# Patient Record
Sex: Male | Born: 1940 | Race: White | Hispanic: No | Smoking: Former smoker
Health system: Southern US, Community
[De-identification: ages and names within clinical notes are randomized; demographics above are authoritative.]

## PROBLEM LIST (undated history)

## (undated) DIAGNOSIS — I255 Ischemic cardiomyopathy: Secondary | ICD-10-CM

## (undated) DIAGNOSIS — I10 Essential (primary) hypertension: Secondary | ICD-10-CM

## (undated) DIAGNOSIS — E039 Hypothyroidism, unspecified: Secondary | ICD-10-CM

## (undated) DIAGNOSIS — Z95810 Presence of automatic (implantable) cardiac defibrillator: Secondary | ICD-10-CM

## (undated) DIAGNOSIS — M1990 Unspecified osteoarthritis, unspecified site: Secondary | ICD-10-CM

## (undated) DIAGNOSIS — Z8719 Personal history of other diseases of the digestive system: Secondary | ICD-10-CM

## (undated) DIAGNOSIS — N189 Chronic kidney disease, unspecified: Secondary | ICD-10-CM

## (undated) DIAGNOSIS — T82198A Other mechanical complication of other cardiac electronic device, initial encounter: Secondary | ICD-10-CM

## (undated) DIAGNOSIS — I679 Cerebrovascular disease, unspecified: Secondary | ICD-10-CM

## (undated) DIAGNOSIS — I480 Paroxysmal atrial fibrillation: Secondary | ICD-10-CM

## (undated) DIAGNOSIS — I5022 Chronic systolic (congestive) heart failure: Secondary | ICD-10-CM

## (undated) DIAGNOSIS — I499 Cardiac arrhythmia, unspecified: Secondary | ICD-10-CM

## (undated) DIAGNOSIS — C4320 Malignant melanoma of unspecified ear and external auricular canal: Secondary | ICD-10-CM

## (undated) DIAGNOSIS — I639 Cerebral infarction, unspecified: Secondary | ICD-10-CM

## (undated) DIAGNOSIS — R001 Bradycardia, unspecified: Secondary | ICD-10-CM

## (undated) DIAGNOSIS — I2510 Atherosclerotic heart disease of native coronary artery without angina pectoris: Secondary | ICD-10-CM

## (undated) DIAGNOSIS — IMO0001 Reserved for inherently not codable concepts without codable children: Secondary | ICD-10-CM

## (undated) DIAGNOSIS — I219 Acute myocardial infarction, unspecified: Secondary | ICD-10-CM

## (undated) DIAGNOSIS — E785 Hyperlipidemia, unspecified: Secondary | ICD-10-CM

## (undated) DIAGNOSIS — I739 Peripheral vascular disease, unspecified: Secondary | ICD-10-CM

## (undated) DIAGNOSIS — D759 Disease of blood and blood-forming organs, unspecified: Secondary | ICD-10-CM

## (undated) DIAGNOSIS — I472 Ventricular tachycardia, unspecified: Secondary | ICD-10-CM

## (undated) DIAGNOSIS — G4730 Sleep apnea, unspecified: Secondary | ICD-10-CM

## (undated) HISTORY — PX: CAROTID ENDARTERECTOMY: SUR193

## (undated) HISTORY — DX: Other mechanical complication of other cardiac electronic device, initial encounter: T82.198A

## (undated) HISTORY — DX: Disease of blood and blood-forming organs, unspecified: D75.9

## (undated) HISTORY — DX: Bradycardia, unspecified: R00.1

## (undated) HISTORY — PX: BACK SURGERY: SHX140

## (undated) HISTORY — DX: Ischemic cardiomyopathy: I25.5

## (undated) HISTORY — DX: Essential (primary) hypertension: I10

## (undated) HISTORY — PX: LUMBAR DISC SURGERY: SHX700

## (undated) HISTORY — DX: Unspecified osteoarthritis, unspecified site: M19.90

## (undated) HISTORY — PX: REVASCULARIZATION / IN-SITU GRAFT LEG: SUR1270

## (undated) HISTORY — PX: CARDIAC CATHETERIZATION: SHX172

## (undated) HISTORY — PX: CARDIAC DEFIBRILLATOR PLACEMENT: SHX171

## (undated) HISTORY — DX: Hyperlipidemia, unspecified: E78.5

## (undated) HISTORY — DX: Chronic systolic (congestive) heart failure: I50.22

## (undated) HISTORY — DX: Hypothyroidism, unspecified: E03.9

## (undated) HISTORY — PX: CORONARY ANGIOPLASTY WITH STENT PLACEMENT: SHX49

## (undated) HISTORY — PX: EYE SURGERY: SHX253

## (undated) HISTORY — DX: Ventricular tachycardia, unspecified: I47.20

## (undated) HISTORY — DX: Peripheral vascular disease, unspecified: I73.9

## (undated) HISTORY — DX: Ventricular tachycardia: I47.2

## (undated) HISTORY — DX: Cerebrovascular disease, unspecified: I67.9

## (undated) HISTORY — DX: Atherosclerotic heart disease of native coronary artery without angina pectoris: I25.10

## (undated) HISTORY — DX: Cerebral infarction, unspecified: I63.9

## (undated) HISTORY — DX: Acute myocardial infarction, unspecified: I21.9

---
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
## 1959-07-07 HISTORY — PX: TUMOR EXCISION: SHX421

## 1989-07-06 HISTORY — PX: ELBOW ARTHROSCOPY: SHX614

## 1989-07-06 HISTORY — PX: KNEE ARTHROSCOPY: SHX127

## 1989-07-06 HISTORY — PX: HEEL SPUR SURGERY: SHX665

## 1990-11-05 HISTORY — PX: CORONARY ANGIOPLASTY: SHX604

## 1998-11-05 HISTORY — PX: CATARACT EXTRACTION W/ INTRAOCULAR LENS  IMPLANT, BILATERAL: SHX1307

## 2001-11-05 HISTORY — PX: RENAL ARTERY BYPASS: SHX2318

## 2002-03-25 ENCOUNTER — Encounter: Payer: Self-pay | Admitting: Vascular Surgery

## 2002-03-26 ENCOUNTER — Encounter: Payer: Self-pay | Admitting: Nephrology

## 2002-03-26 ENCOUNTER — Ambulatory Visit (HOSPITAL_COMMUNITY): Admission: RE | Admit: 2002-03-26 | Discharge: 2002-03-27 | Payer: Self-pay | Admitting: Vascular Surgery

## 2002-04-06 ENCOUNTER — Encounter: Payer: Self-pay | Admitting: Vascular Surgery

## 2002-04-06 ENCOUNTER — Encounter: Admission: RE | Admit: 2002-04-06 | Discharge: 2002-04-06 | Payer: Self-pay | Admitting: Vascular Surgery

## 2002-04-22 ENCOUNTER — Inpatient Hospital Stay (HOSPITAL_COMMUNITY): Admission: AD | Admit: 2002-04-22 | Discharge: 2002-04-24 | Payer: Self-pay | Admitting: Cardiology

## 2002-05-03 ENCOUNTER — Encounter (INDEPENDENT_AMBULATORY_CARE_PROVIDER_SITE_OTHER): Payer: Self-pay | Admitting: *Deleted

## 2002-05-03 ENCOUNTER — Encounter: Payer: Self-pay | Admitting: Vascular Surgery

## 2002-05-03 ENCOUNTER — Inpatient Hospital Stay (HOSPITAL_COMMUNITY): Admission: RE | Admit: 2002-05-03 | Discharge: 2002-05-24 | Payer: Self-pay | Admitting: Vascular Surgery

## 2002-05-04 ENCOUNTER — Encounter: Payer: Self-pay | Admitting: Vascular Surgery

## 2002-05-05 ENCOUNTER — Encounter: Payer: Self-pay | Admitting: Vascular Surgery

## 2002-05-05 HISTORY — PX: FEMORAL-POPLITEAL BYPASS GRAFT: SHX937

## 2002-05-06 ENCOUNTER — Encounter: Payer: Self-pay | Admitting: Vascular Surgery

## 2002-05-19 ENCOUNTER — Encounter: Payer: Self-pay | Admitting: Vascular Surgery

## 2004-02-17 ENCOUNTER — Inpatient Hospital Stay (HOSPITAL_COMMUNITY): Admission: AD | Admit: 2004-02-17 | Discharge: 2004-02-17 | Payer: Self-pay | Admitting: Internal Medicine

## 2005-08-21 ENCOUNTER — Ambulatory Visit: Payer: Self-pay | Admitting: Internal Medicine

## 2005-11-23 ENCOUNTER — Ambulatory Visit: Payer: Self-pay | Admitting: Internal Medicine

## 2006-02-19 ENCOUNTER — Ambulatory Visit: Payer: Self-pay | Admitting: Internal Medicine

## 2006-04-08 ENCOUNTER — Ambulatory Visit: Payer: Self-pay

## 2006-07-10 ENCOUNTER — Ambulatory Visit: Payer: Self-pay | Admitting: Internal Medicine

## 2006-12-17 ENCOUNTER — Ambulatory Visit: Payer: Self-pay | Admitting: Internal Medicine

## 2007-03-11 ENCOUNTER — Ambulatory Visit: Payer: Self-pay | Admitting: Internal Medicine

## 2007-05-27 ENCOUNTER — Ambulatory Visit: Payer: Self-pay | Admitting: Vascular Surgery

## 2007-05-30 ENCOUNTER — Ambulatory Visit: Payer: Self-pay | Admitting: Vascular Surgery

## 2007-05-30 ENCOUNTER — Encounter: Payer: Self-pay | Admitting: Vascular Surgery

## 2007-05-30 ENCOUNTER — Inpatient Hospital Stay (HOSPITAL_COMMUNITY): Admission: RE | Admit: 2007-05-30 | Discharge: 2007-06-01 | Payer: Self-pay | Admitting: Vascular Surgery

## 2007-06-17 ENCOUNTER — Ambulatory Visit: Payer: Self-pay | Admitting: Vascular Surgery

## 2007-06-26 ENCOUNTER — Ambulatory Visit: Payer: Self-pay | Admitting: Internal Medicine

## 2007-09-09 ENCOUNTER — Ambulatory Visit: Payer: Self-pay | Admitting: Internal Medicine

## 2007-11-24 ENCOUNTER — Ambulatory Visit: Payer: Self-pay | Admitting: Vascular Surgery

## 2007-12-12 ENCOUNTER — Ambulatory Visit: Payer: Self-pay | Admitting: Internal Medicine

## 2008-03-09 ENCOUNTER — Ambulatory Visit: Payer: Self-pay | Admitting: Internal Medicine

## 2008-05-24 ENCOUNTER — Ambulatory Visit: Payer: Self-pay | Admitting: Vascular Surgery

## 2008-06-08 ENCOUNTER — Ambulatory Visit: Payer: Self-pay | Admitting: Internal Medicine

## 2008-09-07 ENCOUNTER — Ambulatory Visit: Payer: Self-pay | Admitting: Internal Medicine

## 2008-11-23 ENCOUNTER — Ambulatory Visit: Payer: Self-pay | Admitting: Vascular Surgery

## 2008-12-10 ENCOUNTER — Encounter (INDEPENDENT_AMBULATORY_CARE_PROVIDER_SITE_OTHER): Payer: Self-pay | Admitting: *Deleted

## 2009-03-11 ENCOUNTER — Encounter: Payer: Self-pay | Admitting: Internal Medicine

## 2009-03-14 ENCOUNTER — Ambulatory Visit: Payer: Self-pay | Admitting: Internal Medicine

## 2009-03-18 ENCOUNTER — Encounter: Payer: Self-pay | Admitting: Internal Medicine

## 2009-05-05 ENCOUNTER — Encounter: Payer: Self-pay | Admitting: Internal Medicine

## 2009-05-23 ENCOUNTER — Ambulatory Visit: Payer: Self-pay | Admitting: Vascular Surgery

## 2009-06-14 ENCOUNTER — Ambulatory Visit: Payer: Self-pay | Admitting: Internal Medicine

## 2009-06-20 ENCOUNTER — Encounter: Payer: Self-pay | Admitting: Internal Medicine

## 2009-09-01 ENCOUNTER — Ambulatory Visit: Payer: Self-pay | Admitting: Internal Medicine

## 2009-09-07 ENCOUNTER — Ambulatory Visit: Payer: Self-pay | Admitting: Internal Medicine

## 2009-09-07 DIAGNOSIS — N259 Disorder resulting from impaired renal tubular function, unspecified: Secondary | ICD-10-CM | POA: Insufficient documentation

## 2009-09-07 DIAGNOSIS — I472 Ventricular tachycardia: Secondary | ICD-10-CM | POA: Insufficient documentation

## 2009-09-15 ENCOUNTER — Encounter: Admission: RE | Admit: 2009-09-15 | Discharge: 2009-09-15 | Payer: Self-pay | Admitting: Cardiology

## 2009-09-16 ENCOUNTER — Ambulatory Visit: Payer: Self-pay | Admitting: Internal Medicine

## 2009-09-16 ENCOUNTER — Ambulatory Visit (HOSPITAL_COMMUNITY): Admission: RE | Admit: 2009-09-16 | Discharge: 2009-09-16 | Payer: Self-pay | Admitting: Cardiology

## 2009-10-03 ENCOUNTER — Telehealth: Payer: Self-pay | Admitting: Internal Medicine

## 2009-10-19 HISTORY — PX: CHOLECYSTECTOMY: SHX55

## 2009-11-05 HISTORY — PX: DOPPLER ECHOCARDIOGRAPHY: SHX263

## 2009-11-05 HISTORY — PX: FEMORAL-POPLITEAL BYPASS GRAFT: SHX937

## 2009-11-07 ENCOUNTER — Telehealth: Payer: Self-pay | Admitting: Internal Medicine

## 2009-11-07 ENCOUNTER — Encounter: Payer: Self-pay | Admitting: Internal Medicine

## 2009-11-29 ENCOUNTER — Ambulatory Visit: Payer: Self-pay | Admitting: Vascular Surgery

## 2009-12-13 ENCOUNTER — Ambulatory Visit: Payer: Self-pay | Admitting: Internal Medicine

## 2010-01-10 ENCOUNTER — Ambulatory Visit: Payer: Self-pay | Admitting: Vascular Surgery

## 2010-01-11 ENCOUNTER — Ambulatory Visit (HOSPITAL_COMMUNITY): Admission: RE | Admit: 2010-01-11 | Discharge: 2010-01-11 | Payer: Self-pay | Admitting: Vascular Surgery

## 2010-01-11 ENCOUNTER — Ambulatory Visit: Payer: Self-pay | Admitting: Vascular Surgery

## 2010-01-13 ENCOUNTER — Inpatient Hospital Stay (HOSPITAL_COMMUNITY): Admission: RE | Admit: 2010-01-13 | Discharge: 2010-01-17 | Payer: Self-pay | Admitting: Vascular Surgery

## 2010-01-16 ENCOUNTER — Encounter: Payer: Self-pay | Admitting: Vascular Surgery

## 2010-02-14 ENCOUNTER — Ambulatory Visit: Payer: Self-pay | Admitting: Vascular Surgery

## 2010-03-14 ENCOUNTER — Ambulatory Visit: Payer: Self-pay | Admitting: Internal Medicine

## 2010-03-21 ENCOUNTER — Encounter: Payer: Self-pay | Admitting: Internal Medicine

## 2010-06-27 ENCOUNTER — Ambulatory Visit: Payer: Self-pay | Admitting: Internal Medicine

## 2010-06-27 ENCOUNTER — Ambulatory Visit: Payer: Self-pay | Admitting: Vascular Surgery

## 2010-08-29 ENCOUNTER — Ambulatory Visit: Payer: Self-pay | Admitting: Vascular Surgery

## 2010-09-21 ENCOUNTER — Ambulatory Visit: Payer: Self-pay | Admitting: Cardiology

## 2010-09-21 ENCOUNTER — Encounter: Payer: Self-pay | Admitting: Internal Medicine

## 2010-09-28 ENCOUNTER — Ambulatory Visit: Payer: Self-pay | Admitting: Internal Medicine

## 2010-10-12 ENCOUNTER — Ambulatory Visit: Payer: Self-pay | Admitting: Vascular Surgery

## 2010-10-19 ENCOUNTER — Encounter (INDEPENDENT_AMBULATORY_CARE_PROVIDER_SITE_OTHER): Payer: Self-pay | Admitting: *Deleted

## 2010-10-24 ENCOUNTER — Ambulatory Visit: Payer: Self-pay | Admitting: Internal Medicine

## 2010-10-26 ENCOUNTER — Encounter: Payer: Self-pay | Admitting: Internal Medicine

## 2010-10-26 ENCOUNTER — Telehealth (INDEPENDENT_AMBULATORY_CARE_PROVIDER_SITE_OTHER): Payer: Self-pay | Admitting: *Deleted

## 2010-10-26 ENCOUNTER — Ambulatory Visit: Payer: Self-pay

## 2010-10-26 ENCOUNTER — Inpatient Hospital Stay (HOSPITAL_COMMUNITY)
Admission: AD | Admit: 2010-10-26 | Discharge: 2010-10-30 | Payer: Self-pay | Attending: Internal Medicine | Admitting: Internal Medicine

## 2010-10-26 LAB — CONVERTED CEMR LAB
CO2: 26 meq/L (ref 19–32)
Glucose, Bld: 94 mg/dL (ref 70–99)
Magnesium: 2.3 mg/dL (ref 1.5–2.5)
Troponin I: 0.04 ng/mL (ref <0.06)

## 2010-10-29 ENCOUNTER — Encounter: Payer: Self-pay | Admitting: Cardiology

## 2010-11-01 ENCOUNTER — Ambulatory Visit: Payer: Self-pay | Admitting: Internal Medicine

## 2010-11-01 ENCOUNTER — Telehealth: Payer: Self-pay | Admitting: Internal Medicine

## 2010-11-05 HISTORY — PX: CARDIAC ELECTROPHYSIOLOGY STUDY AND ABLATION: SHX1294

## 2010-11-13 ENCOUNTER — Ambulatory Visit: Payer: Self-pay | Admitting: Cardiology

## 2010-11-14 ENCOUNTER — Ambulatory Visit (HOSPITAL_COMMUNITY)
Admission: RE | Admit: 2010-11-14 | Discharge: 2010-11-14 | Payer: Self-pay | Source: Home / Self Care | Attending: Cardiology | Admitting: Cardiology

## 2010-11-14 ENCOUNTER — Encounter: Payer: Self-pay | Admitting: Cardiology

## 2010-11-14 LAB — PULMONARY FUNCTION TEST

## 2010-11-21 ENCOUNTER — Ambulatory Visit
Admission: RE | Admit: 2010-11-21 | Discharge: 2010-11-21 | Payer: Self-pay | Source: Home / Self Care | Attending: Internal Medicine | Admitting: Internal Medicine

## 2010-11-21 ENCOUNTER — Encounter: Payer: Self-pay | Admitting: Internal Medicine

## 2010-12-05 NOTE — Assessment & Plan Note (Signed)
Summary: pc2      Allergies Added:   Referring Provider:  [place]  CC:  pc2.  History of Present Illness: Ms. 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 He is status post ICD implantation and received a number of discharges in the fall.   He underwent catheterization in November 2010 demonstrated 1. Single-vessel occlusive atherosclerotic coronary artery disease       involving the right coronary artery.   2. Severe left ventricular dysfunction.  augmented beta blocker therapy was complicated by bradycardia. We ended up using sotalol and he has done quite well. He has no limitations in terms of chest pain or shortness of breath. He underwent femoropopliteal in the last 6 months which helped him this via the angulated without claudication    Current Medications (verified): 1)  Sotalol Hcl 120 Mg Tabs (Sotalol Hcl) .[name].. 1 Tablet Two Times A Day 2)  Plavix 75 Mg Tabs (Clopidogrel Bisulfate) .... Take One Tablet By Mouth Daily 3)  Lipitor 80 Mg Tabs (Atorvastatin Calcium) .... Take One Tablet By Mouth Daily. 4)  Amlodipine Besylate 5 Mg Tabs (Amlodipine Besylate) .... Take One Tablet By Mouth Daily 5)  Furosemide 20 Mg Tabs (Furosemide) .... Take One Tablet By Mouth Daily. 6)  Aspirin Ec 325 Mg Tbec (Aspirin) .... Take One Tablet By Mouth Daily 7)  Niaspan 1000 Mg Cr-Tabs (Niacin (Antihyperlipidemic)) .... Take 1 and 1/2 Tablet Daily 8)  Multivitamins   Tabs (Multiple Vitamin) .... Take 1 Tablet By Mouth Once A Day 9)  Nitrostat 0.4 Mg Subl (Nitroglycerin) .[name].. 1 Tablet Under Tongue At Onset of Chest Pain; You May Repeat Every 5 Minutes For Up To 3 Doses. 10)  Ramipril 5 Mg Caps (Ramipril) .... Take One Capsule By Mouth Daily 11)  Metoprolol Succinate 25 Mg Xr24h-Tab (Metoprolol Succinate) .... 1/2 By Mouth Daily 12)  Synthroid 25 Mcg Tabs (Levothyroxine Sodium) .... Take 1 Tablet By Mouth Once A Day  Allergies  (verified): 1)  ! Demerol  Past History:  Past Medical History: Last updated: 06/26/2010 coronary artery diseasewith RCA disease and circumflex disease Bradycardia Ventricular tachycardia previously treated with sotalol Peripheral vascular disease history of claudication Hypertension Hyperlipidemia Febrile cytopenia-mild Treated hypothyroidism  Renal insufficiency Severe claudication, right leg secondary to superficial femoral  artery occlusion status post right common femoral to popliteal   above-knee bypass-2011  Past Surgical History: Last updated: 06/26/2010 lower extremity revascularizationwith aortobifem and renal bypass Prior ICD Right common femoral to popliteal-2011  Family History: Last updated: 09/07/2009 Family History of Coronary Artery Disease:   Social History: Last updated: 09/07/2009 Retired  Tobacco Use - No.  Regular Exercise - yes  Vital Signs:  Patient profile:   70 year old male Height:      72 inches Weight:      216 pounds BMI:     29.40 Pulse rate:   51 / minute Pulse rhythm:   regular BP sitting:   117 / 71  (left arm) Cuff size:   regular  Vitals Entered By: Judithe Modest CMA (June 27, 2010 9:58 AM)  Physical Exam  General:  The patient was alert and oriented in no acute distress. HEENT Normal.  Neck veins were flat, carotids were brisk.  Lungs were clear.  Heart sounds were regular without murmurs or gallops.  Abdomen was soft with active bowel sounds. There is no clubbing cyanosis; trace L>r edema Skin Warm and dry     ICD  Specifications Following MD:  Sherryl Manges, MD     Referring MD:  [place] ICD Vendor:  Medtronic     ICD Model Number:  7232     ICD Serial Number:  [id number] ICD DOI:  02/17/2004     ICD Implanting MD:  Sherryl Manges, MD  Lead 1:    Location: RV     DOI: 05/18/2002     Model #: [id number]     Serial #: [id number]     Status: active  Indications::  ICM  Explantation Comments: [facility]  ICD Follow  Up Battery Voltage:  3.02 V     Charge Time:  7.79 seconds     Underlying rhythm:  SR ICD Dependent:  No       ICD Device Measurements Right Ventricle:  Amplitude: 6.4 mV, Impedance: 1048 ohms, Threshold: 1.0 V at 0.3 msec Shock Impedance: 46/63 ohms   Episodes MS Episodes:  0     Shock:  0     ATP:  1     Nonsustained:  15     Atrial Therapies:  0 Ventricular Pacing:  14.4%  Brady Parameters Mode VVI     Lower Rate Limit:  50      Tachy Zones VF:  188     VT:  214 FVT VIA VF     VT1:  150     Next Remote Date:  09/28/2010     Next Cardiology Appt Due:  06/06/2011 Tech Comments:  15 NST EPISODES AND 1 VT EPISODE ON 03-16-10 W/SUCCESSFUL ATP THERAPY.  NORMAL DEVICE FUNCTION.  NO CHANGES MADE.  [facility] CHECK 09-28-10. 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 1 tablet two times a day    Plavix 75 Mg Tabs (Clopidogrel bisulfate) .[name]... Take one tablet by mouth daily    Amlodipine Besylate 5 Mg Tabs (Amlodipine besylate) .[name]... Take one tablet by mouth daily    Furosemide 20 Mg Tabs (Furosemide) .[name]... Take one tablet by mouth daily.    Aspirin Ec 325 Mg Tbec (Aspirin) .[name]... Take one tablet by mouth daily    Nitrostat 0.4 Mg Subl (Nitroglycerin) .[name]... 1 tablet under tongue at onset of chest pain; you may repeat every 5 minutes for up to 3 doses.    Ramipril 5 Mg Caps (Ramipril) .[name]... Take one capsule by mouth daily    Metoprolol Succinate 25 Mg Xr24h-tab (Metoprolol succinate) .[name]... 1/2 by mouth daily  His updated medication list for this problem includes:    Sotalol Hcl 120 Mg Tabs (Sotalol hcl) .[name]... 1 tablet two times a day     Plavix 75 Mg Tabs (Clopidogrel bisulfate) .[name]... Take one tablet by mouth daily    Amlodipine Besylate 5 Mg Tabs (Amlodipine besylate) .[name]... Take one tablet by mouth daily    Furosemide 20 Mg Tabs (Furosemide) .[name]... Take one tablet by mouth daily.    Aspirin Ec 325 Mg Tbec (Aspirin) .[name]... Take one tablet by mouth daily    Nitrostat 0.4 Mg Subl (Nitroglycerin) .[name]... 1 tablet under tongue at onset of chest pain; you may repeat every 5 minutes for up to 3 doses.  Ramipril 5 Mg Caps (Ramipril) .[name]... Take one capsule by mouth daily    Metoprolol Succinate 25 Mg Xr24h-tab (Metoprolol succinate) .[name]... 1/2 by mouth daily  Problem # 3:  VENTRICULAR TACHYCARDIA (ICD-427.1) Epivir one intercurrent episode of ventricular tachycardia that was terminated successfully;  he will need another cardiogram next visit for his sotalol therapy as well as metabolic profile His updated medication list for this problem includes:    Sotalol Hcl 120 Mg Tabs (Sotalol hcl) .[name]... 1 tablet two times a day    Plavix 75 Mg Tabs (Clopidogrel bisulfate) .[name]... Take one tablet by mouth daily    Amlodipine Besylate 5 Mg Tabs (Amlodipine besylate) .[name]... Take one tablet by mouth daily    Aspirin Ec 325 Mg Tbec (Aspirin) .[name]... Take one tablet by mouth daily    Nitrostat 0.4 Mg Subl (Nitroglycerin) .[name]... 1 tablet under tongue at onset of chest pain; you may repeat every 5 minutes for up to 3 doses.    Ramipril 5 Mg Caps (Ramipril) .[name]... Take one capsule by mouth daily    Metoprolol Succinate 25 Mg Xr24h-tab (Metoprolol succinate) .[name]... 1/2 by mouth daily  His updated medication list for this problem includes:    Sotalol Hcl 120 Mg Tabs (Sotalol hcl) .[name]... 1 tablet two times a day    Plavix 75 Mg Tabs (Clopidogrel bisulfate) .[name]... Take one tablet by mouth daily    Amlodipine Besylate 5 Mg Tabs (Amlodipine besylate) .[name]... Take one tablet by mouth daily    Aspirin Ec 325 Mg Tbec (Aspirin) .[name]... Take one tablet by mouth daily     Nitrostat 0.4 Mg Subl (Nitroglycerin) .[name]... 1 tablet under tongue at onset of chest pain; you may repeat every 5 minutes for up to 3 doses.    Ramipril 5 Mg Caps (Ramipril) .[name]... Take one capsule by mouth daily    Metoprolol Succinate 25 Mg Xr24h-tab (Metoprolol succinate) .[name]... 1/2 by mouth daily  Patient Instructions: 1)  Your physician recommends that you schedule a follow-up appointment in ONE YEAR.  2)  Your physician recommends that you continue on your current medications as directed. Please refer to the Current Medication list given to you today.

## 2010-12-05 NOTE — Cardiovascular Report (Signed)
Summary: Office Visit   Office Visit   Imported By: Roderic Ovens 12/23/2009 13:14:14  _____________________________________________________________________  External Attachment:    Type:   Image     Comment:   External Document

## 2010-12-05 NOTE — Cardiovascular Report (Signed)
Summary: Office Visit Remote   Office Visit Remote   Imported By: Roderic Ovens 03/22/2010 10:56:10  _____________________________________________________________________  External Attachment:    Type:   Image     Comment:   External Document

## 2010-12-05 NOTE — Progress Notes (Signed)
Summary: CALLING ABOUT PACER PHONE  CHECK   Phone Note Call from Patient Call back at Home Phone [phone]   Caller: Patient Summary of Call: PT CALLING REGARDING PACER PHONE CHECK Initial call taken by: Judie Grieve,  November 07, 2009 11:30 AM  Follow-up for Phone Call        Pt stated Dr Graciela Husbands had wanted him to send a [facility] to check on VT burden, he will send transmission today, will follow up with pt this afternoon. Gypsy Balsam RN BSN  November 07, 2009 12:00 PM   Additional Follow-up for Phone Call Additional follow up Details #1::        Reviewed [facility] transmission, 3 episodes of VT, all terminated with ATP, on 11-17, 11-24, and 12-13.  Plan per SK.  Will call pt after Dr Graciela Husbands reviews transmission. Gypsy Balsam RN BSN  November 07, 2009 2:49 PM  Per Dr Graciela Husbands, pt needs appt in about 6 weeks, appt made for 12-13-09 with Dr Graciela Husbands.  wife aware and agrees with plan. Gypsy Balsam RN BSN  November 10, 2009 8:39 AM

## 2010-12-05 NOTE — Cardiovascular Report (Signed)
Summary: Office Visit Remote   Office Visit Remote   Imported By: Roderic Ovens 11/16/2009 15:02:21  _____________________________________________________________________  External Attachment:    Type:   Image     Comment:   External Document

## 2010-12-05 NOTE — Assessment & Plan Note (Signed)
Summary: 1 year rov  Medications Added SOTALOL HCL 120 MG TABS (SOTALOL HCL) 1 tablet two times a day METOPROLOL SUCCINATE 25 MG XR24H-TAB (METOPROLOL SUCCINATE) 1/2 by mouth daily      Allergies Added: ! DEMEROL  Referring Provider:  [place]   History of Present Illness: Ms. 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 He is status post ICD implantation and received a number of discharges in the fall. He underwent catheterization in November demonstrated 1. Single-vessel occlusive atherosclerotic coronary artery disease       involving the right coronary artery.   2. Severe left ventricular dysfunction.   Augmentin beta blockers were attempted. This turned out to result in bradycardia and we don't titrate his metoprolol reinitiated his sotalol and he has done pretty well since then. He denies intercurrent tachypalpitations. His exercise tolerance is stable.  I should note that he has scheduled intervention on his right leg for peripheral vascular disease which is associated with claudication.  Current Medications (verified): 1)  Sotalol Hcl 120 Mg Tabs (Sotalol Hcl) .[name].. 1 and 1/2 By Mouth Two Times A Day 2)  Plavix 75 Mg Tabs (Clopidogrel Bisulfate) .... Take One Tablet By Mouth Daily 3)  Lipitor 80 Mg Tabs (Atorvastatin Calcium) .... Take One Tablet By Mouth Daily. 4)  Amlodipine Besylate 5 Mg Tabs (Amlodipine Besylate) .... Take One Tablet By Mouth Daily 5)  Furosemide 20 Mg Tabs (Furosemide) .... Take One Tablet By Mouth Daily. 6)  Aspirin Ec 325 Mg Tbec (Aspirin) .... Take One Tablet By Mouth Daily 7)  Niaspan 1000 Mg Cr-Tabs (Niacin (Antihyperlipidemic)) .... Take 1 and 1/2 Tablet Daily 8)  Multivitamins   Tabs (Multiple Vitamin) .... Take 1 Tablet By Mouth Once A Day 9)  Nitrostat 0.4 Mg Subl (Nitroglycerin) .[name].. 1 Tablet Under Tongue At Onset of Chest Pain; You May Repeat Every 5 Minutes For Up To 3 Doses. 10)   Ramipril 5 Mg Caps (Ramipril) .... Take One Capsule By Mouth Daily 11)  Metoprolol Succinate 25 Mg Xr24h-Tab (Metoprolol Succinate) .... 1/2 By Mouth Daily 12)  Synthroid 25 Mcg Tabs (Levothyroxine Sodium) .... Take 1 Tablet By Mouth Once A Day  Allergies (verified): 1)  ! Demerol  Past History:  Past Medical History: Last updated: 09/07/2009 coronary artery diseasewith RCA disease and circumflex disease Bradycardia Ventricular tachycardia previously treated with sotalol Peripheral vascular disease history of claudication Hypertension Hyperlipidemia Febrile cytopenia-mild Treated hypothyroidism  Renal insufficiency  Vital Signs:  Patient profile:   70 year old male Height:      72 inches Weight:      216 pounds BMI:     29.40 Pulse rate:   80 / minute Resp:     16 per minute BP sitting:   110 / 70  (right arm)  Vitals Entered By: Marrion Coy, CNA (December 13, 2009 2:53 PM)  Physical Exam  General:  The patient was alert and oriented in no acute distress. HEENT Normal.  Neck veins were flat, carotids were brisk. A. scar is noted over his left carotid area Lungs were clear.  Heart sounds were regular without murmurs or gallops.  Abdomen was soft with active bowel sounds. There is no clubbing cyanosis or edema. Skin Warm and dry      ICD Specifications Following MD:  Sherryl Manges, MD     Referring MD:  [place] ICD Vendor:  [employer]     ICD Model Number:  [id number]  ICD Serial Number:  [id number] ICD DOI:  02/17/2004     ICD Implanting MD:  Sherryl Manges, MD  Lead 1:    Location: RV     DOI: 05/18/2002     Model #: [id number]     Serial #: [id number]     Status: active  Indications::  ICM  Explantation Comments: [facility]  ICD Follow Up Remote Check?  No Battery Voltage:  3.04 V     Charge Time:  7.62 seconds     Underlying rhythm:  SB WITH FREQUENT VENTRICULAR ECTOPY ICD Dependent:  No       ICD Device Measurements Right Ventricle:  Amplitude: 14.1 mV, Impedance:  760 ohms, Threshold: 1.0 V at 0.4 msec Shock Impedance: 39/48 ohms   Episodes Shock:  0     ATP:  4     Nonsustained:  13     Ventricular Pacing:  10.4%  Brady Parameters Mode VVI     Lower Rate Limit:  50      Tachy Zones VF:  188     VT:  214 FVT VIA VF     VT1:  150     Next Remote Date:  03/14/2010     Next Cardiology Appt Due:  12/06/2010 Tech Comments:  Normal device function.  1 more episode of ATP since spoke with patient last.  1 episode of NSVT was classified as SVT (43 beat run), sudden onset, morphology similar to sinus.  Therapy withheld because of wavelet.  Wavelet turned to monitor today.  NID's extended.  Pt does [facility] transmissions.  ROV 12 months SK. Gypsy Balsam RN BSN  December 13, 2009 3:07 PM   Impression & Recommendations:  Problem # 1:  IMPLANTABLE  DEFIBRILLATOR  DDD MDT (ICD-V45.02) Device parameters and data were reviewed . we changed the detection intervals to try to reduce the likelihood of inappropriate therapy as well as reprogrammed wavelet to try and reduce failure detect.    interrogation demonstrated ventricular tachycardia decreasing frequency. There was one episode where I think weight blood with health therapy was probably a ventricular tachycardia that was nonsustained.  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    Patient Instructions: 1)  Your physician wants you to follow-up in: 6months with Dr Graciela Husbands.  You will receive a reminder letter in the mail two months in advance. If you don't receive a letter, please call our office to schedule the follow-up appointment. Prescriptions: METOPROLOL SUCCINATE 25  MG XR24H-TAB (METOPROLOL SUCCINATE) 1/2 by mouth daily  #15 x 11   Entered by:   Optometrist BSN   Authorized by:   Nathen May, MD, [facility]   Signed by:   Gypsy Balsam RN BSN on 12/13/2009   Method used:   Electronically to        [facility] (retail)       [address]       [place], [place]  [address]       Ph: [phone]       Fax: [phone]   RxID:   [id number] SOTALOL HCL 120 MG TABS (SOTALOL HCL)  1 tablet two times a day  #60 x 11   Entered by:   Optometrist BSN   Authorized by:   Nathen May, MD, [facility]   Signed by:   Gypsy Balsam RN BSN on 12/13/2009   Method used:   Electronically to        [facility] (retail)       [address]       [place], [place]  [address]       Ph: [phone]       Fax: [phone]   RxID:   [id number]   Appended Document:  Cardiology      Allergies: 1)  ! Demerol   EKG  Procedure date:  12/13/2009  Findings:      sinus rhythm with frequent atrial and probably ventricular ectopics occasional ventricular pacing is seen   ICD Specifications Following MD:  Sherryl Manges, MD     Referring MD:  [place] ICD Vendor:  [employer]     ICD Model Number:  7232     ICD Serial Number:  [id number] ICD DOI:  02/17/2004     ICD Implanting MD:  Sherryl Manges, MD  Lead 1:    Location: RV     DOI: 05/18/2002     Model #: [id number]     Serial #: [id number]     Status: active  Indications::  ICM  Explantation Comments: [facility]  ICD Follow Up ICD Dependent:  No      Brady Parameters Mode VVI     Lower Rate Limit:  50      Tachy Zones VF:  188     VT:  214 FVT VIA VF     VT1:  150

## 2010-12-05 NOTE — Letter (Signed)
Summary: Remote Device Check  [facility], Main Office  [address]   [place], [place] [address]   Phone: [phone]  Fax: [phone]     Mar 21, 2010 MRN: [id number]   [name] [address] [place], [place]  [address]   Dear [name],   Your remote transmission was recieved and reviewed by your physician.  All diagnostics were within normal limits for you.   __X____Your next office visit is scheduled for: August 2011. Please call our office to schedule an appointment.    Sincerely,  Vella Kohler

## 2010-12-07 NOTE — Cardiovascular Report (Signed)
Summary: Office Visit Remote   Office Visit Remote   Imported By: Roderic Ovens 10/20/2010 09:50:16  _____________________________________________________________________  External Attachment:    Type:   Image     Comment:   External Document

## 2010-12-07 NOTE — Progress Notes (Signed)
Summary: device went off/2nd call   Phone Note Call from Patient Call back at Home Phone [phone] Call back at [phone]   Caller: Spouse/marie Reason for Call: Talk to Nurse Summary of Call: pt wife states pt device went off and would like to talk to someone. Initial call taken by: Roe Coombs,  October 26, 2010 9:04 AM  Follow-up for Phone Call        per [place] who took the message, ICD fired once and pt on way to office. Donnald Garre.  Follow-up by: Claris Gladden RN,  October 26, 2010 10:24 AM  Additional Follow-up for Phone Call Additional follow up Details #1::        patient seen in office today

## 2010-12-07 NOTE — Letter (Signed)
Summary: Remote Device Check  [facility], Main Office  [address]   [place], [place] [address]   Phone: [phone]  Fax: [phone]     October 19, 2010 MRN: [id number]   SHERIFF RODENBERG [address] [place], [place]  [address]   Dear Mr. Marion,   Your remote transmission was recieved and reviewed by your physician.  All diagnostics were within normal limits for you.  __X___Your next transmission is scheduled for: 12/28/2010.    Please transmit at any time this day.  If you have a wireless device your transmission will be sent automatically.  ______Your next office visit is scheduled for:                              . Please call our office to schedule an appointment.    Sincerely,  Altha Harm, LPN

## 2010-12-07 NOTE — Assessment & Plan Note (Signed)
Summary: EPH. APPT IS 2:45. GD  Medications Added SYNTHROID 50 MCG TABS (LEVOTHYROXINE SODIUM) 1 by mouth daily AMIODARONE HCL 200 MG TABS (AMIODARONE HCL) 2 pills daily      Allergies Added:   Referring Provider:  [place]   History of Present Illness: Ms. 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 He is status post ICD implantation and received a number of discharges in the fall of 2010   He underwent catheterization in November 2010 demonstrated 1. Single-vessel occlusive atherosclerotic coronary artery disease       involving the right coronary artery.   2. Severe left ventricular dysfunction.  augmented beta blocker therapy was complicated by bradycardia. We ended up using sotalol and he has done quite well. He has no limitations in terms of chest pain or shortness of breath.    in early December he had recurrent fibula or discharges and was admitted to hospital. He underwent catheterization and subsequent LAD stenting. His blockers and sotalol were discontinued and amiodarone was initiated.  he is a relatively well since then. Is tolerating the amiodarone GI discomfort or neurological side effects he is looking forward to playing golf   Current Medications (verified): 1)  Plavix 75 Mg Tabs (Clopidogrel Bisulfate) .... Take One Tablet By Mouth Daily 2)  Lipitor 80 Mg Tabs (Atorvastatin Calcium) .... Take One Tablet By Mouth Daily. 3)  Amlodipine Besylate 5 Mg Tabs (Amlodipine Besylate) .... Take One Tablet By Mouth Daily 4)  Furosemide 20 Mg Tabs (Furosemide) .... Take One Tablet By Mouth Daily. 5)  Aspirin Ec 325 Mg Tbec (Aspirin) .... Take One Tablet By Mouth Daily 6)  Niaspan 1000 Mg Cr-Tabs (Niacin (Antihyperlipidemic)) .... Take 1 and 1/2 Tablet Daily 7)  Multivitamins   Tabs (Multiple Vitamin) .... Take 1 Tablet By Mouth Once A Day 8)  Nitrostat 0.4 Mg Subl (Nitroglycerin) .[name].. 1 Tablet Under Tongue At Onset of  Chest Pain; You May Repeat Every 5 Minutes For Up To 3 Doses. 9)  Ramipril 5 Mg Caps (Ramipril) .... Take One Capsule By Mouth Daily 10)  Synthroid 50 Mcg Tabs (Levothyroxine Sodium) .[name].. 1 By Mouth Daily 11)  Calcium-Magnesium-Zinc 500-250-12.5 Mg Tabs (Calcium-Magnesium-Zinc) .... 2 Tablet Once Daily 12)  Amiodarone Hcl 200 Mg Tabs (Amiodarone Hcl) .... 2 By Mouth Two Times A Day  Allergies (verified): 1)  ! Demerol  Past History:  Past Medical History: Last updated: 06/26/2010 coronary artery diseasewith RCA disease and circumflex disease Bradycardia Ventricular tachycardia previously treated with sotalol Peripheral vascular disease history of claudication Hypertension Hyperlipidemia Febrile cytopenia-mild Treated hypothyroidism  Renal insufficiency Severe claudication, right leg secondary to superficial femoral  artery occlusion status post right common femoral to popliteal   above-knee bypass-2011  Past Surgical History: Last updated: 06/26/2010 lower extremity revascularizationwith aortobifem and renal bypass Prior ICD Right common femoral to popliteal-2011  Family History: Last updated: 09/07/2009 Family History of Coronary Artery Disease:   Social History: Last updated: 09/07/2009 Retired  Tobacco Use - No.  Regular Exercise - yes  Vital Signs:  Patient profile:   70 year old male Height:      72 inches Weight:      214 pounds BMI:     29.13 Pulse rate:   71 / minute Resp:     16 per minute BP sitting:   112 / 76  (right arm)  Vitals Entered By: Marrion Coy, CNA (November 21, 2010 2:44 PM)  Physical Exam  General:  The patient was alert and oriented in no acute distress. HEENT Normal.  Neck veins were flat, carotids were brisk.  Lungs were clear.  Heart sounds were regular without murmurs or gallops.  Abdomen was soft with active bowel sounds. There is no clubbing cyanosis or edema. Skin Warm and dry     ICD Specifications Following MD:   Sherryl Manges, MD     Referring MD:  [place] ICD Vendor:  Medtronic     ICD Model Number:  7232     ICD Serial Number:  [id number] ICD DOI:  02/17/2004     ICD Implanting MD:  Sherryl Manges, MD  Lead 1:    Location: RV     DOI: 05/18/2002     Model #: [id number]     Serial #: [id number]     Status: active  Indications::  ICM  Explantation Comments: [facility]link  ICD Follow Up Remote Check?  No Battery Voltage:  2.95 V     Charge Time:  7.85 seconds     Underlying rhythm:  sr ICD Dependent:  No       ICD Device Measurements Right Ventricle:  Amplitude: 6.9 mV, Impedance: 872 ohms, Threshold: 1.0 V at 0.4 msec Shock Impedance: 40/50 ohms   Episodes Shock:  1     ATP:  1     Nonsustained:  7     Ventricular Pacing:  1.8%  Brady Parameters Mode VVI     Lower Rate Limit:  50      Tachy Zones VF:  188     VT:  214 FVT VIA VF     VT1:  150     Next Remote Date:  05/24/2011     Next Cardiology Appt Due:  11/06/2011 Tech Comments:  No parameter changes.  Device function normal.  7NSVT episodes no EGM's available.  1 VT successfully treated with ATP x1.  Carelink transmissions every 3 months.  ROV 1 year with Dr. Graciela Husbands. Altha Harm, LPN  November 21, 2010 3:10 PM   Impression & Recommendations:  Problem # 1:  VENTRICULAR TACHYCARDIA (ICD-427.1)  tpatient had wanted her current episode of ventricular tachycardia on January 1 treated with ATP. We'll continue him on his amiodarone. Will decrease the dose from 402 times a day Demadex 40 mg once a day.  Orders: EKG w/ Interpretation (93000)  The following medications were removed from the medication list:    Sotalol Hcl 120 Mg Tabs (Sotalol hcl) .[name]... 1 tablet two times a day    Metoprolol Succinate 25 Mg Xr24h-tab (Metoprolol succinate) .[name]... 1/2 by mouth daily His updated medication list for this problem includes:    Plavix 75 Mg Tabs (Clopidogrel bisulfate) .[name]... Take one tablet by mouth daily    Amlodipine Besylate 5 Mg Tabs (Amlodipine besylate)  .[name]... Take one tablet by mouth daily    Aspirin Ec 325 Mg Tbec (Aspirin) .[name]... Take one tablet by mouth daily    Nitrostat 0.4 Mg Subl (Nitroglycerin) .[name]... 1 tablet under tongue at onset of chest pain; you may repeat every 5 minutes for up to 3 doses.    Ramipril 5 Mg Caps (Ramipril) .[name]... Take one capsule by mouth daily    Amiodarone Hcl 200 Mg Tabs (Amiodarone hcl) .[name]... 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 1 tablet two times a day    Metoprolol Succinate 25 Mg Xr24h-tab (Metoprolol succinate) .[name]... 1/2 by mouth daily His updated medication list for this problem includes:    Plavix 75 Mg Tabs (Clopidogrel bisulfate) .[name]... Take one tablet by mouth daily    Amlodipine Besylate 5 Mg Tabs (Amlodipine besylate) .[name]... Take one tablet by mouth daily    Furosemide 20 Mg Tabs (Furosemide) .[name]... Take one tablet by mouth daily.    Aspirin Ec 325 Mg Tbec (Aspirin) .[name]... Take one tablet by mouth daily    Nitrostat 0.4 Mg Subl (Nitroglycerin) .[name]... 1 tablet under tongue at onset of chest pain; you may repeat every 5 minutes for up to 3 doses.    Ramipril 5 Mg Caps (Ramipril) .[name]... Take one capsule by mouth daily    Amiodarone Hcl 200 Mg Tabs (Amiodarone hcl) .[name]... 2 pills daily  Orders: EKG w/ Interpretation (93000)  The following medications were removed from the medication list:    Sotalol Hcl 120 Mg Tabs (Sotalol hcl) .[name]... 1 tablet two times a day    Metoprolol Succinate 25 Mg Xr24h-tab (Metoprolol succinate) .[name]... 1/2 by mouth daily His updated medication list for this problem includes:    Plavix 75 Mg Tabs (Clopidogrel bisulfate) .[name]... Take one tablet by mouth daily    Amlodipine Besylate 5 Mg Tabs (Amlodipine besylate) .[name]... Take one tablet by mouth daily    Furosemide 20 Mg  Tabs (Furosemide) .[name]... Take one tablet by mouth daily.    Aspirin Ec 325 Mg Tbec (Aspirin) .[name]... Take one tablet by mouth daily    Nitrostat 0.4 Mg Subl (Nitroglycerin) .[name]... 1 tablet under tongue at onset of chest pain; you may repeat every 5 minutes for up to 3 doses.    Ramipril 5 Mg Caps (Ramipril) .[name]... Take one capsule by mouth daily    Amiodarone Hcl 200 Mg Tabs (Amiodarone hcl) .[name]... 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  2)  Your physician wants you to follow-up in:2 months   You will receive a reminder letter in the mail two months in advance. If you don't receive a letter, please call our office to schedule the follow-up appointment.

## 2010-12-07 NOTE — Assessment & Plan Note (Signed)
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 He is status post ICD implantation and received a number of discharges in the fall of 2010   He underwent catheterization in November 2010 demonstrated 1. Single-vessel occlusive atherosclerotic coronary artery disease       involving the right coronary artery.   2. Severe left ventricular dysfunction.  augmented beta blocker therapy was complicated by bradycardia. We ended up using sotalol and he has done quite well. He has no limitations in terms of chest pain or shortness of breath.    Two days ago, while sitting, his defibrillator went off associated with presyncope. Last night while a vaginal game it happened again. This morning he had 2 other episodes of presyncope.  Problems Prior to Update: 1)  Implantable Defibrillator Ddd Mdt  (ICD-V45.02) 2)  Renal Insufficiency  (ICD-588.9) 3)  Cardiomyopathy, Ischemic Ef 35"% 8/09  (ICD-414.8) 4)  Sinus Bradycardia  (ICD-427.81) 5)  Ventricular Tachycardia  (ICD-427.1)  Current Problems (verified): 1)  Implantable Defibrillator Ddd Mdt  (ICD-V45.02) 2)  Renal Insufficiency  (ICD-588.9) 3)  Cardiomyopathy, Ischemic Ef 35"% 8/09  (ICD-414.8) 4)  Sinus Bradycardia  (ICD-427.81) 5)  Ventricular Tachycardia  (ICD-427.1)  Medications Prior to Update: 1)  Sotalol Hcl 120 Mg Tabs (Sotalol Hcl) .[name].. 1 Tablet Two Times A Day 2)  Plavix 75 Mg Tabs (Clopidogrel Bisulfate) .... Take One Tablet By Mouth Daily 3)  Lipitor 80 Mg Tabs (Atorvastatin Calcium) .... Take One Tablet By Mouth Daily. 4)  Amlodipine Besylate 5 Mg Tabs (Amlodipine Besylate) .... Take One Tablet By Mouth Daily 5)  Furosemide 20 Mg Tabs (Furosemide) .... Take  One Tablet By Mouth Daily. 6)  Aspirin Ec 325 Mg Tbec (Aspirin) .... Take One Tablet By Mouth Daily 7)  Niaspan 1000 Mg Cr-Tabs (Niacin (Antihyperlipidemic)) .... Take 1 and 1/2 Tablet Daily 8)  Multivitamins   Tabs (Multiple Vitamin) .... Take 1 Tablet By Mouth Once A Day 9)  Nitrostat 0.4 Mg Subl (Nitroglycerin) .[name].. 1 Tablet Under Tongue At Onset of Chest Pain; You May Repeat Every 5 Minutes For Up To 3 Doses. 10)  Ramipril 5 Mg Caps (Ramipril) .... Take One Capsule By Mouth Daily 11)  Metoprolol Succinate 25 Mg Xr24h-Tab (Metoprolol Succinate) .... 1/2 By Mouth Daily 12)  Synthroid 25 Mcg Tabs (Levothyroxine Sodium) .... Take 1 Tablet By Mouth Once A Day  Current Medications (verified): 1)  Sotalol Hcl 120 Mg Tabs (Sotalol Hcl) .[name].. 1 Tablet Two Times A Day 2)  Plavix 75 Mg Tabs (Clopidogrel Bisulfate) .... Take One Tablet By Mouth Daily 3)  Lipitor 80 Mg Tabs (Atorvastatin Calcium) .... Take One Tablet By Mouth Daily. 4)  Amlodipine Besylate 5 Mg Tabs (Amlodipine Besylate) .... Take One Tablet By Mouth Daily 5)  Furosemide 20 Mg Tabs (Furosemide) .... Take One Tablet By Mouth Daily. 6)  Aspirin Ec 325 Mg Tbec (Aspirin) .... Take One Tablet By Mouth Daily 7)  Niaspan 1000 Mg Cr-Tabs (Niacin (Antihyperlipidemic)) .... Take 1 and 1/2 Tablet Daily 8)  Multivitamins   Tabs (Multiple Vitamin) .... Take 1 Tablet By Mouth Once A Day 9)  Nitrostat 0.4 Mg Subl (Nitroglycerin) .[name].. 1 Tablet Under Tongue At Onset of Chest Pain; You May Repeat Every 5 Minutes For Up  To 3 Doses. 10)  Ramipril 5 Mg Caps (Ramipril) .... Take One Capsule By Mouth Daily 11)  Metoprolol Succinate 25 Mg Xr24h-Tab (Metoprolol Succinate) .... 1/2 By Mouth Daily 12)  Synthroid 25 Mcg Tabs (Levothyroxine Sodium) .... Take 1 Tablet By Mouth Once A Day 13)  Calcium-Magnesium-Zinc 500-250-12.5 Mg Tabs (Calcium-Magnesium-Zinc) .... 2 Tablet Once Daily  Allergies (verified): 1)  ! Demerol  Past History:  Past Medical  History: Last updated: 06/26/2010 coronary artery diseasewith RCA disease and circumflex disease Bradycardia Ventricular tachycardia previously treated with sotalol Peripheral vascular disease history of claudication Hypertension Hyperlipidemia Febrile cytopenia-mild Treated hypothyroidism  Renal insufficiency Severe claudication, right leg secondary to superficial femoral  artery occlusion status post right common femoral to popliteal   above-knee bypass-2011  Past Surgical History: Last updated: 06/26/2010 lower extremity revascularizationwith aortobifem and renal bypass Prior ICD Right common femoral to popliteal-2011  Family History: Last updated: 09/07/2009 Family History of Coronary Artery Disease:   Social History: Last updated: 09/07/2009 Retired  Tobacco Use - No.  Regular Exercise - yes  Risk Factors: Exercise: yes (09/07/2009)  Risk Factors: Smoking Status: never (09/07/2009)  Review of Systems       full review of systems was negative apart from a history of present illness and past medical history.   Vital Signs:  Patient profile:   70 year old male Height:      72 inches Weight:      212.25 pounds BMI:     28.89 BP sitting:   113 / 66  (left arm)  Physical Exam  General:  The patient was alert and oriented in no acute distress. HEENT Normal.  Neck veins were flat, carotids were brisk.  Lungs were clear.  Heart sounds were regular without murmurs or gallops.  Abdomen was soft with active bowel sounds. There is no clubbing cyanosis or edema. Skin Warm and dry    EKG  Procedure date:  10/26/2010  Findings:      sinus rhythm at 50 with intermittent ventricular pacing  Inferolateral Q waves   ICD Specifications Following MD:  Sherryl Manges, MD     Referring MD:  [place] ICD Vendor:  [employer]onic     ICD Model Number:  7232     ICD Serial Number:  [id number] ICD DOI:  02/17/2004     ICD Implanting MD:  Sherryl Manges, MD  Lead 1:     Location: RV     DOI: 05/18/2002     Model #: 4098     Serial #: [id number]     Status: active  Indications::  ICM  Explantation Comments: [facility]link  ICD Follow Up ICD Dependent:  No      Brady Parameters Mode VVI     Lower Rate Limit:  50      Tachy Zones VF:  188     VT:  214 FVT VIA VF     VT1:  150     Impression & Recommendations:  Problem # 1:  VENTRICULAR TACHYCARDIA (ICD-427.1) the patient has had recurrent monomorphic as well as polymorphic ventricular tachycardia both prompting ATP which failed and suffered shock therapy which restored sinus rhythm. In addition he has had 2 presyncopal episodes today which should have been detected by his device if they exceeded his rate of 150. However, given the clustering of these events over the last 48 hours, I recommended that we hospitalize and will check electrolytes troponin and anticipate catheterization    Sotalol Hcl 120 Mg Tabs (  Sotalol hcl) .[name]... 1 tablet two times a day    Plavix 75 Mg Tabs (Clopidogrel bisulfate) .[name]... Take one tablet by mouth daily    Amlodipine Besylate 5 Mg Tabs (Amlodipine besylate) .[name]... Take one tablet by mouth daily    Aspirin Ec 325 Mg Tbec (Aspirin) .[name]... Take one tablet by mouth daily    Nitrostat 0.4 Mg Subl (Nitroglycerin) .[name]... 1 tablet under tongue at onset of chest pain; you may repeat every 5 minutes for up to 3 doses.    Ramipril 5 Mg Caps (Ramipril) .[name]... Take one capsule by mouth daily    Metoprolol Succinate 25 Mg Xr24h-tab (Metoprolol succinate) .[name]... 1/2 by mouth daily  Orders: T-Troponin I (84132-44010) TLB-BMP (Basic Metabolic Panel-BMET) (80048-METABOL) TLB-Magnesium (Mg) (83735-MG)  Problem # 2:  SINUS BRADYCARDIA (ICD-427.81) he is ventricularly paced at a rate of 50. It may well be that we will have to adjust his antiarrhythmics. One opportunity would be to stop his sotalol and used dofetilide. 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 We will plan to recheck and he will dehydration prior to his catheterization

## 2010-12-07 NOTE — Procedures (Signed)
Summary: icd remote  425.4      Allergies Added:   Current Medications (verified): 1)  Sotalol Hcl 120 Mg Tabs (Sotalol Hcl) .[name].. 1 Tablet Two Times A Day 2)  Plavix 75 Mg Tabs (Clopidogrel Bisulfate) .... Take One Tablet By Mouth Daily 3)  Lipitor 80 Mg Tabs (Atorvastatin Calcium) .... Take One Tablet By Mouth Daily. 4)  Amlodipine Besylate 5 Mg Tabs (Amlodipine Besylate) .... Take One Tablet By Mouth Daily 5)  Furosemide 20 Mg Tabs (Furosemide) .... Take One Tablet By Mouth Daily. 6)  Aspirin Ec 325 Mg Tbec (Aspirin) .... Take One Tablet By Mouth Daily 7)  Niaspan 1000 Mg Cr-Tabs (Niacin (Antihyperlipidemic)) .... Take 1 and 1/2 Tablet Daily 8)  Multivitamins   Tabs (Multiple Vitamin) .... Take 1 Tablet By Mouth Once A Day 9)  Nitrostat 0.4 Mg Subl (Nitroglycerin) .[name].. 1 Tablet Under Tongue At Onset of Chest Pain; You May Repeat Every 5 Minutes For Up To 3 Doses. 10)  Ramipril 5 Mg Caps (Ramipril) .... Take One Capsule By Mouth Daily 11)  Metoprolol Succinate 25 Mg Xr24h-Tab (Metoprolol Succinate) .... 1/2 By Mouth Daily 12)  Synthroid 25 Mcg Tabs (Levothyroxine Sodium) .... Take 1 Tablet By Mouth Once A Day  Allergies (verified): 1)  ! Demerol   ICD Specifications Following MD:  Sherryl Manges, MD     Referring MD:  [place] ICD Vendor:  [employer]     ICD Model Number:  7232     ICD Serial Number:  [id number] ICD DOI:  02/17/2004     ICD Implanting MD:  Sherryl Manges, MD  Lead 1:    Location: RV     DOI: 05/18/2002     Model #: 9147     Serial #: [id number]     Status: active  Indications::  ICM  Explantation Comments: Carelink  ICD Follow Up Battery Voltage:  2.93 V     Charge Time:  7.85 seconds     Underlying rhythm:  SR ICD Dependent:  No       ICD Device Measurements Right Ventricle:  Amplitude: 6.8 mV, Impedance: 856 ohms,  Shock Impedance: 40/49 ohms   Episodes MS Episodes:  0     Shock:  1     ATP:  4     Nonsustained:  12     Ventricular Pacing:  15.4%  Brady  Parameters Mode VVI     Lower Rate Limit:  50      Tachy Zones VF:  188     VT:  214 FVT VIA VF     VT1:  150     Next Remote Date:  01/25/2011     Next Cardiology Appt Due:  10/08/2011 Tech Comments:  PT SITTING AT WORK AND BECAME LIGHTHEADED AND RECEIVED 1 SHOCK.  PT FELT FINE AFTER SHOCK AND EMS WAS CALLED.  EKG PERFORMED THAT SHOWED SR.  PT CALLED [facility] AND WANTED TO BE SEEN.  NORMAL DEVICE FUNCTION.  TRUE VT EPISODE W/NOT SUCCESSFUL ATP BUT SUCCESSFUL SHOCK.  3 OTHER VT EPISODES SINCE LAST CHECK ON 06-27-10 THAT WERE SUCCESSFULLY PACE TERMINATED.  STRIPS WERE SHOWED TO [facility].  NO CHANGES MADE AT THIS TIME.  STRIPS TO BE SHOWED TO DR Graciela Husbands.  CARELINK 01-25-11 AND ROV W/SK IN AUGUST 2012. Vella Kohler  October 24, 2010 4:52 PM

## 2010-12-07 NOTE — Progress Notes (Signed)
Summary: pt request call   Phone Note Call from Patient Call back at Home Phone [phone]   Caller: Patient Reason for Call: Talk to Nurse Initial call taken by: Judie Grieve,  November 01, 2010 11:19 AM  Follow-up for Phone Call        pt wondering if he can go back to work before seeing Dr. Graciela Husbands on 1/17. 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 adv pt would get back to him.  Follow-up by: Claris Gladden RN,  November 01, 2010 11:35 AM  Additional Follow-up for Phone Call Additional follow up Details #1::        adv pt per dod he needs to wait to be cleared by md on 1/17 before going back to work.  Additional Follow-up by: Claris Gladden RN,  November 01, 2010 5:56 PM

## 2010-12-07 NOTE — Letter (Signed)
Summary: Out of Work  [facility], Main Office  [address]   [place], [place] [address]   Phone: [phone]  Fax: [phone]    November 21, 2010   Employee:  [name]    To Whom It May Concern:   For Medical reasons, please excuse the above named employee from work for the following dates:  Start:   November 07, 2010  End:   November 27, 2010  If you need additional information, please feel free to contact our office.         Sincerely,    Claris Gladden RN

## 2010-12-13 NOTE — Letter (Signed)
Summary: [facility]  [facility]   Imported By: Marylou Mccoy 12/07/2010 09:34:12  _____________________________________________________________________  External Attachment:    Type:   Image     Comment:   External Document

## 2010-12-15 ENCOUNTER — Other Ambulatory Visit (INDEPENDENT_AMBULATORY_CARE_PROVIDER_SITE_OTHER): Payer: BC Managed Care – PPO

## 2010-12-15 DIAGNOSIS — E039 Hypothyroidism, unspecified: Secondary | ICD-10-CM

## 2011-01-09 ENCOUNTER — Other Ambulatory Visit (INDEPENDENT_AMBULATORY_CARE_PROVIDER_SITE_OTHER): Payer: BC Managed Care – PPO

## 2011-01-09 ENCOUNTER — Encounter (INDEPENDENT_AMBULATORY_CARE_PROVIDER_SITE_OTHER): Payer: BC Managed Care – PPO

## 2011-01-09 DIAGNOSIS — I739 Peripheral vascular disease, unspecified: Secondary | ICD-10-CM

## 2011-01-09 DIAGNOSIS — Z48812 Encounter for surgical aftercare following surgery on the circulatory system: Secondary | ICD-10-CM

## 2011-01-15 LAB — CARDIAC PANEL(CRET KIN+CKTOT+MB+TROPI)
CK, MB: 3 ng/mL (ref 0.3–4.0)
CK, MB: 3 ng/mL (ref 0.3–4.0)
Relative Index: INVALID (ref 0.0–2.5)
Total CK: 67 U/L (ref 7–232)
Total CK: 82 U/L (ref 7–232)

## 2011-01-15 LAB — BASIC METABOLIC PANEL
BUN: 21 mg/dL (ref 6–23)
CO2: 22 mEq/L (ref 19–32)
CO2: 27 mEq/L (ref 19–32)
Calcium: 8.8 mg/dL (ref 8.4–10.5)
Chloride: 105 mEq/L (ref 96–112)
Chloride: 108 mEq/L (ref 96–112)
Creatinine, Ser: 1.51 mg/dL — ABNORMAL HIGH (ref 0.4–1.5)
Creatinine, Ser: 1.56 mg/dL — ABNORMAL HIGH (ref 0.4–1.5)
GFR calc Af Amer: 53 mL/min — ABNORMAL LOW (ref >60)
GFR calc Af Amer: >60 mL/min (ref >60)
GFR calc non Af Amer: 46 mL/min — ABNORMAL LOW (ref >60)
Glucose, Bld: 102 mg/dL — ABNORMAL HIGH (ref 70–99)
Glucose, Bld: 122 mg/dL — ABNORMAL HIGH (ref 70–99)
Potassium: 3.6 mEq/L (ref 3.5–5.1)
Potassium: 3.7 mEq/L (ref 3.5–5.1)
Sodium: 136 mEq/L (ref 135–145)
Sodium: 137 mEq/L (ref 135–145)

## 2011-01-15 LAB — CBC
HCT: 37.3 % — ABNORMAL LOW (ref 39.0–52.0)
Hemoglobin: 12.5 g/dL — ABNORMAL LOW (ref 13.0–17.0)
MCHC: 33.2 g/dL (ref 30.0–36.0)
MCV: 96.4 fL (ref 78.0–100.0)
MCV: 96.7 fL (ref 78.0–100.0)
RBC: 3.87 MIL/uL — ABNORMAL LOW (ref 4.22–5.81)
RBC: 3.92 MIL/uL — ABNORMAL LOW (ref 4.22–5.81)
RDW: 14.3 % (ref 11.5–15.5)
WBC: 6.2 10*3/uL (ref 4.0–10.5)

## 2011-01-15 LAB — BRAIN NATRIURETIC PEPTIDE: Pro B Natriuretic peptide (BNP): 293 pg/mL — ABNORMAL HIGH (ref 0.0–100.0)

## 2011-01-15 LAB — PROTIME-INR
INR: 1.21 (ref 0.00–1.49)
Prothrombin Time: 15.5 seconds — ABNORMAL HIGH (ref 11.6–15.2)

## 2011-01-15 LAB — MRSA PCR SCREENING: MRSA by PCR: NEGATIVE

## 2011-01-15 LAB — TSH: TSH: 2.956 u[IU]/mL (ref 0.350–4.500)

## 2011-01-15 LAB — HEPATIC FUNCTION PANEL: Indirect Bilirubin: 0.4 mg/dL (ref 0.3–0.9)

## 2011-01-16 ENCOUNTER — Ambulatory Visit (INDEPENDENT_AMBULATORY_CARE_PROVIDER_SITE_OTHER): Payer: BC Managed Care – PPO | Admitting: Cardiology

## 2011-01-16 DIAGNOSIS — I2589 Other forms of chronic ischemic heart disease: Secondary | ICD-10-CM

## 2011-01-16 DIAGNOSIS — I472 Ventricular tachycardia, unspecified: Secondary | ICD-10-CM

## 2011-01-16 DIAGNOSIS — I2510 Atherosclerotic heart disease of native coronary artery without angina pectoris: Secondary | ICD-10-CM

## 2011-01-16 DIAGNOSIS — I10 Essential (primary) hypertension: Secondary | ICD-10-CM

## 2011-01-17 NOTE — Procedures (Unsigned)
CAROTID DUPLEX EXAM  INDICATION:  Follow up left carotid endarterectomy.  HISTORY: Diabetes:  No. Cardiac:  MI in 1991. Hypertension:  Yes. Smoking:  Previous, quit in 2003. Previous Surgery:  Left carotid endarterectomy with Dacron patch angioplasty, 05/30/2007. CV History:  Currently asymptomatic. Amaurosis Fugax No, Paresthesias No, Hemiparesis No. Other:  Hyperlipidemia.                                      RIGHT             LEFT Brachial systolic pressure:         140               138 Brachial Doppler waveforms:         Normal            Normal Vertebral direction of flow:        Antegrade         Antegrade DUPLEX VELOCITIES (cm/sec) CCA peak systolic                   63                66 ECA peak systolic                   101               68 ICA peak systolic                   102               75 ICA end diastolic                   35                25 PLAQUE MORPHOLOGY:                  Mixed             Heterogenous PLAQUE AMOUNT:                      Minimal           Minimal PLAQUE LOCATION:                    Bifurcation, ICA  CCA  IMPRESSION: 1. Right internal carotid artery velocities suggest 1% to 39%     stenosis. 2. Patent left carotid endarterectomy site with no evidence of     restenosis of the internal carotid artery. 3. Antegrade vertebral arteries bilaterally.  ___________________________________________ [name], M.D.  EM/MEDQ  D:  01/09/2011  T:  01/09/2011  Job:  [id number]

## 2011-01-17 NOTE — Procedures (Unsigned)
BYPASS GRAFT EVALUATION  INDICATION:  Followup bilateral lower extremity bypass grafts.  HISTORY: Diabetes:  No. Cardiac:  MI 66. Hypertension:  Yes. Smoking:  Quit 2003. Previous Surgery:  Right femoral to popliteal graft 01/13/2010, left femoral to popliteal graft 05/2002, left carotid endarterectomy 2008.  SINGLE LEVEL ARTERIAL EXAM                              RIGHT              LEFT Brachial:                    140                138 Anterior tibial:             133                146 Posterior tibial:            152                144 Peroneal: Ankle/brachial index:        1.09               1.04  PREVIOUS ABI:  Date:  10/12/2010  RIGHT:  0.92  LEFT:  1.05  LOWER EXTREMITY BYPASS GRAFT DUPLEX EXAM:  DUPLEX:  Patent right femoral to popliteal bypass graft with elevated velocities and plaque observed at the distal above knee anastomosis with a velocity of 373 cm/s. Patent left femoral to popliteal bypass graft.  IMPRESSION: 1. Widely patent right femoral to popliteal bypass graft with elevated     velocities at the distal anastomosis. 2. Widely patent left femoral to popliteal bypass graft. 3. Ankle brachial indices are within normal limits.  ___________________________________________ [name], M.D.  EM/MEDQ  D:  01/09/2011  T:  01/09/2011  Job:  [id number]

## 2011-01-22 ENCOUNTER — Encounter: Payer: Self-pay | Admitting: Internal Medicine

## 2011-01-22 ENCOUNTER — Encounter (INDEPENDENT_AMBULATORY_CARE_PROVIDER_SITE_OTHER): Payer: Medicare Other | Admitting: Internal Medicine

## 2011-01-22 DIAGNOSIS — Z95810 Presence of automatic (implantable) cardiac defibrillator: Secondary | ICD-10-CM

## 2011-01-22 DIAGNOSIS — I472 Ventricular tachycardia: Secondary | ICD-10-CM

## 2011-01-22 DIAGNOSIS — R609 Edema, unspecified: Secondary | ICD-10-CM

## 2011-01-22 DIAGNOSIS — I2589 Other forms of chronic ischemic heart disease: Secondary | ICD-10-CM

## 2011-01-28 LAB — CBC
HCT: 24.3 % — ABNORMAL LOW (ref 39.0–52.0)
HCT: 27 % — ABNORMAL LOW (ref 39.0–52.0)
HCT: 30.2 % — ABNORMAL LOW (ref 39.0–52.0)
Hemoglobin: 13.6 g/dL (ref 13.0–17.0)
Hemoglobin: 8.2 g/dL — ABNORMAL LOW (ref 13.0–17.0)
MCHC: 33.8 g/dL (ref 30.0–36.0)
MCHC: 34.2 g/dL (ref 30.0–36.0)
MCHC: 34.2 g/dL (ref 30.0–36.0)
MCHC: 34.3 g/dL (ref 30.0–36.0)
MCV: 99.5 fL (ref 78.0–100.0)
MCV: 99.6 fL (ref 78.0–100.0)
MCV: 99.8 fL (ref 78.0–100.0)
Platelets: 109 10*3/uL — ABNORMAL LOW (ref 150–400)
Platelets: 116 10*3/uL — ABNORMAL LOW (ref 150–400)
Platelets: 116 10*3/uL — ABNORMAL LOW (ref 150–400)
Platelets: 128 10*3/uL — ABNORMAL LOW (ref 150–400)
RBC: 2.43 MIL/uL — ABNORMAL LOW (ref 4.22–5.81)
RDW: 14 % (ref 11.5–15.5)
RDW: 14.3 % (ref 11.5–15.5)
RDW: 14.4 % (ref 11.5–15.5)
WBC: 6.5 10*3/uL (ref 4.0–10.5)
WBC: 7.6 10*3/uL (ref 4.0–10.5)

## 2011-01-28 LAB — GLUCOSE, CAPILLARY
Glucose-Capillary: 115 mg/dL — ABNORMAL HIGH (ref 70–99)
Glucose-Capillary: 115 mg/dL — ABNORMAL HIGH (ref 70–99)
Glucose-Capillary: 117 mg/dL — ABNORMAL HIGH (ref 70–99)
Glucose-Capillary: 122 mg/dL — ABNORMAL HIGH (ref 70–99)
Glucose-Capillary: 124 mg/dL — ABNORMAL HIGH (ref 70–99)
Glucose-Capillary: 128 mg/dL — ABNORMAL HIGH (ref 70–99)
Glucose-Capillary: 141 mg/dL — ABNORMAL HIGH (ref 70–99)
Glucose-Capillary: 147 mg/dL — ABNORMAL HIGH (ref 70–99)
Glucose-Capillary: 156 mg/dL — ABNORMAL HIGH (ref 70–99)
Glucose-Capillary: 161 mg/dL — ABNORMAL HIGH (ref 70–99)
Glucose-Capillary: 220 mg/dL — ABNORMAL HIGH (ref 70–99)

## 2011-01-28 LAB — APTT: aPTT: 31 seconds (ref 24–37)

## 2011-01-28 LAB — CROSSMATCH: ABO/RH(D): O POS

## 2011-01-28 LAB — BASIC METABOLIC PANEL
BUN: 13 mg/dL (ref 6–23)
BUN: 18 mg/dL (ref 6–23)
CO2: 26 mEq/L (ref 19–32)
CO2: 28 mEq/L (ref 19–32)
CO2: 28 mEq/L (ref 19–32)
Calcium: 7.8 mg/dL — ABNORMAL LOW (ref 8.4–10.5)
Calcium: 8.1 mg/dL — ABNORMAL LOW (ref 8.4–10.5)
Chloride: 102 mEq/L (ref 96–112)
Chloride: 103 mEq/L (ref 96–112)
Chloride: 105 mEq/L (ref 96–112)
Creatinine, Ser: 1.41 mg/dL (ref 0.4–1.5)
Creatinine, Ser: 1.53 mg/dL — ABNORMAL HIGH (ref 0.4–1.5)
Creatinine, Ser: 1.54 mg/dL — ABNORMAL HIGH (ref 0.4–1.5)
GFR calc Af Amer: >60 mL/min (ref >60)
GFR calc non Af Amer: 50 mL/min — ABNORMAL LOW (ref >60)
Glucose, Bld: 124 mg/dL — ABNORMAL HIGH (ref 70–99)
Glucose, Bld: 131 mg/dL — ABNORMAL HIGH (ref 70–99)
Potassium: 3.9 mEq/L (ref 3.5–5.1)
Potassium: 4 mEq/L (ref 3.5–5.1)
Potassium: 4.1 mEq/L (ref 3.5–5.1)
Sodium: 135 mEq/L (ref 135–145)
Sodium: 137 mEq/L (ref 135–145)

## 2011-01-28 LAB — COMPREHENSIVE METABOLIC PANEL
ALT: 22 U/L (ref 0–53)
Alkaline Phosphatase: 77 U/L (ref 39–117)
Calcium: 8.6 mg/dL (ref 8.4–10.5)
Chloride: 103 mEq/L (ref 96–112)
GFR calc Af Amer: 54 mL/min — ABNORMAL LOW (ref >60)
GFR calc non Af Amer: 45 mL/min — ABNORMAL LOW (ref >60)
Potassium: 3.9 mEq/L (ref 3.5–5.1)
Sodium: 137 mEq/L (ref 135–145)
Total Bilirubin: 0.5 mg/dL (ref 0.3–1.2)

## 2011-01-28 LAB — URINALYSIS, ROUTINE W REFLEX MICROSCOPIC
Bilirubin Urine: NEGATIVE
Glucose, UA: 250 mg/dL — AB
Hgb urine dipstick: NEGATIVE
Ketones, ur: NEGATIVE mg/dL
Nitrite: NEGATIVE
Protein, ur: NEGATIVE mg/dL
Specific Gravity, Urine: >1.046 — ABNORMAL HIGH (ref 1.005–1.030)
Urobilinogen, UA: 0.2 mg/dL (ref 0.0–1.0)
pH: 6 (ref 5.0–8.0)

## 2011-01-28 LAB — MRSA PCR SCREENING: MRSA by PCR: NEGATIVE

## 2011-01-28 LAB — POCT I-STAT, CHEM 8
BUN: 24 mg/dL — ABNORMAL HIGH (ref 6–23)
Calcium, Ion: 1.15 mmol/L (ref 1.12–1.32)
Glucose, Bld: 110 mg/dL — ABNORMAL HIGH (ref 70–99)
TCO2: 27 mmol/L (ref 0–100)

## 2011-01-28 LAB — HEMOGLOBIN A1C: Hgb A1c MFr Bld: 6.1 % (ref 4.6–6.1)

## 2011-01-28 LAB — PROTIME-INR: Prothrombin Time: 14.9 seconds (ref 11.6–15.2)

## 2011-02-01 NOTE — Assessment & Plan Note (Signed)
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 He is status post ICD implantation and received a number of discharges in the fall of 2010   He underwent catheterization in November 2010 demonstrated 1. Single-vessel occlusive atherosclerotic coronary artery disease       involving the right coronary artery.   2. Severe left ventricular dysfunction.  augmented beta blocker therapy was complicated by bradycardia. We ended up using sotalol and he has done quite well. He has no limitations in terms of chest pain or shortness of breath.    in early December he had recurrent fibula or discharges and was admitted to hospital. He underwent catheterization and subsequent LAD stenting. His blockers and sotalol were discontinued and amiodarone was initiated.  he was seen last in January; since then he has continued to be ventricular tachycardia free (hooray)   he is a relatively well since then. Is tolerating the amiodarone with out GI discomfort or neurological side effects he is looking forward to playing golf and resyuming worki   Current Medications (verified): 1)  Plavix 75 Mg Tabs (Clopidogrel Bisulfate) .... Take One Tablet By Mouth Daily 2)  Lipitor 80 Mg Tabs (Atorvastatin Calcium) .... Take One Tablet By Mouth Daily. 3)  Amlodipine Besylate 5 Mg Tabs (Amlodipine Besylate) .... Take One Tablet By Mouth Daily 4)  Furosemide 20 Mg Tabs (Furosemide) .... Take One Tablet By Mouth Daily. 5)  Aspirin Ec 325 Mg Tbec (Aspirin) .... Take One Tablet By Mouth Daily 6)  Niaspan 1000 Mg Cr-Tabs (Niacin (Antihyperlipidemic)) .... Take 1 and 1/2 Tablet Daily 7)  Multivitamins   Tabs  (Multiple Vitamin) .... Take 1 Tablet By Mouth Once A Day 8)  Nitrostat 0.4 Mg Subl (Nitroglycerin) .[name].. 1 Tablet Under Tongue At Onset of Chest Pain; You May Repeat Every 5 Minutes For Up To 3 Doses. 9)  Ramipril 5 Mg Caps (Ramipril) .... Take One Capsule By Mouth Daily 10)  Synthroid 75 Mcg Tabs (Levothyroxine Sodium) .[name].. 1 Once Daily 11)  Calcium-Magnesium-Zinc 500-250-12.5 Mg Tabs (Calcium-Magnesium-Zinc) .... 2 Tablet Once Daily 12)  Amiodarone Hcl 400 Mg Tabs (Amiodarone Hcl) .... Take One Tablet Once Daily  Allergies (verified): 1)  ! Demerol  Past History:  Past Medical History: Last updated: 06/26/2010 coronary artery diseasewith RCA disease and circumflex disease Bradycardia Ventricular tachycardia previously treated with sotalol Peripheral vascular disease history of claudication Hypertension Hyperlipidemia Febrile cytopenia-mild Treated hypothyroidism  Renal insufficiency Severe claudication, right leg secondary to superficial femoral  artery occlusion status post right common femoral to popliteal   above-knee bypass-2011  Past Surgical History: Last updated: 06/26/2010 lower extremity revascularizationwith aortobifem and renal bypass Prior ICD Right common femoral to popliteal-2011  Family History: Last updated: 09/07/2009 Family History of Coronary Artery Disease:   Social History: Last updated: 09/07/2009 Retired  Tobacco Use - No.  Regular Exercise - yes  Vital Signs:  Patient profile:   70 year old male Height:      72 inches Weight:      214 pounds BMI:     29.13 Pulse rate:   76 / minute Pulse rhythm:   regular BP sitting:   102 / 66  (left arm)  Cuff size:   regular  Vitals Entered By: [name] CMA (January 22, 2011 2:49 PM)  Physical Exam  General:  The patient was alert and oriented in no acute distress. HEENT Normal.  Neck veins were flat, carotids were brisk.  Lungs were clear.  Heart sounds were regular without murmurs or  gallops.  Abdomen was soft with active bowel sounds. There is no clubbing cyanosis or edema. Skin Warm and dry     ICD Specifications Following MD:  Sherryl Manges, MD     Referring MD:  [place] ICD Vendor:  Medtronic     ICD Model Number:  7232     ICD Serial Number:  [id number] ICD DOI:  02/17/2004     ICD Implanting MD:  Sherryl Manges, MD  Lead 1:    Location: RV     DOI: 05/18/2002     Model #: [id number]     Serial #: [id number]     Status: active  Indications::  ICM  Explantation Comments: [facility]  ICD Follow Up ICD Dependent:  No      Brady Parameters Mode VVI     Lower Rate Limit:  50      Tachy Zones VF:  188     VT:  214 FVT VIA VF     VT1:  150     Impression & Recommendations:  Problem # 1:  VENTRICULAR TACHYCARDIA (ICD-427.1) NO recurrent ventrcilar VT  continue amio  will decrease dose from 400  7 dy/wk to 5 days/week  which is equivalent to 300 mg daily.  Amiodarone surveillance laboratories will be drawn again with his PCP in 3 weeks. TSH was adjusted last month by Dr. [place] His updated medication list for this problem includes:    Plavix 75 Mg Tabs (Clopidogrel bisulfate) .[name]... Take one tablet by mouth daily    Amlodipine Besylate 5 Mg Tabs (Amlodipine besylate) .[name]... Take one tablet by mouth daily    Aspirin Ec 325 Mg Tbec (Aspirin) .[name]... Take one tablet by mouth daily    Nitrostat 0.4 Mg Subl (Nitroglycerin) .[name]... 1 tablet under tongue at onset of chest pain; you may repeat every 5 minutes for up to 3 doses.    Ramipril 5 Mg Caps (Ramipril) .[name]... Take one capsule by mouth daily    Amiodarone Hcl 400 Mg Tabs (Amiodarone hcl) .[name]... Take one tablet once daily  Problem # 2:  CARDIOMYOPATHY, ISCHEMIC EF 35"% 8/09 (ICD-414.8) Stable on currentbmedication His updated medication list for this problem includes:    Plavix 75 Mg Tabs (Clopidogrel bisulfate) .[name]... Take one tablet by mouth daily    Amlodipine Besylate 5 Mg Tabs (Amlodipine besylate) .[name]... Take one tablet by  mouth daily    Furosemide 20 Mg Tabs (Furosemide) .[name]... Take one tablet by mouth daily.    Aspirin Ec 325 Mg Tbec (Aspirin) .[name]... Take one tablet by mouth daily    Nitrostat 0.4 Mg Subl (Nitroglycerin) .[name]... 1 tablet under tongue at onset of chest pain; you may repeat every 5 minutes for up to 3 doses.    Ramipril 5 Mg Caps (Ramipril) .[name]... Take one capsule by mouth daily    Amiodarone Hcl 400 Mg Tabs (Amiodarone hcl) .[name]... Take one tablet once daily  Problem # 3:  EDEMA (ICD-782.3) This may be related to his Norvasc. It is worse on his operated leg. We will plan to DC his amlodipine at this time  Problem # 4:  IMPLANTABLE  DEFIBRILLATOR  DDD MDT (ICD-V45.02) Device  parameters and data were reviewed and no changes were made  Patient Instructions: 1)  Your physician recommends that you schedule a follow-up appointment in: 3 MONTHS WITH DR Graciela Husbands 2)  Your physician recommends that you return for lab work in: ON 02/21/11 PMD TSH LIVER  3)  Your physician has recommended you make the following change in your medication: STOP AMLODIPINE 4)  DECREASE AMIODARONE 400 MG TO 5 X A WEEK

## 2011-02-01 NOTE — Cardiovascular Report (Signed)
Summary: Office Visit   Office Visit   Imported By: Roderic Ovens 01/26/2011 14:48:57  _____________________________________________________________________  External Attachment:    Type:   Image     Comment:   External Document

## 2011-02-11 ENCOUNTER — Inpatient Hospital Stay (HOSPITAL_COMMUNITY)
Admission: EM | Admit: 2011-02-11 | Discharge: 2011-02-12 | DRG: 281 | Disposition: A | Discharge status: Home / Self Care | Payer: Medicare Other | Attending: Cardiology | Admitting: Cardiology

## 2011-02-11 ENCOUNTER — Emergency Department (HOSPITAL_COMMUNITY): Payer: Medicare Other

## 2011-02-11 ENCOUNTER — Inpatient Hospital Stay (HOSPITAL_COMMUNITY): Payer: Medicare Other

## 2011-02-11 DIAGNOSIS — I2589 Other forms of chronic ischemic heart disease: Secondary | ICD-10-CM | POA: Diagnosis present

## 2011-02-11 DIAGNOSIS — N189 Chronic kidney disease, unspecified: Secondary | ICD-10-CM | POA: Diagnosis present

## 2011-02-11 DIAGNOSIS — I252 Old myocardial infarction: Secondary | ICD-10-CM

## 2011-02-11 DIAGNOSIS — I472 Ventricular tachycardia, unspecified: Secondary | ICD-10-CM | POA: Diagnosis present

## 2011-02-11 DIAGNOSIS — I2510 Atherosclerotic heart disease of native coronary artery without angina pectoris: Secondary | ICD-10-CM | POA: Diagnosis present

## 2011-02-11 DIAGNOSIS — Z95810 Presence of automatic (implantable) cardiac defibrillator: Secondary | ICD-10-CM

## 2011-02-11 DIAGNOSIS — I739 Peripheral vascular disease, unspecified: Secondary | ICD-10-CM | POA: Diagnosis present

## 2011-02-11 DIAGNOSIS — I214 Non-ST elevation (NSTEMI) myocardial infarction: Principal | ICD-10-CM | POA: Diagnosis present

## 2011-02-11 DIAGNOSIS — I359 Nonrheumatic aortic valve disorder, unspecified: Secondary | ICD-10-CM | POA: Diagnosis present

## 2011-02-11 DIAGNOSIS — N179 Acute kidney failure, unspecified: Secondary | ICD-10-CM | POA: Diagnosis present

## 2011-02-11 DIAGNOSIS — Z8673 Personal history of transient ischemic attack (TIA), and cerebral infarction without residual deficits: Secondary | ICD-10-CM

## 2011-02-11 DIAGNOSIS — I059 Rheumatic mitral valve disease, unspecified: Secondary | ICD-10-CM | POA: Diagnosis present

## 2011-02-11 DIAGNOSIS — E785 Hyperlipidemia, unspecified: Secondary | ICD-10-CM | POA: Diagnosis present

## 2011-02-11 DIAGNOSIS — I129 Hypertensive chronic kidney disease with stage 1 through stage 4 chronic kidney disease, or unspecified chronic kidney disease: Secondary | ICD-10-CM | POA: Diagnosis present

## 2011-02-11 DIAGNOSIS — I4729 Other ventricular tachycardia: Secondary | ICD-10-CM | POA: Diagnosis present

## 2011-02-11 LAB — POCT I-STAT, CHEM 8
BUN: 28 mg/dL — ABNORMAL HIGH (ref 6–23)
Chloride: 109 mEq/L (ref 96–112)
Creatinine, Ser: 2.2 mg/dL — ABNORMAL HIGH (ref 0.4–1.5)
Potassium: 4.2 mEq/L (ref 3.5–5.1)
Sodium: 139 mEq/L (ref 135–145)

## 2011-02-11 LAB — POCT CARDIAC MARKERS
CKMB, poc: 3.4 ng/mL (ref 1.0–8.0)
Myoglobin, poc: 261 ng/mL (ref 12–200)

## 2011-02-11 LAB — BASIC METABOLIC PANEL
Calcium: 9 mg/dL (ref 8.4–10.5)
Creatinine, Ser: 1.99 mg/dL — ABNORMAL HIGH (ref 0.4–1.5)
GFR calc Af Amer: 40 mL/min — ABNORMAL LOW (ref >60)
GFR calc non Af Amer: 33 mL/min — ABNORMAL LOW (ref >60)
Glucose, Bld: 141 mg/dL — ABNORMAL HIGH (ref 70–99)
Sodium: 135 mEq/L (ref 135–145)

## 2011-02-11 LAB — CBC
Hemoglobin: 13.9 g/dL (ref 13.0–17.0)
MCHC: 33.2 g/dL (ref 30.0–36.0)
Platelets: 145 10*3/uL — ABNORMAL LOW (ref 150–400)
RBC: 4.31 MIL/uL (ref 4.22–5.81)

## 2011-02-11 LAB — PROTIME-INR
INR: 1.13 (ref 0.00–1.49)
Prothrombin Time: 14.7 seconds (ref 11.6–15.2)

## 2011-02-11 LAB — DIFFERENTIAL
Basophils Absolute: 0.1 10*3/uL (ref 0.0–0.1)
Basophils Relative: 1 % (ref 0–1)
Neutro Abs: 5.8 10*3/uL (ref 1.7–7.7)
Neutrophils Relative %: 66 % (ref 43–77)

## 2011-02-11 LAB — CK TOTAL AND CKMB (NOT AT ARMC): Relative Index: 4.1 — ABNORMAL HIGH (ref 0.0–2.5)

## 2011-02-11 LAB — GLUCOSE, CAPILLARY

## 2011-02-12 ENCOUNTER — Telehealth: Payer: Self-pay

## 2011-02-12 ENCOUNTER — Telehealth: Payer: Self-pay | Admitting: Internal Medicine

## 2011-02-12 LAB — COMPREHENSIVE METABOLIC PANEL
ALT: 36 U/L (ref 0–53)
AST: 35 U/L (ref 0–37)
Albumin: 3.2 g/dL — ABNORMAL LOW (ref 3.5–5.2)
Alkaline Phosphatase: 85 U/L (ref 39–117)
BUN: 18 mg/dL (ref 6–23)
Chloride: 110 mEq/L (ref 96–112)
GFR calc Af Amer: 53 mL/min — ABNORMAL LOW (ref >60)
Potassium: 4 mEq/L (ref 3.5–5.1)
Sodium: 138 mEq/L (ref 135–145)
Total Bilirubin: 0.7 mg/dL (ref 0.3–1.2)
Total Protein: 5.6 g/dL — ABNORMAL LOW (ref 6.0–8.3)

## 2011-02-12 LAB — MAGNESIUM: Magnesium: 2 mg/dL (ref 1.5–2.5)

## 2011-02-12 NOTE — Telephone Encounter (Signed)
Pt is security guard at [employer], discussed w/Dr Graciela Husbands he states no work and no driving for 1 week, pt is aware

## 2011-02-13 NOTE — Discharge Summary (Addendum)
NAMEBRAYDIN, [name] NO.:  [id number]  MEDICAL RECORD NO.:  [id number]           PATIENT TYPE:  I  LOCATION:  2029                         FACILITY:  [facility]  PHYSICIAN:  Duke Salvia, MD, FACCDATE OF BIRTH:  [birth year]  DATE OF ADMISSION:  02/11/2011 DATE OF DISCHARGE:  02/12/2011                              DISCHARGE SUMMARY   DISCHARGE DIAGNOSES: 1. Recurrent ventricular tachycardia, requiring external cardioversion     on February 11, 2011.     a.     Amiodarone increased to 400 mg p.o. daily. 2. History of ventricular tachycardia, status post implantable     cardioverter-defibrillator implantation with last change out on     April 2005 with Medtronic Maximo device. 3. Ischemic cardiomyopathy with ejection fraction of 35-40% by     echocardiogram on December 2011. 4. Coronary artery disease.     a.     Status post myocardial infarction in 1982.     b.     Status post percutaneous coronary intervention and drug-      eluting stent placement to the left circumflex in December 2011. 5. 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 6. Chronic renal insufficiency with the creatinine reported 1.5 to     1.6.     a.     Acute renal insufficiency this admission with discharge      creatinine of 1.57. 7. Non-ST segment elevation myocardial infarction felt secondary to     recurrent ventricular tachycardia this admission. 8. Peripheral artery disease.     a.     Status post right common femoral and popliteal in March      2011.     b.     Status post left carotid endarterectomy in 2008.     c.     Status post left common femoral to bypass graft in July      2003.     d.     Resection and grafting of the inferior abdominal aortic      aneurysm with insertion of the aortobifemoral and common bypass      with a Dacron graft with proximal aortic endarterectomy in June      2003.     e.      Aorta to right renal artery bypass in June 2003 as well as      left renal artery bypass. 9. Hyperlipidemia. 10.Hypertension. 11.Previous transient ischemic attack.  SURGICAL HISTORY:  As noted above including lumbosacral spine fusion, excision of mass with multiple orthopedic procedures, first left knee, right elbow right knee and cholecystectomy.  HOSPITAL COURSE:  [name] is a 70 year old gentleman with longstanding history of ischemic heart disease with an EF of 35-40% as well as ventricular tachycardia, status post ICD implantation, along with coronary artery disease, peripheral artery disease, and chronic renal insufficiency.  He presented to [facility] with complaining of recurrent ventricular tachycardia.  He apparently required cardioversion in the ER externally.  He was initially treated with IV lidocaine and amiodarone prior to this with  no response.  There was no ICD discharge.  His device was interrogated.  His blood work showed an initially mildly elevated troponin at 0.09 and then elevated creatinine of 2.2, with excursion from his baseline.  The patient was loaded with IV amiodarone and did well overnight.  His creatinine came down to 1.52.  His device interrogation showed 15 nonsustained VT episodes.  Programing changes per Dr. Graciela Husbands were that the VT was changed to 120 beats per minute, with two extra ATP sequences.  He did rule in with a troponin of 1.21, but Dr. Graciela Husbands felt this was secondary to his recurrent VT.  Dr. Graciela Husbands has written a transition him to 40 mg p.o. amiodarone with daily, with a 24-hour Holter monitor as well as follow up in several weeks.  He has seen and examined the patient today and feels he is stable for discharge.  DISCHARGE LABS:  WBC 8.8, hemoglobin 14.6, hematocrit 43, platelet count 145.  Sodium 138, potassium 4, chloride 110, CO2 23, glucose 112, BUN 18, creatinine 1.57.  LFTs were normal at the exception of  decreased total protein 5.6 with albumin 3.2.  STUDIES:  Chest x-ray on February 11, 2011, showed no active disease.  DISCHARGE MEDICATIONS: 1. Amiodarone 400 mg daily.  Originally, he was taking it only 5 days     a week. 2. Altace 5 mg daily. 3. Aspirin 325 mg nightly. 4. Calcium/magnesium/zinc OTC three tablets nightly. 5. Loteprednol ophthalmic one drop both eyes daily. 6. Lasix 20 mg daily. 7. Lipitor 80 mg nightly. 8. Multivitamin one tablet daily at bedtime. 9. Niaspan 500 mg three tablets nightly. 10.Nitroglycerin sublingual 0.4 mg every 5 minutes as needed up to     three doses for chest pain. 11.Plavix 75 mg daily. 12.Synthroid 75 mcg daily.  DISPOSITION:  [name] will be discharged in stable condition to home.  He is instructed to increase activity slowly and follow a low- salt heart-healthy diet.  Dr. Odessa Fleming office will call him to arrange for his 24-hour Holter monitor.  He will see Dr. Graciela Husbands and follow up on April 09, 2011 at 9:15 a.m.  In the interim, if he has any recurrent problems, he is instructed to call or return.  DURATION OF DISCHARGE ENCOUNTER:  Greater than 30 minutes including physician and PA time.     Ronie Spies, P.A.C.   ______________________________ Duke Salvia, MD, [facility]    DD/MEDQ  D:  02/12/2011  T:  02/13/2011  Job:  [id number]  cc:   Peter M. [place], M.D. [facility]  Electronically Signed by Ronie Spies  on 02/13/2011 12:22:11 PM Electronically Signed by Sherryl Manges MD [facility] on 02/28/2011 08:48:41 AM

## 2011-02-13 NOTE — Telephone Encounter (Signed)
Patient call back for appt. For Monitor,will be done on Friday 13

## 2011-02-16 ENCOUNTER — Encounter (INDEPENDENT_AMBULATORY_CARE_PROVIDER_SITE_OTHER): Payer: Medicare Other

## 2011-02-16 DIAGNOSIS — I472 Ventricular tachycardia: Secondary | ICD-10-CM

## 2011-02-22 ENCOUNTER — Encounter: Payer: Self-pay | Admitting: *Deleted

## 2011-03-10 NOTE — H&P (Signed)
[name], [name] NO.:  [id number]  MEDICAL RECORD NO.:  [id number]           PATIENT TYPE:  I  LOCATION:  2029                         FACILITY:  [facility]  PHYSICIAN:  Gerrit Friends. Dietrich Pates, MD, FACCDATE OF BIRTH:  August 21, 1941  DATE OF ADMISSION:  02/11/2011 DATE OF DISCHARGE:                             HISTORY & PHYSICAL   REFERRING PHYSICIAN:  Nelva Nay, MD  PRIMARY CARDIOLOGIST:  Peter M. [place], MD  PRIMARY ELECTROPHYSIOLOGIST:  Duke Salvia, MD, [facility]  PRIMARY CARE:  [facility].  HISTORY OF PRESENT ILLNESS:  This is a 70 year old gentleman with longstanding ischemic heart disease who presents with sustained ventricular tachycardia requiring external cardioversion.  Mr. Brusca's cardiac history dates to 17 when he suffered an acute myocardial infarction.  A catheterization 3 months ago showed scattered nonobstructive disease plus a 60% third diagonal stenosis, a 70% mid circumflex lesion, and a subtotal occlusion of the mid RCA with left to right collaterals.  He was admitted in December with multiple AICD discharges due to ventricular tachycardia.  He had previously taken sotalol to control his arrhythmia, but this lost efficacy.  He has been on amiodarone for the past few months with apparent control until this event.  PAST MEDICAL HISTORY:  Otherwise notable for chronic renal insufficiency with baseline creatinine of approximately 1.5-1.6, peripheral artery disease with prior lower extremity and renal artery bypass graft surgery.  He has hyperlipidemia and hypertension and has suffered a previous TIA.  SURGICAL PROCEDURES:  Lumbosacral spine fusion, excision of a mass from his head, multiple orthopedic procedures for his left knee, right elbow, and right knees and cholecystectomy.  MOST RECENT MEDICATIONS:  As listed in his medical reconciliation.  ALLERGIES:  He has no true drug allergies, but has suffered an  adverse reaction to Demerol.  SOCIAL HISTORY:  Retired from employment at [employer] in 2004.  Cigarette smoking was discontinued 20 years ago.  No excessive use of alcohol.  Married, with 2 adult children.  FAMILY HISTORY:  Positive for myocardial infarction in his father and congestive heart failure in his mother, both at fairly advanced ages.  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  All other systems reviewed and are negative.  PHYSICAL EXAMINATION:  GENERAL:  A very pleasant gentleman with proportionate weight and height, in no acute distress. VITAL SIGNS:  Blood pressure is 100/55, heart rate 72 and regular, respirations 16, and temperature 98.1. HEENT:  EOMs full; pupils are equal, round, and react to light; normal lids and conjunctivae; normal oral mucosa. NECK:  No jugular venous distention; no carotid bruits; surgical scar over the left carotid where a vascular procedure was apparently performed. LUNGS:  Minimal bibasilar rales; slightly prolonged expiratory phase. ENDOCRINE:  No thyromegaly. HEMATOPOIETIC:  No adenopathy. CARDIAC:  Normal first and second heart sounds; modest systolic murmur; no gallop nor left ventricular lift appreciated. ABDOMEN:  Soft and nontender;  normal bowel sounds; no masses; no organomegaly. EXTREMITIES:  Distal pulses intact; no edema. NEUROLOGIC:  Symmetric strength and tone; normal cranial nerves. PSYCHIATRIC:  Alert and oriented; normal affect. SKIN:  No significant lesions.  EKG is pending.  Rhythm strip shows ventricular tachycardia with heart rate around 150, with subsequent shock and conversion immediately to sinus rhythm.  Chest x-ray:  No acute  abnormalities.  LABORATORY DATA:  Unremarkable except for a creatinine of 2.2.  IMPRESSION:  Ms. Gracey presents with recurrent ventricular tachycardia, now having failed both sotalol and moderate-dose amiodarone.  We could attempt to resume treatment at 400 mg per day, which appeared to be effective in the past.  Other alternatives would be addition of second drug or radiofrequency ablation.  We will plan to interrogate his device and to have him reassessed by the Electrophysiology Service in the morning.  He has an ischemic cardiomyopathy, but has no active congestive heart failure.  He did experience what sounds like ischemic chest pain during ventricular tachycardia.  Myocardial infarction will be ruled out.  He appears to have acute on chronic kidney disease, perhaps due to hypoperfusion related to his ventricular tachycardia.  Renal function will be followed.     Gerrit Friends. Dietrich Pates, MD, [facility]     RMR/MEDQ  D:  02/11/2011  T:  02/12/2011  Job:  [id number]  Electronically Signed by Alder Bing MD [facility] on 03/10/2011 10:19:40 PM

## 2011-03-20 NOTE — Procedures (Signed)
CAROTID DUPLEX EXAM   INDICATION:  Follow up left carotid endarterectomy.   HISTORY:  Diabetes:  No  Cardiac:  MI in 1992  Hypertension:  Yes  Smoking:  Previous  Previous Surgery:  Left carotid endarterectomy  CV History:  Asymptomatic                                       RIGHT             LEFT  Brachial systolic pressure:         119               120  Brachial Doppler waveforms:         triphasic         triphasic  Vertebral direction of flow:        antegrade         antegrade  DUPLEX VELOCITIES (cm/sec)  CCA peak systolic                   68                81  ECA peak systolic                   121               78  ICA peak systolic                   83                103  ICA end diastolic                   26                38  PLAQUE MORPHOLOGY:                  calcified         calcified  PLAQUE AMOUNT:                      mild              mild  PLAQUE LOCATION:                    bifurcation       CCA   IMPRESSION:     1. Mild plaque noted at the right bifurcation and the left common        carotid artery.     2. Left ICA is tortuous.   ___________________________________________  [name], M.D.   CJ/MEDQ  D:  11/29/2009  T:  11/29/2009  Job:  [id number]

## 2011-03-20 NOTE — Procedures (Signed)
BYPASS GRAFT EVALUATION   INDICATION:  Follow up left lower extremity bypass graft.   HISTORY:  Diabetes:  No.  Cardiac:  No.  Hypertension:  Yes.  Smoking:  Previous.  Previous Surgery:  Left fem-pop bypass graft in July, 2003, right fem-  pop bypass graft on 01/13/2010.   SINGLE LEVEL ARTERIAL EXAM                               RIGHT              LEFT  Brachial:                    124                130  Anterior tibial:             124                121  Posterior tibial:            105                133  Peroneal:  Ankle/brachial index:        0.95               1.02   PREVIOUS ABI:  Date: 02/14/2010  RIGHT:  0.99  LEFT:  1.05   LOWER EXTREMITY BYPASS GRAFT DUPLEX EXAM:   DUPLEX:  Biphasic Doppler waveforms noted throughout the right lower  extremity bypass graft in its native vessels with no significant  increase in velocities noted.   IMPRESSION:  1. Patent right femoropopliteal bypass graft with no evidence of      stenosis.  2. Stable bilateral ankle brachial indices.   ___________________________________________  [name], M.D.   CH/MEDQ  D:  06/28/2010  T:  06/28/2010  Job:  [id number]

## 2011-03-20 NOTE — Assessment & Plan Note (Signed)
OFFICE VISIT   [name], [name]  DOB:  03-Jul-1941                                       11/29/2009  [id number]#:[id number]   The patient is a patient well-known to me, a 70 year old male status  post multiple vascular procedures including aortobifemoral bypass graft,  bilateral renal artery bypass, left femoral popliteal bypass and left  carotid endarterectomy in July of 2008.  He has had stable right leg  claudication symptoms.  He returns today with some slight worsening of  his symptoms but no history of rest pain.  He does have calf discomfort  after walking about 500 feet but he is able to continue after resting a  short bit and he can walk up to a few miles total.  He has no history of  rest pain, nonhealing ulcers, infection, cellulitis or other problems.  He denies any claudication symptoms in the right leg.  He also denies  any neurologic symptoms such as hemiparesis, aphasia, amaurosis fugax,  diplopia, blurred vision or syncope.  He has been doing well from a  cardiac standpoint with no chest pain, dyspnea on exertion, PND or  orthopnea.   CHRONIC MEDICAL PROBLEMS:  1. Include coronary artery disease.  2. Hypertension.  3. Hyperlipidemia.  4. Renovascular hypertension now resolved.  5. History of pacemaker and defibrillator insertion in 2003.  6. TIA in 2001 which has not recurred.  7. He recently had a heart catheterization by Dr. [place] which      apparently revealed no change over the last several years.   FAMILY HISTORY:  Positive for coronary artery disease and stroke in his  father, coronary artery disease in his mother, negative for diabetes.   SOCIAL HISTORY:  He smoked a pack of cigarettes a day for 40+ years but  stopped in 2003.   REVIEW OF SYSTEMS:  Denies any chest pain, dyspnea on exertion, PND,  orthopnea.  No neurologic symptoms or abdominal (GI) symptoms at this  time.   PHYSICAL EXAM:  Vital signs:  Blood pressure 128/75,  heart rate 56,  respiration 14, temperature 97.4.  General:  He is a well-developed,  well-nourished male who is in no apparent distress.  HEENT:  Exam is  unremarkable.  EOMs intact.  Conjunctivae normal.  Lungs:  Clear to  auscultation.  Cardiovascular:  Regular rhythm.  No murmurs.  Abdomen:  Soft, nontender with no masses.  He has a well-healed midline incision.  Musculoskeletal:  Exam reveals no major deformities.  Neurological:  Normal.  Skin:  Free of rashes.  Lower extremity:  Exam reveals left has  3+ femoral, 2+ popliteal and 2+ posterior tibial pulse.  Right leg has a  3+ femoral, absent popliteal and distal pulses.   I ordered lower extremity arterial Dopplers and a duplex scan of his  right leg today.  I reviewed and interpreted these.  He has a widely  patent left femoral-popliteal graft.  Right leg has an area of total  occlusion of his right superficial femoral artery in the proximal third  of the leg.  The remainder of the vessel has some diffuse mild disease  but is not occluded.  ABI on the right is 0.39 down from 0.59 a few  years ago and left is 0.98.   We discussed this and he does not think the symptoms are limiting  him  enough to further evaluate at this time.  If his right leg symptoms  worsen he will be in touch with [place].  Otherwise we will continue to  follow him on a regular basis for his lower extremity graft.  I also  ordered a carotid duplex exam today because he has not had that checked  in a year and he has no evidence of any significant internal carotid  flow reduction at this time.     [name] [place], M.D.  Electronically Signed   JDL/MEDQ  D:  11/29/2009  T:  11/30/2009  Job:  [id number]

## 2011-03-20 NOTE — Procedures (Signed)
BYPASS GRAFT EVALUATION   INDICATION:  Right stable claudication, left lower extremity bypass  graft.   HISTORY:  Diabetes:  No.  Cardiac:  MI.  Hypertension:  Yes.  Smoking:  Previous.  Previous Surgery:  Left fem-pop bypass graft 05/06/2002.   SINGLE LEVEL ARTERIAL EXAM                               RIGHT              LEFT  Brachial:                    102                105  Anterior tibial:             62                 107  Posterior tibial:            61                 113  Peroneal:  Ankle/brachial index:        0.59               1.08   PREVIOUS ABI:  Date: 11/23/2008  RIGHT:  0.63  LEFT:  0.98   LOWER EXTREMITY BYPASS GRAFT DUPLEX EXAM:   DUPLEX:  Doppler arterial waveforms appear biphasic proximal to, within  and distal to bypass graft.   IMPRESSION:  1. Stable ABIs bilaterally.  2. Patent left femoral-popliteal artery bypass graft.  3. No significant changes from previous study.   ___________________________________________  [name], M.D.   AS/MEDQ  D:  05/23/2009  T:  05/24/2009  Job:  [id number]

## 2011-03-20 NOTE — H&P (Signed)
HISTORY AND PHYSICAL EXAMINATION   January 10, 2010   Re:  [name], [name]              DOB:  31-Dec-1940   DATE OF ADMISSION:  March 11.   CHIEF COMPLAINT:  Right leg claudication secondary to right superficial  femoral occlusion.   HISTORY OF PRESENT ILLNESS:  A 70 year old male patient has multiple  vascular procedures in the past, including aortobifemoral bypass graft,  bilateral renal artery bypass graft, left fem-pop bypass graft with  saphenous vein, left carotid endarterectomy.  He now has severe right  calf claudication, limiting him to walking about 100 yards, at which  point he must reset 5 minutes before proceeding.  He has had no rest  pain or history of nonhealing ulcers, now ready to proceed with right  femoral-popliteal bypass grafting.  Scheduled for angiogram on March 9.   CHRONIC MEDICAL PROBLEMS:  1. Coronary artery disease, cardiac catheterization 3 months ago with      no severe disease.  2. Hypertension.  3. Hyperlipidemia.  4. Renovascular hypertension, now resolved.  5. History of pacemaker and defibrillator.  Insertion in 2003 by Dr.      Graciela Husbands.  6. TIA in 2001, which has not recurred.   FAMILY HISTORY:  Positive for coronary artery disease and a stroke in  his father.  Coronary artery disease in his mother.  Negative for  diabetes.   SOCIAL HISTORY:  Smoked a pack of cigarettes a day for 40 plus years.  Stopped in 2003.   REVIEW OF SYSTEMS:  Denies chest pain, dyspnea on exertion, PND,  orthopnea.  No neurologic symptoms.  Denies any GI or GU symptoms.  States his renal function has been normal.  All other systems are  negative in review of systems.   PHYSICAL EXAMINATION:  Blood pressure is 115/72, heart rate 56,  respirations 14, temperature 98.  Generally, he is a well-developed,  well-nourished male in no apparent distress, alert and oriented x3.  HEENT: Unremarkable.  EOMs intact.  Conjunctivae normal.  Chest: Clear  to  auscultation.  Cardiovascular: Regular rhythm.  No murmurs.  Carotid  pulses 3+.  No audible bruits.  Abdomen: Soft, nontender, with no  masses.  He has a well-healed midline incision.  Musculoskeletal exam  reveals no major deformities.  Neurologic:  Normal.  Skin is free of  rashes.  Lower extremity exam reveals 3+ right femoral pulse.  No  popliteal or distal pulse.  Left leg has a 3+ femoral, 2+ popliteal, and  2+ posterior tibial pulse.   ABI's most recently were 0.39 on the right and 1.0 on the left with no  flow reduction on carotid duplex exam in January 2011.   IMPRESSION:  Right superficial femoral occlusion.   PLAN:  Admit the patient on March 11 for an elective right carotid  endarterectomy with saphenous vein if adequate.  Risks and benefits have  been thoroughly discussed.  The patient would like to proceed.     [name], M.D.  Electronically Signed   JDL/MEDQ  D:  01/10/2010  T:  01/10/2010  Job:  [id number]

## 2011-03-20 NOTE — Assessment & Plan Note (Signed)
OFFICE VISIT   KRISS, ISHLER  DOB:  01-09-1941                                       02/14/2010  EAVWU#:[id number]   The patient returns for followup regarding his right femoral-popliteal  saphenous vein graft which I performed March 11 for severe claudication  of the right leg.  The saphenous vein was marginal in size but was used  at the above knee popliteal level.  His ABIs improved from 0.39 to 0.99  of the posterior tibial artery and his claudication symptoms have been  relieved.  He has had two areas in the vein harvesting incision, one  below the knee and one in the distal thigh that had some mild drainage.  Cultures were obtained from his family doctor but he has been on no  antibiotics.  They state that this has improved significantly over the  last few days and he has had no chills and fever.   PHYSICAL EXAMINATION:  On exam today blood pressure is 112/69, heart  rate 54, temperature 97.8.  He has a suture abscess in the distal thigh  wound and vein harvesting wound in the mid calf.  Vicryl suture was  excised from both of these areas.  There was no active purulent drainage  and this should heal within the next week or so.  There is no  surrounding cellulitis.  He does have a well-perfused foot with 1+ edema  on the right side with improved ABIs noted.  He was encouraged to  elevate the leg as much as possible and continue to increase his  ambulation and he will continue to be followed by the fem-pop protocol  with next visit in June.  Otherwise he will see me on a p.r.n. basis.     [name], M.D.  Electronically Signed   JDL/MEDQ  D:  02/14/2010  T:  02/15/2010  Job:  [id number]

## 2011-03-20 NOTE — Assessment & Plan Note (Signed)
OFFICE VISIT   [name], [name]  DOB:  Mar 18, 1941                                       06/17/2007  [id number]#:[id number]   The patient is status post left carotid endarterectomy on July 25 for  severe but asymptomatic left internal carotid stenosis plus resection of  a redundant left internal carotid artery with primary reanastomosis.  He  has done well postoperatively with no neurologic complications or  specific complaints.  He is swallowing well, and denies any hemispheric  or non-hemispheric TIAs.  He has a mild left marginal mandibular nerve  paresis which should resolve with time.  Carotid pulses are 3+ with no  bruits audible.  Neurologic exam is otherwise normal.  He was reassured  regarding these findings.  Will continue to take 1 aspirin per day and  notify us if he has any neurologic symptoms, otherwise return in 6  months for followup carotid duplex exam.   Quita Skye. Hart [place], M.D.  Electronically Signed   JDL/MEDQ  D:  06/17/2007  T:  06/19/2007  Job:  [id number]

## 2011-03-20 NOTE — Procedures (Signed)
BYPASS GRAFT EVALUATION   INDICATION:  Followup left fem-pop bypass graft.   HISTORY:  Diabetes:  No.  Cardiac:  History of MI.  Hypertension:  Yes.  Smoking:  Previous.  Previous Surgery:  Left fem-pop bypass graft on 05/06/2002.   SINGLE LEVEL ARTERIAL EXAM                               RIGHT              LEFT  Brachial:                    123                113  Anterior tibial:             71                 130  Posterior tibial:            90                 144  Peroneal:  Ankle/brachial index:        0.73               1.17   PREVIOUS ABI:  Date: 11/24/2007  RIGHT:  0.66  LEFT:  0.98   LOWER EXTREMITY BYPASS GRAFT DUPLEX EXAM:   DUPLEX:  Biphasic waveforms noted throughout the left lower extremity  bypass graft in its native vessels with no increase in velocities noted.   IMPRESSION:  1. Patent left fem-pop bypass graft with no evidence of stenosis      noted.  2. Stable bilateral ankle-brachial indices noted.   ___________________________________________  [name], M.D.   CH/MEDQ  D:  05/24/2008  T:  05/24/2008  Job:  [id number]

## 2011-03-20 NOTE — Procedures (Signed)
CAROTID DUPLEX EXAM   INDICATION:  Follow-up carotid artery disease.   HISTORY:  Diabetes:  No  Cardiac:  MI in 1992  Hypertension:  Yes  Smoking:  Quit  Previous Surgery:  Left carotid endarterectomy  CV History:  No  Amaurosis Fugax No, Paresthesias No, Hemiparesis No                                       RIGHT             LEFT  Brachial systolic pressure:         126               129  Brachial Doppler waveforms:         Within normal limits                Within normal limits  Vertebral direction of flow:        Antegrade decreased velocity        Antegrade decreased  velocity  DUPLEX VELOCITIES (cm/sec)  CCA peak systolic                   64                63  ECA peak systolic                   94                125  ICA peak systolic                   71                89  ICA end diastolic                   22                29  PLAQUE MORPHOLOGY:                  Heterogeneous     None  PLAQUE AMOUNT:                      Mild              None  PLAQUE LOCATION:                    ICA               None   IMPRESSION:  1. A 20 to 39% stenosis of the right internal carotid artery.  2. Patent left internal carotid artery with no evidence of restenosis.      ___________________________________________  [name], M.D.   AC/MEDQ  D:  11/23/2008  T:  11/23/2008  Job:  [id number]

## 2011-03-20 NOTE — Procedures (Signed)
BYPASS GRAFT EVALUATION   INDICATION:   HISTORY:  Diabetes:  No.  Cardiac:  MI in 1992.  Hypertension:  Yes.  Smoking:  Quit.  Previous Surgery:  Left femoropopliteal bypass graft on 05/06/2002.   INDICATIONS:  Flow of lower extremity bypass graft.   SINGLE LEVEL ARTERIAL EXAM                               RIGHT              LEFT  Brachial:                    126.               129.  Anterior tibial:             81.                119.  Posterior tibial:            80.                126.  Peroneal:  Ankle/brachial index:        0.63.              0.98.   PREVIOUS ABI:  Date: 05/24/2008  RIGHT:  0.73  LEFT:  1.17   LOWER EXTREMITY BYPASS GRAFT DUPLEX EXAM:   DUPLEX:  Biphasic duplex waveform noted from the inflow artery within  the graft and outflow artery with no evidence of stenosis.   IMPRESSION:  1. Right ankle-brachial index suggests moderate arterial disease with      monophasic Doppler waveforms.  2. Normal left ankle-brachial index with triphasic Doppler waveform.   ___________________________________________  [name], M.D.   AC/MEDQ  D:  11/23/2008  T:  11/23/2008  Job:  [id number]

## 2011-03-20 NOTE — Op Note (Signed)
[name], [name] NO.:  [id number]   MEDICAL RECORD NO.:  [id number]          PATIENT TYPE:  INP   LOCATION:  3304                         FACILITY:  [facility]   PHYSICIAN:  Quita Skye. Hart [place], M.D.  DATE OF BIRTH:  [birth year]   DATE OF PROCEDURE:  05/30/2007  DATE OF DISCHARGE:                               OPERATIVE REPORT   PREOPERATIVE DIAGNOSIS:  Severe left internal carotid stenosis -  asymptomatic.   POSTOPERATIVE DIAGNOSIS:  Severe left internal carotid stenosis -  asymptomatic with kink in left internal carotid artery.   PROCEDURE:  1. Left carotid endarterectomy with Dacron patch angioplasty.  2. Resection of redundant left internal carotid artery with primary      reanastomosis.   SURGEON:  Quita Skye. Hart [place], M.D.   FIRST ASSISTANT:  Gae Bon, PA Student   ANESTHESIA:  General endotracheal.   BRIEF HISTORY:  This is a patient with a history of diffuse vascular  disease and multiple revascularization procedures as well as coronary  artery disease was evaluated in follow-up of his lower extremity and  found to have a left carotid bruit.  Duplex scan revealed a 90% left  internal carotid stenosis with no flow reduction on the right side and  he is scheduled for left carotid endarterectomy.   PROCEDURE IN DETAIL:  The patient was taken to the operating room,  placed in the supine position, at which time satisfactory general  endotracheal anesthesia was administered.  The left neck was prepped  with Betadine scrub solution and draped in a routine sterile manner.  An  incision was made along the anterior border of the sternocleidomastoid  muscle, carried down through subcutaneous tissue and platysma using the  Bovie.  The common facial vein and external jugular vein were ligated  with 3-0 silk ties and divided exposing the common, internal, and  external carotid arteries.  Care was taken not to injure the vagus or  hypoglossal nerves, both which  were exposed.  There was a calcified  atherosclerotic plaque at the distal common carotid artery extending up  the internal carotid artery about 4 cm.  After mobilizing the internal  carotid distally which had no palpable pulse, there was quite a bit of  redundancy in the vessel which clearly would require resection to avoid  a kink.  A #10 shunt was prepared and the patient was heparinized.  The  carotid vessels were occluded with vascular clamps, a longitudinal  opening made in the common carotid with a 15 blade, extended up the  internal carotid with Potts scissors to a point distal to the disease.  A #10 shunt was inserted without difficulty re-establishing flow in  about two minutes.  A standard endarterectomy was then performed using  the elevator and the Potts scissors with eversion endarterectomy of the  external carotid.  The plaque feathered off the distal internal carotid  artery nicely not requiring any tacking sutures.  The lumen was  thoroughly irrigated with heparin saline.  All loose debris was  carefully removed and using 6-0 Prolene tacking sutures, a 1.5 cm  segment of  internal carotid artery was resected with a primary  reanastomosis with 6-0 Prolene to shorten the vessel.  A Dacron patch  was then sewn into place.  Following antegrade and retrograde flushing,  the shunt was removed, closure completed, re-establishing flow initially  up the external and then the internal branch.  There was excellent  Doppler flow in both vessels.  Adequate hemostasis was achieved  following  administration of protamine.  A Jackson-Pratt drain was brought out  through an inferiorly based stab wound and secured with a nylon suture.  The wound was closed with Vicryl in a subcuticular fashion.  A sterile  dressing was applied.  The patient was taken to the recovery room in  satisfactory condition.      [name], M.D.  Electronically Signed     JDL/MEDQ  D:  05/30/2007  T:   05/30/2007  Job:  [id number]

## 2011-03-20 NOTE — Assessment & Plan Note (Signed)
OFFICE VISIT   TARRON, KROLAK  DOB:  1940-11-15                                       08/29/2010  [id number]#:[id number]   Mr. Eggenberger returns today for evaluation of swelling in the right lower  extremity.  He underwent a right femoral-popliteal bypass graft with  saphenous vein by me in March of this year for severe claudication and  his claudication symptoms have been completely relieved.  He has noted  swelling from the foot to the knee over the past 6 months which has  improved slightly but continues to be bothersome as the day progresses.  He has no history of deep venous thrombosis or thrombophlebitis.  Dr.  Phyllis Ginger, his medical doctor ordered a venous duplex exam last week in  [facility] which revealed no evidence of deep venous thrombosis.   Chronic medical problems:  1. Hypertension.  2. Hyperlipidemia.  3. Coronary artery disease, previous MI in 1992.  4. History of a pacemaker with defibrillator inserted in 2003 by Dr.      Graciela Husbands   SOCIAL HISTORY:  The patient is single, has 2 children and is retired.  He works as a Engineer, materials.  He has not smoked since 2003.  Does not  use alcohol.   FAMILY HISTORY:  Strongly positive for coronary artery disease in both  parents and stroke in his father.   REVIEW OF SYSTEMS:  Positive for a TIA in the past 10 years ago which  has not returned, arthritis, joint pain, history of atrial fibrillation.  All other systems are negative review of systems in review of systems.   PHYSICAL EXAMINATION:  Blood pressure 110/60, heart rate 64,  respirations 18.  GENERAL:  He is a well-developed, well-nourished male in no apparent  distress, alert and oriented x3.  HEENT:  Exam normal for age.  EOMs intact.  LUNGS:  Clear to auscultation.  No rhonchi or wheezing.  CARDIOVASCULAR:  Regular rhythm.  No murmurs.  Carotid pulses 3+, no  bruits.  ABDOMEN:  Soft, nontender, no masses.  MUSCULOSKELETAL:  Exam is  free of major deformities.  Lower extremity exam reveals 3+ femoral and popliteal and 2+ dorsalis  pedis pulse bilaterally.  Both feet are well-perfused.  1+ edema on the  right.  No edema on the left.   I discussed with Mr. Harmon the fact that this swelling is probably  still related to his surgical incisions and removal of the great  saphenous vein.  We did fit him for short leg elastic compression  stockings today and asked him to elevate his leg at night and place the  stocking on early in the morning.  He will return to see [place] on a regular  basis for surveillance of his bilateral femoral popliteal grafts.     [name], M.D.  Electronically Signed   JDL/MEDQ  D:  08/29/2010  T:  08/30/2010  Job:  [id number]

## 2011-03-20 NOTE — Procedures (Signed)
CAROTID DUPLEX EXAM   INDICATION:  Followup known carotid artery disease.   HISTORY:  Diabetes:  No.  Cardiac:  MI in 1992.  Hypertension:  Yes.  Smoking:  Quit 27 years ago.  Previous Surgery:  Left carotid endarterectomy.  CV History:  Amaurosis Fugax No, Paresthesias No, Hemiparesis No                                       RIGHT             LEFT  Brachial systolic pressure:         132               129  Brachial Doppler waveforms:         Biphasic          Biphasic  Vertebral direction of flow:        Antegrade         Antegrade  DUPLEX VELOCITIES (cm/sec)  CCA peak systolic                   54                60  ECA peak systolic                   95                75  ICA peak systolic                   67                86  ICA end diastolic                   16                25  PLAQUE MORPHOLOGY:                  Heterogenous      None  PLAQUE AMOUNT:                      Mild              None  PLAQUE LOCATION:                    ICA               None   IMPRESSION:  1. 1-39% stenosis noted in the right internal carotid artery.  2. Normal carotid duplex noted in the left internal carotid artery,      status post left carotid endarterectomy .  3. Antegrade bilateral vertebral arteries.   ___________________________________________  [name], M.D.   MG/MEDQ  D:  11/24/2007  T:  11/25/2007  Job:  [id number]

## 2011-03-20 NOTE — Procedures (Signed)
BYPASS GRAFT EVALUATION   INDICATION:  Follow-up left lower extremity bypass graft.  No new  complaints.  History of bilateral renal artery PTA and stenting in the  distant past.   HISTORY:  Diabetes:  No.  Cardiac:  MI in 1992 with a history of a pacemaker placement.  Hypertension:  Yes.  Smoking:  Quit in 1982.  Previous Surgery:  Left SPBG with nonreversed saphenous vein by Dr.  Hart Rochester from May 06, 2002.   REFERRING PHYSICIAN:   SINGLE LEVEL ARTERIAL EXAM                               RIGHT              LEFT  Brachial:                    108                110  Anterior tibial:             66                 100  Posterior tibial:            80 (0.73)          108 (0.98)  Peroneal:  Ankle/brachial index:        0.73               0.08   PREVIOUS ABI:  Date: January 2007  RIGHT:  0.78  LEFT:  1   LOWER EXTREMITY BYPASS GRAFT DUPLEX EXAM:   DUPLEX:  Patent left lower extremity bypass graft without evidence of  stenosis with biphasic flow throughout.  The left common femoral artery  demonstrates a triphasic wave form indicating no significant inflow  disease.   IMPRESSION:  1. Patent left lower extremity bypass graft without evidence of      stenosis.  2. Essentially unchanged bilateral ankle brachial indices.   ___________________________________________  [name], M.D.   AR/MEDQ  D:  05/27/2007  T:  05/28/2007  Job:  [id number]

## 2011-03-20 NOTE — Letter (Signed)
December 12, 2007    Peter M. Swaziland, M.D.  [address], [address]  [place], [place] [address]   RE:  [name], [name]  MRN:  [id number]  /  DOB:  10/24/1941   Dear Theron Arista,   Mr. Palladino comes in today for ICD follow-up and tells me that he at the  time of his carotid endarterectomy was told that he had an intercurrent  ICD discharge.  This was interrogated and in fact he did, he had fast  ventricular tachycardia requiring shock therapy for termination.   He also has complained of some peripheral edema.   MEDICATIONS:  1. Sotalol 80 b.i.d. (dosed for his renal insufficiency)  2. Norvasc 5.  3. Hydrochlorothiazide 12.5.  4. Altace 5.  5. Lipitor 80.  6. Niaspan.  7. Aspirin as well as Lopressor and Plavix.   PHYSICAL EXAMINATION:  His blood pressure is 108/61 with a pulse of 5.  NECK:  Neck veins were flat.  Carotids were brisk and full.  LUNGS:  Clear.  HEART:  Sounds were regular.  EXTREMITIES:  1+ edema.   Interrogation of his Medtronic device demonstrates no intercurrent  episodes since about April when his R-wave was 570, impedance was 736  and threshold was 1 volt a 0.2. He did have an intercurrent episode of  ventricular tachycardia as noted previously, which we were able to  define to have occurred on February 24th occurring abruptly with a cycle  length of 300 milliseconds. It was almost polymorphic in nature and was  terminated by shock therapy.   IMPRESSION:  1. Ventricular tachycardia - intercurrently.  2. Status post ICD initially for primary prevention now with      appropriate therapy.  3. Ischemic heart disease.  4. Peripheral edema.   As we talked Theron Arista, I have gone ahead and put him on Lasix 20. He is to  call your office in 2 weeks for electrolyte assessment. We have  discontinued his hydrochlorothiazide.   We will plan to see him again in one year's time and he will be followed  remotely in the interim.    Sincerely,      Duke Salvia,  MD, [facility]  Electronically Signed    SCK/MedQ  DD: 12/12/2007  DT: 12/13/2007  Job #: [id number]

## 2011-03-20 NOTE — H&P (Signed)
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   HISTORY AND PRESENT ILLNESS:  This 70 year old male patient has a long  history of vascular occlusive disease, having previously undergone  aortobifemoral bypass grafting and bilateral renal artery bypass, left  femoral popliteal bypass grafting.  He was being followed for his lower  extremity occlusive disease and his left femoral popliteal graft, which  is functioning nicely, and a left carotid bruit was heard and carotid  duplex exam revealed a high-grade left internal carotid stenosis,  approximating 90% with no flow reduction on the right side.  He denies  any hemispheric or nonhemispheric TIA's, amaurosis fugax, diplopia,  blurred vision or syncope, and is scheduled for left carotid  endarterectomy.   PAST MEDICAL HISTORY:  1. Hypertension.  2. Hyperlipidemia.  3. Coronary artery disease, status post myocardial infarction, 1992.      He also has a pacemaker with a defibrillator, which was inserted by      Dr. Berton Mount in 2003 and replaced at a later date.  He has been      followed by Dr. Peter Swaziland, who will be evaluating his cardiac      status on July 23.  He has no history of diabetes, congestive heart      failure, COPD or stroke.   FAMILY HISTORY:  Positive for coronary artery disease in his mother and  father, positive for stroke in his father, negative for diabetes.   SOCIAL HISTORY:  He works part-time as a Engineer, materials at [employer].  He quit smoking in 1992, drinks an occasional beer.   ALLERGIES:  To DEMEROL.   REVIEW OF SYSTEMS:  Denies chest pain, dyspnea on exertion, PND,  orthopnea, palpitations, also denies any weight-loss, weight-gain,  anorexia, productive cough, bronchitis, asthma, wheezing or any GI or GU  symptoms.  Does not  have severe claudication symptoms at present, being  able to walk four blocks, but does have some mild right, greater than  left, hip discomfort at that point.  He is able to walk three miles per  day.   MEDICATIONS:  1. Sotalol 80 mg one pill b.i.d.  2. Metoprolol 50 mg one pill b.i.d.  3. Plavix 75 mg one pill daily.  4. Lipitor 40 mg one pill daily.  5. Norvasc 5 mg one pill daily.  6. Hydrochlorothiazide 12.5 mg one pill daily.  7. Bufferin 325 mg one pill daily.  8. Niaspan 1500 mg daily.  9. Multivitamins one tablet daily.  10.Nitro-Quick 0.04 mg as needed.  11.Altace 5 mg one daily.   PHYSICAL EXAM:  Blood pressure 105/66, heart rate 57, respirations are  18.  Generally:  He is a healthy-appearing male in no apparent distress,  alert and oriented times three.  His neck is supple, 3+ carotid pulses.  There is a harsh bruit over the left carotid bifurcation.  Chest is  clear to auscultation.  His neurologic exam is normal.  Cardiovascular  exam reveals a regular rhythm with no murmurs.  Upper extremity pulses  are 3+ bilaterally.  His abdomen is soft, nontender, with no masses.  No  evidence of ventral hernia with well-healed midline incision.  He has 3+  femoral and 2+ dorsalis pedis pulses bilaterally with well-perfused  lower  extremities.   IMPRESSION:  1. Severe asymptomatic left internal carotid stenosis.  2. Coronary artery disease, currently asymptomatic.  History of remote      myocardial infarction.  3. Hypertension.  4. Hyperlipidemia.   PLAN:  To admit the patient on July 25 for an elective left carotid  endarterectomy.  Risks and benefits have been thoroughly discussed with  the patient, who would like to proceed.   [name], M.D.  Electronically Signed   JDL/MEDQ  D:  05/27/2007  T:  05/28/2007  Job:  [id number]   cc:   [name], Dr.

## 2011-03-20 NOTE — Procedures (Signed)
BYPASS GRAFT EVALUATION   INDICATION:  Left femoral to popliteal bypass graft.   HISTORY:  Diabetes:  No  Cardiac:  MI  Hypertension:  Yes  Smoking:  Previous  Previous Surgery:  Left fem-pop 05/2002   SINGLE LEVEL ARTERIAL EXAM                               RIGHT              LEFT  Brachial:                    119                120  Anterior tibial:             47                 97  Posterior tibial:            29                 118  Peroneal:  Ankle/brachial index:        0.39               0.98   PREVIOUS ABI:  Date: 05/23/2009  RIGHT:  0.59  LEFT:  1.08   LOWER EXTREMITY BYPASS GRAFT DUPLEX EXAM:   DUPLEX:  Left bypass graft has triphasic Doppler waveforms throughout  Bypass graft and native arteries.  Right lower extremity has monophasic  waveforms in the CFA and SFA distal popliteal and below the knee,  waveforms are absent in the SFA proximal to mid section.  Plaque is  noted throughout.   IMPRESSION:  1. Patent bypass graft with no evidence of stenosis.  2. Left ankle-brachial index is within normal limits.  3. Right ankle-brachial index suggests severe disease.        ___________________________________________  Quita Skye. Hart Rochester, M.D.   CJ/MEDQ  D:  11/29/2009  T:  11/29/2009  Job:  [id number]

## 2011-03-20 NOTE — Procedures (Signed)
BYPASS GRAFT EVALUATION   INDICATION:  Followup right lower extremity graft   HISTORY:  Diabetes:  no  Cardiac:  yes  Hypertension:  yes  Smoking:  Quit in 2003  Previous Surgery:  Right fem pop graft 01/13/2010, left fem pop graft  05/2002, bilateral renal bypass, left CEA.   SINGLE LEVEL ARTERIAL EXAM                               RIGHT              LEFT  Brachial:                    119                104  Anterior tibial:             102                121  Posterior tibial:            109                125  Peroneal:  Ankle/brachial index:        0.92               1.05   PREVIOUS ABI:  Date:  06/27/2010  RIGHT:  0.95  LEFT:  1.02   LOWER EXTREMITY BYPASS GRAFT DUPLEX EXAM:  Patent right limb of  aortobifemoral graft as inflow to right fem pop graft.  Patent right fem pop graft with elevated velocities and plaque observed  at the distal above knee anastomosis.  Triphasic waveforms observed throughout.   DUPLEX:   IMPRESSION:  1. Widely patent right fem pop graft with elevated velocities at      distal anastomosis.  2. Slight decrease of right ankle brachial indices from previous      report.   ___________________________________________  Quita Skye. Hart Rochester, M.D.   LT/MEDQ  D:  10/12/2010  T:  10/12/2010  Job:  [id number]

## 2011-03-20 NOTE — Procedures (Signed)
BYPASS GRAFT EVALUATION   INDICATION:  Followup left fem-pop bypass graft.   HISTORY:  Diabetes:  No.  Cardiac:  MI in 1992.  Hypertension:  Yes.  Smoking:  Quit in 1982.  Previous Surgery:  Please see above.   SINGLE LEVEL ARTERIAL EXAM                               RIGHT              LEFT  Brachial:                    132                129  Anterior tibial:             62                 134  Posterior tibial:            87                 131  Peroneal:  Ankle/brachial index:        0.66               1.02   PREVIOUS ABI:  Date: 05/27/07  RIGHT:  0.73  LEFT:  0.98   LOWER EXTREMITY BYPASS GRAFT DUPLEX EXAM:   DUPLEX:  Patent left fem-pop bypass graft with no evidence of focal  stenosis.   IMPRESSION:  1. Patent left femoropopliteal bypass graft with no evidence of focal      stenosis.  2. Moderately abnormal ankle brachial index with monophasic Doppler      waveforms noted in the right leg.  3. Normal ankle brachial index with biphasic Doppler waveforms noted      in the left leg, status post left femoropopliteal bypass graft.   ___________________________________________  Quita Skye. Hart Rochester, M.D.   MG/MEDQ  D:  11/24/2007  T:  11/25/2007  Job:  [id number]

## 2011-03-20 NOTE — Procedures (Signed)
CAROTID DUPLEX EXAM   INDICATION:  Left carotid bruit.   HISTORY:  Diabetes:  No.  Cardiac:  MI in 1992, pacemaker in the past.  Hypertension:  Yes.  Smoking:  Quit in 1982.  Previous Surgery:  Left femoral to popliteal artery bypass graft with  nonreversed saphenous vein on May 06, 2002, by Dr. Hart Rochester.  CV History:  Amaurosis Fugax No.  Paresthesias No.  Hemiparesis No.                                       RIGHT             LEFT  Brachial systolic pressure:         108               110  Brachial Doppler waveforms:         Triphasic         Triphasic  Vertebral direction of flow:        Antegrade         Antegrade  DUPLEX VELOCITIES (cm/sec)  CCA peak systolic                   72                65  ECA peak systolic                   75                65  ICA peak systolic                   69                312  ICA end diastolic                   22                129  PLAQUE MORPHOLOGY:                  Mixed             Calcified  PLAQUE AMOUNT:                      Mild              Large  PLAQUE LOCATION:                    ICA               ICA   IMPRESSION:  1. 20-39% right internal carotid artery stenosis.  2. 80-99% left internal carotid artery stenosis.   ___________________________________________  [name], M.D.   DP/MEDQ  D:  05/27/2007  T:  05/28/2007  Job:  [id number]

## 2011-03-23 NOTE — Consult Note (Signed)
[place]  Patient:    [name], [name] Visit Number: [id number] MRN: [id number]          Service Type: DSU Location: [phone] Attending Physician:  Colvin Caroli Dictated by:   Wilber Bihari. Caryn Section, M.D. Proc. Date: 03/26/02 Admit Date:  03/26/2002 Discharge Date: 03/27/2002   CC:         [name], M.D.  Arvilla Market, Cardiologist, [place], [place].   Consultation Report  HISTORY OF PRESENT ILLNESS: Mr. Laskaris is a very nice 70 year old white man seen by Dr. Tenny Craw because of claudication. He was admitted today for arteriography. Serum creatinine of 2.4 was noted prior to admission. Mr. Bussey was given IV fluids and Mucomyst prior to the procedure. Renal consultation was requested. Mr. Arquette notes no knowledge of prior renal disease.  FINDINGS AT ARTERIOGRAPHY: 1. Severe bilateral renal artery stenosis (90% on the right and 95% on the    left according to Dr. Hart Rochester). 2. Aneurysmal dilatation of the aorta with irregular common iliac arteries. 3. Severe (80%) narrowing of the right SFA with three vessel run off. 4. Multiple lesions left SFA (60-90%) with patent below the knee popliteal    artery and three vessel runoff.  PAST MEDICAL HISTORY: Myocardial infarction in 1992 with PTCA and stents (Dr. Arvilla Market - Cardiologist in [place], [place].).  PAST SURGICAL HISTORY: Elbow, knee, and back surgery.  REVIEW OF SYSTEMS: Positive for claudication. No decreased force of urinary stream. No renal colic. No edema. No hematuria. No angina. No CHF.  ADMISSION MEDICATIONS: 1. Benicar 40/D (angiotensin - 2 blocker). 2. Lipitor 20/D. 3. Atenolol 50/D. 4. Norvasc 5/D. 5. Plavix 75/D. 6. HCTZ 25/D. 7. Niacin 100/D. 8. ASA 325/D. 9. Nitroglycerin 0.4 mg p.r.n.  PRIMARY CARE PHYSICIAN: [name], M.D., [address], [address], [place], [place]. [address]. Told me values for serum CR are as follows:         DATE:                   CR 1. September 2001             1.6 2. August 2002                1.7 3. January 2003               2.2 4. March 03, 2002             2.4  SOCIAL HISTORY: Mr. Caterino was born in [place]. He moved her in 70. He graduated from [employer]. Attended two years of college at [employer]. He is retired from [employer]. and is a Electrical engineer at [employer]. He has a 50 pack year history of cigarette and still smokes "a little". He is divorced. He lives with his friend Trina Ao).  FAMILY HISTORY: Father died of ASCVD and lung cancer at age 59. Mother died of CHF at age 70. He has a younger brother in good health. One son, age 27 and one daughter age 48. Both are in good health.  PHYSICAL EXAM: (Just out of the angio suite).  VITAL SIGNS: Temperature 97.3. Pulse 54, respirations 20, blood pressure 160/84.  NECK: No bruits.  CHEST: Clear.  HEART: Bradycardic. No rub or murmur.  ABDOMEN: Nontender. No organs or masses are felt. No emboli are seen. Bowel sounds are present.  GU: Circumcised penis.  RECTAL: Not done.  EXTREMITIES: No rash, edema, or atheroemboli.  LABORATORY DATA:  Hematocrit 42, WBC 750, PLTS 178, BUN 44, CR2 0.4, albumin 4.2.  IMPRESSION: 1. Extensive ASCVD with a prior MI/PTCA in 1992. 2. PVD with (bilateral renal artery stenosis). 3. Aneurysmal dilatation of the aorta with irregular common iliac arteries. 4. Right SFA narrowing. 5. Multiple lesions left SFA. 6. High BP. 7. Renal insufficiency with gradual decline in renal function over the last    three years (see above). Questionable etiology. Could be renal artery    stenosis or other causes.  RECOMMENDATIONS: 1. Per Dr. Hart Rochester, will need Cardiology clearance if surgery is planned. 2. Hold Benicar now (after contrast) and check creatinine in the morning. 3. Renal ultrasound, SBEP, UPEP, 24 hour urine for protein and creatinine.    Save left arm for vascular access. Renal  artery surgery may be needed for    preservation of renal function, more than BP control. Easier BP control may    be possible.  Will follow. Dictated by:   Wilber Bihari. Caryn Section, M.D. Attending Physician:  Colvin Caroli DD:  03/26/02 TD:  03/29/02 Job: [id number]

## 2011-03-23 NOTE — Letter (Signed)
December 10, 2008    Frank M. Swaziland, MD  [id number]  [place], [place] [address]   RE:  WORTHY, BOSCHERT  MRN:  [id number]  /  DOB:  January 24, 1941   Dear Theron Arista,   Mr. Maimone comes in today for his followup of his ICD implanted for  primary prevention in the setting of ischemic heart disease.  He has  had, as you recall, intercurrent therapy for VT that was somewhat  polymorphic.  He had an episode as you also know in August where he was  admitted to [facility] with chest pain, apparently ruled out for  myocardial infarction, underwent stress testing that was unrevealing and  that issue has now been quiescent.  He is unaware of any ventricular  tachycardic episodes.  His exercise tolerance he did quite well without  chest pain or shortness of breath.   MEDICATIONS:  1. Sotalol 80 b.i.d. dating back to many years.  2. He is also on metoprolol 50 b.i.d.  3. Norvasc 5.  4. Lipitor 80.  5. Plavix 75.  6. Lasix 20.  7. Niaspan.  8. Altace.  9. Synthroid.   PHYSICAL EXAMINATION:  VITAL SIGNS:  His blood pressure is 120/66.  His  weight is 218.  His pulse is 51.  NECK:  Veins are flat.  LUNGS:  Clear.  HEART:  Sounds are regular.  EXTREMITIES:  Without edema.   Electrocardiogram demonstrated sinus rhythm at 50 with intermittent  isorhythmic ventricular pacing.   Interrogation of Medtronic device demonstrated a R-wave of 5.7,  impedance of 800, and threshold was 1 volt at 0.2.  Battery voltage  3.07.  There were three episodes noted of ventricular tachycardia  intercurrently, two of them responded to one episode of ATP, the other  required two bursts of ATP.  There are 2 distinct morphologies suggested  by the far-field electrogram.   IMPRESSION:  1. Ischemic heart disease with depressed left ventricular function.  2. Status post implantable cardioverter-defibrillator implanted for      primary prevention now with intercurrent therapy.      a.      Polymorphic ventricular tachycardia.      b.     Monomorphic ventricular? x2 morphologies.  3. Bradycardia with isorhythmic ventricular pacing.  4. Sotalol therapy  for ventricular tachycardia dosed for renal      insufficiency.  5. Renal insufficiency.   Mr. Ackerley is doing really pretty well.  He is having more ventricular  tachycardia.  These spells have been asymptomatic.  He is bradycardic  and I am not quite sure whether the right thing to do is to back down  his ventricular pacing rate or to decrease his medications.   I will plan to touch base with you and then follow up with Mr. Tomkins  about that.   Thank you very much Theron Arista.    Sincerely,      Duke Salvia, MD, [facility]  Electronically Signed    SCK/MedQ  DD: 12/10/2008  DT: 12/11/2008  Job #: [id number]

## 2011-03-23 NOTE — Discharge Summary (Signed)
NAME:  LADARIOUS, [name] NO.:  [id number]   MEDICAL RECORD NO.:  [id number]                   PATIENT TYPE:   LOCATION:                                       FACILITY:   PHYSICIAN:  Quita Skye. Hart Rochester, M.D.               DATE OF BIRTH:  June 28, 1941   DATE OF ADMISSION:  05/03/2002  DATE OF DISCHARGE:  05/24/2002                                 DISCHARGE SUMMARY   ADDENDUM:  The patient was admitted on May 03, 2002, and underwent  resection and grafting of abdominal aortic aneurysm via aortic  endarterectomy after femoral bypass grafting with a 16 x 8 mm Dacron  Hemashield graft.  On May 06, 2002, the patient underwent left femoral below-  knee popliteal bypass graft with nonreversed saphenous vein.  The patient  tolerated these procedures and did well.  He was actually set up to go home  on May 14, 2002.  That a.m., the patient developed some ventricular  tachycardia followed by fever.  An EP consult was obtained.  Eventually it  was recommended that the patient undergo ICD.  The patient nausea and  vomiting on May 17, 2002.  He underwent defibrillator implantation on May 18, 2002, by Duke Salvia, M.D.  The patient did well with this.  The  following a.m., he got up and had an abdominal dehiscence which was repaired  by Di Kindle. Edilia Bo, M.D., that day.  The patient was returned to the  floor and over the next several days made slow steady progress until he was  ultimately ready for discharge home on May 24, 2002.   DISCHARGE ACTIVITY:  Light to moderate.  No lifting over 10 pounds.  No  driving.  No strenuous activity.   WOUND CARE:  The patient is to clean his incision with clean soap and water.   LABORATORY DATA:  Discharge Pathology:  There does not appear to be any on  the chart that I can find.   DISPOSITION:  The patient did well after his abdominal dehiscence and made a  normal slow recovery.  By May 24, 2002, he was doing  well.  The blood  pressure was slightly elevated at 150/94.  It was ultimately decided to  discharge the patient home on May 24, 2002.   DISCHARGE MEDICATIONS:  There is no discharge list of medications, but the  discharge instruction sheet shows that the patient went home on a renal  vitamin one q.d., Lipitor 20 mg q.d., aspirin 325 mg q.d., Norvasc 5 mg  q.d., Lopressor 50 mg b.i.d., sotalol 80 mg b.i.d., multivitamins one q.d.,  and Percocet one to two p.o. q.4h. p.r.n. for pain.  The patient was to  resume hydrochlorothiazide 25 mg q.d.    FOLLOW-UP:  Follow-up with Quita Skye. Hart Rochester, M.D., was to be arranged in two  weeks.  Follow-up with Duke Salvia, M.D., was  set for August 03, 2002.   CONDITION ON DISCHARGE:  Stable and improving.     Eber Hong, P.A.                 [name], M.D.    WDJ/MEDQ  D:  10/08/2002  T:  10/09/2002  Job:  [id number]   cc:   Peter M. Swaziland, M.D.  [address], [address]  [place], [place] [address]  Fax: [phone]   Raynald Kemp, M.D.  [address]  [place]  [place] [address]  Fax: [phone]

## 2011-03-23 NOTE — Letter (Signed)
December 17, 2006    Peter M. Swaziland, M.D.  [address]  [place], [place] [address]   RE:  [name], [name]  MRN:  [id number]  /  DOB:  05-25-1941   Dear Theron Arista:   [name] comes in for ICD follow up.  He has had no intercurrent  discharges.  Energy level is terrific.  He is working 5 days a week and  playing golf.   MEDICATIONS:  1. Altace.  2. Lopressor.  3. Aspirin.  4. Sotalol 80 b.i.d.   EXAMINATION:  His blood pressure is 116/67, his pulse was 57.  LUNGS:  Clear.  HEART SOUNDS:  Regular.   Interrogation of his Medtronic [phone] ICD demonstrates a battery voltage  of 3.14 with an R wave of 6.6, an impedance of 752 with high voltage  impedance of 42 with a proximal coil impedance of 54.  The pacing  threshold was 1 volt at 0.2 milliseconds.  There were multiple  nonsustained episodes but no sustained and no therapies delivered.   IMPRESSION:  1. Ventricular tachycardia.  2. Ischemic heart disease.  3. Status post implantable cardioverter-defibrillator.  4. Sotalol for #1.  5. Renal insufficiency.   [name] is doing quite well.  His last creatinine that I have from a  year ago was 1.8, so his sotalol dose is okay.  Electrocardiogram that  he brought with him from his primary physician demonstrated a QT  interval of 460 milliseconds, 480 milliseconds with a QTc of about 460  milliseconds, which is adequate.   Will follow him remotely via [facility] and see him again in 1 year.    Sincerely,      Duke Salvia, MD, [facility]  Electronically Signed    SCK/MedQ  DD: 12/17/2006  DT: 12/17/2006  Job #: [id number]

## 2011-03-23 NOTE — Op Note (Signed)
[place]  Patient:    [name], [name] Visit Number: [id number] MRN: [id number]          Service Type: SUR Location: 2000 2013 01 Attending Physician:  Colvin Caroli Dictated by:   [name], M.D. Proc. Date: 05/06/02 Admit Date:  05/03/2002                             Operative Report  PREOPERATIVE DIAGNOSIS:  Ischemic left foot, secondary to severe superficial femoral, popliteal, and tibial occlusive disease.  POSTOPERATIVE DIAGNOSIS:  Ischemic left foot, secondary to severe superficial femoral, popliteal, and tibial occlusive disease.  OPERATION PERFORMED:  Left common femoral to popliteal (below-the-knee), bypass using a nonreverse translocated saphenous vein graft from the left leg with an intraoperative arteriogram.  SURGEON:  Quita Skye. Hart Rochester, M.D.  FIRST ASSISTANT:  Caralee Ates, M.D.  SECOND ASSISTANT:  Sherrie Devyn, P.A.  ANESTHESIA:  General endotracheal.  DESCRIPTION OF PROCEDURE:  The patient was taken to the operating room and placed in the supine position, at which time satisfactory general endotracheal anesthesia was administered.  The left leg was prepped with Betadine scrub and solution, and was draped in the routine sterile manner.  Skin staples were removed from the left inguinal incision, the patient having undergone an aortobifemoral bypass grafting and bilateral renal artery bypass grafting 48 hours earlier.  The distal end of the left limb of the aortobifemoral graft was encircled for proximal control, and the common femoral, superficial femoral, and profunda femoris arteries were also encircled.  The saphenous vein was exposed at the saphenofemoral junction and then removed down to the midcalf through multiple small incisions along the medial aspect of the left leg.  Its branches were ligated with #4-0 and #5-0 silk ties and divided.  It was removed and gently dilated with heparinized saline  and marked for orientation purposes.  It was a very satisfactory vein, being about 3.5 mm throughout.  The popliteal artery was exposed in the below-the-knee position, where it was a soft vessel with some mild disease and was pulseless.  A subfascial  anatomic tunnel was then created.  The patient was then heparinized.  The femoral vessels were occluded with vascular clamps.  A longitudinal opening was made in the distal end of the background anastomosis over the common femoral artery, and enlarged with the Pott scissors, and the proximal end of the vein was spatulated and anastomosed end-to-side using continuous #6-0 Prolene.  Clamps were then released, and there was a good pulse down to the first set of competent valves.  Using a retrograde valvulotome the valves were rendered incompetent, with a result in excellent flow out of the distal end of the vein graft.  The vein was carefully delivered through the tunnel, and then anastomosed end-to-side to the below-the-knee popliteal artery with #6-0 Prolene.  A small Fogarty catheter #3 was passed down the distal popliteal artery, down to the ankle level, and no thrombus or debris was removed, and there was very sluggish inflow from above.  The Fogarty had also been passed down the superficial femoral artery through the proximal arteriotomy, to see if any debris had embolized during the first procedure, and there was no thrombus in the superficial femoral artery, with total occlusion of about 25.0 cm distally.  After anastomosing to the popliteal artery, the clamps were released, and there was an excellent pulse in the vein graft, and biphasic  Doppler flow in the posterior tibial and anterior tibial arteries in the ankle.  A very limited arteriogram was performed, with only 15 cc of contrast through the distal anastomosis.  This revealed a widely patent anastomosis to the below-the-knee popliteal artery with three-vessel runoff.  The anterior  tibial artery was in severe spasm, but did fill all the way across the ankle joint.  It was quite small throughout. The posterior tibial artery filled through the proximal two-thirds of the leg and was the best vessel.  The peroneal artery was small and diseased. Protamine was then given to reverse the heparin.  Following adequate hemostasis, the wounds were irrigated with saline and closed in layers with Vicryl and clips.  A sterile dressing was applied.  The patient was taken to the recovery room in satisfactory condition. Dictated by:   [name], M.D. Attending Physician:  Colvin Caroli DD:  05/06/02 TD:  05/08/02 Job: [id number]

## 2011-03-23 NOTE — Discharge Summary (Signed)
[place]  Patient:    [name], [name] Visit Number: [id number] MRN: [id number]          Service Type: MED Location: [id number] Attending Physician:  [place], Peter Manning Dictated by:   Peter M. [place], M.D. Admit Date:  04/22/2002 Discharge Date: 04/24/2002   CC:         Angelena Sole., M.D. [place], [place]  Quita Skye. Hart Rochester, M.D.   Discharge Summary  HISTORY OF PRESENT ILLNESS:  The patient is a 70 year old male with a prior myocardial infarction in 1992 with subsequent angioplasty. He recently presented with increasing claudication symptoms, increased blood pressure and was found to have severe peripheral vascular disease and renal artery stenosis. Preoperative evaluation with an Adenosine Cardiolite study, he was noted to have a large area of scar inferiorly with severely depressed left ventricular dysfunction with an ejection fraction of 24%. A cardiac catheterization was recommended for further risk stratification.  The patient does have a history of chronic renal insufficiency with baseline creatinine of 2.4. Repeat creatinine prior to admission was 2.7. It was recommended he be admitted for IV hydration prior to this procedure. For details of his past medical history, social history, family history and physical examination, please see the admission history and physical.  LABORATORY DATA:  His most recent chest x-ray on Mar 25, 2002, showed no acute disease. His ECG showed normal sinus rhythm with LVH and evidence of old inferior infarction.  Sodium 140, potassium 4.8, chloride 109, CO2 26, BUN 46, creatinine 2.5, glucose 159. Coags were normal. White count 6400, hemoglobin 12.6, platelet count 154,000.  HOSPITAL COURSE:  The patient was admitted to telemetry. His Benicar and hydrochlorothiazide were held. He was started on Mucomyst 600 mg b.i.d. He was hydrated with D5 1/2 normal saline at 75 cc per hour. The following day  his BUN had dropped to 38 with a creatinine of 2.2.  He subsequently underwent cardiac catheterization via the right femoral access. This demonstrated scattered 30% lesions in the left anterior descending coronary artery. The second diagonal vessel had a 70% stenosis in the mid vessel. The left circumflex coronary artery also demonstrated a 70% stenosis in the mid vessel. The right coronary artery was severely diffusely diseased. There was approximately 60 mm of vessel that was essentially just a thread with severe diffuse 90% to 95% stenosis. Left ventricular angiography showed extensive inferior wall akinesia with moderate global hypokinesia with overall ejection fraction of approximately 25%.  Following the procedure, the patient was continued on hydration and Mucomyst. His subsequent BUN declined to 27 and creatinine to 1.9. He had good urine output. He had no complications or left groin hematoma. Based on his cardiac catheterization findings, it was felt that he would be best managed medically. The right coronary artery was not amenable to catheter based intervention and supplied an area of extensive infarction. While it did have moderate disease in the mid circumflex in the second diagonal branch, there was no evidence of ischemia noted on his Cardiolite study in these areas.  His most significant prognostic indicator is his poor left ventricular function, and it was recommended that we continue on medical therapy. It was felt that he would benefit from revascularization of his lower extremities and kidneys to try and preserve his kidney function, and also revascularization of his lower extremities would allow him to be more active. The patient was discharged to home on April 24, 2002, in stable condition.  DISCHARGE DIAGNOSIS 1.  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 2. Ischemic left ventricular dysfunction. 3. Chronic renal insufficiency. 4.  Peripheral vascular disease. 5. Renal artery stenosis. 6. Hypertension. 7. Hypercholesterolemia.  DISCHARGE MEDICATIONS 1. Lipitor 20 mg q.d. 2. Benicar 40 mg q.d. 3. Hydrochlorothiazide 25 mg q.d. His Benicar and hydrochlorothiazide will be    resumed tomorrow, April 25, 2002. 4. Atenolol was reduced to 25 mg q.d. due to marked bradycardia. 5. Norvasc 5 mg q.d. 6. Plavix 75 mg q.d. 7. Slow niacin 500 mg 2 tablets b.i.d. 8. Aspirin 325 mg q.d.  FOLLOWUP:  The patient is to follow up with Dr. Hart Rochester in one week to consider surgery for his renal artery stenosis and vascular disease.  DISCHARGE STATUS: Stable. Dictated by:   Peter M. [place], M.D. Attending Physician:  [place], Peter Manning DD:  04/24/01 TD:  04/25/02 Job: [id number]

## 2011-03-23 NOTE — H&P (Signed)
[place]  Patient:    [name], [name] Visit Number: [id number] MRN: [id number]          Service Type: MED Location: [id number] Attending Physician:  [place], Peter Manning Dictated by:   Peter M. [place], M.D. Admit Date:  04/22/2002   CC:         Quita Skye. Hart Rochester, M.D.  Eula Listen. Karin Lieu., M.D., [place], [place]   History and Physical  DATE OF BIRTH:  Sep 19, 2041  HISTORY OF PRESENT ILLNESS:  The patient is a 70 year old white male with a known history of atherosclerotic coronary artery disease.  He reports that he suffered a myocardial infarction in 1992.  He was treated at [facility] and reports that he had angioplasty at that time of three vessels.  Since then, he denies any recurrent anginal symptoms or shortness of breath.  He does note that he has been having difficulty recently with claudication symptoms in both legs.  He has also had more difficult to control blood pressure.  He underwent recent evaluation by Quita Skye. Hart Rochester, M.D., who found that he had bilateral renal artery stenosis and severe peripheral vascular disease involving both lower extremities.  In addition, the patient has a history of TIA two years ago, resulting in double vision.  He reports that his carotid ultrasound at that time was okay and he was started on Plavix.  The patient has a history of chronic renal insufficiency and reports that his baseline creatinine is 2.4.  He had no difficulty with his prior angiogram. He was treated with Mucomyst prior to that procedure.  For evaluation of his cardiac status, the patient was referred for adenosine Cardiolite study, which was performed on April 07, 2002.  This study demonstrated a large fixed defect involving the inferior wall and inferior apex with minimal peri-infarct ischemia.  There was marked left ventricular dysfunction with an ejection fraction of 24%.  Because of his high-risk Cardiolite study, it is  recommended that he undergo cardiac catheterization.  Initially this was scheduled as an outpatient, but his laboratory work demonstrated an elevated BUN of 151 and a creatinine of 2.7.  For that reason, the patient will be admitted the day prior to his procedure for IV hydration and institution of Mucomyst therapy.  PAST MEDICAL HISTORY: 1. Remote myocardial infarction in 1992. 2. Status post PTCA x 3. 3. History of TIA in 2001. 4. Renal artery stenosis. 5. Severe peripheral vascular disease involving his lower extremities with    claudication. 6. Hypertension. 7. Hypercholesterolemia.  There is no history of diabetes.  PAST SURGICAL HISTORY: 1. Surgery for his back, knees, and elbows. 2. 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 3. He has had previous tonsillectomy.  ALLERGIES:  He is allergic to DEMEROL.  CURRENT MEDICATIONS: 1. Lipitor 20 mg per day. 2. Benicar 40 mg per day. 3. Atenolol 50 mg per day. 4. Norvasc 5 mg per day. 5. Plavix 75 mg per day. 6. HCTZ 25 mg daily. 7. Slo-Niacin 500 mg two tablets b.i.d. 8. Aspirin 325 mg per day.  SOCIAL HISTORY:  The patient is retired.  He does work part-time in a Medical laboratory scientific officer at [employer].  He quit smoking 11 years ago.  He walks regularly.  He drinks occasional alcohol.  He is married and has two healthy children.  FAMILY HISTORY:  His father died at age 4 with a myocardial infarction and stroke.  His mother died at age  86 with congestive heart failure.  One brother is alive and well.  REVIEW OF SYSTEMS:  The patient complains of bilateral lower extremity claudication.  He has had no recent TIA or CVA symptoms.  He denies orthopnea or PND.  He has had no abdominal pain or back pain.  All other review of systems are negative.  PHYSICAL EXAMINATION:  The patient is a pleasant white male in no apparent distress.  WEIGHT:  215 pounds.  VITAL SIGNS:  The blood pressure is 166/90 and the pulse is 48  and regular.  HEENT:  Pupils are equal, round, and reactive to light and accommodation. Extraocular movements are full.  The oropharynx is clear.  NECK:  Supple without JVD, adenopathy, thyromegaly, or bruits.  LUNGS:  Clear.  CARDIAC:  Regular rate and rhythm.  Normal S1 and S2 without murmurs, rubs, or clicks.  ABDOMEN:  Soft and nontender without masses.  There is a very faint mid abdominal bruit.  EXTREMITIES:  There are bilateral femoral bruits.  His femoral pulses are 1+ and symmetric.  The posterior tibial pulses are 1+ and symmetric.  DP pulses are trace.  LABORATORY DATA:  His rest ECG showed normal sinus rhythm with LVH.  There are Q waves inferiorly in leads 2, 3, and aVF consistent with old inferior infarction.  The sodium is 141, potassium 5.2, chloride 110, CO2 21, glucose 108, BUN 51, creatinine 2.7, and calcium 9.5.  Other labs are pending.  IMPRESSION: 1. Atherosclerotic coronary artery disease, status post remote myocardial    infarction and status post angioplasty x 3. 2. Severe left ventricular dysfunction. 3. Hypertension. 4. Renal artery stenosis. 5. Peripheral vascular disease with claudication. 6. Chronic renal insufficiency. 7. Hyperlipidemia.  PLAN:  The patient will be admitted for IV hydration.  Will hold his Benicar and HCTZ.  He will be treated with Mucomyst 600 mg b.i.d.  If his renal function improves, will plan on proceeding with cardiac catheterization the following day with further therapy pending these results. Dictated by:   Peter M. [place], M.D. Attending Physician:  [place], Peter Manning DD:  04/21/02 TD:  04/22/02 Job: [id number]

## 2011-03-23 NOTE — Discharge Summary (Signed)
[place]. [name]  Patient:    [name], [name] Visit Number: [id number] MRN: [id number]          Service Type: SUR Location: 2000 2013 01 Attending Physician:  Colvin Caroli Dictated by:   Loura Pardon, P.A. Admit Date:  05/03/2002 Disc. Date: 05/13/02   CC:         [name], M.D., [place]  Dr. [place], Cardiologist   Discharge Summary  DATE OF BIRTH: 01-21-1941  DISCHARGE DIAGNOSES: 1. Severe iliac occlusive disease, small abdominal aortic aneurysm,    bilateral renal artery stenosis, bilateral superficial femoral artery    occlusive disease, and claudication symptoms. 2. Acute and chronic renal insufficiency postoperative (stage III NKF).    Baseline creatinine on May 03, 2002. Creatinine was 1.5 on postop    day seven, May 11, 2002. 3. Acute blood loss anemia postoperative requiring transfusion with two    units on the day of surgery, two units of postoperative day three, and    one unit postoperative day four. 4. Nonsustained ventricular tachycardia, treated with Lidocaine drip for    six days after surgery, resolving with beta blocker treatment.  SECONDARY DIAGNOSES: 1. History of atherosclerotic coronary artery disease, status post MI    in 1992 with subsequent PTCA. (He had had a total of three PTCAs to    date). 2. Severe left ventricular dysfunction with an ejection fraction estimated    at 25%. 3. Hypertension. 4. Chronic renal insufficiency. 5. Recent left heart cath on April 23, 2002. The study shows that the left    anterior descending had diffuse disease up to 30%. The second diagonal    had a 70% stenosis, the left circumflex had a 70% mid point stenosis and    the right coronary artery had a severe plaque involving the entire    segment. 6. History of tobacco habituation, having quit eleven years ago. 7. History of transient ischemic attack in 2001. 8. Strong family history of atherosclerotic coronary  artery disease. 9. Status post tonsillectomy. 10.Status post bilateral arthroplasties to the knees, elbow surgery, and    back surgery. 11.Hyperlipidemia.  PROCEDURE: 1. May 04, 2002 - Resection and grafting of abdominal aortic aneurysm, aortic    endarterectomy, aortobifemoral bypass placement of 16 x 8 mm Dacron heme-    shield graft with bilateral renal artery bypasses (end-to-side with 6 mm    Dacron conduit). 2. May 06, 2002 - Left femoral to below the knee popliteal bypass using non-    reverse greater saphenous vein as conduit. Dr. Hart Rochester performed both    surgeries. 3. Ankle brachial indexes on May 05, 2002 after the first surgery. Ankle    brachial index on the right was 0.64 and on the left was not obtainable.    Ankle brachial index on May 07, 2002 after the second surgery on the right    was 0.77 and on the left was 0.87. A very dramatic increase in flow.  DISCHARGE DISPOSITION: [name] is ready for discharge on May 13, 2002. This would be postoperative day nine after undergoing resection and grafting of his abdominal aortic aneurysm and placement of an aortobifemoral bypass and bilateral renal artery bypasses. His creatinine has improved dramatically from baseline of 3.1 on the day before this surgery to a creatinine of 1.5 on postoperative day seven. He was seen in the postoperative period by renal service, who investigated the level of parathyroid hormone, also the ferritin and  total iron binding constant, which were all negative. [name] was maintained on renal dose Dopamine in the postoperative period until postoperative day three when serum creatinine showed slight improvement. His creatinine on postoperative day eight is 1.7 which is well below his baseline creatinine on admission. [name] also experienced episodes of nonsustained ventricular tachycardia which were asymptomatic and was maintained on a Lidocaine drip for the first six days after  surgery. As blood pressure tolerated increased beta blocker, the Lidocaine drip was weaned and he has maintained sinus rhythm with very few premature ventricular complexes on increased dose of beta blockers throughout postoperative days seven and eight. It is not sure whether his ventricular dysrhythmias are long term or secondary to postoperative stress. It is recommended by Cardiology that he undergo Holter testing for a 24 hour period in three to four weeks after discharge, after circulating catecholamines reach baseline. If he still has nonsustained ventricular tachycardia, he would be a candidate for electrophysiological studies and possible AICD placement. [name] also exhibited postoperative acute blood loss anemia. He received transfusions as dictated above, five units of packed red blood cells in all. He was also started on Epogen on postoperative day two, subcutaneous every Monday, Wednesday, and Friday with improvement in both his hemoglobin and platelet counts. His hemoglobin was 9.2 on postoperative day eight. Secondary to abdominal surgery, [name] was maintained on N.P.O. status until postoperative day four when he began clear liquids. He was advanced to full liquids on postoperative day five and was able to tolerate a soft diet beginning postoperative day six. He is having full GI tract function. He does have a decreased appetite secondary to difficulty appreciating the taste of food. [name] pain is well controlled with postoperative oral analgesia. He is taking Percocet tabs presently for this. He does complain of pain of the left calf and this is most probably secondary to post ischemic pain. He did have severe ischemia to the left lower extremity as evidence by absent ankle brachial index, obtained on the left on postoperative day one, May 05, 2002. He subsequently underwent femoral to below the knee popliteal bypass with great improvement in ankle brachial  index to 0.87 on the left on postoperative  day there after undergoing bypass to the left lower extremity. There is some possibility that he is experiencing cellulitis in the left calf and he may well receive oral antibiotics for this. In addition, he has had some fevers postoperative day eight and urinalysis and culture of the central line catheter chip are pending. Preliminary studies on the cath show no evidence of WBCs and no organism seen but these tests will be followed up before discharge. [name] is ambulating independently although pain in the left calf hampers his walking ability to some extent.  DISCHARGE MEDICATIONS: 1. Percocet 5/325 one to two tabs p.o. q.4-6h. p.r.n. pain. 2. Lopressor 50 mg p.o. b.i.d. 3. Lipitor 20 mg daily, preferably at bedtime. 4. Enteric coated aspirin 325 mg daily. 5. Plavix 75 mg daily. 6. Slo-Niacin 500 mg two tabs b.i.d. 7. Nephro-Vite daily. 8. Folic acid 400 mcg two tabs daily.  ACTIVITY: To walk daily to build up his strength. He is asked not to drive until he sees Dr. Hart Rochester.  DISCHARGE DIET: Low sodium, low cholesterol diet.  WOUND CARE: He may shower daily, keeping his left leg elevated when in the bed or in the chair. A dry gauze is to be placed to the left groin each morning after showering.  FOLLOW-UP: He will see Dr. Hart Rochester in follow-up on Tuesday, May 26, 2002 and will get a BMET at the [facility] one hour before the visit. He will also see the nurses at the [facility] on Tuesday, May 19, 2002 to have staples removed.  HISTORY OF PRESENT ILLNESS: [name] is a 70 year old male referred by Dr. Ronne Binning for evaluation of severe lower extremity claudication. He was seen by Dr. Hart Rochester in May of 2003 with claudication after walking short distances. He has had no rest pain and no ulcerations to the lower extremities. Ankle brachial index were done which suggested femoral to popliteal  occlusive disease.  Ankle brachial indexes were 0.87 bilaterally. Dr. Hart Rochester performed angiography after IV hydration for chronic renal insufficiency. The angiography showed a small abdominal aortic aneurysm as well as bilateral renal artery stenosis and aortoiliac occlusive disease. Dr. Hart Rochester recommended proceeding with aortobifemoral bypass grafting with resection of the small abdominal aortic aneurysm, as well as bilateral renal artery bypass grafts. [name] was cleared from a cardiac standpoint. He underwent left heart cath on April 23, 2002. The study showed severe three vessel coronary artery disease with depressed left ventricular function and EF of 24%. He however, was cleared by Cardiology for surgery and reports for elective surgery on May 04, 2002.  HOSPITAL COURSE: Is as described above in the discharge disposition. [name] has recuperated at [facility] for eight days after undergoing resection and grafting of abdominal aortic aneurysm with placement of aortobifemoral bypass and bilateral renal artery bypasses as well as a subsequent left femoral to below the knee popliteal bypass on May 06, 2002. He has been followed by the renal service and by the cardiology service. He has been appropriately treated for acute blood loss anemia. He has been maintained on renal dose Dopamine for chronic renal insufficiency. He has been maintained on IV Lidocaine for frequent nonsustained ventricular tachycardia which is resolving. His acute blood loss anemia has also resolved. His creatinine is well below baseline. He is ambulating well. Mental status has been clear. He did have some fever on postoperative day seven and eight. Cultures of the urine and cath tip are pending at this time. Fevers tend to be intermittent and may not hinder discharge planned for May 13, 2002. Dictated by:   Loura Pardon, P.A. Attending Physician:  Colvin Caroli DD:  05/12/02 TD:   05/15/02 Job: [phone]

## 2011-03-23 NOTE — Op Note (Signed)
NAME:  OREL, COOLER NO.:  [id number]   MEDICAL RECORD NO.:  [id number]                   PATIENT TYPE:  OIB   LOCATION:  2899                                 FACILITY:  [facility]   PHYSICIAN:  Duke Salvia, M.D.               DATE OF BIRTH:  14-May-1941   DATE OF PROCEDURE:  02/17/2004  DATE OF DISCHARGE:                                 OPERATIVE REPORT   PREOPERATIVE DIAGNOSIS:  Previously implanted defibrillator for primary  prevention with subsequent appropriate therapies associated with a Medtronic  device now on advisory recall.   POSTOPERATIVE DIAGNOSIS:  Previously implanted defibrillator for primary  prevention with subsequent appropriate therapies associated with a Medtronic  device now on advisory recall.   PROCEDURE:  Explantation of a previously implanted device and implantation  of a new device with intraoperative defibrillation threshold testing.   DESCRIPTION OF PROCEDURE:  Following obtaining informed consent, the patient  was brought to the electrophysiology laboratory and placed on the  fluoroscopic table in the supine position.  After routine prep and drape,  lidocaine was infiltrated in the line along the previous incision and  carried down to the layer of the device pocket using sharp dissection.  The  pocket was opened and the device was explanted.   Interrogation of the previously implanted 6944 65 cm dual coil active  fixation defibrillator lead serial number [id number] demonstrated an R wave  of 11 millivolts with a pacing impedance of 927 and a pacing threshold of  0.4 volts at 0.5 milliseconds and a current threshold of 0.4 MA.   With these acceptable parameters recorded, the lead was then attached to a  Medtronic Maxima 7232CX defibrillator serial number [id number].  Through the  device, the bipolar R wave was 6.1 millivolts with a pacing impedance of 832  ohms and a pacing threshold of 1 volt at 0.2 milliseconds.  The  distal coil  impedance was 47, the proximal coil impedance was 59 ohms.   With these acceptable parameters recorded, the defibrillation threshold  testing was undertaken.  Ventricular fibrillation was induced via the T wave  shock.  After a total duration of 6 seconds, a 10 joule shock was delivered  through a measured resistance of 45 ohms failing to terminate ventricular  fibrillation.  After a total duration of 12 seconds, a 20 joule shock was  delivered through a measured resistance of 43 ohms, now successfully  terminating ventricular fibrillation and restoring sinus rhythm.  At this  point, the device was implanted and hemostasis was assured.  The pocket was  copiously irrigated with antibiotic-containing saline solution and the wound  was then closed in three layers in the normal fashion.  The wound was  washed, dried, and Benzoin Steri-Strips dressing was applied.  Needle,  sponge, and instrument counts correct at the end of the procedure according  to the staff.  The patient tolerated the procedure  without apparent  complications.   It was notable that the patient's initial DFT was 10 joules so the failure  of 10 joules and success of 20 joules was a little bit surprising.  The  patient, however, tolerated this without apparent complications.                                               Duke Salvia, M.D.    SCK/MEDQ  D:  02/17/2004  T:  02/18/2004  Job:  [id number]   cc:   Peter M. [place], M.D.  [address], [address]  [place], [place] [address]  Fax: [phone]   [facility]

## 2011-03-23 NOTE — Procedures (Signed)
[place]. [facility]  Patient:    [name], [name] Visit Number: [id number] MRN: [id number]          Service Type: SUR Location: 2000 2013 01 Attending Physician:  Colvin Caroli Dictated by:   Nathen May, M.D., [facility] Proc. Date: 05/18/02 Admit Date:  05/03/2002   CC:         Peter M. [place], M.D.  Electrophysiology Laboratory  [facility]   Procedure Report  PREOPERATIVE DIAGNOSIS:  Ischemic heart disease.  Prolonged ventricular tachycardia.  Depressed left ventricular function.  Broad QRS.  POSTOPERATIVE DIAGNOSIS:  Ischemic heart disease.  Prolonged ventricular tachycardia.  Depressed left ventricular function.  Broad QRS.  PROCEDURE PERFORMED:  Defibrillator implantation with intraoperative defibrillation threshold testing.  PROCEDURE:  Following obtaining an informed consent, the patient was brought to the electrophysiology laboratory table and placed on the fluoroscopic table in the supine position.  After routine prep and drape of the left upper chest, intravenous contrast was injected via left antecubital vein to identify the course and the patency of the thoracic left subclavian vein.  This having been accomplished, lidocaine was infiltrated in the prepectoral subclavicular region.  An incision was made and carried down to the layer of the prepectoral fascia using sharp dissection and electrocautery.  A pocket was formed similarly. Hemostasis was obtained.  Attention was turned to gain access to the extrathoracic left subclavian vein which was accomplished without difficulty without the aspiration of air, puncturing the artery.  A single venipuncture was accomplished.  A guide wire was placed and retained, and a 0 silk suture was placed in figure-of-eight fashion and allowed to hang loosely.  Subsequently, a 9-French tear-away introducer sheath was placed through which was then passed a Medtronic 6944 65  cm Qattro passive fixation dual-coiled defibrillator lead, serial #[id number].  Under fluoroscopic guidance, it was manipulated to the right ventricular apex with a bipolar R-wave of 6 mV and an impedance of 1,400 ohms with a patient threshold of 0.5 msec or 0.6 volts. Currently, the threshold was 0.7 ma, and there was no diaphragmatic pacing at 10 volts.  With these acceptable parameters recorded, the lead was secured to the prepectoral fascia.  The hemostasis suture was secured, and the lead was then attached to a Medtronic Marquis [id number] ICD, serial #[id number].  Through this device, a bipolar R wave of 6 mv with a pace impedance of 1312 ohms and a patient threshold of 0.5 msec, 0.5 volts. The distal coil impedance was 38 ohms, and the proximal coil impedance was 47 ohms.  With these acceptable parameters recorded, defibrillation threshold testing was undertaken.  Ventricular fibrillation was induced using T wave shock.  After a total duration of 5 seconds, a ______ shock was delivered through a measured resistance of 36 ohms.  Terminated ventricular fibrillation was sinus rhythm.  After a wait of 6-7 minutes, ventricular fibrillation was reintroduced via the T wave shock.  After a total duration of 5 seconds, a ______ shock was delivered through a 34-joule impedance.  Terminated ventricular fibrillation was sinus rhythm.  With these acceptable parameters recorded, the system was implanted.  The pocket was copiously irrigated with antibiotic contained saline solution. Lead and the pulse generator were pocket and were secured to the prepectoral fascia.  The wound was closed in three layers in a normal fashion.  The wound was washed and dried, and a Steri-Strip and Benzoin dressing was applied. Needle counts, sponge counts, and instrument counts were correct  according to the staff.  COMPLICATIONS:  None.  The patients device was programmed to the pain free zone  device. Dictated by:   Nathen May, M.D., [facility] Attending Physician:  Colvin Caroli DD:  05/18/02 TD:  05/19/02 Job: [id number]

## 2011-03-23 NOTE — Op Note (Signed)
[place]. [facility]  Patient:    [name], [name] Visit Number: [id number] MRN: [id number]          Service Type: SUR Location: 2000 2013 01 Attending Physician:  Colvin Caroli Dictated by:   Di Kindle. Edilia Bo, M.D. Proc. Date: 05/19/02 Admit Date:  05/03/2002 Discharge Date: 05/24/2002                             Operative Report  PREOPERATIVE DIAGNOSIS: Abdominal wound dehiscence.  POSTOPERATIVE DIAGNOSIS:  Abdominal wound dehiscence.  PROCEDURE: Repair of abdominal wound dehiscence.  SURGEON: Di Kindle. Edilia Bo, M.D.  ASSISTANT:  Lissa Merlin, PA.  ANESTHESIA:  General anesthesia.  INDICATIONS:  The patient is a 70 year old gentleman who had undergone aortofemoral bypass grafting and bilateral renal artery bypass grafts.  He was doing well and was actually scheduled for discharge today. He apparently had an episode of nausea and some coughing and dehisced the central part of his incision.  He was taken to the operating room for closure of the abdominal wound dehiscence.  TECHNIQUE:  The patient was taken to the operating room and received a general anesthetic.  The abdomen was prepped and draped in the usual sterile fashion. The suture was identified at both ends of the dehiscence and clamped. I then placed a #1 Prolene suture at each end, tied it and then tied the old suture to the suture at each end to secure both ends.  I freed the omentum from the posterior fascia and then placed interrupted #1 Prolene sutures the length of the dehiscence.  In addition, I placed four retention sutures.  The interrupted sutures were tied and the retention sutures were tied.  The wound was irrigated with antibiotic solution.  The skin was closed with staples. A sterile dressing was applied.  The patient tolerated the procedure well and was transferred to the recovery room in satisfactory condition. All needle, instrument and sponge counts  were correct. Dictated by:   Di Kindle Edilia Bo, M.D. Attending Physician:  Colvin Caroli DD:  05/19/02 TD:  05/22/02 Job: [id number]

## 2011-03-23 NOTE — H&P (Signed)
. [facility]  Patient:    [name], [name] Visit Number: [id number] MRN: [id number]          Service Type: CAT Location: *N Attending Physician:  [place], Peter Manning Dictated by:   Lissa Merlin, P.A. Adm. Date:  05/03/02                           History and Physical  DATE OF BIRTH: [birth year]  PRIMARY CARE PHYSICIAN: Dr. Ronne Binning.  CARDIOLOGIST: Dr. [place].  CHIEF COMPLAINT: Bilateral lower extremity claudication and aortoiliac occlusive disease, renal insufficiency.  HISTORY OF PRESENT ILLNESS: This is a 70 year old white male, referred by Dr. Ronne Binning for evaluation of severe lower extremity claudication.  He was seen by Dr. Hart Rochester back in May 2003 with claudication after walking short distances. He has had no rest pain and no ulcerations.  ABIs were done which suggested fem-pop occlusive disease.  ABIs were 0.87 bilaterally.  Dr. 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  Dr. Hart Rochester then recommended proceeding with aortobifemoral bypass grafting with resection of this small aneurysm and bilateral renal artery bypass grafts.  Before this [name] needed to be cleared from a cardiac standpoint.  He was sent to Dr. [place], who performed a cardiac catheterization on April 23, 2002.  This showed severe three-vessel coronary artery disease with a depressed left ventricular function and EF of 24%.  He was cleared for surgery by cardiology.  Today [name] reports no new symptoms and says he has been feeling very well.  PAST MEDICAL HISTORY:  1. Severe peripheral vascular disease.  2. Chronic mild renal insufficiency, with a baseline creatinine of 2.4-2.5.  3. Previous history of TIAs.  4. Coronary artery disease with myocardial infarction in 1992 and previous     percutaneous  intervention x3.  5. Hypertension.  6. Hypercholesterolemia.  He denies a history of diabetes.  PAST SURGICAL HISTORY:  1. He has had back, knee, and elbow surgery.  2. He has had a fatty tumor removed from his head.  3. He has had tonsillectomy.  MEDICATIONS:  1. Plavix 75 mg p.o. q.d.  2. Norvasc 5 mg p.o. q.d.  3. Atenolol 25 mg p.o. q.d.  4. Benicar 40 mg p.o. q.d.  5. Lipitor 20 mg p.o. q.d.  6. Hydrochlorothiazide 25 mg p.o. q.d.  7. Slo-Niacin 500 mg two tablets b.i.d.  8. Enteric-coated aspirin 325 mg one p.o. q.d.  ALLERGIES: DEMEROL.  LABORATORY DATA: ABI today shows right ABI 0.76, left ABI 0.72.  Carotid Doppler showed no hemodynamically significant ICA stenosis.  FAMILY HISTORY: Mother deceased at age 52 from CHF.  Father deceased at age 81 from MI and CVA.  One brother alive and well.  SOCIAL HISTORY: He is divorced.  He has a girlfriend.  He has two healthy children.  He quit smoking 11 years ago after 1-1/2 packs per day for 26 years.  He is retired and works part-time as a Electrical engineer at [employer].  He has occasional alcohol use.  REVIEW OF SYSTEMS: GENERAL: He reports no fever, chills, or weakness.  He has been feeling his usual overall good state of health.  CARDIOVASCULAR: No chest pain or palpitations.  No TIA symptoms.  He does have continued claudication in the lower extremities after walking short distances or  up stairs. PULMONARY: He denies shortness of breath or cough.  GI: He denies problems with his bowel movements.  GU: He denies urinary difficulty.  HEENT: He denies visual disturbances, hearing problems, or problems in his mouth.  PHYSICAL EXAMINATION:  VITAL SIGNS: BP 158/84, pulse 62 and regular, respirations 18.  GENERAL: Well-nourished, well-developed 70 year old white male, who appears robust and fit, cheerful.  HEENT: Normocephalic, atraumatic.  PERRL.  Visual acuity intact OU.  EOMI OU.  NECK: Supple with no JVD,  no bruits.  No lymphadenopathy.  CHEST: Symmetrical.  Breath sounds are clear and equal anterior and posterior.  CARDIAC: Regular rate and rhythm, S1 and S2, with no murmurs, rubs, or gallops.  ABDOMEN: Soft, nontender, nondistended.  Bowel sounds present in all four quadrants.  No masses felt.  No bruits heard.  EXTREMITIES: Warm and well perfused.  No ulceration or edema.  VASCULAR: Carotid pulses are 3+ bilaterally with no bruits.  Femoral pulses 2+ on the left with a soft bruit, 3+ on the right with a soft bruit.  There are 1+ posterior tibial pulses.  Dorsalis pedis pulses not felt.  NEUROLOGIC: Grossly nonfocal.  Cranial nerves II-XII intact.  Gait is steady. Muscular strength is +5/5 throughout.  Deep tendon reflexes are 2+.  ASSESSMENT/PLAN: This is a 70 year old white male, who appears in overall good health but has progressive life-style limiting lower extremity claudication, high blood pressure, and mild renal insufficiency.  Dr. Hart Rochester recommends aortobifemoral bypass graft with resection of small aneurysm and bilateral aortorenal artery bypass grafting.  He will be brought into the hospital the day before for intravenous hydration.  Risks, benefits, details, and alternatives have been discussed and it is agreed to proceed.  He has had cardiac clearance prior to the procedure.  He feels well and is stable for surgery. Dictated by:   Lissa Merlin, P.A. Attending Physician:  [place], Peter Manning DD:  04/30/02 TD:  05/02/02 Job: [id number]

## 2011-03-23 NOTE — Op Note (Signed)
[place]. [facility]  Patient:    [name], [name] Visit Number: [id number] MRN: [id number]          Service Type: SUR Location: 2300 2312 01 Attending Physician:  Colvin Caroli Dictated by:   [name], M.D. Proc. Date: 05/04/02 Admit Date:  05/03/2002   CC:         [name], M.D., [place], [place]  Wilber Bihari. Caryn Section, M.D.   Operative Report  PREOPERATIVE DIAGNOSES:  1. Infrarenal abdominal aortic aneurysm.  2. Iliac occlusive disease.  3. 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  POSTOPERATIVE DIAGNOSES:  1. Infrarenal abdominal aortic aneurysm.  2. Iliac occlusive disease.  3. 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  OPERATION/PROCEDURE:  1. Resection and grafting of infrarenal abdominal aortic aneurysm with     insertion of an aortobifemoral common bypass using a 16 x 8 mm Hemashield     Dacron graft with proximal aortic endarterectomy.  2. Aorta to right renal artery bypass (end-to-side) using a 6 mm Hemashield     graft.  3. Aorta to left renal artery bypass (end-to-side) using a 6 mm Hemashield     Dacron graft.  SURGEON: Quita Skye. Hart Rochester, M.D.  FIRST ASSISTANT: Di Kindle. Edilia Bo, M.D.  SECOND ASSISTANT: Lissa Merlin, P.A.  ANESTHESIA: General endotracheal.  PROCEDURE: The patient was taken to the operating room and placed in the supine position, at which time satisfactory general endotracheal anesthesia was administered.  A Swan-Ganz catheter and radial arterial line were inserted by anesthesia.  The abdomen and groins were prepped with Betadine scrub and solution and draped in routine sterile manner.  Longitudinal incisions were made in both inguinal regions and carried down through subcutaneous tissue and the common superficial and profunda femoris arteries dissected free and encircled with vessel loops.  There were posterior plaques in both  common femoral arteries with diminished pulses.  A midline incision was made in the abdomen and carried down through the linea albumin from the xiphoid to just below the umbilicus.  The peritoneal cavity was entered and thoroughly explored.  The stomach, duodenum, small bowel and colon were unremarkable. The liver was smooth with no palpable masses.  The gallbladder appeared normal and no stones were palpable.  The transverse colon was elevated and the intestines reflected to the right side.  The retroperitoneum was incised, exposing an aneurysmal aorta from the renal arteries to the bifurcation.  The aorta was fusiformly dilated about 3.5 mm in diameter throughout. Circumferential control was obtained distal to the renal arteries.  The left renal vein was mobilized after ligating and dividing the adrenal branch.  The left renal artery was exposed out to its primary branches and the right renal artery also exposed out to its primary branches.  Both had very diminished pulses.  Both common iliac arteries were encircled with umbilical tapes.  The patient was then given 25 g Mannitol and a tunnel was created posterior to the ureters.  The patient was then heparinized.  The aorta was occluded distal to the renal arteries with a vascular clamp, the left renal artery being lower than the right.  Both common iliac arteries were ligated with umbilical tapes. At this point it was noted that the urine output had dropped to 0, having been adequate up to this point.  There was Doppler flow in the distal left renal artery but no Doppler flow was audible in the distal right renal artery. Therefore, it  was decided to proceed with the aortorenal bypass grafting after doing the aortic anastomosis.  The aorta had to be endarterectomized.  It was clamped proximally below the renals and opened longitudinally.  A large amount of laminated thrombus was removed and the lumbar was oversewn with 2-0  silk figure-of-eight sutures.  The aneurysm was transected about 3 cm distal to the renal arteries and endarterectomized up to the clamp, and a 16 x 8 mm Hemashield Dacron graft anastomosed end-to-end to the aortic stump using continuous 3-0 Prolene, buttressing this with a strip of felt.  This was checked for leaks and none were present.  A 20 mm cuff was placed over the endarterectomized segment of aorta.  The right renal artery was then occluded proximally and distally with vascular clamps, opened longitudinally with a 15 blade, extended with the Potts scissors, and a 6 mm Hemashield graft spatulated and anastomosed end-to-side using continuous 6-0 Prolene.  The clamps were then released after 20 minutes of ischemia time.  The aortic graft was re-occluded and opened with a 15 blade and a large 5 mm punch on the right anterolateral aspect and the 6 mm graft was spatulated and anastomosed end-to-side using continuous 5-0 Prolene.  Following this the left renal artery was occluded proximally and distally with vascular clamps, opened with a 15 blade, extended with the Potts scissors, and a 6 mm Hemashield graft spatulated and anastomosed end-to-side using continuous 6-0 Prolene.  The clamps were then opened, with 20 minutes of ischemia time on the left as well. The graft was tunneled behind the renal vein and anastomosed end-to-side to the anterolateral aspect of the aortic graft using a partial occlusion clamp so that the right kidney continued to be perfused.  At this point in time the patient began diuresing and diuresed throughout the remainder of the case. After completing the left renal artery anastomosis a clamp was placed on both iliac limbs.  The limbs were delivered through the tunnels and end-to-side anastomoses were done to the common femoral arteries bilaterally, the right first and then the left, with no significant hypotension when opening. Protamine was then given to reverse  the heparin.  Following adequate  hemostasis the groins were closed in layers with Vicryl in subcuticular fashion with clips.  Adequate hemostasis was achieved in the abdomen.  The aneurysm was closed over the graft with 3-0 Vicryl.  The retroperitoneum was approximated with 3-0 Vicryl.  The fascia was closed with #1 Prolene and the skin with clips.  A sterile dressing was applied.  The patient was taken to the recovery room in satisfactory condition.  He received 4 units of Cell Saver blood and 2 units of blood from the Blood Bank.  He made 600 cc of urine after the renal artery clamps were removed, and was stable hemodynamically. Dictated by:   [name], M.D. Attending Physician:  Colvin Caroli DD:  05/04/02 TD:  05/05/02 Job: [id number]

## 2011-03-23 NOTE — Consult Note (Signed)
[place]. [facility]  Patient:    [name], [name] Visit Number: [id number] MRN: [id number]          Service Type: SUR Location: 2000 2013 01 Attending Physician:  Colvin Caroli Dictated by:   Nathen May, M.D., [facility] Proc. Date: 05/16/02 Admit Date:  05/03/2002   CC:         Peter M. [place]il[place], M.D.  [name], M.D.  Dr. Ronne Binning  Electrophysiology Laboratory  [facility]   Consultation Report  Thank you very much for asking me to see this patient in electrophysiological consultation for prolonged episodes of ventricular tachycardia in the setting of ischemic heart disease.  HISTORY OF PRESENT ILLNESS:  This patient is a 70 year old gentleman with ischemic disease, prior myocardial infarction about 10 years ago, an ejection fraction of 24% and a recent negative Cardiolyte who has had no antecedent history of tachycardia, palpitations, or syncope.  He was admitted to the hospital approximately two weeks ago because of progressive claudication and renal insufficiency and underwent an aortobifemoral to bilateral renal revascularization procedure and subsequently a lower extremity revascularization procedure which was successful and was actually associated with a normalization of his renal function.  However, in the wake of his surgery, he has had repeated episodes of nonsustained ventricular tachycardia despite escalating doses of beta blockers and most recently episodes of greater than 20-25 beats associated with some degree of light-headedness.  The patient also has an antecedent history of a TIA.  At baseline prior to his claudication, his exercise tolerance was good. He was able to walk three miles a day without nocturnal dyspnea or peripheral edema.  REVIEW OF SYSTEMS:  Noncontributory.  PAST SURGICAL HISTORY:  Notable for tonsillectomy, back surgery, knee surgery, elbow surgery, lipoma resection, and his  vascular surgery.  CURRENT MEDICATIONS:  Medications at present include Lopressor 100 b.i.d., Nephro-Vite, Epogen, Zocor 40 h.s., Pepcid, Keflex, aspirin, and Norvasc.  ALLERGIES:  He is allergic to Demerol.  PHYSICAL EXAMINATION:  GENERAL:  This patient is an older Caucasian male in no acute distress.  VITAL SIGNS:  His blood pressure is 134/84, and his pulse is 72.  His respirations were 16 and unlabored.  HEENT: Demonstrated no ______ xanthomata.  NECK: His neck veins were 6-7 cm, and his carotids were brisk bilaterally without bruits.  BACK:  Without kyphosis or scoliosis.  LUNGS:  Clear.  HEART:  Heart sounds were regular without murmurs or gallops.  ABDOMEN:  Had active bowel sounds and no hepatomegaly.  There was a stapled incision in his abdomen along his groins bilaterally and down his left leg. There was a lot of ecchymosis here.  EXTREMITIES:  Without edema.  NEUROLOGICAL:  Grossly normal.  SKIN:  The skin apart from the ecchymosis was warm and dry.  NEUROLOGICAL:  Grossly normal.  LABORATORY DATA:  Electrocardiogram dated June 29 demonstrated sinus rhythm at 50 with intervals of 0.17/0.13/0.47 with evidence of an inferior wall myocardial infarction, left ventricular hypertrophy with QRS widening, and some repolarization abnormalities.  Telemetry demonstrates nonsustained ventricular tachycardia--repeated--with a cycle length of up to 330 msec and episodes as long as 26 beats.  IMPRESSION: 1. Ventricular tachycardia--NS--26 beats--cycle length 330 msec. 2. Light-headedness, associated with #1. 3. Ischemic cardiopathy.    A. Status post myocardial infarction.    B. Ejection fraction of 24%.    C. Negative recent Cardiolite. 4. QRS duration greater than 120. 5. Renal insufficiency, now improved, following #6. 6. Status post aortobifemoral  with bilateral renal bypass grafting. 7. Transient ischemic attack, question cause. 8. Hypertension.  The patient  is at high risk for sudden cardiac death.  It is class I, prior to his recent claudication and as such, his highest risk for a cardiac mortality is arrhythmic.  With his prolonged ventricular tachycardia, his broad QRS, and his depressed left ventricular function defibrillator implantation, it is appropriate to reduce his risk of sudden cardiac death.  His episodes of ventricular tachycardia have been quite prolonged and associated with some symptoms.  Suppression of these will be important.  Maybe the higher dose of beta blocker will suffice; if not, I would be inclined to try Sotalol post amiodarone, given the patients age and his postmyocardial infarction status in the absence of heart failure.  I have discussed the potential benefits, as well as the potential risks of defibrillator implantation including but not limited to a risk of death, pneumothorax, cardiac perforation, infection, lead dislodgement.  He understands these risks and is willing to proceed.  RECOMMENDATIONS:  Based on the above, therefore:  1. Proceed with ICD implanting either late on Monday or early on Tuesday. 2. Continue beta blockers. 3. Consider Sotalol if more prolonged episodes of ventricular tachycardia    occur.  Thank you for this consultation. Dictated by:   Nathen May, M.D., [facility] Attending Physician:  Colvin Caroli DD:  05/16/02 TD:  05/19/02 Job: [id number]

## 2011-03-23 NOTE — Cardiovascular Report (Signed)
[place]. [facility]  Patient:    CORGAN, MORMILE Visit Number: [id number] MRN: [id number]          Service Type: MED Location: 3700 3714 01 Attending Physician:  [place], Peter Manning Dictated by:   Peter M. [place], M.D. Proc. Date: 04/23/02 Admit Date:  04/22/2002 Discharge Date: 04/24/2002   CC:         Fayrene Fearing D. Hart Rochester, M.D.  Dr. Rosalia Hammers, [place]   Cardiac Catheterization  INDICATIONS FOR PROCEDURE: The patient is a 70 year old white male, who has severe peripheral vascular disease and bilateral renal artery stenosis. He is status post myocardial infarction in 1992 and underwent angioplasty at that time.  Recent Cardiolite study shows large inferior wall infarct with severely depressed LV function and ejection fraction of 24%. Cardiac catheterization was recommended for further risk stratification.  ACCESS: Via the right femoral artery using the standard Seldinger technique.  EQUIPMENT: The 6 French 4 cm right and left Judkins catheter, 6 French pigtail catheter, 6 French arterial sheath.  MEDICATIONS: Local anesthesia with 1% Xylocaine.  CONTRAST: Omnipaque 120 cc.  HEMODYNAMIC DATA: Aortic pressure was 156/77, left ventricular pressure was 156 with an EDP of 15 mmHg.  ANGIOGRAPHIC DATA: Left coronary artery: The left coronary artery arises and distributes normally.  Left main: The left main coronary artery has a 20% narrowing distally.  Left anterior descending: The left anterior descending artery wraps around the apex. It has scattered irregularities throughout its length up to 30%. The second diagonal branch has a 70% stenosis in the mid vessel.  Left circumflex: The left circumflex coronary artery has a 70% stenosis in the mid vessel prior to the takeoff of a large marginal branch.  Right coronary artery: The right coronary artery is severely and diffusely diseased. From the proximal to distal segment, there is severe disease  up to 90-95% with severe plaque involving this entire segment.  LEFT VENTRICULAR ANGIOGRAPHY: The left ventricular angiography is performed in the RAO view. This demonstrates moderate to marked left ventricular enlargement. There is akinesia of the entire inferior wall with otherwise moderate hypokinesia. Overall, left ventricular systolic function is severely depressed with ejection fraction estimated at 25%. There is only minimal mitral insufficiency.  FINAL INTERPRETATION: 1. Three-vessel obstructive atherosclerotic coronary artery disease. There    is moderate disease involving the second diagonal branch in the mid left    circumflex coronary artery. The right coronary is severely and diffusely    diseased. 2. Severe left ventricular dysfunction. Dictated by:   Peter M. [place]azil[place], M.D. Attending Physician:  [place], Peter Manning DD:  04/23/02 TD:  04/25/02 Job: [id number]

## 2011-03-23 NOTE — Discharge Summary (Signed)
[name], [name] NO.:  [id number]   MEDICAL RECORD NO.:  [id number]          PATIENT TYPE:  INP   LOCATION:  3304                         FACILITY:  [facility]   PHYSICIAN:  Quita Skye. Hart Rochester, M.D.  DATE OF BIRTH:  [birth year]   DATE OF ADMISSION:  05/30/2007  DATE OF DISCHARGE:  06/01/2007                               DISCHARGE SUMMARY   DISCHARGE DIAGNOSES AND FINAL DIAGNOSES:  1. 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  2. Hypertension.  3. Hyperlipidemia.  4. Coronary artery disease status post myocardial infarction 1992.  5. Status post implantation of pacemaker with defibrillator followed      by Dr. Berton Mount.   PROCEDURES PERFORMED:  On May 30, 2007, left carotid endarterectomy  with Dacron patch angioplasty plus resection of redundant segment of  left internal carotid artery with primary anastomosis.  Surgeon Hart Rochester.  First assistant Gae Bon, P.A. student.   HISTORY OF PRESENT ILLNESS:  This 70 year old male patient has a long  history of vascular occlusive disease, undergoing previous  aortobifemoral bypass grafting and bilateral renal artery bypass and  left femoral-popliteal bypass grafting by Dr. Hart Rochester.  He was followed  for his lower extremity disease and was found to have a left carotid  bruit.  Duplex scanning revealed a high-grade 90% left internal carotid  stenosis with no flow reduction on the right side.  He had no neurologic  symptoms, either previous stroke, hemispheric or nonhemispheric TIAs or  vague symptoms.  He was scheduled for left carotid endarterectomy.   His past medical history was significant for hypertension,  hyperlipidemia, coronary artery disease with a previous myocardial  infarction 1992 and an implantable defibrillator and pacemaker.   HOSPITAL COURSE:  The patient was operated on on the day of admission  July 25, had an uneventful postoperative course.  He did have a Al Pimple  drain left in the neck which was removed the first postoperative  day.  He awoke postoperatively in the recovery room with a normal  neurologic exam and had no neurologic complications.  Blood pressure was  110/60 on postoperative day #1 with a heart rate of 60.  He had been  maintained on a small dose of dopamine at 5 mcg overnight, and that was  discontinued.  Hematocrit was 24% the first postoperative day with  potassium of 2.4 which was replaced.  He had some nausea and vomiting,  and this was treated and it resolved.  On the second postoperative day,  his left neck incision looked good.  His neurologic exam was intact with  the exception of a mild left marginal mandibular nerve paresis.  Potassium had increased to 4.2, hematocrit 36.6 following transfusion of  2 units of packed red blood cells.  He had no neurologic or cardiac  symptoms.  He was discharged with instruction to return to see Dr.  Hart Rochester in 2 weeks.  He was not to drive an automobile and was to cleanse  the incision with soap and water and to resume activities as tolerated.   Condition at the time of discharge is  improved.  Final diagnoses are as  stated above.      [name], M.D.  Electronically Signed     JDL/MEDQ  D:  07/21/2007  T:  07/22/2007  Job:  [id number]

## 2011-03-23 NOTE — Procedures (Signed)
[place]  Patient:    [name], [name] Visit Number: [id number] MRN: [id number]          Service Type: DSU Location: [phone] Attending Physician:  Colvin Caroli Dictated by:   [name], M.D. Proc. Date: 03/26/02 Admit Date:  03/26/2002 Discharge Date: 03/27/2002   CC:         [name], M.D., [address], [place], [place] [address]   Procedure Report  INDICATIONS FOR PROCEDURE: Renal insufficiency and severe left lower extremity claudication.  PROCEDURE: Abdominal aortogram with bilateral lower extremity runoff via right common femoral approach and second order selective catheterization of left common iliac artery.  SURGEON: Quita Skye. Hart Rochester, M.D.  ANESTHESIA: Local, Xylocaine, and Versed 2 mg intravenously.  COMPLICATIONS: None.  CONTRAST: One hundred and 10 cc of Visipaque.  DESCRIPTION OF PROCEDURE: The patient was taken to the [facility] peripheral endovascular laboratory and placed in the supine position, at which time both groins were prepped with Betadine solution and draped in routine sterile manner.  After infiltration with 1% Xylocaine the right common femoral artery was entered percutaneously, a guide wire passed into the suprarenal aorta under fluoroscopic guidance.  A 5 French sheath and dilator were passed over the guide wire, the dilator removed, and a standard pigtail catheter positioned in the suprarenal aorta.  Flush abdominal aortogram was performed, injecting 15 cc of contrast at 20 cc per second.  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  The left renal artery had a long severe tubular stenosis beginning at its ostium and extending out about 2 cm, causing about a 95% stenosis with a good distal vessel.  The right renal artery had an ostial stenosis approximating  90% with a large distal vessel. The renal arteries were better visualized using both the RAO and LAO projection to visualize these severe stenotic lesions.  The iliac arteries were irregular, particularly in the common iliac arteries, with some mild ulceration, but appeared to be widely patent.  There was some mild stenosis at the origin of the left external iliac and both internal iliac arteries were patent.  The pigtail catheter was removed over the guide wire and an IMA catheter was then used to cannulate the left common iliac artery and a guide wire passed down into the left external iliac artery and the IMA catheter was exchanged for an end-hole catheter positioned in the common iliac artery.  A left lower extremity angiogram was then performed, injecting 35 cc of contrast at 30 cc per second.  This revealed the left external iliac artery to be widely patent, the common femoral and profunda femoris arteries on the left were also widely patent.  There was severe diffuse disease of the superficial femoral artery on the left, with three areas of significant stenosis, one in the proximal third, one in the mid third, and at the knee joint an area of 95% stenosis.  There was plaque formation throughout the entire left superficial femoral artery.  The below-the-knee popliteal artery on the left was smooth and widely patent, and there was three-vessel runoff.  The end-hole catheter was removed and the right lower extremity angiogram performed through the sheath, injecting 30 cc of contrast.  This revealed the right external iliac artery to be widely patent, as was the right common femoral and profunda femoris arteries.  The superficial femoral  artery on the right was large but had a stenosis about 6 or 8 cm distal to its origin, approximating 80%.  There was mild irregularity throughout the entire SFA and popliteal artery to the knee joint, but it was widely patent and the below-the-knee  popliteal artery was smooth and there was three-vessel runoff on the right.  Having tolerated the procedure well the sheath was removed, adequate compression applied, and no complications ensued.  FINDINGS:  1. Severe bilateral renal artery stenosis, 95% on the left and 90% on the     right.  2. Irregular aorta and proximal common iliac arteries with aneurysmal     dilatation of the infrarenal aorta.  3. Severe left superficial femoral occlusive disease with multiple moderate     and severe stenotic lesions and a below-the-knee popliteal artery which     is widely patent with three-vessel runoff.  4. Focal 80% proximal right superficial femoral stenosis with mild     irregularity of the right superficial femoral artery and three-vessel     runoff on the right.Dictated by:   [name], M.D. Attending Physician:  Colvin Caroli DD:  03/26/02 TD:  03/27/02 Job: [id number]

## 2011-03-29 ENCOUNTER — Encounter: Payer: Self-pay | Admitting: Internal Medicine

## 2011-04-09 ENCOUNTER — Encounter: Payer: Self-pay | Admitting: Internal Medicine

## 2011-04-09 ENCOUNTER — Ambulatory Visit (INDEPENDENT_AMBULATORY_CARE_PROVIDER_SITE_OTHER): Payer: Medicare Other | Admitting: Internal Medicine

## 2011-04-09 DIAGNOSIS — I472 Ventricular tachycardia: Secondary | ICD-10-CM

## 2011-04-09 DIAGNOSIS — Z95810 Presence of automatic (implantable) cardiac defibrillator: Secondary | ICD-10-CM

## 2011-04-09 DIAGNOSIS — I428 Other cardiomyopathies: Secondary | ICD-10-CM

## 2011-04-09 NOTE — Progress Notes (Signed)
See dictated note.

## 2011-04-09 NOTE — Patient Instructions (Addendum)
Your physician wants you to follow-up in: 4 months. You will receive a reminder letter in the mail two months in advance. If you don't receive a letter, please call our office to schedule the follow-up appointment.  Your physician has recommended you make the following change in your medication:  1) Take amiodarone 400mg  one whole tablet every other day alternating with 1/2 tablet every other day.

## 2011-04-17 ENCOUNTER — Encounter: Payer: Medicare Other | Admitting: Internal Medicine

## 2011-05-23 ENCOUNTER — Other Ambulatory Visit: Payer: Self-pay | Admitting: Internal Medicine

## 2011-05-25 ENCOUNTER — Encounter: Payer: Self-pay | Admitting: Cardiology

## 2011-05-25 ENCOUNTER — Ambulatory Visit (INDEPENDENT_AMBULATORY_CARE_PROVIDER_SITE_OTHER): Payer: Medicare Other | Admitting: Cardiology

## 2011-05-25 DIAGNOSIS — I2589 Other forms of chronic ischemic heart disease: Secondary | ICD-10-CM

## 2011-05-25 DIAGNOSIS — I255 Ischemic cardiomyopathy: Secondary | ICD-10-CM

## 2011-05-25 DIAGNOSIS — I10 Essential (primary) hypertension: Secondary | ICD-10-CM

## 2011-05-25 DIAGNOSIS — E785 Hyperlipidemia, unspecified: Secondary | ICD-10-CM

## 2011-05-25 DIAGNOSIS — I739 Peripheral vascular disease, unspecified: Secondary | ICD-10-CM

## 2011-05-25 DIAGNOSIS — I2510 Atherosclerotic heart disease of native coronary artery without angina pectoris: Secondary | ICD-10-CM

## 2011-05-25 DIAGNOSIS — I472 Ventricular tachycardia, unspecified: Secondary | ICD-10-CM

## 2011-05-25 DIAGNOSIS — I509 Heart failure, unspecified: Secondary | ICD-10-CM

## 2011-05-25 MED ORDER — LEVOTHYROXINE SODIUM 112 MCG PO TABS: 112.0000 ug | ORAL_TABLET | Freq: Every day | ORAL | Status: DC

## 2011-05-25 NOTE — Assessment & Plan Note (Signed)
He has no significant anginal symptoms. He will continue dual antiplatelet therapy for at least one year.

## 2011-05-25 NOTE — Assessment & Plan Note (Signed)
Per Dr. Graciela Husbands his amiodarone dose has been mildly decreased. He will continue with his ICD followup.

## 2011-05-25 NOTE — Progress Notes (Signed)
Remigio Eisenmenger Date of Birth: [birth year]   History of Present Illness: Mr. Purdum is seen for followup today. He reports that he has been doing very well. He denies any dyspnea, chest pain, edema, or palpitations. 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 He remains active.  Current Outpatient Prescriptions on File Prior to Visit  Medication Sig Dispense Refill  . aspirin 325 MG tablet Take 325 mg by mouth daily.        [name] atorvastatin (LIPITOR) 80 MG tablet Take 1 tablet by mouth daily.      . Calcium-Magnesium-Zinc 500-250-12.5 MG TABS Take 2 tablets by mouth daily.        . clopidogrel (PLAVIX) 75 MG tablet Take 75 mg by mouth daily.        . furosemide (LASIX) 20 MG tablet Take 20 mg by mouth daily.        [name] loteprednol (LOTEMAX) 0.5 % ophthalmic suspension Place 1 drop into both eyes as directed.        . Multiple Vitamin (MULTIVITAMIN) tablet Take 1 tablet by mouth daily.       . Multiple Vitamins-Minerals (EYE VITAMINS PO) Take by mouth 2 (two) times daily.        [name] NIASPAN 500 MG CR tablet Take 3 tablets by mouth daily.      . nitroGLYCERIN (NITROSTAT) 0.4 MG SL tablet Place 0.4 mg under the tongue every 5 (five) minutes as needed.        . ramipril (ALTACE) 5 MG capsule Take 5 mg by mouth daily.        [name] DISCONTD: amiodarone (PACERONE) 400 MG tablet take 1 tablet by mouth twice a day  60 tablet  3    Allergies  Allergen Reactions  . Meperidine Hcl     Past Medical History  Diagnosis Date  . CAD (coronary artery disease)     w/ RCA disease & circumflex disease  . Bradycardia   . Ventricular tachycardia   . PVD (peripheral vascular disease)     h/o claudication  . HTN (hypertension)   . HLD (hyperlipidemia)   . Cytopenia   . Hypothyroidism   . Renal insufficiency   . Claudication   . 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 CHF (congestive heart failure)   . Ischemic cardiomyopathy   . TIA (transient ischemic attack)     Past  Surgical History  Procedure Date  . Femoral-popliteal bypass graft 2011  . Cardiac defibrillator placement     Medtronic  . Revascularization / in-situ graft leg   . Renal artery bypass   . Back surgery   . Lumbar laminectomy   . Knee surgery     bilateral  . Elbow surgery     right  . Cholecystectomy   . Lipoma excision     scalp  . Bone spur     heel  . Cataract extraction     bilateral  . Carotid stent     left    History  Smoking status  . Never Smoker   Smokeless tobacco  . Not on file    History  Alcohol Use: Not on file    Family History  Problem Relation Age of Onset  . Coronary artery disease    . Heart attack Mother     Review of Systems: The review of systems is positive for recent increase in his thyroid dose. Dr. Graciela Husbands  has slowly reduced his amiodarone dose. All other systems were reviewed and are negative.  Physical Exam: BP 104/68  Pulse 62  Ht 6' (1.829 m)  Wt 209 lb 3.2 oz (94.892 kg)  BMI 28.37 kg/m2 He is a pleasant white male in no acute distress. Normocephalic, atraumatic. Pupils are equal round and reactive to light and accommodation. Extraocular movements are full. Oropharynx is clear. Neck is without JVD, adenopathy, thyromegaly, or bruits. Lungs are clear. Cardiac exam reveals an irregular rhythm without gallop or murmur. Abdomen is soft and nontender. He has bilateral femoral bruits. He has chronic 1+ right lower extremity edema. Pedal pulses are good. He is alert and oriented x3. Cranial nerves II through XII are intact. LABORATORY DATA:   Assessment / Plan:

## 2011-05-25 NOTE — Assessment & Plan Note (Signed)
He has stable claudication symptoms. He will remain active with his walking and continue his risk factor modification.

## 2011-05-25 NOTE — Patient Instructions (Signed)
Continue your current medication.  I will see you back in 4 months.

## 2011-05-25 NOTE — Assessment & Plan Note (Signed)
He appears to be well compensated today. He is not on beta blocker therapy because of bradycardia. We will continue with his ACE inhibitor.

## 2011-05-29 ENCOUNTER — Inpatient Hospital Stay (HOSPITAL_COMMUNITY)
Admission: EM | Admit: 2011-05-29 | Discharge: 2011-05-30 | DRG: 287 | Disposition: A | Discharge status: Home / Self Care | Payer: Medicare Other | Attending: Cardiology | Admitting: Cardiology

## 2011-05-29 ENCOUNTER — Emergency Department (HOSPITAL_COMMUNITY): Payer: Medicare Other

## 2011-05-29 ENCOUNTER — Telehealth: Payer: Self-pay | Admitting: Physician Assistant

## 2011-05-29 DIAGNOSIS — I472 Ventricular tachycardia, unspecified: Secondary | ICD-10-CM | POA: Diagnosis present

## 2011-05-29 DIAGNOSIS — I739 Peripheral vascular disease, unspecified: Secondary | ICD-10-CM | POA: Diagnosis present

## 2011-05-29 DIAGNOSIS — I2582 Chronic total occlusion of coronary artery: Secondary | ICD-10-CM | POA: Diagnosis present

## 2011-05-29 DIAGNOSIS — I2510 Atherosclerotic heart disease of native coronary artery without angina pectoris: Secondary | ICD-10-CM | POA: Diagnosis present

## 2011-05-29 DIAGNOSIS — I059 Rheumatic mitral valve disease, unspecified: Secondary | ICD-10-CM

## 2011-05-29 DIAGNOSIS — Z8673 Personal history of transient ischemic attack (TIA), and cerebral infarction without residual deficits: Secondary | ICD-10-CM

## 2011-05-29 DIAGNOSIS — R079 Chest pain, unspecified: Secondary | ICD-10-CM

## 2011-05-29 DIAGNOSIS — I080 Rheumatic disorders of both mitral and aortic valves: Secondary | ICD-10-CM | POA: Diagnosis present

## 2011-05-29 DIAGNOSIS — I2589 Other forms of chronic ischemic heart disease: Secondary | ICD-10-CM | POA: Diagnosis present

## 2011-05-29 DIAGNOSIS — Z7902 Long term (current) use of antithrombotics/antiplatelets: Secondary | ICD-10-CM

## 2011-05-29 DIAGNOSIS — D696 Thrombocytopenia, unspecified: Secondary | ICD-10-CM | POA: Diagnosis present

## 2011-05-29 DIAGNOSIS — I5022 Chronic systolic (congestive) heart failure: Secondary | ICD-10-CM | POA: Diagnosis present

## 2011-05-29 DIAGNOSIS — N183 Chronic kidney disease, stage 3 unspecified: Secondary | ICD-10-CM | POA: Diagnosis present

## 2011-05-29 DIAGNOSIS — Z87891 Personal history of nicotine dependence: Secondary | ICD-10-CM

## 2011-05-29 DIAGNOSIS — I4729 Other ventricular tachycardia: Secondary | ICD-10-CM | POA: Diagnosis present

## 2011-05-29 DIAGNOSIS — I252 Old myocardial infarction: Secondary | ICD-10-CM

## 2011-05-29 DIAGNOSIS — E785 Hyperlipidemia, unspecified: Secondary | ICD-10-CM | POA: Diagnosis present

## 2011-05-29 DIAGNOSIS — I509 Heart failure, unspecified: Secondary | ICD-10-CM | POA: Diagnosis present

## 2011-05-29 DIAGNOSIS — Z95810 Presence of automatic (implantable) cardiac defibrillator: Secondary | ICD-10-CM

## 2011-05-29 DIAGNOSIS — Z9861 Coronary angioplasty status: Secondary | ICD-10-CM

## 2011-05-29 DIAGNOSIS — Z79899 Other long term (current) drug therapy: Secondary | ICD-10-CM

## 2011-05-29 DIAGNOSIS — R0789 Other chest pain: Principal | ICD-10-CM | POA: Diagnosis present

## 2011-05-29 DIAGNOSIS — I129 Hypertensive chronic kidney disease with stage 1 through stage 4 chronic kidney disease, or unspecified chronic kidney disease: Secondary | ICD-10-CM | POA: Diagnosis present

## 2011-05-29 LAB — BASIC METABOLIC PANEL
CO2: 23 mEq/L (ref 19–32)
Calcium: 8.9 mg/dL (ref 8.4–10.5)
Creatinine, Ser: 1.61 mg/dL — ABNORMAL HIGH (ref 0.50–1.35)
GFR calc Af Amer: 51 mL/min — ABNORMAL LOW (ref >60)
GFR calc non Af Amer: 43 mL/min — ABNORMAL LOW (ref >60)
Sodium: 141 mEq/L (ref 135–145)

## 2011-05-29 LAB — HEPARIN LEVEL (UNFRACTIONATED): Heparin Unfractionated: 0.19 IU/mL — ABNORMAL LOW (ref 0.30–0.70)

## 2011-05-29 LAB — CBC
HCT: 37.6 % — ABNORMAL LOW (ref 39.0–52.0)
MCH: 32.6 pg (ref 26.0–34.0)
MCV: 97.4 fL (ref 78.0–100.0)
Platelets: 124 10*3/uL — ABNORMAL LOW (ref 150–400)
RBC: 3.86 MIL/uL — ABNORMAL LOW (ref 4.22–5.81)

## 2011-05-29 LAB — LIPID PANEL
Cholesterol: 118 mg/dL (ref 0–200)
HDL: 55 mg/dL (ref >39)
Total CHOL/HDL Ratio: 2.1 RATIO
Triglycerides: 38 mg/dL (ref <150)
VLDL: 8 mg/dL (ref 0–40)

## 2011-05-29 LAB — CK TOTAL AND CKMB (NOT AT ARMC)
Relative Index: 2.2 (ref 0.0–2.5)
Total CK: 260 U/L — ABNORMAL HIGH (ref 7–232)

## 2011-05-29 LAB — TROPONIN I
Troponin I: <0.3 ng/mL (ref <0.30)
Troponin I: <0.3 ng/mL (ref <0.30)

## 2011-05-29 LAB — TSH: TSH: 2.518 u[IU]/mL (ref 0.350–4.500)

## 2011-05-29 LAB — CARDIAC PANEL(CRET KIN+CKTOT+MB+TROPI)
CK, MB: 5.3 ng/mL — ABNORMAL HIGH (ref 0.3–4.0)
Total CK: 205 U/L (ref 7–232)
Troponin I: <0.3 ng/mL (ref <0.30)

## 2011-05-29 LAB — HEPATIC FUNCTION PANEL
Albumin: 3.4 g/dL — ABNORMAL LOW (ref 3.5–5.2)
Alkaline Phosphatase: 111 U/L (ref 39–117)
Total Protein: 6.2 g/dL (ref 6.0–8.3)

## 2011-05-29 LAB — DIFFERENTIAL
Basophils Absolute: 0.1 10*3/uL (ref 0.0–0.1)
Basophils Relative: 1 % (ref 0–1)
Eosinophils Absolute: 0.4 10*3/uL (ref 0.0–0.7)
Eosinophils Relative: 9 % — ABNORMAL HIGH (ref 0–5)
Lymphocytes Relative: 16 % (ref 12–46)

## 2011-05-29 NOTE — Telephone Encounter (Signed)
Pt defib went off yest pm while at rest. Pt this am feels unwell. He is coming by car to the ER because EMS will not bring him to [facility] from [place]. Family understands to stop and call 911 for Sx.

## 2011-05-30 DIAGNOSIS — I472 Ventricular tachycardia: Secondary | ICD-10-CM

## 2011-05-30 LAB — BASIC METABOLIC PANEL
CO2: 26 mEq/L (ref 19–32)
Calcium: 8.5 mg/dL (ref 8.4–10.5)
Chloride: 104 mEq/L (ref 96–112)
Creatinine, Ser: 1.52 mg/dL — ABNORMAL HIGH (ref 0.50–1.35)
Glucose, Bld: 101 mg/dL — ABNORMAL HIGH (ref 70–99)
Sodium: 137 mEq/L (ref 135–145)

## 2011-05-30 LAB — CBC
MCH: 32.9 pg (ref 26.0–34.0)
MCHC: 34 g/dL (ref 30.0–36.0)
MCV: 96.7 fL (ref 78.0–100.0)
Platelets: 114 10*3/uL — ABNORMAL LOW (ref 150–400)
RBC: 3.62 MIL/uL — ABNORMAL LOW (ref 4.22–5.81)

## 2011-05-30 LAB — CARDIAC PANEL(CRET KIN+CKTOT+MB+TROPI)
Relative Index: 2.7 — ABNORMAL HIGH (ref 0.0–2.5)
Total CK: 166 U/L (ref 7–232)

## 2011-05-30 NOTE — Cardiovascular Report (Signed)
NAMEMarland Kitchen  Frank, MIRABAL NO.:  [id number]  MEDICAL RECORD NO.:  [id number]  LOCATION:  6529                         FACILITY:  [facility]  PHYSICIAN:  Verne Carrow, MDDATE OF BIRTH:  [birth year]  DATE OF PROCEDURE:  05/29/2011 DATE OF DISCHARGE:                           CARDIAC CATHETERIZATION   PRIMARY CARDIOLOGIST:  Peter M. Swaziland, MD  PRIMARY ELECTROPHYSIOLOGIST:  Duke Salvia, MD, [facility]  PROCEDURES PERFORMED: 1. Left heart catheterization. 2. Selective coronary angiography.  OPERATOR:  Verne Carrow, MD  ARTERIAL ACCESS SITE:  Right radial artery.  INDICATIONS:  This is 70 year old Caucasian male with a history of coronary artery disease with prior stenting of the mid circumflex artery with a drug-eluting stent in December of 2011, who has an ischemic cardiomyopathy and a defibrillator in place.  The patient had shortness of breath and chest discomfort over the last 24 hours and his defibrillator fired earlier today.  The patient was admitted by Dr. Peter Swaziland and a diagnostic catheterization was planned to exclude any progression of his coronary artery disease.  PROCEDURE IN DETAIL:  The patient was brought to the main cardiac catheterization laboratory after signing informed consent for the procedure.  An Freida Busman test was performed on the right wrist and it was positive.  The right wrist was prepped and draped in sterile fashion.  A 1% lidocaine was used for local anesthesia.  A 5-French sheath was inserted into the right radial artery without difficulty.  3 mg of verapamil was given through the arterial sheath.  At this point, standard diagnostic catheters were used to perform selective coronary angiography.  All catheter exchanges were performed over the wire.  A pigtail catheter was used to cross the aortic valve into the left ventricle.  No left ventricular angiogram was performed.  There were no immediate complications.  The  sheath was removed and a Terumo hemostasis band was applied over the arteriotomy site.  The patient was taken to the recovery area in stable condition.  HEMODYNAMIC FINDINGS:  Central aortic pressure 142/62, left ventricular pressure 140/16/21.  ANGIOGRAPHIC FINDINGS: 1. The left main coronary artery had mild plaque disease. 2. The left anterior descending was a large vessel that coursed to the     apex.  There was mild 30-40% plaque throughout the mid LAD, but no     focally obstructive lesions.  There were several moderate-sized     diagonal branches that had diffuse 40% stenosis, but no focally     obstructive lesions. 3. The circumflex artery gave off an early obtuse marginal branch that     was patent with mild plaque.  The mid circumflex artery had a stent     that was patent with no restenosis. 4. The right coronary artery is a large dominant vessel with 100%     total occlusion in the proximal to midportion of the vessel.  The     distal vessel fills both from right-to-right collaterals and left-     to-right collaterals.  This vessel was unchanged in appearance     since prior catheterization. 5. No left ventricular angiogram was performed.  IMPRESSION: 1. Double-vessel coronary artery disease. 2. Patent stent in the  mid circumflex artery. 3. 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  RECOMMENDATIONS:  I recommend continued medical management at this time.     Verne Carrow, MD     CM/MEDQ  D:  05/29/2011  T:  05/30/2011  Job:  [id number]  Electronically Signed by Verne Carrow MD on 05/30/2011 01:49:07 PM

## 2011-05-31 ENCOUNTER — Telehealth: Payer: Self-pay | Admitting: Internal Medicine

## 2011-05-31 NOTE — Telephone Encounter (Signed)
Pt aware - per D/C summary pt is to take 400 mg a day.  He states understanding.

## 2011-05-31 NOTE — Telephone Encounter (Signed)
Per pt call he was taking a different dose of amiodarone prior to his hospital visit and when he was discharged it was changed and he has questions.

## 2011-06-01 NOTE — Discharge Summary (Addendum)
NAMECLENT, DAMORE NO.:  [id number]  MEDICAL RECORD NO.:  [id number]  LOCATION:  6529                         FACILITY:  [facility]  PHYSICIAN:  Peter M. Swaziland, M.D.  DATE OF BIRTH:  1940-12-23  DATE OF ADMISSION:  05/29/2011 DATE OF DISCHARGE:                              DISCHARGE SUMMARY   DISCHARGE DIAGNOSES: 1. Recurrent ventricular tachycardia, status post implantable     cardioverter-defibrillator discharge prior to admission. 2. Chest pain with cardiac catheterization this admission on May 29, 2011, demonstrating double-vessel coronary artery disease with     patent stents in the mid circumflex artery and chronic total     occlusion of the mid right coronary artery, unchanged from prior,     with recommendations for continued medical management. 3. History of ventricular tachycardia.     a.     Status post implantable cardioverter-defibrillator      implantation with last change-out in April 2005 with Medtronic      Maximo device.     b.     Recurrent ventricular tachycardia requiring external      cardioversion in April 2012. 4. Ischemic cardiomyopathy with ejection fraction of 25-30% by     echocardiogram on May 29, 2011. 5. Coronary artery disease.     a.     Status post myocardial infarction in 1982.     b.     Status post percutaneous coronary intervention and drug-      eluting stent placement to the left circumflex system in 2011.     c.     Non-ST-segment elevation myocardial infarction in April      2012, felt secondary to ventricular tachycardia. 6. Valvular heart disease with mild aortic regurgitation and moderate     mitral regurgitation by echocardiogram on May 29, 2011. 7. Chronic renal insufficiency with base creatinine of 1.5-1.6, 1.52     at discharge. 8. Peripheral arterial disease.     a.     Status post right common femoral to popliteal in March 2011.     b.     Status post left carotid endarterectomy in 2008.     c.      Status post left common femoral bypass graft in July 2003.     d.     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     e.     Aorta to right renal artery bypass in June 2003 as well as      left renal artery bypass. 9. Hyperlipidemia. 10.Hypertension. 11.Previous transient ischemic attack. 12.Thrombocytopenia with discharge platelet count of 114.  HOSPITAL COURSE:  [name] is a 70 year old gentleman with a complex past medical history of coronary artery disease and ischemic cardiomyopathy as well as VT.  He was at rest having no symptoms yesterday when his defibrillator fired x1.  He went to his PCP and was evaluated, but did not have any lab work done.  He went home and was in his usual state of health up until very early in the morning of admission, May 20, 2011, a.m. when he had onset of left-sided upper chest pain with tingling hands.  At its worst, it reached to 7/10.  He presents to the ER for further evaluation.  He had associated shortness of breath, no nausea, but no vomiting or diaphoresis.  He received 81 mg of aspirin x4 as well as sublingual with pain down to a 3/10 by the time of evaluation with Cardiology.  EKG demonstrated sinus rhythm, rate 66 with no acute ST-T changes.  Upon interrogation of the ICD, it was noted that he had ICD discharge as well as 4 pace terminated episodes of VT. Given his chest pain, it was felt that the patient will need a repeat catheterization to rule out ischemia cause of ongoing chest pain and recurrent VT.  Timing of his VT correlated with a decreased amiodarone dose.  His magnesium and potassium were within normal limits.  He underwent cardiac catheterization on May 29, 2011, demonstrating patent stents in the mid circumflex artery as well as chronic total occlusion of  the mid RCA that is unchanged from prior.  He also has mild nonobstructive disease in the mid LAD.  Two-D echocardiogram was obtained with below findings.  The patient tolerated the procedure well. On the day of discharge, he is feeling better.  Dr. Swaziland has seen and examined him today and feels he is stable for discharge.  Prior to discharge, the patient's amiodarone dose was discussed with Dr. Johney Frame who feels that he should be discharged on 400 mg daily as well as beta- blocker.  If time allows, we will ask Dr. Graciela Husbands to come over and evaluate the patient for possible changes to this recommendation.  DISCHARGE LABORATORY DATA:  WBC 5.2, hemoglobin 11.9, hematocrit 35, and platelet count 114.  Sodium 137, potassium 4.1, chloride 104, CO2 of 26, glucose 101, BUN 18, and creatinine 1.52.  LFTs were normal on May 29, 2011, with the exception of decreased albumin 3.4, magnesium of 2.1. Cardiac enzymes were cycled which showed elevated CK peak of 260 with MB peak of 5.7, but negative troponins x3.  Total cholesterol 118, triglycerides 38, HDL 55, and LDL 65.  TSH 2.518.  STUDIES: 1. Cardiac catheterization on May 29, 2011, please see full report     for details. 2. Chest x-ray on May 29, 2011, showed cardiomegaly without acute     cardiopulmonary disease.  DISCHARGE MEDICATIONS: 1. Coreg 3.125 mg b.i.d. 2. Amiodarone 400 mg daily. 3. Aspirin 81 mg daily. 4. Altace 5 mg daily. 5. Calcium, magnesium, zinc OTC 3 tablets daily. 6. ICaps 1 tablet b.i.d. 7. Lotemax 0.5% 1 drop both eyes every morning. 8. Lasix 20 mg daily. 9. Lipitor 80 mg daily. 10.Multivitamin 1 tablet daily at bedtime. 11.Niaspan 500 mg 3 tablets daily nightly. 12.Nitroglycerin sublingual 0.4 mg every 5 minutes as needed up to 3     doses for chest pain. 13.Plavix 75 mg daily. 14.Synthroid 112 mcg daily.  DISPOSITION:  [name] will be discharged in stable condition to home.  Dr. Swaziland feels he may return  to work.  He is not lift anything over 5 pounds for 1 week.  He is not to drive for 6 weeks.  No participation in sexual activity for 1 week.  He is to follow a low- sodium, heart-healthy diet and to call or return if he notices any pain, swelling, bleeding,  or pus at the cath site.  He will follow up with Dr. Graciela Husbands as an outpatient and our office will him with this appointment.  We will also see if Dr. Graciela Husbands is able to see the patient prior to discharge.  If not, he is waiting a ride that will come at 4 o'clock.  DURATION OF DISCHARGE ENCOUNTER:  Greater than 30 minutes including physician and PA time.     Ronie Spies, P.A.C.   ______________________________ Peter M. Swaziland, M.D.    DD/MEDQ  D:  05/30/2011  T:  05/30/2011  Job:  [id number]  cc:   Duke Salvia, MD, [facility]  Electronically Signed by PETER Swaziland M.D. on 06/01/2011 08:51:02 AM Electronically Signed by Ronie Spies  on 06/01/2011 06:07:42 PM

## 2011-06-15 NOTE — H&P (Signed)
NAME:  [name], [name] NO.:  [id number]  MEDICAL RECORD NO.:  [id number]  LOCATION:  [facility]                         FACILITY:  [facility]  PHYSICIAN:  Peter M. Swaziland, M.D.  DATE OF BIRTH:  1941/07/10  DATE OF ADMISSION:  05/29/2011 DATE OF DISCHARGE:                             HISTORY & PHYSICAL   PRIMARY CARE PHYSICIAN:  Raynald Kemp, MD, Family Medicine.  PRIMARY CARDIOLOGIST:  Peter M. Swaziland, MD  ELECTROPHYSIOLOGIST:  Duke Salvia, MD, [facility]  CHIEF COMPLAINT:  Chest pain.  HISTORY OF PRESENT ILLNESS:  [name] is a 70 year old male with a history of coronary artery disease and VT.  He was at rest and having no symptoms yesterday afternoon when his defibrillator fired x1.  He went to his primary care physician and was evaluated, but did not have any lab work done yesterday.  He had thyroid studies done on May 23, 2011. He had last seen Dr. Swaziland on May 25, 2011.  He went home after that and was in his usual state of health last night.  At 6:15 a.m., he was up and already had breakfast when he had onset of left-sided upper chest pain.  Both hands were tingling.  The chest pain radiated from the left over to the right.  At its worst, it reached at 7/10.  His significant other brought him to the emergency room and en route, he took a sublingual nitroglycerin x1.  He had some improvement in his pain. Associated symptoms included shortness of breath and nausea, but no vomiting or diaphoresis.  In the emergency room, he received aspirin 81 mg x4 as well as sublingual nitroglycerin x1.  His chest pain is now down to a 3/10.  His symptoms began at rest and this is the first episode he has had since April 2012 when he was here with chest pain as well as VT and ruled in for non-ST-segment elevation MI, although no cath was performed because this was felt secondary to the VT.  Although he is still having some chest pain, he did not appear to be in  acute distress.  PAST MEDICAL HISTORY: 1. Recurrent VT requiring external cardioversion in April 2012, status     post ICD. 2. Ischemic cardiomyopathy with an EF of 35-40% by echo, December     2011. 3. Status post MI in 1982. 4. Status post percutaneous intervention with a drug-eluting stent to     the left circumflex in December 2011, medical therapy for subtotal     RCA recommended. 5. Mild MR and mild AR by echo, December 2011. 6. Chronic kidney disease, stage III. 7. History of non-STEMI in December 2011 and April 2012 felt secondary     to VT. 8. History of aortobifemoral bypass in June 2003 with bilateral renal     artery bypass. 9. Status post left fem-pop in July 2003. 10.Status post right fem-pop in 2011. 11.Left CEA in 2008. 12.Hypertension. 13.Hyperlipidemia. 14.History of TIA. 15.PAD.  ALLERGIES:  The patient had VT while on sotalol.  CURRENT MEDICATIONS: 1. Plavix 75 mg a day. 2. Lipitor 80 mg a day. 3. Lasix 20 mg a day. 4. Bufferin 325 mg  a day. 5. Niaspan 1500 mg a day. 6. Multivitamin daily.7. Sublingual nitroglycerin p.r.n. 8. Altace 5 mg a day. 9. Synthroid 112 mcg daily. 10.Amiodarone 400 mg alternating one pill and one half tablet. 11.ICaps b.i.d. 12.Lotemax eye drops q.a.m. 13.Calcium, magnesium, zinc t.i.d.  SOCIAL HISTORY:  He lives in [place] with a significant other.  He works part-time as a Electrical engineer at [employer].  He has greater than 50-pack-year history of tobacco use, but quit in 2003.  He does not abuse alcohol or drugs.  FAMILY HISTORY:  His mother died at 67 with a history of heart failure and his father died at 21 with a history of coronary artery disease, but no siblings have heart disease.  REVIEW OF SYSTEMS:  He has chronic arthralgias and multiple joint pains. Occasionally, he has reflux symptoms that are vomiting, but he does not get all of heartburn and does not take daily medication for this.  He had nausea  with the chest pain.  He walks a great deal of the time at work and does not have problems with exertional chest pain or dyspnea on exertion.  He has not had any problems with weight gain or edema.  He never gets palpitations.  Full 14-point review of systems is otherwise negative except as stated in the HPI.  PHYSICAL EXAMINATION:  VITAL SIGNS:  Temperature is 97.8, blood pressure 142/71, heart rate 65, respiratory rate 24, O2 saturation 97% on room air. GENERAL:  He is a well-developed, well-nourished white male in no acute distress. HEENT:  Normal. NECK:  There is no lymphadenopathy, thyromegaly, bruit, or JVD noted. CV:  His heart is regular in rate and rhythm with an S1 and S2.  No significant murmur, rub, or gallop is noted.  Distal pulses are intact in all four extremities. LUNGS:  Essentially clear to auscultation bilaterally. SKIN:  No rashes or lesions are noted. ABDOMEN:  Soft and nontender with active bowel sounds. EXTREMITIES:  There is no cyanosis or clubbing and trace edema only on the right. MUSCULOSKELETAL:  There is no joint deformity or effusions and no spine or CVA tenderness. NEUROLOGIC:  He is alert and oriented.  Cranial nerves II through XII grossly intact.  Chest x-ray shows cardiomegaly, but no acute disease.  EKG is sinus rhythm, rate 66 with no acute ischemic changes.  LABORATORY VALUES:  Hemoglobin 12.6, hematocrit 37.6, WBCs 5.1, platelets 124.  INR 1.2.  Sodium 141, potassium 4.1, chloride 109, CO2 23, BUN 23, creatinine 1.61, glucose 143.  Magnesium and TSH are pending.  Total bilirubin 0.2, albumin 3.4.  Other LFTs within normal limits.  CK-MB 260/5.7 with an index of 2.2 and a troponin of less than 0.3.  Total cholesterol 118, triglycerides 38, HDL 55, LDL 55, magnesium 2.1.  IMPRESSION:  [name] was seen today by Dr. Swaziland, the patient evaluated and the data reviewed.  He is a 70 year old male well known to Dr. Swaziland.  He has a history  of ventricular tachycardia, status post implantable cardioverter-defibrillator, on chronic amiodarone.  His amiodarone dose was decreased about 6 weeks ago from 400 mg daily to alternating 400 mg and 200 mg.  He had an implantable cardioverter- defibrillator discontinued yesterday for ventricular tachycardia.  On interrogation, he also had four paced - terminated episodes.  This a.m., he had chest pain that was not exertional.  It peaked at a 7/10 and is now 3/10.  He has known coronary artery disease with a chronic subtotaled right coronary artery  which has collaterals.  He had stent to the mid-circumflex in December 2011.  No ventricular tachycardia was noted this a.m.  We think [name] needs a repeat catheterization to rule out an ischemic cause of his ongoing chest pain and recurrent ventricular tachycardia.  The timing of the ventricular tachycardia correlates with decreased amiodarone dose.  We will check with Dr. Graciela Husbands about dosing of his amiodarone.  We will continue to follow his labs closely.     Theodore Demark, PA-C   ______________________________ Peter M. Swaziland, M.D.    RB/MEDQ  D:  05/29/2011  T:  05/29/2011  Job:  [id number]  Electronically Signed by Theodore Demark PA-C on 06/14/2011 02:05:07 PM Electronically Signed by PETER Swaziland M.D. on 06/15/2011 08:41:48 AM

## 2011-06-18 ENCOUNTER — Ambulatory Visit (INDEPENDENT_AMBULATORY_CARE_PROVIDER_SITE_OTHER): Payer: Medicare Other | Admitting: Physician Assistant

## 2011-06-18 ENCOUNTER — Ambulatory Visit (INDEPENDENT_AMBULATORY_CARE_PROVIDER_SITE_OTHER): Payer: Medicare Other | Admitting: *Deleted

## 2011-06-18 ENCOUNTER — Encounter: Payer: Self-pay | Admitting: Physician Assistant

## 2011-06-18 VITALS — BP 102/70 | HR 56 | Ht 73.0 in | Wt 209.0 lb

## 2011-06-18 DIAGNOSIS — I472 Ventricular tachycardia: Secondary | ICD-10-CM

## 2011-06-18 DIAGNOSIS — I255 Ischemic cardiomyopathy: Secondary | ICD-10-CM

## 2011-06-18 DIAGNOSIS — I2589 Other forms of chronic ischemic heart disease: Secondary | ICD-10-CM

## 2011-06-18 DIAGNOSIS — I2510 Atherosclerotic heart disease of native coronary artery without angina pectoris: Secondary | ICD-10-CM

## 2011-06-18 DIAGNOSIS — I10 Essential (primary) hypertension: Secondary | ICD-10-CM

## 2011-06-18 NOTE — Progress Notes (Signed)
ICD interrogation only 

## 2011-06-18 NOTE — Progress Notes (Signed)
History of Present Illness: Primary Cardiologist:  Dr. Peter Swaziland  Primary Electrophysiologist:  Dr. Sherryl Manges  [name] is a 70 y.o. male who presents for post hospital follow up.  He has a h/o CAD, s/p prior PCI, ICM with EF 25-30%, VTach, s/p Medtronic Maximo AICD, PAD, HTN, hyperlipidemia, CKD, and prior TIA.  He had an MI in 1982 and DES to CFX in 2011 and NSTEMI in 4/12 due to VTach.    He was admx 7/24-7/25.  He received one shock from his ICD.  He developed chest pain the next day and decided to come to the ED.  He was admitted and ruled out for MI.  His device was interrogated and he was shocked for VTach and had ATP for 4 other episodes.  His episode was c/w recent decrease in his amiodarone dose.  He had his amiodarone dose increased to 400 mg QD and he was started on beta blocker therapy.  Echo 7/24: EF 25-30%, mild AI, mod MR, mod LAE.  He underwent cardiac cath 7/25:  LM ok, LAD mid 30-40%, Dx's with 40%, mCFX stent patent, prox to mid RCA occluded with R to R and L to R collats.  Medical Tx was continued.    Labs, etc 7/12:  CXR non acute.  Hgb 11.9, Plt 114, K 4.1, Creat 1.52 (highest 1.61), LFTs ok, CE's neg x 3, TC 118, TG 38, HDL 55, LDL 55, TSH 2.518.  The patient denies chest pain, shortness of breath, syncope, orthopnea, PND or significant pedal edema.  Right wrist is ok.  Past Medical History  Diagnosis Date  . CAD (coronary artery disease)     s/p MI 1982;  s/p DES to CFX 2011;  s/p NSTEMI 4/12 in setting of VTach;  cath 7/12:  LM ok, LAD mid 30-40%, Dx's with 40%, mCFX stent patent, prox to mid RCA occluded with R to R and L to R collats.  Medical Tx was continued  . Bradycardia   . Ventricular tachycardia     s/p AICD;   h/o ICD shock;  Amiodarone Rx  . 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 HTN (hypertension)   . HLD  (hyperlipidemia)   . Cytopenia   . Hypothyroidism   . Renal insufficiency   . Claudication   . 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 Chronic systolic heart failure   . Ischemic cardiomyopathy     echo 7/12:  EF 25-30%, mild AI, mod MR, mod LAE  . TIA (transient ischemic attack)   . CVD (cerebrovascular disease)     left CEA 2008    Current Outpatient Prescriptions  Medication Sig Dispense Refill  . amiodarone (PACERONE) 400 MG tablet Take 400 mg by mouth daily.       [name] aspirin 325 MG tablet Take 81 mg by mouth daily.       [name] atorvastatin (LIPITOR) 80 MG tablet Take 1 tablet by mouth daily.      . Calcium-Magnesium-Zinc 500-250-12.5 MG TABS Take 2 tablets by mouth daily.        . carvedilol (COREG) 3.125 MG tablet Take 3.125 mg by mouth 2 (two) times daily with a meal.        . clopidogrel (PLAVIX) 75 MG tablet Take 75  mg by mouth daily.        . furosemide (LASIX) 20 MG tablet Take 20 mg by mouth daily.        [name] levothyroxine (SYNTHROID) 112 MCG tablet Take 1 tablet (112 mcg total) by mouth daily.      [name] loteprednol (LOTEMAX) 0.5 % ophthalmic suspension Place 1 drop into both eyes as directed.        . Multiple Vitamin (MULTIVITAMIN) tablet Take 1 tablet by mouth daily.       . Multiple Vitamins-Minerals (EYE VITAMINS PO) Take by mouth 2 (two) times daily.        [name] NIASPAN 500 MG CR tablet Take 3 tablets by mouth daily.      . nitroGLYCERIN (NITROSTAT) 0.4 MG SL tablet Place 0.4 mg under the tongue every 5 (five) minutes as needed.        . ramipril (ALTACE) 5 MG capsule Take 5 mg by mouth daily.          Allergies: Allergies  Allergen Reactions  . Meperidine Hcl     Vital Signs: BP 102/70  Pulse 56  Ht 6\' 1"  (1.854 m)  Wt 209 lb (94.802 kg)  BMI 27.57 kg/m2  PHYSICAL EXAM: Well nourished, well developed, in no acute distress HEENT: normal Neck: no JVD Cardiac:  normal S1, S2; RRR; no murmur Lungs:  clear to auscultation bilaterally, no wheezing, rhonchi or  rales Abd: soft, nontender, no hepatomegaly Ext: no edema; right radial site without hematoma or bruit Skin: warm and dry Neuro:  CNs 2-12 intact, no focal abnormalities noted  EKG:  Sinus brady, HR 56, normal axis, inf Qs, NSSTTW changes  ASSESSMENT AND PLAN:

## 2011-06-18 NOTE — Assessment & Plan Note (Signed)
Good medical regimen.  No signs of volume overload.

## 2011-06-18 NOTE — Assessment & Plan Note (Signed)
Stable anatomy at cath.  Continue ASA, Plavix, Lipitor.  Follow up with Dr. Swaziland as scheduled.

## 2011-06-18 NOTE — Assessment & Plan Note (Signed)
We interrogated his device today.  He has had 2 runs of NSVT of 5-6 beats since d/c from the hospital.  No sustained runs or ATP.  His BP will not tolerate increasing his coreg any.  Continue current meds.  Follow up with Dr. Graciela Husbands in 2-3 months.

## 2011-06-18 NOTE — Patient Instructions (Signed)
Your physician recommends that you schedule a follow-up appointment in: 3 months with Dr. Graciela Husbands as per Tereso Newcomer, PA-C

## 2011-06-18 NOTE — Assessment & Plan Note (Signed)
Controlled.  Continue current therapy.  

## 2011-06-25 ENCOUNTER — Encounter: Payer: Self-pay | Admitting: Vascular Surgery

## 2011-07-12 ENCOUNTER — Other Ambulatory Visit: Payer: Self-pay | Admitting: Internal Medicine

## 2011-07-12 ENCOUNTER — Encounter: Payer: Self-pay | Admitting: Internal Medicine

## 2011-07-12 ENCOUNTER — Ambulatory Visit (INDEPENDENT_AMBULATORY_CARE_PROVIDER_SITE_OTHER): Payer: Medicare Other | Admitting: *Deleted

## 2011-07-12 DIAGNOSIS — I2589 Other forms of chronic ischemic heart disease: Secondary | ICD-10-CM

## 2011-07-12 DIAGNOSIS — I255 Ischemic cardiomyopathy: Secondary | ICD-10-CM

## 2011-07-12 DIAGNOSIS — I472 Ventricular tachycardia, unspecified: Secondary | ICD-10-CM

## 2011-07-12 DIAGNOSIS — Z95810 Presence of automatic (implantable) cardiac defibrillator: Secondary | ICD-10-CM

## 2011-07-18 NOTE — Progress Notes (Signed)
Addended by: Judithe Modest D on: 07/18/2011 04:02 PM   Modules accepted: Orders

## 2011-07-23 ENCOUNTER — Encounter: Payer: Self-pay | Admitting: Vascular Surgery

## 2011-07-23 NOTE — Progress Notes (Signed)
icd remote check  

## 2011-07-24 ENCOUNTER — Ambulatory Visit (INDEPENDENT_AMBULATORY_CARE_PROVIDER_SITE_OTHER): Payer: Medicare Other | Admitting: Vascular Surgery

## 2011-07-24 ENCOUNTER — Encounter (INDEPENDENT_AMBULATORY_CARE_PROVIDER_SITE_OTHER): Payer: Medicare Other | Admitting: *Deleted

## 2011-07-24 ENCOUNTER — Encounter: Payer: Self-pay | Admitting: *Deleted

## 2011-07-24 DIAGNOSIS — Z48812 Encounter for surgical aftercare following surgery on the circulatory system: Secondary | ICD-10-CM

## 2011-07-24 DIAGNOSIS — I739 Peripheral vascular disease, unspecified: Secondary | ICD-10-CM

## 2011-07-24 NOTE — Assessment & Plan Note (Signed)
OFFICE VISIT  KENDRA, WOOLFORD DOB:  1941-05-02                                       07/24/2011 [id number]#:[id number]  The patient returns today for continued followup regarding his multiple vascular procedures.  He most recently had right femoral-popliteal bypass graft performed in March of 2011 with saphenous vein graft.  He has no claudication symptoms at the present time, able to ambulate around 3 miles.  He denies any chest pain, dyspnea on exertion but has had problems with his defibrillator/pacemaker.  He also denies any neurologic symptoms suggestive of hemispheric or nonhemispheric TIAs.  CHRONIC MEDICAL PROBLEMS: 1. Hypertension. 2. Hyperlipidemia. 3. Coronary artery disease. 4. History of pacemaker and defibrillator inserted by Dr. Graciela Husbands in     2003. 5. Peripheral vascular disease.  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  FAMILY HISTORY:  Strongly positive for coronary artery disease in both parents and stroke in his father.  REVIEW OF SYSTEMS:  Positive for TIA 10 years ago.  Has not recurred. Does have arthritis and joint pain and history of atrial fibrillation. All other systems negative.  PHYSICAL EXAM:  Vital signs:  Blood pressure 160/80, heart rate 80, respirations 14.  General:  He is a well-developed, well-nourished male in no apparent distress, alert and oriented x3.  HEENT:  Exam normal for age.  EOMs intact.  Lungs:  Clear to auscultation.  No rhonchi or wheezing.  Cardiovascular:  Regular rhythm.  No murmurs.  Abdomen: Soft, nontender with no masses.  He has 3+ femoral and popliteal pulses bilaterally palpable.  Both feet are well-perfused.  Neurological: Normal.  Musculoskeletal:  Free of major deformities.  Today I ordered lower extremity duplex scan of his right femoral- popliteal graft which continues to have an elevated velocity  at the distal anastomosis which is unchanged over the past 12 months or so, 340 cm/s.  His ABIs continue to be greater than 1 bilaterally.  He is asymptomatic.  He will return in 6 months for repeat duplex scan of the right femoral- popliteal graft to decide whether angiography should be performed to rule out a distal anastomotic vein graft stenosis.    [name], M.D. Electronically Signed  JDL/MEDQ  D:  07/24/2011  T:  07/24/2011  Job:  [id number]

## 2011-07-31 NOTE — Progress Notes (Signed)
Subjective:     Patient ID: [name], male   DOB: [birth year], 70 y.o.   MRN: [id number]  HPI Due to power outage, note was scanned into Epic System   Review of Systems     Objective:   Physical Exam     Assessment:         Plan:

## 2011-08-01 NOTE — Procedures (Unsigned)
BYPASS GRAFT EVALUATION  INDICATION:  Follow up right lower extremity graft.  HISTORY: Diabetes:  No. Cardiac:  Yes. Hypertension:  Yes. Smoking:  Previous. Previous Surgery:  Aortobifem graft; bilateral aorto-to-renal graft; left fem-pop graft performed 05/2002; left CEA performed in 2008; right fem-pop graft performed 01/13/2010.  SINGLE LEVEL ARTERIAL EXAM                              RIGHT              LEFT Brachial: Anterior tibial: Posterior tibial: Peroneal: Ankle/brachial index:  PREVIOUS ABI:  Date: 01/09/11  RIGHT:  1.09  LEFT:  1.04  LOWER EXTREMITY BYPASS GRAFT DUPLEX EXAM:  DUPLEX: 1. Patent right limb of aortobifem graft without evidence of stenosis     at the distal anastomosis. 2. Patent right fem-pop graft with mildly elevated velocities of the     proximal anastomosis and significantly elevated velocities of the     distal anastomosis, which appear stable.  Disease is observed at     both anastomosis sites of the fem-pop graft. 3. See diagram for velocities.  IMPRESSION:  Patent right femoropopliteal graft with elevated velocities of the distal anastomosis.  ___________________________________________ [name], M.D.  LT/MEDQ  D:  07/24/2011  T:  07/24/2011  Job:  [id number]

## 2011-08-20 LAB — CBC
HCT: 36.6 — ABNORMAL LOW
HCT: 44.1
Hemoglobin: 12.4 — ABNORMAL LOW
MCHC: 34.3
MCV: 94.4
MCV: 95.4
Platelets: 99 — ABNORMAL LOW
RBC: 2.6 — ABNORMAL LOW
RBC: 3.84 — ABNORMAL LOW
RBC: 4.67
WBC: 6
WBC: 7.6
WBC: 8.1

## 2011-08-20 LAB — COMPREHENSIVE METABOLIC PANEL
BUN: 21
CO2: 29
Calcium: 9.7
Chloride: 101
Creatinine, Ser: 1.6 — ABNORMAL HIGH
GFR calc non Af Amer: 43 — ABNORMAL LOW
Total Bilirubin: 0.8

## 2011-08-20 LAB — BASIC METABOLIC PANEL
BUN: 12
CO2: 25
Chloride: 107
Creatinine, Ser: 0.9
GFR calc Af Amer: 59 — ABNORMAL LOW
GFR calc Af Amer: >60
GFR calc non Af Amer: >60
Potassium: 2.4 — CL
Potassium: 4.3

## 2011-08-20 LAB — URINALYSIS, ROUTINE W REFLEX MICROSCOPIC
Glucose, UA: NEGATIVE
Hgb urine dipstick: NEGATIVE
Protein, ur: NEGATIVE
Specific Gravity, Urine: 1.017
Urobilinogen, UA: 0.2

## 2011-08-20 LAB — CROSSMATCH

## 2011-08-20 LAB — APTT: aPTT: 29

## 2011-08-20 LAB — ABO/RH: ABO/RH(D): O POS

## 2011-08-21 ENCOUNTER — Encounter: Payer: Self-pay | Admitting: Internal Medicine

## 2011-08-21 ENCOUNTER — Ambulatory Visit (INDEPENDENT_AMBULATORY_CARE_PROVIDER_SITE_OTHER): Payer: Medicare Other | Admitting: Internal Medicine

## 2011-08-21 DIAGNOSIS — Z95810 Presence of automatic (implantable) cardiac defibrillator: Secondary | ICD-10-CM

## 2011-08-21 DIAGNOSIS — I255 Ischemic cardiomyopathy: Secondary | ICD-10-CM

## 2011-08-21 DIAGNOSIS — I2589 Other forms of chronic ischemic heart disease: Secondary | ICD-10-CM

## 2011-08-21 DIAGNOSIS — I472 Ventricular tachycardia: Secondary | ICD-10-CM

## 2011-08-21 NOTE — Assessment & Plan Note (Signed)
The patient has had recurrent ventricular tachycardia. They're 2 separate morphologies identified by his ICD. They were both about 450 ms. They have been difficult to terminate with antitachycardia pacing. On one occasion he was shocked because of failure of ATP x6.  His currently on amiodarone. I think it is time to consider catheter ablation. I will review this with colleagues.

## 2011-08-21 NOTE — Patient Instructions (Signed)
Dr. Graciela Husbands recommends that you see his partner, Dr. Lewayne Bunting to discuss possible Ventricular Tachycardia Ablation.  Your physician recommends that you continue on your current medications as directed. Please refer to the Current Medication list given to you today.

## 2011-08-21 NOTE — Assessment & Plan Note (Signed)
The patient's device was interrogated and the information was fully reviewed.  The device was reprogrammed to have more aggressive ATP.

## 2011-08-21 NOTE — Progress Notes (Signed)
HPI  [name] is a 70 y.o. male Seen in followup for ventricular tachycardia this is ischemic heart disease with prior revascularization.prior MI  This occurred concurrent with a decrease in his amiodarone dose.  He was admitted to hospital in July 2012 with recurrent VT resulting in ICD discharge. Catheterization at that time demonstratedDouble-vessel coronary artery disease with Patent stent in the mid circumflex artery and Chronic total occlusion of the mid right coronary artery that is  Unchanged. Ejection fraction of 25-30% with moderate MR Scan--large inferior wall infarct with severely depressed LV function and ejection fraction of 24%  His amiodarone was increased and he was started on a beta blocker at that time  He has had recurrent shock while playing golf. There were no antecedent symptoms.; his functional status has remained stable   Labs, etc 7/12: CXR non acute. Hgb 11.9, Plt 114, K 4.1, Creat 1.52 (highest 1.61), LFTs ok, CE's neg x 3, TC 118, TG 38, HDL 55, LDL 55, TSH 2.518.     Past Medical History  Diagnosis Date  . CAD (coronary artery disease)     s/p MI 1982;  s/p DES to CFX 2011;  s/p NSTEMI 4/12 in setting of VTach;  cath 7/12:  LM ok, LAD mid 30-40%, Dx's with 40%, mCFX stent patent, prox to mid RCA occluded with R to R and L to R collats.  Medical Tx was continued  . Bradycardia   . Ventricular tachycardia     s/p AICD;   h/o ICD shock;  Amiodarone Rx  . 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 HTN (hypertension)   . HLD (hyperlipidemia)   . Cytopenia   . Hypothyroidism   . Renal insufficiency   . Claudication   . 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 Chronic systolic heart failure   . Ischemic cardiomyopathy     echo 7/12:  EF 25-30%, mild AI, mod MR, mod LAE  . TIA (transient ischemic attack)   . CVD  (cerebrovascular disease)     left CEA 2008  . Arthritis   . Stroke   . Anemia     Past Surgical History  Procedure Date  . Femoral-popliteal bypass graft 2011  . Cardiac defibrillator placement     Medtronic  . Revascularization / in-situ graft leg   . Renal artery bypass   . Back surgery   . Lumbar laminectomy   . Knee surgery     bilateral  . Elbow surgery     right  . Cholecystectomy   . Lipoma excision     scalp  . Bone spur     heel  . Cataract extraction     bilateral  . Carotid stent     left  . Carotid endarterectomy     Current Outpatient Prescriptions  Medication Sig Dispense Refill  . amiodarone (PACERONE) 400 MG tablet Take 400 mg by mouth daily.       [name] aspirin 81 MG tablet Take 81 mg by mouth daily.        [name] atorvastatin (LIPITOR) 80 MG tablet Take 1 tablet by mouth daily.      . Calcium-Magnesium-Zinc 500-250-12.5 MG TABS Take 2 tablets by mouth daily.        . carvedilol (COREG)  3.125 MG tablet Take 3.125 mg by mouth 2 (two) times daily with a meal.        . clopidogrel (PLAVIX) 75 MG tablet Take 75 mg by mouth daily.        . furosemide (LASIX) 20 MG tablet Take 20 mg by mouth daily.        [name] levothyroxine (SYNTHROID) 112 MCG tablet Take 1 tablet (112 mcg total) by mouth daily.      [name] loteprednol (LOTEMAX) 0.5 % ophthalmic suspension Place 1 drop into both eyes as directed.        . Multiple Vitamin (MULTIVITAMIN) tablet Take 1 tablet by mouth daily.       . Multiple Vitamins-Minerals (EYE VITAMINS PO) Take by mouth 2 (two) times daily.        [name] NIASPAN 500 MG CR tablet Take 3 tablets by mouth daily.      . nitroGLYCERIN (NITROSTAT) 0.4 MG SL tablet Place 0.4 mg under the tongue every 5 (five) minutes as needed.        . ramipril (ALTACE) 5 MG capsule Take 5 mg by mouth daily.          Allergies  Allergen Reactions  . Meperidine Hcl     Review of Systems negative except from HPI and PMH  Physical Exam Well developed and well nourished in no  acute distress HENT normal E scleral and icterus clear Neck Supple JVP flat; carotids brisk and full Clear to ausculation Regular rate and rhythm, no murmurs gallops or rub Soft with active bowel sounds No clubbing cyanosis and edema Alert and oriented, grossly normal motor and sensory function Skin Warm and Dry   Assessment and  Plan

## 2011-08-21 NOTE — Assessment & Plan Note (Signed)
Please stable. He underwent catheterization 2 months ago. There is no progression of his coronary disease.

## 2011-08-29 ENCOUNTER — Ambulatory Visit: Payer: Medicare Other | Admitting: Internal Medicine

## 2011-09-06 ENCOUNTER — Encounter: Payer: Self-pay | Admitting: Internal Medicine

## 2011-09-06 ENCOUNTER — Ambulatory Visit (INDEPENDENT_AMBULATORY_CARE_PROVIDER_SITE_OTHER): Payer: Medicare Other | Admitting: Internal Medicine

## 2011-09-06 ENCOUNTER — Encounter: Payer: Self-pay | Admitting: *Deleted

## 2011-09-06 VITALS — BP 114/62 | HR 76 | Ht 72.0 in | Wt 204.0 lb

## 2011-09-06 DIAGNOSIS — I472 Ventricular tachycardia, unspecified: Secondary | ICD-10-CM

## 2011-09-06 LAB — CBC WITH DIFFERENTIAL/PLATELET
Basophils Absolute: 0 10*3/uL (ref 0.0–0.1)
Eosinophils Absolute: 0.3 10*3/uL (ref 0.0–0.7)
Hemoglobin: 14.3 g/dL (ref 13.0–17.0)
Lymphocytes Relative: 17.5 % (ref 12.0–46.0)
MCHC: 33.8 g/dL (ref 30.0–36.0)
Neutro Abs: 5.8 10*3/uL (ref 1.4–7.7)
Platelets: 197 10*3/uL (ref 150.0–400.0)
RDW: 15.7 % — ABNORMAL HIGH (ref 11.5–14.6)

## 2011-09-06 LAB — BASIC METABOLIC PANEL
BUN: 29 mg/dL — ABNORMAL HIGH (ref 6–23)
CO2: 26 mEq/L (ref 19–32)
Calcium: 8.8 mg/dL (ref 8.4–10.5)
Creatinine, Ser: 2.1 mg/dL — ABNORMAL HIGH (ref 0.4–1.5)
Glucose, Bld: 113 mg/dL — ABNORMAL HIGH (ref 70–99)

## 2011-09-06 NOTE — Patient Instructions (Signed)
Your physician has recommended that you have an ablation. Catheter ablation is a medical procedure used to treat some cardiac arrhythmias (irregular heartbeats). During catheter ablation, a long, thin, flexible tube is put into a blood vessel in your groin (upper thigh), or neck. This tube is called an ablation catheter. It is then guided to your heart through the blood vessel. Radio frequency waves destroy small areas of heart tissue where abnormal heartbeats may cause an arrhythmia to start. Please see the instruction sheet given to you today.   

## 2011-09-06 NOTE — Progress Notes (Signed)
HPI [name] is referred today by Dr.Klein consideration of catheter ablation of ventricular tachycardia. The patient is a very pleasant 69-year-old man with recurrent medical refractory ventricular tachycardia. He is been on amiodarone for many years. In trying to reduce his dose, the patient has had recurrent VT. His EKG during tachycardia demonstrates a right bundle branch block ventricular tachycardia at 145 beats per minute. This is an inferior axis VT with positive QRS morphologies in lead aVR and aVL. Lead one appears to be isoelectric. The transition from positive to negative occurs in V5. The patient has not had syncope with this. He in fact has not had a lot of symptoms in VT however when he gets shocked he will often feel like he is about to pass out just before he receives a shock. Allergies  Allergen Reactions  . Meperidine Hcl      Current Outpatient Prescriptions  Medication Sig Dispense Refill  . amiodarone (PACERONE) 400 MG tablet Take 400 mg by mouth daily.       . aspirin 81 MG tablet Take 81 mg by mouth daily.        . atorvastatin (LIPITOR) 80 MG tablet Take 1 tablet by mouth daily.      . Calcium-Magnesium-Zinc 500-250-12.5 MG TABS Take 2 tablets by mouth daily.        . carvedilol (COREG) 3.125 MG tablet Take 3.125 mg by mouth 2 (two) times daily with a meal.        . clopidogrel (PLAVIX) 75 MG tablet Take 75 mg by mouth daily.        . furosemide (LASIX) 20 MG tablet Take 20 mg by mouth daily.        . levothyroxine (SYNTHROID) 112 MCG tablet Take 1 tablet (112 mcg total) by mouth daily.      . loteprednol (LOTEMAX) 0.5 % ophthalmic suspension Place 1 drop into both eyes as directed.        . meclizine (ANTIVERT) 25 MG tablet       . Multiple Vitamin (MULTIVITAMIN) tablet Take 1 tablet by mouth daily.       . Multiple Vitamins-Minerals (EYE VITAMINS PO) Take by mouth 2 (two) times daily.        . NIASPAN 500 MG CR tablet Take 3 tablets by mouth daily.      .  nitroGLYCERIN (NITROSTAT) 0.4 MG SL tablet Place 0.4 mg under the tongue every 5 (five) minutes as needed.        . ramipril (ALTACE) 5 MG capsule Take 5 mg by mouth daily.           Past Medical History  Diagnosis Date  . CAD (coronary artery disease)     s/p MI 1982;  s/p DES to CFX 2011;  s/p NSTEMI 4/12 in setting of VTach;  cath 7/12:  LM ok, LAD mid 30-40%, Dx's with 40%, mCFX stent patent, prox to mid RCA occluded with R to R and L to R collats.  Medical Tx was continued  . Bradycardia   . Ventricular tachycardia     s/p AICD;   h/o ICD shock;  Amiodarone Rx  . 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 HTN (hypertension)   . HLD (hyperlipidemia)   . Cytopenia   . Hypothyroidism   .   Renal insufficiency   . Claudication   . 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 Chronic systolic heart failure   . Ischemic cardiomyopathy     echo 7/12:  EF 25-30%, mild AI, mod MR, mod LAE  . TIA (transient ischemic attack)   . CVD (cerebrovascular disease)     left CEA 2008  . Arthritis   . Stroke   . Anemia     ROS:   All systems reviewed and negative except as noted in the HPI.   Past Surgical History  Procedure Date  . Femoral-popliteal bypass graft 2011  . Cardiac defibrillator placement     Medtronic  . Revascularization / in-situ graft leg   . Renal artery bypass   . Back surgery   . Lumbar laminectomy   . Knee surgery     bilateral  . Elbow surgery     right  . Cholecystectomy   . Lipoma excision     scalp  . Bone spur     heel  . Cataract extraction     bilateral  . Carotid stent     left  . Carotid endarterectomy   . Doppler echocardiography 2011     Family History  Problem Relation Age of Onset  . Coronary artery disease    . Heart attack Mother   . Coronary artery disease Mother   . Coronary artery disease Father   .  Stroke Father      History   Social History  . Marital Status: Divorced    Spouse Name: N/A    Number of Children: 2  . Years of Education: N/A   Occupational History  . retired   . RETIRED    Social History Main Topics  . Smoking status: Former Smoker    Types: Cigarettes    Quit date: 11/05/2001  . Smokeless tobacco: Not on file  . Alcohol Use: Not on file  . Drug Use: Not on file  . Sexually Active: Not on file   Other Topics Concern  . Not on file   Social History Narrative  . No narrative on file     BP 114/62  Pulse 76  Ht 6' (1.829 m)  Wt 204 lb (92.534 kg)  BMI 27.67 kg/m2  Physical Exam:  Well appearing NAD HEENT: Unremarkable Neck:  No JVD, no thyromegally Lymphatics:  No adenopathy Back:  No CVA tenderness Lungs:  Clear no wheezes, rales, rhonchi. Well-healed ICD incision. HEART:  Regular rate rhythm, no murmurs, no rubs, no clicks Abd:  soft, positive bowel sounds, no organomegally, no rebound, no guarding Ext:  2 plus pulses, no edema, no cyanosis, no clubbing Skin:  No rashes no nodules Neuro:  CN II through XII intact, motor grossly intact  EKG Right bundle-branch block wide QRS tachycardia at 147 beats per minute  Assess/Plan:   

## 2011-09-06 NOTE — Assessment & Plan Note (Signed)
I discussed treatment options with the patient. The risks, goals, benefits, and expectations of catheter ablation of ventricular tachycardia have been discussed with the patient and his wife and they wish to proceed. The risks include bleeding, blood clot, bleeding around the heart, and his stroke but are certainly not limited to this.

## 2011-09-10 ENCOUNTER — Ambulatory Visit (HOSPITAL_COMMUNITY)
Admission: RE | Admit: 2011-09-10 | Discharge: 2011-09-10 | Disposition: A | Discharge status: Home / Self Care | Payer: Medicare Other | Source: Ambulatory Visit | Attending: Internal Medicine | Admitting: Internal Medicine

## 2011-09-10 ENCOUNTER — Encounter (HOSPITAL_COMMUNITY): Payer: Self-pay | Admitting: Pharmacy Technician

## 2011-09-10 MED ORDER — SODIUM CHLORIDE 0.45 % IV SOLN
INTRAVENOUS | Status: DC
  Administered 2011-09-10: 07:00:00 via INTRAVENOUS

## 2011-09-12 ENCOUNTER — Encounter (HOSPITAL_COMMUNITY)
Admission: RE | Disposition: A | Discharge status: Home / Self Care | Payer: Self-pay | Source: Ambulatory Visit | Attending: Internal Medicine

## 2011-09-12 ENCOUNTER — Observation Stay (HOSPITAL_COMMUNITY)
Admission: RE | Admit: 2011-09-12 | Discharge: 2011-09-13 | DRG: 251 | Disposition: A | Discharge status: Home / Self Care | Payer: Medicare Other | Source: Ambulatory Visit | Attending: Internal Medicine | Admitting: Internal Medicine

## 2011-09-12 ENCOUNTER — Encounter (HOSPITAL_COMMUNITY): Payer: Self-pay

## 2011-09-12 ENCOUNTER — Encounter (HOSPITAL_COMMUNITY): Payer: Self-pay | Admitting: *Deleted

## 2011-09-12 DIAGNOSIS — I252 Old myocardial infarction: Secondary | ICD-10-CM | POA: Insufficient documentation

## 2011-09-12 DIAGNOSIS — I472 Ventricular tachycardia, unspecified: Principal | ICD-10-CM | POA: Insufficient documentation

## 2011-09-12 DIAGNOSIS — Z95810 Presence of automatic (implantable) cardiac defibrillator: Secondary | ICD-10-CM

## 2011-09-12 DIAGNOSIS — I509 Heart failure, unspecified: Secondary | ICD-10-CM

## 2011-09-12 DIAGNOSIS — I2589 Other forms of chronic ischemic heart disease: Secondary | ICD-10-CM | POA: Insufficient documentation

## 2011-09-12 DIAGNOSIS — I10 Essential (primary) hypertension: Secondary | ICD-10-CM

## 2011-09-12 DIAGNOSIS — I2510 Atherosclerotic heart disease of native coronary artery without angina pectoris: Secondary | ICD-10-CM | POA: Insufficient documentation

## 2011-09-12 DIAGNOSIS — I255 Ischemic cardiomyopathy: Secondary | ICD-10-CM

## 2011-09-12 DIAGNOSIS — N259 Disorder resulting from impaired renal tubular function, unspecified: Secondary | ICD-10-CM

## 2011-09-12 DIAGNOSIS — E039 Hypothyroidism, unspecified: Secondary | ICD-10-CM | POA: Insufficient documentation

## 2011-09-12 DIAGNOSIS — Z8673 Personal history of transient ischemic attack (TIA), and cerebral infarction without residual deficits: Secondary | ICD-10-CM | POA: Insufficient documentation

## 2011-09-12 DIAGNOSIS — I739 Peripheral vascular disease, unspecified: Secondary | ICD-10-CM

## 2011-09-12 DIAGNOSIS — I4729 Other ventricular tachycardia: Principal | ICD-10-CM | POA: Insufficient documentation

## 2011-09-12 DIAGNOSIS — E785 Hyperlipidemia, unspecified: Secondary | ICD-10-CM

## 2011-09-12 DIAGNOSIS — I5022 Chronic systolic (congestive) heart failure: Secondary | ICD-10-CM | POA: Insufficient documentation

## 2011-09-12 HISTORY — PX: V-TACH ABLATION: SHX5498

## 2011-09-12 LAB — SURGICAL PCR SCREEN: Staphylococcus aureus: NEGATIVE

## 2011-09-12 LAB — POCT ACTIVATED CLOTTING TIME
Activated Clotting Time: 193 seconds
Activated Clotting Time: 171 seconds

## 2011-09-12 SURGERY — V-TACH ABLATION
Anesthesia: LOCAL

## 2011-09-12 MED ORDER — SODIUM CHLORIDE 0.9 % IV SOLN: INTRAVENOUS | Status: DC

## 2011-09-12 MED ORDER — ASPIRIN 81 MG PO TABS: 81.0000 mg | ORAL_TABLET | Freq: Every day | ORAL | Status: DC

## 2011-09-12 MED ORDER — AMIODARONE HCL 200 MG PO TABS
400.0000 mg | ORAL_TABLET | Freq: Every day | ORAL | Status: DC
  Administered 2011-09-12 – 2011-09-13 (×2): 400 mg via ORAL
  Filled 2011-09-12 (×2): qty 2

## 2011-09-12 MED ORDER — CLOPIDOGREL BISULFATE 75 MG PO TABS
75.0000 mg | ORAL_TABLET | Freq: Every day | ORAL | Status: DC
  Administered 2011-09-12 – 2011-09-13 (×2): 75 mg via ORAL
  Filled 2011-09-12 (×2): qty 1

## 2011-09-12 MED ORDER — FUROSEMIDE 20 MG PO TABS
20.0000 mg | ORAL_TABLET | Freq: Every day | ORAL | Status: DC
  Administered 2011-09-12 – 2011-09-13 (×2): 20 mg via ORAL
  Filled 2011-09-12 (×2): qty 1

## 2011-09-12 MED ORDER — LOTEPREDNOL ETABONATE 0.5 % OP SUSP: 1.0000 [drp] | OPHTHALMIC | Status: DC

## 2011-09-12 MED ORDER — SODIUM CHLORIDE 0.9 % IV SOLN
250.0000 mL | INTRAVENOUS | Status: DC
  Administered 2011-09-12: 250 mL via INTRAVENOUS

## 2011-09-12 MED ORDER — AMIODARONE HCL 200 MG PO TABS: 400.0000 mg | ORAL_TABLET | Freq: Every day | ORAL | Status: DC

## 2011-09-12 MED ORDER — ACETAMINOPHEN 325 MG PO TABS
650.0000 mg | ORAL_TABLET | ORAL | Status: DC | PRN
  Filled 2011-09-12: qty 2

## 2011-09-12 MED ORDER — LEVOTHYROXINE SODIUM 112 MCG PO TABS
112.0000 ug | ORAL_TABLET | Freq: Every day | ORAL | Status: DC
  Administered 2011-09-12 – 2011-09-13 (×2): 112 ug via ORAL
  Filled 2011-09-12 (×2): qty 1

## 2011-09-12 MED ORDER — SODIUM CHLORIDE 0.45 % IV SOLN
INTRAVENOUS | Status: DC
  Administered 2011-09-12: 07:00:00 via INTRAVENOUS

## 2011-09-12 MED ORDER — MECLIZINE HCL 25 MG PO TABS: 25.0000 mg | ORAL_TABLET | Freq: Three times a day (TID) | ORAL | Status: DC | PRN

## 2011-09-12 MED ORDER — CARVEDILOL 3.125 MG PO TABS
3.1250 mg | ORAL_TABLET | Freq: Two times a day (BID) | ORAL | Status: DC
  Administered 2011-09-12 – 2011-09-13 (×2): 3.125 mg via ORAL
  Filled 2011-09-12 (×4): qty 1

## 2011-09-12 MED ORDER — SODIUM CHLORIDE 0.9 % IJ SOLN: 3.0000 mL | Freq: Two times a day (BID) | INTRAMUSCULAR | Status: DC

## 2011-09-12 MED ORDER — SODIUM CHLORIDE 0.9 % IJ SOLN: 3.0000 mL | INTRAMUSCULAR | Status: DC | PRN

## 2011-09-12 MED ORDER — ASPIRIN 81 MG PO CHEW
81.0000 mg | CHEWABLE_TABLET | Freq: Every day | ORAL | Status: DC
  Administered 2011-09-12 – 2011-09-13 (×2): 81 mg via ORAL
  Filled 2011-09-12 (×2): qty 1

## 2011-09-12 MED ORDER — RAMIPRIL 5 MG PO CAPS
5.0000 mg | ORAL_CAPSULE | Freq: Every day | ORAL | Status: DC
  Administered 2011-09-12 – 2011-09-13 (×2): 5 mg via ORAL
  Filled 2011-09-12 (×2): qty 1

## 2011-09-12 MED ORDER — NIACIN ER (ANTIHYPERLIPIDEMIC) 500 MG PO TBCR
1500.0000 mg | EXTENDED_RELEASE_TABLET | Freq: Every day | ORAL | Status: DC
  Administered 2011-09-12 – 2011-09-13 (×2): 1500 mg via ORAL
  Filled 2011-09-12 (×2): qty 3

## 2011-09-12 MED ORDER — ONDANSETRON HCL 4 MG/2ML IJ SOLN: 4.0000 mg | Freq: Four times a day (QID) | INTRAMUSCULAR | Status: DC | PRN

## 2011-09-12 NOTE — H&P (View-Only) (Signed)
HPI [name] is referred today by Dr.Klein consideration of catheter ablation of ventricular tachycardia. The patient is a very pleasant 70 year old man with recurrent medical refractory ventricular tachycardia. He is been on amiodarone for many years. In trying to reduce his dose, the patient has had recurrent VT. His EKG during tachycardia demonstrates a right bundle branch block ventricular tachycardia at 145 beats per minute. This is an inferior axis VT with positive QRS morphologies in lead aVR and aVL. Lead one appears to be isoelectric. The transition from positive to negative occurs in V5. The patient has not had syncope with this. He in fact has not had a lot of symptoms in VT however when he gets shocked he will often feel like he is about to pass out just before he receives a shock. Allergies  Allergen Reactions  . Meperidine Hcl      Current Outpatient Prescriptions  Medication Sig Dispense Refill  . amiodarone (PACERONE) 400 MG tablet Take 400 mg by mouth daily.       [name] aspirin 81 MG tablet Take 81 mg by mouth daily.        [name] atorvastatin (LIPITOR) 80 MG tablet Take 1 tablet by mouth daily.      . Calcium-Magnesium-Zinc 500-250-12.5 MG TABS Take 2 tablets by mouth daily.        . carvedilol (COREG) 3.125 MG tablet Take 3.125 mg by mouth 2 (two) times daily with a meal.        . clopidogrel (PLAVIX) 75 MG tablet Take 75 mg by mouth daily.        . furosemide (LASIX) 20 MG tablet Take 20 mg by mouth daily.        [name] levothyroxine (SYNTHROID) 112 MCG tablet Take 1 tablet (112 mcg total) by mouth daily.      [name] loteprednol (LOTEMAX) 0.5 % ophthalmic suspension Place 1 drop into both eyes as directed.        . meclizine (ANTIVERT) 25 MG tablet       . Multiple Vitamin (MULTIVITAMIN) tablet Take 1 tablet by mouth daily.       . Multiple Vitamins-Minerals (EYE VITAMINS PO) Take by mouth 2 (two) times daily.        [name] NIASPAN 500 MG CR tablet Take 3 tablets by mouth daily.      .  nitroGLYCERIN (NITROSTAT) 0.4 MG SL tablet Place 0.4 mg under the tongue every 5 (five) minutes as needed.        . ramipril (ALTACE) 5 MG capsule Take 5 mg by mouth daily.           Past Medical History  Diagnosis Date  . CAD (coronary artery disease)     s/p MI 1982;  s/p DES to CFX 2011;  s/p NSTEMI 4/12 in setting of VTach;  cath 7/12:  LM ok, LAD mid 30-40%, Dx's with 40%, mCFX stent patent, prox to mid RCA occluded with R to R and L to R collats.  Medical Tx was continued  . Bradycardia   . Ventricular tachycardia     s/p AICD;   h/o ICD shock;  Amiodarone Rx  . 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 HTN (hypertension)   . HLD (hyperlipidemia)   . Cytopenia   . Hypothyroidism   .  Renal insufficiency   . Claudication   . 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 Chronic systolic heart failure   . Ischemic cardiomyopathy     echo 7/12:  EF 25-30%, mild AI, mod MR, mod LAE  . TIA (transient ischemic attack)   . CVD (cerebrovascular disease)     left CEA 2008  . Arthritis   . Stroke   . Anemia     ROS:   All systems reviewed and negative except as noted in the HPI.   Past Surgical History  Procedure Date  . Femoral-popliteal bypass graft 2011  . Cardiac defibrillator placement     Medtronic  . Revascularization / in-situ graft leg   . Renal artery bypass   . Back surgery   . Lumbar laminectomy   . Knee surgery     bilateral  . Elbow surgery     right  . Cholecystectomy   . Lipoma excision     scalp  . Bone spur     heel  . Cataract extraction     bilateral  . Carotid stent     left  . Carotid endarterectomy   . Doppler echocardiography 2011     Family History  Problem Relation Age of Onset  . Coronary artery disease    . Heart attack Mother   . Coronary artery disease Mother   . Coronary artery disease Father   .  Stroke Father      History   Social History  . Marital Status: Divorced    Spouse Name: N/A    Number of Children: 2  . Years of Education: N/A   Occupational History  . retired   . RETIRED    Social History Main Topics  . Smoking status: Former Smoker    Types: Cigarettes    Quit date: 11/05/2001  . Smokeless tobacco: Not on file  . Alcohol Use: Not on file  . Drug Use: Not on file  . Sexually Active: Not on file   Other Topics Concern  . Not on file   Social History Narrative  . No narrative on file     BP 114/62  Pulse 76  Ht 6' (1.829 m)  Wt 204 lb (92.534 kg)  BMI 27.67 kg/m2  Physical Exam:  Well appearing NAD HEENT: Unremarkable Neck:  No JVD, no thyromegally Lymphatics:  No adenopathy Back:  No CVA tenderness Lungs:  Clear no wheezes, rales, rhonchi. Well-healed ICD incision. HEART:  Regular rate rhythm, no murmurs, no rubs, no clicks Abd:  soft, positive bowel sounds, no organomegally, no rebound, no guarding Ext:  2 plus pulses, no edema, no cyanosis, no clubbing Skin:  No rashes no nodules Neuro:  CN II through XII intact, motor grossly intact  EKG Right bundle-branch block wide QRS tachycardia at 147 beats per minute  Assess/Plan:

## 2011-09-12 NOTE — Interval H&P Note (Signed)
History and Physical Interval Note:   09/12/2011   8:40 AM   [name]  has presented today for surgery, with the diagnosis of vtach  The various methods of treatment have been discussed with the patient and family. After consideration of risks, benefits and other options for treatment, the patient has consented to  Procedure(s): V-TACH ABLATION as a surgical intervention .  The patients' history has been reviewed, patient examined, no change in status, stable for surgery.  I have reviewed the patients' chart and labs.  Questions were answered to the patient's satisfaction.     Lewayne Bunting  MD   Date of Initial H&P:Since prior clinic visit 09/06/2011,  History reviewed, patient examined, no change in status, stable for surgery. Lewayne Bunting 09/12/2011

## 2011-09-12 NOTE — Op Note (Signed)
See dictated note. Dictation # [id number]. Lewayne Bunting 09/12/2011

## 2011-09-12 NOTE — Op Note (Signed)
NAMEPRINCE, [name] NO.:  [id number]  MEDICAL RECORD NO.:  [id number]  LOCATION:  [facility]                         FACILITY:  [facility]  PHYSICIAN:  Doylene Canning. Ladona Ridgel, MD    DATE OF BIRTH:  Mar 27, 1941  DATE OF PROCEDURE:  09/12/2011 DATE OF DISCHARGE:                              OPERATIVE REPORT   PROCEDURE PERFORMED:  Electrophysiologic study and RF catheter ablation of ventricular tachycardia.  INTRODUCTION:  The patient is a 70 year old male with an ischemic cardiomyopathy and recurrent ventricular tachycardia with multiple ICD shocks despite therapy with amiodarone.  The patient is now referred for VT ablation.  PROCEDURE:  After informed consent was obtained, the patient was taken to diagnostic EP lab in a fasting state.  After usual preparation and draping, intravenous fentanyl and midazolam was given for sedation.  A 6- [place] hexapolar catheter was inserted percutaneously into the right jugular vein and advanced to coronary sinus.  A 6-French quadripolar catheter was inserted percutaneously in the right femoral vein and advanced to the right ventricle.  A 6-French quadripolar catheter was inserted percutaneously in the right femoral vein and advanced to the His bundle region.  After measurement of the basic intervals, programmed ventricular stimulation was carried out from the right ventricle at a base drive cycle length of 161 msec.  S1, S2 and S1, S2, and S3 stimuli were delivered, and with an S1, S2 and S3 stimuli at 700/340/320 there was inducible VT.  This VT was a right bundle branch block VT with a superior axis.  Lead aVL and aVR were basically both positive.  This suggested that the VT was on the diaphragmatic surface of the left ventricle.  The VT was hemodynamically tolerated and ventricular pacing at 380 msec would terminate the VT.  At this point, a 7-French quadripolar electro-anatomic mapping catheter was inserted percutaneously through the  right femoral artery and advanced into the left ventricle retrograde across the aortic valve.  Prior to this, heparin was infused initially with a 7000-unit bolus and every 20 minutes ACTs were checked and heparin dose was re-bolused to keep the ACT above 200 seconds.  The 3-D electro-anatomic voltage mapping was carried out and an electro-anatomic shell of the patient's endocardial surface of the left ventricle was obtained.  This demonstrated a fairly prominent scar on the diaphragmatic wall about half distance from base to apex.  After extensive electro-anatomic mapping was carried out, programmed ventricular stimulation was again carried out and VT again induced.  This was the same clinical VT.  At this point, activation mapping was carried out.  Finally, pace mapping was carried out, and the area of interest was localized to the scar border on the inferior wall of the left ventricle about halfway from base to apex.  Extensive RF energy application was delivered at this site.  Following this, programmed ventricular stimulation was carried out, demonstrating no inducible VT.  It should be noted that with catheter manipulation, a second ventricular tachycardia was demonstrated.  This was a right bundle branch block VT with an inferior axis.  Attempts to map this VT were carried out and it was found to be higher on the lateral wall approximately  3 o'clock in the LAO projection.  Again, it was mapped earliest to the border of the scar.  Unfortunately, because it could not be reproducibly induced, no ablation therapy was directed at this second VT.  The concern was whether this was a clinical VT as it was never inducible before ablation and never inducible after ablation.  At this point, it was deemed most appropriate to terminate the procedure, and the catheters were removed, and the patient was returned to the recovery area for removal of the catheter sheath.  COMPLICATIONS:  There were  no immediate procedure complications.  RESULTS:  A.  Baseline ECG.  Baseline ECG demonstrates normal sinus rhythm with normal axis and intervals.  The HV interval was elevated at 78 msec, the QRS duration was 143 msec, the AH interval was 77 msec. B.  Rapid ventricular pacing.  Rapid ventricular pacing was carried out demonstrating VA dissociation 700 msec. C.  Programmed ventricular stimulation.  Programmed ventricular stimulation was carried out from the right ventricle at a base drive cycle length of 161 msec.  S1, S2 and S1, S2, S3 stimuli were delivered with the S1-S2 and S2-S3 stim interval stepwise decreased down to ventricular refractoriness.  During programmed ventricular stimulation, there was initially inducible VT as noted in the previous narrative and following catheter ablation, there was no inducible VT.  It should be noted that the patient had 2 non-clinical VTs that were induced with catheter manipulation but could not be induced with programmed ventricular stimulation, and therefore could not be mapped or ablation attempts delivered on these 2 non clinical VTs. D.  Rapid atrial pacing.  Rapid atrial pacing was carried out demonstrating an AV Wenckebach cycle length of 700 msec. E.  Programmed atrial stimulation.  Programmed atrial stimulation was carried out at a base drive cycle length of 096 msec and the S1-S2 interval was stepwise decreased down to 520 msec demonstrating AV node ERP.  During programmed atrial stimulation, there were no inducible SVT. F.  Arrhythmias observed. 1. Ventricular tachycardia initiation was with programmed ventricular     stimulation and with catheter manipulation.  Duration was     sustained.  Termination was with rapid ventricular pacing.     a.     Mapping.  Mapping of the patient's clinical VT demonstrated      it to be originating from the floor of the left ventricle at a      location of approximately 6 o'clock in the LAO position  about      halfway from base to apex.     b.     RF energy application.  Total of 5 prolonged RF energy      applications were delivered to the patient's clinical VT by      rendering it noninducible.  CONCLUSIONS:  This study demonstrates successful electrophysiologic study and RF catheter ablation of inducible ventricular tachycardia rendering the tachycardia not inducible.  During catheter manipulation, there was spontaneous occurring 2 additional VTs which could not be induced with programmed ventricular stimulation, and could not be readily induced with catheter manipulation and were not targeted for catheter ablation secondary to the limitations described above.     Doylene Canning. Ladona Ridgel, MD     GWT/MEDQ  D:  09/12/2011  T:  09/12/2011  Job:  [id number]  cc:   Duke Salvia, MD, [facility]

## 2011-09-13 NOTE — Progress Notes (Signed)
DISCHARGED HOME ACCAMPANIED BY FRIEND, STABLE, BELONGINGS WITH PT.

## 2011-09-13 NOTE — Discharge Summary (Signed)
Physician Discharge Summary  Patient ID: [name],  MRN: [id number], DOB/AGE: May 13, 1941 70 y.o.  Admit date: 09/12/2011 Discharge date: 09/13/2011  Primary Discharge Diagnosis:  1.  Ventricular tachycardia status post ablation this admission  Secondary Discharge Diagnosis:  1. CAD- s/p MI 1982; s/p DES to CFX 2011; s/p NSTEMI 4/12 in setting of VTach; cath 7/12: LM ok, LAD mid 30-40%, Dx's with 40%, mCFX stent patent, prox to mid RCA occluded with R to R and L to R collats. Medical Tx was continued  2. Bradycardia 3. PVD- s/p R fem to pop BPG 3/11; s/p L CFA BPG 7/03; s/p repair of inf AAA 6/03; s/p aorta to bilat renal BPG 6/03  4. Hypertensin 5. Hyperlipidemia 6. ICM- s/p Medtronic ICD implant 7. Cytopenia 8. Hypothyroidism 9. Renal insufficiency 10. TIA 11. PVD- s/p LCEA 2008   PROCEDURES THIS ADMISSION:  1. Ventricular tachycardia ablation on 09-12-11 by Dr. Ladona Ridgel.  This demonstrated successful electrophysiologic  study and RF catheter ablation of inducible ventricular tachycardia  rendering the tachycardia not inducible. During catheter manipulation,  there was spontaneous occurring 2 additional VTs which could not be  induced with programmed ventricular stimulation, and could not be  readily induced with catheter manipulation and were not targeted for  catheter ablation secondary to the limitations described above.   Brief HPI:  [name] is a 70 year old male with a history of coronary artery disease and ischemic cardiomyopathy status post ICD implantation.  He has had multiple ICD shocks that are associated with pre-syncope despite treatment with Amiodarone. He was referred to Dr. Ladona Ridgel in the outpatient setting for treatment options.  Risks, benefits, and alternatives to ablation were discussed with the patient and he wished to proceed.   Hospital Course: [name] was admitted on 09-12-11 for planned ablation of refractory ventricular tachycardia.  This was  carried out by Dr Ladona Ridgel with details as outlined above.  He was monitored on telemetry overnight which demonstrated sinus rhythm.  His groin incision was without hematoma or bruit.  The patient had no complaints of chest pain or shortness of breath.  Dr. Ladona Ridgel examined the patient and considered him stable for discharge to home.  Discharge Vitals: Blood pressure 134/63, pulse 52, temperature 97.5 F (36.4 C), temperature source Oral, resp. rate 12, height 6' (1.829 m), weight 201 lb 15.1 oz (91.6 kg), SpO2 98.00%.     Discharge Medications:  Current Discharge Medication List    CONTINUE these medications which have NOT CHANGED   Details  amiodarone (PACERONE) 400 MG tablet Take 400 mg by mouth daily.     aspirin 81 MG tablet Take 81 mg by mouth daily.     atorvastatin (LIPITOR) 80 MG tablet Take 80 mg by mouth daily.     Calcium-Magnesium-Zinc 500-250-12.5 MG TABS Take 2 tablets by mouth daily.     carvedilol (COREG) 3.125 MG tablet Take 3.125 mg by mouth 2 (two) times daily with a meal.     furosemide (LASIX) 20 MG tablet Take 20 mg by mouth daily.     levothyroxine (SYNTHROID) 112 MCG tablet Take 1 tablet (112 mcg total) by mouth daily.    loteprednol (LOTEMAX) 0.5 % ophthalmic suspension Place 1 drop into both eyes as directed.      meclizine (ANTIVERT) 25 MG tablet Take 25 mg by mouth. For vertigo    Multiple Vitamin (MULTIVITAMIN) tablet Take 1 tablet by mouth daily.     Multiple Vitamins-Minerals (EYE VITAMINS PO) Take by  mouth 2 (two) times daily.      NIASPAN 500 MG CR tablet Take 1,500 mg by mouth daily.     ramipril (ALTACE) 5 MG capsule Take 5 mg by mouth daily.      clopidogrel (PLAVIX) 75 MG tablet Take 75 mg by mouth daily.     nitroGLYCERIN (NITROSTAT) 0.4 MG SL tablet Place 0.4 mg under the tongue every 5 (five) minutes as needed. For chest pain        Disposition: Dr. Ladona Ridgel examined the patient on 09-13-11 and considered him stable for discharge to  home.  Discharge Orders    Future Appointments: Provider: Department: Dept Phone: Center:   09/25/2011 3:15 PM Peter [place], MD [facility] [phone] None   10/16/2011 10:15 AM Lewayne Bunting, MD [facility] [phone] [facility]   02/19/2012 9:00 AM [facility] [phone] [facility]   02/19/2012 10:00 AM [facility] [phone] [facility]   02/19/2012 10:30 AM [facility] [phone] [facility]   02/19/2012 11:30 AM Pryor Ochoa, MD [facility] [phone] [facility]     Future Orders Please Complete By Expires   Diet - low sodium heart healthy      Increase activity slowly      Discharge wound care:      Comments:   Wound Care: You may wash cath site gently with soap and water. Keep cath site clean and dry. If you notice pain, swelling, bleeding or pus at your cath site, please call/return!    Driving Restrictions      Comments:   No driving for 2 days.       Duration of Discharge Encounter: Greater than 30 minutes including physician time.  Signed, Gypsy Balsam, RN, BSN 09/13/2011, 10:42 AM

## 2011-09-13 NOTE — Progress Notes (Signed)
Subjective:  No chest pain or sob or palpitations.  Objective:  Vital Signs in the last 24 hours: Temp:  [97.4 F (36.3 C)-98.4 F (36.9 C)] 98.4 F (36.9 C) (11/08 0400) Pulse Rate:  [51-73] 52  (11/08 0600) Resp:  [11-20] 12  (11/08 0600) BP: (86-133)/(36-81) 98/42 mmHg (11/08 0600) SpO2:  [95 %-100 %] 98 % (11/08 0600) Weight:  [201 lb 15.1 oz (91.6 kg)] 201 lb 15.1 oz (91.6 kg) (11/07 1550)  Intake/Output from previous day: 11/07 0701 - 11/08 0700 In: 130 [I.V.:130] Out: 1075 [Urine:1075] Intake/Output from this shift:    Physical Exam: Well appearing NAD HEENT: Unremarkable Neck:  No JVD, no thyromegally Back:  No CVA tenderness Lungs:  Clear with no wheezes, rales, or rhonchi HEART:  Regular rate rhythm, no murmurs, no rubs, no clicks Abd:  Flat, positive bowel sounds, no organomegally, no rebound, no guarding Ext:  2 plus pulses, no edema, no cyanosis, no clubbing. LLeg is warm and no bruit or hematoma. Skin:  No rashes no nodules Neuro:  CN II through XII intact, motor grossly intact  Lab Results: No results found for this basename: WBC:2,HGB:2,PLT:2 in the last 72 hours No results found for this basename: NA:2,K:2,CL:2,CO2:2,GLUCOSE:2,BUN:2,CREATININE:2 in the last 72 hours No results found for this basename: TROPONINI:2,CK,MB:2 in the last 72 hours Hepatic Function Panel No results found for this basename: PROT,ALBUMIN,AST,ALT,ALKPHOS,BILITOT,BILIDIR,IBILI in the last 72 hours No results found for this basename: CHOL in the last 72 hours No results found for this basename: PROTIME in the last 72 hours   Cardiac Studies: Tele - NSR. Rare PVC's.  Assessment/Plan:  VT - He is s/p EPS/RFA of VT. He has done well overnight. Will plan to discharge home later this morning. Followup with me in several weeks (3-4). Continue current dose of meds. Will plan to wean amiodarone after several weeks.    LOS: 1 day    Lewayne Bunting 09/13/2011, 7:23 AM

## 2011-09-20 NOTE — Op Note (Signed)
PHYSICIAN: Doylene Canning. Ladona Ridgel, MD DATE OF BIRTH: 31-Oct-1941  DATE OF PROCEDURE: 09/12/2011  DATE OF DISCHARGE:  OPERATIVE REPORT  PROCEDURE PERFORMED: Electrophysiologic study and RF catheter ablation  of ventricular tachycardia.  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 The patient is now referred for  VT ablation.  PROCEDURE: After informed consent was obtained, the patient was taken  to diagnostic EP lab in a fasting state. After usual preparation and  draping, intravenous fentanyl and midazolam was given for sedation. A 6-  [place] hexapolar catheter was inserted percutaneously into the right  jugular vein and advanced to coronary sinus. A 6-French quadripolar  catheter was inserted percutaneously in the right femoral vein and  advanced to the right ventricle. A 6-French quadripolar catheter was  inserted percutaneously in the right femoral vein and advanced to the  His bundle region. After measurement of the basic intervals, programmed  ventricular stimulation was carried out from the right ventricle at a  base drive cycle length of 119 msec. S1, S2 and S1, S2, and S3 stimuli  were delivered, and with an S1, S2 and S3 stimuli at 700/340/320 there  was inducible VT. This VT was a right bundle branch block VT with a  superior axis. Lead aVL and aVR were basically both positive. This  suggested that the VT was on the diaphragmatic surface of the left  ventricle. The VT was hemodynamically tolerated and ventricular pacing  at 380 msec would terminate the VT. At this point, a 7-French  quadripolar electro-anatomic mapping catheter was inserted  percutaneously through the right femoral artery and advanced into the  left ventricle retrograde across the aortic valve. Prior to this,  heparin was infused initially with a 7000-unit bolus and every 20  minutes  ACTs were checked and heparin dose was re-bolused to keep the  ACT above 200 seconds. The 3-D electro-anatomic voltage mapping was  carried out and an electro-anatomic shell of the patient's endocardial  surface of the left ventricle was obtained. This demonstrated a fairly  prominent scar on the diaphragmatic wall about half distance from base  to apex. After extensive electro-anatomic mapping was carried out,  programmed ventricular stimulation was again carried out and VT again  induced. This was the same clinical VT. At this point, activation  mapping was carried out. Finally, pace mapping was carried out, and the  area of interest was localized to the scar border on the inferior wall  of the left ventricle about halfway from base to apex. Extensive RF  energy application was delivered at this site. Following this,  programmed ventricular stimulation was carried out, demonstrating no  inducible VT. It should be noted that with catheter manipulation, a  second ventricular tachycardia was demonstrated. This was a right  bundle branch block VT with an inferior axis. Attempts to map this VT  were carried out and it was found to be higher on the lateral wall  approximately 3 o'clock in the LAO projection. Again, it was mapped  earliest to the border of the scar. Unfortunately, because it could not  be reproducibly induced, no ablation therapy was directed at this second  VT. The concern was whether this was a clinical VT as it was never  inducible before ablation and never inducible after ablation. At this  point, it was deemed most appropriate to terminate the procedure, and  the catheters were removed,  and the patient was returned to the recovery  area for removal of the catheter sheath.  COMPLICATIONS: There were no immediate procedure complications.  RESULTS: A. Baseline ECG. Baseline ECG demonstrates normal sinus  rhythm with normal axis and intervals. The HV interval was elevated at    78 msec, the QRS duration was 143 msec, the AH interval was 77 msec.  B. Rapid ventricular pacing. Rapid ventricular pacing was carried out  demonstrating VA dissociation 700 msec.  C. Programmed ventricular stimulation. Programmed ventricular  stimulation was carried out from the right ventricle at a base drive  cycle length of 161 msec. S1, S2 and S1, S2, S3 stimuli were delivered  with the S1-S2 and S2-S3 stim interval stepwise decreased down to  ventricular refractoriness. During programmed ventricular stimulation,  there was initially inducible VT as noted in the previous narrative and  following catheter ablation, there was no inducible VT. It should be  noted that the patient had 2 non-clinical VTs that were induced with  catheter manipulation but could not be induced with programmed  ventricular stimulation, and therefore could not be mapped or ablation  attempts delivered on these 2 non clinical VTs.  D. Rapid atrial pacing. Rapid atrial pacing was carried out  demonstrating an AV Wenckebach cycle length of 700 msec.  E. Programmed atrial stimulation. Programmed atrial stimulation was  carried out at a base drive cycle length of 096 msec and the S1-S2  interval was stepwise decreased down to 520 msec demonstrating AV node  ERP. During programmed atrial stimulation, there were no inducible SVT.  F. Arrhythmias observed.  1. Ventricular tachycardia initiation was with programmed ventricular  stimulation and with catheter manipulation. Duration was  sustained. Termination was with rapid ventricular pacing.  a. Mapping. Mapping of the patient's clinical VT demonstrated  it to be originating from the floor of the left ventricle at a  location of approximately 6 o'clock in the LAO position about  halfway from base to apex.  b. RF energy application. Total of 5 prolonged RF energy  applications were delivered to the patient's clinical VT by  rendering it noninducible.  CONCLUSIONS:  This study demonstrates successful electrophysiologic  study and RF catheter ablation of inducible ventricular tachycardia  rendering the tachycardia not inducible. During catheter manipulation,  there was spontaneous occurring 2 additional VTs which could not be  induced with programmed ventricular stimulation, and could not be  readily induced with catheter manipulation and were not targeted for  catheter ablation secondary to the limitations described above.  Doylene Canning. Ladona Ridgel, MD  GWT/MEDQ D: 09/12/2011 T: 09/12/2011 Job: [id number]  cc

## 2011-09-21 ENCOUNTER — Encounter: Payer: Self-pay | Admitting: *Deleted

## 2011-09-25 ENCOUNTER — Ambulatory Visit (INDEPENDENT_AMBULATORY_CARE_PROVIDER_SITE_OTHER): Payer: Medicare Other | Admitting: Cardiology

## 2011-09-25 ENCOUNTER — Encounter: Payer: Self-pay | Admitting: Cardiology

## 2011-09-25 VITALS — BP 101/63 | HR 64 | Ht 72.0 in | Wt 201.0 lb

## 2011-09-25 DIAGNOSIS — I472 Ventricular tachycardia: Secondary | ICD-10-CM

## 2011-09-25 DIAGNOSIS — I739 Peripheral vascular disease, unspecified: Secondary | ICD-10-CM

## 2011-09-25 DIAGNOSIS — I2510 Atherosclerotic heart disease of native coronary artery without angina pectoris: Secondary | ICD-10-CM

## 2011-09-25 DIAGNOSIS — I509 Heart failure, unspecified: Secondary | ICD-10-CM

## 2011-09-25 NOTE — Assessment & Plan Note (Signed)
Recurrent on amiodarone therapy. Status post ventricular tachycardia ablation on 09/12/2011. Clinically doing very well. He will keep his followup with Dr. Graciela Husbands. If his arrhythmia remains controlled hopefully we will be able to reduce his amiodarone dose in the future.

## 2011-09-25 NOTE — Assessment & Plan Note (Signed)
Well compensated on ACE inhibitor, beta blocker, and Lasix. Continue current therapy and continue to restrict his sodium intake. I will follow up again in 4 months.

## 2011-09-25 NOTE — Patient Instructions (Signed)
Continue your current medications.  I will see you again in 4 months.   

## 2011-09-25 NOTE — Progress Notes (Signed)
[name] Date of Birth: 21-Oct-1941   History of Present Illness: [name] is seen for followup today. He reports that he has been doing very well. He denies any dyspnea, chest pain, edema, or palpitations. He recently was noted to the hospital with recurrent ventricular tachycardia associated with defibrillator shocks. He underwent a ventricular tachycardia ablation by Dr. Ladona Ridgel. This procedure went very well. Since that time he has had no recurrent tachycardia or defibrillator discharges. He is back working as a Electrical engineer.  Current Outpatient Prescriptions on File Prior to Visit  Medication Sig Dispense Refill  . amiodarone (PACERONE) 400 MG tablet Take 400 mg by mouth daily.       [name] aspirin 81 MG tablet Take 81 mg by mouth daily.       [name] atorvastatin (LIPITOR) 80 MG tablet Take 80 mg by mouth daily.       . Calcium-Magnesium-Zinc 500-250-12.5 MG TABS Take 2 tablets by mouth daily.       . carvedilol (COREG) 3.125 MG tablet Take 3.125 mg by mouth 2 (two) times daily with a meal.       . clopidogrel (PLAVIX) 75 MG tablet Take 75 mg by mouth daily.       . furosemide (LASIX) 20 MG tablet Take 20 mg by mouth daily.       [name] levothyroxine (SYNTHROID) 112 MCG tablet Take 1 tablet (112 mcg total) by mouth daily.      [name] loteprednol (LOTEMAX) 0.5 % ophthalmic suspension Place 1 drop into both eyes as directed.        . Multiple Vitamin (MULTIVITAMIN) tablet Take 1 tablet by mouth daily.       . Multiple Vitamins-Minerals (EYE VITAMINS PO) Take by mouth 2 (two) times daily.        [name] NIASPAN 500 MG CR tablet Take 1,500 mg by mouth daily.       . nitroGLYCERIN (NITROSTAT) 0.4 MG SL tablet Place 0.4 mg under the tongue every 5 (five) minutes as needed. For chest pain      . ramipril (ALTACE) 5 MG capsule Take 5 mg by mouth daily.          Allergies  Allergen Reactions  . Meperidine Hcl Other (See Comments)    "CAN'T MOVE" CATATONIC PER PT.    Past Medical History  Diagnosis  Date  . CAD (coronary artery disease)     s/p MI 1982;  s/p DES to CFX 2011;  s/p NSTEMI 4/12 in setting of VTach;  cath 7/12:  LM ok, LAD mid 30-40%, Dx's with 40%, mCFX stent patent, prox to mid RCA occluded with R to R and L to R collats.  Medical Tx was continued  . Bradycardia   . Ventricular tachycardia     s/p AICD;   h/o ICD shock;  Amiodarone Rx  . 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 HTN (hypertension)   . HLD (hyperlipidemia)   . Cytopenia   . Hypothyroidism   . Renal insufficiency   . Claudication   . 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 Chronic systolic heart failure   . Ischemic cardiomyopathy     echo 7/12:  EF 25-30%, mild AI, mod MR, mod LAE  . TIA (transient ischemic attack)   .  CVD (cerebrovascular disease)     left CEA 2008  . Arthritis   . Stroke   . Anemia     Past Surgical History  Procedure Date  . Femoral-popliteal bypass graft 2011  . Cardiac defibrillator placement     [employer]tronic  . Revascularization / in-situ graft leg   . Renal artery bypass   . Back surgery   . Lumbar laminectomy   . Knee surgery     bilateral  . Elbow surgery     right  . Cholecystectomy   . Lipoma excision     scalp  . Bone spur     heel  . Cataract extraction     bilateral  . Carotid stent     left  . Carotid endarterectomy   . Doppler echocardiography 2011    History  Smoking status  . Former Smoker  . Types: Cigarettes  . Quit date: 11/05/2001  Smokeless tobacco  . Not on file    History  Alcohol Use: Not on file    Family History  Problem Relation Age of Onset  . Coronary artery disease    . Heart attack Mother   . Coronary artery disease Mother   . Coronary artery disease Father   . Stroke Father     Review of Systems: As noted in history of present illness. All other systems were reviewed and are  negative.  Physical Exam: BP 101/63  Pulse 64  Ht 6' (1.829 m)  Wt 201 lb (91.173 kg)  BMI 27.26 kg/m2 He is a pleasant white male in no acute distress. Normocephalic, atraumatic. Pupils are equal round and reactive to light and accommodation. Extraocular movements are full. Oropharynx is clear. Neck is without JVD, adenopathy, thyromegaly, or bruits. Lungs are clear. Cardiac exam reveals an irregular rhythm without gallop or murmur. Abdomen is soft and nontender. He has bilateral femoral bruits. He has chronic trace to 1+ right lower extremity edema. Pedal pulses are good. He is alert and oriented x3. Cranial nerves II through XII are intact. LABORATORY DATA:   Assessment / Plan:

## 2011-09-25 NOTE — Assessment & Plan Note (Signed)
He has no anginal symptoms at this time. We will continue with his anti-anginal therapy.

## 2011-10-08 ENCOUNTER — Telehealth: Payer: Self-pay | Admitting: Cardiology

## 2011-10-08 NOTE — Telephone Encounter (Signed)
Pt is having colonoscopy and he needs to be off his plavix from 5-7days and he wants to get this confirmed prior to scheduling his procedure

## 2011-10-08 NOTE — Telephone Encounter (Signed)
Called stating he needs to have colonoscopy done and wants to know if can stop Plavix 5-7 days prior. Per Dr. [place] can stop Plavix. States when it has been scheduled the office will probably call us to verify. Advised that would be fine.

## 2011-10-16 ENCOUNTER — Encounter: Payer: Medicare Other | Admitting: Internal Medicine

## 2011-10-18 ENCOUNTER — Encounter: Payer: Self-pay | Admitting: Internal Medicine

## 2011-10-18 ENCOUNTER — Ambulatory Visit (INDEPENDENT_AMBULATORY_CARE_PROVIDER_SITE_OTHER): Payer: Medicare Other | Admitting: Internal Medicine

## 2011-10-18 DIAGNOSIS — Z95810 Presence of automatic (implantable) cardiac defibrillator: Secondary | ICD-10-CM

## 2011-10-18 DIAGNOSIS — I255 Ischemic cardiomyopathy: Secondary | ICD-10-CM

## 2011-10-18 DIAGNOSIS — I472 Ventricular tachycardia, unspecified: Secondary | ICD-10-CM

## 2011-10-18 DIAGNOSIS — R634 Abnormal weight loss: Secondary | ICD-10-CM

## 2011-10-18 DIAGNOSIS — I2589 Other forms of chronic ischemic heart disease: Secondary | ICD-10-CM

## 2011-10-18 DIAGNOSIS — I509 Heart failure, unspecified: Secondary | ICD-10-CM

## 2011-10-18 LAB — ICD DEVICE OBSERVATION
BATTERY VOLTAGE: 2.75 V
FVT: 0
RV LEAD THRESHOLD: 1 V
TZAT-0001SLOWVT: 1
TZAT-0001SLOWVT: 2
TZAT-0011SLOWVT: 10 ms
TZAT-0011SLOWVT: 10 ms
TZAT-0012FASTVT: 200 ms
TZAT-0013FASTVT: 1
TZAT-0013SLOWVT: 6
TZAT-0013SLOWVT: 6
TZAT-0018SLOWVT: NEGATIVE
TZAT-0018SLOWVT: NEGATIVE
TZAT-0019FASTVT: 8 V
TZAT-0019SLOWVT: 8 V
TZAT-0019SLOWVT: 8 V
TZAT-0020FASTVT: 1.6 ms
TZON-0003FASTVT: 280 ms
TZON-0005SLOWVT: 16
TZON-0008FASTVT: 0 ms
TZON-0008SLOWVT: 0 ms
TZST-0001FASTVT: 4
TZST-0001FASTVT: 6
TZST-0001SLOWVT: 4
TZST-0003FASTVT: 30 J
TZST-0003FASTVT: 35 J
TZST-0003FASTVT: 35 J
TZST-0003SLOWVT: 30 J
TZST-0003SLOWVT: 35 J
TZST-0003SLOWVT: 35 J
VF: 0

## 2011-10-18 NOTE — Assessment & Plan Note (Signed)
Currently well compensated

## 2011-10-18 NOTE — Progress Notes (Signed)
HPI  [name] is a 70 y.o. male Seen in followup for ventricular tachycardia occurning in the setting of  ischemic heart disease with prior revascularization and prior MI He had clinical recurrent VT in the summer 2012 associated with a decrease in his amiodarone dose Which resulted an ICD discharge Catheterization at that time demonstratedDouble-vessel coronary artery disease with Patent stent in the mid circumflex artery and Chronic total occlusion of the mid right coronary artery that is  Unchanged. Ejection fraction of 25-30% with moderate MR  Scan--large inferior wall infarct with severely depressed LV function and ejection fraction of 24%  Because of recurrent VT followed up titration of amiodarone, he was seen by Dr. Ladona Ridgel and underwent catheter ablation November 2012 . The VT was localized to the scar border on the inferior wall  of the left ventricle about halfway from base to apex.Following ablation, it was no longer inducible. He did have 2 other nonclinical VTs.   Past Medical History  Diagnosis Date  . CAD (coronary artery disease)     s/p MI 1982;  s/p DES to CFX 2011;  s/p NSTEMI 4/12 in setting of VTach;  cath 7/12:  LM ok, LAD mid 30-40%, Dx's with 40%, mCFX stent patent, prox to mid RCA occluded with R to R and L to R collats.  Medical Tx was continued  . Bradycardia   . Ventricular tachycardia     s/p AICD;   h/o ICD shock;  Amiodarone Rx  . 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 HTN (hypertension)   . HLD (hyperlipidemia)   . Cytopenia   . Hypothyroidism   . Renal insufficiency   . Claudication   . 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 Chronic systolic heart failure   . Ischemic cardiomyopathy     echo 7/12:  EF 25-30%, mild AI, mod MR, mod LAE  . TIA (transient ischemic attack)   . CVD (cerebrovascular disease)    left CEA 2008  . Arthritis   . Stroke   . Anemia     Past Surgical History  Procedure Date  . Femoral-popliteal bypass graft 2011  . Cardiac defibrillator placement     [employer]tr[employer]  . Revascularization / in-situ graft leg   . Renal artery bypass   . Back surgery   . Lumbar laminectomy   . Knee surgery     bilateral  . Elbow surgery     right  . Cholecystectomy   . Lipoma excision     scalp  . Bone spur     heel  . Cataract extraction     bilateral  . Carotid stent     left  . Carotid endarterectomy   . Doppler echocardiography 2011    Current Outpatient Prescriptions  Medication Sig Dispense Refill  . amiodarone (PACERONE) 400 MG tablet Take 400 mg by mouth daily.       [name] aspirin 81 MG tablet Take 81 mg by mouth daily.       [name] atorvastatin (LIPITOR) 80 MG tablet Take 80 mg by mouth daily.       . Calcium-Magnesium-Zinc 500-250-12.5 MG TABS Take 2 tablets by mouth daily.       . carvedilol (COREG) 3.125 MG tablet Take 3.125 mg by mouth 2 (  two) times daily with a meal.       . clopidogrel (PLAVIX) 75 MG tablet Take 75 mg by mouth daily.       . furosemide (LASIX) 20 MG tablet Take 20 mg by mouth daily.       [name] levothyroxine (SYNTHROID) 112 MCG tablet Take 1 tablet (112 mcg total) by mouth daily.      [name] loteprednol (LOTEMAX) 0.5 % ophthalmic suspension Place 1 drop into both eyes as directed.        . Multiple Vitamin (MULTIVITAMIN) tablet Take 1 tablet by mouth daily.       . Multiple Vitamins-Minerals (EYE VITAMINS PO) Take by mouth 2 (two) times daily.        [name] NIASPAN 500 MG CR tablet Take 1,500 mg by mouth daily.       . nitroGLYCERIN (NITROSTAT) 0.4 MG SL tablet Place 0.4 mg under the tongue every 5 (five) minutes as needed. For chest pain      . ramipril (ALTACE) 5 MG capsule Take 5 mg by mouth daily.          Allergies  Allergen Reactions  . Meperidine Hcl Other (See Comments)    "CAN'T MOVE" CATATONIC PER PT.    Review of Systems negative except from HPI  and PMH  Physical Exam Well developed and well nourished in no acute distress HENT normal E scleral and icterus clear Neck Supple JVP flat; carotids brisk and full Clear to ausculation Regular rate and rhythm, no murmurs gallops or rub Soft with active bowel sounds No clubbing cyanosis none Edema Alert and oriented, grossly normal motor and sensory function Skin Warm and Dry   Assessment and  Plan

## 2011-10-18 NOTE — Assessment & Plan Note (Signed)
Currently stable we'll continue current medications; at his next visit we will consider the addition of Aldactone

## 2011-10-18 NOTE — Patient Instructions (Signed)
Your physician recommends that you schedule a follow-up appointment in: FEB 2013 WITH DR Graciela Husbands Your physician recommends that you continue on your current medications as directed. Please refer to the Current Medication list given to you today.

## 2011-10-18 NOTE — Assessment & Plan Note (Signed)
The patient's device was interrogated.  The information was reviewed. No changes were made in the programming.    

## 2011-10-18 NOTE — Assessment & Plan Note (Signed)
Patient has had recurrent ventricular tachycardia since his ablation but none in the last 4 weeks. We will continue him on his current dose of amiodarone; however, in the event that he remains  VT free over the next 6-8 weeks we will begin to down titrate

## 2011-10-18 NOTE — Assessment & Plan Note (Signed)
The patient is concerned about weight loss. We reviewed records over the last year and a half to which time there has been a 15 pound unintentional weight loss. He Continues to eat  Well   I've asked that he followup with his primary care physician about this we'll also keep track of his weight. Last TSH was in July and was normal

## 2011-11-06 HISTORY — PX: LIPOMA EXCISION: SHX5283

## 2012-01-01 ENCOUNTER — Encounter: Payer: Self-pay | Admitting: Internal Medicine

## 2012-01-01 ENCOUNTER — Ambulatory Visit (INDEPENDENT_AMBULATORY_CARE_PROVIDER_SITE_OTHER): Payer: Medicare Other | Admitting: Internal Medicine

## 2012-01-01 DIAGNOSIS — I509 Heart failure, unspecified: Secondary | ICD-10-CM

## 2012-01-01 DIAGNOSIS — I2589 Other forms of chronic ischemic heart disease: Secondary | ICD-10-CM

## 2012-01-01 DIAGNOSIS — I472 Ventricular tachycardia, unspecified: Secondary | ICD-10-CM

## 2012-01-01 DIAGNOSIS — Z95810 Presence of automatic (implantable) cardiac defibrillator: Secondary | ICD-10-CM

## 2012-01-01 DIAGNOSIS — I255 Ischemic cardiomyopathy: Secondary | ICD-10-CM

## 2012-01-01 LAB — ICD DEVICE OBSERVATION
DEV-0020ICD: NEGATIVE
FVT: 0
PACEART VT: 0
RV LEAD AMPLITUDE: 7.2 mv
RV LEAD THRESHOLD: 1 V
TZAT-0004FASTVT: 8
TZAT-0005SLOWVT: 75 pct
TZAT-0005SLOWVT: 84 pct
TZAT-0011FASTVT: 10 ms
TZAT-0011SLOWVT: 10 ms
TZAT-0011SLOWVT: 10 ms
TZAT-0012FASTVT: 200 ms
TZAT-0012SLOWVT: 200 ms
TZAT-0012SLOWVT: 200 ms
TZAT-0013FASTVT: 1
TZAT-0013SLOWVT: 6
TZAT-0013SLOWVT: 6
TZAT-0018SLOWVT: NEGATIVE
TZAT-0018SLOWVT: NEGATIVE
TZAT-0019FASTVT: 8 V
TZAT-0019SLOWVT: 8 V
TZAT-0020FASTVT: 1.6 ms
TZON-0003FASTVT: 280 ms
TZON-0003SLOWVT: 500 ms
TZON-0004SLOWVT: 48
TZON-0005SLOWVT: 16
TZON-0008FASTVT: 0 ms
TZON-0008SLOWVT: 0 ms
TZST-0001FASTVT: 3
TZST-0001FASTVT: 4
TZST-0001SLOWVT: 4
TZST-0001SLOWVT: 6
TZST-0003FASTVT: 30 J
TZST-0003FASTVT: 35 J
TZST-0003FASTVT: 35 J
TZST-0003SLOWVT: 35 J
TZST-0003SLOWVT: 35 J
VENTRICULAR PACING ICD: 4 pct

## 2012-01-01 LAB — TSH: TSH: 1.36 u[IU]/mL (ref 0.35–5.50)

## 2012-01-01 LAB — HEPATIC FUNCTION PANEL: Albumin: 3.7 g/dL (ref 3.5–5.2)

## 2012-01-01 MED ORDER — CARVEDILOL 6.25 MG PO TABS: 6.2500 mg | ORAL_TABLET | Freq: Two times a day (BID) | ORAL | Status: DC

## 2012-01-01 NOTE — Progress Notes (Signed)
HPI  [name] is a 71 y.o. male Seen in followup for ventricular tachycardia occuring in the setting of ischemic heart disease with prior revascularization and prior MI He had clinical recurrent VT in the summer 2012 associated with a decrease in his amiodarone dose Which resulted an ICD discharge  Catheterization at that time demonstrated Double-vessel coronary artery disease with Patent stent in the mid circumflex artery and Chronic total occlusion of the mid right coronary artery that is u nchanged. Ejection fraction of 25-30% with moderate MR  Scan--large inferior wall infarct with severely depressed LV function and ejection fraction of 24%  Because of recurrent VT followed up titration of amiodarone, he was seen by Dr. Ladona Ridgel and underwent catheter ablation November 2012 . The VT was localized to the scar border on the inferior wall of the left ventricle about halfway from base to apex.Following ablation, it was no longer inducible. He did have 2 other nonclinical VTs.  Since then he has done pretty well clinically He played golf a all week last week.  He has no recollection of any untoward events on January 30.  His breathing and chest pain are at baseline there is been no edema Past Medical History  Diagnosis Date  . CAD (coronary artery disease)     s/p MI 1982;  s/p DES to CFX 2011;  s/p NSTEMI 4/12 in setting of VTach;  cath 7/12:  LM ok, LAD mid 30-40%, Dx's with 40%, mCFX stent patent, prox to mid RCA occluded with R to R and L to R collats.  Medical Tx was continued  . Bradycardia   . Ventricular tachycardia     s/p AICD;   h/o ICD shock;  Amiodarone Rx  . 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 HTN (hypertension)   . HLD (hyperlipidemia)   . Cytopenia   . Hypothyroidism   . Renal insufficiency   . Claudication   . Myocardial  infarction     old inferior  . Chronic systolic heart failure   . Ischemic cardiomyopathy     echo 7/12:  EF 25-30%, mild AI, mod MR, mod LAE  . TIA (transient ischemic attack)   . CVD (cerebrovascular disease)     left CEA 2008  . Arthritis   . Stroke   . Anemia     Past Surgical History  Procedure Date  . Femoral-popliteal bypass graft 2011  . Cardiac defibrillator placement     [employer]tr[employer]  . Revascularization / in-situ graft leg   . Renal artery bypass   . Back surgery   . Lumbar laminectomy   . Knee surgery     bilateral  . Elbow surgery     right  . Cholecystectomy   . Lipoma excision     scalp  . Bone spur     heel  . Cataract extraction     bilateral  . Carotid stent     left  . Carotid endarterectomy   . Doppler echocardiography 2011    Current Outpatient Prescriptions  Medication Sig Dispense Refill  . amiodarone (PACERONE) 400 MG tablet Take 400 mg by mouth daily.       [name] aspirin 81 MG tablet Take 81 mg by mouth daily.       [name] atorvastatin (LIPITOR) 80  MG tablet Take 80 mg by mouth daily.       . Calcium-Magnesium-Zinc 500-250-12.5 MG TABS Take 2 tablets by mouth daily.       . carvedilol (COREG) 3.125 MG tablet Take 3.125 mg by mouth 2 (two) times daily with a meal.       . clopidogrel (PLAVIX) 75 MG tablet Take 75 mg by mouth daily.       . furosemide (LASIX) 20 MG tablet Take 20 mg by mouth daily.       [name] levothyroxine (SYNTHROID) 112 MCG tablet Take 1 tablet (112 mcg total) by mouth daily.      [name] loteprednol (LOTEMAX) 0.5 % ophthalmic suspension Place 1 drop into both eyes as directed.        . Multiple Vitamin (MULTIVITAMIN) tablet Take 1 tablet by mouth daily.       . Multiple Vitamins-Minerals (EYE VITAMINS PO) Take by mouth 2 (two) times daily.        [name] NIASPAN 500 MG CR tablet Take 1,500 mg by mouth daily.       . nitroGLYCERIN (NITROSTAT) 0.4 MG SL tablet Place 0.4 mg under the tongue every 5 (five) minutes as needed. For chest pain      .  ramipril (ALTACE) 5 MG capsule Take 5 mg by mouth daily.          Allergies  Allergen Reactions  . Meperidine Hcl Other (See Comments)    "CAN'T MOVE" CATATONIC PER PT.    Review of Systems negative except from HPI and PMH  Physical Exam BP 110/64  Pulse 56  Ht 6' (1.829 m)  Wt 201 lb 12.8 oz (91.536 kg)  BMI 27.37 kg/m2 Well developed and well nourished in no acute distress HENT normal E scleral and icterus clear Neck Supple JVP flat; carotids brisk and full Clear to ausculation Regular rate and rhythm, 2/6 murmur murmurs gallops or rub Soft with active bowel sounds No clubbing cyanosis none Edema Alert and oriented, grossly normal motor and sensory function Skin Warm and Dry   Assessment and  Plan

## 2012-01-01 NOTE — Assessment & Plan Note (Signed)
Stable on current meds 

## 2012-01-01 NOTE — Assessment & Plan Note (Signed)
A flurry of VT NS on Jan 30  Without symptoms Continue amio at current dose and check surveillance labs today

## 2012-01-01 NOTE — Assessment & Plan Note (Signed)
Well compensated; we will increase his carvedilol from 3-6.25 twice a day

## 2012-01-01 NOTE — Patient Instructions (Addendum)
LABS today:  TSH, LFT.  Your physician recommends that you schedule a follow-up appointment in: 3 months with Dr. Graciela Husbands.  Increase Coreg to 6.25mg  twice daily.

## 2012-01-11 ENCOUNTER — Other Ambulatory Visit: Payer: Self-pay | Admitting: Internal Medicine

## 2012-01-21 ENCOUNTER — Encounter: Payer: Self-pay | Admitting: Internal Medicine

## 2012-01-25 ENCOUNTER — Ambulatory Visit (INDEPENDENT_AMBULATORY_CARE_PROVIDER_SITE_OTHER): Payer: Medicare Other | Admitting: Cardiology

## 2012-01-25 ENCOUNTER — Encounter: Payer: Self-pay | Admitting: Cardiology

## 2012-01-25 VITALS — BP 94/60 | HR 64 | Ht 72.0 in | Wt 203.0 lb

## 2012-01-25 DIAGNOSIS — I509 Heart failure, unspecified: Secondary | ICD-10-CM

## 2012-01-25 DIAGNOSIS — I5022 Chronic systolic (congestive) heart failure: Secondary | ICD-10-CM

## 2012-01-25 DIAGNOSIS — I472 Ventricular tachycardia: Secondary | ICD-10-CM

## 2012-01-25 DIAGNOSIS — I2510 Atherosclerotic heart disease of native coronary artery without angina pectoris: Secondary | ICD-10-CM

## 2012-01-25 NOTE — Progress Notes (Signed)
[name] Date of Birth: Nov 01, 1941   History of Present Illness: [name] is seen for followup today. He reports he is doing very well. He has had no increase in dyspnea, chest pain, or edema. Since his ventricular tachycardia ablation by Dr. Ladona Ridgel in November he has had no further defibrillator shocks. Recent ICD evaluation by Dr. Graciela Husbands revealed a flurry of nonsustained ventricular tachycardia at the end of January but otherwise his rhythm has been well-controlled on amiodarone 400 mg daily. His carvedilol dose was increased to 6.25 mg twice a day. Although his blood pressure is low he denies any significant symptoms of lightheadedness or fatigue.  Current Outpatient Prescriptions on File Prior to Visit  Medication Sig Dispense Refill  . amiodarone (PACERONE) 400 MG tablet Take 400 mg by mouth daily.       [name] amiodarone (PACERONE) 400 MG tablet take 1 tablet by mouth twice a day  60 tablet  6  . aspirin 81 MG tablet Take 81 mg by mouth daily.       [name] atorvastatin (LIPITOR) 80 MG tablet Take 80 mg by mouth daily.       . Calcium-Magnesium-Zinc 500-250-12.5 MG TABS Take 2 tablets by mouth daily.       . carvedilol (COREG) 6.25 MG tablet Take 1 tablet (6.25 mg total) by mouth 2 (two) times daily with a meal.  60 tablet  11  . clopidogrel (PLAVIX) 75 MG tablet Take 75 mg by mouth daily.       . furosemide (LASIX) 20 MG tablet Take 20 mg by mouth daily.       [name] levothyroxine (SYNTHROID) 112 MCG tablet Take 1 tablet (112 mcg total) by mouth daily.      [name] loteprednol (LOTEMAX) 0.5 % ophthalmic suspension Place 1 drop into both eyes as directed.        . Multiple Vitamin (MULTIVITAMIN) tablet Take 1 tablet by mouth daily.       . Multiple Vitamins-Minerals (EYE VITAMINS PO) Take by mouth 2 (two) times daily.        [name] NIASPAN 500 MG CR tablet Take 1,500 mg by mouth daily.       . nitroGLYCERIN (NITROSTAT) 0.4 MG SL tablet Place 0.4 mg under the tongue every 5 (five) minutes as needed.  For chest pain      . ramipril (ALTACE) 5 MG capsule Take 5 mg by mouth daily.          Allergies  Allergen Reactions  . Meperidine Hcl Other (See Comments)    "CAN'T MOVE" CATATONIC PER PT.    Past Medical History  Diagnosis Date  . CAD (coronary artery disease)     s/p MI 1982;  s/p DES to CFX 2011;  s/p NSTEMI 4/12 in setting of VTach;  cath 7/12:  LM ok, LAD mid 30-40%, Dx's with 40%, mCFX stent patent, prox to mid RCA occluded with R to R and L to R collats.  Medical Tx was continued  . Bradycardia   . Ventricular tachycardia     s/p AICD;   h/o ICD shock;  Amiodarone Rx  . 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 HTN (hypertension)   . HLD (hyperlipidemia)   . Cytopenia   . Hypothyroidism   . Renal insufficiency   .  Claudication   . 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 Chronic systolic heart failure   . Ischemic cardiomyopathy     echo 7/12:  EF 25-30%, mild AI, mod MR, mod LAE  . TIA (transient ischemic attack)   . CVD (cerebrovascular disease)     left CEA 2008  . Arthritis   . Stroke   . Anemia     Past Surgical History  Procedure Date  . Femoral-popliteal bypass graft 2011  . Cardiac defibrillator placement     [employer]tr[employer]  . Revascularization / in-situ graft leg   . Renal artery bypass   . Back surgery   . Lumbar laminectomy   . Knee surgery     bilateral  . Elbow surgery     right  . Cholecystectomy   . Lipoma excision     scalp  . Bone spur     heel  . Cataract extraction     bilateral  . Carotid stent     left  . Carotid endarterectomy   . Doppler echocardiography 2011    History  Smoking status  . Former Smoker  . Types: Cigarettes  . Quit date: 11/05/2001  Smokeless tobacco  . Not on file    History  Alcohol Use: Not on file    Family History  Problem Relation Age of Onset  . Coronary artery  disease    . Heart attack Mother   . Coronary artery disease Mother   . Coronary artery disease Father   . Stroke Father     Review of Systems: As noted in history of present illness. All other systems were reviewed and are negative.  Physical Exam: BP 94/60  Pulse 64  Ht 6' (1.829 m)  Wt 203 lb (92.08 kg)  BMI 27.53 kg/m2 He is a pleasant white male in no acute distress. Normocephalic, atraumatic. Pupils are equal round and reactive to light and accommodation. Extraocular movements are full. Oropharynx is clear. Neck is without JVD, adenopathy, thyromegaly, or bruits. Lungs are clear. Cardiac exam reveals an irregular rhythm without gallop or murmur. Abdomen is soft and nontender. He has bilateral femoral bruits. He has chronic trace to 1+ right lower extremity edema. Pedal pulses are good. He is alert and oriented x3. Cranial nerves II through XII are intact. LABORATORY DATA:   Assessment / Plan:

## 2012-01-25 NOTE — Assessment & Plan Note (Signed)
Status post VT ablation in November of 2012. Continue long-term amiodarone at 400 mg daily.

## 2012-01-25 NOTE — Patient Instructions (Signed)
Continue your current medication  I will see you again in 3 months with lab work

## 2012-01-25 NOTE — Assessment & Plan Note (Signed)
He appears to be well compensated on exam. His blood pressure is relatively low and I don't think we can titrate his medications further. We will continue on his current medications and follow up again in 4 months.

## 2012-02-12 ENCOUNTER — Other Ambulatory Visit: Payer: Medicare Other

## 2012-02-12 ENCOUNTER — Ambulatory Visit: Payer: Medicare Other | Admitting: Vascular Surgery

## 2012-02-19 ENCOUNTER — Ambulatory Visit: Payer: Medicare Other | Admitting: Vascular Surgery

## 2012-02-19 ENCOUNTER — Other Ambulatory Visit: Payer: Medicare Other

## 2012-03-18 ENCOUNTER — Other Ambulatory Visit: Payer: Medicare Other

## 2012-03-24 ENCOUNTER — Encounter: Payer: Self-pay | Admitting: Vascular Surgery

## 2012-03-25 ENCOUNTER — Encounter (INDEPENDENT_AMBULATORY_CARE_PROVIDER_SITE_OTHER): Payer: Medicare Other | Admitting: *Deleted

## 2012-03-25 ENCOUNTER — Ambulatory Visit (INDEPENDENT_AMBULATORY_CARE_PROVIDER_SITE_OTHER): Payer: Medicare Other | Admitting: *Deleted

## 2012-03-25 ENCOUNTER — Ambulatory Visit (INDEPENDENT_AMBULATORY_CARE_PROVIDER_SITE_OTHER): Payer: Medicare Other | Admitting: Vascular Surgery

## 2012-03-25 ENCOUNTER — Other Ambulatory Visit (INDEPENDENT_AMBULATORY_CARE_PROVIDER_SITE_OTHER): Payer: Medicare Other | Admitting: *Deleted

## 2012-03-25 ENCOUNTER — Encounter: Payer: Self-pay | Admitting: Vascular Surgery

## 2012-03-25 VITALS — BP 116/51 | HR 51 | Resp 20 | Ht 72.0 in | Wt 194.0 lb

## 2012-03-25 DIAGNOSIS — Z48812 Encounter for surgical aftercare following surgery on the circulatory system: Secondary | ICD-10-CM

## 2012-03-25 DIAGNOSIS — I739 Peripheral vascular disease, unspecified: Secondary | ICD-10-CM

## 2012-03-25 DIAGNOSIS — I6529 Occlusion and stenosis of unspecified carotid artery: Secondary | ICD-10-CM

## 2012-03-25 NOTE — Progress Notes (Signed)
Subjective:     Patient ID: [name], male   DOB: 1941/10/31, 71 y.o.   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 He had a scan performed 6 months ago which are had a high velocity at the distal anastomosis of his right femoral popliteal bypass graft. This was 340 cm/s. He has had no symptoms of claudication in either leg. He also has had a left femoral-popliteal bypass graft. He is able to walk 3 miles without symptomatology. He denies any neurologic symptoms such as lateralizing weakness, aphasia, amaurosis fugax, diplopia, blurred vision, or syncope.  Past Medical History  Diagnosis Date  . CAD (coronary artery disease)     s/p MI 1982;  s/p DES to CFX 2011;  s/p NSTEMI 4/12 in setting of VTach;  cath 7/12:  LM ok, LAD mid 30-40%, Dx's with 40%, mCFX stent patent, prox to mid RCA occluded with R to R and L to R collats.  Medical Tx was continued  . Bradycardia   . Ventricular tachycardia     s/p AICD;   h/o ICD shock;  Amiodarone Rx  . 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 HTN (hypertension)   . HLD (hyperlipidemia)   . Cytopenia   . Hypothyroidism   . Renal insufficiency   . Claudication   . 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 Chronic systolic heart failure   . Ischemic cardiomyopathy     echo 7/12:  EF 25-30%, mild AI, mod MR, mod LAE  . TIA (transient ischemic attack)   . CVD (cerebrovascular disease)     left CEA 2008  . Arthritis   . Stroke   . Anemia     History  Substance Use Topics  . Smoking status: Former Smoker -- 30 years    Types: Cigarettes    Quit date: 11/05/2001  . Smokeless tobacco: Never Used  . Alcohol Use: Not on file    Family History  Problem Relation Age of Onset  . Coronary artery disease      . Heart attack Mother   . Coronary artery disease Mother   . Coronary artery disease Father   . Stroke Father     Allergies  Allergen Reactions  . Meperidine Hcl Other (See Comments)    "CAN'T MOVE" CATATONIC PER PT.    Current outpatient prescriptions:amiodarone (PACERONE) 400 MG tablet, Take 400 mg by mouth daily. , Disp: , Rfl: ;  amiodarone (PACERONE) 400 MG tablet, take 1 tablet by mouth twice a day, Disp: 60 tablet, Rfl: 6;  aspirin 81 MG tablet, Take 81 mg by mouth daily. , Disp: , Rfl: ;  atorvastatin (LIPITOR) 80 MG tablet, Take 80 mg by mouth daily. , Disp: , Rfl:  Calcium-Magnesium-Zinc 500-250-12.5 MG TABS, Take 2 tablets by mouth daily. , Disp: , Rfl: ;  carvedilol (COREG) 6.25 MG tablet, Take 1 tablet (6.25 mg total) by mouth 2 (two) times daily with a meal., Disp: 60 tablet, Rfl: 11;  clopidogrel (PLAVIX) 75 MG tablet, Take 75 mg by mouth daily. , Disp: , Rfl: ;  furosemide (LASIX) 20 MG tablet, Take 20 mg by mouth daily. , Disp: , Rfl:  levothyroxine (SYNTHROID) 112 MCG tablet,  Take 1 tablet (112 mcg total) by mouth daily., Disp: , Rfl: ;  loteprednol (LOTEMAX) 0.5 % ophthalmic suspension, Place 1 drop into both eyes as directed.  , Disp: , Rfl: ;  Multiple Vitamin (MULTIVITAMIN) tablet, Take 1 tablet by mouth daily. , Disp: , Rfl: ;  Multiple Vitamins-Minerals (EYE VITAMINS PO), Take by mouth 2 (two) times daily.  , Disp: , Rfl:  NIASPAN 500 MG CR tablet, Take 1,500 mg by mouth daily. , Disp: , Rfl: ;  nitroGLYCERIN (NITROSTAT) 0.4 MG SL tablet, Place 0.4 mg under the tongue every 5 (five) minutes as needed. For chest pain, Disp: , Rfl: ;  ramipril (ALTACE) 5 MG capsule, Take 5 mg by mouth daily.  , Disp: , Rfl:   BP 116/51  Pulse 51  Resp 20  Ht 6' (1.829 m)  Wt 194 lb (87.998 kg)  BMI 26.31 kg/m2  Body mass index is 26.31 kg/(m^2).          Review of Systems denies chest pain, dyspnea on exertion, PND, orthopnea, hemoptysis.     Objective:   Physical Exam  blood pressure 116/51 heart rate 51 respirations 20 Gen.-alert and oriented x3 in no apparent distress HEENT normal for age Lungs no rhonchi or wheezing Cardiovascular regular rhythm no murmurs carotid pulses 3+ palpable no bruits audible Abdomen soft nontender no palpable masses Musculoskeletal free of  major deformities Skin clear -no rashes Neurologic normal Lower extremities 3+ femoral and dorsalis pedis pulse on the left and 3+ femoral popliteal and posterior tibial pulse on the right. Both feet well perfused.  Today I ordered lower extremity arterial Dopplers with scans of both femoral-popliteal grafts. ABIs bilaterally are approximately 1.0. There is biphasic flow. Velocity of the distal end of the right femoral popliteal graft is now only 282 cm/s and is normal on the left side. Carotid duplex exam reveals no flow reduction     Assessment:     Doing well post bilateral femoral-popliteal bypass grafts and remote left carotid endarterectomy-08    Plan:     Return in one year with repeat scan of bilateral femoral popliteal graft, ABIs, and carotid duplex exam

## 2012-03-26 NOTE — Progress Notes (Signed)
Addended by: Sharee Pimple on: 03/26/2012 09:24 AM   Modules accepted: Orders

## 2012-04-01 ENCOUNTER — Ambulatory Visit (INDEPENDENT_AMBULATORY_CARE_PROVIDER_SITE_OTHER): Payer: Medicare Other | Admitting: Internal Medicine

## 2012-04-01 ENCOUNTER — Encounter: Payer: Self-pay | Admitting: Internal Medicine

## 2012-04-01 VITALS — BP 108/62 | HR 58 | Ht 72.0 in | Wt 196.8 lb

## 2012-04-01 DIAGNOSIS — I255 Ischemic cardiomyopathy: Secondary | ICD-10-CM

## 2012-04-01 DIAGNOSIS — I5022 Chronic systolic (congestive) heart failure: Secondary | ICD-10-CM

## 2012-04-01 DIAGNOSIS — I2589 Other forms of chronic ischemic heart disease: Secondary | ICD-10-CM

## 2012-04-01 DIAGNOSIS — Z95810 Presence of automatic (implantable) cardiac defibrillator: Secondary | ICD-10-CM

## 2012-04-01 DIAGNOSIS — I472 Ventricular tachycardia: Secondary | ICD-10-CM

## 2012-04-01 LAB — ICD DEVICE OBSERVATION
BATTERY VOLTAGE: 2.66 V
RV LEAD AMPLITUDE: 3.9 mv
RV LEAD IMPEDENCE ICD: 752 Ohm
RV LEAD THRESHOLD: 1 V
TZAT-0004SLOWVT: 8
TZAT-0004SLOWVT: 8
TZAT-0005FASTVT: 88 pct
TZAT-0005SLOWVT: 75 pct
TZAT-0005SLOWVT: 84 pct
TZAT-0011FASTVT: 10 ms
TZAT-0011SLOWVT: 10 ms
TZAT-0012FASTVT: 200 ms
TZAT-0012SLOWVT: 200 ms
TZAT-0012SLOWVT: 200 ms
TZAT-0013FASTVT: 1
TZAT-0013SLOWVT: 6
TZAT-0013SLOWVT: 6
TZAT-0018FASTVT: NEGATIVE
TZAT-0018SLOWVT: NEGATIVE
TZAT-0019FASTVT: 8 V
TZON-0003FASTVT: 280 ms
TZON-0003SLOWVT: 500 ms
TZON-0004SLOWVT: 48
TZON-0005SLOWVT: 16
TZON-0008FASTVT: 0 ms
TZON-0011AFLUTTER: 70
TZST-0001FASTVT: 3
TZST-0001FASTVT: 5
TZST-0001SLOWVT: 4
TZST-0001SLOWVT: 6
TZST-0003FASTVT: 35 J
TZST-0003FASTVT: 35 J
TZST-0003SLOWVT: 30 J
TZST-0003SLOWVT: 35 J
VENTRICULAR PACING ICD: 18.4 pct

## 2012-04-01 NOTE — Assessment & Plan Note (Signed)
Stable continue current medications

## 2012-04-01 NOTE — Patient Instructions (Addendum)
Remote monitoring is used to monitor your Pacemaker of ICD from home. This monitoring reduces the number of office visits required to check your device to one time per year. It allows [place] to keep an eye on the functioning of your device to ensure it is working properly. You are scheduled for a device check from home on July 10, 2012. You may send your transmission at any time that day. If you have a wireless device, the transmission will be sent automatically. After your physician reviews your transmission, you will receive a postcard with your next transmission date.  Your physician wants you to follow-up in: 6 months with Dr Graciela Husbands.  You will receive a reminder letter in the mail two months in advance. If you don't receive a letter, please call our office to schedule the follow-up appointment.  Your physician has recommended you make the following change in your medication:  1) Decrease amiodarone to 400 mg one tablet 5 days a week and 1/2 tablet 2 days a weeks.

## 2012-04-01 NOTE — Assessment & Plan Note (Signed)
No recurrent Ventricular tachycardia-sustained. There have been nonsustained episodes. We will decrease his amiodarone from 400 mg daily to 400 mg times 5D/200 mg times daily per week  Surveillance laboratories were measured in February we will check him again in November

## 2012-04-01 NOTE — Assessment & Plan Note (Signed)
Continue current meds  Consider aldactone

## 2012-04-01 NOTE — Assessment & Plan Note (Signed)
The patient's device was interrogated.  The information was reviewed. No changes were made in the programming.    

## 2012-04-01 NOTE — Progress Notes (Signed)
HPI  [name] is a 71 y.o. male Seen in followup for ventricular tachycardia occurning in the setting of ischemic heart disease with prior revascularization and prior MI He had clinical recurrent VT in the summer 2012 associated with a decrease in his amiodarone dose Which resulted an ICD discharge  Catheterization at that time demonstratedDouble-vessel coronary artery disease with Patent stent in the mid circumflex artery and Chronic total occlusion of the mid right coronary artery that is  Unchanged. Ejection fraction of 25-30% with moderate MR  Scan--large inferior wall infarct with severely depressed LV function and ejection fraction of 24%  Because of recurrent VT followed up titration of amiodarone, he was seen by Dr. Ladona Ridgel and underwent catheter ablation November 2012 . The VT was localized to the scar border on the inferior wall  of the left ventricle about halfway from base to apex.Following ablation, it was no longer inducible. He did have 2 other nonclinical VTs.  The patient denies chest pain, shortness of breath, nocturnal dyspnea, orthopnea or peripheral edema.  There have been no palpitations, lightheadedness or syncope.   Patient denies symptoms of GI intolerance, sun sensitivity, neurological symptoms attributable to amiodarone.  Surveillance laboratories were in normal limits when checked15months ago     Past Medical History  Diagnosis Date  . CAD (coronary artery disease)     s/p MI 1982;  s/p DES to CFX 2011;  s/p NSTEMI 4/12 in setting of VTach;  cath 7/12:  LM ok, LAD mid 30-40%, Dx's with 40%, mCFX stent patent, prox to mid RCA occluded with R to R and L to R collats.  Medical Tx was continued  . Bradycardia   . Ventricular tachycardia     s/p AICD;   h/o ICD shock;  Amiodarone Rx  . 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 HTN  (hypertension)   . HLD (hyperlipidemia)   . Cytopenia   . Hypothyroidism   . Renal insufficiency   . Claudication   . 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 Chronic systolic heart failure   . Ischemic cardiomyopathy     echo 7/12:  EF 25-30%, mild AI, mod MR, mod LAE  . TIA (transient ischemic attack)   . CVD (cerebrovascular disease)     left CEA 2008  . Arthritis   . Stroke   . Anemia     Past Surgical History  Procedure Date  . Femoral-popliteal bypass graft 2011  . Cardiac defibrillator placement     [employer]  . Revascularization / in-situ graft leg   . Renal artery bypass   . Back surgery   . Lumbar laminectomy   . Knee surgery     bilateral  . Elbow surgery     right  . Cholecystectomy   . Lipoma excision     scalp  . Bone spur     heel  . Cataract extraction     bilateral  . Carotid stent     left  . Carotid endarterectomy   . Doppler echocardiography 2011    Current Outpatient Prescriptions  Medication Sig Dispense Refill  . amiodarone (PACERONE) 400 MG tablet Take 400 mg by mouth daily.       [name] aspirin 81 MG tablet Take 81 mg by mouth daily.       [name]  atorvastatin (LIPITOR) 80 MG tablet Take 80 mg by mouth daily.       . Calcium-Magnesium-Zinc 500-250-12.5 MG TABS Take 2 tablets by mouth daily.       . carvedilol (COREG) 6.25 MG tablet Take 1 tablet (6.25 mg total) by mouth 2 (two) times daily with a meal.  60 tablet  11  . clopidogrel (PLAVIX) 75 MG tablet Take 75 mg by mouth daily.       . furosemide (LASIX) 20 MG tablet Take 20 mg by mouth daily.       [name] levothyroxine (SYNTHROID) 112 MCG tablet Take 1 tablet (112 mcg total) by mouth daily.      [name] loteprednol (LOTEMAX) 0.5 % ophthalmic suspension Place 1 drop into both eyes as directed.        . Multiple Vitamin (MULTIVITAMIN) tablet Take 1 tablet by mouth daily.       . Multiple Vitamins-Minerals (EYE VITAMINS PO) Take by mouth 2 (two) times daily.        [name] NIASPAN 500 MG CR tablet Take 1,500 mg  by mouth daily.       . nitroGLYCERIN (NITROSTAT) 0.4 MG SL tablet Place 0.4 mg under the tongue every 5 (five) minutes as needed. For chest pain      . ramipril (ALTACE) 5 MG capsule Take 5 mg by mouth daily.        [name] DISCONTD: amiodarone (PACERONE) 400 MG tablet take 1 tablet by mouth twice a day  60 tablet  6    Allergies  Allergen Reactions  . Meperidine Hcl Other (See Comments)    "CAN'T MOVE" CATATONIC PER PT.    Review of Systems negative except from HPI and PMH  Physical Exam BP 108/62  Pulse 58  Ht 6' (1.829 m)  Wt 196 lb 12.8 oz (89.268 kg)  BMI 26.69 kg/m2 Well developed and well nourished in no acute distress HENT normal E scleral and icterus clear Neck Supple JVP flat; carotids brisk and full Clear to ausculation Regular rate and rhythm, no murmurs gallops or rub Soft with active bowel sounds No clubbing cyanosis none Edema Alert and oriented, grossly normal motor and sensory function Skin Warm and Dry    Assessment and  Plan

## 2012-04-07 NOTE — Procedures (Unsigned)
CAROTID DUPLEX EXAM  INDICATION:  Left carotid endarterectomy  HISTORY: Diabetes:  No Cardiac:  Yes Hypertension:  Yes Smoking:  Previous Previous Surgery:  Left carotid endarterectomy in 2008 CV History:  Currently asymptomatic Amaurosis Fugax No, Paresthesias No, Hemiparesis No                                      RIGHT             LEFT Brachial systolic pressure:         103               96 Brachial Doppler waveforms:         Normal            Normal Vertebral direction of flow:        Antegrade         Antegrade DUPLEX VELOCITIES (cm/sec) CCA peak systolic                   50                52 ECA peak systolic                   90                49 ICA peak systolic                   95                63 ICA end diastolic                   25                15 PLAQUE MORPHOLOGY:                  Mixed             Heterogeneous PLAQUE AMOUNT:                      Mild              Mild PLAQUE LOCATION:                    ICA / ECA         CCA  IMPRESSION: 1. Patent left carotid endarterectomy site with no hemodynamically     significant stenoses at the bilateral internal carotid arteries.     Plaque formations as described above. 2. No significant change in the Doppler velocities noted when compared     to the previous exam on 01/09/2011.  ___________________________________________ Quita Skye. Hart [place], M.D.  [facility]/MEDQ  D:  03/28/2012  T:  03/28/2012  Job:  [id number]

## 2012-04-07 NOTE — Procedures (Unsigned)
BYPASS GRAFT EVALUATION  INDICATION:  Peripheral artery disease  HISTORY: Diabetes:  No Cardiac:  Yes Hypertension:  Yes Smoking:  Previous Previous Surgery:  Aortobifemoral, bilateral aorto to renal and left fem- pop bypass grafts in 2003; right fem-pop bypass graft on 01/13/2010  SINGLE LEVEL ARTERIAL EXAM                              RIGHT              LEFT Brachial: Anterior tibial: Posterior tibial: Peroneal: Ankle/brachial index:  PREVIOUS ABI:  Date:  RIGHT  LEFT:  LOWER EXTREMITY BYPASS GRAFT DUPLEX EXAM:  DUPLEX:  Patent distal limbs of the aortobifemoral bypass graft. Biphasic Doppler waveforms noted throughout the bilateral lower extremity bypass grafts with a velocity of 282 cm/s noted near the distal anastomosis of the right fem-pop bypass graft.  IMPRESSION: 1. Patent bilateral femoral-popliteal bypass grafts noted with     elevated velocity as described above. 2. Bilateral ankle brachial indices are noted on a separate report.  ___________________________________________ Quita Skye. Hart Rochester, M.D.  CH/MEDQ  D:  03/28/2012  T:  03/28/2012  Job:  [id number]

## 2012-04-30 ENCOUNTER — Encounter: Payer: Self-pay | Admitting: Cardiology

## 2012-04-30 ENCOUNTER — Ambulatory Visit (INDEPENDENT_AMBULATORY_CARE_PROVIDER_SITE_OTHER): Payer: Medicare Other | Admitting: Cardiology

## 2012-04-30 ENCOUNTER — Other Ambulatory Visit (INDEPENDENT_AMBULATORY_CARE_PROVIDER_SITE_OTHER): Payer: Medicare Other

## 2012-04-30 ENCOUNTER — Other Ambulatory Visit: Payer: Self-pay

## 2012-04-30 VITALS — BP 112/67 | HR 52 | Ht 72.0 in | Wt 199.0 lb

## 2012-04-30 DIAGNOSIS — I2510 Atherosclerotic heart disease of native coronary artery without angina pectoris: Secondary | ICD-10-CM

## 2012-04-30 DIAGNOSIS — I472 Ventricular tachycardia: Secondary | ICD-10-CM

## 2012-04-30 DIAGNOSIS — I509 Heart failure, unspecified: Secondary | ICD-10-CM

## 2012-04-30 DIAGNOSIS — I5022 Chronic systolic (congestive) heart failure: Secondary | ICD-10-CM

## 2012-04-30 DIAGNOSIS — E785 Hyperlipidemia, unspecified: Secondary | ICD-10-CM

## 2012-04-30 DIAGNOSIS — I739 Peripheral vascular disease, unspecified: Secondary | ICD-10-CM

## 2012-04-30 DIAGNOSIS — I6529 Occlusion and stenosis of unspecified carotid artery: Secondary | ICD-10-CM

## 2012-04-30 LAB — HEPATIC FUNCTION PANEL
ALT: 43 U/L (ref 0–53)
Bilirubin, Direct: 0.1 mg/dL (ref 0.0–0.3)
Total Bilirubin: 0.9 mg/dL (ref 0.3–1.2)

## 2012-04-30 LAB — BASIC METABOLIC PANEL
CO2: 27 mEq/L (ref 19–32)
Chloride: 106 mEq/L (ref 96–112)
Creatinine, Ser: 1.7 mg/dL — ABNORMAL HIGH (ref 0.4–1.5)

## 2012-04-30 NOTE — Assessment & Plan Note (Signed)
He will continue with Lipitor and niacin therapy. We'll check fasting lab work and 4 months.

## 2012-04-30 NOTE — Progress Notes (Signed)
[name] Date of Birth: 1941/09/18   History of Present Illness: [name] is seen for followup today. He reports he is doing very well. He has had no recurrent ventricular tachycardia. Dr. Graciela Husbands has decreased his amiodarone dose. He has lost 4 pounds since his last visit. He states he is walking more at work. He has no significant chest pain or shortness of breath. He's had no claudication symptoms. He denies any defibrillator discharges. He has had no edema or increased orthopnea.  Current Outpatient Prescriptions on File Prior to Visit  Medication Sig Dispense Refill  . amiodarone (PACERONE) 400 MG tablet Take one tablet 5 days a week and 1/2 tablet 2 days a week      . aspirin 81 MG tablet Take 81 mg by mouth daily.       [name] atorvastatin (LIPITOR) 80 MG tablet Take 80 mg by mouth daily.       . Calcium-Magnesium-Zinc 500-250-12.5 MG TABS Take 2 tablets by mouth daily.       . carvedilol (COREG) 6.25 MG tablet Take 1 tablet (6.25 mg total) by mouth 2 (two) times daily with a meal.  60 tablet  11  . clopidogrel (PLAVIX) 75 MG tablet Take 75 mg by mouth daily.       . furosemide (LASIX) 20 MG tablet Take 20 mg by mouth daily.       [name] levothyroxine (SYNTHROID) 112 MCG tablet Take 1 tablet (112 mcg total) by mouth daily.      [name] loteprednol (LOTEMAX) 0.5 % ophthalmic suspension Place 1 drop into both eyes as directed.        . Multiple Vitamin (MULTIVITAMIN) tablet Take 1 tablet by mouth daily.       . Multiple Vitamins-Minerals (EYE VITAMINS PO) Take by mouth 2 (two) times daily.        [name] NIASPAN 500 MG CR tablet Take 1,500 mg by mouth daily.       . nitroGLYCERIN (NITROSTAT) 0.4 MG SL tablet Place 0.4 mg under the tongue every 5 (five) minutes as needed. For chest pain      . ramipril (ALTACE) 5 MG capsule Take 5 mg by mouth daily.          Allergies  Allergen Reactions  . Meperidine Hcl Other (See Comments)    "CAN'T MOVE" CATATONIC PER PT.    Past Medical History    Diagnosis Date  . CAD (coronary artery disease)     s/p MI 1982;  s/p DES to CFX 2011;  s/p NSTEMI 4/12 in setting of VTach;  cath 7/12:  LM ok, LAD mid 30-40%, Dx's with 40%, mCFX stent patent, prox to mid RCA occluded with R to R and L to R collats.  Medical Tx was continued  . Bradycardia   . Ventricular tachycardia     s/p AICD;   h/o ICD shock;  Amiodarone Rx  . 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 HTN (hypertension)   . HLD (hyperlipidemia)   . Cytopenia   . Hypothyroidism   . Renal insufficiency   . Claudication   . 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 Chronic systolic heart failure   . Ischemic cardiomyopathy     echo 7/12:  EF 25-30%, mild AI,  mod MR, mod LAE  . TIA (transient ischemic attack)   . CVD (cerebrovascular disease)     left CEA 2008  . Arthritis   . Stroke   . Anemia     Past Surgical History  Procedure Date  . Femoral-popliteal bypass graft 2011  . Cardiac defibrillator placement     [employer]  . Revascularization / in-situ graft leg   . Renal artery bypass   . Back surgery   . Lumbar laminectomy   . Knee surgery     bilateral  . Elbow surgery     right  . Cholecystectomy   . Lipoma excision     scalp  . Bone spur     heel  . Cataract extraction     bilateral  . Carotid stent     left  . Carotid endarterectomy   . Doppler echocardiography 2011    History  Smoking status  . Former Smoker -- 30 years  . Types: Cigarettes  . Quit date: 11/05/2001  Smokeless tobacco  . Never Used    History  Alcohol Use: Not on file    Family History  Problem Relation Age of Onset  . Coronary artery disease    . Heart attack Mother   . Coronary artery disease Mother   . Coronary artery disease Father   . Stroke Father     Review of Systems: As noted in history of present illness. All other systems  were reviewed and are negative.  Physical Exam: BP 112/67  Pulse 52  Ht 6' (1.829 m)  Wt 199 lb (90.266 kg)  BMI 26.99 kg/m2 He is a pleasant white male in no acute distress. Normocephalic, atraumatic. Pupils are equal round and reactive to light and accommodation. Extraocular movements are full. Oropharynx is clear. Neck is without JVD, adenopathy, thyromegaly, or bruits. Lungs are clear. Cardiac exam reveals an irregular rhythm without gallop or murmur. Abdomen is soft and nontender. He has bilateral femoral bruits. He has chronic trace to trace right lower extremity edema. Pedal pulses are good. He is alert and oriented x3. Cranial nerves II through XII are intact. LABORATORY DATA: Complete chemistry panel and TSH are pending today.  Assessment / Plan:

## 2012-04-30 NOTE — Assessment & Plan Note (Signed)
He is status post ablation of ventricular tachycardia and November. He has had no recurrence. Amiodarone dose is being reduced. We will follow up liver function studies and TSH today.

## 2012-04-30 NOTE — Assessment & Plan Note (Signed)
He is euvolemic on current therapy. His carvedilol dose is optimal based on his heart rate. We discussed potential increase in his Altace therapy or the addition of Aldactone. However, he has chronic stage III renal insufficiency with last creatinine of 2.1 and on concerned about adding these additional medications. We will followup on his lab work today. I will see him in 4 months for followup.

## 2012-04-30 NOTE — Patient Instructions (Signed)
Continue your current therapy  We will call with the results of your lab work  I will see you again in 4 months.

## 2012-04-30 NOTE — Assessment & Plan Note (Signed)
He has no significant claudication symptoms at this time. He is followed by Dr. Hart Rochester.

## 2012-04-30 NOTE — Assessment & Plan Note (Signed)
He is status post remote myocardial infarction in 1982. He had stenting of the left circumflex coronary in 2011 with a DES. Cardiac catheterization in July of 2012 showed that the stent was patent. 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 He is asymptomatic at this point. Continue his current antianginal therapy.

## 2012-05-05 LAB — ICD DEVICE OBSERVATION

## 2012-05-30 ENCOUNTER — Telehealth: Payer: Self-pay | Admitting: Internal Medicine

## 2012-05-30 NOTE — Telephone Encounter (Signed)
Form filled out by device nurse and faxed back.

## 2012-05-30 NOTE — Telephone Encounter (Signed)
New problem;  Per Cassy  need information regarding pacer/ defib. Surgery on 7/30.

## 2012-05-30 NOTE — Telephone Encounter (Signed)
Pt is scheduled for surgery on Tuesday and they need information regarding interrogation of pt's device during surgery.  Information sheet being faxed which needs to be filled out and returned today.

## 2012-06-09 ENCOUNTER — Telehealth: Payer: Self-pay | Admitting: *Deleted

## 2012-06-09 NOTE — Telephone Encounter (Signed)
I spoke with the patient. I made him aware that Dr. Graciela Husbands had received records stating the patient was hospitalized in early July for an episode of syncope at work. ER diagnosis was dehydration. Per the message Dr. Graciela Husbands received on faxed records, the patient's PCP, Dr. Ronne Binning did not completely concur with the diagnosis and wanted Dr. Odessa Fleming opinion based on the records sent. Per Dr. Graciela Husbands, no arrhythmias noted. I called the patient and advised him of this. He states he's been doing "fantastic" since his episode. I explained we did not need to see him unless he had recurrent syncope. He is due for a remote check from home on his device on 07/10/12. I explained we will review the report at the time. He is agreeable.

## 2012-06-23 ENCOUNTER — Encounter: Payer: Self-pay | Admitting: Internal Medicine

## 2012-07-10 ENCOUNTER — Ambulatory Visit (INDEPENDENT_AMBULATORY_CARE_PROVIDER_SITE_OTHER): Payer: Medicare Other | Admitting: *Deleted

## 2012-07-10 ENCOUNTER — Encounter: Payer: Self-pay | Admitting: Internal Medicine

## 2012-07-10 DIAGNOSIS — I255 Ischemic cardiomyopathy: Secondary | ICD-10-CM

## 2012-07-10 DIAGNOSIS — I2589 Other forms of chronic ischemic heart disease: Secondary | ICD-10-CM

## 2012-07-15 LAB — REMOTE ICD DEVICE
BRDY-0002RV: 50 {beats}/min
RV LEAD AMPLITUDE: 4.9 mv
RV LEAD IMPEDENCE ICD: 704 Ohm
TZAT-0001FASTVT: 1
TZAT-0011FASTVT: 10 ms
TZAT-0011SLOWVT: 10 ms
TZAT-0011SLOWVT: 10 ms
TZAT-0012SLOWVT: 200 ms
TZAT-0012SLOWVT: 200 ms
TZAT-0013SLOWVT: 6
TZAT-0018SLOWVT: NEGATIVE
TZAT-0019FASTVT: 8 V
TZAT-0019SLOWVT: 8 V
TZAT-0019SLOWVT: 8 V
TZAT-0020FASTVT: 1.6 ms
TZON-0003SLOWVT: 500 ms
TZON-0004SLOWVT: 48
TZON-0005SLOWVT: 16
TZON-0008FASTVT: 0 ms
TZON-0008SLOWVT: 0 ms
TZON-0011AFLUTTER: 70
TZST-0001FASTVT: 2
TZST-0001FASTVT: 5
TZST-0001SLOWVT: 4
TZST-0003FASTVT: 35 J
TZST-0003FASTVT: 35 J
TZST-0003SLOWVT: 20 J
TZST-0003SLOWVT: 35 J

## 2012-08-13 ENCOUNTER — Telehealth: Payer: Self-pay | Admitting: Cardiology

## 2012-08-13 NOTE — Telephone Encounter (Signed)
Spoke to Phyliss was told will check with Dr.Jordan 08/15/12 and call her back.

## 2012-08-13 NOTE — Telephone Encounter (Signed)
New problem;  Need to stop plavix 5 day  Prior. Upcoming colonoscopy on 11/14.

## 2012-08-15 NOTE — Telephone Encounter (Signed)
Left message on Phyllis's voice mail and patient's voice mail ok with Dr.Jordan to hold plavix 5 days before colonoscopy.

## 2012-08-19 ENCOUNTER — Encounter: Payer: Self-pay | Admitting: *Deleted

## 2012-08-29 ENCOUNTER — Encounter: Payer: Self-pay | Admitting: Cardiology

## 2012-08-29 ENCOUNTER — Ambulatory Visit (INDEPENDENT_AMBULATORY_CARE_PROVIDER_SITE_OTHER): Payer: Medicare Other | Admitting: Cardiology

## 2012-08-29 VITALS — BP 118/60 | HR 51 | Ht 72.0 in | Wt 198.8 lb

## 2012-08-29 DIAGNOSIS — I472 Ventricular tachycardia: Secondary | ICD-10-CM

## 2012-08-29 DIAGNOSIS — I10 Essential (primary) hypertension: Secondary | ICD-10-CM

## 2012-08-29 DIAGNOSIS — I255 Ischemic cardiomyopathy: Secondary | ICD-10-CM

## 2012-08-29 DIAGNOSIS — I2510 Atherosclerotic heart disease of native coronary artery without angina pectoris: Secondary | ICD-10-CM

## 2012-08-29 DIAGNOSIS — I5022 Chronic systolic (congestive) heart failure: Secondary | ICD-10-CM

## 2012-08-29 DIAGNOSIS — I2589 Other forms of chronic ischemic heart disease: Secondary | ICD-10-CM

## 2012-08-29 NOTE — Patient Instructions (Signed)
Continue your current medication.  I will see you again in 6 months.   

## 2012-08-30 NOTE — Progress Notes (Signed)
Remigio Eisenmenger Date of Birth: 1941-07-17   History of Present Illness: [name] is seen for followup today. He reports he is doing very well. He has had no recurrent ventricular tachycardia. He continues to work. He denies any chest pain or shortness of breath. He has no increase in edema and his weight has been stable. He reports he is scheduled for colonoscopy on November 14.  Current Outpatient Prescriptions on File Prior to Visit  Medication Sig Dispense Refill  . amiodarone (PACERONE) 400 MG tablet Take one tablet 5 days a week and 1/2 tablet 2 days a week      . aspirin 81 MG tablet Take 81 mg by mouth daily.       [name] atorvastatin (LIPITOR) 80 MG tablet Take 80 mg by mouth daily.       . carvedilol (COREG) 6.25 MG tablet Take 1 tablet (6.25 mg total) by mouth 2 (two) times daily with a meal.  60 tablet  11  . clopidogrel (PLAVIX) 75 MG tablet Take 75 mg by mouth daily.       . furosemide (LASIX) 20 MG tablet Take 20 mg by mouth daily.       [name] levothyroxine (SYNTHROID) 112 MCG tablet Take 1 tablet (112 mcg total) by mouth daily.      . Multiple Vitamin (MULTIVITAMIN) tablet Take 1 tablet by mouth daily.       . Multiple Vitamins-Minerals (EYE VITAMINS PO) Take by mouth 2 (two) times daily.        [name] NIASPAN 500 MG CR tablet Take 1,500 mg by mouth daily.       . nitroGLYCERIN (NITROSTAT) 0.4 MG SL tablet Place 0.4 mg under the tongue every 5 (five) minutes as needed. For chest pain      . ramipril (ALTACE) 5 MG capsule Take 5 mg by mouth daily.          Allergies  Allergen Reactions  . Meperidine Hcl Other (See Comments)    "CAN'T MOVE" CATATONIC PER PT.    Past Medical History  Diagnosis Date  . CAD (coronary artery disease)     s/p MI 1982;  s/p DES to CFX 2011;  s/p NSTEMI 4/12 in setting of VTach;  cath 7/12:  LM ok, LAD mid 30-40%, Dx's with 40%, mCFX stent patent, prox to mid RCA occluded with R to R and L to R collats.  Medical Tx was continued  . Bradycardia   .  Ventricular tachycardia     s/p AICD;   h/o ICD shock;  Amiodarone Rx  . 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 HTN (hypertension)   . HLD (hyperlipidemia)   . Cytopenia   . Hypothyroidism   . Renal insufficiency   . Claudication   . 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 Chronic systolic heart failure   . Ischemic cardiomyopathy     echo 7/12:  EF 25-30%, mild AI, mod MR, mod LAE  . TIA (transient ischemic attack)   . CVD (cerebrovascular disease)     left CEA 2008  . Arthritis   . Stroke   . Anemia     Past Surgical History  Procedure Date  . Femoral-popliteal bypass graft 2011  . Cardiac defibrillator placement  [employer]  . Revascularization / in-situ graft leg   . Renal artery bypass   . Back surgery   . Lumbar laminectomy   . Knee surgery     bilateral  . Elbow surgery     right  . Cholecystectomy   . Lipoma excision     scalp  . Bone spur     heel  . Cataract extraction     bilateral  . Carotid stent     left  . Carotid endarterectomy   . Doppler echocardiography 2011    History  Smoking status  . Former Smoker -- 30 years  . Types: Cigarettes  . Quit date: 11/05/2001  Smokeless tobacco  . Never Used    History  Alcohol Use: Not on file    Family History  Problem Relation Age of Onset  . Coronary artery disease    . Heart attack Mother   . Coronary artery disease Mother   . Coronary artery disease Father   . Stroke Father     Review of Systems: As noted in history of present illness. All other systems were reviewed and are negative.  Physical Exam: BP 118/60  Pulse 51  Ht 6' (1.829 m)  Wt 198 lb 12.8 oz (90.175 kg)  BMI 26.96 kg/m2  SpO2 98% He is a pleasant white male in no acute distress. Normocephalic, atraumatic. Pupils are equal round and reactive to light and accommodation. Extraocular  movements are full. Oropharynx is clear. Neck is without JVD, adenopathy, thyromegaly, or bruits. Lungs are clear. Cardiac exam reveals an irregular rhythm without gallop or murmur. Abdomen is soft and nontender. He has bilateral femoral bruits. He has chronic trace right lower extremity edema. Pedal pulses are good. He is alert and oriented x3. Cranial nerves II through XII are intact. LABORATORY DATA: Laboratory data from his primary care demonstrates a platelet count of 114,000. BUN was 22 with a creatinine of 1.52. Total cholesterol 129, triglycerides 56, HDL 66, LDL 52. All other findings on CBC, chemistries, and TSH were all normal.  Assessment / Plan: 1. Chronic systolic CHF. Well compensated on exam. Continue current doses of Altace and carvedilol. Up titration limited by hypotension in the past.  2. Ventricular tachycardia status post ICD implant. He is on chronic amiodarone therapy. Thyroid and liver function studies have been normal.  3. PAD was stable claudication.  4. CAD with remote myocardial infarction 1982. Status post stenting of the left circumflex coronary in 2011 with DES. Cardiac catheterization 2012 showed patent stent. He has a chronic occlusion of the right coronary with collaterals. He is asymptomatic on medical therapy.

## 2012-10-20 ENCOUNTER — Encounter: Payer: Self-pay | Admitting: Internal Medicine

## 2012-10-20 ENCOUNTER — Ambulatory Visit (INDEPENDENT_AMBULATORY_CARE_PROVIDER_SITE_OTHER): Payer: Medicare Other | Admitting: *Deleted

## 2012-10-20 DIAGNOSIS — Z95810 Presence of automatic (implantable) cardiac defibrillator: Secondary | ICD-10-CM

## 2012-10-20 DIAGNOSIS — I255 Ischemic cardiomyopathy: Secondary | ICD-10-CM

## 2012-10-20 DIAGNOSIS — I2589 Other forms of chronic ischemic heart disease: Secondary | ICD-10-CM

## 2012-10-25 ENCOUNTER — Encounter: Payer: Self-pay | Admitting: Internal Medicine

## 2012-11-02 LAB — REMOTE ICD DEVICE
BATTERY VOLTAGE: 2.64 V
CHARGE TIME: 10.19 s
PACEART VT: 1
RV LEAD AMPLITUDE: 3.8 mv
TZAT-0001SLOWVT: 2
TZAT-0004FASTVT: 8
TZAT-0004SLOWVT: 8
TZAT-0004SLOWVT: 8
TZAT-0005FASTVT: 88 pct
TZAT-0005SLOWVT: 75 pct
TZAT-0005SLOWVT: 84 pct
TZAT-0011FASTVT: 10 ms
TZAT-0011SLOWVT: 10 ms
TZAT-0011SLOWVT: 10 ms
TZAT-0012FASTVT: 200 ms
TZAT-0013SLOWVT: 6
TZAT-0018SLOWVT: NEGATIVE
TZAT-0018SLOWVT: NEGATIVE
TZAT-0019FASTVT: 8 V
TZON-0003FASTVT: 280 ms
TZON-0004SLOWVT: 48
TZON-0005SLOWVT: 16
TZON-0008FASTVT: 0 ms
TZON-0011AFLUTTER: 70
TZST-0001FASTVT: 3
TZST-0001FASTVT: 5
TZST-0001SLOWVT: 3
TZST-0001SLOWVT: 4
TZST-0001SLOWVT: 6
TZST-0003FASTVT: 30 J
TZST-0003FASTVT: 35 J
TZST-0003FASTVT: 35 J
TZST-0003SLOWVT: 35 J

## 2012-11-11 ENCOUNTER — Encounter: Payer: Self-pay | Admitting: *Deleted

## 2012-11-23 ENCOUNTER — Telehealth: Payer: Self-pay | Admitting: Physician Assistant

## 2012-11-23 ENCOUNTER — Emergency Department (HOSPITAL_COMMUNITY): Payer: Medicare Other

## 2012-11-23 ENCOUNTER — Encounter (HOSPITAL_COMMUNITY): Payer: Self-pay | Admitting: Family Medicine

## 2012-11-23 ENCOUNTER — Inpatient Hospital Stay (HOSPITAL_COMMUNITY)
Admission: EM | Admit: 2012-11-23 | Discharge: 2012-11-25 | DRG: 287 | Disposition: A | Discharge status: Home / Self Care | Payer: Medicare Other | Attending: Cardiovascular Disease | Admitting: Cardiovascular Disease

## 2012-11-23 DIAGNOSIS — E039 Hypothyroidism, unspecified: Secondary | ICD-10-CM | POA: Diagnosis present

## 2012-11-23 DIAGNOSIS — I5022 Chronic systolic (congestive) heart failure: Secondary | ICD-10-CM | POA: Diagnosis present

## 2012-11-23 DIAGNOSIS — Z9889 Other specified postprocedural states: Secondary | ICD-10-CM

## 2012-11-23 DIAGNOSIS — E785 Hyperlipidemia, unspecified: Secondary | ICD-10-CM | POA: Diagnosis present

## 2012-11-23 DIAGNOSIS — I472 Ventricular tachycardia, unspecified: Secondary | ICD-10-CM | POA: Diagnosis present

## 2012-11-23 DIAGNOSIS — I509 Heart failure, unspecified: Secondary | ICD-10-CM | POA: Diagnosis present

## 2012-11-23 DIAGNOSIS — Z87891 Personal history of nicotine dependence: Secondary | ICD-10-CM

## 2012-11-23 DIAGNOSIS — Z8249 Family history of ischemic heart disease and other diseases of the circulatory system: Secondary | ICD-10-CM

## 2012-11-23 DIAGNOSIS — R079 Chest pain, unspecified: Secondary | ICD-10-CM

## 2012-11-23 DIAGNOSIS — I6529 Occlusion and stenosis of unspecified carotid artery: Secondary | ICD-10-CM | POA: Diagnosis present

## 2012-11-23 DIAGNOSIS — Z95810 Presence of automatic (implantable) cardiac defibrillator: Secondary | ICD-10-CM | POA: Diagnosis present

## 2012-11-23 DIAGNOSIS — Z7982 Long term (current) use of aspirin: Secondary | ICD-10-CM

## 2012-11-23 DIAGNOSIS — I200 Unstable angina: Secondary | ICD-10-CM | POA: Diagnosis present

## 2012-11-23 DIAGNOSIS — I4729 Other ventricular tachycardia: Secondary | ICD-10-CM | POA: Diagnosis present

## 2012-11-23 DIAGNOSIS — I255 Ischemic cardiomyopathy: Secondary | ICD-10-CM

## 2012-11-23 DIAGNOSIS — I10 Essential (primary) hypertension: Secondary | ICD-10-CM | POA: Diagnosis present

## 2012-11-23 DIAGNOSIS — N259 Disorder resulting from impaired renal tubular function, unspecified: Secondary | ICD-10-CM | POA: Diagnosis present

## 2012-11-23 DIAGNOSIS — Z79899 Other long term (current) drug therapy: Secondary | ICD-10-CM

## 2012-11-23 DIAGNOSIS — I739 Peripheral vascular disease, unspecified: Secondary | ICD-10-CM | POA: Diagnosis present

## 2012-11-23 DIAGNOSIS — I252 Old myocardial infarction: Secondary | ICD-10-CM

## 2012-11-23 DIAGNOSIS — I2589 Other forms of chronic ischemic heart disease: Secondary | ICD-10-CM | POA: Diagnosis present

## 2012-11-23 DIAGNOSIS — I2510 Atherosclerotic heart disease of native coronary artery without angina pectoris: Principal | ICD-10-CM | POA: Diagnosis present

## 2012-11-23 DIAGNOSIS — Z8673 Personal history of transient ischemic attack (TIA), and cerebral infarction without residual deficits: Secondary | ICD-10-CM

## 2012-11-23 LAB — CBC WITH DIFFERENTIAL/PLATELET
Basophils Absolute: 0.1 10*3/uL (ref 0.0–0.1)
Basophils Relative: 1 % (ref 0–1)
HCT: 40.2 % (ref 39.0–52.0)
Hemoglobin: 13.6 g/dL (ref 13.0–17.0)
Lymphocytes Relative: 11 % — ABNORMAL LOW (ref 12–46)
Monocytes Absolute: 0.8 10*3/uL (ref 0.1–1.0)
Monocytes Relative: 11 % (ref 3–12)
Neutro Abs: 5.5 10*3/uL (ref 1.7–7.7)
Neutrophils Relative %: 73 % (ref 43–77)
RBC: 4.01 MIL/uL — ABNORMAL LOW (ref 4.22–5.81)
WBC: 7.6 10*3/uL (ref 4.0–10.5)

## 2012-11-23 LAB — HEPARIN LEVEL (UNFRACTIONATED): Heparin Unfractionated: 0.34 IU/mL (ref 0.30–0.70)

## 2012-11-23 LAB — COMPREHENSIVE METABOLIC PANEL
AST: 47 U/L — ABNORMAL HIGH (ref 0–37)
Albumin: 3.7 g/dL (ref 3.5–5.2)
Alkaline Phosphatase: 139 U/L — ABNORMAL HIGH (ref 39–117)
BUN: 24 mg/dL — ABNORMAL HIGH (ref 6–23)
CO2: 23 mEq/L (ref 19–32)
Chloride: 102 mEq/L (ref 96–112)
Creatinine, Ser: 1.46 mg/dL — ABNORMAL HIGH (ref 0.50–1.35)
GFR calc non Af Amer: 46 mL/min — ABNORMAL LOW (ref >90)
Potassium: 4.4 mEq/L (ref 3.5–5.1)
Total Bilirubin: 0.6 mg/dL (ref 0.3–1.2)

## 2012-11-23 LAB — PROTIME-INR: Prothrombin Time: 15 seconds (ref 11.6–15.2)

## 2012-11-23 LAB — TROPONIN I: Troponin I: <0.3 ng/mL (ref <0.30)

## 2012-11-23 LAB — POCT I-STAT TROPONIN I: Troponin i, poc: 0.03 ng/mL (ref 0.00–0.08)

## 2012-11-23 LAB — MRSA PCR SCREENING: MRSA by PCR: NEGATIVE

## 2012-11-23 MED ORDER — ZOLPIDEM TARTRATE 5 MG PO TABS: 5.0000 mg | ORAL_TABLET | Freq: Every evening | ORAL | Status: DC | PRN

## 2012-11-23 MED ORDER — ASPIRIN 81 MG PO CHEW
162.0000 mg | CHEWABLE_TABLET | Freq: Once | ORAL | Status: AC
  Administered 2012-11-23: 162 mg via ORAL
  Filled 2012-11-23: qty 2

## 2012-11-23 MED ORDER — ASPIRIN 81 MG PO CHEW
324.0000 mg | CHEWABLE_TABLET | ORAL | Status: AC
  Administered 2012-11-24: 324 mg via ORAL
  Filled 2012-11-23: qty 4

## 2012-11-23 MED ORDER — NITROGLYCERIN 0.4 MG SL SUBL: 0.4000 mg | SUBLINGUAL_TABLET | SUBLINGUAL | Status: DC | PRN

## 2012-11-23 MED ORDER — SODIUM CHLORIDE 0.9 % IV SOLN: 250.0000 mL | INTRAVENOUS | Status: DC | PRN

## 2012-11-23 MED ORDER — SODIUM CHLORIDE 0.9 % IJ SOLN: 3.0000 mL | INTRAMUSCULAR | Status: DC | PRN

## 2012-11-23 MED ORDER — DIAZEPAM 5 MG PO TABS
5.0000 mg | ORAL_TABLET | ORAL | Status: AC
  Administered 2012-11-24: 5 mg via ORAL
  Filled 2012-11-23: qty 1

## 2012-11-23 MED ORDER — SODIUM CHLORIDE 0.9 % IV SOLN
INTRAVENOUS | Status: DC
  Administered 2012-11-24: 50 mL/h via INTRAVENOUS

## 2012-11-23 MED ORDER — HEPARIN BOLUS VIA INFUSION
4000.0000 [IU] | Freq: Once | INTRAVENOUS | Status: AC
  Administered 2012-11-23: 4000 [IU] via INTRAVENOUS

## 2012-11-23 MED ORDER — ALPRAZOLAM 0.25 MG PO TABS: 0.2500 mg | ORAL_TABLET | Freq: Two times a day (BID) | ORAL | Status: DC | PRN

## 2012-11-23 MED ORDER — ACETAMINOPHEN 325 MG PO TABS: 650.0000 mg | ORAL_TABLET | ORAL | Status: DC | PRN

## 2012-11-23 MED ORDER — SODIUM CHLORIDE 0.9 % IJ SOLN
3.0000 mL | Freq: Two times a day (BID) | INTRAMUSCULAR | Status: DC
  Administered 2012-11-24: 3 mL via INTRAVENOUS

## 2012-11-23 MED ORDER — HEPARIN (PORCINE) IN NACL 100-0.45 UNIT/ML-% IJ SOLN
1100.0000 [IU]/h | INTRAMUSCULAR | Status: DC
  Administered 2012-11-23 – 2012-11-24 (×2): 1100 [IU]/h via INTRAVENOUS
  Filled 2012-11-23 (×3): qty 250

## 2012-11-23 MED ORDER — ONDANSETRON HCL 4 MG/2ML IJ SOLN: 4.0000 mg | Freq: Four times a day (QID) | INTRAMUSCULAR | Status: DC | PRN

## 2012-11-23 MED ORDER — NITROGLYCERIN 0.4 MG SL SUBL
SUBLINGUAL_TABLET | SUBLINGUAL | Status: AC
  Administered 2012-11-23: 0.4 mg
  Filled 2012-11-23: qty 25

## 2012-11-23 MED ORDER — NITROGLYCERIN IN D5W 200-5 MCG/ML-% IV SOLN
2.0000 ug/min | INTRAVENOUS | Status: DC
  Administered 2012-11-23: 5 ug/min via INTRAVENOUS
  Filled 2012-11-23: qty 250

## 2012-11-23 NOTE — ED Notes (Signed)
[place] cardio at bedside

## 2012-11-23 NOTE — Consult Note (Signed)
ANTICOAGULATION CONSULT NOTE - Follow Up Consult  Pharmacy Consult for Heparin Indication: chest pain/ACS  Allergies  Allergen Reactions  . Meperidine Hcl Other (See Comments)    "CAN'T MOVE" CATATONIC PER PT.    Patient Measurements: Height: 6' (182.9 cm) Weight: 200 lb (90.719 kg) IBW/kg (Calculated) : 77.6  Heparin Dosing Weight: 90.7kg  Vital Signs: Temp: 98.4 F (36.9 C) (01/19 1059) Temp src: Oral (01/19 1059) BP: 139/62 mmHg (01/19 1059) Pulse Rate: 56  (01/19 0838)  Labs:  Basename 11/23/12 0900  HGB 13.6  HCT 40.2  PLT 118*  APTT --  LABPROT 15.0  INR 1.20  HEPARINUNFRC --  CREATININE 1.46*  CKTOTAL --  CKMB --  TROPONINI --    Estimated Creatinine Clearance: 49.5 ml/min (by C-G formula based on Cr of 1.46).  Medications:  No anticoagulants pta  Assessment: 71yom with hx CAD s/p stent in 2011 and NSTEMI in 2012 presents to the ED with CP. He will begin heparin. Baseline CBC wnl. Renal insufficiency noted.  Goal of Therapy:  Heparin level 0.3-0.7 units/ml Monitor platelets by anticoagulation protocol: Yes   Plan:  1) Heparin bolus 4000 units x 1 2) Heparin drip at 1100 units/hr 3) 8 hour heparin level 4) Daily heparin level and CBC  Fredrik Rigger 11/23/2012,12:55 PM

## 2012-11-23 NOTE — Telephone Encounter (Signed)
Pt had weakness, chest pain and had taken NTG. She is bringing him in.  Advised her to get ER staff to eval and call us PRN.

## 2012-11-23 NOTE — ED Notes (Signed)
Per pt was sitting in the chair this am and started having dizziness, chest pain and bilateral arm numbness. sts just doesn't feel well.

## 2012-11-23 NOTE — H&P (Signed)
History and Physical   Patient ID: [name] MRN: [id number], DOB/AGE: 10-Jun-1941 72 y.o. Date of Encounter: 11/23/2012  Primary Physician: [name], MD Primary Cardiologist: PJ  Chief Complaint:  Chest pain  HPI: [name] is a 72 year old male with a history of CAD. He was in his usual state of health recently except for a cough. 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   Today, he got up as usual and drank 2 cups of coffee. He then had onset of dizziness, weakness and tingling down both arms. He got lower left chest pain at a 6/10. No radiation. No change with deep inspiration or movement. No SOB, N&V or diaphoresis. He came to the ER and took a SL NTG en route. It helped the pain but the pain returned. In the ER, the pain is still a 6/10. It is not like his MI in 1992, but he cannot remember every having pain like this before. He cannot remember the symptoms that preceded his cath in 2012.   Past Medical History  Diagnosis Date  . CAD (coronary artery disease)     s/p MI 1982;  s/p DES to CFX 2011;  s/p NSTEMI 4/12 in setting of VTach;  cath 7/12:  LM ok, LAD mid 30-40%, Dx's with 40%, mCFX stent patent, prox to mid RCA occluded with R to R and L to R collats.  Medical Tx was continued  . Bradycardia   . Ventricular tachycardia     s/p AICD;   h/o ICD shock;  Amiodarone Rx. S/P ablation in 2012  . 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 HTN (hypertension)   . HLD (hyperlipidemia)   . Cytopenia   . Hypothyroidism   . Renal insufficiency   . Claudication   . 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 Chronic systolic heart failure   . Ischemic cardiomyopathy     echo 7/12:  EF 25-30%, mild AI, mod MR, mod LAE  . TIA (transient ischemic attack)   . CVD (cerebrovascular disease)     left CEA  2008  . Arthritis   . Stroke   . Anemia      Surgical History:  Past Surgical History  Procedure Date  . Femoral-popliteal bypass graft 2011  . Cardiac defibrillator placement     Medtronic  . Revascularization / in-situ graft leg   . Renal artery bypass   . Back surgery   . Lumbar laminectomy   . Knee surgery     bilateral  . Elbow surgery     right  . Cholecystectomy   . Lipoma excision     scalp  . Bone spur     heel  . Cataract extraction     bilateral  . Carotid stent     left  . Carotid endarterectomy   . Doppler echocardiography 2011     I have reviewed the patient's current medications. Medication Sig  amiodarone (PACERONE) 400 MG tablet Take 200-400 mg by mouth See admin instructions. Take 400 mg 5 days a week and 200 mg 2 days a week on Weds and Sat.  aspirin 81 MG tablet Take 81 mg by mouth daily.   atorvastatin (LIPITOR) 80  MG tablet Take 80 mg by mouth daily.   CALCIUM-MAGNESIUM-ZINC PO Take 3 tablets by mouth at bedtime. 1000 mg calcium, 400 mg magnesium, 25 mg zinc  carvedilol (COREG) 6.25 MG tablet Take 1 tablet (6.25 mg total) by mouth 2 (two) times daily with a meal.  Chlorpheniramine-Acetaminophen (CORICIDIN HBP COLD/FLU PO) Take 2 tablets by mouth every 4 (four) hours as needed. Cold symptoms  clopidogrel (PLAVIX) 75 MG tablet Take 75 mg by mouth daily.   furosemide (LASIX) 20 MG tablet Take 20 mg by mouth daily.   levothyroxine (SYNTHROID) 112 MCG tablet Take 1 tablet (112 mcg total) by mouth daily.  Multiple Vitamin (MULTIVITAMIN) tablet Take 1 tablet by mouth daily.   Multiple Vitamins-Minerals (EYE VITAMINS PO) Take by mouth 2 (two) times daily.    NIASPAN 500 MG CR tablet Take 1,500 mg by mouth daily.   nitroGLYCERIN (NITROSTAT) 0.4 MG SL tablet Place 0.4 mg under the tongue every 5 (five) minutes as needed. For chest pain  ramipril (ALTACE) 5 MG capsule Take 5 mg by mouth daily.     Allergies:  Allergies  Allergen Reactions  . Meperidine  Hcl Other (See Comments)    "CAN'T MOVE" CATATONIC PER PT.    History   Social History  . Marital Status: Divorced    Spouse Name: N/A    Number of Children: 2  . Years of Education: N/A   Occupational History  . retired     [employer]  . Retired     Security [employer]  . Engineer, structural at [employer]   Social History Main Topics  . Smoking status: Former Smoker -- 30 years    Types: Cigarettes    Quit date: 11/05/2001  . Smokeless tobacco: Never Used  . Alcohol Use: No  . Drug Use: Not on file  . Sexually Active: Not on file   Other Topics Concern  . Not on file   Social History Narrative   Lives with sig other.    Family History  Problem Relation Age of Onset  . Coronary artery disease    . Heart attack Mother   . Coronary artery disease Mother   . Coronary artery disease Father   . Stroke Father    Family Status  Relation Status Death Age  . Mother Deceased 51    cva  . Father Deceased   . Brother Alive     No CAD    Review of Systems:   Full 14-point review of systems otherwise negative except as noted above.   Physical Exam: Blood pressure 139/62, pulse 56, temperature 98.4 F (36.9 C), temperature source Oral, resp. rate 14, height 6' (1.829 m), weight 200 lb (90.719 kg), SpO2 99.00%. General: Well developed, well nourished, male in no acute distress. Head: Normocephalic, atraumatic, sclera non-icteric, no xanthomas, nares are without discharge. Dentition: poor Neck: Faint carotid bruits. JVD not elevated. No thyromegally,  Left  CEA scar Lungs: Good expansion bilaterally. without wheezes or rhonchi.  Heart: Regular rate and rhythm with S1 S2.  No S3 or S4.  No murmur, no rubs, or gallops.  No chest wall tenderness Abdomen: Soft, non-tender, non-distended with normoactive bowel sounds. No hepatomegaly. No rebound/guarding. No obvious abdominal masses. Msk:  Strength and tone appear normal for age. No joint deformities or effusions,  no spine or costo-vertebral angle tenderness. Extremities: No clubbing or cyanosis. No edema.  Distal pedal pulses are 2+ in 4 extrem Neuro: Alert and  oriented X 3. Moves all extremities spontaneously. No focal deficits noted. Psych:  Responds to questions appropriately with a normal affect. Skin: No rashes or lesions noted  Labs:   Lab Results  Component Value Date   WBC 7.6 11/23/2012   HGB 13.6 11/23/2012   HCT 40.2 11/23/2012   MCV 100.2* 11/23/2012   PLT 118* 11/23/2012    Basename 11/23/12 0900  INR 1.20     Lab 11/23/12 0900  NA 136  K 4.4  CL 102  CO2 23  BUN 24*  CREATININE 1.46*  CALCIUM 9.0  PROT 6.7  BILITOT 0.6  ALKPHOS 139*  ALT 41  AST 47*  GLUCOSE 108*   No results found for this basename: CKTOTAL:4,CKMB:4,TROPONINI:4 in the last 72 hours  Basename 11/23/12 1136 11/23/12 0918  TROPIPOC 0.03 0.01   Lab Results  Component Value Date   CHOL 118 05/29/2011   HDL 55 05/29/2011   LDLCALC 55 05/29/2011   TRIG 38 05/29/2011   Pro B Natriuretic peptide (BNP)  Date/Time Value Range Status  11/23/2012  9:01 AM 1891.0* 0 - 125 pg/mL Final  10/29/2010  3:40 AM 293.0* 0.0 - 100.0 pg/mL Final   No results found for this basename: DDIMER    Radiology/Studies:  Dg Chest 2 View 11/23/2012  *RADIOLOGY REPORT*  Clinical Data: Chest pain shortness of breath.  History of pacemaker.  CHEST - 2 VIEW  Comparison: 05/29/2011  Findings: Pacer / AICD device at the right ventricle.  Midline trachea.  Mild cardiomegaly with atherosclerosis in the transverse aorta. No pleural effusion or pneumothorax.  No congestive failure.  Clear lungs.  IMPRESSION: Cardiomegaly without congestive failure.   Original Report Authenticated By: Jeronimo Greaves, M.D.      Cardiac Cath: 05/29/2011 1. The left main coronary artery had mild plaque disease.  2. The left anterior descending was a large vessel that coursed to the  apex. There was mild 30-40% plaque throughout the mid LAD, but no  focally  obstructive lesions. There were several moderate-sized  diagonal branches that had diffuse 40% stenosis, but no focally  obstructive lesions.  3. The circumflex artery gave off an early obtuse marginal branch that  was patent with mild plaque. The mid circumflex artery had a stent  that was patent with no restenosis.  4. The right coronary artery is a large dominant vessel with 100%  total occlusion in the proximal to midportion of the vessel. The  distal vessel fills both from right-to-right collaterals and left-  to-right collaterals. This vessel was unchanged in appearance  since prior catheterization.  5. No left ventricular angiogram was performed.  IMPRESSION:  1. Double-vessel coronary artery disease.  2. Patent stent in the mid circumflex artery.  3. Chronic total occlusion of the mid right coronary artery that is  unchanged.  RECOMMENDATIONS: I recommend continued medical management at this time.   Echo: 10/29/2010 Conclusions - Left ventricle: The cavity size was moderately dilated. Wall thickness was normal. Systolic function was moderately reduced. The estimated ejection fraction was in the range of 35% to 40%. There is akinesis of the inferoposterior myocardium. - Aortic valve: Mild regurgitation. - Mitral valve: Mild regurgitation. - Left atrium: The atrium was moderately dilated.   ECG:  23-Nov-2012 08:38:07 [facility]/ED ROUTINE RECORD Sinus bradycardia Non-specific intra-ventricular conduction block Inferior infarct , age undetermined T wave abnormality, consider lateral ischemia Abnormal ECG 71mm/s 54mm/mV 100Hz  8.0.1 12SL 241 CID: 1 Referred by: Unconfirmed Vent. rate 56 BPM PR  interval 200 ms QRS duration 134 ms QT/QTc 494/476 ms P-R-T axes 75 40 150  ASSESSMENT AND PLAN:   1.  Intermediate coronary syndrome Patient presents with persistence of chest pain since 7 AM. He has had some relief with sublingual and then IV nitroglycerin.  We will also be starting heparin on him.  His enzymes as of now are negative. He continues to have chest pain and if his enzymes become positive then we will proceed with cardiac catheterization today. Otherwise  We will plan on doing a cath tomorrow which will give [place]  a chance to hydrate him since he has moderate chronic renal insufficiency.  2.   RENAL INSUFFICIENCY -will hydrate gently in preparation for possible cardiac catheterization tomorrow.   3.  HTN (hypertension) Blood pressure is okay.  4.  Chronic systolic heart failure   Signed,  Elyn Aquas.  11/23/2012, 1:04 PM  '

## 2012-11-23 NOTE — ED Notes (Signed)
Pt eating dinner

## 2012-11-23 NOTE — ED Provider Notes (Signed)
History     CSN: [id number]  Arrival date & time 11/23/12  4098   First MD Initiated Contact with Patient 11/23/12 0914      Chief Complaint  Patient presents with  . Chest Pain    HPI Per pt was sitting in the chair this am and started having dizziness, chest pain and bilateral arm numbness. sts just doesn't feel well.  Patient has history of coronary disease with stents and pacemaker/defibrillator.  Patient denies diaphoresis or nausea vomiting.  Past Medical History  Diagnosis Date  . CAD (coronary artery disease)     s/p MI 1982;  s/p DES to CFX 2011;  s/p NSTEMI 4/12 in setting of VTach;  cath 7/12:  LM ok, LAD mid 30-40%, Dx's with 40%, mCFX stent patent, prox to mid RCA occluded with R to R and L to R collats.  Medical Tx was continued  . Bradycardia   . Ventricular tachycardia     s/p AICD;   h/o ICD shock;  Amiodarone Rx. S/P ablation in 2012  . 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 HTN (hypertension)   . HLD (hyperlipidemia)   . Cytopenia   . Hypothyroidism   . Renal insufficiency   . Claudication   . 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 Chronic systolic heart failure   . Ischemic cardiomyopathy     echo 7/12:  EF 25-30%, mild AI, mod MR, mod LAE  . TIA (transient ischemic attack)   . CVD (cerebrovascular disease)     left CEA 2008  . Arthritis   . Stroke   . Anemia     Past Surgical History  Procedure Date  . Femoral-popliteal bypass graft 2011  . Cardiac defibrillator placement     [employer]tronic  . Revascularization / in-situ graft leg   . Renal artery bypass   . Back surgery   . Lumbar laminectomy   . Knee surgery     bilateral  . Elbow surgery     right  . Cholecystectomy   . Lipoma excision     scalp  . Bone spur     heel  . Cataract extraction     bilateral  . Carotid stent     left  . Carotid  endarterectomy   . Doppler echocardiography 2011    Family History  Problem Relation Age of Onset  . Coronary artery disease    . Heart attack Mother   . Coronary artery disease Mother   . Coronary artery disease Father   . Stroke Father     History  Substance Use Topics  . Smoking status: Former Smoker -- 30 years    Types: Cigarettes    Quit date: 11/05/2001  . Smokeless tobacco: Never Used  . Alcohol Use: No      Review of Systems All other systems reviewed and are negative Allergies  Meperidine hcl  Home Medications   No current outpatient prescriptions on file.  BP 114/45  Pulse 59  Temp 98.4 F (36.9 C) (Oral)  Resp 16  Ht 6' (1.829 m)  Wt 200 lb (90.719 kg)  BMI 27.12 kg/m2  SpO2 92%  Physical Exam  Nursing note and vitals reviewed. Constitutional: He is oriented to person, place, and time. He appears well-developed  and well-nourished. No distress.  HENT:  Head: Normocephalic and atraumatic.  Eyes: Pupils are equal, round, and reactive to light.  Neck: Normal range of motion.  Cardiovascular: Normal rate and intact distal pulses.  Exam reveals no friction rub.   No murmur heard. Pulmonary/Chest: No respiratory distress. He has no wheezes.  Abdominal: Normal appearance. He exhibits no distension.  Musculoskeletal: Normal range of motion.  Neurological: He is alert and oriented to person, place, and time. No cranial nerve deficit.  Skin: Skin is warm and dry. No rash noted.  Psychiatric: He has a normal mood and affect. His behavior is normal.    ED Course  Procedures (including critical care time)  Labs Reviewed  CBC WITH DIFFERENTIAL - Abnormal; Notable for the following:    RBC 4.01 (*)     MCV 100.2 (*)     Platelets 118 (*)  PLATELET COUNT CONFIRMED BY SMEAR   Lymphocytes Relative 11 (*)     All other components within normal limits  COMPREHENSIVE METABOLIC PANEL - Abnormal; Notable for the following:    Glucose, Bld 108 (*)     BUN 24  (*)     Creatinine, Ser 1.46 (*)     AST 47 (*)     Alkaline Phosphatase 139 (*)     GFR calc non Af [name] 46 (*)     GFR calc Af [name] 53 (*)     All other components within normal limits  PRO B NATRIURETIC PEPTIDE - Abnormal; Notable for the following:    Pro B Natriuretic peptide (BNP) 1891.0 (*)     All other components within normal limits  PROTIME-INR  POCT I-STAT TROPONIN I  POCT I-STAT TROPONIN I  TROPONIN I  MRSA PCR SCREENING  HEPARIN LEVEL (UNFRACTIONATED)  TROPONIN I  TROPONIN I  HEPARIN LEVEL (UNFRACTIONATED)  CBC  COMPREHENSIVE METABOLIC PANEL   Dg Chest 2 View  11/23/2012  *RADIOLOGY REPORT*  Clinical Data: Chest pain shortness of breath.  History of pacemaker.  CHEST - 2 VIEW  Comparison: 05/29/2011  Findings: Pacer / AICD device at the right ventricle.  Midline trachea.  Mild cardiomegaly with atherosclerosis in the transverse aorta. No pleural effusion or pneumothorax.  No congestive failure.  Clear lungs.  IMPRESSION: Cardiomegaly without congestive failure.   Original Report Authenticated By: Jeronimo Greaves, M.D.      1. Chest pain   2. CAD (coronary artery disease)       MDM          Nelia Shi, MD 11/23/12 2125

## 2012-11-23 NOTE — Consult Note (Signed)
ANTICOAGULATION CONSULT NOTE - Follow Up Consult  Pharmacy Consult for Heparin Indication: chest pain/ACS  Allergies  Allergen Reactions  . Meperidine Hcl Other (See Comments)    "CAN'T MOVE" CATATONIC PER PT.    Patient Measurements: Height: 6' (182.9 cm) Weight: 200 lb (90.719 kg) IBW/kg (Calculated) : 77.6  Heparin Dosing Weight: 90.7kg  Vital Signs: Temp: 98.4 F (36.9 C) (01/19 1059) Temp src: Oral (01/19 1059) BP: 114/45 mmHg (01/19 2100) Pulse Rate: 59  (01/19 1900)  Labs:  Basename 11/23/12 2008 11/23/12 1516 11/23/12 0900  HGB -- -- 13.6  HCT -- -- 40.2  PLT -- -- 118*  APTT -- -- --  LABPROT -- -- 15.0  INR -- -- 1.20  HEPARINUNFRC 0.34 -- --  CREATININE -- -- 1.46*  CKTOTAL -- -- --  CKMB -- -- --  TROPONINI -- <0.30 --    Estimated Creatinine Clearance: 49.5 ml/min (by C-G formula based on Cr of 1.46).  Medications:  No anticoagulants pta  Assessment: 71yom with hx CAD s/p stent in 2011 and NSTEMI in 2012 presents to the ED with CP. He will begin heparin. Baseline CBC wnl. Renal insufficiency noted. First heparin level 0.34 in goal.  Goal of Therapy:  Heparin level 0.3-0.7 units/ml Monitor platelets by anticoagulation protocol: Yes   Plan:  Continue heparin at 1100 units/hr and recheck level in am.  Misty Stanley Stillinger 11/23/2012,9:54 PM

## 2012-11-24 ENCOUNTER — Encounter (HOSPITAL_COMMUNITY)
Admission: EM | Disposition: A | Discharge status: Home / Self Care | Payer: Self-pay | Source: Home / Self Care | Attending: Cardiovascular Disease

## 2012-11-24 DIAGNOSIS — I2510 Atherosclerotic heart disease of native coronary artery without angina pectoris: Secondary | ICD-10-CM

## 2012-11-24 HISTORY — PX: LEFT HEART CATHETERIZATION WITH CORONARY ANGIOGRAM: SHX5451

## 2012-11-24 LAB — COMPREHENSIVE METABOLIC PANEL
ALT: 34 U/L (ref 0–53)
Albumin: 3.1 g/dL — ABNORMAL LOW (ref 3.5–5.2)
Alkaline Phosphatase: 123 U/L — ABNORMAL HIGH (ref 39–117)
Chloride: 100 mEq/L (ref 96–112)
GFR calc Af Amer: 50 mL/min — ABNORMAL LOW (ref >90)
Glucose, Bld: 109 mg/dL — ABNORMAL HIGH (ref 70–99)
Potassium: 4.4 mEq/L (ref 3.5–5.1)
Sodium: 133 mEq/L — ABNORMAL LOW (ref 135–145)
Total Bilirubin: 0.5 mg/dL (ref 0.3–1.2)
Total Protein: 6.1 g/dL (ref 6.0–8.3)

## 2012-11-24 LAB — CBC
Hemoglobin: 12.3 g/dL — ABNORMAL LOW (ref 13.0–17.0)
MCHC: 33.7 g/dL (ref 30.0–36.0)
RBC: 3.67 MIL/uL — ABNORMAL LOW (ref 4.22–5.81)
WBC: 6.5 10*3/uL (ref 4.0–10.5)

## 2012-11-24 LAB — BASIC METABOLIC PANEL
CO2: 24 mEq/L (ref 19–32)
GFR calc non Af Amer: 51 mL/min — ABNORMAL LOW (ref >90)
Glucose, Bld: 114 mg/dL — ABNORMAL HIGH (ref 70–99)
Potassium: 4.3 mEq/L (ref 3.5–5.1)
Sodium: 137 mEq/L (ref 135–145)

## 2012-11-24 LAB — TROPONIN I: Troponin I: <0.3 ng/mL (ref <0.30)

## 2012-11-24 LAB — HEPARIN LEVEL (UNFRACTIONATED): Heparin Unfractionated: 0.59 IU/mL (ref 0.30–0.70)

## 2012-11-24 SURGERY — LEFT HEART CATHETERIZATION WITH CORONARY ANGIOGRAM
Anesthesia: LOCAL

## 2012-11-24 MED ORDER — HEPARIN (PORCINE) IN NACL 2-0.9 UNIT/ML-% IJ SOLN
INTRAMUSCULAR | Status: AC
  Filled 2012-11-24: qty 1000

## 2012-11-24 MED ORDER — FENTANYL CITRATE 0.05 MG/ML IJ SOLN
INTRAMUSCULAR | Status: AC
  Filled 2012-11-24: qty 2

## 2012-11-24 MED ORDER — AMIODARONE HCL 200 MG PO TABS: 200.0000 mg | ORAL_TABLET | ORAL | Status: DC

## 2012-11-24 MED ORDER — VERAPAMIL HCL 2.5 MG/ML IV SOLN
INTRAVENOUS | Status: AC
  Filled 2012-11-24: qty 2

## 2012-11-24 MED ORDER — NIACIN ER (ANTIHYPERLIPIDEMIC) 500 MG PO TBCR
1500.0000 mg | EXTENDED_RELEASE_TABLET | Freq: Every day | ORAL | Status: DC
  Administered 2012-11-24 – 2012-11-25 (×2): 1500 mg via ORAL
  Filled 2012-11-24 (×2): qty 3

## 2012-11-24 MED ORDER — AMIODARONE HCL 200 MG PO TABS
400.0000 mg | ORAL_TABLET | ORAL | Status: DC
  Administered 2012-11-24 – 2012-11-25 (×2): 400 mg via ORAL
  Filled 2012-11-24 (×2): qty 2

## 2012-11-24 MED ORDER — SODIUM CHLORIDE 0.9 % IV SOLN
1.0000 mL/kg/h | INTRAVENOUS | Status: AC
  Administered 2012-11-24: 1 mL/kg/h via INTRAVENOUS

## 2012-11-24 MED ORDER — HEPARIN SODIUM (PORCINE) 1000 UNIT/ML IJ SOLN
INTRAMUSCULAR | Status: AC
  Filled 2012-11-24: qty 1

## 2012-11-24 MED ORDER — FUROSEMIDE 20 MG PO TABS
20.0000 mg | ORAL_TABLET | Freq: Every day | ORAL | Status: DC
  Administered 2012-11-24 – 2012-11-25 (×2): 20 mg via ORAL
  Filled 2012-11-24 (×2): qty 1

## 2012-11-24 MED ORDER — LEVOTHYROXINE SODIUM 112 MCG PO TABS
112.0000 ug | ORAL_TABLET | Freq: Every day | ORAL | Status: DC
  Administered 2012-11-25: 07:00:00 112 ug via ORAL
  Filled 2012-11-24 (×2): qty 1

## 2012-11-24 MED ORDER — ATORVASTATIN CALCIUM 80 MG PO TABS
80.0000 mg | ORAL_TABLET | Freq: Every day | ORAL | Status: DC
  Administered 2012-11-24 – 2012-11-25 (×2): 80 mg via ORAL
  Filled 2012-11-24 (×2): qty 1

## 2012-11-24 MED ORDER — CLOPIDOGREL BISULFATE 75 MG PO TABS
75.0000 mg | ORAL_TABLET | Freq: Every day | ORAL | Status: DC
  Administered 2012-11-24 – 2012-11-25 (×2): 75 mg via ORAL
  Filled 2012-11-24 (×2): qty 1

## 2012-11-24 MED ORDER — RAMIPRIL 5 MG PO CAPS
5.0000 mg | ORAL_CAPSULE | Freq: Every day | ORAL | Status: DC
  Administered 2012-11-24 – 2012-11-25 (×2): 5 mg via ORAL
  Filled 2012-11-24 (×2): qty 1

## 2012-11-24 MED ORDER — AMIODARONE IV BOLUS ONLY 150 MG/100ML
150.0000 mg | Freq: Once | INTRAVENOUS | Status: AC
  Administered 2012-11-24: 150 mg via INTRAVENOUS
  Filled 2012-11-24: qty 100

## 2012-11-24 MED ORDER — CARVEDILOL 6.25 MG PO TABS
6.2500 mg | ORAL_TABLET | Freq: Two times a day (BID) | ORAL | Status: DC
Start: 2012-11-24 — End: 2012-11-25
  Administered 2012-11-24 – 2012-11-25 (×2): 6.25 mg via ORAL
  Filled 2012-11-24 (×5): qty 1

## 2012-11-24 MED ORDER — MIDAZOLAM HCL 2 MG/2ML IJ SOLN
INTRAMUSCULAR | Status: AC
  Filled 2012-11-24: qty 2

## 2012-11-24 MED ORDER — ASPIRIN EC 81 MG PO TBEC
81.0000 mg | DELAYED_RELEASE_TABLET | Freq: Every day | ORAL | Status: DC
  Administered 2012-11-25: 10:00:00 81 mg via ORAL
  Filled 2012-11-24 (×2): qty 1

## 2012-11-24 MED ORDER — NITROGLYCERIN 0.2 MG/ML ON CALL CATH LAB
INTRAVENOUS | Status: AC
  Filled 2012-11-24: qty 1

## 2012-11-24 MED ORDER — AMIODARONE HCL 200 MG PO TABS
200.0000 mg | ORAL_TABLET | Freq: Every day | ORAL | Status: DC
  Filled 2012-11-24: qty 1

## 2012-11-24 MED ORDER — LIDOCAINE HCL (PF) 1 % IJ SOLN
INTRAMUSCULAR | Status: AC
  Filled 2012-11-24: qty 30

## 2012-11-24 MED ORDER — CARVEDILOL 6.25 MG PO TABS
6.2500 mg | ORAL_TABLET | Freq: Two times a day (BID) | ORAL | Status: DC
  Filled 2012-11-24: qty 1

## 2012-11-24 MED ORDER — AMIODARONE HCL 200 MG PO TABS: 400.0000 mg | ORAL_TABLET | ORAL | Status: DC

## 2012-11-24 MED ORDER — ADULT MULTIVITAMIN W/MINERALS CH
1.0000 | ORAL_TABLET | Freq: Every day | ORAL | Status: DC
  Administered 2012-11-24 – 2012-11-25 (×2): 1 via ORAL
  Filled 2012-11-24 (×2): qty 1

## 2012-11-24 NOTE — Consult Note (Signed)
ANTICOAGULATION CONSULT NOTE - Follow Up Consult  Pharmacy Consult for Heparin Indication: chest pain/ACS  Allergies  Allergen Reactions  . Meperidine Hcl Other (See Comments)    "CAN'T MOVE" CATATONIC PER PT.    Patient Measurements: Height: 6' (182.9 cm) Weight: 195 lb 5.2 oz (88.6 kg) IBW/kg (Calculated) : 77.6  Heparin Dosing Weight: 90.7kg  Vital Signs: Temp: 98.4 F (36.9 C) (01/20 0745) Temp src: Oral (01/20 0745) BP: 146/74 mmHg (01/20 0745) Pulse Rate: 62  (01/20 0745)  Labs:  Basename 11/24/12 0145 11/24/12 0144 11/23/12 2008 11/23/12 1516 11/23/12 0900  HGB 12.3* -- -- -- 13.6  HCT 36.5* -- -- -- 40.2  PLT 121* -- -- -- 118*  APTT -- -- -- -- --  LABPROT -- -- -- -- 15.0  INR -- -- -- -- 1.20  HEPARINUNFRC 0.59 -- 0.34 -- --  CREATININE 1.55* -- -- -- 1.46*  CKTOTAL -- -- -- -- --  CKMB -- -- -- -- --  TROPONINI -- <0.30 <0.30 <0.30 --    Estimated Creatinine Clearance: 46.6 ml/min (by C-G formula based on Cr of 1.55).  Medications:  No anticoagulants pta  Assessment: 71yom with hx CAD s/p stent in 2011 and NSTEMI in 2012 presents to the ED with CP. Patient is on heparin and at goal (HL= 0.59) with H/H= 12.3/36.5 and plt=121.  Goal of Therapy:  Heparin level 0.3-0.7 units/ml Monitor platelets by anticoagulation protocol: Yes   Plan:  -No heparin changes needed -Will follow post cath today  [name], Pharm D 11/24/2012 9:50 AM

## 2012-11-24 NOTE — Progress Notes (Signed)
Pt's SCD has been ordered again, and still waiting for it. AM rn has been made aware to follow up on it.---Frank Appelt, rn

## 2012-11-24 NOTE — Interval H&P Note (Signed)
History and Physical Interval Note:  11/24/2012 3:15 PM  [name]  has presented today for surgery, with the diagnosis of cp  The various methods of treatment have been discussed with the patient and family. After consideration of risks, benefits and other options for treatment, the patient has consented to  Procedure(s) (LRB) with comments: LEFT HEART CATHETERIZATION WITH CORONARY ANGIOGRAM (N/A) as a surgical intervention .  The patient's history has been reviewed, patient examined, no change in status, stable for surgery.  I have reviewed the patient's chart and labs.  Questions were answered to the patient's satisfaction.     Theron Arista [facility] 11/24/2012 3:15 PM

## 2012-11-24 NOTE — Care Management Note (Signed)
    Page 1 of 1   11/24/2012     10:09:02 AM   CARE MANAGEMENT NOTE 11/24/2012  Patient:  [name], [name]   Account Number:  [id number]  Date Initiated:  11/24/2012  Documentation initiated by:  Junius Creamer  Subjective/Objective Assessment:   adm w ch pain     Action/Plan:   lives w fam, pcp dr [name]   Anticipated DC Date:     Anticipated DC Plan:        DC Planning Services  CM consult      Choice offered to / List presented to:             Status of service:   Medicare Important Message given?   (If response is "NO", the following Medicare IM given date fields will be blank) Date Medicare IM given:   Date Additional Medicare IM given:    Discharge Disposition:    Per UR Regulation:  Reviewed for med. necessity/level of care/duration of stay  If discussed at Long Length of Stay Meetings, dates discussed:    Comments:  1/20 1008 debbie Angeleen Horney rn,bsn

## 2012-11-24 NOTE — CV Procedure (Signed)
   Cardiac Catheterization Procedure Note  Name: [name] MRN: [id number] DOB: 1941-06-22  Procedure: Left Heart Cath, Selective Coronary Angiography  Indication: 72 year old white male with history of coronary disease and remote inferior myocardial infarction. He is status post stenting of the left circumflex. He presents now with symptoms of prolonged chest pain.   Procedural Details: The right wrist was prepped, draped, and anesthetized with 1% lidocaine. Using the modified Seldinger technique, a 5 French sheath was introduced into the right radial artery. 3 mg of verapamil was administered through the sheath, weight-based unfractionated heparin was administered intravenously. Standard Judkins catheters were used for selective coronary angiography and left ventriculography. Catheter exchanges were performed over an exchange length guidewire. There were no immediate procedural complications. A TR band was used for radial hemostasis at the completion of the procedure.  The patient was transferred to the post catheterization recovery area for further monitoring.  Procedural Findings: Hemodynamics: AO 155/73 with a mean of 105 mmHg LV 156/20 mmHg  Coronary angiography: Coronary dominance: right  Left mainstem:  Left main coronary has the centric heavy calcification on the superior portion of the vessel. There is mild ostial disease up to 40%.  Left anterior descending (LAD): The left anterior descending artery is a large vessel which is moderately calcified in the proximal vessel. There is 40% proximal disease. The mid vessel has scattered irregularities less than 20%.  Left circumflex (LCx): The left circumflex coronary gives rise to 2 marginal branches. There is a stent in the mid circumflex. It is widely patent. There is mild disease throughout up to 20-30%.  Right coronary artery (RCA): The right coronary arises and distributes normally. It is heavily calcified at the ostium. The  proximal vessel there is a long segment of disease up to 95%. The distal vessel is small. It also fills by left to right collaterals.  Left ventriculography: Not performed  Final Conclusions:   1. Single vessel obstructive coronary disease with chronically occluded right coronary which has recanalized. The stent in the left circumflex is still widely patent.  Recommendations: Continue medical management. Given his chronic renal insufficiency we will hydrate him overnight and plan on discharge in the morning.  Theron Arista [facility] 11/24/2012, 4:01 PM

## 2012-11-24 NOTE — Progress Notes (Signed)
Pt is on a nitro. Drip 4.5cc/hr, pt is chest pain free, at 0100, bp=104/44, pt has been brady, HR=53, Dr. Donnie Aho made aware, he said since pt is asleep it is ok to still continue the nitro drip. Will continue to monitor pt.----Liara Holm, rn

## 2012-11-24 NOTE — Progress Notes (Signed)
Pharmacist Heart Failure Core Measure Documentation  Assessment: [name] has an EF documented as 25-30% on 7/20112 by Echo (per H&P).  Rationale: Heart failure patients with left ventricular systolic dysfunction (LVSD) and an EF < 40% should be prescribed an angiotensin converting enzyme inhibitor (ACEI) or angiotensin receptor blocker (ARB) at discharge unless a contraindication is documented in the medical record.  This patient is not currently on an ACEI or ARB for HF.  This note is being placed in the record in order to provide documentation that a contraindication to the use of these agents is present for this encounter.  ACE Inhibitor or Angiotensin Receptor Blocker is contraindicated (specify all that apply)  []   ACEI allergy AND ARB allergy []   Angioedema []   Moderate or severe aortic stenosis []   Hyperkalemia []   Hypotension []   Renal artery stenosis [x]   Worsening renal function, preexisting renal disease or dysfunction  [name], Pharm D 11/24/2012 2:11 PM

## 2012-11-25 ENCOUNTER — Other Ambulatory Visit: Payer: Self-pay | Admitting: Physician Assistant

## 2012-11-25 DIAGNOSIS — Z9889 Other specified postprocedural states: Secondary | ICD-10-CM

## 2012-11-25 DIAGNOSIS — N189 Chronic kidney disease, unspecified: Secondary | ICD-10-CM

## 2012-11-25 LAB — BASIC METABOLIC PANEL
CO2: 25 mEq/L (ref 19–32)
Chloride: 101 mEq/L (ref 96–112)
Sodium: 136 mEq/L (ref 135–145)

## 2012-11-25 LAB — CBC
Platelets: 122 10*3/uL — ABNORMAL LOW (ref 150–400)
RBC: 3.83 MIL/uL — ABNORMAL LOW (ref 4.22–5.81)
WBC: 6.5 10*3/uL (ref 4.0–10.5)

## 2012-11-25 NOTE — Discharge Summary (Signed)
Discharge Summary   Patient ID: [name],  MRN: [id number], DOB/AGE: 02/25/1941 72 y.o.  Admit date: 11/23/2012 Discharge date: 11/25/2012  Primary Physician: Frank Postin, MD Primary Cardiologist: Swaziland, Peter, MD  Discharge Diagnoses Principal Problem:  *Intermediate coronary syndrome  - S/p cardiac cath 11/24/12: 40% ostial LM, 40% prox LAD, 20% mid LAD, widely patent LCx stent, 20-30% mild LCx disease throughout, 95% ostial RCA, chronic, filled by left to right collaterals; LV-gram deferred  - Continue medical management recommended Active Problems:  VENTRICULAR TACHYCARDIA  - One episode of VT post-cath requiring IV Amiodarone bolus with resolution  - To resume outpatient amiodarone regimen  RENAL INSUFFICIENCY  - Pre-cath Cr 1.34=> post-cath Cr 1.44  - Repeat BMET ordered for 11/28/12  Implantable cardiac defibrillator  MEDTRONIC  PVD (peripheral vascular disease)  HTN (hypertension)  HLD (hyperlipidemia)  Ischemic cardiomyopathy  Occlusion and stenosis of carotid artery without mention of cerebral infarction  CAD (coronary artery disease)  History of AAA (abdominal aortic aneurysm) repair   Allergies Allergies  Allergen Reactions  . Meperidine Hcl Other (See Comments)    "CAN'T MOVE" CATATONIC PER PT.    Diagnostic Studies/Procedures  PA/LATERAL CHEST X-RAY - 11/23/12  IMPRESSION:  Cardiomegaly without congestive failure.   CARDIAC CATHETERIZATION - 11/24/12  Hemodynamics:  AO 155/73 with a mean of 105 mmHg  LV 156/20 mmHg  Coronary angiography:  Coronary dominance: right  Left mainstem: Left main coronary has the centric heavy calcification on the superior portion of the vessel. There is mild ostial disease up to 40%.  Left anterior descending (LAD): The left anterior descending artery is a large vessel which is moderately calcified in the proximal vessel. There is 40% proximal disease. The mid vessel has scattered irregularities less than 20%.    Left circumflex (LCx): The left circumflex coronary gives rise to 2 marginal branches. There is a stent in the mid circumflex. It is widely patent. There is mild disease throughout up to 20-30%.  Right coronary artery (RCA): The right coronary arises and distributes normally. It is heavily calcified at the ostium. The proximal vessel there is a long segment of disease up to 95%. The distal vessel is small. It also fills by left to right collaterals.  Left ventriculography: Not performed  Final Conclusions:  1. Single vessel obstructive coronary disease with chronically occluded right coronary which has recanalized. The stent in the left circumflex is still widely patent.  Recommendations: Continue medical management. Given his chronic renal insufficiency we will hydrate him overnight and plan on discharge in the morning.  History of Present Illness/Hospital Course  [name] is a 72yo male who was admitted to [facility] hospital on 11/23/12 with the above problem list. He has a multiple vasculopathic issues including CAD (s/p prior PCI-LCx, prior inf MI), PVD (s/p bypass), AAA (s/p repair), CVD, CAS (s/p L CEA). He has an ischemic cardiomyopathy (EF 25-30%) and h/o VT s/p ICD and amiodarone therapy. He was in his USOH, except for a cough, until 11/23/12 when he experienced sudden onset dizziness, weakness and bilateral arm tingling. He then reported lower left chest pain and 6/10 without radiation. No aggravation with inspiration. He denied antecedent chest pain or shortness of breath. He thus presented to the ED. His initial cardiac enzymes returned WNL. Given his cardiac history, and symptoms concerning for ACS, he was heparinized and admitted for plans to pursue diagnostic cardiac catheterization. He effectively ruled out overnight (TnI negative x 3). Renal function was noted to be  reduced, but stable. He was hydrated overnight with Cr 1.55=>1.34. He remained stable the following day. He was informed,  consented and prepped for cardiac catheterization which is detailed in full above. This revealed single vessel obstructive coronary disease with chronically occluded right coronary artery which had recanalized, and patent LCx stent. He tolerated the procedure well without complications. Continued medical management with post-cath hydration was recommended. That evening, the patient did develop intermittent VT. Amiodarone 150 mg IV was administered with organization to V-pacing. He remained stable overnight. He was evaluated by Dr. Elease [name] the following morning. BMET revealed a creatinine of 1.44. VT remained quiescent. He discussed amiodarone recommendations with Dr. Graciela [name], who suggesting resuming his prior outpatient dosing. He was deemed stable for discharge. He will continue all outpatient medications. He will follow-up in 2-4 weeks with Dr. Swaziland as noted below. He will have a BMET drawn on 11/28/12 to assess renal function post-cath. This information, including post-cath instructions, has been clearly outlined in the discharge AVS.  Discharge Vitals:  Blood pressure 108/59, pulse 56, temperature 98 F (36.7 C), temperature source Oral, resp. rate 17, height 6' (1.829 m), weight 89.3 kg (196 lb 13.9 oz), SpO2 94.00%.   Weight change: -1.419 kg (-3 lb 2.1 oz)  Labs: Recent Labs  Basename 11/25/12 0430 11/24/12 0145   WBC 6.5 6.5   HGB 13.0 12.3*   HCT 38.3* 36.5*   MCV 100.0 99.5   PLT 122* 121*    Lab 11/25/12 0430 11/24/12 2127 11/24/12 0145  NA 136 137 133*  K 4.2 4.3 4.4  CL 101 102 100  CO2 25 24 27   BUN 16 16 20   CREATININE 1.44* 1.34 1.55*  CALCIUM 9.0 9.0 8.8  PROT -- -- 6.1  BILITOT -- -- 0.5  ALKPHOS -- -- 123*  ALT -- -- 34  AST -- -- 35  AMYLASE -- -- --  LIPASE -- -- --  GLUCOSE 105* 114* 109*   Recent Labs  Basename 11/24/12 0144 11/23/12 2008 11/23/12 1516   CKTOTAL -- -- --   CKMB -- -- --   CKMBINDEX -- -- --   TROPONINI <0.30 <0.30 <0.30   Disposition:    Discharge Orders    Future Appointments: Provider: Department: Dept Phone: Center:   11/28/2012 10:45 AM [facility] Main Office [place]) [phone] [facility]   12/16/2012 11:00 AM [name] M Swazil[place], MD [facility] Main Office [place]) [phone] [facility]   01/26/2013 11:25 AM [facility] Main Office [place]) [phone] [facility]   03/24/2013 1:30 PM [facility] [phone] [facility]   03/24/2013 2:30 PM [facility] [phone] [facility]   03/24/2013 3:00 PM [facility] [phone] [facility]   03/24/2013 4:00 PM Pryor Ochoa, MD [facility] [phone] [facility]     Future Orders Please Complete By Expires   Diet - low sodium heart healthy      Increase activity slowly            Follow-up Information    Follow up with [name] Sw[place], MD. On 12/16/2012. (At 11:00 AM for follow-up. )    Contact information:   [address] [place] [address] [phone]       Follow up with [facility]. On 11/28/2012. (At 10:45 AM for lab work. )    Solicitor information:   [address] [place]  [phone]         Discharge Medications:    Medication List     As of 11/25/2012 12:06 PM    CONTINUE taking these medications         amiodarone 400 MG tablet   Commonly known as: PACERONE      aspirin 81 MG tablet      atorvastatin 80 MG tablet   Commonly known as: LIPITOR      CALCIUM-MAGNESIUM-ZINC PO      carvedilol 6.25 MG tablet   Commonly known as: COREG   Take 1 tablet (6.25 mg total) by mouth 2 (two) times daily with a meal.      clopidogrel 75 MG tablet   Commonly known as: PLAVIX      CORICIDIN HBP COLD/FLU PO      EYE VITAMINS PO      furosemide 20 MG tablet   Commonly known as: LASIX      levothyroxine 112 MCG tablet    Commonly known as: SYNTHROID, LEVOTHROID   Take 1 tablet (112 mcg total) by mouth daily.      multivitamin tablet      NIASPAN 500 MG CR tablet   Generic drug: niacin      nitroGLYCERIN 0.4 MG SL tablet   Commonly known as: NITROSTAT      ramipril 5 MG capsule   Commonly known as: ALTACE         Outstanding Labs/Studies: BMET on 11/28/12  Duration of Discharge Encounter: Greater than 30 minutes including physician time.  Signed, R. Hurman Horn, PA-C 11/25/2012, 12:06 PM  Attending Note:  See my note from earlier today.  Kristeen Miss, MD

## 2012-11-25 NOTE — Progress Notes (Addendum)
About 2035 pm patient developed changes on monitor EKG done B/P stable and asymptomatic. Notified Dr. Terressa Koyanagi orders received to give 150 mg Amiodarone bolus then resume home dose he has not yet had for the day. He later converted to 1st degree HB with V-pacing.  Ekg's done per protocol available in Epic and the chart. I will contnue to monitor.

## 2012-11-25 NOTE — Progress Notes (Signed)
History and Physical   Patient ID: HEROLD SALGUERO MRN: [id number], DOB/AGE: December 30, 1940 72 y.o. Date of Encounter: 11/25/2012  Primary Physician: [name], MD Primary Cardiologist: Garth Bigness Primary EP: Graciela Husbands  Chief Complaint:  Chest pain  HPI: Mr Kings is a 72 year old male with a history of CAD. He was in his usual state of health recently except for a cough. 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   Sunday, he got up as usual and drank 2 cups of coffee. He then had onset of dizziness, weakness and tingling down both arms. He got lower left chest pain at a 6/10. No radiation. No change with deep inspiration or movement. No SOB, N&V or diaphoresis. He came to the ER and took a SL NTG en route. It helped the pain but the pain returned. In the ER, the pain is still a 6/10. It is not like his MI in 1992, but he cannot remember every having pain like this before. He cannot remember the symptoms that preceded his cath in 2012.   Cath on 1/20 did not show any significant worsening CAD.   He had 2 hours of VT last night that responded to IV amiodarone bolus.  He was asymptomatic during the episode.   This am he feels great. Ready to go home.    Past Medical History  Diagnosis Date  . CAD (coronary artery disease)     s/p MI 1982;  s/p DES to CFX 2011;  s/p NSTEMI 4/12 in setting of VTach;  cath 7/12:  LM ok, LAD mid 30-40%, Dx's with 40%, mCFX stent patent, prox to mid RCA occluded with R to R and L to R collats.  Medical Tx was continued  . Bradycardia   . Ventricular tachycardia     s/p AICD;   h/o ICD shock;  Amiodarone Rx. S/P ablation in 2012  . 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 HTN (hypertension)   . HLD (hyperlipidemia)   . Cytopenia   . Hypothyroidism   . Renal insufficiency   . Claudication   .  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 Chronic systolic heart failure   . Ischemic cardiomyopathy     echo 7/12:  EF 25-30%, mild AI, mod MR, mod LAE  . TIA (transient ischemic attack)   . CVD (cerebrovascular disease)     left CEA 2008  . Arthritis   . Stroke   . Anemia      Surgical History:  Past Surgical History  Procedure Date  . Femoral-popliteal bypass graft 2011  . Cardiac defibrillator placement     Medtronic  . Revascularization / in-situ graft leg   . Renal artery bypass   . Back surgery   . Lumbar laminectomy   . Knee surgery     bilateral  . Elbow surgery     right  . Cholecystectomy   . Lipoma excision     scalp  . Bone spur     heel  . Cataract extraction     bilateral  . Carotid stent     left  . Carotid endarterectomy   . Doppler echocardiography 2011     I have reviewed the patient's current medications.  Medication Sig  amiodarone (PACERONE) 400 MG tablet Take 200-400 mg by mouth See admin instructions. Take 400 mg 5 days a week and 200 mg 2 days a week on Weds and Sat.  aspirin 81 MG tablet Take 81 mg by mouth daily.   atorvastatin (LIPITOR) 80 MG tablet Take 80 mg by mouth daily.   CALCIUM-MAGNESIUM-ZINC PO Take 3 tablets by mouth at bedtime. 1000 mg calcium, 400 mg magnesium, 25 mg zinc  carvedilol (COREG) 6.25 MG tablet Take 1 tablet (6.25 mg total) by mouth 2 (two) times daily with a meal.  Chlorpheniramine-Acetaminophen (CORICIDIN HBP COLD/FLU PO) Take 2 tablets by mouth every 4 (four) hours as needed. Cold symptoms  clopidogrel (PLAVIX) 75 MG tablet Take 75 mg by mouth daily.   furosemide (LASIX) 20 MG tablet Take 20 mg by mouth daily.   levothyroxine (SYNTHROID) 112 MCG tablet Take 1 tablet (112 mcg total) by mouth daily.  Multiple Vitamin (MULTIVITAMIN) tablet Take 1 tablet by mouth daily.   Multiple Vitamins-Minerals (EYE VITAMINS PO) Take by mouth 2 (two) times daily.    NIASPAN 500 MG CR tablet Take 1,500 mg by mouth daily.     nitroGLYCERIN (NITROSTAT) 0.4 MG SL tablet Place 0.4 mg under the tongue every 5 (five) minutes as needed. For chest pain  ramipril (ALTACE) 5 MG capsule Take 5 mg by mouth daily.     Allergies:  Allergies  Allergen Reactions  . Meperidine Hcl Other (See Comments)    "CAN'T MOVE" CATATONIC PER PT.    History   Social History  . Marital Status: Divorced    Spouse Name: N/A    Number of Children: 2  . Years of Education: N/A   Occupational History  . retired     [employer]  . Retired     [employer]  . Engineer, structural at [employer]   Social History Main Topics  . Smoking status: Former Smoker -- 30 years    Types: Cigarettes    Quit date: 11/05/2001  . Smokeless tobacco: Never Used  . Alcohol Use: No  . Drug Use: Not on file  . Sexually Active: Not on file   Other Topics Concern  . Not on file   Social History Narrative   Lives with sig other.    Family History  Problem Relation Age of Onset  . Coronary artery disease    . Heart attack Mother   . Coronary artery disease Mother   . Coronary artery disease Father   . Stroke Father    Family Status  Relation Status Death Age  . Mother Deceased 30    cva  . Father Deceased   . Brother Alive     No CAD    Review of Systems:   Full 14-point review of systems otherwise negative except as noted above.   Physical Exam: Blood pressure 108/59, pulse 56, temperature 98 F (36.7 C), temperature source Oral, resp. rate 17, height 6' (1.829 m), weight 196 lb 13.9 oz (89.3 kg), SpO2 94.00%. General: Well developed, well nourished, male in no acute distress. Head: Normocephalic, atraumatic, sclera non-icteric, no xanthomas, nares are without discharge. Dentition: poor Neck: Faint carotid bruits. JVD not elevated. No thyromegally,  Left  CEA scar Lungs: Good expansion bilaterally. without wheezes or rhonchi.  Heart: Regular rate and rhythm with S1 S2.  No S3 or S4.  No murmur, no rubs, or  gallops.  No chest wall tenderness Abdomen: Soft,  non-tender, non-distended with normoactive bowel sounds. No hepatomegaly. No rebound/guarding. No obvious abdominal masses. Msk:  Strength and tone appear normal for age. No joint deformities or effusions, no spine or costo-vertebral angle tenderness. Extremities: No clubbing or cyanosis. No edema.  Distal pedal pulses are 2+ in 4 extrem Neuro: Alert and oriented X 3. Moves all extremities spontaneously. No focal deficits noted. Psych:  Responds to questions appropriately with a normal affect. Skin: No rashes or lesions noted  Labs:   Lab Results  Component Value Date   WBC 6.5 11/25/2012   HGB 13.0 11/25/2012   HCT 38.3* 11/25/2012   MCV 100.0 11/25/2012   PLT 122* 11/25/2012    Basename 11/23/12 0900  INR 1.20     Lab 11/25/12 0430 11/24/12 0145  NA 136 --  K 4.2 --  CL 101 --  CO2 25 --  BUN 16 --  CREATININE 1.44* --  CALCIUM 9.0 --  PROT -- 6.1  BILITOT -- 0.5  ALKPHOS -- 123*  ALT -- 34  AST -- 35  GLUCOSE 105* --    Basename 11/24/12 0144 11/23/12 2008 11/23/12 1516  CKTOTAL -- -- --  CKMB -- -- --  TROPONINI <0.30 <0.30 <0.30    Basename 11/23/12 1136 11/23/12 0918  TROPIPOC 0.03 0.01   Lab Results  Component Value Date   CHOL 118 05/29/2011   HDL 55 05/29/2011   LDLCALC 55 05/29/2011   TRIG 38 05/29/2011   Pro B Natriuretic peptide (BNP)  Date/Time Value Range Status  11/23/2012  9:01 AM 1891.0* 0 - 125 pg/mL Final  10/29/2010  3:40 AM 293.0* 0.0 - 100.0 pg/mL Final   No results found for this basename: DDIMER    Radiology/Studies:  Dg Chest 2 View 11/23/2012  *RADIOLOGY REPORT*  Clinical Data: Chest pain shortness of breath.  History of pacemaker.  CHEST - 2 VIEW  Comparison: 05/29/2011  Findings: Pacer / AICD device at the right ventricle.  Midline trachea.  Mild cardiomegaly with atherosclerosis in the transverse aorta. No pleural effusion or pneumothorax.  No congestive failure.  Clear lungs.   IMPRESSION: Cardiomegaly without congestive failure.   Original Report Authenticated By: Jeronimo Greaves, M.D.       ASSESSMENT AND PLAN:   1.  Intermediate coronary syndrome Cath revealed stable CAD. Continue medical management.  2. Ventricular tachycardia:  Responded to IV amiodarone.  He may need a higher dose.  Will have Dr. Graciela Husbands weigh in on that.  2.   RENAL INSUFFICIENCY - his creatinine is about baseline.  I would suggest that we re-check it on Friday if he goes home today.   3.  HTN (hypertension) Blood pressure is okay.   4.  Chronic systolic heart failure    Elyn Aquas.  11/25/2012, 10:06 AM  '

## 2012-11-27 ENCOUNTER — Other Ambulatory Visit: Payer: Self-pay | Admitting: *Deleted

## 2012-11-27 DIAGNOSIS — N189 Chronic kidney disease, unspecified: Secondary | ICD-10-CM

## 2012-11-27 DIAGNOSIS — N183 Chronic kidney disease, stage 3 unspecified: Secondary | ICD-10-CM

## 2012-11-28 ENCOUNTER — Other Ambulatory Visit (INDEPENDENT_AMBULATORY_CARE_PROVIDER_SITE_OTHER): Payer: Medicare Other

## 2012-11-28 DIAGNOSIS — I2510 Atherosclerotic heart disease of native coronary artery without angina pectoris: Secondary | ICD-10-CM

## 2012-11-28 DIAGNOSIS — N183 Chronic kidney disease, stage 3 unspecified: Secondary | ICD-10-CM

## 2012-11-28 LAB — LIPID PANEL
Cholesterol: 118 mg/dL (ref 0–200)
LDL Cholesterol: 58 mg/dL (ref 0–99)
Triglycerides: 48 mg/dL (ref 0.0–149.0)
VLDL: 9.6 mg/dL (ref 0.0–40.0)

## 2012-11-28 LAB — HEPATIC FUNCTION PANEL
ALT: 36 U/L (ref 0–53)
Albumin: 3.7 g/dL (ref 3.5–5.2)
Alkaline Phosphatase: 117 U/L (ref 39–117)
Total Protein: 6.8 g/dL (ref 6.0–8.3)

## 2012-11-28 LAB — BASIC METABOLIC PANEL
BUN: 25 mg/dL — ABNORMAL HIGH (ref 6–23)
CO2: 27 mEq/L (ref 19–32)
Calcium: 8.8 mg/dL (ref 8.4–10.5)
Chloride: 103 mEq/L (ref 96–112)
Creatinine, Ser: 1.7 mg/dL — ABNORMAL HIGH (ref 0.4–1.5)
Glucose, Bld: 132 mg/dL — ABNORMAL HIGH (ref 70–99)

## 2012-12-04 ENCOUNTER — Encounter: Payer: Self-pay | Admitting: Cardiology

## 2012-12-16 ENCOUNTER — Encounter: Payer: Medicare Other | Admitting: Cardiology

## 2012-12-20 ENCOUNTER — Other Ambulatory Visit: Payer: Self-pay

## 2013-01-19 ENCOUNTER — Encounter (INDEPENDENT_AMBULATORY_CARE_PROVIDER_SITE_OTHER): Payer: Medicare Other | Admitting: Cardiology

## 2013-01-19 ENCOUNTER — Telehealth: Payer: Self-pay

## 2013-01-19 DIAGNOSIS — I255 Ischemic cardiomyopathy: Secondary | ICD-10-CM

## 2013-01-19 DIAGNOSIS — Z95810 Presence of automatic (implantable) cardiac defibrillator: Secondary | ICD-10-CM

## 2013-01-19 MED ORDER — AMIODARONE HCL 400 MG PO TABS: 200.0000 mg | ORAL_TABLET | ORAL | Status: DC

## 2013-01-19 NOTE — Telephone Encounter (Signed)
Spoke to patient's wife amiodarone refill sent to pharmacy.

## 2013-01-26 ENCOUNTER — Other Ambulatory Visit: Payer: Self-pay

## 2013-01-26 ENCOUNTER — Ambulatory Visit (INDEPENDENT_AMBULATORY_CARE_PROVIDER_SITE_OTHER): Payer: Medicare Other | Admitting: *Deleted

## 2013-01-26 ENCOUNTER — Encounter: Payer: Self-pay | Admitting: Internal Medicine

## 2013-01-26 DIAGNOSIS — I2589 Other forms of chronic ischemic heart disease: Secondary | ICD-10-CM

## 2013-01-26 DIAGNOSIS — Z95810 Presence of automatic (implantable) cardiac defibrillator: Secondary | ICD-10-CM

## 2013-01-26 DIAGNOSIS — I255 Ischemic cardiomyopathy: Secondary | ICD-10-CM

## 2013-01-30 ENCOUNTER — Encounter: Payer: Self-pay | Admitting: Internal Medicine

## 2013-02-04 ENCOUNTER — Ambulatory Visit (INDEPENDENT_AMBULATORY_CARE_PROVIDER_SITE_OTHER): Payer: Medicare Other | Admitting: Cardiology

## 2013-02-04 ENCOUNTER — Encounter: Payer: Self-pay | Admitting: Cardiology

## 2013-02-04 VITALS — BP 120/66 | HR 52 | Ht 72.0 in | Wt 204.8 lb

## 2013-02-04 DIAGNOSIS — I472 Ventricular tachycardia: Secondary | ICD-10-CM

## 2013-02-04 DIAGNOSIS — I10 Essential (primary) hypertension: Secondary | ICD-10-CM

## 2013-02-04 DIAGNOSIS — I739 Peripheral vascular disease, unspecified: Secondary | ICD-10-CM

## 2013-02-04 DIAGNOSIS — I255 Ischemic cardiomyopathy: Secondary | ICD-10-CM

## 2013-02-04 DIAGNOSIS — E785 Hyperlipidemia, unspecified: Secondary | ICD-10-CM

## 2013-02-04 DIAGNOSIS — I200 Unstable angina: Secondary | ICD-10-CM

## 2013-02-04 DIAGNOSIS — I2589 Other forms of chronic ischemic heart disease: Secondary | ICD-10-CM

## 2013-02-04 DIAGNOSIS — I2510 Atherosclerotic heart disease of native coronary artery without angina pectoris: Secondary | ICD-10-CM

## 2013-02-04 LAB — REMOTE ICD DEVICE
BRDY-0002RV: 50 {beats}/min
BRDY-0002RV: 50 {beats}/min
CHARGE TIME: 10.19 s
CHARGE TIME: 10.19 s
DEV-0020ICD: NEGATIVE
RV LEAD IMPEDENCE ICD: 680 Ohm
RV LEAD IMPEDENCE ICD: 680 Ohm
TOT-0006: 20140121000000
TZAT-0001FASTVT: 1
TZAT-0001FASTVT: 1
TZAT-0001SLOWVT: 1
TZAT-0001SLOWVT: 1
TZAT-0001SLOWVT: 2
TZAT-0001SLOWVT: 2
TZAT-0004FASTVT: 8
TZAT-0004SLOWVT: 8
TZAT-0004SLOWVT: 8
TZAT-0005FASTVT: 88 pct
TZAT-0011SLOWVT: 10 ms
TZAT-0011SLOWVT: 10 ms
TZAT-0013SLOWVT: 6
TZAT-0018FASTVT: NEGATIVE
TZAT-0018SLOWVT: NEGATIVE
TZAT-0018SLOWVT: NEGATIVE
TZAT-0019FASTVT: 8 V
TZAT-0019FASTVT: 8 V
TZAT-0019SLOWVT: 8 V
TZAT-0019SLOWVT: 8 V
TZAT-0020FASTVT: 1.6 ms
TZAT-0020SLOWVT: 1.6 ms
TZAT-0020SLOWVT: 1.6 ms
TZAT-0020SLOWVT: 1.6 ms
TZAT-0020SLOWVT: 1.6 ms
TZON-0005SLOWVT: 16
TZON-0008FASTVT: 0 ms
TZON-0008SLOWVT: 0 ms
TZON-0011AFLUTTER: 70
TZST-0001FASTVT: 2
TZST-0001FASTVT: 2
TZST-0001FASTVT: 4
TZST-0001FASTVT: 6
TZST-0001FASTVT: 6
TZST-0001SLOWVT: 3
TZST-0001SLOWVT: 3
TZST-0001SLOWVT: 4
TZST-0001SLOWVT: 5
TZST-0001SLOWVT: 5
TZST-0003FASTVT: 30 J
TZST-0003FASTVT: 30 J
TZST-0003FASTVT: 35 J
TZST-0003FASTVT: 35 J
TZST-0003FASTVT: 35 J
TZST-0003FASTVT: 35 J
TZST-0003FASTVT: 35 J
TZST-0003SLOWVT: 20 J
TZST-0003SLOWVT: 20 J
TZST-0003SLOWVT: 30 J
TZST-0003SLOWVT: 35 J

## 2013-02-04 NOTE — Progress Notes (Signed)
[name] Date of Birth: 01/11/1941   History of Present Illness: [name] is seen for followup today. He reports he is doing very well. He denies any symptoms of dizziness or tachycardia. He's had no chest pain or shortness of breath. His weight has been stable at home and he has had no edema. He is scheduled for complete lab work with his primary care at the end of this week. 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  Current Outpatient Prescriptions on File Prior to Visit  Medication Sig Dispense Refill  . amiodarone (PACERONE) 400 MG tablet Take 0.5-1 tablets (200-400 mg total) by mouth See admin instructions. Take 400 mg 5 days a week and 200 mg 2 days a week on Weds and Sat.  60 tablet  6  . aspirin 81 MG tablet Take 81 mg by mouth daily.       [name] atorvastatin (LIPITOR) 80 MG tablet Take 80 mg by mouth daily.       [name] CALCIUM-MAGNESIUM-ZINC PO Take 3 tablets by mouth at bedtime. 1000 mg calcium, 400 mg magnesium, 25 mg zinc      . carvedilol (COREG) 6.25 MG tablet Take 1 tablet (6.25 mg total) by mouth 2 (two) times daily with a meal.  60 tablet  11  . Chlorpheniramine-Acetaminophen (CORICIDIN HBP COLD/FLU PO) Take 2 tablets by mouth every 4 (four) hours as needed. Cold symptoms      . clopidogrel (PLAVIX) 75 MG tablet Take 75 mg by mouth daily.       . furosemide (LASIX) 20 MG tablet Take 20 mg by mouth daily.       [name] levothyroxine (SYNTHROID) 112 MCG tablet Take 1 tablet (112 mcg total) by mouth daily.      . Multiple Vitamin (MULTIVITAMIN) tablet Take 1 tablet by mouth daily.       . Multiple Vitamins-Minerals (EYE VITAMINS PO) Take by mouth 2 (two) times daily.        [name] NIASPAN 500 MG CR tablet Take 1,500 mg by mouth daily.       . nitroGLYCERIN (NITROSTAT) 0.4 MG SL tablet Place 0.4 mg under the tongue every 5 (five) minutes as needed. For chest pain      . ramipril (ALTACE) 5 MG capsule Take 5 mg by mouth daily.         No current facility-administered medications on  file prior to visit.    Allergies  Allergen Reactions  . Meperidine Hcl Other (See Comments)    "CAN'T MOVE" CATATONIC PER PT.    Past Medical History  Diagnosis Date  . CAD (coronary artery disease)     s/p MI 1982;  s/p DES to CFX 2011;  s/p NSTEMI 4/12 in setting of VTach;  cath 7/12:  LM ok, LAD mid 30-40%, Dx's with 40%, mCFX stent patent, prox to mid RCA occluded with R to R and L to R collats.  Medical Tx was continued  . Bradycardia   . Ventricular tachycardia     s/p AICD;   h/o ICD shock;  Amiodarone Rx. S/P ablation in 2012  . 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 HTN (hypertension)   . HLD (hyperlipidemia)   . Cytopenia   . Hypothyroidism   . Renal insufficiency   .  Claudication   . 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 Chronic systolic heart failure   . Ischemic cardiomyopathy     echo 7/12:  EF 25-30%, mild AI, mod MR, mod LAE  . TIA (transient ischemic attack)   . CVD (cerebrovascular disease)     left CEA 2008  . Arthritis   . Stroke   . Anemia     Past Surgical History  Procedure Laterality Date  . Femoral-popliteal bypass graft  2011  . Cardiac defibrillator placement      Medtronic  . Revascularization / in-situ graft leg    . Renal artery bypass    . Back surgery    . Lumbar laminectomy    . Knee surgery      bilateral  . Elbow surgery      right  . Cholecystectomy    . Lipoma excision      scalp  . Bone spur      heel  . Cataract extraction      bilateral  . Carotid stent      left  . Carotid endarterectomy    . Doppler echocardiography  2011    History  Smoking status  . Former Smoker -- 30 years  . Types: Cigarettes  . Quit date: 11/05/2001  Smokeless tobacco  . Never Used    History  Alcohol Use No    Family History  Problem Relation Age of Onset  . Coronary artery disease    .  Heart attack Mother   . Coronary artery disease Mother   . Coronary artery disease Father   . Stroke Father     Review of Systems: As noted in history of present illness. All other systems were reviewed and are negative.  Physical Exam: BP 120/66  Pulse 52  Ht 6' (1.829 m)  Wt 204 lb 12.8 oz (92.897 kg)  BMI 27.77 kg/m2  SpO2 98% He is a pleasant white male in no acute distress. HEENT exam is unremarkable. Neck is without JVD, adenopathy, thyromegaly, or bruits. Lungs are clear. Cardiac exam reveals an irregular rhythm without gallop or murmur. Abdomen is soft and nontender. He has bilateral femoral bruits. He has chronic trace right lower extremity edema. Pedal pulses are good. He is alert and oriented x3. Cranial nerves II through XII are intact.  LABORATORY DATA:   Assessment / Plan: 1. Chronic systolic CHF. EF 25-30% Well compensated on exam. Continue current doses of Altace and carvedilol. Up titration limited by hypotension in the past.  2. Ventricular tachycardia status post ICD implant. He is on chronic amiodarone therapy. Recent device interrogation demonstrated 5 episodes of ventricular tachycardia that were pace terminated. He will followup with Dr. Graciela Husbands in the device clinic in May. Followup lab work with LFTs and TSH later this week with his primary care.  3. PAD was stable claudication.  4. CAD with remote myocardial infarction 1982. Status post stenting of the left circumflex coronary in 2011 with DES. Cardiac catheterization 2012 showed patent stent. He has a chronic occlusion of the right coronary with collaterals. He is asymptomatic on medical therapy.

## 2013-02-04 NOTE — Patient Instructions (Signed)
Continue your current therapy  I will see you in 6 months.   

## 2013-02-19 ENCOUNTER — Encounter: Payer: Self-pay | Admitting: *Deleted

## 2013-03-23 ENCOUNTER — Encounter: Payer: Self-pay | Admitting: Vascular Surgery

## 2013-03-24 ENCOUNTER — Other Ambulatory Visit (INDEPENDENT_AMBULATORY_CARE_PROVIDER_SITE_OTHER): Payer: Medicare Other | Admitting: *Deleted

## 2013-03-24 ENCOUNTER — Encounter (INDEPENDENT_AMBULATORY_CARE_PROVIDER_SITE_OTHER): Payer: Medicare Other | Admitting: *Deleted

## 2013-03-24 ENCOUNTER — Encounter: Payer: Self-pay | Admitting: Vascular Surgery

## 2013-03-24 ENCOUNTER — Ambulatory Visit (INDEPENDENT_AMBULATORY_CARE_PROVIDER_SITE_OTHER): Payer: Medicare Other | Admitting: Vascular Surgery

## 2013-03-24 DIAGNOSIS — Z48812 Encounter for surgical aftercare following surgery on the circulatory system: Secondary | ICD-10-CM

## 2013-03-24 DIAGNOSIS — I739 Peripheral vascular disease, unspecified: Secondary | ICD-10-CM

## 2013-03-24 DIAGNOSIS — I6529 Occlusion and stenosis of unspecified carotid artery: Secondary | ICD-10-CM

## 2013-03-24 NOTE — Progress Notes (Signed)
Subjective:     Patient ID: [name], male   DOB: 1941-05-19, 72 y.o.   MRN: [id number]  HPI this 72 year old male well-known to me has had previous aortobifemoral bypass graft, bilateral femoral-popliteal bypass grafts, left carotid endarterectomy, and an aortorenal bypass. He has done very well. He denies any claudication symptoms being able to ambulate 2-3 miles at day with his work as a Engineer, materials here he denies any chest or abdominal pain. He has had no episodes of lateralizing weakness, aphasia, amaurosis fugax, diplopia, blurred vision, or syncope.  Past Medical History  Diagnosis Date  . CAD (coronary artery disease)     s/p MI 1982;  s/p DES to CFX 2011;  s/p NSTEMI 4/12 in setting of VTach;  cath 7/12:  LM ok, LAD mid 30-40%, Dx's with 40%, mCFX stent patent, prox to mid RCA occluded with R to R and L to R collats.  Medical Tx was continued  . Bradycardia   . Ventricular tachycardia     s/p AICD;   h/o ICD shock;  Amiodarone Rx. S/P ablation in 2012  . 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 HTN (hypertension)   . HLD (hyperlipidemia)   . Cytopenia   . Hypothyroidism   . Renal insufficiency   . Claudication   . 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 Chronic systolic heart failure   . Ischemic cardiomyopathy     echo 7/12:  EF 25-30%, mild AI, mod MR, mod LAE  . TIA (transient ischemic attack)   . CVD (cerebrovascular disease)     left CEA 2008  . Arthritis   . Stroke   . Anemia     History  Substance Use Topics  . Smoking status: Former Smoker -- 30 years    Types: Cigarettes    Quit date: 11/05/2001  . Smokeless tobacco: Never Used  . Alcohol Use: 0.0 oz/week     Comment: occasional    Family History  Problem Relation Age of Onset  . Coronary artery disease    . Heart attack Mother   . Coronary artery disease  Mother   . Coronary artery disease Father   . Stroke Father     Allergies  Allergen Reactions  . Meperidine Hcl Other (See Comments)    "CAN'T MOVE" CATATONIC PER PT.    Current outpatient prescriptions:amiodarone (PACERONE) 400 MG tablet, Take 0.5-1 tablets (200-400 mg total) by mouth See admin instructions. Take 400 mg 5 days a week and 200 mg 2 days a week on Weds and Sat., Disp: 60 tablet, Rfl: 6;  aspirin 81 MG tablet, Take 81 mg by mouth daily. , Disp: , Rfl: ;  atorvastatin (LIPITOR) 80 MG tablet, Take 80 mg by mouth daily. , Disp: , Rfl:  CALCIUM-MAGNESIUM-ZINC PO, Take 3 tablets by mouth at bedtime. 1000 mg calcium, 400 mg magnesium, 25 mg zinc, Disp: , Rfl: ;  carvedilol (COREG) 6.25 MG tablet, Take 1 tablet (6.25 mg total) by mouth 2 (two) times daily with a meal., Disp: 60 tablet, Rfl: 11;  clopidogrel (PLAVIX) 75 MG tablet, Take 75 mg by mouth daily. , Disp: , Rfl: ;  furosemide (LASIX) 20 MG tablet, Take 20 mg by mouth daily. , Disp: , Rfl:  levothyroxine (SYNTHROID) 112 MCG tablet,  Take 1 tablet (112 mcg total) by mouth daily., Disp: , Rfl: ;  loteprednol (LOTEMAX) 0.2 % SUSP, Take 1 drop by mouth daily., Disp: , Rfl: ;  Multiple Vitamin (MULTIVITAMIN) tablet, Take 1 tablet by mouth daily. , Disp: , Rfl: ;  Multiple Vitamins-Minerals (EYE VITAMINS PO), Take by mouth 2 (two) times daily.  , Disp: , Rfl: ;  NIASPAN 500 MG CR tablet, Take 1,500 mg by mouth daily. , Disp: , Rfl:  nitroGLYCERIN (NITROSTAT) 0.4 MG SL tablet, Place 0.4 mg under the tongue every 5 (five) minutes as needed. For chest pain, Disp: , Rfl: ;  Omega-3 Fatty Acids (FISH OIL) 1200 MG CAPS, Take 1 capsule by mouth daily., Disp: , Rfl: ;  ramipril (ALTACE) 5 MG capsule, Take 5 mg by mouth daily.  , Disp: , Rfl:  Chlorpheniramine-Acetaminophen (CORICIDIN HBP COLD/FLU PO), Take 2 tablets by mouth every 4 (four) hours as needed. Cold symptoms, Disp: , Rfl:   BP 138/66  Pulse 54  Ht 6' (1.829 m)  Wt 205 lb (92.987 kg)   BMI 27.8 kg/m2  SpO2 100%  Body mass index is 27.8 kg/(m^2).         Review of Systems denies chest pain, dyspnea on exertion, PND, orthopnea, chronic bronchitis, hemoptysis, claudication-all systems negative complete review of     Objective:   Physical Exam blood pressure 138/66 heart rate 54 respirations 18 Gen.-alert and oriented x3 in no apparent distress HEENT normal for age Lungs no rhonchi or wheezing Cardiovascular regular rhythm no murmurs carotid pulses 3+ palpable no bruits audible Abdomen soft nontender no palpable masses Musculoskeletal free of  major deformities Skin clear -no rashes Neurologic normal Lower extremities 3+ femoral and dorsalis pedis pulses palpable bilaterally with no edema  Data ordered or chair E. duplex scan of both femoral-popliteal grafts and ABIs all of which was normal. Also ordered carotid duplex exam which I reviewed and interpreted. There is no evidence of flow reduction in the right carotid artery or the left carotid endarterectomy site       Assessment:     Doing well post bilateral lower extremity revascularization and carotid endarterectomy on the left as well as a renal artery bypass many years ago currently asymptomatic    Plan:     Return in one year for carotid duplex exam, duplex scan bilateral femoral-popliteal grafts, bilateral ABIs.

## 2013-03-25 NOTE — Addendum Note (Signed)
Addended by: Sharee Pimple on: 03/25/2013 09:09 AM   Modules accepted: Orders

## 2013-04-02 ENCOUNTER — Telehealth: Payer: Self-pay | Admitting: Internal Medicine

## 2013-04-02 ENCOUNTER — Ambulatory Visit (INDEPENDENT_AMBULATORY_CARE_PROVIDER_SITE_OTHER): Payer: Medicare Other | Admitting: Internal Medicine

## 2013-04-02 ENCOUNTER — Encounter: Payer: Self-pay | Admitting: Internal Medicine

## 2013-04-02 VITALS — BP 140/76 | HR 58 | Ht 72.0 in | Wt 203.8 lb

## 2013-04-02 DIAGNOSIS — T82198A Other mechanical complication of other cardiac electronic device, initial encounter: Secondary | ICD-10-CM

## 2013-04-02 DIAGNOSIS — I472 Ventricular tachycardia: Secondary | ICD-10-CM

## 2013-04-02 DIAGNOSIS — I2510 Atherosclerotic heart disease of native coronary artery without angina pectoris: Secondary | ICD-10-CM

## 2013-04-02 LAB — ICD DEVICE OBSERVATION
BATTERY VOLTAGE: 2.64 V
BRDY-0002RV: 50 {beats}/min
CHARGE TIME: 11.25 s
DEV-0020ICD: NEGATIVE
RV LEAD AMPLITUDE: 3.6 mv
RV LEAD THRESHOLD: 1 V
TZAT-0001FASTVT: 1
TZAT-0011SLOWVT: 10 ms
TZAT-0011SLOWVT: 10 ms
TZAT-0012FASTVT: 200 ms
TZAT-0012SLOWVT: 200 ms
TZAT-0012SLOWVT: 200 ms
TZAT-0013FASTVT: 1
TZAT-0013SLOWVT: 6
TZAT-0013SLOWVT: 6
TZAT-0018FASTVT: NEGATIVE
TZAT-0018SLOWVT: NEGATIVE
TZAT-0018SLOWVT: NEGATIVE
TZAT-0019SLOWVT: 8 V
TZAT-0019SLOWVT: 8 V
TZAT-0020FASTVT: 1.6 ms
TZAT-0020SLOWVT: 1.6 ms
TZAT-0020SLOWVT: 1.6 ms
TZON-0003SLOWVT: 500 ms
TZON-0005SLOWVT: 16
TZON-0008SLOWVT: 0 ms
TZON-0011AFLUTTER: 70
TZST-0001FASTVT: 2
TZST-0001FASTVT: 4
TZST-0001FASTVT: 5
TZST-0001SLOWVT: 4
TZST-0001SLOWVT: 5
TZST-0003FASTVT: 35 J
TZST-0003FASTVT: 35 J
TZST-0003FASTVT: 35 J
TZST-0003SLOWVT: 20 J
TZST-0003SLOWVT: 30 J
TZST-0003SLOWVT: 35 J
VENTRICULAR PACING ICD: 13 pct

## 2013-04-02 NOTE — Progress Notes (Signed)
Patient Care Team: [name] as PCP - General (Orthopedic Surgery)   HPI  [name] is a 72 y.o. male Seen in followup for ventricular tachycardia occurning in the setting of ischemic heart disease with prior revascularization and prior MI  He had clinical recurrent VT in the summer 2012 associated with a decrease in his amiodarone dose. This  resulted in ICD discharge  Catheterization at that time demonstrated Double-vessel coronary artery disease with Patent stent in the mid circumflex artery and Chronic total occlusion of the mid right coronary artery    Ejection fraction of 25-30% with moderate MR  Scan--large inferior wall infarct with severely depressed LV function and ejection fraction of 24%   Because of recurrent VT underwent catheter ablation November 2012 . The VT was localized to the scar border on the inferior wall  of the left ventricle about halfway from base to apex.Following ablation, it was no longer inducible. He did have 2 other nonclinical VTs  He been doing quite well. He was seen at the [facility] emergency room on Tuesday because of an episode of cloudy vision associated with a slow heart rate which prompted both EMS and there emergency room to give him atropine. The first was given without monitoring apparently in the second was given with monitoring. I should note that the lower rate of his pacemaker is set at 50 beats per minute; he does have PVCs.  He currently has a cold       Past Medical History  Diagnosis Date  . CAD (coronary artery disease)     s/p MI 1982;  s/p DES to CFX 2011;  s/p NSTEMI 4/12 in setting of VTach;  cath 7/12:  LM ok, LAD mid 30-40%, Dx's with 40%, mCFX stent patent, prox to mid RCA occluded with R to R and L to R collats.  Medical Tx was continued  . Bradycardia   . Ventricular tachycardia     s/p AICD;   h/o ICD shock;  Amiodarone Rx. S/P ablation in 2012  . PVD (peripheral vascular disease)     h/o claudication;  s/p R fem to  pop BPG 3/11;  s/p L CFA BPG 7/03;  s/p repair of inf AAA 6/03;  s/p aorta to bilat renal BPG 6/03  . HTN (hypertension)   . HLD (hyperlipidemia)   . Cytopenia   . Hypothyroidism   . Renal insufficiency   . Claudication   . 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 Chronic systolic heart failure   . Ischemic cardiomyopathy     echo 7/12:  EF 25-30%, mild AI, mod MR, mod LAE  . TIA (transient ischemic attack)   . CVD (cerebrovascular disease)     left CEA 2008  . Arthritis   . Stroke   . Anemia     Past Surgical History  Procedure Laterality Date  . Femoral-popliteal bypass graft  2011  . Cardiac defibrillator placement      Medtronic  . Revascularization / in-situ graft leg    . Renal artery bypass    . Back surgery    . Lumbar laminectomy    . Knee surgery      bilateral  . Elbow surgery      right  . Cholecystectomy    . Lipoma excision      scalp  . Bone spur      heel  . Cataract extraction      bilateral  . Carotid stent  left  . Carotid endarterectomy    . Doppler echocardiography  2011    Current Outpatient Prescriptions  Medication Sig Dispense Refill  . amiodarone (PACERONE) 400 MG tablet Take 0.5-1 tablets (200-400 mg total) by mouth See admin instructions. Take 400 mg 5 days a week and 200 mg 2 days a week on Weds and Sat.  60 tablet  6  . aspirin 81 MG tablet Take 81 mg by mouth daily.       [name] atorvastatin (LIPITOR) 80 MG tablet Take 80 mg by mouth daily.       [name] CALCIUM-MAGNESIUM-ZINC PO Take 3 tablets by mouth at bedtime. 1000 mg calcium, 400 mg magnesium, 25 mg zinc      . carvedilol (COREG) 6.25 MG tablet Take 1 tablet (6.25 mg total) by mouth 2 (two) times daily with a meal.  60 tablet  11  . Chlorpheniramine-Acetaminophen (CORICIDIN HBP COLD/FLU PO) Take 2 tablets by mouth every 4 (four) hours as needed. Cold symptoms      . clopidogrel (PLAVIX) 75 MG tablet Take 75 mg by mouth daily.       . furosemide (LASIX) 20 MG tablet Take 20 mg by  mouth daily.       [name] levothyroxine (SYNTHROID) 112 MCG tablet Take 1 tablet (112 mcg total) by mouth daily.      [name] loteprednol (LOTEMAX) 0.2 % SUSP Take 1 drop by mouth daily.      . Multiple Vitamin (MULTIVITAMIN) tablet Take 1 tablet by mouth daily.       . Multiple Vitamins-Minerals (EYE VITAMINS PO) Take by mouth 2 (two) times daily.        [name] NIASPAN 500 MG CR tablet Take 1,500 mg by mouth daily.       . nitroGLYCERIN (NITROSTAT) 0.4 MG SL tablet Place 0.4 mg under the tongue every 5 (five) minutes as needed. For chest pain      . Omega-3 Fatty Acids (FISH OIL) 1200 MG CAPS Take 1 capsule by mouth daily.      . ramipril (ALTACE) 5 MG capsule Take 5 mg by mouth daily.         No current facility-administered medications for this visit.    Allergies  Allergen Reactions  . Meperidine Hcl Other (See Comments)    "CAN'T MOVE" CATATONIC PER PT.    Review of Systems negative except from HPI and PMH  Physical Exam There were no vitals taken for this visit. Well developed and well nourished in no acute distress HENT normal E scleral and icterus clear Neck Supple JVP flat; carotids brisk and full Clear to ausculation  this is a so as to what is a and in a in aRegular rate and rhythm, no murmurs gallops or rub Soft with active bowel sounds No clubbing cyanosis none Edema Alert and oriented, grossly normal motor and sensory function Skin Warm and Dry    Assessment and  Plan   VT  athe patient has had no recurrent ventricular tachycardia.  Device at this time demonstrated inappropriate therapy for sinus rhythm. Please let appropriately identified rhythm is SVT however, this is a monitoring motor and therapy was delivered. The device has been reprogrammed to activity this SVT discriminator  ICD  As above The patient's device was interrogated and the information was fully reviewed.  The device was reprogrammed As noted.  CHF-chronic systolic -euvolemic. Will continue current  medications  ICM without symptoms. Continue current medications.  bradyrhythmic events not clear what happens.  Bradycardia can be postulated in the setting of a pacemaker with a lower rate of 50 with PVCs; however, if the patient were on monitor that shoulkd have been identified. We will obtain records and review them.

## 2013-04-02 NOTE — Patient Instructions (Signed)

## 2013-04-02 NOTE — Telephone Encounter (Signed)
ROI Faxed to [facility] @ [phone] 04/02/13/km

## 2013-04-27 ENCOUNTER — Ambulatory Visit (INDEPENDENT_AMBULATORY_CARE_PROVIDER_SITE_OTHER): Payer: Medicare Other | Admitting: *Deleted

## 2013-04-27 ENCOUNTER — Telehealth: Payer: Self-pay | Admitting: Internal Medicine

## 2013-04-27 DIAGNOSIS — I2589 Other forms of chronic ischemic heart disease: Secondary | ICD-10-CM

## 2013-04-27 DIAGNOSIS — Z95810 Presence of automatic (implantable) cardiac defibrillator: Secondary | ICD-10-CM

## 2013-04-27 DIAGNOSIS — I255 Ischemic cardiomyopathy: Secondary | ICD-10-CM

## 2013-04-27 NOTE — Telephone Encounter (Signed)
Spoke w/pt and pt to send [facility] transmission this pm. Advised would return call as soon as received/kwm

## 2013-04-27 NOTE — Telephone Encounter (Signed)
Transmission received. Device reached ERI. Pt scheduled with [facility] for 05-11-13 @ 900.

## 2013-04-27 NOTE — Telephone Encounter (Signed)
New Problem  Pt states his low battery indication went off at 7 am. He wants to know what to do

## 2013-04-30 LAB — REMOTE ICD DEVICE
BATTERY VOLTAGE: 2.64 V
DEV-0020ICD: NEGATIVE
RV LEAD AMPLITUDE: 4.4 mv
TOT-0006: 20140529000000
TZAT-0001FASTVT: 1
TZAT-0001SLOWVT: 1
TZAT-0001SLOWVT: 2
TZAT-0004SLOWVT: 8
TZAT-0004SLOWVT: 8
TZAT-0005FASTVT: 88 pct
TZAT-0005SLOWVT: 75 pct
TZAT-0005SLOWVT: 84 pct
TZAT-0011FASTVT: 10 ms
TZAT-0013SLOWVT: 6
TZAT-0013SLOWVT: 6
TZAT-0020SLOWVT: 1.6 ms
TZAT-0020SLOWVT: 1.6 ms
TZON-0003FASTVT: 280 ms
TZON-0003SLOWVT: 500 ms
TZON-0004SLOWVT: 48
TZON-0008FASTVT: 0 ms
TZON-0011AFLUTTER: 70
TZST-0001FASTVT: 2
TZST-0001FASTVT: 3
TZST-0001FASTVT: 5
TZST-0001SLOWVT: 3
TZST-0001SLOWVT: 5
TZST-0001SLOWVT: 6
TZST-0003FASTVT: 30 J
TZST-0003FASTVT: 35 J
TZST-0003FASTVT: 35 J
TZST-0003SLOWVT: 20 J
TZST-0003SLOWVT: 30 J
TZST-0003SLOWVT: 35 J

## 2013-05-05 ENCOUNTER — Telehealth: Payer: Self-pay | Admitting: Internal Medicine

## 2013-05-05 NOTE — Telephone Encounter (Signed)
Records Rec From [facility] gave to [facility] 05/05/13/KM

## 2013-05-07 ENCOUNTER — Encounter: Payer: Self-pay | Admitting: *Deleted

## 2013-05-11 ENCOUNTER — Ambulatory Visit (INDEPENDENT_AMBULATORY_CARE_PROVIDER_SITE_OTHER): Payer: Medicare Other | Admitting: Internal Medicine

## 2013-05-11 ENCOUNTER — Encounter: Payer: Self-pay | Admitting: Internal Medicine

## 2013-05-11 VITALS — BP 97/60 | HR 50 | Ht 72.0 in | Wt 200.0 lb

## 2013-05-11 DIAGNOSIS — I472 Ventricular tachycardia, unspecified: Secondary | ICD-10-CM

## 2013-05-11 DIAGNOSIS — I255 Ischemic cardiomyopathy: Secondary | ICD-10-CM

## 2013-05-11 DIAGNOSIS — R001 Bradycardia, unspecified: Secondary | ICD-10-CM | POA: Insufficient documentation

## 2013-05-11 DIAGNOSIS — I2589 Other forms of chronic ischemic heart disease: Secondary | ICD-10-CM

## 2013-05-11 DIAGNOSIS — Z4502 Encounter for adjustment and management of automatic implantable cardiac defibrillator: Secondary | ICD-10-CM | POA: Insufficient documentation

## 2013-05-11 DIAGNOSIS — I498 Other specified cardiac arrhythmias: Secondary | ICD-10-CM

## 2013-05-11 LAB — ICD DEVICE OBSERVATION
DEV-0020ICD: NEGATIVE
FVT: 0
RV LEAD AMPLITUDE: 3.9 mv
TZAT-0001SLOWVT: 1
TZAT-0001SLOWVT: 2
TZAT-0005SLOWVT: 75 pct
TZAT-0005SLOWVT: 84 pct
TZAT-0011FASTVT: 10 ms
TZAT-0011SLOWVT: 10 ms
TZAT-0012FASTVT: 200 ms
TZAT-0013FASTVT: 1
TZAT-0013SLOWVT: 6
TZAT-0013SLOWVT: 6
TZAT-0018FASTVT: NEGATIVE
TZAT-0018SLOWVT: NEGATIVE
TZAT-0018SLOWVT: NEGATIVE
TZAT-0019FASTVT: 8 V
TZON-0003FASTVT: 280 ms
TZON-0004SLOWVT: 48
TZON-0005SLOWVT: 16
TZON-0008FASTVT: 0 ms
TZST-0001FASTVT: 3
TZST-0001FASTVT: 6
TZST-0001SLOWVT: 3
TZST-0001SLOWVT: 4
TZST-0001SLOWVT: 6
TZST-0003FASTVT: 35 J
TZST-0003FASTVT: 35 J
TZST-0003SLOWVT: 30 J
TZST-0003SLOWVT: 35 J
VENTRICULAR PACING ICD: 27 pct
VF: 0

## 2013-05-11 LAB — CBC WITH DIFFERENTIAL/PLATELET
Eosinophils Relative: 5.1 % — ABNORMAL HIGH (ref 0.0–5.0)
Lymphocytes Relative: 16.3 % (ref 12.0–46.0)
MCV: 103 fl — ABNORMAL HIGH (ref 78.0–100.0)
Monocytes Absolute: 0.8 10*3/uL (ref 0.1–1.0)
Neutrophils Relative %: 66 % (ref 43.0–77.0)
Platelets: 121 10*3/uL — ABNORMAL LOW (ref 150.0–400.0)
RBC: 3.85 Mil/uL — ABNORMAL LOW (ref 4.22–5.81)
WBC: 6.4 10*3/uL (ref 4.5–10.5)

## 2013-05-11 LAB — BASIC METABOLIC PANEL
BUN: 32 mg/dL — ABNORMAL HIGH (ref 6–23)
Calcium: 9.1 mg/dL (ref 8.4–10.5)
Chloride: 106 mEq/L (ref 96–112)
Creatinine, Ser: 2 mg/dL — ABNORMAL HIGH (ref 0.4–1.5)
GFR: 35.34 mL/min — ABNORMAL LOW (ref >60.00)

## 2013-05-11 LAB — PROTIME-INR
INR: 1.2 ratio — ABNORMAL HIGH (ref 0.8–1.0)
Prothrombin Time: 12.6 s — ABNORMAL HIGH (ref 10.2–12.4)

## 2013-05-11 NOTE — Assessment & Plan Note (Signed)
The patient has sinus bradycardia. It is not clear how problematic it is. We'll undertake stress testing to assess for chronotropic competence.

## 2013-05-11 NOTE — Assessment & Plan Note (Signed)
The patient's device has reached ERI We have reviewed the benefits and risks of generator replacement.  These include but are not limited to lead fracture and infection.  The patient understands, agrees and is willing to proceed. The other issue is whether his sinus bradycardia is sufficient to justify device upgrade. He has no clearly attributable symptoms. We'll undertake stress Myoview scanning to assess for chronotropic competence

## 2013-05-11 NOTE — Patient Instructions (Signed)
**Note De-Identified Damiean Lukes Obfuscation** Your physician has requested that you have en exercise stress myoview. For further information please visit [email or web address] Please follow instruction sheet, as given.  Your physician recommends that you have procedure to replace generator in your ICD devise  Your physician recommends that you return for lab work in: today  Your physician recommends that you schedule a follow-up appointment in: after Generator change out

## 2013-05-11 NOTE — Assessment & Plan Note (Signed)
Continue current medications; we'll undertake Myoview scanning prior to device generator replacement as it has been more than 5 years

## 2013-05-11 NOTE — Assessment & Plan Note (Addendum)
No intercurrent Ventricular tachycardia Will furtehr decrease amiodarone  200 5X week

## 2013-05-11 NOTE — Progress Notes (Signed)
Patient Care Team: [name] as PCP - General (Orthopedic Surgery)   HPI  Frank MUSCATELLO is a 72 y.o. male  Seen in followup for ventricular tachycardia occurning in the setting of ischemic heart disease with prior revascularization and prior MI. He had clinical recurrent VT in the summer 2012 associated with a decrease in his amiodarone dose. This resulted in ICD discharge  Catheterization at that time demonstrated Double-vessel coronary artery disease with Patent stent in the mid circumflex artery and Chronic total occlusion of the mid right coronary artery  Ejection fraction of 25-30% with moderate MR  Scan--large inferior wall infarct with severely depressed LV function and ejection fraction of 24%  Because of recurrent VT underwent catheter ablation November 2012 . The VT was localized to the scar border on the inferior wall of the left ventricle about halfway from base to apex. Following ablation, it was no longer inducible. He did have 2 other nonclinical VTs  He been doing quite well.  He has had inappropriate therapy for sinus tach and SVT discriminators were activated  He also has had symptomatic bradycardia below lower pacing rate prob 2/2 PVCs  The outside records from [facility] were reviewed; they were unilluminating  His device has reached ERI. He has had bradycardia in the past. Currently he has no functional complaints. There is no chest pain shortness of breath. He does have some chronic mild asymmetric edema.     Past Medical History  Diagnosis Date  . CAD (coronary artery disease)     s/p MI 1982;  s/p DES to CFX 2011;  s/p NSTEMI 4/12 in setting of VTach;  cath 7/12:  LM ok, LAD mid 30-40%, Dx's with 40%, mCFX stent patent, prox to mid RCA occluded with R to R and L to R collats.  Medical Tx was continued  . Bradycardia   . Ventricular tachycardia     s/p AICD;   h/o ICD shock;  Amiodarone Rx. S/P ablation in 2012  . PVD (peripheral vascular disease)    h/o claudication;  s/p R fem to pop BPG 3/11;  s/p L CFA BPG 7/03;  s/p repair of inf AAA 6/03;  s/p aorta to bilat renal BPG 6/03  . HTN (hypertension)   . HLD (hyperlipidemia)   . Cytopenia   . Hypothyroidism   . Renal insufficiency   . Claudication   . 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 Chronic systolic heart failure   . Ischemic cardiomyopathy     echo 7/12:  EF 25-30%, mild AI, mod MR, mod LAE  . TIA (transient ischemic attack)   . CVD (cerebrovascular disease)     left CEA 2008  . Arthritis   . Stroke   . Anemia   . Inappropriate therapy from implantable cardioverter-defibrillator 04/02/2013    ATP for sinus tach  SVT wavelet identified SVT but was no  passive     Past Surgical History  Procedure Laterality Date  . Femoral-popliteal bypass graft  2011  . Cardiac defibrillator placement      [employer]tronic  . Revascularization / in-situ graft leg    . Renal artery bypass    . Back surgery    . Lumbar laminectomy    . Knee surgery      bilateral  . Elbow surgery      right  . Cholecystectomy    . Lipoma excision      scalp  . Bone spur  heel  . Cataract extraction      bilateral  . Carotid stent      left  . Carotid endarterectomy    . Doppler echocardiography  2011    Current Outpatient Prescriptions  Medication Sig Dispense Refill  . amiodarone (PACERONE) 400 MG tablet Take 0.5-1 tablets (200-400 mg total) by mouth See admin instructions. Take 400 mg 5 days a week and 200 mg 2 days a week on Weds and Sat.  60 tablet  6  . aspirin 81 MG tablet Take 81 mg by mouth daily.       [name] atorvastatin (LIPITOR) 80 MG tablet Take 80 mg by mouth daily.       [name] CALCIUM-MAGNESIUM-ZINC PO Take 3 tablets by mouth at bedtime. 1000 mg calcium, 400 mg magnesium, 25 mg zinc      . carvedilol (COREG) 6.25 MG tablet Take 1 tablet (6.25 mg total) by mouth 2 (two) times daily with a meal.  60 tablet  11  . Chlorpheniramine-Acetaminophen (CORICIDIN HBP COLD/FLU PO) Take 2  tablets by mouth every 4 (four) hours as needed. Cold symptoms      . clopidogrel (PLAVIX) 75 MG tablet Take 75 mg by mouth daily.       . fexofenadine (ALLEGRA) 180 MG tablet Take 180 mg by mouth daily. 1 tab daily      . furosemide (LASIX) 20 MG tablet Take 20 mg by mouth daily.       [name] levothyroxine (SYNTHROID) 112 MCG tablet Take 1 tablet (112 mcg total) by mouth daily.      [name] loteprednol (LOTEMAX) 0.2 % SUSP Take 1 drop by mouth daily.      . Multiple Vitamin (MULTIVITAMIN) tablet Take 1 tablet by mouth daily.       . Multiple Vitamins-Minerals (EYE VITAMINS PO) Take by mouth 2 (two) times daily.        [name] NIASPAN 500 MG CR tablet Take 1,500 mg by mouth daily.       . nitroGLYCERIN (NITROSTAT) 0.4 MG SL tablet Place 0.4 mg under the tongue every 5 (five) minutes as needed. For chest pain      . Omega-3 Fatty Acids (FISH OIL) 1200 MG CAPS Take 1 capsule by mouth daily.      . ramipril (ALTACE) 5 MG capsule Take 5 mg by mouth daily.         No current facility-administered medications for this visit.    Allergies  Allergen Reactions  . Meperidine Hcl Other (See Comments)    "CAN'T MOVE" CATATONIC PER PT.    Review of Systems negative except from HPI and PMH  Physical Exam BP 97/60  Pulse 50  Ht 6' (1.829 m)  Wt 200 lb (90.719 kg)  BMI 27.12 kg/m2 Well developed and well nourished in no acute distress HENT normal E scleral and icterus clear Neck Supple JVP flat; carotids brisk and full Clear to ausculation  Regular rate and rhythm, no murmurs gallops or rub Soft with active bowel sounds No clubbing cyanosis right>left  Edema Alert and oriented, grossly normal motor and sensory function Skin Warm and Dry  ECG demonstrates sinus rhythm at 50 Intervals 21/14/52 Axis assessment 7 Prior inferior wall MI  Assessment and  Plan

## 2013-05-12 ENCOUNTER — Other Ambulatory Visit: Payer: Self-pay

## 2013-05-13 ENCOUNTER — Encounter (HOSPITAL_COMMUNITY): Payer: Self-pay | Admitting: Pharmacy Technician

## 2013-05-14 ENCOUNTER — Ambulatory Visit (HOSPITAL_COMMUNITY): Payer: Medicare Other | Attending: Cardiovascular Disease | Admitting: Radiology

## 2013-05-14 VITALS — BP 120/64 | Ht 70.0 in | Wt 201.0 lb

## 2013-05-14 DIAGNOSIS — I6529 Occlusion and stenosis of unspecified carotid artery: Secondary | ICD-10-CM | POA: Insufficient documentation

## 2013-05-14 DIAGNOSIS — R001 Bradycardia, unspecified: Secondary | ICD-10-CM

## 2013-05-14 DIAGNOSIS — Z95810 Presence of automatic (implantable) cardiac defibrillator: Secondary | ICD-10-CM | POA: Insufficient documentation

## 2013-05-14 DIAGNOSIS — I2510 Atherosclerotic heart disease of native coronary artery without angina pectoris: Secondary | ICD-10-CM

## 2013-05-14 DIAGNOSIS — R55 Syncope and collapse: Secondary | ICD-10-CM | POA: Insufficient documentation

## 2013-05-14 DIAGNOSIS — Z4502 Encounter for adjustment and management of automatic implantable cardiac defibrillator: Secondary | ICD-10-CM

## 2013-05-14 DIAGNOSIS — R0602 Shortness of breath: Secondary | ICD-10-CM

## 2013-05-14 DIAGNOSIS — I739 Peripheral vascular disease, unspecified: Secondary | ICD-10-CM | POA: Insufficient documentation

## 2013-05-14 DIAGNOSIS — I472 Ventricular tachycardia: Secondary | ICD-10-CM

## 2013-05-14 DIAGNOSIS — I252 Old myocardial infarction: Secondary | ICD-10-CM | POA: Insufficient documentation

## 2013-05-14 DIAGNOSIS — Z8673 Personal history of transient ischemic attack (TIA), and cerebral infarction without residual deficits: Secondary | ICD-10-CM | POA: Insufficient documentation

## 2013-05-14 DIAGNOSIS — I10 Essential (primary) hypertension: Secondary | ICD-10-CM | POA: Insufficient documentation

## 2013-05-14 DIAGNOSIS — Z9861 Coronary angioplasty status: Secondary | ICD-10-CM | POA: Insufficient documentation

## 2013-05-14 DIAGNOSIS — I255 Ischemic cardiomyopathy: Secondary | ICD-10-CM

## 2013-05-14 DIAGNOSIS — I428 Other cardiomyopathies: Secondary | ICD-10-CM | POA: Insufficient documentation

## 2013-05-14 MED ORDER — TECHNETIUM TC 99M SESTAMIBI GENERIC - CARDIOLITE
11.0000 | Freq: Once | INTRAVENOUS | Status: AC | PRN
  Administered 2013-05-14: 11 via INTRAVENOUS

## 2013-05-14 MED ORDER — REGADENOSON 0.4 MG/5ML IV SOLN
0.4000 mg | Freq: Once | INTRAVENOUS | Status: AC
  Administered 2013-05-14: 0.4 mg via INTRAVENOUS

## 2013-05-14 MED ORDER — TECHNETIUM TC 99M SESTAMIBI GENERIC - CARDIOLITE
33.0000 | Freq: Once | INTRAVENOUS | Status: AC | PRN
  Administered 2013-05-14: 33 via INTRAVENOUS

## 2013-05-14 NOTE — Progress Notes (Signed)
[facility] NUCLEAR MED [address] [place], [place] [address] [phone]    Cardiology Nuclear Med Study  [name] is a 72 y.o. male     MRN : [id number]     DOB: 01-08-1941  Procedure Date: 05/14/2013  Nuclear Med Background Indication for Stress Test:  Evaluation for Ischemia, Pending Surgical Clearance: Assess chronotropic competence prior to device generator replacement 05/25/13, and Stent Patency History:  1/12'Ablation;Cardiomyopathy/ICM;Defibrillator;1/14 Heart Catheterization, patent stent,chronic occ,rca with coll-cont med tx;Myocardial Infarction;08' Myocardial Perfusion Study,lg inferior-apical defect. No ischemia. EF=30%;Stents Lcfx;VT Cardiac Risk Factors: Carotid Disease, CVA, Hypertension, Lipids and PVD  Symptoms:  Syncope   Nuclear Pre-Procedure Caffeine/Decaff Intake:  None NPO After: 10:00pm   Lungs:  clear O2 Sat: 98% on room air. IV 0.9% NS with Angio Cath:  20g  IV Site: R Antecubital  IV Started by:  Stanton Kidney, EMT-P  Chest Size (in):  42 Cup Size: n/a  Height: 5\' 10"  (1.778 m)  Weight:  201 lb (91.173 kg)  BMI:  Body mass index is 28.84 kg/(m^2). Tech Comments:  Coreg Held >24 hrs, per patient. Pt intialy began walking stress, within  the three minute mark pt unable to complete due to SOB, pt switched to low-level lexiscan. Pt completed test.Pt went into a LBBB for app 2 min, converted to original rhythm.     Nuclear Med Study 1 or 2 day study: 1 day  Stress Test Type:  Treadmill/Lexiscan  Reading MD: Kristeen Miss, MD  Order Authorizing Provider:  Ferman Hamming  Resting Radionuclide: Technetium 38m Sestamibi  Resting Radionuclide Dose: 11.0 mCi   Stress Radionuclide:  Technetium 46m Sestamibi  Stress Radionuclide Dose: 33.0 mCi           Stress Protocol Rest HR: 50 Stress HR: 108  Rest BP: 120/64 Stress BP: 152/108  Exercise Time (min): n/a METS: n/a   Predicted Max HR: 19 bpm % Max HR: 568.42 bpm Rate Pressure  Product: 21308   Dose of Adenosine (mg):  n/a Dose of Lexiscan: 0.4 mg  Dose of Atropine (mg): n/a Dose of Dobutamine: n/a mcg/kg/min (at max HR)  Stress Test Technologist: Frederick Peers, EMT-P  Nuclear Technologist:  Leonia Corona, RT-N     Rest Procedure:  Myocardial perfusion imaging was performed at rest 45 minutes following the intravenous administration of Technetium 53m Sestamibi. Rest ECG: NSR with NS IVCD.    Stress Procedure:  The patient received IV Lexiscan 0.4 mg over 15-seconds with concurrent low level exercise and then Technetium 51m Sestamibi was injected at 30-seconds while the patient continued walking one more minute.  Quantitative spect images were obtained after a 45-minute delay. Stress ECG: No significant change from baseline ECG and The patient developed a LBBB with exercise.  the patient was given Lexiscan with low level exercise  for the stress injection.   QPS Raw Data Images:  Normal; no motion artifact; normal heart/lung ratio. Stress Images:  There is a large severe defect in the entire inferior wall with normal uptake in the anterior wall and lateral wall.      Rest Images:  There is a large severe defect in the entire inferior wall with normal uptake in the anterior wall and lateral wall.  Subtraction (SDS):  No evidence of ischemia. Transient Ischemic Dilatation (Normal <1.22):  n/a Lung/Heart Ratio (Normal <0.45):  0.49  Quantitative Gated Spect Images QGS EDV:  232 ml QGS ESV:  151 ml  Impression Exercise Capacity:  Lexiscan with  low level exercise. BP Response:  Normal blood pressure response. Clinical Symptoms:  No significant symptoms noted. ECG Impression:  No significant ST segment change suggestive of ischemia.  The patient developed LBBB with exercise.  Comparison with Prior Nuclear Study: No images to compare  Overall Impression:  Low risk stress nuclear study   The large Inferior MI has been seen on previous studies.  .  There is no evidence  of ischemia.   LV Ejection Fraction: 35%.  LV Wall Motion:  There is marked hypokinesis of the inferior wall with global moderate hypokinesis in the anterior and lateral walls.    Vesta Mixer, [name]., MD, [facility] 05/14/2013, 4:40 PM Office - [phone] Pager [phone]

## 2013-05-25 ENCOUNTER — Ambulatory Visit (HOSPITAL_COMMUNITY)
Admission: RE | Admit: 2013-05-25 | Discharge: 2013-05-26 | Disposition: A | Discharge status: Home / Self Care | Payer: Medicare Other | Source: Ambulatory Visit | Attending: Internal Medicine | Admitting: Internal Medicine

## 2013-05-25 ENCOUNTER — Encounter (HOSPITAL_COMMUNITY): Payer: Self-pay | Admitting: General Practice

## 2013-05-25 ENCOUNTER — Encounter (HOSPITAL_COMMUNITY)
Admission: RE | Disposition: A | Discharge status: Home / Self Care | Payer: Self-pay | Source: Ambulatory Visit | Attending: Internal Medicine

## 2013-05-25 DIAGNOSIS — R001 Bradycardia, unspecified: Secondary | ICD-10-CM

## 2013-05-25 DIAGNOSIS — Z9889 Other specified postprocedural states: Secondary | ICD-10-CM

## 2013-05-25 DIAGNOSIS — I252 Old myocardial infarction: Secondary | ICD-10-CM | POA: Insufficient documentation

## 2013-05-25 DIAGNOSIS — I5022 Chronic systolic (congestive) heart failure: Secondary | ICD-10-CM | POA: Insufficient documentation

## 2013-05-25 DIAGNOSIS — I259 Chronic ischemic heart disease, unspecified: Secondary | ICD-10-CM | POA: Insufficient documentation

## 2013-05-25 DIAGNOSIS — Z9861 Coronary angioplasty status: Secondary | ICD-10-CM | POA: Insufficient documentation

## 2013-05-25 DIAGNOSIS — Z79899 Other long term (current) drug therapy: Secondary | ICD-10-CM | POA: Insufficient documentation

## 2013-05-25 DIAGNOSIS — I2510 Atherosclerotic heart disease of native coronary artery without angina pectoris: Secondary | ICD-10-CM | POA: Insufficient documentation

## 2013-05-25 DIAGNOSIS — Z95810 Presence of automatic (implantable) cardiac defibrillator: Secondary | ICD-10-CM

## 2013-05-25 DIAGNOSIS — I472 Ventricular tachycardia, unspecified: Secondary | ICD-10-CM | POA: Insufficient documentation

## 2013-05-25 DIAGNOSIS — I2582 Chronic total occlusion of coronary artery: Secondary | ICD-10-CM | POA: Insufficient documentation

## 2013-05-25 DIAGNOSIS — Z4502 Encounter for adjustment and management of automatic implantable cardiac defibrillator: Secondary | ICD-10-CM

## 2013-05-25 DIAGNOSIS — I739 Peripheral vascular disease, unspecified: Secondary | ICD-10-CM

## 2013-05-25 DIAGNOSIS — I4729 Other ventricular tachycardia: Secondary | ICD-10-CM | POA: Insufficient documentation

## 2013-05-25 DIAGNOSIS — I10 Essential (primary) hypertension: Secondary | ICD-10-CM

## 2013-05-25 DIAGNOSIS — I255 Ischemic cardiomyopathy: Secondary | ICD-10-CM

## 2013-05-25 DIAGNOSIS — E785 Hyperlipidemia, unspecified: Secondary | ICD-10-CM

## 2013-05-25 DIAGNOSIS — Z7982 Long term (current) use of aspirin: Secondary | ICD-10-CM | POA: Insufficient documentation

## 2013-05-25 DIAGNOSIS — N259 Disorder resulting from impaired renal tubular function, unspecified: Secondary | ICD-10-CM

## 2013-05-25 HISTORY — DX: Malignant melanoma of unspecified ear and external auricular canal: C43.20

## 2013-05-25 HISTORY — DX: Sleep apnea, unspecified: G47.30

## 2013-05-25 HISTORY — DX: Presence of automatic (implantable) cardiac defibrillator: Z95.810

## 2013-05-25 HISTORY — PX: IMPLANTABLE CARDIOVERTER DEFIBRILLATOR GENERATOR CHANGE: SHX5474

## 2013-05-25 LAB — BASIC METABOLIC PANEL
BUN: 18 mg/dL (ref 6–23)
Calcium: 8.7 mg/dL (ref 8.4–10.5)
GFR calc Af Amer: 54 mL/min — ABNORMAL LOW (ref >90)
GFR calc non Af Amer: 46 mL/min — ABNORMAL LOW (ref >90)
Glucose, Bld: 112 mg/dL — ABNORMAL HIGH (ref 70–99)
Potassium: 4.4 mEq/L (ref 3.5–5.1)
Sodium: 134 mEq/L — ABNORMAL LOW (ref 135–145)

## 2013-05-25 LAB — POCT I-STAT, CHEM 8
Calcium, Ion: 1.2 mmol/L (ref 1.13–1.30)
Chloride: 105 mEq/L (ref 96–112)
HCT: 36 % — ABNORMAL LOW (ref 39.0–52.0)
Hemoglobin: 12.2 g/dL — ABNORMAL LOW (ref 13.0–17.0)
TCO2: 23 mmol/L (ref 0–100)

## 2013-05-25 LAB — SURGICAL PCR SCREEN: MRSA, PCR: NEGATIVE

## 2013-05-25 SURGERY — IMPLANTABLE CARDIOVERTER DEFIBRILLATOR GENERATOR CHANGE
Anesthesia: LOCAL

## 2013-05-25 MED ORDER — LIDOCAINE HCL (PF) 1 % IJ SOLN
INTRAMUSCULAR | Status: AC
  Filled 2013-05-25: qty 60

## 2013-05-25 MED ORDER — CHLORHEXIDINE GLUCONATE 4 % EX LIQD
60.0000 mL | Freq: Once | CUTANEOUS | Status: DC
  Filled 2013-05-25: qty 60

## 2013-05-25 MED ORDER — CLOPIDOGREL BISULFATE 75 MG PO TABS
75.0000 mg | ORAL_TABLET | Freq: Every day | ORAL | Status: DC
  Administered 2013-05-26: 75 mg via ORAL
  Filled 2013-05-25: qty 1

## 2013-05-25 MED ORDER — CEFAZOLIN SODIUM 1-5 GM-% IV SOLN
1.0000 g | Freq: Four times a day (QID) | INTRAVENOUS | Status: AC
  Administered 2013-05-25 – 2013-05-26 (×3): 1 g via INTRAVENOUS
  Filled 2013-05-25 (×3): qty 50

## 2013-05-25 MED ORDER — CEFAZOLIN SODIUM-DEXTROSE 2-3 GM-% IV SOLR
2.0000 g | INTRAVENOUS | Status: DC
  Filled 2013-05-25: qty 50

## 2013-05-25 MED ORDER — HEPARIN (PORCINE) IN NACL 2-0.9 UNIT/ML-% IJ SOLN
INTRAMUSCULAR | Status: AC
  Filled 2013-05-25: qty 500

## 2013-05-25 MED ORDER — MUPIROCIN 2 % EX OINT
TOPICAL_OINTMENT | CUTANEOUS | Status: AC
  Filled 2013-05-25: qty 22

## 2013-05-25 MED ORDER — CARVEDILOL 6.25 MG PO TABS
6.2500 mg | ORAL_TABLET | Freq: Two times a day (BID) | ORAL | Status: DC
  Administered 2013-05-25 – 2013-05-26 (×2): 6.25 mg via ORAL
  Filled 2013-05-25 (×4): qty 1

## 2013-05-25 MED ORDER — ACETAMINOPHEN 325 MG PO TABS
325.0000 mg | ORAL_TABLET | ORAL | Status: DC | PRN
  Administered 2013-05-25 – 2013-05-26 (×2): 650 mg via ORAL
  Filled 2013-05-25 (×2): qty 2

## 2013-05-25 MED ORDER — ASPIRIN EC 81 MG PO TBEC
81.0000 mg | DELAYED_RELEASE_TABLET | Freq: Every day | ORAL | Status: DC
  Administered 2013-05-26: 81 mg via ORAL
  Filled 2013-05-25: qty 1

## 2013-05-25 MED ORDER — AMIODARONE HCL 200 MG PO TABS: 400.0000 mg | ORAL_TABLET | Freq: Every day | ORAL | Status: DC

## 2013-05-25 MED ORDER — MIDAZOLAM HCL 5 MG/5ML IJ SOLN
INTRAMUSCULAR | Status: AC
  Filled 2013-05-25: qty 5

## 2013-05-25 MED ORDER — AMIODARONE HCL 200 MG PO TABS
400.0000 mg | ORAL_TABLET | Freq: Every day | ORAL | Status: DC
  Administered 2013-05-26: 400 mg via ORAL
  Filled 2013-05-25: qty 2

## 2013-05-25 MED ORDER — ATORVASTATIN CALCIUM 80 MG PO TABS
80.0000 mg | ORAL_TABLET | Freq: Every day | ORAL | Status: DC
  Administered 2013-05-25 – 2013-05-26 (×2): 80 mg via ORAL
  Filled 2013-05-25 (×2): qty 1

## 2013-05-25 MED ORDER — FUROSEMIDE 20 MG PO TABS
20.0000 mg | ORAL_TABLET | Freq: Every day | ORAL | Status: DC
  Administered 2013-05-26: 20 mg via ORAL
  Filled 2013-05-25 (×2): qty 1

## 2013-05-25 MED ORDER — RAMIPRIL 5 MG PO CAPS
5.0000 mg | ORAL_CAPSULE | Freq: Every day | ORAL | Status: DC
  Administered 2013-05-26: 5 mg via ORAL
  Filled 2013-05-25: qty 1

## 2013-05-25 MED ORDER — NITROGLYCERIN 0.4 MG SL SUBL: 0.4000 mg | SUBLINGUAL_TABLET | SUBLINGUAL | Status: DC | PRN

## 2013-05-25 MED ORDER — ONDANSETRON HCL 4 MG/2ML IJ SOLN
4.0000 mg | Freq: Four times a day (QID) | INTRAMUSCULAR | Status: DC | PRN
  Filled 2013-05-25: qty 2

## 2013-05-25 MED ORDER — SODIUM CHLORIDE 0.9 % IR SOLN
80.0000 mg | Status: DC
  Filled 2013-05-25: qty 2

## 2013-05-25 MED ORDER — LEVOTHYROXINE SODIUM 112 MCG PO TABS
112.0000 ug | ORAL_TABLET | Freq: Every day | ORAL | Status: DC
  Administered 2013-05-26: 112 ug via ORAL
  Filled 2013-05-25 (×2): qty 1

## 2013-05-25 MED ORDER — SODIUM CHLORIDE 0.9 % IV SOLN: INTRAVENOUS | Status: AC

## 2013-05-25 MED ORDER — ASPIRIN 81 MG PO TABS: 81.0000 mg | ORAL_TABLET | Freq: Every day | ORAL | Status: DC

## 2013-05-25 MED ORDER — SODIUM CHLORIDE 0.9 % IV SOLN
INTRAVENOUS | Status: DC
  Administered 2013-05-25: 07:00:00 via INTRAVENOUS

## 2013-05-25 MED ORDER — FENTANYL CITRATE 0.05 MG/ML IJ SOLN
INTRAMUSCULAR | Status: AC
  Filled 2013-05-25: qty 2

## 2013-05-25 MED ORDER — MUPIROCIN 2 % EX OINT
TOPICAL_OINTMENT | Freq: Two times a day (BID) | CUTANEOUS | Status: DC
  Administered 2013-05-25 (×2): 1 via NASAL
  Filled 2013-05-25: qty 22

## 2013-05-25 NOTE — Progress Notes (Signed)
UR Completed Giang Hemme Graves-Bigelow, RN,BSN [phone]  

## 2013-05-25 NOTE — CV Procedure (Signed)
[name] [id number]  [id number]  Preop Dx: symptomatic bradycardia 2/2 drug therapy-irreversible, VT, ICM and prev ICD Postop Dx same/   Procedure:atrial lead insertion ICD generator replaceemnt  Cx: None   Dictation number [id number]  Sherryl Manges, MD 05/25/2013 9:05 AM

## 2013-05-25 NOTE — Interval H&P Note (Signed)
History and Physical Interval Note:  05/25/2013 7:23 AM  [name]  has presented today for surgery, with the diagnosis of ERI  The various methods of treatment have been discussed with the patient and family. After consideration of risks, benefits and other options for treatment, the patient has consented to  Procedure(s): IMPLANTABLE CARDIOVERTER DEFIBRILLATOR GENERATOR CHANGE (N/A) as a surgical intervention .  The patient's history has been reviewed, patient examined, no change in status, stable for surgery.  I have reviewed the patient's chart and labs.  Questions were answered to the patient's satisfaction.   We have also discussed lead revision with atrial lead insertion because of symptomatic chronotropic incompetence  Sherryl Manges ICD Criteria  Current LVEF (within 6 months):35%  NYHA Functional Classification: Class III  Heart Failure History:  Yes, Duration of heart failure since onset is > 9 months  Non-Ischemic Dilated Cardiomyopathy History:  No.  Atrial Fibrillation/Atrial Flutter:  No.  Ventricular Tachycardia History:  Yes, Hemodynamic instability present, VT Type:  SVT - Monomorphic.  Cardiac Arrest History:  No  History of Syndromes with Risk of Sudden Death:  No.  Previous ICD:  Yes, ICD Type:  Single, Reason for ICD:  Secondary, reason for secondary prevention:  Ventricular Fibrillation/tachycardia  Electrophysiology Study: No.  Anticoagulation Therapy:  Patient is NOT on anticoagulation therapy.   Beta Blocker Therapy:  Yes.   Ace Inhibitor/ARB Therapy:  Yes.

## 2013-05-25 NOTE — H&P (View-Only) (Signed)
Patient Care Team: Walter Wray as PCP - General (Orthopedic Surgery)   HPI  [name] is a 71 y.o. male  Seen in followup for ventricular tachycardia occurning in the setting of ischemic heart disease with prior revascularization and prior MI. He had clinical recurrent VT in the summer 2012 associated with a decrease in his amiodarone dose. This resulted in ICD discharge  Catheterization at that time demonstrated Double-vessel coronary artery disease with Patent stent in the mid circumflex artery and Chronic total occlusion of the mid right coronary artery  Ejection fraction of 25-30% with moderate MR  Scan--large inferior wall infarct with severely depressed LV function and ejection fraction of 24%  Because of recurrent VT underwent catheter ablation November 2012 . The VT was localized to the scar border on the inferior wall of the left ventricle about halfway from base to apex. Following ablation, it was no longer inducible. He did have 2 other nonclinical VTs  He been doing quite well.  He has had inappropriate therapy for sinus tach and SVT discriminators were activated  He also has had symptomatic bradycardia below lower pacing rate prob 2/2 PVCs  The outside records from [facility] were reviewed; they were unilluminating  His device has reached ERI. He has had bradycardia in the past. Currently he has no functional complaints. There is no chest pain shortness of breath. He does have some chronic mild asymmetric edema.     Past Medical History  Diagnosis Date  . CAD (coronary artery disease)     s/p MI 1982;  s/p DES to CFX 2011;  s/p NSTEMI 4/12 in setting of VTach;  cath 7/12:  LM ok, LAD mid 30-40%, Dx's with 40%, mCFX stent patent, prox to mid RCA occluded with R to R and L to R collats.  Medical Tx was continued  . Bradycardia   . Ventricular tachycardia     s/p AICD;   h/o ICD shock;  Amiodarone Rx. S/P ablation in 2012  . PVD (peripheral vascular disease)    h/o claudication;  s/p R fem to pop BPG 3/11;  s/p L CFA BPG 7/03;  s/p repair of inf AAA 6/03;  s/p aorta to bilat renal BPG 6/03  . HTN (hypertension)   . HLD (hyperlipidemia)   . Cytopenia   . Hypothyroidism   . Renal insufficiency   . Claudication   . 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 Chronic systolic heart failure   . Ischemic cardiomyopathy     echo 7/12:  EF 25-30%, mild AI, mod MR, mod LAE  . TIA (transient ischemic attack)   . CVD (cerebrovascular disease)     left CEA 2008  . Arthritis   . Stroke   . Anemia   . Inappropriate therapy from implantable cardioverter-defibrillator 04/02/2013    ATP for sinus tach  SVT wavelet identified SVT but was no  passive     Past Surgical History  Procedure Laterality Date  . Femoral-popliteal bypass graft  2011  . Cardiac defibrillator placement      Medtronic  . Revascularization / in-situ graft leg    . Renal artery bypass    . Back surgery    . Lumbar laminectomy    . Knee surgery      bilateral  . Elbow surgery      right  . Cholecystectomy    . Lipoma excision      scalp  . Bone spur        heel  . Cataract extraction      bilateral  . Carotid stent      left  . Carotid endarterectomy    . Doppler echocardiography  2011    Current Outpatient Prescriptions  Medication Sig Dispense Refill  . amiodarone (PACERONE) 400 MG tablet Take 0.5-1 tablets (200-400 mg total) by mouth See admin instructions. Take 400 mg 5 days a week and 200 mg 2 days a week on Weds and Sat.  60 tablet  6  . aspirin 81 MG tablet Take 81 mg by mouth daily.       . atorvastatin (LIPITOR) 80 MG tablet Take 80 mg by mouth daily.       . CALCIUM-MAGNESIUM-ZINC PO Take 3 tablets by mouth at bedtime. 1000 mg calcium, 400 mg magnesium, 25 mg zinc      . carvedilol (COREG) 6.25 MG tablet Take 1 tablet (6.25 mg total) by mouth 2 (two) times daily with a meal.  60 tablet  11  . Chlorpheniramine-Acetaminophen (CORICIDIN HBP COLD/FLU PO) Take 2  tablets by mouth every 4 (four) hours as needed. Cold symptoms      . clopidogrel (PLAVIX) 75 MG tablet Take 75 mg by mouth daily.       . fexofenadine (ALLEGRA) 180 MG tablet Take 180 mg by mouth daily. 1 tab daily      . furosemide (LASIX) 20 MG tablet Take 20 mg by mouth daily.       . levothyroxine (SYNTHROID) 112 MCG tablet Take 1 tablet (112 mcg total) by mouth daily.      . loteprednol (LOTEMAX) 0.2 % SUSP Take 1 drop by mouth daily.      . Multiple Vitamin (MULTIVITAMIN) tablet Take 1 tablet by mouth daily.       . Multiple Vitamins-Minerals (EYE VITAMINS PO) Take by mouth 2 (two) times daily.        . NIASPAN 500 MG CR tablet Take 1,500 mg by mouth daily.       . nitroGLYCERIN (NITROSTAT) 0.4 MG SL tablet Place 0.4 mg under the tongue every 5 (five) minutes as needed. For chest pain      . Omega-3 Fatty Acids (FISH OIL) 1200 MG CAPS Take 1 capsule by mouth daily.      . ramipril (ALTACE) 5 MG capsule Take 5 mg by mouth daily.         No current facility-administered medications for this visit.    Allergies  Allergen Reactions  . Meperidine Hcl Other (See Comments)    "CAN'T MOVE" CATATONIC PER PT.    Review of Systems negative except from HPI and PMH  Physical Exam BP 97/60  Pulse 50  Ht 6' (1.829 m)  Wt 200 lb (90.719 kg)  BMI 27.12 kg/m2 Well developed and well nourished in no acute distress HENT normal E scleral and icterus clear Neck Supple JVP flat; carotids brisk and full Clear to ausculation  Regular rate and rhythm, no murmurs gallops or rub Soft with active bowel sounds No clubbing cyanosis right>left  Edema Alert and oriented, grossly normal motor and sensory function Skin Warm and Dry  ECG demonstrates sinus rhythm at 50 Intervals 21/14/52 Axis assessment 7 Prior inferior wall MI  Assessment and  Plan  

## 2013-05-25 NOTE — Discharge Summary (Addendum)
ELECTROPHYSIOLOGY PROCEDURE DISCHARGE SUMMARY    Patient ID: [name],  MRN: [id number], DOB/AGE: 05-09-1941 72 y.o.  Admit date: 05/25/2013 Discharge date: 05/26/2013  Primary Care Physician: Frank Postin, MD Primary Cardiologist: Frank [place], MD  Primary Discharge Diagnosis:  Ischemic cardiomyopathy status post ICD implantation with device at elective replacement indicator and symptomatic bradycardia s/p ICD generator change and insertion of right atrial lead this admission  Secondary Discharge Diagnosis:  1. Coronary artery disease- s/p MI 1982; s/p DES to CFX 2011; s/p NSTEMI 4/12 in setting of VTach; cath 7/12: LM ok, LAD mid 30-40%, Dx's with 40%, mCFX stent patent, prox to mid RCA occluded with R to R and L to R collats. Medical Tx was continued  2.  Ventricular tachycardia- s/p appropriate ICD therapy, RFCA in 2012, on Amiodarone 3.  PVD- h/o claudication; s/p R fem to pop BPG 3/11; s/p L CFA BPG 7/03; s/p repair of inf AAA 6/03; s/p aorta to bilat renal BPG 6/03  4.  Hypertension 5.  Hyperlipidemia 6.  Hypothyroidism  Procedures This Admission:  1.  Defibrillator generator change and implantation of a right atrial lead on 05-25-2013 by Dr [name].  The patient received a MDT Evera ICD with model number 5076 right atrial lead.  The previously implanted model number 6944 right ventricular lead was used.  DFT's were deferred.  There were no early apparent complications. 2.  CXR on 05-26-2013 demonstrated leads in acceptable position with no pneumothorax  Brief HPI: [name] is a 72 year old male with a history of ischemic cardiomyopathy and ventricular tachycardia who is s/p single chamber ICD implant.  His device has reached elective replacement indicator.  He also has symptomatic bradycardia and chronotropic incompetence.  Risks, benefits, and alternatives to ICD gen change and insertion of a right atrial lead were discussed with the patient who wished to  proceed.  Hospital Course:  The patient was admitted and underwent upgrade of his previously implanted single chamber ICD to a dual chamber ICD with details as outlined above.   He was monitored on telemetry overnight which demonstrated atrial pacing with intrinsic ventricular conduction.  Left chest was without hematoma or ecchymosis.  The device was interrogated and found to be functioning normally.  CXR was obtained and demonstrated no pneumothorax status post device implantation.  Wound care, arm mobility, and restrictions were reviewed with the patient.  Dr [name] examined the patient and considered them stable for discharge to home.    Discharge Vitals: Blood pressure 118/74, pulse 98, temperature 98.3 F (36.8 C), temperature source Oral, resp. rate 18, height 6\' 1"  (1.854 m), weight 201 lb (91.173 kg), SpO2 95.00%.   Well developed and nourished in no acute distress HENT normal Neck supple with JVP-flat Clear Wound without hematoma Regular rate and rhythm, no murmurs or gallops Abd-soft with active BS No Clubbing cyanosis edema Skin-warm and dry A & Oriented  Grossly normal sensory and motor function   Labs:   Lab Results  Component Value Date   WBC 6.4 05/11/2013   HGB 12.2* 05/25/2013   HCT 36.0* 05/25/2013   MCV 103.0* 05/11/2013   PLT 121.0* 05/11/2013     Recent Labs Lab 05/25/13 0955  NA 134*  K 4.4  CL 102  CO2 23  BUN 18  CREATININE 1.45*  CALCIUM 8.7  GLUCOSE 112*     Discharge Medications:    Medication List    ASK your doctor about these medications  amiodarone 400 MG tablet  Commonly known as:  PACERONE  Take 200-400 mg by mouth See admin instructions. Take 400 mg 5 days a week and 200 mg 2 days a week on Weds and Sat.     aspirin 81 MG tablet  Take 81 mg by mouth daily.     atorvastatin 80 MG tablet  Commonly known as:  LIPITOR  Take 80 mg by mouth daily.     CALCIUM-MAGNESIUM-ZINC PO  Take 3 tablets by mouth at bedtime. 1000 mg calcium,  400 mg magnesium, 25 mg zinc     carvedilol 6.25 MG tablet  Commonly known as:  COREG  Take 1 tablet (6.25 mg total) by mouth 2 (two) times daily with a meal.     clopidogrel 75 MG tablet  Commonly known as:  PLAVIX  Take 75 mg by mouth daily.     EYE VITAMINS PO  Take 1 capsule by mouth 2 (two) times daily.     fexofenadine 180 MG tablet  Commonly known as:  ALLEGRA  Take 180 mg by mouth daily. 1 tab daily     fexofenadine 180 MG tablet  Commonly known as:  ALLEGRA  Take 180 mg by mouth as needed.     furosemide 20 MG tablet  Commonly known as:  LASIX  Take 20 mg by mouth daily. ON HOLD starting 05/12/13--Take one tablet by mouth daily     levothyroxine 112 MCG tablet  Commonly known as:  SYNTHROID  Take 1 tablet (112 mcg total) by mouth daily.     multivitamin tablet  Take 1 tablet by mouth daily.     niacin 750 MG CR tablet  Commonly known as:  NIASPAN  Take 1,500 mg by mouth daily.     nitroGLYCERIN 0.4 MG SL tablet  Commonly known as:  NITROSTAT  Place 0.4 mg under the tongue every 5 (five) minutes as needed for chest pain. For chest pain     ramipril 5 MG capsule  Commonly known as:  ALTACE  Take 5 mg by mouth daily.        Disposition:   Future Appointments Provider Department Dept Phone   06/04/2013 11:00 AM [facility] [employer] Main [facility] [place]) [phone]   03/30/2014 1:00 PM [facility] [phone]   03/30/2014 2:00 PM [facility] [phone]   03/30/2014 2:30 PM Pryor Ochoa, MD [facility] [phone]       Duration of Discharge Encounter: Greater than 30 minutes including physician time.  Signed,

## 2013-05-26 ENCOUNTER — Ambulatory Visit (HOSPITAL_COMMUNITY): Payer: Medicare Other

## 2013-05-26 DIAGNOSIS — I472 Ventricular tachycardia: Secondary | ICD-10-CM

## 2013-05-26 NOTE — Progress Notes (Signed)
Orthopedic Tech Progress Note Patient Details:  [name] 1941/10/09 [id number]  Ortho Devices Type of Ortho Device: Sling immobilizer   Cammer, Mickie Bail 05/26/2013, 10:00 AM

## 2013-05-26 NOTE — Op Note (Signed)
NAMEKIVON, APREA NO.:  [id number]  MEDICAL RECORD NO.:  [id number]  LOCATION:  [id number]                        FACILITY:  [facility]  PHYSICIAN:  Duke Salvia, MD, FACCDATE OF BIRTH:  28-Nov-1940  DATE OF PROCEDURE:  05/25/2013 DATE OF DISCHARGE:                              OPERATIVE REPORT   PREOPERATIVE DIAGNOSIS:  Symptomatic bradycardia and ventricular tachycardia, previously implanted ICD with ischemic cardiomyopathy.  POSTOPERATIVE DIAGNOSIS:  Symptomatic bradycardia and ventricular tachycardia, previously implanted ICD with ischemic cardiomyopathy.  PROCEDURES:  Atrial lead insertion, defibrillator generator replacement.  DESCRIPTION OF PROCEDURE:  Following obtaining informed consent, the patient was brought to the electrophysiology laboratory and placed on the fluoroscopic table in supine position.  After routine prep and drape of the left upper chest, lidocaine was infiltrated along the line of the previous incision and carried down to layer of device pocket using sharp dissection electrocautery.  The pocket was not opened.  Initially, we turned our attention to gain access to the extrathoracic left subclavian vein which was done without difficulty.  A guidewire was placed.  A 7- French sheath was positioned and through this was passed a Medtronic [id number] cm active fixation atrial lead, serial number [id number].  It was manipulated on the right atrial appendage where it was deployed on a couple of occasions with decent P-waves but no pacing below 3 V.  We then placed it on the lateral wall where the bipolar P-wave was 3 with a pace impedance of 836, a threshold 0.5 V at 0.5 milliseconds.  Current of injury was brisk.  There was no diaphragmatic pacing at 10 V.  No pleuritic pain was noted.  This lead was secured to the prepectoral fashion and the defibrillator generator was opened up within the pocket and was explanted.  Interrogation of the previously  implanted 6944 lead demonstrated stable R-wave at 4.5 with a pace impedance of 751, a threshold 0.8 at 0.5.  With these acceptable parameters recorded, the lead was attached into a Medtronic Evera device serial number [id number].  Through the device, bipolar P-wave was 5 with a pace impedance of 475, a threshold 0.75 at 0.4.  The R-wave was 5.4, the pace impedance of 551, threshold 1 V at 0.6.  High proximal coil impedance was 45.  Distal coil impedance was 37.  With these acceptable parameters recorded, the leads and the pulse generator were placed in the pocket and secured to the prepectoral fascia.  The wound was copiously irrigated with antibiotic containing saline solution.  Hemostasis was assured.  Leads and pulse generator were placed in the pocket and secured to the prepectoral fascia.  The wound was closed in 3 layers in normal fashion.  The wound was washed, dried, and a benzoin Steri-Strip dressing was applied.  Needle counts, sponge counts, and instrument counts were correct at the end of procedure according to staff.  The patient tolerated the procedure without apparent complication.     Duke Salvia, MD, [facility]     SCK/MEDQ  D:  05/25/2013  T:  05/26/2013  Job:  [id number]

## 2013-06-04 ENCOUNTER — Inpatient Hospital Stay (HOSPITAL_COMMUNITY)
Admission: AD | Admit: 2013-06-04 | Discharge: 2013-06-09 | DRG: 251 | Disposition: A | Discharge status: Home / Self Care | Payer: Medicare Other | Source: Ambulatory Visit | Attending: Internal Medicine | Admitting: Internal Medicine

## 2013-06-04 ENCOUNTER — Encounter: Payer: Self-pay | Admitting: Internal Medicine

## 2013-06-04 ENCOUNTER — Inpatient Hospital Stay (HOSPITAL_COMMUNITY): Payer: Medicare Other

## 2013-06-04 ENCOUNTER — Ambulatory Visit (INDEPENDENT_AMBULATORY_CARE_PROVIDER_SITE_OTHER): Payer: Medicare Other | Admitting: *Deleted

## 2013-06-04 ENCOUNTER — Encounter (HOSPITAL_COMMUNITY): Payer: Self-pay

## 2013-06-04 DIAGNOSIS — Z9861 Coronary angioplasty status: Secondary | ICD-10-CM

## 2013-06-04 DIAGNOSIS — Z4502 Encounter for adjustment and management of automatic implantable cardiac defibrillator: Secondary | ICD-10-CM

## 2013-06-04 DIAGNOSIS — I472 Ventricular tachycardia, unspecified: Secondary | ICD-10-CM

## 2013-06-04 DIAGNOSIS — I739 Peripheral vascular disease, unspecified: Secondary | ICD-10-CM

## 2013-06-04 DIAGNOSIS — T82198A Other mechanical complication of other cardiac electronic device, initial encounter: Principal | ICD-10-CM | POA: Diagnosis present

## 2013-06-04 DIAGNOSIS — N259 Disorder resulting from impaired renal tubular function, unspecified: Secondary | ICD-10-CM

## 2013-06-04 DIAGNOSIS — Z95810 Presence of automatic (implantable) cardiac defibrillator: Secondary | ICD-10-CM

## 2013-06-04 DIAGNOSIS — I255 Ischemic cardiomyopathy: Secondary | ICD-10-CM

## 2013-06-04 DIAGNOSIS — R001 Bradycardia, unspecified: Secondary | ICD-10-CM

## 2013-06-04 DIAGNOSIS — Z9889 Other specified postprocedural states: Secondary | ICD-10-CM

## 2013-06-04 DIAGNOSIS — I2589 Other forms of chronic ischemic heart disease: Secondary | ICD-10-CM

## 2013-06-04 DIAGNOSIS — Z87891 Personal history of nicotine dependence: Secondary | ICD-10-CM

## 2013-06-04 DIAGNOSIS — I4729 Other ventricular tachycardia: Secondary | ICD-10-CM | POA: Diagnosis present

## 2013-06-04 DIAGNOSIS — E785 Hyperlipidemia, unspecified: Secondary | ICD-10-CM

## 2013-06-04 DIAGNOSIS — N289 Disorder of kidney and ureter, unspecified: Secondary | ICD-10-CM | POA: Diagnosis present

## 2013-06-04 DIAGNOSIS — I10 Essential (primary) hypertension: Secondary | ICD-10-CM

## 2013-06-04 DIAGNOSIS — T82120A Displacement of cardiac electrode, initial encounter: Secondary | ICD-10-CM

## 2013-06-04 DIAGNOSIS — I517 Cardiomegaly: Secondary | ICD-10-CM

## 2013-06-04 DIAGNOSIS — E669 Obesity, unspecified: Secondary | ICD-10-CM | POA: Diagnosis present

## 2013-06-04 DIAGNOSIS — I2510 Atherosclerotic heart disease of native coronary artery without angina pectoris: Secondary | ICD-10-CM

## 2013-06-04 DIAGNOSIS — E039 Hypothyroidism, unspecified: Secondary | ICD-10-CM | POA: Diagnosis present

## 2013-06-04 DIAGNOSIS — I498 Other specified cardiac arrhythmias: Secondary | ICD-10-CM | POA: Diagnosis present

## 2013-06-04 DIAGNOSIS — I252 Old myocardial infarction: Secondary | ICD-10-CM

## 2013-06-04 DIAGNOSIS — Y831 Surgical operation with implant of artificial internal device as the cause of abnormal reaction of the patient, or of later complication, without mention of misadventure at the time of the procedure: Secondary | ICD-10-CM | POA: Diagnosis present

## 2013-06-04 LAB — CBC
HCT: 35.9 % — ABNORMAL LOW (ref 39.0–52.0)
Hemoglobin: 12.3 g/dL — ABNORMAL LOW (ref 13.0–17.0)
MCH: 34.3 pg — ABNORMAL HIGH (ref 26.0–34.0)
MCV: 100 fL (ref 78.0–100.0)
Platelets: 118 10*3/uL — ABNORMAL LOW (ref 150–400)
RBC: 3.59 MIL/uL — ABNORMAL LOW (ref 4.22–5.81)
WBC: 4.9 10*3/uL (ref 4.0–10.5)

## 2013-06-04 LAB — COMPREHENSIVE METABOLIC PANEL
ALT: 35 U/L (ref 0–53)
AST: 42 U/L — ABNORMAL HIGH (ref 0–37)
Albumin: 3.5 g/dL (ref 3.5–5.2)
Calcium: 8.9 mg/dL (ref 8.4–10.5)
Creatinine, Ser: 1.66 mg/dL — ABNORMAL HIGH (ref 0.50–1.35)
GFR calc non Af Amer: 39 mL/min — ABNORMAL LOW (ref >90)
Sodium: 135 mEq/L (ref 135–145)
Total Protein: 6.4 g/dL (ref 6.0–8.3)

## 2013-06-04 LAB — ICD DEVICE OBSERVATION
DEV-0020ICD: NEGATIVE
RV LEAD AMPLITUDE: 11.9 mv
RV LEAD IMPEDENCE ICD: 589 Ohm
RV LEAD THRESHOLD: 0.75 V

## 2013-06-04 LAB — APTT: aPTT: 31 seconds (ref 24–37)

## 2013-06-04 LAB — PROTIME-INR: Prothrombin Time: 16 seconds — ABNORMAL HIGH (ref 11.6–15.2)

## 2013-06-04 LAB — TSH: TSH: 2.961 u[IU]/mL (ref 0.350–4.500)

## 2013-06-04 MED ORDER — SODIUM CHLORIDE 0.9 % IJ SOLN: 10.0000 mL | INTRAMUSCULAR | Status: DC | PRN

## 2013-06-04 MED ORDER — ALPRAZOLAM 0.25 MG PO TABS
0.2500 mg | ORAL_TABLET | Freq: Two times a day (BID) | ORAL | Status: DC | PRN
  Administered 2013-06-04 – 2013-06-06 (×3): 0.25 mg via ORAL
  Filled 2013-06-04 (×3): qty 1

## 2013-06-04 MED ORDER — SODIUM CHLORIDE 0.9 % IV SOLN
INTRAVENOUS | Status: DC
  Administered 2013-06-05: 06:00:00 via INTRAVENOUS

## 2013-06-04 MED ORDER — CHLORHEXIDINE GLUCONATE 4 % EX LIQD
60.0000 mL | Freq: Once | CUTANEOUS | Status: AC
  Administered 2013-06-05: 4 via TOPICAL
  Filled 2013-06-04: qty 15

## 2013-06-04 MED ORDER — SODIUM CHLORIDE 0.9 % IJ SOLN: 3.0000 mL | INTRAMUSCULAR | Status: DC | PRN

## 2013-06-04 MED ORDER — AMIODARONE HCL 200 MG PO TABS: 400.0000 mg | ORAL_TABLET | ORAL | Status: DC

## 2013-06-04 MED ORDER — HYDROCODONE-ACETAMINOPHEN 5-325 MG PO TABS
1.0000 | ORAL_TABLET | ORAL | Status: DC | PRN
  Administered 2013-06-05 – 2013-06-06 (×3): 1 via ORAL
  Administered 2013-06-07 (×2): 2 via ORAL
  Filled 2013-06-04: qty 1
  Filled 2013-06-04 (×2): qty 2
  Filled 2013-06-04 (×2): qty 1

## 2013-06-04 MED ORDER — AMIODARONE LOAD VIA INFUSION
150.0000 mg | Freq: Once | INTRAVENOUS | Status: AC
  Administered 2013-06-04: 150 mg via INTRAVENOUS
  Filled 2013-06-04: qty 83.34

## 2013-06-04 MED ORDER — CLOPIDOGREL BISULFATE 75 MG PO TABS
75.0000 mg | ORAL_TABLET | Freq: Every day | ORAL | Status: DC
  Administered 2013-06-04 – 2013-06-09 (×5): 75 mg via ORAL
  Filled 2013-06-04 (×6): qty 1

## 2013-06-04 MED ORDER — FUROSEMIDE 20 MG PO TABS
20.0000 mg | ORAL_TABLET | Freq: Every day | ORAL | Status: DC
  Administered 2013-06-04 – 2013-06-09 (×6): 20 mg via ORAL
  Filled 2013-06-04 (×6): qty 1

## 2013-06-04 MED ORDER — NITROGLYCERIN 0.4 MG SL SUBL: 0.4000 mg | SUBLINGUAL_TABLET | SUBLINGUAL | Status: DC | PRN

## 2013-06-04 MED ORDER — CEFAZOLIN SODIUM-DEXTROSE 2-3 GM-% IV SOLR
2.0000 g | INTRAVENOUS | Status: AC
Start: 2013-06-04 — End: 2013-06-05
  Administered 2013-06-05: 2 g via INTRAVENOUS
  Filled 2013-06-04: qty 50

## 2013-06-04 MED ORDER — ONE-DAILY MULTI VITAMINS PO TABS: 1.0000 | ORAL_TABLET | Freq: Every day | ORAL | Status: DC

## 2013-06-04 MED ORDER — SODIUM CHLORIDE 0.9 % IV SOLN: 250.0000 mL | INTRAVENOUS | Status: DC | PRN

## 2013-06-04 MED ORDER — LEVOTHYROXINE SODIUM 112 MCG PO TABS
112.0000 ug | ORAL_TABLET | Freq: Every day | ORAL | Status: DC
  Administered 2013-06-06 – 2013-06-09 (×4): 112 ug via ORAL
  Filled 2013-06-04 (×7): qty 1

## 2013-06-04 MED ORDER — ACETAMINOPHEN 325 MG PO TABS: 650.0000 mg | ORAL_TABLET | ORAL | Status: DC | PRN

## 2013-06-04 MED ORDER — ASPIRIN 81 MG PO TABS: 81.0000 mg | ORAL_TABLET | Freq: Every day | ORAL | Status: DC

## 2013-06-04 MED ORDER — HEPARIN SODIUM (PORCINE) 5000 UNIT/ML IJ SOLN
5000.0000 [IU] | Freq: Three times a day (TID) | INTRAMUSCULAR | Status: DC
  Administered 2013-06-04 (×2): 5000 [IU] via SUBCUTANEOUS
  Filled 2013-06-04 (×6): qty 1

## 2013-06-04 MED ORDER — AMIODARONE HCL IN DEXTROSE 360-4.14 MG/200ML-% IV SOLN
30.0000 mg/h | INTRAVENOUS | Status: DC
  Administered 2013-06-04: 60 mg/h via INTRAVENOUS
  Administered 2013-06-05 – 2013-06-08 (×6): 30 mg/h via INTRAVENOUS
  Filled 2013-06-04 (×16): qty 200

## 2013-06-04 MED ORDER — CHLORHEXIDINE GLUCONATE 4 % EX LIQD
60.0000 mL | Freq: Once | CUTANEOUS | Status: AC
  Administered 2013-06-04: 4 via TOPICAL
  Filled 2013-06-04: qty 60

## 2013-06-04 MED ORDER — LORATADINE 10 MG PO TABS
10.0000 mg | ORAL_TABLET | Freq: Every day | ORAL | Status: DC
  Administered 2013-06-04 – 2013-06-08 (×3): 10 mg via ORAL
  Filled 2013-06-04 (×5): qty 1

## 2013-06-04 MED ORDER — ADULT MULTIVITAMIN W/MINERALS CH
1.0000 | ORAL_TABLET | Freq: Every day | ORAL | Status: DC
  Administered 2013-06-04 – 2013-06-08 (×5): 1 via ORAL
  Filled 2013-06-04 (×5): qty 1

## 2013-06-04 MED ORDER — SODIUM CHLORIDE 0.9 % IR SOLN
80.0000 mg | Status: DC
  Filled 2013-06-04: qty 2

## 2013-06-04 MED ORDER — NIACIN ER (ANTIHYPERLIPIDEMIC) 500 MG PO TBCR
1500.0000 mg | EXTENDED_RELEASE_TABLET | Freq: Every day | ORAL | Status: DC
  Administered 2013-06-05 – 2013-06-08 (×4): 1500 mg via ORAL
  Filled 2013-06-04 (×5): qty 3

## 2013-06-04 MED ORDER — SODIUM CHLORIDE 0.9 % IJ SOLN
3.0000 mL | Freq: Two times a day (BID) | INTRAMUSCULAR | Status: DC
  Administered 2013-06-04: 3 mL via INTRAVENOUS

## 2013-06-04 MED ORDER — SODIUM CHLORIDE 0.9 % IJ SOLN
10.0000 mL | Freq: Two times a day (BID) | INTRAMUSCULAR | Status: DC
  Administered 2013-06-04: 10 mL

## 2013-06-04 MED ORDER — ATORVASTATIN CALCIUM 80 MG PO TABS
80.0000 mg | ORAL_TABLET | Freq: Every day | ORAL | Status: DC
  Administered 2013-06-04 – 2013-06-09 (×6): 80 mg via ORAL
  Filled 2013-06-04 (×6): qty 1

## 2013-06-04 MED ORDER — ONDANSETRON HCL 4 MG/2ML IJ SOLN
4.0000 mg | Freq: Four times a day (QID) | INTRAMUSCULAR | Status: DC | PRN
  Administered 2013-06-05 – 2013-06-08 (×9): 4 mg via INTRAVENOUS
  Filled 2013-06-04 (×9): qty 2

## 2013-06-04 MED ORDER — ASPIRIN 81 MG PO CHEW
81.0000 mg | CHEWABLE_TABLET | Freq: Every day | ORAL | Status: DC
  Administered 2013-06-04 – 2013-06-09 (×6): 81 mg via ORAL
  Filled 2013-06-04 (×6): qty 1

## 2013-06-04 MED ORDER — AMIODARONE HCL IN DEXTROSE 360-4.14 MG/200ML-% IV SOLN
60.0000 mg/h | INTRAVENOUS | Status: AC
  Administered 2013-06-04: 60 mg/h via INTRAVENOUS
  Filled 2013-06-04 (×2): qty 200

## 2013-06-04 NOTE — Progress Notes (Signed)
Wound check-ICD in office. 

## 2013-06-04 NOTE — Progress Notes (Signed)
   ELECTROPHYSIOLOGY ROUNDING NOTE    Patient Name: [name] Date of Encounter: 06/04/2013    SUBJECTIVE:  Patient feels well.  No chest pain or shortness of breath.  Currently in sinus rhythm.  Received X-ray report- atrial lead appears to be outside the pericardium.  Echo demonstrates no pericardial effusion.    Filed Vitals:   06/04/13 1234 06/04/13 1300 06/04/13 1400 06/04/13 1500  BP: 105/79 103/77 100/56   Pulse: 103 101 46 48  Temp: 97.6 F (36.4 C)     TempSrc: Oral     Resp:  12 13 13   SpO2: 100% 98% 98% 100%    Radiology/Studies:   Dg Chest Port 1 View 06/04/2013   *RADIOLOGY REPORT*  Clinical Data: Seen ventricular tachycardia ablation.  Evaluate cardiac leads.  No current chest complaints.  PORTABLE CHEST - 1 VIEW  Comparison: 05/26/2013  Findings: A dual lead pacer is in place with the ventricular lead tip stable in the region of the right ventricle. The atrial lead tip projects outside of the cardiac silhouette, worrisome for extracardiac positioning and interim perforation through the atrial wall. No pleural fluid is seen to suggest active leakage.  Heart and mediastinal contours remain within normal limits.  The lung fields demonstrate interval clearing of the interstitial prominence on the prior exam and resolved small right pleural effusion compatible with resolved interstitial edema.  The left cardiophrenic angle is excluded from the film for assessment of residual left pleural effusion.  No pneumothorax is seen.  No focal infiltrates or overt congestive failure is noted.  Bony structures appear intact.  Calcification in the aortic arch is stable.  IMPRESSION: Positioning of the atrial lead appears positioned outside the cardiac silhouette worrisome for extracardiac positioning and interim atrial perforation. These results were discussed in person with Dr. Ward Givens on 06/04/2013 at 3 pm. TThe  Cleared interstitial edema with no worrisome pulmonary findings  identified.   Original Report Authenticated By: Rhodia Albright, M.D.    Discussed with Dr Johney Frame- plan for right atrial lead revision in the morning. Will lead perforation, will hold off on VT ablation for now and start IV Amiodarone to help with VT suppression.  Signed, Gypsy Balsam RN, BSN  Pt with lead perforation.  Will cancel VT ablation and proceed with lead revision. He is stable presently.  Will use IV amiodarone to treat VT for now.  Hillis Range, MD

## 2013-06-04 NOTE — H&P (Signed)
ELECTROPHYSIOLOGY HISTORY AND PHYSICAL    Patient ID: [name] MRN: [id number], DOB/AGE: 04/30/1941 72 y.o.  Admit date: 06/04/2013  Primary Physician: [name], MD Primary Cardiologist: Sherryl Manges, MD  Reason for Admission: VT  HPI:  [name] is a 72 year old male with a past medical history significant for coronary artery disease (prior MI, prior DES stent to circ in 2011), bradycardia, hypertension, hyperlipidemia, hypothyroidism, ischemic cardiomyopathy status post ICD implant in 2003 (upgrade to dual chamber device last week) and ventricular tachycardia status post ablation in 2012 and currently on Amiodarone.    He was seen today in the office for a wound check appt.  His atrial lead was found to have no capture.  CXR prior to discharge demonstrated dislodgement of the atrial lead, but he did have capture on next day check.  He was also found to be in ventricular tachycardia which could be successfully pace terminated but had early recurrence.    Because of incessant ventricular tachycardia, he is being admitted with plans for ablation.   The patient denies chest pain, shortness of breath, palpitations, syncope, or pre-syncope.    ROS is negative except as outlined above.   Past Medical History  Diagnosis Date  . CAD (coronary artery disease)     s/p MI 1982;  s/p DES to CFX 2011;  s/p NSTEMI 4/12 in setting of VTach;  cath 7/12:  LM ok, LAD mid 30-40%, Dx's with 40%, mCFX stent patent, prox to mid RCA occluded with R to R and L to R collats.  Medical Tx was continued  . Bradycardia   . Ventricular tachycardia     s/p AICD;   h/o ICD shock;  Amiodarone Rx. S/P ablation in 2012  . 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 HTN (hypertension)   . HLD (hyperlipidemia)   . Cytopenia   . Hypothyroidism   . Renal insufficiency     . Claudication   . Chronic systolic heart failure   . Ischemic cardiomyopathy     echo 7/12:  EF 25-30%, mild AI, mod MR, mod LAE  . TIA (transient ischemic attack) 2001  . CVD (cerebrovascular disease)     left CEA 2008  . Stroke   . Anemia   . Inappropriate therapy from implantable cardioverter-defibrillator 04/02/2013    ATP for sinus tach  SVT wavelet identified SVT but was no  passive   . Myocardial infarction 1992  . Anginal pain   . Automatic implantable cardioverter-defibrillator in situ   . Sleep apnea     denies wearing mask (05/25/2013)  . Arthritis     "a little in my knees" (05/25/2013)  . Melanoma of ear 2013    "near left ear" (05/25/2013)     Surgical History:  Past Surgical History  Procedure Laterality Date  . Femoral-popliteal bypass graft Right 2011  . Cardiac defibrillator placement  2003; 2005; 05/25/2013    Medtronic "recalled in 2005 and replaced, replaced w/one that paces too"  . Revascularization / in-situ graft leg    . Renal artery bypass Bilateral 2003  . Back surgery    . Lumbar disc surgery  1974; 2000's  . Knee arthroscopy Bilateral 1990's  . Elbow arthroscopy Right 1990's  . Cholecystectomy  12/15/?2010  .  Lipoma excision Left 2013    "near ear" (05/25/2013)  . Heel spur surgery Right 1990's  . Cataract extraction w/ intraocular lens  implant, bilateral Bilateral 2000  . Carotid endarterectomy Left ~ 2007  . Doppler echocardiography  2011  . Tonsillectomy  1946  . Femoral-popliteal bypass graft Left 05/2002    Hattie Perch 05/06/2002 (05/25/2013)  . Tumor excision Left 1960's    "fatty tumor" (05/25/2013)  . Cardiac electrophysiology study and ablation  2012  . Coronary angioplasty  1992  . Cardiac catheterization      "several" (05/25/2013)  . Coronary angioplasty with stent placement      "last one was 11/2012  (05/25/2013)     Prescriptions prior to admission  Medication Sig Dispense Refill  . amiodarone (PACERONE) 400 MG tablet Take 200-400  mg by mouth See admin instructions. Take 400 mg 5 days a week and 200 mg 2 days a week on Weds and Sat.      [name] aspirin 81 MG tablet Take 81 mg by mouth daily.       [name] atorvastatin (LIPITOR) 80 MG tablet Take 80 mg by mouth daily.       [name] CALCIUM-MAGNESIUM-ZINC PO Take 3 tablets by mouth at bedtime. 1000 mg calcium, 400 mg magnesium, 25 mg zinc      . carvedilol (COREG) 6.25 MG tablet Take 1 tablet (6.25 mg total) by mouth 2 (two) times daily with a meal.  60 tablet  11  . clopidogrel (PLAVIX) 75 MG tablet Take 75 mg by mouth daily.       . fexofenadine (ALLEGRA) 180 MG tablet Take 180 mg by mouth daily. 1 tab daily      . furosemide (LASIX) 20 MG tablet Take 20 mg by mouth daily.   1 tablet    . levothyroxine (SYNTHROID) 112 MCG tablet Take 1 tablet (112 mcg total) by mouth daily.      . Multiple Vitamin (MULTIVITAMIN) tablet Take 1 tablet by mouth daily.       . Multiple Vitamins-Minerals (EYE VITAMINS PO) Take 1 capsule by mouth 2 (two) times daily.       . niacin (NIASPAN) 750 MG CR tablet Take 1,500 mg by mouth daily.      . nitroGLYCERIN (NITROSTAT) 0.4 MG SL tablet Place 0.4 mg under the tongue every 5 (five) minutes as needed for chest pain. For chest pain      . ramipril (ALTACE) 5 MG capsule Take 5 mg by mouth daily.         Inpatient Medications:   Allergies:  Allergies  Allergen Reactions  . Meperidine Hcl Other (See Comments)    "CAN'T MOVE" CATATONIC PER PT.    History   Social History  . Marital Status: Divorced    Spouse Name: N/A    Number of Children: 2  . Years of Education: N/A   Occupational History  . retired     [employer]  . Retired     [employer]  . Engineer, structural at [employer]   Social History Main Topics  . Smoking status: Former Smoker -- 0.50 packs/day for 37 years    Types: Cigarettes    Quit date: 11/05/2001  . Smokeless tobacco: Never Used  . Alcohol Use: 0.0 oz/week     Comment: 05/25/2013 "mixed drink couple  times/month"  . Drug Use: No  . Sexually Active: No   Other Topics Concern  . Not on file  Social History Narrative   Lives with sig other.     Family History  Problem Relation Age of Onset  . Coronary artery disease    . Heart attack Mother   . Coronary artery disease Mother   . Coronary artery disease Father   . Stroke Father     Physical Exam: Filed Vitals:   06/04/13 1234  BP: 105/79  Pulse: 103  Temp: 97.6 F (36.4 C)  TempSrc: Oral  SpO2: 100%    GEN- The patient is well appearing, alert and oriented x 3 today.   Head- normocephalic, atraumatic Eyes-  Sclera clear, conjunctiva pink Ears- hearing intact Oropharynx- clear Neck- supple, no JVP Lymph- no cervical lymphadenopathy Lungs- Clear to ausculation bilaterally, normal work of breathing Heart- Regular rate and rhythm, no murmurs, rubs or gallops, PMI not laterally displaced GI- soft, NT, ND, + BS Extremities- no clubbing, cyanosis, or edema MS- no significant deformity or atrophy Skin- no rash or lesion Psych- euthymic mood, full affect Neuro- strength and sensation are intact   Labs:   Pending   Radiology/Studies: Dg Chest 2 View 05/26/2013   *RADIOLOGY REPORT*  Clinical Data:  Post pacemaker placement  CHEST - 2 VIEW  Comparison: 11/23/2012  Findings: Left subclavian AICD leads project at right atrium and right ventricle. Mild enlargement of cardiac silhouette. Atherosclerotic calcification aorta. Increased bronchitic changes and accentuation of perihilar markings versus previous study question minimal pulmonary edema. Small right and tiny left pleural effusions. No pneumothorax or acute osseous findings.  IMPRESSION: No pneumothorax following pacemaker / AICD revision. New small right/tiny left pleural effusions and question minimal perihilar edema.   Original Report Authenticated By: Ulyses Southward, M.D.   EKG: ventricular tachycardia, rate 100  A/P  1. VT The patient has slow VT in the office today.   He is therefore admitted for further management. He has been on amiodarone a long time.   His VT rate is 110 bpm and therefore is difficult to treat with device therapy. I think that ablation is the best option. Therapeutic strategies for ventricular tachycardia including medicine and ablation were discussed in detail with the patient today. Risk, benefits, and alternatives to EP study and radiofrequency ablation were also discussed in detail today. These risks include but are not limited to stroke, bleeding, vascular damage, tamponade, perforation, damage to the heart and other structures, AV block requiring pacemaker, worsening renal function, and death. The patient understands these risk and wishes to proceed.  We will therefore proceed with catheter ablation at the next available time.  He will likely remain in VT overnight.  As he has been very stable in VT, I would prefer to avoid aggressive medical control over the next 24 hours as I would like his VT to be inducible at time of his EP study.  No IV amiodarone!  2. Atrial lead dislodgement Once his VT ablation has been performed, we will address his dislodged lead.  I dont think that we should revise the system before his ablation as recurrent lead dislodgement could occur during ablation.  3. CAD Stable No change required today  4. HTN Stable No change required today

## 2013-06-04 NOTE — Progress Notes (Signed)
Echo Lab  2D Echocardiogram completed.  Tekelia Kareem L Mattea Seger, RDCS 06/04/2013 4:29 PM

## 2013-06-04 NOTE — Progress Notes (Signed)
Peripherally Inserted Central Catheter/Midline Placement  The IV Nurse has discussed with the patient and/or persons authorized to consent for the patient, the purpose of this procedure and the potential benefits and risks involved with this procedure.  The benefits include less needle sticks, lab draws from the catheter and patient may be discharged home with the catheter.  Risks include, but not limited to, infection, bleeding, blood clot (thrombus formation), and puncture of an artery; nerve damage and irregular heat beat.  Alternatives to this procedure were also discussed.  PICC/Midline Placement Documentation  PICC / Midline Double Lumen 06/04/13 PICC Right Basilic (Active)       [name] 06/04/2013, 8:51 PM

## 2013-06-04 NOTE — Progress Notes (Signed)
*  PRELIMINARY RESULTS* Echocardiogram 2D Echocardiogram has been performed.  [name] 06/04/2013, 4:28 PM

## 2013-06-05 ENCOUNTER — Encounter (HOSPITAL_COMMUNITY)
Admission: AD | Disposition: A | Discharge status: Home / Self Care | Payer: Self-pay | Source: Ambulatory Visit | Attending: Internal Medicine

## 2013-06-05 ENCOUNTER — Inpatient Hospital Stay (HOSPITAL_COMMUNITY): Payer: Medicare Other | Admitting: Anesthesiology

## 2013-06-05 ENCOUNTER — Encounter (HOSPITAL_COMMUNITY): Payer: Self-pay | Admitting: Anesthesiology

## 2013-06-05 DIAGNOSIS — T82198A Other mechanical complication of other cardiac electronic device, initial encounter: Secondary | ICD-10-CM

## 2013-06-05 DIAGNOSIS — I319 Disease of pericardium, unspecified: Secondary | ICD-10-CM

## 2013-06-05 HISTORY — PX: LEAD REVISION: SHX5945

## 2013-06-05 LAB — BASIC METABOLIC PANEL
BUN: 23 mg/dL (ref 6–23)
CO2: 25 mEq/L (ref 19–32)
Chloride: 101 mEq/L (ref 96–112)
Creatinine, Ser: 1.85 mg/dL — ABNORMAL HIGH (ref 0.50–1.35)

## 2013-06-05 SURGERY — LEAD REVISION
Anesthesia: Monitor Anesthesia Care

## 2013-06-05 MED ORDER — SODIUM CHLORIDE 0.9 % IJ SOLN: 3.0000 mL | INTRAMUSCULAR | Status: DC | PRN

## 2013-06-05 MED ORDER — SODIUM CHLORIDE 0.9 % IJ SOLN
3.0000 mL | Freq: Two times a day (BID) | INTRAMUSCULAR | Status: DC
  Administered 2013-06-05 – 2013-06-07 (×6): 3 mL via INTRAVENOUS
  Administered 2013-06-08: 20 mL via INTRAVENOUS

## 2013-06-05 MED ORDER — SODIUM CHLORIDE 0.9 % IV SOLN
INTRAVENOUS | Status: DC | PRN
  Administered 2013-06-05: 08:00:00 via INTRAVENOUS

## 2013-06-05 MED ORDER — SODIUM CHLORIDE 0.9 % IV SOLN: 250.0000 mL | INTRAVENOUS | Status: DC | PRN

## 2013-06-05 MED ORDER — LIDOCAINE HCL (PF) 1 % IJ SOLN
INTRAMUSCULAR | Status: AC
  Filled 2013-06-05: qty 60

## 2013-06-05 MED ORDER — ONDANSETRON HCL 4 MG/2ML IJ SOLN: 4.0000 mg | Freq: Four times a day (QID) | INTRAMUSCULAR | Status: DC | PRN

## 2013-06-05 MED ORDER — MEXILETINE HCL 200 MG PO CAPS
200.0000 mg | ORAL_CAPSULE | Freq: Three times a day (TID) | ORAL | Status: DC
  Administered 2013-06-05 – 2013-06-08 (×9): 200 mg via ORAL
  Filled 2013-06-05 (×12): qty 1

## 2013-06-05 MED ORDER — CEFAZOLIN SODIUM 1-5 GM-% IV SOLN
1.0000 g | Freq: Four times a day (QID) | INTRAVENOUS | Status: AC
  Administered 2013-06-05 – 2013-06-06 (×3): 1 g via INTRAVENOUS
  Filled 2013-06-05 (×4): qty 50

## 2013-06-05 MED ORDER — ACETAMINOPHEN 325 MG PO TABS: 325.0000 mg | ORAL_TABLET | ORAL | Status: DC | PRN

## 2013-06-05 NOTE — Transfer of Care (Signed)
Immediate Anesthesia Transfer of Care Note  Patient: [name]  Procedure(s) Performed: Procedure(s): LEAD REVISION (N/A)  Patient Location: PACU  Anesthesia Type:MAC  Level of Consciousness: awake, alert , oriented and patient cooperative  Airway & Oxygen Therapy: Patient Spontanous Breathing  Post-op Assessment: Report given to PACU RN, Post -op Vital signs reviewed and stable and Patient moving all extremities X 4  Post vital signs: Reviewed and stable  Complications: No apparent anesthesia complications

## 2013-06-05 NOTE — Interval H&P Note (Signed)
History and Physical Interval Note:  06/05/2013 7:36 AM  [name]  has presented today for surgery, with the diagnosis of vt  The various methods of treatment have been discussed with the patient and family. After consideration of risks, benefits and other options for treatment, the patient has consented to  Procedure(s): LEAD REVISION (N/A) as a surgical intervention .  The patient's history has been reviewed, patient examined, no change in status, stable for surgery.  I have reviewed the patient's chart and labs.  Questions were answered to the patient's satisfaction.     Hillis Range

## 2013-06-05 NOTE — Progress Notes (Signed)
Utilization review completed. Chrissa Meetze, RN, BSN. 

## 2013-06-05 NOTE — Progress Notes (Signed)
Echocardiogram 2D Echocardiogram limited has been performed.  [name] 06/05/2013, 2:33 PM

## 2013-06-05 NOTE — Progress Notes (Signed)
SUBJECTIVE: The patient is doing well today.  At this time, he denies chest pain, shortness of breath, or any new concerns.  Echo yesterday demonstrated EF 20-25% with diffuse hypokinesis, no pericardial effusion  For right atrial lead revision today.  Patient with more VT last night, now in SR.   [name] aspirin  81 mg Oral Daily  . atorvastatin  80 mg Oral Daily  .  ceFAZolin (ANCEF) IV  2 g Intravenous On Call  . clopidogrel  75 mg Oral Daily  . furosemide  20 mg Oral Daily  . gentamicin irrigation  80 mg Irrigation On Call  . levothyroxine  112 mcg Oral QAC breakfast  . loratadine  10 mg Oral Daily  . multivitamin with minerals  1 tablet Oral Daily  . niacin  1,500 mg Oral Daily  . sodium chloride  10-40 mL Intracatheter Q12H  . sodium chloride  3 mL Intravenous Q12H   . sodium chloride    . amiodarone (NEXTERONE PREMIX) 360 mg/200 mL dextrose 30 mg/hr (06/05/13 0030)    OBJECTIVE: Physical Exam: Filed Vitals:   06/05/13 0200 06/05/13 0300 06/05/13 0400 06/05/13 0500  BP: 106/50 117/64 93/37 125/58  Pulse: 46 51 44 44  Temp:   98 F (36.7 C)   TempSrc:   Oral   Resp: 10 11 11 11   Height:      Weight:      SpO2: 99% 100% 97% 100%    Intake/Output Summary (Last 24 hours) at 06/05/13 0559 Last data filed at 06/05/13 0500  Gross per 24 hour  Intake 1096.35 ml  Output    800 ml  Net 296.35 ml    Telemetry reveals sinus rhythm currently, VT most of the night  GEN- The patient is well appearing, alert and oriented x 3 today.   Head- normocephalic, atraumatic Eyes-  Sclera clear, conjunctiva pink Ears- hearing intact Oropharynx- clear Neck- supple, no JVP Lymph- no cervical lymphadenopathy Lungs- Clear to ausculation bilaterally, normal work of breathing Heart- Regular rate and rhythm, no murmurs, rubs or gallops, PMI not laterally displaced GI- soft, NT, ND, + BS Extremities- no clubbing, cyanosis, or edema Skin- ICD pocket is without a hematoma Psych- euthymic  mood, full affect Neuro- strength and sensation are intact  LABS: Basic Metabolic Panel:  Recent Labs  40/98/11 1447 06/05/13 0130  NA 135 135  K 4.3 4.1  CL 102 101  CO2 23 25  GLUCOSE 102* 109*  BUN 23 23  CREATININE 1.66* 1.85*  CALCIUM 8.9 8.6  MG 2.2  --    Liver Function Tests:  Recent Labs  06/04/13 1447  AST 42*  ALT 35  ALKPHOS 130*  BILITOT 0.5  PROT 6.4  ALBUMIN 3.5   CBC:  Recent Labs  06/04/13 1447  WBC 4.9  HGB 12.3*  HCT 35.9*  MCV 100.0  PLT 118*   Cardiac Enzymes:  Recent Labs  06/04/13 1447 06/04/13 1957 06/05/13 0130  TROPONINI <0.30 <0.30 <0.30   Thyroid Function Tests:  Recent Labs  06/04/13 1447  TSH 2.961    RADIOLOGY: Dg Chest 2 View 05/26/2013   *RADIOLOGY REPORT*  Clinical Data:  Post pacemaker placement  CHEST - 2 VIEW  Comparison: 11/23/2012  Findings: Left subclavian AICD leads project at right atrium and right ventricle. Mild enlargement of cardiac silhouette. Atherosclerotic calcification aorta. Increased bronchitic changes and accentuation of perihilar markings versus previous study question minimal pulmonary edema. Small right and tiny left pleural effusions. No pneumothorax  or acute osseous findings.  IMPRESSION: No pneumothorax following pacemaker / AICD revision. New small right/tiny left pleural effusions and question minimal perihilar edema.   Original Report Authenticated By: Ulyses Southward, M.D.   Dg Chest Port 1 View 06/04/2013   *RADIOLOGY REPORT*  Clinical Data: PICC line placement.  PORTABLE CHEST - 1 VIEW  Comparison: Film earlier today.  Findings: Right upper extremity PICC line is in place.  The tip is at the level of the mid SVC.  The lungs are clear.  Stable cardiomegaly and appearance of a pacing / defibrillating device.  IMPRESSION: PICC line tip is at the level of the mid SVC.   Original Report Authenticated By: Irish Lack, M.D.   Dg Chest Port 1 View 06/04/2013   *RADIOLOGY REPORT*  Clinical Data: Seen  ventricular tachycardia ablation.  Evaluate cardiac leads.  No current chest complaints.  PORTABLE CHEST - 1 VIEW  Comparison: 05/26/2013  Findings: A dual lead pacer is in place with the ventricular lead tip stable in the region of the right ventricle. The atrial lead tip projects outside of the cardiac silhouette, worrisome for extracardiac positioning and interim perforation through the atrial wall. No pleural fluid is seen to suggest active leakage.  Heart and mediastinal contours remain within normal limits.  The lung fields demonstrate interval clearing of the interstitial prominence on the prior exam and resolved small right pleural effusion compatible with resolved interstitial edema.  The left cardiophrenic angle is excluded from the film for assessment of residual left pleural effusion.  No pneumothorax is seen.  No focal infiltrates or overt congestive failure is noted.  Bony structures appear intact.  Calcification in the aortic arch is stable.  IMPRESSION: Positioning of the atrial lead appears positioned outside the cardiac silhouette worrisome for extracardiac positioning and interim atrial perforation. These results were discussed in person with Dr. Ward Givens on 06/04/2013 at 3 pm. TThe  Cleared interstitial edema with no worrisome pulmonary findings identified.   Original Report Authenticated By: Rhodia Albright, M.D.    ASSESSMENT AND PLAN:   1. Atrial lead perforation The patient will require lead revision.  Risks, benefits, alternatives to ICD system revision were discussed in detail with the patient today. The patient understands that the risks include but are not limited to bleeding, infection, pneumothorax, perforation, tamponade, vascular damage, renal failure, MI, stroke, death,  and lead dislodgement and wishes to proceed. We will therefore proceed at this time.  2. VT Improved with amiodarone Continue IV amiodarone for now  3. CAD Stable No change required today

## 2013-06-05 NOTE — Preoperative (Signed)
Beta Blockers   Reason not to administer Beta Blockers:Not Applicable 

## 2013-06-05 NOTE — Op Note (Signed)
NAMETEREK, BEE NO.:  [id number]  MEDICAL RECORD NO.:  [id number]  LOCATION:  2922                         FACILITY:  [facility]  PHYSICIAN:  Hillis Range, MD       DATE OF BIRTH:  03-29-1941  DATE OF PROCEDURE: DATE OF DISCHARGE:                              OPERATIVE REPORT   PREPROCEDURE DIAGNOSES: 1. Defibrillator lead dislodgement. 2. Sustained ventricular tachycardia. 3. Ischemic cardiomyopathy.  POSTPROCEDURE DIAGNOSES: 1. Defibrillator lead dislodgement. 2. Sustained ventricular tachycardia. 3. Ischemic cardiomyopathy.  PROCEDURES: 1. Defibrillator lead evaluation with fluoroscopy. 2. Implantable cardioverter-defibrillator lead removal. 3. Implantable cardioverter-defibrillator lead placement. 4. Limited echocardiography.  INTRODUCTION:  [name] is a very pleasant 72 year old gentleman with a known history of ventricular tachycardia status post prior VT ablation in 2012.  He has an ischemic cardiomyopathy with an ejection fraction of 20%.  He recently presented to [facility] for elective ICD pulse generator replacement.  Due to chronic and symptomatic bradycardia, he underwent atrial lead placement at the same time.  This occurred on May 25, 2013.  The procedure was uneventful and the patient was discharged. Upon followup in the Device Clinic on June 04, 2013, the patient was noted to have dislodged right atrial lead.  He therefore presents today for system revision.  DESCRIPTION OF PROCEDURE:  Informed written consent was obtained and the patient was brought to the Electrophysiology Lab in the fasting state. He required no sedation for the procedure today.  However, anesthesia was present in the room on hand for assistance.  His defibrillator was interrogated and atrial lead values were consistent with dislodgement. A limited echocardiogram was performed, which revealed no pericardial effusion.  The lead was evaluated fluoroscopically  and this revealed that the right atrial lead had perforated the right atrium and appeared to be in the pericardial space.  The patient was, however, noted to be hemodynamically stable.  He did present to the Electrophysiology Lab and sustained right bundle-branch ventricular tachycardia at 105 beats per minute.  VT was successfully terminated with rapid ventricular pacing. He remained in sinus rhythm thereafter.  The patient was prepped and draped in the usual sterile fashion by the EP Lab staff.  The skin overlying the existing defibrillator was infiltrated with lidocaine for local analgesia.  A 4-cm incision was made over the device.  A moderate amount of heme was noted within the pocket.  There was no foreign matter or debris observed.  The device was removed from the pocket and disconnected from the atrial lead.  The left axillary vein was cannulated with fluoroscopic visualization.  No contrast was required for this endeavor.  Through the left axillary vein, a Medtronic model [id number] (serial [id number]).  Right atrial lead was advanced with fluoroscopic visualization into the right atrial appendage.  In this location, P-waves measured 3.8 mV with impedance of 505 ohms and a threshold of 0.4 V at 0.5 milliseconds.  The lead was secured to the pectoralis fascia using #2 silk over the suture sleeve.  The previously placed Medtronic atrial lead was a Medtronic, model [id number] lead, implanted on May 25, 2013.  My attention was then turned to this lead. Two separate silk sutures were  identified and removed over the suture sleeve.  A stylet was placed in the lead and the active helix was withdrawn in a standard fashion.  The lead was initially pulled back into the right atrium and then easily removed from the body with gentle manual traction.  The patient remained hemodynamically stable throughout.  A single hemostatic stitch was placed over the site with the lead was previously placed.   Pocket was then irrigated with copious gentamicin solution.  The newly placed Medtronic model 5592 lead was connected to the previously implanted Medtronic Evera  XT DR, serial #[id number] device.  The previously implanted right ventricular lead was confirmed to be a Medtronic, model [id number] lead (serial [id number]).  This lead was examined thoroughly and its integrity remained intact.  This lead was not disconnected from the can today.  The pocket was irrigated with copious gentamicin solution.  The device was then placed back in the pocket.  The pocket was then closed in two layers with 2-0 Vicryl suture for the subcutaneous and subcuticular layers.  Steri-Strips and a sterile dressing were then applied.  I did not perform DFT testing today.  A limited bedside transthoracic echocardiogram was performed by me which revealed.  No pericardial effusion.  The patient remained hemodynamically stable at the time of transfer back to the recovery area.  The procedure was therefore considered completed.  CONCLUSIONS: 1. Right atrial lead perforation successfully removed. 2. A new Medtronic passive right atrial lead was placed. 3. No early apparent complications.     Hillis Range, MD     JA/MEDQ  D:  06/05/2013  T:  06/05/2013  Job:  [id number]  cc:   Duke Salvia, MD, [facility] Peter M. [place], M.D.

## 2013-06-05 NOTE — H&P (View-Only) (Signed)
 SUBJECTIVE: The patient is doing well today.  At this time, he denies chest pain, shortness of breath, or any new concerns.  Echo yesterday demonstrated EF 20-25% with diffuse hypokinesis, no pericardial effusion  For right atrial lead revision today.  Patient with more VT last night, now in SR.   . aspirin  81 mg Oral Daily  . atorvastatin  80 mg Oral Daily  .  ceFAZolin (ANCEF) IV  2 g Intravenous On Call  . clopidogrel  75 mg Oral Daily  . furosemide  20 mg Oral Daily  . gentamicin irrigation  80 mg Irrigation On Call  . levothyroxine  112 mcg Oral QAC breakfast  . loratadine  10 mg Oral Daily  . multivitamin with minerals  1 tablet Oral Daily  . niacin  1,500 mg Oral Daily  . sodium chloride  10-40 mL Intracatheter Q12H  . sodium chloride  3 mL Intravenous Q12H   . sodium chloride    . amiodarone (NEXTERONE PREMIX) 360 mg/200 mL dextrose 30 mg/hr (06/05/13 0030)    OBJECTIVE: Physical Exam: Filed Vitals:   06/05/13 0200 06/05/13 0300 06/05/13 0400 06/05/13 0500  BP: 106/50 117/64 93/37 125/58  Pulse: 46 51 44 44  Temp:   98 F (36.7 C)   TempSrc:   Oral   Resp: 10 11 11 11  Height:      Weight:      SpO2: 99% 100% 97% 100%    Intake/Output Summary (Last 24 hours) at 06/05/13 0559 Last data filed at 06/05/13 0500  Gross per 24 hour  Intake 1096.35 ml  Output    800 ml  Net 296.35 ml    Telemetry reveals sinus rhythm currently, VT most of the night  GEN- The patient is well appearing, alert and oriented x 3 today.   Head- normocephalic, atraumatic Eyes-  Sclera clear, conjunctiva pink Ears- hearing intact Oropharynx- clear Neck- supple, no JVP Lymph- no cervical lymphadenopathy Lungs- Clear to ausculation bilaterally, normal work of breathing Heart- Regular rate and rhythm, no murmurs, rubs or gallops, PMI not laterally displaced GI- soft, NT, ND, + BS Extremities- no clubbing, cyanosis, or edema Skin- ICD pocket is without a hematoma Psych- euthymic  mood, full affect Neuro- strength and sensation are intact  LABS: Basic Metabolic Panel:  Recent Labs  06/04/13 1447 06/05/13 0130  NA 135 135  K 4.3 4.1  CL 102 101  CO2 23 25  GLUCOSE 102* 109*  BUN 23 23  CREATININE 1.66* 1.85*  CALCIUM 8.9 8.6  MG 2.2  --    Liver Function Tests:  Recent Labs  06/04/13 1447  AST 42*  ALT 35  ALKPHOS 130*  BILITOT 0.5  PROT 6.4  ALBUMIN 3.5   CBC:  Recent Labs  06/04/13 1447  WBC 4.9  HGB 12.3*  HCT 35.9*  MCV 100.0  PLT 118*   Cardiac Enzymes:  Recent Labs  06/04/13 1447 06/04/13 1957 06/05/13 0130  TROPONINI <0.30 <0.30 <0.30   Thyroid Function Tests:  Recent Labs  06/04/13 1447  TSH 2.961    RADIOLOGY: Dg Chest 2 View 05/26/2013   *RADIOLOGY REPORT*  Clinical Data:  Post pacemaker placement  CHEST - 2 VIEW  Comparison: 11/23/2012  Findings: Left subclavian AICD leads project at right atrium and right ventricle. Mild enlargement of cardiac silhouette. Atherosclerotic calcification aorta. Increased bronchitic changes and accentuation of perihilar markings versus previous study question minimal pulmonary edema. Small right and tiny left pleural effusions. No pneumothorax   or acute osseous findings.  IMPRESSION: No pneumothorax following pacemaker / AICD revision. New small right/tiny left pleural effusions and question minimal perihilar edema.   Original Report Authenticated By: Mark Boles, M.D.   Dg Chest Port 1 View 06/04/2013   *RADIOLOGY REPORT*  Clinical Data: PICC line placement.  PORTABLE CHEST - 1 VIEW  Comparison: Film earlier today.  Findings: Right upper extremity PICC line is in place.  The tip is at the level of the mid SVC.  The lungs are clear.  Stable cardiomegaly and appearance of a pacing / defibrillating device.  IMPRESSION: PICC line tip is at the level of the mid SVC.   Original Report Authenticated By: Glenn Yamagata, M.D.   Dg Chest Port 1 View 06/04/2013   *RADIOLOGY REPORT*  Clinical Data: Seen  ventricular tachycardia ablation.  Evaluate cardiac leads.  No current chest complaints.  PORTABLE CHEST - 1 VIEW  Comparison: 05/26/2013  Findings: A dual lead pacer is in place with the ventricular lead tip stable in the region of the right ventricle. The atrial lead tip projects outside of the cardiac silhouette, worrisome for extracardiac positioning and interim perforation through the atrial wall. No pleural fluid is seen to suggest active leakage.  Heart and mediastinal contours remain within normal limits.  The lung fields demonstrate interval clearing of the interstitial prominence on the prior exam and resolved small right pleural effusion compatible with resolved interstitial edema.  The left cardiophrenic angle is excluded from the film for assessment of residual left pleural effusion.  No pneumothorax is seen.  No focal infiltrates or overt congestive failure is noted.  Bony structures appear intact.  Calcification in the aortic arch is stable.  IMPRESSION: Positioning of the atrial lead appears positioned outside the cardiac silhouette worrisome for extracardiac positioning and interim atrial perforation. These results were discussed in person with Dr. Chris Berge on 06/04/2013 at 3 pm. TThe  Cleared interstitial edema with no worrisome pulmonary findings identified.   Original Report Authenticated By: Rebecca Kennedy, M.D.    ASSESSMENT AND PLAN:   1. Atrial lead perforation The patient will require lead revision.  Risks, benefits, alternatives to ICD system revision were discussed in detail with the patient today. The patient understands that the risks include but are not limited to bleeding, infection, pneumothorax, perforation, tamponade, vascular damage, renal failure, MI, stroke, death,  and lead dislodgement and wishes to proceed. We will therefore proceed at this time.  2. VT Improved with amiodarone Continue IV amiodarone for now  3. CAD Stable No change required today   

## 2013-06-05 NOTE — Anesthesia Preprocedure Evaluation (Addendum)
Anesthesia Evaluation  Patient identified by MRN, date of birth, ID band Patient awake    Reviewed: Allergy & Precautions, H&P , NPO status , Patient's Chart, lab work & pertinent test results, reviewed documented beta blocker date and time   Airway Mallampati: II TM Distance: >3 FB Neck ROM: full    Dental  (+) Edentulous Upper and Edentulous Lower   Pulmonary sleep apnea ,  breath sounds clear to auscultation        Cardiovascular hypertension, On Medications, On Home Beta Blockers and Pt. on home beta blockers + angina + CAD, + Past MI, + Cardiac Stents and + Peripheral Vascular Disease + dysrhythmias Ventricular Tachycardia + Cardiac Defibrillator Rhythm:regular     Neuro/Psych TIACVA negative psych ROS   GI/Hepatic negative GI ROS, Neg liver ROS,   Endo/Other  Hypothyroidism   Renal/GU Renal InsufficiencyRenal disease  negative genitourinary   Musculoskeletal   Abdominal   Peds  Hematology negative hematology ROS (+) anemia ,   Anesthesia Other Findings See surgeon's H&P   Reproductive/Obstetrics negative OB ROS                          Anesthesia Physical Anesthesia Plan  ASA: IV  Anesthesia Plan: MAC   Post-op Pain Management:    Induction: Intravenous  Airway Management Planned: Simple Face Mask  Additional Equipment:   Intra-op Plan:   Post-operative Plan:   Informed Consent: I have reviewed the patients History and Physical, chart, labs and discussed the procedure including the risks, benefits and alternatives for the proposed anesthesia with the patient or authorized representative who has indicated his/her understanding and acceptance.   Dental Advisory Given  Plan Discussed with: CRNA, Surgeon and Anesthesiologist  Anesthesia Plan Comments:       Anesthesia Quick Evaluation

## 2013-06-05 NOTE — Brief Op Note (Signed)
Right atrial lead dislodgment with perforation identified and the lead was removed A new right atrial lead (MDT 5592) was placed.  The patient remained stable throuhgout with no effusion evident and the conclusion of the procedure.  See dictation # [id number] for full report.

## 2013-06-05 NOTE — Anesthesia Postprocedure Evaluation (Signed)
  Anesthesia Post-op Note  Patient: [name]  Procedure(s) Performed: Procedure(s): LEAD REVISION (N/A)  Patient Location: PACU  Anesthesia Type:MAC  Level of Consciousness: awake, alert , oriented and patient cooperative  Airway and Oxygen Therapy: Patient Spontanous Breathing  Post-op Pain: mild  Post-op Assessment: Post-op Vital signs reviewed, Patient's Cardiovascular Status Stable, Respiratory Function Stable, Patent Airway, No signs of Nausea or vomiting and Pain level controlled  Post-op Vital Signs: Reviewed and stable  Complications: No apparent anesthesia complications

## 2013-06-06 ENCOUNTER — Inpatient Hospital Stay (HOSPITAL_COMMUNITY): Payer: Medicare Other

## 2013-06-06 LAB — BASIC METABOLIC PANEL
Calcium: 8.7 mg/dL (ref 8.4–10.5)
Creatinine, Ser: 1.58 mg/dL — ABNORMAL HIGH (ref 0.50–1.35)
GFR calc non Af Amer: 42 mL/min — ABNORMAL LOW (ref >90)
Glucose, Bld: 104 mg/dL — ABNORMAL HIGH (ref 70–99)
Sodium: 134 mEq/L — ABNORMAL LOW (ref 135–145)

## 2013-06-06 MED ORDER — SODIUM CHLORIDE 0.9 % IV SOLN
INTRAVENOUS | Status: DC
  Administered 2013-06-06: 15 mL via INTRAVENOUS

## 2013-06-06 NOTE — Progress Notes (Signed)
Patient ID: [name], male   DOB: 06-26-1941, 72 y.o.   MRN: [id number] Subjective:  No chest pain or sob  Objective:  Vital Signs in the last 24 hours: Temp:  [98 F (36.7 C)-99 F (37.2 C)] 98 F (36.7 C) (08/02 0846) Pulse Rate:  [60-98] 98 (08/02 0846) Resp:  [0-18] 18 (08/02 0846) BP: (98-120)/(32-66) 115/59 mmHg (08/02 0846) SpO2:  [92 %-98 %] 96 % (08/02 0846) Weight:  [202 lb (91.627 kg)] 202 lb (91.627 kg) (08/02 0500)  Intake/Output from previous day: 08/01 0701 - 08/02 0700 In: 827.1 [P.O.:240; I.V.:487.1; IV Piggyback:100] Out: 2000 [Urine:2000] Intake/Output from this shift: Total I/O In: 120 [P.O.:120] Out: -   Physical Exam: Well appearing NAD HEENT: Unremarkable Neck:  No JVD, no thyromegally Lungs:  Clear with no wheezes, pm pocket without hematoma. HEART:  Regular rate rhythm, no murmurs, no rubs, no clicks Abd:  soft, positive bowel sounds, no organomegally, no rebound, no guarding Ext:  2 plus pulses, no edema, no cyanosis, no clubbing Skin:  No rashes no nodules Neuro:  CN II through XII intact, motor grossly intact  Lab Results:  Recent Labs  06/04/13 1447  WBC 4.9  HGB 12.3*  PLT 118*    Recent Labs  06/05/13 0130 06/06/13 0400  NA 135 134*  K 4.1 4.6  CL 101 102  CO2 25 26  GLUCOSE 109* 104*  BUN 23 19  CREATININE 1.85* 1.58*    Recent Labs  06/04/13 1957 06/05/13 0130  TROPONINI <0.30 <0.30   Hepatic Function Panel  Recent Labs  06/04/13 1447  PROT 6.4  ALBUMIN 3.5  AST 42*  ALT 35  ALKPHOS 130*  BILITOT 0.5   No results found for this basename: CHOL,  in the last 72 hours No results found for this basename: PROTIME,  in the last 72 hours  Imaging: Dg Chest Port 1 View  06/06/2013   *RADIOLOGY REPORT*  Clinical Data: ICD revision  PORTABLE CHEST - 1 VIEW  Comparison: Prior chest x-ray 06/04/2013  Findings: Left subclavian approach cardiac rhythm maintenance device with leads projecting over the right  atrium and right ventricle.  The configuration of the right atrial lead has changed compared to the prior chest x-ray and is now redirected cephalad and projects within the silhouette of the heart.  The right upper extremity PICC remains in unchanged position with the tip in the mid SVC.  Cardiac and mediastinal contours are unchanged.  No enlargement of the cardiopericardial silhouette.  Atherosclerotic calcifications in the transverse aorta.  Trace bibasilar atelectasis.  The lungs are otherwise clear.  No edema or pneumothorax.  IMPRESSION:  Interval revision of the right atrial lead.  The lead now curves cephalad and the tip projects within the cardiopericardial silhouette.  Trace right basilar atelectasis.  No significant enlargement the cardiopericardial silhouette.   Original Report Authenticated By: Malachy Moan, M.D.   Dg Chest Port 1 View  06/04/2013   *RADIOLOGY REPORT*  Clinical Data: PICC line placement.  PORTABLE CHEST - 1 VIEW  Comparison: Film earlier today.  Findings: Right upper extremity PICC line is in place.  The tip is at the level of the mid SVC.  The lungs are clear.  Stable cardiomegaly and appearance of a pacing / defibrillating device.  IMPRESSION: PICC line tip is at the level of the mid SVC.   Original Report Authenticated By: Irish Lack, M.D.   Dg Chest Port 1 View  06/04/2013   *RADIOLOGY REPORT*  Clinical Data: Seen ventricular tachycardia ablation.  Evaluate cardiac leads.  No current chest complaints.  PORTABLE CHEST - 1 VIEW  Comparison: 05/26/2013  Findings: A dual lead pacer is in place with the ventricular lead tip stable in the region of the right ventricle. The atrial lead tip projects outside of the cardiac silhouette, worrisome for extracardiac positioning and interim perforation through the atrial wall. No pleural fluid is seen to suggest active leakage.  Heart and mediastinal contours remain within normal limits.  The lung fields demonstrate interval clearing  of the interstitial prominence on the prior exam and resolved small right pleural effusion compatible with resolved interstitial edema.  The left cardiophrenic angle is excluded from the film for assessment of residual left pleural effusion.  No pneumothorax is seen.  No focal infiltrates or overt congestive failure is noted.  Bony structures appear intact.  Calcification in the aortic arch is stable.  IMPRESSION: Positioning of the atrial lead appears positioned outside the cardiac silhouette worrisome for extracardiac positioning and interim atrial perforation. These results were discussed in person with Dr. Ward Givens on 06/04/2013 at 3 pm. TThe  Cleared interstitial edema with no worrisome pulmonary findings identified.   Original Report Authenticated By: Rhodia Albright, M.D.    Cardiac Studies: Tele - nsr and slow VT, alternating Assessment/Plan:  1. Atrial lead perforation 2. S/p revision 3. Persistent slow VT at 95-100/min. Rec: Continue IV amio via the picc line. New atrial lead is working normally. Will observe on tele. If VT settles down, will discharge on Monday. If not, will consider VT ablation on Monday p.m.  LOS: 2 days    Lewayne Bunting 06/06/2013, 10:45 AM

## 2013-06-07 LAB — BASIC METABOLIC PANEL
BUN: 19 mg/dL (ref 6–23)
Calcium: 8.6 mg/dL (ref 8.4–10.5)
GFR calc Af Amer: 48 mL/min — ABNORMAL LOW (ref >90)
GFR calc non Af Amer: 42 mL/min — ABNORMAL LOW (ref >90)
Glucose, Bld: 105 mg/dL — ABNORMAL HIGH (ref 70–99)
Potassium: 4.2 mEq/L (ref 3.5–5.1)
Sodium: 136 mEq/L (ref 135–145)

## 2013-06-07 NOTE — Progress Notes (Signed)
Patient ID: [name], male   DOB: 01/28/1941, 71 y.o.   MRN: [id number] Subjective:  No chest pain or sob, feels minimal palpitations.  Objective:  Vital Signs in the last 24 hours: Temp:  [98 F (36.7 C)-99.2 F (37.3 C)] 98.6 F (37 C) (08/03 0331) Pulse Rate:  [60-98] 94 (08/03 0420) Resp:  [0-21] 12 (08/03 0420) BP: (92-135)/(47-79) 92/56 mmHg (08/03 0420) SpO2:  [88 %-99 %] 97 % (08/03 0420) Weight:  [201 lb 11.5 oz (91.5 kg)] 201 lb 11.5 oz (91.5 kg) (08/03 0500)  Intake/Output from previous day: 08/02 0701 - 08/03 0700 In: 1653.5 [P.O.:960; I.V.:693.5] Out: 1451 [Urine:1450; Stool:1] Intake/Output from this shift:    Physical Exam: Well appearing NAD HEENT: Unremarkable Neck:  No JVD, no thyromegally Lungs:  Clear with no wheezes, pm pocket without hematoma. HEART:  Regular rate rhythm, no murmurs, no rubs, no clicks Abd:  soft, positive bowel sounds, no organomegally, no rebound, no guarding Ext:  2 plus pulses, no edema, no cyanosis, no clubbing Skin:  No rashes no nodules Neuro:  CN II through XII intact, motor grossly intact  Lab Results:  Recent Labs  06/04/13 1447  WBC 4.9  HGB 12.3*  PLT 118*    Recent Labs  06/06/13 0400 06/07/13 0400  NA 134* 136  K 4.6 4.2  CL 102 102  CO2 26 27  GLUCOSE 104* 105*  BUN 19 19  CREATININE 1.58* 1.58*    Recent Labs  06/04/13 1957 06/05/13 0130  TROPONINI <0.30 <0.30   Hepatic Function Panel  Recent Labs  06/04/13 1447  PROT 6.4  ALBUMIN 3.5  AST 42*  ALT 35  ALKPHOS 130*  BILITOT 0.5   No results found for this basename: CHOL,  in the last 72 hours No results found for this basename: PROTIME,  in the last 72 hours  Imaging: Dg Chest Port 1 View  06/06/2013   *RADIOLOGY REPORT*  Clinical Data: ICD revision  PORTABLE CHEST - 1 VIEW  Comparison: Prior chest x-ray 06/04/2013  Findings: Left subclavian approach cardiac rhythm maintenance device with leads projecting over the right atrium  and right ventricle.  The configuration of the right atrial lead has changed compared to the prior chest x-ray and is now redirected cephalad and projects within the silhouette of the heart.  The right upper extremity PICC remains in unchanged position with the tip in the mid SVC.  Cardiac and mediastinal contours are unchanged.  No enlargement of the cardiopericardial silhouette.  Atherosclerotic calcifications in the transverse aorta.  Trace bibasilar atelectasis.  The lungs are otherwise clear.  No edema or pneumothorax.  IMPRESSION:  Interval revision of the right atrial lead.  The lead now curves cephalad and the tip projects within the cardiopericardial silhouette.  Trace right basilar atelectasis.  No significant enlargement the cardiopericardial silhouette.   Original Report Authenticated By: Heath McCullough, M.D.    Cardiac Studies: Tele - nsr and slow VT, alternating, with approximately 80% VT at 95-100/min. Assessment/Plan:  1. Atrial lead perforation 2. S/p revision 3. Persistent slow VT at 95-100/min. Rec: Continue IV amio via the picc line. New atrial lead is working normally. Will plan VT ablation tomorrow, pending CARTO availability.  LOS: 3 days    Durrel Mcnee,M.D. 06/07/2013, 7:45 AM    

## 2013-06-07 NOTE — Progress Notes (Signed)
Patient had a 43beat run of nonsustained vtach on telemetry monitor while in awake in bed. Ppatient denied any complaints of chest pain/discomfort or dyspnea. BP 13560   SaO2 98% on 2L/[place]. No obvious signs of acute distress noted. Strips posted on chart for further MD review. Will continue to monitor and advise attending as needed.

## 2013-06-07 NOTE — Progress Notes (Signed)
Report from Night RN. Chart reviewed together. Handoff complete.Introductions complete. Will continue to monitor and advise attending as needed.   

## 2013-06-07 NOTE — Progress Notes (Addendum)
Patient had a 51 beat run of nonsustained vtach on telemetry monitor while asleep. Upon awakening, patient denied any complaints of chest pain/discomfort or dyspnea. BP 92/54 P 94 SaO2 98% on 2L/[place]. No obvious signs of acute distress noted. Strips posted on chart for further MD review. Dr Shirlee Latch notified, new orders obtained.

## 2013-06-08 ENCOUNTER — Encounter (HOSPITAL_COMMUNITY)
Admission: AD | Disposition: A | Discharge status: Home / Self Care | Payer: Self-pay | Source: Ambulatory Visit | Attending: Internal Medicine

## 2013-06-08 ENCOUNTER — Inpatient Hospital Stay (HOSPITAL_COMMUNITY): Payer: Medicare Other

## 2013-06-08 DIAGNOSIS — I472 Ventricular tachycardia, unspecified: Secondary | ICD-10-CM

## 2013-06-08 HISTORY — PX: V-TACH ABLATION: SHX5498

## 2013-06-08 LAB — CBC
MCH: 34.4 pg — ABNORMAL HIGH (ref 26.0–34.0)
MCV: 101.5 fL — ABNORMAL HIGH (ref 78.0–100.0)
Platelets: 113 10*3/uL — ABNORMAL LOW (ref 150–400)
RDW: 15.3 % (ref 11.5–15.5)
WBC: 8.4 10*3/uL (ref 4.0–10.5)

## 2013-06-08 LAB — POCT ACTIVATED CLOTTING TIME: Activated Clotting Time: 155 seconds

## 2013-06-08 SURGERY — V-TACH ABLATION
Anesthesia: LOCAL

## 2013-06-08 MED ORDER — FENTANYL CITRATE 0.05 MG/ML IJ SOLN
INTRAMUSCULAR | Status: AC
  Administered 2013-06-08: 25 ug via INTRAVENOUS
  Filled 2013-06-08: qty 2

## 2013-06-08 MED ORDER — SODIUM CHLORIDE 0.9 % IJ SOLN: 3.0000 mL | Freq: Two times a day (BID) | INTRAMUSCULAR | Status: DC

## 2013-06-08 MED ORDER — HYDROCODONE-ACETAMINOPHEN 5-325 MG PO TABS
1.0000 | ORAL_TABLET | Freq: Four times a day (QID) | ORAL | Status: DC | PRN
  Administered 2013-06-08: 19:00:00 2 via ORAL
  Filled 2013-06-08: qty 2

## 2013-06-08 MED ORDER — HEPARIN (PORCINE) IN NACL 2-0.9 UNIT/ML-% IJ SOLN
INTRAMUSCULAR | Status: AC
  Filled 2013-06-08: qty 500

## 2013-06-08 MED ORDER — ONDANSETRON HCL 4 MG/2ML IJ SOLN: 4.0000 mg | Freq: Four times a day (QID) | INTRAMUSCULAR | Status: DC | PRN

## 2013-06-08 MED ORDER — MIDAZOLAM HCL 5 MG/5ML IJ SOLN
INTRAMUSCULAR | Status: AC
  Filled 2013-06-08: qty 5

## 2013-06-08 MED ORDER — SODIUM CHLORIDE 0.9 % IJ SOLN: 3.0000 mL | INTRAMUSCULAR | Status: DC | PRN

## 2013-06-08 MED ORDER — FENTANYL CITRATE 0.05 MG/ML IJ SOLN: 25.0000 ug | Freq: Once | INTRAMUSCULAR | Status: AC

## 2013-06-08 MED ORDER — FENTANYL CITRATE 0.05 MG/ML IJ SOLN
INTRAMUSCULAR | Status: AC
  Filled 2013-06-08: qty 2

## 2013-06-08 MED ORDER — SODIUM CHLORIDE 0.9 % IV SOLN: 250.0000 mL | INTRAVENOUS | Status: DC | PRN

## 2013-06-08 MED ORDER — ACETAMINOPHEN 325 MG PO TABS: 325.0000 mg | ORAL_TABLET | ORAL | Status: DC | PRN

## 2013-06-08 MED ORDER — BUPIVACAINE HCL (PF) 0.25 % IJ SOLN
INTRAMUSCULAR | Status: AC
  Filled 2013-06-08: qty 60

## 2013-06-08 MED ORDER — FENTANYL CITRATE 0.05 MG/ML IJ SOLN: 25.0000 ug | INTRAMUSCULAR | Status: DC | PRN

## 2013-06-08 MED ORDER — YOU HAVE A PACEMAKER BOOK
Freq: Once | Status: AC
  Administered 2013-06-08: 23:00:00
  Filled 2013-06-08: qty 1

## 2013-06-08 MED ORDER — ACETAMINOPHEN 325 MG PO TABS: 650.0000 mg | ORAL_TABLET | ORAL | Status: DC | PRN

## 2013-06-08 NOTE — Progress Notes (Signed)
When pt ambulated to use restroom, found unpackaged white pill in bed under patient. Dose of 75mg  plavix found in bed, apparently pt dropped it yesterday with other medications. Made note in [facility] that pt did not receive dose of plavix on 8/3 as previously charted. Pt was witnessed taking all medications on 8/4. Pt aware of mistake, no questions at this time.    Delynn Flavin, RN

## 2013-06-08 NOTE — Progress Notes (Signed)
Site area: right groin  Site Prior to Removal:  Level 0  Pressure Applied For 20  MINUTES    Minutes Beginning at 20:25  Manual:   yes  Patient Status During Pull:  WNL  Post Pull Groin Site:  Level 0  Post Pull Instructions Given:  yes  Post Pull Pulses Present:  yes  Dressing Applied:  yes  Comments:  No hematoma; V/S stable; No bleeding

## 2013-06-08 NOTE — Progress Notes (Signed)
Orthopedic Tech Progress Note Patient Details:  [name] 04-02-1941 [id number]  Ortho Devices Type of Ortho Device: Arm sling Ortho Device/Splint Location: LUE Ortho Device/Splint Interventions: Ordered;Application   Jennye Moccasin 06/08/2013, 7:13 PM

## 2013-06-08 NOTE — Interval H&P Note (Signed)
History and Physical Interval Note: VT persists despite IV amio and oral mexitil which is causing nausea. Will plan to proceed with EPS/RFA of VT later today.   06/08/2013 7:13 AM  [name]  has presented today for surgery, with the diagnosis of Vtach  The various methods of treatment have been discussed with the patient and family. After consideration of risks, benefits and other options for treatment, the patient has consented to  Procedure(s): V-TACH ABLATION (N/A) as a surgical intervention .  The patient's history has been reviewed, patient examined, no change in status, stable for surgery.  I have reviewed the patient's chart and labs.  Questions were answered to the patient's satisfaction.     Leonia Reeves.D.

## 2013-06-08 NOTE — H&P (View-Only) (Signed)
Patient ID: [name], male   DOB: Oct 09, 1941, 72 y.o.   MRN: [id number] Subjective:  No chest pain or sob, feels minimal palpitations.  Objective:  Vital Signs in the last 24 hours: Temp:  [98 F (36.7 C)-99.2 F (37.3 C)] 98.6 F (37 C) (08/03 0331) Pulse Rate:  [60-98] 94 (08/03 0420) Resp:  [0-21] 12 (08/03 0420) BP: (92-135)/(47-79) 92/56 mmHg (08/03 0420) SpO2:  [88 %-99 %] 97 % (08/03 0420) Weight:  [201 lb 11.5 oz (91.5 kg)] 201 lb 11.5 oz (91.5 kg) (08/03 0500)  Intake/Output from previous day: 08/02 0701 - 08/03 0700 In: 1653.5 [P.O.:960; I.V.:693.5] Out: 1451 [Urine:1450; Stool:1] Intake/Output from this shift:    Physical Exam: Well appearing NAD HEENT: Unremarkable Neck:  No JVD, no thyromegally Lungs:  Clear with no wheezes, pm pocket without hematoma. HEART:  Regular rate rhythm, no murmurs, no rubs, no clicks Abd:  soft, positive bowel sounds, no organomegally, no rebound, no guarding Ext:  2 plus pulses, no edema, no cyanosis, no clubbing Skin:  No rashes no nodules Neuro:  CN II through XII intact, motor grossly intact  Lab Results:  Recent Labs  06/04/13 1447  WBC 4.9  HGB 12.3*  PLT 118*    Recent Labs  06/06/13 0400 06/07/13 0400  NA 134* 136  K 4.6 4.2  CL 102 102  CO2 26 27  GLUCOSE 104* 105*  BUN 19 19  CREATININE 1.58* 1.58*    Recent Labs  06/04/13 1957 06/05/13 0130  TROPONINI <0.30 <0.30   Hepatic Function Panel  Recent Labs  06/04/13 1447  PROT 6.4  ALBUMIN 3.5  AST 42*  ALT 35  ALKPHOS 130*  BILITOT 0.5   No results found for this basename: CHOL,  in the last 72 hours No results found for this basename: PROTIME,  in the last 72 hours  Imaging: Dg Chest Port 1 View  06/06/2013   *RADIOLOGY REPORT*  Clinical Data: ICD revision  PORTABLE CHEST - 1 VIEW  Comparison: Prior chest x-ray 06/04/2013  Findings: Left subclavian approach cardiac rhythm maintenance device with leads projecting over the right atrium  and right ventricle.  The configuration of the right atrial lead has changed compared to the prior chest x-ray and is now redirected cephalad and projects within the silhouette of the heart.  The right upper extremity PICC remains in unchanged position with the tip in the mid SVC.  Cardiac and mediastinal contours are unchanged.  No enlargement of the cardiopericardial silhouette.  Atherosclerotic calcifications in the transverse aorta.  Trace bibasilar atelectasis.  The lungs are otherwise clear.  No edema or pneumothorax.  IMPRESSION:  Interval revision of the right atrial lead.  The lead now curves cephalad and the tip projects within the cardiopericardial silhouette.  Trace right basilar atelectasis.  No significant enlargement the cardiopericardial silhouette.   Original Report Authenticated By: Malachy Moan, M.D.    Cardiac Studies: Tele - nsr and slow VT, alternating, with approximately 80% VT at 95-100/min. Assessment/Plan:  1. Atrial lead perforation 2. S/p revision 3. Persistent slow VT at 95-100/min. Rec: Continue IV amio via the picc line. New atrial lead is working normally. Will plan VT ablation tomorrow, pending CARTO availability.  LOS: 3 days    Gregg Taylor,M.D. 06/07/2013, 7:45 AM

## 2013-06-08 NOTE — CV Procedure (Signed)
EPS/RFA of VT originating from the postero-basilar-lateral LV with 10 RF's resulting in termination of VT and no inducibility. [id number].

## 2013-06-08 NOTE — Progress Notes (Signed)
Site area: left groin  Site Prior to Removal:  Level 0  Pressure Applied For 20  MINUTES    Minutes Beginning at 20:20  Manual:   yes  Patient Status During Pull:  WNL  Post Pull Groin Site:  Level 0  Post Pull Instructions Given:  yes  Post Pull Pulses Present:  yes  Dressing Applied:  yes  Comments:  No hematoma; V/S stable; No bleeding

## 2013-06-08 NOTE — Progress Notes (Signed)
Pt not returning to unit 2900. Report called to Tim, RN on unit 6500. All belongings packed away and labeled, delivered to pt while in cath lab holding prior to transfer to room 6507. Girlfriend Byrd Hesselbach called and notified of pt's new room and that belongings are with him in cath holding.   Delynn Flavin, RN

## 2013-06-09 ENCOUNTER — Encounter (HOSPITAL_COMMUNITY): Payer: Self-pay

## 2013-06-09 ENCOUNTER — Telehealth: Payer: Self-pay | Admitting: Internal Medicine

## 2013-06-09 MED ORDER — AMIODARONE HCL 200 MG PO TABS: 200.0000 mg | ORAL_TABLET | Freq: Every day | ORAL | Status: DC

## 2013-06-09 MED ORDER — RAMIPRIL 2.5 MG PO CAPS: 2.5000 mg | ORAL_CAPSULE | Freq: Every day | ORAL | Status: DC

## 2013-06-09 MED ORDER — CARVEDILOL 6.25 MG PO TABS: 3.1250 mg | ORAL_TABLET | Freq: Two times a day (BID) | ORAL | Status: DC

## 2013-06-09 NOTE — Telephone Encounter (Signed)
Patient discharged today after ablation of slow VT. Patient lying down tonight and got shocked by ICD. No associated symptoms. Feels ok now.  I spoke with Dr. Ladona Ridgel who asked him to f/u in clinic tomorrow for device interrogation. If he is feeling bad or ICD fires again he is to come to ER.   Truman Hayward 9:32 PM

## 2013-06-09 NOTE — Discharge Summary (Signed)
ELECTROPHYSIOLOGY DISCHARGE SUMMARY    Patient ID: [name],  MRN: [id number], DOB/AGE: 1941/10/07 72 y.o.  Admit date: 06/04/2013 Discharge date: 06/09/2013  Primary Care Physician:Wray, Eula Listen, MD Primary Cardiologist: [place]  Primary Discharge Diagnosis:  Ventricular Tachycardia  Secondary Discharge Diagnoses:  Atrial lead dislodgement  Procedures This Admission: V-TACH ABLATION V-TACH ABLATION   History and Hospital Course: the patient was admitted to the hospital with ventricular tachycardia and evidence of perforation of his atrial pacing lead. He underwent atrial lead revision. He had incessant, slow, ventricular tachycardia, at 100 beats per minute. Despite intravenous amiodarone and mexiletine, his ventricular tachycardia persisted. He underwent successful catheter ablation of a left ventricular tachycardia. Post ablation, he had no additional ventricular arrhythmias. He is discharged home. He is in stable condition.  Discharge Vitals: Blood pressure 143/57, pulse 60, temperature 97.7 F (36.5 C), temperature source Oral, resp. rate 18, height 6\' 1"  (1.854 m), weight 206 lb 5.6 oz (93.6 kg), SpO2 97.00%.   Labs: Lab Results  Component Value Date   WBC 8.4 06/08/2013   HGB 11.6* 06/08/2013   HCT 34.2* 06/08/2013   MCV 101.5* 06/08/2013   PLT 113* 06/08/2013    Recent Labs Lab 06/04/13 1447  06/07/13 0400  NA 135  < > 136  K 4.3  < > 4.2  CL 102  < > 102  CO2 23  < > 27  BUN 23  < > 19  CREATININE 1.66*  < > 1.58*  CALCIUM 8.9  < > 8.6  PROT 6.4  --   --   BILITOT 0.5  --   --   ALKPHOS 130*  --   --   ALT 35  --   --   AST 42*  --   --   GLUCOSE 102*  < > 105*  < > = values in this interval not displayed. Lab Results  Component Value Date   CKTOTAL 166 05/30/2011   CKMB 4.5* 05/30/2011   TROPONINI <0.30 06/05/2013    Lab Results  Component Value Date   CHOL 118 11/28/2012   CHOL 118 05/29/2011   Lab Results  Component Value Date   HDL 50.80  11/28/2012   HDL 55 05/29/2011   Lab Results  Component Value Date   LDLCALC 58 11/28/2012   LDLCALC 55 05/29/2011   Lab Results  Component Value Date   TRIG 48.0 11/28/2012   TRIG 38 05/29/2011   Lab Results  Component Value Date   CHOLHDL 2 11/28/2012   CHOLHDL 2.1 05/29/2011   No results found for this basename: LDLDIRECT   No results found for this basename: DDIMER   No results found for this basename: INR,  in the last 72 hours  Disposition:  The patient is being discharged in stable condition.  Follow-up: Follow-up Information   Follow up with Rick Duff, PA-C On 07/07/2013. (At 8:30 AM)    Contact information:   [facility] - Dr. Lubertha Basque PA [address] [place] [address] [phone]      Discharge Medications:    Medication List         amiodarone 200 MG tablet  Commonly known as:  PACERONE  Take 1 tablet (200 mg total) by mouth daily.     aspirin 81 MG tablet  Take 81 mg by mouth daily.     atorvastatin 80 MG tablet  Commonly known as:  LIPITOR  Take 80 mg by mouth daily.  CALCIUM-MAGNESIUM-ZINC PO  Take 3 tablets by mouth at bedtime. 1000 mg calcium, 400 mg magnesium, 25 mg zinc     carvedilol 6.25 MG tablet  Commonly known as:  COREG  Take 0.5 tablets (3.125 mg total) by mouth 2 (two) times daily with a meal.     clopidogrel 75 MG tablet  Commonly known as:  PLAVIX  Take 75 mg by mouth daily.     EYE VITAMINS PO  Take 1 capsule by mouth 2 (two) times daily.     fexofenadine 180 MG tablet  Commonly known as:  ALLEGRA  Take 180 mg by mouth daily as needed (for allergies).     Fish Oil 1200 MG Caps  Take 1 capsule by mouth daily.     furosemide 20 MG tablet  Commonly known as:  LASIX  Take 20 mg by mouth daily.     levothyroxine 112 MCG tablet  Commonly known as:  SYNTHROID  Take 1 tablet (112 mcg total) by mouth daily.     multivitamin tablet  Take 1 tablet by mouth daily.     niacin 750 MG CR tablet   Commonly known as:  NIASPAN  Take 1,500 mg by mouth at bedtime.     nitroGLYCERIN 0.4 MG SL tablet  Commonly known as:  NITROSTAT  Place 0.4 mg under the tongue every 5 (five) minutes as needed for chest pain. For chest pain     ramipril 2.5 MG capsule  Commonly known as:  ALTACE  Take 1 capsule (2.5 mg total) by mouth daily.        Duration of Discharge Encounter: Greater than 30 minutes including physician time.  Signed, Rick Duff, PA-C 06/09/2013, 8:32 AM  EP Attending  Patient seen and examined. Agree with above.  Leonia Reeves.D.

## 2013-06-09 NOTE — Op Note (Signed)
NAMEOZIE, LUPE NO.:  [id number]  MEDICAL RECORD NO.:  [id number]  LOCATION:  6C07C                        FACILITY:  [facility]  PHYSICIAN:  Doylene Canning. Ladona Ridgel, MD    DATE OF BIRTH:  [birth year]  DATE OF PROCEDURE: DATE OF DISCHARGE:                              OPERATIVE REPORT   PROCEDURE PERFORMED:  Electrophysiologic study and RF catheter ablation of sustained incessant monomorphic ventricular tachycardia utilizing 3D electro anatomic mapping.  INDICATIONS:  Sustained monomorphic ventricular tachycardia.  INTRODUCTION:  The patient is a 72 year old male with ischemic cardiomyopathy, status post ICD insertion with multiple episodes of ventricular tachycardia.  He was actually admitted to the hospital several days ago with a perforated right atrial pacing lead.  He was subsequently revised and he was hemodynamically stable, except the patient has had incessant ventricular tachycardia at a rate of 100 beats per minute.  He has been ventricular tachycardia approximately 20-24 hours per day despite intravenous amiodarone as well as oral mexiletine therapy.  He is now referred for catheter ablation.  PROCEDURE:  After informed was obtained, the patient was taken to the diagnostic EP lab in a fasting state.  After usual preparation and draping, intravenous fentanyl and midazolam were given for sedation.  A 6-French quadripolar catheter was inserted percutaneously in the right femoral vein and advanced to the right ventricle.  A 6-French quadripolar catheter was inserted percutaneously in the right femoral vein and advanced to the His bundle region.  A 6-French quadripolar catheter was inserted percutaneously in the right femoral vein and advanced to coronary sinus.  During catheter insertion, the patient was in sustained ventricular tachycardia.  Initial attempts to place the ablation catheter by way of the right femoral artery were unsuccessful. On multiple  occasions, we could easily puncture the artery.  The guidewire would not advance through.  Pressure was held and the left femoral artery was punctured.  A 7-French 8-mm quadripolar ablation catheter was advanced percutaneously by way of the right femoral artery, across the aortic valve into the left ventricle.  A 3D electro anatomic mapping was then carried out of the left ventricle.  This demonstrated extensive scar in the posterolateral base.  Earliest ventricular activation during the tachycardia occurred at approximately 2 o'clock in the LAO and between 2 and 3 o'clock in the LAO projection and almost back to the mitral valve annulus.  In this location, there are multiple fractionated electrograms.  Also this location was area of a large old scar.  It appeared that the ventricular tachycardia was breaking out from the dense scar and RF energy was subsequently applied.  The total 10 RF energy applications were delivered creating a line near the mitral valve annulus all the way down to the scar itself.  During RF energy application, the incessant ventricular tachycardia terminated and sinus rhythm was restored.  With cessation of ventricular tachycardia, it could not be re-induced.  Several Bonus RF energy applications were then delivered and programmed ventricular stimulation, rapid ventricular pacing, rapid atrial pacing, and programed atrial stimulation was carried out.  S1-S2 and S1, S2, S3 intervals were stepwise carried out at a base drive cycle length of 098 milliseconds, and all  the way down to ventricular refractoriness.  There was no inducible VT following catheter ablation.  Because of ventricular tachycardia had been easily induced with manipulation of the pacing catheter and spontaneously, manipulation of the catheter was then carried out, and despite extensive catheter manipulation in the left ventricle, no ventricular tachycardia was demonstrated.  The patient was observed  for approximately 30 minutes, and catheter manipulation and programmed ventricular stimulation was carried out, but there were no additional ventricular arrhythmias noted.  At this point, the catheters were removed, and the patient was returned to the recovery area for removal of the sheaths pending normalization of the ACT.  It should be noted that once the ablation catheter was placed in the left femoral artery, 6000 units of heparin were delivered and during the procedure.  ACTs were evaluated to keep the ACT above 200-225 seconds.  COMPLICATIONS:  There were no immediate procedure complications.  RESULTS:  A.  Baseline ECG.  Baseline ECG demonstrates ventricular tachycardia at 100 beats per minute. B.  Baseline intervals.  During sinus rhythm, the sinus node cycle length was 690 milliseconds, the HV interval was 82 milliseconds.  The QRS duration was 190 milliseconds and the AH interval was 80 milliseconds. C.  Rapid ventricular pacing.  Rapid ventricular pacing following catheter ablation demonstrated VA dissociation at baseline.  Paced cycle length of 600 milliseconds. D.  Programmed ventricular stimulation.  Programmed ventricular stimulation was carried out at a base drive cycle length of 161 milliseconds demonstrating VA dissociation. E.  Programmed atrial stimulation.  Following catheter ablation, programmed atrial stimulation was carried out from the atrium at a paced cycle length of 600 milliseconds and the S1-S2 interval was stepwise decreased down to 300 milliseconds where the AV node ERP was observed during programmed atrial stimulation.  There are no AH jumps, no echo beats, and no inducible SVT. F.  Rapid atrial pacing.  Following ablation, rapid atrial pacing was carried out from the atrium at pacing cycle length of 600 milliseconds and stepwise decreased down to 450 milliseconds where AV Wenckebach was observed.  During rapid atrial pacing, the PR interval was  equal to the RR interval but there was no inducible SVT. G.  Arrhythmias observed #1 ventricular tachycardia.  Initiation was spontaneous with catheter manipulation.  The duration was sustained. Cycle length was approximately 600 milliseconds, and the method of termination was spontaneous as well as with catheter ablation. H.  Mapping.  Mapping of the ventricular tachycardia, demonstrated earliest activation of the ventricle at approximately 2-3 o'clock in the LAO projection, very close to the mitral valve annulus in the peripheral basal lateral section of the left ventricle. 1. RF energy application.  A total of 10 RF energy applications were     delivered to the area from the mitral valve annulus, lumbar     posterolateral base of the left ventricle down to the dense scar.     During RF energy application, the VT was terminated and could not     be reinduced.  CONCLUSION:  Study demonstrates successful electrophysiologic study and RF catheter ablation of sustained and incessant, ventricular tachycardia with 10 RF energy applications delivered to the posterior basal lateral wall of the left ventricle.     Doylene Canning. Ladona Ridgel, MD     GWT/MEDQ  D:  06/08/2013  T:  06/09/2013  Job:  [id number]

## 2013-06-09 NOTE — Progress Notes (Signed)
   ELECTROPHYSIOLOGY ROUNDING NOTE    Patient Name: [name] Date of Encounter: 06/09/2013    SUBJECTIVE:Patient feels well this morning.  No chest pain or shortness of breath.  S/p VT ablation 06-08-2013-- no further VT since ablation.   Pt currently off Amiodarone and Mexilitine-- was on Amiodarone 400mg  5X/week and 200mg  2X/week prior to admission.   TELEMETRY: Reviewed telemetry pt atrial pacing with intrinsic ventricular conduction-- no VT since ablation yesterday Filed Vitals:   06/08/13 2300 06/09/13 0021 06/09/13 0200 06/09/13 0400  BP: 99/46 96/51 104/41 110/44  Pulse: 60 60 60 61  Temp:  98.1 F (36.7 C)  97.5 F (36.4 C)  TempSrc:  Oral  Oral  Resp: 18 16 18 18   Height:      Weight:  206 lb 5.6 oz (93.6 kg)    SpO2: 93% 95% 97% 97%    Intake/Output Summary (Last 24 hours) at 06/09/13 0646 Last data filed at 06/08/13 2215  Gross per 24 hour  Intake 535.48 ml  Output    425 ml  Net 110.48 ml    CURRENT MEDICATIONS: . aspirin  81 mg Oral Daily  . atorvastatin  80 mg Oral Daily  . clopidogrel  75 mg Oral Daily  . furosemide  20 mg Oral Daily  . levothyroxine  112 mcg Oral QAC breakfast    LABS: Basic Metabolic Panel:  Recent Labs  16/10/96 0400  NA 136  K 4.2  CL 102  CO2 27  GLUCOSE 105*  BUN 19  CREATININE 1.58*  CALCIUM 8.6  MG 2.0   CBC:  Recent Labs  06/08/13 1410  WBC 8.4  HGB 11.6*  HCT 34.2*  MCV 101.5*  PLT 113*    Radiology/Studies:  Dg Chest Portable 1 View 06/08/2013   *RADIOLOGY REPORT*  Clinical Data: Post implantation question pneumothorax  PORTABLE CHEST - 1 VIEW  Comparison: Portable exam 8-53 hours compared to 06/06/2013  Findings: Right arm PICC line tip projects over SVC. Left subclavian AICD leads project over right atrium and right ventricle. Enlargement of cardiac silhouette with pulmonary vascular congestion. Atherosclerotic calcification aorta. Lungs grossly clear. No pleural effusion or pneumothorax. Bones  demineralized.  IMPRESSION: Enlargement of cardiac silhouette post AICD. No acute abnormalities.   Original Report Authenticated By: Ulyses Southward, M.D.   PHYSICAL EXAM Well appearing 72 yo man, NAD HEENT: Unremarkable Neck:  6 cm JVD, no thyromegally Back:  No CVA tenderness Lungs:  Clear with no wheezes HEART:  Regular rate rhythm, no murmurs, no rubs, no clicks Abd:  Flat, positive bowel sounds, no organomegally, no rebound, no guarding Ext:  2 plus pulses, no edema, no cyanosis, no clubbing, groin with no hematoma Skin:  No rashes no nodules Neuro:  CN II through XII intact, motor grossly intact    A/P  Active Problems:   VENTRICULAR TACHYCARDIA    implantable cardiac defibrillator  [employer]   Ischemic cardiomyopathy   Sinus bradycardia   Rec: he is doing well s/p catheter ablation. Plan to discharge home later today. He will stop Mexitil and continue amio 200 mg daily.  Leonia Reeves.D.

## 2013-06-10 ENCOUNTER — Other Ambulatory Visit: Payer: Self-pay

## 2013-06-10 ENCOUNTER — Encounter: Payer: Self-pay | Admitting: Internal Medicine

## 2013-06-10 ENCOUNTER — Ambulatory Visit (INDEPENDENT_AMBULATORY_CARE_PROVIDER_SITE_OTHER): Payer: Medicare Other | Admitting: *Deleted

## 2013-06-10 DIAGNOSIS — I255 Ischemic cardiomyopathy: Secondary | ICD-10-CM

## 2013-06-10 DIAGNOSIS — I472 Ventricular tachycardia: Secondary | ICD-10-CM

## 2013-06-10 DIAGNOSIS — I2589 Other forms of chronic ischemic heart disease: Secondary | ICD-10-CM

## 2013-06-10 LAB — ICD DEVICE OBSERVATION
DEV-0020ICD: NEGATIVE
RV LEAD THRESHOLD: 0.75 V
TZON-0003SLOWVT: 500 ms

## 2013-06-10 LAB — POCT ACTIVATED CLOTTING TIME
Activated Clotting Time: 211 seconds
Activated Clotting Time: 252 seconds

## 2013-06-10 NOTE — Progress Notes (Signed)
ICD check in office. 

## 2013-06-12 ENCOUNTER — Ambulatory Visit: Payer: Medicare Other

## 2013-06-18 ENCOUNTER — Ambulatory Visit (INDEPENDENT_AMBULATORY_CARE_PROVIDER_SITE_OTHER): Payer: Medicare Other | Admitting: Internal Medicine

## 2013-06-18 ENCOUNTER — Encounter: Payer: Self-pay | Admitting: Internal Medicine

## 2013-06-18 VITALS — BP 92/58 | HR 61 | Ht 72.0 in | Wt 199.8 lb

## 2013-06-18 DIAGNOSIS — I2589 Other forms of chronic ischemic heart disease: Secondary | ICD-10-CM

## 2013-06-18 DIAGNOSIS — Z95810 Presence of automatic (implantable) cardiac defibrillator: Secondary | ICD-10-CM

## 2013-06-18 DIAGNOSIS — I2510 Atherosclerotic heart disease of native coronary artery without angina pectoris: Secondary | ICD-10-CM

## 2013-06-18 DIAGNOSIS — I255 Ischemic cardiomyopathy: Secondary | ICD-10-CM

## 2013-06-18 DIAGNOSIS — I472 Ventricular tachycardia: Secondary | ICD-10-CM

## 2013-06-18 DIAGNOSIS — R001 Bradycardia, unspecified: Secondary | ICD-10-CM

## 2013-06-18 DIAGNOSIS — I498 Other specified cardiac arrhythmias: Secondary | ICD-10-CM

## 2013-06-18 LAB — ICD DEVICE OBSERVATION
AL IMPEDENCE ICD: 418 Ohm
ATRIAL PACING ICD: 90.7 pct
RV LEAD IMPEDENCE ICD: 608 Ohm
VENTRICULAR PACING ICD: 0.3 pct

## 2013-06-18 NOTE — Assessment & Plan Note (Signed)
Improved following atrial lead placement and chronotropic response

## 2013-06-18 NOTE — Patient Instructions (Signed)
Your physician recommends that you schedule a follow-up appointment in: 2 to 3 months with Dr. Graciela Husbands  Your physician recommends that you continue on your current medications as directed. Please refer to the Current Medication list given to you today.

## 2013-06-18 NOTE — Assessment & Plan Note (Signed)
Continue current medications and maintain him on carvedilol. Next visit he will need a metabolic profile

## 2013-06-18 NOTE — Assessment & Plan Note (Signed)
The patient's device was interrogated and the information was fully reviewed.  The device was reprogrammed As above  

## 2013-06-18 NOTE — Addendum Note (Signed)
Addended by: Glenda Chroman on: 06/18/2013 09:57 AM   Modules accepted: Level of Service

## 2013-06-18 NOTE — Progress Notes (Signed)
Patient Care Team: [name] as PCP - General (Family Medicine)   HPI  [name] is a 72 y.o. male Seen in followup for ventricular tachycardia occurning in the setting of ischemic heart disease with prior revascularization and prior MI. He had clinical recurrent VT in the summer 2012 associated with a decrease in his amiodarone dose. This resulted in ICD discharge  Catheterization at that time demonstrated Double-vessel coronary artery disease with Patent stent in the mid circumflex artery and Chronic total occlusion of the mid right coronary artery  Ejection fraction of 25-30% with moderate MR  Scan--large inferior wall infarct with severely depressed LV function and ejection fraction of 24%  Because of recurrent VT underwent catheter ablation November 2012 . The VT was localized to the scar border on the inferior wall of the left ventricle about halfway from base to apex. Following ablation, it was no longer inducible. He did have 2 other nonclinical VTs     He underwent device generator replacement at [facility] upgrade to a dual-chamber system because of symptomatic bradycardia.  This was complicated by atrial lead dislodgment versus perforated which prompted repositioning. He was also found to be in incessant VT he underwent VT ablation again been rendered noninducible. 48 hours later he had ventricular tachycardia unresponsive to ATP was shocked. The device was reprogrammed with shocks eliminated from the VT 1 zone and carvedilol reinitiated    Past Medical History  Diagnosis Date  . CAD (coronary artery disease)     s/p MI 1982;  s/p DES to CFX 2011;  s/p NSTEMI 4/12 in setting of VTach;  cath 7/12:  LM ok, LAD mid 30-40%, Dx's with 40%, mCFX stent patent, prox to mid RCA occluded with R to R and L to R collats.  Medical Tx was continued  . Bradycardia   . Ventricular tachycardia     s/p AICD;   h/o ICD shock;  Amiodarone Rx. S/P ablation in 2012  . PVD (peripheral vascular  disease)     h/o claudication;  s/p R fem to pop BPG 3/11;  s/p L CFA BPG 7/03;  s/p repair of inf AAA 6/03;  s/p aorta to bilat renal BPG 6/03  . HTN (hypertension)   . HLD (hyperlipidemia)   . Cytopenia   . Hypothyroidism   . Renal insufficiency   . Claudication   . Chronic systolic heart failure   . Ischemic cardiomyopathy     echo 7/12:  EF 25-30%, mild AI, mod MR, mod LAE  . TIA (transient ischemic attack) 2001  . CVD (cerebrovascular disease)     left CEA 2008  . Stroke   . Anemia   . Inappropriate therapy from implantable cardioverter-defibrillator 04/02/2013    ATP for sinus tach  SVT wavelet identified SVT but was no  passive   . Myocardial infarction 1992  . Anginal pain   . Automatic implantable cardioverter-defibrillator in situ   . Sleep apnea     denies wearing mask (05/25/2013)  . Arthritis     "a little in my knees" (05/25/2013)  . Melanoma of ear 2013    "near left ear" (05/25/2013)    Past Surgical History  Procedure Laterality Date  . Femoral-popliteal bypass graft Right 2011  . Cardiac defibrillator placement  2003; 2005; 05/25/2013    [employer] "recalled in 2005 and replaced, replaced w/one that paces too"  . Revascularization / in-situ graft leg    . Renal artery bypass Bilateral 2003  . Back  surgery    . Lumbar disc surgery  1974; 2000's  . Knee arthroscopy Bilateral 1990's  . Elbow arthroscopy Right 1990's  . Cholecystectomy  12/15/?2010  . Lipoma excision Left 2013    "near ear" (05/25/2013)  . Heel spur surgery Right 1990's  . Cataract extraction w/ intraocular lens  implant, bilateral Bilateral 2000  . Carotid endarterectomy Left ~ 2007  . Doppler echocardiography  2011  . Tonsillectomy  1946  . Femoral-popliteal bypass graft Left 05/2002    Hattie Perch 05/06/2002 (05/25/2013)  . Tumor excision Left 1960's    "fatty tumor" (05/25/2013)  . Cardiac electrophysiology study and ablation  2012  . Coronary angioplasty  1992  . Cardiac catheterization       "several" (05/25/2013)  . Coronary angioplasty with stent placement      "last one was 11/2012  (05/25/2013)    Current Outpatient Prescriptions  Medication Sig Dispense Refill  . amiodarone (PACERONE) 200 MG tablet Take 1 tablet (200 mg total) by mouth daily.  30 tablet  4  . aspirin 81 MG tablet Take 81 mg by mouth daily.       [name] atorvastatin (LIPITOR) 80 MG tablet Take 80 mg by mouth daily.       [name] CALCIUM-MAGNESIUM-ZINC PO Take 3 tablets by mouth at bedtime. 1000 mg calcium, 400 mg magnesium, 25 mg zinc      . carvedilol (COREG) 6.25 MG tablet Take 6.25 mg by mouth 2 (two) times daily with a meal.      . clopidogrel (PLAVIX) 75 MG tablet Take 75 mg by mouth daily.       . fexofenadine (ALLEGRA) 180 MG tablet Take 180 mg by mouth daily as needed (for allergies).      . furosemide (LASIX) 20 MG tablet Take 20 mg by mouth daily.   1 tablet    . levothyroxine (SYNTHROID) 112 MCG tablet Take 1 tablet (112 mcg total) by mouth daily.      . Multiple Vitamin (MULTIVITAMIN) tablet Take 1 tablet by mouth daily.       . Multiple Vitamins-Minerals (EYE VITAMINS PO) Take 1 capsule by mouth 2 (two) times daily.       . niacin (NIASPAN) 750 MG CR tablet Take 1,500 mg by mouth at bedtime.      . nitroGLYCERIN (NITROSTAT) 0.4 MG SL tablet Place 0.4 mg under the tongue every 5 (five) minutes as needed for chest pain. For chest pain      . Omega-3 Fatty Acids (FISH OIL) 1200 MG CAPS Take 1 capsule by mouth daily.      . ramipril (ALTACE) 2.5 MG capsule Take 1 capsule (2.5 mg total) by mouth daily.  30 capsule  4   No current facility-administered medications for this visit.    Allergies  Allergen Reactions  . Meperidine Hcl Other (See Comments)    "CAN'T MOVE" CATATONIC PER PT.  [name] Opium Other (See Comments)    "CAN'T MOVE" CATATONIC PER PT.    Review of Systems negative except from HPI and PMH  Physical Exam BP 92/58  Pulse 61  Ht 6' (1.829 m)  Wt 199 lb 12.8 oz (90.629 kg)  BMI 27.09  kg/m2 Well developed and well nourished in no acute distress HENT normal E scleral and icterus clear Neck Supple JVP flat; carotids brisk and full Clear to ausculation Device pocket well healed; mild ecchymosis  There is no tethering   Soft with active bowel sounds No clubbing cyanosis none Edema  Alert and oriented, grossly normal motor and sensory function Skin Warm and Dry    Assessment and  Plan

## 2013-06-18 NOTE — Assessment & Plan Note (Signed)
Recurrent ventricular tachycardia treated with failed ATP and ICD discharge. I have made the ATP regime more aggressive than the hopes we can terminate very slow ventricular tachycardia

## 2013-06-19 ENCOUNTER — Ambulatory Visit: Payer: Medicare Other

## 2013-06-22 ENCOUNTER — Encounter: Payer: Self-pay | Admitting: Internal Medicine

## 2013-06-23 ENCOUNTER — Encounter: Payer: Medicare Other | Admitting: Internal Medicine

## 2013-07-07 ENCOUNTER — Encounter: Payer: Medicare Other | Admitting: Cardiology

## 2013-07-13 ENCOUNTER — Telehealth: Payer: Self-pay | Admitting: *Deleted

## 2013-07-13 ENCOUNTER — Encounter (HOSPITAL_COMMUNITY): Payer: Self-pay | Admitting: Emergency Medicine

## 2013-07-13 ENCOUNTER — Emergency Department (HOSPITAL_COMMUNITY): Payer: Medicare Other

## 2013-07-13 ENCOUNTER — Emergency Department (HOSPITAL_COMMUNITY)
Admission: EM | Admit: 2013-07-13 | Discharge: 2013-07-13 | Disposition: A | Discharge status: Home / Self Care | Payer: Medicare Other | Attending: Emergency Medicine | Admitting: Emergency Medicine

## 2013-07-13 DIAGNOSIS — Z7902 Long term (current) use of antithrombotics/antiplatelets: Secondary | ICD-10-CM | POA: Insufficient documentation

## 2013-07-13 DIAGNOSIS — Z888 Allergy status to other drugs, medicaments and biological substances status: Secondary | ICD-10-CM | POA: Insufficient documentation

## 2013-07-13 DIAGNOSIS — Z7982 Long term (current) use of aspirin: Secondary | ICD-10-CM | POA: Insufficient documentation

## 2013-07-13 DIAGNOSIS — Y939 Activity, unspecified: Secondary | ICD-10-CM | POA: Insufficient documentation

## 2013-07-13 DIAGNOSIS — Y929 Unspecified place or not applicable: Secondary | ICD-10-CM | POA: Insufficient documentation

## 2013-07-13 DIAGNOSIS — I5022 Chronic systolic (congestive) heart failure: Secondary | ICD-10-CM | POA: Insufficient documentation

## 2013-07-13 DIAGNOSIS — R42 Dizziness and giddiness: Secondary | ICD-10-CM | POA: Insufficient documentation

## 2013-07-13 DIAGNOSIS — I10 Essential (primary) hypertension: Secondary | ICD-10-CM | POA: Insufficient documentation

## 2013-07-13 DIAGNOSIS — Z79899 Other long term (current) drug therapy: Secondary | ICD-10-CM | POA: Insufficient documentation

## 2013-07-13 DIAGNOSIS — D649 Anemia, unspecified: Secondary | ICD-10-CM | POA: Insufficient documentation

## 2013-07-13 DIAGNOSIS — I959 Hypotension, unspecified: Secondary | ICD-10-CM | POA: Insufficient documentation

## 2013-07-13 DIAGNOSIS — Z95810 Presence of automatic (implantable) cardiac defibrillator: Secondary | ICD-10-CM | POA: Insufficient documentation

## 2013-07-13 DIAGNOSIS — R296 Repeated falls: Secondary | ICD-10-CM | POA: Insufficient documentation

## 2013-07-13 DIAGNOSIS — Z885 Allergy status to narcotic agent status: Secondary | ICD-10-CM | POA: Insufficient documentation

## 2013-07-13 DIAGNOSIS — G4730 Sleep apnea, unspecified: Secondary | ICD-10-CM | POA: Insufficient documentation

## 2013-07-13 DIAGNOSIS — E039 Hypothyroidism, unspecified: Secondary | ICD-10-CM | POA: Insufficient documentation

## 2013-07-13 DIAGNOSIS — I4729 Other ventricular tachycardia: Secondary | ICD-10-CM | POA: Insufficient documentation

## 2013-07-13 DIAGNOSIS — M129 Arthropathy, unspecified: Secondary | ICD-10-CM | POA: Insufficient documentation

## 2013-07-13 DIAGNOSIS — Z85820 Personal history of malignant melanoma of skin: Secondary | ICD-10-CM | POA: Insufficient documentation

## 2013-07-13 DIAGNOSIS — E785 Hyperlipidemia, unspecified: Secondary | ICD-10-CM | POA: Insufficient documentation

## 2013-07-13 DIAGNOSIS — R55 Syncope and collapse: Secondary | ICD-10-CM | POA: Insufficient documentation

## 2013-07-13 DIAGNOSIS — I2510 Atherosclerotic heart disease of native coronary artery without angina pectoris: Secondary | ICD-10-CM | POA: Insufficient documentation

## 2013-07-13 DIAGNOSIS — I739 Peripheral vascular disease, unspecified: Secondary | ICD-10-CM | POA: Insufficient documentation

## 2013-07-13 DIAGNOSIS — Z8673 Personal history of transient ischemic attack (TIA), and cerebral infarction without residual deficits: Secondary | ICD-10-CM | POA: Insufficient documentation

## 2013-07-13 DIAGNOSIS — I472 Ventricular tachycardia, unspecified: Secondary | ICD-10-CM | POA: Insufficient documentation

## 2013-07-13 DIAGNOSIS — T82198A Other mechanical complication of other cardiac electronic device, initial encounter: Secondary | ICD-10-CM | POA: Insufficient documentation

## 2013-07-13 DIAGNOSIS — Z87891 Personal history of nicotine dependence: Secondary | ICD-10-CM | POA: Insufficient documentation

## 2013-07-13 DIAGNOSIS — I2589 Other forms of chronic ischemic heart disease: Secondary | ICD-10-CM | POA: Insufficient documentation

## 2013-07-13 DIAGNOSIS — N289 Disorder of kidney and ureter, unspecified: Secondary | ICD-10-CM | POA: Insufficient documentation

## 2013-07-13 DIAGNOSIS — I252 Old myocardial infarction: Secondary | ICD-10-CM | POA: Insufficient documentation

## 2013-07-13 LAB — BASIC METABOLIC PANEL
BUN: 22 mg/dL (ref 6–23)
Chloride: 100 mEq/L (ref 96–112)
Creatinine, Ser: 1.89 mg/dL — ABNORMAL HIGH (ref 0.50–1.35)
GFR calc Af Amer: 39 mL/min — ABNORMAL LOW (ref >90)
GFR calc non Af Amer: 34 mL/min — ABNORMAL LOW (ref >90)
Glucose, Bld: 118 mg/dL — ABNORMAL HIGH (ref 70–99)

## 2013-07-13 LAB — POCT I-STAT TROPONIN I

## 2013-07-13 LAB — CBC
HCT: 38 % — ABNORMAL LOW (ref 39.0–52.0)
Hemoglobin: 12.5 g/dL — ABNORMAL LOW (ref 13.0–17.0)
MCHC: 32.9 g/dL (ref 30.0–36.0)
MCV: 101.3 fL — ABNORMAL HIGH (ref 78.0–100.0)
RDW: 14.6 % (ref 11.5–15.5)

## 2013-07-13 MED ORDER — AMIODARONE HCL 200 MG PO TABS: 400.0000 mg | ORAL_TABLET | Freq: Every day | ORAL | Status: DC

## 2013-07-13 NOTE — ED Notes (Addendum)
Pt with syncope today with fall and injury to right elbow that is bandaged; pt sts dizziness prior to event; pt had recent pacemaker placed; pt on blood thinners

## 2013-07-13 NOTE — H&P (Addendum)
Patient ID: [name] MRN: [id number], DOB/AGE: 1941/01/20     Primary Physician: [name], MD Primary Cardiologist: Peter Swaziland, M.D.  Problem List: Past Medical History  Diagnosis Date  . CAD (coronary artery disease)     s/p MI 1982;  s/p DES to CFX 2011;  s/p NSTEMI 4/12 in setting of VTach;  cath 7/12:  LM ok, LAD mid 30-40%, Dx's with 40%, mCFX stent patent, prox to mid RCA occluded with R to R and L to R collats.  Medical Tx was continued  . Bradycardia   . Ventricular tachycardia     s/p AICD;   h/o ICD shock;  Amiodarone Rx. S/P ablation in 2012  . 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 HTN (hypertension)   . HLD (hyperlipidemia)   . Cytopenia   . Hypothyroidism   . Renal insufficiency   . Claudication   . Chronic systolic heart failure   . Ischemic cardiomyopathy     echo 7/12:  EF 25-30%, mild AI, mod MR, mod LAE  . TIA (transient ischemic attack) 2001  . CVD (cerebrovascular disease)     left CEA 2008  . Stroke   . Anemia   . Inappropriate therapy from implantable cardioverter-defibrillator 04/02/2013    ATP for sinus tach  SVT wavelet identified SVT but was no  passive   . Myocardial infarction 1992  . Anginal pain   . Automatic implantable cardioverter-defibrillator in situ   . Sleep apnea     denies wearing mask (05/25/2013)  . Arthritis     "a little in my knees" (05/25/2013)  . Melanoma of ear 2013    "near left ear" (05/25/2013)    Past Surgical History  Procedure Laterality Date  . Femoral-popliteal bypass graft Right 2011  . Cardiac defibrillator placement  2003; 2005; 05/25/2013    Medtronic "recalled in 2005 and replaced, replaced w/one that paces too"  . Revascularization / in-situ graft leg    . Renal artery bypass Bilateral 2003  . Back surgery    . Lumbar disc surgery  1974; 2000's  . Knee  arthroscopy Bilateral 1990's  . Elbow arthroscopy Right 1990's  . Cholecystectomy  12/15/?2010  . Lipoma excision Left 2013    "near ear" (05/25/2013)  . Heel spur surgery Right 1990's  . Cataract extraction w/ intraocular lens  implant, bilateral Bilateral 2000  . Carotid endarterectomy Left ~ 2007  . Doppler echocardiography  2011  . Tonsillectomy  1946  . Femoral-popliteal bypass graft Left 05/2002    Hattie Perch 05/06/2002 (05/25/2013)  . Tumor excision Left 1960's    "fatty tumor" (05/25/2013)  . Cardiac electrophysiology study and ablation  2012  . Coronary angioplasty  1992  . Cardiac catheterization      "several" (05/25/2013)  . Coronary angioplasty with stent placement      "last one was 11/2012  (05/25/2013)     Allergies:  Allergies  Allergen Reactions  . Meperidine Hcl Other (See Comments)    "CAN'T MOVE" CATATONIC PER PT.  [name] Opium Other (See Comments)    "CAN'T MOVE" CATATONIC PER PT.    HPI: Patient is a 72 yo with a history of ischemic heart disease Last cath in 2012 LM OK; LAD mid 30-40%, Dx's with 40%, mCFX stent patent, prox  to mid RCA occluded with R to R and L to R collats)  He also has a history of VT  S/p ICD  S/p ablation 2012  The patient undewent generator change due to ERI  Upgraded to dual chamber system due to bradycardia.  Procedure complicated by atrial lead dislodgment vs perforation.  Had incessent VT.  Underwent repeat ablation.  48 hours later had recurent VT  Device reprogrammed.  Carevedilol reinitiated.  He was seen by Odessa Fleming on 06/18/13  He has been doing good.  Denies CP  No SOB No nausea. Today was at work at [employer].  Getting into golf cart.  Felt dizzy  Then passed out.  No palpitations.  NO SOB  No CP.    Inpatient Medications:   Family History  Problem Relation Age of Onset  . Coronary artery disease    . Heart attack Mother   . Coronary artery disease Mother   . Coronary artery disease Father   . Stroke Father      History   Social  History  . Marital Status: Divorced    Spouse Name: N/A    Number of Children: 2  . Years of Education: N/A   Occupational History  . retired     [employer]  . Retired     [employer]  . Engineer, structural at [employer]   Social History Main Topics  . Smoking status: Former Smoker -- 0.50 packs/day for 37 years    Types: Cigarettes    Quit date: 11/05/2001  . Smokeless tobacco: Never Used  . Alcohol Use: 0.0 oz/week     Comment: 05/25/2013 "mixed drink couple times/month"  . Drug Use: No  . Sexual Activity: No   Other Topics Concern  . Not on file   Social History Narrative   Lives with sig other.     Review of Systems: All other systems reviewed and are otherwise negative except as noted above.  Physical Exam: Filed Vitals:   07/13/13 1126  BP:   Pulse: 59  Temp:   Resp: 11   No intake or output data in the 24 hours ending 07/13/13 1255  General: Well developed, well nourished, in no acute distress. Head: Normocephalic, atraumatic, sclera non-icteric Neck: Negative for carotid bruits. JVP not elevated. Lungs: Clear bilaterally to auscultation without wheezes, rales, or rhonchi. Breathing is unlabored. Heart: RRR with S1 S2. No murmurs, rubs, or gallops appreciated. Abdomen: Soft, non-tender, non-distended with normoactive bowel sounds. No hepatomegaly. No rebound/guarding. No obvious abdominal masses. Msk:  Strength and tone appears normal for age. Extremities: No clubbing, cyanosis or edema.  Distal pedal pulses are 2+ and equal bilaterally. Neuro: Alert and oriented X 3. Moves all extremities spontaneously. Psych:  Responds to questions appropriately with a normal affect.  Labs: Results for orders placed during the hospital encounter of 07/13/13 (from the past 24 hour(s))  CBC     Status: Abnormal   Collection Time    07/13/13 10:40 AM      Result Value Range   WBC 5.6  4.0 - 10.5 K/uL   RBC 3.75 (*) 4.22 - 5.81 MIL/uL   Hemoglobin  12.5 (*) 13.0 - 17.0 g/dL   HCT 09.8 (*) 11.9 - 14.7 %   MCV 101.3 (*) 78.0 - 100.0 fL   MCH 33.3  26.0 - 34.0 pg   MCHC 32.9  30.0 - 36.0 g/dL   RDW 82.9  56.2 - 13.0 %  Platelets 115 (*) 150 - 400 K/uL  BASIC METABOLIC PANEL     Status: Abnormal   Collection Time    07/13/13 10:40 AM      Result Value Range   Sodium 134 (*) 135 - 145 mEq/L   Potassium 4.6  3.5 - 5.1 mEq/L   Chloride 100  96 - 112 mEq/L   CO2 27  19 - 32 mEq/L   Glucose, Bld 118 (*) 70 - 99 mg/dL   BUN 22  6 - 23 mg/dL   Creatinine, Ser 3.24 (*) 0.50 - 1.35 mg/dL   Calcium 9.2  8.4 - 40.1 mg/dL   GFR calc non Af [name] 34 (*) >90 mL/min   GFR calc Af [name] 39 (*) >90 mL/min  PROTIME-INR     Status: None   Collection Time    07/13/13 10:40 AM      Result Value Range   Prothrombin Time 15.2  11.6 - 15.2 seconds   INR 1.23  0.00 - 1.49  POCT I-STAT TROPONIN I     Status: None   Collection Time    07/13/13 10:43 AM      Result Value Range   Troponin i, poc 0.03  0.00 - 0.08 ng/mL   Comment 3             Radiology/Studies: Dg Chest 2 View  07/13/2013   CLINICAL DATA:  Syncope.  Defibrillator placement 1 month ago.  EXAM: CHEST  2 VIEW  COMPARISON:  06/08/2013  FINDINGS: Left AICD remains in place, unchanged. No pneumothorax. Heart and mediastinal contours are within normal limits. No focal opacities or effusions. No acute bony abnormality.  IMPRESSION: No active cardiopulmonary disease.   Electronically Signed   By: Charlett Nose M.D.   On: 07/13/2013 11:05  ICD interrogation - interrogation of the ICD is quite revealing. The patient had a 1-1 atrial tachycardia. The rate was 160 beats per minute. Anti-tachycardic pacing therapy was delivered, resulting in a VAAV conduction sequence. A second round of pacing resulted in acceleration of the ventricular tachycardia into the ventricular fibrillation zone, resulting in a single successful ICD shock. Review of her histograms demonstrates that the patient has had atrial  tachycardia at rates of 160-170 beats per minute for the last several days.  EKG: Normal sinus rhythm with P. synchronous ventricular pacing  ASSESSMENT AND PLAN: 1. atrial tachycardia with one-to-one AV conduction 2. ventricular pacing induced to ventricular tachycardia with successful ICD shock 3. Ischemic cardiomyopathy 4. Sinus bradycardia 5. ICD implantation, status post interrogation, with reprogramming.  Discussion: Today to prevent further therapy for atrial tachycardia, returned on the one-to-one algorithm which will prevent anti-tachycardic pacing. In addition, we lowered the ventricular fibrillation zone to 176 beats per minute. Finally, I've asked the patient to increase his amiodarone dose to 400 mg daily. We'll reschedule followup with Dr. Graciela Husbands in several weeks to see how he is doing. The patient will not require inpatient evaluation.  Lewayne Bunting, M.D.

## 2013-07-13 NOTE — Telephone Encounter (Signed)
Patient's wife called to report that he passed out 2 times today but defib did not fire. She was in the car with him. I advised her to take him to to the [facility] ER for treatment. [facility]master notified.

## 2013-07-13 NOTE — ED Provider Notes (Signed)
CSN: [id number]     Arrival date & time 07/13/13  1018 History   First MD Initiated Contact with Patient 07/13/13 1049     Chief Complaint  Patient presents with  . Loss of Consciousness   (Consider location/radiation/quality/duration/timing/severity/associated sxs/prior Treatment) Patient is a 72 y.o. male presenting with syncope. The history is provided by the patient.  Loss of Consciousness Associated symptoms: no chest pain, no headaches, no nausea, no shortness of breath, no vomiting and no weakness    patient presents with a syncopal episode. He states that he was at work and began to feel lightheaded and passed out. He fell from sitting to laying down. No chest pain. He has an AICD for ventricular tachycardia. He was replaced around a month ago. His been doing well the last couple days.  Past Medical History  Diagnosis Date  . CAD (coronary artery disease)     s/p MI 1982;  s/p DES to CFX 2011;  s/p NSTEMI 4/12 in setting of VTach;  cath 7/12:  LM ok, LAD mid 30-40%, Dx's with 40%, mCFX stent patent, prox to mid RCA occluded with R to R and L to R collats.  Medical Tx was continued  . Bradycardia   . Ventricular tachycardia     s/p AICD;   h/o ICD shock;  Amiodarone Rx. S/P ablation in 2012  . 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 HTN (hypertension)   . HLD (hyperlipidemia)   . Cytopenia   . Hypothyroidism   . Renal insufficiency   . Claudication   . Chronic systolic heart failure   . Ischemic cardiomyopathy     echo 7/12:  EF 25-30%, mild AI, mod MR, mod LAE  . TIA (transient ischemic attack) 2001  . CVD (cerebrovascular disease)     left CEA 2008  . Stroke   . Anemia   . Inappropriate therapy from implantable cardioverter-defibrillator 04/02/2013    ATP for sinus tach  SVT wavelet identified SVT but was no  passive   . Myocardial infarction  1992  . Anginal pain   . Automatic implantable cardioverter-defibrillator in situ   . Sleep apnea     denies wearing mask (05/25/2013)  . Arthritis     "a little in my knees" (05/25/2013)  . Melanoma of ear 2013    "near left ear" (05/25/2013)   Past Surgical History  Procedure Laterality Date  . Femoral-popliteal bypass graft Right 2011  . Cardiac defibrillator placement  2003; 2005; 05/25/2013    Medtronic "recalled in 2005 and replaced, replaced w/one that paces too"  . Revascularization / in-situ graft leg    . Renal artery bypass Bilateral 2003  . Back surgery    . Lumbar disc surgery  1974; 2000's  . Knee arthroscopy Bilateral 1990's  . Elbow arthroscopy Right 1990's  . Cholecystectomy  12/15/?2010  . Lipoma excision Left 2013    "near ear" (05/25/2013)  . Heel spur surgery Right 1990's  . Cataract extraction w/ intraocular lens  implant, bilateral Bilateral 2000  . Carotid endarterectomy Left ~ 2007  . Doppler echocardiography  2011  . Tonsillectomy  1946  . Femoral-popliteal bypass graft Left 05/2002    Hattie Perch 05/06/2002 (05/25/2013)  . Tumor excision Left 1960's    "fatty tumor" (05/25/2013)  . Cardiac  electrophysiology study and ablation  2012  . Coronary angioplasty  1992  . Cardiac catheterization      "several" (05/25/2013)  . Coronary angioplasty with stent placement      "last one was 11/2012  (05/25/2013)   Family History  Problem Relation Age of Onset  . Coronary artery disease    . Heart attack Mother   . Coronary artery disease Mother   . Coronary artery disease Father   . Stroke Father    History  Substance Use Topics  . Smoking status: Former Smoker -- 0.50 packs/day for 37 years    Types: Cigarettes    Quit date: 11/05/2001  . Smokeless tobacco: Never Used  . Alcohol Use: 0.0 oz/week     Comment: 05/25/2013 "mixed drink couple times/month"    Review of Systems  Constitutional: Negative for activity change and appetite change.  HENT: Negative for  neck stiffness.   Eyes: Negative for pain.  Respiratory: Negative for chest tightness and shortness of breath.   Cardiovascular: Positive for syncope. Negative for chest pain and leg swelling.  Gastrointestinal: Negative for nausea, vomiting, abdominal pain and diarrhea.  Genitourinary: Negative for flank pain.  Musculoskeletal: Negative for back pain.  Skin: Negative for rash.  Neurological: Positive for syncope and light-headedness. Negative for weakness, numbness and headaches.  Psychiatric/Behavioral: Negative for behavioral problems.    Allergies  Meperidine hcl and Opium  Home Medications   Current Outpatient Rx  Name  Route  Sig  Dispense  Refill  . aspirin 81 MG tablet   Oral   Take 81 mg by mouth daily.          [name] atorvastatin (LIPITOR) 80 MG tablet   Oral   Take 80 mg by mouth daily.          [name] CALCIUM-MAGNESIUM-ZINC PO   Oral   Take 3 tablets by mouth at bedtime. 1000 mg calcium, 400 mg magnesium, 25 mg zinc         . carvedilol (COREG) 6.25 MG tablet   Oral   Take 6.25 mg by mouth 2 (two) times daily with a meal.         . clopidogrel (PLAVIX) 75 MG tablet   Oral   Take 75 mg by mouth daily.          . fexofenadine (ALLEGRA) 180 MG tablet   Oral   Take 180 mg by mouth daily as needed (for allergies).         . furosemide (LASIX) 20 MG tablet   Oral   Take 20 mg by mouth daily.    1 tablet      . levothyroxine (SYNTHROID) 112 MCG tablet   Oral   Take 1 tablet (112 mcg total) by mouth daily.         . Multiple Vitamin (MULTIVITAMIN) tablet   Oral   Take 1 tablet by mouth daily.          . Multiple Vitamins-Minerals (EYE VITAMINS PO)   Oral   Take 1 capsule by mouth 2 (two) times daily.          . niacin (NIASPAN) 750 MG CR tablet   Oral   Take 1,500 mg by mouth at bedtime.         . nitroGLYCERIN (NITROSTAT) 0.4 MG SL tablet   Sublingual   Place 0.4 mg under the tongue every 5 (five) minutes as needed for chest pain. For  chest pain         .  Omega-3 Fatty Acids (FISH OIL) 1200 MG CAPS   Oral   Take 1 capsule by mouth daily.         . ramipril (ALTACE) 2.5 MG capsule   Oral   Take 1 capsule (2.5 mg total) by mouth daily.   30 capsule   4   . amiodarone (PACERONE) 200 MG tablet   Oral   Take 2 tablets (400 mg total) by mouth daily.   30 tablet   4    BP 99/60  Pulse 59  Temp(Src) 98.3 F (36.8 C) (Oral)  Resp 11  SpO2 96% Physical Exam  Nursing note and vitals reviewed. Constitutional: He is oriented to person, place, and time. He appears well-developed and well-nourished.  Mild hypotension.  HENT:  Head: Normocephalic and atraumatic.  Eyes: EOM are normal. Pupils are equal, round, and reactive to light.  Neck: Normal range of motion. Neck supple.  Cardiovascular: Normal rate, regular rhythm and normal heart sounds.   No murmur heard. Pulmonary/Chest: Effort normal and breath sounds normal.  AICD left upper chest wall.  Abdominal: Soft. Bowel sounds are normal. He exhibits no distension and no mass. There is no tenderness. There is no rebound and no guarding.  Musculoskeletal: Normal range of motion. He exhibits no edema.  Neurological: He is alert and oriented to person, place, and time. No cranial nerve deficit.  Skin: Skin is warm and dry.  Psychiatric: He has a normal mood and affect.    ED Course  Procedures (including critical care time) Labs Review Labs Reviewed  CBC - Abnormal; Notable for the following:    RBC 3.75 (*)    Hemoglobin 12.5 (*)    HCT 38.0 (*)    MCV 101.3 (*)    Platelets 115 (*)    All other components within normal limits  BASIC METABOLIC PANEL - Abnormal; Notable for the following:    Sodium 134 (*)    Glucose, Bld 118 (*)    Creatinine, Ser 1.89 (*)    GFR calc non Af [name] 34 (*)    GFR calc Af [name] 39 (*)    All other components within normal limits  PROTIME-INR  POCT I-STAT TROPONIN I   Imaging Review Dg Chest 2 View  07/13/2013    CLINICAL DATA:  Syncope.  Defibrillator placement 1 month ago.  EXAM: CHEST  2 VIEW  COMPARISON:  06/08/2013  FINDINGS: Left AICD remains in place, unchanged. No pneumothorax. Heart and mediastinal contours are within normal limits. No focal opacities or effusions. No acute bony abnormality.  IMPRESSION: No active cardiopulmonary disease.   Electronically Signed   By: Charlett Nose M.D.   On: 07/13/2013 11:05    Date: 07/13/2013  Rate: 63  Rhythm: paced  QRS Axis: normal  Intervals: paced  ST/T Wave abnormalities: paced  Conduction Disutrbances:paced  Narrative Interpretation:   Old EKG Reviewed: unchanged   MDM   1. Syncope   2. Inappropriate shocks from implantable cardioverter-defibrillator, initial encounter    Patient syncope. His AICD. Seen by electrophysiology. Patient had a CT that was then converted to V. tach by his AICD. It then cardioverted him back out of both the atrial and ventricular arrhythmia. Amiodarone will be increased and patient was discharged home    Juliet Rude. Rubin Payor, MD 07/13/13 [phone]

## 2013-07-27 NOTE — Progress Notes (Signed)
ELECTROPHYSIOLOGY OFFICE NOTE  Patient ID: [name] MRN: [id number], DOB/AGE: [birth year]   Date of Visit: 07/28/2013  Primary Physician: Frank Albino, MD Primary Cardiologist / EP: [place], MD / Frank Husbands, MD Reason for Visit: Hospital follow-up  History of Present Illness  [name] is a 72 y.o. male with an ischemic CM s/p ICD implant, chronic systolic HF, paroxysmal VT, CAD and HTN who presents today for electrophysiology followup after being evaluated in the ED.   On 07/13/2013, [name] experienced syncope prompting him to present to [name]. He was evaluated by Dr. Ladona [name]. His device was interrogated which revealed an atrial tachycardia conducting 1:1. The rate was 160 beats per minute. Anti-tachycardic pacing therapy was delivered, resulting in a VAAV conduction sequence. A second round of pacing resulted in acceleration of the ventricular tachycardia into the ventricular fibrillation zone, which was successfully terminated with a single ICD shock. Review of his histograms demonstrated that he had atrial tachycardia at rates of 160-170 beats per minute for the last several days. To prevent further therapy for atrial tachycardia, Dr. Ladona [name] turned on the one-to-one algorithm which will prevent anti-tachycardic pacing. In addition, he lowered the ventricular fibrillation zone to 176 beats per minute. He also increased his amiodarone dose to 400 mg daily.   Since then, he reports he is doing well and has no complaints. He denies chest pain or shortness of breath. He denies palpitations, dizziness, near syncope or syncope. He denies LE swelling, orthopnea, PND or recent weight gain. He is compliant and tolerating medications without difficulty.  Past Medical History Past Medical History  Diagnosis Date  . CAD (coronary artery disease)     s/p MI 1982;  s/p DES to CFX 2011;  s/p NSTEMI 4/12 in setting of VTach;  cath 7/12:  LM ok, LAD mid 30-40%, Dx's with 40%, mCFX stent  patent, prox to mid RCA occluded with R to R and L to R collats.  Medical Tx was continued  . Bradycardia   . Ventricular tachycardia     s/p AICD;   h/o ICD shock;  Amiodarone Rx. S/P ablation in 2012  . 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 HTN (hypertension)   . HLD (hyperlipidemia)   . Cytopenia   . Hypothyroidism   . Renal insufficiency   . Claudication   . Chronic systolic heart failure   . Ischemic cardiomyopathy     echo 7/12:  EF 25-30%, mild AI, mod MR, mod LAE  . TIA (transient ischemic attack) 2001  . CVD (cerebrovascular disease)     left CEA 2008  . Stroke   . Anemia   . Inappropriate therapy from implantable cardioverter-defibrillator 04/02/2013    ATP for sinus tach  SVT wavelet identified SVT but was no  passive   . Myocardial infarction 1992  . Anginal pain   . Automatic implantable cardioverter-defibrillator in situ   . Sleep apnea     denies wearing mask (05/25/2013)  . Arthritis     "a little in my knees" (05/25/2013)  . Melanoma of ear 2013    "near left ear" (05/25/2013)    Past Surgical History Past Surgical History  Procedure Laterality Date  . Femoral-popliteal bypass graft Right 2011  . Cardiac defibrillator placement  2003; 2005; 05/25/2013  Medtronic "recalled in 2005 and replaced, replaced w/one that paces too"  . Revascularization / in-situ graft leg    . Renal artery bypass Bilateral 2003  . Back surgery    . Lumbar disc surgery  1974; 2000's  . Knee arthroscopy Bilateral 1990's  . Elbow arthroscopy Right 1990's  . Cholecystectomy  12/15/?2010  . Lipoma excision Left 2013    "near ear" (05/25/2013)  . Heel spur surgery Right 1990's  . Cataract extraction w/ intraocular lens  implant, bilateral Bilateral 2000  . Carotid endarterectomy Left ~ 2007  . Doppler echocardiography  2011  . Tonsillectomy  1946  .  Femoral-popliteal bypass graft Left 05/2002    [name] 05/06/2002 (05/25/2013)  . Tumor excision Left 1960's    "fatty tumor" (05/25/2013)  . Cardiac electrophysiology study and ablation  2012  . Coronary angioplasty  1992  . Cardiac catheterization      "several" (05/25/2013)  . Coronary angioplasty with stent placement      "last one was 11/2012  (05/25/2013)    Allergies/Intolerances Allergies  Allergen Reactions  . Meperidine Hcl Other (See Comments)    "CAN'T MOVE" CATATONIC PER PT.  [name] Opium Other (See Comments)    "CAN'T MOVE" CATATONIC PER PT.    Current Home Medications Current Outpatient Prescriptions  Medication Sig Dispense Refill  . amiodarone (PACERONE) 200 MG tablet Take 2 tablets (400 mg total) by mouth daily.  30 tablet  4  . aspirin 81 MG tablet Take 81 mg by mouth daily.       [name] atorvastatin (LIPITOR) 80 MG tablet Take 80 mg by mouth daily.       [name] CALCIUM-MAGNESIUM-ZINC PO Take 3 tablets by mouth at bedtime. 1000 mg calcium, 400 mg magnesium, 25 mg zinc      . carvedilol (COREG) 6.25 MG tablet Take 6.25 mg by mouth 2 (two) times daily with a meal.      . clopidogrel (PLAVIX) 75 MG tablet Take 75 mg by mouth daily.       . fexofenadine (ALLEGRA) 180 MG tablet Take 180 mg by mouth daily as needed (for allergies).      . furosemide (LASIX) 20 MG tablet Take 20 mg by mouth daily.   1 tablet    . levothyroxine (SYNTHROID) 112 MCG tablet Take 1 tablet (112 mcg total) by mouth daily.      . Multiple Vitamin (MULTIVITAMIN) tablet Take 1 tablet by mouth daily.       . Multiple Vitamins-Minerals (EYE VITAMINS PO) Take 1 capsule by mouth 2 (two) times daily.       . niacin (NIASPAN) 750 MG CR tablet Take 1,500 mg by mouth at bedtime.      . nitroGLYCERIN (NITROSTAT) 0.4 MG SL tablet Place 0.4 mg under the tongue every 5 (five) minutes as needed for chest pain. For chest pain      . Omega-3 Fatty Acids (FISH OIL) 1200 MG CAPS Take 1 capsule by mouth daily.      . ramipril (ALTACE)  2.5 MG capsule Take 1 capsule (2.5 mg total) by mouth daily.  30 capsule  4   No current facility-administered medications for this visit.    Social History Social History  . Marital Status: Divorced   Occupational History  . retired     [employer]  . Retired     [employer]  . Engineer, structural at [employer]   Social History Main Topics  .  Smoking status: Former Smoker -- 0.50 packs/day for 37 years    Types: Cigarettes    Quit date: 11/05/2001  . Smokeless tobacco: Never Used  . Alcohol Use: 0.0 oz/week     Comment: 05/25/2013 "mixed drink couple times/month"  . Drug Use: No   Review of Systems General: No chills, fever, night sweats or weight changes Cardiovascular: No chest pain, dyspnea on exertion, edema, orthopnea, palpitations, paroxysmal nocturnal dyspnea Dermatological: No rash, lesions or masses Respiratory: No cough, dyspnea Urologic: No hematuria, dysuria Abdominal: No nausea, vomiting, diarrhea, bright red blood per rectum, melena, or hematemesis Neurologic: No visual changes, weakness, changes in mental status All other systems reviewed and are otherwise negative except as noted above.  Physical Exam Vitals: Blood pressure 120/78, pulse 80, height 6' (1.829 m), weight 196 lb 1.9 oz (88.959 kg), SpO2 98.00%.  General: Well developed, well appearing 72 y.o. male in no acute distress. HEENT: Normocephalic, atraumatic. EOMs intact. Sclera nonicteric. Oropharynx clear.  Neck: Supple. No JVD. Lungs: Respirations regular and unlabored, CTA bilaterally. No wheezes, rales or rhonchi. Heart: RRR. S1, S2 present. No murmurs, rub, S3 or S4. Abdomen: Soft, non-distended.  Extremities: No clubbing, cyanosis or edema. PT/Radials 2+ and equal bilaterally. Psych: Normal affect. Neuro: Alert and oriented X 3. Moves all extremities spontaneously.   Diagnostics Device interrogation - quick look today - for rhythm follow-up after recent interrogation and  programming changes in ED. 2 VT episodes, ATP attempted. EGMs reveal 1:1 atrial tachycardia. Device delivered therapy inappropriately due to wavelet overriding 1:1 SVT algorithm. Reviewed with SK who was in to evaluate the patient and review EGMs. Turned wavelet to monitor. Extended VF detection interval to 200 bpm.   Assessment and Plan 1. Atrial tachycardia with 1:1 AV conduction  2. Ventricular pacing induced ventricular tachycardia with successful ICD shock  3. Ischemic cardiomyopathy  4. Sinus bradycardia   See device interrogation review above. Reviewed with SK who was in to evaluate [name] and review EGMs. Turned wavelet to monitor. Extended VF detection interval to 200 bpm. Continue amiodarone 400 mg once daily. Continue BB. ROV with Dr. Graciela [name] in 4-6 weeks.  Signed, Rick Duff, PA-C 07/28/2013, 10:01 AM

## 2013-07-28 ENCOUNTER — Ambulatory Visit (INDEPENDENT_AMBULATORY_CARE_PROVIDER_SITE_OTHER): Payer: Medicare Other | Admitting: Cardiology

## 2013-07-28 ENCOUNTER — Encounter: Payer: Self-pay | Admitting: Cardiology

## 2013-07-28 ENCOUNTER — Encounter: Payer: Self-pay | Admitting: Internal Medicine

## 2013-07-28 VITALS — BP 120/78 | HR 80 | Ht 72.0 in | Wt 196.1 lb

## 2013-07-28 DIAGNOSIS — I472 Ventricular tachycardia: Secondary | ICD-10-CM

## 2013-07-28 DIAGNOSIS — I498 Other specified cardiac arrhythmias: Secondary | ICD-10-CM

## 2013-07-28 DIAGNOSIS — I255 Ischemic cardiomyopathy: Secondary | ICD-10-CM

## 2013-07-28 DIAGNOSIS — I471 Supraventricular tachycardia: Secondary | ICD-10-CM

## 2013-07-28 DIAGNOSIS — I2589 Other forms of chronic ischemic heart disease: Secondary | ICD-10-CM

## 2013-07-28 DIAGNOSIS — Z95810 Presence of automatic (implantable) cardiac defibrillator: Secondary | ICD-10-CM

## 2013-07-28 LAB — ICD DEVICE OBSERVATION
AL IMPEDENCE ICD: 456 Ohm
ATRIAL PACING ICD: 97.9 pct
BAMS-0001: 171 {beats}/min
DEV-0020ICD: NEGATIVE
FVT: 0
VENTRICULAR PACING ICD: 1.6 pct
VF: 0

## 2013-07-28 NOTE — Patient Instructions (Addendum)
Your physician recommends that you continue on your current medications as directed. Please refer to the Current Medication list given to you today.  KEEP APPOINTMENT WITH DR. Graciela Husbands IN November

## 2013-08-10 ENCOUNTER — Encounter: Payer: Self-pay | Admitting: Internal Medicine

## 2013-08-14 ENCOUNTER — Encounter: Payer: Self-pay | Admitting: Internal Medicine

## 2013-08-14 ENCOUNTER — Telehealth: Payer: Self-pay | Admitting: *Deleted

## 2013-08-14 NOTE — Telephone Encounter (Signed)
Patient sent e-mail that he is having shortness of breath over the past few weeks. He states that yesterday he was woken up by pain in the middle of his chest, he went to his PCP who sent him to [facility] - they stated that it was not his heart after several tests. Patient states that it seems to have gotten worse over the past few weeks. He has an annual physical on Tuesday that he is going to go to and then call me back with any results of that visit - then I will address this with Dr. Graciela Husbands afterwards. Patient agreeable to plan.

## 2013-08-18 NOTE — Telephone Encounter (Signed)
Follow up      FYI,   Patient family practice will be sending over recent office notes.  Hospital stay.

## 2013-08-21 NOTE — Telephone Encounter (Signed)
Spoke with patient's wife, patient not at home. She states that he is still having SOB but it has improved slightly. I will have Dr.Klein review hospital records next week and will be in touch with patient with recommendations. Patient's wife agreeable to plan.

## 2013-08-23 ENCOUNTER — Encounter: Payer: Self-pay | Admitting: Internal Medicine

## 2013-08-24 NOTE — Telephone Encounter (Signed)
Patient sent email this morning (see below). I returned call and spoke with Trina Ao who tells me patient SOB not getting any better. She states that patient was sitting in chair last night when his device went off. She also informs me that patient cannot walk 91ft without feeling SOB and that they normally play golf every Thursday and he could only play one hole last week. Paitient was recently in hospital with chest pain, determined non-cardiac, but patient's wife states that they never did address the SOB complaint. Patient next OV isn't until 11/20 - I will review this with Dr. Graciela Husbands tomorrow morning too see about earlier office visit needed. Patient's wife verbalized understanding and agreeable to plan.    From: [name]    Sent: 08/23/2013 8:26 PM        today is Sunday 08/23/2013...Marland KitchenMarland KitchenI continue to have difficulty breathing .[name]... around 6:30pm tonight, my device went off .... I am not in pain and continue to rest at home. After you review the recently faxed papers from [facility] I hope to hear about an earlier appointment at you office. The best phone for contact is [phone]....you will be contacting Trina Ao at that number.

## 2013-08-25 NOTE — Telephone Encounter (Signed)
Discussed with Dr. Graciela Husbands, opening appointment for Thursday at 1:15pm for patient to be seen. Patient's wife agreeable to plan.

## 2013-08-27 ENCOUNTER — Ambulatory Visit (INDEPENDENT_AMBULATORY_CARE_PROVIDER_SITE_OTHER): Payer: Medicare Other | Admitting: Internal Medicine

## 2013-08-27 ENCOUNTER — Encounter: Payer: Self-pay | Admitting: Internal Medicine

## 2013-08-27 VITALS — BP 110/70 | HR 78 | Ht 72.0 in | Wt 195.4 lb

## 2013-08-27 DIAGNOSIS — I498 Other specified cardiac arrhythmias: Secondary | ICD-10-CM

## 2013-08-27 DIAGNOSIS — I2589 Other forms of chronic ischemic heart disease: Secondary | ICD-10-CM

## 2013-08-27 DIAGNOSIS — R0602 Shortness of breath: Secondary | ICD-10-CM

## 2013-08-27 DIAGNOSIS — I5022 Chronic systolic (congestive) heart failure: Secondary | ICD-10-CM

## 2013-08-27 DIAGNOSIS — I255 Ischemic cardiomyopathy: Secondary | ICD-10-CM

## 2013-08-27 DIAGNOSIS — I471 Supraventricular tachycardia: Secondary | ICD-10-CM

## 2013-08-27 DIAGNOSIS — Z95810 Presence of automatic (implantable) cardiac defibrillator: Secondary | ICD-10-CM

## 2013-08-27 DIAGNOSIS — I472 Ventricular tachycardia: Secondary | ICD-10-CM

## 2013-08-27 DIAGNOSIS — I2510 Atherosclerotic heart disease of native coronary artery without angina pectoris: Secondary | ICD-10-CM

## 2013-08-27 MED ORDER — RAMIPRIL 2.5 MG PO CAPS: 5.0000 mg | ORAL_CAPSULE | Freq: Every day | ORAL | Status: DC

## 2013-08-27 NOTE — Assessment & Plan Note (Signed)
Recurrent treatment for atrial therapies but in this occasion there may well have been tachycardia-induced tachycardia

## 2013-08-27 NOTE — Assessment & Plan Note (Signed)
The patient has significant exercise intolerance  . We will try to augment afterload  \ As above

## 2013-08-27 NOTE — Assessment & Plan Note (Signed)
As above.

## 2013-08-27 NOTE — Progress Notes (Signed)
Patient Care Team: [name] as PCP - General (Family Medicine)   HPI  [name] is a 72 y.o. male Seen in followup for ventricular tachycardia occurring in the context of ischemic heart disease prior revascularization MI. He has had significant problems with ICD discharge and underwent catheter ablation November 2012. He underwent device revision with insertion of an atrial lead and generator replacement 7/14  He underwent repeat ablation Frank 2014  He was rendered noninducible at this procedure also. He has had recurrent ventricular tachycardia.  Catheterization was recently demonstrated two-vessel coronary disease with patent stents in the chronically totally occluded RCA ejection fraction of 25-30% Myoview scan demonstrated a large inferolateral infarct.  Echocardiogram 8/14 demonstrated EF of 15-20%  He was seen recently at [facility]. He has been having problems with shortness of breath which has been very limiting over the last 2-3 weeks unassociated with edema or nocturnal dyspnea/orthopnea. There has been no chest pain. He did suffer an ICD shock last  Sunday evening.  Past Medical History  Diagnosis Date  . CAD (coronary artery disease)     s/p MI 1982;  s/p DES to CFX 2011;  s/p NSTEMI 4/12 in setting of VTach;  cath 7/12:  LM ok, LAD mid 30-40%, Dx's with 40%, mCFX stent patent, prox to mid RCA occluded with R to R and L to R collats.  Medical Tx was continued  . Bradycardia   . Ventricular tachycardia     s/p AICD;   h/o ICD shock;  Amiodarone Rx. S/P ablation in 2012  . 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 HTN (hypertension)   . HLD (hyperlipidemia)   . Cytopenia   . Hypothyroidism   . Renal insufficiency   . Claudication   . Chronic systolic heart failure   . Ischemic cardiomyopathy     echo 7/12:  EF 25-30%, mild  AI, mod MR, mod LAE  . TIA (transient ischemic attack) 2001  . CVD (cerebrovascular disease)     left CEA 2008  . Stroke   . Anemia   . Inappropriate therapy from implantable cardioverter-defibrillator 04/02/2013    ATP for sinus tach  SVT wavelet identified SVT but was no  passive   . Myocardial infarction 1992  . Anginal pain   . Automatic implantable cardioverter-defibrillator in situ   . Sleep apnea     denies wearing mask (05/25/2013)  . Arthritis     "a little in my knees" (05/25/2013)  . Melanoma of ear 2013    "near left ear" (05/25/2013)    Past Surgical History  Procedure Laterality Date  . Femoral-popliteal bypass graft Right 2011  . Cardiac defibrillator placement  2003; 2005; 05/25/2013    Medtronic "recalled in 2005 and replaced, replaced w/one that paces too"  . Revascularization / in-situ graft leg    . Renal artery bypass Bilateral 2003  . Back surgery    . Lumbar disc surgery  1974; 2000's  . Knee arthroscopy Bilateral 1990's  . Elbow arthroscopy Right 1990's  . Cholecystectomy  12/15/?2010  . Lipoma excision Left 2013    "near ear" (05/25/2013)  . Heel spur surgery Right 1990's  . Cataract extraction w/ intraocular lens  implant, bilateral Bilateral 2000  . Carotid endarterectomy Left ~  2007  . Doppler echocardiography  2011  . Tonsillectomy  1946  . Femoral-popliteal bypass graft Left 05/2002    Hattie Perch 05/06/2002 (05/25/2013)  . Tumor excision Left 1960's    "fatty tumor" (05/25/2013)  . Cardiac electrophysiology study and ablation  2012  . Coronary angioplasty  1992  . Cardiac catheterization      "several" (05/25/2013)  . Coronary angioplasty with stent placement      "last one was 11/2012  (05/25/2013)    Current Outpatient Prescriptions  Medication Sig Dispense Refill  . amiodarone (PACERONE) 200 MG tablet Take 2 tablets (400 mg total) by mouth daily.  30 tablet  4  . aspirin 81 MG tablet Take 81 mg by mouth daily.       [name] atorvastatin (LIPITOR) 80 MG  tablet Take 80 mg by mouth daily.       [name] CALCIUM-MAGNESIUM-ZINC PO Take 3 tablets by mouth at bedtime. 1000 mg calcium, 400 mg magnesium, 25 mg zinc      . carvedilol (COREG) 6.25 MG tablet Take 6.25 mg by mouth 2 (two) times daily with a meal.      . clopidogrel (PLAVIX) 75 MG tablet Take 75 mg by mouth daily.       . fexofenadine (ALLEGRA) 180 MG tablet Take 180 mg by mouth daily as needed (for allergies).      . furosemide (LASIX) 20 MG tablet Take 20 mg by mouth daily.   1 tablet    . levothyroxine (SYNTHROID) 112 MCG tablet Take 1 tablet (112 mcg total) by mouth daily.      . Multiple Vitamin (MULTIVITAMIN) tablet Take 1 tablet by mouth daily.       . Multiple Vitamins-Minerals (EYE VITAMINS PO) Take 1 capsule by mouth 2 (two) times daily.       . niacin (NIASPAN) 750 MG CR tablet Take 1,500 mg by mouth at bedtime.      . nitroGLYCERIN (NITROSTAT) 0.4 MG SL tablet Place 0.4 mg under the tongue every 5 (five) minutes as needed for chest pain. For chest pain      . Omega-3 Fatty Acids (FISH OIL) 1200 MG CAPS Take 1 capsule by mouth daily.      . ramipril (ALTACE) 2.5 MG capsule Take 1 capsule (2.5 mg total) by mouth daily.  30 capsule  4   No current facility-administered medications for this visit.    Allergies  Allergen Reactions  . Meperidine Hcl Other (See Comments)    "CAN'T MOVE" CATATONIC PER PT.  [name] Opium Other (See Comments)    "CAN'T MOVE" CATATONIC PER PT.    Review of Systems negative except from HPI and PMH  Physical Exam BP 110/70  Pulse 78  Ht 6' (1.829 m)  Wt 195 lb 6.4 oz (88.633 kg)  BMI 26.5 kg/m2  SpO2 98% Well developed and well nourished in no acute distress HENT normal E scleral and icterus clear Neck Supple JVP flat; carotids brisk and full Clear to ausculation  Regular rate and rhythm, no murmurs gallops or rub Soft with active bowel sounds No clubbing cyanosis none Edema Alert and oriented, grossly normal motor and sensory function Skin Warm  and Dry capillary refill about 2-1/2 seconds    Assessment and  Plan

## 2013-08-27 NOTE — Assessment & Plan Note (Signed)
The patient has atrial tachycardia on 2 occasions since his new lead was implanted. The most recent one is concurrent with the acute onset of his exercise intolerance/dyspnea. After 2-3 weeks there was a tachycardia associated tachycardia which I cannot exclude ventricular tachycardia which was an accelerated to very rapid ventricular tachycardia via  ATP and for this he was shocked which restored sinus rhythm. He has not felt brachial better.  Treatment options atrial tachycardia include catheter ablation. In addition augmented medical therapy with the addition of a 1 a, probably quinidine 2 mexiletine to amiodarone would be an alternative. I will review the above with Dr. Ladona Ridgel.  In addition, we will repeat the echo as there had been a significant interval deterioration. I would like to go ahead and increase his enalapril to be from 2.5--5 mg. We'll also refer him to the heart failure clinic for consideration of treatment options and advanced therapies

## 2013-08-27 NOTE — Assessment & Plan Note (Signed)
We will stop his Niaspan based on recent data

## 2013-08-27 NOTE — Patient Instructions (Signed)
Please call next week  Your physician has requested that you have an echocardiogram. Echocardiography is a painless test that uses sound waves to create images of your heart. It provides your doctor with information about the size and shape of your heart and how well your heart's chambers and valves are working. This procedure takes approximately one hour. There are no restrictions for this procedure.   You have been referred to Dr. Gala Romney at heart failure clinic  Your physician recommends that you schedule a follow-up appointment in: 8 weeks with Dr. Graciela Husbands.  Your physician recommends that you continue on your current medications as directed. Please refer to the Current Medication list given to you today.

## 2013-08-30 ENCOUNTER — Encounter: Payer: Self-pay | Admitting: Internal Medicine

## 2013-08-31 ENCOUNTER — Other Ambulatory Visit: Payer: Self-pay

## 2013-08-31 MED ORDER — AMIODARONE HCL 200 MG PO TABS: 400.0000 mg | ORAL_TABLET | Freq: Every day | ORAL | Status: DC

## 2013-09-06 ENCOUNTER — Emergency Department (HOSPITAL_COMMUNITY): Payer: Medicare Other

## 2013-09-06 ENCOUNTER — Encounter (HOSPITAL_COMMUNITY): Payer: Self-pay | Admitting: Emergency Medicine

## 2013-09-06 ENCOUNTER — Encounter: Payer: Self-pay | Admitting: Cardiology

## 2013-09-06 ENCOUNTER — Inpatient Hospital Stay (HOSPITAL_COMMUNITY)
Admission: EM | Admit: 2013-09-06 | Discharge: 2013-09-09 | DRG: 309 | Disposition: A | Discharge status: Home / Self Care | Payer: Medicare Other | Attending: Internal Medicine | Admitting: Internal Medicine

## 2013-09-06 DIAGNOSIS — I252 Old myocardial infarction: Secondary | ICD-10-CM

## 2013-09-06 DIAGNOSIS — I509 Heart failure, unspecified: Secondary | ICD-10-CM

## 2013-09-06 DIAGNOSIS — Z9861 Coronary angioplasty status: Secondary | ICD-10-CM

## 2013-09-06 DIAGNOSIS — Z823 Family history of stroke: Secondary | ICD-10-CM

## 2013-09-06 DIAGNOSIS — I2510 Atherosclerotic heart disease of native coronary artery without angina pectoris: Secondary | ICD-10-CM

## 2013-09-06 DIAGNOSIS — E785 Hyperlipidemia, unspecified: Secondary | ICD-10-CM | POA: Diagnosis present

## 2013-09-06 DIAGNOSIS — I10 Essential (primary) hypertension: Secondary | ICD-10-CM

## 2013-09-06 DIAGNOSIS — Z8673 Personal history of transient ischemic attack (TIA), and cerebral infarction without residual deficits: Secondary | ICD-10-CM

## 2013-09-06 DIAGNOSIS — Z95810 Presence of automatic (implantable) cardiac defibrillator: Secondary | ICD-10-CM

## 2013-09-06 DIAGNOSIS — I4729 Other ventricular tachycardia: Principal | ICD-10-CM | POA: Diagnosis present

## 2013-09-06 DIAGNOSIS — Z7982 Long term (current) use of aspirin: Secondary | ICD-10-CM

## 2013-09-06 DIAGNOSIS — Z9889 Other specified postprocedural states: Secondary | ICD-10-CM

## 2013-09-06 DIAGNOSIS — I129 Hypertensive chronic kidney disease with stage 1 through stage 4 chronic kidney disease, or unspecified chronic kidney disease: Secondary | ICD-10-CM | POA: Diagnosis present

## 2013-09-06 DIAGNOSIS — I472 Ventricular tachycardia, unspecified: Principal | ICD-10-CM | POA: Diagnosis present

## 2013-09-06 DIAGNOSIS — N183 Chronic kidney disease, stage 3 unspecified: Secondary | ICD-10-CM | POA: Diagnosis present

## 2013-09-06 DIAGNOSIS — M129 Arthropathy, unspecified: Secondary | ICD-10-CM | POA: Diagnosis present

## 2013-09-06 DIAGNOSIS — I471 Supraventricular tachycardia: Secondary | ICD-10-CM

## 2013-09-06 DIAGNOSIS — Z87891 Personal history of nicotine dependence: Secondary | ICD-10-CM

## 2013-09-06 DIAGNOSIS — G4733 Obstructive sleep apnea (adult) (pediatric): Secondary | ICD-10-CM | POA: Diagnosis present

## 2013-09-06 DIAGNOSIS — I739 Peripheral vascular disease, unspecified: Secondary | ICD-10-CM

## 2013-09-06 DIAGNOSIS — N259 Disorder resulting from impaired renal tubular function, unspecified: Secondary | ICD-10-CM

## 2013-09-06 DIAGNOSIS — E039 Hypothyroidism, unspecified: Secondary | ICD-10-CM | POA: Diagnosis present

## 2013-09-06 DIAGNOSIS — I2589 Other forms of chronic ischemic heart disease: Secondary | ICD-10-CM | POA: Diagnosis present

## 2013-09-06 DIAGNOSIS — Z8249 Family history of ischemic heart disease and other diseases of the circulatory system: Secondary | ICD-10-CM

## 2013-09-06 DIAGNOSIS — I5022 Chronic systolic (congestive) heart failure: Secondary | ICD-10-CM

## 2013-09-06 DIAGNOSIS — R001 Bradycardia, unspecified: Secondary | ICD-10-CM

## 2013-09-06 DIAGNOSIS — I255 Ischemic cardiomyopathy: Secondary | ICD-10-CM

## 2013-09-06 DIAGNOSIS — R55 Syncope and collapse: Secondary | ICD-10-CM

## 2013-09-06 LAB — CBC WITH DIFFERENTIAL/PLATELET
Basophils Relative: 0 % (ref 0–1)
Basophils Relative: 1 % (ref 0–1)
Eosinophils Absolute: 0.1 10*3/uL (ref 0.0–0.7)
Eosinophils Absolute: 0.5 10*3/uL (ref 0.0–0.7)
Eosinophils Relative: 1 % (ref 0–5)
Eosinophils Relative: 8 % — ABNORMAL HIGH (ref 0–5)
Hemoglobin: 11.3 g/dL — ABNORMAL LOW (ref 13.0–17.0)
Lymphocytes Relative: 20 % (ref 12–46)
Lymphs Abs: 1 10*3/uL (ref 0.7–4.0)
Lymphs Abs: 1.2 10*3/uL (ref 0.7–4.0)
MCH: 28.9 pg (ref 26.0–34.0)
MCH: 34 pg (ref 26.0–34.0)
MCHC: 34.5 g/dL (ref 30.0–36.0)
MCV: 88 fL (ref 78.0–100.0)
MCV: 98.4 fL (ref 78.0–100.0)
Monocytes Absolute: 0.8 10*3/uL (ref 0.1–1.0)
Monocytes Relative: 12 % (ref 3–12)
Neutro Abs: 7.4 10*3/uL (ref 1.7–7.7)
Neutrophils Relative %: 77 % (ref 43–77)
Neutrophils Relative %: 58 % (ref 43–77)
Platelets: 291 10*3/uL (ref 150–400)
Platelets: 137 10*3/uL — ABNORMAL LOW (ref 150–400)
RBC: 3.91 MIL/uL — ABNORMAL LOW (ref 4.22–5.81)
WBC: 9.5 10*3/uL (ref 4.0–10.5)

## 2013-09-06 LAB — BASIC METABOLIC PANEL
CO2: 27 mEq/L (ref 19–32)
Calcium: 9.3 mg/dL (ref 8.4–10.5)
Chloride: 104 mEq/L (ref 96–112)
Creatinine, Ser: 1.61 mg/dL — ABNORMAL HIGH (ref 0.50–1.35)
GFR calc Af Amer: 47 mL/min — ABNORMAL LOW (ref >90)
Sodium: 139 mEq/L (ref 135–145)

## 2013-09-06 LAB — PROTIME-INR
INR: 1.19 (ref 0.00–1.49)
Prothrombin Time: 14.8 seconds (ref 11.6–15.2)

## 2013-09-06 LAB — MAGNESIUM: Magnesium: 2.4 mg/dL (ref 1.5–2.5)

## 2013-09-06 MED ORDER — AMIODARONE HCL 200 MG PO TABS
400.0000 mg | ORAL_TABLET | Freq: Every day | ORAL | Status: DC
  Administered 2013-09-07 – 2013-09-09 (×3): 400 mg via ORAL
  Filled 2013-09-06 (×3): qty 2

## 2013-09-06 MED ORDER — ASPIRIN 81 MG PO CHEW
81.0000 mg | CHEWABLE_TABLET | Freq: Every day | ORAL | Status: DC
  Administered 2013-09-07 – 2013-09-09 (×3): 81 mg via ORAL
  Filled 2013-09-06 (×3): qty 1

## 2013-09-06 MED ORDER — SODIUM CHLORIDE 0.9 % IV SOLN: 250.0000 mL | INTRAVENOUS | Status: DC | PRN

## 2013-09-06 MED ORDER — QUINIDINE GLUCONATE ER 324 MG PO TBCR
324.0000 mg | EXTENDED_RELEASE_TABLET | Freq: Two times a day (BID) | ORAL | Status: DC
  Administered 2013-09-06 – 2013-09-09 (×6): 324 mg via ORAL
  Filled 2013-09-06 (×8): qty 1

## 2013-09-06 MED ORDER — ATORVASTATIN CALCIUM 80 MG PO TABS
80.0000 mg | ORAL_TABLET | Freq: Every day | ORAL | Status: DC
  Administered 2013-09-06 – 2013-09-08 (×3): 80 mg via ORAL
  Filled 2013-09-06 (×4): qty 1

## 2013-09-06 MED ORDER — ONDANSETRON HCL 4 MG/2ML IJ SOLN: 4.0000 mg | Freq: Four times a day (QID) | INTRAMUSCULAR | Status: DC | PRN

## 2013-09-06 MED ORDER — FUROSEMIDE 20 MG PO TABS
20.0000 mg | ORAL_TABLET | Freq: Every day | ORAL | Status: DC
  Administered 2013-09-06 – 2013-09-09 (×4): 20 mg via ORAL
  Filled 2013-09-06 (×4): qty 1

## 2013-09-06 MED ORDER — SODIUM CHLORIDE 0.9 % IJ SOLN
3.0000 mL | Freq: Two times a day (BID) | INTRAMUSCULAR | Status: DC
  Administered 2013-09-06 – 2013-09-09 (×6): 3 mL via INTRAVENOUS

## 2013-09-06 MED ORDER — RAMIPRIL 5 MG PO CAPS
5.0000 mg | ORAL_CAPSULE | Freq: Every day | ORAL | Status: DC
  Administered 2013-09-07 – 2013-09-08 (×2): 5 mg via ORAL
  Filled 2013-09-06 (×3): qty 1

## 2013-09-06 MED ORDER — SODIUM CHLORIDE 0.9 % IJ SOLN: 3.0000 mL | INTRAMUSCULAR | Status: DC | PRN

## 2013-09-06 MED ORDER — CLOPIDOGREL BISULFATE 75 MG PO TABS
75.0000 mg | ORAL_TABLET | Freq: Every day | ORAL | Status: DC
  Administered 2013-09-07 – 2013-09-09 (×3): 75 mg via ORAL
  Filled 2013-09-06 (×4): qty 1

## 2013-09-06 MED ORDER — ACETAMINOPHEN 325 MG PO TABS: 650.0000 mg | ORAL_TABLET | ORAL | Status: DC | PRN

## 2013-09-06 MED ORDER — ASPIRIN 81 MG PO TABS: 81.0000 mg | ORAL_TABLET | Freq: Every day | ORAL | Status: DC

## 2013-09-06 MED ORDER — LEVOTHYROXINE SODIUM 112 MCG PO TABS
112.0000 ug | ORAL_TABLET | Freq: Every day | ORAL | Status: DC
  Administered 2013-09-07 – 2013-09-09 (×3): 112 ug via ORAL
  Filled 2013-09-06 (×3): qty 1

## 2013-09-06 MED ORDER — CARVEDILOL 6.25 MG PO TABS
6.2500 mg | ORAL_TABLET | Freq: Two times a day (BID) | ORAL | Status: DC
  Administered 2013-09-06 – 2013-09-08 (×5): 6.25 mg via ORAL
  Filled 2013-09-06 (×8): qty 1

## 2013-09-06 NOTE — H&P (Signed)
CARDIOLOGY Admission NOTE  Patient ID: [name]  MRN: [id number]  DOB: [birth year]  Admit date: 09/06/2013 Date of Consult: 09/06/2013  Primary Physician: [name], MD Primary Electrophysiologist:  Dr. Graciela Husbands  Reason for Consultation: Syncope with VT  History of Present Illness: [name] is a 72 y.o. male with an ischemic CM (EF 15-20%)s/p ICD implant, chronic systolic HF, paroxysmal VT, CAD and HTN who presents today after syncopal episode.  He was in his kitchen preparing lunch when he had the sudden onset of lightheadedness, then syncope.  No chest pain or palpitations.  Did not hit his head.  He lost consciousness only briefly.  He presented to the ED, and interrogation of his Medronic ICD showed a 9 second run of NSVT, rate of 180.  No therapy attempted. His device is set with VT zone of 120bpm for 48 beats with 2 ATP attempts, and VF zone of 200bpm with shock as initial therapy.    He has been in his usual state of health over past several weeks, no change in DOE, SOB, no CP, Fever, N/V, Appetite.  He is on coreg and amiodarone.  He had a similar episode of syncope with device interogation felt to be 1:1 atrial tachycardia on 07/13/13 at a rate of 160 bpm.    Seen by Dr. Graciela Husbands in clinic 10/23 where ICD shock noted from 2 weeks prior.   Past Medical History  Diagnosis Date  . CAD (coronary artery disease)     s/p MI 1982;  s/p DES to CFX 2011;  s/p NSTEMI 4/12 in setting of VTach;  cath 7/12:  LM ok, LAD mid 30-40%, Dx's with 40%, mCFX stent patent, prox to mid RCA occluded with R to R and L to R collats.  Medical Tx was continued  . Bradycardia   . Ventricular tachycardia     s/p AICD;   h/o ICD shock;  Amiodarone Rx. S/P ablation in 2012  . 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 HTN (hypertension)   . HLD (hyperlipidemia)   .  Cytopenia   . Hypothyroidism   . Renal insufficiency   . Claudication   . Chronic systolic heart failure   . Ischemic cardiomyopathy     echo 7/12:  EF 25-30%, mild AI, mod MR, mod LAE  . TIA (transient ischemic attack) 2001  . CVD (cerebrovascular disease)     left CEA 2008  . Stroke   . Anemia   . Inappropriate therapy from implantable cardioverter-defibrillator 04/02/2013    ATP for sinus tach  SVT wavelet identified SVT but was no  passive   . Myocardial infarction 1992  . Anginal pain   . Automatic implantable cardioverter-defibrillator in situ   . Sleep apnea     denies wearing mask (05/25/2013)  . Arthritis     "a little in my knees" (05/25/2013)  . Melanoma of ear 2013    "near left ear" (05/25/2013)    Past Surgical History  Procedure Laterality Date  . Femoral-popliteal bypass graft Right 2011  . Cardiac defibrillator placement  2003; 2005; 05/25/2013    Medtronic "recalled in 2005 and replaced, replaced w/one that paces too"  . Revascularization / in-situ graft leg    . Renal artery bypass Bilateral 2003  . Back surgery    .  Lumbar disc surgery  1974; 2000's  . Knee arthroscopy Bilateral 1990's  . Elbow arthroscopy Right 1990's  . Cholecystectomy  12/15/?2010  . Lipoma excision Left 2013    "near ear" (05/25/2013)  . Heel spur surgery Right 1990's  . Cataract extraction w/ intraocular lens  implant, bilateral Bilateral 2000  . Carotid endarterectomy Left ~ 2007  . Doppler echocardiography  2011  . Tonsillectomy  1946  . Femoral-popliteal bypass graft Left 05/2002    Hattie Perch 05/06/2002 (05/25/2013)  . Tumor excision Left 1960's    "fatty tumor" (05/25/2013)  . Cardiac electrophysiology study and ablation  2012  . Coronary angioplasty  1992  . Cardiac catheterization      "several" (05/25/2013)  . Coronary angioplasty with stent placement      "last one was 11/2012  (05/25/2013)     Home Meds: Prior to Admission medications   Medication Sig Start Date End Date  Taking? Authorizing Provider  amiodarone (PACERONE) 200 MG tablet Take 2 tablets (400 mg total) by mouth daily. 08/31/13  Yes Duke Salvia, MD  aspirin 81 MG tablet Take 81 mg by mouth daily.    Yes Historical Provider, MD  atorvastatin (LIPITOR) 80 MG tablet Take 80 mg by mouth daily.  02/05/11  Yes Historical Provider, MD  CALCIUM-MAGNESIUM-ZINC PO Take 3 tablets by mouth at bedtime. 1000 mg calcium, 400 mg magnesium, 25 mg zinc   Yes Historical Provider, MD  carvedilol (COREG) 6.25 MG tablet Take 6.25 mg by mouth 2 (two) times daily with a meal. 06/09/13  Yes Brooke O Edmisten, PA-C  clopidogrel (PLAVIX) 75 MG tablet Take 75 mg by mouth daily.    Yes Historical Provider, MD  fexofenadine (ALLEGRA) 180 MG tablet Take 180 mg by mouth daily as needed (for allergies).   Yes Historical Provider, MD  furosemide (LASIX) 20 MG tablet Take 20 mg by mouth daily.  05/12/13  Yes Duke Salvia, MD  levothyroxine (SYNTHROID) 112 MCG tablet Take 1 tablet (112 mcg total) by mouth daily. 05/25/11  Yes Peter M [place], MD  Multiple Vitamin (MULTIVITAMIN) tablet Take 1 tablet by mouth daily.    Yes Historical Provider, MD  Multiple Vitamins-Minerals (EYE VITAMINS PO) Take 1 capsule by mouth 2 (two) times daily.    Yes Historical Provider, MD  Omega-3 Fatty Acids (FISH OIL) 1200 MG CAPS Take 1 capsule by mouth daily.   Yes Historical Provider, MD  ramipril (ALTACE) 2.5 MG capsule Take 2 capsules (5 mg total) by mouth daily. 08/27/13  Yes Duke Salvia, MD  nitroGLYCERIN (NITROSTAT) 0.4 MG SL tablet Place 0.4 mg under the tongue every 5 (five) minutes as needed for chest pain. For chest pain    Historical Provider, MD    Current Medications:    Allergies:    Allergies  Allergen Reactions  . Meperidine Hcl Other (See Comments)    "CAN'T MOVE" CATATONIC PER PT.  [name] Opium Other (See Comments)    "CAN'T MOVE" CATATONIC PER PT.    Social History:   The patient  reports that he quit smoking about 11 years ago. His  smoking use included Cigarettes. He has a 18.5 pack-year smoking history. He has never used smokeless tobacco. He reports that he drinks alcohol. He reports that he does not use illicit drugs.    Family History:   The patient's family history includes Coronary artery disease in his father, mother, and another family member; Heart attack in his mother; Stroke in his father.  ROS:   Review of Systems  Constitutional: Negative for fever, chills and weight loss.  HENT: Negative for hearing loss and tinnitus.   Eyes: Negative for blurred vision and double vision.  Respiratory: Negative for cough and hemoptysis.   Cardiovascular: Negative for chest pain and palpitations.  Gastrointestinal: Negative for heartburn, nausea, vomiting and abdominal pain.  Genitourinary: Negative for dysuria, urgency and frequency.  Musculoskeletal: Negative for back pain, myalgias and neck pain.  Skin: Negative for itching and rash.  Neurological: Positive for dizziness and loss of consciousness. Negative for headaches.  Endo/Heme/Allergies: Negative for environmental allergies. Does not bruise/bleed easily.  Psychiatric/Behavioral: Negative for depression, suicidal ideas, hallucinations and substance abuse.     Vital Signs: Blood pressure 109/71, pulse 70, temperature 98.2 F (36.8 C), temperature source Oral, resp. rate 14, SpO2 99.00%.   PHYSICAL EXAM: General:  Well nourished, well developed, in no acute distress HEENT: normal Lymph: no adenopathy Neck: no JVD Endocrine:  No thryomegaly Vascular: No carotid bruits; FA pulses 2+ bilaterally without bruits Cardiac:  normal S1, S2; RRR; no murmur Lungs:  clear to auscultation bilaterally, no wheezing, rhonchi or rales Abd: soft, nontender, no hepatomegaly Ext: no edema Musculoskeletal:  No deformities, BUE and BLE strength normal and equal Skin: warm and dry Neuro:  CNs 2-12 intact, no focal abnormalities noted Psych:  Normal affect   EKG:  AV paced    Device Interrogation:  NSVT, duration 9 seconds, rate 180bpm.  Self terminated.  Slow atrial rate so clearly VT.  His device is set with VT zone of 120bpm for 48 beats with 2 ATP attempts, and VF zone of 200bpm with shock as initial therapy.     Labs: No results found for this basename: CKTOTAL, CKMB, TROPONINI,  in the last 72 hours Lab Results  Component Value Date   WBC 9.5 09/06/2013   HGB 11.3* 09/06/2013   HCT 34.4* 09/06/2013   MCV 88.0 09/06/2013   PLT 291 09/06/2013    Recent Labs Lab 09/06/13 1453  NA 139  K 4.8  CL 104  CO2 27  BUN 29*  CREATININE 1.61*  CALCIUM 9.3  GLUCOSE 84   Lab Results  Component Value Date   CHOL 118 11/28/2012   HDL 50.80 11/28/2012   LDLCALC 58 11/28/2012   TRIG 48.0 11/28/2012   No results found for this basename: DDIMER    Radiology/Studies:  Dg Chest Port 1 View  09/06/2013   CLINICAL DATA:  Syncope. Shortness of breath. Emphysema.  EXAM: PORTABLE CHEST - 1 VIEW  COMPARISON:  07/13/2013  FINDINGS: Heart size remains at the upper limits of normal. Dual lead transvenous pacemaker remains in appropriate position. Both lungs are clear. No evidence of pleural effusion. No mass or lymphadenopathy identified.  IMPRESSION: Stable borderline cardiomegaly. No active lung disease.   Electronically Signed   By: Myles Rosenthal M.D.   On: 09/06/2013 14:53    ASSESSMENT AND PLAN:  1. Syncope with 9 seconds of self terminating VT (180bpm) on ICD - Discussed case with Dr. Graciela Husbands.  Plan will be to admit patient and start Quinidine 324 BID in addition to amiodarone, first dose tonight.  Continue coreg. - Echo in the morning. - Daily ECG and CBC w Diff.  2.  CHF, EF 15% - Euvolemic on exam - Echo ordered for the morning - T/C Advanced Heart Failure consult while inpatient. - Continue low dose ramipril, coreg.  T/C spironolactone. - Could consider CRT-D upgrade if v-pacing burden is high  3. Hypothyroidism - continue synthroid.  Check TSH.  4. CAD -  continue ASA/Plavix  5. Stage III CKD - Stable   Signed, SIVAK, JOSEPH 09/06/2013 5:53 PM

## 2013-09-06 NOTE — ED Notes (Signed)
Dr. James at bedside  

## 2013-09-06 NOTE — ED Notes (Signed)
Pt states he has been passing out for the last two months. Pt states he has passed out twice in the past two weeks and his pacemaker/defib fires. Pt has been to his cardiologist and is scheduled for a echo on Wednesday. Pt states he has been getting dizzy, weak, and lightheaded, he had some tingling in his arms and hands along with some sob.

## 2013-09-06 NOTE — ED Provider Notes (Signed)
CSN: [id number]     Arrival date & time 09/06/13  1325 History   First MD Initiated Contact with Patient 09/06/13 1334     Chief Complaint  Patient presents with  . Pacemaker Problem   t) HPI  Patient presents today after an episode of syncope. Has a long cardiac history. Follows with [facility].  Dr. Graciela Husbands is his EP.  Original MI was in 1990 treated with PTCA at [facility].. 2003 he apparently  had another event. Also had surgery the same year for renal artery stenosis and refractory hypertension and developed ischemic myopathy. Underwent AICD placement. Most recent revision of generator and leads was July of this year. He has had 2 cardiac ablation for atrial and ventricular tachycardias. This is third episode of AICD rescue in the last 3 months. He has had his normal level of exertional capacity over the last few days. No episodes of pain or dyspnea that is unusual for him. No feeling of palpitations. Has been eating and hydrating well. Normal bowel and bladder function. Was simply standing in his kitchen counter today. He recalls feeling lightheaded. He lives with an adult male who was not with him today. He was home alone  He awakened on the kitchen floor. He was not aware of an AICD shock. He has been aware of them in the past. . He does not know his length of his loss of consciousness today. He walked to the couch and layed down.  He does not have a headache. He does not have any pain anywhere. No apparent injury with the episode. No change in medications continues on Pacerone 400mg /day.  In review of his chart his episodes have been attributed to atrial tachycardia with one-to-one conduction. At one point when he was paced he continued with higher rate 1-1 conduction. His pacer rescue component of his AICD was apparently turned off in favor of cardioversion because of the increased conduction with pacing.  Past Medical History  Diagnosis Date  . CAD (coronary artery disease)      s/p MI 1982;  s/p DES to CFX 2011;  s/p NSTEMI 4/12 in setting of VTach;  cath 7/12:  LM ok, LAD mid 30-40%, Dx's with 40%, mCFX stent patent, prox to mid RCA occluded with R to R and L to R collats.  Medical Tx was continued  . Bradycardia   . Ventricular tachycardia     s/p AICD;   h/o ICD shock;  Amiodarone Rx. S/P ablation in 2012  . 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 HTN (hypertension)   . HLD (hyperlipidemia)   . Cytopenia   . Hypothyroidism   . Renal insufficiency   . Claudication   . Chronic systolic heart failure   . Ischemic cardiomyopathy     echo 7/12:  EF 25-30%, mild AI, mod MR, mod LAE  . TIA (transient ischemic attack) 2001  . CVD (cerebrovascular disease)     left CEA 2008  . Stroke   . Anemia   . Inappropriate therapy from implantable cardioverter-defibrillator 04/02/2013    ATP for sinus tach  SVT wavelet identified SVT but was no  passive   . Myocardial infarction 1992  . Anginal pain   . Automatic implantable cardioverter-defibrillator in situ   . Sleep apnea  denies wearing mask (05/25/2013)  . Arthritis     "a little in my knees" (05/25/2013)  . Melanoma of ear 2013    "near left ear" (05/25/2013)   Past Surgical History  Procedure Laterality Date  . Femoral-popliteal bypass graft Right 2011  . Cardiac defibrillator placement  2003; 2005; 05/25/2013    Medtronic "recalled in 2005 and replaced, replaced w/one that paces too"  . Revascularization / in-situ graft leg    . Renal artery bypass Bilateral 2003  . Back surgery    . Lumbar disc surgery  1974; 2000's  . Knee arthroscopy Bilateral 1990's  . Elbow arthroscopy Right 1990's  . Cholecystectomy  12/15/?2010  . Lipoma excision Left 2013    "near ear" (05/25/2013)  . Heel spur surgery Right 1990's  . Cataract extraction w/ intraocular lens  implant, bilateral  Bilateral 2000  . Carotid endarterectomy Left ~ 2007  . Doppler echocardiography  2011  . Tonsillectomy  1946  . Femoral-popliteal bypass graft Left 05/2002    Hattie Perch 05/06/2002 (05/25/2013)  . Tumor excision Left 1960's    "fatty tumor" (05/25/2013)  . Cardiac electrophysiology study and ablation  2012  . Coronary angioplasty  1992  . Cardiac catheterization      "several" (05/25/2013)  . Coronary angioplasty with stent placement      "last one was 11/2012  (05/25/2013)   Family History  Problem Relation Age of Onset  . Coronary artery disease    . Heart attack Mother   . Coronary artery disease Mother   . Coronary artery disease Father   . Stroke Father    History  Substance Use Topics  . Smoking status: Former Smoker -- 0.50 packs/day for 37 years    Types: Cigarettes    Quit date: 11/05/2001  . Smokeless tobacco: Never Used  . Alcohol Use: 0.0 oz/week     Comment: 05/25/2013 "mixed drink couple times/month"    Review of Systems  Constitutional: Negative for fever, chills, diaphoresis, appetite change and fatigue.  HENT: Negative for mouth sores, sore throat and trouble swallowing.   Eyes: Negative for visual disturbance.  Respiratory: Negative for cough, chest tightness, shortness of breath and wheezing.   Cardiovascular: Negative for chest pain.  Gastrointestinal: Negative for nausea, vomiting, abdominal pain, diarrhea and abdominal distention.  Endocrine: Negative for polydipsia, polyphagia and polyuria.  Genitourinary: Negative for dysuria, frequency and hematuria.  Musculoskeletal: Negative for gait problem.  Skin: Negative for color change, pallor and rash.  Neurological: Positive for dizziness and syncope. Negative for light-headedness and headaches.  Hematological: Does not bruise/bleed easily.  Psychiatric/Behavioral: Negative for behavioral problems and confusion.    Allergies  Meperidine hcl and Opium  Home Medications   Current Outpatient Rx  Name  Route   Sig  Dispense  Refill  . amiodarone (PACERONE) 200 MG tablet   Oral   Take 2 tablets (400 mg total) by mouth daily.   30 tablet   4   . aspirin 81 MG tablet   Oral   Take 81 mg by mouth daily.          [name] atorvastatin (LIPITOR) 80 MG tablet   Oral   Take 80 mg by mouth daily.          [name] CALCIUM-MAGNESIUM-ZINC PO   Oral   Take 3 tablets by mouth at bedtime. 1000 mg calcium, 400 mg magnesium, 25 mg zinc         . carvedilol (COREG) 6.25 MG tablet  Oral   Take 6.25 mg by mouth 2 (two) times daily with a meal.         . clopidogrel (PLAVIX) 75 MG tablet   Oral   Take 75 mg by mouth daily.          . fexofenadine (ALLEGRA) 180 MG tablet   Oral   Take 180 mg by mouth daily as needed (for allergies).         . furosemide (LASIX) 20 MG tablet   Oral   Take 20 mg by mouth daily.    1 tablet      . levothyroxine (SYNTHROID) 112 MCG tablet   Oral   Take 1 tablet (112 mcg total) by mouth daily.         . Multiple Vitamin (MULTIVITAMIN) tablet   Oral   Take 1 tablet by mouth daily.          . Multiple Vitamins-Minerals (EYE VITAMINS PO)   Oral   Take 1 capsule by mouth 2 (two) times daily.          . Omega-3 Fatty Acids (FISH OIL) 1200 MG CAPS   Oral   Take 1 capsule by mouth daily.         . ramipril (ALTACE) 2.5 MG capsule   Oral   Take 2 capsules (5 mg total) by mouth daily.   30 capsule   3   . nitroGLYCERIN (NITROSTAT) 0.4 MG SL tablet   Sublingual   Place 0.4 mg under the tongue every 5 (five) minutes as needed for chest pain. For chest pain          BP 114/69  Pulse 69  Temp(Src) 98.2 F (36.8 C) (Oral)  Resp 15  SpO2 97% Physical Exam  Constitutional: He is oriented to person, place, and time. He appears well-developed and well-nourished. No distress.  HENT:  Head: Normocephalic.  Eyes: Conjunctivae are normal. Pupils are equal, round, and reactive to light. No scleral icterus.  Neck: Normal range of motion. Neck supple. No  thyromegaly present.  Cardiovascular: Normal rate and regular rhythm.  Exam reveals no gallop and no friction rub.   No murmur heard. Regular rhythm on the monitor with a rate of 70 and visible P waves. On his EKG was obtained he is in a paced rhythm with a ventricular rate of 80.  Pulmonary/Chest: Effort normal and breath sounds normal. No respiratory distress. He has no wheezes. He has no rales.  No herbal edema or crackles or signs of CHF clinically  Abdominal: Soft. Bowel sounds are normal. He exhibits no distension. There is no tenderness. There is no rebound.  Musculoskeletal: Normal range of motion.  Neurological: He is alert and oriented to person, place, and time.  Skin: Skin is warm and dry. No rash noted.  Psychiatric: He has a normal mood and affect. His behavior is normal.    ED Course  Procedures (including critical care time) Labs Review Labs Reviewed  CBC WITH DIFFERENTIAL - Abnormal; Notable for the following:    RBC 3.91 (*)    Hemoglobin 11.3 (*)    HCT 34.4 (*)    Lymphocytes Relative 10 (*)    Monocytes Absolute 1.1 (*)    All other components within normal limits  BASIC METABOLIC PANEL - Abnormal; Notable for the following:    BUN 29 (*)    Creatinine, Ser 1.61 (*)    GFR calc non Af [name] 41 (*)    GFR calc Af  [name] 4 (*)    All other components within normal limits  PROTIME-INR  MAGNESIUM   Imaging Review Dg Chest Port 1 View  09/06/2013   CLINICAL DATA:  Syncope. Shortness of breath. Emphysema.  EXAM: PORTABLE CHEST - 1 VIEW  COMPARISON:  07/13/2013  FINDINGS: Heart size remains at the upper limits of normal. Dual lead transvenous pacemaker remains in appropriate position. Both lungs are clear. No evidence of pleural effusion. No mass or lymphadenopathy identified.  IMPRESSION: Stable borderline cardiomegaly. No active lung disease.   Electronically Signed   By: Myles Rosenthal M.D.   On: 09/06/2013 14:53    EKG Interpretation     Ventricular Rate:  80 PR  Interval:  188 QRS Duration: 186 QT Interval:  496 QTC Calculation: 572 R Axis:   -52 Text Interpretation:  AV dual-paced rhythm Abnormal ECG            MDM   1. Ventricular tachycardia       Unprovoked syncope with AICD. Plan will interrogate his pacemaker to see what his rhythm was inin to determine the appropriateness of his shock (if indeed he was shocked).  I'll speak with his cardiologist following the above.  I did receive a call from May data with report of his pacer interrogation. He's had no episodes from 1023 until today of delivered therapies. Today he had an episode of nonsustained VT. It occurred at 1:14 PM his atrial rate was 65. His ventricular rate was 179 and lasted for 9 seconds. His VT "zone" his weight 120 beats per minute and will not deliver a shock unless it sustained for 48 beats.  Cardiologist for our cardiology is here evaluating the patient.     Roney Marion, MD 09/06/13 [phone]

## 2013-09-06 NOTE — ED Notes (Signed)
Passed out in kitchen today and it was defib firing states this is 2nd time in a week

## 2013-09-06 NOTE — ED Notes (Signed)
Cardiology at bedside.

## 2013-09-07 DIAGNOSIS — I472 Ventricular tachycardia: Principal | ICD-10-CM

## 2013-09-07 DIAGNOSIS — I359 Nonrheumatic aortic valve disorder, unspecified: Secondary | ICD-10-CM

## 2013-09-07 LAB — BASIC METABOLIC PANEL
BUN: 25 mg/dL — ABNORMAL HIGH (ref 6–23)
Calcium: 9.1 mg/dL (ref 8.4–10.5)
Creatinine, Ser: 1.61 mg/dL — ABNORMAL HIGH (ref 0.50–1.35)
GFR calc non Af Amer: 41 mL/min — ABNORMAL LOW (ref >90)
Glucose, Bld: 108 mg/dL — ABNORMAL HIGH (ref 70–99)

## 2013-09-07 LAB — TSH: TSH: 6.323 u[IU]/mL — ABNORMAL HIGH (ref 0.350–4.500)

## 2013-09-07 NOTE — Progress Notes (Signed)
Patient Name: [name]      SUBJECTIVE  Feeling better  With base line shortnes of breath  Past Medical History  Diagnosis Date  . CAD (coronary artery disease)     s/p MI 1982;  s/p DES to CFX 2011;  s/p NSTEMI 4/12 in setting of VTach;  cath 7/12:  LM ok, LAD mid 30-40%, Dx's with 40%, mCFX stent patent, prox to mid RCA occluded with R to R and L to R collats.  Medical Tx was continued  . Bradycardia   . Ventricular tachycardia     s/p AICD;   h/o ICD shock;  Amiodarone Rx. S/P ablation in 2012  . 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 HTN (hypertension)   . HLD (hyperlipidemia)   . Cytopenia   . Hypothyroidism   . Renal insufficiency   . Claudication   . Chronic systolic heart failure   . Ischemic cardiomyopathy     echo 7/12:  EF 25-30%, mild AI, mod MR, mod LAE  . TIA (transient ischemic attack) 2001  . CVD (cerebrovascular disease)     left CEA 2008  . Stroke   . Anemia   . Inappropriate therapy from implantable cardioverter-defibrillator 04/02/2013    ATP for sinus tach  SVT wavelet identified SVT but was no  passive   . Myocardial infarction 1992  . Anginal pain   . Automatic implantable cardioverter-defibrillator in situ   . Sleep apnea     denies wearing mask (05/25/2013)  . Arthritis     "a little in my knees" (05/25/2013)  . Melanoma of ear 2013    "near left ear" (05/25/2013)    Scheduled Meds:  Scheduled Meds: . amiodarone  400 mg Oral Daily  . aspirin  81 mg Oral Daily  . atorvastatin  80 mg Oral q1800  . carvedilol  6.25 mg Oral BID WC  . clopidogrel  75 mg Oral Daily  . furosemide  20 mg Oral Daily  . levothyroxine  112 mcg Oral Daily  . quiniDINE gluconate  324 mg Oral Q12H  . ramipril  5 mg Oral Daily  . sodium chloride  3 mL Intravenous Q12H   Continuous Infusions:   PHYSICAL EXAM Filed  Vitals:   09/06/13 1930 09/06/13 2027 09/07/13 0133 09/07/13 0623  BP: 106/61 125/74 98/56 103/68  Pulse: 72 71 84 74  Temp:  97.9 F (36.6 C) 97.9 F (36.6 C) 97.7 F (36.5 C)  TempSrc:  Oral Oral Oral  Resp: 15 16 15 16   Height:  6' (1.829 m)    Weight:  191 lb 9.3 oz (86.9 kg)  189 lb 6 oz (85.9 kg)  SpO2: 96% 98% 98% 100%    Well developed and nourished in no acute distress HENT normal Neck supple with 10 Clear Regular rate and rhythm, no murmurs or gallops Abd-soft with active BS No Clubbing cyanosis edema Skin-warm and dry A & Oriented  Grossly normal sensory and motor function  TELEMETRY: Reviewed telemetry pt in AV pacing   Intake/Output Summary (Last 24 hours) at 09/07/13 0757 Last data filed at 09/07/13 0624  Gross per 24 hour  Intake    200 ml  Output   1150 ml  Net   -950 ml  LABS: Basic Metabolic Panel:  Recent Labs Lab 09/06/13 1453 09/07/13 0610  NA 139 140  K 4.8 4.3  CL 104 105  CO2 27 28  GLUCOSE 84 108*  BUN 29* 25*  CREATININE 1.61* 1.61*  CALCIUM 9.3 9.1  MG 2.4  --    Cardiac Enzymes: No results found for this basename: CKTOTAL, CKMB, CKMBINDEX, TROPONINI,  in the last 72 hours CBC:  Recent Labs Lab 09/06/13 1453 09/06/13 1907  WBC 9.5 5.8  NEUTROABS 7.4 3.4  HGB 11.3* 12.5*  HCT 34.4* 36.2*  MCV 88.0 98.4  PLT 291 137*   PROTIME:  Recent Labs  09/06/13 1453  LABPROT 14.8  INR 1.19   Liver Function Tests: No results found for this basename: AST, ALT, ALKPHOS, BILITOT, PROT, ALBUMIN,  in the last 72 hours No results found for this basename: LIPASE, AMYLASE,  in the last 72 hours BNP: BNP (last 3 results)  Recent Labs  11/23/12 0901  PROBNP 1891.0*   D-Dimer: No results found for this basename: DDIMER,  in the last 72 hours Hemoglobin A1C: No results found for this basename: HGBA1C,  in the last 72 hours Fasting Lipid Panel: No results found for this basename: CHOL, HDL, LDLCALC, TRIG, CHOLHDL,  LDLDIRECT,  in the last 72 hours Thyroid Function Tests:  Recent Labs  09/06/13 1907  TSH 6.323*   Anemia Panel: No results found for this basename: VITAMINB12, FOLATE, FERRITIN, TIBC, IRON, RETICCTPCT,  in the last 72 hours   Device Interrogation:* recurrent VT NS (9Sec) resulted in syncope    ASSESSMENT AND PLAN:  Active Problems:   VENTRICULAR TACHYCARDIA    implantable cardiac defibrillator  MEDTRONIC   Ischemic cardiomyopathy   Chronic systolic heart failure   We have been worried about proarrhythmia from ATP so shorteniing of NID is treacherous  I would favor one of two approaches, in concert.  Add quinidine which is the safest adjuntive drug probably for LV dysfunction and CHF consultation for possible LVAD support    Signed, Sherryl Manges MD  09/07/2013

## 2013-09-07 NOTE — Progress Notes (Signed)
  Echocardiogram 2D Echocardiogram has been performed.  Cathie Beams 09/07/2013, 9:58 AM

## 2013-09-07 NOTE — Progress Notes (Signed)
Utilization Review Completed.[name] T11/01/2013  

## 2013-09-08 LAB — BASIC METABOLIC PANEL
BUN: 25 mg/dL — ABNORMAL HIGH (ref 6–23)
CO2: 28 mEq/L (ref 19–32)
Calcium: 9.3 mg/dL (ref 8.4–10.5)
Creatinine, Ser: 1.69 mg/dL — ABNORMAL HIGH (ref 0.50–1.35)
GFR calc non Af Amer: 38 mL/min — ABNORMAL LOW (ref >90)
Glucose, Bld: 99 mg/dL (ref 70–99)
Sodium: 139 mEq/L (ref 135–145)

## 2013-09-08 NOTE — Progress Notes (Signed)
Patient Name: [name]      SUBJECTIVE  Feeling better  With base line shortness of breath  No palpitations Past Medical History  Diagnosis Date  . CAD (coronary artery disease)     s/p MI 1982;  s/p DES to CFX 2011;  s/p NSTEMI 4/12 in setting of VTach;  cath 7/12:  LM ok, LAD mid 30-40%, Dx's with 40%, mCFX stent patent, prox to mid RCA occluded with R to R and L to R collats.  Medical Tx was continued  . Bradycardia   . Ventricular tachycardia     s/p AICD;   h/o ICD shock;  Amiodarone Rx. S/P ablation in 2012  . 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 HTN (hypertension)   . HLD (hyperlipidemia)   . Cytopenia   . Hypothyroidism   . Renal insufficiency   . Claudication   . Chronic systolic heart failure   . Ischemic cardiomyopathy     echo 7/12:  EF 25-30%, mild AI, mod MR, mod LAE  . TIA (transient ischemic attack) 2001  . CVD (cerebrovascular disease)     left CEA 2008  . Stroke   . Anemia   . Inappropriate therapy from implantable cardioverter-defibrillator 04/02/2013    ATP for sinus tach  SVT wavelet identified SVT but was no  passive   . Myocardial infarction 1992  . Anginal pain   . Automatic implantable cardioverter-defibrillator in situ   . Sleep apnea     denies wearing mask (05/25/2013)  . Arthritis     "a little in my knees" (05/25/2013)  . Melanoma of ear 2013    "near left ear" (05/25/2013)    Scheduled Meds:  Scheduled Meds: . amiodarone  400 mg Oral Daily  . aspirin  81 mg Oral Daily  . atorvastatin  80 mg Oral q1800  . carvedilol  6.25 mg Oral BID WC  . clopidogrel  75 mg Oral Daily  . furosemide  20 mg Oral Daily  . levothyroxine  112 mcg Oral Daily  . quiniDINE gluconate  324 mg Oral Q12H  . ramipril  5 mg Oral Daily  . sodium chloride  3 mL Intravenous Q12H   Continuous Infusions:   PHYSICAL  EXAM Filed Vitals:   09/07/13 0816 09/07/13 1307 09/07/13 2033 09/08/13 0549  BP: 124/69 99/56 111/59 90/52  Pulse: 70 72 70 70  Temp:  97.9 F (36.6 C) 98.4 F (36.9 C) 98.2 F (36.8 C)  TempSrc:  Oral Oral Oral  Resp: 16 16 16 17   Height:      Weight:    188 lb 8 oz (85.503 kg)  SpO2: 100% 99% 96% 100%    Well developed and nourished in no acute distress HENT normal Neck supple with 10 Clear Regular rate and rhythm, no murmurs or gallops Abd-soft with active BS No Clubbing cyanosis edema Skin-warm and dry A & Oriented  Grossly normal sensory and motor function  TELEMETRY: Reviewed telemetry pt in AV pacing   Intake/Output Summary (Last 24 hours) at 09/08/13 0749 Last data filed at 09/08/13 0724  Gross per 24 hour  Intake   1040 ml  Output   2060 ml  Net  -1020 ml    LABS: Basic Metabolic Panel:  Recent  Labs Lab 09/06/13 1453 09/07/13 0610 09/08/13 0500  NA 139 140 139  K 4.8 4.3 5.0  CL 104 105 104  CO2 27 28 28   GLUCOSE 84 108* 99  BUN 29* 25* 25*  CREATININE 1.61* 1.61* 1.69*  CALCIUM 9.3 9.1 9.3  MG 2.4  --   --    Cardiac Enzymes: No results found for this basename: CKTOTAL, CKMB, CKMBINDEX, TROPONINI,  in the last 72 hours CBC:  Recent Labs Lab 09/06/13 1453 09/06/13 1907  WBC 9.5 5.8  NEUTROABS 7.4 3.4  HGB 11.3* 12.5*  HCT 34.4* 36.2*  MCV 88.0 98.4  PLT 291 137*   PROTIME:  Recent Labs  09/06/13 1453  LABPROT 14.8  INR 1.19   Liver Function Tests: No results found for this basename: AST, ALT, ALKPHOS, BILITOT, PROT, ALBUMIN,  in the last 72 hours No results found for this basename: LIPASE, AMYLASE,  in the last 72 hours BNP: BNP (last 3 results)  Recent Labs  11/23/12 0901  PROBNP 1891.0*   D-Dimer: No results found for this basename: DDIMER,  in the last 72 hours Hemoglobin A1C: No results found for this basename: HGBA1C,  in the last 72 hours Fasting Lipid Panel: No results found for this basename: CHOL, HDL,  LDLCALC, TRIG, CHOLHDL, LDLDIRECT,  in the last 72 hours Thyroid Function Tests:  Recent Labs  09/06/13 1907  TSH 6.323*   Anemia Panel: No results found for this basename: VITAMINB12, FOLATE, FERRITIN, TIBC, IRON, RETICCTPCT,  in the last 72 hours   Device Interrogation:* recurrent VT NS (9Sec) resulted in syncope    ASSESSMENT AND PLAN:  Active Problems:   VENTRICULAR TACHYCARDIA    implantable cardiac defibrillator  MEDTRONIC   Ischemic cardiomyopathy   Chronic systolic heart failure   We have been worried about proarrhythmia from ATP so shorteniing of NID is treacherous  I would favor one of two approaches, in concert.  Add quinidine which is the safest adjuntive drug probably for LV dysfunction and CHF consultation for possible LVAD support  Discussed wtjh DB  Will do CPX on 11/18 and needs CHF clinic consult which will need to be scheduled Anticipating discharge inam    Signed, Sherryl Manges MD  09/08/2013

## 2013-09-09 ENCOUNTER — Ambulatory Visit (HOSPITAL_COMMUNITY): Payer: Medicare Other

## 2013-09-09 ENCOUNTER — Encounter (HOSPITAL_COMMUNITY): Payer: Medicare Other

## 2013-09-09 LAB — BASIC METABOLIC PANEL
BUN: 27 mg/dL — ABNORMAL HIGH (ref 6–23)
CO2: 27 mEq/L (ref 19–32)
Calcium: 9.1 mg/dL (ref 8.4–10.5)
Chloride: 103 mEq/L (ref 96–112)
GFR calc Af Amer: 43 mL/min — ABNORMAL LOW (ref >90)
GFR calc non Af Amer: 37 mL/min — ABNORMAL LOW (ref >90)
Glucose, Bld: 99 mg/dL (ref 70–99)
Potassium: 4.7 mEq/L (ref 3.5–5.1)
Sodium: 139 mEq/L (ref 135–145)

## 2013-09-09 MED ORDER — CARVEDILOL 3.125 MG PO TABS
3.1250 mg | ORAL_TABLET | Freq: Two times a day (BID) | ORAL | Status: DC
  Filled 2013-09-09 (×2): qty 1

## 2013-09-09 MED ORDER — RAMIPRIL 2.5 MG PO CAPS: 2.5000 mg | ORAL_CAPSULE | Freq: Every day | ORAL | Status: DC

## 2013-09-09 MED ORDER — CARVEDILOL 6.25 MG PO TABS: 3.1250 mg | ORAL_TABLET | Freq: Two times a day (BID) | ORAL | Status: DC

## 2013-09-09 MED ORDER — QUINIDINE GLUCONATE ER 324 MG PO TBCR: 324.0000 mg | EXTENDED_RELEASE_TABLET | Freq: Two times a day (BID) | ORAL | Status: DC

## 2013-09-09 NOTE — Discharge Summary (Signed)
ELECTROPHYSIOLOGY DISCHARGE SUMMARY    Patient ID: IRVEN INGALSBE,  MRN: [id number], DOB/AGE: Jan 07, 1941 72 y.o.  Admit date: 09/06/2013 Discharge date: 09/09/2013  Primary Care Physician: [name], MD Primary Cardiologist: Berton Mount, MD  Primary Discharge Diagnosis:  1. Recurrent VT s/p ICD shock  Secondary Discharge Diagnoses:  1. Ischemic CM, EF 20-25% 2. Chronic systolic HF 3. CAD 4. CKD 5. HTN 6. PVD 7. Dyslipidemia 8. Hypothyroidism 9. Prior CVA 10. OSA  Procedures This Admission:  1. 2D echocardiogram Study Conclusions - Left ventricle: The cavity size was severely dilated. Wall thickness was normal. Systolic function was severely reduced. The estimated ejection fraction was in the range of 20% to 25%. Diffuse hypokinesis. There is akinesis of the inferior, posterior and lateral myocardium. Features are consistent with a pseudonormal left ventricular filling pattern, with concomitant abnormal relaxation and increased filling pressure (grade 2 diastolic dysfunction). Doppler parameters are consistent with high ventricular filling pressure. - Aortic valve: Mild regurgitation. - Mitral valve: Mild regurgitation. - Left atrium: The atrium was moderately dilated. - Right ventricle: Systolic function was mildly reduced. - Pulmonary arteries: PA peak pressure: 31mm Hg (S).  History and Hospital Course:  Alessio Bogan is a 72 yo man with an ischemic CM s/p ICD implant, chronic systolic HF, paroxysmal VT, CAD and HTN who was admitted on 09/06/2013 after a syncopal episode. He was in his kitchen preparing lunch when he had the sudden onset of lightheadedness then syncope. No chest pain or palpitations. He did not hit his head. He lost consciousness only briefly. In the ED his device was interrogated showed a 9 second run of NSVT, rate of 180 bpm. No therapy attempted. This correlated to his syncopal episode. He has been in his usual state of health otherwise  without CP, SOB, palpitations, LE swelling, orthopnea or PND. He was admitted and continued on Coreg and amiodarone. Quinidine was added. Dr. Graciela Husbands discussed case with Dr. Gala Romney who recommended CPX and follow-up in HF clinic. Mr. Krichbaum has not had any recurrent ventricular arrhythmia since admission and remains hemodynamically stable. He has been seen, examined and deemed stable for discharge home today by Dr. Berton Mount.   Discharge Vitals: Blood pressure 88/56, pulse 72, temperature 98.4 F (36.9 C), temperature source Oral, resp. rate 20, height 6' (1.829 m), weight 187 lb 14.4 oz (85.231 kg), SpO2 99.00%.   Labs: Lab Results  Component Value Date   WBC 5.8 09/06/2013   HGB 12.5* 09/06/2013   HCT 36.2* 09/06/2013   MCV 98.4 09/06/2013   PLT 137* 09/06/2013    Recent Labs Lab 09/09/13 0501  NA 139  K 4.7  CL 103  CO2 27  BUN 27*  CREATININE 1.74*  CALCIUM 9.1  GLUCOSE 99   Recent Labs  09/06/13 1453  INR 1.19    Disposition:  The patient is being discharged in stable condition.  Follow-up: Follow-up Information   Follow up with [facility] On 09/22/2013. (At 11:30 AM for CPX testing)    Specialty:  Cardiology   Contact information:   [address] [id number] [place] [address] [phone]      Follow up with [facility] On 09/24/2013. (At 11:40 AM to establish in HF clinic)    Specialty:  Cardiology   Contact information:   [address] [id number] [place] [address] [phone]      Follow up with Sherryl Manges, MD On  10/22/2013. (At 4:00 PM)    Specialty:  Cardiology   Contact information:   [address] [place] [address] [phone]      Discharge Medications:    Medication List         amiodarone 200 MG tablet  Commonly known as:  PACERONE  Take 2 tablets (400 mg total) by mouth daily.     aspirin 81 MG  tablet  Take 81 mg by mouth daily.     atorvastatin 80 MG tablet  Commonly known as:  LIPITOR  Take 80 mg by mouth daily.     CALCIUM-MAGNESIUM-ZINC PO  Take 3 tablets by mouth at bedtime. 1000 mg calcium, 400 mg magnesium, 25 mg zinc     carvedilol 6.25 MG tablet  Commonly known as:  COREG  Take 0.5 tablets (3.125 mg total) by mouth 2 (two) times daily with a meal.     clopidogrel 75 MG tablet  Commonly known as:  PLAVIX  Take 75 mg by mouth daily.     EYE VITAMINS PO  Take 1 capsule by mouth 2 (two) times daily.     fexofenadine 180 MG tablet  Commonly known as:  ALLEGRA  Take 180 mg by mouth daily as needed (for allergies).     Fish Oil 1200 MG Caps  Take 1 capsule by mouth daily.     furosemide 20 MG tablet  Commonly known as:  LASIX  Take 20 mg by mouth daily.     levothyroxine 112 MCG tablet  Commonly known as:  SYNTHROID  Take 1 tablet (112 mcg total) by mouth daily.     multivitamin tablet  Take 1 tablet by mouth daily.     nitroGLYCERIN 0.4 MG SL tablet  Commonly known as:  NITROSTAT  Place 0.4 mg under the tongue every 5 (five) minutes as needed for chest pain. For chest pain     quiniDINE gluconate 324 MG CR tablet  Take 1 tablet (324 mg total) by mouth every 12 (twelve) hours.     ramipril 2.5 MG capsule  Commonly known as:  ALTACE  Take 1 capsule (2.5 mg total) by mouth daily.       Duration of Discharge Encounter: Greater than 30 minutes including physician time.  Signed, Rick Duff, PA-C 09/09/2013, 11:30 AM

## 2013-09-09 NOTE — Progress Notes (Signed)
Patient: [name] Date of Encounter: 09/09/2013, 9:34 AM Admit date: 09/06/2013     Subjective  [name] has no complaints this AM. He is eager to go home.   Objective  Physical Exam: Vitals: BP 88/56  Pulse 72  Temp(Src) 98.4 F (36.9 C) (Oral)  Resp 20  Ht 6' (1.829 m)  Wt 187 lb 14.4 oz (85.231 kg)  BMI 25.48 kg/m2  SpO2 99% General: Well developed, well appearing 72 year old male in no acute distress. Neck: Supple. JVD not elevated. Lungs: Clear bilaterally to auscultation without wheezes, rales, or rhonchi. Breathing is unlabored. Heart: Regular S1 S2 without murmurs, rubs, or gallops.  Abdomen: Soft, non-distended. Extremities: No clubbing or cyanosis. No edema.  Distal pedal pulses are 2+ and equal bilaterally. Neuro: Alert and oriented X 3. Moves all extremities spontaneously. No focal deficits.  Intake/Output:  Intake/Output Summary (Last 24 hours) at 09/09/13 0934 Last data filed at 09/09/13 0735  Gross per 24 hour  Intake    840 ml  Output   1421 ml  Net   -581 ml    Inpatient Medications:  . amiodarone  400 mg Oral Daily  . aspirin  81 mg Oral Daily  . atorvastatin  80 mg Oral q1800  . carvedilol  6.25 mg Oral BID WC  . clopidogrel  75 mg Oral Daily  . furosemide  20 mg Oral Daily  . levothyroxine  112 mcg Oral Daily  . quiniDINE gluconate  324 mg Oral Q12H  . ramipril  5 mg Oral Daily  . sodium chloride  3 mL Intravenous Q12H    Labs:  Recent Labs  09/06/13 1453  09/08/13 0500 09/09/13 0501  NA 139  < > 139 139  K 4.8  < > 5.0 4.7  CL 104  < > 104 103  CO2 27  < > 28 27  GLUCOSE 84  < > 99 99  BUN 29*  < > 25* 27*  CREATININE 1.61*  < > 1.69* 1.74*  CALCIUM 9.3  < > 9.3 9.1  MG 2.4  --   --   --   < > = values in this interval not displayed.   Recent Labs  09/06/13 1453 09/06/13 1907  WBC 9.5 5.8  NEUTROABS 7.4 3.4  HGB 11.3* 12.5*  HCT 34.4* 36.2*  MCV 88.0 98.4  PLT 291 137*    Recent Labs  09/06/13 1907    TSH 6.323*    Recent Labs  09/06/13 1453  INR 1.19    Radiology/Studies: Dg Chest Port 1 View 09/06/2013   IMPRESSION: Stable borderline cardiomegaly. No active lung disease.  Electronically Signed   By: Myles Rosenthal M.D.   On: 09/06/2013 14:53   Echocardiogram: Study Conclusions - Left ventricle: The cavity size was severely dilated. Wall thickness was normal. Systolic function was severely reduced. The estimated ejection fraction was in the range of 20% to 25%. Diffuse hypokinesis. There is akinesis of the inferior, posterior and lateral myocardium. Features are consistent with a pseudonormal left ventricular filling pattern, with concomitant abnormal relaxation and increased filling pressure (grade 2 diastolic dysfunction). Doppler parameters are consistent with high ventricular filling pressure. - Aortic valve: Mild regurgitation. - Mitral valve: Mild regurgitation. - Left atrium: The atrium was moderately dilated. - Right ventricle: Systolic function was mildly reduced. - Pulmonary arteries: PA peak pressure: 31mm Hg (S).  Telemetry: no further VT   Assessment and Plan  1. Recurrent VT 2. Ischemic  CM, EF 20-25% 3. Chronic systolic HF 4. CAD 5. Elevated TSH, on levothyroxine 6. CKD  Plan as outlined by Dr. Graciela Husbands. Probable DC home today with quinidine and amiodarone. For CPX on 11/18 then CHF visit.   Signed, EDMISTEN, BROOKE PA-C  With low bp will decrease coreg and altace

## 2013-09-10 ENCOUNTER — Encounter (HOSPITAL_COMMUNITY): Payer: Medicare Other

## 2013-09-10 ENCOUNTER — Other Ambulatory Visit (HOSPITAL_COMMUNITY): Payer: Medicare Other

## 2013-09-10 ENCOUNTER — Other Ambulatory Visit: Payer: Self-pay

## 2013-09-15 ENCOUNTER — Emergency Department (HOSPITAL_COMMUNITY): Payer: Medicare Other

## 2013-09-15 ENCOUNTER — Encounter (HOSPITAL_COMMUNITY): Payer: Self-pay | Admitting: Emergency Medicine

## 2013-09-15 ENCOUNTER — Inpatient Hospital Stay (HOSPITAL_COMMUNITY)
Admission: EM | Admit: 2013-09-15 | Discharge: 2013-09-29 | DRG: 286 | Disposition: A | Discharge status: Home Health | Payer: Medicare Other | Attending: Internal Medicine | Admitting: Internal Medicine

## 2013-09-15 DIAGNOSIS — E039 Hypothyroidism, unspecified: Secondary | ICD-10-CM | POA: Diagnosis present

## 2013-09-15 DIAGNOSIS — A419 Sepsis, unspecified organism: Secondary | ICD-10-CM

## 2013-09-15 DIAGNOSIS — I472 Ventricular tachycardia, unspecified: Principal | ICD-10-CM

## 2013-09-15 DIAGNOSIS — Z515 Encounter for palliative care: Secondary | ICD-10-CM

## 2013-09-15 DIAGNOSIS — T50905A Adverse effect of unspecified drugs, medicaments and biological substances, initial encounter: Secondary | ICD-10-CM

## 2013-09-15 DIAGNOSIS — Z79899 Other long term (current) drug therapy: Secondary | ICD-10-CM

## 2013-09-15 DIAGNOSIS — I252 Old myocardial infarction: Secondary | ICD-10-CM | POA: Diagnosis present

## 2013-09-15 DIAGNOSIS — I5023 Acute on chronic systolic (congestive) heart failure: Secondary | ICD-10-CM

## 2013-09-15 DIAGNOSIS — Z85820 Personal history of malignant melanoma of skin: Secondary | ICD-10-CM | POA: Diagnosis present

## 2013-09-15 DIAGNOSIS — M129 Arthropathy, unspecified: Secondary | ICD-10-CM | POA: Diagnosis present

## 2013-09-15 DIAGNOSIS — I4729 Other ventricular tachycardia: Principal | ICD-10-CM | POA: Diagnosis present

## 2013-09-15 DIAGNOSIS — I739 Peripheral vascular disease, unspecified: Secondary | ICD-10-CM | POA: Diagnosis present

## 2013-09-15 DIAGNOSIS — Z87891 Personal history of nicotine dependence: Secondary | ICD-10-CM | POA: Diagnosis present

## 2013-09-15 DIAGNOSIS — R531 Weakness: Secondary | ICD-10-CM

## 2013-09-15 DIAGNOSIS — Q619 Cystic kidney disease, unspecified: Secondary | ICD-10-CM | POA: Diagnosis present

## 2013-09-15 DIAGNOSIS — Z8249 Family history of ischemic heart disease and other diseases of the circulatory system: Secondary | ICD-10-CM | POA: Diagnosis present

## 2013-09-15 DIAGNOSIS — I255 Ischemic cardiomyopathy: Secondary | ICD-10-CM

## 2013-09-15 DIAGNOSIS — J690 Pneumonitis due to inhalation of food and vomit: Secondary | ICD-10-CM

## 2013-09-15 DIAGNOSIS — I447 Left bundle-branch block, unspecified: Secondary | ICD-10-CM | POA: Diagnosis present

## 2013-09-15 DIAGNOSIS — I2589 Other forms of chronic ischemic heart disease: Secondary | ICD-10-CM

## 2013-09-15 DIAGNOSIS — I2510 Atherosclerotic heart disease of native coronary artery without angina pectoris: Secondary | ICD-10-CM

## 2013-09-15 DIAGNOSIS — I10 Essential (primary) hypertension: Secondary | ICD-10-CM | POA: Diagnosis present

## 2013-09-15 DIAGNOSIS — R509 Fever, unspecified: Secondary | ICD-10-CM

## 2013-09-15 DIAGNOSIS — Z9861 Coronary angioplasty status: Secondary | ICD-10-CM

## 2013-09-15 DIAGNOSIS — Z8673 Personal history of transient ischemic attack (TIA), and cerebral infarction without residual deficits: Secondary | ICD-10-CM | POA: Diagnosis present

## 2013-09-15 DIAGNOSIS — E871 Hypo-osmolality and hyponatremia: Secondary | ICD-10-CM | POA: Diagnosis not present

## 2013-09-15 DIAGNOSIS — E785 Hyperlipidemia, unspecified: Secondary | ICD-10-CM | POA: Diagnosis present

## 2013-09-15 DIAGNOSIS — L932 Other local lupus erythematosus: Secondary | ICD-10-CM

## 2013-09-15 DIAGNOSIS — Z95810 Presence of automatic (implantable) cardiac defibrillator: Secondary | ICD-10-CM

## 2013-09-15 DIAGNOSIS — Z7982 Long term (current) use of aspirin: Secondary | ICD-10-CM

## 2013-09-15 DIAGNOSIS — I5022 Chronic systolic (congestive) heart failure: Secondary | ICD-10-CM

## 2013-09-15 DIAGNOSIS — I4719 Other supraventricular tachycardia: Secondary | ICD-10-CM

## 2013-09-15 DIAGNOSIS — R079 Chest pain, unspecified: Secondary | ICD-10-CM

## 2013-09-15 DIAGNOSIS — R5381 Other malaise: Secondary | ICD-10-CM

## 2013-09-15 DIAGNOSIS — Z9889 Other specified postprocedural states: Secondary | ICD-10-CM

## 2013-09-15 DIAGNOSIS — I471 Supraventricular tachycardia: Secondary | ICD-10-CM

## 2013-09-15 LAB — MRSA PCR SCREENING: MRSA by PCR: NEGATIVE

## 2013-09-15 LAB — CBC
Hemoglobin: 12.2 g/dL — ABNORMAL LOW (ref 13.0–17.0)
MCH: 32.8 pg (ref 26.0–34.0)
MCHC: 32.6 g/dL (ref 30.0–36.0)
Platelets: 124 10*3/uL — ABNORMAL LOW (ref 150–400)
WBC: 5.8 10*3/uL (ref 4.0–10.5)

## 2013-09-15 LAB — BASIC METABOLIC PANEL
Calcium: 9.4 mg/dL (ref 8.4–10.5)
GFR calc Af Amer: 36 mL/min — ABNORMAL LOW (ref >90)
GFR calc non Af Amer: 31 mL/min — ABNORMAL LOW (ref >90)
Glucose, Bld: 116 mg/dL — ABNORMAL HIGH (ref 70–99)
Potassium: 5 mEq/L (ref 3.5–5.1)
Sodium: 141 mEq/L (ref 135–145)

## 2013-09-15 LAB — PRO B NATRIURETIC PEPTIDE: Pro B Natriuretic peptide (BNP): 2704 pg/mL — ABNORMAL HIGH (ref 0–125)

## 2013-09-15 LAB — TROPONIN I: Troponin I: <0.3 ng/mL (ref <0.30)

## 2013-09-15 LAB — POCT I-STAT TROPONIN I: Troponin i, poc: 0.03 ng/mL (ref 0.00–0.08)

## 2013-09-15 MED ORDER — CARVEDILOL 3.125 MG PO TABS
3.1250 mg | ORAL_TABLET | Freq: Two times a day (BID) | ORAL | Status: DC
  Administered 2013-09-15 – 2013-09-18 (×7): 3.125 mg via ORAL
  Filled 2013-09-15 (×9): qty 1

## 2013-09-15 MED ORDER — KETAMINE HCL 10 MG/ML IJ SOLN
INTRAMUSCULAR | Status: AC | PRN
  Administered 2013-09-15: 50 mg via INTRAVENOUS

## 2013-09-15 MED ORDER — FUROSEMIDE 20 MG PO TABS
20.0000 mg | ORAL_TABLET | Freq: Every day | ORAL | Status: DC
  Administered 2013-09-15 – 2013-09-20 (×6): 20 mg via ORAL
  Filled 2013-09-15 (×7): qty 1

## 2013-09-15 MED ORDER — PROPOFOL 10 MG/ML IV BOLUS
50.0000 mg | Freq: Once | INTRAVENOUS | Status: AC
  Filled 2013-09-15: qty 20

## 2013-09-15 MED ORDER — QUINIDINE GLUCONATE ER 324 MG PO TBCR
324.0000 mg | EXTENDED_RELEASE_TABLET | Freq: Two times a day (BID) | ORAL | Status: DC
  Administered 2013-09-15 – 2013-09-21 (×14): 324 mg via ORAL
  Filled 2013-09-15 (×16): qty 1

## 2013-09-15 MED ORDER — ASPIRIN 81 MG PO CHEW
324.0000 mg | CHEWABLE_TABLET | ORAL | Status: AC
  Administered 2013-09-15: 324 mg via ORAL
  Filled 2013-09-15: qty 4

## 2013-09-15 MED ORDER — NITROGLYCERIN 0.4 MG SL SUBL
0.4000 mg | SUBLINGUAL_TABLET | Freq: Once | SUBLINGUAL | Status: AC
  Administered 2013-09-15: 0.4 mg via SUBLINGUAL
  Filled 2013-09-15: qty 25

## 2013-09-15 MED ORDER — LEVOTHYROXINE SODIUM 112 MCG PO TABS
112.0000 ug | ORAL_TABLET | Freq: Every day | ORAL | Status: DC
  Administered 2013-09-15 – 2013-09-29 (×14): 112 ug via ORAL
  Filled 2013-09-15 (×16): qty 1

## 2013-09-15 MED ORDER — ASPIRIN 81 MG PO CHEW: 81.0000 mg | CHEWABLE_TABLET | Freq: Every day | ORAL | Status: DC

## 2013-09-15 MED ORDER — CLOPIDOGREL BISULFATE 75 MG PO TABS
75.0000 mg | ORAL_TABLET | Freq: Every day | ORAL | Status: DC
  Administered 2013-09-15 – 2013-09-20 (×6): 75 mg via ORAL
  Filled 2013-09-15 (×8): qty 1

## 2013-09-15 MED ORDER — HEPARIN SODIUM (PORCINE) 5000 UNIT/ML IJ SOLN
5000.0000 [IU] | Freq: Three times a day (TID) | INTRAMUSCULAR | Status: DC
  Administered 2013-09-15 – 2013-09-17 (×7): 5000 [IU] via SUBCUTANEOUS
  Filled 2013-09-15 (×12): qty 1

## 2013-09-15 MED ORDER — ASPIRIN 81 MG PO CHEW
324.0000 mg | CHEWABLE_TABLET | Freq: Once | ORAL | Status: AC
  Administered 2013-09-15: 324 mg via ORAL
  Filled 2013-09-15: qty 4

## 2013-09-15 MED ORDER — PROPOFOL 10 MG/ML IV BOLUS
INTRAVENOUS | Status: AC | PRN
  Administered 2013-09-15: 50 mg via INTRAVENOUS

## 2013-09-15 MED ORDER — ASPIRIN 300 MG RE SUPP
300.0000 mg | RECTAL | Status: AC
  Filled 2013-09-15: qty 1

## 2013-09-15 MED ORDER — NITROGLYCERIN 0.4 MG SL SUBL
0.4000 mg | SUBLINGUAL_TABLET | SUBLINGUAL | Status: DC | PRN
  Administered 2013-09-18 – 2013-09-24 (×3): 0.4 mg via SUBLINGUAL
  Filled 2013-09-15 (×4): qty 25

## 2013-09-15 MED ORDER — ATORVASTATIN CALCIUM 80 MG PO TABS
80.0000 mg | ORAL_TABLET | Freq: Every day | ORAL | Status: DC
  Administered 2013-09-15 – 2013-09-23 (×9): 80 mg via ORAL
  Filled 2013-09-15 (×10): qty 1

## 2013-09-15 MED ORDER — SODIUM CHLORIDE 0.9 % IV SOLN
INTRAVENOUS | Status: AC | PRN
  Administered 2013-09-15: 125 mL/h via INTRAVENOUS

## 2013-09-15 MED ORDER — ASPIRIN EC 81 MG PO TBEC
81.0000 mg | DELAYED_RELEASE_TABLET | Freq: Every day | ORAL | Status: DC
  Administered 2013-09-16 – 2013-09-29 (×14): 81 mg via ORAL
  Filled 2013-09-15 (×14): qty 1

## 2013-09-15 MED ORDER — KETAMINE HCL 10 MG/ML IJ SOLN
50.0000 mg | Freq: Once | INTRAMUSCULAR | Status: AC
  Filled 2013-09-15: qty 5

## 2013-09-15 MED ORDER — ACETAMINOPHEN 325 MG PO TABS
650.0000 mg | ORAL_TABLET | ORAL | Status: DC | PRN
  Administered 2013-09-18 – 2013-09-21 (×8): 650 mg via ORAL
  Filled 2013-09-15 (×11): qty 2

## 2013-09-15 MED ORDER — AMIODARONE HCL 200 MG PO TABS
400.0000 mg | ORAL_TABLET | Freq: Every day | ORAL | Status: DC
  Administered 2013-09-15 – 2013-09-29 (×15): 400 mg via ORAL
  Filled 2013-09-15 (×15): qty 2

## 2013-09-15 MED ORDER — RAMIPRIL 2.5 MG PO CAPS
2.5000 mg | ORAL_CAPSULE | Freq: Every day | ORAL | Status: DC
  Administered 2013-09-15 – 2013-09-19 (×5): 2.5 mg via ORAL
  Filled 2013-09-15 (×5): qty 1

## 2013-09-15 MED ORDER — ONDANSETRON HCL 4 MG/2ML IJ SOLN
4.0000 mg | Freq: Four times a day (QID) | INTRAMUSCULAR | Status: DC | PRN
  Administered 2013-09-19 – 2013-09-24 (×4): 4 mg via INTRAVENOUS
  Filled 2013-09-15 (×4): qty 2

## 2013-09-15 NOTE — ED Notes (Signed)
Ketamine returned to pharmacy by [facility].

## 2013-09-15 NOTE — ED Provider Notes (Addendum)
CSN: [id number]     Arrival date & time 09/15/13  0349 History   First MD Initiated Contact with Patient 09/15/13 [phone]     Chief Complaint  Patient presents with  . Chest Pain   (Consider location/radiation/quality/duration/timing/severity/associated sxs/prior Treatment) HPI Patient with extensive cardiac history including multiple myocardial infarctions and now with ischemic cardiomyopathy and EF of 20-25% was admitted earlier this month for syncope do to ventricular tachycardia. Quinine was added to his medical regimen. He presents with chest pain that started at 12:30 AM which seemed to improve when he sat on the couch but has worsened in the last couple of hours. Is associated with lightheadedness, generalized weakness, shortness of breath and tingling in his bilateral arms. Chest pain as dull and aching it does not radiate. Past Medical History  Diagnosis Date  . CAD (coronary artery disease)     s/p MI 1982;  s/p DES to CFX 2011;  s/p NSTEMI 4/12 in setting of VTach;  cath 7/12:  LM ok, LAD mid 30-40%, Dx's with 40%, mCFX stent patent, prox to mid RCA occluded with R to R and L to R collats.  Medical Tx was continued  . Bradycardia   . Ventricular tachycardia     s/p AICD;   h/o ICD shock;  Amiodarone Rx. S/P ablation in 2012  . 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 HTN (hypertension)   . HLD (hyperlipidemia)   . Cytopenia   . Hypothyroidism   . Renal insufficiency   . Claudication   . Chronic systolic heart failure   . Ischemic cardiomyopathy     echo 7/12:  EF 25-30%, mild AI, mod MR, mod LAE  . TIA (transient ischemic attack) 2001  . CVD (cerebrovascular disease)     left CEA 2008  . Stroke   . Anemia   . Inappropriate therapy from implantable cardioverter-defibrillator 04/02/2013    ATP for sinus tach  SVT wavelet identified SVT but was no   passive   . Myocardial infarction 1992  . Anginal pain   . Automatic implantable cardioverter-defibrillator in situ   . Sleep apnea     denies wearing mask (05/25/2013)  . Arthritis     "a little in my knees" (05/25/2013)  . Melanoma of ear 2013    "near left ear" (05/25/2013)   Past Surgical History  Procedure Laterality Date  . Femoral-popliteal bypass graft Right 2011  . Cardiac defibrillator placement  2003; 2005; 05/25/2013    [employer] "recalled in 2005 and replaced, replaced w/one that paces too"  . Revascularization / in-situ graft leg    . Renal artery bypass Bilateral 2003  . Back surgery    . Lumbar disc surgery  1974; 2000's  . Knee arthroscopy Bilateral 1990's  . Elbow arthroscopy Right 1990's  . Cholecystectomy  12/15/?2010  . Lipoma excision Left 2013    "near ear" (05/25/2013)  . Heel spur surgery Right 1990's  . Cataract extraction w/ intraocular lens  implant, bilateral Bilateral 2000  . Carotid endarterectomy Left ~ 2007  . Doppler echocardiography  2011  . Tonsillectomy  1946  . Femoral-popliteal bypass graft Left 05/2002    Hattie Perch 05/06/2002 (05/25/2013)  . Tumor excision Left 1960's    "fatty tumor" (05/25/2013)  . Cardiac electrophysiology study and ablation  2012  . Coronary angioplasty  1992  . Cardiac catheterization      "several" (05/25/2013)  . Coronary angioplasty with stent placement      "last one was 11/2012  (05/25/2013)   Family History  Problem Relation Age of Onset  . Coronary artery disease    . Heart attack Mother   . Coronary artery disease Mother   . Coronary artery disease Father   . Stroke Father    History  Substance Use Topics  . Smoking status: Former Smoker -- 0.50 packs/day for 37 years    Types: Cigarettes    Quit date: 11/05/2001  . Smokeless tobacco: Never Used  . Alcohol Use: 0.0 oz/week     Comment: 05/25/2013 "mixed drink couple times/month"    Review of Systems  Constitutional: Negative for fever and chills.   Respiratory: Positive for shortness of breath. Negative for cough.   Cardiovascular: Positive for chest pain. Negative for palpitations and leg swelling.  Gastrointestinal: Negative for nausea, vomiting, abdominal pain and diarrhea.  Musculoskeletal: Negative for back pain, neck pain and neck stiffness.  Skin: Negative for rash and wound.  Neurological: Positive for dizziness, weakness (generalized) and light-headedness. Negative for syncope, numbness and headaches.  All other systems reviewed and are negative.    Allergies  Meperidine hcl and Opium  Home Medications   Current Outpatient Rx  Name  Route  Sig  Dispense  Refill  . amiodarone (PACERONE) 200 MG tablet   Oral   Take 2 tablets (400 mg total) by mouth daily.   30 tablet   4   . aspirin 81 MG tablet   Oral   Take 81 mg by mouth daily.          [name] atorvastatin (LIPITOR) 80 MG tablet   Oral   Take 80 mg by mouth daily.          [name] CALCIUM-MAGNESIUM-ZINC PO   Oral   Take 3 tablets by mouth at bedtime. 1000 mg calcium, 400 mg magnesium, 25 mg zinc         . carvedilol (COREG) 6.25 MG tablet   Oral   Take 0.5 tablets (3.125 mg total) by mouth 2 (two) times daily with a meal.   60 tablet   4   . clopidogrel (PLAVIX) 75 MG tablet   Oral   Take 75 mg by mouth daily.          . fexofenadine (ALLEGRA) 180 MG tablet   Oral   Take 180 mg by mouth daily as needed (for allergies).         . furosemide (LASIX) 20 MG tablet   Oral   Take 20 mg by mouth daily.    1 tablet      . levothyroxine (SYNTHROID) 112 MCG tablet   Oral   Take 1 tablet (112 mcg total) by mouth daily.         . Multiple Vitamin (MULTIVITAMIN) tablet   Oral   Take 1 tablet by mouth daily.          . Multiple Vitamins-Minerals (EYE VITAMINS PO)   Oral   Take 1 capsule by mouth 2 (two) times daily.          . nitroGLYCERIN (NITROSTAT) 0.4 MG SL tablet   Sublingual   Place 0.4 mg under the tongue every 5 (five) minutes as  needed for chest pain. For chest pain         . Omega-3 Fatty Acids (  FISH OIL) 1200 MG CAPS   Oral   Take 1 capsule by mouth daily.         [name] quiniDINE gluconate 324 MG CR tablet   Oral   Take 1 tablet (324 mg total) by mouth every 12 (twelve) hours.   60 tablet   4   . ramipril (ALTACE) 2.5 MG capsule   Oral   Take 1 capsule (2.5 mg total) by mouth daily.   30 capsule   3    BP 98/73  Pulse 113  Temp(Src) 98 F (36.7 C) (Oral)  Resp 16  Ht 6' (1.829 m)  Wt 190 lb (86.183 kg)  BMI 25.76 kg/m2  SpO2 100% Physical Exam  Nursing note and vitals reviewed. Constitutional: He is oriented to person, place, and time. He appears well-developed and well-nourished. No distress.  HENT:  Head: Normocephalic and atraumatic.  Mouth/Throat: Oropharynx is clear and moist.  Eyes: EOM are normal. Pupils are equal, round, and reactive to light.  Neck: Normal range of motion. Neck supple.  Cardiovascular: Regular rhythm.   Tachycardia  Pulmonary/Chest: Effort normal and breath sounds normal. No respiratory distress. He has no wheezes. He has no rales. He exhibits no tenderness.  Pacer defibrillator in the skin in the left upper chest. Mild discoloration of the overlying skin.  Abdominal: Soft. Bowel sounds are normal. He exhibits no distension and no mass. There is no tenderness. There is no rebound and no guarding.  Musculoskeletal: Normal range of motion. He exhibits no edema and no tenderness.  No pedal edema. No calf pain or swelling.  Neurological: He is alert and oriented to person, place, and time.  Patients waited awake and alert. Moves all extremities without deficit. Sensation grossly intact.  Skin: Skin is warm and dry. No rash noted. No erythema.  Psychiatric: He has a normal mood and affect. His behavior is normal.    ED Course  Procedural sedation Date/Time: 09/15/2013 6:14 AM Performed by: Loren Racer Authorized by: Ranae Palms, Myleah Cavendish Consent: written consent  obtained. Risks and benefits: risks, benefits and alternatives were discussed Time out: Immediately prior to procedure a "time out" was called to verify the correct patient, procedure, equipment, support staff and site/side marked as required. Patient sedated: yes Sedatives: ketamine and propofol Vitals: Vital signs were monitored during sedation. Comments: Ketamine (10 mg/ml) 3.5 ml given. Propofol (10mg /ml) 3.5 ml given. Patient sedated but maintained respiratory drive and airway reflexes. Blood pressure remained stable throughout. Oxygen saturations stable throughout. Patient tolerated well   (including critical care time) Labs Review Labs Reviewed  CBC  BASIC METABOLIC PANEL  PRO B NATRIURETIC PEPTIDE   Imaging Review No results found.  EKG Interpretation     Ventricular Rate:  113 PR Interval:  62 QRS Duration: 226 QT Interval:  477 QTC Calculation: 654 R Axis:   -56 Text Interpretation:  Ventricular tachycardia Baseline wander in lead(s) I aVL V1 V2 V3 V4 V5 V6 Significant changes have occurred           CRITICAL CARE Performed by: Ranae Palms, Selig Wampole Total critical care time: 45 min Critical care time was exclusive of separately billable procedures and treating other patients. Critical care was necessary to treat or prevent imminent or life-threatening deterioration. Critical care was time spent personally by me on the following activities: development of treatment plan with patient and/or surrogate as well as nursing, discussions with consultants, evaluation of patient's response to treatment, examination of patient, obtaining history from patient or surrogate, ordering and  performing treatments and interventions, ordering and review of laboratory studies, ordering and review of radiographic studies, pulse oximetry and re-evaluation of patient's condition.   MDM  [phone] discussed EKG with Dr. Excell Seltzer, cardiac interventional is on call. There is concerning changes from  his previous EKG but is difficult to interpret due to to the fact it is paced. Dr. Excell Seltzer reviewed EKG and advise.  0430 Dr. Excell Seltzer reviewed EKG. Thinks patient is likely in slow ventricular tachycardia. Stated he will call the fellow to see the patient in the emergency department. He'll likely need cardioversion.  0600 Electrical cardioversion performed by the fellow for which I provided procedural sedation. Patient tolerated well. Was given a synchronized shock at 100 J of energy with conversion to a paced sinus rhythm. Cardiology to admit to CCU.  Loren Racer, MD 09/15/13 [id number]  Loren Racer, MD 09/15/13 [id number]  Loren Racer, MD 09/15/13 [phone]

## 2013-09-15 NOTE — ED Notes (Signed)
Per charge nurse interrogations completed and result pending

## 2013-09-15 NOTE — ED Notes (Signed)
Pt states he awoke this morning with central chest pain and has started having numbness in his arms and some SOB in the past hour.

## 2013-09-15 NOTE — Consult Note (Signed)
Advanced Heart Failure Team Consult Note  Referring Physician: Dr. Graciela Husbands Primary Physician: Dr. [name] ([place], [place])  Reason for Consultation: Heart failure with recurrent VT  HPI:    [name] is a 72 yo man with an ischemic CM s/p ICD implant, chronic systolic HF (EF 20-25%), paroxysmal VT s/p ablation (09/2011) and repeat (06/2013), CAD, PVD (s/p R fem pop BPG 01/2010; s/p L CFA BPG 05/2002), AAA (s/p repair 04/2002), CAS (s/p L CEA 2008), HTN, arthritis, and TIA.  ECHO 09/2013: EF 20-25%, mild AI, mild MR, LA mildly dilated and RV systolic fx mildly reduced.   Patient is a part time security guard at [employer] and reports that up until about 2-3 months ago he was able to walk about 3 1/2 miles a day. Now able to walk only about 500 ft before getting SOB. Denies orthopnea or edema. Reports taking all of his medications as prescribed and following a low salt diet. Weight has been stable at home 190-195 lbs.  Over the past 2 months has experienced multiple episodes of Vtach with ICD shocks.He was last admitted to the hosptial 09/06/13-09/09/13 for recurrent VT s/p ICD shock and Qunidine was started. He was supposed to have a CPX test on the OP side and be seen in the HF clinic how presented back to the ED 09/15/13 with CP and was found to be in Vtach rate of 113, which he was cardioverted successfully.   SH: Still works PT as Engineer, materials. Married.   Review of Systems: [y] = yes, [ ]  = no   General: Weight gain [ ] ; Weight loss [ ] ; Anorexia [ ] ; Fatigue [ Y]; Fever [ ] ; Chills [ ] ; Weakness [ ]   Cardiac: Chest pain/pressure [ ] ; Resting SOB [ N]; Exertional SOB [ Y]; Myer Peer ]; Pedal Edema [N ]; Palpitations [ ] ; Syncope [ ] ; Presyncope [ ] ; Paroxysmal nocturnal dyspnea[ ]   Pulmonary: Cough [ ] ; Wheezing[ ] ; Hemoptysis[ ] ; Sputum [ ] ; Snoring [ ]   GI: Vomiting[ ] ; Dysphagia[ ] ; Melena[ ] ; Hematochezia [ ] ; Heartburn[ ] ; Abdominal pain [ ] ; Constipation [ ] ; Diarrhea [ ] ;  BRBPR [ ]   GU: Hematuria[ ] ; Dysuria [ ] ; Nocturia[ ]   Vascular: Pain in legs with walking [ ] ; Pain in feet with lying flat [ ] ; Non-healing sores [ ] ; Stroke [ ] ; TIA [ ] ; Slurred speech [ ] ;  Neuro: Headaches[ ] ; Vertigo[ ] ; Seizures[ ] ; Paresthesias[ ] ;Blurred vision [ ] ; Diplopia [ ] ; Vision changes [ ]   Ortho/Skin: Arthritis [Y ]; Joint pain [ ] ; Muscle pain [ ] ; Joint swelling [ ] ; Back Pain [ ] ; Rash [ ]   Psych: Depression[ ] ; Anxiety[ ]   Heme: Bleeding problems [ ] ; Clotting disorders [ ] ; Anemia [ ]   Endocrine: Diabetes [ ] ; Thyroid dysfunction[ ]   Home Medications Prior to Admission medications   Medication Sig Start Date End Date Taking? Authorizing Provider  amiodarone (PACERONE) 200 MG tablet Take 2 tablets (400 mg total) by mouth daily. 08/31/13  Yes Duke Salvia, MD  aspirin 81 MG tablet Take 81 mg by mouth daily.    Yes Historical Provider, MD  atorvastatin (LIPITOR) 80 MG tablet Take 80 mg by mouth daily.  02/05/11  Yes Historical Provider, MD  CALCIUM-MAGNESIUM-ZINC PO Take 3 tablets by mouth at bedtime. 1000 mg calcium, 400 mg magnesium, 25 mg zinc   Yes Historical Provider, MD  carvedilol (COREG) 6.25 MG tablet Take 0.5 tablets (3.125 mg total) by mouth 2 (two)  times daily with a meal. 09/09/13  Yes Brooke O Edmisten, PA-C  clopidogrel (PLAVIX) 75 MG tablet Take 75 mg by mouth daily.    Yes Historical Provider, MD  fexofenadine (ALLEGRA) 180 MG tablet Take 180 mg by mouth daily as needed (for allergies).   Yes Historical Provider, MD  furosemide (LASIX) 20 MG tablet Take 20 mg by mouth daily.  05/12/13  Yes Duke Salvia, MD  levothyroxine (SYNTHROID) 112 MCG tablet Take 1 tablet (112 mcg total) by mouth daily. 05/25/11  Yes Peter M [place], MD  Multiple Vitamin (MULTIVITAMIN) tablet Take 1 tablet by mouth daily.    Yes Historical Provider, MD  Multiple Vitamins-Minerals (EYE VITAMINS PO) Take 1 capsule by mouth 2 (two) times daily.    Yes Historical Provider, MD   nitroGLYCERIN (NITROSTAT) 0.4 MG SL tablet Place 0.4 mg under the tongue every 5 (five) minutes as needed for chest pain. For chest pain   Yes Historical Provider, MD  Omega-3 Fatty Acids (FISH OIL) 1200 MG CAPS Take 1 capsule by mouth daily.   Yes Historical Provider, MD  quiniDINE gluconate 324 MG CR tablet Take 1 tablet (324 mg total) by mouth every 12 (twelve) hours. 09/09/13  Yes Brooke O Edmisten, PA-C  ramipril (ALTACE) 2.5 MG capsule Take 1 capsule (2.5 mg total) by mouth daily. 09/09/13  Yes Minda Meo, PA-C    Past Medical History: Past Medical History  Diagnosis Date  . CAD (coronary artery disease)     s/p MI 1982;  s/p DES to CFX 2011;  s/p NSTEMI 4/12 in setting of VTach;  cath 7/12:  LM ok, LAD mid 30-40%, Dx's with 40%, mCFX stent patent, prox to mid RCA occluded with R to R and L to R collats.  Medical Tx was continued  . Bradycardia   . Ventricular tachycardia     s/p AICD;   h/o ICD shock;  Amiodarone Rx. S/P ablation in 2012  . 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 HTN (hypertension)   . HLD (hyperlipidemia)   . Cytopenia   . Hypothyroidism   . Renal insufficiency   . Claudication   . Chronic systolic heart failure   . Ischemic cardiomyopathy     echo 7/12:  EF 25-30%, mild AI, mod MR, mod LAE  . TIA (transient ischemic attack) 2001  . CVD (cerebrovascular disease)     left CEA 2008  . Stroke   . Anemia   . Inappropriate therapy from implantable cardioverter-defibrillator 04/02/2013    ATP for sinus tach  SVT wavelet identified SVT but was no  passive   . Myocardial infarction 1992  . Anginal pain   . Automatic implantable cardioverter-defibrillator in situ     Medtronic Evera device serial number [id number]    . Sleep apnea     denies wearing mask (05/25/2013)  . Arthritis     "a little in my knees" (05/25/2013)  .  Melanoma of ear 2013    "near left ear" (05/25/2013)    Past Surgical History: Past Surgical History  Procedure Laterality Date  . Femoral-popliteal bypass graft Right 2011  . Cardiac defibrillator placement  2003; 2005; 05/25/2013    2014: Medtronic Evera device serial number [id number]  . Revascularization / in-situ graft leg    .  Renal artery bypass Bilateral 2003  . Back surgery    . Lumbar disc surgery  1974; 2000's  . Knee arthroscopy Bilateral 1990's  . Elbow arthroscopy Right 1990's  . Cholecystectomy  12/15/?2010  . Lipoma excision Left 2013    "near ear" (05/25/2013)  . Heel spur surgery Right 1990's  . Cataract extraction w/ intraocular lens  implant, bilateral Bilateral 2000  . Carotid endarterectomy Left ~ 2007  . Doppler echocardiography  2011  . Tonsillectomy  1946  . Femoral-popliteal bypass graft Left 05/2002    Hattie Perch 05/06/2002 (05/25/2013)  . Tumor excision Left 1960's    "fatty tumor" (05/25/2013)  . Cardiac electrophysiology study and ablation  2012  . Coronary angioplasty  1992  . Cardiac catheterization      "several" (05/25/2013)  . Coronary angioplasty with stent placement      "last one was 11/2012  (05/25/2013)    Family History: Family History  Problem Relation Age of Onset  . Coronary artery disease    . Heart attack Mother   . Coronary artery disease Mother   . Coronary artery disease Father   . Stroke Father     Social History: History   Social History  . Marital Status: Divorced    Spouse Name: N/A    Number of Children: 2  . Years of Education: N/A   Occupational History  . retired     [employer]  . Retired     Security [employer]  . Engineer, structural at [employer]   Social History Main Topics  . Smoking status: Former Smoker -- 0.50 packs/day for 37 years    Types: Cigarettes    Quit date: 11/05/2001  . Smokeless tobacco: Never Used  . Alcohol Use: 0.0 oz/week     Comment: 05/25/2013 "mixed drink couple  times/month"  . Drug Use: No  . Sexual Activity: No   Other Topics Concern  . None   Social History Narrative   Lives with sig other.    Allergies:  Allergies  Allergen Reactions  . Meperidine Hcl Other (See Comments)    "CAN'T MOVE" CATATONIC PER PT.  [name] Opium Other (See Comments)    "CAN'T MOVE" CATATONIC PER PT.    Objective:    Vital Signs:   Temp:  [97.7 F (36.5 C)-98.2 F (36.8 C)] 98 F (36.7 C) (11/11 1218) Pulse Rate:  [69-114] 69 (11/11 1200) Resp:  [11-23] 16 (11/11 1200) BP: (94-135)/(60-81) 96/64 mmHg (11/11 1200) SpO2:  [95 %-100 %] 95 % (11/11 1200) Weight:  [190 lb (86.183 kg)] 190 lb (86.183 kg) (11/11 0403) Last BM Date: 09/14/13  Weight change: Filed Weights   09/15/13 0403  Weight: 190 lb (86.183 kg)    Intake/Output:   Intake/Output Summary (Last 24 hours) at 09/15/13 1332 Last data filed at 09/15/13 1000  Gross per 24 hour  Intake    240 ml  Output      0 ml  Net    240 ml     Physical Exam: General:  Well appearing. No resp difficulty; lying in bed HEENT: normal; L CEA scar Neck: supple. JVP 6-7 . Carotids 2+ bilat; no bruits. No lymphadenopathy or thryomegaly appreciated. Cor: PMI nondisplaced. Regular rate & rhythm. No rubs, gallops or murmurs. Lungs: clear Abdomen: soft, nontender, nondistended. No hepatosplenomegaly. No bruits or masses. Good bowel sounds. Extremities: no cyanosis, clubbing, rash, edema Neuro: alert & orientedx3, cranial nerves grossly intact. moves all 4 extremities  w/o difficulty. Affect pleasant  Telemetry: A Paced 70s  Labs: Basic Metabolic Panel:  Recent Labs Lab 09/09/13 0501 09/15/13 0425  NA 139 141  K 4.7 5.0  CL 103 105  CO2 27 25  GLUCOSE 99 116*  BUN 27* 27*  CREATININE 1.74* 2.01*  CALCIUM 9.1 9.4    Liver Function Tests: No results found for this basename: AST, ALT, ALKPHOS, BILITOT, PROT, ALBUMIN,  in the last 168 hours No results found for this basename: LIPASE, AMYLASE,  in  the last 168 hours No results found for this basename: AMMONIA,  in the last 168 hours  CBC:  Recent Labs Lab 09/15/13 0425  WBC 5.8  HGB 12.2*  HCT 37.4*  MCV 100.5*  PLT 124*    Cardiac Enzymes:  Recent Labs Lab 09/15/13 0750  TROPONINI <0.30    BNP: BNP (last 3 results)  Recent Labs  11/23/12 0901 09/15/13 0424 09/15/13 0750  PROBNP 1891.0* 2704.0* 3038.0*    CBG: No results found for this basename: GLUCAP,  in the last 168 hours  Coagulation Studies: No results found for this basename: LABPROT, INR,  in the last 72 hours  Other results: EKG: A paced 72 bpm with LBBB  Imaging: Dg Chest Port 1 View  09/15/2013   CLINICAL DATA:  Chest pain.  EXAM: PORTABLE CHEST - 1 VIEW  COMPARISON:  Chest radiograph performed 09/06/2013  FINDINGS: The lungs are well-aerated. Pulmonary vascularity is at the upper limits of normal. There is no evidence of focal opacification, pleural effusion or pneumothorax.  The cardiomediastinal silhouette is borderline normal in size. A pacemaker / AICD is noted overlying the left chest wall, with leads ending overlying the right atrium and right ventricle. No acute osseous abnormalities are seen.  IMPRESSION: No acute cardiopulmonary process seen.   Electronically Signed   By: Roanna Raider M.D.   On: 09/15/2013 04:24      Medications:     Current Medications: . amiodarone  400 mg Oral Daily  . [START ON 09/16/2013] aspirin  81 mg Oral Daily  . [START ON 09/16/2013] aspirin EC  81 mg Oral Daily  . atorvastatin  80 mg Oral Daily  . carvedilol  3.125 mg Oral BID WC  . clopidogrel  75 mg Oral Daily  . furosemide  20 mg Oral Daily  . heparin  5,000 Units Subcutaneous Q8H  . levothyroxine  112 mcg Oral QAC breakfast  . quiniDINE gluconate  324 mg Oral Q12H  . ramipril  2.5 mg Oral Daily     Infusions:      Assessment:   1) Recurrent VTach, s/p ICD shock     --ablation 09/2011 and 06/2013 2) Chronic systolic HF, EF 20-25%  (09/2013) 3) Mixed NICM/ICM 4) CAD     --s/p previous inferolateral MI.    --Chronically occluded RCA with L-> R collaterals per cath 1/14 5) PAD, severe 6) HTN 7) LBBB   Plan/Discussion:    Mr. Wortmann is a very pleasant gentleman who presented to the hospital with recurrent ICD shock. He has suffered from multiple ICD shocks in the setting of VT in the past month and was recently discharged from the hospital 09/09/13 after starting quinidine.  He also suffers from chronic systolic HF and has noticed a marked decline in his functional status. Over 3 months ago he was able to walk 3 1/2 miles with minimal SOB and now in past few months is only able to walk about 500 ft before he  becomes dyspneic. On exam he does not appear to be volume overloaded, will continue current lasix dose.  Lengthy discussion with patient about his condition and that unfortunately he is suffering from electrical and pumping issues with his heart. The best option for treatment of his condition is a heart transplant, however d/t his age and extensive PVD he would not be a candidate. He could possibly benefit from a LVAD, however it does not fix the electrical issue. With an LVAD he may not have as many symptoms if in V-tach, but could still suffer from ICD shocks.  The main issue now is to determine how bad his HF is and we will order a CPX tomorrow. If his peak VO2 is markedly decreased then will proceed with RHC to assess his hemodynamics. If his hemodynamics are decreased then the question arises whether he could benefit from LVAD. With a LVAD comes possible complications such as stroke, bleeding, and infection, along with a major surgery. With his class III-IIIb symptoms currently the risks of LVAD maybe higher than having possible frequent ICD shocks. Have discussed all of this in depth with the patient and family and will continue to discuss the plan as we move forward and have more data.   Appreciate the  consult.  Length of Stay: 0  Aundria Rud NP-C 09/15/2013, 1:32 PM Advanced Heart Failure Team Pager [phone] (M-F; 7a - 4p)  Please contact [facility] for night-coverage after hours (4p -7a ) and weekends on [email or web address]  Patient seen and examined with Ulla Potash, NP. We discussed all aspects of the encounter. I agree with the assessment and plan as stated above.   [name] has had progressive HF symptoms and recurrent VT despite 2 ablations and several AAs.  He is currently NHYA IIIb. He has been referred for consideration of advanced HF therapies. Given age and severe vascular disease he is not a transplant candidate. We discussed the role of VAD therapy in the setting of advanced HF and the fact that it may not decrease his VT burden but would likely render it more hemodynamically stable. He is interested in proceeding with further work-up. We will start with inpatient CPX testing tomorrow and if numbers are bad will proceed with RHC to further evaluate. Given LBBB, CRT upgrade also a consideration but not sure it will help his VT.  All questions answered.   Truman Hayward 5:37 PM

## 2013-09-15 NOTE — ED Notes (Signed)
1610, pt medications were mixed by dr Ranae Palms in a 50/50 solution of propofol and ketamine to equal 10 ml's, pt was given 7 of the 10 ml's by dr Ranae Palms

## 2013-09-15 NOTE — ED Notes (Signed)
Pt cardioverted with 100 joules synchronized with dr Ranae Palms and dr Ronney Asters at bedside

## 2013-09-15 NOTE — ED Notes (Signed)
Per pt stated central chest pain woke him up from sleep at 0010 and he got up and seat up on chair and went away and then around  0230 started again. Described pain dull aching with tingling to both arm and fingers. Stated SOB, gen weakness and  Lightheadedness.  Denies any nausea.

## 2013-09-15 NOTE — Consult Note (Signed)
ELECTROPHYSIOLOGY CONSULT NOTE    Patient ID: [name] MRN: [id number], DOB/AGE: 04-12-1941 72 y.o.  Admit date: 09/15/2013 Date of Consult: 09-15-2013  Primary Physician: [name], MD Primary Cardiologist: Sherryl Manges, MD  Reason for Consultation: Ventricular tachcyardia  HPI:  [name] is a 72 year old male with a longstanding history of ischemic cardiomyopathy and ventricular tachycardia.  He has undergone 2 ventricular tachycardia ablations (first in 2012 and then repeat in 06/2013).  There were outpatient plans for CPX and then evaluation for advanced heart failure therapies including VAD.  Last night, he developed chest pain that did not subside and presented to the ER for evaluation.  He was found to be in ventricular tachycardia at a rate of 113bpm that was successfully cardioverted.  He is currently on Amiodarone as well as Quinidine for rhythm control.   CHF has consulted this admission and has recommended evaluation with CPX testing tomorrow with possible RHC and then evaluation for VAD.   Device interrogation today demonstrates no therapies for VT since 08-27-2013.  He has had 3 NSVT episodes since that time.  His lower VT zone for therapy is programmed at 120bpm.   EP has been asked to evaluate for treatment options.   Past Medical History  Diagnosis Date  . CAD (coronary artery disease)     s/p MI 1982;  s/p DES to CFX 2011;  s/p NSTEMI 4/12 in setting of VTach;  cath 7/12:  LM ok, LAD mid 30-40%, Dx's with 40%, mCFX stent patent, prox to mid RCA occluded with R to R and L to R collats.  Medical Tx was continued  . Bradycardia   . Ventricular tachycardia     s/p AICD;   h/o ICD shock;  Amiodarone Rx. S/P ablation in 2012  . 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 HTN (hypertension)   . HLD (hyperlipidemia)   .  Cytopenia   . Hypothyroidism   . Renal insufficiency   . Claudication   . Chronic systolic heart failure   . Ischemic cardiomyopathy     echo 7/12:  EF 25-30%, mild AI, mod MR, mod LAE  . TIA (transient ischemic attack) 2001  . CVD (cerebrovascular disease)     left CEA 2008  . Stroke   . Anemia   . Inappropriate therapy from implantable cardioverter-defibrillator 04/02/2013    ATP for sinus tach  SVT wavelet identified SVT but was no  passive   . Myocardial infarction 1992  . Anginal pain   . Automatic implantable cardioverter-defibrillator in situ     Medtronic Evera device serial number [id number]    . Sleep apnea     denies wearing mask (05/25/2013)  . Arthritis     "a little in my knees" (05/25/2013)  . Melanoma of ear 2013    "near left ear" (05/25/2013)     Surgical History:  Past Surgical History  Procedure Laterality Date  . Femoral-popliteal bypass graft Right 2011  . Cardiac defibrillator placement  2003; 2005; 05/25/2013    2014: Medtronic Evera device serial number [id number]  . Revascularization / in-situ graft leg    . Renal artery bypass Bilateral 2003  . Back surgery    . Lumbar disc surgery  1974; 2000's  . Knee arthroscopy Bilateral 1990's  .  Elbow arthroscopy Right 1990's  . Cholecystectomy  12/15/?2010  . Lipoma excision Left 2013    "near ear" (05/25/2013)  . Heel spur surgery Right 1990's  . Cataract extraction w/ intraocular lens  implant, bilateral Bilateral 2000  . Carotid endarterectomy Left ~ 2007  . Doppler echocardiography  2011  . Tonsillectomy  1946  . Femoral-popliteal bypass graft Left 05/2002    Hattie Perch 05/06/2002 (05/25/2013)  . Tumor excision Left 1960's    "fatty tumor" (05/25/2013)  . Cardiac electrophysiology study and ablation  2012  . Coronary angioplasty  1992  . Cardiac catheterization      "several" (05/25/2013)  . Coronary angioplasty with stent placement      "last one was 11/2012  (05/25/2013)     Prescriptions prior to  admission  Medication Sig Dispense Refill  . amiodarone (PACERONE) 200 MG tablet Take 2 tablets (400 mg total) by mouth daily.  30 tablet  4  . aspirin 81 MG tablet Take 81 mg by mouth daily.       [name] atorvastatin (LIPITOR) 80 MG tablet Take 80 mg by mouth daily.       [name] CALCIUM-MAGNESIUM-ZINC PO Take 3 tablets by mouth at bedtime. 1000 mg calcium, 400 mg magnesium, 25 mg zinc      . carvedilol (COREG) 6.25 MG tablet Take 0.5 tablets (3.125 mg total) by mouth 2 (two) times daily with a meal.  60 tablet  4  . clopidogrel (PLAVIX) 75 MG tablet Take 75 mg by mouth daily.       . fexofenadine (ALLEGRA) 180 MG tablet Take 180 mg by mouth daily as needed (for allergies).      . furosemide (LASIX) 20 MG tablet Take 20 mg by mouth daily.   1 tablet    . levothyroxine (SYNTHROID) 112 MCG tablet Take 1 tablet (112 mcg total) by mouth daily.      . Multiple Vitamin (MULTIVITAMIN) tablet Take 1 tablet by mouth daily.       . Multiple Vitamins-Minerals (EYE VITAMINS PO) Take 1 capsule by mouth 2 (two) times daily.       . nitroGLYCERIN (NITROSTAT) 0.4 MG SL tablet Place 0.4 mg under the tongue every 5 (five) minutes as needed for chest pain. For chest pain      . Omega-3 Fatty Acids (FISH OIL) 1200 MG CAPS Take 1 capsule by mouth daily.      [name] quiniDINE gluconate 324 MG CR tablet Take 1 tablet (324 mg total) by mouth every 12 (twelve) hours.  60 tablet  4  . ramipril (ALTACE) 2.5 MG capsule Take 1 capsule (2.5 mg total) by mouth daily.  30 capsule  3    Inpatient Medications:  . amiodarone  400 mg Oral Daily  . [START ON 09/16/2013] aspirin  81 mg Oral Daily  . [START ON 09/16/2013] aspirin EC  81 mg Oral Daily  . atorvastatin  80 mg Oral Daily  . carvedilol  3.125 mg Oral BID WC  . clopidogrel  75 mg Oral Daily  . furosemide  20 mg Oral Daily  . heparin  5,000 Units Subcutaneous Q8H  . levothyroxine  112 mcg Oral QAC breakfast  . quiniDINE gluconate  324 mg Oral Q12H  . ramipril  2.5 mg Oral Daily      Allergies:  Allergies  Allergen Reactions  . Meperidine Hcl Other (See Comments)    "CAN'T MOVE" CATATONIC PER PT.  [name] Opium Other (See Comments)    "CAN'T MOVE"  CATATONIC PER PT.    History   Social History  . Marital Status: Divorced    Spouse Name: N/A    Number of Children: 2  . Years of Education: N/A   Occupational History  . retired     [employer]  . Retired     [employer]  . Engineer, structural at [employer]   Social History Main Topics  . Smoking status: Former Smoker -- 0.50 packs/day for 37 years    Types: Cigarettes    Quit date: 11/05/2001  . Smokeless tobacco: Never Used  . Alcohol Use: 0.0 oz/week     Comment: 05/25/2013 "mixed drink couple times/month"  . Drug Use: No  . Sexual Activity: No   Other Topics Concern  . Not on file   Social History Narrative   Lives with sig other.     Family History  Problem Relation Age of Onset  . Coronary artery disease    . Heart attack Mother   . Coronary artery disease Mother   . Coronary artery disease Father   . Stroke Father     Filed Vitals:   09/16/13 0000  BP: 116/62  Pulse: 70  Temp:   Resp: 12  Physical Exam:  General: chronically ill, NAD HEENT: OP clear  Neck: supple. JVP 6-7 . Carotids 2+ bilat; no bruits. No lymphadenopathy or thryomegaly appreciated.  CV:  Regular rate & rhythm. No rubs, gallops or murmurs.  Lungs: clear  Abdomen: soft, nontender, nondistended. No hepatosplenomegaly. No bruits or masses. Good bowel sounds.  Extremities: no cyanosis, clubbing, rash, edema  Neuro: strength/sensation are intact    Labs:   Lab Results  Component Value Date   WBC 5.8 09/15/2013   HGB 12.2* 09/15/2013   HCT 37.4* 09/15/2013   MCV 100.5* 09/15/2013   PLT 124* 09/15/2013    Recent Labs Lab 09/15/13 0425  NA 141  K 5.0  CL 105  CO2 25  BUN 27*  CREATININE 2.01*  CALCIUM 9.4  GLUCOSE 116*   Lab Results  Component Value Date   CKTOTAL 166 05/30/2011    CKMB 4.5* 05/30/2011   TROPONINI <0.30 09/15/2013   Lab Results  Component Value Date   CHOL 118 11/28/2012   CHOL 118 05/29/2011   Lab Results  Component Value Date   HDL 50.80 11/28/2012   HDL 55 05/29/2011   Lab Results  Component Value Date   LDLCALC 58 11/28/2012   LDLCALC 55 05/29/2011   Lab Results  Component Value Date   TRIG 48.0 11/28/2012   TRIG 38 05/29/2011   Lab Results  Component Value Date   CHOLHDL 2 11/28/2012   CHOLHDL 2.1 05/29/2011   No results found for this basename: LDLDIRECT    No results found for this basename: DDIMER     Radiology/Studies: Dg Chest Port 1 View  09/15/2013   CLINICAL DATA:  Chest pain.  EXAM: PORTABLE CHEST - 1 VIEW  COMPARISON:  Chest radiograph performed 09/06/2013  FINDINGS: The lungs are well-aerated. Pulmonary vascularity is at the upper limits of normal. There is no evidence of focal opacification, pleural effusion or pneumothorax.  The cardiomediastinal silhouette is borderline normal in size. A pacemaker / AICD is noted overlying the left chest wall, with leads ending overlying the right atrium and right ventricle. No acute osseous abnormalities are seen.  IMPRESSION: No acute cardiopulmonary process seen.   Electronically Signed   By: Beryle Beams.D.  On: 09/15/2013 04:24   Dg Chest Port 1 View  09/06/2013   CLINICAL DATA:  Syncope. Shortness of breath. Emphysema.  EXAM: PORTABLE CHEST - 1 VIEW  COMPARISON:  07/13/2013  FINDINGS: Heart size remains at the upper limits of normal. Dual lead transvenous pacemaker remains in appropriate position. Both lungs are clear. No evidence of pleural effusion. No mass or lymphadenopathy identified.  IMPRESSION: Stable borderline cardiomegaly. No active lung disease.   Electronically Signed   By: Myles Rosenthal M.D.   On: 09/06/2013 14:53    EKG: atrial pacing, LBBB (QRS 146 msec) Admit ekg reveals VT with LBB superior axis morphology   TELEMETRY: atrial pacing with intrinsic ventricular  conduction  DEVICE HISTORY: Medtronic ICD implanted 2003 with RV lead revision in 2005 and then gen change and upgrade to dual chamber ICD in 2014.   A/P  1. VT The patient has had refractory ventricular tachycardia for which he has undergone 2 prior VT ablations.  He has been treated with multiple AADs also.  ICD interrogation reveals adequate programming.  I am reluctant to make any changes to his device or medicines presently.  If he could tolerate more beta blocker therapy from a standpoint of his CHF then I think that coreg titration would be beneficial.  I will make no changes for now as he has a cpx planned for tomorrow and is being considered for VAD.  We will defer any further EP procedures/ medicine changes until after this workup is complete.  2. Ischemic CM/ acute on chronic systolic dysfunction He is being evaluated by the advanced CHF clinic for possible VAD. As above, when able, I think that VT management would improve with increased coreg. Though he has a LBBB, given QRS< 150 msec, I am not extremely optimistic that this would improve his class IIIB symptoms.  EP will follow along.

## 2013-09-15 NOTE — ED Notes (Signed)
Charge nurse was notified of pacemaker interrogation as requested by MD.

## 2013-09-15 NOTE — H&P (Signed)
Cardiology H&P  Primary Care Povider: [name], MD Primary Cardiologist: Dr. Graciela Husbands   HPI: Mr. Mortimer is a 72 y.o.male with hx below relevant for hx of ischemic cardiomyopathy (s/p ICD), hx of ventricular tachycardia who presented to ED with acute onset of chest pain. Episode began while patient was asleep early this AM and persisted for several hours. He presented to ED and was noted to be tachycardic. ECG and device interrogation showed ventricular tachycardia at a rate of 113.    Past Medical History  Diagnosis Date  . CAD (coronary artery disease)     s/p MI 1982;  s/p DES to CFX 2011;  s/p NSTEMI 4/12 in setting of VTach;  cath 7/12:  LM ok, LAD mid 30-40%, Dx's with 40%, mCFX stent patent, prox to mid RCA occluded with R to R and L to R collats.  Medical Tx was continued  . Bradycardia   . Ventricular tachycardia     s/p AICD;   h/o ICD shock;  Amiodarone Rx. S/P ablation in 2012  . 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 HTN (hypertension)   . HLD (hyperlipidemia)   . Cytopenia   . Hypothyroidism   . Renal insufficiency   . Claudication   . Chronic systolic heart failure   . Ischemic cardiomyopathy     echo 7/12:  EF 25-30%, mild AI, mod MR, mod LAE  . TIA (transient ischemic attack) 2001  . CVD (cerebrovascular disease)     left CEA 2008  . Stroke   . Anemia   . Inappropriate therapy from implantable cardioverter-defibrillator 04/02/2013    ATP for sinus tach  SVT wavelet identified SVT but was no  passive   . Myocardial infarction 1992  . Anginal pain   . Automatic implantable cardioverter-defibrillator in situ   . Sleep apnea     denies wearing mask (05/25/2013)  . Arthritis     "a little in my knees" (05/25/2013)  . Melanoma of ear 2013    "near left ear" (05/25/2013)    Past Surgical History  Procedure Laterality Date  .  Femoral-popliteal bypass graft Right 2011  . Cardiac defibrillator placement  2003; 2005; 05/25/2013    Medtronic "recalled in 2005 and replaced, replaced w/one that paces too"  . Revascularization / in-situ graft leg    . Renal artery bypass Bilateral 2003  . Back surgery    . Lumbar disc surgery  1974; 2000's  . Knee arthroscopy Bilateral 1990's  . Elbow arthroscopy Right 1990's  . Cholecystectomy  12/15/?2010  . Lipoma excision Left 2013    "near ear" (05/25/2013)  . Heel spur surgery Right 1990's  . Cataract extraction w/ intraocular lens  implant, bilateral Bilateral 2000  . Carotid endarterectomy Left ~ 2007  . Doppler echocardiography  2011  . Tonsillectomy  1946  . Femoral-popliteal bypass graft Left 05/2002    Hattie Perch 05/06/2002 (05/25/2013)  . Tumor excision Left 1960's    "fatty tumor" (05/25/2013)  . Cardiac electrophysiology study and ablation  2012  . Coronary angioplasty  1992  . Cardiac catheterization      "several" (05/25/2013)  . Coronary angioplasty with stent placement      "last one was 11/2012  (05/25/2013)    Family History  Problem Relation Age of Onset  .  Coronary artery disease    . Heart attack Mother   . Coronary artery disease Mother   . Coronary artery disease Father   . Stroke Father     Social History:  reports that he quit smoking about 11 years ago. His smoking use included Cigarettes. He has a 18.5 pack-year smoking history. He has never used smokeless tobacco. He reports that he drinks alcohol. He reports that he does not use illicit drugs.  Allergies:  Allergies  Allergen Reactions  . Meperidine Hcl Other (See Comments)    "CAN'T MOVE" CATATONIC PER PT.  [name] Opium Other (See Comments)    "CAN'T MOVE" CATATONIC PER PT.    No current facility-administered medications for this encounter.   Current Outpatient Prescriptions  Medication Sig Dispense Refill  . amiodarone (PACERONE) 200 MG tablet Take 2 tablets (400 mg total) by mouth daily.  30  tablet  4  . aspirin 81 MG tablet Take 81 mg by mouth daily.       [name] atorvastatin (LIPITOR) 80 MG tablet Take 80 mg by mouth daily.       [name] CALCIUM-MAGNESIUM-ZINC PO Take 3 tablets by mouth at bedtime. 1000 mg calcium, 400 mg magnesium, 25 mg zinc      . carvedilol (COREG) 6.25 MG tablet Take 0.5 tablets (3.125 mg total) by mouth 2 (two) times daily with a meal.  60 tablet  4  . clopidogrel (PLAVIX) 75 MG tablet Take 75 mg by mouth daily.       . fexofenadine (ALLEGRA) 180 MG tablet Take 180 mg by mouth daily as needed (for allergies).      . furosemide (LASIX) 20 MG tablet Take 20 mg by mouth daily.   1 tablet    . levothyroxine (SYNTHROID) 112 MCG tablet Take 1 tablet (112 mcg total) by mouth daily.      . Multiple Vitamin (MULTIVITAMIN) tablet Take 1 tablet by mouth daily.       . Multiple Vitamins-Minerals (EYE VITAMINS PO) Take 1 capsule by mouth 2 (two) times daily.       . nitroGLYCERIN (NITROSTAT) 0.4 MG SL tablet Place 0.4 mg under the tongue every 5 (five) minutes as needed for chest pain. For chest pain      . Omega-3 Fatty Acids (FISH OIL) 1200 MG CAPS Take 1 capsule by mouth daily.      [name] quiniDINE gluconate 324 MG CR tablet Take 1 tablet (324 mg total) by mouth every 12 (twelve) hours.  60 tablet  4  . ramipril (ALTACE) 2.5 MG capsule Take 1 capsule (2.5 mg total) by mouth daily.  30 capsule  3    ROS: A full review of systems is obtained and is negative except as noted in the HPI.  Physical Exam: Blood pressure 94/72, pulse 110, temperature 98 F (36.7 C), temperature source Oral, resp. rate 19, height 6' (1.829 m), weight 86.183 kg (190 lb), SpO2 100.00%.  GENERAL: no acute distress.  EYES: Extra ocular movements are intact. There is no lid lag. Sclera is anicteric.  ENT: Oropharynx is clear. Dentition is within normal limits.  NECK: Supple. The thyroid is not enlarged.  LYMPH: There are no masses or lymphadenopathy present.  HEART: Tachycardic, regular rate and rhythm. No  m/g/r.  Lady Gary a waves noted.  LUNGS: Clear to auscultation There are no rales, rhonchi, or wheezes.  ABDOMEN: Soft, non-tender, and non-distended. There is no hepatosplenomegaly.  EXTREMITIES: No clubbing, cyanosis, or edema.  PULSES: Carotids were +2  and equal bilaterally with no bruits. SKIN: Warm, dry, and intact.  NEUROLOGIC: The patient was oriented to person, place, and time. No overt neurologic deficits were detected.  PSYCH: Normal judgment and insight, mood is appropriate.   Results: Results for orders placed during the hospital encounter of 09/15/13 (from the past 24 hour(s))  POCT I-STAT TROPONIN I     Status: None   Collection Time    09/15/13  4:34 AM      Result Value Range   Troponin i, poc 0.03  0.00 - 0.08 ng/mL   Comment 3             EKG: Wide complex rhythm, rate 113. AV dissociation noted. Consistent with ventricular tachycardia.  CXR: reviewed  Assessment/Plan: 1. Slow ventricular tachycardia 2. Hx of chronic ischemic cardiomyopathy 3. S/p ICD   Plan: 1. Will plan to sedate (per ED physician) and plan for cardioversion.  2. Following DCCV, will admit to CCU.  3. C/w home cardiac program.  4. Remainder of plan pending results of mgmt strategy above.    Rithvik Orcutt 09/15/2013, 4:49 AM

## 2013-09-15 NOTE — ED Notes (Signed)
Family updated as to patient's status.

## 2013-09-16 ENCOUNTER — Inpatient Hospital Stay (HOSPITAL_COMMUNITY): Payer: Medicare Other

## 2013-09-16 ENCOUNTER — Telehealth: Payer: Self-pay | Admitting: *Deleted

## 2013-09-16 LAB — CBC
HCT: 35.5 % — ABNORMAL LOW (ref 39.0–52.0)
Hemoglobin: 11.9 g/dL — ABNORMAL LOW (ref 13.0–17.0)
WBC: 5.7 10*3/uL (ref 4.0–10.5)

## 2013-09-16 LAB — BASIC METABOLIC PANEL
BUN: 24 mg/dL — ABNORMAL HIGH (ref 6–23)
Calcium: 8.6 mg/dL (ref 8.4–10.5)
Chloride: 104 mEq/L (ref 96–112)
GFR calc Af Amer: 45 mL/min — ABNORMAL LOW (ref >90)
GFR calc non Af Amer: 39 mL/min — ABNORMAL LOW (ref >90)
Glucose, Bld: 99 mg/dL (ref 70–99)
Potassium: 4.2 mEq/L (ref 3.5–5.1)
Sodium: 138 mEq/L (ref 135–145)

## 2013-09-16 MED ORDER — SODIUM CHLORIDE 0.9 % IJ SOLN
3.0000 mL | INTRAMUSCULAR | Status: DC | PRN
  Administered 2013-09-16: 3 mL via INTRAVENOUS

## 2013-09-16 MED ORDER — SODIUM CHLORIDE 0.9 % IJ SOLN
3.0000 mL | Freq: Two times a day (BID) | INTRAMUSCULAR | Status: DC
  Administered 2013-09-16 – 2013-09-21 (×11): 3 mL via INTRAVENOUS
  Administered 2013-09-22: 10:00:00 via INTRAVENOUS
  Administered 2013-09-22 – 2013-09-29 (×14): 3 mL via INTRAVENOUS

## 2013-09-16 NOTE — Telephone Encounter (Signed)
lmtcb to discuss upcoming CPX instructions for next Tuesday 11/18 at 11:30 am, patient to be here at 11 am.

## 2013-09-16 NOTE — Progress Notes (Signed)
Advanced Heart Failure Rounding Note   Subjective:    [name] is a 72 yo man with an ischemic CM s/p ICD implant, chronic systolic HF (EF 20-25%), paroxysmal VT s/p ablation (09/2011) and repeat (06/2013), CAD, PVD (s/p R fem pop BPG 01/2010; s/p L CFA BPG 05/2002), AAA (s/p repair 04/2002), CAS (s/p L CEA 2008), HTN, arthritis, and TIA.   ECHO 09/2013: EF 20-25%, mild AI, mild MR, LA mildly dilated and RV systolic fx mildly reduced.   Over the past 2 months has experienced multiple episodes of Vtach with ICD shocks.He was last admitted to the hosptial 09/06/13-09/09/13 for recurrent VT s/p ICD shock and Qunidine was started. He was supposed to have a CPX test on the OP side and be seen in the HF clinic how presented back to the ED 09/15/13 with CP and was found to be in Vtach rate of 113, which he was cardioverted successfully.   Denies SOB, orthopnea or CP this am. Micah Flesher for CPX test this am and  Peak VO2 10.9 VE/VCO2 48  Objective:   Weight Range:  Vital Signs:   Temp:  [97.6 F (36.4 C)-98.3 F (36.8 C)] 98.3 F (36.8 C) (11/12 0421) Pulse Rate:  [69-74] 70 (11/12 0700) Resp:  [0-18] 14 (11/12 0700) BP: (96-128)/(59-75) 123/65 mmHg (11/12 0700) SpO2:  [92 %-99 %] 97 % (11/12 0700) Last BM Date: 09/14/13  Weight change: Filed Weights   09/15/13 0403  Weight: 190 lb (86.183 kg)    Intake/Output:   Intake/Output Summary (Last 24 hours) at 09/16/13 0756 Last data filed at 09/16/13 0400  Gross per 24 hour  Intake    720 ml  Output   1900 ml  Net  -1180 ml     Physical Exam: General: Well appearing. No resp difficulty; lying in bed  HEENT: normal; L CEA scar; R IJ swan and sheeth Neck: supple. JVP flat . Carotids 2+ bilat; no bruits. No lymphadenopathy or thryomegaly appreciated.  Cor: PMI nondisplaced. Regular rate & rhythm. No rubs, gallops or murmurs.  Lungs: clear  Abdomen: soft, nontender, nondistended. No hepatosplenomegaly. No bruits or masses. Good bowel  sounds.  Extremities: no cyanosis, clubbing, rash, edema  Neuro: alert & orientedx3, cranial nerves grossly intact. moves all 4 extremities w/o difficulty. Affect pleasant  Telemetry: A paced 80s  Labs: Basic Metabolic Panel:  Recent Labs Lab 09/15/13 0425 09/16/13 0415  NA 141 138  K 5.0 4.2  CL 105 104  CO2 25 25  GLUCOSE 116* 99  BUN 27* 24*  CREATININE 2.01* 1.67*  CALCIUM 9.4 8.6    Liver Function Tests: No results found for this basename: AST, ALT, ALKPHOS, BILITOT, PROT, ALBUMIN,  in the last 168 hours No results found for this basename: LIPASE, AMYLASE,  in the last 168 hours No results found for this basename: AMMONIA,  in the last 168 hours  CBC:  Recent Labs Lab 09/15/13 0425 09/16/13 0415  WBC 5.8 5.7  HGB 12.2* 11.9*  HCT 37.4* 35.5*  MCV 100.5* 98.6  PLT 124* 124*    Cardiac Enzymes:  Recent Labs Lab 09/15/13 0750 09/15/13 1908  TROPONINI <0.30 <0.30    BNP: BNP (last 3 results)  Recent Labs  11/23/12 0901 09/15/13 0424 09/15/13 0750  PROBNP 1891.0* 2704.0* 3038.0*    Imaging: Dg Chest Port 1 View  09/15/2013   CLINICAL DATA:  Chest pain.  EXAM: PORTABLE CHEST - 1 VIEW  COMPARISON:  Chest radiograph performed 09/06/2013  FINDINGS: The lungs  are well-aerated. Pulmonary vascularity is at the upper limits of normal. There is no evidence of focal opacification, pleural effusion or pneumothorax.  The cardiomediastinal silhouette is borderline normal in size. A pacemaker / AICD is noted overlying the left chest wall, with leads ending overlying the right atrium and right ventricle. No acute osseous abnormalities are seen.  IMPRESSION: No acute cardiopulmonary process seen.   Electronically Signed   By: Roanna Raider M.D.   On: 09/15/2013 04:24      Medications:     Scheduled Medications: . amiodarone  400 mg Oral Daily  . aspirin  81 mg Oral Daily  . aspirin EC  81 mg Oral Daily  . atorvastatin  80 mg Oral Daily  . carvedilol   3.125 mg Oral BID WC  . clopidogrel  75 mg Oral Daily  . furosemide  20 mg Oral Daily  . heparin  5,000 Units Subcutaneous Q8H  . levothyroxine  112 mcg Oral QAC breakfast  . quiniDINE gluconate  324 mg Oral Q12H  . ramipril  2.5 mg Oral Daily     Infusions:     PRN Medications:  acetaminophen, nitroGLYCERIN, ondansetron (ZOFRAN) IV   Assessment:   1) Recurrent VTach, s/p ICD shock  --ablation 09/2011 and 06/2013  2) Chronic systolic HF, EF 20-25% (09/2013)  3) Mixed NICM/ICM  4) CAD  --s/p previous inferolateral MI.  --Chronically occluded RCA with L-> R collaterals per cath 1/14  5) PAD, severe  6) HTN  7) LBBB   Plan/Discussion:    Stable overnight. Remains Apaced in the 70s. Went for CPX test this am and VO2 10.9 with VE/VCO2 slope of 48. As we have discussed already with patient he is not a tx candidate d/t age and severe PVD. Will set up for RHC tomorrow to assess hemodynamics. He will likely need a LVAD and will start some of the preliminary work-up while he is here.  Cr decreasing 1.67, will need to keep a close eye on because if it rises too far will not be a LVAD candidate.   Length of Stay: 1   Aundria Rud NP-C 09/16/2013, 7:56 AM  Advanced Heart Failure Team Pager [phone] (M-F; 7a - 4p)  Please contact [facility] for night-coverage after hours (4p -7a ) and weekends on [email or web address]  Patient seen and examined with Ulla Potash, NP. We discussed all aspects of the encounter. I agree with the assessment and plan as stated above.   No further VT. Hemodynamically stable. CPX testing shows severe circulatory limitation. Will begin VAD work-up. Plan RHC tomorrow. Results discussed with him and he is willing to proceed.   Teighlor Korson,MD 6:08 PM

## 2013-09-17 ENCOUNTER — Encounter (HOSPITAL_COMMUNITY)
Admission: EM | Disposition: A | Discharge status: Home Health | Payer: Self-pay | Source: Home / Self Care | Attending: Internal Medicine

## 2013-09-17 DIAGNOSIS — I509 Heart failure, unspecified: Secondary | ICD-10-CM

## 2013-09-17 HISTORY — PX: RIGHT HEART CATHETERIZATION: SHX5447

## 2013-09-17 LAB — URINALYSIS, ROUTINE W REFLEX MICROSCOPIC
Leukocytes, UA: NEGATIVE
Nitrite: NEGATIVE
Specific Gravity, Urine: 1.013 (ref 1.005–1.030)
pH: 6 (ref 5.0–8.0)

## 2013-09-17 LAB — POCT I-STAT 3, VENOUS BLOOD GAS (G3P V)
Acid-base deficit: 3 mmol/L — ABNORMAL HIGH (ref 0.0–2.0)
Acid-base deficit: 3 mmol/L — ABNORMAL HIGH (ref 0.0–2.0)
Bicarbonate: 21.2 mEq/L (ref 20.0–24.0)
Bicarbonate: 21.8 mEq/L (ref 20.0–24.0)
O2 Saturation: 60 %
O2 Saturation: 60 %
O2 Saturation: 61 %
TCO2: 23 mmol/L (ref 0–100)
TCO2: 26 mmol/L (ref 0–100)
pCO2, Ven: 37.5 mmHg — ABNORMAL LOW (ref 45.0–50.0)
pCO2, Ven: 39.8 mmHg — ABNORMAL LOW (ref 45.0–50.0)
pO2, Ven: 32 mmHg (ref 30.0–45.0)

## 2013-09-17 LAB — CREATININE, SERUM
GFR calc Af Amer: 51 mL/min — ABNORMAL LOW (ref >90)
GFR calc non Af Amer: 44 mL/min — ABNORMAL LOW (ref >90)

## 2013-09-17 LAB — CBC
HCT: 36.2 % — ABNORMAL LOW (ref 39.0–52.0)
Hemoglobin: 12.1 g/dL — ABNORMAL LOW (ref 13.0–17.0)
MCV: 98.6 fL (ref 78.0–100.0)
Platelets: 137 10*3/uL — ABNORMAL LOW (ref 150–400)
RBC: 3.67 MIL/uL — ABNORMAL LOW (ref 4.22–5.81)
WBC: 6.1 10*3/uL (ref 4.0–10.5)

## 2013-09-17 LAB — PROTIME-INR: Prothrombin Time: 16.3 seconds — ABNORMAL HIGH (ref 11.6–15.2)

## 2013-09-17 SURGERY — RIGHT HEART CATH
Anesthesia: LOCAL

## 2013-09-17 MED ORDER — SODIUM CHLORIDE 0.9 % IJ SOLN
3.0000 mL | INTRAMUSCULAR | Status: DC | PRN
  Administered 2013-09-17: 3 mL via INTRAVENOUS

## 2013-09-17 MED ORDER — SODIUM CHLORIDE 0.9 % IJ SOLN: 3.0000 mL | Freq: Two times a day (BID) | INTRAMUSCULAR | Status: DC

## 2013-09-17 MED ORDER — HEPARIN (PORCINE) IN NACL 2-0.9 UNIT/ML-% IJ SOLN
INTRAMUSCULAR | Status: AC
  Filled 2013-09-17: qty 500

## 2013-09-17 MED ORDER — SODIUM CHLORIDE 0.9 % IV BOLUS (SEPSIS): 250.0000 mL | Freq: Once | INTRAVENOUS | Status: AC

## 2013-09-17 MED ORDER — SODIUM CHLORIDE 0.9 % IV SOLN
250.0000 mL | INTRAVENOUS | Status: DC | PRN
  Administered 2013-09-17: 250 mL via INTRAVENOUS

## 2013-09-17 MED ORDER — ACETAMINOPHEN 325 MG PO TABS
650.0000 mg | ORAL_TABLET | ORAL | Status: DC | PRN
  Administered 2013-09-21 – 2013-09-24 (×4): 650 mg via ORAL
  Filled 2013-09-17: qty 2

## 2013-09-17 MED ORDER — SODIUM CHLORIDE 0.9 % IJ SOLN
3.0000 mL | Freq: Two times a day (BID) | INTRAMUSCULAR | Status: DC
  Administered 2013-09-17 – 2013-09-19 (×5): 3 mL via INTRAVENOUS

## 2013-09-17 MED ORDER — MIDAZOLAM HCL 2 MG/2ML IJ SOLN
INTRAMUSCULAR | Status: AC
  Filled 2013-09-17: qty 2

## 2013-09-17 MED ORDER — FENTANYL CITRATE 0.05 MG/ML IJ SOLN
INTRAMUSCULAR | Status: AC
  Filled 2013-09-17: qty 2

## 2013-09-17 MED ORDER — HEPARIN SODIUM (PORCINE) 5000 UNIT/ML IJ SOLN
5000.0000 [IU] | Freq: Three times a day (TID) | INTRAMUSCULAR | Status: DC
  Administered 2013-09-17 – 2013-09-29 (×35): 5000 [IU] via SUBCUTANEOUS
  Filled 2013-09-17 (×39): qty 1

## 2013-09-17 MED ORDER — LIDOCAINE HCL (PF) 1 % IJ SOLN
INTRAMUSCULAR | Status: AC
  Filled 2013-09-17: qty 30

## 2013-09-17 MED ORDER — SODIUM CHLORIDE 0.9 % IV SOLN: 250.0000 mL | INTRAVENOUS | Status: DC | PRN

## 2013-09-17 MED ORDER — SODIUM CHLORIDE 0.9 % IJ SOLN: 3.0000 mL | INTRAMUSCULAR | Status: DC | PRN

## 2013-09-17 MED ORDER — ONDANSETRON HCL 4 MG/2ML IJ SOLN: 4.0000 mg | Freq: Four times a day (QID) | INTRAMUSCULAR | Status: DC | PRN

## 2013-09-17 MED ORDER — HEPARIN SODIUM (PORCINE) 5000 UNIT/ML IJ SOLN: 5000.0000 [IU] | Freq: Three times a day (TID) | INTRAMUSCULAR | Status: DC

## 2013-09-17 NOTE — Progress Notes (Signed)
Advanced Heart Failure Rounding Note   Subjective:    Hau Ashline is a 72 yo man with an ischemic CM s/p ICD implant, chronic systolic HF (EF 20-25%), paroxysmal VT s/p ablation (09/2011) and repeat (06/2013), CAD, PVD (s/p R fem pop BPG 01/2010; s/p L CFA BPG 05/2002), AAA (s/p repair 04/2002), CAS (s/p L CEA 2008), HTN, arthritis, and TIA.   ECHO 09/2013: EF 20-25%, mild AI, mild MR, LA mildly dilated and RV systolic fx mildly reduced.   Over the past 2 months has experienced multiple episodes of Vtach with ICD shocks.He was last admitted to the hosptial 09/06/13-09/09/13 for recurrent VT s/p ICD shock and Qunidine was started. He was supposed to have a CPX test on the OP side and be seen in the HF clinic how presented back to the ED 09/15/13 with CP and was found to be in Vtach rate of 113, which he was cardioverted successfully.   CPX 09/16/13 Peak VO2 10.9 VE/VCO2 48 RER 1.11 Ve/MVV 43%  No VT overnight. Feels OK. Just fatigued.    Objective:   Weight Range:  Vital Signs:   Temp:  [97.3 F (36.3 C)-98.2 F (36.8 C)] 98.2 F (36.8 C) (11/13 0501) Pulse Rate:  [67-79] 69 (11/13 0500) Resp:  [11-26] 15 (11/13 0500) BP: (95-131)/(53-84) 129/71 mmHg (11/13 0400) SpO2:  [93 %-99 %] 96 % (11/13 0500) Weight:  [87.1 kg (192 lb 0.3 oz)] 87.1 kg (192 lb 0.3 oz) (11/12 0800) Last BM Date: 09/14/13  Weight change: Filed Weights   09/15/13 0403 09/16/13 0800  Weight: 86.183 kg (190 lb) 87.1 kg (192 lb 0.3 oz)    Intake/Output:   Intake/Output Summary (Last 24 hours) at 09/17/13 0630 Last data filed at 09/17/13 0500  Gross per 24 hour  Intake    966 ml  Output   1050 ml  Net    -84 ml     Physical Exam: General: Well appearing. No resp difficulty; lying in bed  HEENT: normal; L CEA scar Neck: supple. JVP flat . Carotids 2+ bilat; no bruits. No lymphadenopathy or thryomegaly appreciated.  Cor: PMI nondisplaced. Regular rate & rhythm. No rubs, gallops or murmurs.  Lungs:  clear  Abdomen: soft, nontender, nondistended. No hepatosplenomegaly. No bruits or masses. Good bowel sounds.  Extremities: no cyanosis, clubbing, rash, edema  Neuro: alert & orientedx3, cranial nerves grossly intact. moves all 4 extremities w/o difficulty. Affect pleasant  Telemetry: A paced 80s  Labs: Basic Metabolic Panel:  Recent Labs Lab 09/15/13 0425 09/16/13 0415  NA 141 138  K 5.0 4.2  CL 105 104  CO2 25 25  GLUCOSE 116* 99  BUN 27* 24*  CREATININE 2.01* 1.67*  CALCIUM 9.4 8.6    Liver Function Tests: No results found for this basename: AST, ALT, ALKPHOS, BILITOT, PROT, ALBUMIN,  in the last 168 hours No results found for this basename: LIPASE, AMYLASE,  in the last 168 hours No results found for this basename: AMMONIA,  in the last 168 hours  CBC:  Recent Labs Lab 09/15/13 0425 09/16/13 0415  WBC 5.8 5.7  HGB 12.2* 11.9*  HCT 37.4* 35.5*  MCV 100.5* 98.6  PLT 124* 124*    Cardiac Enzymes:  Recent Labs Lab 09/15/13 0750 09/15/13 1908  TROPONINI <0.30 <0.30    BNP: BNP (last 3 results)  Recent Labs  11/23/12 0901 09/15/13 0424 09/15/13 0750  PROBNP 1891.0* 2704.0* 3038.0*    Imaging: No results found.   Medications:       Scheduled Medications: . amiodarone  400 mg Oral Daily  . aspirin  81 mg Oral Daily  . aspirin EC  81 mg Oral Daily  . atorvastatin  80 mg Oral Daily  . carvedilol  3.125 mg Oral BID WC  . clopidogrel  75 mg Oral Daily  . furosemide  20 mg Oral Daily  . heparin  5,000 Units Subcutaneous Q8H  . levothyroxine  112 mcg Oral QAC breakfast  . quiniDINE gluconate  324 mg Oral Q12H  . ramipril  2.5 mg Oral Daily  . sodium chloride  3 mL Intravenous Q12H    Infusions:    PRN Medications: acetaminophen, nitroGLYCERIN, ondansetron (ZOFRAN) IV, sodium chloride   Assessment:   1) Recurrent VTach, s/p ICD shock  --ablation 09/2011 and 06/2013  2) Chronic systolic HF, EF 20-25% (09/2013)  3) Mixed NICM/ICM  4)  CAD  --s/p previous inferolateral MI.  --Chronically occluded RCA with L-> R collaterals per cath 1/14  5) PAD, severe  6) HTN  7) LBBB   Plan/Discussion:    No further VT. Hemodynamically stable. CPX testing shows severe circulatory limitation. Will begin VAD work-up. Plan RHC today. Volume status stable.   Jehu Mccauslin,MD 6:30 AM     

## 2013-09-17 NOTE — Progress Notes (Signed)
Patient c/o feeling faint and light=headed.  BP 74/50.  Patient pasty looking.  States that he feels like he did the other night when he passed out.  Rt. Groin dressing dry and intact and soft.  No c/o of back pain.  Dr. Gala Romney notified of drop in BP.  Placed back in bed.  Order for NS bolus to be given.

## 2013-09-17 NOTE — CV Procedure (Signed)
Cardiac Cath Procedure Note:  Indication:  HF  Procedures performed:  1) Right heart catheterization  Description of procedure:   The risks and indication of the procedure were explained. Consent was signed and placed on the chart. An appropriate timeout was taken prior to the procedure. The right groin was prepped and draped in the routine sterile fashion and anesthetized with 1% local lidocaine.   A 7 FR venous sheath was placed in the right femoral vein using a modified Seldinger technique. A standard Swan-Ganz catheter was used for the procedure.   Complications: None apparent.  Findings:  RA = 4  RV = 42/4/5 PA = 44/22 (31) PCW = 19 v = 24 Fick cardiac output/index = 5.45/2.6 Thermo CO/CI = 3.8/1.8 PVR = 2.2 WU (Fick) FA sat = 92% PA sat = 60%, 60% SVC = 61%  Assessment: 1. Well compensated filling pressures 2. Normal cardiac output by Fick but depressed cardiac output by thermodilution 3. No evidence of intracardiac shunt  Plan/Discussion:  Suspect Fick may be overestimated due to O2 consumption estimation. Proceed with VAD work-up.  Daniel Bensimhon 9:30 AM

## 2013-09-17 NOTE — Interval H&P Note (Signed)
History and Physical Interval Note:  09/17/2013 9:29 AM  [name]  has presented today for surgery, with the diagnosis of hf  The various methods of treatment have been discussed with the patient and family. After consideration of risks, benefits and other options for treatment, the patient has consented to  Procedure(s): RIGHT HEART CATH (N/A) as a surgical intervention .  The patient's history has been reviewed, patient examined, no change in status, stable for surgery.  I have reviewed the patient's chart and labs.  Questions were answered to the patient's satisfaction.     Daniel Bensimhon

## 2013-09-17 NOTE — H&P (View-Only) (Signed)
Advanced Heart Failure Rounding Note   Subjective:    [name] is a 72 yo man with an ischemic CM s/p ICD implant, chronic systolic HF (EF 20-25%), paroxysmal VT s/p ablation (09/2011) and repeat (06/2013), CAD, PVD (s/p R fem pop BPG 01/2010; s/p L CFA BPG 05/2002), AAA (s/p repair 04/2002), CAS (s/p L CEA 2008), HTN, arthritis, and TIA.   ECHO 09/2013: EF 20-25%, mild AI, mild MR, LA mildly dilated and RV systolic fx mildly reduced.   Over the past 2 months has experienced multiple episodes of Vtach with ICD shocks.He was last admitted to the hosptial 09/06/13-09/09/13 for recurrent VT s/p ICD shock and Qunidine was started. He was supposed to have a CPX test on the OP side and be seen in the HF clinic how presented back to the ED 09/15/13 with CP and was found to be in Vtach rate of 113, which he was cardioverted successfully.   CPX 09/16/13 Peak VO2 10.9 VE/VCO2 48 RER 1.11 Ve/MVV 43%  No VT overnight. Feels OK. Just fatigued.    Objective:   Weight Range:  Vital Signs:   Temp:  [97.3 F (36.3 C)-98.2 F (36.8 C)] 98.2 F (36.8 C) (11/13 0501) Pulse Rate:  [67-79] 69 (11/13 0500) Resp:  [11-26] 15 (11/13 0500) BP: (95-131)/(53-84) 129/71 mmHg (11/13 0400) SpO2:  [93 %-99 %] 96 % (11/13 0500) Weight:  [87.1 kg (192 lb 0.3 oz)] 87.1 kg (192 lb 0.3 oz) (11/12 0800) Last BM Date: 09/14/13  Weight change: Filed Weights   09/15/13 0403 09/16/13 0800  Weight: 86.183 kg (190 lb) 87.1 kg (192 lb 0.3 oz)    Intake/Output:   Intake/Output Summary (Last 24 hours) at 09/17/13 0630 Last data filed at 09/17/13 0500  Gross per 24 hour  Intake    966 ml  Output   1050 ml  Net    -84 ml     Physical Exam: General: Well appearing. No resp difficulty; lying in bed  HEENT: normal; L CEA scar Neck: supple. JVP flat . Carotids 2+ bilat; no bruits. No lymphadenopathy or thryomegaly appreciated.  Cor: PMI nondisplaced. Regular rate & rhythm. No rubs, gallops or murmurs.  Lungs:  clear  Abdomen: soft, nontender, nondistended. No hepatosplenomegaly. No bruits or masses. Good bowel sounds.  Extremities: no cyanosis, clubbing, rash, edema  Neuro: alert & orientedx3, cranial nerves grossly intact. moves all 4 extremities w/o difficulty. Affect pleasant  Telemetry: A paced 80s  Labs: Basic Metabolic Panel:  Recent Labs Lab 09/15/13 0425 09/16/13 0415  NA 141 138  K 5.0 4.2  CL 105 104  CO2 25 25  GLUCOSE 116* 99  BUN 27* 24*  CREATININE 2.01* 1.67*  CALCIUM 9.4 8.6    Liver Function Tests: No results found for this basename: AST, ALT, ALKPHOS, BILITOT, PROT, ALBUMIN,  in the last 168 hours No results found for this basename: LIPASE, AMYLASE,  in the last 168 hours No results found for this basename: AMMONIA,  in the last 168 hours  CBC:  Recent Labs Lab 09/15/13 0425 09/16/13 0415  WBC 5.8 5.7  HGB 12.2* 11.9*  HCT 37.4* 35.5*  MCV 100.5* 98.6  PLT 124* 124*    Cardiac Enzymes:  Recent Labs Lab 09/15/13 0750 09/15/13 1908  TROPONINI <0.30 <0.30    BNP: BNP (last 3 results)  Recent Labs  11/23/12 0901 09/15/13 0424 09/15/13 0750  PROBNP 1891.0* 2704.0* 3038.0*    Imaging: No results found.   Medications:  Scheduled Medications: . amiodarone  400 mg Oral Daily  . aspirin  81 mg Oral Daily  . aspirin EC  81 mg Oral Daily  . atorvastatin  80 mg Oral Daily  . carvedilol  3.125 mg Oral BID WC  . clopidogrel  75 mg Oral Daily  . furosemide  20 mg Oral Daily  . heparin  5,000 Units Subcutaneous Q8H  . levothyroxine  112 mcg Oral QAC breakfast  . quiniDINE gluconate  324 mg Oral Q12H  . ramipril  2.5 mg Oral Daily  . sodium chloride  3 mL Intravenous Q12H    Infusions:    PRN Medications: acetaminophen, nitroGLYCERIN, ondansetron (ZOFRAN) IV, sodium chloride   Assessment:   1) Recurrent VTach, s/p ICD shock  --ablation 09/2011 and 06/2013  2) Chronic systolic HF, EF 20-25% (09/2013)  3) Mixed NICM/ICM  4)  CAD  --s/p previous inferolateral MI.  --Chronically occluded RCA with L-> R collaterals per cath 1/14  5) PAD, severe  6) HTN  7) LBBB   Plan/Discussion:    No further VT. Hemodynamically stable. CPX testing shows severe circulatory limitation. Will begin VAD work-up. Plan RHC today. Volume status stable.   [name] Printy,MD 6:30 AM

## 2013-09-18 ENCOUNTER — Other Ambulatory Visit: Payer: Self-pay

## 2013-09-18 ENCOUNTER — Inpatient Hospital Stay (HOSPITAL_COMMUNITY): Payer: Medicare Other

## 2013-09-18 ENCOUNTER — Encounter (HOSPITAL_COMMUNITY): Payer: Self-pay | Admitting: Radiology

## 2013-09-18 LAB — PULMONARY FUNCTION TEST
DL/VA % pred: 43 %
DL/VA: 2.04 ml/min/mmHg/L
DLCO cor % pred: 36 %
DLCO cor: 12.63 ml/min/mmHg
FEF 25-75 Pre: 1.78 L/sec
FEF2575-%Pred-Pre: 72 %
FEV1-Pre: 2.92 L
FEV6-%Pred-Pre: 97 %
FEV6-Pre: 4.25 L
FEV6FVC-%Pred-Pre: 104 %
FVC-%Pred-Pre: 93 %
FVC-Pre: 4.33 L
Pre FEV6/FVC Ratio: 98 %
RV % pred: 103 %
RV: 2.73 L
TLC % pred: 101 %

## 2013-09-18 LAB — HEPATITIS B SURFACE ANTIGEN: Hepatitis B Surface Ag: NEGATIVE

## 2013-09-18 LAB — POCT I-STAT, CHEM 8
Calcium, Ion: 1.13 mmol/L (ref 1.13–1.30)
Chloride: 106 mEq/L (ref 96–112)
HCT: 37 % — ABNORMAL LOW (ref 39.0–52.0)
Potassium: 4.1 mEq/L (ref 3.5–5.1)

## 2013-09-18 LAB — HEMOGLOBIN A1C
Hgb A1c MFr Bld: 6 % — ABNORMAL HIGH (ref <5.7)
Mean Plasma Glucose: 126 mg/dL — ABNORMAL HIGH (ref <117)

## 2013-09-18 LAB — HIV ANTIBODY (ROUTINE TESTING W REFLEX): HIV: NONREACTIVE

## 2013-09-18 LAB — POCT I-STAT 3, ART BLOOD GAS (G3+)
Acid-Base Excess: 1 mmol/L (ref 0.0–2.0)
Bicarbonate: 23.6 mEq/L (ref 20.0–24.0)
Patient temperature: 98.6
pH, Arterial: 7.522 — ABNORMAL HIGH (ref 7.350–7.450)
pO2, Arterial: 70 mmHg — ABNORMAL LOW (ref 80.0–100.0)

## 2013-09-18 LAB — PSA: PSA: 0.31 ng/mL (ref <=4.00)

## 2013-09-18 LAB — ANTITHROMBIN III: AntiThromb III Func: 72 % — ABNORMAL LOW (ref 75–120)

## 2013-09-18 LAB — HEPATITIS B SURFACE ANTIBODY,QUALITATIVE: Hep B S Ab: NEGATIVE

## 2013-09-18 LAB — HEPATITIS B CORE ANTIBODY, IGM: Hep B C IgM: NONREACTIVE

## 2013-09-18 MED ORDER — CARVEDILOL 6.25 MG PO TABS
6.2500 mg | ORAL_TABLET | Freq: Two times a day (BID) | ORAL | Status: DC
  Administered 2013-09-18 – 2013-09-19 (×2): 6.25 mg via ORAL
  Filled 2013-09-18 (×4): qty 1

## 2013-09-18 NOTE — Progress Notes (Signed)
Advanced Heart Failure Rounding Note   Subjective:    [name] is a 72 yo man with an ischemic CM s/p ICD implant, chronic systolic HF (EF 20-25%), paroxysmal VT s/p ablation (09/2011) and repeat (06/2013), CAD, PVD (s/p R fem pop BPG 01/2010; s/p L CFA BPG 05/2002), AAA (s/p repair 04/2002), CAS (s/p L CEA 2008), HTN, arthritis, and TIA.   ECHO 09/2013: EF 20-25%, mild AI, mild MR, LA mildly dilated and RV systolic fx mildly reduced.   Over the past 2 months has experienced multiple episodes of Vtach with ICD shocks.He was last admitted to the hosptial 09/06/13-09/09/13 for recurrent VT s/p ICD shock and Qunidine was started. He was supposed to have a CPX test on the OP side and be seen in the HF clinic how presented back to the ED 09/15/13 with CP and was found to be in Vtach rate of 113, which he was cardioverted successfully.   CPX 09/16/13 Peak VO2 10.9 VE/VCO2 48 RER 1.11 Ve/MVV 43%  RHC results reviewed. Feels ok. Just fatigued. No dyspnea or orthopnea. EP following VT.    Objective:   Weight Range:  Vital Signs:   Temp:  [97.9 F (36.6 C)-98.9 F (37.2 C)] 98.3 F (36.8 C) (11/14 1146) Pulse Rate:  [67-87] 69 (11/14 1100) Resp:  [11-19] 17 (11/14 1100) BP: (68-136)/(41-73) 101/62 mmHg (11/14 1100) SpO2:  [89 %-99 %] 96 % (11/14 1100) Last BM Date: 09/14/13  Weight change: Filed Weights   09/15/13 0403 09/16/13 0800 09/17/13 0600  Weight: 86.183 kg (190 lb) 87.1 kg (192 lb 0.3 oz) 87.3 kg (192 lb 7.4 oz)    Intake/Output:   Intake/Output Summary (Last 24 hours) at 09/18/13 1309 Last data filed at 09/18/13 1000  Gross per 24 hour  Intake    300 ml  Output   1600 ml  Net  -1300 ml     Physical Exam: General: Well appearing. No resp difficulty; lying in bed  HEENT: normal; L CEA scar Neck: supple. JVP flat . Carotids 2+ bilat; no bruits. No lymphadenopathy or thryomegaly appreciated.  Cor: PMI nondisplaced. Regular rate & rhythm. No rubs, gallops or murmurs.   Lungs: clear  Abdomen: soft, nontender, nondistended. No hepatosplenomegaly. No bruits or masses. Good bowel sounds.  Extremities: no cyanosis, clubbing, rash, edema  Neuro: alert & orientedx3, cranial nerves grossly intact. moves all 4 extremities w/o difficulty. Affect pleasant  Telemetry: A paced 80s  Labs: Basic Metabolic Panel:  Recent Labs Lab 09/15/13 0425 09/16/13 0415 09/17/13 1335  NA 141 138  --   K 5.0 4.2  --   CL 105 104  --   CO2 25 25  --   GLUCOSE 116* 99  --   BUN 27* 24*  --   CREATININE 2.01* 1.67* 1.52*  CALCIUM 9.4 8.6  --     Liver Function Tests: No results found for this basename: AST, ALT, ALKPHOS, BILITOT, PROT, ALBUMIN,  in the last 168 hours No results found for this basename: LIPASE, AMYLASE,  in the last 168 hours No results found for this basename: AMMONIA,  in the last 168 hours  CBC:  Recent Labs Lab 09/15/13 0425 09/16/13 0415 09/17/13 1335  WBC 5.8 5.7 6.1  HGB 12.2* 11.9* 12.1*  HCT 37.4* 35.5* 36.2*  MCV 100.5* 98.6 98.6  PLT 124* 124* 137*    Cardiac Enzymes:  Recent Labs Lab 09/15/13 0750 09/15/13 1908  TROPONINI <0.30 <0.30    BNP: BNP (last 3 results)  Recent Labs  11/23/12 0901 09/15/13 0424 09/15/13 0750  PROBNP 1891.0* 2704.0* 3038.0*    Imaging: No results found.   Medications:     Scheduled Medications: . amiodarone  400 mg Oral Daily  . aspirin  81 mg Oral Daily  . aspirin EC  81 mg Oral Daily  . atorvastatin  80 mg Oral Daily  . carvedilol  6.25 mg Oral BID WC  . clopidogrel  75 mg Oral Daily  . furosemide  20 mg Oral Daily  . heparin  5,000 Units Subcutaneous Q8H  . levothyroxine  112 mcg Oral QAC breakfast  . quiniDINE gluconate  324 mg Oral Q12H  . ramipril  2.5 mg Oral Daily  . sodium chloride  3 mL Intravenous Q12H  . sodium chloride  3 mL Intravenous Q12H    Infusions:    PRN Medications: sodium chloride, acetaminophen, acetaminophen, nitroGLYCERIN, ondansetron (ZOFRAN)  IV, sodium chloride, sodium chloride   Assessment:   1) Recurrent VTach, s/p ICD shock  --ablation 09/2011 and 06/2013  2) Chronic systolic HF, EF 20-25% (09/2013)  3) Mixed NICM/ICM  4) CAD  --s/p previous inferolateral MI.  --Chronically occluded RCA with L-> R collaterals per cath 1/14  5) PAD, severe  6) HTN  7) LBBB   Plan/Discussion:    Long talk about results of RHC and CPX. He is interested in proceeding with VAD as destination therapy. I think he is a good candidate. Will await final recs from EP and then plan to send home tomorrow with possible VAD placement in 2 weeks.   HF otherwise stable. Continue low dose lasix  Daniel Bensimhon,MD 1:09 PM

## 2013-09-18 NOTE — Telephone Encounter (Signed)
CPX cancelled - patient scheduled for LVAD implant

## 2013-09-18 NOTE — Code Documentation (Cosign Needed)
CODE BLUE NOTE  Patient Name: [name]   MRN: [id number]   Date of Birth/ Sex: [birth year] , male      Admission Date: 09/15/2013  Attending Provider: Duke Salvia, MD  Primary Diagnosis: <principal problem not specified>    Indication: Pt was in his usual state of health until about 14:30 when he was noted to be cyanotic and not breathing while having PFTs performed, for a period of 3 minutes by report. Code blue was subsequently called. At the time of arrival on scene, patient was seated and having active emesis of stomach contents. SBP in 60s, HR 70s with palpable pulse, paced rhythm on monitor. Oxygen by non-rebreather was administered, pt was maintaining airway, with improvement in cyanosis. He is admitted with diagnosis of syncope, and has a history of paroxysmal VTach with ICD. He reports not having felt a shock at this time, but has felt it in the past. He was responsive, able to assist with transfer to bed, and taken back to the SICU.    Technical Description:  - CPR performance duration:  Was not performed  - Was defibrillation or cardioversion used? No   - Was external pacer placed? No  - Was patient intubated pre/post CPR? No    Medications Administered: Y = Yes; Blank = No Amiodarone    Atropine    Calcium    Epinephrine    Lidocaine    Magnesium    Norepinephrine    Phenylephrine    Sodium bicarbonate    Vasopressin      Post CPR evaluation:  - Final Status - Was patient successfully resuscitated ? Yes - What is current rhythm? Paced around 74 bpm - What is current hemodynamic status? Stable   Miscellaneous Information:  - Labs sent, including: iStat Chem 8, Lactate, ECG 12-lead, Troponin, ABG  - Primary team notified?  Yes, Dr. Gala Romney  - Family Notified? No  - Additional notes/ transfer status: Transferred back to SICU, hemodynamically stable.         Hazeline Junker, MD  09/18/2013, 3:21 PM

## 2013-09-18 NOTE — Progress Notes (Signed)
Pt returned to SICU at 1500, report called by MD Internal medicine Desma Maxim. Pt had episode of dizziness, light-headed,  SBP-70s, Blurred vision, HR 70s, one episode of vomiting, no loss of consciousness reported. MD Bensimhon notified. Pt is now on ra- sats 100%, BP 110/59, EKG completed. Pt alert and oriented. Will cont to monitor pt. Tapanga Ottaway L

## 2013-09-18 NOTE — Progress Notes (Signed)
MEDICATION RELATED CONSULT NOTE    Pharmacy Consult for medication review for LVAD work-up   Allergies  Allergen Reactions  . Meperidine Hcl Other (See Comments)    "CAN'T MOVE" CATATONIC PER PT.  [name] Opium Other (See Comments)    "CAN'T MOVE" CATATONIC PER PT.    Patient Measurements: Height: 6' (182.9 cm) Weight: 192 lb 7.4 oz (87.3 kg) IBW/kg (Calculated) : 77.6  Vital Signs: Temp: 98.3 F (36.8 C) (11/14 1146) Temp src: Oral (11/14 1146) BP: 101/62 mmHg (11/14 1100) Pulse Rate: 69 (11/14 1100) Intake/Output from previous day: 11/13 0701 - 11/14 0700 In: 243 [P.O.:240; I.V.:3] Out: 1450 [Urine:1450] Intake/Output from this shift: Total I/O In: 300 [P.O.:300] Out: 450 [Urine:450]  Labs:  Recent Labs  09/16/13 0415 09/17/13 1335  WBC 5.7 6.1  HGB 11.9* 12.1*  HCT 35.5* 36.2*  PLT 124* 137*  CREATININE 1.67* 1.52*   Estimated Creatinine Clearance: 47.5 ml/min (by C-G formula based on Cr of 1.52).   Microbiology: Recent Results (from the past 720 hour(s))  MRSA PCR SCREENING     Status: None   Collection Time    09/15/13  7:12 AM      Result Value Range Status   MRSA by PCR NEGATIVE  NEGATIVE Final   Comment:            The GeneXpert MRSA Assay (FDA     approved for NASAL specimens     only), is one component of a     comprehensive MRSA colonization     surveillance program. It is not     intended to diagnose MRSA     infection nor to guide or     monitor treatment for     MRSA infections.    Medical History: Past Medical History  Diagnosis Date  . CAD (coronary artery disease)     s/p MI 1982;  s/p DES to CFX 2011;  s/p NSTEMI 4/12 in setting of VTach;  cath 7/12:  LM ok, LAD mid 30-40%, Dx's with 40%, mCFX stent patent, prox to mid RCA occluded with R to R and L to R collats.  Medical Tx was continued  . Bradycardia   . Ventricular tachycardia     s/p AICD;   h/o ICD shock;  Amiodarone Rx. S/P ablation in 2012  . PVD (peripheral vascular  disease)     h/o claudication;  s/p R fem to pop BPG 3/11;  s/p L CFA BPG 7/03;  s/p repair of inf AAA 6/03;  s/p aorta to bilat renal BPG 6/03  . HTN (hypertension)   . HLD (hyperlipidemia)   . Cytopenia   . Hypothyroidism   . Renal insufficiency   . Claudication   . Chronic systolic heart failure   . Ischemic cardiomyopathy     echo 7/12:  EF 25-30%, mild AI, mod MR, mod LAE  . TIA (transient ischemic attack) 2001  . CVD (cerebrovascular disease)     left CEA 2008  . Stroke   . Anemia   . Inappropriate therapy from implantable cardioverter-defibrillator 04/02/2013    ATP for sinus tach  SVT wavelet identified SVT but was no  passive   . Myocardial infarction 1992  . Anginal pain   . Automatic implantable cardioverter-defibrillator in situ     [employer] Evera device serial number [id number]    . Sleep apnea     denies wearing mask (05/25/2013)  . Arthritis     "a little in my knees" (05/25/2013)  .  Melanoma of ear 2013    "near left ear" (05/25/2013)    Medications:  Prescriptions prior to admission  Medication Sig Dispense Refill  . amiodarone (PACERONE) 200 MG tablet Take 2 tablets (400 mg total) by mouth daily.  30 tablet  4  . aspirin 81 MG tablet Take 81 mg by mouth daily.       [name] atorvastatin (LIPITOR) 80 MG tablet Take 80 mg by mouth daily.       [name] CALCIUM-MAGNESIUM-ZINC PO Take 3 tablets by mouth at bedtime. 1000 mg calcium, 400 mg magnesium, 25 mg zinc      . carvedilol (COREG) 6.25 MG tablet Take 0.5 tablets (3.125 mg total) by mouth 2 (two) times daily with a meal.  60 tablet  4  . clopidogrel (PLAVIX) 75 MG tablet Take 75 mg by mouth daily.       . fexofenadine (ALLEGRA) 180 MG tablet Take 180 mg by mouth daily as needed (for allergies).      . furosemide (LASIX) 20 MG tablet Take 20 mg by mouth daily.   1 tablet    . levothyroxine (SYNTHROID) 112 MCG tablet Take 1 tablet (112 mcg total) by mouth daily.      . Multiple Vitamin (MULTIVITAMIN) tablet Take 1 tablet by  mouth daily.       . Multiple Vitamins-Minerals (EYE VITAMINS PO) Take 1 capsule by mouth 2 (two) times daily.       . nitroGLYCERIN (NITROSTAT) 0.4 MG SL tablet Place 0.4 mg under the tongue every 5 (five) minutes as needed for chest pain. For chest pain      . Omega-3 Fatty Acids (FISH OIL) 1200 MG CAPS Take 1 capsule by mouth daily.      [name] quiniDINE gluconate 324 MG CR tablet Take 1 tablet (324 mg total) by mouth every 12 (twelve) hours.  60 tablet  4  . ramipril (ALTACE) 2.5 MG capsule Take 1 capsule (2.5 mg total) by mouth daily.  30 capsule  3    Assessment: 72 year old man with an ischemic CM s/p ICD implant, chronic systolic HF (EF 20-25%), paroxysmal VT s/p ablation (09/2011) and repeat (06/2013), CAD, and extensive PVD. Patient seen with heart failure team for possible LVAD and asked for medication review.   His home medications have been reordered except for nonessential herbals.   Of note patient is on amiodarone and quinidine for his recurrent VT. QTc has remained stable and he is followed closely as outpatient by EP cardiology. No significant drug interactions are noted with the amiodarone. He is not currently on any chronic anticoagulation but would be placed on warfarin post - LVAD, would follow INR closely with coadministration with amiodarone.  Patient is on dual antiplatelet therapy for CAD/PVD/CVD, this will need to be reconsidered post-surgery when warfarin is initiated.  He does NOT have documented afib, only ventricular arr, but calculated CHA2DS2-VASc = 6 indicated a very high pump thrombosis risk.   He also has a calculated HAS-BLED score of 4. Giving a bleeding risk of 8.9% in one validation study and 8.70 bleeds per 100 patient-years in another validation study. Indicating he is also a very high bleeding risk.  Plan:  No immediate recommendations. Patient will need close anticoagulation monitoring as he is high risk for thrombosis as well as bleeding. He will likely  need his plavix stopped and continue on asa/warfarin.  Sheppard Coil PharmD., BCPS Clinical Pharmacist Pager [phone] 09/18/2013 2:04 PM

## 2013-09-18 NOTE — Progress Notes (Signed)
INITIAL NUTRITION ASSESSMENT  DOCUMENTATION CODES Per approved criteria  -Not Applicable   INTERVENTION: No nutrition intervention warranted at this time  RD to follow for nutrition care plan  NUTRITION DIAGNOSIS: No nutrition diagnosis at this time  Goal: Pt to meet >/= 90% of their estimated nutrition needs   Monitor:  PO & supplemental intake, weight, labs, I/O's  Reason for Assessment: Consult  72 y.o. male  Admitting Dx: chest pain  ASSESSMENT: Patient with PMH of CAD, HTN, chronic systolic HF and stroke; presented with acute onset of chest pain; episode began while patient was asleep early AM of 11/11 and persisted for several hours; in ED was tachycardic.   Patient s/p procedure 11/13: RIGHT HEART CATHETERIZATION  RD consult for pre-surgical nutrition evaluation.  Patient scheduled for LVAD implantation at beginning of December 2014. Patient is well nourished; reports a very good appetite with no recent unintentional weight loss.  PO intake 100% with meals here per flowsheet records.  Height: Ht Readings from Last 1 Encounters:  09/15/13 6' (1.829 m)    Weight: Wt Readings from Last 1 Encounters:  09/17/13 192 lb 7.4 oz (87.3 kg)    Ideal Body Weight: 178 lb  % Ideal Body Weight: 108%  Wt Readings from Last 10 Encounters:  09/17/13 192 lb 7.4 oz (87.3 kg)  09/17/13 192 lb 7.4 oz (87.3 kg)  09/09/13 187 lb 14.4 oz (85.231 kg)  08/27/13 195 lb 6.4 oz (88.633 kg)  07/28/13 196 lb 1.9 oz (88.959 kg)  06/18/13 199 lb 12.8 oz (90.629 kg)  06/09/13 206 lb 5.6 oz (93.6 kg)  06/09/13 206 lb 5.6 oz (93.6 kg)  06/09/13 206 lb 5.6 oz (93.6 kg)  05/25/13 201 lb (91.173 kg)    Usual Body Weight: 195 lb  % Usual Body Weight: 98%  BMI:  Body mass index is 26.1 kg/(m^2).  Estimated Nutritional Needs: Kcal: 2100-2300 Protein: 110-120 gm Fluid: 2.1-2.3 L  Skin: Intact  Diet Order: Cardiac  EDUCATION NEEDS: -No education needs identified at this  time   Intake/Output Summary (Last 24 hours) at 09/18/13 1142 Last data filed at 09/18/13 1000  Gross per 24 hour  Intake    543 ml  Output   1900 ml  Net  -1357 ml     Labs:   Recent Labs Lab 09/15/13 0425 09/16/13 0415 09/17/13 1335  NA 141 138  --   K 5.0 4.2  --   CL 105 104  --   CO2 25 25  --   BUN 27* 24*  --   CREATININE 2.01* 1.67* 1.52*  CALCIUM 9.4 8.6  --   GLUCOSE 116* 99  --     Scheduled Meds: . amiodarone  400 mg Oral Daily  . aspirin  81 mg Oral Daily  . aspirin EC  81 mg Oral Daily  . atorvastatin  80 mg Oral Daily  . carvedilol  6.25 mg Oral BID WC  . clopidogrel  75 mg Oral Daily  . furosemide  20 mg Oral Daily  . heparin  5,000 Units Subcutaneous Q8H  . levothyroxine  112 mcg Oral QAC breakfast  . quiniDINE gluconate  324 mg Oral Q12H  . ramipril  2.5 mg Oral Daily  . sodium chloride  3 mL Intravenous Q12H  . sodium chloride  3 mL Intravenous Q12H    Continuous Infusions:   Past Medical History  Diagnosis Date  . CAD (coronary artery disease)     s/p MI 1982;  s/p DES to CFX 2011;  s/p NSTEMI 4/12 in setting of VTach;  cath 7/12:  LM ok, LAD mid 30-40%, Dx's with 40%, mCFX stent patent, prox to mid RCA occluded with R to R and L to R collats.  Medical Tx was continued  . Bradycardia   . Ventricular tachycardia     s/p AICD;   h/o ICD shock;  Amiodarone Rx. S/P ablation in 2012  . 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 HTN (hypertension)   . HLD (hyperlipidemia)   . Cytopenia   . Hypothyroidism   . Renal insufficiency   . Claudication   . Chronic systolic heart failure   . Ischemic cardiomyopathy     echo 7/12:  EF 25-30%, mild AI, mod MR, mod LAE  . TIA (transient ischemic attack) 2001  . CVD (cerebrovascular disease)     left CEA 2008  . Stroke   . Anemia   . Inappropriate therapy from  implantable cardioverter-defibrillator 04/02/2013    ATP for sinus tach  SVT wavelet identified SVT but was no  passive   . Myocardial infarction 1992  . Anginal pain   . Automatic implantable cardioverter-defibrillator in situ     Medtronic Evera device serial number [id number]    . Sleep apnea     denies wearing mask (05/25/2013)  . Arthritis     "a little in my knees" (05/25/2013)  . Melanoma of ear 2013    "near left ear" (05/25/2013)    Past Surgical History  Procedure Laterality Date  . Femoral-popliteal bypass graft Right 2011  . Cardiac defibrillator placement  2003; 2005; 05/25/2013    2014: Medtronic Evera device serial number [id number]  . Revascularization / in-situ graft leg    . Renal artery bypass Bilateral 2003  . Back surgery    . Lumbar disc surgery  1974; 2000's  . Knee arthroscopy Bilateral 1990's  . Elbow arthroscopy Right 1990's  . Cholecystectomy  12/15/?2010  . Lipoma excision Left 2013    "near ear" (05/25/2013)  . Heel spur surgery Right 1990's  . Cataract extraction w/ intraocular lens  implant, bilateral Bilateral 2000  . Carotid endarterectomy Left ~ 2007  . Doppler echocardiography  2011  . Tonsillectomy  1946  . Femoral-popliteal bypass graft Left 05/2002    Hattie Perch 05/06/2002 (05/25/2013)  . Tumor excision Left 1960's    "fatty tumor" (05/25/2013)  . Cardiac electrophysiology study and ablation  2012  . Coronary angioplasty  1992  . Cardiac catheterization      "several" (05/25/2013)  . Coronary angioplasty with stent placement      "last one was 11/2012  (05/25/2013)    Maureen Chatters, RD, LDN Pager #: [phone] After-Hours Pager #: [phone]

## 2013-09-18 NOTE — Progress Notes (Signed)
Called by Respiratory staff to evaluate patient in PFT lab.  Patient was unresponsive, and cyanotic.  Patient on monitor, HR 70's, thready, bp 63/40.  Code Blue called,  Patient vomiting, started to moan.  NRB mask applied.  Patient started to talk to [place], denied CP or SOB, color returning. Patient assisted back to bed. Patient brought back to 2308 via bed, monitor and oxygen.  RT called and updated RN.

## 2013-09-18 NOTE — Progress Notes (Signed)
Pt called RN to bedside complaining of chest/epi gastric pain and short of breath.  Pt states it is the same type of pain he felt that brought him to the hospital this visit.  Pt stated that SL nitro was given prior and that has helped reduce pain.    2l [place] O2 placed and SL Nitro x1 given at 0455 BP 111/61 pain 4/10  O2 sat 96%.  0500 BP 89/58 with pain 2/10  0505 BP 91/53  With pain 2/10  Pt states he still feels lightheaded and dizzy.  EKG at bedside.  Consistent with previous EKG.  0520 BP 108/60 with pain 0/10.  Pt states he feels better.  Will continue to monitor.

## 2013-09-18 NOTE — Progress Notes (Signed)
  VAD evaluation consent reviewed and signed by pt. Initial VAD teaching completed with pt and significant other. VAD teaching packet left at bedside for their review. Patient and caregiver asked questions and had good interaction with VAD coordinator; all questions answered regarding VAD implant, hospital stay, and what to expect when discharged home. Pt identified his significant other as primary caregiver. Explained need for 24/7 care when pt discharged home;  both pt and caregiver verbalized understanding of above.   Explained that LVAD can be implanted for two indications:  1. Bridge to transplant - used for patients who cannot safely wait for heart transplant.  2. Destination therapy - used for patients until end of life or recovery of heart function.  Patient and caregiver acknowledge understanding that if patient were to proceed to VAD implant, he would be getting as destination therapy (until end of life) because of his advanced age.  Both agreed to meet with current VAD patient for additional education and questions. VAD patient has agreed to visit with Mr. Bartoli Saturday 09/19/13 morning.

## 2013-09-18 NOTE — Progress Notes (Signed)
SUBJECTIVE: The patient is doing well today.  At this time, he denies chest pain, shortness of breath, or any new concerns.  He has had some recurrent shortness of breath and epigastric pain, but no further VT.  He is very weak with ambulation. Plan for LVAD in 2 weeks.  EP has been asked to make recommendations for AAD therapy.   CURRENT MEDICATIONS: . amiodarone  400 mg Oral Daily  . aspirin  81 mg Oral Daily  . aspirin EC  81 mg Oral Daily  . atorvastatin  80 mg Oral Daily  . carvedilol  3.125 mg Oral BID WC  . clopidogrel  75 mg Oral Daily  . furosemide  20 mg Oral Daily  . heparin  5,000 Units Subcutaneous Q8H  . levothyroxine  112 mcg Oral QAC breakfast  . quiniDINE gluconate  324 mg Oral Q12H  . ramipril  2.5 mg Oral Daily  . sodium chloride  3 mL Intravenous Q12H  . sodium chloride  3 mL Intravenous Q12H      OBJECTIVE: Physical Exam: Filed Vitals:   09/18/13 0520 09/18/13 0600 09/18/13 0700 09/18/13 0800  BP: 108/60 119/65 116/65 106/63  Pulse: 70 70 70 70  Temp:      TempSrc:      Resp: 11 12 11 14   Height:      Weight:      SpO2: 95% 96% 95% 93%    Intake/Output Summary (Last 24 hours) at 09/18/13 0838 Last data filed at 09/18/13 0800  Gross per 24 hour  Intake    303 ml  Output   1450 ml  Net  -1147 ml    Telemetry reveals atrial pacing with intrinsic ventricular conduction, no further VT  GEN- The patient is well appearing, alert and oriented x 3 today.   Head- normocephalic, atraumatic Eyes-  Sclera clear, conjunctiva pink Ears- hearing intact Oropharynx- clear Neck- supple,  Lungs- decreased BS throughout, normal work of breathing Heart- Regular rate and rhythm  GI- soft, NT, ND, + BS Extremities- no clubbing, cyanosis, or edema Skin- no rash or lesion Psych- euthymic mood, full affect Neuro- strength and sensation are intact  LABS: Basic Metabolic Panel:  Recent Labs  29/52/84 0415 09/17/13 1335  NA 138  --   K 4.2  --   CL 104   --   CO2 25  --   GLUCOSE 99  --   BUN 24*  --   CREATININE 1.67* 1.52*  CALCIUM 8.6  --    CBC:  Recent Labs  09/16/13 0415 09/17/13 1335  WBC 5.7 6.1  HGB 11.9* 12.1*  HCT 35.5* 36.2*  MCV 98.6 98.6  PLT 124* 137*   Cardiac Enzymes:  Recent Labs  09/15/13 1908  TROPONINI <0.30    RADIOLOGY: Dg Chest Port 1 View  09/15/2013   CLINICAL DATA:  Chest pain.  EXAM: PORTABLE CHEST - 1 VIEW  COMPARISON:  Chest radiograph performed 09/06/2013  FINDINGS: The lungs are well-aerated. Pulmonary vascularity is at the upper limits of normal. There is no evidence of focal opacification, pleural effusion or pneumothorax.  The cardiomediastinal silhouette is borderline normal in size. A pacemaker / AICD is noted overlying the left chest wall, with leads ending overlying the right atrium and right ventricle. No acute osseous abnormalities are seen.  IMPRESSION: No acute cardiopulmonary process seen.   Electronically Signed   By: Roanna Raider M.D.   On: 09/15/2013 04:24   Dg Chest [facility] [address]  09/06/2013   CLINICAL DATA:  Syncope. Shortness of breath. Emphysema.  EXAM: PORTABLE CHEST - 1 VIEW  COMPARISON:  07/13/2013  FINDINGS: Heart size remains at the upper limits of normal. Dual lead transvenous pacemaker remains in appropriate position. Both lungs are clear. No evidence of pleural effusion. No mass or lymphadenopathy identified.  IMPRESSION: Stable borderline cardiomegaly. No active lung disease.   Electronically Signed   By: Myles Rosenthal M.D.   On: 09/06/2013 14:53    ASSESSMENT AND PLAN:   1. VT He is doing well with current therapy.  I have consulted with Dr Gala Romney.  We will try to increase coreg to 6.25mg  BID. Continue amiodarone 400mg  daily and quinidine 324mg  BID. I have lowered VT zone to 111 bpm with ATP only up to 200 BPM.  His max sensing heart rate is limited to 100 bpm because of this.  2. Ischemic CM/ acute on chronic systolic dysfunction Plan for LVAD in 2weeks is  noted.

## 2013-09-19 MED ORDER — VANCOMYCIN HCL IN DEXTROSE 750-5 MG/150ML-% IV SOLN
750.0000 mg | Freq: Two times a day (BID) | INTRAVENOUS | Status: DC
  Administered 2013-09-19 – 2013-09-22 (×6): 750 mg via INTRAVENOUS
  Filled 2013-09-19 (×8): qty 150

## 2013-09-19 MED ORDER — CARVEDILOL 3.125 MG PO TABS
3.1250 mg | ORAL_TABLET | Freq: Two times a day (BID) | ORAL | Status: DC
  Administered 2013-09-19 – 2013-09-20 (×3): 3.125 mg via ORAL
  Filled 2013-09-19 (×6): qty 1

## 2013-09-19 MED ORDER — DEXTROSE 5 % IV SOLN
1.0000 g | INTRAVENOUS | Status: DC
  Administered 2013-09-19 – 2013-09-20 (×2): 1 g via INTRAVENOUS
  Filled 2013-09-19 (×3): qty 10

## 2013-09-19 MED ORDER — SODIUM CHLORIDE 0.9 % IV SOLN
Freq: Once | INTRAVENOUS | Status: AC
  Administered 2013-09-19: 14:00:00 via INTRAVENOUS

## 2013-09-19 NOTE — Progress Notes (Signed)
Pt c/o dizziness, SOB, blurred vision, and hypotensive SBP 60-70s. Chair laid flat, O2 applied, PA [facility] at bedside. NS bolus ordered, given. Will cont to monitor pt. [name]

## 2013-09-19 NOTE — Progress Notes (Signed)
Advanced Heart Failure Rounding Note   Subjective:    [name] is a 72 yo man with an ischemic CM s/p ICD implant, chronic systolic HF (EF 20-25%), paroxysmal VT s/p ablation (09/2011) and repeat (06/2013), CAD, PVD (s/p R fem pop BPG 01/2010; s/p L CFA BPG 05/2002), AAA (s/p repair 04/2002), CAS (s/p L CEA 2008), HTN, arthritis, and TIA.   ECHO 09/2013: EF 20-25%, mild AI, mild MR, LA mildly dilated and RV systolic fx mildly reduced.   Over the past 2 months has experienced multiple episodes of Vtach with ICD shocks.He was last admitted to the hosptial 09/06/13-09/09/13 for recurrent VT s/p ICD shock and Qunidine was started. He was supposed to have a CPX test on the OP side and be seen in the HF clinic how presented back to the ED 09/15/13 with CP and was found to be in Vtach rate of 113, which he was cardioverted successfully.   CPX 09/16/13 Peak VO2 10.9 VE/VCO2 48 RER 1.11 Ve/MVV 43%  Developed severe hypotension during PFTs yesterday. Code Blue activated. Now better. (suspect vagal). Denies dyspnea or CP.    Objective:   Weight Range:  Vital Signs:   Temp:  [98.3 F (36.8 C)-99.2 F (37.3 C)] 99.2 F (37.3 C) (11/15 0740) Pulse Rate:  [69-76] 71 (11/15 1005) Resp:  [12-20] 20 (11/15 1005) BP: (104-127)/(45-78) 107/56 mmHg (11/15 1005) SpO2:  [90 %-100 %] 93 % (11/15 1005) Last BM Date: 09/18/13  Weight change: Filed Weights   09/15/13 0403 09/16/13 0800 09/17/13 0600  Weight: 86.183 kg (190 lb) 87.1 kg (192 lb 0.3 oz) 87.3 kg (192 lb 7.4 oz)    Intake/Output:   Intake/Output Summary (Last 24 hours) at 09/19/13 1108 Last data filed at 09/19/13 0730  Gross per 24 hour  Intake    486 ml  Output   1775 ml  Net  -1289 ml     Physical Exam: General: Well appearing. No resp difficulty; lying in bed  HEENT: normal; L CEA scar Neck: supple. JVP flat . Carotids 2+ bilat; no bruits. No lymphadenopathy or thryomegaly appreciated.  Cor: PMI nondisplaced. Regular rate &  rhythm. No rubs, gallops or murmurs.  Lungs: clear  Abdomen: soft, nontender, nondistended. No hepatosplenomegaly. No bruits or masses. Good bowel sounds.  Extremities: no cyanosis, clubbing, rash, edema  Neuro: alert & orientedx3, cranial nerves grossly intact. moves all 4 extremities w/o difficulty. Affect pleasant  Telemetry: A paced 80s  Labs: Basic Metabolic Panel:  Recent Labs Lab 09/15/13 0425 09/16/13 0415 09/17/13 1335 09/18/13 1525  NA 141 138  --  134*  K 5.0 4.2  --  4.1  CL 105 104  --  106  CO2 25 25  --   --   GLUCOSE 116* 99  --  111*  BUN 27* 24*  --  19  CREATININE 2.01* 1.67* 1.52* 1.70*  CALCIUM 9.4 8.6  --   --     Liver Function Tests: No results found for this basename: AST, ALT, ALKPHOS, BILITOT, PROT, ALBUMIN,  in the last 168 hours No results found for this basename: LIPASE, AMYLASE,  in the last 168 hours No results found for this basename: AMMONIA,  in the last 168 hours  CBC:  Recent Labs Lab 09/15/13 0425 09/16/13 0415 09/17/13 1335 09/18/13 1525  WBC 5.8 5.7 6.1  --   HGB 12.2* 11.9* 12.1* 12.6*  HCT 37.4* 35.5* 36.2* 37.0*  MCV 100.5* 98.6 98.6  --   PLT 124* 124*  137*  --     Cardiac Enzymes:  Recent Labs Lab 09/15/13 0750 09/15/13 1908  TROPONINI <0.30 <0.30    BNP: BNP (last 3 results)  Recent Labs  11/23/12 0901 09/15/13 0424 09/15/13 0750  PROBNP 1891.0* 2704.0* 3038.0*    Imaging: Ct Abdomen Pelvis Wo Contrast  09/18/2013   CLINICAL DATA:  VAD evaluation  EXAM: CT CHEST, ABDOMEN, AND PELVIS WITHOUT CONTRAST  TECHNIQUE: Multidetector CT imaging of the chest, abdomen and pelvis was performed following without administration of intravenous contrast.  FINDINGS: CT CHEST FINDINGS  No pleural effusion identified. Moderate changes of centrilobular emphysema identified. No airspace consolidation. No interstitial edema. Calcified granuloma is identified within the right upper lobe. Next  Mild cardiac enlargement.  Calcified atherosclerotic disease affects the thoracic aorta, left main, RCA, LAD and left circumflex coronary arteries. Left chest wall AICD is noted with leads in the right atrial appendage and right ventricle. No mediastinal or hilar adenopathy identified. The esophagus appears normal. No enlarged axillary or supraclavicular adenopathy.  Review of the visualized osseous structures is negative for aggressive lytic or sclerotic bone lesion.  CT ABDOMEN AND PELVIS FINDINGS  Diffuse increased attenuation throughout the liver parenchyma is identified. This is compatible with amiodarone therapy. No suspicious liver abnormality identified. Prior cholecystectomy. No biliary dilatation. The pancreas is normal. Normal appearance of the spleen.  The adrenal glands are both normal. There is large cyst arising from the inferior right kidney measuring 5.7 cm. This is incompletely characterized without IV contrast. Within the mid pole of the left kidney is a complicated cyst measuring 3.5 cm. This contains areas of mural calcification, image 79. Stone is identified within the right kidney measuring 1.2 cm, image 75/ series 2. No obstructive uropathy. Urinary bladder appears normal. Prostate gland and seminal vesicles are unremarkable.  The patient is status post graft repair of the abdominal aorta. No upper abdominal adenopathy identified. There is no pelvic or inguinal adenopathy identified. The stomach appears normal. The small bowel loops have a normal course and caliber. No obstruction. The appendix is visualized measuring up to 8 mm in diameter, image 93/series 2. There is appendicolith of identified, image 90/series 2. Normal appearance of the colon. Stress set normal appearance of the proximal colon. Scattered distal colonic diverticula identified.  There is a large soft tissue defect within the right groin area overlying the right common femoral artery and vein. This may be related to catheterization.  Review of the  visualized osseous structures is significant for osteopenia. Mild spondylosis identified within the thoracic spine.  IMPRESSION: 1. No acute cardiopulmonary abnormalities. 2. Atherosclerotic disease including 3 vessel coronary artery calcifications. 3. Emphysema. 4. Prior granulomatous disease. 5. Previous graft repair of abdominal aortic aneurysm. 6. Nonobstructing left renal calculus. 7. Bilateral renal cysts of varying complexity. The left renal cyst is complicated by areas of mural calcification. These are incompletely characterized without IV contrast material. 8. Mild thickening of the appendix which contains an appendicolith.  : COMPARISON:  None.   Electronically Signed   By: Signa Kell M.D.   On: 09/18/2013 15:40   Ct Chest Without Contrast  09/18/2013   CLINICAL DATA:  VAD evaluation  EXAM: CT CHEST, ABDOMEN, AND PELVIS WITHOUT CONTRAST  TECHNIQUE: Multidetector CT imaging of the chest, abdomen and pelvis was performed following without administration of intravenous contrast.  FINDINGS: CT CHEST FINDINGS  No pleural effusion identified. Moderate changes of centrilobular emphysema identified. No airspace consolidation. No interstitial edema. Calcified granuloma is identified within the  right upper lobe. Next  Mild cardiac enlargement. Calcified atherosclerotic disease affects the thoracic aorta, left main, RCA, LAD and left circumflex coronary arteries. Left chest wall AICD is noted with leads in the right atrial appendage and right ventricle. No mediastinal or hilar adenopathy identified. The esophagus appears normal. No enlarged axillary or supraclavicular adenopathy.  Review of the visualized osseous structures is negative for aggressive lytic or sclerotic bone lesion.  CT ABDOMEN AND PELVIS FINDINGS  Diffuse increased attenuation throughout the liver parenchyma is identified. This is compatible with amiodarone therapy. No suspicious liver abnormality identified. Prior cholecystectomy. No biliary  dilatation. The pancreas is normal. Normal appearance of the spleen.  The adrenal glands are both normal. There is large cyst arising from the inferior right kidney measuring 5.7 cm. This is incompletely characterized without IV contrast. Within the mid pole of the left kidney is a complicated cyst measuring 3.5 cm. This contains areas of mural calcification, image 79. Stone is identified within the right kidney measuring 1.2 cm, image 75/ series 2. No obstructive uropathy. Urinary bladder appears normal. Prostate gland and seminal vesicles are unremarkable.  The patient is status post graft repair of the abdominal aorta. No upper abdominal adenopathy identified. There is no pelvic or inguinal adenopathy identified. The stomach appears normal. The small bowel loops have a normal course and caliber. No obstruction. The appendix is visualized measuring up to 8 mm in diameter, image 93/series 2. There is appendicolith of identified, image 90/series 2. Normal appearance of the colon. Stress set normal appearance of the proximal colon. Scattered distal colonic diverticula identified.  There is a large soft tissue defect within the right groin area overlying the right common femoral artery and vein. This may be related to catheterization.  Review of the visualized osseous structures is significant for osteopenia. Mild spondylosis identified within the thoracic spine.  IMPRESSION: 1. No acute cardiopulmonary abnormalities. 2. Atherosclerotic disease including 3 vessel coronary artery calcifications. 3. Emphysema. 4. Prior granulomatous disease. 5. Previous graft repair of abdominal aortic aneurysm. 6. Nonobstructing left renal calculus. 7. Bilateral renal cysts of varying complexity. The left renal cyst is complicated by areas of mural calcification. These are incompletely characterized without IV contrast material. 8. Mild thickening of the appendix which contains an appendicolith.  : COMPARISON:  None.   Electronically  Signed   By: Signa Kell M.D.   On: 09/18/2013 15:40     Medications:     Scheduled Medications: . amiodarone  400 mg Oral Daily  . aspirin EC  81 mg Oral Daily  . atorvastatin  80 mg Oral Daily  . carvedilol  6.25 mg Oral BID WC  . clopidogrel  75 mg Oral Daily  . furosemide  20 mg Oral Daily  . heparin  5,000 Units Subcutaneous Q8H  . levothyroxine  112 mcg Oral QAC breakfast  . quiniDINE gluconate  324 mg Oral Q12H  . ramipril  2.5 mg Oral Daily  . sodium chloride  3 mL Intravenous Q12H  . sodium chloride  3 mL Intravenous Q12H    Infusions:    PRN Medications: sodium chloride, acetaminophen, acetaminophen, nitroGLYCERIN, ondansetron (ZOFRAN) IV, sodium chloride, sodium chloride   Assessment:   1) Recurrent VTach, s/p ICD shock  --ablation 09/2011 and 06/2013  2) Chronic systolic HF, EF 20-25% (09/2013)  3) Mixed NICM/ICM  4) CAD  --s/p previous inferolateral MI.  --Chronically occluded RCA with L-> R collaterals per cath 1/14  5) PAD, severe  6) HTN  7)  LBBB   Plan/Discussion:    Doing well. Can go home today on current meds. Will see back in HF Clinic this week with plan for LVAD placement after Thanksgiving. Will stop Plavix.    Daniel Bensimhon,MD 11:08 AM

## 2013-09-19 NOTE — Progress Notes (Signed)
ANTIBIOTIC CONSULT NOTE - INITIAL  Pharmacy Consult for vancomycin + ceftriaxone Indication: Empiric (unknown source)  Allergies  Allergen Reactions  . Meperidine Hcl Other (See Comments)    "CAN'T MOVE" CATATONIC PER PT.  [name] Opium Other (See Comments)    "CAN'T MOVE" CATATONIC PER PT.    Patient Measurements: Height: 6' (182.9 cm) Weight: 192 lb 7.4 oz (87.3 kg) IBW/kg (Calculated) : 77.6 Adjusted Body Weight:   Vital Signs: Temp: 99.6 F (37.6 C) (11/15 1542) Temp src: Oral (11/15 1542) BP: 111/53 mmHg (11/15 2000) Pulse Rate: 74 (11/15 2000) Intake/Output from previous day: 11/14 0701 - 11/15 0700 In: 786 [P.O.:780; I.V.:6] Out: 1800 [Urine:1800] Intake/Output from this shift:    Labs:  Recent Labs  09/17/13 1335 09/18/13 1525  WBC 6.1  --   HGB 12.1* 12.6*  PLT 137*  --   CREATININE 1.52* 1.70*   Estimated Creatinine Clearance: 42.5 ml/min (by C-G formula based on Cr of 1.7). No results found for this basename: VANCOTROUGH, Leodis Binet, VANCORANDOM, GENTTROUGH, GENTPEAK, GENTRANDOM, TOBRATROUGH, TOBRAPEAK, TOBRARND, AMIKACINPEAK, AMIKACINTROU, AMIKACIN,  in the last 72 hours   Microbiology: Recent Results (from the past 720 hour(s))  MRSA PCR SCREENING     Status: None   Collection Time    09/15/13  7:12 AM      Result Value Range Status   MRSA by PCR NEGATIVE  NEGATIVE Final   Comment:            The GeneXpert MRSA Assay (FDA     approved for NASAL specimens     only), is one component of a     comprehensive MRSA colonization     surveillance program. It is not     intended to diagnose MRSA     infection nor to guide or     monitor treatment for     MRSA infections.    Medical History: Past Medical History  Diagnosis Date  . CAD (coronary artery disease)     s/p MI 1982;  s/p DES to CFX 2011;  s/p NSTEMI 4/12 in setting of VTach;  cath 7/12:  LM ok, LAD mid 30-40%, Dx's with 40%, mCFX stent patent, prox to mid RCA occluded with R to R and L to R  collats.  Medical Tx was continued  . Bradycardia   . Ventricular tachycardia     s/p AICD;   h/o ICD shock;  Amiodarone Rx. S/P ablation in 2012  . 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 HTN (hypertension)   . HLD (hyperlipidemia)   . Cytopenia   . Hypothyroidism   . Renal insufficiency   . Claudication   . Chronic systolic heart failure   . Ischemic cardiomyopathy     echo 7/12:  EF 25-30%, mild AI, mod MR, mod LAE  . TIA (transient ischemic attack) 2001  . CVD (cerebrovascular disease)     left CEA 2008  . Stroke   . Anemia   . Inappropriate therapy from implantable cardioverter-defibrillator 04/02/2013    ATP for sinus tach  SVT wavelet identified SVT but was no  passive   . Myocardial infarction 1992  . Anginal pain   . Automatic implantable cardioverter-defibrillator in situ     [employer] Evera device serial number [id number]    . Sleep  apnea     denies wearing mask (05/25/2013)  . Arthritis     "a little in my knees" (05/25/2013)  . Melanoma of ear 2013    "near left ear" (05/25/2013)    Medications:  Anti-infectives   Start     Dose/Rate Route Frequency Ordered Stop   09/19/13 2100  cefTRIAXone (ROCEPHIN) 1 g in dextrose 5 % 50 mL IVPB     1 g 100 mL/hr over 30 Minutes Intravenous Every 24 hours 09/19/13 2029     09/19/13 2100  vancomycin (VANCOCIN) IVPB 750 mg/150 ml premix     750 mg 150 mL/hr over 60 Minutes Intravenous Every 12 hours 09/19/13 2029       Assessment: 71 yom admitted for VAD work-up to start empiric antibiotics for unknown source of infection. Pt is afebrile and WBC is WNL. Scr is elevated at 1.7.  Goal of Therapy:  Vancomycin trough level 15-20 mcg/ml  Plan:  1. Ceftriaxone 1gm IV Q24H 2. Vanc 750mg  IV Q12H 3. F/u renal fxn, C&S, clinical status and trough at [facility], Drake Leach 09/19/2013,8:30  PM

## 2013-09-19 NOTE — Progress Notes (Signed)
Patient noted to by hypotensive with SBP 60s and symptoms of near syncope ("I'm going out in a few seconds!") I was here to discharge him. Discussed with Dr. Arvilla Meres. Will hold on D/c. Give IVFs x 250 mL. Hold Altace. Decrease coreg to 3.125 bid. Continue to monitor. Signed, Tereso Newcomer, PA-C   09/19/2013 1:34 PM

## 2013-09-20 DIAGNOSIS — R509 Fever, unspecified: Secondary | ICD-10-CM

## 2013-09-20 DIAGNOSIS — A419 Sepsis, unspecified organism: Secondary | ICD-10-CM

## 2013-09-20 LAB — RESPIRATORY VIRUS PANEL
Adenovirus: NOT DETECTED
Influenza A H1: NOT DETECTED
Influenza A: NOT DETECTED
Influenza B: NOT DETECTED
Metapneumovirus: NOT DETECTED
Parainfluenza 1: NOT DETECTED
Parainfluenza 2: NOT DETECTED
Parainfluenza 3: NOT DETECTED
Respiratory Syncytial Virus B: NOT DETECTED

## 2013-09-20 LAB — CBC WITH DIFFERENTIAL/PLATELET
Basophils Relative: 1 % (ref 0–1)
Eosinophils Absolute: 0.1 10*3/uL (ref 0.0–0.7)
Eosinophils Relative: 2 % (ref 0–5)
HCT: 36.9 % — ABNORMAL LOW (ref 39.0–52.0)
Hemoglobin: 12.2 g/dL — ABNORMAL LOW (ref 13.0–17.0)
Lymphocytes Relative: 10 % — ABNORMAL LOW (ref 12–46)
Lymphs Abs: 0.5 10*3/uL — ABNORMAL LOW (ref 0.7–4.0)
MCH: 32.7 pg (ref 26.0–34.0)
MCHC: 33.1 g/dL (ref 30.0–36.0)
MCV: 98.9 fL (ref 78.0–100.0)
Monocytes Absolute: 0.5 10*3/uL (ref 0.1–1.0)
Monocytes Relative: 11 % (ref 3–12)
Platelets: 112 10*3/uL — ABNORMAL LOW (ref 150–400)
WBC: 4.4 10*3/uL (ref 4.0–10.5)

## 2013-09-20 LAB — COMPREHENSIVE METABOLIC PANEL
AST: 100 U/L — ABNORMAL HIGH (ref 0–37)
Albumin: 3.2 g/dL — ABNORMAL LOW (ref 3.5–5.2)
BUN: 20 mg/dL (ref 6–23)
CO2: 24 mEq/L (ref 19–32)
Chloride: 99 mEq/L (ref 96–112)
Creatinine, Ser: 1.74 mg/dL — ABNORMAL HIGH (ref 0.50–1.35)
GFR calc Af Amer: 43 mL/min — ABNORMAL LOW (ref >90)
Glucose, Bld: 84 mg/dL (ref 70–99)
Total Bilirubin: 0.6 mg/dL (ref 0.3–1.2)
Total Protein: 6.6 g/dL (ref 6.0–8.3)

## 2013-09-20 LAB — URINE CULTURE: Culture: NO GROWTH

## 2013-09-20 NOTE — Progress Notes (Signed)
Pt Temp now 102.9 , pt c/o of chills and nausea, vomiting small amout yellow emesis. Pt given Zofran IV for nausea, tylenol for Temp.  Placed on Droplet precautions and Influenza swab sent.

## 2013-09-20 NOTE — Progress Notes (Signed)
Advanced Heart Failure Rounding Note   Subjective:    [name] is a 72 yo man with an ischemic CM s/p ICD implant, chronic systolic HF (EF 20-25%), paroxysmal VT s/p ablation (09/2011) and repeat (06/2013), CAD, PVD (s/p R fem pop BPG 01/2010; s/p L CFA BPG 05/2002), AAA (s/p repair 04/2002), CAS (s/p L CEA 2008), HTN, arthritis, and TIA.   ECHO 09/2013: EF 20-25%, mild AI, mild MR, LA mildly dilated and RV systolic fx mildly reduced.   Over the past 2 months has experienced multiple episodes of Vtach with ICD shocks.He was last admitted to the hosptial 09/06/13-09/09/13 for recurrent VT s/p ICD shock and Qunidine was started. He was supposed to have a CPX test on the OP side and be seen in the HF clinic how presented back to the ED 09/15/13 with CP and was found to be in Vtach rate of 113, which he was cardioverted successfully.   CPX 09/16/13 Peak VO2 10.9 VE/VCO2 48 RER 1.11 Ve/MVV 43%  Developed severe hypotension with fevers and chills concerning for sepsis. Cultures drawn. Started vanc and zosyn.  Remains febrile with Tm 102.9  . Being checked for flu. No bloodwork today yet. UA negative. BCx pending. Receiving tylenol. Feels better.    Objective:   Weight Range:  Vital Signs:   Temp:  [97.9 F (36.6 C)-102.9 F (39.4 C)] 97.9 F (36.6 C) (11/16 0754) Pulse Rate:  [66-87] 73 (11/16 1000) Resp:  [12-25] 25 (11/16 1000) BP: (73-134)/(39-69) 98/55 mmHg (11/16 1000) SpO2:  [89 %-100 %] 99 % (11/16 1000) Last BM Date: 09/19/13  Weight change: Filed Weights   09/15/13 0403 09/16/13 0800 09/17/13 0600  Weight: 86.183 kg (190 lb) 87.1 kg (192 lb 0.3 oz) 87.3 kg (192 lb 7.4 oz)    Intake/Output:   Intake/Output Summary (Last 24 hours) at 09/20/13 1030 Last data filed at 09/20/13 0849  Gross per 24 hour  Intake   1260 ml  Output   1075 ml  Net    185 ml     Physical Exam: General: Fatigued appearing. No resp difficulty; lying in bed  HEENT: normal; L CEA scar Neck:  supple. JVP flat . Carotids 2+ bilat; no bruits. No lymphadenopathy or thryomegaly appreciated.  Cor: PMI nondisplaced. Regular rate & rhythm. No rubs, gallops or murmurs.  Lungs: clear  Abdomen: soft, nontender, nondistended. No hepatosplenomegaly. No bruits or masses. Good bowel sounds.  Extremities: no cyanosis, clubbing, rash, edema  Neuro: alert & orientedx3, cranial nerves grossly intact. moves all 4 extremities w/o difficulty. Affect pleasant  Telemetry: A paced 80s  Labs: Basic Metabolic Panel:  Recent Labs Lab 09/15/13 0425 09/16/13 0415 09/17/13 1335 09/18/13 1525  NA 141 138  --  134*  K 5.0 4.2  --  4.1  CL 105 104  --  106  CO2 25 25  --   --   GLUCOSE 116* 99  --  111*  BUN 27* 24*  --  19  CREATININE 2.01* 1.67* 1.52* 1.70*  CALCIUM 9.4 8.6  --   --     Liver Function Tests: No results found for this basename: AST, ALT, ALKPHOS, BILITOT, PROT, ALBUMIN,  in the last 168 hours No results found for this basename: LIPASE, AMYLASE,  in the last 168 hours No results found for this basename: AMMONIA,  in the last 168 hours  CBC:  Recent Labs Lab 09/15/13 0425 09/16/13 0415 09/17/13 1335 09/18/13 1525  WBC 5.8 5.7 6.1  --  HGB 12.2* 11.9* 12.1* 12.6*  HCT 37.4* 35.5* 36.2* 37.0*  MCV 100.5* 98.6 98.6  --   PLT 124* 124* 137*  --     Cardiac Enzymes:  Recent Labs Lab 09/15/13 0750 09/15/13 1908  TROPONINI <0.30 <0.30    BNP: BNP (last 3 results)  Recent Labs  11/23/12 0901 09/15/13 0424 09/15/13 0750  PROBNP 1891.0* 2704.0* 3038.0*    Imaging: Ct Abdomen Pelvis Wo Contrast  09/18/2013   CLINICAL DATA:  VAD evaluation  EXAM: CT CHEST, ABDOMEN, AND PELVIS WITHOUT CONTRAST  TECHNIQUE: Multidetector CT imaging of the chest, abdomen and pelvis was performed following without administration of intravenous contrast.  FINDINGS: CT CHEST FINDINGS  No pleural effusion identified. Moderate changes of centrilobular emphysema identified. No airspace  consolidation. No interstitial edema. Calcified granuloma is identified within the right upper lobe. Next  Mild cardiac enlargement. Calcified atherosclerotic disease affects the thoracic aorta, left main, RCA, LAD and left circumflex coronary arteries. Left chest wall AICD is noted with leads in the right atrial appendage and right ventricle. No mediastinal or hilar adenopathy identified. The esophagus appears normal. No enlarged axillary or supraclavicular adenopathy.  Review of the visualized osseous structures is negative for aggressive lytic or sclerotic bone lesion.  CT ABDOMEN AND PELVIS FINDINGS  Diffuse increased attenuation throughout the liver parenchyma is identified. This is compatible with amiodarone therapy. No suspicious liver abnormality identified. Prior cholecystectomy. No biliary dilatation. The pancreas is normal. Normal appearance of the spleen.  The adrenal glands are both normal. There is large cyst arising from the inferior right kidney measuring 5.7 cm. This is incompletely characterized without IV contrast. Within the mid pole of the left kidney is a complicated cyst measuring 3.5 cm. This contains areas of mural calcification, image 79. Stone is identified within the right kidney measuring 1.2 cm, image 75/ series 2. No obstructive uropathy. Urinary bladder appears normal. Prostate gland and seminal vesicles are unremarkable.  The patient is status post graft repair of the abdominal aorta. No upper abdominal adenopathy identified. There is no pelvic or inguinal adenopathy identified. The stomach appears normal. The small bowel loops have a normal course and caliber. No obstruction. The appendix is visualized measuring up to 8 mm in diameter, image 93/series 2. There is appendicolith of identified, image 90/series 2. Normal appearance of the colon. Stress set normal appearance of the proximal colon. Scattered distal colonic diverticula identified.  There is a large soft tissue defect  within the right groin area overlying the right common femoral artery and vein. This may be related to catheterization.  Review of the visualized osseous structures is significant for osteopenia. Mild spondylosis identified within the thoracic spine.  IMPRESSION: 1. No acute cardiopulmonary abnormalities. 2. Atherosclerotic disease including 3 vessel coronary artery calcifications. 3. Emphysema. 4. Prior granulomatous disease. 5. Previous graft repair of abdominal aortic aneurysm. 6. Nonobstructing left renal calculus. 7. Bilateral renal cysts of varying complexity. The left renal cyst is complicated by areas of mural calcification. These are incompletely characterized without IV contrast material. 8. Mild thickening of the appendix which contains an appendicolith.  : COMPARISON:  None.   Electronically Signed   By: Signa Kell M.D.   On: 09/18/2013 15:40   Ct Chest Without Contrast  09/18/2013   CLINICAL DATA:  VAD evaluation  EXAM: CT CHEST, ABDOMEN, AND PELVIS WITHOUT CONTRAST  TECHNIQUE: Multidetector CT imaging of the chest, abdomen and pelvis was performed following without administration of intravenous contrast.  FINDINGS: CT CHEST FINDINGS  No pleural effusion identified. Moderate changes of centrilobular emphysema identified. No airspace consolidation. No interstitial edema. Calcified granuloma is identified within the right upper lobe. Next  Mild cardiac enlargement. Calcified atherosclerotic disease affects the thoracic aorta, left main, RCA, LAD and left circumflex coronary arteries. Left chest wall AICD is noted with leads in the right atrial appendage and right ventricle. No mediastinal or hilar adenopathy identified. The esophagus appears normal. No enlarged axillary or supraclavicular adenopathy.  Review of the visualized osseous structures is negative for aggressive lytic or sclerotic bone lesion.  CT ABDOMEN AND PELVIS FINDINGS  Diffuse increased attenuation throughout the liver parenchyma  is identified. This is compatible with amiodarone therapy. No suspicious liver abnormality identified. Prior cholecystectomy. No biliary dilatation. The pancreas is normal. Normal appearance of the spleen.  The adrenal glands are both normal. There is large cyst arising from the inferior right kidney measuring 5.7 cm. This is incompletely characterized without IV contrast. Within the mid pole of the left kidney is a complicated cyst measuring 3.5 cm. This contains areas of mural calcification, image 79. Stone is identified within the right kidney measuring 1.2 cm, image 75/ series 2. No obstructive uropathy. Urinary bladder appears normal. Prostate gland and seminal vesicles are unremarkable.  The patient is status post graft repair of the abdominal aorta. No upper abdominal adenopathy identified. There is no pelvic or inguinal adenopathy identified. The stomach appears normal. The small bowel loops have a normal course and caliber. No obstruction. The appendix is visualized measuring up to 8 mm in diameter, image 93/series 2. There is appendicolith of identified, image 90/series 2. Normal appearance of the colon. Stress set normal appearance of the proximal colon. Scattered distal colonic diverticula identified.  There is a large soft tissue defect within the right groin area overlying the right common femoral artery and vein. This may be related to catheterization.  Review of the visualized osseous structures is significant for osteopenia. Mild spondylosis identified within the thoracic spine.  IMPRESSION: 1. No acute cardiopulmonary abnormalities. 2. Atherosclerotic disease including 3 vessel coronary artery calcifications. 3. Emphysema. 4. Prior granulomatous disease. 5. Previous graft repair of abdominal aortic aneurysm. 6. Nonobstructing left renal calculus. 7. Bilateral renal cysts of varying complexity. The left renal cyst is complicated by areas of mural calcification. These are incompletely characterized  without IV contrast material. 8. Mild thickening of the appendix which contains an appendicolith.  : COMPARISON:  None.   Electronically Signed   By: Signa Kell M.D.   On: 09/18/2013 15:40     Medications:     Scheduled Medications: . amiodarone  400 mg Oral Daily  . aspirin EC  81 mg Oral Daily  . atorvastatin  80 mg Oral Daily  . carvedilol  3.125 mg Oral BID WC  . cefTRIAXone (ROCEPHIN)  IV  1 g Intravenous Q24H  . clopidogrel  75 mg Oral Daily  . furosemide  20 mg Oral Daily  . heparin  5,000 Units Subcutaneous Q8H  . levothyroxine  112 mcg Oral QAC breakfast  . quiniDINE gluconate  324 mg Oral Q12H  . sodium chloride  3 mL Intravenous Q12H  . vancomycin  750 mg Intravenous Q12H    Infusions:    PRN Medications: acetaminophen, acetaminophen, nitroGLYCERIN, ondansetron (ZOFRAN) IV, sodium chloride   Assessment:   1) Recurrent VTach, s/p ICD shock  --ablation 09/2011 and 06/2013  2) Chronic systolic HF, EF 20-25% (09/2013)  3) Mixed NICM/ICM  4) CAD  --s/p previous inferolateral MI.  --  Chronically occluded RCA with L-> R collaterals per cath 1/14  5) PAD, severe  6) HTN  7) LBBB 8) Sepsis  Plan/Discussion:    High fevers and hypotension concerning for sepsis/blood stream infection. Await cultures. Treat empircally with vanc/zosyn. If staph or enterococcus will need TEE to look at ICD leads.   Plan will be for VAD placement after Thanksgiving.   No VT on tele.   Sadi Arave,MD 10:30 AM

## 2013-09-20 NOTE — Consult Note (Signed)
[address]       [place] [address]             [phone]      Cardiothoracic Surgery Consult  Reason for Consult: Chronic systolic heart failure secondary to ischemic cardiomyopathy. Referring Physician: Dr. Arvilla Meres  [name] is an 72 y.o. male.  HPI:   The patient is a 72 year old vasculopath with ischemic cardiomyopathy and chronic systolic heart failure with an EF of 20-25%, paroxysmal VT s/p ablations in 11/12 and 08/14, s/p multiple vascular surgeries as outlined below who up until 2-3 months ago was doing quite well. He was walking 3 1/2 miles per day and working part-time as a Electrical engineer. He says he went to play golf one day and had to quit after a couple holes due to exhaustion and hasn't been the same since with shortness of breath occuring at about 500 ft. He has also been experiencing multiple episodes of VT with ICD shocks over the past two months and was admitted 11/2-11/5 for recurrent VT with ICD shock and was started on Quinidine. He was scheduled for an outpt CPX but was readmitted 11/11 with chest pain and was found to be in VT with a rate of 113 and was cardioverted. He underwent CPX on 11/12 and this showed a Peak VO2 of 10.9 and a VE/VCO2 slope of 48. A right heart cath on 11/13 showed well-compensated filling pressures and CI of 1.8 by thermodilution and 2.6 by Fick. His last cardiac cath in Jan 2014 showed insignificant disease in the LAD and LCX with a patent stent in the mid LCX. The RCA has a long proximal 95% stenosis within a calcified segment with a relatively small distal vessel receiving collaterals from the left. His most recent echo in 09/2013 showed and LVEF of 20-25% with mild AI, mild MR and mildly reduced RV systolic function.    Past Medical History  Diagnosis Date  . CAD (coronary artery disease)     s/p MI 1982;  s/p DES to CFX 2011;  s/p NSTEMI 4/12 in setting of VTach;  cath 7/12:  LM ok, LAD mid  30-40%, Dx's with 40%, mCFX stent patent, prox to mid RCA occluded with R to R and L to R collats.  Medical Tx was continued  . Bradycardia   . Ventricular tachycardia     s/p AICD;   h/o ICD shock;  Amiodarone Rx. S/P ablation in 2012  . 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 HTN (hypertension)   . HLD (hyperlipidemia)   . Cytopenia   . Hypothyroidism   . Renal insufficiency   . Claudication   . Chronic systolic heart failure   . Ischemic cardiomyopathy     echo 7/12:  EF 25-30%, mild AI, mod MR, mod LAE  . TIA (transient ischemic attack) 2001  . CVD (cerebrovascular disease)     left CEA 2008  . Stroke   . Anemia   . Inappropriate therapy from implantable cardioverter-defibrillator 04/02/2013    ATP for sinus tach  SVT wavelet identified SVT but was no  passive   . Myocardial infarction 1992  . Anginal pain   . Automatic implantable cardioverter-defibrillator in situ     Medtronic Evera  device serial number [id number]    . Sleep apnea     denies wearing mask (05/25/2013)  . Arthritis     "a little in my knees" (05/25/2013)  . Melanoma of ear 2013    "near left ear" (05/25/2013)    Past Surgical History  Procedure Laterality Date  . Femoral-popliteal bypass graft Right 2011  . Cardiac defibrillator placement  2003; 2005; 05/25/2013    2014: Medtronic Evera device serial number [id number]  . Revascularization / in-situ graft leg    . Renal artery bypass Bilateral 2003  . Back surgery    . Lumbar disc surgery  1974; 2000's  . Knee arthroscopy Bilateral 1990's  . Elbow arthroscopy Right 1990's  . Cholecystectomy  12/15/?2010  . Lipoma excision Left 2013    "near ear" (05/25/2013)  . Heel spur surgery Right 1990's  . Cataract extraction w/ intraocular lens  implant, bilateral Bilateral 2000  . Carotid endarterectomy Left ~ 2007  . Doppler  echocardiography  2011  . Tonsillectomy  1946  . Femoral-popliteal bypass graft Left 05/2002    Hattie Perch 05/06/2002 (05/25/2013)  . Tumor excision Left 1960's    "fatty tumor" (05/25/2013)  . Cardiac electrophysiology study and ablation  2012  . Coronary angioplasty  1992  . Cardiac catheterization      "several" (05/25/2013)  . Coronary angioplasty with stent placement      "last one was 11/2012  (05/25/2013)    Family History  Problem Relation Age of Onset  . Coronary artery disease    . Heart attack Mother   . Coronary artery disease Mother   . Coronary artery disease Father   . Stroke Father     Social History:  reports that he quit smoking about 11 years ago. His smoking use included Cigarettes. He has a 18.5 pack-year smoking history. He has never used smokeless tobacco. He reports that he drinks alcohol. He reports that he does not use illicit drugs.  Allergies:  Allergies  Allergen Reactions  . Meperidine Hcl Other (See Comments)    "CAN'T MOVE" CATATONIC PER PT.  [name] Opium Other (See Comments)    "CAN'T MOVE" CATATONIC PER PT.    Medications:  I have reviewed the patient's current medications. Prior to Admission:  Prescriptions prior to admission  Medication Sig Dispense Refill  . amiodarone (PACERONE) 200 MG tablet Take 2 tablets (400 mg total) by mouth daily.  30 tablet  4  . aspirin 81 MG tablet Take 81 mg by mouth daily.       [name] atorvastatin (LIPITOR) 80 MG tablet Take 80 mg by mouth daily.       [name] CALCIUM-MAGNESIUM-ZINC PO Take 3 tablets by mouth at bedtime. 1000 mg calcium, 400 mg magnesium, 25 mg zinc      . carvedilol (COREG) 6.25 MG tablet Take 0.5 tablets (3.125 mg total) by mouth 2 (two) times daily with a meal.  60 tablet  4  . clopidogrel (PLAVIX) 75 MG tablet Take 75 mg by mouth daily.       . fexofenadine (ALLEGRA) 180 MG tablet Take 180 mg by mouth daily as needed (for allergies).      . furosemide (LASIX) 20 MG tablet Take 20 mg by mouth daily.   1 tablet     . levothyroxine (SYNTHROID) 112 MCG tablet Take 1 tablet (112 mcg total) by mouth daily.      . Multiple Vitamin (MULTIVITAMIN) tablet Take 1 tablet by mouth daily.       [name]  Multiple Vitamins-Minerals (EYE VITAMINS PO) Take 1 capsule by mouth 2 (two) times daily.       . nitroGLYCERIN (NITROSTAT) 0.4 MG SL tablet Place 0.4 mg under the tongue every 5 (five) minutes as needed for chest pain. For chest pain      . Omega-3 Fatty Acids (FISH OIL) 1200 MG CAPS Take 1 capsule by mouth daily.      [name] quiniDINE gluconate 324 MG CR tablet Take 1 tablet (324 mg total) by mouth every 12 (twelve) hours.  60 tablet  4  . ramipril (ALTACE) 2.5 MG capsule Take 1 capsule (2.5 mg total) by mouth daily.  30 capsule  3   Scheduled: . amiodarone  400 mg Oral Daily  . aspirin EC  81 mg Oral Daily  . atorvastatin  80 mg Oral Daily  . carvedilol  3.125 mg Oral BID WC  . cefTRIAXone (ROCEPHIN)  IV  1 g Intravenous Q24H  . clopidogrel  75 mg Oral Daily  . furosemide  20 mg Oral Daily  . heparin  5,000 Units Subcutaneous Q8H  . levothyroxine  112 mcg Oral QAC breakfast  . quiniDINE gluconate  324 mg Oral Q12H  . sodium chloride  3 mL Intravenous Q12H  . vancomycin  750 mg Intravenous Q12H   Continuous:  ZOX:[id number], acetaminophen, nitroGLYCERIN, ondansetron (ZOFRAN) IV, sodium chloride Anti-infectives   Start     Dose/Rate Route Frequency Ordered Stop   09/19/13 2100  cefTRIAXone (ROCEPHIN) 1 g in dextrose 5 % 50 mL IVPB     1 g 100 mL/hr over 30 Minutes Intravenous Every 24 hours 09/19/13 2029     09/19/13 2100  vancomycin (VANCOCIN) IVPB 750 mg/150 ml premix     750 mg 150 mL/hr over 60 Minutes Intravenous Every 12 hours 09/19/13 2029        Results for orders placed during the hospital encounter of 09/15/13 (from the past 48 hour(s))  URINE CULTURE     Status: None   Collection Time    09/19/13 10:13 PM      Result Value Range   Specimen Description URINE, CLEAN CATCH     Special Requests  Normal     Culture  Setup Time       Value: 09/20/2013 00:22     Performed at [facility]   Culture       Value: NO GROWTH     Performed at [facility]   Report Status 09/20/2013 FINAL    CBC WITH DIFFERENTIAL     Status: Abnormal   Collection Time    09/20/13 12:00 PM      Result Value Range   WBC 4.4  4.0 - 10.5 K/uL   RBC 3.73 (*) 4.22 - 5.81 MIL/uL   Hemoglobin 12.2 (*) 13.0 - 17.0 g/dL   HCT 14.7 (*) 82.9 - 56.2 %   MCV 98.9  78.0 - 100.0 fL   MCH 32.7  26.0 - 34.0 pg   MCHC 33.1  30.0 - 36.0 g/dL   RDW 13.0  86.5 - 78.4 %   Platelets 112 (*) 150 - 400 K/uL   Comment: PLATELET COUNT CONFIRMED BY SMEAR   Neutrophils Relative % 77  43 - 77 %   Neutro Abs 3.4  1.7 - 7.7 K/uL   Lymphocytes Relative 10 (*) 12 - 46 %   Lymphs Abs 0.5 (*) 0.7 - 4.0 K/uL   Monocytes Relative 11  3 - 12 %   Monocytes  Absolute 0.5  0.1 - 1.0 K/uL   Eosinophils Relative 2  0 - 5 %   Eosinophils Absolute 0.1  0.0 - 0.7 K/uL   Basophils Relative 1  0 - 1 %   Basophils Absolute 0.0  0.0 - 0.1 K/uL  COMPREHENSIVE METABOLIC PANEL     Status: Abnormal   Collection Time    09/20/13 12:00 PM      Result Value Range   Sodium 134 (*) 135 - 145 mEq/L   Potassium 4.4  3.5 - 5.1 mEq/L   Chloride 99  96 - 112 mEq/L   CO2 24  19 - 32 mEq/L   Glucose, Bld 84  70 - 99 mg/dL   BUN 20  6 - 23 mg/dL   Creatinine, Ser 6.30 (*) 0.50 - 1.35 mg/dL   Calcium 8.8  8.4 - 16.0 mg/dL   Total Protein 6.6  6.0 - 8.3 g/dL   Albumin 3.2 (*) 3.5 - 5.2 g/dL   AST 109 (*) 0 - 37 U/L   ALT 82 (*) 0 - 53 U/L   Alkaline Phosphatase 131 (*) 39 - 117 U/L   Total Bilirubin 0.6  0.3 - 1.2 mg/dL   GFR calc non Af [name] 37 (*) >90 mL/min   GFR calc Af [name] 43 (*) >90 mL/min   Comment: (NOTE)     The eGFR has been calculated using the CKD EPI equation.     This calculation has not been validated in all clinical situations.     eGFR's persistently <90 mL/min signify possible Chronic Kidney     Disease.    No  results found.  Review of Systems  Constitutional: Positive for malaise/fatigue. Negative for fever, chills, weight loss and diaphoresis.  HENT: Negative.        Wears dentures  Eyes: Negative.   Respiratory: Positive for shortness of breath. Negative for cough, hemoptysis and sputum production.   Cardiovascular: Positive for palpitations. Negative for chest pain, orthopnea, leg swelling and PND.       Pain in hips with ambulation that sounds like claudication and resolves with rest.  Gastrointestinal: Negative.   Genitourinary: Negative.   Musculoskeletal: Negative.   Skin: Negative.   Neurological: Negative for dizziness, focal weakness, seizures and weakness.       Prior TIA several years ago resolved.   Endo/Heme/Allergies: Negative.   Psychiatric/Behavioral: Negative.    Blood pressure 105/54, pulse 69, temperature 98.6 F (37 C), temperature source Oral, resp. rate 12, height 6' (1.829 m), weight 87.3 kg (192 lb 7.4 oz), SpO2 100.00%. Physical Exam  Constitutional: He is oriented to person, place, and time.  Thin white male in no distress  HENT:  Head: Normocephalic and atraumatic.  Mouth/Throat: Oropharynx is clear and moist.  Eyes: EOM are normal. Pupils are equal, round, and reactive to light.  Neck: Normal range of motion. Neck supple. No JVD present. No tracheal deviation present. No thyromegaly present.  Cardiovascular: Normal rate, regular rhythm and normal heart sounds.  Exam reveals no gallop and no friction rub.   No murmur heard. Pedal pulses not palpable  Respiratory: Effort normal and breath sounds normal.  GI: Soft. Bowel sounds are normal. He exhibits no distension and no mass. There is no tenderness.  Old midline laparotomy scar  Musculoskeletal: Normal range of motion. He exhibits no edema and no tenderness.  Lymphadenopathy:    He has no cervical adenopathy.  Neurological: He is alert and oriented to person, place, and time.  He has normal strength. No  cranial nerve deficit or sensory deficit.  Skin: Skin is warm and dry.  Scars in both groins and legs from prior vascular surgery.  Psychiatric: He has a normal mood and affect.    CPX  09/16/2013  Referred for: Heart failure  Procedure: This patient underwent staged symptom-limited exercise treadmill testing using a modified Naughton protocol with expired gas analysis metabolic evaluation during exercise.  Demographics  Age: 2 Ht. (in.) 70 Wt. (lb) 195 BMI: 26.4 Predicted Peak VO2: 25.1 ml/kg/min  Gender: Male Ht (cm) 182.9 Wt. (kg) 88.5    Results  Pre-Exercise PFTs   FVC 4.34 (93%)   FEV1 2.69 (79%)   FEV1/FVC 62%   MVV 120 (92%)   Exercise Time: 4:45 Speed (mph): 2.0 Grade (%): 3.5    RPE: 15  Reason stopped: Patient stopped due to dyspnea (7/10)  Additional symptoms: None reported  Resting HR: 72 Peak HR: 104 (71% age predicted max HR)  BP rest: 118/78 BP peak: 110/74  Peak VO2: 10.9 (43.4% predicted peak VO2)  VE/VCO2 slope: 48.3  OUES: 1.14  Peak RER: 1.11  Ventilatory Threshold: 7.1 (28.3% predicted peak VO2)  Peak RR 28  Peak Ventilation: 50.7  VE/MVV: 42.5%  PETCO2 at peak: 28  O2pulse: 10 (62% predicted O2pulse)   Interpretation  Notes: Patient gave a very good effort. The pulse-oximetry remained at 96% or greater throughout the exercise.  ECG: Resting ECG in sinus rhythm with LBBB and occasional PVC. Considering the presence of beta-blockade, there was a borderline adequate HR response to the exercise with occasional, isolated, PVCs and no sustained arrhythmias. The BP increased initially with exercise reaching 124/80, but dropped at peak exercise to 110/74.   PFT: Resting spirometry demonstrates a mild obstructive pattern. The MVV was normal.  CPX: Exercise testing with gas exchange demonstrates a severely reduced peak VO2 of 10.9 ml/kg/min (43.4% of the age/gender/weight matched sedentary norms). The RER of  1.11 indicates a maximal effort. The VE/VCO2 slope is grossly elevated indicating excessive dead space ventilation and very poor ventilatory efficiency. The oxygen uptake efficiency slope (OUES) is severely reduced and reflects the measured exercise capacity. The VO2 at the ventilatory threshold was well below normal at 28% of the predicted peak VO2. At peak exercise, the ventilation reached 42% of the measured MVV indicating ventilatory reserve remained (respiratory rate was below the expected range, the Vt/IC [51%] was below the expected range, PETCO2 was well below normal). The O2pulse (a surrogate for stroke volume) increased throughout the exercise reaching 10 ml/beat (62% of predicted).   Conclusion: Exercise testing with gas exchange demonstrates a severe functional limitation when compared to matched sedentary norms. This severe limitation is related to the patient's advanced heart failure. ^^^Preliminary CPX Results, Finalized results will be forwarded when completed by interpreting physician.^^^  Report Prepared by: Almedia Balls, MEd, ACSM-RCEP Sr. Exercise Physiologist 09/16/2013 4:57 PM   *[facility]* *[facility]* [address] [place], [place] [address] [phone]  ------------------------------------------------------------ Transthoracic Echocardiography  Patient: [name], [name] MR #: [id number] Study Date: 09/07/2013 Gender: M Age: 15 Height: 182.9cm Weight: 85.9kg BSA: 2.73m^2 Pt. Status: Room: E45C  PERFORMING [place], [facility] SONOGRAPHER Cathie Beams ATTENDING Sherryl Manges ADMITTING [place], Wayna Chalet cc:  ------------------------------------------------------------ LV EF: 20% - 25%  ------------------------------------------------------------ Indications: Previous study 06/05/2013. CHF - 428.0. Ventricular tachycardia  427.1",.  ------------------------------------------------------------ History: PMH: Defibrillator. Ischemic cardiomyopathy. Renal insufficiency. Peripheral vascular disease. Abdominal aortic aneurysm. Coronary artery disease. Risk  factors: Hypertension. Dyslipidemia.  ------------------------------------------------------------ Study Conclusions  - Left ventricle: The cavity size was severely dilated. Wall thickness was normal. Systolic function was severely reduced. The estimated ejection fraction was in the range of 20% to 25%. Diffuse hypokinesis. There is akinesis of the inferior, posterior and lateral myocardium. Features are consistent with a pseudonormal left ventricular filling pattern, with concomitant abnormal relaxation and increased filling pressure (grade 2 diastolic dysfunction). Doppler parameters are consistent with high ventricular filling pressure. - Aortic valve: Mild regurgitation. - Mitral valve: Mild regurgitation. - Left atrium: The atrium was moderately dilated. - Right ventricle: Systolic function was mildly reduced. - Pulmonary arteries: PA peak pressure: 31mm Hg (S). Transthoracic echocardiography. M-mode, complete 2D, spectral Doppler, and color Doppler. Height: Height: 182.9cm. Height: 72in. Weight: Weight: 85.9kg. Weight: 189lb. Body mass index: BMI: 25.7kg/m^2. Body surface area: BSA: 2.14m^2. Blood pressure: 124/69. Patient status: Inpatient. Location: Echo laboratory.  ------------------------------------------------------------  ------------------------------------------------------------ Left ventricle: The cavity size was severely dilated. Wall thickness was normal. Systolic function was severely reduced. The estimated ejection fraction was in the range of 20% to 25%. Diffuse hypokinesis. Regional wall motion abnormalities: There is akinesis of the inferior, posterior and lateral myocardium. Features are consistent with a pseudonormal left  ventricular filling pattern, with concomitant abnormal relaxation and increased filling pressure (grade 2 diastolic dysfunction). Doppler parameters are consistent with high ventricular filling pressure.  ------------------------------------------------------------ Aortic valve: Trileaflet; mildly calcified leaflets. Mobility was not restricted. Doppler: Transvalvular velocity was within the normal range. There was no stenosis. Mild regurgitation.  ------------------------------------------------------------ Aorta: Aortic root: The aortic root was mildly dilated.  ------------------------------------------------------------ Mitral valve: Structurally normal valve. Mobility was not restricted. Doppler: Transvalvular velocity was within the normal range. There was no evidence for stenosis. Mild regurgitation.  ------------------------------------------------------------ Left atrium: The atrium was moderately dilated.  ------------------------------------------------------------ Right ventricle: The cavity size was normal. Pacer wire or catheter noted in right ventricle. Systolic function was mildly reduced.  ------------------------------------------------------------ Pulmonic valve: Doppler: Transvalvular velocity was within the normal range. There was no evidence for stenosis.  ------------------------------------------------------------ Tricuspid valve: Structurally normal valve. Doppler: Transvalvular velocity was within the normal range. Trivial regurgitation.  ------------------------------------------------------------ Pulmonary artery: Systolic pressure was within the normal range.  ------------------------------------------------------------ Right atrium: The atrium was normal in size. Pacer wire or catheter noted in right atrium.  ------------------------------------------------------------ Pericardium: There was no pericardial  effusion.  ------------------------------------------------------------ Systemic veins: Inferior vena cava: The vessel was normal in size.  ------------------------------------------------------------  2D measurements Normal Doppler Normal Left ventricle measurements LVID ED, 69.5 mm 43-52 Main pulmonary chord, artery PLAX Pressure, S 31 mm =30 LVID ES, 67.8 mm 23-38 Hg chord, Left ventricle PLAX Ea, lat 2.85 cm/ ------- FS, chord, 2 % >29 ann, tiss s PLAX DP LVPW, ED 10.1 mm ------ E/Ea, lat 23.89 ------- IVS/LVPW 0.93 <1.3 ann, tiss ratio, ED DP Ventricular septum Ea, med 3.58 cm/ ------- IVS, ED 9.38 mm ------ ann, tiss s Aorta DP Root diam, 39 mm ------ E/Ea, med 19.02 ------- ED ann, tiss Left atrium DP AP dim 53 mm ------ Aortic valve AP dim 2.53 cm/m^2 <2.2 Regurg peak 296 cm/ ------- index vel s Regurg PHT 519 ms ------- Regurg peak 35 mm ------- gradient Hg Mitral valve Peak E vel 68.1 cm/ ------- s Peak A vel 44.4 cm/ ------- s Deceleratio 236 ms 150-230 n time Peak E/A 1.5 ------- ratio Tricuspid valve Regurg peak 230 cm/ ------- vel s Peak RV-RA 21 mm ------- gradient, S Hg Systemic veins Estimated 10 mm ------- CVP  Hg Right ventricle Pressure, S 31 mm <30 Hg Sa vel, lat 5.87 cm/ ------- ann, tiss s DP  ------------------------------------------------------------ Prepared and Electronically Authenticated by  Olga Millers 2014-11-03T13:31:41.100   Cardiac Cath Procedure Note:  Indication: HF  Procedures performed:  1) Right heart catheterization  Description of procedure:   The risks and indication of the procedure were explained. Consent was signed and placed on the chart. An appropriate timeout was taken prior to the procedure. The right groin was prepped and draped in the routine sterile fashion and anesthetized with 1% local lidocaine.  A 7 FR venous sheath was placed in the right femoral vein using a modified Seldinger  technique. A standard Swan-Ganz catheter was used for the procedure.  Complications: None apparent.  Findings:  RA = 4  RV = 42/4/5  PA = 44/22 (31)  PCW = 19 v = 24  Fick cardiac output/index = 5.45/2.6  Thermo CO/CI = 3.8/1.8  PVR = 2.2 WU (Fick)  FA sat = 92%  PA sat = 60%, 60%  SVC = 61%  Assessment:  1. Well compensated filling pressures  2. Normal cardiac output by Fick but depressed cardiac output by thermodilution  3. No evidence of intracardiac shunt  Plan/Discussion:  Suspect Fick may be overestimated due to O2 consumption estimation. Proceed with VAD work-up.  Daniel Bensimhon  9:30 AM     Assessment/Plan:  This gentleman has chronic systolic heart failure with NYHA class IIIB symptoms and an EF of 20-25% due to mixed NICM/ICM. His CPX test shows marked functional limitation due to advanced heart failure. His right heart cath shows well-compensated pressures and fairly normal CVP and 2D echo shows mild RV dysfunction with no other valvular abnormalities. An LVAD is really the only option for improving his functional state and he should be considered for LVAD as destination therapy. Will begin workup and discuss further with the team.  Alleen Borne  09/17/2013

## 2013-09-20 NOTE — Progress Notes (Signed)
Pt Temp 102.1 oral, Dr Jones Broom notified orders received for blood cultures, urine culture and Rocephin and Vancomycin per pharmacy.  Tylenol given.

## 2013-09-21 DIAGNOSIS — I359 Nonrheumatic aortic valve disorder, unspecified: Secondary | ICD-10-CM

## 2013-09-21 DIAGNOSIS — I5023 Acute on chronic systolic (congestive) heart failure: Secondary | ICD-10-CM

## 2013-09-21 DIAGNOSIS — I509 Heart failure, unspecified: Secondary | ICD-10-CM

## 2013-09-21 LAB — CBC WITH DIFFERENTIAL/PLATELET
Basophils Absolute: 0 10*3/uL (ref 0.0–0.1)
Eosinophils Relative: 3 % (ref 0–5)
HCT: 34 % — ABNORMAL LOW (ref 39.0–52.0)
Lymphocytes Relative: 14 % (ref 12–46)
Lymphs Abs: 0.5 10*3/uL — ABNORMAL LOW (ref 0.7–4.0)
MCV: 97.7 fL (ref 78.0–100.0)
Monocytes Absolute: 0.4 10*3/uL (ref 0.1–1.0)
Monocytes Relative: 12 % (ref 3–12)
Neutro Abs: 2.5 10*3/uL (ref 1.7–7.7)
Platelets: 94 10*3/uL — ABNORMAL LOW (ref 150–400)
RBC: 3.48 MIL/uL — ABNORMAL LOW (ref 4.22–5.81)
WBC: 3.5 10*3/uL — ABNORMAL LOW (ref 4.0–10.5)

## 2013-09-21 LAB — LUPUS ANTICOAGULANT PANEL
DRVVT: 35.4 secs (ref <42.9)
Lupus Anticoagulant: NOT DETECTED
PTT Lupus Anticoagulant: 39.3 secs (ref 28.0–43.0)

## 2013-09-21 LAB — COMPREHENSIVE METABOLIC PANEL
ALT: 134 U/L — ABNORMAL HIGH (ref 0–53)
AST: 163 U/L — ABNORMAL HIGH (ref 0–37)
Alkaline Phosphatase: 138 U/L — ABNORMAL HIGH (ref 39–117)
BUN: 21 mg/dL (ref 6–23)
CO2: 25 mEq/L (ref 19–32)
Calcium: 8.7 mg/dL (ref 8.4–10.5)
Chloride: 97 mEq/L (ref 96–112)
GFR calc Af Amer: 42 mL/min — ABNORMAL LOW (ref >90)
GFR calc non Af Amer: 37 mL/min — ABNORMAL LOW (ref >90)
Glucose, Bld: 96 mg/dL (ref 70–99)
Sodium: 133 mEq/L — ABNORMAL LOW (ref 135–145)
Total Bilirubin: 0.6 mg/dL (ref 0.3–1.2)

## 2013-09-21 LAB — FACTOR 5 LEIDEN

## 2013-09-21 MED ORDER — BOOST / RESOURCE BREEZE PO LIQD
1.0000 | Freq: Two times a day (BID) | ORAL | Status: DC
  Administered 2013-09-22 – 2013-09-29 (×14): 1 via ORAL

## 2013-09-21 MED ORDER — PIPERACILLIN-TAZOBACTAM 3.375 G IVPB
3.3750 g | Freq: Three times a day (TID) | INTRAVENOUS | Status: DC
  Administered 2013-09-21 – 2013-09-22 (×2): 3.375 g via INTRAVENOUS
  Filled 2013-09-21 (×5): qty 50

## 2013-09-21 MED ORDER — PIPERACILLIN-TAZOBACTAM 3.375 G IVPB 30 MIN
3.3750 g | Freq: Once | INTRAVENOUS | Status: AC
  Administered 2013-09-21: 3.375 g via INTRAVENOUS
  Filled 2013-09-21: qty 50

## 2013-09-21 NOTE — Progress Notes (Signed)
ANTIBIOTIC CONSULT NOTE - FOLLOW UP  Pharmacy Consult for zosyn Indication: empiric coverage  Allergies  Allergen Reactions  . Meperidine Hcl Other (See Comments)    "CAN'T MOVE" CATATONIC PER PT.  [name] Opium Other (See Comments)    "CAN'T MOVE" CATATONIC PER PT.    Patient Measurements: Height: 6' (182.9 cm) Weight: 192 lb 7.4 oz (87.3 kg) IBW/kg (Calculated) : 77.6  Vital Signs: Temp: 98 F (36.7 C) (11/17 0753) Temp src: Oral (11/17 0753) BP: 90/54 mmHg (11/17 0700) Pulse Rate: 70 (11/17 0700) Intake/Output from previous day: 11/16 0701 - 11/17 0700 In: 1070 [P.O.:720; IV Piggyback:350] Out: 1650 [Urine:1650] Intake/Output from this shift:    Labs:  Recent Labs  09/18/13 1525 09/20/13 1200 09/21/13 0430  WBC  --  4.4 3.5*  HGB 12.6* 12.2* 11.5*  PLT  --  112* 94*  CREATININE 1.70* 1.74* 1.76*   Estimated Creatinine Clearance: 41 ml/min (by C-G formula based on Cr of 1.76). No results found for this basename: VANCOTROUGH, VANCOPEAK, VANCORANDOM, GENTTROUGH, GENTPEAK, GENTRANDOM, TOBRATROUGH, TOBRAPEAK, TOBRARND, AMIKACINPEAK, AMIKACINTROU, AMIKACIN,  in the last 72 hours    Assessment: 71 yo with concern for sepsis on antibiotics and patient noted with continued chills/sweats to broaden coverage to zosyn.  Zosyn 11/17>> Vancomycin 11/15> Rocephin 11/15> 11/17  BC x 2 11/15> Urine 11/15> neg   Goal of Therapy:  Vancomycin trough level 15-20 mcg/ml  Plan:  -Zosyn 3.375gm IV q8h -Will follow renal function, cultures and clinical progress  [name], Pharm D 09/21/2013 8:29 AM

## 2013-09-21 NOTE — Progress Notes (Signed)
Transferred to 2H22 via wheelchair. Portable monitor on. No changes.

## 2013-09-21 NOTE — Progress Notes (Signed)
NUTRITION CONSULT/FOLLOW UP  Intervention:    Resource Breeze twice daily (250 kcals, 9 gm protein per 8 fl oz bottle) RD to follow for nutrition care plan  New Nutrition Dx:   Increased nutrient needs related to concern for sepsis as evidenced by estimated nutrition needs, ongoing  Goal:   Pt to meet >/= 90% of their estimated nutrition needs, progressing  Monitor:   PO & supplemental intake, weight, labs, I/O's  Assessment:   Patient with PMH of CAD, HTN, chronic systolic HF and stroke; presented with acute onset of chest pain; episode began while patient was asleep early AM of 11/11 and persisted for several hours; in ED was tachycardic.   Patient s/p procedure 11/13:  RIGHT HEART CATHETERIZATION  Patient schedule for LVAD procedure in December 2014.  RD re-consulted for "possible wound healing issues due to low prealbumin".  Noted patient with chills, nausea and vomiting 11/16 AM.  PO intake now variable at 50-100% per flowsheet records.  Would benefit from addition of nutrition supplement -- RD to order.  Height: Ht Readings from Last 1 Encounters:  09/15/13 6' (1.829 m)    Weight Status:   Wt Readings from Last 1 Encounters:  09/17/13 192 lb 7.4 oz (87.3 kg)    Body mass index is 26.1 kg/(m^2).  Re-estimated needs:  Kcal: 2100-2300 Protein: 110-120 gm Fluid: 2.1-2.3 L  Skin: Intact  Diet Order: Cardiac   Intake/Output Summary (Last 24 hours) at 09/21/13 1200 Last data filed at 09/21/13 0826  Gross per 24 hour  Intake   1190 ml  Output   1150 ml  Net     40 ml    Labs:   Recent Labs Lab 09/16/13 0415  09/18/13 1525 09/20/13 1200 09/21/13 0430  NA 138  --  134* 134* 133*  K 4.2  --  4.1 4.4 4.6  CL 104  --  106 99 97  CO2 25  --   --  24 25  BUN 24*  --  19 20 21   CREATININE 1.67*  < > 1.70* 1.74* 1.76*  CALCIUM 8.6  --   --  8.8 8.7  GLUCOSE 99  --  111* 84 96  < > = values in this interval not displayed.   Scheduled Meds: . amiodarone   400 mg Oral Daily  . aspirin EC  81 mg Oral Daily  . atorvastatin  80 mg Oral Daily  . heparin  5,000 Units Subcutaneous Q8H  . levothyroxine  112 mcg Oral QAC breakfast  . piperacillin-tazobactam (ZOSYN)  IV  3.375 g Intravenous Q8H  . quiniDINE gluconate  324 mg Oral Q12H  . sodium chloride  3 mL Intravenous Q12H  . vancomycin  750 mg Intravenous Q12H    Continuous Infusions:   Maureen Chatters, RD, LDN Pager #: [phone] After-Hours Pager #: [phone]

## 2013-09-21 NOTE — Progress Notes (Signed)
Transferred -in from 2300 by wheelchair awake and alert.

## 2013-09-21 NOTE — Progress Notes (Addendum)
Advanced Heart Failure Rounding Note   Subjective:    [name] is a 72 yo man with an ischemic CM s/p ICD implant, chronic systolic HF (EF 20-25%), paroxysmal VT s/p ablation (09/2011) and repeat (06/2013), CAD, PVD (s/p R fem pop BPG 01/2010; s/p L CFA BPG 05/2002), AAA (s/p repair 04/2002), CAS (s/p L CEA 2008), HTN, arthritis, and TIA.   ECHO 09/2013: EF 20-25%, mild AI, mild MR, LA mildly dilated and RV systolic fx mildly reduced.   Over the past 2 months has experienced multiple episodes of Vtach with ICD shocks.He was last admitted to the hosptial 09/06/13-09/09/13 for recurrent VT s/p ICD shock and Qunidine was started. He was supposed to have a CPX test on the OP side and be seen in the HF clinic how presented back to the ED 09/15/13 with CP and was found to be in Vtach rate of 113, which he was cardioverted successfully.   CPX 09/16/13 Peak VO2 10.9 VE/VCO2 48 RER 1.11 Ve/MVV 43%  RHC 11/13 RA = 4  RV = 42/4/5  PA = 44/22 (31)  PCW = 19 v = 24  Fick cardiac output/index = 5.45/2.6  Thermo CO/CI = 3.8/1.8  PVR = 2.2 WU (Fick)  FA sat = 92%  PA sat = 60%, 60%  SVC = 61%  Developed severe hypotension with fevers and chills on 11/15 concerning for sepsis. Cultures drawn. Started vanc and ceftriaxone. Flu titers negative. Tmax = 99.8 BCX - NGTD. BP still soft. Continues with severe sweats and occasional chills.    Objective:   Weight Range:  Vital Signs:   Temp:  [97.9 F (36.6 C)-99.8 F (37.7 C)] 99.6 F (37.6 C) (11/17 0400) Pulse Rate:  [69-75] 70 (11/17 0700) Resp:  [11-25] 14 (11/17 0700) BP: (85-137)/(47-71) 90/54 mmHg (11/17 0700) SpO2:  [92 %-100 %] 99 % (11/17 0700) Last BM Date: 09/20/13  Weight change: Filed Weights   09/15/13 0403 09/16/13 0800 09/17/13 0600  Weight: 86.183 kg (190 lb) 87.1 kg (192 lb 0.3 oz) 87.3 kg (192 lb 7.4 oz)    Intake/Output:   Intake/Output Summary (Last 24 hours) at 09/21/13 0751 Last data filed at 09/21/13 0600  Gross per 24 hour  Intake   1070 ml  Output   1650 ml  Net   -580 ml     Physical Exam: General: Fatigued appearing. Damp. No resp difficulty; lying in bed  HEENT: normal; L CEA scar Neck: supple. JVP flat . Carotids 2+ bilat; no bruits. No lymphadenopathy or thryomegaly appreciated.  Cor: PMI nondisplaced. Regular rate & rhythm. No rubs, gallops or murmurs.  Lungs: clear  Abdomen: soft, nontender, nondistended. No hepatosplenomegaly. No bruits or masses. Good bowel sounds.  Extremities: no cyanosis, clubbing, rash, edema  Neuro: alert & orientedx3, cranial nerves grossly intact. moves all 4 extremities w/o difficulty. Affect pleasant  Telemetry: A paced 80s  Labs: Basic Metabolic Panel:  Recent Labs Lab 09/15/13 0425 09/16/13 0415 09/17/13 1335 09/18/13 1525 09/20/13 1200 09/21/13 0430  NA 141 138  --  134* 134* 133*  K 5.0 4.2  --  4.1 4.4 4.6  CL 105 104  --  106 99 97  CO2 25 25  --   --  24 25  GLUCOSE 116* 99  --  111* 84 96  BUN 27* 24*  --  19 20 21   CREATININE 2.01* 1.67* 1.52* 1.70* 1.74* 1.76*  CALCIUM 9.4 8.6  --   --  8.8 8.7  Liver Function Tests:  Recent Labs Lab 09/20/13 1200 09/21/13 0430  AST 100* 163*  ALT 82* 134*  ALKPHOS 131* 138*  BILITOT 0.6 0.6  PROT 6.6 6.4  ALBUMIN 3.2* 3.1*   No results found for this basename: LIPASE, AMYLASE,  in the last 168 hours No results found for this basename: AMMONIA,  in the last 168 hours  CBC:  Recent Labs Lab 09/15/13 0425 09/16/13 0415 09/17/13 1335 09/18/13 1525 09/20/13 1200 09/21/13 0430  WBC 5.8 5.7 6.1  --  4.4 3.5*  NEUTROABS  --   --   --   --  3.4 2.5  HGB 12.2* 11.9* 12.1* 12.6* 12.2* 11.5*  HCT 37.4* 35.5* 36.2* 37.0* 36.9* 34.0*  MCV 100.5* 98.6 98.6  --  98.9 97.7  PLT 124* 124* 137*  --  112* 94*    Cardiac Enzymes:  Recent Labs Lab 09/15/13 0750 09/15/13 1908  TROPONINI <0.30 <0.30    BNP: BNP (last 3 results)  Recent Labs  11/23/12 0901 09/15/13 0424  09/15/13 0750  PROBNP 1891.0* 2704.0* 3038.0*    Imaging: No results found.   Medications:     Scheduled Medications: . amiodarone  400 mg Oral Daily  . aspirin EC  81 mg Oral Daily  . atorvastatin  80 mg Oral Daily  . carvedilol  3.125 mg Oral BID WC  . cefTRIAXone (ROCEPHIN)  IV  1 g Intravenous Q24H  . clopidogrel  75 mg Oral Daily  . furosemide  20 mg Oral Daily  . heparin  5,000 Units Subcutaneous Q8H  . levothyroxine  112 mcg Oral QAC breakfast  . quiniDINE gluconate  324 mg Oral Q12H  . sodium chloride  3 mL Intravenous Q12H  . vancomycin  750 mg Intravenous Q12H    Infusions:    PRN Medications: acetaminophen, acetaminophen, nitroGLYCERIN, ondansetron (ZOFRAN) IV, sodium chloride   Assessment:   1) Recurrent VTach, s/p ICD shock  --ablation 09/2011 and 06/2013  2) Chronic systolic HF, EF 20-25% (09/2013)  3) Mixed NICM/ICM  4) CAD  --s/p previous inferolateral MI.  --Chronically occluded RCA with L-> R collaterals per cath 1/14  5) PAD, severe  6) HTN  7) LBBB 8) Sepsis 9) Hyponatremia  Plan/Discussion:    Continues with severe infectious symptoms though fever is coming down. No leukocytosis. Cultures pending. Will expand to vanc/zosyn. Check echo. May need TEE to look at ICD leads especially if grows staph or enterococcus. Symptoms actually correspond with starting quinidine but doubt it is cause.   Plan will be for VAD placement after Thanksgiving.   No VT on tele. Hold lasix.   Can go to SDU  Truman Hayward 7:51 AM

## 2013-09-21 NOTE — Progress Notes (Signed)
CARDIAC REHAB PHASE I   PRE:  Rate/Rhythm: 74 SR  BP:  Supine: 123/62  Sitting:   Standing:    SaO2: 99 2L 97 RA  MODE:  Ambulation: 100 ft   POST:  Rate/Rhythm: 86 SR  BP:  Supine: 105/59  Sitting:   Standing:    SaO2: 96 RA  1400-1435 On arrival pt in bed. States that he had walked on 2 south several times without difficulty. O2 d/c before walk sat 97% RA. Assisted X 1 and used walker to ambulate. Pt gait steady with walker. He walked 100 feet and started leaning to the right and forward. I called for a chair and sat pt down. He was responsive and denied any dizziness. I ask jim what happened and he stated that he could feel himself going to the right but could not stop it. Rolled pt back to room in w/c. HR was in the 80's SR RA sat 96% in hall. Placed pt back in bed with call light in reach and placed O2 on 2L.Pt without c/o. Reported to pt's nurse.  Melina Copa RN 09/21/2013 2:35 PM

## 2013-09-21 NOTE — Progress Notes (Signed)
Thank you for consulting the Palliative Medicine Team at [facility] to meet your patient's and family's needs.   The reason that you asked [place] to see your patient is  For Preparedness Plan discussion related to anticipated LVAD placement  We have scheduled your patient for a meeting:  Tuesday 09-22-13 @ 1700  The Surrogate decision make is: Trina Ao Contact information:C# [phone]  Other family members that need to be present: Patient does not want to include daughter    Your patient is able to participate: yes  Lorinda Creed NP  Palliative Medicine Team Team Phone # [phone] Pager [phone]

## 2013-09-21 NOTE — Progress Notes (Signed)
  Echocardiogram 2D Echocardiogram has been performed.  Georgian Co 09/21/2013, 11:53 AM

## 2013-09-22 ENCOUNTER — Telehealth: Payer: Self-pay | Admitting: Licensed Clinical Social Worker

## 2013-09-22 ENCOUNTER — Encounter (HOSPITAL_COMMUNITY): Payer: Medicare Other

## 2013-09-22 DIAGNOSIS — Z515 Encounter for palliative care: Secondary | ICD-10-CM

## 2013-09-22 DIAGNOSIS — I359 Nonrheumatic aortic valve disorder, unspecified: Secondary | ICD-10-CM

## 2013-09-22 DIAGNOSIS — R5381 Other malaise: Secondary | ICD-10-CM

## 2013-09-22 DIAGNOSIS — Z01810 Encounter for preprocedural cardiovascular examination: Secondary | ICD-10-CM

## 2013-09-22 LAB — CBC WITH DIFFERENTIAL/PLATELET
Basophils Absolute: 0 10*3/uL (ref 0.0–0.1)
Basophils Relative: 1 % (ref 0–1)
Eosinophils Absolute: 0.2 10*3/uL (ref 0.0–0.7)
Eosinophils Relative: 4 % (ref 0–5)
HCT: 35.6 % — ABNORMAL LOW (ref 39.0–52.0)
Hemoglobin: 12 g/dL — ABNORMAL LOW (ref 13.0–17.0)
Lymphocytes Relative: 14 % (ref 12–46)
MCH: 33 pg (ref 26.0–34.0)
MCHC: 33.7 g/dL (ref 30.0–36.0)
MCV: 97.8 fL (ref 78.0–100.0)
Monocytes Absolute: 0.6 10*3/uL (ref 0.1–1.0)
Monocytes Relative: 16 % — ABNORMAL HIGH (ref 3–12)
Neutro Abs: 2.6 10*3/uL (ref 1.7–7.7)
RDW: 14.8 % (ref 11.5–15.5)
WBC: 4 10*3/uL (ref 4.0–10.5)

## 2013-09-22 LAB — VANCOMYCIN, TROUGH: Vancomycin Tr: 12.3 ug/mL (ref 10.0–20.0)

## 2013-09-22 LAB — COMPREHENSIVE METABOLIC PANEL
AST: 221 U/L — ABNORMAL HIGH (ref 0–37)
Albumin: 3 g/dL — ABNORMAL LOW (ref 3.5–5.2)
BUN: 18 mg/dL (ref 6–23)
CO2: 28 mEq/L (ref 19–32)
Calcium: 8.9 mg/dL (ref 8.4–10.5)
Chloride: 100 mEq/L (ref 96–112)
Creatinine, Ser: 1.56 mg/dL — ABNORMAL HIGH (ref 0.50–1.35)
GFR calc non Af Amer: 42 mL/min — ABNORMAL LOW (ref >90)
Glucose, Bld: 100 mg/dL — ABNORMAL HIGH (ref 70–99)
Total Protein: 6.3 g/dL (ref 6.0–8.3)

## 2013-09-22 LAB — SEDIMENTATION RATE: Sed Rate: 15 mm/hr (ref 0–16)

## 2013-09-22 MED ORDER — SODIUM CHLORIDE 0.9 % IV BOLUS (SEPSIS)
500.0000 mL | Freq: Once | INTRAVENOUS | Status: AC
  Administered 2013-09-22: 500 mL via INTRAVENOUS

## 2013-09-22 MED ORDER — SODIUM CHLORIDE 0.9 % IV SOLN
INTRAVENOUS | Status: DC
  Administered 2013-09-24: 10:00:00 via INTRAVENOUS

## 2013-09-22 MED ORDER — FENTANYL CITRATE 0.05 MG/ML IJ SOLN: 25.0000 ug | Freq: Once | INTRAMUSCULAR | Status: DC

## 2013-09-22 MED ORDER — SODIUM CHLORIDE 0.9 % IV SOLN
500.0000 mg | Freq: Three times a day (TID) | INTRAVENOUS | Status: DC
  Administered 2013-09-22 – 2013-09-23 (×3): 500 mg via INTRAVENOUS
  Filled 2013-09-22 (×5): qty 500

## 2013-09-22 MED ORDER — MIDAZOLAM HCL 2 MG/2ML IJ SOLN
INTRAMUSCULAR | Status: AC
  Administered 2013-09-22: 2 mg
  Filled 2013-09-22: qty 6

## 2013-09-22 MED ORDER — VANCOMYCIN HCL IN DEXTROSE 1-5 GM/200ML-% IV SOLN
1000.0000 mg | Freq: Two times a day (BID) | INTRAVENOUS | Status: DC
  Administered 2013-09-22: 1000 mg via INTRAVENOUS
  Filled 2013-09-22 (×3): qty 200

## 2013-09-22 MED ORDER — MIDAZOLAM HCL 2 MG/2ML IJ SOLN
1.0000 mg | Freq: Once | INTRAMUSCULAR | Status: AC
  Administered 2013-09-22: 1 mg via INTRAVENOUS

## 2013-09-22 MED ORDER — MIDAZOLAM HCL 2 MG/2ML IJ SOLN: 2.0000 mg | Freq: Once | INTRAMUSCULAR | Status: DC

## 2013-09-22 MED ORDER — FENTANYL CITRATE 0.05 MG/ML IJ SOLN
INTRAMUSCULAR | Status: AC
  Administered 2013-09-22: 25 ug
  Filled 2013-09-22: qty 2

## 2013-09-22 NOTE — Progress Notes (Signed)
ANTIBIOTIC CONSULT NOTE   Pharmacy Consult for vancomycin/primaxin Indication: empiric coverage  Allergies  Allergen Reactions  . Meperidine Hcl Other (See Comments)    "CAN'T MOVE" CATATONIC PER PT.  [name] Opium Other (See Comments)    "CAN'T MOVE" CATATONIC PER PT.    Patient Measurements: Height: 6' (182.9 cm) Weight: 188 lb 12.8 oz (85.639 kg) IBW/kg (Calculated) : 77.6  Vital Signs: Temp: 98.5 F (36.9 C) (11/18 0544) Temp src: Oral (11/18 0544) BP: 115/70 mmHg (11/18 0544) Pulse Rate: 70 (11/18 0337) Intake/Output from previous day: 11/17 0701 - 11/18 0700 In: 990 [P.O.:590; IV Piggyback:400] Out: 1400 [Urine:1400] Intake/Output from this shift:    Labs:  Recent Labs  09/20/13 1200 09/21/13 0430 09/22/13 0817  WBC 4.4 3.5* 4.0  HGB 12.2* 11.5* 12.0*  PLT 112* 94* 105*  CREATININE 1.74* 1.76*  --    Estimated Creatinine Clearance: 41 ml/min (by C-G formula based on Cr of 1.76). No results found for this basename: VANCOTROUGH, VANCOPEAK, VANCORANDOM, GENTTROUGH, GENTPEAK, GENTRANDOM, TOBRATROUGH, TOBRAPEAK, TOBRARND, AMIKACINPEAK, AMIKACINTROU, AMIKACIN,  in the last 72 hours    Assessment: 72 yo with concern for sepsis/bacteremia on antibiotics and patient noted with fever overnight continues to complain of chills/sweats, wbc normal will broaden antibiotics to primaxin this morning. Vancomycin level drawn this morning was slightly below goal for bacteremia.   TEE negative for vegetations/endocarditis.  Zosyn 11/17>>11/18 Primaxin 11/18>> Vancomycin 11/15> Rocephin 11/15> 11/17  BC x 2 11/15>ngtd Urine 11/15> neg 11/18 bld x2 -  11/16 Resp virus panel - none detected   Goal of Therapy:  Vancomycin trough level 15-20 mcg/ml  Plan:  Increase vancomycin to 1000mg  q12 hours - start tonight Primaxin 500mg  IV q8 hours Follow culture data, fever curve and renal function  Sheppard Coil PharmD., BCPS Clinical Pharmacist Pager [phone] 09/22/2013 1:12  PM

## 2013-09-22 NOTE — Progress Notes (Addendum)
Advanced Heart Failure Rounding Note   Subjective:    [name] is a 72 yo man with an ischemic CM s/p ICD implant, chronic systolic HF (EF 20-25%), paroxysmal VT s/p ablation (09/2011) and repeat (06/2013), CAD, PVD (s/p R fem pop BPG 01/2010; s/p L CFA BPG 05/2002), AAA (s/p repair 04/2002), CAS (s/p L CEA 2008), HTN, arthritis, and TIA.   ECHO 09/2013: EF 20-25%, mild AI, mild MR, LA mildly dilated and RV systolic fx mildly reduced.   Over the past 2 months has experienced multiple episodes of Vtach with ICD shocks.He was last admitted to the hosptial 09/06/13-09/09/13 for recurrent VT s/p ICD shock and Qunidine was started. He was supposed to have a CPX test on the OP side and be seen in the HF clinic how presented back to the ED 09/15/13 with CP and was found to be in Vtach rate of 113, which he was cardioverted successfully.   CPX 09/16/13 Peak VO2 10.9 VE/VCO2 48 RER 1.11 Ve/MVV 43%  RHC 11/13 RA = 4  RV = 42/4/5  PA = 44/22 (31)  PCW = 19 v = 24  Fick cardiac output/index = 5.45/2.6  Thermo CO/CI = 3.8/1.8  PVR = 2.2 WU (Fick)  FA sat = 92%  PA sat = 60%, 60%  SVC = 61%  Developed severe hypotension with fevers and chills on 11/15 concerning for sepsis. Cultures drawn.  BCX remain NGTD. Early this am had another fever > T max 102.4 . Had chills an sweating. Also had one episode of vomiting    ECHO EF 20-25% RV mildly dilated  Peak PA pressure 61  Urine Culture- No growth Nasal Swab Negative for influenza.  Objective:   Weight Range:  Vital Signs:   Temp:  [98 F (36.7 C)-102.4 F (39.1 C)] 98.5 F (36.9 C) (11/18 0544) Pulse Rate:  [69-75] 70 (11/18 0337) Resp:  [10-23] 13 (11/18 0600) BP: (90-132)/(50-71) 115/70 mmHg (11/18 0544) SpO2:  [93 %-100 %] 98 % (11/18 0544) Weight:  [188 lb 12.8 oz (85.639 kg)] 188 lb 12.8 oz (85.639 kg) (11/18 0545) Last BM Date: 09/20/13  Weight change: Filed Weights   09/16/13 0800 09/17/13 0600 09/22/13 0545  Weight: 192 lb  0.3 oz (87.1 kg) 192 lb 7.4 oz (87.3 kg) 188 lb 12.8 oz (85.639 kg)    Intake/Output:   Intake/Output Summary (Last 24 hours) at 09/22/13 0644 Last data filed at 09/22/13 0400  Gross per 24 hour  Intake    990 ml  Output   1400 ml  Net   -410 ml     Physical Exam: General: Fatigued appearing. Damp. No resp difficulty; lying in bed  HEENT: normal; L CEA scar Neck: supple. JVP 7. Carotids 2+ bilat; no bruits. No lymphadenopathy or thryomegaly appreciated.  Cor: PMI nondisplaced. Regular rate & rhythm. No rubs, gallops or murmurs.  Lungs: clear  Abdomen: soft, nontender, nondistended. No hepatosplenomegaly. No bruits or masses. Good bowel sounds.  Extremities: no cyanosis, clubbing, rash, edema  Neuro: alert & orientedx3, cranial nerves grossly intact. moves all 4 extremities w/o difficulty. Affect pleasant  Telemetry: A paced 70s  Labs: Basic Metabolic Panel:  Recent Labs Lab 09/16/13 0415 09/17/13 1335 09/18/13 1525 09/20/13 1200 09/21/13 0430  NA 138  --  134* 134* 133*  K 4.2  --  4.1 4.4 4.6  CL 104  --  106 99 97  CO2 25  --   --  24 25  GLUCOSE 99  --  111* 84 96  BUN 24*  --  19 20 21   CREATININE 1.67* 1.52* 1.70* 1.74* 1.76*  CALCIUM 8.6  --   --  8.8 8.7    Liver Function Tests:  Recent Labs Lab 09/20/13 1200 09/21/13 0430  AST 100* 163*  ALT 82* 134*  ALKPHOS 131* 138*  BILITOT 0.6 0.6  PROT 6.6 6.4  ALBUMIN 3.2* 3.1*   No results found for this basename: LIPASE, AMYLASE,  in the last 168 hours No results found for this basename: AMMONIA,  in the last 168 hours  CBC:  Recent Labs Lab 09/16/13 0415 09/17/13 1335 09/18/13 1525 09/20/13 1200 09/21/13 0430  WBC 5.7 6.1  --  4.4 3.5*  NEUTROABS  --   --   --  3.4 2.5  HGB 11.9* 12.1* 12.6* 12.2* 11.5*  HCT 35.5* 36.2* 37.0* 36.9* 34.0*  MCV 98.6 98.6  --  98.9 97.7  PLT 124* 137*  --  112* 94*    Cardiac Enzymes:  Recent Labs Lab 09/15/13 0750 09/15/13 1908  TROPONINI <0.30 <0.30     BNP: BNP (last 3 results)  Recent Labs  11/23/12 0901 09/15/13 0424 09/15/13 0750  PROBNP 1891.0* 2704.0* 3038.0*    Imaging: No results found.   Medications:     Scheduled Medications: . amiodarone  400 mg Oral Daily  . aspirin EC  81 mg Oral Daily  . atorvastatin  80 mg Oral Daily  . feeding supplement (RESOURCE BREEZE)  1 Container Oral BID BM  . heparin  5,000 Units Subcutaneous Q8H  . levothyroxine  112 mcg Oral QAC breakfast  . piperacillin-tazobactam (ZOSYN)  IV  3.375 g Intravenous Q8H  . quiniDINE gluconate  324 mg Oral Q12H  . sodium chloride  3 mL Intravenous Q12H  . vancomycin  750 mg Intravenous Q12H    Infusions:    PRN Medications: acetaminophen, acetaminophen, nitroGLYCERIN, ondansetron (ZOFRAN) IV, sodium chloride   Assessment:   1) Recurrent VTach, s/p ICD shock  --ablation 09/2011 and 06/2013  2) Chronic systolic HF, EF 20-25% (09/2013)  3) Mixed NICM/ICM  4) CAD  --s/p previous inferolateral MI.  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 T Max 102.4. Urine cultures - No growth.  Nasal Swab negative for influenza. Blood cultures pending. Continue vanc/zosyn. ECHO EF 20-25% RV mildly dilated  Peak PA pressure 61.  May need TEE to look at ICD leads especially if grows staph or enterococcus.   Volume status stable. Weight down another 4 pounds off diuretics. Continue to hold diuretics.   Plan will be for VAD placement after Thanksgiving.   No VT on tele.  Continue amiodarone 400 mg daily and quinide gluconate 324 mg every 12 hours.   CLEGG,AMY NP-C 6:44 AM  Patient seen and examined with Tonye Becket, NP. We discussed all aspects of the encounter. I agree with the assessment and plan as stated above.   He has recurrent fevers concerning for bloodstream infection. All cultures negative. I am  becoming increasingly concerned for blood stream infection. Blood cultures remain negative. No focal source on exam or by history. Will repeat cultures. Plan for TEE this am. Will expand abx to vanc/imipenem. Check HIV and hepatitis serologies. May need fungal coverage. I have d/w Dr. Daiva Eves in ID who will see him later today.  Fevers  coincided with initiation of quinidine and I wonder whether he may have drug-induced lupus like reaction. Check ANA and anti-histone Abs.   Recent CT C/A/P reviewed. No concerning areas of infection.   HF and VT remain stable for now.    Nathalee Smarr,MD 8:24 AM

## 2013-09-22 NOTE — Progress Notes (Signed)
VASCULAR LAB PRELIMINARY  PRELIMINARY  PRELIMINARY  PRELIMINARY  Bilateral lower extremity venous duplex  completed.    Preliminary report:  Bilateral:  No evidence of DVT, superficial thrombosis, or Baker's Cyst.    Lashante Fryberger, RVT 09/22/2013, 3:27 PM

## 2013-09-22 NOTE — Progress Notes (Signed)
CARDIAC REHAB PHASE I   PRE:  Rate/Rhythm: 74 Pacing  BP:  Supine: 101/54  Sitting: 94/52  Standing: 58/39   SaO2: 96 RA  MODE:  Ambulation:  Stood pt at bedside  POST:  Rate/Rhythm: 84 Pacing  BP:  Supine:   Sitting: 78/57 102/61    SaO2:  1445-1515  On arrival pt in bed without c/o see BP above. Stood pt BP 58/39. Pt was abl e to stand without c/o. When BP checked, sat pt down and ask if he felt dizzy. He state that everything started going around. Rechecked BP sitting 78/57, pt states that he is feeling better sitting. Had him drink some fluid. BP 102/61. Vascular in to do testing, so placed pt back in bed. We will follow pt tomorrow.  Melina Copa RN 09/22/2013 3:11 PM

## 2013-09-22 NOTE — Telephone Encounter (Signed)
HF Clinic CSW contacted significant other Trina Ao to discuss need for VAD assessment and scheduled for 9:30am on Thursday September 24, 2013. Lasandra Beech, LCSW [phone]

## 2013-09-22 NOTE — Consult Note (Signed)
Patient [name]      DOB: 05/16/1941      YNW:[id number]     Consult Note from the Palliative Medicine Team at [facility]    Consult Requested by: Dr Gala Romney     PCP: [name], MD Reason for Consultation:  Preparedness Plan for anticipated LVAD     Phone Number:[phone]  Assessment of patients Current state: [name] is a 72 y.o. male with an ischemic CM (EF 15-20%)s/p ICD implant, chronic systolic HF, paroxysmal VT, CAD and HTN.  Admitted through the ED after a syncopal episode and brief loss of consciencess.  An  interrogation of his Medronic ICD showed a 9 second run of NSVT, rate of 180.  He has been in his usual state of health over past several weeks, no change in DOE, SOB, no CP, Fever, N/V, Appetite.  Admitted for medical stabilization and consideration for LVAD implantation     This NP [name] reviewed medical records, received report from team, assessed the patient and then meet at the bedside with his significant other of 12 years [name] # [name] [phone] to discuss advanced directives and a preparedness plan in light of consideration of LVAD implantation in the near future.  This is considered destination therapy.    A detailed discussion was had today regarding the concept of a preparedness plan as it relates to LVAD therpay with intention of destination therapy.    Both [name] and [name]  were comfortable talking about the "what ifs and the importance of today's conversation so everyone can have all the information to be full participants and to understand the patient's basic beliefs and wishes as it relates  to healthcare.  Concepts specific to future possibilities of -long term ventilation -artificial feeding and hydration -long term antibiotic use -dialysis -psychological adjustments -need to terminate the pump- Other chronic or terminal disease unrelated to cardiac LVAD  Patient was able to verbalize to [name] the  importance of quality of life.  He is hopeful  That LVAD procedure will increase his qulaity and quantity of life.  He understands the risks and benefits of the procedure as it relates to his future.  At this time patient is open to all available medical interventions to prolong life and the success of the LVAD therapy. He shared and verbalized an understanding that he and his family would know when the burdens of treatment would outweigh the benefits and trust in his family's support at that time.   Patient and family were encouraged to continue conversation into the future as it is vital for the patient centered care.  Chaplain services offered and refused at this time.     Brief HPI:  [name] is a 72 y.o. male with an ischemic CM (EF 15-20%)s/p ICD implant, chronic systolic HF, paroxysmal VT, CAD and HTN.  Admitted through the ED after a syncopal episode and brief loss of consciencess.  An  interrogation of his Medronic ICD showed a 9 second run of NSVT, rate of 180.  He has been in his usual state of health over past several weeks, no change in DOE, SOB, no CP, Fever, N/V, Appetite.  Admitted for medical stabilization and consideration for LVAD implantation    NUU:[id number], fatigue on minimal exertion    PMH:  Past Medical History  Diagnosis Date  . CAD (coronary artery disease)     s/p MI 1982;  s/p DES to CFX 2011;  s/p NSTEMI  4/12 in setting of VTach;  cath 7/12:  LM ok, LAD mid 30-40%, Dx's with 40%, mCFX stent patent, prox to mid RCA occluded with R to R and L to R collats.  Medical Tx was continued  . Bradycardia   . Ventricular tachycardia     s/p AICD;   h/o ICD shock;  Amiodarone Rx. S/P ablation in 2012  . 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 HTN (hypertension)   . HLD (hyperlipidemia)   . Cytopenia   . Hypothyroidism   .  Renal insufficiency   . Claudication   . Chronic systolic heart failure   . Ischemic cardiomyopathy     echo 7/12:  EF 25-30%, mild AI, mod Frank, mod LAE  . TIA (transient ischemic attack) 2001  . CVD (cerebrovascular disease)     left CEA 2008  . Stroke   . Anemia   . Inappropriate therapy from implantable cardioverter-defibrillator 04/02/2013    ATP for sinus tach  SVT wavelet identified SVT but was no  passive   . Myocardial infarction 1992  . Anginal pain   . Automatic implantable cardioverter-defibrillator in situ     Medtronic Evera device serial number [id number]    . Sleep apnea     denies wearing mask (05/25/2013)  . Arthritis     "a little in my knees" (05/25/2013)  . Melanoma of ear 2013    "near left ear" (05/25/2013)     PSH: Past Surgical History  Procedure Laterality Date  . Femoral-popliteal bypass graft Right 2011  . Cardiac defibrillator placement  2003; 2005; 05/25/2013    2014: Medtronic Evera device serial number [id number]  . Revascularization / in-situ graft leg    . Renal artery bypass Bilateral 2003  . Back surgery    . Lumbar disc surgery  1974; 2000's  . Knee arthroscopy Bilateral 1990's  . Elbow arthroscopy Right 1990's  . Cholecystectomy  12/15/?2010  . Lipoma excision Left 2013    "near ear" (05/25/2013)  . Heel spur surgery Right 1990's  . Cataract extraction w/ intraocular lens  implant, bilateral Bilateral 2000  . Carotid endarterectomy Left ~ 2007  . Doppler echocardiography  2011  . Tonsillectomy  1946  . Femoral-popliteal bypass graft Left 05/2002    Hattie Perch 05/06/2002 (05/25/2013)  . Tumor excision Left 1960's    "fatty tumor" (05/25/2013)  . Cardiac electrophysiology study and ablation  2012  . Coronary angioplasty  1992  . Cardiac catheterization      "several" (05/25/2013)  . Coronary angioplasty with stent placement      "last one was 11/2012  (05/25/2013)   I have reviewed the FH and SH and  If appropriate update it with new  information. Allergies  Allergen Reactions  . Meperidine Hcl Other (See Comments)    "CAN'T MOVE" CATATONIC PER PT.  [name] Opium Other (See Comments)    "CAN'T MOVE" CATATONIC PER PT.   Scheduled Meds: . amiodarone  400 mg Oral Daily  . aspirin EC  81 mg Oral Daily  . atorvastatin  80 mg Oral Daily  . feeding supplement (RESOURCE BREEZE)  1 Container Oral BID BM  . fentaNYL  25 mcg Intravenous Once  . heparin  5,000 Units Subcutaneous Q8H  . imipenem-cilastatin  500 mg Intravenous Q8H  .  levothyroxine  112 mcg Oral QAC breakfast  . midazolam  2 mg Intravenous Once  . sodium chloride  3 mL Intravenous Q12H  . vancomycin  1,000 mg Intravenous Q12H   Continuous Infusions: . sodium chloride 20 mL/hr at 09/22/13 0909   PRN Meds:.acetaminophen, acetaminophen, nitroGLYCERIN, ondansetron (ZOFRAN) IV, sodium chloride    BP 108/66  Pulse 74  Temp(Src) 98.8 F (37.1 C) (Oral)  Resp 15  Ht 6' (1.829 m)  Wt 85.639 kg (188 lb 12.8 oz)  BMI 25.60 kg/m2  SpO2 97%   PPS:30 %   Intake/Output Summary (Last 24 hours) at 09/22/13 1700 Last data filed at 09/22/13 1600  Gross per 24 hour  Intake    940 ml  Output   1050 ml  Net   -110 ml    Physical Exam:  General: chronically ill appearing, NAD, in good spirits HEENT:  Mm, no exudate, No JVD Chest:   CTA CVS: RRR Abdomen:soft NT +BS Ext: without edema Neuro: alert and oriented X3 Psych: Good insight to situation and reports positive mood  Labs: CBC    Component Value Date/Time   WBC 4.0 09/22/2013 0817   RBC 3.64* 09/22/2013 0817   HGB 12.0* 09/22/2013 0817   HCT 35.6* 09/22/2013 0817   PLT 105* 09/22/2013 0817   MCV 97.8 09/22/2013 0817   MCH 33.0 09/22/2013 0817   MCHC 33.7 09/22/2013 0817   RDW 14.8 09/22/2013 0817   LYMPHSABS 0.6* 09/22/2013 0817   MONOABS 0.6 09/22/2013 0817   EOSABS 0.2 09/22/2013 0817   BASOSABS 0.0 09/22/2013 0817    BMET    Component Value Date/Time   NA 136 09/22/2013 0817   K 4.6  09/22/2013 0817   CL 100 09/22/2013 0817   CO2 28 09/22/2013 0817   GLUCOSE 100* 09/22/2013 0817   BUN 18 09/22/2013 0817   CREATININE 1.56* 09/22/2013 0817   CALCIUM 8.9 09/22/2013 0817   GFRNONAA 42* 09/22/2013 0817   GFRAA 49* 09/22/2013 0817    CMP     Component Value Date/Time   NA 136 09/22/2013 0817   K 4.6 09/22/2013 0817   CL 100 09/22/2013 0817   CO2 28 09/22/2013 0817   GLUCOSE 100* 09/22/2013 0817   BUN 18 09/22/2013 0817   CREATININE 1.56* 09/22/2013 0817   CALCIUM 8.9 09/22/2013 0817   PROT 6.3 09/22/2013 0817   ALBUMIN 3.0* 09/22/2013 0817   AST 221* 09/22/2013 0817   ALT 206* 09/22/2013 0817   ALKPHOS 157* 09/22/2013 0817   BILITOT 0.7 09/22/2013 0817   GFRNONAA 42* 09/22/2013 0817   GFRAA 49* 09/22/2013 0817     Time In Time Out Total Time Spent with Patient Total Overall Time  1700 1800 60 min 60 min    Greater than 50%  of this time was spent counseling and coordinating care related to the above assessment and plan. Frank Creed NP  Palliative Medicine Team Team Phone # [phone] Pager [phone]

## 2013-09-22 NOTE — Consult Note (Signed)
[facility]    Date of Admission:  09/15/2013  Date of Consult:  09/22/2013  Reason for Consult: FUO Referring Physician: Dr. Gala Romney   HPI: [name] is an 72 y.o. male ischemic CM s/p ICD implant, chronic systolic HF (EF 20-25%), paroxysmal VT s/p ablation (09/2011) and repeat (06/2013), CAD, PVD (s/p R fem pop BPG 01/2010; s/p L CFA BPG 05/2002), AAA (s/p repair 04/2002), CAS (s/p L CEA 2008), HTN, arthritis, and TIA.   Over the past 2 months has experienced multiple episodes of Vtach with ICD shocks.He was last admitted to the hosptial 09/06/13-09/09/13 for recurrent VT s/p ICD shock and Qunidine was started. He now presented back to the ED 09/15/13 with CP and was found to be in Vtach rate of 113, which he was cardioverted successfully.   As an inpatient he developed a fever of 103 on November 15. Urine cultures blood cultures were obtained. Urine cultures are sterile and blood cultures are negative to date. The day prior to his fevers he had CT of the chest abdomen and pelvis done in preparation for his possible LVAD placement.  CT the chest abdomen and pelvis showed only Granulomatous calcification in upper lobe,  graft repair of abdominal aortic aneurysm  Nonobstructing left renal calculus.   Bilateral renal cysts of varying complexity. incompletely characterized without IV contrast material.   Mild thickening of the appendix which contains an appendicolith.  He was started on vancomycin and ceftriaxone on the 15th and continued on these antibiotics through the 17th which point he became febrile again to nearly 103. In addition to the measured temperatures he had subjective fevers chills sweats and rigors. Otherwise he had no specific symptoms. On the 17th which he was changed over to vancomycin and Zosyn and today to vancomycin and imipenem. Repeat blood cultures were taken on the 17th.  Today he underwent transesophageal echocardiogram and this  failed to show any evidence of vegetations on his heart valves or his pacemaker leads.  We are consult to assist in the workup and management this patient a fever of unknown origin. Dr. Gala Romney has raised ? Could this be related to quinidine induced fever (since quinidine can cause a drug induced SLE with fever and arthralgias. The quinidine has been stopped.    Past Medical History  Diagnosis Date  . CAD (coronary artery disease)     s/p MI 1982;  s/p DES to CFX 2011;  s/p NSTEMI 4/12 in setting of VTach;  cath 7/12:  LM ok, LAD mid 30-40%, Dx's with 40%, mCFX stent patent, prox to mid RCA occluded with R to R and L to R collats.  Medical Tx was continued  . Bradycardia   . Ventricular tachycardia     s/p AICD;   h/o ICD shock;  Amiodarone Rx. S/P ablation in 2012  . 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 HTN (hypertension)   . HLD (hyperlipidemia)   . Cytopenia   . Hypothyroidism   . Renal insufficiency   . Claudication   . Chronic systolic heart failure   . Ischemic cardiomyopathy     echo 7/12:  EF 25-30%, mild AI, mod MR, mod LAE  . TIA (transient ischemic attack) 2001  . CVD (cerebrovascular disease)  left CEA 2008  . Stroke   . Anemia   . Inappropriate therapy from implantable cardioverter-defibrillator 04/02/2013    ATP for sinus tach  SVT wavelet identified SVT but was no  passive   . Myocardial infarction 1992  . Anginal pain   . Automatic implantable cardioverter-defibrillator in situ     Medtronic Evera device serial number [id number]    . Sleep apnea     denies wearing mask (05/25/2013)  . Arthritis     "a little in my knees" (05/25/2013)  . Melanoma of ear 2013    "near left ear" (05/25/2013)    Past Surgical History  Procedure Laterality Date  . Femoral-popliteal bypass graft Right 2011  . Cardiac defibrillator placement   2003; 2005; 05/25/2013    2014: Medtronic Evera device serial number [id number]  . Revascularization / in-situ graft leg    . Renal artery bypass Bilateral 2003  . Back surgery    . Lumbar disc surgery  1974; 2000's  . Knee arthroscopy Bilateral 1990's  . Elbow arthroscopy Right 1990's  . Cholecystectomy  12/15/?2010  . Lipoma excision Left 2013    "near ear" (05/25/2013)  . Heel spur surgery Right 1990's  . Cataract extraction w/ intraocular lens  implant, bilateral Bilateral 2000  . Carotid endarterectomy Left ~ 2007  . Doppler echocardiography  2011  . Tonsillectomy  1946  . Femoral-popliteal bypass graft Left 05/2002    Hattie Perch 05/06/2002 (05/25/2013)  . Tumor excision Left 1960's    "fatty tumor" (05/25/2013)  . Cardiac electrophysiology study and ablation  2012  . Coronary angioplasty  1992  . Cardiac catheterization      "several" (05/25/2013)  . Coronary angioplasty with stent placement      "last one was 11/2012  (05/25/2013)  ergies:   Allergies  Allergen Reactions  . Meperidine Hcl Other (See Comments)    "CAN'T MOVE" CATATONIC PER PT.  [name] Opium Other (See Comments)    "CAN'T MOVE" CATATONIC PER PT.     Medications: I have reviewed patients current medications as documented in Epic Anti-infectives   Start     Dose/Rate Route Frequency Ordered Stop   09/22/13 2000  vancomycin (VANCOCIN) IVPB 1000 mg/200 mL premix     1,000 mg 200 mL/hr over 60 Minutes Intravenous Every 12 hours 09/22/13 1310     09/22/13 1000  imipenem-cilastatin (PRIMAXIN) 500 mg in sodium chloride 0.9 % 100 mL IVPB     500 mg 200 mL/hr over 30 Minutes Intravenous 3 times per day 09/22/13 0917     09/21/13 1700  piperacillin-tazobactam (ZOSYN) IVPB 3.375 g  Status:  Discontinued     3.375 g 12.5 mL/hr over 240 Minutes Intravenous Every 8 hours 09/21/13 0824 09/22/13 0831   09/21/13 0930  piperacillin-tazobactam (ZOSYN) IVPB 3.375 g     3.375 g 100 mL/hr over 30 Minutes Intravenous  Once 09/21/13 0824  09/21/13 0954   09/19/13 2100  cefTRIAXone (ROCEPHIN) 1 g in dextrose 5 % 50 mL IVPB  Status:  Discontinued     1 g 100 mL/hr over 30 Minutes Intravenous Every 24 hours 09/19/13 2029 09/21/13 0806   09/19/13 2100  vancomycin (VANCOCIN) IVPB 750 mg/150 ml premix  Status:  Discontinued     750 mg 150 mL/hr over 60 Minutes Intravenous Every 12 hours 09/19/13 2029 09/22/13 1310      Social History:  reports that he quit smoking about 11 years ago. His smoking use included Cigarettes. He has a  18.5 pack-year smoking history. He has never used smokeless tobacco. He reports that he drinks alcohol. He reports that he does not use illicit drugs. he [place] up in [place] and also I spent time of her sees in [place] and the he was in [place]. He does not recall ever having suffered from tuberculosis he has certainly lived in hist[place]osis country. He does not hunt or fish with his main hobby being golfing.  Family History  Problem Relation Age of Onset  . Coronary artery disease    . Heart attack Mother   . Coronary artery disease Mother   . Coronary artery disease Father   . Stroke Father     As in HPI and primary teams notes otherwise 12 point review of systems is negative  Blood pressure 102/61, pulse 74, temperature 97 F (36.1 C), temperature source Oral, resp. rate 17, height 6' (1.829 m), weight 188 lb 12.8 oz (85.639 kg), SpO2 96.00%. General: Alert and awake, oriented x3, not in any acute distress. HEENT: anicteric sclera, pupils reactive to light and accommodation, EOMI, oropharynx clear and without exudate CVS regular rate, normal r,  no murmur rubs or gallops Chest: clear to auscultation bilaterally, no wheezing, rales or rhonchi Abdomen: soft nontender, nondistended, normal bowel sounds, Extremities: no  clubbing or edema noted bilaterally Skin: no rashes, ICD site is clean Neuro: nonfocal, strength and sensation intact   Results for orders placed during the hospital  encounter of 09/15/13 (from the past 48 hour(s))  CBC WITH DIFFERENTIAL     Status: Abnormal   Collection Time    09/21/13  4:30 AM      Result Value Range   WBC 3.5 (*) 4.0 - 10.5 K/uL   RBC 3.48 (*) 4.22 - 5.81 MIL/uL   Hemoglobin 11.5 (*) 13.0 - 17.0 g/dL   HCT 13.0 (*) 86.5 - 78.4 %   MCV 97.7  78.0 - 100.0 fL   MCH 33.0  26.0 - 34.0 pg   MCHC 33.8  30.0 - 36.0 g/dL   RDW 69.6  29.5 - 28.4 %   Platelets 94 (*) 150 - 400 K/uL   Comment: CONSISTENT WITH PREVIOUS RESULT   Neutrophils Relative % 70  43 - 77 %   Neutro Abs 2.5  1.7 - 7.7 K/uL   Lymphocytes Relative 14  12 - 46 %   Lymphs Abs 0.5 (*) 0.7 - 4.0 K/uL   Monocytes Relative 12  3 - 12 %   Monocytes Absolute 0.4  0.1 - 1.0 K/uL   Eosinophils Relative 3  0 - 5 %   Eosinophils Absolute 0.1  0.0 - 0.7 K/uL   Basophils Relative 1  0 - 1 %   Basophils Absolute 0.0  0.0 - 0.1 K/uL  COMPREHENSIVE METABOLIC PANEL     Status: Abnormal   Collection Time    09/21/13  4:30 AM      Result Value Range   Sodium 133 (*) 135 - 145 mEq/L   Potassium 4.6  3.5 - 5.1 mEq/L   Chloride 97  96 - 112 mEq/L   CO2 25  19 - 32 mEq/L   Glucose, Bld 96  70 - 99 mg/dL   BUN 21  6 - 23 mg/dL   Creatinine, Ser 1.32 (*) 0.50 - 1.35 mg/dL   Calcium 8.7  8.4 - 44.0 mg/dL   Total Protein 6.4  6.0 - 8.3 g/dL   Albumin 3.1 (*) 3.5 - 5.2 g/dL  AST 163 (*) 0 - 37 U/L   ALT 134 (*) 0 - 53 U/L   Alkaline Phosphatase 138 (*) 39 - 117 U/L   Total Bilirubin 0.6  0.3 - 1.2 mg/dL   GFR calc non Af [name] 37 (*) >90 mL/min   GFR calc Af [name] 42 (*) >90 mL/min   Comment: (NOTE)     The eGFR has been calculated using the CKD EPI equation.     This calculation has not been validated in all clinical situations.     eGFR's persistently <90 mL/min signify possible Chronic Kidney     Disease.  CBC WITH DIFFERENTIAL     Status: Abnormal   Collection Time    09/22/13  8:17 AM      Result Value Range   WBC 4.0  4.0 - 10.5 K/uL   RBC 3.64 (*) 4.22 - 5.81 MIL/uL     Hemoglobin 12.0 (*) 13.0 - 17.0 g/dL   HCT 69.6 (*) 29.5 - 28.4 %   MCV 97.8  78.0 - 100.0 fL   MCH 33.0  26.0 - 34.0 pg   MCHC 33.7  30.0 - 36.0 g/dL   RDW 13.2  44.0 - 10.2 %   Platelets 105 (*) 150 - 400 K/uL   Comment: REPEATED TO VERIFY     CONSISTENT WITH PREVIOUS RESULT   Neutrophils Relative % 65  43 - 77 %   Neutro Abs 2.6  1.7 - 7.7 K/uL   Lymphocytes Relative 14  12 - 46 %   Lymphs Abs 0.6 (*) 0.7 - 4.0 K/uL   Monocytes Relative 16 (*) 3 - 12 %   Monocytes Absolute 0.6  0.1 - 1.0 K/uL   Eosinophils Relative 4  0 - 5 %   Eosinophils Absolute 0.2  0.0 - 0.7 K/uL   Basophils Relative 1  0 - 1 %   Basophils Absolute 0.0  0.0 - 0.1 K/uL  COMPREHENSIVE METABOLIC PANEL     Status: Abnormal   Collection Time    09/22/13  8:17 AM      Result Value Range   Sodium 136  135 - 145 mEq/L   Potassium 4.6  3.5 - 5.1 mEq/L   Chloride 100  96 - 112 mEq/L   CO2 28  19 - 32 mEq/L   Glucose, Bld 100 (*) 70 - 99 mg/dL   BUN 18  6 - 23 mg/dL   Creatinine, Ser 7.25 (*) 0.50 - 1.35 mg/dL   Calcium 8.9  8.4 - 36.6 mg/dL   Total Protein 6.3  6.0 - 8.3 g/dL   Albumin 3.0 (*) 3.5 - 5.2 g/dL   AST 440 (*) 0 - 37 U/L   ALT 206 (*) 0 - 53 U/L   Alkaline Phosphatase 157 (*) 39 - 117 U/L   Total Bilirubin 0.7  0.3 - 1.2 mg/dL   GFR calc non Af [name] 42 (*) >90 mL/min   GFR calc Af [name] 49 (*) >90 mL/min   Comment: (NOTE)     The eGFR has been calculated using the CKD EPI equation.     This calculation has not been validated in all clinical situations.     eGFR's persistently <90 mL/min signify possible Chronic Kidney     Disease.  SEDIMENTATION RATE     Status: None   Collection Time    09/22/13  8:17 AM      Result Value Range   Sed Rate 15  0 - 16  mm/hr  VANCOMYCIN, TROUGH     Status: None   Collection Time    09/22/13  8:17 AM      Result Value Range   Vancomycin Tr 12.3  10.0 - 20.0 ug/mL      Component Value Date/Time   SDES URINE, CLEAN CATCH 09/19/2013 2213   SPECREQUEST  Normal 09/19/2013 2213   CULT  Value: NO GROWTH Performed at [facility] 09/19/2013 2213   REPTSTATUS 09/20/2013 FINAL 09/19/2013 2213   No results found.   Recent Results (from the past 720 hour(s))  MRSA PCR SCREENING     Status: None   Collection Time    09/15/13  7:12 AM      Result Value Range Status   MRSA by PCR NEGATIVE  NEGATIVE Final   Comment:            The GeneXpert MRSA Assay (FDA     approved for NASAL specimens     only), is one component of a     comprehensive MRSA colonization     surveillance program. It is not     intended to diagnose MRSA     infection nor to guide or     monitor treatment for     MRSA infections.  CULTURE, BLOOD (ROUTINE X 2)     Status: None   Collection Time    09/19/13  9:25 PM      Result Value Range Status   Specimen Description BLOOD RIGHT HAND   Final   Special Requests BOTTLES DRAWN AEROBIC ONLY 1CC   Final   Culture  Setup Time     Final   Value: 09/20/2013 00:19     Performed at [facility]   Culture     Final   Value:        BLOOD CULTURE RECEIVED NO GROWTH TO DATE CULTURE WILL BE HELD FOR 5 DAYS BEFORE ISSUING A FINAL NEGATIVE REPORT     Performed at [facility]   Report Status PENDING   Incomplete  CULTURE, BLOOD (ROUTINE X 2)     Status: None   Collection Time    09/19/13  9:30 PM      Result Value Range Status   Specimen Description BLOOD LEFT HAND   Final   Special Requests BOTTLES DRAWN AEROBIC ONLY 1CC   Final   Culture  Setup Time     Final   Value: 09/20/2013 00:19     Performed at [facility]   Culture     Final   Value:        BLOOD CULTURE RECEIVED NO GROWTH TO DATE CULTURE WILL BE HELD FOR 5 DAYS BEFORE ISSUING A FINAL NEGATIVE REPORT     Performed at [facility]   Report Status PENDING   Incomplete  URINE CULTURE     Status: None   Collection Time    09/19/13 10:13 PM      Result Value Range Status   Specimen Description URINE, CLEAN CATCH   Final    Special Requests Normal   Final   Culture  Setup Time     Final   Value: 09/20/2013 00:22     Performed at [facility]   Culture     Final   Value: NO GROWTH     Performed at [facility]   Report Status 09/20/2013 FINAL   Final  RESPIRATORY VIRUS PANEL     Status: None  Collection Time    09/20/13  5:49 AM      Result Value Range Status   Source - RVPAN NASAL SWAB   Corrected   Comment: CORRECTED ON 11/16 AT 2254: PREVIOUSLY REPORTED AS NASAL SWAB   Respiratory Syncytial Virus A NOT DETECTED   Final   Respiratory Syncytial Virus B NOT DETECTED   Final   Influenza A NOT DETECTED   Final   Influenza B NOT DETECTED   Final   Parainfluenza 1 NOT DETECTED   Final   Parainfluenza 2 NOT DETECTED   Final   Parainfluenza 3 NOT DETECTED   Final   Metapneumovirus NOT DETECTED   Final   Rhinovirus NOT DETECTED   Final   Adenovirus NOT DETECTED   Final   Influenza A H1 NOT DETECTED   Final   Influenza A H3 NOT DETECTED   Final   Comment: (NOTE)           Normal Reference Range for each Analyte: NOT DETECTED     Testing performed using the Luminex xTAG Respiratory Viral Panel test     kit.     This test was developed and its performance characteristics determined     by [facility]. It has not been cleared or approved by the [place]     Food and Drug Administration. This test is used for clinical purposes.     It should not be regarded as investigational or for research. This     laboratory is certified under the Clinical Laboratory Improvement     Amendments of 1988 (CLIA) as qualified to perform high complexity     clinical laboratory testing.     Performed at [facility]     Impression/Recommendation  72 yo with severe ischemic cardiomyopathy, CHF being evaluated for LVAD with admission for Vtach adn ICD firing again now with fevers of unkown origin   #1 FUO: He has been fairly extensively worked up for nosocomial causes of fevers and is on very  broad spectrum antibiotics in the form of vancomycin and imipenem. I agree with stopping the clonidine in case it is causing a drug induced either an arthralgia.  --I. would recommend checking an ANA, RF and a sedimentation rate  --I will set send serologies for HIV and hepatitis panels as well as CMV and Epstein-Barr virus, a lactate dehydrogenase CK, QF gold and histoplasma ag in urine  --if his blood cultues remain negative I would STOP his IV antibiotics as there is no pyogenic source of fever that we are treating here  --Obviously if his blood cultures come back with bacteria in 2/2 then he will need ICD extracted  --I will also check fungal blood culture  Thank you so much for this interesting consult  [facility] [phone] (pager) [phone] (office) 09/22/2013, 4:21 PM  Paulette Blanch Dam 09/22/2013, 4:21 PM

## 2013-09-22 NOTE — CV Procedure (Signed)
    TRANSESOPHAGEAL ECHOCARDIOGRAM   NAME:  CHIBUIKEM THANG   MRN: [id number] DOB:  10/09/1941   ADMIT DATE: 09/15/2013  INDICATIONS: Fever. Evaluate for endocarditis   PROCEDURE:   Informed consent was obtained prior to the procedure. The risks, benefits and alternatives for the procedure were discussed and the patient comprehended these risks.  Risks include, but are not limited to, cough, sore throat, vomiting, nausea, somnolence, esophageal and stomach trauma or perforation, bleeding, low blood pressure, aspiration, pneumonia, infection, trauma to the teeth and death.    After a procedural time-out, the patient was given 3 mg versed and 25 mcg fentanyl for moderate sedation.  The oropharynx was anesthetized with cetacaine spray. The transesophageal probe was inserted in the esophagus and stomach without difficulty and multiple views were obtained.    COMPLICATIONS:    There were no immediate complications.  FINDINGS:  LEFT VENTRICLE: Dilated. Severe global HK. EF = 20%.   RIGHT VENTRICLE: Normal size. Mildly HK.   LEFT ATRIUM: Dilated  LEFT ATRIAL APPENDAGE: No thrombus.   RIGHT ATRIUM: Normal. Pacer wires calcified. No vegetation  AORTIC VALVE:  Trileaflet. Mildly thick. Mild AI. No vegetation.   MITRAL VALVE:    Normal. Mild MR. No vegetation.   TRICUSPID VALVE: Normal. Trivial TR. No vegetation.   PULMONIC VALVE: Normal. No PI. No vegetation  INTERATRIAL SEPTUM: No PFO or ASD.  PERICARDIUM: No effusion  DESCENDING AORTA: Mild plaque   CONCLUSION:  No TEE evidence of endocarditis.   Daniel Bensimhon,MD 9:30 AM

## 2013-09-22 NOTE — Progress Notes (Signed)
  Echocardiogram Echocardiogram Transesophageal has been performed.  Georgian Co 09/22/2013, 9:54 AM

## 2013-09-22 NOTE — Progress Notes (Signed)
  TEE this am with no vegetation. Qunidine stopped.  Seen by ID. (thanks)  Feeling a bit better. No fevers today. Became very orthostatic upon standing with cardiac rehab.   BCX remain negative. On vanc/imi now.    Will give 500 cc NS.  Continue to follow closely. Looks better.   Truman Hayward 6:31 PM

## 2013-09-23 ENCOUNTER — Encounter (HOSPITAL_COMMUNITY): Payer: Medicare Other

## 2013-09-23 DIAGNOSIS — R5381 Other malaise: Secondary | ICD-10-CM

## 2013-09-23 DIAGNOSIS — Z515 Encounter for palliative care: Secondary | ICD-10-CM

## 2013-09-23 DIAGNOSIS — R531 Weakness: Secondary | ICD-10-CM

## 2013-09-23 LAB — LACTATE DEHYDROGENASE: LDH: 309 U/L — ABNORMAL HIGH (ref 94–250)

## 2013-09-23 LAB — COMPREHENSIVE METABOLIC PANEL
ALT: 193 U/L — ABNORMAL HIGH (ref 0–53)
AST: 171 U/L — ABNORMAL HIGH (ref 0–37)
Albumin: 2.7 g/dL — ABNORMAL LOW (ref 3.5–5.2)
BUN: 17 mg/dL (ref 6–23)
Calcium: 8.6 mg/dL (ref 8.4–10.5)
Chloride: 101 mEq/L (ref 96–112)
Creatinine, Ser: 1.42 mg/dL — ABNORMAL HIGH (ref 0.50–1.35)
Sodium: 134 mEq/L — ABNORMAL LOW (ref 135–145)
Total Bilirubin: 0.6 mg/dL (ref 0.3–1.2)

## 2013-09-23 LAB — CBC WITH DIFFERENTIAL/PLATELET
Basophils Absolute: 0 10*3/uL (ref 0.0–0.1)
Basophils Relative: 1 % (ref 0–1)
Eosinophils Absolute: 0.2 10*3/uL (ref 0.0–0.7)
Eosinophils Relative: 4 % (ref 0–5)
HCT: 32.7 % — ABNORMAL LOW (ref 39.0–52.0)
Lymphocytes Relative: 15 % (ref 12–46)
MCH: 33.2 pg (ref 26.0–34.0)
MCHC: 33.9 g/dL (ref 30.0–36.0)
Monocytes Absolute: 0.7 10*3/uL (ref 0.1–1.0)
Neutro Abs: 2.1 10*3/uL (ref 1.7–7.7)
Platelets: 101 10*3/uL — ABNORMAL LOW (ref 150–400)
RDW: 14.8 % (ref 11.5–15.5)
WBC: 3.6 10*3/uL — ABNORMAL LOW (ref 4.0–10.5)

## 2013-09-23 LAB — ANA: Anti Nuclear Antibody(ANA): NEGATIVE

## 2013-09-23 LAB — HIV ANTIBODY (ROUTINE TESTING W REFLEX): HIV: NONREACTIVE

## 2013-09-23 LAB — SEDIMENTATION RATE: Sed Rate: 21 mm/hr — ABNORMAL HIGH (ref 0–16)

## 2013-09-23 LAB — HEPATITIS PANEL, ACUTE
HCV Ab: NEGATIVE
Hep A IgM: NONREACTIVE
Hep B C IgM: NONREACTIVE
Hepatitis B Surface Ag: NEGATIVE

## 2013-09-23 LAB — FERRITIN: Ferritin: 483 ng/mL — ABNORMAL HIGH (ref 22–322)

## 2013-09-23 LAB — CK: Total CK: 43 U/L (ref 7–232)

## 2013-09-23 LAB — C-REACTIVE PROTEIN: CRP: 6.1 mg/dL — ABNORMAL HIGH (ref <0.60)

## 2013-09-23 LAB — RHEUMATOID FACTOR: Rhuematoid fact SerPl-aCnc: <10 IU/mL (ref <=14)

## 2013-09-23 MED ORDER — CARVEDILOL 3.125 MG PO TABS
3.1250 mg | ORAL_TABLET | Freq: Two times a day (BID) | ORAL | Status: DC
  Administered 2013-09-23 (×2): 3.125 mg via ORAL
  Filled 2013-09-23 (×5): qty 1

## 2013-09-23 NOTE — Progress Notes (Signed)
CARDIAC REHAB PHASE I   PRE:  Rate/Rhythm: pacing 75  BP:  Supine:   Sitting: 98/52 feet up     84/46 feet down few minutes  Standing: 70/42 after standing 30 seconds   SaO2: 98%2L  MODE:  Ambulation: 0 ft  [id number] Pt had been sitting in chair since 0800 or 0830 prior to [place] trying to walk. Orthostatics as documented above. Pt coud only stand 30 seconds before feeling dizzy and SOB. BP 70/42. Sat pt back down and re enforced not getting up by himself. Pt disappointed because he wanted to walk but he knows not safe to do so with low BP and the extreme dizziness.   Luetta Nutting, RN BSN  09/23/2013 10:47 AM

## 2013-09-23 NOTE — Progress Notes (Signed)
[facility]  Day # 5 vancomycin Day # 2 imipenem  Subjective: C/o frontal HA   Antibiotics:  Anti-infectives   Start     Dose/Rate Route Frequency Ordered Stop   09/22/13 2000  vancomycin (VANCOCIN) IVPB 1000 mg/200 mL premix     1,000 mg 200 mL/hr over 60 Minutes Intravenous Every 12 hours 09/22/13 1310     09/22/13 1000  imipenem-cilastatin (PRIMAXIN) 500 mg in sodium chloride 0.9 % 100 mL IVPB     500 mg 200 mL/hr over 30 Minutes Intravenous 3 times per day 09/22/13 0917     09/21/13 1700  piperacillin-tazobactam (ZOSYN) IVPB 3.375 g  Status:  Discontinued     3.375 g 12.5 mL/hr over 240 Minutes Intravenous Every 8 hours 09/21/13 0824 09/22/13 0831   09/21/13 0930  piperacillin-tazobactam (ZOSYN) IVPB 3.375 g     3.375 g 100 mL/hr over 30 Minutes Intravenous  Once 09/21/13 0824 09/21/13 0954   09/19/13 2100  cefTRIAXone (ROCEPHIN) 1 g in dextrose 5 % 50 mL IVPB  Status:  Discontinued     1 g 100 mL/hr over 30 Minutes Intravenous Every 24 hours 09/19/13 2029 09/21/13 0806   09/19/13 2100  vancomycin (VANCOCIN) IVPB 750 mg/150 ml premix  Status:  Discontinued     750 mg 150 mL/hr over 60 Minutes Intravenous Every 12 hours 09/19/13 2029 09/22/13 1310      Medications: Scheduled Meds: . amiodarone  400 mg Oral Daily  . aspirin EC  81 mg Oral Daily  . atorvastatin  80 mg Oral Daily  . carvedilol  3.125 mg Oral BID WC  . feeding supplement (RESOURCE BREEZE)  1 Container Oral BID BM  . fentaNYL  25 mcg Intravenous Once  . heparin  5,000 Units Subcutaneous Q8H  . imipenem-cilastatin  500 mg Intravenous Q8H  . levothyroxine  112 mcg Oral QAC breakfast  . midazolam  2 mg Intravenous Once  . sodium chloride  3 mL Intravenous Q12H  . vancomycin  1,000 mg Intravenous Q12H   Continuous Infusions: . sodium chloride 20 mL/hr at 09/22/13 0909   PRN Meds:.acetaminophen, acetaminophen, nitroGLYCERIN, ondansetron (ZOFRAN) IV, sodium  chloride   Objective: Weight change: -1 lb 3 oz (-0.539 kg)  Intake/Output Summary (Last 24 hours) at 09/23/13 0916 Last data filed at 09/23/13 0900  Gross per 24 hour  Intake   1820 ml  Output    450 ml  Net   1370 ml   Blood pressure 104/59, pulse 73, temperature 98.5 F (36.9 C), temperature source Oral, resp. rate 17, height 6' (1.829 m), weight 187 lb 9.8 oz (85.1 kg), SpO2 99.00%. Temp:  [98.5 F (36.9 C)-99.9 F (37.7 C)] 98.5 F (36.9 C) (11/19 0823) Pulse Rate:  [68-76] 73 (11/19 0500) Resp:  [11-20] 17 (11/19 0823) BP: (58-137)/(33-83) 104/59 mmHg (11/19 0823) SpO2:  [96 %-100 %] 99 % (11/19 0823) Weight:  [187 lb 9.8 oz (85.1 kg)] 187 lb 9.8 oz (85.1 kg) (11/19 8119)  Physical Exam: General: Alert and awake, oriented x3, not in any acute distress.  HEENT: anicteric sclera,  meningismum , EOMI, oropharynx clear and without exudate  CVS regular rate, normal r, no murmur rubs or gallops  Chest: clear to auscultation bilaterally, no wheezing, rales or rhonchi  Abdomen: soft nontender, nondistended, normal bowel sounds,  Extremities: no clubbing or edema noted bilaterally  Skin: no rashes, ICD site is clean  Neuro: nonfocal, strength and sensation intact   Lab Results:  Recent Labs  09/22/13 0817 09/23/13 0548  WBC 4.0 3.6*  HGB 12.0* 11.1*  HCT 35.6* 32.7*  PLT 105* 101*    BMET  Recent Labs  09/22/13 0817 09/23/13 0548  NA 136 134*  K 4.6 4.1  CL 100 101  CO2 28 27  GLUCOSE 100* 101*  BUN 18 17  CREATININE 1.56* 1.42*  CALCIUM 8.9 8.6    Micro Results: Recent Results (from the past 240 hour(s))  MRSA PCR SCREENING     Status: None   Collection Time    09/15/13  7:12 AM      Result Value Range Status   MRSA by PCR NEGATIVE  NEGATIVE Final   Comment:            The GeneXpert MRSA Assay (FDA     approved for NASAL specimens     only), is one component of a     comprehensive MRSA colonization     surveillance program. It is not      intended to diagnose MRSA     infection nor to guide or     monitor treatment for     MRSA infections.  CULTURE, BLOOD (ROUTINE X 2)     Status: None   Collection Time    09/19/13  9:25 PM      Result Value Range Status   Specimen Description BLOOD RIGHT HAND   Final   Special Requests BOTTLES DRAWN AEROBIC ONLY 1CC   Final   Culture  Setup Time     Final   Value: 09/20/2013 00:19     Performed at [facility]   Culture     Final   Value:        BLOOD CULTURE RECEIVED NO GROWTH TO DATE CULTURE WILL BE HELD FOR 5 DAYS BEFORE ISSUING A FINAL NEGATIVE REPORT     Performed at [facility]   Report Status PENDING   Incomplete  CULTURE, BLOOD (ROUTINE X 2)     Status: None   Collection Time    09/19/13  9:30 PM      Result Value Range Status   Specimen Description BLOOD LEFT HAND   Final   Special Requests BOTTLES DRAWN AEROBIC ONLY 1CC   Final   Culture  Setup Time     Final   Value: 09/20/2013 00:19     Performed at [facility]   Culture     Final   Value:        BLOOD CULTURE RECEIVED NO GROWTH TO DATE CULTURE WILL BE HELD FOR 5 DAYS BEFORE ISSUING A FINAL NEGATIVE REPORT     Performed at [facility]   Report Status PENDING   Incomplete  URINE CULTURE     Status: None   Collection Time    09/19/13 10:13 PM      Result Value Range Status   Specimen Description URINE, CLEAN CATCH   Final   Special Requests Normal   Final   Culture  Setup Time     Final   Value: 09/20/2013 00:22     Performed at [facility]   Culture     Final   Value: NO GROWTH     Performed at [facility]   Report Status 09/20/2013 FINAL   Final  RESPIRATORY VIRUS PANEL     Status: None   Collection Time    09/20/13  5:49 AM      Result Value  Range Status   Source - RVPAN NASAL SWAB   Corrected   Comment: CORRECTED ON 11/16 AT 2254: PREVIOUSLY REPORTED AS NASAL SWAB   Respiratory Syncytial Virus A NOT DETECTED   Final   Respiratory Syncytial Virus B NOT  DETECTED   Final   Influenza A NOT DETECTED   Final   Influenza B NOT DETECTED   Final   Parainfluenza 1 NOT DETECTED   Final   Parainfluenza 2 NOT DETECTED   Final   Parainfluenza 3 NOT DETECTED   Final   Metapneumovirus NOT DETECTED   Final   Rhinovirus NOT DETECTED   Final   Adenovirus NOT DETECTED   Final   Influenza A H1 NOT DETECTED   Final   Influenza A H3 NOT DETECTED   Final   Comment: (NOTE)           Normal Reference Range for each Analyte: NOT DETECTED     Testing performed using the Luminex xTAG Respiratory Viral Panel test     kit.     This test was developed and its performance characteristics determined     by [facility]. It has not been cleared or approved by the [place]     Food and Drug Administration. This test is used for clinical purposes.     It should not be regarded as investigational or for research. This     laboratory is certified under the Clinical Laboratory Improvement     Amendments of 1988 (CLIA) as qualified to perform high complexity     clinical laboratory testing.     Performed at [facility]    Studies/Results: No results found.    Assessment/Plan: ABIR CRAINE is a 72 y.o. male with  72 yo with severe ischemic cardiomyopathy, CHF being evaluated for LVAD with admission for Vtach adn ICD firing again now with fevers of unkown origin   #1 FUO: He has been fairly extensively worked up for nosocomial causes of fevers and is on very broad spectrum antibiotics in the form of vancomycin and imipenem. I agree with stopping the clonidine in case it is causing a drug induced either an arthralgia. Workup so far is negative. LDH is up slightly. ESR normal   --Given that we are NOT treating any clear cut pyogenic source of infection I am going to DC his antibiotics today   --If he fevers more would recollect blood cultures and would consider further ID workup such as LP but I am NOT at all convinced for bacterial meningitis here  --I  will also check fungal blood culture  --followup ANA, RF and a sedimentation rate  serologies for HIV and hepatitis panels as well as CMV and Epstein-Barr virus, a QF gold and histoplasma ag in urine   --Obviously if his blood cultures come back with bacteria in 2/2 then he will need ICD extracted   Dr. Ninetta Lights is covering me tomorrow.    LOS: 8 days   Acey Lav 09/23/2013, 9:16 AM

## 2013-09-23 NOTE — Progress Notes (Signed)
Advanced Heart Failure Rounding Note   Subjective:    [name] is a 72 yo man with an ischemic CM s/p ICD implant, chronic systolic HF (EF 20-25%), paroxysmal VT s/p ablation (09/2011) and repeat (06/2013), CAD, PVD (s/p R fem pop BPG 01/2010; s/p L CFA BPG 05/2002), AAA (s/p repair 04/2002), CAS (s/p L CEA 2008), HTN, arthritis, and TIA.   ECHO 09/2013: EF 20-25%, mild AI, mild MR, LA mildly dilated and RV systolic fx mildly reduced.   Over the past 2 months has experienced multiple episodes of Vtach with ICD shocks.He was last admitted to the hosptial 09/06/13-09/09/13 for recurrent VT s/p ICD shock and Qunidine was started. He was supposed to have a CPX test on the OP side and be seen in the HF clinic how presented back to the ED 09/15/13 with CP and was found to be in Vtach rate of 113, which he was cardioverted successfully.   CPX 09/16/13 Peak VO2 10.9 VE/VCO2 48 RER 1.11 Ve/MVV 43%  RHC 11/13 RA = 4  RV = 42/4/5  PA = 44/22 (31)  PCW = 19 v = 24  Fick cardiac output/index = 5.45/2.6  Thermo CO/CI = 3.8/1.8  PVR = 2.2 WU (Fick)  FA sat = 92%  PA sat = 60%, 60%  SVC = 61%  ECHO EF 20-25% RV mildly dilated  Peak PA pressure 61  09/22/13 TEE no vegetation  Urine Culture- No growth Nasal Swab Negative for influenza.  Yesterday quinidine stopped. ID Developed consulted. Antibiotics stopped.  Tmax 99.9. Overall says he feels better. Denies SOB   ECHO EF 20-25% RV mildly dilated  Peak PA pressure 61 HIV, ANA, Sed Rate, Epstein Barr , C reactive ptn - pending Fungal Urine Culture pending  Urine Culture- No growth Blood Cultuers- NGTD Nasal Swab Negative for influenza.  Objective:   Weight Range:  Vital Signs:   Temp:  [97 F (36.1 C)-99.9 F (37.7 C)] 99.8 F (37.7 C) (11/18 2356) Pulse Rate:  [68-76] 73 (11/19 0500) Resp:  [0-20] 13 (11/19 0500) BP: (58-137)/(33-83) 111/60 mmHg (11/19 0500) SpO2:  [96 %-100 %] 99 % (11/19 0500) Last BM Date: 09/20/13  Weight  change: Filed Weights   09/16/13 0800 09/17/13 0600 09/22/13 0545  Weight: 192 lb 0.3 oz (87.1 kg) 192 lb 7.4 oz (87.3 kg) 188 lb 12.8 oz (85.639 kg)    Intake/Output:   Intake/Output Summary (Last 24 hours) at 09/23/13 0635 Last data filed at 09/23/13 0500  Gross per 24 hour  Intake   1340 ml  Output    800 ml  Net    540 ml     Physical Exam: General: Fatigued appearing.  No resp difficulty; lying in bed  HEENT: normal; L CEA scar Neck: supple. JVP 8. Carotids 2+ bilat; no bruits. No lymphadenopathy or thryomegaly appreciated.  Cor: PMI nondisplaced. Regular rate & rhythm. No rubs, gallops or murmurs.  Lungs: clear  Abdomen: soft, nontender, nondistended. No hepatosplenomegaly. No bruits or masses. Good bowel sounds.  Extremities: no cyanosis, clubbing, rash, edema  Neuro: alert & orientedx3, cranial nerves grossly intact. moves all 4 extremities w/o difficulty. Affect pleasant  Telemetry: A paced 80s  Labs: Basic Metabolic Panel:  Recent Labs Lab 09/17/13 1335 09/18/13 1525 09/20/13 1200 09/21/13 0430 09/22/13 0817  NA  --  134* 134* 133* 136  K  --  4.1 4.4 4.6 4.6  CL  --  106 99 97 100  CO2  --   --  24 25 28   GLUCOSE  --  111* 84 96 100*  BUN  --  19 20 21 18   CREATININE 1.52* 1.70* 1.74* 1.76* 1.56*  CALCIUM  --   --  8.8 8.7 8.9    Liver Function Tests:  Recent Labs Lab 09/20/13 1200 09/21/13 0430 09/22/13 0817  AST 100* 163* 221*  ALT 82* 134* 206*  ALKPHOS 131* 138* 157*  BILITOT 0.6 0.6 0.7  PROT 6.6 6.4 6.3  ALBUMIN 3.2* 3.1* 3.0*   No results found for this basename: LIPASE, AMYLASE,  in the last 168 hours No results found for this basename: AMMONIA,  in the last 168 hours  CBC:  Recent Labs Lab 09/17/13 1335 09/18/13 1525 09/20/13 1200 09/21/13 0430 09/22/13 0817 09/23/13 0548  WBC 6.1  --  4.4 3.5* 4.0 3.6*  NEUTROABS  --   --  3.4 2.5 2.6 2.1  HGB 12.1* 12.6* 12.2* 11.5* 12.0* 11.1*  HCT 36.2* 37.0* 36.9* 34.0* 35.6* 32.7*   MCV 98.6  --  98.9 97.7 97.8 97.9  PLT 137*  --  112* 94* 105* 101*    Cardiac Enzymes: No results found for this basename: CKTOTAL, CKMB, CKMBINDEX, TROPONINI,  in the last 168 hours  BNP: BNP (last 3 results)  Recent Labs  11/23/12 0901 09/15/13 0424 09/15/13 0750  PROBNP 1891.0* 2704.0* 3038.0*    Imaging: No results found.   Medications:     Scheduled Medications: . amiodarone  400 mg Oral Daily  . aspirin EC  81 mg Oral Daily  . atorvastatin  80 mg Oral Daily  . feeding supplement (RESOURCE BREEZE)  1 Container Oral BID BM  . fentaNYL  25 mcg Intravenous Once  . heparin  5,000 Units Subcutaneous Q8H  . imipenem-cilastatin  500 mg Intravenous Q8H  . levothyroxine  112 mcg Oral QAC breakfast  . midazolam  2 mg Intravenous Once  . sodium chloride  3 mL Intravenous Q12H  . vancomycin  1,000 mg Intravenous Q12H    Infusions: . sodium chloride 20 mL/hr at 09/22/13 0909    PRN Medications: acetaminophen, acetaminophen, nitroGLYCERIN, ondansetron (ZOFRAN) IV, sodium chloride   Assessment:   1) Recurrent VTach, s/p ICD shock  --ablation 09/2011 and 06/2013  2) Chronic systolic HF, EF 20-25% (09/2013)  3) Mixed NICM/ICM  4) CAD  --s/p previous inferolateral MI.  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 Urine cultures - No growth.  Nasal Swab negative for influenza. Blood cultures pending. Off antibiotics. TEE no vegetation.   ID following. HIV, ANA, Sed Rate, Epstein Barr , C reactive ptn - pending. Fungal Urine Culture pending   Volume status stable. Weight down another 1 pounds.  Continue to hold diuretics. Add 3.125 mg carvedilol twice a day. Hold off on Ace. Renal function trending down.   Possible VAD placement after Thanksgiving.   No VT on tele.  Continue amiodarone 400 mg daily. Quinidine has been stopped.     CLEGG,AMY NP-C 6:35 AM  Patient seen with NP, agree with the above note.  Tm 99.9, off abx.  Feels ok this morning.  He got very orthostatic yesterday when he got up to walk.  Can start back low dose Coreg but continue to hold ACEI and Lasix.  Needs to sit in the chair for a while then stand to  walk.  Continue to follow fever curve.  Hopefully home later in the week.   Marca Ancona 09/23/2013

## 2013-09-24 ENCOUNTER — Inpatient Hospital Stay (HOSPITAL_COMMUNITY): Payer: Medicare Other

## 2013-09-24 ENCOUNTER — Encounter (HOSPITAL_COMMUNITY): Payer: Medicare Other

## 2013-09-24 ENCOUNTER — Encounter: Payer: Medicare Other | Admitting: Internal Medicine

## 2013-09-24 ENCOUNTER — Encounter (HOSPITAL_COMMUNITY): Payer: Self-pay | Admitting: Radiology

## 2013-09-24 DIAGNOSIS — R509 Fever, unspecified: Secondary | ICD-10-CM

## 2013-09-24 DIAGNOSIS — I739 Peripheral vascular disease, unspecified: Secondary | ICD-10-CM

## 2013-09-24 DIAGNOSIS — M129 Arthropathy, unspecified: Secondary | ICD-10-CM

## 2013-09-24 DIAGNOSIS — R079 Chest pain, unspecified: Secondary | ICD-10-CM

## 2013-09-24 DIAGNOSIS — I2510 Atherosclerotic heart disease of native coronary artery without angina pectoris: Secondary | ICD-10-CM

## 2013-09-24 LAB — CMV ANTIBODY, IGG (EIA): CMV Ab - IgG: >10 U/mL — ABNORMAL HIGH (ref <0.60)

## 2013-09-24 LAB — CBC WITH DIFFERENTIAL/PLATELET
Basophils Absolute: 0.1 10*3/uL (ref 0.0–0.1)
Basophils Relative: 1 % (ref 0–1)
Eosinophils Relative: 2 % (ref 0–5)
Lymphs Abs: 1 10*3/uL (ref 0.7–4.0)
MCHC: 34.5 g/dL (ref 30.0–36.0)
Neutro Abs: 3.2 10*3/uL (ref 1.7–7.7)
Neutrophils Relative %: 59 % (ref 43–77)
RDW: 14.9 % (ref 11.5–15.5)

## 2013-09-24 LAB — HIV-1 RNA QUANT-NO REFLEX-BLD
HIV 1 RNA Quant: <20 copies/mL (ref <20)
HIV-1 RNA Quant, Log: <1.3 {Log} (ref <1.30)

## 2013-09-24 LAB — QUANTIFERON TB GOLD ASSAY (BLOOD): TB Antigen Minus Nil Value: 0.01 IU/mL

## 2013-09-24 LAB — TROPONIN I: Troponin I: <0.3 ng/mL (ref <0.30)

## 2013-09-24 LAB — ANA: Anti Nuclear Antibody(ANA): NEGATIVE

## 2013-09-24 LAB — EPSTEIN-BARR VIRUS VCA ANTIBODY PANEL: EBV VCA IgG: >750 U/mL — ABNORMAL HIGH (ref <18.0)

## 2013-09-24 LAB — BASIC METABOLIC PANEL
CO2: 24 mEq/L (ref 19–32)
Chloride: 101 mEq/L (ref 96–112)
GFR calc Af Amer: 56 mL/min — ABNORMAL LOW (ref >90)
GFR calc non Af Amer: 49 mL/min — ABNORMAL LOW (ref >90)
Potassium: 4.4 mEq/L (ref 3.5–5.1)
Sodium: 134 mEq/L — ABNORMAL LOW (ref 135–145)

## 2013-09-24 MED ORDER — SODIUM CHLORIDE 0.9 % IV SOLN
1.0000 g | Freq: Three times a day (TID) | INTRAVENOUS | Status: DC
  Administered 2013-09-24 – 2013-09-29 (×15): 1 g via INTRAVENOUS
  Filled 2013-09-24 (×20): qty 1

## 2013-09-24 MED ORDER — VANCOMYCIN HCL IN DEXTROSE 1-5 GM/200ML-% IV SOLN
1000.0000 mg | Freq: Two times a day (BID) | INTRAVENOUS | Status: DC
  Administered 2013-09-24 – 2013-09-26 (×4): 1000 mg via INTRAVENOUS
  Filled 2013-09-24 (×5): qty 200

## 2013-09-24 MED ORDER — BIOTENE DRY MOUTH MT LIQD
15.0000 mL | Freq: Two times a day (BID) | OROMUCOSAL | Status: DC
  Administered 2013-09-24 – 2013-09-29 (×8): 15 mL via OROMUCOSAL

## 2013-09-24 MED ORDER — SODIUM CHLORIDE 0.9 % IV BOLUS (SEPSIS)
250.0000 mL | Freq: Once | INTRAVENOUS | Status: AC
  Administered 2013-09-24: 250 mL via INTRAVENOUS

## 2013-09-24 MED ORDER — IOHEXOL 300 MG/ML  SOLN
80.0000 mL | Freq: Once | INTRAMUSCULAR | Status: AC | PRN
  Administered 2013-09-24: 80 mL via INTRAVENOUS

## 2013-09-24 MED ORDER — FENTANYL CITRATE 0.05 MG/ML IJ SOLN
25.0000 ug | INTRAMUSCULAR | Status: DC | PRN
Start: 2013-09-24 — End: 2013-09-29
  Administered 2013-09-24 – 2013-09-25 (×5): 25 ug via INTRAVENOUS
  Filled 2013-09-24 (×7): qty 2

## 2013-09-24 MED ORDER — IOHEXOL 300 MG/ML  SOLN
25.0000 mL | INTRAMUSCULAR | Status: AC
  Administered 2013-09-24 (×2): 25 mL via ORAL

## 2013-09-24 NOTE — Progress Notes (Signed)
INFECTIOUS DISEASE PROGRESS NOTE  ID: [name] is a 72 y.o. male with  Active Problems:   Fever   Sepsis(995.91)   Weakness generalized   Other malaise and fatigue   Palliative care encounter   Subjective: 72 yo M with CAD, previous ICD, adm on 11-11 with CP, underwent cardioversion. He developed fever in hospital on 11-15. He underwent CT chest/abd/pelvis- ? Appendix thickening. He was started on anbx on 11-15 and his quinidine was stopped. TEE (-). He became afebrile and his BCx were (-). Anbx were stopped on 11-19 (after 5 days of vanco, 2 days of imipenem).  Last PM he developed fever to 102.2.    this AM he is without specific complaint- no change in BM, no joint pain, no dysuria. Has cough which has been chronic and non-productive.   Abtx:  Anti-infectives   Start     Dose/Rate Route Frequency Ordered Stop   09/22/13 2000  vancomycin (VANCOCIN) IVPB 1000 mg/200 mL premix  Status:  Discontinued     1,000 mg 200 mL/hr over 60 Minutes Intravenous Every 12 hours 09/22/13 1310 09/23/13 0921   09/22/13 1000  imipenem-cilastatin (PRIMAXIN) 500 mg in sodium chloride 0.9 % 100 mL IVPB  Status:  Discontinued     500 mg 200 mL/hr over 30 Minutes Intravenous 3 times per day 09/22/13 0917 09/23/13 0921   09/21/13 1700  piperacillin-tazobactam (ZOSYN) IVPB 3.375 g  Status:  Discontinued     3.375 g 12.5 mL/hr over 240 Minutes Intravenous Every 8 hours 09/21/13 0824 09/22/13 0831   09/21/13 0930  piperacillin-tazobactam (ZOSYN) IVPB 3.375 g     3.375 g 100 mL/hr over 30 Minutes Intravenous  Once 09/21/13 0824 09/21/13 0954   09/19/13 2100  cefTRIAXone (ROCEPHIN) 1 g in dextrose 5 % 50 mL IVPB  Status:  Discontinued     1 g 100 mL/hr over 30 Minutes Intravenous Every 24 hours 09/19/13 2029 09/21/13 0806   09/19/13 2100  vancomycin (VANCOCIN) IVPB 750 mg/150 ml premix  Status:  Discontinued     750 mg 150 mL/hr over 60 Minutes Intravenous Every 12 hours 09/19/13 2029 09/22/13  1310      Medications:  Scheduled: . amiodarone  400 mg Oral Daily  . aspirin EC  81 mg Oral Daily  . atorvastatin  80 mg Oral Daily  . feeding supplement (RESOURCE BREEZE)  1 Container Oral BID BM  . fentaNYL  25 mcg Intravenous Once  . heparin  5,000 Units Subcutaneous Q8H  . levothyroxine  112 mcg Oral QAC breakfast  . midazolam  2 mg Intravenous Once  . sodium chloride  3 mL Intravenous Q12H    Objective: Vital signs in last 24 hours: Temp:  [97.9 F (36.6 C)-102.2 F (39 C)] 98.8 F (37.1 C) (11/20 0753) Pulse Rate:  [69-72] 69 (11/20 0908) Resp:  [12-20] 16 (11/20 0908) BP: (89-121)/(46-69) 102/46 mmHg (11/20 0908) SpO2:  [95 %-99 %] 95 % (11/20 0908) Weight:  [86.773 kg (191 lb 4.8 oz)] 86.773 kg (191 lb 4.8 oz) (11/20 0500)   General appearance: alert, cooperative and no distress Neck: no adenopathy and supple, symmetrical, trachea midline Resp: clear to auscultation bilaterally and mild sternal tenderness. ICD is non-tender, non-fluctuant.  Cardio: regular rate and rhythm GI: normal findings: bowel sounds normal and soft, non-tender Extremities: edema none and no cordis in UE or LE. non-tender.  Lymph nodes: Cervical adenopathy: none and Axillary adenopathy: none  Lab Results  Recent Labs  09/22/13  1610 09/23/13 0548 09/24/13 0500  WBC 4.0 3.6* 5.4  HGB 12.0* 11.1* 11.3*  HCT 35.6* 32.7* 32.8*  NA 136 134*  --   K 4.6 4.1  --   CL 100 101  --   CO2 28 27  --   BUN 18 17  --   CREATININE 1.56* 1.42*  --    Liver Panel  Recent Labs  09/22/13 0817 09/23/13 0548  PROT 6.3 5.8*  ALBUMIN 3.0* 2.7*  AST 221* 171*  ALT 206* 193*  ALKPHOS 157* 165*  BILITOT 0.7 0.6   Sedimentation Rate  Recent Labs  09/23/13 0548  ESRSEDRATE 21*   C-Reactive Protein  Recent Labs  09/23/13 0548  CRP 6.1*    Microbiology: Recent Results (from the past 240 hour(s))  MRSA PCR SCREENING     Status: None   Collection Time    09/15/13  7:12 AM       Result Value Range Status   MRSA by PCR NEGATIVE  NEGATIVE Final   Comment:            The GeneXpert MRSA Assay (FDA     approved for NASAL specimens     only), is one component of a     comprehensive MRSA colonization     surveillance program. It is not     intended to diagnose MRSA     infection nor to guide or     monitor treatment for     MRSA infections.  CULTURE, BLOOD (ROUTINE X 2)     Status: None   Collection Time    09/19/13  9:25 PM      Result Value Range Status   Specimen Description BLOOD RIGHT HAND   Final   Special Requests BOTTLES DRAWN AEROBIC ONLY 1CC   Final   Culture  Setup Time     Final   Value: 09/20/2013 00:19     Performed at [facility]   Culture     Final   Value:        BLOOD CULTURE RECEIVED NO GROWTH TO DATE CULTURE WILL BE HELD FOR 5 DAYS BEFORE ISSUING A FINAL NEGATIVE REPORT     Performed at [facility]   Report Status PENDING   Incomplete  CULTURE, BLOOD (ROUTINE X 2)     Status: None   Collection Time    09/19/13  9:30 PM      Result Value Range Status   Specimen Description BLOOD LEFT HAND   Final   Special Requests BOTTLES DRAWN AEROBIC ONLY 1CC   Final   Culture  Setup Time     Final   Value: 09/20/2013 00:19     Performed at [facility]   Culture     Final   Value:        BLOOD CULTURE RECEIVED NO GROWTH TO DATE CULTURE WILL BE HELD FOR 5 DAYS BEFORE ISSUING A FINAL NEGATIVE REPORT     Performed at [facility]   Report Status PENDING   Incomplete  URINE CULTURE     Status: None   Collection Time    09/19/13 10:13 PM      Result Value Range Status   Specimen Description URINE, CLEAN CATCH   Final   Special Requests Normal   Final   Culture  Setup Time     Final   Value: 09/20/2013 00:22     Performed at [facility]  Final   Value: NO GROWTH     Performed at [facility]   Report Status 09/20/2013 FINAL   Final  RESPIRATORY VIRUS PANEL     Status: None    Collection Time    09/20/13  5:49 AM      Result Value Range Status   Source - RVPAN NASAL SWAB   Corrected   Comment: CORRECTED ON 11/16 AT 2254: PREVIOUSLY REPORTED AS NASAL SWAB   Respiratory Syncytial Virus A NOT DETECTED   Final   Respiratory Syncytial Virus B NOT DETECTED   Final   Influenza A NOT DETECTED   Final   Influenza B NOT DETECTED   Final   Parainfluenza 1 NOT DETECTED   Final   Parainfluenza 2 NOT DETECTED   Final   Parainfluenza 3 NOT DETECTED   Final   Metapneumovirus NOT DETECTED   Final   Rhinovirus NOT DETECTED   Final   Adenovirus NOT DETECTED   Final   Influenza A H1 NOT DETECTED   Final   Influenza A H3 NOT DETECTED   Final   Comment: (NOTE)           Normal Reference Range for each Analyte: NOT DETECTED     Testing performed using the Luminex xTAG Respiratory Viral Panel test     kit.     This test was developed and its performance characteristics determined     by [facility]. It has not been cleared or approved by the [place]     Food and Drug Administration. This test is used for clinical purposes.     It should not be regarded as investigational or for research. This     laboratory is certified under the Clinical Laboratory Improvement     Amendments of 1988 (CLIA) as qualified to perform high complexity     clinical laboratory testing.     Performed at [facility]  CULTURE, BLOOD (ROUTINE X 2)     Status: None   Collection Time    09/22/13  8:35 AM      Result Value Range Status   Specimen Description BLOOD LEFT ARM   Final   Special Requests BOTTLES DRAWN AEROBIC AND ANAEROBIC 10CC   Final   Culture  Setup Time     Final   Value: 09/22/2013 14:06     Performed at [facility]   Culture     Final   Value:        BLOOD CULTURE RECEIVED NO GROWTH TO DATE CULTURE WILL BE HELD FOR 5 DAYS BEFORE ISSUING A FINAL NEGATIVE REPORT     Performed at [facility]   Report Status PENDING   Incomplete  CULTURE, BLOOD (ROUTINE X  2)     Status: None   Collection Time    09/22/13  8:50 AM      Result Value Range Status   Specimen Description BLOOD LEFT HAND   Final   Special Requests BOTTLES DRAWN AEROBIC ONLY 8CC   Final   Culture  Setup Time     Final   Value: 09/22/2013 14:06     Performed at [facility]   Culture     Final   Value:        BLOOD CULTURE RECEIVED NO GROWTH TO DATE CULTURE WILL BE HELD FOR 5 DAYS BEFORE ISSUING A FINAL NEGATIVE REPORT     Performed at [facility]   Report Status PENDING  Incomplete    Studies/Results: No results found.   Assessment/Plan: FUO CAD  Ischemic CM with ICD PVD Arthritis  Will restart his anbx Would stop his statin Consider LE and UE dopplers Consider u/s of his appendix         Johny Sax Infectious Diseases (pager) [phone] [email or web address] 09/24/2013, 10:01 AM  LOS: 9 days

## 2013-09-24 NOTE — Progress Notes (Signed)
Notified Frank Becket, NP of labs, troponin negative, BMET WNL. Pt remains same with pain 8-9/10, but rest with fentanyl. Informed CTs to be done after abd U/S completed this afternoon.

## 2013-09-24 NOTE — Progress Notes (Signed)
*  PRELIMINARY RESULTS* Vascular Ultrasound Lower extremity venous duplex has been completed.  Preliminary findings: Bilateral:  No evidence of DVT, superficial thrombosis, or Baker's Cyst.  Consistent with  Negative venous duplex from 09/22/13  Farrel Demark, RDMS, RVT  09/24/2013, 11:25 AM

## 2013-09-24 NOTE — Progress Notes (Addendum)
Pt now states his pain is in mid chest, points to sternal area and states "it goes from my left shoulder across my left chest to mid chest (sternum).  States pain 9/10, sharp. Pt given SL NTG tab @ 0902, BP 116/54.  No change in pain, @ 0907 BP at 102/46, 2nd SL NTG given.  @ 0912 no change in pain.  @ 0915 BP 82/44, Dr Gala Romney and Tonye Becket, NP here with pt.  Updated on pain location per pt and NTG given with no relief.

## 2013-09-24 NOTE — Progress Notes (Signed)
Pt pain level 8/10, given fentanyl IV per orders. See pain documentation flowsheet

## 2013-09-24 NOTE — Progress Notes (Signed)
error 

## 2013-09-24 NOTE — Progress Notes (Signed)
Nausea subsided, oral CT contrast started, pt instructed to take small sips and call if nausea or vomitting recur.

## 2013-09-24 NOTE — Progress Notes (Addendum)
Advanced Heart Failure Rounding Note   Subjective:    [name] is a 72 yo man with an ischemic CM s/p ICD implant, chronic systolic HF (EF 20-25%), paroxysmal VT s/p ablation (09/2011) and repeat (06/2013), CAD, PVD (s/p R fem pop BPG 01/2010; s/p L CFA BPG 05/2002), AAA (s/p repair 04/2002), CAS (s/p L CEA 2008), HTN, arthritis, and TIA.   ECHO 09/2013: EF 20-25%, mild AI, mild MR, LA mildly dilated and RV systolic fx mildly reduced.   Over the past 2 months has experienced multiple episodes of Vtach with ICD shocks.He was last admitted to the hosptial 09/06/13-09/09/13 for recurrent VT s/p ICD shock and Qunidine was started. He was supposed to have a CPX test on the OP side and be seen in the HF clinic how presented back to the ED 09/15/13 with CP and was found to be in Vtach rate of 113, which he was cardioverted successfully.   CPX 09/16/13 Peak VO2 10.9 VE/VCO2 48 RER 1.11 Ve/MVV 43%  RHC 11/13 RA = 4  RV = 42/4/5  PA = 44/22 (31)  PCW = 19 v = 24  Fick cardiac output/index = 5.45/2.6  Thermo CO/CI = 3.8/1.8  PVR = 2.2 WU (Fick)  FA sat = 92%  PA sat = 60%, 60%  SVC = 61%  ECHO EF 20-25% RV mildly dilated  Peak PA pressure 61  Quinidine stopped 11/18. Yesterday carvedilol 3.125 mg started bid. Unable to ambulate due to orthostatic hypotension SBP 70s standing for 30 sec.  Tmax 102. Complaining of fatigue. Also c/o left shoulder and chest pain. 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 09/22/13 TEE no vegetation  Objective:   Weight Range:  Vital Signs:   Temp:  [97.9 F (36.6 C)-102.2 F (39 C)] 98.8 F (37.1 C) (11/20 0753) Pulse Rate:  [69-72] 69 (11/20 0908) Resp:  [12-20] 16 (11/20 0908) BP: (89-121)/(46-69) 102/46 mmHg  (11/20 0908) SpO2:  [95 %-99 %] 95 % (11/20 0908) Weight:  [86.773 kg (191 lb 4.8 oz)] 86.773 kg (191 lb 4.8 oz) (11/20 0500) Last BM Date: 09/20/13  Weight change: Filed Weights   09/22/13 0545 09/23/13 0635 09/24/13 0500  Weight: 85.639 kg (188 lb 12.8 oz) 85.1 kg (187 lb 9.8 oz) 86.773 kg (191 lb 4.8 oz)    Intake/Output:   Intake/Output Summary (Last 24 hours) at 09/24/13 0936 Last data filed at 09/24/13 0750  Gross per 24 hour  Intake    720 ml  Output    905 ml  Net   -185 ml     Physical Exam: General: Pale, dyspneic and weak; lying in bed  HEENT: normal; L CEA scar Neck: supple. JVP 7. Carotids 2+ bilat; no bruits. No lymphadenopathy or thryomegaly appreciated.  Cor: PMI nondisplaced. Regular rate & rhythm. No rubs, gallops or murmurs.  Lungs: clear  Abdomen: soft, nontender, nondistended. No hepatosplenomegaly. No bruits or masses. Good bowel sounds.  Extremities: no cyanosis, clubbing, rash, edema  Neuro: alert & orientedx3, cranial nerves grossly intact. moves all 4 extremities w/o difficulty. Affect pleasant  Telemetry: A paced 80s  Labs: Basic Metabolic Panel:  Recent Labs Lab 09/18/13 1525  09/20/13 1200 09/21/13 0430 09/22/13 0817 09/23/13 0548  NA 134*  --  134* 133* 136 134*  K 4.1  --  4.4 4.6 4.6 4.1  CL 106  --  99 97 100 101  CO2  --   --  24 25 28 27   GLUCOSE 111*  --  84 96 100* 101*  BUN 19  --  20 21 18 17   CREATININE 1.70*  --  1.74* 1.76* 1.56* 1.42*  CALCIUM  --   < > 8.8 8.7 8.9 8.6  < > = values in this interval not displayed.  Liver Function Tests:  Recent Labs Lab 09/20/13 1200 09/21/13 0430 09/22/13 0817 09/23/13 0548  AST 100* 163* 221* 171*  ALT 82* 134* 206* 193*  ALKPHOS 131* 138* 157* 165*  BILITOT 0.6 0.6 0.7 0.6  PROT 6.6 6.4 6.3 5.8*  ALBUMIN 3.2* 3.1* 3.0* 2.7*   No results found for this basename: LIPASE, AMYLASE,  in the last 168 hours No results found for this basename: AMMONIA,  in the last 168  hours  CBC:  Recent Labs Lab 09/20/13 1200 09/21/13 0430 09/22/13 0817 09/23/13 0548 09/24/13 0500  WBC 4.4 3.5* 4.0 3.6* 5.4  NEUTROABS 3.4 2.5 2.6 2.1 3.2  HGB 12.2* 11.5* 12.0* 11.1* 11.3*  HCT 36.9* 34.0* 35.6* 32.7* 32.8*  MCV 98.9 97.7 97.8 97.9 96.5  PLT 112* 94* 105* 101* 128*    Cardiac Enzymes:  Recent Labs Lab 09/23/13 0548  CKTOTAL 43    BNP: BNP (last 3 results)  Recent Labs  11/23/12 0901 09/15/13 0424 09/15/13 0750  PROBNP 1891.0* 2704.0* 3038.0*    Imaging: No results found.   Medications:     Scheduled Medications: . amiodarone  400 mg Oral Daily  . aspirin EC  81 mg Oral Daily  . atorvastatin  80 mg Oral Daily  . feeding supplement (RESOURCE BREEZE)  1 Container Oral BID BM  . fentaNYL  25 mcg Intravenous Once  . heparin  5,000 Units Subcutaneous Q8H  . levothyroxine  112 mcg Oral QAC breakfast  . midazolam  2 mg Intravenous Once  . sodium chloride  250 mL Intravenous Once  . sodium chloride  3 mL Intravenous Q12H    Infusions: . sodium chloride 20 mL/hr at 09/22/13 0909    PRN Medications: acetaminophen, acetaminophen, fentaNYL, nitroGLYCERIN, ondansetron (ZOFRAN) IV, sodium chloride   Assessment:   1) Recurrent VTach, s/p ICD shock  --ablation 09/2011 and 06/2013  2) Chronic systolic HF, EF 20-25% (09/2013)  3) Mixed NICM/ICM  4) CAD  --s/p previous inferolateral MI.  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 Antibiotics stopped yesterday. Quinidine also stopped. Now with recurrent fevers with T Max 102. No localizing features. Blood and Urine cultures - No growth.  Nasal Swab negative for influenza. TEE no vegetation.   Will resume vanc/imipenem and add fungal coverage. ID to see.   Also c/o left shoulder and upper chest pain. Transamianses persistently  elevated in 100-200 range. Bili ok. Gallbladder not sore on exam. Will repeat CT chest and abdomen to exclude PE and GB pathology. (discussed risk of contrast nephropathy) Cycle cardiac markers.   No VT on tele.  Continue amiodarone 400 mg daily. Quinidine stopped 11/18.     We continue to work toward VAD but need to resolve acute issues first obviously.   The patient is critically ill with  multiple organ systems failure and requires high complexity decision making for assessment and support, frequent evaluation and titration of therapies, application of advanced monitoring technologies and extensive interpretation of multiple databases.   Critical Care Time devoted to patient care services described in this note is 35 Minutes.  Alicianna Litchford,MD 9:47 AM

## 2013-09-24 NOTE — Progress Notes (Signed)
abd U/S d/c'd per Dr Ninetta Lights since pt having CT chest and abd.  Informed CT department will start oral contrast after nausea subsides and pt can tolerate contrast, and will call CT once contrast started.

## 2013-09-24 NOTE — Progress Notes (Signed)
ANTIBIOTIC CONSULT NOTE - FOLLOW UP  Pharmacy Consult for Vancomycin and Meropenem Indication: FUO  Allergies  Allergen Reactions  . Meperidine Hcl Other (See Comments)    "CAN'T MOVE" CATATONIC PER PT.  [name] Opium Other (See Comments)    "CAN'T MOVE" CATATONIC PER PT.    Patient Measurements: Height: 6' (182.9 cm) Weight: 191 lb 4.8 oz (86.773 kg) IBW/kg (Calculated) : 77.6  Vital Signs: Temp: 98.8 F (37.1 C) (11/20 0753) Temp src: Oral (11/20 0753) BP: 102/46 mmHg (11/20 0908) Pulse Rate: 69 (11/20 0908) Intake/Output from previous day: 11/19 0701 - 11/20 0700 In: 1080 [P.O.:1080] Out: 1005 [Urine:1005] Intake/Output from this shift: Total I/O In: 250 [IV Piggyback:250] Out: 375 [Urine:375]  Labs:  Recent Labs  09/22/13 0817 09/23/13 0548 09/24/13 0500 09/24/13 0945  WBC 4.0 3.6* 5.4  --   HGB 12.0* 11.1* 11.3*  --   PLT 105* 101* 128*  --   CREATININE 1.56* 1.42*  --  1.39*   Estimated Creatinine Clearance: 52 ml/min (by C-G formula based on Cr of 1.39).  Recent Labs  09/22/13 0817  VANCOTROUGH 12.3    Assessment: 71yom with ICM s/p ICD implant admitted 11/11 with Vtach/CP developed a fever in hospital on 11/15. He was started on antibiotics with concern for sepsis/bacteremia/endocarditis. TEE was negative for vegetations/endocarditis and all cultures have been negative thus far. ID stopped his antibiotics yesterday, however, he continues to spike fevers - Tmax 102.2 today so vancomycin and meropenem to resume. CT abdomen/chest pending.  Zosyn 11/17>11/17 Primaxin 11/17>11/19 Vancomycin 11/15>11/19> resume 11/20> Rocephin 11/15>11/17  Meropenem 11/20>  11/5 blood x2 - ngtd 11/15 urine - negative 11/18 blood x2 - ngtd  Previously subtherapeutic (VT = 12.3) on vancomycin 750mg  q12. Dose was increased to 1g q12 but follow up trough never obtained. Renal function improving with sCr down to 1.39 and CrCl 52 ml/min.  Goal of Therapy:  Vancomycin trough  level 15-20 mcg/ml  Plan:  1) Vancomycin 1g IV q12 2) Meropenem 1g IV q8 3) Follow up fever curve, cultures, renal function, trough at ss  Fredrik Rigger 09/24/2013,11:33 AM

## 2013-09-24 NOTE — Progress Notes (Signed)
Pt not appropriate for ambulation today. Will hold. Ethelda Chick CES, ACSM 12:10 PM 09/24/2013

## 2013-09-24 NOTE — Progress Notes (Signed)
NS IV bolus 250cc started per order.

## 2013-09-24 NOTE — Progress Notes (Signed)
Pt has 2/3 of cup CT contrast down, CT department notified at this time that contrast started at 2pm and pt able to tolerate so far, plan start 2nd cup at 1500, CT plans to get pt around 4pm for scans.

## 2013-09-24 NOTE — Progress Notes (Addendum)
Pt c/o aching  pain to anterior side of  L shoulder and L subclavian area, pt points to and rubs these two areas.  Wanted to take his tylenol, had last at 0507, not time, Tonye Becket, NP paged to request other pain medication. NP ordered  EKG and troponin STAT, both departments called at this time for STAT orders.   NP and MD will be up to see pt, no further pain meds ordered at this time.

## 2013-09-24 NOTE — Progress Notes (Signed)
Had fever again today. T max 102  Received call from staff nurse CP 9/10 BP 72/44.  Received 2 sublingual nitro with no relief.  Cycle enzymes . EKG obtained and unchanged. Given 250 cc NS bolus.   Get CT chest and abdomen. Dr Bensimhion at bedside and agrees with plan.   CLEGG,AMY NP-C 9:29 AM  Agree  Truman Hayward 9:53 AM

## 2013-09-24 NOTE — Progress Notes (Signed)
NUTRITION FOLLOW UP  Intervention:   1.  Supplements; Continue Resource Breeze po TID, each supplement provides 250 kcal and 9 grams of protein one diet resumed. 2.  RD to follow for care plan and will make recommendations as needed.  Pt has overall maintained decent intake until recent episodes of nausea and vomiting.    New Nutrition Dx:   Increased nutrient needs related to concern for sepsis as evidenced by estimated nutrition needs, ongoing  Goal:   Pt to meet >/= 90% of their estimated nutrition needs, Not met.   Monitor:   PO & supplemental intake, weight, labs, I/O's  Assessment:   Patient with PMH of CAD, HTN, chronic systolic HF and stroke; presented with acute onset of chest pain; episode began while patient was asleep early AM of 11/11 and persisted for several hours; in ED was tachycardic.   Patient s/p procedure 11/13:  RIGHT HEART CATHETERIZATION  Patient schedule for LVAD procedure in December 2014.  Pt with ongoing fevers, source remains unknown. Pt previously eating well at 80-100% of meals consistently. Now NPO with nausea/vomiting. Awaiting CT this afternoon.     Height: Ht Readings from Last 1 Encounters:  09/15/13 6' (1.829 m)    Weight Status:   Wt Readings from Last 1 Encounters:  09/24/13 191 lb 4.8 oz (86.773 kg)    Body mass index is 25.94 kg/(m^2).  Re-estimated needs:  Kcal: 2100-2300 Protein: 110-120 gm Fluid: 2.1-2.3 L  Skin: Intact  Diet Order: NPO   Intake/Output Summary (Last 24 hours) at 09/24/13 1426 Last data filed at 09/24/13 1224  Gross per 24 hour  Intake    610 ml  Output    975 ml  Net   -365 ml    Labs:   Recent Labs Lab 09/22/13 0817 09/23/13 0548 09/24/13 0945  NA 136 134* 134*  K 4.6 4.1 4.4  CL 100 101 101  CO2 28 27 24   BUN 18 17 19   CREATININE 1.56* 1.42* 1.39*  CALCIUM 8.9 8.6 8.5  GLUCOSE 100* 101* 133*     Scheduled Meds: . amiodarone  400 mg Oral Daily  . aspirin EC  81 mg Oral Daily  .  feeding supplement (RESOURCE BREEZE)  1 Container Oral BID BM  . fentaNYL  25 mcg Intravenous Once  . heparin  5,000 Units Subcutaneous Q8H  . levothyroxine  112 mcg Oral QAC breakfast  . meropenem (MERREM) IV  1 g Intravenous Q8H  . midazolam  2 mg Intravenous Once  . sodium chloride  3 mL Intravenous Q12H  . vancomycin  1,000 mg Intravenous Q12H    Continuous Infusions: . sodium chloride 20 mL/hr at 09/22/13 0909    Loyce Dys, MS RD LDN Clinical Inpatient Dietitian Pager: [phone] Weekend/After hours pager: [phone]

## 2013-09-24 NOTE — Progress Notes (Signed)
Pt vomitted at this time, clear emesis at first, then yellow emsis, no food particles, approx 20cc. Pt given zofran IV. Pain remains same 9/10, fentanyl also given after zofran.

## 2013-09-25 ENCOUNTER — Other Ambulatory Visit (HOSPITAL_COMMUNITY): Payer: Medicare Other

## 2013-09-25 LAB — CBC WITH DIFFERENTIAL/PLATELET
Basophils Absolute: 0 10*3/uL (ref 0.0–0.1)
Eosinophils Absolute: 0.1 10*3/uL (ref 0.0–0.7)
Eosinophils Relative: 1 % (ref 0–5)
HCT: 31.3 % — ABNORMAL LOW (ref 39.0–52.0)
Lymphocytes Relative: 12 % (ref 12–46)
MCH: 32.8 pg (ref 26.0–34.0)
MCV: 97.8 fL (ref 78.0–100.0)
Monocytes Absolute: 1.2 10*3/uL — ABNORMAL HIGH (ref 0.1–1.0)
Neutrophils Relative %: 65 % (ref 43–77)
Platelets: 124 10*3/uL — ABNORMAL LOW (ref 150–400)
RBC: 3.2 MIL/uL — ABNORMAL LOW (ref 4.22–5.81)
RDW: 15.3 % (ref 11.5–15.5)
WBC: 5.7 10*3/uL (ref 4.0–10.5)

## 2013-09-25 LAB — COMPREHENSIVE METABOLIC PANEL
ALT: 125 U/L — ABNORMAL HIGH (ref 0–53)
Albumin: 2.6 g/dL — ABNORMAL LOW (ref 3.5–5.2)
CO2: 27 mEq/L (ref 19–32)
Calcium: 8.3 mg/dL — ABNORMAL LOW (ref 8.4–10.5)
Chloride: 102 mEq/L (ref 96–112)
Creatinine, Ser: 1.37 mg/dL — ABNORMAL HIGH (ref 0.50–1.35)
GFR calc Af Amer: 57 mL/min — ABNORMAL LOW (ref >90)
GFR calc non Af Amer: 50 mL/min — ABNORMAL LOW (ref >90)
Glucose, Bld: 104 mg/dL — ABNORMAL HIGH (ref 70–99)
Potassium: 4.8 mEq/L (ref 3.5–5.1)
Sodium: 135 mEq/L (ref 135–145)
Total Bilirubin: 0.6 mg/dL (ref 0.3–1.2)
Total Protein: 5.6 g/dL — ABNORMAL LOW (ref 6.0–8.3)

## 2013-09-25 LAB — HISTONE ANTIBODIES, IGG, BLOOD: DNA-Histone: 61 Units/mL (ref <100)

## 2013-09-25 NOTE — Progress Notes (Signed)
CARDIAC REHAB PHASE I   PRE:  Rate/Rhythm: 70 Paced  BP:  Supine:   Sitting: 105/44  Standing: 83/52   SaO2: 97 2L 96 RA  MODE:  Ambulation: 270 ft   POST:  Rate/Rhythm: 92 Paced  BP:  Supine:   Sitting: 129/52  Standing:    SaO2: 95 RA 1200-1220 On arrival pt in reclner without c/o. Stood pt BP 83/52, he denies any dizziness. Assisted X 2 used walker and gait belt to ambulate. Gait steady with walker. BP after walk 129/52. Pt back to recliner after walk with call light in reach. O2 left off after walk RA sat 95%. Pt excited that he was able to walk in hall.  Melina Copa RN 09/25/2013 12:20 PM

## 2013-09-25 NOTE — Progress Notes (Addendum)
Advanced Heart Failure Rounding Note   Subjective:    [name] is a 72 yo man with an ischemic CM s/p ICD implant, chronic systolic HF (EF 20-25%), paroxysmal VT s/p ablation (09/2011) and repeat (06/2013), CAD, PVD (s/p R fem pop BPG 01/2010; s/p L CFA BPG 05/2002), AAA (s/p repair 04/2002), CAS (s/p L CEA 2008), HTN, arthritis, and TIA.   Over the past 2 months has experienced multiple episodes of Vtach with ICD shocks.He was last admitted to the hosptial 09/06/13-09/09/13 for recurrent VT s/p ICD shock and Qunidine was started. He was supposed to have a CPX test on the OP side and be seen in the HF clinic how presented back to the ED 09/15/13 with CP and was found to be in Vtach rate of 113, which he was cardioverted successfully. VT felt to belated to severe HF so VAD work-up initiated while in hospital. W/u as follows.   CPX 09/16/13 Peak VO2 10.9 VE/VCO2 48 RER 1.11 Ve/MVV 43%   ECHO 09/2013: EF 20-25%, mild AI, mild MR, LA mildly dilated and RV systolic fx mildly reduced.   RHC 11/13 RA = 4  RV = 42/4/5  PA = 44/22 (31)  PCW = 19 v = 24  Fick cardiac output/index = 5.45/2.6  Thermo CO/CI = 3.8/1.8  PVR = 2.2 WU (Fick)  FA sat = 92%  PA sat = 60%, 60%  SVC = 61%  Was started on quinidine for VT and pendin discharge last weekend when he began spiking fevers to 102   Quinidine stopped 11/18. Cultures have remained negative. CT C/A/P yesterday with ? Aspiration PNA.  Tmax 100.2. ID seeing (thanks)    Has been off HF meds due to hypotension. Had CP/l eft shoulder pain yesterday. CEs negative. Starting to feel a bit better. No CP or dyspnea. + weak. Swallow study negative.   Infectious w/u: CT of chest -no PE  Bibasilar consolidation ? 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 09/22/13 TEE no vegetation  Objective:   Weight Range:  Vital Signs:   Temp:  [98.6 F (37 C)-100.2 F (37.9 C)] 99.4 F (37.4 C) (11/21 0400) Pulse Rate:  [69-79] 69 (11/21 0500) Resp:  [12-26] 15 (11/21 0500) BP: (82-140)/(34-74) 88/34 mmHg (11/21 0500) SpO2:  [89 %-100 %] 93 % (11/21 0500) Last BM Date: 09/23/13  Weight change: Filed Weights   09/22/13 0545 09/23/13 0635 09/24/13 0500  Weight: 188 lb 12.8 oz (85.639 kg) 187 lb 9.8 oz (85.1 kg) 191 lb 4.8 oz (86.773 kg)    Intake/Output:   Intake/Output Summary (Last 24 hours) at 09/25/13 0710 Last data filed at 09/25/13 0338  Gross per 24 hour  Intake   2340 ml  Output    920 ml  Net   1420 ml     Physical Exam: General: Weak NAD sitting in chair  HEENT: normal; L CEA scar Neck: supple. JVP 7. Carotids 2+ bilat; no bruits. No lymphadenopathy or thryomegaly appreciated.  Cor: PMI laterally displaced. Regular rate & rhythm. No rubs, gallops or murmurs.  Lungs: clear  Abdomen: soft, nontender, nondistended. No hepatosplenomegaly. No bruits or masses. Good bowel sounds.  Extremities: no cyanosis, clubbing, rash,  edema  Neuro: alert & orientedx3, cranial nerves grossly intact. moves all 4 extremities w/o difficulty. Affect pleasant  Telemetry: A paced 80s  Labs: Basic Metabolic Panel:  Recent Labs Lab 09/21/13 0430 09/22/13 0817 09/23/13 0548 09/24/13 0945 09/25/13 0441  NA 133* 136 134* 134* 135  K 4.6 4.6 4.1 4.4 4.8  CL 97 100 101 101 102  CO2 25 28 27 24 27   GLUCOSE 96 100* 101* 133* 104*  BUN 21 18 17 19 17   CREATININE 1.76* 1.56* 1.42* 1.39* 1.37*  CALCIUM 8.7 8.9 8.6 8.5 8.3*    Liver Function Tests:  Recent Labs Lab 09/20/13 1200 09/21/13 0430 09/22/13 0817 09/23/13 0548 09/25/13 0441  AST 100* 163* 221* 171* 73*  ALT 82* 134* 206* 193* 125*  ALKPHOS 131* 138* 157* 165* 164*  BILITOT  0.6 0.6 0.7 0.6 0.6  PROT 6.6 6.4 6.3 5.8* 5.6*  ALBUMIN 3.2* 3.1* 3.0* 2.7* 2.6*   No results found for this basename: LIPASE, AMYLASE,  in the last 168 hours No results found for this basename: AMMONIA,  in the last 168 hours  CBC:  Recent Labs Lab 09/21/13 0430 09/22/13 0817 09/23/13 0548 09/24/13 0500 09/25/13 0441  WBC 3.5* 4.0 3.6* 5.4 5.7  NEUTROABS 2.5 2.6 2.1 3.2 3.7  HGB 11.5* 12.0* 11.1* 11.3* 10.5*  HCT 34.0* 35.6* 32.7* 32.8* 31.3*  MCV 97.7 97.8 97.9 96.5 97.8  PLT 94* 105* 101* 128* 124*    Cardiac Enzymes:  Recent Labs Lab 09/23/13 0548 09/24/13 0940  CKTOTAL 43  --   TROPONINI  --  <0.30    BNP: BNP (last 3 results)  Recent Labs  11/23/12 0901 09/15/13 0424 09/15/13 0750  PROBNP 1891.0* 2704.0* 3038.0*    Imaging: Ct Chest W Contrast  09/24/2013   CLINICAL DATA:  Chest pain, nausea and vomiting  EXAM: CT CHEST, ABDOMEN, AND PELVIS WITH CONTRAST  TECHNIQUE: Multidetector CT imaging of the chest, abdomen and pelvis was performed following the standard protocol during bolus administration of intravenous contrast.  CONTRAST:  80mL OMNIPAQUE IOHEXOL 300 MG/ML  SOLN  COMPARISON:  Noncontrast CT 09/18/2013  FINDINGS: CT CHEST FINDINGS  There is centrilobular emphysema again demonstrated in the lungs. There are new bilateral small effusions. There is a small moderate focus of consolidation versus atelectasis at the right lung base. Similar finding at the left lung base but smaller.  No axillary supraclavicular lymphadenopathy. No mediastinal hilar lymphadenopathy. There is a lymph node along the right bronchus intermedius measuring 13 mm (image 31). No evidence of central pulmonary embolism. No evidence of aortic dissection. No pericardial fluid.  CT ABDOMEN AND PELVIS FINDINGS  No focal hepatic lesion. Patient status post cholecystectomy. The pancreas, spleen, and adrenal glands are normal. The kidneys demonstrate mild hypoperfusion however there is excretion  of the IV contrast in the collecting systems on the delayed imaging. No evidence of renal obstruction. There are bilateral nonenhancing renal cysts. There is a large calculus in the mid left kidney measuring 12 mm.  The stomach, small bowel, appendix, and cecum normal. No evidence of appendicitis. The colon and rectosigmoid colon are normal. There are several diverticula of is sigmoid colon without acute inflammation. The sigmoid colon is probably collapsed.  Abdominal aorta is normal caliber. There has been a open repair of the aorta these graft is patent. All  There is no free fluid the pelvis. Prostate gland and bladder normal. No pelvic lymphadenopathy. Review of bone windows demonstrates no aggressive osseous lesions.  IMPRESSION:  1. New mild bibasilar consolidations concerning for aspiration pneumonitis, pneumonia or atelectasis. 2. No evidence of central pulmonary embolism. 3. Post cholecystectomy. 4. Appendix is normal. 5. Mild hypoperfusion of the kidneys but normal excretion. 6. Abdominal aortic graft repair without complication.   Electronically Signed   By: Genevive Bi M.D.   On: 09/24/2013 19:51   Ct Abdomen Pelvis W Contrast  09/24/2013   CLINICAL DATA:  Chest pain, nausea and vomiting  EXAM: CT CHEST, ABDOMEN, AND PELVIS WITH CONTRAST  TECHNIQUE: Multidetector CT imaging of the chest, abdomen and pelvis was performed following the standard protocol during bolus administration of intravenous contrast.  CONTRAST:  80mL OMNIPAQUE IOHEXOL 300 MG/ML  SOLN  COMPARISON:  Noncontrast CT 09/18/2013  FINDINGS: CT CHEST FINDINGS  There is centrilobular emphysema again demonstrated in the lungs. There are new bilateral small effusions. There is a small moderate focus of consolidation versus atelectasis at the right lung base. Similar finding at the left lung base but smaller.  No axillary supraclavicular lymphadenopathy. No mediastinal hilar lymphadenopathy. There is a lymph node along the right bronchus  intermedius measuring 13 mm (image 31). No evidence of central pulmonary embolism. No evidence of aortic dissection. No pericardial fluid.  CT ABDOMEN AND PELVIS FINDINGS  No focal hepatic lesion. Patient status post cholecystectomy. The pancreas, spleen, and adrenal glands are normal. The kidneys demonstrate mild hypoperfusion however there is excretion of the IV contrast in the collecting systems on the delayed imaging. No evidence of renal obstruction. There are bilateral nonenhancing renal cysts. There is a large calculus in the mid left kidney measuring 12 mm.  The stomach, small bowel, appendix, and cecum normal. No evidence of appendicitis. The colon and rectosigmoid colon are normal. There are several diverticula of is sigmoid colon without acute inflammation. The sigmoid colon is probably collapsed.  Abdominal aorta is normal caliber. There has been a open repair of the aorta these graft is patent. All  There is no free fluid the pelvis. Prostate gland and bladder normal. No pelvic lymphadenopathy. Review of bone windows demonstrates no aggressive osseous lesions.  IMPRESSION: 1. New mild bibasilar consolidations concerning for aspiration pneumonitis, pneumonia or atelectasis. 2. No evidence of central pulmonary embolism. 3. Post cholecystectomy. 4. Appendix is normal. 5. Mild hypoperfusion of the kidneys but normal excretion. 6. Abdominal aortic graft repair without complication.   Electronically Signed   By: Genevive Bi M.D.   On: 09/24/2013 19:51     Medications:     Scheduled Medications: . amiodarone  400 mg Oral Daily  . antiseptic oral rinse  15 mL Mouth Rinse BID  . aspirin EC  81 mg Oral Daily  . feeding supplement (RESOURCE BREEZE)  1 Container Oral BID BM  . fentaNYL  25 mcg Intravenous Once  . heparin  5,000 Units Subcutaneous Q8H  . levothyroxine  112 mcg Oral QAC breakfast  . meropenem (MERREM) IV  1 g Intravenous Q8H  . midazolam  2 mg Intravenous Once  . sodium chloride   3 mL Intravenous Q12H  . vancomycin  1,000 mg Intravenous Q12H    Infusions: . sodium chloride Stopped (09/24/13 1800)    PRN Medications: acetaminophen, acetaminophen, fentaNYL, nitroGLYCERIN, ondansetron (ZOFRAN) IV, sodium chloride   Assessment:   1) Recurrent VTach, s/p ICD shock  --ablation 09/2011 and 06/2013  2) Chronic systolic HF, EF 20-25% (09/2013)  3) Mixed NICM/ICM  4) CAD  --s/p previous inferolateral MI.  --Chronically occluded RCA with L-> R collaterals per cath  1/14  5) PAD, severe  6) HTN  7) LBBB 8) Sepsis 9) Hyponatremia 10) Renal cyst (?RCC) 11) Aspiration PNA  Plan/Discussion:    Vancomycin and Meropenem started per ID. Tmax 100.2.   Blood and Urine cultures - No growth.  Nasal Swab negative for influenza. TEE no vegetation. CT  Of abdomen. ? Aspiration will get speech evaluation. Renal function ok.   Volume status ok. Weight stable.  Continue to hold diuretics. No beta blocker due to hypotension.   No VT on tele.  Continue amiodarone 400 mg daily. Quinidine stopped 11/18.     We continue to work toward VAD but need to resolve acute issues first obviously.   CLEGG,AMY NP-C 7:22 AM  Patient seen and examined with Tonye Becket, NP. We discussed all aspects of the encounter. I agree with the assessment and plan as stated above.   He is mildly improved but still with fevers. CT suggests aspiration. Bedside swallow eval negative. Will continue abx per ID (thanks)  Off all HF meds at this time due to hypotension. Can resume slowly.  Continue amio for VT.  Over weekend would keep in SDU. Continue abx per ID. Ambulate and work with PT.  Will continue to consider VAD as he recovers.   Truman Hayward 2:43 PM

## 2013-09-25 NOTE — Progress Notes (Signed)
[facility]  Day # 2 vancomycin (2nd course)  Day # 2 imipenem (2nd course)  Prior to that he got 4 days of vancomycin  2 days of ceftriaxone 1 day of zosyn  And 2 days of imipenem  Other than one day without antibiotics he has been on broad spectrum antibiotics for 5 days   Subjective: C/o fevers last night   Antibiotics:  Anti-infectives   Start     Dose/Rate Route Frequency Ordered Stop   09/24/13 1200  vancomycin (VANCOCIN) IVPB 1000 mg/200 mL premix     1,000 mg 200 mL/hr over 60 Minutes Intravenous Every 12 hours 09/24/13 1112     09/24/13 1200  meropenem (MERREM) 1 g in sodium chloride 0.9 % 100 mL IVPB     1 g 200 mL/hr over 30 Minutes Intravenous 3 times per day 09/24/13 1112     09/22/13 2000  vancomycin (VANCOCIN) IVPB 1000 mg/200 mL premix  Status:  Discontinued     1,000 mg 200 mL/hr over 60 Minutes Intravenous Every 12 hours 09/22/13 1310 09/23/13 0921   09/22/13 1000  imipenem-cilastatin (PRIMAXIN) 500 mg in sodium chloride 0.9 % 100 mL IVPB  Status:  Discontinued     500 mg 200 mL/hr over 30 Minutes Intravenous 3 times per day 09/22/13 0917 09/23/13 0921   09/21/13 1700  piperacillin-tazobactam (ZOSYN) IVPB 3.375 g  Status:  Discontinued     3.375 g 12.5 mL/hr over 240 Minutes Intravenous Every 8 hours 09/21/13 0824 09/22/13 0831   09/21/13 0930  piperacillin-tazobactam (ZOSYN) IVPB 3.375 g     3.375 g 100 mL/hr over 30 Minutes Intravenous  Once 09/21/13 0824 09/21/13 0954   09/19/13 2100  cefTRIAXone (ROCEPHIN) 1 g in dextrose 5 % 50 mL IVPB  Status:  Discontinued     1 g 100 mL/hr over 30 Minutes Intravenous Every 24 hours 09/19/13 2029 09/21/13 0806   09/19/13 2100  vancomycin (VANCOCIN) IVPB 750 mg/150 ml premix  Status:  Discontinued     750 mg 150 mL/hr over 60 Minutes Intravenous Every 12 hours 09/19/13 2029 09/22/13 1310      Medications: Scheduled Meds: . amiodarone  400 mg Oral Daily  . antiseptic oral rinse   15 mL Mouth Rinse BID  . aspirin EC  81 mg Oral Daily  . feeding supplement (RESOURCE BREEZE)  1 Container Oral BID BM  . fentaNYL  25 mcg Intravenous Once  . heparin  5,000 Units Subcutaneous Q8H  . levothyroxine  112 mcg Oral QAC breakfast  . meropenem (MERREM) IV  1 g Intravenous Q8H  . midazolam  2 mg Intravenous Once  . sodium chloride  3 mL Intravenous Q12H  . vancomycin  1,000 mg Intravenous Q12H   Continuous Infusions: . sodium chloride Stopped (09/24/13 1800)   PRN Meds:.acetaminophen, fentaNYL, nitroGLYCERIN, ondansetron (ZOFRAN) IV, sodium chloride   Objective: Weight change: -6.1 oz (-0.173 kg)  Intake/Output Summary (Last 24 hours) at 09/25/13 1243 Last data filed at 09/25/13 1149  Gross per 24 hour  Intake   1930 ml  Output    725 ml  Net   1205 ml   Blood pressure 102/49, pulse 74, temperature 98.4 F (36.9 C), temperature source Oral, resp. rate 15, height 6' (1.829 m), weight 190 lb 14.7 oz (86.6 kg), SpO2 97.00%. Temp:  [98.4 F (36.9 C)-100.2 F (37.9 C)] 98.4 F (36.9 C) (11/21 0800) Pulse Rate:  [69-78] 74 (11/21 1100) Resp:  [13-23] 15 (  11/21 1100) BP: (88-119)/(33-70) 102/49 mmHg (11/21 1100) SpO2:  [89 %-99 %] 97 % (11/21 1100) Weight:  [190 lb 14.7 oz (86.6 kg)] 190 lb 14.7 oz (86.6 kg) (11/21 0700)  Physical Exam: General: Alert and awake, oriented x3, not in any acute distress.  HEENT: anicteric sclera,  meningismum , EOMI, oropharynx clear and without exudate  CVS regular rate, normal r, no murmur rubs or gallops  Chest:dec breath sounds at the bases Abdomen: soft nontender, nondistended, normal bowel sounds,  Extremities: no clubbing or edema noted bilaterally  Skin: no rashes, ICD site is clean  Neuro: nonfocal, strength and sensation intact   Lab Results:  Recent Labs  09/24/13 0500 09/25/13 0441  WBC 5.4 5.7  HGB 11.3* 10.5*  HCT 32.8* 31.3*  PLT 128* 124*    BMET  Recent Labs  09/24/13 0945 09/25/13 0441  NA 134* 135   K 4.4 4.8  CL 101 102  CO2 24 27  GLUCOSE 133* 104*  BUN 19 17  CREATININE 1.39* 1.37*  CALCIUM 8.5 8.3*    Micro Results: Recent Results (from the past 240 hour(s))  CULTURE, BLOOD (ROUTINE X 2)     Status: None   Collection Time    09/19/13  9:25 PM      Result Value Range Status   Specimen Description BLOOD RIGHT HAND   Final   Special Requests BOTTLES DRAWN AEROBIC ONLY 1CC   Final   Culture  Setup Time     Final   Value: 09/20/2013 00:19     Performed at [facility]   Culture     Final   Value:        BLOOD CULTURE RECEIVED NO GROWTH TO DATE CULTURE WILL BE HELD FOR 5 DAYS BEFORE ISSUING A FINAL NEGATIVE REPORT     Performed at [facility]   Report Status PENDING   Incomplete  CULTURE, BLOOD (ROUTINE X 2)     Status: None   Collection Time    09/19/13  9:30 PM      Result Value Range Status   Specimen Description BLOOD LEFT HAND   Final   Special Requests BOTTLES DRAWN AEROBIC ONLY 1CC   Final   Culture  Setup Time     Final   Value: 09/20/2013 00:19     Performed at [facility]   Culture     Final   Value:        BLOOD CULTURE RECEIVED NO GROWTH TO DATE CULTURE WILL BE HELD FOR 5 DAYS BEFORE ISSUING A FINAL NEGATIVE REPORT     Performed at [facility]   Report Status PENDING   Incomplete  URINE CULTURE     Status: None   Collection Time    09/19/13 10:13 PM      Result Value Range Status   Specimen Description URINE, CLEAN CATCH   Final   Special Requests Normal   Final   Culture  Setup Time     Final   Value: 09/20/2013 00:22     Performed at [facility]   Culture     Final   Value: NO GROWTH     Performed at [facility]   Report Status 09/20/2013 FINAL   Final  RESPIRATORY VIRUS PANEL     Status: None   Collection Time    09/20/13  5:49 AM      Result Value Range Status   Source - [facility]AN NASAL SWAB  Corrected   Comment: CORRECTED ON 11/16 AT 2254: PREVIOUSLY REPORTED AS NASAL SWAB   Respiratory  Syncytial Virus A NOT DETECTED   Final   Respiratory Syncytial Virus B NOT DETECTED   Final   Influenza A NOT DETECTED   Final   Influenza B NOT DETECTED   Final   Parainfluenza 1 NOT DETECTED   Final   Parainfluenza 2 NOT DETECTED   Final   Parainfluenza 3 NOT DETECTED   Final   Metapneumovirus NOT DETECTED   Final   Rhinovirus NOT DETECTED   Final   Adenovirus NOT DETECTED   Final   Influenza A H1 NOT DETECTED   Final   Influenza A H3 NOT DETECTED   Final   Comment: (NOTE)           Normal Reference Range for each Analyte: NOT DETECTED     Testing performed using the Luminex xTAG Respiratory Viral Panel test     kit.     This test was developed and its performance characteristics determined     by [facility]. It has not been cleared or approved by the [place]     Food and Drug Administration. This test is used for clinical purposes.     It should not be regarded as investigational or for research. This     laboratory is certified under the Clinical Laboratory Improvement     Amendments of 1988 (CLIA) as qualified to perform high complexity     clinical laboratory testing.     Performed at [facility]  CULTURE, BLOOD (ROUTINE X 2)     Status: None   Collection Time    09/22/13  8:35 AM      Result Value Range Status   Specimen Description BLOOD LEFT ARM   Final   Special Requests BOTTLES DRAWN AEROBIC AND ANAEROBIC 10CC   Final   Culture  Setup Time     Final   Value: 09/22/2013 14:06     Performed at [facility]   Culture     Final   Value:        BLOOD CULTURE RECEIVED NO GROWTH TO DATE CULTURE WILL BE HELD FOR 5 DAYS BEFORE ISSUING A FINAL NEGATIVE REPORT     Performed at [facility]   Report Status PENDING   Incomplete  CULTURE, BLOOD (ROUTINE X 2)     Status: None   Collection Time    09/22/13  8:50 AM      Result Value Range Status   Specimen Description BLOOD LEFT HAND   Final   Special Requests BOTTLES DRAWN AEROBIC ONLY 8CC   Final    Culture  Setup Time     Final   Value: 09/22/2013 14:06     Performed at [facility]   Culture     Final   Value:        BLOOD CULTURE RECEIVED NO GROWTH TO DATE CULTURE WILL BE HELD FOR 5 DAYS BEFORE ISSUING A FINAL NEGATIVE REPORT     Performed at [facility]   Report Status PENDING   Incomplete    Studies/Results: Ct Chest W Contrast  09/24/2013   CLINICAL DATA:  Chest pain, nausea and vomiting  EXAM: CT CHEST, ABDOMEN, AND PELVIS WITH CONTRAST  TECHNIQUE: Multidetector CT imaging of the chest, abdomen and pelvis was performed following the standard protocol during bolus administration of intravenous contrast.  CONTRAST:  80mL OMNIPAQUE IOHEXOL 300 MG/ML  SOLN  COMPARISON:  Noncontrast CT 09/18/2013  FINDINGS: CT CHEST FINDINGS  There is centrilobular emphysema again demonstrated in the lungs. There are new bilateral small effusions. There is a small moderate focus of consolidation versus atelectasis at the right lung base. Similar finding at the left lung base but smaller.  No axillary supraclavicular lymphadenopathy. No mediastinal hilar lymphadenopathy. There is a lymph node along the right bronchus intermedius measuring 13 mm (image 31). No evidence of central pulmonary embolism. No evidence of aortic dissection. No pericardial fluid.  CT ABDOMEN AND PELVIS FINDINGS  No focal hepatic lesion. Patient status post cholecystectomy. The pancreas, spleen, and adrenal glands are normal. The kidneys demonstrate mild hypoperfusion however there is excretion of the IV contrast in the collecting systems on the delayed imaging. No evidence of renal obstruction. There are bilateral nonenhancing renal cysts. There is a large calculus in the mid left kidney measuring 12 mm.  The stomach, small bowel, appendix, and cecum normal. No evidence of appendicitis. The colon and rectosigmoid colon are normal. There are several diverticula of is sigmoid colon without acute inflammation. The sigmoid  colon is probably collapsed.  Abdominal aorta is normal caliber. There has been a open repair of the aorta these graft is patent. All  There is no free fluid the pelvis. Prostate gland and bladder normal. No pelvic lymphadenopathy. Review of bone windows demonstrates no aggressive osseous lesions.  IMPRESSION: 1. New mild bibasilar consolidations concerning for aspiration pneumonitis, pneumonia or atelectasis. 2. No evidence of central pulmonary embolism. 3. Post cholecystectomy. 4. Appendix is normal. 5. Mild hypoperfusion of the kidneys but normal excretion. 6. Abdominal aortic graft repair without complication.   Electronically Signed   By: Genevive Bi M.D.   On: 09/24/2013 19:51   Ct Abdomen Pelvis W Contrast  09/24/2013   CLINICAL DATA:  Chest pain, nausea and vomiting  EXAM: CT CHEST, ABDOMEN, AND PELVIS WITH CONTRAST  TECHNIQUE: Multidetector CT imaging of the chest, abdomen and pelvis was performed following the standard protocol during bolus administration of intravenous contrast.  CONTRAST:  80mL OMNIPAQUE IOHEXOL 300 MG/ML  SOLN  COMPARISON:  Noncontrast CT 09/18/2013  FINDINGS: CT CHEST FINDINGS  There is centrilobular emphysema again demonstrated in the lungs. There are new bilateral small effusions. There is a small moderate focus of consolidation versus atelectasis at the right lung base. Similar finding at the left lung base but smaller.  No axillary supraclavicular lymphadenopathy. No mediastinal hilar lymphadenopathy. There is a lymph node along the right bronchus intermedius measuring 13 mm (image 31). No evidence of central pulmonary embolism. No evidence of aortic dissection. No pericardial fluid.  CT ABDOMEN AND PELVIS FINDINGS  No focal hepatic lesion. Patient status post cholecystectomy. The pancreas, spleen, and adrenal glands are normal. The kidneys demonstrate mild hypoperfusion however there is excretion of the IV contrast in the collecting systems on the delayed imaging. No  evidence of renal obstruction. There are bilateral nonenhancing renal cysts. There is a large calculus in the mid left kidney measuring 12 mm.  The stomach, small bowel, appendix, and cecum normal. No evidence of appendicitis. The colon and rectosigmoid colon are normal. There are several diverticula of is sigmoid colon without acute inflammation. The sigmoid colon is probably collapsed.  Abdominal aorta is normal caliber. There has been a open repair of the aorta these graft is patent. All  There is no free fluid the pelvis. Prostate gland and bladder normal. No pelvic lymphadenopathy. Review of bone windows demonstrates no aggressive  osseous lesions.  IMPRESSION: 1. New mild bibasilar consolidations concerning for aspiration pneumonitis, pneumonia or atelectasis. 2. No evidence of central pulmonary embolism. 3. Post cholecystectomy. 4. Appendix is normal. 5. Mild hypoperfusion of the kidneys but normal excretion. 6. Abdominal aortic graft repair without complication.   Electronically Signed   By: Genevive Bi M.D.   On: 09/24/2013 19:51      Assessment/Plan: [name] is a 72 y.o. male with  72 yo with severe ischemic cardiomyopathy, CHF being evaluated for LVAD with admission for Vtach adn ICD firing again now with fevers of unkown origin   #1 FUO: His last CT read as showing possible aspiration pneumonia. IF this is truly the case this should be amply covered by his imipenem and vancomycin  --I would dc the vancomycin in the am since MRSA not a typical cause of aspiration pna --I would then IF he does well eventually exchange his imipenem and have him finish a total of 10 days uninterupted coverage between imipenem plus augmentin. I suspect he needed course of anerobic coverage that zosyn, imipenem were covering  --I will send off a few other FUO labs such as SPEP, Cryoglobulins with am labs and ANCA     LOS: 10 days   Acey Lav 09/25/2013, 12:43 PM

## 2013-09-25 NOTE — Evaluation (Signed)
Clinical/Bedside Swallow Evaluation Patient Details  Name: [name] MRN: [id number] Date of Birth: 01-16-1941  Today's Date: 09/25/2013 Time: 1000-1012 SLP Time Calculation (min): 12 min  Past Medical History:  Past Medical History  Diagnosis Date  . CAD (coronary artery disease)     s/p MI 1982;  s/p DES to CFX 2011;  s/p NSTEMI 4/12 in setting of VTach;  cath 7/12:  LM ok, LAD mid 30-40%, Dx's with 40%, mCFX stent patent, prox to mid RCA occluded with R to R and L to R collats.  Medical Tx was continued  . Bradycardia   . Ventricular tachycardia     s/p AICD;   h/o ICD shock;  Amiodarone Rx. S/P ablation in 2012  . 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 HTN (hypertension)   . HLD (hyperlipidemia)   . Cytopenia   . Hypothyroidism   . Renal insufficiency   . Claudication   . Chronic systolic heart failure   . Ischemic cardiomyopathy     echo 7/12:  EF 25-30%, mild AI, mod MR, mod LAE  . TIA (transient ischemic attack) 2001  . CVD (cerebrovascular disease)     left CEA 2008  . Stroke   . Anemia   . Inappropriate therapy from implantable cardioverter-defibrillator 04/02/2013    ATP for sinus tach  SVT wavelet identified SVT but was no  passive   . Myocardial infarction 1992  . Anginal pain   . Automatic implantable cardioverter-defibrillator in situ     Medtronic Evera device serial number [id number]    . Sleep apnea     denies wearing mask (05/25/2013)  . Arthritis     "a little in my knees" (05/25/2013)  . Melanoma of ear 2013    "near left ear" (05/25/2013)   Past Surgical History:  Past Surgical History  Procedure Laterality Date  . Femoral-popliteal bypass graft Right 2011  . Cardiac defibrillator placement  2003; 2005; 05/25/2013    2014: Medtronic Evera device serial number [id number]  . Revascularization / in-situ graft leg     . Renal artery bypass Bilateral 2003  . Back surgery    . Lumbar disc surgery  1974; 2000's  . Knee arthroscopy Bilateral 1990's  . Elbow arthroscopy Right 1990's  . Cholecystectomy  12/15/?2010  . Lipoma excision Left 2013    "near ear" (05/25/2013)  . Heel spur surgery Right 1990's  . Cataract extraction w/ intraocular lens  implant, bilateral Bilateral 2000  . Carotid endarterectomy Left ~ 2007  . Doppler echocardiography  2011  . Tonsillectomy  1946  . Femoral-popliteal bypass graft Left 05/2002    Hattie Perch 05/06/2002 (05/25/2013)  . Tumor excision Left 1960's    "fatty tumor" (05/25/2013)  . Cardiac electrophysiology study and ablation  2012  . Coronary angioplasty  1992  . Cardiac catheterization      "several" (05/25/2013)  . Coronary angioplasty with stent placement      "last one was 11/2012  (05/25/2013)   HPI:  72 yo M with CAD, previous ICD, adm on 11-11 with CP, underwent cardioversion. He developed fever in hospital on 11-15. He underwent CT chest/abd/pelvis- ? Appendix thickening. He was started on anbx on 11-15 and his quinidine was stopped. TEE (-). He became afebrile and his BCx were (-). Anbx  were stopped on 11-19 (after 5 days of vanco, 2 days of imipenem).  Recent episodes of N/V; now NPO.  demonstrated in the lungs. There are new bilateral small effusions. There is a small moderate focus of consolidation versus atelectasis at the right lung base. Similar finding at the left lung base but smaller.   Assessment / Plan / Recommendation Clinical Impression  Pt presents with normal oropharyngeal swallow function marked by active mastication, swift swallow trigger, and no s/s of aspiration.  Pt is alert, communicative; RR is within range of adequate coordination with swallow response.  There are no co-morbidities that raise concerns for swallowing deficits.  Doubt prandial aspiration as contributor to lung status.  Recommend resumption of prior diet/ no SLP f/u warranted.            Diet Recommendation Regular;Thin liquid   Liquid Administration via: Cup;Straw Medication Administration: Whole meds with liquid Supervision: Patient able to self feed    Other  Recommendations Oral Care Recommendations: Oral care BID   Follow Up Recommendations  None           SLP Swallow Goals  n/a   Swallow Study Prior Functional Status       General Date of Onset: 09/15/13 HPI: 72 yo M with CAD, previous ICD, adm on 11-11 with CP, underwent cardioversion. He developed fever in hospital on 11-15. He underwent CT chest/abd/pelvis- ? Appendix thickening. He was started on anbx on 11-15 and his quinidine was stopped. TEE (-). He became afebrile and his BCx were (-). Anbx were stopped on 11-19 (after 5 days of vanco, 2 days of imipenem).  Recent episodes of N/V; now NPO.  demonstrated in the lungs. There are new bilateral small effusions. There is a small moderate focus of consolidation versus atelectasis at the right lung base. Similar finding at the left lung base but smaller. Type of Study: Bedside swallow evaluation Previous Swallow Assessment: none per records Diet Prior to this Study: Regular;Thin liquids Temperature Spikes Noted: Yes Respiratory Status: Nasal cannula History of Recent Intubation: No Behavior/Cognition: Alert;Cooperative Oral Cavity - Dentition: Dentures, top;Dentures, bottom Self-Feeding Abilities: Able to feed self Patient Positioning: Upright in bed Baseline Vocal Quality: Clear Volitional Cough: Strong Volitional Swallow: Able to elicit    Oral/Motor/Sensory Function Overall Oral Motor/Sensory Function: Appears within functional limits for tasks assessed   Ice Chips Ice chips: Within functional limits Presentation: Spoon   Thin Liquid Thin Liquid: Within functional limits Presentation: Cup;Straw    Nectar Thick Nectar Thick Liquid: Not tested   Honey Thick Honey Thick Liquid: Not tested   Puree Puree: Within functional limits Presentation:  Self Fed   Solid  Caeleb Batalla L. [place], [place] CCC/SLP Pager [phone]     Solid: Within functional limits       Blenda Mounts [place]ice 09/25/2013,10:19 AM

## 2013-09-26 LAB — COMPREHENSIVE METABOLIC PANEL
Alkaline Phosphatase: 143 U/L — ABNORMAL HIGH (ref 39–117)
BUN: 18 mg/dL (ref 6–23)
Calcium: 8.4 mg/dL (ref 8.4–10.5)
GFR calc Af Amer: 59 mL/min — ABNORMAL LOW (ref >90)
GFR calc non Af Amer: 50 mL/min — ABNORMAL LOW (ref >90)
Glucose, Bld: 98 mg/dL (ref 70–99)
Total Protein: 5.5 g/dL — ABNORMAL LOW (ref 6.0–8.3)

## 2013-09-26 LAB — CBC WITH DIFFERENTIAL/PLATELET
HCT: 30.7 % — ABNORMAL LOW (ref 39.0–52.0)
Hemoglobin: 10.3 g/dL — ABNORMAL LOW (ref 13.0–17.0)
Lymphocytes Relative: 14 % (ref 12–46)
Lymphs Abs: 0.6 10*3/uL — ABNORMAL LOW (ref 0.7–4.0)
MCV: 97.2 fL (ref 78.0–100.0)
Monocytes Absolute: 0.8 10*3/uL (ref 0.1–1.0)
Monocytes Relative: 17 % — ABNORMAL HIGH (ref 3–12)
Neutro Abs: 3 10*3/uL (ref 1.7–7.7)
Neutrophils Relative %: 64 % (ref 43–77)
RBC: 3.16 MIL/uL — ABNORMAL LOW (ref 4.22–5.81)
RDW: 15.2 % (ref 11.5–15.5)

## 2013-09-26 NOTE — Consult Note (Signed)
I have reviewed and discussed the care of this patient in detail with the nurse practitioner including pertinent patient records, physical exam findings and data. I agree with details of this encounter.  

## 2013-09-26 NOTE — Progress Notes (Signed)
CARDIAC REHAB PHASE I   PRE:  Rate/Rhythm: 74 pacing    BP: sitting 106/82, standing 102/64    SaO2:   MODE:  Ambulation: 350 ft   POST:  Rate/Rhythm: 84 pacing    BP: sitting 103/60     SaO2:   Pt tolerated well. No c/o, no dizziness. BP stable. Joking today, steady with RW, assist x2 for safety. Will f/u as x1. [phone]   Elissa Lovett [address] CES, ACSM 09/26/2013 2:24 PM

## 2013-09-26 NOTE — Progress Notes (Signed)
SUBJECTIVE:  He denies any pain or SOB.   PHYSICAL EXAM Filed Vitals:   09/26/13 0007 09/26/13 0400 09/26/13 0428 09/26/13 0748  BP:  95/56    Pulse:      Temp: 98.4 F (36.9 C)  98.2 F (36.8 C)   TempSrc: Oral  Oral   Resp:  13    Height:      Weight:    192 lb 7.4 oz (87.3 kg)  SpO2: 94% 95%     General:  No distress Lungs:  Clear Heart:  RRR Abdomen:  Positive bowel sounds, no rebound no guarding Extremities:  No edema  LABS: Lab Results  Component Value Date   TROPONINI <0.30 09/24/2013   Results for orders placed during the hospital encounter of 09/15/13 (from the past 24 hour(s))  CBC WITH DIFFERENTIAL     Status: Abnormal   Collection Time    09/26/13  6:05 AM      Result Value Range   WBC 4.7  4.0 - 10.5 K/uL   RBC 3.16 (*) 4.22 - 5.81 MIL/uL   Hemoglobin 10.3 (*) 13.0 - 17.0 g/dL   HCT 11.9 (*) 14.7 - 82.9 %   MCV 97.2  78.0 - 100.0 fL   MCH 32.6  26.0 - 34.0 pg   MCHC 33.6  30.0 - 36.0 g/dL   RDW 56.2  13.0 - 86.5 %   Platelets 152  150 - 400 K/uL   Neutrophils Relative % 64  43 - 77 %   Neutro Abs 3.0  1.7 - 7.7 K/uL   Lymphocytes Relative 14  12 - 46 %   Lymphs Abs 0.6 (*) 0.7 - 4.0 K/uL   Monocytes Relative 17 (*) 3 - 12 %   Monocytes Absolute 0.8  0.1 - 1.0 K/uL   Eosinophils Relative 5  0 - 5 %   Eosinophils Absolute 0.2  0.0 - 0.7 K/uL   Basophils Relative 1  0 - 1 %   Basophils Absolute 0.0  0.0 - 0.1 K/uL  COMPREHENSIVE METABOLIC PANEL     Status: Abnormal   Collection Time    09/26/13  6:05 AM      Result Value Range   Sodium 134 (*) 135 - 145 mEq/L   Potassium 4.3  3.5 - 5.1 mEq/L   Chloride 101  96 - 112 mEq/L   CO2 24  19 - 32 mEq/L   Glucose, Bld 98  70 - 99 mg/dL   BUN 18  6 - 23 mg/dL   Creatinine, Ser 7.84  0.50 - 1.35 mg/dL   Calcium 8.4  8.4 - 69.6 mg/dL   Total Protein 5.5 (*) 6.0 - 8.3 g/dL   Albumin 2.3 (*) 3.5 - 5.2 g/dL   AST 55 (*) 0 - 37 U/L   ALT 100 (*) 0 - 53 U/L   Alkaline Phosphatase 143 (*) 39 - 117 U/L     Total Bilirubin 0.5  0.3 - 1.2 mg/dL   GFR calc non Af [name] 50 (*) >90 mL/min   GFR calc Af [name] 59 (*) >90 mL/min    Intake/Output Summary (Last 24 hours) at 09/26/13 0811 Last data filed at 09/26/13 0500  Gross per 24 hour  Intake   1273 ml  Output   1450 ml  Net   -177 ml    ASSESSMENT AND PLAN:  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  Continue amiodarone  CHRONIC SYSTOLIC HEART FAILURE:  Eventual consideration of LVAD.  Ambulated yesterday.  Unable to titrate meds further with hypotension.   CAD: No active ischemia. Continue current therapy.  FEVER:   Day six of antibiotics.  ID suggested that we discontinue the Vanc this AM.  Other suggestions per ID.  Question aspiration pneumonia.  Note swallow evaluation is complete.     Fayrene Fearing [facility] 09/26/2013 8:11 AM

## 2013-09-27 LAB — COMPREHENSIVE METABOLIC PANEL
ALT: 140 U/L — ABNORMAL HIGH (ref 0–53)
AST: 102 U/L — ABNORMAL HIGH (ref 0–37)
Albumin: 2.5 g/dL — ABNORMAL LOW (ref 3.5–5.2)
Alkaline Phosphatase: 162 U/L — ABNORMAL HIGH (ref 39–117)
CO2: 27 mEq/L (ref 19–32)
Chloride: 103 mEq/L (ref 96–112)
Creatinine, Ser: 1.43 mg/dL — ABNORMAL HIGH (ref 0.50–1.35)
GFR calc Af Amer: 55 mL/min — ABNORMAL LOW (ref >90)
GFR calc non Af Amer: 47 mL/min — ABNORMAL LOW (ref >90)
Glucose, Bld: 98 mg/dL (ref 70–99)
Potassium: 4.5 mEq/L (ref 3.5–5.1)
Sodium: 136 mEq/L (ref 135–145)
Total Bilirubin: 0.4 mg/dL (ref 0.3–1.2)

## 2013-09-27 LAB — CBC WITH DIFFERENTIAL/PLATELET
Basophils Absolute: 0 10*3/uL (ref 0.0–0.1)
Basophils Relative: 1 % (ref 0–1)
HCT: 32.2 % — ABNORMAL LOW (ref 39.0–52.0)
Lymphocytes Relative: 15 % (ref 12–46)
Lymphs Abs: 0.7 10*3/uL (ref 0.7–4.0)
Monocytes Absolute: 0.7 10*3/uL (ref 0.1–1.0)
Neutro Abs: 3.1 10*3/uL (ref 1.7–7.7)
Neutrophils Relative %: 65 % (ref 43–77)
RDW: 15.4 % (ref 11.5–15.5)
WBC: 4.7 10*3/uL (ref 4.0–10.5)

## 2013-09-27 NOTE — Progress Notes (Signed)
SUBJECTIVE:  He denies any pain or SOB.  Complained of dizziness.  Afebrile.   PHYSICAL EXAM Filed Vitals:   09/27/13 0016 09/27/13 0020 09/27/13 0425 09/27/13 0445  BP:  97/41 104/78   Pulse:      Temp: 98.7 F (37.1 C)   98.4 F (36.9 C)  TempSrc: Oral   Oral  Resp:  12 18   Height:      Weight:      SpO2:       General:  No distress Lungs:  Clear Heart:  RRR Abdomen:  Positive bowel sounds, no rebound no guarding Extremities:  No edema  LABS: Lab Results  Component Value Date   TROPONINI <0.30 09/24/2013   Results for orders placed during the hospital encounter of 09/15/13 (from the past 24 hour(s))  CBC WITH DIFFERENTIAL     Status: Abnormal   Collection Time    09/27/13  5:24 AM      Result Value Range   WBC 4.7  4.0 - 10.5 K/uL   RBC 3.33 (*) 4.22 - 5.81 MIL/uL   Hemoglobin 10.9 (*) 13.0 - 17.0 g/dL   HCT 16.1 (*) 09.6 - 04.5 %   MCV 96.7  78.0 - 100.0 fL   MCH 32.7  26.0 - 34.0 pg   MCHC 33.9  30.0 - 36.0 g/dL   RDW 40.9  81.1 - 91.4 %   Platelets 219  150 - 400 K/uL   Neutrophils Relative % 65  43 - 77 %   Neutro Abs 3.1  1.7 - 7.7 K/uL   Lymphocytes Relative 15  12 - 46 %   Lymphs Abs 0.7  0.7 - 4.0 K/uL   Monocytes Relative 14 (*) 3 - 12 %   Monocytes Absolute 0.7  0.1 - 1.0 K/uL   Eosinophils Relative 5  0 - 5 %   Eosinophils Absolute 0.2  0.0 - 0.7 K/uL   Basophils Relative 1  0 - 1 %   Basophils Absolute 0.0  0.0 - 0.1 K/uL  COMPREHENSIVE METABOLIC PANEL     Status: Abnormal   Collection Time    09/27/13  5:24 AM      Result Value Range   Sodium 136  135 - 145 mEq/L   Potassium 4.5  3.5 - 5.1 mEq/L   Chloride 103  96 - 112 mEq/L   CO2 27  19 - 32 mEq/L   Glucose, Bld 98  70 - 99 mg/dL   BUN 21  6 - 23 mg/dL   Creatinine, Ser 7.82 (*) 0.50 - 1.35 mg/dL   Calcium 8.7  8.4 - 95.6 mg/dL   Total Protein 5.9 (*) 6.0 - 8.3 g/dL   Albumin 2.5 (*) 3.5 - 5.2 g/dL   AST 213 (*) 0 - 37 U/L   ALT 140 (*) 0 - 53 U/L   Alkaline Phosphatase 162  (*) 39 - 117 U/L   Total Bilirubin 0.4  0.3 - 1.2 mg/dL   GFR calc non Af [name] 47 (*) >90 mL/min   GFR calc Af [name] 55 (*) >90 mL/min    Intake/Output Summary (Last 24 hours) at 09/27/13 0755 Last data filed at 09/27/13 0500  Gross per 24 hour  Intake    680 ml  Output   1475 ml  Net   -795 ml    ASSESSMENT AND PLAN:  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  Continue amiodarone  CHRONIC SYSTOLIC HEART FAILURE:  Eventual consideration of LVAD.  Ambulated 350 feet yesterday.  Unable to titrate meds further with hypotension.   Note AST and ALT are up slightly.   CAD: No active ischemia. Continue current therapy.  FEVER:   Day seven of antibiotics. Followed by ID.  Question aspiration pneumonia.  Note swallow evaluation is complete.     Rollene Rotunda 09/27/2013 7:55 AM

## 2013-09-27 NOTE — Progress Notes (Signed)
[facility]    Day # 4  imipenem (2nd course)  Got 2 days of vancomycin on 2nd course   Prior to that he got 4 days of vancomycin  2 days of ceftriaxone 1 day of zosyn  And 2 days of imipenem  Other than one day without antibiotics he has been on broad spectrum antibiotics for 5 days   Subjective: " I feel great"   Antibiotics:  Anti-infectives   Start     Dose/Rate Route Frequency Ordered Stop   09/24/13 1200  vancomycin (VANCOCIN) IVPB 1000 mg/200 mL premix  Status:  Discontinued     1,000 mg 200 mL/hr over 60 Minutes Intravenous Every 12 hours 09/24/13 1112 09/26/13 0808   09/24/13 1200  meropenem (MERREM) 1 g in sodium chloride 0.9 % 100 mL IVPB     1 g 200 mL/hr over 30 Minutes Intravenous 3 times per day 09/24/13 1112     09/22/13 2000  vancomycin (VANCOCIN) IVPB 1000 mg/200 mL premix  Status:  Discontinued     1,000 mg 200 mL/hr over 60 Minutes Intravenous Every 12 hours 09/22/13 1310 09/23/13 0921   09/22/13 1000  imipenem-cilastatin (PRIMAXIN) 500 mg in sodium chloride 0.9 % 100 mL IVPB  Status:  Discontinued     500 mg 200 mL/hr over 30 Minutes Intravenous 3 times per day 09/22/13 0917 09/23/13 0921   09/21/13 1700  piperacillin-tazobactam (ZOSYN) IVPB 3.375 g  Status:  Discontinued     3.375 g 12.5 mL/hr over 240 Minutes Intravenous Every 8 hours 09/21/13 0824 09/22/13 0831   09/21/13 0930  piperacillin-tazobactam (ZOSYN) IVPB 3.375 g     3.375 g 100 mL/hr over 30 Minutes Intravenous  Once 09/21/13 0824 09/21/13 0954   09/19/13 2100  cefTRIAXone (ROCEPHIN) 1 g in dextrose 5 % 50 mL IVPB  Status:  Discontinued     1 g 100 mL/hr over 30 Minutes Intravenous Every 24 hours 09/19/13 2029 09/21/13 0806   09/19/13 2100  vancomycin (VANCOCIN) IVPB 750 mg/150 ml premix  Status:  Discontinued     750 mg 150 mL/hr over 60 Minutes Intravenous Every 12 hours 09/19/13 2029 09/22/13 1310      Medications: Scheduled Meds: . amiodarone   400 mg Oral Daily  . antiseptic oral rinse  15 mL Mouth Rinse BID  . aspirin EC  81 mg Oral Daily  . feeding supplement (RESOURCE BREEZE)  1 Container Oral BID BM  . fentaNYL  25 mcg Intravenous Once  . heparin  5,000 Units Subcutaneous Q8H  . levothyroxine  112 mcg Oral QAC breakfast  . meropenem (MERREM) IV  1 g Intravenous Q8H  . midazolam  2 mg Intravenous Once  . sodium chloride  3 mL Intravenous Q12H   Continuous Infusions:   PRN Meds:.acetaminophen, fentaNYL, nitroGLYCERIN, ondansetron (ZOFRAN) IV, sodium chloride   Objective: Weight change:   Intake/Output Summary (Last 24 hours) at 09/27/13 1759 Last data filed at 09/27/13 1100  Gross per 24 hour  Intake    620 ml  Output   1625 ml  Net  -1005 ml   Blood pressure 106/56, pulse 69, temperature 97.2 F (36.2 C), temperature source Oral, resp. rate 18, height 6' (1.829 m), weight 191 lb (86.637 kg), SpO2 100.00%. Temp:  [97.2 F (36.2 C)-98.7 F (37.1 C)] 97.2 F (36.2 C) (11/23 1257) Pulse Rate:  [69-78] 69 (11/23 1257) Resp:  [12-18] 18 (11/23 1257) BP: (97-129)/(41-78) 106/56 mmHg (11/23 1257) SpO2:  [  98 %-100 %] 100 % (11/23 1257) Weight:  [191 lb (86.637 kg)] 191 lb (86.637 kg) (11/23 1105)  Physical Exam: General: Alert and awake, oriented x3, not in any acute distress.  HEENT: anicteric sclera,  meningismum , EOMI, oropharynx clear and without exudate  CVS regular rate, normal r, no murmur rubs or gallops  Chest:dec breath sounds at the bases Abdomen: soft nontender, nondistended, normal bowel sounds,  Extremities: no clubbing or edema noted bilaterally  Skin: no rashes, ICD site is clean  Neuro: nonfocal, strength and sensation intact   Lab Results:  Recent Labs  09/26/13 0605 09/27/13 0524  WBC 4.7 4.7  HGB 10.3* 10.9*  HCT 30.7* 32.2*  PLT 152 219    BMET  Recent Labs  09/26/13 0605 09/27/13 0524  NA 134* 136  K 4.3 4.5  CL 101 103  CO2 24 27  GLUCOSE 98 98  BUN 18 21    CREATININE 1.35 1.43*  CALCIUM 8.4 8.7    Micro Results: Recent Results (from the past 240 hour(s))  CULTURE, BLOOD (ROUTINE X 2)     Status: None   Collection Time    09/19/13  9:25 PM      Result Value Range Status   Specimen Description BLOOD RIGHT HAND   Final   Special Requests BOTTLES DRAWN AEROBIC ONLY 1CC   Final   Culture  Setup Time     Final   Value: 09/20/2013 00:19     Performed at [facility]   Culture     Final   Value:        BLOOD CULTURE RECEIVED NO GROWTH TO DATE CULTURE WILL BE HELD FOR 5 DAYS BEFORE ISSUING A FINAL NEGATIVE REPORT     Performed at [facility]   Report Status PENDING   Incomplete  CULTURE, BLOOD (ROUTINE X 2)     Status: None   Collection Time    09/19/13  9:30 PM      Result Value Range Status   Specimen Description BLOOD LEFT HAND   Final   Special Requests BOTTLES DRAWN AEROBIC ONLY 1CC   Final   Culture  Setup Time     Final   Value: 09/20/2013 00:19     Performed at [facility]   Culture     Final   Value:        BLOOD CULTURE RECEIVED NO GROWTH TO DATE CULTURE WILL BE HELD FOR 5 DAYS BEFORE ISSUING A FINAL NEGATIVE REPORT     Performed at [facility]   Report Status PENDING   Incomplete  URINE CULTURE     Status: None   Collection Time    09/19/13 10:13 PM      Result Value Range Status   Specimen Description URINE, CLEAN CATCH   Final   Special Requests Normal   Final   Culture  Setup Time     Final   Value: 09/20/2013 00:22     Performed at [facility]   Culture     Final   Value: NO GROWTH     Performed at [facility]   Report Status 09/20/2013 FINAL   Final  RESPIRATORY VIRUS PANEL     Status: None   Collection Time    09/20/13  5:49 AM      Result Value Range Status   Source - [facility]AN NASAL SWAB   Corrected   Comment: CORRECTED ON 11/16 AT 2254: PREVIOUSLY REPORTED  AS NASAL SWAB   Respiratory Syncytial Virus A NOT DETECTED   Final   Respiratory Syncytial Virus B  NOT DETECTED   Final   Influenza A NOT DETECTED   Final   Influenza B NOT DETECTED   Final   Parainfluenza 1 NOT DETECTED   Final   Parainfluenza 2 NOT DETECTED   Final   Parainfluenza 3 NOT DETECTED   Final   Metapneumovirus NOT DETECTED   Final   Rhinovirus NOT DETECTED   Final   Adenovirus NOT DETECTED   Final   Influenza A H1 NOT DETECTED   Final   Influenza A H3 NOT DETECTED   Final   Comment: (NOTE)           Normal Reference Range for each Analyte: NOT DETECTED     Testing performed using the Luminex xTAG Respiratory Viral Panel test     kit.     This test was developed and its performance characteristics determined     by [facility]. It has not been cleared or approved by the [place]     Food and Drug Administration. This test is used for clinical purposes.     It should not be regarded as investigational or for research. This     laboratory is certified under the Clinical Laboratory Improvement     Amendments of 1988 (CLIA) as qualified to perform high complexity     clinical laboratory testing.     Performed at [facility]  CULTURE, BLOOD (ROUTINE X 2)     Status: None   Collection Time    09/22/13  8:35 AM      Result Value Range Status   Specimen Description BLOOD LEFT ARM   Final   Special Requests BOTTLES DRAWN AEROBIC AND ANAEROBIC 10CC   Final   Culture  Setup Time     Final   Value: 09/22/2013 14:06     Performed at [facility]   Culture     Final   Value:        BLOOD CULTURE RECEIVED NO GROWTH TO DATE CULTURE WILL BE HELD FOR 5 DAYS BEFORE ISSUING A FINAL NEGATIVE REPORT     Performed at [facility]   Report Status PENDING   Incomplete  CULTURE, BLOOD (ROUTINE X 2)     Status: None   Collection Time    09/22/13  8:50 AM      Result Value Range Status   Specimen Description BLOOD LEFT HAND   Final   Special Requests BOTTLES DRAWN AEROBIC ONLY 8CC   Final   Culture  Setup Time     Final   Value: 09/22/2013 14:06      Performed at [facility]   Culture     Final   Value:        BLOOD CULTURE RECEIVED NO GROWTH TO DATE CULTURE WILL BE HELD FOR 5 DAYS BEFORE ISSUING A FINAL NEGATIVE REPORT     Performed at [facility]   Report Status PENDING   Incomplete    Studies/Results: No results found.    Assessment/Plan: [name] is a 72 y.o. male with  72 yo with severe ischemic cardiomyopathy, CHF being evaluated for LVAD with admission for Vtach adn ICD firing again admitted with with fevers of unkown origin finally felt to have pneumonia possibly due to aspiration  #1 FUO which we have ascribed to possible aspiration PNA  --He has done  fine narrowed off of vanco ot merrem alone --While he is in house would continue the Merrem and as he nears DC would change to augmentin to complete 10 day course of  uninterupted coverage between merrem --> augmentin. I suspect he needed course of anerobic coverage that zosyn, imipenem were covering  --I will follow up with  other FUO labs such as SPEP, Cryoglobulins with am labs and ANCA  I will sign off for now but would be happy to see him in our clinic for followup as an outpatient in December     LOS: 12 days   Acey Lav 09/27/2013, 5:59 PM

## 2013-09-27 NOTE — Progress Notes (Signed)
Pt transferred to 4E from 2900, pt a/o, no c/o pain, pt stable, will continue to monitor

## 2013-09-27 NOTE — Progress Notes (Signed)
ANTIBIOTIC CONSULT NOTE - FOLLOW UP  Pharmacy Consult for Vancomycin(dc'd) and Meropenem Indication: FUO  Allergies  Allergen Reactions  . Meperidine Hcl Other (See Comments)    "CAN'T MOVE" CATATONIC PER PT.  [name] Opium Other (See Comments)    "CAN'T MOVE" CATATONIC PER PT.   Patient Measurements: Height: 6' (182.9 cm) Weight: 192 lb 7.4 oz (87.3 kg) IBW/kg (Calculated) : 77.6  Vital Signs: Temp: 97.8 F (36.6 C) (11/23 0815) Temp src: Oral (11/23 0815) BP: 129/77 mmHg (11/23 0815) Intake/Output from previous day: 11/22 0701 - 11/23 0700 In: 680 [P.O.:380; IV Piggyback:300] Out: 1475 [Urine:1475] Intake/Output from this shift: Total I/O In: 180 [P.O.:180] Out: 750 [Urine:750]  Labs:  Recent Labs  09/25/13 0441 09/26/13 0605 09/27/13 0524  WBC 5.7 4.7 4.7  HGB 10.5* 10.3* 10.9*  PLT 124* 152 219  CREATININE 1.37* 1.35 1.43*   Estimated Creatinine Clearance: 50.5 ml/min (by C-G formula based on Cr of 1.43).  Assessment: 71yom with ICM s/p ICD implant admitted 11/11 with Vtach/CP developed a fever in hospital on 11/15. He was started on antibiotics with concern for possible aspiration pneumonia, sepsis/bacteremia/endocarditis. TEE was negative for vegetations/endocarditis and all cultures have been negative thus far. ID stopped his Vancomycin but continued meropenem to complete 10 day course of therapy.  Zosyn 11/17>11/17 Primaxin 11/17>11/19 Vancomycin 11/15>11/19> resume 11/20>11/22 Rocephin 11/15>11/17  Meropenem 11/20>  11/5 blood x2 - ngtd 11/15 urine - negative 11/18 blood x2 - ngtd  Goal of Therapy:  Therapeutic response to IV Meropenem  Plan:  1) Continue Meropenem 1g IV q8 2) Follow up fever curve, cultures, renal function  Nadara Mustard, PharmD., MS Clinical Pharmacist Pager:  [phone] Thank you for allowing pharmacy to be part of this patients care team. 09/27/2013,10:31 AM

## 2013-09-28 DIAGNOSIS — J690 Pneumonitis due to inhalation of food and vomit: Secondary | ICD-10-CM

## 2013-09-28 LAB — COMPREHENSIVE METABOLIC PANEL
ALT: 137 U/L — ABNORMAL HIGH (ref 0–53)
Albumin: 2.8 g/dL — ABNORMAL LOW (ref 3.5–5.2)
Alkaline Phosphatase: 160 U/L — ABNORMAL HIGH (ref 39–117)
BUN: 20 mg/dL (ref 6–23)
CO2: 23 mEq/L (ref 19–32)
Calcium: 8.6 mg/dL (ref 8.4–10.5)
GFR calc Af Amer: 65 mL/min — ABNORMAL LOW (ref >90)
Glucose, Bld: 88 mg/dL (ref 70–99)
Potassium: 4.5 mEq/L (ref 3.5–5.1)
Sodium: 133 mEq/L — ABNORMAL LOW (ref 135–145)
Total Protein: 6 g/dL (ref 6.0–8.3)

## 2013-09-28 LAB — CBC WITH DIFFERENTIAL/PLATELET
Basophils Relative: 1 % (ref 0–1)
Eosinophils Absolute: 0.2 10*3/uL (ref 0.0–0.7)
Eosinophils Relative: 4 % (ref 0–5)
Lymphocytes Relative: 16 % (ref 12–46)
MCH: 32.7 pg (ref 26.0–34.0)
MCHC: 34 g/dL (ref 30.0–36.0)
MCV: 96 fL (ref 78.0–100.0)
Neutrophils Relative %: 63 % (ref 43–77)
Platelets: 263 10*3/uL (ref 150–400)
RBC: 3.49 MIL/uL — ABNORMAL LOW (ref 4.22–5.81)

## 2013-09-28 LAB — CULTURE, BLOOD (ROUTINE X 2): Culture: NO GROWTH

## 2013-09-28 MED ORDER — CARVEDILOL 3.125 MG PO TABS
3.1250 mg | ORAL_TABLET | Freq: Two times a day (BID) | ORAL | Status: DC
  Administered 2013-09-28 – 2013-09-29 (×3): 3.125 mg via ORAL
  Filled 2013-09-28 (×5): qty 1

## 2013-09-28 NOTE — Progress Notes (Signed)
Physical Therapy Evaluation and D/C Patient Details Name: SYLIS KETCHUM MRN: [id number] DOB: [birth year] Today's Date: 09/28/2013 Time: 3086-5784 PT Time Calculation (min): 14 min  PT Assessment / Plan / Recommendation History of Present Illness  Pt admit for heart failure.  Plan is to have work up for LVAD.    Clinical Impression  Pt admitted with above. Pt currently without functional limitations.  Does not need skilled PT at this time.  Will sign off.      PT Assessment  Patent does not need any further PT services    Follow Up Recommendations  No PT follow up                Equipment Recommendations  None recommended by PT               Precautions / Restrictions Precautions Precautions: None Restrictions Weight Bearing Restrictions: No   Pertinent Vitals/Pain VSS, no pain      Mobility  Bed Mobility Bed Mobility: Not assessed Transfers Transfers: Sit to Stand;Stand to Sit Sit to Stand: 5: Supervision Stand to Sit: 5: Supervision Ambulation/Gait Ambulation/Gait Assistance: 5: Supervision Ambulation Distance (Feet): 450 Feet Assistive device: Rolling walker Ambulation/Gait Assistance Details: No LOB with min challenges.  Gait Pattern: Within Functional Limits Gait velocity: community ambulator Stairs: No Wheelchair Mobility Wheelchair Mobility: No      PT Goals(Current goals can be found in the care plan section) Acute Rehab PT Goals Patient Stated Goal: to go home  Visit Information  Last PT Received On: 09/28/13 Assistance Needed: +1 History of Present Illness: Pt admit for heart failure.  Plan is to have work up for LVAD.         Prior Functioning  Home Living Family/patient expects to be discharged to:: Private residence Living Arrangements: Spouse/significant other Available Help at Discharge: Family;Friend(s);Available 24 hours/day Type of Home:  (Condo) Home Access: Level entry Home Layout: Two level Alternate Level  Stairs-Number of Steps: doesnt use second level Home Equipment: Cane - single point;Walker - 2 wheels;Shower seat;Hand held shower head (walk in shower) Prior Function Level of Independence: Independent Communication Communication: No difficulties    Cognition  Cognition Arousal/Alertness: Awake/alert Behavior During Therapy: WFL for tasks assessed/performed Overall Cognitive Status: Within Functional Limits for tasks assessed    Extremity/Trunk Assessment Upper Extremity Assessment Upper Extremity Assessment: Defer to OT evaluation Lower Extremity Assessment Lower Extremity Assessment: Generalized weakness Cervical / Trunk Assessment Cervical / Trunk Assessment: Normal   Balance Dynamic Standing Balance Dynamic Standing - Balance Support: Bilateral upper extremity supported;During functional activity Dynamic Standing - Level of Assistance: 7: Independent Dynamic Standing - Balance Activities: Forward lean/weight shifting;Lateral lean/weight shifting;Reaching across midline  End of Session PT - End of Session Equipment Utilized During Treatment: Gait belt Activity Tolerance: Patient tolerated treatment well Patient left: in chair;with call bell/phone within reach Nurse Communication: Mobility status       [name],Brevyn Ring 09/28/2013, 4:34 PM  [facility] [phone] (pager)

## 2013-09-28 NOTE — Care Management Note (Addendum)
  Page 1 of 1   09/28/2013     11:02:28 AM   CARE MANAGEMENT NOTE 09/28/2013  Patient:  [name], [name]   Account Number:  [id number]  Date Initiated:  09/21/2013  Documentation initiated by:  Junius Creamer  Subjective/Objective Assessment:   adm w heart failure     Action/Plan:   lives w fam, pcp dr [name]   Anticipated DC Date:  09/30/2013   Anticipated DC Plan:  HOME W HOME HEALTH SERVICES      DC Planning Services  CM consult      Choice offered to / List presented to:          [facility] arranged  HH-1 RN  HH-10 DISEASE MANAGEMENT      HH agency  [facility]   Status of service:  Completed, signed off Medicare Important Message given?   (If response is "NO", the following Medicare IM given date fields will be blank) Date Medicare IM given:   Date Additional Medicare IM given:    Discharge Disposition:    Per UR Regulation:  Reviewed for med. necessity/level of care/duration of stay  If discussed at Long Length of Stay Meetings, dates discussed:   09/22/2013  09/24/2013    Comments:  09/28/13 1015 Oletta Cohn, RN, BSN, [employer] [phone] Spoke with pt. regarding discharge planning. CM offered pt. list of home health agencies. Pt. chose [facility] to render services. Corrie Dandy of [place] notified. No DME needs identified at this time.

## 2013-09-28 NOTE — Progress Notes (Signed)
CARDIAC REHAB PHASE I   PRE:  Rate/Rhythm: 71 paced    BP: sitting 106/60    SaO2: 94 RA  MODE:  Ambulation: 460 ft   POST:  Rate/Rhythm: 85     BP: sitting 110/60     SaO2: 98 RA  Pt alert, motivated today. Feels good. Steady with RW. Denied dizziness. Good endurance, BP stable. Return to EOB. Will f/u. 1191-4782   [name] CES, ACSM 09/28/2013 8:42 AM

## 2013-09-28 NOTE — Progress Notes (Signed)
Patient was scheduled for SW LVAD assessment today but this had to be cancelled due to patient's medical condition worsening.  CSW will reschedule when stable per MD.  Lupita Leash T. [place]  [phone]

## 2013-09-28 NOTE — Progress Notes (Signed)
Patient was transferred to 4E- room 6. CSW met with patient- have scheduled CSW LVAD  Assessment tomorrow morning at 9:00. Delories Heinz, LCSW-  [facility] will provide lead for this assessment; patient's significant other will be unable to attend the assessment nor will his daughter.  Patient continues to maintain that he plans to "hire" private caregivers to be with him while his girlfriend and daughter works as they will not be able to take time off from work.  CSW's will need to speak directly with girlfriend and daughter to determine actual intentions.  Lorri Frederick. [place]  [phone]

## 2013-09-28 NOTE — Discharge Summary (Signed)
Advanced Heart Failure Team  Discharge Summary   Patient ID: [name] MRN: [id number], DOB/AGE: Mar 15, 1941 72 y.o. Admit date: 09/15/2013 D/C date:     09/29/2013   Primary Discharge Diagnoses:  1) Recurrent V-tach, s/p ICD shock --ablation 09/2011 and 06/2013 --quinidine started but alter stopped 2) Chronic systolic HF  --EF 20-25% 09/2013 3) Fever likely due to aspiration PNA  --swallow study normal  Secondary Discharge Diagnoses:   1) Mixed NICM/ICM 2) CAD -- chronically occluded RCA with L>R collaterals per cath 11/2012 3) PAD, severe 4) HTN 5) LBBB 6) Sepsis 7) Hyponatremia 8) Renal cyst    Hospital Course: Mr. Espinola is a pleasant 72 yo male who has been followed closely by Dr. Graciela Husbands for recurrent ICD shocks. His history is listed above. He has been admitted to the hospital multiple times for ICD shock and presented to the ED 09/15/13 with CP and was found to be in [place]. He was sedated and cardioverted, which was successful and then admitted to the ICU.  With his NYHA IV symptoms and frequent Vtach he began the work-up process for advacned therapies. Unfortunatley d/t his age and severe PAD he is not a transplant candiate and a lengthy discussion was held with patient and family about LVAD therapy. The role of VAD therapy in the setting of advanced HF and the fact that it may not decrease his VT burden but would likely render it more hemodynamically stable was discussed and the patient decided he wanted to proceed with the work-up. His CPX test showed severe circulatory limitation with a VO2 of 10.9 and VE/VCO2 slope of 48.   RHC 09/17/13 RA = 4  RV = 42/4/5  PA = 44/22 (31)  PCW = 19 v = 24  Fick cardiac output/index = 5.45/2.6  Thermo CO/CI = 3.8/1.8  PVR = 2.2 WU (Fick)  FA sat = 92%  PA sat = 60%, 60%  SVC = 61%   The patient proceeded with the pre LVAD testing and education. While his stay he started peaking fevers to 103 and had pan cultures and  prophylactic antibiotics were started (vacncomycin and ceftriaxone). All cultures were negative, however he still had fevers, chills and rigors. ID was consulted for workup and management of fever of unknown origin and at that time he was on vancomycin and imipenem. He had a chest CT abdomen/pelvis which was unremarkable and TEE showed no evidence of vegetation on heart valves. The thought was that he could have quinidine induced fever and ID recommended discontinuing quinidine and stopping antibiotics on 09/23/13. However on 11/20 his vancomycin and meropenem were restarted for repeated fevers. Repeat CT showed possible aspiration pneumonia and Vancomycin was stopped on 09/26/13 and meropenem continued, will change to Augmentin at d/c for total of 10 days of of uninterrupted therapy 10/02/13.  He completed the complete LVAD workup during his stay including palliative care consult and social work consult. On discharge he was doing well and had been afebrile for 5 days. WBC today 4.8. He worked with PT and was walking 450 ft with no SOB or dizziness. A low dose BB coreg 3.125 mg BID was restarted, which he tolerated. He is not on an ACE-I or spiro d/t hypotension. He will be followed closely in the HF clinic and will be discharged home today with [facility] HHRN/PT. He will need to be presented formally at the [facility] meeting next week and then [facility] for consideration of LVAD. He will also need to remain afebrile  for 2 weeks after stopping antibiotics.   Discharge Weight Range: 189-192 lbs. Discharge Vitals: Blood pressure 105/62, pulse 70, temperature 98.1 F (36.7 C), temperature source Oral, resp. rate 17, height 6' (1.829 m), weight 189 lb 6 oz (85.9 kg), SpO2 99.00%.  Labs: Lab Results  Component Value Date   WBC 4.8 09/29/2013   HGB 10.9* 09/29/2013   HCT 33.2* 09/29/2013   MCV 98.8 09/29/2013   PLT 269 09/29/2013     Recent Labs Lab 09/29/13 0555  NA 138  K 5.0  CL 102  CO2 27  BUN 19   CREATININE 1.46*  CALCIUM 8.6  PROT 5.8*  BILITOT 0.4  ALKPHOS 145*  ALT 105*  AST 51*  GLUCOSE 84   Lab Results  Component Value Date   CHOL 118 11/28/2012   HDL 50.80 11/28/2012   LDLCALC 58 11/28/2012   TRIG 48.0 11/28/2012   BNP (last 3 results)  Recent Labs  11/23/12 0901 09/15/13 0424 09/15/13 0750  PROBNP 1891.0* 2704.0* 3038.0*    Diagnostic Studies/Procedures   No results found.  Discharge Medications     Medication List    STOP taking these medications       clopidogrel 75 MG tablet  Commonly known as:  PLAVIX     quiniDINE gluconate 324 MG CR tablet     ramipril 2.5 MG capsule  Commonly known as:  ALTACE      TAKE these medications       amiodarone 200 MG tablet  Commonly known as:  PACERONE  Take 2 tablets (400 mg total) by mouth daily.     amoxicillin-clavulanate 875-125 MG per tablet  Commonly known as:  AUGMENTIN  Take 1 tablet by mouth every 12 (twelve) hours.     aspirin 81 MG tablet  Take 81 mg by mouth daily.     atorvastatin 80 MG tablet  Commonly known as:  LIPITOR  Take 80 mg by mouth daily.     CALCIUM-MAGNESIUM-ZINC PO  Take 3 tablets by mouth at bedtime. 1000 mg calcium, 400 mg magnesium, 25 mg zinc     carvedilol 6.25 MG tablet  Commonly known as:  COREG  Take 0.5 tablets (3.125 mg total) by mouth 2 (two) times daily with a meal.     EYE VITAMINS PO  Take 1 capsule by mouth 2 (two) times daily.     fexofenadine 180 MG tablet  Commonly known as:  ALLEGRA  Take 180 mg by mouth daily as needed (for allergies).     Fish Oil 1200 MG Caps  Take 1 capsule by mouth daily.     furosemide 20 MG tablet  Commonly known as:  LASIX  Take 20 mg by mouth daily.     levothyroxine 112 MCG tablet  Commonly known as:  SYNTHROID  Take 1 tablet (112 mcg total) by mouth daily.     multivitamin tablet  Take 1 tablet by mouth daily.     nitroGLYCERIN 0.4 MG SL tablet  Commonly known as:  NITROSTAT  Place 0.4 mg under the  tongue every 5 (five) minutes as needed for chest pain. For chest pain        Disposition   The patient will be discharged in stable condition to home. Discharge Orders   Future Appointments Provider Department Dept Phone   10/08/2013 2:10 PM [facility] [phone]   10/15/2013 2:00 PM Randall Hiss, MD [facility] [phone]   10/22/2013 4:00 PM Duke Salvia, MD [facility] Office [phone]   03/30/2014 1:00 PM [facility] [address] [phone]   03/30/2014 2:00 PM [facility] [address] [phone]   03/30/2014 2:30 PM Pryor Ochoa, MD [facility] [phone]   Future Orders Complete By Expires   Beta Blocker already ordered  As directed    Contraindication to ACEI at discharge  As directed    Scheduling Instructions:     hypotension   Diet - low sodium heart healthy  As directed    Face-to-face encounter (required for Medicare/Medicaid patients)  As directed    Comments:     I Ulla Potash B certify that this patient is under my care and that I, or a nurse practitioner or physician's assistant working with me, had a face-to-face encounter that meets the physician face-to-face encounter requirements with this patient on 09/29/2013. The encounter with the patient was in whole, or in part for the following medical condition(s) which is the primary reason for home health care (List medical condition): chronic systolic heart failure   Questions:     The encounter with the patient was in whole, or in part, for the following medical condition, which is the primary reason for home health care:  chronic systolic heart failure   I certify that, based on my findings, the following services are medically necessary home health services:  Nursing   Physical therapy   My clinical  findings support the need for the above services:  Shortness of breath with activity   Further, I certify that my clinical findings support that this patient is homebound due to:  Unable to leave home safely without assistance   Reason for Medically Necessary Home Health Services:  Skilled Nursing- Change/Decline in Patient Status   Heart failure home health orders  As directed    Comments:     Heart Failure Follow-up Care:  Verify follow-up appointments per Patient Discharge Instructions. Confirm transportation arranged. Reconcile home medications with discharge medication list. Remove discontinued medications from use. Assist patient/caregiver to manage medications using pill box. Reinforce low sodium food selection Assessments: Vital signs and oxygen saturation at each visit. Assess home environment for safety concerns, caregiver support and availability of low-sodium foods. Consult Child psychotherapist, PT/OT, Dietitian, and CNA based on assessments. Perform comprehensive cardiopulmonary assessment. Notify MD for any change in condition or weight gain of 3 pounds in one day or 5 pounds in one week with symptoms. Daily Weights and Symptom Monitoring: Ensure patient has access to scales. Teach patient/caregiver to weigh daily before breakfast and after voiding using same scale and record.    Teach patient/caregiver to track weight and symptoms and when to notify Provider. Activity: Develop individualized activity plan with patient/caregiver.   Questions:     Skilled Nurse to notify MD of weight trends weekly for first 2 weeks. May fax or call:     Heart Failure Follow-up Care:  Advanced Heart Failure ([facility]F) Clinic at [phone]   Home Health Visits:     Obtain the following labs:  Other see comments   Lab frequency:  Other see comments   Fax lab results to:  AHF Clinic at [phone]   Diet:  Low Sodium Heart Healthy   Fluid restrictions:  2000 mL Fluid   Consult:     Initiate Heart Failure Clinic  Diuretic Protocol to be used by  [facility] only ( to be ordered by Heart Failure Team Providers Only):     Heart Failure patients record your daily weight using the same scale at the same time of day  As directed    Increase activity slowly  As directed    STOP any activity that causes chest pain, shortness of breath, dizziness, sweating, or exessive weakness  As directed      Follow-up Information   Follow up with Arvilla Meres, MD On 10/08/2013. (@ 2:10 pm; gate code 0003)    Specialty:  Cardiology   Contact information:   [address] [place] [address] [phone]         Duration of Discharge Encounter: Greater than 35 minutes   Signed, Aundria Rud  09/29/2013, 2:45 PM  Patient seen and examined with Ulla Potash, NP. We discussed all aspects of the encounter. I agree with the assessment and plan as stated above. I have edited the not to reflect my changes.   We will follow closely in HF Clinic. VAD workup ongoing.  Ruari Duggan,MD 2:56 PM

## 2013-09-28 NOTE — Progress Notes (Signed)
SUBJECTIVE:  Afebrile overnight. 24 hr I/O -1.4 liters. Weight down 1 lb. Denies SOB, orthopnea, or CP.   PHYSICAL EXAM Filed Vitals:   09/27/13 1700 09/27/13 2017 09/28/13 0218 09/28/13 0502  BP: 105/72 121/78 101/59 100/61  Pulse: 70 72 68 69  Temp:  98 F (36.7 C) 98.1 F (36.7 C) 97.9 F (36.6 C)  TempSrc:  Oral Oral Oral  Resp:  20 20 20   Height:      Weight:    190 lb 0.6 oz (86.2 kg)  SpO2:  95% 95% 97%   General:  No distress Lungs:  Clear Heart:  RRR Abdomen:  Positive bowel sounds, no rebound no guarding Extremities:  No edema  LABS: Lab Results  Component Value Date   TROPONINI <0.30 09/24/2013   Results for orders placed during the hospital encounter of 09/15/13 (from the past 24 hour(s))  CBC WITH DIFFERENTIAL     Status: Abnormal   Collection Time    09/28/13  6:00 AM      Result Value Range   WBC 5.2  4.0 - 10.5 K/uL   RBC 3.49 (*) 4.22 - 5.81 MIL/uL   Hemoglobin 11.4 (*) 13.0 - 17.0 g/dL   HCT 41.3 (*) 24.4 - 01.0 %   MCV 96.0  78.0 - 100.0 fL   MCH 32.7  26.0 - 34.0 pg   MCHC 34.0  30.0 - 36.0 g/dL   RDW 27.2  53.6 - 64.4 %   Platelets 263  150 - 400 K/uL   Neutrophils Relative % 63  43 - 77 %   Neutro Abs 3.3  1.7 - 7.7 K/uL   Lymphocytes Relative 16  12 - 46 %   Lymphs Abs 0.9  0.7 - 4.0 K/uL   Monocytes Relative 16 (*) 3 - 12 %   Monocytes Absolute 0.8  0.1 - 1.0 K/uL   Eosinophils Relative 4  0 - 5 %   Eosinophils Absolute 0.2  0.0 - 0.7 K/uL   Basophils Relative 1  0 - 1 %   Basophils Absolute 0.1  0.0 - 0.1 K/uL  COMPREHENSIVE METABOLIC PANEL     Status: Abnormal   Collection Time    09/28/13  6:00 AM      Result Value Range   Sodium 133 (*) 135 - 145 mEq/L   Potassium 4.5  3.5 - 5.1 mEq/L   Chloride 100  96 - 112 mEq/L   CO2 23  19 - 32 mEq/L   Glucose, Bld 88  70 - 99 mg/dL   BUN 20  6 - 23 mg/dL   Creatinine, Ser 0.34  0.50 - 1.35 mg/dL   Calcium 8.6  8.4 - 74.2 mg/dL   Total Protein 6.0  6.0 - 8.3 g/dL   Albumin 2.8  (*) 3.5 - 5.2 g/dL   AST 84 (*) 0 - 37 U/L   ALT 137 (*) 0 - 53 U/L   Alkaline Phosphatase 160 (*) 39 - 117 U/L   Total Bilirubin 0.6  0.3 - 1.2 mg/dL   GFR calc non Af [name] 56 (*) >90 mL/min   GFR calc Af [name] 65 (*) >90 mL/min    Intake/Output Summary (Last 24 hours) at 09/28/13 0852 Last data filed at 09/28/13 0835  Gross per 24 hour  Intake    603 ml  Output   1800 ml  Net  -1197 ml    ASSESSMENT AND PLAN:  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  Continue  amiodarone.   CHRONIC SYSTOLIC HEART FAILURE:  Eventual consideration of LVAD. Stable overnight. Abulated 460 ft with CR this am. Not currently on any HF meds, will try to restart low dose coreg 3.125 mg BID. Will place orders for [facility] RN/PT. Would like to send home and if can remain afebrile for 2 weeks then come back to hospital for optimization for LVAD. Social work evaluation today. He has had preliminary work-up of LVAD.   CAD: No active ischemia. Continue current therapy.  FEVER:   Day eight of antibiotics. Followed by ID they recommend 10 days. Hope to go home tomorrow. Question aspiration pneumonia.      Aundria Rud 09/28/2013 8:52 AM  Patient seen and examined with Ulla Potash, NP. We discussed all aspects of the encounter. I agree with the assessment and plan as stated above.   He looks much better today. Remains afebrile. Will keep in house one more day and plan to discharge him tomorrow. He will complete 10 days abx as per ID recs.  We will follow him for 2 weeks off abx. If remains afebrile we will consider VAD placement at that point. Hopefully VT will remain quiescent.   Continue rehab.  Adalid Beckmann,MD 9:40 AM

## 2013-09-28 NOTE — Progress Notes (Signed)
Report given to receiving RN. Patient is stable with no verbal complaints. No apparent signs or symptoms of distress or discomfort. 

## 2013-09-28 NOTE — Progress Notes (Signed)
BP is 92/56 and HR is 69. Patient is asymptomatic with no verbal complaints and no signs or symptoms of distress or discomfort. PA notified. Ok to hold scheduled coreg. Will continue to monitor patient for further changes in condition.

## 2013-09-29 LAB — CBC WITH DIFFERENTIAL/PLATELET
Basophils Absolute: 0.1 10*3/uL (ref 0.0–0.1)
Eosinophils Relative: 4 % (ref 0–5)
HCT: 33.2 % — ABNORMAL LOW (ref 39.0–52.0)
Hemoglobin: 10.9 g/dL — ABNORMAL LOW (ref 13.0–17.0)
Lymphocytes Relative: 16 % (ref 12–46)
MCV: 98.8 fL (ref 78.0–100.0)
Monocytes Absolute: 1 10*3/uL (ref 0.1–1.0)
Monocytes Relative: 21 % — ABNORMAL HIGH (ref 3–12)
Neutro Abs: 2.7 10*3/uL (ref 1.7–7.7)
Platelets: 269 10*3/uL (ref 150–400)
RDW: 15.6 % — ABNORMAL HIGH (ref 11.5–15.5)
WBC: 4.8 10*3/uL (ref 4.0–10.5)

## 2013-09-29 LAB — COMPREHENSIVE METABOLIC PANEL
AST: 51 U/L — ABNORMAL HIGH (ref 0–37)
BUN: 19 mg/dL (ref 6–23)
CO2: 27 mEq/L (ref 19–32)
Calcium: 8.6 mg/dL (ref 8.4–10.5)
Chloride: 102 mEq/L (ref 96–112)
Creatinine, Ser: 1.46 mg/dL — ABNORMAL HIGH (ref 0.50–1.35)
GFR calc Af Amer: 53 mL/min — ABNORMAL LOW (ref >90)
GFR calc non Af Amer: 46 mL/min — ABNORMAL LOW (ref >90)
Glucose, Bld: 84 mg/dL (ref 70–99)
Potassium: 5 mEq/L (ref 3.5–5.1)
Total Bilirubin: 0.4 mg/dL (ref 0.3–1.2)

## 2013-09-29 LAB — PROTEIN ELECTROPHORESIS, SERUM
Alpha-1-Globulin: 8.5 % — ABNORMAL HIGH (ref 2.9–4.9)
Alpha-2-Globulin: 15.2 % — ABNORMAL HIGH (ref 7.1–11.8)
M-Spike, %: NOT DETECTED g/dL
Total Protein ELP: 5.3 g/dL — ABNORMAL LOW (ref 6.0–8.3)

## 2013-09-29 MED ORDER — AMOXICILLIN-POT CLAVULANATE 875-125 MG PO TABS: 1.0000 | ORAL_TABLET | Freq: Two times a day (BID) | ORAL | Status: DC

## 2013-09-29 MED ORDER — AMOXICILLIN-POT CLAVULANATE 875-125 MG PO TABS
1.0000 | ORAL_TABLET | Freq: Two times a day (BID) | ORAL | Status: DC
  Administered 2013-09-29 (×2): 1 via ORAL
  Filled 2013-09-29 (×2): qty 1

## 2013-09-29 NOTE — Progress Notes (Signed)
CARDIAC REHAB PHASE I   PRE:  Rate/Rhythm: 70 pacing    BP: sitting 100/70    SaO2: 100 RA  MODE:  Ambulation: 760 ft   POST:  Rate/Rhythm: 90 pacing    BP: sitting 122/82     SaO2: 99 RA  Tolerated very well with RW. No c/o. Sts he began to fatigue toward end but increased distance today. Sts he has RW and cane at home and a good place to walk. Eager to "get back to his norm". Gave ex gl and discussed HF (wts and low sodium). Voiced understanding.  [phone]   Elissa Lovett [place] CES, ACSM 09/29/2013 1:14 PM

## 2013-09-29 NOTE — Progress Notes (Signed)
LVAD assessment completed with patient today by Lasandra Beech, LCSW and this write.  SW LVAD assessment to follow.  Patient presents in good spirits and is able to verbalize a strong understanding of the VAD placement process as strong coping skills.  He has an excellent relationship with his significant other- Hilda Lias- who plans to take several weeks off work to be with patient after surgery.  It is the recommendation by Social Work to proceed with consideration of LVAD implantation by the Heart Failure team.  Lorri Frederick. [place]  [phone]

## 2013-09-29 NOTE — Progress Notes (Signed)
SUBJECTIVE:  Doing well. Denies SOB, orthopnea or CP. Walked 450 ft with PT yesterday. Afebrile overnight. Weight stable. Having social work evaluation for possible LVAD this am.   PHYSICAL EXAM Filed Vitals:   09/28/13 1703 09/28/13 2036 09/29/13 0506 09/29/13 0634  BP: 110/68 110/58 99/60 105/62  Pulse: 70 69 71 70  Temp:  98.4 F (36.9 C) 98.1 F (36.7 C)   TempSrc:  Oral Oral   Resp:  18 17   Height:      Weight:   189 lb 6 oz (85.9 kg)   SpO2:  99% 99%    General:  No distress, sitting in chair.  Neck: no JVD Lungs:  Clear Heart:  RRR, no rubs, gallops or murmurs Abdomen:  Positive bowel sounds, no rebound no guarding Extremities:  No edema, no clubbing or cyanosis Neuro: A&Ox3, moving all 4 extremities.   LABS: Lab Results  Component Value Date   TROPONINI <0.30 09/24/2013   Results for orders placed during the hospital encounter of 09/15/13 (from the past 24 hour(s))  CBC WITH DIFFERENTIAL     Status: Abnormal   Collection Time    09/29/13  5:55 AM      Result Value Range   WBC 4.8  4.0 - 10.5 K/uL   RBC 3.36 (*) 4.22 - 5.81 MIL/uL   Hemoglobin 10.9 (*) 13.0 - 17.0 g/dL   HCT 45.4 (*) 09.8 - 11.9 %   MCV 98.8  78.0 - 100.0 fL   MCH 32.4  26.0 - 34.0 pg   MCHC 32.8  30.0 - 36.0 g/dL   RDW 14.7 (*) 82.9 - 56.2 %   Platelets 269  150 - 400 K/uL   Neutrophils Relative % 56  43 - 77 %   Neutro Abs 2.7  1.7 - 7.7 K/uL   Lymphocytes Relative 16  12 - 46 %   Lymphs Abs 0.8  0.7 - 4.0 K/uL   Monocytes Relative 21 (*) 3 - 12 %   Monocytes Absolute 1.0  0.1 - 1.0 K/uL   Eosinophils Relative 4  0 - 5 %   Eosinophils Absolute 0.2  0.0 - 0.7 K/uL   Basophils Relative 2 (*) 0 - 1 %   Basophils Absolute 0.1  0.0 - 0.1 K/uL    Intake/Output Summary (Last 24 hours) at 09/29/13 0940 Last data filed at 09/29/13 0920  Gross per 24 hour  Intake   1043 ml  Output   1750 ml  Net   -707 ml    ASSESSMENT AND PLAN:  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  Continue  amiodarone.   CHRONIC SYSTOLIC HEART FAILURE: Stable overnight. Fluid status stable, not on any diuretics. Started low dose coreg yesterday, which he only received one dose. Denies dizziness. SBP 90-100s will put a hold parameter for SBP < 85. Going home today with [facility] RN and PT Novella Olive). He still needs to be presented formally to [facility] meeting to discuss if he remains afebrile for 2 weeks if he would be a LVAD candidate. Completing social work evaluation today.   CAD: No active ischemia. Continue current therapy.  FEVER:   A-febrile. Followed by ID they recommend 10 days. Will go home today with continued augmenting until he has completed course.        Ulla Potash B NP-C 09/29/2013 9:40 AM  Patient seen and examined with Ulla Potash, NP. We discussed all aspects of the encounter. I agree with the assessment and plan as  stated above.   Doing great. No further fevers. HF and VT stable. Ok for d/c. Needs to complete 10-day course of antibiotics. Will watch for 2 weeks and if remains afebrile will resume VAD work-up.   Daniel Bensimhon,MD 2:52 PM

## 2013-09-29 NOTE — Progress Notes (Signed)
DC IV, DC Tele, DC Home. Discharge instructions and home medications discussed with patient and patient's family member. Patient and family denied any questions or concerns at this time. Patient leaving unit via wheelchair and appears in no acute distress. 

## 2013-09-29 NOTE — Progress Notes (Signed)
PA called and orders were given to administer patient's PO antibiotic prior to discharge.

## 2013-10-02 LAB — HISTOPLASMA ANTIGEN, URINE: Histoplasma Antigen, urine: <0.5 ng/mL

## 2013-10-03 LAB — CRYOGLOBULIN

## 2013-10-07 NOTE — Progress Notes (Signed)
[place] CLINICAL SOCIAL WORK DOCUMENTATION LVAD (Left Ventricular Assist Device) Psychosocial Screening Please remember that all information is confidential within the members of the VAD team and [facility]  10/07/2013 11:58:03 AM  Patient:  [name]  MRN:  [id number]  Account:  [id number]  Clinical Social Worker:  Merryl Hacker, LCSW Date/Time Initiated:   09/29/2013 10:55 AM Referral Source:    Hessie Diener, VAD Coordinator  Referral Reason:   LVAD Placement  Source of Information:   Patient, [name] (Significant other)   PATIENT DEMOGRAPHICS NAME:   [name]     DOB:  07/07/1941  SS#:  [id number] Address:   [address]  [place] [address] Home Phone:  [phone]  Cell Phone:   [phone] Marital Status:   single- lives with Significant other for the past 14 years     Primary Language:  ENGLISH  Faith:  NONE Adherence with Medical Regimen:   Good follow through with medical regimen and very supportive Significant other to assist when needed.  Medication Adherence:   Patient prefills own medication boxes and denies any concerns  Physician appointment attendance:   compliant   Do you have a Living Will or Medical POA?  Y Would you like to complete a Living Will and Medical POA prior to surgery?  N Are you currently a DNR?  N Do you have a MOST form?  N Would you like to review one?  Y Do you have goals of care?  Y   Have you had a consult with the palliative care team at [facility]?  Y Comment:  [name] states that he met with Frank Creed, NP and discussed goals of care.  Psychological Health Appearance:   Patient was sitting up in chair and dressed in hospital gown. He was well groomed and good hygiene.  Mental status:   Alert and Oriented  Eye Contact:   Excellent  Thought Content:   Patient verbalized thoughts clearly and answered appropriately.  Speech:   Strong and clear  Mood:    Patient was in good spirits and very animated during interview.  Affect:   Positive affect  Insight:   Very realistic and intuitive  Judgment:   Good sound judgement  Interaction Style:   Very realistic and intuitive   Family/Social Information Who lives in your home? Name9  [name]   Age9  72   Relationship to you9  significant other for 14 years    Do you have a plan for child care if relevant?   n/a  List family members outside the home (parents, friends, pastor, etc..) Name2  [name]   Age2  72yo   Relationship to [name]  daughter    Please list people who give you emotional support (family, parents, friends, pastor, etc..)     Who is your primary and backup support pre and post-surgery? Explain the relationships i.e. strengths/weakness, etc.:   Primary-[name]-SO- He stated " she will do what she needs to do" Patient feels very supported by SO.  Secondary Caregiver identified:   Daughter- [name] (cell-[phone])   Legal Do you currently have any legal issues/problems?   None  Durable POA or Legal Next of Kin:   [name]- daughter (next of kin)  [phone]   Living situation Travel distance to [facility]:  45 minutes  Patient reported that in an emergency he would go to [place] or [place] until stable and request transfer  to [facility].  Second Hand Smoke Exposure:   none  Self- Care level:   Independent with ADL's  Ambulation:   Independent prior to recent hospitalization. Currently using a walker and reports has a cane at home if needed.  Transportation:   2 working cars in household- [name] (SO) will drive until patient cleared.  [address] [employer] and [address] both with airbags.  Limitations:   none noted  Barriers impacting ability to participate in care:   none noted   Community Are you active with community agencies/resources?   none prior to hospitalization although will have [place] [facility] post d/c   Are you active in a church, Conservation officer, nature, mosque, or other faith based community?   n/a  What other sources do you have for spiritual support?   denied need  Are you active in any clubs or social organizations?   Weekly golf league  What do you do for fun? hobbies, interests?  Reading, watching sports and Golfing   Education/Work information What is the last grade of school you completed?  High School (12)  Preferred method of learning? (Written, verbal, hands on):   "I like doing" hands on. He also loves to read and likes to have written material as well.  Do you have any problems with reading or writing?  none  Are you currently employed? If no, when were you last employed?   Yes  Name of employer:   [employer]  Please describe what kind of work you do/did?   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   9 years at [employer] and previously worked at [employer] for 31 years and retired at age 38. 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  If you are not currently working, do you plan to return to work after VAD surgery?   n/a  If yes, what type of employment do you hope to find?   n/a  Are you interested in job training or learning new skills?   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 If so, what branch?   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    SSA [phone]) Pension ($533.50) and earned income from Security Position  Do you have difficulty meeting your monthly expenses?   none  If yes, which ones?   How do you usually cope with this?    denied any financial issues  Primary Health Insurance:    Medicare  Secondary Insurance:    BC/BS  Have you applied for Medicaid?    n/a  Have you applied for Social Security Disability?    n/a  Do you have prescription coverage?    Yes  What are your prescription co-pays?    varies  depending on "whether or not I am in the doughnut hole"  Are you required to use a certain pharmacy?    no - currently [facility] [place]  Do you have a mail order option for your prescriptions?    no  If yes, what pharmacy do you use for mail order?    n/a  Have you ever refused medication due to cost?    no  Discuss monthly cost for dressing supplies post procedure $150-300    discussed and reviewed and patient denies any concerns with costs  Can you budget for  this monthly expense?    yes   Medical Information Briefly describe your medical history, surgeries and why you are here for evaluation.    Patient stated that he had a MI/angioplasty in 1991. In 2003- double renal bypass and 1st ICD. In 2005 replaced ICD due to recall. In August 2014, had a new ICD placed and complications brought him to this point. He was admitted this hospitalization for VT's and chest pain and required cardioversion. He understands that is will be destination therapy with LVAD.  Are you able to complete your ADL's?    Patient was independent prior and anticipates with help from his significant other post surgery will return to independence with ADL's.  Do you have any history of emotional, medical, physical or verbal trauma?    Patient denies any history of trauma.  Do you have any family history of heart problems?    He reported that his father had a MI at age 45. He reported that both parents has CHF.  Do you smoke? If so, what is the amount and frequency?    Patient reports that he quit smoking in 2003.  Do you drink alcohol? If so, how many drinks a day/week?    He reports social drink 2x monthly  Have you ever used illegal drugs or misused medications?    denies  If yes, what drugs do you use and how often?   Have you ever been treated for substance abuse?    denies  If yes, where and when were you treated?   Are you currently using illegal substances?    denies   Mental Health History Have you  ever had problems with depression, anxiety or other mental health issues?   n/a  If yes, have you seen a counselor, psychiatrist or therapist?   If you are currently experiencing problems are you interested in talking with a professional?    n/a  Would you be interested in participating in an LVAD support group?  Y LVAD support group for: Patient Caregivers patient Have you or are you taking any medications for anxiety/depression or any mental health concerns?    n/a  If yes, Please list the medications?   How have you been feeling in the past year?    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  How do you handle stressful situations?   "Don't let it get to me"  What are your coping strategies? Please List:    "Think about something else" He also stated that often he will focus on "solving the problem" and "play with my dog".  Have you had any past or current thoughts of suicide?    denies  How many hours do you sleep at night?    8-9 hours  How is your appetite?   Good  PHQ2- Depression Screen:    0  PHQ9 Depression screen (only complete if the PHQ2 is positive):    n/a   Plan for VAD Implementation Do you know and understand what happens during VAD surgery?  Please describe your thoughts:   Patient's understanding of the surgery is realistic and accurate. He stated "it will keep me alive". He states that he has seen a model of the VAD and spoke with a current VAD patient.  What do you know about the risks of any major surgery or use of general anesthesia?    Patient  appeared to have clear understanding of the risks of death and infection with the procedure.  What do you know about the risks and side effects associated with VAD surgery?    Patient verbalized understanding of risks.  Explain what will happen right after surgery?    Patient verbalized understanding  of intubation and ICU stay and then transfer to unit until discharge.  Information obtained from:    Patient  What is your plan for transportation for the first 8 weeks post-surgery? (Patients are not recommended to drive post-surgery for 8 weeks)    [name] will provide all transportation needs  Driver: [name]- significant other  Valid license:    yes  Working vehicle/last inspection:    2 household cars both with current inspections  Airbags:  yes Do you plan to disarm the airbags- there is a risk of discharging the device if the airbag were to deploy.   Yes, Patient plans to disarm airbags in both cars and wil sit in the back seat if unable to disarm. Frank (SO) verbalized as well.  What do you know about your diet post-surgery?    Heart Healthy diet  How do you plan to monitor your medications, current and future?    Patient reports that he will continue to pre fill his boxes.  How do you plan to complete ADL's post-surgery (Shower, dress, etc.)?    Patient reports that [name] (SO) will assist with ADL's as needed.  Will it be difficult to ask for help for your caregivers? If so, explain:    Patient denies any concerns with asking for help.  Please explain what you hope will be improved about your life as a result of receiving the VAD    Patient reports that the VAD will "help me continue to do what I was doing like getting back to golfing and working" "I am looking forward to life"  Please tell me your biggest concern or fear about receiving the VAD?    denies any concerns or fears  How do you cope with your concerns/fears?    Patient reports that he is positive and will focus "getting back my life".  Please explain your understanding of how your body will change? Are you worried about these changes?    Patient verbalized understanding of the physical changes that will occur to his body and denies any concerns at this time.  Do you see any barriers to your surgery or follow up?  If yes, please explain:   Patient denies any barriers and appears realistic. He states "I just want back what I had".     Understanding of LVAD Surgical procedures and risks:    discussed and reviewed  Electrical need for LVAD:    Patient verbalizes understanding and confirms 3 prong outlets. Discussed and reviewed options and back up plans. [name] stated that they lived within a 2 minute walk to the [employer] and can access emergency generators if loss of power.  Safety precautions with LVAD (Water, etc.):   discussed and reviewed  Potential side effects with LVAD:    discussed and reviewed  Types of Advanced heart failure therapies available:    discussed and reviewed  LVAD daily self-care (dressing changes, computer check, extra supplies):    Discussed dressing changes, need for supplies and ADL support. Patient verbalized understanding.  Outpatient follow-up (follow-up in LVAD clinic; monitoring blood thinners)    discussed and reviewed  Need for emergency planning:  discussed and reviewed  Expectations for LVAD:    Accurate and realistic  Current level of motivation to prepare for LVAD:    Patient appears highly motivated to "get back by life" and has positive attitude regarding living with LVAD.  Patient's perception of need for LVAD:    accurate and realistic  Present level of consent for LVAD:    Highly motivated  Reasons for seeking LVAD:    Patient is determined to return to his previous leveling of functioning and activities.   Psychosocial Protective Factors  [name] appears to have a positive outlook with good coping skills and a supportive significant other to assist with the process. He has no history of depression, anxiety, substance abuse or psychiatric concerns. He is highly motivated to "get back his life". He appears to have good understanding of surgery and recovery process. He is compliant with medical follow up, medications and denies any financial  concerns related to healthcare needs. He has reliable transportation and family support to assist with recovery needs. He verbalized understanding of advanced emergency needs and electrical concerns. 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 Psychosocial Risk Factors  [name] denies any risk factors and welcomes the opportunity to regain his life with the LVAD.  Clinical Intervention: CSW will provide support and evaluate needs during recovery process.   Educated patient/family on the following Caregiver(s) role responsibilities:   Discussed caregiver role with [name] who verbalizied understanding of care needs and anticipated support needs of patient. [name] stated "I don't panic". She appeared confident in caregiving needs and stated that she won't hesitate to ask questions or for help if she needs it. She plans on taking leave from work to stay with patient during the first few weeks of recovery.  Financial planning for LVAD:    Discussed potential financial needs and will continue to follow and review throughout recovery process.  Role of Clinical Social Worker:    CSW discussed role and support throughout recovery process.  Signs of depression and anxiety:    Discussed signs and follow up should any need arise. No signs or smyptoms at present.  Support planning for LVAD:    Discussed and reviewed  LVAD process:    Discussed and reviewed- most information provided by LVAD Coordinator  Caregiver contract/agreement:   Discussed and reviewed- information and contract provided by LVAD Coordinator  Discussed Referral(s) to:    Discussed LVAD Support Group although patient has minimal interest at this time. CSW will continue to monitor and assess need for Support Group.  Community Resources:    None at this time- CSW will continue to assess throughout length of recovery.  Clinical Impression Recommendations:    [name] is an  excellent psychosocial candidate for LVAD implantation. He verbalized understanding of his complex medical needs and compliance with required follow up. He has a very supportive and knowledgeable caregiver who will take leave from work to assist with care needs during recovery. He has no history of mental health concerns or substance abuse and appears to have a positive outlook with good coping skills for VAD success. [name] verbalized his ability to adjust to life changes and focusing on "getting my life back" as his motivation. He denies any financial concerns related to recovery and does not appear to have any psychosocial risk factors at present. CSW will continue to follow for support and any other needs that might arise during  the recovery process. Lasandra Beech, LCSW [phone]

## 2013-10-08 ENCOUNTER — Encounter: Payer: Self-pay | Admitting: Internal Medicine

## 2013-10-08 ENCOUNTER — Encounter: Payer: Self-pay | Admitting: Cardiology

## 2013-10-08 ENCOUNTER — Telehealth: Payer: Self-pay | Admitting: Internal Medicine

## 2013-10-08 ENCOUNTER — Ambulatory Visit (HOSPITAL_COMMUNITY)
Admit: 2013-10-08 | Discharge: 2013-10-08 | Disposition: A | Discharge status: Home / Self Care | Payer: Medicare Other | Source: Ambulatory Visit | Attending: Cardiology | Admitting: Cardiology

## 2013-10-08 ENCOUNTER — Encounter (HOSPITAL_COMMUNITY): Payer: Self-pay

## 2013-10-08 VITALS — BP 110/60 | HR 86 | Ht 72.0 in | Wt 191.8 lb

## 2013-10-08 DIAGNOSIS — I471 Supraventricular tachycardia: Secondary | ICD-10-CM

## 2013-10-08 DIAGNOSIS — I5022 Chronic systolic (congestive) heart failure: Secondary | ICD-10-CM

## 2013-10-08 DIAGNOSIS — I498 Other specified cardiac arrhythmias: Secondary | ICD-10-CM | POA: Insufficient documentation

## 2013-10-08 DIAGNOSIS — I2589 Other forms of chronic ischemic heart disease: Secondary | ICD-10-CM

## 2013-10-08 DIAGNOSIS — I255 Ischemic cardiomyopathy: Secondary | ICD-10-CM

## 2013-10-08 DIAGNOSIS — I2510 Atherosclerotic heart disease of native coronary artery without angina pectoris: Secondary | ICD-10-CM | POA: Insufficient documentation

## 2013-10-08 DIAGNOSIS — I472 Ventricular tachycardia, unspecified: Secondary | ICD-10-CM | POA: Insufficient documentation

## 2013-10-08 DIAGNOSIS — I4729 Other ventricular tachycardia: Secondary | ICD-10-CM | POA: Insufficient documentation

## 2013-10-08 DIAGNOSIS — N289 Disorder of kidney and ureter, unspecified: Secondary | ICD-10-CM | POA: Insufficient documentation

## 2013-10-08 DIAGNOSIS — N259 Disorder resulting from impaired renal tubular function, unspecified: Secondary | ICD-10-CM

## 2013-10-08 LAB — COMPREHENSIVE METABOLIC PANEL
ALT: 32 U/L (ref 0–53)
AST: 30 U/L (ref 0–37)
Albumin: 3.5 g/dL (ref 3.5–5.2)
Alkaline Phosphatase: 140 U/L — ABNORMAL HIGH (ref 39–117)
BUN: 26 mg/dL — ABNORMAL HIGH (ref 6–23)
Chloride: 102 mEq/L (ref 96–112)
GFR calc Af Amer: 50 mL/min — ABNORMAL LOW (ref >90)
Potassium: 5.1 mEq/L (ref 3.5–5.1)
Sodium: 138 mEq/L (ref 135–145)
Total Protein: 7.2 g/dL (ref 6.0–8.3)

## 2013-10-08 MED ORDER — CARVEDILOL 6.25 MG PO TABS: 6.2500 mg | ORAL_TABLET | Freq: Two times a day (BID) | ORAL | Status: DC

## 2013-10-08 NOTE — Progress Notes (Signed)
Patient ID: [name], male   DOB: 1941-05-10, 72 y.o.   MRN: [id number]  EP: Dr. Graciela Husbands PCP: Dr. [name] [facility])  HPI: [name] is a 72 yo man with an ischemic CM s/p ICD implant, chronic systolic HF (EF 20-25%), paroxysmal VT s/p ablation (09/2011) and repeat (06/2013), CAD, PVD (s/p R fem pop BPG 01/2010; s/p L CFA BPG 05/2002), AAA (s/p repair 04/2002), CAS (s/p L CEA 2008), HTN, arthritis, and TIA.   He was been admitted to the hospital mulitple times in last 6 months d/t Vtach and ICD shocks.  ECHO 09/2013: EF 20-25%, mild AI, mild MR, LA mildly dilated and RV systolic fx mildly reduced.   CPX 09/16/13  Peak VO2 10.9  VE/VCO2 48  RER 1.11  Ve/MVV 43%   Post hospital follow up: Discharged 09/29/13 after being admitted for VT and appropriate ICD shock. He had possible PNA and was treated with antibiotics.  At home and doing ok. Can walk about 100 yds before SOB. + fatigue. Denies fever, orthopnea, CP or shocks. Taking medications as prescribed. SBP 110-120s. Weight stable 186-189 lbs. No further fevers.   ICD was interrogated today: he is in a rate-controlled atrial tachycardia with 2:1 block.   ECG: atrial tachycardia with 2:1 block.   ROS: All systems negative except as listed in HPI, PMH and Problem List.  Past Medical History  Diagnosis Date  . CAD (coronary artery disease)     s/p MI 1982;  s/p DES to CFX 2011;  s/p NSTEMI 4/12 in setting of VTach;  cath 7/12:  LM ok, LAD mid 30-40%, Dx's with 40%, mCFX stent patent, prox to mid RCA occluded with R to R and L to R collats.  Medical Tx was continued  . Bradycardia   . Ventricular tachycardia     s/p AICD;   h/o ICD shock;  Amiodarone Rx. S/P ablation in 2012  . 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 HTN (hypertension)   . HLD (hyperlipidemia)   . Cytopenia   .  Hypothyroidism   . Renal insufficiency   . Claudication   . Chronic systolic heart failure   . Ischemic cardiomyopathy     echo 7/12:  EF 25-30%, mild AI, mod MR, mod LAE  . TIA (transient ischemic attack) 2001  . CVD (cerebrovascular disease)     left CEA 2008  . Stroke   . Anemia   . Inappropriate therapy from implantable cardioverter-defibrillator 04/02/2013    ATP for sinus tach  SVT wavelet identified SVT but was no  passive   . Myocardial infarction 1992  . Anginal pain   . Automatic implantable cardioverter-defibrillator in situ     Medtronic Evera device serial number [id number]    . Sleep apnea     denies wearing mask (05/25/2013)  . Arthritis     "a little in my knees" (05/25/2013)  . Melanoma of ear 2013    "near left ear" (05/25/2013)    Current Outpatient Prescriptions  Medication Sig Dispense Refill  . amiodarone (PACERONE) 200 MG tablet Take 2 tablets (400 mg total) by mouth daily.  30 tablet  4  . aspirin 81 MG tablet Take 81 mg by mouth daily.       [name] atorvastatin (LIPITOR) 80 MG  tablet Take 80 mg by mouth daily.       [name] CALCIUM-MAGNESIUM-ZINC PO Take 3 tablets by mouth at bedtime. 1000 mg calcium, 400 mg magnesium, 25 mg zinc      . carvedilol (COREG) 6.25 MG tablet Take 0.5 tablets (3.125 mg total) by mouth 2 (two) times daily with a meal.  60 tablet  4  . furosemide (LASIX) 20 MG tablet Take 20 mg by mouth daily.   1 tablet    . levothyroxine (SYNTHROID) 112 MCG tablet Take 1 tablet (112 mcg total) by mouth daily.      . Multiple Vitamin (MULTIVITAMIN) tablet Take 1 tablet by mouth daily.       . Multiple Vitamins-Minerals (EYE VITAMINS PO) Take 1 capsule by mouth 2 (two) times daily.       . nitroGLYCERIN (NITROSTAT) 0.4 MG SL tablet Place 0.4 mg under the tongue every 5 (five) minutes as needed for chest pain. For chest pain      . Omega-3 Fatty Acids (FISH OIL) 1200 MG CAPS Take 1 capsule by mouth daily.       No current facility-administered medications for  this encounter.    Filed Vitals:   10/08/13 1401  BP: 110/60  Pulse: 86  Height: 6' (1.829 m)  Weight: 191 lb 12.8 oz (87 kg)  SpO2: 99%   PHYSICAL EXAM: General:  Well appearing. No resp difficulty; GF present HEENT: normal Neck: supple. JVP flat. Carotids 2+ bilaterally; no bruits. No lymphadenopathy or thryomegaly appreciated. Cor: PMI normal. Regular rate & rhythm. No rubs, gallops or murmurs. Lungs: clear Abdomen: soft, nontender, nondistended. No hepatosplenomegaly. No bruits or masses. Good bowel sounds. Extremities: no cyanosis, clubbing, rash, edema Neuro: alert & orientedx3, cranial nerves grossly intact. Moves all 4 extremities w/o difficulty. Affect pleasant.  ASSESSMENT & PLAN: 1. CAD: Stable without chest pain. Continue ASA 81, statin, Coreg.   2. Ischemic cardiomyopathy: EF 20-25%, poor functional capacity on CPX.  NYHA class IIIb symptoms.  He looks euvolemic on exam today.  Weight is stable.  - Can increase Coreg to 6.25 mg bid.   - Continue current Lasix dose.  - Hold off on ACEI/spironolactone at this time with elevated creatinine and high normal K.  Will repeat BMET today.   - He has had LVAD evaluation.  I think he is a good candidate and will be discussed at Monday meeting to establish timing.  Stop fish oil in preparation for possible surgery.   3. VT: Recently quiescent.  Continue amiodarone.  Will need to check LFTs and TSH today on amiodarone.  He has a Medtronic ICD.  4. Rhythm: Atrial tachycardia on ECG and ICD interrogation today.  2:1 block with good rate control.  This rhythm does not require anticoagulation. It has been seen before.  Can consider cardioversion or ablation, but currently tolerating without problems.  5. CKD: BMET today.   Marca Ancona 10/09/2013

## 2013-10-08 NOTE — Telephone Encounter (Signed)
Per Herbert Seta, pt went to heart failure clinic. Industry has been notified.

## 2013-10-08 NOTE — Telephone Encounter (Signed)
New Message  [name]// new pacemaker-- defib gave a low battery tone at 7 am this am// will be at heart failure clinic at 2 pm// Please call back to address the issue.[name]

## 2013-10-08 NOTE — Patient Instructions (Addendum)
Increase coreg to 6.25 mg (1 tablet) twice a day.  Stop taking fish oil.  Will call with lab results and after Monday's meeting.  Do the following things EVERYDAY: 1) Weigh yourself in the morning before breakfast. Write it down and keep it in a log. 2) Take your medicines as prescribed 3) Eat low salt foods-Limit salt (sodium) to 2000 mg per day.  4) Stay as active as you can everyday 5) Limit all fluids for the day to less than 2 liters 6)

## 2013-10-12 ENCOUNTER — Telehealth: Payer: Self-pay | Admitting: Physician Assistant

## 2013-10-12 ENCOUNTER — Encounter (HOSPITAL_COMMUNITY): Payer: Medicare Other

## 2013-10-12 ENCOUNTER — Inpatient Hospital Stay (HOSPITAL_COMMUNITY)
Admission: EM | Admit: 2013-10-12 | Discharge: 2013-10-15 | DRG: 287 | Disposition: A | Discharge status: Home / Self Care | Payer: Medicare Other | Attending: Internal Medicine | Admitting: Internal Medicine

## 2013-10-12 ENCOUNTER — Emergency Department (HOSPITAL_COMMUNITY): Payer: Medicare Other

## 2013-10-12 ENCOUNTER — Encounter (HOSPITAL_COMMUNITY): Payer: Self-pay | Admitting: Emergency Medicine

## 2013-10-12 DIAGNOSIS — Z95810 Presence of automatic (implantable) cardiac defibrillator: Secondary | ICD-10-CM

## 2013-10-12 DIAGNOSIS — N189 Chronic kidney disease, unspecified: Secondary | ICD-10-CM | POA: Diagnosis present

## 2013-10-12 DIAGNOSIS — I4892 Unspecified atrial flutter: Secondary | ICD-10-CM | POA: Diagnosis not present

## 2013-10-12 DIAGNOSIS — D649 Anemia, unspecified: Secondary | ICD-10-CM | POA: Diagnosis present

## 2013-10-12 DIAGNOSIS — I739 Peripheral vascular disease, unspecified: Secondary | ICD-10-CM | POA: Diagnosis present

## 2013-10-12 DIAGNOSIS — I472 Ventricular tachycardia, unspecified: Secondary | ICD-10-CM | POA: Diagnosis present

## 2013-10-12 DIAGNOSIS — M129 Arthropathy, unspecified: Secondary | ICD-10-CM | POA: Diagnosis present

## 2013-10-12 DIAGNOSIS — Z9861 Coronary angioplasty status: Secondary | ICD-10-CM

## 2013-10-12 DIAGNOSIS — I252 Old myocardial infarction: Secondary | ICD-10-CM

## 2013-10-12 DIAGNOSIS — I4729 Other ventricular tachycardia: Secondary | ICD-10-CM | POA: Diagnosis present

## 2013-10-12 DIAGNOSIS — E785 Hyperlipidemia, unspecified: Secondary | ICD-10-CM | POA: Diagnosis present

## 2013-10-12 DIAGNOSIS — I2510 Atherosclerotic heart disease of native coronary artery without angina pectoris: Secondary | ICD-10-CM | POA: Diagnosis present

## 2013-10-12 DIAGNOSIS — Z85820 Personal history of malignant melanoma of skin: Secondary | ICD-10-CM

## 2013-10-12 DIAGNOSIS — I498 Other specified cardiac arrhythmias: Principal | ICD-10-CM | POA: Diagnosis present

## 2013-10-12 DIAGNOSIS — I471 Supraventricular tachycardia: Secondary | ICD-10-CM

## 2013-10-12 DIAGNOSIS — R55 Syncope and collapse: Secondary | ICD-10-CM | POA: Diagnosis present

## 2013-10-12 DIAGNOSIS — G4730 Sleep apnea, unspecified: Secondary | ICD-10-CM | POA: Diagnosis present

## 2013-10-12 DIAGNOSIS — I129 Hypertensive chronic kidney disease with stage 1 through stage 4 chronic kidney disease, or unspecified chronic kidney disease: Secondary | ICD-10-CM | POA: Diagnosis present

## 2013-10-12 DIAGNOSIS — I5022 Chronic systolic (congestive) heart failure: Secondary | ICD-10-CM

## 2013-10-12 DIAGNOSIS — Z7982 Long term (current) use of aspirin: Secondary | ICD-10-CM

## 2013-10-12 DIAGNOSIS — E039 Hypothyroidism, unspecified: Secondary | ICD-10-CM | POA: Diagnosis present

## 2013-10-12 DIAGNOSIS — Z8673 Personal history of transient ischemic attack (TIA), and cerebral infarction without residual deficits: Secondary | ICD-10-CM

## 2013-10-12 DIAGNOSIS — Z87891 Personal history of nicotine dependence: Secondary | ICD-10-CM

## 2013-10-12 DIAGNOSIS — Z8249 Family history of ischemic heart disease and other diseases of the circulatory system: Secondary | ICD-10-CM

## 2013-10-12 DIAGNOSIS — Z823 Family history of stroke: Secondary | ICD-10-CM

## 2013-10-12 DIAGNOSIS — I2589 Other forms of chronic ischemic heart disease: Secondary | ICD-10-CM | POA: Diagnosis present

## 2013-10-12 LAB — URINALYSIS, ROUTINE W REFLEX MICROSCOPIC
Bilirubin Urine: NEGATIVE
Glucose, UA: NEGATIVE mg/dL
Ketones, ur: NEGATIVE mg/dL
Protein, ur: NEGATIVE mg/dL
pH: 5.5 (ref 5.0–8.0)

## 2013-10-12 LAB — COMPREHENSIVE METABOLIC PANEL
ALT: 31 U/L (ref 0–53)
AST: 34 U/L (ref 0–37)
Albumin: 3 g/dL — ABNORMAL LOW (ref 3.5–5.2)
BUN: 30 mg/dL — ABNORMAL HIGH (ref 6–23)
Calcium: 8.8 mg/dL (ref 8.4–10.5)
Creatinine, Ser: 1.8 mg/dL — ABNORMAL HIGH (ref 0.50–1.35)
Potassium: 4.8 mEq/L (ref 3.5–5.1)
Total Bilirubin: 0.3 mg/dL (ref 0.3–1.2)
Total Protein: 6.3 g/dL (ref 6.0–8.3)

## 2013-10-12 LAB — URINE MICROSCOPIC-ADD ON

## 2013-10-12 LAB — TROPONIN I: Troponin I: <0.3 ng/mL (ref <0.30)

## 2013-10-12 LAB — CBC WITH DIFFERENTIAL/PLATELET
Basophils Absolute: 0 10*3/uL (ref 0.0–0.1)
Eosinophils Absolute: 0.1 10*3/uL (ref 0.0–0.7)
Eosinophils Relative: 2 % (ref 0–5)
HCT: 34.9 % — ABNORMAL LOW (ref 39.0–52.0)
Lymphocytes Relative: 12 % (ref 12–46)
Lymphs Abs: 0.7 10*3/uL (ref 0.7–4.0)
MCH: 32.5 pg (ref 26.0–34.0)
MCV: 100.3 fL — ABNORMAL HIGH (ref 78.0–100.0)
Monocytes Absolute: 0.7 10*3/uL (ref 0.1–1.0)
Monocytes Relative: 11 % (ref 3–12)
RBC: 3.48 MIL/uL — ABNORMAL LOW (ref 4.22–5.81)
RDW: 15.6 % — ABNORMAL HIGH (ref 11.5–15.5)
WBC: 5.8 10*3/uL (ref 4.0–10.5)

## 2013-10-12 LAB — PROTIME-INR
INR: 1.19 (ref 0.00–1.49)
Prothrombin Time: 14.8 seconds (ref 11.6–15.2)

## 2013-10-12 MED ORDER — CARVEDILOL 6.25 MG PO TABS
6.2500 mg | ORAL_TABLET | Freq: Two times a day (BID) | ORAL | Status: DC
  Administered 2013-10-13 – 2013-10-15 (×4): 6.25 mg via ORAL
  Filled 2013-10-12 (×6): qty 1

## 2013-10-12 MED ORDER — SODIUM CHLORIDE 0.9 % IV SOLN: 250.0000 mL | INTRAVENOUS | Status: DC | PRN

## 2013-10-12 MED ORDER — ATORVASTATIN CALCIUM 80 MG PO TABS
80.0000 mg | ORAL_TABLET | Freq: Every day | ORAL | Status: DC
  Administered 2013-10-13 – 2013-10-14 (×2): 80 mg via ORAL
  Filled 2013-10-12 (×3): qty 1

## 2013-10-12 MED ORDER — SODIUM CHLORIDE 0.9 % IJ SOLN
3.0000 mL | Freq: Two times a day (BID) | INTRAMUSCULAR | Status: DC
  Administered 2013-10-12 – 2013-10-15 (×6): 3 mL via INTRAVENOUS

## 2013-10-12 MED ORDER — ADULT MULTIVITAMIN W/MINERALS CH: 1.0000 | ORAL_TABLET | Freq: Every day | ORAL | Status: DC

## 2013-10-12 MED ORDER — ACETAMINOPHEN 325 MG PO TABS: 650.0000 mg | ORAL_TABLET | ORAL | Status: DC | PRN

## 2013-10-12 MED ORDER — ENOXAPARIN SODIUM 40 MG/0.4ML ~~LOC~~ SOLN
40.0000 mg | SUBCUTANEOUS | Status: DC
  Administered 2013-10-13: 40 mg via SUBCUTANEOUS
  Filled 2013-10-12 (×3): qty 0.4

## 2013-10-12 MED ORDER — SODIUM CHLORIDE 0.9 % IJ SOLN: 3.0000 mL | INTRAMUSCULAR | Status: DC | PRN

## 2013-10-12 MED ORDER — AMIODARONE HCL 200 MG PO TABS
400.0000 mg | ORAL_TABLET | Freq: Every day | ORAL | Status: DC
  Administered 2013-10-13 – 2013-10-15 (×3): 400 mg via ORAL
  Filled 2013-10-12 (×4): qty 2

## 2013-10-12 MED ORDER — LEVOTHYROXINE SODIUM 112 MCG PO TABS
112.0000 ug | ORAL_TABLET | Freq: Every day | ORAL | Status: DC
  Administered 2013-10-13 – 2013-10-15 (×3): 112 ug via ORAL
  Filled 2013-10-12 (×4): qty 1

## 2013-10-12 MED ORDER — ONDANSETRON HCL 4 MG/2ML IJ SOLN
4.0000 mg | Freq: Four times a day (QID) | INTRAMUSCULAR | Status: DC | PRN
  Administered 2013-10-13: 4 mg via INTRAVENOUS
  Filled 2013-10-12: qty 2

## 2013-10-12 MED ORDER — OCUVITE-LUTEIN PO CAPS
1.0000 | ORAL_CAPSULE | Freq: Every day | ORAL | Status: DC
  Administered 2013-10-13 – 2013-10-15 (×3): 1 via ORAL
  Filled 2013-10-12 (×4): qty 1

## 2013-10-12 MED ORDER — ASPIRIN EC 81 MG PO TBEC
81.0000 mg | DELAYED_RELEASE_TABLET | Freq: Every day | ORAL | Status: DC
  Administered 2013-10-13 – 2013-10-15 (×3): 81 mg via ORAL
  Filled 2013-10-12 (×3): qty 1

## 2013-10-12 NOTE — H&P (Signed)
Advanced Heart Failure Team History and Physical Note    Primary EP: Dr. Graciela Husbands  Primary Physician: Dr. [name] ([place], [place])   Reason for Consultation: Heart failure with recurrent VT (syncope)   HPI:    Oluwaseun Cremer is a 72 yo man with an ischemic CM s/p ICD implant, chronic systolic HF (EF 20-25%), paroxysmal VT s/p ablation (09/2011) and repeat (06/2013), CAD, PVD (s/p R fem pop BPG 01/2010; s/p L CFA BPG 05/2002), AAA (s/p repair 04/2002), CAS (s/p L CEA 2008), HTN, arthritis, and TIA.  Over the past couple of months has had multiple episodes of Vtach with ICD shocks. He was recently discharged from the hospital 09/29/13 for recurrent ICD shock. He was on quindine which was stopped because of possible quindine induced fever, however then repeat CT showed possible aspiration PNA and he was treated with full course of antibiotics. He completed work-up for LVAD and was seen last week in clinic, which at that time he reported no fevers and feeling well.  This am patient reports that he had a syncopal episode and was very diaphoretic. Denies SOB, orthopnea or CP. Vitals were stable per his girlfriend.    Review of Systems: [y] = yes, [ ]  = no   General: Weight gain [ ] ; Weight loss [ ] ; Anorexia [ ] ; Fatigue [Y ]; Fever Klaus.Mock ]; Chills [ ] ; Weakness [ ]   Cardiac: Chest pain/pressure [ ] ; Resting SOB [ ] ; Exertional SOB [Y ]; Orthopnea [ ] ; Pedal Edema [ ] ; Palpitations [ ] ; Syncope [Y ]; Presyncope [ ] ; Paroxysmal nocturnal dyspnea[ ]   Pulmonary: Cough [ ] ; Wheezing[ ] ; Hemoptysis[ ] ; Sputum [ ] ; Snoring [ ]   GI: Vomiting[ ] ; Dysphagia[ ] ; Melena[ ] ; Hematochezia [ ] ; Heartburn[ ] ; Abdominal pain [ ] ; Constipation [ ] ; Diarrhea [ ] ; BRBPR [ ]   GU: Hematuria[ ] ; Dysuria [ ] ; Nocturia[ ]   Vascular: Pain in legs with walking [ ] ; Pain in feet with lying flat [ ] ; Non-healing sores [ ] ; Stroke [ ] ; TIA [ ] ; Slurred speech [ ] ;  Neuro: Headaches[ ] ; Vertigo[ ] ; Seizures[ ] ; Paresthesias[  ];Blurred vision [ ] ; Diplopia [ ] ; Vision changes [ ]   Ortho/Skin: Arthritis [ ] ; Joint pain [ ] ; Muscle pain [ ] ; Joint swelling [ ] ; Back Pain [ ] ; Rash [ ]   Psych: Depression[ ] ; Anxiety[ ]   Heme: Bleeding problems [ ] ; Clotting disorders [ ] ; Anemia [ ]   Endocrine: Diabetes [ ] ; Thyroid dysfunction[ ]   Home Medications Prior to Admission medications   Medication Sig Start Date End Date Taking? Authorizing Provider  amiodarone (PACERONE) 200 MG tablet Take 2 tablets (400 mg total) by mouth daily. 08/31/13  Yes Duke Salvia, MD  aspirin 81 MG tablet Take 81 mg by mouth daily.    Yes Historical Provider, MD  atorvastatin (LIPITOR) 80 MG tablet Take 80 mg by mouth daily.  02/05/11  Yes Historical Provider, MD  CALCIUM-MAGNESIUM-ZINC PO Take 3 tablets by mouth at bedtime. 1000 mg calcium, 400 mg magnesium, 25 mg zinc   Yes Historical Provider, MD  carvedilol (COREG) 6.25 MG tablet Take 1 tablet (6.25 mg total) by mouth 2 (two) times daily with a meal. 10/08/13  Yes Aundria Rud, NP  furosemide (LASIX) 20 MG tablet Take 20 mg by mouth daily.  05/12/13  Yes Duke Salvia, MD  levothyroxine (SYNTHROID) 112 MCG tablet Take 1 tablet (112 mcg total) by mouth daily. 05/25/11  Yes Peter M Swaziland, MD  Multiple  Vitamin (MULTIVITAMIN) tablet Take 1 tablet by mouth daily.    Yes Historical Provider, MD  Multiple Vitamins-Minerals (EYE VITAMINS PO) Take 1 capsule by mouth 2 (two) times daily.    Yes Historical Provider, MD  nitroGLYCERIN (NITROSTAT) 0.4 MG SL tablet Place 0.4 mg under the tongue every 5 (five) minutes as needed for chest pain. For chest pain   Yes Historical Provider, MD    Past Medical History: Past Medical History  Diagnosis Date  . CAD (coronary artery disease)     s/p MI 1982;  s/p DES to CFX 2011;  s/p NSTEMI 4/12 in setting of VTach;  cath 7/12:  LM ok, LAD mid 30-40%, Dx's with 40%, mCFX stent patent, prox to mid RCA occluded with R to R and L to R collats.  Medical Tx was  continued  . Bradycardia   . Ventricular tachycardia     s/p AICD;   h/o ICD shock;  Amiodarone Rx. S/P ablation in 2012  . 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 HTN (hypertension)   . HLD (hyperlipidemia)   . Cytopenia   . Hypothyroidism   . Renal insufficiency   . Claudication   . Chronic systolic heart failure   . Ischemic cardiomyopathy     echo 7/12:  EF 25-30%, mild AI, mod MR, mod LAE  . TIA (transient ischemic attack) 2001  . CVD (cerebrovascular disease)     left CEA 2008  . Stroke   . Anemia   . Inappropriate therapy from implantable cardioverter-defibrillator 04/02/2013    ATP for sinus tach  SVT wavelet identified SVT but was no  passive   . Myocardial infarction 1992  . Anginal pain   . Automatic implantable cardioverter-defibrillator in situ     Medtronic Evera device serial number [id number]    . Sleep apnea     denies wearing mask (05/25/2013)  . Arthritis     "a little in my knees" (05/25/2013)  . Melanoma of ear 2013    "near left ear" (05/25/2013)    Past Surgical History: Past Surgical History  Procedure Laterality Date  . Femoral-popliteal bypass graft Right 2011  . Cardiac defibrillator placement  2003; 2005; 05/25/2013    2014: Medtronic Evera device serial number [id number]  . Revascularization / in-situ graft leg    . Renal artery bypass Bilateral 2003  . Back surgery    . Lumbar disc surgery  1974; 2000's  . Knee arthroscopy Bilateral 1990's  . Elbow arthroscopy Right 1990's  . Cholecystectomy  12/15/?2010  . Lipoma excision Left 2013    "near ear" (05/25/2013)  . Heel spur surgery Right 1990's  . Cataract extraction w/ intraocular lens  implant, bilateral Bilateral 2000  . Carotid endarterectomy Left ~ 2007  . Doppler echocardiography  2011  . Tonsillectomy  1946  . Femoral-popliteal bypass graft Left 05/2002     Hattie Perch 05/06/2002 (05/25/2013)  . Tumor excision Left 1960's    "fatty tumor" (05/25/2013)  . Cardiac electrophysiology study and ablation  2012  . Coronary angioplasty  1992  . Cardiac catheterization      "several" (05/25/2013)  . Coronary angioplasty with stent placement      "last one was 11/2012  (05/25/2013)    Family History: Family History  Problem Relation Age of  Onset  . Coronary artery disease    . Heart attack Mother   . Coronary artery disease Mother   . Coronary artery disease Father   . Stroke Father     Social History: History   Social History  . Marital Status: Divorced    Spouse Name: N/A    Number of Children: 2  . Years of Education: N/A   Occupational History  . retired     [employer]  . Retired     [employer]  . Engineer, structural at [employer]   Social History Main Topics  . Smoking status: Former Smoker -- 0.50 packs/day for 37 years    Types: Cigarettes    Quit date: 11/05/2001  . Smokeless tobacco: Never Used  . Alcohol Use: 0.0 oz/week     Comment: 05/25/2013 "mixed drink couple times/month"  . Drug Use: No  . Sexual Activity: No   Other Topics Concern  . None   Social History Narrative   Lives with sig other.    Allergies:  Allergies  Allergen Reactions  . Demerol [Meperidine]     Paralysis. Could only move eyes.  . Meperidine Hcl Other (See Comments)    "CAN'T MOVE" CATATONIC PER PT.  [name] Opium Other (See Comments)    "CAN'T MOVE" CATATONIC PER PT.    Objective:    Vital Signs:   Temp:  [98.4 F (36.9 C)] 98.4 F (36.9 C) (12/08 0917) Pulse Rate:  [85-98] 95 (12/08 1115) Resp:  [13-22] 19 (12/08 1115) BP: (90-116)/(60-76) 90/60 mmHg (12/08 1115) SpO2:  [98 %-100 %] 98 % (12/08 1115)   There were no vitals filed for this visit.  Physical Exam: General:  Well appearing. No resp difficulty; girlfriend present HEENT: normal Neck: supple. JVP 7-8. Carotids 2+ bilat; no bruits. No lymphadenopathy or  thryomegaly appreciated. Cor: PMI nondisplaced. Regular rate & irregular rhythm. No rubs, gallops or murmurs. Lungs: clear Abdomen: soft, nontender, nondistended. No hepatosplenomegaly. No bruits or masses. Good bowel sounds. Extremities: no cyanosis, clubbing, rash, edema Neuro: alert & orientedx3, cranial nerves grossly intact. moves all 4 extremities w/o difficulty. Affect pleasant  Telemetry: Atrial tach 90s  Labs: Basic Metabolic Panel:  Recent Labs Lab 10/08/13 1503 10/12/13 0951  NA 138 138  K 5.1 4.8  CL 102 104  CO2 28 26  GLUCOSE 98 109*  BUN 26* 30*  CREATININE 1.54* 1.80*  CALCIUM 9.3 8.8    Liver Function Tests:  Recent Labs Lab 10/08/13 1503 10/12/13 0951  AST 30 34  ALT 32 31  ALKPHOS 140* 140*  BILITOT 0.5 0.3  PROT 7.2 6.3  ALBUMIN 3.5 3.0*   No results found for this basename: LIPASE, AMYLASE,  in the last 168 hours No results found for this basename: AMMONIA,  in the last 168 hours  CBC:  Recent Labs Lab 10/12/13 0951  WBC 5.8  NEUTROABS 4.3  HGB 11.3*  HCT 34.9*  MCV 100.3*  PLT 172    Cardiac Enzymes:  Recent Labs Lab 10/12/13 0951  TROPONINI <0.30    BNP: BNP (last 3 results)  Recent Labs  09/15/13 0424 09/15/13 0750 10/12/13 0951  PROBNP 2704.0* 3038.0* 3404.0*    CBG: No results found for this basename: GLUCAP,  in the last 168 hours  Coagulation Studies:  Recent Labs  10/12/13 0951  LABPROT 14.8  INR 1.19     Imaging: Dg Chest 2 View  10/12/2013   CLINICAL DATA:  Syncope  EXAM: CHEST  2 VIEW  COMPARISON:  09/24/2013  FINDINGS: Cardiomediastinal silhouette is unremarkable. Dual lead cardiac pacemaker with leads in right atrium and right ventricle. No acute infiltrate or pulmonary edema.  IMPRESSION: No active cardiopulmonary disease.   Electronically Signed   By: Natasha Mead M.D.   On: 10/12/2013 11:08         Assessment:   1) Syncope 2) Recurrent Vtach - ablation 09/2011 and 06/2013 3) Chronic  systolic HF, EF 20-25% (09/2013) 4) Mixed NICM/ICM 5) CAD 6) PAD   Plan/Discussion:    Mr. Kashuba is very well known to the HF team and was seen last week in clinic at which point he was stable from a HF standpoint. He has completed the LVAD evaluation and was going to be discussed at the [facility] meeting this afternoon to decide on whether he was a suitable candidate for LVAD implantation.  This am reports of syncope and diaphoresis. ICD interrogated and showed [name] which was treated with two bursts of ATP which were successful. He currently is in an atrial tach in the 90s. Will admit to stepdown and discuss with Dr. Graciela Husbands about options for recurrent Vtach such is increasing amiodarone or adding quindine back since it was not probably what caused his fever.  Cr trending up. Will need to follow closely for LVAD implant. Will hold lasix at this time.  Admit to 2H stepdown.  Length of Stay: 0 Aundria Rud 10/12/2013, 12:19 PM  Advanced Heart Failure Team Pager [phone] (M-F; 7a - 4p)  Please contact [facility] for night-coverage after hours (4p -7a ) and weekends on [email or web address]  Patient seen and examined with Ulla Potash, NP. We discussed all aspects of the encounter. I agree with the assessment and plan as stated above.  ICD interrogated personally.   Mr. Lybrand has recurrent VT in the setting of end-stage systolic HF. Will admit to stepdown. Have EP see. He has not had any more fevers and HF is advanced but stable. Based on recent RHC and CPX numbers I feel it is time to proceed with VAD therapy.   Sargon Scouten,MD 2:01 PM

## 2013-10-12 NOTE — Consult Note (Signed)
Pt with atrial tach 2:1>>1:1 conduction with subsequent inappropriate ATP which accelerated SVT>>VT with failed ATP subsequent syncope and then spontaneous termination  His functional status has remained poor notwithstanding the resolution of his fever w 3B-4 CHF symtpoms, related to low output  His Atrial tach is not serving him well so would undertake DCCV in am if unable to overdrive pace atach  Would continue betablockers and Amiodarone for now

## 2013-10-12 NOTE — ED Notes (Signed)
Pharmacy notified of missing medications  

## 2013-10-12 NOTE — ED Notes (Signed)
Consent obtained at bedside.

## 2013-10-12 NOTE — ED Notes (Signed)
EKG was already done in Triage @ 9.05 am

## 2013-10-12 NOTE — ED Provider Notes (Signed)
CSN: [id number]     Arrival date & time 10/12/13  0902 History   First MD Initiated Contact with Patient 10/12/13 [phone]     Chief Complaint  Patient presents with  . Loss of Consciousness   (Consider location/radiation/quality/duration/timing/severity/associated sxs/prior Treatment) HPI Comments: Patient is a 72 year old male with a past medical history of CAD, ischemic cardiomyopathy, hypertension, chronic CHF, and PVD who presents after a syncopal episode that occurred prior to arrival. Patient is presents with his wife at the bedside. Patient reports sitting on a chair while on his computer and reports sudden onset of dizziness and lightheadedness and then lost consciousness "for a couple seconds." Patient reports being diaphoretic when he woke up. This has happened previously and patient reports it is due to his extensive heart history. Patient sees multiple doctors at [facility] and has an ICD in place. He is unsure if the device fired today. Patient is currently asymptomatic. He notified Dr. Gala Romney that he was coming to the ED. Patient denies any other associated symptoms. Patient reports he is waiting to have an LVAD placed.   Patient is a 72 y.o. male presenting with syncope.  Loss of Consciousness Associated symptoms: diaphoresis and dizziness   Associated symptoms: no chest pain, no fever, no nausea, no palpitations, no shortness of breath, no vomiting and no weakness     Past Medical History  Diagnosis Date  . CAD (coronary artery disease)     s/p MI 1982;  s/p DES to CFX 2011;  s/p NSTEMI 4/12 in setting of VTach;  cath 7/12:  LM ok, LAD mid 30-40%, Dx's with 40%, mCFX stent patent, prox to mid RCA occluded with R to R and L to R collats.  Medical Tx was continued  . Bradycardia   . Ventricular tachycardia     s/p AICD;   h/o ICD shock;  Amiodarone Rx. S/P ablation in 2012  . PVD (peripheral vascular disease)     h/o claudication;  s/p R fem to pop BPG 3/11;  s/p L CFA BPG 7/03;   s/p repair of inf AAA 6/03;  s/p aorta to bilat renal BPG 6/03  . HTN (hypertension)   . HLD (hyperlipidemia)   . Cytopenia   . Hypothyroidism   . Renal insufficiency   . Claudication   . Chronic systolic heart failure   . Ischemic cardiomyopathy     echo 7/12:  EF 25-30%, mild AI, mod MR, mod LAE  . TIA (transient ischemic attack) 2001  . CVD (cerebrovascular disease)     left CEA 2008  . Stroke   . Anemia   . Inappropriate therapy from implantable cardioverter-defibrillator 04/02/2013    ATP for sinus tach  SVT wavelet identified SVT but was no  passive   . Myocardial infarction 1992  . Anginal pain   . Automatic implantable cardioverter-defibrillator in situ     Medtronic Evera device serial number [id number]    . Sleep apnea     denies wearing mask (05/25/2013)  . Arthritis     "a little in my knees" (05/25/2013)  . Melanoma of ear 2013    "near left ear" (05/25/2013)   Past Surgical History  Procedure Laterality Date  . Femoral-popliteal bypass graft Right 2011  . Cardiac defibrillator placement  2003; 2005; 05/25/2013    2014: Medtronic Evera device serial number [id number]  . Revascularization / in-situ graft leg    . Renal artery bypass Bilateral 2003  . Back surgery    .  Lumbar disc surgery  1974; 2000's  . Knee arthroscopy Bilateral 1990's  . Elbow arthroscopy Right 1990's  . Cholecystectomy  12/15/?2010  . Lipoma excision Left 2013    "near ear" (05/25/2013)  . Heel spur surgery Right 1990's  . Cataract extraction w/ intraocular lens  implant, bilateral Bilateral 2000  . Carotid endarterectomy Left ~ 2007  . Doppler echocardiography  2011  . Tonsillectomy  1946  . Femoral-popliteal bypass graft Left 05/2002    Hattie Perch 05/06/2002 (05/25/2013)  . Tumor excision Left 1960's    "fatty tumor" (05/25/2013)  . Cardiac electrophysiology study and ablation  2012  . Coronary angioplasty  1992  . Cardiac catheterization      "several" (05/25/2013)  . Coronary angioplasty  with stent placement      "last one was 11/2012  (05/25/2013)   Family History  Problem Relation Age of Onset  . Coronary artery disease    . Heart attack Mother   . Coronary artery disease Mother   . Coronary artery disease Father   . Stroke Father    History  Substance Use Topics  . Smoking status: Former Smoker -- 0.50 packs/day for 37 years    Types: Cigarettes    Quit date: 11/05/2001  . Smokeless tobacco: Never Used  . Alcohol Use: 0.0 oz/week     Comment: 05/25/2013 "mixed drink couple times/month"    Review of Systems  Constitutional: Positive for diaphoresis. Negative for fever, chills and fatigue.  HENT: Negative for trouble swallowing.   Eyes: Negative for visual disturbance.  Respiratory: Negative for shortness of breath.   Cardiovascular: Positive for syncope. Negative for chest pain and palpitations.  Gastrointestinal: Negative for nausea, vomiting, abdominal pain and diarrhea.  Genitourinary: Negative for dysuria and difficulty urinating.  Musculoskeletal: Negative for arthralgias and neck pain.  Skin: Negative for color change.  Neurological: Positive for dizziness and syncope. Negative for weakness.  Psychiatric/Behavioral: Negative for dysphoric mood.    Allergies  Demerol; Meperidine hcl; and Opium  Home Medications   Current Outpatient Rx  Name  Route  Sig  Dispense  Refill  . amiodarone (PACERONE) 200 MG tablet   Oral   Take 2 tablets (400 mg total) by mouth daily.   30 tablet   4   . aspirin 81 MG tablet   Oral   Take 81 mg by mouth daily.          [name] atorvastatin (LIPITOR) 80 MG tablet   Oral   Take 80 mg by mouth daily.          [name] CALCIUM-MAGNESIUM-ZINC PO   Oral   Take 3 tablets by mouth at bedtime. 1000 mg calcium, 400 mg magnesium, 25 mg zinc         . carvedilol (COREG) 6.25 MG tablet   Oral   Take 1 tablet (6.25 mg total) by mouth 2 (two) times daily with a meal.   60 tablet   4     Please file dose change; patient has  medication   . furosemide (LASIX) 20 MG tablet   Oral   Take 20 mg by mouth daily.    1 tablet      . levothyroxine (SYNTHROID) 112 MCG tablet   Oral   Take 1 tablet (112 mcg total) by mouth daily.         . Multiple Vitamin (MULTIVITAMIN) tablet   Oral   Take 1 tablet by mouth daily.          [name]  Multiple Vitamins-Minerals (EYE VITAMINS PO)   Oral   Take 1 capsule by mouth 2 (two) times daily.          . nitroGLYCERIN (NITROSTAT) 0.4 MG SL tablet   Sublingual   Place 0.4 mg under the tongue every 5 (five) minutes as needed for chest pain. For chest pain          BP 107/67  Pulse 98  Temp(Src) 98.4 F (36.9 C) (Oral)  Resp 22  SpO2 99% Physical Exam  Nursing note and vitals reviewed. Constitutional: He is oriented to person, place, and time. He appears well-developed and well-nourished. No distress.  HENT:  Head: Normocephalic and atraumatic.  Eyes: Conjunctivae and EOM are normal.  Neck: Normal range of motion.  Cardiovascular: Normal rate and regular rhythm.  Exam reveals no gallop and no friction rub.   No murmur heard. Pulmonary/Chest: Effort normal and breath sounds normal. He has no wheezes. He has no rales. He exhibits no tenderness.  Abdominal: Soft. He exhibits no distension. There is no tenderness. There is no rebound and no guarding.  Musculoskeletal: Normal range of motion.  Neurological: He is alert and oriented to person, place, and time. Coordination normal.  Speech is goal-oriented. Moves limbs without ataxia.   Skin: Skin is warm and dry.  Psychiatric: He has a normal mood and affect. His behavior is normal.    ED Course  Procedures (including critical care time) Labs Review Labs Reviewed  CBC WITH DIFFERENTIAL - Abnormal; Notable for the following:    RBC 3.48 (*)    Hemoglobin 11.3 (*)    HCT 34.9 (*)    MCV 100.3 (*)    RDW 15.6 (*)    All other components within normal limits  URINALYSIS, ROUTINE W REFLEX MICROSCOPIC - Abnormal;  Notable for the following:    Hgb urine dipstick TRACE (*)    All other components within normal limits  PRO B NATRIURETIC PEPTIDE - Abnormal; Notable for the following:    Pro B Natriuretic peptide (BNP) 3404.0 (*)    All other components within normal limits  COMPREHENSIVE METABOLIC PANEL - Abnormal; Notable for the following:    Glucose, Bld 109 (*)    BUN 30 (*)    Creatinine, Ser 1.80 (*)    Albumin 3.0 (*)    Alkaline Phosphatase 140 (*)    GFR calc non Af [name] 36 (*)    GFR calc Af [name] 41 (*)    All other components within normal limits  URINE CULTURE  PROTIME-INR  TROPONIN I  URINE MICROSCOPIC-ADD ON   Imaging Review Dg Chest 2 View  10/12/2013   CLINICAL DATA:  Syncope  EXAM: CHEST  2 VIEW  COMPARISON:  09/24/2013  FINDINGS: Cardiomediastinal silhouette is unremarkable. Dual lead cardiac pacemaker with leads in right atrium and right ventricle. No acute infiltrate or pulmonary edema.  IMPRESSION: No active cardiopulmonary disease.   Electronically Signed   By: Natasha Mead M.D.   On: 10/12/2013 11:08    EKG Interpretation   None       MDM   1. Chronic systolic heart failure   2. Paroxysmal ventricular tachycardia     10:00 AM Labs, chest xray, troponin, and BNP pending. Vitals stable and patient afebrile. Patient is asymptomatic at this time.   Dr. Prescott Gum NP is with the patient and will admit him.    Emilia Beck, PA-C 10/12/13 [address] PA-C 10/12/13 1405

## 2013-10-12 NOTE — Telephone Encounter (Signed)
Rec'd a call from Ms. [name]. [name] called her home from work because he was feeling very bad and is pale, diaphoretic. No other specific complaints and VS are OK. She wants to know what to do?  Advised her he needs to be seen very soon. Advised EMS but she stated they would only transport him to [place] and she does not wish him to go there.  Advised her that if she feels he would benefit from being seen at [facility], to get him here how she can. Not clear if it's his heart failure or heart rhythm causing the problem, so get him to the ER and he can be evaluated then.

## 2013-10-12 NOTE — Consult Note (Signed)
Patient ID: TILMAN MCCLAREN MRN: [id number], DOB/AGE: 74-19-1942   Admit date: 10/12/2013 Date of Consult: 10/12/2013  Primary Physician: [name], MD Primary Cardiologist / Primary EP: Gala Romney, MD / Graciela Husbands, MD Reason for Consultation: Ventricular tachycardia  History of Present Illness [name] is a 72 y.o. male with a long standing ischemic CM s/p ICD implant, chronic systolic HF (EF 20-25%), paroxysmal VT s/p ablation (09/2011 and 06/2013), CAD, PVD (s/p R fem pop bypass 01/2010, s/p L CEA 2008, s/p L CFA bypass 05/2002), AAA (s/p repair 04/2002), HTN and TIA.  He has struggled recently with refractory VT. He presented last month with slow VT and his device tachy detection parameters were adjusted. His VT zone was reprogrammed to 111 bpm with ATP only up to 200 bpm. He was continued on amiodarone and quinidine. He subsequently developed fever and there was concern it may be drug-induced and quinidine was stopped. In the interim, he underwent further HF evaluation. His CPX test showed marked functional limitation due to advanced heart failure. His right heart cath shows well-compensated pressures and fairly normal CVP and 2D echo shows mild RV dysfunction with no other valvular abnormalities. The plan was to consider LVAD implant as destination therapy after the Thanksgiving holiday.   He now presents with syncope, found to have recurrent VT and ICD shock. Dr. Graciela Husbands has reviewed his device interrogation and it appears he initially had SVT in the slow VT zone. SVT discriminator, wavelet, was set to monitor; therefore, ATP was delivered which accelerated his rhythm to VT for which he received a shock.    Past Medical History Past Medical History  Diagnosis Date  . CAD (coronary artery disease)     s/p MI 1982;  s/p DES to CFX 2011;  s/p NSTEMI 4/12 in setting of VTach;  cath 7/12:  LM ok, LAD mid 30-40%, Dx's with 40%, mCFX stent patent, prox to mid RCA occluded with R to R and  L to R collats.  Medical Tx was continued  . Bradycardia   . Ventricular tachycardia     s/p AICD;   h/o ICD shock;  Amiodarone Rx. S/P ablation in 2012  . 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 HTN (hypertension)   . HLD (hyperlipidemia)   . Cytopenia   . Hypothyroidism   . Renal insufficiency   . Claudication   . Chronic systolic heart failure   . Ischemic cardiomyopathy     echo 7/12:  EF 25-30%, mild AI, mod MR, mod LAE  . TIA (transient ischemic attack) 2001  . CVD (cerebrovascular disease)     left CEA 2008  . Stroke   . Anemia   . Inappropriate therapy from implantable cardioverter-defibrillator 04/02/2013    ATP for sinus tach  SVT wavelet identified SVT but was no  passive   . Myocardial infarction 1992  . Anginal pain   . Automatic implantable cardioverter-defibrillator in situ     Medtronic Evera device serial number [id number]    . Sleep apnea     denies wearing mask (05/25/2013)  . Arthritis     "a little in my knees" (05/25/2013)  . Melanoma of ear 2013    "near left ear" (05/25/2013)    Past Surgical History Past Surgical History  Procedure Laterality  Date  . Femoral-popliteal bypass graft Right 2011  . Cardiac defibrillator placement  2003; 2005; 05/25/2013    2014: Medtronic Evera device serial number [id number]  . Revascularization / in-situ graft leg    . Renal artery bypass Bilateral 2003  . Back surgery    . Lumbar disc surgery  1974; 2000's  . Knee arthroscopy Bilateral 1990's  . Elbow arthroscopy Right 1990's  . Cholecystectomy  12/15/?2010  . Lipoma excision Left 2013    "near ear" (05/25/2013)  . Heel spur surgery Right 1990's  . Cataract extraction w/ intraocular lens  implant, bilateral Bilateral 2000  . Carotid endarterectomy Left ~ 2007  . Doppler echocardiography  2011  . Tonsillectomy  1946  .  Femoral-popliteal bypass graft Left 05/2002    Hattie Perch 05/06/2002 (05/25/2013)  . Tumor excision Left 1960's    "fatty tumor" (05/25/2013)  . Cardiac electrophysiology study and ablation  2012  . Coronary angioplasty  1992  . Cardiac catheterization      "several" (05/25/2013)  . Coronary angioplasty with stent placement      "last one was 11/2012  (05/25/2013)    Allergies/Intolerances Allergies  Allergen Reactions  . Demerol [Meperidine]     Paralysis. Could only move eyes.  . Meperidine Hcl Other (See Comments)    "CAN'T MOVE" CATATONIC PER PT.  [name] Opium Other (See Comments)    "CAN'T MOVE" CATATONIC PER PT.    Current Home Medications      amiodarone 200 MG tablet  Commonly known as:  PACERONE  Take 2 tablets (400 mg total) by mouth daily.     aspirin 81 MG tablet  Take 81 mg by mouth daily.     atorvastatin 80 MG tablet  Commonly known as:  LIPITOR  Take 80 mg by mouth daily.     CALCIUM-MAGNESIUM-ZINC PO  Take 3 tablets by mouth at bedtime. 1000 mg calcium, 400 mg magnesium, 25 mg zinc     carvedilol 6.25 MG tablet  Commonly known as:  COREG  Take 1 tablet (6.25 mg total) by mouth 2 (two) times daily with a meal.     EYE VITAMINS PO  Take 1 capsule by mouth 2 (two) times daily.     furosemide 20 MG tablet  Commonly known as:  LASIX  Take 20 mg by mouth daily.     levothyroxine 112 MCG tablet  Commonly known as:  SYNTHROID  Take 1 tablet (112 mcg total) by mouth daily.     multivitamin tablet  Take 1 tablet by mouth daily.     nitroGLYCERIN 0.4 MG SL tablet  Commonly known as:  NITROSTAT  Place 0.4 mg under the tongue every 5 (five) minutes as needed for chest pain. For chest pain     Family History Family History  Problem Relation Age of Onset  . Coronary artery disease    . Heart attack Mother   . Coronary artery disease Mother   . Coronary artery disease Father   . Stroke Father      Social History Social History  . Marital Status: Divorced    Social History Main Topics  . Smoking status: Former Smoker -- 0.50 packs/day for 37 years    Types: Cigarettes    Quit date: 11/05/2001  . Smokeless tobacco: Never Used  . Alcohol Use: 0.0 oz/week     Comment: 05/25/2013 "mixed drink couple times/month"  . Drug Use: No   Review of Systems General: No chills, fever, night sweats or  weight changes  Cardiovascular:  No chest pain, edema, orthopnea, palpitations, paroxysmal nocturnal dyspnea Dermatological: No rash, lesions or masses Respiratory: No cough, dyspnea Urologic: No hematuria, dysuria Abdominal: No nausea, vomiting, diarrhea, bright red blood per rectum, melena, or hematemesis Neurologic: No visual changes, weakness, changes in mental status All other systems reviewed and are otherwise negative except as noted above.  Physical Exam Vitals: Blood pressure 101/68, pulse 94, temperature 98.4 F (36.9 C), temperature source Oral, resp. rate 13, SpO2 97.00%.  General: Well developed 72 y.o. male in no acute distress. HEENT: Normocephalic, atraumatic. EOMs intact. Sclera nonicteric. Oropharynx clear.  Neck: Supple without bruits. +JVD. Lungs: Respirations regular and unlabored, CTA bilaterally. No wheezes, rales or rhonchi. Heart: Regular. S1, S2 present. No murmurs, rub, S3 or S4. Abdomen: Soft, non-tender, non-distended. BS present x 4 quadrants. No hepatosplenomegaly.  Extremities: No clubbing, cyanosis or edema. DP/PT/Radials 2+ and equal bilaterally. Psych: Normal affect. Neuro: Alert and oriented X 3. Moves all extremities spontaneously. Musculoskeletal: No kyphosis. Skin: Intact. Warm and dry. No rashes or petechiae in exposed areas.   Labs  Recent Labs  10/12/13 0951  TROPONINI <0.30   Lab Results  Component Value Date   WBC 5.8 10/12/2013   HGB 11.3* 10/12/2013   HCT 34.9* 10/12/2013   MCV 100.3* 10/12/2013   PLT 172 10/12/2013     Recent Labs Lab 10/12/13 0951  NA 138  K 4.8  CL 104  CO2 26  BUN 30*   CREATININE 1.80*  CALCIUM 8.8  PROT 6.3  BILITOT 0.3  ALKPHOS 140*  ALT 31  AST 34  GLUCOSE 109*   No components found with this basename: MAGNESIUM,  Lab Results  Component Value Date   CHOL 118 11/28/2012   HDL 50.80 11/28/2012   LDLCALC 58 11/28/2012   TRIG 48.0 11/28/2012    Recent Labs  10/12/13 0951  INR 1.19    Radiology/Studies Dg Chest 2 View 10/12/2013    IMPRESSION: No active cardiopulmonary disease.    Electronically Signed   By: Natasha Mead M.D.   On: 10/12/2013 11:08   Echocardiogram November 2014 Study Conclusions - Left ventricle: The cavity size was severely dilated. Systolic function was severely reduced. The estimated ejection fraction was in the range of 20% to 25%. Diffuse hypokinesis. Features are consistent with a pseudonormal left ventricular filling pattern, with concomitant abnormal relaxation and increased filling pressure (grade 2 diastolic dysfunction). Doppler parameters are consistent with elevated ventricular end-diastolic filling pressure. - Aortic valve: Mild regurgitation. - Mitral valve: Mild regurgitation. - Left atrium: The atrium was moderately dilated. - Right ventricle: The cavity size was mildly dilated. Wall thickness was normal. Systolic function was mildly reduced. - Right atrium: The atrium was mildly dilated. - Atrial septum: No defect or patent foramen ovale was identified. - Pulmonary arteries: Systolic pressure was moderately to severely increased, estimated to be 60mm Hg. PA peak pressure: 61mm Hg (S). - Impressions: When compared to the prior study from 09/07/2013 RVSP is now elevated and consistent with severe pulmonary hypertension. The left ventricle is still severely dilated with severe systolic dysfunction, LVEF 20-25%. Impression: - When compared to the prior study from 09/07/2013 RVSP is now elevated and consistent with severe pulmonary hypertension. The left ventricle is still severely dilated with  severe systolic dysfunction, LVEF 20-25%.  12-lead ECG shows an atrial tachycardia at 98 bpm Device interrogation performed by industry at bedside; please see printout for full details; this has been reviewed in full by Dr.  Klein   Assessment and Plan 1. Atrial tachycardia 2. Ventricular tachycardia s/p ICD shock 3. Chronic systolic HF 4. Severe ischemic CM, EF 20-25% 5. PVD 6. HTN 7. History of TIA  Dr. Graciela Husbands has reviewed Mr. Grayer's device interrogation in full.  Briefly, he had SVT (an atrial tachycardia conducting 1:1) and his discriminator / wavelet was not enabled so ATP therapy was delivered (as programmed in VT zone) which was pro-arrhythmic, accelerating to VT, symptomatic with syncope and subsequent ICD shock. His SVT discriminator, wavelet, has been turned on and updated. There were no other programming changes made. We recommend he continue current AAD therapy with amiodarone.   Dr. Graciela Husbands to see. Please see recommendations below. Signed, Rick Duff, PA-C 10/12/2013, 3:33 PM

## 2013-10-12 NOTE — ED Notes (Signed)
Cards dr called and asked that pacer be interrogated which i did and transmitted

## 2013-10-12 NOTE — ED Notes (Signed)
Pt states that he was sitting at computer and got dizzy and thinks he passed out.  Pt reports diaphoretic and hands cold.  Pt is to have LVAD placed at some time.  Pt has history of VT and has bivent Pacer.  Pt denies chest pain

## 2013-10-12 NOTE — ED Notes (Signed)
Per Dr. Myrtis Ser, NPO status can be changed to midnight. Pt informed he is allowed to eat.

## 2013-10-12 NOTE — ED Notes (Signed)
Pt states  That he was at  Computer working on bank records and he got dizzy and thinks he passed out for a few mins called his girlfriend and they came to er pt sttaes he lives in [place] and he wanted to come to [facility] to his cards dr ( is to have LVAD placed soon ) pt has pacer/ defib ( [employer]tronics) which is why he did he did not want to go to [facility]

## 2013-10-12 NOTE — ED Notes (Signed)
Dr  Gala Romney here to see and admit pt. Friend in rm

## 2013-10-13 ENCOUNTER — Inpatient Hospital Stay: Payer: Medicare Other | Admitting: Internal Medicine

## 2013-10-13 DIAGNOSIS — I2589 Other forms of chronic ischemic heart disease: Secondary | ICD-10-CM

## 2013-10-13 DIAGNOSIS — I498 Other specified cardiac arrhythmias: Principal | ICD-10-CM

## 2013-10-13 DIAGNOSIS — I5022 Chronic systolic (congestive) heart failure: Secondary | ICD-10-CM

## 2013-10-13 LAB — BASIC METABOLIC PANEL
BUN: 25 mg/dL — ABNORMAL HIGH (ref 6–23)
CO2: 27 mEq/L (ref 19–32)
Chloride: 106 mEq/L (ref 96–112)
GFR calc non Af Amer: 40 mL/min — ABNORMAL LOW (ref >90)
Glucose, Bld: 93 mg/dL (ref 70–99)
Potassium: 4.5 mEq/L (ref 3.5–5.1)
Sodium: 141 mEq/L (ref 135–145)

## 2013-10-13 MED ORDER — SODIUM CHLORIDE 0.9 % IJ SOLN: 3.0000 mL | INTRAMUSCULAR | Status: DC | PRN

## 2013-10-13 MED ORDER — SODIUM CHLORIDE 0.9 % IV SOLN: 250.0000 mL | INTRAVENOUS | Status: DC | PRN

## 2013-10-13 MED ORDER — FUROSEMIDE 10 MG/ML IJ SOLN
40.0000 mg | Freq: Once | INTRAMUSCULAR | Status: AC
  Administered 2013-10-13: 40 mg via INTRAVENOUS

## 2013-10-13 MED ORDER — SODIUM CHLORIDE 0.9 % IJ SOLN
3.0000 mL | Freq: Two times a day (BID) | INTRAMUSCULAR | Status: DC
  Administered 2013-10-13: 3 mL via INTRAVENOUS

## 2013-10-13 NOTE — Progress Notes (Signed)
CARDIAC REHAB PHASE I   PRE:  Rate/Rhythm: 109 afib    BP: sitting 92/60, standing with machine 65/45    Sitting manual 92/60, standing manual 80/60    SaO2:   MODE:  Ambulation: 350 ft   POST:  Rate/Rhythm: 130-144 afib with ?PVCs    BP: sitting 86/50     SaO2: 98 RA  Pt feeling good and sts he walked 350 ft earlier with NT. Took orthostatics, had to use manual cuff to ensure accuracy. Denied dizziness throughout walk. Sts he felt fine. Tired by end of walk. HR 120-130s with 3 burst of increased rate to 144. ? Ectopy or atrial. Pt denied sx. Will f/u tomorrow for 6 min walk test. Pt jovial. Understands LVAD and OHS procedures. [id number]  Elissa Lovett [place] CES, ACSM 10/13/2013 3:09 PM

## 2013-10-13 NOTE — Progress Notes (Signed)
   ELECTROPHYSIOLOGY NOTE    Patient Name: [name] Date of Encounter: 10/13/2013    Asked to attempt to pace terminate atrial flutter through device.  Patient has been in atrial flutter at cycle length of since 10-04-13.  Discussed with Drs Graciela Husbands and Bensimhon.  Pace termination was not carried out today.  Pt remains in 2:1 atrial flutter.  Signed, Marena Chancy, BSN

## 2013-10-13 NOTE — Progress Notes (Signed)
Advanced Heart Failure Rounding Note  Primary EP: Dr. Graciela Husbands  Primary Physician: Dr. [name] ([place], [place])  Reason for Consultation: Heart failure with recurrent VT (syncope)   Subjective:    [name] is a 72 yo man with an ischemic CM s/p ICD implant, chronic systolic HF (EF 20-25%), paroxysmal VT s/p ablation (09/2011) and repeat (06/2013), CAD, PVD (s/p R fem pop BPG 01/2010; s/p L CFA BPG 05/2002), AAA (s/p repair 04/2002), CAS (s/p L CEA 2008), HTN, arthritis, and TIA.   Over the past couple of months has had multiple episodes of Vtach with ICD shocks. He was recently discharged from the hospital 09/29/13 for recurrent ICD shock. He was on quindine which was stopped because of possible quindine induced fever, however then repeat CT showed possible aspiration PNA and he was treated with full course of antibiotics. He completed work-up for LVAD and was seen last week in clinic, which at that time he reported no fevers and feeling well.   Stable overnight. Denies SOB, orthopnea, palpitations or CP. Cr trending down 1.63. Prealbumin pending.   Objective:   Weight Range:  Vital Signs:   Temp:  [97.5 F (36.4 C)-98.6 F (37 C)] 97.6 F (36.4 C) (12/09 0400) Pulse Rate:  [68-100] 100 (12/09 0600) Resp:  [9-22] 16 (12/09 0600) BP: (90-129)/(52-100) 105/59 mmHg (12/09 0600) SpO2:  [94 %-100 %] 94 % (12/09 0600) Weight:  [190 lb 4.1 oz (86.3 kg)] 190 lb 4.1 oz (86.3 kg) (12/09 0500)    Weight change: Filed Weights   10/13/13 0500  Weight: 190 lb 4.1 oz (86.3 kg)    Intake/Output:   Intake/Output Summary (Last 24 hours) at 10/13/13 0748 Last data filed at 10/13/13 0410  Gross per 24 hour  Intake      0 ml  Output    300 ml  Net   -300 ml     Physical Exam: General:  Well appearing. No resp difficulty HEENT: normal Neck: supple. JVP 7 . Carotids 2+ bilat; no bruits. No lymphadenopathy or thryomegaly appreciated. Cor: PMI nondisplaced. Regular rate & irregular rhythm.  No rubs, gallops or murmurs. Lungs: clear Abdomen: soft, nontender, nondistended. No hepatosplenomegaly. No bruits or masses. Good bowel sounds. Extremities: no cyanosis, clubbing, rash, edema Neuro: alert & orientedx3, cranial nerves grossly intact. moves all 4 extremities w/o difficulty. Affect pleasant  Telemetry: Atrial tach 90-100s  Labs: Basic Metabolic Panel:  Recent Labs Lab 10/08/13 1503 10/12/13 0951 10/13/13 0554  NA 138 138 141  K 5.1 4.8 4.5  CL 102 104 106  CO2 28 26 27   GLUCOSE 98 109* 93  BUN 26* 30* 25*  CREATININE 1.54* 1.80* 1.63*  CALCIUM 9.3 8.8 8.7    Liver Function Tests:  Recent Labs Lab 10/08/13 1503 10/12/13 0951  AST 30 34  ALT 32 31  ALKPHOS 140* 140*  BILITOT 0.5 0.3  PROT 7.2 6.3  ALBUMIN 3.5 3.0*   No results found for this basename: LIPASE, AMYLASE,  in the last 168 hours No results found for this basename: AMMONIA,  in the last 168 hours  CBC:  Recent Labs Lab 10/12/13 0951  WBC 5.8  NEUTROABS 4.3  HGB 11.3*  HCT 34.9*  MCV 100.3*  PLT 172    Cardiac Enzymes:  Recent Labs Lab 10/12/13 0951  TROPONINI <0.30    BNP: BNP (last 3 results)  Recent Labs  09/15/13 0424 09/15/13 0750 10/12/13 0951  PROBNP 2704.0* 3038.0* 3404.0*    Imaging: Dg Chest 2  View  10/12/2013   CLINICAL DATA:  Syncope  EXAM: CHEST  2 VIEW  COMPARISON:  09/24/2013  FINDINGS: Cardiomediastinal silhouette is unremarkable. Dual lead cardiac pacemaker with leads in right atrium and right ventricle. No acute infiltrate or pulmonary edema.  IMPRESSION: No active cardiopulmonary disease.   Electronically Signed   By: Natasha Mead M.D.   On: 10/12/2013 11:08      Medications:     Scheduled Medications: . amiodarone  400 mg Oral Daily  . aspirin EC  81 mg Oral Daily  . atorvastatin  80 mg Oral q1800  . carvedilol  6.25 mg Oral BID WC  . enoxaparin (LOVENOX) injection  40 mg Subcutaneous Q24H  . levothyroxine  112 mcg Oral QAC breakfast  .  multivitamin-lutein  1 capsule Oral Daily  . sodium chloride  3 mL Intravenous Q12H     Infusions:     PRN Medications:  sodium chloride, acetaminophen, ondansetron (ZOFRAN) IV, sodium chloride   Assessment:   1) Syncope  2) Recurrent Vtach  - ablation 09/2011 and 06/2013  3) Chronic systolic HF, EF 20-25% (09/2013)  4) Mixed NICM/ICM  5) CAD  6) PAD  Plan/Discussion:    Admitted yesterday d/t syncope and dizziness. ICD interrogated and he had atrial tach 2:1 conduction with subsequent inappropriate ATP which accelerated SVT>>VT with failed ATP and then spontaneous termination. Dr. Graciela Husbands will try to pace out of atrial tach this am, if not will proceed with DC-CV.  Patient stable overnight. CVP 8-9. Will give one dose of 40 mg IV lasix today. Will need RHC before discharge for LVAD optimization. Would like to get him home before LVAD implant on Monday, will contact Anesthesia in order for their team to go ahead and consent patient.  Continue current medications.   Length of Stay: 1   Aundria Rud 10/13/2013, 7:48 AM  Advanced Heart Failure Team Pager [phone] (M-F; 7a - 4p)  Please contact [facility] for night-coverage after hours (4p -7a ) and weekends on [email or web address]  Patient seen and examined with Ulla Potash, NP. We discussed all aspects of the encounter. I agree with the assessment and plan as stated above.   Feeling ok. Volume status up a bit. Will give IV lasix. Watch renal function. Will discuss plan for atrial tach/flutter with EP. Need to decide on anticoagulation and cardioversion.   Plan RHC tomorrow. If numbers ok can go home for readmission on Sunday for VAD Monday 12/15.  Truman Hayward 5:16 PM

## 2013-10-13 NOTE — Clinical Documentation Improvement (Signed)
THIS DOCUMENT IS NOT A PERMANENT PART OF THE MEDICAL RECORD  Please update your documentation with the medical record to reflect your response to this query. If you need help knowing how to do this please call [phone].  10/13/13   Ulla Potash, NP,  In a better effort to capture your patient's severity of illness, reflect appropriate length of stay and utilization of resources, a review of the patient medical record has revealed the following indicators:   - "Chronic Renal insufficiency" documented in current medical record   - GFR  (white male) 30's to 50's since 11/2012.          Based on your clinical judgment, please document in the progress notes and discharge summary of a condition below provides greater specificity regarding the patient's renal function:   - CKD Stage I -  GFR > OR = 90  - CKD Stage II - GFR 60-89  - CKD Stage III - GFR 30-59  - CKD Stage IV - GFR 15-29  - CKD Stage V - GFR < 15  - ESRD (End Stage Renal Disease)  - Other Condition  - Unable to Clinically Determine    Reviewed: 10/16/13 - s3ckd documented in dc summary by Ulla Potash, np.  Mathis Dad   In responding to this query please exercise your independent judgment.    The fact that a query is asked, does not imply that any particular answer is desired or expected.     Thank You,  [name]  RN BSN CCDS Certified Clinical Documentation Specialist: [phone] Health Information Management [place]

## 2013-10-13 NOTE — Progress Notes (Signed)
Cardiothoracic Surgery Progress Note:  The patient is a 72 year old gentleman with ischemic cardiomyopathy and chronic systolic heart failure with an EF of 20-25%, S/P ICD implant and paroxysmal VT ablation x 2 who was admitted last month with recurrent VT and ICD shocks. He has a several month history of progressive exertional dyspnea and fatigue. He was evaluated by me at that time for an LVAD but then spiked a fever and positive BC and was felt to have pneumonia seen on CT of the chest. He has been treated with a full course of antibiotics and followed-up in the [facility] Failure [facility]. He was seen last week and was doing well but now was admitted after a syncopal episode at home while sitting at his desk. When he awoke he was very diaphoretic and called his girlfriend who brought him to the hospital. His last 2D echo on 09/21/2013 showed mild RV dilatation and mild hypokinesis. His last right heart cath on 09/15/2013 showed:  RA = 4  RV = 42/4/5  PA = 44/22 (31)  PCW = 19 v = 24  Fick cardiac output/index = 5.45/2.6  Thermo CO/CI = 3.8/1.8  PVR = 2.2 WU (Fick)  FA sat = 92%  PA sat = 60%, 60%  SVC = 61%  Assessment:  1. Well compensated filling pressures  2. Normal cardiac output by Fick but depressed cardiac output by thermodilution  3. No evidence of intracardiac shunt    His last lower extremity dopplers in May 2014 showed patient bilateral fem-pop bypasses.  PFT's on 09/18/2013 showed a mild reduction in FEV1 of 2.92 and a marked reduction in diffusion capacity to 36% of predicted.   His last cardiac cath on 11/23/2012 shows 40% distal LM and mild LAD/LCX disease with a patent stent in the LCX. There is 95% long segment proximal RCA stenosis with left to right collaterals filling the distal vessel.   Subjective:  He feels fine today at rest.  Objective: Vital signs in last 24 hours: Temp:  [97.5 F (36.4 C)-98.6 F (37 C)] 97.6 F (36.4 C) (12/09 1201) Pulse Rate:   [68-102] 86 (12/09 1201) Cardiac Rhythm:  [-] Sinus tachycardia (12/09 0941) Resp:  [10-23] 11 (12/09 1201) BP: (100-134)/(59-101) 107/78 mmHg (12/09 1201) SpO2:  [94 %-100 %] 98 % (12/09 1201) Weight:  [86.3 kg (190 lb 4.1 oz)] 86.3 kg (190 lb 4.1 oz) (12/09 0500)  Hemodynamic parameters for last 24 hours:    Intake/Output from previous day: 12/08 0701 - 12/09 0700 In: -  Out: 300 [Urine:300] Intake/Output this shift:    General appearance: alert and cooperative Neurologic: intact Heart: regular rate and rhythm, S1, S2 normal, no murmur, click, rub or gallop Lungs: clear to auscultation bilaterally Abdomen: soft, non-tender; bowel sounds normal; no masses,  no organomegaly Extremities: extremities normal, atraumatic, no cyanosis or edema  Lab Results:  Recent Labs  10/12/13 0951  WBC 5.8  HGB 11.3*  HCT 34.9*  PLT 172   BMET:  Recent Labs  10/12/13 0951 10/13/13 0554  NA 138 141  K 4.8 4.5  CL 104 106  CO2 26 27  GLUCOSE 109* 93  BUN 30* 25*  CREATININE 1.80* 1.63*  CALCIUM 8.8 8.7    PT/INR:  Recent Labs  10/12/13 0951  LABPROT 14.8  INR 1.19   ABG    Component Value Date/Time   PHART 7.522* 09/18/2013 1551   HCO3 23.6 09/18/2013 1551   TCO2 24 09/18/2013 1551   ACIDBASEDEF  3.0* 09/17/2013 0929   O2SAT 96.0 09/18/2013 1551   CBG (last 3)  No results found for this basename: GLUCAP,  in the last 72 hours  Assessment/Plan:  He has chronic systolic heart failure with class 3B to 4 symptoms with acute decompensations that seem to be due to arrhythmias. He is a vasculopath but has remained very active until the past few months when he has been limited by exertional dyspnea and fatigue. Insertion of a HM II LVAD for DT is really the only option to try to improve his functional state and prognosis. He was discussed at the [facility] meeting and felt to be an acceptable candidate with a great attitude and support system. His operative risk is certainly  increased due to his diffuse vascular disease. I discussed the operative procedure with the patient  including alternatives, benefits and risks; including but not limited to bleeding, blood transfusion, infection, stroke, myocardial infarction, pump thrombosis or failure,  organ dysfunction, and death.  [name] understands and agrees to proceed. He will have a right heart cath tomorrow and hopefully go home to return for surgery Monday.  LOS: 1 day    [name] K 10/13/2013

## 2013-10-13 NOTE — Progress Notes (Signed)
Pharmacist Heart Failure Core Measure Documentation  Assessment: [name] has an EF documented as 20-25% on 09/21/13 by ECHO.  Rationale: Heart failure patients with left ventricular systolic dysfunction (LVSD) and an EF < 40% should be prescribed an angiotensin converting enzyme inhibitor (ACEI) or angiotensin receptor blocker (ARB) at discharge unless a contraindication is documented in the medical record.  This patient is not currently on an ACEI or ARB for HF.  This note is being placed in the record in order to provide documentation that a contraindication to the use of these agents is present for this encounter.  ACE Inhibitor or Angiotensin Receptor Blocker is contraindicated (specify all that apply)  []   ACEI allergy AND ARB allergy []   Angioedema []   Moderate or severe aortic stenosis []   Hyperkalemia []   Hypotension []   Renal artery stenosis [x]   Worsening renal function, preexisting renal disease or dysfunction   Plans: Will f/u for initiation of ACE in the future as appropriate.  Tad Moore, BCPS  Clinical Pharmacist Pager [phone]  10/13/2013 2:30 PM

## 2013-10-13 NOTE — Care Management Note (Addendum)
    Page 1 of 1   10/15/2013     3:55:44 PM   CARE MANAGEMENT NOTE 10/15/2013  Patient:  [name], [name]   Account Number:  [id number]  Date Initiated:  10/13/2013  Documentation initiated by:  Junius Creamer  Subjective/Objective Assessment:   adm [name] syncope     Action/Plan:   lives [name] fam, pcp dr [name]   Anticipated DC Date:  10/15/2013   Anticipated DC Plan:  HOME [name]      DC Planning Services  CM consult      [facility] Choice  Resumption Of Svcs/PTA Provider   Choice offered to / List presented to:          [facility] arranged  HH-1 RN  HH-10 DISEASE MANAGEMENT      HH agency  [facility]   Status of service:   Medicare Important Message given?   (If response is "NO", the following Medicare IM given date fields will be blank) Date Medicare IM given:   Date Additional Medicare IM given:    Discharge Disposition:  HOME [name]  Per UR Regulation:  Reviewed for med. necessity/level of care/duration of stay  If discussed at Long Length of Stay Meetings, dates discussed:    Comments:  12/11 1512 Frank Jeet Shough rn,bsn pt for disch home. have alerted [name] gentiva of disch. pt to go home [name] lovneox. nse teaching pt and fam how to adm. pt for adm on sund 12-14 for lvad surgery on 12-15. spoke [name] blue medicare, generic lovenox is tier 2 drug and does not need prior auth. pt's nse to alert pt of this.

## 2013-10-14 ENCOUNTER — Encounter (HOSPITAL_COMMUNITY)
Admission: EM | Disposition: A | Discharge status: Home / Self Care | Payer: Self-pay | Source: Home / Self Care | Attending: Internal Medicine

## 2013-10-14 DIAGNOSIS — I509 Heart failure, unspecified: Secondary | ICD-10-CM

## 2013-10-14 HISTORY — PX: RIGHT HEART CATHETERIZATION: SHX5447

## 2013-10-14 LAB — POCT I-STAT 3, VENOUS BLOOD GAS (G3P V)
Acid-base deficit: 1 mmol/L (ref 0.0–2.0)
Bicarbonate: 25 mEq/L — ABNORMAL HIGH (ref 20.0–24.0)
Bicarbonate: 25.9 mEq/L — ABNORMAL HIGH (ref 20.0–24.0)
O2 Saturation: 55 %
O2 Saturation: 54 %
TCO2: 26 mmol/L (ref 0–100)
TCO2: 27 mmol/L (ref 0–100)
pCO2, Ven: 45.3 mmHg (ref 45.0–50.0)
pCO2, Ven: 48.2 mmHg (ref 45.0–50.0)
pH, Ven: 7.349 — ABNORMAL HIGH (ref 7.250–7.300)
pO2, Ven: 31 mmHg (ref 30.0–45.0)
pO2, Ven: 31 mmHg (ref 30.0–45.0)

## 2013-10-14 LAB — BASIC METABOLIC PANEL
BUN: 25 mg/dL — ABNORMAL HIGH (ref 6–23)
Chloride: 103 mEq/L (ref 96–112)
GFR calc Af Amer: 47 mL/min — ABNORMAL LOW (ref >90)
GFR calc non Af Amer: 40 mL/min — ABNORMAL LOW (ref >90)
Potassium: 4.2 mEq/L (ref 3.5–5.1)
Sodium: 138 mEq/L (ref 135–145)

## 2013-10-14 LAB — BLOOD GAS, ARTERIAL
Acid-Base Excess: 2.9 mmol/L — ABNORMAL HIGH (ref 0.0–2.0)
Bicarbonate: 26.7 mEq/L — ABNORMAL HIGH (ref 20.0–24.0)
Drawn by: 331761
FIO2: 0.21 %
O2 Saturation: 94 %
Patient temperature: 98.6
TCO2: 27.9 mmol/L (ref 0–100)
pCO2 arterial: 39.3 mmHg (ref 35.0–45.0)
pH, Arterial: 7.447 (ref 7.350–7.450)
pO2, Arterial: 69.2 mmHg — ABNORMAL LOW (ref 80.0–100.0)

## 2013-10-14 LAB — T4, FREE: Free T4: 1.28 ng/dL (ref 0.80–1.80)

## 2013-10-14 LAB — CBC
Hemoglobin: 11.8 g/dL — ABNORMAL LOW (ref 13.0–17.0)
MCH: 32 pg (ref 26.0–34.0)
MCHC: 32.2 g/dL (ref 30.0–36.0)
MCV: 99.5 fL (ref 78.0–100.0)
RDW: 15.7 % — ABNORMAL HIGH (ref 11.5–15.5)

## 2013-10-14 LAB — URINE CULTURE: Special Requests: NORMAL

## 2013-10-14 LAB — PROTIME-INR
INR: 1.17 (ref 0.00–1.49)
Prothrombin Time: 14.7 seconds (ref 11.6–15.2)

## 2013-10-14 LAB — PREALBUMIN: Prealbumin: 17.9 mg/dL — ABNORMAL LOW (ref 17.0–34.0)

## 2013-10-14 LAB — TSH: TSH: 6.86 u[IU]/mL — ABNORMAL HIGH (ref 0.350–4.500)

## 2013-10-14 SURGERY — RIGHT HEART CATH
Anesthesia: LOCAL

## 2013-10-14 MED ORDER — HEPARIN (PORCINE) IN NACL 100-0.45 UNIT/ML-% IJ SOLN
1200.0000 [IU]/h | INTRAMUSCULAR | Status: AC
  Administered 2013-10-14: 1200 [IU]/h via INTRAVENOUS
  Filled 2013-10-14 (×3): qty 250

## 2013-10-14 MED ORDER — FENTANYL CITRATE 0.05 MG/ML IJ SOLN
INTRAMUSCULAR | Status: AC
  Filled 2013-10-14: qty 2

## 2013-10-14 MED ORDER — LIDOCAINE HCL (PF) 1 % IJ SOLN
INTRAMUSCULAR | Status: AC
  Filled 2013-10-14: qty 30

## 2013-10-14 MED ORDER — ACETAMINOPHEN 325 MG PO TABS: 650.0000 mg | ORAL_TABLET | ORAL | Status: DC | PRN

## 2013-10-14 MED ORDER — SODIUM CHLORIDE 0.9 % IV SOLN
250.0000 mL | INTRAVENOUS | Status: DC
  Administered 2013-10-14: 250 mL via INTRAVENOUS

## 2013-10-14 MED ORDER — ONDANSETRON HCL 4 MG/2ML IJ SOLN: 4.0000 mg | Freq: Four times a day (QID) | INTRAMUSCULAR | Status: DC | PRN

## 2013-10-14 MED ORDER — HEPARIN (PORCINE) IN NACL 2-0.9 UNIT/ML-% IJ SOLN
INTRAMUSCULAR | Status: AC
  Filled 2013-10-14: qty 1000

## 2013-10-14 MED ORDER — SODIUM CHLORIDE 0.9 % IJ SOLN: 3.0000 mL | Freq: Two times a day (BID) | INTRAMUSCULAR | Status: DC

## 2013-10-14 MED ORDER — SODIUM CHLORIDE 0.9 % IJ SOLN: 3.0000 mL | INTRAMUSCULAR | Status: DC | PRN

## 2013-10-14 MED ORDER — MIDAZOLAM HCL 2 MG/2ML IJ SOLN
INTRAMUSCULAR | Status: AC
  Filled 2013-10-14: qty 2

## 2013-10-14 NOTE — Consult Note (Signed)
ANTICOAGULATION CONSULT NOTE - Initial Consult  Pharmacy Consult for Heparin Indication: Aflutter  Allergies  Allergen Reactions  . Demerol [Meperidine]     Paralysis. Could only move eyes.  . Meperidine Hcl Other (See Comments)    "CAN'T MOVE" CATATONIC PER PT.  [name] Opium Other (See Comments)    "CAN'T MOVE" CATATONIC PER PT.    Patient Measurements: Height: 6' (182.9 cm) Weight: 185 lb 13.6 oz (84.3 kg) IBW/kg (Calculated) : 77.6 Heparin Dosing Weight: 84kg  Vital Signs: Temp: 97.9 F (36.6 C) (12/10 0400) Temp src: Oral (12/10 0400) BP: 105/52 mmHg (12/10 0800) Pulse Rate: 97 (12/10 0800)  Labs:  Recent Labs  10/12/13 0951 10/13/13 0554 10/14/13 0341  HGB 11.3*  --  11.8*  HCT 34.9*  --  36.7*  PLT 172  --  168  LABPROT 14.8  --  14.7  INR 1.19  --  1.17  CREATININE 1.80* 1.63* 1.62*  TROPONINI <0.30  --   --     Estimated Creatinine Clearance: 44.6 ml/min (by C-G formula based on Cr of 1.62).   Medical History: Past Medical History  Diagnosis Date  . CAD (coronary artery disease)     s/p MI 1982;  s/p DES to CFX 2011;  s/p NSTEMI 4/12 in setting of VTach;  cath 7/12:  LM ok, LAD mid 30-40%, Dx's with 40%, mCFX stent patent, prox to mid RCA occluded with R to R and L to R collats.  Medical Tx was continued  . Bradycardia   . Ventricular tachycardia     s/p AICD;   h/o ICD shock;  Amiodarone Rx. S/P ablation in 2012  . 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 HTN (hypertension)   . HLD (hyperlipidemia)   . Cytopenia   . Hypothyroidism   . Renal insufficiency   . Claudication   . Chronic systolic heart failure   . Ischemic cardiomyopathy     echo 7/12:  EF 25-30%, mild AI, mod MR, mod LAE  . TIA (transient ischemic attack) 2001  . CVD (cerebrovascular disease)     left CEA 2008  . Stroke   . Anemia   . Inappropriate  therapy from implantable cardioverter-defibrillator 04/02/2013    ATP for sinus tach  SVT wavelet identified SVT but was no  passive   . Myocardial infarction 1992  . Anginal pain   . Automatic implantable cardioverter-defibrillator in situ     [employer]onic Evera device serial number [id number]    . Sleep apnea     denies wearing mask (05/25/2013)  . Arthritis     "a little in my knees" (05/25/2013)  . Melanoma of ear 2013    "near left ear" (05/25/2013)   Assessment: 71yom undergoing LVAD workup was admitted 12/8 with syncope/diaphoresis. His ICD was interrogated and showed Vtach. He is currently in atrial tach/flutter with rates in the 200s. He will begin IV heparin with plans for TEE/DCCV tomorrow and then LVAD on Monday.  He is s/p RHC and heparin to begin 6 hours post sheath removal. Sheath removed at 0900. He also has some renal insufficiency with sCr 1.6 and CrCl ~ 7ml/min.  Goal of Therapy:  Heparin level 0.3-0.7 units/ml Monitor platelets by anticoagulation protocol: Yes   Plan:  1) At 1500 today, begin heparin at  1200 units/hr with NO bolus 2) 8 hour heparin level 3) Daily heparin level and CBC  Fredrik Rigger 10/14/2013,9:26 AM

## 2013-10-14 NOTE — Progress Notes (Signed)
ANTICOAGULATION CONSULT NOTE - Follow Up Consult  Pharmacy Consult for heparin Indication: Aflutter  Labs:  Recent Labs  10/12/13 0951 10/13/13 0554 10/14/13 0341 10/14/13 2302  HGB 11.3*  --  11.8*  --   HCT 34.9*  --  36.7*  --   PLT 172  --  168  --   LABPROT 14.8  --  14.7  --   INR 1.19  --  1.17  --   HEPARINUNFRC  --   --   --  0.36  CREATININE 1.80* 1.63* 1.62*  --   TROPONINI <0.30  --   --   --     Assessment/Plan:  71yo male therapeutic on heparin with initial dosing for Aflutter, awaiting TEE/DCCV and LVAD.  Will continue gtt at current rate and confirm stable with am labs.  Vernard Gambles, PharmD, BCPS  10/14/2013,11:38 PM

## 2013-10-14 NOTE — Clinical Documentation Improvement (Signed)
THIS DOCUMENT IS NOT A PERMANENT PART OF THE MEDICAL RECORD  Please update your documentation with the medical record to reflect your response to this query. If you need help knowing how to do this please call [phone].  10/14/13  Ulla Potash,  In a better effort to capture your patient's severity of illness, reflect appropriate length of stay and utilization of resources, a review of the patient medical record has revealed the following information:   - Chronic Systolic Heart Failure documented in the current medical record   - Lasix IV given x 1 on 10/13/13 for "volume status up a bit"    Based on your clinical judgment, please document in the progress notes and discharge summary if a condition below provides greater specificity regarding the patient's heart failure:   - Acute on Chronic Systolic Heart Failure   - Other Condition   - Unable to Clinically Determine   In responding to this query please exercise your independent judgment.    The fact that a query is asked, does not imply that any particular answer is desired or expected.   Reviewed: 10/15/13 - disagreed with a on c shf.  Mathis Dad RN  Thank You,  [name]  RN BSN CCDS Certified Clinical Documentation Specialist: [phone] Health Information Management [place]

## 2013-10-14 NOTE — Progress Notes (Signed)
CARDIAC REHAB PHASE I   PRE:  Rate/Rhythm: 32 Sr with PAC/PVC    BP: sitting 100/62, standing 80/60, 90/60    SaO2: 97 RA  MODE:  Ambulation: 600 ft (6 min walk test)  POST:  Rate/Rhythm: 150's afib    BP: sitting 100/60     SaO2: 98 RA  Upon arrival to room pt in NSR with burst of wider complex, 3-5 beats at a time. Getting EKG. Pt able to walk 600 ft for 6 min walk test. No rests needed, tired toward end. Pt HR increased upon standing and reverted back to what appears to be Afib. Rate increased gradually throughout walk until he was hitting 150s by the end. Pt C/o some SOB and fatigue. Return to recliner. HR decreased and eventually resumed SR with PACs/PVCs. RN aware.  [id number]  Elissa Lovett [facility] CES, ACSM 10/14/2013 2:21 PM

## 2013-10-14 NOTE — H&P (View-Only) (Signed)
Advanced Heart Failure Rounding Note  Primary EP: Dr. Graciela Husbands  Primary Physician: Dr. [name] ([place], [place])  Reason for Consultation: Heart failure with recurrent VT (syncope)   Subjective:    [name] is a 72 yo man with an ischemic CM s/p ICD implant, chronic systolic HF (EF 20-25%), paroxysmal VT s/p ablation (09/2011) and repeat (06/2013), CAD, PVD (s/p R fem pop BPG 01/2010; s/p L CFA BPG 05/2002), AAA (s/p repair 04/2002), CAS (s/p L CEA 2008), HTN, arthritis, and TIA.   Over the past couple of months has had multiple episodes of Vtach with ICD shocks. He was recently discharged from the hospital 09/29/13 for recurrent ICD shock. He was on quindine which was stopped because of possible quindine induced fever, however then repeat CT showed possible aspiration PNA and he was treated with full course of antibiotics. He completed work-up for LVAD and was seen last week in clinic, which at that time he reported no fevers and feeling well.   Stable overnight. Decision to not pace patient out of Atrial tach yesterday. Denies SOB, orthopnea, palpitations or CP. Cr stable. Prealbumin 19.1. UA clean, however urine cx coag negative staph.    Objective:   Weight Range:  Vital Signs:   Temp:  [97.6 F (36.4 C)-98.5 F (36.9 C)] 97.9 F (36.6 C) (12/10 0400) Pulse Rate:  [65-102] 65 (12/09 1624) Resp:  [11-18] 17 (12/09 1624) BP: (86-134)/(60-101) 86/63 mmHg (12/10 0400) SpO2:  [95 %-99 %] 97 % (12/10 0400) Weight:  [185 lb 13.6 oz (84.3 kg)] 185 lb 13.6 oz (84.3 kg) (12/10 0500) Last BM Date: 10/12/13  Weight change: Filed Weights   10/13/13 0500 10/14/13 0500  Weight: 190 lb 4.1 oz (86.3 kg) 185 lb 13.6 oz (84.3 kg)    Intake/Output:   Intake/Output Summary (Last 24 hours) at 10/14/13 0748 Last data filed at 10/13/13 2200  Gross per 24 hour  Intake    240 ml  Output    550 ml  Net   -310 ml     Physical Exam: General:  Well appearing. No resp difficulty HEENT:  normal Neck: supple. JVP 7 . Carotids 2+ bilat; no bruits. No lymphadenopathy or thryomegaly appreciated. Cor: PMI nondisplaced. Regular rate & irregular rhythm. No rubs, gallops or murmurs. Lungs: clear Abdomen: soft, nontender, nondistended. No hepatosplenomegaly. No bruits or masses. Good bowel sounds. Extremities: no cyanosis, clubbing, rash, edema Neuro: alert & orientedx3, cranial nerves grossly intact. moves all 4 extremities w/o difficulty. Affect pleasant  Telemetry: Atrial tach 90-100s  Labs: Basic Metabolic Panel:  Recent Labs Lab 10/08/13 1503 10/12/13 0951 10/13/13 0554 10/14/13 0341  NA 138 138 141 138  K 5.1 4.8 4.5 4.2  CL 102 104 106 103  CO2 28 26 27 27   GLUCOSE 98 109* 93 100*  BUN 26* 30* 25* 25*  CREATININE 1.54* 1.80* 1.63* 1.62*  CALCIUM 9.3 8.8 8.7 8.6    Liver Function Tests:  Recent Labs Lab 10/08/13 1503 10/12/13 0951  AST 30 34  ALT 32 31  ALKPHOS 140* 140*  BILITOT 0.5 0.3  PROT 7.2 6.3  ALBUMIN 3.5 3.0*   No results found for this basename: LIPASE, AMYLASE,  in the last 168 hours No results found for this basename: AMMONIA,  in the last 168 hours  CBC:  Recent Labs Lab 10/12/13 0951 10/14/13 0341  WBC 5.8 5.3  NEUTROABS 4.3  --   HGB 11.3* 11.8*  HCT 34.9* 36.7*  MCV 100.3* 99.5  PLT 172  168    Cardiac Enzymes:  Recent Labs Lab 10/12/13 0951  TROPONINI <0.30    BNP: BNP (last 3 results)  Recent Labs  09/15/13 0424 09/15/13 0750 10/12/13 0951  PROBNP 2704.0* 3038.0* 3404.0*    Imaging: Dg Chest 2 View  10/12/2013   CLINICAL DATA:  Syncope  EXAM: CHEST  2 VIEW  COMPARISON:  09/24/2013  FINDINGS: Cardiomediastinal silhouette is unremarkable. Dual lead cardiac pacemaker with leads in right atrium and right ventricle. No acute infiltrate or pulmonary edema.  IMPRESSION: No active cardiopulmonary disease.   Electronically Signed   By: Natasha Mead M.D.   On: 10/12/2013 11:08     Medications:     Scheduled  Medications: . amiodarone  400 mg Oral Daily  . aspirin EC  81 mg Oral Daily  . atorvastatin  80 mg Oral q1800  . carvedilol  6.25 mg Oral BID WC  . enoxaparin (LOVENOX) injection  40 mg Subcutaneous Q24H  . levothyroxine  112 mcg Oral QAC breakfast  . multivitamin-lutein  1 capsule Oral Daily  . sodium chloride  3 mL Intravenous Q12H  . sodium chloride  3 mL Intravenous Q12H    Infusions:    PRN Medications: sodium chloride, sodium chloride, acetaminophen, ondansetron (ZOFRAN) IV, sodium chloride, sodium chloride   Assessment:   1) Syncope  2) Recurrent Vtach  - ablation 09/2011 and 06/2013  3) Chronic systolic HF, EF 20-25% (09/2013)  4) Mixed NICM/ICM  5) CAD  6) PAD  Plan/Discussion:    Stable overnight. He will go for RHC today and if he is hemodynamically optimized will hopefully go home tomorrow with LVAD placement on Monday. We will admit Sunday for prep. Anesthesia to see today.  UA clean. Urine cx +coag - staph, likely a contaminate per discussion with ID pharmacist. Will not treat at this time.   Continue current medications.   Length of Stay: 2   Aundria Rud NP-C 10/14/2013, 7:48 AM  Advanced Heart Failure Team Pager [phone] (M-F; 7a - 4p)  Please contact [facility] for night-coverage after hours (4p -7a ) and weekends on [email or web address]   Patient seen and examined with Ulla Potash, NP. We discussed all aspects of the encounter. I agree with the assessment and plan as stated above. Doing well. Remains in A tach/FL. Renal function stable. For RHC this am.  Agree UCx is contaminant with CNS. For VAD Monday. May be able to go home prior to VAD if RHC numbers acceptable.   Shamicka Inga,MD 8:19 AM

## 2013-10-14 NOTE — CV Procedure (Signed)
Cardiac Cath Procedure Note:  Indication:  HF  Procedures performed:  1) Right heart catheterization  Description of procedure:   The risks and indication of the procedure were explained. Consent was signed and placed on the chart. An appropriate timeout was taken prior to the procedure. The right neck was prepped and draped in the routine sterile fashion and anesthetized with 1% local lidocaine.   A 7 FR venous sheath was placed in the right internal jugular vein using a modified Seldinger technique. A standard Swan-Ganz catheter was used for the procedure.   Complications: None apparent.  Findings:  RA = 3 RV = 29/2/5 PA = 31/16 (22) PCW = 13 Fick cardiac output/index = 4.4/2.1 PVR = 2.0 WU FA sat = 94% PA sat = 54%, 55%  Assessment:  Well optimized hemodynamics. Proceed with VAD next week.    Suhaila Troiano 9:04 AM

## 2013-10-14 NOTE — Interval H&P Note (Signed)
History and Physical Interval Note:  10/14/2013 9:02 AM  [name]  has presented today for surgery, with the diagnosis of hf  The various methods of treatment have been discussed with the patient and family. After consideration of risks, benefits and other options for treatment, the patient has consented to  Procedure(s): RIGHT HEART CATH (N/A) as a surgical intervention .  The patient's history has been reviewed, patient examined, no change in status, stable for surgery.  I have reviewed the patient's chart and labs.  Questions were answered to the patient's satisfaction.     Daniel Bensimhon

## 2013-10-14 NOTE — ED Provider Notes (Signed)
Medical screening examination/treatment/procedure(s) were performed by non-physician practitioner and as supervising physician I was immediately available for consultation/collaboration.  EKG Interpretation    Date/Time:  Monday October 12 2013 09:05:21 EST Ventricular Rate:  98 PR Interval:    QRS Duration: 118 QT Interval:  414 QTC Calculation: 528 R Axis:   47 Text Interpretation:  Accelerated Junctional rhythm Inferior infarct , age undetermined ST \\T \ T wave abnormality, consider lateral ischemia Prolonged QT Abnormal ECG Confirmed by [facility]  MD, Manveer Gomes ([id number]) on 10/14/2013 6:35:18 AM              Frank Gales, MD 10/14/13 [phone]

## 2013-10-14 NOTE — Progress Notes (Signed)
Advanced Heart Failure Rounding Note  Primary EP: Dr. Klein  Primary Physician: Dr. Walter Wray ([place], [place])  Reason for Consultation: Heart failure with recurrent VT (syncope)   Subjective:    [name] is a 71 yo man with an ischemic CM s/p ICD implant, chronic systolic HF (EF 20-25%), paroxysmal VT s/p ablation (09/2011) and repeat (06/2013), CAD, PVD (s/p R fem pop BPG 01/2010; s/p L CFA BPG 05/2002), AAA (s/p repair 04/2002), CAS (s/p L CEA 2008), HTN, arthritis, and TIA.   Over the past couple of months has had multiple episodes of Vtach with ICD shocks. He was recently discharged from the hospital 09/29/13 for recurrent ICD shock. He was on quindine which was stopped because of possible quindine induced fever, however then repeat CT showed possible aspiration PNA and he was treated with full course of antibiotics. He completed work-up for LVAD and was seen last week in clinic, which at that time he reported no fevers and feeling well.   Stable overnight. Decision to not pace patient out of Atrial tach yesterday. Denies SOB, orthopnea, palpitations or CP. Cr stable. Prealbumin 19.1. UA clean, however urine cx coag negative staph.    Objective:   Weight Range:  Vital Signs:   Temp:  [97.6 F (36.4 C)-98.5 F (36.9 C)] 97.9 F (36.6 C) (12/10 0400) Pulse Rate:  [65-102] 65 (12/09 1624) Resp:  [11-18] 17 (12/09 1624) BP: (86-134)/(60-101) 86/63 mmHg (12/10 0400) SpO2:  [95 %-99 %] 97 % (12/10 0400) Weight:  [185 lb 13.6 oz (84.3 kg)] 185 lb 13.6 oz (84.3 kg) (12/10 0500) Last BM Date: 10/12/13  Weight change: Filed Weights   10/13/13 0500 10/14/13 0500  Weight: 190 lb 4.1 oz (86.3 kg) 185 lb 13.6 oz (84.3 kg)    Intake/Output:   Intake/Output Summary (Last 24 hours) at 10/14/13 0748 Last data filed at 10/13/13 2200  Gross per 24 hour  Intake    240 ml  Output    550 ml  Net   -310 ml     Physical Exam: General:  Well appearing. No resp difficulty HEENT:  normal Neck: supple. JVP 7 . Carotids 2+ bilat; no bruits. No lymphadenopathy or thryomegaly appreciated. Cor: PMI nondisplaced. Regular rate & irregular rhythm. No rubs, gallops or murmurs. Lungs: clear Abdomen: soft, nontender, nondistended. No hepatosplenomegaly. No bruits or masses. Good bowel sounds. Extremities: no cyanosis, clubbing, rash, edema Neuro: alert & orientedx3, cranial nerves grossly intact. moves all 4 extremities w/o difficulty. Affect pleasant  Telemetry: Atrial tach 90-100s  Labs: Basic Metabolic Panel:  Recent Labs Lab 10/08/13 1503 10/12/13 0951 10/13/13 0554 10/14/13 0341  NA 138 138 141 138  K 5.1 4.8 4.5 4.2  CL 102 104 106 103  CO2 28 26 27 27  GLUCOSE 98 109* 93 100*  BUN 26* 30* 25* 25*  CREATININE 1.54* 1.80* 1.63* 1.62*  CALCIUM 9.3 8.8 8.7 8.6    Liver Function Tests:  Recent Labs Lab 10/08/13 1503 10/12/13 0951  AST 30 34  ALT 32 31  ALKPHOS 140* 140*  BILITOT 0.5 0.3  PROT 7.2 6.3  ALBUMIN 3.5 3.0*   No results found for this basename: LIPASE, AMYLASE,  in the last 168 hours No results found for this basename: AMMONIA,  in the last 168 hours  CBC:  Recent Labs Lab 10/12/13 0951 10/14/13 0341  WBC 5.8 5.3  NEUTROABS 4.3  --   HGB 11.3* 11.8*  HCT 34.9* 36.7*  MCV 100.3* 99.5  PLT 172   168    Cardiac Enzymes:  Recent Labs Lab 10/12/13 0951  TROPONINI <0.30    BNP: BNP (last 3 results)  Recent Labs  09/15/13 0424 09/15/13 0750 10/12/13 0951  PROBNP 2704.0* 3038.0* 3404.0*    Imaging: Dg Chest 2 View  10/12/2013   CLINICAL DATA:  Syncope  EXAM: CHEST  2 VIEW  COMPARISON:  09/24/2013  FINDINGS: Cardiomediastinal silhouette is unremarkable. Dual lead cardiac pacemaker with leads in right atrium and right ventricle. No acute infiltrate or pulmonary edema.  IMPRESSION: No active cardiopulmonary disease.   Electronically Signed   By: Liviu  Pop M.D.   On: 10/12/2013 11:08     Medications:     Scheduled  Medications: . amiodarone  400 mg Oral Daily  . aspirin EC  81 mg Oral Daily  . atorvastatin  80 mg Oral q1800  . carvedilol  6.25 mg Oral BID WC  . enoxaparin (LOVENOX) injection  40 mg Subcutaneous Q24H  . levothyroxine  112 mcg Oral QAC breakfast  . multivitamin-lutein  1 capsule Oral Daily  . sodium chloride  3 mL Intravenous Q12H  . sodium chloride  3 mL Intravenous Q12H    Infusions:    PRN Medications: sodium chloride, sodium chloride, acetaminophen, ondansetron (ZOFRAN) IV, sodium chloride, sodium chloride   Assessment:   1) Syncope  2) Recurrent Vtach  - ablation 09/2011 and 06/2013  3) Chronic systolic HF, EF 20-25% (09/2013)  4) Mixed NICM/ICM  5) CAD  6) PAD  Plan/Discussion:    Stable overnight. He will go for RHC today and if he is hemodynamically optimized will hopefully go home tomorrow with LVAD placement on Monday. We will admit Sunday for prep. Anesthesia to see today.  UA clean. Urine cx +coag - staph, likely a contaminate per discussion with ID pharmacist. Will not treat at this time.   Continue current medications.   Length of Stay: 2   Cosgrove, Ali B NP-C 10/14/2013, 7:48 AM  Advanced Heart Failure Team Pager [phone] (M-F; 7a - 4p)  Please contact [facility] for night-coverage after hours (4p -7a ) and weekends on [email or web address]   Patient seen and examined with Ali Cosgrove, NP. We discussed all aspects of the encounter. I agree with the assessment and plan as stated above. Doing well. Remains in A tach/FL. Renal function stable. For RHC this am.  Agree UCx is contaminant with CNS. For VAD Monday. May be able to go home prior to VAD if RHC numbers acceptable.   Meiko Ives,MD 8:19 AM   

## 2013-10-15 ENCOUNTER — Inpatient Hospital Stay: Payer: Medicare Other | Admitting: Infectious Disease

## 2013-10-15 ENCOUNTER — Inpatient Hospital Stay (HOSPITAL_COMMUNITY): Payer: Medicare Other

## 2013-10-15 ENCOUNTER — Inpatient Hospital Stay (HOSPITAL_COMMUNITY): Payer: Medicare Other | Admitting: Certified Registered Nurse Anesthetist

## 2013-10-15 ENCOUNTER — Encounter (HOSPITAL_COMMUNITY): Payer: Medicare Other | Admitting: Certified Registered Nurse Anesthetist

## 2013-10-15 ENCOUNTER — Encounter (HOSPITAL_COMMUNITY)
Admission: EM | Disposition: A | Discharge status: Home / Self Care | Payer: Self-pay | Source: Home / Self Care | Attending: Internal Medicine

## 2013-10-15 DIAGNOSIS — I4892 Unspecified atrial flutter: Secondary | ICD-10-CM

## 2013-10-15 HISTORY — PX: CARDIOVERSION: SHX1299

## 2013-10-15 LAB — CBC
Hemoglobin: 11.2 g/dL — ABNORMAL LOW (ref 13.0–17.0)
MCH: 32.3 pg (ref 26.0–34.0)
MCHC: 32.5 g/dL (ref 30.0–36.0)
MCV: 99.4 fL (ref 78.0–100.0)
Platelets: 144 10*3/uL — ABNORMAL LOW (ref 150–400)
RBC: 3.47 MIL/uL — ABNORMAL LOW (ref 4.22–5.81)
RDW: 15.6 % — ABNORMAL HIGH (ref 11.5–15.5)

## 2013-10-15 LAB — BASIC METABOLIC PANEL
BUN: 24 mg/dL — ABNORMAL HIGH (ref 6–23)
Calcium: 8.5 mg/dL (ref 8.4–10.5)
Chloride: 104 mEq/L (ref 96–112)
Creatinine, Ser: 1.46 mg/dL — ABNORMAL HIGH (ref 0.50–1.35)
GFR calc Af Amer: 53 mL/min — ABNORMAL LOW (ref >90)
GFR calc non Af Amer: 46 mL/min — ABNORMAL LOW (ref >90)

## 2013-10-15 SURGERY — CARDIOVERSION
Anesthesia: General

## 2013-10-15 MED ORDER — SODIUM CHLORIDE 0.9 % IV BOLUS (SEPSIS)
250.0000 mL | Freq: Once | INTRAVENOUS | Status: AC
  Administered 2013-10-15: 250 mL via INTRAVENOUS

## 2013-10-15 MED ORDER — FENTANYL CITRATE 0.05 MG/ML IJ SOLN
INTRAMUSCULAR | Status: AC
Start: 2013-10-15 — End: 2013-10-15
  Filled 2013-10-15: qty 2

## 2013-10-15 MED ORDER — ENOXAPARIN SODIUM 100 MG/ML ~~LOC~~ SOLN
1.0000 mg/kg | Freq: Two times a day (BID) | SUBCUTANEOUS | Status: DC
  Administered 2013-10-15 (×2): 85 mg via SUBCUTANEOUS
  Filled 2013-10-15 (×3): qty 1

## 2013-10-15 MED ORDER — ENOXAPARIN SODIUM 100 MG/ML ~~LOC~~ SOLN: 1.0000 mg/kg | Freq: Two times a day (BID) | SUBCUTANEOUS | Status: DC

## 2013-10-15 MED ORDER — MIDAZOLAM HCL 2 MG/2ML IJ SOLN
1.0000 mg | Freq: Once | INTRAMUSCULAR | Status: AC
  Administered 2013-10-15: 1 mg via INTRAVENOUS

## 2013-10-15 MED ORDER — MIDAZOLAM HCL 2 MG/2ML IJ SOLN
3.0000 mg | Freq: Once | INTRAMUSCULAR | Status: AC
  Administered 2013-10-15: 3 mg via INTRAVENOUS

## 2013-10-15 MED ORDER — PROPOFOL 10 MG/ML IV BOLUS
INTRAVENOUS | Status: DC | PRN
  Administered 2013-10-15: 30 mg via INTRAVENOUS

## 2013-10-15 MED ORDER — FENTANYL CITRATE 0.05 MG/ML IJ SOLN
50.0000 ug | Freq: Once | INTRAMUSCULAR | Status: AC
  Administered 2013-10-15: 50 ug via INTRAVENOUS

## 2013-10-15 MED ORDER — MIDAZOLAM HCL 2 MG/2ML IJ SOLN
INTRAMUSCULAR | Status: AC
  Filled 2013-10-15: qty 6

## 2013-10-15 MED ORDER — SODIUM CHLORIDE 0.9 % IV SOLN
INTRAVENOUS | Status: DC | PRN
  Administered 2013-10-15: 11:00:00 via INTRAVENOUS

## 2013-10-15 NOTE — Preoperative (Signed)
Beta Blockers   Reason not to administer Beta Blockers:Not Applicable 

## 2013-10-15 NOTE — Progress Notes (Signed)
CARDIAC REHAB PHASE I   PRE:  Rate/Rhythm: 70 pacing    BP: sitting 99/63    SaO2: 96 RA  MODE:  Ambulation: 700 ft   POST:  Rate/Rhythm: 85 pacing    BP: sitting 120/83     SaO2: 99 RA  Tolerated well. Feeling good. HR and BP controlled. Encouraged IS over weekend. Pt ready for surgery.  [id number]   Elissa Lovett [place] CES, ACSM 10/15/2013 3:37 PM

## 2013-10-15 NOTE — Progress Notes (Signed)
[name] has been discussed with the VAD Medical Review board on 10/12/13. The team feels as if the patient is a good candidate for DT LVAD therapy. The patient meets criteria for a LVAD implant as listed below:  1)  Have failed to respond to optimal medical management (including beta-blockers, and            ACE inhibitors if tolerated) for at least 45 of the last 60 days, or have been balloon            dependent for 7 days, or IV inotrope dependent for 14 days.   2)  Have a left ventricular ejection fraction (LVEF) < 25%; and, have demonstrated       functional limitation with a peak oxygen consumption of <14 ml/kg/min unless balloon             pump or inotrope dependent or physically unable to perform the test.         EF 20 - 25 %  By echo 09/21/13            CPX 09/16/13:   pVO2 10.9   RER 1.11    VE/VCo2 slope 48  3)  Social work and palliative care evaluations demonstrate appropriate support system               in place for discharge to home with a VAD and that end of life discussions have        taken place.         Social work consult 10/07/13: Lasandra Beech, LCSW        Palliative Care Consult 09/23/13:  Lorinda Creed, NP  4)  Primary caretaker identified that can be taught along with the patient how to manage        the VAD equipment.       Name:   [name]   5)  Deemed appropriate by our financial coordinator.        Prior approval:  pending    6)  VAD Coordinator has met with patient and caregiver and shown them the actual VAD       equipment. Along with showing them the equipment the VAD Coordinator has                 discussed with the patient and caregiver about lifestyle changes and the consent                    form.        Met with patient and significant other on 09/18/13 and 10/15/13.  7)  Patient has been discussed with [facility] (Dr.Patel) and felt to be an appropriate candidate for DT VAD.  8)  Intermacs profile: 3  9)  NYHA Class:  IV

## 2013-10-15 NOTE — Progress Notes (Signed)
Day of Surgery Procedure(s) (LRB): CARDIOVERSION (N/A) Subjective: Ischemic cardiomyopathy EF .20 Acute on chronic systolic  HF Atrial flutter cardioverted to NSR today TEE shows mod AI Patient has been reviewed and evaluated by adv HF- MCS team and found to be appropriate candidate for LVAD with combined aortic valve  closure His medical condition has been optimized for scheduled LVAD implant Dec 15  I presented the patient at the [facility] Cardiac Transplant   Listing Conference today. The patient was deemed to be an acceptable candidate for DT VAD implantation for advanced HF- and was felt not to be a heart transplant candidate due to advanced age and severe peripheral vascular disease by Dr Jose Persia  ( cardiology attending) and Dr Willia Craze ( CT surgery attending)  I returned to [facility] and discussed the recommendations and approval by [facility] for DT VAD therapy to the patient and his partner.The patient understands that he will receive VAD therapy as Destination Therapy and the patient understands why he is not a heart transplant candidate.   Objective: Vital signs in last 24 hours: Temp:  [97.7 F (36.5 C)-98.7 F (37.1 C)] 97.7 F (36.5 C) (12/11 1150) Pulse Rate:  [41-80] 70 (12/11 1150) Cardiac Rhythm:  [-] Atrial paced (12/11 1150) Resp:  [11-19] 14 (12/11 1150) BP: (75-117)/(39-79) 78/44 mmHg (12/11 1150) SpO2:  [88 %-100 %] 96 % (12/11 1150) Weight:  [188 lb 0.8 oz (85.3 kg)] 188 lb 0.8 oz (85.3 kg) (12/11 0400)  Hemodynamic parameters for last 24 hours:    Intake/Output from previous day: 12/10 0701 - 12/11 0700 In: 688 [P.O.:360; I.V.:328] Out: 300 [Urine:300] Intake/Output this shift: Total I/O In: 72 [I.V.:72] Out: -   No edema Lungs clear Neuro intact  Lab Results:  Recent Labs  10/14/13 0341 10/15/13 0444  WBC 5.3 5.0  HGB 11.8* 11.2*  HCT 36.7* 34.5*  PLT 168 144*   BMET:  Recent Labs  10/14/13 0341 10/15/13 0444  NA 138 138   K 4.2 4.2  CL 103 104  CO2 27 26  GLUCOSE 100* 97  BUN 25* 24*  CREATININE 1.62* 1.46*  CALCIUM 8.6 8.5    PT/INR:  Recent Labs  10/14/13 0341  LABPROT 14.7  INR 1.17   ABG    Component Value Date/Time   PHART 7.447 10/14/2013 0452   HCO3 25.0* 10/14/2013 0856   HCO3 25.9* 10/14/2013 0856   TCO2 26 10/14/2013 0856   TCO2 27 10/14/2013 0856   ACIDBASEDEF 1.0 10/14/2013 0856   O2SAT 55.0 10/14/2013 0856   O2SAT 54.0 10/14/2013 0856   CBG (last 3)  No results found for this basename: GLUCAP,  in the last 72 hours  Assessment/Plan: S/P Procedure(s) (LRB): CARDIOVERSION (N/A) Plan discharge later today on Lovenox for atrial arrythmia with scheduled admission on Dec 14  for VAD implantation am Dec 15 at [facility]   LOS: 3 days    VAN TRIGT III,Saxton Chain 10/15/2013

## 2013-10-15 NOTE — CV Procedure (Signed)
    TRANSESOPHAGEAL ECHOCARDIOGRAM GUIDED DIRECT CURRENT CARDIOVERSION  NAME:  [name]   MRN: [id number] DOB:  [birth year]   ADMIT DATE: 10/12/2013  INDICATIONS: Atrial flutter   PROCEDURE:   Informed consent was obtained prior to the procedure. The risks, benefits and alternatives for the procedure were discussed and the patient comprehended these risks.  Risks include, but are not limited to, cough, sore throat, vomiting, nausea, somnolence, esophageal and stomach trauma or perforation, bleeding, low blood pressure, aspiration, pneumonia, infection, trauma to the teeth and death.    After a procedural time-out, the patient was given 4 mg versed and 50 mcg fentanyl for moderate sedation.  The oropharynx was anesthetized with aerosolized cetacaine.  The transesophageal probe was inserted in the esophagus and stomach without difficulty and multiple views were obtained.   FINDINGS:  LEFT VENTRICLE: Dilated. Severe global HK. EF = 20%  RIGHT VENTRICLE: Moderate hypokinesis. + ICD wire  LEFT ATRIUM: Moderately dilated  LEFT ATRIAL APPENDAGE: No thrombus. Emptying velocity 40 cm/s  RIGHT ATRIUM: Normal. + ICD wire  AORTIC VALVE:  Trileaflet. Mildly thickened. Moderate AI  MITRAL VALVE:   Dilated annulus. Mild to moderate central MR. Pulmonic veins - no reversal of flow.   TRICUSPID VALVE: Normal. Trivial TR  PULMONIC VALVE: Grossly normal. Trivial PR  INTERATRIAL SEPTUM: Normal. No PFO or ASD.   PERICARDIUM: No effusion  DESCENDING AORTA: Diffuse severe atheromatous disease   CARDIOVERSION:     Indications:  Atrial Flutter  Procedure Details:  Once the TEE was complete, the patient had the defibrillator pads placed in the anterior and posterior position. The patient then underwent further sedation by the anesthesia service for cardioversion. Once an appropriate level of sedation was achieved, the patient received a single biphasic, synchronized 150J shock with prompt  conversion to sinus rhythm. No apparent complications.  COMPLICATIONS:    There were no immediate complications.   CONCLUSION:   1.  Successful TEE guided cardioversion of AFL.   Daniel Bensimhon,MD 11:20 AM

## 2013-10-15 NOTE — Transfer of Care (Signed)
Immediate Anesthesia Transfer of Care Note  Patient: [name]  Procedure(s) Performed: Procedure(s): CARDIOVERSION (N/A)  Patient Location: Nursing Unit  Anesthesia Type:General  Level of Consciousness: sedated  Airway & Oxygen Therapy: Patient Spontanous Breathing and Patient connected to nasal cannula oxygen  Post-op Assessment: Report given to PACU RN, Post -op Vital signs reviewed and stable and Patient moving all extremities  Post vital signs: Reviewed and stable  Complications: No apparent anesthesia complications

## 2013-10-15 NOTE — Interval H&P Note (Signed)
History and Physical Interval Note:  10/15/2013 11:16 AM  [name]  has presented today for surgery, with the diagnosis of a flutter  The various methods of treatment have been discussed with the patient and family. After consideration of risks, benefits and other options for treatment, the patient has consented to  Procedure(s): CARDIOVERSION (N/A) as a surgical intervention .  The patient's history has been reviewed, patient examined, no change in status, stable for surgery.  I have reviewed the patient's chart and labs.  Questions were answered to the patient's satisfaction.     Daniel Bensimhon

## 2013-10-15 NOTE — Progress Notes (Signed)
Advanced Heart Failure Rounding Note  Primary EP: Dr. Klein  Primary Physician: Dr. Walter Wray ([place], [place])  Reason for Consultation: Heart failure with recurrent VT (syncope)   Subjective:    [name] is a 72 yo man with an ischemic CM s/p ICD implant, chronic systolic HF (EF 20-25%), paroxysmal VT s/p ablation (09/2011) and repeat (06/2013), CAD, PVD (s/p R fem pop BPG 01/2010; s/p L CFA BPG 05/2002), AAA (s/p repair 04/2002), CAS (s/p L CEA 2008), HTN, arthritis, and TIA.   Over the past couple of months has had multiple episodes of Vtach with ICD shocks. He was recently discharged from the hospital 09/29/13 for recurrent ICD shock. He was on quindine which was stopped because of possible quindine induced fever, however then repeat CT showed possible aspiration PNA and he was treated with full course of antibiotics. He completed work-up for LVAD and was seen last week in clinic, which at that time he reported no fevers and feeling well.   RHC yesterday RA = 3  RV = 29/2/5  PA = 31/16 (22)  PCW = 13  Fick cardiac output/index = 4.4/2.1  PVR = 2.0 WU  FA sat = 94%  PA sat = 54%, 55%  Denies SOB, orthopnea, palpitations or CP. NPO overnight TEE-DC-CV today. Cr stable.   Objective:   Weight Range:  Vital Signs:   Temp:  [97.8 F (36.6 C)-98.7 F (37.1 C)] 98 F (36.7 C) (12/11 0335) Pulse Rate:  [41-97] 68 (12/11 0335) Resp:  [11-25] 16 (12/11 0335) BP: (81-105)/(52-71) 102/55 mmHg (12/11 0335) SpO2:  [95 %-100 %] 100 % (12/11 0335) Weight:  [188 lb 0.8 oz (85.3 kg)] 188 lb 0.8 oz (85.3 kg) (12/11 0400) Last BM Date: 10/12/13  Weight change: Filed Weights   10/13/13 0500 10/14/13 0500 10/15/13 0400  Weight: 190 lb 4.1 oz (86.3 kg) 185 lb 13.6 oz (84.3 kg) 188 lb 0.8 oz (85.3 kg)    Intake/Output:   Intake/Output Summary (Last 24 hours) at 10/15/13 0713 Last data filed at 10/15/13 0700  Gross per 24 hour  Intake    666 ml  Output    300 ml  Net    366 ml      Physical Exam: General:  Well appearing. No resp difficulty; lying in bed HEENT: normal Neck: supple. JVP 7 . Carotids 2+ bilat; no bruits. No lymphadenopathy or thryomegaly appreciated. Cor: PMI nondisplaced. Regular rate & irregular rhythm. No rubs, gallops or murmurs. Lungs: clear Abdomen: soft, nontender, nondistended. No hepatosplenomegaly. No bruits or masses. Good bowel sounds. Extremities: no cyanosis, clubbing, rash, edema Neuro: alert & orientedx3, cranial nerves grossly intact. moves all 4 extremities w/o difficulty. Affect pleasant  Telemetry: Atrial paced 70s  Labs: Basic Metabolic Panel:  Recent Labs Lab 10/08/13 1503 10/12/13 0951 10/13/13 0554 10/14/13 0341 10/15/13 0444  NA 138 138 141 138 138  K 5.1 4.8 4.5 4.2 4.2  CL 102 104 106 103 104  CO2 28 26 27 27 26  GLUCOSE 98 109* 93 100* 97  BUN 26* 30* 25* 25* 24*  CREATININE 1.54* 1.80* 1.63* 1.62* 1.46*  CALCIUM 9.3 8.8 8.7 8.6 8.5    Liver Function Tests:  Recent Labs Lab 10/08/13 1503 10/12/13 0951  AST 30 34  ALT 32 31  ALKPHOS 140* 140*  BILITOT 0.5 0.3  PROT 7.2 6.3  ALBUMIN 3.5 3.0*   No results found for this basename: LIPASE, AMYLASE,  in the last 168 hours No results found for   this basename: AMMONIA,  in the last 168 hours  CBC:  Recent Labs Lab 10/12/13 0951 10/14/13 0341 10/15/13 0444  WBC 5.8 5.3 5.0  NEUTROABS 4.3  --   --   HGB 11.3* 11.8* 11.2*  HCT 34.9* 36.7* 34.5*  MCV 100.3* 99.5 99.4  PLT 172 168 144*    Cardiac Enzymes:  Recent Labs Lab 10/12/13 0951  TROPONINI <0.30    BNP: BNP (last 3 results)  Recent Labs  09/15/13 0424 09/15/13 0750 10/12/13 0951  PROBNP 2704.0* 3038.0* 3404.0*    Imaging: No results found.   Medications:     Scheduled Medications: . amiodarone  400 mg Oral Daily  . aspirin EC  81 mg Oral Daily  . atorvastatin  80 mg Oral q1800  . carvedilol  6.25 mg Oral BID WC  . levothyroxine  112 mcg Oral QAC breakfast  .  multivitamin-lutein  1 capsule Oral Daily  . sodium chloride  3 mL Intravenous Q12H  . sodium chloride  3 mL Intravenous Q12H    Infusions: . sodium chloride 250 mL (10/14/13 1500)  . heparin 1,200 Units/hr (10/14/13 1500)    PRN Medications: sodium chloride, acetaminophen, ondansetron (ZOFRAN) IV, sodium chloride, sodium chloride   Assessment:   1) Syncope  2) Recurrent Vtach  - ablation 09/2011 and 06/2013  3) Chronic systolic HF, EF 20-25% (09/2013)  4) Mixed NICM/ICM  5) CAD  6) PAD  Plan/Discussion:    RHC yesterday and hemodynamics optimized. Concern with atrial flutter and cycle length 280 msec may have clot, will TEE/DC-CV today. Started on heparin yesterday.   6 min walk test completed. He is being presented to [facility] at [facility] today. Continue to work with CR.  Length of Stay: 3   Cosgrove, Ali B NP-C 10/15/2013, 7:13 AM  Advanced Heart Failure Team Pager [phone] (M-F; 7a - 4p)  Please contact [facility] for night-coverage after hours (4p -7a ) and weekends on [email or web address]  Patient seen and examined with Ali Cosgrove, NP. We discussed all aspects of the encounter. I agree with the assessment and plan as stated above.   Device interrogated at bedside. Remains in AFL. Will plan TEE/DC-CV today. Hopefully we can send home on lovenox.  RHC reviewed - hemodynamics optimized.  Patient presented today at [facility] conference and approved for Heartmate II implantation under DT criteria.  Daniel Bensimhon,MD 9:54 AM     

## 2013-10-15 NOTE — Progress Notes (Addendum)
Lovenox injection education given to patient and s/o Trina Ao. Hilda Lias will be performing injections. Questions regarding education and administration answered, Hilda Lias feels comfortable giving injections safely, demonstrated injection correctly. Patient comfortably with education. Instructed on built in safety device. Pt. Does not have sharps box at home, will bring empty lovenox syringes back up readmission for procedure Monday.   Pt. Given education and incentive spirometer for home use prior to surgery per VAD coordinator.

## 2013-10-15 NOTE — Progress Notes (Signed)
Have attempted to ambulate pt x2 today. Now on the way to CT. Will f/u if able. Ethelda Chick CES, ACSM 1:52 PM 10/15/2013

## 2013-10-15 NOTE — H&P (View-Only) (Signed)
Advanced Heart Failure Rounding Note  Primary EP: Dr. Graciela Husbands  Primary Physician: Dr. [name] ([place], [place])  Reason for Consultation: Heart failure with recurrent VT (syncope)   Subjective:    [name] is a 72 yo man with an ischemic CM s/p ICD implant, chronic systolic HF (EF 20-25%), paroxysmal VT s/p ablation (09/2011) and repeat (06/2013), CAD, PVD (s/p R fem pop BPG 01/2010; s/p L CFA BPG 05/2002), AAA (s/p repair 04/2002), CAS (s/p L CEA 2008), HTN, arthritis, and TIA.   Over the past couple of months has had multiple episodes of Vtach with ICD shocks. He was recently discharged from the hospital 09/29/13 for recurrent ICD shock. He was on quindine which was stopped because of possible quindine induced fever, however then repeat CT showed possible aspiration PNA and he was treated with full course of antibiotics. He completed work-up for LVAD and was seen last week in clinic, which at that time he reported no fevers and feeling well.   RHC yesterday RA = 3  RV = 29/2/5  PA = 31/16 (22)  PCW = 13  Fick cardiac output/index = 4.4/2.1  PVR = 2.0 WU  FA sat = 94%  PA sat = 54%, 55%  Denies SOB, orthopnea, palpitations or CP. NPO overnight TEE-DC-CV today. Cr stable.   Objective:   Weight Range:  Vital Signs:   Temp:  [97.8 F (36.6 C)-98.7 F (37.1 C)] 98 F (36.7 C) (12/11 0335) Pulse Rate:  [41-97] 68 (12/11 0335) Resp:  [11-25] 16 (12/11 0335) BP: (81-105)/(52-71) 102/55 mmHg (12/11 0335) SpO2:  [95 %-100 %] 100 % (12/11 0335) Weight:  [188 lb 0.8 oz (85.3 kg)] 188 lb 0.8 oz (85.3 kg) (12/11 0400) Last BM Date: 10/12/13  Weight change: Filed Weights   10/13/13 0500 10/14/13 0500 10/15/13 0400  Weight: 190 lb 4.1 oz (86.3 kg) 185 lb 13.6 oz (84.3 kg) 188 lb 0.8 oz (85.3 kg)    Intake/Output:   Intake/Output Summary (Last 24 hours) at 10/15/13 0713 Last data filed at 10/15/13 0700  Gross per 24 hour  Intake    666 ml  Output    300 ml  Net    366 ml      Physical Exam: General:  Well appearing. No resp difficulty; lying in bed HEENT: normal Neck: supple. JVP 7 . Carotids 2+ bilat; no bruits. No lymphadenopathy or thryomegaly appreciated. Cor: PMI nondisplaced. Regular rate & irregular rhythm. No rubs, gallops or murmurs. Lungs: clear Abdomen: soft, nontender, nondistended. No hepatosplenomegaly. No bruits or masses. Good bowel sounds. Extremities: no cyanosis, clubbing, rash, edema Neuro: alert & orientedx3, cranial nerves grossly intact. moves all 4 extremities w/o difficulty. Affect pleasant  Telemetry: Atrial paced 70s  Labs: Basic Metabolic Panel:  Recent Labs Lab 10/08/13 1503 10/12/13 0951 10/13/13 0554 10/14/13 0341 10/15/13 0444  NA 138 138 141 138 138  K 5.1 4.8 4.5 4.2 4.2  CL 102 104 106 103 104  CO2 28 26 27 27 26   GLUCOSE 98 109* 93 100* 97  BUN 26* 30* 25* 25* 24*  CREATININE 1.54* 1.80* 1.63* 1.62* 1.46*  CALCIUM 9.3 8.8 8.7 8.6 8.5    Liver Function Tests:  Recent Labs Lab 10/08/13 1503 10/12/13 0951  AST 30 34  ALT 32 31  ALKPHOS 140* 140*  BILITOT 0.5 0.3  PROT 7.2 6.3  ALBUMIN 3.5 3.0*   No results found for this basename: LIPASE, AMYLASE,  in the last 168 hours No results found for  this basename: AMMONIA,  in the last 168 hours  CBC:  Recent Labs Lab 10/12/13 0951 10/14/13 0341 10/15/13 0444  WBC 5.8 5.3 5.0  NEUTROABS 4.3  --   --   HGB 11.3* 11.8* 11.2*  HCT 34.9* 36.7* 34.5*  MCV 100.3* 99.5 99.4  PLT 172 168 144*    Cardiac Enzymes:  Recent Labs Lab 10/12/13 0951  TROPONINI <0.30    BNP: BNP (last 3 results)  Recent Labs  09/15/13 0424 09/15/13 0750 10/12/13 0951  PROBNP 2704.0* 3038.0* 3404.0*    Imaging: No results found.   Medications:     Scheduled Medications: . amiodarone  400 mg Oral Daily  . aspirin EC  81 mg Oral Daily  . atorvastatin  80 mg Oral q1800  . carvedilol  6.25 mg Oral BID WC  . levothyroxine  112 mcg Oral QAC breakfast  .  multivitamin-lutein  1 capsule Oral Daily  . sodium chloride  3 mL Intravenous Q12H  . sodium chloride  3 mL Intravenous Q12H    Infusions: . sodium chloride 250 mL (10/14/13 1500)  . heparin 1,200 Units/hr (10/14/13 1500)    PRN Medications: sodium chloride, acetaminophen, ondansetron (ZOFRAN) IV, sodium chloride, sodium chloride   Assessment:   1) Syncope  2) Recurrent Vtach  - ablation 09/2011 and 06/2013  3) Chronic systolic HF, EF 20-25% (09/2013)  4) Mixed NICM/ICM  5) CAD  6) PAD  Plan/Discussion:    RHC yesterday and hemodynamics optimized. Concern with atrial flutter and cycle length 280 msec may have clot, will TEE/DC-CV today. Started on heparin yesterday.   6 min walk test completed. He is being presented to [facility] today. Continue to work with CR.  Length of Stay: 3   Aundria Rud NP-C 10/15/2013, 7:13 AM  Advanced Heart Failure Team Pager [phone] (M-F; 7a - 4p)  Please contact [facility] for night-coverage after hours (4p -7a ) and weekends on [email or web address]  Patient seen and examined with Ulla Potash, NP. We discussed all aspects of the encounter. I agree with the assessment and plan as stated above.   Device interrogated at bedside. Remains in AFL. Will plan TEE/DC-CV today. Hopefully we can send home on lovenox.  RHC reviewed - hemodynamics optimized.  Patient presented today at [facility] Transplant conference and approved for Heartmate II implantation under DT criteria.  Rondia Higginbotham,MD 9:54 AM

## 2013-10-15 NOTE — Discharge Summary (Signed)
Advanced Heart Failure Team  Discharge Summary   Patient ID: [name] MRN: [id number], DOB/AGE: 05-21-1941 72 y.o. Admit date: 10/12/2013 D/C date:     10/15/2013   Primary Discharge Diagnoses:  1) Syncope due to VT  Secondary Discharge Diagnoses:  1) Recurrent Vtach - s/p ablation 09/2011 and 06/2013 2) Chronic systolic HF, EF 20-25% (09/2013) 3) Mixed NICM/ICM 4) CAD 5) PAD 6) Chronic kidney failure, stage III 7) Atrial tach/Atrial flutter  Hospital Course:  [name] is a 72 yo man with an ischemic CM s/p ICD implant, chronic systolic HF (EF 20-25%), paroxysmal VT s/p ablation (09/2011) and repeat (06/2013), CAD, PVD (s/p R fem pop BPG 01/2010; s/p L CFA BPG 05/2002), AAA (s/p repair 04/2002), CAS (s/p L CEA 2008), HTN, arthritis, and TIA.   Patient has been in the process of LVAD work-up and presented to the ED after syncopal episode on 10/13/13. His ICD was interrogated and he had atrial tach 2:1 conduction with subsequent inappropriate ATP which accelerated SVT>>VT with failed ATP and then spontaneous termination. He was admitted to the step-down unit. His volume status was slightly elevated upon admission and received one dose of 40 mg IV lasix with good response.   He was presented to the LVAD MRB meeting for evaluation of LVAD and the team decided he was a good candidate and was preliminarily scheduled for LVAD insertion on 10/19/13 after he had a RHC to assess hemodynamics and was presented at the listing conference at [facility]. He currently has NYHA IV symptoms and is considered class INTERMACS III.  RHC 10/14/13: RA = 3  RV = 29/2/5  PA = 31/16 (22)  PCW = 13  Fick cardiac output/index = 4.4/2.1  PVR = 2.0 WU  FA sat = 94%  PA sat = 54%, 55%  After his RHC there was discussion that his rhythm was atrial flutter with cycle length of 280 msec. The concern was that he could have an atrial appendage with the rate of his rhythm so he was scheduled for TEE-DC-CV.    TEE-DC-CV 10/15/13  LEFT VENTRICLE: Dilated. Severe global HK. EF = 20%  RIGHT VENTRICLE: Moderate hypokinesis. + ICD wire  LEFT ATRIUM: Moderately dilated  LEFT ATRIAL APPENDAGE: No thrombus. Emptying velocity 40 cm/s  RIGHT ATRIUM: Normal. + ICD wire  AORTIC VALVE: Trileaflet. Mildly thickened. Moderate AI  MITRAL VALVE: Dilated annulus. Mild to moderate central MR. Pulmonic veins - no reversal of flow.  TRICUSPID VALVE: Normal. Trivial TR  PULMONIC VALVE: Grossly normal. Trivial PR  INTERATRIAL SEPTUM: Normal. No PFO or ASD.  PERICARDIUM: No effusion  DESCENDING AORTA: Diffuse severe atheromatous disease   He was cardioverted successfully with conversion to NSR. SBP was soft after sedation and received 500 cc bolus with improvement. He will be sent home today with lovenox SQ BID until Sunday am when he will be admitted for planned LVAD on 10/19/13. His vitals were stable and weight on discharge was 188 lbs.   Discharge Weight Range: 185-190 Discharge Vitals: Blood pressure 81/53, pulse 80, temperature 97.9 F (36.6 C), temperature source Oral, resp. rate 11, height 6' (1.829 m), weight 188 lb 0.8 oz (85.3 kg), SpO2 95.00%.  Labs: Lab Results  Component Value Date   WBC 5.0 10/15/2013   HGB 11.2* 10/15/2013   HCT 34.5* 10/15/2013   MCV 99.4 10/15/2013   PLT 144* 10/15/2013    Recent Labs Lab 10/12/13 0951  10/15/13 0444  NA 138  < > 138  K  4.8  < > 4.2  CL 104  < > 104  CO2 26  < > 26  BUN 30*  < > 24*  CREATININE 1.80*  < > 1.46*  CALCIUM 8.8  < > 8.5  PROT 6.3  --   --   BILITOT 0.3  --   --   ALKPHOS 140*  --   --   ALT 31  --   --   AST 34  --   --   GLUCOSE 109*  < > 97  < > = values in this interval not displayed. Lab Results  Component Value Date   CHOL 118 11/28/2012   HDL 50.80 11/28/2012   LDLCALC 58 11/28/2012   TRIG 48.0 11/28/2012   BNP (last 3 results)  Recent Labs  09/15/13 0424 09/15/13 0750 10/12/13 0951  PROBNP 2704.0* 3038.0* 3404.0*     Diagnostic Studies/Procedures   No results found.  Discharge Medications     Medication List    ASK your doctor about these medications       amiodarone 200 MG tablet  Commonly known as:  PACERONE  Take 2 tablets (400 mg total) by mouth daily.     aspirin 81 MG tablet  Take 81 mg by mouth daily.     atorvastatin 80 MG tablet  Commonly known as:  LIPITOR  Take 80 mg by mouth daily.     CALCIUM-MAGNESIUM-ZINC PO  Take 3 tablets by mouth at bedtime. 1000 mg calcium, 400 mg magnesium, 25 mg zinc     carvedilol 6.25 MG tablet  Commonly known as:  COREG  Take 1 tablet (6.25 mg total) by mouth 2 (two) times daily with a meal.     EYE VITAMINS PO  Take 1 capsule by mouth 2 (two) times daily.     furosemide 20 MG tablet  Commonly known as:  LASIX  Take 20 mg by mouth daily.     levothyroxine 112 MCG tablet  Commonly known as:  SYNTHROID  Take 1 tablet (112 mcg total) by mouth daily.     multivitamin tablet  Take 1 tablet by mouth daily.     nitroGLYCERIN 0.4 MG SL tablet  Commonly known as:  NITROSTAT  Place 0.4 mg under the tongue every 5 (five) minutes as needed for chest pain. For chest pain        Disposition   The patient will be discharged in stable condition to home.      Future Appointments Provider Department Dept Phone   10/22/2013 4:00 PM Duke Salvia, MD [facility] [place] Office [phone]   03/30/2014 1:00 PM [facility] [place] [facility] [address] [phone]   03/30/2014 2:00 PM [facility] [place] [facility] [address] [phone]   03/30/2014 2:30 PM Pryor Ochoa, MD [facility] [phone]         Duration of Discharge Encounter: Greater than 35 minutes   Signed, Aundria Rud NP-C 10/15/2013, 11:16 AM  Patient seen and examined with Ulla Potash, NP. We discussed all aspects of the encounter. I agree with the assessment and plan as stated above.   He is much  improved. Now s/p DC-CV. Ready for discharge today with planned readmit on Sunday for VAD.  Will go home on lovenox.   Truman Hayward 2:38 PM

## 2013-10-15 NOTE — Progress Notes (Addendum)
TEE and Cardioversion completed. Time out completed prior to procedure. Dr. Gala Romney present; pt given total 4mg  versed, fentanyl. Placed on 3 liters oxygen per nasal cannula. Anesthesia present for CV. No complications, tolerated well. Frequent serial vital signs taken. Will continue to monitor BP and patient.   1120 Post procedure: Now on 2 liters per nasal cannula, SpO2 93. HR 70 A Paced. BP 77/39 (52). Family at bedside. Pt. Sleeping. No distress noted.  Verbal order from Dr. Gala Romney: Give NS bolus now. If sys BP not consistently 80s give second NS bolus; if sys pressure drops into 60s give few mcg levofed.

## 2013-10-15 NOTE — Progress Notes (Signed)
DC instructions given to patient and s/o. Questions answered. IV DC, hemostasis achieved. [name] gave lovenox injection, tolerated well. VSS. eICU and CCMT notified of DC home. Transported to private vehicle by RN via wheelchair. [name] to drive home, will pick up enoxaparin at [facility] pharmacy tonight for AM dosing. Educated to hold [employer], resume in AM, resume lasix in AM.

## 2013-10-15 NOTE — Progress Notes (Signed)
Day of Surgery Procedure(s) (LRB): CARDIOVERSION (N/A) Subjective:  He feels well following TEE and cardioversion today.  This showed moderate central AI, no clot in LAA  Objective: Vital signs in last 24 hours: Temp:  [97.7 F (36.5 C)-98.7 F (37.1 C)] 97.7 F (36.5 C) (12/11 1150) Pulse Rate:  [68-80] 70 (12/11 1300) Cardiac Rhythm:  [-] Atrial paced (12/11 1215) Resp:  [11-19] 18 (12/11 1300) BP: (75-117)/(39-79) 87/48 mmHg (12/11 1300) SpO2:  [88 %-100 %] 96 % (12/11 1300) Weight:  [188 lb 0.8 oz (85.3 kg)] 188 lb 0.8 oz (85.3 kg) (12/11 0400)  Hemodynamic parameters for last 24 hours:    Intake/Output from previous day: 12/10 0701 - 12/11 0700 In: 688 [P.O.:360; I.V.:328] Out: 300 [Urine:300] Intake/Output this shift: Total I/O In: 592 [I.V.:592] Out: -   General appearance: alert and cooperative Heart: regular rate and rhythm, S1, S2 normal, no murmur, click, rub or gallop Lungs: clear to auscultation bilaterally  Lab Results:  Recent Labs  10/14/13 0341 10/15/13 0444  WBC 5.3 5.0  HGB 11.8* 11.2*  HCT 36.7* 34.5*  PLT 168 144*   BMET:  Recent Labs  10/14/13 0341 10/15/13 0444  NA 138 138  [name] 4.2 4.2  CL 103 104  CO2 27 26  GLUCOSE 100* 97  BUN 25* 24*  CREATININE 1.62* 1.46*  CALCIUM 8.6 8.5    PT/INR:  Recent Labs  10/14/13 0341  LABPROT 14.7  INR 1.17   ABG    Component Value Date/Time   PHART 7.447 10/14/2013 0452   HCO3 25.0* 10/14/2013 0856   HCO3 25.9* 10/14/2013 0856   TCO2 26 10/14/2013 0856   TCO2 27 10/14/2013 0856   ACIDBASEDEF 1.0 10/14/2013 0856   O2SAT 55.0 10/14/2013 0856   O2SAT 54.0 10/14/2013 0856   CBG (last 3)  No results found for this basename: GLUCAP,  in the last 72 hours  Assessment/Plan:  The patient was presented at [facility] transplant conference and was felt to be an acceptable LVAD candidate as destination therapy. He was not felt to be a transplant candidate due to his age and diffuse vascular  disease. Dr. Maren Beach and I both discussed this with the patient and he understands and accepts this. He will require aortic valve closure at the time of LVAD implant. I discussed the operative procedure with the patient including alternatives, benefits and risks; including but not limited to bleeding, blood transfusion, infection, stroke, myocardial infarction, thrombosis or failure of the pump requiring replacement, organ dysfunction, and death.  [name] understands and agrees to proceed. He is going to go home this afternoon on Lovenox and will be admitted Sunday for surgery Monday morning.  LOS: 3 days    [name] 10/15/2013

## 2013-10-15 NOTE — Anesthesia Preprocedure Evaluation (Addendum)
Anesthesia Evaluation  Patient identified by MRN, date of birth, ID band Patient awake    Reviewed: Allergy & Precautions, H&P , NPO status , Patient's Chart, lab work & pertinent test results, reviewed documented beta blocker date and time   Airway Mallampati: II TM Distance: >3 FB Neck ROM: full    Dental  (+) Edentulous Upper and Edentulous Lower   Pulmonary sleep apnea , former smoker,  breath sounds clear to auscultation        Cardiovascular hypertension, On Medications, On Home Beta Blockers and Pt. on home beta blockers + angina + CAD, + Past MI, + Cardiac Stents, + Peripheral Vascular Disease and +CHF + dysrhythmias Ventricular Tachycardia + Cardiac Defibrillator Rhythm:regular     Neuro/Psych TIACVA negative psych ROS   GI/Hepatic negative GI ROS, Neg liver ROS,   Endo/Other  Hypothyroidism   Renal/GU Renal InsufficiencyRenal disease  negative genitourinary   Musculoskeletal   Abdominal   Peds  Hematology negative hematology ROS (+) anemia ,   Anesthesia Other Findings See surgeon's H&P   Reproductive/Obstetrics negative OB ROS                          Anesthesia Physical Anesthesia Plan  ASA: III  Anesthesia Plan: General   Post-op Pain Management:    Induction: Intravenous  Airway Management Planned: Mask  Additional Equipment:   Intra-op Plan:   Post-operative Plan:   Informed Consent: I have reviewed the patients History and Physical, chart, labs and discussed the procedure including the risks, benefits and alternatives for the proposed anesthesia with the patient or authorized representative who has indicated his/her understanding and acceptance.     Plan Discussed with: CRNA and Anesthesiologist  Anesthesia Plan Comments:         Anesthesia Quick Evaluation

## 2013-10-15 NOTE — Consult Note (Signed)
ANTICOAGULATION CONSULT NOTE - Follow up Consult  Pharmacy Consult for Heparin Indication: Aflutter  Allergies  Allergen Reactions  . Demerol [Meperidine]     Paralysis. Could only move eyes.  . Meperidine Hcl Other (See Comments)    "CAN'T MOVE" CATATONIC PER PT.  [name] Opium Other (See Comments)    "CAN'T MOVE" CATATONIC PER PT.    Patient Measurements: Height: 6' (182.9 cm) Weight: 188 lb 0.8 oz (85.3 kg) IBW/kg (Calculated) : 77.6 Heparin Dosing Weight: 84kg  Vital Signs: Temp: 97.9 F (36.6 C) (12/11 0739) Temp src: Oral (12/11 0739) BP: 100/65 mmHg (12/11 0739) Pulse Rate: 72 (12/11 0739)  Labs:  Recent Labs  10/13/13 0554 10/14/13 0341 10/14/13 2302 10/15/13 0444  HGB  --  11.8*  --  11.2*  HCT  --  36.7*  --  34.5*  PLT  --  168  --  144*  LABPROT  --  14.7  --   --   INR  --  1.17  --   --   HEPARINUNFRC  --   --  0.36 0.49  CREATININE 1.63* 1.62*  --  1.46*    Estimated Creatinine Clearance: 49.5 ml/min (by C-G formula based on Cr of 1.46).   Medical History: Past Medical History  Diagnosis Date  . CAD (coronary artery disease)     s/p MI 1982;  s/p DES to CFX 2011;  s/p NSTEMI 4/12 in setting of VTach;  cath 7/12:  LM ok, LAD mid 30-40%, Dx's with 40%, mCFX stent patent, prox to mid RCA occluded with R to R and L to R collats.  Medical Tx was continued  . Bradycardia   . Ventricular tachycardia     s/p AICD;   h/o ICD shock;  Amiodarone Rx. S/P ablation in 2012  . 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 HTN (hypertension)   . HLD (hyperlipidemia)   . Cytopenia   . Hypothyroidism   . Renal insufficiency   . Claudication   . Chronic systolic heart failure   . Ischemic cardiomyopathy     echo 7/12:  EF 25-30%, mild AI, mod MR, mod LAE  . TIA (transient ischemic attack) 2001  . CVD (cerebrovascular disease)     left  CEA 2008  . Stroke   . Anemia   . Inappropriate therapy from implantable cardioverter-defibrillator 04/02/2013    ATP for sinus tach  SVT wavelet identified SVT but was no  passive   . Myocardial infarction 1992  . Anginal pain   . Automatic implantable cardioverter-defibrillator in situ     [employer] Evera device serial number [id number]    . Sleep apnea     denies wearing mask (05/25/2013)  . Arthritis     "a little in my knees" (05/25/2013)  . Melanoma of ear 2013    "near left ear" (05/25/2013)   Assessment: 71yom undergoing LVAD workup was admitted 12/8 with syncope/diaphoresis. His ICD was interrogated and showed Vtach. He is currently in atrial tach/flutter with rates in the 200s. He began IV heparin with plans for TEE/DCCV today and then LVAD on Monday. Heparin drip 1200 uts/hr HL 0.49.  Will change from heparin to Lovenox 1mg /kg/q12  so that he can go home later today.    He also has some renal insufficiency  with sCr 1.5 and CrCl ~ 36ml/min.  Goal of Therapy:  Heparin level 0.6-1.2 4hr after Lovenox dose Monitor platelets by anticoagulation protocol: Yes   Plan:  1.  Lovenox 85mg  sq q12hr  2. turn heparin off after Loveonx dose give  Leota Sauers Pharm.D. CPP, BCPS Clinical Pharmacist [phone] 10/15/2013 10:10 AM

## 2013-10-15 NOTE — Progress Notes (Signed)
VAD educational packet including "HM II Patient Handbook", "HM II Left Ventricular Assist System" packet, and "[place] HM II Patient Education" has been reviewed with pt and [name] (primary caregiver).  Equipment demonstration with HM II training loop included the following:   Equipment reviewed:  a) power module  b) system controller  c) universal Magazine features editor  d) battery clips  e) Batteries  f)  Perc lock  g) Percutaneous lead   Demonstrated:  a) changing power source on system controller from tethered (power module) to untethered (battery) mode  b) changing power source on system controller from untethered (battery) to tethered (power module) mode  c) how to monitor battery life both on the system controller and on each individual battery  d) changing batteries   Stressed importance of never disconnecting power from both controller power leads at the same time, and never disconnecting both batteries at the same time, or the pump will stop.   Stressed importance of performing home inspection and assuring that only three pronged grounded power outlets can be used for VAD equipment. Patient confirmed home has electrical outlets that will support VAD equipment when patient discharged home.   Identified the following changes in activities of daily living with pump:  1. No driving for at least three months and then only if doctor gives permission to do so.  2. No tub baths while pump implanted, and shower only if doctor gives permission.  3. No swimming or submersion in water while implanted with pump.  4. No contact sports or engaging in jumping activities.  5. Always have a backup controller, charged spare batteries, and battery clips nearby at all times in case of     emergency.  6. Call the doctor or hospital contact person if any change in how the pump sounds, feels, or works.  7. Plan to sleep only when connected to the power module.  8. Do not sleep on your stomach.  9.  Keep a backup system controller, charged batteries, battery clips, and flashlight near you during sleep in case of     electrical power outage.  10. Exit site care including dressing changes, monitoring for infection, and importance of keeping percutaneous lead       stabilized at all times.   Reviewed pictures of VAD drive line, site care, dressing changes, and drive line stabilization including securement attachment device. Discussed with pt and family that they will be required to purchase dressing supplies as long as patient has the VAD in place.   Also discussed need for 24 hour/7 day week caregivers; pt designated Hilda Lias and two daughters as his caregivers.  Pt will need weekly clinic visits at discharge, frequent labs; also she will be on sternal precautions with no lifting, pushing, pulling and she will need assistance with adapting to new life style with VAD equipment and care. Stressed importance of designating 2 - 3 people to perform dressing changes to keep consistency with wound care.   Explained bridge to transplant versus destination therapy LVAD therapy; pt and Starling Manns expressed understanding of both and voiced agreement to proceed with VAD implant as destination therapy LVAD. Both asked appropriate questions, had good interaction with VAD coordinator, and verbalized understanding of above.   Intermacs QOL, KCCQ, and neurocognitive testing completed.

## 2013-10-15 NOTE — Progress Notes (Signed)
Echocardiogram Echocardiogram Transesophageal has been performed.  [name] 10/15/2013, 11:21 AM

## 2013-10-15 NOTE — Anesthesia Postprocedure Evaluation (Signed)
  Anesthesia Post-op Note  Patient: [name]  Procedure(s) Performed: Procedure(s): CARDIOVERSION (N/A)  Patient Location: Nursing Unit  Anesthesia Type:General  Level of Consciousness: awake, alert  and oriented  Airway and Oxygen Therapy: Patient Spontanous Breathing and Patient connected to nasal cannula oxygen  Post-op Pain: none  Post-op Assessment: Post-op Vital signs reviewed, Patient's Cardiovascular Status Stable, Respiratory Function Stable, Patent Airway and No signs of Nausea or vomiting  Post-op Vital Signs: Reviewed and stable  Complications: No apparent anesthesia complications

## 2013-10-16 ENCOUNTER — Encounter (HOSPITAL_COMMUNITY): Payer: Self-pay | Admitting: Internal Medicine

## 2013-10-18 ENCOUNTER — Inpatient Hospital Stay (HOSPITAL_COMMUNITY)
Admission: AD | Admit: 2013-10-18 | Discharge: 2013-11-18 | DRG: 002 | Disposition: A | Discharge status: Home Health | Payer: Medicare Other | Source: Ambulatory Visit | Attending: Surgery | Admitting: Surgery

## 2013-10-18 ENCOUNTER — Inpatient Hospital Stay (HOSPITAL_COMMUNITY): Payer: Medicare Other

## 2013-10-18 ENCOUNTER — Inpatient Hospital Stay: Admission: AD | Admit: 2013-10-18 | Payer: Self-pay | Source: Ambulatory Visit | Admitting: Internal Medicine

## 2013-10-18 DIAGNOSIS — I472 Ventricular tachycardia, unspecified: Secondary | ICD-10-CM

## 2013-10-18 DIAGNOSIS — Z79899 Other long term (current) drug therapy: Secondary | ICD-10-CM

## 2013-10-18 DIAGNOSIS — I5023 Acute on chronic systolic (congestive) heart failure: Secondary | ICD-10-CM | POA: Diagnosis present

## 2013-10-18 DIAGNOSIS — M1710 Unilateral primary osteoarthritis, unspecified knee: Secondary | ICD-10-CM | POA: Diagnosis present

## 2013-10-18 DIAGNOSIS — I509 Heart failure, unspecified: Secondary | ICD-10-CM | POA: Diagnosis present

## 2013-10-18 DIAGNOSIS — R5381 Other malaise: Secondary | ICD-10-CM | POA: Diagnosis present

## 2013-10-18 DIAGNOSIS — E039 Hypothyroidism, unspecified: Secondary | ICD-10-CM | POA: Diagnosis present

## 2013-10-18 DIAGNOSIS — I951 Orthostatic hypotension: Secondary | ICD-10-CM | POA: Diagnosis not present

## 2013-10-18 DIAGNOSIS — Z9861 Coronary angioplasty status: Secondary | ICD-10-CM

## 2013-10-18 DIAGNOSIS — I4891 Unspecified atrial fibrillation: Secondary | ICD-10-CM | POA: Diagnosis not present

## 2013-10-18 DIAGNOSIS — Z7982 Long term (current) use of aspirin: Secondary | ICD-10-CM

## 2013-10-18 DIAGNOSIS — I4729 Other ventricular tachycardia: Secondary | ICD-10-CM | POA: Diagnosis not present

## 2013-10-18 DIAGNOSIS — Z8673 Personal history of transient ischemic attack (TIA), and cerebral infarction without residual deficits: Secondary | ICD-10-CM

## 2013-10-18 DIAGNOSIS — I2589 Other forms of chronic ischemic heart disease: Secondary | ICD-10-CM | POA: Diagnosis present

## 2013-10-18 DIAGNOSIS — I2510 Atherosclerotic heart disease of native coronary artery without angina pectoris: Secondary | ICD-10-CM | POA: Diagnosis present

## 2013-10-18 DIAGNOSIS — D62 Acute posthemorrhagic anemia: Secondary | ICD-10-CM | POA: Diagnosis not present

## 2013-10-18 DIAGNOSIS — Z95810 Presence of automatic (implantable) cardiac defibrillator: Secondary | ICD-10-CM

## 2013-10-18 DIAGNOSIS — N289 Disorder of kidney and ureter, unspecified: Secondary | ICD-10-CM | POA: Diagnosis present

## 2013-10-18 DIAGNOSIS — Z95811 Presence of heart assist device: Secondary | ICD-10-CM

## 2013-10-18 DIAGNOSIS — I5022 Chronic systolic (congestive) heart failure: Principal | ICD-10-CM | POA: Diagnosis present

## 2013-10-18 DIAGNOSIS — J90 Pleural effusion, not elsewhere classified: Secondary | ICD-10-CM | POA: Diagnosis not present

## 2013-10-18 DIAGNOSIS — G4730 Sleep apnea, unspecified: Secondary | ICD-10-CM | POA: Diagnosis present

## 2013-10-18 DIAGNOSIS — K560 Paralytic ileus: Secondary | ICD-10-CM | POA: Diagnosis not present

## 2013-10-18 DIAGNOSIS — I739 Peripheral vascular disease, unspecified: Secondary | ICD-10-CM | POA: Diagnosis present

## 2013-10-18 DIAGNOSIS — I10 Essential (primary) hypertension: Secondary | ICD-10-CM | POA: Diagnosis present

## 2013-10-18 DIAGNOSIS — Z85820 Personal history of malignant melanoma of skin: Secondary | ICD-10-CM

## 2013-10-18 DIAGNOSIS — E785 Hyperlipidemia, unspecified: Secondary | ICD-10-CM | POA: Diagnosis present

## 2013-10-18 DIAGNOSIS — I252 Old myocardial infarction: Secondary | ICD-10-CM

## 2013-10-18 DIAGNOSIS — I50810 Right heart failure, unspecified: Secondary | ICD-10-CM

## 2013-10-18 DIAGNOSIS — I4892 Unspecified atrial flutter: Secondary | ICD-10-CM

## 2013-10-18 DIAGNOSIS — I428 Other cardiomyopathies: Secondary | ICD-10-CM | POA: Diagnosis present

## 2013-10-18 DIAGNOSIS — J9819 Other pulmonary collapse: Secondary | ICD-10-CM | POA: Diagnosis not present

## 2013-10-18 DIAGNOSIS — Z87891 Personal history of nicotine dependence: Secondary | ICD-10-CM

## 2013-10-18 LAB — BASIC METABOLIC PANEL
CO2: 25 mEq/L (ref 19–32)
GFR calc Af Amer: 49 mL/min — ABNORMAL LOW (ref >90)
Glucose, Bld: 91 mg/dL (ref 70–99)
Potassium: 5.1 mEq/L (ref 3.5–5.1)
Sodium: 139 mEq/L (ref 135–145)

## 2013-10-18 LAB — CBC
Hemoglobin: 11.9 g/dL — ABNORMAL LOW (ref 13.0–17.0)
MCHC: 33 g/dL (ref 30.0–36.0)
MCV: 100.3 fL — ABNORMAL HIGH (ref 78.0–100.0)
Platelets: 151 10*3/uL (ref 150–400)
RBC: 3.6 MIL/uL — ABNORMAL LOW (ref 4.22–5.81)

## 2013-10-18 LAB — PREPARE RBC (CROSSMATCH)

## 2013-10-18 LAB — MRSA PCR SCREENING: MRSA by PCR: NEGATIVE

## 2013-10-18 MED ORDER — CHLORHEXIDINE GLUCONATE 4 % EX LIQD
60.0000 mL | Freq: Once | CUTANEOUS | Status: AC
  Administered 2013-10-19: 4 via TOPICAL
  Filled 2013-10-18: qty 60

## 2013-10-18 MED ORDER — CHLORHEXIDINE GLUCONATE 4 % EX LIQD
60.0000 mL | Freq: Once | CUTANEOUS | Status: AC
  Administered 2013-10-18: 4 via TOPICAL
  Filled 2013-10-18: qty 60

## 2013-10-18 MED ORDER — DEXTROSE 5 % IV SOLN
750.0000 mg | INTRAVENOUS | Status: DC
  Filled 2013-10-18: qty 750

## 2013-10-18 MED ORDER — SODIUM CHLORIDE 0.9 % IV SOLN
INTRAVENOUS | Status: AC
  Administered 2013-10-19: 1 [IU]/h via INTRAVENOUS
  Filled 2013-10-18: qty 1

## 2013-10-18 MED ORDER — EPINEPHRINE HCL 1 MG/ML IJ SOLN
0.5000 ug/min | INTRAVENOUS | Status: DC
  Filled 2013-10-18: qty 4

## 2013-10-18 MED ORDER — CHLORHEXIDINE GLUCONATE 4 % EX LIQD
60.0000 mL | Freq: Once | CUTANEOUS | Status: AC
  Administered 2013-10-18: 4 via TOPICAL
  Filled 2013-10-18 (×2): qty 60

## 2013-10-18 MED ORDER — RIFAMPIN 300 MG PO CAPS
600.0000 mg | ORAL_CAPSULE | Freq: Once | ORAL | Status: AC
  Administered 2013-10-19: 600 mg via ORAL
  Filled 2013-10-18: qty 2

## 2013-10-18 MED ORDER — DOPAMINE-DEXTROSE 3.2-5 MG/ML-% IV SOLN
2.0000 ug/kg/min | INTRAVENOUS | Status: DC
  Filled 2013-10-18: qty 250

## 2013-10-18 MED ORDER — MUPIROCIN 2 % EX OINT
1.0000 "application " | TOPICAL_OINTMENT | Freq: Two times a day (BID) | CUTANEOUS | Status: AC
  Administered 2013-10-18 (×2): 1 via NASAL
  Filled 2013-10-18: qty 22

## 2013-10-18 MED ORDER — DIAZEPAM 5 MG PO TABS: 5.0000 mg | ORAL_TABLET | ORAL | Status: DC | PRN

## 2013-10-18 MED ORDER — NITROGLYCERIN IN D5W 200-5 MCG/ML-% IV SOLN
2.0000 ug/min | INTRAVENOUS | Status: AC
  Administered 2013-10-19: 15 ug/min via INTRAVENOUS
  Filled 2013-10-18: qty 250

## 2013-10-18 MED ORDER — TEMAZEPAM 7.5 MG PO CAPS: 15.0000 mg | ORAL_CAPSULE | Freq: Once | ORAL | Status: AC | PRN

## 2013-10-18 MED ORDER — DEXTROSE 5 % IV SOLN
1.5000 g | INTRAVENOUS | Status: AC
  Administered 2013-10-19: .75 g via INTRAVENOUS
  Administered 2013-10-19: 1.5 g via INTRAVENOUS
  Filled 2013-10-18: qty 1.5

## 2013-10-18 MED ORDER — CHLORHEXIDINE GLUCONATE CLOTH 2 % EX PADS
6.0000 | MEDICATED_PAD | Freq: Once | CUTANEOUS | Status: AC
  Administered 2013-10-19: 6 via TOPICAL

## 2013-10-18 MED ORDER — MAGNESIUM SULFATE 50 % IJ SOLN
40.0000 meq | INTRAMUSCULAR | Status: DC
  Filled 2013-10-18: qty 10

## 2013-10-18 MED ORDER — VANCOMYCIN HCL 10 G IV SOLR
1250.0000 mg | INTRAVENOUS | Status: AC
  Administered 2013-10-19: 1250 mg via INTRAVENOUS
  Filled 2013-10-18 (×2): qty 1250

## 2013-10-18 MED ORDER — CHLORHEXIDINE GLUCONATE 4 % EX LIQD
60.0000 mL | Freq: Once | CUTANEOUS | Status: AC
  Administered 2013-10-19: 4 via TOPICAL
  Filled 2013-10-18 (×2): qty 60

## 2013-10-18 MED ORDER — POTASSIUM CHLORIDE 2 MEQ/ML IV SOLN
80.0000 meq | INTRAVENOUS | Status: DC
  Filled 2013-10-18: qty 40

## 2013-10-18 MED ORDER — PHENYLEPHRINE HCL 10 MG/ML IJ SOLN
30.0000 ug/min | INTRAVENOUS | Status: AC
  Administered 2013-10-19: 20 ug/min via INTRAVENOUS
  Filled 2013-10-18: qty 2

## 2013-10-18 MED ORDER — SODIUM CHLORIDE 0.9 % IV SOLN
INTRAVENOUS | Status: DC
  Filled 2013-10-18: qty 30

## 2013-10-18 MED ORDER — SODIUM CHLORIDE 0.9 % IV SOLN
INTRAVENOUS | Status: DC
  Filled 2013-10-18: qty 40

## 2013-10-18 MED ORDER — BISACODYL 5 MG PO TBEC
5.0000 mg | DELAYED_RELEASE_TABLET | Freq: Once | ORAL | Status: AC
  Administered 2013-10-18: 5 mg via ORAL
  Filled 2013-10-18: qty 1

## 2013-10-18 MED ORDER — CHLORHEXIDINE GLUCONATE CLOTH 2 % EX PADS
6.0000 | MEDICATED_PAD | Freq: Once | CUTANEOUS | Status: AC
  Administered 2013-10-18: 6 via TOPICAL

## 2013-10-18 MED ORDER — PLASMA-LYTE 148 IV SOLN
INTRAVENOUS | Status: AC
  Administered 2013-10-19: 09:00:00
  Filled 2013-10-18: qty 2.5

## 2013-10-18 MED ORDER — DEXMEDETOMIDINE HCL IN NACL 400 MCG/100ML IV SOLN
0.1000 ug/kg/h | INTRAVENOUS | Status: AC
  Administered 2013-10-19: 0.3 ug/kg/h via INTRAVENOUS
  Administered 2013-10-19: 16:00:00 via INTRAVENOUS
  Filled 2013-10-18: qty 100

## 2013-10-18 NOTE — Progress Notes (Signed)
72 year old male with ischemic cardiomyopathy and recurrent paroxysmal VT despite ablations X 2 (09/25/11 and 06/24/13). 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 1.56 PVD S/P L.CFA BPG, R. Fem-Pop BPG  AAA  Repair 04/2002 L. CEA 2008 Htn  Anesthetic plan discussed, questions answered. I explained that he can expect to remain intubated on the ventilator at least overnight following surgery.  Kipp Brood, MD

## 2013-10-18 NOTE — H&P (Signed)
Advanced Heart Failure Team History and Physical Note   Primary EP: Dr. Graciela Husbands  Primary Physician: Dr. [name] ([place], [place])  Reason for Consultation: Heart failure with recurrent VT (syncope   HPI:    [name] is a 72 yo man with an ischemic CM s/p ICD implant, chronic systolic HF (EF 20-25%), paroxysmal VT s/p ablation (09/2011) and repeat (06/2013), CAD, PVD (s/p R fem pop BPG 01/2010; s/p L CFA BPG 05/2002), AAA (s/p repair 04/2002), CAS (s/p L CEA 2008), HTN, arthritis, and TIA.   Over the past couple of months has had multiple episodes of Vtach with ICD shocks. He was recently discharged from the hospital 09/29/13 for recurrent ICD shock. He was on quindine which was stopped because of possible quindine induced fever, however then repeat CT showed possible aspiration PNA and he was treated with full course of antibiotics. Worked up for LVAD.   Recently admitted 12/8-12/11/14 for syncopal episode from inappropriate ATP which accelerated SVT>>VT with failed ATP and then spontaneous termination of VT. During hospital stay had repeat RHC, hemodynamics were stable and had TEE-DC-CV of AFL 10/15/13 which was successful. He was presented to our [facility] team and felt to be good LVAD candidate and then presented at [facility] listing conference and they felt not good candidate for transplant, but good candidate for LVAD under DT criteria.   Planned admission today for LVAD implant for destination therapy tomorrow.   Review of Systems: [y] = yes, [ ]  = no   General: Weight gain [ ] ; Weight loss N ]; Anorexia [ ] ; Fatigue [Y ]; Fever [ ] ; Chills [ ] ; Weakness [ ]   Cardiac: Chest pain/pressure [ N]; Resting SOB [name] ]; Exertional SOB [Y ]; Orthopnea [ ] ; Pedal Edema [ ] ; Palpitations [name] ]; Syncope [ ] ; Presyncope [ ] ; Paroxysmal nocturnal dyspnea[ ]   Pulmonary: Cough [ ] ; Wheezing[ ] ; Hemoptysis[ ] ; Sputum [ ] ; Snoring [ ]   GI: Vomiting[ ] ; Dysphagia[ ] ; Melena[ ] ; Hematochezia [ ] ; Heartburn[ ] ; Abdominal  pain [ ] ; Constipation [ ] ; Diarrhea [ ] ; BRBPR [ ]   GU: Hematuria[ ] ; Dysuria [ ] ; Nocturia[ ]   Vascular: Pain in legs with walking [N ]; Pain in feet with lying flat [ ] ; Non-healing sores [ ] ; Stroke [ ] ; TIA [ ] ; Slurred speech [ ] ;  Neuro: Headaches[ ] ; Vertigo[ ] ; Seizures[ ] ; Paresthesias[ ] ;Blurred vision [ ] ; Diplopia [ ] ; Vision changes [ ]   Ortho/Skin: Arthritis [ ] ; Joint pain [ ] ; Muscle pain [ ] ; Joint swelling [ ] ; Back Pain [ ] ; Rash [ ]   Psych: Depression[ ] ; Anxiety[ ]   Heme: Bleeding problems [ ] ; Clotting disorders [ ] ; Anemia [ ]   Endocrine: Diabetes [ ] ; Thyroid dysfunction[ ]   Home Medications Prior to Admission medications   Medication Sig Start Date End Date Taking? Authorizing Provider  amiodarone (PACERONE) 200 MG tablet Take 2 tablets (400 mg total) by mouth daily. 08/31/13   Duke Salvia, MD  aspirin 81 MG tablet Take 81 mg by mouth daily.     Historical Provider, MD  atorvastatin (LIPITOR) 80 MG tablet Take 80 mg by mouth daily.  02/05/11   Historical Provider, MD  CALCIUM-MAGNESIUM-ZINC PO Take 3 tablets by mouth at bedtime. 1000 mg calcium, 400 mg magnesium, 25 mg zinc    Historical Provider, MD  carvedilol (COREG) 6.25 MG tablet Take 1 tablet (6.25 mg total) by mouth 2 (two) times daily with a meal. 10/08/13   Aundria Rud, NP  enoxaparin (LOVENOX) 100 MG/ML injection Inject 0.85 mLs (85 mg total) into the skin every 12 (twelve) hours. 10/15/13   Aundria Rud, NP  furosemide (LASIX) 20 MG tablet Take 20 mg by mouth daily.  05/12/13   Duke Salvia, MD  levothyroxine (SYNTHROID) 112 MCG tablet Take 1 tablet (112 mcg total) by mouth daily. 05/25/11   Peter M [place], MD  Multiple Vitamin (MULTIVITAMIN) tablet Take 1 tablet by mouth daily.     Historical Provider, MD  Multiple Vitamins-Minerals (EYE VITAMINS PO) Take 1 capsule by mouth 2 (two) times daily.     Historical Provider, MD  nitroGLYCERIN (NITROSTAT) 0.4 MG SL tablet Place 0.4 mg under the tongue every 5  (five) minutes as needed for chest pain. For chest pain    Historical Provider, MD    Past Medical History: Past Medical History  Diagnosis Date  . CAD (coronary artery disease)     s/p MI 1982;  s/p DES to CFX 2011;  s/p NSTEMI 4/12 in setting of VTach;  cath 7/12:  LM ok, LAD mid 30-40%, Dx's with 40%, mCFX stent patent, prox to mid RCA occluded with R to R and L to R collats.  Medical Tx was continued  . Bradycardia   . Ventricular tachycardia     s/p AICD;   h/o ICD shock;  Amiodarone Rx. S/P ablation in 2012  . 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 HTN (hypertension)   . HLD (hyperlipidemia)   . Cytopenia   . Hypothyroidism   . Renal insufficiency   . Claudication   . Chronic systolic heart failure   . Ischemic cardiomyopathy     echo 7/12:  EF 25-30%, mild AI, mod MR, mod LAE  . TIA (transient ischemic attack) 2001  . CVD (cerebrovascular disease)     left CEA 2008  . Stroke   . Anemia   . Inappropriate therapy from implantable cardioverter-defibrillator 04/02/2013    ATP for sinus tach  SVT wavelet identified SVT but was no  passive   . Myocardial infarction 1992  . Anginal pain   . Automatic implantable cardioverter-defibrillator in situ     Medtronic Evera device serial number [id number]    . Sleep apnea     denies wearing mask (05/25/2013)  . Arthritis     "a little in my knees" (05/25/2013)  . Melanoma of ear 2013    "near left ear" (05/25/2013)    Past Surgical History: Past Surgical History  Procedure Laterality Date  . Femoral-popliteal bypass graft Right 2011  . Cardiac defibrillator placement  2003; 2005; 05/25/2013    2014: Medtronic Evera device serial number [id number]  . Revascularization / in-situ graft leg    . Renal artery bypass Bilateral 2003  . Back surgery    . Lumbar disc surgery  1974; 2000's  . Knee  arthroscopy Bilateral 1990's  . Elbow arthroscopy Right 1990's  . Cholecystectomy  12/15/?2010  . Lipoma excision Left 2013    "near ear" (05/25/2013)  . Heel spur surgery Right 1990's  . Cataract extraction w/ intraocular lens  implant, bilateral Bilateral 2000  . Carotid endarterectomy Left ~ 2007  . Doppler echocardiography  2011  . Tonsillectomy  1946  . Femoral-popliteal bypass graft Left 05/2002    Hattie Perch 05/06/2002 (  05/25/2013)  . Tumor excision Left 1960's    "fatty tumor" (05/25/2013)  . Cardiac electrophysiology study and ablation  2012  . Coronary angioplasty  1992  . Cardiac catheterization      "several" (05/25/2013)  . Coronary angioplasty with stent placement      "last one was 11/2012  (05/25/2013)  . Cardioversion N/A 10/15/2013    Procedure: CARDIOVERSION;  Surgeon: Dolores Patty, MD;  Location: [facility] OR;  Service: Cardiovascular;  Laterality: N/A;    Family History: Family History  Problem Relation Age of Onset  . Coronary artery disease    . Heart attack Mother   . Coronary artery disease Mother   . Coronary artery disease Father   . Stroke Father     Social History: History   Social History  . Marital Status: Divorced    Spouse Name: N/A    Number of Children: 2  . Years of Education: N/A   Occupational History  . retired     [employer]  . Retired     [employer]  . Engineer, structural at [employer]   Social History Main Topics  . Smoking status: Former Smoker -- 0.50 packs/day for 37 years    Types: Cigarettes    Quit date: 11/05/2001  . Smokeless tobacco: Never Used  . Alcohol Use: 0.0 oz/week     Comment: 05/25/2013 "mixed drink couple times/month"  . Drug Use: No  . Sexual Activity: No   Other Topics Concern  . Not on file   Social History Narrative   Lives with sig other.    Allergies:  Allergies  Allergen Reactions  . Demerol [Meperidine]     Paralysis. Could only move eyes.  . Meperidine Hcl Other (See  Comments)    "CAN'T MOVE" CATATONIC PER PT.  [name] Opium Other (See Comments)    "CAN'T MOVE" CATATONIC PER PT.    Objective:    Vital Signs:   Temp:  [97.5 F (36.4 C)-97.9 F (36.6 C)] 97.5 F (36.4 C) (12/14 1618) Pulse Rate:  [69-70] 69 (12/14 1300) Resp:  [15-18] 15 (12/14 1300) BP: (100-137)/(59-75) 137/67 mmHg (12/14 1300) SpO2:  [98 %-100 %] 100 % (12/14 1300) Weight:  [85.3 kg (188 lb 0.8 oz)] 85.3 kg (188 lb 0.8 oz) (12/14 1100) Last BM Date: 10/17/13 Filed Weights   10/18/13 1100  Weight: 85.3 kg (188 lb 0.8 oz)    Physical Exam: General: Well appearing. No resp difficulty; lying in bed  HEENT: normal  Neck: supple. JVP 7 . Carotids 2+ bilat; no bruits. No lymphadenopathy or thryomegaly appreciated.  Cor: PMI nondisplaced. Regular rate & irregular rhythm. No rubs, gallops or murmurs.  Lungs: clear  Abdomen: soft, nontender, nondistended. No hepatosplenomegaly. No bruits or masses. Good bowel sounds.  Extremities: no cyanosis, clubbing, rash, edema  Neuro: alert & orientedx3, cranial nerves grossly intact. moves all 4 extremities w/o difficulty. Affect pleasant  Telemetry: AV paced 70s  Labs: Basic Metabolic Panel:  Recent Labs Lab 10/12/13 0951 10/13/13 0554 10/14/13 0341 10/15/13 0444 10/18/13 1255  NA 138 141 138 138 139  K 4.8 4.5 4.2 4.2 5.1  CL 104 106 103 104 103  CO2 26 27 27 26 25   GLUCOSE 109* 93 100* 97 91  BUN 30* 25* 25* 24* 27*  CREATININE 1.80* 1.63* 1.62* 1.46* 1.56*  CALCIUM 8.8 8.7 8.6 8.5 9.0    Liver Function Tests:  Recent Labs Lab 10/12/13 0951  AST  34  ALT 31  ALKPHOS 140*  BILITOT 0.3  PROT 6.3  ALBUMIN 3.0*   No results found for this basename: LIPASE, AMYLASE,  in the last 168 hours No results found for this basename: AMMONIA,  in the last 168 hours  CBC:  Recent Labs Lab 10/12/13 0951 10/14/13 0341 10/15/13 0444 10/18/13 1255  WBC 5.8 5.3 5.0 5.8  NEUTROABS 4.3  --   --   --   HGB 11.3* 11.8* 11.2* 11.9*   HCT 34.9* 36.7* 34.5* 36.1*  MCV 100.3* 99.5 99.4 100.3*  PLT 172 168 144* 151    Cardiac Enzymes:  Recent Labs Lab 10/12/13 0951  TROPONINI <0.30    BNP: BNP (last 3 results)  Recent Labs  09/15/13 0424 09/15/13 0750 10/12/13 0951  PROBNP 2704.0* 3038.0* 3404.0*    CBG: No results found for this basename: GLUCAP,  in the last 168 hours  Coagulation Studies:  Recent Labs  10/18/13 1255  LABPROT 15.3*  INR 1.24      Imaging: Dg Chest 2 View  10/18/2013   CLINICAL DATA:  Preop evaluation  EXAM: CHEST  2 VIEW  COMPARISON:  10/12/2013  FINDINGS: Cardiac silhouette is enlarged. A left-sided chest wall AICD unit is appreciated with lead tips projecting region of the right atrium and right ventricle. There is no evidence of focal infiltrates, effusions, nor edema. The osseous structures unremarkable.  IMPRESSION: Cardiomegaly without evidence of acute cardiopulmonary disease.   Electronically Signed   By: Salome Holmes M.D.   On: 10/18/2013 13:38        Assessment:   1) Chronic systolic HF, Class IV - EF 20-25% 2) NICM/ICM 3) Recurrent Vtach - ablation 09/2011 and 06/2013 4) Atrial flutter - s/p DC-CV 10/15/2013 5) CAD 6) PAD  Plan/Discussion:    Mr. Blansett is being admitted today for planned LVAD implant tomorrow for destination therapy. He currently has NYHA IV symptoms and his EF is less than 25%. He has been on OMM for at least the last 60 of 90 days and his HF continues to get worse. He was presented at our [facility] meeting 10/12/13 and the team felt was a good candidate for LVAD implant. He was also presented at the [facility] this past Thursday and they felt he was not a candidate for transplant, but could possibly benefit from LVAD.  The potential risks and benefits have been discussed with the patient and wife multiple times and they have had all their questions answered. He is aware that the LVAD may not improve his [name], however will  likely stabilize his symptoms. He is also aware from TEE last week that his RV is moderately decompensated and he may need a temporarily right ventricular assist device to support his RV - however low CVP on recent RHC is reassuring.  Plan to draw labs, prep for surgery and have lab prepare blood products for surgery tomorrow  Length of Stay: 0   Arvilla Meres MD 10/18/2013, 4:20 PM  Advanced Heart Failure Team Pager [phone] (M-F; 7a - 4p)  Please contact [facility] for night-coverage after hours (4p -7a ) and weekends on [email or web address]

## 2013-10-19 ENCOUNTER — Encounter (HOSPITAL_COMMUNITY): Payer: Medicare Other | Admitting: Anesthesiology

## 2013-10-19 ENCOUNTER — Inpatient Hospital Stay (HOSPITAL_COMMUNITY): Payer: Medicare Other | Admitting: Anesthesiology

## 2013-10-19 ENCOUNTER — Encounter (HOSPITAL_COMMUNITY)
Admission: AD | Disposition: A | Discharge status: Home Health | Payer: Medicare Other | Source: Ambulatory Visit | Attending: Surgery

## 2013-10-19 ENCOUNTER — Encounter (HOSPITAL_COMMUNITY): Payer: Self-pay

## 2013-10-19 ENCOUNTER — Inpatient Hospital Stay (HOSPITAL_COMMUNITY): Payer: Medicare Other

## 2013-10-19 DIAGNOSIS — I2589 Other forms of chronic ischemic heart disease: Secondary | ICD-10-CM

## 2013-10-19 DIAGNOSIS — I5023 Acute on chronic systolic (congestive) heart failure: Secondary | ICD-10-CM

## 2013-10-19 HISTORY — PX: INTRAOPERATIVE TRANSESOPHAGEAL ECHOCARDIOGRAM: SHX5062

## 2013-10-19 HISTORY — PX: INSERTION OF IMPLANTABLE LEFT VENTRICULAR ASSIST DEVICE: SHX5866

## 2013-10-19 LAB — POCT I-STAT 7, (LYTES, BLD GAS, ICA,H+H)
Calcium, Ion: 1.01 mmol/L — ABNORMAL LOW (ref 1.13–1.30)
HCT: 25 % — ABNORMAL LOW (ref 39.0–52.0)
Hemoglobin: 8.5 g/dL — ABNORMAL LOW (ref 13.0–17.0)
Patient temperature: 37
Potassium: 3.7 mEq/L (ref 3.5–5.1)
TCO2: 27 mmol/L (ref 0–100)
pCO2 arterial: 45.5 mmHg — ABNORMAL HIGH (ref 35.0–45.0)
pH, Arterial: 7.363 (ref 7.350–7.450)

## 2013-10-19 LAB — POCT I-STAT 3, ART BLOOD GAS (G3+)
Acid-Base Excess: 2 mmol/L (ref 0.0–2.0)
Acid-base deficit: 1 mmol/L (ref 0.0–2.0)
Acid-base deficit: 3 mmol/L — ABNORMAL HIGH (ref 0.0–2.0)
Bicarbonate: 27.8 mEq/L — ABNORMAL HIGH (ref 20.0–24.0)
O2 Saturation: 92 %
O2 Saturation: 96 %
O2 Saturation: 91 %
TCO2: 25 mmol/L (ref 0–100)
TCO2: 26 mmol/L (ref 0–100)
TCO2: 26 mmol/L (ref 0–100)
pCO2 arterial: 43.1 mmHg (ref 35.0–45.0)
pCO2 arterial: 47 mmHg — ABNORMAL HIGH (ref 35.0–45.0)
pCO2 arterial: 41.3 mmHg (ref 35.0–45.0)
pH, Arterial: 7.396 (ref 7.350–7.450)
pH, Arterial: 7.335 — ABNORMAL LOW (ref 7.350–7.450)
pH, Arterial: 7.334 — ABNORMAL LOW (ref 7.350–7.450)
pH, Arterial: 7.385 (ref 7.350–7.450)
pO2, Arterial: 293 mmHg — ABNORMAL HIGH (ref 80.0–100.0)
pO2, Arterial: 68 mmHg — ABNORMAL LOW (ref 80.0–100.0)
pO2, Arterial: 89 mmHg (ref 80.0–100.0)
pO2, Arterial: 73 mmHg — ABNORMAL LOW (ref 80.0–100.0)
pO2, Arterial: 64 mmHg — ABNORMAL LOW (ref 80.0–100.0)

## 2013-10-19 LAB — POCT I-STAT 3, VENOUS BLOOD GAS (G3P V)
O2 Saturation: 67 %
Patient temperature: 37.1
TCO2: 28 mmol/L (ref 0–100)
pCO2, Ven: 51.1 mmHg — ABNORMAL HIGH (ref 45.0–50.0)

## 2013-10-19 LAB — BASIC METABOLIC PANEL
BUN: 24 mg/dL — ABNORMAL HIGH (ref 6–23)
CO2: 26 mEq/L (ref 19–32)
Calcium: 8.8 mg/dL (ref 8.4–10.5)
Calcium: 8.5 mg/dL (ref 8.4–10.5)
Chloride: 104 mEq/L (ref 96–112)
Creatinine, Ser: 1.57 mg/dL — ABNORMAL HIGH (ref 0.50–1.35)
Creatinine, Ser: 1.24 mg/dL (ref 0.50–1.35)
GFR calc Af Amer: 48 mL/min — ABNORMAL LOW (ref >90)
GFR calc Af Amer: 64 mL/min — ABNORMAL LOW (ref >90)
GFR calc non Af Amer: 42 mL/min — ABNORMAL LOW (ref >90)
GFR calc non Af Amer: 56 mL/min — ABNORMAL LOW (ref >90)
Glucose, Bld: 99 mg/dL (ref 70–99)
Glucose, Bld: 39 mg/dL — CL (ref 70–99)
Potassium: 4.2 mEq/L (ref 3.5–5.1)
Sodium: 138 mEq/L (ref 135–145)
Sodium: 144 mEq/L (ref 135–145)

## 2013-10-19 LAB — POCT I-STAT 4, (NA,K, GLUC, HGB,HCT)
Glucose, Bld: 107 mg/dL — ABNORMAL HIGH (ref 70–99)
Glucose, Bld: 95 mg/dL (ref 70–99)
Glucose, Bld: 168 mg/dL — ABNORMAL HIGH (ref 70–99)
HCT: 34 % — ABNORMAL LOW (ref 39.0–52.0)
HCT: 25 % — ABNORMAL LOW (ref 39.0–52.0)
HCT: 31 % — ABNORMAL LOW (ref 39.0–52.0)
HCT: 27 % — ABNORMAL LOW (ref 39.0–52.0)
HCT: 22 % — ABNORMAL LOW (ref 39.0–52.0)
HCT: 26 % — ABNORMAL LOW (ref 39.0–52.0)
Hemoglobin: 11.6 g/dL — ABNORMAL LOW (ref 13.0–17.0)
Hemoglobin: 9.2 g/dL — ABNORMAL LOW (ref 13.0–17.0)
Hemoglobin: 5.4 g/dL — CL (ref 13.0–17.0)
Hemoglobin: 8.8 g/dL — ABNORMAL LOW (ref 13.0–17.0)
Potassium: 4.2 mEq/L (ref 3.5–5.1)
Potassium: 5.1 mEq/L (ref 3.5–5.1)
Potassium: 6.3 mEq/L (ref 3.5–5.1)
Potassium: 5.7 mEq/L — ABNORMAL HIGH (ref 3.5–5.1)
Potassium: 4.4 mEq/L (ref 3.5–5.1)
Sodium: 139 mEq/L (ref 135–145)
Sodium: 136 mEq/L (ref 135–145)
Sodium: 140 mEq/L (ref 135–145)
Sodium: 140 mEq/L (ref 135–145)

## 2013-10-19 LAB — CBC
HCT: 33.6 % — ABNORMAL LOW (ref 39.0–52.0)
HCT: 23.7 % — ABNORMAL LOW (ref 39.0–52.0)
Hemoglobin: 11.1 g/dL — ABNORMAL LOW (ref 13.0–17.0)
Hemoglobin: 8.9 g/dL — ABNORMAL LOW (ref 13.0–17.0)
Hemoglobin: 9.1 g/dL — ABNORMAL LOW (ref 13.0–17.0)
MCH: 32.8 pg (ref 26.0–34.0)
MCH: 31.4 pg (ref 26.0–34.0)
MCHC: 33 g/dL (ref 30.0–36.0)
MCHC: 33.8 g/dL (ref 30.0–36.0)
MCHC: 34.6 g/dL (ref 30.0–36.0)
MCV: 99.4 fL (ref 78.0–100.0)
MCV: 91.7 fL (ref 78.0–100.0)
MCV: 90.8 fL (ref 78.0–100.0)
Platelets: 138 10*3/uL — ABNORMAL LOW (ref 150–400)
Platelets: 132 10*3/uL — ABNORMAL LOW (ref 150–400)
Platelets: 141 10*3/uL — ABNORMAL LOW (ref 150–400)
RBC: 3.38 MIL/uL — ABNORMAL LOW (ref 4.22–5.81)
RBC: 2.85 MIL/uL — ABNORMAL LOW (ref 4.22–5.81)
RBC: 2.61 MIL/uL — ABNORMAL LOW (ref 4.22–5.81)
RDW: 15.6 % — ABNORMAL HIGH (ref 11.5–15.5)
RDW: 16.7 % — ABNORMAL HIGH (ref 11.5–15.5)
RDW: 17.3 % — ABNORMAL HIGH (ref 11.5–15.5)
RDW: 18.1 % — ABNORMAL HIGH (ref 11.5–15.5)
WBC: 5.2 10*3/uL (ref 4.0–10.5)
WBC: 10.3 10*3/uL (ref 4.0–10.5)
WBC: 12.5 10*3/uL — ABNORMAL HIGH (ref 4.0–10.5)

## 2013-10-19 LAB — BLOOD GAS, ARTERIAL
Bicarbonate: 24.8 mEq/L — ABNORMAL HIGH (ref 20.0–24.0)
O2 Saturation: 94.8 %
Patient temperature: 98.5
TCO2: 25.9 mmol/L (ref 0–100)
pH, Arterial: 7.453 — ABNORMAL HIGH (ref 7.350–7.450)
pO2, Arterial: 72.7 mmHg — ABNORMAL LOW (ref 80.0–100.0)

## 2013-10-19 LAB — DIC (DISSEMINATED INTRAVASCULAR COAGULATION) PANEL
Fibrinogen: 181 mg/dL — ABNORMAL LOW (ref 204–475)
INR: 1.15 (ref 0.00–1.49)
Platelets: 110 10*3/uL — ABNORMAL LOW (ref 150–400)

## 2013-10-19 LAB — POCT I-STAT, CHEM 8
BUN: 18 mg/dL (ref 6–23)
BUN: 18 mg/dL (ref 6–23)
Calcium, Ion: 1.37 mmol/L — ABNORMAL HIGH (ref 1.13–1.30)
Calcium, Ion: 1.28 mmol/L (ref 1.13–1.30)
Chloride: 105 mEq/L (ref 96–112)
Chloride: 105 mEq/L (ref 96–112)
Creatinine, Ser: 1.3 mg/dL (ref 0.50–1.35)
Glucose, Bld: 152 mg/dL — ABNORMAL HIGH (ref 70–99)
HCT: 27 % — ABNORMAL LOW (ref 39.0–52.0)
HCT: 29 % — ABNORMAL LOW (ref 39.0–52.0)
Potassium: 3.5 mEq/L (ref 3.5–5.1)
Potassium: 4.5 mEq/L (ref 3.5–5.1)
Sodium: 145 mEq/L (ref 135–145)
TCO2: 27 mmol/L (ref 0–100)

## 2013-10-19 LAB — CARBOXYHEMOGLOBIN
Carboxyhemoglobin: 1.8 % — ABNORMAL HIGH (ref 0.5–1.5)
Methemoglobin: 1.5 % (ref 0.0–1.5)

## 2013-10-19 LAB — CALCIUM, IONIZED: Calcium, Ion: 1.29 mmol/L (ref 1.13–1.30)

## 2013-10-19 LAB — CREATININE, SERUM: Creatinine, Ser: 1.31 mg/dL (ref 0.50–1.35)

## 2013-10-19 LAB — MAGNESIUM
Magnesium: 2 mg/dL (ref 1.5–2.5)
Magnesium: 2.8 mg/dL — ABNORMAL HIGH (ref 1.5–2.5)

## 2013-10-19 LAB — DIC (DISSEMINATED INTRAVASCULAR COAGULATION)PANEL
D-Dimer, Quant: 6.46 ug/mL-FEU — ABNORMAL HIGH (ref 0.00–0.48)
Fibrinogen: 232 mg/dL (ref 204–475)
INR: 1.74 — ABNORMAL HIGH (ref 0.00–1.49)
Platelets: 136 10*3/uL — ABNORMAL LOW (ref 150–400)
Prothrombin Time: 19.8 seconds — ABNORMAL HIGH (ref 11.6–15.2)

## 2013-10-19 LAB — PLATELET COUNT: Platelets: 95 10*3/uL — ABNORMAL LOW (ref 150–400)

## 2013-10-19 LAB — HEMOGLOBIN A1C
Hgb A1c MFr Bld: 5.5 % (ref <5.7)
Mean Plasma Glucose: 111 mg/dL (ref <117)

## 2013-10-19 SURGERY — INSERTION OF IMPLANTABLE LEFT VENTRICULAR ASSIST DEVICE
Anesthesia: General | Site: Chest

## 2013-10-19 MED ORDER — DOCUSATE SODIUM 100 MG PO CAPS
200.0000 mg | ORAL_CAPSULE | Freq: Every day | ORAL | Status: DC
  Administered 2013-10-21 – 2013-11-18 (×23): 200 mg via ORAL
  Filled 2013-10-19 (×28): qty 2

## 2013-10-19 MED ORDER — MILRINONE IN DEXTROSE 20 MG/100ML IV SOLN
0.3000 ug/kg/min | INTRAVENOUS | Status: DC
  Filled 2013-10-19: qty 100

## 2013-10-19 MED ORDER — SODIUM CHLORIDE 0.9 % IV SOLN
20.0000 ug | INTRAVENOUS | Status: DC | PRN
  Administered 2013-10-19: 20 ug via INTRAVENOUS

## 2013-10-19 MED ORDER — ASPIRIN 81 MG PO CHEW: 324.0000 mg | CHEWABLE_TABLET | Freq: Every day | ORAL | Status: DC

## 2013-10-19 MED ORDER — COAGULATION FACTOR VIIA RECOMB 1 MG IV SOLR
90.0000 ug/kg | Freq: Once | INTRAVENOUS | Status: DC
  Filled 2013-10-19: qty 8

## 2013-10-19 MED ORDER — SODIUM CHLORIDE 0.9 % IV SOLN
INTRAVENOUS | Status: DC
  Administered 2013-10-22: 14:00:00 via INTRAVENOUS
  Administered 2013-10-23: 20 mL via INTRAVENOUS
  Administered 2013-10-27: 12:00:00 via INTRAVENOUS
  Administered 2013-11-05: 10 mL/h via INTRAVENOUS
  Administered 2013-11-15: 12:00:00 via INTRAVENOUS

## 2013-10-19 MED ORDER — SODIUM CHLORIDE 0.9 % IJ SOLN
OROMUCOSAL | Status: DC | PRN
  Administered 2013-10-19: 16:00:00 via TOPICAL

## 2013-10-19 MED ORDER — SODIUM CHLORIDE 0.9 % IJ SOLN: 3.0000 mL | INTRAMUSCULAR | Status: DC | PRN

## 2013-10-19 MED ORDER — MILRINONE IN DEXTROSE 20 MG/100ML IV SOLN
INTRAVENOUS | Status: DC | PRN
  Administered 2013-10-19: .3 ug/kg/min via INTRAVENOUS

## 2013-10-19 MED ORDER — SODIUM CHLORIDE 0.9 % IV SOLN
250.0000 mL | INTRAVENOUS | Status: DC
  Administered 2013-10-20: 250 mL via INTRAVENOUS

## 2013-10-19 MED ORDER — HEPARIN SODIUM (PORCINE) 1000 UNIT/ML IJ SOLN
INTRAMUSCULAR | Status: DC | PRN
  Administered 2013-10-19: 32000 [IU] via INTRAVENOUS

## 2013-10-19 MED ORDER — CHLORHEXIDINE GLUCONATE CLOTH 2 % EX PADS
6.0000 | MEDICATED_PAD | Freq: Every day | CUTANEOUS | Status: AC
  Administered 2013-10-19 – 2013-10-21 (×3): 6 via TOPICAL

## 2013-10-19 MED ORDER — NOREPINEPHRINE BITARTRATE 1 MG/ML IJ SOLN
3.0000 ug/min | INTRAVENOUS | Status: DC
  Administered 2013-10-20: 6 ug/min via INTRAVENOUS
  Filled 2013-10-19 (×2): qty 4

## 2013-10-19 MED ORDER — VANCOMYCIN HCL 1000 MG IV SOLR
INTRAVENOUS | Status: DC | PRN
  Administered 2013-10-19: 1000 mg

## 2013-10-19 MED ORDER — ACETAMINOPHEN 160 MG/5ML PO SOLN: 650.0000 mg | Freq: Once | ORAL | Status: AC

## 2013-10-19 MED ORDER — SODIUM CHLORIDE 0.45 % IV SOLN
INTRAVENOUS | Status: DC
  Administered 2013-10-20 – 2013-10-22 (×2): via INTRAVENOUS

## 2013-10-19 MED ORDER — HEMOSTATIC AGENTS (NO CHARGE) OPTIME
TOPICAL | Status: DC | PRN
  Administered 2013-10-19: 1 via TOPICAL

## 2013-10-19 MED ORDER — BISACODYL 5 MG PO TBEC
10.0000 mg | DELAYED_RELEASE_TABLET | Freq: Every day | ORAL | Status: DC
  Administered 2013-10-21 – 2013-11-18 (×14): 10 mg via ORAL
  Filled 2013-10-19 (×20): qty 2

## 2013-10-19 MED ORDER — ETOMIDATE 2 MG/ML IV SOLN
INTRAVENOUS | Status: DC | PRN
  Administered 2013-10-19: 18 mg via INTRAVENOUS

## 2013-10-19 MED ORDER — VANCOMYCIN HCL 1000 MG IV SOLR
2000.0000 mg | INTRAVENOUS | Status: DC
  Filled 2013-10-19: qty 2000

## 2013-10-19 MED ORDER — ARTIFICIAL TEARS OP OINT
TOPICAL_OINTMENT | OPHTHALMIC | Status: DC | PRN
  Administered 2013-10-19: 1 via OPHTHALMIC

## 2013-10-19 MED ORDER — SODIUM CHLORIDE 0.9 % IV SOLN
INTRAVENOUS | Status: DC | PRN
  Administered 2013-10-19 (×2): via INTRAVENOUS

## 2013-10-19 MED ORDER — FAMOTIDINE IN NACL 20-0.9 MG/50ML-% IV SOLN
20.0000 mg | Freq: Two times a day (BID) | INTRAVENOUS | Status: AC
  Administered 2013-10-19 – 2013-10-20 (×2): 20 mg via INTRAVENOUS
  Filled 2013-10-19 (×2): qty 50

## 2013-10-19 MED ORDER — NOREPINEPHRINE BITARTRATE 1 MG/ML IJ SOLN
0.0000 ug/min | INTRAVENOUS | Status: AC
  Administered 2013-10-19: 3 ug/min via INTRAVENOUS
  Filled 2013-10-19: qty 8

## 2013-10-19 MED ORDER — SODIUM BICARBONATE 8.4 % IV SOLN
INTRAVENOUS | Status: DC | PRN
  Administered 2013-10-19: 50 meq via INTRAVENOUS

## 2013-10-19 MED ORDER — DEXMEDETOMIDINE HCL IN NACL 200 MCG/50ML IV SOLN
0.1000 ug/kg/h | INTRAVENOUS | Status: DC
  Administered 2013-10-19 – 2013-10-20 (×2): 0.7 ug/kg/h via INTRAVENOUS

## 2013-10-19 MED ORDER — FLUCONAZOLE IN SODIUM CHLORIDE 400-0.9 MG/200ML-% IV SOLN
400.0000 mg | INTRAVENOUS | Status: DC
  Filled 2013-10-19: qty 200

## 2013-10-19 MED ORDER — LACTATED RINGERS IV SOLN: INTRAVENOUS | Status: DC

## 2013-10-19 MED ORDER — ASPIRIN 300 MG RE SUPP
300.0000 mg | Freq: Every day | RECTAL | Status: DC
  Filled 2013-10-19 (×9): qty 1

## 2013-10-19 MED ORDER — CALCIUM CHLORIDE 10 % IV SOLN
INTRAVENOUS | Status: DC | PRN
  Administered 2013-10-19: 1000 mg via INTRAVENOUS
  Administered 2013-10-19: 800 mg via INTRAVENOUS
  Administered 2013-10-19: 200 mg via INTRAVENOUS

## 2013-10-19 MED ORDER — ACETAMINOPHEN 160 MG/5ML PO SOLN
1000.0000 mg | Freq: Four times a day (QID) | ORAL | Status: AC
  Administered 2013-10-20 (×2): 1000 mg
  Filled 2013-10-19 (×2): qty 40.6

## 2013-10-19 MED ORDER — VASOPRESSIN 20 UNIT/ML IJ SOLN
0.0400 [IU]/min | INTRAVENOUS | Status: DC
  Filled 2013-10-19: qty 2.5

## 2013-10-19 MED ORDER — MIDAZOLAM HCL 2 MG/2ML IJ SOLN: 2.0000 mg | INTRAMUSCULAR | Status: DC | PRN

## 2013-10-19 MED ORDER — FLUCONAZOLE IN SODIUM CHLORIDE 400-0.9 MG/200ML-% IV SOLN
400.0000 mg | Freq: Once | INTRAVENOUS | Status: AC
  Administered 2013-10-20: 400 mg via INTRAVENOUS
  Filled 2013-10-19: qty 200

## 2013-10-19 MED ORDER — THROMBIN 20000 UNITS EX SOLR
OROMUCOSAL | Status: DC | PRN
  Administered 2013-10-19 (×2): via TOPICAL

## 2013-10-19 MED ORDER — MORPHINE SULFATE 2 MG/ML IJ SOLN
2.0000 mg | INTRAMUSCULAR | Status: DC | PRN
  Administered 2013-10-20: 2 mg via INTRAVENOUS
  Administered 2013-10-20: 4 mg via INTRAVENOUS
  Administered 2013-10-20: 2 mg via INTRAVENOUS
  Administered 2013-10-20: 4 mg via INTRAVENOUS
  Administered 2013-10-20 (×4): 2 mg via INTRAVENOUS
  Administered 2013-10-20: 4 mg via INTRAVENOUS
  Administered 2013-10-21 (×2): 2 mg via INTRAVENOUS
  Administered 2013-10-21: 4 mg via INTRAVENOUS
  Administered 2013-10-21: 2 mg via INTRAVENOUS
  Administered 2013-10-21: 4 mg via INTRAVENOUS
  Administered 2013-10-21 – 2013-10-25 (×19): 2 mg via INTRAVENOUS
  Filled 2013-10-19 (×2): qty 1
  Filled 2013-10-19: qty 2
  Filled 2013-10-19 (×12): qty 1
  Filled 2013-10-19: qty 2
  Filled 2013-10-19 (×5): qty 1
  Filled 2013-10-19 (×2): qty 2
  Filled 2013-10-19: qty 1
  Filled 2013-10-19 (×2): qty 2
  Filled 2013-10-19 (×9): qty 1

## 2013-10-19 MED ORDER — VANCOMYCIN HCL IN DEXTROSE 1-5 GM/200ML-% IV SOLN
1000.0000 mg | Freq: Two times a day (BID) | INTRAVENOUS | Status: DC
Start: 2013-10-19 — End: 2013-10-19
  Filled 2013-10-19 (×2): qty 200

## 2013-10-19 MED ORDER — LACTATED RINGERS IV SOLN
INTRAVENOUS | Status: DC | PRN
  Administered 2013-10-19: 07:00:00 via INTRAVENOUS

## 2013-10-19 MED ORDER — LACTATED RINGERS IV SOLN: 500.0000 mL | Freq: Once | INTRAVENOUS | Status: AC | PRN

## 2013-10-19 MED ORDER — VANCOMYCIN HCL 1000 MG IV SOLR
INTRAVENOUS | Status: DC | PRN
  Administered 2013-10-19: 1000 mg via TOPICAL

## 2013-10-19 MED ORDER — AMIODARONE HCL IN DEXTROSE 360-4.14 MG/200ML-% IV SOLN
INTRAVENOUS | Status: AC
  Administered 2013-10-19: 59.94 mg/h via INTRAVENOUS
  Filled 2013-10-19: qty 200

## 2013-10-19 MED ORDER — SODIUM CHLORIDE 0.9 % IV SOLN: 10.0000 g | INTRAVENOUS | Status: DC | PRN

## 2013-10-19 MED ORDER — FENTANYL CITRATE 0.05 MG/ML IJ SOLN
INTRAMUSCULAR | Status: DC | PRN
  Administered 2013-10-19: 150 ug via INTRAVENOUS
  Administered 2013-10-19: 50 ug via INTRAVENOUS
  Administered 2013-10-19 (×2): 100 ug via INTRAVENOUS
  Administered 2013-10-19 (×3): 50 ug via INTRAVENOUS
  Administered 2013-10-19 (×2): 250 ug via INTRAVENOUS

## 2013-10-19 MED ORDER — ALBUMIN HUMAN 5 % IV SOLN
INTRAVENOUS | Status: DC | PRN
  Administered 2013-10-19 (×5): via INTRAVENOUS

## 2013-10-19 MED ORDER — BISACODYL 10 MG RE SUPP: 10.0000 mg | Freq: Every day | RECTAL | Status: DC

## 2013-10-19 MED ORDER — DOPAMINE-DEXTROSE 3.2-5 MG/ML-% IV SOLN
INTRAVENOUS | Status: DC | PRN
  Administered 2013-10-19: 2 ug/kg/min via INTRAVENOUS

## 2013-10-19 MED ORDER — VECURONIUM BROMIDE 10 MG IV SOLR
INTRAVENOUS | Status: DC | PRN
  Administered 2013-10-19: 10 mg via INTRAVENOUS

## 2013-10-19 MED ORDER — SODIUM CHLORIDE 0.9 % IV SOLN
INTRAVENOUS | Status: DC
  Filled 2013-10-19: qty 1

## 2013-10-19 MED ORDER — THROMBIN 20000 UNITS EX SOLR
CUTANEOUS | Status: AC
  Filled 2013-10-19: qty 20000

## 2013-10-19 MED ORDER — LIDOCAINE HCL (CARDIAC) 20 MG/ML IV SOLN
INTRAVENOUS | Status: DC | PRN
  Administered 2013-10-19: 60 mg via INTRAVENOUS

## 2013-10-19 MED ORDER — INSULIN ASPART 100 UNIT/ML ~~LOC~~ SOLN
0.0000 [IU] | SUBCUTANEOUS | Status: DC
  Administered 2013-10-20 – 2013-10-26 (×13): 2 [IU] via SUBCUTANEOUS

## 2013-10-19 MED ORDER — 0.9 % SODIUM CHLORIDE (POUR BTL) OPTIME
TOPICAL | Status: DC | PRN
  Administered 2013-10-19: 5000 mL

## 2013-10-19 MED ORDER — ROCURONIUM BROMIDE 100 MG/10ML IV SOLN
INTRAVENOUS | Status: DC | PRN
  Administered 2013-10-19: 10 mg via INTRAVENOUS
  Administered 2013-10-19: 40 mg via INTRAVENOUS
  Administered 2013-10-19: 60 mg via INTRAVENOUS
  Administered 2013-10-19: 10 mg via INTRAVENOUS
  Administered 2013-10-19: 20 mg via INTRAVENOUS
  Administered 2013-10-19: 50 mg via INTRAVENOUS
  Administered 2013-10-19: 10 mg via INTRAVENOUS

## 2013-10-19 MED ORDER — SODIUM CHLORIDE 0.9 % IJ SOLN
3.0000 mL | Freq: Two times a day (BID) | INTRAMUSCULAR | Status: DC
  Administered 2013-10-20 – 2013-10-21 (×3): 3 mL via INTRAVENOUS

## 2013-10-19 MED ORDER — MIDAZOLAM HCL 5 MG/5ML IJ SOLN
INTRAMUSCULAR | Status: DC | PRN
  Administered 2013-10-19: 4 mg via INTRAVENOUS
  Administered 2013-10-19 (×2): 2 mg via INTRAVENOUS
  Administered 2013-10-19: 1 mg via INTRAVENOUS
  Administered 2013-10-19: 2 mg via INTRAVENOUS
  Administered 2013-10-19: 3 mg via INTRAVENOUS
  Administered 2013-10-19: 1 mg via INTRAVENOUS
  Administered 2013-10-19 (×2): 2.5 mg via INTRAVENOUS

## 2013-10-19 MED ORDER — VANCOMYCIN HCL IN DEXTROSE 1-5 GM/200ML-% IV SOLN
1000.0000 mg | Freq: Two times a day (BID) | INTRAVENOUS | Status: AC
  Administered 2013-10-19 – 2013-10-20 (×3): 1000 mg via INTRAVENOUS
  Filled 2013-10-19 (×3): qty 200

## 2013-10-19 MED ORDER — POTASSIUM CHLORIDE 10 MEQ/50ML IV SOLN
10.0000 meq | INTRAVENOUS | Status: AC
  Administered 2013-10-19 (×3): 10 meq via INTRAVENOUS

## 2013-10-19 MED ORDER — MORPHINE SULFATE 2 MG/ML IJ SOLN
1.0000 mg | INTRAMUSCULAR | Status: AC | PRN
  Administered 2013-10-20: 4 mg via INTRAVENOUS
  Filled 2013-10-19: qty 1
  Filled 2013-10-19: qty 2

## 2013-10-19 MED ORDER — DOPAMINE-DEXTROSE 3.2-5 MG/ML-% IV SOLN
2.0000 ug/kg/min | INTRAVENOUS | Status: DC
  Administered 2013-10-20: 5 ug/kg/min via INTRAVENOUS
  Administered 2013-10-22: 2 ug/kg/min via INTRAVENOUS
  Filled 2013-10-19 (×2): qty 250

## 2013-10-19 MED ORDER — MUPIROCIN 2 % EX OINT
1.0000 "application " | TOPICAL_OINTMENT | Freq: Two times a day (BID) | CUTANEOUS | Status: AC
  Administered 2013-10-19 – 2013-10-23 (×8): 1 via NASAL

## 2013-10-19 MED ORDER — DEXMEDETOMIDINE HCL IN NACL 400 MCG/100ML IV SOLN
0.4000 ug/kg/h | INTRAVENOUS | Status: DC
  Filled 2013-10-19 (×3): qty 100

## 2013-10-19 MED ORDER — ACETAMINOPHEN 500 MG PO TABS
1000.0000 mg | ORAL_TABLET | Freq: Four times a day (QID) | ORAL | Status: AC
  Administered 2013-10-20 – 2013-10-24 (×15): 1000 mg via ORAL
  Filled 2013-10-19 (×19): qty 2

## 2013-10-19 MED ORDER — RIFAMPIN 300 MG PO CAPS
600.0000 mg | ORAL_CAPSULE | Freq: Once | ORAL | Status: DC
  Filled 2013-10-19: qty 2

## 2013-10-19 MED ORDER — ALBUMIN HUMAN 5 % IV SOLN
250.0000 mL | INTRAVENOUS | Status: AC | PRN
  Administered 2013-10-19 – 2013-10-20 (×2): 250 mL via INTRAVENOUS
  Filled 2013-10-19: qty 250

## 2013-10-19 MED ORDER — DEXTROSE 50 % IV SOLN
INTRAVENOUS | Status: AC
  Administered 2013-10-19: 24 mL
  Filled 2013-10-19: qty 50

## 2013-10-19 MED ORDER — ACETAMINOPHEN 650 MG RE SUPP
650.0000 mg | Freq: Once | RECTAL | Status: AC
  Administered 2013-10-19: 650 mg via RECTAL

## 2013-10-19 MED ORDER — PHENYLEPHRINE HCL 10 MG/ML IJ SOLN
0.0000 ug/min | INTRAVENOUS | Status: DC
  Filled 2013-10-19: qty 2

## 2013-10-19 MED ORDER — INSULIN REGULAR BOLUS VIA INFUSION
0.0000 [IU] | Freq: Three times a day (TID) | INTRAVENOUS | Status: DC
  Filled 2013-10-19: qty 10

## 2013-10-19 MED ORDER — THROMBIN 20000 UNITS EX KIT
PACK | CUTANEOUS | Status: AC
  Filled 2013-10-19: qty 1

## 2013-10-19 MED ORDER — PHENYLEPHRINE HCL 10 MG/ML IJ SOLN
INTRAMUSCULAR | Status: DC | PRN
  Administered 2013-10-19: 40 ug via INTRAVENOUS
  Administered 2013-10-19 (×9): 80 ug via INTRAVENOUS
  Administered 2013-10-19: 40 ug via INTRAVENOUS

## 2013-10-19 MED ORDER — COAGULATION FACTOR VIIA RECOMB 1 MG IV SOLR
INTRAVENOUS | Status: DC | PRN
  Administered 2013-10-19: 1 mg via INTRAVENOUS

## 2013-10-19 MED ORDER — LIDOCAINE HCL (CARDIAC) 20 MG/ML IV SOLN
INTRAVENOUS | Status: DC | PRN
  Administered 2013-10-19: 100 mg via INTRAVENOUS

## 2013-10-19 MED ORDER — PANTOPRAZOLE SODIUM 40 MG PO TBEC
40.0000 mg | DELAYED_RELEASE_TABLET | Freq: Every day | ORAL | Status: DC
  Administered 2013-10-21 – 2013-11-18 (×28): 40 mg via ORAL
  Filled 2013-10-19 (×29): qty 1

## 2013-10-19 MED ORDER — ASPIRIN EC 325 MG PO TBEC
325.0000 mg | DELAYED_RELEASE_TABLET | Freq: Every day | ORAL | Status: DC
  Administered 2013-10-21 – 2013-11-17 (×28): 325 mg via ORAL
  Filled 2013-10-19 (×30): qty 1

## 2013-10-19 MED ORDER — SODIUM CHLORIDE 0.9 % IV SOLN
10.0000 g | INTRAVENOUS | Status: DC | PRN
  Administered 2013-10-19: 5 g/h via INTRAVENOUS

## 2013-10-19 MED ORDER — AMIODARONE HCL IN DEXTROSE 360-4.14 MG/200ML-% IV SOLN
60.0000 mg/h | INTRAVENOUS | Status: AC
  Administered 2013-10-19: 59.94 mg/h via INTRAVENOUS
  Administered 2013-10-19: 60 mg/h via INTRAVENOUS
  Filled 2013-10-19: qty 200

## 2013-10-19 MED ORDER — VANCOMYCIN HCL 1000 MG IV SOLR
1000.0000 mg | INTRAVENOUS | Status: DC
  Filled 2013-10-19: qty 1000

## 2013-10-19 MED ORDER — SODIUM CHLORIDE 0.9 % IV SOLN
20.0000 ug | INTRAVENOUS | Status: DC
  Filled 2013-10-19: qty 5

## 2013-10-19 MED ORDER — SODIUM CHLORIDE 0.9 % IJ SOLN
INTRAMUSCULAR | Status: DC | PRN
  Administered 2013-10-19: 3000 mL via INTRAVENOUS

## 2013-10-19 MED ORDER — AMIODARONE HCL IN DEXTROSE 360-4.14 MG/200ML-% IV SOLN
30.0000 mg/h | INTRAVENOUS | Status: DC
  Administered 2013-10-19 – 2013-10-24 (×11): 30 mg/h via INTRAVENOUS
  Filled 2013-10-19 (×29): qty 200

## 2013-10-19 MED ORDER — MAGNESIUM SULFATE 40 MG/ML IJ SOLN
4.0000 g | Freq: Once | INTRAMUSCULAR | Status: AC
  Administered 2013-10-19: 4 g via INTRAVENOUS
  Filled 2013-10-19: qty 100

## 2013-10-19 MED ORDER — FLUCONAZOLE IN SODIUM CHLORIDE 400-0.9 MG/200ML-% IV SOLN
400.0000 mg | INTRAVENOUS | Status: AC
  Administered 2013-10-19: 400 mg via INTRAVENOUS
  Filled 2013-10-19: qty 200

## 2013-10-19 MED ORDER — NITROGLYCERIN IN D5W 200-5 MCG/ML-% IV SOLN: 0.0000 ug/min | INTRAVENOUS | Status: DC

## 2013-10-19 MED ORDER — OXYCODONE HCL 5 MG PO TABS
5.0000 mg | ORAL_TABLET | ORAL | Status: DC | PRN
  Administered 2013-10-20 – 2013-10-21 (×5): 10 mg via ORAL
  Administered 2013-10-22: 5 mg via ORAL
  Administered 2013-10-24: 10 mg via ORAL
  Administered 2013-10-25: 5 mg via ORAL
  Administered 2013-10-25 – 2013-10-26 (×3): 10 mg via ORAL
  Administered 2013-10-26: 5 mg via ORAL
  Administered 2013-10-26 – 2013-10-27 (×3): 10 mg via ORAL
  Administered 2013-10-27 – 2013-10-28 (×2): 5 mg via ORAL
  Administered 2013-10-29 – 2013-10-30 (×3): 10 mg via ORAL
  Filled 2013-10-19: qty 1
  Filled 2013-10-19 (×5): qty 2
  Filled 2013-10-19 (×2): qty 1
  Filled 2013-10-19 (×2): qty 2
  Filled 2013-10-19: qty 1
  Filled 2013-10-19 (×2): qty 2
  Filled 2013-10-19: qty 1
  Filled 2013-10-19 (×6): qty 2

## 2013-10-19 MED ORDER — ONDANSETRON HCL 4 MG/2ML IJ SOLN
4.0000 mg | Freq: Four times a day (QID) | INTRAMUSCULAR | Status: DC | PRN
  Administered 2013-10-21 – 2013-11-14 (×9): 4 mg via INTRAVENOUS
  Filled 2013-10-19 (×9): qty 2

## 2013-10-19 MED ORDER — PROTAMINE SULFATE 10 MG/ML IV SOLN
INTRAVENOUS | Status: DC | PRN
  Administered 2013-10-19 (×11): 20 mg via INTRAVENOUS
  Administered 2013-10-19: 50 mg via INTRAVENOUS
  Administered 2013-10-19: 10 mg via INTRAVENOUS
  Administered 2013-10-19: 20 mg via INTRAVENOUS

## 2013-10-19 MED ORDER — DEXTROSE 5 % IV SOLN
1.5000 g | Freq: Two times a day (BID) | INTRAVENOUS | Status: AC
  Administered 2013-10-19 – 2013-10-21 (×4): 1.5 g via INTRAVENOUS
  Filled 2013-10-19 (×4): qty 1.5

## 2013-10-19 MED ORDER — VANCOMYCIN HCL IN DEXTROSE 1-5 GM/200ML-% IV SOLN
1000.0000 mg | Freq: Two times a day (BID) | INTRAVENOUS | Status: DC
  Filled 2013-10-19: qty 200

## 2013-10-19 SURGICAL SUPPLY — 127 items
ADAPTER CARDIO PERF ANTE/RETRO (ADAPTER) ×6 IMPLANT
ADAPTER DLP PERFUSION .25INX2I (MISCELLANEOUS) ×3 IMPLANT
ADPR CRDPLG .25X.64 STRL (MISCELLANEOUS) ×1
ADPR PRFSN 84XANTGRD RTRGD (ADAPTER) ×4
ANTEGRADE CPLG (MISCELLANEOUS) ×3 IMPLANT
APPLICATOR CHLORAPREP 3ML ORNG (MISCELLANEOUS) ×3 IMPLANT
ATTRACTOMAT 16X20 MAGNETIC DRP (DRAPES) ×3 IMPLANT
BAG DECANTER FOR FLEXI CONT (MISCELLANEOUS) ×9 IMPLANT
BLADE STERNUM SYSTEM 6 (BLADE) ×3 IMPLANT
BLADE SURG 10 STRL SS (BLADE) ×6 IMPLANT
BLADE SURG 12 STRL SS (BLADE) ×3 IMPLANT
BLADE SURG 15 STRL LF DISP TIS (BLADE) ×2 IMPLANT
BLADE SURG 15 STRL SS (BLADE) ×3
CANISTER SUCTION 2500CC (MISCELLANEOUS) ×3 IMPLANT
CANNULA ARTERIAL NVNT 3/8 20FR (MISCELLANEOUS) ×3 IMPLANT
CANNULA GUNDRY RCSP 15FR (MISCELLANEOUS) ×3 IMPLANT
CANNULA VENOUS LOW PROF 34X46 (CANNULA) ×3 IMPLANT
CATH CPB KIT VANTRIGT (MISCELLANEOUS) IMPLANT
CATH FOLEY 2WAY SLVR  5CC 14FR (CATHETERS) ×1
CATH FOLEY 2WAY SLVR 5CC 14FR (CATHETERS) ×2 IMPLANT
CATH HEART VENT LEFT (CATHETERS) ×4 IMPLANT
CATH HYDRAGLIDE XL THORACIC (CATHETERS) ×6 IMPLANT
CATH ROBINSON RED A/P 18FR (CATHETERS) ×9 IMPLANT
CATH THORACIC 28FR (CATHETERS) ×6 IMPLANT
CATH THORACIC 36FR (CATHETERS) ×3 IMPLANT
CATH THORACIC 36FR RT ANG (CATHETERS) ×3 IMPLANT
CHLORAPREP W/TINT 26ML (MISCELLANEOUS) ×3 IMPLANT
CLIP TI MEDIUM 24 (CLIP) ×3 IMPLANT
CONN 3/8X3/8 GISH STERILE (MISCELLANEOUS) ×3 IMPLANT
CONN ST 1/4X3/8  BEN (MISCELLANEOUS) ×4
CONN ST 1/4X3/8 BEN (MISCELLANEOUS) ×8 IMPLANT
CONN Y 3/8X3/8X3/8  BEN (MISCELLANEOUS) ×1
CONN Y 3/8X3/8X3/8 BEN (MISCELLANEOUS) ×2 IMPLANT
COVER MAYO STAND STRL (DRAPES) ×3 IMPLANT
COVER SURGICAL LIGHT HANDLE (MISCELLANEOUS) ×6 IMPLANT
CRADLE DONUT ADULT HEAD (MISCELLANEOUS) ×3 IMPLANT
DRAIN CHANNEL 28F RND 3/8 FF (WOUND CARE) IMPLANT
DRAIN CHANNEL 32F RND 10.7 FF (WOUND CARE) IMPLANT
DRAPE BILATERAL SPLIT (DRAPES) ×3 IMPLANT
DRAPE CARDIOVASCULAR INCISE (DRAPES) ×3
DRAPE INCISE IOBAN 66X45 STRL (DRAPES) ×3 IMPLANT
DRAPE SLUSH/WARMER DISC (DRAPES) ×3 IMPLANT
DRAPE SRG 135X102X78XABS (DRAPES) ×2 IMPLANT
DRSG AQUACEL AG ADV 3.5X14 (GAUZE/BANDAGES/DRESSINGS) ×3 IMPLANT
DRSG COVADERM 4X14 (GAUZE/BANDAGES/DRESSINGS) ×3 IMPLANT
ELECT BLADE 4.0 EZ CLEAN MEGAD (MISCELLANEOUS) ×6
ELECT BLADE 6.5 EXT (BLADE) ×3 IMPLANT
ELECT CAUTERY BLADE 6.4 (BLADE) ×3 IMPLANT
ELECT PAD GROUND ADT 9 (MISCELLANEOUS) ×6 IMPLANT
ELECT REM PT RETURN 9FT ADLT (ELECTROSURGICAL) ×3
ELECTRODE BLDE 4.0 EZ CLN MEGD (MISCELLANEOUS) ×4 IMPLANT
ELECTRODE REM PT RTRN 9FT ADLT (ELECTROSURGICAL) ×2 IMPLANT
FELT TEFLON 6X6 (MISCELLANEOUS) ×6 IMPLANT
GLOVE BIO SURGEON STRL SZ 6 (GLOVE) ×9 IMPLANT
GLOVE BIO SURGEON STRL SZ 6.5 (GLOVE) ×15 IMPLANT
GLOVE BIOGEL M 6.5 STRL (GLOVE) ×3 IMPLANT
GLOVE BIOGEL PI IND STRL 7.0 (GLOVE) ×8 IMPLANT
GLOVE BIOGEL PI INDICATOR 7.0 (GLOVE) ×4
GLOVE EUDERMIC 7 POWDERFREE (GLOVE) ×15 IMPLANT
GOWN PREVENTION PLUS XLARGE (GOWN DISPOSABLE) ×9 IMPLANT
GOWN STRL NON-REIN LRG LVL3 (GOWN DISPOSABLE) ×24 IMPLANT
HEMOSTAT POWDER SURGIFOAM 1G (HEMOSTASIS) ×12 IMPLANT
HEMOSTAT SURGICEL 2X14 (HEMOSTASIS) ×3 IMPLANT
HM II SYSTEM CONTROLLER ×3 IMPLANT
INSERT FOGARTY 61MM (MISCELLANEOUS) ×3 IMPLANT
INSERT FOGARTY XLG (MISCELLANEOUS) ×3 IMPLANT
KIT BASIN OR (CUSTOM PROCEDURE TRAY) ×3 IMPLANT
KIT HM II VENT ASSIST DEV BP (Prosthesis & Implant Heart) ×3 IMPLANT
KIT ROOM TURNOVER OR (KITS) ×3 IMPLANT
KIT SUCTION CATH 14FR (SUCTIONS) ×3 IMPLANT
LEAD PACING MYOCARDI (MISCELLANEOUS) ×12 IMPLANT
LINE VENT (MISCELLANEOUS) ×3 IMPLANT
NS IRRIG 1000ML POUR BTL (IV SOLUTION) ×15 IMPLANT
PACK OPEN HEART (CUSTOM PROCEDURE TRAY) ×3 IMPLANT
PAD ARMBOARD 7.5X6 YLW CONV (MISCELLANEOUS) ×6 IMPLANT
PAD DEFIB R2 (MISCELLANEOUS) ×3 IMPLANT
PUNCH AORTIC ROTATE 4.5MM 8IN (MISCELLANEOUS) ×3 IMPLANT
SEALANT SURG COSEAL 8ML (VASCULAR PRODUCTS) ×6 IMPLANT
SET CARDIOPLEGIA MPS 5001102 (MISCELLANEOUS) ×3 IMPLANT
SHEATH AVANTI 11CM 5FR (MISCELLANEOUS) IMPLANT
SPONGE GAUZE 4X4 12PLY (GAUZE/BANDAGES/DRESSINGS) ×3 IMPLANT
SPONGE LAP 18X18 X RAY DECT (DISPOSABLE) ×18 IMPLANT
SPONGE LAP 4X18 X RAY DECT (DISPOSABLE) ×3 IMPLANT
STOPCOCK 4 WAY LG BORE MALE ST (IV SETS) ×3 IMPLANT
SUCKER INTRACARDIAC WEIGHTED (SUCKER) ×6 IMPLANT
SUT ETHIBOND 2 0 SH (SUTURE) ×18
SUT ETHIBOND 2 0 SH 36X2 (SUTURE) ×12 IMPLANT
SUT ETHIBOND 5 LR DA (SUTURE) ×9 IMPLANT
SUT ETHIBOND NAB MH 2-0 36IN (SUTURE) ×63 IMPLANT
SUT ETHILON 3 0 FSL (SUTURE) ×3 IMPLANT
SUT PDS AB 1 CTX 36 (SUTURE) ×18 IMPLANT
SUT PDS AB 3-0 SH 27 (SUTURE) ×9 IMPLANT
SUT PROLENE 2 0 MH 48 (SUTURE) ×24 IMPLANT
SUT PROLENE 3 0 SH DA (SUTURE) ×6 IMPLANT
SUT PROLENE 4 0 RB 1 (SUTURE) ×12
SUT PROLENE 4-0 RB1 .5 CRCL 36 (SUTURE) ×12 IMPLANT
SUT PROLENE 5 0 C 1 36 (SUTURE) ×33 IMPLANT
SUT SILK  1 MH (SUTURE) ×6
SUT SILK 1 MH (SUTURE) ×12 IMPLANT
SUT SILK 1 TIES 10X30 (SUTURE) ×6 IMPLANT
SUT SILK 2 0 SH CR/8 (SUTURE) ×6 IMPLANT
SUT SILK 3 0 SH CR/8 (SUTURE) ×3 IMPLANT
SUT STEEL 6MS V (SUTURE) ×3 IMPLANT
SUT STEEL SZ 6 DBL 3X14 BALL (SUTURE) ×3 IMPLANT
SUT TEM PAC WIRE 2 0 SH (SUTURE) ×12 IMPLANT
SUT VIC AB 1 CTX 18 (SUTURE) ×6 IMPLANT
SUT VIC AB 1 CTX 36 (SUTURE) ×15
SUT VIC AB 1 CTX36XBRD ANBCTR (SUTURE) ×10 IMPLANT
SUT VIC AB 2-0 CT1 27 (SUTURE) ×3
SUT VIC AB 2-0 CT1 TAPERPNT 27 (SUTURE) ×2 IMPLANT
SUT VIC AB 2-0 CTX 27 (SUTURE) ×6 IMPLANT
SUT VIC AB 3-0 SH 8-18 (SUTURE) ×6 IMPLANT
SUT VIC AB 3-0 X1 27 (SUTURE) ×9 IMPLANT
SUTURE E-PAK OPEN HEART (SUTURE) IMPLANT
SYR 50ML LL SCALE MARK (SYRINGE) ×3 IMPLANT
SYSTEM SAHARA CHEST DRAIN ATS (WOUND CARE) ×6 IMPLANT
TAPE CLOTH SURG 4X10 WHT LF (GAUZE/BANDAGES/DRESSINGS) ×3 IMPLANT
TAPE PAPER 3X10 WHT MICROPORE (GAUZE/BANDAGES/DRESSINGS) ×3 IMPLANT
TOWEL OR 17X26 10 PK STRL BLUE (TOWEL DISPOSABLE) ×9 IMPLANT
TRAY CATH LUMEN 1 20CM STRL (SET/KITS/TRAYS/PACK) ×3 IMPLANT
TRAY FOLEY IC TEMP SENS 14FR (CATHETERS) ×3 IMPLANT
TUBE CONNECTING 12X1/4 (SUCTIONS) ×3 IMPLANT
TUBE SUCT INTRACARD DLP 20F (MISCELLANEOUS) ×3 IMPLANT
UNDERPAD 30X30 INCONTINENT (UNDERPADS AND DIAPERS) ×3 IMPLANT
VENT LEFT HEART 12002 (CATHETERS) ×6
WATER STERILE IRR 1000ML POUR (IV SOLUTION) ×6 IMPLANT
YANKAUER SUCT BULB TIP NO VENT (SUCTIONS) ×3 IMPLANT

## 2013-10-19 NOTE — Progress Notes (Signed)
Echocardiogram Echocardiogram Transesophageal has been performed.  [name] 10/19/2013, 9:58 AM

## 2013-10-19 NOTE — Progress Notes (Signed)
Increased set rate to 16 per ABG results, and per Dr. Laneta Simmers.

## 2013-10-19 NOTE — OR Nursing (Signed)
First call to SICU made at 1354.  1500 SICU notified that there was some bleeding issues so it would be a little while before coming over to the unit.  1535 SICU called and notified that it would be about 45 minutes before patient would be in the unit.  1612 SICU called with 20 min call.

## 2013-10-19 NOTE — Progress Notes (Signed)
Hypoglycemic Event  CBG: 33   Treatment: D50 IV 25 mL  Symptoms: None  Follow-up CBG: Time:1728 CBG Result:77  Possible Reasons for Event: Medication regimen: OR  Comments/MD notified:Dr. Laneta Simmers notified    [name], Frank Santilli A  Remember to initiate Hypoglycemia Order Set & complete

## 2013-10-19 NOTE — Progress Notes (Signed)
CSW met with family in waiting area to offer support. Patient's family in good spirits and denied any questions at this time. Family verbalize understanding of ICU admission post surgery and state they have been getting updates from team regarding surgery progress. CSW will continue to provide support and any other needs as they arise. Lasandra Beech, LCSW [phone]

## 2013-10-19 NOTE — Progress Notes (Signed)
   I saw patient in OR to help manage hemodynamics and LVAD settings after VAD implant.  Post implant patient with continued bleeding/oozing. Received multiple blood products. PI low (< 2.0) so speed turned down to 8400. CVP 9.   On TEE, inflow cannula looked well positioned. LV was small with septum pulling to left. RV looked good. Patient given volume to keep CVP ~12 with improvement in hemodynamics. Given 1 mg factor VII to help with bleeding.   LVAD speed turned up to 8600 and tolerated well.   All care discussed and coordinated with surgical team.   Total CCT was 50 minutes.  Daniel Bensimhon,MD 5:29 PM

## 2013-10-19 NOTE — Anesthesia Preprocedure Evaluation (Signed)
Anesthesia Evaluation  Patient identified by MRN, date of birth, ID band Patient awake    Reviewed: Allergy & Precautions, H&P , NPO status , Patient's Chart, lab work & pertinent test results  Airway Mallampati: II      Dental   Pulmonary sleep apnea , former smoker,          Cardiovascular hypertension, + angina + CAD, + Past MI and + Peripheral Vascular Disease + dysrhythmias + Cardiac Defibrillator Rhythm:Regular Rate:Normal     Neuro/Psych    GI/Hepatic   Endo/Other  Hypothyroidism   Renal/GU Renal InsufficiencyRenal disease     Musculoskeletal   Abdominal   Peds  Hematology   Anesthesia Other Findings   Reproductive/Obstetrics                           Anesthesia Physical Anesthesia Plan  ASA: IV  Anesthesia Plan: General   Post-op Pain Management:    Induction: Intravenous  Airway Management Planned: Oral ETT  Additional Equipment: Arterial line, PA Cath and TEE  Intra-op Plan:   Post-operative Plan: Post-operative intubation/ventilation  Informed Consent: I have reviewed the patients History and Physical, chart, labs and discussed the procedure including the risks, benefits and alternatives for the proposed anesthesia with the patient or authorized representative who has indicated his/her understanding and acceptance.   Dental advisory given  Plan Discussed with: CRNA and Surgeon  Anesthesia Plan Comments:         Anesthesia Quick Evaluation

## 2013-10-19 NOTE — Progress Notes (Signed)
Hypoglycemic Event  CBG:61  Treatment: D50 IV 25 mL  Symptoms: None  Follow-up CBG: Time:1815 CBG Result:112  Possible Reasons for Event: Medication regimen: OR  Comments/MD notified:Dr. Laneta Simmers notified    [name], [name]  Remember to initiate Hypoglycemia Order Set & complete

## 2013-10-19 NOTE — Op Note (Signed)
CARDIOVASCULAR SURGERY OPERATIVE NOTE  [name] [id number] 10/19/2013   Surgeon:  Alleen Borne, MD  First Assistant: Lovett Sox, MD  Preoperative Diagnosis: Class IV heart failure with LVEF< 20%   Postoperative Diagnosis:  Same   Procedure:   1. Median Sternotomy 2. Extracorporeal circulation 3. Closure of aortic valve. 4.   Implantation of HeartMate II Left Ventricular Assist Device.   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 Recently admitted 12/8-12/11/14 for syncopal episode from inappropriate ATP which accelerated SVT>>VT with failed ATP and then spontaneous termination of VT. During hospital stay had repeat RHC, hemodynamics were stable and had TEE-DC-CV of AFL 10/15/13 which was successful. He was presented to our [facility] team and felt to be good LVAD candidate and then presented at [facility] transplant listing conference and they felt he was not a candidate for transplant, but good candidate for LVAD under DT criteria. He was discharged last Thursday and readmitted yesterday for surgery today. He had a good weekend. He will require aortic valve closure at the time of LVAD implant. I discussed the operative procedure with the patient including alternatives, benefits and risks; including but not limited to bleeding, blood transfusion, infection, stroke, myocardial infarction, thrombosis or failure of the pump requiring replacement, organ dysfunction, and death. [name] understands and agrees to proceed.      Preparation:  The patient was seen in the preoperative holding area and the correct patient, correct operation were confirmed with the  patient after reviewing the medical record and catheterization. The consent was signed by me. Preoperative antibiotics were given. A pulmonary arterial line and radial arterial line were placed by the anesthesia team. The patient was taken back to the operating room and positioned supine on the operating room table. After being placed under general endotracheal anesthesia by the anesthesia team a foley catheter was placed. The neck, chest, abdomen, and both legs were prepped with betadine soap and solution and draped in the usual sterile manner. A surgical time-out was taken and the correct patient and operative procedure were confirmed with the nursing and anesthesia staff.   Pre-bypass and Post-bypass TEE:   Complete TEE assessment was performed by Dr. Aurther Loft Massage. This showed moderate central AI, trivial MR, severe LV dysfunction and mild RV hypokinesis, trivial TR. Post-bypass TEE showed no flow across the aortic valve, trivial MR, good RV function and good inflow cannula position.   Creation of pump pocket:   A median sternotomy was performed. The pericardium was opened in the midline. 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 The LV did not appear dilated or scarred but the muscle was akinetic. Right ventricular function appeared good with PAP was 36/15 with CVP 9. The ascending aorta was of normal size and had some palpable calcified plaque in the aortic root and aortic arch. There were no contraindications to aortic cannulation or cross-clamping. The left side of the sternum with retracted with the Ruh[place]. The left diaphragm was taken down from the anterior chest wall using electrocautery. This was continued laterally as far as possible. A pre-peritoneal plane was developed inferiorly and extended across the mid-line toward the  right. All blood vessels of any size were clipped and divided. The prototype was placed into the pocket to confirm the right  size. Then a 4 cm transverse incision was made to the right of the umbilicus and continued down to the rectus fascia using electrocautery. A skin punch was used to create the drive line exit site about 3 cm below the right costal margin in the anterior axillary line. The straight tunneling device was passed in a subcutaneous plane from the transverse abdominal incision to a point midway to the pericardium and then through the rectus fascia into the pre-peritoneal space. The curved tunneling device was passed from the abdominal incision the the skin exit site.  Cardiopulmonary Bypass:   The patient was fully systemically heparinized and the ACT was maintained > 400 sec. The proximal aortic arch was cannulated with a 20 F aortic cannula for arterial inflow. Venous cannulation was performed via the right atrial appendage using a two-staged venous cannula. An antegrade cardioplegia and vent cannula was placed in the ascending aorta. A left ventricular vent was placed via the right superior pulmonary vein. A retrograde cardioplegia cannula was placed via the right atrium and advanced into the coronary sinus without difficulty. Carbon dioxide was insufflated into the pericardium at 6L/min throughout the procedure to minimize intracardiac air. The systemic temperature was allowed to drift downward slightly during the procedure and the patient was actively rewarmed.   Closure of aortic valve:  The patient was placed on cardiopulmonary bypass. The aorta was cross-clamped and 1500 cc of cold blood antegrade cardioplegia was given with quick arrest of the heart. This was followed by 300 cc of cold blood retrograde cardioplegia. Additional doses of cold blood retrograde cardioplegia were given throughout the period of cardiac arrest at 20 minute intervals. A temperature probe was placed in the interventricular septum.   The aorta was opened obliquely just above the area of aortic root calcification. The aortic valve  leaflets were normal appearing but did not coapt centrally. A 5-0 prolene suture was used to approximate the apex of each leaflet. Then the leaflets were approximated using 2 pledgetted 5-0 prolene sutures for each line of coaptation. The aorta was closed using continuous 4-0 prolene suture in two layers using felt strips to reinforce the closure.   Aortic outflow graft anastomosis:  The outflow graft was cut to the appropriate length. It was placed inside the bend relief.  A vertical midline aortotomy was created and the ends enlarged with a 4.5 mm punch. The outflow graft was anastomosed to the aorta using 4-0 prolene pledgetted sutures at the toe and heal that were run down each side. Coseal was used to seal the needle holes in the graft. The cross clamp was removed and the graft flushed and clamped with a peripheral Debakey clamp.   Insertion of apical outflow cannula:    Laparotomy pads were placed behind the heart to aid exposure of the apex. A site was chosen on the anterior wall lateral to the LAD and superior to the true apex. A stab incision was made and a foley catheter placed through the coring device and into the left ventricle. The balloon was inflated and with gentle traction on the catheter the coring device was carefully advanced to create the ventriculotomy. The balloon was deflated and removed. A sump was placed into the LV and the ventricular core excised. Trabeculations that might cause obstruction were excised. Then a series of 2-0 Ethibond horizontal mattress sutures were placed around  the ventriculotomy. This took 16 sutures. The sutures were placed through the sewing ring on the outflow sleeve. The sutures were tied and the sleeve appeared to be well-sealed to the apex. Coseal was applied to seal needle holes. Then volume was left in the heart and the lungs inflated. The HeartMate II was brought onto the operative field. The inflow cannula was inserted into the apical sleeve and the  sleeve suture tied. The outflow end of the pump was connected to vent suction. A tie band was placed around the apical sleeve followed by two #5 Ethibond ties. The drive line was then tunneled using the previously placed tunneling devices. A loop was placed in the preperitoneal space. The outflow graft was then attached to the pump. A 20 gauge needle was placed into the outflow graft for deairing. The pump was placed into the pocket. There was some bleeding from the anterior surface of the LV where 2 of the pledgetted sutures around the inflow cannula partially tore through the LV. This muscle was friable and not scar at all. This was repaired with additional pledgetted horizontal mattress sutures of 2-0 Ethibond and Prolene.    Weaning from bypass:  The patient was started on Milrinone 0.3 mcg/kg/min, levophed, and nitric oxide at 30 ppm. We ran dopamine at 2 mcg/kg/min throughout the pump run. The HeartMate II pump was started at 6000 rpm. TEE confirmed that the intracardiac air was removed.  Cardiopulmonary bypass flow was decreased to half flow and the clamp removed from the outflow graft. The speed was increased to 7000 rpm. Flow was decreased to 1/4 and the speed increased to 8000. Cardiopulmonary bypass was discontinued and the speed increased to 8400. Pulmonary artery pressures were normal at 30/16 with a CVP of 8. CO was 5 L/min. Cross-clamp time was 57 min. CPB time was 195 minutes.   Completion:  Two temporary epicardial pacing wires were placed on the right atrium. Heparin was fully reversed with protamine and the aortic and venous cannulas removed. The patient had severe coagulopathy and required 6 units PRBC's during the case as well as 4 units of FFP, 3 units of platelets and 1 unit of cryoprecipitate. He still appeared coagulopathic after this and we decided to give him 1 mg of NovoSeven.  Hemostasis was achieved. Mediastinal drainage tubes and bilateral pleural chest tubes were placed with a  drain in the pocket.  The sternum was closed with #6 stainless steel wires. The fascia was closed with continuous # 1 vicryl suture with interrupted # 1 vicryl sutures in the lower incisional fascia. The subcutaneous tissue was closed with 2-0 vicryl continuous suture. The skin was closed with 3-0 vicryl subcuticular suture. The abdominal incision was closed with continuous 3-0 vicryl subcutaneous suture and 3-0 vicryl subcuticular skin suture. The drive line exit site was re-approximated with 3-0 vicryl subcutaneous sutures and 3-0 nylon skin sutures. The drive line fastener was applied. All sponge, needle, and instrument counts were reported correct at the end of the case. Dry sterile dressings were placed over the incisions and around the chest tubes which were connected to pleurevac suction. The patient was then transported to the surgical intensive care unit in critical but stable condition.

## 2013-10-19 NOTE — Progress Notes (Signed)
  Patient doing well.   No further bleeding. Making good urine.   MAP 70s. CVP 11. PA pressures in 30s. CI 2.5. Co-ox 65%   VAD parameters look good on 8600 RPMs.   Hgb 10.1  PLTs 141k Cr 1.24  Continue post-op care.   Truman Hayward 7:36 PM

## 2013-10-19 NOTE — Transfer of Care (Signed)
Immediate Anesthesia Transfer of Care Note  Patient: [name]  Procedure(s) Performed: Procedure(s) with comments: INSERTION OF IMPLANTABLE LEFT VENTRICULAR ASSIST DEVICE (N/A) - CIRC ARREST  NITRIC OXIDE  MEDTRONIC ICD INTRAOPERATIVE TRANSESOPHAGEAL ECHOCARDIOGRAM (N/A)  Patient Location: SICU  Anesthesia Type:General  Level of Consciousness: Patient remains intubated per anesthesia plan  Airway & Oxygen Therapy: Patient remains intubated per anesthesia plan and Patient placed on Ventilator (see vital sign flow sheet for setting)  Post-op Assessment: Post -op Vital signs reviewed and stable  Post vital signs: Reviewed and stable  Complications: No apparent anesthesia complications

## 2013-10-19 NOTE — Plan of Care (Signed)
Problem: Consults Goal: Skin Care Protocol Initiated - if Braden Score 18 or less If consults are not indicated, leave blank or document N/A Outcome: Not Applicable Date Met:  10/19/13 Pt Braden score > 18

## 2013-10-19 NOTE — Progress Notes (Signed)
Unit social worker received consult that patient is a VAD patient. CSW reviewed chart and noticed the VAD social worker is working with the patient. Unit social worker will refer consult onto the VAD social worker. Unit social worker signing off.  Maree Krabbe, MSW, Theresia Majors [phone]

## 2013-10-19 NOTE — Progress Notes (Signed)
Cardiothoracic Surgery Preop Note:  [name] is a 71 yo man with an ischemic CM s/p ICD implant, chronic systolic HF (EF 20-25%), paroxysmal VT s/p ablation (09/2011) and repeat (06/2013), CAD, PVD (s/p R fem pop BPG 01/2010; s/p L CFA BPG 05/2002), AAA (s/p repair 04/2002), CAS (s/p L CEA 2008), HTN, arthritis, and TIA. Recently admitted 12/8-12/11/14 for syncopal episode from inappropriate ATP which accelerated SVT>>VT with failed ATP and then spontaneous termination of VT. During hospital stay had repeat RHC, hemodynamics were stable and had TEE-DC-CV of AFL 10/15/13 which was successful. He was presented to our [facility] team and felt to be good LVAD candidate and then presented at [facility] transplant listing conference and they felt he was not a candidate for transplant, but good candidate for LVAD under DT criteria. He was discharged last Thursday and readmitted yesterday for surgery today. He had a good weekend. He will require aortic valve closure at the time of LVAD implant. I discussed the operative procedure with the patient including alternatives, benefits and risks; including but not limited to bleeding, blood transfusion, infection, stroke, myocardial infarction, thrombosis or failure of the pump requiring replacement, organ dysfunction, and death. [name] understands and agrees to proceed.

## 2013-10-19 NOTE — Brief Op Note (Signed)
10/18/2013 - 10/19/2013  5:53 PM  PATIENT:  Remigio Eisenmenger  72 y.o. male  PRE-OPERATIVE DIAGNOSIS:  ICM  POST-OPERATIVE DIAGNOSIS:  ICM  PROCEDURE:  Procedure(s) with comments: INSERTION OF HeartMate II IMPLANTABLE LEFT VENTRICULAR ASSIST DEVICE    INTRAOPERATIVE TRANSESOPHAGEAL ECHOCARDIOGRAM (N/A)  SURGEON:  Surgeon(s) and Role:    * Alleen Borne, MD - Primary    * Kerin Perna, MD - Assisting  PHYSICIAN ASSISTANT: none  ASSISTANTS: Benay Spice, RNFA and Maudry Diego, RNFA   ANESTHESIA:   general  EBL:  Total I/O In: 7781 [I.V.:1750; Blood:4781; IV Piggyback:1250] Out: 4100 [Urine:1150; Blood:2950]  BLOOD ADMINISTERED: 6 units PRBC, 4 units FFP, 1 unit Cryoprecipitate.   COUNTS:  YES   DICTATION: .Note written in EPIC  PLAN OF CARE: Admit to inpatient   PATIENT DISPOSITION:  ICU - intubated and critically ill.   Delay start of Pharmacological VTE agent (>24hrs) due to surgical blood loss or risk of bleeding: yes

## 2013-10-20 ENCOUNTER — Inpatient Hospital Stay (HOSPITAL_COMMUNITY): Payer: Medicare Other

## 2013-10-20 ENCOUNTER — Encounter (HOSPITAL_COMMUNITY): Payer: Self-pay | Admitting: Surgery

## 2013-10-20 DIAGNOSIS — I2589 Other forms of chronic ischemic heart disease: Secondary | ICD-10-CM

## 2013-10-20 DIAGNOSIS — Z95811 Presence of heart assist device: Secondary | ICD-10-CM

## 2013-10-20 DIAGNOSIS — I5023 Acute on chronic systolic (congestive) heart failure: Secondary | ICD-10-CM

## 2013-10-20 DIAGNOSIS — Z95818 Presence of other cardiac implants and grafts: Secondary | ICD-10-CM

## 2013-10-20 LAB — CARBOXYHEMOGLOBIN
Carboxyhemoglobin: 1.9 % — ABNORMAL HIGH (ref 0.5–1.5)
Methemoglobin: 1.4 % (ref 0.0–1.5)
O2 Saturation: 56.5 %
Total hemoglobin: 8.1 g/dL — ABNORMAL LOW (ref 13.5–18.0)

## 2013-10-20 LAB — POCT I-STAT 3, ART BLOOD GAS (G3+)
Acid-Base Excess: 1 mmol/L (ref 0.0–2.0)
Acid-Base Excess: 1 mmol/L (ref 0.0–2.0)
Acid-Base Excess: 2 mmol/L (ref 0.0–2.0)
Bicarbonate: 26.4 mEq/L — ABNORMAL HIGH (ref 20.0–24.0)
Bicarbonate: 27.1 mEq/L — ABNORMAL HIGH (ref 20.0–24.0)
O2 Saturation: 99 %
O2 Saturation: 97 %
O2 Saturation: 95 %
Patient temperature: 37.2
TCO2: 28 mmol/L (ref 0–100)
TCO2: 27 mmol/L (ref 0–100)
pH, Arterial: 7.406 (ref 7.350–7.450)
pH, Arterial: 7.396 (ref 7.350–7.450)
pO2, Arterial: 96 mmHg (ref 80.0–100.0)

## 2013-10-20 LAB — PREPARE FRESH FROZEN PLASMA
Unit division: 0
Unit division: 0
Unit division: 0

## 2013-10-20 LAB — PREPARE CRYOPRECIPITATE: Unit division: 0

## 2013-10-20 LAB — PREPARE PLATELET PHERESIS
Unit division: 0
Unit division: 0
Unit division: 0
Unit division: 0

## 2013-10-20 LAB — CBC
HCT: 23.3 % — ABNORMAL LOW (ref 39.0–52.0)
HCT: 25.1 % — ABNORMAL LOW (ref 39.0–52.0)
Hemoglobin: 8.3 g/dL — ABNORMAL LOW (ref 13.0–17.0)
Hemoglobin: 8.6 g/dL — ABNORMAL LOW (ref 13.0–17.0)
MCH: 30.6 pg (ref 26.0–34.0)
MCHC: 35.6 g/dL (ref 30.0–36.0)
MCHC: 34.3 g/dL (ref 30.0–36.0)
MCV: 87.6 fL (ref 78.0–100.0)
MCV: 89.3 fL (ref 78.0–100.0)
Platelets: 108 10*3/uL — ABNORMAL LOW (ref 150–400)
RBC: 2.66 MIL/uL — ABNORMAL LOW (ref 4.22–5.81)
RBC: 2.81 MIL/uL — ABNORMAL LOW (ref 4.22–5.81)
RDW: 19 % — ABNORMAL HIGH (ref 11.5–15.5)
WBC: 10.1 10*3/uL (ref 4.0–10.5)
WBC: 11.3 10*3/uL — ABNORMAL HIGH (ref 4.0–10.5)

## 2013-10-20 LAB — DIC (DISSEMINATED INTRAVASCULAR COAGULATION)PANEL
D-Dimer, Quant: 2.63 ug/mL-FEU — ABNORMAL HIGH (ref 0.00–0.48)
Fibrinogen: 384 mg/dL (ref 204–475)
INR: 1.83 — ABNORMAL HIGH (ref 0.00–1.49)
Platelets: 97 10*3/uL — ABNORMAL LOW (ref 150–400)
Prothrombin Time: 20.6 seconds — ABNORMAL HIGH (ref 11.6–15.2)
aPTT: 44 seconds — ABNORMAL HIGH (ref 24–37)

## 2013-10-20 LAB — CBC WITH DIFFERENTIAL/PLATELET
Basophils Relative: 0 % (ref 0–1)
Eosinophils Absolute: 0.1 10*3/uL (ref 0.0–0.7)
Lymphocytes Relative: 10 % — ABNORMAL LOW (ref 12–46)
Lymphs Abs: 1.2 10*3/uL (ref 0.7–4.0)
MCH: 31 pg (ref 26.0–34.0)
MCHC: 34.3 g/dL (ref 30.0–36.0)
Neutrophils Relative %: 73 % (ref 43–77)
Platelets: 126 10*3/uL — ABNORMAL LOW (ref 150–400)
RBC: 2.58 MIL/uL — ABNORMAL LOW (ref 4.22–5.81)
WBC: 12.1 10*3/uL — ABNORMAL HIGH (ref 4.0–10.5)

## 2013-10-20 LAB — BASIC METABOLIC PANEL
BUN: 19 mg/dL (ref 6–23)
Calcium: 8.2 mg/dL — ABNORMAL LOW (ref 8.4–10.5)
GFR calc Af Amer: 56 mL/min — ABNORMAL LOW (ref >90)
GFR calc non Af Amer: 48 mL/min — ABNORMAL LOW (ref >90)
Glucose, Bld: 129 mg/dL — ABNORMAL HIGH (ref 70–99)
Sodium: 139 mEq/L (ref 135–145)

## 2013-10-20 LAB — GLUCOSE, CAPILLARY
Glucose-Capillary: 111 mg/dL — ABNORMAL HIGH (ref 70–99)
Glucose-Capillary: 115 mg/dL — ABNORMAL HIGH (ref 70–99)
Glucose-Capillary: 128 mg/dL — ABNORMAL HIGH (ref 70–99)
Glucose-Capillary: 130 mg/dL — ABNORMAL HIGH (ref 70–99)
Glucose-Capillary: 137 mg/dL — ABNORMAL HIGH (ref 70–99)
Glucose-Capillary: 138 mg/dL — ABNORMAL HIGH (ref 70–99)
Glucose-Capillary: 156 mg/dL — ABNORMAL HIGH (ref 70–99)
Glucose-Capillary: 77 mg/dL (ref 70–99)

## 2013-10-20 LAB — POCT I-STAT, CHEM 8
BUN: 18 mg/dL (ref 6–23)
Calcium, Ion: 1.16 mmol/L (ref 1.13–1.30)
Creatinine, Ser: 1.5 mg/dL — ABNORMAL HIGH (ref 0.50–1.35)
HCT: 25 % — ABNORMAL LOW (ref 39.0–52.0)
Potassium: 4.6 mEq/L (ref 3.5–5.1)
Sodium: 136 mEq/L (ref 135–145)
TCO2: 25 mmol/L (ref 0–100)

## 2013-10-20 LAB — PREPARE RBC (CROSSMATCH)

## 2013-10-20 LAB — HEMOGLOBIN AND HEMATOCRIT, BLOOD
HCT: 25.1 % — ABNORMAL LOW (ref 39.0–52.0)
Hemoglobin: 8.5 g/dL — ABNORMAL LOW (ref 13.0–17.0)

## 2013-10-20 LAB — PROTIME-INR: INR: 1.49 (ref 0.00–1.49)

## 2013-10-20 LAB — PHOSPHORUS: Phosphorus: 3.1 mg/dL (ref 2.3–4.6)

## 2013-10-20 LAB — DIC (DISSEMINATED INTRAVASCULAR COAGULATION) PANEL: Smear Review: NONE SEEN

## 2013-10-20 LAB — MAGNESIUM
Magnesium: 2.4 mg/dL (ref 1.5–2.5)
Magnesium: 2.2 mg/dL (ref 1.5–2.5)

## 2013-10-20 LAB — CREATININE, SERUM
Creatinine, Ser: 1.43 mg/dL — ABNORMAL HIGH (ref 0.50–1.35)
GFR calc non Af Amer: 47 mL/min — ABNORMAL LOW (ref >90)

## 2013-10-20 LAB — PRO B NATRIURETIC PEPTIDE: Pro B Natriuretic peptide (BNP): 3914 pg/mL — ABNORMAL HIGH (ref 0–125)

## 2013-10-20 MED ORDER — ALBUMIN HUMAN 5 % IV SOLN
12.5000 g | Freq: Once | INTRAVENOUS | Status: AC
  Administered 2013-10-20: 12.5 g via INTRAVENOUS

## 2013-10-20 MED ORDER — FUROSEMIDE 10 MG/ML IJ SOLN
40.0000 mg | Freq: Once | INTRAMUSCULAR | Status: AC
  Administered 2013-10-20: 40 mg via INTRAVENOUS

## 2013-10-20 MED ORDER — MILRINONE IN DEXTROSE 20 MG/100ML IV SOLN: 0.2500 ug/kg/min | INTRAVENOUS | Status: DC

## 2013-10-20 MED FILL — Heparin Sodium (Porcine) Inj 1000 Unit/ML: INTRAMUSCULAR | Qty: 10 | Status: AC

## 2013-10-20 MED FILL — Sodium Chloride IV Soln 0.9%: INTRAVENOUS | Qty: 3000 | Status: AC

## 2013-10-20 MED FILL — Sodium Chloride IV Soln 0.9%: INTRAVENOUS | Qty: 1000 | Status: AC

## 2013-10-20 MED FILL — Magnesium Sulfate Inj 50%: INTRAMUSCULAR | Qty: 10 | Status: AC

## 2013-10-20 MED FILL — Mannitol IV Soln 20%: INTRAVENOUS | Qty: 500 | Status: AC

## 2013-10-20 MED FILL — Sodium Bicarbonate IV Soln 8.4%: INTRAVENOUS | Qty: 50 | Status: AC

## 2013-10-20 MED FILL — Potassium Chloride Inj 2 mEq/ML: INTRAVENOUS | Qty: 40 | Status: AC

## 2013-10-20 MED FILL — Heparin Sodium (Porcine) Inj 1000 Unit/ML: INTRAMUSCULAR | Qty: 30 | Status: AC

## 2013-10-20 MED FILL — Electrolyte-R (PH 7.4) Solution: INTRAVENOUS | Qty: 5000 | Status: AC

## 2013-10-20 MED FILL — Lidocaine HCl IV Inj 20 MG/ML: INTRAVENOUS | Qty: 5 | Status: AC

## 2013-10-20 NOTE — Progress Notes (Signed)
SICU vent wean protocol started, RN aware.

## 2013-10-20 NOTE — Progress Notes (Signed)
PT/OT Cancellation Note  Patient Details Name: [name] MRN: [id number] DOB: [birth year]   Cancelled Treatment:    Reason Eval/Treat Not Completed: Medical issues which prohibited therapy.  Pt order to start POD #2.  Nursing confirmed that PT/OT  to start tomorrow - 12/17.  Will return tomorrow and proceed as able.  Thanks.   [name],Anavey Coombes 10/20/2013, 9:35 AM [facility] [phone] (pager)

## 2013-10-20 NOTE — Progress Notes (Signed)
Agree. Patient well known to me. Discussed extensively at [facility] meetings. Presented at [facility] listing conference and approved for VAD implantation under DT criteria.   Truman Hayward 2:39 PM

## 2013-10-20 NOTE — Progress Notes (Signed)
Patient ID: [name], male   DOB: 27-Sep-1941, 72 y.o.   MRN: [id number] HeartMate 2 Rounding Note  Subjective:    [name] is a 72 yo man with an ischemic CM s/p ICD implant, chronic systolic HF (EF 20-25%), paroxysmal VT s/p ablation (09/2011) and repeat (06/2013), CAD, PVD (s/p R fem pop BPG 01/2010; s/p L CFA BPG 05/2002), AAA (s/p repair 04/2002), CAS (s/p L CEA 2008), HTN, arthritis, and TIA. Recently admitted 12/8-12/11/14 for syncopal episode from inappropriate ATP which accelerated SVT>>VT with failed ATP and then spontaneous termination of VT. During hospital stay had repeat RHC, hemodynamics were stable and had TEE-DC-CV of AFL 10/15/13 which was successful. He was presented to our [facility] team and felt to be good LVAD candidate and then presented at [facility] transplant listing conference and they felt he was not a candidate for transplant, but good candidate for LVAD under DT criteria.  NO was weaned overnight and he was extubated to NO by Routt at 1 ppm.  Co-ox 56.5 No further arrhythmias   Still putting out 100-200 per hr from chest tubes. The mediastinal drainage is thicker but not clotting. The pleural drainage is serosanguinous. He received 2 units prbcs today. Hgb 8.0 this am and 8.6 this pm. Overall -577 cc today.  LVAD INTERROGATION:  HeartMate II LVAD:  Flow 3.8 liters/min, speed 8600, power 4.0, PI 3.6.  Only a couple PI events overnight associated with weaning levophed and decrease in filling pressures.  Objective:    Vital Signs:   Temp:  [98.4 F (36.9 C)-100.4 F (38 C)] 98.6 F (37 C) (12/16 1730) Pulse Rate:  [35-107] 86 (12/16 1730) Resp:  [0-23] 16 (12/16 1730) BP: (77-95)/(68-76) 85/71 mmHg (12/16 1140) SpO2:  [93 %-100 %] 95 % (12/16 1730) Arterial Line BP: (58-343)/(51-182) 89/71 mmHg (12/16 1730) FiO2 (%):  [40 %-70 %] 40 % (12/16 1042) Weight:  [92.2 kg (203 lb 4.2 oz)] 92.2 kg (203 lb 4.2 oz) (12/16 0500) Last BM Date: 10/18/13 Mean arterial Pressure  75-80 CVP 9 PA 33/17 CI 2.0  On dop 5, levophed 6. amio 30/hr  Intake/Output:   Intake/Output Summary (Last 24 hours) at 10/20/13 1755 Last data filed at 10/20/13 1700  Gross per 24 hour  Intake 4768.69 ml  Output   4860 ml  Net -91.31 ml     Physical Exam: General:  Well appearing. No resp difficulty HEENT: normal Neck: supple.  Cor: LVAD hum present. No native valve sounds Lungs: clear Abdomen: soft, nontender, nondistended. No hepatosplenomegaly. No bruits or masses. Few bowel sounds. Extremities: mild edema Neuro: alert & orientedx3, cranial nerves grossly intact. moves all 4 extremities w/o difficulty. Affect pleasant  Telemetry: A-paced 80  Labs: Basic Metabolic Panel:  Recent Labs Lab 10/15/13 0444 10/18/13 1255 10/19/13 0450  10/19/13 1707 10/19/13 1710 10/19/13 2240 10/19/13 2242 10/20/13 0400 10/20/13 1623 10/20/13 1643  NA 138 139 138  < > 145 144  --  140 139 136  --   K 4.2 5.1 4.2  < > 3.5 3.5  --  4.5 5.2* 4.6  --   CL 104 103 104  --  105 109  --  105 106 108  --   CO2 26 25 26   --   --  28  --   --  26  --   --   GLUCOSE 97 91 99  < > 39* 39*  --  152* 129* 156*  --   BUN 24* 27* 24*  --  18 18  --  18 19 18   --   CREATININE 1.46* 1.56* 1.57*  --  1.30 1.24 1.31 1.40* 1.39* 1.50* 1.43*  CALCIUM 8.5 9.0 8.8  --   --  8.5  --   --  8.2*  --   --   MG  --   --   --   --   --  2.0 2.8*  --  2.4  --  2.2  PHOS  --   --   --   --   --   --   --   --  3.1  --   --   < > = values in this interval not displayed.  Liver Function Tests:  Recent Labs Lab 10/20/13 0400  AST 233*  ALT 49  BILITOT 2.5*   No results found for this basename: LIPASE, AMYLASE,  in the last 168 hours No results found for this basename: AMMONIA,  in the last 168 hours  CBC:  Recent Labs Lab 10/19/13 1710 10/19/13 2240 10/19/13 2242 10/20/13 0400 10/20/13 1150 10/20/13 1502 10/20/13 1623 10/20/13 1643  WBC 10.3 12.5*  --  12.1* 10.1  --   --  11.3*   NEUTROABS  --   --   --  8.8*  --   --   --   --   HGB 9.1* 8.2* 9.9* 8.0* 8.3*  --  8.5* 8.6*  HCT 26.6* 23.7* 29.0* 23.3* 23.3*  --  25.0* 25.1*  MCV 91.7 90.8  --  90.3 87.6  --   --  89.3  PLT 141* 120*  --  126* 108* 97*  --  93*    INR:  Recent Labs Lab 10/18/13 1255 10/19/13 1440 10/19/13 1700 10/20/13 0400 10/20/13 1502  INR 1.24 1.74* 1.15 1.49 1.83*   LDH 659  This pm INR is 1.83, fibrinogen is 384, PTT 44  Other results:  EKG:   Imaging: Dg Chest Port 1 View  10/20/2013   CLINICAL DATA:  LVAD bleeding  EXAM: PORTABLE CHEST - 1 VIEW  COMPARISON:  10/20/2013  FINDINGS: A defibrillator is again noted. A right-sided central venous line and right-sided Swan-Ganz catheter are again noted and stable. The tip of the Swan-Ganz catheter is noted in the pulmonary outflow tract directed towards the right pulmonary artery mediastinal drain and bilateral thoracostomy catheters are again seen. No pneumothorax is noted. Persistent left retro cardiac density is seen. Left ventricular assist device is again identified and unchanged. Endotracheal tube has been removed in the interval as has a nasogastric catheter.  IMPRESSION: Persistent changes in the left retrocardiac region with slight increase in the degree of pleural fluid.  Support apparatus as described stable in appearance.   Electronically Signed   By: Alcide Clever M.D.   On: 10/20/2013 16:36   Dg Chest Port 1 View  10/20/2013   CLINICAL DATA:  Patient is status post LVAD  EXAM: PORTABLE CHEST - 1 VIEW  COMPARISON:  10/22/2013 at  FINDINGS: The cardiac silhouette is enlarged. Chest tubes are again appreciated within the right lung apices unchanged. There is no evidence of appreciable pneumothorax. A Swan-Ganz catheter is appreciated with tip projecting region the main pulmonary outflow tract. A right internal jugular catheter is identified with tip projected regions superior vena cava. A left-sided AICD unit is appreciated with  lead tip projecting region right atrium the right ventricle. A left ventricular assist device is once again appreciated position unchanged. Mediastinal drain is again of  appreciated unchanged in position. An NG tube is seen within the lung view study. There is mild prominence of the interstitial markings unchanged compared to previous study. No focal regions consolidation identified. The osseous structures unremarkable. Stable chest is appreciated within the left lung base. The patient is status post median sternotomy.  IMPRESSION: Support lines and tubes unchanged which appear to be adequately positioned. Stable interstitial findings no evidence of acute abnormalities.   Electronically Signed   By: Salome Holmes M.D.   On: 10/20/2013 08:22   Dg Chest Port 1 View  10/19/2013   CLINICAL DATA:  Postoperative chest. Left ventricular assist device placement.  EXAM: PORTABLE CHEST - 1 VIEW  COMPARISON:  10/18/2013.  FINDINGS: Endotracheal tube tip 8 cm above the carina. NG tube below left hemidiaphragm. Swan-Ganz catheter tip projected over the main pulmonary outflow tract. Right IJ catheter projected over upper superior vena cava. Mediastinal catheter noted in place. Left chest tube noted in good anatomic position. Right chest tube noted in good anatomic position. No pneumothorax. Cardiac pacer with lead tips in right atrium and right ventricle. Left ventricular assist device noted.  Cardiomegaly. No pulmonary venous congestion or pleural effusion. No pneumothorax. Mild interstitial prominence again noted. A mild component of interstitial edema cannot be excluded.  IMPRESSION: 1. Left ventricular assist device placement. Good anatomic positioning of support lines and tubes. 2. Mild bilateral pulmonary interstitial prominence.   Electronically Signed   By: Maisie Fus  Register   On: 10/19/2013 18:07      Medications:     Scheduled Medications: . acetaminophen  1,000 mg Oral Q6H   Or  . acetaminophen (TYLENOL)  oral liquid 160 mg/5 mL  1,000 mg Per Tube Q6H  . aspirin EC  325 mg Oral Daily   Or  . aspirin  324 mg Per Tube Daily   Or  . aspirin  300 mg Rectal Daily  . bisacodyl  10 mg Oral Daily   Or  . bisacodyl  10 mg Rectal Daily  . cefUROXime (ZINACEF)  IV  1.5 g Intravenous Q12H  . Chlorhexidine Gluconate Cloth  6 each Topical Daily  . coagulation factor VIIa recomb  90 mcg/kg Intravenous Once  . docusate sodium  200 mg Oral Daily  . insulin aspart  0-24 Units Subcutaneous Q4H  . mupirocin ointment  1 application Nasal BID  . [START ON 10/21/2013] pantoprazole  40 mg Oral Daily  . rifampin  600 mg Oral Once  . sodium chloride  3 mL Intravenous Q12H  . vancomycin  1,000 mg Intravenous Q12H     Infusions: . sodium chloride    . sodium chloride 20 mL/hr at 10/19/13 1700  . sodium chloride 250 mL (10/20/13 0600)  . amiodarone (NEXTERONE PREMIX) 360 mg/200 mL dextrose 30 mg/hr (10/20/13 1300)  . dexmedetomidine Stopped (10/20/13 0930)  . dexmedetomidine    . DOPamine 5 mcg/kg/min (10/20/13 1747)  . lactated ringers 20 mL/hr at 10/19/13 1700  . milrinone Stopped (10/20/13 0915)  . nitroGLYCERIN Stopped (10/19/13 1700)  . norepinephrine (LEVOPHED) Adult infusion 6 mcg/min (10/20/13 1700)  . phenylephrine (NEO-SYNEPHRINE) Adult infusion Stopped (10/19/13 1700)     PRN Medications:  midazolam, morphine injection, ondansetron (ZOFRAN) IV, oxyCODONE, sodium chloride   Assessment:   1) Chronic systolic HF, Class IV, EF 20-25%  --s/p HM II VAD implant 12/15  2) NICM/ICM  3) Recurrent Vtach  - ablation 09/2011 and 06/2013  4) Atrial flutter  - s/p DC-CV 10/15/2013  5) CAD with  recanalized occluded RCA with left to right collat. 6) PAD: s/p aorto-bifem, bilateral renal artery bypass, bilateral fem-pop, and left CEA.    Plan/Discussion:   POD 1:  He remains hemodynamically stable on current inotropes/vasopressors. Will continue these for now. He did not tolerate starting  Milrinone today with drop in filling pressures, MAP, PI. Therefore it was stopped. Will maintain current drugs and hopefully wean levophed over the next day or two as BP rises.  Chest tube output is still significant but it is not clotting so I don't think there is active significant bleeding. He could still be oozing from the raw surface in the mediastinum especially with periop coagulopathy. Will continue to observe for now.    I reviewed the LVAD parameters from today, and compared the results to the patient's prior recorded data.  No programming changes were made.  The LVAD is functioning within specified parameters.  The patient performs LVAD self-test daily.  LVAD interrogation was negative for any significant power changes, alarms or PI events/speed drops.  LVAD equipment check completed and is in good working order.  Back-up equipment present.   LVAD education done on emergency procedures and precautions and reviewed exit site care.  Length of Stay: 2  Phyllis Abelson K 10/20/2013, 5:55 PM

## 2013-10-20 NOTE — Progress Notes (Signed)
OT Cancellation Note  Patient Details Name: BRISTON LAX MRN: [id number] DOB: 08/07/1941   Cancelled Treatment:    Reason Eval/Treat Not Completed: Other (comment). Order says to start POD2 which is tomorrow.  Evette Georges [phone] 10/20/2013, 10:28 AM

## 2013-10-20 NOTE — Procedures (Signed)
Extubation Procedure Note  Patient Details:   Name: [name] DOB: September 14, 1941 MRN: [id number]   Airway Documentation:   NIF= -30, VC 1.3L, pt able to life head off pillow x 3 seconds, pt follows commands.    Evaluation  O2 sats: stable throughout Complications: No apparent complications Patient did tolerate procedure well. Bilateral Breath Sounds: Rhonchi;Diminished;Clear Suctioning: Airway Yes, pt able to speak.  NO stridor noted, no distress noted.  Pt placed on 4 lpm Angelica w/ nitric at 0.5, RN aware.     Jennette Kettle 10/20/2013, 12:18 PM

## 2013-10-20 NOTE — Progress Notes (Signed)
Advanced Heart Failure Rounding Note  Primary EP: Dr. Graciela Husbands  Primary Physician: Dr. [name] ([place], [place])  Reason for Consultation: Heart failure with recurrent VT (syncope)   Subjective:    POD #1 HM II VAD placement.   Remains intubated. Did well overnight. No bleeding. Nitric weaned down to 2. Remains on EPI and levophed.   MAPs in 80s.  Swan #s CVP 12-14 PA 29/17 CI 2.1  VAD interrogated personally Speed 8600 Flow 3.8 Power 4.0 PI 2.7 Several PI event from last night  Tele: A paced   Objective:   Weight Range:  Vital Signs:   Temp:  [98.6 F (37 C)-100.4 F (38 C)] 98.8 F (37.1 C) (12/16 1230) Pulse Rate:  [35-112] 76 (12/16 1200) Resp:  [11-19] 12 (12/16 1230) BP: (77-95)/(68-76) 85/71 mmHg (12/16 1140) SpO2:  [93 %-100 %] 98 % (12/16 1200) Arterial Line BP: (67-343)/(64-182) 67/65 mmHg (12/16 1230) FiO2 (%):  [40 %-70 %] 40 % (12/16 1042) Weight:  [92.2 kg (203 lb 4.2 oz)] 92.2 kg (203 lb 4.2 oz) (12/16 0500) Last BM Date: 10/18/13  Weight change: Filed Weights   10/18/13 1100 10/19/13 0300 10/20/13 0500  Weight: 85.3 kg (188 lb 0.8 oz) 85.3 kg (188 lb 0.8 oz) 92.2 kg (203 lb 4.2 oz)    Intake/Output:   Intake/Output Summary (Last 24 hours) at 10/20/13 1246 Last data filed at 10/20/13 1200  Gross per 24 hour  Intake 12407.43 ml  Output   7870 ml  Net 4537.43 ml     Physical Exam: General:  Sedated, orally intubated. HEENT: normal Neck:  No lymphadenopathy or thryomegaly appreciated. +swan Cor:  Regular rate & regular rhythm. LVAD hum present.  Lungs: clear, breath sounds diminished bilaterally Abdomen: soft, nontender, distended. Right lower abdomen VAD drive line dressing dry/intact with attachment device present.  Extremities: no cyanosis, clubbing, rash. Hands edematous; extremities cool. 2+ edema Neuro: sedated. Grossly normal  Telemetry: Atrial paced 70 - 80s  VAD parameters: Flow: 3.7 Speed:  8600 PI:  5.6 Power:  4.2 Alarms: none Events:  PI events x 3 Fixed speed:  8600 Low speed limit:  8000  Labs: Basic Metabolic Panel:  Recent Labs Lab 10/15/13 0444 10/18/13 1255 10/19/13 0450  10/19/13 1418 10/19/13 1507 10/19/13 1707 10/19/13 1710 10/19/13 2240 10/19/13 2242 10/20/13 0400  NA 138 139 138  < > 140 143 145 144  --  140 139  K 4.2 5.1 4.2  < > 4.5 3.7 3.5 3.5  --  4.5 5.2*  CL 104 103 104  --   --   --  105 109  --  105 106  CO2 26 25 26   --   --   --   --  28  --   --  26  GLUCOSE 97 91 99  < > 150*  --  39* 39*  --  152* 129*  BUN 24* 27* 24*  --   --   --  18 18  --  18 19  CREATININE 1.46* 1.56* 1.57*  --   --   --  1.30 1.24 1.31 1.40* 1.39*  CALCIUM 8.5 9.0 8.8  --   --   --   --  8.5  --   --  8.2*  MG  --   --   --   --   --   --   --  2.0 2.8*  --  2.4  PHOS  --   --   --   --   --   --   --   --   --   --  3.1  < > = values in this interval not displayed.  Liver Function Tests:  Recent Labs Lab 10/20/13 0400  AST 233*  ALT 49  BILITOT 2.5*   No results found for this basename: LIPASE, AMYLASE,  in the last 168 hours No results found for this basename: AMMONIA,  in the last 168 hours  CBC:  Recent Labs Lab 10/19/13 1500  10/19/13 1700  10/19/13 1710 10/19/13 2240 10/19/13 2242 10/20/13 0400 10/20/13 1150  WBC 5.3  --   --   --  10.3 12.5*  --  12.1* 10.1  NEUTROABS  --   --   --   --   --   --   --  8.8*  --   HGB 8.9*  < >  --   < > 9.1* 8.2* 9.9* 8.0* 8.3*  HCT 26.3*  < >  --   < > 26.6* 23.7* 29.0* 23.3* 23.3*  MCV 92.3  --   --   --  91.7 90.8  --  90.3 87.6  PLT 132*  --  136*  --  141* 120*  --  126* PENDING  < > = values in this interval not displayed.  Cardiac Enzymes: No results found for this basename: CKTOTAL, CKMB, CKMBINDEX, TROPONINI,  in the last 168 hours  BNP: BNP (last 3 results)  Recent Labs  09/15/13 0750 10/12/13 0951 10/20/13 0400  PROBNP 3038.0* 3404.0* 3914.0*    Imaging: Dg Chest 2 View  10/18/2013   CLINICAL  DATA:  Preop evaluation  EXAM: CHEST  2 VIEW  COMPARISON:  10/12/2013  FINDINGS: Cardiac silhouette is enlarged. A left-sided chest wall AICD unit is appreciated with lead tips projecting region of the right atrium and right ventricle. There is no evidence of focal infiltrates, effusions, nor edema. The osseous structures unremarkable.  IMPRESSION: Cardiomegaly without evidence of acute cardiopulmonary disease.   Electronically Signed   By: Salome Holmes M.D.   On: 10/18/2013 13:38   Dg Chest Port 1 View  10/20/2013   CLINICAL DATA:  Patient is status post LVAD  EXAM: PORTABLE CHEST - 1 VIEW  COMPARISON:  10/22/2013 at  FINDINGS: The cardiac silhouette is enlarged. Chest tubes are again appreciated within the right lung apices unchanged. There is no evidence of appreciable pneumothorax. A Swan-Ganz catheter is appreciated with tip projecting region the main pulmonary outflow tract. A right internal jugular catheter is identified with tip projected regions superior vena cava. A left-sided AICD unit is appreciated with lead tip projecting region right atrium the right ventricle. A left ventricular assist device is once again appreciated position unchanged. Mediastinal drain is again of appreciated unchanged in position. An NG tube is seen within the lung view study. There is mild prominence of the interstitial markings unchanged compared to previous study. No focal regions consolidation identified. The osseous structures unremarkable. Stable chest is appreciated within the left lung base. The patient is status post median sternotomy.  IMPRESSION: Support lines and tubes unchanged which appear to be adequately positioned. Stable interstitial findings no evidence of acute abnormalities.   Electronically Signed   By: Salome Holmes M.D.   On: 10/20/2013 08:22   Dg Chest Port 1 View  10/19/2013   CLINICAL DATA:  Postoperative chest. Left ventricular assist device placement.  EXAM: PORTABLE CHEST - 1 VIEW   COMPARISON:  10/18/2013.  FINDINGS: Endotracheal tube tip 8 cm above the carina. NG tube below left hemidiaphragm. Swan-Ganz catheter tip projected over the main  pulmonary outflow tract. Right IJ catheter projected over upper superior vena cava. Mediastinal catheter noted in place. Left chest tube noted in good anatomic position. Right chest tube noted in good anatomic position. No pneumothorax. Cardiac pacer with lead tips in right atrium and right ventricle. Left ventricular assist device noted.  Cardiomegaly. No pulmonary venous congestion or pleural effusion. No pneumothorax. Mild interstitial prominence again noted. A mild component of interstitial edema cannot be excluded.  IMPRESSION: 1. Left ventricular assist device placement. Good anatomic positioning of support lines and tubes. 2. Mild bilateral pulmonary interstitial prominence.   Electronically Signed   By: Maisie Fus  Register   On: 10/19/2013 18:07     Medications:     Scheduled Medications: . acetaminophen  1,000 mg Oral Q6H   Or  . acetaminophen (TYLENOL) oral liquid 160 mg/5 mL  1,000 mg Per Tube Q6H  . aspirin EC  325 mg Oral Daily   Or  . aspirin  324 mg Per Tube Daily   Or  . aspirin  300 mg Rectal Daily  . bisacodyl  10 mg Oral Daily   Or  . bisacodyl  10 mg Rectal Daily  . cefUROXime (ZINACEF)  IV  1.5 g Intravenous Q12H  . Chlorhexidine Gluconate Cloth  6 each Topical Daily  . coagulation factor VIIa recomb  90 mcg/kg Intravenous Once  . docusate sodium  200 mg Oral Daily  . insulin aspart  0-24 Units Subcutaneous Q4H  . mupirocin ointment  1 application Nasal BID  . [START ON 10/21/2013] pantoprazole  40 mg Oral Daily  . rifampin  600 mg Oral Once  . sodium chloride  3 mL Intravenous Q12H  . vancomycin  1,000 mg Intravenous Q12H    Infusions: . sodium chloride    . sodium chloride 20 mL/hr at 10/19/13 1700  . sodium chloride 250 mL (10/20/13 0600)  . amiodarone (NEXTERONE PREMIX) 360 mg/200 mL dextrose 30 mg/hr  (10/20/13 0200)  . dexmedetomidine Stopped (10/20/13 0930)  . dexmedetomidine    . DOPamine 5 mcg/kg/min (10/19/13 1700)  . lactated ringers 20 mL/hr at 10/19/13 1700  . milrinone Stopped (10/20/13 0915)  . nitroGLYCERIN Stopped (10/19/13 1700)  . norepinephrine (LEVOPHED) Adult infusion 9 mcg/min (10/20/13 1000)  . phenylephrine (NEO-SYNEPHRINE) Adult infusion Stopped (10/19/13 1700)    PRN Medications: albumin human, midazolam, morphine injection, ondansetron (ZOFRAN) IV, oxyCODONE, sodium chloride   Assessment:   1) Chronic systolic HF, Class IV, EF 20-25%     --s/p HM II VAD implant 12/15 2) NICM/ICM  3) Recurrent Vtach  - ablation 09/2011 and 06/2013  4) Atrial flutter  - s/p DC-CV 10/15/2013  5) CAD  6) PAD   Plan/Discussion:    Doing very well POD #1. Hopefully can extubate today.   Wean pressors and diurese slowly as tolerated.   Remains A-paced. Continue IV Amiodarone at 30 mg.   Discussed with surgical team at bedside.  VAD parameters look good.   The patient is critically ill with multiple organ systems failure and requires high complexity decision making for assessment and support, frequent evaluation and titration of therapies, application of advanced monitoring technologies and extensive interpretation of multiple databases.   Critical Care Time devoted to patient care services described in this note is 35 Minutes.   Tywanna Seifer,MD 12:56 PM  Length of Stay: 2  Advanced Heart Failure Team Pager [phone] (M-F; 7a - 4p)

## 2013-10-20 NOTE — Progress Notes (Signed)
Ignacia Palma, OTR/L [phone] 10/20/2013

## 2013-10-20 NOTE — Progress Notes (Signed)
UR Completed.  Atlee Kluth [name] [phone] 10/20/2013  

## 2013-10-20 NOTE — Anesthesia Postprocedure Evaluation (Signed)
  Anesthesia Post-op Note  Patient: [name]  Procedure(s) Performed: Procedure(s) with comments: INSERTION OF IMPLANTABLE LEFT VENTRICULAR ASSIST DEVICE (N/A) - CIRC ARREST  NITRIC OXIDE  MEDTR[employer] ICD INTRAOPERATIVE TRANSESOPHAGEAL ECHOCARDIOGRAM (N/A)  Patient Location: SICU  Anesthesia Type:General  Level of Consciousness: sedated and unresponsive  Airway and Oxygen Therapy: Patient remains intubated per anesthesia plan and Patient placed on Ventilator (see vital sign flow sheet for setting)  Post-op Pain: none  Post-op Assessment: Post-op Vital signs reviewed  Post-op Vital Signs: stable  Complications: No apparent anesthesia complications

## 2013-10-20 NOTE — Progress Notes (Signed)
LVAD interrogation reveals:  Speed:  8600 Flow:  3.7 Power:  4.2 PI: 5.6 Alarms:  none Events: few PI events Fixed speed:  8600 Low speed limit: 8000  VAD dressing dry and intact; anchor device in place. Bedside nurse ordered aquacel strips to use with daily dressing.  LVAD equipment check completed and is in good working order.  Back-up equipment present with accurate settings.  Jeanella Anton [facility] 9:26 AM

## 2013-10-21 ENCOUNTER — Inpatient Hospital Stay (HOSPITAL_COMMUNITY): Payer: Medicare Other

## 2013-10-21 DIAGNOSIS — Z95818 Presence of other cardiac implants and grafts: Secondary | ICD-10-CM

## 2013-10-21 DIAGNOSIS — I5023 Acute on chronic systolic (congestive) heart failure: Secondary | ICD-10-CM

## 2013-10-21 DIAGNOSIS — I369 Nonrheumatic tricuspid valve disorder, unspecified: Secondary | ICD-10-CM

## 2013-10-21 LAB — CBC
HCT: 24 % — ABNORMAL LOW (ref 39.0–52.0)
HCT: 24.6 % — ABNORMAL LOW (ref 39.0–52.0)
Hemoglobin: 8.2 g/dL — ABNORMAL LOW (ref 13.0–17.0)
Hemoglobin: 8.4 g/dL — ABNORMAL LOW (ref 13.0–17.0)
MCH: 30.6 pg (ref 26.0–34.0)
MCH: 30.8 pg (ref 26.0–34.0)
MCHC: 34.2 g/dL (ref 30.0–36.0)
MCHC: 34.1 g/dL (ref 30.0–36.0)
MCV: 89.6 fL (ref 78.0–100.0)
MCV: 90.1 fL (ref 78.0–100.0)
Platelets: 82 10*3/uL — ABNORMAL LOW (ref 150–400)
RBC: 2.68 MIL/uL — ABNORMAL LOW (ref 4.22–5.81)
RBC: 2.73 MIL/uL — ABNORMAL LOW (ref 4.22–5.81)
RDW: 17.6 % — ABNORMAL HIGH (ref 11.5–15.5)
WBC: 13.2 10*3/uL — ABNORMAL HIGH (ref 4.0–10.5)
WBC: 12.1 10*3/uL — ABNORMAL HIGH (ref 4.0–10.5)

## 2013-10-21 LAB — COMPREHENSIVE METABOLIC PANEL
ALT: 82 U/L — ABNORMAL HIGH (ref 0–53)
AST: 149 U/L — ABNORMAL HIGH (ref 0–37)
BUN: 23 mg/dL (ref 6–23)
CO2: 25 mEq/L (ref 19–32)
Calcium: 8.1 mg/dL — ABNORMAL LOW (ref 8.4–10.5)
Chloride: 102 mEq/L (ref 96–112)
Creatinine, Ser: 1.28 mg/dL (ref 0.50–1.35)
GFR calc Af Amer: 62 mL/min — ABNORMAL LOW (ref >90)
GFR calc non Af Amer: 53 mL/min — ABNORMAL LOW (ref >90)
Glucose, Bld: 172 mg/dL — ABNORMAL HIGH (ref 70–99)
Sodium: 136 mEq/L (ref 135–145)
Total Bilirubin: 0.6 mg/dL (ref 0.3–1.2)

## 2013-10-21 LAB — BASIC METABOLIC PANEL
CO2: 25 mEq/L (ref 19–32)
Calcium: 8.3 mg/dL — ABNORMAL LOW (ref 8.4–10.5)
GFR calc non Af Amer: 51 mL/min — ABNORMAL LOW (ref >90)
Glucose, Bld: 140 mg/dL — ABNORMAL HIGH (ref 70–99)
Potassium: 4.7 mEq/L (ref 3.5–5.1)
Sodium: 138 mEq/L (ref 135–145)

## 2013-10-21 LAB — CBC WITH DIFFERENTIAL/PLATELET
Eosinophils Absolute: 0 10*3/uL (ref 0.0–0.7)
Hemoglobin: 8.4 g/dL — ABNORMAL LOW (ref 13.0–17.0)
Lymphocytes Relative: 6 % — ABNORMAL LOW (ref 12–46)
Lymphs Abs: 0.8 10*3/uL (ref 0.7–4.0)
MCH: 31 pg (ref 26.0–34.0)
Monocytes Relative: 18 % — ABNORMAL HIGH (ref 3–12)
Neutrophils Relative %: 76 % (ref 43–77)
RBC: 2.71 MIL/uL — ABNORMAL LOW (ref 4.22–5.81)
WBC: 13.1 10*3/uL — ABNORMAL HIGH (ref 4.0–10.5)

## 2013-10-21 LAB — POCT I-STAT 3, ART BLOOD GAS (G3+)
Acid-Base Excess: 1 mmol/L (ref 0.0–2.0)
Bicarbonate: 25.7 mEq/L — ABNORMAL HIGH (ref 20.0–24.0)
Bicarbonate: 26.2 mEq/L — ABNORMAL HIGH (ref 20.0–24.0)
O2 Saturation: 91 %
O2 Saturation: 95 %
TCO2: 27 mmol/L (ref 0–100)
TCO2: 27 mmol/L (ref 0–100)
pCO2 arterial: 42.8 mmHg (ref 35.0–45.0)
pH, Arterial: 7.385 (ref 7.350–7.450)
pO2, Arterial: 63 mmHg — ABNORMAL LOW (ref 80.0–100.0)
pO2, Arterial: 72 mmHg — ABNORMAL LOW (ref 80.0–100.0)

## 2013-10-21 LAB — GLUCOSE, CAPILLARY
Glucose-Capillary: 118 mg/dL — ABNORMAL HIGH (ref 70–99)
Glucose-Capillary: 144 mg/dL — ABNORMAL HIGH (ref 70–99)
Glucose-Capillary: 148 mg/dL — ABNORMAL HIGH (ref 70–99)

## 2013-10-21 LAB — CARBOXYHEMOGLOBIN
Carboxyhemoglobin: 1.9 % — ABNORMAL HIGH (ref 0.5–1.5)
Carboxyhemoglobin: 2.1 % — ABNORMAL HIGH (ref 0.5–1.5)
Carboxyhemoglobin: 2 % — ABNORMAL HIGH (ref 0.5–1.5)
Methemoglobin: 0.9 % (ref 0.0–1.5)
Methemoglobin: 1.5 % (ref 0.0–1.5)
Methemoglobin: 1.3 % (ref 0.0–1.5)
O2 Saturation: 93.6 %
O2 Saturation: 68.8 %
Total hemoglobin: 8.3 g/dL — ABNORMAL LOW (ref 13.5–18.0)
Total hemoglobin: 8.3 g/dL — ABNORMAL LOW (ref 13.5–18.0)
Total hemoglobin: 8.1 g/dL — ABNORMAL LOW (ref 13.5–18.0)

## 2013-10-21 LAB — PROTIME-INR
INR: 1.47 (ref 0.00–1.49)
Prothrombin Time: 17.4 seconds — ABNORMAL HIGH (ref 11.6–15.2)

## 2013-10-21 LAB — PREPARE RBC (CROSSMATCH)

## 2013-10-21 LAB — LACTATE DEHYDROGENASE: LDH: 726 U/L — ABNORMAL HIGH (ref 94–250)

## 2013-10-21 LAB — MAGNESIUM
Magnesium: 2.2 mg/dL (ref 1.5–2.5)
Magnesium: 2 mg/dL (ref 1.5–2.5)

## 2013-10-21 LAB — PHOSPHORUS: Phosphorus: 4.3 mg/dL (ref 2.3–4.6)

## 2013-10-21 MED ORDER — MAGNESIUM SULFATE 40 MG/ML IJ SOLN
2.0000 g | Freq: Once | INTRAMUSCULAR | Status: AC
  Administered 2013-10-21: 2 g via INTRAVENOUS

## 2013-10-21 MED ORDER — WARFARIN SODIUM 2.5 MG PO TABS
2.5000 mg | ORAL_TABLET | Freq: Once | ORAL | Status: AC
  Administered 2013-10-21: 2.5 mg via ORAL
  Filled 2013-10-21: qty 1

## 2013-10-21 MED ORDER — TORSEMIDE 20 MG PO TABS
20.0000 mg | ORAL_TABLET | Freq: Two times a day (BID) | ORAL | Status: DC
  Filled 2013-10-21 (×2): qty 1

## 2013-10-21 MED ORDER — MIDAZOLAM HCL 2 MG/2ML IJ SOLN
INTRAMUSCULAR | Status: AC
  Filled 2013-10-21: qty 2

## 2013-10-21 MED ORDER — MIDAZOLAM HCL 2 MG/2ML IJ SOLN
INTRAMUSCULAR | Status: AC
  Administered 2013-10-21: 2 mg
  Filled 2013-10-21: qty 2

## 2013-10-21 MED ORDER — FUROSEMIDE 10 MG/ML IJ SOLN
6.0000 mg/h | INTRAVENOUS | Status: DC
  Administered 2013-10-23: 6 mg/h via INTRAVENOUS
  Filled 2013-10-21 (×3): qty 25

## 2013-10-21 MED ORDER — METOCLOPRAMIDE HCL 5 MG/ML IJ SOLN
10.0000 mg | Freq: Four times a day (QID) | INTRAMUSCULAR | Status: DC
  Administered 2013-10-21 – 2013-10-24 (×12): 10 mg via INTRAVENOUS
  Filled 2013-10-21 (×16): qty 2

## 2013-10-21 MED ORDER — AMIODARONE LOAD VIA INFUSION
150.0000 mg | Freq: Once | INTRAVENOUS | Status: AC
  Administered 2013-10-21: 150 mg via INTRAVENOUS
  Filled 2013-10-21: qty 83.34

## 2013-10-21 MED ORDER — SODIUM CHLORIDE 0.9 % IJ SOLN: 10.0000 mL | INTRAMUSCULAR | Status: DC | PRN

## 2013-10-21 MED ORDER — DEXTROSE 5 % IV SOLN
300.0000 mg | Freq: Once | INTRAVENOUS | Status: DC
  Administered 2013-10-21: 300 mg via INTRAVENOUS
  Filled 2013-10-21: qty 6

## 2013-10-21 MED ORDER — SODIUM CHLORIDE 0.9 % IJ SOLN
10.0000 mL | INTRAMUSCULAR | Status: DC | PRN
  Administered 2013-10-23: 10 mL via INTRAVENOUS

## 2013-10-21 MED ORDER — SODIUM CHLORIDE 0.9 % IJ SOLN
10.0000 mL | Freq: Two times a day (BID) | INTRAMUSCULAR | Status: DC
  Administered 2013-10-21 – 2013-10-22 (×2): 10 mL via INTRAVENOUS

## 2013-10-21 MED ORDER — FUROSEMIDE 10 MG/ML IJ SOLN: 8.0000 mg/h | INTRAVENOUS | Status: DC

## 2013-10-21 MED ORDER — ALBUMIN HUMAN 5 % IV SOLN
12.5000 g | Freq: Once | INTRAVENOUS | Status: AC
  Administered 2013-10-21: 12.5 g via INTRAVENOUS

## 2013-10-21 MED ORDER — LIDOCAINE HCL (CARDIAC) 20 MG/ML IV SOLN: 10.0000 mg | Freq: Once | INTRAVENOUS | Status: DC

## 2013-10-21 MED ORDER — SODIUM CHLORIDE 0.9 % IJ SOLN
10.0000 mL | Freq: Two times a day (BID) | INTRAMUSCULAR | Status: DC
  Administered 2013-10-21 – 2013-10-22 (×2): 10 mL
  Administered 2013-10-23: 20 mL
  Administered 2013-10-24: 10 mL

## 2013-10-21 MED ORDER — LIDOCAINE HCL (CARDIAC) 20 MG/ML IV SOLN
100.0000 mg | Freq: Once | INTRAVENOUS | Status: AC
  Administered 2013-10-21: 100 mg via INTRAVENOUS

## 2013-10-21 MED ORDER — FUROSEMIDE 10 MG/ML IJ SOLN
8.0000 mg/h | INTRAVENOUS | Status: DC
  Administered 2013-10-21: 8 mg/h via INTRAVENOUS
  Filled 2013-10-21: qty 25

## 2013-10-21 MED ORDER — ALBUMIN HUMAN 5 % IV SOLN
INTRAVENOUS | Status: AC
  Filled 2013-10-21: qty 500

## 2013-10-21 MED ORDER — FUROSEMIDE 10 MG/ML IJ SOLN
20.0000 mg | Freq: Once | INTRAMUSCULAR | Status: AC
  Administered 2013-10-21: 20 mg via INTRAVENOUS

## 2013-10-21 MED ORDER — DEXTROSE 5 % IV SOLN
300.0000 mg | Freq: Once | INTRAVENOUS | Status: AC
  Filled 2013-10-21: qty 6

## 2013-10-21 MED ORDER — AMIODARONE IV BOLUS ONLY 150 MG/100ML
150.0000 mg | Freq: Once | INTRAVENOUS | Status: AC
  Administered 2013-10-21: 150 mg via INTRAVENOUS

## 2013-10-21 MED ORDER — WARFARIN - PHYSICIAN DOSING INPATIENT
Freq: Every day | Status: DC
  Administered 2013-10-26 – 2013-10-30 (×4)

## 2013-10-21 NOTE — Progress Notes (Signed)
Patient went into [facility] rate of 157 sustained.  CVP 7 with PI 3.1 on LVAD.  MAP at this time of 62.  Dr. Teressa Lower notified as well as Dr. Laneta Simmers notified.  Tonye Becket NP to come to bedside to see patient.  Please see Amy Clegg and Dr. Lubertha Basque notes for further treatment orders.

## 2013-10-21 NOTE — Progress Notes (Signed)
T. CTS p.m. Rounds  Patient developed ventricular arrhythmias today requiring amiodarone bolus and DC cardioversion Hemodynamics remained adequate throughout periods of ventricular arrhythmias Patient currently in sinus rhythm on IV amiodarone Significant pulsatility on arterial waveform and PI 8.1-will increase LVAD speed 8600 RPM PICC line placed via right arm in good position-chest x-ray however shows significant edema. We'll start IV Lasix infusion P.m. labs reviewed and are satisfactory-mixed venous saturation 65%, hemoglobin 8.2  Chest tube output remains persistent but then serosanguineous Abdomen soft some nausea with diminished bowel sounds, continue Reglan

## 2013-10-21 NOTE — Evaluation (Signed)
Physical Therapy Evaluation Patient Details Name: [name] MRN: [id number] DOB: [birth year] Today's Date: 10/21/2013 Time: 4098-1191 PT Time Calculation (min): 34 min  PT Assessment / Plan / Recommendation History of Present Illness  Pt admit for LVAD placement.    Clinical Impression  Pt admitted with above. Pt currently with functional limitations due to the deficits listed below (see PT Problem List). Pt feeling poorly but should progress well once medically stable.  Pt will benefit from skilled PT to increase their independence and safety with mobility to allow discharge to the venue listed below.    PT Assessment  Patient needs continued PT services    Follow Up Recommendations  Home health PT;Supervision/Assistance - 24 hour                Equipment Recommendations  None recommended by PT         Frequency Min 5X/week    Precautions / Restrictions Precautions Precautions: None;Fall;Sternal (LVAD) Precaution Comments: LVAD.  mulitple lines Restrictions Weight Bearing Restrictions: No (sternal precautions )   Pertinent Vitals/Pain PI 7.8 down to 4 with sitting EOB.  10/10 sternal pain which nursing brought pain meds.      Mobility  Bed Mobility Bed Mobility: Rolling Left;Supine to Sit;Sitting - Scoot to [place] of Bed;Sit to Supine Rolling Left: 3: Mod assist Left Sidelying to Sit: 1: +2 Total assist;HOB elevated Left Sidelying to Sit: Patient Percentage: 50% Sitting - Scoot to Edge of Bed: 3: Mod assist Sit to Supine: 1: +2 Total assist Sit to Supine: Patient Percentage: 10% Details for Bed Mobility Assistance: Pt requires verbal cues for technique.  He needs assist to move LEs off bed and to lift UB up.  Pt. with increased dizziness EOB and fully assisted to supine  Transfers Transfers: Not assessed Ambulation/Gait Ambulation/Gait Assistance: Not tested (comment) Stairs: No Wheelchair Mobility Wheelchair Mobility: No         PT Diagnosis:  Generalized weakness;Acute pain  PT Problem List: Decreased activity tolerance;Decreased balance;Decreased mobility;Decreased knowledge of use of DME;Decreased safety awareness;Decreased knowledge of precautions;Pain PT Treatment Interventions: Gait training;DME instruction;Functional mobility training;Therapeutic activities;Therapeutic exercise;Balance training;Patient/family education     PT Goals(Current goals can be found in the care plan section) Acute Rehab PT Goals Patient Stated Goal: to get better PT Goal Formulation: With patient Time For Goal Achievement: 11/04/13 Potential to Achieve Goals: Good  Visit Information  Last PT Received On: 10/21/13 Assistance Needed: +2 PT/OT/SLP Co-Evaluation/Treatment: Yes Reason for Co-Treatment: Complexity of the patient's impairments (multi-system involvement) PT goals addressed during session: Mobility/safety with mobility OT goals addressed during session: Other (comment) (functional transfers) History of Present Illness: Pt admit for LVAD placement.         Prior Functioning  Home Living Family/patient expects to be discharged to:: Private residence Living Arrangements: Spouse/significant other Available Help at Discharge: Family;Friend(s);Available 24 hours/day Type of Home: House Home Access: Level entry Home Layout: Two level Alternate Level Stairs-Number of Steps: doesnt use second level Home Equipment: Walker - 2 wheels;Cane - quad;Shower seat Prior Function Level of Independence: Independent Comments: Pt reports he was independent with all BADLs and IADLs Communication Communication: No difficulties Dominant Hand: Right    Cognition  Cognition Arousal/Alertness: Awake/alert Behavior During Therapy: WFL for tasks assessed/performed Overall Cognitive Status: Within Functional Limits for tasks assessed    Extremity/Trunk Assessment Upper Extremity Assessment Upper Extremity Assessment: Defer to OT evaluation Lower  Extremity Assessment Lower Extremity Assessment: Generalized weakness Cervical / Trunk Assessment Cervical / Trunk Assessment:  Normal   Balance Balance Balance Assessed: Yes Static Sitting Balance Static Sitting - Balance Support: Feet supported;No upper extremity supported Static Sitting - Level of Assistance: 4: Min assist;3: Mod assist Static Sitting - Comment/# of Minutes: Pt required min assist initially progressing to mod assist and leaning left.  Pt c/o dizziness while sitting EOB for 8 min with dizziness worsening.  Initiial PI on arrival 7.8.  PI EOB 6.1-6.4.  Dropped to 4.0 as pt moving back to supine.  RN notified and MD came in at end of session.   Dynamic Standing Balance Dynamic Standing - Level of Assistance: Not tested (comment)  End of Session PT - End of Session Equipment Utilized During Treatment: Gait belt;Oxygen Activity Tolerance: Patient limited by fatigue;Treatment limited secondary to medical complications (Comment) (PI dropped) Patient left: in bed;with call bell/phone within reach;with nursing/sitter in room (with MD in room) Nurse Communication: Mobility status       [name],Ilaisaane Marts 10/21/2013, 1:02 PM [facility] Acute Rehabilitation [phone] (pager)

## 2013-10-21 NOTE — Progress Notes (Signed)
Patient ID: [name], male   DOB: [birth year], 72 y.o.   MRN: [id number] HeartMate 2 Rounding Note  Subjective:    [name] is a 72 yo man with an ischemic CM s/p ICD implant, chronic systolic HF (EF 20-25%), paroxysmal VT s/p ablation (09/2011) and repeat (06/2013), CAD, PVD (s/p R fem pop BPG 01/2010; s/p L CFA BPG 05/2002), AAA (s/p repair 04/2002), CAS (s/p L CEA 2008), HTN, arthritis, and TIA. Recently admitted 12/8-12/11/14 for syncopal episode from inappropriate ATP which accelerated SVT>>VT with failed ATP and then spontaneous termination of VT. During hospital stay had repeat RHC, hemodynamics were stable and had TEE-DC-CV of AFL 10/15/13 which was successful. He was presented to our [facility] team and felt to be good LVAD candidate and then presented at [facility] transplant listing conference and they felt he was not a candidate for transplant, but good candidate for LVAD under DT criteria.   POD 2  HeartMate II LVAD.  No problems overnight. Remained on NO 0.5ppm and dop 5 mcg and low dose Levophed. The Levophed was weaned off this am since his MAP was 85-90. Lasix drip started for total body volume excess with wt 16 lbs over preop and 3 lbs over yesterday. While sitting up on side of bed he became presyncopal and had several PI events. He was put back in bed and developed sustained VT terminated with ATP. Went back into A-flutter. Lasix stopped.    LVAD INTERROGATION:  HeartMate II LVAD:  Flow 4.1 liters/min, speed 8600, power 4.4, PI 7.6.  Multiple PI events this morning.  Objective:    Vital Signs:   Temp:  [98 F (36.7 C)-99.1 F (37.3 C)] 98 F (36.7 C) (12/17 1145) Pulse Rate:  [65-100] 65 (12/17 1100) Resp:  [0-23] 20 (12/17 1100) SpO2:  [92 %-99 %] 92 % (12/17 1100) Arterial Line BP: (58-107)/(51-85) 81/70 mmHg (12/17 1100) Weight:  [206 lb 12.7 oz (93.8 kg)] 206 lb 12.7 oz (93.8 kg) (12/17 0500) Last BM Date: 10/18/13 Mean arterial Pressure  80-90  Intake/Output:   Intake/Output Summary (Last 24 hours) at 10/21/13 1303 Last data filed at 10/21/13 1200  Gross per 24 hour  Intake 2622.45 ml  Output   2315 ml  Net 307.45 ml     Physical Exam: General: Awake and alert HEENT: normal Neck: supple. JVP . Carotids 2+ bilat; no bruits. No lymphadenopathy or thryomegaly appreciated. Cor: No native valve sounds,  LVAD hum present. Lungs: clear Abdomen: soft, nontender, nondistended. No hepatosplenomegaly. No bruits or masses. Hypoactive sounds. Driveline: C/D/I; securement device intact and driveline incorporated Extremities: moderate anasarca Neuro: alert & orientedx3, cranial nerves grossly intact. moves all 4 extremities w/o difficulty. Affect pleasant  Telemetry: A-paced 70-80 this am before VT. Now A-flutter 110-120  Labs: Basic Metabolic Panel:  Recent Labs Lab 10/19/13 0450  10/19/13 1710 10/19/13 2240 10/19/13 2242 10/20/13 0400 10/20/13 1623 10/20/13 1643 10/21/13 0405 10/21/13 1145  NA 138  < > 144  --  140 139 136  --  138 136  K 4.2  < > 3.5  --  4.5 5.2* 4.6  --  4.7 4.5  CL 104  < > 109  --  105 106 108  --  105 102  CO2 26  --  28  --   --  26  --   --  25 25  GLUCOSE 99  < > 39*  --  152* 129* 156*  --  140* 172*  BUN 24*  < >  18  --  18 19 18   --  22 23  CREATININE 1.57*  < > 1.24 1.31 1.40* 1.39* 1.50* 1.43* 1.33 1.28  CALCIUM 8.8  --  8.5  --   --  8.2*  --   --  8.3* 8.1*  MG  --   --  2.0 2.8*  --  2.4  --  2.2 2.2  --   PHOS  --   --   --   --   --  3.1  --   --  4.3  --   < > = values in this interval not displayed.  Liver Function Tests:  Recent Labs Lab 10/20/13 0400 10/21/13 1145  AST 233* 149*  ALT 49 82*  ALKPHOS  --  79  BILITOT 2.5* 0.6  PROT  --  5.1*  ALBUMIN  --  2.7*   No results found for this basename: LIPASE, AMYLASE,  in the last 168 hours No results found for this basename: AMMONIA,  in the last 168 hours  CBC:  Recent Labs Lab 10/20/13 0400 10/20/13 1150  10/20/13 1502 10/20/13 1623 10/20/13 1643 10/21/13 0405 10/21/13 1130  WBC 12.1* 10.1  --   --  11.3* 13.1* 13.2*  NEUTROABS 8.8*  --   --   --   --  9.9*  --   HGB 8.0* 8.3*  --  8.5* 8.6* 8.4* 8.2*  HCT 23.3* 23.3*  --  25.0* 25.1* 24.1* 24.0*  MCV 90.3 87.6  --   --  89.3 88.9 89.6  PLT 126* 108* 97*  --  93* 109* 89*    INR:  Recent Labs Lab 10/19/13 1440 10/19/13 1700 10/20/13 0400 10/20/13 1502 10/21/13 0405  INR 1.74* 1.15 1.49 1.83* 1.47   LDH 659 < 726  Other results:  EKG:   Imaging: Dg Chest Port 1 View  10/21/2013   CLINICAL DATA:  Status post insertion of left ventricular assist device.  EXAM: PORTABLE CHEST - 1 VIEW  COMPARISON:  10/20/2013  FINDINGS: Bilateral chest tubes present without evidence of pneumothorax or significant pleural fluid. There is improved aeration of the left lower lobe was some residual left basilar atelectasis remaining. Cardiac and mediastinal contours are stable. Swan-Ganz catheter tip is in the proximal right pulmonary artery. Visualized portion of the LVAD at the left ventricular apex shows stable positioning.  IMPRESSION: No pneumothorax.  Improved aeration of the left lower lobe.   Electronically Signed   By: Irish Lack M.D.   On: 10/21/2013 07:59   Dg Chest Port 1 View  10/20/2013   CLINICAL DATA:  LVAD bleeding  EXAM: PORTABLE CHEST - 1 VIEW  COMPARISON:  10/20/2013  FINDINGS: A defibrillator is again noted. A right-sided central venous line and right-sided Swan-Ganz catheter are again noted and stable. The tip of the Swan-Ganz catheter is noted in the pulmonary outflow tract directed towards the right pulmonary artery mediastinal drain and bilateral thoracostomy catheters are again seen. No pneumothorax is noted. Persistent left retro cardiac density is seen. Left ventricular assist device is again identified and unchanged. Endotracheal tube has been removed in the interval as has a nasogastric catheter.  IMPRESSION: Persistent  changes in the left retrocardiac region with slight increase in the degree of pleural fluid.  Support apparatus as described stable in appearance.   Electronically Signed   By: Alcide Clever M.D.   On: 10/20/2013 16:36   Dg Chest Port 1 View  10/20/2013   CLINICAL DATA:  Patient is status post LVAD  EXAM: PORTABLE CHEST - 1 VIEW  COMPARISON:  10/22/2013 at  FINDINGS: The cardiac silhouette is enlarged. Chest tubes are again appreciated within the right lung apices unchanged. There is no evidence of appreciable pneumothorax. A Swan-Ganz catheter is appreciated with tip projecting region the main pulmonary outflow tract. A right internal jugular catheter is identified with tip projected regions superior vena cava. A left-sided AICD unit is appreciated with lead tip projecting region right atrium the right ventricle. A left ventricular assist device is once again appreciated position unchanged. Mediastinal drain is again of appreciated unchanged in position. An NG tube is seen within the lung view study. There is mild prominence of the interstitial markings unchanged compared to previous study. No focal regions consolidation identified. The osseous structures unremarkable. Stable chest is appreciated within the left lung base. The patient is status post median sternotomy.  IMPRESSION: Support lines and tubes unchanged which appear to be adequately positioned. Stable interstitial findings no evidence of acute abnormalities.   Electronically Signed   By: Salome Holmes M.D.   On: 10/20/2013 08:22   Dg Chest Port 1 View  10/19/2013   CLINICAL DATA:  Postoperative chest. Left ventricular assist device placement.  EXAM: PORTABLE CHEST - 1 VIEW  COMPARISON:  10/18/2013.  FINDINGS: Endotracheal tube tip 8 cm above the carina. NG tube below left hemidiaphragm. Swan-Ganz catheter tip projected over the main pulmonary outflow tract. Right IJ catheter projected over upper superior vena cava. Mediastinal catheter noted in  place. Left chest tube noted in good anatomic position. Right chest tube noted in good anatomic position. No pneumothorax. Cardiac pacer with lead tips in right atrium and right ventricle. Left ventricular assist device noted.  Cardiomegaly. No pulmonary venous congestion or pleural effusion. No pneumothorax. Mild interstitial prominence again noted. A mild component of interstitial edema cannot be excluded.  IMPRESSION: 1. Left ventricular assist device placement. Good anatomic positioning of support lines and tubes. 2. Mild bilateral pulmonary interstitial prominence.   Electronically Signed   By: Maisie Fus  Register   On: 10/19/2013 18:07      Medications:     Scheduled Medications: . acetaminophen  1,000 mg Oral Q6H   Or  . acetaminophen (TYLENOL) oral liquid 160 mg/5 mL  1,000 mg Per Tube Q6H  . aspirin EC  325 mg Oral Daily   Or  . aspirin  324 mg Per Tube Daily   Or  . aspirin  300 mg Rectal Daily  . bisacodyl  10 mg Oral Daily   Or  . bisacodyl  10 mg Rectal Daily  . Chlorhexidine Gluconate Cloth  6 each Topical Daily  . docusate sodium  200 mg Oral Daily  . insulin aspart  0-24 Units Subcutaneous Q4H  . metoCLOPramide (REGLAN) injection  10 mg Intravenous Q6H  . mupirocin ointment  1 application Nasal BID  . pantoprazole  40 mg Oral Daily  . rifampin  600 mg Oral Once  . sodium chloride  10 mL Intravenous Q12H  . sodium chloride  3 mL Intravenous Q12H     Infusions: . sodium chloride 20 mL/hr (10/21/13 0800)  . sodium chloride 20 mL/hr at 10/19/13 1700  . sodium chloride 250 mL (10/20/13 0600)  . amiodarone (NEXTERONE PREMIX) 360 mg/200 mL dextrose 30 mg/hr (10/21/13 1136)  . DOPamine 5 mcg/kg/min (10/21/13 0800)  . lactated ringers 20 mL/hr (10/21/13 0800)  . nitroGLYCERIN Stopped (10/19/13 1700)  . norepinephrine (LEVOPHED) Adult infusion 3 mcg/min (10/21/13  1200)  . phenylephrine (NEO-SYNEPHRINE) Adult infusion Stopped (10/19/13 1700)     PRN  Medications:  morphine injection, ondansetron (ZOFRAN) IV, oxyCODONE, sodium chloride, sodium chloride   Assessment:  1) Chronic systolic HF, Class IV, EF 20-25%  --s/p HM II VAD implant 12/15  2) NICM/ICM  3) Recurrent Vtach  - ablation 09/2011 and 06/2013  4) Atrial flutter  - s/p DC-CV 10/15/2013  5) CAD with recanalized occluded RCA with left to right collat.  6) PAD: s/p aorto-bifem, bilateral renal artery bypass, bilateral fem-pop, and left CEA.    Plan/Discussion:    Agree with Dr. Gala Romney that VT and PI events likely due to suction event. He had total body volume overload but low PAD and CVP. Co-ox this am was 54 but repeat is 69% with CVP 9. Will hold off on diuresis for now until he starts mobilizing this excess fluid. Speed decreased to 8400 by DB. Continue amiodarone IV. If rapid A-flutter persists he will need DCCV.  Chest tube output yesterday was significant but it is thin serosanguinous. Will keep chest tubes in for now. I think we can start coumadin slowly.  Elevated LDH likely due to banked blood.    I reviewed the LVAD parameters from today, and compared the results to the patient's prior recorded data.  No programming changes were made.  The LVAD is functioning within specified parameters.  The patient performs LVAD self-test daily.  LVAD interrogation was negative for any significant power changes, alarms or PI events/speed drops.  LVAD equipment check completed and is in good working order.  Back-up equipment present.   LVAD education done on emergency procedures and precautions and reviewed exit site care.  Length of Stay: 3  [name] 10/21/2013, 1:03 PM  VAD Team Pager [phone] (7am - 7am)

## 2013-10-21 NOTE — Progress Notes (Signed)
CSW met with patient at bedside. Patient was awake and recognized CSW. He stated that he was feeling weak and was tired. RN hopeful to get patient sitting up more today. CSW offered support and encouragement and will continue to follow through recovery.  Lasandra Beech, LCSW [phone]

## 2013-10-21 NOTE — Progress Notes (Addendum)
Advanced Heart Failure Rounding Note  Primary EP: Dr. Graciela Husbands  Primary Physician: Dr. [name] ([place], [place])  Reason for Consultation: Heart failure with recurrent VT (syncope)   Subjective:    POD # 2 HM II VAD placement.   Extubated yesterday. Did well overnight. Chest tube output 2010 cc last 24 hours. Nitric weaned down to 0.5 ppm., Dopamine 5 mcg. Levophed weaned off this am    MAPs in 80s - 90s.  Swan #s this am prior to d/c swan CVP 10 PA 38/20 CI 2.2 Co-ox 54%  Hgb 8.1  This am was sitting up on side of bed and became very presyncopal with several PI events. Put back in bed. Then developed sustained VT and received ATP which terminated VT but now back in AFL.    VAD interrogated personally Speed 8600 Flow 4.1 Power 4.4 PI 7.6 Alarms: none Events:  multiple  PI events this am   Objective:     Vital Signs:   Temp:  [98.2 F (36.8 C)-99.1 F (37.3 C)] 98.2 F (36.8 C) (12/17 1000) Pulse Rate:  [65-100] 65 (12/17 1100) Resp:  [0-23] 20 (12/17 1100) BP: (85)/(71) 85/71 mmHg (12/16 1140) SpO2:  [92 %-99 %] 92 % (12/17 1100) Arterial Line BP: (58-107)/(51-85) 81/70 mmHg (12/17 1100) Weight:  [93.8 kg (206 lb 12.7 oz)] 93.8 kg (206 lb 12.7 oz) (12/17 0500) Last BM Date: 10/18/13  Weight change: Filed Weights   10/19/13 0300 10/20/13 0500 10/21/13 0500  Weight: 85.3 kg (188 lb 0.8 oz) 92.2 kg (203 lb 4.2 oz) 93.8 kg (206 lb 12.7 oz)    Intake/Output:   Intake/Output Summary (Last 24 hours) at 10/21/13 1132 Last data filed at 10/21/13 1000  Gross per 24 hour  Intake 2810.55 ml  Output   2645 ml  Net 165.55 ml     Physical Exam: General:  Awake,Pale and weak HEENT: normal Neck:  No lymphadenopathy or thryomegaly appreciated. +swan Cor:  Regular rate & regular rhythm. LVAD hum present.  Lungs: clear, breath sounds diminished LLL Abdomen: soft, nontender, distended. Right lower abdomen VAD drive line dressing dry/intact with attachment device  present. No bowel sounds noted Extremities: no cyanosis, clubbing, rash. Hands edematous; extremities cool. `-2+ edema Neuro: awake, alert.   Telemetry: Atrial paced 70 - 80s -> sustained VT -> ATP -> AFL 110-120   Labs: Basic Metabolic Panel:  Recent Labs Lab 10/18/13 1255 10/19/13 0450  10/19/13 1710 10/19/13 2240 10/19/13 2242 10/20/13 0400 10/20/13 1623 10/20/13 1643 10/21/13 0405  NA 139 138  < > 144  --  140 139 136  --  138  K 5.1 4.2  < > 3.5  --  4.5 5.2* 4.6  --  4.7  CL 103 104  < > 109  --  105 106 108  --  105  CO2 25 26  --  28  --   --  26  --   --  25  GLUCOSE 91 99  < > 39*  --  152* 129* 156*  --  140*  BUN 27* 24*  < > 18  --  18 19 18   --  22  CREATININE 1.56* 1.57*  < > 1.24 1.31 1.40* 1.39* 1.50* 1.43* 1.33  CALCIUM 9.0 8.8  --  8.5  --   --  8.2*  --   --  8.3*  MG  --   --   --  2.0 2.8*  --  2.4  --  2.2  2.2  PHOS  --   --   --   --   --   --  3.1  --   --  4.3  < > = values in this interval not displayed.  Liver Function Tests:  Recent Labs Lab 10/20/13 0400  AST 233*  ALT 49  BILITOT 2.5*   No results found for this basename: LIPASE, AMYLASE,  in the last 168 hours No results found for this basename: AMMONIA,  in the last 168 hours  CBC:  Recent Labs Lab 10/19/13 2240  10/20/13 0400 10/20/13 1150 10/20/13 1502 10/20/13 1623 10/20/13 1643 10/21/13 0405  WBC 12.5*  --  12.1* 10.1  --   --  11.3* 13.1*  NEUTROABS  --   --  8.8*  --   --   --   --  9.9*  HGB 8.2*  < > 8.0* 8.3*  --  8.5* 8.6* 8.4*  HCT 23.7*  < > 23.3* 23.3*  --  25.0* 25.1* 24.1*  MCV 90.8  --  90.3 87.6  --   --  89.3 88.9  PLT 120*  --  126* 108* 97*  --  93* 109*  < > = values in this interval not displayed.  Cardiac Enzymes: No results found for this basename: CKTOTAL, CKMB, CKMBINDEX, TROPONINI,  in the last 168 hours  BNP: BNP (last 3 results)  Recent Labs  09/15/13 0750 10/12/13 0951 10/20/13 0400  PROBNP 3038.0* 3404.0* 3914.0*     Imaging: Dg Chest Port 1 View  10/21/2013   CLINICAL DATA:  Status post insertion of left ventricular assist device.  EXAM: PORTABLE CHEST - 1 VIEW  COMPARISON:  10/20/2013  FINDINGS: Bilateral chest tubes present without evidence of pneumothorax or significant pleural fluid. There is improved aeration of the left lower lobe was some residual left basilar atelectasis remaining. Cardiac and mediastinal contours are stable. Swan-Ganz catheter tip is in the proximal right pulmonary artery. Visualized portion of the LVAD at the left ventricular apex shows stable positioning.  IMPRESSION: No pneumothorax.  Improved aeration of the left lower lobe.   Electronically Signed   By: Irish Lack M.D.   On: 10/21/2013 07:59   Dg Chest Port 1 View  10/20/2013   CLINICAL DATA:  LVAD bleeding  EXAM: PORTABLE CHEST - 1 VIEW  COMPARISON:  10/20/2013  FINDINGS: A defibrillator is again noted. A right-sided central venous line and right-sided Swan-Ganz catheter are again noted and stable. The tip of the Swan-Ganz catheter is noted in the pulmonary outflow tract directed towards the right pulmonary artery mediastinal drain and bilateral thoracostomy catheters are again seen. No pneumothorax is noted. Persistent left retro cardiac density is seen. Left ventricular assist device is again identified and unchanged. Endotracheal tube has been removed in the interval as has a nasogastric catheter.  IMPRESSION: Persistent changes in the left retrocardiac region with slight increase in the degree of pleural fluid.  Support apparatus as described stable in appearance.   Electronically Signed   By: Alcide Clever M.D.   On: 10/20/2013 16:36   Dg Chest Port 1 View  10/20/2013   CLINICAL DATA:  Patient is status post LVAD  EXAM: PORTABLE CHEST - 1 VIEW  COMPARISON:  10/22/2013 at  FINDINGS: The cardiac silhouette is enlarged. Chest tubes are again appreciated within the right lung apices unchanged. There is no evidence of  appreciable pneumothorax. A Swan-Ganz catheter is appreciated with tip projecting region the main pulmonary outflow tract. A  right internal jugular catheter is identified with tip projected regions superior vena cava. A left-sided AICD unit is appreciated with lead tip projecting region right atrium the right ventricle. A left ventricular assist device is once again appreciated position unchanged. Mediastinal drain is again of appreciated unchanged in position. An NG tube is seen within the lung view study. There is mild prominence of the interstitial markings unchanged compared to previous study. No focal regions consolidation identified. The osseous structures unremarkable. Stable chest is appreciated within the left lung base. The patient is status post median sternotomy.  IMPRESSION: Support lines and tubes unchanged which appear to be adequately positioned. Stable interstitial findings no evidence of acute abnormalities.   Electronically Signed   By: Salome Holmes M.D.   On: 10/20/2013 08:22   Dg Chest Port 1 View  10/19/2013   CLINICAL DATA:  Postoperative chest. Left ventricular assist device placement.  EXAM: PORTABLE CHEST - 1 VIEW  COMPARISON:  10/18/2013.  FINDINGS: Endotracheal tube tip 8 cm above the carina. NG tube below left hemidiaphragm. Swan-Ganz catheter tip projected over the main pulmonary outflow tract. Right IJ catheter projected over upper superior vena cava. Mediastinal catheter noted in place. Left chest tube noted in good anatomic position. Right chest tube noted in good anatomic position. No pneumothorax. Cardiac pacer with lead tips in right atrium and right ventricle. Left ventricular assist device noted.  Cardiomegaly. No pulmonary venous congestion or pleural effusion. No pneumothorax. Mild interstitial prominence again noted. A mild component of interstitial edema cannot be excluded.  IMPRESSION: 1. Left ventricular assist device placement. Good anatomic positioning of support  lines and tubes. 2. Mild bilateral pulmonary interstitial prominence.   Electronically Signed   By: Maisie Fus  Register   On: 10/19/2013 18:07     Medications:     Scheduled Medications: . acetaminophen  1,000 mg Oral Q6H   Or  . acetaminophen (TYLENOL) oral liquid 160 mg/5 mL  1,000 mg Per Tube Q6H  . aspirin EC  325 mg Oral Daily   Or  . aspirin  324 mg Per Tube Daily   Or  . aspirin  300 mg Rectal Daily  . bisacodyl  10 mg Oral Daily   Or  . bisacodyl  10 mg Rectal Daily  . Chlorhexidine Gluconate Cloth  6 each Topical Daily  . docusate sodium  200 mg Oral Daily  . insulin aspart  0-24 Units Subcutaneous Q4H  . metoCLOPramide (REGLAN) injection  10 mg Intravenous Q6H  . mupirocin ointment  1 application Nasal BID  . pantoprazole  40 mg Oral Daily  . rifampin  600 mg Oral Once  . sodium chloride  3 mL Intravenous Q12H  . torsemide  20 mg Oral BID    Infusions: . sodium chloride 20 mL/hr (10/21/13 0800)  . sodium chloride 20 mL/hr at 10/19/13 1700  . sodium chloride 250 mL (10/20/13 0600)  . amiodarone (NEXTERONE PREMIX) 360 mg/200 mL dextrose 30 mg/hr (10/21/13 0800)  . DOPamine 5 mcg/kg/min (10/21/13 0800)  . lactated ringers 20 mL/hr (10/21/13 0800)  . nitroGLYCERIN Stopped (10/19/13 1700)  . norepinephrine (LEVOPHED) Adult infusion 1 mcg/min (10/21/13 0800)  . 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   Extubated. Remains hemodynamically tenuous. Suspect VT due to suction event. I turned VAD down to 8400.  We repeated co-ox and CVP at bedside. CVP now 9. Co-ox 69%. Will keep levophed off for  now but can have low threshold to reinstitute pressor support.  Will hold lasix (although he is 3rd spacing). Give amio for VT. Follow CBC and electrolytes. May need another unit of blood.   Agree with Reglan for ileus.   If AFL persists may need repeat DC-CV.   D/W Dr. Laneta Simmers.   The patient is critically ill with multiple organ systems failure and requires high complexity decision making for assessment and support, frequent evaluation and titration of therapies, application of advanced monitoring technologies and extensive interpretation of multiple databases.   Critical Care Time devoted to patient care services described in this note is 35 Minutes.   Arvilla Meres, MD 11:32 AM  Length of Stay: 3  Advanced Heart Failure Team Pager [phone] (M-F; 7a - 4p)

## 2013-10-21 NOTE — Progress Notes (Signed)
Patient ID: [name], male   DOB: 04/10/1941, 72 y.o.   MRN: [id number] Called urgently to see Mr. [name] who has end stage CHF, and is s/p insertion of a LVAD. He was in sustained VT. At the bedside he was given 2 mg of IV versed and underwent multiple rounds of ATP, which would accelerated the tachycardia but then it would return back to VT at 110-130/min. His underlying rhythm was atrial fib/flutter. After our anesthesia service was summoned to manage his airway, he was cardioverted back to NSR with atrial pacing at 70/min. The ICD was interogated under my direction. He was improved. His amiodarone will be bolused.  35 min. At the bedside. Leonia Reeves.D.

## 2013-10-21 NOTE — Evaluation (Signed)
Occupational Therapy Evaluation Patient Details Name: [name] MRN: [id number] DOB: 24-Sep-1941 Today's Date: 10/21/2013 Time: 2536-6440 OT Time Calculation (min): 28 min  OT Assessment / Plan / Recommendation History of present illness Pt admit for heart failure.  Plan is to have work up for LVAD.     Clinical Impression   Pt admitted with above.  He presents to OT with the below listed deficits and will     OT Assessment  Patient needs continued OT Services    Follow Up Recommendations  Home health OT;Supervision/Assistance - 24 hour    Barriers to Discharge      Equipment Recommendations  3 in 1 bedside comode    Recommendations for Other Services    Frequency  Min 3X/week    Precautions / Restrictions Precautions Precautions: None;Fall;Sternal (LVAD) Precaution Comments: LVAD.  mulitple lines Restrictions Weight Bearing Restrictions: No (sternal precautions )   Pertinent Vitals/Pain     ADL  Eating/Feeding: NPO Grooming: Wash/dry hands;Wash/dry face;Set up Where Assessed - Grooming: Supine, head of bed up Upper Body Bathing: Moderate assistance Where Assessed - Upper Body Bathing: Supine, head of bed up Lower Body Bathing: +1 Total assistance Where Assessed - Lower Body Bathing: Supine, head of bed up;Rolling right and/or left Upper Body Dressing: +1 Total assistance Where Assessed - Upper Body Dressing: Supine, head of bed up Lower Body Dressing: +1 Total assistance Where Assessed - Lower Body Dressing: Supine, head of bed up;Rolling right and/or left Toilet Transfer: +1 Total assistance (unable to attempt today ) Toileting - Clothing Manipulation and Hygiene: +1 Total assistance Where Assessed - Toileting Clothing Manipulation and Hygiene: Supine, head of bed flat;Rolling right and/or left ADL Comments: Pt moved to EOB with total A +2.  Pt sat EOB with min A with complaint of dizzines that did not improve.  As pt was returned to supine PI 6.2-6.4.  As  pt moved to supine PI dropped 4.0  RN notified     OT Diagnosis: Generalized weakness;Acute pain  OT Problem List: Decreased strength;Decreased activity tolerance;Impaired balance (sitting and/or standing);Decreased knowledge of use of DME or AE;Decreased knowledge of precautions;Cardiopulmonary status limiting activity;Pain OT Treatment Interventions: Self-care/ADL training;Energy conservation;DME and/or AE instruction;Therapeutic activities;Patient/family education;Balance training   OT Goals(Current goals can be found in the care plan section) Acute Rehab OT Goals Patient Stated Goal: to get better OT Goal Formulation: With patient Time For Goal Achievement: 11/04/13 Potential to Achieve Goals: Good ADL Goals Pt Will Perform Grooming: with supervision;standing Pt Will Perform Upper Body Bathing: with set-up;sitting Pt Will Perform Lower Body Bathing: with supervision;sit to/from stand Pt Will Perform Upper Body Dressing: with set-up;sitting Pt Will Perform Lower Body Dressing: with supervision;sit to/from stand Pt Will Transfer to Toilet: with supervision;ambulating;bedside commode;regular height toilet Pt Will Perform Toileting - Clothing Manipulation and hygiene: with supervision;sit to/from stand Additional ADL Goal #1: Pt will be independent with managing LVAD equipment during all BADLs  Additional ADL Goal #2: Pt will be independent with sternal precautions during all BADLs   Visit Information  Last OT Received On: 10/21/13 Assistance Needed: +1 PT/OT/SLP Co-Evaluation/Treatment: Yes Reason for Co-Treatment: Complexity of the patient's impairments (multi-system involvement);For patient/therapist safety OT goals addressed during session: Other (comment) (functional transfers) History of Present Illness: Pt admit for heart failure.  Plan is to have work up for LVAD.         Prior Functioning     Home Living Family/patient expects to be discharged to:: Private  residence Living Arrangements: Spouse/significant  other Available Help at Discharge: Family;Friend(s);Available 24 hours/day Type of Home: House Home Access: Level entry Home Layout: Two level Alternate Level Stairs-Number of Steps: doesnt use second level Home Equipment: Walker - 2 wheels;Cane - quad;Shower seat Prior Function Level of Independence: Independent Comments: Pt reports he was independent with all BADLs and IADLs Communication Communication: No difficulties Dominant Hand: Right         Vision/Perception     Cognition  Cognition Arousal/Alertness: Awake/alert Behavior During Therapy: WFL for tasks assessed/performed Overall Cognitive Status: Within Functional Limits for tasks assessed    Extremity/Trunk Assessment Upper Extremity Assessment Upper Extremity Assessment: Overall WFL for tasks assessed (within confines of sternal precautions ) Lower Extremity Assessment Lower Extremity Assessment: Defer to PT evaluation Cervical / Trunk Assessment Cervical / Trunk Assessment: Normal     Mobility Bed Mobility Bed Mobility: Rolling Left;Supine to Sit;Sitting - Scoot to [place] of Bed;Sit to Supine Rolling Left: 3: Mod assist Left Sidelying to Sit: 1: +2 Total assist;HOB elevated Left Sidelying to Sit: Patient Percentage: 50% Sitting - Scoot to Edge of Bed: 3: Mod assist Sit to Supine: 1: +2 Total assist Sit to Supine: Patient Percentage: 10% Details for Bed Mobility Assistance: Pt requires verbal cues for technique.  He needs assist to move LEs off bed and to lift UB up.  Pt. with increased dizziness EOB and fully assisted to supine  Transfers Transfers: Not assessed     Exercise     Balance Balance Balance Assessed: Yes Static Sitting Balance Static Sitting - Balance Support: Feet supported;No upper extremity supported Static Sitting - Level of Assistance: 4: Min assist;3: Mod assist Static Sitting - Comment/# of Minutes: Pt required min A initially  progressing to mod A - leaned to Lt.  Pt. with complaint of dizziness  while sitting EOB x ~ 8 mins with dizziness worsening.  Starting PI 6.2-6.4 (started at 7.8), but dropped to 4.0 as pt was moving back to supine.  RN notified and MD in at end of session    End of Session OT - End of Session Activity Tolerance: Treatment limited secondary to medical complications (Comment) Patient left: in bed;with call bell/phone within reach;with nursing/sitter in room Nurse Communication: Other (comment);Mobility status (status of eval )  GO     Infiniti Hoefling M 10/21/2013, 12:11 PM

## 2013-10-21 NOTE — Anesthesia Postprocedure Evaluation (Signed)
  Anesthesia Post-op Note  Patient: [name]  Procedure(s) Performed: Procedure(s) with comments: INSERTION OF IMPLANTABLE LEFT VENTRICULAR ASSIST DEVICE (N/A) - CIRC ARREST  NITRIC OXIDE  MEDTRONIC ICD INTRAOPERATIVE TRANSESOPHAGEAL ECHOCARDIOGRAM (N/A)  Patient Location: SICU  Anesthesia Type:General  Level of Consciousness: awake, alert , oriented and patient cooperative  Airway and Oxygen Therapy: Patient Spontanous Breathing and Patient connected to nasal cannula oxygen  Post-op Pain: mild  Post-op Assessment: Post-op Vital signs reviewed, Patient's Cardiovascular Status Stable, Respiratory Function Stable, Patent Airway and No signs of Nausea or vomiting  Post-op Vital Signs: Reviewed and stable  Complications: No apparent anesthesia complications

## 2013-10-21 NOTE — Progress Notes (Signed)
1351 Called to Mr McBrides room for sustained VT.   Asymptomatic. Alert and oriented. Discussed with Dr Gala Romney the below plan/orders.  Sustained VT in the 150s. Given 150 mg bolus of amiodarone followed by 300 mg bolus but he remained in sustained VT. Map 63 and CVP 7. VAD pulse index was 2.9. Given 100 mg lidocaine, 2 albumins, and 2 grams of magnesium. He remained in persistent VT.   EP contacted. Dr Ladona Ridgel and Magdalene Molly at bedside with [employer]. He was given 2 mg versed and successfully cardioverted  at bedside. Additional 150 mg amiodarone drip given. VAD pulse index now 6.1 .   Hemoglobin 8.2 --->given 1UPRBC Repeat ECHO today  CLEGG,AMY NP-C 2:46 PM

## 2013-10-21 NOTE — Progress Notes (Signed)
  Echocardiogram 2D Echocardiogram (limited) has been performed.  [name] 10/21/2013, 3:38 PM

## 2013-10-22 ENCOUNTER — Inpatient Hospital Stay (HOSPITAL_COMMUNITY): Payer: Medicare Other

## 2013-10-22 ENCOUNTER — Encounter: Payer: Self-pay | Admitting: Cardiology

## 2013-10-22 ENCOUNTER — Encounter: Payer: Medicare Other | Admitting: Internal Medicine

## 2013-10-22 ENCOUNTER — Ambulatory Visit: Payer: Medicare Other | Admitting: Internal Medicine

## 2013-10-22 DIAGNOSIS — Z95818 Presence of other cardiac implants and grafts: Secondary | ICD-10-CM

## 2013-10-22 DIAGNOSIS — I4892 Unspecified atrial flutter: Secondary | ICD-10-CM

## 2013-10-22 DIAGNOSIS — I5023 Acute on chronic systolic (congestive) heart failure: Secondary | ICD-10-CM

## 2013-10-22 DIAGNOSIS — I472 Ventricular tachycardia: Secondary | ICD-10-CM

## 2013-10-22 LAB — TYPE AND SCREEN
Antibody Screen: NEGATIVE
Unit division: 0
Unit division: 0
Unit division: 0
Unit division: 0
Unit division: 0

## 2013-10-22 LAB — BASIC METABOLIC PANEL
BUN: 21 mg/dL (ref 6–23)
BUN: 20 mg/dL (ref 6–23)
CO2: 27 mEq/L (ref 19–32)
CO2: 30 mEq/L (ref 19–32)
Calcium: 8.1 mg/dL — ABNORMAL LOW (ref 8.4–10.5)
Calcium: 7.8 mg/dL — ABNORMAL LOW (ref 8.4–10.5)
Chloride: 96 mEq/L (ref 96–112)
Chloride: 99 mEq/L (ref 96–112)
Creatinine, Ser: 1.23 mg/dL (ref 0.50–1.35)
Creatinine, Ser: 1.18 mg/dL (ref 0.50–1.35)
GFR calc Af Amer: 65 mL/min — ABNORMAL LOW (ref >90)
GFR calc non Af Amer: 56 mL/min — ABNORMAL LOW (ref >90)
GFR calc non Af Amer: 59 mL/min — ABNORMAL LOW (ref >90)
Glucose, Bld: 133 mg/dL — ABNORMAL HIGH (ref 70–99)
Glucose, Bld: 116 mg/dL — ABNORMAL HIGH (ref 70–99)
Potassium: 3.6 mEq/L (ref 3.5–5.1)
Potassium: 4 mEq/L (ref 3.5–5.1)
Sodium: 133 mEq/L — ABNORMAL LOW (ref 135–145)

## 2013-10-22 LAB — CBC WITH DIFFERENTIAL/PLATELET
Basophils Absolute: 0 10*3/uL (ref 0.0–0.1)
Basophils Relative: 0 % (ref 0–1)
Eosinophils Absolute: 0 10*3/uL (ref 0.0–0.7)
HCT: 24.6 % — ABNORMAL LOW (ref 39.0–52.0)
Hemoglobin: 8.4 g/dL — ABNORMAL LOW (ref 13.0–17.0)
Lymphocytes Relative: 6 % — ABNORMAL LOW (ref 12–46)
MCH: 30.7 pg (ref 26.0–34.0)
MCHC: 34.1 g/dL (ref 30.0–36.0)
Monocytes Absolute: 1.3 10*3/uL — ABNORMAL HIGH (ref 0.1–1.0)
Monocytes Relative: 13 % — ABNORMAL HIGH (ref 3–12)
Neutro Abs: 8 10*3/uL — ABNORMAL HIGH (ref 1.7–7.7)
Neutrophils Relative %: 80 % — ABNORMAL HIGH (ref 43–77)
Platelets: 72 10*3/uL — ABNORMAL LOW (ref 150–400)
RBC: 2.74 MIL/uL — ABNORMAL LOW (ref 4.22–5.81)
RDW: 17.6 % — ABNORMAL HIGH (ref 11.5–15.5)
WBC: 9.9 10*3/uL (ref 4.0–10.5)

## 2013-10-22 LAB — POCT I-STAT 3, ART BLOOD GAS (G3+)
Acid-Base Excess: 4 mmol/L — ABNORMAL HIGH (ref 0.0–2.0)
Acid-Base Excess: 4 mmol/L — ABNORMAL HIGH (ref 0.0–2.0)
Bicarbonate: 28 mEq/L — ABNORMAL HIGH (ref 20.0–24.0)
O2 Saturation: 94 %
Patient temperature: 97.7
TCO2: 29 mmol/L (ref 0–100)
pH, Arterial: 7.466 — ABNORMAL HIGH (ref 7.350–7.450)
pH, Arterial: 7.482 — ABNORMAL HIGH (ref 7.350–7.450)
pO2, Arterial: 52 mmHg — ABNORMAL LOW (ref 80.0–100.0)
pO2, Arterial: 65 mmHg — ABNORMAL LOW (ref 80.0–100.0)

## 2013-10-22 LAB — GLUCOSE, CAPILLARY
Glucose-Capillary: 109 mg/dL — ABNORMAL HIGH (ref 70–99)
Glucose-Capillary: 87 mg/dL (ref 70–99)
Glucose-Capillary: 109 mg/dL — ABNORMAL HIGH (ref 70–99)

## 2013-10-22 LAB — COMPREHENSIVE METABOLIC PANEL
ALT: 62 U/L — ABNORMAL HIGH (ref 0–53)
AST: 82 U/L — ABNORMAL HIGH (ref 0–37)
Albumin: 3 g/dL — ABNORMAL LOW (ref 3.5–5.2)
Alkaline Phosphatase: 77 U/L (ref 39–117)
BUN: 22 mg/dL (ref 6–23)
CO2: 29 mEq/L (ref 19–32)
Calcium: 8 mg/dL — ABNORMAL LOW (ref 8.4–10.5)
Chloride: 100 mEq/L (ref 96–112)
Creatinine, Ser: 1.24 mg/dL (ref 0.50–1.35)
GFR calc Af Amer: 64 mL/min — ABNORMAL LOW (ref >90)
GFR calc non Af Amer: 56 mL/min — ABNORMAL LOW (ref >90)
Glucose, Bld: 124 mg/dL — ABNORMAL HIGH (ref 70–99)
Potassium: 3.5 mEq/L (ref 3.5–5.1)
Sodium: 136 mEq/L (ref 135–145)
Total Bilirubin: 0.6 mg/dL (ref 0.3–1.2)
Total Protein: 5.4 g/dL — ABNORMAL LOW (ref 6.0–8.3)

## 2013-10-22 LAB — CARBOXYHEMOGLOBIN
Carboxyhemoglobin: 2 % — ABNORMAL HIGH (ref 0.5–1.5)
Carboxyhemoglobin: 1.8 % — ABNORMAL HIGH (ref 0.5–1.5)
Carboxyhemoglobin: 1.5 % (ref 0.5–1.5)
Methemoglobin: 1.4 % (ref 0.0–1.5)
Methemoglobin: 0.9 % (ref 0.0–1.5)
O2 Saturation: 64.1 %
O2 Saturation: 95.4 %
Total hemoglobin: 8.5 g/dL — ABNORMAL LOW (ref 13.5–18.0)
Total hemoglobin: 9.1 g/dL — ABNORMAL LOW (ref 13.5–18.0)

## 2013-10-22 LAB — MAGNESIUM: Magnesium: 2 mg/dL (ref 1.5–2.5)

## 2013-10-22 LAB — PROTIME-INR: Prothrombin Time: 14.4 seconds (ref 11.6–15.2)

## 2013-10-22 LAB — BLOOD PRODUCT ORDER (VERBAL) VERIFICATION

## 2013-10-22 MED ORDER — POTASSIUM CHLORIDE 10 MEQ/50ML IV SOLN
10.0000 meq | INTRAVENOUS | Status: AC | PRN
  Administered 2013-10-22 (×3): 10 meq via INTRAVENOUS

## 2013-10-22 MED ORDER — WARFARIN SODIUM 5 MG PO TABS
5.0000 mg | ORAL_TABLET | Freq: Once | ORAL | Status: AC
  Administered 2013-10-22: 5 mg via ORAL
  Filled 2013-10-22: qty 1

## 2013-10-22 MED ORDER — POTASSIUM CHLORIDE 10 MEQ/50ML IV SOLN: 10.0000 meq | INTRAVENOUS | Status: AC

## 2013-10-22 MED ORDER — POTASSIUM CHLORIDE 10 MEQ/50ML IV SOLN
10.0000 meq | INTRAVENOUS | Status: AC
  Administered 2013-10-22 (×5): 10 meq via INTRAVENOUS
  Filled 2013-10-22: qty 50

## 2013-10-22 MED ORDER — ENSURE COMPLETE PO LIQD
237.0000 mL | Freq: Two times a day (BID) | ORAL | Status: DC
  Administered 2013-10-23 – 2013-11-10 (×6): 237 mL via ORAL

## 2013-10-22 MED ORDER — POTASSIUM CHLORIDE 10 MEQ/50ML IV SOLN
10.0000 meq | INTRAVENOUS | Status: AC
  Administered 2013-10-22 (×3): 10 meq via INTRAVENOUS
  Filled 2013-10-22: qty 50

## 2013-10-22 NOTE — Progress Notes (Signed)
Occupational Therapy Treatment Patient Details Name: [name] MRN: [id number] DOB: June 25, 1941 Today's Date: 10/22/2013 Time: 1191-4782 OT Time Calculation (min): 38 min  OT Assessment / Plan / Recommendation  History of present illness Pt admit for LVAD placement.     OT comments  Pt improved with functional transfers.  Sat EOB x ~ 10 mins with PI 4.4-5.6.  Pt stood with PI slowly dropping to 2.2 with pt asymptomatic.  As pt was transferring to chair he had an episode Vtach with HR 143.  RN present and MD aware.  PI increased to 5.3 with pt sitting in chair.   Follow Up Recommendations  Home health OT;Supervision/Assistance - 24 hour    Barriers to Discharge       Equipment Recommendations  3 in 1 bedside comode    Recommendations for Other Services    Frequency Min 3X/week   Progress towards OT Goals Progress towards OT goals: Progressing toward goals (slowly )  Plan Discharge plan remains appropriate    Precautions / Restrictions Precautions Precautions: None;Fall;Sternal Precaution Comments: LVAD.  mulitple lines   Pertinent Vitals/Pain     ADL  Eating/Feeding: Independent Where Assessed - Eating/Feeding: Chair Toilet Transfer: +2 Total assistance Toilet Transfer: Patient Percentage: 80% Transfers/Ambulation Related to ADLs: Pt transferred to recliner with min A x 2 for safety and monitoring     OT Diagnosis:    OT Problem List:   OT Treatment Interventions:     OT Goals(current goals can now be found in the care plan section) ADL Goals Pt Will Perform Grooming: with supervision;standing Pt Will Perform Upper Body Bathing: with set-up;sitting Pt Will Perform Lower Body Bathing: with supervision;sit to/from stand Pt Will Perform Upper Body Dressing: with set-up;sitting Pt Will Perform Lower Body Dressing: with supervision;sit to/from stand Pt Will Transfer to Toilet: with supervision;ambulating;bedside commode;regular height toilet Pt Will Perform  Toileting - Clothing Manipulation and hygiene: with supervision;sit to/from stand Additional ADL Goal #1: Pt will be independent with managing LVAD equipment during all BADLs  Additional ADL Goal #2: Pt will be independent with sternal precautions during all BADLs   Visit Information  Last OT Received On: 10/22/13 Assistance Needed: +2 PT/OT/SLP Co-Evaluation/Treatment: Yes Reason for Co-Treatment: Complexity of the patient's impairments (multi-system involvement);For patient/therapist safety OT goals addressed during session: Other (comment) (functional transfers) History of Present Illness: Pt admit for LVAD placement.      Subjective Data      Prior Functioning       Cognition  Cognition Arousal/Alertness: Awake/alert Behavior During Therapy: WFL for tasks assessed/performed Overall Cognitive Status: Within Functional Limits for tasks assessed    Mobility  Bed Mobility Bed Mobility: Supine to Sit;Sitting - Scoot to Edge of Bed Supine to Sit: 1: +2 Total assist;HOB elevated Supine to Sit: Patient Percentage: 40% Sitting - Scoot to Edge of Bed: 4: Min assist Details for Bed Mobility Assistance: Pt requires assist to move LEs off bed and assist to lift shoulders from bed.  Pt able to state sternal precautions independently, and was able to adhere to them during bed mobility Transfers Transfers: Sit to Stand;Stand to Sit Sit to Stand: 1: +2 Total assist;Without upper extremity assist;From bed Sit to Stand: Patient Percentage: 90% Stand to Sit: 1: +2 Total assist;Without upper extremity assist;To bed Stand to Sit: Patient Percentage: 80% Details for Transfer Assistance: Pt moved to stand with assist to steady. PI decreased to 2.2 while standing, but pt asymptomatic.  As pt transferred to chair pt with [name]  with HR 148 - RN present.   Pt assisted to sitting position in chair    Exercises      Balance Balance Balance Assessed: Yes Static Sitting Balance Static Sitting -  Balance Support: Feet supported;No upper extremity supported Static Sitting - Level of Assistance: 5: Stand by assistance Static Sitting - Comment/# of Minutes: 10 mins   Pt sat EOB with supervision.  PI 4.2 - 5.4.   HR 123 max.  RN present.  Pt was instructed to keep controller close at all times and how to attach controller to laynard.  Pt did not practice  Static Standing Balance Static Standing - Balance Support: No upper extremity supported Static Standing - Level of Assistance: 1: +2 Total assist Static Standing - Comment/# of Minutes: Total A +2 pt ~90%. PI decreased to 2.2.  Stood ~1.5 min then moved to chair    End of Session OT - End of Session Activity Tolerance: Treatment limited secondary to medical complications (Comment) Patient left: in chair;with call bell/phone within reach Nurse Communication: Mobility status;Other (comment) (MD and RN aware of PI event and V-tach )  GO     [name] M 10/22/2013, 2:21 PM

## 2013-10-22 NOTE — Progress Notes (Signed)
Advanced Heart Failure Rounding Note  Primary EP: Dr. Graciela Husbands  Primary Physician: Dr. [name] ([place], [place])  Reason for Consultation: Heart failure with recurrent VT (syncope)   Subjective:    POD # 3 HM II VAD placement.   Developed sustained VT yesterday requiring DC-CV. VAD speed turned down to 8400 but subsequently increased to 8800/  Did well overnight but feels very fatigued. No orthopnea or PND. Currently on Amiodarone 30 mg.  Dopamine 2 mcg, NO 0.2 ppm, Levophed off yesterday. Diuresing well on IV lasix at 8.   Back in AFL on tele. PI dropped and had brief VT on standing to get up to chair.   MAPs in 80s  CVP 7 Co-ox 64%  Hgb 8.4   VAD interrogated personally Speed 8800 Flow 4.5 Power 4.7 PI 7.2 Alarms: none Events:  Occasional PI events since DC-CV   Objective:     Vital Signs:   Temp:  [97.2 F (36.2 C)-98.2 F (36.8 C)] 98.2 F (36.8 C) (12/18 1636) Pulse Rate:  [58-119] 108 (12/18 1605) Resp:  [7-20] 18 (12/18 1605) BP: (118)/(100) 118/100 mmHg (12/18 1129) SpO2:  [73 %-97 %] 85 % (12/18 1600) Arterial Line BP: (74-109)/(63-91) 98/83 mmHg (12/18 1600) Weight:  [95.2 kg (209 lb 14.1 oz)] 95.2 kg (209 lb 14.1 oz) (12/18 0400) Last BM Date: 10/18/13  Weight change: Filed Weights   10/20/13 0500 10/21/13 0500 10/22/13 0400  Weight: 92.2 kg (203 lb 4.2 oz) 93.8 kg (206 lb 12.7 oz) 95.2 kg (209 lb 14.1 oz)    Intake/Output:   Intake/Output Summary (Last 24 hours) at 10/22/13 1802 Last data filed at 10/22/13 1600  Gross per 24 hour  Intake 2021.1 ml  Output   5410 ml  Net -3388.9 ml     Physical Exam: General:  Awake,Pale and weak HEENT: normal Neck:  No lymphadenopathy or thryomegaly appreciated. +swan Cor:  Irregular rate & regular rhythm. LVAD hum present.  Lungs: clear, breath sounds diminished bilaterally Abdomen: soft, nontender, distended. Right lower abdomen VAD drive line dressing dry/intact with attachment device present.  Positive bowel sounds. Extremities: no cyanosis, clubbing, rash. Hands edematous; extremities cool. 1-2+ edema Neuro: awake, alert.   Telemetry:  AFL 110-120   Labs: Basic Metabolic Panel:  Recent Labs Lab 10/19/13 2242 10/20/13 0400 10/20/13 1623 10/20/13 1643 10/21/13 0405 10/21/13 1145 10/21/13 1329 10/22/13 0445 10/22/13 1600  NA 140 139 136  --  138 136  --  136 133*  K 4.5 5.2* 4.6  --  4.7 4.5  --  3.5 3.6  CL 105 106 108  --  105 102  --  100 96  CO2  --  26  --   --  25 25  --  29 27  GLUCOSE 152* 129* 156*  --  140* 172*  --  124* 133*  BUN 18 19 18   --  22 23  --  22 21  CREATININE 1.40* 1.39* 1.50* 1.43* 1.33 1.28  --  1.24 1.23  CALCIUM  --  8.2*  --   --  8.3* 8.1*  --  8.0* 8.1*  MG  --  2.4  --  2.2 2.2  --  2.0 2.0  --   PHOS  --  3.1  --   --  4.3  --   --  3.3  --     Liver Function Tests:  Recent Labs Lab 10/20/13 0400 10/21/13 1145 10/22/13 0445  AST 233* 149* 82*  ALT  49 82* 62*  ALKPHOS  --  79 77  BILITOT 2.5* 0.6 0.6  PROT  --  5.1* 5.4*  ALBUMIN  --  2.7* 3.0*   No results found for this basename: LIPASE, AMYLASE,  in the last 168 hours No results found for this basename: AMMONIA,  in the last 168 hours  CBC:  Recent Labs Lab 10/20/13 0400  10/20/13 1643 10/21/13 0405 10/21/13 1130 10/21/13 1730 10/22/13 0445  WBC 12.1*  < > 11.3* 13.1* 13.2* 12.1* 9.9  NEUTROABS 8.8*  --   --  9.9*  --   --  8.0*  HGB 8.0*  < > 8.6* 8.4* 8.2* 8.4* 8.4*  HCT 23.3*  < > 25.1* 24.1* 24.0* 24.6* 24.6*  MCV 90.3  < > 89.3 88.9 89.6 90.1 89.8  PLT 126*  < > 93* 109* 89* 82* 72*  < > = values in this interval not displayed.  Cardiac Enzymes: No results found for this basename: CKTOTAL, CKMB, CKMBINDEX, TROPONINI,  in the last 168 hours  BNP: BNP (last 3 results)  Recent Labs  09/15/13 0750 10/12/13 0951 10/20/13 0400  PROBNP 3038.0* 3404.0* 3914.0*    Imaging: Dg Chest Port 1 View  10/22/2013   CLINICAL DATA:  Status post  ventricular assist device placement and chest tubes.  EXAM: PORTABLE CHEST - 1 VIEW  COMPARISON:  One-view chest 10/21/2013.  FINDINGS: A right IJ line, right IJ sheath, and right subclavian PICC line are stable. Bilateral chest tubes and the mediastinal drain are in place. There is no pneumothorax. Aeration is improving at both lung bases. Minimal atelectasis persists, left greater than right. The ventricular assist device is stable in position.  IMPRESSION: 1. Improving aeration at the lung bases bilaterally with persistent atelectasis, left greater than right. 2. The support apparatus is otherwise stable. 3. Stable positioning of the ventricular assist device.   Electronically Signed   By: Gennette Pac M.D.   On: 10/22/2013 08:02   Dg Chest Port 1 View  10/21/2013   CLINICAL DATA:  PICC line placement  EXAM: PORTABLE CHEST - 1 VIEW  COMPARISON:  Portable exam 1837 hr compared to 0 0633 hr  FINDINGS: Right arm PICC line, tip projecting over SVC above cavoatrial junction.  Left thoracostomy tube and mediastinal drainage tube unchanged.  Right jugular central venous catheter x2, tip projecting over SVC.  Left subclavian AICD leads project over right atrium right ventricle.  Left ventricular assist device identified at inferior margin of exam.  Enlargement of cardiac silhouette post median sternotomy.  Pulmonary vascular congestion and diffuse interstitial infiltrates likely pulmonary edema, increased.  No significant pleural effusion or pneumothorax.  IMPRESSION: Tip of right arm PICC line projects over SVC.  Remaining support devices unchanged.  Increased pulmonary edema.   Electronically Signed   By: Ulyses Southward M.D.   On: 10/21/2013 18:56   Dg Chest Port 1 View  10/21/2013   CLINICAL DATA:  Status post insertion of left ventricular assist device.  EXAM: PORTABLE CHEST - 1 VIEW  COMPARISON:  10/20/2013  FINDINGS: Bilateral chest tubes present without evidence of pneumothorax or significant pleural fluid.  There is improved aeration of the left lower lobe was some residual left basilar atelectasis remaining. Cardiac and mediastinal contours are stable. Swan-Ganz catheter tip is in the proximal right pulmonary artery. Visualized portion of the LVAD at the left ventricular apex shows stable positioning.  IMPRESSION: No pneumothorax.  Improved aeration of the left lower lobe.   Electronically  Signed   By: Irish Lack M.D.   On: 10/21/2013 07:59     Medications:     Scheduled Medications: . acetaminophen  1,000 mg Oral Q6H   Or  . acetaminophen (TYLENOL) oral liquid 160 mg/5 mL  1,000 mg Per Tube Q6H  . aspirin EC  325 mg Oral Daily   Or  . aspirin  324 mg Per Tube Daily   Or  . aspirin  300 mg Rectal Daily  . bisacodyl  10 mg Oral Daily   Or  . bisacodyl  10 mg Rectal Daily  . Chlorhexidine Gluconate Cloth  6 each Topical Daily  . docusate sodium  200 mg Oral Daily  . feeding supplement (ENSURE COMPLETE)  237 mL Oral BID BM  . insulin aspart  0-24 Units Subcutaneous Q4H  . metoCLOPramide (REGLAN) injection  10 mg Intravenous Q6H  . mupirocin ointment  1 application Nasal BID  . pantoprazole  40 mg Oral Daily  . potassium chloride  10 mEq Intravenous Q1 Hr x 5  . rifampin  600 mg Oral Once  . sodium chloride  10 mL Intravenous Q12H  . sodium chloride  10-40 mL Intracatheter Q12H  . sodium chloride  3 mL Intravenous Q12H  . Warfarin - Physician Dosing Inpatient   Does not apply q1800    Infusions: . sodium chloride 20 mL/hr at 10/22/13 0800  . sodium chloride 12 mL/hr at 10/22/13 1359  . sodium chloride 250 mL (10/20/13 0600)  . amiodarone (NEXTERONE PREMIX) 360 mg/200 mL dextrose 30 mg/hr (10/22/13 1603)  . DOPamine 2 mcg/kg/min (10/22/13 1600)  . furosemide (LASIX) infusion 8 mg/hr (10/22/13 1600)  . lactated ringers 20 mL/hr at 10/22/13 0800  . nitroGLYCERIN Stopped (10/19/13 1700)  . norepinephrine (LEVOPHED) Adult infusion Stopped (10/21/13 1900)  . 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 He is diuresing well but need to make sure we don't go too fast as he appears continues to have PI events and related suction-driven VT events.   Will continue current regimen for now including lasix, amio and dopamine. Wean dopamine as tolerated.   VAD parameters look OK. Will review echo tonight.  Reglan started yesterday; positive bowel sounds today.  Hgb 8.4 - consider IV iron    The patient is critically ill with multiple organ systems failure and requires high complexity decision making for assessment and support, frequent evaluation and titration of therapies, application of advanced monitoring technologies and extensive interpretation of multiple databases.   Critical Care Time devoted to patient care services described in this note is 35 Minutes.   Arvilla Meres, MD  6:02 PM  Length of Stay: 4  Advanced Heart Failure Team Pager [phone] (M-F; 7a - 4p)

## 2013-10-22 NOTE — Op Note (Signed)
NAMEAREND, BAHL NO.:  [id number]  MEDICAL RECORD NO.:  [id number]  LOCATION:  2S10C                        FACILITY:  [facility]  PHYSICIAN:  Burna Forts, M.D.DATE OF BIRTH:  June 13, 1941  DATE OF PROCEDURE:  10/19/2013 DATE OF DISCHARGE:                              OPERATIVE REPORT   INDICATION FOR PROCEDURE:  Mr. Gardella is a 72 year old gentleman with end-stage heart disease and congestive heart failure who presents for mechanical left ventricular assist device implantation.  This is to be performed by Dr. Evelene Croon and Dr. Kathlee Nations Trigt.  On the morning of surgery, the patient was brought to the holding area where under local Anesthesia, Pulmonary artery and radial arterial lines were placed.  He was then taken to OR for routine induction of general anesthesia, after which the TEE probe is prepared and passed oropharyngeally into the stomach, then slightly withdrawn for imaging of the cardiac structures.  PRECARDIOPULMONARY BYPASS TEE EXAMINATION:  Left ventricle:  The left ventricular chamber is seen initially in the short axis view.  This is a remarkably dilated fairly thin-walled left ventricular chamber at the mid papillary muscle position.  Overall diameter is measured at 8.5 cm in diameter.  There is severe left ventricular dysfunction appreciated. The ejection fraction is estimated to be around 20-22%.  No evidence of thrombus is appreciated in the long and short axis views of the left ventricular chamber.  There is anterior and anterolateral wall contractility that is appreciated.  There is severe inferoseptal and inferior wall hypokinesis.  Overall this is a significantly depressed left ventricular chamber seen in both long and short axis views.  Mitral valve:  The mitral valve seen initially in the four-chamber view. There is a mildly thickened apparatus in both anterior and posterior leaflet observation.  There is fairly normal  excursion of the leaflets during diastolic inflow.  On color Doppler in the four-chamber view, there is 1+ mitral regurgitant flow appreciated.  This is essentially be trivial to mild mitral regurgitant/regurgitation.  Multiple views are carried out of both commissural views and others showing that there are several small flame jets across this valve but nothing accumulates more than about trivial to mild mitral regurgitant flow.  Aortic valve:  The aortic valve is seen in the short axis view.  It is trileaflet.  There is mild aortic sclerosis appreciated.  On color Doppler in this view there is a central aortic jet.  On the long-axis view with the left ventricular outflow tract in the picture we can see a fairly large central jet of aortic insufficiency.  It measures just over 1/3 the width of the LVOT.  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  Left atrium:  The left atrium is noted.  There are no masses appreciated in the left atrium itself.  There are no masses appreciated in the appendage itself.  The interatrial septum is interrogated and is intact. Bubble study, also reveals no evidence of a PFO.  Right ventricle:  This is a moderately dilated right ventricular chamber.  This is seen in the four-chamber view.  There is satisfactory contractility of the right free wall of the right ventricular chamber.  There is a fairly appropriate excursion of the tip upward toward the tricuspid valve doing contractility.  Overall, this is a satisfactorily contractile right ventricular chamber.  The patient is placed on cardiopulmonary bypass.  The procedure is the placement of the LV cannulas through the apex of the ventricle and a outflow cannula into the side of the ascending aorta.  De-airing maneuvers were carried out.  The patient is prepared for separation of cardiopulmonary bypass.  POST CARDIOPULMONARY BYPASS TEE EXAMINATION (DIRECTED VIEW):  In the post bypass  period attention is taken to the left ventricular chamber itself.  The left ventricular inflow cannula is well noted and appears to be appropriately situated in the apex.  Color Doppler shows laminar flow through the mitral valve into the left ventricular chamber to the opening of the left ventricular inflow cannula.  Again it appears to be seated appropriately, flow was directionally appropriate.  Attention is also taken to the aortic outflow cannula area that is attached to the ascending aorta.  The attachment area is seen.  Flow to the outflow cannula is well visualized.  Flow into the ascending aorta is also well visualized.  The aortic valve had been stitched to prevent the aortic regurgitant flow that was apparent in the prebypass.  This illustrates now a trileaflet aortic valve.  There is no flow through the aortic valve. There is no aortic insufficiency through the aortic valve.  Right ventricle:  The right ventricular shows satisfactory contractility well through the post bypass, but well dependent on volume status.  Ultimately, the patient was returned to the cardiac intensive care unit in stable condition.          ______________________________ Burna Forts, M.D.     JTM/MEDQ  D:  10/21/2013  T:  10/22/2013  Job:  [id number]

## 2013-10-22 NOTE — Progress Notes (Signed)
Patient ID: [name], male   DOB: 1941/10/22, 72 y.o.   MRN: [id number]  HeartMate 2 Rounding Note  Subjective:    [name] is a 72 yo man with an ischemic CM s/p ICD implant, chronic systolic HF (EF 20-25%), paroxysmal VT s/p ablation (09/2011) and repeat (06/2013), CAD, PVD (s/p R fem pop BPG 01/2010; s/p L CFA BPG 05/2002), AAA (s/p repair 04/2002), CAS (s/p L CEA 2008), HTN, arthritis, and TIA. Recently admitted 12/8-12/11/14 for syncopal episode from inappropriate ATP which accelerated SVT>>VT with failed ATP and then spontaneous termination of VT. During hospital stay had repeat RHC, hemodynamics were stable and had TEE-DC-CV of AFL 10/15/13 which was successful. He was presented to our [facility] team and felt to be good LVAD candidate and then presented at [facility] transplant listing conference and they felt he was not a candidate for transplant, but good candidate for LVAD under DT criteria.   POD 3 HeartMate II LVAD.  He did well overnight with no PI events since yesterday early afternoon. He remains on  Dop 2 mcg, NO 0.5 ppm. Lasix restarted last pm at 8 mg/hr and he is diuresing well. No arrhythmias overnight on amio 30 mg/hr.  Speed increased to 8800 last night due to PI>8.  Chest tube output 750 cc yesterday but thin serosanguinous. It continues to decrease.   LVAD INTERROGATION:  HeartMate II LVAD:  Flow 4 liters/min, speed 8800, power 5, PI 7.1.   Objective:    Vital Signs:   Temp:  [97.2 F (36.2 C)-98.1 F (36.7 C)] 98.1 F (36.7 C) (12/18 1159) Pulse Rate:  [58-119] 119 (12/18 1129) Resp:  [7-21] 14 (12/18 1200) BP: (118)/(100) 118/100 mmHg (12/18 1129) SpO2:  [74 %-100 %] 74 % (12/18 1100) Arterial Line BP: (72-109)/(66-91) 109/91 mmHg (12/18 1200) Weight:  [95.2 kg (209 lb 14.1 oz)] 95.2 kg (209 lb 14.1 oz) (12/18 0400) Last BM Date: 10/18/13 Mean arterial Pressure 90-99 CVP 9 Co-Ox 64.1   Intake/Output:   Intake/Output Summary (Last 24 hours) at 10/22/13  1357 Last data filed at 10/22/13 1300  Gross per 24 hour  Intake 3042.53 ml  Output   5325 ml  Net -2282.47 ml     Physical Exam: General: Awake and alert  HEENT: normal  Neck: supple. JVP . Carotids 2+ bilat; no bruits. No lymphadenopathy or thryomegaly appreciated.  Cor: No native valve sounds, LVAD hum present.  Lungs: clear  Abdomen: soft, nontender, nondistended. No hepatosplenomegaly. No bruits or masses. Active sounds.  Driveline: C/D/I; securement device intact and driveline incorporated  Extremities: moderate anasarca  Neuro: alert & orientedx3, cranial nerves grossly intact. moves all 4 extremities w/o difficulty. Affect pleasant    Telemetry: A-fib  Labs: Basic Metabolic Panel:  Recent Labs Lab 10/19/13 1710  10/19/13 2242 10/20/13 0400 10/20/13 1623 10/20/13 1643 10/21/13 0405 10/21/13 1145 10/21/13 1329 10/22/13 0445  NA 144  --  140 139 136  --  138 136  --  136  K 3.5  --  4.5 5.2* 4.6  --  4.7 4.5  --  3.5  CL 109  --  105 106 108  --  105 102  --  100  CO2 28  --   --  26  --   --  25 25  --  29  GLUCOSE 39*  --  152* 129* 156*  --  140* 172*  --  124*  BUN 18  --  18 19 18   --  22  23  --  22  CREATININE 1.24  < > 1.40* 1.39* 1.50* 1.43* 1.33 1.28  --  1.24  CALCIUM 8.5  --   --  8.2*  --   --  8.3* 8.1*  --  8.0*  MG 2.0  < >  --  2.4  --  2.2 2.2  --  2.0 2.0  PHOS  --   --   --  3.1  --   --  4.3  --   --  3.3  < > = values in this interval not displayed.  Liver Function Tests:  Recent Labs Lab 10/20/13 0400 10/21/13 1145 10/22/13 0445  AST 233* 149* 82*  ALT 49 82* 62*  ALKPHOS  --  79 77  BILITOT 2.5* 0.6 0.6  PROT  --  5.1* 5.4*  ALBUMIN  --  2.7* 3.0*   No results found for this basename: LIPASE, AMYLASE,  in the last 168 hours No results found for this basename: AMMONIA,  in the last 168 hours  CBC:  Recent Labs Lab 10/20/13 0400  10/20/13 1643 10/21/13 0405 10/21/13 1130 10/21/13 1730 10/22/13 0445  WBC 12.1*  < >  11.3* 13.1* 13.2* 12.1* 9.9  NEUTROABS 8.8*  --   --  9.9*  --   --  8.0*  HGB 8.0*  < > 8.6* 8.4* 8.2* 8.4* 8.4*  HCT 23.3*  < > 25.1* 24.1* 24.0* 24.6* 24.6*  MCV 90.3  < > 89.3 88.9 89.6 90.1 89.8  PLT 126*  < > 93* 109* 89* 82* 72*  < > = values in this interval not displayed.  INR:  Recent Labs Lab 10/19/13 1700 10/20/13 0400 10/20/13 1502 10/21/13 0405 10/22/13 0445  INR 1.15 1.49 1.83* 1.47 1.14   LDH 659 < 726>603  Other results:  EKG:   Imaging: Dg Chest Port 1 View  10/22/2013   CLINICAL DATA:  Status post ventricular assist device placement and chest tubes.  EXAM: PORTABLE CHEST - 1 VIEW  COMPARISON:  One-view chest 10/21/2013.  FINDINGS: A right IJ line, right IJ sheath, and right subclavian PICC line are stable. Bilateral chest tubes and the mediastinal drain are in place. There is no pneumothorax. Aeration is improving at both lung bases. Minimal atelectasis persists, left greater than right. The ventricular assist device is stable in position.  IMPRESSION: 1. Improving aeration at the lung bases bilaterally with persistent atelectasis, left greater than right. 2. The support apparatus is otherwise stable. 3. Stable positioning of the ventricular assist device.   Electronically Signed   By: Gennette Pac M.D.   On: 10/22/2013 08:02   Dg Chest Port 1 View  10/21/2013   CLINICAL DATA:  PICC line placement  EXAM: PORTABLE CHEST - 1 VIEW  COMPARISON:  Portable exam 1837 hr compared to 0 0633 hr  FINDINGS: Right arm PICC line, tip projecting over SVC above cavoatrial junction.  Left thoracostomy tube and mediastinal drainage tube unchanged.  Right jugular central venous catheter x2, tip projecting over SVC.  Left subclavian AICD leads project over right atrium right ventricle.  Left ventricular assist device identified at inferior margin of exam.  Enlargement of cardiac silhouette post median sternotomy.  Pulmonary vascular congestion and diffuse interstitial infiltrates  likely pulmonary edema, increased.  No significant pleural effusion or pneumothorax.  IMPRESSION: Tip of right arm PICC line projects over SVC.  Remaining support devices unchanged.  Increased pulmonary edema.   Electronically Signed   By: Ulyses Southward  M.D.   On: 10/21/2013 18:56   Dg Chest Port 1 View  10/21/2013   CLINICAL DATA:  Status post insertion of left ventricular assist device.  EXAM: PORTABLE CHEST - 1 VIEW  COMPARISON:  10/20/2013  FINDINGS: Bilateral chest tubes present without evidence of pneumothorax or significant pleural fluid. There is improved aeration of the left lower lobe was some residual left basilar atelectasis remaining. Cardiac and mediastinal contours are stable. Swan-Ganz catheter tip is in the proximal right pulmonary artery. Visualized portion of the LVAD at the left ventricular apex shows stable positioning.  IMPRESSION: No pneumothorax.  Improved aeration of the left lower lobe.   Electronically Signed   By: Irish Lack M.D.   On: 10/21/2013 07:59   Dg Chest Port 1 View  10/20/2013   CLINICAL DATA:  LVAD bleeding  EXAM: PORTABLE CHEST - 1 VIEW  COMPARISON:  10/20/2013  FINDINGS: A defibrillator is again noted. A right-sided central venous line and right-sided Swan-Ganz catheter are again noted and stable. The tip of the Swan-Ganz catheter is noted in the pulmonary outflow tract directed towards the right pulmonary artery mediastinal drain and bilateral thoracostomy catheters are again seen. No pneumothorax is noted. Persistent left retro cardiac density is seen. Left ventricular assist device is again identified and unchanged. Endotracheal tube has been removed in the interval as has a nasogastric catheter.  IMPRESSION: Persistent changes in the left retrocardiac region with slight increase in the degree of pleural fluid.  Support apparatus as described stable in appearance.   Electronically Signed   By: Alcide Clever M.D.   On: 10/20/2013 16:36      Medications:      Scheduled Medications: . acetaminophen  1,000 mg Oral Q6H   Or  . acetaminophen (TYLENOL) oral liquid 160 mg/5 mL  1,000 mg Per Tube Q6H  . aspirin EC  325 mg Oral Daily   Or  . aspirin  324 mg Per Tube Daily   Or  . aspirin  300 mg Rectal Daily  . bisacodyl  10 mg Oral Daily   Or  . bisacodyl  10 mg Rectal Daily  . Chlorhexidine Gluconate Cloth  6 each Topical Daily  . docusate sodium  200 mg Oral Daily  . insulin aspart  0-24 Units Subcutaneous Q4H  . metoCLOPramide (REGLAN) injection  10 mg Intravenous Q6H  . mupirocin ointment  1 application Nasal BID  . pantoprazole  40 mg Oral Daily  . rifampin  600 mg Oral Once  . sodium chloride  10 mL Intravenous Q12H  . sodium chloride  10-40 mL Intracatheter Q12H  . sodium chloride  3 mL Intravenous Q12H  . Warfarin - Physician Dosing Inpatient   Does not apply q1800     Infusions: . sodium chloride 20 mL/hr at 10/22/13 0800  . sodium chloride 20 mL/hr at 10/19/13 1700  . sodium chloride 250 mL (10/20/13 0600)  . amiodarone (NEXTERONE PREMIX) 360 mg/200 mL dextrose 30 mg/hr (10/22/13 1300)  . DOPamine 2 mcg/kg/min (10/22/13 1300)  . furosemide (LASIX) infusion 8 mg/hr (10/22/13 0900)  . lactated ringers 20 mL/hr at 10/22/13 0800  . nitroGLYCERIN Stopped (10/19/13 1700)  . norepinephrine (LEVOPHED) Adult infusion Stopped (10/21/13 1900)  . phenylephrine (NEO-SYNEPHRINE) Adult infusion Stopped (10/19/13 1700)     PRN Medications:  morphine injection, ondansetron (ZOFRAN) IV, oxyCODONE, sodium chloride, sodium chloride, sodium chloride   Assessment:  1) Chronic systolic HF, Class IV, EF 20-25%  --s/p HM II VAD implant 12/15  2) NICM/ICM  3) Recurrent Vtach  - ablation 09/2011 and 06/2013  4) Atrial flutter  - s/p DC-CV 10/15/2013  5) CAD with recanalized occluded RCA with left to right collat.  6) PAD: s/p aorto-bifem, bilateral renal artery bypass, bilateral fem-pop, and left CEA.      Plan/Discussion:     Overall he is doing well. CVP is fairly normal at 9 with elevated MAP and PI. Would continue slow diuresis to keep negative but avoid intravascular depletion at this time. He has a small ventricular cavity compared to most patients with cardiomyopathy and may be more prone to suction events and VT. Continue low dose dopamine and NO for now.  Remove posterior mediastinal tube and get OOB.  INR decreased to 1.14. He received 2.5 mg coumadin last night. Will give 5 mg tonight. Will hold off on lovenox or heparin since platelet count has been dropping.   I reviewed the LVAD parameters from today, and compared the results to the patient's prior recorded data.  No programming changes were made.  The LVAD is functioning within specified parameters.  The patient performs LVAD self-test daily.  LVAD interrogation was negative for any significant power changes, alarms or PI events/speed drops.  LVAD equipment check completed and is in good working order.  Back-up equipment present.   LVAD education done on emergency procedures and precautions and reviewed exit site care.  Length of Stay: 4  BARTLE,BRYAN K 10/22/2013, 1:57 PM

## 2013-10-22 NOTE — Progress Notes (Signed)
Patient having occasional 4-6 beat runs of Vtach, asymptomatic.  MAP 94. Dr Laneta Simmers notified. K 3.6 from 1600 labs, 5 runs K ordered.  Will continue to monitor.

## 2013-10-22 NOTE — Progress Notes (Signed)
Physical Therapy Treatment Patient Details Name: [name] MRN: [id number] DOB: 1941/07/22 Today's Date: 10/22/2013 Time: 7829-5621 PT Time Calculation (min): 38 min  PT Assessment / Plan / Recommendation  History of Present Illness Pt admit for LVAD placement.     PT Comments   Pt continues to have decr PI with standing, however not symptomatic (dizzy) today. Did develop v-tach as pivoting to chair. Discussed issues with Dr Gala Romney and clarified OK to proceed with activity if PI drops >2.0 only if pt is asymptomatic and it "rebounds" by 2.0 within ~ 1 minute. If remains low longer than that, LVAD pump will pull septum of heart into pump and cause arrhythmias. He wants continued mobility within these parameters.   Follow Up Recommendations  Home health PT;Supervision/Assistance - 24 hour     Does the patient have the potential to tolerate intense rehabilitation     Barriers to Discharge        Equipment Recommendations  None recommended by PT    Recommendations for Other Services    Frequency Min 5X/week   Progress towards PT Goals Progress towards PT goals: Progressing toward goals  Plan Current plan remains appropriate    Precautions / Restrictions Precautions Precautions: Sternal;Fall Precaution Comments: LVAD.  mulitple lines Restrictions Weight Bearing Restrictions: No   Pertinent Vitals/Pain See mobility notes below re: PI and HR response to activity SaO2 WNL on O2 with NO   Mobility  Bed Mobility Bed Mobility: Supine to Sit;Sitting - Scoot to Edge of Bed Supine to Sit: 1: +2 Total assist;HOB elevated Supine to Sit: Patient Percentage: 40% Sitting - Scoot to Edge of Bed: 4: Min assist Details for Bed Mobility Assistance: Pt requires assist to move LEs off bed and assist to lift shoulders from bed.  Pt able to state sternal precautions independently, and was able to adhere to them during bed mobility. Not yet rolling and side to sit due to numerous  lines/tubes/equipment Transfers Transfers: Sit to Stand;Stand to Sit;Stand Pivot Transfers Sit to Stand: 1: +2 Total assist;Without upper extremity assist;From bed Sit to Stand: Patient Percentage: 90% Stand to Sit: 1: +2 Total assist;Without upper extremity assist;To bed Stand to Sit: Patient Percentage: 80% Stand Pivot Transfers: 1: +2 Total assist Stand Pivot Transfers: Patient Percentage: 90% Details for Transfer Assistance: Pt moved to stand with assist to steady. PI decreased to 2.2 while standing, but pt asymptomatic.  As pt transferred to chair pt with VTach with HR 148 - RN present.   Pt assisted to sitting position in chair. PI incr to 4.2 within 30 seconds and 6.1 within 2 minutes. Ambulation/Gait Ambulation/Gait Assistance: Not tested (comment)    Exercises     PT Diagnosis:    PT Problem List:   PT Treatment Interventions:     PT Goals (current goals can now be found in the care plan section)    Visit Information  Last PT Received On: 10/22/13 Assistance Needed: +2 PT/OT/SLP Co-Evaluation/Treatment: Yes Reason for Co-Treatment: Complexity of the patient's impairments (multi-system involvement);For patient/therapist safety PT goals addressed during session: Mobility/safety with mobility;Balance OT goals addressed during session: Other (comment) (functional transfers) History of Present Illness: Pt admit for LVAD placement.      Subjective Data  Subjective: "Ill tell you if I don't feel well"   Cognition  Cognition Arousal/Alertness: Awake/alert Behavior During Therapy: WFL for tasks assessed/performed Overall Cognitive Status: Within Functional Limits for tasks assessed    Balance  Balance Balance Assessed: Yes Static Sitting Balance Static  Sitting - Balance Support: Feet supported;No upper extremity supported Static Sitting - Level of Assistance: 5: Stand by assistance Static Sitting - Comment/# of Minutes: 10 mins   Pt sat EOB with supervision.  PI 4.2 -  5.4.   HR 123 max.  RN present.  Pt was instructed to keep controller close at all times and how to attach controller to laynard.  Pt did not practice  Static Standing Balance Static Standing - Balance Support: No upper extremity supported Static Standing - Level of Assistance: 1: +2 Total assist Static Standing - Comment/# of Minutes: Total A +2 pt ~90%. PI decreased to 2.2. Stood ~1.5 min then moved to chair   End of Session PT - End of Session Equipment Utilized During Treatment: Oxygen;Other (comment) (unable to don gait belt due to multiple CT, driveline, stern) Activity Tolerance: Patient limited by fatigue;Treatment limited secondary to medical complications (Comment) Patient left: in chair;with call bell/phone within reach;with nursing/sitter in room Nurse Communication: Mobility status;Other (comment) (discussed with Dr Gala Romney)   GP     [name] 10/22/2013, 3:55 PM Pager [phone]

## 2013-10-22 NOTE — Progress Notes (Signed)
Agree  [name] 6:13 PM

## 2013-10-23 ENCOUNTER — Inpatient Hospital Stay (HOSPITAL_COMMUNITY): Payer: Medicare Other

## 2013-10-23 DIAGNOSIS — I5023 Acute on chronic systolic (congestive) heart failure: Secondary | ICD-10-CM

## 2013-10-23 DIAGNOSIS — Z95818 Presence of other cardiac implants and grafts: Secondary | ICD-10-CM

## 2013-10-23 LAB — CBC WITH DIFFERENTIAL/PLATELET
Basophils Relative: 0 % (ref 0–1)
Eosinophils Absolute: 0.2 10*3/uL (ref 0.0–0.7)
Eosinophils Relative: 2 % (ref 0–5)
Hemoglobin: 8.4 g/dL — ABNORMAL LOW (ref 13.0–17.0)
Lymphs Abs: 0.6 10*3/uL — ABNORMAL LOW (ref 0.7–4.0)
MCH: 30.7 pg (ref 26.0–34.0)
MCV: 90.9 fL (ref 78.0–100.0)
Monocytes Relative: 14 % — ABNORMAL HIGH (ref 3–12)
Neutrophils Relative %: 77 % (ref 43–77)
RBC: 2.74 MIL/uL — ABNORMAL LOW (ref 4.22–5.81)
RDW: 17.3 % — ABNORMAL HIGH (ref 11.5–15.5)

## 2013-10-23 LAB — BLOOD GAS, ARTERIAL
Acid-Base Excess: 7.7 mmol/L — ABNORMAL HIGH (ref 0.0–2.0)
Bicarbonate: 31.5 mEq/L — ABNORMAL HIGH (ref 20.0–24.0)
Nitric Oxide: 0.5
O2 Content: 6 L/min
Patient temperature: 98.6
TCO2: 32.8 mmol/L (ref 0–100)
pCO2 arterial: 42.6 mmHg (ref 35.0–45.0)

## 2013-10-23 LAB — COMPREHENSIVE METABOLIC PANEL
ALT: 40 U/L (ref 0–53)
AST: 36 U/L (ref 0–37)
Albumin: 2.9 g/dL — ABNORMAL LOW (ref 3.5–5.2)
Alkaline Phosphatase: 83 U/L (ref 39–117)
BUN: 18 mg/dL (ref 6–23)
CO2: 30 mEq/L (ref 19–32)
Calcium: 7.9 mg/dL — ABNORMAL LOW (ref 8.4–10.5)
Chloride: 99 mEq/L (ref 96–112)
Creatinine, Ser: 1.15 mg/dL (ref 0.50–1.35)
GFR calc Af Amer: 71 mL/min — ABNORMAL LOW (ref >90)
GFR calc non Af Amer: 61 mL/min — ABNORMAL LOW (ref >90)
Glucose, Bld: 113 mg/dL — ABNORMAL HIGH (ref 70–99)
Potassium: 3.5 mEq/L (ref 3.5–5.1)
Sodium: 136 mEq/L (ref 135–145)
Total Bilirubin: 0.6 mg/dL (ref 0.3–1.2)
Total Protein: 5.3 g/dL — ABNORMAL LOW (ref 6.0–8.3)

## 2013-10-23 LAB — CARBOXYHEMOGLOBIN
Carboxyhemoglobin: 1.5 % (ref 0.5–1.5)
Methemoglobin: 0.6 % (ref 0.0–1.5)
O2 Saturation: 59 %
O2 Saturation: 56.4 %
Total hemoglobin: 14 g/dL (ref 13.5–18.0)
Total hemoglobin: 11.5 g/dL — ABNORMAL LOW (ref 13.5–18.0)

## 2013-10-23 LAB — GLUCOSE, CAPILLARY
Glucose-Capillary: 100 mg/dL — ABNORMAL HIGH (ref 70–99)
Glucose-Capillary: 103 mg/dL — ABNORMAL HIGH (ref 70–99)

## 2013-10-23 LAB — PHOSPHORUS: Phosphorus: 2 mg/dL — ABNORMAL LOW (ref 2.3–4.6)

## 2013-10-23 LAB — MAGNESIUM: Magnesium: 1.8 mg/dL (ref 1.5–2.5)

## 2013-10-23 LAB — PROTIME-INR: INR: 1.26 (ref 0.00–1.49)

## 2013-10-23 MED ORDER — POTASSIUM CHLORIDE 10 MEQ/50ML IV SOLN
10.0000 meq | INTRAVENOUS | Status: AC
  Administered 2013-10-23 (×3): 10 meq via INTRAVENOUS

## 2013-10-23 MED ORDER — POTASSIUM CHLORIDE 10 MEQ/50ML IV SOLN
10.0000 meq | INTRAVENOUS | Status: AC
  Administered 2013-10-23 (×3): 10 meq via INTRAVENOUS
  Filled 2013-10-23 (×2): qty 50

## 2013-10-23 MED ORDER — MIDODRINE HCL 2.5 MG PO TABS
2.5000 mg | ORAL_TABLET | Freq: Three times a day (TID) | ORAL | Status: DC
  Administered 2013-10-23 – 2013-10-28 (×14): 2.5 mg via ORAL
  Filled 2013-10-23 (×17): qty 1

## 2013-10-23 MED ORDER — WARFARIN SODIUM 5 MG PO TABS
5.0000 mg | ORAL_TABLET | Freq: Once | ORAL | Status: AC
  Administered 2013-10-23: 5 mg via ORAL
  Filled 2013-10-23: qty 1

## 2013-10-23 MED ORDER — FUROSEMIDE 10 MG/ML IJ SOLN
20.0000 mg | Freq: Two times a day (BID) | INTRAMUSCULAR | Status: DC
  Administered 2013-10-23 – 2013-10-26 (×7): 20 mg via INTRAVENOUS
  Filled 2013-10-23 (×9): qty 2

## 2013-10-23 NOTE — Progress Notes (Signed)
Physical Therapy Treatment Patient Details Name: [name] MRN: [id number] DOB: [birth year] Today's Date: 10/23/2013 Time: 4098-1191 PT Time Calculation (min): 28 min  PT Assessment / Plan / Recommendation  History of Present Illness Pt admit for LVAD placement.     PT Comments   Pt's activity continues to be limited by decreased PI with all mobility.  Pt with PI dropping from 6.8 to 2.4 while sitting EOB. PI did not recover within one minute and pt with complaint of dizziness and "eyes spinning;" therefore, pt returned to sidelying. HR 129 max Afib, ? A flutter. PI recovered to 6.2 after ~ 5 mins with pt in supine. Continued to report that objects were moving, however could not detect any nystagmus. Pt rolled to Rt for positioning, with PI dropping from 5.8 to 4.0    Follow Up Recommendations  Home health PT;Supervision/Assistance - 24 hour     Does the patient have the potential to tolerate intense rehabilitation     Barriers to Discharge        Equipment Recommendations  None recommended by PT    Recommendations for Other Services    Frequency Min 5X/week   Progress towards PT Goals Progress towards PT goals: Not progressing toward goals - comment (limited by decr PI)  Plan Current plan remains appropriate (however if limited tolerance cont's, may need CIR)    Precautions / Restrictions Precautions Precautions: Sternal;Fall Precaution Comments: LVAD.  mulitple lines Required Braces or Orthoses: Other Brace/Splint Other Brace/Splint: strap for controller; vest for batteries; needs to take bag with back up supplies with him whenever he leaves his room   Pertinent Vitals/Pain See above re: PI event    Mobility  Bed Mobility Bed Mobility: Rolling Right;Right Sidelying to Sit;Sitting - Scoot to [place] of Bed;Sit to Sidelying Right Rolling Right: 2: Max assist Right Sidelying to Sit: 1: +2 Total assist Right Sidelying to Sit: Patient Percentage: 60% Sitting - Scoot to  Edge of Bed: 4: Min assist Sit to Sidelying Right: 1: +2 Total assist Sit to Sidelying Right: Patient Percentage: 20% Details for Bed Mobility Assistance: Pt able to assist moving LEs off bed.  Required assist for UB.  Requires assist to roll.  Pt rolled onto rt side at end of session with PI dropping from 6.2 to 4.0    Exercises     PT Diagnosis:    PT Problem List:   PT Treatment Interventions:     PT Goals (current goals can now be found in the care plan section)    Visit Information  Last PT Received On: 10/23/13 Assistance Needed: +2 PT/OT/SLP Co-Evaluation/Treatment: Yes Reason for Co-Treatment: Complexity of the patient's impairments (multi-system involvement) PT goals addressed during session: Mobility/safety with mobility OT goals addressed during session: Other (comment) (functional mobility and transfers) History of Present Illness: Pt admit for LVAD placement.      Subjective Data      Cognition  Cognition Arousal/Alertness: Awake/alert Behavior During Therapy: Flat affect Overall Cognitive Status: Within Functional Limits for tasks assessed    Balance  Balance Balance Assessed: Yes Static Sitting Balance Static Sitting - Balance Support: Feet supported Static Sitting - Level of Assistance: 5: Stand by assistance;4: Min assist Static Sitting - Comment/# of Minutes: Pt sat EOB x 4 mins. PI slowly dropped from 6.8 to 2.4 and did not recover after ~ 1 min. Pt with complaint of dizziness. HR with to 129 A-Fib. Pt returned to sidelying. PI recovered to 6.2 after ~ 5 mins  End of Session PT - End of Session Equipment Utilized During Treatment: Oxygen Activity Tolerance: Treatment limited secondary to medical complications (Comment) (decr PI limiting mobility) Patient left: in bed;with call bell/phone within reach;with nursing/sitter in room   GP     [name] 10/23/2013, 2:35 PM Pager [phone]

## 2013-10-23 NOTE — Progress Notes (Signed)
Patient ID: [name], male   DOB: 1941/02/17, 72 y.o.   MRN: [id number]  SICU Evening Rounds  MAP 72, CVP 5.  Urine output decreased this afternoon to 10 and 20 cc the last two hrs.  Co-ox 56.4 this afternoon off dopamine and NO. It was 59 before that at 4 am.  He is eating a little soup  He did not get up today due to presyncope, decrease in PI and MAP when he sat up.  I think he is intravascularly dry with significant 3rd space fluid. He is on lasix 20 bid.  Continue current course and see how things look in the am.

## 2013-10-23 NOTE — Progress Notes (Addendum)
Nitric discontinued per MD order. Nitric left in room at bedside with 1450 psi remaining in tank. Patient stable and resting comfortably. Will continue to monitor.

## 2013-10-23 NOTE — Progress Notes (Signed)
VAD coordinator met with patient's significant other Hilda Lias) and demonstrated dressing change using sterile technique.   Dressing removed, site  cleansed with Chlora prep applicators x 2, dried, and guaze dressing with aquacel strip replaced.  The site has no tissue ingrowth, two sutures intact, small amount erythema at suture sites and small amount bloody drainage. No tenderness or foul odor noted; anchor device in place. The velour is fully implanted at exit site.  Stressed importance of always washing hands prior to procedure, using sterile technique, and securing driveline in attachment device allowing for gentle curve to promote complete tissue ingrowth and decrease chance of driveline infection.  Asked that she perform daily dressing changes daily while patient is in the hospital under nursing supervision, she agreed to do so.  Hilda Lias changed power source from power module to batteries and from batteries to power module; stressed importance of never disconnecting both power cables at same time. She also performed system controller and power module self test without difficulty.   Instructed Hilda Lias to complete reading of the HM II Patient Handbook, she is in the process of reading now and has no questions at this time.   We will schedule VAD discharge teaching with patient and family when patient moves from ICU to 2W.

## 2013-10-23 NOTE — Progress Notes (Signed)
Occupational Therapy Treatment Patient Details Name: [name] MRN: [id number] DOB: 08-01-1941 Today's Date: 10/23/2013 Time: 4098-1191 OT Time Calculation (min): 28 min  OT Assessment / Plan / Recommendation  History of present illness Pt admit for LVAD placement.     OT comments  Pt with PI dropping from 6.8 to 2.4 while sitting EOB.  PI did not recover and pt with complaint of dizziness; therefore, pt returned to sidelying.  HR 129 max Afib, ? A flutter.  PI recovered to 6.2 after ~ 5 mins with pt in supine.  Pt rolled to Rt for positioning, with PI dropping from 5.8 to 4.0  Follow Up Recommendations  Home health OT;Supervision/Assistance - 24 hour    Barriers to Discharge       Equipment Recommendations  3 in 1 bedside comode    Recommendations for Other Services    Frequency Min 3X/week   Progress towards OT Goals Progress towards OT goals: Not progressing toward goals - comment (due to medical issues)  Plan Discharge plan remains appropriate    Precautions / Restrictions Precautions Precautions: Sternal;Fall Precaution Comments: LVAD.  mulitple lines   Pertinent Vitals/Pain     ADL  Eating/Feeding: Independent ADL Comments: Pt only tolerated moving to EOB today with PI decreasing from 6.8 to 2.4.  Pt did not recover within 1+ mins so returned to supine     OT Diagnosis:    OT Problem List:   OT Treatment Interventions:     OT Goals(current goals can now be found in the care plan section) ADL Goals Pt Will Perform Grooming: with supervision;standing Pt Will Perform Upper Body Bathing: with set-up;sitting Pt Will Perform Lower Body Bathing: with supervision;sit to/from stand Pt Will Perform Upper Body Dressing: with set-up;sitting Pt Will Perform Lower Body Dressing: with supervision;sit to/from stand Pt Will Transfer to Toilet: with supervision;ambulating;bedside commode;regular height toilet Pt Will Perform Toileting - Clothing Manipulation and hygiene:  with supervision;sit to/from stand Additional ADL Goal #1: Pt will be independent with managing LVAD equipment during all BADLs  Additional ADL Goal #2: Pt will be independent with sternal precautions during all BADLs   Visit Information  Last OT Received On: 10/23/13 Assistance Needed: +2 PT/OT/SLP Co-Evaluation/Treatment: Yes Reason for Co-Treatment: Complexity of the patient's impairments (multi-system involvement) OT goals addressed during session: Other (comment) (functional mobility and transfers) History of Present Illness: Pt admit for LVAD placement.      Subjective Data      Prior Functioning       Cognition  Cognition Arousal/Alertness: Awake/alert Behavior During Therapy: WFL for tasks assessed/performed Overall Cognitive Status: Within Functional Limits for tasks assessed    Mobility  Bed Mobility Bed Mobility: Rolling Right;Right Sidelying to Sit;Sitting - Scoot to [place] of Bed;Sit to Sidelying Right Rolling Right: 2: Max assist Right Sidelying to Sit: 1: +2 Total assist Right Sidelying to Sit: Patient Percentage: 60% Sitting - Scoot to Edge of Bed: 4: Min assist Sit to Sidelying Right: 1: +2 Total assist Sit to Sidelying Right: Patient Percentage: 20% Details for Bed Mobility Assistance: Pt able to assist moving LEs off bed.  Required assist for UB.  Requires assist to roll.  Pt rolled onto rt side at end of session with PI dropping from 6.2 to 4.0 Transfers Transfers: Not assessed    Exercises      Balance Balance Balance Assessed: Yes Static Sitting Balance Static Sitting - Balance Support: Feet supported Static Sitting - Level of Assistance: 5: Stand by assistance;4: Min  assist Static Sitting - Comment/# of Minutes: Pt sat EOB x 4 mins.  PI slowly dropped from 6.8 to 2.4 and did not recover after ~ 1 min.  Pt with complaint of dizziness.  HR with to 129 A-Fib.  Pt returned to sidelying.  PI recovered to 6.2 after ~ 5 mins.      End of Session OT - End  of Session Activity Tolerance: Treatment limited secondary to medical complications (Comment) Patient left: in bed;with call bell/phone within reach;with nursing/sitter in room Nurse Communication: Mobility status  GO     [name] M 10/23/2013, 12:57 PM

## 2013-10-23 NOTE — Progress Notes (Signed)
Advanced Heart Failure Rounding Note  Primary EP: Dr. Graciela Husbands  Primary Physician: Dr. [name] ([place], [place])  Reason for Consultation: Heart failure with recurrent VT (syncope)   Subjective:    POD # 4 HM II VAD placement.   Short runs VT early this morning, Lasix gtt stopped at 06:00.  VAD speed remains 8800 with few PI events yesterday last 24 hours.  Did well overnight but feels very fatigued. No orthopnea or PND. Currently on Amiodarone 30 mg.  Dopamine and NO off  Back in AFL on tele. PI dropped and had brief VT on standing to get up to chair yesterday. Will continue getting OOB and monitor closely.  MAPs in 80 - 90's CVP 10 Co-ox 59%  Hgb 8.3   VAD interrogated personally Speed 8800 Flow 3.6 Power 4.3 PI 7.4 Alarms: none Events:  Occasional PI events    Objective:     Vital Signs:   Temp:  [97.2 F (36.2 C)-98.8 F (37.1 C)] 97.2 F (36.2 C) (12/19 0758) Pulse Rate:  [76-134] 104 (12/19 0802) Resp:  [9-25] 13 (12/19 1100) BP: (93)/(74) 93/74 mmHg (12/19 0802) SpO2:  [73 %-100 %] 100 % (12/19 0802) Arterial Line BP: (71-109)/(63-91) 85/71 mmHg (12/19 1000) Last BM Date: 10/18/13  Weight change: Filed Weights   10/20/13 0500 10/21/13 0500 10/22/13 0400  Weight: 92.2 kg (203 lb 4.2 oz) 93.8 kg (206 lb 12.7 oz) 95.2 kg (209 lb 14.1 oz)    Intake/Output:   Intake/Output Summary (Last 24 hours) at 10/23/13 1156 Last data filed at 10/23/13 1000  Gross per 24 hour  Intake 2091.7 ml  Output   3510 ml  Net -1418.3 ml     Physical Exam: General:  Awake,Pale and weak HEENT: normal Neck:  No lymphadenopathy or thryomegaly appreciated. +swan Cor:  Irregular rate & regular rhythm. LVAD hum present.  Lungs: clear, breath sounds diminished bilaterally Abdomen: soft, nontender, distended. Right lower abdomen VAD drive line dressing dry/intact with attachment device present. Positive bowel sounds. Extremities: no cyanosis, clubbing, rash. Hands edematous;  extremities warm. 1-2+ edema Neuro: awake, alert.   Telemetry:  AFL 90 - 110   Labs: Basic Metabolic Panel:  Recent Labs Lab 10/19/13 2242 10/20/13 0400  10/20/13 1643 10/21/13 0405 10/21/13 1145 10/21/13 1329 10/22/13 0445 10/22/13 1600 10/22/13 2200 10/23/13 0410  NA 140 139  < >  --  138 136  --  136 133* 136 136  K 4.5 5.2*  < >  --  4.7 4.5  --  3.5 3.6 4.0 3.5  CL 105 106  < >  --  105 102  --  100 96 99 99  CO2  --  26  --   --  25 25  --  29 27 30 30   GLUCOSE 152* 129*  < >  --  140* 172*  --  124* 133* 116* 113*  BUN 18 19  < >  --  22 23  --  22 21 20 18   CREATININE 1.40* 1.39*  < > 1.43* 1.33 1.28  --  1.24 1.23 1.18 1.15  CALCIUM  --  8.2*  --   --  8.3* 8.1*  --  8.0* 8.1* 7.8* 7.9*  MG  --  2.4  --  2.2 2.2  --  2.0 2.0  --   --  1.8  PHOS  --  3.1  --   --  4.3  --   --  3.3  --   --  2.0*  < > = values in this interval not displayed.  Liver Function Tests:  Recent Labs Lab 10/20/13 0400 10/21/13 1145 10/22/13 0445 10/23/13 0410  AST 233* 149* 82* 36  ALT 49 82* 62* 40  ALKPHOS  --  79 77 83  BILITOT 2.5* 0.6 0.6 0.6  PROT  --  5.1* 5.4* 5.3*  ALBUMIN  --  2.7* 3.0* 2.9*   No results found for this basename: LIPASE, AMYLASE,  in the last 168 hours No results found for this basename: AMMONIA,  in the last 168 hours  CBC:  Recent Labs Lab 10/20/13 0400  10/21/13 0405 10/21/13 1130 10/21/13 1730 10/22/13 0445 10/23/13 0410  WBC 12.1*  < > 13.1* 13.2* 12.1* 9.9 8.3  NEUTROABS 8.8*  --  9.9*  --   --  8.0* 6.4  HGB 8.0*  < > 8.4* 8.2* 8.4* 8.4* 8.4*  HCT 23.3*  < > 24.1* 24.0* 24.6* 24.6* 24.9*  MCV 90.3  < > 88.9 89.6 90.1 89.8 90.9  PLT 126*  < > 109* 89* 82* 72* 77*  < > = values in this interval not displayed.  Cardiac Enzymes: No results found for this basename: CKTOTAL, CKMB, CKMBINDEX, TROPONINI,  in the last 168 hours  BNP: BNP (last 3 results)  Recent Labs  09/15/13 0750 10/12/13 0951 10/20/13 0400  PROBNP 3038.0*  3404.0* 3914.0*    Imaging: Dg Chest Port 1 View  10/23/2013   CLINICAL DATA:  Status post left ventricular assist device placement  EXAM: PORTABLE CHEST - 1 VIEW  COMPARISON:  Chest x-ray of October 22, 2013  FINDINGS: The lungs are well-expanded. The interstitial markings are increased diffusely and are slightly more conspicuous than on yesterday's study. Bilateral chest tubes are unchanged in position. There is no evidence of a pneumothorax. The retrocardiac region on the left remains dense and the left hemidiaphragm is partially obscured today but patient positioning limits evaluation of the lateral aspect of the left hemidiaphragm. The cardiopericardial silhouette remains enlarged. A permanent pacemaker defibrillator is in place. A PICC line is present with the tip in the region of the proximal SVC. A right internal jugular venous catheter and a Cordis sheath are in place. A mediastinal drain is present. Seven intact sternal wires are visible. An inferior left chest tube previously demonstrated is not clearly included in the field of view today.  IMPRESSION: 1. There has been interval deterioration in the appearance of the pulmonary interstitium suggesting increasing pulmonary interstitial edema. 2. The support tubes and lines are in reasonable position as described. 3. The cardiopericardial silhouette remains enlarged. The left hemidiaphragm is less well demonstrated today.   Electronically Signed   By: David  [place]   On: 10/23/2013 07:52   Dg Chest Port 1 View  10/22/2013   CLINICAL DATA:  Status post ventricular assist device placement and chest tubes.  EXAM: PORTABLE CHEST - 1 VIEW  COMPARISON:  One-view chest 10/21/2013.  FINDINGS: A right IJ line, right IJ sheath, and right subclavian PICC line are stable. Bilateral chest tubes and the mediastinal drain are in place. There is no pneumothorax. Aeration is improving at both lung bases. Minimal atelectasis persists, left greater than right. The  ventricular assist device is stable in position.  IMPRESSION: 1. Improving aeration at the lung bases bilaterally with persistent atelectasis, left greater than right. 2. The support apparatus is otherwise stable. 3. Stable positioning of the ventricular assist device.   Electronically Signed   By: Thayer Ohm  Mattern M.D.   On: 10/22/2013 08:02   Dg Chest Port 1 View  10/21/2013   CLINICAL DATA:  PICC line placement  EXAM: PORTABLE CHEST - 1 VIEW  COMPARISON:  Portable exam 1837 hr compared to 0 0633 hr  FINDINGS: Right arm PICC line, tip projecting over SVC above cavoatrial junction.  Left thoracostomy tube and mediastinal drainage tube unchanged.  Right jugular central venous catheter x2, tip projecting over SVC.  Left subclavian AICD leads project over right atrium right ventricle.  Left ventricular assist device identified at inferior margin of exam.  Enlargement of cardiac silhouette post median sternotomy.  Pulmonary vascular congestion and diffuse interstitial infiltrates likely pulmonary edema, increased.  No significant pleural effusion or pneumothorax.  IMPRESSION: Tip of right arm PICC line projects over SVC.  Remaining support devices unchanged.  Increased pulmonary edema.   Electronically Signed   By: Ulyses Southward M.D.   On: 10/21/2013 18:56     Medications:     Scheduled Medications: . acetaminophen  1,000 mg Oral Q6H   Or  . acetaminophen (TYLENOL) oral liquid 160 mg/5 mL  1,000 mg Per Tube Q6H  . aspirin EC  325 mg Oral Daily   Or  . aspirin  324 mg Per Tube Daily   Or  . aspirin  300 mg Rectal Daily  . bisacodyl  10 mg Oral Daily   Or  . bisacodyl  10 mg Rectal Daily  . docusate sodium  200 mg Oral Daily  . feeding supplement (ENSURE COMPLETE)  237 mL Oral BID BM  . furosemide  20 mg Intravenous BID  . insulin aspart  0-24 Units Subcutaneous Q4H  . metoCLOPramide (REGLAN) injection  10 mg Intravenous Q6H  . pantoprazole  40 mg Oral Daily  . potassium chloride  10 mEq  Intravenous Q1 Hr x 3  . sodium chloride  10 mL Intravenous Q12H  . sodium chloride  10-40 mL Intracatheter Q12H  . sodium chloride  3 mL Intravenous Q12H  . warfarin  5 mg Oral ONCE-1800  . Warfarin - Physician Dosing Inpatient   Does not apply q1800    Infusions: . sodium chloride 20 mL/hr at 10/22/13 0800  . sodium chloride 12 mL/hr at 10/22/13 1359  . sodium chloride 250 mL (10/20/13 0600)  . amiodarone (NEXTERONE PREMIX) 360 mg/200 mL dextrose 30 mg/hr (10/23/13 0700)  . DOPamine 1 mcg/kg/min (10/23/13 0700)  . furosemide (LASIX) infusion Stopped (10/23/13 0600)  . lactated ringers 20 mL/hr at 10/22/13 0800  . nitroGLYCERIN Stopped (10/19/13 1700)  . 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 He is diuresing well but need to make sure we don't go too fast as he appears continues to have PI events and related suction-driven VT events. Agree with switching lasix to 20 bid. Would continue IV amio for now until taking pos well. Can d/c IV lines except for PICC.   PharmD dosing coumadin, INR 1.26; plts remain low at 77  VAD parameters look OK  Continue to  work with PT. Watch for suction events while standing.    Arvilla Meres, MD 11:56 AM  Length of Stay: 5  Advanced Heart Failure Team Pager [phone] (M-F; 7a - 4p)

## 2013-10-23 NOTE — Progress Notes (Signed)
CSW met with patient and Hilda Lias (significant other) at bedside. Patient states feeling better but still weak. [name] (SO) stated that she did the dressing changes and changed the power supply this morning with Kirt Boys (VAD Coordinator). She was feeling very positive about the teaching and stated that "Bethany even slept through it". Patient and caregiver appear to be coping well post surgery and realistic with recovery process. CSW provided support and will continue to follow through recovery. Gae Dry [phone]

## 2013-10-23 NOTE — Progress Notes (Signed)
Patient ID: [name], male   DOB: [birth year], 72 y.o.   MRN: [id number]  HeartMate 2 Rounding Note  Subjective:    [name] is a 72 yo man with an ischemic CM s/p ICD implant, chronic systolic HF (EF 20-25%), paroxysmal VT s/p ablation (09/2011) and repeat (06/2013), CAD, PVD (s/p R fem pop BPG 01/2010; s/p L CFA BPG 05/2002), AAA (s/p repair 04/2002), CAS (s/p L CEA 2008), HTN, arthritis, and TIA. Recently admitted 12/8-12/11/14 for syncopal episode from inappropriate ATP which accelerated SVT>>VT with failed ATP and then spontaneous termination of VT. During hospital stay had repeat RHC, hemodynamics were stable and had TEE-DC-CV of AFL 10/15/13 which was successful. He was presented to our [facility] team and felt to be good LVAD candidate and then presented at [facility] transplant listing conference and they felt he was not a candidate for transplant, but good candidate for LVAD under DT criteria.   POD 74 HeartMate II LVAD  He did well overnight with a couple of PI events and lasix drip stopped. I saw him this am and decided to stop the NO and dopamine. He remains on amio at 30.  He sat up briefly this am and dropped his PI, felt dizzy, MAP dropped into the 60's. Started on Midodrine.  LVAD INTERROGATION:  HeartMate II LVAD:  Flow 3.6 liters/min, speed 8800, power 4.3, PI 4-5.  Had a couple PI events last night.  Objective:    Vital Signs:   Temp:  [97.2 F (36.2 C)-98.8 F (37.1 C)] 97.5 F (36.4 C) (12/19 1159) Pulse Rate:  [32-134] 59 (12/19 1300) Resp:  [9-25] 12 (12/19 1400) BP: (93)/(74) 93/74 mmHg (12/19 0802) SpO2:  [78 %-100 %] 96 % (12/19 1300) Arterial Line BP: (71-100)/(63-83) 85/71 mmHg (12/19 1000) Last BM Date: 10/18/13 Mean arterial Pressure 80  Intake/Output:   Intake/Output Summary (Last 24 hours) at 10/23/13 1445 Last data filed at 10/23/13 1400  Gross per 24 hour  Intake 2038.8 ml  Output   3570 ml  Net -1531.2 ml     Physical Exam:  General: Awake and  alert  HEENT: normal  Neck: supple. JVP . Carotids 2+ bilat; no bruits. No lymphadenopathy or thryomegaly appreciated.  Cor: No native valve sounds, LVAD hum present.  Lungs: clear  Abdomen: soft, nontender, nondistended. No hepatosplenomegaly. No bruits or masses. Active sounds.  Driveline: C/D/I; securement device intact and driveline incorporated  Extremities: moderate anasarca  Neuro: alert & orientedx3, cranial nerves grossly intact. moves all 4 extremities w/o difficulty. Affect pleasant     Telemetry: irregular rhythm. Looks like A-fib  Labs: Basic Metabolic Panel:  Recent Labs Lab 10/19/13 2242 10/20/13 0400  10/20/13 1643 10/21/13 0405 10/21/13 1145 10/21/13 1329 10/22/13 0445 10/22/13 1600 10/22/13 2200 10/23/13 0410  NA 140 139  < >  --  138 136  --  136 133* 136 136  K 4.5 5.2*  < >  --  4.7 4.5  --  3.5 3.6 4.0 3.5  CL 105 106  < >  --  105 102  --  100 96 99 99  CO2  --  26  --   --  25 25  --  29 27 30 30   GLUCOSE 152* 129*  < >  --  140* 172*  --  124* 133* 116* 113*  BUN 18 19  < >  --  22 23  --  22 21 20 18   CREATININE 1.40* 1.39*  < > 1.43* 1.33  1.28  --  1.24 1.23 1.18 1.15  CALCIUM  --  8.2*  --   --  8.3* 8.1*  --  8.0* 8.1* 7.8* 7.9*  MG  --  2.4  --  2.2 2.2  --  2.0 2.0  --   --  1.8  PHOS  --  3.1  --   --  4.3  --   --  3.3  --   --  2.0*  < > = values in this interval not displayed.  Liver Function Tests:  Recent Labs Lab 10/20/13 0400 10/21/13 1145 10/22/13 0445 10/23/13 0410  AST 233* 149* 82* 36  ALT 49 82* 62* 40  ALKPHOS  --  79 77 83  BILITOT 2.5* 0.6 0.6 0.6  PROT  --  5.1* 5.4* 5.3*  ALBUMIN  --  2.7* 3.0* 2.9*   No results found for this basename: LIPASE, AMYLASE,  in the last 168 hours No results found for this basename: AMMONIA,  in the last 168 hours  CBC:  Recent Labs Lab 10/20/13 0400  10/21/13 0405 10/21/13 1130 10/21/13 1730 10/22/13 0445 10/23/13 0410  WBC 12.1*  < > 13.1* 13.2* 12.1* 9.9 8.3   NEUTROABS 8.8*  --  9.9*  --   --  8.0* 6.4  HGB 8.0*  < > 8.4* 8.2* 8.4* 8.4* 8.4*  HCT 23.3*  < > 24.1* 24.0* 24.6* 24.6* 24.9*  MCV 90.3  < > 88.9 89.6 90.1 89.8 90.9  PLT 126*  < > 109* 89* 82* 72* 77*  < > = values in this interval not displayed.  INR:  Recent Labs Lab 10/20/13 0400 10/20/13 1502 10/21/13 0405 10/22/13 0445 10/23/13 0410  INR 1.49 1.83* 1.47 1.14 1.26   LDH 659 < 726>603< 493  Other results:  EKG:   Imaging: Dg Chest Port 1 View  10/23/2013   CLINICAL DATA:  Status post left ventricular assist device placement  EXAM: PORTABLE CHEST - 1 VIEW  COMPARISON:  Chest x-ray of October 22, 2013  FINDINGS: The lungs are well-expanded. The interstitial markings are increased diffusely and are slightly more conspicuous than on yesterday's study. Bilateral chest tubes are unchanged in position. There is no evidence of a pneumothorax. The retrocardiac region on the left remains dense and the left hemidiaphragm is partially obscured today but patient positioning limits evaluation of the lateral aspect of the left hemidiaphragm. The cardiopericardial silhouette remains enlarged. A permanent pacemaker defibrillator is in place. A PICC line is present with the tip in the region of the proximal SVC. A right internal jugular venous catheter and a Cordis sheath are in place. A mediastinal drain is present. Seven intact sternal wires are visible. An inferior left chest tube previously demonstrated is not clearly included in the field of view today.  IMPRESSION: 1. There has been interval deterioration in the appearance of the pulmonary interstitium suggesting increasing pulmonary interstitial edema. 2. The support tubes and lines are in reasonable position as described. 3. The cardiopericardial silhouette remains enlarged. The left hemidiaphragm is less well demonstrated today.   Electronically Signed   By: David  [place]   On: 10/23/2013 07:52   Dg Chest Port 1 View  10/22/2013    CLINICAL DATA:  Status post ventricular assist device placement and chest tubes.  EXAM: PORTABLE CHEST - 1 VIEW  COMPARISON:  One-view chest 10/21/2013.  FINDINGS: A right IJ line, right IJ sheath, and right subclavian PICC line are stable. Bilateral chest tubes and the  mediastinal drain are in place. There is no pneumothorax. Aeration is improving at both lung bases. Minimal atelectasis persists, left greater than right. The ventricular assist device is stable in position.  IMPRESSION: 1. Improving aeration at the lung bases bilaterally with persistent atelectasis, left greater than right. 2. The support apparatus is otherwise stable. 3. Stable positioning of the ventricular assist device.   Electronically Signed   By: Gennette Pac M.D.   On: 10/22/2013 08:02   Dg Chest Port 1 View  10/21/2013   CLINICAL DATA:  PICC line placement  EXAM: PORTABLE CHEST - 1 VIEW  COMPARISON:  Portable exam 1837 hr compared to 0 0633 hr  FINDINGS: Right arm PICC line, tip projecting over SVC above cavoatrial junction.  Left thoracostomy tube and mediastinal drainage tube unchanged.  Right jugular central venous catheter x2, tip projecting over SVC.  Left subclavian AICD leads project over right atrium right ventricle.  Left ventricular assist device identified at inferior margin of exam.  Enlargement of cardiac silhouette post median sternotomy.  Pulmonary vascular congestion and diffuse interstitial infiltrates likely pulmonary edema, increased.  No significant pleural effusion or pneumothorax.  IMPRESSION: Tip of right arm PICC line projects over SVC.  Remaining support devices unchanged.  Increased pulmonary edema.   Electronically Signed   By: Ulyses Southward M.D.   On: 10/21/2013 18:56      Medications:     Scheduled Medications: . acetaminophen  1,000 mg Oral Q6H   Or  . acetaminophen (TYLENOL) oral liquid 160 mg/5 mL  1,000 mg Per Tube Q6H  . aspirin EC  325 mg Oral Daily   Or  . aspirin  324 mg Per Tube Daily    Or  . aspirin  300 mg Rectal Daily  . bisacodyl  10 mg Oral Daily   Or  . bisacodyl  10 mg Rectal Daily  . docusate sodium  200 mg Oral Daily  . feeding supplement (ENSURE COMPLETE)  237 mL Oral BID BM  . furosemide  20 mg Intravenous BID  . insulin aspart  0-24 Units Subcutaneous Q4H  . metoCLOPramide (REGLAN) injection  10 mg Intravenous Q6H  . midodrine  2.5 mg Oral TID WC  . pantoprazole  40 mg Oral Daily  . sodium chloride  10 mL Intravenous Q12H  . sodium chloride  10-40 mL Intracatheter Q12H  . sodium chloride  3 mL Intravenous Q12H  . warfarin  5 mg Oral ONCE-1800  . Warfarin - Physician Dosing Inpatient   Does not apply q1800     Infusions: . sodium chloride 20 mL/hr at 10/22/13 0800  . sodium chloride 12 mL/hr at 10/22/13 1359  . sodium chloride 250 mL (10/20/13 0600)  . amiodarone (NEXTERONE PREMIX) 360 mg/200 mL dextrose 30 mg/hr (10/23/13 1245)  . DOPamine 1 mcg/kg/min (10/23/13 0700)  . furosemide (LASIX) infusion Stopped (10/23/13 0600)  . lactated ringers 20 mL/hr at 10/22/13 0800  . nitroGLYCERIN Stopped (10/19/13 1700)  . phenylephrine (NEO-SYNEPHRINE) Adult infusion Stopped (10/19/13 1700)     PRN Medications:  morphine injection, ondansetron (ZOFRAN) IV, oxyCODONE, sodium chloride, sodium chloride, sodium chloride   Assessment:   1) Chronic systolic HF, Class IV, EF 20-25%  --s/p HM II VAD implant 12/15  2) NICM/ICM  3) Recurrent Vtach  - ablation 09/2011 and 06/2013  4) Atrial flutter  - s/p DC-CV 10/15/2013  5) CAD with recanalized occluded RCA with left to right collat.  6) PAD: s/p aorto-bifem, bilateral renal artery bypass, bilateral  fem-pop, and left CEA.      Plan/Discussion:    His hemodynamics are fairly stable but he has some difficulty getting up to chair with drop in PI, MAP, tachycardia. These could be suction events. He has a relatively small LV cavity. He may be better off with a slighly slower speed to allow the LV to remain  more full.   Continue gentle diuresis.  Coumadin 5 today.  Encourage nutrition  I reviewed the LVAD parameters from today, and compared the results to the patient's prior recorded data.  No programming changes were made.  The LVAD is functioning within specified parameters.  The patient performs LVAD self-test daily.  LVAD interrogation was negative for any significant power changes, alarms or PI events/speed drops.  LVAD equipment check completed and is in good working order.  Back-up equipment present.   LVAD education done on emergency procedures and precautions and reviewed exit site care.  Length of Stay: 5  BARTLE,BRYAN K 10/23/2013, 2:45 PM

## 2013-10-24 ENCOUNTER — Inpatient Hospital Stay (HOSPITAL_COMMUNITY): Payer: Medicare Other

## 2013-10-24 LAB — PROTIME-INR
INR: 2.57 — ABNORMAL HIGH (ref 0.00–1.49)
Prothrombin Time: 26.7 seconds — ABNORMAL HIGH (ref 11.6–15.2)

## 2013-10-24 LAB — CBC WITH DIFFERENTIAL/PLATELET
Basophils Relative: 0 % (ref 0–1)
Eosinophils Absolute: 0.2 10*3/uL (ref 0.0–0.7)
Eosinophils Relative: 3 % (ref 0–5)
HCT: 28.3 % — ABNORMAL LOW (ref 39.0–52.0)
Hemoglobin: 9.4 g/dL — ABNORMAL LOW (ref 13.0–17.0)
Lymphs Abs: 0.7 10*3/uL (ref 0.7–4.0)
MCH: 30.4 pg (ref 26.0–34.0)
MCHC: 33.2 g/dL (ref 30.0–36.0)
MCV: 91.6 fL (ref 78.0–100.0)
Monocytes Relative: 15 % — ABNORMAL HIGH (ref 3–12)
Neutrophils Relative %: 74 % (ref 43–77)
RBC: 3.09 MIL/uL — ABNORMAL LOW (ref 4.22–5.81)

## 2013-10-24 LAB — GLUCOSE, CAPILLARY
Glucose-Capillary: 102 mg/dL — ABNORMAL HIGH (ref 70–99)
Glucose-Capillary: 109 mg/dL — ABNORMAL HIGH (ref 70–99)
Glucose-Capillary: 88 mg/dL (ref 70–99)
Glucose-Capillary: 100 mg/dL — ABNORMAL HIGH (ref 70–99)

## 2013-10-24 LAB — COMPREHENSIVE METABOLIC PANEL
ALT: 30 U/L (ref 0–53)
AST: 27 U/L (ref 0–37)
Albumin: 2.7 g/dL — ABNORMAL LOW (ref 3.5–5.2)
Alkaline Phosphatase: 81 U/L (ref 39–117)
BUN: 20 mg/dL (ref 6–23)
CO2: 29 mEq/L (ref 19–32)
Calcium: 8.2 mg/dL — ABNORMAL LOW (ref 8.4–10.5)
Chloride: 97 mEq/L (ref 96–112)
Creatinine, Ser: 1.12 mg/dL (ref 0.50–1.35)
GFR calc Af Amer: 73 mL/min — ABNORMAL LOW (ref >90)
GFR calc non Af Amer: 63 mL/min — ABNORMAL LOW (ref >90)
Glucose, Bld: 113 mg/dL — ABNORMAL HIGH (ref 70–99)
Potassium: 3.4 mEq/L — ABNORMAL LOW (ref 3.5–5.1)
Sodium: 135 mEq/L (ref 135–145)
Total Bilirubin: 0.7 mg/dL (ref 0.3–1.2)
Total Protein: 5.3 g/dL — ABNORMAL LOW (ref 6.0–8.3)

## 2013-10-24 LAB — CARBOXYHEMOGLOBIN
Carboxyhemoglobin: 2 % — ABNORMAL HIGH (ref 0.5–1.5)
Methemoglobin: 1.6 % — ABNORMAL HIGH (ref 0.0–1.5)
O2 Saturation: 62.9 %
Total hemoglobin: 9.5 g/dL — ABNORMAL LOW (ref 13.5–18.0)

## 2013-10-24 LAB — PHOSPHORUS: Phosphorus: 2.5 mg/dL (ref 2.3–4.6)

## 2013-10-24 LAB — MAGNESIUM: Magnesium: 2 mg/dL (ref 1.5–2.5)

## 2013-10-24 LAB — PREPARE RBC (CROSSMATCH)

## 2013-10-24 MED ORDER — AMIODARONE HCL 200 MG PO TABS
400.0000 mg | ORAL_TABLET | Freq: Two times a day (BID) | ORAL | Status: DC
  Administered 2013-10-24 – 2013-10-30 (×13): 400 mg via ORAL
  Filled 2013-10-24 (×14): qty 2

## 2013-10-24 MED ORDER — FERROUS GLUCONATE 324 (38 FE) MG PO TABS
324.0000 mg | ORAL_TABLET | Freq: Two times a day (BID) | ORAL | Status: DC
  Administered 2013-10-24 – 2013-11-12 (×38): 324 mg via ORAL
  Filled 2013-10-24 (×42): qty 1

## 2013-10-24 MED ORDER — METOCLOPRAMIDE HCL 10 MG PO TABS
10.0000 mg | ORAL_TABLET | Freq: Three times a day (TID) | ORAL | Status: DC
  Administered 2013-10-24 – 2013-10-26 (×6): 10 mg via ORAL
  Filled 2013-10-24 (×9): qty 1

## 2013-10-24 MED ORDER — POTASSIUM CHLORIDE 10 MEQ/50ML IV SOLN
10.0000 meq | INTRAVENOUS | Status: AC
  Administered 2013-10-24 (×6): 10 meq via INTRAVENOUS

## 2013-10-24 MED ORDER — POTASSIUM CHLORIDE 10 MEQ/50ML IV SOLN
INTRAVENOUS | Status: AC
  Filled 2013-10-24: qty 300

## 2013-10-24 NOTE — Progress Notes (Signed)
Patient ID: [name], male   DOB: 1941/08/18, 72 y.o.   MRN: [id number]  SICU Evening Rounds:  Hemodynamics stable. CVP 4 He has been up in a chair a good part of the day.  Had a BM. Brief episode of NSVT when having a BM.  Diuresed with lasix this am then 20 cc per hr afterwards.  Continue present course.

## 2013-10-24 NOTE — Progress Notes (Addendum)
Notified Dr. Laneta Simmers of PI sustaining in 3s and drops as low as 2.8. CVP 5. MAP 74. Patient asymptomatic currently. Order received for 1 unit PRBC. Thresa Ross RN

## 2013-10-24 NOTE — Progress Notes (Signed)
Patient ID: KEYVON HERTER, male   DOB: [birth year], 72 y.o.   MRN: [id number]  HeartMate 2 Rounding Note  Subjective:    Leldon Steege is a 72 yo man with an ischemic CM s/p ICD implant, chronic systolic HF (EF 20-25%), paroxysmal VT s/p ablation (09/2011) and repeat (06/2013), CAD, PVD (s/p R fem pop BPG 01/2010; s/p L CFA BPG 05/2002), AAA (s/p repair 04/2002), CAS (s/p L CEA 2008), HTN, arthritis, and TIA. Recently admitted 12/8-12/11/14 for syncopal episode from inappropriate ATP which accelerated SVT>>VT with failed ATP and then spontaneous termination of VT. During hospital stay had repeat RHC, hemodynamics were stable and had TEE-DC-CV of AFL 10/15/13 which was successful. He was presented to our [facility] ([place] team and felt to be good LVAD candidate and then presented at [facility] transplant listing conference and they felt he was not a candidate for transplant, but good candidate for LVAD under DT criteria.    POD 5 HeartMate II LVAD  Started on midodrine yesterday for orthostasis and suction events while standing. Had PI drop overnight and received 1 unit RBC.  Continues to diurese slowly. Feels better though stil fatigued. Belly waking up. Tolerating clears.   Improved this am.   Co-ox is up to 62.9. CVP not done this am.  LVAD INTERROGATION:  HeartMate II LVAD:  Flow 3.6 liters/min, speed 8800, power 4.3, PI 6.8. Two PI events  Objective:    Vital Signs:   Temp:  [97.5 F (36.4 C)-98.5 F (36.9 C)] 98 F (36.7 C) (12/20 0753) Pulse Rate:  [32-153] 91 (12/20 0600) Resp:  [9-22] 12 (12/20 0800) SpO2:  [84 %-100 %] 97 % (12/20 0600) Arterial Line BP: (85)/(71) 85/71 mmHg (12/19 1000) Weight:  [88.9 kg (195 lb 15.8 oz)] 88.9 kg (195 lb 15.8 oz) (12/20 0500) Last BM Date: 10/18/13 Mean arterial Pressure 86  Intake/Output:   Intake/Output Summary (Last 24 hours) at 10/24/13 0918 Last data filed at 10/24/13 0800  Gross per 24 hour  Intake 1426.6 ml  Output   2160 ml  Net -733.4 ml      Physical Exam: General: Awake and alert. Sitting in chair He looks much brighter this am and is smiling some. HEENT: normal  Neck: supple. JVP 8-9 . Carotids 2+ bilat; no bruits. No lymphadenopathy or thryomegaly appreciated.  Cor: No native valve sounds, LVAD hum present.  Lungs: clear  Abdomen: soft, nontender, nondistended. No hepatosplenomegaly. No bruits or masses. Active bowel sounds.  Driveline: C/D/I; securement device intact and driveline incorporated  Extremities: Tr-1+ edema warm Neuro: alert & orientedx3, cranial nerves grossly intact. moves all 4 extremities w/o difficulty. Affect pleasant   Telemetry: A-fib 90s  Labs: Basic Metabolic Panel:  Recent Labs Lab 10/20/13 0400  10/21/13 0405  10/21/13 1329 10/22/13 0445 10/22/13 1600 10/22/13 2200 10/23/13 0410 10/24/13 0500  NA 139  < > 138  < >  --  136 133* 136 136 135  K 5.2*  < > 4.7  < >  --  3.5 3.6 4.0 3.5 3.4*  CL 106  < > 105  < >  --  100 96 99 99 97  CO2 26  --  25  < >  --  29 27 30 30 29   GLUCOSE 129*  < > 140*  < >  --  124* 133* 116* 113* 113*  BUN 19  < > 22  < >  --  22 21 20 18 20   CREATININE 1.39*  < > 1.33  < >  --  1.24 1.23 1.18 1.15 1.12  CALCIUM 8.2*  --  8.3*  < >  --  8.0* 8.1* 7.8* 7.9* 8.2*  MG 2.4  < > 2.2  --  2.0 2.0  --   --  1.8 2.0  PHOS 3.1  --  4.3  --   --  3.3  --   --  2.0* 2.5  < > = values in this interval not displayed.  Liver Function Tests:  Recent Labs Lab 10/20/13 0400 10/21/13 1145 10/22/13 0445 10/23/13 0410 10/24/13 0500  AST 233* 149* 82* 36 27  ALT 49 82* 62* 40 30  ALKPHOS  --  79 77 83 81  BILITOT 2.5* 0.6 0.6 0.6 0.7  PROT  --  5.1* 5.4* 5.3* 5.3*  ALBUMIN  --  2.7* 3.0* 2.9* 2.7*   No results found for this basename: LIPASE, AMYLASE,  in the last 168 hours No results found for this basename: AMMONIA,  in the last 168 hours  CBC:  Recent Labs Lab 10/20/13 0400  10/21/13 0405 10/21/13 1130 10/21/13 1730 10/22/13 0445 10/23/13 0410  10/24/13 0500  WBC 12.1*  < > 13.1* 13.2* 12.1* 9.9 8.3 8.4  NEUTROABS 8.8*  --  9.9*  --   --  8.0* 6.4 6.2  HGB 8.0*  < > 8.4* 8.2* 8.4* 8.4* 8.4* 9.4*  HCT 23.3*  < > 24.1* 24.0* 24.6* 24.6* 24.9* 28.3*  MCV 90.3  < > 88.9 89.6 90.1 89.8 90.9 91.6  PLT 126*  < > 109* 89* 82* 72* 77* 98*  < > = values in this interval not displayed.  INR:  Recent Labs Lab 10/20/13 1502 10/21/13 0405 10/22/13 0445 10/23/13 0410 10/24/13 0500  INR 1.83* 1.47 1.14 1.26 2.57*   LDH 659 < 726>603< 493<458  Other results:  EKG:   Imaging: Dg Chest Port 1 View  10/24/2013   CLINICAL DATA:  Left ventricular assist device.  Chest tubes.  EXAM: PORTABLE CHEST - 1 VIEW  COMPARISON:  10/23/2013.  FINDINGS: The left ventricular assist device, bilateral chest tubes and AICD remain in place, unchanged. Continued interstitial opacities, likely edema. Stable cardiomegaly. Possible small left effusion with left base atelectasis.  IMPRESSION: No significant change since prior study.   Electronically Signed   By: Charlett Nose M.D.   On: 10/24/2013 07:29   Dg Chest Port 1 View  10/23/2013   CLINICAL DATA:  Status post left ventricular assist device placement  EXAM: PORTABLE CHEST - 1 VIEW  COMPARISON:  Chest x-ray of October 22, 2013  FINDINGS: The lungs are well-expanded. The interstitial markings are increased diffusely and are slightly more conspicuous than on yesterday's study. Bilateral chest tubes are unchanged in position. There is no evidence of a pneumothorax. The retrocardiac region on the left remains dense and the left hemidiaphragm is partially obscured today but patient positioning limits evaluation of the lateral aspect of the left hemidiaphragm. The cardiopericardial silhouette remains enlarged. A permanent pacemaker defibrillator is in place. A PICC line is present with the tip in the region of the proximal SVC. A right internal jugular venous catheter and a Cordis sheath are in place. A mediastinal  drain is present. Seven intact sternal wires are visible. An inferior left chest tube previously demonstrated is not clearly included in the field of view today.  IMPRESSION: 1. There has been interval deterioration in the appearance of the pulmonary interstitium suggesting increasing pulmonary interstitial edema. 2. The support tubes and lines are in reasonable  position as described. 3. The cardiopericardial silhouette remains enlarged. The left hemidiaphragm is less well demonstrated today.   Electronically Signed   By: David  [place]   On: 10/23/2013 07:52     Medications:     Scheduled Medications: . acetaminophen  1,000 mg Oral Q6H   Or  . acetaminophen (TYLENOL) oral liquid 160 mg/5 mL  1,000 mg Per Tube Q6H  . amiodarone  400 mg Oral BID  . aspirin EC  325 mg Oral Daily   Or  . aspirin  324 mg Per Tube Daily   Or  . aspirin  300 mg Rectal Daily  . bisacodyl  10 mg Oral Daily   Or  . bisacodyl  10 mg Rectal Daily  . docusate sodium  200 mg Oral Daily  . feeding supplement (ENSURE COMPLETE)  237 mL Oral BID BM  . ferrous gluconate  324 mg Oral BID WC  . furosemide  20 mg Intravenous BID  . insulin aspart  0-24 Units Subcutaneous Q4H  . metoCLOPramide  10 mg Oral TID AC  . midodrine  2.5 mg Oral TID WC  . pantoprazole  40 mg Oral Daily  . potassium chloride  10 mEq Intravenous Q1 Hr x 6  . potassium chloride      . sodium chloride  10 mL Intravenous Q12H  . sodium chloride  10-40 mL Intracatheter Q12H  . sodium chloride  3 mL Intravenous Q12H  . Warfarin - Physician Dosing Inpatient   Does not apply q1800    Infusions: . sodium chloride 20 mL/hr at 10/22/13 0800  . sodium chloride 20 mL (10/23/13 2347)  . sodium chloride 250 mL (10/20/13 0600)  . amiodarone (NEXTERONE PREMIX) 360 mg/200 mL dextrose 30 mg/hr (10/23/13 2346)  . furosemide (LASIX) infusion Stopped (10/23/13 0600)  . lactated ringers 20 mL/hr at 10/22/13 0800  . nitroGLYCERIN Stopped (10/19/13 1700)  .  phenylephrine (NEO-SYNEPHRINE) Adult infusion Stopped (10/19/13 1700)    PRN Medications: morphine injection, ondansetron (ZOFRAN) IV, oxyCODONE, sodium chloride, sodium chloride, sodium chloride   Assessment:   ) Chronic systolic HF, Class IV, EF 20-25%  --s/p HM II VAD implant 12/15  2) NICM/ICM  3) Recurrent Vtach  - ablation 09/2011 and 06/2013  4) Atrial flutter  - s/p DC-CV 10/15/2013  5) CAD with recanalized occluded RCA with left to right collat.  6) PAD: s/p aorto-bifem, bilateral renal artery bypass, bilateral fem-pop, and left CEA.     Plan/Discussion:    Improved this am. Agree with changes per TCTS. Will continue midodrine for orthostasis to hopefully facilitate ambulation.  INR jumped up to 2.57. Holding coumadin. Will d/w with pharmacy team.   OK to switch amio to po if tolerating po.  Gentle with diuresis.    I reviewed the LVAD parameters from today, and compared the results to the patient's prior recorded data.  No programming changes were made.  The LVAD is functioning within specified parameters.  The patient performs LVAD self-test daily.  LVAD interrogation was negative for any significant power changes, alarms or PI events/speed drops.  LVAD equipment check completed and is in good working order.  Back-up equipment present.   LVAD education done on emergency procedures and precautions and reviewed exit site care.  Length of Stay: 6  Braydan Marriott BensimhonMD 10/24/2013, 9:18 AM

## 2013-10-24 NOTE — Progress Notes (Signed)
Patient ID: [name], male   DOB: November 19, 1940, 72 y.o.   MRN: [id number]  HeartMate 2 Rounding Note  Subjective:    [name] is a 72 yo man with an ischemic CM s/p ICD implant, chronic systolic HF (EF 20-25%), paroxysmal VT s/p ablation (09/2011) and repeat (06/2013), CAD, PVD (s/p R fem pop BPG 01/2010; s/p L CFA BPG 05/2002), AAA (s/p repair 04/2002), CAS (s/p L CEA 2008), HTN, arthritis, and TIA. Recently admitted 12/8-12/11/14 for syncopal episode from inappropriate ATP which accelerated SVT>>VT with failed ATP and then spontaneous termination of VT. During hospital stay had repeat RHC, hemodynamics were stable and had TEE-DC-CV of AFL 10/15/13 which was successful. He was presented to our [facility] team and felt to be good LVAD candidate and then presented at [facility] transplant listing conference and they felt he was not a candidate for transplant, but good candidate for LVAD under DT criteria.    POD 5 HeartMate II LVAD  He had a good night overall. Nurse called me and said PI dropped to 2 something and CVP was 5. I gave him a unit of PRBC's for volume since Hgb was still 8.4. This increased PI back to 7.   This am he feels better and is up in chair with no hemodynamic changes and no arrhythmias.   Co-ox is up to 62.9. CVP not done this am.  He diuresed yesterday -1L. Weight is down 10 lbs from yesterday if accurate. Still 7 lbs over baseline preop.  He is passing flatus. No BM yet.  LVAD INTERROGATION:  HeartMate II LVAD:  Flow 3.6 liters/min, speed 8800, power 4.3, PI 7.0.  Last PI event was last night 10 pm. Objective:    Vital Signs:   Temp:  [97.5 F (36.4 C)-98.5 F (36.9 C)] 98 F (36.7 C) (12/20 0753) Pulse Rate:  [32-153] 91 (12/20 0600) Resp:  [9-22] 12 (12/20 0800) SpO2:  [84 %-100 %] 97 % (12/20 0600) Arterial Line BP: (85-97)/(71-76) 85/71 mmHg (12/19 1000) Weight:  [88.9 kg (195 lb 15.8 oz)] 88.9 kg (195 lb 15.8 oz) (12/20 0500) Last BM Date: 10/18/13 Mean  arterial Pressure 86  Intake/Output:   Intake/Output Summary (Last 24 hours) at 10/24/13 0836 Last data filed at 10/24/13 0800  Gross per 24 hour  Intake 1444.9 ml  Output   2200 ml  Net -755.1 ml     Physical Exam: General: Awake and alert. He looks much brighter this am and is smiling some. HEENT: normal  Neck: supple. JVP . Carotids 2+ bilat; no bruits. No lymphadenopathy or thryomegaly appreciated.  Cor: No native valve sounds, LVAD hum present.  Lungs: clear  Abdomen: soft, nontender, nondistended. No hepatosplenomegaly. No bruits or masses. Active bowel sounds.  Driveline: C/D/I; securement device intact and driveline incorporated  Extremities: peripheral edema is decreasing Neuro: alert & orientedx3, cranial nerves grossly intact. moves all 4 extremities w/o difficulty. Affect pleasant      Telemetry: A-fib 90  Labs: Basic Metabolic Panel:  Recent Labs Lab 10/20/13 0400  10/21/13 0405  10/21/13 1329 10/22/13 0445 10/22/13 1600 10/22/13 2200 10/23/13 0410 10/24/13 0500  NA 139  < > 138  < >  --  136 133* 136 136 135  K 5.2*  < > 4.7  < >  --  3.5 3.6 4.0 3.5 3.4*  CL 106  < > 105  < >  --  100 96 99 99 97  CO2 26  --  25  < >  --  29 27 30 30 29   GLUCOSE 129*  < > 140*  < >  --  124* 133* 116* 113* 113*  BUN 19  < > 22  < >  --  22 21 20 18 20   CREATININE 1.39*  < > 1.33  < >  --  1.24 1.23 1.18 1.15 1.12  CALCIUM 8.2*  --  8.3*  < >  --  8.0* 8.1* 7.8* 7.9* 8.2*  MG 2.4  < > 2.2  --  2.0 2.0  --   --  1.8 2.0  PHOS 3.1  --  4.3  --   --  3.3  --   --  2.0* 2.5  < > = values in this interval not displayed.  Liver Function Tests:  Recent Labs Lab 10/20/13 0400 10/21/13 1145 10/22/13 0445 10/23/13 0410 10/24/13 0500  AST 233* 149* 82* 36 27  ALT 49 82* 62* 40 30  ALKPHOS  --  79 77 83 81  BILITOT 2.5* 0.6 0.6 0.6 0.7  PROT  --  5.1* 5.4* 5.3* 5.3*  ALBUMIN  --  2.7* 3.0* 2.9* 2.7*   No results found for this basename: LIPASE, AMYLASE,  in the  last 168 hours No results found for this basename: AMMONIA,  in the last 168 hours  CBC:  Recent Labs Lab 10/20/13 0400  10/21/13 0405 10/21/13 1130 10/21/13 1730 10/22/13 0445 10/23/13 0410 10/24/13 0500  WBC 12.1*  < > 13.1* 13.2* 12.1* 9.9 8.3 8.4  NEUTROABS 8.8*  --  9.9*  --   --  8.0* 6.4 6.2  HGB 8.0*  < > 8.4* 8.2* 8.4* 8.4* 8.4* 9.4*  HCT 23.3*  < > 24.1* 24.0* 24.6* 24.6* 24.9* 28.3*  MCV 90.3  < > 88.9 89.6 90.1 89.8 90.9 91.6  PLT 126*  < > 109* 89* 82* 72* 77* 98*  < > = values in this interval not displayed.  INR:  Recent Labs Lab 10/20/13 1502 10/21/13 0405 10/22/13 0445 10/23/13 0410 10/24/13 0500  INR 1.83* 1.47 1.14 1.26 2.57*   LDH 659 < 726>603< 493<458  Other results:  EKG:   Imaging: Dg Chest Port 1 View  10/24/2013   CLINICAL DATA:  Left ventricular assist device.  Chest tubes.  EXAM: PORTABLE CHEST - 1 VIEW  COMPARISON:  10/23/2013.  FINDINGS: The left ventricular assist device, bilateral chest tubes and AICD remain in place, unchanged. Continued interstitial opacities, likely edema. Stable cardiomegaly. Possible small left effusion with left base atelectasis.  IMPRESSION: No significant change since prior study.   Electronically Signed   By: Charlett Nose M.D.   On: 10/24/2013 07:29   Dg Chest Port 1 View  10/23/2013   CLINICAL DATA:  Status post left ventricular assist device placement  EXAM: PORTABLE CHEST - 1 VIEW  COMPARISON:  Chest x-ray of October 22, 2013  FINDINGS: The lungs are well-expanded. The interstitial markings are increased diffusely and are slightly more conspicuous than on yesterday's study. Bilateral chest tubes are unchanged in position. There is no evidence of a pneumothorax. The retrocardiac region on the left remains dense and the left hemidiaphragm is partially obscured today but patient positioning limits evaluation of the lateral aspect of the left hemidiaphragm. The cardiopericardial silhouette remains enlarged. A  permanent pacemaker defibrillator is in place. A PICC line is present with the tip in the region of the proximal SVC. A right internal jugular venous catheter and a Cordis sheath are in place. A mediastinal drain  is present. Seven intact sternal wires are visible. An inferior left chest tube previously demonstrated is not clearly included in the field of view today.  IMPRESSION: 1. There has been interval deterioration in the appearance of the pulmonary interstitium suggesting increasing pulmonary interstitial edema. 2. The support tubes and lines are in reasonable position as described. 3. The cardiopericardial silhouette remains enlarged. The left hemidiaphragm is less well demonstrated today.   Electronically Signed   By: David  [place]   On: 10/23/2013 07:52      Medications:     Scheduled Medications: . acetaminophen  1,000 mg Oral Q6H   Or  . acetaminophen (TYLENOL) oral liquid 160 mg/5 mL  1,000 mg Per Tube Q6H  . amiodarone  400 mg Oral BID  . aspirin EC  325 mg Oral Daily   Or  . aspirin  324 mg Per Tube Daily   Or  . aspirin  300 mg Rectal Daily  . bisacodyl  10 mg Oral Daily   Or  . bisacodyl  10 mg Rectal Daily  . docusate sodium  200 mg Oral Daily  . feeding supplement (ENSURE COMPLETE)  237 mL Oral BID BM  . furosemide  20 mg Intravenous BID  . insulin aspart  0-24 Units Subcutaneous Q4H  . metoCLOPramide  10 mg Oral TID AC  . midodrine  2.5 mg Oral TID WC  . pantoprazole  40 mg Oral Daily  . potassium chloride  10 mEq Intravenous Q1 Hr x 6  . sodium chloride  10 mL Intravenous Q12H  . sodium chloride  10-40 mL Intracatheter Q12H  . sodium chloride  3 mL Intravenous Q12H  . Warfarin - Physician Dosing Inpatient   Does not apply q1800     Infusions: . sodium chloride 20 mL/hr at 10/22/13 0800  . sodium chloride 20 mL (10/23/13 2347)  . sodium chloride 250 mL (10/20/13 0600)  . amiodarone (NEXTERONE PREMIX) 360 mg/200 mL dextrose 30 mg/hr (10/23/13 2346)  .  furosemide (LASIX) infusion Stopped (10/23/13 0600)  . lactated ringers 20 mL/hr at 10/22/13 0800  . nitroGLYCERIN Stopped (10/19/13 1700)  . phenylephrine (NEO-SYNEPHRINE) Adult infusion Stopped (10/19/13 1700)     PRN Medications:  morphine injection, ondansetron (ZOFRAN) IV, oxyCODONE, sodium chloride, sodium chloride, sodium chloride   Assessment:   ) Chronic systolic HF, Class IV, EF 20-25%  --s/p HM II VAD implant 12/15  2) NICM/ICM  3) Recurrent Vtach  - ablation 09/2011 and 06/2013  4) Atrial flutter  - s/p DC-CV 10/15/2013  5) CAD with recanalized occluded RCA with left to right collat.  6) PAD: s/p aorto-bifem, bilateral renal artery bypass, bilateral fem-pop, and left CEA.     Plan/Discussion:    He is hemodynamically stable and up to chair today with no hemodynamic changes or dizziness. He is on no pressors. Midodrine started yesterday.  Continue gentle diuresis to keep negative. Repleat K+.  Acute blood loss anemia: Improved after transfusion overnight. Hemodynamics appear more stable. Will start Fe2+ orally.  INR jumped up to 2.57. Will hold off on coumadin today because it may go higher since it was a big jump and he is on amio.  Chest tube output still significant. I think it is mostly from the pleural tubes because the pocket drain looks clotted at the exit site. This drainage should decrease as the volume excess improves. Will take tubes off suction. Probably remove the pocket drain tomorrow.  Encourage nutrition  Mobilize as tolerated.  Hopefully he  has turned the corner.  I reviewed the LVAD parameters from today, and compared the results to the patient's prior recorded data.  No programming changes were made.  The LVAD is functioning within specified parameters.  The patient performs LVAD self-test daily.  LVAD interrogation was negative for any significant power changes, alarms or PI events/speed drops.  LVAD equipment check completed and is in good  working order.  Back-up equipment present.   LVAD education done on emergency procedures and precautions and reviewed exit site care.  Length of Stay: 6  BARTLE,BRYAN K 10/24/2013, 8:36 AM

## 2013-10-25 ENCOUNTER — Inpatient Hospital Stay (HOSPITAL_COMMUNITY): Payer: Medicare Other

## 2013-10-25 LAB — COMPREHENSIVE METABOLIC PANEL
ALT: 23 U/L (ref 0–53)
AST: 22 U/L (ref 0–37)
Albumin: 2.5 g/dL — ABNORMAL LOW (ref 3.5–5.2)
Alkaline Phosphatase: 90 U/L (ref 39–117)
BUN: 18 mg/dL (ref 6–23)
CO2: 29 mEq/L (ref 19–32)
Calcium: 8 mg/dL — ABNORMAL LOW (ref 8.4–10.5)
Chloride: 98 mEq/L (ref 96–112)
Creatinine, Ser: 1.09 mg/dL (ref 0.50–1.35)
GFR calc Af Amer: 75 mL/min — ABNORMAL LOW (ref >90)
GFR calc non Af Amer: 65 mL/min — ABNORMAL LOW (ref >90)
Glucose, Bld: 112 mg/dL — ABNORMAL HIGH (ref 70–99)
Potassium: 3.9 mEq/L (ref 3.5–5.1)
Sodium: 135 mEq/L (ref 135–145)
Total Bilirubin: 0.8 mg/dL (ref 0.3–1.2)
Total Protein: 5.3 g/dL — ABNORMAL LOW (ref 6.0–8.3)

## 2013-10-25 LAB — CBC WITH DIFFERENTIAL/PLATELET
Basophils Relative: 0 % (ref 0–1)
Eosinophils Absolute: 0.2 10*3/uL (ref 0.0–0.7)
HCT: 27.8 % — ABNORMAL LOW (ref 39.0–52.0)
Hemoglobin: 9.4 g/dL — ABNORMAL LOW (ref 13.0–17.0)
MCH: 31 pg (ref 26.0–34.0)
MCHC: 33.8 g/dL (ref 30.0–36.0)
Monocytes Absolute: 1.2 10*3/uL — ABNORMAL HIGH (ref 0.1–1.0)
Monocytes Relative: 13 % — ABNORMAL HIGH (ref 3–12)
Neutrophils Relative %: 78 % — ABNORMAL HIGH (ref 43–77)
RDW: 17.2 % — ABNORMAL HIGH (ref 11.5–15.5)
WBC: 9.8 10*3/uL (ref 4.0–10.5)

## 2013-10-25 LAB — GLUCOSE, CAPILLARY
Glucose-Capillary: 104 mg/dL — ABNORMAL HIGH (ref 70–99)
Glucose-Capillary: 109 mg/dL — ABNORMAL HIGH (ref 70–99)
Glucose-Capillary: 111 mg/dL — ABNORMAL HIGH (ref 70–99)
Glucose-Capillary: 129 mg/dL — ABNORMAL HIGH (ref 70–99)
Glucose-Capillary: 95 mg/dL (ref 70–99)

## 2013-10-25 LAB — POCT I-STAT, CHEM 8
BUN: 18 mg/dL (ref 6–23)
Chloride: 95 mEq/L — ABNORMAL LOW (ref 96–112)
Creatinine, Ser: 1.2 mg/dL (ref 0.50–1.35)
Glucose, Bld: 113 mg/dL — ABNORMAL HIGH (ref 70–99)
Hemoglobin: 9.5 g/dL — ABNORMAL LOW (ref 13.0–17.0)
Potassium: 3.7 mEq/L (ref 3.5–5.1)
Sodium: 135 mEq/L (ref 135–145)
TCO2: 29 mmol/L (ref 0–100)

## 2013-10-25 LAB — TYPE AND SCREEN
ABO/RH(D): O POS
Antibody Screen: NEGATIVE

## 2013-10-25 LAB — CARBOXYHEMOGLOBIN
Carboxyhemoglobin: 1.9 % — ABNORMAL HIGH (ref 0.5–1.5)
Methemoglobin: 0.9 % (ref 0.0–1.5)
O2 Saturation: 57.6 %
Total hemoglobin: 9.5 g/dL — ABNORMAL LOW (ref 13.5–18.0)

## 2013-10-25 MED ORDER — DEXTROSE 5 % IV SOLN
300.0000 mg | Freq: Once | INTRAVENOUS | Status: AC
  Administered 2013-10-26: 300 mg via INTRAVENOUS
  Filled 2013-10-25: qty 6

## 2013-10-25 MED ORDER — GUAIFENESIN ER 600 MG PO TB12
600.0000 mg | ORAL_TABLET | Freq: Two times a day (BID) | ORAL | Status: DC
  Administered 2013-10-25 – 2013-11-18 (×49): 600 mg via ORAL
  Filled 2013-10-25 (×52): qty 1

## 2013-10-25 NOTE — Progress Notes (Signed)
Patient ID: MANOLO BOSKET, male   DOB: 1941-04-23, 72 y.o.   MRN: [id number]  HeartMate 2 Rounding Note  Subjective:    Ford Peddie is a 72 yo man with an ischemic CM s/p ICD implant, chronic systolic HF (EF 20-25%), paroxysmal VT s/p ablation (09/2011) and repeat (06/2013), CAD, PVD (s/p R fem pop BPG 01/2010; s/p L CFA BPG 05/2002), AAA (s/p repair 04/2002), CAS (s/p L CEA 2008), HTN, arthritis, and TIA. Recently admitted 12/8-12/11/14 for syncopal episode from inappropriate ATP which accelerated SVT>>VT with failed ATP and then spontaneous termination of VT. During hospital stay had repeat RHC, hemodynamics were stable and had TEE-DC-CV of AFL 10/15/13 which was successful. He was presented to our [facility] team and felt to be good LVAD candidate and then presented at [facility] transplant listing conference and they felt he was not a candidate for transplant, but good candidate for LVAD under DT criteria.   POD 6 HeartMate II LVAD  He had a good day yesterday and a good night. Spent a lot of time up in a chair. Ate better and has an appetite this am.  Diuresed -790 yesterday but weight up 1 1/2 lbs.  Co-ox 57.6 this am.  His ICD has been alarming and [employer] rep called. He thinks its probably because he has not had it connected to his telemetry interrogation for 3 weeks.  Nurse reports drive line yesterday looks ok with slight redness around it, minimal bloody drainage, no purulence. LVAD INTERROGATION:  HeartMate II LVAD:  Flow 4.0 liters/min, speed 8800, power 4.5, PI 7.6.  One PI event last pm and 1 this am.  Objective:    Vital Signs:   Temp:  [97.5 F (36.4 C)-99.2 F (37.3 C)] 98.3 F (36.8 C) (12/21 0755) Pulse Rate:  [67-119] 94 (12/21 0600) Resp:  [11-20] 16 (12/21 0700) SpO2:  [90 %-100 %] 97 % (12/21 0600) Weight:  [89.6 kg (197 lb 8.5 oz)] 89.6 kg (197 lb 8.5 oz) (12/21 0615) Last BM Date: 10/18/13 Mean arterial Pressure 78 CVP 10  Intake/Output:   Intake/Output Summary  (Last 24 hours) at 10/25/13 0845 Last data filed at 10/25/13 0700  Gross per 24 hour  Intake  716.8 ml  Output   1481 ml  Net -764.2 ml    Chest tube output 290 for 24 hrs.   Physical Exam: General: Awake and alert. Conversant HEENT: normal  Neck: supple. JVP . Carotids 2+ bilat; no bruits. No lymphadenopathy or thryomegaly appreciated.  Cor: Faint heart sounds , LVAD hum present.  Lungs: clear  Abdomen: soft, nontender, nondistended. No hepatosplenomegaly. No bruits or masses. Active bowel sounds.  Driveline: C/D/I; securement device intact and driveline incorporated  Extremities: peripheral edema is decreasing  Neuro: alert & orientedx3, cranial nerves grossly intact. moves all 4 extremities w/o difficulty. Affect pleasant    Telemetry: A-fib 100  Labs: Basic Metabolic Panel:  Recent Labs Lab 10/21/13 0405  10/21/13 1329 10/22/13 0445 10/22/13 1600 10/22/13 2200 10/23/13 0410 10/24/13 0500 10/25/13 0442  NA 138  < >  --  136 133* 136 136 135 135  K 4.7  < >  --  3.5 3.6 4.0 3.5 3.4* 3.9  CL 105  < >  --  100 96 99 99 97 98  CO2 25  < >  --  29 27 30 30 29 29   GLUCOSE 140*  < >  --  124* 133* 116* 113* 113* 112*  BUN 22  < >  --  22 21 20 18 20 18   CREATININE 1.33  < >  --  1.24 1.23 1.18 1.15 1.12 1.09  CALCIUM 8.3*  < >  --  8.0* 8.1* 7.8* 7.9* 8.2* 8.0*  MG 2.2  --  2.0 2.0  --   --  1.8 2.0 1.9  PHOS 4.3  --   --  3.3  --   --  2.0* 2.5 2.3  < > = values in this interval not displayed.  Liver Function Tests:  Recent Labs Lab 10/21/13 1145 10/22/13 0445 10/23/13 0410 10/24/13 0500 10/25/13 0442  AST 149* 82* 36 27 22  ALT 82* 62* 40 30 23  ALKPHOS 79 77 83 81 90  BILITOT 0.6 0.6 0.6 0.7 0.8  PROT 5.1* 5.4* 5.3* 5.3* 5.3*  ALBUMIN 2.7* 3.0* 2.9* 2.7* 2.5*   No results found for this basename: LIPASE, AMYLASE,  in the last 168 hours No results found for this basename: AMMONIA,  in the last 168 hours  CBC:  Recent Labs Lab 10/21/13 0405   10/21/13 1730 10/22/13 0445 10/23/13 0410 10/24/13 0500 10/25/13 0442  WBC 13.1*  < > 12.1* 9.9 8.3 8.4 9.8  NEUTROABS 9.9*  --   --  8.0* 6.4 6.2 7.7  HGB 8.4*  < > 8.4* 8.4* 8.4* 9.4* 9.4*  HCT 24.1*  < > 24.6* 24.6* 24.9* 28.3* 27.8*  MCV 88.9  < > 90.1 89.8 90.9 91.6 91.7  PLT 109*  < > 82* 72* 77* 98* 124*  < > = values in this interval not displayed.  INR:  Recent Labs Lab 10/21/13 0405 10/22/13 0445 10/23/13 0410 10/24/13 0500 10/25/13 0442  INR 1.47 1.14 1.26 2.57* 3.11*   LDH 659 < 726>603>493>458>410  Other results:  EKG:   Imaging: Dg Chest Port 1 View  10/25/2013   CLINICAL DATA:  Chest tubes  EXAM: PORTABLE CHEST - 1 VIEW  COMPARISON:  10/24/2013  FINDINGS: Left ventricular assist device and bilateral chest tubes again noted. No visible pneumothorax. Cardiomegaly. Continued interstitial opacities, likely mild edema, slightly improved on the left. Left base atelectasis. No visible significant effusions.  IMPRESSION: Slight improvement in the interstitial edema, particularly in the left lung. Continued mild edema and left base atelectasis. No pneumothorax.   Electronically Signed   By: Charlett Nose M.D.   On: 10/25/2013 07:38   Dg Chest Port 1 View  10/24/2013   CLINICAL DATA:  Left ventricular assist device.  Chest tubes.  EXAM: PORTABLE CHEST - 1 VIEW  COMPARISON:  10/23/2013.  FINDINGS: The left ventricular assist device, bilateral chest tubes and AICD remain in place, unchanged. Continued interstitial opacities, likely edema. Stable cardiomegaly. Possible small left effusion with left base atelectasis.  IMPRESSION: No significant change since prior study.   Electronically Signed   By: Charlett Nose M.D.   On: 10/24/2013 07:29      Medications:     Scheduled Medications: . amiodarone  400 mg Oral BID  . aspirin EC  325 mg Oral Daily   Or  . aspirin  324 mg Per Tube Daily   Or  . aspirin  300 mg Rectal Daily  . bisacodyl  10 mg Oral Daily   Or  .  bisacodyl  10 mg Rectal Daily  . docusate sodium  200 mg Oral Daily  . feeding supplement (ENSURE COMPLETE)  237 mL Oral BID BM  . ferrous gluconate  324 mg Oral BID WC  . furosemide  20 mg Intravenous BID  .  insulin aspart  0-24 Units Subcutaneous Q4H  . metoCLOPramide  10 mg Oral TID AC  . midodrine  2.5 mg Oral TID WC  . pantoprazole  40 mg Oral Daily  . sodium chloride  10 mL Intravenous Q12H  . sodium chloride  10-40 mL Intracatheter Q12H  . sodium chloride  3 mL Intravenous Q12H  . Warfarin - Physician Dosing Inpatient   Does not apply q1800     Infusions: . sodium chloride 20 mL/hr at 10/22/13 0800  . sodium chloride 20 mL (10/23/13 2347)  . sodium chloride 250 mL (10/20/13 0600)  . amiodarone (NEXTERONE PREMIX) 360 mg/200 mL dextrose 30 mg/hr (10/24/13 1039)  . furosemide (LASIX) infusion Stopped (10/23/13 0600)  . lactated ringers 20 mL/hr at 10/22/13 0800  . nitroGLYCERIN Stopped (10/19/13 1700)  . phenylephrine (NEO-SYNEPHRINE) Adult infusion Stopped (10/19/13 1700)     PRN Medications:  morphine injection, ondansetron (ZOFRAN) IV, oxyCODONE, sodium chloride, sodium chloride, sodium chloride   Assessment:   1) Chronic systolic HF, Class IV, EF 20-25%  --s/p HM II VAD implant 12/15  2) NICM/ICM  3) Recurrent Vtach  - ablation 09/2011 and 06/2013  4) Atrial flutter  - s/p DC-CV 10/15/2013  5) CAD with recanalized occluded RCA with left to right collat.  6) PAD: s/p aorto-bifem, bilateral renal artery bypass, bilateral fem-pop, and left CEA.      Plan/Discussion:    He remains hemodynamically stable and has been up to chair a lot with no hemodynamic changes or dizziness. Remains on Midodrine for orthostatic hypotension.  CVP is up to 10 from 4. Continue diuresis and repleat K+.  Acute blood loss anemia: stable Hgb  INR up to 3.11 with no coumadin yesterday, probably related to decreased nutritional state, amiodarone. Would hold coumadin today.  Chest  tube output is decreasing. The pocket drain looks clotted so will remove it. The pleural fluid is serosanguinous and thin.  He is still very weak and has not ambulated yet. Takes a lot of effort to get up to the chair. PT could not see him yesterday and said they will be back Monday.  I reviewed the LVAD parameters from today, and compared the results to the patient's prior recorded data.  No programming changes were made.  The LVAD is functioning within specified parameters.  The patient performs LVAD self-test daily.  LVAD interrogation was negative for any significant power changes, alarms or PI events/speed drops.  LVAD equipment check completed and is in good working order.  Back-up equipment present.   LVAD education done on emergency procedures and precautions and reviewed exit site care.  Length of Stay: 7  BARTLE,BRYAN K 10/25/2013, 8:45 AM

## 2013-10-25 NOTE — Progress Notes (Signed)
Patient ID: [name], male   DOB: November 22, 1940, 72 y.o.   MRN: [id number]  HeartMate 2 Rounding Note  Subjective:    [name] is a 72 yo man with an ischemic CM s/p ICD implant, chronic systolic HF (EF 20-25%), paroxysmal VT s/p ablation (09/2011) and repeat (06/2013), CAD, PVD (s/p R fem pop BPG 01/2010; s/p L CFA BPG 05/2002), AAA (s/p repair 04/2002), CAS (s/p L CEA 2008), HTN, arthritis, and TIA. Recently admitted 12/8-12/11/14 for syncopal episode from inappropriate ATP which accelerated SVT>>VT with failed ATP and then spontaneous termination of VT. During hospital stay had repeat RHC, hemodynamics were stable and had TEE-DC-CV of AFL 10/15/13 which was successful. He was presented to our [facility] team and felt to be good LVAD candidate and then presented at [facility] transplant listing conference and they felt he was not a candidate for transplant, but good candidate for LVAD under DT criteria.   POD 6 HeartMate II LVAD  Doing very well. Feeling stronger. No further orthostasis. Belly waking up with large BM yesterday.   MAPs stable. Co-ox 58% off all drips. Diuresing on lasix 20 bid. CVP ~5  Remains in AF at 100-120s. No VT on tele.  Coumadin on hold INR 3.1  Nurse reports drive line yesterday looks ok with slight redness around it, minimal bloody drainage, no purulence. LVAD INTERROGATION:  HeartMate II LVAD:  Flow 4.0 liters/min, speed 8800, power 4.5, PI 7.6. 3-4 PI events throughout the day  Objective:    Vital Signs:   Temp:  [98.3 F (36.8 C)-99.2 F (37.3 C)] 98.3 F (36.8 C) (12/21 1604) Pulse Rate:  [52-156] 74 (12/21 1900) Resp:  [10-23] 18 (12/21 1900) SpO2:  [90 %-100 %] 91 % (12/21 1900) Weight:  [89.6 kg (197 lb 8.5 oz)] 89.6 kg (197 lb 8.5 oz) (12/21 0615) Last BM Date: 10/18/13 Mean arterial Pressure 78 CVP 4-6  Intake/Output:   Intake/Output Summary (Last 24 hours) at 10/25/13 1950 Last data filed at 10/25/13 1900  Gross per 24 hour  Intake    720 ml   Output   1710 ml  Net   -990 ml    Chest tube output 290 for 24 hrs.   Physical Exam: General: Awake and alert. Conversant HEENT: normal  Neck: supple. JVP . Carotids 2+ bilat; no bruits. No lymphadenopathy or thryomegaly appreciated.  Cor: Faint heart sounds , LVAD hum present.  Lungs: clear  Abdomen: soft, nontender, nondistended. No hepatosplenomegaly. No bruits or masses. Active bowel sounds.  Driveline: C/D/I; securement device intact and driveline incorporated  Extremities: peripheral edema is decreasing  Neuro: alert & orientedx3, cranial nerves grossly intact. moves all 4 extremities w/o difficulty. Affect pleasant    Telemetry: A-fib 100-120  Labs: Basic Metabolic Panel:  Recent Labs Lab 10/21/13 0405  10/21/13 1329 10/22/13 0445 10/22/13 1600 10/22/13 2200 10/23/13 0410 10/24/13 0500 10/25/13 0442  NA 138  < >  --  136 133* 136 136 135 135  K 4.7  < >  --  3.5 3.6 4.0 3.5 3.4* 3.9  CL 105  < >  --  100 96 99 99 97 98  CO2 25  < >  --  29 27 30 30 29 29   GLUCOSE 140*  < >  --  124* 133* 116* 113* 113* 112*  BUN 22  < >  --  22 21 20 18 20 18   CREATININE 1.33  < >  --  1.24 1.23 1.18 1.15 1.12 1.09  CALCIUM 8.3*  < >  --  8.0* 8.1* 7.8* 7.9* 8.2* 8.0*  MG 2.2  --  2.0 2.0  --   --  1.8 2.0 1.9  PHOS 4.3  --   --  3.3  --   --  2.0* 2.5 2.3  < > = values in this interval not displayed.  Liver Function Tests:  Recent Labs Lab 10/21/13 1145 10/22/13 0445 10/23/13 0410 10/24/13 0500 10/25/13 0442  AST 149* 82* 36 27 22  ALT 82* 62* 40 30 23  ALKPHOS 79 77 83 81 90  BILITOT 0.6 0.6 0.6 0.7 0.8  PROT 5.1* 5.4* 5.3* 5.3* 5.3*  ALBUMIN 2.7* 3.0* 2.9* 2.7* 2.5*   No results found for this basename: LIPASE, AMYLASE,  in the last 168 hours No results found for this basename: AMMONIA,  in the last 168 hours  CBC:  Recent Labs Lab 10/21/13 0405  10/21/13 1730 10/22/13 0445 10/23/13 0410 10/24/13 0500 10/25/13 0442  WBC 13.1*  < > 12.1* 9.9 8.3 8.4  9.8  NEUTROABS 9.9*  --   --  8.0* 6.4 6.2 7.7  HGB 8.4*  < > 8.4* 8.4* 8.4* 9.4* 9.4*  HCT 24.1*  < > 24.6* 24.6* 24.9* 28.3* 27.8*  MCV 88.9  < > 90.1 89.8 90.9 91.6 91.7  PLT 109*  < > 82* 72* 77* 98* 124*  < > = values in this interval not displayed.  INR:  Recent Labs Lab 10/21/13 0405 10/22/13 0445 10/23/13 0410 10/24/13 0500 10/25/13 0442  INR 1.47 1.14 1.26 2.57* 3.11*   LDH 659 < 726>603>493>458>410  Other results:  EKG:   Imaging: Dg Chest Port 1 View  10/25/2013   CLINICAL DATA:  Chest tubes  EXAM: PORTABLE CHEST - 1 VIEW  COMPARISON:  10/24/2013  FINDINGS: Left ventricular assist device and bilateral chest tubes again noted. No visible pneumothorax. Cardiomegaly. Continued interstitial opacities, likely mild edema, slightly improved on the left. Left base atelectasis. No visible significant effusions.  IMPRESSION: Slight improvement in the interstitial edema, particularly in the left lung. Continued mild edema and left base atelectasis. No pneumothorax.   Electronically Signed   By: Charlett Nose M.D.   On: 10/25/2013 07:38   Dg Chest Port 1 View  10/24/2013   CLINICAL DATA:  Left ventricular assist device.  Chest tubes.  EXAM: PORTABLE CHEST - 1 VIEW  COMPARISON:  10/23/2013.  FINDINGS: The left ventricular assist device, bilateral chest tubes and AICD remain in place, unchanged. Continued interstitial opacities, likely edema. Stable cardiomegaly. Possible small left effusion with left base atelectasis.  IMPRESSION: No significant change since prior study.   Electronically Signed   By: Charlett Nose M.D.   On: 10/24/2013 07:29     Medications:     Scheduled Medications: . amiodarone  400 mg Oral BID  . aspirin EC  325 mg Oral Daily   Or  . aspirin  324 mg Per Tube Daily   Or  . aspirin  300 mg Rectal Daily  . bisacodyl  10 mg Oral Daily   Or  . bisacodyl  10 mg Rectal Daily  . docusate sodium  200 mg Oral Daily  . feeding supplement (ENSURE COMPLETE)  237  mL Oral BID BM  . ferrous gluconate  324 mg Oral BID WC  . furosemide  20 mg Intravenous BID  . guaiFENesin  600 mg Oral BID  . insulin aspart  0-24 Units Subcutaneous Q4H  . metoCLOPramide  10 mg  Oral TID AC  . midodrine  2.5 mg Oral TID WC  . pantoprazole  40 mg Oral Daily  . sodium chloride  10 mL Intravenous Q12H  . sodium chloride  10-40 mL Intracatheter Q12H  . sodium chloride  3 mL Intravenous Q12H  . Warfarin - Physician Dosing Inpatient   Does not apply q1800    Infusions: . sodium chloride 20 mL/hr at 10/22/13 0800  . sodium chloride 20 mL (10/23/13 2347)  . sodium chloride 250 mL (10/20/13 0600)  . amiodarone (NEXTERONE PREMIX) 360 mg/200 mL dextrose 30 mg/hr (10/24/13 1039)  . lactated ringers 20 mL/hr at 10/22/13 0800  . nitroGLYCERIN Stopped (10/19/13 1700)  . phenylephrine (NEO-SYNEPHRINE) Adult infusion Stopped (10/19/13 1700)    PRN Medications: morphine injection, ondansetron (ZOFRAN) IV, oxyCODONE, sodium chloride, sodium chloride, sodium chloride   Assessment:   1) Chronic systolic HF, Class IV, EF 20-25%  --s/p HM II VAD implant 12/15  2) NICM/ICM  3) Recurrent Vtach  - ablation 09/2011 and 06/2013  4) Atrial flutter  - s/p DC-CV 10/15/2013  5) CAD with recanalized occluded RCA with left to right collat.  6) PAD: s/p aorto-bifem, bilateral renal artery bypass, bilateral fem-pop, and left CEA.      Plan/Discussion:    Continues to improve. Hemodynamically much more stable but still having difficulty with ambulation due to weakness.   Ileus improved.   Volume status looks quite good. Will likely change to po diuretics in am  Remains in AF. No further VT. Continue amio  INR 3.1. Will hold coumadin again today. Likely redose in am.    I reviewed the LVAD parameters from today, and compared the results to the patient's prior recorded data.  No programming changes were made.  The LVAD is functioning within specified parameters.  The patient  performs LVAD self-test daily.  LVAD interrogation was negative for any significant power changes, alarms or PI events/speed drops.  LVAD equipment check completed and is in good working order.  Back-up equipment present.   LVAD education done on emergency procedures and precautions and reviewed exit site care.  Length of Stay: 7  Deep Bonawitz BensimhonMD 10/25/2013, 7:50 PM

## 2013-10-25 NOTE — Progress Notes (Signed)
Rhythm changes seen on monitor. EKG done- first one shows afib RVR with frequent PVC's. The second EKG showed sinus tach with frequent, consecutive PVC's. Rhythm is alternation between these, but is spending more time in the "sinus tach with frequent, consecutive PVC's." When rhythm changes to ST, HR increases to upper 120s-130.  PI fluctuating between 4.5-6.0. [name] denies pain or change in how he feels. I-stat Chem 8 done to check potassium- potassium 3.7 per I-stat. Showed Dr. Gala Romney EKG's. Orders received for stat bmet and magnesium and a 300mg  IV Amiodarone bolus. Will continue to closely monitor. Thresa Ross RN

## 2013-10-25 NOTE — Progress Notes (Signed)
Pt reports that AICD has alarmed. [employer] called to make aware- rep is coming out to interrogate. Will monitor  [name]

## 2013-10-25 NOTE — Progress Notes (Signed)
Patient ID: [name], male   DOB: 05-Jun-1941, 72 y.o.   MRN: [id number]  SICU Evening Rounds:  Hemodynamically stable. No PI events, no VT  Diuresing well  Ate better today  Spent a lot of time up in chair  Hopefully can get his pleural tubes and foley out tomorrow and work on PT.

## 2013-10-26 ENCOUNTER — Inpatient Hospital Stay (HOSPITAL_COMMUNITY): Payer: Medicare Other

## 2013-10-26 DIAGNOSIS — I5023 Acute on chronic systolic (congestive) heart failure: Secondary | ICD-10-CM

## 2013-10-26 DIAGNOSIS — Z95818 Presence of other cardiac implants and grafts: Secondary | ICD-10-CM

## 2013-10-26 LAB — COMPREHENSIVE METABOLIC PANEL
ALT: 17 U/L (ref 0–53)
AST: 19 U/L (ref 0–37)
Albumin: 2.2 g/dL — ABNORMAL LOW (ref 3.5–5.2)
Alkaline Phosphatase: 87 U/L (ref 39–117)
BUN: 18 mg/dL (ref 6–23)
CO2: 28 mEq/L (ref 19–32)
Calcium: 7.9 mg/dL — ABNORMAL LOW (ref 8.4–10.5)
Chloride: 97 mEq/L (ref 96–112)
Creatinine, Ser: 1.15 mg/dL (ref 0.50–1.35)
GFR calc Af Amer: 71 mL/min — ABNORMAL LOW (ref >90)
GFR calc non Af Amer: 61 mL/min — ABNORMAL LOW (ref >90)
Glucose, Bld: 101 mg/dL — ABNORMAL HIGH (ref 70–99)
Potassium: 3.5 mEq/L (ref 3.5–5.1)
Sodium: 133 mEq/L — ABNORMAL LOW (ref 135–145)
Total Bilirubin: 0.6 mg/dL (ref 0.3–1.2)
Total Protein: 4.9 g/dL — ABNORMAL LOW (ref 6.0–8.3)

## 2013-10-26 LAB — CARBOXYHEMOGLOBIN
Carboxyhemoglobin: 2.1 % — ABNORMAL HIGH (ref 0.5–1.5)
Methemoglobin: 0.8 % (ref 0.0–1.5)
O2 Saturation: 61.6 %
Total hemoglobin: 9 g/dL — ABNORMAL LOW (ref 13.5–18.0)

## 2013-10-26 LAB — BASIC METABOLIC PANEL
BUN: 20 mg/dL (ref 6–23)
CO2: 29 mEq/L (ref 19–32)
Chloride: 97 mEq/L (ref 96–112)
Creatinine, Ser: 1.22 mg/dL (ref 0.50–1.35)
GFR calc non Af Amer: 57 mL/min — ABNORMAL LOW (ref >90)
Glucose, Bld: 112 mg/dL — ABNORMAL HIGH (ref 70–99)
Potassium: 3.8 mEq/L (ref 3.5–5.1)

## 2013-10-26 LAB — CBC WITH DIFFERENTIAL/PLATELET
Basophils Relative: 0 % (ref 0–1)
Eosinophils Absolute: 0.3 10*3/uL (ref 0.0–0.7)
HCT: 27.8 % — ABNORMAL LOW (ref 39.0–52.0)
Hemoglobin: 9.1 g/dL — ABNORMAL LOW (ref 13.0–17.0)
Lymphocytes Relative: 6 % — ABNORMAL LOW (ref 12–46)
Lymphs Abs: 0.6 10*3/uL — ABNORMAL LOW (ref 0.7–4.0)
MCH: 30.5 pg (ref 26.0–34.0)
MCHC: 32.7 g/dL (ref 30.0–36.0)
Monocytes Relative: 13 % — ABNORMAL HIGH (ref 3–12)
Neutrophils Relative %: 78 % — ABNORMAL HIGH (ref 43–77)
Platelets: 153 10*3/uL (ref 150–400)
RBC: 2.98 MIL/uL — ABNORMAL LOW (ref 4.22–5.81)

## 2013-10-26 LAB — GLUCOSE, CAPILLARY
Glucose-Capillary: 115 mg/dL — ABNORMAL HIGH (ref 70–99)
Glucose-Capillary: 137 mg/dL — ABNORMAL HIGH (ref 70–99)
Glucose-Capillary: 90 mg/dL (ref 70–99)

## 2013-10-26 LAB — URINALYSIS, ROUTINE W REFLEX MICROSCOPIC
Bilirubin Urine: NEGATIVE
Glucose, UA: NEGATIVE mg/dL
Ketones, ur: NEGATIVE mg/dL
Leukocytes, UA: NEGATIVE
Nitrite: NEGATIVE
Protein, ur: NEGATIVE mg/dL
Specific Gravity, Urine: 1.009 (ref 1.005–1.030)
Urobilinogen, UA: 1 mg/dL (ref 0.0–1.0)
pH: 5.5 (ref 5.0–8.0)

## 2013-10-26 LAB — URINE MICROSCOPIC-ADD ON

## 2013-10-26 LAB — PROTIME-INR
INR: 2.55 — ABNORMAL HIGH (ref 0.00–1.49)
Prothrombin Time: 26.6 seconds — ABNORMAL HIGH (ref 11.6–15.2)

## 2013-10-26 LAB — MAGNESIUM: Magnesium: 1.8 mg/dL (ref 1.5–2.5)

## 2013-10-26 LAB — LACTATE DEHYDROGENASE: LDH: 383 U/L — ABNORMAL HIGH (ref 94–250)

## 2013-10-26 LAB — PHOSPHORUS: Phosphorus: 2.7 mg/dL (ref 2.3–4.6)

## 2013-10-26 MED ORDER — METOPROLOL TARTRATE 25 MG PO TABS
25.0000 mg | ORAL_TABLET | Freq: Two times a day (BID) | ORAL | Status: DC
  Administered 2013-10-26 – 2013-10-31 (×11): 25 mg via ORAL
  Filled 2013-10-26 (×12): qty 1

## 2013-10-26 MED ORDER — FUROSEMIDE 40 MG PO TABS
40.0000 mg | ORAL_TABLET | Freq: Two times a day (BID) | ORAL | Status: DC
  Administered 2013-10-26: 40 mg via ORAL
  Filled 2013-10-26 (×4): qty 1

## 2013-10-26 MED ORDER — MAGNESIUM SULFATE 40 MG/ML IJ SOLN
2.0000 g | Freq: Once | INTRAMUSCULAR | Status: AC
  Administered 2013-10-26: 2 g via INTRAVENOUS
  Filled 2013-10-26: qty 50

## 2013-10-26 MED ORDER — POTASSIUM CHLORIDE 10 MEQ/50ML IV SOLN
10.0000 meq | INTRAVENOUS | Status: AC
  Administered 2013-10-26 (×2): 10 meq via INTRAVENOUS
  Filled 2013-10-26: qty 50

## 2013-10-26 MED ORDER — POTASSIUM CHLORIDE 10 MEQ/50ML IV SOLN
10.0000 meq | INTRAVENOUS | Status: AC
  Administered 2013-10-26 (×3): 10 meq via INTRAVENOUS
  Filled 2013-10-26 (×2): qty 50

## 2013-10-26 MED ORDER — INSULIN ASPART 100 UNIT/ML ~~LOC~~ SOLN
0.0000 [IU] | SUBCUTANEOUS | Status: DC
  Administered 2013-10-27: 2 [IU] via SUBCUTANEOUS

## 2013-10-26 MED ORDER — WARFARIN SODIUM 2 MG PO TABS
2.0000 mg | ORAL_TABLET | Freq: Once | ORAL | Status: AC
  Administered 2013-10-26: 2 mg via ORAL
  Filled 2013-10-26: qty 1

## 2013-10-26 MED FILL — Medication: Qty: 1 | Status: AC

## 2013-10-26 NOTE — Progress Notes (Signed)
Patient ID: CORNEL [name], male   DOB: 1940-11-12, 72 y.o.   MRN: [id number]  HeartMate 2 Rounding Note  Subjective:    [name] is a 72 yo man with an ischemic CM s/p ICD implant, chronic systolic HF (EF 20-25%), paroxysmal VT s/p ablation (09/2011) and repeat (06/2013), CAD, PVD (s/p R fem pop BPG 01/2010; s/p L CFA BPG 05/2002), AAA (s/p repair 04/2002), CAS (s/p L CEA 2008), HTN, arthritis, and TIA. Recently admitted 12/8-12/11/14 for syncopal episode from inappropriate ATP which accelerated SVT>>VT with failed ATP and then spontaneous termination of VT. During hospital stay had repeat RHC, hemodynamics were stable and had TEE-DC-CV of AFL 10/15/13 which was successful. He was presented to our [facility] team and felt to be good LVAD candidate and then presented at [facility] transplant listing conference and they felt he was not a candidate for transplant, but good candidate for LVAD under DT criteria.   POD 7 HeartMate II LVAD  He had a good day yesterday and a good night. Spent a lot of time up in a chair. Ate better and has an appetite this am. Not able to ambulate out in hall yet- orthostatic symptoms better now on midridine Weight up 1 pound- has had a few PI events in last 24 hrs as well as some  wide complex tachycardia treated with more IV amio   Co-ox this am 61 %  His ICD has not shocked since VAD implant.  Nurse reports drive line yesterday looks ok with slight redness around it, minimal bloody drainage, no purulence. LVAD INTERROGATION:  HeartMate II LVAD:  Flow 4.0 liters/min, speed 8800, power 4.5, PI 6.6.  Few PI events recorded  Objective:    Vital Signs:   Temp:  [98.3 F (36.8 C)-100.7 F (38.2 C)] 99.3 F (37.4 C) (12/22 0400) Pulse Rate:  [52-156] 104 (12/22 0642) Resp:  [10-23] 16 (12/22 0700) SpO2:  [91 %-97 %] 95 % (12/22 0600) Weight:  [198 lb 3.1 oz (89.9 kg)] 198 lb 3.1 oz (89.9 kg) (12/22 0600) Last BM Date: 10/18/13 Mean arterial Pressure 78 CVP  8  Intake/Output:   Intake/Output Summary (Last 24 hours) at 10/26/13 0757 Last data filed at 10/26/13 0700  Gross per 24 hour  Intake    530 ml  Output   2075 ml  Net  -1545 ml    Chest tube output 290 for 24 hrs.   Physical Exam: General: Awake and alert. Conversant HEENT: normal  Neck: supple. JVP . Carotids 2+ bilat; no bruits. No lymphadenopathy or thryomegaly appreciated.  Cor: Faint heart sounds , LVAD hum present.  Lungs: clear  Abdomen: soft, nontender, nondistended. No hepatosplenomegaly. No bruits or masses. Active bowel sounds. + bowel movement Driveline: C/D/I; securement device intact and driveline incorporated  Extremities: peripheral edema is decreasing - warm and well perfused Neuro: alert & orientedx3, cranial nerves grossly intact. moves all 4 extremities w/o difficulty. Affect pleasant    Telemetry: A-fib 108  Labs: Basic Metabolic Panel:  Recent Labs Lab 10/22/13 0445  10/23/13 0410 10/24/13 0500 10/25/13 0442 10/25/13 2320 10/25/13 2346 10/26/13 0420  NA 136  < > 136 135 135 135 133* 133*  K 3.5  < > 3.5 3.4* 3.9 3.7 3.8 3.5  CL 100  < > 99 97 98 95* 97 97  CO2 29  < > 30 29 29   --  29 28  GLUCOSE 124*  < > 113* 113* 112* 113* 112* 101*  BUN 22  < >  18 20 18 18 20 18   CREATININE 1.24  < > 1.15 1.12 1.09 1.20 1.22 1.15  CALCIUM 8.0*  < > 7.9* 8.2* 8.0*  --  7.9* 7.9*  MG 2.0  --  1.8 2.0 1.9  --  1.9 1.8  PHOS 3.3  --  2.0* 2.5 2.3  --   --  2.7  < > = values in this interval not displayed.  Liver Function Tests:  Recent Labs Lab 10/22/13 0445 10/23/13 0410 10/24/13 0500 10/25/13 0442 10/26/13 0420  AST 82* 36 27 22 19   ALT 62* 40 30 23 17   ALKPHOS 77 83 81 90 87  BILITOT 0.6 0.6 0.7 0.8 0.6  PROT 5.4* 5.3* 5.3* 5.3* 4.9*  ALBUMIN 3.0* 2.9* 2.7* 2.5* 2.2*   No results found for this basename: LIPASE, AMYLASE,  in the last 168 hours No results found for this basename: AMMONIA,  in the last 168 hours  CBC:  Recent Labs Lab  10/22/13 0445 10/23/13 0410 10/24/13 0500 10/25/13 0442 10/25/13 2320 10/26/13 0420  WBC 9.9 8.3 8.4 9.8  --  10.0  NEUTROABS 8.0* 6.4 6.2 7.7  --  7.7  HGB 8.4* 8.4* 9.4* 9.4* 9.5* 9.1*  HCT 24.6* 24.9* 28.3* 27.8* 28.0* 27.8*  MCV 89.8 90.9 91.6 91.7  --  93.3  PLT 72* 77* 98* 124*  --  153    INR:  Recent Labs Lab 10/22/13 0445 10/23/13 0410 10/24/13 0500 10/25/13 0442 10/26/13 0420  INR 1.14 1.26 2.57* 3.11* 2.55*   LDH 659 < 726>603>493>458>410  Other results:  EKG:   Imaging: Dg Chest Port 1 View  10/26/2013   CLINICAL DATA:  Chest tubes.  Left ventricular assist device .  EXAM: PORTABLE CHEST - 1 VIEW  COMPARISON:  10/25/2013.  FINDINGS: Bilateral chest tubes again noted in stable position. A very miniscule left apical pneumothorax cannot be excluded. Cardiomegaly. Prior median sternotomy. Cardiac pacer noted with lead tips in right atrium. Left ventricular assist device noted. Persistent pulmonary interstitial prominence noted consistent with pulmonary interstitial edema. Interstitial edema may have increased slightly from prior exam.  IMPRESSION: 1. Stable line and tube positions. Left ventricular assist device noted. Bilateral chest tubes are noted. 2. Miniscule left apical pneumothorax cannot be completely excluded. 3. Mild progression of congestive heart failure and pulmonary interstitial edema. These results will be called to the ordering clinician or representative by the Radiologist Assistant, and communication documented in the PACS Dashboard.   Electronically Signed   By: Maisie Fus  Register   On: 10/26/2013 07:54   Dg Chest Port 1 View  10/25/2013   CLINICAL DATA:  Chest tubes  EXAM: PORTABLE CHEST - 1 VIEW  COMPARISON:  10/24/2013  FINDINGS: Left ventricular assist device and bilateral chest tubes again noted. No visible pneumothorax. Cardiomegaly. Continued interstitial opacities, likely mild edema, slightly improved on the left. Left base atelectasis. No  visible significant effusions.  IMPRESSION: Slight improvement in the interstitial edema, particularly in the left lung. Continued mild edema and left base atelectasis. No pneumothorax.   Electronically Signed   By: Charlett Nose M.D.   On: 10/25/2013 07:38     Medications:     Scheduled Medications: . amiodarone  400 mg Oral BID  . aspirin EC  325 mg Oral Daily   Or  . aspirin  324 mg Per Tube Daily   Or  . aspirin  300 mg Rectal Daily  . bisacodyl  10 mg Oral Daily   Or  .  bisacodyl  10 mg Rectal Daily  . docusate sodium  200 mg Oral Daily  . feeding supplement (ENSURE COMPLETE)  237 mL Oral BID BM  . ferrous gluconate  324 mg Oral BID WC  . furosemide  40 mg Oral BID  . guaiFENesin  600 mg Oral BID  . insulin aspart  0-24 Units Subcutaneous Q4H  . magnesium sulfate 1 - 4 g bolus IVPB  2 g Intravenous Once  . metoCLOPramide  10 mg Oral TID AC  . midodrine  2.5 mg Oral TID WC  . pantoprazole  40 mg Oral Daily  . potassium chloride  10 mEq Intravenous Q1 Hr x 3  . potassium chloride  10 mEq Intravenous Q1 Hr x 2  . warfarin  2 mg Oral ONCE-1800  . Warfarin - Physician Dosing Inpatient   Does not apply q1800    Infusions: . sodium chloride 20 mL/hr at 10/22/13 0800  . sodium chloride 20 mL (10/23/13 2347)  . sodium chloride 250 mL (10/20/13 0600)  . amiodarone (NEXTERONE PREMIX) 360 mg/200 mL dextrose 30 mg/hr (10/24/13 1039)  . lactated ringers 20 mL/hr at 10/22/13 0800    PRN Medications: morphine injection, ondansetron (ZOFRAN) IV, oxyCODONE, sodium chloride   Assessment:   1) Chronic systolic HF, Class IV, EF 20-25%  --s/p HM II VAD implant 12/15  2) NICM/ICM  3) Recurrent Vtach  - ablation 09/2011 and 06/2013  4) Atrial flutter  - s/p DC-CV 10/15/2013  5) CAD with recanalized occluded RCA with left to right collat.  6) PAD: s/p aorto-bifem, bilateral renal artery bypass, bilateral fem-pop, and left CEA.      Plan/Discussion:    Took first walk in  hallway today  Acute blood loss anemia: stable Hgb, on po Fe  INR up to 2.55 with no coumadin yesterday, probably related to decreased nutritional state, amiodarone. Will give low dose amio 2 mg today  Chest tube output is decreasing. The pocket drain l was removed. The pleural fluid is serosanguinous and thin.Will remove L pleural tube today- R pleural tube with 300 cc output Cont PT- ambulationShould be ready to transfer to stepdown tomorrow Cont VAD training, education.  I reviewed the LVAD parameters from today, and compared the results to the patient's prior recorded data.  No programming changes were made.  The LVAD is functioning within specified parameters.  The patient performs LVAD self-test daily.  LVAD interrogation was negative for any significant power changes, alarms or PI events/speed drops.  LVAD equipment check completed and is in good working order.  Back-up equipment present.   LVAD education done on emergency procedures and precautions and reviewed exit site care.  Length of Stay: 8  VAN TRIGT III,PETER 10/26/2013, 7:57 AM

## 2013-10-26 NOTE — Progress Notes (Signed)
Physical Therapy Treatment Patient Details Name: [name] MRN: [id number] DOB: August 18, 1941 Today's Date: 10/26/2013 Time: 6213-0865 PT Time Calculation (min): 24 min  PT Assessment / Plan / Recommendation  History of Present Illness Pt admit for LVAD placement.     PT Comments   Pt only made it to EOB sitting briefly this pm. Pt with complaint of fatigue at beginning of session. PI 5.2 at beginning of session with HR 106. While sitting EOB, pt with complaint of dizziness "the room is spinning". PI 4.2-4.8 and HR 119 in Afib. Pt. Returned to supine with horizontal nystagmus noted. Question if pt is not experiencing vertigo due to presence of nystagmus. May benefit from vestibular assessment. RN notified.    Follow Up Recommendations  Home health PT;Supervision/Assistance - 24 hour     Does the patient have the potential to tolerate intense rehabilitation     Barriers to Discharge        Equipment Recommendations  None recommended by PT    Recommendations for Other Services    Frequency Min 5X/week   Progress towards PT Goals Progress towards PT goals: Not progressing toward goals - comment (dizziness)  Plan Current plan remains appropriate    Precautions / Restrictions Precautions Precautions: Sternal;Fall Precaution Comments: LVAD.  mulitple lines Required Braces or Orthoses: Other Brace/Splint Other Brace/Splint: strap for controller; vest for batteries; needs to take bag with back up supplies with him whenever he leaves his room Restrictions Weight Bearing Restrictions: Yes (sternal precautions) Other Position/Activity Restrictions: sternal precautions   Pertinent Vitals/Pain See above comments.    Mobility  Bed Mobility Bed Mobility: Rolling Right;Right Sidelying to Sit;Sitting - Scoot to [place] of Bed;Sit to Sidelying Right Rolling Right: 1: +2 Total assist Rolling Right: Patient Percentage: 30% Right Sidelying to Sit: 1: +2 Total assist Right Sidelying to  Sit: Patient Percentage: 20% Sit to Sidelying Right: 1: +2 Total assist Sit to Sidelying Right: Patient Percentage: 20% Details for Bed Mobility Assistance: Pt fatigued this afternoon.  Required assist for all aspects.  He was able to verbalize and adhere to sternal precautions and able to verbally instruct therapist in managing controller during mobility     Exercises     PT Diagnosis:    PT Problem List:   PT Treatment Interventions:     PT Goals (current goals can now be found in the care plan section)    Visit Information  Last PT Received On: 10/26/13 Assistance Needed: +2 PT/OT/SLP Co-Evaluation/Treatment: Yes Reason for Co-Treatment: Complexity of the patient's impairments (multi-system involvement);For patient/therapist safety PT goals addressed during session: Mobility/safety with mobility OT goals addressed during session: Other (comment);Strengthening/ROM (functional transfers) History of Present Illness: Pt admit for LVAD placement.      Subjective Data      Cognition  Cognition Arousal/Alertness: Awake/alert Behavior During Therapy: WFL for tasks assessed/performed Overall Cognitive Status: Within Functional Limits for tasks assessed    Balance  Balance Balance Assessed: Yes Static Sitting Balance Static Sitting - Balance Support: Feet supported Static Sitting - Level of Assistance: 4: Min assist Static Sitting - Comment/# of Minutes: Pt sat EOB ~5 mins with min A and a brief period of min guard assist. Pt with c/o fatigue and feeling dizzy, "like the room is spinning". PI starting was 5.2 and 4.3-4.8 while EOB with HR up to 119 in afib.  Pt returned to supine with horizontal nystagmus noted.  End of Session PT - End of Session Equipment Utilized During Treatment: Oxygen Activity  Tolerance: Treatment limited secondary to medical complications (Comment) (dizziness) Patient left: in bed;with call bell/phone within reach Nurse Communication: Mobility status;Other  (comment) (dizziness and nystagmus)   GP     Evana Runnels 10/26/2013, 4:05 PM  [facility] PT [phone]

## 2013-10-26 NOTE — Progress Notes (Addendum)
Patient ID: [name], male   DOB: [birth year], 72 y.o.   MRN: [id number]  HeartMate 2 Rounding Note  Subjective:    [name] is a 72 yo man with an ischemic CM s/p ICD implant, chronic systolic HF (EF 20-25%), paroxysmal VT s/p ablation (09/2011) and repeat (06/2013), CAD, PVD (s/p R fem pop BPG 01/2010; s/p L CFA BPG 05/2002), AAA (s/p repair 04/2002), CAS (s/p L CEA 2008), HTN, arthritis, and TIA. Recently admitted 12/8-12/11/14 for syncopal episode from inappropriate ATP which accelerated SVT>>VT with failed ATP and then spontaneous termination of VT. During hospital stay had repeat RHC, hemodynamics were stable and had TEE-DC-CV of AFL 10/15/13 which was successful. He was presented to our [facility] team and felt to be good LVAD candidate and then presented at [facility] transplant listing conference and they felt he was not a candidate for transplant, but good candidate for LVAD under DT criteria.   POD 7 HeartMate II LVAD  Doing very well. Feeling stronger. No further orthostasis. Ileus resolved.  MAPs stable. Co-ox 61% off all drips. Diuresing on lasix 40 po bid.   VT and afib with RVR last night - 300 mg bolus Amio.  Afib 100 - 120 this morning.   INR 2.55 this morning.  Nurse reports drive line yesterday looks ok with slight redness around it, minimal bloody drainage, no purulence. VAD coordinator to do dressing change today.  Low grade fever 100.7  LVAD INTERROGATION:  HeartMate II LVAD:  Flow 4.0 liters/min, speed 8800, power 4.6, PI 6.9. 3-4 PI events throughout the day  Objective:    Vital Signs:   Temp:  [97.9 F (36.6 C)-100.7 F (38.2 C)] 99 F (37.2 C) (12/22 1212) Pulse Rate:  [52-113] 78 (12/22 0800) Resp:  [10-24] 12 (12/22 1200) SpO2:  [90 %-98 %] 98 % (12/22 1200) Weight:  [198 lb 3.1 oz (89.9 kg)] 198 lb 3.1 oz (89.9 kg) (12/22 0600) Last BM Date: 10/18/13 Mean arterial Pressure 78 CVP 4-6  Intake/Output:   Intake/Output Summary (Last 24 hours) at 10/26/13  1351 Last data filed at 10/26/13 1200  Gross per 24 hour  Intake   1020 ml  Output   1790 ml  Net   -770 ml    Chest tube output 290 for 24 hrs.   Physical Exam: General: Awake and alert. Conversant HEENT: normal  Neck: supple. JVP 7 . Carotids 2+ bilat; no bruits. No lymphadenopathy or thryomegaly appreciated.  Cor: Faint heart sounds , LVAD hum present.  Lungs: clear  Abdomen: soft, nontender, nondistended. No hepatosplenomegaly. No bruits or masses. Active bowel sounds.  Driveline: C/D/I; securement device intact and driveline incorporated  Extremities: peripheral edema is decreasing now 1+ Neuro: alert & orientedx3, cranial nerves grossly intact. moves all 4 extremities w/o difficulty. Affect pleasant    Telemetry: A-fib 100-120  Labs: Basic Metabolic Panel:  Recent Labs Lab 10/22/13 0445  10/23/13 0410 10/24/13 0500 10/25/13 0442 10/25/13 2320 10/25/13 2346 10/26/13 0420  NA 136  < > 136 135 135 135 133* 133*  K 3.5  < > 3.5 3.4* 3.9 3.7 3.8 3.5  CL 100  < > 99 97 98 95* 97 97  CO2 29  < > 30 29 29   --  29 28  GLUCOSE 124*  < > 113* 113* 112* 113* 112* 101*  BUN 22  < > 18 20 18 18 20 18   CREATININE 1.24  < > 1.15 1.12 1.09 1.20 1.22 1.15  CALCIUM 8.0*  < >  7.9* 8.2* 8.0*  --  7.9* 7.9*  MG 2.0  --  1.8 2.0 1.9  --  1.9 1.8  PHOS 3.3  --  2.0* 2.5 2.3  --   --  2.7  < > = values in this interval not displayed.  Liver Function Tests:  Recent Labs Lab 10/22/13 0445 10/23/13 0410 10/24/13 0500 10/25/13 0442 10/26/13 0420  AST 82* 36 27 22 19   ALT 62* 40 30 23 17   ALKPHOS 77 83 81 90 87  BILITOT 0.6 0.6 0.7 0.8 0.6  PROT 5.4* 5.3* 5.3* 5.3* 4.9*  ALBUMIN 3.0* 2.9* 2.7* 2.5* 2.2*   No results found for this basename: LIPASE, AMYLASE,  in the last 168 hours No results found for this basename: AMMONIA,  in the last 168 hours  CBC:  Recent Labs Lab 10/22/13 0445 10/23/13 0410 10/24/13 0500 10/25/13 0442 10/25/13 2320 10/26/13 0420  WBC 9.9 8.3  8.4 9.8  --  10.0  NEUTROABS 8.0* 6.4 6.2 7.7  --  7.7  HGB 8.4* 8.4* 9.4* 9.4* 9.5* 9.1*  HCT 24.6* 24.9* 28.3* 27.8* 28.0* 27.8*  MCV 89.8 90.9 91.6 91.7  --  93.3  PLT 72* 77* 98* 124*  --  153    INR:  Recent Labs Lab 10/22/13 0445 10/23/13 0410 10/24/13 0500 10/25/13 0442 10/26/13 0420  INR 1.14 1.26 2.57* 3.11* 2.55*   LDH 659 < 726>603>493>458>410  Other results:  EKG:   Imaging: Dg Chest Port 1 View  10/26/2013   CLINICAL DATA:  Chest tubes.  Left ventricular assist device .  EXAM: PORTABLE CHEST - 1 VIEW  COMPARISON:  10/25/2013.  FINDINGS: Bilateral chest tubes again noted in stable position. A very miniscule left apical pneumothorax cannot be excluded. Cardiomegaly. Prior median sternotomy. Cardiac pacer noted with lead tips in right atrium. Left ventricular assist device noted. Persistent pulmonary interstitial prominence noted consistent with pulmonary interstitial edema. Interstitial edema may have increased slightly from prior exam.  IMPRESSION: 1. Stable line and tube positions. Left ventricular assist device noted. Bilateral chest tubes are noted. 2. Miniscule left apical pneumothorax cannot be completely excluded. 3. Mild progression of congestive heart failure and pulmonary interstitial edema. These results will be called to the ordering clinician or representative by the Radiologist Assistant, and communication documented in the PACS Dashboard.   Electronically Signed   By: Maisie Fus  Register   On: 10/26/2013 07:54   Dg Chest Port 1 View  10/25/2013   CLINICAL DATA:  Chest tubes  EXAM: PORTABLE CHEST - 1 VIEW  COMPARISON:  10/24/2013  FINDINGS: Left ventricular assist device and bilateral chest tubes again noted. No visible pneumothorax. Cardiomegaly. Continued interstitial opacities, likely mild edema, slightly improved on the left. Left base atelectasis. No visible significant effusions.  IMPRESSION: Slight improvement in the interstitial edema, particularly in the  left lung. Continued mild edema and left base atelectasis. No pneumothorax.   Electronically Signed   By: Charlett Nose M.D.   On: 10/25/2013 07:38     Medications:     Scheduled Medications: . amiodarone  400 mg Oral BID  . aspirin EC  325 mg Oral Daily   Or  . aspirin  324 mg Per Tube Daily   Or  . aspirin  300 mg Rectal Daily  . bisacodyl  10 mg Oral Daily   Or  . bisacodyl  10 mg Rectal Daily  . docusate sodium  200 mg Oral Daily  . feeding supplement (ENSURE COMPLETE)  237 mL  Oral BID BM  . ferrous gluconate  324 mg Oral BID WC  . furosemide  40 mg Oral BID  . guaiFENesin  600 mg Oral BID  . insulin aspart  0-24 Units Subcutaneous Q4H  . metoprolol tartrate  25 mg Oral BID  . midodrine  2.5 mg Oral TID WC  . pantoprazole  40 mg Oral Daily  . warfarin  2 mg Oral ONCE-1800  . Warfarin - Physician Dosing Inpatient   Does not apply q1800    Infusions: . sodium chloride 20 mL/hr at 10/22/13 0800  . sodium chloride 20 mL (10/23/13 2347)  . sodium chloride 250 mL (10/20/13 0600)  . amiodarone (NEXTERONE PREMIX) 360 mg/200 mL dextrose 30 mg/hr (10/24/13 1039)  . lactated ringers 20 mL/hr at 10/22/13 0800    PRN Medications: morphine injection, ondansetron (ZOFRAN) IV, oxyCODONE, sodium chloride   Assessment:   1) Chronic systolic HF, Class IV, EF 20-25%  --s/p HM II VAD implant 12/15  2) NICM/ICM  3) Recurrent Vtach  - ablation 09/2011 and 06/2013  4) Atrial flutter  - s/p DC-CV 10/15/2013  5) CAD with recanalized occluded RCA with left to right collat.  6) PAD: s/p aorto-bifem, bilateral renal artery bypass, bilateral fem-pop, and left CEA.      Plan/Discussion:    Continues to improve. Hemodynamically much more stable; ambulated 60 feet this am.   Ileus improved.   Volume status looks quite good.   Remains in AF rate 100 - 120; remains on Amiodarone 400 mg bid; will add Metoprolol 25 bid.  Low grade fever, will check UA with culture today.  INR 2.55.  PharmD to review coumadin dosing today.    I reviewed the LVAD parameters from today, and compared the results to the patient's prior recorded data.  No programming changes were made.  The LVAD is functioning within specified parameters.  The patient performs LVAD self-test daily.  LVAD interrogation was negative for any significant power changes, alarms or PI events/speed drops.  LVAD equipment check completed and is in good working order.  Back-up equipment present.   LVAD education done on emergency procedures and precautions and reviewed exit site care.  Length of Stay: 8  Daniel BensimhonMD  10/26/2013, 1:51 PM

## 2013-10-26 NOTE — Progress Notes (Signed)
  Dressing change per VAD coordinator using sterile technique. Dressing removed, site  cleansed with Chlora prep applicators x 2, dried, and guaze dressing with aquacel strip. The site has minimal tissue ingrowth; two sutures remain in place. The site has erythema under aquacel strip with bloody drainage noted. No tenderness or foul odor;  anchor device in place.  Pt denies fever or chills. Driveline dressing is being changed daily.  VAD discharge binder delivered and left at bedside for patient and caregiver review.

## 2013-10-26 NOTE — Progress Notes (Signed)
Occupational Therapy Treatment Patient Details Name: [name] MRN: [id number] DOB: [birth year] Today's Date: 10/26/2013 Time: 4098-1191 OT Time Calculation (min): 24 min  OT Assessment / Plan / Recommendation  History of present illness Pt admit for LVAD placement.     OT comments  Pt only made it to EOB sitting briefly this pm.  Pt with complaint of fatigue at beginning of session.  PI 5.2 at beginning of session with HR 106.  While sitting EOB, pt with complaint of dizziness "the room is spinning".  PI 4.2-4.8 and HR 119 in Afib.  Pt. Returned to supine with horizontal nystagmus noted.  Question if pt is not experiencing vertigo due to presence of nystagmus.  May benefit from vestibular assessment.  RN notified.   Follow Up Recommendations  Home health OT;Supervision/Assistance - 24 hour    Barriers to Discharge       Equipment Recommendations  3 in 1 bedside comode    Recommendations for Other Services    Frequency Min 3X/week   Progress towards OT Goals Progress towards OT goals: Not progressing toward goals - comment (dizziness)  Plan Discharge plan remains appropriate    Precautions / Restrictions Precautions Precautions: Sternal;Fall Precaution Comments: LVAD.  mulitple lines Required Braces or Orthoses: Other Brace/Splint Other Brace/Splint: strap for controller; vest for batteries; needs to take bag with back up supplies with him whenever he leaves his room Restrictions Weight Bearing Restrictions: Yes Other Position/Activity Restrictions: sternal precautions   Pertinent Vitals/Pain     ADL  ADL Comments: Pt only tolerated moving to EOB today due to dizziness.     OT Diagnosis:    OT Problem List:   OT Treatment Interventions:     OT Goals(current goals can now be found in the care plan section) ADL Goals Pt Will Perform Grooming: with supervision;standing Pt Will Perform Upper Body Bathing: with set-up;sitting Pt Will Perform Lower Body Bathing: with  supervision;sit to/from stand Pt Will Perform Upper Body Dressing: with set-up;sitting Pt Will Perform Lower Body Dressing: with supervision;sit to/from stand Pt Will Transfer to Toilet: with supervision;ambulating;bedside commode;regular height toilet Pt Will Perform Toileting - Clothing Manipulation and hygiene: with supervision;sit to/from stand Additional ADL Goal #1: Pt will be independent with managing LVAD equipment during all BADLs  Additional ADL Goal #2: Pt will be independent with sternal precautions during all BADLs   Visit Information  Last OT Received On: 10/26/13 Assistance Needed: +2 PT/OT/SLP Co-Evaluation/Treatment: Yes Reason for Co-Treatment: Complexity of the patient's impairments (multi-system involvement);For patient/therapist safety OT goals addressed during session: Other (comment);Strengthening/ROM (functional transfers) History of Present Illness: Pt admit for LVAD placement.      Subjective Data      Prior Functioning       Cognition  Cognition Arousal/Alertness: Awake/alert Behavior During Therapy: WFL for tasks assessed/performed Overall Cognitive Status: Within Functional Limits for tasks assessed    Mobility  Bed Mobility Bed Mobility: Rolling Right;Right Sidelying to Sit;Sitting - Scoot to [place] of Bed;Sit to Sidelying Right Rolling Right: 1: +2 Total assist Rolling Right: Patient Percentage: 30% Right Sidelying to Sit: 1: +2 Total assist Right Sidelying to Sit: Patient Percentage: 20% Sit to Sidelying Right: 1: +2 Total assist Sit to Sidelying Right: Patient Percentage: 20% Details for Bed Mobility Assistance: Pt fatigued this afternoon.  Required assist for all aspects.  He was able to verbalize and adhere to sternal precautions and able to verbally instruct therapist in managing controller during mobility  Transfers Transfers: Not assessed  Exercises      Balance Balance Balance Assessed: Yes Static Sitting Balance Static Sitting -  Balance Support: Feet supported Static Sitting - Level of Assistance: 4: Min assist Static Sitting - Comment/# of Minutes: Pt sat EOB ~5 mins with min A and a brief period of min guard assist.  Pt with complaint of fatigue and feeling dizzy, "like the room is spinning".  PI starting was 5.2 and 4.3-4.8 while EOB with HR up to 119 in afib.  Pt returned to supine with horizontal nystagmus noted    End of Session OT - End of Session Activity Tolerance: Other (comment) (dizziness) Patient left: in bed;with call bell/phone within reach;with nursing/sitter in room Nurse Communication: Mobility status  GO     [name] M 10/26/2013, 3:19 PM

## 2013-10-26 NOTE — Progress Notes (Signed)
CSW met with patient at bedside. Patient was sitting up in chair receiving VAD education on switching from battery pack to wall unit. RN reported that he walked the hallway a bit this morning and is a tired from all the morning activity. Patient stated he was tired but feels some improvement overall. CSW will continue to follow for support throughout recovery. Lasandra Beech, LCSW [phone]

## 2013-10-27 ENCOUNTER — Inpatient Hospital Stay (HOSPITAL_COMMUNITY): Payer: Medicare Other

## 2013-10-27 DIAGNOSIS — I509 Heart failure, unspecified: Secondary | ICD-10-CM

## 2013-10-27 DIAGNOSIS — I517 Cardiomegaly: Secondary | ICD-10-CM

## 2013-10-27 DIAGNOSIS — R5381 Other malaise: Secondary | ICD-10-CM

## 2013-10-27 LAB — COMPREHENSIVE METABOLIC PANEL
ALT: 22 U/L (ref 0–53)
AST: 33 U/L (ref 0–37)
Albumin: 2.1 g/dL — ABNORMAL LOW (ref 3.5–5.2)
Alkaline Phosphatase: 86 U/L (ref 39–117)
BUN: 18 mg/dL (ref 6–23)
CO2: 28 mEq/L (ref 19–32)
Calcium: 7.9 mg/dL — ABNORMAL LOW (ref 8.4–10.5)
Chloride: 99 mEq/L (ref 96–112)
Creatinine, Ser: 1.18 mg/dL (ref 0.50–1.35)
GFR calc Af Amer: 68 mL/min — ABNORMAL LOW (ref >90)
GFR calc non Af Amer: 59 mL/min — ABNORMAL LOW (ref >90)
Glucose, Bld: 114 mg/dL — ABNORMAL HIGH (ref 70–99)
Potassium: 3.9 mEq/L (ref 3.5–5.1)
Sodium: 134 mEq/L — ABNORMAL LOW (ref 135–145)
Total Bilirubin: 0.6 mg/dL (ref 0.3–1.2)
Total Protein: 4.9 g/dL — ABNORMAL LOW (ref 6.0–8.3)

## 2013-10-27 LAB — URINE CULTURE
Colony Count: NO GROWTH
Culture: NO GROWTH
Special Requests: NORMAL

## 2013-10-27 LAB — CARBOXYHEMOGLOBIN
Carboxyhemoglobin: 2.5 % — ABNORMAL HIGH (ref 0.5–1.5)
Methemoglobin: 0.7 % (ref 0.0–1.5)
O2 Saturation: 61.4 %
Total hemoglobin: 8.9 g/dL — ABNORMAL LOW (ref 13.5–18.0)

## 2013-10-27 LAB — CBC
HCT: 27.2 % — ABNORMAL LOW (ref 39.0–52.0)
Hemoglobin: 9.1 g/dL — ABNORMAL LOW (ref 13.0–17.0)
MCH: 31.2 pg (ref 26.0–34.0)
MCHC: 33.5 g/dL (ref 30.0–36.0)
Platelets: 190 10*3/uL (ref 150–400)
RBC: 2.92 MIL/uL — ABNORMAL LOW (ref 4.22–5.81)

## 2013-10-27 LAB — GLUCOSE, CAPILLARY: Glucose-Capillary: 120 mg/dL — ABNORMAL HIGH (ref 70–99)

## 2013-10-27 LAB — LACTATE DEHYDROGENASE: LDH: 345 U/L — ABNORMAL HIGH (ref 94–250)

## 2013-10-27 LAB — PROTIME-INR: INR: 2.52 — ABNORMAL HIGH (ref 0.00–1.49)

## 2013-10-27 MED ORDER — WARFARIN SODIUM 2 MG PO TABS
2.0000 mg | ORAL_TABLET | Freq: Once | ORAL | Status: AC
  Administered 2013-10-27: 2 mg via ORAL
  Filled 2013-10-27: qty 1

## 2013-10-27 MED ORDER — TRAMADOL HCL 50 MG PO TABS
50.0000 mg | ORAL_TABLET | Freq: Four times a day (QID) | ORAL | Status: DC | PRN
  Administered 2013-10-31 – 2013-11-02 (×2): 50 mg via ORAL
  Filled 2013-10-27 (×2): qty 1

## 2013-10-27 MED ORDER — MORPHINE SULFATE 2 MG/ML IJ SOLN: 2.0000 mg | INTRAMUSCULAR | Status: AC | PRN

## 2013-10-27 NOTE — Consult Note (Signed)
Physical Medicine and Rehabilitation Consult  Reason for Consult: LVAD patient.  Referring Physician:  Dr. Donata Clay   HPI: [name] is a 72 y.o. male with an ischemic CM s/p ICD implant, chronic systolic HF (EF 20-25%), paroxysmal VT s/p ablation (09/2011) and repeat (06/2013), CAD, PVD (s/p R fem pop BPG 01/2010; s/p L CFA BPG 05/2002), AAA (s/p repair 04/2002), CAS (s/p L CEA 2008), HTN, arthritis, and TIA. Recently admitted 12/8-12/11/14 for syncopal episode from inappropriate ATP which accelerated SVT>>VT with failed ATP and then spontaneous termination of VT. During hospital stay had repeat RHC, hemodynamics were stable and had TEE-DC-CV of AFL 10/15/13 which was successful. Was evaluated at [address] [facility] and not a transplant candidate but as felt to be a good candidate for LVAD. He was admitted on 10/18/13 for LVAD implantation by Dr. Laneta Simmers and extubated without difficulty on 10/20/13. Post op VT treated with multiple rounds of ATP and DC cardioversion as well as titration of amiodarone. BB added for heart rate control.  Has been diuresed for fluid overload and ABLA treated with unit of PRBC.  Arrythmias resolving and started on midodrine to help with orthostatic symptoms.  Therapies ongoing but patient limited by increased dizziness with nystagmus when at EOB. Per reports--did ambulate short distance with nursing yesterday.  Has been sitting up in chair for 3 hours/day.  MD recommending CIR.   Review of Systems  HENT: Negative for hearing loss.   Eyes: Negative for blurred vision and double vision.  Respiratory: Positive for shortness of breath. Negative for wheezing.   Cardiovascular: Negative for chest pain.  Gastrointestinal: Negative for heartburn, vomiting and abdominal pain.  Genitourinary: Negative for urgency and frequency.  Musculoskeletal: Positive for joint pain (mild OA bilateral knees). Negative for back pain and neck pain.  Neurological: Positive for dizziness (in the evenings with  fatigue). Negative for tingling, sensory change and headaches.  Psychiatric/Behavioral: The patient is not nervous/anxious.     Past Medical History  Diagnosis Date  . CAD (coronary artery disease)     s/p MI 1982;  s/p DES to CFX 2011;  s/p NSTEMI 4/12 in setting of VTach;  cath 7/12:  LM ok, LAD mid 30-40%, Dx's with 40%, mCFX stent patent, prox to mid RCA occluded with R to R and L to R collats.  Medical Tx was continued  . Bradycardia   . Ventricular tachycardia     s/p AICD;   h/o ICD shock;  Amiodarone Rx. S/P ablation in 2012  . 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 HTN (hypertension)   . HLD (hyperlipidemia)   . Cytopenia   . Hypothyroidism   . Renal insufficiency   . Claudication   . Chronic systolic heart failure   . Ischemic cardiomyopathy     echo 7/12:  EF 25-30%, mild AI, mod MR, mod LAE  . TIA (transient ischemic attack) 2001  . CVD (cerebrovascular disease)     left CEA 2008  . Stroke   . Anemia   . Inappropriate therapy from implantable cardioverter-defibrillator 04/02/2013    ATP for sinus tach  SVT wavelet identified SVT but was no  passive   . Myocardial infarction 1992  . Anginal pain   . Automatic implantable cardioverter-defibrillator in situ     Medtronic Evera device  serial number [id number]    . Sleep apnea     denies wearing mask (05/25/2013)  . Arthritis     "a little in my knees" (05/25/2013)  . Melanoma of ear 2013    "near left ear" (05/25/2013)   Past Surgical History  Procedure Laterality Date  . Femoral-popliteal bypass graft Right 2011  . Cardiac defibrillator placement  2003; 2005; 05/25/2013    2014: Medtronic Evera device serial number [id number]  . Revascularization / in-situ graft leg    . Renal artery bypass Bilateral 2003  . Back surgery    . Lumbar disc surgery  1974; 2000's  . Knee arthroscopy  Bilateral 1990's  . Elbow arthroscopy Right 1990's  . Cholecystectomy  12/15/?2010  . Lipoma excision Left 2013    "near ear" (05/25/2013)  . Heel spur surgery Right 1990's  . Cataract extraction w/ intraocular lens  implant, bilateral Bilateral 2000  . Carotid endarterectomy Left ~ 2007  . Doppler echocardiography  2011  . Tonsillectomy  1946  . Femoral-popliteal bypass graft Left 05/2002    Hattie Perch 05/06/2002 (05/25/2013)  . Tumor excision Left 1960's    "fatty tumor" (05/25/2013)  . Cardiac electrophysiology study and ablation  2012  . Coronary angioplasty  1992  . Cardiac catheterization      "several" (05/25/2013)  . Coronary angioplasty with stent placement      "last one was 11/2012  (05/25/2013)  . Cardioversion N/A 10/15/2013    Procedure: CARDIOVERSION;  Surgeon: Dolores Patty, MD;  Location: [facility] OR;  Service: Cardiovascular;  Laterality: N/A;  . Insertion of implantable left ventricular assist device N/A 10/19/2013    Procedure: INSERTION OF IMPLANTABLE LEFT VENTRICULAR ASSIST DEVICE;  Surgeon: Alleen Borne, MD;  Location: [facility] OR;  Service: Open Heart Surgery;  Laterality: N/A;  CIRC ARREST  NITRIC OXIDE  MEDTRONIC ICD  . Intraoperative transesophageal echocardiogram N/A 10/19/2013    Procedure: INTRAOPERATIVE TRANSESOPHAGEAL ECHOCARDIOGRAM;  Surgeon: Alleen Borne, MD;  Location: [facility] OR;  Service: Open Heart Surgery;  Laterality: N/A;    Family History  Problem Relation Age of Onset  . Coronary artery disease    . Heart attack Mother   . Coronary artery disease Mother   . Coronary artery disease Father   . Stroke Father     Social History: Lives with significant other. 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 He  reports that he quit smoking about 11 years ago. His smoking use included Cigarettes. He has a 18.5 pack-year smoking history. He has never used smokeless tobacco. He reports that he drinks alcohol--mixed drink occasionally.  He reports that  he does not use illicit drugs.   Allergies  Allergen Reactions  . Demerol [Meperidine]     Paralysis. Could only move eyes.  . Meperidine Hcl Other (See Comments)    "CAN'T MOVE" CATATONIC PER PT.  [name] Opium Other (See Comments)    "CAN'T MOVE" CATATONIC PER PT.    Medications Prior to Admission  Medication Sig Dispense Refill  . amiodarone (PACERONE) 200 MG tablet Take 2 tablets (400 mg total) by mouth daily.  30 tablet  4  . aspirin 81 MG tablet Take 81 mg by mouth daily.       [name] atorvastatin (LIPITOR) 80 MG tablet Take 80 mg by mouth daily.       [name] CALCIUM-MAGNESIUM-ZINC PO Take 3 tablets by mouth at bedtime. 1000 mg calcium, 400 mg magnesium, 25 mg zinc      .  carvedilol (COREG) 6.25 MG tablet Take 1 tablet (6.25 mg total) by mouth 2 (two) times daily with a meal.  60 tablet  4  . enoxaparin (LOVENOX) 100 MG/ML injection Inject 0.85 mLs (85 mg total) into the skin every 12 (twelve) hours.  6 Syringe  0  . furosemide (LASIX) 20 MG tablet Take 20 mg by mouth daily.   1 tablet    . levothyroxine (SYNTHROID) 112 MCG tablet Take 1 tablet (112 mcg total) by mouth daily.      . Multiple Vitamin (MULTIVITAMIN) tablet Take 1 tablet by mouth daily.       . Multiple Vitamins-Minerals (EYE VITAMINS PO) Take 1 capsule by mouth 2 (two) times daily.       . nitroGLYCERIN (NITROSTAT) 0.4 MG SL tablet Place 0.4 mg under the tongue every 5 (five) minutes as needed for chest pain. For chest pain        Home: Home Living Family/patient expects to be discharged to:: Private residence Living Arrangements: Spouse/significant other Available Help at Discharge: Family;Friend(s);Available 24 hours/day Type of Home: House Home Access: Level entry Home Layout: Two level Alternate Level Stairs-Number of Steps: doesnt use second level Home Equipment: Walker - 2 wheels;Cane - quad;Shower seat  Functional History: Prior Function Comments: Pt reports he was independent with all BADLs and IADLs Functional  Status:  Mobility: Bed Mobility Bed Mobility: Rolling Right;Right Sidelying to Sit;Sitting - Scoot to [place] of Bed;Sit to Sidelying Right Rolling Right: 1: +2 Total assist Rolling Right: Patient Percentage: 30% Rolling Left: 3: Mod assist Right Sidelying to Sit: 1: +2 Total assist Right Sidelying to Sit: Patient Percentage: 20% Left Sidelying to Sit: 1: +2 Total assist;HOB elevated Left Sidelying to Sit: Patient Percentage: 50% Supine to Sit: 1: +2 Total assist;HOB elevated Supine to Sit: Patient Percentage: 40% Sitting - Scoot to Edge of Bed: 4: Min assist Sit to Supine: 1: +2 Total assist Sit to Supine: Patient Percentage: 10% Sit to Sidelying Right: 1: +2 Total assist Sit to Sidelying Right: Patient Percentage: 20% Transfers Transfers: Sit to Stand;Stand to Sit;Stand Pivot Transfers Sit to Stand: 1: +2 Total assist;Without upper extremity assist;From bed Sit to Stand: Patient Percentage: 90% Stand to Sit: 1: +2 Total assist;Without upper extremity assist;To bed Stand to Sit: Patient Percentage: 80% Stand Pivot Transfers: 1: +2 Total assist Stand Pivot Transfers: Patient Percentage: 90% Ambulation/Gait Ambulation/Gait Assistance: Not tested (comment) Stairs: No Wheelchair Mobility Wheelchair Mobility: No  ADL: ADL Eating/Feeding: Independent Where Assessed - Eating/Feeding: Chair Grooming: Wash/dry hands;Wash/dry face;Set up Where Assessed - Grooming: Supine, head of bed up Upper Body Bathing: Moderate assistance Where Assessed - Upper Body Bathing: Supine, head of bed up Lower Body Bathing: +1 Total assistance Where Assessed - Lower Body Bathing: Supine, head of bed up;Rolling right and/or left Upper Body Dressing: +1 Total assistance Where Assessed - Upper Body Dressing: Supine, head of bed up Lower Body Dressing: +1 Total assistance Where Assessed - Lower Body Dressing: Supine, head of bed up;Rolling right and/or left Toilet Transfer: +2 Total  assistance Transfers/Ambulation Related to ADLs: Pt transferred to recliner with min A x 2 for safety and monitoring  ADL Comments: Pt only tolerated moving to EOB today due to dizziness.   Cognition: Cognition Overall Cognitive Status: Within Functional Limits for tasks assessed Orientation Level: Oriented X4 Cognition Arousal/Alertness: Awake/alert Behavior During Therapy: WFL for tasks assessed/performed Overall Cognitive Status: Within Functional Limits for tasks assessed  Blood pressure 93/74, pulse 94, temperature 98.3 F (36.8 C), temperature  source Oral, resp. rate 12, height 6' (1.829 m), weight 90 kg (198 lb 6.6 oz), SpO2 96.00%. Physical Exam  Nursing note and vitals reviewed. Constitutional: He is oriented to person, place, and time. He appears well-developed and well-nourished. Nasal cannula in place.  HENT:  Head: Normocephalic and atraumatic.  Eyes: Conjunctivae are normal. Pupils are equal, round, and reactive to light.  Neck: Normal range of motion. Neck supple.  Cardiovascular:  Hum present. HR in 90s  Respiratory: Effort normal. He has decreased breath sounds.  GI: Soft. Bowel sounds are normal. There is no tenderness.  Musculoskeletal: He exhibits tenderness. He exhibits no edema.  Neurological: He is alert and oriented to person, place, and time.  Moves all 4's. Limited by vest, wires, etc. Proximal greater than distal weakness. Needs cues for trunk control and posture.   Skin: Skin is warm and dry.  Sternal incision--clean, dry, intact.   Psychiatric: He has a normal mood and affect. His behavior is normal. Judgment and thought content normal.    Results for orders placed during the hospital encounter of 10/18/13 (from the past 24 hour(s))  GLUCOSE, CAPILLARY     Status: Abnormal   Collection Time    10/26/13  8:18 AM      Result Value Range   Glucose-Capillary 115 (*) 70 - 99 mg/dL  URINALYSIS, ROUTINE W REFLEX MICROSCOPIC     Status: Abnormal    Collection Time    10/26/13  9:43 AM      Result Value Range   Color, Urine YELLOW  YELLOW   APPearance CLEAR  CLEAR   Specific Gravity, Urine 1.009  1.005 - 1.030   pH 5.5  5.0 - 8.0   Glucose, UA NEGATIVE  NEGATIVE mg/dL   Hgb urine dipstick MODERATE (*) NEGATIVE   Bilirubin Urine NEGATIVE  NEGATIVE   Ketones, ur NEGATIVE  NEGATIVE mg/dL   Protein, ur NEGATIVE  NEGATIVE mg/dL   Urobilinogen, UA 1.0  0.0 - 1.0 mg/dL   Nitrite NEGATIVE  NEGATIVE   Leukocytes, UA NEGATIVE  NEGATIVE  URINE MICROSCOPIC-ADD ON     Status: None   Collection Time    10/26/13  9:43 AM      Result Value Range   Squamous Epithelial / LPF RARE  RARE   WBC, UA 0-2  <3 WBC/hpf   RBC / HPF 7-10  <3 RBC/hpf   Bacteria, UA RARE  RARE  GLUCOSE, CAPILLARY     Status: Abnormal   Collection Time    10/26/13 12:14 PM      Result Value Range   Glucose-Capillary 104 (*) 70 - 99 mg/dL  GLUCOSE, CAPILLARY     Status: Abnormal   Collection Time    10/26/13  4:26 PM      Result Value Range   Glucose-Capillary 110 (*) 70 - 99 mg/dL  GLUCOSE, CAPILLARY     Status: None   Collection Time    10/26/13  9:36 PM      Result Value Range   Glucose-Capillary 90  70 - 99 mg/dL  CARBOXYHEMOGLOBIN     Status: Abnormal   Collection Time    10/27/13  4:40 AM      Result Value Range   Total hemoglobin 8.9 (*) 13.5 - 18.0 g/dL   O2 Saturation 40.9     Carboxyhemoglobin 2.5 (*) 0.5 - 1.5 %   Methemoglobin 0.7  0.0 - 1.5 %  LACTATE DEHYDROGENASE     Status: Abnormal   Collection  Time    10/27/13  4:45 AM      Result Value Range   LDH 345 (*) 94 - 250 U/L  PROTIME-INR     Status: Abnormal   Collection Time    10/27/13  4:45 AM      Result Value Range   Prothrombin Time 26.3 (*) 11.6 - 15.2 seconds   INR 2.52 (*) 0.00 - 1.49  COMPREHENSIVE METABOLIC PANEL     Status: Abnormal   Collection Time    10/27/13  4:45 AM      Result Value Range   Sodium 134 (*) 135 - 145 mEq/L   Potassium 3.9  3.5 - 5.1 mEq/L   Chloride 99   96 - 112 mEq/L   CO2 28  19 - 32 mEq/L   Glucose, Bld 114 (*) 70 - 99 mg/dL   BUN 18  6 - 23 mg/dL   Creatinine, Ser 1.61  0.50 - 1.35 mg/dL   Calcium 7.9 (*) 8.4 - 10.5 mg/dL   Total Protein 4.9 (*) 6.0 - 8.3 g/dL   Albumin 2.1 (*) 3.5 - 5.2 g/dL   AST 33  0 - 37 U/L   ALT 22  0 - 53 U/L   Alkaline Phosphatase 86  39 - 117 U/L   Total Bilirubin 0.6  0.3 - 1.2 mg/dL   GFR calc non Af [name] 59 (*) >90 mL/min   GFR calc Af [name] 68 (*) >90 mL/min  CBC     Status: Abnormal   Collection Time    10/27/13  4:45 AM      Result Value Range   WBC 7.7  4.0 - 10.5 K/uL   RBC 2.92 (*) 4.22 - 5.81 MIL/uL   Hemoglobin 9.1 (*) 13.0 - 17.0 g/dL   HCT 09.6 (*) 04.5 - 40.9 %   MCV 93.2  78.0 - 100.0 fL   MCH 31.2  26.0 - 34.0 pg   MCHC 33.5  30.0 - 36.0 g/dL   RDW 81.1 (*) 91.4 - 78.2 %   Platelets 190  150 - 400 K/uL   Dg Chest Port 1 View  10/26/2013   CLINICAL DATA:  Chest tubes.  Left ventricular assist device .  EXAM: PORTABLE CHEST - 1 VIEW  COMPARISON:  10/25/2013.  FINDINGS: Bilateral chest tubes again noted in stable position. A very miniscule left apical pneumothorax cannot be excluded. Cardiomegaly. Prior median sternotomy. Cardiac pacer noted with lead tips in right atrium. Left ventricular assist device noted. Persistent pulmonary interstitial prominence noted consistent with pulmonary interstitial edema. Interstitial edema may have increased slightly from prior exam.  IMPRESSION: 1. Stable line and tube positions. Left ventricular assist device noted. Bilateral chest tubes are noted. 2. Miniscule left apical pneumothorax cannot be completely excluded. 3. Mild progression of congestive heart failure and pulmonary interstitial edema. These results will be called to the ordering clinician or representative by the Radiologist Assistant, and communication documented in the PACS Dashboard.   Electronically Signed   By: Maisie Fus  Register   On: 10/26/2013 07:54    Assessment/Plan: Diagnosis:  deconditioning related to acute on chronic heart failure, s/p LVAD placement 1. Does the need for close, 24 hr/day medical supervision in concert with the patient's rehab needs make it unreasonable for this patient to be served in a less intensive setting? Yes and Potentially 2. Co-Morbidities requiring supervision/potential complications: htn, cad 3. Due to bladder management, bowel management, safety, skin/wound care, disease management, medication administration, pain management and patient  education, does the patient require 24 hr/day rehab nursing? Potentially 4. Does the patient require coordinated care of a physician, rehab nurse, PT (1-2 hrs/day, 5 days/week) and OT (1-2 hrs/day, 5 days/week) to address physical and functional deficits in the context of the above medical diagnosis(es)? Potentially Addressing deficits in the following areas: balance, endurance, locomotion, strength, transferring, bowel/bladder control, bathing, dressing, feeding, grooming and toileting 5. Can the patient actively participate in an intensive therapy program of at least 3 hrs of therapy per day at least 5 days per week? Potentially 6. The potential for patient to make measurable gains while on inpatient rehab is good 7. Anticipated functional outcomes upon discharge from inpatient rehab are supervision to mod I with PT, supervision with OT, n/a with SLP. 8. Estimated rehab length of stay to reach the above functional goals is: ?8-12 days 9. Does the patient have adequate social supports to accommodate these discharge functional goals? Yes 10. Anticipated D/C setting: Home 11. Anticipated post D/C treatments: HH therapy 12. Overall Rehab/Functional Prognosis: excellent  RECOMMENDATIONS: This patient's condition is appropriate for continued rehabilitative care in the following setting: CIR Patient has agreed to participate in recommended program. Yes Note that insurance prior authorization may be required for  reimbursement for recommended care.  Comment: Will follow for functional progress as he progresses from a CV standpoint. Rehab RN to follow up.   Ranelle Oyster, MD, [place] [facility]     10/27/2013

## 2013-10-27 NOTE — Progress Notes (Signed)
Patient ID: [name], male   DOB: May 23, 1941, 72 y.o.   MRN: [id number]  HeartMate 2 Rounding Note  Subjective:    [name] is a 72 yo man with an ischemic CM s/p ICD implant, chronic systolic HF (EF 20-25%), paroxysmal VT s/p ablation (09/2011) and repeat (06/2013), CAD, PVD (s/p R fem pop BPG 01/2010; s/p L CFA BPG 05/2002), AAA (s/p repair 04/2002), CAS (s/p L CEA 2008), HTN, arthritis, and TIA. Recently admitted 12/8-12/11/14 for syncopal episode from inappropriate ATP which accelerated SVT>>VT with failed ATP and then spontaneous termination of VT. During hospital stay had repeat RHC, hemodynamics were stable and had TEE-DC-CV of AFL 10/15/13 which was successful. He was presented to our [facility] team and felt to be good LVAD candidate and then presented at [facility] transplant listing conference and they felt he was not a candidate for transplant, but good candidate for LVAD under DT criteria.   POD 8 HeartMate II LVAD  He had a good day yesterday and a good night.  Tolerated getting out of bed and ambulating in the hall  in the am but was unable to ambulate with PT in the pm due to orthostasis/ presyncope. Weight stable at 198 with normal CVP 8 cm H2O Has had a few PI events in last 24 hrs  No more VT with addition of lopressor to amiodarone, will cont po amio and stop iv drip   Co-ox this am 61 %, stable  His ICD has not shocked since VAD implant.  Nurse reports drive line yesterday looks ok with slight redness around it, minimal bloody drainage, no purulence No more fever, WBC normal 7.7 . LVAD INTERROGATION:  HeartMate II LVAD:  Flow 4.4 liters/min, speed 8800, power 4.5, PI.5.6  Few PI events recorded  Objective:    Vital Signs:   Temp:  [97.3 F (36.3 C)-99 F (37.2 C)] 98.3 F (36.8 C) (12/23 0747) Pulse Rate:  [62-99] 94 (12/23 0700) Resp:  [11-25] 12 (12/23 0700) SpO2:  [90 %-98 %] 96 % (12/23 0700) Weight:  [198 lb 6.6 oz (90 kg)] 198 lb 6.6 oz (90 kg) (12/23  0500) Last BM Date: 10/24/13 Mean arterial Pressure 78 CVP 8  Intake/Output:   Intake/Output Summary (Last 24 hours) at 10/27/13 0813 Last data filed at 10/27/13 0700  Gross per 24 hour  Intake   1359 ml  Output   1080 ml  Net    279 ml    Chest tube output 290 for 24 hrs.   Physical Exam: General: Awake and alert. Conversant HEENT: normal  Neck: supple. JVP normal.. No lymphadenopathy or thryomegaly appreciated.  Cor: Faint heart sounds , LVAD hum present.  Lungs: clear  Abdomen: soft, nontender, nondistended. No hepatosplenomegaly. No bruits or masses. Active bowel sounds. + bowel movement Driveline: C/D/I; securement device intact and driveline incorporated  Extremities: peripheral edema is decreasing - warm and well perfused Neuro: alert & orientedx3, cranial nerves grossly intact. moves all 4 extremities w/o difficulty. Affect pleasant  Surgical incisions all clean and dry  Telemetry: A-fib 92  Labs: Basic Metabolic Panel:  Recent Labs Lab 10/22/13 0445  10/23/13 0410 10/24/13 0500 10/25/13 0442 10/25/13 2320 10/25/13 2346 10/26/13 0420 10/27/13 0445  NA 136  < > 136 135 135 135 133* 133* 134*  K 3.5  < > 3.5 3.4* 3.9 3.7 3.8 3.5 3.9  CL 100  < > 99 97 98 95* 97 97 99  CO2 29  < > 30 29 29   --  29 28 28   GLUCOSE 124*  < > 113* 113* 112* 113* 112* 101* 114*  BUN 22  < > 18 20 18 18 20 18 18   CREATININE 1.24  < > 1.15 1.12 1.09 1.20 1.22 1.15 1.18  CALCIUM 8.0*  < > 7.9* 8.2* 8.0*  --  7.9* 7.9* 7.9*  MG 2.0  --  1.8 2.0 1.9  --  1.9 1.8  --   PHOS 3.3  --  2.0* 2.5 2.3  --   --  2.7  --   < > = values in this interval not displayed.  Liver Function Tests:  Recent Labs Lab 10/23/13 0410 10/24/13 0500 10/25/13 0442 10/26/13 0420 10/27/13 0445  AST 36 27 22 19  33  ALT 40 30 23 17 22   ALKPHOS 83 81 90 87 86  BILITOT 0.6 0.7 0.8 0.6 0.6  PROT 5.3* 5.3* 5.3* 4.9* 4.9*  ALBUMIN 2.9* 2.7* 2.5* 2.2* 2.1*   No results found for this basename: LIPASE,  AMYLASE,  in the last 168 hours No results found for this basename: AMMONIA,  in the last 168 hours  CBC:  Recent Labs Lab 10/22/13 0445 10/23/13 0410 10/24/13 0500 10/25/13 0442 10/25/13 2320 10/26/13 0420 10/27/13 0445  WBC 9.9 8.3 8.4 9.8  --  10.0 7.7  NEUTROABS 8.0* 6.4 6.2 7.7  --  7.7  --   HGB 8.4* 8.4* 9.4* 9.4* 9.5* 9.1* 9.1*  HCT 24.6* 24.9* 28.3* 27.8* 28.0* 27.8* 27.2*  MCV 89.8 90.9 91.6 91.7  --  93.3 93.2  PLT 72* 77* 98* 124*  --  153 190    INR:  Recent Labs Lab 10/23/13 0410 10/24/13 0500 10/25/13 0442 10/26/13 0420 10/27/13 0445  INR 1.26 2.57* 3.11* 2.55* 2.52*   LDH 659 < 726>603>493>458>410  Other results:  EKG:   Imaging: Dg Chest Port 1 View  10/27/2013   CLINICAL DATA:  Ventricular assist device placement  EXAM: PORTABLE CHEST - 1 VIEW  COMPARISON:  Yesterday  FINDINGS: Left chest tube removed. Trace left apical pneumothorax less than 1% improved right chest tube, left subclavian AICD device, right PICC, ventricular assist device stable in position. Diffuse interstitial edema stable. No right pneumothorax. Cardiomegaly.  IMPRESSION: Left chest tube removed.  Trace left apical pneumothorax improved.  Stable interstitial edema.   Electronically Signed   By: Maryclare Bean M.D.   On: 10/27/2013 08:05   Dg Chest Port 1 View  10/26/2013   CLINICAL DATA:  Chest tubes.  Left ventricular assist device .  EXAM: PORTABLE CHEST - 1 VIEW  COMPARISON:  10/25/2013.  FINDINGS: Bilateral chest tubes again noted in stable position. A very miniscule left apical pneumothorax cannot be excluded. Cardiomegaly. Prior median sternotomy. Cardiac pacer noted with lead tips in right atrium. Left ventricular assist device noted. Persistent pulmonary interstitial prominence noted consistent with pulmonary interstitial edema. Interstitial edema may have increased slightly from prior exam.  IMPRESSION: 1. Stable line and tube positions. Left ventricular assist device noted.  Bilateral chest tubes are noted. 2. Miniscule left apical pneumothorax cannot be completely excluded. 3. Mild progression of congestive heart failure and pulmonary interstitial edema. These results will be called to the ordering clinician or representative by the Radiologist Assistant, and communication documented in the PACS Dashboard.   Electronically Signed   By: Maisie Fus  Register   On: 10/26/2013 07:54     Medications:     Scheduled Medications: . amiodarone  400 mg Oral BID  . aspirin EC  325 mg Oral Daily   Or  . aspirin  324 mg Per Tube Daily   Or  . aspirin  300 mg Rectal Daily  . bisacodyl  10 mg Oral Daily   Or  . bisacodyl  10 mg Rectal Daily  . docusate sodium  200 mg Oral Daily  . feeding supplement (ENSURE COMPLETE)  237 mL Oral BID BM  . ferrous gluconate  324 mg Oral BID WC  . guaiFENesin  600 mg Oral BID  . metoprolol tartrate  25 mg Oral BID  . midodrine  2.5 mg Oral TID WC  . pantoprazole  40 mg Oral Daily  . warfarin  2 mg Oral ONCE-1800  . Warfarin - Physician Dosing Inpatient   Does not apply q1800    Infusions: . sodium chloride 20 mL/hr at 10/22/13 0800  . sodium chloride 20 mL/hr at 10/27/13 0400  . sodium chloride 250 mL (10/20/13 0600)  . lactated ringers 20 mL/hr at 10/22/13 0800    PRN Medications: morphine injection, ondansetron (ZOFRAN) IV, oxyCODONE, sodium chloride, traMADol   Assessment:   1) Chronic systolic HF, Class IV, EF 20-25%  --s/p HM II VAD implant 12/15  2) NICM/ICM  3) Recurrent Vtach  - ablation 09/2011 and 06/2013  4) Atrial flutter  - s/p DC-CV 10/15/2013  5) CAD with recanalized occluded RCA with left to right collat.  6) PAD: s/p aorto-bifem, bilateral renal artery bypass, bilateral fem-pop, and left CEA.      Plan/Discussion:    Took first walk in hallway yesterday- aim for 2 walks today  Acute blood loss anemia: stable Hgb, on po Fe  INR up to 2.5 will redose coumadin 2 mg today  Chest tube output is  decreasing. The pocket drain  was removed. The pleural fluid is minimal last 24hr and serosanguinous and thin.Will remove R pleural tube today --  hope transfer to stepdown tomorrow if he tolerates OOB/ ambulation today- lasix stopped  for possible hypovolemia. Cont VAD training, education.  I reviewed the LVAD parameters from today, and compared the results to the patient's prior recorded data.  No programming changes were made.  The LVAD is functioning within specified parameters.  The patient performs LVAD self-test daily.  LVAD interrogation was negative for any significant power changes, alarms or PI events/speed drops.  LVAD equipment check completed and is in good working order.  Back-up equipment present.   LVAD education done on emergency procedures and precautions and reviewed exit site care.  Length of Stay: 9  VAN TRIGT III,Ollie Delano 10/27/2013, 8:13 AM

## 2013-10-27 NOTE — Progress Notes (Signed)
Physical Therapy Treatment Patient Details Name: [name] MRN: [id number] DOB: 02/02/1941 Today's Date: 10/27/2013 Time: 1191-4782 PT Time Calculation (min): 43 min  PT Assessment / Plan / Recommendation  History of Present Illness Pt admit for LVAD placement.     PT Comments   Pt admitted with above. Pt currently with functional limitations due to continued issues with dizziness and poor endurance.  Treatment today for BPPV did seem to be effective with pt able to ambulate after treatment increasing distance.    Pt will benefit from skilled PT to increase their independence and safety with mobility to allow discharge to the venue listed below.   Follow Up Recommendations  Home health PT;Supervision/Assistance - 24 hour     Does the patient have the potential to tolerate intense rehabilitation   yes          Equipment Recommendations  None recommended by PT        Frequency Min 5X/week   Progress towards PT Goals Progress towards PT goals: Progressing toward goals  Plan Current plan remains appropriate    Precautions / Restrictions Precautions Precautions: Sternal;Fall Precaution Comments: LVAD.  mulitple lines Required Braces or Orthoses: Other Brace/Splint Other Brace/Splint: strap for controller; vest for batteries; needs to take bag with back up supplies with him whenever he leaves his room Restrictions Weight Bearing Restrictions: Yes (sternal precautions)   Pertinent Vitals/Pain VSS- PI initially 6.8.  Dropped to 3.4 with ambulation just briefly with pt asymptomatic and then rose to 4.6-4.9 throughout walk.  Pt on 2LO2 with ambulation.  No pain.    Mobility  Bed Mobility Bed Mobility: Rolling Left;Left Sidelying to Sit;Sitting - Scoot to [place] of Bed;Scooting to [facility] Rolling Right: Not tested (comment) Rolling Left: 3: Mod assist Right Sidelying to Sit: Not tested (comment) Left Sidelying to Sit: 1: +2 Total assist;HOB elevated Left Sidelying to Sit: Patient  Percentage: 50% Supine to Sit: Not tested (comment) Sitting - Scoot to Edge of Bed: 4: Min assist Sit to Supine: Not Tested (comment) Sit to Sidelying Right: Not Tested (comment) Scooting to HOB: 1: +2 Total assist Scooting to [facility]: Patient Percentage: 0% Details for Bed Mobility Assistance: Initially upon arrival, tested pt for horizontal canal BPPV.  Negative test bil.  Then tested pt for right posterior canal BPPV with positive symptoms therefore treated with canalith repositioning maneuver for right BPPV.  Sat up from treatment with pt needing cues for technique. No symptoms of vertigo reported for rest of time with pt.   Remembers to hold his pillow without cues.  Requires assist for all mobility to include moving LEs off bed as well as for elevation of trunk.  Pt then continued education re: set up of batteries, system controller case and vest with min to mod cues overall.   Transfers Transfers: Sit to Stand;Stand to Sit;Stand Pivot Transfers Sit to Stand: 1: +2 Total assist;Without upper extremity assist;From bed Sit to Stand: Patient Percentage: 90% Stand to Sit: 1: +2 Total assist;Without upper extremity assist;To bed Stand to Sit: Patient Percentage: 80% Stand Pivot Transfers: Not tested (comment) Details for Transfer Assistance: Pt moved to stand with assist to steady holding pillow.   Ambulation/Gait Ambulation/Gait Assistance: 1: +2 Total assist Ambulation/Gait: Patient Percentage: 80% Ambulation Distance (Feet): 148 Feet Assistive device: Rolling walker Ambulation/Gait Assistance Details: Pt ambulated pushing wheelchair on 2LO2.  Pt needed occasional assist with steering and occasional cues to slow down.  Positioned pt in chair upon return.  OT to come in  and continue working with pt therefore left pt on back up batteries per OT request.  Nursing aware.   Gait Pattern: Within Functional Limits Stairs: No Wheelchair Mobility Wheelchair Mobility: No     PT Goals (current goals  can now be found in the care plan section)    Visit Information  Last PT Received On: 10/27/13 Assistance Needed: +2 History of Present Illness: Pt admit for LVAD placement.      Subjective Data  Subjective: "I hope the treatment works."   Copywriter, advertising Arousal/Alertness: Awake/alert Behavior During Therapy: WFL for tasks assessed/performed Overall Cognitive Status: Within Functional Limits for tasks assessed    Balance  Static Sitting Balance Static Sitting - Balance Support: Feet supported Static Sitting - Level of Assistance: 5: Stand by assistance Static Sitting - Comment/# of Minutes: Sat EOB 5 min with min guard assist.  No dizziness noted. Static Standing Balance Static Standing - Balance Support: No upper extremity supported Static Standing - Level of Assistance: 1: +2 Total assist;Patient percentage (comment) (pt =90%)  End of Session PT - End of Session Equipment Utilized During Treatment: Oxygen Activity Tolerance: Patient tolerated treatment well Patient left: in chair;with call bell/phone within reach Nurse Communication: Mobility status       [name],Brittania Sudbeck 10/27/2013, 10:58 AM [facility] [phone] (pager)

## 2013-10-27 NOTE — Progress Notes (Signed)
I will follow pt's progress with therapy to assist in determining rehab venue needs. [phone]

## 2013-10-27 NOTE — Progress Notes (Signed)
Pt complaining of pain, fatigue and SOB while sitting in chair.  Pt given 10mg  oxycodone and coached on deep breathing.  Oxygen sat 95%.  Pt assisted back to bed from chair.  No dizziness and some SOB noted during activity.  PI dipped to 2.9.  After patient positioned comfortably in bed PI increased to 7.  No new alarms or events in LVAD history noted.  Will continue to monitor.

## 2013-10-27 NOTE — Progress Notes (Signed)
Occupational Therapy Treatment Patient Details Name: [name] MRN: [id number] DOB: Mar 14, 1941 Today's Date: 10/27/2013 Time: 8657-8469 OT Time Calculation (min): 16 min  OT Assessment / Plan / Recommendation  History of present illness Pt admit for LVAD placement.     OT comments  Attempted to see pt earlier, but he was eating lunch.  Upon return, pt fatigued.  Assisted him back to bed as he was unable to engage in further activity.   Pt making slow progress.   Follow Up Recommendations  Home health OT;Supervision/Assistance - 24 hour    Barriers to Discharge       Equipment Recommendations  3 in 1 bedside comode    Recommendations for Other Services    Frequency Min 3X/week   Progress towards OT Goals Progress towards OT goals: Progressing toward goals (slowly )  Plan Discharge plan remains appropriate    Precautions / Restrictions Precautions Precautions: Sternal;Fall Precaution Comments: LVAD.  mulitple lines Required Braces or Orthoses: Other Brace/Splint Other Brace/Splint: strap for controller; vest for batteries; needs to take bag with back up supplies with him whenever he leaves his room Restrictions Weight Bearing Restrictions: Yes (sternal precautions)   Pertinent Vitals/Pain     ADL  Toilet Transfer: +2 Total assistance Toilet Transfer: Patient Percentage: 50% Toilet Transfer Method: Sit to stand;Stand pivot ADL Comments: Pt up in chair fatigued, unable to tolerate anything more than transfer back to bed    OT Diagnosis:    OT Problem List:   OT Treatment Interventions:     OT Goals(current goals can now be found in the care plan section) Acute Rehab OT Goals OT Goal Formulation: With patient ADL Goals Pt Will Perform Grooming: with supervision;standing Pt Will Perform Upper Body Bathing: with set-up;sitting Pt Will Perform Lower Body Bathing: with supervision;sit to/from stand Pt Will Perform Upper Body Dressing: with set-up;sitting Pt Will  Perform Lower Body Dressing: with supervision;sit to/from stand Pt Will Transfer to Toilet: with supervision;ambulating;bedside commode;regular height toilet Pt Will Perform Toileting - Clothing Manipulation and hygiene: with supervision;sit to/from stand Additional ADL Goal #1: Pt will be independent with managing LVAD equipment during all BADLs  Additional ADL Goal #2: Pt will be independent with sternal precautions during all BADLs   Visit Information  Last OT Received On: 10/27/13 Assistance Needed: +2 History of Present Illness: Pt admit for LVAD placement.      Subjective Data      Prior Functioning       Cognition  Cognition Arousal/Alertness: Awake/alert Behavior During Therapy: WFL for tasks assessed/performed Overall Cognitive Status: Within Functional Limits for tasks assessed    Mobility  Bed Mobility Bed Mobility: Rolling Right;Sit to Supine Rolling Right: 3: Mod assist Sit to Supine: 1: +2 Total assist Sit to Supine: Patient Percentage: 30% Details for Bed Mobility Assistance: Pt requires assist to turn and to lift legs onto bed  Transfers Transfers: Sit to Stand;Stand to Sit Sit to Stand: 1: +2 Total assist;Without upper extremity assist;From chair/3-in-1 Sit to Stand: Patient Percentage: 50% Stand to Sit: 1: +2 Total assist;Without upper extremity assist;To bed Stand to Sit: Patient Percentage: 80% Details for Transfer Assistance: Pt fatigued.  Required increased assistance to move to standing from recliner and assist to control descent onto bed     Exercises      Balance     End of Session OT - End of Session Activity Tolerance: Patient limited by fatigue Patient left: in bed;with call bell/phone within reach;with nursing/sitter in room Nurse  Communication: Mobility status  GO     [name] M 10/27/2013, 6:22 PM

## 2013-10-27 NOTE — Progress Notes (Signed)
Patient ID: [name], male   DOB: 13-Dec-1940, 72 y.o.   MRN: [id number]  HeartMate 2 Rounding Note  Subjective:    [name] is a 72 yo man with an ischemic CM s/p ICD implant, chronic systolic HF (EF 20-25%), paroxysmal VT s/p ablation (09/2011) and repeat (06/2013), CAD, PVD (s/p R fem pop BPG 01/2010; s/p L CFA BPG 05/2002), AAA (s/p repair 04/2002), CAS (s/p L CEA 2008), HTN, arthritis, and TIA. Recently admitted 12/8-12/11/14 for syncopal episode from inappropriate ATP which accelerated SVT>>VT with failed ATP and then spontaneous termination of VT. During hospital stay had repeat RHC, hemodynamics were stable and had TEE-DC-CV of AFL 10/15/13 which was successful. He was presented to our [facility] team and felt to be good LVAD candidate and then presented at [facility] transplant listing conference and they felt he was not a candidate for transplant, but good candidate for LVAD under DT criteria.   POD 8 HeartMate II LVAD  Doing  well. Feeling stronger. Able to ambulate yesterday am but got orthostatic int he afternoon.   MAPs stable 72-74. Co-ox 61% off all drips.   VT and afib witHR now in 90s with addition of metoprolol  INR 2.52 this morning.(stable from 2.55)   LVAD INTERROGATION:  HeartMate II LVAD:  Flow 3.9 liters/min, speed 8800, power 4.5, PI 5.7  Occasional  PI events throughout the day  Objective:    Vital Signs:   Temp:  [97.3 F (36.3 C)-99 F (37.2 C)] 98.5 F (36.9 C) (12/23 0400) Pulse Rate:  [62-99] 94 (12/23 0700) Resp:  [11-25] 12 (12/23 0700) SpO2:  [90 %-98 %] 96 % (12/23 0700) Weight:  [90 kg (198 lb 6.6 oz)] 90 kg (198 lb 6.6 oz) (12/23 0500) Last BM Date: 10/24/13 Mean arterial Pressure 78 CVP 4-6  Intake/Output:   Intake/Output Summary (Last 24 hours) at 10/27/13 0736 Last data filed at 10/27/13 0700  Gross per 24 hour  Intake   1409 ml  Output   1170 ml  Net    239 ml    Chest tube output 290 for 24 hrs.   Physical Exam: General: Awake and  alert. Conversant HEENT: normal  Neck: supple. JVP 7 . Carotids 2+ bilat; no bruits. No lymphadenopathy or thryomegaly appreciated.  Cor: Faint heart sounds , LVAD hum present.  Lungs: clear  Abdomen: soft, nontender, nondistended. No hepatosplenomegaly. No bruits or masses. Active bowel sounds.  Driveline: C/D/I; securement device intact and driveline incorporated  Extremities: peripheral edema is decreasing now 1+ Neuro: alert & orientedx3, cranial nerves grossly intact. moves all 4 extremities w/o difficulty. Affect pleasant    Telemetry: A-fib 90s  Labs: Basic Metabolic Panel:  Recent Labs Lab 10/22/13 0445  10/23/13 0410 10/24/13 0500 10/25/13 0442 10/25/13 2320 10/25/13 2346 10/26/13 0420 10/27/13 0445  NA 136  < > 136 135 135 135 133* 133* 134*  K 3.5  < > 3.5 3.4* 3.9 3.7 3.8 3.5 3.9  CL 100  < > 99 97 98 95* 97 97 99  CO2 29  < > 30 29 29   --  29 28 28   GLUCOSE 124*  < > 113* 113* 112* 113* 112* 101* 114*  BUN 22  < > 18 20 18 18 20 18 18   CREATININE 1.24  < > 1.15 1.12 1.09 1.20 1.22 1.15 1.18  CALCIUM 8.0*  < > 7.9* 8.2* 8.0*  --  7.9* 7.9* 7.9*  MG 2.0  --  1.8 2.0 1.9  --  1.9 1.8  --   PHOS 3.3  --  2.0* 2.5 2.3  --   --  2.7  --   < > = values in this interval not displayed.  Liver Function Tests:  Recent Labs Lab 10/23/13 0410 10/24/13 0500 10/25/13 0442 10/26/13 0420 10/27/13 0445  AST 36 27 22 19  33  ALT 40 30 23 17 22   ALKPHOS 83 81 90 87 86  BILITOT 0.6 0.7 0.8 0.6 0.6  PROT 5.3* 5.3* 5.3* 4.9* 4.9*  ALBUMIN 2.9* 2.7* 2.5* 2.2* 2.1*   No results found for this basename: LIPASE, AMYLASE,  in the last 168 hours No results found for this basename: AMMONIA,  in the last 168 hours  CBC:  Recent Labs Lab 10/22/13 0445 10/23/13 0410 10/24/13 0500 10/25/13 0442 10/25/13 2320 10/26/13 0420 10/27/13 0445  WBC 9.9 8.3 8.4 9.8  --  10.0 7.7  NEUTROABS 8.0* 6.4 6.2 7.7  --  7.7  --   HGB 8.4* 8.4* 9.4* 9.4* 9.5* 9.1* 9.1*  HCT 24.6* 24.9*  28.3* 27.8* 28.0* 27.8* 27.2*  MCV 89.8 90.9 91.6 91.7  --  93.3 93.2  PLT 72* 77* 98* 124*  --  153 190    INR:  Recent Labs Lab 10/23/13 0410 10/24/13 0500 10/25/13 0442 10/26/13 0420 10/27/13 0445  INR 1.26 2.57* 3.11* 2.55* 2.52*   LDH 659 < 726>603>493>458>410  Other results:    Imaging: Dg Chest Port 1 View  10/26/2013   CLINICAL DATA:  Chest tubes.  Left ventricular assist device .  EXAM: PORTABLE CHEST - 1 VIEW  COMPARISON:  10/25/2013.  FINDINGS: Bilateral chest tubes again noted in stable position. A very miniscule left apical pneumothorax cannot be excluded. Cardiomegaly. Prior median sternotomy. Cardiac pacer noted with lead tips in right atrium. Left ventricular assist device noted. Persistent pulmonary interstitial prominence noted consistent with pulmonary interstitial edema. Interstitial edema may have increased slightly from prior exam.  IMPRESSION: 1. Stable line and tube positions. Left ventricular assist device noted. Bilateral chest tubes are noted. 2. Miniscule left apical pneumothorax cannot be completely excluded. 3. Mild progression of congestive heart failure and pulmonary interstitial edema. These results will be called to the ordering clinician or representative by the Radiologist Assistant, and communication documented in the PACS Dashboard.   Electronically Signed   By: Maisie Fus  Register   On: 10/26/2013 07:54     Medications:     Scheduled Medications: . amiodarone  400 mg Oral BID  . aspirin EC  325 mg Oral Daily   Or  . aspirin  324 mg Per Tube Daily   Or  . aspirin  300 mg Rectal Daily  . bisacodyl  10 mg Oral Daily   Or  . bisacodyl  10 mg Rectal Daily  . docusate sodium  200 mg Oral Daily  . feeding supplement (ENSURE COMPLETE)  237 mL Oral BID BM  . ferrous gluconate  324 mg Oral BID WC  . furosemide  40 mg Oral BID  . guaiFENesin  600 mg Oral BID  . insulin aspart  0-24 Units Subcutaneous Q4H  . metoprolol tartrate  25 mg Oral BID   . midodrine  2.5 mg Oral TID WC  . pantoprazole  40 mg Oral Daily  . Warfarin - Physician Dosing Inpatient   Does not apply q1800    Infusions: . sodium chloride 20 mL/hr at 10/22/13 0800  . sodium chloride 20 mL/hr at 10/27/13 0400  . sodium chloride 250 mL (  10/20/13 0600)  . amiodarone (NEXTERONE PREMIX) 360 mg/200 mL dextrose 30 mg/hr (10/24/13 1039)  . lactated ringers 20 mL/hr at 10/22/13 0800    PRN Medications: morphine injection, ondansetron (ZOFRAN) IV, oxyCODONE, sodium chloride   Assessment:   1) Chronic systolic HF, Class IV, EF 20-25%  --s/p HM II VAD implant 12/15  2) NICM/ICM  3) Recurrent Vtach  - ablation 09/2011 and 06/2013  4) Atrial flutter  - s/p DC-CV 10/15/2013  5) CAD with recanalized occluded RCA with left to right collat.  6) PAD: s/p aorto-bifem, bilateral renal artery bypass, bilateral fem-pop, and left CEA.      Plan/Discussion:    Continues to improve. Volume status looks quite good. With orthostasis will stop lasix for now. Continue TED hose and midodrine. Can increase midordine to 5 TID as needed.   AF rate improved. Continue amio and Metoprolol 25 bid.  Fever resolved. UA negative. Driveline site ok. Will follow. Remove Foley.  INR 2.52.   Continue to focus on ambulation. Possibly to floor tomorrow.  I reviewed the LVAD parameters from today, and compared the results to the patient's prior recorded data.  No programming changes were made.  The LVAD is functioning within specified parameters.  The patient performs LVAD self-test daily.  LVAD interrogation was negative for any significant power changes, alarms or PI events/speed drops.  LVAD equipment check completed and is in good working order.  Back-up equipment present.   LVAD education done on emergency procedures and precautions and reviewed exit site care.  Length of Stay: 9  Valori Hollenkamp MD 10/27/2013, 7:36 AM

## 2013-10-27 NOTE — Progress Notes (Signed)
Echocardiogram 2D Echocardiogram limited has been performed.  [name] 10/27/2013, 2:53 PM

## 2013-10-28 ENCOUNTER — Inpatient Hospital Stay (HOSPITAL_COMMUNITY): Payer: Medicare Other

## 2013-10-28 LAB — COMPREHENSIVE METABOLIC PANEL
ALT: 40 U/L (ref 0–53)
AST: 69 U/L — ABNORMAL HIGH (ref 0–37)
Albumin: 2.1 g/dL — ABNORMAL LOW (ref 3.5–5.2)
Alkaline Phosphatase: 99 U/L (ref 39–117)
BUN: 20 mg/dL (ref 6–23)
CO2: 27 mEq/L (ref 19–32)
Calcium: 8 mg/dL — ABNORMAL LOW (ref 8.4–10.5)
Chloride: 98 mEq/L (ref 96–112)
Creatinine, Ser: 1.15 mg/dL (ref 0.50–1.35)
GFR calc Af Amer: 71 mL/min — ABNORMAL LOW (ref >90)
GFR calc non Af Amer: 61 mL/min — ABNORMAL LOW (ref >90)
Glucose, Bld: 103 mg/dL — ABNORMAL HIGH (ref 70–99)
Potassium: 3.9 mEq/L (ref 3.5–5.1)
Sodium: 133 mEq/L — ABNORMAL LOW (ref 135–145)
Total Bilirubin: 0.7 mg/dL (ref 0.3–1.2)
Total Protein: 5.1 g/dL — ABNORMAL LOW (ref 6.0–8.3)

## 2013-10-28 LAB — CBC
HCT: 27.8 % — ABNORMAL LOW (ref 39.0–52.0)
Hemoglobin: 9 g/dL — ABNORMAL LOW (ref 13.0–17.0)
MCH: 30.3 pg (ref 26.0–34.0)
MCHC: 32.4 g/dL (ref 30.0–36.0)
MCV: 93.6 fL (ref 78.0–100.0)
Platelets: 224 10*3/uL (ref 150–400)
RBC: 2.97 MIL/uL — ABNORMAL LOW (ref 4.22–5.81)
WBC: 8.2 10*3/uL (ref 4.0–10.5)

## 2013-10-28 LAB — LACTATE DEHYDROGENASE: LDH: 414 U/L — ABNORMAL HIGH (ref 94–250)

## 2013-10-28 LAB — CARBOXYHEMOGLOBIN
Carboxyhemoglobin: 2.6 % — ABNORMAL HIGH (ref 0.5–1.5)
Methemoglobin: 0.5 % (ref 0.0–1.5)
O2 Saturation: 59.1 %
Total hemoglobin: 8 g/dL — ABNORMAL LOW (ref 13.5–18.0)

## 2013-10-28 LAB — PROTIME-INR
INR: 2.52 — ABNORMAL HIGH (ref 0.00–1.49)
Prothrombin Time: 26.3 seconds — ABNORMAL HIGH (ref 11.6–15.2)

## 2013-10-28 MED ORDER — WARFARIN SODIUM 2 MG PO TABS
2.0000 mg | ORAL_TABLET | Freq: Once | ORAL | Status: AC
  Administered 2013-10-28: 2 mg via ORAL
  Filled 2013-10-28: qty 1

## 2013-10-28 MED ORDER — MIDODRINE HCL 5 MG PO TABS
5.0000 mg | ORAL_TABLET | Freq: Three times a day (TID) | ORAL | Status: DC
  Administered 2013-10-28 – 2013-11-18 (×64): 5 mg via ORAL
  Filled 2013-10-28 (×71): qty 1

## 2013-10-28 MED ORDER — FUROSEMIDE 40 MG PO TABS
40.0000 mg | ORAL_TABLET | Freq: Every day | ORAL | Status: DC
  Administered 2013-10-28 – 2013-10-29 (×2): 40 mg via ORAL
  Filled 2013-10-28 (×3): qty 1

## 2013-10-28 MED ORDER — SODIUM CHLORIDE 0.9 % IJ SOLN
10.0000 mL | Freq: Two times a day (BID) | INTRAMUSCULAR | Status: DC
  Administered 2013-10-31: 10 mL

## 2013-10-28 MED ORDER — WARFARIN VIDEO
Freq: Once | Status: AC
  Administered 2013-10-28: 18:00:00

## 2013-10-28 MED ORDER — SODIUM CHLORIDE 0.9 % IJ SOLN
10.0000 mL | INTRAMUSCULAR | Status: DC | PRN
  Administered 2013-10-28: 20 mL
  Administered 2013-10-29 – 2013-11-02 (×5): 10 mL
  Administered 2013-11-03: 20 mL
  Administered 2013-11-06 – 2013-11-13 (×11): 10 mL
  Administered 2013-11-13: 20 mL
  Administered 2013-11-14 – 2013-11-18 (×5): 10 mL

## 2013-10-28 MED ORDER — POTASSIUM CHLORIDE CRYS ER 20 MEQ PO TBCR
20.0000 meq | EXTENDED_RELEASE_TABLET | Freq: Every day | ORAL | Status: DC
  Administered 2013-10-28 – 2013-10-29 (×2): 20 meq via ORAL
  Filled 2013-10-28 (×3): qty 1

## 2013-10-28 MED ORDER — PATIENT'S GUIDE TO USING COUMADIN BOOK
Freq: Once | Status: AC
  Administered 2013-10-28: 18:00:00
  Filled 2013-10-28: qty 1

## 2013-10-28 NOTE — Progress Notes (Signed)
I met with pt at bedside. He is progressing well with therapy at this time. He feels he and [name] are prepared to discharge home when medically ready. She plans to take FMLA for a few weeks and pt/ caregiver education is also going well per his perspective. I will follow up Monday to see his progress. He does not feel he will need an inpt rehab admission at this time. Please call me for any questions. [phone]

## 2013-10-28 NOTE — Progress Notes (Signed)
Physical Therapy Treatment Patient Details Name: [name] MRN: [id number] DOB: [birth year] Today's Date: 10/28/2013 Time: 0865-7846 PT Time Calculation (min): 32 min  PT Assessment / Plan / Recommendation  History of Present Illness Pt admit for LVAD placement.     PT Comments   Pt up in chair, smiling on arrival. Reports no further vertigo/spinning since cannalith repositioning treatment on 12/23. PI continues to drop when coming to standing (6.4 down to 3.1 and begins to rebound within 40 seconds). Dr. Gala Romney present and aware. While walking PI 4.7-5.3 and pt asymptomatic.    Follow Up Recommendations  Home health PT;Supervision/Assistance - 24 hour     Does the patient have the potential to tolerate intense rehabilitation     Barriers to Discharge        Equipment Recommendations  None recommended by PT    Recommendations for Other Services    Frequency Min 5X/week   Progress towards PT Goals Progress towards PT goals: Progressing toward goals  Plan Current plan remains appropriate    Precautions / Restrictions Precautions Precautions: Sternal;Fall Precaution Comments: LVAD; closely monitor PI as dropping when changing positions Required Braces or Orthoses: Other Brace/Splint Other Brace/Splint: strap for controller; vest for batteries; needs to take bag with back up supplies with him whenever he leaves his room Restrictions Weight Bearing Restrictions: No Other Position/Activity Restrictions: sternal precautions   Pertinent Vitals/Pain On 1L O2 with SaO2 unreadable while walking. Color good. HR 90-100s    Mobility  Bed Mobility Bed Mobility: Not assessed Transfers Transfers: Sit to Stand;Stand to Sit Sit to Stand: 1: +2 Total assist Sit to Stand: Patient Percentage: 50% Stand to Sit: 4: Min assist Details for Transfer Assistance: Pt fatigued. could not stand with +1 assist x 2 tries; on 3rd attempt with +2 was able to  stand Ambulation/Gait Ambulation/Gait Assistance: 4: Min assist Ambulation Distance (Feet): 200 Feet Assistive device: Other (Comment) (push w/c) Ambulation/Gait Assistance Details: more steady today compared to 12/23; cues to slow down, especially as PI slow to recover Gait Pattern: Within Functional Limits    Exercises Other Exercises Other Exercises: Pt required cues x2 for gathering all supplies to prepare for switching to batteries and going to walk (for battery clips and emergency bag); pt agreeable to using neck strap for controller to allow access to screen for monitoring PI numbers while up; pt required several vc throughout switch over for sequencing.   PT Diagnosis:    PT Problem List:   PT Treatment Interventions:     PT Goals (current goals can now be found in the care plan section)    Visit Information  Last PT Received On: 10/28/13 Assistance Needed: +2 History of Present Illness: Pt admit for LVAD placement.      Subjective Data      Cognition  Cognition Arousal/Alertness: Awake/alert Behavior During Therapy: WFL for tasks assessed/performed Overall Cognitive Status: Within Functional Limits for tasks assessed    Balance     End of Session PT - End of Session Equipment Utilized During Treatment: Oxygen Activity Tolerance: Patient tolerated treatment well Patient left: in chair;with call bell/phone within reach;with nursing/sitter in room Nurse Communication: Mobility status   GP     [name] 10/28/2013, 9:23 AM Pager [phone]

## 2013-10-28 NOTE — Progress Notes (Signed)
CARDIAC REHAB PHASE I   PRE:  Rate/Rhythm: 85 afib    BP: sitting 68 upon waking, 74 after donning batteries    SaO2: 93 1L  MODE:  Ambulation: 150 ft   POST:  Rate/Rhythm: 113 afib (no ectopy)    BP: sitting 72     SaO2: wouldn't register after walk (fingers cold)   Awoke pt. PI 2.5, increased to 4.0. Pt able to don batteries although delayed time to line up "half moons" on white cord. Requires increased energy to do whole process. With standing PI down to 2.5, up to 2.8-3.0 after 1 min. Asymptomatic. Pt walked with rollator and 2L O2. Followed with w/c although he didn't need to sit to rest. Made pt rest every 50 ft to check PI and conserve energy. Pt c/o increased SOB but no dizziness, etc. Tired after walk. PI maintained around 3.0 with walk. After walk with rest and then switching back to power module PI up to 7.0. Pt feels well, just tired. Left in recliner. Will f/u. 1320-1430  Elissa Lovett [place] CES, ACSM 10/28/2013 2:41 PM

## 2013-10-28 NOTE — Progress Notes (Signed)
Pt transferred via wheelchair to room 2W25, on monitor, with all LVAD equipment and belongings, including dentures, electric razor and cell phone.  Pt positioned comfortably in chair, on monitor, on 1L [place] with VSS.  Report given to Delice Bison, RN; pt left with RN in room.  Tolerated transfer well.

## 2013-10-28 NOTE — Progress Notes (Addendum)
Dressing changed; drive line site was pink and had minimal bloody drainage. During dressing change patient experienced brief episode of wide complex SVT; pt was asymptomatic; MAP 70; patients current HR at 94 Afib/paced rhythm; will continue to closely monitor patient.  BARNETT, Geroge Baseman

## 2013-10-28 NOTE — Progress Notes (Signed)
Patient ID: [name], male   DOB: [birth year], 72 y.o.   MRN: [id number]  HeartMate 2 Rounding Note  Subjective:    [name] is a 72 yo man with an ischemic CM s/p ICD implant, chronic systolic HF (EF 20-25%), paroxysmal VT s/p ablation (09/2011) and repeat (06/2013), CAD, PVD (s/p R fem pop BPG 01/2010; s/p L CFA BPG 05/2002), AAA (s/p repair 04/2002), CAS (s/p L CEA 2008), HTN, arthritis, and TIA. Recently admitted 12/8-12/11/14 for syncopal episode from inappropriate ATP which accelerated SVT>>VT with failed ATP and then spontaneous termination of VT. During hospital stay had repeat RHC, hemodynamics were stable and had TEE-DC-CV of AFL 10/15/13 which was successful. He was presented to our [facility] team and felt to be good LVAD candidate and then presented at [facility] transplant listing conference and they felt he was not a candidate for transplant, but good candidate for LVAD under DT criteria.   POD 9 HeartMate II LVAD  Doing well. Ambulating with PT. A bit dizzy on standing with PI drop from 6 -> 3.5. Able to continue  CVP 8-10. Weight up off lasix. Co-ox 59%, INR 2.5  MAPs stable 72-74. AF in 90s.   LVAD INTERROGATION:  HeartMate II LVAD:  Flow 3.9 liters/min, speed 8800, power 4.0, PI 6.5  Occasional  PI events throughout the day  Objective:    Vital Signs:   Temp:  [97.6 F (36.4 C)-98.6 F (37 C)] 97.6 F (36.4 C) (12/24 0752) Pulse Rate:  [35-132] 93 (12/24 0800) Resp:  [9-23] 21 (12/24 0800) SpO2:  [93 %-99 %] 99 % (12/24 0800) Weight:  [92 kg (202 lb 13.2 oz)] 92 kg (202 lb 13.2 oz) (12/24 0500) Last BM Date: 10/24/13  Intake/Output:   Intake/Output Summary (Last 24 hours) at 10/28/13 0900 Last data filed at 10/28/13 0800  Gross per 24 hour  Intake   1060 ml  Output    725 ml  Net    335 ml    Chest tube output 290 for 24 hrs.   Physical Exam: General: Looking brighter HEENT: normal  Neck: supple. JVP 8-10 . Carotids 2+ bilat; no bruits. No lymphadenopathy  or thryomegaly appreciated.  Cor: Faint heart sounds , LVAD hum present.  Lungs: clear  Abdomen: soft, nontender, nondistended. No hepatosplenomegaly. No bruits or masses. Active bowel sounds.  Driveline: C/D/I; securement device intact and driveline incorporated  Extremities: warm. Tr - 1+ edema+ Neuro: alert & orientedx3, cranial nerves grossly intact. moves all 4 extremities w/o difficulty. Affect pleasant    Telemetry: A-fib 90s  Labs: Basic Metabolic Panel:  Recent Labs Lab 10/22/13 0445  10/23/13 0410 10/24/13 0500 10/25/13 0442 10/25/13 2320 10/25/13 2346 10/26/13 0420 10/27/13 0445 10/28/13 0413  NA 136  < > 136 135 135 135 133* 133* 134* 133*  K 3.5  < > 3.5 3.4* 3.9 3.7 3.8 3.5 3.9 3.9  CL 100  < > 99 97 98 95* 97 97 99 98  CO2 29  < > 30 29 29   --  29 28 28 27   GLUCOSE 124*  < > 113* 113* 112* 113* 112* 101* 114* 103*  BUN 22  < > 18 20 18 18 20 18 18 20   CREATININE 1.24  < > 1.15 1.12 1.09 1.20 1.22 1.15 1.18 1.15  CALCIUM 8.0*  < > 7.9* 8.2* 8.0*  --  7.9* 7.9* 7.9* 8.0*  MG 2.0  --  1.8 2.0 1.9  --  1.9 1.8  --   --  PHOS 3.3  --  2.0* 2.5 2.3  --   --  2.7  --   --   < > = values in this interval not displayed.  Liver Function Tests:  Recent Labs Lab 10/24/13 0500 10/25/13 0442 10/26/13 0420 10/27/13 0445 10/28/13 0413  AST 27 22 19  33 69*  ALT 30 23 17 22  40  ALKPHOS 81 90 87 86 99  BILITOT 0.7 0.8 0.6 0.6 0.7  PROT 5.3* 5.3* 4.9* 4.9* 5.1*  ALBUMIN 2.7* 2.5* 2.2* 2.1* 2.1*   No results found for this basename: LIPASE, AMYLASE,  in the last 168 hours No results found for this basename: AMMONIA,  in the last 168 hours  CBC:  Recent Labs Lab 10/22/13 0445 10/23/13 0410 10/24/13 0500 10/25/13 0442 10/25/13 2320 10/26/13 0420 10/27/13 0445 10/28/13 0413  WBC 9.9 8.3 8.4 9.8  --  10.0 7.7 8.2  NEUTROABS 8.0* 6.4 6.2 7.7  --  7.7  --   --   HGB 8.4* 8.4* 9.4* 9.4* 9.5* 9.1* 9.1* 9.0*  HCT 24.6* 24.9* 28.3* 27.8* 28.0* 27.8* 27.2* 27.8*   MCV 89.8 90.9 91.6 91.7  --  93.3 93.2 93.6  PLT 72* 77* 98* 124*  --  153 190 224    INR:  Recent Labs Lab 10/24/13 0500 10/25/13 0442 10/26/13 0420 10/27/13 0445 10/28/13 0413  INR 2.57* 3.11* 2.55* 2.52* 2.52*   LDH 659 < 726>603>493>458>410  Other results:    Imaging: Dg Chest Port 1 View  10/28/2013   CLINICAL DATA:  Insertion of an implantable left ventricular assist device  EXAM: PORTABLE CHEST - 1 VIEW  COMPARISON:  10/27/2013  FINDINGS: The cardiac silhouette is enlarged. A left chest wall AICD unit is appreciated lead tips projecting in the region of the right atrium and right ventricle. Patient is status post median sternotomy. A right-sided central venous catheter is appreciated via subclavian approach with tip projecting in the region of the superior vena cava. A left ventricular assist device is appreciated unchanged in position. Atherosclerotic calcifications appreciated within the arch of the aorta. There is near complete resolution of the left apical pneumothorax. The patient's right chest tube has been removed. A trace right apical pneumothorax is identified. An area of increased density projects in the region of the lingula. There is blunting of the left costophrenic angle. Prominence of interstitial markings appreciated small is a component of peribronchial cuffing.  IMPRESSION: Near complete resolution of the left apical pneumothorax. The patient's right chest tube has been removed with a trace residual apical pneumothorax. Stable interstitial edema. Remaining support lines and devices as described above.   Electronically Signed   By: Salome Holmes M.D.   On: 10/28/2013 07:58   Dg Chest Port 1 View  10/27/2013   CLINICAL DATA:  Ventricular assist device placement  EXAM: PORTABLE CHEST - 1 VIEW  COMPARISON:  Yesterday  FINDINGS: Left chest tube removed. Trace left apical pneumothorax less than 1% improved right chest tube, left subclavian AICD device, right PICC,  ventricular assist device stable in position. Diffuse interstitial edema stable. No right pneumothorax. Cardiomegaly.  IMPRESSION: Left chest tube removed.  Trace left apical pneumothorax improved.  Stable interstitial edema.   Electronically Signed   By: Maryclare Bean M.D.   On: 10/27/2013 08:05     Medications:     Scheduled Medications: . amiodarone  400 mg Oral BID  . aspirin EC  325 mg Oral Daily  . bisacodyl  10 mg Oral Daily  Or  . bisacodyl  10 mg Rectal Daily  . docusate sodium  200 mg Oral Daily  . feeding supplement (ENSURE COMPLETE)  237 mL Oral BID BM  . ferrous gluconate  324 mg Oral BID WC  . furosemide  40 mg Oral Daily  . guaiFENesin  600 mg Oral BID  . metoprolol tartrate  25 mg Oral BID  . midodrine  2.5 mg Oral TID WC  . pantoprazole  40 mg Oral Daily  . potassium chloride  20 mEq Oral Daily  . Warfarin - Physician Dosing Inpatient   Does not apply q1800    Infusions: . sodium chloride 20 mL/hr at 10/22/13 0800  . sodium chloride Stopped (10/28/13 0800)  . sodium chloride 250 mL (10/20/13 0600)    PRN Medications: ondansetron (ZOFRAN) IV, oxyCODONE, sodium chloride, traMADol   Assessment:   1) Chronic systolic HF, Class IV, EF 20-25%  --s/p HM II VAD implant 12/15  2) NICM/ICM  3) Recurrent Vtach  - ablation 09/2011 and 06/2013  4) Atrial flutter  - s/p DC-CV 10/15/2013  5) CAD with recanalized occluded RCA with left to right collat.  6) PAD: s/p aorto-bifem, bilateral renal artery bypass, bilateral fem-pop, and left CEA.      Plan/Discussion:    Continues to improve. Agree with low-dose lasix. Will increase midordine to 5 TID.   AF rate improved. Continue amio and Metoprolol 25 bid.  Continue ambulation. Can move to floor  INR 2.52.   I reviewed the LVAD parameters from today, and compared the results to the patient's prior recorded data.  No programming changes were made.  The LVAD is functioning within specified parameters.  The patient  performs LVAD self-test daily.  LVAD interrogation was negative for any significant power changes, alarms or PI events/speed drops.  LVAD equipment check completed and is in good working order.  Back-up equipment present.   LVAD education done on emergency procedures and precautions and reviewed exit site care.  Length of Stay: 10  Arvilla Meres MD 10/28/2013, 9:00 AM

## 2013-10-28 NOTE — Progress Notes (Signed)
Patient ID: [name], male   DOB: 1941/04/26, 72 y.o.   MRN: [id number]  HeartMate 2 Rounding Note  Subjective:    [name] is a 72 yo man with an ischemic CM s/p ICD implant, chronic systolic HF (EF 20-25%), paroxysmal VT s/p ablation (09/2011) and repeat (06/2013), CAD, PVD (s/p R fem pop BPG 01/2010; s/p L CFA BPG 05/2002), AAA (s/p repair 04/2002), CAS (s/p L CEA 2008), HTN, arthritis, and TIA. Recently admitted 12/8-12/11/14 for syncopal episode from inappropriate ATP which accelerated SVT>>VT with failed ATP and then spontaneous termination of VT. During hospital stay had repeat RHC, hemodynamics were stable and had TEE-DC-CV of AFL 10/15/13 which was successful. He was presented to our [facility] team and felt to be good LVAD candidate and then presented at [facility] transplant listing conference and they felt he was not a candidate for transplant, but good candidate for LVAD under DT criteria.   POD 9 HeartMate II LVAD  He had a good day yesterday and a good night.  Tolerated getting out of bed and ambulating in the hall  in the am Walked 150 ft with PT -  dizziness and presyncope  are better Weight up 4 lbs 202 with normal CVP 10 cm H2O- will put back on po lasix Stable rate controlled afib w/o VT Echo shows no pericardial effusion , septum in midline, small LV, RV function slightly decreased off pressors on lopressor Has had a few PI events in last 24 hrs  No more VT with addition of lopressor to amiodarone, will cont po amio and stop iv drip Foley out voiding well  Co-ox this am 61 %, stable  His ICD has not shocked since VAD implant.  Nurse reports drive line yesterday looks ok with slight redness around it, minimal bloody drainage, no purulence No more fever, WBC normal  Off antibiotics . LVAD INTERROGATION:  HeartMate II LVAD:  Flow 4.1 liters/min, speed 8800, power 5.0, PI.4.5  No PI events recorded for 24 hrs  Objective:    Vital Signs:   Temp:  [97.8 F (36.6 C)-98.6  F (37 C)] 98 F (36.7 C) (12/24 0000) Pulse Rate:  [35-132] 89 (12/24 0600) Resp:  [9-23] 16 (12/24 0600) SpO2:  [93 %-99 %] 95 % (12/24 0600) Weight:  [202 lb 13.2 oz (92 kg)] 202 lb 13.2 oz (92 kg) (12/24 0500) Last BM Date: 10/24/13 Mean arterial Pressure 74 CVP 10  Intake/Output:   Intake/Output Summary (Last 24 hours) at 10/28/13 0710 Last data filed at 10/28/13 0600  Gross per 24 hour  Intake   1360 ml  Output    955 ml  Net    405 ml    Chest tube output 290 for 24 hrs.   Physical Exam: General: Awake and alert. Conversant HEENT: normal  Neck: supple. JVP normal.. No lymphadenopathy or thryomegaly appreciated.  Cor: Faint heart sounds , LVAD hum present.  Lungs: clear  Abdomen: soft, nontender, nondistended. No hepatosplenomegaly. No bruits or masses. Active bowel sounds. + bowel movement Driveline: C/D/I; securement device intact and driveline incorporated  Extremities: peripheral edema is decreasing - warm and well perfused Neuro: alert & orientedx3, cranial nerves grossly intact. moves all 4 extremities w/o difficulty. Affect pleasant  Surgical incisions all clean and dry  Telemetry: A-fib 92  Labs:  CXR with mild interstitial edema  Basic Metabolic Panel:  Recent Labs Lab 10/22/13 0445  10/23/13 0410 10/24/13 0500 10/25/13 0442 10/25/13 2320 10/25/13 2346 10/26/13 0420 10/27/13 0445 10/28/13  0413  NA 136  < > 136 135 135 135 133* 133* 134* 133*  K 3.5  < > 3.5 3.4* 3.9 3.7 3.8 3.5 3.9 3.9  CL 100  < > 99 97 98 95* 97 97 99 98  CO2 29  < > 30 29 29   --  29 28 28 27   GLUCOSE 124*  < > 113* 113* 112* 113* 112* 101* 114* 103*  BUN 22  < > 18 20 18 18 20 18 18 20   CREATININE 1.24  < > 1.15 1.12 1.09 1.20 1.22 1.15 1.18 1.15  CALCIUM 8.0*  < > 7.9* 8.2* 8.0*  --  7.9* 7.9* 7.9* 8.0*  MG 2.0  --  1.8 2.0 1.9  --  1.9 1.8  --   --   PHOS 3.3  --  2.0* 2.5 2.3  --   --  2.7  --   --   < > = values in this interval not displayed.  Liver Function  Tests:  Recent Labs Lab 10/24/13 0500 10/25/13 0442 10/26/13 0420 10/27/13 0445 10/28/13 0413  AST 27 22 19  33 69*  ALT 30 23 17 22  40  ALKPHOS 81 90 87 86 99  BILITOT 0.7 0.8 0.6 0.6 0.7  PROT 5.3* 5.3* 4.9* 4.9* 5.1*  ALBUMIN 2.7* 2.5* 2.2* 2.1* 2.1*   No results found for this basename: LIPASE, AMYLASE,  in the last 168 hours No results found for this basename: AMMONIA,  in the last 168 hours  CBC:  Recent Labs Lab 10/22/13 0445 10/23/13 0410 10/24/13 0500 10/25/13 0442 10/25/13 2320 10/26/13 0420 10/27/13 0445 10/28/13 0413  WBC 9.9 8.3 8.4 9.8  --  10.0 7.7 8.2  NEUTROABS 8.0* 6.4 6.2 7.7  --  7.7  --   --   HGB 8.4* 8.4* 9.4* 9.4* 9.5* 9.1* 9.1* 9.0*  HCT 24.6* 24.9* 28.3* 27.8* 28.0* 27.8* 27.2* 27.8*  MCV 89.8 90.9 91.6 91.7  --  93.3 93.2 93.6  PLT 72* 77* 98* 124*  --  153 190 224    INR:  Recent Labs Lab 10/24/13 0500 10/25/13 0442 10/26/13 0420 10/27/13 0445 10/28/13 0413  INR 2.57* 3.11* 2.55* 2.52* 2.52*   LDH 659 < 726>603>493>458>410  Other results:  EKG:   Imaging: Dg Chest Port 1 View  10/27/2013   CLINICAL DATA:  Ventricular assist device placement  EXAM: PORTABLE CHEST - 1 VIEW  COMPARISON:  Yesterday  FINDINGS: Left chest tube removed. Trace left apical pneumothorax less than 1% improved right chest tube, left subclavian AICD device, right PICC, ventricular assist device stable in position. Diffuse interstitial edema stable. No right pneumothorax. Cardiomegaly.  IMPRESSION: Left chest tube removed.  Trace left apical pneumothorax improved.  Stable interstitial edema.   Electronically Signed   By: Maryclare Bean M.D.   On: 10/27/2013 08:05     Medications:     Scheduled Medications: . amiodarone  400 mg Oral BID  . aspirin EC  325 mg Oral Daily   Or  . aspirin  324 mg Per Tube Daily   Or  . aspirin  300 mg Rectal Daily  . bisacodyl  10 mg Oral Daily   Or  . bisacodyl  10 mg Rectal Daily  . docusate sodium  200 mg Oral Daily   . feeding supplement (ENSURE COMPLETE)  237 mL Oral BID BM  . ferrous gluconate  324 mg Oral BID WC  . guaiFENesin  600 mg Oral BID  . metoprolol  tartrate  25 mg Oral BID  . midodrine  2.5 mg Oral TID WC  . pantoprazole  40 mg Oral Daily  . Warfarin - Physician Dosing Inpatient   Does not apply q1800    Infusions: . sodium chloride 20 mL/hr at 10/22/13 0800  . sodium chloride 20 mL/hr at 10/27/13 1155  . sodium chloride 250 mL (10/20/13 0600)  . lactated ringers 20 mL/hr at 10/22/13 0800    PRN Medications: ondansetron (ZOFRAN) IV, oxyCODONE, sodium chloride, traMADol   Assessment:   1) Chronic systolic HF, Class IV, EF 20-25%  --s/p HM II VAD implant 12/15  2) NICM/ICM  3) Recurrent Vtach  - ablation 09/2011 and 06/2013  4) Atrial flutter  - s/p DC-CV 10/15/2013  5) CAD with recanalized occluded RCA with left to right collat.  6) PAD: s/p aorto-bifem, bilateral renal artery bypass, bilateral fem-pop, and left CEA.      Plan/Discussion:    Took first walk in hallway yesterday- aim for 2 walks today  Acute blood loss anemia: stable Hgb, on po Fe  INR stable at 2.5 will redose coumadin 2 mg today  Patient now much stronger and getting up w/o orthostasis- ready to tx to stepdown    Cont VAD training, education.  I reviewed the LVAD parameters from today, and compared the results to the patient's prior recorded data.  No programming changes were made.  The LVAD is functioning within specified parameters.  The patient performs LVAD self-test daily.  LVAD interrogation was negative for any significant power changes, alarms or PI events/speed drops.  LVAD equipment check completed and is in good working order.  Back-up equipment present.   LVAD education done on emergency procedures and precautions and reviewed exit site care.  Length of Stay: 10  VAN TRIGT III,Jeremy Mclamb 10/28/2013, 7:10 AM

## 2013-10-28 NOTE — Progress Notes (Signed)
OT Cancellation Note  Patient Details Name: [name] MRN: [id number] DOB: August 18, 1941   Cancelled Treatment:    Reason Eval/Treat Not Completed: Other (comment) (Pt working with cardiac rehab).  Will reattempt, however, pt with complaint of feeling fatigued and has not yet begun cardiac rehab.    [name] [phone] 10/28/2013, 1:50 PM

## 2013-10-29 LAB — COMPREHENSIVE METABOLIC PANEL
ALT: 64 U/L — ABNORMAL HIGH (ref 0–53)
AST: 90 U/L — ABNORMAL HIGH (ref 0–37)
Albumin: 2.2 g/dL — ABNORMAL LOW (ref 3.5–5.2)
Alkaline Phosphatase: 105 U/L (ref 39–117)
BUN: 22 mg/dL (ref 6–23)
CO2: 25 mEq/L (ref 19–32)
Calcium: 8.2 mg/dL — ABNORMAL LOW (ref 8.4–10.5)
Chloride: 97 mEq/L (ref 96–112)
Creatinine, Ser: 1.2 mg/dL (ref 0.50–1.35)
GFR calc Af Amer: 67 mL/min — ABNORMAL LOW (ref >90)
GFR calc non Af Amer: 58 mL/min — ABNORMAL LOW (ref >90)
Glucose, Bld: 97 mg/dL (ref 70–99)
Potassium: 4 mEq/L (ref 3.5–5.1)
Sodium: 132 mEq/L — ABNORMAL LOW (ref 135–145)
Total Bilirubin: 0.7 mg/dL (ref 0.3–1.2)
Total Protein: 5.2 g/dL — ABNORMAL LOW (ref 6.0–8.3)

## 2013-10-29 LAB — CBC
HCT: 28.5 % — ABNORMAL LOW (ref 39.0–52.0)
MCH: 30.2 pg (ref 26.0–34.0)
MCHC: 32.3 g/dL (ref 30.0–36.0)
MCV: 93.4 fL (ref 78.0–100.0)
Platelets: 276 10*3/uL (ref 150–400)
RDW: 17.9 % — ABNORMAL HIGH (ref 11.5–15.5)
WBC: 9.7 10*3/uL (ref 4.0–10.5)

## 2013-10-29 LAB — PROTIME-INR: Prothrombin Time: 26.4 seconds — ABNORMAL HIGH (ref 11.6–15.2)

## 2013-10-29 MED ORDER — ALTEPLASE 2 MG IJ SOLR
2.0000 mg | Freq: Once | INTRAMUSCULAR | Status: AC
  Administered 2013-10-29: 2 mg
  Filled 2013-10-29: qty 2

## 2013-10-29 MED ORDER — WARFARIN SODIUM 2 MG PO TABS
2.0000 mg | ORAL_TABLET | Freq: Once | ORAL | Status: AC
  Administered 2013-10-29: 2 mg via ORAL
  Filled 2013-10-29: qty 1

## 2013-10-29 NOTE — Progress Notes (Signed)
Patient ID: JAMA KRICHBAUM, male   DOB: [birth year], 72 y.o.   MRN: [id number]  HeartMate 2 Rounding Note  Subjective:    [name] is a 72 yo man with an ischemic CM s/p ICD implant, chronic systolic HF (EF 20-25%), paroxysmal VT s/p ablation (09/2011) and repeat (06/2013), CAD, PVD (s/p R fem pop BPG 01/2010; s/p L CFA BPG 05/2002), AAA (s/p repair 04/2002), CAS (s/p L CEA 2008), HTN, arthritis, and TIA. Recently admitted 12/8-12/11/14 for syncopal episode from inappropriate ATP which accelerated SVT>>VT with failed ATP and then spontaneous termination of VT. During hospital stay had repeat RHC, hemodynamics were stable and had TEE-DC-CV of AFL 10/15/13 which was successful. He was presented to our [facility] team and felt to be good LVAD candidate and then presented at [facility] transplant listing conference and they felt he was not a candidate for transplant, but good candidate for LVAD under DT criteria.   POD 10 HeartMate II LVAD  Improving everyday. "I am at 75%!" Orthostasis improved with increase in midodrine. Denies SOB, orthopnea or PND. Walking with nursing staff and PT.  On po lasix now. Weight stable.   MAPs stable 70s. Remains AF in 80-90s. INR 2.5 no co-ox.    LVAD INTERROGATION:  HeartMate II LVAD:  Flow 4.3- liters/min, speed 8800, power 5.0, PI 6.8  Occasional  PI events throughout the day  Objective:    Vital Signs:   Temp:  [97.1 F (36.2 C)-98.7 F (37.1 C)] 97.4 F (36.3 C) (12/25 7829) Pulse Rate:  [75-92] 78 (12/25 1103) BP: (76)/(1) 76/1 mmHg (12/25 1103) SpO2:  [92 %-95 %] 93 % (12/25 1103) Weight:  [91.7 kg (202 lb 2.6 oz)] 91.7 kg (202 lb 2.6 oz) (12/25 0616) Last BM Date: 10/24/13  Intake/Output:   Intake/Output Summary (Last 24 hours) at 10/29/13 1243 Last data filed at 10/29/13 0839  Gross per 24 hour  Intake    720 ml  Output   1300 ml  Net   -580 ml     Physical Exam: General: Looking brighter HEENT: normal  Neck: supple. JVP 8-10 . Carotids 2+  bilat; no bruits. No lymphadenopathy or thryomegaly appreciated.  Cor: Faint heart sounds , LVAD hum present.  Lungs: clear  Abdomen: soft, nontender, nondistended. No hepatosplenomegaly. No bruits or masses. Active bowel sounds.  Driveline: C/D/I; securement device intact and driveline incorporated  Extremities: warm. Tr - 1+ edema+ Neuro: alert & orientedx3, cranial nerves grossly intact. moves all 4 extremities w/o difficulty. Affect pleasant    Telemetry: A-fib 90s  Labs: Basic Metabolic Panel:  Recent Labs Lab 10/23/13 0410 10/24/13 0500 10/25/13 0442  10/25/13 2346 10/26/13 0420 10/27/13 0445 10/28/13 0413 10/29/13 0515  NA 136 135 135  < > 133* 133* 134* 133* 132*  K 3.5 3.4* 3.9  < > 3.8 3.5 3.9 3.9 4.0  CL 99 97 98  < > 97 97 99 98 97  CO2 30 29 29   --  29 28 28 27 25   GLUCOSE 113* 113* 112*  < > 112* 101* 114* 103* 97  BUN 18 20 18   < > 20 18 18 20 22   CREATININE 1.15 1.12 1.09  < > 1.22 1.15 1.18 1.15 1.20  CALCIUM 7.9* 8.2* 8.0*  --  7.9* 7.9* 7.9* 8.0* 8.2*  MG 1.8 2.0 1.9  --  1.9 1.8  --   --   --   PHOS 2.0* 2.5 2.3  --   --  2.7  --   --   --   < > =  values in this interval not displayed.  Liver Function Tests:  Recent Labs Lab 10/25/13 0442 10/26/13 0420 10/27/13 0445 10/28/13 0413 10/29/13 0515  AST 22 19 33 69* 90*  ALT 23 17 22  40 64*  ALKPHOS 90 87 86 99 105  BILITOT 0.8 0.6 0.6 0.7 0.7  PROT 5.3* 4.9* 4.9* 5.1* 5.2*  ALBUMIN 2.5* 2.2* 2.1* 2.1* 2.2*   No results found for this basename: LIPASE, AMYLASE,  in the last 168 hours No results found for this basename: AMMONIA,  in the last 168 hours  CBC:  Recent Labs Lab 10/23/13 0410 10/24/13 0500 10/25/13 0442 10/25/13 2320 10/26/13 0420 10/27/13 0445 10/28/13 0413 10/29/13 0515  WBC 8.3 8.4 9.8  --  10.0 7.7 8.2 9.7  NEUTROABS 6.4 6.2 7.7  --  7.7  --   --   --   HGB 8.4* 9.4* 9.4* 9.5* 9.1* 9.1* 9.0* 9.2*  HCT 24.9* 28.3* 27.8* 28.0* 27.8* 27.2* 27.8* 28.5*  MCV 90.9 91.6  91.7  --  93.3 93.2 93.6 93.4  PLT 77* 98* 124*  --  153 190 224 276    INR:  Recent Labs Lab 10/25/13 0442 10/26/13 0420 10/27/13 0445 10/28/13 0413 10/29/13 0515  INR 3.11* 2.55* 2.52* 2.52* 2.53*   LDH 659 < 726>603>493>458>410  Other results:    Imaging: Dg Chest 2 View  10/28/2013   CLINICAL DATA:  Shortness of Breath  EXAM: CHEST  2 VIEW  COMPARISON:  10/28/2013  FINDINGS: Cardiomediastinal silhouette is stable. Status post median sternotomy. Dual lead cardiac pacemaker is unchanged in position. Left ventricular assisting device is unchanged in position. No acute infiltrate or pulmonary edema. Scattered stable atelectasis or scarring in the lingula. Small left pleural effusion with left basilar atelectasis.  IMPRESSION: Status post median sternotomy. Dual lead cardiac pacemaker is unchanged in position. Left ventricular assisting device is unchanged in position. No acute infiltrate or pulmonary edema. Scattered stable atelectasis or scarring in the lingula. Small left pleural effusion with left basilar atelectasis.   Electronically Signed   By: Natasha Mead M.D.   On: 10/28/2013 10:08   Dg Chest Port 1 View  10/28/2013   CLINICAL DATA:  Insertion of an implantable left ventricular assist device  EXAM: PORTABLE CHEST - 1 VIEW  COMPARISON:  10/27/2013  FINDINGS: The cardiac silhouette is enlarged. A left chest wall AICD unit is appreciated lead tips projecting in the region of the right atrium and right ventricle. Patient is status post median sternotomy. A right-sided central venous catheter is appreciated via subclavian approach with tip projecting in the region of the superior vena cava. A left ventricular assist device is appreciated unchanged in position. Atherosclerotic calcifications appreciated within the arch of the aorta. There is near complete resolution of the left apical pneumothorax. The patient's right chest tube has been removed. A trace right apical pneumothorax is  identified. An area of increased density projects in the region of the lingula. There is blunting of the left costophrenic angle. Prominence of interstitial markings appreciated small is a component of peribronchial cuffing.  IMPRESSION: Near complete resolution of the left apical pneumothorax. The patient's right chest tube has been removed with a trace residual apical pneumothorax. Stable interstitial edema. Remaining support lines and devices as described above.   Electronically Signed   By: Salome Holmes M.D.   On: 10/28/2013 07:58     Medications:     Scheduled Medications: . amiodarone  400 mg Oral BID  . aspirin EC  325 mg Oral Daily  . bisacodyl  10 mg Oral Daily   Or  . bisacodyl  10 mg Rectal Daily  . docusate sodium  200 mg Oral Daily  . feeding supplement (ENSURE COMPLETE)  237 mL Oral BID BM  . ferrous gluconate  324 mg Oral BID WC  . furosemide  40 mg Oral Daily  . guaiFENesin  600 mg Oral BID  . metoprolol tartrate  25 mg Oral BID  . midodrine  5 mg Oral TID WC  . pantoprazole  40 mg Oral Daily  . potassium chloride  20 mEq Oral Daily  . sodium chloride  10-40 mL Intracatheter Q12H  . warfarin  2 mg Oral ONCE-1800  . Warfarin - Physician Dosing Inpatient   Does not apply q1800    Infusions: . sodium chloride 20 mL/hr at 10/22/13 0800  . sodium chloride Stopped (10/28/13 0800)  . sodium chloride 250 mL (10/20/13 0600)    PRN Medications: ondansetron (ZOFRAN) IV, oxyCODONE, sodium chloride, sodium chloride, traMADol   Assessment:   1) Chronic systolic HF, Class IV, EF 20-25%  --s/p HM II VAD implant 12/15  2) NICM/ICM  3) Recurrent Vtach  - ablation 09/2011 and 06/2013  4) Atrial flutter  - s/p DC-CV 10/15/2013  5) CAD with recanalized occluded RCA with left to right collat.  6) PAD: s/p aorto-bifem, bilateral renal artery bypass, bilateral fem-pop, and left CEA.      Plan/Discussion:    Continues to improve. Continue current regimen. Weight still up  a bit from baseline but wouldn't push diuresis too hard with orthostasis.   Continue ambulation and education. Potentially home on Monday?   I reviewed the LVAD parameters from today, and compared the results to the patient's prior recorded data.  No programming changes were made.  The LVAD is functioning within specified parameters.  The patient performs LVAD self-test daily.  LVAD interrogation was negative for any significant power changes, alarms or PI events/speed drops.  LVAD equipment check completed and is in good working order.  Back-up equipment present.   LVAD education done on emergency procedures and precautions and reviewed exit site care.  Length of Stay: 10  Arvilla Meres MD 10/29/2013, 12:43 PM

## 2013-10-29 NOTE — Progress Notes (Signed)
Patient on 1L nasal cannula. Denies shortness of breath at this time. Request to take O2 off. O2 sat 96%. O2 removed; will re-evaluate.[name]

## 2013-10-29 NOTE — Progress Notes (Signed)
Patient ID: Frank HORRIGAN, male   DOB: [birth year], 72 y.o.   MRN: [id number]  HeartMate 2 Rounding Note  Subjective:    [name] is a 72 yo man with an ischemic CM s/p ICD implant, chronic systolic HF (EF 20-25%), paroxysmal VT s/p ablation (09/2011) and repeat (06/2013), CAD, PVD (s/p R fem pop BPG 01/2010; s/p L CFA BPG 05/2002), AAA (s/p repair 04/2002), CAS (s/p L CEA 2008), HTN, arthritis, and TIA. Recently admitted 12/8-12/11/14 for syncopal episode from inappropriate ATP which accelerated SVT>>VT with failed ATP and then spontaneous termination of VT. During hospital stay had repeat RHC, hemodynamics were stable and had TEE-DC-CV of AFL 10/15/13 which was successful. He was presented to our [facility] team and felt to be good LVAD candidate and then presented at [facility] transplant listing conference and they felt he was not a candidate for transplant, but good candidate for LVAD under DT criteria.   POD 10 HeartMate II LVAD  He had a good day yesterday and a good night.  Tolerated getting out of bed and ambulating in the hall  in the am Walked 150 ft with PT -  dizziness and presyncope  are better Weight up 4 lbs 202 with normal CVP 10 cm H2O-  patient back on po lasix 40 per day Stable rate controlled afib w/o VT Echo shows no pericardial effusion , septum in midline, small LV, RV function slightly decreased off pressors on lopressor Has had a few PI events in last 24 hrs - at 3 am No more VT with addition of lopressor to amiodarone, will cont po amio and stop iv drip Foley out voiding well  Co-ox this am 61 %, stable  His ICD has not shocked since VAD implant.  Nurse reports drive line yesterday looks ok with slight redness around it, minimal bloody drainage, no purulence No more fever, WBC normal  Off antibiotics . LVAD INTERROGATION:  HeartMate II LVAD:  Flow 4.5 liters/min, speed 8800, power 5.0, PI.5.8  Two   PI events recorded for 24 hrs  Objective:    Vital Signs:   Temp:   [97.1 F (36.2 C)-98.7 F (37.1 C)] 97.4 F (36.3 C) (12/25 4098) Pulse Rate:  [75-92] 78 (12/25 1103) BP: (76)/(1) 76/1 mmHg (12/25 1103) SpO2:  [92 %-95 %] 93 % (12/25 1103) Weight:  [202 lb 2.6 oz (91.7 kg)] 202 lb 2.6 oz (91.7 kg) (12/25 0616) Last BM Date: 10/24/13 Mean arterial Pressure 74 CVP 10 12-24  Intake/Output:   Intake/Output Summary (Last 24 hours) at 10/29/13 1220 Last data filed at 10/29/13 0839  Gross per 24 hour  Intake    720 ml  Output   1300 ml  Net   -580 ml    Chest tube output 290 for 24 hrs.   Physical Exam: General: Awake and alert. Conversant HEENT: normal  Neck: supple. JVP normal.. No lymphadenopathy or thryomegaly appreciated.  Cor: Faint heart sounds , LVAD hum present.  Lungs: clear  Abdomen: soft, nontender, nondistended. No hepatosplenomegaly. No bruits or masses. Active bowel sounds. + bowel movement Driveline: C/D/I; securement device intact and driveline incorporated  Extremities: peripheral edema is decreasing - warm and well perfused Neuro: alert & orientedx3, cranial nerves grossly intact. moves all 4 extremities w/o difficulty. Affect pleasant  Surgical incisions all clean and dry  Telemetry: A-fib 92  Labs:  CXR with mild interstitial edema- pump orientation excellent w/ inflow cannula directed at mitral valve  Basic Metabolic Panel:  Recent Labs Lab  10/23/13 0410 10/24/13 0500 10/25/13 0442  10/25/13 2346 10/26/13 0420 10/27/13 0445 10/28/13 0413 10/29/13 0515  NA 136 135 135  < > 133* 133* 134* 133* 132*  K 3.5 3.4* 3.9  < > 3.8 3.5 3.9 3.9 4.0  CL 99 97 98  < > 97 97 99 98 97  CO2 30 29 29   --  29 28 28 27 25   GLUCOSE 113* 113* 112*  < > 112* 101* 114* 103* 97  BUN 18 20 18   < > 20 18 18 20 22   CREATININE 1.15 1.12 1.09  < > 1.22 1.15 1.18 1.15 1.20  CALCIUM 7.9* 8.2* 8.0*  --  7.9* 7.9* 7.9* 8.0* 8.2*  MG 1.8 2.0 1.9  --  1.9 1.8  --   --   --   PHOS 2.0* 2.5 2.3  --   --  2.7  --   --   --   < > = values in  this interval not displayed.  Liver Function Tests:  Recent Labs Lab 10/25/13 0442 10/26/13 0420 10/27/13 0445 10/28/13 0413 10/29/13 0515  AST 22 19 33 69* 90*  ALT 23 17 22  40 64*  ALKPHOS 90 87 86 99 105  BILITOT 0.8 0.6 0.6 0.7 0.7  PROT 5.3* 4.9* 4.9* 5.1* 5.2*  ALBUMIN 2.5* 2.2* 2.1* 2.1* 2.2*   No results found for this basename: LIPASE, AMYLASE,  in the last 168 hours No results found for this basename: AMMONIA,  in the last 168 hours  CBC:  Recent Labs Lab 10/23/13 0410 10/24/13 0500 10/25/13 0442 10/25/13 2320 10/26/13 0420 10/27/13 0445 10/28/13 0413 10/29/13 0515  WBC 8.3 8.4 9.8  --  10.0 7.7 8.2 9.7  NEUTROABS 6.4 6.2 7.7  --  7.7  --   --   --   HGB 8.4* 9.4* 9.4* 9.5* 9.1* 9.1* 9.0* 9.2*  HCT 24.9* 28.3* 27.8* 28.0* 27.8* 27.2* 27.8* 28.5*  MCV 90.9 91.6 91.7  --  93.3 93.2 93.6 93.4  PLT 77* 98* 124*  --  153 190 224 276    INR:  Recent Labs Lab 10/25/13 0442 10/26/13 0420 10/27/13 0445 10/28/13 0413 10/29/13 0515  INR 3.11* 2.55* 2.52* 2.52* 2.53*   LDH 659 < 726>603>493>458>410  Other results:  EKG:   Imaging: Dg Chest 2 View  10/28/2013   CLINICAL DATA:  Shortness of Breath  EXAM: CHEST  2 VIEW  COMPARISON:  10/28/2013  FINDINGS: Cardiomediastinal silhouette is stable. Status post median sternotomy. Dual lead cardiac pacemaker is unchanged in position. Left ventricular assisting device is unchanged in position. No acute infiltrate or pulmonary edema. Scattered stable atelectasis or scarring in the lingula. Small left pleural effusion with left basilar atelectasis.  IMPRESSION: Status post median sternotomy. Dual lead cardiac pacemaker is unchanged in position. Left ventricular assisting device is unchanged in position. No acute infiltrate or pulmonary edema. Scattered stable atelectasis or scarring in the lingula. Small left pleural effusion with left basilar atelectasis.   Electronically Signed   By: Natasha Mead M.D.   On: 10/28/2013  10:08   Dg Chest Port 1 View  10/28/2013   CLINICAL DATA:  Insertion of an implantable left ventricular assist device  EXAM: PORTABLE CHEST - 1 VIEW  COMPARISON:  10/27/2013  FINDINGS: The cardiac silhouette is enlarged. A left chest wall AICD unit is appreciated lead tips projecting in the region of the right atrium and right ventricle. Patient is status post median sternotomy. A right-sided central venous  catheter is appreciated via subclavian approach with tip projecting in the region of the superior vena cava. A left ventricular assist device is appreciated unchanged in position. Atherosclerotic calcifications appreciated within the arch of the aorta. There is near complete resolution of the left apical pneumothorax. The patient's right chest tube has been removed. A trace right apical pneumothorax is identified. An area of increased density projects in the region of the lingula. There is blunting of the left costophrenic angle. Prominence of interstitial markings appreciated small is a component of peribronchial cuffing.  IMPRESSION: Near complete resolution of the left apical pneumothorax. The patient's right chest tube has been removed with a trace residual apical pneumothorax. Stable interstitial edema. Remaining support lines and devices as described above.   Electronically Signed   By: Salome Holmes M.D.   On: 10/28/2013 07:58     Medications:     Scheduled Medications: . alteplase  2 mg Intracatheter Once  . amiodarone  400 mg Oral BID  . aspirin EC  325 mg Oral Daily  . bisacodyl  10 mg Oral Daily   Or  . bisacodyl  10 mg Rectal Daily  . docusate sodium  200 mg Oral Daily  . feeding supplement (ENSURE COMPLETE)  237 mL Oral BID BM  . ferrous gluconate  324 mg Oral BID WC  . furosemide  40 mg Oral Daily  . guaiFENesin  600 mg Oral BID  . metoprolol tartrate  25 mg Oral BID  . midodrine  5 mg Oral TID WC  . pantoprazole  40 mg Oral Daily  . potassium chloride  20 mEq Oral Daily   . sodium chloride  10-40 mL Intracatheter Q12H  . warfarin  2 mg Oral ONCE-1800  . Warfarin - Physician Dosing Inpatient   Does not apply q1800    Infusions: . sodium chloride 20 mL/hr at 10/22/13 0800  . sodium chloride Stopped (10/28/13 0800)  . sodium chloride 250 mL (10/20/13 0600)    PRN Medications: ondansetron (ZOFRAN) IV, oxyCODONE, sodium chloride, sodium chloride, traMADol   Assessment:   1) Chronic systolic HF, Class IV, EF 20-25%  --s/p HM II VAD implant 12/15  2) NICM/ICM  3) Recurrent Vtach  - ablation 09/2011 and 06/2013  4) Atrial flutter  - s/p DC-CV 10/15/2013  5) CAD with recanalized occluded RCA with left to right collat.  6) PAD: s/p aorto-bifem, bilateral renal artery bypass, bilateral fem-pop, and left CEA.      Plan/Discussion:    Overall strength and ambulation  improving- aim for 2 walks today  Acute blood loss anemia: stable Hgb, on po Fe  INR stable at 2.5 will redose coumadin 2 mg today  Patient now much stronger and getting up w/o orthostasis- doing well on stepdown    Cont VAD training, education.Cont PT  I reviewed the LVAD parameters from today, and compared the results to the patient's prior recorded data.  No programming changes were made.  The LVAD is functioning within specified parameters.  The patient performs LVAD self-test daily.  LVAD interrogation was negative for any significant power changes, alarms or PI events/speed drops.  LVAD equipment check completed and is in good working order.  Back-up equipment present.   LVAD education done on emergency procedures and precautions and reviewed exit site care.  Length of Stay: 11  VAN TRIGT III,Weldon Jon 10/29/2013, 12:20 PM

## 2013-10-29 NOTE — Progress Notes (Signed)
O2 sat 93% on room air. Remaining VSS. Medications given. Patient remains in chair. Agrees to ambulate with RN after lunch. Call bell near. Will monitor.Mamie Levers

## 2013-10-29 NOTE — Progress Notes (Signed)
Ambulated 150 ft on 2L O2 using walker. Stopped x3 d/t shortness of breath. Hr 122. Returned to bedside chair in room. Patient had brief 6 beat run VT. LVAD readings as follows during this time: PF 3.7 PS 8800 PI 6.7 PP 4.4. Returned back to A-fib 98-101. Remains on 2L O2 at this time. Will continue to monitor.

## 2013-10-30 ENCOUNTER — Inpatient Hospital Stay (HOSPITAL_COMMUNITY): Payer: Medicare Other

## 2013-10-30 LAB — CBC
Hemoglobin: 9.5 g/dL — ABNORMAL LOW (ref 13.0–17.0)
MCH: 30.5 pg (ref 26.0–34.0)
Platelets: 298 10*3/uL (ref 150–400)
RBC: 3.11 MIL/uL — ABNORMAL LOW (ref 4.22–5.81)

## 2013-10-30 LAB — COMPREHENSIVE METABOLIC PANEL
ALT: 76 U/L — ABNORMAL HIGH (ref 0–53)
AST: 85 U/L — ABNORMAL HIGH (ref 0–37)
Albumin: 2.3 g/dL — ABNORMAL LOW (ref 3.5–5.2)
Alkaline Phosphatase: 118 U/L — ABNORMAL HIGH (ref 39–117)
BUN: 19 mg/dL (ref 6–23)
CO2: 28 mEq/L (ref 19–32)
Calcium: 8.8 mg/dL (ref 8.4–10.5)
Chloride: 98 mEq/L (ref 96–112)
Creatinine, Ser: 1.26 mg/dL (ref 0.50–1.35)
GFR calc Af Amer: 63 mL/min — ABNORMAL LOW (ref >90)
GFR calc non Af Amer: 54 mL/min — ABNORMAL LOW (ref >90)
Glucose, Bld: 106 mg/dL — ABNORMAL HIGH (ref 70–99)
Potassium: 3.9 mEq/L (ref 3.5–5.1)
Sodium: 134 mEq/L — ABNORMAL LOW (ref 135–145)
Total Bilirubin: 0.8 mg/dL (ref 0.3–1.2)
Total Protein: 5.4 g/dL — ABNORMAL LOW (ref 6.0–8.3)

## 2013-10-30 LAB — CARBOXYHEMOGLOBIN
Methemoglobin: 1.4 % (ref 0.0–1.5)
O2 Saturation: 68.1 %
Total hemoglobin: 10.5 g/dL — ABNORMAL LOW (ref 13.5–18.0)

## 2013-10-30 LAB — PROTIME-INR: INR: 2.57 — ABNORMAL HIGH (ref 0.00–1.49)

## 2013-10-30 LAB — LACTATE DEHYDROGENASE: LDH: 417 U/L — ABNORMAL HIGH (ref 94–250)

## 2013-10-30 MED ORDER — AMIODARONE HCL 200 MG PO TABS
200.0000 mg | ORAL_TABLET | Freq: Two times a day (BID) | ORAL | Status: DC
  Administered 2013-10-30 – 2013-10-31 (×2): 200 mg via ORAL
  Filled 2013-10-30 (×3): qty 1

## 2013-10-30 MED ORDER — WARFARIN SODIUM 2 MG PO TABS
2.0000 mg | ORAL_TABLET | Freq: Once | ORAL | Status: AC
  Administered 2013-10-30: 2 mg via ORAL
  Filled 2013-10-30: qty 1

## 2013-10-30 MED ORDER — PIPERACILLIN-TAZOBACTAM 3.375 G IVPB
3.3750 g | Freq: Three times a day (TID) | INTRAVENOUS | Status: DC
  Administered 2013-10-30 – 2013-11-01 (×5): 3.375 g via INTRAVENOUS
  Filled 2013-10-30 (×7): qty 50

## 2013-10-30 MED ORDER — VANCOMYCIN HCL IN DEXTROSE 750-5 MG/150ML-% IV SOLN
750.0000 mg | Freq: Two times a day (BID) | INTRAVENOUS | Status: DC
  Administered 2013-10-31 – 2013-11-02 (×5): 750 mg via INTRAVENOUS
  Filled 2013-10-30 (×6): qty 150

## 2013-10-30 MED ORDER — POTASSIUM CHLORIDE CRYS ER 20 MEQ PO TBCR
20.0000 meq | EXTENDED_RELEASE_TABLET | Freq: Two times a day (BID) | ORAL | Status: DC
  Administered 2013-10-30 (×2): 20 meq via ORAL
  Filled 2013-10-30 (×3): qty 1

## 2013-10-30 MED ORDER — TORSEMIDE 20 MG PO TABS
20.0000 mg | ORAL_TABLET | Freq: Two times a day (BID) | ORAL | Status: DC
  Administered 2013-10-30 – 2013-10-31 (×2): 20 mg via ORAL
  Filled 2013-10-30 (×4): qty 1

## 2013-10-30 MED ORDER — VANCOMYCIN HCL IN DEXTROSE 1-5 GM/200ML-% IV SOLN
1000.0000 mg | Freq: Once | INTRAVENOUS | Status: AC
  Administered 2013-10-30: 1000 mg via INTRAVENOUS
  Filled 2013-10-30: qty 200

## 2013-10-30 MED ORDER — FUROSEMIDE 40 MG PO TABS
40.0000 mg | ORAL_TABLET | Freq: Two times a day (BID) | ORAL | Status: DC
  Filled 2013-10-30 (×2): qty 1

## 2013-10-30 NOTE — Progress Notes (Signed)
Patient vomited 100cc clear mucus emesis. Patient having more frequent runs of wide complex tachycardia. MAP 82; see LVAD assessment for further readings. Zofran IV given; IVT notified to flush PICC. Molly to see patient. Will evaluate and monitor.Mamie Levers

## 2013-10-30 NOTE — Progress Notes (Signed)
Patient ID: DELNO BLAISDELL, male   DOB: Jan 03, 1941, 72 y.o.   MRN: [id number]  HeartMate 2 Rounding Note  Subjective:    [name] is a 72 yo man with an ischemic CM s/p ICD implant, chronic systolic HF (EF 20-25%), paroxysmal VT s/p ablation (09/2011) and repeat (06/2013), CAD, PVD (s/p R fem pop BPG 01/2010; s/p L CFA BPG 05/2002), AAA (s/p repair 04/2002), CAS (s/p L CEA 2008), HTN, arthritis, and TIA. Recently admitted 12/8-12/11/14 for syncopal episode from inappropriate ATP which accelerated SVT>>VT with failed ATP and then spontaneous termination of VT. During hospital stay had repeat RHC, hemodynamics were stable and had TEE-DC-CV of AFL 10/15/13 which was successful. He was presented to our [facility] team and felt to be good LVAD candidate and then presented at [facility] transplant listing conference and they felt he was not a candidate for transplant, but good candidate for LVAD under DT criteria.   POD 111 HeartMate II LVAD  He had a good day yesterday and a good night He ambulated 150 feet but was more SOB than the previous day His wt. is up another pound and his ankle edema is more pronounced this am.  He was restarted on lasix but may respond better to demedex  Stable rate controlled afib w/o VT Echo shows no pericardial effusion , septum in midline, small LV, RV function slightly decreased off pressors on lopressor Has had a few PI events in last 24 hrs - at 3 am No more VT with addition of lopressor to amiodarone, will cont po amio and stop iv drip Foley out voiding well  Co-ox this am 61 %, stable  His ICD has not shocked since VAD implant.  Nurse reports drive line yesterday looks ok with slight redness around it, minimal bloody drainage, no purulence No more fever, WBC 11k  Off antibiotics . LVAD INTERROGATION:  HeartMate II LVAD:  Flow 4.2 liters/min, speed 8800, power 5.0, PI.4.8  Three   PI events recorded for 24 hrs  Objective:    Vital Signs:   Temp:  [97.6 F (36.4  C)-98.9 F (37.2 C)] 98.6 F (37 C) (12/26 0454) Pulse Rate:  [78-94] 94 (12/26 0610) Resp:  [12-16] 12 (12/26 0454) BP: (76)/(1) 76/1 mmHg (12/25 1103) SpO2:  [92 %-99 %] 94 % (12/26 0454) Weight:  [203 lb 8 oz (92.307 kg)] 203 lb 8 oz (92.307 kg) (12/26 0454) Last BM Date: 10/24/13 Mean arterial Pressure 74 CVP 10 12-24  Intake/Output:   Intake/Output Summary (Last 24 hours) at 10/30/13 0841 Last data filed at 10/30/13 0805  Gross per 24 hour  Intake    720 ml  Output    750 ml  Net    -30 ml    Chest tube output 290 for 24 hrs.   Physical Exam: General: Awake and alert. Conversant HEENT: normal  Neck: supple. JVP normal.. No lymphadenopathy or thryomegaly appreciated.  Cor: Faint heart sounds , LVAD hum present.  Lungs: clear  Abdomen: soft, nontender, nondistended. No hepatosplenomegaly. No bruits or masses. Active bowel sounds. + bowel movement Driveline: C/D/I; securement device intact and driveline incorporated  Extremities: peripheral edema is decreasing - warm and well perfused Neuro: alert & orientedx3, cranial nerves grossly intact. moves all 4 extremities w/o difficulty. Affect pleasant  Surgical incisions all clean and dry  Telemetry: A-fib 92  Labs:  CXR 12-24 with mild interstitial edema- pump orientation excellent w/ inflow cannula directed at mitral valve- recheck CXR in am with more DOE  Basic Metabolic Panel:  Recent Labs Lab 10/24/13 0500 10/25/13 0442  10/25/13 2346 10/26/13 0420 10/27/13 0445 10/28/13 0413 10/29/13 0515 10/30/13 0500  NA 135 135  < > 133* 133* 134* 133* 132* 134*  K 3.4* 3.9  < > 3.8 3.5 3.9 3.9 4.0 3.9  CL 97 98  < > 97 97 99 98 97 98  CO2 29 29  --  29 28 28 27 25 28   GLUCOSE 113* 112*  < > 112* 101* 114* 103* 97 106*  BUN 20 18  < > 20 18 18 20 22 19   CREATININE 1.12 1.09  < > 1.22 1.15 1.18 1.15 1.20 1.26  CALCIUM 8.2* 8.0*  --  7.9* 7.9* 7.9* 8.0* 8.2* 8.8  MG 2.0 1.9  --  1.9 1.8  --   --   --   --   PHOS 2.5  2.3  --   --  2.7  --   --   --   --   < > = values in this interval not displayed.  Liver Function Tests:  Recent Labs Lab 10/26/13 0420 10/27/13 0445 10/28/13 0413 10/29/13 0515 10/30/13 0500  AST 19 33 69* 90* 85*  ALT 17 22 40 64* 76*  ALKPHOS 87 86 99 105 118*  BILITOT 0.6 0.6 0.7 0.7 0.8  PROT 4.9* 4.9* 5.1* 5.2* 5.4*  ALBUMIN 2.2* 2.1* 2.1* 2.2* 2.3*   No results found for this basename: LIPASE, AMYLASE,  in the last 168 hours No results found for this basename: AMMONIA,  in the last 168 hours  CBC:  Recent Labs Lab 10/24/13 0500 10/25/13 0442  10/26/13 0420 10/27/13 0445 10/28/13 0413 10/29/13 0515 10/30/13 0500  WBC 8.4 9.8  --  10.0 7.7 8.2 9.7 11.2*  NEUTROABS 6.2 7.7  --  7.7  --   --   --   --   HGB 9.4* 9.4*  < > 9.1* 9.1* 9.0* 9.2* 9.5*  HCT 28.3* 27.8*  < > 27.8* 27.2* 27.8* 28.5* 29.5*  MCV 91.6 91.7  --  93.3 93.2 93.6 93.4 94.9  PLT 98* 124*  --  153 190 224 276 298  < > = values in this interval not displayed.  INR:  Recent Labs Lab 10/26/13 0420 10/27/13 0445 10/28/13 0413 10/29/13 0515 10/30/13 0500  INR 2.55* 2.52* 2.52* 2.53* 2.57*   LDH 659 < 726>603>493>458>410  Other results:  EKG:   Imaging: Dg Chest 2 View  10/28/2013   CLINICAL DATA:  Shortness of Breath  EXAM: CHEST  2 VIEW  COMPARISON:  10/28/2013  FINDINGS: Cardiomediastinal silhouette is stable. Status post median sternotomy. Dual lead cardiac pacemaker is unchanged in position. Left ventricular assisting device is unchanged in position. No acute infiltrate or pulmonary edema. Scattered stable atelectasis or scarring in the lingula. Small left pleural effusion with left basilar atelectasis.  IMPRESSION: Status post median sternotomy. Dual lead cardiac pacemaker is unchanged in position. Left ventricular assisting device is unchanged in position. No acute infiltrate or pulmonary edema. Scattered stable atelectasis or scarring in the lingula. Small left pleural effusion with  left basilar atelectasis.   Electronically Signed   By: Natasha Mead M.D.   On: 10/28/2013 10:08     Medications:     Scheduled Medications: . amiodarone  400 mg Oral BID  . aspirin EC  325 mg Oral Daily  . bisacodyl  10 mg Oral Daily   Or  . bisacodyl  10 mg Rectal Daily  .  docusate sodium  200 mg Oral Daily  . feeding supplement (ENSURE COMPLETE)  237 mL Oral BID BM  . ferrous gluconate  324 mg Oral BID WC  . furosemide  40 mg Oral BID  . guaiFENesin  600 mg Oral BID  . metoprolol tartrate  25 mg Oral BID  . midodrine  5 mg Oral TID WC  . pantoprazole  40 mg Oral Daily  . potassium chloride  20 mEq Oral BID  . sodium chloride  10-40 mL Intracatheter Q12H  . warfarin  2 mg Oral ONCE-1800  . Warfarin - Physician Dosing Inpatient   Does not apply q1800    Infusions: . sodium chloride 20 mL/hr at 10/22/13 0800  . sodium chloride Stopped (10/28/13 0800)  . sodium chloride 250 mL (10/20/13 0600)    PRN Medications: ondansetron (ZOFRAN) IV, oxyCODONE, sodium chloride, sodium chloride, traMADol   Assessment:   1) Chronic systolic HF, Class IV, EF 20-25%  --s/p HM II VAD implant 12/15  2) NICM/ICM  3) Recurrent Vtach  - ablation 09/2011 and 06/2013  4) Atrial flutter  - s/p DC-CV 10/15/2013  5) CAD with recanalized occluded RCA with left to right collat.  6) PAD: s/p aorto-bifem, bilateral renal artery bypass, bilateral fem-pop, and left CEA.      Plan/Discussion:    With increased DOE and edema will increase lasix, potassium , CXR in am  Acute blood loss anemia: stable Hgb, on po Fe WBC with slight increase w/o fever- recheck in am  INR stable at 2.5 will redose coumadin 2 mg today  Patient now much stronger and getting up w/o orthostasis- doing well on stepdown    Cont VAD training, education.Cont PT  I reviewed the LVAD parameters from today, and compared the results to the patient's prior recorded data.  No programming changes were made.  The LVAD is  functioning within specified parameters.  The patient performs LVAD self-test daily.  LVAD interrogation was negative for any significant power changes, alarms or PI events/speed drops.  LVAD equipment check completed and is in good working order.  Back-up equipment present.   LVAD education done on emergency procedures and precautions and reviewed exit site care.  Length of Stay: 12  VAN TRIGT III,Darianne Muralles 10/30/2013, 8:41 AM

## 2013-10-30 NOTE — Progress Notes (Signed)
VAD coordinator paged to patient room with report of nausea and vomiting with drop in PI to 2.0; also drop in PI to 2's while vomiting; short runs of slow V tach. Pt pale, feels weak; nausea improved with IV Zofran. BP 84, 82, 64, 66 during and after episode.  Dr. Gala Romney at bedside; will obtain Co-ox, decrease Amiodarone to 200 mg bid; pump speed decreased to 8600. Primary and back up controller settings: fixed speed 8600 and low speed limit 8000 RPM.

## 2013-10-30 NOTE — Progress Notes (Signed)
CARDIAC REHAB PHASE I   PRE:  Rate/Rhythm: 106 afib  BP:  Supine:   Sitting: 76  Standing:    SaO2: 97% 2L 1222-1257 Pt unable to ambulate.  Pt had frequent runs of non sustained Vtach upon standing.  PI dropped from 6.0 to 2.5 upon standing.  PI recovers to 5.5 with sitting. RN made aware.  Pt c/o dyspnea more than usual today.   [name], [place]

## 2013-10-30 NOTE — Progress Notes (Signed)
Rehab admissions - Patient progressing well with therapies and likely will not need acute inpatient rehab stay.  Will follow up on Monday.  Call me for questions.  #[phone]

## 2013-10-30 NOTE — Consult Note (Signed)
ANTIBIOTIC CONSULT NOTE - INITIAL  Pharmacy Consult for Vancomycin and Zosyn Indication: bacteremia vs driveline infection  Allergies  Allergen Reactions  . Demerol [Meperidine]     Paralysis. Could only move eyes.  . Meperidine Hcl Other (See Comments)    "CAN'T MOVE" CATATONIC PER PT.  [name] Opium Other (See Comments)    "CAN'T MOVE" CATATONIC PER PT.    Patient Measurements: Height: 6' (182.9 cm) Weight: 203 lb 8 oz (92.307 kg) IBW/kg (Calculated) : 77.6  Vital Signs: Temp: 97.4 F (36.3 C) (12/26 1408) Temp src: Oral (12/26 1408) Pulse Rate: 110 (12/26 1408) Intake/Output from previous day: 12/25 0701 - 12/26 0700 In: 720 [P.O.:720] Out: 950 [Urine:950] Intake/Output from this shift: Total I/O In: 360 [P.O.:360] Out: 200 [Urine:200]  Labs:  Recent Labs  10/28/13 0413 10/29/13 0515 10/30/13 0500  WBC 8.2 9.7 11.2*  HGB 9.0* 9.2* 9.5*  PLT 224 276 298  CREATININE 1.15 1.20 1.26   Estimated Creatinine Clearance: 56.5 ml/min (by C-G formula based on Cr of 1.26).  Microbiology: Recent Results (from the past 720 hour(s))  URINE CULTURE     Status: None   Collection Time    10/12/13 10:43 AM      Result Value Range Status   Specimen Description URINE, RANDOM   Final   Special Requests Normal   Final   Culture  Setup Time     Final   Value: 10/12/2013 15:19     Performed at [facility] Count     Final   Value: 60,000 COLONIES/ML     Performed at [facility]   Culture     Final   Value: STAPHYLOCOCCUS SPECIES (COAGULASE NEGATIVE)     Note: RIFAMPIN AND GENTAMICIN SHOULD NOT BE USED AS SINGLE DRUGS FOR TREATMENT OF STAPH INFECTIONS.     Performed at [facility]   Report Status 10/14/2013 FINAL   Final   Organism ID, Bacteria STAPHYLOCOCCUS SPECIES (COAGULASE NEGATIVE)   Final  MRSA PCR SCREENING     Status: None   Collection Time    10/18/13 11:31 AM      Result Value Range Status   MRSA by PCR NEGATIVE  NEGATIVE Final    Comment:            The GeneXpert MRSA Assay (FDA     approved for NASAL specimens     only), is one component of a     comprehensive MRSA colonization     surveillance program. It is not     intended to diagnose MRSA     infection nor to guide or     monitor treatment for     MRSA infections.  URINE CULTURE     Status: None   Collection Time    10/26/13  9:43 AM      Result Value Range Status   Specimen Description URINE, CATHETERIZED   Final   Special Requests Normal   Final   Culture  Setup Time     Final   Value: 10/26/2013 14:13     Performed at [facility] Count     Final   Value: NO GROWTH     Performed at [facility]   Culture     Final   Value: NO GROWTH     Performed at [facility]   Report Status 10/27/2013 FINAL   Final    Medical History: Past Medical History  Diagnosis Date  . CAD (coronary artery disease)     s/p MI 1982;  s/p DES to CFX 2011;  s/p NSTEMI 4/12 in setting of VTach;  cath 7/12:  LM ok, LAD mid 30-40%, Dx's with 40%, mCFX stent patent, prox to mid RCA occluded with R to R and L to R collats.  Medical Tx was continued  . Bradycardia   . Ventricular tachycardia     s/p AICD;   h/o ICD shock;  Amiodarone Rx. S/P ablation in 2012  . 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 HTN (hypertension)   . HLD (hyperlipidemia)   . Cytopenia   . Hypothyroidism   . Renal insufficiency   . Claudication   . Chronic systolic heart failure   . Ischemic cardiomyopathy     echo 7/12:  EF 25-30%, mild AI, mod MR, mod LAE  . TIA (transient ischemic attack) 2001  . CVD (cerebrovascular disease)     left CEA 2008  . Stroke   . Anemia   . Inappropriate therapy from implantable cardioverter-defibrillator 04/02/2013    ATP for sinus tach  SVT wavelet identified SVT but was no  passive   .  Myocardial infarction 1992  . Anginal pain   . Automatic implantable cardioverter-defibrillator in situ     [employer]onic Evera device serial number [id number]    . Sleep apnea     denies wearing mask (05/25/2013)  . Arthritis     "a little in my knees" (05/25/2013)  . Melanoma of ear 2013    "near left ear" (05/25/2013)   Assessment: 72yom s/p LVAD 12/15 known to pharmacy from coumadin dosing/HF team.  Today he is hypotensive with N/V, pale, weak, and WBC is trending up. Also with increasing cough and slight drainage from his driveline. Concern for bacteremia vs driveline sit infection. He will begin empiric vancomycin and zosyn. Renal function is stable with sCr 1.2 and CrCl 77ml/min but watch closely as torsemide added back today.  Goal of Therapy:  Vancomycin trough 15-20  Plan:  1) Vancomycin 1000mg  IV x 1 then 750mg  IV q12 2) Zosyn 3.375g IV q8 (4 hour infusion) 3) Watch renal function, cultures, LOT, trough at steady state  Fredrik Rigger 10/30/2013,4:11 PM

## 2013-10-30 NOTE — Progress Notes (Signed)
Patient ID: [name], male   DOB: 09-28-1941, 72 y.o.   MRN: [id number]  HeartMate 2 Rounding Note  Subjective:    [name] is a 72 yo man with an ischemic CM s/p ICD implant, chronic systolic HF (EF 20-25%), paroxysmal VT s/p ablation (09/2011) and repeat (06/2013), CAD, PVD (s/p R fem pop BPG 01/2010; s/p L CFA BPG 05/2002), AAA (s/p repair 04/2002), CAS (s/p L CEA 2008), HTN, arthritis, and TIA. Recently admitted 12/8-12/11/14 for syncopal episode from inappropriate ATP which accelerated SVT>>VT with failed ATP and then spontaneous termination of VT. During hospital stay had repeat RHC, hemodynamics were stable and had TEE-DC-CV of AFL 10/15/13 which was successful. He was presented to our [facility] team and felt to be good LVAD candidate and then presented at [facility] transplant listing conference and they felt he was not a candidate for transplant, but good candidate for LVAD under DT criteria.   POD 11 HeartMate II LVAD  Feels good but c/o dyspnea with exertion. No PND, orthopnea or orthostasis. + LE edema. Weight up a pound.  Dr. Donata Clay resumes IV lasix today.   MAPs stable 70s. Remains AF in 80-90s. INR 2.5 no co-ox.  WBC increasing slowly 8.2->9.7->11.2   LVAD INTERROGATION:  HeartMate II LVAD:  Flow 3.9 liters/min, speed 8800, power 5.0, PI 5.0  Occasional  PI events throughout the day  Objective:    Vital Signs:   Temp:  [97.6 F (36.4 C)-98.9 F (37.2 C)] 98.6 F (37 C) (12/26 0454) Pulse Rate:  [78-94] 94 (12/26 0610) Resp:  [12-16] 12 (12/26 0454) BP: (76)/(1) 76/1 mmHg (12/25 1103) SpO2:  [92 %-99 %] 94 % (12/26 0454) Weight:  [92.307 kg (203 lb 8 oz)] 92.307 kg (203 lb 8 oz) (12/26 0454) Last BM Date: 10/24/13  Intake/Output:   Intake/Output Summary (Last 24 hours) at 10/30/13 0930 Last data filed at 10/30/13 0805  Gross per 24 hour  Intake    720 ml  Output    750 ml  Net    -30 ml     Physical Exam: General: Looking brighter HEENT: normal  Neck:  supple. JVP 8-10 . Carotids 2+ bilat; no bruits. No lymphadenopathy or thryomegaly appreciated.  Cor: Faint heart sounds , LVAD hum present.  Lungs: clear  Abdomen: soft, nontender, nondistended. No hepatosplenomegaly. No bruits or masses. Active bowel sounds.  Driveline: C/D/I; securement device intact and driveline incorporated  Extremities: warm.  1-2+ edema+ Neuro: alert & orientedx3, cranial nerves grossly intact. moves all 4 extremities w/o difficulty. Affect pleasant    Telemetry: A-fib 90s  Labs: Basic Metabolic Panel:  Recent Labs Lab 10/24/13 0500 10/25/13 0442  10/25/13 2346 10/26/13 0420 10/27/13 0445 10/28/13 0413 10/29/13 0515 10/30/13 0500  NA 135 135  < > 133* 133* 134* 133* 132* 134*  K 3.4* 3.9  < > 3.8 3.5 3.9 3.9 4.0 3.9  CL 97 98  < > 97 97 99 98 97 98  CO2 29 29  --  29 28 28 27 25 28   GLUCOSE 113* 112*  < > 112* 101* 114* 103* 97 106*  BUN 20 18  < > 20 18 18 20 22 19   CREATININE 1.12 1.09  < > 1.22 1.15 1.18 1.15 1.20 1.26  CALCIUM 8.2* 8.0*  --  7.9* 7.9* 7.9* 8.0* 8.2* 8.8  MG 2.0 1.9  --  1.9 1.8  --   --   --   --   PHOS 2.5 2.3  --   --  2.7  --   --   --   --   < > = values in this interval not displayed.  Liver Function Tests:  Recent Labs Lab 10/26/13 0420 10/27/13 0445 10/28/13 0413 10/29/13 0515 10/30/13 0500  AST 19 33 69* 90* 85*  ALT 17 22 40 64* 76*  ALKPHOS 87 86 99 105 118*  BILITOT 0.6 0.6 0.7 0.7 0.8  PROT 4.9* 4.9* 5.1* 5.2* 5.4*  ALBUMIN 2.2* 2.1* 2.1* 2.2* 2.3*   No results found for this basename: LIPASE, AMYLASE,  in the last 168 hours No results found for this basename: AMMONIA,  in the last 168 hours  CBC:  Recent Labs Lab 10/24/13 0500 10/25/13 0442  10/26/13 0420 10/27/13 0445 10/28/13 0413 10/29/13 0515 10/30/13 0500  WBC 8.4 9.8  --  10.0 7.7 8.2 9.7 11.2*  NEUTROABS 6.2 7.7  --  7.7  --   --   --   --   HGB 9.4* 9.4*  < > 9.1* 9.1* 9.0* 9.2* 9.5*  HCT 28.3* 27.8*  < > 27.8* 27.2* 27.8* 28.5*  29.5*  MCV 91.6 91.7  --  93.3 93.2 93.6 93.4 94.9  PLT 98* 124*  --  153 190 224 276 298  < > = values in this interval not displayed.  INR:  Recent Labs Lab 10/26/13 0420 10/27/13 0445 10/28/13 0413 10/29/13 0515 10/30/13 0500  INR 2.55* 2.52* 2.52* 2.53* 2.57*   LDH 659 < 726>603>493>458>410->->->->417  Other results:    Imaging: Dg Chest 2 View  10/28/2013   CLINICAL DATA:  Shortness of Breath  EXAM: CHEST  2 VIEW  COMPARISON:  10/28/2013  FINDINGS: Cardiomediastinal silhouette is stable. Status post median sternotomy. Dual lead cardiac pacemaker is unchanged in position. Left ventricular assisting device is unchanged in position. No acute infiltrate or pulmonary edema. Scattered stable atelectasis or scarring in the lingula. Small left pleural effusion with left basilar atelectasis.  IMPRESSION: Status post median sternotomy. Dual lead cardiac pacemaker is unchanged in position. Left ventricular assisting device is unchanged in position. No acute infiltrate or pulmonary edema. Scattered stable atelectasis or scarring in the lingula. Small left pleural effusion with left basilar atelectasis.   Electronically Signed   By: Natasha Mead M.D.   On: 10/28/2013 10:08     Medications:     Scheduled Medications: . amiodarone  400 mg Oral BID  . aspirin EC  325 mg Oral Daily  . bisacodyl  10 mg Oral Daily   Or  . bisacodyl  10 mg Rectal Daily  . docusate sodium  200 mg Oral Daily  . feeding supplement (ENSURE COMPLETE)  237 mL Oral BID BM  . ferrous gluconate  324 mg Oral BID WC  . furosemide  40 mg Oral BID  . guaiFENesin  600 mg Oral BID  . metoprolol tartrate  25 mg Oral BID  . midodrine  5 mg Oral TID WC  . pantoprazole  40 mg Oral Daily  . potassium chloride  20 mEq Oral BID  . sodium chloride  10-40 mL Intracatheter Q12H  . warfarin  2 mg Oral ONCE-1800  . Warfarin - Physician Dosing Inpatient   Does not apply q1800    Infusions: . sodium chloride 20 mL/hr at  10/22/13 0800  . sodium chloride Stopped (10/28/13 0800)  . sodium chloride 250 mL (10/20/13 0600)    PRN Medications: ondansetron (ZOFRAN) IV, oxyCODONE, sodium chloride, sodium chloride, traMADol   Assessment:   1) Chronic systolic  HF, Class IV, EF 20-25%  --s/p HM II VAD implant 12/15  2) NICM/ICM  3) Recurrent Vtach  - ablation 09/2011 and 06/2013  4) Atrial flutter  - s/p DC-CV 10/15/2013  5) CAD with recanalized occluded RCA with left to right collat.  6) PAD: s/p aorto-bifem, bilateral renal artery bypass, bilateral fem-pop, and left CEA.      Plan/Discussion:    Overall improving but volume is up today. Agree with IV lasix. Watch carefully for orthostasis. Also watch white count. No obvious signs infection. CXR scheduled for tomorrow.   Continue ambulation and education.   I reviewed the LVAD parameters from today, and compared the results to the patient's prior recorded data.  No programming changes were made.  The LVAD is functioning within specified parameters.  The patient performs LVAD self-test daily.  LVAD interrogation was negative for any significant power changes, alarms or PI events/speed drops.  LVAD equipment check completed and is in good working order.  Back-up equipment present.   LVAD education done on emergency procedures and precautions and reviewed exit site care.  Length of Stay: 12  Atalya Dano MD 10/30/2013, 9:30 AM

## 2013-10-30 NOTE — Progress Notes (Signed)
ECG noting frequent PVCs. Adjusted lead placement and returned to lead 2. Less PVCs noted HR 100 Afib. Patient sleeping in bed. Will continue to monitor.[name]

## 2013-10-30 NOTE — Progress Notes (Signed)
Dressing changed completed by family;  drive line site was pink , had minimal bloody drainage, and no foul oder.  Pt is resting in chair, with family at bedside, RN will continue to monitor.    Thane Edu, RN

## 2013-10-30 NOTE — Progress Notes (Signed)
Patient seen personally with VAD coordinator. Patient with hypotension and N/V and recurrent VT both at rest and while standing. Looked extremely pale and weak.  WBC trending up. Slight drainage from driveline. Increasing cough. Volume status up on exam with JVP ~9. 2+ LE edema. Co-ox 68%. Tele with multiple runs NSVT.  Concern for underlying infection on top of baseline orthostasis. VAD speed turned down to 8600. Will check CXR and CX. Start empiric vanc and zosyn (watch INR).  Driveline site examined and dressing changed.  Plan: 1) Check cx and CXR 2) Broad spectrum abx 3) Decrease amio to 200 bid 4) Attempt gentle diuresis with po demadex  The patient is critically ill with multiple organ systems failure and requires high complexity decision making for assessment and support, frequent evaluation and titration of therapies, application of advanced monitoring technologies and extensive interpretation of multiple databases.   Critical Care Time devoted to patient care services described in this note is 35 Minutes.  Daniel Bensimhon,MD 4:04 PM

## 2013-10-30 NOTE — Progress Notes (Signed)
LVAD dressing changed per Hessie Diener, Rn.Small amount of brown drainage noted around driveline site. Orders given for blood cultures and antibiotics. [name]

## 2013-10-30 NOTE — Progress Notes (Signed)
PT Cancellation Note  Patient Details Name: [name] MRN: [id number] DOB: Jun 22, 1941   Cancelled Treatment:    Reason Eval/Treat Not Completed: Medical issues which prohibited therapy.  Pt had an episode of Vtach after standing x2 earlier today.  Pt cannot participate today per pt.  Pt feels poorly after these episodes.  Nursing confirmed that pt cannot participate this pm.  Will return at later date.  Thanks.   [name],[name] 10/30/2013, 2:36 PM [facility] Acute Rehabilitation [phone] (pager)

## 2013-10-30 NOTE — Progress Notes (Signed)
OT Cancellation Note  Patient Details Name: [name] MRN: [id number] DOB: 08-21-1941   Cancelled Treatment:    Reason Eval/Treat Not Completed: Medical issues which prohibited therapy - pt having episodes of Vtach.  Will reattempt.  [name] M [phone] 10/30/2013, 1:55 PM

## 2013-10-31 ENCOUNTER — Inpatient Hospital Stay (HOSPITAL_COMMUNITY): Payer: Medicare Other

## 2013-10-31 DIAGNOSIS — I472 Ventricular tachycardia: Secondary | ICD-10-CM

## 2013-10-31 DIAGNOSIS — I369 Nonrheumatic tricuspid valve disorder, unspecified: Secondary | ICD-10-CM

## 2013-10-31 LAB — CBC
HCT: 29.5 % — ABNORMAL LOW (ref 39.0–52.0)
MCH: 30.4 pg (ref 26.0–34.0)
MCHC: 31.9 g/dL (ref 30.0–36.0)
MCV: 95.5 fL (ref 78.0–100.0)
Platelets: 308 10*3/uL (ref 150–400)
RBC: 3.09 MIL/uL — ABNORMAL LOW (ref 4.22–5.81)
WBC: 12.5 10*3/uL — ABNORMAL HIGH (ref 4.0–10.5)

## 2013-10-31 LAB — CARBOXYHEMOGLOBIN
Carboxyhemoglobin: 2.7 % — ABNORMAL HIGH (ref 0.5–1.5)
Methemoglobin: 1.1 % (ref 0.0–1.5)
O2 Saturation: 62.2 %
Total hemoglobin: 5.9 g/dL — CL (ref 13.5–18.0)

## 2013-10-31 LAB — URINALYSIS, ROUTINE W REFLEX MICROSCOPIC
Ketones, ur: NEGATIVE mg/dL
Leukocytes, UA: NEGATIVE
Nitrite: NEGATIVE
Protein, ur: NEGATIVE mg/dL
Urobilinogen, UA: 1 mg/dL (ref 0.0–1.0)

## 2013-10-31 LAB — BASIC METABOLIC PANEL
BUN: 19 mg/dL (ref 6–23)
CO2: 27 mEq/L (ref 19–32)
Calcium: 8.1 mg/dL — ABNORMAL LOW (ref 8.4–10.5)
Chloride: 98 mEq/L (ref 96–112)
Creatinine, Ser: 1.35 mg/dL (ref 0.50–1.35)
GFR calc Af Amer: 58 mL/min — ABNORMAL LOW (ref >90)
GFR calc non Af Amer: 50 mL/min — ABNORMAL LOW (ref >90)
Glucose, Bld: 98 mg/dL (ref 70–99)
Potassium: 4.5 mEq/L (ref 3.5–5.1)
Sodium: 132 mEq/L — ABNORMAL LOW (ref 135–145)

## 2013-10-31 LAB — URINE MICROSCOPIC-ADD ON

## 2013-10-31 LAB — LACTATE DEHYDROGENASE: LDH: 440 U/L — ABNORMAL HIGH (ref 94–250)

## 2013-10-31 MED ORDER — METOPROLOL TARTRATE 12.5 MG HALF TABLET
12.5000 mg | ORAL_TABLET | Freq: Two times a day (BID) | ORAL | Status: DC
  Filled 2013-10-31 (×2): qty 1

## 2013-10-31 MED ORDER — WARFARIN SODIUM 1 MG PO TABS
1.0000 mg | ORAL_TABLET | Freq: Once | ORAL | Status: AC
  Administered 2013-10-31: 1 mg via ORAL
  Filled 2013-10-31: qty 1

## 2013-10-31 MED ORDER — METOPROLOL TARTRATE 12.5 MG HALF TABLET
12.5000 mg | ORAL_TABLET | Freq: Once | ORAL | Status: AC
  Administered 2013-10-31: 12.5 mg via ORAL
  Filled 2013-10-31: qty 1

## 2013-10-31 MED ORDER — SODIUM CHLORIDE 0.9 % IV BOLUS (SEPSIS): 250.0000 mL | Freq: Once | INTRAVENOUS | Status: DC

## 2013-10-31 MED ORDER — SODIUM CHLORIDE 0.9 % IV BOLUS (SEPSIS)
250.0000 mL | Freq: Once | INTRAVENOUS | Status: AC
  Administered 2013-10-31: 250 mL via INTRAVENOUS

## 2013-10-31 MED ORDER — WARFARIN SODIUM 5 MG IV SOLR: 1.0000 mg | Freq: Once | INTRAVENOUS | Status: DC

## 2013-10-31 MED ORDER — AMIODARONE HCL 200 MG PO TABS
200.0000 mg | ORAL_TABLET | Freq: Every day | ORAL | Status: DC
  Administered 2013-11-01: 200 mg via ORAL
  Filled 2013-10-31 (×2): qty 1

## 2013-10-31 NOTE — Progress Notes (Signed)
CARDIAC REHAB PHASE I   PRE:  Rate/Rhythm: Paced 82  BP:    Sitting: 66 doppler    SaO2: 94 Room Air  MODE:  Ambulation: 180 ft   POST:  Rate/Rhythem: Paced 94  BP:    Sitting: 82 doppler     SaO2: 86 2l/min 92 after rest  1015-1120  Patient ambulated in hallway with assistance times two. RN with chair nearby. Dr Clarise Cruz present. Patient ambulated in the hallway stopped times one to rest. [name] only complained of shortness breath denied dizziness. Patient assisted back to recliner with call bell within reach.  [name], [name]

## 2013-10-31 NOTE — Progress Notes (Signed)
Upon routine MAP check RN found pt PI to be 1.8 on arrival to room.  Pt. aroused and asymptomatic, PI increased to 3.  MAP 68 heart rate 86. Spoke with [facility] LVAD RN to make aware of PI changes.  She is to notify MD, and RN to continue to monitor pt. To call back if pt becomes symptomatic.  Call light in reach, will continue to monitor.   Thane Edu, RN

## 2013-10-31 NOTE — Progress Notes (Signed)
PT Cancellation Note  Patient Details Name: NIKOS ANGLEMYER MRN: [id number] DOB: 19-Sep-1941   Cancelled Treatment:    Reason Eval/Treat Not Completed: Other (comment) (Cardiac rehab was working with pt.) Will return Monday.  Thanks.  [name],Ytzel Gubler 10/31/2013, 11:00 AM [facility] [phone] (pager)

## 2013-10-31 NOTE — Progress Notes (Signed)
250 NS bolus given as ordered. Will monitor patient. Akin Yi, Randall An RN

## 2013-10-31 NOTE — Progress Notes (Signed)
Nursing note Pt MAP 58 patient states he feels "ok"/asymptomatic, not lightheaded or dizzy, PI on VAD monitor ranging from 2.7-3.1, Dr Gala Romney made aware and orders recieved will continue to monitor patient. Rien Marland, Randall An  rN

## 2013-10-31 NOTE — Progress Notes (Signed)
Results of Carboxyhemaglobin called to DR Bensimhon orders received will monitor patient. Shannyn Jankowiak, Randall An rN

## 2013-10-31 NOTE — Progress Notes (Signed)
  Echocardiogram 2D Echocardiogram has been performed.  [name] [name] 10/31/2013, 4:46 PM

## 2013-10-31 NOTE — Progress Notes (Signed)
Patient ID: [name], male   DOB: 03/09/1941, 72 y.o.   MRN: [id number]  HeartMate 2 Rounding Note  Subjective:    [name] is a 72 yo man with an ischemic CM s/p ICD implant, chronic systolic HF (EF 20-25%), paroxysmal VT s/p ablation (09/2011) and repeat (06/2013), CAD, PVD (s/p R fem pop BPG 01/2010; s/p L CFA BPG 05/2002), AAA (s/p repair 04/2002), CAS (s/p L CEA 2008), HTN, arthritis, and TIA. Recently admitted 12/8-12/11/14 for syncopal episode from inappropriate ATP which accelerated SVT>>VT with failed ATP and then spontaneous termination of VT. During hospital stay had repeat RHC, hemodynamics were stable and had TEE-DC-CV of AFL 10/15/13 which was successful. He was presented to our [facility] team and felt to be good LVAD candidate and then presented at [facility] transplant listing conference and they felt he was not a candidate for transplant, but good candidate for LVAD under DT criteria.   POD 12 HeartMate II LVAD  Yesterday had episode of nausea, vomiting and hypotension. Felt pale and weak. PI dropped. Episodes of NSVT. Pump speed decreased to 8600. Started on broad spectrum abx. Also placed on low-dose torsemide due to worsening edema. Co-ox ok at 68% Last night PI dropped again while in bed. Apparently in the 2.5-3.0 range for about an hour. Asymptomatic. PI now back up in 3.5 range. Weight down 1 pound. Urine output poor.   Repeat echo pending.  MAPs 65-70s. Remains AF in 80-90s. INR 2.5-> 3.1  WBC increasing slowly 8.2->9.7->11.2-> (abx started) -> 12.5 LDH 440   UA (12/26): ok BCx (12/26) : NGTD CXR: resolving CHF   LVAD INTERROGATION:  HeartMate II LVAD:  Flow 3.7 liters/min, speed 8600, power 4.0, PI 3.8 Numerous PI events  Objective:    Vital Signs:   Temp:  [97.4 F (36.3 C)-98.2 F (36.8 C)] 97.7 F (36.5 C) (12/27 0639) Pulse Rate:  [85-110] 98 (12/27 0639) Resp:  [18] 18 (12/26 2033) SpO2:  [93 %-97 %] 96 % (12/27 0639) Weight:  [92 kg (202 lb 13.2 oz)] 92  kg (202 lb 13.2 oz) (12/27 0639) Last BM Date: 10/24/13  Intake/Output:   Intake/Output Summary (Last 24 hours) at 10/31/13 1034 Last data filed at 10/31/13 4132  Gross per 24 hour  Intake    360 ml  Output    900 ml  Net   -540 ml     Physical Exam: General: Sitting in chair. Looks better this am HEENT: normal  Neck: supple. JVP 7-8 . Carotids 2+ bilat; no bruits. No lymphadenopathy or thryomegaly appreciated.  Cor: Faint heart sounds , LVAD hum present.  Lungs: clear  Abdomen: soft, nontender, nondistended. No hepatosplenomegaly. No bruits or masses. Active bowel sounds.  Driveline: C/D/I; securement device intact and driveline incorporated  Extremities: warm. TED hose in place  Tr-1+ edema Neuro: alert & orientedx3, cranial nerves grossly intact. moves all 4 extremities w/o difficulty. Affect pleasant    Telemetry: A-fib 80-90s  Labs: Basic Metabolic Panel:  Recent Labs Lab 10/25/13 0442  10/25/13 2346 10/26/13 0420 10/27/13 0445 10/28/13 0413 10/29/13 0515 10/30/13 0500 10/31/13 0555  NA 135  < > 133* 133* 134* 133* 132* 134* 132*  K 3.9  < > 3.8 3.5 3.9 3.9 4.0 3.9 4.5  CL 98  < > 97 97 99 98 97 98 98  CO2 29  --  29 28 28 27 25 28 27   GLUCOSE 112*  < > 112* 101* 114* 103* 97 106* 98  BUN 18  < >  20 18 18 20 22 19 19   CREATININE 1.09  < > 1.22 1.15 1.18 1.15 1.20 1.26 1.35  CALCIUM 8.0*  --  7.9* 7.9* 7.9* 8.0* 8.2* 8.8 8.1*  MG 1.9  --  1.9 1.8  --   --   --   --   --   PHOS 2.3  --   --  2.7  --   --   --   --   --   < > = values in this interval not displayed.  Liver Function Tests:  Recent Labs Lab 10/26/13 0420 10/27/13 0445 10/28/13 0413 10/29/13 0515 10/30/13 0500  AST 19 33 69* 90* 85*  ALT 17 22 40 64* 76*  ALKPHOS 87 86 99 105 118*  BILITOT 0.6 0.6 0.7 0.7 0.8  PROT 4.9* 4.9* 5.1* 5.2* 5.4*  ALBUMIN 2.2* 2.1* 2.1* 2.2* 2.3*   No results found for this basename: LIPASE, AMYLASE,  in the last 168 hours No results found for this  basename: AMMONIA,  in the last 168 hours  CBC:  Recent Labs Lab 10/25/13 0442  10/26/13 0420 10/27/13 0445 10/28/13 0413 10/29/13 0515 10/30/13 0500 10/31/13 0555  WBC 9.8  --  10.0 7.7 8.2 9.7 11.2* 12.5*  NEUTROABS 7.7  --  7.7  --   --   --   --   --   HGB 9.4*  < > 9.1* 9.1* 9.0* 9.2* 9.5* 9.4*  HCT 27.8*  < > 27.8* 27.2* 27.8* 28.5* 29.5* 29.5*  MCV 91.7  --  93.3 93.2 93.6 93.4 94.9 95.5  PLT 124*  --  153 190 224 276 298 308  < > = values in this interval not displayed.  INR:  Recent Labs Lab 10/27/13 0445 10/28/13 0413 10/29/13 0515 10/30/13 0500 10/31/13 0555  INR 2.52* 2.52* 2.53* 2.57* 3.06*   LDH 659 < 726>603>493>458>410->->->->417  Other results:    Imaging: Dg Chest Port 1 View  10/31/2013   CLINICAL DATA:  CHF.  EXAM: PORTABLE CHEST - 1 VIEW  COMPARISON:  October 30, 2013.  FINDINGS: The cardiopericardial silhouette remains enlarged. The pulmonary vascularity is less prominent today. The pulmonary interstitial markings also have improved. Small amounts of pleural fluid remain at the lung bases. The permanent pacemaker defibrillator appears to be in reasonable position. The mediastinum is not abnormally widened. The right-sided PICC line has its tip in the region of proximal SVC. The mediastinum is normal in width.  IMPRESSION: There has been further interval improvement in the pulmonary interstitium consistent with resolving CHF. There remains density in the retrocardiac region and tiny amounts of pleural fluid bilaterally.   Electronically Signed   By: David  [place]   On: 10/31/2013 08:56   Dg Chest Port 1 View  10/30/2013   CLINICAL DATA:  CHF and worsening cough  EXAM: PORTABLE CHEST - 1 VIEW  COMPARISON:  October 28, 2013.  FINDINGS: The cardiopericardial silhouette is enlarged. The pulmonary vascularity is mildly engorged in the interstitial markings are increased especially on the left. There may be a trace of pleural fluid blunting the  costophrenic angles. The permanent pacemaker defibrillator is unchanged in position. There is no alveolar infiltrate. There is no pneumothorax.  IMPRESSION: The findings are consistent with congestive heart failure with mild pulmonary interstitial edema. No alveolar pneumonia is demonstrated.   Electronically Signed   By: David  [place]   On: 10/30/2013 17:32     Medications:     Scheduled Medications: . amiodarone  200 mg Oral BID  . aspirin EC  325 mg Oral Daily  . bisacodyl  10 mg Oral Daily   Or  . bisacodyl  10 mg Rectal Daily  . docusate sodium  200 mg Oral Daily  . feeding supplement (ENSURE COMPLETE)  237 mL Oral BID BM  . ferrous gluconate  324 mg Oral BID WC  . guaiFENesin  600 mg Oral BID  . metoprolol tartrate  25 mg Oral BID  . midodrine  5 mg Oral TID WC  . pantoprazole  40 mg Oral Daily  . piperacillin-tazobactam (ZOSYN)  IV  3.375 g Intravenous Q8H  . potassium chloride  20 mEq Oral BID  . sodium chloride  10-40 mL Intracatheter Q12H  . torsemide  20 mg Oral BID  . vancomycin  750 mg Intravenous Q12H  . Warfarin - Physician Dosing Inpatient   Does not apply q1800    Infusions: . sodium chloride 20 mL/hr at 10/22/13 0800  . sodium chloride Stopped (10/28/13 0800)  . sodium chloride 250 mL (10/20/13 0600)    PRN Medications: ondansetron (ZOFRAN) IV, oxyCODONE, sodium chloride, sodium chloride, traMADol   Assessment:   1) Chronic systolic HF, Class IV, EF 20-25%  --s/p HM II VAD implant 12/15  2) NICM/ICM  3) Recurrent Vtach  - ablation 09/2011 and 06/2013  4) Atrial flutter  - s/p DC-CV 10/15/2013  5) CAD with recanalized occluded RCA with left to right collat.  6) PAD: s/p aorto-bifem, bilateral renal artery bypass, bilateral fem-pop, and left CEA.     Plan/Discussion:    Had a rough day yesterday. Etiology of PI events unclear. On exam seems a bit volume overloaded but acting intravascularly depleted/orthostatic. Co-ox ok. CXR clearing.   I have  turned pump down to 8400. Will hold diuretics at this point. Cut back amio and metoprolol.   Spoke with EP yesterday who recommended quinidine if VT returns. Will not start at this point.   Coumadin dosing d/w Pharmacy. With abx will cut back to 1 mg today. Given low VAD speed need to keep INR therapeutic.   Will continue midodrine for now. If PI events continue may need RHC (with ramp study) next week. Getting repeat echo today.   Cardiac Rehab will try to stand him up again now and I will join them.   I reviewed the LVAD parameters from today, and compared the results to the patient's prior recorded data.  No programming changes were made.  The LVAD is functioning within specified parameters.  The patient performs LVAD self-test daily.  LVAD interrogation was negative for any significant power changes, alarms or PI events/speed drops.  LVAD equipment check completed and is in good working order.  Back-up equipment present.   LVAD education done on emergency procedures and precautions and reviewed exit site care.  Length of Stay: 17  Arvilla Meres MD 10/31/2013, 10:34 AM

## 2013-10-31 NOTE — Progress Notes (Signed)
Patient ID: [name], male   DOB: [birth year], 72 y.o.   MRN: [id number]  HeartMate 2 Rounding Note  Subjective:    [name] is a 72 yo man with an ischemic CM s/p ICD implant, chronic systolic HF (EF 20-25%), paroxysmal VT s/p ablation (09/2011) and repeat (06/2013), CAD, PVD (s/p R fem pop BPG 01/2010; s/p L CFA BPG 05/2002), AAA (s/p repair 04/2002), CAS (s/p L CEA 2008), HTN, arthritis, and TIA. Recently admitted 12/8-12/11/14 for syncopal episode from inappropriate ATP which accelerated SVT>>VT with failed ATP and then spontaneous termination of VT. During hospital stay had repeat RHC, hemodynamics were stable and had TEE-DC-CV of AFL 10/15/13 which was successful. He was presented to our [facility] team and felt to be good LVAD candidate and then presented at [facility] transplant listing conference and they felt he was not a candidate for transplant, but good candidate for LVAD under DT criteria.   POD 12 HeartMate II LVAD  He had a  Poor day yesterday from nausea( ? amio ), VT (low LV volume ) low BP and low PI ( RV dysfunction on 25 bid metoprolol)  He ambulated 180 feet today and feels better Less nausea, better BP and PI His wt. is down a pound and,demedex stopped   Stable rate controlled afib w/o VT today  Repeat echo pending No clinical bleeding but IBR up to 3.1 on antibiotics   Co-ox this am 61 %, stable  His ICD has not shocked since VAD implant.  Nurse reports drive line yesterday looks ok with slight redness around it, minimal bloody drainage, no purulence No more fever, WBC 11k  on antibiotics . LVAD INTERROGATION:  HeartMate II LVAD:  Flow 4.2 liters/min, speed 8600, power 5.0, PI.4.8  PI events less today  Objective:    Vital Signs:   Temp:  [97.4 F (36.3 C)-98.2 F (36.8 C)] 97.7 F (36.5 C) (12/27 0639) Pulse Rate:  [85-110] 87 (12/27 1044) Resp:  [18] 18 (12/26 2033) SpO2:  [93 %-97 %] 94 % (12/27 1044) Weight:  [202 lb 13.2 oz (92 kg)] 202 lb 13.2 oz (92  kg) (12/27 0639) Last BM Date: 10/24/13 Mean arterial Pressure 74-82 CVP 10 12-24  Intake/Output:   Intake/Output Summary (Last 24 hours) at 10/31/13 1124 Last data filed at 10/31/13 0619  Gross per 24 hour  Intake    360 ml  Output    900 ml  Net   -540 ml      Physical Exam: General: Awake and alert. Conversant HEENT: normal  Neck: supple. JVP normal.. No lymphadenopathy or thryomegaly appreciated.  Cor: Faint heart sounds , LVAD hum present.  Lungs: clear  Abdomen: soft, nontender, nondistended. No hepatosplenomegaly. No bruits or masses. Active bowel sounds. + bowel movement Driveline: C/D/I; securement device intact and driveline incorporated  Extremities: peripheral edema is decreasing - warm and well perfused Neuro: alert & orientedx3, cranial nerves grossly intact. moves all 4 extremities w/o difficulty. Affect pleasant  Surgical incisions all clean and dry  Telemetry: A-fib 92  Labs:  CXR 12-24 with mild interstitial edema- pump orientation excellent w/ inflow cannula directed at mitral valve-   CXR 12-26 mild edema no effusions  Basic Metabolic Panel:  Recent Labs Lab 10/25/13 0442  10/25/13 2346 10/26/13 0420 10/27/13 0445 10/28/13 0413 10/29/13 0515 10/30/13 0500 10/31/13 0555  NA 135  < > 133* 133* 134* 133* 132* 134* 132*  K 3.9  < > 3.8 3.5 3.9 3.9 4.0 3.9 4.5  CL 98  < > 97 97 99 98 97 98 98  CO2 29  --  29 28 28 27 25 28 27   GLUCOSE 112*  < > 112* 101* 114* 103* 97 106* 98  BUN 18  < > 20 18 18 20 22 19 19   CREATININE 1.09  < > 1.22 1.15 1.18 1.15 1.20 1.26 1.35  CALCIUM 8.0*  --  7.9* 7.9* 7.9* 8.0* 8.2* 8.8 8.1*  MG 1.9  --  1.9 1.8  --   --   --   --   --   PHOS 2.3  --   --  2.7  --   --   --   --   --   < > = values in this interval not displayed.  Liver Function Tests:  Recent Labs Lab 10/26/13 0420 10/27/13 0445 10/28/13 0413 10/29/13 0515 10/30/13 0500  AST 19 33 69* 90* 85*  ALT 17 22 40 64* 76*  ALKPHOS 87 86 99 105  118*  BILITOT 0.6 0.6 0.7 0.7 0.8  PROT 4.9* 4.9* 5.1* 5.2* 5.4*  ALBUMIN 2.2* 2.1* 2.1* 2.2* 2.3*   No results found for this basename: LIPASE, AMYLASE,  in the last 168 hours No results found for this basename: AMMONIA,  in the last 168 hours  CBC:  Recent Labs Lab 10/25/13 0442  10/26/13 0420 10/27/13 0445 10/28/13 0413 10/29/13 0515 10/30/13 0500 10/31/13 0555  WBC 9.8  --  10.0 7.7 8.2 9.7 11.2* 12.5*  NEUTROABS 7.7  --  7.7  --   --   --   --   --   HGB 9.4*  < > 9.1* 9.1* 9.0* 9.2* 9.5* 9.4*  HCT 27.8*  < > 27.8* 27.2* 27.8* 28.5* 29.5* 29.5*  MCV 91.7  --  93.3 93.2 93.6 93.4 94.9 95.5  PLT 124*  --  153 190 224 276 298 308  < > = values in this interval not displayed.  INR:  Recent Labs Lab 10/27/13 0445 10/28/13 0413 10/29/13 0515 10/30/13 0500 10/31/13 0555  INR 2.52* 2.52* 2.53* 2.57* 3.06*   LDH 659 < 726>603>493>458>410  Other results:  EKG:   Imaging: Dg Chest Port 1 View  10/31/2013   CLINICAL DATA:  CHF.  EXAM: PORTABLE CHEST - 1 VIEW  COMPARISON:  October 30, 2013.  FINDINGS: The cardiopericardial silhouette remains enlarged. The pulmonary vascularity is less prominent today. The pulmonary interstitial markings also have improved. Small amounts of pleural fluid remain at the lung bases. The permanent pacemaker defibrillator appears to be in reasonable position. The mediastinum is not abnormally widened. The right-sided PICC line has its tip in the region of proximal SVC. The mediastinum is normal in width.  IMPRESSION: There has been further interval improvement in the pulmonary interstitium consistent with resolving CHF. There remains density in the retrocardiac region and tiny amounts of pleural fluid bilaterally.   Electronically Signed   By: David  [place]   On: 10/31/2013 08:56   Dg Chest Port 1 View  10/30/2013   CLINICAL DATA:  CHF and worsening cough  EXAM: PORTABLE CHEST - 1 VIEW  COMPARISON:  October 28, 2013.  FINDINGS: The  cardiopericardial silhouette is enlarged. The pulmonary vascularity is mildly engorged in the interstitial markings are increased especially on the left. There may be a trace of pleural fluid blunting the costophrenic angles. The permanent pacemaker defibrillator is unchanged in position. There is no alveolar infiltrate. There is no pneumothorax.  IMPRESSION: The findings  are consistent with congestive heart failure with mild pulmonary interstitial edema. No alveolar pneumonia is demonstrated.   Electronically Signed   By: David  [place]   On: 10/30/2013 17:32     Medications:     Scheduled Medications: . [START ON 11/01/2013] amiodarone  200 mg Oral Daily  . aspirin EC  325 mg Oral Daily  . bisacodyl  10 mg Oral Daily   Or  . bisacodyl  10 mg Rectal Daily  . docusate sodium  200 mg Oral Daily  . feeding supplement (ENSURE COMPLETE)  237 mL Oral BID BM  . ferrous gluconate  324 mg Oral BID WC  . guaiFENesin  600 mg Oral BID  . [START ON 11/01/2013] metoprolol tartrate  12.5 mg Oral BID  . metoprolol tartrate  12.5 mg Oral Once  . midodrine  5 mg Oral TID WC  . pantoprazole  40 mg Oral Daily  . piperacillin-tazobactam (ZOSYN)  IV  3.375 g Intravenous Q8H  . sodium chloride  10-40 mL Intracatheter Q12H  . vancomycin  750 mg Intravenous Q12H  . warfarin  1 mg Oral ONCE-1800  . Warfarin - Physician Dosing Inpatient   Does not apply q1800    Infusions: . sodium chloride 20 mL/hr at 10/22/13 0800  . sodium chloride Stopped (10/28/13 0800)  . sodium chloride 250 mL (10/20/13 0600)    PRN Medications: ondansetron (ZOFRAN) IV, oxyCODONE, sodium chloride, sodium chloride, traMADol   Assessment:   1) Chronic systolic HF, Class IV, EF 20-25%  --s/p HM II VAD implant 12/15  2) NICM/ICM  3) Recurrent Vtach  - ablation 09/2011 and 06/2013  4) Atrial flutter  - s/p DC-CV 10/15/2013  5) CAD with recanalized occluded RCA with left to right collat.  6) PAD: s/p aorto-bifem, bilateral renal  artery bypass, bilateral fem-pop, and left CEA.  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  No programming changes were made.  The LVAD is functioning within specified parameters.  The patient performs LVAD self-test daily.  LVAD interrogation was negative for any significant power changes, alarms or PI events/speed drops.  LVAD equipment check completed and is in good working order.  Back-up equipment present.   LVAD education done on emergency procedures and precautions and reviewed exit site care.  Length of Stay: 13  VAN TRIGT III,[name] 10/31/2013, 11:24 AM

## 2013-11-01 ENCOUNTER — Inpatient Hospital Stay (HOSPITAL_COMMUNITY): Payer: Medicare Other

## 2013-11-01 DIAGNOSIS — I50810 Right heart failure, unspecified: Secondary | ICD-10-CM

## 2013-11-01 LAB — CBC
HCT: 27.5 % — ABNORMAL LOW (ref 39.0–52.0)
Hemoglobin: 8.8 g/dL — ABNORMAL LOW (ref 13.0–17.0)
MCH: 30.3 pg (ref 26.0–34.0)
MCHC: 32 g/dL (ref 30.0–36.0)
MCV: 94.8 fL (ref 78.0–100.0)
RBC: 2.9 MIL/uL — ABNORMAL LOW (ref 4.22–5.81)
RDW: 17.8 % — ABNORMAL HIGH (ref 11.5–15.5)
WBC: 10.6 10*3/uL — ABNORMAL HIGH (ref 4.0–10.5)

## 2013-11-01 LAB — BASIC METABOLIC PANEL
BUN: 18 mg/dL (ref 6–23)
CO2: 28 mEq/L (ref 19–32)
Calcium: 8 mg/dL — ABNORMAL LOW (ref 8.4–10.5)
Chloride: 97 mEq/L (ref 96–112)
Creatinine, Ser: 1.38 mg/dL — ABNORMAL HIGH (ref 0.50–1.35)
GFR calc Af Amer: 57 mL/min — ABNORMAL LOW (ref >90)
GFR calc non Af Amer: 49 mL/min — ABNORMAL LOW (ref >90)
Glucose, Bld: 136 mg/dL — ABNORMAL HIGH (ref 70–99)
Potassium: 3.8 mEq/L (ref 3.5–5.1)
Sodium: 132 mEq/L — ABNORMAL LOW (ref 135–145)

## 2013-11-01 LAB — HEPATIC FUNCTION PANEL
ALT: 40 U/L (ref 0–53)
AST: 31 U/L (ref 0–37)
Albumin: 2.2 g/dL — ABNORMAL LOW (ref 3.5–5.2)
Alkaline Phosphatase: 107 U/L (ref 39–117)
Bilirubin, Direct: 0.3 mg/dL (ref 0.0–0.3)
Indirect Bilirubin: 0.4 mg/dL (ref 0.3–0.9)
Total Bilirubin: 0.7 mg/dL (ref 0.3–1.2)
Total Protein: 5 g/dL — ABNORMAL LOW (ref 6.0–8.3)

## 2013-11-01 LAB — PROTIME-INR: INR: 3.06 — ABNORMAL HIGH (ref 0.00–1.49)

## 2013-11-01 LAB — LACTATE DEHYDROGENASE: LDH: 442 U/L — ABNORMAL HIGH (ref 94–250)

## 2013-11-01 MED ORDER — WARFARIN SODIUM 1 MG PO TABS
1.0000 mg | ORAL_TABLET | Freq: Once | ORAL | Status: AC
  Administered 2013-11-01: 1 mg via ORAL
  Filled 2013-11-01: qty 1

## 2013-11-01 MED ORDER — AMOXICILLIN-POT CLAVULANATE 875-125 MG PO TABS
1.0000 | ORAL_TABLET | Freq: Two times a day (BID) | ORAL | Status: DC
  Administered 2013-11-01 – 2013-11-02 (×4): 1 via ORAL
  Filled 2013-11-01 (×6): qty 1

## 2013-11-01 MED ORDER — TORSEMIDE 20 MG PO TABS
20.0000 mg | ORAL_TABLET | Freq: Every day | ORAL | Status: DC
  Administered 2013-11-02 – 2013-11-03 (×2): 20 mg via ORAL
  Filled 2013-11-01 (×2): qty 1

## 2013-11-01 NOTE — Progress Notes (Signed)
Patient ID: [name], male   DOB: 07/08/1941, 72 y.o.   MRN: [id number]  HeartMate 2 Rounding Note  Subjective:    [name] is a 72 yo man with an ischemic CM s/p ICD implant, chronic systolic HF (EF 20-25%), paroxysmal VT s/p ablation (09/2011) and repeat (06/2013), CAD, PVD (s/p R fem pop BPG 01/2010; s/p L CFA BPG 05/2002), AAA (s/p repair 04/2002), CAS (s/p L CEA 2008), HTN, arthritis, and TIA. Recently admitted 12/8-12/11/14 for syncopal episode from inappropriate ATP which accelerated SVT>>VT with failed ATP and then spontaneous termination of VT. During hospital stay had repeat RHC, hemodynamics were stable and had TEE-DC-CV of AFL 10/15/13 which was successful. He was presented to our [facility] team and felt to be good LVAD candidate and then presented at [facility] transplant listing conference and they felt he was not a candidate for transplant, but good candidate for LVAD under DT criteria.   POD 13 HeartMate II LVAD  He had a  Poor day 12-26 from nausea( ? amio ), VT (low LV volume ) low BP and low PI ( RV dysfunction on 25 bid metoprolol)  Yesterday he had low PI, low BP a few hours after metoprolol given ( 12.5) and was given 250 cc NS and pump speed down to 8400 rpm  Lopressor now completely stopped, MAP 80. PI 5.8  He feels better today with less PI events, wt up 2 lbs  Stable rate controlled afib w/o VT today- pacer actually pacing at 70   Repeat echo  With mod-sev RV dysfunction, LV small  No clinical bleeding but INR up to 3.1 on antibiotics   Co-ox this am 61 %, stable  His ICD has not shocked since VAD implant.  Nurse reports drive line yesterday looks ok with slight redness around it, minimal bloody drainage, no purulence No more fever, WBC 12k  on antibiotics . LVAD INTERROGATION:  HeartMate II LVAD:  Flow 4.2 liters/min, speed 8400, power 5.0, PI.5.8  -  4 PI events today this am  Objective:    Vital Signs:   Temp:  [97.6 F (36.4 C)-98.4 F (36.9 C)] 98.4  F (36.9 C) (12/28 4098) Pulse Rate:  [74-90] 83 (12/28 0614) Resp:  [15-16] 15 (12/28 0614) SpO2:  [94 %-99 %] 98 % (12/28 0614) Weight:  [205 lb 3.2 oz (93.078 kg)] 205 lb 3.2 oz (93.078 kg) (12/28 0433) Last BM Date: 10/31/13 Mean arterial Pressure 74-82 CVP 10 12-24  Intake/Output:   Intake/Output Summary (Last 24 hours) at 11/01/13 1041 Last data filed at 11/01/13 0900  Gross per 24 hour  Intake   1380 ml  Output    500 ml  Net    880 ml      Physical Exam: General: Awake and alert. Conversant HEENT: normal  Neck: supple. JVP normal.. No lymphadenopathy or thryomegaly appreciated.  Cor: Faint heart sounds , LVAD hum present.  Lungs: clear  Abdomen: soft, nontender, nondistended. No hepatosplenomegaly. No bruits or masses. Active bowel sounds. + bowel movement Driveline: C/D/I; securement device intact and driveline incorporated  Extremities: peripheral edema is decreasing - warm and well perfused Neuro: alert & orientedx3, cranial nerves grossly intact. moves all 4 extremities w/o difficulty. Affect pleasant  Surgical incisions all clean and dry  Telemetry: A-fib 70  Labs:  CXR 12-24 with mild interstitial edema- pump orientation excellent w/ inflow cannula directed at mitral valve-   CXR 12-28 mild edema no effusions  Basic Metabolic Panel:  Recent Labs Lab  10/25/13 2346 10/26/13 0420  10/28/13 0413 10/29/13 0515 10/30/13 0500 10/31/13 0555 11/01/13 0914  NA 133* 133*  < > 133* 132* 134* 132* 132*  K 3.8 3.5  < > 3.9 4.0 3.9 4.5 3.8  CL 97 97  < > 98 97 98 98 97  CO2 29 28  < > 27 25 28 27 28   GLUCOSE 112* 101*  < > 103* 97 106* 98 136*  BUN 20 18  < > 20 22 19 19 18   CREATININE 1.22 1.15  < > 1.15 1.20 1.26 1.35 1.38*  CALCIUM 7.9* 7.9*  < > 8.0* 8.2* 8.8 8.1* 8.0*  MG 1.9 1.8  --   --   --   --   --   --   PHOS  --  2.7  --   --   --   --   --   --   < > = values in this interval not displayed.  Liver Function Tests:  Recent Labs Lab  10/27/13 0445 10/28/13 0413 10/29/13 0515 10/30/13 0500 11/01/13 0914  AST 33 69* 90* 85* 31  ALT 22 40 64* 76* 40  ALKPHOS 86 99 105 118* 107  BILITOT 0.6 0.7 0.7 0.8 0.7  PROT 4.9* 5.1* 5.2* 5.4* 5.0*  ALBUMIN 2.1* 2.1* 2.2* 2.3* 2.2*   No results found for this basename: LIPASE, AMYLASE,  in the last 168 hours No results found for this basename: AMMONIA,  in the last 168 hours  CBC:  Recent Labs Lab 10/26/13 0420  10/28/13 0413 10/29/13 0515 10/30/13 0500 10/31/13 0555 11/01/13 0914  WBC 10.0  < > 8.2 9.7 11.2* 12.5* 10.6*  NEUTROABS 7.7  --   --   --   --   --   --   HGB 9.1*  < > 9.0* 9.2* 9.5* 9.4* 8.8*  HCT 27.8*  < > 27.8* 28.5* 29.5* 29.5* 27.5*  MCV 93.3  < > 93.6 93.4 94.9 95.5 94.8  PLT 153  < > 224 276 298 308 316  < > = values in this interval not displayed.  INR:  Recent Labs Lab 10/28/13 0413 10/29/13 0515 10/30/13 0500 10/31/13 0555 11/01/13 0914  INR 2.52* 2.53* 2.57* 3.06* 3.06*   LDH 659 < 726>603>493>458>410  Other results:  EKG:   Imaging: Dg Chest Port 1 View  11/01/2013   CLINICAL DATA:  Ventricular assist devices  EXAM: PORTABLE CHEST - 1 VIEW  COMPARISON:  October 31, 2013.  FINDINGS: The lungs are reasonably well inflated. However, increased density has developed at the left lung base partially obscuring the costophrenic angle. The cardiopericardial silhouette remains enlarged. The pulmonary vascularity is mildly prominent. The pulmonary interstitial markings remain mildly increased. The permanent pacemaker defibrillator appears unchanged in position. The PICC line on the right has its tip in the proximal portion of the SVC.  IMPRESSION: Since yesterday's study the appearance of the left lung base is deteriorated somewhat suggesting accumulation of pleural fluid and/or development of atelectasis. There remains low-grade pulmonary interstitial edema.   Electronically Signed   By: David  [place]   On: 11/01/2013 08:03   Dg Chest Port 1  View  10/31/2013   CLINICAL DATA:  CHF.  EXAM: PORTABLE CHEST - 1 VIEW  COMPARISON:  October 30, 2013.  FINDINGS: The cardiopericardial silhouette remains enlarged. The pulmonary vascularity is less prominent today. The pulmonary interstitial markings also have improved. Small amounts of pleural fluid remain at the lung bases. The permanent pacemaker  defibrillator appears to be in reasonable position. The mediastinum is not abnormally widened. The right-sided PICC line has its tip in the region of proximal SVC. The mediastinum is normal in width.  IMPRESSION: There has been further interval improvement in the pulmonary interstitium consistent with resolving CHF. There remains density in the retrocardiac region and tiny amounts of pleural fluid bilaterally.   Electronically Signed   By: David  [place]   On: 10/31/2013 08:56   Dg Chest Port 1 View  10/30/2013   CLINICAL DATA:  CHF and worsening cough  EXAM: PORTABLE CHEST - 1 VIEW  COMPARISON:  October 28, 2013.  FINDINGS: The cardiopericardial silhouette is enlarged. The pulmonary vascularity is mildly engorged in the interstitial markings are increased especially on the left. There may be a trace of pleural fluid blunting the costophrenic angles. The permanent pacemaker defibrillator is unchanged in position. There is no alveolar infiltrate. There is no pneumothorax.  IMPRESSION: The findings are consistent with congestive heart failure with mild pulmonary interstitial edema. No alveolar pneumonia is demonstrated.   Electronically Signed   By: David  [place]   On: 10/30/2013 17:32     Medications:     Scheduled Medications: . amiodarone  200 mg Oral Daily  . amoxicillin-clavulanate  1 tablet Oral Q12H  . aspirin EC  325 mg Oral Daily  . bisacodyl  10 mg Oral Daily   Or  . bisacodyl  10 mg Rectal Daily  . docusate sodium  200 mg Oral Daily  . feeding supplement (ENSURE COMPLETE)  237 mL Oral BID BM  . ferrous gluconate  324 mg Oral BID WC  .  guaiFENesin  600 mg Oral BID  . midodrine  5 mg Oral TID WC  . pantoprazole  40 mg Oral Daily  . [START ON 11/02/2013] torsemide  20 mg Oral Daily  . vancomycin  750 mg Intravenous Q12H  . warfarin  1 mg Oral ONCE-1800  . Warfarin - Physician Dosing Inpatient   Does not apply q1800    Infusions: . sodium chloride 20 mL/hr at 10/22/13 0800  . sodium chloride Stopped (10/28/13 0800)  . sodium chloride 250 mL (10/20/13 0600)    PRN Medications: ondansetron (ZOFRAN) IV, sodium chloride, sodium chloride, traMADol   Assessment:   1) Chronic systolic HF, Class IV, EF 20-25%  --s/p HM II VAD implant 12/15  2) NICM/ICM  3) Recurrent Vtach  - ablation 09/2011 and 06/2013  4) Atrial flutter  - s/p DC-CV 10/15/2013  5) CAD with recanalized occluded RCA with left to right collat.  6) PAD: s/p aorto-bifem, bilateral renal artery bypass, bilateral fem-pop, and left CEA.  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 9) postop expected blood loss anemia   Plan/Discussion:     Med adjustments- metoprolol stopped' demedex held until am  Pump speed decreased to 8400 rpm  Postop RV dysfunction is interfering with PT, rehab so will consider adding low dose Mil if he does not progress after metoprolol off  Acute blood loss anemia: slight drop to 8.8 Hgb, on po Fe- INR sl high WBC with slight increase   Day # 3 Vanc,Zosyn- change to po Augmentin to decrease fluid intake  INR 3.1 will redose coumadin 1 mg today  Patient now  Better  and getting up w/o orthostasis- doing well on midodrine    Cont VAD training, education.Cont PT as tolerated  I reviewed the LVAD parameters from today, and compared the results to the  patient's prior recorded data.  No programming changes were made.  The LVAD is functioning within specified parameters.  The patient performs LVAD self-test daily.  LVAD interrogation was negative for any significant power  changes, alarms or PI events/speed drops.  LVAD equipment check completed and is in good working order.  Back-up equipment present.   LVAD education done on emergency procedures and precautions and reviewed exit site care.  Length of Stay: 14  VAN TRIGT III,[name] 11/01/2013, 10:41 AM

## 2013-11-01 NOTE — Progress Notes (Addendum)
Patient ID: [name], male   DOB: 1941/05/29, 72 y.o.   MRN: [id number]  HeartMate 2 Rounding Note  Subjective:    [name] is a 72 yo man with an ischemic CM s/p ICD implant, chronic systolic HF (EF 20-25%), paroxysmal VT s/p ablation (09/2011) and repeat (06/2013), CAD, PVD (s/p R fem pop BPG 01/2010; s/p L CFA BPG 05/2002), AAA (s/p repair 04/2002), CAS (s/p L CEA 2008), HTN, arthritis, and TIA. Recently admitted 12/8-12/11/14 for syncopal episode from inappropriate ATP which accelerated SVT>>VT with failed ATP and then spontaneous termination of VT. During hospital stay had repeat RHC, hemodynamics were stable and had TEE-DC-CV of AFL 10/15/13 which was successful. He was presented to our [facility] team and felt to be good LVAD candidate and then presented at [facility] transplant listing conference and they felt he was not a candidate for transplant, but good candidate for LVAD under DT criteria.   POD 13 HeartMate II LVAD  On 12/26 had episode of nausea, vomiting and hypotension. Felt pale and weak. PI dropped. Episodes of NSVT. Pump speed decreased to 8600. Started on broad spectrum abx. Also placed on low-dose torsemide due to worsening edema. Co-ox ok at 68%   Yesterday PI was low again. Pump speed decreased to 8400. Able to walk 1 loop around circle without problems. However at night PI low again and MAP 58 so given 250 cc NS and diuretics stopped. Co-ox 62%.  Echo 12/27: LV is small with EF 15%. RV not significantly dilated but severely HK.  Feels ok. Not dizzy. Breathing ok. MAP 70 this am.   Remains AF in 80-90s. INR 2.5-> 3.1 -> pending WBC now decreasing 8.2->9.7->11.2-> (abx started) -> 12.5->10.6 LDH 440  12/28: INR and BMET: pending  UA (12/26): ok BCx (12/26) : NGTD CXR: resolving CHF   LVAD INTERROGATION:  HeartMate II LVAD:  Flow 4.6  liters/min, speed 8400, power 4.0, PI 5.0  Numerous PI events yesterday am. Now decreasing  Objective:    Vital Signs:   Temp:   [97.6 F (36.4 C)-98.4 F (36.9 C)] 98.4 F (36.9 C) (12/28 8657) Pulse Rate:  [74-90] 83 (12/28 0614) Resp:  [15-16] 15 (12/28 0614) SpO2:  [94 %-99 %] 98 % (12/28 0614) Weight:  [93.078 kg (205 lb 3.2 oz)] 93.078 kg (205 lb 3.2 oz) (12/28 0433) Last BM Date: 10/31/13  Intake/Output:   Intake/Output Summary (Last 24 hours) at 11/01/13 0925 Last data filed at 11/01/13 0430  Gross per 24 hour  Intake   1140 ml  Output    500 ml  Net    640 ml     Physical Exam: General: Sitting in chair. Looks better this am HEENT: normal  Neck: supple. JVP 10-12. Carotids 2+ bilat; no bruits. No lymphadenopathy or thryomegaly appreciated.  Cor: Faint heart sounds , LVAD hum present.  Lungs: clear  Abdomen: soft, nontender, nondistended. No hepatosplenomegaly. No bruits or masses. Active bowel sounds.  Driveline: C/D/I; securement device intact and driveline incorporated  Extremities: warm. TED hose in place  Tr-1+ edema Neuro: alert & orientedx3, cranial nerves grossly intact. moves all 4 extremities w/o difficulty. Affect pleasant    Telemetry: A-fib 80-90s  Labs: Basic Metabolic Panel:  Recent Labs Lab 10/25/13 2346 10/26/13 0420 10/27/13 0445 10/28/13 0413 10/29/13 0515 10/30/13 0500 10/31/13 0555  NA 133* 133* 134* 133* 132* 134* 132*  K 3.8 3.5 3.9 3.9 4.0 3.9 4.5  CL 97 97 99 98 97 98 98  CO2 29  28 28 27 25 28 27   GLUCOSE 112* 101* 114* 103* 97 106* 98  BUN 20 18 18 20 22 19 19   CREATININE 1.22 1.15 1.18 1.15 1.20 1.26 1.35  CALCIUM 7.9* 7.9* 7.9* 8.0* 8.2* 8.8 8.1*  MG 1.9 1.8  --   --   --   --   --   PHOS  --  2.7  --   --   --   --   --     Liver Function Tests:  Recent Labs Lab 10/26/13 0420 10/27/13 0445 10/28/13 0413 10/29/13 0515 10/30/13 0500  AST 19 33 69* 90* 85*  ALT 17 22 40 64* 76*  ALKPHOS 87 86 99 105 118*  BILITOT 0.6 0.6 0.7 0.7 0.8  PROT 4.9* 4.9* 5.1* 5.2* 5.4*  ALBUMIN 2.2* 2.1* 2.1* 2.2* 2.3*   No results found for this basename:  LIPASE, AMYLASE,  in the last 168 hours No results found for this basename: AMMONIA,  in the last 168 hours  CBC:  Recent Labs Lab 10/26/13 0420 10/27/13 0445 10/28/13 0413 10/29/13 0515 10/30/13 0500 10/31/13 0555  WBC 10.0 7.7 8.2 9.7 11.2* 12.5*  NEUTROABS 7.7  --   --   --   --   --   HGB 9.1* 9.1* 9.0* 9.2* 9.5* 9.4*  HCT 27.8* 27.2* 27.8* 28.5* 29.5* 29.5*  MCV 93.3 93.2 93.6 93.4 94.9 95.5  PLT 153 190 224 276 298 308    INR:  Recent Labs Lab 10/27/13 0445 10/28/13 0413 10/29/13 0515 10/30/13 0500 10/31/13 0555  INR 2.52* 2.52* 2.53* 2.57* 3.06*   LDH 659 < 726>603>493>458>410->->->->417  Other results:    Imaging: Dg Chest Port 1 View  11/01/2013   CLINICAL DATA:  Ventricular assist devices  EXAM: PORTABLE CHEST - 1 VIEW  COMPARISON:  October 31, 2013.  FINDINGS: The lungs are reasonably well inflated. However, increased density has developed at the left lung base partially obscuring the costophrenic angle. The cardiopericardial silhouette remains enlarged. The pulmonary vascularity is mildly prominent. The pulmonary interstitial markings remain mildly increased. The permanent pacemaker defibrillator appears unchanged in position. The PICC line on the right has its tip in the proximal portion of the SVC.  IMPRESSION: Since yesterday's study the appearance of the left lung base is deteriorated somewhat suggesting accumulation of pleural fluid and/or development of atelectasis. There remains low-grade pulmonary interstitial edema.   Electronically Signed   By: David  [place]   On: 11/01/2013 08:03   Dg Chest Port 1 View  10/31/2013   CLINICAL DATA:  CHF.  EXAM: PORTABLE CHEST - 1 VIEW  COMPARISON:  October 30, 2013.  FINDINGS: The cardiopericardial silhouette remains enlarged. The pulmonary vascularity is less prominent today. The pulmonary interstitial markings also have improved. Small amounts of pleural fluid remain at the lung bases. The permanent pacemaker  defibrillator appears to be in reasonable position. The mediastinum is not abnormally widened. The right-sided PICC line has its tip in the region of proximal SVC. The mediastinum is normal in width.  IMPRESSION: There has been further interval improvement in the pulmonary interstitium consistent with resolving CHF. There remains density in the retrocardiac region and tiny amounts of pleural fluid bilaterally.   Electronically Signed   By: David  [place]   On: 10/31/2013 08:56   Dg Chest Port 1 View  10/30/2013   CLINICAL DATA:  CHF and worsening cough  EXAM: PORTABLE CHEST - 1 VIEW  COMPARISON:  October 28, 2013.  FINDINGS: The  cardiopericardial silhouette is enlarged. The pulmonary vascularity is mildly engorged in the interstitial markings are increased especially on the left. There may be a trace of pleural fluid blunting the costophrenic angles. The permanent pacemaker defibrillator is unchanged in position. There is no alveolar infiltrate. There is no pneumothorax.  IMPRESSION: The findings are consistent with congestive heart failure with mild pulmonary interstitial edema. No alveolar pneumonia is demonstrated.   Electronically Signed   By: David  [place]   On: 10/30/2013 17:32     Medications:     Scheduled Medications: . amiodarone  200 mg Oral Daily  . aspirin EC  325 mg Oral Daily  . bisacodyl  10 mg Oral Daily   Or  . bisacodyl  10 mg Rectal Daily  . docusate sodium  200 mg Oral Daily  . feeding supplement (ENSURE COMPLETE)  237 mL Oral BID BM  . ferrous gluconate  324 mg Oral BID WC  . guaiFENesin  600 mg Oral BID  . metoprolol tartrate  12.5 mg Oral BID  . midodrine  5 mg Oral TID WC  . pantoprazole  40 mg Oral Daily  . piperacillin-tazobactam (ZOSYN)  IV  3.375 g Intravenous Q8H  . sodium chloride  10-40 mL Intracatheter Q12H  . vancomycin  750 mg Intravenous Q12H  . Warfarin - Physician Dosing Inpatient   Does not apply q1800    Infusions: . sodium chloride 20 mL/hr at  10/22/13 0800  . sodium chloride Stopped (10/28/13 0800)  . sodium chloride 250 mL (10/20/13 0600)    PRN Medications: ondansetron (ZOFRAN) IV, oxyCODONE, sodium chloride, sodium chloride, traMADol   Assessment:   1) Chronic systolic HF, Class IV, EF 20-25%  --s/p HM II VAD implant 12/15  2) NICM/ICM  3) Recurrent Vtach  - ablation 09/2011 and 06/2013  4) Atrial flutter  - s/p DC-CV 10/15/2013  5) CAD with recanalized occluded RCA with left to right collat.  6) PAD: s/p aorto-bifem, bilateral renal artery bypass, bilateral fem-pop, and left CEA.  7) RV failure    Plan/Discussion:    Somewhat better today but still tenuous. RV looks bad on echo but co-ox ok. Will need to let CVP rise and tolerate a bit of peripheral edema.(He is still 17 pounds up from baseline). Watch for worsening dyspnea. Will start low-dose po demadex 20 daily tomorrow. If co-ox falls will need inotropic support. Will stop metoprolol. Continue low-dose amio.   Keep pump at 8400.   Spoke with EP yesterday who recommended quinidine if VT returns. Will not start at this point.   Coumadin cut back to 1mg  yesterday (from 2.5 daily) due to rise with abx. Await INR to redose today. Given low VAD speed need to try to keep INR therapeutic.   Will continue midodrine for now. If PI events continue may need RHC next week. No role for ramp echo at this point.    Continue to work with Cardiac Rehab.  I reviewed the LVAD parameters from today, and compared the results to the patient's prior recorded data.  No programming changes were made.  The LVAD is functioning within specified parameters.  The patient performs LVAD self-test daily.  LVAD interrogation was negative for any significant power changes, alarms or PI events/speed drops.  LVAD equipment check completed and is in good working order.  Back-up equipment present.   LVAD education done on emergency procedures and precautions and reviewed exit site care.  Length of  Stay: 14  Luvinia Lucy MD 11/01/2013,  9:25 AM

## 2013-11-01 NOTE — Progress Notes (Signed)
Nursing note Patient ambulated in hallway x1 assist with walker, back up equipment and 02, 150 feet with some complaints of shortness of breath. Back in room call bell within reach. Karren Newland, [employer]

## 2013-11-02 ENCOUNTER — Inpatient Hospital Stay (HOSPITAL_COMMUNITY): Payer: Medicare Other

## 2013-11-02 DIAGNOSIS — I5023 Acute on chronic systolic (congestive) heart failure: Secondary | ICD-10-CM

## 2013-11-02 DIAGNOSIS — I4891 Unspecified atrial fibrillation: Secondary | ICD-10-CM

## 2013-11-02 DIAGNOSIS — Z95818 Presence of other cardiac implants and grafts: Secondary | ICD-10-CM

## 2013-11-02 LAB — BASIC METABOLIC PANEL
BUN: 15 mg/dL (ref 6–23)
CO2: 27 mEq/L (ref 19–32)
Calcium: 8 mg/dL — ABNORMAL LOW (ref 8.4–10.5)
Chloride: 97 mEq/L (ref 96–112)
Creatinine, Ser: 1.27 mg/dL (ref 0.50–1.35)
GFR calc Af Amer: 63 mL/min — ABNORMAL LOW (ref >90)
GFR calc non Af Amer: 54 mL/min — ABNORMAL LOW (ref >90)
Glucose, Bld: 106 mg/dL — ABNORMAL HIGH (ref 70–99)
Potassium: 3.6 mEq/L (ref 3.5–5.1)
Sodium: 133 mEq/L — ABNORMAL LOW (ref 135–145)

## 2013-11-02 LAB — CBC
Hemoglobin: 9.1 g/dL — ABNORMAL LOW (ref 13.0–17.0)
MCH: 30.8 pg (ref 26.0–34.0)
MCHC: 32.4 g/dL (ref 30.0–36.0)
MCV: 95.3 fL (ref 78.0–100.0)
Platelets: 339 10*3/uL (ref 150–400)
RDW: 17.9 % — ABNORMAL HIGH (ref 11.5–15.5)
WBC: 10.5 10*3/uL (ref 4.0–10.5)

## 2013-11-02 LAB — LACTATE DEHYDROGENASE: LDH: 406 U/L — ABNORMAL HIGH (ref 94–250)

## 2013-11-02 LAB — CARBOXYHEMOGLOBIN: Total hemoglobin: 9 g/dL — ABNORMAL LOW (ref 13.5–18.0)

## 2013-11-02 MED ORDER — DIGOXIN 125 MCG PO TABS
0.1250 mg | ORAL_TABLET | Freq: Every day | ORAL | Status: DC
  Administered 2013-11-03 – 2013-11-08 (×6): 0.125 mg via ORAL
  Filled 2013-11-02 (×7): qty 1

## 2013-11-02 MED ORDER — DIGOXIN 250 MCG PO TABS
0.2500 mg | ORAL_TABLET | Freq: Once | ORAL | Status: AC
  Administered 2013-11-02: 0.25 mg via ORAL
  Filled 2013-11-02: qty 1

## 2013-11-02 MED ORDER — WARFARIN SODIUM 2 MG PO TABS
2.0000 mg | ORAL_TABLET | Freq: Once | ORAL | Status: AC
  Administered 2013-11-02: 2 mg via ORAL
  Filled 2013-11-02: qty 1

## 2013-11-02 MED ORDER — AMIODARONE HCL 200 MG PO TABS
200.0000 mg | ORAL_TABLET | Freq: Two times a day (BID) | ORAL | Status: DC
  Administered 2013-11-02 – 2013-11-18 (×33): 200 mg via ORAL
  Filled 2013-11-02 (×34): qty 1

## 2013-11-02 MED ORDER — RANOLAZINE ER 500 MG PO TB12
500.0000 mg | ORAL_TABLET | Freq: Two times a day (BID) | ORAL | Status: DC
  Administered 2013-11-02 – 2013-11-18 (×32): 500 mg via ORAL
  Filled 2013-11-02 (×33): qty 1

## 2013-11-02 MED ORDER — ACETAMINOPHEN 325 MG PO TABS
650.0000 mg | ORAL_TABLET | Freq: Four times a day (QID) | ORAL | Status: DC | PRN
  Administered 2013-11-02: 650 mg via ORAL
  Filled 2013-11-02: qty 2

## 2013-11-02 MED ORDER — POTASSIUM CHLORIDE CRYS ER 20 MEQ PO TBCR
40.0000 meq | EXTENDED_RELEASE_TABLET | Freq: Once | ORAL | Status: AC
  Administered 2013-11-02: 40 meq via ORAL
  Filled 2013-11-02: qty 2

## 2013-11-02 NOTE — Progress Notes (Signed)
Patient ID: TATEM FESLER, male   DOB: 11-14-1940, 72 y.o.   MRN: [id number]  HeartMate 2 Rounding Note  Subjective:    Bufford Helms is a 72 yo man with an ischemic CM s/p ICD implant, chronic systolic HF (EF 20-25%), paroxysmal VT s/p ablation (09/2011) and repeat (06/2013), CAD, PVD (s/p R fem pop BPG 01/2010; s/p L CFA BPG 05/2002), AAA (s/p repair 04/2002), CAS (s/p L CEA 2008), HTN, arthritis, and TIA. Recently admitted 12/8-12/11/14 for syncopal episode from inappropriate ATP which accelerated SVT>>VT with failed ATP and then spontaneous termination of VT. During hospital stay had repeat RHC, hemodynamics were stable and had TEE-DC-CV of AFL 10/15/13 which was successful. He was presented to our [facility] team and felt to be good LVAD candidate and then presented at [facility] transplant listing conference and they felt he was not a candidate for transplant, but good candidate for LVAD under DT criteria.   POD  14 HeartMate II LVAD  Now off lopressor due to RV function.  Co-ox 64%  No nausea. Appetite fair.  Just walked 250 ft and very short of breath by half way through the walk. PI dropped from 6 to 3 intially, then recovered.  UA (12/26): ok BCx (12/26) : NGTD WBC decreased to 10.5  LVAD INTERROGATION:  HeartMate II LVAD:  Flow 4.9 liters/min, speed 8400, power 4.5, PI 6.0.  2 PI events overnight. Objective:    Vital Signs:   Temp:  [98.2 F (36.8 C)-99.6 F (37.6 C)] 98.2 F (36.8 C) (12/29 0604) Pulse Rate:  [88-101] 95 (12/29 0604) Resp:  [15-16] 16 (12/29 0604) SpO2:  [90 %-100 %] 90 % (12/29 0604) Weight:  [93.532 kg (206 lb 3.2 oz)] 93.532 kg (206 lb 3.2 oz) (12/29 0604) Last BM Date: 10/31/13 Mean arterial Pressure 80  Intake/Output:   Intake/Output Summary (Last 24 hours) at 11/02/13 1222 Last data filed at 11/02/13 1100  Gross per 24 hour  Intake    540 ml  Output    950 ml  Net   -410 ml     Physical Exam:  General: Awake and alert. Conversant. He looks pale  and tired. HEENT: normal  Neck: supple.  Carotids 2+ bilat; no bruits. No lymphadenopathy or thryomegaly appreciated.  Cor: Faint heart sounds , LVAD hum present.  Lungs: clear but decreased breath sounds at left base.  Abdomen: soft, nontender, nondistended. No hepatosplenomegaly. No bruits or masses. Active bowel sounds.  Driveline: C/D/I; securement device intact and driveline incorporated  Extremities: mild peripheral edema   Neuro: alert & orientedx3, cranial nerves grossly intact. moves all 4 extremities w/o difficulty. Affect pleasant     Telemetry: A-fib.  HR up to 120-130 with walking and wider complex.  Labs: Basic Metabolic Panel:  Recent Labs Lab 10/29/13 0515 10/30/13 0500 10/31/13 0555 11/01/13 0914 11/02/13 0500  NA 132* 134* 132* 132* 133*  K 4.0 3.9 4.5 3.8 3.6  CL 97 98 98 97 97  CO2 25 28 27 28 27   GLUCOSE 97 106* 98 136* 106*  BUN 22 19 19 18 15   CREATININE 1.20 1.26 1.35 1.38* 1.27  CALCIUM 8.2* 8.8 8.1* 8.0* 8.0*  MG  --   --   --   --  1.9    Liver Function Tests:  Recent Labs Lab 10/27/13 0445 10/28/13 0413 10/29/13 0515 10/30/13 0500 11/01/13 0914  AST 33 69* 90* 85* 31  ALT 22 40 64* 76* 40  ALKPHOS 86 99 105 118* 107  BILITOT 0.6 0.7 0.7 0.8 0.7  PROT 4.9* 5.1* 5.2* 5.4* 5.0*  ALBUMIN 2.1* 2.1* 2.2* 2.3* 2.2*   No results found for this basename: LIPASE, AMYLASE,  in the last 168 hours No results found for this basename: AMMONIA,  in the last 168 hours  CBC:  Recent Labs Lab 10/29/13 0515 10/30/13 0500 10/31/13 0555 11/01/13 0914 11/02/13 0500  WBC 9.7 11.2* 12.5* 10.6* 10.5  HGB 9.2* 9.5* 9.4* 8.8* 9.1*  HCT 28.5* 29.5* 29.5* 27.5* 28.1*  MCV 93.4 94.9 95.5 94.8 95.3  PLT 276 298 308 316 339    INR:  Recent Labs Lab 10/29/13 0515 10/30/13 0500 10/31/13 0555 11/01/13 0914 11/02/13 0500  INR 2.53* 2.57* 3.06* 3.06* 2.68*   LDH 406 stable Other results:  EKG:   Imaging: Dg Chest Port 1 View  11/02/2013    CLINICAL DATA:  Left ventricular assist device.  EXAM: PORTABLE CHEST - 1 VIEW  COMPARISON:  11/01/2013.  FINDINGS: Left ventricular assist device again noted. Cardiac pacer noted with lead tips projected over the right atrium and right ventricle. PICC line in stable position. Prior median sternotomy. Persistent cardiomegaly. No pulmonary venous congestion. Persistent left lower lobe infiltrate and effusion remain. No pneumothorax.  IMPRESSION: 1. Stable appearance of left ventricular assist device and cardiac pacer. PICC line in stable position. 2. Persistent left lower lobe infiltrate and left pleural effusion. 3. Stable cardiomegaly. Prior median sternotomy. No pulmonary venous congestion.   Electronically Signed   By: Maisie Fus  Register   On: 11/02/2013 07:54   Dg Chest Port 1 View  11/01/2013   CLINICAL DATA:  Ventricular assist devices  EXAM: PORTABLE CHEST - 1 VIEW  COMPARISON:  October 31, 2013.  FINDINGS: The lungs are reasonably well inflated. However, increased density has developed at the left lung base partially obscuring the costophrenic angle. The cardiopericardial silhouette remains enlarged. The pulmonary vascularity is mildly prominent. The pulmonary interstitial markings remain mildly increased. The permanent pacemaker defibrillator appears unchanged in position. The PICC line on the right has its tip in the proximal portion of the SVC.  IMPRESSION: Since yesterday's study the appearance of the left lung base is deteriorated somewhat suggesting accumulation of pleural fluid and/or development of atelectasis. There remains low-grade pulmonary interstitial edema.   Electronically Signed   By: David  [place]   On: 11/01/2013 08:03      Medications:     Scheduled Medications: . amiodarone  200 mg Oral BID  . amoxicillin-clavulanate  1 tablet Oral Q12H  . aspirin EC  325 mg Oral Daily  . bisacodyl  10 mg Oral Daily   Or  . bisacodyl  10 mg Rectal Daily  . docusate sodium  200 mg Oral  Daily  . feeding supplement (ENSURE COMPLETE)  237 mL Oral BID BM  . ferrous gluconate  324 mg Oral BID WC  . guaiFENesin  600 mg Oral BID  . midodrine  5 mg Oral TID WC  . pantoprazole  40 mg Oral Daily  . torsemide  20 mg Oral Daily  . Warfarin - Physician Dosing Inpatient   Does not apply q1800     Infusions: . sodium chloride 20 mL/hr at 10/22/13 0800  . sodium chloride Stopped (10/28/13 0800)  . sodium chloride 250 mL (10/20/13 0600)     PRN Medications:  acetaminophen, ondansetron (ZOFRAN) IV, sodium chloride, sodium chloride, traMADol   Assessment:   1) Chronic systolic HF, Class IV, EF 20-25%  --s/p HM II VAD implant  12/15  2) NICM/ICM  3) Recurrent Vtach  - ablation 09/2011 and 06/2013  4) Atrial flutter  - s/p DC-CV 10/15/2013  5) CAD with recanalized occluded RCA with left to right collat.  6) PAD: s/p aorto-bifem, bilateral renal artery bypass, bilateral fem-pop, and left CEA.  7) Severe RV dysfunction by 2D echo 10/31/2013    Plan/Discussion:    His hemodynamics are stable today although PI drops with standing and walking. He still gets very short of breath and tired with ambulation. It took him a long time to recover once back to room. He is also getting more tachy with exertion since off lopressor.  Weight is increasing and Demedex restarted. I am sure that some of his total body volume excess is third space loss due to low albumin of 2.2.  Echo reportedly showed severe RV dysfunction but his CVP always remained relatively low postop with Co-ox in the 60's.   He does have some left pleural effusion on CXR that could be contributing to his dyspnea. Since he is anticoagulated I would hold off on thoracentesis unless it progresses.  He is therapeutic on coumadin.  Continue to encourage nutrition, IS, PT.  I reviewed the LVAD parameters from today, and compared the results to the patient's prior recorded data.  No programming changes were made.  The LVAD  is functioning within specified parameters.  The patient performs LVAD self-test daily.  LVAD interrogation was negative for any significant power changes, alarms or PI events/speed drops.  LVAD equipment check completed and is in good working order.  Back-up equipment present.   LVAD education done on emergency procedures and precautions and reviewed exit site care.  Length of Stay: 15  BARTLE,BRYAN K 11/02/2013, 12:22 PM

## 2013-11-02 NOTE — Progress Notes (Signed)
CSW met with patient at bedside. [name] was sitting up in the chair and alert and oriented x3. He spoke of his recovery and pleased with progress so far. "I am feeling better than last time I saw you". Patient reports continued education and training with his SO [name] on his care and understanding of LVAD. Patient appears to be coping well with recovery and has good support from [name] SO who visits daily. CSW will continue to follow for support through recovery. Lasandra Beech, LCSW [phone].

## 2013-11-02 NOTE — Progress Notes (Signed)
Anticipate pt will d/c home when medically ready. Progressing well. [phone]

## 2013-11-02 NOTE — Progress Notes (Signed)
Patient ID: [name], male   DOB: May 08, 1941, 72 y.o.   MRN: [id number] Telemetry this afternoon reviewed with Dr. Graciela Husbands.  Suspect atrial fibrillation with aberrancy as the cause of wide complex rhythm at higher rates.   Will add digoxin for better rate control and hopefully for some RV inotropy.    Discussed re-attempting cardioversion with Dr. Graciela Husbands.  Will add ranolazine as adjunct to amiodarone.  Will plan TEE-guided DCCV attempt on Wednesday.   Marca Ancona 11/02/2013 5:59 PM

## 2013-11-02 NOTE — Progress Notes (Signed)
Patient ID: [name], male   DOB: January 18, 1941, 72 y.o.   MRN: [id number]  HeartMate 2 Rounding Note  Subjective:    [name] is a 72 yo man with an ischemic CM s/p ICD implant, chronic systolic HF (EF 20-25%), paroxysmal VT s/p ablation (09/2011) and repeat (06/2013), CAD, PVD (s/p R fem pop BPG 01/2010; s/p L CFA BPG 05/2002), AAA (s/p repair 04/2002), CAS (s/p L CEA 2008), HTN, arthritis, and TIA. Recently admitted 12/8-12/11/14 for syncopal episode from inappropriate ATP which accelerated SVT>>VT with failed ATP and then spontaneous termination of VT. During hospital stay had repeat RHC, hemodynamics were stable and had TEE-DC-CV of AFL 10/15/13 which was successful. He was presented to our [facility] team and felt to be good LVAD candidate and then presented at [facility] transplant listing conference and they felt he was not a candidate for transplant, but good candidate for LVAD under DT criteria.   POD 13 HeartMate II LVAD  On 12/26 had episode of nausea, vomiting and hypotension. Felt pale and weak. PI dropped. Episodes of NSVT. Pump speed decreased to 8600. Started on broad spectrum abx. Also placed on low-dose torsemide due to worsening edema. Co-ox ok at 68%   Yesterday PI was low again. Pump speed decreased to 8400. Able to walk 1 loop around circle without problems. However at night PI low again and MAP 58 so given 250 cc NS and diuretics stopped. Co-ox 62%.  Echo 12/27: LV is small with EF 15%. RV not significantly dilated but severely HK.  Feels ok. Not dizzy. Breathing ok. MAP 70s currently.  PI better today.  PVCs overnight, now only very rare PVCs.  He does not feel them.  Remains AF in 90s-100s (now off metoprolol). INR 2.5-> 3.1 -> 2.7 WBC now decreasing 8.2->9.7->11.2-> (abx started) -> 12.5->10.6->10.5 LDH 406 (stable) Co-ox 64%  UA (12/26): ok BCx (12/26) : NGTD  LVAD INTERROGATION:  HeartMate II LVAD:  Flow 4.9  liters/min, speed 8400, power 4.5, PI 6.3.   There were 2  PI events overnight (less)  Objective:    Vital Signs:   Temp:  [98.2 F (36.8 C)-99.6 F (37.6 C)] 98.2 F (36.8 C) (12/29 0604) Pulse Rate:  [88-101] 95 (12/29 0604) Resp:  [15-16] 16 (12/29 0604) SpO2:  [90 %-100 %] 90 % (12/29 0604) Weight:  [93.532 kg (206 lb 3.2 oz)] 93.532 kg (206 lb 3.2 oz) (12/29 0604) Last BM Date: 10/31/13  Intake/Output:   Intake/Output Summary (Last 24 hours) at 11/02/13 0835 Last data filed at 11/02/13 0455  Gross per 24 hour  Intake   1020 ml  Output    200 ml  Net    820 ml     Physical Exam: General: Sitting in chair. Looks better this am HEENT: normal  Neck: supple. JVP 10-12. Carotids 2+ bilat; no bruits. No lymphadenopathy or thryomegaly appreciated.  Cor: Faint heart sounds , LVAD hum present.  Lungs: clear  Abdomen: soft, nontender, nondistended. No hepatosplenomegaly. No bruits or masses. Active bowel sounds.  Driveline: C/D/I; securement device intact and driveline incorporated  Extremities: warm. TED hose in place  Tr-1+ edema Neuro: alert & orientedx3, cranial nerves grossly intact. moves all 4 extremities w/o difficulty. Affect pleasant    Telemetry: A-fib 80-90s  Labs: Basic Metabolic Panel:  Recent Labs Lab 10/29/13 0515 10/30/13 0500 10/31/13 0555 11/01/13 0914 11/02/13 0500  NA 132* 134* 132* 132* 133*  K 4.0 3.9 4.5 3.8 3.6  CL 97 98 98 97 97  CO2 25 28 27 28 27   GLUCOSE 97 106* 98 136* 106*  BUN 22 19 19 18 15   CREATININE 1.20 1.26 1.35 1.38* 1.27  CALCIUM 8.2* 8.8 8.1* 8.0* 8.0*  MG  --   --   --   --  1.9    Liver Function Tests:  Recent Labs Lab 10/27/13 0445 10/28/13 0413 10/29/13 0515 10/30/13 0500 11/01/13 0914  AST 33 69* 90* 85* 31  ALT 22 40 64* 76* 40  ALKPHOS 86 99 105 118* 107  BILITOT 0.6 0.7 0.7 0.8 0.7  PROT 4.9* 5.1* 5.2* 5.4* 5.0*  ALBUMIN 2.1* 2.1* 2.2* 2.3* 2.2*   No results found for this basename: LIPASE, AMYLASE,  in the last 168 hours No results found for this  basename: AMMONIA,  in the last 168 hours  CBC:  Recent Labs Lab 10/29/13 0515 10/30/13 0500 10/31/13 0555 11/01/13 0914 11/02/13 0500  WBC 9.7 11.2* 12.5* 10.6* 10.5  HGB 9.2* 9.5* 9.4* 8.8* 9.1*  HCT 28.5* 29.5* 29.5* 27.5* 28.1*  MCV 93.4 94.9 95.5 94.8 95.3  PLT 276 298 308 316 339    INR:  Recent Labs Lab 10/29/13 0515 10/30/13 0500 10/31/13 0555 11/01/13 0914 11/02/13 0500  INR 2.53* 2.57* 3.06* 3.06* 2.68*   LDH 659 < 726>603>493>458>410->->->->417  Other results:    Imaging: Dg Chest Port 1 View  11/02/2013   CLINICAL DATA:  Left ventricular assist device.  EXAM: PORTABLE CHEST - 1 VIEW  COMPARISON:  11/01/2013.  FINDINGS: Left ventricular assist device again noted. Cardiac pacer noted with lead tips projected over the right atrium and right ventricle. PICC line in stable position. Prior median sternotomy. Persistent cardiomegaly. No pulmonary venous congestion. Persistent left lower lobe infiltrate and effusion remain. No pneumothorax.  IMPRESSION: 1. Stable appearance of left ventricular assist device and cardiac pacer. PICC line in stable position. 2. Persistent left lower lobe infiltrate and left pleural effusion. 3. Stable cardiomegaly. Prior median sternotomy. No pulmonary venous congestion.   Electronically Signed   By: Maisie Fus  Register   On: 11/02/2013 07:54   Dg Chest Port 1 View  11/01/2013   CLINICAL DATA:  Ventricular assist devices  EXAM: PORTABLE CHEST - 1 VIEW  COMPARISON:  October 31, 2013.  FINDINGS: The lungs are reasonably well inflated. However, increased density has developed at the left lung base partially obscuring the costophrenic angle. The cardiopericardial silhouette remains enlarged. The pulmonary vascularity is mildly prominent. The pulmonary interstitial markings remain mildly increased. The permanent pacemaker defibrillator appears unchanged in position. The PICC line on the right has its tip in the proximal portion of the SVC.   IMPRESSION: Since yesterday's study the appearance of the left lung base is deteriorated somewhat suggesting accumulation of pleural fluid and/or development of atelectasis. There remains low-grade pulmonary interstitial edema.   Electronically Signed   By: David  [place]   On: 11/01/2013 08:03     Medications:     Scheduled Medications: . amiodarone  200 mg Oral BID  . amoxicillin-clavulanate  1 tablet Oral Q12H  . aspirin EC  325 mg Oral Daily  . bisacodyl  10 mg Oral Daily   Or  . bisacodyl  10 mg Rectal Daily  . docusate sodium  200 mg Oral Daily  . feeding supplement (ENSURE COMPLETE)  237 mL Oral BID BM  . ferrous gluconate  324 mg Oral BID WC  . guaiFENesin  600 mg Oral BID  . midodrine  5 mg Oral TID WC  .  pantoprazole  40 mg Oral Daily  . potassium chloride  40 mEq Oral Once  . torsemide  20 mg Oral Daily  . vancomycin  750 mg Intravenous Q12H  . Warfarin - Physician Dosing Inpatient   Does not apply q1800    Infusions: . sodium chloride 20 mL/hr at 10/22/13 0800  . sodium chloride Stopped (10/28/13 0800)  . sodium chloride 250 mL (10/20/13 0600)    PRN Medications: acetaminophen, ondansetron (ZOFRAN) IV, sodium chloride, sodium chloride, traMADol   Assessment:   1) Chronic systolic HF, Class IV, EF 20-25%  --s/p HM II VAD implant 12/15  2) NICM/ICM  3) Recurrent Vtach  - ablation 09/2011 and 06/2013  4) Atrial flutter  - s/p DC-CV 10/15/2013  5) CAD with recanalized occluded RCA with left to right collat.  6) PAD: s/p aorto-bifem, bilateral renal artery bypass, bilateral fem-pop, and left CEA.  7) RV failure    Plan/Discussion:    Some improvement now with higher PI and stable co-ox, now off metoprolol.  Continue current regimen with pump speed 8400 rpm and use of midodrine.  Fewer PI events now.  RV dysfunction will continue to be an issue.   Will restart torsemide 20 mg daily today (up another 1 lb, some dyspnea with ambulation).   Some PVCs  overnight, now less.  Of note, metoprolol stopped yesterday.  Would increase amiodarone to 200 mg bid.   Coumadin cut back to 1mg  yesterday (from 2.5 daily) due to rise with abx. INR ok today. Given low VAD speed need to try to keep INR therapeutic.   Will continue midodrine for now.  No role for ramp echo at this point.    Continue to work with Cardiac Rehab.  I reviewed the LVAD parameters from today, and compared the results to the patient's prior recorded data.  No programming changes were made.  The LVAD is functioning within specified parameters.  The patient performs LVAD self-test daily.  LVAD interrogation was negative for any significant power changes, alarms or PI events/speed drops.  LVAD equipment check completed and is in good working order.  Back-up equipment present.   LVAD education done on emergency procedures and precautions and reviewed exit site care.  Length of Stay: 15  Marca Ancona MD 11/02/2013, 8:35 AM

## 2013-11-02 NOTE — Progress Notes (Signed)
CARDIAC REHAB PHASE I   PRE:  Rate/Rhythm: 95 afib    BP: sitting 80    SaO2: 99 2L  MODE:  Ambulation: 150 ft   POST:  Rate/Rhythm: 127 afib    BP: sitting 82     SaO2: wouldn't register  Pt eager to walk. Legs weak but improving with standing. Sts rocking to stand doesn't help him. PI down to 4.8 with standing, stable. Walked around circle. Asked pt to stop and rest x2. Continued to c/o SOB, did not want to increase distance this am. Sts he will this pm with PT. HR 120s afib. PI stable at 5.2 while walking. Back to 6.5 with sitting. MAP stable. Only c/o was SOB. While sitting in recliner, trying to disconnect batteries pt had increased rate to 133 with WCT. No c/o from pt. Resolved within 1 min. RN present, to give meds. [id number]  Elissa Lovett [place] CES, ACSM 11/02/2013 9:58 AM

## 2013-11-02 NOTE — Progress Notes (Signed)
Physical Therapy Treatment Patient Details Name: [name] MRN: [id number] DOB: Jul 29, 1941 Today's Date: 11/02/2013 Time: 4098-1191 PT Time Calculation (min): 38 min  PT Assessment / Plan / Recommendation  History of Present Illness Pt admit for LVAD placement.     PT Comments   Pt admitted with above. Pt currently with functional limitations due to balance and endurance deficits.  Pt will benefit from skilled PT to increase their independence and safety with mobility to allow discharge to the venue listed below.   Follow Up Recommendations  Home health PT;Supervision/Assistance - 24 hour                 Equipment Recommendations  None recommended by PT        Frequency Min 5X/week   Progress towards PT Goals Progress towards PT goals: Progressing toward goals  Plan Current plan remains appropriate    Precautions / Restrictions Precautions Precautions: Sternal;Fall Precaution Comments: LVAD; closely monitor PI as dropping when changing positions Required Braces or Orthoses: Other Brace/Splint Other Brace/Splint: strap for controller; vest for batteries; needs to take bag with back up supplies with him whenever he leaves his room Restrictions Weight Bearing Restrictions: Yes Other Position/Activity Restrictions: sternal precautions   Pertinent Vitals/Pain VSS with HR from 117-145 bpm at times.  PI 6.0 down to 3.6 initially with ambulation and then to 5.6 with ambulation.  Needed 4LO2 to ambulate with sats 96% with 4LO2 with DOEs 3/4.  Decr back to 2LO2 at rest with O2 sats 94%.  No pain per pt.    Mobility  Bed Mobility Bed Mobility: Not assessed Transfers Transfers: Sit to Stand;Stand to Sit Sit to Stand: 1: +2 Total assist Sit to Stand: Patient Percentage: 70% Stand to Sit: 4: Min assist Details for Transfer Assistance: Pt fatigued. could not stand with +1 assist x 2 tries; on 3rd attempt with +2 was able to stand Ambulation/Gait Ambulation/Gait Assistance:  4: Min assist Ambulation Distance (Feet): 248 Feet Assistive device:  (pushing wheelchair) Ambulation/Gait Assistance Details: As pt fatigued, posture with incr trunk and hip and knee flexion.  Pt reports feeling weaker as well.  Pt also with DOE originally 1/4 and incr to 3/4 toward end of walk.  Pt at 2LO2 initially and incr to 4LO2  with ambulation due to DOE.  O2 sats after ambulation 94% once pt rested for 1 min.  PI initially was 6.0.  Dropped to 3.6 and then varied in the high 4's to 5's during ambulation.  PI 6.9 on departure.   Gait Pattern: Within Functional Limits Stairs: No Wheelchair Mobility Wheelchair Mobility: No    Exercises Other Exercises Other Exercises: Pt required no cues for gathering all supplies to prepare for switching to batteries and going to walk ; pt  using neck strap for controller to allow access to screen for monitoring PI numbers while up; pt required no vc throughout switch over for sequencing.    PT Goals (current goals can now be found in the care plan section)    Visit Information  Last PT Received On: 11/02/13 Assistance Needed: +1 History of Present Illness: Pt admit for LVAD placement.      Subjective Data  Subjective: "I just get so SOB."   Cognition  Cognition Arousal/Alertness: Awake/alert Behavior During Therapy: WFL for tasks assessed/performed Overall Cognitive Status: Within Functional Limits for tasks assessed    Balance  Static Standing Balance Static Standing - Balance Support: No upper extremity supported;During functional activity Static Standing - Level  of Assistance: 5: Stand by assistance;4: Min assist Static Standing - Comment/# of Minutes: min guard to stand by assist static stance in front of recliner to adjust gown Dynamic Standing Balance Dynamic Standing - Balance Support: Bilateral upper extremity supported;During functional activity Dynamic Standing - Level of Assistance: 4: Min assist Dynamic Standing - Balance  Activities: Forward lean/weight shifting;Lateral lean/weight shifting;Reaching across midline  End of Session PT - End of Session Equipment Utilized During Treatment: Gait belt;Oxygen Activity Tolerance: Patient tolerated treatment well Patient left: in chair;with call bell/phone within reach;with nursing/sitter in room Nurse Communication: Mobility status       [name],Yifan Auker 11/02/2013, 1:24 PM [facility] Acute Rehabilitation [phone] (pager)

## 2013-11-02 NOTE — Progress Notes (Signed)
Patient started having runs of PVCs, entered to check on patient and he was using his urinal. Patient has had many frequent runs of PVCs overnight and is asymptomatic with heart rate in the upper 90s to low 110s. Patient has a low grade temperature ranging 99-99.6.  Notified Molly RN about frequent PVCs and temp; orders received for tylenol 650mg  po. Rechecked temperature and it was 98.9. PI event noted on LVAD at 0138.  Called the physician on call for Dr. Gala Romney and I received a callback from Dr. Leeann Must. He was made aware of the events and orders received to add on a magnesium level in the morning; patient already has a daily BMET. Notified central telemetry and updated them. Will continue to monitor.

## 2013-11-03 DIAGNOSIS — Z95818 Presence of other cardiac implants and grafts: Secondary | ICD-10-CM

## 2013-11-03 DIAGNOSIS — I5023 Acute on chronic systolic (congestive) heart failure: Secondary | ICD-10-CM

## 2013-11-03 LAB — CBC
HCT: 29 % — ABNORMAL LOW (ref 39.0–52.0)
Hemoglobin: 9.4 g/dL — ABNORMAL LOW (ref 13.0–17.0)
MCHC: 32.4 g/dL (ref 30.0–36.0)
Platelets: 346 10*3/uL (ref 150–400)
RBC: 3.04 MIL/uL — ABNORMAL LOW (ref 4.22–5.81)
RDW: 17.8 % — ABNORMAL HIGH (ref 11.5–15.5)
WBC: 11.3 10*3/uL — ABNORMAL HIGH (ref 4.0–10.5)

## 2013-11-03 LAB — CARBOXYHEMOGLOBIN
Carboxyhemoglobin: 2.1 % — ABNORMAL HIGH (ref 0.5–1.5)
Methemoglobin: 0.6 % (ref 0.0–1.5)
Methemoglobin: 0.1 % (ref 0.0–1.5)
O2 Saturation: 57.1 %
O2 Saturation: 51.7 %
O2 Saturation: 80.1 %
Total hemoglobin: 9.5 g/dL — ABNORMAL LOW (ref 13.5–18.0)
Total hemoglobin: 15.8 g/dL (ref 13.5–18.0)

## 2013-11-03 LAB — BASIC METABOLIC PANEL
BUN: 14 mg/dL (ref 6–23)
CO2: 28 mEq/L (ref 19–32)
Calcium: 8.2 mg/dL — ABNORMAL LOW (ref 8.4–10.5)
Chloride: 96 mEq/L (ref 96–112)
Creatinine, Ser: 1.21 mg/dL (ref 0.50–1.35)
GFR calc Af Amer: 66 mL/min — ABNORMAL LOW (ref >90)
GFR calc non Af Amer: 57 mL/min — ABNORMAL LOW (ref >90)
Glucose, Bld: 100 mg/dL — ABNORMAL HIGH (ref 70–99)
Potassium: 4.1 mEq/L (ref 3.7–5.3)
Sodium: 134 mEq/L — ABNORMAL LOW (ref 137–147)

## 2013-11-03 LAB — PROTIME-INR
INR: 2.24 — ABNORMAL HIGH (ref 0.00–1.49)
Prothrombin Time: 24.1 seconds — ABNORMAL HIGH (ref 11.6–15.2)

## 2013-11-03 MED ORDER — WARFARIN - PHARMACIST DOSING INPATIENT
Freq: Every day | Status: DC
  Administered 2013-11-04 – 2013-11-06 (×3)

## 2013-11-03 MED ORDER — SODIUM CHLORIDE 0.9 % IJ SOLN
3.0000 mL | Freq: Two times a day (BID) | INTRAMUSCULAR | Status: DC
  Administered 2013-11-09: 3 mL via INTRAVENOUS

## 2013-11-03 MED ORDER — WARFARIN SODIUM 2 MG PO TABS
2.0000 mg | ORAL_TABLET | Freq: Once | ORAL | Status: AC
  Administered 2013-11-03: 2 mg via ORAL
  Filled 2013-11-03: qty 1

## 2013-11-03 MED ORDER — VANCOMYCIN HCL IN DEXTROSE 750-5 MG/150ML-% IV SOLN
750.0000 mg | Freq: Two times a day (BID) | INTRAVENOUS | Status: DC
  Administered 2013-11-03: 750 mg via INTRAVENOUS
  Filled 2013-11-03 (×2): qty 150

## 2013-11-03 MED ORDER — MAGNESIUM SULFATE 40 MG/ML IJ SOLN
2.0000 g | Freq: Once | INTRAMUSCULAR | Status: AC
  Administered 2013-11-03: 2 g via INTRAVENOUS
  Filled 2013-11-03: qty 50

## 2013-11-03 MED ORDER — MILRINONE IN DEXTROSE 20 MG/100ML IV SOLN
0.2500 ug/kg/min | INTRAVENOUS | Status: DC
  Administered 2013-11-03 – 2013-11-05 (×3): 0.125 ug/kg/min via INTRAVENOUS
  Administered 2013-11-06 – 2013-11-10 (×8): 0.25 ug/kg/min via INTRAVENOUS
  Administered 2013-11-11: 0.125 ug/kg/min via INTRAVENOUS
  Administered 2013-11-11: 0.25 ug/kg/min via INTRAVENOUS
  Administered 2013-11-11: 0.125 ug/kg/min via INTRAVENOUS
  Administered 2013-11-12 – 2013-11-18 (×9): 0.25 ug/kg/min via INTRAVENOUS
  Filled 2013-11-03 (×24): qty 100

## 2013-11-03 MED ORDER — SODIUM CHLORIDE 0.9 % IV SOLN: 250.0000 mL | INTRAVENOUS | Status: DC

## 2013-11-03 MED ORDER — SODIUM CHLORIDE 0.9 % IJ SOLN: 3.0000 mL | INTRAMUSCULAR | Status: DC | PRN

## 2013-11-03 NOTE — Progress Notes (Signed)
  Well healed and incorporated. The velour is fully implanted at exit site. Dressing dry and intact. No erythema or drainage. Stabilization device present and accurately applied. Driveline dressing is being changed weekly per sterile technique using gauze dressing and aquacel strip / Sorbaview dressing with biopatch on exit site. Pt denies fever or chills. Pt/caregiver state they have adequate dressing supplies at home.    The driveline dressing is dry and intact. Dressing removed; driveline velour is fully implanted at exit site; one suture removed. Dressing change per VAD coordinator using sterile technique; site  cleansed with Chlora prep applicators x 2, dried, and guaze dressing with aquacel strip re-applied.The site has minimal tissue ingrowth, remains erythematous under aquacel strip with small amount tan drainage. No foul odor or tenderness noted, anchor device in place.   Driveline dressing is being changed daily; caregiver is participating in dressing changes under nursing supervision.  Assisted patient back to chair; PI drop from 6 to 3, but patient asymptomatic. Stood patient for several minutes; PI back up to 4. Encouraged patient to participate with physical therapy today if possible; take his time with orthostatic changes.

## 2013-11-03 NOTE — Progress Notes (Signed)
ANTIBIOTIC CONSULT NOTE - INITIAL  Pharmacy Consult for Vancomycin  Indication: empiric antibiotics for possible drive line infection / pna  Allergies  Allergen Reactions  . Demerol [Meperidine]     Paralysis. Could only move eyes.  . Meperidine Hcl Other (See Comments)    "CAN'T MOVE" CATATONIC PER PT.  [name] Opium Other (See Comments)    "CAN'T MOVE" CATATONIC PER PT.    Patient Measurements: Height: 6' (182.9 cm) Weight: 204 lb 2.3 oz (92.6 kg) (b) IBW/kg (Calculated) : 77.6   Vital Signs: Temp: 98.4 F (36.9 C) (12/30 0600) Temp src: Oral (12/30 0600) Pulse Rate: 101 (12/30 0600) Intake/Output from previous day: 12/29 0701 - 12/30 0700 In: 360 [P.O.:360] Out: 1800 [Urine:1800] Intake/Output from this shift:    Labs:  Recent Labs  11/01/13 0914 11/02/13 0500 11/03/13 0455  WBC 10.6* 10.5 11.3*  HGB 8.8* 9.1* 9.4*  PLT 316 339 346  CREATININE 1.38* 1.27 1.21   Estimated Creatinine Clearance: 58.8 ml/min (by C-G formula based on Cr of 1.21). No results found for this basename: Rolm Gala, VANCORANDOM, GENTTROUGH, GENTPEAK, GENTRANDOM, TOBRATROUGH, TOBRAPEAK, TOBRARND, AMIKACINPEAK, AMIKACINTROU, AMIKACIN,  in the last 72 hours   Microbiology: Recent Results (from the past 720 hour(s))  URINE CULTURE     Status: None   Collection Time    10/12/13 10:43 AM      Result Value Range Status   Specimen Description URINE, RANDOM   Final   Special Requests Normal   Final   Culture  Setup Time     Final   Value: 10/12/2013 15:19     Performed at [facility] Count     Final   Value: 60,000 COLONIES/ML     Performed at [facility]   Culture     Final   Value: STAPHYLOCOCCUS SPECIES (COAGULASE NEGATIVE)     Note: RIFAMPIN AND GENTAMICIN SHOULD NOT BE USED AS SINGLE DRUGS FOR TREATMENT OF STAPH INFECTIONS.     Performed at [facility]   Report Status 10/14/2013 FINAL   Final   Organism ID, Bacteria STAPHYLOCOCCUS  SPECIES (COAGULASE NEGATIVE)   Final  MRSA PCR SCREENING     Status: None   Collection Time    10/18/13 11:31 AM      Result Value Range Status   MRSA by PCR NEGATIVE  NEGATIVE Final   Comment:            The GeneXpert MRSA Assay (FDA     approved for NASAL specimens     only), is one component of a     comprehensive MRSA colonization     surveillance program. It is not     intended to diagnose MRSA     infection nor to guide or     monitor treatment for     MRSA infections.  URINE CULTURE     Status: None   Collection Time    10/26/13  9:43 AM      Result Value Range Status   Specimen Description URINE, CATHETERIZED   Final   Special Requests Normal   Final   Culture  Setup Time     Final   Value: 10/26/2013 14:13     Performed at [facility] Count     Final   Value: NO GROWTH     Performed at [facility]   Culture     Final   Value: NO GROWTH  Performed at [facility]   Report Status 10/27/2013 FINAL   Final  CULTURE, BLOOD (ROUTINE X 2)     Status: None   Collection Time    10/30/13  4:05 PM      Result Value Range Status   Specimen Description BLOOD ARM LEFT   Final   Special Requests BOTTLES DRAWN AEROBIC ONLY 1CC   Final   Culture  Setup Time     Final   Value: 10/31/2013 00:10     Performed at [facility]   Culture     Final   Value:        BLOOD CULTURE RECEIVED NO GROWTH TO DATE CULTURE WILL BE HELD FOR 5 DAYS BEFORE ISSUING A FINAL NEGATIVE REPORT     Performed at [facility]   Report Status PENDING   Incomplete  CULTURE, BLOOD (ROUTINE X 2)     Status: None   Collection Time    10/30/13  5:00 PM      Result Value Range Status   Specimen Description BLOOD ARM LEFT   Final   Special Requests BOTTLES DRAWN AEROBIC ONLY 10CC   Final   Culture  Setup Time     Final   Value: 10/31/2013 00:10     Performed at [facility]   Culture     Final   Value:        BLOOD CULTURE RECEIVED NO GROWTH TO  DATE CULTURE WILL BE HELD FOR 5 DAYS BEFORE ISSUING A FINAL NEGATIVE REPORT     Performed at [facility]   Report Status PENDING   Incomplete    Medical History: Past Medical History  Diagnosis Date  . CAD (coronary artery disease)     s/p MI 1982;  s/p DES to CFX 2011;  s/p NSTEMI 4/12 in setting of VTach;  cath 7/12:  LM ok, LAD mid 30-40%, Dx's with 40%, mCFX stent patent, prox to mid RCA occluded with R to R and L to R collats.  Medical Tx was continued  . Bradycardia   . Ventricular tachycardia     s/p AICD;   h/o ICD shock;  Amiodarone Rx. S/P ablation in 2012  . 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 HTN (hypertension)   . HLD (hyperlipidemia)   . Cytopenia   . Hypothyroidism   . Renal insufficiency   . Claudication   . Chronic systolic heart failure   . Ischemic cardiomyopathy     echo 7/12:  EF 25-30%, mild AI, mod MR, mod LAE  . TIA (transient ischemic attack) 2001  . CVD (cerebrovascular disease)     left CEA 2008  . Stroke   . Anemia   . Inappropriate therapy from implantable cardioverter-defibrillator 04/02/2013    ATP for sinus tach  SVT wavelet identified SVT but was no  passive   . Myocardial infarction 1992  . Anginal pain   . Automatic implantable cardioverter-defibrillator in situ     [employer]onic Evera device serial number [id number]    . Sleep apnea     denies wearing mask (05/25/2013)  . Arthritis     "a little in my knees" (05/25/2013)  . Melanoma of ear 2013    "near left ear" (05/25/2013)    Assessment: 72yom  with Hx HF s/p LVAD placement 12/15.  He developed some redness and discharge around drive line, Cxr LLL opacity with pleural effusion, elevated WBC and low grade fever 99.1.  Vancomycin and Zosyn were started and then changed to Augmentin.  WBC increased again today.  Plan to restart IV abx.  Goal of  Therapy:  Vancomycin trough level 15-20 mcg/ml  Plan:  Stop Augmentin  Restart vancomycin 750mg  q12  Leota Sauers Pharm.D. CPP, BCPS Clinical Pharmacist [phone] 11/03/2013 8:38 AM     Marcelino Scot 11/03/2013,8:24 AM

## 2013-11-03 NOTE — Progress Notes (Signed)
Patient ID: [name], male   DOB: November 27, 1940, 72 y.o.   MRN: [id number]  Difficult day.  PI dropping with standing (ok while sitting).  Co-ox remains low.  I am going to hold torsemide to maximize RV preload and restart milrinone to try to maximize RV output.  I am going to start milrinone at a minimal dose and increase as needed to get Co-ox up.  Would like to keep at low dose as I still want to try to cardiovert him tomorrow. Plan for TEE-guided DCCV in am.  Will aim for RHC on Friday if we are still having problems with output.   Marca Ancona 11/03/2013 5:39 PM

## 2013-11-03 NOTE — Progress Notes (Signed)
OT Cancellation Note  Patient Details Name: [name] MRN: [id number] DOB: 11-Dec-1940   Cancelled Treatment:    Reason Eval/Treat Not Completed: Medical issues which prohibited therapy - Pt refused due to fatigue and PI event earlier.  Will reattempt.   [name] M [phone] 11/03/2013, 11:00 AM

## 2013-11-03 NOTE — Progress Notes (Signed)
PT Cancellation Note  Patient Details Name: [name] MRN: [id number] DOB: Jun 01, 1941   Cancelled Treatment:      Reason Eval/Treat Not Completed: Medical issues which prohibited therapy - PI event earlier.  Will re-attempt    Fabio Asa 11/03/2013, 11:34 AM

## 2013-11-03 NOTE — Progress Notes (Signed)
Patient with nausea, zofran given as ordered. Will monitor patient MAP 78. Keyonta Barradas, Randall An RN

## 2013-11-03 NOTE — Progress Notes (Signed)
Pt MAP 68, Milrinone infusing as ordered. Dr Shirlee Latch made aware, no new orders received at this time will continue to monitor patient. Tabor Denham, Randall An RN

## 2013-11-03 NOTE — Progress Notes (Signed)
Patient ID: DENVER BENTSON, male   DOB: 1941/08/13, 72 y.o.   MRN: [id number]  HeartMate 2 Rounding Note  Subjective:    [name] is a 72 yo man with an ischemic CM s/p ICD implant, chronic systolic HF (EF 20-25%), paroxysmal VT s/p ablation (09/2011) and repeat (06/2013), CAD, PVD (s/p R fem pop BPG 01/2010; s/p L CFA BPG 05/2002), AAA (s/p repair 04/2002), CAS (s/p L CEA 2008), HTN, arthritis, and TIA. Recently admitted 12/8-12/11/14 for syncopal episode from inappropriate ATP which accelerated SVT>>VT with failed ATP and then spontaneous termination of VT. During hospital stay had repeat RHC, hemodynamics were stable and had TEE-DC-CV of AFL 10/15/13 which was successful. He was presented to our [facility] team and felt to be good LVAD candidate and then presented at [facility] transplant listing conference and they felt he was not a candidate for transplant, but good candidate for LVAD under DT criteria.   POD 13 HeartMate II LVAD  On 12/26 had episode of nausea, vomiting and hypotension. Felt pale and weak. PI dropped. Episodes of NSVT. Pump speed decreased to 8600. Started on broad spectrum abx. Also placed on low-dose torsemide due to worsening edema. Co-ox ok at 68%   Yesterday, patient was short of breath when walking but his 2nd walk was significantly better than his first walk.  He lost 2 lbs yesterday after starting torsemide 20 mg daily and is breathing better.  I started him on digoxin yesterday for rate control and RV support.  I also started him on ranolazine to use as adjunct to amiodarone for DCCV attempt. Co-ox today down from 64% => 57%.  MAP 70s.  HR 90s-100s in atrial fibrillation.  As noted yesterday, he does not appear to have had VT.  He has aberrantly conducted atrial fibrillation when HR increases.   Echo 12/27: LV is small with EF 15%. RV not significantly dilated but severely HK.  Remains AF in 90s-100s (now off metoprolol). INR 2.5-> 3.1 -> 2.7->2.2 WBC now decreasing  8.2->9.7->11.2-> (abx started) -> 12.5->10.6->10.5->11 LDH 398 Co-ox 64%->57%  UA (12/26): ok BCx (12/26) : NGTD  LVAD INTERROGATION:  HeartMate II LVAD:  Flow 4.9  liters/min, speed 8400, power 4.2, PI 6.8.   There were 5 PI events this morning after having none during the day yesterday.   Objective:    Vital Signs:   Temp:  [98 F (36.7 C)-99.3 F (37.4 C)] 98.4 F (36.9 C) (12/30 0600) Pulse Rate:  [98-108] 101 (12/30 0600) Resp:  [16-18] 16 (12/30 0600) SpO2:  [91 %-92 %] 91 % (12/30 0600) Weight:  [92.6 kg (204 lb 2.3 oz)] 92.6 kg (204 lb 2.3 oz) (12/30 0600) Last BM Date: 10/31/13  Intake/Output:   Intake/Output Summary (Last 24 hours) at 11/03/13 0721 Last data filed at 11/02/13 2200  Gross per 24 hour  Intake    360 ml  Output   1800 ml  Net  -1440 ml     Physical Exam: General: Sitting in chair. Looks better this am HEENT: normal  Neck: supple. JVP 10. Carotids 2+ bilat; no bruits. No lymphadenopathy or thryomegaly appreciated.  Cor: Faint heart sounds , LVAD hum present.  Lungs: Decreased BS at left base.   Abdomen: soft, nontender, nondistended. No hepatosplenomegaly. No bruits or masses. Active bowel sounds.  Driveline: C/D/I; securement device intact and driveline incorporated  Extremities: warm. TED hose in place  Tr-1+ edema Neuro: alert & orientedx3, cranial nerves grossly intact. moves all 4 extremities w/o difficulty. Affect  pleasant    Telemetry: A-fib 80-90s  Labs: Basic Metabolic Panel:  Recent Labs Lab 10/30/13 0500 10/31/13 0555 11/01/13 0914 11/02/13 0500 11/03/13 0455  NA 134* 132* 132* 133* 134*  K 3.9 4.5 3.8 3.6 4.1  CL 98 98 97 97 96  CO2 28 27 28 27 28   GLUCOSE 106* 98 136* 106* 100*  BUN 19 19 18 15 14   CREATININE 1.26 1.35 1.38* 1.27 1.21  CALCIUM 8.8 8.1* 8.0* 8.0* 8.2*  MG  --   --   --  1.9  --     Liver Function Tests:  Recent Labs Lab 10/28/13 0413 10/29/13 0515 10/30/13 0500 11/01/13 0914  AST 69* 90*  85* 31  ALT 40 64* 76* 40  ALKPHOS 99 105 118* 107  BILITOT 0.7 0.7 0.8 0.7  PROT 5.1* 5.2* 5.4* 5.0*  ALBUMIN 2.1* 2.2* 2.3* 2.2*   No results found for this basename: LIPASE, AMYLASE,  in the last 168 hours No results found for this basename: AMMONIA,  in the last 168 hours  CBC:  Recent Labs Lab 10/30/13 0500 10/31/13 0555 11/01/13 0914 11/02/13 0500 11/03/13 0455  WBC 11.2* 12.5* 10.6* 10.5 11.3*  HGB 9.5* 9.4* 8.8* 9.1* 9.4*  HCT 29.5* 29.5* 27.5* 28.1* 29.0*  MCV 94.9 95.5 94.8 95.3 95.4  PLT 298 308 316 339 346    INR:  Recent Labs Lab 10/30/13 0500 10/31/13 0555 11/01/13 0914 11/02/13 0500 11/03/13 0455  INR 2.57* 3.06* 3.06* 2.68* 2.24*   LDH 659 < 726>603>493>458>410->->->->417  Other results:    Imaging: Dg Chest Port 1 View  11/02/2013   CLINICAL DATA:  Left ventricular assist device.  EXAM: PORTABLE CHEST - 1 VIEW  COMPARISON:  11/01/2013.  FINDINGS: Left ventricular assist device again noted. Cardiac pacer noted with lead tips projected over the right atrium and right ventricle. PICC line in stable position. Prior median sternotomy. Persistent cardiomegaly. No pulmonary venous congestion. Persistent left lower lobe infiltrate and effusion remain. No pneumothorax.  IMPRESSION: 1. Stable appearance of left ventricular assist device and cardiac pacer. PICC line in stable position. 2. Persistent left lower lobe infiltrate and left pleural effusion. 3. Stable cardiomegaly. Prior median sternotomy. No pulmonary venous congestion.   Electronically Signed   By: Maisie Fus  Register   On: 11/02/2013 07:54     Medications:     Scheduled Medications: . amiodarone  200 mg Oral BID  . amoxicillin-clavulanate  1 tablet Oral Q12H  . aspirin EC  325 mg Oral Daily  . bisacodyl  10 mg Oral Daily   Or  . bisacodyl  10 mg Rectal Daily  . digoxin  0.125 mg Oral Daily  . docusate sodium  200 mg Oral Daily  . feeding supplement (ENSURE COMPLETE)  237 mL Oral BID BM   . ferrous gluconate  324 mg Oral BID WC  . guaiFENesin  600 mg Oral BID  . midodrine  5 mg Oral TID WC  . pantoprazole  40 mg Oral Daily  . ranolazine  500 mg Oral BID  . torsemide  20 mg Oral Daily  . Warfarin - Pharmacist Dosing Inpatient   Does not apply q1800    Infusions: . sodium chloride 20 mL/hr at 10/22/13 0800  . sodium chloride Stopped (10/28/13 0800)  . sodium chloride 250 mL (10/20/13 0600)    PRN Medications: acetaminophen, ondansetron (ZOFRAN) IV, sodium chloride, sodium chloride, traMADol   Assessment:   1) Chronic systolic HF, Class IV, EF 20-25%  --s/p  HM II VAD implant 12/15  2) NICM/ICM  3) Recurrent Vtach  - ablation 09/2011 and 06/2013  4) Atrial flutter  - s/p DC-CV 10/15/2013  - Atrial fibrillation recurred post-op.  5) CAD with recanalized occluded RCA with left to right collat.  6) PAD: s/p aorto-bifem, bilateral renal artery bypass, bilateral fem-pop, and left CEA.  7) RV failure    Plan/Discussion:    PI remains >6 but he had 5 PI events this morning (after none during the day yesterday).  MAP remains stable.  Co-ox is lower at 57%.  RV dysfunction continues to be an issue. Yesterday, I started him on digoxin to help with both rate control and RV systolic function.    Atrial fibrillation continues to be present with higher rate off metoprolol.  He is now on bid amiodarone and I started him on ranolazine 500 bid after discussion with Dr. Graciela Husbands to use as adjunct with amiodarone to try to maintain NSR.  Will re-attempt DCCV (will use TEE guidance) tomorrow. I hope overall output can improve in NSR.  If co-ox remains low or DCCV fails, will then plan RHC with possible milrinone initiation though milrinone will be difficult in this situation as it can drive atrial arrhythmias.   Continue torsemide 20 daily.  Weight is down and breathing seems better this morning.  If he has more PI events, may need to decrease dosing to every other day.   No VT.   Patient has been having atrial fibrillation with aberrancy when HR rises.   INR ok today. Given low VAD speed need to try to keep INR therapeutic.   Will continue midodrine for now.  No role for ramp echo at this point.    Continue to work with Cardiac Rehab.  I reviewed the LVAD parameters from today, and compared the results to the patient's prior recorded data.  No programming changes were made.  The LVAD is functioning within specified parameters.  The patient performs LVAD self-test daily.  LVAD interrogation was negative for any significant power changes, alarms or PI events/speed drops.  LVAD equipment check completed and is in good working order.  Back-up equipment present.   LVAD education done on emergency procedures and precautions and reviewed exit site care.  Length of Stay: 16  Marca Ancona MD 11/03/2013, 7:21 AM

## 2013-11-03 NOTE — Progress Notes (Signed)
CARDIAC REHAB PHASE I   PRE:  Rate/Rhythm: 100 afib    BP: sitting 68, PI 7.0    SaO2: 93 2L  MODE:  Ambulation: stood x2   POST:  Rate/Rhythm: 120s with aberrancy, PI down to 2.9, 3.3    BP: sitting 72     SaO2:    Pt with increased HR, wider complex switching to batteries. Pt attempted to walk. Needed mod assist with gait belt to stand (failed x1). Used rocking technique. Upon standing PI dropped from 7.0 to 2.9 then up to 4.0 range. Complexes wider, rate 120s.  Stood about 45 sec. Pt decided to sit and rest. PI up 6.5 with rest. Attempted again, PI down to 4.2 with wider complexes, rate still 120s. Pt frustrated and decided to sit, not to walk. Was standing about 30 sec. Assisted pt back to power (so he could rest some). MAP stable. HR lower and narrow complex with rest/inactivity. Will f/u tomorrow. PT to see in pm. 1004-1100  Elissa Lovett [place] CES, ACSM 11/03/2013 10:43 AM

## 2013-11-03 NOTE — Progress Notes (Signed)
Physical Therapy Treatment Patient Details Name: [name] MRN: [id number] DOB: 1941/02/06 Today's Date: 11/03/2013 Time: 4098-1191 PT Time Calculation (min): 34 min  PT Assessment / Plan / Recommendation  History of Present Illness Pt admit for LVAD placement.     PT Comments   Patient ambulated in hall this afternoon. Patient still with PI drops throughout activity (see vitals). Utilized new harness today, patient reports this is more comfortable for him. Will continue to see and progress as tolerated.   Follow Up Recommendations  Home health PT;Supervision/Assistance - 24 hour           Equipment Recommendations  None recommended by PT       Frequency Min 5X/week   Progress towards PT Goals Progress towards PT goals: Progressing toward goals  Plan Current plan remains appropriate    Precautions / Restrictions Precautions Precautions: Sternal;Fall Precaution Comments: LVAD; closely monitor PI as dropping when changing positions Required Braces or Orthoses: Other Brace/Splint Other Brace/Splint: strap for controller; vest for batteries; needs to take bag with back up supplies with him whenever he leaves his room Restrictions Weight Bearing Restrictions: Yes Other Position/Activity Restrictions: sternal precautions   Pertinent Vitals/Pain No pain at this time, PI 6.7 seated with legs elevated, 5.9 seated with legs dependent, dropped to 3.4 standing, and remained mid-3s to low 4's with ambulation. Patient DOE 3/4    Mobility  Bed Mobility Bed Mobility: Not assessed Transfers Transfers: Sit to Stand;Stand to Sit Sit to Stand: 1: +2 Total assist Sit to Stand: Patient Percentage: 70% Stand to Sit: 4: Min assist Details for Transfer Assistance: Pt fatigued. could not stand with +1 assist x 2 tries; on 3rd attempt with +2 was able to stand Ambulation/Gait Ambulation/Gait Assistance: 4: Min assist Ambulation Distance (Feet): 180 Feet Assistive device: Rolling  walker Ambulation/Gait Assistance Details: Patient required 4 standing rest breaks, PI checked ranged mid 3's to 4s. DOE 3/4 with ambulation on 4 liters.  Gait Pattern: Within Functional Limits Stairs: No Wheelchair Mobility Wheelchair Mobility: No    Exercises Other Exercises Other Exercises: Pt required no cues for gathering all supplies to prepare for switching to batteries and going to walk ; pt  using neck strap for controller to allow access to screen for monitoring PI numbers while up; pt required no vc throughout switch over for sequencing.  Used new harness today for batteries instead of vest, patient reports more comfort with harness.     PT Goals (current goals can now be found in the care plan section)    Visit Information  Last PT Received On: 11/03/13 Assistance Needed: +1 Reason Eval/Treat Not Completed: Medical issues which prohibited therapy History of Present Illness: Pt admit for LVAD placement.      Subjective Data  Subjective: "It has been a horrible day."   Cognition  Cognition Arousal/Alertness: Awake/alert Behavior During Therapy: WFL for tasks assessed/performed Overall Cognitive Status: Within Functional Limits for tasks assessed    Balance  Static Standing Balance Static Standing - Balance Support: No upper extremity supported;During functional activity Static Standing - Level of Assistance: 5: Stand by assistance;4: Min assist Static Standing - Comment/# of Minutes: min guard 3 minutes for PI to rebound upon initial standing, vitals monitored  End of Session PT - End of Session Equipment Utilized During Treatment: Gait belt;Oxygen Activity Tolerance: Patient tolerated treatment well Patient left: in chair;with call bell/phone within reach Nurse Communication: Mobility status   GP     Fabio Asa 11/03/2013, 2:33  PM Alben Deeds, [place] DPT  [phone]

## 2013-11-03 NOTE — Progress Notes (Signed)
ANTICOAGULATION CONSULT NOTE - Initial Consult  Pharmacy Consult for Coumadin Indication: atrial fibrillation / s/p LVAD 12/15  Allergies  Allergen Reactions  . Demerol [Meperidine]     Paralysis. Could only move eyes.  . Meperidine Hcl Other (See Comments)    "CAN'T MOVE" CATATONIC PER PT.  [name] Opium Other (See Comments)    "CAN'T MOVE" CATATONIC PER PT.    Patient Measurements: Height: 6' (182.9 cm) Weight: 204 lb 2.3 oz (92.6 kg) (b) IBW/kg (Calculated) : 77.6  Vital Signs: Temp: 98.4 F (36.9 C) (12/30 0600) Temp src: Oral (12/30 0600) Pulse Rate: 101 (12/30 0600)  Labs:  Recent Labs  11/01/13 0914 11/02/13 0500 11/03/13 0455  HGB 8.8* 9.1* 9.4*  HCT 27.5* 28.1* 29.0*  PLT 316 339 346  LABPROT 30.5* 27.6* 24.1*  INR 3.06* 2.68* 2.24*  CREATININE 1.38* 1.27 1.21    Estimated Creatinine Clearance: 58.8 ml/min (by C-G formula based on Cr of 1.21).   Medical History: Past Medical History  Diagnosis Date  . CAD (coronary artery disease)     s/p MI 1982;  s/p DES to CFX 2011;  s/p NSTEMI 4/12 in setting of VTach;  cath 7/12:  LM ok, LAD mid 30-40%, Dx's with 40%, mCFX stent patent, prox to mid RCA occluded with R to R and L to R collats.  Medical Tx was continued  . Bradycardia   . Ventricular tachycardia     s/p AICD;   h/o ICD shock;  Amiodarone Rx. S/P ablation in 2012  . 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 HTN (hypertension)   . HLD (hyperlipidemia)   . Cytopenia   . Hypothyroidism   . Renal insufficiency   . Claudication   . Chronic systolic heart failure   . Ischemic cardiomyopathy     echo 7/12:  EF 25-30%, mild AI, mod MR, mod LAE  . TIA (transient ischemic attack) 2001  . CVD (cerebrovascular disease)     left CEA 2008  . Stroke   . Anemia   . Inappropriate therapy from implantable cardioverter-defibrillator  04/02/2013    ATP for sinus tach  SVT wavelet identified SVT but was no  passive   . Myocardial infarction 1992  . Anginal pain   . Automatic implantable cardioverter-defibrillator in situ     [employer]onic Evera device serial number [id number]    . Sleep apnea     denies wearing mask (05/25/2013)  . Arthritis     "a little in my knees" (05/25/2013)  . Melanoma of ear 2013    "near left ear" (05/25/2013)      Assessment: 72yom with HF s/p LVAD placement 12/15.  Afib CVR 90s at rest but with runs of RVR up to 140s with walking.   Current tx amiodarone, digoxin and ranolazine. Plan for DCCV 12/31.  INR 2.24 today, cbc stable, no bleeding noted  Goal of Therapy:  INR 2-3 Monitor platelets by anticoagulation protocol: Yes   Plan:  Coumadin 2mg  x1 today Daily INR, CBC  [name] Pharm.D. [name], [name] Clinical Pharmacist [phone] 11/03/2013 7:31 AM     [name] 11/03/2013,7:27 AM

## 2013-11-03 NOTE — Progress Notes (Signed)
Patient ID: SRIANSH FARRA, male   DOB: Dec 01, 1940, 72 y.o.   MRN: [id number]  HeartMate 2 Rounding Note  Subjective:    [name] is a 72 yo man with an ischemic CM s/p ICD implant, chronic systolic HF (EF 20-25%), paroxysmal VT s/p ablation (09/2011) and repeat (06/2013), CAD, PVD (s/p R fem pop BPG 01/2010; s/p L CFA BPG 05/2002), AAA (s/p repair 04/2002), CAS (s/p L CEA 2008), HTN, arthritis, and TIA. Recently admitted 12/8-12/11/14 for syncopal episode from inappropriate ATP which accelerated SVT>>VT with failed ATP and then spontaneous termination of VT. During hospital stay had repeat RHC, hemodynamics were stable and had TEE-DC-CV of AFL 10/15/13 which was successful. He was presented to our [facility] team and felt to be good LVAD candidate and then presented at [facility] transplant listing conference and they felt he was not a candidate for transplant, but good candidate for LVAD under DT criteria.   POD 15 HeartMate II LVAD  He was started on digoxin yesterday for control of A-fib rate control. He has been having rapid a-fib with aberrancy and not VT.  He diuresed -1440 cc yesterday and weight decreased 2 lbs. Today he has continued to diurese.  Co-ox decreased to 57% today and he continues to be fatigued and weak with shortness of breath with walking and decreased PI with standing. Started on Milrinone 0.125 this afternoon to help RV and aid with turn around.   He is scheduled for DCCV tomorrow.  This afternoon suddenly developed nausea with no vomiting. Denies abdominal pain.  LVAD INTERROGATION:  HeartMate II LVAD:  Flow 4.9 liters/min, speed 8400, power 4.2, PI 6.8.  5 PI events this am. Objective:    Vital Signs:   Temp:  [98.2 F (36.8 C)-99.1 F (37.3 C)] 98.3 F (36.8 C) (12/30 2000) Pulse Rate:  [89-103] 101 (12/30 2000) Resp:  [16-18] 16 (12/30 0737) SpO2:  [91 %-94 %] 94 % (12/30 2000) Weight:  [92.6 kg (204 lb 2.3 oz)] 92.6 kg (204 lb 2.3 oz) (12/30 0600) Last BM Date:  11/03/13 Mean arterial Pressure 68  Intake/Output:   Intake/Output Summary (Last 24 hours) at 11/03/13 2015 Last data filed at 11/03/13 2005  Gross per 24 hour  Intake 325.83 ml  Output   1900 ml  Net -1574.17 ml     Physical Exam:  General: Awake and alert. Conversant. He looks pale and tired.  HEENT: normal  Neck: supple. Carotids 2+ bilat; no bruits. No lymphadenopathy or thryomegaly appreciated.  Cor: Faint heart sounds , LVAD hum present.  Lungs: clear but decreased breath sounds at left base.  Abdomen: soft, nontender, nondistended. No hepatosplenomegaly. No bruits or masses. Active bowel sounds.  Driveline: C/D/I; securement device intact and driveline incorporated  Extremities: mild peripheral edema  Neuro: alert & orientedx3, cranial nerves grossly intact. moves all 4 extremities w/o difficulty. Affect pleasant    Telemetry: A-fib  Labs: Basic Metabolic Panel:  Recent Labs Lab 10/30/13 0500 10/31/13 0555 11/01/13 0914 11/02/13 0500 11/03/13 0455  NA 134* 132* 132* 133* 134*  [name] 3.9 4.5 3.8 3.6 4.1  CL 98 98 97 97 96  CO2 28 27 28 27 28   GLUCOSE 106* 98 136* 106* 100*  BUN 19 19 18 15 14   CREATININE 1.26 1.35 1.38* 1.27 1.21  CALCIUM 8.8 8.1* 8.0* 8.0* 8.2*  MG  --   --   --  1.9  --     Liver Function Tests:  Recent Labs Lab 10/28/13 0413 10/29/13  0515 10/30/13 0500 11/01/13 0914  AST 69* 90* 85* 31  ALT 40 64* 76* 40  ALKPHOS 99 105 118* 107  BILITOT 0.7 0.7 0.8 0.7  PROT 5.1* 5.2* 5.4* 5.0*  ALBUMIN 2.1* 2.2* 2.3* 2.2*   No results found for this basename: LIPASE, AMYLASE,  in the last 168 hours No results found for this basename: AMMONIA,  in the last 168 hours  CBC:  Recent Labs Lab 10/30/13 0500 10/31/13 0555 11/01/13 0914 11/02/13 0500 11/03/13 0455  WBC 11.2* 12.5* 10.6* 10.5 11.3*  HGB 9.5* 9.4* 8.8* 9.1* 9.4*  HCT 29.5* 29.5* 27.5* 28.1* 29.0*  MCV 94.9 95.5 94.8 95.3 95.4  PLT 298 308 316 339 346    INR:  Recent  Labs Lab 10/30/13 0500 10/31/13 0555 11/01/13 0914 11/02/13 0500 11/03/13 0455  INR 2.57* 3.06* 3.06* 2.68* 2.24*   LDH 393 Other results:  EKG:   Imaging: Dg Chest Port 1 View  11/02/2013   CLINICAL DATA:  Left ventricular assist device.  EXAM: PORTABLE CHEST - 1 VIEW  COMPARISON:  11/01/2013.  FINDINGS: Left ventricular assist device again noted. Cardiac pacer noted with lead tips projected over the right atrium and right ventricle. PICC line in stable position. Prior median sternotomy. Persistent cardiomegaly. No pulmonary venous congestion. Persistent left lower lobe infiltrate and effusion remain. No pneumothorax.  IMPRESSION: 1. Stable appearance of left ventricular assist device and cardiac pacer. PICC line in stable position. 2. Persistent left lower lobe infiltrate and left pleural effusion. 3. Stable cardiomegaly. Prior median sternotomy. No pulmonary venous congestion.   Electronically Signed   By: Maisie Fus  Register   On: 11/02/2013 07:54      Medications:     Scheduled Medications: . amiodarone  200 mg Oral BID  . aspirin EC  325 mg Oral Daily  . bisacodyl  10 mg Oral Daily   Or  . bisacodyl  10 mg Rectal Daily  . digoxin  0.125 mg Oral Daily  . docusate sodium  200 mg Oral Daily  . feeding supplement (ENSURE COMPLETE)  237 mL Oral BID BM  . ferrous gluconate  324 mg Oral BID WC  . guaiFENesin  600 mg Oral BID  . midodrine  5 mg Oral TID WC  . pantoprazole  40 mg Oral Daily  . ranolazine  500 mg Oral BID  . sodium chloride  3 mL Intravenous Q12H  . Warfarin - Pharmacist Dosing Inpatient   Does not apply q1800     Infusions: . sodium chloride 20 mL/hr at 10/22/13 0800  . sodium chloride Stopped (10/28/13 0800)  . sodium chloride 250 mL (10/20/13 0600)  . sodium chloride    . milrinone 0.125 mcg/kg/min (11/03/13 1620)     PRN Medications:  acetaminophen, ondansetron (ZOFRAN) IV, sodium chloride, sodium chloride, sodium chloride,  traMADol   Assessment:   1) Chronic systolic HF, Class IV, EF 20-25%  --s/p HM II VAD implant 12/15  2) NICM/ICM  3) Recurrent Vtach  - ablation 09/2011 and 06/2013  4) Atrial flutter  - s/p DC-CV 10/15/2013  5) CAD with recanalized occluded RCA with left to right collat.  6) PAD: s/p aorto-bifem, bilateral renal artery bypass, bilateral fem-pop, and left CEA.  7) Severe RV dysfunction by 2D echo 10/31/2013    Plan/Discussion:    He has had marginal hemodynamics with weakness, shortness of breath and fatigue and is not progressing. His Co-ox is down today and it is felt that this is all due to  RV dysfunction. Hopefuly the milrinone will improve his RV enough to allow him to recover. He will have DCCV tomorrow but may not maintain sinus given his history. His nausea may be due to the amiodarone since dose was just increased after Lopressor stopped.  I reviewed the LVAD parameters from today, and compared the results to the patient's prior recorded data.  No programming changes were made.  The LVAD is functioning within specified parameters.  The patient performs LVAD self-test daily.  LVAD interrogation was negative for any significant power changes, alarms or PI events/speed drops.  LVAD equipment check completed and is in good working order.  Back-up equipment present.   LVAD education done on emergency procedures and precautions and reviewed exit site care.  Length of Stay: 16  [name] 11/03/2013, 8:15 PM

## 2013-11-04 ENCOUNTER — Encounter (HOSPITAL_COMMUNITY)
Admission: AD | Disposition: A | Discharge status: Home Health | Payer: Self-pay | Source: Ambulatory Visit | Attending: Surgery

## 2013-11-04 ENCOUNTER — Encounter (HOSPITAL_COMMUNITY): Payer: Medicare Other | Admitting: Anesthesiology

## 2013-11-04 ENCOUNTER — Inpatient Hospital Stay (HOSPITAL_COMMUNITY): Payer: Medicare Other | Admitting: Anesthesiology

## 2013-11-04 ENCOUNTER — Encounter (HOSPITAL_COMMUNITY): Payer: Self-pay | Admitting: Anesthesiology

## 2013-11-04 ENCOUNTER — Inpatient Hospital Stay (HOSPITAL_COMMUNITY): Payer: Medicare Other

## 2013-11-04 DIAGNOSIS — I4891 Unspecified atrial fibrillation: Secondary | ICD-10-CM

## 2013-11-04 DIAGNOSIS — I5023 Acute on chronic systolic (congestive) heart failure: Secondary | ICD-10-CM

## 2013-11-04 DIAGNOSIS — Z95818 Presence of other cardiac implants and grafts: Secondary | ICD-10-CM

## 2013-11-04 HISTORY — PX: TEE WITHOUT CARDIOVERSION: SHX5443

## 2013-11-04 HISTORY — PX: CARDIOVERSION: SHX1299

## 2013-11-04 LAB — BASIC METABOLIC PANEL
BUN: 14 mg/dL (ref 6–23)
CO2: 29 mEq/L (ref 19–32)
Calcium: 7.9 mg/dL — ABNORMAL LOW (ref 8.4–10.5)
Chloride: 97 mEq/L (ref 96–112)
Creatinine, Ser: 1.28 mg/dL (ref 0.50–1.35)
GFR calc Af Amer: 62 mL/min — ABNORMAL LOW (ref >90)
GFR calc non Af Amer: 53 mL/min — ABNORMAL LOW (ref >90)
Glucose, Bld: 99 mg/dL (ref 70–99)
Potassium: 3.5 mEq/L — ABNORMAL LOW (ref 3.7–5.3)
Sodium: 135 mEq/L — ABNORMAL LOW (ref 137–147)

## 2013-11-04 LAB — CBC
MCH: 30.5 pg (ref 26.0–34.0)
MCHC: 32.2 g/dL (ref 30.0–36.0)
Platelets: 294 10*3/uL (ref 150–400)
RBC: 2.79 MIL/uL — ABNORMAL LOW (ref 4.22–5.81)
RDW: 17.8 % — ABNORMAL HIGH (ref 11.5–15.5)

## 2013-11-04 LAB — CARBOXYHEMOGLOBIN
Methemoglobin: 1.4 % (ref 0.0–1.5)
O2 Saturation: 59.6 %
Total hemoglobin: 15.1 g/dL (ref 13.5–18.0)

## 2013-11-04 LAB — PROTIME-INR
INR: 2.91 — ABNORMAL HIGH (ref 0.00–1.49)
Prothrombin Time: 29.4 seconds — ABNORMAL HIGH (ref 11.6–15.2)

## 2013-11-04 LAB — LACTATE DEHYDROGENASE: LDH: 354 U/L — ABNORMAL HIGH (ref 94–250)

## 2013-11-04 LAB — MAGNESIUM: Magnesium: 2.1 mg/dL (ref 1.5–2.5)

## 2013-11-04 SURGERY — ECHOCARDIOGRAM, TRANSESOPHAGEAL
Anesthesia: General

## 2013-11-04 MED ORDER — MIDAZOLAM HCL 5 MG/ML IJ SOLN
INTRAMUSCULAR | Status: AC
  Filled 2013-11-04: qty 2

## 2013-11-04 MED ORDER — SODIUM CHLORIDE 0.9 % IV SOLN
INTRAVENOUS | Status: AC
  Administered 2013-11-04 (×2): via INTRAVENOUS

## 2013-11-04 MED ORDER — POTASSIUM CHLORIDE CRYS ER 20 MEQ PO TBCR
40.0000 meq | EXTENDED_RELEASE_TABLET | Freq: Once | ORAL | Status: AC
  Administered 2013-11-04: 40 meq via ORAL
  Filled 2013-11-04: qty 2

## 2013-11-04 MED ORDER — PROPOFOL 10 MG/ML IV BOLUS
INTRAVENOUS | Status: DC | PRN
  Administered 2013-11-04: 30 mg via INTRAVENOUS

## 2013-11-04 MED ORDER — FENTANYL CITRATE 0.05 MG/ML IJ SOLN
INTRAMUSCULAR | Status: AC
  Filled 2013-11-04: qty 2

## 2013-11-04 MED ORDER — MIDAZOLAM HCL 10 MG/2ML IJ SOLN
INTRAMUSCULAR | Status: DC | PRN
  Administered 2013-11-04: 2 mg via INTRAVENOUS

## 2013-11-04 MED ORDER — SODIUM CHLORIDE 0.9 % IV SOLN
INTRAVENOUS | Status: DC
  Administered 2013-11-04: 08:00:00 via INTRAVENOUS
  Administered 2013-11-07: 10 mL/h via INTRAVENOUS
  Administered 2013-11-12 – 2013-11-13 (×2): 20 mL/h via INTRAVENOUS

## 2013-11-04 MED ORDER — DIPHENHYDRAMINE HCL 50 MG/ML IJ SOLN
INTRAMUSCULAR | Status: AC
  Filled 2013-11-04: qty 1

## 2013-11-04 MED ORDER — WARFARIN SODIUM 1 MG PO TABS
1.0000 mg | ORAL_TABLET | Freq: Once | ORAL | Status: AC
  Administered 2013-11-04: 1 mg via ORAL
  Filled 2013-11-04: qty 1

## 2013-11-04 MED ORDER — BUTAMBEN-TETRACAINE-BENZOCAINE 2-2-14 % EX AERO
INHALATION_SPRAY | CUTANEOUS | Status: DC | PRN
  Administered 2013-11-04: 2 via TOPICAL

## 2013-11-04 MED ORDER — FENTANYL CITRATE 0.05 MG/ML IJ SOLN
INTRAMUSCULAR | Status: DC | PRN
  Administered 2013-11-04: 25 ug via INTRAVENOUS

## 2013-11-04 NOTE — Progress Notes (Signed)
Patient ID: MARKEZ DOWLAND, male   DOB: 1941/06/08, 72 y.o.   MRN: [id number]  HeartMate 2 Rounding Note  Subjective:    Jakevion Arney is a 72 yo man with an ischemic CM s/p ICD implant, chronic systolic HF (EF 20-25%), paroxysmal VT s/p ablation (09/2011) and repeat (06/2013), CAD, PVD (s/p R fem pop BPG 01/2010; s/p L CFA BPG 05/2002), AAA (s/p repair 04/2002), CAS (s/p L CEA 2008), HTN, arthritis, and TIA. Recently admitted 12/8-12/11/14 for syncopal episode from inappropriate ATP which accelerated SVT>>VT with failed ATP and then spontaneous termination of VT. During hospital stay had repeat RHC, hemodynamics were stable and had TEE-DC-CV of AFL 10/15/13 which was successful. He was presented to our [facility] team and felt to be good LVAD candidate and then presented at [facility] transplant listing conference and they felt he was not a candidate for transplant, but good candidate for LVAD under DT criteria.   POD 13 HeartMate II LVAD  On 12/26 had episode of nausea, vomiting and hypotension. Felt pale and weak. PI dropped. Episodes of NSVT. Pump speed decreased to 8600. Started on broad spectrum abx. Also placed on low-dose torsemide due to worsening edema.    Stopped antibiotics yesterday.  Patient still having problems with fall in PI with standing. I stopped his torsemide yesterday but he still got the am dose and had some diuresis.  6 PI events during afternoon and early evening. None today so far.  HR controlled in 90s. I started him on milrinone at low dose yesterday with co-ox down to 51%.  It is up to 59.6% today.  Echo 12/27: LV is small with EF 15%. RV not significantly dilated but severely HK.  Remains AF in 90s-100s (now off metoprolol). INR 2.5-> 3.1 -> 2.7->2.2->2.9 WBC now decreasing 8.2->9.7->11.2-> (abx started) -> 12.5->10.6->10.5->11 LDH 398->354 Co-ox 64%->57%->51%->59.6%  UA (12/26): ok BCx (12/26) : NGTD  LVAD INTERROGATION:  HeartMate II LVAD:  Flow 4.8  liters/min, speed 8400,  power 4.5, PI 5.3.   There were 6 PI events yesterday afternoon and evening.  None so far today.   Objective:    Vital Signs:   Temp:  [97.6 F (36.4 C)-98.6 F (37 C)] 98.5 F (36.9 C) (12/31 0600) Pulse Rate:  [89-102] 90 (12/31 0600) Resp:  [16-17] 16 (12/31 0600) SpO2:  [92 %-94 %] 94 % (12/31 0600) Weight:  [91.7 kg (202 lb 2.6 oz)] 91.7 kg (202 lb 2.6 oz) (12/31 0507) Last BM Date: 11/03/13  Intake/Output:   Intake/Output Summary (Last 24 hours) at 11/04/13 0724 Last data filed at 11/04/13 0700  Gross per 24 hour  Intake 367.83 ml  Output   2000 ml  Net -1632.17 ml     Physical Exam: General: Sitting in chair. Looks better this am HEENT: normal  Neck: supple. JVP 8. Carotids 2+ bilat; no bruits. No lymphadenopathy or thryomegaly appreciated.  Cor: Faint heart sounds , LVAD hum present.  Lungs: Decreased BS at left base.   Abdomen: soft, nontender, nondistended. No hepatosplenomegaly. No bruits or masses. Active bowel sounds.  Driveline: C/D/I; securement device intact and driveline incorporated  Extremities: warm. TED hose in place  1+ edema R>L lower leg Neuro: alert & orientedx3, cranial nerves grossly intact. moves all 4 extremities w/o difficulty. Affect pleasant    Telemetry: A-fib 90s  Labs: Basic Metabolic Panel:  Recent Labs Lab 10/31/13 0555 11/01/13 0914 11/02/13 0500 11/03/13 0455 11/04/13 0500  NA 132* 132* 133* 134* 135*  K 4.5 3.8 3.6  4.1 3.5*  CL 98 97 97 96 97  CO2 27 28 27 28 29   GLUCOSE 98 136* 106* 100* 99  BUN 19 18 15 14 14   CREATININE 1.35 1.38* 1.27 1.21 1.28  CALCIUM 8.1* 8.0* 8.0* 8.2* 7.9*  MG  --   --  1.9  --  2.1    Liver Function Tests:  Recent Labs Lab 10/29/13 0515 10/30/13 0500 11/01/13 0914  AST 90* 85* 31  ALT 64* 76* 40  ALKPHOS 105 118* 107  BILITOT 0.7 0.8 0.7  PROT 5.2* 5.4* 5.0*  ALBUMIN 2.2* 2.3* 2.2*   No results found for this basename: LIPASE, AMYLASE,  in the last 168 hours No results found  for this basename: AMMONIA,  in the last 168 hours  CBC:  Recent Labs Lab 10/31/13 0555 11/01/13 0914 11/02/13 0500 11/03/13 0455 11/04/13 0500  WBC 12.5* 10.6* 10.5 11.3* 9.2  HGB 9.4* 8.8* 9.1* 9.4* 8.5*  HCT 29.5* 27.5* 28.1* 29.0* 26.4*  MCV 95.5 94.8 95.3 95.4 94.6  PLT 308 316 339 346 294    INR:  Recent Labs Lab 10/31/13 0555 11/01/13 0914 11/02/13 0500 11/03/13 0455 11/04/13 0500  INR 3.06* 3.06* 2.68* 2.24* 2.91*   LDH 659 < 726>603>493>458>410->->->->417  Other results:    Imaging: Dg Chest Port 1 View  11/04/2013   CLINICAL DATA:  Ventricular assist device in place.  EXAM: PORTABLE CHEST - 1 VIEW  COMPARISON:  11/02/2013.  FINDINGS: Left ventricular assist device is in place appearing unchanged in position.  AICD/sequential pacemaker enters from the left with tips in the region of the right atrium and right ventricle.  Right central line tip proximal to mid superior vena cava.  Cardiomegaly.  Consolidation left lung base may represent pleural fluid, atelectasis or infiltrate. Limited for evaluating for the possibly of underlying mass.  Pulmonary vascular prominence.  No gross pneumothorax.  Calcified mildly tortuous aorta.  Post median sternotomy.  IMPRESSION: No significant change.  Please see above.   Electronically Signed   By: Bridgett Larsson M.D.   On: 11/04/2013 06:58     Medications:     Scheduled Medications: . amiodarone  200 mg Oral BID  . aspirin EC  325 mg Oral Daily  . bisacodyl  10 mg Oral Daily   Or  . bisacodyl  10 mg Rectal Daily  . digoxin  0.125 mg Oral Daily  . docusate sodium  200 mg Oral Daily  . feeding supplement (ENSURE COMPLETE)  237 mL Oral BID BM  . ferrous gluconate  324 mg Oral BID WC  . guaiFENesin  600 mg Oral BID  . midodrine  5 mg Oral TID WC  . pantoprazole  40 mg Oral Daily  . potassium chloride  40 mEq Oral Once  . ranolazine  500 mg Oral BID  . sodium chloride  3 mL Intravenous Q12H  . warfarin  1 mg Oral  ONCE-1800  . Warfarin - Pharmacist Dosing Inpatient   Does not apply q1800    Infusions: . sodium chloride 20 mL/hr at 10/22/13 0800  . sodium chloride Stopped (10/28/13 0800)  . sodium chloride 250 mL (10/20/13 0600)  . sodium chloride    . sodium chloride    . milrinone 0.126 mcg/kg/min (11/03/13 1900)    PRN Medications: acetaminophen, ondansetron (ZOFRAN) IV, sodium chloride, sodium chloride, sodium chloride, traMADol   Assessment:   1) Chronic systolic HF, Class IV, EF 20-25%  --s/p HM II VAD implant 12/15  2)  NICM/ICM  3) Recurrent Vtach  - ablation 09/2011 and 06/2013  4) Atrial flutter  - s/p DC-CV 10/15/2013  - Atrial fibrillation recurred post-op.  5) CAD with recanalized occluded RCA with left to right collat.  6) PAD: s/p aorto-bifem, bilateral renal artery bypass, bilateral fem-pop, and left CEA.  7) RV failure    Plan/Discussion:    Still dropping PI with standing.  I stopped torsemide and will give him gentle IV fluid for total of 250 cc this morning.  RV dysfunction continues to be a problem.  Co-ox lower yesterday.  He has been on digoxin and I started him back on milrinone at 0.125. Co-ox is better at 59.6% this morning.  I am not going to increase milrinone to 0.25 yet, I will wait to see how the cardioversion goes.   Atrial fibrillation continues to be present.  He is now on bid amiodarone and I started him on ranolazine 500 bid after discussion with Dr. Graciela Husbands to use as adjunct with amiodarone to try to maintain NSR.  Will re-attempt DCCV (will use TEE guidance as he has not had therapeutic INR x 3 wks) today. I hope overall output can improve in NSR.  If co-ox remains low or DCCV fails, will then plan RHC.  No VT.  Patient has been having atrial fibrillation with aberrancy when HR rises.   INR ok today. Given low VAD speed need to try to keep INR therapeutic.   Will continue midodrine for now.   Continue to work with Cardiac Rehab.  I reviewed the  LVAD parameters from today, and compared the results to the patient's prior recorded data.  No programming changes were made.  The LVAD is functioning within specified parameters.  The patient performs LVAD self-test daily.  LVAD interrogation was negative for any significant power changes, alarms or PI events/speed drops.  LVAD equipment check completed and is in good working order.  Back-up equipment present.   LVAD education done on emergency procedures and precautions and reviewed exit site care.  Length of Stay: 17  Marca Ancona MD 11/04/2013, 7:24 AM

## 2013-11-04 NOTE — Progress Notes (Signed)
Patient ID: [name], male   DOB: 12/31/1940, 72 y.o.   MRN: [id number]  HeartMate 2 Rounding Note  Subjective:    [name] is a 72 yo man with an ischemic CM s/p ICD implant, chronic systolic HF (EF 20-25%), paroxysmal VT s/p ablation (09/2011) and repeat (06/2013), CAD, PVD (s/p R fem pop BPG 01/2010; s/p L CFA BPG 05/2002), AAA (s/p repair 04/2002), CAS (s/p L CEA 2008), HTN, arthritis, and TIA. Recently admitted 12/8-12/11/14 for syncopal episode from inappropriate ATP which accelerated SVT>>VT with failed ATP and then spontaneous termination of VT. During hospital stay had repeat RHC, hemodynamics were stable and had TEE-DC-CV of AFL 10/15/13 which was successful. He was presented to our [facility] team and felt to be good LVAD candidate and then presented at [facility] transplant listing conference and they felt he was not a candidate for transplant, but good candidate for LVAD under DT criteria.   POD 16 HeartMate II LVAD   He was started on digoxin yesterday for control of A-fib rate control. He had been having rapid a-fib with aberrancy and not VT.  Underwent TEE directed DCCV today. The RV is fairly small with moderate hypokinesis. There is trivial TR and MR. The aortic valve leaflets are fluttering but sewn shut with no AI. Cannula is pointing at the MV.  He feels better this afternoon and just ate a large lunch. Nausea is gone.  He remains on Milrinone 0.125    LVAD INTERROGATION:  HeartMate II LVAD:  Flow 4.9 liters/min, speed 8400, power 4.2, PI 6.2.    Objective:    Vital Signs:   Temp:  [97.6 F (36.4 C)-98.6 F (37 C)] 97.7 F (36.5 C) (12/31 1358) Pulse Rate:  [45-119] 86 (12/31 1358) Resp:  [10-18] 16 (12/31 1358) BP: (74-82)/(1) 74/1 mmHg (12/31 1100) SpO2:  [92 %-96 %] 95 % (12/31 1358) Weight:  [91.7 kg (202 lb 2.6 oz)] 91.7 kg (202 lb 2.6 oz) (12/31 0507) Last BM Date: 11/03/13 Mean arterial Pressure 96  Intake/Output:   Intake/Output Summary (Last 24 hours)  at 11/04/13 1656 Last data filed at 11/04/13 1527  Gross per 24 hour  Intake 1061.41 ml  Output   1100 ml  Net -38.59 ml     Physical Exam: General: Awake and alert. Conversant. He looks brighter this afternoon. HEENT: normal  Neck: supple. Carotids 2+ bilat; no bruits. No lymphadenopathy or thryomegaly appreciated.  Cor: Faint heart sounds , LVAD hum present.  Lungs: clear but decreased breath sounds at left base.  Abdomen: soft, nontender, nondistended. No hepatosplenomegaly. No bruits or masses. Active bowel sounds.  Driveline: C/D/I; securement device intact and driveline incorporated  Extremities: mild peripheral edema  Neuro: alert & orientedx3, cranial nerves grossly intact. moves all 4 extremities w/o difficulty. Affect pleasant    Telemetry: sinus 70's with some periods of AV pacing  Labs: Basic Metabolic Panel:  Recent Labs Lab 10/31/13 0555 11/01/13 0914 11/02/13 0500 11/03/13 0455 11/04/13 0500  NA 132* 132* 133* 134* 135*  K 4.5 3.8 3.6 4.1 3.5*  CL 98 97 97 96 97  CO2 27 28 27 28 29   GLUCOSE 98 136* 106* 100* 99  BUN 19 18 15 14 14   CREATININE 1.35 1.38* 1.27 1.21 1.28  CALCIUM 8.1* 8.0* 8.0* 8.2* 7.9*  MG  --   --  1.9  --  2.1    Liver Function Tests:  Recent Labs Lab 10/29/13 0515 10/30/13 0500 11/01/13 0914  AST 90* 85* 31  ALT 64* 76* 40  ALKPHOS 105 118* 107  BILITOT 0.7 0.8 0.7  PROT 5.2* 5.4* 5.0*  ALBUMIN 2.2* 2.3* 2.2*   No results found for this basename: LIPASE, AMYLASE,  in the last 168 hours No results found for this basename: AMMONIA,  in the last 168 hours  CBC:  Recent Labs Lab 10/31/13 0555 11/01/13 0914 11/02/13 0500 11/03/13 0455 11/04/13 0500  WBC 12.5* 10.6* 10.5 11.3* 9.2  HGB 9.4* 8.8* 9.1* 9.4* 8.5*  HCT 29.5* 27.5* 28.1* 29.0* 26.4*  MCV 95.5 94.8 95.3 95.4 94.6  PLT 308 316 339 346 294    INR:  Recent Labs Lab 10/31/13 0555 11/01/13 0914 11/02/13 0500 11/03/13 0455 11/04/13 0500  INR 3.06*  3.06* 2.68* 2.24* 2.91*   LDH: 354  Other results:  EKG:   Imaging: Dg Chest Port 1 View  11/04/2013   CLINICAL DATA:  Ventricular assist device in place.  EXAM: PORTABLE CHEST - 1 VIEW  COMPARISON:  11/02/2013.  FINDINGS: Left ventricular assist device is in place appearing unchanged in position.  AICD/sequential pacemaker enters from the left with tips in the region of the right atrium and right ventricle.  Right central line tip proximal to mid superior vena cava.  Cardiomegaly.  Consolidation left lung base may represent pleural fluid, atelectasis or infiltrate. Limited for evaluating for the possibly of underlying mass.  Pulmonary vascular prominence.  No gross pneumothorax.  Calcified mildly tortuous aorta.  Post median sternotomy.  IMPRESSION: No significant change.  Please see above.   Electronically Signed   By: Bridgett Larsson M.D.   On: 11/04/2013 06:58      Medications:     Scheduled Medications: . amiodarone  200 mg Oral BID  . aspirin EC  325 mg Oral Daily  . bisacodyl  10 mg Oral Daily   Or  . bisacodyl  10 mg Rectal Daily  . digoxin  0.125 mg Oral Daily  . docusate sodium  200 mg Oral Daily  . feeding supplement (ENSURE COMPLETE)  237 mL Oral BID BM  . ferrous gluconate  324 mg Oral BID WC  . guaiFENesin  600 mg Oral BID  . midodrine  5 mg Oral TID WC  . pantoprazole  40 mg Oral Daily  . ranolazine  500 mg Oral BID  . sodium chloride  3 mL Intravenous Q12H  . warfarin  1 mg Oral ONCE-1800  . Warfarin - Pharmacist Dosing Inpatient   Does not apply q1800     Infusions: . sodium chloride 20 mL/hr at 10/22/13 0800  . sodium chloride Stopped (10/28/13 0800)  . sodium chloride 250 mL (10/20/13 0600)  . sodium chloride 20 mL/hr at 11/04/13 0815  . sodium chloride    . milrinone 0.125 mcg/kg/min (11/04/13 1528)     PRN Medications:  acetaminophen, ondansetron (ZOFRAN) IV, sodium chloride, sodium chloride, sodium chloride, traMADol   Assessment:   1)  Chronic systolic HF, Class IV, EF 20-25%  --s/p HM II VAD implant 12/15  2) NICM/ICM  3) Recurrent Vtach  - ablation 09/2011 and 06/2013  4) Atrial flutter  - s/p DC-CV 10/15/2013  5) CAD with recanalized occluded RCA with left to right collat.  6) PAD: s/p aorto-bifem, bilateral renal artery bypass, bilateral fem-pop, and left CEA.  7) Severe RV dysfunction by 2D echo 10/31/2013     Plan/Discussion:    He seems improved today on Milrinone and back in sinus rhythm. Hopefully we can maintain this stability and allow  him to diurese and recover physically. His RV is weak but was certainly good enough to allow him to wean off bypass with low dose inotropes and to get off these drugs quickly postop with low CVP.  I would avoid Beta Blockers.   I reviewed the LVAD parameters from today, and compared the results to the patient's prior recorded data.  No programming changes were made.  The LVAD is functioning within specified parameters.  The patient performs LVAD self-test daily.  LVAD interrogation was negative for any significant power changes, alarms or PI events/speed drops.  LVAD equipment check completed and is in good working order.  Back-up equipment present.   LVAD education done on emergency procedures and precautions and reviewed exit site care.  Length of Stay: 17  Abubakar Crispo K 11/04/2013, 4:56 PM

## 2013-11-04 NOTE — Progress Notes (Signed)
  Echocardiogram Echocardiogram Transesophageal has been performed.  [name] 11/04/2013, 10:33 AM

## 2013-11-04 NOTE — CV Procedure (Signed)
Procedure: TEE  Indication: Pre-DCCV  Sedation: Versed 2 mg IV, Fentanyl 25 mcg IV  Findings: Please see echo section of chart for full report.  The left ventricle was mildly dilated.  There is an LVAD inflow cannula visualized, it is in close proximity to the anteroseptal wall and pointing towards the mitral valve.  EF 15% with global hypokinesis and paradoxical septal motion.  The IV septum does show leftwards shift.  The right ventricle was mildly dilated with moderate to severe systolic dysfunction.  Peak RV-RA gradient 25 mmHg with mild TR.  There was trivial MR.  The aortic valve was trileaflet with trivial AI.  The valve opens very slightly with each beat.  There was no LA appendage thrombus.   Proceed to cardioversion.   Marca Ancona 11/04/2013 10:43 AM

## 2013-11-04 NOTE — Progress Notes (Signed)
Dressing done at this time by Hilda Lias, pt's significant other; sterile technique used; will cont. To monitor.

## 2013-11-04 NOTE — Progress Notes (Signed)
OT Cancellation Note  Patient Details Name: [name] MRN: [id number] DOB: 04-Jun-1941   Cancelled Treatment:    Reason Eval/Treat Not Completed: Patient at procedure or test/ unavailable - will reattempt  [name] M [phone] 11/04/2013, 10:29 AM

## 2013-11-04 NOTE — Anesthesia Preprocedure Evaluation (Addendum)
Anesthesia Evaluation  Patient identified by MRN, date of birth, ID band Patient awake    Reviewed: Allergy & Precautions, H&P , NPO status , Patient's Chart, lab work & pertinent test results, reviewed documented beta blocker date and time   Airway Mallampati: II TM Distance: >3 FB Neck ROM: full    Dental  (+) Teeth Intact and Dental Advidsory Given   Pulmonary sleep apnea , former smoker,  breath sounds clear to auscultation        Cardiovascular hypertension, On Medications and On Home Beta Blockers + angina + CAD, + Past MI and + Peripheral Vascular Disease + dysrhythmias Atrial Fibrillation + Cardiac Defibrillator Rhythm:regular     Neuro/Psych TIACVA negative psych ROS   GI/Hepatic negative GI ROS, Neg liver ROS,   Endo/Other  Hypothyroidism   Renal/GU Renal InsufficiencyRenal disease  negative genitourinary   Musculoskeletal   Abdominal   Peds  Hematology negative hematology ROS (+) anemia ,   Anesthesia Other Findings See surgeon's H&P   Reproductive/Obstetrics negative OB ROS                          Anesthesia Physical Anesthesia Plan  ASA: IV  Anesthesia Plan: General   Post-op Pain Management:    Induction: Intravenous  Airway Management Planned: Mask  Additional Equipment:   Intra-op Plan:   Post-operative Plan:   Informed Consent: I have reviewed the patients History and Physical, chart, labs and discussed the procedure including the risks, benefits and alternatives for the proposed anesthesia with the patient or authorized representative who has indicated his/her understanding and acceptance.   Dental Advisory Given  Plan Discussed with: CRNA, Surgeon and Anesthesiologist  Anesthesia Plan Comments:        Anesthesia Quick Evaluation

## 2013-11-04 NOTE — Progress Notes (Signed)
CSW met with patient at bedside. Patient reports he just returned from cardioversion. He was sitting up in chair and reports he is feeling much better. Patient continues to have positive outlook and stated "hope this is the last one (cardioversion)". He states that his SO Hilda Lias will be visiting tonight and she continues to provide support and encouragement. CSW provided support and will continue to follow through recovery. Lasandra Beech, LCSW [phone]

## 2013-11-04 NOTE — H&P (View-Only) (Signed)
Patient ID: [name], male   DOB: 08/22/1941, 72 y.o.   MRN: [id number]  HeartMate 2 Rounding Note  Subjective:    [name] is a 71 yo man with an ischemic CM s/p ICD implant, chronic systolic HF (EF 20-25%), paroxysmal VT s/p ablation (09/2011) and repeat (06/2013), CAD, PVD (s/p R fem pop BPG 01/2010; s/p L CFA BPG 05/2002), AAA (s/p repair 04/2002), CAS (s/p L CEA 2008), HTN, arthritis, and TIA. Recently admitted 12/8-12/11/14 for syncopal episode from inappropriate ATP which accelerated SVT>>VT with failed ATP and then spontaneous termination of VT. During hospital stay had repeat RHC, hemodynamics were stable and had TEE-DC-CV of AFL 10/15/13 which was successful. He was presented to our [facility] team and felt to be good LVAD candidate and then presented at [facility] transplant listing conference and they felt he was not a candidate for transplant, but good candidate for LVAD under DT criteria.   POD 13 HeartMate II LVAD  On 12/26 had episode of nausea, vomiting and hypotension. Felt pale and weak. PI dropped. Episodes of NSVT. Pump speed decreased to 8600. Started on broad spectrum abx. Also placed on low-dose torsemide due to worsening edema.    Stopped antibiotics yesterday.  Patient still having problems with fall in PI with standing. I stopped his torsemide yesterday but he still got the am dose and had some diuresis.  6 PI events during afternoon and early evening. None today so far.  HR controlled in 90s. I started him on milrinone at low dose yesterday with co-ox down to 51%.  It is up to 59.6% today.  Echo 12/27: LV is small with EF 15%. RV not significantly dilated but severely HK.  Remains AF in 90s-100s (now off metoprolol). INR 2.5-> 3.1 -> 2.7->2.2->2.9 WBC now decreasing 8.2->9.7->11.2-> (abx started) -> 12.5->10.6->10.5->11 LDH 398->354 Co-ox 64%->57%->51%->59.6%  UA (12/26): ok BCx (12/26) : NGTD  LVAD INTERROGATION:  HeartMate II LVAD:  Flow 4.8  liters/min, speed 8400,  power 4.5, PI 5.3.   There were 6 PI events yesterday afternoon and evening.  None so far today.   Objective:    Vital Signs:   Temp:  [97.6 F (36.4 C)-98.6 F (37 C)] 98.5 F (36.9 C) (12/31 0600) Pulse Rate:  [89-102] 90 (12/31 0600) Resp:  [16-17] 16 (12/31 0600) SpO2:  [92 %-94 %] 94 % (12/31 0600) Weight:  [91.7 kg (202 lb 2.6 oz)] 91.7 kg (202 lb 2.6 oz) (12/31 0507) Last BM Date: 11/03/13  Intake/Output:   Intake/Output Summary (Last 24 hours) at 11/04/13 0724 Last data filed at 11/04/13 0700  Gross per 24 hour  Intake 367.83 ml  Output   2000 ml  Net -1632.17 ml     Physical Exam: General: Sitting in chair. Looks better this am HEENT: normal  Neck: supple. JVP 8. Carotids 2+ bilat; no bruits. No lymphadenopathy or thryomegaly appreciated.  Cor: Faint heart sounds , LVAD hum present.  Lungs: Decreased BS at left base.   Abdomen: soft, nontender, nondistended. No hepatosplenomegaly. No bruits or masses. Active bowel sounds.  Driveline: C/D/I; securement device intact and driveline incorporated  Extremities: warm. TED hose in place  1+ edema R>L lower leg Neuro: alert & orientedx3, cranial nerves grossly intact. moves all 4 extremities w/o difficulty. Affect pleasant    Telemetry: A-fib 90s  Labs: Basic Metabolic Panel:  Recent Labs Lab 10/31/13 0555 11/01/13 0914 11/02/13 0500 11/03/13 0455 11/04/13 0500  NA 132* 132* 133* 134* 135*  K 4.5 3.8 3.6   4.1 3.5*  CL 98 97 97 96 97  CO2 27 28 27 28 29  GLUCOSE 98 136* 106* 100* 99  BUN 19 18 15 14 14  CREATININE 1.35 1.38* 1.27 1.21 1.28  CALCIUM 8.1* 8.0* 8.0* 8.2* 7.9*  MG  --   --  1.9  --  2.1    Liver Function Tests:  Recent Labs Lab 10/29/13 0515 10/30/13 0500 11/01/13 0914  AST 90* 85* 31  ALT 64* 76* 40  ALKPHOS 105 118* 107  BILITOT 0.7 0.8 0.7  PROT 5.2* 5.4* 5.0*  ALBUMIN 2.2* 2.3* 2.2*   No results found for this basename: LIPASE, AMYLASE,  in the last 168 hours No results found  for this basename: AMMONIA,  in the last 168 hours  CBC:  Recent Labs Lab 10/31/13 0555 11/01/13 0914 11/02/13 0500 11/03/13 0455 11/04/13 0500  WBC 12.5* 10.6* 10.5 11.3* 9.2  HGB 9.4* 8.8* 9.1* 9.4* 8.5*  HCT 29.5* 27.5* 28.1* 29.0* 26.4*  MCV 95.5 94.8 95.3 95.4 94.6  PLT 308 316 339 346 294    INR:  Recent Labs Lab 10/31/13 0555 11/01/13 0914 11/02/13 0500 11/03/13 0455 11/04/13 0500  INR 3.06* 3.06* 2.68* 2.24* 2.91*   LDH 659 < 726>603>493>458>410->->->->417  Other results:    Imaging: Dg Chest Port 1 View  11/04/2013   CLINICAL DATA:  Ventricular assist device in place.  EXAM: PORTABLE CHEST - 1 VIEW  COMPARISON:  11/02/2013.  FINDINGS: Left ventricular assist device is in place appearing unchanged in position.  AICD/sequential pacemaker enters from the left with tips in the region of the right atrium and right ventricle.  Right central line tip proximal to mid superior vena cava.  Cardiomegaly.  Consolidation left lung base may represent pleural fluid, atelectasis or infiltrate. Limited for evaluating for the possibly of underlying mass.  Pulmonary vascular prominence.  No gross pneumothorax.  Calcified mildly tortuous aorta.  Post median sternotomy.  IMPRESSION: No significant change.  Please see above.   Electronically Signed   By: Steve  Olson M.D.   On: 11/04/2013 06:58     Medications:     Scheduled Medications: . amiodarone  200 mg Oral BID  . aspirin EC  325 mg Oral Daily  . bisacodyl  10 mg Oral Daily   Or  . bisacodyl  10 mg Rectal Daily  . digoxin  0.125 mg Oral Daily  . docusate sodium  200 mg Oral Daily  . feeding supplement (ENSURE COMPLETE)  237 mL Oral BID BM  . ferrous gluconate  324 mg Oral BID WC  . guaiFENesin  600 mg Oral BID  . midodrine  5 mg Oral TID WC  . pantoprazole  40 mg Oral Daily  . potassium chloride  40 mEq Oral Once  . ranolazine  500 mg Oral BID  . sodium chloride  3 mL Intravenous Q12H  . warfarin  1 mg Oral  ONCE-1800  . Warfarin - Pharmacist Dosing Inpatient   Does not apply q1800    Infusions: . sodium chloride 20 mL/hr at 10/22/13 0800  . sodium chloride Stopped (10/28/13 0800)  . sodium chloride 250 mL (10/20/13 0600)  . sodium chloride    . sodium chloride    . milrinone 0.126 mcg/kg/min (11/03/13 1900)    PRN Medications: acetaminophen, ondansetron (ZOFRAN) IV, sodium chloride, sodium chloride, sodium chloride, traMADol   Assessment:   1) Chronic systolic HF, Class IV, EF 20-25%  --s/p HM II VAD implant 12/15  2)   NICM/ICM  3) Recurrent Vtach  - ablation 09/2011 and 06/2013  4) Atrial flutter  - s/p DC-CV 10/15/2013  - Atrial fibrillation recurred post-op.  5) CAD with recanalized occluded RCA with left to right collat.  6) PAD: s/p aorto-bifem, bilateral renal artery bypass, bilateral fem-pop, and left CEA.  7) RV failure    Plan/Discussion:    Still dropping PI with standing.  I stopped torsemide and will give him gentle IV fluid for total of 250 cc this morning.  RV dysfunction continues to be a problem.  Co-ox lower yesterday.  He has been on digoxin and I started him back on milrinone at 0.125. Co-ox is better at 59.6% this morning.  I am not going to increase milrinone to 0.25 yet, I will wait to see how the cardioversion goes.   Atrial fibrillation continues to be present.  He is now on bid amiodarone and I started him on ranolazine 500 bid after discussion with Dr. Klein to use as adjunct with amiodarone to try to maintain NSR.  Will re-attempt DCCV (will use TEE guidance as he has not had therapeutic INR x 3 wks) today. I hope overall output can improve in NSR.  If co-ox remains low or DCCV fails, will then plan RHC.  No VT.  Patient has been having atrial fibrillation with aberrancy when HR rises.   INR ok today. Given low VAD speed need to try to keep INR therapeutic.   Will continue midodrine for now.   Continue to work with Cardiac Rehab.  I reviewed the  LVAD parameters from today, and compared the results to the patient's prior recorded data.  No programming changes were made.  The LVAD is functioning within specified parameters.  The patient performs LVAD self-test daily.  LVAD interrogation was negative for any significant power changes, alarms or PI events/speed drops.  LVAD equipment check completed and is in good working order.  Back-up equipment present.   LVAD education done on emergency procedures and precautions and reviewed exit site care.  Length of Stay: 17  Dalton McLean MD 11/04/2013, 7:24 AM  

## 2013-11-04 NOTE — Progress Notes (Signed)
ANTICOAGULATION CONSULT NOTE - Follow Up Consult  Pharmacy Consult for Coumadin Indication: atrial fibrillation / s/p LVAD 12/15  Allergies  Allergen Reactions  . Demerol [Meperidine]     Paralysis. Could only move eyes.  . Meperidine Hcl Other (See Comments)    "CAN'T MOVE" CATATONIC PER PT.  [name] Opium Other (See Comments)    "CAN'T MOVE" CATATONIC PER PT.    Patient Measurements: Height: 6' (182.9 cm) Weight: 202 lb 2.6 oz (91.7 kg) IBW/kg (Calculated) : 77.6  Vital Signs: Temp: 98.5 F (36.9 C) (12/31 0600) Temp src: Oral (12/31 0600) Pulse Rate: 90 (12/31 0600)  Labs:  Recent Labs  11/02/13 0500 11/03/13 0455 11/04/13 0500  HGB 9.1* 9.4* 8.5*  HCT 28.1* 29.0* 26.4*  PLT 339 346 294  LABPROT 27.6* 24.1* 29.4*  INR 2.68* 2.24* 2.91*  CREATININE 1.27 1.21 1.28    Estimated Creatinine Clearance: 55.6 ml/min (by C-G formula based on Cr of 1.28).   Medical History: Past Medical History  Diagnosis Date  . CAD (coronary artery disease)     s/p MI 1982;  s/p DES to CFX 2011;  s/p NSTEMI 4/12 in setting of VTach;  cath 7/12:  LM ok, LAD mid 30-40%, Dx's with 40%, mCFX stent patent, prox to mid RCA occluded with R to R and L to R collats.  Medical Tx was continued  . Bradycardia   . Ventricular tachycardia     s/p AICD;   h/o ICD shock;  Amiodarone Rx. S/P ablation in 2012  . 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 HTN (hypertension)   . HLD (hyperlipidemia)   . Cytopenia   . Hypothyroidism   . Renal insufficiency   . Claudication   . Chronic systolic heart failure   . Ischemic cardiomyopathy     echo 7/12:  EF 25-30%, mild AI, mod MR, mod LAE  . TIA (transient ischemic attack) 2001  . CVD (cerebrovascular disease)     left CEA 2008  . Stroke   . Anemia   . Inappropriate therapy from implantable cardioverter-defibrillator  04/02/2013    ATP for sinus tach  SVT wavelet identified SVT but was no  passive   . Myocardial infarction 1992  . Anginal pain   . Automatic implantable cardioverter-defibrillator in situ     [employer]onic Evera device serial number [id number]    . Sleep apnea     denies wearing mask (05/25/2013)  . Arthritis     "a little in my knees" (05/25/2013)  . Melanoma of ear 2013    "near left ear" (05/25/2013)      Assessment: 72yom with HF s/p LVAD placement 12/15.  Afib CVR 90s at rest but with runs of RVR up to 140s with standing and  walking.   Current tx amiodarone, digoxin and ranolazine. Plan for DCCV 12/31.  INR 2.9 today, cbc low stable watch, no bleeding noted, on iron supplement bid.  Goal of Therapy:  INR 2-3 Monitor platelets by anticoagulation protocol: Yes   Plan:  Coumadin 1 mg x1 today Daily INR, CBC  Leota Sauers Pharm.D. CPP, BCPS Clinical Pharmacist [phone] 11/04/2013 7:27 AM

## 2013-11-04 NOTE — Anesthesia Postprocedure Evaluation (Signed)
  Anesthesia Post-op Note  Patient: [name]  Procedure(s) Performed: Procedure(s): TRANSESOPHAGEAL ECHOCARDIOGRAM (TEE) (N/A) CARDIOVERSION (N/A)  Patient Location: Endoscopy Unit  Anesthesia Type:MAC  Level of Consciousness: awake, alert  and oriented  Airway and Oxygen Therapy: Patient Spontanous Breathing and Patient connected to nasal cannula oxygen  Post-op Pain: none  Post-op Assessment: Post-op Vital signs reviewed, Patient's Cardiovascular Status Stable, Respiratory Function Stable, Patent Airway, No signs of Nausea or vomiting, Adequate PO intake and Pain level controlled  Post-op Vital Signs: Reviewed and stable  Complications: No apparent anesthesia complications

## 2013-11-04 NOTE — Procedures (Signed)
Electrical Cardioversion Procedure Note MALAKHAI BEITLER [id number] [birth year]  Procedure: Electrical Cardioversion Indications:  Atrial Fibrillation.  INR therapeutic today and no thrombus on TEE.   Procedure Details Consent: Risks of procedure as well as the alternatives and risks of each were explained to the (patient/caregiver).  Consent for procedure obtained. Time Out: Verified patient identification, verified procedure, site/side was marked, verified correct patient position, special equipment/implants available, medications/allergies/relevent history reviewed, required imaging and test results available.  Performed  Patient placed on cardiac monitor, pulse oximetry, supplemental oxygen as necessary.  Sedation given: Propofol 30 mg IV per anesthesia Pacer pads placed anterior and posterior chest.  Cardioverted 1 time(s).  Cardioverted at 200J.  Evaluation Findings: Post procedure EKG shows: NSR Complications: None Patient did tolerate procedure well.  ICD interrogated post-procedure and functioning appropriately.    Marca Ancona 11/04/2013, 10:39 AM

## 2013-11-04 NOTE — Interval H&P Note (Signed)
History and Physical Interval Note:  11/04/2013 10:10 AM  [name]  has presented today for surgery, with the diagnosis of atrial fibrillation  The various methods of treatment have been discussed with the patient and family. After consideration of risks, benefits and other options for treatment, the patient has consented to  Procedure(s): TRANSESOPHAGEAL ECHOCARDIOGRAM (TEE) (N/A) CARDIOVERSION (N/A) as a surgical intervention .  The patient's history has been reviewed, patient examined, no change in status, stable for surgery.  I have reviewed the patient's chart and labs.  Questions were answered to the patient's satisfaction.     [name]

## 2013-11-04 NOTE — Anesthesia Procedure Notes (Signed)
Procedure Name: MAC Date/Time: 11/04/2013 10:30 AM Performed by: Carmela Rima Pre-anesthesia Checklist: Suction available, Emergency Drugs available, Patient identified, Patient being monitored and Timeout performed Patient Re-evaluated:Patient Re-evaluated prior to inductionOxygen Delivery Method: Ambu bag and Nasal cannula Preoxygenation: Pre-oxygenation with 100% oxygen Intubation Type: IV induction Dental Injury: Teeth and Oropharynx as per pre-operative assessment

## 2013-11-04 NOTE — Progress Notes (Signed)
Pt back in room s/p TEE and cardioversion; HR now SR; pt resting quietly at this time; pt responds to verbal stimuli; VSS; will cont. To monitor.

## 2013-11-04 NOTE — Transfer of Care (Signed)
Immediate Anesthesia Transfer of Care Note  Patient: [name]  Procedure(s) Performed: Procedure(s): TRANSESOPHAGEAL ECHOCARDIOGRAM (TEE) (N/A) CARDIOVERSION (N/A)  Patient Location: Endoscopy Unit  Anesthesia Type:MAC  Level of Consciousness: awake, alert  and oriented  Airway & Oxygen Therapy: Patient Spontanous Breathing and Patient connected to nasal cannula oxygen  Post-op Assessment: Report given to PACU RN, Post -op Vital signs reviewed and stable and Patient moving all extremities X 4  Post vital signs: Reviewed and stable  Complications: No apparent anesthesia complications

## 2013-11-04 NOTE — Progress Notes (Signed)
CARDIAC REHAB PHASE I   PRE:  Rate/Rhythm: 76  SR    BP: sitting 72    SaO2: 94 2L  MODE:  Ambulation: 260 ft   POST:  Rate/Rhythm: 88 SR    BP: sitting 96      SaO2: 90-93 2L  Pt rested now, ready to walk. Likes new vest better but it is hard to reach battery holders while sitting. Legs stronger, stood x1 with gait belt assist. Fairly quick pace walking. Sts less SOB today. Rest x1 (short). Upon returning to room pt exhausted and had to plop in recliner. Sts he was SOB mostly. SaO2 90-92 on 2L (this was first time it has registered after walking). MAP elevated (this is new as well). PI stable standing and walking around 5.5-6.5. Pt pleased today. [id number]  [name] [place] CES, ACSM 11/04/2013 3:40 PM     76 SR

## 2013-11-04 NOTE — Progress Notes (Signed)
PT Cancellation Note  Patient Details Name: JERZY ROEPKE MRN: [id number] DOB: 01/08/1941   Cancelled Treatment:    Reason Eval/Treat Not Completed: Medical issues which prohibited therapy.  Pt was cardioverted and then had lunch.  Cardiac rehab to see at 3 pm.  Will return on Friday to continue PT.  Thanks.   [name],Latora Quarry 11/04/2013, 2:25 PM [facility] Acute Rehabilitation [phone] (pager)

## 2013-11-04 NOTE — Progress Notes (Signed)
Pt with nausea at this time; no vomiting at this time; pt given IV Zofran; will cont. To monitor.

## 2013-11-05 LAB — CARBOXYHEMOGLOBIN
Carboxyhemoglobin: 2.2 % — ABNORMAL HIGH (ref 0.5–1.5)
Methemoglobin: 0.9 % (ref 0.0–1.5)
O2 Saturation: 61.4 %
Total hemoglobin: 8.4 g/dL — ABNORMAL LOW (ref 13.5–18.0)

## 2013-11-05 LAB — CBC
HCT: 26.2 % — ABNORMAL LOW (ref 39.0–52.0)
Hemoglobin: 8.4 g/dL — ABNORMAL LOW (ref 13.0–17.0)
MCH: 30.3 pg (ref 26.0–34.0)
MCHC: 32.1 g/dL (ref 30.0–36.0)
MCV: 94.6 fL (ref 78.0–100.0)
Platelets: 316 10*3/uL (ref 150–400)
RBC: 2.77 MIL/uL — ABNORMAL LOW (ref 4.22–5.81)
RDW: 17.5 % — ABNORMAL HIGH (ref 11.5–15.5)
WBC: 7.9 10*3/uL (ref 4.0–10.5)

## 2013-11-05 LAB — BASIC METABOLIC PANEL
BUN: 15 mg/dL (ref 6–23)
CO2: 27 mEq/L (ref 19–32)
Calcium: 8 mg/dL — ABNORMAL LOW (ref 8.4–10.5)
Chloride: 98 mEq/L (ref 96–112)
Creatinine, Ser: 1.28 mg/dL (ref 0.50–1.35)
GFR calc Af Amer: 62 mL/min — ABNORMAL LOW (ref >90)
GFR calc non Af Amer: 53 mL/min — ABNORMAL LOW (ref >90)
Glucose, Bld: 102 mg/dL — ABNORMAL HIGH (ref 70–99)
Potassium: 3.7 mEq/L (ref 3.7–5.3)
Sodium: 136 mEq/L — ABNORMAL LOW (ref 137–147)

## 2013-11-05 LAB — PROTIME-INR
INR: 2.76 — ABNORMAL HIGH (ref 0.00–1.49)
Prothrombin Time: 28.2 seconds — ABNORMAL HIGH (ref 11.6–15.2)

## 2013-11-05 LAB — LACTATE DEHYDROGENASE: LDH: 382 U/L — ABNORMAL HIGH (ref 94–250)

## 2013-11-05 MED ORDER — WARFARIN SODIUM 2 MG PO TABS
2.0000 mg | ORAL_TABLET | Freq: Once | ORAL | Status: AC
  Administered 2013-11-05: 2 mg via ORAL
  Filled 2013-11-05: qty 1

## 2013-11-05 NOTE — Progress Notes (Signed)
Patient ID: [name], male   DOB: 02/07/1941, 73 y.o.   MRN: [id number]  HeartMate 2 Rounding Note  Subjective:    [name] is a 73 yo man with an ischemic CM s/p ICD implant, chronic systolic HF (EF 40-98%), paroxysmal VT s/p ablation (09/2011) and repeat (06/2013), CAD, PVD (s/p R fem pop BPG 01/2010; s/p L CFA BPG 05/2002), AAA (s/p repair 04/2002), CAS (s/p L CEA 2008), HTN, arthritis, and TIA. Recently admitted 12/8-12/11/14 for syncopal episode from inappropriate ATP which accelerated SVT>>VT with failed ATP and then spontaneous termination of VT. During hospital stay had repeat RHC, hemodynamics were stable and had TEE-DC-CV of AFL 10/15/13 which was successful. [name] was presented to our [facility] team and felt to be good LVAD candidate and then presented at [facility] transplant listing conference and they felt [name] was not a candidate for transplant, but good candidate for LVAD under DT criteria.   Echo 12/27: LV is small with EF 15%. RV not significantly dilated but severely HK.  12/30: Milrinone begun with decreased Coox and torsemide stopped with PI drops.  12/31: TEE-guided DCCV back to NSR.   Today, patient seems to be doing much better.  PI is higher.  When [name] stood this morning, there was not a significant fall in PI. [name] remains in NSR.  Co-ox is back up to 61%.  INR 2.5-> 3.1 -> 2.7->2.2->2.9->2.76 LDH 398->354->382 Co-ox 64%->57%->51%->61%  UA (12/26): ok BCx (12/26) : NGTD  LVAD INTERROGATION:  HeartMate II LVAD:  Flow 5.3  liters/min, speed 8400, power 4.8, PI 6.   There were 3 PI events yesterday afternoon and early morning today.  Less than the day prior.   Objective:    Vital Signs:   Temp:  [97.7 F (36.5 C)-98.6 F (37 C)] 98.3 F (36.8 C) (01/01 0537) Pulse Rate:  [45-119] 70 (01/01 0537) Resp:  [10-18] 16 (12/31 1358) BP: (74-82)/(1) 74/1 mmHg (12/31 1100) SpO2:  [92 %-99 %] 99 % (01/01 0537) Weight:  [92.443 kg (203 lb 12.8 oz)] 92.443 kg (203 lb 12.8 oz)  (01/01 0537) Last BM Date: 11/04/13  Intake/Output:   Intake/Output Summary (Last 24 hours) at 11/05/13 0829 Last data filed at 11/04/13 1717  Gross per 24 hour  Intake 1176.67 ml  Output      0 ml  Net 1176.67 ml     Physical Exam: General: Sitting in chair. Looks better this am HEENT: normal  Neck: supple. JVP 8-9. Carotids 2+ bilat; no bruits. No lymphadenopathy or thryomegaly appreciated.  Cor: Faint heart sounds , LVAD hum present.  Lungs: Decreased BS at left base.   Abdomen: soft, nontender, nondistended. No hepatosplenomegaly. No bruits or masses. Active bowel sounds.  Driveline: C/D/I; securement device intact and driveline incorporated  Extremities: warm. TED hose in place  1+ edema R>L lower leg Neuro: alert & orientedx3, cranial nerves grossly intact. moves all 4 extremities w/o difficulty. Affect pleasant    Telemetry: A-fib 90s  Labs: Basic Metabolic Panel:  Recent Labs Lab 11/01/13 0914 11/02/13 0500 11/03/13 0455 11/04/13 0500 11/05/13 0455  NA 132* 133* 134* 135* 136*  K 3.8 3.6 4.1 3.5* 3.7  CL 97 97 96 97 98  CO2 28 27 28 29 27   GLUCOSE 136* 106* 100* 99 102*  BUN 18 15 14 14 15   CREATININE 1.38* 1.27 1.21 1.28 1.28  CALCIUM 8.0* 8.0* 8.2* 7.9* 8.0*  MG  --  1.9  --  2.1  --  Liver Function Tests:  Recent Labs Lab 10/30/13 0500 11/01/13 0914  AST 85* 31  ALT 76* 40  ALKPHOS 118* 107  BILITOT 0.8 0.7  PROT 5.4* 5.0*  ALBUMIN 2.3* 2.2*   No results found for this basename: LIPASE, AMYLASE,  in the last 168 hours No results found for this basename: AMMONIA,  in the last 168 hours  CBC:  Recent Labs Lab 11/01/13 0914 11/02/13 0500 11/03/13 0455 11/04/13 0500 11/05/13 0455  WBC 10.6* 10.5 11.3* 9.2 7.9  HGB 8.8* 9.1* 9.4* 8.5* 8.4*  HCT 27.5* 28.1* 29.0* 26.4* 26.2*  MCV 94.8 95.3 95.4 94.6 94.6  PLT 316 339 346 294 316    INR:  Recent Labs Lab 11/01/13 0914 11/02/13 0500 11/03/13 0455 11/04/13 0500 11/05/13 0455   INR 3.06* 2.68* 2.24* 2.91* 2.76*   LDH 659 < 726>603>493>458>410->->->->417  Other results:    Imaging: Dg Chest Port 1 View  11/04/2013   CLINICAL DATA:  Ventricular assist device in place.  EXAM: PORTABLE CHEST - 1 VIEW  COMPARISON:  11/02/2013.  FINDINGS: Left ventricular assist device is in place appearing unchanged in position.  AICD/sequential pacemaker enters from the left with tips in the region of the right atrium and right ventricle.  Right central line tip proximal to mid superior vena cava.  Cardiomegaly.  Consolidation left lung base may represent pleural fluid, atelectasis or infiltrate. Limited for evaluating for the possibly of underlying mass.  Pulmonary vascular prominence.  No gross pneumothorax.  Calcified mildly tortuous aorta.  Post median sternotomy.  IMPRESSION: No significant change.  Please see above.   Electronically Signed   By: Chauncey Cruel M.D.   On: 11/04/2013 06:58     Medications:     Scheduled Medications: . amiodarone  200 mg Oral BID  . aspirin EC  325 mg Oral Daily  . bisacodyl  10 mg Oral Daily   Or  . bisacodyl  10 mg Rectal Daily  . digoxin  0.125 mg Oral Daily  . docusate sodium  200 mg Oral Daily  . feeding supplement (ENSURE COMPLETE)  237 mL Oral BID BM  . ferrous gluconate  324 mg Oral BID WC  . guaiFENesin  600 mg Oral BID  . midodrine  5 mg Oral TID WC  . pantoprazole  40 mg Oral Daily  . ranolazine  500 mg Oral BID  . sodium chloride  3 mL Intravenous Q12H  . Warfarin - Pharmacist Dosing Inpatient   Does not apply q1800    Infusions: . sodium chloride 20 mL/hr at 10/22/13 0800  . sodium chloride Stopped (10/28/13 0800)  . sodium chloride 250 mL (10/20/13 0600)  . sodium chloride 20 mL/hr at 11/04/13 0815  . sodium chloride    . milrinone 0.125 mcg/kg/min (11/04/13 1528)    PRN Medications: acetaminophen, ondansetron (ZOFRAN) IV, sodium chloride, sodium chloride, sodium chloride, traMADol   Assessment:   1) Chronic  systolic HF, Class IV, EF 20-25%  --s/p HM II VAD implant 12/15  2) NICM/ICM  3) Recurrent Vtach  - ablation 09/2011 and 06/2013  4) Atrial flutter  - s/p DC-CV 10/15/2013  - Atrial fibrillation recurred post-op.  - TEE-guided DCCV 12/31 5) CAD with recanalized occluded RCA with left to right collat.  6) PAD: s/p aorto-bifem, bilateral renal artery bypass, bilateral fem-pop, and left CEA.  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     Plan/Discussion:    Patient seems improved this morning in NSR.  PI is higher  and did not fall this morning when [name] stood up.  [name] is now off torsemide.  Co-ox up to 61% today.  [name] is tolerating milrinone @ 0.125 for RV support with MAP in the 70s currently. Will watch today on current regimen.  May attempt to re-initiate torsemide tomorrow now that RV function appears to be better in NSR and on milrinone.   [name] is now on bid amiodarone and I started him on ranolazine 500 bid after discussion with Dr. Caryl Comes to use as adjunct with amiodarone to try to maintain NSR.  Will get ECG to follow QT today.   No VT.  Patient had been having atrial fibrillation with aberrancy when HR rises.   INR ok today. Given low VAD speed need to try to keep INR therapeutic.   Will continue midodrine for now.   Continue to work with Cardiac Rehab.  I reviewed the LVAD parameters from today, and compared the results to the patient's prior recorded data.  No programming changes were made.  The LVAD is functioning within specified parameters.  The patient performs LVAD self-test daily.  LVAD interrogation was negative for any significant power changes, alarms or PI events/speed drops.  LVAD equipment check completed and is in good working order.  Back-up equipment present.   LVAD education done on emergency procedures and precautions and reviewed exit site care.  Length of Stay: 18  Loralie Champagne MD 11/05/2013, 8:29 AM

## 2013-11-05 NOTE — Progress Notes (Signed)
LVAD Driveline dsg changed using sterile technique. Site was pink around driveline and a scant amount of bloody drainage was noted on silver. Sututre intact. Site cleansed. Silver and dressing reapplied. Will continue to monitor pt closely.

## 2013-11-05 NOTE — Progress Notes (Signed)
Occupational Therapy Treatment Patient Details Name: [name] MRN: [id number] DOB: 1941/03/08 Today's Date: 11/05/2013 Time: 0272-5366 OT Time Calculation (min): 35 min  OT Assessment / Plan / Recommendation  History of present illness Pt admit for LVAD placement.     OT comments  Pt did well this morning.  He was able to switch from power supply <> battery with only one cue to remember to check battery charge.  He requires mod A to move from sit to stand from recliner.  PI starting was 6.8, lowest PI 5.4 while amulating  (~120'), and PI 7.8 after ~4 mins rest seated.  HR resting 79, and increased to 138 with ambulation.  Pt SOB with dyspnea 34 with activity  Follow Up Recommendations  Home health OT;Supervision/Assistance - 24 hour    Barriers to Discharge       Equipment Recommendations  3 in 1 bedside comode    Recommendations for Other Services    Frequency Min 3X/week   Progress towards OT Goals Progress towards OT goals: Progressing toward goals  Plan Discharge plan remains appropriate    Precautions / Restrictions Precautions Precautions: Sternal;Fall Precaution Comments: LVAD; closely monitor PI as dropping when changing positions Required Braces or Orthoses: Other Brace/Splint Other Brace/Splint: strap for controller; vest for batteries; needs to take bag with back up supplies with him whenever he leaves his room Restrictions Weight Bearing Restrictions: Yes Other Position/Activity Restrictions: sternal precautions   Pertinent Vitals/Pain     ADL  Grooming: Wash/dry hands;Wash/dry face;Teeth care;Brushing hair;Set up Where Assessed - Grooming: Supported sitting Lower Body Dressing: Moderate assistance Where Assessed - Lower Body Dressing: Supported sit to Lobbyist: Moderate assistance Toilet Transfer Method: Sit to stand;Stand pivot Toilet Transfer Equipment: Bedside commode;Comfort height toilet Toileting - Clothing Manipulation and Hygiene:  Minimal assistance Where Assessed - Toileting Clothing Manipulation and Hygiene: Standing Transfers/Ambulation Related to ADLs: ambulates with min guard assist.  Mod A sit to stand from recliner ADL Comments: Pt able to access feet without breaking sternal precautions.       OT Diagnosis:    OT Problem List:   OT Treatment Interventions:     OT Goals(current goals can now be found in the care plan section) Acute Rehab OT Goals Patient Stated Goal: to get better OT Goal Formulation: With patient Time For Goal Achievement: 11/19/13 Potential to Achieve Goals: Good ADL Goals Pt Will Perform Grooming: with supervision;standing Pt Will Perform Upper Body Bathing: with set-up;sitting Pt Will Perform Lower Body Bathing: with supervision;sit to/from stand Pt Will Perform Upper Body Dressing: with set-up;sitting Pt Will Perform Lower Body Dressing: with supervision;sit to/from stand Pt Will Transfer to Toilet: with supervision;ambulating;bedside commode;regular height toilet Pt Will Perform Toileting - Clothing Manipulation and hygiene: with supervision;sit to/from stand Additional ADL Goal #1: Pt will be independent with managing LVAD equipment during all BADLs  Additional ADL Goal #2: Pt will be independent with sternal precautions during all BADLs   Visit Information  Last OT Received On: 11/05/13 Assistance Needed: +1 History of Present Illness: Pt admit for LVAD placement.      Subjective Data      Prior Functioning       Cognition  Cognition Arousal/Alertness: Awake/alert Behavior During Therapy: WFL for tasks assessed/performed Overall Cognitive Status: Within Functional Limits for tasks assessed    Mobility  Bed Mobility Bed Mobility: Not assessed Transfers Transfers: Sit to Stand;Stand to Sit Sit to Stand: 3: Mod assist;Without upper extremity assist;From chair/3-in-1 Stand to Sit:  4: Min guard;With upper extremity assist;To chair/3-in-1 Details for Transfer  Assistance: Required 3 attempts to move into standing, must rock forward to gain momentum when standing from chair     Exercises      Balance     End of Session OT - End of Session Equipment Utilized During Treatment: Rolling walker;Other (comment) (LVAD equipment) Activity Tolerance: Patient limited by fatigue Patient left: in chair;with call bell/phone within reach;with family/visitor present Nurse Communication: Mobility status  [place], Ellard Artis M 11/05/2013, 12:03 PM

## 2013-11-05 NOTE — Progress Notes (Signed)
Pt amb 150 ft on 2L O2 pushing rolling walker. Upon return to room, pt fell on floor onto right side. Pt did not report dizziness or loss of consciousness.  Pt stated "I just lost my balance." Pt received skin tear to right forearm. Foam dressing applied. Pt did not report any hip or leg pain. Pt did not hit head. MAP 94. No PI events. SR on monitor. O2 sats 92% on 2L. Driveline intact and in place. MD notified. No new orders received. Pt placed back in chair with call bell within reach. Will continue to monitor pt closely.

## 2013-11-05 NOTE — Progress Notes (Signed)
ANTICOAGULATION CONSULT NOTE - Follow Up Consult  Pharmacy Consult for Coumadin Indication: atrial fibrillation / s/p LVAD 12/15  Allergies  Allergen Reactions  . Demerol [Meperidine]     Paralysis. Could only move eyes.  . Meperidine Hcl Other (See Comments)    "CAN'T MOVE" CATATONIC PER PT.  [name] Opium Other (See Comments)    "CAN'T MOVE" CATATONIC PER PT.    Patient Measurements: Height: 6' (182.9 cm) Weight: 203 lb 12.8 oz (92.443 kg) IBW/kg (Calculated) : 77.6  Vital Signs: Temp: 98.3 F (36.8 C) (01/01 0537) Temp src: Oral (01/01 0537) Pulse Rate: 72 (01/01 0800)  Labs:  Recent Labs  11/03/13 0455 11/04/13 0500 11/05/13 0455  HGB 9.4* 8.5* 8.4*  HCT 29.0* 26.4* 26.2*  PLT 346 294 316  LABPROT 24.1* 29.4* 28.2*  INR 2.24* 2.91* 2.76*  CREATININE 1.21 1.28 1.28    Estimated Creatinine Clearance: 55.6 ml/min (by C-G formula based on Cr of 1.28).   Medical History: Past Medical History  Diagnosis Date  . CAD (coronary artery disease)     s/p MI 1982;  s/p DES to CFX 2011;  s/p NSTEMI 4/12 in setting of VTach;  cath 7/12:  LM ok, LAD mid 30-40%, Dx's with 40%, mCFX stent patent, prox to mid RCA occluded with R to R and L to R collats.  Medical Tx was continued  . Bradycardia   . Ventricular tachycardia     s/p AICD;   h/o ICD shock;  Amiodarone Rx. S/P ablation in 2012  . 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 HTN (hypertension)   . HLD (hyperlipidemia)   . Cytopenia   . Hypothyroidism   . Renal insufficiency   . Claudication   . Chronic systolic heart failure   . Ischemic cardiomyopathy     echo 7/12:  EF 25-30%, mild AI, mod MR, mod LAE  . TIA (transient ischemic attack) 2001  . CVD (cerebrovascular disease)     left CEA 2008  . Stroke   . Anemia   . Inappropriate therapy from implantable cardioverter-defibrillator  04/02/2013    ATP for sinus tach  SVT wavelet identified SVT but was no  passive   . Myocardial infarction 1992  . Anginal pain   . Automatic implantable cardioverter-defibrillator in situ     [employer] Evera device serial number [id number]    . Sleep apnea     denies wearing mask (05/25/2013)  . Arthritis     "a little in my knees" (05/25/2013)  . Melanoma of ear 2013    "near left ear" (05/25/2013)    Assessment: 73 yo M with HF s/p LVAD placement 12/15.  Afib CVR 90s at rest but with runs of RVR up to 140s with standing and  walking.   Current tx amiodarone, digoxin, and ranolazine. Now s/p TEE/ DCCV on 12/31 and maintaining SR.  INR 2.76 today, cbc low stable watch, no bleeding noted, on iron supplement bid.  Goal of Therapy:  INR 2-3 Monitor platelets by anticoagulation protocol: Yes   Plan:  Coumadin 2 mg x 1 today Daily INR, CBC  Kriston Pasquarello, Pharm.D., BCPS Clinical Pharmacist Pager [phone] 11/05/2013 9:20 AM

## 2013-11-05 NOTE — Progress Notes (Signed)
Patient ID: [name], male   DOB: 1941-09-03, 73 y.o.   MRN: [id number]  HeartMate 2 Rounding Note  Subjective:    [name] is a 73 yo man with an ischemic CM s/p ICD implant, chronic systolic HF (EF 51-02%), paroxysmal VT s/p ablation (09/2011) and repeat (06/2013), CAD, PVD (s/p R fem pop BPG 01/2010; s/p L CFA BPG 05/2002), AAA (s/p repair 04/2002), CAS (s/p L CEA 2008), HTN, arthritis, and TIA. Recently admitted 12/8-12/11/14 for syncopal episode from inappropriate ATP which accelerated SVT>>VT with failed ATP and then spontaneous termination of VT. During hospital stay had repeat RHC, hemodynamics were stable and had TEE-DC-CV of AFL 10/15/13 which was successful. He was presented to our [facility] team and felt to be good LVAD candidate and then presented at [facility] transplant listing conference and they felt he was not a candidate for transplant, but good candidate for LVAD under DT criteria.   POD 17 HeartMate II LVAD   Remains on Milrinone 0.125 CO-ox 61.4% He had a good day yesterday after DCCV and feels better today. He walked this am around the nursing station and was tired and short of breath when he returned but is recovering quicker. His HR went up to 130's during the walk but back down now to 70's in sinus. PI reportedly had minimal drop and recovered.  LVAD INTERROGATION:  HeartMate II LVAD:  Flow 5.3 liters/min, speed 8400, power 4.8, PI 6.6.  Few PI events  Objective:    Vital Signs:   Temp:  [97.7 F (36.5 C)-98.6 F (37 C)] 98.3 F (36.8 C) (01/01 0537) Pulse Rate:  [70-86] 72 (01/01 0800) Resp:  [16] 16 (12/31 1358) SpO2:  [95 %-99 %] 99 % (01/01 0537) Weight:  [92.443 kg (203 lb 12.8 oz)] 92.443 kg (203 lb 12.8 oz) (01/01 0537) Last BM Date: 11/04/13 Mean arterial Pressure 74  Intake/Output:   Intake/Output Summary (Last 24 hours) at 11/05/13 1119 Last data filed at 11/05/13 1100  Gross per 24 hour  Intake 1416.67 ml  Output    200 ml  Net 1216.67 ml      Physical Exam: General: Awake and alert. Conversant. He doesn't look as tired as he has over the past several days.  HEENT: normal  Neck: supple. Carotids 2+ bilat; no bruits. No lymphadenopathy or thryomegaly appreciated.  Cor: Faint heart sounds , LVAD hum present.  Lungs: clear but decreased breath sounds at left base.  Abdomen: soft, nontender, nondistended. No hepatosplenomegaly. No bruits or masses. Active bowel sounds.  Driveline: C/D/I; securement device intact and driveline incorporated  Extremities: moderate peripheral edema up into thighs Neuro: alert & orientedx3, cranial nerves grossly intact. moves all 4 extremities w/o difficulty. Affect pleasant      Telemetry: sinus 70's  Labs: Basic Metabolic Panel:  Recent Labs Lab 11/01/13 0914 11/02/13 0500 11/03/13 0455 11/04/13 0500 11/05/13 0455  NA 132* 133* 134* 135* 136*  K 3.8 3.6 4.1 3.5* 3.7  CL 97 97 96 97 98  CO2 28 27 28 29 27   GLUCOSE 136* 106* 100* 99 102*  BUN 18 15 14 14 15   CREATININE 1.38* 1.27 1.21 1.28 1.28  CALCIUM 8.0* 8.0* 8.2* 7.9* 8.0*  MG  --  1.9  --  2.1  --     Liver Function Tests:  Recent Labs Lab 10/30/13 0500 11/01/13 0914  AST 85* 31  ALT 76* 40  ALKPHOS 118* 107  BILITOT 0.8 0.7  PROT 5.4* 5.0*  ALBUMIN 2.3*  2.2*   No results found for this basename: LIPASE, AMYLASE,  in the last 168 hours No results found for this basename: AMMONIA,  in the last 168 hours  CBC:  Recent Labs Lab 11/01/13 0914 11/02/13 0500 11/03/13 0455 11/04/13 0500 11/05/13 0455  WBC 10.6* 10.5 11.3* 9.2 7.9  HGB 8.8* 9.1* 9.4* 8.5* 8.4*  HCT 27.5* 28.1* 29.0* 26.4* 26.2*  MCV 94.8 95.3 95.4 94.6 94.6  PLT 316 339 346 294 316    INR:  Recent Labs Lab 11/01/13 0914 11/02/13 0500 11/03/13 0455 11/04/13 0500 11/05/13 0455  INR 3.06* 2.68* 2.24* 2.91* 2.76*   LDH 382  Other results:  EKG:   Imaging: Dg Chest Port 1 View  11/04/2013   CLINICAL DATA:  Ventricular assist  device in place.  EXAM: PORTABLE CHEST - 1 VIEW  COMPARISON:  11/02/2013.  FINDINGS: Left ventricular assist device is in place appearing unchanged in position.  AICD/sequential pacemaker enters from the left with tips in the region of the right atrium and right ventricle.  Right central line tip proximal to mid superior vena cava.  Cardiomegaly.  Consolidation left lung base may represent pleural fluid, atelectasis or infiltrate. Limited for evaluating for the possibly of underlying mass.  Pulmonary vascular prominence.  No gross pneumothorax.  Calcified mildly tortuous aorta.  Post median sternotomy.  IMPRESSION: No significant change.  Please see above.   Electronically Signed   By: Bridgett Larsson M.D.   On: 11/04/2013 06:58      Medications:     Scheduled Medications: . amiodarone  200 mg Oral BID  . aspirin EC  325 mg Oral Daily  . bisacodyl  10 mg Oral Daily   Or  . bisacodyl  10 mg Rectal Daily  . digoxin  0.125 mg Oral Daily  . docusate sodium  200 mg Oral Daily  . feeding supplement (ENSURE COMPLETE)  237 mL Oral BID BM  . ferrous gluconate  324 mg Oral BID WC  . guaiFENesin  600 mg Oral BID  . midodrine  5 mg Oral TID WC  . pantoprazole  40 mg Oral Daily  . ranolazine  500 mg Oral BID  . sodium chloride  3 mL Intravenous Q12H  . warfarin  2 mg Oral ONCE-1800  . Warfarin - Pharmacist Dosing Inpatient   Does not apply q1800     Infusions: . sodium chloride 20 mL/hr at 10/22/13 0800  . sodium chloride Stopped (10/28/13 0800)  . sodium chloride 250 mL (10/20/13 0600)  . sodium chloride 20 mL/hr at 11/04/13 0815  . sodium chloride    . milrinone 0.125 mcg/kg/min (11/04/13 1528)     PRN Medications:  acetaminophen, ondansetron (ZOFRAN) IV, sodium chloride, sodium chloride, sodium chloride, traMADol   Assessment:   1) Chronic systolic HF, Class IV, EF 20-25%  --s/p HM II VAD implant 12/15  2) NICM/ICM  3) Recurrent Vtach  - ablation 09/2011 and 06/2013  4) Atrial  flutter  - s/p DC-CV 10/15/2013  5) CAD with recanalized occluded RCA with left to right collat.  6) PAD: s/p aorto-bifem, bilateral renal artery bypass, bilateral fem-pop, and left CEA.  7) Severe RV dysfunction by 2D echo 10/31/2013    Plan/Discussion:    He continues to look better on milrinone and in sinus rhythm. He has positive fluid balance and wt up 1 lb. He is probably 15 lbs over his baseline. His shortness of breath and fatigue with exertion is due to this volume excess,  left pleural effusion with  LLL atelectasis, and debilitation from preop heart failure and long postop course. I agree with resuming diuresis tomorrow. He may need left thoracentesis at some point. He is eating much better now and that will help.  I reviewed the LVAD parameters from today, and compared the results to the patient's prior recorded data.  No programming changes were made.  The LVAD is functioning within specified parameters.  The patient performs LVAD self-test daily.  LVAD interrogation was negative for any significant power changes, alarms or PI events/speed drops.  LVAD equipment check completed and is in good working order.  Back-up equipment present.   LVAD education done on emergency procedures and precautions and reviewed exit site care.  Length of Stay: 18  BARTLE,BRYAN K 11/05/2013, 11:19 AM  VAD Team Pager [phone] (7am - 7am)

## 2013-11-06 ENCOUNTER — Encounter (HOSPITAL_COMMUNITY): Payer: Self-pay | Admitting: Cardiology

## 2013-11-06 LAB — COMPREHENSIVE METABOLIC PANEL
ALT: 26 U/L (ref 0–53)
AST: 29 U/L (ref 0–37)
Albumin: 2.1 g/dL — ABNORMAL LOW (ref 3.5–5.2)
Alkaline Phosphatase: 103 U/L (ref 39–117)
BILIRUBIN TOTAL: 0.5 mg/dL (ref 0.3–1.2)
BUN: 14 mg/dL (ref 6–23)
CHLORIDE: 98 meq/L (ref 96–112)
CO2: 28 mEq/L (ref 19–32)
CREATININE: 1.15 mg/dL (ref 0.50–1.35)
Calcium: 7.9 mg/dL — ABNORMAL LOW (ref 8.4–10.5)
GFR calc Af Amer: 71 mL/min — ABNORMAL LOW (ref >90)
GFR calc non Af Amer: 61 mL/min — ABNORMAL LOW (ref >90)
Glucose, Bld: 93 mg/dL (ref 70–99)
Potassium: 4 mEq/L (ref 3.7–5.3)
Sodium: 136 mEq/L — ABNORMAL LOW (ref 137–147)
Total Protein: 5.2 g/dL — ABNORMAL LOW (ref 6.0–8.3)

## 2013-11-06 LAB — LACTATE DEHYDROGENASE: LDH: 363 U/L — ABNORMAL HIGH (ref 94–250)

## 2013-11-06 LAB — CULTURE, BLOOD (ROUTINE X 2)
Culture: NO GROWTH
Culture: NO GROWTH

## 2013-11-06 LAB — CBC
HEMATOCRIT: 27.2 % — AB (ref 39.0–52.0)
Hemoglobin: 8.8 g/dL — ABNORMAL LOW (ref 13.0–17.0)
MCH: 30.8 pg (ref 26.0–34.0)
MCHC: 32.4 g/dL (ref 30.0–36.0)
MCV: 95.1 fL (ref 78.0–100.0)
Platelets: 327 10*3/uL (ref 150–400)
RBC: 2.86 MIL/uL — ABNORMAL LOW (ref 4.22–5.81)
RDW: 17.6 % — ABNORMAL HIGH (ref 11.5–15.5)
WBC: 7 10*3/uL (ref 4.0–10.5)

## 2013-11-06 LAB — CARBOXYHEMOGLOBIN
CARBOXYHEMOGLOBIN: 2.2 % — AB (ref 0.5–1.5)
Carboxyhemoglobin: 1.9 % — ABNORMAL HIGH (ref 0.5–1.5)
Carboxyhemoglobin: 1.9 % — ABNORMAL HIGH (ref 0.5–1.5)
METHEMOGLOBIN: 0.6 % (ref 0.0–1.5)
METHEMOGLOBIN: 1.4 % (ref 0.0–1.5)
Methemoglobin: 0.7 % (ref 0.0–1.5)
O2 SAT: 54.7 %
O2 SAT: 57.2 %
O2 Saturation: 53.5 %
Total hemoglobin: 9.2 g/dL — ABNORMAL LOW (ref 13.5–18.0)
Total hemoglobin: 9.6 g/dL — ABNORMAL LOW (ref 13.5–18.0)
Total hemoglobin: 9.2 g/dL — ABNORMAL LOW (ref 13.5–18.0)

## 2013-11-06 LAB — PROTIME-INR
INR: 2.27 — ABNORMAL HIGH (ref 0.00–1.49)
Prothrombin Time: 24.3 seconds — ABNORMAL HIGH (ref 11.6–15.2)

## 2013-11-06 MED ORDER — TORSEMIDE 20 MG PO TABS
20.0000 mg | ORAL_TABLET | Freq: Once | ORAL | Status: DC
Start: 2013-11-06 — End: 2013-11-06
  Filled 2013-11-06: qty 1

## 2013-11-06 MED ORDER — WARFARIN SODIUM 1 MG PO TABS
1.5000 mg | ORAL_TABLET | Freq: Every day | ORAL | Status: DC
  Administered 2013-11-06: 1.5 mg via ORAL
  Filled 2013-11-06 (×2): qty 1

## 2013-11-06 MED ORDER — POTASSIUM CHLORIDE CRYS ER 20 MEQ PO TBCR
20.0000 meq | EXTENDED_RELEASE_TABLET | Freq: Every day | ORAL | Status: DC
  Administered 2013-11-06 – 2013-11-16 (×11): 20 meq via ORAL
  Filled 2013-11-06 (×13): qty 1

## 2013-11-06 MED ORDER — TORSEMIDE 20 MG PO TABS
20.0000 mg | ORAL_TABLET | Freq: Every day | ORAL | Status: DC
  Administered 2013-11-06: 20 mg via ORAL
  Filled 2013-11-06 (×2): qty 1

## 2013-11-06 NOTE — Progress Notes (Signed)
CSW met with patient at bedside. Patient reports that he is feeling well and was able to walk quite a bit this morning. He reports that he had a fall yesterday and has a cut on his right forearm. He denies any other injury from fall. He states that his SO [name] will visit later this morning and she continues to be a strong support for his recovery. Patient appears to be in good spirits, motivated for recovery with good support. CSW will continue to follow through out recovery. [name], [place]

## 2013-11-06 NOTE — Progress Notes (Signed)
CARDIAC REHAB PHASE I   PRE:  Rate/Rhythm: 70 paced  BP:  Supine:   Sitting: 80 dopplered  Standing:    SaO2: 96%2L  MODE:  Ambulation: 260 ft   POST:  Rate/Rhythm: 86 SR  BP:  Supine:   Sitting: 96 dopplered  Standing:    SaO2: 89%2L [id number] Talked with pt about taking it slow and sitting when tired since pt had loss of balance yesterday. Pt walked with rollator on oxygen on 2L with gait belt use and asst x 1 and one asst to follow with wheelchair. Pt sat once to rest and took a couple of standing rest breaks. Did c/o right knee swollen and sore. Much larger than left. Lowest PI during walk that was visualized was 6.3. To recliner with legs on pillow after walk. Pt tolerated well. Still requiring oxygen to keep sats up. Pt able to maintain equipment with little supervision. Walked 260 ft on 2L.   Graylon Good, RN BSN  11/06/2013 9:52 AM

## 2013-11-06 NOTE — Progress Notes (Addendum)
Patient ID: [name], male   DOB: [birth year], 73 y.o.   MRN: [id number]  HeartMate 2 Rounding Note  Subjective:    [name] is a 73 yo man with an ischemic CM s/p ICD implant, chronic systolic HF (EF 62-37%), paroxysmal VT s/p ablation (09/2011) and repeat (06/2013), CAD, PVD (s/p R fem pop BPG 01/2010; s/p L CFA BPG 05/2002), AAA (s/p repair 04/2002), CAS (s/p L CEA 2008), HTN, arthritis, and TIA. Recently admitted 12/8-12/11/14 for syncopal episode from inappropriate ATP which accelerated SVT>>VT with failed ATP and then spontaneous termination of VT. During hospital stay had repeat RHC, hemodynamics were stable and had TEE-DC-CV of AFL 10/15/13 which was successful. He was presented to our [facility] team and felt to be good LVAD candidate and then presented at [facility] transplant listing conference and they felt he was not a candidate for transplant, but good candidate for LVAD under DT criteria.   Echo 12/27: LV is small with EF 15%. RV not significantly dilated but severely HK.  12/30: Milrinone begun with decreased Coox and torsemide stopped with PI drops.  12/31: TEE-guided DCCV back to NSR.   Since cardioversion, he has been doing better.  He is maintaining NSR.  No longer dropping PI when standing. Walked two laps this morning.  Some dyspnea.  Increased lower extremity swelling and weight is up.   LDH 398->354->382->363 Co-ox 64%->57%->51%->61%->55%  UA (12/26): ok BCx (12/26) : NGTD  LVAD INTERROGATION:  HeartMate II LVAD:  Flow 4.3  liters/min, speed 8400, power 4.4, PI 7.3.   Only 1 PI event since last interrogated.   Objective:    Vital Signs:   Temp:  [98.3 F (36.8 C)-99.2 F (37.3 C)] 98.3 F (36.8 C) (01/01 1951) Pulse Rate:  [70] 70 (01/02 0800) SpO2:  [100 %] 100 % (01/01 1951) Weight:  [93.1 kg (205 lb 4 oz)] 93.1 kg (205 lb 4 oz) (01/02 0800) Last BM Date: 11/04/13  Intake/Output:   Intake/Output Summary (Last 24 hours) at 11/06/13 1052 Last data filed at  11/05/13 1951  Gross per 24 hour  Intake    240 ml  Output    800 ml  Net   -560 ml     Physical Exam: General: Sitting in chair. Looks better this am HEENT: normal  Neck: supple. JVP 10. Carotids 2+ bilat; no bruits. No lymphadenopathy or thryomegaly appreciated.  Cor: Faint heart sounds , LVAD hum present.  Lungs: Decreased BS at left base.   Abdomen: soft, nontender, nondistended. No hepatosplenomegaly. No bruits or masses. Active bowel sounds.  Driveline: C/D/I; securement device intact and driveline incorporated  Extremities: warm. TED hose in place  2+ edema R>L lower leg to knees Neuro: alert & orientedx3, cranial nerves grossly intact. moves all 4 extremities w/o difficulty. Affect pleasant    Telemetry: a-paced, v-sensed 70  Labs: Basic Metabolic Panel:  Recent Labs Lab 11/02/13 0500 11/03/13 0455 11/04/13 0500 11/05/13 0455 11/06/13 0610  NA 133* 134* 135* 136* 136*  K 3.6 4.1 3.5* 3.7 4.0  CL 97 96 97 98 98  CO2 27 28 29 27 28   GLUCOSE 106* 100* 99 102* 93  BUN 15 14 14 15 14   CREATININE 1.27 1.21 1.28 1.28 1.15  CALCIUM 8.0* 8.2* 7.9* 8.0* 7.9*  MG 1.9  --  2.1  --   --     Liver Function Tests:  Recent Labs Lab 11/01/13 0914 11/06/13 0610  AST 31 29  ALT 40 26  ALKPHOS 107  103  BILITOT 0.7 0.5  PROT 5.0* 5.2*  ALBUMIN 2.2* 2.1*   No results found for this basename: LIPASE, AMYLASE,  in the last 168 hours No results found for this basename: AMMONIA,  in the last 168 hours  CBC:  Recent Labs Lab 11/02/13 0500 11/03/13 0455 11/04/13 0500 11/05/13 0455 11/06/13 0610  WBC 10.5 11.3* 9.2 7.9 7.0  HGB 9.1* 9.4* 8.5* 8.4* 8.8*  HCT 28.1* 29.0* 26.4* 26.2* 27.2*  MCV 95.3 95.4 94.6 94.6 95.1  PLT 339 346 294 316 327    INR:  Recent Labs Lab 11/02/13 0500 11/03/13 0455 11/04/13 0500 11/05/13 0455 11/06/13 0610  INR 2.68* 2.24* 2.91* 2.76* 2.27*   Other results:    Imaging: No results found.   Medications:     Scheduled  Medications: . amiodarone  200 mg Oral BID  . aspirin EC  325 mg Oral Daily  . bisacodyl  10 mg Oral Daily   Or  . bisacodyl  10 mg Rectal Daily  . digoxin  0.125 mg Oral Daily  . docusate sodium  200 mg Oral Daily  . feeding supplement (ENSURE COMPLETE)  237 mL Oral BID BM  . ferrous gluconate  324 mg Oral BID WC  . guaiFENesin  600 mg Oral BID  . midodrine  5 mg Oral TID WC  . pantoprazole  40 mg Oral Daily  . potassium chloride  20 mEq Oral Daily  . ranolazine  500 mg Oral BID  . sodium chloride  3 mL Intravenous Q12H  . torsemide  20 mg Oral Daily  . warfarin  1.5 mg Oral q1800  . Warfarin - Pharmacist Dosing Inpatient   Does not apply q1800    Infusions: . sodium chloride 20 mL/hr at 10/22/13 0800  . sodium chloride 10 mL/hr (11/05/13 2058)  . sodium chloride 250 mL (10/20/13 0600)  . sodium chloride 20 mL/hr at 11/04/13 0815  . sodium chloride    . milrinone 0.125 mcg/kg/min (11/05/13 2058)    PRN Medications: acetaminophen, ondansetron (ZOFRAN) IV, sodium chloride, sodium chloride, sodium chloride, traMADol   Assessment:   1) Chronic systolic HF, Class IV, EF 20-25%  --s/p HM II VAD implant 12/15  2) NICM/ICM  3) Recurrent Vtach  - ablation 09/2011 and 06/2013  4) Atrial flutter  - s/p DC-CV 10/15/2013  - Atrial fibrillation recurred post-op.  - TEE-guided DCCV 12/31 5) CAD with recanalized occluded RCA with left to right collat.  6) PAD: s/p aorto-bifem, bilateral renal artery bypass, bilateral fem-pop, and left CEA.  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     Plan/Discussion:    Patient seems overall improved in NSR.  PI is higher and did not fall this morning when he stood up.   He is tolerating milrinone @ 0.125 for RV support with MAP in the 70s. Co-ox was lower early this morning. Would like to avoid increasing milrinone gtt.  I am going to repeat Co-ox now.  I hope that eventually we can increase his pump speed.  I think we can aim for ramp study on Monday.   He is more volume overloaded and weight is up.  I am going to restart torsemide 20 mg daily.  Hopefully he can tolerate this without PI drops now that he is back in NSR.   He is now on bid amiodarone and I started him on ranolazine 500 bid after discussion with Dr. Caryl Comes to use as adjunct with amiodarone to try to maintain NSR.  QT ok on ECG yesterday.     No VT.  Patient had been having atrial fibrillation with aberrancy when HR rises.   INR ok today. Given low VAD speed need to try to keep INR therapeutic.   Will continue midodrine for now.   Continue to work with Cardiac Rehab.  I reviewed the LVAD parameters from today, and compared the results to the patient's prior recorded data.  No programming changes were made.  The LVAD is functioning within specified parameters.  The patient performs LVAD self-test daily.  LVAD interrogation was negative for any significant power changes, alarms or PI events/speed drops.  LVAD equipment check completed and is in good working order.  Back-up equipment present.   LVAD education done on emergency procedures and precautions and reviewed exit site care.  Length of Stay: 53  Loralie Champagne MD 11/06/2013, 10:52 AM  Repeat co-ox 53%.  I am going to increase milrinone to 0.25.  Will follow MAP closely with this change.   Loralie Champagne 11/06/2013

## 2013-11-06 NOTE — Care Management Note (Unsigned)
Page 1 of 2   11/17/2013     4:34:18 PM   CARE MANAGEMENT NOTE 11/17/2013  Patient:  [name], [name]   Account Number:  [id number]  Date Initiated:  10/20/2013  Documentation initiated by:  Luz Lex  Subjective/Objective Assessment:   Lvad on 12-15.     Action/Plan:   Anticipated DC Date:  11/18/2013   Anticipated DC Plan:  [place]  CM consult      [facility] Choice  HOME HEALTH   Choice offered to / List presented to:  C-1 Patient   DME arranged  IV PUMP/EQUIPMENT      DME agency  OTHER - SEE NOTE     [place] arranged  HH-1 RN  [place]   Status of service:  In process, will continue to follow Medicare Important Message given?   (If response is "NO", the following Medicare IM given date fields will be blank) Date Medicare IM given:   Date Additional Medicare IM given:    Discharge Disposition:    Per UR Regulation:  Reviewed for med. necessity/level of care/duration of stay  If discussed at [place] of Stay Meetings, dates discussed:   10/22/2013  10/27/2013  11/10/2013  11/12/2013  11/17/2013    Comments:  Contact:  Ray,[name] Significant other [phone]  11/17/13 Dquan Cortopassi,RN,BSN [phone] PER DR Haroldine Laws, MD WISHES TO CHANGE HOME MILRINONE SUPPLIER TO [facility], AND DISCONTINUE WORKING WITH [facility]. NOTIFIED [place] OF NEW REFERRAL; [facility] MADE AWARE OF CHANGE BY MARY WITH [facility].  [facility] WILL STILL PROVIDE HOME CARE AS PREVIOUSLY ARRANGED.  11/13/13 Kegan Mckeithan,RN,BSN [phone] PT WILL NEED HOME MILRINONE AT DC; CONTACTED [facility] WHO IN TURN CONTACTED [place] REP [place].  [facility] WILL HANDLE MILRINONE PUMP AND MED FOR PT UPON DC, AND PROVIDE TEACHING FOR PT AND GF ON MONDAY, 1/12.  CONTACT FOR [facility] INF[facility]ION [facility] IS Alicia Amel, RN, BSN, CELL [phone].  11/11/13 Antoine Primas [phone] Per Rep at [facility] Tier 3 Medication, if [facility] uses [facility] the copay will be $40 for a 30 day supply, if any other pharmacy is used there will be $45 copay. No Josem Kaufmann is required.  11/11/13 Wanita Derenzo,RN,BSN [phone] MET WITH PT AND SIGNIF OTHER TO FINALIZE DC PLANS AND HOME NEEDS.  PT NEEDS ONLY ROLLATOR AND HOME O2 AT DC. (QUALIFIES WITH [employer] TODAY).  MEDICARE ONLY PAYS FOR ONE WALKER IN 5 YEAR PERIOD, AND PT OBTAINED WALKER IN 3/11 WITH MEDICARE. COST OF ROLLATOR THROUGH [facility] IS $152.51. RESEARCHED OTHER OPTIONS FOR PT ONLINE; [email or web address] HAS ROLLATOR FOR $75.99 +SHIPPING.  DISCUSSED WITH PT AND HE STATES HE WILL LIKELY GO THIS ROUTE.  DENIES NEED FOR BSC. PT IS FOLLOWED BY [facility] HH CARE :  WILL NEED ORDERS FOR HHRN, HHPT, AND HHOT PRIOR TO DC.  WILL CHECK COVERAGE FOR [facility], PER REQUEST OF VAD COORDINATOR.  11/06/13 Davianna Deutschman,RN,BSN [phone] PT PROGRESSING WELL S/P LVAD PLACEMENT.  PT WILL NEED HHRN, PT, AND OT AT DC.  [facility] HH FOLLOWING TO PROVIDE HH FOLLOW UP AT DC.  WILL ARRANGE DME AS REQUESTED.  10-27-13 9am Luz Lex, RNBSN [phone] Continue VAD adjustments - Run of VT requiring amio bolus 12-22.  Up walking for first time.  PT recommending home with HH.   Has [facility] in past.  10-15-13 1:20pm Luz Lex,  RNBSN - [phone] Talked with patient and SO, [name] at bedside.  [name] states she will be taking FMLA from her work to be home with him for however long that will be.  CM will continue to follow for further needs.

## 2013-11-06 NOTE — Progress Notes (Signed)
Physical Therapy Treatment Patient Details Name: [name] MRN: [id number] DOB: 1940/11/30 Today's Date: 11/06/2013 Time: 2694-8546 PT Time Calculation (min): 24 min  PT Assessment / Plan / Recommendation  History of Present Illness Pt admit for LVAD placement.     PT Comments   Patient able to switch to and from batteries independently today.  Doing well with ambulation with RW.  Needs to work on monitoring his fatigue and taking appropriate rest breaks.   Follow Up Recommendations  Home health PT;Supervision/Assistance - 24 hour     Does the patient have the potential to tolerate intense rehabilitation     Barriers to Discharge        Equipment Recommendations  None recommended by PT    Recommendations for Other Services    Frequency Min 5X/week   Progress towards PT Goals Progress towards PT goals: Progressing toward goals  Plan Current plan remains appropriate    Precautions / Restrictions Precautions Precautions: Sternal;Fall Precaution Comments: LVAD; closely monitor PI as dropping when changing positions Required Braces or Orthoses: Other Brace/Splint Other Brace/Splint: strap for controller; vest for batteries; needs to take bag with back up supplies with him whenever he leaves his room Restrictions Weight Bearing Restrictions: Yes Other Position/Activity Restrictions: sternal precautions   Pertinent Vitals/Pain PI at 5.6 during gait today    Mobility  Bed Mobility Bed Mobility: Sit to Sidelying Right Sit to Sidelying Right: 4: Min assist;HOB elevated Details for Bed Mobility Assistance: Patient hugging pillow with bil. UE's.  Moves to Rt sidelying with assist to bring LE's onto bed.  Patient rolls to supine independently, still hugging pillow to prevent use of UE's. Transfers Transfers: Sit to Stand;Stand to Sit Sit to Stand: 3: Mod assist;Without upper extremity assist;From chair/3-in-1 Stand to Sit: 4: Min guard;Without upper extremity assist;To  bed Details for Transfer Assistance: Patient scooted to edge of chair independently.  Hugging pillow to prevent use of UE's. Required 2 attempts to stand from chair with mod assist.  Stood for a minute for MD to assess.  Used proper technique to return to sitting on bed. Ambulation/Gait Ambulation/Gait Assistance: 4: Min assist (+1 for lines/O2 tank) Ambulation Distance (Feet): 180 Feet Assistive device: Rolling walker Ambulation/Gait Assistance Details: Verbal cues to stand upright. Patient with flexed posture. Patient required 3 standing rest breaks.  Toward end of ambulation, patient with dyspnea 3/4 and notably fatigued with loss of balance.  Instructed patient to stop and sit to rest when he gets that fatigued.  Don't try to make it to chair/bed just because he is "almost there".  PI noted at 5.6 during gait.  Patient monitoring. Gait Pattern: Step-through pattern;Decreased stride length;Trunk flexed    Exercises Other Exercises Other Exercises: Patient able to ask for all equipment needed to switch to batteries.  Able to make switch independently.   Asked for bag to take with [place] for ambulation.  Patient able to list supplies needed in bag.  Patient able to switch off of batteries at end of session.     PT Goals (current goals can now be found in the care plan section)    Visit Information  Last PT Received On: 11/06/13 Assistance Needed: +2 (+1 for lines/chair) History of Present Illness: Pt admit for LVAD placement.      Subjective Data  Subjective: "I fell yesterday.  I just lost my balance"   Cognition  Cognition Arousal/Alertness: Awake/alert Behavior During Therapy: WFL for tasks assessed/performed Overall Cognitive Status: Within Functional Limits for tasks  assessed    Balance     End of Session PT - End of Session Equipment Utilized During Treatment: Gait belt;Oxygen Activity Tolerance: Patient limited by fatigue (Fatigued at end of session) Patient left: in bed;with  call bell/phone within reach;with family/visitor present Nurse Communication: Mobility status   GP     Despina Pole 11/06/2013, 3:49 PM Carita Pian. Sanjuana Kava, [place] Pager [phone]

## 2013-11-06 NOTE — Progress Notes (Signed)
ANTICOAGULATION CONSULT NOTE - Follow Up Consult  Pharmacy Consult for Coumadin Indication: atrial fibrillation / s/p LVAD 12/15  Allergies  Allergen Reactions  . Demerol [Meperidine]     Paralysis. Could only move eyes.  . Meperidine Hcl Other (See Comments)    "CAN'T MOVE" CATATONIC PER PT.  [name] Opium Other (See Comments)    "CAN'T MOVE" CATATONIC PER PT.    Patient Measurements: Height: 6' (182.9 cm) Weight: 205 lb 4 oz (93.1 kg) IBW/kg (Calculated) : 77.6  Vital Signs: Pulse Rate: 70 (01/02 0800)  Labs:  Recent Labs  11/04/13 0500 11/05/13 0455 11/06/13 0610  HGB 8.5* 8.4* 8.8*  HCT 26.4* 26.2* 27.2*  PLT 294 316 327  LABPROT 29.4* 28.2* 24.3*  INR 2.91* 2.76* 2.27*  CREATININE 1.28 1.28 1.15    Estimated Creatinine Clearance: 61.9 ml/min (by C-G formula based on Cr of 1.15).   Medical History: Past Medical History  Diagnosis Date  . CAD (coronary artery disease)     s/p MI 1982;  s/p DES to CFX 2011;  s/p NSTEMI 4/12 in setting of VTach;  cath 7/12:  LM ok, LAD mid 30-40%, Dx's with 40%, mCFX stent patent, prox to mid RCA occluded with R to R and L to R collats.  Medical Tx was continued  . Bradycardia   . Ventricular tachycardia     s/p AICD;   h/o ICD shock;  Amiodarone Rx. S/P ablation in 2012  . 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 HTN (hypertension)   . HLD (hyperlipidemia)   . Cytopenia   . Hypothyroidism   . Renal insufficiency   . Claudication   . Chronic systolic heart failure   . Ischemic cardiomyopathy     echo 7/12:  EF 25-30%, mild AI, mod MR, mod LAE  . TIA (transient ischemic attack) 2001  . CVD (cerebrovascular disease)     left CEA 2008  . Stroke   . Anemia   . Inappropriate therapy from implantable cardioverter-defibrillator 04/02/2013    ATP for sinus tach  SVT wavelet identified SVT but was no   passive   . Myocardial infarction 1992  . Anginal pain   . Automatic implantable cardioverter-defibrillator in situ     Medtronic Evera device serial number [id number]    . Sleep apnea     denies wearing mask (05/25/2013)  . Arthritis     "a little in my knees" (05/25/2013)  . Melanoma of ear 2013    "near left ear" (05/25/2013)    Assessment: 73 yo M with HF s/p LVAD placement 12/15.  Afib CVR 90s at rest but with runs of RVR up to 140s with standing and  walking.   Current tx amiodarone, digoxin, and ranolazine. Now s/p TEE/ DCCV on 12/31 and maintaining SR.  INR 2.27 today, cbc low stable watch, no bleeding noted, on iron supplement bid.  Goal of Therapy:  INR 2-3 Monitor platelets by anticoagulation protocol: Yes   Plan:  Coumadin 1.5 mg daily Daily INR, CBC  Bonnita Nasuti Pharm.D. CPP, BCPS Clinical Pharmacist [phone] 11/06/2013 9:46 AM

## 2013-11-06 NOTE — Progress Notes (Signed)
LVAD driveline dressing changed by significant other using sterile technique.  Skin around site intact. Area around driveline was pink. Scant amount of dried blood on silver. Area cleansed and silver reapplied. Dressing replaced and secured. Will continue to monitor pt closely.

## 2013-11-07 DIAGNOSIS — I5023 Acute on chronic systolic (congestive) heart failure: Secondary | ICD-10-CM

## 2013-11-07 DIAGNOSIS — Z95818 Presence of other cardiac implants and grafts: Secondary | ICD-10-CM

## 2013-11-07 LAB — BASIC METABOLIC PANEL
BUN: 14 mg/dL (ref 6–23)
CO2: 28 mEq/L (ref 19–32)
Calcium: 8 mg/dL — ABNORMAL LOW (ref 8.4–10.5)
Chloride: 97 mEq/L (ref 96–112)
Creatinine, Ser: 1.24 mg/dL (ref 0.50–1.35)
GFR calc Af Amer: 64 mL/min — ABNORMAL LOW (ref >90)
GFR calc non Af Amer: 56 mL/min — ABNORMAL LOW (ref >90)
Glucose, Bld: 89 mg/dL (ref 70–99)
Potassium: 3.9 mEq/L (ref 3.7–5.3)
Sodium: 136 mEq/L — ABNORMAL LOW (ref 137–147)

## 2013-11-07 LAB — PROTIME-INR
INR: 2.18 — ABNORMAL HIGH (ref 0.00–1.49)
Prothrombin Time: 23.6 seconds — ABNORMAL HIGH (ref 11.6–15.2)

## 2013-11-07 LAB — CBC
HEMATOCRIT: 26.2 % — AB (ref 39.0–52.0)
HEMOGLOBIN: 8.6 g/dL — AB (ref 13.0–17.0)
MCH: 30.4 pg (ref 26.0–34.0)
MCHC: 32.8 g/dL (ref 30.0–36.0)
MCV: 92.6 fL (ref 78.0–100.0)
Platelets: 364 10*3/uL (ref 150–400)
RBC: 2.83 MIL/uL — AB (ref 4.22–5.81)
RDW: 17.4 % — ABNORMAL HIGH (ref 11.5–15.5)
WBC: 7.1 10*3/uL (ref 4.0–10.5)

## 2013-11-07 LAB — CARBOXYHEMOGLOBIN
Carboxyhemoglobin: 2.3 % — ABNORMAL HIGH (ref 0.5–1.5)
METHEMOGLOBIN: 1.4 % (ref 0.0–1.5)
O2 Saturation: 55.9 %
Total hemoglobin: 10.7 g/dL — ABNORMAL LOW (ref 13.5–18.0)

## 2013-11-07 LAB — LACTATE DEHYDROGENASE: LDH: 381 U/L — ABNORMAL HIGH (ref 94–250)

## 2013-11-07 MED ORDER — TORSEMIDE 20 MG/2ML IV SOLN
20.0000 mg | Freq: Every day | INTRAVENOUS | Status: DC
  Filled 2013-11-07 (×2): qty 2

## 2013-11-07 MED ORDER — WARFARIN SODIUM 2 MG PO TABS
2.0000 mg | ORAL_TABLET | Freq: Once | ORAL | Status: AC
  Administered 2013-11-07: 2 mg via ORAL
  Filled 2013-11-07: qty 1

## 2013-11-07 MED ORDER — POTASSIUM CHLORIDE CRYS ER 20 MEQ PO TBCR
40.0000 meq | EXTENDED_RELEASE_TABLET | Freq: Once | ORAL | Status: AC
Start: 2013-11-07 — End: 2013-11-07
  Administered 2013-11-07: 40 meq via ORAL
  Filled 2013-11-07: qty 2

## 2013-11-07 MED ORDER — TORSEMIDE 20 MG PO TABS
20.0000 mg | ORAL_TABLET | Freq: Two times a day (BID) | ORAL | Status: DC
  Administered 2013-11-07 – 2013-11-08 (×3): 20 mg via ORAL
  Filled 2013-11-07 (×6): qty 1

## 2013-11-07 NOTE — Progress Notes (Signed)
ANTICOAGULATION CONSULT NOTE - Follow Up Consult  Pharmacy Consult for Coumadin Indication: atrial fibrillation / s/p LVAD 12/15  Allergies  Allergen Reactions  . Demerol [Meperidine]     Paralysis. Could only move eyes.  . Meperidine Hcl Other (See Comments)    "CAN'T MOVE" CATATONIC PER PT.  [name] Opium Other (See Comments)    "CAN'T MOVE" CATATONIC PER PT.    Patient Measurements: Height: 6' (182.9 cm) Weight: 206 lb 5.6 oz (93.6 kg) (scale a) IBW/kg (Calculated) : 77.6  Vital Signs: Temp: 97.6 F (36.4 C) (01/03 0615) Temp src: Oral (01/03 0615) Pulse Rate: 74 (01/03 0800)  Labs:  Recent Labs  11/05/13 0455 11/06/13 0610 11/07/13 0537  HGB 8.4* 8.8* 8.6*  HCT 26.2* 27.2* 26.2*  PLT 316 327 364  LABPROT 28.2* 24.3* 23.6*  INR 2.76* 2.27* 2.18*  CREATININE 1.28 1.15 1.24    Estimated Creatinine Clearance: 62.1 ml/min (by C-G formula based on Cr of 1.24).   Medical History: Past Medical History  Diagnosis Date  . CAD (coronary artery disease)     s/p MI 1982;  s/p DES to CFX 2011;  s/p NSTEMI 4/12 in setting of VTach;  cath 7/12:  LM ok, LAD mid 30-40%, Dx's with 40%, mCFX stent patent, prox to mid RCA occluded with R to R and L to R collats.  Medical Tx was continued  . Bradycardia   . Ventricular tachycardia     s/p AICD;   h/o ICD shock;  Amiodarone Rx. S/P ablation in 2012  . 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 HTN (hypertension)   . HLD (hyperlipidemia)   . Cytopenia   . Hypothyroidism   . Renal insufficiency   . Claudication   . Chronic systolic heart failure   . Ischemic cardiomyopathy     echo 7/12:  EF 25-30%, mild AI, mod MR, mod LAE  . TIA (transient ischemic attack) 2001  . CVD (cerebrovascular disease)     left CEA 2008  . Stroke   . Anemia   . Inappropriate therapy from implantable  cardioverter-defibrillator 04/02/2013    ATP for sinus tach  SVT wavelet identified SVT but was no  passive   . Myocardial infarction 1992  . Anginal pain   . Automatic implantable cardioverter-defibrillator in situ     Medtronic Evera device serial number [id number]    . Sleep apnea     denies wearing mask (05/25/2013)  . Arthritis     "a little in my knees" (05/25/2013)  . Melanoma of ear 2013    "near left ear" (05/25/2013)    Assessment: 73 yo M with HF s/p LVAD placement 12/15.  Afib CVR 70s at rest but with runs of RVR up to 140s with standing and  walking. Current tx amiodarone, digoxin, and ranolazine. Now s/p TEE/ DCCV on 12/31 and maintaining SR.  INR 2.1 today, cbc low stable watch, no bleeding noted, on iron supplement bid.  Goal of Therapy:  INR 2-3 Monitor platelets by anticoagulation protocol: Yes   Plan:  Agree with warfarin 2mg  tonight per PVT Daily INR, CBC  Erin Hearing PharmD., BCPS Clinical Pharmacist Pager [phone] 11/07/2013 11:04 AM

## 2013-11-07 NOTE — Progress Notes (Signed)
Patient ID: [name], male   DOB: 04-09-1941, 73 y.o.   MRN: [id number]  HeartMate 2 Rounding Note  Subjective:    [name] is a 73 yo man with an ischemic CM s/p ICD implant, chronic systolic HF (EF 73-53%), paroxysmal VT s/p ablation (09/2011) and repeat (06/2013), CAD, PVD (s/p R fem pop BPG 01/2010; s/p L CFA BPG 05/2002), AAA (s/p repair 04/2002), CAS (s/p L CEA 2008), HTN, arthritis, and TIA. Recently admitted 12/8-12/11/14 for syncopal episode from inappropriate ATP which accelerated SVT>>VT with failed ATP and then spontaneous termination of VT. During hospital stay had repeat RHC, hemodynamics were stable and had TEE-DC-CV of AFL 10/15/13 which was successful. He was presented to our [facility] team and felt to be good LVAD candidate and then presented at [facility] transplant listing conference and they felt he was not a candidate for transplant, but good candidate for LVAD under DT criteria.   POD 19 HeartMate II LVAD   Remains on Milrinone 0.25 CO-ox 55% He had a good day today. He complains mainly of edema. His oral diuretic has been restarted and now is 20 mg Demadex by mouth twice a day. His pump flow was 4.5 L and PI runs 6.0-7. Last night his heart rate became slow irregular and his permanent pacemaker will need to be reprogrammed on Monday to maintain heart rate greater than 80 peak  because of RV dysfunction. Pump speed was increased to 8600 rpm's to assist with diuresis now that PI has been high for the past 2 days   LVAD INTERROGATION:  HeartMate II LVAD:  Flow 4.8 liters/min, speed 8600, power 4.8, PI 6.6.  1 PI events  Objective:    Vital Signs:   Temp:  [97.6 F (36.4 C)-98.6 F (37 C)] 97.6 F (36.4 C) (01/03 0615) Pulse Rate:  [72-77] 77 (01/03 1200) Weight:  [206 lb 5.6 oz (93.6 kg)] 206 lb 5.6 oz (93.6 kg) (01/03 0615) Last BM Date: 11/04/13 Mean arterial Pressure 74  Intake/Output:   Intake/Output Summary (Last 24 hours) at 11/07/13 1338 Last data filed at  11/07/13 0650  Gross per 24 hour  Intake    240 ml  Output   1225 ml  Net   -985 ml     Physical Exam: General: Awake and alert. Conversant. He doesn't look as tired as he has over the past several days.  HEENT: normal  Neck: supple. Carotids 2+ bilat; no bruits. No lymphadenopathy or thryomegaly appreciated.  Cor: Faint heart sounds , LVAD hum present.  Lungs: clear but decreased breath sounds at left base.  Abdomen: soft, nontender, nondistended. No hepatosplenomegaly. No bruits or masses. Active bowel sounds.  Driveline: C/D/I; securement device intact and driveline incorporated  Extremities: moderate peripheral edema up into thighs Neuro: alert & orientedx3, cranial nerves grossly intact. moves all 4 extremities w/o difficulty. Affect pleasant      Telemetry: sinus 70's  Labs: Basic Metabolic Panel:  Recent Labs Lab 11/02/13 0500 11/03/13 0455 11/04/13 0500 11/05/13 0455 11/06/13 0610 11/07/13 0537  NA 133* 134* 135* 136* 136* 136*  K 3.6 4.1 3.5* 3.7 4.0 3.9  CL 97 96 97 98 98 97  CO2 27 28 29 27 28 28   GLUCOSE 106* 100* 99 102* 93 89  BUN 15 14 14 15 14 14   CREATININE 1.27 1.21 1.28 1.28 1.15 1.24  CALCIUM 8.0* 8.2* 7.9* 8.0* 7.9* 8.0*  MG 1.9  --  2.1  --   --   --  Liver Function Tests:  Recent Labs Lab 11/01/13 0914 11/06/13 0610  AST 31 29  ALT 40 26  ALKPHOS 107 103  BILITOT 0.7 0.5  PROT 5.0* 5.2*  ALBUMIN 2.2* 2.1*   No results found for this basename: LIPASE, AMYLASE,  in the last 168 hours No results found for this basename: AMMONIA,  in the last 168 hours  CBC:  Recent Labs Lab 11/03/13 0455 11/04/13 0500 11/05/13 0455 11/06/13 0610 11/07/13 0537  WBC 11.3* 9.2 7.9 7.0 7.1  HGB 9.4* 8.5* 8.4* 8.8* 8.6*  HCT 29.0* 26.4* 26.2* 27.2* 26.2*  MCV 95.4 94.6 94.6 95.1 92.6  PLT 346 294 316 327 364    INR:  Recent Labs Lab 11/03/13 0455 11/04/13 0500 11/05/13 0455 11/06/13 0610 11/07/13 0537  INR 2.24* 2.91* 2.76* 2.27*  2.18*   LDH 382  Other results:  EKG:   Imaging: No results found.   Medications:     Scheduled Medications: . amiodarone  200 mg Oral BID  . aspirin EC  325 mg Oral Daily  . bisacodyl  10 mg Oral Daily   Or  . bisacodyl  10 mg Rectal Daily  . digoxin  0.125 mg Oral Daily  . docusate sodium  200 mg Oral Daily  . feeding supplement (ENSURE COMPLETE)  237 mL Oral BID BM  . ferrous gluconate  324 mg Oral BID WC  . guaiFENesin  600 mg Oral BID  . midodrine  5 mg Oral TID WC  . pantoprazole  40 mg Oral Daily  . potassium chloride  20 mEq Oral Daily  . ranolazine  500 mg Oral BID  . sodium chloride  3 mL Intravenous Q12H  . torsemide  20 mg Intravenous Daily  . torsemide  20 mg Oral BID  . warfarin  2 mg Oral ONCE-1800    Infusions: . sodium chloride 20 mL/hr at 10/22/13 0800  . sodium chloride 10 mL/hr (11/05/13 2058)  . sodium chloride 250 mL (10/20/13 0600)  . sodium chloride 20 mL/hr at 11/04/13 0815  . sodium chloride    . milrinone 0.25 mcg/kg/min (11/07/13 1126)    PRN Medications: acetaminophen, ondansetron (ZOFRAN) IV, sodium chloride, sodium chloride, sodium chloride, traMADol   Assessment:   1) Chronic systolic HF, Class IV, EF 20-25%  --s/p HM II VAD implant 12/15  2) NICM/ICM  3) Recurrent Vtach  - ablation 09/2011 and 06/2013  4) Atrial flutter  - s/p DC-CV 10/15/2013  5) CAD with recanalized occluded RCA with left to right collat.  6) PAD: s/p aorto-bifem, bilateral renal artery bypass, bilateral fem-pop, and left CEA.  7) Severe RV dysfunction by 2D echo 10/31/2013    Plan/Discussion:    He continues to look better on milrinone and in sinus rhythm. The near term goal is to remove fluid and to get back closer to his preop weight of 188.  We'll need to remove his temporary pacing wires tomorrow His permanent pacemaker will need to be re programmed to keep heart rate greater than 80  Hopefully after a few days of well-tolerated diuresis his  milrinone can be weaned off and plan for discharge made.  I reviewed the LVAD parameters from today, and compared the results to the patient's prior recorded data.  No programming changes were made.  The LVAD is functioning within specified parameters.  The patient performs LVAD self-test daily.  LVAD interrogation was negative for any significant power changes, alarms or PI events/speed drops.  LVAD equipment check completed  and is in good working order.  Back-up equipment present.   LVAD education done on emergency procedures and precautions and reviewed exit site care.  Length of Stay: [place] 11/07/2013, 1:38 PM  VAD Team Pager [phone] (7am - 7am)

## 2013-11-07 NOTE — Progress Notes (Signed)
Patient ambulated 250 ft; patient tolerated ok; patient needed 3 standing rest stops; patient back to recliner O2 97% on 2L; PI lowest 7.1; call bell within reach; will continue to monitor.  [name], Frank Dolly

## 2013-11-07 NOTE — Progress Notes (Signed)
CARDIAC REHAB PHASE I   PRE:  Rate/Rhythm: 75 SR    BP: sitting 80     SaO2: 94 2L  MODE:  Ambulation: 150 ft   POST:  Rate/Rhythm: 90 SR    BP: sitting 90     SaO2: 90 2L  Pt used rollator, 2L O2, assist x2 with gait belt to stand. Increased SOB today, 3/4. Needed x4 standing rest stops and then decreased distance. To recliner. SaO2 90 2L. PI lowest 6.3. Will continue to follow. [id number]  Josephina Shih Rover CES, ACSM 11/07/2013 12:36 PM

## 2013-11-07 NOTE — Progress Notes (Signed)
Patient ID: LEIB ASSELTA, male   DOB: 1941-09-18, 73 y.o.   MRN: [id number]  HeartMate 2 Rounding Note  Subjective:    [name] is a 73 yo man with an ischemic CM s/p ICD implant, chronic systolic HF (EF 0000000), paroxysmal VT s/p ablation (09/2011) and repeat (06/2013), CAD, PVD (s/p R fem pop BPG 01/2010; s/p L CFA BPG 05/2002), AAA (s/p repair 04/2002), CAS (s/p L CEA 2008), HTN, arthritis, and TIA. Recently admitted 12/8-12/11/14 for syncopal episode from inappropriate ATP which accelerated SVT>>VT with failed ATP and then spontaneous termination of VT. During hospital stay had repeat RHC, hemodynamics were stable and had TEE-DC-CV of AFL 10/15/13 which was successful. He was presented to our [facility] team and felt to be good LVAD candidate and then presented at [facility] transplant listing conference and they felt he was not a candidate for transplant, but good candidate for LVAD under DT criteria.   Echo 12/27: LV is small with EF 15%. RV not significantly dilated but severely HK.  12/30: Milrinone begun with decreased Coox and torsemide stopped with PI drops.  12/31: TEE-guided DCCV back to NSR.   He is maintaining NSR.  No longer dropping PI when standing. Exercise tolerance better but still with dyspnea.  Torsemide started yesterday, I/Os negative but not by much.  Co-ox staying low, I increased milrinone to 0.25 yesterday without much affect. Overall, he is feeling better compared to the beginning of the week.   LDH 398->354->382->363->381 Co-ox 64%->57%->51%->61%->55%->53%->56%  UA (12/26): ok BCx (12/26) : NGTD  LVAD INTERROGATION:  HeartMate II LVAD:  Flow 4.7  liters/min, speed 8400, power 4.5, PI 7.0.   Only 1 PI event since last interrogated.   Objective:    Vital Signs:   Temp:  [97.6 F (36.4 C)-98.6 F (37 C)] 97.6 F (36.4 C) (01/03 0615) Pulse Rate:  [70-74] 74 (01/03 0800) Weight:  [93.6 kg (206 lb 5.6 oz)] 93.6 kg (206 lb 5.6 oz) (01/03 0615) Last BM Date:  11/04/13  Intake/Output:   Intake/Output Summary (Last 24 hours) at 11/07/13 1051 Last data filed at 11/07/13 0650  Gross per 24 hour  Intake    480 ml  Output   1225 ml  Net   -745 ml     Physical Exam: General: Sitting in chair. Looks better this am HEENT: normal  Neck: supple. JVP 10. Carotids 2+ bilat; no bruits. No lymphadenopathy or thryomegaly appreciated.  Cor: Faint heart sounds , LVAD hum present.  Lungs: Decreased BS at left base.   Abdomen: soft, nontender, nondistended. No hepatosplenomegaly. No bruits or masses. Active bowel sounds.  Driveline: C/D/I; securement device intact and driveline incorporated  Extremities: warm. TED hose in place  2+ edema R>L lower leg to knees Neuro: alert & orientedx3, cranial nerves grossly intact. moves all 4 extremities w/o difficulty. Affect pleasant    Telemetry: a-paced, v-sensed 70  Labs: Basic Metabolic Panel:  Recent Labs Lab 11/02/13 0500 11/03/13 0455 11/04/13 0500 11/05/13 0455 11/06/13 0610 11/07/13 0537  NA 133* 134* 135* 136* 136* 136*  K 3.6 4.1 3.5* 3.7 4.0 3.9  CL 97 96 97 98 98 97  CO2 27 28 29 27 28 28   GLUCOSE 106* 100* 99 102* 93 89  BUN 15 14 14 15 14 14   CREATININE 1.27 1.21 1.28 1.28 1.15 1.24  CALCIUM 8.0* 8.2* 7.9* 8.0* 7.9* 8.0*  MG 1.9  --  2.1  --   --   --     Liver  Function Tests:  Recent Labs Lab 11/01/13 0914 11/06/13 0610  AST 31 29  ALT 40 26  ALKPHOS 107 103  BILITOT 0.7 0.5  PROT 5.0* 5.2*  ALBUMIN 2.2* 2.1*   No results found for this basename: LIPASE, AMYLASE,  in the last 168 hours No results found for this basename: AMMONIA,  in the last 168 hours  CBC:  Recent Labs Lab 11/03/13 0455 11/04/13 0500 11/05/13 0455 11/06/13 0610 11/07/13 0537  WBC 11.3* 9.2 7.9 7.0 7.1  HGB 9.4* 8.5* 8.4* 8.8* 8.6*  HCT 29.0* 26.4* 26.2* 27.2* 26.2*  MCV 95.4 94.6 94.6 95.1 92.6  PLT 346 294 316 327 364    INR:  Recent Labs Lab 11/03/13 0455 11/04/13 0500  11/05/13 0455 11/06/13 0610 11/07/13 0537  INR 2.24* 2.91* 2.76* 2.27* 2.18*   Other results:    Imaging: No results found.   Medications:     Scheduled Medications: . amiodarone  200 mg Oral BID  . aspirin EC  325 mg Oral Daily  . bisacodyl  10 mg Oral Daily   Or  . bisacodyl  10 mg Rectal Daily  . digoxin  0.125 mg Oral Daily  . docusate sodium  200 mg Oral Daily  . feeding supplement (ENSURE COMPLETE)  237 mL Oral BID BM  . ferrous gluconate  324 mg Oral BID WC  . guaiFENesin  600 mg Oral BID  . midodrine  5 mg Oral TID WC  . pantoprazole  40 mg Oral Daily  . potassium chloride  20 mEq Oral Daily  . potassium chloride  40 mEq Oral Once  . ranolazine  500 mg Oral BID  . sodium chloride  3 mL Intravenous Q12H  . torsemide  20 mg Intravenous Daily  . warfarin  2 mg Oral ONCE-1800    Infusions: . sodium chloride 20 mL/hr at 10/22/13 0800  . sodium chloride 10 mL/hr (11/05/13 2058)  . sodium chloride 250 mL (10/20/13 0600)  . sodium chloride 20 mL/hr at 11/04/13 0815  . sodium chloride    . milrinone 0.25 mcg/kg/min (11/06/13 1946)    PRN Medications: acetaminophen, ondansetron (ZOFRAN) IV, sodium chloride, sodium chloride, sodium chloride, traMADol   Assessment:   1) Chronic systolic HF, Class IV, EF 20-25%  --s/p HM II VAD implant 12/15  2) NICM/ICM  3) Recurrent Vtach  - ablation 09/2011 and 06/2013  4) Atrial flutter  - s/p DC-CV 10/15/2013  - Atrial fibrillation recurred post-op.  - TEE-guided DCCV 12/31 5) CAD with recanalized occluded RCA with left to right collat.  6) PAD: s/p aorto-bifem, bilateral renal artery bypass, bilateral fem-pop, and left CEA.  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     Plan/Discussion:    Patient seems overall improved in NSR.  PI is higher and no longer falling when he stands up.   He is tolerating milrinone @ 0.25 for RV support (increased yesterday) with MAP in the 70s. Co-ox is staying in the upper 50s though he feels  overall better and is walking more.  I think we can aim for ramp study on Monday to see if we can increase his pump speed and help his output.    I am going to increase torsemide to 20 mg po bid today (got 1 dose of 20 mg yesterday).  Aim for gentle diuresis to avoid overly lowering preload with RV dysfunction.    He is now on bid amiodarone and I started him on ranolazine 500 bid after discussion  with Dr. Caryl Comes to use as adjunct with amiodarone to try to maintain NSR.  QT ok on last ECG.     No VT.  Patient had been having atrial fibrillation with aberrancy when HR rises.   INR ok today. Given low VAD speed need to try to keep INR therapeutic.   Will continue midodrine for now.   Continue to work with Cardiac Rehab.  I reviewed the LVAD parameters from today, and compared the results to the patient's prior recorded data.  No programming changes were made.  The LVAD is functioning within specified parameters.  The patient performs LVAD self-test daily.  LVAD interrogation was negative for any significant power changes, alarms or PI events/speed drops.  LVAD equipment check completed and is in good working order.  Back-up equipment present.   LVAD education done on emergency procedures and precautions and reviewed exit site care.  Length of Stay: 20  Loralie Champagne MD 11/07/2013, 10:51 AM

## 2013-11-08 ENCOUNTER — Inpatient Hospital Stay (HOSPITAL_COMMUNITY): Payer: Medicare Other

## 2013-11-08 LAB — CBC
HCT: 27.4 % — ABNORMAL LOW (ref 39.0–52.0)
Hemoglobin: 9 g/dL — ABNORMAL LOW (ref 13.0–17.0)
MCH: 30.7 pg (ref 26.0–34.0)
MCHC: 32.8 g/dL (ref 30.0–36.0)
MCV: 93.5 fL (ref 78.0–100.0)
Platelets: 312 10*3/uL (ref 150–400)
RBC: 2.93 MIL/uL — ABNORMAL LOW (ref 4.22–5.81)
RDW: 17.4 % — ABNORMAL HIGH (ref 11.5–15.5)
WBC: 8 10*3/uL (ref 4.0–10.5)

## 2013-11-08 LAB — COMPREHENSIVE METABOLIC PANEL
ALK PHOS: 108 U/L (ref 39–117)
ALT: 27 U/L (ref 0–53)
AST: 36 U/L (ref 0–37)
Albumin: 2.2 g/dL — ABNORMAL LOW (ref 3.5–5.2)
BUN: 16 mg/dL (ref 6–23)
CHLORIDE: 97 meq/L (ref 96–112)
CO2: 30 mEq/L (ref 19–32)
Calcium: 8.1 mg/dL — ABNORMAL LOW (ref 8.4–10.5)
Creatinine, Ser: 1.37 mg/dL — ABNORMAL HIGH (ref 0.50–1.35)
GFR, EST AFRICAN AMERICAN: 57 mL/min — AB (ref >90)
GFR, EST NON AFRICAN AMERICAN: 49 mL/min — AB (ref >90)
GLUCOSE: 96 mg/dL (ref 70–99)
POTASSIUM: 3.9 meq/L (ref 3.7–5.3)
Sodium: 136 mEq/L — ABNORMAL LOW (ref 137–147)
Total Bilirubin: 0.5 mg/dL (ref 0.3–1.2)
Total Protein: 5.3 g/dL — ABNORMAL LOW (ref 6.0–8.3)

## 2013-11-08 LAB — CARBOXYHEMOGLOBIN
Carboxyhemoglobin: 2.1 % — ABNORMAL HIGH (ref 0.5–1.5)
Methemoglobin: 0.6 % (ref 0.0–1.5)
O2 Saturation: 59.8 %
Total hemoglobin: 7.9 g/dL — ABNORMAL LOW (ref 13.5–18.0)

## 2013-11-08 LAB — PROTIME-INR
INR: 2.21 — ABNORMAL HIGH (ref 0.00–1.49)
PROTHROMBIN TIME: 23.8 s — AB (ref 11.6–15.2)

## 2013-11-08 LAB — LACTATE DEHYDROGENASE: LDH: 417 U/L — ABNORMAL HIGH (ref 94–250)

## 2013-11-08 MED ORDER — TORSEMIDE 20 MG PO TABS
20.0000 mg | ORAL_TABLET | Freq: Every day | ORAL | Status: DC
  Administered 2013-11-09 – 2013-11-16 (×8): 20 mg via ORAL
  Filled 2013-11-08 (×8): qty 1

## 2013-11-08 MED ORDER — WARFARIN - PHYSICIAN DOSING INPATIENT
Freq: Every day | Status: DC
  Administered 2013-11-08: 18:00:00

## 2013-11-08 MED ORDER — WARFARIN SODIUM 2 MG PO TABS
2.0000 mg | ORAL_TABLET | Freq: Once | ORAL | Status: AC
  Administered 2013-11-08: 2 mg via ORAL
  Filled 2013-11-08: qty 1

## 2013-11-08 NOTE — Procedures (Signed)
Successful US guided left thoracentesis. Yielded 1.5L of amber/blood tinged pleural fluid. Pt tolerated procedure well. No immediate complications.  Specimen was sent for labs. CXR ordered.  Ascencion Dike PA-C 11/08/2013 12:03 PM

## 2013-11-08 NOTE — Progress Notes (Signed)
PW removed per MD orders; PW ends intact; patient on bedrest for 1 hour; vitals Q15; call bell within reach; will continue to monitor.  BARNETT, [name]

## 2013-11-08 NOTE — Progress Notes (Signed)
Patient is extremely tired; patient does not want to go walking; post thoracentesis MAP is 70 HR 70; reviewed history and saw PI events at 1346 of 3.9 and 4.2 followed by two more PI events at 1533 3.6 and 4.1; MD notified; no new orders given; will continue to monitor; patient is currently in chair with call bell in reach.  [name], Frank Dolly

## 2013-11-08 NOTE — Progress Notes (Signed)
MEDICATION RELATED CONSULT NOTE  - - - For Your Information  Pharmacy Consult for Torsemide IV injection Indication: Drug Shortages  Medications:  Scheduled:  . amiodarone  200 mg Oral BID  . aspirin EC  325 mg Oral Daily  . bisacodyl  10 mg Oral Daily   Or  . bisacodyl  10 mg Rectal Daily  . digoxin  0.125 mg Oral Daily  . docusate sodium  200 mg Oral Daily  . feeding supplement (ENSURE COMPLETE)  237 mL Oral BID BM  . ferrous gluconate  324 mg Oral BID WC  . guaiFENesin  600 mg Oral BID  . midodrine  5 mg Oral TID WC  . pantoprazole  40 mg Oral Daily  . potassium chloride  20 mEq Oral Daily  . ranolazine  500 mg Oral BID  . sodium chloride  3 mL Intravenous Q12H  . torsemide  20 mg Oral BID  . warfarin  2 mg Oral ONCE-1800  . Warfarin - Physician Dosing Inpatient   Does not apply q1800    Drug Shortages: Current Drugs [employer] and its partners work to keep the public informed of the most current drug shortages. Shortages can adversely affect drug therapy, compromise or delay medical procedures, and result in medication errors. - See more at: [email or web address]  Torsemide Injection [25 August 2013] Products Affected - Description Torsemide injection, [employer]  10 mg/mL, 2 mL vial (NDC [address]-29) 10 mg/mL, 5 mL vial [facility] [address]-93) Reason for the Shortage Roche discontinued Demadex injection for business reasons. Demadex tablets are not affected by this shortage.  [employer] has torsemide on shortage due to manufacturing delays.   - See more at: http://www.king-russell.[email or web address]?id=[email or web address]#[email or web address]  Plan:  1.  Patient has been changed to oral tablets, however should a loop diuretic be needed for IV administration, please consider Bumetanide or Furosemide as your alternatives.  Thanks, Rober Minion, PharmD., MS Clinical Pharmacist Pager:   [phone] Thank you for allowing pharmacy to be part of this patients care team.

## 2013-11-08 NOTE — Progress Notes (Signed)
Patient ID: [name], male   DOB: [birth year], 73 y.o.   MRN: [id number]  HeartMate 2 Rounding Note  Subjective:    [name] is a 73 yo man with an ischemic CM s/p ICD implant, chronic systolic HF (EF 06-26%), paroxysmal VT s/p ablation (09/2011) and repeat (06/2013), CAD, PVD (s/p R fem pop BPG 01/2010; s/p L CFA BPG 05/2002), AAA (s/p repair 04/2002), CAS (s/p L CEA 2008), HTN, arthritis, and TIA. Recently admitted 12/8-12/11/14 for syncopal episode from inappropriate ATP which accelerated SVT>>VT with failed ATP and then spontaneous termination of VT. During hospital stay had repeat RHC, hemodynamics were stable and had TEE-DC-CV of AFL 10/15/13 which was successful. He was presented to our [facility] team and felt to be good LVAD candidate and then presented at [facility] transplant listing conference and they felt he was not a candidate for transplant, but good candidate for LVAD under DT criteria.   POD 120 HeartMate II LVAD   Remains on Milrinone 0.25 CO-ox 55% He had a good day so far-- Appetite is good and he walked 229ft He diuresed well with bid demedex , maintaining NSR and on low dose Mil with increased pump speed to 8600 rpm  CXR this am showed inc L pleural effusion and US-guided L thoracentesis removed 1.5 liters- CXR post tap shows clear lungs  Temp pacing wires were removed today   LVAD INTERROGATION:  HeartMate II LVAD:  Flow 4.8 liters/min, speed 8600, power 4.8, PI 6.6.  4-5 PI events  Objective:    Vital Signs:   Temp:  [97.9 F (36.6 C)-99 F (37.2 C)] 98.9 F (37.2 C) (01/04 0400) Pulse Rate:  [71-84] 78 (01/04 1015) Resp:  [16-18] 18 (01/04 0400) BP: (100-115)/(0) 100/0 mmHg (01/04 1159) SpO2:  [91 %-100 %] 100 % (01/04 1159) Weight:  [202 lb 8 oz (91.853 kg)] 202 lb 8 oz (91.853 kg) (01/04 0400) Last BM Date: 11/07/13 Mean arterial Pressure 88  Intake/Output:   Intake/Output Summary (Last 24 hours) at 11/08/13 1236 Last data filed at 11/08/13 0644   Gross per 24 hour  Intake      0 ml  Output   3625 ml  Net  -3625 ml     Physical Exam: General: Awake and alert. Conversant. He doesn't look as tired as he has over the past several days.  HEENT: normal  Neck: supple. Carotids 2+ bilat; no bruits. No lymphadenopathy or thryomegaly appreciated.  Cor: Faint heart sounds , LVAD hum present.  Lungs: clear but decreased breath sounds at left base.  Abdomen: soft, nontender, nondistended. No hepatosplenomegaly. No bruits or masses. Active bowel sounds.  Driveline: C/D/I; securement device intact and driveline incorporated  Extremities: moderate pedal edema  Neuro: alert & orientedx3, cranial nerves grossly intact. moves all 4 extremities w/o difficulty. Affect pleasant      Telemetry: sinus 70's  Labs: Basic Metabolic Panel:  Recent Labs Lab 11/02/13 0500  11/04/13 0500 11/05/13 0455 11/06/13 0610 11/07/13 0537 11/08/13 0500  NA 133*  < > 135* 136* 136* 136* 136*  K 3.6  < > 3.5* 3.7 4.0 3.9 3.9  CL 97  < > 97 98 98 97 97  CO2 27  < > 29 27 28 28 30   GLUCOSE 106*  < > 99 102* 93 89 96  BUN 15  < > 14 15 14 14 16   CREATININE 1.27  < > 1.28 1.28 1.15 1.24 1.37*  CALCIUM 8.0*  < > 7.9* 8.0* 7.9* 8.0*  8.1*  MG 1.9  --  2.1  --   --   --   --   < > = values in this interval not displayed.  Liver Function Tests:  Recent Labs Lab 11/06/13 0610 11/08/13 0500  AST 29 36  ALT 26 27  ALKPHOS 103 108  BILITOT 0.5 0.5  PROT 5.2* 5.3*  ALBUMIN 2.1* 2.2*   No results found for this basename: LIPASE, AMYLASE,  in the last 168 hours No results found for this basename: AMMONIA,  in the last 168 hours  CBC:  Recent Labs Lab 11/04/13 0500 11/05/13 0455 11/06/13 0610 11/07/13 0537 11/08/13 0500  WBC 9.2 7.9 7.0 7.1 8.0  HGB 8.5* 8.4* 8.8* 8.6* 9.0*  HCT 26.4* 26.2* 27.2* 26.2* 27.4*  MCV 94.6 94.6 95.1 92.6 93.5  PLT 294 316 327 364 312    INR:  Recent Labs Lab 11/04/13 0500 11/05/13 0455 11/06/13 0610  11/07/13 0537 11/08/13 0500  INR 2.91* 2.76* 2.27* 2.18* 2.21*   LDH 382  Other results:  EKG:   Imaging: Dg Chest Port 1 View  11/08/2013   CLINICAL DATA:  Status post left ventricular assist device placement.  EXAM: PORTABLE CHEST - 1 VIEW  COMPARISON:  Chest x-ray 11/04/2013.  FINDINGS: An LVAD is again noted projecting over the lower thorax and upper abdomen. The inflow cannula appears oriented along the left ventricular long axis, with slight cephalad angulation. Lung volumes are normal. Persistent opacity at the base of the left hemithorax may reflect atelectasis and/or consolidation, with superimposed moderate left pleural effusion. Right lung appears clear. Pulmonary venous congestion, without frank pulmonary edema. Heart size remains enlarged. Atherosclerosis in the thoracic aorta. Left-sided pacemaker/AICD with lead tips projecting over the expected location of the right atrium and right ventricular apex. There is a right upper extremity PICC with tip terminating in the mid superior vena cava. Status post median sternotomy.  IMPRESSION: 1. Overall, allowing for slight differences in patient positioning, the radiographic appearance the chest is essentially unchanged, as detailed above.   Electronically Signed   By: Vinnie Langton M.D.   On: 11/08/2013 11:10     Medications:     Scheduled Medications: . amiodarone  200 mg Oral BID  . aspirin EC  325 mg Oral Daily  . bisacodyl  10 mg Oral Daily   Or  . bisacodyl  10 mg Rectal Daily  . digoxin  0.125 mg Oral Daily  . docusate sodium  200 mg Oral Daily  . feeding supplement (ENSURE COMPLETE)  237 mL Oral BID BM  . ferrous gluconate  324 mg Oral BID WC  . guaiFENesin  600 mg Oral BID  . midodrine  5 mg Oral TID WC  . pantoprazole  40 mg Oral Daily  . potassium chloride  20 mEq Oral Daily  . ranolazine  500 mg Oral BID  . sodium chloride  3 mL Intravenous Q12H  . [START ON 11/09/2013] torsemide  20 mg Oral Daily  . warfarin  2  mg Oral ONCE-1800  . Warfarin - Physician Dosing Inpatient   Does not apply q1800    Infusions: . sodium chloride 20 mL/hr at 10/22/13 0800  . sodium chloride 10 mL/hr (11/05/13 2058)  . sodium chloride 250 mL (10/20/13 0600)  . sodium chloride 10 mL/hr (11/07/13 2150)  . sodium chloride    . milrinone 0.25 mcg/kg/min (11/08/13 1230)    PRN Medications: acetaminophen, ondansetron (ZOFRAN) IV, sodium chloride, sodium chloride, sodium chloride, traMADol  Assessment:   1) Chronic systolic HF, Class IV, EF 20-25%  --s/p HM II VAD implant 12/15  2) NICM/ICM  3) Recurrent Vtach  - ablation 09/2011 and 06/2013  4) Atrial flutter  - s/p DC-CV 10/15/2013  5) CAD with recanalized occluded RCA with left to right collat.  6) PAD: s/p aorto-bifem, bilateral renal artery bypass, bilateral fem-pop, and left CEA.  7) Severe RV dysfunction by 2D echo 10/31/2013    Plan/Discussion:    He continues to look better on milrinone and in sinus rhythm. The near term goal is to remove fluid and to get back closer to his preop weight of 188.  His permanent pacemaker will need to be re programmed to keep heart rate greater than 80  Hopefully after a few days of well-tolerated diuresis and following successful L thoracentesis his milrinone can be weaned off and plan for discharge made.Will need ramp study prior to discharge  I reviewed the LVAD parameters from today, and compared the results to the patient's prior recorded data.  No programming changes were made.  The LVAD is functioning within specified parameters.  The patient performs LVAD self-test daily.  LVAD interrogation was negative for any significant power changes, alarms or PI events/speed drops.  LVAD equipment check completed and is in good working order.  Back-up equipment present.   LVAD education done on emergency procedures and precautions and reviewed exit site care.  Length of Stay: 21  VAN TRIGT III,PETER 11/08/2013, 12:36  PM  VAD Team Pager [phone] (7am - 7am)

## 2013-11-08 NOTE — Progress Notes (Signed)
Patient ID: [name], male   DOB: 1941-02-03, 73 y.o.   MRN: [id number]  HeartMate 2 Rounding Note  Subjective:    [name] is a 73 yo man with an ischemic CM s/p ICD implant, chronic systolic HF (EF 67-12%), paroxysmal VT s/p ablation (09/2011) and repeat (06/2013), CAD, PVD (s/p R fem pop BPG 01/2010; s/p L CFA BPG 05/2002), AAA (s/p repair 04/2002), CAS (s/p L CEA 2008), HTN, arthritis, and TIA. Recently admitted 12/8-12/11/14 for syncopal episode from inappropriate ATP which accelerated SVT>>VT with failed ATP and then spontaneous termination of VT. During hospital stay had repeat RHC, hemodynamics were stable and had TEE-DC-CV of AFL 10/15/13 which was successful. He was presented to our [facility] team and felt to be good LVAD candidate and then presented at [facility] transplant listing conference and they felt he was not a candidate for transplant, but good candidate for LVAD under DT criteria.   Echo 12/27: LV is small with EF 15%. RV not significantly dilated but severely HK.  12/30: Milrinone begun with decreased Coox and torsemide stopped with PI drops.  12/31: TEE-guided DCCV back to NSR.   He is maintaining NSR.  No longer dropping PI when standing.  Torsemide increased to 20 bid yesterday, he diuresed well and is breathing better.  Pump speed increased to 8600 yesterday and Coox is back up to 60% this morning.  However, in the setting of these changes, he had 6 PI events over the day yesterday (up from 1).   LDH 398->354->382->363->381->417 Co-ox 64%->57%->51%->61%->55%->53%->56%->60%  UA (12/26): ok BCx (12/26) : NGTD  LVAD INTERROGATION:  HeartMate II LVAD:  Flow 5.6  liters/min, speed 8600, power 5.1, PI 6.7.   6 PI events last 24 hrs.   Objective:    Vital Signs:   Temp:  [97.9 F (36.6 C)-99 F (37.2 C)] 98.9 F (37.2 C) (01/04 0400) Pulse Rate:  [71-84] 72 (01/04 0945) Resp:  [16-18] 18 (01/04 0400) SpO2:  [91 %-96 %] 93 % (01/04 0400) Weight:  [91.853 kg (202 lb 8  oz)] 91.853 kg (202 lb 8 oz) (01/04 0400) Last BM Date: 11/07/13  Intake/Output:   Intake/Output Summary (Last 24 hours) at 11/08/13 1010 Last data filed at 11/08/13 0644  Gross per 24 hour  Intake      0 ml  Output   3625 ml  Net  -3625 ml     Physical Exam: General: Sitting in chair. Looks better this am HEENT: normal  Neck: supple. JVP 10. Carotids 2+ bilat; no bruits. No lymphadenopathy or thryomegaly appreciated.  Cor: Faint heart sounds , LVAD hum present.  Lungs: Decreased BS at left base.   Abdomen: soft, nontender, nondistended. No hepatosplenomegaly. No bruits or masses. Active bowel sounds.  Driveline: C/D/I; securement device intact and driveline incorporated  Extremities: warm. TED hose in place  1+ edema R>L lower leg to knees Neuro: alert & orientedx3, cranial nerves grossly intact. moves all 4 extremities w/o difficulty. Affect pleasant    Telemetry: a-paced, v-sensed 70  Labs: Basic Metabolic Panel:  Recent Labs Lab 11/02/13 0500  11/04/13 0500 11/05/13 0455 11/06/13 0610 11/07/13 0537 11/08/13 0500  NA 133*  < > 135* 136* 136* 136* 136*  K 3.6  < > 3.5* 3.7 4.0 3.9 3.9  CL 97  < > 97 98 98 97 97  CO2 27  < > 29 27 28 28 30   GLUCOSE 106*  < > 99 102* 93 89 96  BUN 15  < >  14 15 14 14 16   CREATININE 1.27  < > 1.28 1.28 1.15 1.24 1.37*  CALCIUM 8.0*  < > 7.9* 8.0* 7.9* 8.0* 8.1*  MG 1.9  --  2.1  --   --   --   --   < > = values in this interval not displayed.  Liver Function Tests:  Recent Labs Lab 11/06/13 0610 11/08/13 0500  AST 29 36  ALT 26 27  ALKPHOS 103 108  BILITOT 0.5 0.5  PROT 5.2* 5.3*  ALBUMIN 2.1* 2.2*   No results found for this basename: LIPASE, AMYLASE,  in the last 168 hours No results found for this basename: AMMONIA,  in the last 168 hours  CBC:  Recent Labs Lab 11/04/13 0500 11/05/13 0455 11/06/13 0610 11/07/13 0537 11/08/13 0500  WBC 9.2 7.9 7.0 7.1 8.0  HGB 8.5* 8.4* 8.8* 8.6* 9.0*  HCT 26.4* 26.2* 27.2*  26.2* 27.4*  MCV 94.6 94.6 95.1 92.6 93.5  PLT 294 316 327 364 312    INR:  Recent Labs Lab 11/04/13 0500 11/05/13 0455 11/06/13 0610 11/07/13 0537 11/08/13 0500  INR 2.91* 2.76* 2.27* 2.18* 2.21*   Other results:    Imaging: No results found.   Medications:     Scheduled Medications: . amiodarone  200 mg Oral BID  . aspirin EC  325 mg Oral Daily  . bisacodyl  10 mg Oral Daily   Or  . bisacodyl  10 mg Rectal Daily  . digoxin  0.125 mg Oral Daily  . docusate sodium  200 mg Oral Daily  . feeding supplement (ENSURE COMPLETE)  237 mL Oral BID BM  . ferrous gluconate  324 mg Oral BID WC  . guaiFENesin  600 mg Oral BID  . midodrine  5 mg Oral TID WC  . pantoprazole  40 mg Oral Daily  . potassium chloride  20 mEq Oral Daily  . ranolazine  500 mg Oral BID  . sodium chloride  3 mL Intravenous Q12H  . torsemide  20 mg Oral BID  . warfarin  2 mg Oral ONCE-1800  . Warfarin - Physician Dosing Inpatient   Does not apply q1800    Infusions: . sodium chloride 20 mL/hr at 10/22/13 0800  . sodium chloride 10 mL/hr (11/05/13 2058)  . sodium chloride 250 mL (10/20/13 0600)  . sodium chloride 10 mL/hr (11/07/13 2150)  . sodium chloride    . milrinone 0.25 mcg/kg/min (11/07/13 2150)    PRN Medications: acetaminophen, ondansetron (ZOFRAN) IV, sodium chloride, sodium chloride, sodium chloride, traMADol   Assessment:   1) Chronic systolic HF, Class IV, EF 20-25%  --s/p HM II VAD implant 12/15  2) NICM/ICM  3) Recurrent Vtach  - ablation 09/2011 and 06/2013  4) Atrial flutter  - s/p DC-CV 10/15/2013  - Atrial fibrillation recurred post-op.  - TEE-guided DCCV 12/31 5) CAD with recanalized occluded RCA with left to right collat.  6) PAD: s/p aorto-bifem, bilateral renal artery bypass, bilateral fem-pop, and left CEA.  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  8) Pleural effusion    Plan/Discussion:    Patient remains in NSR.  HR in 70s, MAP 70s.  Pump speed increased  yesterday to 8600 and torsemide increased to bid.  He diuresed very well and breathing is better.  Weight is down 4 lbs.   However, he had 6 PI events over last day, up from 1 the prior day.   Coox back up to 60%.  - I am going  to decrease torsemide back to 20 mg daily today given more PI events.  He had a lot of fluid out yesterday, will try to diurese more gently.   - I think that he should have a ramp echo tomorrow to see if we can optimize his pump speed any further.  May be limited by PI issues, however.  - Discussed with Dr Prescott Gum increasing his baseline pacing rate to 80 via ICD.  He does not have BiV device so would be pacing RV.  Suspect that we could try this as LVAD makes BiV pacing less relevant and see if it seems effective.  Can have rep try reprogramming tomorrow.   He is now on bid amiodarone and I started him on ranolazine 500 bid after discussion with Dr. Caryl Comes to use as adjunct with amiodarone to try to maintain NSR.  QT ok on last ECG.     No VT.  Patient had been having atrial fibrillation with aberrancy when HR rises.   INR ok today. Given low VAD speed need to try to keep INR therapeutic.   Will continue midodrine for now.   Continue to work with Cardiac Rehab.  I reviewed the LVAD parameters from today, and compared the results to the patient's prior recorded data.  No programming changes were made.  The LVAD is functioning within specified parameters.  The patient performs LVAD self-test daily.  LVAD interrogation was negative for any significant power changes, alarms or PI events/speed drops.  LVAD equipment check completed and is in good working order.  Back-up equipment present.   LVAD education done on emergency procedures and precautions and reviewed exit site care.  Length of Stay: 21  Loralie Champagne MD 11/08/2013, 10:10 AM

## 2013-11-09 DIAGNOSIS — Z95818 Presence of other cardiac implants and grafts: Secondary | ICD-10-CM

## 2013-11-09 DIAGNOSIS — I5023 Acute on chronic systolic (congestive) heart failure: Secondary | ICD-10-CM

## 2013-11-09 LAB — CARBOXYHEMOGLOBIN
Carboxyhemoglobin: 2 % — ABNORMAL HIGH (ref 0.5–1.5)
Methemoglobin: 0.8 % (ref 0.0–1.5)
O2 SAT: 62.1 %
TOTAL HEMOGLOBIN: 9 g/dL — AB (ref 13.5–18.0)

## 2013-11-09 LAB — PROTIME-INR
INR: 2.22 — AB (ref 0.00–1.49)
Prothrombin Time: 23.9 seconds — ABNORMAL HIGH (ref 11.6–15.2)

## 2013-11-09 LAB — URINALYSIS, ROUTINE W REFLEX MICROSCOPIC
BILIRUBIN URINE: NEGATIVE
GLUCOSE, UA: NEGATIVE mg/dL
Ketones, ur: NEGATIVE mg/dL
Leukocytes, UA: NEGATIVE
Nitrite: NEGATIVE
PROTEIN: NEGATIVE mg/dL
Specific Gravity, Urine: 1.008 (ref 1.005–1.030)
UROBILINOGEN UA: 0.2 mg/dL (ref 0.0–1.0)
pH: 6 (ref 5.0–8.0)

## 2013-11-09 LAB — COMPREHENSIVE METABOLIC PANEL
ALT: 30 U/L (ref 0–53)
AST: 39 U/L — ABNORMAL HIGH (ref 0–37)
Albumin: 2.1 g/dL — ABNORMAL LOW (ref 3.5–5.2)
Alkaline Phosphatase: 103 U/L (ref 39–117)
BUN: 17 mg/dL (ref 6–23)
CALCIUM: 8 mg/dL — AB (ref 8.4–10.5)
CO2: 29 meq/L (ref 19–32)
CREATININE: 1.31 mg/dL (ref 0.50–1.35)
Chloride: 97 mEq/L (ref 96–112)
GFR, EST AFRICAN AMERICAN: 60 mL/min — AB (ref >90)
GFR, EST NON AFRICAN AMERICAN: 52 mL/min — AB (ref >90)
GLUCOSE: 90 mg/dL (ref 70–99)
Potassium: 3.9 mEq/L (ref 3.7–5.3)
SODIUM: 136 meq/L — AB (ref 137–147)
Total Bilirubin: 0.5 mg/dL (ref 0.3–1.2)
Total Protein: 5.2 g/dL — ABNORMAL LOW (ref 6.0–8.3)

## 2013-11-09 LAB — CBC
HCT: 27.4 % — ABNORMAL LOW (ref 39.0–52.0)
HEMOGLOBIN: 8.9 g/dL — AB (ref 13.0–17.0)
MCH: 30.6 pg (ref 26.0–34.0)
MCHC: 32.5 g/dL (ref 30.0–36.0)
MCV: 94.2 fL (ref 78.0–100.0)
Platelets: 290 10*3/uL (ref 150–400)
RBC: 2.91 MIL/uL — AB (ref 4.22–5.81)
RDW: 17.6 % — ABNORMAL HIGH (ref 11.5–15.5)
WBC: 7.2 10*3/uL (ref 4.0–10.5)

## 2013-11-09 LAB — PRO B NATRIURETIC PEPTIDE: Pro B Natriuretic peptide (BNP): 3387 pg/mL — ABNORMAL HIGH (ref 0–125)

## 2013-11-09 LAB — URINE MICROSCOPIC-ADD ON

## 2013-11-09 LAB — LACTATE DEHYDROGENASE: LDH: 410 U/L — ABNORMAL HIGH (ref 94–250)

## 2013-11-09 LAB — DIGOXIN LEVEL: DIGOXIN LVL: 1.3 ng/mL (ref 0.8–2.0)

## 2013-11-09 MED ORDER — WARFARIN - PHARMACIST DOSING INPATIENT
Freq: Every day | Status: DC
  Administered 2013-11-12 – 2013-11-17 (×4)

## 2013-11-09 MED ORDER — DIGOXIN 0.0625 MG HALF TABLET
0.0625 mg | ORAL_TABLET | Freq: Every day | ORAL | Status: DC
  Administered 2013-11-10 – 2013-11-18 (×8): 0.0625 mg via ORAL
  Filled 2013-11-09 (×9): qty 1

## 2013-11-09 MED ORDER — WARFARIN SODIUM 2 MG PO TABS
2.0000 mg | ORAL_TABLET | Freq: Every day | ORAL | Status: DC
  Administered 2013-11-09 – 2013-11-17 (×9): 2 mg via ORAL
  Filled 2013-11-09 (×11): qty 1

## 2013-11-09 NOTE — Progress Notes (Signed)
11/09/2013 6:38 PM Nursing note Pt. Girlfriend, [name], successfully performed drive-line dressing change this evening. Sterile technique re-enforced and questions regarding dressing changes addressed. Pt. Tolerated well. Will continue to monitor patient.  Jay Haskew, [place]

## 2013-11-09 NOTE — Progress Notes (Signed)
CSW met with patient at bedside. Patient reports "I'm tired" but states "not sure why". Patient states that he ambulated the longest distance yet today and was very proud of his accomplishment. Patient denies any concerns and appears to be coping well under circumstances. Patient reports continued support from his caregiver [name] and reports she has been independent with his dressing changes and overall care. CSW continues to provide support and encouragement throughout recovery. [name], [place]

## 2013-11-09 NOTE — Progress Notes (Signed)
CARDIAC REHAB PHASE I   PRE:  Rate/Rhythm: 72 SR    BP: sitting 70, 90 second attempt    SaO2: 97 2L  MODE:  Ambulation: 300 ft   POST:  Rate/Rhythm: 88 SR    BP: sitting 118 , 8015 min later    SaO2: 94 2L  Pt feeling much better today after diuresis and thoracentisis. Less SOB, less knee pain and able to walk longest distance yet. Short standing rests x4. SaO2 better after walk however BP elevated.  Took MAP 15 min later and 80. Pt feels well, no c/o. Will f/u. No PI event. [place], [place], ACSM 11/09/2013 8:27 AM

## 2013-11-09 NOTE — Progress Notes (Signed)
ANTICOAGULATION CONSULT NOTE - Follow Up Consult  Pharmacy Consult for Coumadin Indication: atrial fibrillation / s/p LVAD 12/15  Allergies  Allergen Reactions  . Demerol [Meperidine]     Paralysis. Could only move eyes.  . Meperidine Hcl Other (See Comments)    "CAN'T MOVE" CATATONIC PER PT.  [name] Opium Other (See Comments)    "CAN'T MOVE" CATATONIC PER PT.    Patient Measurements: Height: 6' (182.9 cm) Weight: 200 lb 2.8 oz (90.8 kg) (b) IBW/kg (Calculated) : 77.6  Vital Signs: Temp: 97.9 F (36.6 C) (01/05 0535) Temp src: Oral (01/05 0535) Pulse Rate: 73 (01/05 0535)  Labs:  Recent Labs  11/07/13 0537 11/08/13 0500 11/09/13 0500  HGB 8.6* 9.0* 8.9*  HCT 26.2* 27.4* 27.4*  PLT 364 312 290  LABPROT 23.6* 23.8* 23.9*  INR 2.18* 2.21* 2.22*  CREATININE 1.24 1.37* 1.31    Estimated Creatinine Clearance: 54.3 ml/min (by C-G formula based on Cr of 1.31).   Medical History: Past Medical History  Diagnosis Date  . CAD (coronary artery disease)     s/p MI 1982;  s/p DES to CFX 2011;  s/p NSTEMI 4/12 in setting of VTach;  cath 7/12:  LM ok, LAD mid 30-40%, Dx's with 40%, mCFX stent patent, prox to mid RCA occluded with R to R and L to R collats.  Medical Tx was continued  . Bradycardia   . Ventricular tachycardia     s/p AICD;   h/o ICD shock;  Amiodarone Rx. S/P ablation in 2012  . 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 HTN (hypertension)   . HLD (hyperlipidemia)   . Cytopenia   . Hypothyroidism   . Renal insufficiency   . Claudication   . Chronic systolic heart failure   . Ischemic cardiomyopathy     echo 7/12:  EF 25-30%, mild AI, mod MR, mod LAE  . TIA (transient ischemic attack) 2001  . CVD (cerebrovascular disease)     left CEA 2008  . Stroke   . Anemia   . Inappropriate therapy from implantable cardioverter-defibrillator  04/02/2013    ATP for sinus tach  SVT wavelet identified SVT but was no  passive   . Myocardial infarction 1992  . Anginal pain   . Automatic implantable cardioverter-defibrillator in situ     [employer]onic Evera device serial number [id number]    . Sleep apnea     denies wearing mask (05/25/2013)  . Arthritis     "a little in my knees" (05/25/2013)  . Melanoma of ear 2013    "near left ear" (05/25/2013)    Assessment: 73 yo M with HF s/p LVAD placement 12/15.  Afib > SR 70s s/p DCCV 12/31. Current tx amiodarone, ranolazine, digoxin.   INR 2.2 today, cbc low stable watch, no bleeding noted, on iron supplement bid.  Goal of Therapy:  INR 2-3 Monitor platelets by anticoagulation protocol: Yes   Plan:  Coumadin 2mg  daily Daily INR  Bonnita Nasuti Pharm.D. CPP, BCPS Clinical Pharmacist [phone] 11/09/2013 11:56 AM

## 2013-11-09 NOTE — Progress Notes (Addendum)
Physical Therapy Treatment Patient Details Name: [name] MRN: [id number] DOB: 1940/11/28 Today's Date: 11/09/2013 Time: 1157-2620 PT Time Calculation (min): 39 min  PT Assessment / Plan / Recommendation  History of Present Illness Pt admit for LVAD placement. Has been symptomatic with drops in PI (afib with aberrancy; not vtach) and treated for Rt BPPV successfully.   PT Comments   Pt limited with ambulation this pm due to dropping PI and "not feeling right." (pt later reported he felt SOB, not dizzy) when requested to stop walking. PI initially 7.1 with drop to 2.7 upon standing and not recovering within 60 seconds so returned to sitting. Quickly recovered to 6.1, on standing again decr to 3.0 and recovered to 3.2 within 45 seconds. Pt asymptomatic and ambulated 150 ft with PI 4.0-4.1 throughout. Called and spoke with LVAD coordinator on-call Cloyde Reams) and she stated she would forward information to [place] (who saw pt this am).   Follow Up Recommendations  Home health PT;Supervision/Assistance - 24 hour     Does the patient have the potential to tolerate intense rehabilitation     Barriers to Discharge        Equipment Recommendations  Other (comment) (pt considering rollator to manage equipment and provide seat)    Recommendations for Other Services    Frequency Min 5X/week   Progress towards PT Goals Progress towards PT goals:  (Goals partially met; updated)  Plan Current plan remains appropriate    Precautions / Restrictions Precautions Precautions: Sternal;Fall Precaution Comments: LVAD; closely monitor PI as dropping when changing positions Required Braces or Orthoses: Other Brace/Splint Other Brace/Splint: strap for controller; vest for batteries; needs to take bag with back up supplies with him whenever he leaves his room Restrictions Weight Bearing Restrictions: Yes Other Position/Activity Restrictions: sternal precautions   Pertinent Vitals/Pain  See above re: PI  numbers; HR 80s throughout; SaO2 92% on 2L while walking    Mobility  Transfers Transfers: Sit to Stand;Stand to Sit Sit to Stand: 3: Mod assist;From chair/3-in-1 Stand to Sit: 4: Min guard Details for Transfer Assistance: stood x 2 from recliner; on first stand, PI dropped 7.1 to 2.7 and did not begin to increase within 60 seconds--pt asymptomatic, but returned to sitting with return to PI 6.1 within 30 seconds; on second stand, PI dropped to 3.0 and after 60 seconds was 3.2--Dr. Prescott Gum entered room and OK'd walking as long as asymptomatic; vc to not use UEs on armrests, instructed in hands on knees); x1 to/from rollator with min assist vc for sequencing  Ambulation/Gait Ambulation/Gait Assistance: 4: Min assist Ambulation Distance (Feet): 150 Feet Assistive device: 4-wheeled walker Ambulation/Gait Assistance Details: multiple standing breaks to assess PI number; pt began feeling "funny" and asked to terminate walk after 150 ft; later stated he was SOB Gait Pattern: Step-through pattern;Decreased stride length;Trunk flexed    Exercises Other Exercises Other Exercises: Patient able to ask for all equipment needed to switch to batteries.  Able to make switch independently.   Asked for bag to take with [place] for ambulation.  Patient able to list supplies needed in bag.  Patient able to switch off of batteries at end of session. Other Exercises: Pt able to state need to closely monitor PI due to change in pump speed since his morning walk. Pt independently monitoring PI via controller while ambulating   PT Diagnosis:    PT Problem List:   PT Treatment Interventions:     PT Goals (current goals can now be found  in the care plan section)    Visit Information  Last PT Received On: 11/09/13 Assistance Needed: +1 History of Present Illness: Pt admit for LVAD placement. Has been symptomatic with drops in PI (afib with aberrancy; not vtach) and treated for Rt BPPV successfully.    Subjective  Data      Cognition  Cognition Arousal/Alertness: Awake/alert Behavior During Therapy: WFL for tasks assessed/performed Overall Cognitive Status: Within Functional Limits for tasks assessed    Balance     End of Session PT - End of Session Equipment Utilized During Treatment: Oxygen Activity Tolerance: Patient limited by fatigue Patient left: in chair;with call bell/phone within reach Nurse Communication: Mobility status;Other (comment) (called LVAD coordinator re: PI #'s )   GP     [name] 11/09/2013, 4:42 PM Pager [phone]

## 2013-11-09 NOTE — Progress Notes (Signed)
The Chaplain offered emotional and spiritual support to the patient today. The patient was very upbeat in spirit in our visit together and remained optimistic that he will be released from the hospital very soon. The patient talked a little about his family and how he appreciates them for coming to see him during his stay at the hospital. The patient remained distant and closed off when it came to talking about spiritual things with the Chaplain and he declined to have prayer with the [place]. The patient however did thank the Chaplain for stopping by for an initial visit.  Chaplain Clista Bernhardt Coren Crownover

## 2013-11-09 NOTE — Progress Notes (Signed)
Patient ID: [name], male   DOB: 31-Aug-1941, 73 y.o.   MRN: [id number]  HeartMate 2 Rounding Note  Subjective:    [name] is a 73 yo man with an ischemic CM s/p ICD implant, chronic systolic HF (EF 25-42%), paroxysmal VT s/p ablation (09/2011) and repeat (06/2013), CAD, PVD (s/p R fem pop BPG 01/2010; s/p L CFA BPG 05/2002), AAA (s/p repair 04/2002), CAS (s/p L CEA 2008), HTN, arthritis, and TIA. Recently admitted 12/8-12/11/14 for syncopal episode from inappropriate ATP which accelerated SVT>>VT with failed ATP and then spontaneous termination of VT. During hospital stay had repeat RHC, hemodynamics were stable and had TEE-DC-CV of AFL 10/15/13 which was successful. He was presented to our [facility] team and felt to be good LVAD candidate and then presented at [facility] transplant listing conference and they felt he was not a candidate for transplant, but good candidate for LVAD under DT criteria.   POD 21 HeartMate II LVAD   On Milrinone 0.125  Co-ox  62.1  Diuresed well the past few days and weight is down to 200. Baseline 188.  Had left thoracentesis yesterday and 1.5 L removed. The CXR looks much better with resolution of the left pleural effusion. He feels much better.  Ambulated 300 ft today without difficulty. Denies dyspnea.  Speed increased to 8800 by cardiology.   LVAD INTERROGATION:  HeartMate II LVAD:  Flow 5.5 liters/min, speed 8800, power 5.5, PI 7.    Objective:    Vital Signs:   Temp:  [97.9 F (36.6 C)-98.9 F (37.2 C)] 97.9 F (36.6 C) (01/05 0535) Pulse Rate:  [71-73] 72 (01/05 1300) Resp:  [16-18] 18 (01/05 0535) SpO2:  [96 %-97 %] 97 % (01/05 1300) Weight:  [90.8 kg (200 lb 2.8 oz)] 90.8 kg (200 lb 2.8 oz) (01/05 0535) Last BM Date: 11/07/13 Mean arterial Pressure 90  Intake/Output:   Intake/Output Summary (Last 24 hours) at 11/09/13 1509 Last data filed at 11/09/13 1400  Gross per 24 hour  Intake  615.2 ml  Output   1700 ml  Net -1084.8 ml      Physical Exam:  General: Awake and alert. Conversant.  HEENT: normal  Neck: supple. Carotids 2+ bilat; no bruits. No lymphadenopathy or thryomegaly appreciated.  Cor: Faint heart sounds , LVAD hum present.  Lungs: clear but decreased breath sounds at left base.  Abdomen: soft, nontender, nondistended. No hepatosplenomegaly. No bruits or masses. Active bowel sounds.  Driveline: C/D/I; securement device intact and driveline incorporated  Extremities: moderate peripheral edema in feet and lower legs Neuro: alert & orientedx3, cranial nerves grossly intact. moves all 4 extremities w/o difficulty. Affect pleasant      Telemetry: AV paced 70  Labs: Basic Metabolic Panel:  Recent Labs Lab 11/04/13 0500 11/05/13 0455 11/06/13 0610 11/07/13 0537 11/08/13 0500 11/09/13 0500  NA 135* 136* 136* 136* 136* 136*  K 3.5* 3.7 4.0 3.9 3.9 3.9  CL 97 98 98 97 97 97  CO2 29 27 28 28 30 29   GLUCOSE 99 102* 93 89 96 90  BUN 14 15 14 14 16 17   CREATININE 1.28 1.28 1.15 1.24 1.37* 1.31  CALCIUM 7.9* 8.0* 7.9* 8.0* 8.1* 8.0*  MG 2.1  --   --   --   --   --     Liver Function Tests:  Recent Labs Lab 11/06/13 0610 11/08/13 0500 11/09/13 0500  AST 29 36 39*  ALT 26 27 30   ALKPHOS 103 108 103  BILITOT  0.5 0.5 0.5  PROT 5.2* 5.3* 5.2*  ALBUMIN 2.1* 2.2* 2.1*   No results found for this basename: LIPASE, AMYLASE,  in the last 168 hours No results found for this basename: AMMONIA,  in the last 168 hours  CBC:  Recent Labs Lab 11/05/13 0455 11/06/13 0610 11/07/13 0537 11/08/13 0500 11/09/13 0500  WBC 7.9 7.0 7.1 8.0 7.2  HGB 8.4* 8.8* 8.6* 9.0* 8.9*  HCT 26.2* 27.2* 26.2* 27.4* 27.4*  MCV 94.6 95.1 92.6 93.5 94.2  PLT 316 327 364 312 290    INR:  Recent Labs Lab 11/05/13 0455 11/06/13 0610 11/07/13 0537 11/08/13 0500 11/09/13 0500  INR 2.76* 2.27* 2.18* 2.21* 2.22*   LDH: 410  BNP 3387 Other results:  EKG:   Imaging: Dg Chest 1 View  11/08/2013    CLINICAL DATA:  Left thoracentesis.  EXAM: CHEST - 1 VIEW  COMPARISON:  11/08/2013 at 80 04 a.m.  FINDINGS: Significantly reduced size of left pleural effusion. No significant pneumothorax observed. The very tip of the left hemithorax was excluded from imaging but even if there was a pneumothorax above this a would only be in the 1-2% range, and accordingly does not really warrant reimaging.  Left ventricular assist device noted. AICD in place. Stably enlarged cardiopericardial silhouette. Indistinct vasculature suggesting pulmonary venous hypertension. Right central line tip: SVC.  IMPRESSION: 1. Reduced size of left pleural effusion. No pneumothorax or complicating feature observed. 2. Cardiomegaly with pulmonary venous hypertension.   Electronically Signed   By: Sherryl Barters M.D.   On: 11/08/2013 12:46   Dg Chest Port 1 View  11/08/2013   CLINICAL DATA:  Status post left ventricular assist device placement.  EXAM: PORTABLE CHEST - 1 VIEW  COMPARISON:  Chest x-ray 11/04/2013.  FINDINGS: An LVAD is again noted projecting over the lower thorax and upper abdomen. The inflow cannula appears oriented along the left ventricular long axis, with slight cephalad angulation. Lung volumes are normal. Persistent opacity at the base of the left hemithorax may reflect atelectasis and/or consolidation, with superimposed moderate left pleural effusion. Right lung appears clear. Pulmonary venous congestion, without frank pulmonary edema. Heart size remains enlarged. Atherosclerosis in the thoracic aorta. Left-sided pacemaker/AICD with lead tips projecting over the expected location of the right atrium and right ventricular apex. There is a right upper extremity PICC with tip terminating in the mid superior vena cava. Status post median sternotomy.  IMPRESSION: 1. Overall, allowing for slight differences in patient positioning, the radiographic appearance the chest is essentially unchanged, as detailed above.   Electronically  Signed   By: Vinnie Langton M.D.   On: 11/08/2013 11:10   US Thoracentesis Asp Pleural Space W/img Guide  11/08/2013   CLINICAL DATA:  Post LVAD procedure, shortness of breath. Left-sided pleural effusion. Request diagnostic and therapeutic thoracentesis  EXAM: ULTRASOUND GUIDED left THORACENTESIS  COMPARISON:  None.  FINDINGS: A total of approximately 1.5 L of amber/ blood-tinged fluid was removed. A fluid sample wassent for laboratory analysis.  IMPRESSION: Successful ultrasound guided left thoracentesis yielding 1.5 L of pleural fluid.  Read by: Ascencion Dike PA-C  PROCEDURE: An ultrasound guided thoracentesis was thoroughly discussed with the patient and questions answered. The benefits, risks, alternatives and complications were also discussed. The patient understands and wishes to proceed with the procedure. Written consent was obtained.  Ultrasound was performed to localize and mark an adequate pocket of fluid in the left chest. The area was then prepped and draped in the normal sterile  fashion. 1% Lidocaine was used for local anesthesia. Under ultrasound guidance a 19 gauge Yueh catheter was introduced. Thoracentesis was performed. The catheter was removed and a dressing applied.  Complications:  None immediate   Electronically Signed   By: Jacqulynn Cadet M.D.   On: 11/08/2013 12:07      Medications:     Scheduled Medications: . amiodarone  200 mg Oral BID  . aspirin EC  325 mg Oral Daily  . bisacodyl  10 mg Oral Daily   Or  . bisacodyl  10 mg Rectal Daily  . [START ON 11/10/2013] digoxin  0.0625 mg Oral Daily  . docusate sodium  200 mg Oral Daily  . feeding supplement (ENSURE COMPLETE)  237 mL Oral BID BM  . ferrous gluconate  324 mg Oral BID WC  . guaiFENesin  600 mg Oral BID  . midodrine  5 mg Oral TID WC  . pantoprazole  40 mg Oral Daily  . potassium chloride  20 mEq Oral Daily  . ranolazine  500 mg Oral BID  . sodium chloride  3 mL Intravenous Q12H  . torsemide  20 mg Oral  Daily  . warfarin  2 mg Oral q1800  . Warfarin - Pharmacist Dosing Inpatient   Does not apply q1800     Infusions: . sodium chloride 20 mL/hr at 10/22/13 0800  . sodium chloride 10 mL/hr (11/05/13 2058)  . sodium chloride 250 mL (10/20/13 0600)  . sodium chloride 10 mL/hr (11/07/13 2150)  . sodium chloride    . milrinone 0.25 mcg/kg/min (11/09/13 0312)     PRN Medications:  acetaminophen, ondansetron (ZOFRAN) IV, sodium chloride, sodium chloride, sodium chloride, traMADol   Assessment:   1) Chronic systolic HF, Class IV, EF 20-25%  --s/p HM II VAD implant 12/15  2) NICM/ICM  3) Recurrent Vtach  - ablation 09/2011 and 06/2013  4) Atrial flutter  - s/p DC-CV 10/15/2013  5) CAD with recanalized occluded RCA with left to right collat.  6) PAD: s/p aorto-bifem, bilateral renal artery bypass, bilateral fem-pop, and left CEA.  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 Draining the left pleural effusion really seems to have helped his breathing.  Rhythm is sinus with periods of AV pacing at 70. He may benefit from turning lower pacing threshold up a little higher with RV dysfunction.  INR therapeutic.  Healing up well from surgery.  I reviewed the LVAD parameters from today, and compared the results to the patient's prior recorded data.  No programming changes were made.  The LVAD is functioning within specified parameters.  The patient performs LVAD self-test daily.  LVAD interrogation was negative for any significant power changes, alarms or PI events/speed drops.  LVAD equipment check completed and is in good working order.  Back-up equipment present.   LVAD education done on emergency procedures and precautions and reviewed exit site care.  Length of Stay: 22  Gaye Pollack 11/09/2013, 3:09 PM  VAD Team Pager [phone] (7am - 7am)

## 2013-11-09 NOTE — Progress Notes (Signed)
Patient ID: VIOLET CART, male   DOB: 05/22/1941, 73 y.o.   MRN: [id number]  HeartMate 2 Rounding Note  Subjective:    [name] is a 73 yo man with an ischemic CM s/p ICD implant, chronic systolic HF (EF 40-10%), paroxysmal VT s/p ablation (09/2011) and repeat (06/2013), CAD, PVD (s/p R fem pop BPG 01/2010; s/p L CFA BPG 05/2002), AAA (s/p repair 04/2002), CAS (s/p L CEA 2008), HTN, arthritis, and TIA. Recently admitted 12/8-12/11/14 for syncopal episode from inappropriate ATP which accelerated SVT>>VT with failed ATP and then spontaneous termination of VT. During hospital stay had repeat RHC, hemodynamics were stable and had TEE-DC-CV of AFL 10/15/13 which was successful. He was presented to our [facility] team and felt to be good LVAD candidate and then presented at [facility] transplant listing conference and they felt he was not a candidate for transplant, but good candidate for LVAD under DT criteria.   Echo 12/27: LV is small with EF 15%. RV not significantly dilated but severely HK.  12/30: Milrinone begun with decreased Coox  12/31: TEE-guided DCCV back to NSR.   Yesterday had thoracentesis with net negative 1.5 liters. Remains in NSR. Feeling much better this am and able to walk 300 ft. Torsemide cut back to 20 mg daily d/t increase in PI events. Co-ox stable 62%. Cr stable 1.3. Dark colored urine. Denies SOB, orthopnea or CP.   LDH 381->417>410 Co-ox 55%->53%->56%->60%>62%  BCx (12/26) : NGTD  LVAD INTERROGATION:  HeartMate II LVAD:  Flow 5.5  liters/min, speed 8600, power 5.1,  PI 6.6.      Objective:    Vital Signs:   Temp:  [97.9 F (36.6 C)-98.9 F (37.2 C)] 97.9 F (36.6 C) (01/05 0535) Pulse Rate:  [71-84] 73 (01/05 0535) Resp:  [16-18] 18 (01/05 0535) BP: (100-115)/(0) 100/0 mmHg (01/04 1159) SpO2:  [96 %-100 %] 97 % (01/05 0535) Weight:  [200 lb 2.8 oz (90.8 kg)] 200 lb 2.8 oz (90.8 kg) (01/05 0535) Last BM Date: 11/07/13  Intake/Output:   Intake/Output Summary (Last  24 hours) at 11/09/13 1010 Last data filed at 11/08/13 2300  Gross per 24 hour  Intake 274.91 ml  Output   1250 ml  Net -975.09 ml     Physical Exam: General: Sitting in chair. NAD HEENT: normal  Neck: supple. JVP 7 Carotids 2+ bilat; no bruits. No lymphadenopathy or thryomegaly appreciated.  Cor: Faint heart sounds , LVAD hum present.  Lungs: CTA Abdomen: soft, nontender, nondistended. No hepatosplenomegaly. No bruits or masses. Active bowel sounds.  Driveline: C/D/I; securement device intact and driveline incorporated  Extremities: warm. TED hose in place; trace edema' RUE PICC 2L Neuro: alert & orientedx3, cranial nerves grossly intact. moves all 4 extremities w/o difficulty. Affect pleasant    Telemetry: a-paced, 70  Labs: Basic Metabolic Panel:  Recent Labs Lab 11/04/13 0500 11/05/13 0455 11/06/13 0610 11/07/13 0537 11/08/13 0500 11/09/13 0500  NA 135* 136* 136* 136* 136* 136*  K 3.5* 3.7 4.0 3.9 3.9 3.9  CL 97 98 98 97 97 97  CO2 29 27 28 28 30 29   GLUCOSE 99 102* 93 89 96 90  BUN 14 15 14 14 16 17   CREATININE 1.28 1.28 1.15 1.24 1.37* 1.31  CALCIUM 7.9* 8.0* 7.9* 8.0* 8.1* 8.0*  MG 2.1  --   --   --   --   --     Liver Function Tests:  Recent Labs Lab 11/06/13 0610 11/08/13 0500 11/09/13 0500  AST  29 36 39*  ALT 26 27 30   ALKPHOS 103 108 103  BILITOT 0.5 0.5 0.5  PROT 5.2* 5.3* 5.2*  ALBUMIN 2.1* 2.2* 2.1*   No results found for this basename: LIPASE, AMYLASE,  in the last 168 hours No results found for this basename: AMMONIA,  in the last 168 hours  CBC:  Recent Labs Lab 11/05/13 0455 11/06/13 0610 11/07/13 0537 11/08/13 0500 11/09/13 0500  WBC 7.9 7.0 7.1 8.0 7.2  HGB 8.4* 8.8* 8.6* 9.0* 8.9*  HCT 26.2* 27.2* 26.2* 27.4* 27.4*  MCV 94.6 95.1 92.6 93.5 94.2  PLT 316 327 364 312 290    INR:  Recent Labs Lab 11/05/13 0455 11/06/13 0610 11/07/13 0537 11/08/13 0500 11/09/13 0500  INR 2.76* 2.27* 2.18* 2.21* 2.22*   Other  results:    Imaging: Dg Chest 1 View  11/08/2013   CLINICAL DATA:  Left thoracentesis.  EXAM: CHEST - 1 VIEW  COMPARISON:  11/08/2013 at 80 04 a.m.  FINDINGS: Significantly reduced size of left pleural effusion. No significant pneumothorax observed. The very tip of the left hemithorax was excluded from imaging but even if there was a pneumothorax above this a would only be in the 1-2% range, and accordingly does not really warrant reimaging.  Left ventricular assist device noted. AICD in place. Stably enlarged cardiopericardial silhouette. Indistinct vasculature suggesting pulmonary venous hypertension. Right central line tip: SVC.  IMPRESSION: 1. Reduced size of left pleural effusion. No pneumothorax or complicating feature observed. 2. Cardiomegaly with pulmonary venous hypertension.   Electronically Signed   By: Sherryl Barters M.D.   On: 11/08/2013 12:46   Dg Chest Port 1 View  11/08/2013   CLINICAL DATA:  Status post left ventricular assist device placement.  EXAM: PORTABLE CHEST - 1 VIEW  COMPARISON:  Chest x-ray 11/04/2013.  FINDINGS: An LVAD is again noted projecting over the lower thorax and upper abdomen. The inflow cannula appears oriented along the left ventricular long axis, with slight cephalad angulation. Lung volumes are normal. Persistent opacity at the base of the left hemithorax may reflect atelectasis and/or consolidation, with superimposed moderate left pleural effusion. Right lung appears clear. Pulmonary venous congestion, without frank pulmonary edema. Heart size remains enlarged. Atherosclerosis in the thoracic aorta. Left-sided pacemaker/AICD with lead tips projecting over the expected location of the right atrium and right ventricular apex. There is a right upper extremity PICC with tip terminating in the mid superior vena cava. Status post median sternotomy.  IMPRESSION: 1. Overall, allowing for slight differences in patient positioning, the radiographic appearance the chest is  essentially unchanged, as detailed above.   Electronically Signed   By: Vinnie Langton M.D.   On: 11/08/2013 11:10   US Thoracentesis Asp Pleural Space W/img Guide  11/08/2013   CLINICAL DATA:  Post LVAD procedure, shortness of breath. Left-sided pleural effusion. Request diagnostic and therapeutic thoracentesis  EXAM: ULTRASOUND GUIDED left THORACENTESIS  COMPARISON:  None.  FINDINGS: A total of approximately 1.5 L of amber/ blood-tinged fluid was removed. A fluid sample wassent for laboratory analysis.  IMPRESSION: Successful ultrasound guided left thoracentesis yielding 1.5 L of pleural fluid.  Read by: Ascencion Dike PA-C  PROCEDURE: An ultrasound guided thoracentesis was thoroughly discussed with the patient and questions answered. The benefits, risks, alternatives and complications were also discussed. The patient understands and wishes to proceed with the procedure. Written consent was obtained.  Ultrasound was performed to localize and mark an adequate pocket of fluid in the left chest. The area  was then prepped and draped in the normal sterile fashion. 1% Lidocaine was used for local anesthesia. Under ultrasound guidance a 19 gauge Yueh catheter was introduced. Thoracentesis was performed. The catheter was removed and a dressing applied.  Complications:  None immediate   Electronically Signed   By: Malachy Moan M.D.   On: 11/08/2013 12:07     Medications:     Scheduled Medications: . amiodarone  200 mg Oral BID  . aspirin EC  325 mg Oral Daily  . bisacodyl  10 mg Oral Daily   Or  . bisacodyl  10 mg Rectal Daily  . docusate sodium  200 mg Oral Daily  . feeding supplement (ENSURE COMPLETE)  237 mL Oral BID BM  . ferrous gluconate  324 mg Oral BID WC  . guaiFENesin  600 mg Oral BID  . midodrine  5 mg Oral TID WC  . pantoprazole  40 mg Oral Daily  . potassium chloride  20 mEq Oral Daily  . ranolazine  500 mg Oral BID  . sodium chloride  3 mL Intravenous Q12H  . torsemide  20 mg  Oral Daily  . Warfarin - Physician Dosing Inpatient   Does not apply q1800    Infusions: . sodium chloride 20 mL/hr at 10/22/13 0800  . sodium chloride 10 mL/hr (11/05/13 2058)  . sodium chloride 250 mL (10/20/13 0600)  . sodium chloride 10 mL/hr (11/07/13 2150)  . sodium chloride    . milrinone 0.25 mcg/kg/min (11/09/13 0312)    PRN Medications: acetaminophen, ondansetron (ZOFRAN) IV, sodium chloride, sodium chloride, sodium chloride, traMADol   Assessment:   1) Chronic systolic HF, Class IV, EF 20-25%  --s/p HM II VAD implant 12/15  2) NICM/ICM  3) Recurrent Vtach  - ablation 09/2011 and 06/2013  4) Atrial flutter  - s/p DC-CV 10/15/2013  - Atrial fibrillation recurred post-op.  - TEE-guided DCCV 12/31 5) CAD with recanalized occluded RCA with left to right collat.  6) PAD: s/p aorto-bifem, bilateral renal artery bypass, bilateral fem-pop, and left CEA.  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  8) Pleural effusion    Plan/Discussion:    Doing much better this am. Ambulated 300 ft with no SOB. Weight is trending down and he is down 2 lbs (200), about 8 lbs above baseline. Will continue torsemide 20 mg daily. CO-ox stable 62% on 0.125 mcg milrinone. Will try to turn the speed up on his LVAD to 8800 and if his RV tolerates and co-ox is stable will try to turn milrinone off tomorrow.  Yesterday tapped 1.5 liters and reports feeling much better.  He is now on bid amiodarone and ranolazine 500 bid after discussion with Dr. Graciela Husbands to use as adjunct with amiodarone to try to maintain NSR.  QT ok on last ECG and remains A paced 70 bpm with no VT.    INR therapeutic, pharmacy dosing.  Continue to work with CR. Hopefully home at the end of the week.    I reviewed the LVAD parameters from today, and compared the results to the patient's prior recorded data.  No programming changes were made.  The LVAD is functioning within specified parameters.  The patient performs LVAD self-test daily.   LVAD interrogation was negative for any significant power changes, alarms or PI events/speed drops.  LVAD equipment check completed and is in good working order.  Back-up equipment present.   LVAD education done on emergency procedures and precautions and reviewed exit site care.  Length  of Stay: [place] NP-C  11/09/2013, 10:10 AM  Patient seen and examined with Junie Bame, NP. We discussed all aspects of the encounter. I agree with the assessment and plan as stated above.  Feels much better after thoracentesis. Co-ox improved on milrinone and with increased HR with a-pacing. Volume status improved. Will turn VAD speed up to 8800.  Suspect he will need home milrinone but if co-ox 65% or greater tomorrow may try to wean down.   VAD interrogation looks fine.   Benay Spice 3:36 PM

## 2013-11-09 NOTE — Progress Notes (Signed)
11/09/2013 6:51 PM Dr. Prescott Gum on floor and made aware of pt. Elevated MAP today ranging from 90-100. Verbal orders received to hold any further doses of Midodrine until evaluated in am. On comming RN updated on orders as well as patient on plan of care.  Licet Dunphy, Arville Lime

## 2013-11-10 DIAGNOSIS — Z95818 Presence of other cardiac implants and grafts: Secondary | ICD-10-CM

## 2013-11-10 DIAGNOSIS — I5023 Acute on chronic systolic (congestive) heart failure: Secondary | ICD-10-CM

## 2013-11-10 LAB — COMPREHENSIVE METABOLIC PANEL
ALT: 29 U/L (ref 0–53)
AST: 35 U/L (ref 0–37)
Albumin: 2.2 g/dL — ABNORMAL LOW (ref 3.5–5.2)
Alkaline Phosphatase: 102 U/L (ref 39–117)
BILIRUBIN TOTAL: 0.4 mg/dL (ref 0.3–1.2)
BUN: 19 mg/dL (ref 6–23)
CALCIUM: 8.1 mg/dL — AB (ref 8.4–10.5)
CHLORIDE: 95 meq/L — AB (ref 96–112)
CO2: 30 meq/L (ref 19–32)
Creatinine, Ser: 1.4 mg/dL — ABNORMAL HIGH (ref 0.50–1.35)
GFR, EST AFRICAN AMERICAN: 56 mL/min — AB (ref >90)
GFR, EST NON AFRICAN AMERICAN: 48 mL/min — AB (ref >90)
Glucose, Bld: 89 mg/dL (ref 70–99)
Potassium: 3.9 mEq/L (ref 3.7–5.3)
Sodium: 135 mEq/L — ABNORMAL LOW (ref 137–147)
Total Protein: 5.2 g/dL — ABNORMAL LOW (ref 6.0–8.3)

## 2013-11-10 LAB — CBC
HCT: 27.5 % — ABNORMAL LOW (ref 39.0–52.0)
Hemoglobin: 8.9 g/dL — ABNORMAL LOW (ref 13.0–17.0)
MCH: 30.5 pg (ref 26.0–34.0)
MCHC: 32.4 g/dL (ref 30.0–36.0)
MCV: 94.2 fL (ref 78.0–100.0)
PLATELETS: 309 10*3/uL (ref 150–400)
RBC: 2.92 MIL/uL — AB (ref 4.22–5.81)
RDW: 17.6 % — ABNORMAL HIGH (ref 11.5–15.5)
WBC: 6.9 10*3/uL (ref 4.0–10.5)

## 2013-11-10 LAB — PROTIME-INR
INR: 2.26 — ABNORMAL HIGH (ref 0.00–1.49)
PROTHROMBIN TIME: 24.2 s — AB (ref 11.6–15.2)

## 2013-11-10 LAB — LACTATE DEHYDROGENASE: LDH: 388 U/L — ABNORMAL HIGH (ref 94–250)

## 2013-11-10 LAB — CARBOXYHEMOGLOBIN
Carboxyhemoglobin: 1.9 % — ABNORMAL HIGH (ref 0.5–1.5)
METHEMOGLOBIN: 0.6 % (ref 0.0–1.5)
O2 Saturation: 59.1 %
Total hemoglobin: 8.8 g/dL — ABNORMAL LOW (ref 13.5–18.0)

## 2013-11-10 NOTE — Progress Notes (Signed)
Occupational Therapy Treatment Patient Details Name: MARKEE MATERA MRN: [id number] DOB: Jul 13, 1941 Today's Date: 11/10/2013 Time: 9562-1308 OT Time Calculation (min): 30 min  OT Assessment / Plan / Recommendation  History of present illness Pt admit for LVAD placement. Has been symptomatic with drops in PI (afib with aberrancy; not vtach) and treated for Rt BPPV successfully.   OT comments  Pt eager to work with OT this am.  Pt reports he has been bathing with [name] in [employer] and has been doing all but washing his back.  Requires min A for sit to stand.  His PI at beginning of session was 6.8; decreased to 6.4 when ambulating, and 8.0 upon sitting.  HR 84 with ambulation and MAP 100.  Pt ambulated ~250' without rest break.   Follow Up Recommendations  Home health OT;Supervision/Assistance - 24 hour    Barriers to Discharge       Equipment Recommendations  3 in 1 bedside comode    Recommendations for Other Services    Frequency Min 3X/week   Progress towards OT Goals Progress towards OT goals: Progressing toward goals  Plan Discharge plan remains appropriate    Precautions / Restrictions Precautions Precautions: Sternal;Fall Precaution Comments: LVAD; closely monitor PI as dropping when changing positions Required Braces or Orthoses: Other Brace/Splint Other Brace/Splint: strap for controller; vest for batteries; needs to take bag with back up supplies with him whenever he leaves his room Restrictions Weight Bearing Restrictions: Yes Other Position/Activity Restrictions: sternal precautions   Pertinent Vitals/Pain     ADL  Eating/Feeding: Independent Grooming: Wash/dry hands;Wash/dry face;Teeth care;Shaving;Supervision/safety Where Assessed - Grooming: Supported standing Upper Body Bathing: Supervision/safety;Set up Where Assessed - Upper Body Bathing: Supported sitting Lower Body Bathing: Minimal assistance Where Assessed - Lower Body Bathing: Supported sit to  stand Upper Body Dressing: Set up;Supervision/safety Where Assessed - Upper Body Dressing: Unsupported sitting Lower Body Dressing: Minimal assistance Where Assessed - Lower Body Dressing: Supported sit to stand Toilet Transfer: Minimal assistance Toilet Transfer Method: Sit to stand;Stand pivot Science writer: Bedside commode;Comfort height toilet Toileting - Clothing Manipulation and Hygiene: Supervision/safety Where Assessed - Toileting Clothing Manipulation and Hygiene: Standing Transfers/Ambulation Related to ADLs: ambulated ~250'ft with supervision ADL Comments: Pt performed self test and switched power supply <> battery with supervision.   Pt reports he has been bathing with [name] in [employer] and he is not requiring assist except to bathe back and for sit to stand.      OT Diagnosis:    OT Problem List:   OT Treatment Interventions:     OT Goals(current goals can now be found in the care plan section) ADL Goals Pt Will Perform Grooming: with supervision;standing Pt Will Perform Upper Body Bathing: with set-up;sitting Pt Will Perform Lower Body Bathing: with supervision;sit to/from stand Pt Will Perform Upper Body Dressing: with set-up;sitting Pt Will Perform Lower Body Dressing: with supervision;sit to/from stand Pt Will Transfer to Toilet: with supervision;ambulating;bedside commode;regular height toilet Pt Will Perform Toileting - Clothing Manipulation and hygiene: with supervision;sit to/from stand Additional ADL Goal #1: Pt will be independent with managing LVAD equipment during all BADLs  Additional ADL Goal #2: Pt will be independent with sternal precautions during all BADLs   Visit Information  Last OT Received On: 11/10/13 Assistance Needed: +1 History of Present Illness: Pt admit for LVAD placement. Has been symptomatic with drops in PI (afib with aberrancy; not vtach) and treated for Rt BPPV successfully.    Subjective Data  Prior Functioning        Cognition  Cognition Arousal/Alertness: Awake/alert Behavior During Therapy: WFL for tasks assessed/performed Overall Cognitive Status: Within Functional Limits for tasks assessed    Mobility  Bed Mobility Bed Mobility: Not assessed Transfers Transfers: Sit to Stand;Stand to Sit Sit to Stand: 4: Min assist;Without upper extremity assist;From chair/3-in-1 Stand to Sit: 5: Supervision;Without upper extremity assist;To chair/3-in-1 Details for Transfer Assistance: Requires momentum to move sit to stand.  First attempt, he was unable to perform on his own, second attempt, performed with min A.     Exercises      Balance     End of Session OT - End of Session Equipment Utilized During Treatment: Rolling walker;Other (comment) (LVAD equipment) Activity Tolerance: Patient limited by fatigue Patient left: in chair;with call bell/phone within reach Nurse Communication: Mobility status  Clinton, Ellard Artis M 11/10/2013, 11:09 AM

## 2013-11-10 NOTE — Progress Notes (Signed)
ANTICOAGULATION CONSULT NOTE - Follow Up Consult  Pharmacy Consult for Coumadin Indication: atrial fibrillation / s/p LVAD 12/15  Allergies  Allergen Reactions  . Demerol [Meperidine]     Paralysis. Could only move eyes.  . Meperidine Hcl Other (See Comments)    "CAN'T MOVE" CATATONIC PER PT.  [name] Opium Other (See Comments)    "CAN'T MOVE" CATATONIC PER PT.    Patient Measurements: Height: 6' (182.9 cm) Weight: 199 lb 8.3 oz (90.5 kg) IBW/kg (Calculated) : 77.6  Vital Signs: Temp: 98.8 F (37.1 C) (01/06 0400) Temp src: Oral (01/06 0400) Pulse Rate: 70 (01/06 0400)  Labs:  Recent Labs  11/08/13 0500 11/09/13 0500 11/10/13 0520  HGB 9.0* 8.9* 8.9*  HCT 27.4* 27.4* 27.5*  PLT 312 290 309  LABPROT 23.8* 23.9* 24.2*  INR 2.21* 2.22* 2.26*  CREATININE 1.37* 1.31 1.40*    Estimated Creatinine Clearance: 50.8 ml/min (by C-G formula based on Cr of 1.4).   Medical History: Past Medical History  Diagnosis Date  . CAD (coronary artery disease)     s/p MI 1982;  s/p DES to CFX 2011;  s/p NSTEMI 4/12 in setting of VTach;  cath 7/12:  LM ok, LAD mid 30-40%, Dx's with 40%, mCFX stent patent, prox to mid RCA occluded with R to R and L to R collats.  Medical Tx was continued  . Bradycardia   . Ventricular tachycardia     s/p AICD;   h/o ICD shock;  Amiodarone Rx. S/P ablation in 2012  . 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 HTN (hypertension)   . HLD (hyperlipidemia)   . Cytopenia   . Hypothyroidism   . Renal insufficiency   . Claudication   . Chronic systolic heart failure   . Ischemic cardiomyopathy     echo 7/12:  EF 25-30%, mild AI, mod MR, mod LAE  . TIA (transient ischemic attack) 2001  . CVD (cerebrovascular disease)     left CEA 2008  . Stroke   . Anemia   . Inappropriate therapy from implantable cardioverter-defibrillator  04/02/2013    ATP for sinus tach  SVT wavelet identified SVT but was no  passive   . Myocardial infarction 1992  . Anginal pain   . Automatic implantable cardioverter-defibrillator in situ     [employer]onic Evera device serial number [id number]    . Sleep apnea     denies wearing mask (05/25/2013)  . Arthritis     "a little in my knees" (05/25/2013)  . Melanoma of ear 2013    "near left ear" (05/25/2013)    Assessment: 73 yo M with HF s/p LVAD placement 12/15.  Afib > SR 70s s/p DCCV 12/31. Current tx amiodarone, ranolazine, digoxin.   INR 2.26 today, cbc low stable watch, no bleeding noted, on iron supplement bid.  Goal of Therapy:  INR 2-3 Monitor platelets by anticoagulation protocol: Yes   Plan:  Coumadin 2mg  daily Daily INR  Uvaldo Rising, BCPS  Clinical Pharmacist Pager [phone]  11/10/2013 1:22 PM

## 2013-11-10 NOTE — Progress Notes (Signed)
CARDIAC REHAB PHASE I   PRE:  Rate/Rhythm: 73SR  BP:  Supine:   Sitting: 84 dopplered  Standing:    SaO2: 97%2L, 92%RA  MODE:  Ambulation: 150 ft   POST:  Rate/Rhythm: 84  BP:  Supine:   Sitting: 88 dopplered  Standing:    SaO2: 90%RA room, could not get to register in hall [id number] Pt willing to see how oxygen sats on RA to see if oxygen still needed during walk. Pt was 92%RA prior to leaving room. Tried to get sats several times during walk but would not register until he sat in room. 90%RA at that time. Pt walked 150 ft on RA with rollator and asst x 1. Followed with wheelchair as precaution. Pt had PI as low as 3.0 during walk but pt did not need to sit or did not feel weak. Pt stated his breathing really was not that affected by not wearing oxgyen. Put back on 2L for comfort. Pt did c/o right knee pain that affected distance. PI 7.6 when sitting in recliner after walk. Did not stay at 3.0 long. To 3.8 and then up.   Graylon Good, RN BSN  11/10/2013 2:35 PM

## 2013-11-10 NOTE — Progress Notes (Signed)
PT Cancellation Note  Patient Details Name: [name] MRN: [id number] DOB: 12/06/1940   Cancelled Treatment:    Reason Treat Not Completed: Pain limiting ability to participate--pt reports he sat up in recliner too long and his back is hurting too badly to get OOB. Reports he just laid down 10 minutes prior. Offered to return in ~30 minutes and he again refused. States he'll work with PT tomorrow.   Latriece Anstine 11/10/2013, 4:34 PM Pager [phone]

## 2013-11-10 NOTE — Progress Notes (Signed)
Patient ID: [name], male   DOB: 1941/02/07, 73 y.o.   MRN: [id number]  HeartMate 2 Rounding Note  Subjective:    [name] is a 73 yo man with an ischemic CM s/p ICD implant, chronic systolic HF (EF 06-26%), paroxysmal VT s/p ablation (09/2011) and repeat (06/2013), CAD, PVD (s/p R fem pop BPG 01/2010; s/p L CFA BPG 05/2002), AAA (s/p repair 04/2002), CAS (s/p L CEA 2008), HTN, arthritis, and TIA. Recently admitted 12/8-12/11/14 for syncopal episode from inappropriate ATP which accelerated SVT>>VT with failed ATP and then spontaneous termination of VT. During hospital stay had repeat RHC, hemodynamics were stable and had TEE-DC-CV of AFL 10/15/13 which was successful. He was presented to our [facility] team and felt to be good LVAD candidate and then presented at [facility] transplant listing conference and they felt he was not a candidate for transplant, but good candidate for LVAD under DT criteria.   Echo 12/27: LV is small with EF 15%. RV not significantly dilated but severely HK.  12/30: Milrinone begun with decreased Coox  12/31: TEE-guided DCCV back to NSR.   Remains on milrinone 0.25 and torsemide 20 daily. Continues to diurese slowly. VAD turned up to 88 yesterday and did not tolerate due to PI events. Otherwise feeling well. Ambulating, Remains a-paced 96.   LDH 381->417>410 Co-ox 55%->53%->56%->60%>62%>59   LVAD INTERROGATION:  HeartMate II LVAD:  Flow 5.4  liters/min, speed 8600, power 5.1,  PI 7.5      Objective:    Vital Signs:   Temp:  [97.6 F (36.4 C)-98.8 F (37.1 C)] 98.8 F (37.1 C) (01/06 0400) Pulse Rate:  [70-72] 70 (01/06 0400) Resp:  [16-18] 16 (01/06 0400) SpO2:  [96 %-98 %] 96 % (01/06 0400) Weight:  [90.5 kg (199 lb 8.3 oz)] 90.5 kg (199 lb 8.3 oz) (01/06 0505) Last BM Date: 11/07/13  Intake/Output:   Intake/Output Summary (Last 24 hours) at 11/10/13 0736 Last data filed at 11/10/13 0025  Gross per 24 hour  Intake    720 ml  Output   2000 ml  Net   -1280 ml     Physical Exam: General: Sitting in chair. NAD HEENT: normal  Neck: supple. JVP 7 Carotids 2+ bilat; no bruits. No lymphadenopathy or thryomegaly appreciated.  Cor: Faint heart sounds , LVAD hum present.  Lungs: CTA Abdomen: soft, nontender, nondistended. No hepatosplenomegaly. No bruits or masses. Active bowel sounds.  Driveline: C/D/I; securement device intact and driveline incorporated  Extremities: warm. TED hose in place; trace edema' RUE PICC 2L Neuro: alert & orientedx3, cranial nerves grossly intact. moves all 4 extremities w/o difficulty. Affect pleasant    Telemetry: a-paced, 70  Labs: Basic Metabolic Panel:  Recent Labs Lab 11/04/13 0500  11/06/13 0610 11/07/13 0537 11/08/13 0500 11/09/13 0500 11/10/13 0520  NA 135*  < > 136* 136* 136* 136* 135*  K 3.5*  < > 4.0 3.9 3.9 3.9 3.9  CL 97  < > 98 97 97 97 95*  CO2 29  < > 28 28 30 29 30   GLUCOSE 99  < > 93 89 96 90 89  BUN 14  < > 14 14 16 17 19   CREATININE 1.28  < > 1.15 1.24 1.37* 1.31 1.40*  CALCIUM 7.9*  < > 7.9* 8.0* 8.1* 8.0* 8.1*  MG 2.1  --   --   --   --   --   --   < > = values in this interval not displayed.  Liver Function Tests:  Recent Labs Lab 11/06/13 0610 11/08/13 0500 11/09/13 0500 11/10/13 0520  AST 29 36 39* 35  ALT 26 27 30 29   ALKPHOS 103 108 103 102  BILITOT 0.5 0.5 0.5 0.4  PROT 5.2* 5.3* 5.2* 5.2*  ALBUMIN 2.1* 2.2* 2.1* 2.2*   No results found for this basename: LIPASE, AMYLASE,  in the last 168 hours No results found for this basename: AMMONIA,  in the last 168 hours  CBC:  Recent Labs Lab 11/06/13 0610 11/07/13 0537 11/08/13 0500 11/09/13 0500 11/10/13 0520  WBC 7.0 7.1 8.0 7.2 6.9  HGB 8.8* 8.6* 9.0* 8.9* 8.9*  HCT 27.2* 26.2* 27.4* 27.4* 27.5*  MCV 95.1 92.6 93.5 94.2 94.2  PLT 327 364 312 290 309    INR:  Recent Labs Lab 11/06/13 0610 11/07/13 0537 11/08/13 0500 11/09/13 0500 11/10/13 0520  INR 2.27* 2.18* 2.21* 2.22* 2.26*   Other  results:    Imaging: Dg Chest 1 View  11/08/2013   CLINICAL DATA:  Left thoracentesis.  EXAM: CHEST - 1 VIEW  COMPARISON:  11/08/2013 at 80 04 a.m.  FINDINGS: Significantly reduced size of left pleural effusion. No significant pneumothorax observed. The very tip of the left hemithorax was excluded from imaging but even if there was a pneumothorax above this a would only be in the 1-2% range, and accordingly does not really warrant reimaging.  Left ventricular assist device noted. AICD in place. Stably enlarged cardiopericardial silhouette. Indistinct vasculature suggesting pulmonary venous hypertension. Right central line tip: SVC.  IMPRESSION: 1. Reduced size of left pleural effusion. No pneumothorax or complicating feature observed. 2. Cardiomegaly with pulmonary venous hypertension.   Electronically Signed   By: Sherryl Barters M.D.   On: 11/08/2013 12:46   Dg Chest Port 1 View  11/08/2013   CLINICAL DATA:  Status post left ventricular assist device placement.  EXAM: PORTABLE CHEST - 1 VIEW  COMPARISON:  Chest x-ray 11/04/2013.  FINDINGS: An LVAD is again noted projecting over the lower thorax and upper abdomen. The inflow cannula appears oriented along the left ventricular long axis, with slight cephalad angulation. Lung volumes are normal. Persistent opacity at the base of the left hemithorax may reflect atelectasis and/or consolidation, with superimposed moderate left pleural effusion. Right lung appears clear. Pulmonary venous congestion, without frank pulmonary edema. Heart size remains enlarged. Atherosclerosis in the thoracic aorta. Left-sided pacemaker/AICD with lead tips projecting over the expected location of the right atrium and right ventricular apex. There is a right upper extremity PICC with tip terminating in the mid superior vena cava. Status post median sternotomy.  IMPRESSION: 1. Overall, allowing for slight differences in patient positioning, the radiographic appearance the chest is  essentially unchanged, as detailed above.   Electronically Signed   By: Vinnie Langton M.D.   On: 11/08/2013 11:10   US Thoracentesis Asp Pleural Space W/img Guide  11/08/2013   CLINICAL DATA:  Post LVAD procedure, shortness of breath. Left-sided pleural effusion. Request diagnostic and therapeutic thoracentesis  EXAM: ULTRASOUND GUIDED left THORACENTESIS  COMPARISON:  None.  FINDINGS: A total of approximately 1.5 L of amber/ blood-tinged fluid was removed. A fluid sample wassent for laboratory analysis.  IMPRESSION: Successful ultrasound guided left thoracentesis yielding 1.5 L of pleural fluid.  Read by: Ascencion Dike PA-C  PROCEDURE: An ultrasound guided thoracentesis was thoroughly discussed with the patient and questions answered. The benefits, risks, alternatives and complications were also discussed. The patient understands and wishes to proceed with the procedure.  Written consent was obtained.  Ultrasound was performed to localize and mark an adequate pocket of fluid in the left chest. The area was then prepped and draped in the normal sterile fashion. 1% Lidocaine was used for local anesthesia. Under ultrasound guidance a 19 gauge Yueh catheter was introduced. Thoracentesis was performed. The catheter was removed and a dressing applied.  Complications:  None immediate   Electronically Signed   By: Jacqulynn Cadet M.D.   On: 11/08/2013 12:07     Medications:     Scheduled Medications: . amiodarone  200 mg Oral BID  . aspirin EC  325 mg Oral Daily  . bisacodyl  10 mg Oral Daily   Or  . bisacodyl  10 mg Rectal Daily  . digoxin  0.0625 mg Oral Daily  . docusate sodium  200 mg Oral Daily  . feeding supplement (ENSURE COMPLETE)  237 mL Oral BID BM  . ferrous gluconate  324 mg Oral BID WC  . guaiFENesin  600 mg Oral BID  . midodrine  5 mg Oral TID WC  . pantoprazole  40 mg Oral Daily  . potassium chloride  20 mEq Oral Daily  . ranolazine  500 mg Oral BID  . sodium chloride  3 mL  Intravenous Q12H  . torsemide  20 mg Oral Daily  . warfarin  2 mg Oral q1800  . Warfarin - Pharmacist Dosing Inpatient   Does not apply q1800    Infusions: . sodium chloride 20 mL/hr at 10/22/13 0800  . sodium chloride 10 mL/hr (11/05/13 2058)  . sodium chloride 250 mL (10/20/13 0600)  . sodium chloride 10 mL/hr (11/07/13 2150)  . sodium chloride    . milrinone 0.25 mcg/kg/min (11/09/13 1831)    PRN Medications: acetaminophen, ondansetron (ZOFRAN) IV, sodium chloride, sodium chloride, sodium chloride, traMADol   Assessment:   1) Chronic systolic HF, Class IV, EF 20-25%  --s/p HM II VAD implant 12/15  2) NICM/ICM  3) Recurrent Vtach  - ablation 09/2011 and 06/2013  4) Atrial flutter  - s/p DC-CV 10/15/2013  - Atrial fibrillation recurred post-op.  - TEE-guided DCCV 12/31 5) CAD with recanalized occluded RCA with left to right collat.  6) PAD: s/p aorto-bifem, bilateral renal artery bypass, bilateral fem-pop, and left CEA.  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  8) Pleural effusion s/p thoracentesis 1/4    Plan/Discussion:    Improing slowly but remains milrinone dependent. Weight coming down. Will continue slow diuresis with po torsemide as renal function and hemodynamics tolerate.   Continue po amiodarone and ranolazine 500 bid  to try to maintain NSR.  QT ok on last ECG and remains A paced 70 bpm with no VT.     INR therapeutic, pharmacy dosing.  Continue to work with CR. Hopefully home at the end of the week.    I reviewed the LVAD parameters from today, and compared the results to the patient's prior recorded data.  No programming changes were made.  The LVAD is functioning within specified parameters.  The patient performs LVAD self-test daily.  LVAD interrogation was negative for any significant power changes, alarms or PI events/speed drops.  LVAD equipment check completed and is in good working order.  Back-up equipment present.   LVAD education done on emergency  procedures and precautions and reviewed exit site care.  Length of Stay: 23  Glori Bickers MD  11/10/2013, 7:36 AM

## 2013-11-10 NOTE — Progress Notes (Signed)
UR Completed.  [name] [name] [phone] 11/10/2013  

## 2013-11-10 NOTE — Progress Notes (Signed)
Patient ID: [name], male   DOB: 1941-10-14, 73 y.o.   MRN: [id number]  HeartMate 2 Rounding Note  Subjective:    [name] is a 73 yo man with an ischemic CM s/p ICD implant, chronic systolic HF (EF 23-55%), paroxysmal VT s/p ablation (09/2011) and repeat (06/2013), CAD, PVD (s/p R fem pop BPG 01/2010; s/p L CFA BPG 05/2002), AAA (s/p repair 04/2002), CAS (s/p L CEA 2008), HTN, arthritis, and TIA. Recently admitted 12/8-12/11/14 for syncopal episode from inappropriate ATP which accelerated SVT>>VT with failed ATP and then spontaneous termination of VT. During hospital stay had repeat RHC, hemodynamics were stable and had TEE-DC-CV of AFL 10/15/13 which was successful. He was presented to our [facility] team and felt to be good LVAD candidate and then presented at [facility] transplant listing conference and they felt he was not a candidate for transplant, but good candidate for LVAD under DT criteria.   POD 22 HeartMate II LVAD   Remains on milrinone. Co-ox 59%  -1280 for 24 hrs. Wt down 1 lb to 199.  Ambulated 250 ft with no significant PI change and mild shortness of breath.    LVAD INTERROGATION:  HeartMate II LVAD:  Flow 5.4 liters/min, speed 8600, power 5.1, PI 7.5.    Objective:    Vital Signs:   Temp:  [97.6 F (36.4 C)-98.8 F (37.1 C)] 98.8 F (37.1 C) (01/06 0400) Pulse Rate:  [70-72] 70 (01/06 0400) Resp:  [16-18] 16 (01/06 0400) SpO2:  [96 %-98 %] 96 % (01/06 0400) Weight:  [90.5 kg (199 lb 8.3 oz)] 90.5 kg (199 lb 8.3 oz) (01/06 0505) Last BM Date: 11/07/13 Mean arterial Pressure 98  Intake/Output:   Intake/Output Summary (Last 24 hours) at 11/10/13 1135 Last data filed at 11/10/13 0025  Gross per 24 hour  Intake    480 ml  Output   1900 ml  Net  -1420 ml     Physical Exam: General: Awake and alert. Conversant.  HEENT: normal  Neck: supple. Carotids 2+ bilat; no bruits. No lymphadenopathy or thryomegaly appreciated.  Cor: Faint heart sounds , LVAD hum  present.  Lungs: clear but decreased breath sounds at left base.  Abdomen: soft, nontender, nondistended. No hepatosplenomegaly. No bruits or masses. Active bowel sounds.  Driveline: C/D/I; securement device intact and driveline incorporated  Extremities: moderate peripheral edema in feet and lower legs  Neuro: alert & orientedx3, cranial nerves grossly intact. moves all 4 extremities w/o difficulty. Affect pleasant     Telemetry: pacing at 70  Labs: Basic Metabolic Panel:  Recent Labs Lab 11/04/13 0500  11/06/13 0610 11/07/13 0537 11/08/13 0500 11/09/13 0500 11/10/13 0520  NA 135*  < > 136* 136* 136* 136* 135*  K 3.5*  < > 4.0 3.9 3.9 3.9 3.9  CL 97  < > 98 97 97 97 95*  CO2 29  < > 28 28 30 29 30   GLUCOSE 99  < > 93 89 96 90 89  BUN 14  < > 14 14 16 17 19   CREATININE 1.28  < > 1.15 1.24 1.37* 1.31 1.40*  CALCIUM 7.9*  < > 7.9* 8.0* 8.1* 8.0* 8.1*  MG 2.1  --   --   --   --   --   --   < > = values in this interval not displayed.  Liver Function Tests:  Recent Labs Lab 11/06/13 0610 11/08/13 0500 11/09/13 0500 11/10/13 0520  AST 29 36 39* 35  ALT 26  27 30 29   ALKPHOS 103 108 103 102  BILITOT 0.5 0.5 0.5 0.4  PROT 5.2* 5.3* 5.2* 5.2*  ALBUMIN 2.1* 2.2* 2.1* 2.2*   No results found for this basename: LIPASE, AMYLASE,  in the last 168 hours No results found for this basename: AMMONIA,  in the last 168 hours  CBC:  Recent Labs Lab 11/06/13 0610 11/07/13 0537 11/08/13 0500 11/09/13 0500 11/10/13 0520  WBC 7.0 7.1 8.0 7.2 6.9  HGB 8.8* 8.6* 9.0* 8.9* 8.9*  HCT 27.2* 26.2* 27.4* 27.4* 27.5*  MCV 95.1 92.6 93.5 94.2 94.2  PLT 327 364 312 290 309    INR:  Recent Labs Lab 11/06/13 0610 11/07/13 0537 11/08/13 0500 11/09/13 0500 11/10/13 0520  INR 2.27* 2.18* 2.21* 2.22* 2.26*   LDH 388   Other results:  EKG:   Imaging: Dg Chest 1 View  11/08/2013   CLINICAL DATA:  Left thoracentesis.  EXAM: CHEST - 1 VIEW  COMPARISON:  11/08/2013 at 80 04  a.m.  FINDINGS: Significantly reduced size of left pleural effusion. No significant pneumothorax observed. The very tip of the left hemithorax was excluded from imaging but even if there was a pneumothorax above this a would only be in the 1-2% range, and accordingly does not really warrant reimaging.  Left ventricular assist device noted. AICD in place. Stably enlarged cardiopericardial silhouette. Indistinct vasculature suggesting pulmonary venous hypertension. Right central line tip: SVC.  IMPRESSION: 1. Reduced size of left pleural effusion. No pneumothorax or complicating feature observed. 2. Cardiomegaly with pulmonary venous hypertension.   Electronically Signed   By: Sherryl Barters M.D.   On: 11/08/2013 12:46   US Thoracentesis Asp Pleural Space W/img Guide  11/08/2013   CLINICAL DATA:  Post LVAD procedure, shortness of breath. Left-sided pleural effusion. Request diagnostic and therapeutic thoracentesis  EXAM: ULTRASOUND GUIDED left THORACENTESIS  COMPARISON:  None.  FINDINGS: A total of approximately 1.5 L of amber/ blood-tinged fluid was removed. A fluid sample wassent for laboratory analysis.  IMPRESSION: Successful ultrasound guided left thoracentesis yielding 1.5 L of pleural fluid.  Read by: Ascencion Dike PA-C  PROCEDURE: An ultrasound guided thoracentesis was thoroughly discussed with the patient and questions answered. The benefits, risks, alternatives and complications were also discussed. The patient understands and wishes to proceed with the procedure. Written consent was obtained.  Ultrasound was performed to localize and mark an adequate pocket of fluid in the left chest. The area was then prepped and draped in the normal sterile fashion. 1% Lidocaine was used for local anesthesia. Under ultrasound guidance a 19 gauge Yueh catheter was introduced. Thoracentesis was performed. The catheter was removed and a dressing applied.  Complications:  None immediate   Electronically Signed   By: Jacqulynn Cadet M.D.   On: 11/08/2013 12:07      Medications:     Scheduled Medications: . amiodarone  200 mg Oral BID  . aspirin EC  325 mg Oral Daily  . bisacodyl  10 mg Oral Daily   Or  . bisacodyl  10 mg Rectal Daily  . digoxin  0.0625 mg Oral Daily  . docusate sodium  200 mg Oral Daily  . feeding supplement (ENSURE COMPLETE)  237 mL Oral BID BM  . ferrous gluconate  324 mg Oral BID WC  . guaiFENesin  600 mg Oral BID  . midodrine  5 mg Oral TID WC  . pantoprazole  40 mg Oral Daily  . potassium chloride  20 mEq Oral Daily  .  ranolazine  500 mg Oral BID  . sodium chloride  3 mL Intravenous Q12H  . torsemide  20 mg Oral Daily  . warfarin  2 mg Oral q1800  . Warfarin - Pharmacist Dosing Inpatient   Does not apply q1800     Infusions: . sodium chloride 20 mL/hr at 10/22/13 0800  . sodium chloride 10 mL/hr (11/05/13 2058)  . sodium chloride 250 mL (10/20/13 0600)  . sodium chloride 10 mL/hr (11/07/13 2150)  . sodium chloride    . milrinone 0.25 mcg/kg/min (11/10/13 0905)     PRN Medications:  acetaminophen, ondansetron (ZOFRAN) IV, sodium chloride, sodium chloride, sodium chloride, traMADol   Assessment:   1) Chronic systolic HF, Class IV, EF 20-25%  --s/p HM II VAD implant 12/15  2) NICM/ICM  3) Recurrent Vtach  - ablation 09/2011 and 06/2013  4) Atrial flutter  - s/p DC-CV 10/15/2013  5) CAD with recanalized occluded RCA with left to right collat.  6) PAD: s/p aorto-bifem, bilateral renal artery bypass, bilateral fem-pop, and left CEA.  7) Severe RV dysfunction by 2D echo 10/31/2013    Plan/Discussion:    He is improving slowly on Milrinone, in sinus rhythm, after left thoracentesis. He is diuresing but is probably intravascularly fairly dry with darker urine and creat starting to rise. Albumin remains low at 2.2 so he is going to 3rd space. His breath sounds at the left base are not as good as yesterday and he may be reaccumulating the left pleural effusion.  Will repeat his cxr in the am.  I reviewed the LVAD parameters from today, and compared the results to the patient's prior recorded data.  No programming changes were made.  The LVAD is functioning within specified parameters.  The patient performs LVAD self-test daily.  LVAD interrogation was negative for any significant power changes, alarms or PI events/speed drops.  LVAD equipment check completed and is in good working order.  Back-up equipment present.   LVAD education done on emergency procedures and precautions and reviewed exit site care.  Length of Stay: 23  BARTLE,BRYAN K 11/10/2013, 11:35 AM  VAD Team Pager [phone] (7am - 7am)

## 2013-11-11 ENCOUNTER — Inpatient Hospital Stay (HOSPITAL_COMMUNITY): Payer: Medicare Other

## 2013-11-11 DIAGNOSIS — I5023 Acute on chronic systolic (congestive) heart failure: Secondary | ICD-10-CM

## 2013-11-11 DIAGNOSIS — Z95818 Presence of other cardiac implants and grafts: Secondary | ICD-10-CM

## 2013-11-11 LAB — CARBOXYHEMOGLOBIN
Carboxyhemoglobin: 2.1 % — ABNORMAL HIGH (ref 0.5–1.5)
Methemoglobin: 0.8 % (ref 0.0–1.5)
O2 SAT: 64.1 %
TOTAL HEMOGLOBIN: 8.6 g/dL — AB (ref 13.5–18.0)

## 2013-11-11 LAB — COMPREHENSIVE METABOLIC PANEL
ALT: 26 U/L (ref 0–53)
AST: 32 U/L (ref 0–37)
Albumin: 2.2 g/dL — ABNORMAL LOW (ref 3.5–5.2)
Alkaline Phosphatase: 97 U/L (ref 39–117)
BUN: 18 mg/dL (ref 6–23)
CO2: 28 mEq/L (ref 19–32)
CREATININE: 1.32 mg/dL (ref 0.50–1.35)
Calcium: 8.2 mg/dL — ABNORMAL LOW (ref 8.4–10.5)
Chloride: 96 mEq/L (ref 96–112)
GFR calc Af Amer: 60 mL/min — ABNORMAL LOW (ref >90)
GFR, EST NON AFRICAN AMERICAN: 51 mL/min — AB (ref >90)
GLUCOSE: 98 mg/dL (ref 70–99)
Potassium: 3.8 mEq/L (ref 3.7–5.3)
Sodium: 136 mEq/L — ABNORMAL LOW (ref 137–147)
Total Bilirubin: 0.4 mg/dL (ref 0.3–1.2)
Total Protein: 5.2 g/dL — ABNORMAL LOW (ref 6.0–8.3)

## 2013-11-11 LAB — LACTATE DEHYDROGENASE: LDH: 387 U/L — AB (ref 94–250)

## 2013-11-11 LAB — CBC
HCT: 26.5 % — ABNORMAL LOW (ref 39.0–52.0)
Hemoglobin: 8.8 g/dL — ABNORMAL LOW (ref 13.0–17.0)
MCH: 30.8 pg (ref 26.0–34.0)
MCHC: 33.2 g/dL (ref 30.0–36.0)
MCV: 92.7 fL (ref 78.0–100.0)
PLATELETS: 303 10*3/uL (ref 150–400)
RBC: 2.86 MIL/uL — ABNORMAL LOW (ref 4.22–5.81)
RDW: 17.5 % — AB (ref 11.5–15.5)
WBC: 6.8 10*3/uL (ref 4.0–10.5)

## 2013-11-11 LAB — PROTIME-INR
INR: 2.37 — AB (ref 0.00–1.49)
PROTHROMBIN TIME: 25.1 s — AB (ref 11.6–15.2)

## 2013-11-11 NOTE — Progress Notes (Signed)
CSW discussed potential discharge end of the week with Zada Girt, [place]. Cloyde Reams will continue with bedside teaching of caregiver and back up for discharge to home. CSW met with patient and caregiver [name] at bedside this morning. Patient verbalized understanding of pending discharge and said "I'm ready". Patient's caregiver [name] reported she started FMLA leave from work today and ready for patient to return home. Patient sitting up in chair at bedside and smiled during entire conversation regarding discharge home. Patient states he is feeling well, stronger and positive about his new life opportunity. CSW will continue to follow for support during recovery. [name], [place]

## 2013-11-11 NOTE — Progress Notes (Signed)
Patient ID: BRENNDEN MASTEN, male   DOB: January 09, 1941, 73 y.o.   MRN: [id number]  HeartMate 2 Rounding Note  Subjective:    Kyros Salzwedel is a 73 yo man with an ischemic CM s/p ICD implant, chronic systolic HF (EF 22-48%), paroxysmal VT s/p ablation (09/2011) and repeat (06/2013), CAD, PVD (s/p R fem pop BPG 01/2010; s/p L CFA BPG 05/2002), AAA (s/p repair 04/2002), CAS (s/p L CEA 2008), HTN, arthritis, and TIA. Recently admitted 12/8-12/11/14 for syncopal episode from inappropriate ATP which accelerated SVT>>VT with failed ATP and then spontaneous termination of VT. During hospital stay had repeat RHC, hemodynamics were stable and had TEE-DC-CV of AFL 10/15/13 which was successful. He was presented to our [facility] team and felt to be good LVAD candidate and then presented at [facility] transplant listing conference and they felt he was not a candidate for transplant, but good candidate for LVAD under DT criteria.   Echo 12/27: LV is small with EF 15%. RV not significantly dilated but severely HK.  12/30: Milrinone begun with decreased Coox  12/31: TEE-guided DCCV back to NSR.   Remains on milrinone 0.25 and torsemide 20 daily. Continues to diurese slowly. VAD remains at [place] with few PI events noted over last 24 hours. Feeling well.  Ambulating, walked 400 feet yesterday, limited by right knee pain.  Remains a-paced 70. MAPs 78 - 84, remains on midodrine 5 mg tid.  CXR this morning following left thoracentesis on 11/08/13  LDH 381->417>410 Co-ox 55%->53%->56%->60%>62%>59>64%   LVAD INTERROGATION:  HeartMate II LVAD:  Flow 5.1  liters/min, speed 8600, power 5.0,  PI 7.2 Alarms: none Events: 8 PI events       Objective:    Vital Signs:   Temp:  [98.4 F (36.9 C)-98.9 F (37.2 C)] 98.9 F (37.2 C) (01/07 0400) Pulse Rate:  [72-73] 73 (01/07 0400) Resp:  [16] 16 (01/07 0400) SpO2:  [94 %-97 %] 94 % (01/07 0400) Weight:  [90.7 kg (199 lb 15.3 oz)] 90.7 kg (199 lb 15.3 oz) (01/07 0500) Last BM Date:  11/07/13  Intake/Output:   Intake/Output Summary (Last 24 hours) at 11/11/13 0750 Last data filed at 11/11/13 0520  Gross per 24 hour  Intake    520 ml  Output   2000 ml  Net  -1480 ml     Physical Exam: General: Sitting in chair. NAD HEENT: normal  Neck: supple. JVP 7 Carotids 2+ bilat; no bruits. No lymphadenopathy or thryomegaly appreciated.  Cor: Faint heart sounds , LVAD hum present.  Lungs: CTA Abdomen: soft, nontender, nondistended. No hepatosplenomegaly. No bruits or masses. Active bowel sounds.  Driveline: C/D/I; securement device intact and driveline incorporated  Extremities: warm. TED hose in place; trace edema' RUE PICC 2L Neuro: alert & orientedx3, cranial nerves grossly intact. moves all 4 extremities w/o difficulty. Affect pleasant   Telemetry: a-paced, 70  Labs: Basic Metabolic Panel:  Recent Labs Lab 11/06/13 0610 11/07/13 0537 11/08/13 0500 11/09/13 0500 11/10/13 0520  NA 136* 136* 136* 136* 135*  K 4.0 3.9 3.9 3.9 3.9  CL 98 97 97 97 95*  CO2 28 28 30 29 30   GLUCOSE 93 89 96 90 89  BUN 14 14 16 17 19   CREATININE 1.15 1.24 1.37* 1.31 1.40*  CALCIUM 7.9* 8.0* 8.1* 8.0* 8.1*    Liver Function Tests:  Recent Labs Lab 11/06/13 0610 11/08/13 0500 11/09/13 0500 11/10/13 0520  AST 29 36 39* 35  ALT 26 27 30 29   ALKPHOS 103 108  103 102  BILITOT 0.5 0.5 0.5 0.4  PROT 5.2* 5.3* 5.2* 5.2*  ALBUMIN 2.1* 2.2* 2.1* 2.2*   No results found for this basename: LIPASE, AMYLASE,  in the last 168 hours No results found for this basename: AMMONIA,  in the last 168 hours  CBC:  Recent Labs Lab 11/07/13 0537 11/08/13 0500 11/09/13 0500 11/10/13 0520 11/11/13 0557  WBC 7.1 8.0 7.2 6.9 6.8  HGB 8.6* 9.0* 8.9* 8.9* 8.8*  HCT 26.2* 27.4* 27.4* 27.5* 26.5*  MCV 92.6 93.5 94.2 94.2 92.7  PLT 364 312 290 309 303    INR:  Recent Labs Lab 11/07/13 0537 11/08/13 0500 11/09/13 0500 11/10/13 0520 11/11/13 0557  INR 2.18* 2.21* 2.22* 2.26* 2.37*    Other results:    Imaging: No results found.   Medications:     Scheduled Medications: . amiodarone  200 mg Oral BID  . aspirin EC  325 mg Oral Daily  . bisacodyl  10 mg Oral Daily   Or  . bisacodyl  10 mg Rectal Daily  . digoxin  0.0625 mg Oral Daily  . docusate sodium  200 mg Oral Daily  . feeding supplement (ENSURE COMPLETE)  237 mL Oral BID BM  . ferrous gluconate  324 mg Oral BID WC  . guaiFENesin  600 mg Oral BID  . midodrine  5 mg Oral TID WC  . pantoprazole  40 mg Oral Daily  . potassium chloride  20 mEq Oral Daily  . ranolazine  500 mg Oral BID  . sodium chloride  3 mL Intravenous Q12H  . torsemide  20 mg Oral Daily  . warfarin  2 mg Oral q1800  . Warfarin - Pharmacist Dosing Inpatient   Does not apply q1800    Infusions: . sodium chloride 20 mL/hr at 10/22/13 0800  . sodium chloride 10 mL/hr (11/05/13 2058)  . sodium chloride 250 mL (10/20/13 0600)  . sodium chloride 10 mL/hr (11/07/13 2150)  . sodium chloride    . milrinone 0.25 mcg/kg/min (11/11/13 0202)    PRN Medications: acetaminophen, ondansetron (ZOFRAN) IV, sodium chloride, sodium chloride, sodium chloride, traMADol   Assessment:   1) Chronic systolic HF, Class IV, EF 20-25%  --s/p HM II VAD implant 12/15  2) NICM/ICM  3) Recurrent Vtach  - ablation 09/2011 and 06/2013  4) Atrial flutter  - s/p DC-CV 10/15/2013  - Atrial fibrillation recurred post-op.  - TEE-guided DCCV 12/31 5) CAD with recanalized occluded RCA with left to right collat.  6) PAD: s/p aorto-bifem, bilateral renal artery bypass, bilateral fem-pop, and left CEA.  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  8) Pleural effusion s/p thoracentesis 1/4    Plan/Discussion:    Continues to make slow progress and was able to ambulate in the hall BID yesterday about 352ft. Weight remains stable at 199 lbs and 24 hr I/O -1.5 liters. Will continue to slowly diurese patient with 20 mg torsemide daily. Co-ox this am 64%, will try to wean  milrinone down to .125 mcg.   Continue po amiodarone and ranolazine 500 bid  to try to maintain NSR. A paced 70 bpm with no VT.     INR therapeutic, pharmacy dosing.  Continue to work with CR. Hopefully home at the end of the week.    Will place for home O2 orders.   I reviewed the LVAD parameters from today, and compared the results to the patient's prior recorded data.  No programming changes were made.  The LVAD is  functioning within specified parameters.  The patient performs LVAD self-test daily.  LVAD interrogation was negative for any significant power changes, alarms or PI events/speed drops.  LVAD equipment check completed and is in good working order.  Back-up equipment present.   LVAD education done on emergency procedures and precautions and reviewed exit site care.  Length of Stay: 24  Patient seen and examined with Junie Bame, NP. We discussed all aspects of the encounter. I agree with the assessment and plan as stated above.   He continues to improve. Volume status improving. Still drops PI slightly but asymptomatic. Co-ox 64%. Will drop milrinone back to 0.125. Maintaining SR. With V-rate set at 48. Continue to ambulate. VAD parameters interrogated personally. Possibly home Friday.  Mika Griffitts,MD 2:01 PM

## 2013-11-11 NOTE — Progress Notes (Signed)
The Chaplain stopped by for an initial visit with the patient but the patient was resting. Follow up with the patient will be carried out by the Chaplain.  Chaplain Clista Bernhardt [name]

## 2013-11-11 NOTE — Progress Notes (Signed)
Patient ID: NKOSI CORTRIGHT, male   DOB: [birth year], 73 y.o.   MRN: [id number] HeartMate 2 Rounding Note  Subjective:    Bertel Venard is a 73 yo man with an ischemic CM s/p ICD implant, chronic systolic HF (EF 47-09%), paroxysmal VT s/p ablation (09/2011) and repeat (06/2013), CAD, PVD (s/p R fem pop BPG 01/2010; s/p L CFA BPG 05/2002), AAA (s/p repair 04/2002), CAS (s/p L CEA 2008), HTN, arthritis, and TIA. Recently admitted 12/8-12/11/14 for syncopal episode from inappropriate ATP which accelerated SVT>>VT with failed ATP and then spontaneous termination of VT. During hospital stay had repeat RHC, hemodynamics were stable and had TEE-DC-CV of AFL 10/15/13 which was successful. He was presented to our [facility] team and felt to be good LVAD candidate and then presented at [facility] transplant listing conference and they felt he was not a candidate for transplant, but good candidate for LVAD under DT criteria.   POD 23 HeartMate II LVAD   Remains on milrinone. Co-ox 64% this am so milrinone decreased to 0.125. He has felt well today. Ambulated without difficulty.  -1480 for 24 hrs. Wt 199 stable    LVAD INTERROGATION:  HeartMate II LVAD:  Flow 5.4 liters/min, speed 8600, power 5.0, PI 7.2.    Objective:    Vital Signs:   Temp:  [97.3 F (36.3 C)-98.9 F (37.2 C)] 98.3 F (36.8 C) (01/07 1600) Pulse Rate:  [70-73] 70 (01/07 1600) Resp:  [16-18] 18 (01/07 1600) SpO2:  [94 %-97 %] 97 % (01/07 1600) Weight:  [90.7 kg (199 lb 15.3 oz)] 90.7 kg (199 lb 15.3 oz) (01/07 0500) Last BM Date: 11/11/13 Mean arterial Pressure 76  Intake/Output:   Intake/Output Summary (Last 24 hours) at 11/11/13 1809 Last data filed at 11/11/13 1708  Gross per 24 hour  Intake    400 ml  Output   1600 ml  Net  -1200 ml     Physical Exam: General: Awake and alert.  HEENT: normal  Cor: Faint heart sounds , LVAD hum present.  Lungs: clear but decreased breath sounds at left base.  Abdomen: soft, nontender,  nondistended. No hepatosplenomegaly. No bruits or masses. Active bowel sounds.  Driveline: C/D/I; securement device intact and driveline incorporated  Extremities: mild peripheral edema in feet and lower legs  Neuro: alert & orientedx3, cranial nerves grossly intact. moves all 4 extremities w/o difficulty. Affect pleasant    Telemetry: A-paced at 70  Labs: Basic Metabolic Panel:  Recent Labs Lab 11/07/13 0537 11/08/13 0500 11/09/13 0500 11/10/13 0520 11/11/13 0557  NA 136* 136* 136* 135* 136*  K 3.9 3.9 3.9 3.9 3.8  CL 97 97 97 95* 96  CO2 28 30 29 30 28   GLUCOSE 89 96 90 89 98  BUN 14 16 17 19 18   CREATININE 1.24 1.37* 1.31 1.40* 1.32  CALCIUM 8.0* 8.1* 8.0* 8.1* 8.2*    Liver Function Tests:  Recent Labs Lab 11/06/13 0610 11/08/13 0500 11/09/13 0500 11/10/13 0520 11/11/13 0557  AST 29 36 39* 35 32  ALT 26 27 30 29 26   ALKPHOS 103 108 103 102 97  BILITOT 0.5 0.5 0.5 0.4 0.4  PROT 5.2* 5.3* 5.2* 5.2* 5.2*  ALBUMIN 2.1* 2.2* 2.1* 2.2* 2.2*   No results found for this basename: LIPASE, AMYLASE,  in the last 168 hours No results found for this basename: AMMONIA,  in the last 168 hours  CBC:  Recent Labs Lab 11/07/13 0537 11/08/13 0500 11/09/13 0500 11/10/13 0520 11/11/13 0557  WBC  7.1 8.0 7.2 6.9 6.8  HGB 8.6* 9.0* 8.9* 8.9* 8.8*  HCT 26.2* 27.4* 27.4* 27.5* 26.5*  MCV 92.6 93.5 94.2 94.2 92.7  PLT 364 312 290 309 303    INR:  Recent Labs Lab 11/07/13 0537 11/08/13 0500 11/09/13 0500 11/10/13 0520 11/11/13 0557  INR 2.18* 2.21* 2.22* 2.26* 2.37*   LDH: 397 stable Other results:  EKG:   Imaging: Dg Chest Port 1 View  11/11/2013   CLINICAL DATA:  Insertion of implantable left ventricular assist device.  EXAM: PORTABLE CHEST - 1 VIEW  COMPARISON:  11/08/2013  FINDINGS: Right arm PICC line tip is in the projection of the SVC. There is a left chest wall ICD with lead in the right atrial appendage and right ventricle. Left ventricular assist  device is unchanged in position from previous exam. Previous median sternotomy and CABG procedure. The heart size is enlarged. There is a small left pleural effusion. Mild interstitial edema is noted.  IMPRESSION: 1. Mild CHF.   Electronically Signed   By: Kerby Moors M.D.   On: 11/11/2013 08:21      Medications:     Scheduled Medications: . amiodarone  200 mg Oral BID  . aspirin EC  325 mg Oral Daily  . bisacodyl  10 mg Oral Daily   Or  . bisacodyl  10 mg Rectal Daily  . digoxin  0.0625 mg Oral Daily  . docusate sodium  200 mg Oral Daily  . feeding supplement (ENSURE COMPLETE)  237 mL Oral BID BM  . ferrous gluconate  324 mg Oral BID WC  . guaiFENesin  600 mg Oral BID  . midodrine  5 mg Oral TID WC  . pantoprazole  40 mg Oral Daily  . potassium chloride  20 mEq Oral Daily  . ranolazine  500 mg Oral BID  . sodium chloride  3 mL Intravenous Q12H  . torsemide  20 mg Oral Daily  . warfarin  2 mg Oral q1800  . Warfarin - Pharmacist Dosing Inpatient   Does not apply q1800     Infusions: . sodium chloride 20 mL/hr at 10/22/13 0800  . sodium chloride 10 mL/hr (11/05/13 2058)  . sodium chloride 250 mL (10/20/13 0600)  . sodium chloride 10 mL/hr (11/07/13 2150)  . sodium chloride    . milrinone 0.125 mcg/kg/min (11/11/13 0951)     PRN Medications:  acetaminophen, ondansetron (ZOFRAN) IV, sodium chloride, sodium chloride, sodium chloride, traMADol   Assessment:   1) Chronic systolic HF, Class IV, EF 20-25%  --s/p HM II VAD implant 12/15  2) NICM/ICM  3) Recurrent Vtach  - ablation 09/2011 and 06/2013  4) Atrial flutter  - s/p DC-CV 10/15/2013  5) CAD with recanalized occluded RCA with left to right collat.  6) PAD: s/p aorto-bifem, bilateral renal artery bypass, bilateral fem-pop, and left CEA.  7) Severe RV dysfunction by 2D echo 10/31/2013 8) Left pleural effusion with left lung atelectasis   Plan/Discussion:    He continues to make slow progress. He is  diuresing slowly and milrinone was weaned to 0.125 today.  Follow up Co-ox in am. He has some recurrent left pleural effusion and we will have to watch this. It may continue to progress.  I reviewed the LVAD parameters from today, and compared the results to the patient's prior recorded data.  No programming changes were made.  The LVAD is functioning within specified parameters.  The patient performs LVAD self-test daily.  LVAD interrogation was negative for  any significant power changes, alarms or PI events/speed drops.  LVAD equipment check completed and is in good working order.  Back-up equipment present.   LVAD education done on emergency procedures and precautions and reviewed exit site care.  Length of Stay: 24  Gaye Pollack 11/11/2013, 6:09 PM  VAD Team Pager [phone] (7am - 7am)

## 2013-11-11 NOTE — Progress Notes (Signed)
Changed drive line dressing, following correct sterile procedure. Skin slightly red around area where tape was. No skin tears noted. Very minumum drainage noted. Drainage brown colored. Dressing signed and dated. Elmarie Shiley R

## 2013-11-11 NOTE — Progress Notes (Signed)
ANTICOAGULATION CONSULT NOTE - Follow Up Consult  Pharmacy Consult for Coumadin Indication: atrial fibrillation / s/p LVAD 12/15  Allergies  Allergen Reactions  . Demerol [Meperidine]     Paralysis. Could only move eyes.  . Meperidine Hcl Other (See Comments)    "CAN'T MOVE" CATATONIC PER PT.  [name] Opium Other (See Comments)    "CAN'T MOVE" CATATONIC PER PT.    Patient Measurements: Height: 6' (182.9 cm) Weight: 199 lb 15.3 oz (90.7 kg) IBW/kg (Calculated) : 77.6  Vital Signs: Temp: 97.3 F (36.3 C) (01/07 1215) Temp src: Oral (01/07 1215) Pulse Rate: 70 (01/07 1215)  Labs:  Recent Labs  11/09/13 0500 11/10/13 0520 11/11/13 0557  HGB 8.9* 8.9* 8.8*  HCT 27.4* 27.5* 26.5*  PLT 290 309 303  LABPROT 23.9* 24.2* 25.1*  INR 2.22* 2.26* 2.37*  CREATININE 1.31 1.40* 1.32    Estimated Creatinine Clearance: 53.9 ml/min (by C-G formula based on Cr of 1.32).   Medical History: Past Medical History  Diagnosis Date  . CAD (coronary artery disease)     s/p MI 1982;  s/p DES to CFX 2011;  s/p NSTEMI 4/12 in setting of VTach;  cath 7/12:  LM ok, LAD mid 30-40%, Dx's with 40%, mCFX stent patent, prox to mid RCA occluded with R to R and L to R collats.  Medical Tx was continued  . Bradycardia   . Ventricular tachycardia     s/p AICD;   h/o ICD shock;  Amiodarone Rx. S/P ablation in 2012  . 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 HTN (hypertension)   . HLD (hyperlipidemia)   . Cytopenia   . Hypothyroidism   . Renal insufficiency   . Claudication   . Chronic systolic heart failure   . Ischemic cardiomyopathy     echo 7/12:  EF 25-30%, mild AI, mod MR, mod LAE  . TIA (transient ischemic attack) 2001  . CVD (cerebrovascular disease)     left CEA 2008  . Stroke   . Anemia   . Inappropriate therapy from implantable cardioverter-defibrillator  04/02/2013    ATP for sinus tach  SVT wavelet identified SVT but was no  passive   . Myocardial infarction 1992  . Anginal pain   . Automatic implantable cardioverter-defibrillator in situ     Medtronic Evera device serial number [id number]    . Sleep apnea     denies wearing mask (05/25/2013)  . Arthritis     "a little in my knees" (05/25/2013)  . Melanoma of ear 2013    "near left ear" (05/25/2013)    Assessment: 73 yo M with HF s/p LVAD placement 12/15.  Afib > SR 70s s/p DCCV 12/31. Current tx amiodarone, ranolazine, digoxin.   INR 2.37 today, cbc low stable watch, no bleeding noted, on iron supplement bid.  Goal of Therapy:  INR 2-3 Monitor platelets by anticoagulation protocol: Yes   Plan:  Continue Coumadin 2mg  daily Daily INR  Uvaldo Rising, BCPS  Clinical Pharmacist Pager [phone]  11/11/2013 12:58 PM

## 2013-11-11 NOTE — Progress Notes (Signed)
Pt's home VAD equipment delivered and set up in room. Home discharge teaching scheduled for tomorrow at 9 am with VAD coordinator, patient, [name], and friend (retired Marine scientist) Pamala Hurry at 9 am. Patient and [name] confirmed they have reviewed contents of  home discharge binder including HM II Patient Handbook.

## 2013-11-11 NOTE — Progress Notes (Addendum)
Physical Therapy Treatment Patient Details Name: CHRISTOPHE RISING MRN: [id number] DOB: Mar 31, 1941 Today's Date: 11/11/2013 Time: 8657-8469 PT Time Calculation (min): 40 min  PT Assessment / Plan / Recommendation  History of Present Illness Pt admit for LVAD placement. Has been symptomatic with drops in PI (afib with aberrancy; not vtach) and treated for Rt BPPV successfully.   PT Comments   Pt again with drop in PI with initial standing (down to 2.2 with begin to recover slowly after 30 sec). Pt asymptomatic. Majority of time walking ranged 3.5-4.0 and near end of walk (300 ft) incr to 6.0. Attempted activity on RA, however at rest on RA SaO2 88%. @ rest on 1 L 98%. With ambulation on 1 L could not get a reading to register; immediately on sitting pt 90% and dropped down to 84% and recovered to 90% in 45 seconds.    Follow Up Recommendations  Home health PT;Supervision/Assistance - 24 hour     Does the patient have the potential to tolerate intense rehabilitation     Barriers to Discharge        Equipment Recommendations  Other (comment) (4 wheeled walker) Pt continues to have issues with PI with activity and SaO2 also continue to vary. Pt will be safer with rollator to assure he has a safe place to sit if he begins to feel poorly. Also provides a place for all his back-up equipment while mobilizing.   Recommendations for Other Services    Frequency Min 5X/week   Progress towards PT Goals Progress towards PT goals: Progressing toward goals  Plan Current plan remains appropriate;Equipment recommendations need to be updated    Precautions / Restrictions Precautions Precautions: Sternal;Fall Precaution Comments: LVAD; closely monitor PI as dropping when changing positions Required Braces or Orthoses: Other Brace/Splint Other Brace/Splint: strap for controller; vest for batteries; needs to take bag with back up supplies with him whenever he leaves his room Restrictions Weight Bearing  Restrictions: No Other Position/Activity Restrictions: sternal precautions   Pertinent Vitals/Pain See PT comments above. Asymptomatic despite decreases with activity    Mobility  Transfers Stand to Sit: 4: Min  pt using hands on knees and requires assist for ant wt-shift over BOS and slight upward assist; good eccentric control with stand to sit; repeated x 5; pt reports Rt knee pain is also limiiting; discussed ways to build up sitting surface at home (pt states [name] used to own a Surveyor, mining and can obtain dense foam to place under chair cushion  Ambulation/Gait Ambulation Distance (Feet): 300 Feet  Min guard assist with 4 wheeled walker   Exercises Other Exercises Other Exercises: Patient able to ask for all equipment needed to switch to batteries.  Able to make switch independently.   Asked for bag to take with [place] for ambulation.  Patient able to list supplies needed in bag.  Patient able to switch off of batteries at end of session. Other Exercises: Pt able to state need to closely monitor PI due to change in pump speed since his morning walk. Pt independently monitoring PI via controller while ambulating   PT Diagnosis:    PT Problem List:   PT Treatment Interventions:     PT Goals (current goals can now be found in the care plan section)    Visit Information  Last PT Received On: 11/11/13 Assistance Needed: +1 History of Present Illness: Pt admit for LVAD placement. Has been symptomatic with drops in PI (afib with aberrancy; not vtach) and treated for  Rt BPPV successfully.    Subjective Data  Subjective: Reports he does want a rollator   Cognition  Cognition Arousal/Alertness: Awake/alert Behavior During Therapy: WFL for tasks assessed/performed Overall Cognitive Status: Within Functional Limits for tasks assessed    Balance  Static Standing Balance Rhomberg - Eyes Opened: 30 Rhomberg - Eyes Closed: 5 (repeated trials improved to 30 sec with decr sway)  End of  Session PT - End of Session Equipment Utilized During Treatment: Oxygen;Gait belt Activity Tolerance: Patient tolerated treatment well Patient left: in chair;with call bell/phone within reach Nurse Communication: Mobility status;Other (comment) (Dr Royann Shivers in and rec no push to stand x 8 wks)   GP     Kimble Hitchens 11/11/2013, 9:07 AM Pager [phone]

## 2013-11-11 NOTE — Progress Notes (Addendum)
CARDIAC REHAB PHASE I   PRE:  Rate/Rhythm: 70 paced    BP: sitting 78    SaO2: 96 2 1/4 L, 87 RA  MODE:  Ambulation: 350 ft   POST:  Rate/Rhythm: 83 SR    BP: sitting 82      SaO2: 95 2L  SATURATION QUALIFICATIONS: (This note is used to comply with regulatory documentation for home oxygen)  Patient Saturations on Room Air at Rest = 87%  Patient Saturations on Room Air while Ambulating = n/a%  Patient Saturations on 2 Liters of oxygen while Ambulating = 95%  Please briefly explain why patient needs home oxygen: Pt at rest 87 RA. Reapplied O2 before walk. Able to increase distance today. X3 short rests, long enough to check PI which was 4.5-5.6 walking. Return to recliner. MAP stable. Will f/u tomorrow.  [id number]   Josephina Shih [place] CES, ACSM 11/11/2013 12:12 PM

## 2013-11-12 DIAGNOSIS — I5023 Acute on chronic systolic (congestive) heart failure: Secondary | ICD-10-CM

## 2013-11-12 DIAGNOSIS — Z95818 Presence of other cardiac implants and grafts: Secondary | ICD-10-CM

## 2013-11-12 LAB — CARBOXYHEMOGLOBIN
Carboxyhemoglobin: 2.1 % — ABNORMAL HIGH (ref 0.5–1.5)
Carboxyhemoglobin: 1.9 % — ABNORMAL HIGH (ref 0.5–1.5)
Methemoglobin: 0.8 % (ref 0.0–1.5)
Methemoglobin: 0.8 % (ref 0.0–1.5)
O2 Saturation: 52.4 %
O2 Saturation: 48.3 %
Total hemoglobin: 8.1 g/dL — ABNORMAL LOW (ref 13.5–18.0)
Total hemoglobin: 8.4 g/dL — ABNORMAL LOW (ref 13.5–18.0)

## 2013-11-12 LAB — COMPREHENSIVE METABOLIC PANEL
ALK PHOS: 93 U/L (ref 39–117)
ALT: 23 U/L (ref 0–53)
AST: 28 U/L (ref 0–37)
Albumin: 2.2 g/dL — ABNORMAL LOW (ref 3.5–5.2)
BILIRUBIN TOTAL: 0.3 mg/dL (ref 0.3–1.2)
BUN: 20 mg/dL (ref 6–23)
CHLORIDE: 98 meq/L (ref 96–112)
CO2: 29 mEq/L (ref 19–32)
CREATININE: 1.33 mg/dL (ref 0.50–1.35)
Calcium: 8.1 mg/dL — ABNORMAL LOW (ref 8.4–10.5)
GFR calc non Af Amer: 51 mL/min — ABNORMAL LOW (ref >90)
GFR, EST AFRICAN AMERICAN: 59 mL/min — AB (ref >90)
GLUCOSE: 93 mg/dL (ref 70–99)
POTASSIUM: 4 meq/L (ref 3.7–5.3)
Sodium: 138 mEq/L (ref 137–147)
Total Protein: 5.1 g/dL — ABNORMAL LOW (ref 6.0–8.3)

## 2013-11-12 LAB — CBC
HEMATOCRIT: 25.8 % — AB (ref 39.0–52.0)
Hemoglobin: 8.1 g/dL — ABNORMAL LOW (ref 13.0–17.0)
MCH: 29.5 pg (ref 26.0–34.0)
MCHC: 31.4 g/dL (ref 30.0–36.0)
MCV: 93.8 fL (ref 78.0–100.0)
PLATELETS: 279 10*3/uL (ref 150–400)
RBC: 2.75 MIL/uL — ABNORMAL LOW (ref 4.22–5.81)
RDW: 17.6 % — ABNORMAL HIGH (ref 11.5–15.5)
WBC: 7.3 10*3/uL (ref 4.0–10.5)

## 2013-11-12 LAB — BODY FLUID CULTURE: Culture: NO GROWTH

## 2013-11-12 LAB — LACTATE DEHYDROGENASE: LDH: 370 U/L — AB (ref 94–250)

## 2013-11-12 LAB — PROTIME-INR
INR: 2.52 — ABNORMAL HIGH (ref 0.00–1.49)
Prothrombin Time: 26.3 seconds — ABNORMAL HIGH (ref 11.6–15.2)

## 2013-11-12 LAB — URIC ACID: URIC ACID, SERUM: 3.2 mg/dL — AB (ref 4.0–7.8)

## 2013-11-12 MED ORDER — DICLOFENAC SODIUM 1 % TD GEL
2.0000 g | Freq: Four times a day (QID) | TRANSDERMAL | Status: DC | PRN
  Filled 2013-11-12: qty 100

## 2013-11-12 MED ORDER — FERROUS GLUCONATE 324 (38 FE) MG PO TABS
324.0000 mg | ORAL_TABLET | Freq: Three times a day (TID) | ORAL | Status: DC
  Administered 2013-11-12 – 2013-11-18 (×17): 324 mg via ORAL
  Filled 2013-11-12 (×20): qty 1

## 2013-11-12 NOTE — Progress Notes (Signed)
ANTICOAGULATION CONSULT NOTE - Follow Up Consult  Pharmacy Consult for Coumadin Indication: atrial fibrillation / s/p LVAD 12/15  Allergies  Allergen Reactions  . Demerol [Meperidine]     Paralysis. Could only move eyes.  . Meperidine Hcl Other (See Comments)    "CAN'T MOVE" CATATONIC PER PT.  [name] Opium Other (See Comments)    "CAN'T MOVE" CATATONIC PER PT.    Patient Measurements: Height: 6' (182.9 cm) Weight: 198 lb 8 oz (90.039 kg) IBW/kg (Calculated) : 77.6  Vital Signs: Temp: 98.3 F (36.8 C) (01/08 1200) Temp src: Oral (01/08 1200) Pulse Rate: 71 (01/08 1203)  Labs:  Recent Labs  11/10/13 0520 11/11/13 0557 11/12/13 0535  HGB 8.9* 8.8* 8.1*  HCT 27.5* 26.5* 25.8*  PLT 309 303 279  LABPROT 24.2* 25.1* 26.3*  INR 2.26* 2.37* 2.52*  CREATININE 1.40* 1.32 1.33    Estimated Creatinine Clearance: 53.5 ml/min (by C-G formula based on Cr of 1.33).   Medical History: Past Medical History  Diagnosis Date  . CAD (coronary artery disease)     s/p MI 1982;  s/p DES to CFX 2011;  s/p NSTEMI 4/12 in setting of VTach;  cath 7/12:  LM ok, LAD mid 30-40%, Dx's with 40%, mCFX stent patent, prox to mid RCA occluded with R to R and L to R collats.  Medical Tx was continued  . Bradycardia   . Ventricular tachycardia     s/p AICD;   h/o ICD shock;  Amiodarone Rx. S/P ablation in 2012  . 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 HTN (hypertension)   . HLD (hyperlipidemia)   . Cytopenia   . Hypothyroidism   . Renal insufficiency   . Claudication   . Chronic systolic heart failure   . Ischemic cardiomyopathy     echo 7/12:  EF 25-30%, mild AI, mod MR, mod LAE  . TIA (transient ischemic attack) 2001  . CVD (cerebrovascular disease)     left CEA 2008  . Stroke   . Anemia   . Inappropriate therapy from implantable cardioverter-defibrillator  04/02/2013    ATP for sinus tach  SVT wavelet identified SVT but was no  passive   . Myocardial infarction 1992  . Anginal pain   . Automatic implantable cardioverter-defibrillator in situ     Medtronic Evera device serial number [id number]    . Sleep apnea     denies wearing mask (05/25/2013)  . Arthritis     "a little in my knees" (05/25/2013)  . Melanoma of ear 2013    "near left ear" (05/25/2013)    Assessment: 73 yo M with HF s/p LVAD placement 12/15.  Afib > SR 70s s/p DCCV 12/31. Current tx amiodarone, ranolazine, digoxin.   INR 2.52 today, cbc low stable watch, no bleeding noted, on iron supplement bid - increasing to TID today.  Goal of Therapy:  INR 2-3 Monitor platelets by anticoagulation protocol: Yes   Plan:  Continue Coumadin 2mg  daily Daily INR  Uvaldo Rising, BCPS  Clinical Pharmacist Pager [phone]  11/12/2013 2:13 PM

## 2013-11-12 NOTE — Progress Notes (Signed)
The Chaplain offered emotional and spiritual support to the patient today. The patient was not very responsive to the Chaplain in their time together. The patient was sitting in his bed and giving one word answers to the Chaplain's questions. The Chaplain did not stay to long with the patient and was sensing that the patient needed some alone time, so the Chaplain exited the room.  Chaplain Clista Bernhardt Rayshawn Visconti

## 2013-11-12 NOTE — Progress Notes (Signed)
Patient ID: [name], male   DOB: December 30, 1940, 73 y.o.   MRN: [id number]  HeartMate 2 Rounding Note  Subjective:    [name] is a 73 yo man with an ischemic CM s/p ICD implant, chronic systolic HF (EF 41-96%), paroxysmal VT s/p ablation (09/2011) and repeat (06/2013), CAD, PVD (s/p R fem pop BPG 01/2010; s/p L CFA BPG 05/2002), AAA (s/p repair 04/2002), CAS (s/p L CEA 2008), HTN, arthritis, and TIA. Recently admitted 12/8-12/11/14 for syncopal episode from inappropriate ATP which accelerated SVT>>VT with failed ATP and then spontaneous termination of VT. During hospital stay had repeat RHC, hemodynamics were stable and had TEE-DC-CV of AFL 10/15/13 which was successful. He was presented to our [facility] team and felt to be good LVAD candidate and then presented at [facility] transplant listing conference and they felt he was not a candidate for transplant, but good candidate for LVAD under DT criteria.   POD 24 HeartMate II LVAD   He was on Milrinone 0.125 this am and Co-ox was 52.4. This afternoon it dropped to 48.3 and he had a large drop in PI from 7 to 1.9 when starting to walk by the time he got to the end of his bed. He denied any dyspnea. Said his right leg was numb. His Milrinone was increased to 0.25 and his pacer rate was increased to 75. He says he feels fine now sitting in chair and eating dinner.  -500 cc for 24 hrs. Wt down to 198.  LVAD INTERROGATION:  HeartMate II LVAD:  Flow 5.2 liters/min, speed 8600, power 5, PI 7.9.     Objective:    Vital Signs:   Temp:  [97.7 F (36.5 C)-98.5 F (36.9 C)] 98.3 F (36.8 C) (01/08 1500) Pulse Rate:  [70-75] 75 (01/08 1500) Resp:  [18] 18 (01/08 1500) SpO2:  [88 %-99 %] 93 % (01/08 1500) Weight:  [90.039 kg (198 lb 8 oz)] 90.039 kg (198 lb 8 oz) (01/08 0500) Last BM Date: 11/11/13 Mean arterial Pressure 84  Intake/Output:   Intake/Output Summary (Last 24 hours) at 11/12/13 1726 Last data filed at 11/12/13 1700  Gross per 24 hour   Intake    640 ml  Output      0 ml  Net    640 ml     Physical Exam: General: Awake and alert.  HEENT: normal  Cor: Faint heart sounds , LVAD hum present.  Lungs: clear but decreased breath sounds at left base.  Abdomen: soft, nontender, nondistended. No hepatosplenomegaly. No bruits or masses. Active bowel sounds.  Driveline: C/D/I; securement device intact and driveline incorporated  Extremities: mild peripheral edema in feet and lower legs  Neuro: alert & orientedx3, cranial nerves grossly intact. moves all 4 extremities w/o difficulty. Affect pleasant      Telemetry: A-paced 75  Labs: Basic Metabolic Panel:  Recent Labs Lab 11/08/13 0500 11/09/13 0500 11/10/13 0520 11/11/13 0557 11/12/13 0535  NA 136* 136* 135* 136* 138  K 3.9 3.9 3.9 3.8 4.0  CL 97 97 95* 96 98  CO2 30 29 30 28 29   GLUCOSE 96 90 89 98 93  BUN 16 17 19 18 20   CREATININE 1.37* 1.31 1.40* 1.32 1.33  CALCIUM 8.1* 8.0* 8.1* 8.2* 8.1*    Liver Function Tests:  Recent Labs Lab 11/08/13 0500 11/09/13 0500 11/10/13 0520 11/11/13 0557 11/12/13 0535  AST 36 39* 35 32 28  ALT 27 30 29 26 23   ALKPHOS 108 103 102 97 93  BILITOT 0.5 0.5 0.4 0.4 0.3  PROT 5.3* 5.2* 5.2* 5.2* 5.1*  ALBUMIN 2.2* 2.1* 2.2* 2.2* 2.2*   No results found for this basename: LIPASE, AMYLASE,  in the last 168 hours No results found for this basename: AMMONIA,  in the last 168 hours  CBC:  Recent Labs Lab 11/08/13 0500 11/09/13 0500 11/10/13 0520 11/11/13 0557 11/12/13 0535  WBC 8.0 7.2 6.9 6.8 7.3  HGB 9.0* 8.9* 8.9* 8.8* 8.1*  HCT 27.4* 27.4* 27.5* 26.5* 25.8*  MCV 93.5 94.2 94.2 92.7 93.8  PLT 312 290 309 303 279    INR:  Recent Labs Lab 11/08/13 0500 11/09/13 0500 11/10/13 0520 11/11/13 0557 11/12/13 0535  INR 2.21* 2.22* 2.26* 2.37* 2.52*    Other results:  EKG:   Imaging: Dg Chest Port 1 View  11/11/2013   CLINICAL DATA:  Insertion of implantable left ventricular assist device.  EXAM:  PORTABLE CHEST - 1 VIEW  COMPARISON:  11/08/2013  FINDINGS: Right arm PICC line tip is in the projection of the SVC. There is a left chest wall ICD with lead in the right atrial appendage and right ventricle. Left ventricular assist device is unchanged in position from previous exam. Previous median sternotomy and CABG procedure. The heart size is enlarged. There is a small left pleural effusion. Mild interstitial edema is noted.  IMPRESSION: 1. Mild CHF.   Electronically Signed   By: Kerby Moors M.D.   On: 11/11/2013 08:21      Medications:     Scheduled Medications: . amiodarone  200 mg Oral BID  . aspirin EC  325 mg Oral Daily  . bisacodyl  10 mg Oral Daily   Or  . bisacodyl  10 mg Rectal Daily  . digoxin  0.0625 mg Oral Daily  . docusate sodium  200 mg Oral Daily  . feeding supplement (ENSURE COMPLETE)  237 mL Oral BID BM  . ferrous gluconate  324 mg Oral TID WC  . guaiFENesin  600 mg Oral BID  . midodrine  5 mg Oral TID WC  . pantoprazole  40 mg Oral Daily  . potassium chloride  20 mEq Oral Daily  . ranolazine  500 mg Oral BID  . sodium chloride  3 mL Intravenous Q12H  . torsemide  20 mg Oral Daily  . warfarin  2 mg Oral q1800  . Warfarin - Pharmacist Dosing Inpatient   Does not apply q1800     Infusions: . sodium chloride 20 mL/hr at 10/22/13 0800  . sodium chloride 10 mL/hr (11/05/13 2058)  . sodium chloride 250 mL (10/20/13 0600)  . sodium chloride 20 mL/hr (11/12/13 0449)  . sodium chloride    . milrinone 0.25 mcg/kg/min (11/12/13 1445)     PRN Medications:  acetaminophen, diclofenac sodium, ondansetron (ZOFRAN) IV, sodium chloride, sodium chloride, sodium chloride, traMADol   Assessment:   1) Chronic systolic HF, Class IV, EF 20-25%  --s/p HM II VAD implant 12/15  2) NICM/ICM  3) Recurrent Vtach  - ablation 09/2011 and 06/2013  4) Atrial flutter  - s/p DC-CV 10/15/2013  5) CAD with recanalized occluded RCA with left to right collat.  6) PAD: s/p  aorto-bifem, bilateral renal artery bypass, bilateral fem-pop, and left CEA.  7) Severe RV dysfunction by 2D echo 10/31/2013 8) Left pleural effusion with left lung atelectasis     Plan/Discussion:    He continues to be Milrinone-dependent with significant drop in Co-ox on Milrinone 0.125 associated with drop in PI  with minimal activity today. He seems stalled at this point I think a right heart cath is indicated to see where we stand. His Hgb is a little low and transfusion may help some. He may also benefit from further increase in his pacer rate to 80.   I reviewed the LVAD parameters from today, and compared the results to the patient's prior recorded data.  No programming changes were made.  The LVAD is functioning within specified parameters.  The patient performs LVAD self-test daily.  LVAD interrogation was negative for any significant power changes, alarms or PI events/speed drops.  LVAD equipment check completed and is in good working order.  Back-up equipment present.   LVAD education done on emergency procedures and precautions and reviewed exit site care.  Length of Stay: 25  Gaye Pollack 11/12/2013, 5:26 PM  VAD Team Pager [phone] (7am - 7am)

## 2013-11-12 NOTE — Progress Notes (Signed)
Physical Therapy Treatment Patient Details Name: [name] MRN: [id number] DOB: 29-Apr-1941 Today's Date: 11/12/2013 Time: 6295-2841 PT Time Calculation (min): 47 min  PT Assessment / Plan / Recommendation  History of Present Illness Pt admit for LVAD placement. Has been symptomatic with drops in PI (afib with aberrancy; not vtach) and treated for Rt BPPV successfully.   PT Comments   Pt again limited by dropping PI with activity and feeling dizzy with weakness in his legs.     PI seated  6.1    PI initial standing 2.2, after 90 seconds up to 2.4 (pt asymptomatic)    PI after walked 7 feet 1.9 with pt symptomatic and sat down    PI after sitting 60 seconds 2.4 and pt returned to his recliner (from rollator) at his request    PI after sitting 3 minutes 7.4 Pt reports he is to get a unit of blood today.    Follow Up Recommendations  Home health PT;Supervision/Assistance - 24 hour     Does the patient have the potential to tolerate intense rehabilitation     Barriers to Discharge        Equipment Recommendations  Other (comment) (rollator)    Recommendations for Other Services    Frequency Min 5X/week   Progress towards PT Goals Progress towards PT goals: Progressing toward goals  Plan Current plan remains appropriate;Equipment recommendations need to be updated    Precautions / Restrictions Precautions Precautions: Sternal;Fall Precaution Comments: LVAD; closely monitor PI as dropping when changing positions Required Braces or Orthoses: Other Brace/Splint Other Brace/Splint: strap for controller; vest for batteries; needs to take bag with back up supplies with him whenever he leaves his room Restrictions Weight Bearing Restrictions: No Other Position/Activity Restrictions: sternal precautions   Pertinent Vitals/Pain See above for PI numbers SaO2 on RA 94% (seated); with standing/walking unable to get a reading; 1L O2 applied and after returned to sitting, SaO2 95% and  pt returned to RA (as he was on arrival)    Mobility  Transfers Overall transfer level: Needs assistance Equipment used: 4-wheeled walker Transfers: Sit to/from [facility] Sit to Stand: Min assist Stand pivot transfers: Min assist General transfer comment: x3; requires min assist to come over feet despite visual demo, vc, and use of momentum; practiced pivot to sit on rollator when pt became dizzy and weak in his legs and required assist to lock brakes and stabilize rollator as pt pivoting (pt moving impulsively due to symptoms) Ambulation/Gait Ambulation/Gait assistance: Min assist Ambulation Distance (Feet): 14 Feet (7, 7) Assistive device: 4-wheeled walker    Exercises Other Exercises Other Exercises: Patient able to ask for all equipment needed to switch to batteries.  Able to make switch independently.   Asked for bag to take with [place] for ambulation.  Patient able to list supplies needed in bag.  Patient able to switch off of batteries at end of session. Other Exercises: Pt independently monitoring PI throughout session. Able to state parameters to follow and was very aware of need to get to sitting once he became symptomatic   PT Diagnosis:    PT Problem List:   PT Treatment Interventions:     PT Goals (current goals can now be found in the care plan section)    Visit Information  Last PT Received On: 11/12/13 Assistance Needed: +1 History of Present Illness: Pt admit for LVAD placement. Has been symptomatic with drops in PI (afib with aberrancy; not vtach) and treated for Rt  BPPV successfully.    Subjective Data  Subjective: Reports [name] is working on getting a Engineer, maintenance cover due to paid for 2 wheel RW in 2011)   Cognition  Cognition Arousal/Alertness: Awake/alert Behavior During Therapy: WFL for tasks assessed/performed Overall Cognitive Status: Within Functional Limits for tasks assessed    Balance     End of Session PT - End of  Session Equipment Utilized During Treatment: Oxygen Activity Tolerance: Treatment limited secondary to medical complications (Comment) (dizziness, weak in legs) Patient left: in chair;with call bell/phone within reach Nurse Communication: Mobility status;Other (comment) (drop in PI and symptomatic)   GP     Miguelina Fore 11/12/2013, 5:05 PM Pager [phone]

## 2013-11-12 NOTE — Progress Notes (Signed)
Patient ID: [name], male   DOB: 10-11-1941, 73 y.o.   MRN: [id number]  HeartMate 2 Rounding Note  Subjective:    [name] is a 72 yo man with an ischemic CM s/p ICD implant, chronic systolic HF (EF 21-30%), paroxysmal VT s/p ablation (09/2011) and repeat (06/2013), CAD, PVD (s/p R fem pop BPG 01/2010; s/p L CFA BPG 05/2002), AAA (s/p repair 04/2002), CAS (s/p L CEA 2008), HTN, arthritis, and TIA. Recently admitted 12/8-12/11/14 for syncopal episode from inappropriate ATP which accelerated SVT>>VT with failed ATP and then spontaneous termination of VT. During hospital stay had repeat RHC, hemodynamics were stable and had TEE-DC-CV of AFL 10/15/13 which was successful. He was presented to our [facility] team and felt to be good LVAD candidate and then presented at [facility] transplant listing conference and they felt he was not a candidate for transplant, but good candidate for LVAD under DT criteria.   Echo 12/27: LV is small with EF 15%. RV not significantly dilated but severely HK.  12/30: Milrinone begun with decreased Coox  12/31: TEE-guided DCCV back to NSR.   Remains on milrinone 0.125 and torsemide 20 daily. Continues to diurese slowly. VAD remains at [place] with several PI events noted over last 24 hours. Feeling well.  Ambulating, walked 650 feet yesterday, limited by right knee pain.  Remains a-paced 70. MAPs 72 - 84, remains on midodrine 5 mg tid. Co-ox 52% this am, repeated this afternoon 48% on Milrinone 0.125 mcg  RA O2sat 87% at rest; 95% on 2 liters during ambulation  LDH 381->417>410>387>370 Co-ox 55%->53%->56%->60%>62%>59>64%>52%   LVAD INTERROGATION:  HeartMate II LVAD:  Flow 4.8  liters/min, speed 8600, power 4.7,  PI 7.3 Alarms: none Events: 14 PI events       Objective:    Vital Signs:   Temp:  [97.7 F (36.5 C)-98.5 F (36.9 C)] 98.3 F (36.8 C) (01/08 1500) Pulse Rate:  [70-75] 75 (01/08 1500) Resp:  [18] 18 (01/08 1500) SpO2:  [88 %-99 %] 93 % (01/08  1500) Weight:  [90.039 kg (198 lb 8 oz)] 90.039 kg (198 lb 8 oz) (01/08 0500) Last BM Date: 11/11/13  Intake/Output:   Intake/Output Summary (Last 24 hours) at 11/12/13 1749 Last data filed at 11/12/13 1700  Gross per 24 hour  Intake    640 ml  Output      0 ml  Net    640 ml     Physical Exam: General: Sitting in chair. NAD HEENT: normal  Neck: supple. JVP 7 Carotids 2+ bilat; no bruits. No lymphadenopathy or thryomegaly appreciated.  Cor: Faint heart sounds , LVAD hum present.  Lungs: CTA Abdomen: soft, nontender, nondistended. No hepatosplenomegaly. No bruits or masses. Active bowel sounds.  Driveline: C/D/I; securement device intact and driveline incorporated  Extremities: warm. TED hose in place; trace edema' RUE PICC 2L Neuro: alert & orientedx3, cranial nerves grossly intact. moves all 4 extremities w/o difficulty. Affect pleasant   Telemetry: a-paced, 70  Labs: Basic Metabolic Panel:  Recent Labs Lab 11/08/13 0500 11/09/13 0500 11/10/13 0520 11/11/13 0557 11/12/13 0535  NA 136* 136* 135* 136* 138  K 3.9 3.9 3.9 3.8 4.0  CL 97 97 95* 96 98  CO2 30 29 30 28 29   GLUCOSE 96 90 89 98 93  BUN 16 17 19 18 20   CREATININE 1.37* 1.31 1.40* 1.32 1.33  CALCIUM 8.1* 8.0* 8.1* 8.2* 8.1*    Liver Function Tests:  Recent Labs Lab 11/08/13 0500 11/09/13 0500  11/10/13 0520 11/11/13 0557 11/12/13 0535  AST 36 39* 35 32 28  ALT 27 30 29 26 23   ALKPHOS 108 103 102 97 93  BILITOT 0.5 0.5 0.4 0.4 0.3  PROT 5.3* 5.2* 5.2* 5.2* 5.1*  ALBUMIN 2.2* 2.1* 2.2* 2.2* 2.2*   No results found for this basename: LIPASE, AMYLASE,  in the last 168 hours No results found for this basename: AMMONIA,  in the last 168 hours  CBC:  Recent Labs Lab 11/08/13 0500 11/09/13 0500 11/10/13 0520 11/11/13 0557 11/12/13 0535  WBC 8.0 7.2 6.9 6.8 7.3  HGB 9.0* 8.9* 8.9* 8.8* 8.1*  HCT 27.4* 27.4* 27.5* 26.5* 25.8*  MCV 93.5 94.2 94.2 92.7 93.8  PLT 312 290 309 303 279    INR:   2.5  Recent Labs Lab 11/08/13 0500 11/09/13 0500 11/10/13 0520 11/11/13 0557 11/12/13 0535  INR 2.21* 2.22* 2.26* 2.37* 2.52*   Other results:    Imaging: Dg Chest Port 1 View  11/11/2013   CLINICAL DATA:  Insertion of implantable left ventricular assist device.  EXAM: PORTABLE CHEST - 1 VIEW  COMPARISON:  11/08/2013  FINDINGS: Right arm PICC line tip is in the projection of the SVC. There is a left chest wall ICD with lead in the right atrial appendage and right ventricle. Left ventricular assist device is unchanged in position from previous exam. Previous median sternotomy and CABG procedure. The heart size is enlarged. There is a small left pleural effusion. Mild interstitial edema is noted.  IMPRESSION: 1. Mild CHF.   Electronically Signed   By: Kerby Moors M.D.   On: 11/11/2013 08:21     Medications:     Scheduled Medications: . amiodarone  200 mg Oral BID  . aspirin EC  325 mg Oral Daily  . bisacodyl  10 mg Oral Daily   Or  . bisacodyl  10 mg Rectal Daily  . digoxin  0.0625 mg Oral Daily  . docusate sodium  200 mg Oral Daily  . feeding supplement (ENSURE COMPLETE)  237 mL Oral BID BM  . ferrous gluconate  324 mg Oral TID WC  . guaiFENesin  600 mg Oral BID  . midodrine  5 mg Oral TID WC  . pantoprazole  40 mg Oral Daily  . potassium chloride  20 mEq Oral Daily  . ranolazine  500 mg Oral BID  . sodium chloride  3 mL Intravenous Q12H  . torsemide  20 mg Oral Daily  . warfarin  2 mg Oral q1800  . Warfarin - Pharmacist Dosing Inpatient   Does not apply q1800    Infusions: . sodium chloride 20 mL/hr at 10/22/13 0800  . sodium chloride 10 mL/hr (11/05/13 2058)  . sodium chloride 250 mL (10/20/13 0600)  . sodium chloride 20 mL/hr (11/12/13 0449)  . sodium chloride    . milrinone 0.25 mcg/kg/min (11/12/13 1445)    PRN Medications: acetaminophen, diclofenac sodium, ondansetron (ZOFRAN) IV, sodium chloride, sodium chloride, sodium chloride,  traMADol   Assessment:   1) Chronic systolic HF, Class IV, EF 20-25%  --s/p HM II VAD implant 12/15  2) NICM/ICM  3) Recurrent Vtach  - ablation 09/2011 and 06/2013  4) Atrial flutter  - s/p DC-CV 10/15/2013  - Atrial fibrillation recurred post-op.  - TEE-guided DCCV 12/31 5) CAD with recanalized occluded RCA with left to right collat.  6) PAD: s/p aorto-bifem, bilateral renal artery bypass, bilateral fem-pop, and left CEA.  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  8)  Pleural effusion s/p thoracentesis 1/4    Plan/Discussion:    Feels a bit worse today. More dyspneic. Co-ox down to 52%. Repeat is 48% on milrinone 0.125. Weight remains stable at 198 lbs and 24 hr I/O - 500 cc. Will continue to slowly diurese patient with 20 mg torsemide daily. Will need home milrinone.   Maintaining SR. Continue po amiodarone and ranolazine 500 bid. Will turn up a-pacing to 75.    INR therapeutic, pharmacy dosing.  Continue to work with CR.   Will place for home O2 orders.   I reviewed the LVAD parameters from today, and compared the results to the patient's prior recorded data.  No programming changes were made.  The LVAD is functioning within specified parameters.  The patient performs LVAD self-test daily.  LVAD interrogation was negative for any significant power changes, alarms or PI events/speed drops.  LVAD equipment check completed and is in good working order.  Back-up equipment present.   LVAD education done on emergency procedures and precautions and reviewed exit site care.  Length of Stay: [place], 5:49 PM

## 2013-11-12 NOTE — Progress Notes (Signed)
CARDIAC REHAB PHASE I   PRE:  Rate/Rhythm: 70 PAcing  BP:  Supine:   Sitting: 83/  Standing:    SaO2: 95 2L 92 RA  MODE:  Ambulation: 250 ft   POST:  Rate/Rhythm: 78  BP:  Supine:   Sitting: 82/  Standing:    SaO2: 91 RA 1055-1125 On arrival pt on O2 2L, discontinued O2 sat 92% on RA. Assisted X 2 and used rolater to ambulate. Gait steady with rolater. Pt able to ambulate 250 feet with out c/o. RA sat would not register during walk. On return to room when pt sat RA sat 91%. Pt denies any SOB walking.O2 left off after walk, reported to RN. Pt required two attempts to get out of recliner to standing position.  Rodney Langton RN 11/12/2013 11:26 AM

## 2013-11-13 DIAGNOSIS — Z95818 Presence of other cardiac implants and grafts: Secondary | ICD-10-CM

## 2013-11-13 DIAGNOSIS — I5023 Acute on chronic systolic (congestive) heart failure: Secondary | ICD-10-CM

## 2013-11-13 LAB — CBC
HCT: 22.4 % — ABNORMAL LOW (ref 39.0–52.0)
Hemoglobin: 7.3 g/dL — ABNORMAL LOW (ref 13.0–17.0)
MCH: 30.5 pg (ref 26.0–34.0)
MCHC: 32.6 g/dL (ref 30.0–36.0)
MCV: 93.7 fL (ref 78.0–100.0)
Platelets: 240 10*3/uL (ref 150–400)
RBC: 2.39 MIL/uL — AB (ref 4.22–5.81)
RDW: 17.7 % — ABNORMAL HIGH (ref 11.5–15.5)
WBC: 6.9 10*3/uL (ref 4.0–10.5)

## 2013-11-13 LAB — PROTIME-INR
INR: 2.48 — ABNORMAL HIGH (ref 0.00–1.49)
Prothrombin Time: 26 seconds — ABNORMAL HIGH (ref 11.6–15.2)

## 2013-11-13 LAB — LACTATE DEHYDROGENASE: LDH: 370 U/L — ABNORMAL HIGH (ref 94–250)

## 2013-11-13 LAB — COMPREHENSIVE METABOLIC PANEL
ALBUMIN: 2 g/dL — AB (ref 3.5–5.2)
ALT: 19 U/L (ref 0–53)
AST: 23 U/L (ref 0–37)
Alkaline Phosphatase: 86 U/L (ref 39–117)
BUN: 23 mg/dL (ref 6–23)
CALCIUM: 7.8 mg/dL — AB (ref 8.4–10.5)
CO2: 29 meq/L (ref 19–32)
CREATININE: 1.34 mg/dL (ref 0.50–1.35)
Chloride: 100 mEq/L (ref 96–112)
GFR calc Af Amer: 59 mL/min — ABNORMAL LOW (ref >90)
GFR calc non Af Amer: 51 mL/min — ABNORMAL LOW (ref >90)
Glucose, Bld: 97 mg/dL (ref 70–99)
Potassium: 4 mEq/L (ref 3.7–5.3)
SODIUM: 137 meq/L (ref 137–147)
Total Bilirubin: 0.3 mg/dL (ref 0.3–1.2)
Total Protein: 4.6 g/dL — ABNORMAL LOW (ref 6.0–8.3)

## 2013-11-13 LAB — CARBOXYHEMOGLOBIN
CARBOXYHEMOGLOBIN: 2.3 % — AB (ref 0.5–1.5)
METHEMOGLOBIN: 0.5 % (ref 0.0–1.5)
O2 Saturation: 62.9 %
TOTAL HEMOGLOBIN: 8.7 g/dL — AB (ref 13.5–18.0)

## 2013-11-13 LAB — PREPARE RBC (CROSSMATCH)

## 2013-11-13 NOTE — Progress Notes (Signed)
At midnight pt was assessed for MAP, during which RN assessed oxygen status.  Pt was 88 on RA.  Pt was placeed on 2L [place], RN decreased to 1.5 after a few minutes and pt. Continued to sat in 90's.  Pt was left on 1.5L while sleeping. Pt is now resting in chair on RA with O2 sat back and forth between 89-90.  Placed pt on 1L O2, Pt resting in chair, with call light in reach, will share with day RN.    Nolon Nations,

## 2013-11-13 NOTE — Progress Notes (Signed)
Discharge VAD teaching completed with patient and caregivers.   The patient's family has completed a home inspection checklist, this has been reviewed and no unsafe conditions have been identified.  Family has ensured two dedicated grounded, 3-prong outlets in the bedroom for power module and Charity fundraiser.  Both patient and caregivers have been trained on the following:  ____  Overview of major warnings and cautions  ____ HM II LVAD overview of system operation  ____    Overview of system components (features and functions) ____ Changing power source ____ Overview of alerts and alarms ____ How to identify an emergency ____ How to handle an emergency including when the pump is running and when the pump has stopped  ____ Changing system controller ____ Maintain emergency contact list  The patient and caregivers have successfully demonstrated:  ____ Changing power source (from batteries to power module, power module to  batteries, and replacing batteries) ____ Perform system controller self test  ____ Perform power module self test  ____ Check and charge batteries  ____ Change system controller  A daily flow sheet with patient  weight, temperature,  flow, speed, power, and PI, along with daily self checks on system controller and power module has been reviewed with patient and caregiver during hospitalization and will be done daily at home.   The caregiver has been trained on percutaneous lead exit site care and dressing changes and has performed dressing changes during patient's hospitalization under nursing supervision. The importance of lead immobilization has been stressed to patient and caregivers using the attachment device. The caregiver has successfully demonstrated the following: ____ Cleansing ____ Dressing ____ Immobilizing lead  The following routine activities and maintenance have been reviewed with patient and caregiver and both verbalize understanding:  ____ Stressed  importance of never disconnecting power from both controller power leads at the same time, and never disconnecting both batteries at the same time, or the pump will stop ____ Plug the power module (PM) or the universal Charity fundraiser (UBC) into properly grounded (3 prong) outlets dedicated to PM use. Do NOT use adapter (cheater plug) for ungrounded outlets or multiple portable socket outlets (power strips) ____ Do not connect the PM or UBC to an outlet controlled by wall switch or the device may not work ____ The PM's internal battery (that provides limited backup power to the LVAD in the event of AC mains power failure) remains charged as long as the PM is connected to [facility] or DC power ____ The PM's internal battery provides approximately 30 minutes of emergency backup power for the LVAD ____ Transfer from PM to batteries during [facility] mains power failure. The PM has internal backup battery that will power the pump while you transfer to batteries ____ Keep a backup system controller, charged batteries, battery clips, and             flashlight near you during sleep in case of electrical power outage ____ Clean battery, battery clip, and universal battery charger contacts weekly ____ Visually inspect percutaneous lead daily ____ Check cables and connectors when changing power source  ____    Rotate batteries; keep all eight batteries charged ____ Always have backup system controller, battery clips, fully charged batteries, and spare fully charged batteries when traveling ____ Re-calibrate batteries every 70 uses; monitor battery life of 36 months or 360 uses; replace batteries at end of battery life   Identified the following changes in activities of daily living with pump:  ____ No driving for  at least six weeks and then only if doctor gives    permission to do so ____ No tub baths while pump implanted, and shower only if doctor gives    permission ____    No swimming or submersion in water while  implanted with pump ____    Keep all VAD equipment away from water or moisture ____ Keep all VAD connections clean and dry ____ No contact sports or engage in jumping activities ____ Avoid strong static electricity (touching TV/computer screens, vacuuming) ____ Never have an MRI while implanted with the pump ____ Never leave or store batteries in extremely hot or cold places (such as   trunk of your car), or the battery life will be shortened ____ Call the doctor or hospital contact person if any change in how the pump            sounds, feels, or works ____ Plan to sleep only when connected to the power module. ____    Keep a backup system controller, charged batteries, battery clips, and             flashlight near you during sleep in case of electrical power outage ____ Do not sleep on your stomach ____ Talk with doctor before any long distance travel plans  Discharge binder given to patient and include the following:  ____ Discharge instructions sheet ____ List of emergency contacts ____ Vanna [place] card ____    HM II Luggage tag ____ Guide to Percutaneous Lead Care and Equipment Maintenance ____ HM II Alarms for Patients and their Caregivers ____ HM II Patient Handbook ____ HM II Patient Education Program DVD ____ HM II Information and Emergency Assistance Guide ____ HM II Power Module Alarms, Care, Maintenance ____ Daily diary sheets ____    [facility] VAD Patient Education Booklet ____    Care of the Percutaneous Lead ____    HM II LVAS Emergency Response Checklist   Discharge equipment includes:  ____ Two system controllers with preset: Fixed speed of   8600 RPM Low speed limit   8000  RPM ____ One power module (PM) with 20' patient cable ____ One universal Charity fundraiser (UBC) ____ Eight fully charged batteries ____ Four battery clips ____ One travel case ____ One holster vest ____    Dressing supplies and attachment devices  Following notification process completed  with:    [facility]  EMS   [employer]    Discussed frequency and importance of INR checks; emphasized importance of maintaining INR goal to prevent clotting and or bleeding issues with pump.  Patient will have INR managed by our heart failure team; current INR goal  Is 2.0 - 3.0.  Based on information received from [facility] II manufacturer ([employer]) about reported difficulties that have occurred with some patients and caregivers when changing from primary to back up pocket controller, the HM II LVAD addendum document [id number] was given and reviewed with pt and caregiver.  Additional instructions were provided as follows:        1. Patient should have a caregiver present during system controller exchange or call     911 and VAD pager before attempting to change controller if alone. 2. As part of the weekly safety checklist, the patient and caregiver should review and      understand the steps and instructions involved with replacing the system controller.  3. As part of monthly safety checklist, the patient and caregiver should review and  understand the system controller alarms and how to resolve them.  4. Updated copy of HM II Guide to Replacing the Running System Controller with the              Backup Controller for Patients and Their Caregiveriven reviewed and given to the                  patient.  5. HM II Home Flowsheets given that include the weekly and monthly checks as      noted above.  The patient has completed a proficiency test for the HM II and all questions have been answered. The pt and family have been instructed to call if any questions, problems, or concerns arise. Pt and caregiver successfully paged VAD coordinator using VAD pager emergency number and have been instructed to use this number only for emergencies. Patient and caregivers asked appropriate questions, had good interaction with VAD coordinator, and verbalized understanding of above  instructions.  Driveline dressing changed; site has minimal redness under aquacel strip, no tenderness, drainage, or foul odor. Last suture removed; exit site with tissue ingrowth noted; stabilization device in place.

## 2013-11-13 NOTE — Progress Notes (Signed)
Occupational Therapy Treatment Patient Details Name: [name] MRN: [id number] DOB: [birth year] Today's Date: 11/13/2013 Time: 7124-5809 OT Time Calculation (min): 17 min  OT Assessment / Plan / Recommendation  History of present illness Pt admit for LVAD placement. Has been symptomatic with drops in PI (afib with aberrancy; not vtach) and treated for Rt BPPV successfully.   OT comments  Pt very fatigued this am.  Initially requiring mod A to move sit to stand with PI dropping from 6.6 to 2.0 without rebounding.  Pt returned to sitting with PI quickly increasing to 6.8.  Worked on sit to stand with focus on achieving adequate hip flexion and forward excursion of trunk forward - able to perform partial sit to stand x 2 with min A, but pt fatigued at that time and unable to continue.  PI 7.0 at end of session   Follow Up Recommendations  Home health OT;Supervision/Assistance - 24 hour    Barriers to Discharge       Equipment Recommendations  3 in 1 bedside comode    Recommendations for Other Services    Frequency Min 3X/week   Progress towards OT Goals Progress towards OT goals: Progressing toward goals  Plan Discharge plan remains appropriate    Precautions / Restrictions Precautions Precautions: Sternal;Fall Precaution Comments: LVAD; closely monitor PI as dropping when changing positions Required Braces or Orthoses: Other Brace/Splint Other Brace/Splint: strap for controller; vest for batteries; needs to take bag with back up supplies with him whenever he leaves his room Restrictions Weight Bearing Restrictions: No Other Position/Activity Restrictions: sternal precautions   Pertinent Vitals/Pain     ADL  Toilet Transfer: Minimal assistance;Moderate assistance Toilet Transfer Method: Sit to stand ADL Comments: Pt with complaint of fatigue.  PI 6.6 beginning of session.  Pt attempted to move sit to stand x 2, and unable without assistance.  PI dropped to 3.8, but  recovered to 6.8 quickly.  Pt initially required mod A to move sit to stand with pt complaint of feeling very weak and fatigued and PI dropping to 2.0 and not rebounding.  Pt fatigued and returned to sitting with PI quickly climbing to 6.8.   Worked on sit to stand with emphasis on adequate hip flexion and forward excursion of trunk.  Pt able to move into partial stand x 2 with min A, but pt fatigued and unable to continue.  PI at end of session 7.0    OT Diagnosis:    OT Problem List:   OT Treatment Interventions:     OT Goals(current goals can now be found in the care plan section) Acute Rehab OT Goals Patient Stated Goal: to get better OT Goal Formulation: With patient Time For Goal Achievement: 11/19/13 Potential to Achieve Goals: Good ADL Goals Pt Will Perform Grooming: with supervision;standing Pt Will Perform Upper Body Bathing: with set-up;sitting Pt Will Perform Lower Body Bathing: with supervision;sit to/from stand Pt Will Perform Upper Body Dressing: with set-up;sitting Pt Will Perform Lower Body Dressing: with supervision;sit to/from stand Pt Will Transfer to Toilet: with supervision;ambulating;bedside commode;regular height toilet Pt Will Perform Toileting - Clothing Manipulation and hygiene: with supervision;sit to/from stand Additional ADL Goal #1: Pt will be independent with managing LVAD equipment during all BADLs  Additional ADL Goal #2: Pt will be independent with sternal precautions during all BADLs   Visit Information  Last OT Received On: 11/13/13 Assistance Needed: +1 History of Present Illness: Pt admit for LVAD placement. Has been symptomatic with drops in PI (afib  with aberrancy; not vtach) and treated for Rt BPPV successfully.    Subjective Data      Prior Functioning       Cognition  Cognition Arousal/Alertness: Awake/alert Behavior During Therapy: WFL for tasks assessed/performed Overall Cognitive Status: Within Functional Limits for tasks assessed     Mobility       Exercises      Balance    End of Session OT - End of Session Equipment Utilized During Treatment: Rolling walker;Other (comment) (LVAD equipment) Activity Tolerance: Patient limited by fatigue Patient left: in chair;with call bell/phone within reach Nurse Communication: Mobility status  [place], Neela Zecca M 11/13/2013, 10:48 AM

## 2013-11-13 NOTE — Progress Notes (Signed)
Second transfusion of blood complete following blood transfusion policy.. No complication from transfusion. No complaints from patients. VS remained stable. No reaction suspected. [name] R

## 2013-11-13 NOTE — Progress Notes (Signed)
Dressing changes using sterile technique. No drainage noted, old crust. Skin slightly pink around tape [name] R

## 2013-11-13 NOTE — Progress Notes (Signed)
Pt stood to get on scale for morning weight, PI inially before standing was 5.5.  After standing and then walking to chair, PI was noticed to be 2.2. PI slowly increased and is now 7.6, 10 minutes later.   Pt was asymptomatic during PI changes and is resting in chair with call light in reach.  RN will continue to monitor and share information with day RN.    Nolon Nations, RN

## 2013-11-13 NOTE — Progress Notes (Signed)
Began first unit of blood transfusion. VS stable pre-transfusion. Blood verified along with another RN, and matched to Patient. Stayed with the patient during the first 15 minutes of the transfusion. No reaction suspected. No complaints from the patient. VS stable after first 15 minutes. Will follow blood transfusion protocol and VS Q1H. Patient being monitored closely.

## 2013-11-13 NOTE — Progress Notes (Signed)
ANTICOAGULATION CONSULT NOTE - Follow Up Consult  Pharmacy Consult for Coumadin Indication: atrial fibrillation / s/p LVAD 12/15  Allergies  Allergen Reactions  . Demerol [Meperidine]     Paralysis. Could only move eyes.  . Meperidine Hcl Other (See Comments)    "CAN'T MOVE" CATATONIC PER PT.  [name] Opium Other (See Comments)    "CAN'T MOVE" CATATONIC PER PT.    Patient Measurements: Height: 6' (182.9 cm) Weight: 197 lb 5 oz (89.5 kg) IBW/kg (Calculated) : 77.6  Vital Signs: Temp: 98 F (36.7 C) (01/09 0453) Temp src: Oral (01/09 0453) Pulse Rate: 75 (01/09 0300)  Labs:  Recent Labs  11/11/13 0557 11/12/13 0535 11/13/13 0535  HGB 8.8* 8.1* 7.3*  HCT 26.5* 25.8* 22.4*  PLT 303 279 240  LABPROT 25.1* 26.3* 26.0*  INR 2.37* 2.52* 2.48*  CREATININE 1.32 1.33 1.34    Estimated Creatinine Clearance: 53.1 ml/min (by C-G formula based on Cr of 1.34).   Medical History: Past Medical History  Diagnosis Date  . CAD (coronary artery disease)     s/p MI 1982;  s/p DES to CFX 2011;  s/p NSTEMI 4/12 in setting of VTach;  cath 7/12:  LM ok, LAD mid 30-40%, Dx's with 40%, mCFX stent patent, prox to mid RCA occluded with R to R and L to R collats.  Medical Tx was continued  . Bradycardia   . Ventricular tachycardia     s/p AICD;   h/o ICD shock;  Amiodarone Rx. S/P ablation in 2012  . 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 HTN (hypertension)   . HLD (hyperlipidemia)   . Cytopenia   . Hypothyroidism   . Renal insufficiency   . Claudication   . Chronic systolic heart failure   . Ischemic cardiomyopathy     echo 7/12:  EF 25-30%, mild AI, mod MR, mod LAE  . TIA (transient ischemic attack) 2001  . CVD (cerebrovascular disease)     left CEA 2008  . Stroke   . Anemia   . Inappropriate therapy from implantable cardioverter-defibrillator 04/02/2013     ATP for sinus tach  SVT wavelet identified SVT but was no  passive   . Myocardial infarction 1992  . Anginal pain   . Automatic implantable cardioverter-defibrillator in situ     [employer]onic Evera device serial number [id number]    . Sleep apnea     denies wearing mask (05/25/2013)  . Arthritis     "a little in my knees" (05/25/2013)  . Melanoma of ear 2013    "near left ear" (05/25/2013)    Assessment: 73 yo M with HF s/p LVAD placement 12/15.  Afib > SR 70s s/p DCCV 12/31. Current tx amiodarone, ranolazine, digoxin.   INR 2.48 today.  No bleeding noted, but Hgb continues to fall.  Getting PRBCs today.  Will check anemia panel with AM labs. On iron supplement bid - increasing to TID.  Goal of Therapy:  INR 2-3 Monitor platelets by anticoagulation protocol: Yes   Plan:  Continue Coumadin 2mg  daily Daily INR  Uvaldo Rising, BCPS  Clinical Pharmacist Pager [phone]  11/13/2013 9:26 AM

## 2013-11-13 NOTE — Progress Notes (Signed)
CSW met with patient and caregiver [name] at bedside. Patient reports that he will not be discharged today. He states that his PI has dropped when he stands to ambulate and his hemoglobin is also low. He reports that he will get units of blood and hopeful for discharge soon. Caregiver [name] stated that they were not surprised that a complication arose delaying discharge as it has happened in the past. [name] reports she is ready for him to come home but emphasized that he "must be able to ambulate to the bathroom". Both caregiver and patient continue to be positive about recovery and hopeful for discharge soon. Patient stated that he is grateful for the LVAD placement "cause without it, I would have been dead soon". He attributes his positivity to a new life opportunity. CSW continues to follow for support throughout recovery. [name], [place]

## 2013-11-13 NOTE — Progress Notes (Signed)
Patient ID: EWEL LONA, male   DOB: 08-23-1941, 73 y.o.   MRN: [id number]  HeartMate 2 Rounding Note  Subjective:    [name] is a 73 yo man with an ischemic CM s/p ICD implant, chronic systolic HF (EF 93-79%), paroxysmal VT s/p ablation (09/2011) and repeat (06/2013), CAD, PVD (s/p R fem pop BPG 01/2010; s/p L CFA BPG 05/2002), AAA (s/p repair 04/2002), CAS (s/p L CEA 2008), HTN, arthritis, and TIA. Recently admitted 12/8-12/11/14 for syncopal episode from inappropriate ATP which accelerated SVT>>VT with failed ATP and then spontaneous termination of VT. During hospital stay had repeat RHC, hemodynamics were stable and had TEE-DC-CV of AFL 10/15/13 which was successful. He was presented to our [facility] team and felt to be good LVAD candidate and then presented at [facility] transplant listing conference and they felt he was not a candidate for transplant, but good candidate for LVAD under DT criteria.   POD 24 HeartMate II LVAD   He remains on Milrinone 0.25. Co-ox 63% this am.   Hgb dropped to 7.3 this am and he says he tried to get up 3 times to walk but could not due to weak legs. Transfusion of 2 units PRBC's ordered. He has noted no blood in stools and no melena or black stool.   LVAD INTERROGATION:  HeartMate II LVAD:  Flow 5.3 liters/min, speed 8600, power 5.1, PI 6.3.   Objective:    Vital Signs:   Temp:  [97.7 F (36.5 C)-98.6 F (37 C)] 98.5 F (36.9 C) (01/09 1445) Pulse Rate:  [75] 75 (01/09 1445) Resp:  [18] 18 (01/09 1338) BP: (86-90)/(0) 89/0 mmHg (01/09 1338) SpO2:  [90 %-95 %] 95 % (01/09 1445) Weight:  [89.5 kg (197 lb 5 oz)] 89.5 kg (197 lb 5 oz) (01/09 0500) Last BM Date: 11/11/13 Mean arterial Pressure 84  Intake/Output:   Intake/Output Summary (Last 24 hours) at 11/13/13 1555 Last data filed at 11/13/13 1445  Gross per 24 hour  Intake    700 ml  Output   3100 ml  Net  -2400 ml     Physical Exam: General: Awake and alert.  HEENT: normal  Cor: Faint heart  sounds , LVAD hum present.  Lungs: clear but decreased breath sounds at left base.  Abdomen: soft, nontender, nondistended. No hepatosplenomegaly. No bruits or masses. Active bowel sounds.  Driveline: C/D/I; securement device intact and driveline incorporated  Extremities: mild peripheral edema in feet and lower legs  Neuro: alert & orientedx3, cranial nerves grossly intact. moves all 4 extremities w/o difficulty. Affect pleasant    Telemetry: a-paced 75  Labs: Basic Metabolic Panel:  Recent Labs Lab 11/09/13 0500 11/10/13 0520 11/11/13 0557 11/12/13 0535 11/13/13 0535  NA 136* 135* 136* 138 137  K 3.9 3.9 3.8 4.0 4.0  CL 97 95* 96 98 100  CO2 29 30 28 29 29   GLUCOSE 90 89 98 93 97  BUN 17 19 18 20 23   CREATININE 1.31 1.40* 1.32 1.33 1.34  CALCIUM 8.0* 8.1* 8.2* 8.1* 7.8*    Liver Function Tests:  Recent Labs Lab 11/09/13 0500 11/10/13 0520 11/11/13 0557 11/12/13 0535 11/13/13 0535  AST 39* 35 32 28 23  ALT 30 29 26 23 19   ALKPHOS 103 102 97 93 86  BILITOT 0.5 0.4 0.4 0.3 0.3  PROT 5.2* 5.2* 5.2* 5.1* 4.6*  ALBUMIN 2.1* 2.2* 2.2* 2.2* 2.0*   No results found for this basename: LIPASE, AMYLASE,  in the last 168 hours  No results found for this basename: AMMONIA,  in the last 168 hours  CBC:  Recent Labs Lab 11/09/13 0500 11/10/13 0520 11/11/13 0557 11/12/13 0535 11/13/13 0535  WBC 7.2 6.9 6.8 7.3 6.9  HGB 8.9* 8.9* 8.8* 8.1* 7.3*  HCT 27.4* 27.5* 26.5* 25.8* 22.4*  MCV 94.2 94.2 92.7 93.8 93.7  PLT 290 309 303 279 240    INR:  Recent Labs Lab 11/09/13 0500 11/10/13 0520 11/11/13 0557 11/12/13 0535 11/13/13 0535  INR 2.22* 2.26* 2.37* 2.52* 2.48*   LDH 370  Other results:  EKG:   Imaging:  No results found.   Medications:     Scheduled Medications: . amiodarone  200 mg Oral BID  . aspirin EC  325 mg Oral Daily  . bisacodyl  10 mg Oral Daily   Or  . bisacodyl  10 mg Rectal Daily  . digoxin  0.0625 mg Oral Daily  . docusate  sodium  200 mg Oral Daily  . feeding supplement (ENSURE COMPLETE)  237 mL Oral BID BM  . ferrous gluconate  324 mg Oral TID WC  . guaiFENesin  600 mg Oral BID  . midodrine  5 mg Oral TID WC  . pantoprazole  40 mg Oral Daily  . potassium chloride  20 mEq Oral Daily  . ranolazine  500 mg Oral BID  . sodium chloride  3 mL Intravenous Q12H  . torsemide  20 mg Oral Daily  . warfarin  2 mg Oral q1800  . Warfarin - Pharmacist Dosing Inpatient   Does not apply q1800     Infusions: . sodium chloride 20 mL/hr at 10/22/13 0800  . sodium chloride 10 mL/hr (11/05/13 2058)  . sodium chloride 250 mL (10/20/13 0600)  . sodium chloride 20 mL/hr (11/13/13 [id number])  . sodium chloride    . milrinone 0.25 mcg/kg/min (11/12/13 2012)     PRN Medications:  acetaminophen, diclofenac sodium, ondansetron (ZOFRAN) IV, sodium chloride, sodium chloride, sodium chloride, traMADol   Assessment:  1) Chronic systolic HF, Class IV, EF 20-25%  --s/p HM II VAD implant 12/15  2) NICM/ICM  3) Recurrent Vtach  - ablation 09/2011 and 06/2013  4) Atrial flutter  - s/p DC-CV 10/15/2013  5) CAD with recanalized occluded RCA with left to right collat.  6) PAD: s/p aorto-bifem, bilateral renal artery bypass, bilateral fem-pop, and left CEA.  7) Severe RV dysfunction by 2D echo 10/31/2013 8) Left pleural effusion with left lung atelectasis  9) Anemia     Plan/Discussion:    He continues to be Milrinone dependent and will likely need home Milrinone with a slow wean.  Symptomatic anemia: transfusing 2 units of prbc's. No visible blood loss. This may be due to chronic illness, daily labs, some low level hemolysis by pump, occult GI loss.  He continues to diurese well lon Demedex 20 daily and wt is decreasing.  Continue ambulation, IS.   I reviewed the LVAD parameters from today, and compared the results to the patient's prior recorded data.  No programming changes were made.  The LVAD is functioning within  specified parameters.  The patient performs LVAD self-test daily.  LVAD interrogation was negative for any significant power changes, alarms or PI events/speed drops.  LVAD equipment check completed and is in good working order.  Back-up equipment present.   LVAD education done on emergency procedures and precautions and reviewed exit site care.  Length of Stay: 26  Kama Cammarano K 11/13/2013, 3:55 PM  VAD Team Pager [phone] (  7am - 7am)

## 2013-11-13 NOTE — Progress Notes (Signed)
PT Cancellation Note  Patient Details Name: HARIM BI MRN: [id number] DOB: 07-Mar-1941   Cancelled Treatment:    Reason Treat Not Completed: Medical issues which prohibited therapy-Pt still receiving 2nd unit of blood. He felt so poorly with OT and attempts to get up on his feet this morning that he wants to wait until all blood received and then try. RN aware and agrees to assist him to ambulate later today.   Shante Maysonet 11/13/2013, 4:27 PM Pager [phone]

## 2013-11-13 NOTE — Progress Notes (Signed)
Came to ambulate however pt wants to wait until both units of blood are in. Sts he struggled to stand this am and would feel more comfortable if he could walk this pm. Will f/u in am. Will notify nursing to walk with pt. Yves Dill CES, ACSM 1:23 PM 11/13/2013

## 2013-11-13 NOTE — Progress Notes (Signed)
Patient ID: MEHKAI GALLO, male   DOB: 04/20/1941, 73 y.o.   MRN: [id number]  HeartMate 2 Rounding Note  Subjective:    Aaditya Letizia is a 73 yo man with an ischemic CM s/p ICD implant, chronic systolic HF (EF 27-74%), paroxysmal VT s/p ablation (09/2011) and repeat (06/2013), CAD, PVD (s/p R fem pop BPG 01/2010; s/p L CFA BPG 05/2002), AAA (s/p repair 04/2002), CAS (s/p L CEA 2008), HTN, arthritis, and TIA. Recently admitted 12/8-12/11/14 for syncopal episode from inappropriate ATP which accelerated SVT>>VT with failed ATP and then spontaneous termination of VT. During hospital stay had repeat RHC, hemodynamics were stable and had TEE-DC-CV of AFL 10/15/13 which was successful. He was presented to our [facility] team and felt to be good LVAD candidate and then presented at [facility] transplant listing conference and they felt he was not a candidate for transplant, but good candidate for LVAD under DT criteria.   Echo 12/27: LV is small with EF 15%. RV not significantly dilated but severely HK.  12/30: Milrinone begun with decreased Coox  12/31: TEE-guided DCCV back to NSR.  1/7: Co-ox 64, milrinone weaned to 0.125 1/8: Co-ox drop to 48, milrinone back up to 0.25  Milrinone increased back to 0.25 yesterday (co-ox 48%) and torsemide kept at 20 daily. Continues to diurese slowly. VAD remains at [place] with several PI events noted over last 24 hours; pt was symptomatic with attempt to walk yesterday evening. Feeling well this morning.  Ambulatng, walked 250 feet yesterday, limited by "right leg went numb" during second attempt to walk.  Remains a-paced 75. MAPs 72 - 84.  Co-ox 62.9%  RA O2sat dropped during night to 88%; O2 restarted at 1 l/min  LDH 381->417>410>387>370>370 Co-ox 55%->53%->56%->60%>62%>59>64% (milrinone turned down to 0.125)>52%>48% (mirlinone back to 0.25) >62.9%  Hgb 8.1 yesterday; 7.3 today   LVAD INTERROGATION:  HeartMate II LVAD:  Flow 5.3  liters/min, speed 8600, power 5.1,  PI  6.3 Alarms: none Events: 12 PI events       Objective:    Vital Signs:   Temp:  [98 F (36.7 C)-98.5 F (36.9 C)] 98 F (36.7 C) (01/09 0453) Pulse Rate:  [71-75] 75 (01/09 0300) Resp:  [18] 18 (01/08 1500) SpO2:  [88 %-94 %] 91 % (01/09 0300) Weight:  [89.5 kg (197 lb 5 oz)] 89.5 kg (197 lb 5 oz) (01/09 0500) Last BM Date: 11/11/13  Intake/Output:   Intake/Output Summary (Last 24 hours) at 11/13/13 0924 Last data filed at 11/13/13 1287  Gross per 24 hour  Intake    440 ml  Output   2650 ml  Net  -2210 ml     Physical Exam: General: Sitting in chair. NAD HEENT: normal  Neck: supple. JVP 7 Carotids 2+ bilat; no bruits. No lymphadenopathy or thryomegaly appreciated.  Cor: Faint heart sounds , LVAD hum present.  Lungs: CTA Abdomen: soft, nontender, nondistended. No hepatosplenomegaly. No bruits or masses. Active bowel sounds.  Driveline: C/D/I; securement device intact and driveline incorporated  Extremities: warm. TED hose in place; trace edema' RUE PICC 2L Neuro: alert & orientedx3, cranial nerves grossly intact. moves all 4 extremities w/o difficulty. Affect pleasant   Telemetry: a-paced, 75  Labs: Basic Metabolic Panel:  Recent Labs Lab 11/09/13 0500 11/10/13 0520 11/11/13 0557 11/12/13 0535 11/13/13 0535  NA 136* 135* 136* 138 137  K 3.9 3.9 3.8 4.0 4.0  CL 97 95* 96 98 100  CO2 29 30 28 29 29   GLUCOSE 90 89 98  93 97  BUN 17 19 18 20 23   CREATININE 1.31 1.40* 1.32 1.33 1.34  CALCIUM 8.0* 8.1* 8.2* 8.1* 7.8*    Liver Function Tests:  Recent Labs Lab 11/09/13 0500 11/10/13 0520 11/11/13 0557 11/12/13 0535 11/13/13 0535  AST 39* 35 32 28 23  ALT 30 29 26 23 19   ALKPHOS 103 102 97 93 86  BILITOT 0.5 0.4 0.4 0.3 0.3  PROT 5.2* 5.2* 5.2* 5.1* 4.6*  ALBUMIN 2.1* 2.2* 2.2* 2.2* 2.0*   No results found for this basename: LIPASE, AMYLASE,  in the last 168 hours No results found for this basename: AMMONIA,  in the last 168 hours  CBC:  Recent  Labs Lab 11/09/13 0500 11/10/13 0520 11/11/13 0557 11/12/13 0535 11/13/13 0535  WBC 7.2 6.9 6.8 7.3 6.9  HGB 8.9* 8.9* 8.8* 8.1* 7.3*  HCT 27.4* 27.5* 26.5* 25.8* 22.4*  MCV 94.2 94.2 92.7 93.8 93.7  PLT 290 309 303 279 240    INR:  2.5  Recent Labs Lab 11/09/13 0500 11/10/13 0520 11/11/13 0557 11/12/13 0535 11/13/13 0535  INR 2.22* 2.26* 2.37* 2.52* 2.48*   Other results:    Imaging: No results found.   Medications:     Scheduled Medications: . amiodarone  200 mg Oral BID  . aspirin EC  325 mg Oral Daily  . bisacodyl  10 mg Oral Daily   Or  . bisacodyl  10 mg Rectal Daily  . digoxin  0.0625 mg Oral Daily  . docusate sodium  200 mg Oral Daily  . feeding supplement (ENSURE COMPLETE)  237 mL Oral BID BM  . ferrous gluconate  324 mg Oral TID WC  . guaiFENesin  600 mg Oral BID  . midodrine  5 mg Oral TID WC  . pantoprazole  40 mg Oral Daily  . potassium chloride  20 mEq Oral Daily  . ranolazine  500 mg Oral BID  . sodium chloride  3 mL Intravenous Q12H  . torsemide  20 mg Oral Daily  . warfarin  2 mg Oral q1800  . Warfarin - Pharmacist Dosing Inpatient   Does not apply q1800    Infusions: . sodium chloride 20 mL/hr at 10/22/13 0800  . sodium chloride 10 mL/hr (11/05/13 2058)  . sodium chloride 250 mL (10/20/13 0600)  . sodium chloride 20 mL/hr (11/13/13 7564)  . sodium chloride    . milrinone 0.25 mcg/kg/min (11/12/13 2012)    PRN Medications: acetaminophen, diclofenac sodium, ondansetron (ZOFRAN) IV, sodium chloride, sodium chloride, sodium chloride, traMADol   Assessment:   1) Chronic systolic HF, Class IV, EF 20-25%  --s/p HM II VAD implant 12/15  2) NICM/ICM  3) Recurrent Vtach  - ablation 09/2011 and 06/2013  4) Atrial flutter  - s/p DC-CV 10/15/2013  - Atrial fibrillation recurred post-op.  - TEE-guided DCCV 12/31 5) CAD with recanalized occluded RCA with left to right collat.  6) PAD: s/p aorto-bifem, bilateral renal artery bypass,  bilateral fem-pop, and left CEA.  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  8) Pleural effusion s/p thoracentesis 1/4    Plan/Discussion:    Much improved now that he is back on higher dose of milrinone. Clearly inotrope dependent. Will need home milrinone and can try to wean slowly at home/ Will continue to slowly diurese patient with 20 mg torsemide daily.   With hgb 7.3. Will give 2 units. Check iron stores and FOBT. Can change labs to every other day starting Sunday.   Maintaining SR. Continue  po amiodarone and ranolazine 500 bid. Pacing to 75 yesterday.    INR therapeutic, pharmacy dosing.  Continue to work with CR.   Will place for home O2 orders.   I reviewed the LVAD parameters from today, and compared the results to the patient's prior recorded data.  No programming changes were made.  The LVAD is functioning within specified parameters.  The patient performs LVAD self-test daily.  LVAD interrogation was negative for any significant power changes, alarms or PI events/speed drops.  LVAD equipment check completed and is in good working order.  Back-up equipment present.   LVAD education done on emergency procedures and precautions and reviewed exit site care.  Length of Stay: [place] Vanetta Rule,MD 9:24 AM

## 2013-11-14 LAB — CBC
HEMATOCRIT: 30.1 % — AB (ref 39.0–52.0)
HEMOGLOBIN: 9.8 g/dL — AB (ref 13.0–17.0)
MCH: 29.1 pg (ref 26.0–34.0)
MCHC: 32.6 g/dL (ref 30.0–36.0)
MCV: 89.3 fL (ref 78.0–100.0)
Platelets: 249 10*3/uL (ref 150–400)
RBC: 3.37 MIL/uL — AB (ref 4.22–5.81)
RDW: 19 % — ABNORMAL HIGH (ref 11.5–15.5)
WBC: 7.8 10*3/uL (ref 4.0–10.5)

## 2013-11-14 LAB — COMPREHENSIVE METABOLIC PANEL
ALT: 20 U/L (ref 0–53)
AST: 26 U/L (ref 0–37)
Albumin: 2.1 g/dL — ABNORMAL LOW (ref 3.5–5.2)
Alkaline Phosphatase: 92 U/L (ref 39–117)
BUN: 23 mg/dL (ref 6–23)
CALCIUM: 8 mg/dL — AB (ref 8.4–10.5)
CO2: 28 mEq/L (ref 19–32)
Chloride: 100 mEq/L (ref 96–112)
Creatinine, Ser: 1.31 mg/dL (ref 0.50–1.35)
GFR calc non Af Amer: 52 mL/min — ABNORMAL LOW (ref >90)
GFR, EST AFRICAN AMERICAN: 60 mL/min — AB (ref >90)
GLUCOSE: 90 mg/dL (ref 70–99)
POTASSIUM: 4 meq/L (ref 3.7–5.3)
SODIUM: 136 meq/L — AB (ref 137–147)
TOTAL PROTEIN: 4.9 g/dL — AB (ref 6.0–8.3)
Total Bilirubin: 0.4 mg/dL (ref 0.3–1.2)

## 2013-11-14 LAB — CARBOXYHEMOGLOBIN
CARBOXYHEMOGLOBIN: 2.7 % — AB (ref 0.5–1.5)
Methemoglobin: 0.6 % (ref 0.0–1.5)
O2 SAT: 78.7 %
Total hemoglobin: 9 g/dL — ABNORMAL LOW (ref 13.5–18.0)

## 2013-11-14 LAB — FOLATE: Folate: 9 ng/mL

## 2013-11-14 LAB — RETICULOCYTES
RBC.: 3.37 MIL/uL — ABNORMAL LOW (ref 4.22–5.81)
Retic Count, Absolute: 161.8 10*3/uL (ref 19.0–186.0)
Retic Ct Pct: 4.8 % — ABNORMAL HIGH (ref 0.4–3.1)

## 2013-11-14 LAB — VITAMIN B12: VITAMIN B 12: 503 pg/mL (ref 211–911)

## 2013-11-14 LAB — IRON AND TIBC
IRON: 34 ug/dL — AB (ref 42–135)
Saturation Ratios: 17 % — ABNORMAL LOW (ref 20–55)
TIBC: 197 ug/dL — ABNORMAL LOW (ref 215–435)
UIBC: 163 ug/dL (ref 125–400)

## 2013-11-14 LAB — FERRITIN: FERRITIN: 437 ng/mL — AB (ref 22–322)

## 2013-11-14 LAB — PROTIME-INR
INR: 2.61 — AB (ref 0.00–1.49)
PROTHROMBIN TIME: 27 s — AB (ref 11.6–15.2)

## 2013-11-14 LAB — LACTATE DEHYDROGENASE: LDH: 385 U/L — ABNORMAL HIGH (ref 94–250)

## 2013-11-14 NOTE — Progress Notes (Addendum)
Patient ID: [name], male   DOB: April 30, 1941, 73 y.o.   MRN: [id number]  HeartMate 2 Rounding Note  Subjective:    [name] is a 73 yo man with an ischemic CM s/p ICD implant, chronic systolic HF (EF 73-22%), paroxysmal VT s/p ablation (09/2011) and repeat (06/2013), CAD, PVD (s/p R fem pop BPG 01/2010; s/p L CFA BPG 05/2002), AAA (s/p repair 04/2002), CAS (s/p L CEA 2008), HTN, arthritis, and TIA. Recently admitted 12/8-12/11/14 for syncopal episode from inappropriate ATP which accelerated SVT>>VT with failed ATP and then spontaneous termination of VT. During hospital stay had repeat RHC, hemodynamics were stable and had TEE-DC-CV of AFL 10/15/13 which was successful. He was presented to our [facility] team and felt to be good LVAD candidate and then presented at [facility] transplant listing conference and they felt he was not a candidate for transplant, but good candidate for LVAD under DT criteria.   Echo 12/27: LV is small with EF 15%. RV not significantly dilated but severely HK.  12/30: Milrinone begun with decreased Coox  12/31: TEE-guided DCCV back to NSR.  1/7: Co-ox 64, milrinone weaned to 0.125 1/8: Co-ox drop to 48, milrinone back up to 0.25  Overall feeling better with milrinone back up to 0.25. Co-ox 79%. Weight stable. hgb up to 9.8 after transfusion. This am developed dry heaves. (Says this happens to him every few months and resolves quickly). Denies ab pain or other symptoms.   Co-ox 55%->53%->56%->60%>62%>59>64% (milrinone turned down to 0.125)>52%>48% (mirlinone back to 0.25) >62.9%>79%  Hgb 8.1 yesterday; 7.3 today   LVAD INTERROGATION:  HeartMate II LVAD:  Flow 4.8  liters/min, speed 8600, power 5.0,  PI 7.3 Alarms: none Events: occasional PI events       Objective:    Vital Signs:   Temp:  [97.7 F (36.5 C)-98.6 F (37 C)] 98.5 F (36.9 C) (01/10 0100) Pulse Rate:  [75-80] 77 (01/10 0100) Resp:  [18-20] 18 (01/10 0100) BP: (86-90)/(0) 89/0 mmHg (01/09  1338) SpO2:  [92 %-98 %] 94 % (01/10 0100) Weight:  [90.2 kg (198 lb 13.7 oz)] 90.2 kg (198 lb 13.7 oz) (01/10 0629) Last BM Date: 11/13/13  Intake/Output:   Intake/Output Summary (Last 24 hours) at 11/14/13 1018 Last data filed at 11/14/13 0857  Gross per 24 hour  Intake   1025 ml  Output   1751 ml  Net   -726 ml     Physical Exam: General: Lying in bed with dry heaves HEENT: normal  Neck: supple. JVP 7 Carotids 2+ bilat; no bruits. No lymphadenopathy or thryomegaly appreciated.  Cor: Faint heart sounds , LVAD hum present.  Lungs: CTA Abdomen: soft, nontender, nondistended. No hepatosplenomegaly. No bruits or masses. Active bowel sounds.  Driveline: C/D/I; securement device intact and driveline incorporated  Extremities: warm. TED hose in place; trace edema' RUE PICC 2L Neuro: alert & orientedx3, cranial nerves grossly intact. moves all 4 extremities w/o difficulty. Affect pleasant   Telemetry: a-paced, 75  Labs: Basic Metabolic Panel:  Recent Labs Lab 11/10/13 0520 11/11/13 0557 11/12/13 0535 11/13/13 0535 11/14/13 0500  NA 135* 136* 138 137 136*  K 3.9 3.8 4.0 4.0 4.0  CL 95* 96 98 100 100  CO2 30 28 29 29 28   GLUCOSE 89 98 93 97 90  BUN 19 18 20 23 23   CREATININE 1.40* 1.32 1.33 1.34 1.31  CALCIUM 8.1* 8.2* 8.1* 7.8* 8.0*    Liver Function Tests:  Recent Labs Lab 11/10/13 0520 11/11/13 0557  11/12/13 0535 11/13/13 0535 11/14/13 0500  AST 35 32 28 23 26   ALT 29 26 23 19 20   ALKPHOS 102 97 93 86 92  BILITOT 0.4 0.4 0.3 0.3 0.4  PROT 5.2* 5.2* 5.1* 4.6* 4.9*  ALBUMIN 2.2* 2.2* 2.2* 2.0* 2.1*   No results found for this basename: LIPASE, AMYLASE,  in the last 168 hours No results found for this basename: AMMONIA,  in the last 168 hours  CBC:  Recent Labs Lab 11/10/13 0520 11/11/13 0557 11/12/13 0535 11/13/13 0535 11/14/13 0500  WBC 6.9 6.8 7.3 6.9 7.8  HGB 8.9* 8.8* 8.1* 7.3* 9.8*  HCT 27.5* 26.5* 25.8* 22.4* 30.1*  MCV 94.2 92.7 93.8 93.7  89.3  PLT 309 303 279 240 249    INR:  2.5  Recent Labs Lab 11/10/13 0520 11/11/13 0557 11/12/13 0535 11/13/13 0535 11/14/13 0500  INR 2.26* 2.37* 2.52* 2.48* 2.61*   Other results:    Imaging: No results found.   Medications:     Scheduled Medications: . amiodarone  200 mg Oral BID  . aspirin EC  325 mg Oral Daily  . bisacodyl  10 mg Oral Daily   Or  . bisacodyl  10 mg Rectal Daily  . digoxin  0.0625 mg Oral Daily  . docusate sodium  200 mg Oral Daily  . feeding supplement (ENSURE COMPLETE)  237 mL Oral BID BM  . ferrous gluconate  324 mg Oral TID WC  . guaiFENesin  600 mg Oral BID  . midodrine  5 mg Oral TID WC  . pantoprazole  40 mg Oral Daily  . potassium chloride  20 mEq Oral Daily  . ranolazine  500 mg Oral BID  . sodium chloride  3 mL Intravenous Q12H  . torsemide  20 mg Oral Daily  . warfarin  2 mg Oral q1800  . Warfarin - Pharmacist Dosing Inpatient   Does not apply q1800    Infusions: . sodium chloride 20 mL/hr at 10/22/13 0800  . sodium chloride 10 mL/hr (11/05/13 2058)  . sodium chloride 250 mL (10/20/13 0600)  . sodium chloride 20 mL/hr (11/13/13 [id number])  . sodium chloride    . milrinone 0.25 mcg/kg/min (11/14/13 0116)    PRN Medications: acetaminophen, diclofenac sodium, ondansetron (ZOFRAN) IV, sodium chloride, sodium chloride, sodium chloride, traMADol   Assessment:   1) Chronic systolic HF, Class IV, EF 20-25%  --s/p HM II VAD implant 12/15  2) NICM/ICM  3) Recurrent Vtach  - ablation 09/2011 and 06/2013  4) Atrial flutter  - s/p DC-CV 10/15/2013  - Atrial fibrillation recurred post-op.  - TEE-guided DCCV 12/31 5) CAD with recanalized occluded RCA with left to right collat.  6) PAD: s/p aorto-bifem, bilateral renal artery bypass, bilateral fem-pop, and left CEA.  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  8) Pleural effusion s/p thoracentesis 1/4    Plan/Discussion:    Overall improved now that he is back on higher dose of  milrinone. Clearly inotrope dependent. Will need home milrinone and can try to wean slowly at home. Will continue to slowly diurese patient with 20 mg torsemide daily.  Has dry heaves this am but says he gets these episodes every few months. No other findings on exam. Will treat with Zofran. If continue will get KUB.   Hgb improved after transfusion. Check iron stores and FOBT. Can change labs to every other day starting Sunday.   Maintaining SR. Continue po amiodarone and ranolazine 500 bid. Pacing to 75 yesterday.  INR therapeutic, pharmacy dosing.  Continue to work with CR.   Will place for home O2 orders. Hopefully home on Monday  I reviewed the LVAD parameters from today, and compared the results to the patient's prior recorded data.  No programming changes were made.  The LVAD is functioning within specified parameters.  The patient performs LVAD self-test daily.  LVAD interrogation was negative for any significant power changes, alarms or PI events/speed drops.  LVAD equipment check completed and is in good working order.  Back-up equipment present.   LVAD education done on emergency procedures and precautions and reviewed exit site care.  Length of Stay: [place] Leora Platt,MD 10:18 AM

## 2013-11-14 NOTE — Progress Notes (Signed)
ANTICOAGULATION CONSULT NOTE - Follow Up Consult  Pharmacy Consult for Coumadin Indication: atrial fibrillation / s/p LVAD 12/15  Allergies  Allergen Reactions  . Demerol [Meperidine]     Paralysis. Could only move eyes.  . Meperidine Hcl Other (See Comments)    "CAN'T MOVE" CATATONIC PER PT.  [name] Opium Other (See Comments)    "CAN'T MOVE" CATATONIC PER PT.    Patient Measurements: Height: 6' (182.9 cm) Weight: 198 lb 13.7 oz (90.2 kg) IBW/kg (Calculated) : 77.6  Vital Signs: Temp: 98.5 F (36.9 C) (01/10 0100) Temp src: Oral (01/10 0100) Pulse Rate: 77 (01/10 0100)  Labs:  Recent Labs  11/12/13 0535 11/13/13 0535 11/14/13 0500  HGB 8.1* 7.3* 9.8*  HCT 25.8* 22.4* 30.1*  PLT 279 240 249  LABPROT 26.3* 26.0* 27.0*  INR 2.52* 2.48* 2.61*  CREATININE 1.33 1.34 1.31    Estimated Creatinine Clearance: 54.3 ml/min (by C-G formula based on Cr of 1.31).   Medical History: Past Medical History  Diagnosis Date  . CAD (coronary artery disease)     s/p MI 1982;  s/p DES to CFX 2011;  s/p NSTEMI 4/12 in setting of VTach;  cath 7/12:  LM ok, LAD mid 30-40%, Dx's with 40%, mCFX stent patent, prox to mid RCA occluded with R to R and L to R collats.  Medical Tx was continued  . Bradycardia   . Ventricular tachycardia     s/p AICD;   h/o ICD shock;  Amiodarone Rx. S/P ablation in 2012  . 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 HTN (hypertension)   . HLD (hyperlipidemia)   . Cytopenia   . Hypothyroidism   . Renal insufficiency   . Claudication   . Chronic systolic heart failure   . Ischemic cardiomyopathy     echo 7/12:  EF 25-30%, mild AI, mod MR, mod LAE  . TIA (transient ischemic attack) 2001  . CVD (cerebrovascular disease)     left CEA 2008  . Stroke   . Anemia   . Inappropriate therapy from implantable cardioverter-defibrillator  04/02/2013    ATP for sinus tach  SVT wavelet identified SVT but was no  passive   . Myocardial infarction 1992  . Anginal pain   . Automatic implantable cardioverter-defibrillator in situ     Medtronic Evera device serial number [id number]    . Sleep apnea     denies wearing mask (05/25/2013)  . Arthritis     "a little in my knees" (05/25/2013)  . Melanoma of ear 2013    "near left ear" (05/25/2013)    Assessment: 73 yo M with HF s/p LVAD placement 12/15.  Afib > SR 70s s/p DCCV 12/31. Current tx amiodarone, ranolazine, digoxin.   INR 2.61 today.  No bleeding noted and H/H has trended up after blood transfusion.  Anemia panel pending. Iron supplement was increased to TID yesterday.  Goal of Therapy:  INR 2-3 Monitor platelets by anticoagulation protocol: Yes   Plan:  Continue Coumadin 2mg  daily Daily INR  Albertina Parr, PharmD.  Clinical Pharmacist Pager [phone]

## 2013-11-14 NOTE — Progress Notes (Signed)
CARDIAC REHAB PHASE I   PRE:  Rate/Rhythm: 75 paced  BP:  Sitting: 94     SaO2: 93 RA  MODE:  Ambulation: 300 ft   POST:  Rate/Rhythm: 78 paced  BP:  Sitting: 96    SaO2: 92 RA  Pt walked 300 with RW and assist x2.  Pt tolerated walk well, but seemed fatigue today.  No c/o during ambulation.  Pt stated he did not have any questions.  [id number]  Lillia Dallas MS, ACSM RCEP 1:58 PM 11/14/2013

## 2013-11-15 LAB — CBC
HCT: 27.1 % — ABNORMAL LOW (ref 39.0–52.0)
HCT: 25.5 % — ABNORMAL LOW (ref 39.0–52.0)
HEMOGLOBIN: 8.8 g/dL — AB (ref 13.0–17.0)
HEMOGLOBIN: 8.3 g/dL — AB (ref 13.0–17.0)
MCH: 29.4 pg (ref 26.0–34.0)
MCH: 30.1 pg (ref 26.0–34.0)
MCHC: 32.5 g/dL (ref 30.0–36.0)
MCHC: 32.5 g/dL (ref 30.0–36.0)
MCV: 90.6 fL (ref 78.0–100.0)
MCV: 92.4 fL (ref 78.0–100.0)
PLATELETS: 267 10*3/uL (ref 150–400)
Platelets: 243 10*3/uL (ref 150–400)
RBC: 2.99 MIL/uL — ABNORMAL LOW (ref 4.22–5.81)
RBC: 2.76 MIL/uL — AB (ref 4.22–5.81)
RDW: 18.8 % — ABNORMAL HIGH (ref 11.5–15.5)
RDW: 19.1 % — ABNORMAL HIGH (ref 11.5–15.5)
WBC: 8.1 10*3/uL (ref 4.0–10.5)
WBC: 8.2 10*3/uL (ref 4.0–10.5)

## 2013-11-15 LAB — COMPREHENSIVE METABOLIC PANEL
ALT: 18 U/L (ref 0–53)
AST: 24 U/L (ref 0–37)
Albumin: 2.1 g/dL — ABNORMAL LOW (ref 3.5–5.2)
Alkaline Phosphatase: 91 U/L (ref 39–117)
BUN: 23 mg/dL (ref 6–23)
CO2: 28 mEq/L (ref 19–32)
CREATININE: 1.42 mg/dL — AB (ref 0.50–1.35)
Calcium: 7.9 mg/dL — ABNORMAL LOW (ref 8.4–10.5)
Chloride: 101 mEq/L (ref 96–112)
GFR calc Af Amer: 55 mL/min — ABNORMAL LOW (ref >90)
GFR, EST NON AFRICAN AMERICAN: 47 mL/min — AB (ref >90)
Glucose, Bld: 90 mg/dL (ref 70–99)
Potassium: 4.3 mEq/L (ref 3.7–5.3)
Sodium: 138 mEq/L (ref 137–147)
TOTAL PROTEIN: 4.9 g/dL — AB (ref 6.0–8.3)
Total Bilirubin: 0.4 mg/dL (ref 0.3–1.2)

## 2013-11-15 LAB — PROTIME-INR
INR: 2.38 — ABNORMAL HIGH (ref 0.00–1.49)
PROTHROMBIN TIME: 25.2 s — AB (ref 11.6–15.2)

## 2013-11-15 LAB — CARBOXYHEMOGLOBIN
CARBOXYHEMOGLOBIN: 2.7 % — AB (ref 0.5–1.5)
Methemoglobin: 1.6 % — ABNORMAL HIGH (ref 0.0–1.5)
O2 SAT: 50.2 %
Total hemoglobin: 8.9 g/dL — ABNORMAL LOW (ref 13.5–18.0)

## 2013-11-15 LAB — LACTATE DEHYDROGENASE: LDH: 389 U/L — AB (ref 94–250)

## 2013-11-15 MED ORDER — SODIUM CHLORIDE 0.9 % IV BOLUS (SEPSIS)
250.0000 mL | Freq: Once | INTRAVENOUS | Status: AC
  Administered 2013-11-15: 250 mL via INTRAVENOUS

## 2013-11-15 NOTE — Progress Notes (Addendum)
Pt ambulated in hallway 338ft with rollator and girlfriend. Pt tolerated activity well. Will continue to monitor.

## 2013-11-15 NOTE — Progress Notes (Signed)
Dr. Haroldine Laws updated and aware of Pt's status. New orders given and activated. Will continue to monitor.

## 2013-11-15 NOTE — Progress Notes (Signed)
ANTICOAGULATION CONSULT NOTE - Follow Up Consult  Pharmacy Consult for Coumadin Indication: atrial fibrillation / s/p LVAD 12/15  Allergies  Allergen Reactions  . Demerol [Meperidine]     Paralysis. Could only move eyes.  . Meperidine Hcl Other (See Comments)    "CAN'T MOVE" CATATONIC PER PT.  [name] Opium Other (See Comments)    "CAN'T MOVE" CATATONIC PER PT.    Patient Measurements: Height: 6' (182.9 cm) Weight: 195 lb 1.6 oz (88.497 kg) IBW/kg (Calculated) : 77.6  Vital Signs: Temp: 98.4 F (36.9 C) (01/11 0445) Temp src: Oral (01/11 0445) Pulse Rate: 75 (01/11 1053)  Labs:  Recent Labs  11/13/13 0535 11/14/13 0500 11/15/13 0500  HGB 7.3* 9.8* 8.8*  HCT 22.4* 30.1* 27.1*  PLT 240 249 267  LABPROT 26.0* 27.0* 25.2*  INR 2.48* 2.61* 2.38*  CREATININE 1.34 1.31 1.42*    Estimated Creatinine Clearance: 50.1 ml/min (by C-G formula based on Cr of 1.42).   Medical History: Past Medical History  Diagnosis Date  . CAD (coronary artery disease)     s/p MI 1982;  s/p DES to CFX 2011;  s/p NSTEMI 4/12 in setting of VTach;  cath 7/12:  LM ok, LAD mid 30-40%, Dx's with 40%, mCFX stent patent, prox to mid RCA occluded with R to R and L to R collats.  Medical Tx was continued  . Bradycardia   . Ventricular tachycardia     s/p AICD;   h/o ICD shock;  Amiodarone Rx. S/P ablation in 2012  . 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 HTN (hypertension)   . HLD (hyperlipidemia)   . Cytopenia   . Hypothyroidism   . Renal insufficiency   . Claudication   . Chronic systolic heart failure   . Ischemic cardiomyopathy     echo 7/12:  EF 25-30%, mild AI, mod MR, mod LAE  . TIA (transient ischemic attack) 2001  . CVD (cerebrovascular disease)     left CEA 2008  . Stroke   . Anemia   . Inappropriate therapy from implantable cardioverter-defibrillator  04/02/2013    ATP for sinus tach  SVT wavelet identified SVT but was no  passive   . Myocardial infarction 1992  . Anginal pain   . Automatic implantable cardioverter-defibrillator in situ     Medtronic Evera device serial number [id number]    . Sleep apnea     denies wearing mask (05/25/2013)  . Arthritis     "a little in my knees" (05/25/2013)  . Melanoma of ear 2013    "near left ear" (05/25/2013)    Assessment: 73 yo M with HF s/p LVAD placement 12/15.  Afib > SR 70s s/p DCCV 12/31. Current tx amiodarone, ranolazine, digoxin.   INR 2.38 today.  H/H had improved after transfusion but is trending down again. Iron panel ok. Currently on Iron TID.   Goal of Therapy:  INR 2-3 Monitor platelets by anticoagulation protocol: Yes   Plan:  Continue Coumadin 2mg  daily Daily INR  Albertina Parr, PharmD.  Clinical Pharmacist Pager [phone]

## 2013-11-15 NOTE — Progress Notes (Signed)
Pt ambulated in hallway 300ft with rollator and girlfriend. Pt tolerated activity well. Will continue to monitor.   

## 2013-11-15 NOTE — Progress Notes (Signed)
Pt not able to ambulate in hallway at this time due to nausea and diarrhea.  All laxatives/softeners held.Will try again later. Will continue to monitor.

## 2013-11-15 NOTE — Progress Notes (Signed)
Pt stood up to urinate and noticed Pt's PI dropped from 8.8 to 2.0. Pt states he "feels fine". Pt's MAP stable, see doc flowsheets. Molly with VAD team notified. No new orders at this time. Will continue to monitor.

## 2013-11-15 NOTE — Progress Notes (Signed)
Pt unable to ambulate at this time due to diarrhea. Ensure held due to upset stomach per pt request. Will try again later. Will continue to monitor.

## 2013-11-15 NOTE — Progress Notes (Signed)
Patient ID: [name], male   DOB: 10-06-1941, 73 y.o.   MRN: [id number]  HeartMate 2 Rounding Note  Subjective:    [name] is a 73 yo man with an ischemic CM s/p ICD implant, chronic systolic HF (EF 81-19%), paroxysmal VT s/p ablation (09/2011) and repeat (06/2013), CAD, PVD (s/p R fem pop BPG 01/2010; s/p L CFA BPG 05/2002), AAA (s/p repair 04/2002), CAS (s/p L CEA 2008), HTN, arthritis, and TIA. Recently admitted 12/8-12/11/14 for syncopal episode from inappropriate ATP which accelerated SVT>>VT with failed ATP and then spontaneous termination of VT. During hospital stay had repeat RHC, hemodynamics were stable and had TEE-DC-CV of AFL 10/15/13 which was successful. He was presented to our [facility] team and felt to be good LVAD candidate and then presented at [facility] transplant listing conference and they felt he was not a candidate for transplant, but good candidate for LVAD under DT criteria.   Echo 12/27: LV is small with EF 15%. RV not significantly dilated but severely HK. 12/31: TEE-guided DCCV back to NSR.   Feeling well. No SOB. Walked only once yesterday 300 ft. Weigh down to 195. Co-ox back down   Co-ox 55%->53%->56%->60%>62%>59>64% (milrinone turned down to 0.125)>52%>48% (mirlinone back to 0.25) >62.9%>79%>50%  LVAD INTERROGATION:  HeartMate II LVAD:  Flow 4.9  liters/min, speed 8600, power 5.3,  PI 8.1 Alarms: none Events: occasional PI events       Objective:    Vital Signs:   Temp:  [97.6 F (36.4 C)-98.4 F (36.9 C)] 98.4 F (36.9 C) (01/11 0445) Pulse Rate:  [75-80] 75 (01/11 0800) Resp:  [16] 16 (01/11 0445) SpO2:  [93 %-100 %] 93 % (01/11 0445) Weight:  [88.497 kg (195 lb 1.6 oz)] 88.497 kg (195 lb 1.6 oz) (01/11 0445) Last BM Date: 11/14/13  Intake/Output:   Intake/Output Summary (Last 24 hours) at 11/15/13 1013 Last data filed at 11/15/13 0840  Gross per 24 hour  Intake    820 ml  Output   1025 ml  Net   -205 ml     Physical Exam: General: Lying  in bed with dry heaves HEENT: normal  Neck: supple. JVP 7 Carotids 2+ bilat; no bruits. No lymphadenopathy or thryomegaly appreciated.  Cor: Faint heart sounds , LVAD hum present.  Lungs: CTA Abdomen: soft, nontender, nondistended. No hepatosplenomegaly. No bruits or masses. Active bowel sounds.  Driveline: C/D/I; securement device intact and driveline incorporated  Extremities: warm. TED hose in place; trace edema' RUE PICC 2L Neuro: alert & orientedx3, cranial nerves grossly intact. moves all 4 extremities w/o difficulty. Affect pleasant   Telemetry: a-paced, 75  Labs: Basic Metabolic Panel:  Recent Labs Lab 11/11/13 0557 11/12/13 0535 11/13/13 0535 11/14/13 0500 11/15/13 0500  NA 136* 138 137 136* 138  K 3.8 4.0 4.0 4.0 4.3  CL 96 98 100 100 101  CO2 28 29 29 28 28   GLUCOSE 98 93 97 90 90  BUN 18 20 23 23 23   CREATININE 1.32 1.33 1.34 1.31 1.42*  CALCIUM 8.2* 8.1* 7.8* 8.0* 7.9*    Liver Function Tests:  Recent Labs Lab 11/11/13 0557 11/12/13 0535 11/13/13 0535 11/14/13 0500 11/15/13 0500  AST 32 28 23 26 24   ALT 26 23 19 20 18   ALKPHOS 97 93 86 92 91  BILITOT 0.4 0.3 0.3 0.4 0.4  PROT 5.2* 5.1* 4.6* 4.9* 4.9*  ALBUMIN 2.2* 2.2* 2.0* 2.1* 2.1*   No results found for this basename: LIPASE, AMYLASE,  in the  last 168 hours No results found for this basename: AMMONIA,  in the last 168 hours  CBC:  Recent Labs Lab 11/11/13 0557 11/12/13 0535 11/13/13 0535 11/14/13 0500 11/15/13 0500  WBC 6.8 7.3 6.9 7.8 8.1  HGB 8.8* 8.1* 7.3* 9.8* 8.8*  HCT 26.5* 25.8* 22.4* 30.1* 27.1*  MCV 92.7 93.8 93.7 89.3 90.6  PLT 303 279 240 249 267    INR:  2.5  Recent Labs Lab 11/11/13 0557 11/12/13 0535 11/13/13 0535 11/14/13 0500 11/15/13 0500  INR 2.37* 2.52* 2.48* 2.61* 2.38*   Other results:    Imaging: No results found.   Medications:     Scheduled Medications: . amiodarone  200 mg Oral BID  . aspirin EC  325 mg Oral Daily  . bisacodyl  10 mg  Oral Daily   Or  . bisacodyl  10 mg Rectal Daily  . digoxin  0.0625 mg Oral Daily  . docusate sodium  200 mg Oral Daily  . feeding supplement (ENSURE COMPLETE)  237 mL Oral BID BM  . ferrous gluconate  324 mg Oral TID WC  . guaiFENesin  600 mg Oral BID  . midodrine  5 mg Oral TID WC  . pantoprazole  40 mg Oral Daily  . potassium chloride  20 mEq Oral Daily  . ranolazine  500 mg Oral BID  . sodium chloride  3 mL Intravenous Q12H  . torsemide  20 mg Oral Daily  . warfarin  2 mg Oral q1800  . Warfarin - Pharmacist Dosing Inpatient   Does not apply q1800    Infusions: . sodium chloride 20 mL/hr at 10/22/13 0800  . sodium chloride 10 mL/hr (11/05/13 2058)  . sodium chloride 250 mL (10/20/13 0600)  . sodium chloride 20 mL/hr (11/13/13 [id number])  . sodium chloride    . milrinone 0.25 mcg/kg/min (11/14/13 1942)    PRN Medications: acetaminophen, diclofenac sodium, ondansetron (ZOFRAN) IV, sodium chloride, sodium chloride, sodium chloride, traMADol   Assessment:   1) Chronic systolic HF, Class IV, EF 20-25%  --s/p HM II VAD implant 12/15  2) NICM/ICM  3) Recurrent Vtach  - ablation 09/2011 and 06/2013  4) Atrial flutter  - s/p DC-CV 10/15/2013  - Atrial fibrillation recurred post-op.  - TEE-guided DCCV 12/31 5) CAD with recanalized occluded RCA with left to right collat.  6) PAD: s/p aorto-bifem, bilateral renal artery bypass, bilateral fem-pop, and left CEA.  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  8) Pleural effusion s/p thoracentesis 1/4    Plan/Discussion:    Overall improved now that he is back on higher dose of milrinone. Clearly inotrope dependent. Will need home milrinone and can try to wean slowly at home. Will continue to slowly diurese patient with 20 mg torsemide daily. Co-ox down again but asymptomatic. Will follow.   Hgb improved after transfusion but drifting back down. Iron stores ok.  Will check FOBT.   Maintaining SR. Continue po amiodarone and ranolazine 500 bid.  Pacing to 75 yesterday.    INR therapeutic, pharmacy dosing.  Continue to work with CR.   Will place for home O2 orders. Hopefully home tomorrow. If hgb stable.  I reviewed the LVAD parameters from today, and compared the results to the patient's prior recorded data.  No programming changes were made.  The LVAD is functioning within specified parameters.  The patient performs LVAD self-test daily.  LVAD interrogation was negative for any significant power changes, alarms or PI events/speed drops.  LVAD equipment check completed and  is in good working order.  Back-up equipment present.   LVAD education done on emergency procedures and precautions and reviewed exit site care.  Length of Stay: [place] Aleesha Ring[place],MD 10:13 AM

## 2013-11-15 NOTE — Progress Notes (Signed)
Pt assisted to standing position to get back in bed. Pt's PI dropped to 1.8. Pt stated he is "Very, very tired." PI took a little while to recover, now back at 6.7. Pt's MAP is 86. Molly with VAD team paged and aware. Dr. Haroldine Laws to call unit shortly. Will continue to monitor.

## 2013-11-16 ENCOUNTER — Encounter (HOSPITAL_COMMUNITY)
Admission: AD | Disposition: A | Discharge status: Home Health | Payer: Self-pay | Source: Ambulatory Visit | Attending: Surgery

## 2013-11-16 DIAGNOSIS — I509 Heart failure, unspecified: Secondary | ICD-10-CM

## 2013-11-16 DIAGNOSIS — I517 Cardiomegaly: Secondary | ICD-10-CM

## 2013-11-16 HISTORY — PX: RIGHT HEART CATHETERIZATION: SHX5447

## 2013-11-16 LAB — CBC
HCT: 24.1 % — ABNORMAL LOW (ref 39.0–52.0)
Hemoglobin: 8.2 g/dL — ABNORMAL LOW (ref 13.0–17.0)
MCH: 30.7 pg (ref 26.0–34.0)
MCHC: 34 g/dL (ref 30.0–36.0)
MCV: 90.3 fL (ref 78.0–100.0)
Platelets: 241 10*3/uL (ref 150–400)
RBC: 2.67 MIL/uL — AB (ref 4.22–5.81)
RDW: 19.1 % — ABNORMAL HIGH (ref 11.5–15.5)
WBC: 7.1 10*3/uL (ref 4.0–10.5)

## 2013-11-16 LAB — COMPREHENSIVE METABOLIC PANEL
ALT: 18 U/L (ref 0–53)
AST: 23 U/L (ref 0–37)
Albumin: 2.2 g/dL — ABNORMAL LOW (ref 3.5–5.2)
Alkaline Phosphatase: 88 U/L (ref 39–117)
BUN: 22 mg/dL (ref 6–23)
CALCIUM: 8 mg/dL — AB (ref 8.4–10.5)
CO2: 27 meq/L (ref 19–32)
Chloride: 100 mEq/L (ref 96–112)
Creatinine, Ser: 1.41 mg/dL — ABNORMAL HIGH (ref 0.50–1.35)
GFR, EST AFRICAN AMERICAN: 55 mL/min — AB (ref >90)
GFR, EST NON AFRICAN AMERICAN: 48 mL/min — AB (ref >90)
GLUCOSE: 92 mg/dL (ref 70–99)
Potassium: 4.1 mEq/L (ref 3.7–5.3)
Sodium: 136 mEq/L — ABNORMAL LOW (ref 137–147)
Total Bilirubin: 0.4 mg/dL (ref 0.3–1.2)
Total Protein: 4.9 g/dL — ABNORMAL LOW (ref 6.0–8.3)

## 2013-11-16 LAB — PROTIME-INR
INR: 2.53 — AB (ref 0.00–1.49)
Prothrombin Time: 26.4 seconds — ABNORMAL HIGH (ref 11.6–15.2)

## 2013-11-16 LAB — CARBOXYHEMOGLOBIN
Carboxyhemoglobin: 2.2 % — ABNORMAL HIGH (ref 0.5–1.5)
Methemoglobin: 0.7 % (ref 0.0–1.5)
O2 SAT: 49.7 %
Total hemoglobin: 10.2 g/dL — ABNORMAL LOW (ref 13.5–18.0)

## 2013-11-16 LAB — PREPARE RBC (CROSSMATCH)

## 2013-11-16 LAB — LACTATE DEHYDROGENASE: LDH: 337 U/L — ABNORMAL HIGH (ref 94–250)

## 2013-11-16 LAB — PRO B NATRIURETIC PEPTIDE: Pro B Natriuretic peptide (BNP): 3884 pg/mL — ABNORMAL HIGH (ref 0–125)

## 2013-11-16 SURGERY — RIGHT HEART CATH
Anesthesia: LOCAL

## 2013-11-16 MED ORDER — SODIUM CHLORIDE 0.9 % IJ SOLN: 3.0000 mL | Freq: Two times a day (BID) | INTRAMUSCULAR | Status: DC

## 2013-11-16 MED ORDER — LIDOCAINE HCL (PF) 1 % IJ SOLN
INTRAMUSCULAR | Status: AC
  Filled 2013-11-16: qty 30

## 2013-11-16 MED ORDER — SODIUM CHLORIDE 0.9 % IJ SOLN
3.0000 mL | INTRAMUSCULAR | Status: DC | PRN
Start: 2013-11-16 — End: 2013-11-18

## 2013-11-16 MED ORDER — SODIUM CHLORIDE 0.9 % IV SOLN
250.0000 mL | INTRAVENOUS | Status: DC | PRN
Start: 2013-11-16 — End: 2013-11-18

## 2013-11-16 MED ORDER — HEPARIN (PORCINE) IN NACL 2-0.9 UNIT/ML-% IJ SOLN
INTRAMUSCULAR | Status: AC
  Filled 2013-11-16: qty 1000

## 2013-11-16 MED ORDER — MIDAZOLAM HCL 2 MG/2ML IJ SOLN
INTRAMUSCULAR | Status: AC
  Filled 2013-11-16: qty 2

## 2013-11-16 MED ORDER — ONDANSETRON HCL 4 MG/2ML IJ SOLN
4.0000 mg | Freq: Four times a day (QID) | INTRAMUSCULAR | Status: DC | PRN
  Administered 2013-11-17: 4 mg via INTRAVENOUS
  Filled 2013-11-16: qty 2

## 2013-11-16 MED ORDER — SODIUM CHLORIDE 0.9 % IV SOLN: INTRAVENOUS | Status: DC

## 2013-11-16 MED ORDER — SODIUM CHLORIDE 0.9 % IV SOLN: 250.0000 mL | INTRAVENOUS | Status: DC | PRN

## 2013-11-16 MED ORDER — ASPIRIN 81 MG PO CHEW: 81.0000 mg | CHEWABLE_TABLET | ORAL | Status: DC

## 2013-11-16 MED ORDER — ACETAMINOPHEN 325 MG PO TABS
650.0000 mg | ORAL_TABLET | ORAL | Status: DC | PRN
Start: 2013-11-16 — End: 2013-11-18

## 2013-11-16 MED ORDER — SODIUM CHLORIDE 0.9 % IJ SOLN: 3.0000 mL | INTRAMUSCULAR | Status: DC | PRN

## 2013-11-16 MED ORDER — FENTANYL CITRATE 0.05 MG/ML IJ SOLN
INTRAMUSCULAR | Status: AC
  Filled 2013-11-16: qty 2

## 2013-11-16 NOTE — CV Procedure (Signed)
Cardiac Cath Procedure Note:  Indication:  HF  Procedures performed:  1) Right heart catheterization  Description of procedure:   The risks and indication of the procedure were explained. Consent was signed and placed on the chart. An appropriate timeout was taken prior to the procedure. The right groin was prepped and draped in the routine sterile fashion and anesthetized with 1% local lidocaine.   A 7 FR venous sheath was placed in the right femoral vein using a modified Seldinger technique. A standard Swan-Ganz catheter was used for the procedure.   Complications: None apparent.  Findings:  Done on milrinone 0.25 mcg/kg/min and VAD speed 8600  RA = 3 RV = 29/1/5  PA = 29/12 (17) PCW = 5 Fick cardiac output/index = 6.3/3.0 Thermodilution CO/CI = 4.6/2.2 PVR = 2.6 WU FA sat = 92% PA sat = 49%, 54%  Assessment/Plan:  Cardiac output low normal. He is dry. No evidence of significant RV failure on milrinone. Will hydrate. Stop lasix.  Daniel Bensimhon,MD 2:35 PM

## 2013-11-16 NOTE — Progress Notes (Signed)
Patient ID: [name], male   DOB: [birth year], 73 y.o.   MRN: [id number]  HeartMate 2 Rounding Note  Subjective:    [name] is a 73 yo man with an ischemic CM s/p ICD implant, chronic systolic HF (EF 0000000), paroxysmal VT s/p ablation (09/2011) and repeat (06/2013), CAD, PVD (s/p R fem pop BPG 01/2010; s/p L CFA BPG 05/2002), AAA (s/p repair 04/2002), CAS (s/p L CEA 2008), HTN, arthritis, and TIA. Recently admitted 12/8-12/11/14 for syncopal episode from inappropriate ATP which accelerated SVT>>VT with failed ATP and then spontaneous termination of VT. During hospital stay had repeat RHC, hemodynamics were stable and had TEE-DC-CV of AFL 10/15/13 which was successful. He was presented to our [facility] team and felt to be good LVAD candidate and then presented at [facility] transplant listing conference and they felt he was not a candidate for transplant, but good candidate for LVAD under DT criteria.   Echo 12/27: LV is small with EF 15%. RV not significantly dilated but severely HK. 12/31: TEE-guided DCCV back to NSR.   Weak yesterday with PI dropping particularly when standing. However, able to walk twice yesterday 450 ft. Weight down to 194. Co-ox back down; MAP 88 - 92. BNP 3884.  Hgb 8.2; LDH stable 337;   Co-ox 55%->53%->56%->60%>62%>59>64% (milrinone turned down to 0.125)>52%>48% (mirlinone back to 0.25) >62.9%>79%>50%>49.7  LVAD INTERROGATION:  HeartMate II LVAD:  Flow 4.3  liters/min, speed 8600, power 4.5,  PI 7.7 Alarms: none Events: 14 PI events        Objective:    Vital Signs:   Temp:  [97.1 F (36.2 C)-98.3 F (36.8 C)] 97.9 F (36.6 C) (01/12 1717) Pulse Rate:  [75-86] 86 (01/12 1717) Resp:  [18-20] 20 (01/12 1551) SpO2:  [93 %-95 %] 94 % (01/12 1717) Weight:  [88.315 kg (194 lb 11.2 oz)] 88.315 kg (194 lb 11.2 oz) (01/12 0500) Last BM Date: 11/14/13  Intake/Output:   Intake/Output Summary (Last 24 hours) at 11/16/13 1744 Last data filed at 11/16/13 1720  Gross  per 24 hour  Intake  394.4 ml  Output   1600 ml  Net -1205.6 ml     Physical Exam: General: Sitting in chair, feels good HEENT: normal  Neck: supple. JVP 7 Carotids 2+ bilat; no bruits. No lymphadenopathy or thryomegaly appreciated.  Cor: Faint heart sounds , LVAD hum present.  Lungs: CTA Abdomen: soft, nontender, nondistended. No hepatosplenomegaly. No bruits or masses. Active bowel sounds.  Driveline: C/D/I; securement device intact and driveline incorporated  Extremities: warm. TED hose in place; trace edema' RUE PICC 2L Neuro: alert & orientedx3, cranial nerves grossly intact. moves all 4 extremities w/o difficulty. Affect pleasant   Telemetry: a-paced, 75  Labs: Basic Metabolic Panel:  Recent Labs Lab 11/12/13 0535 11/13/13 0535 11/14/13 0500 11/15/13 0500 11/16/13 0529  NA 138 137 136* 138 136*  K 4.0 4.0 4.0 4.3 4.1  CL 98 100 100 101 100  CO2 29 29 28 28 27   GLUCOSE 93 97 90 90 92  BUN 20 23 23 23 22   CREATININE 1.33 1.34 1.31 1.42* 1.41*  CALCIUM 8.1* 7.8* 8.0* 7.9* 8.0*    Liver Function Tests:  Recent Labs Lab 11/12/13 0535 11/13/13 0535 11/14/13 0500 11/15/13 0500 11/16/13 0529  AST 28 23 26 24 23   ALT 23 19 20 18 18   ALKPHOS 93 86 92 91 88  BILITOT 0.3 0.3 0.4 0.4 0.4  PROT 5.1* 4.6* 4.9* 4.9* 4.9*  ALBUMIN 2.2* 2.0* 2.1* 2.1*  2.2*   No results found for this basename: LIPASE, AMYLASE,  in the last 168 hours No results found for this basename: AMMONIA,  in the last 168 hours  CBC:  Recent Labs Lab 11/13/13 0535 11/14/13 0500 11/15/13 0500 11/15/13 1900 11/16/13 0529  WBC 6.9 7.8 8.1 8.2 7.1  HGB 7.3* 9.8* 8.8* 8.3* 8.2*  HCT 22.4* 30.1* 27.1* 25.5* 24.1*  MCV 93.7 89.3 90.6 92.4 90.3  PLT 240 249 267 243 241    INR:  2.5  Recent Labs Lab 11/12/13 0535 11/13/13 0535 11/14/13 0500 11/15/13 0500 11/16/13 0529  INR 2.52* 2.48* 2.61* 2.38* 2.53*   Other results:    Imaging: No results found.   Medications:      Scheduled Medications: . amiodarone  200 mg Oral BID  . aspirin EC  325 mg Oral Daily  . bisacodyl  10 mg Oral Daily   Or  . bisacodyl  10 mg Rectal Daily  . digoxin  0.0625 mg Oral Daily  . docusate sodium  200 mg Oral Daily  . feeding supplement (ENSURE COMPLETE)  237 mL Oral BID BM  . ferrous gluconate  324 mg Oral TID WC  . guaiFENesin  600 mg Oral BID  . midodrine  5 mg Oral TID WC  . pantoprazole  40 mg Oral Daily  . potassium chloride  20 mEq Oral Daily  . ranolazine  500 mg Oral BID  . sodium chloride  3 mL Intravenous Q12H  . sodium chloride  3 mL Intravenous Q12H  . warfarin  2 mg Oral q1800  . Warfarin - Pharmacist Dosing Inpatient   Does not apply q1800    Infusions: . sodium chloride 20 mL/hr at 10/22/13 0800  . sodium chloride 15 mL/hr at 11/15/13 1219  . sodium chloride 250 mL (10/20/13 0600)  . sodium chloride    . milrinone 0.25 mcg/kg/min (11/16/13 0355)    PRN Medications: sodium chloride, acetaminophen, diclofenac sodium, ondansetron (ZOFRAN) IV, sodium chloride, sodium chloride, sodium chloride, sodium chloride, traMADol   Assessment:   1) Chronic systolic HF, Class IV, EF 20-25%  --s/p HM II VAD implant 12/15  2) NICM/ICM  3) Recurrent Vtach  - ablation 09/2011 and 06/2013  4) Atrial flutter  - s/p DC-CV 10/15/2013  - Atrial fibrillation recurred post-op.  - TEE-guided DCCV 12/31 5) CAD with recanalized occluded RCA with left to right collat.  6) PAD: s/p aorto-bifem, bilateral renal artery bypass, bilateral fem-pop, and left CEA.  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  8) Pleural effusion s/p thoracentesis 1/4    Plan/Discussion:    He is struggling again with orthostasis, PI events and low co-ox despite milrinone support. We actually had to give him some fluid last night. Will hold torsemide and proceed with RHC today.   Hgb also starting to trend back down and suspect he has slow GIB. Will guaiac stools. Iron stores ok. Low threshold to  transfuse.  Maintaining NSR on amio/ranexa.   Coumadin per pharmacy.   I reviewed the LVAD parameters from today, and compared the results to the patient's prior recorded data.  No programming changes were made.  The LVAD is functioning within specified parameters.  The patient performs LVAD self-test daily.  LVAD interrogation was negative for any significant power changes, alarms or PI events/speed drops.  LVAD equipment check completed and is in good working order.  Back-up equipment present.   LVAD education done on emergency procedures and precautions and reviewed exit site care.  Length of Stay: [address] 5:44 PM

## 2013-11-16 NOTE — Progress Notes (Signed)
The Chaplain stopped by to visit with a patient for an initial visit but the patient's family member informed the Chaplain that her love one was resting and that he needed rest at the moment. A follow up visit with this patient will be carried out by a Chaplain.  Chaplain Clista Bernhardt Steffen Hase

## 2013-11-16 NOTE — Interval H&P Note (Signed)
History and Physical Interval Note:  11/16/2013 2:31 PM  [name]  has presented today for surgery, with the diagnosis of hf  The various methods of treatment have been discussed with the patient and family. After consideration of risks, benefits and other options for treatment, the patient has consented to  Procedure(s): RIGHT HEART CATH (N/A) as a surgical intervention .  The patient's history has been reviewed, patient examined, no change in status, stable for surgery.  I have reviewed the patient's chart and labs.  Questions were answered to the patient's satisfaction.     Daniel Bensimhon

## 2013-11-16 NOTE — Progress Notes (Signed)
Dressing changed by [name]; sterile technique used; will cont. To monitor.

## 2013-11-16 NOTE — Progress Notes (Signed)
OT Cancellation Note  Patient Details Name: [name] MRN: [id number] DOB: 1941-09-03   Cancelled Treatment:    Reason Eval/Treat Not Completed: Other (comment)--see OT note from earlier today. This note put in to get date to populate to logs.  Almon Register [phone] 11/16/2013, 5:03 PM

## 2013-11-16 NOTE — H&P (View-Only) (Signed)
Patient ID: [name], male   DOB: 10-06-1941, 73 y.o.   MRN: [id number]  HeartMate 2 Rounding Note  Subjective:    [name] is a 73 yo man with an ischemic CM s/p ICD implant, chronic systolic HF (EF 81-19%), paroxysmal VT s/p ablation (09/2011) and repeat (06/2013), CAD, PVD (s/p R fem pop BPG 01/2010; s/p L CFA BPG 05/2002), AAA (s/p repair 04/2002), CAS (s/p L CEA 2008), HTN, arthritis, and TIA. Recently admitted 12/8-12/11/14 for syncopal episode from inappropriate ATP which accelerated SVT>>VT with failed ATP and then spontaneous termination of VT. During hospital stay had repeat RHC, hemodynamics were stable and had TEE-DC-CV of AFL 10/15/13 which was successful. He was presented to our [facility] team and felt to be good LVAD candidate and then presented at [facility] transplant listing conference and they felt he was not a candidate for transplant, but good candidate for LVAD under DT criteria.   Echo 12/27: LV is small with EF 15%. RV not significantly dilated but severely HK. 12/31: TEE-guided DCCV back to NSR.   Feeling well. No SOB. Walked only once yesterday 300 ft. Weigh down to 195. Co-ox back down   Co-ox 55%->53%->56%->60%>62%>59>64% (milrinone turned down to 0.125)>52%>48% (mirlinone back to 0.25) >62.9%>79%>50%  LVAD INTERROGATION:  HeartMate II LVAD:  Flow 4.9  liters/min, speed 8600, power 5.3,  PI 8.1 Alarms: none Events: occasional PI events       Objective:    Vital Signs:   Temp:  [97.6 F (36.4 C)-98.4 F (36.9 C)] 98.4 F (36.9 C) (01/11 0445) Pulse Rate:  [75-80] 75 (01/11 0800) Resp:  [16] 16 (01/11 0445) SpO2:  [93 %-100 %] 93 % (01/11 0445) Weight:  [88.497 kg (195 lb 1.6 oz)] 88.497 kg (195 lb 1.6 oz) (01/11 0445) Last BM Date: 11/14/13  Intake/Output:   Intake/Output Summary (Last 24 hours) at 11/15/13 1013 Last data filed at 11/15/13 0840  Gross per 24 hour  Intake    820 ml  Output   1025 ml  Net   -205 ml     Physical Exam: General: Lying  in bed with dry heaves HEENT: normal  Neck: supple. JVP 7 Carotids 2+ bilat; no bruits. No lymphadenopathy or thryomegaly appreciated.  Cor: Faint heart sounds , LVAD hum present.  Lungs: CTA Abdomen: soft, nontender, nondistended. No hepatosplenomegaly. No bruits or masses. Active bowel sounds.  Driveline: C/D/I; securement device intact and driveline incorporated  Extremities: warm. TED hose in place; trace edema' RUE PICC 2L Neuro: alert & orientedx3, cranial nerves grossly intact. moves all 4 extremities w/o difficulty. Affect pleasant   Telemetry: a-paced, 75  Labs: Basic Metabolic Panel:  Recent Labs Lab 11/11/13 0557 11/12/13 0535 11/13/13 0535 11/14/13 0500 11/15/13 0500  NA 136* 138 137 136* 138  K 3.8 4.0 4.0 4.0 4.3  CL 96 98 100 100 101  CO2 28 29 29 28 28   GLUCOSE 98 93 97 90 90  BUN 18 20 23 23 23   CREATININE 1.32 1.33 1.34 1.31 1.42*  CALCIUM 8.2* 8.1* 7.8* 8.0* 7.9*    Liver Function Tests:  Recent Labs Lab 11/11/13 0557 11/12/13 0535 11/13/13 0535 11/14/13 0500 11/15/13 0500  AST 32 28 23 26 24   ALT 26 23 19 20 18   ALKPHOS 97 93 86 92 91  BILITOT 0.4 0.3 0.3 0.4 0.4  PROT 5.2* 5.1* 4.6* 4.9* 4.9*  ALBUMIN 2.2* 2.2* 2.0* 2.1* 2.1*   No results found for this basename: LIPASE, AMYLASE,  in the  last 168 hours No results found for this basename: AMMONIA,  in the last 168 hours  CBC:  Recent Labs Lab 11/11/13 0557 11/12/13 0535 11/13/13 0535 11/14/13 0500 11/15/13 0500  WBC 6.8 7.3 6.9 7.8 8.1  HGB 8.8* 8.1* 7.3* 9.8* 8.8*  HCT 26.5* 25.8* 22.4* 30.1* 27.1*  MCV 92.7 93.8 93.7 89.3 90.6  PLT 303 279 240 249 267    INR:  2.5  Recent Labs Lab 11/11/13 0557 11/12/13 0535 11/13/13 0535 11/14/13 0500 11/15/13 0500  INR 2.37* 2.52* 2.48* 2.61* 2.38*   Other results:    Imaging: No results found.   Medications:     Scheduled Medications: . amiodarone  200 mg Oral BID  . aspirin EC  325 mg Oral Daily  . bisacodyl  10 mg  Oral Daily   Or  . bisacodyl  10 mg Rectal Daily  . digoxin  0.0625 mg Oral Daily  . docusate sodium  200 mg Oral Daily  . feeding supplement (ENSURE COMPLETE)  237 mL Oral BID BM  . ferrous gluconate  324 mg Oral TID WC  . guaiFENesin  600 mg Oral BID  . midodrine  5 mg Oral TID WC  . pantoprazole  40 mg Oral Daily  . potassium chloride  20 mEq Oral Daily  . ranolazine  500 mg Oral BID  . sodium chloride  3 mL Intravenous Q12H  . torsemide  20 mg Oral Daily  . warfarin  2 mg Oral q1800  . Warfarin - Pharmacist Dosing Inpatient   Does not apply q1800    Infusions: . sodium chloride 20 mL/hr at 10/22/13 0800  . sodium chloride 10 mL/hr (11/05/13 2058)  . sodium chloride 250 mL (10/20/13 0600)  . sodium chloride 20 mL/hr (11/13/13 [id number])  . sodium chloride    . milrinone 0.25 mcg/kg/min (11/14/13 1942)    PRN Medications: acetaminophen, diclofenac sodium, ondansetron (ZOFRAN) IV, sodium chloride, sodium chloride, sodium chloride, traMADol   Assessment:   1) Chronic systolic HF, Class IV, EF 20-25%  --s/p HM II VAD implant 12/15  2) NICM/ICM  3) Recurrent Vtach  - ablation 09/2011 and 06/2013  4) Atrial flutter  - s/p DC-CV 10/15/2013  - Atrial fibrillation recurred post-op.  - TEE-guided DCCV 12/31 5) CAD with recanalized occluded RCA with left to right collat.  6) PAD: s/p aorto-bifem, bilateral renal artery bypass, bilateral fem-pop, and left CEA.  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  8) Pleural effusion s/p thoracentesis 1/4    Plan/Discussion:    Overall improved now that he is back on higher dose of milrinone. Clearly inotrope dependent. Will need home milrinone and can try to wean slowly at home. Will continue to slowly diurese patient with 20 mg torsemide daily. Co-ox down again but asymptomatic. Will follow.   Hgb improved after transfusion but drifting back down. Iron stores ok.  Will check FOBT.   Maintaining SR. Continue po amiodarone and ranolazine 500 bid.  Pacing to 75 yesterday.    INR therapeutic, pharmacy dosing.  Continue to work with CR.   Will place for home O2 orders. Hopefully home tomorrow. If hgb stable.  I reviewed the LVAD parameters from today, and compared the results to the patient's prior recorded data.  No programming changes were made.  The LVAD is functioning within specified parameters.  The patient performs LVAD self-test daily.  LVAD interrogation was negative for any significant power changes, alarms or PI events/speed drops.  LVAD equipment check completed and  is in good working order.  Back-up equipment present.   LVAD education done on emergency procedures and precautions and reviewed exit site care.  Length of Stay: [place] Bensimhon,MD 10:13 AM

## 2013-11-16 NOTE — Progress Notes (Signed)
OT Cancellation Note  Patient Details Name: [name] MRN: [id number] DOB: [birth year]   Cancelled Treatment:    Reason Eval/Treat Not Completed: Patient at procedure or test/ unavailable  Antonina Deziel M 11/16/2013, 4:28 PM

## 2013-11-16 NOTE — Progress Notes (Signed)
Hemoglobin-8.3. VAD setting per flowsheet. No changes. PI-7.3 at this time. Pt resting in bed without complaint or distress. Dr. Haroldine Laws made aware of Hgb, PI and MAP of 92 at this time. No new orders at this time.  Repeat CBC ordered for the Am. Will continue to assess. Pt states he only feels tired, but no other complaints.

## 2013-11-16 NOTE — Progress Notes (Signed)
Pt back from cardiac cath; R groin level 0; family at bedside; VSS; will cont. To monitor.

## 2013-11-16 NOTE — Progress Notes (Addendum)
PT Cancellation Note  Patient Details Name: [name] MRN: [id number] DOB: 1941-02-15   Cancelled Treatment:    Reason Eval/Treat Not Completed: Patient at procedure or test/unavailable--pt going down for Rt heart cath.   Yailene Badia 11/16/2013, 1:38 PM Pager [phone]

## 2013-11-16 NOTE — Progress Notes (Signed)
CARDIAC REHAB PHASE I   PRE:  Rate/Rhythm: pacing 76    BP: sitting 80    SaO2: 94 RA  MODE:  Ambulation: 150 ft   POST:  Rate/Rhythm: 88 pacing    BP: sitting 64, 5 min later lying back in recliner 88     SaO2: wouldn't register then 97 RA after 5 min  Pt looked good in recliner upon arrival, PI 7.9, VSS. SaO2 dropped to 2.0 standing. Did not increase. Needed min assist to stand. Encouraged pt to walk, had rollator to sit if needed.  Pt ambulated assist x2 (supervision) on RA.  Rest standing x1. At 150 ft pt stated legs were getting weaker and weaker. Color in face had steadily diminished during walk. Sat in hall in rollator, PI still low at 2.1. Did increase to 2.9 sitting, slow to increase. Pt stood and returned to recliner. MAP 64, PI still in low 2 range. Did begin to increase quicker (into 3s) with lying back. Could not get SaO2 to register initially. Pts color pale. Sts he felt very weak walking today. SOB stable. Will continue to follow.  [id number]  Josephina Shih [place] CES, ACSM 11/16/2013 9:04 AM

## 2013-11-16 NOTE — Progress Notes (Signed)
ANTICOAGULATION CONSULT NOTE - Follow Up Consult  Pharmacy Consult for Coumadin Indication: atrial fibrillation / s/p LVAD 12/15  Allergies  Allergen Reactions  . Demerol [Meperidine]     Paralysis. Could only move eyes.  . Meperidine Hcl Other (See Comments)    "CAN'T MOVE" CATATONIC PER PT.  [name] Opium Other (See Comments)    "CAN'T MOVE" CATATONIC PER PT.    Patient Measurements: Height: 6' (182.9 cm) Weight: 194 lb 11.2 oz (88.315 kg) IBW/kg (Calculated) : 77.6  Vital Signs: Temp: 97.1 F (36.2 C) (01/12 1255) Temp src: Oral (01/12 1255) Pulse Rate: 75 (01/12 1407)  Labs:  Recent Labs  11/14/13 0500 11/15/13 0500 11/15/13 1900 11/16/13 0529  HGB 9.8* 8.8* 8.3* 8.2*  HCT 30.1* 27.1* 25.5* 24.1*  PLT 249 267 243 241  LABPROT 27.0* 25.2*  --  26.4*  INR 2.61* 2.38*  --  2.53*  CREATININE 1.31 1.42*  --  1.41*    Estimated Creatinine Clearance: 50.4 ml/min (by C-G formula based on Cr of 1.41).   Medical History: Past Medical History  Diagnosis Date  . CAD (coronary artery disease)     s/p MI 1982;  s/p DES to CFX 2011;  s/p NSTEMI 4/12 in setting of VTach;  cath 7/12:  LM ok, LAD mid 30-40%, Dx's with 40%, mCFX stent patent, prox to mid RCA occluded with R to R and L to R collats.  Medical Tx was continued  . Bradycardia   . Ventricular tachycardia     s/p AICD;   h/o ICD shock;  Amiodarone Rx. S/P ablation in 2012  . 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 HTN (hypertension)   . HLD (hyperlipidemia)   . Cytopenia   . Hypothyroidism   . Renal insufficiency   . Claudication   . Chronic systolic heart failure   . Ischemic cardiomyopathy     echo 7/12:  EF 25-30%, mild AI, mod MR, mod LAE  . TIA (transient ischemic attack) 2001  . CVD (cerebrovascular disease)     left CEA 2008  . Stroke   . Anemia   . Inappropriate therapy  from implantable cardioverter-defibrillator 04/02/2013    ATP for sinus tach  SVT wavelet identified SVT but was no  passive   . Myocardial infarction 1992  . Anginal pain   . Automatic implantable cardioverter-defibrillator in situ     [employer] Evera device serial number [id number]    . Sleep apnea     denies wearing mask (05/25/2013)  . Arthritis     "a little in my knees" (05/25/2013)  . Melanoma of ear 2013    "near left ear" (05/25/2013)    Assessment: 73 yo M with HF s/p LVAD placement 12/15.  Afib > SR 70s s/p DCCV 12/31. Current tx amiodarone, ranolazine, digoxin.   INR 2.53 today.  H/H had improved after transfusion but is trending down again. Currently on Iron TID.   Goal of Therapy:  INR 2-3 Monitor platelets by anticoagulation protocol: Yes   Plan:  Continue Coumadin 2mg  daily Daily INR  Bonnita Nasuti Pharm.D. CPP, BCPS Clinical Pharmacist [phone] 11/16/2013 3:22 PM

## 2013-11-17 DIAGNOSIS — Z95811 Presence of heart assist device: Secondary | ICD-10-CM

## 2013-11-17 DIAGNOSIS — I5023 Acute on chronic systolic (congestive) heart failure: Secondary | ICD-10-CM

## 2013-11-17 DIAGNOSIS — Z95818 Presence of other cardiac implants and grafts: Secondary | ICD-10-CM

## 2013-11-17 LAB — COMPREHENSIVE METABOLIC PANEL
ALK PHOS: 90 U/L (ref 39–117)
ALT: 15 U/L (ref 0–53)
AST: 21 U/L (ref 0–37)
Albumin: 2.1 g/dL — ABNORMAL LOW (ref 3.5–5.2)
BILIRUBIN TOTAL: 0.4 mg/dL (ref 0.3–1.2)
BUN: 21 mg/dL (ref 6–23)
CALCIUM: 7.8 mg/dL — AB (ref 8.4–10.5)
CO2: 27 meq/L (ref 19–32)
Chloride: 103 mEq/L (ref 96–112)
Creatinine, Ser: 1.34 mg/dL (ref 0.50–1.35)
GFR, EST AFRICAN AMERICAN: 59 mL/min — AB (ref >90)
GFR, EST NON AFRICAN AMERICAN: 51 mL/min — AB (ref >90)
GLUCOSE: 93 mg/dL (ref 70–99)
POTASSIUM: 4.1 meq/L (ref 3.7–5.3)
Sodium: 139 mEq/L (ref 137–147)
Total Protein: 4.9 g/dL — ABNORMAL LOW (ref 6.0–8.3)

## 2013-11-17 LAB — LACTATE DEHYDROGENASE: LDH: 350 U/L — ABNORMAL HIGH (ref 94–250)

## 2013-11-17 LAB — PROTIME-INR
INR: 2.48 — ABNORMAL HIGH (ref 0.00–1.49)
PROTHROMBIN TIME: 26 s — AB (ref 11.6–15.2)

## 2013-11-17 LAB — TYPE AND SCREEN
ABO/RH(D): O POS
ANTIBODY SCREEN: NEGATIVE
UNIT DIVISION: 0
UNIT DIVISION: 0
Unit division: 0

## 2013-11-17 LAB — CBC
HCT: 26.7 % — ABNORMAL LOW (ref 39.0–52.0)
Hemoglobin: 8.9 g/dL — ABNORMAL LOW (ref 13.0–17.0)
MCH: 30.4 pg (ref 26.0–34.0)
MCHC: 33.3 g/dL (ref 30.0–36.0)
MCV: 91.1 fL (ref 78.0–100.0)
Platelets: 258 10*3/uL (ref 150–400)
RBC: 2.93 MIL/uL — ABNORMAL LOW (ref 4.22–5.81)
RDW: 19.2 % — ABNORMAL HIGH (ref 11.5–15.5)
WBC: 8.2 10*3/uL (ref 4.0–10.5)

## 2013-11-17 LAB — POCT I-STAT 3, VENOUS BLOOD GAS (G3P V)
ACID-BASE EXCESS: 2 mmol/L (ref 0.0–2.0)
Acid-Base Excess: 1 mmol/L (ref 0.0–2.0)
Bicarbonate: 26.3 mEq/L — ABNORMAL HIGH (ref 20.0–24.0)
Bicarbonate: 25.7 mEq/L — ABNORMAL HIGH (ref 20.0–24.0)
O2 SAT: 49 %
O2 Saturation: 54 %
PH VEN: 7.43 — AB (ref 7.250–7.300)
PO2 VEN: 26 mmHg — AB (ref 30.0–45.0)
TCO2: 27 mmol/L (ref 0–100)
TCO2: 27 mmol/L (ref 0–100)
pCO2, Ven: 39.5 mmHg — ABNORMAL LOW (ref 45.0–50.0)
pCO2, Ven: 38.7 mmHg — ABNORMAL LOW (ref 45.0–50.0)
pH, Ven: 7.431 — ABNORMAL HIGH (ref 7.250–7.300)
pO2, Ven: 27 mmHg — CL (ref 30.0–45.0)

## 2013-11-17 LAB — CARBOXYHEMOGLOBIN
Carboxyhemoglobin: 2.8 % — ABNORMAL HIGH (ref 0.5–1.5)
Methemoglobin: 0.7 % (ref 0.0–1.5)
O2 Saturation: 59.8 %
TOTAL HEMOGLOBIN: 8.8 g/dL — AB (ref 13.5–18.0)

## 2013-11-17 LAB — PREPARE RBC (CROSSMATCH)

## 2013-11-17 NOTE — Progress Notes (Signed)
CSW met with patient and [name]. at bedside. Patient reports he needs "a bag of blood and I need to put some weight on". He reports that he had a cardiac cath yesterday and "my heart is working fine". Patient appears disappointed about continued stay in the hospital and is "ready" to go home. He is hopeful for d/c tomorrow. CSW provided support and will follow up in the morning for possible d/c. [name], LCSW [phone]

## 2013-11-17 NOTE — Progress Notes (Signed)
Pt out of the bed with x2 assist to be weighed on standing scale. Pt then transferred to the chair. Pt's PI went down while standing to transfer to 3.8. Pt settled in the chair and PI went back up to 6.8. Pt experienced some nausea after getting in the chair. PRN zofran 4mg  IV given for relief. During administration of zofran, milirinone drip stopped and line flushed prior to zofran and after. Drip restarted after 5 mins, per pharmacy instruction via telephone.  Will continue to monitor.

## 2013-11-17 NOTE — Progress Notes (Signed)
[facility]  Patient Status: New patient this admission   [facility] is providing the following services: [facility] will provide [facility]fusion [facility] services for home Milrinone therapy.  [facility] will provide in hospital pre discharge teaching to support pt's knowledge related to home inotropes.   [facility] will follow as inpatient and support DC home when deemed appropriate.   If patient discharges after hours, please call [phone].   Larry Sierras 11/17/2013, 4:03 PM

## 2013-11-17 NOTE — Discharge Summary (Signed)
Advanced Heart Failure Team  Discharge Summary   Patient ID: [name] MRN: [id number], DOB/AGE: 06/13/1941 73 y.o. Admit date: 10/18/2013 D/C date:     11/18/2013   Primary Discharge Diagnoses:   1) Chronic systolic HF, Class IV, EF 20-25%  --s/p HM II VAD implant 10/19/2013   2) NICM/ICM  3) Recurrent Vtach  - ablation 09/2011 and 06/2013  4) Atrial flutter  - s/p DC-CV 10/15/2013 , 11/04/13 successful DC-CV,  - Atrial fibrillation recurred post-op.  - TEE-guided DCCV 12/31  5) CAD with recanalized occluded RCA with left to right collat.  6) PAD: s/p aorto-bifem, bilateral renal artery bypass, bilateral fem-pop, and left CEA.  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  [name] was admitted for LVAD implant for destination therapy on October 19, 2013. He tolerated he procedure well and did not require RVAD support. He was taken straight to surgical intensive care unit on multiple drips.   Post -op he had a difficult course notable for VT, orthostatic hypotension, RV failure and A fib/Aflutter. On 10/21/2013 he developed sustained VT with multiple rounds of ATP and ultimately required urgent cardioversion. He was placed on amiodarone drip and later transitioned to po amiodarone. He developed A fib on 11/03/13 with low CO-OX and his PI dropped significantly while standing. After discussion with Dr Caryl Comes he was continued on amiodarone and ranolazine 500 mg bid was added to help maintain SR. QT-c followed closely and reamined stable. Despite maintenance of SR he had problems with orthostasis and low co-ox. He was started on midodrine.   He was also started on Milrinone at 0.125 mcg to support his RV Due to marginal CO-OX milrinone was increased to 0.25 mcg with improved CO-OX noted.  On November 16, 2013 he had a RHC due to increased PI events and fatigue. RHC showed low filling pressures and low cardiac output but no clear evidence of RV failure while on Milrinone. His diuretics were stopped. Hemoglobin also had dropped and he was given 1UPRBC with adequate rise in Hemoglobin. He continued to improve with his volume status optimized. For now he will remain off diuretics and will only take lasix 20 mg for a weight of 200 pounds or greater. He failed milrinone wean and it was decided that he would be discharged on milrinone which would be weaned as an outpatient. He will not be placed on beta blocker due to RV failure. At the time of discharge his INR was 2.8  and his CO-OX was 60%. Discharged weight 195 pounds.   While hospitalized he was followed by cardiac rehab and at the time of discharge he was able to ambulate 150 feet with a rolling walker.  He and his girl friend received extensive education on LVAD daily care by Jilda Roche the [facility].  He will be discharged on Milrinone at 0.25 mcg via PICC. Debroah Loop will  follow for [facility] and PICC maintenance. [facility] will provide Milrinone. He will have weekly BMET and INR by Arville Go which will be completed every Monday.  He will be followed closely in the HF clinic with follow up scheduled November 24, 2012. He was also found to require home O2 and this was ordered as well.   11/16/13 RHC Done on Milrinone 0.25 mcg VAD speed  at 8600 RA = 3  RV = 29/1/5  PA = 29/12 (17)  PCW = 5  Fick cardiac output/index = 6.3/3.0  Thermodilution CO/CI = 4.6/2.2  PVR = 2.6 WU  FA sat = 92%  PA sat = 49%, 54%                        Discharge Weight Range:  Discharge Vitals: Blood pressure 89/0, pulse 75, temperature 97.9 F (36.6 C), temperature source Oral, resp. rate 20, height 6' (1.829 m), weight 195 lb 15.8 oz (88.9 kg), SpO2 96.00%.  LVAD INTERROGATION:  HeartMate II LVAD: Flow 4.2 liters/min, speed 8600, power 4.8, PI 6.9   Labs: Lab  Results  Component Value Date   WBC 7.3 11/18/2013   HGB 9.4* 11/18/2013   HCT 27.4* 11/18/2013   MCV 89.3 11/18/2013   PLT 239 11/18/2013     Recent Labs Lab 11/18/13 0420  NA 137  K 4.1  CL 102  CO2 26  BUN 22  CREATININE 1.41*  CALCIUM 8.0*  PROT 4.9*  BILITOT 0.5  ALKPHOS 92  ALT 15  AST 21  GLUCOSE 87   Lab Results  Component Value Date   CHOL 118 11/28/2012   HDL 50.80 11/28/2012   LDLCALC 58 11/28/2012   TRIG 48.0 11/28/2012   BNP (last 3 results)  Recent Labs  11/02/13 0500 11/09/13 0500 11/16/13 0529  PROBNP 5988.0* 3387.0* 3884.0*    Diagnostic Studies/Procedures   No results found.  Discharge Medications     Medication List    STOP taking these medications       carvedilol 6.25 MG tablet  Commonly known as:  COREG     enoxaparin 100 MG/ML injection  Commonly known as:  LOVENOX      TAKE these medications       amiodarone 200 MG tablet  Commonly known as:  PACERONE  Take 1 tablet (200 mg total) by mouth 2 (two) times daily.     aspirin 81 MG tablet  Take 81 mg by mouth daily.     atorvastatin 80 MG tablet  Commonly known as:  LIPITOR  Take 80 mg by mouth daily.     CALCIUM-MAGNESIUM-ZINC PO  Take 3 tablets by mouth at bedtime. 1000 mg calcium, 400 mg magnesium, 25 mg zinc     Digoxin 62.5 MCG Tabs  Take 0.0625 mg by mouth daily.     DSS 100 MG Caps  Take 200 mg by mouth daily as needed for mild constipation.     EYE VITAMINS PO  Take 1 capsule by mouth 2 (two) times daily.     ferrous gluconate 324 MG tablet  Commonly known as:  FERGON  Take 1 tablet (324 mg total) by mouth 3 (three) times daily with meals.     furosemide 20 MG tablet  Commonly known as:  LASIX  Take 1 tablet (20 mg total) by mouth as needed. Take 20 mg for weight 200 pounds or greater     levothyroxine 112 MCG tablet  Commonly known as:  SYNTHROID  Take 1 tablet (112 mcg total) by mouth daily.     midodrine 5 MG tablet  Commonly known as:  PROAMATINE   Take 1 tablet (5 mg total) by mouth 3 (three) times daily with meals.     milrinone 20 MG/100ML Soln infusion  Commonly known as:  PRIMACOR  Inject 23.15 mcg/min into the vein continuous.  multivitamin tablet  Take 1 tablet by mouth daily.     nitroGLYCERIN 0.4 MG SL tablet  Commonly known as:  NITROSTAT  Place 0.4 mg under the tongue every 5 (five) minutes as needed for chest pain. For chest pain     pantoprazole 40 MG tablet  Commonly known as:  PROTONIX  Take 1 tablet (40 mg total) by mouth daily.     ranolazine 500 MG 12 hr tablet  Commonly known as:  RANEXA  Take 1 tablet (500 mg total) by mouth 2 (two) times daily.     traMADol 50 MG tablet  Commonly known as:  ULTRAM  Take 1 tablet (50 mg total) by mouth every 6 (six) hours as needed for moderate pain.     warfarin 2 MG tablet  Commonly known as:  COUMADIN  Take 1 tablet (2 mg total) by mouth daily at 6 PM.        Disposition   The patient will be discharged in stable condition to home. Discharge Orders   Future Appointments Provider Department Dept Phone   11/24/2013 10:00 AM [facility] [place] [phone]   03/30/2014 1:00 PM [facility] [place] [address] [phone]   03/30/2014 2:00 PM [facility] [address] [phone]   03/30/2014 2:30 PM Mal Misty, MD [facility] [phone]   Future Orders Complete By Expires   Contraindication to ACEI at discharge  As directed    Scheduling Instructions:     Hypotension   Diet - low sodium heart healthy  As directed    Heart Failure patients record your daily weight using the same scale at the same time of day  As directed    Increase activity slowly  As directed      Follow-up Information   Follow up with Glori Bickers, MD On 11/24/2013. (at 10:00 [place])    Specialty:  Cardiology   Contact information:    [place] [address] [phone]         Duration of Discharge Encounter: Greater than 35 minutes   Signed, CLEGG,AMY  11/18/2013, 11:21 AM  Patient seen and examined with Darrick Grinder, NP. We discussed all aspects of the encounter. I agree with the assessment and plan as stated above. I have edited the note with my changes.   Gusta Marksberry,MD 11:23 PM

## 2013-11-17 NOTE — Progress Notes (Signed)
Patient ID: [name], male   DOB: 1941-07-28, 73 y.o.   MRN: [id number] HeartMate 2 Rounding Note  Subjective:    [name] is a 73 yo man with an ischemic CM s/p ICD implant, chronic systolic HF (EF 32-44%), paroxysmal VT s/p ablation (09/2011) and repeat (06/2013), CAD, PVD (s/p R fem pop BPG 01/2010; s/p L CFA BPG 05/2002), AAA (s/p repair 04/2002), CAS (s/p L CEA 2008), HTN, arthritis, and TIA. Recently admitted 12/8-12/11/14 for syncopal episode from inappropriate ATP which accelerated SVT>>VT with failed ATP and then spontaneous termination of VT. During hospital stay had repeat RHC, hemodynamics were stable and had TEE-DC-CV of AFL 10/15/13 which was successful. He was presented to our [facility] team and felt to be good LVAD candidate and then presented at [facility] transplant listing conference and they felt he was not a candidate for transplant, but good candidate for LVAD under DT criteria.   POD 28 HeartMate II LVAD   He remains on Milrinone 0.25. Co-ox 60% this am.   RHC: 11/16/2013  Done on milrinone 0.25 mcg/kg/min and VAD speed 8600  RA = 3  RV = 29/1/5  PA = 29/12 (17)  PCW = 5  Fick cardiac output/index = 6.3/3.0  Thermodilution CO/CI = 4.6/2.2  PVR = 2.6 WU  FA sat = 92%  PA sat = 49%, 54%   Receiving another unit prbc's now.  Just ambulated and felt well with no significant change in PI.  LVAD INTERROGATION:  HeartMate II LVAD:  Flow 4.5 liters/min, speed 8600, power 4.6, PI 7.4.   Objective:    Vital Signs:   Temp:  [97.8 F (36.6 C)-98.7 F (37.1 C)] 98.6 F (37 C) (01/13 1704) Pulse Rate:  [75-94] 90 (01/13 1704) SpO2:  [93 %-95 %] 95 % (01/13 1704) Weight:  [87.363 kg (192 lb 9.6 oz)] 87.363 kg (192 lb 9.6 oz) (01/13 0649) Last BM Date: 11/14/13 Mean arterial Pressure 90  Intake/Output:   Intake/Output Summary (Last 24 hours) at 11/17/13 1813 Last data filed at 11/17/13 1705  Gross per 24 hour  Intake 1243.88 ml  Output   1500 ml  Net -256.12 ml      Physical Exam: General: Awake and alert. Good spirits HEENT: normal  Cor: Faint heart sounds , LVAD hum present.  Lungs: clear, left base atelectasis or effusion appears resolved.  Abdomen: soft, nontender, nondistended. No hepatosplenomegaly. No bruits or masses. Active bowel sounds.  Driveline: C/D/I; securement device intact and driveline incorporated  Extremities: mild peripheral edema in feet and lower legs  Neuro: alert & orientedx3, cranial nerves grossly intact. moves all 4 extremities w/o difficulty. Affect pleasant    Telemetry: A-paced 75  Labs: Basic Metabolic Panel:  Recent Labs Lab 11/13/13 0535 11/14/13 0500 11/15/13 0500 11/16/13 0529 11/17/13 0518  NA 137 136* 138 136* 139  K 4.0 4.0 4.3 4.1 4.1  CL 100 100 101 100 103  CO2 29 28 28 27 27   GLUCOSE 97 90 90 92 93  BUN 23 23 23 22 21   CREATININE 1.34 1.31 1.42* 1.41* 1.34  CALCIUM 7.8* 8.0* 7.9* 8.0* 7.8*    Liver Function Tests:  Recent Labs Lab 11/13/13 0535 11/14/13 0500 11/15/13 0500 11/16/13 0529 11/17/13 0518  AST 23 26 24 23 21   ALT 19 20 18 18 15   ALKPHOS 86 92 91 88 90  BILITOT 0.3 0.4 0.4 0.4 0.4  PROT 4.6* 4.9* 4.9* 4.9* 4.9*  ALBUMIN 2.0* 2.1* 2.1* 2.2* 2.1*  No results found for this basename: LIPASE, AMYLASE,  in the last 168 hours No results found for this basename: AMMONIA,  in the last 168 hours  CBC:  Recent Labs Lab 11/14/13 0500 11/15/13 0500 11/15/13 1900 11/16/13 0529 11/17/13 0518  WBC 7.8 8.1 8.2 7.1 8.2  HGB 9.8* 8.8* 8.3* 8.2* 8.9*  HCT 30.1* 27.1* 25.5* 24.1* 26.7*  MCV 89.3 90.6 92.4 90.3 91.1  PLT 249 267 243 241 258    INR:  Recent Labs Lab 11/13/13 0535 11/14/13 0500 11/15/13 0500 11/16/13 0529 11/17/13 0518  INR 2.48* 2.61* 2.38* 2.53* 2.48*   LDH: 350  Other results:  EKG:   Imaging:  No results found.   Medications:     Scheduled Medications: . amiodarone  200 mg Oral BID  . aspirin EC  325 mg Oral Daily  .  bisacodyl  10 mg Oral Daily   Or  . bisacodyl  10 mg Rectal Daily  . digoxin  0.0625 mg Oral Daily  . docusate sodium  200 mg Oral Daily  . feeding supplement (ENSURE COMPLETE)  237 mL Oral BID BM  . ferrous gluconate  324 mg Oral TID WC  . guaiFENesin  600 mg Oral BID  . midodrine  5 mg Oral TID WC  . pantoprazole  40 mg Oral Daily  . ranolazine  500 mg Oral BID  . sodium chloride  3 mL Intravenous Q12H  . sodium chloride  3 mL Intravenous Q12H  . warfarin  2 mg Oral q1800  . Warfarin - Pharmacist Dosing Inpatient   Does not apply q1800     Infusions: . sodium chloride 20 mL/hr at 10/22/13 0800  . sodium chloride 15 mL/hr at 11/15/13 1219  . sodium chloride 250 mL (10/20/13 0600)  . sodium chloride    . milrinone 0.25 mcg/kg/min (11/17/13 1351)     PRN Medications:  sodium chloride, acetaminophen, diclofenac sodium, ondansetron (ZOFRAN) IV, sodium chloride, sodium chloride, sodium chloride, sodium chloride, traMADol   Assessment:   1) Chronic systolic HF, Class IV, EF 20-25%  --s/p HM II VAD implant 12/15  2) NICM/ICM  3) Recurrent Vtach  - ablation 09/2011 and 06/2013  4) Atrial flutter  - s/p DC-CV 10/15/2013  5) CAD with recanalized occluded RCA with left to right collat.  6) PAD: s/p aorto-bifem, bilateral renal artery bypass, bilateral fem-pop, and left CEA.  7) Severe RV dysfunction by 2D echo 10/31/2013 8) Left pleural effusion with left lung atelectasis  9) Anemia 10) RHC 11/17/2011 shows normal right heart pressures and low PCW on Milrinone 0.25    Plan/Discussion:     He is doing well after transfusion and decreasing diuresis for intravascular volume depletion with low filling pressures. He has no visible blood loss and anemia could be due to blood draws, postop marrow dysfunction, low level hemolysis, occult GI loss.  Hopefully he can go home tomorrow if no changes.  I reviewed the LVAD parameters from today, and compared the results to the patient's  prior recorded data.  No programming changes were made.  The LVAD is functioning within specified parameters.  The patient performs LVAD self-test daily.  LVAD interrogation was negative for any significant power changes, alarms or PI events/speed drops.  LVAD equipment check completed and is in good working order.  Back-up equipment present.   LVAD education done on emergency procedures and precautions and reviewed exit site care.  Length of Stay: 30  BARTLE,BRYAN K 11/17/2013, 6:13 PM  VAD  Team Pager [phone] (7am - 7am)

## 2013-11-17 NOTE — Progress Notes (Signed)
1330 Pt has walked with RN and is to walk with PT this pm. We will continue to follow. Graylon Good RN BSN 11/17/2013 1:30 PM

## 2013-11-17 NOTE — Progress Notes (Addendum)
Occupational Therapy Treatment Patient Focus of session on AE education  And [place] Name: [name] MRN: [id number] DOB: 1941/08/11 Today's Date: 11/17/2013 Time: 1535-1600 OT Time Calculation (min): 25 min  OT Assessment / Plan / Recommendation  History of present illness Pt admit for LVAD placement. Has been symptomatic with drops in PI (afib with aberrancy; not vtach) and treated for Rt BPPV successfully.   OT comments  Focus of session on AE  And DME education to increase independence and safety with ADL. Pt verbalized understanding. Pt demonstrates independence with LVAD equipment regarding ADL. Pt ready for D/C from mobility standpoint with 24/7 S. Pt needs 3 in 1 for D/C home.   Follow Up Recommendations  Home health OT;Supervision/Assistance - 24 hour    Barriers to Discharge       Equipment Recommendations  3 in 1 bedside comode    Recommendations for Other Services    Frequency Min 3X/week   Progress towards OT Goals Progress towards OT goals: Progressing toward goals  Plan Discharge plan remains appropriate    Precautions / Restrictions Precautions Precautions: Sternal;Fall Precaution Comments: LVAD; closely monitor PI as dropping when changing positions Required Braces or Orthoses: Other Brace/Splint Other Brace/Splint: strap for controller; vest for batteries; needs to take bag with back up supplies with him whenever he leaves his room Restrictions Weight Bearing Restrictions: No   Pertinent Vitals/Pain Stable. No events    ADL  ADL Comments: Educated pt on availability and use of AE for ADL. Pt is able to be @ set up level with use of AE. discussed use of DME for toileitng and bathing. Discussed importance of proper set up for ADL and timing of ADL to eliminate need for frequency of switching on/off monitor system. Instructed pt to try to simulate home environment and encourgaed pt to do more of his ADL with S of caretakers.     OT Diagnosis:    OT Problem  List:   OT Treatment Interventions:     OT Goals(current goals can now be found in the care plan section) Acute Rehab OT Goals Patient Stated Goal: to get better OT Goal Formulation: With patient Time For Goal Achievement: 11/19/13 Potential to Achieve Goals: Good ADL Goals Pt Will Perform Grooming: with supervision;standing Pt Will Perform Upper Body Bathing: with set-up;sitting Pt Will Perform Lower Body Bathing: with supervision;sit to/from stand Pt Will Perform Upper Body Dressing: with set-up;sitting Pt Will Perform Lower Body Dressing: with supervision;sit to/from stand Pt Will Transfer to Toilet: with supervision;ambulating;bedside commode;regular height toilet Pt Will Perform Toileting - Clothing Manipulation and hygiene: with supervision;sit to/from stand Additional ADL Goal #1: Pt will be independent with managing LVAD equipment during all BADLs  Additional ADL Goal #2: Pt will be independent with sternal precautions during all BADLs   Visit Information  Last OT Received On: 11/18/12 History of Present Illness: Pt admit for LVAD placement. Has been symptomatic with drops in PI (afib with aberrancy; not vtach) and treated for Rt BPPV successfully.    Subjective Data      Prior Functioning       Cognition  Cognition Arousal/Alertness: Awake/alert Behavior During Therapy: WFL for tasks assessed/performed Overall Cognitive Status: Within Functional Limits for tasks assessed    Mobility       Exercises      Balance    End of Session OT - End of Session Activity Tolerance: Patient tolerated treatment well Patient left: in bed;with call bell/phone within reach Nurse Communication: Mobility  status  GO     [name],HILLARY 11/17/2013, 4:06 PM [facility], OTR/L  [phone] 11/17/2013

## 2013-11-17 NOTE — Progress Notes (Signed)
Physical Therapy Treatment Patient Details Name: [name] MRN: [id number] DOB: 1940/12/30 Today's Date: 11/17/2013 Time: 2458-0998 PT Time Calculation (min): 29 min  PT Assessment / Plan / Recommendation  History of Present Illness Pt admit for LVAD placement. Has been symptomatic with drops in PI (afib with aberrancy; not vtach) and treated for Rt BPPV successfully.   PT Comments   Pt looks great! Tolerated ambulation very well with PI initially down to 3.0, but mid 3's and up to 4 while walking. Pt has rollator he will use at home and has no difficulty with use or concerns. From a mobility standpoint he is ready to d/c home. Feel HHPT remains appropriate as pt still with decr balance (requiring rollator) and wants to return to ambulating independently if possible.   Follow Up Recommendations  Home health PT;Supervision/Assistance - 24 hour     Does the patient have the potential to tolerate intense rehabilitation     Barriers to Discharge        Equipment Recommendations  None recommended by PT [name] obtained rollator)    Recommendations for Other Services    Frequency Min 3X/week   Progress towards PT Goals Progress towards PT goals: Progressing toward goals  Plan Current plan remains appropriate    Precautions / Restrictions Precautions Precautions: Sternal;Fall Precaution Comments: LVAD; closely monitor PI as dropping when changing positions Required Braces or Orthoses: Other Brace/Splint Other Brace/Splint: strap for controller; vest for batteries; needs to take bag with back up supplies with him whenever he leaves his room Restrictions Weight Bearing Restrictions: No Other Position/Activity Restrictions: sternal precautions   Pertinent Vitals/Pain See above    Mobility  Bed Mobility Overal bed mobility: Modified Independent General bed mobility comments: HOB elevated 15; no cues for sequencing Transfers Overall transfer level: Needs assistance Equipment  used: 4-wheeled walker Transfers: Sit to/from Stand Sit to Stand: Supervision General transfer comment: from bed; uses momentum appropriately and maintains sternal precautions Ambulation/Gait Ambulation/Gait assistance: Supervision Ambulation Distance (Feet): 300 Feet (standing breaks to check PI numbers) Assistive device: 4-wheeled walker (his own from home) Gait Pattern/deviations: Step-through pattern Gait velocity: community ambulator Gait velocity interpretation: at or above normal speed for age/gender    Exercises Other Exercises Other Exercises: Patient able to ask for all equipment needed to switch to batteries.  Able to make switch independently.   Asked for bag to take with [place] for ambulation.  Patient able to list supplies needed in bag.  Patient able to switch off of batteries at end of session. Other Exercises: Pt independently monitoring PI throughout session.    PT Diagnosis:    PT Problem List:   PT Treatment Interventions:     PT Goals (current goals can now be found in the care plan section) Acute Rehab PT Goals Patient Stated Goal: go home tomorrow!  Visit Information  Last PT Received On: 11/17/13 Assistance Needed: +1 History of Present Illness: Pt admit for LVAD placement. Has been symptomatic with drops in PI (afib with aberrancy; not vtach) and treated for Rt BPPV successfully.    Subjective Data  Patient Stated Goal: go home tomorrow!   Cognition  Cognition Arousal/Alertness: Awake/alert Behavior During Therapy: WFL for tasks assessed/performed Overall Cognitive Status: Within Functional Limits for tasks assessed    Balance     End of Session PT - End of Session Activity Tolerance: Patient tolerated treatment well (dizziness, weak in legs) Patient left: in chair;with call bell/phone within reach Nurse Communication: Mobility status   GP  [name] 11/17/2013, 6:10 PM Pager [phone]

## 2013-11-17 NOTE — Progress Notes (Signed)
ANTICOAGULATION CONSULT NOTE - Follow Up Consult  Pharmacy Consult for Coumadin Indication: atrial fibrillation / s/p LVAD 12/15  Allergies  Allergen Reactions  . Demerol [Meperidine]     Paralysis. Could only move eyes.  . Meperidine Hcl Other (See Comments)    "CAN'T MOVE" CATATONIC PER PT.  [name] Opium Other (See Comments)    "CAN'T MOVE" CATATONIC PER PT.    Patient Measurements: Height: 6' (182.9 cm) Weight: 192 lb 9.6 oz (87.363 kg) IBW/kg (Calculated) : 77.6  Vital Signs: Temp: 98.7 F (37.1 C) (01/13 0431) Temp src: Oral (01/13 0431) Pulse Rate: 75 (01/13 0431)  Labs:  Recent Labs  11/15/13 0500 11/15/13 1900 11/16/13 0529 11/17/13 0518  HGB 8.8* 8.3* 8.2* 8.9*  HCT 27.1* 25.5* 24.1* 26.7*  PLT 267 243 241 258  LABPROT 25.2*  --  26.4* 26.0*  INR 2.38*  --  2.53* 2.48*  CREATININE 1.42*  --  1.41* 1.34    Estimated Creatinine Clearance: 53.1 ml/min (by C-G formula based on Cr of 1.34).   Medical History: Past Medical History  Diagnosis Date  . CAD (coronary artery disease)     s/p MI 1982;  s/p DES to CFX 2011;  s/p NSTEMI 4/12 in setting of VTach;  cath 7/12:  LM ok, LAD mid 30-40%, Dx's with 40%, mCFX stent patent, prox to mid RCA occluded with R to R and L to R collats.  Medical Tx was continued  . Bradycardia   . Ventricular tachycardia     s/p AICD;   h/o ICD shock;  Amiodarone Rx. S/P ablation in 2012  . 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 HTN (hypertension)   . HLD (hyperlipidemia)   . Cytopenia   . Hypothyroidism   . Renal insufficiency   . Claudication   . Chronic systolic heart failure   . Ischemic cardiomyopathy     echo 7/12:  EF 25-30%, mild AI, mod MR, mod LAE  . TIA (transient ischemic attack) 2001  . CVD (cerebrovascular disease)     left CEA 2008  . Stroke   . Anemia   . Inappropriate therapy  from implantable cardioverter-defibrillator 04/02/2013    ATP for sinus tach  SVT wavelet identified SVT but was no  passive   . Myocardial infarction 1992  . Anginal pain   . Automatic implantable cardioverter-defibrillator in situ     Medtronic Evera device serial number [id number]    . Sleep apnea     denies wearing mask (05/25/2013)  . Arthritis     "a little in my knees" (05/25/2013)  . Melanoma of ear 2013    "near left ear" (05/25/2013)    Assessment: 73 yo M with HF s/p LVAD placement 12/15.  Afib > SR 70s s/p DCCV 12/31. Current tx amiodarone, ranolazine, digoxin.   INR 2.48 today.  H/H had improved after transfusion 1/10 but trended down again received 1ut PRBC 1/12 and will receive another unit 1/13. PLTC stable 240s, h/h 8.9/26.7. Currently on Iron TID.   Goal of Therapy:  INR 2-3 Monitor platelets by anticoagulation protocol: Yes   Plan:  Continue Coumadin 2mg  daily Daily INR  Bonnita Nasuti Pharm.D. CPP, BCPS Clinical Pharmacist [phone] 11/17/2013 10:23 AM

## 2013-11-17 NOTE — Progress Notes (Signed)
Patient ID: [name], male   DOB: 07-09-1941, 73 y.o.   MRN: [id number]  HeartMate 2 Rounding Note  Subjective:    [name] is a 73 yo man with an ischemic CM s/p ICD implant, chronic systolic HF (EF 0000000), paroxysmal VT s/p ablation (09/2011) and repeat (06/2013), CAD, PVD (s/p R fem pop BPG 01/2010; s/p L CFA BPG 05/2002), AAA (s/p repair 04/2002), CAS (s/p L CEA 2008), HTN, arthritis, and TIA. Recently admitted 12/8-12/11/14 for syncopal episode from inappropriate ATP which accelerated SVT>>VT with failed ATP and then spontaneous termination of VT. During hospital stay had repeat RHC, hemodynamics were stable and had TEE-DC-CV of AFL 10/15/13 which was successful. He was presented to our [facility] team and felt to be good LVAD candidate and then presented at [facility] transplant listing conference and they felt he was not a candidate for transplant, but good candidate for LVAD under DT criteria.   Echo 12/27: LV is small with EF 15%. RV not significantly dilated but severely HK. 12/31: TEE-guided DCCV back to NSR.   Weak yesterday with PI dropping particularly when standing. Weight down to 192; will hold diuretics. Co-ox improved at 59.8 this morning. MAP 80 - 94.  Hgb 8.2 > 8.9 after one unit PC yesteday; LDH remains stable 337>350   Co-ox 55%->53%->56%->60%>62%>59>64% (milrinone turned down to 0.125)>52%>48% (mirlinone back to 0.25) >62.9%>79%>50%>49.7>59.8  RHC yesterday: done on milrinone 0.25 mcg/kg/min and VAD speed 8600: RA = 3  RV = 29/1/5  PA = 29/12 (17)  PCW = 5  Fick cardiac output/index = 6.3/3.0  Thermodilution CO/CI = 4.6/2.2  PVR = 2.6 WU  FA sat = 92%  PA sat = 49%, 54%   LVAD INTERROGATION:  HeartMate II LVAD:  Flow 4.5  liters/min, speed 8600, power 4.6,  PI 7.4 Alarms: none Events: 11 PI events        Objective:    Vital Signs:   Temp:  [97.1 F (36.2 C)-98.7 F (37.1 C)] 98.7 F (37.1 C) (01/13 0431) Pulse Rate:  [75-94] 75 (01/13 0431) Resp:  [20]  20 (01/12 1551) SpO2:  [93 %-95 %] 93 % (01/13 0431) Weight:  [87.363 kg (192 lb 9.6 oz)] 87.363 kg (192 lb 9.6 oz) (01/13 0649) Last BM Date: 11/14/13  Intake/Output:   Intake/Output Summary (Last 24 hours) at 11/17/13 1018 Last data filed at 11/17/13 0600  Gross per 24 hour  Intake 725.91 ml  Output    900 ml  Net -174.09 ml     Physical Exam: General: Sitting in chair, feels good HEENT: normal  Neck: supple. JVP 7 Carotids 2+ bilat; no bruits. No lymphadenopathy or thryomegaly appreciated.  Cor: Faint heart sounds , LVAD hum present.  Lungs: CTA Abdomen: soft, nontender, nondistended. No hepatosplenomegaly. No bruits or masses. Active bowel sounds.  Driveline: C/D/I; securement device intact and driveline incorporated  Extremities: warm. TED hose in place; trace edema' RUE PICC 2L Neuro: alert & orientedx3, cranial nerves grossly intact. moves all 4 extremities w/o difficulty. Affect pleasant   Telemetry: a-paced, 75  Labs: Basic Metabolic Panel:  Recent Labs Lab 11/13/13 0535 11/14/13 0500 11/15/13 0500 11/16/13 0529 11/17/13 0518  NA 137 136* 138 136* 139  K 4.0 4.0 4.3 4.1 4.1  CL 100 100 101 100 103  CO2 29 28 28 27 27   GLUCOSE 97 90 90 92 93  BUN 23 23 23 22 21   CREATININE 1.34 1.31 1.42* 1.41* 1.34  CALCIUM 7.8* 8.0* 7.9* 8.0* 7.8*  Liver Function Tests:  Recent Labs Lab 11/13/13 0535 11/14/13 0500 11/15/13 0500 11/16/13 0529 11/17/13 0518  AST 23 26 24 23 21   ALT 19 20 18 18 15   ALKPHOS 86 92 91 88 90  BILITOT 0.3 0.4 0.4 0.4 0.4  PROT 4.6* 4.9* 4.9* 4.9* 4.9*  ALBUMIN 2.0* 2.1* 2.1* 2.2* 2.1*   No results found for this basename: LIPASE, AMYLASE,  in the last 168 hours No results found for this basename: AMMONIA,  in the last 168 hours  CBC:  Recent Labs Lab 11/14/13 0500 11/15/13 0500 11/15/13 1900 11/16/13 0529 11/17/13 0518  WBC 7.8 8.1 8.2 7.1 8.2  HGB 9.8* 8.8* 8.3* 8.2* 8.9*  HCT 30.1* 27.1* 25.5* 24.1* 26.7*  MCV 89.3  90.6 92.4 90.3 91.1  PLT 249 267 243 241 258    INR:  2.48  Recent Labs Lab 11/13/13 0535 11/14/13 0500 11/15/13 0500 11/16/13 0529 11/17/13 0518  INR 2.48* 2.61* 2.38* 2.53* 2.48*   Other results:    Imaging: No results found.   Medications:     Scheduled Medications: . amiodarone  200 mg Oral BID  . aspirin EC  325 mg Oral Daily  . bisacodyl  10 mg Oral Daily   Or  . bisacodyl  10 mg Rectal Daily  . digoxin  0.0625 mg Oral Daily  . docusate sodium  200 mg Oral Daily  . feeding supplement (ENSURE COMPLETE)  237 mL Oral BID BM  . ferrous gluconate  324 mg Oral TID WC  . guaiFENesin  600 mg Oral BID  . midodrine  5 mg Oral TID WC  . pantoprazole  40 mg Oral Daily  . potassium chloride  20 mEq Oral Daily  . ranolazine  500 mg Oral BID  . sodium chloride  3 mL Intravenous Q12H  . sodium chloride  3 mL Intravenous Q12H  . warfarin  2 mg Oral q1800  . Warfarin - Pharmacist Dosing Inpatient   Does not apply q1800    Infusions: . sodium chloride 20 mL/hr at 10/22/13 0800  . sodium chloride 15 mL/hr at 11/15/13 1219  . sodium chloride 250 mL (10/20/13 0600)  . sodium chloride    . milrinone 0.25 mcg/kg/min (11/16/13 2057)    PRN Medications: sodium chloride, acetaminophen, diclofenac sodium, ondansetron (ZOFRAN) IV, sodium chloride, sodium chloride, sodium chloride, sodium chloride, traMADol   Assessment:   1) Chronic systolic HF, Class IV, EF 20-25%  --s/p HM II VAD implant 12/15  2) NICM/ICM  3) Recurrent Vtach  - ablation 09/2011 and 06/2013  4) Atrial flutter  - s/p DC-CV 10/15/2013  - Atrial fibrillation recurred post-op.  - TEE-guided DCCV 12/31 5) CAD with recanalized occluded RCA with left to right collat.  6) PAD: s/p aorto-bifem, bilateral renal artery bypass, bilateral fem-pop, and left CEA.  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  8) Pleural effusion s/p thoracentesis 1/4    Plan/Discussion:    RHC numbers reviewed. He is dry. Hgb trending  down again. Will give one more unit of blood today. Hold diuretics. Continue ambulation. Will shoot for d/c tomorrow.   Will likely need to see GI as outpatient.   Maintaining NSR on amio/ranexa.    Coumadin per pharmacy.   I reviewed the LVAD parameters from today, and compared the results to the patient's prior recorded data.  No programming changes were made.  The LVAD is functioning within specified parameters.  The patient performs LVAD self-test daily.  LVAD interrogation was negative  for any significant power changes, alarms or PI events/speed drops.  LVAD equipment check completed and is in good working order.  Back-up equipment present.   LVAD education done on emergency procedures and precautions and reviewed exit site care.  Length of Stay: [place], 10:18 AM

## 2013-11-17 NOTE — Progress Notes (Signed)
Pt ambulated 350 feet with rolling walker and RN; backup equipment with RN while ambulating; PI down to 1.8 upon standing from chair; PI up to 2.4 before pt proceeded to ambulate; PI up to 3.8 during ambulation; pt assisted back to room to chair; PI up to 7.7 once in chair; call bell w/i reach; will cont. To monitor.

## 2013-11-17 NOTE — Progress Notes (Signed)
Pt ambulated 150 feet with rolling walker and [name]; pt tolerated ambulation well; PI down to 2.2 during ambulation; pt sitting in chair now; PI 7.8; will cont. To monitor.

## 2013-11-18 LAB — CBC
HCT: 27.4 % — ABNORMAL LOW (ref 39.0–52.0)
HEMOGLOBIN: 9.4 g/dL — AB (ref 13.0–17.0)
MCH: 30.6 pg (ref 26.0–34.0)
MCHC: 34.3 g/dL (ref 30.0–36.0)
MCV: 89.3 fL (ref 78.0–100.0)
Platelets: 239 10*3/uL (ref 150–400)
RBC: 3.07 MIL/uL — ABNORMAL LOW (ref 4.22–5.81)
RDW: 19.1 % — AB (ref 11.5–15.5)
WBC: 7.3 10*3/uL (ref 4.0–10.5)

## 2013-11-18 LAB — TYPE AND SCREEN
ABO/RH(D): O POS
Antibody Screen: NEGATIVE
Unit division: 0

## 2013-11-18 LAB — COMPREHENSIVE METABOLIC PANEL
ALK PHOS: 92 U/L (ref 39–117)
ALT: 15 U/L (ref 0–53)
AST: 21 U/L (ref 0–37)
Albumin: 2.2 g/dL — ABNORMAL LOW (ref 3.5–5.2)
BUN: 22 mg/dL (ref 6–23)
CO2: 26 meq/L (ref 19–32)
Calcium: 8 mg/dL — ABNORMAL LOW (ref 8.4–10.5)
Chloride: 102 mEq/L (ref 96–112)
Creatinine, Ser: 1.41 mg/dL — ABNORMAL HIGH (ref 0.50–1.35)
GFR, EST AFRICAN AMERICAN: 55 mL/min — AB (ref >90)
GFR, EST NON AFRICAN AMERICAN: 48 mL/min — AB (ref >90)
GLUCOSE: 87 mg/dL (ref 70–99)
Potassium: 4.1 mEq/L (ref 3.7–5.3)
SODIUM: 137 meq/L (ref 137–147)
Total Bilirubin: 0.5 mg/dL (ref 0.3–1.2)
Total Protein: 4.9 g/dL — ABNORMAL LOW (ref 6.0–8.3)

## 2013-11-18 LAB — CARBOXYHEMOGLOBIN
Carboxyhemoglobin: 2.9 % — ABNORMAL HIGH (ref 0.5–1.5)
Methemoglobin: 0.9 % (ref 0.0–1.5)
O2 Saturation: 60.2 %
TOTAL HEMOGLOBIN: 11.1 g/dL — AB (ref 13.5–18.0)

## 2013-11-18 LAB — PROTIME-INR
INR: 2.8 — AB (ref 0.00–1.49)
PROTHROMBIN TIME: 28.5 s — AB (ref 11.6–15.2)

## 2013-11-18 LAB — LACTATE DEHYDROGENASE: LDH: 365 U/L — ABNORMAL HIGH (ref 94–250)

## 2013-11-18 MED ORDER — MILRINONE IN DEXTROSE 20 MG/100ML IV SOLN: 0.2500 ug/kg/min | INTRAVENOUS | Status: DC

## 2013-11-18 MED ORDER — DSS 100 MG PO CAPS: 200.0000 mg | ORAL_CAPSULE | Freq: Every day | ORAL | Status: DC | PRN

## 2013-11-18 MED ORDER — FUROSEMIDE 20 MG PO TABS: 20.0000 mg | ORAL_TABLET | ORAL | Status: DC | PRN

## 2013-11-18 MED ORDER — RANOLAZINE ER 500 MG PO TB12: 500.0000 mg | ORAL_TABLET | Freq: Two times a day (BID) | ORAL | Status: DC

## 2013-11-18 MED ORDER — DIGOXIN 62.5 MCG PO TABS
0.0625 mg | ORAL_TABLET | Freq: Every day | ORAL | Status: DC
Start: 2013-11-18 — End: 2013-12-17

## 2013-11-18 MED ORDER — FERROUS GLUCONATE 324 (38 FE) MG PO TABS: 324.0000 mg | ORAL_TABLET | Freq: Three times a day (TID) | ORAL | Status: DC

## 2013-11-18 MED ORDER — TRAMADOL HCL 50 MG PO TABS: 50.0000 mg | ORAL_TABLET | Freq: Four times a day (QID) | ORAL | Status: DC | PRN

## 2013-11-18 MED ORDER — PANTOPRAZOLE SODIUM 40 MG PO TBEC: 40.0000 mg | DELAYED_RELEASE_TABLET | Freq: Every day | ORAL | Status: DC

## 2013-11-18 MED ORDER — WARFARIN SODIUM 2 MG PO TABS: 2.0000 mg | ORAL_TABLET | Freq: Every day | ORAL | Status: DC

## 2013-11-18 MED ORDER — AMIODARONE HCL 200 MG PO TABS: 200.0000 mg | ORAL_TABLET | Freq: Two times a day (BID) | ORAL | Status: DC

## 2013-11-18 MED ORDER — MIDODRINE HCL 5 MG PO TABS: 5.0000 mg | ORAL_TABLET | Freq: Three times a day (TID) | ORAL | Status: DC

## 2013-11-18 MED ORDER — ASPIRIN EC 81 MG PO TBEC
81.0000 mg | DELAYED_RELEASE_TABLET | Freq: Every day | ORAL | Status: DC
  Administered 2013-11-18: 81 mg via ORAL
  Filled 2013-11-18: qty 1

## 2013-11-18 NOTE — Progress Notes (Signed)
Assessment unchanged. Discussed D/C instructions with pt and significant other including new medications, f/u appointments, HF and LVAD education. Pt instructed to weigh himself daily with same scale, at the same time, and with same amount of clothes on preferably after emptying his bladder. Pt instructed to call MD for signs and symptoms of fluid overload. Pt instructed to call VAD pager for any questions, to inform staff of changes in numbers or driveline site, and to alert staff of any alarms that occur.  Pt and significant other verbalized understanding. Home health RNs hooked pt up to home milrinone infusion. Tele removed. Pt left with VAD equipment and belongings accompanied by RN.

## 2013-11-18 NOTE — Progress Notes (Signed)
CSW met with patient and caregiver [name] at bedside. Patient was sitting up in chair at bedside with a big smile. He reports d/c home today. [name] (SO) states she is ready for his return home and home is prepared for his needs. Patient verbalizes understanding of d/c plan and homecare services for follow up. CSW provided support and encouragement and return call to HF clinic with any needs post discharge. CSW will follow up in clinic on 11/24/13 for post hospital visit. Patient appears to be motivated, in good spirits and looking forward to continued recovery at home.No further issues at this time. [name], [place]

## 2013-11-18 NOTE — Progress Notes (Signed)
  LVAD Exit Site:  Dressing change per VAD coordinator using sterile technique. Dressing removed, site  cleansed with Chlora prep applicators x 2, dried, and guaze dressing replaced with aquacel strip. The site has partial tissue ingrowth, small amount erythema under aquacel strip, no tenderness, drainage, or foul odor;  anchor device in place.  Driveline dressing will continue to be changed daily upon discharge home.  Encouraged pt and caregiver [name]) to call VAD team if any questions or concerns arise at home. Both verbalized agreement to do so.

## 2013-11-18 NOTE — Progress Notes (Signed)
ANTICOAGULATION CONSULT NOTE - Follow Up Consult  Pharmacy Consult for Coumadin Indication: atrial fibrillation / s/p LVAD 12/15  Allergies  Allergen Reactions  . Demerol [Meperidine]     Paralysis. Could only move eyes.  . Meperidine Hcl Other (See Comments)    "CAN'T MOVE" CATATONIC PER PT.  [name] Opium Other (See Comments)    "CAN'T MOVE" CATATONIC PER PT.    Patient Measurements: Height: 6' (182.9 cm) Weight: 195 lb 15.8 oz (88.9 kg) IBW/kg (Calculated) : 77.6  Vital Signs: Temp: 97.9 F (36.6 C) (01/14 0445) Temp src: Oral (01/14 0445) Pulse Rate: 75 (01/14 0800)  Labs:  Recent Labs  11/16/13 0529 11/17/13 0518 11/18/13 0420  HGB 8.2* 8.9* 9.4*  HCT 24.1* 26.7* 27.4*  PLT 241 258 239  LABPROT 26.4* 26.0* 28.5*  INR 2.53* 2.48* 2.80*  CREATININE 1.41* 1.34 1.41*    Estimated Creatinine Clearance: 50.4 ml/min (by C-G formula based on Cr of 1.41).   Medical History: Past Medical History  Diagnosis Date  . CAD (coronary artery disease)     s/p MI 1982;  s/p DES to CFX 2011;  s/p NSTEMI 4/12 in setting of VTach;  cath 7/12:  LM ok, LAD mid 30-40%, Dx's with 40%, mCFX stent patent, prox to mid RCA occluded with R to R and L to R collats.  Medical Tx was continued  . Bradycardia   . Ventricular tachycardia     s/p AICD;   h/o ICD shock;  Amiodarone Rx. S/P ablation in 2012  . 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 HTN (hypertension)   . HLD (hyperlipidemia)   . Cytopenia   . Hypothyroidism   . Renal insufficiency   . Claudication   . Chronic systolic heart failure   . Ischemic cardiomyopathy     echo 7/12:  EF 25-30%, mild AI, mod MR, mod LAE  . TIA (transient ischemic attack) 2001  . CVD (cerebrovascular disease)     left CEA 2008  . Stroke   . Anemia   . Inappropriate therapy from implantable cardioverter-defibrillator  04/02/2013    ATP for sinus tach  SVT wavelet identified SVT but was no  passive   . Myocardial infarction 1992  . Anginal pain   . Automatic implantable cardioverter-defibrillator in situ     Medtronic Evera device serial number [id number]    . Sleep apnea     denies wearing mask (05/25/2013)  . Arthritis     "a little in my knees" (05/25/2013)  . Melanoma of ear 2013    "near left ear" (05/25/2013)    Assessment: 73 yo M with HF s/p LVAD placement 12/15.  Afib > SR 70s s/p DCCV 12/31. Current tx amiodarone, ranolazine, digoxin.   INR 2.8 today.  H/H had improved after transfusion 1/10, 1/12 & 13. PLTC stable 240s, h/h up Currently on Iron TID.   Goal of Therapy:  INR 2-3 Monitor platelets by anticoagulation protocol: Yes   Plan:  Continue Coumadin 2mg  daily To decrease aspirin 81 mg Daily INR   Thank you for allowing pharmacy to be a part of this patients care team.  Rowe Robert Pharm.D., BCPS Clinical Pharmacist 11/18/2013 10:34 AM Pager: [phone] Phone: [phone]

## 2013-11-18 NOTE — Progress Notes (Signed)
Patient ID: [name], male   DOB: 04-08-1941, 73 y.o.   MRN: [id number]  HeartMate 2 Rounding Note  Subjective:    [name] is a 73 yo man with an ischemic CM s/p ICD implant, chronic systolic HF (EF 72-53%), paroxysmal VT s/p ablation (09/2011) and repeat (06/2013), CAD, PVD (s/p R fem pop BPG 01/2010; s/p L CFA BPG 05/2002), AAA (s/p repair 04/2002), CAS (s/p L CEA 2008), HTN, arthritis, and TIA. Recently admitted 12/8-12/11/14 for syncopal episode from inappropriate ATP which accelerated SVT>>VT with failed ATP and then spontaneous termination of VT. During hospital stay had repeat RHC, hemodynamics were stable and had TEE-DC-CV of AFL 10/15/13 which was successful. He was presented to our [facility] team and felt to be good LVAD candidate and then presented at [facility] transplant listing conference and they felt he was not a candidate for transplant, but good candidate for LVAD under DT criteria.   Echo 12/27: LV is small with EF 15%. RV not significantly dilated but severely HK. 12/31: TEE-guided DCCV back to NSR.   Echo 11/17/13:   Better day yesterday; walked 3 times total of 700 feet with PI drops to 3, but pt was asymptomatic.  Weight up 194; will continue to hold diuretics. Co-ox 60 this morning. MAP 74 - 94.  Hgb  8.9 > 9.4 after one additional unit PC yesteday; LDH remains stable 350 > 365   Co-ox 55%->53%->56%->60%>62%>59>64% (milrinone turned down to 0.125)>52%>48% (mirlinone back to 0.25) >62.9%>79%>50%>49.7>59.8>60  RHC 11/16/13:  done on milrinone 0.25 mcg/kg/min and VAD speed 8600: RA = 3  RV = 29/1/5  PA = 29/12 (17)  PCW = 5  Fick cardiac output/index = 6.3/3.0  Thermodilution CO/CI = 4.6/2.2  PVR = 2.6 WU  FA sat = 92%  PA sat = 49%, 54%   LVAD INTERROGATION:  HeartMate II LVAD:  Flow 4.2  liters/min, speed 8600, power 4.8,  PI 6.9 Alarms: none Events: 30 PI events        Objective:    Vital Signs:   Temp:  [97.9 F (36.6 C)-98.4 F (36.9 C)] 97.9 F (36.6  C) (01/14 0445) Pulse Rate:  [75] 75 (01/14 1200) SpO2:  [96 %] 96 % (01/14 0445) Weight:  [88.9 kg (195 lb 15.8 oz)] 88.9 kg (195 lb 15.8 oz) (01/14 0500) Last BM Date: 11/14/13  Intake/Output:   Intake/Output Summary (Last 24 hours) at 11/18/13 1926 Last data filed at 11/18/13 0645  Gross per 24 hour  Intake  89.13 ml  Output    600 ml  Net -510.87 ml     Physical Exam: General: Sitting in chair, feels good HEENT: normal  Neck: supple. JVP 7 Carotids 2+ bilat; no bruits. No lymphadenopathy or thryomegaly appreciated.  Cor: Faint heart sounds , LVAD hum present.  Lungs: CTA Abdomen: soft, nontender, nondistended. No hepatosplenomegaly. No bruits or masses. Active bowel sounds.  Driveline: C/D/I; securement device intact and driveline incorporated  Extremities: warm. TED hose in place; trace edema' RUE PICC 2L Neuro: alert & orientedx3, cranial nerves grossly intact. moves all 4 extremities w/o difficulty. Affect pleasant   Telemetry: a-paced, 75  Labs: Basic Metabolic Panel:  Recent Labs Lab 11/14/13 0500 11/15/13 0500 11/16/13 0529 11/17/13 0518 11/18/13 0420  NA 136* 138 136* 139 137  K 4.0 4.3 4.1 4.1 4.1  CL 100 101 100 103 102  CO2 28 28 27 27 26   GLUCOSE 90 90 92 93 87  BUN 23 23 22 21  22  CREATININE 1.31 1.42* 1.41* 1.34 1.41*  CALCIUM 8.0* 7.9* 8.0* 7.8* 8.0*    Liver Function Tests:  Recent Labs Lab 11/14/13 0500 11/15/13 0500 11/16/13 0529 11/17/13 0518 11/18/13 0420  AST 26 24 23 21 21   ALT 20 18 18 15 15   ALKPHOS 92 91 88 90 92  BILITOT 0.4 0.4 0.4 0.4 0.5  PROT 4.9* 4.9* 4.9* 4.9* 4.9*  ALBUMIN 2.1* 2.1* 2.2* 2.1* 2.2*   No results found for this basename: LIPASE, AMYLASE,  in the last 168 hours No results found for this basename: AMMONIA,  in the last 168 hours  CBC:  Recent Labs Lab 11/15/13 0500 11/15/13 1900 11/16/13 0529 11/17/13 0518 11/18/13 0420  WBC 8.1 8.2 7.1 8.2 7.3  HGB 8.8* 8.3* 8.2* 8.9* 9.4*  HCT 27.1* 25.5*  24.1* 26.7* 27.4*  MCV 90.6 92.4 90.3 91.1 89.3  PLT 267 243 241 258 239    INR:  2.80  Recent Labs Lab 11/14/13 0500 11/15/13 0500 11/16/13 0529 11/17/13 0518 11/18/13 0420  INR 2.61* 2.38* 2.53* 2.48* 2.80*   Other results:    Imaging: No results found.   Medications:     Scheduled Medications:   Infusions:   PRN Medications:      Assessment:   1) Chronic systolic HF, Class IV, EF 20-25%  --s/p HM II VAD implant 12/15  2) NICM/ICM  3) Recurrent Vtach  - ablation 09/2011 and 06/2013  4) Atrial flutter  - s/p DC-CV 10/15/2013  - Atrial fibrillation recurred post-op.  - TEE-guided DCCV 12/31 5) CAD with recanalized occluded RCA with left to right collat.  6) PAD: s/p aorto-bifem, bilateral renal artery bypass, bilateral fem-pop, and left CEA.  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  8) Pleural effusion s/p thoracentesis 1/4    Plan/Discussion:     Much improved. Ready for d/c today.   Will need home milrinone.  Decrease asa to 81 daily. Follow h/h closely. Run INR 2.0-2.5  Demadex PRN weigh gain only.  VAD interrogation looks good.    Length of Stay: [address] 7:26 PM

## 2013-11-18 NOTE — Progress Notes (Signed)
[id number] Reviewed exercise ed with pt and girl friend. Understanding voiced. Guidelines written. Reviewed sternal precautions. Encouraged IS and flutter valve. Discussed CRP 2. Pt is not sure if he wants to go to [place] as he lives other side of [place]. He will think about it and discuss with Dr. Haroldine Laws at next appt. Gave low sodium diet handouts as pt has been adhering to low sodium and could use for review. Graylon Good RN BSN 11/18/2013 11:09 AM

## 2013-11-18 NOTE — Progress Notes (Signed)
The Chaplain stopped by to visit the patient but the patient was very resistant and did not allow the Chaplain to come in the room. The patient told the Chaplain "he is fine" and "will be getting released from the hospital today." The Chaplain said "ok sir, have a great day!"  Chaplain [employer]

## 2013-11-19 ENCOUNTER — Telehealth (HOSPITAL_COMMUNITY): Payer: Self-pay | Admitting: Cardiology

## 2013-11-19 ENCOUNTER — Telehealth (HOSPITAL_COMMUNITY): Payer: Self-pay

## 2013-11-19 ENCOUNTER — Telehealth (HOSPITAL_COMMUNITY): Payer: Self-pay | Admitting: *Deleted

## 2013-11-19 NOTE — Telephone Encounter (Signed)
Debbie called with an update after pts visit Pt did have 2+ LE edema, weight is up 1 lb, cough with yellowish sputum, lungs are clear Otherwise pt is doing well - should pt restart Lasix, pt was taking this pre VAD implant and since he is having some swelling will he need to restart? -Please clarify lab order set. One order received for BMET/INR Second order received for CBC/MAGNESSIUM Next scheduled home visit 11/23/13

## 2013-11-19 NOTE — Telephone Encounter (Signed)
Called to check on pt after discharge home; [name] reports he has had 2 small nosebleeds. Dr. Haroldine Laws updated - instructed [name] to decrease coumadin dose to 1 mg this evening then return to 2 mg daily; she verbalized understanding of same.

## 2013-11-19 NOTE — Telephone Encounter (Signed)
Patient's major support person mary called regarding sudden nose bleed.  Had just started prior to call, first one since discharge from hospital.  Instructed to take rolled gauze and insert into nare and hold light pressure with held tilted back.  Suggested trying a room humidifier or saline nasal spray to keep nares from becoming dry that may be causing nose bleed.  Also reminded of effects of blood thinners r/t increased bleeding risk.  Instrcuted to call back if bleeding does not stop or worsens, and with any other questions/concerns. Renee Pain

## 2013-11-23 ENCOUNTER — Telehealth (HOSPITAL_COMMUNITY): Payer: Self-pay | Admitting: Cardiology

## 2013-11-23 NOTE — Telephone Encounter (Signed)
Short stay closed, pt coming for appt tomorrow, will address

## 2013-11-23 NOTE — Telephone Encounter (Signed)
[name] called to report pts 2nd line on PICC has completely clotted. Milrinone is still infusing properly   Please advise

## 2013-11-24 ENCOUNTER — Other Ambulatory Visit (HOSPITAL_COMMUNITY): Payer: Self-pay | Admitting: Adult Health

## 2013-11-24 ENCOUNTER — Other Ambulatory Visit (HOSPITAL_COMMUNITY): Payer: Self-pay | Admitting: *Deleted

## 2013-11-24 ENCOUNTER — Encounter: Payer: Self-pay | Admitting: Licensed Clinical Social Worker

## 2013-11-24 ENCOUNTER — Ambulatory Visit (HOSPITAL_COMMUNITY)
Admit: 2013-11-24 | Discharge: 2013-11-24 | Disposition: A | Discharge status: Home / Self Care | Payer: Medicare Other | Attending: Internal Medicine | Admitting: Internal Medicine

## 2013-11-24 ENCOUNTER — Ambulatory Visit (HOSPITAL_COMMUNITY): Payer: Self-pay | Admitting: *Deleted

## 2013-11-24 VITALS — BP 112/0 | HR 74 | Ht 72.0 in | Wt 201.8 lb

## 2013-11-24 DIAGNOSIS — I498 Other specified cardiac arrhythmias: Secondary | ICD-10-CM | POA: Insufficient documentation

## 2013-11-24 DIAGNOSIS — I252 Old myocardial infarction: Secondary | ICD-10-CM | POA: Insufficient documentation

## 2013-11-24 DIAGNOSIS — E039 Hypothyroidism, unspecified: Secondary | ICD-10-CM | POA: Insufficient documentation

## 2013-11-24 DIAGNOSIS — Z7901 Long term (current) use of anticoagulants: Secondary | ICD-10-CM

## 2013-11-24 DIAGNOSIS — N259 Disorder resulting from impaired renal tubular function, unspecified: Secondary | ICD-10-CM

## 2013-11-24 DIAGNOSIS — I509 Heart failure, unspecified: Secondary | ICD-10-CM

## 2013-11-24 DIAGNOSIS — Z7982 Long term (current) use of aspirin: Secondary | ICD-10-CM | POA: Insufficient documentation

## 2013-11-24 DIAGNOSIS — I5022 Chronic systolic (congestive) heart failure: Secondary | ICD-10-CM | POA: Insufficient documentation

## 2013-11-24 DIAGNOSIS — Z8673 Personal history of transient ischemic attack (TIA), and cerebral infarction without residual deficits: Secondary | ICD-10-CM | POA: Insufficient documentation

## 2013-11-24 DIAGNOSIS — Z95810 Presence of automatic (implantable) cardiac defibrillator: Secondary | ICD-10-CM | POA: Insufficient documentation

## 2013-11-24 DIAGNOSIS — E785 Hyperlipidemia, unspecified: Secondary | ICD-10-CM | POA: Insufficient documentation

## 2013-11-24 DIAGNOSIS — Z95811 Presence of heart assist device: Secondary | ICD-10-CM

## 2013-11-24 DIAGNOSIS — I10 Essential (primary) hypertension: Secondary | ICD-10-CM | POA: Insufficient documentation

## 2013-11-24 DIAGNOSIS — I951 Orthostatic hypotension: Secondary | ICD-10-CM | POA: Insufficient documentation

## 2013-11-24 DIAGNOSIS — I4729 Other ventricular tachycardia: Secondary | ICD-10-CM

## 2013-11-24 DIAGNOSIS — I4891 Unspecified atrial fibrillation: Secondary | ICD-10-CM

## 2013-11-24 DIAGNOSIS — I4892 Unspecified atrial flutter: Secondary | ICD-10-CM | POA: Insufficient documentation

## 2013-11-24 DIAGNOSIS — Z79899 Other long term (current) drug therapy: Secondary | ICD-10-CM | POA: Insufficient documentation

## 2013-11-24 DIAGNOSIS — I739 Peripheral vascular disease, unspecified: Secondary | ICD-10-CM | POA: Insufficient documentation

## 2013-11-24 DIAGNOSIS — I2589 Other forms of chronic ischemic heart disease: Secondary | ICD-10-CM | POA: Insufficient documentation

## 2013-11-24 DIAGNOSIS — I2510 Atherosclerotic heart disease of native coronary artery without angina pectoris: Secondary | ICD-10-CM | POA: Insufficient documentation

## 2013-11-24 DIAGNOSIS — I472 Ventricular tachycardia: Secondary | ICD-10-CM

## 2013-11-24 LAB — CBC
HEMATOCRIT: 34.3 % — AB (ref 39.0–52.0)
Hemoglobin: 11 g/dL — ABNORMAL LOW (ref 13.0–17.0)
MCH: 30.1 pg (ref 26.0–34.0)
MCHC: 32.1 g/dL (ref 30.0–36.0)
MCV: 93.7 fL (ref 78.0–100.0)
Platelets: 262 10*3/uL (ref 150–400)
RBC: 3.66 MIL/uL — ABNORMAL LOW (ref 4.22–5.81)
RDW: 20.1 % — AB (ref 11.5–15.5)
WBC: 7.4 10*3/uL (ref 4.0–10.5)

## 2013-11-24 LAB — BASIC METABOLIC PANEL
BUN: 15 mg/dL (ref 6–23)
CALCIUM: 8.3 mg/dL — AB (ref 8.4–10.5)
CHLORIDE: 102 meq/L (ref 96–112)
CO2: 23 mEq/L (ref 19–32)
CREATININE: 1.27 mg/dL (ref 0.50–1.35)
GFR calc non Af Amer: 54 mL/min — ABNORMAL LOW (ref >90)
GFR, EST AFRICAN AMERICAN: 63 mL/min — AB (ref >90)
Glucose, Bld: 112 mg/dL — ABNORMAL HIGH (ref 70–99)
Potassium: 4.4 mEq/L (ref 3.7–5.3)
Sodium: 138 mEq/L (ref 137–147)

## 2013-11-24 LAB — PROTIME-INR
INR: 1.83 — AB (ref 0.00–1.49)
Prothrombin Time: 20.6 seconds — ABNORMAL HIGH (ref 11.6–15.2)

## 2013-11-24 LAB — LACTATE DEHYDROGENASE: LDH: 486 U/L — ABNORMAL HIGH (ref 94–250)

## 2013-11-24 LAB — PRO B NATRIURETIC PEPTIDE: Pro B Natriuretic peptide (BNP): 7746 pg/mL — ABNORMAL HIGH (ref 0–125)

## 2013-11-24 MED ORDER — FUROSEMIDE 20 MG PO TABS: ORAL_TABLET | ORAL | Status: DC

## 2013-11-24 MED ORDER — WARFARIN SODIUM 2 MG PO TABS: ORAL_TABLET | ORAL | Status: DC

## 2013-11-24 MED ORDER — MIDODRINE HCL 5 MG PO TABS: 2.5000 mg | ORAL_TABLET | Freq: Three times a day (TID) | ORAL | Status: DC

## 2013-11-24 MED ORDER — ALTEPLASE 2 MG IJ SOLR: 2.0000 mg | Freq: Once | INTRAMUSCULAR | Status: DC

## 2013-11-24 NOTE — Progress Notes (Signed)
CSW met with patient and [name] (SO) in the HF clinic. Patient reports that he is managing well at home. He states that he is sleeping well and has adjusted to all the new equipment. He did report that his appetite is low and felt that was normal but will continue to try to increase his food intake with frequent small meals. He stated that he is hopeful to have the milrinone bag discontinued as that seems to be the challenge especially at night with runs to the bathroom. [name] appeared to be coping with with her caregiving responsibilities and denies any concerns at this time. CSW will continue to follow for support and any other needs that may arise. [name], [place]

## 2013-11-24 NOTE — Patient Instructions (Addendum)
1. Increase coumadin dose to 2 mg alternating with 1 mg (take 2 mg today) 2. Decrease midodrine to 2.5 (1/2 tablet) three times daily 3.  Take Lasix 20 mg on Mon and Friday (take first dose today); please hold if weight less than 190 lbs 4.  Return to [place] clinic in one week

## 2013-11-24 NOTE — Telephone Encounter (Signed)
IV team RN evaluated at [facility] today and PICC flushed and was working fine

## 2013-11-24 NOTE — Progress Notes (Signed)
Symptom  Yes  No  Details   Angina        x Activity:   Claudication        x How far:   Syncope        x When:     Stroke        x        TIA  2001  Orthopnea         x How many pillows: 1  PND         x How often:  CPAP       N/A How many hrs:   Pedal edema        x         R > L  Abd fullness        x         N&V        x        poor appetite; 3 episodes of nausea  Diaphoresis         x When:   Bleeding       x   2 light nosebleeds Friday  Urine color        yellow  SOB        x   Activity:  Walking 300 - 400 feet level ground  Palpitations        x When:  ICD shock        x   Hospitlizaitons       x  When/where/why:  12/14 - 11/18/13 VAD placement  ED visit        x When/where/why:  Other MD        x When/who/why:  Activity     Increasing; planned PT 3 x week  Fluid    No limitations  < 2000   Diet    Low salt food   Vital signs: HR: 74 MAP BP: 112 O2 Sat:  98 Wt:  201.8 Last wt:  192 (hospital discharge wt) Ht: 6'  LVAD interrogation reveals:  Speed:   8600 Flow:  4.4 Power:   4.6 PI:  7.3 Alarms:  none Events:  PI events (3 - 6) Fixed speed:  8600 Low speed limit:  8000  LVAD exit site: .   Dressing change per VAD coordinator using sterile technique. Dressing removed, site  cleansed with Chlora prep applicators x 2, dried, and guaze dressing with aquacel strip. The site has near complete tissue ingrowth, remains erythematous under aquacel strip; no tenderness, drainage, or foul odor;  anchor device in place.  Pt denies fever or chills. Driveline dressing is being changed daily per sterile technique.  Pt/caregiver deny any alarms or VAD equipment issues. VAD coordinator reviewed daily log from home for daily temperature, weight, and VAD parameters. Pt is performing daily controller and system monitor self tests along with completing weekly and monthly maintenance for LVAD equipment.   LVAD equipment check completed and is in good working order. Back-up equipment  present. LVAD education done on emergency procedures and precautions and reviewed exit site care.   Pt/wife reported that one PICC line port was clotted; states [facility] nurse came over weekend and told them there was "no one trained" with [employer] to de-clot the port.  Carolynn Sayers contacted [facility] and Gregery Na, RN at bedside to change cap and flush port - line open.  VAD coordinator contacted Christa See, Care Transition Liason with [facility] to report issues  with clotted port, PICC line dressing and flushing protocol, and HH nurse checking BP with regular cuff. Stanton Kidney will contact [facility] nurse for f/u.

## 2013-11-25 ENCOUNTER — Encounter (HOSPITAL_COMMUNITY): Payer: Medicare Other

## 2013-11-29 NOTE — Progress Notes (Signed)
ADVANCED HF CLINIC NOTE   Subjective:  [name] is a 73 yo male with VT, PAD and severe systolic HF (EF 16-10%) due to mixed ischemic/nonischemic CM he is s/p HM IILVAD implant for destination therapy on October 19, 2013. His course was complicated by VT, syncope, A fib/Aflutter and persistent low output felt to be due to RHF. On November 16, 2013 he had a RHC due to increased PI events and fatigue. RHC showed low cardiac output and no evidence of RV failure on Milrinone. He was felt to be volume depleted. He was discharged home on 11/18/13 on mirlinone and PRN lasix 20 mg for a weight of 200 pounds or greater.   Follow up:  He resents for post-hospital f/u. Pt reports he is feeling better since discharge; denies any orthostatic syncope, dizziness, or lightheadedness.  States his activity level has increased since discharge home. Does report SOB after 300 - 400 feet level ground; right leg swollen > left leg; right arm swollen > left.  He has not taken any PRN Lasix since discharge; his home weight has remained steady at 195 lbs. Denies orthopnea, PND, bleeding or syncope.  MAP eleveated at 112; repeated 120  Hgb 11.0 (discharge Hgb 9.4)  Denies LVAD alarms.  Denies driveline trauma, erythema or drainage.  Denies ICD shocks.   Reports taking Coumadin as prescribed and adherence to anticoagulation based dietary restrictions.  Denies bright red blood per rectum or melena, no dark urine or hematuria.     Past Medical History  Diagnosis Date  . CAD (coronary artery disease)     s/p MI 1982;  s/p DES to CFX 2011;  s/p NSTEMI 4/12 in setting of VTach;  cath 7/12:  LM ok, LAD mid 30-40%, Dx's with 40%, mCFX stent patent, prox to mid RCA occluded with R to R and L to R collats.  Medical Tx was continued  . Bradycardia   . Ventricular tachycardia     s/p AICD;   h/o ICD shock;  Amiodarone Rx. S/P ablation in 2012  . PVD (peripheral vascular disease)     h/o claudication;  s/p R fem to pop BPG  3/11;  s/p L CFA BPG 7/03;  s/p repair of inf AAA 6/03;  s/p aorta to bilat renal BPG 6/03  . HTN (hypertension)   . HLD (hyperlipidemia)   . Cytopenia   . Hypothyroidism   . Renal insufficiency   . Claudication   . Chronic systolic heart failure   . Ischemic cardiomyopathy     echo 7/12:  EF 25-30%, mild AI, mod MR, mod LAE  . TIA (transient ischemic attack) 2001  . CVD (cerebrovascular disease)     left CEA 2008  . Stroke   . Anemia   . Inappropriate therapy from implantable cardioverter-defibrillator 04/02/2013    ATP for sinus tach  SVT wavelet identified SVT but was no  passive   . Myocardial infarction 1992  . Anginal pain   . Automatic implantable cardioverter-defibrillator in situ     Medtronic Evera device serial number [id number]    . Sleep apnea     denies wearing mask (05/25/2013)  . Arthritis     "a little in my knees" (05/25/2013)  . Melanoma of ear 2013    "near left ear" (05/25/2013)    Current Outpatient Prescriptions  Medication Sig Dispense Refill  . amiodarone (PACERONE) 200 MG tablet Take 1 tablet (200 mg total) by mouth 2 (two) times daily.  60 tablet  6  . aspirin 81 MG tablet Take 81 mg by mouth daily.       [name] atorvastatin (LIPITOR) 80 MG tablet Take 80 mg by mouth daily.       [name] CALCIUM-MAGNESIUM-ZINC PO Take 3 tablets by mouth at bedtime. 1000 mg calcium, 400 mg magnesium, 25 mg zinc      . digoxin 62.5 MCG TABS Take 0.0625 mg by mouth daily.  30 each  6  . docusate sodium 100 MG CAPS Take 200 mg by mouth daily as needed for mild constipation.  30 capsule  3  . ferrous gluconate (FERGON) 324 MG tablet Take 1 tablet (324 mg total) by mouth 3 (three) times daily with meals.  90 tablet  3  . levothyroxine (SYNTHROID) 112 MCG tablet Take 1 tablet (112 mcg total) by mouth daily.      . midodrine (PROAMATINE) 5 MG tablet Take 0.5 tablets (2.5 mg total) by mouth 3 (three) times daily with meals.  90 tablet  6  . milrinone (PRIMACOR) 20 MG/100ML SOLN infusion  Inject 23.15 mcg/min into the vein continuous.  100 mL  6  . Multiple Vitamins-Minerals (EYE VITAMINS PO) Take 1 capsule by mouth 2 (two) times daily.       . pantoprazole (PROTONIX) 40 MG tablet Take 1 tablet (40 mg total) by mouth daily.  30 tablet  6  . ranolazine (RANEXA) 500 MG 12 hr tablet Take 1 tablet (500 mg total) by mouth 2 (two) times daily.  60 tablet  6  . warfarin (COUMADIN) 2 MG tablet As directed for INR goal 2 - 2.5  45 tablet  6  . alteplase (CATHFLO ACTIVASE) 2 MG injection 2 mg by Intracatheter route once.  1 each  0  . furosemide (LASIX) 20 MG tablet Take 20 mg Monday and Friday (hold if weight < 190 lbs)  30 tablet  6  . Multiple Vitamin (MULTIVITAMIN) tablet Take 1 tablet by mouth daily.       . nitroGLYCERIN (NITROSTAT) 0.4 MG SL tablet Place 0.4 mg under the tongue every 5 (five) minutes as needed for chest pain. For chest pain      . traMADol (ULTRAM) 50 MG tablet Take 1 tablet (50 mg total) by mouth every 6 (six) hours as needed for moderate pain.  15 tablet  0   No current facility-administered medications for this encounter.    Demerol; Meperidine hcl; and Opium  REVIEW OF SYSTEMS: All systems negative except as listed in HPI, PMH and Problem list.  LVAD INTERROGATION:   HeartMate II LVAD:   Flow 4.4 liters/min Speed 8600 Power 4.6 PI 7.3 Alarms: none Events: PI (3 - 6) Fixed speed: 8600 Low speed limit:  8000   I reviewed the LVAD parameters from today, and compared the results to the patient's prior recorded data.  No programming changes were made.  The LVAD is functioning within specified parameters.  The patient performs LVAD self-test daily.  LVAD interrogation was negative for any significant power changes, alarms or PI events/speed drops.  LVAD equipment check completed and is in good working order.  Back-up equipment present.   LVAD education done on emergency procedures and precautions and reviewed exit site care.    Filed Vitals:   11/24/13  1028  BP: 112/0  Pulse: 74  Height: 6' (1.829 m)  Weight: 201 lb 12.8 oz (91.536 kg)  SpO2: 98%    Physical Exam: GENERAL: Well appearing, male who presents to clinic today in  no acute distress. HEENT: normal  NECK: Supple, JVP  .  2+ bilaterally, no bruits.  No lymphadenopathy or thyromegaly appreciated.   CARDIAC:  Mechanical heart sounds with LVAD hum present.  LUNGS:  Clear to auscultation bilaterally.  ABDOMEN:  Soft, round, nontender, positive bowel sounds x4.     LVAD exit site: well-healed and incorporated.  Dressing dry and intact.  No erythema or drainage.  Stabilization device present and accurately applied.  Driveline dressing is being changed daily per sterile technique. EXTREMITIES:  Warm and dry, no cyanosis, clubbing, rash or edema  NEUROLOGIC:  Alert and oriented x 4.  Gait steady.  No aphasia.  No dysarthria.  Affect pleasant.     ASSESSMENT AND PLAN:   1. Chronic systolic HF status post LVAD implantation - He is doing very well with NYHA II symptoms. Volume status mildly elevated.. Will continue milrinone for now but hope to wean over next few weeks as tolerated. Will try to wean off midodrine first. Will start lasix 20 mg twice weekly; plan to hold if weight < 190 lbs.  2.  Anticoagulation management - INR goal 2.0-2.5.  INR subtherapeutic today; reviewed with PharmD; will increase coumadin to 1 mg alternating with 2 mg; will re-check INR in one week at next clinic visit. Reviewed MVI content of 30 mcg of Vit K; pt will start taking daily and coumadin dose will be adjusted if necessary.  3.  HTN - This has been complicated by periods of orthostatic hypotension. MAP 112 - 120; will decrease Midodrine to 2.5 mg tid and possible stop next clinic visit.  4. AF - maintaining SR on amio and Ranexa per Dr. Caryl Comes.   Jacquise Rarick,MD 3:04 PM

## 2013-12-01 ENCOUNTER — Other Ambulatory Visit (HOSPITAL_COMMUNITY): Payer: Self-pay | Admitting: *Deleted

## 2013-12-01 DIAGNOSIS — Z95811 Presence of heart assist device: Secondary | ICD-10-CM

## 2013-12-01 DIAGNOSIS — Z7901 Long term (current) use of anticoagulants: Secondary | ICD-10-CM

## 2013-12-02 ENCOUNTER — Ambulatory Visit (HOSPITAL_COMMUNITY): Payer: Self-pay | Admitting: *Deleted

## 2013-12-02 ENCOUNTER — Ambulatory Visit (HOSPITAL_COMMUNITY)
Admission: RE | Admit: 2013-12-02 | Discharge: 2013-12-02 | Disposition: A | Discharge status: Home / Self Care | Payer: Medicare Other | Source: Ambulatory Visit | Attending: Internal Medicine | Admitting: Internal Medicine

## 2013-12-02 VITALS — BP 110/0 | HR 75 | Ht 72.0 in | Wt 197.8 lb

## 2013-12-02 DIAGNOSIS — Z7901 Long term (current) use of anticoagulants: Secondary | ICD-10-CM

## 2013-12-02 DIAGNOSIS — I10 Essential (primary) hypertension: Secondary | ICD-10-CM

## 2013-12-02 DIAGNOSIS — I5022 Chronic systolic (congestive) heart failure: Secondary | ICD-10-CM

## 2013-12-02 DIAGNOSIS — Z95811 Presence of heart assist device: Secondary | ICD-10-CM

## 2013-12-02 DIAGNOSIS — I4891 Unspecified atrial fibrillation: Secondary | ICD-10-CM

## 2013-12-02 LAB — CBC
HEMATOCRIT: 33.4 % — AB (ref 39.0–52.0)
HEMOGLOBIN: 11 g/dL — AB (ref 13.0–17.0)
MCH: 30.9 pg (ref 26.0–34.0)
MCHC: 32.9 g/dL (ref 30.0–36.0)
MCV: 93.8 fL (ref 78.0–100.0)
Platelets: 186 10*3/uL (ref 150–400)
RBC: 3.56 MIL/uL — ABNORMAL LOW (ref 4.22–5.81)
RDW: 20 % — ABNORMAL HIGH (ref 11.5–15.5)
WBC: 6.6 10*3/uL (ref 4.0–10.5)

## 2013-12-02 LAB — BASIC METABOLIC PANEL
BUN: 17 mg/dL (ref 6–23)
CHLORIDE: 101 meq/L (ref 96–112)
CO2: 25 mEq/L (ref 19–32)
Calcium: 8.5 mg/dL (ref 8.4–10.5)
Creatinine, Ser: 1.22 mg/dL (ref 0.50–1.35)
GFR calc Af Amer: 66 mL/min — ABNORMAL LOW (ref >90)
GFR, EST NON AFRICAN AMERICAN: 57 mL/min — AB (ref >90)
GLUCOSE: 88 mg/dL (ref 70–99)
Potassium: 4.5 mEq/L (ref 3.7–5.3)
Sodium: 137 mEq/L (ref 137–147)

## 2013-12-02 LAB — LACTATE DEHYDROGENASE: LDH: 395 U/L — ABNORMAL HIGH (ref 94–250)

## 2013-12-02 LAB — PROTIME-INR
INR: 1.7 — AB (ref 0.00–1.49)
Prothrombin Time: 19.5 seconds — ABNORMAL HIGH (ref 11.6–15.2)

## 2013-12-02 LAB — PRO B NATRIURETIC PEPTIDE: PRO B NATRI PEPTIDE: 4490 pg/mL — AB (ref 0–125)

## 2013-12-02 NOTE — Progress Notes (Signed)
Symptom  Yes  No  Details   Angina        x Activity:   Claudication        x How far:   Syncope        x When:     Stroke        x        TIA  2001  Orthopnea         x How many pillows: 1  PND         x How often:  CPAP       N/A How many hrs:   Pedal edema        x         R > L  Abd fullness        x         N&V        x        poor appetite; 1 episode of nausea  Diaphoresis         x When:   Bleeding       x   1 small nosebleed  Urine color        yellow  SOB        x   Activity:  Walking 400 - 500 feet level ground  Palpitations        x When:  ICD shock        x   Hospitlizaitons             x When/where/why:   ED visit        x When/where/why:  Other MD        x When/who/why:  Activity     Increasing; planned PT 3 x week  Fluid    No limitations  < 2000   Diet    Low salt food   Vital signs: HR: 75 MAP BP:  110 O2 Sat:  100 Wt:  197.8 Last wt: 201.8 Ht: 6'  LVAD interrogation reveals:  Speed:   8600 Flow:  4.7 Power:   4.7 PI:  7.6 Alarms:  none Events:  4 PI events  Fixed speed:  8600 Low speed limit:  8000  LVAD exit site: [name]  VAD dressing removed and site care performed using sterile technique. Drive line exit site cleaned with Chlora prep applicators x 2, allowed to dry, and gauze dressing with aquacel strip re-applied. Exit site healing, the velour is fully implanted at exit site. Remains erythematous under aquacel strip; no tenderness, drainage, or foul odor noted. Drive line anchor re-applied. Pt denies fever or chills. Driveline dressing is being changed daily per sterile technique. Will advance dressing changes to every other day.  Pt/caregiver deny any alarms or VAD equipment issues. VAD coordinator reviewed daily log from home for daily temperature, weight, and VAD parameters. Pt is performing daily controller and system monitor self tests along with completing weekly and monthly maintenance for LVAD equipment.   LVAD equipment check completed and is  in good working order. Back-up equipment present. LVAD education done on emergency procedures and precautions and reviewed exit site care.

## 2013-12-02 NOTE — Patient Instructions (Signed)
1.  Stop Midodrine 2.  Change coumadin to 2 mg daily except 1 mg on Monday and Friday  3.  Advance dressing changes to every other day 4.  Return to [place] clinic in two weeks on Thursdays

## 2013-12-05 NOTE — Progress Notes (Signed)
ADVANCED HF CLINIC NOTE  Subjective:  [name] is a 73 yo male with h/o VT, PAD and severe systolic HF (EF 0000000) due to mixed ischemic/nonischemic CM he is s/p HM IILVAD implant for destination therapy on October 19, 2013. His course was complicated by VT, syncope, A fib/Aflutter and persistent low output felt to be due to RHF. On November 16, 2013 he had a RHC due to increased PI events and fatigue. RHC showed low cardiac output but no evidence of RV failure on Milrinone. He was felt to be volume depleted. He was discharged home on 11/18/13 on mirlinone and PRN lasix 20 mg for a weight of 200 pounds or greater.   Follow up:  He presents for post-hospital f/u. Says he is feeling much better since discharge; denies any orthostatic syncope, dizziness, or lightheadedness.  States his activity level has increased since discharge home. Does report SOB after 400 - 500 feet level ground; right leg swollen > left leg which is chronic for him.  He has taken any Lasix 20 mg two times week since last clinic visit with weight loss of four pounds. Denies orthopnea, PND, bleeding or syncope.No problems with equipment or driveline.   MAP eleveated at 110  Hgb 11.0   Denies LVAD alarms.  Denies driveline trauma, erythema or drainage.  Denies ICD shocks.    Reports taking Coumadin as prescribed and adherence to anticoagulation based dietary restrictions.  Denies bright red blood per rectum or melena, no dark urine or hematuria.     Past Medical History  Diagnosis Date  . CAD (coronary artery disease)     s/p MI 1982;  s/p DES to CFX 2011;  s/p NSTEMI 4/12 in setting of VTach;  cath 7/12:  LM ok, LAD mid 30-40%, Dx's with 40%, mCFX stent patent, prox to mid RCA occluded with R to R and L to R collats.  Medical Tx was continued  . Bradycardia   . Ventricular tachycardia     s/p AICD;   h/o ICD shock;  Amiodarone Rx. S/P ablation in 2012  . PVD (peripheral vascular disease)     h/o claudication;  s/p R  fem to pop BPG 3/11;  s/p L CFA BPG 7/03;  s/p repair of inf AAA 6/03;  s/p aorta to bilat renal BPG 6/03  . HTN (hypertension)   . HLD (hyperlipidemia)   . Cytopenia   . Hypothyroidism   . Renal insufficiency   . Claudication   . Chronic systolic heart failure   . Ischemic cardiomyopathy     echo 7/12:  EF 25-30%, mild AI, mod MR, mod LAE  . TIA (transient ischemic attack) 2001  . CVD (cerebrovascular disease)     left CEA 2008  . Stroke   . Anemia   . Inappropriate therapy from implantable cardioverter-defibrillator 04/02/2013    ATP for sinus tach  SVT wavelet identified SVT but was no  passive   . Myocardial infarction 1992  . Anginal pain   . Automatic implantable cardioverter-defibrillator in situ     Medtronic Evera device serial number [id number]    . Sleep apnea     denies wearing mask (05/25/2013)  . Arthritis     "a little in my knees" (05/25/2013)  . Melanoma of ear 2013    "near left ear" (05/25/2013)    Current Outpatient Prescriptions  Medication Sig Dispense Refill  . amiodarone (PACERONE) 200 MG tablet Take 1 tablet (200 mg total) by mouth 2 (two) times  daily.  60 tablet  6  . aspirin 81 MG tablet Take 81 mg by mouth daily.       [name] atorvastatin (LIPITOR) 80 MG tablet Take 80 mg by mouth daily.       [name] CALCIUM-MAGNESIUM-ZINC PO Take 3 tablets by mouth at bedtime. 1000 mg calcium, 400 mg magnesium, 25 mg zinc      . digoxin 62.5 MCG TABS Take 0.0625 mg by mouth daily.  30 each  6  . docusate sodium 100 MG CAPS Take 200 mg by mouth daily as needed for mild constipation.  30 capsule  3  . ferrous gluconate (FERGON) 324 MG tablet Take 1 tablet (324 mg total) by mouth 3 (three) times daily with meals.  90 tablet  3  . furosemide (LASIX) 20 MG tablet Take 20 mg Monday and Friday (hold if weight < 190 lbs)  30 tablet  6  . levothyroxine (SYNTHROID) 112 MCG tablet Take 1 tablet (112 mcg total) by mouth daily.      . midodrine (PROAMATINE) 5 MG tablet Take 0.5 tablets (2.5  mg total) by mouth 3 (three) times daily with meals.  90 tablet  6  . milrinone (PRIMACOR) 20 MG/100ML SOLN infusion Inject 23.15 mcg/min into the vein continuous.  100 mL  6  . Multiple Vitamin (MULTIVITAMIN) tablet Take 1 tablet by mouth daily.       . Multiple Vitamins-Minerals (EYE VITAMINS PO) Take 1 capsule by mouth 2 (two) times daily.       . pantoprazole (PROTONIX) 40 MG tablet Take 1 tablet (40 mg total) by mouth daily.  30 tablet  6  . ranolazine (RANEXA) 500 MG 12 hr tablet Take 1 tablet (500 mg total) by mouth 2 (two) times daily.  60 tablet  6  . warfarin (COUMADIN) 2 MG tablet As directed for INR goal 2 - 2.5  45 tablet  6  . alteplase (CATHFLO ACTIVASE) 2 MG injection 2 mg by Intracatheter route once.  1 each  0  . nitroGLYCERIN (NITROSTAT) 0.4 MG SL tablet Place 0.4 mg under the tongue every 5 (five) minutes as needed for chest pain. For chest pain      . traMADol (ULTRAM) 50 MG tablet Take 1 tablet (50 mg total) by mouth every 6 (six) hours as needed for moderate pain.  15 tablet  0   No current facility-administered medications for this encounter.    Demerol; Meperidine hcl; and Opium  REVIEW OF SYSTEMS: All systems negative except as listed in HPI, PMH and Problem list.  LVAD INTERROGATION:   HeartMate II LVAD:   Flow 4.7 liters/min Speed 8600 Power 4.7 PI 7.6 Alarms: none Events: 4 PI events Fixed speed: 8600 Low speed limit:  8000   I reviewed the LVAD parameters from today, and compared the results to the patient's prior recorded data.  No programming changes were made.  The LVAD is functioning within specified parameters.  The patient performs LVAD self-test daily.  LVAD interrogation was negative for any significant power changes, alarms or PI events/speed drops.  LVAD equipment check completed and is in good working order.  Back-up equipment present.   LVAD education done on emergency procedures and precautions and reviewed exit site care.    Filed Vitals:    12/02/13 1123  BP: 110/0  Pulse: 75  Height: 6' (1.829 m)  Weight: 197 lb 12.8 oz (89.721 kg)  SpO2: 100%  Last weight:  201.8   Physical Exam: GENERAL: Well  appearing, male who presents to clinic today in no acute distress. HEENT: normal  NECK: Supple, JVP  .  2+ bilaterally, no bruits.  No lymphadenopathy or thyromegaly appreciated.   CARDIAC:  Mechanical heart sounds with LVAD hum present.  LUNGS:  Clear to auscultation bilaterally.  ABDOMEN:  Soft, round, nontender, positive bowel sounds x4.     LVAD exit site: well-healed and incorporated.  Dressing dry and intact.  No erythema or drainage.  Stabilization device present and accurately applied.  Driveline dressing is being changed daily per sterile technique. EXTREMITIES:  Warm and dry, no cyanosis, clubbing, rash 2+ edema on RLE 1+ on LLE NEUROLOGIC:  Alert and oriented x 4.  Gait steady.  No aphasia.  No dysarthria.  Affect pleasant.     ASSESSMENT AND PLAN:   1. Chronic systolic HF status post LVAD implantation - He is doing very well with NYHA II symptoms. Volume status mildly elevated.. Will continue milrinone for now but hope to wean over next few weeks as tolerated. Will stop midodrine today. Will continue lasix 20 mg twice weekly; plan to hold if weight < 190 lbs.  2.  Anticoagulation management - INR goal 2.0-2.5.  INR subtherapeutic today; reviewed with PharmD; will increase coumadin to 2 mg daily except 1 mg on Monday and Friday; will ask home health to re-check INR next week.  3.  HTN - This has been complicated by periods of orthostatic hypotension. MAP 110 today, will stop Midodrine.  4. AF - maintaining SR on amio and Ranexa per Dr. Caryl Comes.   VAD parameters reviewed personally and are stable.   Glori Bickers MD 9:53 PM

## 2013-12-08 ENCOUNTER — Telehealth (HOSPITAL_COMMUNITY): Payer: Self-pay | Admitting: Cardiology

## 2013-12-08 ENCOUNTER — Ambulatory Visit: Payer: Self-pay | Admitting: *Deleted

## 2013-12-08 LAB — PROTIME-INR: INR: 1.6 — AB (ref <=1.1)

## 2013-12-08 NOTE — Telephone Encounter (Signed)
Call report for INR 1.6

## 2013-12-11 ENCOUNTER — Ambulatory Visit (HOSPITAL_COMMUNITY): Payer: Self-pay | Admitting: *Deleted

## 2013-12-11 LAB — PROTIME-INR: INR: 1.8 — AB (ref <=1.1)

## 2013-12-14 ENCOUNTER — Ambulatory Visit (HOSPITAL_COMMUNITY): Payer: Self-pay | Admitting: *Deleted

## 2013-12-14 LAB — PROTIME-INR: INR: 2 — AB (ref 0.9–1.1)

## 2013-12-17 ENCOUNTER — Ambulatory Visit (HOSPITAL_COMMUNITY)
Admission: RE | Admit: 2013-12-17 | Discharge: 2013-12-17 | Disposition: A | Discharge status: Home / Self Care | Payer: Medicare Other | Source: Ambulatory Visit | Attending: Internal Medicine | Admitting: Internal Medicine

## 2013-12-17 ENCOUNTER — Other Ambulatory Visit (HOSPITAL_COMMUNITY): Payer: Self-pay | Admitting: *Deleted

## 2013-12-17 VITALS — BP 104/0 | HR 75 | Ht 72.0 in | Wt 195.8 lb

## 2013-12-17 DIAGNOSIS — I5023 Acute on chronic systolic (congestive) heart failure: Secondary | ICD-10-CM

## 2013-12-17 DIAGNOSIS — I951 Orthostatic hypotension: Secondary | ICD-10-CM | POA: Insufficient documentation

## 2013-12-17 DIAGNOSIS — I471 Supraventricular tachycardia: Secondary | ICD-10-CM

## 2013-12-17 DIAGNOSIS — I4891 Unspecified atrial fibrillation: Secondary | ICD-10-CM | POA: Insufficient documentation

## 2013-12-17 DIAGNOSIS — I2510 Atherosclerotic heart disease of native coronary artery without angina pectoris: Secondary | ICD-10-CM

## 2013-12-17 DIAGNOSIS — Z7901 Long term (current) use of anticoagulants: Secondary | ICD-10-CM

## 2013-12-17 DIAGNOSIS — Z95811 Presence of heart assist device: Secondary | ICD-10-CM

## 2013-12-17 DIAGNOSIS — Z95810 Presence of automatic (implantable) cardiac defibrillator: Secondary | ICD-10-CM | POA: Insufficient documentation

## 2013-12-17 DIAGNOSIS — I5022 Chronic systolic (congestive) heart failure: Secondary | ICD-10-CM

## 2013-12-17 DIAGNOSIS — I255 Ischemic cardiomyopathy: Secondary | ICD-10-CM

## 2013-12-17 DIAGNOSIS — I10 Essential (primary) hypertension: Secondary | ICD-10-CM | POA: Insufficient documentation

## 2013-12-17 DIAGNOSIS — I252 Old myocardial infarction: Secondary | ICD-10-CM | POA: Insufficient documentation

## 2013-12-17 DIAGNOSIS — I509 Heart failure, unspecified: Secondary | ICD-10-CM | POA: Insufficient documentation

## 2013-12-17 DIAGNOSIS — I50810 Right heart failure, unspecified: Secondary | ICD-10-CM

## 2013-12-17 LAB — PROTIME-INR
INR: 2.29 — ABNORMAL HIGH (ref 0.00–1.49)
Prothrombin Time: 24.5 seconds — ABNORMAL HIGH (ref 11.6–15.2)

## 2013-12-17 LAB — BASIC METABOLIC PANEL
BUN: 15 mg/dL (ref 6–23)
CHLORIDE: 101 meq/L (ref 96–112)
CO2: 25 mEq/L (ref 19–32)
Calcium: 9.2 mg/dL (ref 8.4–10.5)
Creatinine, Ser: 1.22 mg/dL (ref 0.50–1.35)
GFR calc Af Amer: 66 mL/min — ABNORMAL LOW (ref >90)
GFR calc non Af Amer: 57 mL/min — ABNORMAL LOW (ref >90)
GLUCOSE: 100 mg/dL — AB (ref 70–99)
Potassium: 4.6 mEq/L (ref 3.7–5.3)
Sodium: 138 mEq/L (ref 137–147)

## 2013-12-17 LAB — CBC
HEMATOCRIT: 33.5 % — AB (ref 39.0–52.0)
HEMOGLOBIN: 10.6 g/dL — AB (ref 13.0–17.0)
MCH: 30.5 pg (ref 26.0–34.0)
MCHC: 31.6 g/dL (ref 30.0–36.0)
MCV: 96.3 fL (ref 78.0–100.0)
Platelets: 224 10*3/uL (ref 150–400)
RBC: 3.48 MIL/uL — ABNORMAL LOW (ref 4.22–5.81)
RDW: 19.3 % — ABNORMAL HIGH (ref 11.5–15.5)
WBC: 6.4 10*3/uL (ref 4.0–10.5)

## 2013-12-17 LAB — LACTATE DEHYDROGENASE: LDH: 557 U/L — ABNORMAL HIGH (ref 94–250)

## 2013-12-17 LAB — PRO B NATRIURETIC PEPTIDE: PRO B NATRI PEPTIDE: 5490 pg/mL — AB (ref 0–125)

## 2013-12-17 MED ORDER — DSS 100 MG PO CAPS: 200.0000 mg | ORAL_CAPSULE | Freq: Every day | ORAL | Status: DC | PRN

## 2013-12-17 MED ORDER — FERROUS GLUCONATE 324 (38 FE) MG PO TABS: 324.0000 mg | ORAL_TABLET | Freq: Three times a day (TID) | ORAL | Status: DC

## 2013-12-17 MED ORDER — PANTOPRAZOLE SODIUM 40 MG PO TBEC: 40.0000 mg | DELAYED_RELEASE_TABLET | Freq: Every day | ORAL | Status: DC

## 2013-12-17 MED ORDER — DIGOXIN 62.5 MCG PO TABS: 0.0625 mg | ORAL_TABLET | Freq: Every day | ORAL | Status: DC

## 2013-12-17 NOTE — Progress Notes (Signed)
Symptom  Yes  No  Details   Angina        x Activity:   Claudication        x How far:   Syncope        x When:     Stroke        x        TIA  2001  Orthopnea         x How many pillows: 1  PND         x How often:  CPAP       N/A How many hrs:   Pedal edema               x    Abd fullness               x    N&V        x        fair appetite; chronic nausea  Diaphoresis         x When:   Bleeding              x   Urine color        yellow  SOB       x      Activity:  Incline  Palpitations        x When:  ICD shock        x   Hospitlizaitons             x When/where/why:   ED visit        x When/where/why:  Other MD        x When/who/why:  Activity     Increasing; planned PT 2 x week; added recumbent bike  Fluid    No limitations  < 2000   Diet    Low salt food   Vital signs: HR: 75 MAP BP:  104 O2 Sat:  99 Wt:  195.8 Last wt: 197.8 Ht: 6'  LVAD interrogation reveals:  Speed:   8600 Flow:  5.0 Power:   5.0 PI:  7.0 Alarms:  none Events:  Rare PI  Fixed speed:  8600 Low speed limit:  8000  LVAD exit site: [name]  VAD dressing removed and site care performed using sterile technique. Drive line exit site cleaned with Chlora prep applicators x 2, allowed to dry, and gauze dressing with aquacel strip re-applied. Exit site has near complete tissue ingrowht, the velour is fully implanted at exit site. Less erythematous under aquacel strip; no tenderness, drainage, or foul odor noted. Drive line anchor in place. Pt denies fever or chills. Driveline dressing is being changed every other day per sterile technique. Will advance dressing changes to every three days.  Pt/caregiver deny any alarms or VAD equipment issues. VAD coordinator reviewed daily log from home for daily temperature, weight, and VAD parameters. Pt is performing daily controller and system monitor self tests along with completing weekly and monthly maintenance for LVAD equipment.   LVAD equipment check completed and  is in good working order. Back-up equipment present. LVAD education done on emergency procedures and precautions and reviewed exit site care.

## 2013-12-17 NOTE — Patient Instructions (Signed)
1.  No change in coumadin dose 2.  Return to [place] clinic one week with ramp echo

## 2013-12-18 ENCOUNTER — Other Ambulatory Visit (HOSPITAL_COMMUNITY): Payer: Self-pay | Admitting: Cardiology

## 2013-12-18 DIAGNOSIS — I5022 Chronic systolic (congestive) heart failure: Secondary | ICD-10-CM

## 2013-12-21 ENCOUNTER — Ambulatory Visit (HOSPITAL_COMMUNITY): Payer: Self-pay | Admitting: *Deleted

## 2013-12-21 LAB — PROTIME-INR: INR: 2.5 — AB (ref <=1.1)

## 2013-12-21 NOTE — Progress Notes (Signed)
ADVANCED HF CLINIC NOTE  Subjective:  [name] is a 73 yo male with h/o VT, PAD and severe systolic HF (EF 0000000) due to mixed ischemic/nonischemic CM he is s/p HM IILVAD implant for destination therapy on October 19, 2013. His course was complicated by VT, syncope, A fib/Aflutter and persistent low output felt to be due to RHF. On November 16, 2013 he had a RHC due to increased PI events and fatigue. RHC showed low cardiac output but no evidence of RV failure on Milrinone. He was felt to be volume depleted. He was discharged home on 11/18/13 on mirlinone and PRN lasix 20 mg for a weight of 200 pounds or greater.   Follow up:  He presents for post-hospital f/u. Says he is feeling much better since discharge; denies any orthostatic syncope, dizziness, or lightheadedness.  States his activity level has increased since discharge home. Does report SOB with incline.  No pedal edema or abdominal distention noted.  He has not taken any Lasix since last clinic visit. Weight down 2 lbs. Denies orthopnea, PND, bleeding or syncope.No problems with equipment or driveline.   MAP eleveated at 104  Hgb 11.0 > 10.6  Denies LVAD alarms.  Denies driveline trauma, erythema or drainage.  Denies ICD shocks.    Reports taking Coumadin as prescribed and adherence to anticoagulation based dietary restrictions.  Denies bright red blood per rectum or melena, no dark urine or hematuria.     Past Medical History  Diagnosis Date  . CAD (coronary artery disease)     s/p MI 1982;  s/p DES to CFX 2011;  s/p NSTEMI 4/12 in setting of VTach;  cath 7/12:  LM ok, LAD mid 30-40%, Dx's with 40%, mCFX stent patent, prox to mid RCA occluded with R to R and L to R collats.  Medical Tx was continued  . Bradycardia   . Ventricular tachycardia     s/p AICD;   h/o ICD shock;  Amiodarone Rx. S/P ablation in 2012  . PVD (peripheral vascular disease)     h/o claudication;  s/p R fem to pop BPG 3/11;  s/p L CFA BPG 7/03;  s/p repair  of inf AAA 6/03;  s/p aorta to bilat renal BPG 6/03  . HTN (hypertension)   . HLD (hyperlipidemia)   . Cytopenia   . Hypothyroidism   . Renal insufficiency   . Claudication   . Chronic systolic heart failure   . Ischemic cardiomyopathy     echo 7/12:  EF 25-30%, mild AI, mod MR, mod LAE  . TIA (transient ischemic attack) 2001  . CVD (cerebrovascular disease)     left CEA 2008  . Stroke   . Anemia   . Inappropriate therapy from implantable cardioverter-defibrillator 04/02/2013    ATP for sinus tach  SVT wavelet identified SVT but was no  passive   . Myocardial infarction 1992  . Anginal pain   . Automatic implantable cardioverter-defibrillator in situ     Medtronic Evera device serial number [id number]    . Sleep apnea     denies wearing mask (05/25/2013)  . Arthritis     "a little in my knees" (05/25/2013)  . Melanoma of ear 2013    "near left ear" (05/25/2013)    Current Outpatient Prescriptions  Medication Sig Dispense Refill  . amiodarone (PACERONE) 200 MG tablet Take 1 tablet (200 mg total) by mouth 2 (two) times daily.  60 tablet  6  . aspirin 81 MG tablet Take  81 mg by mouth daily.       [name] atorvastatin (LIPITOR) 80 MG tablet Take 80 mg by mouth daily.       [name] CALCIUM-MAGNESIUM-ZINC PO Take 3 tablets by mouth at bedtime. 1000 mg calcium, 400 mg magnesium, 25 mg zinc      . Digoxin 62.5 MCG TABS Take 0.0625 mg by mouth daily.  30 tablet  6  . Docusate Sodium (DSS) 100 MG CAPS Take 200 mg by mouth daily as needed.  60 each  6  . ferrous gluconate (FERGON) 324 MG tablet Take 1 tablet (324 mg total) by mouth 3 (three) times daily with meals.  90 tablet  3  . levothyroxine (SYNTHROID) 112 MCG tablet Take 1 tablet (112 mcg total) by mouth daily.      . milrinone (PRIMACOR) 20 MG/100ML SOLN infusion Inject 23.15 mcg/min into the vein continuous.  100 mL  6  . Multiple Vitamin (MULTIVITAMIN) tablet Take 1 tablet by mouth daily.       . Multiple Vitamins-Minerals (EYE VITAMINS PO)  Take 1 capsule by mouth 2 (two) times daily.       . pantoprazole (PROTONIX) 40 MG tablet Take 1 tablet (40 mg total) by mouth daily.  30 tablet  6  . ranolazine (RANEXA) 500 MG 12 hr tablet Take 1 tablet (500 mg total) by mouth 2 (two) times daily.  60 tablet  6  . warfarin (COUMADIN) 2 MG tablet As directed for INR goal 2 - 2.5  45 tablet  6  . alteplase (CATHFLO ACTIVASE) 2 MG injection 2 mg by Intracatheter route once.  1 each  0  . furosemide (LASIX) 20 MG tablet Take 20 mg Monday and Friday (hold if weight < 190 lbs)  30 tablet  6  . nitroGLYCERIN (NITROSTAT) 0.4 MG SL tablet Place 0.4 mg under the tongue every 5 (five) minutes as needed for chest pain. For chest pain      . traMADol (ULTRAM) 50 MG tablet Take 1 tablet (50 mg total) by mouth every 6 (six) hours as needed for moderate pain.  15 tablet  0   No current facility-administered medications for this encounter.    Demerol; Meperidine hcl; and Opium  REVIEW OF SYSTEMS: All systems negative except as listed in HPI, PMH and Problem list.  LVAD INTERROGATION:   HeartMate II LVAD:   Flow 5.0 liters/min Speed 8600 Power 5.0 PI 7.0 Alarms: none Events: rare PI Fixed speed: 8600 Low speed limit:  8000   I reviewed the LVAD parameters from today, and compared the results to the patient's prior recorded data.  No programming changes were made.  The LVAD is functioning within specified parameters.  The patient performs LVAD self-test daily.  LVAD interrogation was negative for any significant power changes, alarms or PI events/speed drops.  LVAD equipment check completed and is in good working order.  Back-up equipment present.   LVAD education done on emergency procedures and precautions and reviewed exit site care.    Filed Vitals:   12/17/13 1007  BP: 104/0  Pulse: 75  Height: 6' (1.829 m)  Weight: 195 lb 12.8 oz (88.814 kg)  SpO2: 99%  Last weight:  197.8   Physical Exam: GENERAL: Well appearing, male who presents to  clinic today in no acute distress. HEENT: normal  NECK: Supple, JVP 9 .  2+ bilaterally, no bruits.  No lymphadenopathy or thyromegaly appreciated.   CARDIAC:  Mechanical heart sounds with LVAD hum present.  LUNGS:  Clear to auscultation bilaterally.  ABDOMEN:  Soft, round, nontender, positive bowel sounds x4.     LVAD exit site: well-healed and incorporated.  Dressing dry and intact.  No erythema or drainage.  Stabilization device present and accurately applied.  Driveline dressing is being changed daily per sterile technique. EXTREMITIES:  Warm and dry, no cyanosis, clubbing, rash 2+ edema on RLE 1+ on LLE NEUROLOGIC:  Alert and oriented x 4.  Gait steady.  No aphasia.  No dysarthria.  Affect pleasant.     ASSESSMENT AND PLAN:   1. Chronic systolic HF status post LVAD implantation - he is doing very well with NYHA II symptoms. Volume status good today. Will decrease milrinone to 0.125 today with plan to stop Monday 12/21/13 if patient remains stable. Will check ramp echo next clinic visit. Will continue lasix 20 mg twice weekly; plan to hold if weight < 190 lbs.  2.  Anticoagulation management - INR goal 2.0-2.5.  INR therapeutic today so no change in coumadin dose, but LDH elevated. Will re-check INR Monday.  3.  HTN - This has been complicated by periods of orthostatic hypotension. MAP 104 today. Midodrine has been stopped.   4. AF - maintaining SR on amio and Ranexa per Dr. Caryl Comes.   VAD parameters reviewed personally and are stable.   Glori Bickers MD 11:06 PM

## 2013-12-24 ENCOUNTER — Ambulatory Visit (HOSPITAL_COMMUNITY): Payer: Self-pay | Admitting: *Deleted

## 2013-12-24 ENCOUNTER — Ambulatory Visit (HOSPITAL_COMMUNITY)
Admission: RE | Admit: 2013-12-24 | Discharge: 2013-12-24 | Disposition: A | Discharge status: Home / Self Care | Payer: Medicare Other | Source: Ambulatory Visit | Attending: Internal Medicine | Admitting: Internal Medicine

## 2013-12-24 ENCOUNTER — Encounter (HOSPITAL_COMMUNITY): Payer: Self-pay | Admitting: *Deleted

## 2013-12-24 ENCOUNTER — Ambulatory Visit (HOSPITAL_COMMUNITY)
Admission: RE | Admit: 2013-12-24 | Discharge: 2013-12-24 | Disposition: A | Discharge status: Home / Self Care | Payer: Medicare Other | Source: Ambulatory Visit | Attending: Family Medicine | Admitting: Family Medicine

## 2013-12-24 ENCOUNTER — Other Ambulatory Visit (HOSPITAL_COMMUNITY): Payer: Self-pay | Admitting: *Deleted

## 2013-12-24 VITALS — BP 116/0 | HR 75 | Ht 72.0 in | Wt 194.0 lb

## 2013-12-24 DIAGNOSIS — Z7901 Long term (current) use of anticoagulants: Secondary | ICD-10-CM

## 2013-12-24 DIAGNOSIS — I420 Dilated cardiomyopathy: Secondary | ICD-10-CM

## 2013-12-24 DIAGNOSIS — Z95811 Presence of heart assist device: Secondary | ICD-10-CM

## 2013-12-24 DIAGNOSIS — I5022 Chronic systolic (congestive) heart failure: Secondary | ICD-10-CM

## 2013-12-24 DIAGNOSIS — I10 Essential (primary) hypertension: Secondary | ICD-10-CM

## 2013-12-24 LAB — COMPREHENSIVE METABOLIC PANEL
ALT: 20 U/L (ref 0–53)
AST: 29 U/L (ref 0–37)
Albumin: 3.3 g/dL — ABNORMAL LOW (ref 3.5–5.2)
Alkaline Phosphatase: 112 U/L (ref 39–117)
BILIRUBIN TOTAL: 0.8 mg/dL (ref 0.3–1.2)
BUN: 16 mg/dL (ref 6–23)
CALCIUM: 8.9 mg/dL (ref 8.4–10.5)
CHLORIDE: 98 meq/L (ref 96–112)
CO2: 27 meq/L (ref 19–32)
Creatinine, Ser: 1.2 mg/dL (ref 0.50–1.35)
GFR calc Af Amer: 67 mL/min — ABNORMAL LOW (ref >90)
GFR calc non Af Amer: 58 mL/min — ABNORMAL LOW (ref >90)
Glucose, Bld: 91 mg/dL (ref 70–99)
Potassium: 4.8 mEq/L (ref 3.7–5.3)
SODIUM: 136 meq/L — AB (ref 137–147)
Total Protein: 6.6 g/dL (ref 6.0–8.3)

## 2013-12-24 LAB — CBC
HCT: 33.7 % — ABNORMAL LOW (ref 39.0–52.0)
Hemoglobin: 10.8 g/dL — ABNORMAL LOW (ref 13.0–17.0)
MCH: 31.2 pg (ref 26.0–34.0)
MCHC: 32 g/dL (ref 30.0–36.0)
MCV: 97.4 fL (ref 78.0–100.0)
PLATELETS: 210 10*3/uL (ref 150–400)
RBC: 3.46 MIL/uL — AB (ref 4.22–5.81)
RDW: 18.9 % — ABNORMAL HIGH (ref 11.5–15.5)
WBC: 5.4 10*3/uL (ref 4.0–10.5)

## 2013-12-24 LAB — PROTIME-INR
INR: 2.06 — AB (ref 0.00–1.49)
Prothrombin Time: 22.6 seconds — ABNORMAL HIGH (ref 11.6–15.2)

## 2013-12-24 LAB — LACTATE DEHYDROGENASE: LDH: 483 U/L — ABNORMAL HIGH (ref 94–250)

## 2013-12-24 LAB — PRO B NATRIURETIC PEPTIDE: Pro B Natriuretic peptide (BNP): 5372 pg/mL — ABNORMAL HIGH (ref 0–125)

## 2013-12-24 MED ORDER — AMLODIPINE BESYLATE 5 MG PO TABS: 5.0000 mg | ORAL_TABLET | Freq: Every day | ORAL | Status: DC

## 2013-12-24 NOTE — Progress Notes (Signed)
Echocardiogram 2D Echocardiogram limited has been performed.  [name] 12/24/2013, 3:11 PM

## 2013-12-24 NOTE — Progress Notes (Signed)
Symptom  Yes  No  Details   Angina        x Activity:   Claudication        x How far:   Syncope        x When:     Stroke        x        TIA  2001  Orthopnea         x How many pillows: 1  PND         x How often:  CPAP       N/A How many hrs:   Pedal edema               x    Abd fullness               x    N&V               x  good appetite; chronic nausea - less episodes  Diaphoresis         x When:   Bleeding              x   Urine color        yellow  SOB       x      Activity:  Incline  Palpitations        x When:  ICD shock        x   Hospitlizaitons             x When/where/why:   ED visit        x When/where/why:  Other MD        x When/who/why:  Activity     Increasing; planned PT 2 x week; added recumbent bike  Fluid    No limitations  < 2000   Diet    Low salt food   Vital signs: HR: 75 MAP BP:  116 - 120 O2 Sat:  99 Wt:  194 Last wt: 197.8 Ht: 6'  LVAD interrogation reveals:  Speed:   8600 Flow:  5.0 Power:   4.6 PI:  7.7 Alarms:  none Events:  none Fixed speed:  8600 Low speed limit:  8000  LVAD exit site: [name]  VAD dressing removed and site care performed using sterile technique. Drive line exit site cleaned with Chlora prep applicators x 2, allowed to dry, and gauze dressing with aquacel strip re-applied. Exit site has complete tissue ingrowth, the velour is fully implanted at exit site. Less erythematous under aquacel strip; no tenderness, drainage, or foul odor noted. Drive line anchor in place. Pt denies fever or chills. Driveline dressing is being changed every third day per sterile technique using gauze dressing.  Will advance to weekly dressing changes using weekly dressing kits. Demonstrated dressing change using Sorbaview dressing/bio patch; [name] verbalized understanding of same.  Pt/caregiver deny any alarms or VAD equipment issues. VAD coordinator reviewed daily log from home for daily temperature, weight, and VAD parameters. Pt is performing  daily controller and system monitor self tests along with completing weekly and monthly maintenance for LVAD equipment.   LVAD equipment check completed and is in good working order. Back-up equipment present. LVAD education done on emergency procedures and precautions and reviewed exit site care.    Ramp study performed in echo lab:    Speed  Flow  PI  Power  LVIDD  AI  Aortic openings  MR  TR  Septum  RV   8600  4.7  7.3  4.7  6.8  none  Min opening 5/5  none  none  midline  Mild - mod HK   8800  6.5  6.8  5.5  6.7  none  Min opening 5/5  none  none  Slight bow to left  Mild - mod HK   9000  5.0  7.2  5.2  6.6  none  Min opening 5/5  none  none  Slight bow to left  Mild - mod HK   9200  5.4  7.5  5.6  6.3  none  0/5  none  none  Bowing to left  Mild - mod HK                            Doppler MAP: 116 - 120  Ramp ECHO performed per Dr. Haroldine Laws.  At completion of ramp study, patients primary and back up controller programmed:  Fixed speed: 9000 Low speed limit: 8600  Right upper arm PICC line dc'd by Dr. Haroldine Laws; gauze dressing placed on site - no bleeding or hematoma noted.

## 2013-12-24 NOTE — Progress Notes (Signed)
Speed  Flow  PI  Power  LVIDD  AI  Aortic openings  MR  TR  Septum  RV   8600  4.7 7.3 4.7 6.8 none Min opening 5/5  none none midline Mild - mod HK   8800  6.5 6.8 5.5 6.7 none Min opening 5/5 none none Slight bow to left Mild - mod HK  9000  5.0 7.2 5.2 6.6 none Min opening 5/5 none none Slight bow to left Mild - mod HK  9200  5.4 7.5 5.6 6.3 none 0/5 none none Bowing to left Mild - mod HK                             Doppler MAP: 116 - 120   Ramp ECHO performed at bedside per  At completion of ramp study, patients primary and back up controller programmed:     Fixed speed: Low speed limit:

## 2013-12-24 NOTE — Patient Instructions (Signed)
1.  No change in coumadin dose 2.  Start Norvasc 5 mg daily 3.  Re-check labs next week 4.  Advance dressing change to weekly 5.  Return to [place] clinic in two weeks

## 2013-12-29 NOTE — Progress Notes (Signed)
ADVANCED HF CLINIC NOTE  Subjective:  Eural Hovorka is a 73 yo male with h/o VT, PAD and severe systolic HF (EF 0000000) due to mixed ischemic/nonischemic CM he is s/p HM IILVAD implant for destination therapy on October 19, 2013. His course was complicated by VT, syncope, A fib/Aflutter and persistent low output felt to be due to RHF. On November 16, 2013 he had a RHC due to increased PI events and fatigue. RHC showed low cardiac output but no evidence of RV failure on Milrinone. He was felt to be volume depleted. He was discharged home on 11/18/13 on mirlinone and PRN lasix 20 mg for a weight of 200 pounds or greater.   Follow up:  He presents for one week f/u. Says he is feeling much better since discharge; denies any orthostatic syncope, dizziness, or lightheadedness. Milrinone weaned to 0.125 mcg 12/17/13 and stopped 12/21/13. Pt denies any heart failure symptoms.  States his activity level and stamina have continued to increase since last clinic visit. Does report SOB with incline.  No pedal edema or abdominal distention noted.  He has not taken any Lasix since last clinic visit. Weight down 2 lbs. Denies orthopnea, PND, bleeding or syncope.No problems with equipment or driveline.   MAP eleveated at 116; re-checked at 120  Hgb 11.0 > 10.6 > 10.8   LDH 557 > 483  Denies LVAD alarms.  Denies driveline trauma, erythema or drainage.  Denies ICD shocks.    Reports taking Coumadin as prescribed and adherence to anticoagulation based dietary restrictions.  Denies bright red blood per rectum or melena, no dark urine or hematuria.     Past Medical History  Diagnosis Date  . CAD (coronary artery disease)     s/p MI 1982;  s/p DES to CFX 2011;  s/p NSTEMI 4/12 in setting of VTach;  cath 7/12:  LM ok, LAD mid 30-40%, Dx's with 40%, mCFX stent patent, prox to mid RCA occluded with R to R and L to R collats.  Medical Tx was continued  . Bradycardia   . Ventricular tachycardia     s/p AICD;   h/o ICD  shock;  Amiodarone Rx. S/P ablation in 2012  . 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 HTN (hypertension)   . HLD (hyperlipidemia)   . Cytopenia   . Hypothyroidism   . Renal insufficiency   . Claudication   . Chronic systolic heart failure   . Ischemic cardiomyopathy     echo 7/12:  EF 25-30%, mild AI, mod MR, mod LAE  . TIA (transient ischemic attack) 2001  . CVD (cerebrovascular disease)     left CEA 2008  . Stroke   . Anemia   . Inappropriate therapy from implantable cardioverter-defibrillator 04/02/2013    ATP for sinus tach  SVT wavelet identified SVT but was no  passive   . Myocardial infarction 1992  . Anginal pain   . Automatic implantable cardioverter-defibrillator in situ     Medtronic Evera device serial number [id number]    . Sleep apnea     denies wearing mask (05/25/2013)  . Arthritis     "a little in my knees" (05/25/2013)  . Melanoma of ear 2013    "near left ear" (05/25/2013)    Current Outpatient Prescriptions  Medication Sig Dispense  Refill  . amiodarone (PACERONE) 200 MG tablet Take 1 tablet (200 mg total) by mouth 2 (two) times daily.  60 tablet  6  . aspirin 81 MG tablet Take 81 mg by mouth daily.       [name] atorvastatin (LIPITOR) 80 MG tablet Take 80 mg by mouth daily.       [name] CALCIUM-MAGNESIUM-ZINC PO Take 3 tablets by mouth at bedtime. 1000 mg calcium, 400 mg magnesium, 25 mg zinc      . Digoxin 62.5 MCG TABS Take 0.0625 mg by mouth daily.  30 tablet  6  . Docusate Sodium (DSS) 100 MG CAPS Take 200 mg by mouth daily as needed.  60 each  6  . ferrous gluconate (FERGON) 324 MG tablet Take 1 tablet (324 mg total) by mouth 3 (three) times daily with meals.  90 tablet  3  . furosemide (LASIX) 20 MG tablet Take 20 mg Monday and Friday (hold if weight < 190 lbs)  30 tablet  6  . levothyroxine (SYNTHROID) 112 MCG tablet Take 1 tablet  (112 mcg total) by mouth daily.      . Multiple Vitamin (MULTIVITAMIN) tablet Take 1 tablet by mouth daily.       . Multiple Vitamins-Minerals (EYE VITAMINS PO) Take 1 capsule by mouth 2 (two) times daily.       . pantoprazole (PROTONIX) 40 MG tablet Take 1 tablet (40 mg total) by mouth daily.  30 tablet  6  . ranolazine (RANEXA) 500 MG 12 hr tablet Take 1 tablet (500 mg total) by mouth 2 (two) times daily.  60 tablet  6  . warfarin (COUMADIN) 2 MG tablet As directed for INR goal 2 - 2.5  45 tablet  6  . amLODipine (NORVASC) 5 MG tablet Take 1 tablet (5 mg total) by mouth daily.  90 tablet  3  . nitroGLYCERIN (NITROSTAT) 0.4 MG SL tablet Place 0.4 mg under the tongue every 5 (five) minutes as needed for chest pain. For chest pain      . traMADol (ULTRAM) 50 MG tablet Take 1 tablet (50 mg total) by mouth every 6 (six) hours as needed for moderate pain.  15 tablet  0   No current facility-administered medications for this encounter.    Demerol; Meperidine hcl; and Opium  REVIEW OF SYSTEMS: All systems negative except as listed in HPI, PMH and Problem list.  LVAD INTERROGATION:   HeartMate II LVAD:   Flow 5.0 liters/min Speed 8600 Power 4.6 PI 7.7 Alarms: none Events: none Fixed speed: 8600 Low speed limit:  8000   I reviewed the LVAD parameters from today, and compared the results to the patient's prior recorded data.  No programming changes were made.  The LVAD is functioning within specified parameters.  The patient performs LVAD self-test daily.  LVAD interrogation was negative for any significant power changes, alarms or PI events/speed drops.  LVAD equipment check completed and is in good working order.  Back-up equipment present.   LVAD education done on emergency procedures and precautions and reviewed exit site care.    Filed Vitals:   12/24/13 1317  BP: 116/0  Pulse: 75  Height: 6' (1.829 m)  Weight: 194 lb (87.998 kg)  SpO2: 99%  Last weight:  195.12   Physical  Exam: GENERAL: Well appearing, male who presents to clinic today in no acute distress. HEENT: normal  NECK: Supple, JVP 9 .  2+ bilaterally, no bruits.  No lymphadenopathy or thyromegaly appreciated.  CARDIAC:  Mechanical heart sounds with LVAD hum present.  LUNGS:  Clear to auscultation bilaterally.  ABDOMEN:  Soft, round, nontender, positive bowel sounds x4.     LVAD exit site: well-healed and incorporated.  Dressing dry and intact.  No erythema or drainage.  Stabilization device present and accurately applied.  Driveline dressing is being changed daily per sterile technique. EXTREMITIES:  Warm and dry, no cyanosis, clubbing, rash 2+ edema on RLE 1+ on LLE NEUROLOGIC:  Alert and oriented x 4.  Gait steady.  No aphasia.  No dysarthria.  Affect pleasant.     ASSESSMENT AND PLAN:   1. Chronic systolic HF status post LVAD implantation - he is doing very well with NYHA II symptoms. Volume status good today. Milrinone off Monday 12/21/13. Ramp echo performed today (see results in separate note). VAD speed increased to 9000. Mildly volume overloaded.  Will start Lasix 20 mg once weekly, pt has not taken any in last week.  2.  Anticoagulation management - INR goal 2.0-2.5.  INR therapeutic today so no change in coumadin dose,  LDH down to 483 from 557 today. Will re-check INR next week.  3.  HTN - This has been complicated by periods of orthostatic hypotension. MAP 116 - 120 today; will start Norvasc 5 mg daily and check BMP next week.   4. AF - maintaining SR on amio and Ranexa per Dr. Caryl Comes.   VAD parameters reviewed personally and are stable.   Glori Bickers MD 4:41 PM

## 2013-12-29 NOTE — Addendum Note (Signed)
Encounter addended by: Jolaine Artist, MD on: 12/29/2013  4:43 PM<BR>     Documentation filed: Charges VN, Follow-up Section, LOS Section

## 2013-12-30 ENCOUNTER — Ambulatory Visit (HOSPITAL_COMMUNITY): Payer: Self-pay | Admitting: *Deleted

## 2013-12-30 LAB — PROTIME-INR: INR: 2.4 — AB (ref <=1.1)

## 2014-01-07 ENCOUNTER — Other Ambulatory Visit (HOSPITAL_COMMUNITY): Payer: Self-pay | Admitting: *Deleted

## 2014-01-07 ENCOUNTER — Encounter (HOSPITAL_COMMUNITY): Payer: Medicare Other

## 2014-01-07 DIAGNOSIS — Z95811 Presence of heart assist device: Secondary | ICD-10-CM

## 2014-01-07 DIAGNOSIS — Z7901 Long term (current) use of anticoagulants: Secondary | ICD-10-CM

## 2014-01-08 ENCOUNTER — Ambulatory Visit (HOSPITAL_COMMUNITY): Payer: Self-pay | Admitting: *Deleted

## 2014-01-08 ENCOUNTER — Ambulatory Visit (HOSPITAL_COMMUNITY)
Admission: RE | Admit: 2014-01-08 | Discharge: 2014-01-08 | Disposition: A | Discharge status: Home / Self Care | Payer: Medicare Other | Source: Ambulatory Visit | Attending: Internal Medicine | Admitting: Internal Medicine

## 2014-01-08 VITALS — Ht 72.0 in | Wt 191.0 lb

## 2014-01-08 DIAGNOSIS — I420 Dilated cardiomyopathy: Secondary | ICD-10-CM

## 2014-01-08 DIAGNOSIS — I10 Essential (primary) hypertension: Secondary | ICD-10-CM | POA: Insufficient documentation

## 2014-01-08 DIAGNOSIS — I471 Supraventricular tachycardia: Secondary | ICD-10-CM

## 2014-01-08 DIAGNOSIS — Z7901 Long term (current) use of anticoagulants: Secondary | ICD-10-CM | POA: Insufficient documentation

## 2014-01-08 DIAGNOSIS — I2510 Atherosclerotic heart disease of native coronary artery without angina pectoris: Secondary | ICD-10-CM

## 2014-01-08 DIAGNOSIS — Z95811 Presence of heart assist device: Secondary | ICD-10-CM

## 2014-01-08 DIAGNOSIS — I5022 Chronic systolic (congestive) heart failure: Secondary | ICD-10-CM | POA: Insufficient documentation

## 2014-01-08 DIAGNOSIS — I4891 Unspecified atrial fibrillation: Secondary | ICD-10-CM

## 2014-01-08 DIAGNOSIS — Z48812 Encounter for surgical aftercare following surgery on the circulatory system: Secondary | ICD-10-CM

## 2014-01-08 DIAGNOSIS — I739 Peripheral vascular disease, unspecified: Secondary | ICD-10-CM

## 2014-01-08 DIAGNOSIS — I5023 Acute on chronic systolic (congestive) heart failure: Secondary | ICD-10-CM

## 2014-01-08 DIAGNOSIS — I255 Ischemic cardiomyopathy: Secondary | ICD-10-CM

## 2014-01-08 DIAGNOSIS — I50810 Right heart failure, unspecified: Secondary | ICD-10-CM

## 2014-01-08 LAB — BASIC METABOLIC PANEL
BUN: 17 mg/dL (ref 6–23)
CHLORIDE: 101 meq/L (ref 96–112)
CO2: 27 mEq/L (ref 19–32)
Calcium: 9.2 mg/dL (ref 8.4–10.5)
Creatinine, Ser: 1.22 mg/dL (ref 0.50–1.35)
GFR calc Af Amer: 66 mL/min — ABNORMAL LOW (ref >90)
GFR calc non Af Amer: 57 mL/min — ABNORMAL LOW (ref >90)
Glucose, Bld: 99 mg/dL (ref 70–99)
POTASSIUM: 4.3 meq/L (ref 3.7–5.3)
Sodium: 138 mEq/L (ref 137–147)

## 2014-01-08 LAB — LACTATE DEHYDROGENASE: LDH: 381 U/L — AB (ref 94–250)

## 2014-01-08 LAB — CBC
HCT: 32.8 % — ABNORMAL LOW (ref 39.0–52.0)
Hemoglobin: 10.6 g/dL — ABNORMAL LOW (ref 13.0–17.0)
MCH: 31 pg (ref 26.0–34.0)
MCHC: 32.3 g/dL (ref 30.0–36.0)
MCV: 95.9 fL (ref 78.0–100.0)
PLATELETS: 190 10*3/uL (ref 150–400)
RBC: 3.42 MIL/uL — ABNORMAL LOW (ref 4.22–5.81)
RDW: 16.9 % — ABNORMAL HIGH (ref 11.5–15.5)
WBC: 6.8 10*3/uL (ref 4.0–10.5)

## 2014-01-08 LAB — PROTIME-INR
INR: 2.09 — AB (ref 0.00–1.49)
Prothrombin Time: 22.8 seconds — ABNORMAL HIGH (ref 11.6–15.2)

## 2014-01-08 LAB — PRO B NATRIURETIC PEPTIDE: PRO B NATRI PEPTIDE: 3277 pg/mL — AB (ref 0–125)

## 2014-01-08 MED ORDER — DIGOXIN 62.5 MCG PO TABS: 0.0625 mg | ORAL_TABLET | Freq: Every day | ORAL | Status: DC

## 2014-01-08 MED ORDER — PANTOPRAZOLE SODIUM 40 MG PO TBEC: 40.0000 mg | DELAYED_RELEASE_TABLET | Freq: Every day | ORAL | Status: DC

## 2014-01-08 MED ORDER — AMLODIPINE BESYLATE 10 MG PO TABS: ORAL_TABLET | ORAL | Status: DC

## 2014-01-08 NOTE — Patient Instructions (Addendum)
1.  Increase Norvasc to 10 mg daily 2.  Stop lasix 3.  Return to [place] clinic two weeks.

## 2014-01-08 NOTE — Progress Notes (Signed)
Symptom  Yes  No  Details   Angina        x Activity:   Claudication        x How far:   Syncope        x When:     Stroke        x        TIA  2001  Orthopnea         x How many pillows: 1  PND         x How often:  CPAP       N/A How many hrs:   Pedal edema               x    Abd fullness               x    N&V               x  good appetite; chronic nausea - less episodes  Diaphoresis         x When:   Bleeding              x   Urine color        yellow  SOB       x      Activity:  Incline  Palpitations        x When:  ICD shock        x   Hospitlizaitons             x When/where/why:   ED visit        x When/where/why:  Other MD        x  When/who/why:  Dr. Harl Bowie (opthamologist) 01/07/14  Activity     Home PT signed off last week. Home recumbent bike; walking.  Fluid    No limitations  < 2000   Diet    Low salt food   Vital signs: HR: 75 MAP BP:  120; re-checked 100 O2 Sat:  100 Wt:  191 lbs Last wt: 194 lbs Ht: 6'  LVAD interrogation reveals:  Speed:   9000 RPM Flow:  4.5 Power:   5.0 PI:  7.9 Alarms:  none Events:  Rare PI Fixed speed:  9000 Low speed limit:  8400  LVAD exit site: [name]  VAD dressing removed and site care performed using sterile technique. Drive line exit site cleaned with Chlora prep applicators x 2, allowed to dry, and Sorbvaview dressing with Biopatch re-applied. Exit site has near complete tissue ingrowth, the velour is fully implanted at exit site. Some erythema under Biopatch; no tenderness, drainage, or foul odor noted. Drive line anchor in place. Pt denies fever or chills. Driveline dressing is being changed every week per sterile technique using Sorbaview dressing.  Will hold off on showers until re-assessment of site next clinic visit.  Pt/caregiver deny any alarms or VAD equipment issues. VAD coordinator reviewed daily log from home for daily temperature, weight, and VAD parameters. Pt is performing daily controller and system monitor self  tests along with completing weekly and monthly maintenance for LVAD equipment.   LVAD equipment check completed and is in good working order. Back-up equipment present. LVAD education done on emergency procedures and precautions and reviewed exit site care.

## 2014-01-11 NOTE — Progress Notes (Signed)
ADVANCED HF CLINIC NOTE  Subjective:  [name] is a 73 yo male with h/o VT, PAD and severe systolic HF (EF 0000000) due to mixed ischemic/nonischemic CM he is s/p HM IILVAD implant for destination therapy on October 19, 2013. His course was complicated by VT, syncope, A fib/Aflutter and persistent low output felt to be due to RHF. On November 16, 2013 he had a RHC due to increased PI events and fatigue. RHC showed low cardiac output but no evidence of RV failure on Milrinone. He was felt to be volume depleted. He was discharged home on 11/18/13 on mirlinone and PRN lasix 20 mg for a weight of 200 pounds or greater.   Follow up:  He presents for two week f/u visit. Continues to feel much better  States his activity level is increasing; home health physical therapy ended last week, but he has been walking and started recumbent bike at home.  Denies need for formal cardiac rehab at this time. Denies any orthostatic syncope, dizziness, or lightheadedness.Does report SOB with incline.  No pedal edema or abdominal distention noted.  He decreased his lasix from twice weekly to once weekly since last clinic visit.  Weight down 3 lbs. Denies orthopnea, PND, bleeding or syncope.No problems with equipment or driveline.   MAP elevated at 120; re-checked at 100  Hgb 11.0 > 10.6 > 10.8 > 10.6  Denies LVAD alarms.  Denies driveline trauma, erythema or drainage.  Denies ICD shocks.    Reports taking Coumadin as prescribed and adherence to anticoagulation based dietary restrictions.  Denies bright red blood per rectum or melena, no dark urine or hematuria.     Past Medical History  Diagnosis Date  . CAD (coronary artery disease)     s/p MI 1982;  s/p DES to CFX 2011;  s/p NSTEMI 4/12 in setting of VTach;  cath 7/12:  LM ok, LAD mid 30-40%, Dx's with 40%, mCFX stent patent, prox to mid RCA occluded with R to R and L to R collats.  Medical Tx was continued  . Bradycardia   . Ventricular tachycardia     s/p  AICD;   h/o ICD shock;  Amiodarone Rx. S/P ablation in 2012  . 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 HTN (hypertension)   . HLD (hyperlipidemia)   . Cytopenia   . Hypothyroidism   . Renal insufficiency   . Claudication   . Chronic systolic heart failure   . Ischemic cardiomyopathy     echo 7/12:  EF 25-30%, mild AI, mod MR, mod LAE  . TIA (transient ischemic attack) 2001  . CVD (cerebrovascular disease)     left CEA 2008  . Stroke   . Anemia   . Inappropriate therapy from implantable cardioverter-defibrillator 04/02/2013    ATP for sinus tach  SVT wavelet identified SVT but was no  passive   . Myocardial infarction 1992  . Anginal pain   . Automatic implantable cardioverter-defibrillator in situ     Medtronic Evera device serial number [id number]    . Sleep apnea     denies wearing mask (05/25/2013)  . Arthritis     "a little in my knees" (05/25/2013)  . Melanoma of ear 2013    "near left ear" (05/25/2013)    Current Outpatient Prescriptions  Medication Sig Dispense Refill  . amiodarone (PACERONE) 200 MG tablet Take 1 tablet (200 mg total) by mouth 2 (two) times daily.  60 tablet  6  . amLODipine (NORVASC) 10 MG tablet Take one tab daily  60 tablet  6  . aspirin 81 MG tablet Take 81 mg by mouth daily.       [name] atorvastatin (LIPITOR) 80 MG tablet Take 80 mg by mouth daily.       [name] CALCIUM-MAGNESIUM-ZINC PO Take 3 tablets by mouth at bedtime. 1000 mg calcium, 400 mg magnesium, 25 mg zinc      . Digoxin 62.5 MCG TABS Take 0.0625 mg by mouth daily.  30 tablet  6  . Docusate Sodium (DSS) 100 MG CAPS Take 200 mg by mouth daily as needed.  60 each  6  . ferrous gluconate (FERGON) 324 MG tablet Take 1 tablet (324 mg total) by mouth 3 (three) times daily with meals.  90 tablet  3  . levothyroxine (SYNTHROID) 112 MCG tablet Take 1 tablet (112 mcg total) by  mouth daily.      . Multiple Vitamin (MULTIVITAMIN) tablet Take 1 tablet by mouth daily.       . Multiple Vitamins-Minerals (EYE VITAMINS PO) Take 1 capsule by mouth 2 (two) times daily.       . pantoprazole (PROTONIX) 40 MG tablet Take 1 tablet (40 mg total) by mouth daily.  30 tablet  6  . ranolazine (RANEXA) 500 MG 12 hr tablet Take 1 tablet (500 mg total) by mouth 2 (two) times daily.  60 tablet  6  . warfarin (COUMADIN) 2 MG tablet As directed for INR goal 2 - 2.5  45 tablet  6  . nitroGLYCERIN (NITROSTAT) 0.4 MG SL tablet Place 0.4 mg under the tongue every 5 (five) minutes as needed for chest pain. For chest pain      . traMADol (ULTRAM) 50 MG tablet Take 1 tablet (50 mg total) by mouth every 6 (six) hours as needed for moderate pain.  15 tablet  0   No current facility-administered medications for this encounter.    Demerol; Meperidine hcl; and Opium  REVIEW OF SYSTEMS: All systems negative except as listed in HPI, PMH and Problem list.  LVAD INTERROGATION:   HeartMate II LVAD:   Flow 4.5 liters/min Speed 9000 Power 5.0 PI 7.9 Alarms: none Events: rare PI Fixed speed: 9000 Low speed limit:  8400  I reviewed the LVAD parameters from today, and compared the results to the patient's prior recorded data.  No programming changes were made.  The LVAD is functioning within specified parameters.  The patient performs LVAD self-test daily.  LVAD interrogation was negative for any significant power changes, alarms or PI events/speed drops.  LVAD equipment check completed and is in good working order.  Back-up equipment present.   LVAD education done on emergency procedures and precautions and reviewed exit site care.    Filed Vitals:   01/08/14 1000  Height: 6' (1.829 m)  Weight: 191 lb (86.637 kg)  Last weight:  194 lbs HR:  75 O2 sat: 100 MAP:  120; re-checked 100   Physical Exam: GENERAL: Well appearing, male who presents to clinic today in no acute distress. HEENT: normal   NECK: Supple, JVP 9 .  2+ bilaterally, no bruits.  No lymphadenopathy or thyromegaly appreciated.   CARDIAC:  Mechanical heart sounds with LVAD hum present.  LUNGS:  Clear to auscultation bilaterally.  ABDOMEN:  Soft, round, nontender,  positive bowel sounds x4.     LVAD exit site: well-healed and incorporated.  Dressing dry and intact.  No erythema or drainage.  Stabilization device present and accurately applied.  Driveline dressing is being changed daily per sterile technique. EXTREMITIES:  Warm and dry, no cyanosis, clubbing, rash 2+ edema on RLE 1+ on LLE NEUROLOGIC:  Alert and oriented x 4.  Gait steady.  No aphasia.  No dysarthria.  Affect pleasant.     ASSESSMENT AND PLAN:   1. Chronic systolic HF status post LVAD implantation - he is doing very well with NYHA II symptoms. Volume status good today. Off milrinone, ramp echo last visit with speed increrase to 9000 RPM, no PI events noted. Will stop Lasix.  2.  Anticoagulation management - INR goal 2.0-2.5.  INR therapeutic today so no change in coumadin dose.  3.  HTN - Started Norvasc 5 mg last clinic visit; MAP 120 today, repeated 100. Will increase Norvasc to 10 mg. If MAP elevated next clinic visit, plan to start Altace 2.5 mg.   4. AF - maintaining SR on amio and Ranexa per Dr. Caryl Comes.   Return two weeks.  VAD parameters reviewed personally and are stable.   Glori Bickers MD 2:10 PM

## 2014-01-20 ENCOUNTER — Encounter: Payer: Self-pay | Admitting: Internal Medicine

## 2014-01-20 ENCOUNTER — Other Ambulatory Visit (HOSPITAL_COMMUNITY): Payer: Self-pay | Admitting: *Deleted

## 2014-01-20 DIAGNOSIS — Z95811 Presence of heart assist device: Secondary | ICD-10-CM

## 2014-01-20 DIAGNOSIS — Z7901 Long term (current) use of anticoagulants: Secondary | ICD-10-CM

## 2014-01-21 ENCOUNTER — Encounter: Payer: Self-pay | Admitting: Licensed Clinical Social Worker

## 2014-01-21 ENCOUNTER — Other Ambulatory Visit (HOSPITAL_COMMUNITY): Payer: Self-pay | Admitting: *Deleted

## 2014-01-21 ENCOUNTER — Ambulatory Visit (HOSPITAL_COMMUNITY): Payer: Self-pay | Admitting: *Deleted

## 2014-01-21 ENCOUNTER — Ambulatory Visit (HOSPITAL_COMMUNITY)
Admission: RE | Admit: 2014-01-21 | Discharge: 2014-01-21 | Disposition: A | Discharge status: Home / Self Care | Payer: Medicare Other | Source: Ambulatory Visit | Attending: Internal Medicine | Admitting: Internal Medicine

## 2014-01-21 VITALS — BP 84/0 | HR 75 | Ht 72.0 in | Wt 191.0 lb

## 2014-01-21 DIAGNOSIS — Z7901 Long term (current) use of anticoagulants: Secondary | ICD-10-CM

## 2014-01-21 DIAGNOSIS — Z95811 Presence of heart assist device: Secondary | ICD-10-CM

## 2014-01-21 DIAGNOSIS — I5022 Chronic systolic (congestive) heart failure: Secondary | ICD-10-CM | POA: Insufficient documentation

## 2014-01-21 LAB — COMPREHENSIVE METABOLIC PANEL
ALBUMIN: 3.6 g/dL (ref 3.5–5.2)
ALK PHOS: 115 U/L (ref 39–117)
ALT: 23 U/L (ref 0–53)
AST: 28 U/L (ref 0–37)
BUN: 18 mg/dL (ref 6–23)
CALCIUM: 9.2 mg/dL (ref 8.4–10.5)
CO2: 25 mEq/L (ref 19–32)
Chloride: 99 mEq/L (ref 96–112)
Creatinine, Ser: 1.18 mg/dL (ref 0.50–1.35)
GFR calc non Af Amer: 59 mL/min — ABNORMAL LOW (ref >90)
GFR, EST AFRICAN AMERICAN: 68 mL/min — AB (ref >90)
GLUCOSE: 119 mg/dL — AB (ref 70–99)
Potassium: 4.5 mEq/L (ref 3.7–5.3)
SODIUM: 138 meq/L (ref 137–147)
Total Bilirubin: 0.5 mg/dL (ref 0.3–1.2)
Total Protein: 7 g/dL (ref 6.0–8.3)

## 2014-01-21 LAB — CBC
HEMATOCRIT: 35.6 % — AB (ref 39.0–52.0)
Hemoglobin: 11.6 g/dL — ABNORMAL LOW (ref 13.0–17.0)
MCH: 30.9 pg (ref 26.0–34.0)
MCHC: 32.6 g/dL (ref 30.0–36.0)
MCV: 94.7 fL (ref 78.0–100.0)
Platelets: 205 10*3/uL (ref 150–400)
RBC: 3.76 MIL/uL — ABNORMAL LOW (ref 4.22–5.81)
RDW: 16.5 % — ABNORMAL HIGH (ref 11.5–15.5)
WBC: 6.6 10*3/uL (ref 4.0–10.5)

## 2014-01-21 LAB — LACTATE DEHYDROGENASE: LDH: 297 U/L — ABNORMAL HIGH (ref 94–250)

## 2014-01-21 LAB — PROTIME-INR
INR: 1.83 — AB (ref 0.00–1.49)
PROTHROMBIN TIME: 20.6 s — AB (ref 11.6–15.2)

## 2014-01-21 LAB — PRO B NATRIURETIC PEPTIDE: Pro B Natriuretic peptide (BNP): 2603 pg/mL — ABNORMAL HIGH (ref 0–125)

## 2014-01-21 LAB — PREALBUMIN: PREALBUMIN: 19.1 mg/dL (ref 17.0–34.0)

## 2014-01-21 MED ORDER — FUROSEMIDE 20 MG PO TABS: ORAL_TABLET | ORAL | Status: DC

## 2014-01-21 NOTE — Progress Notes (Signed)
Symptom  Yes  No  Details   Angina        x Activity:   Claudication        x How far:   Syncope        x When:     Stroke        x        TIA  2001  Orthopnea         x How many pillows: 1  PND         x How often:  CPAP       N/A How many hrs:   Pedal edema               x    Abd fullness               x    N&V               x  good appetite no episodes of nausea  Diaphoresis         x When:   Bleeding              x   Urine color        yellow  SOB       x      Activity:  Incline  Palpitations        x When:  ICD shock        x   Hospitlizaitons             x When/where/why:   ED visit        x When/where/why:  Other MD              x When/who/why:   Activity        Walking; recumbent bike; household chores  Fluid    No limitations  < 2000   Diet    Low salt food   Vital signs: HR: 75 MAP BP:  84 O2 Sat:  99 Wt:  191 lbs Last wt: 191 lbs Ht: 6'  LVAD interrogation reveals:  Speed:   9000 RPM Flow:  3.7 Power:   4.7 PI:  3.1 Alarms:  none Events:  Rare PI Fixed speed:  9000 Low speed limit:  8400  LVAD exit site: [name]  VAD dressing removed and site care performed using sterile technique. Drive line exit site cleaned with Chlora prep applicators x 2, allowed to dry, and Sorbvaview dressing with Biopatch re-applied. Drive line incorporated, the velour is fully implanted at exit site. No erythema, tenderness, drainage, or foul odor noted. Drive line anchor in place. Pt denies fever or chills. Driveline dressing is being changed every week per sterile technique using Sorbaview dressing.  Pt may start showers. Shower bag instructions reviewed; advised pt to have caregiver available during shower, hydrate well before shower. Advised caregiver to change dressing if any moisture under Sorbaview window noted.  Also asked her to call VAD coordinator if any change in exit site appearance. Both verbalized understanding of same.   Pt/caregiver deny any alarms or VAD equipment issues.  VAD coordinator reviewed daily log from home for daily temperature, weight, and VAD parameters. Pt is performing daily controller and system monitor self tests along with completing weekly and monthly maintenance for LVAD equipment.   LVAD equipment check completed and is in good working order. Back-up equipment present. LVAD education reviewed using training loop with return demonstration by patient on controller change out.  Emergency procedures and precautions reviewed.  3 mos Intermacs QOL, KCCQ-12, and neurocognitive trailmaking completed.  Pt completed 800 feet in 4 minutes 15 seconds during 6 min walk; stopped due to "knee pain".

## 2014-01-21 NOTE — Progress Notes (Signed)
CSW met with patient in the clinic for follow up VAD appointment. Patient and his SO were in great spirits and stated that [name] is hopeful to return to work. Patient states that he is bored at home and ready to get back his life. [name] SO has returned to work and both seem to be back in a regular routine. Patient spoke about golf and hopefully returning once the weather gets warmer. Patient and SO appear to be coping well with new lifestyle and verbalize positive outlook ahead. Patient denies any concerns at this time. CSW will continue to follow for support as needed. [name], [place]

## 2014-01-21 NOTE — Patient Instructions (Signed)
1.  Take coumadin 3 mg today. 2.  Re-check INR at Dr. Claudette Laws office 02/04/14. 3.  May start showering; call if any change in drive line exit site. 4.  Return to [facility] one month

## 2014-01-22 ENCOUNTER — Telehealth: Payer: Self-pay | Admitting: *Deleted

## 2014-01-22 ENCOUNTER — Telehealth (HOSPITAL_COMMUNITY): Payer: Self-pay | Admitting: *Deleted

## 2014-01-22 DIAGNOSIS — I5022 Chronic systolic (congestive) heart failure: Secondary | ICD-10-CM

## 2014-01-22 NOTE — Telephone Encounter (Signed)
Per Cloyde Reams PT/INR order placed for weekly at [facility]

## 2014-01-22 NOTE — Telephone Encounter (Signed)
Called pt re: local INR's.  Dr. Claudette Laws office will be unable to perform INR's as needed unless they manage. Pt agrees to go to [facility] at [address] in [place], [place]. Standing order placed in Epic. Pt will have next INR check on 02/04/14 at this site; if any problems, he is to call VAD pager. [name] and pt verbalized understanding of same.

## 2014-01-23 NOTE — Progress Notes (Signed)
ADVANCED HF CLINIC NOTE  Subjective:  [name] is a 73 yo male with h/o VT, PAD and severe systolic HF (EF 35-32%) due to mixed ischemic/nonischemic CM he is s/p HM IILVAD implant for destination therapy on October 19, 2013. His course was complicated by VT, syncope, A fib/Aflutter and persistent low output felt to be due to RHF. RHC showed low cardiac output but no evidence of RV failure on Milrinone. He was felt to be volume depleted. He was discharged home on 11/18/13 on mirlinone and PRN lasix 20 mg for a weight of 200 pounds or greater. Milrinone has since been weaned off.   Follow up:  He presents for two week f/u visit. Continues to feel much better  States his activity level is increasing. Reports he is using recumbent bike, walking, and doing some household chores. 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 Work involves walking with frequent sitting. Denies any orthostatic syncope, dizziness, or lightheadedness. Does report SOB with incline.  No pedal edema or abdominal distention noted.  He decreased his lasix to PRN last visit and has not required any lasix dosing.  Weight stable at 191 lbs. Denies orthopnea, PND, bleeding or syncope.No problems with equipment or driveline.   MAP 80 - 84  Hgb 11.0 > 10.6 > 10.8 > 10.6 > 11.6  Denies LVAD alarms.  Denies driveline trauma, erythema or drainage.  Denies ICD shocks.    Reports taking Coumadin as prescribed and adherence to anticoagulation based dietary restrictions.  Denies bright red blood per rectum or melena, no dark urine or hematuria.     Past Medical History  Diagnosis Date  . CAD (coronary artery disease)     s/p MI 1982;  s/p DES to CFX 2011;  s/p NSTEMI 4/12 in setting of VTach;  cath 7/12:  LM ok, LAD mid 30-40%, Dx's with 40%, mCFX stent patent, prox to mid RCA occluded with R to R and L to R collats.  Medical Tx was continued  . Bradycardia   . Ventricular tachycardia    s/p AICD;   h/o ICD shock;  Amiodarone Rx. S/P ablation in 2012  . 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 HTN (hypertension)   . HLD (hyperlipidemia)   . Cytopenia   . Hypothyroidism   . Renal insufficiency   . Claudication   . Chronic systolic heart failure   . Ischemic cardiomyopathy     echo 7/12:  EF 25-30%, mild AI, mod MR, mod LAE  . TIA (transient ischemic attack) 2001  . CVD (cerebrovascular disease)     left CEA 2008  . Stroke   . Anemia   . Inappropriate therapy from implantable cardioverter-defibrillator 04/02/2013    ATP for sinus tach  SVT wavelet identified SVT but was no  passive   . Myocardial infarction 1992  . Anginal pain   . Automatic implantable cardioverter-defibrillator in situ     Medtronic Evera device serial number [id number]    . Sleep apnea     denies wearing mask (05/25/2013)  . Arthritis     "a little in my knees" (05/25/2013)  . Melanoma of ear 2013    "near left ear" (05/25/2013)    Current Outpatient Prescriptions  Medication Sig  Dispense Refill  . amiodarone (PACERONE) 200 MG tablet Take 1 tablet (200 mg total) by mouth 2 (two) times daily.  60 tablet  6  . amLODipine (NORVASC) 10 MG tablet Take one tab daily  60 tablet  6  . aspirin 81 MG tablet Take 81 mg by mouth daily.       [name] atorvastatin (LIPITOR) 80 MG tablet Take 80 mg by mouth daily.       . Digoxin 62.5 MCG TABS Take 0.0625 mg by mouth daily.  30 tablet  6  . Docusate Sodium (DSS) 100 MG CAPS Take 200 mg by mouth daily as needed.  60 each  6  . ferrous gluconate (FERGON) 324 MG tablet Take 1 tablet (324 mg total) by mouth 3 (three) times daily with meals.  90 tablet  3  . levothyroxine (SYNTHROID) 112 MCG tablet Take 1 tablet (112 mcg total) by mouth daily.      . Multiple Vitamin (MULTIVITAMIN) tablet Take 1 tablet by mouth daily.       . Multiple  Vitamins-Minerals (EYE VITAMINS PO) Take 1 capsule by mouth 2 (two) times daily.       . pantoprazole (PROTONIX) 40 MG tablet Take 1 tablet (40 mg total) by mouth daily.  30 tablet  6  . ranolazine (RANEXA) 500 MG 12 hr tablet Take 1 tablet (500 mg total) by mouth 2 (two) times daily.  60 tablet  6  . traMADol (ULTRAM) 50 MG tablet Take 1 tablet (50 mg total) by mouth every 6 (six) hours as needed for moderate pain.  15 tablet  0  . warfarin (COUMADIN) 2 MG tablet As directed for INR goal 2 - 2.5  45 tablet  6  . CALCIUM-MAGNESIUM-ZINC PO Take 3 tablets by mouth at bedtime. 1000 mg calcium, 400 mg magnesium, 25 mg zinc      . furosemide (LASIX) 20 MG tablet Use 1 x week prn for weight gain  30 tablet  3  . nitroGLYCERIN (NITROSTAT) 0.4 MG SL tablet Place 0.4 mg under the tongue every 5 (five) minutes as needed for chest pain. For chest pain       No current facility-administered medications for this encounter.    Demerol; Meperidine hcl; and Opium  REVIEW OF SYSTEMS: All systems negative except as listed in HPI, PMH and Problem list.  LVAD INTERROGATION:   HeartMate II LVAD:   Flow 3.7 liters/min Speed 9000 Power 4.7 PI 3.8 Alarms: none Events: rare PI Fixed speed: 9000 Low speed limit:  8400  I reviewed the LVAD parameters from today, and compared the results to the patient's prior recorded data.  No programming changes were made.  The LVAD is functioning within specified parameters.  The patient performs LVAD self-test daily.  LVAD interrogation was negative for any significant power changes, alarms or PI events/speed drops.  LVAD equipment check completed and is in good working order.  Back-up equipment present.   LVAD education done on emergency procedures and precautions and reviewed exit site care.    Filed Vitals:   01/21/14 1031  BP: 84/0  Pulse: 75  Height: 6' (1.829 m)  Weight: 191 lb (86.637 kg)  SpO2: 99%  Last weight:  191lbs    Physical Exam: GENERAL: Well  appearing, male who presents to clinic today in no acute distress. HEENT: normal  NECK: Supple, JVP 9 .  2+ bilaterally, no bruits.  No lymphadenopathy or thyromegaly appreciated.   CARDIAC:  Mechanical heart sounds with  LVAD hum present.  LUNGS:  Clear to auscultation bilaterally.  ABDOMEN:  Soft, round, nontender, positive bowel sounds x4.     LVAD exit site: well-healed and incorporated.  Dressing dry and intact.  No erythema or drainage.  Stabilization device present and accurately applied.  Driveline dressing is being changed daily per sterile technique. EXTREMITIES:  Warm and dry, no cyanosis, clubbing, rash 2+ edema on RLE 1+ on LLE NEUROLOGIC:  Alert and oriented x 4.  Gait steady.  No aphasia.  No dysarthria.  Affect pleasant.     ASSESSMENT AND PLAN:   1. Chronic systolic HF status post LVAD implantation - he is doing very well with NYHA II symptoms. Volume status good today. Off milrinone, 9000 RPM with no PI events noted. Off Lasix. No change today. I told him he could return to work with rest breaks as needed as long as there was no significant risk of getting into a physical altercation.  2.  Anticoagulation management - INR goal 2.0-2.5.  INR 1.83 today, coumadin dose addressed.  3.  HTN - increased Norvasc to 10 mg last clinic visit; MAP 80 - 84 today. Continue current regimen.   4. AF - maintaining SR on amio and Ranexa per Dr. Caryl Comes.   Return one month.  VAD parameters reviewed personally and are stable.   Performed 3 mos Intermacs f/u including 6 minute walk. Pt completed 800 feet in 4 mins 15 secs and stopped due to "knees hurting".   Glori Bickers MD 11:13 PM

## 2014-01-29 ENCOUNTER — Encounter: Payer: Self-pay | Admitting: Internal Medicine

## 2014-02-03 ENCOUNTER — Other Ambulatory Visit (HOSPITAL_COMMUNITY): Payer: Self-pay | Admitting: Internal Medicine

## 2014-02-03 LAB — PROTIME-INR: INR: 1.7 — AB (ref 0.9–1.1)

## 2014-02-04 ENCOUNTER — Ambulatory Visit (HOSPITAL_COMMUNITY): Payer: Self-pay | Admitting: *Deleted

## 2014-02-09 ENCOUNTER — Other Ambulatory Visit (HOSPITAL_COMMUNITY): Payer: Self-pay | Admitting: Internal Medicine

## 2014-02-09 ENCOUNTER — Ambulatory Visit (HOSPITAL_COMMUNITY): Payer: Self-pay | Admitting: *Deleted

## 2014-02-09 LAB — PROTIME-INR: INR: 2.4 — AB (ref 0.9–1.1)

## 2014-02-10 ENCOUNTER — Other Ambulatory Visit (HOSPITAL_COMMUNITY): Payer: Self-pay | Admitting: *Deleted

## 2014-02-10 DIAGNOSIS — Z7901 Long term (current) use of anticoagulants: Secondary | ICD-10-CM

## 2014-02-10 DIAGNOSIS — Z95811 Presence of heart assist device: Secondary | ICD-10-CM

## 2014-02-12 ENCOUNTER — Encounter: Payer: Self-pay | Admitting: *Deleted

## 2014-02-15 LAB — PROTIME-INR
INR: 1.7 — ABNORMAL HIGH (ref 0.8–1.2)
Prothrombin Time: 17.3 s — ABNORMAL HIGH (ref 9.1–12.0)

## 2014-02-16 ENCOUNTER — Other Ambulatory Visit (HOSPITAL_COMMUNITY): Payer: Self-pay | Admitting: *Deleted

## 2014-02-16 DIAGNOSIS — Z95811 Presence of heart assist device: Secondary | ICD-10-CM

## 2014-02-16 DIAGNOSIS — Z7901 Long term (current) use of anticoagulants: Secondary | ICD-10-CM

## 2014-02-18 ENCOUNTER — Ambulatory Visit (HOSPITAL_COMMUNITY): Payer: Self-pay | Admitting: *Deleted

## 2014-02-18 ENCOUNTER — Ambulatory Visit (HOSPITAL_COMMUNITY)
Admission: RE | Admit: 2014-02-18 | Discharge: 2014-02-18 | Disposition: A | Discharge status: Home / Self Care | Payer: Medicare Other | Source: Ambulatory Visit | Attending: Internal Medicine | Admitting: Internal Medicine

## 2014-02-18 VITALS — BP 92/0 | HR 76 | Ht 72.0 in | Wt 194.2 lb

## 2014-02-18 DIAGNOSIS — I10 Essential (primary) hypertension: Secondary | ICD-10-CM

## 2014-02-18 DIAGNOSIS — I5022 Chronic systolic (congestive) heart failure: Secondary | ICD-10-CM | POA: Insufficient documentation

## 2014-02-18 DIAGNOSIS — I4891 Unspecified atrial fibrillation: Secondary | ICD-10-CM | POA: Insufficient documentation

## 2014-02-18 DIAGNOSIS — R110 Nausea: Secondary | ICD-10-CM

## 2014-02-18 DIAGNOSIS — Z95811 Presence of heart assist device: Secondary | ICD-10-CM | POA: Insufficient documentation

## 2014-02-18 DIAGNOSIS — Z7901 Long term (current) use of anticoagulants: Secondary | ICD-10-CM

## 2014-02-18 LAB — BASIC METABOLIC PANEL
BUN: 21 mg/dL (ref 6–23)
CALCIUM: 9.3 mg/dL (ref 8.4–10.5)
CO2: 25 mEq/L (ref 19–32)
CREATININE: 1.47 mg/dL — AB (ref 0.50–1.35)
Chloride: 101 mEq/L (ref 96–112)
GFR, EST AFRICAN AMERICAN: 52 mL/min — AB (ref >90)
GFR, EST NON AFRICAN AMERICAN: 45 mL/min — AB (ref >90)
Glucose, Bld: 97 mg/dL (ref 70–99)
Potassium: 4.4 mEq/L (ref 3.7–5.3)
Sodium: 141 mEq/L (ref 137–147)

## 2014-02-18 LAB — CBC
HEMATOCRIT: 37.1 % — AB (ref 39.0–52.0)
Hemoglobin: 12.2 g/dL — ABNORMAL LOW (ref 13.0–17.0)
MCH: 30.1 pg (ref 26.0–34.0)
MCHC: 32.9 g/dL (ref 30.0–36.0)
MCV: 91.6 fL (ref 78.0–100.0)
PLATELETS: 206 10*3/uL (ref 150–400)
RBC: 4.05 MIL/uL — ABNORMAL LOW (ref 4.22–5.81)
RDW: 15.8 % — ABNORMAL HIGH (ref 11.5–15.5)
WBC: 6.1 10*3/uL (ref 4.0–10.5)

## 2014-02-18 LAB — LACTATE DEHYDROGENASE: LDH: 329 U/L — ABNORMAL HIGH (ref 94–250)

## 2014-02-18 LAB — PROTIME-INR
INR: 2.39 — ABNORMAL HIGH (ref 0.00–1.49)
Prothrombin Time: 25.3 seconds — ABNORMAL HIGH (ref 11.6–15.2)

## 2014-02-18 LAB — PRO B NATRIURETIC PEPTIDE: PRO B NATRI PEPTIDE: 2313 pg/mL — AB (ref 0–125)

## 2014-02-18 MED ORDER — AMIODARONE HCL 200 MG PO TABS: 200.0000 mg | ORAL_TABLET | Freq: Every day | ORAL | Status: DC

## 2014-02-18 MED ORDER — FUROSEMIDE 20 MG PO TABS: 20.0000 mg | ORAL_TABLET | ORAL | Status: DC

## 2014-02-18 NOTE — Patient Instructions (Addendum)
Follow up in 1 month  Take amiodarone 200 mg daily   Take lasix 20 mg every Monday and Friday   Check labs (BMP and INR) next Tuesday   F/U with Dr. Caryl Comes for device check  Wear support stockings  Do the following things EVERYDAY: 1) Weigh yourself in the morning before breakfast. Write it down and keep it in a log. 2) Take your medicines as prescribed 3) Eat low salt foods-Limit salt (sodium) to 2000 mg per day.  4) Stay as active as you can everyday 5) Limit all fluids for the day to less than 2 liters

## 2014-02-18 NOTE — Progress Notes (Signed)
Symptom  Yes  No  Details   Angina        x Activity:   Claudication        x How far:   Syncope        x When:     Stroke        x        TIA  2001  Orthopnea         x How many pillows: 1  PND         x How often:  CPAP       N/A How many hrs:   Pedal edema        x   Swelling one time - took Lasix 20 mg - resolved  Abd fullness               x    N&V               x  good appetite; N&V returned 2 weeks ago  Diaphoresis         x When:   Bleeding              x   Urine color        yellow  SOB       x      Activity:  Incline  Palpitations        x When:  ICD shock        x   Hospitlizaitons             x When/where/why:   ED visit        x When/where/why:  Other MD              x When/who/why:   Activity        Working 5 hrs/5 days week; walking 2 - 3 miles day  Fluid    No limitations  < 2000   Diet    Low salt food   Vital signs: HR: 76 MAP BP:  92 O2 Sat:  99 Wt:  194.2  lbs Last wt: 191 lbs Ht: 6'  LVAD interrogation reveals:  Speed:   9000 RPM Flow:  4.6 Power:   4.9 PI:  6.8 Alarms:  none Events:  Rare PI Fixed speed:  9000 Low speed limit:  8400  LVAD exit site: [name]  VAD dressing removed and site care performed using sterile technique. Drive line exit site cleaned with Chlora prep applicators x 2, allowed to dry, and Sorbvaview dressing with Biopatch re-applied. Drive line incorporated, the velour is fully implanted at exit site. No erythema, tenderness, drainage, or foul odor noted. Drive line anchor in place. Pt denies fever or chills. Driveline dressing is being changed every week per sterile technique using Sorbaview dressing.  Pt may start showers. Shower bag instructions reviewed; advised pt to have caregiver available during shower, hydrate well before shower. Advised caregiver to change dressing if any moisture under Sorbaview window noted.  Also asked her to call VAD coordinator if any change in exit site appearance. Both verbalized understanding of same.    Pt/caregiver deny any alarms or VAD equipment issues. VAD coordinator reviewed daily log from home for daily temperature, weight, and VAD parameters. Pt is performing daily controller and system monitor self tests along with completing weekly and monthly maintenance for LVAD equipment.   LVAD equipment check completed and is in good working order. Back-up equipment present. LVAD education reviewed using  training loop with return demonstration by patient on controller change out. Emergency procedures and precautions reviewed.

## 2014-02-18 NOTE — Progress Notes (Signed)
Patient ID: [name], male   DOB: 02/13/1941, 73 y.o.   MRN: [id number] [facility] CLINIC NOTE  Subjective:  [name] is a 73 yo male with h/o VT, PAD and severe systolic HF (EF 08-67%) due to mixed ischemic/nonischemic CM he is s/p HM IILVAD implant for destination therapy on October 19, 2013. His course was complicated by VT, syncope, A fib/Aflutter and persistent low output felt to be due to RHF. RHC showed low cardiac output but no evidence of RV failure on Milrinone. He was felt to be volume depleted. He was discharged home on 11/18/13 on mirlinone and PRN lasix 20 mg for a weight of 200 pounds or greater. Milrinone has since been weaned off.   He returns for follow up. Overall he is feeling good. Denies SOB/PND/Orthopnea. SOB with inclines. Nausea /Vomiting restarted 2 weeks ago. Weight at home 184-188 pounds. Taking lasix as needed. He had one dose of lasix for lower extremity edema. Has chronic lower extremity edema. Can ambulate 2-3 miles per day. Working 5 hours a day 5 days a week. Compliant with medications. Started playing golf again.   MAP 92  Hgb 11.0 > 10.6 > 10.8 > 10.6 > 11.6>12.2 Creatinine 1.18> 1.47  Denies LVAD alarms.  Denies driveline trauma, erythema or drainage.  Denies ICD shocks.    Reports taking Coumadin as prescribed and adherence to anticoagulation based dietary restrictions.  Denies bright red blood per rectum or melena, no dark urine or hematuria.     Past Medical History  Diagnosis Date  . CAD (coronary artery disease)     s/p MI 1982;  s/p DES to CFX 2011;  s/p NSTEMI 4/12 in setting of VTach;  cath 7/12:  LM ok, LAD mid 30-40%, Dx's with 40%, mCFX stent patent, prox to mid RCA occluded with R to R and L to R collats.  Medical Tx was continued  . Bradycardia   . Ventricular tachycardia     s/p AICD;   h/o ICD shock;  Amiodarone Rx. S/P ablation in 2012  . PVD (peripheral vascular disease)     h/o claudication;  s/p R fem to pop BPG 3/11;   s/p L CFA BPG 7/03;  s/p repair of inf AAA 6/03;  s/p aorta to bilat renal BPG 6/03  . HTN (hypertension)   . HLD (hyperlipidemia)   . Cytopenia   . Hypothyroidism   . Renal insufficiency   . Claudication   . Chronic systolic heart failure   . Ischemic cardiomyopathy     echo 7/12:  EF 25-30%, mild AI, mod MR, mod LAE  . TIA (transient ischemic attack) 2001  . CVD (cerebrovascular disease)     left CEA 2008  . Stroke   . Anemia   . Inappropriate therapy from implantable cardioverter-defibrillator 04/02/2013    ATP for sinus tach  SVT wavelet identified SVT but was no  passive   . Myocardial infarction 1992  . Anginal pain   . Automatic implantable cardioverter-defibrillator in situ     Medtronic Evera device serial number [id number]    . Sleep apnea     denies wearing mask (05/25/2013)  . Arthritis     "a little in my knees" (05/25/2013)  . Melanoma of ear 2013    "near left ear" (05/25/2013)    Current Outpatient Prescriptions  Medication Sig Dispense Refill  . amiodarone (PACERONE) 200 MG tablet Take 1 tablet (200 mg total) by mouth 2 (two) times daily.  60 tablet  6  . amLODipine (NORVASC) 10 MG tablet Take one tab daily  60 tablet  6  . aspirin 81 MG tablet Take 81 mg by mouth daily.       [name] atorvastatin (LIPITOR) 80 MG tablet Take 80 mg by mouth daily.       [name] CALCIUM-MAGNESIUM-ZINC PO Take 3 tablets by mouth at bedtime. 1000 mg calcium, 400 mg magnesium, 25 mg zinc      . Digoxin 62.5 MCG TABS Take 0.0625 mg by mouth daily.  30 tablet  6  . Docusate Sodium (DSS) 100 MG CAPS Take 200 mg by mouth daily as needed.  60 each  6  . ferrous gluconate (FERGON) 324 MG tablet Take 1 tablet (324 mg total) by mouth 3 (three) times daily with meals.  90 tablet  3  . furosemide (LASIX) 20 MG tablet Use 1 x week prn for weight gain  30 tablet  3  . levothyroxine (SYNTHROID) 112 MCG tablet Take 1 tablet (112 mcg total) by mouth daily.      . Multiple Vitamin (MULTIVITAMIN) tablet Take 1  tablet by mouth daily.       . Multiple Vitamins-Minerals (EYE VITAMINS PO) Take 1 capsule by mouth 2 (two) times daily.       . pantoprazole (PROTONIX) 40 MG tablet Take 1 tablet (40 mg total) by mouth daily.  30 tablet  6  . ranolazine (RANEXA) 500 MG 12 hr tablet Take 1 tablet (500 mg total) by mouth 2 (two) times daily.  60 tablet  6  . nitroGLYCERIN (NITROSTAT) 0.4 MG SL tablet Place 0.4 mg under the tongue every 5 (five) minutes as needed for chest pain. For chest pain      . warfarin (COUMADIN) 2 MG tablet As directed for INR goal 2 - 2.5  45 tablet  6   No current facility-administered medications for this encounter.    Demerol; Meperidine hcl; and Opium  REVIEW OF SYSTEMS: All systems negative except as listed in HPI, PMH and Problem list.  LVAD INTERROGATION:   HeartMate II LVAD:   Flow 4.5 liters/min Speed 9000 Power 5.1 PI 7.3 Alarms: none Events: rare PI Fixed speed: 9000 Low speed limit:  8400  I reviewed the LVAD parameters from today, and compared the results to the patient's prior recorded data.  No programming changes were made.  The LVAD is functioning within specified parameters.  The patient performs LVAD self-test daily.  LVAD interrogation was negative for any significant power changes, alarms or PI events/speed drops.  LVAD equipment check completed and is in good working order.  Back-up equipment present.   LVAD education done on emergency procedures and precautions and reviewed exit site care.    Filed Vitals:   02/18/14 0924  BP: 92/0  Pulse: 76  Height: 6' (1.829 m)  Weight: 194 lb 3.2 oz (88.089 kg)  SpO2: 99%  Last weight:  191lbs    Physical Exam: GENERAL: Well appearing, male who presents to clinic today in no acute distress. HEENT: normal  NECK: Supple, JVP 8-9 .  2+ bilaterally, no bruits.  No lymphadenopathy or thyromegaly appreciated.   CARDIAC:  Mechanical heart sounds with LVAD hum present.  LUNGS:  Clear to auscultation bilaterally.   ABDOMEN:  Soft, round, nontender, positive bowel sounds x4.     LVAD exit site: well-healed and incorporated.  Dressing dry and intact.  No erythema or drainage.  Stabilization device present and accurately applied.  Driveline dressing is being  changed daily per sterile technique. EXTREMITIES:  Warm and dry, no cyanosis, clubbing, rash 2+ edema on RLE 1+ on LLE NEUROLOGIC:  Alert and oriented x 4.  Gait steady.  No aphasia.  No dysarthria.  Affect pleasant.     ASSESSMENT AND PLAN:   1. Chronic systolic HF status post LVAD implantation - he is doing very well with NYHA II symptoms.  Off milrinone, 9000 RPM with no PI events noted. Off Lasix. Volume status mildly elevated . Take lasix 20 mg Mon and Friday. Add compression stockings.    2.  Anticoagulation management - INR goal 2.0-2.5.  INR 2.39 today, coumadin dose addressed.  3.  HTN - Continue norvasc to 10 mg last clinic visit; MAP 94. Continue current regimen.   4. AF - maintaining SR on amio and Ranexa per Dr. Caryl Comes. Cut back amiodaorne to 200 mg daily . Follow up iwht Dr Caryl Comes.  5. Nausea- continue protonix    VAD parameters reviewed personally and are stable.   Check BMEt in on week. Follow up 4 weeks   Amy D Clegg NP-C  9:58 AM  Patient seen and examined with Darrick Grinder, NP. We discussed all aspects of the encounter. I agree with the assessment and plan as stated above.   Doing very well with VAD support. Volume status mildly elevated. Agree with diuretics as above. Agree with decreasing amio 200 daily. VAD interrogation looks good.   Shaune Pascal Bensimhon,MD 4:21 PM

## 2014-02-23 ENCOUNTER — Ambulatory Visit (HOSPITAL_COMMUNITY): Payer: Self-pay | Admitting: *Deleted

## 2014-02-23 ENCOUNTER — Encounter: Payer: Self-pay | Admitting: Anesthesiology

## 2014-02-23 ENCOUNTER — Other Ambulatory Visit (HOSPITAL_COMMUNITY): Payer: Self-pay | Admitting: Internal Medicine

## 2014-02-23 LAB — PROTIME-INR: INR: 2.8 — AB (ref <=1.1)

## 2014-02-24 ENCOUNTER — Other Ambulatory Visit (HOSPITAL_COMMUNITY): Payer: Self-pay | Admitting: *Deleted

## 2014-02-24 DIAGNOSIS — Z95811 Presence of heart assist device: Secondary | ICD-10-CM

## 2014-02-24 DIAGNOSIS — Z7901 Long term (current) use of anticoagulants: Secondary | ICD-10-CM

## 2014-02-24 MED ORDER — WARFARIN SODIUM 2 MG PO TABS: ORAL_TABLET | ORAL | Status: DC

## 2014-03-09 ENCOUNTER — Ambulatory Visit (HOSPITAL_COMMUNITY): Payer: Self-pay | Admitting: *Deleted

## 2014-03-09 ENCOUNTER — Other Ambulatory Visit (HOSPITAL_COMMUNITY): Payer: Self-pay | Admitting: Internal Medicine

## 2014-03-09 LAB — PROTIME-INR: INR: 2.2 — AB (ref <=1.1)

## 2014-03-15 LAB — BASIC METABOLIC PANEL
BUN / CREAT RATIO: 13 (ref 10–22)
BUN: 19 mg/dL (ref 8–27)
CHLORIDE: 100 mmol/L (ref 97–108)
CO2: 27 mmol/L (ref 18–29)
Calcium: 9.1 mg/dL (ref 8.6–10.2)
Creatinine, Ser: 1.46 mg/dL — ABNORMAL HIGH (ref 0.76–1.27)
GFR calc Af Amer: 54 mL/min/{1.73_m2} — ABNORMAL LOW (ref >59)
GFR, EST NON AFRICAN AMERICAN: 47 mL/min/{1.73_m2} — AB (ref >59)
Glucose: 92 mg/dL (ref 65–99)
POTASSIUM: 4.7 mmol/L (ref 3.5–5.2)
Sodium: 139 mmol/L (ref 134–144)

## 2014-03-15 LAB — PROTIME-INR
INR: 2.8 — ABNORMAL HIGH (ref 0.8–1.2)
Prothrombin Time: 28.9 s — ABNORMAL HIGH (ref 9.1–12.0)

## 2014-03-15 LAB — AMBIG ABBREV BMP8 DEFAULT

## 2014-03-18 ENCOUNTER — Ambulatory Visit (HOSPITAL_COMMUNITY): Payer: Self-pay | Admitting: Anesthesiology

## 2014-03-18 LAB — PROTIME-INR
INR: 2.2 — AB (ref 0.8–1.2)
Prothrombin Time: 23 s — ABNORMAL HIGH (ref 9.1–12.0)

## 2014-03-18 NOTE — Patient Instructions (Signed)
Called patient and he is aware to continue coumadin dosing and recheck INR in 2 weeks

## 2014-03-23 ENCOUNTER — Other Ambulatory Visit (HOSPITAL_COMMUNITY): Payer: Self-pay | Admitting: *Deleted

## 2014-03-23 DIAGNOSIS — Z95811 Presence of heart assist device: Secondary | ICD-10-CM

## 2014-03-23 DIAGNOSIS — Z7901 Long term (current) use of anticoagulants: Secondary | ICD-10-CM

## 2014-03-26 ENCOUNTER — Ambulatory Visit (HOSPITAL_COMMUNITY)
Admission: RE | Admit: 2014-03-26 | Discharge: 2014-03-26 | Disposition: A | Discharge status: Home / Self Care | Payer: Medicare Other | Source: Ambulatory Visit | Attending: Internal Medicine | Admitting: Internal Medicine

## 2014-03-26 ENCOUNTER — Ambulatory Visit (HOSPITAL_COMMUNITY): Payer: Self-pay | Admitting: *Deleted

## 2014-03-26 ENCOUNTER — Encounter: Payer: Self-pay | Admitting: Vascular Surgery

## 2014-03-26 VITALS — BP 112/0 | HR 75 | Ht 72.0 in | Wt 199.4 lb

## 2014-03-26 DIAGNOSIS — N183 Chronic kidney disease, stage 3 unspecified: Secondary | ICD-10-CM

## 2014-03-26 DIAGNOSIS — Z7901 Long term (current) use of anticoagulants: Secondary | ICD-10-CM | POA: Diagnosis not present

## 2014-03-26 DIAGNOSIS — I50810 Right heart failure, unspecified: Secondary | ICD-10-CM

## 2014-03-26 DIAGNOSIS — Z95811 Presence of heart assist device: Secondary | ICD-10-CM | POA: Insufficient documentation

## 2014-03-26 DIAGNOSIS — I4891 Unspecified atrial fibrillation: Secondary | ICD-10-CM | POA: Diagnosis not present

## 2014-03-26 DIAGNOSIS — I509 Heart failure, unspecified: Secondary | ICD-10-CM | POA: Diagnosis not present

## 2014-03-26 DIAGNOSIS — I471 Supraventricular tachycardia: Secondary | ICD-10-CM

## 2014-03-26 DIAGNOSIS — I10 Essential (primary) hypertension: Secondary | ICD-10-CM

## 2014-03-26 DIAGNOSIS — I5022 Chronic systolic (congestive) heart failure: Secondary | ICD-10-CM | POA: Diagnosis not present

## 2014-03-26 LAB — CBC
HCT: 35.9 % — ABNORMAL LOW (ref 39.0–52.0)
HEMOGLOBIN: 11.6 g/dL — AB (ref 13.0–17.0)
MCH: 30.2 pg (ref 26.0–34.0)
MCHC: 32.3 g/dL (ref 30.0–36.0)
MCV: 93.5 fL (ref 78.0–100.0)
PLATELETS: 184 10*3/uL (ref 150–400)
RBC: 3.84 MIL/uL — ABNORMAL LOW (ref 4.22–5.81)
RDW: 17.5 % — ABNORMAL HIGH (ref 11.5–15.5)
WBC: 5.3 10*3/uL (ref 4.0–10.5)

## 2014-03-26 LAB — PROTIME-INR
INR: 2.08 — ABNORMAL HIGH (ref 0.00–1.49)
Prothrombin Time: 22.7 seconds — ABNORMAL HIGH (ref 11.6–15.2)

## 2014-03-26 LAB — BASIC METABOLIC PANEL
BUN: 26 mg/dL — ABNORMAL HIGH (ref 6–23)
CHLORIDE: 103 meq/L (ref 96–112)
CO2: 27 meq/L (ref 19–32)
CREATININE: 1.57 mg/dL — AB (ref 0.50–1.35)
Calcium: 9.2 mg/dL (ref 8.4–10.5)
GFR calc Af Amer: 48 mL/min — ABNORMAL LOW (ref >90)
GFR calc non Af Amer: 42 mL/min — ABNORMAL LOW (ref >90)
GLUCOSE: 101 mg/dL — AB (ref 70–99)
Potassium: 4.2 mEq/L (ref 3.7–5.3)
Sodium: 141 mEq/L (ref 137–147)

## 2014-03-26 LAB — LACTATE DEHYDROGENASE: LDH: 327 U/L — ABNORMAL HIGH (ref 94–250)

## 2014-03-26 LAB — PRO B NATRIURETIC PEPTIDE: PRO B NATRI PEPTIDE: 2126 pg/mL — AB (ref 0–125)

## 2014-03-26 MED ORDER — RAMIPRIL 2.5 MG PO CAPS: 2.5000 mg | ORAL_CAPSULE | Freq: Two times a day (BID) | ORAL | Status: DC

## 2014-03-26 NOTE — Patient Instructions (Signed)
1.  Stop Digoxin 2.  Start Ramipril 2.5 mg twice daily 3.  Increase Lasix to 20 mg MWF 4.  No change in coumadin dose. Re-check INR and BMP 04/01/14. 5.  Ramp echo next visit. 6.  Return to [place] clinic in 6 weeks.

## 2014-03-26 NOTE — Progress Notes (Signed)
Patient ID: [name], male   DOB: 1941/04/13, 73 y.o.   MRN: [id number]  PCP: Dr. Karolee Stamps ([place])  Subjective:  [name] is a 73 yo male with h/o VT, PAD and severe systolic HF (EF 53-97%) due to mixed ischemic/nonischemic CM he is s/p HM IILVAD implant for destination therapy on October 19, 2013. His course was complicated by VT, syncope, A fib/Aflutter and persistent low output felt to be due to RHF. RHC showed low cardiac output but no evidence of RV failure on Milrinone. He was felt to be volume depleted. He was discharged home on 11/18/13 on mirlinone and PRN lasix 20 mg for a weight of 200 pounds or greater. Milrinone has since been weaned off.   Follow up:  Last visit decreased amiodarone to 200 mg daily. Doing well. Working PT and is walking about 2 miles a day. Denies orthopnea, CP or edema. + SOB with inclines but at baseline. Weight at home 195-199 lbs. Taking lasix on M and Fridays. Compliant with medications. Not following a low salt diet. Drinking around 2L a day. Playing golf.   Denies LVAD alarms.  Denies driveline trauma, erythema or drainage.  Denies ICD shocks.    Reports taking Coumadin as prescribed and adherence to anticoagulation based dietary restrictions.  Denies bright red blood per rectum or melena, no dark urine or hematuria.     Past Medical History  Diagnosis Date  . CAD (coronary artery disease)     s/p MI 1982;  s/p DES to CFX 2011;  s/p NSTEMI 4/12 in setting of VTach;  cath 7/12:  LM ok, LAD mid 30-40%, Dx's with 40%, mCFX stent patent, prox to mid RCA occluded with R to R and L to R collats.  Medical Tx was continued  . Bradycardia   . Ventricular tachycardia     s/p AICD;   h/o ICD shock;  Amiodarone Rx. S/P ablation in 2012  . 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 HTN (hypertension)   . HLD  (hyperlipidemia)   . Cytopenia   . Hypothyroidism   . Renal insufficiency   . Claudication   . Chronic systolic heart failure   . Ischemic cardiomyopathy     echo 7/12:  EF 25-30%, mild AI, mod MR, mod LAE  . TIA (transient ischemic attack) 2001  . CVD (cerebrovascular disease)     left CEA 2008  . Stroke   . Anemia   . Inappropriate therapy from implantable cardioverter-defibrillator 04/02/2013    ATP for sinus tach  SVT wavelet identified SVT but was no  passive   . Myocardial infarction 1992  . Anginal pain   . Automatic implantable cardioverter-defibrillator in situ     Medtronic Evera device serial number [id number]    . Sleep apnea     denies wearing mask (05/25/2013)  . Arthritis     "a little in my knees" (05/25/2013)  . Melanoma of ear 2013    "near left ear" (05/25/2013)    Current Outpatient Prescriptions  Medication Sig Dispense Refill  . amiodarone (PACERONE) 200 MG tablet Take 1 tablet (200 mg total) by mouth daily.  30 tablet  6  . amLODipine (NORVASC) 10 MG tablet Take one tab daily  60 tablet  6  . aspirin 81  MG tablet Take 81 mg by mouth daily.       [name] atorvastatin (LIPITOR) 80 MG tablet Take 80 mg by mouth daily.       [name] CALCIUM-MAGNESIUM-ZINC PO Take 3 tablets by mouth at bedtime. 1000 mg calcium, 400 mg magnesium, 25 mg zinc      . Digoxin 62.5 MCG TABS Take 0.0625 mg by mouth daily.  30 tablet  6  . Docusate Sodium (DSS) 100 MG CAPS Take 200 mg by mouth daily as needed.  60 each  6  . ferrous gluconate (FERGON) 324 MG tablet Take 1 tablet (324 mg total) by mouth 3 (three) times daily with meals.  90 tablet  3  . furosemide (LASIX) 20 MG tablet Take 1 tablet (20 mg total) by mouth 2 (two) times a week. Every Monday and Friday  30 tablet  6  . levothyroxine (SYNTHROID) 112 MCG tablet Take 1 tablet (112 mcg total) by mouth daily.      . Multiple Vitamin (MULTIVITAMIN) tablet Take 1 tablet by mouth daily.       . Multiple Vitamins-Minerals (EYE VITAMINS PO) Take  1 capsule by mouth 2 (two) times daily.       . pantoprazole (PROTONIX) 40 MG tablet Take 1 tablet (40 mg total) by mouth daily.  30 tablet  6  . ranolazine (RANEXA) 500 MG 12 hr tablet Take 1 tablet (500 mg total) by mouth 2 (two) times daily.  60 tablet  6  . warfarin (COUMADIN) 2 MG tablet As directed for INR goal 2 - 2.5  60 tablet  12  . nitroGLYCERIN (NITROSTAT) 0.4 MG SL tablet Place 0.4 mg under the tongue every 5 (five) minutes as needed for chest pain. For chest pain       No current facility-administered medications for this encounter.    Demerol; Meperidine hcl; and Opium  REVIEW OF SYSTEMS: All systems negative except as listed in HPI, PMH and Problem list.  LVAD INTERROGATION:   HeartMate II LVAD:   Flow 4.6 liters/min Speed 9000 Power 4.9 PI 8.0 Alarms: none Events: rare PI event on most days and then on days he has the nausea and gagging has 10 Fixed speed: 9000 Low speed limit:  8400  I reviewed the LVAD parameters from today, and compared the results to the patient's prior recorded data.  No programming changes were made.  The LVAD is functioning within specified parameters.  The patient performs LVAD self-test daily.  LVAD interrogation was negative for any significant power changes, alarms or PI events/speed drops.  LVAD equipment check completed and is in good working order.  Back-up equipment present.   LVAD education done on emergency procedures and precautions and reviewed exit site care.    Filed Vitals:   03/26/14 0834  BP: 112/0  Pulse: 75  Height: 6' (1.829 m)  Weight: 199 lb 6.4 oz (90.447 kg)  SpO2: 98%    Physical Exam: GENERAL: Well appearing, male who presents to clinic today in no acute distress. Wife present HEENT: normal  NECK: Supple, JVP 8-9 .  2+ bilaterally, no bruits.  No lymphadenopathy or thyromegaly appreciated.   CARDIAC:  Mechanical heart sounds with LVAD hum present.  LUNGS:  Clear to auscultation bilaterally.  ABDOMEN:  Soft,  round, nontender, positive bowel sounds x4.     LVAD exit site: well-healed and incorporated.  Dressing dry and intact.  No erythema or drainage.  Stabilization device present and accurately applied.  Driveline dressing is  being changed daily per sterile technique. EXTREMITIES:  Warm and dry, no cyanosis, clubbing, bilateral 2+ edema NEUROLOGIC:  Alert and oriented x 4.  Gait steady.  No aphasia.  No dysarthria.  Affect pleasant.     ASSESSMENT AND PLAN:   1. Chronic systolic HF: s/p LVAD 53/9767 - NYHA II symptoms and volume status elevated. Will increase lasix to 20 mg on Mondays, Wednesdays and Fridays. Check BMET in 1-2 weeks. - no BB with hx of RV failure - Has suffered with orthostatic hypotension in the past and was on Midodrine which has been weaned off and now is having high MAPs. Will start ramipril 2.5 mg BID and instructed to call if any dizziness. - Optivol interrogated: Optivol doesn't correlate with LVAD it appears, but activity has increased to about 3-4 hrs a day and no afib.  - Continue TED hose. - Stop digoxin - Reinforced the need and importance of daily weights, a low sodium diet, and fluid restriction (less than 2 L a day). Instructed to call the HF clinic if weight increases more than 3 lbs overnight or 5 lbs in a week.  2) HTN - MAP 112. As above will restart ACE-I. Continue norvasc 10 mg daily.  3) LVAD - LVAD parameters stable. PI elevated likely related to volume on board, as above increase lasix.  - Few PI events. Days that he has more correlates with the days he is nauseous and gagging. - Check BMET, LDH, INR, CBC and pro-BNP - Will need ramp ECHO next visit to assess RV and whether we could increase speed.  4) CKD, stage III - Creatinine has been trending up since discontinuation of milrinone. BUN has been stable. Cr could also be increasing d/t MAPs being elevated and hoping with more controlled MAP that will get better flow. Will continue to follow but new  baseline may be closer to 1.4-1.6 5) Anticoagulation management: - Check INR today and will address accordingly. 6) AFib/Flutter - Optivol interrogated and appears to not be having any fib/flutter. Will continue amiodarone 200 mg daily and may be able to wean off next visit. Follow up with Dr. Caryl Comes.   F/U 6 weeks with RAMP ECHO Rande Brunt 1:33 PM

## 2014-03-26 NOTE — Progress Notes (Signed)
Symptom  Yes  No  Details   Angina        x Activity:   Claudication        x How far:   Syncope        x When:     Stroke        x        TIA  2001  Orthopnea         x How many pillows: 1  PND         x How often:  CPAP       N/A How many hrs:   Pedal edema        x  occasional; resolves with Lasix (pt losing @ 4 lbs after each M/F lasix dose)  Abd fullness               x    N&V        x         good appetite; nausea and gagging 1 - 2 x week  Diaphoresis         x When:   Bleeding              x   Urine color        yellow  SOB       x      Activity:  Incline  Palpitations        x When:  ICD shock        x   Hospitlizaitons             x When/where/why:   ED visit        x When/where/why:  Other MD              x When/who/why:   Activity        Working 5 hrs/5 days week; walking 2 miles day; playing 9 holes golf 1-2 x week  Fluid    No limitations  < 2000   Diet    Low salt food   Vital signs: HR: 75 MAP BP:  112 O2 Sat:  98 Wt:  199.4  lbs Last wt: 194.2  lbs Ht: 6'  LVAD interrogation reveals:  Speed:   9000 RPM Flow:  4.7 Power:   5.0 PI:  7.8 Alarms:  none Events:  Rare PI except on mornings of nausea/gagging Fixed speed:  9000 Low speed limit:  8400  LVAD exit site: [name]  VAD dressing removed and site care performed using sterile technique. Drive line exit site cleaned with Chlora prep applicators x 2, allowed to dry, and Sorbvaview dressing with Biopatch re-applied. Drive line incorporated, the velour is fully implanted at exit site. No erythema, tenderness, drainage, or foul odor noted. Drive line anchor in place. Pt denies fever or chills. Driveline dressing is being changed every week per sterile technique using Sorbaview dressing.  Pt has started showers without any reported difficulties.   Pt/caregiver deny any alarms or VAD equipment issues. VAD coordinator reviewed daily log from home for daily temperature, weight, and VAD parameters. Pt is performing daily  controller and system monitor self tests along with completing weekly and monthly maintenance for LVAD equipment.   LVAD equipment check completed and is in good working order. Back-up equipment present. LVAD education reviewed using training loop with return demonstration by patient on controller change out. Emergency procedures and precautions reviewed.  Pt planning trip in near future; will contact nearest VAD  center for 24/7 emergency contact info and give to [name]. Reviewed traveling with VAD equipment using AC adaptor in car. Pt and [name] verbalized understanding of same.

## 2014-03-30 ENCOUNTER — Ambulatory Visit (HOSPITAL_COMMUNITY)
Admission: RE | Admit: 2014-03-30 | Discharge: 2014-03-30 | Disposition: A | Discharge status: Home / Self Care | Payer: Medicare Other | Source: Ambulatory Visit | Attending: Vascular Surgery | Admitting: Vascular Surgery

## 2014-03-30 ENCOUNTER — Ambulatory Visit (INDEPENDENT_AMBULATORY_CARE_PROVIDER_SITE_OTHER)
Admission: RE | Admit: 2014-03-30 | Discharge: 2014-03-30 | Disposition: A | Discharge status: Home / Self Care | Payer: Medicare Other | Source: Ambulatory Visit | Attending: Vascular Surgery | Admitting: Vascular Surgery

## 2014-03-30 ENCOUNTER — Telehealth (HOSPITAL_COMMUNITY): Payer: Self-pay | Admitting: *Deleted

## 2014-03-30 ENCOUNTER — Encounter: Payer: Self-pay | Admitting: Internal Medicine

## 2014-03-30 ENCOUNTER — Other Ambulatory Visit: Payer: Self-pay | Admitting: Vascular Surgery

## 2014-03-30 ENCOUNTER — Encounter: Payer: Self-pay | Admitting: Vascular Surgery

## 2014-03-30 ENCOUNTER — Ambulatory Visit (INDEPENDENT_AMBULATORY_CARE_PROVIDER_SITE_OTHER): Payer: Medicare Other | Admitting: Internal Medicine

## 2014-03-30 ENCOUNTER — Ambulatory Visit (INDEPENDENT_AMBULATORY_CARE_PROVIDER_SITE_OTHER): Payer: Medicare Other | Admitting: Vascular Surgery

## 2014-03-30 VITALS — BP 76/0 | HR 75 | Ht 72.0 in | Wt 199.0 lb

## 2014-03-30 VITALS — HR 73 | Temp 97.5°F | Ht 72.0 in | Wt 200.1 lb

## 2014-03-30 DIAGNOSIS — I739 Peripheral vascular disease, unspecified: Secondary | ICD-10-CM

## 2014-03-30 DIAGNOSIS — I6529 Occlusion and stenosis of unspecified carotid artery: Secondary | ICD-10-CM

## 2014-03-30 DIAGNOSIS — Z48812 Encounter for surgical aftercare following surgery on the circulatory system: Secondary | ICD-10-CM

## 2014-03-30 DIAGNOSIS — T82198A Other mechanical complication of other cardiac electronic device, initial encounter: Secondary | ICD-10-CM

## 2014-03-30 DIAGNOSIS — I4892 Unspecified atrial flutter: Secondary | ICD-10-CM

## 2014-03-30 DIAGNOSIS — I472 Ventricular tachycardia, unspecified: Secondary | ICD-10-CM

## 2014-03-30 DIAGNOSIS — I5022 Chronic systolic (congestive) heart failure: Secondary | ICD-10-CM

## 2014-03-30 DIAGNOSIS — I4729 Other ventricular tachycardia: Secondary | ICD-10-CM

## 2014-03-30 DIAGNOSIS — Z95810 Presence of automatic (implantable) cardiac defibrillator: Secondary | ICD-10-CM

## 2014-03-30 DIAGNOSIS — I2589 Other forms of chronic ischemic heart disease: Secondary | ICD-10-CM

## 2014-03-30 DIAGNOSIS — I4891 Unspecified atrial fibrillation: Secondary | ICD-10-CM

## 2014-03-30 DIAGNOSIS — I255 Ischemic cardiomyopathy: Secondary | ICD-10-CM

## 2014-03-30 LAB — MDC_IDC_ENUM_SESS_TYPE_INCLINIC
Battery Remaining Longevity: 104 mo
Battery Voltage: 3 V
Brady Statistic AP VP Percent: 0.17 %
Brady Statistic AS VP Percent: 0.08 %
Brady Statistic AS VS Percent: 3.42 %
Brady Statistic RV Percent Paced: 0.25 %
Date Time Interrogation Session: 20150526122159
HIGH POWER IMPEDANCE MEASURED VALUE: 32 Ohm
HighPow Impedance: 190 Ohm
HighPow Impedance: 38 Ohm
Lead Channel Pacing Threshold Amplitude: 1 V
Lead Channel Pacing Threshold Pulse Width: 0.4 ms
Lead Channel Pacing Threshold Pulse Width: 0.4 ms
Lead Channel Sensing Intrinsic Amplitude: 1.125 mV
Lead Channel Sensing Intrinsic Amplitude: 2 mV
Lead Channel Sensing Intrinsic Amplitude: 2 mV
Lead Channel Setting Pacing Amplitude: 2.25 V
Lead Channel Setting Sensing Sensitivity: 0.3 mV
MDC IDC MSMT LEADCHNL RA IMPEDANCE VALUE: 361 Ohm
MDC IDC MSMT LEADCHNL RA SENSING INTR AMPL: 2.375 mV
MDC IDC MSMT LEADCHNL RV IMPEDANCE VALUE: 589 Ohm
MDC IDC MSMT LEADCHNL RV PACING THRESHOLD AMPLITUDE: 0.875 V
MDC IDC SET LEADCHNL RV PACING AMPLITUDE: 2 V
MDC IDC SET LEADCHNL RV PACING PULSEWIDTH: 0.4 ms
MDC IDC SET ZONE DETECTION INTERVAL: 300 ms
MDC IDC SET ZONE DETECTION INTERVAL: 430 ms
MDC IDC STAT BRADY AP VS PERCENT: 96.33 %
MDC IDC STAT BRADY RA PERCENT PACED: 96.5 %
Zone Setting Detection Interval: 330 ms
Zone Setting Detection Interval: 370 ms
Zone Setting Detection Interval: 540 ms

## 2014-03-30 LAB — TSH: TSH: 16.72 u[IU]/mL — AB (ref 0.35–4.50)

## 2014-03-30 NOTE — Progress Notes (Signed)
Patient Care Team: [name], MD as PCP - General (Family Medicine)   HPI  [name] is a 73 y.o. male Seen in followup for ventricular tachycardia occurring in the context of ischemic heart disease prior revascularization MI. He has had significant problems with ICD discharge and underwent catheter ablation November 2012. He underwent device revision with insertion of an atrial lead and generator replacement 7/14  He underwent repeat ablation August 2014 He was rendered noninducible at this procedure also. He has had recurrent ventricular tachycardia.  Catheterization was recently demonstrated two-vessel coronary disease with patent stents in the chronically totally occluded RCA ejection fraction of 25-30% Myoview scan demonstrated a large inferolateral infarct.  Echocardiogram 8/14 demonstrated EF of 15-20%   He underwent VAD insertion  He is seen now because of loss of R wave  Denies   Past Medical History  Diagnosis Date  . CAD (coronary artery disease)     s/p MI 1982;  s/p DES to CFX 2011;  s/p NSTEMI 4/12 in setting of VTach;  cath 7/12:  LM ok, LAD mid 30-40%, Dx's with 40%, mCFX stent patent, prox to mid RCA occluded with R to R and L to R collats.  Medical Tx was continued  . Bradycardia   . Ventricular tachycardia     s/p AICD;   h/o ICD shock;  Amiodarone Rx. S/P ablation in 2012  . 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 HTN (hypertension)   . HLD (hyperlipidemia)   . Cytopenia   . Hypothyroidism   . Renal insufficiency   . Claudication   . Chronic systolic heart failure   . Ischemic cardiomyopathy     echo 7/12:  EF 25-30%, mild AI, mod MR, mod LAE  . TIA (transient ischemic attack) 2001  . CVD (cerebrovascular disease)     left CEA 2008  . Stroke   . Anemia   . Inappropriate therapy from implantable  cardioverter-defibrillator 04/02/2013    ATP for sinus tach  SVT wavelet identified SVT but was no  passive   . Myocardial infarction 1992  . Anginal pain   . Automatic implantable cardioverter-defibrillator in situ     Medtronic Evera device serial number [id number]    . Sleep apnea     denies wearing mask (05/25/2013)  . Arthritis     "a little in my knees" (05/25/2013)  . Melanoma of ear 2013    "near left ear" (05/25/2013)    Past Surgical History  Procedure Laterality Date  . Femoral-popliteal bypass graft Right 2011  . Cardiac defibrillator placement  2003; 2005; 05/25/2013    2014: Medtronic Evera device serial number [id number]  . Revascularization / in-situ graft leg    . Renal artery bypass Bilateral 2003  . Back surgery    . Lumbar disc surgery  1974; 2000's  . Knee arthroscopy Bilateral 1990's  . Elbow arthroscopy Right 1990's  . Cholecystectomy  12/15/?2010  . Lipoma excision Left 2013    "near ear" (05/25/2013)  . Heel spur surgery Right 1990's  . Cataract extraction w/ intraocular lens  implant, bilateral Bilateral 2000  . Carotid endarterectomy Left ~ 2007  . Doppler echocardiography  2011  . Tonsillectomy  1946  . Femoral-popliteal bypass graft Left 05/2002    /  notes 05/06/2002 (05/25/2013)  . Tumor excision Left 1960's    "fatty tumor" (05/25/2013)  . Cardiac electrophysiology study and ablation  2012  . Coronary angioplasty  1992  . Cardiac catheterization      "several" (05/25/2013)  . Coronary angioplasty with stent placement      "last one was 11/2012  (05/25/2013)  . Cardioversion N/A 10/15/2013    Procedure: CARDIOVERSION;  Surgeon: Jolaine Artist, MD;  Location: [facility];  Service: Cardiovascular;  Laterality: N/A;  . Insertion of implantable left ventricular assist device N/A 10/19/2013    Procedure: INSERTION OF IMPLANTABLE LEFT VENTRICULAR ASSIST DEVICE;  Surgeon: Gaye Pollack, MD;  Location: [facility];  Service: Open Heart Surgery;  Laterality: N/A;  CIRC  ARREST  NITRIC OXIDE  MEDTRONIC ICD  . Intraoperative transesophageal echocardiogram N/A 10/19/2013    Procedure: INTRAOPERATIVE TRANSESOPHAGEAL ECHOCARDIOGRAM;  Surgeon: Gaye Pollack, MD;  Location: [facility] OR;  Service: Open Heart Surgery;  Laterality: N/A;  . Tee without cardioversion N/A 11/04/2013    Procedure: TRANSESOPHAGEAL ECHOCARDIOGRAM (TEE);  Surgeon: Larey Dresser, MD;  Location: [facility] ENDOSCOPY;  Service: Cardiovascular;  Laterality: N/A;  . Cardioversion N/A 11/04/2013    Procedure: CARDIOVERSION;  Surgeon: Larey Dresser, MD;  Location: [facility] [facility];  Service: Cardiovascular;  Laterality: N/A;    Current Outpatient Prescriptions  Medication Sig Dispense Refill  . amiodarone (PACERONE) 200 MG tablet Take 1 tablet (200 mg total) by mouth daily.  30 tablet  6  . amLODipine (NORVASC) 10 MG tablet Take one tab daily  60 tablet  6  . aspirin 81 MG tablet Take 81 mg by mouth daily.       [name] atorvastatin (LIPITOR) 80 MG tablet Take 80 mg by mouth daily.       [name] CALCIUM-MAGNESIUM-ZINC PO Take 3 tablets by mouth at bedtime. 1000 mg calcium, 400 mg magnesium, 25 mg zinc      . Docusate Sodium (DSS) 100 MG CAPS Take 200 mg by mouth daily as needed.  60 each  6  . ferrous gluconate (FERGON) 324 MG tablet Take 1 tablet (324 mg total) by mouth 3 (three) times daily with meals.  90 tablet  3  . furosemide (LASIX) 20 MG tablet Take 20 mg by mouth 3 (three) times a week. Every MWF      . levothyroxine (SYNTHROID) 112 MCG tablet Take 1 tablet (112 mcg total) by mouth daily.      . Multiple Vitamin (MULTIVITAMIN) tablet Take 1 tablet by mouth daily.       . Multiple Vitamins-Minerals (EYE VITAMINS PO) Take 1 capsule by mouth 2 (two) times daily.       . nitroGLYCERIN (NITROSTAT) 0.4 MG SL tablet Place 0.4 mg under the tongue every 5 (five) minutes as needed for chest pain. For chest pain      . pantoprazole (PROTONIX) 40 MG tablet Take 1 tablet (40 mg total) by mouth daily.  30 tablet  6  .  ramipril (ALTACE) 2.5 MG capsule Take 1 capsule (2.5 mg total) by mouth 2 (two) times daily.  60 capsule  6  . ranolazine (RANEXA) 500 MG 12 hr tablet Take 1 tablet (500 mg total) by mouth 2 (two) times daily.  60 tablet  6  . warfarin (COUMADIN) 2 MG tablet As directed for INR goal 2 - 2.5  60 tablet  12   No current facility-administered medications for this visit.    Allergies  Allergen Reactions  . Demerol [  Meperidine]     Paralysis. Could only move eyes.  . Meperidine Hcl Other (See Comments)    "CAN'T MOVE" CATATONIC PER PT.  [name] Opium Other (See Comments)    "CAN'T MOVE" CATATONIC PER PT.    Review of Systems negative except from HPI and PMH  Physical Exam BP 76/0  Pulse 75  Ht 6' (1.829 m)  Wt 199 lb (90.266 kg)  BMI 26.98 kg/m2 Well developed and well nourished in no acute distress HENT normal E scleral and icterus clear Neck Supple JVP flat; carotids brisk and full Clear to ausculation  Regular rate and rhythm, LVAD whirring  Soft with active bowel sounds No clubbing cyanosis  Trace Edema Alert and oriented, grossly normal motor and sensory function Skin Warm and Dry    Assessment and  Plan  CHF -chronic systolic   ICD-Medtronic The patient's device was interrogated.  The information was reviewed. No changes were made in the programming.   '  LVAD  ISCHEMIC CARDIOMYOPATHY  TIA/CVA  VT  AFib  Loss of R waves-ICD    The patient is doing remarkably well with his LVAD. There has been no intercurrent ventricular tachycardia or atrial fibrillation. His amiodarone dose has been decreased. We will hold that at its current dose and if no further arrhythmias occur over the next 6 months we'll further decrease it to 1000 mg per week. We'll check his TSH today as part of amiodarone surveillance. No clinical symptoms of amiodarone toxicity  He is on warfarin in conjunction with aspirin the latter added because of his LVAD. There has been no bleeding.   Trace  edema; we will leave medicines as they are.  The loss of R waves are concerning with his ICD. However, given the presence of his LVAD which will support he is hemodynamically in the event of recurrent ventricular arrhythmia I would defer revision of his system so as to avoid the risks of infection. He and his wife understand the dilemma and are agreeable

## 2014-03-30 NOTE — Patient Instructions (Addendum)
Remote monitoring is used to monitor your ICD from home. This monitoring reduces the number of office visits required to check your device to one time per year. It allows [place] to keep an eye on the functioning of your device to ensure it is working properly. You are scheduled for a device check from home on 07-01-2014. You may send your transmission at any time that day. If you have a wireless device, the transmission will be sent automatically. After your physician reviews your transmission, you will receive a postcard with your next transmission date.  Your physician recommends that you schedule a follow-up appointment in: 6 months with Hunter   Your physician recommends that you continue on your current medications as directed. Please refer to the Current Medication list given to you today.  Labs today: TSH

## 2014-03-30 NOTE — Addendum Note (Signed)
Addended by: Mena Goes on: 03/30/2014 03:06 PM   Modules accepted: Orders

## 2014-03-30 NOTE — Telephone Encounter (Signed)
Kathlene November with following contact information:    [facility]  Office: [phone]  24/7 emergency:  [phone] ask for [place]. [facility]  Office: [phone]  24/7 emergency:  [phone] ask for VAD coordinator

## 2014-03-30 NOTE — Progress Notes (Signed)
Subjective:     Patient ID: [name], male   DOB: 05-Jan-1941, 73 y.o.   MRN: [id number]  HPI this 73 year old male returns for followup regarding his carotid occlusive disease and bilateral femoral-popliteal bypass grafts which I performed in the past. Denies any neurologic symptoms such lateralizing weakness, aphasia, amaurosis fugax, diplopia, blurred vision, and syncope. He has no claudication symptoms. He plays golf frequently and still works as a Presenter, broadcasting. He had an LVAD inserted 10/19/2013 for ischemic cardiomyopathy and has done extremely well from that standpoint. Currently is on Coumadin. He denies any chest pain or dyspnea on exertion.  Past Medical History  Diagnosis Date  . CAD (coronary artery disease)     s/p MI 1982;  s/p DES to CFX 2011;  s/p NSTEMI 4/12 in setting of VTach;  cath 7/12:  LM ok, LAD mid 30-40%, Dx's with 40%, mCFX stent patent, prox to mid RCA occluded with R to R and L to R collats.  Medical Tx was continued  . Bradycardia   . Ventricular tachycardia     s/p AICD;   h/o ICD shock;  Amiodarone Rx. S/P ablation in 2012  . 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 HTN (hypertension)   . HLD (hyperlipidemia)   . Cytopenia   . Hypothyroidism   . Renal insufficiency   . Claudication   . Chronic systolic heart failure   . Ischemic cardiomyopathy     echo 7/12:  EF 25-30%, mild AI, mod MR, mod LAE  . TIA (transient ischemic attack) 2001  . CVD (cerebrovascular disease)     left CEA 2008  . Stroke   . Anemia   . Inappropriate therapy from implantable cardioverter-defibrillator 04/02/2013    ATP for sinus tach  SVT wavelet identified SVT but was no  passive   . Myocardial infarction 1992  . Anginal pain   . Automatic implantable cardioverter-defibrillator in situ     Medtronic Evera device serial number [id number]    .  Sleep apnea     denies wearing mask (05/25/2013)  . Arthritis     "a little in my knees" (05/25/2013)  . Melanoma of ear 2013    "near left ear" (05/25/2013)  . 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 CHF (congestive heart failure)     History  Substance Use Topics  . Smoking status: Former Smoker -- 0.50 packs/day for 37 years    Types: Cigarettes    Quit date: 11/05/2001  . Smokeless tobacco: Never Used  . Alcohol Use: 0.0 oz/week     Comment: 05/25/2013 "mixed drink couple times/month"    Family History  Problem Relation Age of Onset  . Coronary artery disease    . Heart attack Mother   . Coronary artery disease Mother   . Cancer Mother   . Heart disease Mother   . Hyperlipidemia Mother   . Hypertension Mother   . Coronary artery disease Father   . Stroke Father   . Cancer Father   . Heart disease Father     before age 26  . Hyperlipidemia Father   . Hypertension Father   . Heart attack Father     Allergies  Allergen Reactions  . Demerol [Meperidine]     Paralysis. Could only  move eyes.  . Meperidine Hcl Other (See Comments)    "CAN'T MOVE" CATATONIC PER PT.  [name] Opium Other (See Comments)    "CAN'T MOVE" CATATONIC PER PT.    Current outpatient prescriptions:amiodarone (PACERONE) 200 MG tablet, Take 1 tablet (200 mg total) by mouth daily., Disp: 30 tablet, Rfl: 6;  amLODipine (NORVASC) 10 MG tablet, Take one tab daily, Disp: 60 tablet, Rfl: 6;  aspirin 81 MG tablet, Take 81 mg by mouth daily. , Disp: , Rfl: ;  atorvastatin (LIPITOR) 80 MG tablet, Take 80 mg by mouth daily. , Disp: , Rfl:  CALCIUM-MAGNESIUM-ZINC PO, Take 3 tablets by mouth at bedtime. 1000 mg calcium, 400 mg magnesium, 25 mg zinc, Disp: , Rfl: ;  Docusate Sodium (DSS) 100 MG CAPS, Take 200 mg by mouth daily as needed., Disp: 60 each, Rfl: 6;  ferrous gluconate (FERGON) 324 MG tablet, Take 1 tablet (324 mg total) by mouth 3 (three) times daily with meals., Disp: 90 tablet, Rfl: 3 furosemide (LASIX) 20 MG  tablet, Take 20 mg by mouth 3 (three) times a week. Every MWF, Disp: , Rfl: ;  levothyroxine (SYNTHROID) 112 MCG tablet, Take 1 tablet (112 mcg total) by mouth daily., Disp: , Rfl: ;  Multiple Vitamin (MULTIVITAMIN) tablet, Take 1 tablet by mouth daily. , Disp: , Rfl: ;  Multiple Vitamins-Minerals (EYE VITAMINS PO), Take 1 capsule by mouth 2 (two) times daily. , Disp: , Rfl:  nitroGLYCERIN (NITROSTAT) 0.4 MG SL tablet, Place 0.4 mg under the tongue every 5 (five) minutes as needed for chest pain. For chest pain, Disp: , Rfl: ;  pantoprazole (PROTONIX) 40 MG tablet, Take 1 tablet (40 mg total) by mouth daily., Disp: 30 tablet, Rfl: 6;  ramipril (ALTACE) 2.5 MG capsule, Take 1 capsule (2.5 mg total) by mouth 2 (two) times daily., Disp: 60 capsule, Rfl: 6 ranolazine (RANEXA) 500 MG 12 hr tablet, Take 1 tablet (500 mg total) by mouth 2 (two) times daily., Disp: 60 tablet, Rfl: 6;  warfarin (COUMADIN) 2 MG tablet, As directed for INR goal 2 - 2.5, Disp: 60 tablet, Rfl: 12  BP   Pulse 73  Temp(Src) 97.5 F (36.4 C) (Oral)  Ht 6' (1.829 m)  Wt 200 lb 1.6 oz (90.765 kg)  BMI 27.13 kg/m2  SpO2 100%  Body mass index is 27.13 kg/(m^2).          Review of Systems  Denies chest pain, dyspnea on exertion, PND, orthopnea, hemoptysis, claudication,-other systems negative and a complete review of systems other than the cardiac history noted in history of present illness      Objective:   Physical Exam BP   Pulse 73  Temp(Src) 97.5 F (36.4 C) (Oral)  Ht 6' (1.829 m)  Wt 200 lb 1.6 oz (90.765 kg)  BMI 27.13 kg/m2  SpO2 100%  Gen.-alert and oriented x3 in no apparent distress HEENT normal for age Lungs no rhonchi or wheezing Cardiovascular regular rhythm no murmurs carotid pulses 3+ palpable no bruits audible Abdomen soft nontender no palpable masses-leads for LVAD on the right lateral upper abdomen and chest Musculoskeletal free of  major deformities Skin clear -no rashes Neurologic  normal Lower extremities 3+ femoral and dorsalis pedis pulses palpable bilaterally with no edema  Today I ordered a carotid duplex exam which are reviewed and interpreted. Left carotid endarterectomy site and the right carotid system are both widely patent. Also ordered lower strandy ABIs in duplex scans. He has bilateral femoral-popliteal grafts and an aortobifemoral  graft all of which are widely patent with triphasic flow in both feet and ABIs exceeding 1.0         Assessment:     Doing well post aortobifemoral bypass grafting, bilateral femoral-popliteal bypass grafting, by lateral aortorenal bypass grafting, left carotid endarterectomy. Patient recently had LVAD inserted and is followed by Dr.Bensimohn    Plan:     Return in one year for repeat ABIs, duplex scan of bilateral femoral-popliteal grafts, and carotid duplex exam

## 2014-04-01 ENCOUNTER — Encounter: Payer: Self-pay | Admitting: Internal Medicine

## 2014-04-01 ENCOUNTER — Ambulatory Visit (HOSPITAL_COMMUNITY): Payer: Self-pay | Admitting: *Deleted

## 2014-04-01 ENCOUNTER — Other Ambulatory Visit: Payer: Self-pay | Admitting: *Deleted

## 2014-04-01 ENCOUNTER — Other Ambulatory Visit (HOSPITAL_COMMUNITY): Payer: Self-pay | Admitting: Internal Medicine

## 2014-04-01 LAB — PROTIME-INR: INR: 1.9 — AB (ref <=1.1)

## 2014-04-01 MED ORDER — LEVOTHYROXINE SODIUM 125 MCG PO TABS: 125.0000 ug | ORAL_TABLET | Freq: Every day | ORAL | Status: DC

## 2014-04-07 ENCOUNTER — Telehealth: Payer: Self-pay | Admitting: *Deleted

## 2014-04-07 DIAGNOSIS — R7989 Other specified abnormal findings of blood chemistry: Secondary | ICD-10-CM

## 2014-04-07 NOTE — Telephone Encounter (Signed)
lmtcb  Need to schedule 6 week f/u lab for TSH (abnormal 5/26). Advised to increase Synthroid 5/28.

## 2014-04-08 ENCOUNTER — Telehealth: Payer: Self-pay | Admitting: *Deleted

## 2014-04-08 ENCOUNTER — Encounter: Payer: Self-pay | Admitting: Internal Medicine

## 2014-04-08 ENCOUNTER — Other Ambulatory Visit (HOSPITAL_COMMUNITY): Payer: Self-pay | Admitting: Internal Medicine

## 2014-04-08 ENCOUNTER — Ambulatory Visit: Payer: Self-pay | Admitting: *Deleted

## 2014-04-08 LAB — PROTIME-INR: INR: 2.1 — AB (ref <=1.1)

## 2014-04-08 NOTE — Telephone Encounter (Signed)
Called [name] per Junie Bame, NP and instructed her to stop pt's Ramipril. Asked if they could come by VAD clinic within next two weeks for BP check. [name] verbalized understanding of same.

## 2014-04-12 ENCOUNTER — Telehealth (HOSPITAL_COMMUNITY): Payer: Self-pay | Admitting: *Deleted

## 2014-04-12 DIAGNOSIS — Z95811 Presence of heart assist device: Secondary | ICD-10-CM

## 2014-04-12 DIAGNOSIS — I509 Heart failure, unspecified: Secondary | ICD-10-CM

## 2014-04-12 MED ORDER — FUROSEMIDE 20 MG PO TABS: 20.0000 mg | ORAL_TABLET | ORAL | Status: DC

## 2014-04-12 NOTE — Telephone Encounter (Signed)
Called pt per Dr. Haroldine Laws and instructed to decrease lasix dose to Monday and Friday; will check BMP at next INR check on 04/22/14. Pt verbalized understanding of same.

## 2014-04-13 ENCOUNTER — Other Ambulatory Visit (HOSPITAL_COMMUNITY): Payer: Self-pay | Admitting: *Deleted

## 2014-04-13 DIAGNOSIS — N189 Chronic kidney disease, unspecified: Secondary | ICD-10-CM

## 2014-04-13 DIAGNOSIS — Z95811 Presence of heart assist device: Secondary | ICD-10-CM

## 2014-04-13 DIAGNOSIS — Z7901 Long term (current) use of anticoagulants: Secondary | ICD-10-CM

## 2014-04-13 NOTE — Telephone Encounter (Signed)
Follow up     Pt want to know if he can have his labs drawn on 7-2 when he is in the area seeing Dr bensimhon?

## 2014-04-13 NOTE — Telephone Encounter (Signed)
Advised ok to have f/u TSH lab drawn at appt with Dr. Haroldine Laws on 7/2.  Pt is agreeable to this.

## 2014-04-14 ENCOUNTER — Encounter: Payer: Self-pay | Admitting: Internal Medicine

## 2014-04-15 ENCOUNTER — Encounter (HOSPITAL_COMMUNITY): Payer: Medicare Other

## 2014-04-16 ENCOUNTER — Ambulatory Visit (HOSPITAL_COMMUNITY): Payer: Self-pay | Admitting: *Deleted

## 2014-04-16 ENCOUNTER — Ambulatory Visit (HOSPITAL_COMMUNITY)
Admission: RE | Admit: 2014-04-16 | Discharge: 2014-04-16 | Disposition: A | Discharge status: Home / Self Care | Payer: Medicare Other | Source: Ambulatory Visit | Attending: Internal Medicine | Admitting: Internal Medicine

## 2014-04-16 DIAGNOSIS — N189 Chronic kidney disease, unspecified: Secondary | ICD-10-CM

## 2014-04-16 DIAGNOSIS — Z95811 Presence of heart assist device: Secondary | ICD-10-CM

## 2014-04-16 DIAGNOSIS — Z7901 Long term (current) use of anticoagulants: Secondary | ICD-10-CM

## 2014-04-16 DIAGNOSIS — I5022 Chronic systolic (congestive) heart failure: Secondary | ICD-10-CM

## 2014-04-16 LAB — PROTIME-INR
INR: 1.84 — ABNORMAL HIGH (ref 0.00–1.49)
Prothrombin Time: 20.7 seconds — ABNORMAL HIGH (ref 11.6–15.2)

## 2014-04-16 LAB — BASIC METABOLIC PANEL
BUN: 29 mg/dL — AB (ref 6–23)
CO2: 23 mEq/L (ref 19–32)
CREATININE: 1.76 mg/dL — AB (ref 0.50–1.35)
Calcium: 9.3 mg/dL (ref 8.4–10.5)
Chloride: 101 mEq/L (ref 96–112)
GFR calc non Af Amer: 36 mL/min — ABNORMAL LOW (ref >90)
GFR, EST AFRICAN AMERICAN: 42 mL/min — AB (ref >90)
Glucose, Bld: 91 mg/dL (ref 70–99)
Potassium: 4.7 mEq/L (ref 3.7–5.3)
Sodium: 139 mEq/L (ref 137–147)

## 2014-04-19 ENCOUNTER — Telehealth (HOSPITAL_COMMUNITY): Payer: Self-pay | Admitting: *Deleted

## 2014-04-19 NOTE — Telephone Encounter (Signed)
[name] called to report patient having increased SOB with increased pedal edema.  States he has been gaining 1 lb per day above dry weight of 190 lbs for a few days, then returns to baseline. Noted increased SOB with any activity outside "in the heat".  Denies PND, increased orthopnea, abdominal distention, CP, palpitations, or ICD shocks.  Pt decreased his lasix dose from MWF to MF for rising creatinine.  Instructed [name] per Junie Bame, NP to give Lasix 40 mg one time dose today, then increase weekly dosing back to 20 mg on MWF. If symptoms worsen or do not improve, we need to see patient in VAD clinic and [name] will call.

## 2014-04-26 ENCOUNTER — Other Ambulatory Visit (HOSPITAL_COMMUNITY): Payer: Self-pay | Admitting: Internal Medicine

## 2014-04-26 ENCOUNTER — Ambulatory Visit (HOSPITAL_COMMUNITY): Payer: Self-pay | Admitting: *Deleted

## 2014-04-26 LAB — PROTIME-INR: INR: 2.4 — AB (ref <=1.1)

## 2014-04-30 ENCOUNTER — Other Ambulatory Visit (HOSPITAL_COMMUNITY): Payer: Self-pay | Admitting: Cardiology

## 2014-04-30 DIAGNOSIS — I5022 Chronic systolic (congestive) heart failure: Secondary | ICD-10-CM

## 2014-05-06 ENCOUNTER — Encounter (HOSPITAL_COMMUNITY): Payer: Medicare Other

## 2014-05-06 ENCOUNTER — Other Ambulatory Visit (HOSPITAL_COMMUNITY): Payer: Self-pay | Admitting: *Deleted

## 2014-05-06 ENCOUNTER — Ambulatory Visit (HOSPITAL_COMMUNITY)
Admission: RE | Admit: 2014-05-06 | Discharge: 2014-05-06 | Disposition: A | Discharge status: Home / Self Care | Payer: Medicare Other | Source: Ambulatory Visit | Attending: Internal Medicine | Admitting: Internal Medicine

## 2014-05-06 ENCOUNTER — Ambulatory Visit (HOSPITAL_COMMUNITY): Payer: Self-pay | Admitting: *Deleted

## 2014-05-06 ENCOUNTER — Encounter (HOSPITAL_COMMUNITY): Payer: Self-pay

## 2014-05-06 ENCOUNTER — Other Ambulatory Visit (HOSPITAL_COMMUNITY): Payer: Medicare Other

## 2014-05-06 ENCOUNTER — Ambulatory Visit (HOSPITAL_BASED_OUTPATIENT_CLINIC_OR_DEPARTMENT_OTHER)
Admission: RE | Admit: 2014-05-06 | Discharge: 2014-05-06 | Disposition: A | Discharge status: Home / Self Care | Payer: Medicare Other | Source: Ambulatory Visit | Attending: Internal Medicine | Admitting: Internal Medicine

## 2014-05-06 VITALS — Wt 197.1 lb

## 2014-05-06 DIAGNOSIS — E079 Disorder of thyroid, unspecified: Secondary | ICD-10-CM

## 2014-05-06 DIAGNOSIS — E785 Hyperlipidemia, unspecified: Secondary | ICD-10-CM | POA: Insufficient documentation

## 2014-05-06 DIAGNOSIS — Z7901 Long term (current) use of anticoagulants: Secondary | ICD-10-CM | POA: Insufficient documentation

## 2014-05-06 DIAGNOSIS — Z95811 Presence of heart assist device: Secondary | ICD-10-CM

## 2014-05-06 DIAGNOSIS — N183 Chronic kidney disease, stage 3 unspecified: Secondary | ICD-10-CM | POA: Insufficient documentation

## 2014-05-06 DIAGNOSIS — I4891 Unspecified atrial fibrillation: Secondary | ICD-10-CM | POA: Insufficient documentation

## 2014-05-06 DIAGNOSIS — I4892 Unspecified atrial flutter: Secondary | ICD-10-CM | POA: Insufficient documentation

## 2014-05-06 DIAGNOSIS — I509 Heart failure, unspecified: Secondary | ICD-10-CM

## 2014-05-06 DIAGNOSIS — I252 Old myocardial infarction: Secondary | ICD-10-CM | POA: Insufficient documentation

## 2014-05-06 DIAGNOSIS — Z79899 Other long term (current) drug therapy: Secondary | ICD-10-CM | POA: Insufficient documentation

## 2014-05-06 DIAGNOSIS — I5022 Chronic systolic (congestive) heart failure: Secondary | ICD-10-CM | POA: Insufficient documentation

## 2014-05-06 DIAGNOSIS — E039 Hypothyroidism, unspecified: Secondary | ICD-10-CM | POA: Insufficient documentation

## 2014-05-06 DIAGNOSIS — I129 Hypertensive chronic kidney disease with stage 1 through stage 4 chronic kidney disease, or unspecified chronic kidney disease: Secondary | ICD-10-CM | POA: Insufficient documentation

## 2014-05-06 LAB — BASIC METABOLIC PANEL
BUN/Creatinine Ratio: 16 (ref 10–22)
BUN: 27 mg/dL (ref 8–27)
CHLORIDE: 100 mmol/L (ref 97–108)
CO2: 25 mmol/L (ref 18–29)
Calcium: 9.4 mg/dL (ref 8.6–10.2)
Creatinine, Ser: 1.7 mg/dL — ABNORMAL HIGH (ref 0.76–1.27)
GFR calc Af Amer: 45 mL/min/{1.73_m2} — ABNORMAL LOW (ref >59)
GFR, EST NON AFRICAN AMERICAN: 39 mL/min/{1.73_m2} — AB (ref >59)
Glucose: 85 mg/dL (ref 65–99)
POTASSIUM: 4.5 mmol/L (ref 3.5–5.2)
Sodium: 139 mmol/L (ref 134–144)

## 2014-05-06 LAB — COMPREHENSIVE METABOLIC PANEL
ALBUMIN: 4 g/dL (ref 3.5–5.2)
ALK PHOS: 118 U/L — AB (ref 39–117)
ALT: 17 U/L (ref 0–53)
AST: 29 U/L (ref 0–37)
Anion gap: 17 — ABNORMAL HIGH (ref 5–15)
BILIRUBIN TOTAL: 0.8 mg/dL (ref 0.3–1.2)
BUN: 18 mg/dL (ref 6–23)
CHLORIDE: 97 meq/L (ref 96–112)
CO2: 22 mEq/L (ref 19–32)
Calcium: 9.1 mg/dL (ref 8.4–10.5)
Creatinine, Ser: 1.43 mg/dL — ABNORMAL HIGH (ref 0.50–1.35)
GFR calc Af Amer: 54 mL/min — ABNORMAL LOW (ref >90)
GFR calc non Af Amer: 47 mL/min — ABNORMAL LOW (ref >90)
Glucose, Bld: 102 mg/dL — ABNORMAL HIGH (ref 70–99)
POTASSIUM: 4.2 meq/L (ref 3.7–5.3)
SODIUM: 136 meq/L — AB (ref 137–147)
Total Protein: 7.3 g/dL (ref 6.0–8.3)

## 2014-05-06 LAB — CBC
HCT: 35.6 % — ABNORMAL LOW (ref 39.0–52.0)
Hemoglobin: 11.6 g/dL — ABNORMAL LOW (ref 13.0–17.0)
MCH: 30.9 pg (ref 26.0–34.0)
MCHC: 32.6 g/dL (ref 30.0–36.0)
MCV: 94.9 fL (ref 78.0–100.0)
PLATELETS: 195 10*3/uL (ref 150–400)
RBC: 3.75 MIL/uL — ABNORMAL LOW (ref 4.22–5.81)
RDW: 17 % — ABNORMAL HIGH (ref 11.5–15.5)
WBC: 6.1 10*3/uL (ref 4.0–10.5)

## 2014-05-06 LAB — PROTIME-INR
INR: 1.9 — ABNORMAL HIGH (ref 0.8–1.2)
INR: 2.1 — AB (ref 0.8–1.2)
INR: 2.2 — ABNORMAL HIGH (ref 0.00–1.49)
INR: 2.4 — AB (ref 0.8–1.2)
INR: 2.4 — AB (ref 0.8–1.2)
PROTHROMBIN TIME: 23.3 s — AB (ref 9.1–12.0)
PROTHROMBIN TIME: 26.9 s — AB (ref 9.1–12.0)
Prothrombin Time: 24.4 seconds — ABNORMAL HIGH (ref 11.6–15.2)
Prothrombin Time: 19.7 s — ABNORMAL HIGH (ref 9.1–12.0)
Prothrombin Time: 26.8 s — ABNORMAL HIGH (ref 9.1–12.0)

## 2014-05-06 LAB — TSH: TSH: 13.15 u[IU]/mL — ABNORMAL HIGH (ref 0.350–4.500)

## 2014-05-06 LAB — AMBIG ABBREV BMP8 DEFAULT

## 2014-05-06 LAB — PRO B NATRIURETIC PEPTIDE: PRO B NATRI PEPTIDE: 2932 pg/mL — AB (ref 0–125)

## 2014-05-06 LAB — LACTATE DEHYDROGENASE: LDH: 338 U/L — ABNORMAL HIGH (ref 94–250)

## 2014-05-06 LAB — T4, FREE: Free T4: 1.6 ng/dL (ref 0.80–1.80)

## 2014-05-06 NOTE — Progress Notes (Signed)
Symptom  Yes  No  Details   Angina        x Activity:   Claudication        x How far:   Syncope        x When:     Stroke        x        TIA  2001  Orthopnea         x How many pillows: 1  PND         x How often:  CPAP       N/A How many hrs:   Pedal edema        x  occasional; resolves with Lasix (pt losing @ 2 - 3 lbs after each M/W/F lasix dose)  Abd fullness               x    N&V        x         good appetite; nausea and gagging 1 - 2 x week  Diaphoresis         x When:   Bleeding              x   Urine color        yellow  SOB       x      Activity:  Incline  Palpitations        x When:  ICD shock        x   Hospitlizaitons             x When/where/why:   ED visit        x When/where/why:  Other MD              x When/who/why:   Activity        Working 5 hrs/5 days week; walking 2 miles day; playing 9 holes golf 1-2 x week  Fluid    No limitations  < 2000   Diet    Low salt food   Vital signs: HR: 88 MAP BP:  96 O2 Sat:  100 Wt:  198.7  lbs Last wt: 199.4  lbs Ht: 6'  LVAD interrogation reveals:  Speed:   9000 RPM Flow:  4.2 Power:   4.8 PI:  7.6 Alarms:  none Events:  0 - 5 PI events; 10 PI events on 6/24 and 04/29/14 with N&V  Fixed speed:  9000 Low speed limit:  8400  LVAD exit site: [name]  VAD dressing removed and site care performed using sterile technique. Drive line exit site cleaned with Chlora prep applicators x 2, allowed to dry, and Sorbvaview dressing with Biopatch re-applied. Drive line incorporated, the velour is fully implanted at exit site. No erythema, tenderness, drainage, or foul odor noted. Drive line anchor in place. Pt denies fever or chills. Driveline dressing is being changed every week per sterile technique using Sorbaview dressing.  Pt has started showers without any reported difficulties.   Pt/caregiver deny any alarms or VAD equipment issues. VAD coordinator reviewed daily log from home for daily temperature, weight, and VAD parameters.  Pt is performing daily controller and system monitor self tests along with completing weekly and monthly maintenance for LVAD equipment.   LVAD equipment check completed and is in good working order. Back-up equipment present.  Emergency procedures and precautions reviewed.  Ramp echo performed per Dr. Haroldine Laws:   Speed    Flow  PI   Power   LVIDD      AI   Aortic openings     MR      TR  Septum        RV  9000 4.2 7.5 4.8 6.22 none 0/0 trivial trivial midline Mild - mod HK  9200 4.2 7.3 5.0 6.6 none 0/0 trivial trivial midline   9400 4.3 7.0 5.3 6.2 none 0/0 trivial trivial midline   9600 4.5 6.5 5.7 6.1  0/0 trivial trivial Pulling to left                                                     At completion of ramp study, patients primary and back up controller programmed:  Fixed speed       9400             RPM  Low speed limit  8800     RPM   VAD parameters re-checked on new speed:  MAP 86 Speed:  9400 Flow:  4.4 PI:  6.4 Power:  5.4 No PI events noted   Pt completed 6 mos Intermacs f/u including EQ-5D-3L, KCCQ-12, and neurocognitive trail making.  Pt completed 6 min walk with no symptoms and no rest stops at 1050 feet.

## 2014-05-06 NOTE — Progress Notes (Signed)
*  PRELIMINARY RESULTS* Echocardiogram 2D Echocardiogram has been performed.  [name] 05/06/2014, 11:59 AM

## 2014-05-06 NOTE — Patient Instructions (Signed)
1.  Increase Lasix to 20 mg every other day. 2.  No change in coumadin dose; re-check INR 05/25/14. 3.  Return to [place] clinic 6 weeks.

## 2014-05-09 NOTE — Addendum Note (Signed)
Encounter addended by: Jolaine Artist, MD on: 05/09/2014  3:18 PM<BR>     Documentation filed: Charges VN

## 2014-05-09 NOTE — Progress Notes (Signed)
Patient ID: [name], male   DOB: Jul 19, 1941, 73 y.o.   MRN: [id number]  PCP: Dr. Karolee Stamps ([place])  Subjective:  [name] is a 73 yo male with h/o VT, PAD and severe systolic HF (EF 67-67%) due to mixed ischemic/nonischemic CM he is s/p HM IILVAD implant for destination therapy on October 19, 2013. His course was complicated by VT, syncope, A fib/Aflutter and persistent low output felt to be due to RHF. RHC showed low cardiac output but no evidence of RV failure on Milrinone. He was felt to be volume depleted. He was discharged home on 11/18/13 on mirlinone and PRN lasix 20 mg for a weight of 200 pounds or greater. Milrinone has since been weaned off.   Follow up:  Last visit decreased amiodarone to 200 mg daily. Doing well though we have been struggling with cardiorenal syndrome. Lasix had been decreased to 20 mg on Mon and Friday for increasing creatinine, but after one week, patient experienced pedal edema and SOB.  Lasix increased back to 20 mg MWF on 04/19/14.  He reports weight loss of 2 - 3 lbs after taking Lasix dose. Weight at home  191 - 196 lbs. Working PT and is walking about 2 miles a day. Denies orthopnea, CP or edema. + SOB with inclines but at baseline.  Compliant with medications. Not following a low salt diet. Drinking around 2L a day. Still playing golf.   Ramipril 2.5 mg bid started last visit for HTN, but stopped due to rising creatinine. MAP today 96.  Denies LVAD alarms.  Denies driveline trauma, erythema or drainage.  Denies ICD shocks.    Reports taking Coumadin as prescribed and adherence to anticoagulation based dietary restrictions.  Denies bright red blood per rectum or melena, no dark urine or hematuria.     Past Medical History  Diagnosis Date  . CAD (coronary artery disease)     s/p MI 1982;  s/p DES to CFX 2011;  s/p NSTEMI 4/12 in setting of VTach;  cath 7/12:  LM ok, LAD mid 30-40%, Dx's with 40%, mCFX stent patent, prox to mid RCA  occluded with R to R and L to R collats.  Medical Tx was continued  . Bradycardia   . Ventricular tachycardia     s/p AICD;   h/o ICD shock;  Amiodarone Rx. S/P ablation in 2012  . 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 HTN (hypertension)   . HLD (hyperlipidemia)   . Cytopenia   . Hypothyroidism   . Renal insufficiency   . Claudication   . Chronic systolic heart failure   . Ischemic cardiomyopathy     echo 7/12:  EF 25-30%, mild AI, mod MR, mod LAE  . TIA (transient ischemic attack) 2001  . CVD (cerebrovascular disease)     left CEA 2008  . Stroke   . Anemia   . Inappropriate therapy from implantable cardioverter-defibrillator 04/02/2013    ATP for sinus tach  SVT wavelet identified SVT but was no  passive   . Myocardial infarction 1992  . Anginal pain   . Automatic implantable cardioverter-defibrillator in situ     Medtronic Evera device serial number [id number]    . Sleep apnea     denies wearing mask (05/25/2013)  . Arthritis     "  a little in my knees" (05/25/2013)  . Melanoma of ear 2013    "near left ear" (05/25/2013)  . 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 CHF (congestive heart failure)     Current Outpatient Prescriptions  Medication Sig Dispense Refill  . amiodarone (PACERONE) 200 MG tablet Take 1 tablet (200 mg total) by mouth daily.  30 tablet  6  . amLODipine (NORVASC) 10 MG tablet Take one tab daily  60 tablet  6  . aspirin 81 MG tablet Take 81 mg by mouth daily.       [name] atorvastatin (LIPITOR) 80 MG tablet Take 80 mg by mouth daily.       [name] CALCIUM-MAGNESIUM-ZINC PO Take 3 tablets by mouth at bedtime. 1000 mg calcium, 400 mg magnesium, 25 mg zinc      . Docusate Sodium (DSS) 100 MG CAPS Take 200 mg by mouth daily as needed.  60 each  6  . ferrous gluconate (FERGON) 324 MG tablet Take 1 tablet (324 mg total) by mouth 3 (three) times  daily with meals.  90 tablet  3  . furosemide (LASIX) 20 MG tablet Take 20 mg by mouth 3 (three) times a week. Every Mon/Wed/Fri      . levothyroxine (SYNTHROID) 125 MCG tablet Take 1 tablet (125 mcg total) by mouth daily.  30 tablet  11  . Multiple Vitamin (MULTIVITAMIN) tablet Take 1 tablet by mouth daily.       . Multiple Vitamins-Minerals (EYE VITAMINS PO) Take 1 capsule by mouth 2 (two) times daily.       . nitroGLYCERIN (NITROSTAT) 0.4 MG SL tablet Place 0.4 mg under the tongue every 5 (five) minutes as needed for chest pain. For chest pain      . pantoprazole (PROTONIX) 40 MG tablet Take 1 tablet (40 mg total) by mouth daily.  30 tablet  6  . ramipril (ALTACE) 2.5 MG capsule Take 1 capsule (2.5 mg total) by mouth 2 (two) times daily.  60 capsule  6  . ranolazine (RANEXA) 500 MG 12 hr tablet Take 1 tablet (500 mg total) by mouth 2 (two) times daily.  60 tablet  6  . warfarin (COUMADIN) 2 MG tablet As directed for INR goal 2 - 2.5  60 tablet  12   No current facility-administered medications for this encounter.    Demerol; Meperidine hcl; and Opium  REVIEW OF SYSTEMS: All systems negative except as listed in HPI, PMH and Problem list.  LVAD INTERROGATION:   HeartMate II LVAD:   Flow 4.6 liters/min Speed 9000 Power 4.9 PI 8.0 Alarms: none Events: rare PI event on most days and then on days he has the nausea and gagging has 10 Fixed speed: 9000 Low speed limit:  8400  I reviewed the LVAD parameters from today, and compared the results to the patient's prior recorded data.  No programming changes were made.  The LVAD is functioning within specified parameters.  The patient performs LVAD self-test daily.  LVAD interrogation was negative for any significant power changes, alarms or PI events/speed drops.  LVAD equipment check completed and is in good working order.  Back-up equipment present.   LVAD education done on emergency procedures and precautions and reviewed exit site care.     Filed Vitals:   05/06/14 1058  Weight: 197 lb 1 oz (89.387 kg)    Physical Exam: GENERAL: Well appearing, male who presents to clinic today in no acute distress. Wife present HEENT: normal  NECK: Supple, JVP 8-9 .  2+ bilaterally, no bruits.  No lymphadenopathy or thyromegaly appreciated.   CARDIAC:  Mechanical heart sounds with LVAD hum present.  LUNGS:  Clear to auscultation bilaterally.  ABDOMEN:  Soft, round, nontender, positive bowel sounds x4.     LVAD exit site: well-healed and incorporated.  Dressing dry and intact.  No erythema or drainage.  Stabilization device present and accurately applied.  Driveline dressing is being changed weekly per sterile technique. EXTREMITIES:  Warm and dry, no cyanosis, clubbing, bilateral 1+ edema NEUROLOGIC:  Alert and oriented x 4.  Gait steady.  No aphasia.  No dysarthria.  Affect pleasant.     ASSESSMENT AND PLAN:   1. Chronic systolic HF: s/p LVAD 61/4431 - Overall NYHA II symptoms. But continues to struggle with cardiorenal syndrome +/- RHF.  - Ramp study done today and VAD speed turned up to 9400 (see separate note - I was present for ad supervised entire ramp echo) - + volume overload. Will increase lasix to 20 mg every other day. - Has suffered with orthostatic hypotension in the past and was on Midodrine which has been weaned off. MAPs sslightly high. Was unable to tolerate Ramipril 2.5, Will not make any changes to anti-HTN regimen today. - Reinforced the need and importance of daily weights, a low sodium diet, and fluid restriction (less than 2 L a day). Instructed to call the HF clinic if weight increases more than 3 lbs overnight or 5 lbs in a week.  2) HTN - See above. MAP 96; no ACE-I due to CRI. Continue norvasc 10 mg daily.  3) LVAD - LVAD parameters stable. Ramp today; increase speed to 9400 RPM. - Few PI events. Days that he has more correlates with the days he is nausea and gagging. - Check BMET, LDH, INR, CBC and pro-BNP   4) CKD, stage III - Creatinine has been trending up since discontinuation of milrinone; increased LVAD speed today. BUN has been stable. Will continue to follow but new baseline may be closer to 1.4-1.6 5) Anticoagulation management: - Check INR today and will address accordingly. 6) AFib/Flutter - Will continue amiodarone 200 mg daily. Follow up with Dr. Caryl Comes. TSH and T4 drawn today per Dr. Olin Pia request.  Return to clinic 6 weeks   Total time spent 60 minutes. Over 3/4 ofthat time spent personally performing RAMP echo and discussing above.   Glori Bickers MD 2:49 PM

## 2014-05-23 ENCOUNTER — Encounter: Payer: Self-pay | Admitting: Internal Medicine

## 2014-05-24 ENCOUNTER — Other Ambulatory Visit (HOSPITAL_COMMUNITY): Payer: Self-pay | Admitting: Internal Medicine

## 2014-05-25 ENCOUNTER — Ambulatory Visit (HOSPITAL_COMMUNITY): Payer: Self-pay | Admitting: *Deleted

## 2014-05-25 LAB — PROTIME-INR
INR: 2.6 — AB (ref 0.8–1.2)
Prothrombin Time: 26.5 s — ABNORMAL HIGH (ref 9.1–12.0)

## 2014-06-03 ENCOUNTER — Other Ambulatory Visit (HOSPITAL_COMMUNITY): Payer: Self-pay | Admitting: *Deleted

## 2014-06-03 ENCOUNTER — Ambulatory Visit (HOSPITAL_BASED_OUTPATIENT_CLINIC_OR_DEPARTMENT_OTHER)
Admission: RE | Admit: 2014-06-03 | Discharge: 2014-06-03 | Disposition: A | Discharge status: Home / Self Care | Payer: Medicare Other | Source: Ambulatory Visit | Attending: Internal Medicine | Admitting: Internal Medicine

## 2014-06-03 ENCOUNTER — Ambulatory Visit (HOSPITAL_COMMUNITY): Payer: Self-pay | Admitting: *Deleted

## 2014-06-03 ENCOUNTER — Encounter (HOSPITAL_COMMUNITY): Payer: Self-pay

## 2014-06-03 VITALS — BP 100/0 | HR 84 | Wt 197.5 lb

## 2014-06-03 DIAGNOSIS — I2510 Atherosclerotic heart disease of native coronary artery without angina pectoris: Secondary | ICD-10-CM | POA: Diagnosis not present

## 2014-06-03 DIAGNOSIS — I5022 Chronic systolic (congestive) heart failure: Secondary | ICD-10-CM

## 2014-06-03 DIAGNOSIS — E785 Hyperlipidemia, unspecified: Secondary | ICD-10-CM | POA: Diagnosis not present

## 2014-06-03 DIAGNOSIS — Z95811 Presence of heart assist device: Secondary | ICD-10-CM

## 2014-06-03 DIAGNOSIS — I4729 Other ventricular tachycardia: Secondary | ICD-10-CM | POA: Diagnosis not present

## 2014-06-03 DIAGNOSIS — E039 Hypothyroidism, unspecified: Secondary | ICD-10-CM | POA: Diagnosis not present

## 2014-06-03 DIAGNOSIS — I130 Hypertensive heart and chronic kidney disease with heart failure and stage 1 through stage 4 chronic kidney disease, or unspecified chronic kidney disease: Secondary | ICD-10-CM | POA: Diagnosis not present

## 2014-06-03 DIAGNOSIS — D649 Anemia, unspecified: Secondary | ICD-10-CM | POA: Diagnosis not present

## 2014-06-03 DIAGNOSIS — I679 Cerebrovascular disease, unspecified: Secondary | ICD-10-CM | POA: Diagnosis not present

## 2014-06-03 DIAGNOSIS — Z7901 Long term (current) use of anticoagulants: Secondary | ICD-10-CM

## 2014-06-03 DIAGNOSIS — I4891 Unspecified atrial fibrillation: Secondary | ICD-10-CM

## 2014-06-03 DIAGNOSIS — Z95810 Presence of automatic (implantable) cardiac defibrillator: Secondary | ICD-10-CM | POA: Diagnosis not present

## 2014-06-03 DIAGNOSIS — N183 Chronic kidney disease, stage 3 unspecified: Secondary | ICD-10-CM | POA: Diagnosis not present

## 2014-06-03 DIAGNOSIS — Z9861 Coronary angioplasty status: Secondary | ICD-10-CM | POA: Diagnosis not present

## 2014-06-03 DIAGNOSIS — I252 Old myocardial infarction: Secondary | ICD-10-CM | POA: Diagnosis not present

## 2014-06-03 DIAGNOSIS — Z87891 Personal history of nicotine dependence: Secondary | ICD-10-CM | POA: Diagnosis not present

## 2014-06-03 DIAGNOSIS — I428 Other cardiomyopathies: Secondary | ICD-10-CM | POA: Diagnosis not present

## 2014-06-03 DIAGNOSIS — I472 Ventricular tachycardia: Secondary | ICD-10-CM | POA: Diagnosis not present

## 2014-06-03 DIAGNOSIS — Z7982 Long term (current) use of aspirin: Secondary | ICD-10-CM | POA: Diagnosis not present

## 2014-06-03 DIAGNOSIS — M1710 Unilateral primary osteoarthritis, unspecified knee: Secondary | ICD-10-CM | POA: Diagnosis not present

## 2014-06-03 DIAGNOSIS — I509 Heart failure, unspecified: Secondary | ICD-10-CM | POA: Diagnosis not present

## 2014-06-03 DIAGNOSIS — I2589 Other forms of chronic ischemic heart disease: Secondary | ICD-10-CM | POA: Diagnosis not present

## 2014-06-03 DIAGNOSIS — Z79899 Other long term (current) drug therapy: Secondary | ICD-10-CM | POA: Diagnosis not present

## 2014-06-03 DIAGNOSIS — I209 Angina pectoris, unspecified: Secondary | ICD-10-CM | POA: Diagnosis not present

## 2014-06-03 DIAGNOSIS — I739 Peripheral vascular disease, unspecified: Secondary | ICD-10-CM | POA: Diagnosis not present

## 2014-06-03 DIAGNOSIS — N189 Chronic kidney disease, unspecified: Secondary | ICD-10-CM | POA: Diagnosis not present

## 2014-06-03 DIAGNOSIS — G4730 Sleep apnea, unspecified: Secondary | ICD-10-CM | POA: Diagnosis not present

## 2014-06-03 DIAGNOSIS — Z8673 Personal history of transient ischemic attack (TIA), and cerebral infarction without residual deficits: Secondary | ICD-10-CM | POA: Diagnosis not present

## 2014-06-03 LAB — CBC
HCT: 34.1 % — ABNORMAL LOW (ref 39.0–52.0)
Hemoglobin: 11.1 g/dL — ABNORMAL LOW (ref 13.0–17.0)
MCH: 30.3 pg (ref 26.0–34.0)
MCHC: 32.6 g/dL (ref 30.0–36.0)
MCV: 93.2 fL (ref 78.0–100.0)
PLATELETS: 186 10*3/uL (ref 150–400)
RBC: 3.66 MIL/uL — ABNORMAL LOW (ref 4.22–5.81)
RDW: 16.4 % — AB (ref 11.5–15.5)
WBC: 6.4 10*3/uL (ref 4.0–10.5)

## 2014-06-03 LAB — COMPREHENSIVE METABOLIC PANEL
ALT: 16 U/L (ref 0–53)
AST: 28 U/L (ref 0–37)
Albumin: 4 g/dL (ref 3.5–5.2)
Alkaline Phosphatase: 127 U/L — ABNORMAL HIGH (ref 39–117)
Anion gap: 18 — ABNORMAL HIGH (ref 5–15)
BUN: 29 mg/dL — ABNORMAL HIGH (ref 6–23)
CO2: 21 mEq/L (ref 19–32)
CREATININE: 1.72 mg/dL — AB (ref 0.50–1.35)
Calcium: 9.2 mg/dL (ref 8.4–10.5)
Chloride: 100 mEq/L (ref 96–112)
GFR calc Af Amer: 43 mL/min — ABNORMAL LOW (ref >90)
GFR calc non Af Amer: 37 mL/min — ABNORMAL LOW (ref >90)
Glucose, Bld: 103 mg/dL — ABNORMAL HIGH (ref 70–99)
Potassium: 4 mEq/L (ref 3.7–5.3)
SODIUM: 139 meq/L (ref 137–147)
TOTAL PROTEIN: 7 g/dL (ref 6.0–8.3)
Total Bilirubin: 0.7 mg/dL (ref 0.3–1.2)

## 2014-06-03 LAB — PROTIME-INR
INR: 2.16 — AB (ref 0.00–1.49)
PROTHROMBIN TIME: 24.1 s — AB (ref 11.6–15.2)

## 2014-06-03 LAB — PRO B NATRIURETIC PEPTIDE: Pro B Natriuretic peptide (BNP): 2493 pg/mL — ABNORMAL HIGH (ref 0–125)

## 2014-06-03 LAB — LACTATE DEHYDROGENASE: LDH: 362 U/L — AB (ref 94–250)

## 2014-06-03 MED ORDER — AMIODARONE HCL 200 MG PO TABS: 400.0000 mg | ORAL_TABLET | Freq: Two times a day (BID) | ORAL | Status: DC

## 2014-06-03 NOTE — Progress Notes (Signed)
Patient ID: [name], male   DOB: [birth year], 73 y.o.   MRN: [id number]  PCP: Dr. Karolee Stamps ([place])   [name] is a 73 yo male with h/o VT, PAD and severe systolic HF (EF 67-89%) due to mixed ischemic/nonischemic CM he is s/p HM IILVAD implant for destination therapy on October 19, 2013. His course was complicated by VT, syncope, A fib/Aflutter and persistent low output felt to be due to RHF. RHC showed low cardiac output but no evidence of RV failure on Milrinone. He was felt to be volume depleted. He was discharged home on 11/18/13 on mirlinone and PRN lasix 20 mg for a weight of 200 pounds or greater. Milrinone has since been weaned off.   Acute visit for Heart Failure/LVAD:  Patient called today with abrupt diaphoresis and SOB while at work. Once he sat down and rested reports feeling much better. Weight has been stable at home 189-192 lbs. Denies SOB until today. No palpitation, orthopnea, PND or CP. + bilateral LE edema. Still walking about 2 miles a day. Complaint with medications. Following low salt diet and drinking about 2L a day. Playing golf and continues to work PT.    Denies LVAD alarms.  Denies driveline trauma, erythema or drainage.  Denies ICD shocks.    Reports taking Coumadin as prescribed and adherence to anticoagulation based dietary restrictions.  Denies bright red blood per rectum or melena, no dark urine or hematuria.     Past Medical History  Diagnosis Date  . CAD (coronary artery disease)     s/p MI 1982;  s/p DES to CFX 2011;  s/p NSTEMI 4/12 in setting of VTach;  cath 7/12:  LM ok, LAD mid 30-40%, Dx's with 40%, mCFX stent patent, prox to mid RCA occluded with R to R and L to R collats.  Medical Tx was continued  . Bradycardia   . Ventricular tachycardia     s/p AICD;   h/o ICD shock;  Amiodarone Rx. S/P ablation in 2012  . PVD (peripheral vascular disease)     h/o claudication;  s/p R fem to pop BPG 3/11;  s/p L CFA BPG 7/03;  s/p repair of  inf AAA 6/03;  s/p aorta to bilat renal BPG 6/03  . HTN (hypertension)   . HLD (hyperlipidemia)   . Cytopenia   . Hypothyroidism   . Renal insufficiency   . Claudication   . Chronic systolic heart failure   . Ischemic cardiomyopathy     echo 7/12:  EF 25-30%, mild AI, mod MR, mod LAE  . TIA (transient ischemic attack) 2001  . CVD (cerebrovascular disease)     left CEA 2008  . Stroke   . Anemia   . Inappropriate therapy from implantable cardioverter-defibrillator 04/02/2013    ATP for sinus tach  SVT wavelet identified SVT but was no  passive   . Myocardial infarction 1992  . Anginal pain   . Automatic implantable cardioverter-defibrillator in situ     Medtronic Evera device serial number [id number]    . Sleep apnea     denies wearing mask (05/25/2013)  . Arthritis     "a little in my knees" (05/25/2013)  . Melanoma of ear 2013    "near left ear" (05/25/2013)  . 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 CHF (congestive heart failure)     Current Outpatient Prescriptions  Medication Sig Dispense Refill  . amiodarone (PACERONE) 200 MG tablet Take 1 tablet (  200 mg total) by mouth daily.  30 tablet  6  . amLODipine (NORVASC) 10 MG tablet Take one tab daily  60 tablet  6  . aspirin 81 MG tablet Take 81 mg by mouth daily.       [name] atorvastatin (LIPITOR) 80 MG tablet Take 80 mg by mouth daily.       [name] CALCIUM-MAGNESIUM-ZINC PO Take 3 tablets by mouth at bedtime. 1000 mg calcium, 400 mg magnesium, 25 mg zinc      . Docusate Sodium (DSS) 100 MG CAPS Take 200 mg by mouth daily as needed.  60 each  6  . ferrous gluconate (FERGON) 324 MG tablet Take 1 tablet (324 mg total) by mouth 3 (three) times daily with meals.  90 tablet  3  . furosemide (LASIX) 20 MG tablet Take 20 mg by mouth 3 (three) times a week. Every Mon/Wed/Fri      . levothyroxine (SYNTHROID) 125 MCG tablet Take 1 tablet (125 mcg total) by mouth daily.  30 tablet  11  . Multiple Vitamin (MULTIVITAMIN) tablet Take 1 tablet by mouth  daily.       . Multiple Vitamins-Minerals (EYE VITAMINS PO) Take 1 capsule by mouth 2 (two) times daily.       . nitroGLYCERIN (NITROSTAT) 0.4 MG SL tablet Place 0.4 mg under the tongue every 5 (five) minutes as needed for chest pain. For chest pain      . pantoprazole (PROTONIX) 40 MG tablet Take 1 tablet (40 mg total) by mouth daily.  30 tablet  6  . ramipril (ALTACE) 2.5 MG capsule Take 1 capsule (2.5 mg total) by mouth 2 (two) times daily.  60 capsule  6  . ranolazine (RANEXA) 500 MG 12 hr tablet Take 1 tablet (500 mg total) by mouth 2 (two) times daily.  60 tablet  6  . warfarin (COUMADIN) 2 MG tablet As directed for INR goal 2 - 2.5  60 tablet  12   No current facility-administered medications for this encounter.    Demerol; Meperidine hcl; and Opium  REVIEW OF SYSTEMS: All systems negative except as listed in HPI, PMH and Problem list.  LVAD INTERROGATION:   HeartMate II LVAD:   Speed: 9400 Flow 4.3 liters/min Power 5.3 PI 4.5 Alarms: none Events: 60 PI events today; 90 PI events 7/29; > 50 PI events 7/28 Fixed speed: 9400 Low speed limit:  8600  I reviewed the LVAD parameters from today, and compared the results to the patient's prior recorded data.  Decreased set speed to 9000 and back-up speed to 8400. The LVAD is functioning within specified parameters.  The patient performs LVAD self-test daily.  LVAD interrogation was negative for any significant power changes, alarms or PI events/speed drops.  LVAD equipment check completed and is in good working order.  Back-up equipment present.   LVAD education done on emergency procedures and precautions and reviewed exit site care.    Filed Vitals:   06/03/14 1303  BP: 100/0  Pulse: 84  Weight: 197 lb 8 oz (89.585 kg)  SpO2: 96%    Physical Exam: GENERAL: Well appearing, male who presents to clinic today in no acute distress. Wife present HEENT: normal  NECK: Supple, JVP 8-9 .  2+ bilaterally, no bruits.  No lymphadenopathy  or thyromegaly appreciated.   CARDIAC:  Mechanical heart sounds with LVAD hum present.  LUNGS:  Clear to auscultation bilaterally.  ABDOMEN:  Soft, round, nontender, positive bowel sounds x4.  LVAD exit site: well-healed and incorporated.  Dressing dry and intact.  No erythema or drainage.  Stabilization device present and accurately applied.  Driveline dressing is being changed weekly per sterile technique. EXTREMITIES:  Warm and dry, no cyanosis, clubbing, bilateral 2+ edema NEUROLOGIC:  Alert and oriented x 4.  Gait steady.  No aphasia.  No dysarthria.  Affect pleasant.     EKG: Afib with PVCs 81 bpm  ASSESSMENT AND PLAN:   1. Chronic systolic HF: s/p LVAD 40/7680 - Overall NYHA II symptoms and volume status increased.  - Will have patient take lasix 20 mg today.  - Reinforced the need and importance of daily weights, a low sodium diet, and fluid restriction (less than 2 L a day). Instructed to call the HF clinic if weight increases more than 3 lbs overnight or 5 lbs in a week.  2. Afib/Flutter - Patient presented with diaphoresis and SOB while at work. Told to come to clinic. [employer] interrogated showing volume status elevated, loss of BiV pacing and patient had been in Afib since early June. He has increased PI events >60 today. EKG confirmed Afib. Renal function has been trending up over the past couple of months and likely related to patient being back in afib and loss of atrial kick. Will increase amiodarone to 400 mg BID and schedule for DC-CV tomorrow. He has had 4 therapeutic INRs. 2) HTN - Mildly elevated. Will continue current medications and reassess once back in NSR. 3) LVAD - LVAD parameters stable, however he is having as above numerous PI events likely related to being back in afib. Will decrease LVAD speed to 9000 and back up to 8400. - Check BMET, LDH, INR, CBC and pro-BNP  4) CKD, stage III - Check BMET today. Hoping that if we get back in NSR that patients creatinine  will improve slightly.  5) Anticoagulation management: - Check INR today and will address accordingly  F/U 3 weeks. Junie Bame B  NP-C 1:10 PM  Patient seen and examined with Junie Bame, NP. We discussed all aspects of the encounter. I agree with the assessment and plan as stated above.   He has recurrent, symptomatic AF. Will need DC-CV. INR therapeutic. Will schedule for tomorrow.VAD parameters reviewed personally.  Daniel Bensimhon,MD 9:18 PM

## 2014-06-03 NOTE — Progress Notes (Signed)
[name] (caregiver) called VAD pager this am to report pt "not feeling well" at work. States EMS had been notified and she is in route to pt workplace. Asked that he be seen in clinic for feeling "weak and SOB".  Instructed her to allow EMS to evaluate and transport to ED if necessary.  Pt arrived to clinic and appears weak, leaning against wall.  States he "got too hot" at work. Says he was outside for 45 minutes walking through parking lots and experienced sudden onset of weakness, "broke out in cold sweat", and "couldn't catch my breath".  Denies chest pain, heart palpitations, or ICD shocks.    EKG reveals Afib (previously been in sinus rhythm). VAD parameters show > 60 PI events today; 92 PI events yesterday, and > 50 PI events 7/28.  Pt states he has noticed increased swelling his feet and ankles and [name] has been giving him extra Lasix doses (above the 20 mg every other day dose ordered). Home weights 190 - 192.5 lbs.  Pt denies any other heart failure symptoms, no blood in urine/stool or tea colored urine. Junie Bame, NP and Dr. Haroldine Laws in to evaluate patient.   Symptom  Yes  No  Details   Angina       x Activity:   Claudication        x How far:   Syncope        x When:     Stroke        x        TIA  2001  Orthopnea         x How many pillows: 1  PND         x How often:  CPAP       N/A How many hrs:   Pedal edema        x  occasional; taking extra Lasix for pedal edema 1 - 2 x week  Abd fullness               x    N&V        x         good appetite; nausea and gagging 1 - 2 x week  Diaphoresis        x        When: today at work; EMS notified  Bleeding              x   Urine color        yellow  SOB       x   Activity:  Acute episode today after being outdoors x 45 mins; EMS notified  Palpitations        x When:  ICD shock        x   Hospitlizaitons             x When/where/why:   ED visit        x When/where/why:  Other MD              x When/who/why:   Activity         Working 5 hrs/5 days week; walking 2 miles day  Fluid    No limitations  < 2000   Diet    Low salt food   Vital signs: HR: 84 MAP BP:  100 O2 Sat:  96 Wt:  197.8  lbs Last wt: 198.7  lbs Ht: 6'  LVAD interrogation reveals:  Speed:   9400  RPM Flow:  4.3 Power:   5.3 PI:  4.5 Alarms:  none Events:  60 PI events today; 90 PI events 7/29; > 50 PI events 7/28  Fixed speed:  9000 Low speed limit:  8400  VAD speed reduced per Dr. Haroldine Laws with following parmameters:  Speed:  9000 RPM Flow:  4.0 Power:  5.0 PI:  4.7  Primary and secondary controllers programmed:  Fixed speed:  9000 RPM  Low speed limit:  8400 RPM  History of therapeutic anticoagulation; outpatient CV scheduled for tomorrow at 1 pm with Dr. Haroldine Laws.  Pt will remain NPO after MN tonight; take am meds with small sip of water; and report to admissions tomorrow at 11:30 prior to procedure. Pt and [name] both verbalize understanding of above.

## 2014-06-03 NOTE — Patient Instructions (Signed)
1.   Increase amiodarone to 400 mg twice daily 2.  Take Lasix 20 mg today 3.  Go to admissions 11:30 - 12:00 and check in for scheduled cardioversion at 1 pm 4.  Return to [place] clinic 06/17/14 for scheduled f/u visit

## 2014-06-04 ENCOUNTER — Encounter (HOSPITAL_COMMUNITY)
Admission: RE | Disposition: A | Discharge status: Home / Self Care | Payer: Self-pay | Source: Ambulatory Visit | Attending: Internal Medicine

## 2014-06-04 ENCOUNTER — Ambulatory Visit (HOSPITAL_COMMUNITY): Payer: Medicare Other | Admitting: Certified Registered"

## 2014-06-04 ENCOUNTER — Ambulatory Visit (HOSPITAL_COMMUNITY)
Admission: RE | Admit: 2014-06-04 | Discharge: 2014-06-04 | Disposition: A | Discharge status: Home / Self Care | Payer: Medicare Other | Source: Ambulatory Visit | Attending: Internal Medicine | Admitting: Internal Medicine

## 2014-06-04 ENCOUNTER — Encounter (HOSPITAL_COMMUNITY): Payer: Medicare Other | Admitting: Certified Registered"

## 2014-06-04 ENCOUNTER — Encounter (HOSPITAL_COMMUNITY): Payer: Self-pay | Admitting: *Deleted

## 2014-06-04 DIAGNOSIS — M1710 Unilateral primary osteoarthritis, unspecified knee: Secondary | ICD-10-CM | POA: Insufficient documentation

## 2014-06-04 DIAGNOSIS — I2510 Atherosclerotic heart disease of native coronary artery without angina pectoris: Secondary | ICD-10-CM | POA: Diagnosis not present

## 2014-06-04 DIAGNOSIS — I480 Paroxysmal atrial fibrillation: Secondary | ICD-10-CM

## 2014-06-04 DIAGNOSIS — I679 Cerebrovascular disease, unspecified: Secondary | ICD-10-CM | POA: Insufficient documentation

## 2014-06-04 DIAGNOSIS — I472 Ventricular tachycardia, unspecified: Secondary | ICD-10-CM | POA: Insufficient documentation

## 2014-06-04 DIAGNOSIS — I4891 Unspecified atrial fibrillation: Secondary | ICD-10-CM | POA: Diagnosis not present

## 2014-06-04 DIAGNOSIS — N183 Chronic kidney disease, stage 3 unspecified: Secondary | ICD-10-CM | POA: Insufficient documentation

## 2014-06-04 DIAGNOSIS — D649 Anemia, unspecified: Secondary | ICD-10-CM | POA: Insufficient documentation

## 2014-06-04 DIAGNOSIS — I509 Heart failure, unspecified: Secondary | ICD-10-CM | POA: Insufficient documentation

## 2014-06-04 DIAGNOSIS — E785 Hyperlipidemia, unspecified: Secondary | ICD-10-CM | POA: Insufficient documentation

## 2014-06-04 DIAGNOSIS — Z87891 Personal history of nicotine dependence: Secondary | ICD-10-CM | POA: Insufficient documentation

## 2014-06-04 DIAGNOSIS — Z79899 Other long term (current) drug therapy: Secondary | ICD-10-CM | POA: Insufficient documentation

## 2014-06-04 DIAGNOSIS — I2589 Other forms of chronic ischemic heart disease: Secondary | ICD-10-CM | POA: Insufficient documentation

## 2014-06-04 DIAGNOSIS — I428 Other cardiomyopathies: Secondary | ICD-10-CM | POA: Insufficient documentation

## 2014-06-04 DIAGNOSIS — E039 Hypothyroidism, unspecified: Secondary | ICD-10-CM | POA: Insufficient documentation

## 2014-06-04 DIAGNOSIS — I130 Hypertensive heart and chronic kidney disease with heart failure and stage 1 through stage 4 chronic kidney disease, or unspecified chronic kidney disease: Secondary | ICD-10-CM | POA: Insufficient documentation

## 2014-06-04 DIAGNOSIS — I252 Old myocardial infarction: Secondary | ICD-10-CM | POA: Insufficient documentation

## 2014-06-04 DIAGNOSIS — I5022 Chronic systolic (congestive) heart failure: Secondary | ICD-10-CM | POA: Diagnosis not present

## 2014-06-04 DIAGNOSIS — Z95810 Presence of automatic (implantable) cardiac defibrillator: Secondary | ICD-10-CM | POA: Insufficient documentation

## 2014-06-04 DIAGNOSIS — G4730 Sleep apnea, unspecified: Secondary | ICD-10-CM | POA: Insufficient documentation

## 2014-06-04 DIAGNOSIS — N189 Chronic kidney disease, unspecified: Secondary | ICD-10-CM

## 2014-06-04 DIAGNOSIS — IMO0002 Reserved for concepts with insufficient information to code with codable children: Secondary | ICD-10-CM

## 2014-06-04 DIAGNOSIS — Z9861 Coronary angioplasty status: Secondary | ICD-10-CM | POA: Insufficient documentation

## 2014-06-04 DIAGNOSIS — Z7901 Long term (current) use of anticoagulants: Secondary | ICD-10-CM | POA: Insufficient documentation

## 2014-06-04 DIAGNOSIS — Z8673 Personal history of transient ischemic attack (TIA), and cerebral infarction without residual deficits: Secondary | ICD-10-CM | POA: Insufficient documentation

## 2014-06-04 DIAGNOSIS — I4729 Other ventricular tachycardia: Secondary | ICD-10-CM | POA: Insufficient documentation

## 2014-06-04 DIAGNOSIS — I739 Peripheral vascular disease, unspecified: Secondary | ICD-10-CM | POA: Insufficient documentation

## 2014-06-04 DIAGNOSIS — Z7982 Long term (current) use of aspirin: Secondary | ICD-10-CM | POA: Insufficient documentation

## 2014-06-04 DIAGNOSIS — Z95811 Presence of heart assist device: Secondary | ICD-10-CM

## 2014-06-04 DIAGNOSIS — I209 Angina pectoris, unspecified: Secondary | ICD-10-CM | POA: Insufficient documentation

## 2014-06-04 HISTORY — PX: CARDIOVERSION: SHX1299

## 2014-06-04 SURGERY — CARDIOVERSION
Anesthesia: Monitor Anesthesia Care

## 2014-06-04 MED ORDER — LIDOCAINE HCL (CARDIAC) 20 MG/ML IV SOLN
INTRAVENOUS | Status: DC | PRN
  Administered 2014-06-04: 60 mg via INTRAVENOUS

## 2014-06-04 MED ORDER — PROPOFOL 10 MG/ML IV BOLUS
INTRAVENOUS | Status: DC | PRN
  Administered 2014-06-04 (×3): 20 mg via INTRAVENOUS

## 2014-06-04 MED ORDER — SODIUM CHLORIDE 0.9 % IV SOLN
INTRAVENOUS | Status: DC
  Administered 2014-06-04: 12:00:00 via INTRAVENOUS

## 2014-06-04 MED ORDER — PHENYLEPHRINE HCL 10 MG/ML IJ SOLN
INTRAMUSCULAR | Status: DC | PRN
  Administered 2014-06-04 (×3): 50 ug via INTRAVENOUS

## 2014-06-04 NOTE — Anesthesia Postprocedure Evaluation (Signed)
  Anesthesia Post-op Note  Patient: [name]  Procedure(s) Performed: Procedure(s): CARDIOVERSION (N/A)  Patient Location: Endoscopy Unit  Anesthesia Type:MAC  Level of Consciousness: awake  Airway and Oxygen Therapy: Patient Spontanous Breathing  Post-op Pain: none  Post-op Assessment: Post-op Vital signs reviewed, Patient's Cardiovascular Status Stable and Respiratory Function Stable  Post-op Vital Signs: Reviewed and stable  Last Vitals:  Filed Vitals:   06/04/14 1320  BP:   Pulse:   Temp:   Resp: 18    Complications: No apparent anesthesia complications

## 2014-06-04 NOTE — H&P (View-Only) (Signed)
Patient ID: [name], male   DOB: July 28, 1941, 73 y.o.   MRN: [id number]  PCP: Dr. Karolee Stamps ([place])  Subjective:  [name] is a 73 yo male with h/o VT, PAD and severe systolic HF (EF 95-18%) due to mixed ischemic/nonischemic CM he is s/p HM IILVAD implant for destination therapy on October 19, 2013. His course was complicated by VT, syncope, A fib/Aflutter and persistent low output felt to be due to RHF. RHC showed low cardiac output but no evidence of RV failure on Milrinone. He was felt to be volume depleted. He was discharged home on 11/18/13 on mirlinone and PRN lasix 20 mg for a weight of 200 pounds or greater. Milrinone has since been weaned off.   Follow up:  Last visit decreased amiodarone to 200 mg daily. Doing well though we have been struggling with cardiorenal syndrome. Lasix had been decreased to 20 mg on Mon and Friday for increasing creatinine, but after one week, patient experienced pedal edema and SOB.  Lasix increased back to 20 mg MWF on 04/19/14.  He reports weight loss of 2 - 3 lbs after taking Lasix dose. Weight at home  191 - 196 lbs. Working PT and is walking about 2 miles a day. Denies orthopnea, CP or edema. + SOB with inclines but at baseline.  Compliant with medications. Not following a low salt diet. Drinking around 2L a day. Still playing golf.   Ramipril 2.5 mg bid started last visit for HTN, but stopped due to rising creatinine. MAP today 96.  Denies LVAD alarms.  Denies driveline trauma, erythema or drainage.  Denies ICD shocks.    Reports taking Coumadin as prescribed and adherence to anticoagulation based dietary restrictions.  Denies bright red blood per rectum or melena, no dark urine or hematuria.     Past Medical History  Diagnosis Date  . CAD (coronary artery disease)     s/p MI 1982;  s/p DES to CFX 2011;  s/p NSTEMI 4/12 in setting of VTach;  cath 7/12:  LM ok, LAD mid 30-40%, Dx's with 40%, mCFX stent patent, prox to mid RCA  occluded with R to R and L to R collats.  Medical Tx was continued  . Bradycardia   . Ventricular tachycardia     s/p AICD;   h/o ICD shock;  Amiodarone Rx. S/P ablation in 2012  . 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 HTN (hypertension)   . HLD (hyperlipidemia)   . Cytopenia   . Hypothyroidism   . Renal insufficiency   . Claudication   . Chronic systolic heart failure   . Ischemic cardiomyopathy     echo 7/12:  EF 25-30%, mild AI, mod MR, mod LAE  . TIA (transient ischemic attack) 2001  . CVD (cerebrovascular disease)     left CEA 2008  . Stroke   . Anemia   . Inappropriate therapy from implantable cardioverter-defibrillator 04/02/2013    ATP for sinus tach  SVT wavelet identified SVT but was no  passive   . Myocardial infarction 1992  . Anginal pain   . Automatic implantable cardioverter-defibrillator in situ     Medtronic Evera device serial number [id number]    . Sleep apnea     denies wearing mask (05/25/2013)  . Arthritis     "  a little in my knees" (05/25/2013)  . Melanoma of ear 2013    "near left ear" (05/25/2013)  . 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 CHF (congestive heart failure)     Current Outpatient Prescriptions  Medication Sig Dispense Refill  . amiodarone (PACERONE) 200 MG tablet Take 1 tablet (200 mg total) by mouth daily.  30 tablet  6  . amLODipine (NORVASC) 10 MG tablet Take one tab daily  60 tablet  6  . aspirin 81 MG tablet Take 81 mg by mouth daily.       [name] atorvastatin (LIPITOR) 80 MG tablet Take 80 mg by mouth daily.       [name] CALCIUM-MAGNESIUM-ZINC PO Take 3 tablets by mouth at bedtime. 1000 mg calcium, 400 mg magnesium, 25 mg zinc      . Docusate Sodium (DSS) 100 MG CAPS Take 200 mg by mouth daily as needed.  60 each  6  . ferrous gluconate (FERGON) 324 MG tablet Take 1 tablet (324 mg total) by mouth 3 (three) times  daily with meals.  90 tablet  3  . furosemide (LASIX) 20 MG tablet Take 20 mg by mouth 3 (three) times a week. Every Mon/Wed/Fri      . levothyroxine (SYNTHROID) 125 MCG tablet Take 1 tablet (125 mcg total) by mouth daily.  30 tablet  11  . Multiple Vitamin (MULTIVITAMIN) tablet Take 1 tablet by mouth daily.       . Multiple Vitamins-Minerals (EYE VITAMINS PO) Take 1 capsule by mouth 2 (two) times daily.       . nitroGLYCERIN (NITROSTAT) 0.4 MG SL tablet Place 0.4 mg under the tongue every 5 (five) minutes as needed for chest pain. For chest pain      . pantoprazole (PROTONIX) 40 MG tablet Take 1 tablet (40 mg total) by mouth daily.  30 tablet  6  . ramipril (ALTACE) 2.5 MG capsule Take 1 capsule (2.5 mg total) by mouth 2 (two) times daily.  60 capsule  6  . ranolazine (RANEXA) 500 MG 12 hr tablet Take 1 tablet (500 mg total) by mouth 2 (two) times daily.  60 tablet  6  . warfarin (COUMADIN) 2 MG tablet As directed for INR goal 2 - 2.5  60 tablet  12   No current facility-administered medications for this encounter.    Demerol; Meperidine hcl; and Opium  REVIEW OF SYSTEMS: All systems negative except as listed in HPI, PMH and Problem list.  LVAD INTERROGATION:   HeartMate II LVAD:   Flow 4.6 liters/min Speed 9000 Power 4.9 PI 8.0 Alarms: none Events: rare PI event on most days and then on days he has the nausea and gagging has 10 Fixed speed: 9000 Low speed limit:  8400  I reviewed the LVAD parameters from today, and compared the results to the patient's prior recorded data.  No programming changes were made.  The LVAD is functioning within specified parameters.  The patient performs LVAD self-test daily.  LVAD interrogation was negative for any significant power changes, alarms or PI events/speed drops.  LVAD equipment check completed and is in good working order.  Back-up equipment present.   LVAD education done on emergency procedures and precautions and reviewed exit site care.     Filed Vitals:   05/06/14 1058  Weight: 197 lb 1 oz (89.387 kg)    Physical Exam: GENERAL: Well appearing, male who presents to clinic today in no acute distress. Wife present HEENT: normal  NECK: Supple, JVP 8-9 .  2+ bilaterally, no bruits.  No lymphadenopathy or thyromegaly appreciated.   CARDIAC:  Mechanical heart sounds with LVAD hum present.  LUNGS:  Clear to auscultation bilaterally.  ABDOMEN:  Soft, round, nontender, positive bowel sounds x4.     LVAD exit site: well-healed and incorporated.  Dressing dry and intact.  No erythema or drainage.  Stabilization device present and accurately applied.  Driveline dressing is being changed weekly per sterile technique. EXTREMITIES:  Warm and dry, no cyanosis, clubbing, bilateral 1+ edema NEUROLOGIC:  Alert and oriented x 4.  Gait steady.  No aphasia.  No dysarthria.  Affect pleasant.     ASSESSMENT AND PLAN:   1. Chronic systolic HF: s/p LVAD 58/8325 - Overall NYHA II symptoms. But continues to struggle with cardiorenal syndrome +/- RHF.  - Ramp study done today and VAD speed turned up to 9400 (see separate note - I was present for ad supervised entire ramp echo) - + volume overload. Will increase lasix to 20 mg every other day. - Has suffered with orthostatic hypotension in the past and was on Midodrine which has been weaned off. MAPs sslightly high. Was unable to tolerate Ramipril 2.5, Will not make any changes to anti-HTN regimen today. - Reinforced the need and importance of daily weights, a low sodium diet, and fluid restriction (less than 2 L a day). Instructed to call the HF clinic if weight increases more than 3 lbs overnight or 5 lbs in a week.  2) HTN - See above. MAP 96; no ACE-I due to CRI. Continue norvasc 10 mg daily.  3) LVAD - LVAD parameters stable. Ramp today; increase speed to 9400 RPM. - Few PI events. Days that he has more correlates with the days he is nausea and gagging. - Check BMET, LDH, INR, CBC and pro-BNP   4) CKD, stage III - Creatinine has been trending up since discontinuation of milrinone; increased LVAD speed today. BUN has been stable. Will continue to follow but new baseline may be closer to 1.4-1.6 5) Anticoagulation management: - Check INR today and will address accordingly. 6) AFib/Flutter - Will continue amiodarone 200 mg daily. Follow up with Dr. Caryl Comes. TSH and T4 drawn today per Dr. Olin Pia request.  Return to clinic 6 weeks   Total time spent 60 minutes. Over 3/4 ofthat time spent personally performing RAMP echo and discussing above.   Glori Bickers MD 2:49 PM

## 2014-06-04 NOTE — Anesthesia Preprocedure Evaluation (Signed)
Anesthesia Evaluation  Patient identified by MRN, date of birth, ID band Patient awake    Reviewed: Allergy & Precautions, H&P , NPO status , Patient's Chart, lab work & pertinent test results, reviewed documented beta blocker date and time   Airway Mallampati: I TM Distance: >3 FB     Dental  (+) Edentulous Upper, Edentulous Lower   Pulmonary sleep apnea , former smoker,          Cardiovascular hypertension, + angina + CAD, + Past MI, + Peripheral Vascular Disease and +CHF + pacemaker + Cardiac Defibrillator Rhythm:Irregular  LVAD   Neuro/Psych TIA   GI/Hepatic   Endo/Other  Hypothyroidism   Renal/GU      Musculoskeletal   Abdominal   Peds  Hematology  (+) anemia ,   Anesthesia Other Findings   Reproductive/Obstetrics                           Anesthesia Physical Anesthesia Plan  ASA: IV  Anesthesia Plan: MAC   Post-op Pain Management:    Induction: Intravenous  Airway Management Planned: Simple Face Mask  Additional Equipment:   Intra-op Plan:   Post-operative Plan:   Informed Consent: I have reviewed the patients History and Physical, chart, labs and discussed the procedure including the risks, benefits and alternatives for the proposed anesthesia with the patient or authorized representative who has indicated his/her understanding and acceptance.     Plan Discussed with: Anesthesiologist and Surgeon  Anesthesia Plan Comments:         Anesthesia Quick Evaluation

## 2014-06-04 NOTE — Interval H&P Note (Signed)
History and Physical Interval Note:  06/04/2014 12:19 PM  [name]  has presented today for surgery, with the diagnosis of AFIB  The various methods of treatment have been discussed with the patient and family. After consideration of risks, benefits and other options for treatment, the patient has consented to  Procedure(s): CARDIOVERSION (N/A) as a surgical intervention .  The patient's history has been reviewed, patient examined, no change in status, stable for surgery.  I have reviewed the patient's chart and labs.  Questions were answered to the patient's satisfaction.     Daniel Bensimhon

## 2014-06-04 NOTE — CV Procedure (Signed)
     DIRECT CURRENT CARDIOVERSION  NAME:  [name]   MRN: [id number] DOB:  1941/06/11   ADMIT DATE: 06/04/2014   INDICATIONS: Atrial fibrillation    PROCEDURE:   Informed consent was obtained prior to the procedure. The risks, benefits and alternatives for the procedure were discussed and the patient comprehended these risks. Once an appropriate time out was taken, the patient had the defibrillator pads placed in the anterior and posterior position. The patient then underwent sedation by the anesthesia service. Once an appropriate level of sedation was achieved, the patient received a single biphasic, synchronized 200J shock with prompt conversion to sinus rhythm. No apparent complications.  VAD interrogation performed Flow 4.0 Speed 9000 PI 6.6 Power 4.7 No PI events since speed change   CONLCUSION:   1.  Successful DC-CV of AF 2. Load amio 400 bid x 2 weeks then drop to 400 per day  Benay Spice 12:40 PM

## 2014-06-04 NOTE — Transfer of Care (Signed)
Immediate Anesthesia Transfer of Care Note  Patient: [name]  Procedure(s) Performed: Procedure(s): CARDIOVERSION (N/A)  Patient Location: Endoscopy Unit  Anesthesia Type:MAC  Level of Consciousness: awake, alert , oriented and patient cooperative  Airway & Oxygen Therapy: Patient Spontanous Breathing and Patient connected to nasal cannula oxygen  Post-op Assessment: Report given to PACU RN, Post -op Vital signs reviewed and stable and Patient moving all extremities  Post vital signs: Reviewed and stable  Complications: No apparent anesthesia complications

## 2014-06-04 NOTE — Anesthesia Postprocedure Evaluation (Signed)
  Anesthesia Post-op Note  Patient: [name]  Procedure(s) Performed: Procedure(s): CARDIOVERSION (N/A)  Patient Location: Endoscopy Unit  Anesthesia Type:MAC  Level of Consciousness: awake, alert , oriented and patient cooperative  Airway and Oxygen Therapy: Patient Spontanous Breathing and Patient connected to nasal cannula oxygen  Post-op Pain: none  Post-op Assessment: Post-op Vital signs reviewed, Patient's Cardiovascular Status Stable, Respiratory Function Stable, Patent Airway and No signs of Nausea or vomiting  Post-op Vital Signs: Reviewed and stable  Last Vitals:  Filed Vitals:   06/04/14 1245  BP: 92/0  Pulse:   Temp:   Resp: 14    Complications: No apparent anesthesia complications

## 2014-06-04 NOTE — Discharge Instructions (Signed)
Electrical Cardioversion, Care After °Refer to this sheet in the next few weeks. These instructions provide you with information on caring for yourself after your procedure. Your health care provider may also give you more specific instructions. Your treatment has been planned according to current medical practices, but problems sometimes occur. Call your health care provider if you have any problems or questions after your procedure. °WHAT TO EXPECT AFTER THE PROCEDURE °After your procedure, it is typical to have the following sensations: °· Some redness on the skin where the shocks were delivered. If this is tender, a sunburn lotion or hydrocortisone cream may help. °· Possible return of an abnormal heart rhythm within hours or days after the procedure. °HOME CARE INSTRUCTIONS °· Take medicines only as directed by your health care provider. Be sure you understand how and when to take your medicine. °· Learn how to feel your pulse and check it often. °· Limit your activity for 48 hours after the procedure or as directed by your health care provider. °· Avoid or minimize caffeine and other stimulants as directed by your health care provider. °SEEK MEDICAL CARE IF: °· You feel like your heart is beating too fast or your pulse is not regular. °· You have any questions about your medicines. °· You have bleeding that will not stop. °SEEK IMMEDIATE MEDICAL CARE IF: °· You are dizzy or feel faint. °· It is hard to breathe or you feel short of breath. °· There is a change in discomfort in your chest. °· Your speech is slurred or you have trouble moving an arm or leg on one side of your body. °· You get a serious muscle cramp that does not go away. °· Your fingers or toes turn cold or blue. °Document Released: 08/12/2013 Document Revised: 03/08/2014 Document Reviewed: 08/12/2013 °[employer]® Patient Information ©2015 [employer]. This information is not intended to replace advice given to you by your health care provider.  Make sure you discuss any questions you have with your health care provider. ° °

## 2014-06-07 ENCOUNTER — Encounter (HOSPITAL_COMMUNITY): Payer: Self-pay | Admitting: Internal Medicine

## 2014-06-07 ENCOUNTER — Other Ambulatory Visit (HOSPITAL_COMMUNITY): Payer: Self-pay | Admitting: *Deleted

## 2014-06-07 DIAGNOSIS — I5022 Chronic systolic (congestive) heart failure: Secondary | ICD-10-CM

## 2014-06-07 DIAGNOSIS — I5023 Acute on chronic systolic (congestive) heart failure: Secondary | ICD-10-CM

## 2014-06-07 DIAGNOSIS — D649 Anemia, unspecified: Secondary | ICD-10-CM

## 2014-06-07 DIAGNOSIS — I50810 Right heart failure, unspecified: Secondary | ICD-10-CM

## 2014-06-07 DIAGNOSIS — I255 Ischemic cardiomyopathy: Secondary | ICD-10-CM

## 2014-06-07 DIAGNOSIS — I471 Supraventricular tachycardia: Secondary | ICD-10-CM

## 2014-06-07 MED ORDER — FERROUS GLUCONATE 324 (38 FE) MG PO TABS: 324.0000 mg | ORAL_TABLET | Freq: Three times a day (TID) | ORAL | Status: DC

## 2014-06-15 ENCOUNTER — Encounter: Payer: Self-pay | Admitting: Internal Medicine

## 2014-06-17 ENCOUNTER — Ambulatory Visit (HOSPITAL_COMMUNITY)
Admission: RE | Admit: 2014-06-17 | Discharge: 2014-06-17 | Disposition: A | Discharge status: Home / Self Care | Payer: Medicare Other | Source: Ambulatory Visit | Attending: Internal Medicine | Admitting: Internal Medicine

## 2014-06-17 ENCOUNTER — Other Ambulatory Visit (HOSPITAL_COMMUNITY): Payer: Self-pay | Admitting: *Deleted

## 2014-06-17 ENCOUNTER — Ambulatory Visit (HOSPITAL_COMMUNITY): Payer: Self-pay | Admitting: *Deleted

## 2014-06-17 VITALS — BP 104/0 | HR 75 | Ht 73.0 in | Wt 199.4 lb

## 2014-06-17 DIAGNOSIS — I480 Paroxysmal atrial fibrillation: Secondary | ICD-10-CM

## 2014-06-17 DIAGNOSIS — Z7901 Long term (current) use of anticoagulants: Secondary | ICD-10-CM

## 2014-06-17 DIAGNOSIS — I5022 Chronic systolic (congestive) heart failure: Secondary | ICD-10-CM

## 2014-06-17 DIAGNOSIS — N183 Chronic kidney disease, stage 3 unspecified: Secondary | ICD-10-CM

## 2014-06-17 DIAGNOSIS — I158 Other secondary hypertension: Secondary | ICD-10-CM

## 2014-06-17 DIAGNOSIS — I50810 Right heart failure, unspecified: Secondary | ICD-10-CM

## 2014-06-17 DIAGNOSIS — I159 Secondary hypertension, unspecified: Secondary | ICD-10-CM

## 2014-06-17 DIAGNOSIS — Z95811 Presence of heart assist device: Secondary | ICD-10-CM | POA: Diagnosis present

## 2014-06-17 DIAGNOSIS — I5023 Acute on chronic systolic (congestive) heart failure: Secondary | ICD-10-CM

## 2014-06-17 DIAGNOSIS — I4891 Unspecified atrial fibrillation: Secondary | ICD-10-CM

## 2014-06-17 LAB — PROTIME-INR
INR: 2.77 — ABNORMAL HIGH (ref 0.00–1.49)
Prothrombin Time: 29.3 seconds — ABNORMAL HIGH (ref 11.6–15.2)

## 2014-06-17 LAB — BASIC METABOLIC PANEL
ANION GAP: 15 (ref 5–15)
BUN: 30 mg/dL — ABNORMAL HIGH (ref 6–23)
CALCIUM: 8.9 mg/dL (ref 8.4–10.5)
CO2: 22 mEq/L (ref 19–32)
Chloride: 102 mEq/L (ref 96–112)
Creatinine, Ser: 1.31 mg/dL (ref 0.50–1.35)
GFR calc non Af Amer: 52 mL/min — ABNORMAL LOW (ref >90)
GFR, EST AFRICAN AMERICAN: 60 mL/min — AB (ref >90)
Glucose, Bld: 99 mg/dL (ref 70–99)
Potassium: 4.2 mEq/L (ref 3.7–5.3)
Sodium: 139 mEq/L (ref 137–147)

## 2014-06-17 LAB — CBC
HCT: 35.8 % — ABNORMAL LOW (ref 39.0–52.0)
Hemoglobin: 11.7 g/dL — ABNORMAL LOW (ref 13.0–17.0)
MCH: 31.4 pg (ref 26.0–34.0)
MCHC: 32.7 g/dL (ref 30.0–36.0)
MCV: 96 fL (ref 78.0–100.0)
PLATELETS: 185 10*3/uL (ref 150–400)
RBC: 3.73 MIL/uL — ABNORMAL LOW (ref 4.22–5.81)
RDW: 16.6 % — AB (ref 11.5–15.5)
WBC: 6.3 10*3/uL (ref 4.0–10.5)

## 2014-06-17 LAB — LACTATE DEHYDROGENASE: LDH: 332 U/L — ABNORMAL HIGH (ref 94–250)

## 2014-06-17 LAB — PRO B NATRIURETIC PEPTIDE: Pro B Natriuretic peptide (BNP): 2606 pg/mL — ABNORMAL HIGH (ref 0–125)

## 2014-06-17 MED ORDER — RAMIPRIL 2.5 MG PO CAPS: 2.5000 mg | ORAL_CAPSULE | Freq: Every day | ORAL | Status: DC

## 2014-06-17 NOTE — Progress Notes (Signed)
Patient ID: [name], male   DOB: [birth year], 73 y.o.   MRN: [id number]  PCP: Dr. Karolee Stamps ([place])   [name] is a 73 yo male with h/o VT, PAD and severe systolic HF (EF 29-93%) due to mixed ischemic/nonischemic CM he is s/p HM IILVAD implant for destination therapy on October 19, 2013. His course was complicated by VT, syncope, A fib/Aflutter and persistent low output felt to be due to RHF. RHC showed low cardiac output but no evidence of RV failure on Milrinone. He was felt to be volume depleted. He was discharged home on 11/18/13 on mirlinone and PRN lasix 20 mg for a weight of 200 pounds or greater. Milrinone has since been weaned off.   Follow up for Heart Failure/LVAD: Underwent DC-CV 06/04/14 which was successful and was started on amiodarone 400 mg BID. Doing well. Denies SOB, orthopnea, PND or CP. Gets minimal SOB when it is hot and muggy. Weight at home has been stable 189-195 lbs. Takes lasix 20 mg on M/W/F. +bilateral LE edema. Walking about 2 1/2 miles a day. Compliant with medications. Following a low salt diet and drinking less than 2L a day. 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   Denies LVAD alarms.  Denies driveline trauma, erythema or drainage.  Denies ICD shocks.    Reports taking Coumadin as prescribed and adherence to anticoagulation based dietary restrictions.  Denies bright red blood per rectum or melena, no dark urine or hematuria.     Past Medical History  Diagnosis Date  . CAD (coronary artery disease)     s/p MI 1982;  s/p DES to CFX 2011;  s/p NSTEMI 4/12 in setting of VTach;  cath 7/12:  LM ok, LAD mid 30-40%, Dx's with 40%, mCFX stent patent, prox to mid RCA occluded with R to R and L to R collats.  Medical Tx was continued  . Bradycardia   . Ventricular tachycardia     s/p AICD;   h/o ICD shock;  Amiodarone Rx. S/P ablation in 2012  . PVD (peripheral vascular disease)     h/o claudication;  s/p R fem to pop BPG 3/11;  s/p L  CFA BPG 7/03;  s/p repair of inf AAA 6/03;  s/p aorta to bilat renal BPG 6/03  . HTN (hypertension)   . HLD (hyperlipidemia)   . Cytopenia   . Hypothyroidism   . Renal insufficiency   . Claudication   . Chronic systolic heart failure   . Ischemic cardiomyopathy     echo 7/12:  EF 25-30%, mild AI, mod MR, mod LAE  . TIA (transient ischemic attack) 2001  . CVD (cerebrovascular disease)     left CEA 2008  . Stroke   . Anemia   . Inappropriate therapy from implantable cardioverter-defibrillator 04/02/2013    ATP for sinus tach  SVT wavelet identified SVT but was no  passive   . Myocardial infarction 1992  . Anginal pain   . Automatic implantable cardioverter-defibrillator in situ     Medtronic Evera device serial number [id number]    . Sleep apnea     denies wearing mask (05/25/2013)  . Arthritis     "a little in my knees" (05/25/2013)  . Melanoma of ear 2013    "near left ear" (05/25/2013)  . 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 CHF (congestive heart failure)     Current Outpatient Prescriptions  Medication Sig Dispense Refill  .  amiodarone (PACERONE) 200 MG tablet Take 2 tablets (400 mg total) by mouth 2 (two) times daily.  120 tablet  2  . amLODipine (NORVASC) 10 MG tablet Take one tab daily  60 tablet  6  . aspirin 81 MG tablet Take 81 mg by mouth daily.       [name] atorvastatin (LIPITOR) 80 MG tablet Take 80 mg by mouth daily.       [name] CALCIUM-MAGNESIUM-ZINC PO Take 3 tablets by mouth at bedtime. 1000 mg calcium, 400 mg magnesium, 25 mg zinc      . Docusate Sodium (DSS) 100 MG CAPS Take 200 mg by mouth daily as needed.  60 each  6  . ferrous gluconate (FERGON) 324 MG tablet Take 1 tablet (324 mg total) by mouth 3 (three) times daily with meals.  90 tablet  6  . furosemide (LASIX) 20 MG tablet Take 20 mg by mouth every other day.       . levothyroxine (SYNTHROID) 125 MCG tablet Take 1 tablet (125 mcg total) by mouth daily.  30 tablet  11  . Multiple Vitamin (MULTIVITAMIN) tablet  Take 1 tablet by mouth daily.       . Multiple Vitamins-Minerals (EYE VITAMINS PO) Take 1 capsule by mouth 2 (two) times daily.       . pantoprazole (PROTONIX) 40 MG tablet Take 1 tablet (40 mg total) by mouth daily.  30 tablet  6  . ranolazine (RANEXA) 500 MG 12 hr tablet Take 1 tablet (500 mg total) by mouth 2 (two) times daily.  60 tablet  6  . warfarin (COUMADIN) 2 MG tablet Take 2-4 mg by mouth daily. Take 2 tablets (4 mg) on Monday, Wednesday and Friday, take 1 tablet (2 mg) on Sunday, Tuesday, Thursday and Saturday      . nitroGLYCERIN (NITROSTAT) 0.4 MG SL tablet Place 0.4 mg under the tongue every 5 (five) minutes as needed for chest pain. For chest pain       No current facility-administered medications for this encounter.    Demerol; Meperidine hcl; and Opium  REVIEW OF SYSTEMS: All systems negative except as listed in HPI, PMH and Problem list.  LVAD INTERROGATION:   HeartMate II LVAD:   Speed: 9000 Flow 4.4 liters/min Power 4.8 PI 7.8 Alarms: none Events: minimal PI events most days none Fixed speed: 9000 Low speed limit:  8400  I reviewed the LVAD parameters from today, and compared the results to the patient's prior recorded data.  Increased set speed to 9200 and back-up speed to 8600. The LVAD is functioning within specified parameters.  The patient performs LVAD self-test daily.  LVAD interrogation was negative for any significant power changes, alarms or PI events/speed drops.  LVAD equipment check completed and is in good working order.  Back-up equipment present.   LVAD education done on emergency procedures and precautions and reviewed exit site care.    Filed Vitals:   06/17/14 1004  BP: 104/0  Pulse: 75  Height: 6\' 1"  (1.854 m)  Weight: 199 lb 6.4 oz (90.447 kg)  SpO2: 98%    Physical Exam: GENERAL: Well appearing, male who presents to clinic today in no acute distress. Wife present HEENT: normal  NECK: Supple, JVP 8-9 .  2+ bilaterally, no bruits.  No  lymphadenopathy or thyromegaly appreciated.   CARDIAC:  Mechanical heart sounds with LVAD hum present.  LUNGS:  Clear to auscultation bilaterally.  ABDOMEN:  Soft, round, nontender, positive bowel sounds x4.  LVAD exit site: well-healed and incorporated.  Dressing dry and intact.  No erythema or drainage.  Stabilization device present and accurately applied.  Driveline dressing is being changed weekly per sterile technique. EXTREMITIES:  Warm and dry, no cyanosis, clubbing, bilateral 2-3+ edema R>L NEUROLOGIC:  Alert and oriented x 4.  Gait steady.  No aphasia.  No dysarthria.  Affect pleasant.     EKG: NSR 75 bpm  ASSESSMENT AND PLAN:   1. Chronic systolic HF: s/p LVAD 79/8921 - NYHA II symptoms and volume status increased slightly elevated. Patient currently taking lasix 20 mg QOD. Will have patient take 20 mg today. Discussed the use of sliding scale diuretics. - He is not on a BB with history of RV failure. - MAP slightly elevated will check BMET today and is stable will start ramipril 2.5 mg daily. - Reinforced the need and importance of daily weights, a low sodium diet, and fluid restriction (less than 2 L a day). Instructed to call the HF clinic if weight increases more than 3 lbs overnight or 5 lbs in a week.  2. Afib/Flutter - s/p DC-CV 06/04/14. He remains in NSR. Optivol interrogated and showed small bit of possible Afib on August 4th. Patient activity has increased. Will continue amiodarone 400 mg BID for one more week and then decrease to 400 mg daily. Last TSH and T4 stable. 2) HTN - Mildly elevated. As above if creatinine stable will start back ramipril 2.5 mg daily and check BMET in 7-10 days. 3) LVAD - LVAD parameters stable. He is having 3-5 PI events a day if that. Much improved from when he was in afib and having 50-60 PI events a day.  - Will increase speed to 9200 and back-up speed to 8600. Speed was set at 9400 but while in afib it was decreased. If remains stable can  try increasing back to 9400 next visit. Hoping that speed will help with volume as well. - Check BMET, LDH, INR, CBC and pro-BNP  4) CKD, stage III - Check BMET today.  5) Anticoagulation management: - Check INR today and will address accordingly  F/U 6 weeks. Junie Bame B  NP-C 10:11 AM

## 2014-06-17 NOTE — Progress Notes (Signed)
   Symptom  Yes  No  Details   Angina       x Activity:   Claudication        x How far:   Syncope        x When:     Stroke        x        TIA  2001  Orthopnea         x How many pillows: 1  PND         x How often:  CPAP       N/A How many hrs:   Pedal edema        x  Daily; not taking any extra lasix   Abd fullness               x    N&V        x        Today and Tues  Diaphoresis               x   Bleeding              x   Urine color        light yellow  SOB       x   Activity:  Outside in hot weather  Palpitations        x When:  ICD shock        x   Hospitlizaitons             x When/where/why:   ED visit        x When/where/why:  Other MD              x When/who/why:   Activity        Working 5 hrs/5 days week; walking 2.5 miles day  Fluid    No limitations  < 2000   Diet    Low salt food   Vital signs: HR: 75 MAP BP:  104; 110 O2 Sat:  98 Wt:  199.4 bs Last wt: 197.8  lbs Ht: 6'  LVAD interrogation reveals:  Speed:   9000 RPM Flow:  4.5 Power:   4.9 PI:  8.0 Alarms:  none Events:  4 PI today; none Fixed speed:  9000 Low speed limit:  8400   VAD dressing removed and site care performed using sterile technique. Drive line exit site cleaned with Chlora prep applicators x 2, allowed to dry, and Sorbvaview dressing with Biopatch re-applied. Drive line incorporated, the velour is fully implanted at exit site. No erythema, tenderness, drainage, or foul odor noted. Drive line anchor in place. Pt denies fever or chills. Driveline dressing is being changed every week per sterile technique using Sorbaview dressing.   Pt/caregiver deny any alarms or VAD equipment issues. VAD coordinator reviewed daily log from home for daily temperature, weight, and VAD parameters. Pt is performing daily controller and system monitor self tests along with completing weekly and monthly maintenance for LVAD equipment.  LVAD equipment check completed and is in good working order. Back-up  equipment present. Emergency procedures and precautions reviewed.  Primary and secondary controllers programmed:  Fixed speed:  9200 RPM  Low speed limit:  8600 RPM  EKG and Optivol performed.

## 2014-06-17 NOTE — Patient Instructions (Addendum)
1.  Continue Amiodarone 400 mg twice daily for one week then 400 mg daily 2.  Take extra Lasix 20 mg today 3.  No change in coumadin dose; re-check INR and BMP on 07/01/14 4.  Start Ramipril 2.5 mg daily 4.  Return to [place] clinic in 6 weeks

## 2014-06-18 NOTE — Addendum Note (Signed)
Encounter addended by: Evalee Mutton, CCT on: 06/18/2014  8:39 AM<BR>     Documentation filed: Charges VN

## 2014-06-19 NOTE — Addendum Note (Signed)
Encounter addended by: Jolaine Artist, MD on: 06/19/2014  9:19 PM<BR>     Documentation filed: Charges VN, Follow-up Section, LOS Section, Visit Diagnoses, Notes Section

## 2014-06-20 ENCOUNTER — Other Ambulatory Visit (HOSPITAL_COMMUNITY): Payer: Self-pay | Admitting: Adult Health

## 2014-06-30 ENCOUNTER — Encounter: Payer: Self-pay | Admitting: Internal Medicine

## 2014-07-01 ENCOUNTER — Other Ambulatory Visit (HOSPITAL_COMMUNITY): Payer: Self-pay | Admitting: Internal Medicine

## 2014-07-01 ENCOUNTER — Ambulatory Visit (INDEPENDENT_AMBULATORY_CARE_PROVIDER_SITE_OTHER): Payer: Medicare Other | Admitting: *Deleted

## 2014-07-01 DIAGNOSIS — I472 Ventricular tachycardia, unspecified: Secondary | ICD-10-CM

## 2014-07-01 DIAGNOSIS — I4729 Other ventricular tachycardia: Secondary | ICD-10-CM

## 2014-07-01 LAB — MDC_IDC_ENUM_SESS_TYPE_REMOTE
Battery Voltage: 3.01 V
Brady Statistic AP VP Percent: 0.24 %
Brady Statistic AP VS Percent: 96.79 %
Brady Statistic AS VP Percent: 0.01 %
Brady Statistic RA Percent Paced: 97.04 %
Brady Statistic RV Percent Paced: 0.26 %
HighPow Impedance: 190 Ohm
HighPow Impedance: 34 Ohm
HighPow Impedance: 41 Ohm
Lead Channel Impedance Value: 608 Ohm
Lead Channel Pacing Threshold Amplitude: 0.875 V
Lead Channel Pacing Threshold Pulse Width: 0.4 ms
Lead Channel Pacing Threshold Pulse Width: 0.4 ms
Lead Channel Sensing Intrinsic Amplitude: 0.875 mV
Lead Channel Sensing Intrinsic Amplitude: 2.125 mV
Lead Channel Setting Pacing Amplitude: 2 V
Lead Channel Setting Pacing Pulse Width: 0.4 ms
Lead Channel Setting Sensing Sensitivity: 0.3 mV
MDC IDC MSMT BATTERY REMAINING LONGEVITY: 106 mo
MDC IDC MSMT LEADCHNL RA IMPEDANCE VALUE: 399 Ohm
MDC IDC MSMT LEADCHNL RA PACING THRESHOLD AMPLITUDE: 1 V
MDC IDC MSMT LEADCHNL RA SENSING INTR AMPL: 2.125 mV
MDC IDC MSMT LEADCHNL RV SENSING INTR AMPL: 0.875 mV
MDC IDC SESS DTM: 20150827113124
MDC IDC SET LEADCHNL RV PACING AMPLITUDE: 2 V
MDC IDC SET ZONE DETECTION INTERVAL: 330 ms
MDC IDC SET ZONE DETECTION INTERVAL: 540 ms
MDC IDC STAT BRADY AS VS PERCENT: 2.95 %
Zone Setting Detection Interval: 300 ms
Zone Setting Detection Interval: 370 ms
Zone Setting Detection Interval: 430 ms

## 2014-07-01 LAB — PROTIME-INR: INR: 2.1 — AB (ref <=1.1)

## 2014-07-01 NOTE — Progress Notes (Signed)
Remote ICD transmission.   

## 2014-07-02 ENCOUNTER — Ambulatory Visit (HOSPITAL_COMMUNITY): Payer: Self-pay | Admitting: Anesthesiology

## 2014-07-02 ENCOUNTER — Telehealth (HOSPITAL_COMMUNITY): Payer: Self-pay | Admitting: Anesthesiology

## 2014-07-02 LAB — PROTIME-INR
INR: 2.1 — AB (ref 0.8–1.2)
Prothrombin Time: 21.6 s — ABNORMAL HIGH (ref 9.1–12.0)

## 2014-07-02 LAB — BASIC METABOLIC PANEL
BUN/Creatinine Ratio: 17 (ref 10–22)
BUN: 38 mg/dL — AB (ref 8–27)
CALCIUM: 9 mg/dL (ref 8.6–10.2)
CO2: 23 mmol/L (ref 18–29)
Chloride: 97 mmol/L (ref 97–108)
Creatinine, Ser: 2.2 mg/dL — ABNORMAL HIGH (ref 0.76–1.27)
GFR calc Af Amer: 33 mL/min/{1.73_m2} — ABNORMAL LOW (ref >59)
GFR calc non Af Amer: 28 mL/min/{1.73_m2} — ABNORMAL LOW (ref >59)
GLUCOSE: 91 mg/dL (ref 65–99)
Potassium: 4.6 mmol/L (ref 3.5–5.2)
Sodium: 139 mmol/L (ref 134–144)

## 2014-07-02 NOTE — Telephone Encounter (Signed)
Called patient about lab results and INR therapeutic 2.1 but creatinine elevated to 2.2 from 1.3. Told to stop ramipril. Repeat BMET and INR next week.   Concerned he maybe in Afib again and reports he sent transmission yesterday. Will call to see if I can get results.  Junie Bame B NP-C 9:57 AM

## 2014-07-05 ENCOUNTER — Telehealth (HOSPITAL_COMMUNITY): Payer: Self-pay | Admitting: *Deleted

## 2014-07-05 NOTE — Telephone Encounter (Signed)
[name] called to ask if ICD transmission showed any afib; per Junie Bame, NP reported to her that pt is still in sinus rhythm. Confirmed pt stopped Ramipril, will re-check BMP Wednesday 07/07/14; order sent to local [facility] for BMP. [name] verbalized understanding of same.

## 2014-07-07 ENCOUNTER — Other Ambulatory Visit (HOSPITAL_COMMUNITY): Payer: Self-pay | Admitting: Internal Medicine

## 2014-07-07 LAB — PROTIME-INR: INR: 2.5 — AB (ref <=1.1)

## 2014-07-08 ENCOUNTER — Telehealth (HOSPITAL_COMMUNITY): Payer: Self-pay | Admitting: *Deleted

## 2014-07-08 ENCOUNTER — Other Ambulatory Visit (HOSPITAL_COMMUNITY): Payer: Self-pay | Admitting: Adult Health

## 2014-07-08 ENCOUNTER — Ambulatory Visit (HOSPITAL_COMMUNITY): Payer: Self-pay | Admitting: *Deleted

## 2014-07-08 DIAGNOSIS — I5022 Chronic systolic (congestive) heart failure: Secondary | ICD-10-CM

## 2014-07-08 LAB — PROTIME-INR
INR: 2.5 — ABNORMAL HIGH (ref 0.8–1.2)
Prothrombin Time: 25.8 s — ABNORMAL HIGH (ref 9.1–12.0)

## 2014-07-08 LAB — BASIC METABOLIC PANEL
BUN / CREAT RATIO: 15 (ref 10–22)
BUN: 33 mg/dL — ABNORMAL HIGH (ref 8–27)
CHLORIDE: 97 mmol/L (ref 97–108)
CO2: 24 mmol/L (ref 18–29)
Calcium: 9.2 mg/dL (ref 8.6–10.2)
Creatinine, Ser: 2.15 mg/dL — ABNORMAL HIGH (ref 0.76–1.27)
GFR calc Af Amer: 34 mL/min/{1.73_m2} — ABNORMAL LOW (ref >59)
GFR calc non Af Amer: 29 mL/min/{1.73_m2} — ABNORMAL LOW (ref >59)
GLUCOSE: 106 mg/dL — AB (ref 65–99)
Potassium: 4.6 mmol/L (ref 3.5–5.2)
SODIUM: 138 mmol/L (ref 134–144)

## 2014-07-08 LAB — AMBIG ABBREV BMP8 DEFAULT

## 2014-07-08 NOTE — Telephone Encounter (Signed)
Called [name] re: lab results from yesterday (received today). Creat remains elevated since stopping Ramipril. Per Dr. Haroldine Laws - pt is to stop lasix; come for [facility] Tuesday 07/13/14. He is to check in admissions at 8am; NPO after MN; take am meds with sip of H2O; RHC scheduled for 10:00am. Explained patient may need admission following RHC for PICC and milrinone. [name] verbalized understanding of same.

## 2014-07-09 ENCOUNTER — Telehealth (HOSPITAL_COMMUNITY): Payer: Self-pay | Admitting: Cardiology

## 2014-07-09 ENCOUNTER — Encounter (HOSPITAL_COMMUNITY): Payer: Self-pay | Admitting: Pharmacy Technician

## 2014-07-09 NOTE — Telephone Encounter (Signed)
Pt scheduled for RHC on 07/13/14 Cpt [id number] icd 9- 428.22 With pts current insurance- [employer]- no pre cert req'd

## 2014-07-13 ENCOUNTER — Encounter (HOSPITAL_COMMUNITY)
Admission: RE | Disposition: A | Discharge status: Home / Self Care | Payer: Self-pay | Source: Ambulatory Visit | Attending: Internal Medicine

## 2014-07-13 ENCOUNTER — Ambulatory Visit (HOSPITAL_COMMUNITY)
Admission: RE | Admit: 2014-07-13 | Discharge: 2014-07-13 | Disposition: A | Discharge status: Home / Self Care | Payer: Medicare Other | Source: Ambulatory Visit | Attending: Internal Medicine | Admitting: Internal Medicine

## 2014-07-13 ENCOUNTER — Ambulatory Visit (HOSPITAL_COMMUNITY): Payer: Self-pay | Admitting: *Deleted

## 2014-07-13 DIAGNOSIS — I509 Heart failure, unspecified: Secondary | ICD-10-CM

## 2014-07-13 DIAGNOSIS — Z9861 Coronary angioplasty status: Secondary | ICD-10-CM | POA: Insufficient documentation

## 2014-07-13 DIAGNOSIS — I428 Other cardiomyopathies: Secondary | ICD-10-CM | POA: Insufficient documentation

## 2014-07-13 DIAGNOSIS — Z7982 Long term (current) use of aspirin: Secondary | ICD-10-CM | POA: Diagnosis not present

## 2014-07-13 DIAGNOSIS — N183 Chronic kidney disease, stage 3 unspecified: Secondary | ICD-10-CM | POA: Diagnosis not present

## 2014-07-13 DIAGNOSIS — I4892 Unspecified atrial flutter: Secondary | ICD-10-CM | POA: Insufficient documentation

## 2014-07-13 DIAGNOSIS — G4730 Sleep apnea, unspecified: Secondary | ICD-10-CM | POA: Diagnosis not present

## 2014-07-13 DIAGNOSIS — Z7901 Long term (current) use of anticoagulants: Secondary | ICD-10-CM | POA: Insufficient documentation

## 2014-07-13 DIAGNOSIS — I5022 Chronic systolic (congestive) heart failure: Secondary | ICD-10-CM | POA: Insufficient documentation

## 2014-07-13 DIAGNOSIS — Z85820 Personal history of malignant melanoma of skin: Secondary | ICD-10-CM | POA: Diagnosis not present

## 2014-07-13 DIAGNOSIS — N19 Unspecified kidney failure: Secondary | ICD-10-CM

## 2014-07-13 DIAGNOSIS — I129 Hypertensive chronic kidney disease with stage 1 through stage 4 chronic kidney disease, or unspecified chronic kidney disease: Secondary | ICD-10-CM | POA: Insufficient documentation

## 2014-07-13 DIAGNOSIS — I70219 Atherosclerosis of native arteries of extremities with intermittent claudication, unspecified extremity: Secondary | ICD-10-CM | POA: Insufficient documentation

## 2014-07-13 DIAGNOSIS — I4891 Unspecified atrial fibrillation: Secondary | ICD-10-CM | POA: Diagnosis not present

## 2014-07-13 DIAGNOSIS — I2510 Atherosclerotic heart disease of native coronary artery without angina pectoris: Secondary | ICD-10-CM | POA: Insufficient documentation

## 2014-07-13 DIAGNOSIS — Z8673 Personal history of transient ischemic attack (TIA), and cerebral infarction without residual deficits: Secondary | ICD-10-CM | POA: Diagnosis not present

## 2014-07-13 DIAGNOSIS — E039 Hypothyroidism, unspecified: Secondary | ICD-10-CM | POA: Diagnosis not present

## 2014-07-13 DIAGNOSIS — I252 Old myocardial infarction: Secondary | ICD-10-CM | POA: Insufficient documentation

## 2014-07-13 DIAGNOSIS — E785 Hyperlipidemia, unspecified: Secondary | ICD-10-CM | POA: Insufficient documentation

## 2014-07-13 DIAGNOSIS — N289 Disorder of kidney and ureter, unspecified: Secondary | ICD-10-CM | POA: Diagnosis not present

## 2014-07-13 HISTORY — PX: RIGHT HEART CATHETERIZATION: SHX5447

## 2014-07-13 LAB — BASIC METABOLIC PANEL
Anion gap: 13 (ref 5–15)
BUN: 25 mg/dL — ABNORMAL HIGH (ref 6–23)
CO2: 23 meq/L (ref 19–32)
Calcium: 8.9 mg/dL (ref 8.4–10.5)
Chloride: 101 mEq/L (ref 96–112)
Creatinine, Ser: 1.48 mg/dL — ABNORMAL HIGH (ref 0.50–1.35)
GFR calc Af Amer: 52 mL/min — ABNORMAL LOW (ref >90)
GFR calc non Af Amer: 45 mL/min — ABNORMAL LOW (ref >90)
Glucose, Bld: 102 mg/dL — ABNORMAL HIGH (ref 70–99)
POTASSIUM: 4.5 meq/L (ref 3.7–5.3)
SODIUM: 137 meq/L (ref 137–147)

## 2014-07-13 LAB — POCT I-STAT 3, VENOUS BLOOD GAS (G3P V)
Acid-base deficit: 1 mmol/L (ref 0.0–2.0)
Acid-base deficit: 1 mmol/L (ref 0.0–2.0)
BICARBONATE: 23.4 meq/L (ref 20.0–24.0)
BICARBONATE: 23.8 meq/L (ref 20.0–24.0)
O2 SAT: 61 %
O2 Saturation: 60 %
PH VEN: 7.389 — AB (ref 7.250–7.300)
PO2 VEN: 32 mmHg (ref 30.0–45.0)
PO2 VEN: 32 mmHg (ref 30.0–45.0)
TCO2: 25 mmol/L (ref 0–100)
TCO2: 25 mmol/L (ref 0–100)
pCO2, Ven: 38.8 mmHg — ABNORMAL LOW (ref 45.0–50.0)
pCO2, Ven: 39.9 mmHg — ABNORMAL LOW (ref 45.0–50.0)
pH, Ven: 7.384 — ABNORMAL HIGH (ref 7.250–7.300)

## 2014-07-13 LAB — CBC
HEMATOCRIT: 33.8 % — AB (ref 39.0–52.0)
Hemoglobin: 11.2 g/dL — ABNORMAL LOW (ref 13.0–17.0)
MCH: 31.3 pg (ref 26.0–34.0)
MCHC: 33.1 g/dL (ref 30.0–36.0)
MCV: 94.4 fL (ref 78.0–100.0)
Platelets: 151 10*3/uL (ref 150–400)
RBC: 3.58 MIL/uL — AB (ref 4.22–5.81)
RDW: 15.8 % — ABNORMAL HIGH (ref 11.5–15.5)
WBC: 7.7 10*3/uL (ref 4.0–10.5)

## 2014-07-13 LAB — PROTIME-INR
INR: 2.14 — ABNORMAL HIGH (ref 0.00–1.49)
Prothrombin Time: 23.9 seconds — ABNORMAL HIGH (ref 11.6–15.2)

## 2014-07-13 SURGERY — RIGHT HEART CATH
Anesthesia: LOCAL

## 2014-07-13 MED ORDER — FENTANYL CITRATE 0.05 MG/ML IJ SOLN
INTRAMUSCULAR | Status: AC
  Filled 2014-07-13: qty 2

## 2014-07-13 MED ORDER — NITROGLYCERIN 1 MG/10 ML FOR IR/CATH LAB
INTRA_ARTERIAL | Status: AC
  Filled 2014-07-13: qty 10

## 2014-07-13 MED ORDER — SODIUM CHLORIDE 0.9 % IV SOLN
250.0000 mL | INTRAVENOUS | Status: DC | PRN
Start: 2014-07-13 — End: 2014-07-13

## 2014-07-13 MED ORDER — SODIUM CHLORIDE 0.9 % IJ SOLN: 3.0000 mL | Freq: Two times a day (BID) | INTRAMUSCULAR | Status: DC

## 2014-07-13 MED ORDER — ASPIRIN 81 MG PO CHEW: 81.0000 mg | CHEWABLE_TABLET | ORAL | Status: DC

## 2014-07-13 MED ORDER — MIDAZOLAM HCL 2 MG/2ML IJ SOLN
INTRAMUSCULAR | Status: AC
  Filled 2014-07-13: qty 2

## 2014-07-13 MED ORDER — SODIUM CHLORIDE 0.9 % IJ SOLN: 3.0000 mL | INTRAMUSCULAR | Status: DC | PRN

## 2014-07-13 MED ORDER — HEPARIN (PORCINE) IN NACL 2-0.9 UNIT/ML-% IJ SOLN
INTRAMUSCULAR | Status: AC
  Filled 2014-07-13: qty 500

## 2014-07-13 MED ORDER — SODIUM CHLORIDE 0.9 % IV SOLN
INTRAVENOUS | Status: DC
  Administered 2014-07-13: 09:00:00 via INTRAVENOUS

## 2014-07-13 MED ORDER — SODIUM CHLORIDE 0.9 % IV SOLN: 250.0000 mL | INTRAVENOUS | Status: DC | PRN

## 2014-07-13 MED ORDER — LIDOCAINE HCL (PF) 1 % IJ SOLN
INTRAMUSCULAR | Status: AC
  Filled 2014-07-13: qty 30

## 2014-07-13 NOTE — Progress Notes (Signed)
Site area: right Internal Jugular  Site Prior to Removal:  Level 0  Pressure Applied For 15 MINUTES    Minutes Beginning at 1130am  Manual:   Yes.    Patient Status During Pull:  stable  Post Pull Groin Site:  Level 0  Post Pull Instructions Given:  Yes.    Post Pull Pulses Present:  Yes.    Dressing Applied:  Yes.    Comments:  Pt remain stable during sheath pull.

## 2014-07-13 NOTE — Progress Notes (Signed)
VAD coordinator paged to Cath Lab for RHC procedure with following parameters:     MAP:  Flow:  PI:  Power:  Speed: Pre procedure  10:25 am 110  7.8  7.2  5.4  9200  During procedure  10:40  102  4.9  6.5  5.4  9200  10:45  100  4.7  6.3  5.4  9200  10:55  86  4.7  5.5  5.4  9200  Post procedure  11:00  94  4.8  6.3  5.4  9200  Planned renal artery duplex post RHC; pt will maintain NPO status. Reported to cath lab holding room nurse. Vascular lab will call for patient when time available (pt will be a work in).  If any questions, please call the VAD pager: [phone]

## 2014-07-13 NOTE — Progress Notes (Signed)
Discharged home with wife; Right IJ dressing CDI; denies pain/discomfort

## 2014-07-13 NOTE — Progress Notes (Addendum)
*  PRELIMINARY RESULTS* Vascular Ultrasound Renal Artery Duplex has been completed.  Preliminary findings: Technically limited and difficult study due to patient VAD location and overlying bowel gas. Bilateral kidneys were poorly visualized. Multiple large renal cysts noted bilaterally. Due to VAD, difficult to determine arterial vs venous flow at kidneys. Cannot visualize renal arteries in 2D to access plaque morphology. Unable to determine presence of stenosis.   Landry Mellow, RDMS, RVT  07/13/2014, 2:10 PM

## 2014-07-13 NOTE — Interval H&P Note (Signed)
History and Physical Interval Note:  07/13/2014 9:58 AM  [name]  has presented today for surgery, with the diagnosis of heart failure  The various methods of treatment have been discussed with the patient and family. After consideration of risks, benefits and other options for treatment, the patient has consented to  Procedure(s): RIGHT HEART CATH (N/A) as a surgical intervention .  The patient's history has been reviewed, patient examined, no change in status, stable for surgery.  I have reviewed the patient's chart and labs.  Questions were answered to the patient's satisfaction.    Patient has been struggling with RHF and worsening renal function. Needs RHC to assess need for inotropic support.    Glori Bickers MD

## 2014-07-13 NOTE — Discharge Instructions (Signed)
Wound Care Wound care helps prevent pain and infection.   HOME CARE   Only take medicine as told by your doctor.  Clean the wound daily with mild soap and water.  Change any bandages (dressings) as told by your doctor.  Change the bandage if it gets wet, dirty, or starts to smell.  Take showers. Do not take baths, swim, or do anything that puts your wound under water.  Rest and raise (elevate) the wound until the pain and puffiness (swelling) are better.  Keep all doctor visits as told. GET HELP RIGHT AWAY IF:   Yellowish-white fluid (pus) comes from the wound.  Medicine does not lessen your pain.  There is a red streak going away from the wound.  You have a fever. MAKE SURE YOU:   Understand these instructions.  Will watch your condition.  Will get help right away if you are not doing well or get worse. Document Released: 07/31/2008 Document Revised: 01/14/2012 Document Reviewed: 2020-03-1411 [facility] Patient Information 2015 [place], [place]. This information is not intended to replace advice given to you by your health care provider. Make sure you discuss any questions you have with your health care provider.

## 2014-07-13 NOTE — CV Procedure (Signed)
Cardiac Cath Procedure Note:  Indication:  HF with worsening renal function  Procedures performed:  1) Right heart catheterization  Description of procedure:   The risks and indication of the procedure were explained. Consent was signed and placed on the chart. An appropriate timeout was taken prior to the procedure. The right neck was prepped and draped in the routine sterile fashion and anesthetized with 1% local lidocaine.   A 7 FR venous sheath was placed in the right internal jugular vein using a modified Seldinger technique and u/s guidance. A standard Swan-Ganz catheter was used for the procedure.   Complications: None apparent.  Findings:  RA = 7 RV = 25/4/8 PA = 27/15 (19) PCW = 8 Fick cardiac output/index = 6.4/3.0 Thermo CO/CI = 6.1/2.9 PVR = 1.7 WU FA sat = 90% PA sat = 60%, 61%  Assessment: 1. Well compensated hemodynamics with VAD support  Plan/Discussion:  His numbers look good. Would continue to hold diuretics and use just PRN. Will get renal artery u/s to look for RAS to further evaluate rise in creatinine.   Frank Matus,MD 11:05 AM

## 2014-07-13 NOTE — H&P (View-Only) (Signed)
Patient ID: [name], male   DOB: 12-Aug-1941, 73 y.o.   MRN: [id number]  PCP: Dr. Karolee Stamps ([place])   [name] is a 73 yo male with h/o VT, PAD and severe systolic HF (EF 57-84%) due to mixed ischemic/nonischemic CM he is s/p HM IILVAD implant for destination therapy on October 19, 2013. His course was complicated by VT, syncope, A fib/Aflutter and persistent low output felt to be due to RHF. RHC showed low cardiac output but no evidence of RV failure on Milrinone. He was felt to be volume depleted. He was discharged home on 11/18/13 on mirlinone and PRN lasix 20 mg for a weight of 200 pounds or greater. Milrinone has since been weaned off.   Follow up for Heart Failure/LVAD: Underwent DC-CV 06/04/14 which was successful and was started on amiodarone 400 mg BID. Doing well. Denies SOB, orthopnea, PND or CP. Gets minimal SOB when it is hot and muggy. Weight at home has been stable 189-195 lbs. Takes lasix 20 mg on M/W/F. +bilateral LE edema. Walking about 2 1/2 miles a day. Compliant with medications. Following a low salt diet and drinking less than 2L a day. 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   Denies LVAD alarms.  Denies driveline trauma, erythema or drainage.  Denies ICD shocks.    Reports taking Coumadin as prescribed and adherence to anticoagulation based dietary restrictions.  Denies bright red blood per rectum or melena, no dark urine or hematuria.     Past Medical History  Diagnosis Date  . CAD (coronary artery disease)     s/p MI 1982;  s/p DES to CFX 2011;  s/p NSTEMI 4/12 in setting of VTach;  cath 7/12:  LM ok, LAD mid 30-40%, Dx's with 40%, mCFX stent patent, prox to mid RCA occluded with R to R and L to R collats.  Medical Tx was continued  . Bradycardia   . Ventricular tachycardia     s/p AICD;   h/o ICD shock;  Amiodarone Rx. S/P ablation in 2012  . PVD (peripheral vascular disease)     h/o claudication;  s/p R fem to pop BPG 3/11;  s/p L  CFA BPG 7/03;  s/p repair of inf AAA 6/03;  s/p aorta to bilat renal BPG 6/03  . HTN (hypertension)   . HLD (hyperlipidemia)   . Cytopenia   . Hypothyroidism   . Renal insufficiency   . Claudication   . Chronic systolic heart failure   . Ischemic cardiomyopathy     echo 7/12:  EF 25-30%, mild AI, mod MR, mod LAE  . TIA (transient ischemic attack) 2001  . CVD (cerebrovascular disease)     left CEA 2008  . Stroke   . Anemia   . Inappropriate therapy from implantable cardioverter-defibrillator 04/02/2013    ATP for sinus tach  SVT wavelet identified SVT but was no  passive   . Myocardial infarction 1992  . Anginal pain   . Automatic implantable cardioverter-defibrillator in situ     Medtronic Evera device serial number [id number]    . Sleep apnea     denies wearing mask (05/25/2013)  . Arthritis     "a little in my knees" (05/25/2013)  . Melanoma of ear 2013    "near left ear" (05/25/2013)  . 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 CHF (congestive heart failure)     Current Outpatient Prescriptions  Medication Sig Dispense Refill  .  amiodarone (PACERONE) 200 MG tablet Take 2 tablets (400 mg total) by mouth 2 (two) times daily.  120 tablet  2  . amLODipine (NORVASC) 10 MG tablet Take one tab daily  60 tablet  6  . aspirin 81 MG tablet Take 81 mg by mouth daily.       [name] atorvastatin (LIPITOR) 80 MG tablet Take 80 mg by mouth daily.       [name] CALCIUM-MAGNESIUM-ZINC PO Take 3 tablets by mouth at bedtime. 1000 mg calcium, 400 mg magnesium, 25 mg zinc      . Docusate Sodium (DSS) 100 MG CAPS Take 200 mg by mouth daily as needed.  60 each  6  . ferrous gluconate (FERGON) 324 MG tablet Take 1 tablet (324 mg total) by mouth 3 (three) times daily with meals.  90 tablet  6  . furosemide (LASIX) 20 MG tablet Take 20 mg by mouth every other day.       . levothyroxine (SYNTHROID) 125 MCG tablet Take 1 tablet (125 mcg total) by mouth daily.  30 tablet  11  . Multiple Vitamin (MULTIVITAMIN) tablet  Take 1 tablet by mouth daily.       . Multiple Vitamins-Minerals (EYE VITAMINS PO) Take 1 capsule by mouth 2 (two) times daily.       . pantoprazole (PROTONIX) 40 MG tablet Take 1 tablet (40 mg total) by mouth daily.  30 tablet  6  . ranolazine (RANEXA) 500 MG 12 hr tablet Take 1 tablet (500 mg total) by mouth 2 (two) times daily.  60 tablet  6  . warfarin (COUMADIN) 2 MG tablet Take 2-4 mg by mouth daily. Take 2 tablets (4 mg) on Monday, Wednesday and Friday, take 1 tablet (2 mg) on Sunday, Tuesday, Thursday and Saturday      . nitroGLYCERIN (NITROSTAT) 0.4 MG SL tablet Place 0.4 mg under the tongue every 5 (five) minutes as needed for chest pain. For chest pain       No current facility-administered medications for this encounter.    Demerol; Meperidine hcl; and Opium  REVIEW OF SYSTEMS: All systems negative except as listed in HPI, PMH and Problem list.  LVAD INTERROGATION:   HeartMate II LVAD:   Speed: 9000 Flow 4.4 liters/min Power 4.8 PI 7.8 Alarms: none Events: minimal PI events most days none Fixed speed: 9000 Low speed limit:  8400  I reviewed the LVAD parameters from today, and compared the results to the patient's prior recorded data.  Increased set speed to 9200 and back-up speed to 8600. The LVAD is functioning within specified parameters.  The patient performs LVAD self-test daily.  LVAD interrogation was negative for any significant power changes, alarms or PI events/speed drops.  LVAD equipment check completed and is in good working order.  Back-up equipment present.   LVAD education done on emergency procedures and precautions and reviewed exit site care.    Filed Vitals:   06/17/14 1004  BP: 104/0  Pulse: 75  Height: 6\' 1"  (1.854 m)  Weight: 199 lb 6.4 oz (90.447 kg)  SpO2: 98%    Physical Exam: GENERAL: Well appearing, male who presents to clinic today in no acute distress. Wife present HEENT: normal  NECK: Supple, JVP 8-9 .  2+ bilaterally, no bruits.  No  lymphadenopathy or thyromegaly appreciated.   CARDIAC:  Mechanical heart sounds with LVAD hum present.  LUNGS:  Clear to auscultation bilaterally.  ABDOMEN:  Soft, round, nontender, positive bowel sounds x4.  LVAD exit site: well-healed and incorporated.  Dressing dry and intact.  No erythema or drainage.  Stabilization device present and accurately applied.  Driveline dressing is being changed weekly per sterile technique. EXTREMITIES:  Warm and dry, no cyanosis, clubbing, bilateral 2-3+ edema R>L NEUROLOGIC:  Alert and oriented x 4.  Gait steady.  No aphasia.  No dysarthria.  Affect pleasant.     EKG: NSR 75 bpm  ASSESSMENT AND PLAN:   1. Chronic systolic HF: s/p LVAD 34/1937 - NYHA II symptoms and volume status increased slightly elevated. Patient currently taking lasix 20 mg QOD. Will have patient take 20 mg today. Discussed the use of sliding scale diuretics. - He is not on a BB with history of RV failure. - MAP slightly elevated will check BMET today and is stable will start ramipril 2.5 mg daily. - Reinforced the need and importance of daily weights, a low sodium diet, and fluid restriction (less than 2 L a day). Instructed to call the HF clinic if weight increases more than 3 lbs overnight or 5 lbs in a week.  2. Afib/Flutter - s/p DC-CV 06/04/14. He remains in NSR. Optivol interrogated and showed small bit of possible Afib on August 4th. Patient activity has increased. Will continue amiodarone 400 mg BID for one more week and then decrease to 400 mg daily. Last TSH and T4 stable. 2) HTN - Mildly elevated. As above if creatinine stable will start back ramipril 2.5 mg daily and check BMET in 7-10 days. 3) LVAD - LVAD parameters stable. He is having 3-5 PI events a day if that. Much improved from when he was in afib and having 50-60 PI events a day.  - Will increase speed to 9200 and back-up speed to 8600. Speed was set at 9400 but while in afib it was decreased. If remains stable can  try increasing back to 9400 next visit. Hoping that speed will help with volume as well. - Check BMET, LDH, INR, CBC and pro-BNP  4) CKD, stage III - Check BMET today.  5) Anticoagulation management: - Check INR today and will address accordingly  F/U 6 weeks. Junie Bame B  NP-C 10:11 AM

## 2014-07-15 ENCOUNTER — Encounter: Payer: Self-pay | Admitting: Cardiology

## 2014-07-22 ENCOUNTER — Encounter: Payer: Self-pay | Admitting: Internal Medicine

## 2014-07-23 ENCOUNTER — Other Ambulatory Visit (HOSPITAL_COMMUNITY): Payer: Self-pay | Admitting: Internal Medicine

## 2014-07-26 ENCOUNTER — Other Ambulatory Visit (HOSPITAL_COMMUNITY): Payer: Self-pay | Admitting: *Deleted

## 2014-07-26 DIAGNOSIS — Z95811 Presence of heart assist device: Secondary | ICD-10-CM

## 2014-07-26 DIAGNOSIS — I255 Ischemic cardiomyopathy: Secondary | ICD-10-CM

## 2014-07-26 DIAGNOSIS — I509 Heart failure, unspecified: Secondary | ICD-10-CM

## 2014-07-26 MED ORDER — ATORVASTATIN CALCIUM 80 MG PO TABS: 80.0000 mg | ORAL_TABLET | Freq: Every day | ORAL | Status: DC

## 2014-07-29 ENCOUNTER — Ambulatory Visit (HOSPITAL_COMMUNITY)
Admission: RE | Admit: 2014-07-29 | Discharge: 2014-07-29 | Disposition: A | Discharge status: Home / Self Care | Payer: Medicare Other | Source: Ambulatory Visit | Attending: Internal Medicine | Admitting: Internal Medicine

## 2014-07-29 ENCOUNTER — Telehealth (HOSPITAL_COMMUNITY): Payer: Self-pay | Admitting: Cardiology

## 2014-07-29 ENCOUNTER — Encounter (HOSPITAL_COMMUNITY): Payer: Self-pay | Admitting: *Deleted

## 2014-07-29 ENCOUNTER — Ambulatory Visit (HOSPITAL_COMMUNITY): Payer: Self-pay | Admitting: *Deleted

## 2014-07-29 ENCOUNTER — Encounter (HOSPITAL_COMMUNITY): Payer: Self-pay

## 2014-07-29 ENCOUNTER — Other Ambulatory Visit (HOSPITAL_COMMUNITY): Payer: Self-pay | Admitting: *Deleted

## 2014-07-29 VITALS — BP 104/0 | HR 75 | Ht 73.0 in | Wt 199.0 lb

## 2014-07-29 DIAGNOSIS — Z8673 Personal history of transient ischemic attack (TIA), and cerebral infarction without residual deficits: Secondary | ICD-10-CM | POA: Insufficient documentation

## 2014-07-29 DIAGNOSIS — Z7901 Long term (current) use of anticoagulants: Secondary | ICD-10-CM | POA: Insufficient documentation

## 2014-07-29 DIAGNOSIS — Z95811 Presence of heart assist device: Secondary | ICD-10-CM

## 2014-07-29 DIAGNOSIS — I4891 Unspecified atrial fibrillation: Secondary | ICD-10-CM | POA: Insufficient documentation

## 2014-07-29 DIAGNOSIS — E039 Hypothyroidism, unspecified: Secondary | ICD-10-CM | POA: Diagnosis not present

## 2014-07-29 DIAGNOSIS — I252 Old myocardial infarction: Secondary | ICD-10-CM | POA: Diagnosis not present

## 2014-07-29 DIAGNOSIS — I5022 Chronic systolic (congestive) heart failure: Secondary | ICD-10-CM | POA: Diagnosis not present

## 2014-07-29 DIAGNOSIS — I739 Peripheral vascular disease, unspecified: Secondary | ICD-10-CM | POA: Diagnosis not present

## 2014-07-29 DIAGNOSIS — I2510 Atherosclerotic heart disease of native coronary artery without angina pectoris: Secondary | ICD-10-CM | POA: Insufficient documentation

## 2014-07-29 DIAGNOSIS — I129 Hypertensive chronic kidney disease with stage 1 through stage 4 chronic kidney disease, or unspecified chronic kidney disease: Secondary | ICD-10-CM | POA: Diagnosis not present

## 2014-07-29 DIAGNOSIS — R319 Hematuria, unspecified: Secondary | ICD-10-CM | POA: Diagnosis present

## 2014-07-29 DIAGNOSIS — N3001 Acute cystitis with hematuria: Secondary | ICD-10-CM

## 2014-07-29 DIAGNOSIS — N183 Chronic kidney disease, stage 3 unspecified: Secondary | ICD-10-CM | POA: Diagnosis not present

## 2014-07-29 DIAGNOSIS — R3919 Other difficulties with micturition: Secondary | ICD-10-CM | POA: Insufficient documentation

## 2014-07-29 DIAGNOSIS — Z95810 Presence of automatic (implantable) cardiac defibrillator: Secondary | ICD-10-CM | POA: Insufficient documentation

## 2014-07-29 DIAGNOSIS — Z9861 Coronary angioplasty status: Secondary | ICD-10-CM | POA: Insufficient documentation

## 2014-07-29 DIAGNOSIS — Z79899 Other long term (current) drug therapy: Secondary | ICD-10-CM | POA: Insufficient documentation

## 2014-07-29 DIAGNOSIS — I5020 Unspecified systolic (congestive) heart failure: Secondary | ICD-10-CM | POA: Diagnosis present

## 2014-07-29 DIAGNOSIS — I159 Secondary hypertension, unspecified: Secondary | ICD-10-CM

## 2014-07-29 LAB — URINALYSIS, ROUTINE W REFLEX MICROSCOPIC
GLUCOSE, UA: NEGATIVE mg/dL
Ketones, ur: 15 mg/dL — AB
Nitrite: NEGATIVE
PH: 6 (ref 5.0–8.0)
Protein, ur: 100 mg/dL — AB
Specific Gravity, Urine: 1.018 (ref 1.005–1.030)
Urobilinogen, UA: 1 mg/dL (ref 0.0–1.0)

## 2014-07-29 LAB — URINE MICROSCOPIC-ADD ON

## 2014-07-29 LAB — COMPREHENSIVE METABOLIC PANEL
ALT: 18 U/L (ref 0–53)
ANION GAP: 12 (ref 5–15)
AST: 29 U/L (ref 0–37)
Albumin: 3.7 g/dL (ref 3.5–5.2)
Alkaline Phosphatase: 118 U/L — ABNORMAL HIGH (ref 39–117)
BUN: 23 mg/dL (ref 6–23)
CALCIUM: 9 mg/dL (ref 8.4–10.5)
CO2: 23 mEq/L (ref 19–32)
CREATININE: 1.42 mg/dL — AB (ref 0.50–1.35)
Chloride: 103 mEq/L (ref 96–112)
GFR calc non Af Amer: 47 mL/min — ABNORMAL LOW (ref >90)
GFR, EST AFRICAN AMERICAN: 55 mL/min — AB (ref >90)
GLUCOSE: 105 mg/dL — AB (ref 70–99)
POTASSIUM: 4.2 meq/L (ref 3.7–5.3)
Sodium: 138 mEq/L (ref 137–147)
TOTAL PROTEIN: 6.8 g/dL (ref 6.0–8.3)
Total Bilirubin: 0.6 mg/dL (ref 0.3–1.2)

## 2014-07-29 LAB — CBC
HCT: 33.6 % — ABNORMAL LOW (ref 39.0–52.0)
Hemoglobin: 11.2 g/dL — ABNORMAL LOW (ref 13.0–17.0)
MCH: 30.9 pg (ref 26.0–34.0)
MCHC: 33.3 g/dL (ref 30.0–36.0)
MCV: 92.6 fL (ref 78.0–100.0)
Platelets: 183 10*3/uL (ref 150–400)
RBC: 3.63 MIL/uL — ABNORMAL LOW (ref 4.22–5.81)
RDW: 16 % — AB (ref 11.5–15.5)
WBC: 5.4 10*3/uL (ref 4.0–10.5)

## 2014-07-29 LAB — PROTIME-INR
INR: 2.86 — ABNORMAL HIGH (ref 0.00–1.49)
Prothrombin Time: 30 seconds — ABNORMAL HIGH (ref 11.6–15.2)

## 2014-07-29 LAB — DIGOXIN LEVEL: Digoxin Level: <0.3 ng/mL — ABNORMAL LOW (ref 0.8–2.0)

## 2014-07-29 LAB — LACTATE DEHYDROGENASE: LDH: 290 U/L — ABNORMAL HIGH (ref 94–250)

## 2014-07-29 LAB — PRO B NATRIURETIC PEPTIDE: Pro B Natriuretic peptide (BNP): 1468 pg/mL — ABNORMAL HIGH (ref 0–125)

## 2014-07-29 MED ORDER — HYDRALAZINE HCL 25 MG PO TABS
12.5000 mg | ORAL_TABLET | Freq: Three times a day (TID) | ORAL | Status: DC
Start: 2014-07-29 — End: 2014-09-23

## 2014-07-29 MED ORDER — LEVOFLOXACIN 500 MG PO TABS: 500.0000 mg | ORAL_TABLET | Freq: Every day | ORAL | Status: DC

## 2014-07-29 NOTE — Progress Notes (Signed)
Patient ID: [name], male   DOB: 1941-10-26, 73 y.o.   MRN: [id number]  PCP: Dr. Karolee Stamps ([place])   [name] is a 73 yo male with h/o VT, PAD and severe systolic HF (EF 90-30%) due to mixed ischemic/nonischemic CM he is s/p HM IILVAD implant for destination therapy on October 19, 2013. His course was complicated by VT, syncope, A fib/Aflutter and persistent low output felt to be due to RHF. RHC showed low cardiac output but no evidence of RV failure on Milrinone. He was felt to be volume depleted. He was discharged home on 11/18/13 on mirlinone and PRN lasix 20 mg for a weight of 200 pounds or greater. Milrinone has since been weaned off.   RHC (07/13/14): Well compensated hemodynamics with VAD support.  Follow up for Heart Failure/LVAD: Since last visit underwent RHC for worsening renal function and had well compensated hemodynamics with VAD support, and increased speed to 9200. Been feeling great. Denies SOB, orthopnea, PND or CP. Walking 2-2 1/2 miles a day with no issues. Compliant with medications. Weight at home 190-191 lbs. Has not needed any lasix.  Following a low salt diet and drinking less than 2L a day. This am woke up and has coca-cola colored urine. No power spikes or HF symptoms.   Denies LVAD alarms.  Denies driveline trauma, erythema or drainage.  Denies ICD shocks.    Reports taking Coumadin as prescribed and adherence to anticoagulation based dietary restrictions.  Denies bright red blood per rectum or melena, no dark urine or hematuria.     Past Medical History  Diagnosis Date  . CAD (coronary artery disease)     s/p MI 1982;  s/p DES to CFX 2011;  s/p NSTEMI 4/12 in setting of VTach;  cath 7/12:  LM ok, LAD mid 30-40%, Dx's with 40%, mCFX stent patent, prox to mid RCA occluded with R to R and L to R collats.  Medical Tx was continued  . Bradycardia   . Ventricular tachycardia     s/p AICD;   h/o ICD shock;  Amiodarone Rx. S/P ablation in 2012  . PVD  (peripheral vascular disease)     h/o claudication;  s/p R fem to pop BPG 3/11;  s/p L CFA BPG 7/03;  s/p repair of inf AAA 6/03;  s/p aorta to bilat renal BPG 6/03  . HTN (hypertension)   . HLD (hyperlipidemia)   . Cytopenia   . Hypothyroidism   . Renal insufficiency   . Claudication   . Chronic systolic heart failure   . Ischemic cardiomyopathy     echo 7/12:  EF 25-30%, mild AI, mod MR, mod LAE  . TIA (transient ischemic attack) 2001  . CVD (cerebrovascular disease)     left CEA 2008  . Stroke   . Anemia   . Inappropriate therapy from implantable cardioverter-defibrillator 04/02/2013    ATP for sinus tach  SVT wavelet identified SVT but was no  passive   . Myocardial infarction 1992  . Anginal pain   . Automatic implantable cardioverter-defibrillator in situ     Medtronic Evera device serial number [id number]    . Sleep apnea     denies wearing mask (05/25/2013)  . Arthritis     "a little in my knees" (05/25/2013)  . Melanoma of ear 2013    "near left ear" (05/25/2013)  . 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 CHF (congestive heart failure)  Current Outpatient Prescriptions  Medication Sig Dispense Refill  . amiodarone (PACERONE) 200 MG tablet Take 2 tablets (400 mg total) by mouth 2 (two) times daily.  120 tablet  2  . amLODipine (NORVASC) 10 MG tablet Take one tab daily  60 tablet  6  . aspirin 81 MG tablet Take 81 mg by mouth daily.       [name] atorvastatin (LIPITOR) 80 MG tablet Take 1 tablet (80 mg total) by mouth daily.  90 tablet  2  . CALCIUM-MAGNESIUM-ZINC PO Take 3 tablets by mouth at bedtime. 1000 mg calcium, 400 mg magnesium, 25 mg zinc      . digoxin (LANOXIN) 0.125 MG tablet Take 0.0625 mg by mouth daily.      [name] docusate sodium (COLACE) 100 MG capsule TAKE 2 CAPSULES BY MOUTH EVERY DAY AS NEEDED  60 capsule  6  . ferrous gluconate (FERGON) 324 MG tablet Take 1 tablet (324 mg total) by mouth 3 (three) times daily with meals.  90 tablet  6  . levothyroxine  (SYNTHROID) 125 MCG tablet Take 1 tablet (125 mcg total) by mouth daily.  30 tablet  11  . Multiple Vitamin (MULTIVITAMIN) tablet Take 1 tablet by mouth daily.       . Multiple Vitamins-Minerals (EYE VITAMINS PO) Take 1 capsule by mouth 2 (two) times daily.       . pantoprazole (PROTONIX) 40 MG tablet Take 1 tablet (40 mg total) by mouth daily.  30 tablet  6  . RANEXA 500 MG 12 hr tablet TAKE 1 TABLET (500 MG TOTAL) BY MOUTH 2 (TWO) TIMES DAILY.  60 tablet  6  . warfarin (COUMADIN) 2 MG tablet Take 2-4 mg by mouth daily. Take 2 tablets (4 mg) on Monday, Wednesday and Friday, take 1 tablet (2 mg) on Sunday, Tuesday, Thursday and Saturday      . nitroGLYCERIN (NITROSTAT) 0.4 MG SL tablet Place 0.4 mg under the tongue every 5 (five) minutes as needed for chest pain. For chest pain       No current facility-administered medications for this encounter.    Demerol; Meperidine hcl; and Opium  REVIEW OF SYSTEMS: All systems negative except as listed in HPI, PMH and Problem list.  LVAD INTERROGATION:   HeartMate II LVAD:   Speed: 9200 Flow 4.7 liters/min Power 5.3 PI 6.8 Alarms: none Events: PI events 9-25 a day Fixed speed: 9200 Low speed limit:  8600  I reviewed the LVAD parameters from today, and compared the results to the patient's prior recorded data.  Increased set speed to 9200 and back-up speed to 8600. The LVAD is functioning within specified parameters.  The patient performs LVAD self-test daily.  LVAD interrogation was negative for any significant power changes, alarms or PI events/speed drops.  LVAD equipment check completed and is in good working order.  Back-up equipment present.   LVAD education done on emergency procedures and precautions and reviewed exit site care.    Filed Vitals:   07/29/14 1017  BP: 104/0  Pulse: 75  Height: 6\' 1"  (1.854 m)  Weight: 199 lb (90.266 kg)  SpO2: 98%    Physical Exam: GENERAL: Well appearing, male who presents to clinic today in no  acute distress. Wife present HEENT: normal  NECK: Supple, JVP 8-9 .  2+ bilaterally, no bruits.  No lymphadenopathy or thyromegaly appreciated.   CARDIAC:  Mechanical heart sounds with LVAD hum present.  LUNGS:  Clear to auscultation bilaterally.  ABDOMEN:  Soft, round, nontender,  positive bowel sounds x4.     LVAD exit site: well-healed and incorporated.  Dressing dry and intact.  No erythema or drainage.  Stabilization device present and accurately applied.  Driveline dressing is being changed weekly per sterile technique. EXTREMITIES:  Warm and dry, no cyanosis, clubbing, bilateral 2 + bilateral edema NEUROLOGIC:  Alert and oriented x 4.  Gait steady.  No aphasia.  No dysarthria.  Affect pleasant.      ASSESSMENT AND PLAN:   1. Chronic systolic HF: s/p LVAD 68/1275 - NYHA II symptoms and volume status stable. He is no longer on any lasix and will continue 20 gm PRN. Discussed the use of sliding scale diuretics.  - Optivol interrogated: no crossings over threshold and thoracic impedence up, no afib, activity about 2 hrs a day and 100 % BiV pacing.   - He is not on a BB with history of RV failure. - MAP slightly and tried small 2.5 mg of Altace in the past and had AKI. Will start hydralazine 12.5 mg TID.  - Reinforced the need and importance of daily weights, a low sodium diet, and fluid restriction (less than 2 L a day). Instructed to call the HF clinic if weight increases more than 3 lbs overnight or 5 lbs in a week.  2. Afib/Flutter - s/p DC-CV 06/04/14. He remains in NSR from [place]. Will continue amiodarone 200 mg BID.  2) HTN - Mildly elevated. As above will try low dose hydralazine.  3) LVAD - LVAD parameters stable. He is having increased PI events, however no symptoms.  - Check BMET, LDH, INR, CBC and pro-BNP  - Check UA 4) CKD, stage III - Check BMET today.  5) Anticoagulation management: - Check INR today and will address accordingly 6) Coca-colored urine - New to patient  this am. He denies any HF symptoms or power elevations. Awaiting LDH, INR and UA. If LDH up, INR low or urobilinogen on UA will plan for admit for heparin.  F/U 4 weeks Junie Bame B  NP-C 10:35 AM   Addendum: LDH nl, INR normal and no urobilinogen. UA shows moderate bilirubin, + Ketones, + protein and moderate leukocytes. Patient may have renal stone had 12 mm R kidney on CT with contrast in 09/2013. Will get CT of chest/abdomen and pelvis today and pending UA cx and will treat with abx.

## 2014-07-29 NOTE — Patient Instructions (Addendum)
1.  Start hydralazine to 12.5 mg three times daily 2.  No change in coumadin dose; re-check INR Thursday 08/05/14. 3.  Start antibiotic, Levofloxin (Levaquin) 500 mg, one tablet daily x 10 days.  4.  Return to [place] clinic 1 month. 5.  Chest, abdomen, pelvis CT today to r/o VAD inflow/outflow obstruction and renal calculi. 6.  Referral to Dr. Risa Grill at [facility] tomorrow am at 9 am, pt is to arrive by 8:45 am.

## 2014-07-29 NOTE — Telephone Encounter (Signed)
Pt to have CT chest/abd/pelvis Cpt code [phone] With pts current insurance- [employer] auth # [id number]

## 2014-07-29 NOTE — Progress Notes (Addendum)
Symptom  Yes  No  Details   Angina       x Activity:   Claudication        x How far:   Syncope        x When:     Stroke        x        TIA  2001  Orthopnea         x How many pillows: 1  PND         x How often:  CPAP       N/A How many hrs:   Pedal edema        x   minimal lower extremity swelling  Abd fullness               x    N&V        x         1 x weekly  Diaphoresis               x   Bleeding              x   Urine color        tea colored starting this am  SOB        x      Activity:  Outside in hot, humid weather  Palpitations        x When:  ICD shock        x   Hospitlizaitons             x When/where/why:   ED visit        x When/where/why:  Other MD              x When/who/why:   Activity        Working 5 hrs/5 days week; walking 2 miles day  Fluid    No limitations  < 2000   Diet    Low salt food   Vital signs: HR: 75 MAP BP:  104 O2 Sat:  98 Wt:  199 bs Last wt: 199.4   lbs Ht: 6'1"  LVAD interrogation reveals:  Speed:   9200 RPM Flow:  4.5 Power:   5.1 PI:  6.6 Alarms:  One low voltage Events:  3 - 15 PI events; 25 on 06/19/14 Fixed speed:  9200 Low speed limit:  8600 Primary Controller:  Replace back up battery in 23 months  (Feb 2017) Back up controller:   Replace back up battery in 23 months (Feb 2017)   VAD dressing removed and site care performed using sterile technique. Drive line exit site cleaned with Chlora prep applicators x 2, allowed to dry, and Sorbvaview dressing with Biopatch re-applied. Drive line incorporated, the velour is fully implanted at exit site. No erythema, tenderness, drainage, or foul odor noted. Drive line anchor in place. Pt denies fever or chills. Driveline dressing is being changed every week per sterile technique using Sorbaview dressing.   Pt/caregiver deny any alarms or VAD equipment issues. VAD coordinator reviewed daily log from home for daily temperature, weight, and VAD parameters. Pt is performing daily  controller and system monitor self tests along with completing weekly and monthly maintenance for LVAD equipment.  LVAD equipment check completed and is in good working order. Back-up equipment present. Emergency procedures and precautions reviewed.  Optivol interrogation completed.  UA obtained for c/o tea colored urine - sent for stat UA and culture.  LDH, Hgb remain wnl. CT chest/abd/pelvis to r/o VAD inflow/outlfow obstruction, renal calculi. CT reveals mild left-sided urinary tract obstruction secondary to a left  ureteropelvic junction calculus.  Levaquin 500 mg daily x 10 days per Dr. Haroldine Laws; urine culture pending.  Urology consult - pt has appt with Dr. Risa Grill at [facility] at 9 am in the morning. Clinic note, labs, and CT results faxed to Dr. Cy Blamer office per his request.

## 2014-07-30 LAB — URINE CULTURE
CULTURE: NO GROWTH
Colony Count: NO GROWTH

## 2014-08-02 ENCOUNTER — Other Ambulatory Visit: Payer: Self-pay | Admitting: Urology

## 2014-08-04 ENCOUNTER — Encounter (HOSPITAL_COMMUNITY): Payer: Self-pay | Admitting: Pharmacy Technician

## 2014-08-05 ENCOUNTER — Telehealth (HOSPITAL_COMMUNITY): Payer: Self-pay | Admitting: *Deleted

## 2014-08-05 NOTE — Telephone Encounter (Signed)
Called pt and [name] to confirm uteroscopy will be as planned at [place] on 08/09/14.  Pt has pre surgical appt at [facility]; he will get INR check then, so we will cancel PT/INR for today. Pt is to hold coumadin Sunday evening dose per Dr. Darcey Nora. [name] verbalized understanding of same.  Plan for VAD coordinator to meet patient at [facility] Monday am at 6:15 in short stay prior to procedure. I have spoke with anesthesia Dr. Jillyn Hidden [facility]) and Dr. Linna Caprice [facility]) and they will determine anesthesia coverage for this case.  I have given VAD pager contact information to [facility] pre surgical nurse and anesthesiologist and asked them to call for any questions or concerns.

## 2014-08-05 NOTE — Patient Instructions (Addendum)
[name]  08/05/2014                           YOUR PROCEDURE IS SCHEDULED ON:  08/09/14               ENTER THRU [place] MAIN HOSPITAL ENTRANCE AND                           FOLLOW  SIGNS TO SHORT STAY CENTER                 ARRIVE AT SHORT STAY AT:  5:30 AM               CALL THIS NUMBER IF ANY PROBLEMS THE DAY OF SURGERY :               [phone]                                REMEMBER:   Do not eat food or drink liquids AFTER MIDNIGHT                  Take these medicines the morning of surgery with               A SIPS OF WATER :  PROTONIX / RANEXA / AMIODARONE / ASPIRIN / ATORAVASTATIN / LEVOTHYROXINE / HYDRALOZINE / LEVOFLOXACIN       Do not wear jewelry, make-up   Do not wear lotions, powders, or perfumes.   Do not shave legs or underarms 12 hrs. before surgery (men may shave face)  Do not bring valuables to the hospital.  Contacts, dentures or bridgework may not be worn into surgery.  Leave suitcase in the car. After surgery it may be brought to your room.  For patients admitted to the hospital more than one night, checkout time is            11:00 AM                                                       The day of discharge.   Patients discharged the day of surgery will not be allowed to drive home.            If going home same day of surgery, must have someone stay with you              FIRST 24 hrs at home and arrange for some one to drive you              home from hospital.   ________________________________________________________________________                                                                                                  [place]  FOR SURGERY  Before surgery, you can play an important role.  Because skin is not sterile, your skin needs to be as free of germs as possible.  You can reduce the number of germs on your skin by washing with CHG (chlorahexidine gluconate) soap before surgery.  CHG is an  antiseptic cleaner which kills germs and bonds with the skin to continue killing germs even after washing. Please DO NOT use if you have an allergy to CHG or antibacterial soaps.  If your skin becomes reddened/irritated stop using the CHG and inform your nurse when you arrive at Short Stay. Do not shave (including legs and underarms) for at least 48 hours prior to the first CHG shower.  You may shave your face. Please follow these instructions carefully:   1.  Shower with CHG Soap the night before surgery and the  morning of Surgery.   2.  If you choose to wash your hair, wash your hair first as usual with your  normal  Shampoo.   3.  After you shampoo, rinse your hair and body thoroughly to remove the  shampoo.                                         4.  Use CHG as you would any other liquid soap.  You can apply chg directly  to the skin and wash . Gently wash with scrungie or clean wascloth    5.  Apply the CHG Soap to your body ONLY FROM THE NECK DOWN.   Do not use on open                           Wound or open sores. Avoid contact with eyes, ears mouth and genitals (private parts).                        Genitals (private parts) with your normal soap.              6.  Wash thoroughly, paying special attention to the area where your surgery  will be performed.   7.  Thoroughly rinse your body with warm water from the neck down.   8.  DO NOT shower/wash with your normal soap after using and rinsing off  the CHG Soap .                9.  Pat yourself dry with a clean towel.             10.  Wear clean pajamas.             11.  Place clean sheets on your bed the night of your first shower and do not  sleep with pets.  Day of Surgery : Do not apply any lotions/deodorants the morning of surgery.  Please wear clean clothes to the hospital/surgery center.  FAILURE TO FOLLOW THESE INSTRUCTIONS MAY RESULT IN THE CANCELLATION OF YOUR SURGERY    PATIENT  SIGNATURE_________________________________  ______________________________________________________________________

## 2014-08-05 NOTE — Telephone Encounter (Signed)
Called [name] to clarify OR time Monday at [facility] will be 9 am based on Dr.Joslin's availability. Spoke with Dr. Risa Grill to confirm OR time change. Spoke with Dr. Jillyn Hidden at [facility] and asked that A-line, [place], and Neo be available for Dr. Verneda Skill arrival at @ 8:30 Monday am.

## 2014-08-06 ENCOUNTER — Encounter (HOSPITAL_COMMUNITY)
Admission: RE | Admit: 2014-08-06 | Discharge: 2014-08-06 | Disposition: A | Discharge status: Home / Self Care | Payer: Medicare Other | Source: Ambulatory Visit | Attending: Urology | Admitting: Urology

## 2014-08-06 ENCOUNTER — Ambulatory Visit (HOSPITAL_COMMUNITY): Payer: Self-pay | Admitting: *Deleted

## 2014-08-06 ENCOUNTER — Encounter (HOSPITAL_COMMUNITY): Payer: Self-pay

## 2014-08-06 DIAGNOSIS — E785 Hyperlipidemia, unspecified: Secondary | ICD-10-CM | POA: Diagnosis not present

## 2014-08-06 DIAGNOSIS — E039 Hypothyroidism, unspecified: Secondary | ICD-10-CM | POA: Diagnosis not present

## 2014-08-06 DIAGNOSIS — Z7901 Long term (current) use of anticoagulants: Secondary | ICD-10-CM | POA: Diagnosis not present

## 2014-08-06 DIAGNOSIS — I2510 Atherosclerotic heart disease of native coronary artery without angina pectoris: Secondary | ICD-10-CM | POA: Diagnosis not present

## 2014-08-06 DIAGNOSIS — M1990 Unspecified osteoarthritis, unspecified site: Secondary | ICD-10-CM | POA: Diagnosis not present

## 2014-08-06 DIAGNOSIS — D649 Anemia, unspecified: Secondary | ICD-10-CM | POA: Diagnosis not present

## 2014-08-06 DIAGNOSIS — Z7982 Long term (current) use of aspirin: Secondary | ICD-10-CM | POA: Diagnosis not present

## 2014-08-06 DIAGNOSIS — Z950 Presence of cardiac pacemaker: Secondary | ICD-10-CM | POA: Diagnosis not present

## 2014-08-06 DIAGNOSIS — I10 Essential (primary) hypertension: Secondary | ICD-10-CM | POA: Diagnosis not present

## 2014-08-06 DIAGNOSIS — I255 Ischemic cardiomyopathy: Secondary | ICD-10-CM | POA: Diagnosis not present

## 2014-08-06 DIAGNOSIS — G4730 Sleep apnea, unspecified: Secondary | ICD-10-CM | POA: Diagnosis not present

## 2014-08-06 DIAGNOSIS — I739 Peripheral vascular disease, unspecified: Secondary | ICD-10-CM | POA: Diagnosis not present

## 2014-08-06 DIAGNOSIS — Z885 Allergy status to narcotic agent status: Secondary | ICD-10-CM | POA: Diagnosis not present

## 2014-08-06 DIAGNOSIS — Z87891 Personal history of nicotine dependence: Secondary | ICD-10-CM | POA: Diagnosis not present

## 2014-08-06 DIAGNOSIS — I472 Ventricular tachycardia: Secondary | ICD-10-CM | POA: Diagnosis not present

## 2014-08-06 DIAGNOSIS — Z79899 Other long term (current) drug therapy: Secondary | ICD-10-CM | POA: Diagnosis not present

## 2014-08-06 DIAGNOSIS — Z8673 Personal history of transient ischemic attack (TIA), and cerebral infarction without residual deficits: Secondary | ICD-10-CM | POA: Diagnosis not present

## 2014-08-06 DIAGNOSIS — N289 Disorder of kidney and ureter, unspecified: Secondary | ICD-10-CM | POA: Diagnosis not present

## 2014-08-06 DIAGNOSIS — I252 Old myocardial infarction: Secondary | ICD-10-CM | POA: Diagnosis not present

## 2014-08-06 DIAGNOSIS — I5022 Chronic systolic (congestive) heart failure: Secondary | ICD-10-CM | POA: Diagnosis not present

## 2014-08-06 DIAGNOSIS — N201 Calculus of ureter: Secondary | ICD-10-CM | POA: Diagnosis not present

## 2014-08-06 HISTORY — DX: Cardiac arrhythmia, unspecified: I49.9

## 2014-08-06 LAB — BASIC METABOLIC PANEL
Anion gap: 9 (ref 5–15)
BUN: 21 mg/dL (ref 6–23)
CALCIUM: 9.2 mg/dL (ref 8.4–10.5)
CO2: 27 mEq/L (ref 19–32)
Chloride: 102 mEq/L (ref 96–112)
Creatinine, Ser: 1.49 mg/dL — ABNORMAL HIGH (ref 0.50–1.35)
GFR calc Af Amer: 52 mL/min — ABNORMAL LOW (ref >90)
GFR calc non Af Amer: 44 mL/min — ABNORMAL LOW (ref >90)
GLUCOSE: 92 mg/dL (ref 70–99)
Potassium: 4.3 mEq/L (ref 3.7–5.3)
SODIUM: 138 meq/L (ref 137–147)

## 2014-08-06 LAB — CBC
HCT: 33.5 % — ABNORMAL LOW (ref 39.0–52.0)
HEMOGLOBIN: 11.1 g/dL — AB (ref 13.0–17.0)
MCH: 31.2 pg (ref 26.0–34.0)
MCHC: 33.1 g/dL (ref 30.0–36.0)
MCV: 94.1 fL (ref 78.0–100.0)
Platelets: 187 10*3/uL (ref 150–400)
RBC: 3.56 MIL/uL — AB (ref 4.22–5.81)
RDW: 16.1 % — ABNORMAL HIGH (ref 11.5–15.5)
WBC: 5.4 10*3/uL (ref 4.0–10.5)

## 2014-08-06 LAB — PROTIME-INR
INR: 2.45 — ABNORMAL HIGH (ref 0.00–1.49)
INR: 2.4 — AB (ref <=1.1)
PROTHROMBIN TIME: 26.8 s — AB (ref 11.6–15.2)

## 2014-08-06 LAB — APTT: aPTT: 47 seconds — ABNORMAL HIGH (ref 24–37)

## 2014-08-06 NOTE — Progress Notes (Signed)
Dr. Jillyn Hidden came to speak to pt in PST. Dannial Monarch - from Operating Room Services -  notified to be at hospital 08/09/14 at 7:30 am Pt voiced understanding of instructions given by Zada Girt and myself.

## 2014-08-06 NOTE — H&P (Signed)
History of Present Illness   [name] presents today as a referral from Dr Haroldine Laws and the heart failure LVAD team for further assessment of some gross hematuria and a recently diagnosed large left UPJ renal calculus. [name] is currently 73 years of age. He has an extremely complicated past medical history. The patient had LVAD placement and has done very well with that. He is on chronic anticoagulation. I have reviewed his surgeries and his past medical history as well as his current medications. He has no prior urologic history. He has not had any definitive known episodes of nephrolithiasis. The patient recently developed some discolored urine with gross hematuria. He has not had any significant flank or abdominal discomfort. CT scan revealed a 1.1 x 1.3 cm stone at his left ureteropelvic junction. There is mild hydronephrosis. Creatinine was stable at 1.42. It appears that his creatinine typically ranges from approximately 1.4 to slightly over 2. Urine culture remains pending at this time.   I was able to contact Dr Haroldine Laws this morning to discuss the situation at length. We talked about some of the logistical issues with regard to anesthesia and monitoring and what typically takes place. We talked about options for surgical intervention that would or would not require discontinuation of anticoagulation as well as the option of a simple temporizing maneuver with double J stent alone. Dr Haroldine Laws felt that his quality of life had improved dramatically on LVAD and was doing quite well. He felt that we should press ahead with an attempted definitive management of the stone rather than any kind of temporizing situation with double J stent placement. He is willing to assemble a team to come to [facility] where we have the technology and equipment necessary to deal with the stone in the most efficacious manner.      Past Medical History Problems  1. History of Bradycardia (427.89) 2. History of CAD  (coronary artery disease) (414.00) 3. History of Chronic clinical systolic heart failure (245.80) 4. History of Claudication (443.9) 5. History of anemia (V12.3) 6. History of arthritis (V13.4) 7. History of cardiovascular disorder (V12.50) 8. History of congestive heart failure (V12.59) 9. History of cytopenia (V12.3) 10. History of hyperlipidemia (V12.29) 11. History of hypertension (V12.59) 12. History of hypothyroidism (V12.29) 13. History of myocardial infarction (412) 14. History of peripheral vascular disease (V12.59) 15. History of renal insufficiency syndrome (V13.09) 16. History of sleep apnea (V13.89) 17. History of stroke (V12.54) 18. History of transient cerebral ischemia (V12.54) 19. History of Ischemic cardiomyopathy (414.8) 20. 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 History of Ventricular tachycardia (paroxysmal) (427.1)  Surgical History Problems  1. History of Heart Surgery 2. History of Pacemaker Placement  Current Meds 1. Adult Aspirin Low Strength 81 MG TBDP;  Therapy: (Recorded:25Sep2015) to Recorded 2. Amiodarone HCl - 200 MG Oral Tablet;  Therapy: (Recorded:25Sep2015) to Recorded 3. Cal-Mag-Zinc TABS;  Therapy: (Recorded:25Sep2015) to Recorded 4. Colace CAPS;  Therapy: (Recorded:25Sep2015) to Recorded 5. Coumadin 2 MG Oral Tablet (Warfarin Sodium);  Therapy: (Recorded:25Sep2015) to Recorded 6. Digoxin 125 MCG Oral Tablet;  Therapy: (Recorded:25Sep2015) to Recorded 7. Fergon TABS;  Therapy: (Recorded:25Sep2015) to Recorded 8. Lipitor 80 MG Oral Tablet (Atorvastatin Calcium);  Therapy: (Recorded:25Sep2015) to Recorded 9. Multi-Day TABS;  Therapy: (Recorded:25Sep2015) to Recorded 10. Nitrostat SUBL;   Therapy: (Recorded:25Sep2015) to Recorded 11. Norvasc 10 MG Oral Tablet (AmLODIPine Besylate);   Therapy: (Recorded:25Sep2015) to Recorded 12. Protonix 40 MG Intravenous Solution Reconstituted (Pantoprazole Sodium);   Therapy: (Recorded:25Sep2015)  to Recorded 13. Ranexa  500 MG Oral Tablet Extended Release 12 Hour;   Therapy: (Recorded:25Sep2015) to Recorded 14. Synthroid 125 MCG Oral Tablet (Levothyroxine Sodium);   Therapy: (Recorded:25Sep2015) to Recorded  Allergies Medication  1. Demerol TABS  Family History Problems  1. Family history of heart failure (V17.49) : Mother, Father  Social History Problems    Alcohol use (V49.89)   socially   Caffeine use (V49.89)   2 qd   Father's age   69yrs, heart faliure   Former smoker (V15.82)   Mother deceased   61yrs, heart faliure   Retired   Single   Two children  Review of Systems Genitourinary, constitutional, skin, eye, otolaryngeal, hematologic/lymphatic, cardiovascular, pulmonary, endocrine, musculoskeletal, gastrointestinal, neurological and psychiatric system(s) were reviewed and pertinent findings if present are noted.  Genitourinary: nocturia.  Gastrointestinal: vomiting.  Hematologic/Lymphatic: a tendency to easily bruise.  Cardiovascular: leg swelling.  Respiratory: cough.  Musculoskeletal: joint pain.    Vitals Vital Signs [Data Includes: Last 1 Day]  Recorded: [id number] 09:06AM  Height: 6 ft 1 in Weight: 199 lb  BMI Calculated: 26.25 BSA Calculated: 2.15 Temperature: 97.6 F  Physical Exam Constitutional: Well nourished and well developed . No acute distress.  ENT:. The ears and nose are normal in appearance.  Neck: The appearance of the neck is normal and no neck mass is present.  Pulmonary: No respiratory distress and normal respiratory rhythm and effort.  Abdomen: The abdomen is soft and nontender. No masses are palpated. No CVA tenderness. No hernias are palpable. No hepatosplenomegaly noted.  Genitourinary: Examination of the penis demonstrates no discharge, no masses, no lesions and a normal meatus. The scrotum is without lesions. The right epididymis is palpably normal and non-tender. The left epididymis is palpably normal and  non-tender. The right testis is non-tender and without masses. The left testis is non-tender and without masses.  Skin: Normal skin turgor, no visible rash and no visible skin lesions.  Neuro/Psych:. Mood and affect are appropriate.    Results/Data  30 Jul 2014 8:58 AM  UA With REFLEX    COLOR RED     APPEARANCE CLOUDY     SPECIFIC GRAVITY 1.020     pH 6.5     GLUCOSE 100     BILIRUBIN NEG     KETONE TRACE     BLOOD LARGE     PROTEIN > 300     UROBILINOGEN 1     NITRITE POS     LEUKOCYTE ESTERASE TRACE     SQUAMOUS EPITHELIAL/HPF NONE SEEN     WBC NONE SEEN     CRYSTALS NONE SEEN     CASTS NONE SEEN     RBC TNTC     BACTERIA NONE SEEN     Assessment Assessed  1. Nephrolithiasis (592.0) 2. Hydronephrosis (591) 3. Gross hematuria (599.71)  Plan Gross hematuria  1. Follow-up Schedule Surgery Office  Follow-up  Status: Complete  Done: [id number]  Discussion/Summary Mr. Gustafson has an extremely complicated past and ongoing medical history. I reviewed records from [facility] as well his his imaging studies.  Fortunately, there is no emergent need to intervene for his stone. His pain is currently well managed, his hydronephrosis is relatively mild, his overall systemic renal function is at baseline and he has no evidence currently of urinary tract infection. This stone however does require definitive management. Lithotripsy would be difficult and would require discontinuation of all anticoagulation. Given the size and density of the stone there would also be a reasonably high  likelihood that secondary procedures would be necessary.  This stone is probably best managed with ureteroscopy with holmium laser lithotripsy. His can be done with continuation of anticoagulation but obviously given the location of the size of the stone can be time consuming and at times difficult. The situation was discussed at length with Dr. Haroldine Laws. There entire heart failure and LVAD team is located at [facility]. All of our equipment and expertise needed to perform the surgery safely and effectively is at [facility]. He will be willing to transfer his team to our hospital here as soon as we can make the proper arrangements. Procedure and its risks and benefits were discussed at length with the patient and his wife today.  50 minutes spent face-to-face and coordinating care.

## 2014-08-09 ENCOUNTER — Ambulatory Visit (HOSPITAL_COMMUNITY): Payer: Medicare Other | Admitting: Anesthesiology

## 2014-08-09 ENCOUNTER — Ambulatory Visit (HOSPITAL_COMMUNITY)
Admission: RE | Admit: 2014-08-09 | Discharge: 2014-08-09 | Disposition: A | Discharge status: Home / Self Care | Payer: Medicare Other | Source: Ambulatory Visit | Attending: Urology | Admitting: Urology

## 2014-08-09 ENCOUNTER — Encounter (HOSPITAL_COMMUNITY): Payer: Medicare Other | Admitting: Anesthesiology

## 2014-08-09 ENCOUNTER — Encounter (HOSPITAL_COMMUNITY): Payer: Self-pay | Admitting: *Deleted

## 2014-08-09 ENCOUNTER — Ambulatory Visit (HOSPITAL_COMMUNITY): Payer: Medicare Other

## 2014-08-09 ENCOUNTER — Ambulatory Visit (HOSPITAL_COMMUNITY): Payer: Self-pay | Admitting: *Deleted

## 2014-08-09 ENCOUNTER — Encounter (HOSPITAL_COMMUNITY)
Admission: RE | Disposition: A | Discharge status: Home / Self Care | Payer: Self-pay | Source: Ambulatory Visit | Attending: Urology

## 2014-08-09 DIAGNOSIS — Z885 Allergy status to narcotic agent status: Secondary | ICD-10-CM | POA: Insufficient documentation

## 2014-08-09 DIAGNOSIS — E039 Hypothyroidism, unspecified: Secondary | ICD-10-CM | POA: Insufficient documentation

## 2014-08-09 DIAGNOSIS — N201 Calculus of ureter: Secondary | ICD-10-CM | POA: Insufficient documentation

## 2014-08-09 DIAGNOSIS — I255 Ischemic cardiomyopathy: Secondary | ICD-10-CM | POA: Insufficient documentation

## 2014-08-09 DIAGNOSIS — Z79899 Other long term (current) drug therapy: Secondary | ICD-10-CM | POA: Insufficient documentation

## 2014-08-09 DIAGNOSIS — I5022 Chronic systolic (congestive) heart failure: Secondary | ICD-10-CM | POA: Insufficient documentation

## 2014-08-09 DIAGNOSIS — E785 Hyperlipidemia, unspecified: Secondary | ICD-10-CM | POA: Insufficient documentation

## 2014-08-09 DIAGNOSIS — G4730 Sleep apnea, unspecified: Secondary | ICD-10-CM | POA: Insufficient documentation

## 2014-08-09 DIAGNOSIS — N200 Calculus of kidney: Secondary | ICD-10-CM

## 2014-08-09 DIAGNOSIS — D649 Anemia, unspecified: Secondary | ICD-10-CM | POA: Insufficient documentation

## 2014-08-09 DIAGNOSIS — Z8673 Personal history of transient ischemic attack (TIA), and cerebral infarction without residual deficits: Secondary | ICD-10-CM | POA: Insufficient documentation

## 2014-08-09 DIAGNOSIS — I739 Peripheral vascular disease, unspecified: Secondary | ICD-10-CM | POA: Insufficient documentation

## 2014-08-09 DIAGNOSIS — I252 Old myocardial infarction: Secondary | ICD-10-CM | POA: Insufficient documentation

## 2014-08-09 DIAGNOSIS — M1990 Unspecified osteoarthritis, unspecified site: Secondary | ICD-10-CM | POA: Insufficient documentation

## 2014-08-09 DIAGNOSIS — Z95811 Presence of heart assist device: Secondary | ICD-10-CM

## 2014-08-09 DIAGNOSIS — I10 Essential (primary) hypertension: Secondary | ICD-10-CM | POA: Insufficient documentation

## 2014-08-09 DIAGNOSIS — Z7982 Long term (current) use of aspirin: Secondary | ICD-10-CM | POA: Insufficient documentation

## 2014-08-09 DIAGNOSIS — I2510 Atherosclerotic heart disease of native coronary artery without angina pectoris: Secondary | ICD-10-CM | POA: Insufficient documentation

## 2014-08-09 DIAGNOSIS — Z950 Presence of cardiac pacemaker: Secondary | ICD-10-CM | POA: Insufficient documentation

## 2014-08-09 DIAGNOSIS — N289 Disorder of kidney and ureter, unspecified: Secondary | ICD-10-CM | POA: Insufficient documentation

## 2014-08-09 DIAGNOSIS — Z7901 Long term (current) use of anticoagulants: Secondary | ICD-10-CM | POA: Insufficient documentation

## 2014-08-09 DIAGNOSIS — I472 Ventricular tachycardia: Secondary | ICD-10-CM | POA: Insufficient documentation

## 2014-08-09 DIAGNOSIS — Z87891 Personal history of nicotine dependence: Secondary | ICD-10-CM | POA: Insufficient documentation

## 2014-08-09 HISTORY — PX: CYSTOSCOPY WITH RETROGRADE PYELOGRAM, URETEROSCOPY AND STENT PLACEMENT: SHX5789

## 2014-08-09 LAB — PROTIME-INR
INR: 2.4 — ABNORMAL HIGH (ref 0.00–1.49)
PROTHROMBIN TIME: 26.4 s — AB (ref 11.6–15.2)

## 2014-08-09 LAB — TYPE AND SCREEN
ABO/RH(D): O POS
Antibody Screen: POSITIVE
DAT, IGG: NEGATIVE

## 2014-08-09 LAB — ABO/RH: ABO/RH(D): O POS

## 2014-08-09 SURGERY — CYSTOURETEROSCOPY, WITH RETROGRADE PYELOGRAM AND STENT INSERTION
Anesthesia: General | Laterality: Left

## 2014-08-09 MED ORDER — SODIUM CHLORIDE 0.9 % IR SOLN
Status: DC | PRN
  Administered 2014-08-09: 1000 mL

## 2014-08-09 MED ORDER — LIDOCAINE HCL 2 % EX GEL
CUTANEOUS | Status: AC
  Filled 2014-08-09: qty 10

## 2014-08-09 MED ORDER — SODIUM CHLORIDE 0.9 % IV SOLN: INTRAVENOUS | Status: DC

## 2014-08-09 MED ORDER — ONDANSETRON HCL 4 MG/2ML IJ SOLN
INTRAMUSCULAR | Status: AC
  Filled 2014-08-09: qty 2

## 2014-08-09 MED ORDER — LACTATED RINGERS IV SOLN
INTRAVENOUS | Status: DC | PRN
  Administered 2014-08-09: 08:00:00 via INTRAVENOUS

## 2014-08-09 MED ORDER — PHENYLEPHRINE HCL 10 MG/ML IJ SOLN
INTRAMUSCULAR | Status: DC | PRN
  Administered 2014-08-09 (×2): 80 ug via INTRAVENOUS
  Administered 2014-08-09: 120 ug via INTRAVENOUS

## 2014-08-09 MED ORDER — DEXAMETHASONE SODIUM PHOSPHATE 10 MG/ML IJ SOLN
INTRAMUSCULAR | Status: AC
  Filled 2014-08-09: qty 1

## 2014-08-09 MED ORDER — PHENYLEPHRINE HCL 10 MG/ML IJ SOLN
INTRAMUSCULAR | Status: AC
  Filled 2014-08-09: qty 1

## 2014-08-09 MED ORDER — CISATRACURIUM BESYLATE 20 MG/10ML IV SOLN
INTRAVENOUS | Status: AC
  Filled 2014-08-09: qty 10

## 2014-08-09 MED ORDER — SUCCINYLCHOLINE CHLORIDE 20 MG/ML IJ SOLN
INTRAMUSCULAR | Status: DC | PRN
  Administered 2014-08-09: 100 mg via INTRAVENOUS

## 2014-08-09 MED ORDER — SODIUM CHLORIDE 0.9 % IV SOLN
INTRAVENOUS | Status: DC | PRN
  Administered 2014-08-09: 07:00:00 via INTRAVENOUS

## 2014-08-09 MED ORDER — SODIUM CHLORIDE 0.9 % IR SOLN
Status: DC | PRN
  Administered 2014-08-09: 3000 mL

## 2014-08-09 MED ORDER — PHENYLEPHRINE HCL 10 MG/ML IJ SOLN
10.0000 mg | INTRAVENOUS | Status: DC | PRN
  Administered 2014-08-09: 25 ug/min via INTRAVENOUS

## 2014-08-09 MED ORDER — CEFAZOLIN SODIUM-DEXTROSE 2-3 GM-% IV SOLR: 2.0000 g | INTRAVENOUS | Status: DC

## 2014-08-09 MED ORDER — CEPHALEXIN 250 MG PO CAPS: 250.0000 mg | ORAL_CAPSULE | Freq: Every day | ORAL | Status: DC

## 2014-08-09 MED ORDER — FENTANYL CITRATE 0.05 MG/ML IJ SOLN
INTRAMUSCULAR | Status: DC | PRN
  Administered 2014-08-09: 50 ug via INTRAVENOUS

## 2014-08-09 MED ORDER — ETOMIDATE 2 MG/ML IV SOLN
INTRAVENOUS | Status: DC | PRN
  Administered 2014-08-09: 15 mg via INTRAVENOUS

## 2014-08-09 MED ORDER — LIDOCAINE HCL 2 % EX GEL
CUTANEOUS | Status: DC | PRN
  Administered 2014-08-09: 1 via URETHRAL

## 2014-08-09 MED ORDER — FENTANYL CITRATE 0.05 MG/ML IJ SOLN
INTRAMUSCULAR | Status: AC
  Filled 2014-08-09: qty 2

## 2014-08-09 MED ORDER — ETOMIDATE 2 MG/ML IV SOLN
INTRAVENOUS | Status: AC
  Filled 2014-08-09: qty 10

## 2014-08-09 MED ORDER — IOHEXOL 300 MG/ML  SOLN
INTRAMUSCULAR | Status: DC | PRN
  Administered 2014-08-09: 30 mL

## 2014-08-09 MED ORDER — HYDROCODONE-ACETAMINOPHEN 5-325 MG PO TABS: 1.0000 | ORAL_TABLET | Freq: Four times a day (QID) | ORAL | Status: DC | PRN

## 2014-08-09 MED ORDER — 0.9 % SODIUM CHLORIDE (POUR BTL) OPTIME
TOPICAL | Status: DC | PRN
  Administered 2014-08-09: 1000 mL

## 2014-08-09 MED ORDER — CEFAZOLIN SODIUM-DEXTROSE 2-3 GM-% IV SOLR
INTRAVENOUS | Status: AC
  Filled 2014-08-09: qty 50

## 2014-08-09 MED ORDER — ONDANSETRON HCL 4 MG/2ML IJ SOLN
INTRAMUSCULAR | Status: DC | PRN
  Administered 2014-08-09: 4 mg via INTRAVENOUS

## 2014-08-09 MED ORDER — FENTANYL CITRATE 0.05 MG/ML IJ SOLN: 25.0000 ug | INTRAMUSCULAR | Status: DC | PRN

## 2014-08-09 SURGICAL SUPPLY — 22 items
BAG URINE DRAINAGE (UROLOGICAL SUPPLIES) IMPLANT
BASKET ZERO TIP NITINOL 2.4FR (BASKET) IMPLANT
BSKT STON RTRVL ZERO TP 2.4FR (BASKET)
CATH INTERMIT  6FR 70CM (CATHETERS) IMPLANT
CLOTH BEACON ORANGE TIMEOUT ST (SAFETY) IMPLANT
DRAPE CAMERA CLOSED 9X96 (DRAPES) ×2 IMPLANT
FIBER LASER FLEXIVA 1000 (UROLOGICAL SUPPLIES) IMPLANT
FIBER LASER FLEXIVA 200 (UROLOGICAL SUPPLIES) IMPLANT
FIBER LASER FLEXIVA 365 (UROLOGICAL SUPPLIES) IMPLANT
FIBER LASER FLEXIVA 550 (UROLOGICAL SUPPLIES) IMPLANT
FIBER LASER TRAC TIP (UROLOGICAL SUPPLIES) IMPLANT
GLOVE BIOGEL M STRL SZ7.5 (GLOVE) ×2 IMPLANT
GOWN STRL REUS W/TWL XL LVL3 (GOWN DISPOSABLE) IMPLANT
GUIDEWIRE .038 (WIRE) IMPLANT
GUIDEWIRE ANG ZIPWIRE 038X150 (WIRE) IMPLANT
GUIDEWIRE STR DUAL SENSOR (WIRE) ×2 IMPLANT
IV NS IRRIG 3000ML ARTHROMATIC (IV SOLUTION) IMPLANT
MANIFOLD NEPTUNE II (INSTRUMENTS) ×2 IMPLANT
PACK CYSTO (CUSTOM PROCEDURE TRAY) ×2 IMPLANT
SHEATH ACCESS URETERAL 38CM (SHEATH) ×2 IMPLANT
SHEATH URET ACCESS 12FR/35CM (UROLOGICAL SUPPLIES) ×2 IMPLANT
STENT CONTOUR 7FRX24 (STENTS) ×2 IMPLANT

## 2014-08-09 NOTE — Discharge Instructions (Signed)
[facility] [phone] Post Ureteroscopy With or Without Stent Instructions  Definitions:  Ureter: The duct that transports urine from the kidney to the bladder. Stent:   A plastic hollow tube that is placed into the ureter, from the kidney to the                 bladder to prevent the ureter from swelling shut.  GENERAL INSTRUCTIONS:  Despite the fact that no skin incisions were used, the area around the ureter and bladder is raw and irritated. The stent is a foreign body which will further irritate the bladder wall. This irritation is manifested by increased frequency of urination, both day and night, and by an increase in the urge to urinate. In some, the urge to urinate is present almost always. Sometimes the urge is strong enough that you may not be able to stop yourself from urinating. The only real cure is to remove the stent and then give time for the bladder wall to heal which can't be done until the danger of the ureter swelling shut has passed, which varies.  You may see some blood in your urine while the stent is in place and a few days afterwards. Do not be alarmed, even if the urine was clear for a while. Get off your feet and drink lots of fluids until clearing occurs. If you start to pass clots or don't improve, call us.  DIET: You may return to your normal diet immediately. Because of the raw surface of your bladder, alcohol, spicy foods, acid type foods and drinks with caffeine may cause irritation or frequency and should be used in moderation. To keep your urine flowing freely and to avoid constipation, drink plenty of fluids during the day ( 8-10 glasses ). Tip: Avoid cranberry juice because it is very acidic.  ACTIVITY: Your physical activity doesn't need to be restricted. However, if you are very active, you may see some blood in your urine. We suggest that you reduce your activity under these circumstances until the bleeding has stopped.  BOWELS: It is  important to keep your bowels regular during the postoperative period. Straining with bowel movements can cause bleeding. A bowel movement every other day is reasonable. Use a mild laxative if needed, such as Milk of Magnesia 2-3 tablespoons, or 2 Dulcolax tablets. Call if you continue to have problems. If you have been taking narcotics for pain, before, during or after your surgery, you may be constipated. Take a laxative if necessary.   MEDICATION: You should resume your pre-surgery medications unless told not to. In addition you will often be given an antibiotic to prevent infection. These should be taken as prescribed until the bottles are finished unless you are having an unusual reaction to one of the drugs.  PROBLEMS YOU SHOULD REPORT TO [place]:  Fevers over 100.5 Fahrenheit.  Heavy bleeding, or clots ( See above notes about blood in urine ).  Inability to urinate.  Drug reactions ( hives, rash, nausea, vomiting, diarrhea ).  Severe burning or pain with urination that is not improving.  FOLLOW-UP: You will need a follow-up appointment to monitor your progress. Call for this appointment at the number listed above. Usually the first appointment will be about three to fourteen days after your surgery.  We will call about setting up repeat surgery

## 2014-08-09 NOTE — Op Note (Signed)
Preoperative diagnosis:left UPJ stone Postoperative diagnosis: Same  Procedure: Cystoscopy, left retrograde pyelogram, ureteroscopy, left double-J stent placement   Surgeon: Bernestine Amass M.D.  Anesthesia: Gen.  Indications: [name] is 73 years of age. He has a complicated history is currently a patient in the heart failure clinic with the LVAD device. He is on anticoagulation. He has no significant prior urologic history. He recently developed gross hematuria and was diagnosed with a 13 mm left UPJ stone at the ureteropelvic junction causing mild hydronephrosis. Other than hematuria he has been relatively asymptomatic. We discussed options of treatment with him as well as Dr. Haroldine Laws. We felt the safest way to proceed would be to continue anticoagulation and make an attempt at ureteroscopic treatment. He presents now for that. The heart failure team and a anesthesiologist Dr. [name] from [facility] have a degree to assist with anesthesia and monitoring.     Technique and findings: Patient was brought the operating room where he had successful induction of general anesthesia. Placed in lithotomy position prepped and draped in usual manner. Appropriate surgical timeout was performed. Cystoscopy revealed a normal anterior urethra. 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 The bladder showed no other pathology. The left ureteral orifice was intubated with a open-ended catheter. Retrograde pyelogram did show some narrowing of the ureter in the mid section close to the iliac vessels with some deviation of the ureter. A larger filling defect was noted in the renal pelvis with mild dilation of the calyceal system. Guidewire was placed the left renal pelvis without difficulty. Multiple attempts however to place a ureteral sheath were unsuccessful due to hang up at the mid ureter. I was able to advance a rigid ureteroscope up the distal ureter to the mid ureter but  could not safely proceed beyond that. This may be some scarring and kinking of the ureter related to his previous vascular surgery. I did not feel was safe to continue to make efforts at trying to traverse this area of narrowing. We felt it prudent maneuver was placement of a double-J stent with potentially another try to 4 weeks in hopes of getting some natural ureteral dilation. A 7 French 24 cm double-J stent was placed over the wire. Good positioning was confirmed fluoroscopically as well as visually. The patient was then brought the PACU in stable condition.

## 2014-08-09 NOTE — Anesthesia Postprocedure Evaluation (Signed)
  Anesthesia Post-op Note  Patient: [name]  Procedure(s) Performed: Procedure(s): CYSTOSCOPY WITH RETROGRADE PYELOGRAM, URETEROSCOPY AND STENT PLACEMENT (Left)  Patient Location: PACU  Anesthesia Type:General  Level of Consciousness: awake, alert  and oriented  Airway and Oxygen Therapy: Patient Spontanous Breathing  Post-op Pain: none  Post-op Assessment: Post-op Vital signs reviewed, Patient's Cardiovascular Status Stable, Respiratory Function Stable, Patent Airway, No signs of Nausea or vomiting and Pain level controlled  Post-op Vital Signs: Reviewed and stable  Last Vitals:  Filed Vitals:   08/09/14 1011  BP:   Pulse:   Temp: 36.4 C  Resp:     Complications: No apparent anesthesia complications

## 2014-08-09 NOTE — Interval H&P Note (Signed)
History and Physical Interval Note:  08/09/2014 7:28 AM  [name]  has presented today for surgery, with the diagnosis of LEFT URETEROPELVIC JUNCTION STONE  The various methods of treatment have been discussed with the patient and family. After consideration of risks, benefits and other options for treatment, the patient has consented to  Procedure([name]): CYSTOSCOPY WITH RETROGRADE PYELOGRAM, URETEROSCOPY AND STENT PLACEMENT (Left) HOLMIUM LASER APPLICATION (Left) as a surgical intervention .  The patient'[name] history has been reviewed, patient examined, no change in status, stable for surgery.  I have reviewed the patient'[name] chart and labs.  Questions were answered to the patient'[name] satisfaction.     [name]

## 2014-08-09 NOTE — Progress Notes (Signed)
Dr. Risa Grill notified that patient's urine is very bloody. He says he may go home as planned as long as he voiding without problems

## 2014-08-09 NOTE — Progress Notes (Signed)
Jilda Roche RN here in short stay with patient to manage his cardioverter defibrillator post op

## 2014-08-09 NOTE — Transfer of Care (Signed)
Immediate Anesthesia Transfer of Care Note  Patient: [name]  Procedure(s) Performed: Procedure(s): CYSTOSCOPY WITH RETROGRADE PYELOGRAM, URETEROSCOPY AND STENT PLACEMENT (Left)  Patient Location: PACU  Anesthesia Type:General  Level of Consciousness: awake, alert  and patient cooperative  Airway & Oxygen Therapy: Patient Spontanous Breathing and Patient connected to face mask oxygen  Post-op Assessment: Report given to PACU RN and Post -op Vital signs reviewed and stable  Post vital signs: Reviewed and stable  Complications: No apparent anesthesia complications

## 2014-08-09 NOTE — Progress Notes (Signed)
VAD coordinator met patient in short stay pre cystoscopy/ureteroscopy. Pt awake, alert, [name] at bedside. Transported patient to holding area, anesthesia present, A-line placed along with PIV.  Dr. Risa Grill met with pt and explained procedure. VAD coordinator remained with patient pre, intra, and post op; VAD parameters as below:  Time:  MAP:  HR:  Flow:  PI:  Power: Speed  Pre procedure: 07:30  110  75  5.2  7.0  5.5  9200   07:50  100  74  5.2  7.0  5.5    Intra: 07:55  96  74  5.1  5.9  5.5   08:00  78  74  4.7   08:05 08:10 08:15 08:20 08:30 08:40 08:50 09:00

## 2014-08-09 NOTE — Anesthesia Preprocedure Evaluation (Addendum)
Anesthesia Evaluation  Patient identified by MRN, date of birth, ID band Patient awake    Reviewed: Allergy & Precautions, H&P , NPO status , Patient's Chart, lab work & pertinent test results  History of Anesthesia Complications Negative for: history of anesthetic complications  Airway Mallampati: I TM Distance: >3 FB Neck ROM: Full    Dental  (+) Edentulous Upper, Edentulous Lower   Pulmonary sleep apnea , former smoker,  breath sounds clear to auscultation        Cardiovascular hypertension, - angina+ CAD, + Past MI, + Peripheral Vascular Disease and +CHF + dysrhythmias Atrial Fibrillation and Ventricular Tachycardia + Cardiac Defibrillator Rhythm:Regular  [employer] Pacer/ICD, A paced V sensed >90%, no shocks, sinus today, LVAD Heart mate 2 9200 rpm   Neuro/Psych TIACVA, No Residual Symptoms    GI/Hepatic negative GI ROS, Neg liver ROS,   Endo/Other  Hypothyroidism   Renal/GU Renal InsufficiencyRenal disease     Musculoskeletal  (+) Arthritis -, Osteoarthritis,    Abdominal   Peds  Hematology  (+) anemia , Coumadin held 10/4 pm, INR adequate on last check   Anesthesia Other Findings   Reproductive/Obstetrics                          Anesthesia Physical Anesthesia Plan  ASA: IV  Anesthesia Plan: General   Post-op Pain Management:    Induction: Intravenous  Airway Management Planned: Oral ETT  Additional Equipment: Arterial line  Intra-op Plan:   Post-operative Plan: Extubation in OR  Informed Consent: I have reviewed the patients History and Physical, chart, labs and discussed the procedure including the risks, benefits and alternatives for the proposed anesthesia with the patient or authorized representative who has indicated his/her understanding and acceptance.     Plan Discussed with: CRNA and Surgeon  Anesthesia Plan Comments:         Anesthesia Quick Evaluation

## 2014-08-09 NOTE — Progress Notes (Signed)
VAD coordinator met patient in short stay pre cystoscopy/uteroscopy. Pt awake, alert, [name] at bedside. Transported patient to holding area, anesthesia present, A-line placed along with PIV. Dr. Risa Grill met with pt and explained procedure. VAD coordinator remained with patient pre, intra, and post op; VAD parameters as below:  Speed:  9200 Flow:  5.1 Power:  5.5  PI: 7.1 Alarms: multiple low voltage advisories  Events:  10 - 20 PI events; 50 PI events on 08/03/14  Fixed speed:  9200 Low speed limit:  8600  Pt has black bag with back up controller and extra batteries and clips at bedside.  Time:  MAP:  HR:  Flow:  PI:  Power: Speed:  Pre procedure: 07:30  110  75  5.2  7.0  5.5  9200 07:50  100  74  5.2  7.0  5.5   Intra: 07:55  96  74  5.1  5.9  5.5 08:00  78  74  4.7  2.8  5.4 08:05  71  74  4.7  2.7  5.3  9200 08:10  92  74  4.4  4.9  5.2 08:15  95  74  4.5  5.4  5.3 08:20  97  75  4.5  5.5  5.2 08:30  97  74  4.5  5.6  5.2 08:40  107  74  4.8  6.6  5.5  Post: 08:50  89  75  4.8  6.6  5.5 09:00  91  75  5.1  6.9  5.4  9200  09:30  80  75  5.2  6.3  5.5   Short Stay: 10:00  90  74  5.0  5.4  5.5  9200 11:00  90  75  4.7  5.9  5.4 12:00  88  75  4.8  6.1  5.5  9200  Pt discharged home with [name].

## 2014-08-10 ENCOUNTER — Encounter (HOSPITAL_COMMUNITY): Payer: Self-pay | Admitting: Urology

## 2014-08-11 ENCOUNTER — Encounter: Payer: Self-pay | Admitting: Internal Medicine

## 2014-08-15 ENCOUNTER — Other Ambulatory Visit (HOSPITAL_COMMUNITY): Payer: Self-pay | Admitting: Internal Medicine

## 2014-08-16 ENCOUNTER — Other Ambulatory Visit: Payer: Self-pay | Admitting: Urology

## 2014-08-16 ENCOUNTER — Ambulatory Visit (HOSPITAL_COMMUNITY): Payer: Self-pay | Admitting: *Deleted

## 2014-08-16 ENCOUNTER — Other Ambulatory Visit (HOSPITAL_COMMUNITY): Payer: Self-pay | Admitting: *Deleted

## 2014-08-16 ENCOUNTER — Other Ambulatory Visit (HOSPITAL_COMMUNITY): Payer: Self-pay | Admitting: Internal Medicine

## 2014-08-16 DIAGNOSIS — E785 Hyperlipidemia, unspecified: Secondary | ICD-10-CM

## 2014-08-16 DIAGNOSIS — I509 Heart failure, unspecified: Secondary | ICD-10-CM

## 2014-08-16 DIAGNOSIS — I25810 Atherosclerosis of coronary artery bypass graft(s) without angina pectoris: Secondary | ICD-10-CM

## 2014-08-16 LAB — PROTIME-INR: INR: 1.8 — AB (ref <=1.1)

## 2014-08-16 MED ORDER — PANTOPRAZOLE SODIUM 40 MG PO TBEC: 40.0000 mg | DELAYED_RELEASE_TABLET | ORAL | Status: DC

## 2014-08-17 LAB — PROTIME-INR
INR: 1.8 — ABNORMAL HIGH (ref 0.8–1.2)
Prothrombin Time: 19.9 s — ABNORMAL HIGH (ref 9.1–12.0)

## 2014-08-17 LAB — BASIC METABOLIC PANEL
BUN / CREAT RATIO: 13 (ref 10–22)
BUN: 22 mg/dL (ref 8–27)
CHLORIDE: 101 mmol/L (ref 97–108)
CO2: 22 mmol/L (ref 18–29)
Calcium: 8.7 mg/dL (ref 8.6–10.2)
Creatinine, Ser: 1.63 mg/dL — ABNORMAL HIGH (ref 0.76–1.27)
GFR calc Af Amer: 47 mL/min/{1.73_m2} — ABNORMAL LOW (ref >59)
GFR calc non Af Amer: 41 mL/min/{1.73_m2} — ABNORMAL LOW (ref >59)
Glucose: 94 mg/dL (ref 65–99)
Potassium: 4.8 mmol/L (ref 3.5–5.2)
Sodium: 139 mmol/L (ref 134–144)

## 2014-08-17 LAB — AMBIG ABBREV BMP8 DEFAULT

## 2014-08-23 ENCOUNTER — Other Ambulatory Visit (HOSPITAL_COMMUNITY): Payer: Self-pay | Admitting: *Deleted

## 2014-08-23 NOTE — Progress Notes (Signed)
Spoke with Zada Girt, RN for Dr. Haroldine Laws and requested labs drawn morning of surgery -CBC, BMET, PT/INR, LDH. She had already spoke to Dr. Laurie Panda who will be here for procedure from Anesthesia . Cloyde Reams stated that the [employer] Representative did not need to be called to come here for procedure as if they needed to ,Anesthesia could use a magnet.

## 2014-08-24 ENCOUNTER — Telehealth (HOSPITAL_COMMUNITY): Payer: Self-pay | Admitting: *Deleted

## 2014-08-24 NOTE — Telephone Encounter (Signed)
Called [name] re: missed INR appt yesterday (pt unaware of appt); will go tomorrow.

## 2014-08-25 ENCOUNTER — Other Ambulatory Visit (HOSPITAL_COMMUNITY): Payer: Self-pay | Admitting: Internal Medicine

## 2014-08-25 LAB — PROTIME-INR: INR: 3.4 — AB (ref 0.9–1.1)

## 2014-08-26 ENCOUNTER — Ambulatory Visit (HOSPITAL_COMMUNITY): Payer: Self-pay | Admitting: *Deleted

## 2014-08-26 LAB — PROTIME-INR
INR: 3.4 — ABNORMAL HIGH (ref 0.8–1.2)
PROTHROMBIN TIME: 37.7 s — AB (ref 9.1–12.0)

## 2014-08-29 IMAGING — US US THORACENTESIS ASP PLEURAL SPACE W/IMG GUIDE
1 series · 5 of 5 positions shown · non-contrast
Comparison: None.

CLINICAL DATA: Post LVAD procedure, shortness of breath. Left-sided
pleural effusion. Request diagnostic and therapeutic thoracentesis

EXAM:
ULTRASOUND GUIDED left THORACENTESIS

[Series 1: us thoracentesis asp pleural space w/img guide · 0.27mm/px · 5 of 5 slices shown]
[im 1/5]
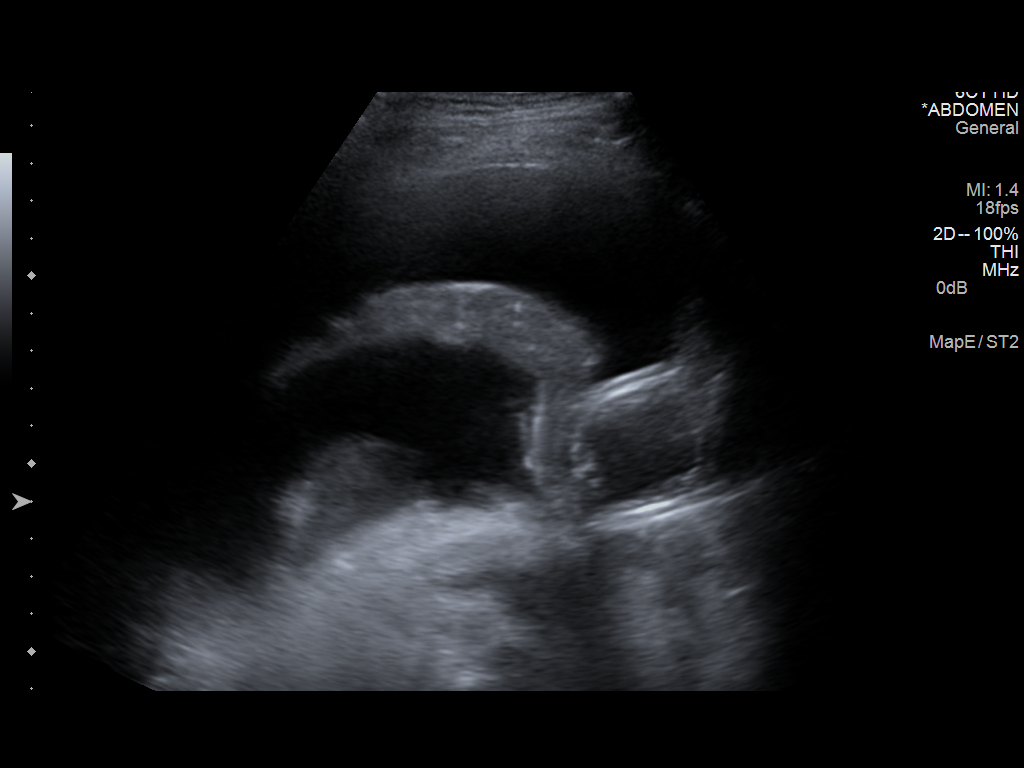
[im 2/5]
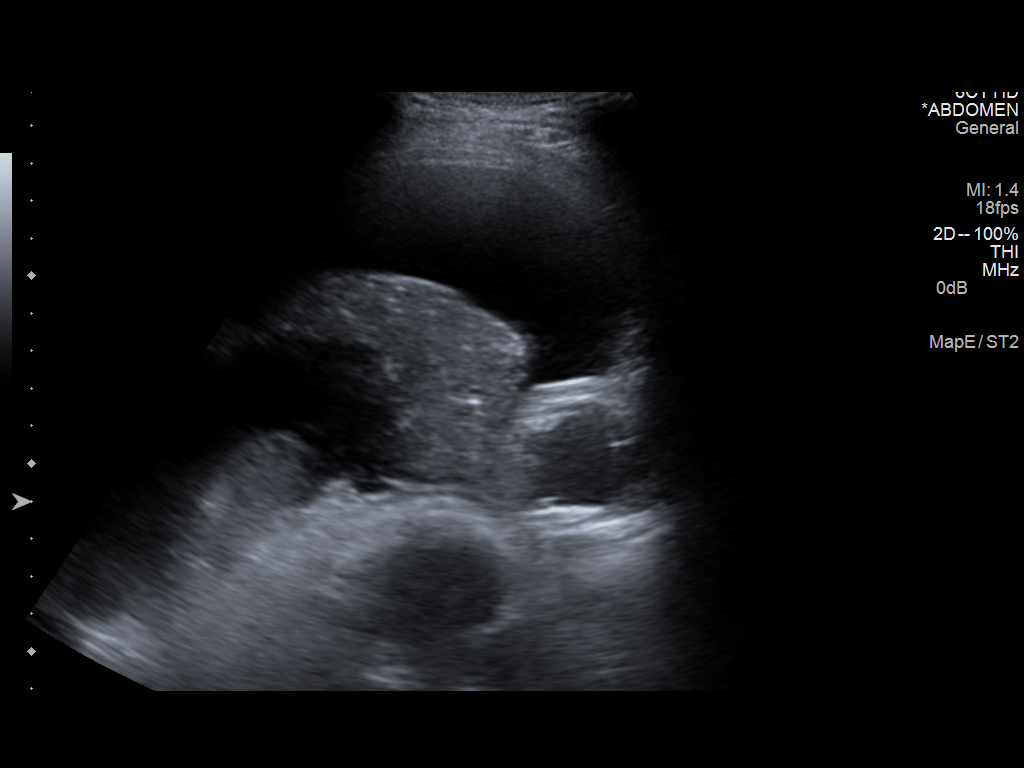
[im 3/5]
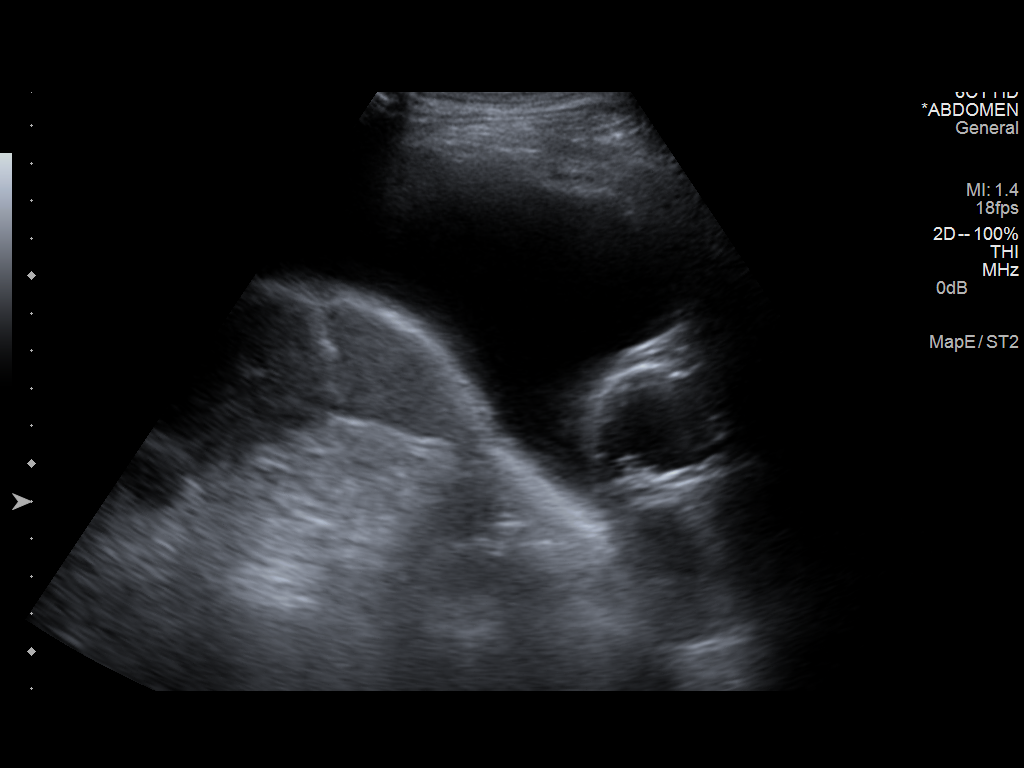
[im 4/5]
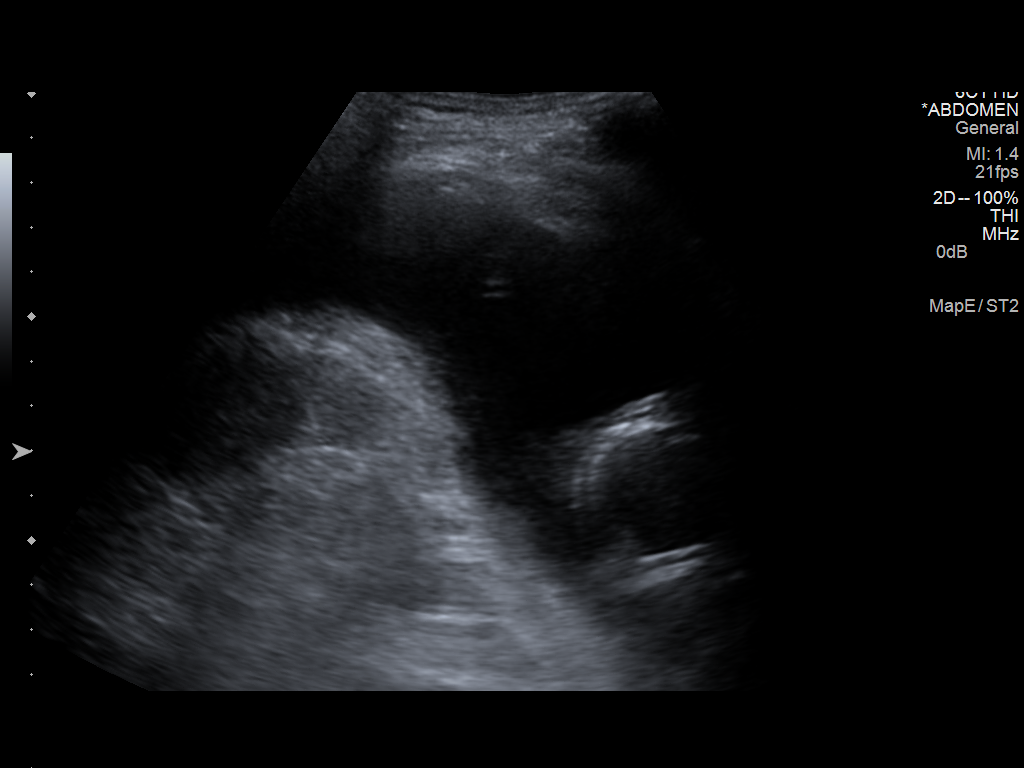
[im 5/5]
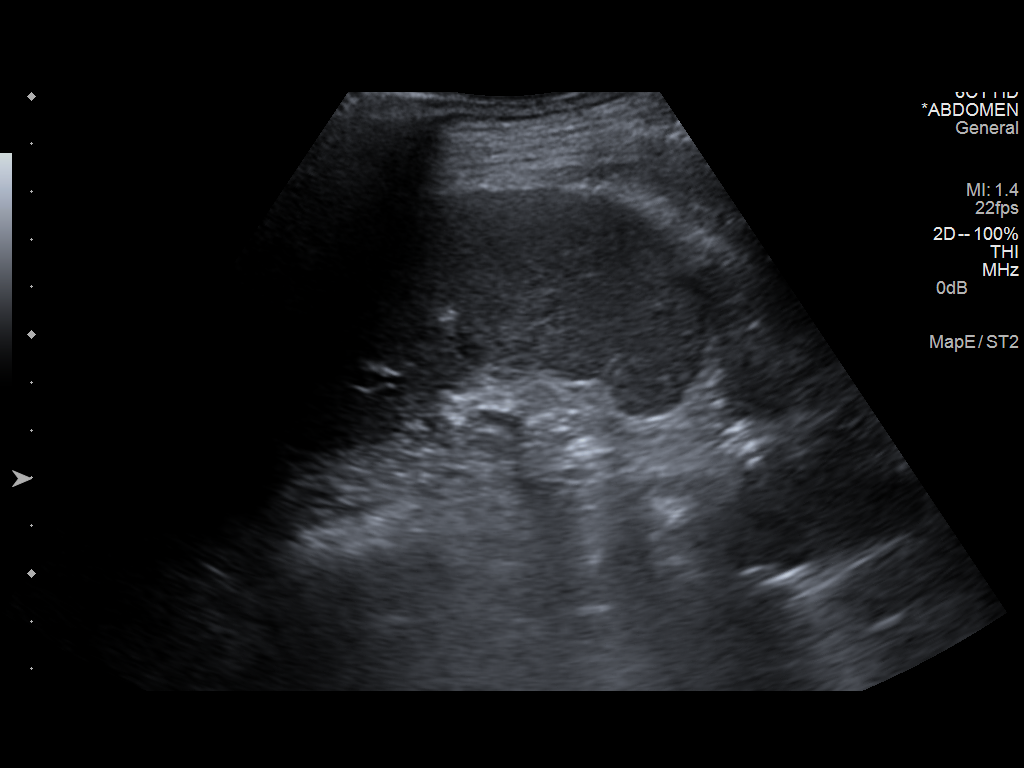

[5 of 5 positions shown; findings below may reference images not displayed]

FINDINGS: A total of approximately 1.5 L of amber/ blood-tinged fluid was
removed. A fluid sample wassent for laboratory analysis.
IMPRESSION: Successful ultrasound guided left thoracentesis yielding 1.5 L of
pleural fluid.

PROCEDURE:
An ultrasound guided thoracentesis was thoroughly discussed with the
patient and questions answered. The benefits, risks, alternatives
and complications were also discussed. The patient understands and
wishes to proceed with the procedure. Written consent was obtained.

Ultrasound was performed to localize and mark an adequate pocket of
fluid in the left chest. The area was then prepped and draped in the
normal sterile fashion. 1% Lidocaine was used for local anesthesia.
Under ultrasound guidance a 19 gauge Yueh catheter was introduced.
Thoracentesis was performed. The catheter was removed and a dressing
applied.

Complications:  None immediate

## 2014-08-29 IMAGING — CR DG CHEST 1V PORT
1 series · 1 of 1 positions shown · non-contrast
Comparison: Chest x-ray 11/04/2013.

CLINICAL DATA: Status post left ventricular assist device
placement.

EXAM:
PORTABLE CHEST - 1 VIEW

[AP]
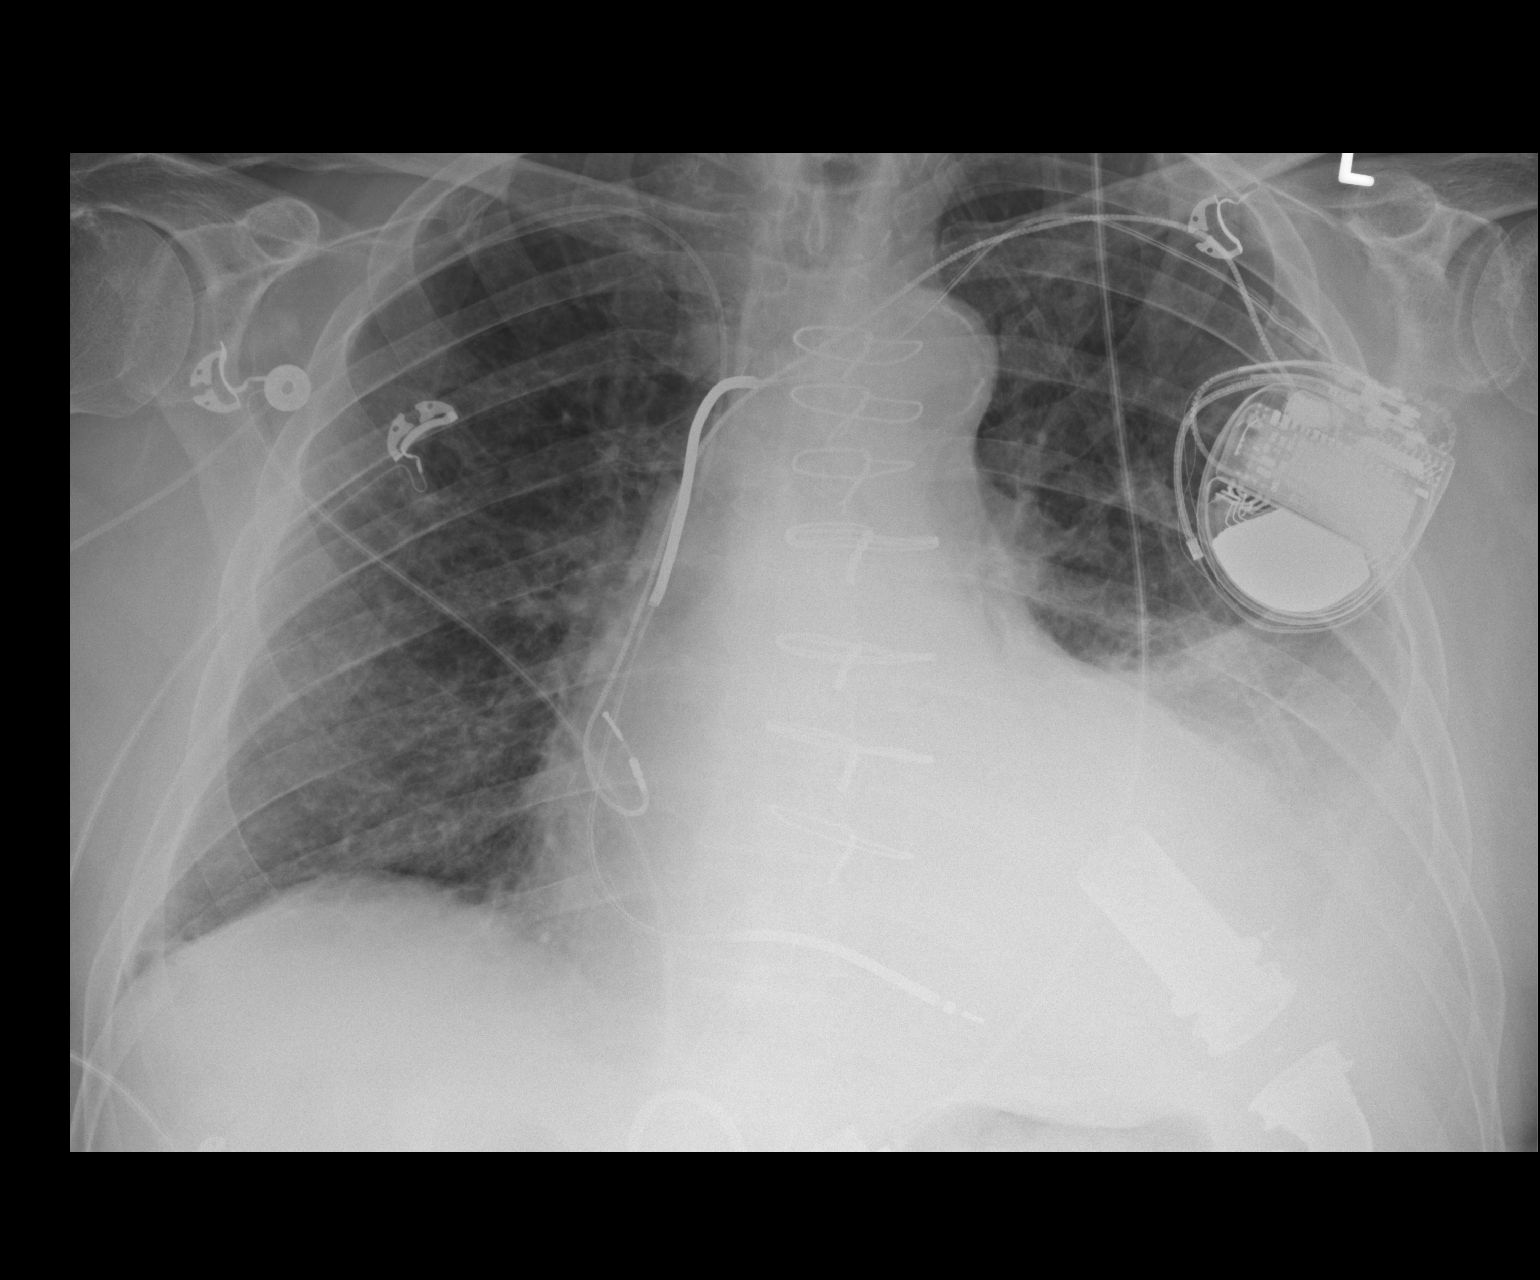

[1 of 1 positions shown; findings below may reference images not displayed]

FINDINGS: An LVAD is again noted projecting over the lower thorax and upper
abdomen. The inflow cannula appears oriented along the left
ventricular long axis, with slight cephalad angulation. Lung volumes
are normal. Persistent opacity at the base of the left hemithorax
may reflect atelectasis and/or consolidation, with superimposed
moderate left pleural effusion. Right lung appears clear. Pulmonary
venous congestion, without frank pulmonary edema. Heart size remains
enlarged. Atherosclerosis in the thoracic aorta. Left-sided
pacemaker/AICD with lead tips projecting over the expected location
of the right atrium and right ventricular apex. There is a right
upper extremity PICC with tip terminating in the mid superior vena
cava. Status post median sternotomy.
IMPRESSION: 1. Overall, allowing for slight differences in patient positioning,
the radiographic appearance the chest is essentially unchanged, as
detailed above.

## 2014-09-01 ENCOUNTER — Encounter (HOSPITAL_COMMUNITY)
Admission: RE | Disposition: A | Discharge status: Home / Self Care | Payer: Self-pay | Source: Ambulatory Visit | Attending: Urology

## 2014-09-01 ENCOUNTER — Encounter (HOSPITAL_COMMUNITY): Payer: Self-pay | Admitting: *Deleted

## 2014-09-01 ENCOUNTER — Ambulatory Visit (HOSPITAL_COMMUNITY): Payer: Medicare Other | Admitting: Anesthesiology

## 2014-09-01 ENCOUNTER — Ambulatory Visit (HOSPITAL_COMMUNITY)
Admission: RE | Admit: 2014-09-01 | Discharge: 2014-09-01 | Disposition: A | Discharge status: Home / Self Care | Payer: Medicare Other | Source: Ambulatory Visit | Attending: Urology | Admitting: Urology

## 2014-09-01 ENCOUNTER — Ambulatory Visit (HOSPITAL_COMMUNITY): Payer: Self-pay | Admitting: *Deleted

## 2014-09-01 ENCOUNTER — Ambulatory Visit (HOSPITAL_COMMUNITY): Payer: Medicare Other

## 2014-09-01 ENCOUNTER — Encounter (HOSPITAL_COMMUNITY): Payer: Medicare Other | Admitting: Anesthesiology

## 2014-09-01 DIAGNOSIS — Z466 Encounter for fitting and adjustment of urinary device: Secondary | ICD-10-CM | POA: Diagnosis not present

## 2014-09-01 DIAGNOSIS — I255 Ischemic cardiomyopathy: Secondary | ICD-10-CM

## 2014-09-01 DIAGNOSIS — E039 Hypothyroidism, unspecified: Secondary | ICD-10-CM | POA: Diagnosis not present

## 2014-09-01 DIAGNOSIS — I10 Essential (primary) hypertension: Secondary | ICD-10-CM | POA: Diagnosis not present

## 2014-09-01 DIAGNOSIS — E785 Hyperlipidemia, unspecified: Secondary | ICD-10-CM | POA: Diagnosis not present

## 2014-09-01 DIAGNOSIS — I2510 Atherosclerotic heart disease of native coronary artery without angina pectoris: Secondary | ICD-10-CM | POA: Diagnosis not present

## 2014-09-01 DIAGNOSIS — Z95811 Presence of heart assist device: Secondary | ICD-10-CM

## 2014-09-01 DIAGNOSIS — I739 Peripheral vascular disease, unspecified: Secondary | ICD-10-CM | POA: Diagnosis not present

## 2014-09-01 DIAGNOSIS — N200 Calculus of kidney: Secondary | ICD-10-CM | POA: Diagnosis present

## 2014-09-01 DIAGNOSIS — I5022 Chronic systolic (congestive) heart failure: Secondary | ICD-10-CM | POA: Diagnosis not present

## 2014-09-01 DIAGNOSIS — R001 Bradycardia, unspecified: Secondary | ICD-10-CM | POA: Diagnosis not present

## 2014-09-01 DIAGNOSIS — I472 Ventricular tachycardia: Secondary | ICD-10-CM | POA: Insufficient documentation

## 2014-09-01 DIAGNOSIS — Z85820 Personal history of malignant melanoma of skin: Secondary | ICD-10-CM | POA: Diagnosis not present

## 2014-09-01 DIAGNOSIS — Z7901 Long term (current) use of anticoagulants: Secondary | ICD-10-CM | POA: Insufficient documentation

## 2014-09-01 DIAGNOSIS — I252 Old myocardial infarction: Secondary | ICD-10-CM | POA: Diagnosis not present

## 2014-09-01 DIAGNOSIS — Z950 Presence of cardiac pacemaker: Secondary | ICD-10-CM | POA: Insufficient documentation

## 2014-09-01 DIAGNOSIS — Z87891 Personal history of nicotine dependence: Secondary | ICD-10-CM | POA: Insufficient documentation

## 2014-09-01 DIAGNOSIS — Z8673 Personal history of transient ischemic attack (TIA), and cerebral infarction without residual deficits: Secondary | ICD-10-CM | POA: Diagnosis not present

## 2014-09-01 HISTORY — PX: HOLMIUM LASER APPLICATION: SHX5852

## 2014-09-01 HISTORY — PX: CYSTOSCOPY WITH RETROGRADE PYELOGRAM, URETEROSCOPY AND STENT PLACEMENT: SHX5789

## 2014-09-01 LAB — BASIC METABOLIC PANEL
ANION GAP: 12 (ref 5–15)
BUN: 26 mg/dL — ABNORMAL HIGH (ref 6–23)
CALCIUM: 8.6 mg/dL (ref 8.4–10.5)
CO2: 24 mEq/L (ref 19–32)
Chloride: 105 mEq/L (ref 96–112)
Creatinine, Ser: 1.49 mg/dL — ABNORMAL HIGH (ref 0.50–1.35)
GFR calc Af Amer: 52 mL/min — ABNORMAL LOW (ref >90)
GFR calc non Af Amer: 44 mL/min — ABNORMAL LOW (ref >90)
Glucose, Bld: 105 mg/dL — ABNORMAL HIGH (ref 70–99)
Potassium: 4 mEq/L (ref 3.7–5.3)
SODIUM: 141 meq/L (ref 137–147)

## 2014-09-01 LAB — TYPE AND SCREEN
ABO/RH(D): O POS
ANTIBODY SCREEN: NEGATIVE

## 2014-09-01 LAB — CBC
HCT: 27 % — ABNORMAL LOW (ref 39.0–52.0)
HEMOGLOBIN: 8.6 g/dL — AB (ref 13.0–17.0)
MCH: 31.3 pg (ref 26.0–34.0)
MCHC: 31.9 g/dL (ref 30.0–36.0)
MCV: 98.2 fL (ref 78.0–100.0)
Platelets: 151 10*3/uL (ref 150–400)
RBC: 2.75 MIL/uL — ABNORMAL LOW (ref 4.22–5.81)
RDW: 17.4 % — AB (ref 11.5–15.5)
WBC: 3.9 10*3/uL — AB (ref 4.0–10.5)

## 2014-09-01 LAB — LACTATE DEHYDROGENASE: LDH: 244 U/L (ref 94–250)

## 2014-09-01 LAB — PROTIME-INR
INR: 2.38 — AB (ref 0.00–1.49)
PROTHROMBIN TIME: 26.2 s — AB (ref 11.6–15.2)

## 2014-09-01 SURGERY — CYSTOURETEROSCOPY, WITH RETROGRADE PYELOGRAM AND STENT INSERTION
Anesthesia: General | Laterality: Left

## 2014-09-01 MED ORDER — FENTANYL CITRATE 0.05 MG/ML IJ SOLN
INTRAMUSCULAR | Status: AC
  Filled 2014-09-01: qty 2

## 2014-09-01 MED ORDER — ETOMIDATE 2 MG/ML IV SOLN
INTRAVENOUS | Status: AC
  Filled 2014-09-01: qty 10

## 2014-09-01 MED ORDER — PHENYLEPHRINE HCL 10 MG/ML IJ SOLN
INTRAMUSCULAR | Status: AC
  Filled 2014-09-01: qty 1

## 2014-09-01 MED ORDER — 0.9 % SODIUM CHLORIDE (POUR BTL) OPTIME
TOPICAL | Status: DC | PRN
  Administered 2014-09-01: 1000 mL

## 2014-09-01 MED ORDER — BELLADONNA ALKALOIDS-OPIUM 16.2-60 MG RE SUPP
RECTAL | Status: AC
  Filled 2014-09-01: qty 1

## 2014-09-01 MED ORDER — PROPOFOL 10 MG/ML IV BOLUS
INTRAVENOUS | Status: AC
  Filled 2014-09-01: qty 20

## 2014-09-01 MED ORDER — ONDANSETRON HCL 4 MG/2ML IJ SOLN
INTRAMUSCULAR | Status: AC
Start: 2014-09-01 — End: 2014-09-01
  Filled 2014-09-01: qty 2

## 2014-09-01 MED ORDER — ETOMIDATE 2 MG/ML IV SOLN
INTRAVENOUS | Status: DC | PRN
  Administered 2014-09-01: 14 mg via INTRAVENOUS

## 2014-09-01 MED ORDER — LIDOCAINE HCL (CARDIAC) 20 MG/ML IV SOLN
INTRAVENOUS | Status: DC | PRN
  Administered 2014-09-01: 50 mg via INTRAVENOUS

## 2014-09-01 MED ORDER — SODIUM CHLORIDE 0.9 % IR SOLN
Status: DC | PRN
  Administered 2014-09-01: 5000 mL

## 2014-09-01 MED ORDER — FENTANYL CITRATE 0.05 MG/ML IJ SOLN
INTRAMUSCULAR | Status: DC | PRN
  Administered 2014-09-01 (×2): 25 ug via INTRAVENOUS
  Administered 2014-09-01: 50 ug via INTRAVENOUS

## 2014-09-01 MED ORDER — PROPOFOL 10 MG/ML IV BOLUS
INTRAVENOUS | Status: DC | PRN
  Administered 2014-09-01: 10 mg via INTRAVENOUS
  Administered 2014-09-01: 20 mg via INTRAVENOUS

## 2014-09-01 MED ORDER — LACTATED RINGERS IV SOLN
INTRAVENOUS | Status: DC | PRN
  Administered 2014-09-01: 08:00:00 via INTRAVENOUS

## 2014-09-01 MED ORDER — FENTANYL CITRATE 0.05 MG/ML IJ SOLN: 25.0000 ug | INTRAMUSCULAR | Status: DC | PRN

## 2014-09-01 MED ORDER — LIDOCAINE HCL 2 % EX GEL
CUTANEOUS | Status: AC
  Filled 2014-09-01: qty 10

## 2014-09-01 MED ORDER — SUCCINYLCHOLINE CHLORIDE 20 MG/ML IJ SOLN
INTRAMUSCULAR | Status: DC | PRN
  Administered 2014-09-01: 100 mg via INTRAVENOUS

## 2014-09-01 MED ORDER — CEFAZOLIN SODIUM-DEXTROSE 2-3 GM-% IV SOLR
INTRAVENOUS | Status: AC
  Filled 2014-09-01: qty 50

## 2014-09-01 MED ORDER — DEXTROSE 5 % IV SOLN
10.0000 mg | INTRAVENOUS | Status: DC | PRN
  Administered 2014-09-01: 5 ug/min via INTRAVENOUS

## 2014-09-01 MED ORDER — LACTATED RINGERS IV SOLN: INTRAVENOUS | Status: DC

## 2014-09-01 MED ORDER — LIDOCAINE HCL 2 % EX GEL
CUTANEOUS | Status: DC | PRN
  Administered 2014-09-01: 1

## 2014-09-01 MED ORDER — PHENYLEPHRINE 40 MCG/ML (10ML) SYRINGE FOR IV PUSH (FOR BLOOD PRESSURE SUPPORT)
PREFILLED_SYRINGE | INTRAVENOUS | Status: AC
  Filled 2014-09-01: qty 10

## 2014-09-01 MED ORDER — CEFAZOLIN SODIUM-DEXTROSE 2-3 GM-% IV SOLR
2.0000 g | INTRAVENOUS | Status: AC
  Administered 2014-09-01: 2 g via INTRAVENOUS

## 2014-09-01 MED ORDER — IOHEXOL 300 MG/ML  SOLN
INTRAMUSCULAR | Status: DC | PRN
  Administered 2014-09-01: 8 mL

## 2014-09-01 MED ORDER — FUROSEMIDE 10 MG/ML IJ SOLN
INTRAMUSCULAR | Status: AC
  Administered 2014-09-01: 20 mg via INTRAMUSCULAR
  Filled 2014-09-01: qty 4

## 2014-09-01 SURGICAL SUPPLY — 19 items
BAG URINE DRAINAGE (UROLOGICAL SUPPLIES) ×2 IMPLANT
BASKET LASER NITINOL 1.9FR (BASKET) ×2 IMPLANT
BASKET ZERO TIP NITINOL 2.4FR (BASKET) ×2 IMPLANT
BSKT STON RTRVL 120 1.9FR (BASKET) ×1
BSKT STON RTRVL ZERO TP 2.4FR (BASKET) ×1
CATH INTERMIT  6FR 70CM (CATHETERS) IMPLANT
CATH ROBINSON RED A/P 16FR (CATHETERS) ×2 IMPLANT
CLOTH BEACON ORANGE TIMEOUT ST (SAFETY) ×2 IMPLANT
DRAPE CAMERA CLOSED 9X96 (DRAPES) ×2 IMPLANT
FIBER LASER FLEXIVA 200 (UROLOGICAL SUPPLIES) ×2 IMPLANT
FIBER LASER TRAC TIP (UROLOGICAL SUPPLIES) ×2 IMPLANT
GLOVE BIOGEL M STRL SZ7.5 (GLOVE) ×2 IMPLANT
GOWN STRL REUS W/TWL XL LVL3 (GOWN DISPOSABLE) IMPLANT
GUIDEWIRE .038 (WIRE) IMPLANT
GUIDEWIRE ANG ZIPWIRE 038X150 (WIRE) IMPLANT
GUIDEWIRE STR DUAL SENSOR (WIRE) ×2 IMPLANT
IV NS IRRIG 3000ML ARTHROMATIC (IV SOLUTION) ×2 IMPLANT
PACK CYSTO (CUSTOM PROCEDURE TRAY) ×2 IMPLANT
SHEATH ACCESS URETERAL 38CM (SHEATH) ×2 IMPLANT

## 2014-09-01 NOTE — Transfer of Care (Signed)
Immediate Anesthesia Transfer of Care Note  Patient: [name]  Procedure(s) Performed: Procedure(s): CYSTOSCOPY WITH URETEROSCOPY AND STENT REMOVAL (Left) HOLMIUM LASER APPLICATION (Left)  Patient Location: PACU  Anesthesia Type:General  Level of Consciousness: awake, sedated and patient cooperative  Airway & Oxygen Therapy: Patient Spontanous Breathing and Patient connected to face mask oxygen  Post-op Assessment: Report given to PACU RN and Post -op Vital signs reviewed and stable  Post vital signs: Reviewed and stable  Complications: No apparent anesthesia complications

## 2014-09-01 NOTE — Progress Notes (Signed)
Symptom  Yes  No  Details   Angina       x Activity:   Claudication        x How far:   Syncope        x When:     Stroke        x        TIA  2001  Orthopnea         x How many pillows: 1  PND         x How often:  CPAP       N/A How many hrs:   Pedal edema        x   increased lower extremity swelling over last 3 - 4 days  Abd fullness               x    N&V        x         1 x weekly  Diaphoresis               x   Bleeding              x   Urine color        Intermittent bloody urine  SOB        x      Activity: walking to short stay today  Palpitations        x When:  ICD shock        x   Hospitlizaitons             x When/where/why:   ED visit        x When/where/why:  Other MD              x When/who/why:   Activity        Working 5 hrs/5 days week; walking 2 miles day  Fluid    No limitations  < 2000   Diet    Low salt food   Vital signs: HR: 74 MAP BP:  Cuff: 123/98 (108);  A-line:  130/99 (114) O2 Sat:  100 Home weights:  196 - 197 lbs over last 3 - 5 days (baseline 192-193 lbs) Ht: 6'1"  LVAD interrogation reveals:  Speed:   9200 RPM Flow:  5.0 Power:   5.6 PI:  7.2 Alarms:  One low voltage Events:  5 - 10 PI events;  10/22 - 18 PI, 10/14 - 19 PI;  10/12 - 18 PI events Fixed speed:  9200 Low speed limit:  8600 Primary Controller:  Replace back up battery in 22 months  (Feb 2017) Back up controller:   Replace back up battery in 22 months (Feb 2017)   VAD dressing dry and intact; driveline incorporated, the velour is fully implanted at exit site. No erythema, tenderness, drainage, or foul odor noted. Drive line anchor in place. Pt denies fever or chills. Driveline dressing is being changed every week per sterile technique using Sorbaview dressing.   Pt/caregiver deny any alarms or VAD equipment issues. LVAD equipment check completed and is in good working order. Back-up equipment present. Emergency procedures and precautions reviewed.  Home weights are up  with increase lower extremity edema; Dr. Haroldine Laws notified; asked Dr. [name] to give 20 - 40 mg IV lasix at completion of procedure. Hgb 8.6, T&S ordered and transfuse is indicated.     BP(cuff)  BP(A-Line)  HR  Flow  PI  Power  Speed  Pre procedure 8:15   105/89 (92)       5.0  7.2  5.6  9200 8:45   108/86 (91)       5.2  7.5  5.5  9200  Intra op 9:00   123/98 (108)  130/99 (114)  74  4.8  7.2  5.3  9200 9:15   117/89 (98)  103/89 (99)  74  4.9  6.5  5.3  9200 9:30      91/80 (83)  74  4.9  4.5  5.4  9200 9:45                  (84)  74  4.6  4.6  5.3  9200 10:00       (80)  74  4.6  4.5  5.3  9200 10:15       (85)  74  4.6  4.6  5.3  9200 10:30       (87)  74  4.8  4.9  5.3  9200 20 mg IV lasix 10:45       (86)  74  4.7  4.9  5.3  9200  Post procedure 11:00       (97)  75  4.8  7.6  5.3  9200   11:15   110/87 (93)  106/78 (91)  75  4.7  7.4  5.3  9200  Pt awake, no VAD issues.  Placed on batteries and transferred to short stay; planned discharge home after void.  VAD pager information given to PACU nurse with instructions to call if any issues. [name] (trained caregiver) available if any VAD equipment questions. Black bag with back up controller and extra batteries with patient.   Will ask patient to resume coumadin this evening and have INR and CBC checked Monday per Dr. Haroldine Laws. Instructed [name] to call VAD pager if any issues - she verbalized agreement to do same.

## 2014-09-01 NOTE — Op Note (Signed)
Preoperative diagnosis: Left renal calculus Postoperative diagnosis: Same  Procedure: Cystoscopy, removal of left double-J, flexible ureteroscopy with holmium laser lithotripsy and basket extraction of numerous fragments.   Surgeon: Bernestine Amass M.D.  Anesthesia: Gen.  Indications: [name] is a 73 year old male with a very complicated history. 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 He is also on chronic anticoagulation. He was noted recently to have a 12 mm stone causing obstruction at the left ureteral pelvic junction. He presented several weeks ago for an attempt at definitive management. At that time we found his ureter so tight that we were unable to place an access sheath. After numerous attempts we ended up placing a double-J stent and planning on returning in several weeks to see if we could better access the left ureter. He presents now for another trial of definitive management. Anesthesia services from [facility] along with nurses from the heart failure team are here for assistance.     Technique and findings: Patient was brought to the operating room we has successful induction of general anesthesia. He was placed in lithotomy position and prepped and draped in usual manner. Appropriate surgical timeout was performed. Cystoscopy revealed some minimal ongoing gross hematuria. Left double-J stent was seen in good position. The stent was partially extracted and a guidewire placed through the stent to the left renal pelvis. The stent was then completely removed. I was unable to place a digital ureteroscope access sheath up to the left kidney without difficulty. Digital flexible ureteroscopy was then performed. A 12 mm stone was encountered freely floating in the left renal pelvis. Holmium laser lithotripsy was performed. This was utilized at 0.8 J 8 Hz. The stone was very dense and took well over an hour to perform successful lithotripsy. Eventually we had  25-30 pieces. The largest 12-15 stone fragments were basket extracted. There was moderate ongoing hematuria secondary the anticoagulation as well as the mild trauma from the procedure. I elected not to replace a double-J stent since 100 indwelling for 3 weeks and we felt that probably provided maximal ureteral dilation and a better chance to pass small clots and fragments and placing another stent. The procedure was quite difficult and we spent approximately 2 hours engaged in taking care of this stone. The patient was brought to recovery room in stable condition having had no obvious Occasions or problems.

## 2014-09-01 NOTE — Discharge Instructions (Signed)
[facility] °[phone] °Post Ureteroscopy With or Without Stent Instructions ° °Definitions: ° °Ureter: The duct that transports urine from the kidney to the bladder. °Stent:   A plastic hollow tube that is placed into the ureter, from the kidney to the                 bladder to prevent the ureter from swelling shut. ° °GENERAL INSTRUCTIONS: ° °Despite the fact that no skin incisions were used, the area around the ureter and bladder is raw and irritated. The stent is a foreign body which will further irritate the bladder wall. This irritation is manifested by increased frequency of urination, both day and night, and by an increase in the urge to urinate. In some, the urge to urinate is present almost always. Sometimes the urge is strong enough that you may not be able to stop yourself from urinating. The only real cure is to remove the stent and then give time for the bladder wall to heal which can't be done until the danger of the ureter swelling shut has passed, which varies. ° °You may see some blood in your urine while the stent is in place and a few days afterwards. Do not be alarmed, even if the urine was clear for a while. Get off your feet and drink lots of fluids until clearing occurs. If you start to pass clots or don't improve, call us. ° °DIET: °You may return to your normal diet immediately. Because of the raw surface of your bladder, alcohol, spicy foods, acid type foods and drinks with caffeine may cause irritation or frequency and should be used in moderation. To keep your urine flowing freely and to avoid constipation, drink plenty of fluids during the day ( 8-10 glasses ). °Tip: Avoid cranberry juice because it is very acidic. ° °ACTIVITY: °Your physical activity doesn't need to be restricted. However, if you are very active, you may see some blood in your urine. We suggest that you reduce your activity under these circumstances until the bleeding has stopped. ° °BOWELS: °It is  important to keep your bowels regular during the postoperative period. Straining with bowel movements can cause bleeding. A bowel movement every other day is reasonable. Use a mild laxative if needed, such as Milk of Magnesia 2-3 tablespoons, or 2 Dulcolax tablets. Call if you continue to have problems. If you have been taking narcotics for pain, before, during or after your surgery, you may be constipated. Take a laxative if necessary. ° ° °MEDICATION: °You should resume your pre-surgery medications unless told not to. In addition you will often be given an antibiotic to prevent infection. These should be taken as prescribed until the bottles are finished unless you are having an unusual reaction to one of the drugs. ° °PROBLEMS YOU SHOULD REPORT TO US: °· Fevers over 100.5 Fahrenheit. °· Heavy bleeding, or clots ( See above notes about blood in urine ). °· Inability to urinate. °· Drug reactions ( hives, rash, nausea, vomiting, diarrhea ). °· Severe burning or pain with urination that is not improving. ° °FOLLOW-UP: °You will need a follow-up appointment to monitor your progress. Call for this appointment at the number listed above. Usually the first appointment will be about three to fourteen days after your surgery. ° ° ° ° ° °

## 2014-09-01 NOTE — H&P (View-Only) (Signed)
History of Present Illness   [name] presents today as a referral from Dr Haroldine Laws and the heart failure LVAD team for further assessment of some gross hematuria and a recently diagnosed large left UPJ renal calculus. [name] is currently 73 years of age. He has an extremely complicated past medical history. The patient had LVAD placement and has done very well with that. He is on chronic anticoagulation. I have reviewed his surgeries and his past medical history as well as his current medications. He has no prior urologic history. He has not had any definitive known episodes of nephrolithiasis. The patient recently developed some discolored urine with gross hematuria. He has not had any significant flank or abdominal discomfort. CT scan revealed a 1.1 x 1.3 cm stone at his left ureteropelvic junction. There is mild hydronephrosis. Creatinine was stable at 1.42. It appears that his creatinine typically ranges from approximately 1.4 to slightly over 2. Urine culture remains pending at this time.   I was able to contact Dr Haroldine Laws this morning to discuss the situation at length. We talked about some of the logistical issues with regard to anesthesia and monitoring and what typically takes place. We talked about options for surgical intervention that would or would not require discontinuation of anticoagulation as well as the option of a simple temporizing maneuver with double J stent alone. Dr Haroldine Laws felt that his quality of life had improved dramatically on LVAD and was doing quite well. He felt that we should press ahead with an attempted definitive management of the stone rather than any kind of temporizing situation with double J stent placement. He is willing to assemble a team to come to [facility] where we have the technology and equipment necessary to deal with the stone in the most efficacious manner.      Past Medical History Problems  1. History of Bradycardia (427.89) 2. History of CAD  (coronary artery disease) (414.00) 3. History of Chronic clinical systolic heart failure (932.67) 4. History of Claudication (443.9) 5. History of anemia (V12.3) 6. History of arthritis (V13.4) 7. History of cardiovascular disorder (V12.50) 8. History of congestive heart failure (V12.59) 9. History of cytopenia (V12.3) 10. History of hyperlipidemia (V12.29) 11. History of hypertension (V12.59) 12. History of hypothyroidism (V12.29) 13. History of myocardial infarction (412) 14. History of peripheral vascular disease (V12.59) 15. History of renal insufficiency syndrome (V13.09) 16. History of sleep apnea (V13.89) 17. History of stroke (V12.54) 18. History of transient cerebral ischemia (V12.54) 19. History of Ischemic cardiomyopathy (414.8) 20. 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 History of Ventricular tachycardia (paroxysmal) (427.1)  Surgical History Problems  1. History of Heart Surgery 2. History of Pacemaker Placement  Current Meds 1. Adult Aspirin Low Strength 81 MG TBDP;  Therapy: (Recorded:25Sep2015) to Recorded 2. Amiodarone HCl - 200 MG Oral Tablet;  Therapy: (Recorded:25Sep2015) to Recorded 3. Cal-Mag-Zinc TABS;  Therapy: (Recorded:25Sep2015) to Recorded 4. Colace CAPS;  Therapy: (Recorded:25Sep2015) to Recorded 5. Coumadin 2 MG Oral Tablet (Warfarin Sodium);  Therapy: (Recorded:25Sep2015) to Recorded 6. Digoxin 125 MCG Oral Tablet;  Therapy: (Recorded:25Sep2015) to Recorded 7. Fergon TABS;  Therapy: (Recorded:25Sep2015) to Recorded 8. Lipitor 80 MG Oral Tablet (Atorvastatin Calcium);  Therapy: (Recorded:25Sep2015) to Recorded 9. Multi-Day TABS;  Therapy: (Recorded:25Sep2015) to Recorded 10. Nitrostat SUBL;   Therapy: (Recorded:25Sep2015) to Recorded 11. Norvasc 10 MG Oral Tablet (AmLODIPine Besylate);   Therapy: (Recorded:25Sep2015) to Recorded 12. Protonix 40 MG Intravenous Solution Reconstituted (Pantoprazole Sodium);   Therapy: (Recorded:25Sep2015)  to Recorded 13. Ranexa  500 MG Oral Tablet Extended Release 12 Hour;   Therapy: (Recorded:25Sep2015) to Recorded 14. Synthroid 125 MCG Oral Tablet (Levothyroxine Sodium);   Therapy: (Recorded:25Sep2015) to Recorded  Allergies Medication  1. Demerol TABS  Family History Problems  1. Family history of heart failure (V17.49) : Mother, Father  Social History Problems    Alcohol use (V49.89)   socially   Caffeine use (V49.89)   2 qd   Father's age   10yrs, heart faliure   Former smoker (V15.82)   Mother deceased   57yrs, heart faliure   Retired   Single   Two children  Review of Systems Genitourinary, constitutional, skin, eye, otolaryngeal, hematologic/lymphatic, cardiovascular, pulmonary, endocrine, musculoskeletal, gastrointestinal, neurological and psychiatric system(s) were reviewed and pertinent findings if present are noted.  Genitourinary: nocturia.  Gastrointestinal: vomiting.  Hematologic/Lymphatic: a tendency to easily bruise.  Cardiovascular: leg swelling.  Respiratory: cough.  Musculoskeletal: joint pain.    Vitals Vital Signs [Data Includes: Last 1 Day]  Recorded: 08QPY1950 09:06AM  Height: 6 ft 1 in Weight: 199 lb  BMI Calculated: 26.25 BSA Calculated: 2.15 Temperature: 97.6 F  Physical Exam Constitutional: Well nourished and well developed . No acute distress.  ENT:. The ears and nose are normal in appearance.  Neck: The appearance of the neck is normal and no neck mass is present.  Pulmonary: No respiratory distress and normal respiratory rhythm and effort.  Abdomen: The abdomen is soft and nontender. No masses are palpated. No CVA tenderness. No hernias are palpable. No hepatosplenomegaly noted.  Genitourinary: Examination of the penis demonstrates no discharge, no masses, no lesions and a normal meatus. The scrotum is without lesions. The right epididymis is palpably normal and non-tender. The left epididymis is palpably normal and  non-tender. The right testis is non-tender and without masses. The left testis is non-tender and without masses.  Skin: Normal skin turgor, no visible rash and no visible skin lesions.  Neuro/Psych:. Mood and affect are appropriate.    Results/Data  30 Jul 2014 8:58 AM  UA With REFLEX    COLOR RED     APPEARANCE CLOUDY     SPECIFIC GRAVITY 1.020     pH 6.5     GLUCOSE 100     BILIRUBIN NEG     KETONE TRACE     BLOOD LARGE     PROTEIN > 300     UROBILINOGEN 1     NITRITE POS     LEUKOCYTE ESTERASE TRACE     SQUAMOUS EPITHELIAL/HPF NONE SEEN     WBC NONE SEEN     CRYSTALS NONE SEEN     CASTS NONE SEEN     RBC TNTC     BACTERIA NONE SEEN     Assessment Assessed  1. Nephrolithiasis (592.0) 2. Hydronephrosis (591) 3. Gross hematuria (599.71)  Plan Gross hematuria  1. Follow-up Schedule Surgery Office  Follow-up  Status: Complete  Done: [id number]  Discussion/Summary Mr. Loiseau has an extremely complicated past and ongoing medical history. I reviewed records from [facility] as well his his imaging studies.  Fortunately, there is no emergent need to intervene for his stone. His pain is currently well managed, his hydronephrosis is relatively mild, his overall systemic renal function is at baseline and he has no evidence currently of urinary tract infection. This stone however does require definitive management. Lithotripsy would be difficult and would require discontinuation of all anticoagulation. Given the size and density of the stone there would also be a reasonably high  likelihood that secondary procedures would be necessary.  This stone is probably best managed with ureteroscopy with holmium laser lithotripsy. His can be done with continuation of anticoagulation but obviously given the location of the size of the stone can be time consuming and at times difficult. The situation was discussed at length with Dr. Haroldine Laws. There entire heart failure and LVAD team is located at [facility]. All of our equipment and expertise needed to perform the surgery safely and effectively is at [place] [facility]. He will be willing to transfer his team to our hospital here as soon as we can make the proper arrangements. Procedure and its risks and benefits were discussed at length with the patient and his wife today.  50 minutes spent face-to-face and coordinating care.

## 2014-09-01 NOTE — Interval H&P Note (Signed)
History and Physical Interval Note:  09/01/2014 8:35 AM  [name]  has presented today for surgery, with the diagnosis of LEFT URETERAL CALCULUS  The various methods of treatment have been discussed with the patient and family. After consideration of risks, benefits and other options for treatment, the patient has consented to  Procedure(s): CYSTOSCOPY WITH URETEROSCOPY AND STENT REMOVAL (Left) HOLMIUM LASER APPLICATION (Left) as a surgical intervention .  The patient's history has been reviewed, patient examined, no change in status, stable for surgery.  I have reviewed the patient's chart and labs.  Questions were answered to the patient's satisfaction.     Arron Tetrault S

## 2014-09-01 NOTE — Anesthesia Postprocedure Evaluation (Signed)
  Anesthesia Post-op Note  Patient: [name]  Procedure(s) Performed: Procedure(s): CYSTOSCOPY WITH URETEROSCOPY AND STENT REMOVAL (Left) HOLMIUM LASER APPLICATION (Left)  Patient Location: PACU  Anesthesia Type:General  Level of Consciousness: awake, alert  and oriented  Airway and Oxygen Therapy: Patient Spontanous Breathing  Post-op Pain: none  Post-op Assessment: Post-op Vital signs reviewed, Patient's Cardiovascular Status Stable, Respiratory Function Stable, Patent Airway, No signs of Nausea or vomiting and Pain level controlled  Post-op Vital Signs: Reviewed and stable  Last Vitals:  Filed Vitals:   09/01/14 1300  BP: 95/84  Pulse: 74  Temp:   Resp: 16    Complications: No apparent anesthesia complications

## 2014-09-01 NOTE — Anesthesia Preprocedure Evaluation (Signed)
Anesthesia Evaluation  Patient identified by MRN, date of birth, ID band Patient awake    Reviewed: Allergy & Precautions, H&P , NPO status , Patient's Chart, lab work & pertinent test results  History of Anesthesia Complications Negative for: history of anesthetic complications  Airway Mallampati: II  TM Distance: >3 FB Neck ROM: Full    Dental  (+) Edentulous Upper, Edentulous Lower   Pulmonary sleep apnea , former smoker,   Crackles with faint wheeze left lung        Cardiovascular hypertension, +CHF + dysrhythmias + Cardiac Defibrillator Rhythm:Regular  H/o ischemic cardiomyopathy s/p lvad (heartmate2) with AICD, h/o vtach, increased leg swelling last days and no PO lasix recently, weight increased   Neuro/Psych TIACVA, No Residual Symptoms    GI/Hepatic Neg liver ROS, GERD-  Controlled and Medicated,  Endo/Other    Renal/GU Renal InsufficiencyRenal diseaseKidney stone     Musculoskeletal  (+) Arthritis -,   Abdominal   Peds  Hematology  (+) anemia , Chronic anticoagulation, Coumadin held 10/27   Anesthesia Other Findings   Reproductive/Obstetrics                             Anesthesia Physical Anesthesia Plan  ASA: III  Anesthesia Plan: General   Post-op Pain Management:    Induction: Intravenous  Airway Management Planned: Oral ETT  Additional Equipment: Arterial line  Intra-op Plan:   Post-operative Plan: Extubation in OR  Informed Consent: I have reviewed the patients History and Physical, chart, labs and discussed the procedure including the risks, benefits and alternatives for the proposed anesthesia with the patient or authorized representative who has indicated his/her understanding and acceptance.     Plan Discussed with: CRNA and Surgeon  Anesthesia Plan Comments:         Anesthesia Quick Evaluation

## 2014-09-02 ENCOUNTER — Encounter (HOSPITAL_COMMUNITY): Payer: Medicare Other

## 2014-09-02 ENCOUNTER — Encounter (HOSPITAL_COMMUNITY): Payer: Self-pay | Admitting: Urology

## 2014-09-06 ENCOUNTER — Other Ambulatory Visit (HOSPITAL_COMMUNITY): Payer: Self-pay | Admitting: Internal Medicine

## 2014-09-07 LAB — PROTIME-INR
INR: 2 — ABNORMAL HIGH (ref 0.8–1.2)
PROTHROMBIN TIME: 21.5 s — AB (ref 9.1–12.0)

## 2014-09-09 ENCOUNTER — Ambulatory Visit (HOSPITAL_COMMUNITY): Payer: Self-pay | Admitting: *Deleted

## 2014-09-09 ENCOUNTER — Other Ambulatory Visit (HOSPITAL_COMMUNITY): Payer: Self-pay | Admitting: *Deleted

## 2014-09-09 ENCOUNTER — Ambulatory Visit (HOSPITAL_COMMUNITY)
Admission: RE | Admit: 2014-09-09 | Discharge: 2014-09-09 | Disposition: A | Discharge status: Home / Self Care | Payer: Medicare Other | Source: Ambulatory Visit | Attending: Cardiology | Admitting: Cardiology

## 2014-09-09 ENCOUNTER — Ambulatory Visit (HOSPITAL_COMMUNITY)
Admission: RE | Admit: 2014-09-09 | Discharge: 2014-09-09 | Disposition: A | Discharge status: Home / Self Care | Payer: Medicare Other | Source: Ambulatory Visit | Attending: Internal Medicine | Admitting: Internal Medicine

## 2014-09-09 VITALS — BP 80/0 | HR 75 | Ht 72.0 in | Wt 205.2 lb

## 2014-09-09 DIAGNOSIS — Z95811 Presence of heart assist device: Secondary | ICD-10-CM

## 2014-09-09 DIAGNOSIS — R310 Gross hematuria: Secondary | ICD-10-CM | POA: Insufficient documentation

## 2014-09-09 DIAGNOSIS — N200 Calculus of kidney: Secondary | ICD-10-CM

## 2014-09-09 DIAGNOSIS — I10 Essential (primary) hypertension: Secondary | ICD-10-CM

## 2014-09-09 DIAGNOSIS — I5022 Chronic systolic (congestive) heart failure: Secondary | ICD-10-CM

## 2014-09-09 DIAGNOSIS — I255 Ischemic cardiomyopathy: Secondary | ICD-10-CM

## 2014-09-09 DIAGNOSIS — Z7901 Long term (current) use of anticoagulants: Secondary | ICD-10-CM

## 2014-09-09 DIAGNOSIS — N183 Chronic kidney disease, stage 3 unspecified: Secondary | ICD-10-CM

## 2014-09-09 LAB — URINALYSIS, ROUTINE W REFLEX MICROSCOPIC
Bilirubin Urine: NEGATIVE
Glucose, UA: NEGATIVE mg/dL
Ketones, ur: NEGATIVE mg/dL
Nitrite: NEGATIVE
Protein, ur: 100 mg/dL — AB
Specific Gravity, Urine: 1.018 (ref 1.005–1.030)
UROBILINOGEN UA: 0.2 mg/dL (ref 0.0–1.0)
pH: 5.5 (ref 5.0–8.0)

## 2014-09-09 LAB — CBC
HCT: 25.2 % — ABNORMAL LOW (ref 39.0–52.0)
Hemoglobin: 8.4 g/dL — ABNORMAL LOW (ref 13.0–17.0)
MCH: 31.2 pg (ref 26.0–34.0)
MCHC: 33.3 g/dL (ref 30.0–36.0)
MCV: 93.7 fL (ref 78.0–100.0)
Platelets: 241 10*3/uL (ref 150–400)
RBC: 2.69 MIL/uL — ABNORMAL LOW (ref 4.22–5.81)
RDW: 16.1 % — ABNORMAL HIGH (ref 11.5–15.5)
WBC: 4.8 10*3/uL (ref 4.0–10.5)

## 2014-09-09 LAB — URINE MICROSCOPIC-ADD ON

## 2014-09-09 LAB — PRO B NATRIURETIC PEPTIDE: Pro B Natriuretic peptide (BNP): 5067 pg/mL — ABNORMAL HIGH (ref 0–125)

## 2014-09-09 LAB — BASIC METABOLIC PANEL
ANION GAP: 12 (ref 5–15)
BUN: 28 mg/dL — ABNORMAL HIGH (ref 6–23)
CALCIUM: 8.6 mg/dL (ref 8.4–10.5)
CO2: 23 mEq/L (ref 19–32)
Chloride: 103 mEq/L (ref 96–112)
Creatinine, Ser: 2.46 mg/dL — ABNORMAL HIGH (ref 0.50–1.35)
GFR calc Af Amer: 28 mL/min — ABNORMAL LOW (ref >90)
GFR calc non Af Amer: 24 mL/min — ABNORMAL LOW (ref >90)
Glucose, Bld: 102 mg/dL — ABNORMAL HIGH (ref 70–99)
Potassium: 4.6 mEq/L (ref 3.7–5.3)
SODIUM: 138 meq/L (ref 137–147)

## 2014-09-09 LAB — LACTATE DEHYDROGENASE: LDH: 273 U/L — ABNORMAL HIGH (ref 94–250)

## 2014-09-09 LAB — PROTIME-INR
INR: 2.27 — ABNORMAL HIGH (ref 0.00–1.49)
Prothrombin Time: 25.2 seconds — ABNORMAL HIGH (ref 11.6–15.2)

## 2014-09-09 MED ORDER — CEPHALEXIN 250 MG PO CAPS: 250.0000 mg | ORAL_CAPSULE | Freq: Every day | ORAL | Status: DC

## 2014-09-09 NOTE — Progress Notes (Signed)
Patient ID: [name], male   DOB: 1941-03-09, 73 y.o.   MRN: [id number]  PCP: Dr. Karolee Stamps ([place])  [name] is a 73 yo male with h/o VT, PAD and severe systolic HF (EF 24-23%) due to mixed ischemic/nonischemic CM he is s/p HM II LVAD implant for destination therapy on October 19, 2013. On 09/01/14 underwent cystoscopy and flexible ureteroscopy with lithotripsy and basket extraction.   VAD implantation was complicated by VT, syncope, A fib/Aflutter and persistent low output felt to be due to RHF. RHC showed low cardiac output but no evidence of RV failure on Milrinone. He was felt to be volume depleted. He was discharged home on 11/18/13 on mirlinone and PRN lasix 20 mg for a weight of 200 pounds or greater. Milrinone has since been weaned off.   RHC (07/13/14): Well compensated hemodynamics with VAD support.  Follow up for Heart Failure/LVAD: Doing well. Received IV lasix after procedure for volume overload. Denies SOB, orthopnea or CP. + Minimal DOE with hills and long distances. Taking lasix PRN. Still working FT and walking very frequently. Taking medications as prescribed. Following a low salt diet and drinking less than 2L a day. Having some hematuria that comes and goes. Weight up about 4-5 lbs.    Denies LVAD alarms.  Denies driveline trauma, erythema or drainage.  Denies ICD shocks.    Reports taking Coumadin as prescribed and adherence to anticoagulation based dietary restrictions.  Denies bright red blood per rectum or melena, no dark urine or hematuria.     Past Medical History  Diagnosis Date  . Bradycardia   . Ventricular tachycardia     s/p AICD;   h/o ICD shock;  Amiodarone Rx. S/P ablation in 2012  . 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 HTN (hypertension)   . HLD (hyperlipidemia)   . Cytopenia   . Hypothyroidism   .  Renal insufficiency   . Claudication   . Chronic systolic heart failure   . Ischemic cardiomyopathy     echo 7/12:  EF 25-30%, mild AI, mod MR, mod LAE  . TIA (transient ischemic attack) 2001  . CVD (cerebrovascular disease)     left CEA 2008  . Stroke   . Anemia   . Inappropriate therapy from implantable cardioverter-defibrillator 04/02/2013    ATP for sinus tach  SVT wavelet identified SVT but was no  passive   . Myocardial infarction 1992  . Anginal pain   . Automatic implantable cardioverter-defibrillator in situ     [employer]onic Evera device serial number [id number]    . Arthritis     "a little in my knees" (05/25/2013)  . Melanoma of ear 2013    "near left ear" (05/25/2013)  . 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 Dysrhythmia     HX A FIB  . History of nonmelanoma skin cancer     L EAR  . Kidney stone   . History of transfusion   . Sleep apnea     denies wearing mask (05/25/2013)  . CAD (coronary artery disease)     s/p MI 1982;  s/p DES to CFX 2011;  s/p NSTEMI 4/12 in setting of VTach;  cath 7/12:  LM ok, LAD mid 30-40%, Dx's with 40%, mCFX stent patent,  prox to mid RCA occluded with R to R and L to R collats.  Medical Tx was continued  . CHF (congestive heart failure)     HAS VENTRICULAR PUMP    Current Outpatient Prescriptions  Medication Sig Dispense Refill  . amiodarone (PACERONE) 200 MG tablet Take 200 mg by mouth 2 (two) times daily.    [name] amLODipine (NORVASC) 10 MG tablet Take 10 mg by mouth every evening.    [name] aspirin 81 MG tablet Take 81 mg by mouth every morning.     [name] atorvastatin (LIPITOR) 80 MG tablet Take 80 mg by mouth every morning.    [name] CALCIUM-MAGNESIUM-ZINC PO Take 3 tablets by mouth every evening. 1000 mg calcium, 400 mg magnesium, 25 mg zinc    . docusate sodium (COLACE) 100 MG capsule Take 200 mg by mouth every morning.    . ferrous gluconate (FERGON) 324 MG tablet Take 1 tablet (324 mg total) by mouth 3 (three) times daily with meals. 90 tablet 6   . furosemide (LASIX) 20 MG tablet Take 20 mg by mouth as needed.    . hydrALAZINE (APRESOLINE) 25 MG tablet Take 0.5 tablets (12.5 mg total) by mouth 3 (three) times daily. 90 tablet 6  . levothyroxine (SYNTHROID) 125 MCG tablet Take 1 tablet (125 mcg total) by mouth daily. 30 tablet 11  . Multiple Vitamin (MULTIVITAMIN) tablet Take 1 tablet by mouth at bedtime.     . Multiple Vitamins-Minerals (ICAPS PO) Take 1 tablet by mouth 2 (two) times daily.    . pantoprazole (PROTONIX) 40 MG tablet TAKE 1 TABLET (40 MG TOTAL) BY MOUTH DAILY. 30 tablet 6  . ranolazine (RANEXA) 500 MG 12 hr tablet Take 500 mg by mouth 2 (two) times daily.    [name] warfarin (COUMADIN) 2 MG tablet Take 2-4 mg by mouth at bedtime. Take 2 tablets (4 mg) on Monday,  and Friday, take 1 tablet (2 mg) on Sunday, Tuesday,Wednesday, Thursday and Saturday    . HYDROcodone-acetaminophen (NORCO/VICODIN) 5-325 MG per tablet Take 1-2 tablets by mouth every 6 (six) hours as needed. 20 tablet 0  . nitroGLYCERIN (NITROSTAT) 0.4 MG SL tablet Place 0.4 mg under the tongue every 5 (five) minutes as needed for chest pain. For chest pain     No current facility-administered medications for this encounter.    Demerol; Meperidine hcl; and Opium  REVIEW OF SYSTEMS: All systems negative except as listed in HPI, PMH and Problem list.  LVAD INTERROGATION:   HeartMate II LVAD:   Speed: 9200 Flow 4.7 liters/min Power 5.3 PI 6.8 Alarms: none Events: PI events 5-10 a day and a few low voltage advisories Fixed speed: 9200 Low speed limit:  8600 Primary Controller: Replace back up battery in 21 months (Feb 2017) Back up controller: Replace back up battery in 21 months (Feb 2017)  I reviewed the LVAD parameters from today, and compared the results to the patient's prior recorded data.  Increased set speed to 9200 and back-up speed to 8600. The LVAD is functioning within specified parameters.  The patient performs LVAD self-test daily.  LVAD  interrogation was negative for any significant power changes, alarms or PI events/speed drops.  LVAD equipment check completed and is in good working order.  Back-up equipment present.   LVAD education done on emergency procedures and precautions and reviewed exit site care.    Filed Vitals:   09/09/14 1042  BP: 80/0  Pulse: 75  Height: 6' (1.829 m)  Weight: 205 lb  3.2 oz (93.078 kg)  SpO2: 100%    Physical Exam: GENERAL: Well appearing, male who presents to clinic today in no acute distress. Wife present HEENT: normal  NECK: Supple, JVP 8 .  2+ bilaterally, no bruits.  No lymphadenopathy or thyromegaly appreciated.   CARDIAC:  Mechanical heart sounds with LVAD hum present.  LUNGS:  Clear to auscultation bilaterally.  ABDOMEN:  Soft, round, nontender, positive bowel sounds x4.     LVAD exit site: well-healed and incorporated.  Dressing dry and intact.  No erythema or drainage.  Stabilization device present and accurately applied.  Driveline dressing is being changed weekly per sterile technique. EXTREMITIES:  Warm and dry, no cyanosis, clubbing, bilateral 2 + bilateral edema NEUROLOGIC:  Alert and oriented x 4.  Gait steady.  No aphasia.  No dysarthria.  Affect pleasant.      ASSESSMENT AND PLAN:   1. Chronic systolic HF: s/p LVAD 45/4098 - NYHA II symptoms and volume status elevated. Optivol interrogated: Fluid above threshold, patient activity 2-3 hrs a day and no afib. Have asked him to take 20 mg lasix today and to take once or twice a week. - He is not on a BB with history of RV failure. - MAP stable. Will continue amlodipine 10 mg daily and hydralazine 12.5 mg TID. - Reinforced the need and importance of daily weights, a low sodium diet, and fluid restriction (less than 2 L a day). Instructed to call the HF clinic if weight increases more than 3 lbs overnight or 5 lbs in a week.  2. Afib/Flutter - s/p DC-CV 06/04/14. He remains in [facility] from [place]. Will continue amiodarone 200 mg  BID.  2) HTN - Stable. Continue current medications  3) LVAD - LVAD parameters stable.  - Check BMET, LDH, INR, CBC and pro-BNP  4) CKD, stage III - Check BMET today.  5) Anticoagulation management: - Check INR today and will address accordingly 6) Hematuria - Will check UA and culture. 7) S/P Renal Stone lithotripsy - Check CT today   F/U 1 month [name] B  NP-C 10:51 AM

## 2014-09-09 NOTE — Telephone Encounter (Signed)
Called and spoke with [name] per Dr. Haroldine Laws. Instructed pt to start Keflex 250 mg daily. Dr. Cy Blamer office will be contacting patient to arrange f/u next week. [name] verbalized understanding of same.

## 2014-09-09 NOTE — Patient Instructions (Signed)
1.  No change in coumadin dose. Re-check INR 09/23/14 here. 2.  Take Lasix 1 - 2 times weekly based on lower extremity edema.  3.  Re-check CBC and BMP on Monday 09/13/14. 4.  Return to [facility] in one month.

## 2014-09-09 NOTE — Progress Notes (Signed)
Symptom  Yes  No  Details   Angina       x Activity:   Claudication        x How far:   Syncope        x When:     Stroke        x        TIA  2001  Orthopnea         x How many pillows: 1  PND         x How often:  CPAP       N/A How many hrs:   Pedal edema        x   x 1 - PRN lasix with relief  Abd fullness               x    N&V        x         1 x weekly  Diaphoresis               x   Bleeding       x         bloody urine  Urine color        bloody urine periodically  SOB        x      Activity:  Outside in hot, humid weather  Palpitations        x When:  ICD shock        x   Hospitlizaitons             x When/where/why:   ED visit        x When/where/why:  Other MD              x When/who/why:   Activity        Working 5 hrs/5 days week; walking 2 miles day  Fluid    No limitations  < 2000   Diet    Low salt food   Vital signs: HR: 75 MAP BP:  80 O2 Sat:  100 Wt:  205.2 bs Last wt: 199  lbs Ht: 6'1"  LVAD interrogation reveals:  Speed:   9200 RPM Flow:  5.2 Power:   5.5 PI:  5.7 Alarms:  One low voltage Events:  5 - 10 PI events Fixed speed:  9200 Low speed limit:  8600 Primary Controller:  Replace back up battery in 21 months  (Feb 2017) Back up controller:   Replace back up battery in 21 months (Feb 2017)   VAD dressing removed and site care performed using sterile technique. Drive line exit site cleaned with Chlora prep applicators x 2, allowed to dry, and Sorbaview dressing with Biopatch re-applied. Complete tissue ingrowth with velour fully implanted at exit site. No erythema, tenderness, drainage, or foul odor noted. Drive line anchor re-applied. Pt denies fever or chills. Driveline dressing is being changed weekly per sterile technique. Patient/caregiver deny any alarms or VAD equipment issues. VAD coordinator reviewed daily log from home for daily temperature, weight, and VAD parameters. Pt is performing daily controller and system monitor self tests  along with completing weekly and monthly maintenance for LVAD equipment.  LVAD equipment check completed and is in good working order. Back-up equipment present. Emergency procedures and precautions reviewed.  Optivol interrogation completed and reviewed.   Pt still voiding bloody urine intermittently; UA obtained for culture with planned CT abd/pelvis to r/o ureteral stone per Dr.  Bensimhon.

## 2014-09-10 LAB — URINE CULTURE
Colony Count: NO GROWTH
Culture: NO GROWTH

## 2014-09-13 ENCOUNTER — Inpatient Hospital Stay (HOSPITAL_COMMUNITY): Payer: Medicare Other

## 2014-09-13 ENCOUNTER — Inpatient Hospital Stay (HOSPITAL_COMMUNITY)
Admission: EM | Admit: 2014-09-13 | Discharge: 2014-09-23 | DRG: 988 | Disposition: A | Discharge status: Home / Self Care | Payer: Medicare Other | Attending: Internal Medicine | Admitting: Internal Medicine

## 2014-09-13 ENCOUNTER — Telehealth (HOSPITAL_COMMUNITY): Payer: Self-pay | Admitting: *Deleted

## 2014-09-13 ENCOUNTER — Encounter (HOSPITAL_COMMUNITY): Payer: Self-pay | Admitting: Emergency Medicine

## 2014-09-13 DIAGNOSIS — I255 Ischemic cardiomyopathy: Secondary | ICD-10-CM | POA: Diagnosis present

## 2014-09-13 DIAGNOSIS — Z95810 Presence of automatic (implantable) cardiac defibrillator: Secondary | ICD-10-CM

## 2014-09-13 DIAGNOSIS — D62 Acute posthemorrhagic anemia: Secondary | ICD-10-CM | POA: Diagnosis not present

## 2014-09-13 DIAGNOSIS — E039 Hypothyroidism, unspecified: Secondary | ICD-10-CM | POA: Diagnosis present

## 2014-09-13 DIAGNOSIS — J45909 Unspecified asthma, uncomplicated: Secondary | ICD-10-CM | POA: Diagnosis present

## 2014-09-13 DIAGNOSIS — I480 Paroxysmal atrial fibrillation: Secondary | ICD-10-CM | POA: Diagnosis present

## 2014-09-13 DIAGNOSIS — N183 Chronic kidney disease, stage 3 (moderate): Secondary | ICD-10-CM | POA: Diagnosis present

## 2014-09-13 DIAGNOSIS — I129 Hypertensive chronic kidney disease with stage 1 through stage 4 chronic kidney disease, or unspecified chronic kidney disease: Secondary | ICD-10-CM | POA: Diagnosis present

## 2014-09-13 DIAGNOSIS — I252 Old myocardial infarction: Secondary | ICD-10-CM | POA: Diagnosis not present

## 2014-09-13 DIAGNOSIS — E785 Hyperlipidemia, unspecified: Secondary | ICD-10-CM | POA: Diagnosis present

## 2014-09-13 DIAGNOSIS — N200 Calculus of kidney: Secondary | ICD-10-CM | POA: Diagnosis present

## 2014-09-13 DIAGNOSIS — I70213 Atherosclerosis of native arteries of extremities with intermittent claudication, bilateral legs: Secondary | ICD-10-CM | POA: Diagnosis present

## 2014-09-13 DIAGNOSIS — Z87891 Personal history of nicotine dependence: Secondary | ICD-10-CM

## 2014-09-13 DIAGNOSIS — Z7901 Long term (current) use of anticoagulants: Secondary | ICD-10-CM

## 2014-09-13 DIAGNOSIS — N028 Recurrent and persistent hematuria with other morphologic changes: Secondary | ICD-10-CM | POA: Diagnosis present

## 2014-09-13 DIAGNOSIS — I2510 Atherosclerotic heart disease of native coronary artery without angina pectoris: Secondary | ICD-10-CM | POA: Diagnosis present

## 2014-09-13 DIAGNOSIS — J449 Chronic obstructive pulmonary disease, unspecified: Secondary | ICD-10-CM | POA: Diagnosis present

## 2014-09-13 DIAGNOSIS — Z955 Presence of coronary angioplasty implant and graft: Secondary | ICD-10-CM | POA: Diagnosis not present

## 2014-09-13 DIAGNOSIS — Z95811 Presence of heart assist device: Secondary | ICD-10-CM

## 2014-09-13 DIAGNOSIS — N281 Cyst of kidney, acquired: Secondary | ICD-10-CM | POA: Diagnosis present

## 2014-09-13 DIAGNOSIS — I50810 Right heart failure, unspecified: Secondary | ICD-10-CM

## 2014-09-13 DIAGNOSIS — Z7982 Long term (current) use of aspirin: Secondary | ICD-10-CM | POA: Diagnosis not present

## 2014-09-13 DIAGNOSIS — I440 Atrioventricular block, first degree: Secondary | ICD-10-CM | POA: Diagnosis present

## 2014-09-13 DIAGNOSIS — I5023 Acute on chronic systolic (congestive) heart failure: Secondary | ICD-10-CM | POA: Diagnosis present

## 2014-09-13 DIAGNOSIS — R531 Weakness: Secondary | ICD-10-CM

## 2014-09-13 DIAGNOSIS — Z8673 Personal history of transient ischemic attack (TIA), and cerebral infarction without residual deficits: Secondary | ICD-10-CM

## 2014-09-13 DIAGNOSIS — D500 Iron deficiency anemia secondary to blood loss (chronic): Secondary | ICD-10-CM | POA: Diagnosis present

## 2014-09-13 DIAGNOSIS — Z9889 Other specified postprocedural states: Secondary | ICD-10-CM

## 2014-09-13 DIAGNOSIS — Z823 Family history of stroke: Secondary | ICD-10-CM

## 2014-09-13 DIAGNOSIS — I159 Secondary hypertension, unspecified: Secondary | ICD-10-CM

## 2014-09-13 DIAGNOSIS — N289 Disorder of kidney and ureter, unspecified: Secondary | ICD-10-CM

## 2014-09-13 DIAGNOSIS — Z8249 Family history of ischemic heart disease and other diseases of the circulatory system: Secondary | ICD-10-CM

## 2014-09-13 DIAGNOSIS — I509 Heart failure, unspecified: Secondary | ICD-10-CM

## 2014-09-13 DIAGNOSIS — I428 Other cardiomyopathies: Secondary | ICD-10-CM | POA: Diagnosis present

## 2014-09-13 DIAGNOSIS — R0600 Dyspnea, unspecified: Secondary | ICD-10-CM | POA: Diagnosis not present

## 2014-09-13 DIAGNOSIS — N132 Hydronephrosis with renal and ureteral calculous obstruction: Secondary | ICD-10-CM | POA: Diagnosis present

## 2014-09-13 DIAGNOSIS — Z419 Encounter for procedure for purposes other than remedying health state, unspecified: Secondary | ICD-10-CM

## 2014-09-13 DIAGNOSIS — N179 Acute kidney failure, unspecified: Secondary | ICD-10-CM | POA: Diagnosis not present

## 2014-09-13 DIAGNOSIS — I4892 Unspecified atrial flutter: Secondary | ICD-10-CM | POA: Diagnosis present

## 2014-09-13 DIAGNOSIS — R0602 Shortness of breath: Secondary | ICD-10-CM

## 2014-09-13 LAB — TROPONIN I

## 2014-09-13 LAB — CBC WITH DIFFERENTIAL/PLATELET
Basophils Absolute: 0 10*3/uL (ref 0.0–0.1)
Basophils Relative: 1 % (ref 0–1)
EOS PCT: 4 % (ref 0–5)
Eosinophils Absolute: 0.2 10*3/uL (ref 0.0–0.7)
HCT: 26.3 % — ABNORMAL LOW (ref 39.0–52.0)
Hemoglobin: 8.6 g/dL — ABNORMAL LOW (ref 13.0–17.0)
LYMPHS ABS: 0.7 10*3/uL (ref 0.7–4.0)
LYMPHS PCT: 13 % (ref 12–46)
MCH: 31.6 pg (ref 26.0–34.0)
MCHC: 32.7 g/dL (ref 30.0–36.0)
MCV: 96.7 fL (ref 78.0–100.0)
Monocytes Absolute: 0.7 10*3/uL (ref 0.1–1.0)
Monocytes Relative: 13 % — ABNORMAL HIGH (ref 3–12)
NEUTROS ABS: 3.7 10*3/uL (ref 1.7–7.7)
NEUTROS PCT: 69 % (ref 43–77)
PLATELETS: 317 10*3/uL (ref 150–400)
RBC: 2.72 MIL/uL — AB (ref 4.22–5.81)
RDW: 16.7 % — ABNORMAL HIGH (ref 11.5–15.5)
WBC: 5.3 10*3/uL (ref 4.0–10.5)

## 2014-09-13 LAB — URINALYSIS, ROUTINE W REFLEX MICROSCOPIC
Bilirubin Urine: NEGATIVE
Bilirubin Urine: NEGATIVE
GLUCOSE, UA: NEGATIVE mg/dL
Glucose, UA: NEGATIVE mg/dL
KETONES UR: NEGATIVE mg/dL
Ketones, ur: 15 mg/dL — AB
Leukocytes, UA: NEGATIVE
Nitrite: NEGATIVE
Nitrite: NEGATIVE
PROTEIN: 100 mg/dL — AB
Protein, ur: 100 mg/dL — AB
SPECIFIC GRAVITY, URINE: 1.011 (ref 1.005–1.030)
Specific Gravity, Urine: 1.011 (ref 1.005–1.030)
Urobilinogen, UA: 0.2 mg/dL (ref 0.0–1.0)
Urobilinogen, UA: 0.2 mg/dL (ref 0.0–1.0)
pH: 7.5 (ref 5.0–8.0)
pH: 7.5 (ref 5.0–8.0)

## 2014-09-13 LAB — COMPREHENSIVE METABOLIC PANEL
ALBUMIN: 3.6 g/dL (ref 3.5–5.2)
ALT: 15 U/L (ref 0–53)
ANION GAP: 14 (ref 5–15)
AST: 24 U/L (ref 0–37)
Alkaline Phosphatase: 106 U/L (ref 39–117)
BUN: 24 mg/dL — ABNORMAL HIGH (ref 6–23)
CHLORIDE: 100 meq/L (ref 96–112)
CO2: 25 meq/L (ref 19–32)
Calcium: 8.8 mg/dL (ref 8.4–10.5)
Creatinine, Ser: 2.01 mg/dL — ABNORMAL HIGH (ref 0.50–1.35)
GFR calc Af Amer: 36 mL/min — ABNORMAL LOW (ref >90)
GFR calc non Af Amer: 31 mL/min — ABNORMAL LOW (ref >90)
GLUCOSE: 103 mg/dL — AB (ref 70–99)
POTASSIUM: 4.1 meq/L (ref 3.7–5.3)
SODIUM: 139 meq/L (ref 137–147)
TOTAL PROTEIN: 6.7 g/dL (ref 6.0–8.3)
Total Bilirubin: 0.4 mg/dL (ref 0.3–1.2)

## 2014-09-13 LAB — LACTATE DEHYDROGENASE: LDH: 272 U/L — ABNORMAL HIGH (ref 94–250)

## 2014-09-13 LAB — URINE MICROSCOPIC-ADD ON

## 2014-09-13 LAB — PROTIME-INR
INR: 2.57 — ABNORMAL HIGH (ref 0.00–1.49)
Prothrombin Time: 27.8 seconds — ABNORMAL HIGH (ref 11.6–15.2)

## 2014-09-13 LAB — PRO B NATRIURETIC PEPTIDE: PRO B NATRI PEPTIDE: 5152 pg/mL — AB (ref 0–125)

## 2014-09-13 LAB — MRSA PCR SCREENING: MRSA by PCR: NEGATIVE

## 2014-09-13 LAB — LACTIC ACID, PLASMA: Lactic Acid, Venous: 0.9 mmol/L (ref 0.5–2.2)

## 2014-09-13 MED ORDER — LEVOTHYROXINE SODIUM 125 MCG PO TABS
125.0000 ug | ORAL_TABLET | Freq: Every day | ORAL | Status: DC
  Administered 2014-09-14 – 2014-09-23 (×10): 125 ug via ORAL
  Filled 2014-09-13 (×11): qty 1

## 2014-09-13 MED ORDER — HYDRALAZINE HCL 25 MG PO TABS
12.5000 mg | ORAL_TABLET | Freq: Three times a day (TID) | ORAL | Status: DC
  Administered 2014-09-13 (×2): 12.5 mg via ORAL
  Filled 2014-09-13 (×5): qty 0.5

## 2014-09-13 MED ORDER — WARFARIN - PHARMACIST DOSING INPATIENT
Freq: Every day | Status: DC
  Administered 2014-09-18 – 2014-09-22 (×2)

## 2014-09-13 MED ORDER — DOCUSATE SODIUM 100 MG PO CAPS
200.0000 mg | ORAL_CAPSULE | Freq: Every day | ORAL | Status: DC
  Administered 2014-09-14 – 2014-09-23 (×10): 200 mg via ORAL
  Filled 2014-09-13 (×11): qty 2

## 2014-09-13 MED ORDER — ACETAMINOPHEN 650 MG RE SUPP: 650.0000 mg | RECTAL | Status: DC | PRN

## 2014-09-13 MED ORDER — WARFARIN SODIUM 4 MG PO TABS
4.0000 mg | ORAL_TABLET | ORAL | Status: DC
  Administered 2014-09-13: 4 mg via ORAL
  Filled 2014-09-13: qty 1

## 2014-09-13 MED ORDER — FERROUS GLUCONATE 324 (38 FE) MG PO TABS
324.0000 mg | ORAL_TABLET | Freq: Three times a day (TID) | ORAL | Status: DC
  Administered 2014-09-13 – 2014-09-23 (×28): 324 mg via ORAL
  Filled 2014-09-13 (×33): qty 1

## 2014-09-13 MED ORDER — AMLODIPINE BESYLATE 10 MG PO TABS
10.0000 mg | ORAL_TABLET | Freq: Every day | ORAL | Status: DC
  Filled 2014-09-13: qty 1

## 2014-09-13 MED ORDER — ASPIRIN 81 MG PO CHEW
81.0000 mg | CHEWABLE_TABLET | Freq: Every day | ORAL | Status: DC
  Administered 2014-09-14: 81 mg via ORAL
  Filled 2014-09-13: qty 1

## 2014-09-13 MED ORDER — POTASSIUM CHLORIDE CRYS ER 20 MEQ PO TBCR
40.0000 meq | EXTENDED_RELEASE_TABLET | Freq: Once | ORAL | Status: AC
  Administered 2014-09-13: 40 meq via ORAL
  Filled 2014-09-13: qty 2

## 2014-09-13 MED ORDER — ATORVASTATIN CALCIUM 80 MG PO TABS
80.0000 mg | ORAL_TABLET | Freq: Every day | ORAL | Status: DC
  Administered 2014-09-13 – 2014-09-22 (×10): 80 mg via ORAL
  Filled 2014-09-13 (×11): qty 1

## 2014-09-13 MED ORDER — FUROSEMIDE 10 MG/ML IJ SOLN
40.0000 mg | Freq: Once | INTRAMUSCULAR | Status: AC
  Administered 2014-09-13: 40 mg via INTRAVENOUS
  Filled 2014-09-13: qty 4

## 2014-09-13 MED ORDER — AMIODARONE HCL 200 MG PO TABS
200.0000 mg | ORAL_TABLET | Freq: Two times a day (BID) | ORAL | Status: DC
  Administered 2014-09-13 – 2014-09-23 (×20): 200 mg via ORAL
  Filled 2014-09-13 (×23): qty 1

## 2014-09-13 MED ORDER — ACETAMINOPHEN 325 MG PO TABS: 650.0000 mg | ORAL_TABLET | ORAL | Status: DC | PRN

## 2014-09-13 MED ORDER — FUROSEMIDE 10 MG/ML IJ SOLN: 80.0000 mg | Freq: Once | INTRAMUSCULAR | Status: DC

## 2014-09-13 MED ORDER — WARFARIN SODIUM 2 MG PO TABS
2.0000 mg | ORAL_TABLET | ORAL | Status: DC
  Filled 2014-09-13: qty 1

## 2014-09-13 MED ORDER — RANOLAZINE ER 500 MG PO TB12
500.0000 mg | ORAL_TABLET | Freq: Two times a day (BID) | ORAL | Status: DC
  Administered 2014-09-13 – 2014-09-23 (×20): 500 mg via ORAL
  Filled 2014-09-13 (×22): qty 1

## 2014-09-13 MED ORDER — AMLODIPINE BESYLATE 10 MG PO TABS
10.0000 mg | ORAL_TABLET | Freq: Every day | ORAL | Status: DC
  Administered 2014-09-13 – 2014-09-22 (×10): 10 mg via ORAL
  Filled 2014-09-13 (×12): qty 1

## 2014-09-13 MED ORDER — PANTOPRAZOLE SODIUM 40 MG PO TBEC
40.0000 mg | DELAYED_RELEASE_TABLET | Freq: Every day | ORAL | Status: DC
  Administered 2014-09-14 – 2014-09-23 (×10): 40 mg via ORAL
  Filled 2014-09-13 (×10): qty 1

## 2014-09-13 NOTE — Telephone Encounter (Signed)
[name] called to report patient unable to walk; states he is SOB and "legs give out" after walking 50 feet.  Dr. Haroldine Laws updated - instructed pt to come to ED for evaluation. ED charge notified, will call VAD coordinator when patient arrives and I will bring VAD cart and meet patient. Pt will arrive by private vehicle at @ 10 am.

## 2014-09-13 NOTE — Consult Note (Signed)
Primary Physician: Dr. Karolee Stamps ([place]) Primary Cardiologist:  Dr. Haroldine Laws  Reason for Admission: Shortness of breath  HPI:    [name] is a 73 yo male with h/o VT, PAD and severe systolic HF (EF 24-82%) due to mixed ischemic/nonischemic CM he is s/p HM II LVAD implant for destination therapy on October 19, 2013. On 09/01/14 underwent cystoscopy and flexible ureteroscopy with lithotripsy and basket extraction.   VAD implantation was complicated by VT, syncope, A fib/Aflutter and persistent low output felt to be due to RHF. RHC showed low cardiac output but no evidence of RV failure on Milrinone. He was felt to be volume depleted. He was discharged home on 11/18/13 on mirlinone and PRN lasix 20 mg for a weight of 200 pounds or greater. Milrinone has since been weaned off  On 09/01/14 underwent cystoscopy and flexible ureteroscopy with lithotripsy and basket extraction. Seen in the clinic last week and was doing well. He was still having some hematuria and Hgb was trending down. Volume status was mildly elevated and told to take lasix. Renal function was up and underwent CT of abdomen and pelvis: interval fragmentation of L renal pelvis calculus, one fragment is lodged in mid ureter with mild hydronephrosis prox to calculus.  Presented today to ED with increased SOB and LE edema. Took lasix Friday. Still having hematuria. Denies CP, fevers or chills.    LVAD INTERROGATION:  HeartMate II LVAD:  Flow 5.1 liters/min, speed 9200, power 5.6, PI 7.6; 6 PI events     Review of Systems: [y] = yes, [ ]  = no   General: Weight gain [Y ]; Weight loss [ ] ; Anorexia [ ] ; Fatigue [ ] ; Fever [N ]; Chills Aqua.Slicker ]; Weakness [ ]   Cardiac: Chest pain/pressure [ ] ; Resting SOB [ ] ; Exertional SOB [ Y]; Orthopnea [ ] ; Pedal Edema [ Y]; Palpitations Aqua.Slicker ]; Syncope [ ] ; Presyncope [ ] ; Paroxysmal nocturnal dyspnea[ ]   Pulmonary: Cough [ ] ; Wheezing[ ] ; Hemoptysis[ ] ; Sputum [ ] ; Snoring [ ]   GI:  Vomiting[ ] ; Dysphagia[ ] ; Melena[ ] ; Hematochezia [ ] ; Heartburn[ ] ; Abdominal pain [ ] ; Constipation [ ] ; Diarrhea Aqua.Slicker ]; BRBPR [ ]   GU: Hematuria[ ] ; Dysuria [ ] ; Nocturia[ ]   Vascular: Pain in legs with walking [Y ]; Pain in feet with lying flat [ ] ; Non-healing sores [ ] ; Stroke [ ] ; TIA [ ] ; Slurred speech [ ] ;  Neuro: Headaches[ ] ; Vertigo[ ] ; Seizures[ ] ; Paresthesias[ ] ;Blurred vision [ ] ; Diplopia [ ] ; Vision changes [ ]   Ortho/Skin: Arthritis [ ] ; Joint pain [Y ]; Muscle pain [ ] ; Joint swelling [ ] ; Back Pain [ ] ; Rash [ ]   Psych: Depression[ ] ; Anxiety[ ]   Heme: Bleeding problems [ ] ; Clotting disorders [ ] ; Anemia [ ]   Endocrine: Diabetes [ ] ; Thyroid dysfunction[ ]   Home Medications Prior to Admission medications   Medication Sig Start Date End Date Taking? Authorizing Provider  amiodarone (PACERONE) 200 MG tablet Take 200 mg by mouth 2 (two) times daily. 06/03/14   Jolaine Artist, MD  amLODipine (NORVASC) 10 MG tablet Take 10 mg by mouth every evening.    Historical Provider, MD  aspirin 81 MG tablet Take 81 mg by mouth every morning.     Historical Provider, MD  atorvastatin (LIPITOR) 80 MG tablet Take 80 mg by mouth every morning.    Historical Provider, MD  CALCIUM-MAGNESIUM-ZINC PO Take 3 tablets by mouth every evening. 1000 mg calcium, 400 mg magnesium, 25  mg zinc    Historical Provider, MD  cephALEXin (KEFLEX) 250 MG capsule Take 1 capsule (250 mg total) by mouth daily. 09/09/14   Jolaine Artist, MD  docusate sodium (COLACE) 100 MG capsule Take 200 mg by mouth every morning.    Historical Provider, MD  ferrous gluconate (FERGON) 324 MG tablet Take 1 tablet (324 mg total) by mouth 3 (three) times daily with meals. 06/07/14   Jolaine Artist, MD  furosemide (LASIX) 20 MG tablet Take 20 mg by mouth 2 (two) times a week.     Historical Provider, MD  hydrALAZINE (APRESOLINE) 25 MG tablet Take 0.5 tablets (12.5 mg total) by mouth 3 (three) times daily. 07/29/14   Rande Brunt, NP  HYDROcodone-acetaminophen (NORCO/VICODIN) 5-325 MG per tablet Take 1-2 tablets by mouth every 6 (six) hours as needed. 08/09/14   Bernestine Amass, MD  levothyroxine (SYNTHROID) 125 MCG tablet Take 1 tablet (125 mcg total) by mouth daily. 04/01/14   Deboraha Sprang, MD  Multiple Vitamin (MULTIVITAMIN) tablet Take 1 tablet by mouth at bedtime.     Historical Provider, MD  Multiple Vitamins-Minerals (ICAPS PO) Take 1 tablet by mouth 2 (two) times daily.    Historical Provider, MD  nitroGLYCERIN (NITROSTAT) 0.4 MG SL tablet Place 0.4 mg under the tongue every 5 (five) minutes as needed for chest pain. For chest pain    Historical Provider, MD  pantoprazole (PROTONIX) 40 MG tablet TAKE 1 TABLET (40 MG TOTAL) BY MOUTH DAILY. 09/01/14   Jolaine Artist, MD  ranolazine (RANEXA) 500 MG 12 hr tablet Take 500 mg by mouth 2 (two) times daily.    Historical Provider, MD  warfarin (COUMADIN) 2 MG tablet Take 2-4 mg by mouth at bedtime. Take 2 tablets (4 mg) on Monday,  and Friday, take 1 tablet (2 mg) on Sunday, Tuesday,Wednesday, Thursday and Saturday    Historical Provider, MD    Past Medical History: Past Medical History  Diagnosis Date  . Bradycardia   . Ventricular tachycardia     s/p AICD;   h/o ICD shock;  Amiodarone Rx. S/P ablation in 2012  . 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 HTN (hypertension)   . HLD (hyperlipidemia)   . Cytopenia   . Hypothyroidism   . Renal insufficiency   . Claudication   . Chronic systolic heart failure   . Ischemic cardiomyopathy     echo 7/12:  EF 25-30%, mild AI, mod MR, mod LAE  . TIA (transient ischemic attack) 2001  . CVD (cerebrovascular disease)     left CEA 2008  . Stroke   . Anemia   . Inappropriate therapy from implantable cardioverter-defibrillator 04/02/2013    ATP for sinus tach  SVT wavelet identified  SVT but was no  passive   . Myocardial infarction 1992  . Anginal pain   . Automatic implantable cardioverter-defibrillator in situ     Medtronic Evera device serial number [id number]    . Arthritis     "a little in my knees" (05/25/2013)  . Melanoma of ear 2013    "near left ear" (05/25/2013)  . 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 Dysrhythmia     HX A FIB  . History of nonmelanoma skin cancer  L EAR  . Kidney stone   . History of transfusion   . Sleep apnea     denies wearing mask (05/25/2013)  . CAD (coronary artery disease)     s/p MI 1982;  s/p DES to CFX 2011;  s/p NSTEMI 4/12 in setting of VTach;  cath 7/12:  LM ok, LAD mid 30-40%, Dx's with 40%, mCFX stent patent, prox to mid RCA occluded with R to R and L to R collats.  Medical Tx was continued  . CHF (congestive heart failure)     HAS VENTRICULAR PUMP    Past Surgical History: Past Surgical History  Procedure Laterality Date  . Femoral-popliteal bypass graft Right 2011  . Cardiac defibrillator placement  2003; 2005; 05/25/2013    2014: Medtronic Evera device serial number [id number]  . Revascularization / in-situ graft leg    . Renal artery bypass Bilateral 2003  . Back surgery    . Lumbar disc surgery  1974; 2000's  . Knee arthroscopy Bilateral 1990's  . Elbow arthroscopy Right 1990's  . Cholecystectomy  12/15/?2010  . Lipoma excision Left 2013    "near ear" (05/25/2013)  . Heel spur surgery Right 1990's  . Cataract extraction w/ intraocular lens  implant, bilateral Bilateral 2000  . Carotid endarterectomy Left ~ 2007  . Doppler echocardiography  2011  . Tonsillectomy  1946  . Femoral-popliteal bypass graft Left 05/2002    Archie Endo 05/06/2002 (05/25/2013)  . Tumor excision Left 1960's    "fatty tumor" (05/25/2013)  . Cardiac electrophysiology study and ablation  2012  . Coronary angioplasty  1992  . Cardiac catheterization      "several" (05/25/2013)  . Coronary angioplasty with stent placement      "last one  was 11/2012  (05/25/2013)  . Cardioversion N/A 10/15/2013    Procedure: CARDIOVERSION;  Surgeon: Jolaine Artist, MD;  Location: [place];  Service: Cardiovascular;  Laterality: N/A;  . Insertion of implantable left ventricular assist device N/A 10/19/2013    Procedure: INSERTION OF IMPLANTABLE LEFT VENTRICULAR ASSIST DEVICE;  Surgeon: Gaye Pollack, MD;  Location: [place];  Service: Open Heart Surgery;  Laterality: N/A;  CIRC ARREST  NITRIC OXIDE  MEDTRONIC ICD  . Intraoperative transesophageal echocardiogram N/A 10/19/2013    Procedure: INTRAOPERATIVE TRANSESOPHAGEAL ECHOCARDIOGRAM;  Surgeon: Gaye Pollack, MD;  [facility];  Service: Open Heart Surgery;  Laterality: N/A;  . Tee without cardioversion N/A 11/04/2013    Procedure: TRANSESOPHAGEAL ECHOCARDIOGRAM (TEE);  Surgeon: Larey Dresser, MD;  Location: [place];  Service: Cardiovascular;  Laterality: N/A;  . Cardioversion N/A 11/04/2013    Procedure: CARDIOVERSION;  Surgeon: Larey Dresser, MD;  Location: [place];  Service: Cardiovascular;  Laterality: N/A;  . Cardioversion N/A 06/04/2014    Procedure: CARDIOVERSION;  Surgeon: Jolaine Artist, MD;  Location[facility] ENDOSCOPY;  Service: Cardiovascular;  Laterality: N/A;  . Cystoscopy with retrograde pyelogram, ureteroscopy and stent placement Left 08/09/2014    Procedure: [place], URETEROSCOPY AND STENT PLACEMENT;  Surgeon: Bernestine Amass, MD;  Location: [facility] ORS;  Service: Urology;  Laterality: Left;  . Cystoscopy with retrograde pyelogram, ureteroscopy and stent placement Left 09/01/2014    Procedure: CYSTOSCOPY WITH URETEROSCOPY AND STENT REMOVAL;  Surgeon: Bernestine Amass, MD;  Location: [facility];  Service: Urology;  Laterality: Left;  . Holmium laser application Left 30/16/0109    Procedure: HOLMIUM LASER APPLICATION;  Surgeon: Bernestine Amass, MD;  Location: [facility] ORS;  Service: Urology;  Laterality:  Left;    Family History: Family History  Problem  Relation Age of Onset  . Coronary artery disease    . Heart attack Mother   . Coronary artery disease Mother   . Cancer Mother   . Heart disease Mother   . Hyperlipidemia Mother   . Hypertension Mother   . Coronary artery disease Father   . Stroke Father   . Cancer Father   . Heart disease Father     before age 68  . Hyperlipidemia Father   . Hypertension Father   . Heart attack Father     Social History: History   Social History  . Marital Status: Divorced    Spouse Name: N/A    Number of Children: 2  . Years of Education: N/A   Occupational History  . retired     [employer]  . 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 Smoking status: Former Smoker -- 0.50 packs/day for 37 years    Types: Cigarettes    Quit date: 11/05/2001  . Smokeless tobacco: Never Used  . Alcohol Use: 0.0 oz/week     Comment: 05/25/2013 "mixed drink couple times/month"  . Drug Use: No  . Sexual Activity: No   Other Topics Concern  . None   Social History Narrative   Lives with sig other.    Allergies:  Allergies  Allergen Reactions  . Demerol [Meperidine] Other (See Comments)    Paralysis. Could only move eyes.  . Meperidine Hcl Other (See Comments)    "CAN'T MOVE" CATATONIC PER PT.  [name] Opium Other (See Comments)    "CAN'T MOVE" CATATONIC PER PT.    Objective:    Vital Signs:       There were no vitals filed for this visit.  Mean arterial Pressure   Physical Exam: GENERAL: Well appearing, male who presents to clinic today in no acute distress. Wife present HEENT: normal  NECK: Supple, JVP 8-9 . 2+ bilaterally, no bruits. No lymphadenopathy or thyromegaly appreciated.  CARDIAC: Mechanical heart sounds with LVAD hum present.  LUNGS: Clear to auscultation bilaterally.  ABDOMEN: Soft, round, nontender, positive bowel sounds x4.  LVAD exit site: well-healed and incorporated. Dressing dry and  intact. No erythema or drainage. Stabilization device present and accurately applied. Driveline dressing is being changed weekly per sterile technique. EXTREMITIES: Warm and dry, no cyanosis, clubbing, bilateral 2 + bilateral edema NEUROLOGIC: Alert and oriented x 4. Gait steady. No aphasia. No dysarthria. Affect pleasant  Telemetry: SR 70s 1st degree AV block  Labs: Basic Metabolic Panel:  Recent Labs Lab 09/09/14 1038  NA 138  K 4.6  CL 103  CO2 23  GLUCOSE 102*  BUN 28*  CREATININE 2.46*  CALCIUM 8.6    Liver Function Tests: No results for input(s): AST, ALT, ALKPHOS, BILITOT, PROT, ALBUMIN in the last 168 hours. No results for input(s): LIPASE, AMYLASE in the last 168 hours. No results for input(s): AMMONIA in the last 168 hours.  CBC:  Recent Labs Lab 09/09/14 1038  WBC 4.8  HGB 8.4*  HCT 25.2*  MCV 93.7  PLT 241    Cardiac Enzymes: No results for input(s): CKTOTAL, CKMB, CKMBINDEX, TROPONINI in the last 168 hours.  BNP: BNP (last 3 results)  Recent Labs  06/17/14 0949 07/29/14 0957 09/09/14 1038  PROBNP 2606.0* 1468.0* 5067.0*    CBG: No results for  input(s): GLUCAP in the last 168 hours.  Coagulation Studies: No results for input(s): LABPROT, INR in the last 72 hours.  Other results: EKG: atrial pacing 75  Imaging:  No results found.      Assessment:   1) A/C HF - s/p LVAD 10/2014 2) Afib/Aflutter - s/p DC-CV 06/04/14; in NSR 3) HTN 4) LVAD 5) CKD stage III 6) Hematuria 7) Anemia 8) s/p Renal stone lithotripsy  Plan/Discussion:    Mr. Tindol was seen in the the HF clinic and at that time was doing well. His Hgb was trending down, however he did not have any symptoms. His volume was up slightly along with renal function and took lasix over the weekend.    Presented today with increased SOB. Concern for anemia since he is still having hematuria. Will check CBC and transfuse if Hgb < 8.5 with symptoms.   Volume  status elevated, will give 40 mg IV lasix if renal function stable.. Had increased renal function last week and thought to be d/t renal calculi. CT showed interval fragmentation of L renal pelvis calculus, one fragment is lodged in mid ureter with mild hydronephrosis prox to calculus. Will consult urology while here had f/u appt this week. Check UA and Cx.   Pending INR. Pharmacy dosing. Will continue ASA 81 mg daily currently with hematuria but would eventually like to get back to 325 mg daily.   MAPs were elevated on admission, however had not taken medications will continue to follow. No ACE-I or Arlyce Harman with CKD.  I reviewed the LVAD parameters from today, and compared the results to the patient's prior recorded data.  No programming changes were made.  The LVAD is functioning within specified parameters.  The patient performs LVAD self-test daily.  LVAD interrogation was negative for any significant power changes, alarms or PI events/speed drops.  LVAD equipment check completed and is in good working order.  Back-up equipment present.   LVAD education done on emergency procedures and precautions and reviewed exit site care.  Length of Stay: 0  Rande Brunt 09/13/2014, 10:28 AM  VAD Team Pager [phone] (7am - 7am) +++VAD ISSUES ONLY+++   Advanced Heart Failure Team Pager [phone] (M-F; [place])  Please contact [facility] for night-coverage after hours (4p -7a ) and weekends on [email or web address] for all non- LVAD Issues   Patient seen and examined with Junie Bame, NP. We discussed all aspects of the encounter. I agree with the assessment and plan as stated above.   Hgb stable. Neck veins are up but responding to lasix. My main concern is RV failure. Will continue lasix overnight. Plan echo in am +/- RHC. Will keep NPO.  Benay Spice 6:55 PM

## 2014-09-13 NOTE — Care Management Note (Addendum)
    Page 1 of 1   09/23/2014     9:47:37 AM CARE MANAGEMENT NOTE 09/23/2014  Patient:  [name], [name]   Account Number:  [id number]  Date Initiated:  09/13/2014  Documentation initiated by:  Elissa Hefty  Subjective/Objective Assessment:   adm w shortness of breath, has lvad     Action/Plan:   lives w fam, pcp dr Brennan Bailey   Anticipated DC Date:  09/23/2014   Anticipated DC Plan:  HOME/SELF CARE         Choice offered to / List presented to:             Status of service:   Medicare Important Message given?  YES (If response is "NO", the following Medicare IM given date fields will be blank) Date Medicare IM given:  09/23/2014 Medicare IM given by:  Elissa Hefty Date Additional Medicare IM given:  09/20/2014 Additional Medicare IM given by:  [facility] Deyonte Cadden  Discharge Disposition:  HOME/SELF CARE  Per UR Regulation:  Reviewed for med. necessity/level of care/duration of stay  If discussed at [place] of Stay Meetings, dates discussed:   09/21/2014  09/23/2014    Comments:  11/9 1409 debbie Blu Lori rn,bsn hx of [facility] w [facility]. will follow for dc planning as pt progresses.

## 2014-09-13 NOTE — ED Provider Notes (Signed)
CSN: [id number]     Arrival date & time 09/13/14  7517 History   First MD Initiated Contact with Patient 09/13/14 [id number]     Chief Complaint  Patient presents with  . Shortness of Breath  . Weakness      The history is provided by the patient and medical records.   Patient complains of increasing shortness of breath and generalized weakness over the past 2 days without significant weight gain.  He has a LVAD.  He's been compliant with his medications.  No fevers or chills.  He has been dealing with a ureteral stone and still has some mild hematuria.  He denies nausea vomiting.  No flank pain or abdominal pain.  No pain with urination or burning with urination.  No syncope.  He states when he rests he feels much better.  Denies significant orthopnea.  Family reports no issues with his LVAD. Family is concerned that he looks pale today.   Past Medical History  Diagnosis Date  . Bradycardia   . Ventricular tachycardia     s/p AICD;   h/o ICD shock;  Amiodarone Rx. S/P ablation in 2012  . 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 HTN (hypertension)   . HLD (hyperlipidemia)   . Cytopenia   . Hypothyroidism   . Renal insufficiency   . Claudication   . Chronic systolic heart failure   . Ischemic cardiomyopathy     echo 7/12:  EF 25-30%, mild AI, mod MR, mod LAE  . TIA (transient ischemic attack) 2001  . CVD (cerebrovascular disease)     left CEA 2008  . Stroke   . Anemia   . Inappropriate therapy from implantable cardioverter-defibrillator 04/02/2013    ATP for sinus tach  SVT wavelet identified SVT but was no  passive   . Myocardial infarction 1992  . Anginal pain   . Automatic implantable cardioverter-defibrillator in situ     Medtronic Evera device serial number [id number]    . Arthritis     "a little in my knees" (05/25/2013)  . Melanoma of ear 2013    "near left ear" (05/25/2013)  . 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 Dysrhythmia     HX A FIB  . History of nonmelanoma skin cancer     L EAR  . Kidney stone   . History of transfusion   . Sleep apnea     denies wearing mask (05/25/2013)  . CAD (coronary artery disease)     s/p MI 1982;  s/p DES to CFX 2011;  s/p NSTEMI 4/12 in setting of VTach;  cath 7/12:  LM ok, LAD mid 30-40%, Dx's with 40%, mCFX stent patent, prox to mid RCA occluded with R to R and L to R collats.  Medical Tx was continued  . CHF (congestive heart failure)     HAS VENTRICULAR PUMP   Past Surgical History  Procedure Laterality Date  . Femoral-popliteal bypass graft Right 2011  . Cardiac defibrillator placement  2003; 2005; 05/25/2013    2014: Medtronic Evera device serial number [id number]  . Revascularization / in-situ graft leg    . Renal artery bypass Bilateral 2003  . Back surgery    . Lumbar disc surgery  1974; 2000's  . Knee arthroscopy Bilateral  1990's  . Elbow arthroscopy Right 1990's  . Cholecystectomy  12/15/?2010  . Lipoma excision Left 2013    "near ear" (05/25/2013)  . Heel spur surgery Right 1990's  . Cataract extraction w/ intraocular lens  implant, bilateral Bilateral 2000  . Carotid endarterectomy Left ~ 2007  . Doppler echocardiography  2011  . Tonsillectomy  1946  . Femoral-popliteal bypass graft Left 05/2002    Archie Endo 05/06/2002 (05/25/2013)  . Tumor excision Left 1960's    "fatty tumor" (05/25/2013)  . Cardiac electrophysiology study and ablation  2012  . Coronary angioplasty  1992  . Cardiac catheterization      "several" (05/25/2013)  . Coronary angioplasty with stent placement      "last one was 11/2012  (05/25/2013)  . Cardioversion N/A 10/15/2013    Procedure: CARDIOVERSION;  Surgeon: Jolaine Artist, MD;  Location: [place];  Service: Cardiovascular;  Laterality: N/A;  . Insertion of implantable left ventricular assist device N/A 10/19/2013    Procedure: INSERTION OF IMPLANTABLE  LEFT VENTRICULAR ASSIST DEVICE;  Surgeon: Gaye Pollack, MD;  Location: [place];  Service: Open Heart Surgery;  Laterality: N/A;  CIRC ARREST  NITRIC OXIDE  MEDTRONIC ICD  . Intraoperative transesophageal echocardiogram N/A 10/19/2013    Procedure: INTRAOPERATIVE TRANSESOPHAGEAL ECHOCARDIOGRAM;  Surgeon: Gaye Pollack, MD;  Location: [facility];  Service: Open Heart Surgery;  Laterality: N/A;  . Tee without cardioversion N/A 11/04/2013    Procedure: TRANSESOPHAGEAL ECHOCARDIOGRAM (TEE);  Surgeon: Larey Dresser, MD;  Location: [place];  Service: Cardiovascular;  Laterality: N/A;  . Cardioversion N/A 11/04/2013    Procedure: CARDIOVERSION;  Surgeon: Larey Dresser, MD;  Location: [place];  Service: Cardiovascular;  Laterality: N/A;  . Cardioversion N/A 06/04/2014    Procedure: CARDIOVERSION;  Surgeon: Jolaine Artist, MD;  Location: [facility] ENDOSCOPY;  Service: Cardiovascular;  Laterality: N/A;  . Cystoscopy with retrograde pyelogram, ureteroscopy and stent placement Left 08/09/2014    Procedure: [place], URETEROSCOPY AND STENT PLACEMENT;  Surgeon: Bernestine Amass, MD;  Location: [facility];  Service: Urology;  Laterality: Left;  . Cystoscopy with retrograde pyelogram, ureteroscopy and stent placement Left 09/01/2014    Procedure: CYSTOSCOPY WITH URETEROSCOPY AND STENT REMOVAL;  Surgeon: Bernestine Amass, MD;  Location: [facility];  Service: Urology;  Laterality: Left;  . Holmium laser application Left 32/20/2542    Procedure: HOLMIUM LASER APPLICATION;  Surgeon: Bernestine Amass, MD;  Location: [facility];  Service: Urology;  Laterality: Left;   Family History  Problem Relation Age of Onset  . Coronary artery disease    . Heart attack Mother   . Coronary artery disease Mother   . Cancer Mother   . Heart disease Mother   . Hyperlipidemia Mother   . Hypertension Mother   . Coronary artery disease Father   . Stroke Father   . Cancer Father   . Heart disease Father      before age 68  . Hyperlipidemia Father   . Hypertension Father   . Heart attack Father    History  Substance Use Topics  . Smoking status: Former Smoker -- 0.50 packs/day for 37 years    Types: Cigarettes    Quit date: 11/05/2001  . Smokeless tobacco: Never Used  . Alcohol Use: 0.0 oz/week     Comment: 05/25/2013 "mixed drink couple times/month"    Review of Systems  All other systems reviewed and are negative.     Allergies  Demerol; Meperidine hcl;  and Opium  Home Medications   Prior to Admission medications   Medication Sig Start Date End Date Taking? Authorizing Provider  amiodarone (PACERONE) 200 MG tablet Take 200 mg by mouth 2 (two) times daily. 06/03/14   Jolaine Artist, MD  amLODipine (NORVASC) 10 MG tablet Take 10 mg by mouth every evening.    Historical Provider, MD  aspirin 81 MG tablet Take 81 mg by mouth every morning.     Historical Provider, MD  atorvastatin (LIPITOR) 80 MG tablet Take 80 mg by mouth every morning.    Historical Provider, MD  CALCIUM-MAGNESIUM-ZINC PO Take 3 tablets by mouth every evening. 1000 mg calcium, 400 mg magnesium, 25 mg zinc    Historical Provider, MD  cephALEXin (KEFLEX) 250 MG capsule Take 1 capsule (250 mg total) by mouth daily. 09/09/14   Jolaine Artist, MD  docusate sodium (COLACE) 100 MG capsule Take 200 mg by mouth every morning.    Historical Provider, MD  ferrous gluconate (FERGON) 324 MG tablet Take 1 tablet (324 mg total) by mouth 3 (three) times daily with meals. 06/07/14   Jolaine Artist, MD  furosemide (LASIX) 20 MG tablet Take 20 mg by mouth 2 (two) times a week.     Historical Provider, MD  hydrALAZINE (APRESOLINE) 25 MG tablet Take 0.5 tablets (12.5 mg total) by mouth 3 (three) times daily. 07/29/14   Rande Brunt, NP  HYDROcodone-acetaminophen (NORCO/VICODIN) 5-325 MG per tablet Take 1-2 tablets by mouth every 6 (six) hours as needed. 08/09/14   Bernestine Amass, MD  levothyroxine (SYNTHROID) 125 MCG tablet Take  1 tablet (125 mcg total) by mouth daily. 04/01/14   Deboraha Sprang, MD  Multiple Vitamin (MULTIVITAMIN) tablet Take 1 tablet by mouth at bedtime.     Historical Provider, MD  Multiple Vitamins-Minerals (ICAPS PO) Take 1 tablet by mouth 2 (two) times daily.    Historical Provider, MD  nitroGLYCERIN (NITROSTAT) 0.4 MG SL tablet Place 0.4 mg under the tongue every 5 (five) minutes as needed for chest pain. For chest pain    Historical Provider, MD  pantoprazole (PROTONIX) 40 MG tablet TAKE 1 TABLET (40 MG TOTAL) BY MOUTH DAILY. 09/01/14   Jolaine Artist, MD  ranolazine (RANEXA) 500 MG 12 hr tablet Take 500 mg by mouth 2 (two) times daily.    Historical Provider, MD  warfarin (COUMADIN) 2 MG tablet Take 2-4 mg by mouth at bedtime. Take 2 tablets (4 mg) on Monday,  and Friday, take 1 tablet (2 mg) on Sunday, Tuesday,Wednesday, Thursday and Saturday    Historical Provider, MD   BP  Physical Exam  Constitutional: He is oriented to person, place, and time. He appears well-developed and well-nourished.  HENT:  Head: Normocephalic and atraumatic.  Eyes: EOM are normal.  Neck: Normal range of motion.  Cardiovascular: Normal rate, regular rhythm, normal heart sounds and intact distal pulses.   Pulmonary/Chest: Effort normal and breath sounds normal. No respiratory distress.  LVAD components without significant abnormality.  No erythema around the insertion sites  Abdominal: Soft. He exhibits no distension. There is no tenderness.  Musculoskeletal: Normal range of motion. He exhibits no edema.  Neurological: He is alert and oriented to person, place, and time.  Skin: Skin is warm and dry. There is pallor.  Psychiatric: He has a normal mood and affect. Judgment normal.  Nursing note and vitals reviewed.   ED Course  Procedures (including critical care time)    Labs Review Labs  Reviewed  CBC WITH DIFFERENTIAL - Abnormal; Notable for the following:    RBC 2.72 (*)    Hemoglobin 8.6 (*)    HCT  26.3 (*)    RDW 16.7 (*)    Monocytes Relative 13 (*)    All other components within normal limits  PRO B NATRIURETIC PEPTIDE - Abnormal; Notable for the following:    Pro B Natriuretic peptide (BNP) 5152.0 (*)    All other components within normal limits  PROTIME-INR - Abnormal; Notable for the following:    Prothrombin Time 27.8 (*)    INR 2.57 (*)    All other components within normal limits  TROPONIN I  LACTIC ACID, PLASMA  COMPREHENSIVE METABOLIC PANEL  LACTATE DEHYDROGENASE  TYPE AND SCREEN    Imaging Review Dg Chest Portable 1 View  09/13/2014   CLINICAL DATA:  Shortness of breath began last night, cough and congestion, former smoker, personal history of hypertension, COPD, coronary artery disease post MI, CHF, ischemic cardiomyopathy, stroke, LEFT ventricular assist device, melanoma  EXAM: PORTABLE CHEST - 1 VIEW  COMPARISON:  Portable exam 1039 hr compared to 11/08/2013  FINDINGS: LEFT subclavian AICD leads project over RIGHT atrium and RIGHT ventricle.  LEFT ventricular assist device again identified.  Enlargement of cardiac silhouette with slight pulmonary vascular congestion.  Atherosclerotic calcification aorta.  Chronic blunting of the LEFT costophrenic angle question tiny chronic effusion versus scarring.  Remaining lungs grossly clear without definite evidence of pulmonary edema.  No pneumothorax.  Bones demineralized.  IMPRESSION: Enlargement of cardiac silhouette post LVAD and AICD.  Slight pulmonary vascular congestion without acute failure or consolidation.   Electronically Signed   By: Lavonia Dana M.D.   On: 09/13/2014 11:00  I personally reviewed the imaging tests through PACS system I reviewed available ER/hospitalization records through the EMR    EKG Interpretation   Date/Time:  Monday September 13 2014 09:58:28 EST Ventricular Rate:  75 PR Interval:  248 QRS Duration: 126 QT Interval:  472 QTC Calculation: 527 R Axis:   106 Text Interpretation:  Sinus  rhythm with 1st degree A-V block with  occasional Premature ventricular complexes and Fusion complexes  Non-specific intra-ventricular conduction block Possible Anterolateral  infarct , age undetermined Abnormal ECG No significant change was found  Confirmed by Dominic Rhome  MD, Lennette Bihari ([id number]) on 09/13/2014 10:13:50 AM      MDM   Final diagnoses:  None    I suspect the patient will be anemic as a cause of his shortness of breath.  His map is124 which is high for him as he normally runs in the 90s.  I think his LVAD is working appropriately.  Type and screen ordered.  I suspect he will need a blood transfusion.  If he is anemic only to Hemoccult him as he always has dark stools secondary to iron.  I don't think there is an active issue with his ureteral stone causing his symptoms today.    Hoy Morn, MD 09/13/14 1124

## 2014-09-13 NOTE — Consult Note (Signed)
ANTICOAGULATION CONSULT NOTE - Initial Consult  Pharmacy Consult for Coumadin Indication: LVAD  Allergies  Allergen Reactions  . Demerol [Meperidine] Other (See Comments)    Paralysis. Could only move eyes.  . Meperidine Hcl Other (See Comments)    "CAN'T MOVE" CATATONIC PER PT.  [name] Opium Other (See Comments)    "CAN'T MOVE" CATATONIC PER PT.    Patient Measurements: Height: 6' (182.9 cm) Weight: 196 lb 3.4 oz (89 kg) IBW/kg (Calculated) : 77.6  Vital Signs: Temp: 97.7 F (36.5 C) (11/09 1349) Temp Source: Oral (11/09 1349) Pulse Rate: 72 (11/09 1349)  Labs:  Recent Labs  09/13/14 1024  HGB 8.6*  HCT 26.3*  PLT 317  LABPROT 27.8*  INR 2.57*  CREATININE 2.01*  TROPONINI <0.30    Estimated Creatinine Clearance: 35.4 mL/min (by C-G formula based on Cr of 2.01).   Medical History: Past Medical History  Diagnosis Date  . Bradycardia   . Ventricular tachycardia     s/p AICD;   h/o ICD shock;  Amiodarone Rx. S/P ablation in 2012  . 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 HTN (hypertension)   . HLD (hyperlipidemia)   . Cytopenia   . Hypothyroidism   . Renal insufficiency   . Claudication   . Chronic systolic heart failure   . Ischemic cardiomyopathy     echo 7/12:  EF 25-30%, mild AI, mod MR, mod LAE  . TIA (transient ischemic attack) 2001  . CVD (cerebrovascular disease)     left CEA 2008  . Stroke   . Anemia   . Inappropriate therapy from implantable cardioverter-defibrillator 04/02/2013    ATP for sinus tach  SVT wavelet identified SVT but was no  passive   . Myocardial infarction 1992  . Anginal pain   . Automatic implantable cardioverter-defibrillator in situ     Medtronic Evera device serial number [id number]    . Arthritis     "a little in my knees" (05/25/2013)  . Melanoma of ear 2013    "near left ear" (05/25/2013)   . 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 Dysrhythmia     HX A FIB  . History of nonmelanoma skin cancer     L EAR  . Kidney stone   . History of transfusion   . Sleep apnea     denies wearing mask (05/25/2013)  . CAD (coronary artery disease)     s/p MI 1982;  s/p DES to CFX 2011;  s/p NSTEMI 4/12 in setting of VTach;  cath 7/12:  LM ok, LAD mid 30-40%, Dx's with 40%, mCFX stent patent, prox to mid RCA occluded with R to R and L to R collats.  Medical Tx was continued  . CHF (congestive heart failure)     HAS VENTRICULAR PUMP   Assessment: 72yom s/p LVAD (10/19/13) on coumadin pta, admitted with SOB and weakness. INR on admission is therapeutic at 2.57. Hgb is stable though patient having hematuria. Coumadin to resume. Was on amiodarone 200mg  bid pta which will also be resumed here.  Home dose: 2mg  daily except 4mg  Mon/Fri  Goal of Therapy:  INR 2-3 Monitor platelets by anticoagulation protocol: Yes   Plan:  1) Coumadin 2mg  daily except 4mg  Mon/Fri 2) Daily INR  Deboraha Sprang 09/13/2014,2:10  PM   

## 2014-09-13 NOTE — ED Notes (Signed)
Pt is LVAD pt c/o increased SOB and weakness x 2 days worse with exertion and some swelling in feet

## 2014-09-13 NOTE — ED Notes (Signed)
MD at bedside. LVAD RN TEAM

## 2014-09-14 ENCOUNTER — Inpatient Hospital Stay (HOSPITAL_COMMUNITY): Payer: Medicare Other

## 2014-09-14 DIAGNOSIS — I5023 Acute on chronic systolic (congestive) heart failure: Principal | ICD-10-CM

## 2014-09-14 DIAGNOSIS — I059 Rheumatic mitral valve disease, unspecified: Secondary | ICD-10-CM

## 2014-09-14 LAB — BASIC METABOLIC PANEL
Anion gap: 10 (ref 5–15)
BUN: 25 mg/dL — ABNORMAL HIGH (ref 6–23)
CHLORIDE: 102 meq/L (ref 96–112)
CO2: 28 mEq/L (ref 19–32)
Calcium: 8.5 mg/dL (ref 8.4–10.5)
Creatinine, Ser: 2.52 mg/dL — ABNORMAL HIGH (ref 0.50–1.35)
GFR, EST AFRICAN AMERICAN: 27 mL/min — AB (ref >90)
GFR, EST NON AFRICAN AMERICAN: 24 mL/min — AB (ref >90)
GLUCOSE: 96 mg/dL (ref 70–99)
POTASSIUM: 4.8 meq/L (ref 3.7–5.3)
SODIUM: 140 meq/L (ref 137–147)

## 2014-09-14 LAB — CBC
HEMATOCRIT: 23.5 % — AB (ref 39.0–52.0)
Hemoglobin: 7.6 g/dL — ABNORMAL LOW (ref 13.0–17.0)
MCH: 30.8 pg (ref 26.0–34.0)
MCHC: 32.3 g/dL (ref 30.0–36.0)
MCV: 95.1 fL (ref 78.0–100.0)
PLATELETS: 296 10*3/uL (ref 150–400)
RBC: 2.47 MIL/uL — AB (ref 4.22–5.81)
RDW: 16.6 % — ABNORMAL HIGH (ref 11.5–15.5)
WBC: 5.9 10*3/uL (ref 4.0–10.5)

## 2014-09-14 LAB — PROTIME-INR
INR: 3.23 — AB (ref 0.00–1.49)
Prothrombin Time: 33.2 seconds — ABNORMAL HIGH (ref 11.6–15.2)

## 2014-09-14 LAB — LACTATE DEHYDROGENASE: LDH: 281 U/L — AB (ref 94–250)

## 2014-09-14 LAB — PREPARE RBC (CROSSMATCH)

## 2014-09-14 MED ORDER — SODIUM CHLORIDE 0.9 % IV SOLN
Freq: Once | INTRAVENOUS | Status: AC
  Administered 2014-09-14: 09:00:00 via INTRAVENOUS

## 2014-09-14 MED ORDER — HYDRALAZINE HCL 25 MG PO TABS
25.0000 mg | ORAL_TABLET | Freq: Three times a day (TID) | ORAL | Status: DC
  Administered 2014-09-14 (×3): 25 mg via ORAL
  Filled 2014-09-14 (×6): qty 1

## 2014-09-14 MED ORDER — MILRINONE IN DEXTROSE 20 MG/100ML IV SOLN: 0.2500 ug/kg/min | INTRAVENOUS | Status: DC

## 2014-09-14 NOTE — Consult Note (Signed)
ANTICOAGULATION CONSULT NOTE - Follow up [place] for Coumadin Indication: LVAD  Allergies  Allergen Reactions  . Demerol [Meperidine] Other (See Comments)    Paralysis. Could only move eyes.  . Meperidine Hcl Other (See Comments)    "CAN'T MOVE" CATATONIC PER PT.  [name] Opium Other (See Comments)    "CAN'T MOVE" CATATONIC PER PT.    Patient Measurements: Height: 6' (182.9 cm) Weight: 191 lb 9.3 oz (86.9 kg) IBW/kg (Calculated) : 77.6  Vital Signs: Temp: 98.4 F (36.9 C) (11/10 1210) Temp Source: Oral (11/10 1210) Pulse Rate: 75 (11/10 1300)  Labs:  Recent Labs  09/13/14 1024 09/14/14 0156  HGB 8.6* 7.6*  HCT 26.3* 23.5*  PLT 317 296  LABPROT 27.8* 33.2*  INR 2.57* 3.23*  CREATININE 2.01* 2.52*  TROPONINI <0.30  --     Estimated Creatinine Clearance: 28.2 mL/min (by C-G formula based on Cr of 2.52).  Assessment: 72yom s/p LVAD (10/19/13) continues on coumadin. INR is supratherapeutic today on his home dose. Hgb has dropped as well and he is receiving 1 unit PRBCs. Still with a lot of hematuria.  Home dose: 2mg  daily except 4mg  Mon/Fri  Goal of Therapy:  INR 2-3 Monitor platelets by anticoagulation protocol: Yes   Plan:  1) Hold coumadin tonight 2) INR in AM  Deboraha Sprang 09/14/2014,1:21 PM

## 2014-09-14 NOTE — Progress Notes (Signed)
  Echocardiogram 2D Echocardiogram has been performed.  Whitley Strycharz [name] 09/14/2014, 11:33 AM

## 2014-09-14 NOTE — Progress Notes (Addendum)
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 On 09/01/14 underwent cystoscopy and flexible ureteroscopy with lithotripsy and basket extraction.   VAD implantation was complicated by VT, syncope, A fib/Aflutter and persistent low output felt to be due to RHF. RHC showed low cardiac output but no evidence of RV failure on Milrinone. He was felt to be volume depleted. He was discharged home on 11/18/13 on mirlinone and PRN lasix 20 mg for a weight of 200 pounds or greater. Milrinone has since been weaned off.  On 09/01/14 underwent cystoscopy and flexible ureteroscopy with lithotripsy and basket extraction. Seen in the clinic last week and was doing well. He was still having some hematuria and Hgb was trending down. Volume status was mildly elevated and told to take lasix. Renal function was up and underwent CT of abdomen and pelvis: interval fragmentation of L renal pelvis calculus, one fragment is lodged in mid ureter with mild hydronephrosis prox to calculus.  Presented to ED with SOB. Received 80 mg IV lasix x 1 yesterday. Weight down 5 lbs and 24 hr I/O -3.9 liters. Renal function up to 2.5. Hgb down to 7.6. +hematuria. MAPs 90-100s. Denies SOB, orthopnea or CP.   LVAD INTERROGATION:  HeartMate II LVAD:  Flow 5.1 liters/min, speed 9200, power 5.5, PI 6.8.  1 PI event  Objective:    Vital Signs:   Temp:  [97.7 F (36.5 C)-98.5 F (36.9 C)] 98.5 F (36.9 C) (11/10 0719) Pulse Rate:  [72-77] 75 (11/10 0719) Resp:  [9-20] 13 (11/10 0719) SpO2:  [90 %-100 %] 95 % (11/10 0719) Weight:  [191 lb 9.3 oz (86.9 kg)-196 lb 3.4 oz (89 kg)] 191 lb 9.3 oz (86.9 kg) (11/10 0600) Last BM Date: 09/12/14 Mean arterial Pressure 90-110s  Intake/Output:   Intake/Output Summary (Last 24 hours) at 09/14/14 0802 Last  data filed at 09/14/14 0700  Gross per 24 hour  Intake    580 ml  Output   4500 ml  Net  -3920 ml     Physical Exam: General:  Well appearing. No resp difficulty, lying flat in bed HEENT: normal Neck: supple. JVP 8 with CV waves . Carotids 2+ bilat; no bruits. No lymphadenopathy or thryomegaly appreciated. Cor: Mechanical heart sounds with LVAD hum present. Lungs: clear Abdomen: soft, nontender, nondistended. No hepatosplenomegaly. No bruits or masses. Good bowel sounds. Driveline: C/D/I; securement device intact and driveline incorporated Extremities: no cyanosis, clubbing, rash, edema Neuro: alert & orientedx3, cranial nerves grossly intact. moves all 4 extremities w/o difficulty. Affect pleasant  Telemetry: SR 70s  Labs: Basic Metabolic Panel:  Recent Labs Lab 09/09/14 1038 09/13/14 1024 09/14/14 0156  NA 138 139 140  K 4.6 4.1 4.8  CL 103 100 102  CO2 23 25 28   GLUCOSE 102* 103* 96  BUN 28* 24* 25*  CREATININE 2.46* 2.01* 2.52*  CALCIUM 8.6 8.8 8.5    Liver Function Tests:  Recent Labs Lab 09/13/14 1024  AST 24  ALT 15  ALKPHOS 106  BILITOT 0.4  PROT 6.7  ALBUMIN 3.6   No results for input(s): LIPASE, AMYLASE in the last 168 hours. No results for input(s): AMMONIA in the last 168 hours.  CBC:  Recent Labs Lab 09/09/14 1038 09/13/14 1024 09/14/14 0156  WBC 4.8 5.3 5.9  NEUTROABS  --  3.7  --   HGB 8.4* 8.6* 7.6*  HCT 25.2* 26.3* 23.5*  MCV 93.7 96.7 95.1  PLT 241 317 296    INR:  Recent Labs Lab 09/09/14 1038 09/13/14 1024 09/14/14 0156  INR 2.27* 2.57* 3.23*    Other results:  EKG:   Imaging: Dg Chest Portable 1 View  09/13/2014   CLINICAL DATA:  Shortness of breath began last night, cough and congestion, former smoker, personal history of hypertension, COPD, coronary artery disease post MI, CHF, ischemic cardiomyopathy, stroke, LEFT ventricular assist device, melanoma  EXAM: PORTABLE CHEST - 1 VIEW  COMPARISON:  Portable exam  1039 hr compared to 11/08/2013  FINDINGS: LEFT subclavian AICD leads project over RIGHT atrium and RIGHT ventricle.  LEFT ventricular assist device again identified.  Enlargement of cardiac silhouette with slight pulmonary vascular congestion.  Atherosclerotic calcification aorta.  Chronic blunting of the LEFT costophrenic angle question tiny chronic effusion versus scarring.  Remaining lungs grossly clear without definite evidence of pulmonary edema.  No pneumothorax.  Bones demineralized.  IMPRESSION: Enlargement of cardiac silhouette post LVAD and AICD.  Slight pulmonary vascular congestion without acute failure or consolidation.   Electronically Signed   By: Lavonia Dana M.D.   On: 09/13/2014 11:00      Medications:     Scheduled Medications: . amiodarone  200 mg Oral BID  . amLODipine  10 mg Oral Daily  . aspirin  81 mg Oral Daily  . atorvastatin  80 mg Oral q1800  . docusate sodium  200 mg Oral Daily  . ferrous gluconate  324 mg Oral TID WC  . hydrALAZINE  12.5 mg Oral TID  . levothyroxine  125 mcg Oral QAC breakfast  . pantoprazole  40 mg Oral Daily  . ranolazine  500 mg Oral BID  . warfarin  2 mg Oral Once per day on Sun Tue Wed Thu Sat  . warfarin  4 mg Oral Once per day on Mon Fri  . Warfarin - Pharmacist Dosing Inpatient   Does not apply q1800     Infusions:     PRN Medications:  acetaminophen **OR** acetaminophen   Assessment:   1) A/C HF - s/p LVAD 10/2014 2) Afib/Aflutter - s/p DC-CV 06/04/14; in NSR 3) HTN 4) LVAD 5) CKD stage III 6) Hematuria 7) Anemia 8) s/p Renal stone   Plan/Discussion:    [name] presented to the ED with increased SOB. His volume status was elevated and he was given 80 mg IV lasix. Weight down 5 lbs and 24 hr I/O -3.9 liters. Creatinine elevated will hold diuretics.  Concern for RV failure. He does have CV waves and has required milrinone in the past. Will get Echo to assess RV and may need RHC to assess hemodynamics. Discussed  with Dr. Risa Grill and patient can have rise in creatinine sometimes with not a lot of hydronephrosis. Will get renal US to reassess for hydronephrosis and then based on results will decide whether to start empiric milrinone.   Hgb trending down and 7.6 this am. Will transfuse 1 PRBC. Still having a lot of hematuria. Will need to consult urology to see if there is anything else to do with renal calculus in mid ureter. UA and Ucx ok.  INR elevated will hold coumadin today, pharmacy dosing. On lower dose ASA 81 mg right now. Eventually would like to get back to 325 mg.   MAPs elevated. Will increase hydralazine to 25 mg TID and continue  to follow. No ACE-I or Arlyce Harman with CKD.   I reviewed the LVAD parameters from today, and compared the results to the patient's prior recorded data.  No programming changes were made.  The LVAD is functioning within specified parameters.  The patient performs LVAD self-test daily.  LVAD interrogation was negative for any significant power changes, alarms or PI events/speed drops.  LVAD equipment check completed and is in good working order.  Back-up equipment present.   LVAD education done on emergency procedures and precautions and reviewed exit site care.  Length of Stay: 1  Rande Brunt NP-C 09/14/2014, 8:02 AM  VAD Team --- VAD ISSUES ONLY--- Pager [phone] (7am - 7am)  Advanced Heart Failure Team  Pager [phone] (M-F; 7a - 4p)  Please contact [facility] for night-coverage after hours (4p -7a ) and weekends on [email or web address]   Patient seen and examined with Junie Bame, NP. We discussed all aspects of the encounter. I agree with the assessment and plan as stated above.   He has diuresed 5 pounds and looks much better but renal function worse. Unclear if this is an obstructtive uropathy or due to RV failure. Given good MAPs and minimal PI events on VAD suspect may be obstructive. Would hold lasix today. Check echo and renal u/s. If no significant hydro on  renal u/s can consider brief trial of milrinone. D/w Dr. Risa Grill in Urology.   Sherlyn Ebbert,MD 8:47 AM

## 2014-09-15 DIAGNOSIS — N189 Chronic kidney disease, unspecified: Secondary | ICD-10-CM

## 2014-09-15 DIAGNOSIS — I5022 Chronic systolic (congestive) heart failure: Secondary | ICD-10-CM

## 2014-09-15 DIAGNOSIS — N200 Calculus of kidney: Secondary | ICD-10-CM

## 2014-09-15 DIAGNOSIS — N179 Acute kidney failure, unspecified: Secondary | ICD-10-CM

## 2014-09-15 LAB — TYPE AND SCREEN
ABO/RH(D): O POS
Antibody Screen: NEGATIVE
UNIT DIVISION: 0

## 2014-09-15 LAB — BASIC METABOLIC PANEL
Anion gap: 11 (ref 5–15)
BUN: 26 mg/dL — AB (ref 6–23)
CHLORIDE: 103 meq/L (ref 96–112)
CO2: 25 meq/L (ref 19–32)
Calcium: 8.8 mg/dL (ref 8.4–10.5)
Creatinine, Ser: 2.35 mg/dL — ABNORMAL HIGH (ref 0.50–1.35)
GFR calc Af Amer: 30 mL/min — ABNORMAL LOW (ref >90)
GFR, EST NON AFRICAN AMERICAN: 26 mL/min — AB (ref >90)
GLUCOSE: 103 mg/dL — AB (ref 70–99)
POTASSIUM: 5.5 meq/L — AB (ref 3.7–5.3)
SODIUM: 139 meq/L (ref 137–147)

## 2014-09-15 LAB — LACTATE DEHYDROGENASE: LDH: 264 U/L — AB (ref 94–250)

## 2014-09-15 LAB — CBC
HEMATOCRIT: 30.3 % — AB (ref 39.0–52.0)
HEMOGLOBIN: 9.7 g/dL — AB (ref 13.0–17.0)
MCH: 30.9 pg (ref 26.0–34.0)
MCHC: 32 g/dL (ref 30.0–36.0)
MCV: 96.5 fL (ref 78.0–100.0)
Platelets: 304 10*3/uL (ref 150–400)
RBC: 3.14 MIL/uL — AB (ref 4.22–5.81)
RDW: 16.8 % — ABNORMAL HIGH (ref 11.5–15.5)
WBC: 6.2 10*3/uL (ref 4.0–10.5)

## 2014-09-15 LAB — PROTIME-INR
INR: 3.37 — ABNORMAL HIGH (ref 0.00–1.49)
Prothrombin Time: 34.4 seconds — ABNORMAL HIGH (ref 11.6–15.2)

## 2014-09-15 MED ORDER — FUROSEMIDE 20 MG PO TABS
20.0000 mg | ORAL_TABLET | ORAL | Status: DC
  Administered 2014-09-15: 20 mg via ORAL
  Filled 2014-09-15: qty 1

## 2014-09-15 MED ORDER — HYDRALAZINE HCL 25 MG PO TABS
37.5000 mg | ORAL_TABLET | Freq: Three times a day (TID) | ORAL | Status: DC
  Filled 2014-09-15 (×3): qty 1.5

## 2014-09-15 MED ORDER — HYDRALAZINE HCL 25 MG PO TABS
25.0000 mg | ORAL_TABLET | Freq: Three times a day (TID) | ORAL | Status: DC
  Administered 2014-09-15 – 2014-09-23 (×25): 25 mg via ORAL
  Filled 2014-09-15 (×28): qty 1

## 2014-09-15 MED ORDER — ASPIRIN EC 325 MG PO TBEC
325.0000 mg | DELAYED_RELEASE_TABLET | Freq: Every day | ORAL | Status: DC
  Administered 2014-09-15 – 2014-09-23 (×9): 325 mg via ORAL
  Filled 2014-09-15 (×10): qty 1

## 2014-09-15 NOTE — Progress Notes (Signed)
Subjective: Patient reports ongoing gross hematuria. The intensity of the hematuria has waxed and waned. He has had absolutely 0 abdominal or flank pain. I reviewed his recent imaging studies including CT scan from several days ago and more recent renal ultrasound. Creatinine has remained elevated but has improved somewhat today. Urine output remains adequate. He has had no difficulty with voiding. He feels better today and overall is improving. He is however frustrated by the ongoing issues and will like to have something more definitive done sooner rather than later if at all possible.   Objective: Vital signs in last 24 hours: Temp:  [97.7 F (36.5 C)-98.4 F (36.9 C)] 97.7 F (36.5 C) (11/11 1100) Pulse Rate:  [73-91] 74 (11/11 0800) Resp:  [10-22] 16 (11/11 1200) SpO2:  [84 %-100 %] 98 % (11/11 1200) Weight:  [86.7 kg (191 lb 2.2 oz)] 86.7 kg (191 lb 2.2 oz) (11/11 0459)  Intake/Output from previous day: 11/10 0701 - 11/11 0700 In: 1175 [P.O.:820; I.V.:17; Blood:338] Out: 8588 [Urine:1475] Intake/Output this shift: Total I/O In: 120 [P.O.:120] Out: 850 [Urine:850]  Physical Exam:  Constitutional: Vital signs reviewed. WD WN in NAD   Abdominal: Soft. Non-tender, non-distended, bowel sounds are normal, no masses, organomegaly, or guarding present.  Genitourinary:no change Extremities: No cyanosis or edema   Lab Results:  Recent Labs  09/13/14 1024 09/14/14 0156 09/15/14 0315  HGB 8.6* 7.6* 9.7*  HCT 26.3* 23.5* 30.3*   BMET  Recent Labs  09/14/14 0156 09/15/14 0315  NA 140 139  K 4.8 5.5*  CL 102 103  CO2 28 25  GLUCOSE 96 103*  BUN 25* 26*  CREATININE 2.52* 2.35*  CALCIUM 8.5 8.8    Recent Labs  09/13/14 1024 09/14/14 0156 09/15/14 0315  INR 2.57* 3.23* 3.37*   No results for input([name]): LABURIN in the last 72 hours. Results for orders placed or performed during the hospital encounter of 09/13/14  MRSA PCR Screening     Status: None   Collection  Time: 09/13/14  1:45 PM  Result Value Ref Range Status   MRSA by PCR NEGATIVE NEGATIVE Final    Comment:        The GeneXpert MRSA Assay (FDA approved for NASAL specimens only), is one component of a comprehensive MRSA colonization surveillance program. It is not intended to diagnose MRSA infection nor to guide or monitor treatment for MRSA infections.     Studies/Results: US Renal Port  09/14/2014   CLINICAL DATA:  Renal dysfunction.  EXAM: RENAL/URINARY TRACT ULTRASOUND COMPLETE  COMPARISON:  09/09/2014  FINDINGS: Right Kidney:  Length: 10.8 cm. Two cysts are identified within the interpolar region of the right kidney. The largest measures 5.7 x 5.7 x 5.4 cm. Echogenicity within normal limits. No mass or hydronephrosis visualized.  Left Kidney:  Length: 11.8 cm. Complicated cyst within the midpole of left kidney containing mural calcifications an internal septation is again noted. This measures 3 x 3.4 x 2.9 cm. Echogenicity within normal limits. Mild left hydronephrosis is again noted.  Bladder:  Appears normal for degree of bladder distention.  IMPRESSION: 1. Mild left hydronephrosis. 2. Bilateral renal cysts 3. Cyst within the left kidney is complicated by mural calcifications and internal septation.   Electronically Signed   By: Kerby Moors M.D.   On: 09/14/2014 15:55    Assessment/Plan:   [name] has multiple ureteral stone fragments status post holmium laser lithotripsy of his very large renal pelvic stone. He is currently without any abdominal or  flank pain and renal ultrasound shows minimal hydronephrosis. For that reason I do not think he has high-grade obstruction but certainly may have some partial obstruction of that kidney. His renal insufficiency may certainly be at least in part secondary to the partial obstruction. The fragments he has currently are certainly potentially passable but the overall chance of him clearing all these pieces is probably no better than  50-50. He would favor a repeat procedure to try to definitively manage the ongoing and remaining stone fragments. Again given his previous ureteral narrowing this could be difficult but certainly be worth attempting for him. He would definitely prefer going ahead with an attempt at definitive management rather than continued observation. We will need to make sure that he is deemed a candidate for another anesthetic procedure currently by his heart failure team. We will need to attempt to schedule a procedure for some time in the near future. I do think ultimately will be in his best interest to have him [facility] to [place] [facility] for the procedure and then potentially [facility] back to the heart care center. I will look at scheduling options. We will also need to coordinate with anesthetic services.   LOS: 2 days   [name] 09/15/2014, 12:40 PM

## 2014-09-15 NOTE — Clinical Documentation Improvement (Signed)
Presents with Acute on Chronic Systolic CHF with RVF, anemia documented; has LVAD.   Patient had cystoscopy the end of October  Hematuria documented with falling HGB  H&H 7.6 / 23.5 upon presentation  Transfused 1u PRBC's  Repeat draw revealing H&H now 9.7 / 30.3  Please clarify the type of anemia the patient has and document findings in next progress note and include in discharge summary.  Acute Blood Loss Anemia Chronic Blood Loss Anemia Acute on chronic blood loss anemia Other Condition  Thank You, Zoila Shutter ,RN Clinical Documentation Specialist:  [facility]

## 2014-09-15 NOTE — Progress Notes (Signed)
CARDIAC REHAB PHASE I   PRE:  Rate/Rhythm: 75 SR    BP: sitting 78    SaO2: 98 RA  MODE:  Ambulation: 500 ft   POST:  Rate/Rhythm: 82    BP: sitting 100     SaO2: 98 RA  Pt feeling better. On batteries already, reports some SOB going to BR. Steady walking, reports weakness in legs from lack of activity. Able to walk 500 ft, tired toward end with some SOB. VSS with increase in MAP (78 pre to 100 post). Feels good. Will f/u as time allows. [id number]  Josephina Shih [place] CES, ACSM 09/15/2014 10:48 AM

## 2014-09-15 NOTE — Consult Note (Signed)
ANTICOAGULATION CONSULT NOTE - Follow up [facility] for Coumadin Indication: LVAD  Allergies  Allergen Reactions  . Demerol [Meperidine] Other (See Comments)    Paralysis. Could only move eyes.  . Meperidine Hcl Other (See Comments)    "CAN'T MOVE" CATATONIC PER PT.  [name] Opium Other (See Comments)    "CAN'T MOVE" CATATONIC PER PT.    Patient Measurements: Height: 6' (182.9 cm) Weight: 191 lb 2.2 oz (86.7 kg) IBW/kg (Calculated) : 77.6  Vital Signs: Temp: 98.4 F (36.9 C) (11/11 0751) Temp Source: Oral (11/11 0751) Pulse Rate: 74 (11/11 0751)  Labs:  Recent Labs  09/13/14 1024 09/14/14 0156 09/15/14 0315  HGB 8.6* 7.6* 9.7*  HCT 26.3* 23.5* 30.3*  PLT 317 296 304  LABPROT 27.8* 33.2* 34.4*  INR 2.57* 3.23* 3.37*  CREATININE 2.01* 2.52* 2.35*  TROPONINI <0.30  --   --     Estimated Creatinine Clearance: 30.3 mL/min (by C-G formula based on Cr of 2.35).  Assessment: 72yom s/p LVAD (10/19/13) continues on coumadin. INR was supratherapeutic yesterday and dose was held - INR continues to increase today to 3.37. Hgb improved after transfusion yesterday. Hematuria improving.  Home dose: 2mg  daily except 4mg  Mon/Fri  Goal of Therapy:  INR 2-3 Monitor platelets by anticoagulation protocol: Yes   Plan:  1) Hold coumadin again tonight 2) INR in AM  Deboraha Sprang 09/15/2014,8:20 AM

## 2014-09-15 NOTE — Progress Notes (Addendum)
HeartMate 2 Rounding Note  Subjective:     [name] is a 73 yo male with h/o VT, PAD and severe systolic HF (EF 34-19%) due to mixed ischemic/nonischemic CM he is s/p HM II LVAD implant for destination therapy on October 19, 2013. On 09/01/14 underwent cystoscopy and flexible ureteroscopy with lithotripsy and basket extraction.   VAD implantation was complicated by VT, syncope, A fib/Aflutter and persistent low output felt to be due to RHF. RHC showed low cardiac output but no evidence of RV failure on Milrinone. He was felt to be volume depleted. He was discharged home on 11/18/13 on mirlinone and PRN lasix 20 mg for a weight of 200 pounds or greater. Milrinone has since been weaned off.  On 09/01/14 underwent cystoscopy and flexible ureteroscopy with lithotripsy and basket extraction. Seen in the clinic last week and was doing well. He was still having some hematuria and Hgb was trending down. Volume status was mildly elevated and told to take lasix. Renal function was up and underwent CT of abdomen and pelvis: interval fragmentation of L renal pelvis calculus, one fragment is lodged in mid ureter with mild hydronephrosis prox to calculus.  Presented to ED with SOB (11/9). Stable overnight. Underwent Echo and RV looks good. 24 hr I/O -300 cc. Renal function trending down, Cr 2.3. Received 1 PRBC, Hgb 9.7. MAPs 90-110s. Denies SOB, orthopnea or CP. Renal US, mild L hydronephrosis, bilateral renal cysts.  LVAD INTERROGATION:  HeartMate II LVAD:  Flow 5.1 liters/min, speed 9200, power 5.4, PI 7.0.  No PI events  Objective:    Vital Signs:   Temp:  [97.9 F (36.6 C)-98.6 F (37 C)] 98.2 F (36.8 C) (11/11 0430) Pulse Rate:  [73-91] 75 (11/11 0430) Resp:  [10-21] 14 (11/11 0430) SpO2:  [84 %-100 %] 97 % (11/11 0430) Weight:  [191 lb 2.2 oz (86.7 kg)] 191 lb 2.2 oz (86.7 kg) (11/11 0459) Last BM Date: 09/14/14 Mean arterial Pressure 90-110s  Intake/Output:   Intake/Output Summary  (Last 24 hours) at 09/15/14 0742 Last data filed at 09/15/14 0500  Gross per 24 hour  Intake   1175 ml  Output   1475 ml  Net   -300 ml     Physical Exam: General:  Well appearing. No resp difficulty, lying flat in bed HEENT: normal Neck: supple. JVP 8, Carotids 2+ bilat; no bruits. No lymphadenopathy or thryomegaly appreciated. Cor: Mechanical heart sounds with LVAD hum present. Lungs: clear Abdomen: soft, nontender, nondistended. No hepatosplenomegaly. No bruits or masses. Good bowel sounds. Driveline: C/D/I; securement device intact and driveline incorporated Extremities: no cyanosis, clubbing, rash, trace edema Neuro: alert & orientedx3, cranial nerves grossly intact. moves all 4 extremities w/o difficulty. Affect pleasant  Telemetry: SR 70s  Labs: Basic Metabolic Panel:  Recent Labs Lab 09/09/14 1038 09/13/14 1024 09/14/14 0156 09/15/14 0315  NA 138 139 140 139  K 4.6 4.1 4.8 5.5*  CL 103 100 102 103  CO2 23 25 28 25   GLUCOSE 102* 103* 96 103*  BUN 28* 24* 25* 26*  CREATININE 2.46* 2.01* 2.52* 2.35*  CALCIUM 8.6 8.8 8.5 8.8    Liver Function Tests:  Recent Labs Lab 09/13/14 1024  AST 24  ALT 15  ALKPHOS 106  BILITOT 0.4  PROT 6.7  ALBUMIN 3.6   No results for input(s): LIPASE, AMYLASE in the last 168 hours. No results for input(s): AMMONIA in the last 168 hours.  CBC:  Recent Labs Lab 09/09/14 1038 09/13/14 1024 09/14/14  6045 09/15/14 0315  WBC 4.8 5.3 5.9 6.2  NEUTROABS  --  3.7  --   --   HGB 8.4* 8.6* 7.6* 9.7*  HCT 25.2* 26.3* 23.5* 30.3*  MCV 93.7 96.7 95.1 96.5  PLT 241 317 296 304    INR:  Recent Labs Lab 09/09/14 1038 09/13/14 1024 09/14/14 0156 09/15/14 0315  INR 2.27* 2.57* 3.23* 3.37*    Other results:  EKG:   Imaging: US Renal Port  09/14/2014   CLINICAL DATA:  Renal dysfunction.  EXAM: RENAL/URINARY TRACT ULTRASOUND COMPLETE  COMPARISON:  09/09/2014  FINDINGS: Right Kidney:  Length: 10.8 cm. Two cysts are  identified within the interpolar region of the right kidney. The largest measures 5.7 x 5.7 x 5.4 cm. Echogenicity within normal limits. No mass or hydronephrosis visualized.  Left Kidney:  Length: 11.8 cm. Complicated cyst within the midpole of left kidney containing mural calcifications an internal septation is again noted. This measures 3 x 3.4 x 2.9 cm. Echogenicity within normal limits. Mild left hydronephrosis is again noted.  Bladder:  Appears normal for degree of bladder distention.  IMPRESSION: 1. Mild left hydronephrosis. 2. Bilateral renal cysts 3. Cyst within the left kidney is complicated by mural calcifications and internal septation.   Electronically Signed   By: Kerby Moors M.D.   On: 09/14/2014 15:55   Dg Chest Portable 1 View  09/13/2014   CLINICAL DATA:  Shortness of breath began last night, cough and congestion, former smoker, personal history of hypertension, COPD, coronary artery disease post MI, CHF, ischemic cardiomyopathy, stroke, LEFT ventricular assist device, melanoma  EXAM: PORTABLE CHEST - 1 VIEW  COMPARISON:  Portable exam 1039 hr compared to 11/08/2013  FINDINGS: LEFT subclavian AICD leads project over RIGHT atrium and RIGHT ventricle.  LEFT ventricular assist device again identified.  Enlargement of cardiac silhouette with slight pulmonary vascular congestion.  Atherosclerotic calcification aorta.  Chronic blunting of the LEFT costophrenic angle question tiny chronic effusion versus scarring.  Remaining lungs grossly clear without definite evidence of pulmonary edema.  No pneumothorax.  Bones demineralized.  IMPRESSION: Enlargement of cardiac silhouette post LVAD and AICD.  Slight pulmonary vascular congestion without acute failure or consolidation.   Electronically Signed   By: Lavonia Dana M.D.   On: 09/13/2014 11:00     Medications:     Scheduled Medications: . amiodarone  200 mg Oral BID  . amLODipine  10 mg Oral Daily  . aspirin  81 mg Oral Daily  . atorvastatin   80 mg Oral q1800  . docusate sodium  200 mg Oral Daily  . ferrous gluconate  324 mg Oral TID WC  . hydrALAZINE  25 mg Oral TID  . levothyroxine  125 mcg Oral QAC breakfast  . pantoprazole  40 mg Oral Daily  . ranolazine  500 mg Oral BID  . Warfarin - Pharmacist Dosing Inpatient   Does not apply q1800    Infusions:    PRN Medications: acetaminophen **OR** acetaminophen   Assessment:   1) A/C HF - s/p LVAD 10/2014 2) Afib/Aflutter - s/p DC-CV 06/04/14; in NSR 3) HTN 4) LVAD 5) CKD stage III 6) Hematuria 7) Anemia 8) s/p Renal stone   Plan/Discussion:    Mr. Wyka presented to the ED with increased SOB and weakness (11/9).   Volume status much improved will start lasix 20 mg PO QOD starting today. Renal function trending down will continue to follow. Renal function likely elevated d/t mild hydronephrosis from renal calculi. Will  get urology to come see.   Echo yesterday d/t concerns for RV failure, however RV looked good. Speed could be increased if needed to 9400.   MAPs mildly elevated will increased to hydralazine 37.5 mg TID. No ACE-I or Arlyce Harman with CKD.   Hgb up to 9.7 with 1 PRBC will continue to follow. INR 3.3 will hold coumadin today, pharmacy dosing. Will increase ASA back to 325 mg daily.   Hopefully home tomorrow.  I reviewed the LVAD parameters from today, and compared the results to the patient's prior recorded data.  No programming changes were made.  The LVAD is functioning within specified parameters.  The patient performs LVAD self-test daily.  LVAD interrogation was negative for any significant power changes, alarms or PI events/speed drops.  LVAD equipment check completed and is in good working order.  Back-up equipment present.   LVAD education done on emergency procedures and precautions and reviewed exit site care.  Length of Stay: 2  Rande Brunt NP-C 09/15/2014, 7:42 AM  VAD Team --- VAD ISSUES ONLY--- Pager [phone] (7am - 7am)  Advanced  Heart Failure Team  Pager [phone] (M-F; 7a - 4p)  Please contact [facility] for night-coverage after hours (4p -7a ) and weekends on [email or web address]   Patient seen and examined with Junie Bame, NP. We discussed all aspects of the encounter. I agree with the assessment and plan as stated above.   Looks much better. Volume status improving. Renal function improving slowly. Echo and renal u/s reviewed. Urology to see today regarding hydro. Would not increase hydralazine just yet as would like to preserve renal perfusion as much as possible.    Remains weak. CR to see.   VAD parameters are stable.   Mahlani Berninger,MD 9:15 AM

## 2014-09-16 ENCOUNTER — Other Ambulatory Visit: Payer: Self-pay | Admitting: Urology

## 2014-09-16 DIAGNOSIS — R319 Hematuria, unspecified: Secondary | ICD-10-CM

## 2014-09-16 LAB — BASIC METABOLIC PANEL
Anion gap: 11 (ref 5–15)
BUN: 28 mg/dL — ABNORMAL HIGH (ref 6–23)
CALCIUM: 8.7 mg/dL (ref 8.4–10.5)
CHLORIDE: 103 meq/L (ref 96–112)
CO2: 26 meq/L (ref 19–32)
Creatinine, Ser: 2.4 mg/dL — ABNORMAL HIGH (ref 0.50–1.35)
GFR calc Af Amer: 29 mL/min — ABNORMAL LOW (ref >90)
GFR calc non Af Amer: 25 mL/min — ABNORMAL LOW (ref >90)
GLUCOSE: 99 mg/dL (ref 70–99)
POTASSIUM: 4.9 meq/L (ref 3.7–5.3)
Sodium: 140 mEq/L (ref 137–147)

## 2014-09-16 LAB — PROTIME-INR
INR: 3.15 — AB (ref 0.00–1.49)
Prothrombin Time: 32.6 seconds — ABNORMAL HIGH (ref 11.6–15.2)

## 2014-09-16 LAB — CBC
HEMATOCRIT: 28.3 % — AB (ref 39.0–52.0)
HEMOGLOBIN: 9.1 g/dL — AB (ref 13.0–17.0)
MCH: 30.6 pg (ref 26.0–34.0)
MCHC: 32.2 g/dL (ref 30.0–36.0)
MCV: 95.3 fL (ref 78.0–100.0)
Platelets: 274 10*3/uL (ref 150–400)
RBC: 2.97 MIL/uL — ABNORMAL LOW (ref 4.22–5.81)
RDW: 16.6 % — AB (ref 11.5–15.5)
WBC: 6.2 10*3/uL (ref 4.0–10.5)

## 2014-09-16 LAB — LACTATE DEHYDROGENASE: LDH: 235 U/L (ref 94–250)

## 2014-09-16 NOTE — Progress Notes (Signed)
ANTICOAGULATION CONSULT NOTE - Follow Up Consult  Pharmacy Consult for Coumadin Indication: LVAD  Allergies  Allergen Reactions  . Demerol [Meperidine] Other (See Comments)    Paralysis. Could only move eyes.  . Meperidine Hcl Other (See Comments)    "CAN'T MOVE" CATATONIC PER PT.  [name] Opium Other (See Comments)    "CAN'T MOVE" CATATONIC PER PT.    Patient Measurements: Height: 6' (182.9 cm) Weight: 188 lb 7.9 oz (85.5 kg) IBW/kg (Calculated) : 77.6  Vital Signs: Temp: 98.4 F (36.9 C) (11/12 1100) Temp Source: Oral (11/12 1100) Pulse Rate: 74 (11/12 1100)  Labs:  Recent Labs  09/14/14 0156 09/15/14 0315 09/16/14 0300  HGB 7.6* 9.7* 9.1*  HCT 23.5* 30.3* 28.3*  PLT 296 304 274  LABPROT 33.2* 34.4* 32.6*  INR 3.23* 3.37* 3.15*  CREATININE 2.52* 2.35* 2.40*    Estimated Creatinine Clearance: 29.6 mL/min (by C-G formula based on Cr of 2.4).  Assessment: 72yom s/p LVAD (10/19/13) continues on coumadin. INR was supratherapeutic on admission - doses held 11/10 and 11/11. Today's INR has trended down and is just slightly above goal at 3.1. Patient will have a ureteroscopy on Monday and Drs. Grapey and Bensimhon want the INR to be ~2 so will hold dose again tonight. Hgb decreased but stable.  Home dose: 2mg  daily except 4mg  Mon/Fri  Goal of Therapy:  INR 2-3 Monitor platelets by anticoagulation protocol: Yes   Plan:  1) No coumadin tonight 2) INR in AM  Deboraha Sprang 09/16/2014,2:10 PM

## 2014-09-16 NOTE — Progress Notes (Signed)
CARDIAC REHAB PHASE I   PRE:  Rate/Rhythm: 75 paced  BP:  Supine:   Sitting: 90 map  Standing:    SaO2: 98%RA  MODE:  Ambulation: 770 ft   POST:  Rate/Rhythm: 76paced  BP:  Supine:   Sitting: 96 map  Standing:    SaO2: 97%RA 1000-1024 Pt walked 770 ft with steady gait. Tolerated well. Slightly SOB. I carried back up bag. To recliner after walk. Pt happy to walk. No complaints.   Graylon Good, RN BSN  09/16/2014 10:26 AM

## 2014-09-16 NOTE — Progress Notes (Addendum)
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 On 09/01/14 underwent cystoscopy and flexible ureteroscopy with lithotripsy and basket extraction.   VAD implantation was complicated by VT, syncope, A fib/Aflutter and persistent low output felt to be due to RHF. RHC showed low cardiac output but no evidence of RV failure on Milrinone. He was felt to be volume depleted. He was discharged home on 11/18/13 on mirlinone and PRN lasix 20 mg for a weight of 200 pounds or greater. Milrinone has since been weaned off.  On 09/01/14 underwent cystoscopy and flexible ureteroscopy with lithotripsy and basket extraction. Seen in the clinic last week and was doing well. He was still having some hematuria and Hgb was trending down. Volume status was mildly elevated and told to take lasix. Renal function was up and underwent CT of abdomen and pelvis: interval fragmentation of L renal pelvis calculus, one fragment is lodged in mid ureter with mild hydronephrosis prox to calculus.  Presented to ED with SOB (11/9). Stable overnight. Underwent Echo (11/10) and RV looks good. Weight down another 2 lbs. Renal function up, creatinine 2.4. Still having hematuria and Hgb trending down 9.1. Denies SOB, orthopnea or CP. Renal US, mild L hydronephrosis, bilateral renal cysts.  MAPs 90-100s  LVAD INTERROGATION:  HeartMate II LVAD:  Flow 5.1 liters/min, speed 9200, power 5.5, PI 6.9.  4-8 PI events  Objective:    Vital Signs:   Temp:  [97.7 F (36.5 C)-98.9 F (37.2 C)] 98.2 F (36.8 C) (11/12 0430) Pulse Rate:  [73-82] 75 (11/12 0500) Resp:  [9-24] 11 (11/12 0500) SpO2:  [92 %-100 %] 98 % (11/12 0500) Weight:  [188 lb 7.9 oz (85.5 kg)] 188 lb 7.9 oz (85.5 kg) (11/12 0500) Last BM Date: 09/15/14 Mean arterial Pressure  90-110s  Intake/Output:   Intake/Output Summary (Last 24 hours) at 09/16/14 0737 Last data filed at 09/16/14 0649  Gross per 24 hour  Intake    600 ml  Output   2750 ml  Net  -2150 ml     Physical Exam: General:  Well appearing. No resp difficulty, lying flat in bed HEENT: normal Neck: supple. JVP 7, Carotids 2+ bilat; no bruits. No lymphadenopathy or thryomegaly appreciated. Cor: Mechanical heart sounds with LVAD hum present. Lungs: clear Abdomen: soft, nontender, nondistended. No hepatosplenomegaly. No bruits or masses. Good bowel sounds. Driveline: C/D/I; securement device intact and driveline incorporated Extremities: no cyanosis, clubbing, rash, no edema Neuro: alert & orientedx3, cranial nerves grossly intact. moves all 4 extremities w/o difficulty. Affect pleasant  Telemetry: SR 70s  Labs: Basic Metabolic Panel:  Recent Labs Lab 09/09/14 1038 09/13/14 1024 09/14/14 0156 09/15/14 0315 09/16/14 0300  NA 138 139 140 139 140  K 4.6 4.1 4.8 5.5* 4.9  CL 103 100 102 103 103  CO2 _0 GLUCOSE 102* 103* 96 103* 99  BUN 28* 24* 25* 26* 28*  CREATININE 2.46* 2.01* 2.52* 2.35* 2.40*  CALCIUM 8.6 8.8 8.5 8.8 8.7    Liver Function Tests:  Recent Labs Lab 09/13/14 1024  AST 24  ALT 15  ALKPHOS 106  BILITOT 0.4  PROT 6.7  ALBUMIN 3.6   No results for input(s): LIPASE, AMYLASE in the last 168 hours. No results for input(s): AMMONIA in the  last 168 hours.  CBC:  Recent Labs Lab 09/09/14 1038 09/13/14 1024 09/14/14 0156 09/15/14 0315 09/16/14 0300  WBC 4.8 5.3 5.9 6.2 6.2  NEUTROABS  --  3.7  --   --   --   HGB 8.4* 8.6* 7.6* 9.7* 9.1*  HCT 25.2* 26.3* 23.5* 30.3* 28.3*  MCV 93.7 96.7 95.1 96.5 95.3  PLT 241 317 296 304 274    INR:  Recent Labs Lab 09/09/14 1038 09/13/14 1024 09/14/14 0156 09/15/14 0315 09/16/14 0300  INR 2.27* 2.57* 3.23* 3.37* 3.15*    Other results:  EKG:   Imaging: US Renal Port  09/14/2014   CLINICAL  DATA:  Renal dysfunction.  EXAM: RENAL/URINARY TRACT ULTRASOUND COMPLETE  COMPARISON:  09/09/2014  FINDINGS: Right Kidney:  Length: 10.8 cm. Two cysts are identified within the interpolar region of the right kidney. The largest measures 5.7 x 5.7 x 5.4 cm. Echogenicity within normal limits. No mass or hydronephrosis visualized.  Left Kidney:  Length: 11.8 cm. Complicated cyst within the midpole of left kidney containing mural calcifications an internal septation is again noted. This measures 3 x 3.4 x 2.9 cm. Echogenicity within normal limits. Mild left hydronephrosis is again noted.  Bladder:  Appears normal for degree of bladder distention.  IMPRESSION: 1. Mild left hydronephrosis. 2. Bilateral renal cysts 3. Cyst within the left kidney is complicated by mural calcifications and internal septation.   Electronically Signed   By: Kerby Moors M.D.   On: 09/14/2014 15:55     Medications:     Scheduled Medications: . amiodarone  200 mg Oral BID  . amLODipine  10 mg Oral Daily  . aspirin EC  325 mg Oral Daily  . atorvastatin  80 mg Oral q1800  . docusate sodium  200 mg Oral Daily  . ferrous gluconate  324 mg Oral TID WC  . furosemide  20 mg Oral QODAY  . hydrALAZINE  25 mg Oral TID  . levothyroxine  125 mcg Oral QAC breakfast  . pantoprazole  40 mg Oral Daily  . ranolazine  500 mg Oral BID  . Warfarin - Pharmacist Dosing Inpatient   Does not apply q1800    Infusions:    PRN Medications: acetaminophen **OR** acetaminophen   Assessment:   1) A/C HF - s/p LVAD 10/2014 2) Afib/Aflutter - s/p DC-CV 06/04/14; in NSR 3) HTN 4) LVAD 5) CKD stage III 6) Hematuria 7) Acute on chronic blood loss anemia 8) s/p Renal stone   Plan/Discussion:    [name] presented to the ED with increased SOB and weakness (11/9).   Volume status stable, weight down another 2 lbs on no diuretics will continue to hold. Renal function trending up and possibly related to renal  calculi/hydronephrosis  Still having hematuria and Hgb trending down. Patient met with Dr. Risa Grill yesterday and he prefers to move forward and go with procedure (lithortripsy) again to see if the renal stones can be broken up and passed. Trying to coordinate all the ancillary staff such as anesthesia, schedule and carelink. Patient would need to transfer to [facility] for procedure and [facility] VAD Coordinator would need to be with him. Will hold Coumadin today and let it trend down to less than 2.0 and then start heparin for bridge so during procedure can see more. Continue to follow Hgb and may need another transfusion.   Echo (11/10) d/t concerns for RV failure, however RV looked good. Speed could be increased if needed to 9400.  MAPs better controlled 90-100s, will continue current medications. No ACE-I or Arlyce Harman with CKD.   Continue to ambulate with CR.   I reviewed the LVAD parameters from today, and compared the results to the patient's prior recorded data.  No programming changes were made.  The LVAD is functioning within specified parameters.  The patient performs LVAD self-test daily.  LVAD interrogation was negative for any significant power changes, alarms or PI events/speed drops.  LVAD equipment check completed and is in good working order.  Back-up equipment present.   LVAD education done on emergency procedures and precautions and reviewed exit site care.  Length of Stay: 3  Rande Brunt NP-C 09/16/2014, 7:37 AM  VAD Team --- VAD ISSUES ONLY--- Pager [phone] (7am - 7am)  Advanced Heart Failure Team  Pager [phone] (M-F; 7a - 4p)  Please contact [facility] for night-coverage after hours (4p -7a ) and weekends on [email or web address]   Patient seen and examined with Junie Bame, NP. We discussed all aspects of the encounter. I agree with the assessment and plan as stated above.   He is improving. Volume status looks ok. Creatinine up slightly. Lasix now every other day. Will  hold and just use prn. Plan is for repeat ureteroscopy on Monday with Dr. Risa Grill. We are. Ok to remain on coumadin for procedure but would like to have INR close to 2.0.   VAD parameters look good.  Can go to SDU.  Daniel Bensimhon,MD 11:03 AM

## 2014-09-17 LAB — BASIC METABOLIC PANEL
ANION GAP: 12 (ref 5–15)
BUN: 29 mg/dL — ABNORMAL HIGH (ref 6–23)
CALCIUM: 8.9 mg/dL (ref 8.4–10.5)
CHLORIDE: 103 meq/L (ref 96–112)
CO2: 25 mEq/L (ref 19–32)
Creatinine, Ser: 2.39 mg/dL — ABNORMAL HIGH (ref 0.50–1.35)
GFR calc Af Amer: 29 mL/min — ABNORMAL LOW (ref >90)
GFR calc non Af Amer: 25 mL/min — ABNORMAL LOW (ref >90)
Glucose, Bld: 100 mg/dL — ABNORMAL HIGH (ref 70–99)
Potassium: 5.2 mEq/L (ref 3.7–5.3)
Sodium: 140 mEq/L (ref 137–147)

## 2014-09-17 LAB — CBC
HCT: 28.6 % — ABNORMAL LOW (ref 39.0–52.0)
Hemoglobin: 9.3 g/dL — ABNORMAL LOW (ref 13.0–17.0)
MCH: 31.6 pg (ref 26.0–34.0)
MCHC: 32.5 g/dL (ref 30.0–36.0)
MCV: 97.3 fL (ref 78.0–100.0)
PLATELETS: 273 10*3/uL (ref 150–400)
RBC: 2.94 MIL/uL — ABNORMAL LOW (ref 4.22–5.81)
RDW: 16.3 % — ABNORMAL HIGH (ref 11.5–15.5)
WBC: 6.1 10*3/uL (ref 4.0–10.5)

## 2014-09-17 LAB — PROTIME-INR
INR: 3 — ABNORMAL HIGH (ref 0.00–1.49)
PROTHROMBIN TIME: 31.4 s — AB (ref 11.6–15.2)

## 2014-09-17 LAB — LACTATE DEHYDROGENASE: LDH: 249 U/L (ref 94–250)

## 2014-09-17 NOTE — Progress Notes (Signed)
ANTICOAGULATION CONSULT NOTE - Follow Up Consult  Pharmacy Consult for Coumadin Indication: LVAD  Allergies  Allergen Reactions  . Demerol [Meperidine] Other (See Comments)    Paralysis. Could only move eyes.  . Meperidine Hcl Other (See Comments)    "CAN'T MOVE" CATATONIC PER PT.  [name] Opium Other (See Comments)    "CAN'T MOVE" CATATONIC PER PT.    Patient Measurements: Height: 6' (182.9 cm) Weight: 190 lb 0.6 oz (86.2 kg) IBW/kg (Calculated) : 77.6  Vital Signs: Temp: 97.7 F (36.5 C) (11/13 0803) Temp Source: Oral (11/13 0803)  Labs:  Recent Labs  09/15/14 0315 09/16/14 0300 09/17/14 0215  HGB 9.7* 9.1* 9.3*  HCT 30.3* 28.3* 28.6*  PLT 304 274 273  LABPROT 34.4* 32.6* 31.4*  INR 3.37* 3.15* 3.00*  CREATININE 2.35* 2.40* 2.39*    Estimated Creatinine Clearance: 29.8 mL/min (by C-G formula based on Cr of 2.39).  Assessment: 72yom s/p LVAD (10/19/13) continues on coumadin. INR was supratherapeutic on admission - doses held 11/10 and 11/11. Today's INR has trended down and is at goal at 3. Patient will have a ureteroscopy on Monday and Drs. Grapey and Bensimhon want the INR to be ~2 so will hold dose again tonight. Hgb stable.  Home dose: 2mg  daily except 4mg  Mon/Fri  Goal of Therapy:  INR 2-3 Monitor platelets by anticoagulation protocol: Yes   Plan:  1) No coumadin tonight 2) INR in AM  Erin Hearing PharmD., BCPS Clinical Pharmacist Pager [phone] 09/17/2014 10:46 AM

## 2014-09-17 NOTE — Progress Notes (Signed)
Received notification from LVAD coordinator that pt surgery is scheduled at [facility] on Monday.  Pt needs to be in holding area by 12:30 on 09/20/2014.  [facility] to transport and will need to be notified Monday morning.

## 2014-09-17 NOTE — Progress Notes (Signed)
HeartMate 2 Rounding Note  Subjective:     [name] is a 73 yo male with h/o VT, PAD and severe systolic HF (EF 25-36%) due to mixed ischemic/nonischemic CM he is s/p HM II LVAD implant for destination therapy on October 19, 2013. On 09/01/14 underwent cystoscopy and flexible ureteroscopy with lithotripsy and basket extraction.   VAD implantation was complicated by VT, syncope, A fib/Aflutter and persistent low output felt to be due to RHF. RHC showed low cardiac output but no evidence of RV failure on Milrinone. He was felt to be volume depleted. He was discharged home on 11/18/13 on mirlinone and PRN lasix 20 mg for a weight of 200 pounds or greater. Milrinone has since been weaned off.  On 09/01/14 underwent cystoscopy and flexible ureteroscopy with lithotripsy and basket extraction. Seen in the clinic last week and was doing well. He was still having some hematuria and Hgb was trending down. Volume status was mildly elevated and told to take lasix. Renal function was up and underwent CT of abdomen and pelvis: interval fragmentation of L renal pelvis calculus, one fragment is lodged in mid ureter with mild hydronephrosis prox to calculus.  Presented to ED with SOB (11/9). Stable overnight. Underwent Echo (11/10) and RV looks good. Renal US, mild L hydronephrosis, bilateral renal cysts.  Did well overnight. Still mild DOE. Weight stable. Creatinine stable 2.4. Still having hematuria and Hgb stable.  MAPs 90-100s  LVAD INTERROGATION:  HeartMate II LVAD:  Flow 5.6 liters/min, speed 9200, power 6.0, PI 6.1.  Occasional PI events  Objective:    Vital Signs:   Temp:  [98 F (36.7 C)-98.4 F (36.9 C)] 98.2 F (36.8 C) (11/13 0000) Pulse Rate:  [74-75] 75 (11/12 1600) SpO2:  [95 %-98 %] 95 % (11/12 2000) Last BM Date: 09/15/14 Mean arterial Pressure 90-110s  Intake/Output:   Intake/Output Summary (Last 24 hours) at 09/17/14 0609 Last data filed at 09/16/14 1300  Gross per 24 hour   Intake    100 ml  Output   1250 ml  Net  -1150 ml     Physical Exam: General:  Well appearing. No resp difficulty, lying flat in bed HEENT: normal Neck: supple. JVP 7, Carotids 2+ bilat; no bruits. No lymphadenopathy or thryomegaly appreciated. Cor: Mechanical heart sounds with LVAD hum present. Lungs: clear Abdomen: soft, nontender, nondistended. No hepatosplenomegaly. No bruits or masses. Good bowel sounds. Driveline: C/D/I; securement device intact and driveline incorporated Extremities: no cyanosis, clubbing, rash, no edema Neuro: alert & orientedx3, cranial nerves grossly intact. moves all 4 extremities w/o difficulty. Affect pleasant  Telemetry: a-paced 70s  Labs: Basic Metabolic Panel:  Recent Labs Lab 09/13/14 1024 09/14/14 0156 09/15/14 0315 09/16/14 0300 09/17/14 0215  NA 139 140 139 140 140  K 4.1 4.8 5.5* 4.9 5.2  CL 100 102 103 103 103  CO2 25 28 25 26 25   GLUCOSE 103* 96 103* 99 100*  BUN 24* 25* 26* 28* 29*  CREATININE 2.01* 2.52* 2.35* 2.40* 2.39*  CALCIUM 8.8 8.5 8.8 8.7 8.9    Liver Function Tests:  Recent Labs Lab 09/13/14 1024  AST 24  ALT 15  ALKPHOS 106  BILITOT 0.4  PROT 6.7  ALBUMIN 3.6   No results for input(s): LIPASE, AMYLASE in the last 168 hours. No results for input(s): AMMONIA in the last 168 hours.  CBC:  Recent Labs Lab 09/13/14 1024 09/14/14 0156 09/15/14 0315 09/16/14 0300 09/17/14 0215  WBC 5.3 5.9 6.2 6.2 6.1  NEUTROABS 3.7  --   --   --   --   HGB 8.6* 7.6* 9.7* 9.1* 9.3*  HCT 26.3* 23.5* 30.3* 28.3* 28.6*  MCV 96.7 95.1 96.5 95.3 97.3  PLT 317 296 304 274 273    INR:  Recent Labs Lab 09/13/14 1024 09/14/14 0156 09/15/14 0315 09/16/14 0300 09/17/14 0215  INR 2.57* 3.23* 3.37* 3.15* 3.00*    Other results:    Imaging: No results found.   Medications:     Scheduled Medications: . amiodarone  200 mg Oral BID  . amLODipine  10 mg Oral Daily  . aspirin EC  325 mg Oral Daily  .  atorvastatin  80 mg Oral q1800  . docusate sodium  200 mg Oral Daily  . ferrous gluconate  324 mg Oral TID WC  . hydrALAZINE  25 mg Oral TID  . levothyroxine  125 mcg Oral QAC breakfast  . pantoprazole  40 mg Oral Daily  . ranolazine  500 mg Oral BID  . Warfarin - Pharmacist Dosing Inpatient   Does not apply q1800    Infusions:    PRN Medications: acetaminophen **OR** acetaminophen   Assessment:   1) A/C HF - s/p LVAD 10/2014 2) Afib/Aflutter - s/p DC-CV 06/04/14; in NSR 3) HTN 4) LVAD 5) Acute on chronic renal failure stage III 6) Hematuria 7) Acute on chronic blood loss anemia 8) s/p Renal stone   Plan/Discussion:    [name] presented to the ED with increased SOB and weakness (11/9).   Volume status stable. Creatinine up to 2.4 but stable. Worsening renal function possibly related to renal calculi/hydronephrosis. Plan for stone fragment extraction Monday or Wednesday. Ok to continue coumadin. Use lasix prn.   Echo (11/10) d/t concerns for RV failure, however RV looked good. Speed could be increased if needed to 9400.   MAPs better controlled 90-100s, will continue current medications. No ACE-I or Arlyce Harman with CKD.   Continue to ambulate with CR.   I reviewed the LVAD parameters from today, and compared the results to the patient's prior recorded data.  No programming changes were made.  The LVAD is functioning within specified parameters.  The patient performs LVAD self-test daily.  LVAD interrogation was negative for any significant power changes, alarms or PI events/speed drops.  LVAD equipment check completed and is in good working order.  Back-up equipment present.   LVAD education done on emergency procedures and precautions and reviewed exit site care.  Length of Stay: [place] NP-C 09/17/2014, 6:09 AM  VAD Team --- VAD ISSUES ONLY--- Pager [phone] (7am - 7am)  Advanced Heart Failure Team  Pager [phone] (M-F; 7a - 4p)  Please contact [facility] for night-coverage after hours (4p -7a ) and weekends on [email or web address]

## 2014-09-17 NOTE — Progress Notes (Signed)
1340 Pt walking independently with steady gait. We are signing off. Please reorder [place] if needed after procedure to walk. Thanks. Graylon Good RN BSN 09/17/2014 1:43 PM

## 2014-09-18 LAB — CBC
HCT: 27.2 % — ABNORMAL LOW (ref 39.0–52.0)
Hemoglobin: 8.9 g/dL — ABNORMAL LOW (ref 13.0–17.0)
MCH: 32.1 pg (ref 26.0–34.0)
MCHC: 32.7 g/dL (ref 30.0–36.0)
MCV: 98.2 fL (ref 78.0–100.0)
PLATELETS: 257 10*3/uL (ref 150–400)
RBC: 2.77 MIL/uL — ABNORMAL LOW (ref 4.22–5.81)
RDW: 16.4 % — ABNORMAL HIGH (ref 11.5–15.5)
WBC: 6.1 10*3/uL (ref 4.0–10.5)

## 2014-09-18 LAB — BASIC METABOLIC PANEL
ANION GAP: 12 (ref 5–15)
BUN: 31 mg/dL — AB (ref 6–23)
CALCIUM: 8.8 mg/dL (ref 8.4–10.5)
CO2: 24 mEq/L (ref 19–32)
CREATININE: 2.61 mg/dL — AB (ref 0.50–1.35)
Chloride: 102 mEq/L (ref 96–112)
GFR calc Af Amer: 26 mL/min — ABNORMAL LOW (ref >90)
GFR, EST NON AFRICAN AMERICAN: 23 mL/min — AB (ref >90)
Glucose, Bld: 105 mg/dL — ABNORMAL HIGH (ref 70–99)
Potassium: 4.7 mEq/L (ref 3.7–5.3)
Sodium: 138 mEq/L (ref 137–147)

## 2014-09-18 LAB — LACTATE DEHYDROGENASE: LDH: 252 U/L — ABNORMAL HIGH (ref 94–250)

## 2014-09-18 LAB — PROTIME-INR
INR: 2.48 — ABNORMAL HIGH (ref 0.00–1.49)
PROTHROMBIN TIME: 27.1 s — AB (ref 11.6–15.2)

## 2014-09-18 MED ORDER — WARFARIN SODIUM 1 MG PO TABS
1.0000 mg | ORAL_TABLET | Freq: Once | ORAL | Status: AC
  Administered 2014-09-18: 1 mg via ORAL
  Filled 2014-09-18: qty 1

## 2014-09-18 NOTE — H&P (Addendum)
Primary Physician: Dr. Karolee Stamps ([place]) Primary Cardiologist:  Dr. Haroldine Laws  Reason for Admission: Shortness of breath  HPI:    [name] is a 73 yo male with h/o VT, PAD and severe systolic HF (EF 26-71%) due to mixed ischemic/nonischemic CM he is s/p HM II LVAD implant for destination therapy on October 19, 2013. On 09/01/14 underwent cystoscopy and flexible ureteroscopy with lithotripsy and basket extraction.   VAD implantation was complicated by VT, syncope, A fib/Aflutter and persistent low output felt to be due to RHF. RHC showed low cardiac output but no evidence of RV failure on Milrinone. He was felt to be volume depleted. He was discharged home on 11/18/13 on mirlinone and PRN lasix 20 mg for a weight of 200 pounds or greater. Milrinone has since been weaned off  On 09/01/14 underwent cystoscopy and flexible ureteroscopy with lithotripsy and basket extraction. Seen in the clinic last week and was doing well. He was still having some hematuria and Hgb was trending down. Volume status was mildly elevated and told to take lasix. Renal function was up and underwent CT of abdomen and pelvis: interval fragmentation of L renal pelvis calculus, one fragment is lodged in mid ureter with mild hydronephrosis prox to calculus.  Presented today to ED with increased SOB and LE edema. Took lasix Friday. Still having hematuria. Denies CP, fevers or chills.    LVAD INTERROGATION:  HeartMate II LVAD:  Flow 5.1 liters/min, speed 9200, power 5.6, PI 7.6; 6 PI events     Review of Systems: [y] = yes, [ ]  = no   General: Weight gain [Y ]; Weight loss [ ] ; Anorexia [ ] ; Fatigue [ ] ; Fever [N ]; Chills Aqua.Slicker ]; Weakness [ ]   Cardiac: Chest pain/pressure [ ] ; Resting SOB [ ] ; Exertional SOB [ Y]; Orthopnea [ ] ; Pedal Edema [ Y]; Palpitations Aqua.Slicker ]; Syncope [ ] ; Presyncope [ ] ; Paroxysmal nocturnal dyspnea[ ]   Pulmonary: Cough [ ] ; Wheezing[ ] ; Hemoptysis[ ] ; Sputum [ ] ; Snoring [ ]   GI:  Vomiting[ ] ; Dysphagia[ ] ; Melena[ ] ; Hematochezia [ ] ; Heartburn[ ] ; Abdominal pain [ ] ; Constipation [ ] ; Diarrhea Aqua.Slicker ]; BRBPR [ ]   GU: Hematuria[ ] ; Dysuria [ ] ; Nocturia[ ]   Vascular: Pain in legs with walking [Y ]; Pain in feet with lying flat [ ] ; Non-healing sores [ ] ; Stroke [ ] ; TIA [ ] ; Slurred speech [ ] ;  Neuro: Headaches[ ] ; Vertigo[ ] ; Seizures[ ] ; Paresthesias[ ] ;Blurred vision [ ] ; Diplopia [ ] ; Vision changes [ ]   Ortho/Skin: Arthritis [ ] ; Joint pain [Y ]; Muscle pain [ ] ; Joint swelling [ ] ; Back Pain [ ] ; Rash [ ]   Psych: Depression[ ] ; Anxiety[ ]   Heme: Bleeding problems [ ] ; Clotting disorders [ ] ; Anemia [ ]   Endocrine: Diabetes [ ] ; Thyroid dysfunction[ ]   Home Medications Prior to Admission medications   Medication Sig Start Date End Date Taking? Authorizing Provider  amiodarone (PACERONE) 200 MG tablet Take 200 mg by mouth 2 (two) times daily. 06/03/14   Jolaine Artist, MD  amLODipine (NORVASC) 10 MG tablet Take 10 mg by mouth every evening.    Historical Provider, MD  aspirin 81 MG tablet Take 81 mg by mouth every morning.     Historical Provider, MD  atorvastatin (LIPITOR) 80 MG tablet Take 80 mg by mouth every morning.    Historical Provider, MD  CALCIUM-MAGNESIUM-ZINC PO Take 3 tablets by mouth every evening. 1000 mg calcium, 400 mg magnesium, 25  mg zinc    Historical Provider, MD  cephALEXin (KEFLEX) 250 MG capsule Take 1 capsule (250 mg total) by mouth daily. 09/09/14   Jolaine Artist, MD  docusate sodium (COLACE) 100 MG capsule Take 200 mg by mouth every morning.    Historical Provider, MD  ferrous gluconate (FERGON) 324 MG tablet Take 1 tablet (324 mg total) by mouth 3 (three) times daily with meals. 06/07/14   Jolaine Artist, MD  furosemide (LASIX) 20 MG tablet Take 20 mg by mouth 2 (two) times a week.     Historical Provider, MD  hydrALAZINE (APRESOLINE) 25 MG tablet Take 0.5 tablets (12.5 mg total) by mouth 3 (three) times daily. 07/29/14   Rande Brunt, NP  HYDROcodone-acetaminophen (NORCO/VICODIN) 5-325 MG per tablet Take 1-2 tablets by mouth every 6 (six) hours as needed. 08/09/14   Bernestine Amass, MD  levothyroxine (SYNTHROID) 125 MCG tablet Take 1 tablet (125 mcg total) by mouth daily. 04/01/14   Deboraha Sprang, MD  Multiple Vitamin (MULTIVITAMIN) tablet Take 1 tablet by mouth at bedtime.     Historical Provider, MD  Multiple Vitamins-Minerals (ICAPS PO) Take 1 tablet by mouth 2 (two) times daily.    Historical Provider, MD  nitroGLYCERIN (NITROSTAT) 0.4 MG SL tablet Place 0.4 mg under the tongue every 5 (five) minutes as needed for chest pain. For chest pain    Historical Provider, MD  pantoprazole (PROTONIX) 40 MG tablet TAKE 1 TABLET (40 MG TOTAL) BY MOUTH DAILY. 09/01/14   Jolaine Artist, MD  ranolazine (RANEXA) 500 MG 12 hr tablet Take 500 mg by mouth 2 (two) times daily.    Historical Provider, MD  warfarin (COUMADIN) 2 MG tablet Take 2-4 mg by mouth at bedtime. Take 2 tablets (4 mg) on Monday,  and Friday, take 1 tablet (2 mg) on Sunday, Tuesday,Wednesday, Thursday and Saturday    Historical Provider, MD    Past Medical History: Past Medical History  Diagnosis Date  . Bradycardia   . Ventricular tachycardia     s/p AICD;   h/o ICD shock;  Amiodarone Rx. S/P ablation in 2012  . 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 HTN (hypertension)   . HLD (hyperlipidemia)   . Cytopenia   . Hypothyroidism   . Renal insufficiency   . Claudication   . Chronic systolic heart failure   . Ischemic cardiomyopathy     echo 7/12:  EF 25-30%, mild AI, mod MR, mod LAE  . TIA (transient ischemic attack) 2001  . CVD (cerebrovascular disease)     left CEA 2008  . Stroke   . Anemia   . Inappropriate therapy from implantable cardioverter-defibrillator 04/02/2013    ATP for sinus tach  SVT wavelet identified  SVT but was no  passive   . Myocardial infarction 1992  . Anginal pain   . Automatic implantable cardioverter-defibrillator in situ     Medtronic Evera device serial number [id number]    . Arthritis     "a little in my knees" (05/25/2013)  . Melanoma of ear 2013    "near left ear" (05/25/2013)  . 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 Dysrhythmia     HX A FIB  . History of nonmelanoma skin cancer  L EAR  . Kidney stone   . History of transfusion   . Sleep apnea     denies wearing mask (05/25/2013)  . CAD (coronary artery disease)     s/p MI 1982;  s/p DES to CFX 2011;  s/p NSTEMI 4/12 in setting of VTach;  cath 7/12:  LM ok, LAD mid 30-40%, Dx's with 40%, mCFX stent patent, prox to mid RCA occluded with R to R and L to R collats.  Medical Tx was continued  . CHF (congestive heart failure)     HAS VENTRICULAR PUMP    Past Surgical History: Past Surgical History  Procedure Laterality Date  . Femoral-popliteal bypass graft Right 2011  . Cardiac defibrillator placement  2003; 2005; 05/25/2013    2014: Medtronic Evera device serial number [id number]  . Revascularization / in-situ graft leg    . Renal artery bypass Bilateral 2003  . Back surgery    . Lumbar disc surgery  1974; 2000's  . Knee arthroscopy Bilateral 1990's  . Elbow arthroscopy Right 1990's  . Cholecystectomy  12/15/?2010  . Lipoma excision Left 2013    "near ear" (05/25/2013)  . Heel spur surgery Right 1990's  . Cataract extraction w/ intraocular lens  implant, bilateral Bilateral 2000  . Carotid endarterectomy Left ~ 2007  . Doppler echocardiography  2011  . Tonsillectomy  1946  . Femoral-popliteal bypass graft Left 05/2002    Archie Endo 05/06/2002 (05/25/2013)  . Tumor excision Left 1960's    "fatty tumor" (05/25/2013)  . Cardiac electrophysiology study and ablation  2012  . Coronary angioplasty  1992  . Cardiac catheterization      "several" (05/25/2013)  . Coronary angioplasty with stent placement      "last one  was 11/2012  (05/25/2013)  . Cardioversion N/A 10/15/2013    Procedure: CARDIOVERSION;  Surgeon: Jolaine Artist, MD;  Location: [facility];  Service: Cardiovascular;  Laterality: N/A;  . Insertion of implantable left ventricular assist device N/A 10/19/2013    Procedure: INSERTION OF IMPLANTABLE LEFT VENTRICULAR ASSIST DEVICE;  Surgeon: Gaye Pollack, MD;  Location: [facility];  Service: Open Heart Surgery;  Laterality: N/A;  CIRC ARREST  NITRIC OXIDE  MEDTRONIC ICD  . Intraoperative transesophageal echocardiogram N/A 10/19/2013    Procedure: INTRAOPERATIVE TRANSESOPHAGEAL ECHOCARDIOGRAM;  Surgeon: Gaye Pollack, MD;  Location[facility] OR;  Service: Open Heart Surgery;  Laterality: N/A;  . Tee without cardioversion N/A 11/04/2013    Procedure: TRANSESOPHAGEAL ECHOCARDIOGRAM (TEE);  Surgeon: Larey Dresser, MD;  Location: [facility];  Service: Cardiovascular;  Laterality: N/A;  . Cardioversion N/A 11/04/2013    Procedure: CARDIOVERSION;  Surgeon: Larey Dresser, MD;  Location: [facility];  Service: Cardiovascular;  Laterality: N/A;  . Cardioversion N/A 06/04/2014    Procedure: CARDIOVERSION;  Surgeon: Jolaine Artist, MD;  Location: [facility] ENDOSCOPY;  Service: Cardiovascular;  Laterality: N/A;  . Cystoscopy with retrograde pyelogram, ureteroscopy and stent placement Left 08/09/2014    Procedure: [place], URETEROSCOPY AND STENT PLACEMENT;  Surgeon: Bernestine Amass, MD;  Location: [facility] ORS;  Service: Urology;  Laterality: Left;  . Cystoscopy with retrograde pyelogram, ureteroscopy and stent placement Left 09/01/2014    Procedure: CYSTOSCOPY WITH URETEROSCOPY AND STENT REMOVAL;  Surgeon: Bernestine Amass, MD;  Location: [facility] OR[facility];  Service: Urology;  Laterality: Left;  . Holmium laser application Left 24/07/7352    Procedure: HOLMIUM LASER APPLICATION;  Surgeon: Bernestine Amass, MD;  Location: [facility];  Service: Urology;  Laterality:  Left;    Family History: Family History  Problem  Relation Age of Onset  . Coronary artery disease    . Heart attack Mother   . Coronary artery disease Mother   . Cancer Mother   . Heart disease Mother   . Hyperlipidemia Mother   . Hypertension Mother   . Coronary artery disease Father   . Stroke Father   . Cancer Father   . Heart disease Father     before age 73  . Hyperlipidemia Father   . Hypertension Father   . Heart attack Father     Social History: History   Social History  . Marital Status: Divorced    Spouse Name: N/A    Number of Children: 2  . Years of Education: N/A   Occupational History  . retired     [employer]  . Retired     Security [employer]  . 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 Smoking status: Former Smoker -- 0.50 packs/day for 37 years    Types: Cigarettes    Quit date: 11/05/2001  . Smokeless tobacco: Never Used  . Alcohol Use: 0.0 oz/week     Comment: 05/25/2013 "mixed drink couple times/month"  . Drug Use: No  . Sexual Activity: No   Other Topics Concern  . None   Social History Narrative   Lives with sig other.    Allergies:  Allergies  Allergen Reactions  . Demerol [Meperidine] Other (See Comments)    Paralysis. Could only move eyes.  . Meperidine Hcl Other (See Comments)    "CAN'T MOVE" CATATONIC PER PT.  [name] Opium Other (See Comments)    "CAN'T MOVE" CATATONIC PER PT.    Objective:    Vital Signs:   Temp:  [97.9 F (36.6 C)-98.5 F (36.9 C)] 97.9 F (36.6 C) (11/14 1208) Pulse Rate:  [74-75] 75 (11/14 1208) Resp:  [20] 20 (11/13 1600) SpO2:  [97 %-98 %] 98 % (11/14 1208) Weight:  [85.095 kg (187 lb 9.6 oz)] 85.095 kg (187 lb 9.6 oz) (11/14 0300) Last BM Date: 09/17/14 Filed Weights   09/16/14 0500 09/17/14 0600 09/18/14 0300  Weight: 85.5 kg (188 lb 7.9 oz) 86.2 kg (190 lb 0.6 oz) 85.095 kg (187 lb 9.6 oz)    Mean arterial Pressure   Physical Exam: GENERAL: Well appearing, male who presents to clinic today in no acute  distress. Wife present HEENT: normal  NECK: Supple, JVP 8-9 . 2+ bilaterally, no bruits. No lymphadenopathy or thyromegaly appreciated.  CARDIAC: Mechanical heart sounds with LVAD hum present.  LUNGS: Clear to auscultation bilaterally.  ABDOMEN: Soft, round, nontender, positive bowel sounds x4.  LVAD exit site: well-healed and incorporated. Dressing dry and intact. No erythema or drainage. Stabilization device present and accurately applied. Driveline dressing is being changed weekly per sterile technique. EXTREMITIES: Warm and dry, no cyanosis, clubbing, bilateral 2 + bilateral edema NEUROLOGIC: Alert and oriented x 4. Gait steady. No aphasia. No dysarthria. Affect pleasant  Telemetry: SR 70s 1st degree AV block  Labs: Basic Metabolic Panel:  Recent Labs Lab 09/14/14 0156 09/15/14 0315 09/16/14 0300 09/17/14 0215 09/18/14 0345  NA 140 139 140 140 138  K 4.8 5.5* 4.9 5.2 4.7  CL 102 103 103 103 102  CO2 28 25 26 25 24   GLUCOSE 96 103* 99 100* 105*  BUN 25* 26* 28* 29* 31*  CREATININE 2.52* 2.35* 2.40* 2.39* 2.61*  CALCIUM 8.5 8.8 8.7 8.9 8.8    Liver Function Tests:  Recent Labs Lab 09/13/14 1024  AST 24  ALT 15  ALKPHOS 106  BILITOT 0.4  PROT 6.7  ALBUMIN 3.6   No results for input(s): LIPASE, AMYLASE in the last 168 hours. No results for input(s): AMMONIA in the last 168 hours.  CBC:  Recent Labs Lab 09/13/14 1024 09/14/14 0156 09/15/14 0315 09/16/14 0300 09/17/14 0215 09/18/14 0345  WBC 5.3 5.9 6.2 6.2 6.1 6.1  NEUTROABS 3.7  --   --   --   --   --   HGB 8.6* 7.6* 9.7* 9.1* 9.3* 8.9*  HCT 26.3* 23.5* 30.3* 28.3* 28.6* 27.2*  MCV 96.7 95.1 96.5 95.3 97.3 98.2  PLT 317 296 304 274 273 257    Cardiac Enzymes:  Recent Labs Lab 09/13/14 1024  TROPONINI <0.30    BNP: BNP (last 3 results)  Recent Labs  07/29/14 0957 09/09/14 1038 09/13/14 1024  PROBNP 1468.0* 5067.0* 5152.0*    CBG: No results for input(s):  GLUCAP in the last 168 hours.  Coagulation Studies:  Recent Labs  09/16/14 0300 09/17/14 0215 09/18/14 0345  LABPROT 32.6* 31.4* 27.1*  INR 3.15* 3.00* 2.48*    Other results: EKG: atrial pacing 75  Imaging: No results found.      Assessment:   1) A/C HF - s/p LVAD 10/2014 2) Afib/Aflutter - s/p DC-CV 06/04/14; in NSR 3) HTN 4) LVAD 5) CKD stage III 6) Hematuria 7) Anemia 8) s/p Renal stone lithotripsy  Plan/Discussion:    Mr. Paluch was seen in the the HF clinic and at that time was doing well. His Hgb was trending down, however he did not have any symptoms. His volume was up slightly along with renal function and took lasix over the weekend.    Presented today with increased SOB. Concern for anemia since he is still having hematuria. Will check CBC and transfuse if Hgb < 8.5 with symptoms.   Volume status elevated, will give 40 mg IV lasix if renal function stable.. Had increased renal function last week and thought to be d/t renal calculi. CT showed interval fragmentation of L renal pelvis calculus, one fragment is lodged in mid ureter with mild hydronephrosis prox to calculus. Will consult urology while here had f/u appt this week. Check UA and Cx.   Pending INR. Pharmacy dosing. Will continue ASA 81 mg daily currently with hematuria but would eventually like to get back to 325 mg daily.   MAPs were elevated on admission, however had not taken medications will continue to follow. No ACE-I or Arlyce Harman with CKD.  I reviewed the LVAD parameters from today, and compared the results to the patient's prior recorded data.  No programming changes were made.  The LVAD is functioning within specified parameters.  The patient performs LVAD self-test daily.  LVAD interrogation was negative for any significant power changes, alarms or PI events/speed drops.  LVAD equipment check completed and is in good working order.  Back-up equipment present.   LVAD education done on emergency  procedures and precautions and reviewed exit site care.  Length of Stay: [place] 09/18/2014, 12:35 PM  VAD Team Pager [phone] (7am - 7am) +++VAD ISSUES ONLY+++   Advanced Heart Failure Team Pager [phone] (M-F; [place])  Please contact [facility] for night-coverage after hours (4p -7a ) and weekends on [email or web address] for all non- LVAD Issues   Patient seen and examined with Deatra Canter  Boyce Medici, NP. We discussed all aspects of the encounter. I agree with the assessment and plan as stated above.   Hgb stable. Neck veins are up but responding to lasix. My main concern is RV failure. Will continue lasix overnight. Plan echo in am +/- RHC. Will keep NPO.  Benay Spice 12:35 PM

## 2014-09-18 NOTE — Progress Notes (Signed)
ANTICOAGULATION CONSULT NOTE - Follow Up Consult  Pharmacy Consult for Coumadin Indication: LVAD  Allergies  Allergen Reactions  . Demerol [Meperidine] Other (See Comments)    Paralysis. Could only move eyes.  . Meperidine Hcl Other (See Comments)    "CAN'T MOVE" CATATONIC PER PT.  [name] Opium Other (See Comments)    "CAN'T MOVE" CATATONIC PER PT.    Patient Measurements: Height: 6' (182.9 cm) Weight: 187 lb 9.6 oz (85.095 kg) IBW/kg (Calculated) : 77.6  Vital Signs: Temp: 98.4 F (36.9 C) (11/13 2300) Temp Source: Oral (11/14 0300) Pulse Rate: 74 (11/14 0300)  Labs:  Recent Labs  09/16/14 0300 09/17/14 0215 09/18/14 0345  HGB 9.1* 9.3* 8.9*  HCT 28.3* 28.6* 27.2*  PLT 274 273 257  LABPROT 32.6* 31.4* 27.1*  INR 3.15* 3.00* 2.48*  CREATININE 2.40* 2.39* 2.61*    Estimated Creatinine Clearance: 27.3 mL/min (by C-G formula based on Cr of 2.61).  Assessment: 72yom s/p LVAD (10/19/13) continues on coumadin. INR was supratherapeutic on admission - doses held since 11/10. Today's INR has trended down and is at goal at 2.48. Patient will have a ureteroscopy on Monday and Drs. Grapey and Bensimhon want the INR to be ~2 so will re-dose cautiously tonight. Hgb has trended down slightly to 8.9, plt remain wnl and stable. Per nurse, hematuria occurs off and on.   Home dose: 2mg  daily except 4mg  Mon/Fri  Goal of Therapy:  INR 2-3 Monitor platelets by anticoagulation protocol: Yes   Plan:  1) Coumadin 1 mg PO x 1 tonight 2) INR in AM 3) Continue to monitor CBC and s/s bleeding  Harolyn Rutherford, PharmD Clinical Pharmacist - Resident Pager: [phone] Pharmacy: [phone] 09/18/2014 7:56 AM

## 2014-09-18 NOTE — Progress Notes (Signed)
HeartMate 2 Rounding Note  Subjective:     Naven Giambalvo is a 73 yo male with h/o VT, PAD and severe systolic HF (EF 51-02%) due to mixed ischemic/nonischemic CM he is s/p HM II LVAD implant for destination therapy on October 19, 2013. On 09/01/14 underwent cystoscopy and flexible ureteroscopy with lithotripsy and basket extraction.   VAD implantation was complicated by VT, syncope, A fib/Aflutter and persistent low output felt to be due to RHF. RHC showed low cardiac output but no evidence of RV failure on Milrinone. He was felt to be volume depleted. He was discharged home on 11/18/13 on mirlinone and PRN lasix 20 mg for a weight of 200 pounds or greater. Milrinone has since been weaned off.  Events: 09/01/14 cystoscopy and flexible ureteroscopy with lithotripsy and basket extraction.  09/09/10 CT ab/pelvis: interval fragmentation of L renal pelvis calculus, one fragment is lodged in mid ureter with mild hydronephrosis prox to calculus. Admitted 11/9 with SOB fatigue Echo (11/10) RV looks good.  11/10 Renal US, mild L hydronephrosis, bilateral renal cysts.  Feels good. Walking unit without problem. Diuretics on hold. Hematuria clearing. Creatinine climbing 2.4->2.6. INR 2.5  MAPs 80-90s  LVAD INTERROGATION:  HeartMate II LVAD:  Flow 5.1 liters/min, speed 9200, power 6.0, PI 6.4.  Occasional PI events  Objective:    Vital Signs:   Temp:  [97.9 F (36.6 C)-98.5 F (36.9 C)] 97.9 F (36.6 C) (11/14 1208) Pulse Rate:  [74-75] 75 (11/14 1208) Resp:  [20] 20 (11/13 1600) SpO2:  [97 %-98 %] 98 % (11/14 1208) Weight:  [85.095 kg (187 lb 9.6 oz)] 85.095 kg (187 lb 9.6 oz) (11/14 0300) Last BM Date: 09/17/14 Mean arterial Pressure 90-110s  Intake/Output:   Intake/Output Summary (Last 24 hours) at 09/18/14 1236 Last data filed at 09/18/14 0900  Gross per 24 hour  Intake   1080 ml  Output   1300 ml  Net   -220 ml     Physical Exam: General:  Well appearing. No resp difficulty,  sitting in chair HEENT: normal Neck: supple. JVP 8-9, Carotids 2+ bilat; no bruits. No lymphadenopathy or thryomegaly appreciated. Cor: Mechanical heart sounds with LVAD hum present. Lungs: clear Abdomen: soft, nontender, nondistended. No hepatosplenomegaly. No bruits or masses. Good bowel sounds. Driveline: C/D/I; securement device intact and driveline incorporated Extremities: no cyanosis, clubbing, rash, tr-1+ edema Neuro: alert & orientedx3, cranial nerves grossly intact. moves all 4 extremities w/o difficulty. Affect pleasant  Telemetry: a-paced 70s  Labs: Basic Metabolic Panel:  Recent Labs Lab 09/14/14 0156 09/15/14 0315 09/16/14 0300 09/17/14 0215 09/18/14 0345  NA 140 139 140 140 138  K 4.8 5.5* 4.9 5.2 4.7  CL 102 103 103 103 102  CO2 28 25 26 25 24   GLUCOSE 96 103* 99 100* 105*  BUN 25* 26* 28* 29* 31*  CREATININE 2.52* 2.35* 2.40* 2.39* 2.61*  CALCIUM 8.5 8.8 8.7 8.9 8.8    Liver Function Tests:  Recent Labs Lab 09/13/14 1024  AST 24  ALT 15  ALKPHOS 106  BILITOT 0.4  PROT 6.7  ALBUMIN 3.6   No results for input(s): LIPASE, AMYLASE in the last 168 hours. No results for input(s): AMMONIA in the last 168 hours.  CBC:  Recent Labs Lab 09/13/14 1024 09/14/14 0156 09/15/14 0315 09/16/14 0300 09/17/14 0215 09/18/14 0345  WBC 5.3 5.9 6.2 6.2 6.1 6.1  NEUTROABS 3.7  --   --   --   --   --   HGB 8.6*  7.6* 9.7* 9.1* 9.3* 8.9*  HCT 26.3* 23.5* 30.3* 28.3* 28.6* 27.2*  MCV 96.7 95.1 96.5 95.3 97.3 98.2  PLT 317 296 304 274 273 257    INR:  Recent Labs Lab 09/14/14 0156 09/15/14 0315 09/16/14 0300 09/17/14 0215 09/18/14 0345  INR 3.23* 3.37* 3.15* 3.00* 2.48*    Other results:    Imaging: No results found.   Medications:     Scheduled Medications: . amiodarone  200 mg Oral BID  . amLODipine  10 mg Oral Daily  . aspirin EC  325 mg Oral Daily  . atorvastatin  80 mg Oral q1800  . docusate sodium  200 mg Oral Daily  . ferrous  gluconate  324 mg Oral TID WC  . hydrALAZINE  25 mg Oral TID  . levothyroxine  125 mcg Oral QAC breakfast  . pantoprazole  40 mg Oral Daily  . ranolazine  500 mg Oral BID  . warfarin  1 mg Oral ONCE-1800  . Warfarin - Pharmacist Dosing Inpatient   Does not apply q1800    Infusions:    PRN Medications: acetaminophen **OR** acetaminophen   Assessment:   1) A/C HF - s/p LVAD 10/2014 2) Afib/Aflutter - s/p DC-CV 06/04/14; in NSR 3) HTN 4) LVAD 5) Acute on chronic renal failure stage III 6) Hematuria 7) Acute on chronic blood loss anemia 8) s/p Renal stone   Plan/Discussion:    Clinically improved.   Volume status stable. Creatinine up slightly.. Worsening renal function possibly related to renal calculi/hydronephrosis. Plan for stone fragment extraction Monday 1pm. Ok to continue coumadin. Use lasix prn.   MAPs better controlled 80-90s, will continue current medications. No ACE-I or Arlyce Harman with A/CKD.   Continue to ambulate with CR.   I reviewed the LVAD parameters from today, and compared the results to the patient's prior recorded data.  No programming changes were made.  The LVAD is functioning within specified parameters.  The patient performs LVAD self-test daily.  LVAD interrogation was negative for any significant power changes, alarms or PI events/speed drops.  LVAD equipment check completed and is in good working order.  Back-up equipment present.   LVAD education done on emergency procedures and precautions and reviewed exit site care.  Length of Stay: 5  Glori Bickers MD  09/18/2014, 12:36 PM  VAD Team --- VAD ISSUES ONLY--- Pager [phone] (7am - 7am)  Advanced Heart Failure Team  Pager [phone] (M-F; 7a - 4p)  Please contact [facility] for night-coverage after hours (4p -7a ) and weekends on [email or web address]

## 2014-09-19 LAB — LACTATE DEHYDROGENASE: LDH: 219 U/L (ref 94–250)

## 2014-09-19 LAB — CBC
HCT: 26.8 % — ABNORMAL LOW (ref 39.0–52.0)
Hemoglobin: 8.7 g/dL — ABNORMAL LOW (ref 13.0–17.0)
MCH: 31.5 pg (ref 26.0–34.0)
MCHC: 32.5 g/dL (ref 30.0–36.0)
MCV: 97.1 fL (ref 78.0–100.0)
Platelets: 221 10*3/uL (ref 150–400)
RBC: 2.76 MIL/uL — ABNORMAL LOW (ref 4.22–5.81)
RDW: 16.3 % — AB (ref 11.5–15.5)
WBC: 4.9 10*3/uL (ref 4.0–10.5)

## 2014-09-19 LAB — BASIC METABOLIC PANEL
Anion gap: 11 (ref 5–15)
BUN: 31 mg/dL — AB (ref 6–23)
CO2: 24 mEq/L (ref 19–32)
CREATININE: 2.54 mg/dL — AB (ref 0.50–1.35)
Calcium: 8.9 mg/dL (ref 8.4–10.5)
Chloride: 102 mEq/L (ref 96–112)
GFR calc Af Amer: 27 mL/min — ABNORMAL LOW (ref >90)
GFR, EST NON AFRICAN AMERICAN: 23 mL/min — AB (ref >90)
Glucose, Bld: 105 mg/dL — ABNORMAL HIGH (ref 70–99)
Potassium: 4.5 mEq/L (ref 3.7–5.3)
Sodium: 137 mEq/L (ref 137–147)

## 2014-09-19 LAB — PROTIME-INR
INR: 2.05 — ABNORMAL HIGH (ref 0.00–1.49)
Prothrombin Time: 23.3 seconds — ABNORMAL HIGH (ref 11.6–15.2)

## 2014-09-19 MED ORDER — WARFARIN SODIUM 2 MG PO TABS
2.0000 mg | ORAL_TABLET | Freq: Once | ORAL | Status: AC
  Administered 2014-09-19: 2 mg via ORAL
  Filled 2014-09-19: qty 1

## 2014-09-19 NOTE — Progress Notes (Signed)
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 On 09/01/14 underwent cystoscopy and flexible ureteroscopy with lithotripsy and basket extraction.   Events: 09/01/14 cystoscopy and flexible ureteroscopy with lithotripsy and basket extraction.  09/09/10 CT ab/pelvis: interval fragmentation of L renal pelvis calculus, one fragment is lodged in mid ureter with mild hydronephrosis prox to calculus. Admitted 11/9 with SOB fatigue Echo (11/10) RV looks good.  11/10 Renal US, mild L hydronephrosis, bilateral renal cysts.  Feels good. Walking unit without problem. Diuretics on hold. Hematuria worse today. Creatinine stable ~2.5. INR 2.05  MAPs 80-90s  LVAD INTERROGATION:  HeartMate II LVAD:  Flow 5.2 liters/min, speed 9200, power 6.0, PI 5.6.  Occasional PI events  Objective:    Vital Signs:   Temp:  [97.6 F (36.4 C)-98.5 F (36.9 C)] 97.6 F (36.4 C) (11/15 0754) Pulse Rate:  [75] 75 (11/15 0754) Resp:  [18] 18 (11/14 1600) SpO2:  [97 %-100 %] 98 % (11/15 0754) Last BM Date: 09/18/14 Mean arterial Pressure 90-110s  Intake/Output:   Intake/Output Summary (Last 24 hours) at 09/19/14 1028 Last data filed at 09/19/14 0900  Gross per 24 hour  Intake    840 ml  Output   1550 ml  Net   -710 ml     Physical Exam: General:  Well appearing. No resp difficulty, sitting in chair HEENT: normal Neck: supple. JVP 8, Carotids 2+ bilat; no bruits. No lymphadenopathy or thryomegaly appreciated. Cor: Mechanical heart sounds with LVAD hum present. Lungs: clear Abdomen: soft, nontender, nondistended. No hepatosplenomegaly. No bruits or masses. Good bowel sounds. Driveline: C/D/I; securement device intact and driveline incorporated Extremities: no cyanosis, clubbing, rash, tr edema Neuro: alert &  orientedx3, cranial nerves grossly intact. moves all 4 extremities w/o difficulty. Affect pleasant  Telemetry: a-paced 70s  Labs: Basic Metabolic Panel:  Recent Labs Lab 09/15/14 0315 09/16/14 0300 09/17/14 0215 09/18/14 0345 09/19/14 0251  NA 139 140 140 138 137  K 5.5* 4.9 5.2 4.7 4.5  CL 103 103 103 102 102  CO2 25 26 25 24 24   GLUCOSE 103* 99 100* 105* 105*  BUN 26* 28* 29* 31* 31*  CREATININE 2.35* 2.40* 2.39* 2.61* 2.54*  CALCIUM 8.8 8.7 8.9 8.8 8.9    Liver Function Tests:  Recent Labs Lab 09/13/14 1024  AST 24  ALT 15  ALKPHOS 106  BILITOT 0.4  PROT 6.7  ALBUMIN 3.6   No results for input(s): LIPASE, AMYLASE in the last 168 hours. No results for input(s): AMMONIA in the last 168 hours.  CBC:  Recent Labs Lab 09/13/14 1024  09/15/14 0315 09/16/14 0300 09/17/14 0215 09/18/14 0345 09/19/14 0251  WBC 5.3  < > 6.2 6.2 6.1 6.1 4.9  NEUTROABS 3.7  --   --   --   --   --   --   HGB 8.6*  < > 9.7* 9.1* 9.3* 8.9* 8.7*  HCT 26.3*  < > 30.3* 28.3* 28.6* 27.2* 26.8*  MCV 96.7  < > 96.5 95.3 97.3 98.2 97.1  PLT 317  < > 304 274 273 257 221  < > = values in this interval not displayed.  INR:  Recent Labs Lab 09/15/14 0315 09/16/14 0300 09/17/14 0215 09/18/14 0345 09/19/14 0251  INR 3.37* 3.15* 3.00* 2.48* 2.05*  Other results:    Imaging: No results found.   Medications:     Scheduled Medications: . amiodarone  200 mg Oral BID  . amLODipine  10 mg Oral Daily  . aspirin EC  325 mg Oral Daily  . atorvastatin  80 mg Oral q1800  . docusate sodium  200 mg Oral Daily  . ferrous gluconate  324 mg Oral TID WC  . hydrALAZINE  25 mg Oral TID  . levothyroxine  125 mcg Oral QAC breakfast  . pantoprazole  40 mg Oral Daily  . ranolazine  500 mg Oral BID  . warfarin  2 mg Oral ONCE-1800  . Warfarin - Pharmacist Dosing Inpatient   Does not apply q1800    Infusions:    PRN Medications: acetaminophen **OR** acetaminophen   Assessment:    1) A/C HF - s/p LVAD 10/2014 2) Afib/Aflutter - s/p DC-CV 06/04/14; in NSR 3) HTN 4) LVAD 5) Acute on chronic renal failure stage III 6) Hematuria 7) Acute on chronic blood loss anemia 8) s/p Renal stone   Plan/Discussion:    Clinically improved.   Volume status stable. Creatinine stable now. Worsening renal function possibly related to renal calculi/hydronephrosis. Plan for stone fragment extraction tomorrow at [place] to continue coumadin. Use lasix prn.   MAPs better controlled 80-90s, will continue current medications. No ACE-I or Arlyce Harman with A/CKD.   Continue to ambulate with CR.   I reviewed the LVAD parameters from today, and compared the results to the patient's prior recorded data.  No programming changes were made.  The LVAD is functioning within specified parameters.  The patient performs LVAD self-test daily.  LVAD interrogation was negative for any significant power changes, alarms or PI events/speed drops.  LVAD equipment check completed and is in good working order.  Back-up equipment present.   LVAD education done on emergency procedures and precautions and reviewed exit site care.  Length of Stay: 6  Glori Bickers MD  09/19/2014, 10:28 AM  VAD Team --- VAD ISSUES ONLY--- Pager [phone] (7am - 7am)  Advanced Heart Failure Team  Pager [phone] (M-F; 7a - 4p)  Please contact [facility] for night-coverage after hours (4p -7a ) and weekends on [email or web address]

## 2014-09-19 NOTE — Progress Notes (Signed)
ANTICOAGULATION CONSULT NOTE - Follow Up Consult  Pharmacy Consult for Coumadin Indication: LVAD  Allergies  Allergen Reactions  . Demerol [Meperidine] Other (See Comments)    Paralysis. Could only move eyes.  . Meperidine Hcl Other (See Comments)    "CAN'T MOVE" CATATONIC PER PT.  [name] Opium Other (See Comments)    "CAN'T MOVE" CATATONIC PER PT.    Patient Measurements: Height: 6' (182.9 cm) Weight: 187 lb 9.6 oz (85.095 kg) IBW/kg (Calculated) : 77.6  Vital Signs: Temp: 97.6 F (36.4 C) (11/15 0754) Temp Source: Oral (11/15 0754) Pulse Rate: 75 (11/15 0754)  Labs:  Recent Labs  09/17/14 0215 09/18/14 0345 09/19/14 0251  HGB 9.3* 8.9* 8.7*  HCT 28.6* 27.2* 26.8*  PLT 273 257 221  LABPROT 31.4* 27.1* 23.3*  INR 3.00* 2.48* 2.05*  CREATININE 2.39* 2.61* 2.54*    Estimated Creatinine Clearance: 28 mL/min (by C-G formula based on Cr of 2.54).  Assessment: 72yom s/p LVAD (10/19/13) continues on coumadin. INR was supratherapeutic on admission - doses held since 11/10. Today's INR has continued to trend down but remains at goal at 2.05 after low dose given yesterday evening. Patient will have a ureteroscopy on Monday and Drs. Grapey and Bensimhon want the INR to be ~2 so will re-dose cautiously again tonight. Hgb has trended down slightly to 8.7, plt remain wnl and stable.   Home dose: 2mg  daily except 4mg  Mon/Fri  Goal of Therapy:  INR 2-3 Monitor platelets by anticoagulation protocol: Yes   Plan:  1) Coumadin 2 mg PO x 1 tonight 2) INR in AM 3) Continue to monitor CBC and s/s bleeding  Harolyn Rutherford, PharmD Clinical Pharmacist - Resident Pager: [phone] Pharmacy: [phone] 09/19/2014 8:03 AM

## 2014-09-20 ENCOUNTER — Encounter (HOSPITAL_COMMUNITY)
Admission: EM | Disposition: A | Discharge status: Home / Self Care | Payer: Self-pay | Source: Home / Self Care | Attending: Internal Medicine

## 2014-09-20 ENCOUNTER — Inpatient Hospital Stay (HOSPITAL_COMMUNITY): Payer: Medicare Other | Admitting: Anesthesiology

## 2014-09-20 ENCOUNTER — Ambulatory Visit (HOSPITAL_COMMUNITY): Admission: RE | Admit: 2014-09-20 | Payer: Medicare Other | Source: Ambulatory Visit | Admitting: Urology

## 2014-09-20 ENCOUNTER — Inpatient Hospital Stay (HOSPITAL_COMMUNITY): Payer: Medicare Other

## 2014-09-20 ENCOUNTER — Encounter (HOSPITAL_COMMUNITY): Payer: Self-pay | Admitting: *Deleted

## 2014-09-20 HISTORY — PX: CYSTOSCOPY WITH RETROGRADE PYELOGRAM, URETEROSCOPY AND STENT PLACEMENT: SHX5789

## 2014-09-20 LAB — LACTATE DEHYDROGENASE: LDH: 218 U/L (ref 94–250)

## 2014-09-20 LAB — BASIC METABOLIC PANEL
Anion gap: 13 (ref 5–15)
BUN: 35 mg/dL — ABNORMAL HIGH (ref 6–23)
CALCIUM: 8.7 mg/dL (ref 8.4–10.5)
CO2: 24 meq/L (ref 19–32)
Chloride: 105 mEq/L (ref 96–112)
Creatinine, Ser: 2.48 mg/dL — ABNORMAL HIGH (ref 0.50–1.35)
GFR calc Af Amer: 28 mL/min — ABNORMAL LOW (ref >90)
GFR calc non Af Amer: 24 mL/min — ABNORMAL LOW (ref >90)
GLUCOSE: 100 mg/dL — AB (ref 70–99)
Potassium: 4.7 mEq/L (ref 3.7–5.3)
Sodium: 142 mEq/L (ref 137–147)

## 2014-09-20 LAB — CBC
HEMATOCRIT: 25.8 % — AB (ref 39.0–52.0)
HEMOGLOBIN: 8.4 g/dL — AB (ref 13.0–17.0)
MCH: 31.9 pg (ref 26.0–34.0)
MCHC: 32.6 g/dL (ref 30.0–36.0)
MCV: 98.1 fL (ref 78.0–100.0)
Platelets: 207 10*3/uL (ref 150–400)
RBC: 2.63 MIL/uL — ABNORMAL LOW (ref 4.22–5.81)
RDW: 16.4 % — ABNORMAL HIGH (ref 11.5–15.5)
WBC: 4.5 10*3/uL (ref 4.0–10.5)

## 2014-09-20 LAB — SURGICAL PCR SCREEN
MRSA, PCR: NEGATIVE
Staphylococcus aureus: NEGATIVE

## 2014-09-20 LAB — PROTIME-INR
INR: 1.83 — ABNORMAL HIGH (ref 0.00–1.49)
Prothrombin Time: 21.3 seconds — ABNORMAL HIGH (ref 11.6–15.2)

## 2014-09-20 SURGERY — CYSTOURETEROSCOPY, WITH RETROGRADE PYELOGRAM AND STENT INSERTION
Anesthesia: General | Site: Ureter | Laterality: Left

## 2014-09-20 MED ORDER — ONDANSETRON HCL 4 MG/2ML IJ SOLN
INTRAMUSCULAR | Status: DC | PRN
  Administered 2014-09-20: 4 mg via INTRAVENOUS

## 2014-09-20 MED ORDER — WARFARIN SODIUM 4 MG PO TABS
4.0000 mg | ORAL_TABLET | Freq: Once | ORAL | Status: AC
  Administered 2014-09-20: 4 mg via ORAL
  Filled 2014-09-20: qty 1

## 2014-09-20 MED ORDER — LACTATED RINGERS IV SOLN
INTRAVENOUS | Status: DC | PRN
  Administered 2014-09-20: 13:00:00 via INTRAVENOUS

## 2014-09-20 MED ORDER — LACTATED RINGERS IV SOLN
INTRAVENOUS | Status: DC | PRN
  Administered 2014-09-20: 14:00:00 via INTRAVENOUS

## 2014-09-20 MED ORDER — ETOMIDATE 2 MG/ML IV SOLN
INTRAVENOUS | Status: DC | PRN
  Administered 2014-09-20: 16 mg via INTRAVENOUS

## 2014-09-20 MED ORDER — IOHEXOL 300 MG/ML  SOLN
INTRAMUSCULAR | Status: DC | PRN
  Administered 2014-09-20: 10 mL via URETHRAL

## 2014-09-20 MED ORDER — LACTATED RINGERS IV SOLN: INTRAVENOUS | Status: DC

## 2014-09-20 MED ORDER — CEFAZOLIN SODIUM-DEXTROSE 2-3 GM-% IV SOLR
2.0000 g | INTRAVENOUS | Status: AC
  Administered 2014-09-20: 2 g via INTRAVENOUS
  Filled 2014-09-20: qty 50

## 2014-09-20 MED ORDER — SODIUM CHLORIDE 0.9 % IR SOLN
Status: DC | PRN
  Administered 2014-09-20: 5000 mL

## 2014-09-20 MED ORDER — ONDANSETRON HCL 4 MG/2ML IJ SOLN: 4.0000 mg | Freq: Once | INTRAMUSCULAR | Status: AC | PRN

## 2014-09-20 MED ORDER — HYDROMORPHONE HCL 1 MG/ML IJ SOLN: 0.2500 mg | INTRAMUSCULAR | Status: DC | PRN

## 2014-09-20 MED ORDER — CEPHALEXIN 250 MG PO CAPS
250.0000 mg | ORAL_CAPSULE | Freq: Every day | ORAL | Status: DC
  Administered 2014-09-20 – 2014-09-23 (×4): 250 mg via ORAL
  Filled 2014-09-20 (×4): qty 1

## 2014-09-20 MED ORDER — FENTANYL CITRATE 0.05 MG/ML IJ SOLN
INTRAMUSCULAR | Status: DC | PRN
  Administered 2014-09-20: 25 ug via INTRAVENOUS
  Administered 2014-09-20: 50 ug via INTRAVENOUS

## 2014-09-20 MED ORDER — LIDOCAINE HCL 2 % EX GEL
CUTANEOUS | Status: DC | PRN
  Administered 2014-09-20: 1 via URETHRAL

## 2014-09-20 MED ORDER — PHENYLEPHRINE HCL 10 MG/ML IJ SOLN
10.0000 mg | INTRAVENOUS | Status: DC | PRN
  Administered 2014-09-20: 15 ug/min via INTRAVENOUS

## 2014-09-20 MED ORDER — SUCCINYLCHOLINE CHLORIDE 20 MG/ML IJ SOLN
INTRAMUSCULAR | Status: DC | PRN
  Administered 2014-09-20: 100 mg via INTRAVENOUS

## 2014-09-20 SURGICAL SUPPLY — 20 items
BAG URINE DRAINAGE (UROLOGICAL SUPPLIES) ×3 IMPLANT
BASKET ZERO TIP NITINOL 2.4FR (BASKET) ×6 IMPLANT
BSKT STON RTRVL ZERO TP 2.4FR (BASKET) ×4
CATH INTERMIT  6FR 70CM (CATHETERS) ×3 IMPLANT
CLOTH BEACON ORANGE TIMEOUT ST (SAFETY) ×3 IMPLANT
DRAPE CAMERA CLOSED 9X96 (DRAPES) ×3 IMPLANT
FIBER LASER FLEXIVA 1000 (UROLOGICAL SUPPLIES) IMPLANT
FIBER LASER FLEXIVA 200 (UROLOGICAL SUPPLIES) IMPLANT
FIBER LASER FLEXIVA 365 (UROLOGICAL SUPPLIES) IMPLANT
FIBER LASER FLEXIVA 550 (UROLOGICAL SUPPLIES) IMPLANT
FIBER LASER TRAC TIP (UROLOGICAL SUPPLIES) IMPLANT
GLOVE BIOGEL M STRL SZ7.5 (GLOVE) ×15 IMPLANT
GOWN STRL REUS W/TWL XL LVL3 (GOWN DISPOSABLE) ×9 IMPLANT
GUIDEWIRE .038 (WIRE) IMPLANT
GUIDEWIRE ANG ZIPWIRE 038X150 (WIRE) IMPLANT
GUIDEWIRE STR DUAL SENSOR (WIRE) ×3 IMPLANT
IV NS IRRIG 3000ML ARTHROMATIC (IV SOLUTION) IMPLANT
PACK CYSTO (CUSTOM PROCEDURE TRAY) ×3 IMPLANT
SHEATH ACCESS URETERAL 38CM (SHEATH) ×3 IMPLANT
STENT CONTOUR 7FRX24X.038 (STENTS) ×3 IMPLANT

## 2014-09-20 NOTE — Progress Notes (Signed)
ANTICOAGULATION CONSULT NOTE - Follow Up Consult  Pharmacy Consult for Coumadin Indication: LVAD  Allergies  Allergen Reactions  . Demerol [Meperidine] Other (See Comments)    Paralysis. Could only move eyes.  . Meperidine Hcl Other (See Comments)    "CAN'T MOVE" CATATONIC PER PT.  [name] Opium Other (See Comments)    "CAN'T MOVE" CATATONIC PER PT.    Patient Measurements: Height: 6' (182.9 cm) Weight: 187 lb 8 oz (85.049 kg) IBW/kg (Calculated) : 77.6  Vital Signs: Temp: 97.7 F (36.5 C) (11/16 0727) Temp Source: Oral (11/16 0727)  Labs:  Recent Labs  09/18/14 0345 09/19/14 0251 09/20/14 0212  HGB 8.9* 8.7* 8.4*  HCT 27.2* 26.8* 25.8*  PLT 257 221 207  LABPROT 27.1* 23.3* 21.3*  INR 2.48* 2.05* 1.83*  CREATININE 2.61* 2.54* 2.48*    Estimated Creatinine Clearance: 28.7 mL/min (by C-G formula based on Cr of 2.48).  Assessment: 72yom s/p LVAD (10/19/13) continues on Coumadin. INR was supratherapeutic on admission - doses held since 11/10. Today's INR has continued to trend down, is now slightly below goal. Patient will have a ureteroscopy on today. Hgb has trended down slightly to 8.4, plt remain wnl and fairly stable.   Home dose: 2mg  daily except 4mg  Mon/Fri  Goal of Therapy:  INR 2-3 Monitor platelets by anticoagulation protocol: Yes   Plan:  1) Coumadin 4 mg PO x 1 tonight 2) INR in AM 3) Continue to monitor CBC and s/s bleeding  Uvaldo Rising, BCPS  Clinical Pharmacist Pager [phone]  09/20/2014 8:34 AM

## 2014-09-20 NOTE — Progress Notes (Signed)
Dr Risa Grill notified and reported that Frank Girt Rn will arrange for NNP?PA to write post procedure orders upon transfer back to [facility].

## 2014-09-20 NOTE — H&P (Signed)
History of Present Illness   Harshil originally presented as a referral from Dr Haroldine Laws and the heart failure LVAD team for further assessment of some gross hematuria and a recently diagnosed large left UPJ renal calculus. [name] is currently 73 years of age. He has an extremely complicated past medical history. The patient had LVAD placement and has done very well with that. He is on chronic anticoagulation. I have reviewed his surgeries and his past medical history as well as his current medications. He has no prior urologic history. He has not had any definitive known episodes of nephrolithiasis. The patient recently developed some discolored urine with gross hematuria. He has not had any significant flank or abdominal discomfort. CT scan revealed a 1.1 x 1.3 cm stone at his left ureteropelvic junction. There is mild hydronephrosis. Creatinine was stable at 1.42. It appears that his creatinine typically ranges from approximately 1.4 to slightly over 2. Urine culture remains pending at this time.   I was able to contact Dr Haroldine Laws this morning to discuss the situation at length. We talked about some of the logistical issues with regard to anesthesia and monitoring and what typically takes place. We talked about options for surgical intervention that would or would not require discontinuation of anticoagulation as well as the option of a simple temporizing maneuver with double J stent alone. Dr Haroldine Laws felt that his quality of life had improved dramatically on LVAD and was doing quite well. He felt that we should press ahead with an attempted definitive management of the stone rather than any kind of temporizing situation with double J stent placement. He is willing to assemble a team to come [facility] where we have the technology and equipment necessary to deal with the stone in the most efficacious manner.   At the time of our first attempt at ureteroscopy the ureter was too narrow to  successfully place an access sheath. For that reason a double-J stent was placed. We came back 2 weeks later and were able to perform digital flexible ureteroscopy and fragment the stone. We felt that there was excellent fragmentation and that the majority of pieces were removed. Unfortunately he didn't evolve some recurrent hematuria and his creatinine did increase. He has been essentially pain-free but has been noted to have mild hydronephrosis and a number of fragments within the ureter as well as within the lower pole of the kidney. While the majority of the stone has been successfully treated he does have ongoing issues. Because of ongoing hematuria he elected to proceed with definitive attempt at removal of fragments rather and waiting for these to try to pass spontaneously. He presents now for additional treatment.        Past Medical History Problems  1. History of Bradycardia (427.89) 2. History of CAD (coronary artery disease) (414.00) 3. History of Chronic clinical systolic heart failure (027.25) 4. History of Claudication (443.9) 5. History of anemia (V12.3) 6. History of arthritis (V13.4) 7. History of cardiovascular disorder (V12.50) 8. History of congestive heart failure (V12.59) 9. History of cytopenia (V12.3) 10. History of hyperlipidemia (V12.29) 11. History of hypertension (V12.59) 12. History of hypothyroidism (V12.29) 13. History of myocardial infarction (412) 14. History of peripheral vascular disease (V12.59) 15. History of renal insufficiency syndrome (V13.09) 16. History of sleep apnea (V13.89) 17. History of stroke (V12.54) 18. History of transient cerebral ischemia (V12.54) 19. History of Ischemic cardiomyopathy (414.8) 20. 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 History of Ventricular tachycardia (paroxysmal) (427.1)  Surgical History Problems  1. History of Heart Surgery 2. History of Pacemaker Placement  Current Meds 1. Adult Aspirin Low  Strength 81 MG TBDP; Therapy: (Recorded:25Sep2015) to Recorded 2. Amiodarone HCl - 200 MG Oral Tablet; Therapy: (Recorded:25Sep2015) to Recorded 3. Cal-Mag-Zinc TABS; Therapy: (Recorded:25Sep2015) to Recorded 4. Colace CAPS; Therapy: (Recorded:25Sep2015) to Recorded 5. Coumadin 2 MG Oral Tablet (Warfarin Sodium); Therapy: (Recorded:25Sep2015) to Recorded 6. Digoxin 125 MCG Oral Tablet; Therapy: (Recorded:25Sep2015) to Recorded 7. Fergon TABS; Therapy: (Recorded:25Sep2015) to Recorded 8. Lipitor 80 MG Oral Tablet (Atorvastatin Calcium); Therapy: (Recorded:25Sep2015) to Recorded 9. Multi-Day TABS; Therapy: (Recorded:25Sep2015) to Recorded 10. Nitrostat SUBL;  Therapy: (Recorded:25Sep2015) to Recorded 11. Norvasc 10 MG Oral Tablet (AmLODIPine Besylate);  Therapy: (Recorded:25Sep2015) to Recorded 12. Protonix 40 MG Intravenous Solution Reconstituted (Pantoprazole Sodium);  Therapy: (Recorded:25Sep2015) to Recorded 13. Ranexa 500 MG Oral Tablet Extended Release 12 Hour;  Therapy: (Recorded:25Sep2015) to Recorded 14. Synthroid 125 MCG Oral Tablet (Levothyroxine Sodium);  Therapy: (Recorded:25Sep2015) to Recorded  Allergies Medication  1. Demerol TABS  Family History Problems  1. Family history of heart failure (V17.49) : Mother, Father  Social History Problems   Alcohol use (V49.89)  socially  Caffeine use (V49.89)  2 qd  Father's age  71yrs, heart faliure  Former smoker (V15.82)  Mother deceased  59yrs, heart faliure  Retired  Single  Two children  Review of Systems Genitourinary, constitutional, skin, eye, otolaryngeal, hematologic/lymphatic, cardiovascular, pulmonary, endocrine, musculoskeletal, gastrointestinal, neurological and psychiatric system(s) were reviewed and pertinent findings if present are noted.  Genitourinary: nocturia.  Gastrointestinal: vomiting.  Hematologic/Lymphatic: a tendency to easily bruise.   Cardiovascular: leg swelling.  Respiratory: cough.  Musculoskeletal: joint pain.   Vitals Vital Signs [Data Includes: Last 1 Day]  Recorded: [id number] 09:06AM  Height: 6 ft 1 in Weight: 199 lb  BMI Calculated: 26.25 BSA Calculated: 2.15 Temperature: 97.6 F  Physical Exam Constitutional: Well nourishedandwell developed. No acute distress.  ENT:. The ears and nose are normal in appearance.  Neck: The appearance of the neck is normalandno neck mass is present.  Pulmonary: No respiratory distressandnormal respiratory rhythm and effort.  Abdomen: The abdomen is soft and nontender. No masses are palpated. No CVA tenderness. No hernias are palpable. No hepatosplenomegaly noted.  Genitourinary: Examination of the penis demonstrates no discharge,no masses,no lesionsanda normal meatus. The scrotum is without lesions. The right epididymis is palpably normalandnon-tender. The left epididymis is palpably normalandnon-tender. The right testis is non-tenderandwithout masses. The left testis is non-tenderandwithout masses.  Skin: Normal skin turgor,no visible rashandno visible skin lesions.  Neuro/Psych:. Mood and affect are appropriate.   Results/Data  30 Jul 2014 8:58 AM  UA With REFLEX   COLOR RED   APPEARANCE CLOUDY   SPECIFIC GRAVITY 1.020   pH 6.5   GLUCOSE 100   BILIRUBIN NEG   KETONE TRACE   BLOOD LARGE   PROTEIN > 300   UROBILINOGEN 1   NITRITE POS   LEUKOCYTE ESTERASE TRACE   SQUAMOUS EPITHELIAL/HPF NONE SEEN   WBC NONE SEEN   CRYSTALS NONE SEEN   CASTS NONE SEEN   RBC TNTC   BACTERIA NONE SEEN    Assessment Assessed  1. Nephrolithiasis (592.0) 2. Hydronephrosis (591) 3. Gross hematuria (599.71)  Plan Gross hematuria  1. Follow-up Schedule Surgery Office Follow-up Status: Complete Done: [id number]  Discussion/Summary Mr. Rio is to undergo repeat attempt at ureteroscopy.  We will probably require rigid and flexible ureteroscopy. He will have replacement of a double-J stent. Hopefully we can render him stone  free. Ongoing challenges include previously difficult ureter to navigate an ongoing issues with need for continuous anticoagulation.

## 2014-09-20 NOTE — Transfer of Care (Signed)
Immediate Anesthesia Transfer of Care Note  Patient: [name]  Procedure(s) Performed: Procedure(s): CYSTOSCOPY WITH RETROGRADE PYELOGRAM, URETEROSCOPY AND STENT PLACEMENT, stone removal (Left)  Patient Location: PACU  Anesthesia Type:General  Level of Consciousness: awake, alert , oriented and patient cooperative  Airway & Oxygen Therapy: Patient Spontanous Breathing and Patient connected to face mask oxygen  Post-op Assessment: Report given to PACU RN and Post -op Vital signs reviewed and stable  Post vital signs: Reviewed and stable  Complications: No apparent anesthesia complications

## 2014-09-20 NOTE — Progress Notes (Signed)
Notified by care link at 1115 that they would not be able to transport pt to procedure at [facility]. This had been scheduled and organized at a prior time. [place] notified per [facility]. Notified Molly (lvad coordinator). Notified both EMS and [place].  [name] is with this transport and will be staying with patient. EMS here. Pt will go via EMS.

## 2014-09-20 NOTE — Progress Notes (Signed)
VAD coordinator paged to [place] to accompany patient to [facility] for cystoscopy with retrograde pyelogram and ureteroscopy for stone removal.  Pt transported per EMS to [facility] holding room. Dr. Linna Caprice met patient and placed left radial arterial line; also placed left EJ for IV access.  Time:  MAP:  Flow:  PI:  Power:  Speed: Pre: 12:30  98  4.7  7.5  5.3  9200  Intra: 1:55 pm 120  4.1  8.0  5.0  9200 2:00  96  4.7  6.7  5.3  9200 2:15  101  4.5  7.2  5.2  9200 2:30  89  4.8  5.7  5.4  9200 2:45  88  4.3  5.9  5.0  9200 3:00  85  4.6  5.2  5.3  9200 3:15  90  4.6  5.7  5.3  9200  Post: 3:30  94  4.8  7.1  5.4  9200 3:45  86  4.9  6.6  5.5  9200 4:00  84  5.2  6.5  5.6  9200  Pt underwent cystoscopy with retrograde pyelogram, ureteroscopy with stone fragment removal and stent placement. Pt extubated in OR; awake and alert in PACU. Wife updated by Dr. Risa Grill.   [facility] arrived and transported patient from PACU to [facility]; VAD coordinator remained with patient during transport. Pt stable with no VAD alarms or equipment issues.

## 2014-09-20 NOTE — Anesthesia Preprocedure Evaluation (Signed)
Anesthesia Evaluation  Patient identified by MRN, date of birth, ID band Patient awake    Reviewed: Allergy & Precautions, H&P , NPO status , Patient's Chart, lab work & pertinent test results  Airway Mallampati: I  TM Distance: >3 FB     Dental  (+) Edentulous Upper, Edentulous Lower   Pulmonary former smoker,  breath sounds clear to auscultation        Cardiovascular hypertension, Rhythm:Regular     Neuro/Psych    GI/Hepatic   Endo/Other    Renal/GU      Musculoskeletal   Abdominal   Peds  Hematology   Anesthesia Other Findings   Reproductive/Obstetrics                             Anesthesia Physical Anesthesia Plan  ASA: III  Anesthesia Plan: General   Post-op Pain Management:    Induction: Intravenous  Airway Management Planned: Oral ETT  Additional Equipment: Arterial line  Intra-op Plan:   Post-operative Plan: Extubation in OR  Informed Consent: I have reviewed the patients History and Physical, chart, labs and discussed the procedure including the risks, benefits and alternatives for the proposed anesthesia with the patient or authorized representative who has indicated his/her understanding and acceptance.     Plan Discussed with: CRNA and Anesthesiologist  Anesthesia Plan Comments: (Cardiomyopathy ischemic/nonischemic S/P Heartmate ii LVAD insertion 10/19/14 L. Renal pelvis stone Acute on chronic renal insufficiency Cr 2.56 Chronic SOB H/O VT with AICD no discharges  Since LVAD insertion  Plan GA with art line and oral ETT  Roberts Gaudy )        Anesthesia Quick Evaluation

## 2014-09-20 NOTE — Progress Notes (Signed)
Pt received from OR accompained by Dr Lyda Perone and Marta Lamas RN.  Molly remaining at opts bedside and managing LVAD.

## 2014-09-20 NOTE — Anesthesia Postprocedure Evaluation (Signed)
  Anesthesia Post-op Note  Patient: [name]  Procedure(s) Performed: Procedure(s): CYSTOSCOPY WITH RETROGRADE PYELOGRAM, URETEROSCOPY AND STENT PLACEMENT, stone removal (Left)  Patient Location: PACU  Anesthesia Type:General  Level of Consciousness: awake, alert  and oriented  Airway and Oxygen Therapy: Patient Spontanous Breathing and Patient connected to nasal cannula oxygen  Post-op Pain: none  Post-op Assessment: Post-op Vital signs reviewed, Patient's Cardiovascular Status Stable, Respiratory Function Stable, Patent Airway, No signs of Nausea or vomiting and Pain level controlled  Post-op Vital Signs: stable  Last Vitals:  Filed Vitals:   09/20/14 1615  BP: 94/77  Pulse: 75  Temp: 36.2 C  Resp: 10    Complications: No apparent anesthesia complications

## 2014-09-20 NOTE — Progress Notes (Signed)
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 On 09/01/14 underwent cystoscopy and flexible ureteroscopy with lithotripsy and basket extraction.   Events: 09/01/14 cystoscopy and flexible ureteroscopy with lithotripsy and basket extraction.  09/09/10 CT ab/pelvis: interval fragmentation of L renal pelvis calculus, one fragment is lodged in mid ureter with mild hydronephrosis prox to calculus. Admitted 11/9 with SOB fatigue Echo (11/10) RV looks good.  11/10 Renal US, mild L hydronephrosis, bilateral renal cysts.  No hematuria this am. Feeling good. Creatinine stable ~2.48 INR 1.83  MAPs 90s  LVAD INTERROGATION:  HeartMate II LVAD:  Flow 5.1 liters/min, speed 9200, power 4.8, PI 6.9  Objective:    Vital Signs:   Temp:  [97.6 F (36.4 C)-98.5 F (36.9 C)] 97.7 F (36.5 C) (11/16 0727) Pulse Rate:  [75] 75 (11/15 1908) Resp:  [20] 20 (11/16 0727) SpO2:  [98 %-100 %] 100 % (11/15 1908) Weight:  [187 lb 8 oz (85.049 kg)] 187 lb 8 oz (85.049 kg) (11/16 0600) Last BM Date: 09/19/14 Mean arterial Pressure 90-110s  Intake/Output:   Intake/Output Summary (Last 24 hours) at 09/20/14 0743 Last data filed at 09/20/14 0600  Gross per 24 hour  Intake   1080 ml  Output   1900 ml  Net   -820 ml     Physical Exam: MAP 92 General:  Well appearing. No resp difficulty, sitting in chair HEENT: normal Neck: supple. JVP 8, Carotids 2+ bilat; no bruits. No lymphadenopathy or thryomegaly appreciated. Cor: Mechanical heart sounds with LVAD hum present. Lungs: clear Abdomen: soft, nontender, nondistended. No hepatosplenomegaly. No bruits or masses. Good bowel sounds. Driveline: C/D/I; securement device intact and driveline incorporated Extremities: no cyanosis, clubbing, rash, tr edema Neuro: alert &  orientedx3, cranial nerves grossly intact. moves all 4 extremities w/o difficulty. Affect pleasant  Telemetry: a-paced 70s  Labs: Basic Metabolic Panel:  Recent Labs Lab 09/16/14 0300 09/17/14 0215 09/18/14 0345 09/19/14 0251 09/20/14 0212  NA 140 140 138 137 142  K 4.9 5.2 4.7 4.5 4.7  CL 103 103 102 102 105  CO2 26 25 24 24 24   GLUCOSE 99 100* 105* 105* 100*  BUN 28* 29* 31* 31* 35*  CREATININE 2.40* 2.39* 2.61* 2.54* 2.48*  CALCIUM 8.7 8.9 8.8 8.9 8.7    Liver Function Tests:  Recent Labs Lab 09/13/14 1024  AST 24  ALT 15  ALKPHOS 106  BILITOT 0.4  PROT 6.7  ALBUMIN 3.6   No results for input(s): LIPASE, AMYLASE in the last 168 hours. No results for input(s): AMMONIA in the last 168 hours.  CBC:  Recent Labs Lab 09/13/14 1024  09/16/14 0300 09/17/14 0215 09/18/14 0345 09/19/14 0251 09/20/14 0212  WBC 5.3  < > 6.2 6.1 6.1 4.9 4.5  NEUTROABS 3.7  --   --   --   --   --   --   HGB 8.6*  < > 9.1* 9.3* 8.9* 8.7* 8.4*  HCT 26.3*  < > 28.3* 28.6* 27.2* 26.8* 25.8*  MCV 96.7  < > 95.3 97.3 98.2 97.1 98.1  PLT 317  < > 274 273 257 221 207  < > = values in this interval not displayed.  INR:  Recent Labs Lab 09/16/14 0300 09/17/14 0215 09/18/14 0345 09/19/14 0251 09/20/14 4481  INR 3.15* 3.00* 2.48* 2.05* 1.83*    Other results:    Imaging: No results found.   Medications:     Scheduled Medications: . amiodarone  200 mg Oral BID  . amLODipine  10 mg Oral Daily  . aspirin EC  325 mg Oral Daily  . atorvastatin  80 mg Oral q1800  . docusate sodium  200 mg Oral Daily  . ferrous gluconate  324 mg Oral TID WC  . hydrALAZINE  25 mg Oral TID  . levothyroxine  125 mcg Oral QAC breakfast  . pantoprazole  40 mg Oral Daily  . ranolazine  500 mg Oral BID  . Warfarin - Pharmacist Dosing Inpatient   Does not apply q1800    Infusions:    PRN Medications: acetaminophen **OR** acetaminophen   Assessment:   1) A/C HF - s/p LVAD 10/2014 2)  Paroxysmal Afib/Aflutter - s/p DC-CV 06/04/14; in NSR 3) HTN 4) LVAD 5) Acute on chronic renal failure stage III 6) Hematuria 7) Acute on chronic blood loss anemia 8) s/p Renal stone   Plan/Discussion:    Volume status stable. Creatinine stable now. Worsening renal function possibly related to renal calculi/hydronephrosis. Plan for stone fragment extraction today. INR 1.83   MAPs better controlled 90s, will continue current medications. No ACE-I or Arlyce Harman with A/CKD.   Continue to ambulate with CR.   I reviewed the LVAD parameters from today, and compared the results to the patient's prior recorded data.  No programming changes were made.  The LVAD is functioning within specified parameters.  The patient performs LVAD self-test daily.  LVAD interrogation was negative for any significant power changes, alarms or PI events/speed drops.  LVAD equipment check completed and is in good working order.  Back-up equipment present.   LVAD education done on emergency procedures and precautions and reviewed exit site care.  Length of Stay: 7  CLEGG,AMY NP-C  09/20/2014, 7:43 AM  VAD Team --- VAD ISSUES ONLY--- Pager [phone] (7am - 7am)  Advanced Heart Failure Team  Pager [phone] (M-F; 7a - 4p)  Please contact [facility] for night-coverage after hours (4p -7a ) and weekends on [email or web address]   Patient seen and examined with Darrick Grinder, NP. We discussed all aspects of the encounter. I agree with the assessment and plan as stated above.   For stone fragment extraction today at [facility]. Will give coumadin tonight. VAD parameters and volume status stable.   Daniel Bensimhon,MD 8:25 AM

## 2014-09-20 NOTE — Progress Notes (Addendum)
CSW (Clinical Education officer, museum) notified that pt needs transport to [facility] for 12:15 procedure and [facility] is unavailable. CSW spoke with supervisor who confirmed CSW could arrange PTAR. CSW spoke with [facility] dispatch they confirmed they could transport pt. CSW arranged for transport and provided medical necessity form to pt nurse.  ADDENDUM: CSW notified that pt does not need PTAR and has alternative transportation. CSW cancelled [employer], [place]

## 2014-09-21 ENCOUNTER — Encounter (HOSPITAL_COMMUNITY): Payer: Self-pay | Admitting: Anesthesiology

## 2014-09-21 ENCOUNTER — Telehealth: Payer: Self-pay | Admitting: Internal Medicine

## 2014-09-21 ENCOUNTER — Encounter (HOSPITAL_COMMUNITY): Payer: Self-pay | Admitting: Urology

## 2014-09-21 DIAGNOSIS — D649 Anemia, unspecified: Secondary | ICD-10-CM

## 2014-09-21 LAB — BASIC METABOLIC PANEL
ANION GAP: 12 (ref 5–15)
BUN: 31 mg/dL — AB (ref 6–23)
CO2: 23 mEq/L (ref 19–32)
Calcium: 8.6 mg/dL (ref 8.4–10.5)
Chloride: 103 mEq/L (ref 96–112)
Creatinine, Ser: 2.14 mg/dL — ABNORMAL HIGH (ref 0.50–1.35)
GFR calc non Af Amer: 29 mL/min — ABNORMAL LOW (ref >90)
GFR, EST AFRICAN AMERICAN: 33 mL/min — AB (ref >90)
Glucose, Bld: 105 mg/dL — ABNORMAL HIGH (ref 70–99)
POTASSIUM: 4.7 meq/L (ref 3.7–5.3)
Sodium: 138 mEq/L (ref 137–147)

## 2014-09-21 LAB — PROTIME-INR
INR: 1.8 — ABNORMAL HIGH (ref 0.00–1.49)
PROTHROMBIN TIME: 21.1 s — AB (ref 11.6–15.2)

## 2014-09-21 LAB — CBC
HCT: 25.3 % — ABNORMAL LOW (ref 39.0–52.0)
Hemoglobin: 7.9 g/dL — ABNORMAL LOW (ref 13.0–17.0)
MCH: 30.2 pg (ref 26.0–34.0)
MCHC: 31.2 g/dL (ref 30.0–36.0)
MCV: 96.6 fL (ref 78.0–100.0)
PLATELETS: 189 10*3/uL (ref 150–400)
RBC: 2.62 MIL/uL — ABNORMAL LOW (ref 4.22–5.81)
RDW: 16.4 % — ABNORMAL HIGH (ref 11.5–15.5)
WBC: 5.8 10*3/uL (ref 4.0–10.5)

## 2014-09-21 LAB — LACTATE DEHYDROGENASE: LDH: 254 U/L — ABNORMAL HIGH (ref 94–250)

## 2014-09-21 LAB — PREPARE RBC (CROSSMATCH)

## 2014-09-21 MED ORDER — FUROSEMIDE 10 MG/ML IJ SOLN: 40.0000 mg | Freq: Once | INTRAMUSCULAR | Status: DC

## 2014-09-21 MED ORDER — FUROSEMIDE 10 MG/ML IJ SOLN
40.0000 mg | Freq: Once | INTRAMUSCULAR | Status: AC
  Administered 2014-09-21: 40 mg via INTRAVENOUS

## 2014-09-21 MED ORDER — FUROSEMIDE 10 MG/ML IJ SOLN
INTRAMUSCULAR | Status: AC
  Administered 2014-09-21: 40 mg via INTRAVENOUS
  Filled 2014-09-21: qty 4

## 2014-09-21 MED ORDER — WARFARIN SODIUM 4 MG PO TABS
4.0000 mg | ORAL_TABLET | Freq: Once | ORAL | Status: AC
  Administered 2014-09-21: 4 mg via ORAL
  Filled 2014-09-21: qty 1

## 2014-09-21 MED ORDER — SODIUM CHLORIDE 0.9 % IV SOLN
Freq: Once | INTRAVENOUS | Status: AC
  Administered 2014-09-21: 15:00:00 via INTRAVENOUS

## 2014-09-21 NOTE — Progress Notes (Signed)
ANTICOAGULATION CONSULT NOTE - Follow Up Consult  Pharmacy Consult for Coumadin Indication: LVAD  Allergies  Allergen Reactions  . Demerol [Meperidine] Other (See Comments)    Paralysis. Could only move eyes.  . Meperidine Hcl Other (See Comments)    "CAN'T MOVE" CATATONIC PER PT.  [name] Opium Other (See Comments)    "CAN'T MOVE" CATATONIC PER PT.    Patient Measurements: Height: 6' (182.9 cm) Weight: 192 lb 8 oz (87.317 kg) IBW/kg (Calculated) : 77.6  Vital Signs: Temp: 98.4 F (36.9 C) (11/17 0735) Temp Source: Oral (11/17 0735) Pulse Rate: 75 (11/17 0735)  Labs:  Recent Labs  09/19/14 0251 09/20/14 0212 09/21/14 0327  HGB 8.7* 8.4* 7.9*  HCT 26.8* 25.8* 25.3*  PLT 221 207 189  LABPROT 23.3* 21.3* 21.1*  INR 2.05* 1.83* 1.80*  CREATININE 2.54* 2.48* 2.14*    Estimated Creatinine Clearance: 33.2 mL/min (by C-G formula based on Cr of 2.14).  Assessment: 72yom s/p LVAD (10/19/13) continues on Coumadin. INR was supratherapeutic on admission - doses held since 11/10. Today's INR has continued to trend down, is now slightly below goal. Patient will have a ureteroscopy on today. Hgb has trended down slightly to 7.9, PLTC trending down as well. Patient reports bloody urine after urology procedure yesterday.  Home dose: 2mg  daily except 4mg  Mon/Fri  Goal of Therapy:  INR 2-3 Monitor platelets by anticoagulation protocol: Yes   Plan:  1) Coumadin 4 mg PO x 1 tonight 2) INR in AM 3) Continue to monitor CBC and s/s bleeding  Uvaldo Rising, BCPS  Clinical Pharmacist Pager [phone]  09/21/2014 7:48 AM

## 2014-09-21 NOTE — Telephone Encounter (Signed)
Informed that I would discuss with Dr. Caryl Comes tomorrow when he returns to the office. Pt's wife tells me possible d/c tomorrow, but doesn't want to have to come back into [facility] the next day for office visit.  Advised would call with recommendations.

## 2014-09-21 NOTE — Progress Notes (Signed)
HeartMate 2 Rounding Note  Subjective:     [name] is a 73 yo male with h/o VT, PAD and severe systolic HF (EF 82-99%) due to mixed ischemic/nonischemic CM he is s/p HM II LVAD implant for destination therapy on October 19, 2013. On 09/01/14 underwent cystoscopy and flexible ureteroscopy with lithotripsy and basket extraction.   Events: 09/01/14 cystoscopy and flexible ureteroscopy with lithotripsy and basket extraction.  09/09/10 CT ab/pelvis: interval fragmentation of L renal pelvis calculus, one fragment is lodged in mid ureter with mild hydronephrosis prox to calculus. Admitted 11/9 with SOB fatigue Echo (11/10) RV looks good.  11/10 Renal US, mild L hydronephrosis, bilateral renal cysts.  Having hematuria this am. Overall feeling ok. Creatinine stable ~2.14 INR 1.80  MAPs 80s  LVAD INTERROGATION:  HeartMate II LVAD:  Flow 5.1 liters/min, speed 9200, power 4.8, PI 6.9 Multiple PI events  Objective:    Vital Signs:   Temp:  [97.2 F (36.2 C)-98.7 F (37.1 C)] 98.4 F (36.9 C) (11/17 0735) Pulse Rate:  [74-75] 75 (11/17 0735) Resp:  [10-16] 10 (11/16 1615) BP: (94-109)/(77-90) 94/77 mmHg (11/16 1615) SpO2:  [96 %-100 %] 97 % (11/17 0735) Arterial Line BP: (94-109)/(76-90) 94/77 mmHg (11/16 1615) Weight:  [192 lb 8 oz (87.317 kg)] 192 lb 8 oz (87.317 kg) (11/17 0338) Last BM Date: 09/19/14 Mean arterial Pressure 80s  Intake/Output:   Intake/Output Summary (Last 24 hours) at 09/21/14 0739 Last data filed at 09/20/14 2000  Gross per 24 hour  Intake   1830 ml  Output   1250 ml  Net    580 ml     Physical Exam: MAP 92 General:  Well appearing but. No resp difficulty, sitting in chair HEENT: normal Neck: supple. JVP 8-9, Carotids 2+ bilat; no bruits. No lymphadenopathy or thryomegaly appreciated. Cor: Mechanical heart sounds with LVAD hum present. Lungs: clear Abdomen: soft, nontender, nondistended. No hepatosplenomegaly. No bruits or masses. Good bowel  sounds. Driveline: C/D/I; securement device intact and driveline incorporated Extremities: no cyanosis, clubbing, rash, tr edema Neuro: alert & orientedx3, cranial nerves grossly intact. moves all 4 extremities w/o difficulty. Affect pleasant  Telemetry: a-paced 70s  Labs: Basic Metabolic Panel:  Recent Labs Lab 09/17/14 0215 09/18/14 0345 09/19/14 0251 09/20/14 0212 09/21/14 0327  NA 140 138 137 142 138  K 5.2 4.7 4.5 4.7 4.7  CL 103 102 102 105 103  CO2 25 24 24 24 23   GLUCOSE 100* 105* 105* 100* 105*  BUN 29* 31* 31* 35* 31*  CREATININE 2.39* 2.61* 2.54* 2.48* 2.14*  CALCIUM 8.9 8.8 8.9 8.7 8.6    Liver Function Tests: No results for input(s): AST, ALT, ALKPHOS, BILITOT, PROT, ALBUMIN in the last 168 hours. No results for input(s): LIPASE, AMYLASE in the last 168 hours. No results for input(s): AMMONIA in the last 168 hours.  CBC:  Recent Labs Lab 09/17/14 0215 09/18/14 0345 09/19/14 0251 09/20/14 0212 09/21/14 0327  WBC 6.1 6.1 4.9 4.5 5.8  HGB 9.3* 8.9* 8.7* 8.4* 7.9*  HCT 28.6* 27.2* 26.8* 25.8* 25.3*  MCV 97.3 98.2 97.1 98.1 96.6  PLT 273 257 221 207 189    INR:  Recent Labs Lab 09/17/14 0215 09/18/14 0345 09/19/14 0251 09/20/14 0212 09/21/14 0327  INR 3.00* 2.48* 2.05* 1.83* 1.80*    Other results:    Imaging: Dg Chest 2 View  09/20/2014   CLINICAL DATA:  Ventricular assist device placement  EXAM: CHEST  2 VIEW  COMPARISON:  09/13/2014  FINDINGS:  The ventricular assist device is stable in position. Support wires and catheters remain in place without change. Vascular congestion has resolved. Tiny left pleural effusion persists. There is no pneumothorax.  IMPRESSION: Resolved of vascular congestion.  Tiny left pleural effusion persists.  No evidence of pneumothorax.   Electronically Signed   By: Maryclare Bean M.D.   On: 09/20/2014 09:07   Dg C-arm 61-120 Min-no Report  09/20/2014   CLINICAL DATA: LEFT URETERAL STONES   C-ARM 61-120 MINUTES   Fluoroscopy was utilized by the requesting physician.  No radiographic  interpretation.      Medications:     Scheduled Medications: . amiodarone  200 mg Oral BID  . amLODipine  10 mg Oral Daily  . aspirin EC  325 mg Oral Daily  . atorvastatin  80 mg Oral q1800  . cephALEXin  250 mg Oral Daily  . docusate sodium  200 mg Oral Daily  . ferrous gluconate  324 mg Oral TID WC  . hydrALAZINE  25 mg Oral TID  . levothyroxine  125 mcg Oral QAC breakfast  . pantoprazole  40 mg Oral Daily  . ranolazine  500 mg Oral BID  . Warfarin - Pharmacist Dosing Inpatient   Does not apply q1800    Infusions: . lactated ringers      PRN Medications: acetaminophen **OR** acetaminophen, HYDROmorphone (DILAUDID) injection   Assessment:   1) A/C HF - s/p LVAD 10/2014 2) Paroxysmal Afib/Aflutter - s/p DC-CV 06/04/14; in NSR 3) HTN 4) LVAD 5) Acute on chronic renal failure stage III 6) Hematuria 7) Acute on chronic blood loss anemia 8) s/p Renal stone 9) S/P Ureteroscope wit stent placement   Plan/Discussion:    Volume status stable. Creatinine trending down 2.4>2.1    Had stent placed yesterday. On 250 mg keflex daily per urology. Plan for stone fragment extraction today.  Hgb down from 8.4>7.9. Check CBC in am. On coumadin aspirin. INR 1.80 . Pharmacy dosing coumadin.   MAPs better controlled 80s, will continue current medications. No ACE-I or Arlyce Harman with A/CKD.   Continue to ambulate with CR.   I reviewed the LVAD parameters from today, and compared the results to the patient's prior recorded data.  No programming changes were made.  The LVAD is functioning within specified parameters.  The patient performs LVAD self-test daily.  LVAD interrogation was negative for any significant power changes, alarms or PI events/speed drops.  LVAD equipment check completed and is in good working order.  Back-up equipment present.   LVAD education done on emergency procedures and precautions and reviewed  exit site care.  Length of Stay: 8  CLEGG,AMY NP-C  09/21/2014, 7:39 AM  VAD Team --- VAD ISSUES ONLY--- Pager [phone] (7am - 7am)  Advanced Heart Failure Team  Pager [phone] (M-F; 7a - 4p)  Please contact [facility] for night-coverage after hours (4p -7a ) and weekends on [email or web address]   Patient seen and examined with Darrick Grinder, NP. We discussed all aspects of the encounter. I agree with the assessment and plan as stated above.   Successful stone extraction yesterday. Renal function improved. Continues with symptomatic anemia. Will transfuse 2 units of RBCs and give IV lasix. Hopefully home tomorrow. VAD parameters stable. Give coumadin today.  Dajahnae Vondra,MD 10:51 AM

## 2014-09-21 NOTE — Telephone Encounter (Signed)
[name] siginifant other [phone] calling re pt having an appt with Caryl Comes 09-23-14 but has been in hospital over a week, can he just see Caryl Comes while there and cxl appt here???

## 2014-09-21 NOTE — Op Note (Signed)
Preoperative diagnosis:left ureteral stone fragments Postoperative diagnosis:same  Procedure:cystoscopy, left retrograde pyelogram, rigid and flexible ureteroscopy, basketing of multiple stone fragments, left double-J stent insertion   Surgeon: Bernestine Amass M.D.  Anesthesia: Gen.  Indications:Mr. Palau has a very complicated history including severe congestive heart failure with LVAD. He remains on constant anticoagulation. He underwent laser lithotripsy for a large and dense stone in his left renal pelvis. Subsequently he was noted to have some ongoing hematuria but no flank or abdominal pain. Ultrasound revealed mild hydronephrosis and he has had an increasing creatinine. Repeat CT showed multiple fragments in the ureter as well as a stone/fragment in the lower pole of his left kidney. Because of ongoing hematuria as well as his increased renal insufficiency he is undergoing an additional procedure to try to render him free of the stone fragments.     Technique and findings:patient was brought the operating room where he had successful induction general anesthesia. He was placed in lithotomy position. Appropriate surgical timeout was performed. On cystoscopy there was some trilobar hyperplasia. Multiple small stone fragments were noted within the bladder. The bladder showed no other pathology. Retrograde pyelogram was done with an open-ended catheter and fluoroscopic interpretation. There were multiple filling defects in the proximal left ureter but no evidence of high-grade obstruction.  A guidewire was placed to the left renal pelvis. The ureter was engaged with a rigid ureteroscope. Several small fragments in the ureter were basket extracted. We then placed a ureteral sheath and used a flexible ureteroscope to go up into the renal pelvis. There within the pelvis and calyceal system approximate 5-6 additional stone fragments were basket extracted. At the completion of the procedure a 7 French 24  cm double-J stent was placed. No obvious complications or problems occurred he was brought to recovery room stable condition.

## 2014-09-22 ENCOUNTER — Encounter (HOSPITAL_COMMUNITY): Payer: Self-pay | Admitting: Anesthesiology

## 2014-09-22 LAB — BASIC METABOLIC PANEL
Anion gap: 12 (ref 5–15)
BUN: 31 mg/dL — AB (ref 6–23)
CALCIUM: 8.8 mg/dL (ref 8.4–10.5)
CO2: 23 mEq/L (ref 19–32)
Chloride: 102 mEq/L (ref 96–112)
Creatinine, Ser: 2.06 mg/dL — ABNORMAL HIGH (ref 0.50–1.35)
GFR, EST AFRICAN AMERICAN: 35 mL/min — AB (ref >90)
GFR, EST NON AFRICAN AMERICAN: 30 mL/min — AB (ref >90)
Glucose, Bld: 100 mg/dL — ABNORMAL HIGH (ref 70–99)
POTASSIUM: 5 meq/L (ref 3.7–5.3)
Sodium: 137 mEq/L (ref 137–147)

## 2014-09-22 LAB — LACTATE DEHYDROGENASE: LDH: 293 U/L — ABNORMAL HIGH (ref 94–250)

## 2014-09-22 LAB — CBC
HEMATOCRIT: 31 % — AB (ref 39.0–52.0)
Hemoglobin: 10.1 g/dL — ABNORMAL LOW (ref 13.0–17.0)
MCH: 31.1 pg (ref 26.0–34.0)
MCHC: 32.6 g/dL (ref 30.0–36.0)
MCV: 95.4 fL (ref 78.0–100.0)
PLATELETS: 166 10*3/uL (ref 150–400)
RBC: 3.25 MIL/uL — ABNORMAL LOW (ref 4.22–5.81)
RDW: 17.4 % — ABNORMAL HIGH (ref 11.5–15.5)
WBC: 5.5 10*3/uL (ref 4.0–10.5)

## 2014-09-22 LAB — PROTIME-INR
INR: 1.94 — ABNORMAL HIGH (ref 0.00–1.49)
Prothrombin Time: 22.3 seconds — ABNORMAL HIGH (ref 11.6–15.2)

## 2014-09-22 MED ORDER — WARFARIN SODIUM 4 MG PO TABS
4.0000 mg | ORAL_TABLET | Freq: Once | ORAL | Status: AC
  Administered 2014-09-22: 4 mg via ORAL
  Filled 2014-09-22: qty 1

## 2014-09-22 NOTE — Progress Notes (Signed)
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 On 09/01/14 underwent cystoscopy and flexible ureteroscopy with lithotripsy and basket extraction.   Events: 09/01/14 cystoscopy and flexible ureteroscopy with lithotripsy and basket extraction.  09/09/10 CT ab/pelvis: interval fragmentation of L renal pelvis calculus, one fragment is lodged in mid ureter with mild hydronephrosis prox to calculus. Admitted 11/9 with SOB fatigue Echo (11/10) RV looks good.  11/10 Renal US, mild L hydronephrosis, bilateral renal cysts. 11/16 underwent stone fragment extraction  Received 2 PRBCs yesterday and Hgb 10.1. Still having hematuria. Denies SOB, orthopnea or CP.   MAPs 80-90s  LVAD INTERROGATION:  HeartMate II LVAD:  Flow 4.8 liters/min, speed 9200, power 5.3, PI 6.0 Few PI events  Objective:    Vital Signs:   Temp:  [97.7 F (36.5 C)-98.6 F (37 C)] 97.7 F (36.5 C) (11/18 0736) Pulse Rate:  [74-75] 74 (11/18 0736) Resp:  [16-18] 18 (11/17 2000) SpO2:  [96 %-99 %] 97 % (11/18 0736) Weight:  [190 lb 0.6 oz (86.2 kg)] 190 lb 0.6 oz (86.2 kg) (11/18 0500) Last BM Date: 09/19/14 Mean arterial Pressure 80-90s  Intake/Output:   Intake/Output Summary (Last 24 hours) at 09/22/14 0804 Last data filed at 09/22/14 0755  Gross per 24 hour  Intake    745 ml  Output   2725 ml  Net  -1980 ml     Physical Exam:  General:  Well appearing but. No resp difficulty, sitting in chair HEENT: normal Neck: supple. JVP 8, Carotids 2+ bilat; no bruits. No lymphadenopathy or thryomegaly appreciated. Cor: Mechanical heart sounds with LVAD hum present. Lungs: clear Abdomen: soft, nontender, nondistended. No hepatosplenomegaly. No bruits or masses. Good bowel sounds. Driveline: C/D/I; securement device intact and driveline  incorporated Extremities: no cyanosis, clubbing, rash, tr edema Neuro: alert & orientedx3, cranial nerves grossly intact. moves all 4 extremities w/o difficulty. Affect pleasant  Telemetry: a-paced 70s  Labs: Basic Metabolic Panel:  Recent Labs Lab 09/18/14 0345 09/19/14 0251 09/20/14 0212 09/21/14 0327 09/22/14 0344  NA 138 137 142 138 137  K 4.7 4.5 4.7 4.7 5.0  CL 102 102 105 103 102  CO2 24 24 24 23 23   GLUCOSE 105* 105* 100* 105* 100*  BUN 31* 31* 35* 31* 31*  CREATININE 2.61* 2.54* 2.48* 2.14* 2.06*  CALCIUM 8.8 8.9 8.7 8.6 8.8    Liver Function Tests: No results for input(s): AST, ALT, ALKPHOS, BILITOT, PROT, ALBUMIN in the last 168 hours. No results for input(s): LIPASE, AMYLASE in the last 168 hours. No results for input(s): AMMONIA in the last 168 hours.  CBC:  Recent Labs Lab 09/18/14 0345 09/19/14 0251 09/20/14 0212 09/21/14 0327 09/22/14 0344  WBC 6.1 4.9 4.5 5.8 5.5  HGB 8.9* 8.7* 8.4* 7.9* 10.1*  HCT 27.2* 26.8* 25.8* 25.3* 31.0*  MCV 98.2 97.1 98.1 96.6 95.4  PLT 257 221 207 189 166    INR:  Recent Labs Lab 09/18/14 0345 09/19/14 0251 09/20/14 0212 09/21/14 0327 09/22/14 0344  INR 2.48* 2.05* 1.83* 1.80* 1.94*    Other results:    Imaging: Dg Chest 2 View  09/20/2014   CLINICAL DATA:  Ventricular assist device placement  EXAM: CHEST  2 VIEW  COMPARISON:  09/13/2014  FINDINGS: The ventricular assist device is stable in position.  Support wires and catheters remain in place without change. Vascular congestion has resolved. Tiny left pleural effusion persists. There is no pneumothorax.  IMPRESSION: Resolved of vascular congestion.  Tiny left pleural effusion persists.  No evidence of pneumothorax.   Electronically Signed   By: Maryclare Bean M.D.   On: 09/20/2014 09:07   Dg C-arm 61-120 Min-no Report  09/20/2014   CLINICAL DATA: LEFT URETERAL STONES   C-ARM 61-120 MINUTES  Fluoroscopy was utilized by the requesting physician.  No  radiographic  interpretation.      Medications:     Scheduled Medications: . amiodarone  200 mg Oral BID  . amLODipine  10 mg Oral Daily  . aspirin EC  325 mg Oral Daily  . atorvastatin  80 mg Oral q1800  . cephALEXin  250 mg Oral Daily  . docusate sodium  200 mg Oral Daily  . ferrous gluconate  324 mg Oral TID WC  . hydrALAZINE  25 mg Oral TID  . levothyroxine  125 mcg Oral QAC breakfast  . pantoprazole  40 mg Oral Daily  . ranolazine  500 mg Oral BID  . Warfarin - Pharmacist Dosing Inpatient   Does not apply q1800    Infusions: . lactated ringers      PRN Medications: acetaminophen **OR** acetaminophen, HYDROmorphone (DILAUDID) injection   Assessment:   1) A/C HF - s/p LVAD 10/2014 2) Paroxysmal Afib/Aflutter - s/p DC-CV 06/04/14; in NSR 3) HTN 4) LVAD 5) Acute on chronic renal failure stage III 6) Hematuria 7) Acute on chronic blood loss anemia 8) s/p Renal stone 9) S/P Ureteroscope wit stent placement   Plan/Discussion:    Stable overnight and volume status stable. Renal function continues to trend down and Creatinine is 2.06.   Had stent placed 09/21/15 and remains on 250 mg keflex daily per urology. Still having hematuria this am and received 2 PRBCs yesterday and Hgb now 10.1. Will continue to follow one more day and if Hgb remains stable then will hopefully send home tomorrow.   INR 1.94, pharmacy dosing coumadin and remains on ASA 325 mg daily.   MAPs 80-90s will continue current medications. No ACE-I or Arlyce Harman with A/CKD.   Continue to ambulate with CR.   I reviewed the LVAD parameters from today, and compared the results to the patient's prior recorded data.  No programming changes were made.  The LVAD is functioning within specified parameters.  The patient performs LVAD self-test daily.  LVAD interrogation was negative for any significant power changes, alarms or PI events/speed drops.  LVAD equipment check completed and is in good working order.   Back-up equipment present.   LVAD education done on emergency procedures and precautions and reviewed exit site care.  Length of Stay: 9  Rande Brunt NP-C  09/22/2014, 8:04 AM  VAD Team --- VAD ISSUES ONLY--- Pager [phone] (7am - 7am)  Advanced Heart Failure Team  Pager [phone] (M-F; 7a - 4p)  Please contact [facility] for night-coverage after hours (4p -7a ) and weekends on [email or web address]  Patient seen and examined with Junie Bame, NP. We discussed all aspects of the encounter. I agree with the assessment and plan as stated above.   He feels better after transfusion. Renal function improving. Still with significant hematuria. INR 1.9. Will watch one more day. Hopefully hematuria will slow. Repeat CBC and INR in am. Hopefully home tomorrow.  VAD parameters stable.   Benay Spice 8:36 AM

## 2014-09-22 NOTE — Progress Notes (Signed)
Anesthesiology Follow-up:  Awake and alert, in good spirits, walking around the 2H unit without assistance.  VS: T-36.5 HR 74 MAP 99 RR-18 O2 Sat 97% oon RA  VAD Parameters: Pump speed: 9200 rpm Flow: 5.3 lpm Power: 5 watts PI: 7.1   Na- 137 k-5.0 BUN/Cr.-31/2.06 H/H-10.1/31.0 Platelets- 166 LDH-293 PT/INR-22.3/1.94  UA- (+) hemoglobin  73 year old male with end-stage nonischemic/ischemic CM S/P Heartmate II LVAD insertion 10/19/13. Underwent cysto with ureteroscopy and basket extraction of stones 09/20/14. Has persistent hematuria but otherwise doing well, received 2 units PRBCs yesterday. Renal function improved, doing well otherwise, will be ready for discharge when hematuria improves.  [name]

## 2014-09-22 NOTE — Plan of Care (Signed)
Problem: Discharge Progression Outcomes Goal: Able to perform self care activities Outcome: Completed/Met Date Met:  09/22/14 Goal: Discharge plan in place and appropriate Outcome: Completed/Met Date Met:  09/22/14 Goal: If EF < 40% ACEI/ARB addressed at discharge Outcome: Completed/Met Date Met:  09/22/14 Goal: Pain controlled with appropriate interventions Outcome: Completed/Met Date Met:  09/22/14 Goal: Hemodynamically stable Outcome: Completed/Met Date Met:  51/61/44 Goal: Complications resolved/controlled Outcome: Completed/Met Date Met:  09/22/14 Goal: Tolerating diet Outcome: Completed/Met Date Met:  09/22/14 Goal: Activity appropriate for discharge plan Outcome: Completed/Met Date Met:  09/22/14 Goal: Other Discharge Outcomes/Goals Outcome: Completed/Met Date Met:  09/22/14

## 2014-09-22 NOTE — Addendum Note (Signed)
Addendum  created 09/22/14 1134 by Roberts Gaudy, MD   Modules edited: Notes Section   Notes Section:  File: [id number]; Pend: [id number]; Pend: [id number]; Pend: [id number]

## 2014-09-23 ENCOUNTER — Encounter: Payer: Medicare Other | Admitting: Internal Medicine

## 2014-09-23 LAB — CBC
HEMATOCRIT: 29.2 % — AB (ref 39.0–52.0)
Hemoglobin: 9.4 g/dL — ABNORMAL LOW (ref 13.0–17.0)
MCH: 29.7 pg (ref 26.0–34.0)
MCHC: 32.2 g/dL (ref 30.0–36.0)
MCV: 92.4 fL (ref 78.0–100.0)
Platelets: 161 10*3/uL (ref 150–400)
RBC: 3.16 MIL/uL — ABNORMAL LOW (ref 4.22–5.81)
RDW: 16.7 % — ABNORMAL HIGH (ref 11.5–15.5)
WBC: 5 10*3/uL (ref 4.0–10.5)

## 2014-09-23 LAB — BASIC METABOLIC PANEL
Anion gap: 12 (ref 5–15)
BUN: 32 mg/dL — ABNORMAL HIGH (ref 6–23)
CALCIUM: 8.6 mg/dL (ref 8.4–10.5)
CO2: 24 mEq/L (ref 19–32)
CREATININE: 1.9 mg/dL — AB (ref 0.50–1.35)
Chloride: 102 mEq/L (ref 96–112)
GFR calc Af Amer: 38 mL/min — ABNORMAL LOW (ref >90)
GFR calc non Af Amer: 33 mL/min — ABNORMAL LOW (ref >90)
GLUCOSE: 94 mg/dL (ref 70–99)
Potassium: 4.3 mEq/L (ref 3.7–5.3)
SODIUM: 138 meq/L (ref 137–147)

## 2014-09-23 LAB — PREPARE RBC (CROSSMATCH)

## 2014-09-23 LAB — LACTATE DEHYDROGENASE: LDH: 234 U/L (ref 94–250)

## 2014-09-23 LAB — PROTIME-INR
INR: 2.17 — AB (ref 0.00–1.49)
Prothrombin Time: 24.3 seconds — ABNORMAL HIGH (ref 11.6–15.2)

## 2014-09-23 MED ORDER — FUROSEMIDE 10 MG/ML IJ SOLN
40.0000 mg | Freq: Once | INTRAMUSCULAR | Status: AC
  Administered 2014-09-23: 40 mg via INTRAVENOUS
  Filled 2014-09-23: qty 4

## 2014-09-23 MED ORDER — WARFARIN SODIUM 4 MG PO TABS
4.0000 mg | ORAL_TABLET | Freq: Once | ORAL | Status: DC
  Filled 2014-09-23: qty 1

## 2014-09-23 MED ORDER — HYDRALAZINE HCL 25 MG PO TABS: 25.0000 mg | ORAL_TABLET | Freq: Three times a day (TID) | ORAL | Status: DC

## 2014-09-23 MED ORDER — SODIUM CHLORIDE 0.9 % IV SOLN
Freq: Once | INTRAVENOUS | Status: AC
  Administered 2014-09-23: 10:00:00 via INTRAVENOUS

## 2014-09-23 NOTE — Plan of Care (Signed)
Problem: Discharge Progression Outcomes Goal: Hemodynamically stable Outcome: Completed/Met Date Met:  09/23/14     

## 2014-09-23 NOTE — Plan of Care (Signed)
Problem: Discharge Progression Outcomes Goal: Pain controlled with appropriate interventions Outcome: Not Applicable Date Met:  50/87/19

## 2014-09-23 NOTE — Plan of Care (Signed)
Problem: Discharge Progression Outcomes Goal: Tolerating diet Outcome: Completed/Met Date Met:  09/23/14

## 2014-09-23 NOTE — Progress Notes (Signed)
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 On 09/01/14 underwent cystoscopy and flexible ureteroscopy with lithotripsy and basket extraction.   Events: 09/01/14 cystoscopy and flexible ureteroscopy with lithotripsy and basket extraction.  09/09/10 CT ab/pelvis: interval fragmentation of L renal pelvis calculus, one fragment is lodged in mid ureter with mild hydronephrosis prox to calculus. Admitted 11/9 with SOB fatigue Echo (11/10) RV looks good.  11/10 Renal US, mild L hydronephrosis, bilateral renal cysts. 11/16 underwent stone fragment extraction  Received 2 PRBCs 09/21/14 Hgb 10.>9.4 . Still having hematuria. Denies SOB, orthopnea or CP.   MAPs 80-90s  LVAD INTERROGATION:  HeartMate II LVAD:  Flow 4.8 liters/min, speed 9200, power 5.1, PI 5.9 Objective:    Vital Signs:   Temp:  [97.7 F (36.5 C)-98.4 F (36.9 C)] 98.3 F (36.8 C) (11/19 0400) Pulse Rate:  [74-80] 80 (11/19 0400) Resp:  [15-16] 15 (11/18 1704) SpO2:  [96 %-98 %] 96 % (11/19 0400) Weight:  [188 lb 4.4 oz (85.4 kg)] 188 lb 4.4 oz (85.4 kg) (11/19 0500) Last BM Date: 09/19/14 Mean arterial Pressure 80-90s  Intake/Output:   Intake/Output Summary (Last 24 hours) at 09/23/14 0658 Last data filed at 09/23/14 0600  Gross per 24 hour  Intake    500 ml  Output   3400 ml  Net  -2900 ml     Physical Exam:  General:  Well appearing but. No resp difficulty, sitting in chair HEENT: normal Neck: supple. JVP 8, Carotids 2+ bilat; no bruits. No lymphadenopathy or thryomegaly appreciated. Cor: Mechanical heart sounds with LVAD hum present. Lungs: clear Abdomen: soft, nontender, nondistended. No hepatosplenomegaly. No bruits or masses. Good bowel sounds. Driveline: C/D/I; securement device intact and driveline  incorporated Extremities: no cyanosis, clubbing, rash, tr edema Neuro: alert & orientedx3, cranial nerves grossly intact. moves all 4 extremities w/o difficulty. Affect pleasant  Telemetry: a-paced 70s  Labs: Basic Metabolic Panel:  Recent Labs Lab 09/19/14 0251 09/20/14 0212 09/21/14 0327 09/22/14 0344 09/23/14 0245  NA 137 142 138 137 138  K 4.5 4.7 4.7 5.0 4.3  CL 102 105 103 102 102  CO2 24 24 23 23 24   GLUCOSE 105* 100* 105* 100* 94  BUN 31* 35* 31* 31* 32*  CREATININE 2.54* 2.48* 2.14* 2.06* 1.90*  CALCIUM 8.9 8.7 8.6 8.8 8.6    Liver Function Tests: No results for input(s): AST, ALT, ALKPHOS, BILITOT, PROT, ALBUMIN in the last 168 hours. No results for input(s): LIPASE, AMYLASE in the last 168 hours. No results for input(s): AMMONIA in the last 168 hours.  CBC:  Recent Labs Lab 09/19/14 0251 09/20/14 0212 09/21/14 0327 09/22/14 0344 09/23/14 0245  WBC 4.9 4.5 5.8 5.5 5.0  HGB 8.7* 8.4* 7.9* 10.1* 9.4*  HCT 26.8* 25.8* 25.3* 31.0* 29.2*  MCV 97.1 98.1 96.6 95.4 92.4  PLT 221 207 189 166 161    INR:  Recent Labs Lab 09/19/14 0251 09/20/14 0212 09/21/14 0327 09/22/14 0344 09/23/14 0245  INR 2.05* 1.83* 1.80* 1.94* 2.17*    Other results:    Imaging: No results found.   Medications:     Scheduled Medications: . amiodarone  200 mg Oral BID  . amLODipine  10 mg Oral Daily  . aspirin EC  325 mg Oral Daily  . atorvastatin  80 mg Oral q1800  . cephALEXin  250 mg Oral Daily  . docusate sodium  200 mg Oral Daily  . ferrous gluconate  324 mg Oral TID WC  . hydrALAZINE  25 mg Oral TID  . levothyroxine  125 mcg Oral QAC breakfast  . pantoprazole  40 mg Oral Daily  . ranolazine  500 mg Oral BID  . Warfarin - Pharmacist Dosing Inpatient   Does not apply q1800    Infusions: . lactated ringers      PRN Medications: acetaminophen **OR** acetaminophen, HYDROmorphone (DILAUDID) injection   Assessment:   1) A/C HF - s/p LVAD 10/2014 2)  Paroxysmal Afib/Aflutter - s/p DC-CV 06/04/14; in NSR 3) HTN 4) LVAD 5) Acute on chronic renal failure stage III 6) Hematuria 7) Acute on chronic blood loss anemia 8) s/p Renal stone 9) S/P Ureteroscope wit stent placement   Plan/Discussion:    Stable overnight and volume status stable. Renal function continues to trend down.  Today creaatinine is 1.90.   Had stent placed 09/21/15 and remains on 250 mg keflex daily per urology.  Received 2UPRBCs 09/21/14 Still having hematuria. Hgb down 10.1>9.4 .    INR 2.1, pharmacy dosing coumadin and remains on ASA 325 mg daily.   MAPs 80-90s will continue current medications. No ACE-I or Arlyce Harman with A/CKD.   Continue to ambulate with CR.   I reviewed the LVAD parameters from today, and compared the results to the patient's prior recorded data.  No programming changes were made.  The LVAD is functioning within specified parameters.  The patient performs LVAD self-test daily.  LVAD interrogation was negative for any significant power changes, alarms or PI events/speed drops.  LVAD equipment check completed and is in good working order.  Back-up equipment present.   LVAD education done on emergency procedures and precautions and reviewed exit site care.  Length of Stay: 10  CLEGG,AMY NP-C  09/23/2014, 6:58 AM  VAD Team --- VAD ISSUES ONLY--- Pager [phone] (7am - 7am)  Advanced Heart Failure Team  Pager [phone] (M-F; 7a - 4p)  Please contact [facility] for night-coverage after hours (4p -7a ) and weekends on [email or web address]   Patient seen and examined with Darrick Grinder, NP. We discussed all aspects of the encounter. I agree with the assessment and plan as stated above.   Looks good. Ready for d/c today. Would give 1 more unit RBCs prior to d/c given ongoing hematuria and mild drop in hgb.   Will see next week in clinic with labs.VAD parameters ok.   [name] 6:46 PM

## 2014-09-23 NOTE — Progress Notes (Signed)
ANTICOAGULATION CONSULT NOTE - Follow Up Consult  Pharmacy Consult for Coumadin Indication: LVAD  Allergies  Allergen Reactions  . Demerol [Meperidine] Other (See Comments)    Paralysis. Could only move eyes.  . Meperidine Hcl Other (See Comments)    "CAN'T MOVE" CATATONIC PER PT.  [name] Opium Other (See Comments)    "CAN'T MOVE" CATATONIC PER PT.    Patient Measurements: Height: 6' (182.9 cm) Weight: 188 lb 4.4 oz (85.4 kg) IBW/kg (Calculated) : 77.6  Vital Signs: Temp: 97.4 F (36.3 C) (11/19 1400) Temp Source: Oral (11/19 1400) Pulse Rate: 75 (11/19 1400)  Labs:  Recent Labs  09/21/14 0327 09/22/14 0344 09/23/14 0245  HGB 7.9* 10.1* 9.4*  HCT 25.3* 31.0* 29.2*  PLT 189 166 161  LABPROT 21.1* 22.3* 24.3*  INR 1.80* 1.94* 2.17*  CREATININE 2.14* 2.06* 1.90*    Estimated Creatinine Clearance: 37.4 mL/min (by C-G formula based on Cr of 1.9).  Assessment: 72yom s/p LVAD (10/19/13) continues on Coumadin. INR was supratherapeutic on admission - doses held since 11/10. Today's INR up to 2.1. Continues to have hematuria. Hgb has trended down slightly to 9.4 will transfuse again today, discharge and recheck labs next week, PLTC trending down as well.  Home dose: 2mg  daily except 4mg  Mon/Fri  Goal of Therapy:  INR 2-3 Monitor platelets by anticoagulation protocol: Yes   Plan:  1) Coumadin 4 mg PO x 1 tonight-resume home dose at discharge 2) Continue to monitor CBC and s/s bleeding  Erin Hearing PharmD., BCPS Clinical Pharmacist Pager [phone] 09/23/2014 2:47 PM

## 2014-09-23 NOTE — Plan of Care (Signed)
Problem: Discharge Progression Outcomes Goal: Discharge plan in place and appropriate Outcome: Completed/Met Date Met:  09/23/14

## 2014-09-23 NOTE — Discharge Summary (Signed)
Advanced Heart Failure Team  Discharge Summary   Patient ID: [name] MRN: [id number], DOB/AGE: 14-Jun-1941 73 y.o. Admit date: 09/13/2014 D/C date:     09/23/2014   PCP: Dr. Karolee Stamps ([place])  Primary Discharge Diagnoses:  1) Hematuria 2) Chronic nephrolithiasis - s/p ureteroscope with stent placement 3) A/C renal failure stage III  Secondary Discharge Diagnoses:  1) A/Chronic systolic HF - s/p LVAD 50/0938 2) Afib/Aflutter - s/p DC-CV 06/04/14 - in NSR on d/c 3) LVAD therapy 4) HTN 5) Acute blood loss anemia  Hospital Course:  [name] is a 73 yo male with h/o VT, PAD and severe systolic HF (EF 18-29%) due to mixed ischemic/nonischemic CM he is s/p HM II LVAD implant for destination therapy on October 19, 2013. On 09/01/14 underwent cystoscopy and flexible ureteroscopy with lithotripsy and basket extraction.  He presented to the ED on 09/13/14 with increased SOB, LE edema and hematuria. He denied any fevers or chills. Pertinent labs on admission were lactic acid 0.9, INR 2.57, pro-BNP 5152, Troponin <0.30, LDH 272, Creatinine 2.01, Hgb 8.6 and WBC 5.3. He was admitted for volume overload and concerns of RV failure. He was started on diuretics and his volume status improved but his renal function started trending up. There was concern for either RV failure with his renal function trending up or if he was having obstructive uropathy.  He underwent an Echo to assess the RV which showed that the RV looked good and then underwent a renal U/S which showed mild L hydronephrosis and bilateral renal cysts. Dr. Risa Grill with urology who had performed his previous procedures was consulted and the decision was to move forward and repeat lithotripsy (stone fragment extraction) to see if the renal stones could be broken up and passed since his renal function remained elevated and he was still having significant amount of hematuria.   He underwent stone extraction by ureteroscopy  and placement of double J stent on 09/20/14 at [facility]. At the time of first attempt of  The VAD Coordinator was present during the procedure as well as the cardiac anesthesiologist. He tolerated the procedure well and a few fragments of the stone were extracted. After the procedure his renal function started to trend down, however unfortunately his Hgb did too and needed a total of 3 PRBCs. His renal function began to trend down and his volume status remained stable on PO diuretics. On day of discharge his VSS and VAD parameters were normal for the patient with no alarms or PI events. He was still having hematuria which was expected from procedure and will follow up with urologist next week. He will have repeat CBC checked next Monday.    LVAD INTERROGATION:  HeartMate II LVAD: Flow 4.8 liters/min, speed 9200, power 5.1, PI 5.9    Discharge Weight Range: 188-191 lbs.  Discharge Vitals: Blood pressure 94/77, pulse 75, temperature 97.3 F (36.3 C), temperature source Axillary, resp. rate 18, height 6' (1.829 m), weight 188 lb 4.4 oz (85.4 kg), SpO2 96 %.  Labs: Lab Results  Component Value Date   WBC 5.0 09/23/2014   HGB 9.4* 09/23/2014   HCT 29.2* 09/23/2014   MCV 92.4 09/23/2014   PLT 161 09/23/2014    Recent Labs Lab 09/23/14 0245  NA 138  K 4.3  CL 102  CO2 24  BUN 32*  CREATININE 1.90*  CALCIUM 8.6  GLUCOSE 94   Lab Results  Component Value Date   CHOL 118 11/28/2012  HDL 50.80 11/28/2012   LDLCALC 58 11/28/2012   TRIG 48.0 11/28/2012   BNP (last 3 results)  Recent Labs  07/29/14 0957 09/09/14 1038 09/13/14 1024  PROBNP 1468.0* 5067.0* 5152.0*    Diagnostic Studies/Procedures   No results found.  Discharge Medications     Medication List    ASK your doctor about these medications        amiodarone 200 MG tablet  Commonly known as:  PACERONE  Take 200 mg by mouth 2 (two) times daily.     amLODipine 10 MG tablet  Commonly known as:   NORVASC  Take 10 mg by mouth every evening.     aspirin 81 MG tablet  Take 81 mg by mouth every morning.     atorvastatin 80 MG tablet  Commonly known as:  LIPITOR  Take 80 mg by mouth every morning.     CALCIUM-MAGNESIUM-ZINC PO  Take 3 tablets by mouth every evening. 1000 mg calcium, 400 mg magnesium, 25 mg zinc     cephALEXin 250 MG capsule  Commonly known as:  KEFLEX  Take 1 capsule (250 mg total) by mouth daily.     docusate sodium 100 MG capsule  Commonly known as:  COLACE  Take 200 mg by mouth every morning.     ferrous gluconate 324 MG tablet  Commonly known as:  FERGON  Take 1 tablet (324 mg total) by mouth 3 (three) times daily with meals.     furosemide 20 MG tablet  Commonly known as:  LASIX  Take 20 mg by mouth 2 (two) times a week.     hydrALAZINE 25 MG tablet  Commonly known as:  APRESOLINE  Take 0.5 tablets (12.5 mg total) by mouth 3 (three) times daily.     HYDROcodone-acetaminophen 5-325 MG per tablet  Commonly known as:  NORCO/VICODIN  Take 1-2 tablets by mouth every 6 (six) hours as needed.     ICAPS PO  Take 1 tablet by mouth 2 (two) times daily.     levothyroxine 125 MCG tablet  Commonly known as:  SYNTHROID  Take 1 tablet (125 mcg total) by mouth daily.     multivitamin tablet  Take 1 tablet by mouth at bedtime.     nitroGLYCERIN 0.4 MG SL tablet  Commonly known as:  NITROSTAT  Place 0.4 mg under the tongue every 5 (five) minutes as needed for chest pain. For chest pain     pantoprazole 40 MG tablet  Commonly known as:  PROTONIX  TAKE 1 TABLET (40 MG TOTAL) BY MOUTH DAILY.     ranolazine 500 MG 12 hr tablet  Commonly known as:  RANEXA  Take 500 mg by mouth 2 (two) times daily.     warfarin 2 MG tablet  Commonly known as:  COUMADIN  Take 2-4 mg by mouth at bedtime. Takes 2mg  daily, except takes 4mg  on Mondays and Fridays.        Disposition   The patient will be discharged in stable condition to home.      Follow-up  Information    Follow up with [facility] On 10/05/2014.   Why:  at 1000        Duration of Discharge Encounter: Greater than 35 minutes   Signed, Rande Brunt  09/23/2014, 12:28 PM  Patient seen and examined with Junie Bame, NP. We discussed all aspects of the encounter. I agree with the assessment and plan as stated above. [place] for d/c home today.  Alvia Tory,MD 10:24 PM

## 2014-09-23 NOTE — Plan of Care (Signed)
Problem: Discharge Progression Outcomes Goal: Activity appropriate for discharge plan Outcome: Completed/Met Date Met:  09/23/14

## 2014-09-23 NOTE — Progress Notes (Signed)
Discharged home ambulatory accompanied by wife, discharge instructions given to pt. Belongings taken home.

## 2014-09-24 LAB — TYPE AND SCREEN
ABO/RH(D): O POS
ANTIBODY SCREEN: NEGATIVE
UNIT DIVISION: 0
Unit division: 0
Unit division: 0

## 2014-09-27 ENCOUNTER — Ambulatory Visit (HOSPITAL_COMMUNITY)
Admission: RE | Admit: 2014-09-27 | Discharge: 2014-09-27 | Disposition: A | Discharge status: Home / Self Care | Payer: Medicare Other | Source: Ambulatory Visit | Attending: Internal Medicine | Admitting: Internal Medicine

## 2014-09-27 ENCOUNTER — Ambulatory Visit (HOSPITAL_COMMUNITY): Payer: Self-pay | Admitting: Anesthesiology

## 2014-09-27 ENCOUNTER — Telehealth (HOSPITAL_COMMUNITY): Payer: Self-pay | Admitting: Anesthesiology

## 2014-09-27 DIAGNOSIS — I4892 Unspecified atrial flutter: Secondary | ICD-10-CM | POA: Insufficient documentation

## 2014-09-27 DIAGNOSIS — I2510 Atherosclerotic heart disease of native coronary artery without angina pectoris: Secondary | ICD-10-CM | POA: Insufficient documentation

## 2014-09-27 DIAGNOSIS — R319 Hematuria, unspecified: Secondary | ICD-10-CM | POA: Diagnosis not present

## 2014-09-27 DIAGNOSIS — I255 Ischemic cardiomyopathy: Secondary | ICD-10-CM | POA: Diagnosis not present

## 2014-09-27 DIAGNOSIS — Z79899 Other long term (current) drug therapy: Secondary | ICD-10-CM | POA: Insufficient documentation

## 2014-09-27 DIAGNOSIS — Z8673 Personal history of transient ischemic attack (TIA), and cerebral infarction without residual deficits: Secondary | ICD-10-CM | POA: Insufficient documentation

## 2014-09-27 DIAGNOSIS — I5020 Unspecified systolic (congestive) heart failure: Secondary | ICD-10-CM | POA: Diagnosis present

## 2014-09-27 DIAGNOSIS — Z95828 Presence of other vascular implants and grafts: Secondary | ICD-10-CM | POA: Insufficient documentation

## 2014-09-27 DIAGNOSIS — Z7901 Long term (current) use of anticoagulants: Secondary | ICD-10-CM | POA: Diagnosis not present

## 2014-09-27 DIAGNOSIS — I252 Old myocardial infarction: Secondary | ICD-10-CM | POA: Insufficient documentation

## 2014-09-27 DIAGNOSIS — R0600 Dyspnea, unspecified: Secondary | ICD-10-CM | POA: Insufficient documentation

## 2014-09-27 DIAGNOSIS — N200 Calculus of kidney: Secondary | ICD-10-CM | POA: Insufficient documentation

## 2014-09-27 DIAGNOSIS — I472 Ventricular tachycardia: Secondary | ICD-10-CM | POA: Diagnosis present

## 2014-09-27 DIAGNOSIS — Z95811 Presence of heart assist device: Secondary | ICD-10-CM

## 2014-09-27 DIAGNOSIS — I739 Peripheral vascular disease, unspecified: Secondary | ICD-10-CM | POA: Insufficient documentation

## 2014-09-27 DIAGNOSIS — Z7982 Long term (current) use of aspirin: Secondary | ICD-10-CM | POA: Diagnosis not present

## 2014-09-27 DIAGNOSIS — N183 Chronic kidney disease, stage 3 (moderate): Secondary | ICD-10-CM | POA: Diagnosis not present

## 2014-09-27 DIAGNOSIS — Z87891 Personal history of nicotine dependence: Secondary | ICD-10-CM | POA: Insufficient documentation

## 2014-09-27 DIAGNOSIS — I480 Paroxysmal atrial fibrillation: Secondary | ICD-10-CM | POA: Insufficient documentation

## 2014-09-27 DIAGNOSIS — I679 Cerebrovascular disease, unspecified: Secondary | ICD-10-CM | POA: Insufficient documentation

## 2014-09-27 DIAGNOSIS — Z95810 Presence of automatic (implantable) cardiac defibrillator: Secondary | ICD-10-CM | POA: Diagnosis not present

## 2014-09-27 DIAGNOSIS — I5022 Chronic systolic (congestive) heart failure: Secondary | ICD-10-CM | POA: Diagnosis not present

## 2014-09-27 DIAGNOSIS — D649 Anemia, unspecified: Secondary | ICD-10-CM

## 2014-09-27 DIAGNOSIS — R531 Weakness: Secondary | ICD-10-CM

## 2014-09-27 LAB — TSH: TSH: 21.74 u[IU]/mL — ABNORMAL HIGH (ref 0.350–4.500)

## 2014-09-27 LAB — BASIC METABOLIC PANEL
Anion gap: 14 (ref 5–15)
BUN: 34 mg/dL — ABNORMAL HIGH (ref 6–23)
CO2: 24 mEq/L (ref 19–32)
CREATININE: 1.88 mg/dL — AB (ref 0.50–1.35)
Calcium: 9 mg/dL (ref 8.4–10.5)
Chloride: 101 mEq/L (ref 96–112)
GFR calc Af Amer: 39 mL/min — ABNORMAL LOW (ref >90)
GFR calc non Af Amer: 34 mL/min — ABNORMAL LOW (ref >90)
GLUCOSE: 99 mg/dL (ref 70–99)
Potassium: 4.4 mEq/L (ref 3.7–5.3)
Sodium: 139 mEq/L (ref 137–147)

## 2014-09-27 LAB — PREPARE RBC (CROSSMATCH)

## 2014-09-27 LAB — PROTIME-INR
INR: 2.16 — AB (ref 0.00–1.49)
Prothrombin Time: 24.2 seconds — ABNORMAL HIGH (ref 11.6–15.2)

## 2014-09-27 LAB — CBC
HCT: 30.3 % — ABNORMAL LOW (ref 39.0–52.0)
Hemoglobin: 10.1 g/dL — ABNORMAL LOW (ref 13.0–17.0)
MCH: 31 pg (ref 26.0–34.0)
MCHC: 33.3 g/dL (ref 30.0–36.0)
MCV: 92.9 fL (ref 78.0–100.0)
Platelets: 171 10*3/uL (ref 150–400)
RBC: 3.26 MIL/uL — AB (ref 4.22–5.81)
RDW: 16.7 % — AB (ref 11.5–15.5)
WBC: 6.4 10*3/uL (ref 4.0–10.5)

## 2014-09-27 LAB — T4, FREE: FREE T4: 1.1 ng/dL (ref 0.80–1.80)

## 2014-09-27 MED ORDER — LEVOTHYROXINE SODIUM 150 MCG PO TABS: 150.0000 ug | ORAL_TABLET | Freq: Every day | ORAL | Status: DC

## 2014-09-27 MED ORDER — SODIUM CHLORIDE 0.9 % IV SOLN: Freq: Once | INTRAVENOUS | Status: DC

## 2014-09-27 MED ORDER — FUROSEMIDE 10 MG/ML IJ SOLN: 40.0000 mg | Freq: Once | INTRAMUSCULAR | Status: DC

## 2014-09-27 NOTE — H&P (Signed)
ADVANCED HF TEAM H&P  HPI:    [name] is a 73 yo male with h/o VT, PAD and severe systolic HF (EF 57-84%) due to mixed ischemic/nonischemic CM he is s/p HM II LVAD implant for destination therapy on October 19, 2013. On 09/01/14 underwent cystoscopy and flexible ureteroscopy with lithotripsy and basket extraction.  Readmitted last week with persistent hematuria and underwent stone fragment extraction.   Presented today with recurrent weakness and ongoing hematuria. Hgb 10.1. + DOE and fatigue. No edema. Weight stable. No palpitations. Renal function continues to improve    LVAD INTERROGATION:  HeartMate II LVAD: Flow 5.0 liters/min, speed 9200, power 5.4, PI 6.4  20-40 PI events perday   Review of Systems: [y] = yes, [ ]  = no   General: Weight gain [ ] ; Weight loss [ ] ; Anorexia [ ] ; Fatigue [ y]; Fever [ ] ; Chills [ ] ; Weakness [ ]   Cardiac: Chest pain/pressure [ ] ; Resting SOB [ ] ; Exertional SOB [ ] ; Orthopnea [ ] ; Pedal Edema [ ] ; Palpitations [ ] ; Syncope [ ] ; Presyncope [ ] ; Paroxysmal nocturnal dyspnea[ ]   Pulmonary: Cough [ ] ; Wheezing[ ] ; Hemoptysis[ ] ; Sputum [ ] ; Snoring [ ]   GI: Vomiting[ ] ; Dysphagia[ ] ; Melena[ ] ; Hematochezia [ ] ; Heartburn[ ] ; Abdominal pain [ ] ; Constipation [ ] ; Diarrhea [ ] ; BRBPR [ ]   GU: Hematuria[ y]; Dysuria [ ] ; Nocturia[ ]   Vascular: Pain in legs with walking [ ] ; Pain in feet with lying flat [ ] ; Non-healing sores [ ] ; Stroke [ ] ; TIA [ ] ; Slurred speech [ ] ;  Neuro: Headaches[ ] ; Vertigo[ ] ; Seizures[ ] ; Paresthesias[ ] ;Blurred vision [ ] ; Diplopia [ ] ; Vision changes [ ]   Ortho/Skin: Arthritis [ ] ; Joint pain [ ] ; Muscle pain [ ] ; Joint swelling [ ] ; Back Pain [ ] ; Rash [ ]   Psych: Depression[ ] ; Anxiety[ ]   Heme: Bleeding problems [ ] ; Clotting disorders [ ] ; Anemia [ y]  Endocrine: Diabetes [ ] ; Thyroid dysfunction[ ]   Home Medications Prior to Admission medications   Medication Sig Start Date End Date Taking?  Authorizing Provider  amiodarone (PACERONE) 200 MG tablet Take 200 mg by mouth 2 (two) times daily. 06/03/14   Jolaine Artist, MD  amLODipine (NORVASC) 10 MG tablet Take 10 mg by mouth every evening.    Historical Provider, MD  aspirin 81 MG tablet Take 81 mg by mouth every morning.     Historical Provider, MD  atorvastatin (LIPITOR) 80 MG tablet Take 80 mg by mouth every morning.    Historical Provider, MD  CALCIUM-MAGNESIUM-ZINC PO Take 3 tablets by mouth every evening. 1000 mg calcium, 400 mg magnesium, 25 mg zinc    Historical Provider, MD  cephALEXin (KEFLEX) 250 MG capsule Take 1 capsule (250 mg total) by mouth daily. 09/09/14   Jolaine Artist, MD  docusate sodium (COLACE) 100 MG capsule Take 200 mg by mouth every morning.    Historical Provider, MD  ferrous gluconate (FERGON) 324 MG tablet Take 1 tablet (324 mg total) by mouth 3 (three) times daily with meals. 06/07/14   Jolaine Artist, MD  furosemide (LASIX) 20 MG tablet Take 20 mg by mouth 2 (two) times a week.     Historical Provider, MD  hydrALAZINE (APRESOLINE) 25 MG tablet Take 1 tablet (25 mg total) by mouth 3 (three) times daily. 09/23/14   Amy D Ninfa Meeker, NP  HYDROcodone-acetaminophen (NORCO/VICODIN) 5-325 MG per tablet Take 1-2 tablets by  mouth every 6 (six) hours as needed. 08/09/14   Bernestine Amass, MD  levothyroxine (SYNTHROID) 125 MCG tablet Take 1 tablet (125 mcg total) by mouth daily. 04/01/14   Deboraha Sprang, MD  Multiple Vitamin (MULTIVITAMIN) tablet Take 1 tablet by mouth at bedtime.     Historical Provider, MD  Multiple Vitamins-Minerals (ICAPS PO) Take 1 tablet by mouth 2 (two) times daily.    Historical Provider, MD  nitroGLYCERIN (NITROSTAT) 0.4 MG SL tablet Place 0.4 mg under the tongue every 5 (five) minutes as needed for chest pain. For chest pain    Historical Provider, MD  pantoprazole (PROTONIX) 40 MG tablet TAKE 1 TABLET (40 MG TOTAL) BY MOUTH DAILY. 09/01/14   Jolaine Artist, MD  ranolazine (RANEXA) 500  MG 12 hr tablet Take 500 mg by mouth 2 (two) times daily.    Historical Provider, MD  warfarin (COUMADIN) 2 MG tablet Take 2-4 mg by mouth at bedtime. Takes 2mg  daily, except takes 4mg  on Mondays and Fridays.    Historical Provider, MD    Past Medical History: Past Medical History  Diagnosis Date  . Bradycardia   . Ventricular tachycardia     s/p AICD;   h/o ICD shock;  Amiodarone Rx. S/P ablation in 2012  . 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 HTN (hypertension)   . HLD (hyperlipidemia)   . Cytopenia   . Hypothyroidism   . Renal insufficiency   . Claudication   . Chronic systolic heart failure   . Ischemic cardiomyopathy     echo 7/12:  EF 25-30%, mild AI, mod MR, mod LAE  . TIA (transient ischemic attack) 2001  . CVD (cerebrovascular disease)     left CEA 2008  . Stroke   . Anemia   . Inappropriate therapy from implantable cardioverter-defibrillator 04/02/2013    ATP for sinus tach  SVT wavelet identified SVT but was no  passive   . Myocardial infarction 1992  . Anginal pain   . Automatic implantable cardioverter-defibrillator in situ     Medtronic Evera device serial number [id number]    . Arthritis     "a little in my knees" (05/25/2013)  . Melanoma of ear 2013    "near left ear" (05/25/2013)  . 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 Dysrhythmia     HX A FIB  . History of nonmelanoma skin cancer     L EAR  . Kidney stone   . History of transfusion   . Sleep apnea     denies wearing mask (05/25/2013)  . CAD (coronary artery disease)     s/p MI 1982;  s/p DES to CFX 2011;  s/p NSTEMI 4/12 in setting of VTach;  cath 7/12:  LM ok, LAD mid 30-40%, Dx's with 40%, mCFX stent patent, prox to mid RCA occluded with R to R and L to R collats.  Medical Tx was continued  . CHF (congestive heart failure)     HAS VENTRICULAR PUMP    Past Surgical  History: Past Surgical History  Procedure Laterality Date  . Femoral-popliteal bypass graft Right 2011  . Cardiac defibrillator placement  2003; 2005; 05/25/2013    2014: Medtronic Evera device serial number [id number]  . Revascularization / in-situ graft leg    .  Renal artery bypass Bilateral 2003  . Back surgery    . Lumbar disc surgery  1974; 2000's  . Knee arthroscopy Bilateral 1990's  . Elbow arthroscopy Right 1990's  . Cholecystectomy  12/15/?2010  . Lipoma excision Left 2013    "near ear" (05/25/2013)  . Heel spur surgery Right 1990's  . Cataract extraction w/ intraocular lens  implant, bilateral Bilateral 2000  . Carotid endarterectomy Left ~ 2007  . Doppler echocardiography  2011  . Tonsillectomy  1946  . Femoral-popliteal bypass graft Left 05/2002    Archie Endo 05/06/2002 (05/25/2013)  . Tumor excision Left 1960's    "fatty tumor" (05/25/2013)  . Cardiac electrophysiology study and ablation  2012  . Coronary angioplasty  1992  . Cardiac catheterization      "several" (05/25/2013)  . Coronary angioplasty with stent placement      "last one was 11/2012  (05/25/2013)  . Cardioversion N/A 10/15/2013    Procedure: CARDIOVERSION;  Surgeon: Jolaine Artist, MD;  Location: [facility];  Service: Cardiovascular;  Laterality: N/A;  . Insertion of implantable left ventricular assist device N/A 10/19/2013    Procedure: INSERTION OF IMPLANTABLE LEFT VENTRICULAR ASSIST DEVICE;  Surgeon: Gaye Pollack, MD;  Location: [facility];  Service: Open Heart Surgery;  Laterality: N/A;  CIRC ARREST  NITRIC OXIDE  MEDTRONIC ICD  . Intraoperative transesophageal echocardiogram N/A 10/19/2013    Procedure: INTRAOPERATIVE TRANSESOPHAGEAL ECHOCARDIOGRAM;  Surgeon: Gaye Pollack, MD;  Location: [facility] OR;  Service: Open Heart Surgery;  Laterality: N/A;  . Tee without cardioversion N/A 11/04/2013    Procedure: TRANSESOPHAGEAL ECHOCARDIOGRAM (TEE);  Surgeon: Larey Dresser, MD;  Location: [place];  Service:  Cardiovascular;  Laterality: N/A;  . Cardioversion N/A 11/04/2013    Procedure: CARDIOVERSION;  Surgeon: Larey Dresser, MD;  Location: [facility];  Service: Cardiovascular;  Laterality: N/A;  . Cardioversion N/A 06/04/2014    Procedure: CARDIOVERSION;  Surgeon: Jolaine Artist, MD;  Location: [facility] ENDOSCOPY;  Service: Cardiovascular;  Laterality: N/A;  . Cystoscopy with retrograde pyelogram, ureteroscopy and stent placement Left 08/09/2014    Procedure: [place], URETEROSCOPY AND STENT PLACEMENT;  Surgeon: Bernestine Amass, MD;  Location: [facility] ORS;  Service: Urology;  Laterality: Left;  . Cystoscopy with retrograde pyelogram, ureteroscopy and stent placement Left 09/01/2014    Procedure: CYSTOSCOPY WITH URETEROSCOPY AND STENT REMOVAL;  Surgeon: Bernestine Amass, MD;  Location: [facility] ORS;  Service: Urology;  Laterality: Left;  . Holmium laser application Left 55/73/2202    Procedure: HOLMIUM LASER APPLICATION;  Surgeon: Bernestine Amass, MD;  Location: [facility] OR[facility];  Service: Urology;  Laterality: Left;  . Cystoscopy with retrograde pyelogram, ureteroscopy and stent placement Left 09/20/2014    Procedure: CYSTOSCOPY WITH RETROGRADE PYELOGRAM, URETEROSCOPY AND STENT PLACEMENT, stone removal;  Surgeon: Bernestine Amass, MD;  Location: [facility];  Service: Urology;  Laterality: Left;    Family History: Family History  Problem Relation Age of Onset  . Coronary artery disease    . Heart attack Mother   . Coronary artery disease Mother   . Cancer Mother   . Heart disease Mother   . Hyperlipidemia Mother   . Hypertension Mother   . Coronary artery disease Father   . Stroke Father   . Cancer Father   . Heart disease Father     before age 23  . Hyperlipidemia Father   . Hypertension Father   . Heart attack Father     Social History: History  Social History  . Marital Status: Divorced    Spouse Name: N/A    Number of Children: 2  . Years of Education: N/A   Occupational  History  . retired     [employer]  . 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 Smoking status: Former Smoker -- 0.50 packs/day for 37 years    Types: Cigarettes    Quit date: 11/05/2001  . Smokeless tobacco: Never Used  . Alcohol Use: 0.0 oz/week     Comment: 05/25/2013 "mixed drink couple times/month"  . Drug Use: No  . Sexual Activity: No   Other Topics Concern  . Not on file   Social History Narrative   Lives with sig other.    Allergies:  Allergies  Allergen Reactions  . Demerol [Meperidine] Other (See Comments)    Paralysis. Could only move eyes.  . Meperidine Hcl Other (See Comments)    "CAN'T MOVE" CATATONIC PER PT.  [name] Opium Other (See Comments)    "CAN'T MOVE" CATATONIC PER PT.    Objective:    Vital Signs:   Temp:  [97.7 F (36.5 C)-98.1 F (36.7 C)] 97.7 F (36.5 C) (11/23 1100) Pulse Rate:  [74-75] 75 (11/23 1100) Resp:  [20] 20 (11/23 1100) SpO2:  [100 %] 100 % (11/23 1100)   Mean arterial Pressure 80s  Physical Exam: General: Well appearing. No resp difficulty, sitting in chair HEENT: normal Neck: supple. JVP 8, Carotids 2+ bilat; no bruits. No lymphadenopathy or thryomegaly appreciated. Cor: Mechanical heart sounds with LVAD hum present. Lungs: clear Abdomen: soft, nontender, nondistended. No hepatosplenomegaly. No bruits or masses. Good bowel sounds. Driveline: C/D/I; securement device intact and driveline incorporated Extremities: no cyanosis, clubbing, rash, no edema Neuro: alert & orientedx3, cranial nerves grossly intact. moves all 4 extremities w/o difficulty. Affect pleasant    Labs: Basic Metabolic Panel:  Recent Labs Lab 09/21/14 0327 09/22/14 0344 09/23/14 0245 09/27/14 0929  NA 138 137 138 139  K 4.7 5.0 4.3 4.4  CL 103 102 102 101  CO2 23 23 24 24   GLUCOSE 105* 100* 94 99  BUN 31* 31* 32* 34*  CREATININE 2.14* 2.06* 1.90* 1.88*  CALCIUM 8.6 8.8  8.6 9.0    Liver Function Tests: No results for input(s): AST, ALT, ALKPHOS, BILITOT, PROT, ALBUMIN in the last 168 hours. No results for input(s): LIPASE, AMYLASE in the last 168 hours. No results for input(s): AMMONIA in the last 168 hours.  CBC:  Recent Labs Lab 09/21/14 0327 09/22/14 0344 09/23/14 0245 09/27/14 0929  WBC 5.8 5.5 5.0 6.4  HGB 7.9* 10.1* 9.4* 10.1*  HCT 25.3* 31.0* 29.2* 30.3*  MCV 96.6 95.4 92.4 92.9  PLT 189 166 161 171    Cardiac Enzymes: No results for input(s): CKTOTAL, CKMB, CKMBINDEX, TROPONINI in the last 168 hours.  BNP: BNP (last 3 results)  Recent Labs  07/29/14 0957 09/09/14 1038 09/13/14 1024  PROBNP 1468.0* 5067.0* 5152.0*    CBG: No results for input(s): GLUCAP in the last 168 hours.  Coagulation Studies:  Recent Labs  09/27/14 0929  LABPROT 24.2*  INR 2.16*    Other results: EKG: A-pacing 75  Imaging:  No results found.      Assessment:   1) Dyspnea 2) Hematuria with symptomatic anemia 3) Chronic HF - s/p LVAD 10/2014 4) Paroxysmal Afib/Aflutter - s/p DC-CV 06/04/14; in NSR 5) Nephrolithiasis  6) CKD, stage III   Plan/Discussion:    Hemoglobin only mildly decreased. Has gotten 1u RBCs.   Suspect dyspnea may be multifactorial. VAD parameters are stable. However multiple PI events.  Will interrogate ICD to assess for recurrent AF.  ICD interrogation shows NSR with no AF since 8/15. There is long 1AVB of about 323ms. If we decrease pacing to 65bpm it goes to 290. If intrinsic SB will go down to 250 (HR 55-60bpm) We decided to decrease atrial pacing rate to 70 (he does not have CRT)   Will hold lasix tonight as well as may be slightly dry.   F/u with Urology this week.    I reviewed the LVAD parameters from today, and compared the results to the patient's prior recorded data.  No programming changes were made.  The LVAD is functioning within specified parameters.  The patient performs LVAD self-test  daily.  LVAD interrogation was negative for any significant power changes, alarms or PI events/speed drops.  LVAD equipment check completed and is in good working order.  Back-up equipment present.   LVAD education done on emergency procedures and precautions and reviewed exit site care.  Length of Stay: 0  Glori Bickers MD 09/27/2014, 12:38 PM  VAD Team Pager [phone] (7am - 7am) +++VAD ISSUES ONLY+++   Advanced Heart Failure Team Pager [phone] (M-F; [place])  Please contact [facility] for night-coverage after hours (4p -7a ) and weekends on [email or web address] for all non- LVAD Issues

## 2014-09-27 NOTE — Telephone Encounter (Signed)
Patient having more tremors. Checked TSH and it was elevated at 21.74. Free T4 normal. Dr. Haroldine Laws advised to increase synthroid to 150 mcg daily. Sent new Rx for patient and will recheck in 1-2 months. Patient aware.

## 2014-09-28 LAB — TYPE AND SCREEN
ABO/RH(D): O POS
ANTIBODY SCREEN: NEGATIVE
Unit division: 0
Unit division: 0

## 2014-09-29 ENCOUNTER — Encounter: Payer: Self-pay | Admitting: *Deleted

## 2014-10-01 ENCOUNTER — Encounter: Payer: Medicare Other | Admitting: Internal Medicine

## 2014-10-05 ENCOUNTER — Telehealth (HOSPITAL_COMMUNITY): Payer: Self-pay | Admitting: *Deleted

## 2014-10-05 DIAGNOSIS — Z7901 Long term (current) use of anticoagulants: Secondary | ICD-10-CM

## 2014-10-05 DIAGNOSIS — Z95811 Presence of heart assist device: Secondary | ICD-10-CM

## 2014-10-05 NOTE — Telephone Encounter (Signed)
Called [name] re: missed INR yesterday; she was unaware he needed INR; will come by clinic Thursday for INR check prior to appt with Dr. Risa Grill for stent removal.

## 2014-10-06 ENCOUNTER — Other Ambulatory Visit (HOSPITAL_COMMUNITY): Payer: Self-pay | Admitting: *Deleted

## 2014-10-06 DIAGNOSIS — Z95811 Presence of heart assist device: Secondary | ICD-10-CM

## 2014-10-06 DIAGNOSIS — Z7901 Long term (current) use of anticoagulants: Secondary | ICD-10-CM

## 2014-10-07 ENCOUNTER — Ambulatory Visit (HOSPITAL_COMMUNITY): Payer: Self-pay | Admitting: Anesthesiology

## 2014-10-07 ENCOUNTER — Ambulatory Visit (HOSPITAL_COMMUNITY)
Admission: RE | Admit: 2014-10-07 | Discharge: 2014-10-07 | Disposition: A | Discharge status: Home / Self Care | Payer: Medicare Other | Source: Ambulatory Visit | Attending: Internal Medicine | Admitting: Internal Medicine

## 2014-10-07 DIAGNOSIS — Z7901 Long term (current) use of anticoagulants: Secondary | ICD-10-CM | POA: Diagnosis present

## 2014-10-07 DIAGNOSIS — Z95811 Presence of heart assist device: Secondary | ICD-10-CM

## 2014-10-07 DIAGNOSIS — I5022 Chronic systolic (congestive) heart failure: Secondary | ICD-10-CM

## 2014-10-07 LAB — CBC
HCT: 30.9 % — ABNORMAL LOW (ref 39.0–52.0)
Hemoglobin: 10.1 g/dL — ABNORMAL LOW (ref 13.0–17.0)
MCH: 31.1 pg (ref 26.0–34.0)
MCHC: 32.7 g/dL (ref 30.0–36.0)
MCV: 95.1 fL (ref 78.0–100.0)
PLATELETS: 185 10*3/uL (ref 150–400)
RBC: 3.25 MIL/uL — ABNORMAL LOW (ref 4.22–5.81)
RDW: 17.9 % — ABNORMAL HIGH (ref 11.5–15.5)
WBC: 5.8 10*3/uL (ref 4.0–10.5)

## 2014-10-07 LAB — PROTIME-INR
INR: 2.11 — ABNORMAL HIGH (ref 0.00–1.49)
PROTHROMBIN TIME: 23.9 s — AB (ref 11.6–15.2)

## 2014-10-13 ENCOUNTER — Encounter: Payer: Self-pay | Admitting: Internal Medicine

## 2014-10-13 ENCOUNTER — Encounter (HOSPITAL_COMMUNITY): Payer: Self-pay | Admitting: Internal Medicine

## 2014-10-14 ENCOUNTER — Ambulatory Visit: Payer: Self-pay | Admitting: *Deleted

## 2014-10-14 ENCOUNTER — Other Ambulatory Visit (HOSPITAL_COMMUNITY): Payer: Self-pay | Admitting: *Deleted

## 2014-10-14 ENCOUNTER — Ambulatory Visit (HOSPITAL_COMMUNITY)
Admission: RE | Admit: 2014-10-14 | Discharge: 2014-10-14 | Disposition: A | Discharge status: Home / Self Care | Payer: Medicare Other | Source: Ambulatory Visit | Attending: Cardiology | Admitting: Cardiology

## 2014-10-14 ENCOUNTER — Encounter (HOSPITAL_COMMUNITY): Payer: Self-pay | Admitting: Cardiology

## 2014-10-14 VITALS — BP 106/0 | HR 70 | Ht 72.0 in | Wt 198.0 lb

## 2014-10-14 DIAGNOSIS — I4892 Unspecified atrial flutter: Secondary | ICD-10-CM | POA: Insufficient documentation

## 2014-10-14 DIAGNOSIS — I159 Secondary hypertension, unspecified: Secondary | ICD-10-CM

## 2014-10-14 DIAGNOSIS — I4891 Unspecified atrial fibrillation: Secondary | ICD-10-CM | POA: Diagnosis not present

## 2014-10-14 DIAGNOSIS — R319 Hematuria, unspecified: Secondary | ICD-10-CM | POA: Insufficient documentation

## 2014-10-14 DIAGNOSIS — Z7901 Long term (current) use of anticoagulants: Secondary | ICD-10-CM

## 2014-10-14 DIAGNOSIS — I5022 Chronic systolic (congestive) heart failure: Secondary | ICD-10-CM | POA: Diagnosis not present

## 2014-10-14 DIAGNOSIS — Z95811 Presence of heart assist device: Secondary | ICD-10-CM

## 2014-10-14 DIAGNOSIS — N183 Chronic kidney disease, stage 3 (moderate): Secondary | ICD-10-CM | POA: Diagnosis not present

## 2014-10-14 DIAGNOSIS — I10 Essential (primary) hypertension: Secondary | ICD-10-CM | POA: Insufficient documentation

## 2014-10-14 LAB — COMPREHENSIVE METABOLIC PANEL
ALK PHOS: 110 U/L (ref 39–117)
ALT: 18 U/L (ref 0–53)
AST: 26 U/L (ref 0–37)
Albumin: 3.8 g/dL (ref 3.5–5.2)
Anion gap: 11 (ref 5–15)
BUN: 26 mg/dL — ABNORMAL HIGH (ref 6–23)
CO2: 25 meq/L (ref 19–32)
Calcium: 9.3 mg/dL (ref 8.4–10.5)
Chloride: 102 mEq/L (ref 96–112)
Creatinine, Ser: 1.77 mg/dL — ABNORMAL HIGH (ref 0.50–1.35)
GFR calc non Af Amer: 36 mL/min — ABNORMAL LOW (ref >90)
GFR, EST AFRICAN AMERICAN: 42 mL/min — AB (ref >90)
GLUCOSE: 78 mg/dL (ref 70–99)
POTASSIUM: 4.6 meq/L (ref 3.7–5.3)
Sodium: 138 mEq/L (ref 137–147)
TOTAL PROTEIN: 6.7 g/dL (ref 6.0–8.3)
Total Bilirubin: 0.5 mg/dL (ref 0.3–1.2)

## 2014-10-14 LAB — CBC
HCT: 32.9 % — ABNORMAL LOW (ref 39.0–52.0)
HEMOGLOBIN: 10.7 g/dL — AB (ref 13.0–17.0)
MCH: 31.1 pg (ref 26.0–34.0)
MCHC: 32.5 g/dL (ref 30.0–36.0)
MCV: 95.6 fL (ref 78.0–100.0)
Platelets: 162 10*3/uL (ref 150–400)
RBC: 3.44 MIL/uL — AB (ref 4.22–5.81)
RDW: 17.6 % — ABNORMAL HIGH (ref 11.5–15.5)
WBC: 5.1 10*3/uL (ref 4.0–10.5)

## 2014-10-14 LAB — PRO B NATRIURETIC PEPTIDE: Pro B Natriuretic peptide (BNP): 1880 pg/mL — ABNORMAL HIGH (ref 0–125)

## 2014-10-14 LAB — LACTATE DEHYDROGENASE: LDH: 283 U/L — AB (ref 94–250)

## 2014-10-14 LAB — PREALBUMIN: Prealbumin: 21.6 mg/dL (ref 17.0–34.0)

## 2014-10-14 LAB — PROTIME-INR
INR: 1.76 — AB (ref 0.00–1.49)
PROTHROMBIN TIME: 20.7 s — AB (ref 11.6–15.2)

## 2014-10-14 LAB — MAGNESIUM: Magnesium: 2.5 mg/dL (ref 1.5–2.5)

## 2014-10-14 MED ORDER — HYDRALAZINE HCL 25 MG PO TABS: 37.5000 mg | ORAL_TABLET | Freq: Three times a day (TID) | ORAL | Status: DC

## 2014-10-14 NOTE — Patient Instructions (Addendum)
1.  Take coumadin 6 mg today and tomorrow; increase dose to 2 mg daily except 4 mg on Mon/Wed/Fri.  Recheck INR in one week. 2.  Increase Hydralazine to 37.5 mg three times daily. 3.  Return to [facility] clinic in 6 weeks. Bring home equipment for annual maintenance.

## 2014-10-14 NOTE — Progress Notes (Signed)
Patient ID: [name], male   DOB: 08-15-1941, 73 y.o.   MRN: [id number]  PCP: Dr. Karolee Stamps ([place])  Kamir Selover is a 73 yo male with h/o VT, PAD and severe systolic HF (EF 92-42%) due to mixed ischemic/nonischemic CM he is s/p HM II LVAD implant for destination therapy on October 19, 2013. On 09/01/14 underwent cystoscopy and flexible ureteroscopy with lithotripsy and basket extraction.   VAD implantation was complicated by VT, syncope, A fib/Aflutter and persistent low output felt to be due to RHF. RHC showed low cardiac output but no evidence of RV failure on Milrinone. He was felt to be volume depleted. He was discharged home on 11/18/13 on mirlinone and PRN lasix 20 mg for a weight of 200 pounds or greater. Milrinone has since been weaned off.   RHC (07/13/14): Well compensated hemodynamics with VAD support.  Admitted 11/9-11/19/15 for hematuria and chronic nephrolithiasis. Underwent Echo to assess RV for concern RV failure because he presented with SOB. RV looked good and had renal US showed mild hyronephrosis and bilateral renal cysts. Dr. Risa Grill took back for repeat lithotripsy and stone extraction by ureteroscopy and J stent placement. Received total of 3 PRBCs. D/C weight 188 lbs.   Follow up for Heart Failure/LVAD: Was seen in short stay d/t concern of anemia because he was having SOB and received 1 PRBC. ICD interrogated and showed NSR with no Afib and long AVB with 320 msec. We decreased atrial pacing to 70. Saw Dr. Risa Grill and stent was removed. No longer having hematuria. Feels great and still working. Denies SOB, orthopnea or CP. Has LE edema occasionally and takes lasix PRN which helps. Still having some N/V. Weight at home 188-193 lbs. Following a low salt diet and drinking less than 2L a day. Walking 3 miles a day.    Denies LVAD alarms.  Denies driveline trauma, erythema or drainage.  Denies ICD shocks.    Reports taking Coumadin as prescribed and adherence to  anticoagulation based dietary restrictions.  Denies bright red blood per rectum or melena, no dark urine or hematuria.     Past Medical History  Diagnosis Date  . Bradycardia   . Ventricular tachycardia     s/p AICD;   h/o ICD shock;  Amiodarone Rx. S/P ablation in 2012  . 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 HTN (hypertension)   . HLD (hyperlipidemia)   . Cytopenia   . Hypothyroidism   . Renal insufficiency   . Claudication   . Chronic systolic heart failure   . Ischemic cardiomyopathy     echo 7/12:  EF 25-30%, mild AI, mod MR, mod LAE  . TIA (transient ischemic attack) 2001  . CVD (cerebrovascular disease)     left CEA 2008  . Stroke   . Anemia   . Inappropriate therapy from implantable cardioverter-defibrillator 04/02/2013    ATP for sinus tach  SVT wavelet identified SVT but was no  passive   . Myocardial infarction 1992  . Anginal pain   . Automatic implantable cardioverter-defibrillator in situ     Medtronic Evera device serial number [id number]    . Arthritis     "a little in my knees" (05/25/2013)  . Melanoma of ear 2013    "near left ear" (05/25/2013)  .  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 Dysrhythmia     HX A FIB  . History of nonmelanoma skin cancer     L EAR  . Kidney stone   . History of transfusion   . Sleep apnea     denies wearing mask (05/25/2013)  . CAD (coronary artery disease)     s/p MI 1982;  s/p DES to CFX 2011;  s/p NSTEMI 4/12 in setting of VTach;  cath 7/12:  LM ok, LAD mid 30-40%, Dx's with 40%, mCFX stent patent, prox to mid RCA occluded with R to R and L to R collats.  Medical Tx was continued  . CHF (congestive heart failure)     HAS VENTRICULAR PUMP    Current Outpatient Prescriptions  Medication Sig Dispense Refill  . amiodarone (PACERONE) 200 MG tablet Take 200 mg by mouth 2 (two) times daily.    [name]  amLODipine (NORVASC) 10 MG tablet Take 10 mg by mouth every evening.    [name] aspirin 81 MG tablet Take 81 mg by mouth every morning.     [name] atorvastatin (LIPITOR) 80 MG tablet Take 80 mg by mouth every morning.    . cephALEXin (KEFLEX) 250 MG capsule Take 1 capsule (250 mg total) by mouth daily. 30 capsule 2  . docusate sodium (COLACE) 100 MG capsule Take 200 mg by mouth every morning.    . ferrous gluconate (FERGON) 324 MG tablet Take 1 tablet (324 mg total) by mouth 3 (three) times daily with meals. 90 tablet 6  . furosemide (LASIX) 20 MG tablet Take 20 mg by mouth as needed.     . hydrALAZINE (APRESOLINE) 25 MG tablet Take 1 tablet (25 mg total) by mouth 3 (three) times daily. 90 tablet 6  . levothyroxine (SYNTHROID, LEVOTHROID) 150 MCG tablet Take 1 tablet (150 mcg total) by mouth daily. 30 tablet 3  . magnesium gluconate (MAGONATE) 500 MG tablet Take 1,000 mg by mouth daily.    . Multiple Vitamin (MULTIVITAMIN) tablet Take 1 tablet by mouth at bedtime.     . Multiple Vitamins-Minerals (ICAPS PO) Take 1 tablet by mouth 2 (two) times daily.    . pantoprazole (PROTONIX) 40 MG tablet TAKE 1 TABLET (40 MG TOTAL) BY MOUTH DAILY. 30 tablet 6  . ranolazine (RANEXA) 500 MG 12 hr tablet Take 500 mg by mouth 2 (two) times daily.    [name] warfarin (COUMADIN) 2 MG tablet Take 2-4 mg by mouth at bedtime. Takes 2mg  daily, except takes 4mg  on Mondays and Fridays.    [name] zinc gluconate 50 MG tablet Take 100 mg by mouth.    [name] HYDROcodone-acetaminophen (NORCO/VICODIN) 5-325 MG per tablet Take 1-2 tablets by mouth every 6 (six) hours as needed. (Patient not taking: Reported on 10/14/2014) 20 tablet 0  . nitroGLYCERIN (NITROSTAT) 0.4 MG SL tablet Place 0.4 mg under the tongue every 5 (five) minutes as needed for chest pain. For chest pain     No current facility-administered medications for this encounter.    Demerol; Meperidine hcl; and Opium  REVIEW OF SYSTEMS: All systems negative except as listed in HPI, PMH and  Problem list.  LVAD INTERROGATION:   HeartMate II LVAD:   Speed: 9200 Flow 3.8  liters/min Power 4.9 PI 7.0 Alarms: none Events: PI events 5-10 a day and a few low voltage advisories; 30 PI events on 07/09/14 Fixed speed: 9200 Low speed limit:  8600 Primary Controller: Replace back up battery (Feb 2017) Back  up controller: Replace back up battery (Feb 2017)  I reviewed the LVAD parameters from today, and compared the results to the patient's prior recorded data.  Increased set speed to 9200 and back-up speed to 8600. The LVAD is functioning within specified parameters.  The patient performs LVAD self-test daily.  LVAD interrogation was negative for any significant power changes, alarms or PI events/speed drops.  LVAD equipment check completed and is in good working order.  Back-up equipment present.   LVAD education done on emergency procedures and precautions and reviewed exit site care.    Filed Vitals:   10/14/14 1016  BP: 106/0  Pulse: 70  Height: 6' (1.829 m)  Weight: 198 lb (89.812 kg)  SpO2: 98%    Physical Exam: GENERAL: Well appearing, male who presents to clinic today in no acute distress. Wife present HEENT: normal  NECK: Supple, JVP 7.  2+ bilaterally, no bruits.  No lymphadenopathy or thyromegaly appreciated.   CARDIAC:  Mechanical heart sounds with LVAD hum present.  LUNGS:  Clear to auscultation bilaterally.  ABDOMEN:  Soft, round, nontender, positive bowel sounds x4.     LVAD exit site: well-healed and incorporated.  Dressing dry and intact.  No erythema or drainage.  Stabilization device present and accurately applied.  Driveline dressing is being changed weekly per sterile technique. EXTREMITIES:  Warm and dry, no cyanosis, clubbing, bilateral 2 + bilateral edema NEUROLOGIC:  Alert and oriented x 4.  Gait steady.  No aphasia.  No dysarthria.  Affect pleasant.      ASSESSMENT AND PLAN:   1. Chronic systolic HF: s/p LVAD 54/5625 - NYHA II symptoms and  volume status looks good. Continue lasix PRN.  - Optivol interrogated: No crossings over threshold; patient activity ~2-3 hrs a day. Appears to have small burst of Afib around December 3rd. Close to 100% of BiV pacing.  - He is not on a BB with history of RV failure.  - MAP slightly elevated, will increase hydralazine to 37.5 mg TID. Continue amlodipine at current dose.  - No ACE-I it increased his renal function in the past.  - Reinforced the need and importance of daily weights, a low sodium diet, and fluid restriction (less than 2 L a day). Instructed to call the HF clinic if weight increases more than 3 lbs overnight or 5 lbs in a week.  2. Afib/Flutter - s/p DC-CV 06/04/14. ICD interrogated when he was in short stay for blood 09/28/14 and no afib since August. On optivol appears to have small burst of fib around December 3rd which correlates with when he had a lot of PI events. Will continue amiodarone 200 mg BID. Had TSH checked by PCP last week.  Check LFTs today and will need to check PFTs next visit. Educated to get yearly eye exams.  2) HTN - Slightly elevated, as above will increase hydralazine.  3) LVAD - LVAD parameters stable.  - Will do Intermacs data today including 6 min walk test. - Check CMET, INR, LDH, pro-BNP, prealbumin, LDH and Magnesium today.   4) CKD, stage III - Check CMET today.  5) Anticoagulation management: - Check INR today and will address accordingly 6) Hematuria - Resolved and stent has been removed.  7) S/P Renal Stone lithotripsy - Saw Dr. Risa Grill and doing well. No hematuria and stent removed.   F/U 6 weeks Junie Bame B  NP-C 10:42 AM

## 2014-10-14 NOTE — Progress Notes (Signed)
Symptom  Yes  No  Details   Angina       x Activity:   Claudication        x How far:   Syncope        x When:     Stroke        x        TIA  2001  Orthopnea         x How many pillows: 1  PND         x How often:  CPAP       N/A How many hrs:   Pedal edema        x   x 1 - PRN lasix with relief  Abd fullness               x    N&V        x         1 x weekly  Diaphoresis               x   Bleeding              x    Urine color        yellow  SOB        x      Activity:  incline  Palpitations        x When:  ICD shock        x   Hospitlizaitons        x           When/where/why:  11/9 - 09/23/14  ED visit        x When/where/why:  Other MD       x          When/who/why:   10/07/14 Dr. Risa Grill - stent removed  Activity        Working 5 hrs/5 days week; walking 2 - 3 miles day  Fluid    No limitations  < 2000   Diet    Low salt food   Vital signs: HR: 70 MAP BP:  106 O2 Sat:  98 Wt:  198 lbs  Last wt: 205.2  lbs Ht: 6'1"  LVAD interrogation reveals:  Speed:   9200 RPM Flow:  5.2 Power:   5.5 PI:  5.7 Alarms:  several low voltage advisories Events:  5 - 10 PI events  Fixed speed:  9200 Low speed limit:  8600 Primary Controller:  Replace back up battery in 20 months  (Feb 2017) Back up controller:   Replace back up battery in 20 months (Feb 2017)  Drive line exit site well healed and incorporated. The velour is fully implanted at exit site. Dressing dry and intact. No erythema or drainage. Stabilization device present and accurately applied. Driveline dressing is being changed weekly per sterile technique using Sorbaview dressing with biopatch on exit site. Pt denies fever or chills. Pt provided with dressing supplies for home.    Patient/caregiver deny any alarms or VAD equipment issues. VAD coordinator reviewed daily log from home for daily temperature, weight, and VAD parameters. Pt is performing daily controller and system monitor self tests along with completing  weekly and monthly maintenance for LVAD equipment.  LVAD equipment check completed and is in good working order. Back-up equipment present. Emergency procedures and precautions reviewed.  Optivol interrogation completed and reviewed.    Reviewed and demonstrated the following to patient and caregiver:  Reviewed the steps for replacing the running system controller with the back-up system controller (see patient handbook section 2 or the appropriate pamphlet).             X  Demonstrated (using the mock-driveline and controller) how to connect and disconnect the mock-driveline in back up system controller in a timely manner (less than 10 seconds) with return demonstration by patient and caregiver.              X   Reviewed system controller alarms and troubleshooting including hazard and advisory alarms and accessing alarm history. Re-enforced NOT TO attempt to perform any task displayed on the display screen alone or without calling the VAD pager [phone] (see patient handbook section 5 or the appropriate pamphlet).                       X  Reviewed how to handle an emergency including when the pump is running and when the pump has stopped (see patient handbook section 8). Call 911 first and then the VAD pager at [phone].             X   Reviewed 14-volt lithium ion battery calibration steps (see patient handbook section 3).            X   Reviewed contents of black bag and what must be with patient at all times.            X  Reviewed driveline exit site including cleansing, dressing, and immobilizing with an anchor device to prevent exit site trauma.             X  Reviewed weekly maintenance which includes: Reviewing "Replacing the [place] with a [employer]" pamphlet. Clean batteries, clips, and battery charger contacts.  Check cables for damages. Rotate batteries; keep all 8 charged.              X  Reviewed monthly maintenance  which includes: Reviewing Alarms and Troubleshooting. Check battery manufacturer dates. Check use/charge cycles for each battery; remember to re-calibrate every 70 uses when prompted.              X  Additional pamphlets Guide to Replacing the [place] with the [place] for Patients and Their Caregivers and HM II Alarms for Patients and Their Caregivers given to patient.              X   Batteries Manufacture Date: Number of uses: Re-calibration  10/2013 @ 70 Performed by patient    Backup system controller 11 volt battery charged during visit.  1 year Intermacs follow up completed including:  Quality of Life, KCCQ-12, and Neurocognitive trail making.   Pt completed 860 feet during 6 minute walk without SOB or fatigue.  Pt will bring power module and UBC to next clinic visit for annual preventative maintenance per [employer].   Chronic amiodarone therapy:  Pt has had recent thyroid panel, LFT's checked today, eye exam scheduled January 2016; will add PFT's to next clinic visit. Explained necessity for screening due to amiodarone therapy with verbalized understanding from pt/caregiver.

## 2014-10-18 ENCOUNTER — Other Ambulatory Visit (HOSPITAL_COMMUNITY): Payer: Self-pay | Admitting: Internal Medicine

## 2014-10-18 LAB — PROTIME-INR: INR: 2.5 — AB (ref <=1.1)

## 2014-10-19 ENCOUNTER — Encounter: Payer: Self-pay | Admitting: Licensed Clinical Social Worker

## 2014-10-19 ENCOUNTER — Ambulatory Visit (HOSPITAL_COMMUNITY): Payer: Self-pay | Admitting: *Deleted

## 2014-10-19 LAB — CBC/DIFF AMBIGUOUS DEFAULT
BASOS: 1 %
Basophils Absolute: 0 10*3/uL (ref 0.0–0.2)
EOS ABS: 0.3 10*3/uL (ref 0.0–0.4)
EOS: 6 %
HCT: 32.4 % — ABNORMAL LOW (ref 37.5–51.0)
HEMOGLOBIN: 10.4 g/dL — AB (ref 12.6–17.7)
LYMPHS ABS: 0.9 10*3/uL (ref 0.7–3.1)
Lymphs: 17 %
MCH: 30.4 pg (ref 26.6–33.0)
MCHC: 32.1 g/dL (ref 31.5–35.7)
MCV: 95 fL (ref 79–97)
MONOS ABS: 0.8 10*3/uL (ref 0.1–0.9)
Monocytes: 15 %
NEUTROS PCT: 61 %
Neutrophils Absolute: 3.3 10*3/uL (ref 1.4–7.0)
Platelets: 178 10*3/uL (ref 150–379)
RBC: 3.42 x10E6/uL — ABNORMAL LOW (ref 4.14–5.80)
RDW: 17.4 % — ABNORMAL HIGH (ref 12.3–15.4)
WBC: 5.4 10*3/uL (ref 3.4–10.8)

## 2014-10-19 LAB — PROTIME-INR
INR: 2.5 — ABNORMAL HIGH (ref 0.8–1.2)
PROTHROMBIN TIME: 27.6 s — AB (ref 9.1–12.0)

## 2014-10-19 NOTE — Progress Notes (Signed)
CSW met with patient and SO [name] in the clinic to complete an annual reassessment. Patient was in good spirits and stated how well he feels. He reports that he is compliant with all medical appointments medications and overall medical regimen. He states he lives next door to the [employer] and will utilize their generator in the event of an outage. He is driving himself and has [name] to assist when needed. He has a Living Will and HPOA is [name] (SO). He appeared to be well groomed, alert and oriented, good eye contact and positive/uplifting affect. His speech and thoughts were clear and concise. He resides with his SO of many years [name] who continues to be his primary caregiver (when needed). His neighbor Corky Mull is the back up although he has been independent and not in need of any assistance. He reports he has returned to work as a Presenter, broadcasting at [employer] and walks about 2.5 miles everyday at work. He enjoys playing golf and denies any barriers to his lifestyle. He denies any changes in finances and continues with Medicare and BC/BS as insurance plans. When asked how he has been feeling over the past year he stated "Fantastic". He noted when he looks in the mirror he is thankful "I am still alive". Patient appears to have good body image with the VAD implant. He states that "I don't get stressed - that's just who I am". He reports 8-9 hours of sleep at night and a good appetite. He scored a 0 on the PHQ-2 and stated "I have too much to live for". Patient has been regularly attending the VAD Support Group and states that his life has been improved by receiving the VAD. He feels supported by the team and "glad to see it growing". CSW will continue to support and monitor for additional needs. [name], LCSW [phone]

## 2014-10-20 NOTE — Telephone Encounter (Signed)
Patient scheduled office visit with Dr. Caryl Comes.

## 2014-11-09 ENCOUNTER — Telehealth (HOSPITAL_COMMUNITY): Payer: Self-pay | Admitting: Infectious Diseases

## 2014-11-09 ENCOUNTER — Ambulatory Visit (HOSPITAL_COMMUNITY): Payer: Self-pay | Admitting: Infectious Diseases

## 2014-11-09 ENCOUNTER — Other Ambulatory Visit (HOSPITAL_COMMUNITY): Payer: Self-pay | Admitting: Internal Medicine

## 2014-11-09 LAB — PROTIME-INR: INR: 1.7 — AB (ref <=1.1)

## 2014-11-09 NOTE — Telephone Encounter (Signed)
A user error has taken place: encounter opened in error, closed for administrative reasons.

## 2014-11-10 LAB — PROTIME-INR
INR: 1.7 — AB (ref 0.8–1.2)
PROTHROMBIN TIME: 18 s — AB (ref 9.1–12.0)

## 2014-11-16 ENCOUNTER — Encounter: Payer: Self-pay | Admitting: Internal Medicine

## 2014-11-16 ENCOUNTER — Other Ambulatory Visit (HOSPITAL_COMMUNITY): Payer: Self-pay | Admitting: Internal Medicine

## 2014-11-16 LAB — PROTIME-INR: INR: 2.5 — AB (ref 0.9–1.1)

## 2014-11-17 ENCOUNTER — Ambulatory Visit: Payer: Self-pay | Admitting: *Deleted

## 2014-11-17 LAB — PROTIME-INR
INR: 2.5 — ABNORMAL HIGH (ref 0.8–1.2)
PROTHROMBIN TIME: 28 s — AB (ref 9.1–12.0)

## 2014-11-22 ENCOUNTER — Other Ambulatory Visit (HOSPITAL_COMMUNITY): Payer: Self-pay | Admitting: *Deleted

## 2014-11-22 ENCOUNTER — Other Ambulatory Visit (HOSPITAL_COMMUNITY): Payer: Self-pay | Admitting: Infectious Diseases

## 2014-11-22 DIAGNOSIS — Z7901 Long term (current) use of anticoagulants: Secondary | ICD-10-CM

## 2014-11-22 DIAGNOSIS — Z95811 Presence of heart assist device: Secondary | ICD-10-CM

## 2014-11-22 DIAGNOSIS — Z79899 Other long term (current) drug therapy: Secondary | ICD-10-CM

## 2014-11-25 ENCOUNTER — Other Ambulatory Visit (HOSPITAL_COMMUNITY): Payer: Self-pay | Admitting: Adult Health

## 2014-11-25 ENCOUNTER — Ambulatory Visit (HOSPITAL_COMMUNITY)
Admission: RE | Admit: 2014-11-25 | Discharge: 2014-11-25 | Disposition: A | Discharge status: Home / Self Care | Payer: Medicare Other | Source: Ambulatory Visit | Attending: Internal Medicine | Admitting: Internal Medicine

## 2014-11-25 ENCOUNTER — Other Ambulatory Visit (HOSPITAL_COMMUNITY): Payer: Self-pay | Admitting: *Deleted

## 2014-11-25 ENCOUNTER — Ambulatory Visit (HOSPITAL_COMMUNITY): Payer: Self-pay | Admitting: *Deleted

## 2014-11-25 VITALS — BP 90/0 | HR 70 | Ht 72.0 in | Wt 204.0 lb

## 2014-11-25 DIAGNOSIS — N183 Chronic kidney disease, stage 3 unspecified: Secondary | ICD-10-CM

## 2014-11-25 DIAGNOSIS — R319 Hematuria, unspecified: Secondary | ICD-10-CM | POA: Insufficient documentation

## 2014-11-25 DIAGNOSIS — I5022 Chronic systolic (congestive) heart failure: Secondary | ICD-10-CM | POA: Diagnosis not present

## 2014-11-25 DIAGNOSIS — Z7901 Long term (current) use of anticoagulants: Secondary | ICD-10-CM | POA: Diagnosis not present

## 2014-11-25 DIAGNOSIS — Z79899 Other long term (current) drug therapy: Secondary | ICD-10-CM

## 2014-11-25 DIAGNOSIS — I10 Essential (primary) hypertension: Secondary | ICD-10-CM | POA: Insufficient documentation

## 2014-11-25 DIAGNOSIS — I4891 Unspecified atrial fibrillation: Secondary | ICD-10-CM | POA: Diagnosis not present

## 2014-11-25 DIAGNOSIS — I158 Other secondary hypertension: Secondary | ICD-10-CM

## 2014-11-25 DIAGNOSIS — Z95811 Presence of heart assist device: Secondary | ICD-10-CM | POA: Diagnosis not present

## 2014-11-25 DIAGNOSIS — R0600 Dyspnea, unspecified: Secondary | ICD-10-CM

## 2014-11-25 LAB — BRAIN NATRIURETIC PEPTIDE: B NATRIURETIC PEPTIDE 5: 420.8 pg/mL — AB (ref 0.0–100.0)

## 2014-11-25 LAB — BASIC METABOLIC PANEL
Anion gap: 12 (ref 5–15)
BUN: 23 mg/dL (ref 6–23)
CHLORIDE: 106 meq/L (ref 96–112)
CO2: 20 mmol/L (ref 19–32)
Calcium: 8.8 mg/dL (ref 8.4–10.5)
Creatinine, Ser: 1.75 mg/dL — ABNORMAL HIGH (ref 0.50–1.35)
GFR calc Af Amer: 42 mL/min — ABNORMAL LOW (ref >90)
GFR, EST NON AFRICAN AMERICAN: 36 mL/min — AB (ref >90)
Glucose, Bld: 97 mg/dL (ref 70–99)
POTASSIUM: 4.1 mmol/L (ref 3.5–5.1)
SODIUM: 138 mmol/L (ref 135–145)

## 2014-11-25 LAB — CBC
HCT: 33.1 % — ABNORMAL LOW (ref 39.0–52.0)
HEMOGLOBIN: 11.1 g/dL — AB (ref 13.0–17.0)
MCH: 31.4 pg (ref 26.0–34.0)
MCHC: 33.5 g/dL (ref 30.0–36.0)
MCV: 93.8 fL (ref 78.0–100.0)
Platelets: 180 10*3/uL (ref 150–400)
RBC: 3.53 MIL/uL — ABNORMAL LOW (ref 4.22–5.81)
RDW: 16.7 % — ABNORMAL HIGH (ref 11.5–15.5)
WBC: 5 10*3/uL (ref 4.0–10.5)

## 2014-11-25 LAB — PROTIME-INR
INR: 2.94 — ABNORMAL HIGH (ref 0.00–1.49)
Prothrombin Time: 30.9 seconds — ABNORMAL HIGH (ref 11.6–15.2)

## 2014-11-25 LAB — LACTATE DEHYDROGENASE: LDH: 276 U/L — AB (ref 94–250)

## 2014-11-25 NOTE — Patient Instructions (Signed)
1.  Decrease coumadin to 2 mg daily except 4 mg on Mon/Fri.  Re-check INR in two weeks. 2.  Take Lasix dose today. 3.  Return to [facility] in 2 months.

## 2014-11-25 NOTE — Progress Notes (Addendum)
Patient ID: [name], male   DOB: Apr 14, 1941, 74 y.o.   MRN: [id number]  PCP: Dr. Karolee Stamps ([place])  [name] is a 74 yo male with h/o VT, PAD and severe systolic HF (EF 37-16%) due to mixed ischemic/nonischemic CM he is s/p HM II LVAD implant for destination therapy on October 19, 2013. On 09/01/14 underwent cystoscopy and flexible ureteroscopy with lithotripsy and basket extraction.   VAD implantation was complicated by VT, syncope, A fib/Aflutter and persistent low output felt to be due to RHF. RHC showed low cardiac output but no evidence of RV failure on Milrinone. He was felt to be volume depleted. He was discharged home on 11/18/13 on mirlinone and PRN lasix 20 mg for a weight of 200 pounds or greater. Milrinone has since been weaned off.   RHC (07/13/14): Well compensated hemodynamics with VAD support.  Admitted 11/9-11/19/15 for hematuria and chronic nephrolithiasis. Underwent Echo to assess RV for concern RV failure because he presented with SOB. RV looked good and had renal US showed mild hyronephrosis and bilateral renal cysts. Dr. Risa Grill took back for repeat lithotripsy and stone extraction by ureteroscopy and J stent placement. Received total of 3 PRBCs. D/C weight 188 lbs.   Follow up for Heart Failure/LVAD:  Last visit hydralazine was increased due to elevated hydralazine. Weight at home 193-195 pounds. Taking lasix once every 2 weeks. No BRBPR. No longer having hematuria. Denies SOB/PND/Orthopnea. Working 5 days a week. Able to walk over 3 miles a daily.  Following a low salt diet and drinking less than 2L a day.   Denies LVAD alarms.  Denies driveline trauma, erythema or drainage.  Denies ICD shocks.    Reports taking Coumadin as prescribed and adherence to anticoagulation based dietary restrictions.  Denies bright red blood per rectum or melena, no dark urine or hematuria.     Past Medical History  Diagnosis Date  . Bradycardia   . Ventricular  tachycardia     s/p AICD;   h/o ICD shock;  Amiodarone Rx. S/P ablation in 2012  . 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 HTN (hypertension)   . HLD (hyperlipidemia)   . Cytopenia   . Hypothyroidism   . Renal insufficiency   . Claudication   . Chronic systolic heart failure   . Ischemic cardiomyopathy     echo 7/12:  EF 25-30%, mild AI, mod MR, mod LAE  . TIA (transient ischemic attack) 2001  . CVD (cerebrovascular disease)     left CEA 2008  . Stroke   . Anemia   . Inappropriate therapy from implantable cardioverter-defibrillator 04/02/2013    ATP for sinus tach  SVT wavelet identified SVT but was no  passive   . Myocardial infarction 1992  . Anginal pain   . Automatic implantable cardioverter-defibrillator in situ     [employer]onic Evera device serial number [id number]    . Arthritis     "a little in my knees" (05/25/2013)  . Melanoma of ear 2013    "near left ear" (05/25/2013)  . 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 Dysrhythmia     HX A FIB  . History of nonmelanoma skin cancer     L EAR  . Kidney stone   . History of transfusion   .  Sleep apnea     denies wearing mask (05/25/2013)  . CAD (coronary artery disease)     s/p MI 1982;  s/p DES to CFX 2011;  s/p NSTEMI 4/12 in setting of VTach;  cath 7/12:  LM ok, LAD mid 30-40%, Dx's with 40%, mCFX stent patent, prox to mid RCA occluded with R to R and L to R collats.  Medical Tx was continued  . CHF (congestive heart failure)     HAS VENTRICULAR PUMP    Current Outpatient Prescriptions  Medication Sig Dispense Refill  . amiodarone (PACERONE) 200 MG tablet Take 200 mg by mouth 2 (two) times daily.    [name] amLODipine (NORVASC) 10 MG tablet Take 10 mg by mouth every evening.    [name] aspirin 81 MG tablet Take 81 mg by mouth every morning.     [name] atorvastatin (LIPITOR) 80 MG tablet Take 80 mg by mouth  every morning.    . cephALEXin (KEFLEX) 250 MG capsule Take 1 capsule (250 mg total) by mouth daily. 30 capsule 2  . docusate sodium (COLACE) 100 MG capsule Take 200 mg by mouth every morning.    . ferrous gluconate (FERGON) 324 MG tablet Take 1 tablet (324 mg total) by mouth 3 (three) times daily with meals. 90 tablet 6  . furosemide (LASIX) 20 MG tablet Take 20 mg by mouth as needed.     . hydrALAZINE (APRESOLINE) 25 MG tablet Take 1.5 tablets (37.5 mg total) by mouth 3 (three) times daily. 150 tablet 6  . HYDROcodone-acetaminophen (NORCO/VICODIN) 5-325 MG per tablet Take 1-2 tablets by mouth every 6 (six) hours as needed. (Patient not taking: Reported on 10/14/2014) 20 tablet 0  . levothyroxine (SYNTHROID, LEVOTHROID) 150 MCG tablet Take 1 tablet (150 mcg total) by mouth daily. 30 tablet 3  . magnesium gluconate (MAGONATE) 500 MG tablet Take 1,000 mg by mouth daily.    . Multiple Vitamin (MULTIVITAMIN) tablet Take 1 tablet by mouth at bedtime.     . Multiple Vitamins-Minerals (ICAPS PO) Take 1 tablet by mouth 2 (two) times daily.    . nitroGLYCERIN (NITROSTAT) 0.4 MG SL tablet Place 0.4 mg under the tongue every 5 (five) minutes as needed for chest pain. For chest pain    . pantoprazole (PROTONIX) 40 MG tablet TAKE 1 TABLET (40 MG TOTAL) BY MOUTH DAILY. 30 tablet 6  . ranolazine (RANEXA) 500 MG 12 hr tablet Take 500 mg by mouth 2 (two) times daily.    [name] warfarin (COUMADIN) 2 MG tablet Take 2-4 mg by mouth at bedtime. Takes 2mg  daily, except takes 4mg  on Mondays and Fridays.    [name] zinc gluconate 50 MG tablet Take 100 mg by mouth.     No current facility-administered medications for this encounter.    Demerol; Meperidine hcl; and Opium  REVIEW OF SYSTEMS: All systems negative except as listed in HPI, PMH and Problem list.  LVAD INTERROGATION:   HeartMate II LVAD:   Speed: 9200 Flow 3.9  liters/min Power 4.9 PI 7.0 Alarms: none Events: PI events 10-15 PI events Fixed speed: 9200 Low  speed limit:  8600 Primary Controller: Replace back up battery (Feb 2017) Back up controller: Replace back up battery (Feb 2017)  I reviewed the LVAD parameters from today, and compared the results to the patient's prior recorded data.  Increased set speed to 9200 and back-up speed to 8600. The LVAD is functioning within specified parameters.  The patient performs LVAD self-test daily.  LVAD interrogation was  negative for any significant power changes, alarms or PI events/speed drops.  LVAD equipment check completed and is in good working order.  Back-up equipment present.   LVAD education done on emergency procedures and precautions and reviewed exit site care.    Filed Vitals:   11/25/14 1335  BP: 90/0  Pulse: 70  Height: 6' (1.829 m)  Weight: 204 lb (92.534 kg)  SpO2: 98%    Physical Exam: GENERAL: Well appearing, male who presents to clinic today in no acute distress. Wife present HEENT: normal  NECK: Supple, JVP 7.  2+ bilaterally, no bruits.  No lymphadenopathy or thyromegaly appreciated.   CARDIAC:  Mechanical heart sounds with LVAD hum present.  LUNGS:  Clear to auscultation bilaterally.  ABDOMEN:  Soft, round, nontender, positive bowel sounds x4.     LVAD exit site: well-healed and incorporated.  Dressing dry and intact.  No erythema or drainage.  Stabilization device present and accurately applied.  Driveline dressing is being changed weekly per sterile technique. EXTREMITIES:  Warm and dry, no cyanosis, clubbing, bilateral 2 + bilateral edema NEUROLOGIC:  Alert and oriented x 4.  Gait steady.  No aphasia.  No dysarthria.  Affect pleasant.      ASSESSMENT AND PLAN:   1. Chronic systolic HF: s/p LVAD 93/2355 - NYHA II symptoms and volume status looks good. Continue lasix PRN.  - Optivol interrogated: No crossings over threshold; patient activity ~2-3 hrs a day.  Close to 100% of BiV pacing.  - He is not on a BB with history of RV failure.  - MAP ok today. Continue  current dose of hydralazine and amlodipine at current dose.  - No ACE-I it increased his renal function in the past.  - I have asked him to wear compression stocking daily.  - Reinforced the need and importance of daily weights, a low sodium diet, and fluid restriction (less than 2 L a day). Instructed to call the HF clinic if weight increases more than 3 lbs overnight or 5 lbs in a week.  2. Afib/Flutter - s/p DC-CV 06/04/14. ICD interrogated when he was in short stay for blood 09/28/14 and no afib since August. On optivol appears to have small burst of fib around December 3rd which correlates with when he had a lot of PI events. Will continue amiodarone 200 mg BID. He will have PFTs.today. Educated to get yearly eye exams.  2) HTN - Improved. Continue current regimen. [name]  3) LVAD - LVAD parameters stable.  - Will do Intermacs data today including 6 min walk test. - Check CMET, INR, LDH, pro-BNP, prealbumin, LDH and Magnesium today.   4) CKD, stage III - Check CMET today.  5) Anticoagulation management: - Check INR today and HF pharmacy will address accordingly 6) Hematuria - Resolved and stent has been removed.  7) S/P Renal Stone lithotripsy- Per Dr Risa Grill.  Follow up in 8 weeks.    CLEGG,AMY  NP-C 1:41 PM  Patient seen and examined with Darrick Grinder, NP. We discussed all aspects of the encounter. I agree with the assessment and plan as stated above.   Doing well. Maintaining NSR on amio. VAD interrogation stable. ICD shows no AF or VT. Volume status looks good on Optivol and by exam. Check 6MW today and complete INTERMACs data.   Eshal Propps,MD 11:46 AM

## 2014-11-25 NOTE — Progress Notes (Signed)
Symptom  Yes  No  Details   Angina       x Activity:   Claudication        x How far:   Syncope        x When:     Stroke        x        TIA  2001  Orthopnea         x How many pillows: 1  PND         x How often:  CPAP       N/A How many hrs:   Pedal edema        x  Daily, usually resolves overnight (pt not wearing compression stockings); taking PRN lasix 1 x every 2 weeks for sustained pedal edema  Abd fullness               x    N&V        x         "gagging episodes" 1 - 2 x  weekly  Diaphoresis               x   Bleeding       x         occasional nosebleed when "blows his nose"  Urine color        yellow  SOB        x      Activity:  incline  Palpitations        x When:  ICD shock        x   Hospitlizaitons                 x When/where/why:   ED visit        x When/where/why:  Other MD             x   When/who/why:     Activity        Working 5 hrs/5 days week; walking 3 miles daily  Fluid    No limitations  < 2000   Diet    Low salt food   Vital signs: HR: 77 MAP BP:  90 O2 Sat:  99 Wt:  204 lbs  Last wt: 198   lbs Ht: 6'1"  LVAD interrogation reveals:  Speed:   9200 RPM Flow:  3.8 Power:   4.7 PI:  5.9 Alarms:  several low voltage advisories Events:  0 - 5 PI events; days with "gagging" episodes - @ 20 PI events Fixed speed:  9200 Low speed limit:  8600 Primary Controller:  Replace back up battery in 19 months  (Feb 2017) Back up controller:   Replace back up battery in 19 months (Feb 2017)  Drive line exit site well healed and incorporated. The velour is fully implanted at exit site. Dressing dry and intact. No erythema or drainage. Stabilization device present and accurately applied. Driveline dressing is being changed weekly per sterile technique using Sorbaview dressing with biopatch on exit site. Pt denies fever or chills. Pt provided with dressing supplies for home.   Patient/caregiver deny any alarms or VAD equipment issues. VAD coordinator reviewed  daily log from home for daily temperature, weight, and VAD parameters. Pt is performing daily controller and system monitor self tests along with completing weekly and monthly maintenance for LVAD equipment.  LVAD equipment check completed and is in good working order. Back-up equipment present. Emergency procedures and precautions reviewed.  Optivol interrogation completed and reviewed per Darrick Grinder, NP-C  Pt reported to Admissions/PFT lab; was told he would be "worked in"; encountered delay, requested to re-schedule his PFT's.    Annual preventive maintenance performed on patient's home equipment by [facility]).

## 2014-11-30 ENCOUNTER — Other Ambulatory Visit (HOSPITAL_COMMUNITY): Payer: Self-pay | Admitting: Cardiology

## 2014-11-30 MED ORDER — AMIODARONE HCL 200 MG PO TABS: 200.0000 mg | ORAL_TABLET | Freq: Two times a day (BID) | ORAL | Status: DC

## 2014-12-08 ENCOUNTER — Ambulatory Visit (HOSPITAL_COMMUNITY): Payer: Self-pay | Admitting: *Deleted

## 2014-12-08 ENCOUNTER — Other Ambulatory Visit (HOSPITAL_COMMUNITY): Payer: Self-pay | Admitting: Internal Medicine

## 2014-12-08 LAB — PROTIME-INR: INR: 3.6 — AB (ref <=1.1)

## 2014-12-09 ENCOUNTER — Ambulatory Visit (INDEPENDENT_AMBULATORY_CARE_PROVIDER_SITE_OTHER): Payer: Medicare Other | Admitting: Internal Medicine

## 2014-12-09 ENCOUNTER — Encounter: Payer: Self-pay | Admitting: Internal Medicine

## 2014-12-09 VITALS — HR 70 | Ht 72.0 in | Wt 205.4 lb

## 2014-12-09 DIAGNOSIS — Z4502 Encounter for adjustment and management of automatic implantable cardiac defibrillator: Secondary | ICD-10-CM

## 2014-12-09 DIAGNOSIS — I255 Ischemic cardiomyopathy: Secondary | ICD-10-CM

## 2014-12-09 DIAGNOSIS — I4729 Other ventricular tachycardia: Secondary | ICD-10-CM

## 2014-12-09 DIAGNOSIS — I472 Ventricular tachycardia, unspecified: Secondary | ICD-10-CM

## 2014-12-09 DIAGNOSIS — I4891 Unspecified atrial fibrillation: Secondary | ICD-10-CM

## 2014-12-09 DIAGNOSIS — R001 Bradycardia, unspecified: Secondary | ICD-10-CM

## 2014-12-09 DIAGNOSIS — I5022 Chronic systolic (congestive) heart failure: Secondary | ICD-10-CM

## 2014-12-09 DIAGNOSIS — I471 Supraventricular tachycardia: Secondary | ICD-10-CM

## 2014-12-09 LAB — MDC_IDC_ENUM_SESS_TYPE_INCLINIC
Battery Remaining Longevity: 100 mo
Battery Voltage: 3 V
Brady Statistic AP VP Percent: 0.37 %
Brady Statistic AS VP Percent: 0.35 %
Brady Statistic AS VS Percent: 5.85 %
Brady Statistic RA Percent Paced: 93.8 %
Brady Statistic RV Percent Paced: 0.72 %
Date Time Interrogation Session: 20160204091548
HIGH POWER IMPEDANCE MEASURED VALUE: 32 Ohm
HIGH POWER IMPEDANCE MEASURED VALUE: 44 Ohm
HighPow Impedance: 228 Ohm
Lead Channel Impedance Value: 589 Ohm
Lead Channel Pacing Threshold Pulse Width: 0.4 ms
Lead Channel Sensing Intrinsic Amplitude: 1.125 mV
Lead Channel Setting Pacing Amplitude: 1.75 V
Lead Channel Setting Pacing Amplitude: 2 V
Lead Channel Setting Pacing Pulse Width: 0.4 ms
Lead Channel Setting Sensing Sensitivity: 0.3 mV
MDC IDC MSMT LEADCHNL RA IMPEDANCE VALUE: 361 Ohm
MDC IDC MSMT LEADCHNL RA PACING THRESHOLD AMPLITUDE: 1 V
MDC IDC MSMT LEADCHNL RA PACING THRESHOLD PULSEWIDTH: 0.4 ms
MDC IDC MSMT LEADCHNL RV PACING THRESHOLD AMPLITUDE: 1 V
MDC IDC MSMT LEADCHNL RV SENSING INTR AMPL: 1.5 mV
MDC IDC SET ZONE DETECTION INTERVAL: 300 ms
MDC IDC SET ZONE DETECTION INTERVAL: 540 ms
MDC IDC STAT BRADY AP VS PERCENT: 93.44 %
Zone Setting Detection Interval: 330 ms
Zone Setting Detection Interval: 370 ms
Zone Setting Detection Interval: 430 ms

## 2014-12-09 LAB — PROTIME-INR
INR: 3.6 — ABNORMAL HIGH (ref 0.8–1.2)
Prothrombin Time: 40.1 s — ABNORMAL HIGH (ref 9.1–12.0)

## 2014-12-09 NOTE — Patient Instructions (Signed)
Your physician recommends that you continue on your current medications as directed. Please refer to the Current Medication list given to you today.  Remote monitoring is used to monitor your Pacemaker of ICD from home. This monitoring reduces the number of office visits required to check your device to one time per year. It allows [place] to keep an eye on the functioning of your device to ensure it is working properly. You are scheduled for a device check from home on 03/10/15. You may send your transmission at any time that day. If you have a wireless device, the transmission will be sent automatically. After your physician reviews your transmission, you will receive a postcard with your next transmission date.  Your physician wants you to follow-up in: 6 months with Dr. Caryl Comes. You will receive a reminder letter in the mail two months in advance. If you don't receive a letter, please call our office to schedule the follow-up appointment.

## 2014-12-09 NOTE — Progress Notes (Signed)
Patient Care Team: [name], MD as PCP - General (Family Medicine)   HPI  [name] is a 74 y.o. male Seen in followup for ventricular tachycardia occurring in the context of ischemic heart disease prior revascularization MI. He has had significant problems with ICD discharge and underwent catheter ablation November 2012. He underwent device revision with insertion of an atrial lead and generator replacement 7/14  He underwent repeat ablation August 2014 He was rendered noninducible at this procedure also. He has had recurrent ventricular tachycardia.  Catheterization was recently demonstrated two-vessel coronary disease with patent stents in the chronically totally occluded RCA ejection fraction of 25-30% Myoview scan demonstrated a large inferolateral infarct.  Echocardiogram 8/14 demonstrated EF of 15-20%   He underwent VAD insertion  He has had some mild worsening shortness of breath. He has some peripheral edema. He has had no ICD discharges    Past Medical History  Diagnosis Date  . Bradycardia   . Ventricular tachycardia     s/p AICD;   h/o ICD shock;  Amiodarone Rx. S/P ablation in 2012  . 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 HTN (hypertension)   . HLD (hyperlipidemia)   . Cytopenia   . Hypothyroidism   . Renal insufficiency   . Claudication   . Chronic systolic heart failure   . Ischemic cardiomyopathy     echo 7/12:  EF 25-30%, mild AI, mod MR, mod LAE  . TIA (transient ischemic attack) 2001  . CVD (cerebrovascular disease)     left CEA 2008  . Stroke   . Anemia   . Inappropriate therapy from implantable cardioverter-defibrillator 04/02/2013    ATP for sinus tach  SVT wavelet identified SVT but was no  passive   . Myocardial infarction 1992  . Anginal pain   . Automatic implantable cardioverter-defibrillator in  situ     Medtronic Evera device serial number [id number]    . Arthritis     "a little in my knees" (05/25/2013)  . Melanoma of ear 2013    "near left ear" (05/25/2013)  . 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 Dysrhythmia     HX A FIB  . History of nonmelanoma skin cancer     L EAR  . Kidney stone   . History of transfusion   . Sleep apnea     denies wearing mask (05/25/2013)  . CAD (coronary artery disease)     s/p MI 1982;  s/p DES to CFX 2011;  s/p NSTEMI 4/12 in setting of VTach;  cath 7/12:  LM ok, LAD mid 30-40%, Dx's with 40%, mCFX stent patent, prox to mid RCA occluded with R to R and L to R collats.  Medical Tx was continued  . CHF (congestive heart failure)     HAS VENTRICULAR PUMP    Past Surgical History  Procedure Laterality Date  . Femoral-popliteal bypass graft Right 2011  . Cardiac defibrillator placement  2003; 2005; 05/25/2013    2014: Medtronic Evera device serial number [id number]  . Revascularization / in-situ graft leg    . Renal artery bypass Bilateral 2003  . Back surgery    . Lumbar disc surgery  1974; 2000's  . Knee arthroscopy Bilateral 1990's  . Elbow  arthroscopy Right 1990's  . Cholecystectomy  12/15/?2010  . Lipoma excision Left 2013    "near ear" (05/25/2013)  . Heel spur surgery Right 1990's  . Cataract extraction w/ intraocular lens  implant, bilateral Bilateral 2000  . Carotid endarterectomy Left ~ 2007  . Doppler echocardiography  2011  . Tonsillectomy  1946  . Femoral-popliteal bypass graft Left 05/2002    Archie Endo 05/06/2002 (05/25/2013)  . Tumor excision Left 1960's    "fatty tumor" (05/25/2013)  . Cardiac electrophysiology study and ablation  2012  . Coronary angioplasty  1992  . Cardiac catheterization      "several" (05/25/2013)  . Coronary angioplasty with stent placement      "last one was 11/2012  (05/25/2013)  . Cardioversion N/A 10/15/2013    Procedure: CARDIOVERSION;  Surgeon: Jolaine Artist, MD;  Location: [facility];  Service:  Cardiovascular;  Laterality: N/A;  . Insertion of implantable left ventricular assist device N/A 10/19/2013    Procedure: INSERTION OF IMPLANTABLE LEFT VENTRICULAR ASSIST DEVICE;  Surgeon: Gaye Pollack, MD;  Location: [place];  Service: Open Heart Surgery;  Laterality: N/A;  CIRC ARREST  NITRIC OXIDE  MEDTRONIC ICD  . Intraoperative transesophageal echocardiogram N/A 10/19/2013    Procedure: INTRAOPERATIVE TRANSESOPHAGEAL ECHOCARDIOGRAM;  Surgeon: Gaye Pollack, MD;  Location: [facility] OR;  Service: Open Heart Surgery;  Laterality: N/A;  . Tee without cardioversion N/A 11/04/2013    Procedure: TRANSESOPHAGEAL ECHOCARDIOGRAM (TEE);  Surgeon: Larey Dresser, MD;  Location: [facility];  Service: Cardiovascular;  Laterality: N/A;  . Cardioversion N/A 11/04/2013    Procedure: CARDIOVERSION;  Surgeon: Larey Dresser, MD;  Location: [place];  Service: Cardiovascular;  Laterality: N/A;  . Cardioversion N/A 06/04/2014    Procedure: CARDIOVERSION;  Surgeon: Jolaine Artist, MD;  Location: [facility] ENDOSCOPY;  Service: Cardiovascular;  Laterality: N/A;  . Cystoscopy with retrograde pyelogram, ureteroscopy and stent placement Left 08/09/2014    Procedure: [place], URETEROSCOPY AND STENT PLACEMENT;  Surgeon: Bernestine Amass, MD;  Location: [facility] ORS;  Service: Urology;  Laterality: Left;  . Cystoscopy with retrograde pyelogram, ureteroscopy and stent placement Left 09/01/2014    Procedure: CYSTOSCOPY WITH URETEROSCOPY AND STENT REMOVAL;  Surgeon: Bernestine Amass, MD;  Location: [facility] ORS;  Service: Urology;  Laterality: Left;  . Holmium laser application Left 78/29/5621    Procedure: HOLMIUM LASER APPLICATION;  Surgeon: Bernestine Amass, MD;  Location: [facility]S;  Service: Urology;  Laterality: Left;  . Cystoscopy with retrograde pyelogram, ureteroscopy and stent placement Left 09/20/2014    Procedure: CYSTOSCOPY WITH RETROGRADE PYELOGRAM, URETEROSCOPY AND STENT PLACEMENT, stone removal;   Surgeon: Bernestine Amass, MD;  Location: [facility];  Service: Urology;  Laterality: Left;  Stephanie Coup ablation N/A 09/12/2011    Procedure: V-TACH ABLATION;  Surgeon: Evans Lance, MD;  Location: [facility] CATH LAB;  Service: Cardiovascular;  Laterality: N/A;  . Left heart catheterization with coronary angiogram N/A 11/24/2012    Procedure: LEFT HEART CATHETERIZATION WITH CORONARY ANGIOGRAM;  Surgeon: Peter M Martinique, MD;  Location: [facility] CATH LAB;  Service: Cardiovascular;  Laterality: N/A;  . Implantable cardioverter defibrillator generator change N/A 05/25/2013    Procedure: IMPLANTABLE CARDIOVERTER DEFIBRILLATOR GENERATOR CHANGE;  Surgeon: Deboraha Sprang, MD;  Location: [facility] CATH LAB;  Service: Cardiovascular;  Laterality: N/A;  . Lead revision N/A 06/05/2013    Procedure: LEAD REVISION;  Surgeon: Thompson Grayer, MD;  Location: [facility] CATH LAB;  Service: Cardiovascular;  Laterality: N/A;  . V-tach ablation  N/A 06/08/2013    Procedure: V-TACH ABLATION;  Surgeon: Evans Lance, MD;  Location: [facility] CATH LAB;  Service: Cardiovascular;  Laterality: N/A;  . Right heart catheterization N/A 09/17/2013    Procedure: RIGHT HEART CATH;  Surgeon: Jolaine Artist, MD;  Location: [facility] CATH LAB;  Service: Cardiovascular;  Laterality: N/A;  . Right heart catheterization N/A 10/14/2013    Procedure: RIGHT HEART CATH;  Surgeon: Jolaine Artist, MD;  Location: [facility] CATH LAB;  Service: Cardiovascular;  Laterality: N/A;  . Right heart catheterization N/A 11/16/2013    Procedure: RIGHT HEART CATH;  Surgeon: Jolaine Artist, MD;  Location: [facility] CATH LAB;  Service: Cardiovascular;  Laterality: N/A;  . Right heart catheterization N/A 07/13/2014    Procedure: RIGHT HEART CATH;  Surgeon: Jolaine Artist, MD;  Location: [facility] CATH LAB;  Service: Cardiovascular;  Laterality: N/A;    Current Outpatient Prescriptions  Medication Sig Dispense Refill  . amiodarone (PACERONE) 200 MG tablet Take 1 tablet (200 mg total) by mouth 2 (two) times daily. 60  tablet 6  . amLODipine (NORVASC) 10 MG tablet Take 10 mg by mouth every evening.    [name] aspirin 81 MG tablet Take 81 mg by mouth every morning.     [name] atorvastatin (LIPITOR) 80 MG tablet Take 80 mg by mouth every morning.    . docusate sodium (COLACE) 100 MG capsule Take 200 mg by mouth every morning.    . ferrous gluconate (FERGON) 324 MG tablet Take 1 tablet (324 mg total) by mouth 3 (three) times daily with meals. 90 tablet 6  . furosemide (LASIX) 20 MG tablet Take 20 mg by mouth daily as needed for fluid.     . hydrALAZINE (APRESOLINE) 25 MG tablet Take 1.5 tablets (37.5 mg total) by mouth 3 (three) times daily. 150 tablet 6  . HYDROcodone-acetaminophen (NORCO/VICODIN) 5-325 MG per tablet Take 1-2 tablets by mouth every 6 (six) hours as needed. (Patient not taking: Reported on 10/14/2014) 20 tablet 0  . levothyroxine (SYNTHROID, LEVOTHROID) 150 MCG tablet Take 1 tablet (150 mcg total) by mouth daily. 30 tablet 3  . magnesium gluconate (MAGONATE) 500 MG tablet Take 1,000 mg by mouth daily.    . Multiple Vitamin (MULTIVITAMIN) tablet Take 1 tablet by mouth at bedtime.     . Multiple Vitamins-Minerals (ICAPS PO) Take 1 tablet by mouth 2 (two) times daily.    . nitroGLYCERIN (NITROSTAT) 0.4 MG SL tablet Place 0.4 mg under the tongue every 5 (five) minutes as needed for chest pain.     . pantoprazole (PROTONIX) 40 MG tablet TAKE 1 TABLET (40 MG TOTAL) BY MOUTH DAILY. 30 tablet 6  . ranolazine (RANEXA) 500 MG 12 hr tablet Take 500 mg by mouth 2 (two) times daily.    [name] warfarin (COUMADIN) 2 MG tablet Take 2-4 mg by mouth at bedtime. Takes 2mg  daily, except takes 4mg  on Mondays and Fridays.    [name] zinc gluconate 50 MG tablet Take 100 mg by mouth daily.      No current facility-administered medications for this visit.    Allergies  Allergen Reactions  . Demerol [Meperidine] Other (See Comments)    Paralysis. Could only move eyes.  . Meperidine Hcl Other (See Comments)    "CAN'T MOVE" CATATONIC PER  PT.  [name] Opium Other (See Comments)    "CAN'T MOVE" CATATONIC PER PT.    Review of Systems negative except from HPI and PMH  Physical Exam Ht 6' (1.829 m)  Wt 205 lb 6.4 oz (93.169 kg)  BMI 27.85 kg/m2 Well developed and well nourished in no acute distress HENT normal E scleral and icterus clear Neck Supple JVP8-10 ; carotids brisk and full Clear to ausculation  Regular rate and rhythm, LVAD whirring  Soft with active bowel sounds No clubbing cyanosis  1-2+Edema Alert and oriented, grossly normal motor and sensory function Skin Warm and Dry    Assessment and  Plan  CHF -acute chronic systolic   ICD-Medtronic The patient's device was interrogated.  The information was reviewed. No changes were made in the programming.   '  LVAD  ISCHEMIC CARDIOMYOPATHY  TIA/CVA  VT  AFib  Loss of R waves-ICD  Treated hypothyroidism   He continues to do well. His R waves are diminished but stable. He is holding sinus rhythm. There is evidence of volume overload. I will have him increase his diuretics for the next 3 days.  His last TSH was markedly elevated. Synthroid was increased. We will check his TSH today on the higher dose.

## 2014-12-10 ENCOUNTER — Encounter (HOSPITAL_COMMUNITY): Payer: Self-pay | Admitting: Infectious Diseases

## 2014-12-14 ENCOUNTER — Encounter: Payer: Self-pay | Admitting: Internal Medicine

## 2014-12-14 ENCOUNTER — Telehealth (HOSPITAL_COMMUNITY): Payer: Self-pay | Admitting: Infectious Diseases

## 2014-12-14 NOTE — Telephone Encounter (Signed)
Called [name] regarding his upcoming trip to [facility]. With his permission we have sent his most recent clinic note to the VAD Team at [facility] and informed them of his travel date. Information provided to [name] and let him know that we would also be sending him a letter for reference in the event he needs to reach out to their team for medical support.  [facility] [facility] [address]  [place], [place] [address] [facility] Contact: [phone]  Get directions (87.10mi)

## 2014-12-16 ENCOUNTER — Encounter: Payer: Self-pay | Admitting: Internal Medicine

## 2014-12-16 ENCOUNTER — Other Ambulatory Visit: Payer: Self-pay | Admitting: Internal Medicine

## 2014-12-16 ENCOUNTER — Other Ambulatory Visit (HOSPITAL_COMMUNITY): Payer: Self-pay | Admitting: Internal Medicine

## 2014-12-17 ENCOUNTER — Ambulatory Visit (HOSPITAL_COMMUNITY): Payer: Self-pay | Admitting: Infectious Diseases

## 2014-12-17 DIAGNOSIS — Z95811 Presence of heart assist device: Secondary | ICD-10-CM

## 2014-12-17 DIAGNOSIS — Z7901 Long term (current) use of anticoagulants: Secondary | ICD-10-CM

## 2014-12-17 LAB — TSH: TSH: 11.44 u[IU]/mL — ABNORMAL HIGH (ref 0.450–4.500)

## 2014-12-17 LAB — PROTIME-INR
INR: 1.6 — AB (ref 0.8–1.2)
INR: 1.6 — AB (ref <=1.1)
Prothrombin Time: 16.7 s — ABNORMAL HIGH (ref 9.1–12.0)

## 2014-12-17 MED ORDER — ENOXAPARIN SODIUM 40 MG/0.4ML ~~LOC~~ SOLN
90.0000 mg | Freq: Two times a day (BID) | SUBCUTANEOUS | Status: DC
Start: 2014-12-17 — End: 2015-02-17

## 2014-12-17 NOTE — Progress Notes (Signed)
1. Instructed [name] to start Lovenox 90 mg injections BID today for [name].  2. Take 4 mg Coumadin today, 4 mg Coumadin tomorrow. Change maintenance dosing to 2 mg QD except 4 mg on Monday and Friday.  3. Handwritten prescription for follow up INR 12/21/14 faxed to [place] work per her request at [phone] 4. Upcoming trip to [facility] [place] next week (2/15-2/19). INR planned to recheck Tuesday at   [place] [address], [place] [address]  Phone #: [phone] Fax: [phone]

## 2014-12-22 ENCOUNTER — Other Ambulatory Visit (HOSPITAL_COMMUNITY): Payer: Self-pay | Admitting: Internal Medicine

## 2014-12-22 LAB — PROTIME-INR
INR: 3.3 — AB (ref <=1.1)
INR: 3.3 — AB (ref <=1.1)
INR: 3.3 — AB (ref <=1.1)
INR: 3.3 — AB (ref <=1.1)

## 2014-12-23 ENCOUNTER — Ambulatory Visit (HOSPITAL_COMMUNITY): Payer: Self-pay | Admitting: *Deleted

## 2014-12-23 ENCOUNTER — Telehealth (HOSPITAL_COMMUNITY): Payer: Self-pay | Admitting: *Deleted

## 2014-12-23 LAB — PROTIME-INR
INR: 3.3 — AB (ref 0.8–1.2)
Prothrombin Time: 34.3 s — ABNORMAL HIGH (ref 9.1–12.0)

## 2014-12-23 MED ORDER — LEVOFLOXACIN 750 MG PO TABS: 750.0000 mg | ORAL_TABLET | Freq: Every day | ORAL | Status: DC

## 2014-12-23 NOTE — Telephone Encounter (Signed)
[name] called from beach; pt still c/o sinus infection symptoms; completed Cefdinir 300 mg bid x 10 days yesterday. Dr. Haroldine Laws updated - Rx for Levoflox called to local [facility].

## 2014-12-30 ENCOUNTER — Other Ambulatory Visit (HOSPITAL_COMMUNITY): Payer: Self-pay | Admitting: Internal Medicine

## 2014-12-30 LAB — PROTIME-INR: INR: 2.2 — AB (ref <=1.1)

## 2014-12-31 ENCOUNTER — Telehealth (HOSPITAL_COMMUNITY): Payer: Self-pay | Admitting: Infectious Diseases

## 2014-12-31 ENCOUNTER — Ambulatory Visit (HOSPITAL_COMMUNITY): Payer: Self-pay | Admitting: Infectious Diseases

## 2014-12-31 DIAGNOSIS — E039 Hypothyroidism, unspecified: Secondary | ICD-10-CM

## 2014-12-31 LAB — PROTIME-INR
INR: 2.2 — ABNORMAL HIGH (ref 0.8–1.2)
PROTHROMBIN TIME: 22.9 s — AB (ref 9.1–12.0)

## 2014-12-31 MED ORDER — LEVOTHYROXINE SODIUM 175 MCG PO TABS: 175.0000 ug | ORAL_TABLET | Freq: Every day | ORAL | Status: DC

## 2014-12-31 NOTE — Telephone Encounter (Signed)
Called patient regarding elevated TSH. Per Dr. Caryl Comes will increase the Synthroid dose to 175 mcg daily and to please follow up with his PCP.

## 2014-12-31 NOTE — Telephone Encounter (Signed)
-----   Message from Scarlette Calico, RN sent at 12/31/2014 10:53 AM EST -----   ----- Message -----    From: Deboraha Sprang, MD    Sent: 12/30/2014  11:36 AM      To: Jolaine Artist, MD, Stanton Kidney, RN, #  Please Inform Patient that mild abnormality in TSH is evident  We will increase his synthroid 150-`175 abnd have his followup with his PCP Thanks

## 2015-01-12 ENCOUNTER — Other Ambulatory Visit (HOSPITAL_COMMUNITY): Payer: Self-pay | Admitting: Internal Medicine

## 2015-01-13 LAB — PROTIME-INR
INR: 2.1 — AB (ref 0.8–1.2)
PROTHROMBIN TIME: 21.8 s — AB (ref 9.1–12.0)

## 2015-01-14 ENCOUNTER — Telehealth (HOSPITAL_COMMUNITY): Payer: Self-pay | Admitting: *Deleted

## 2015-01-14 ENCOUNTER — Ambulatory Visit (HOSPITAL_COMMUNITY): Payer: Self-pay | Admitting: *Deleted

## 2015-01-17 ENCOUNTER — Telehealth (HOSPITAL_COMMUNITY): Payer: Self-pay | Admitting: *Deleted

## 2015-01-17 NOTE — Telephone Encounter (Signed)
[name] called to report elevated flow and power VAD parameters this am.  Flow 9.0 with power 8.9. Pt asymptomatic from heart failure symptoms, urine remains clear, no VAD alarms. Asked her to re-check VAD parameters and call VAD pager (Dr. Haroldine Laws is carrying this weekend) to update Dr. Haroldine Laws. Per Dr. Haroldine Laws, he instructed [name] to continue monitoring VAD parameters and if #'s remain elevated/rise, pt becomes symptomatic from heart failure symptoms, or urine becomes darker, she is to bring Mr. Shirk to ED.  Confirmed with [name] this is the plan, I will call Monday for update and plan on seeing pt in clinic if needed.

## 2015-01-17 NOTE — Telephone Encounter (Signed)
Called [name] to check on [name]; wife reports his VAD parameters returned to baseline after Friday. No c/o heart failure symptoms. Pt will f/u 01/27/15 clinic visit; will call if any issues arise prior to clinic visit.

## 2015-01-21 ENCOUNTER — Encounter: Payer: Self-pay | Admitting: Internal Medicine

## 2015-01-25 ENCOUNTER — Telehealth (HOSPITAL_COMMUNITY): Payer: Self-pay | Admitting: *Deleted

## 2015-01-25 MED ORDER — PROMETHAZINE HCL 12.5 MG PO TABS: 12.5000 mg | ORAL_TABLET | Freq: Four times a day (QID) | ORAL | Status: DC | PRN

## 2015-01-25 NOTE — Telephone Encounter (Signed)
[name] called to report pt and herself came down with "stomach bug" Sunday night with vomiting/diarrhea. She is better and back at work today, but [name] continues to have vomiting/diarrhea. He is keeping his meds down along with some clear fluids. Denies fever, chills, or VAD alarms. We will cancel his Thursday PFT's, but plan on keeping VAD clinic appt. Dr. Aundra Dubin updated - will send Rx for Phenergan. Othelia Pulling to call VAD pager if symptoms worsen or do not improve. [name] verbalized understanding of same.

## 2015-01-27 ENCOUNTER — Other Ambulatory Visit (HOSPITAL_COMMUNITY): Payer: Self-pay | Admitting: Infectious Diseases

## 2015-01-27 ENCOUNTER — Ambulatory Visit (HOSPITAL_COMMUNITY): Payer: Self-pay | Admitting: Infectious Diseases

## 2015-01-27 ENCOUNTER — Encounter (HOSPITAL_COMMUNITY): Payer: BC Managed Care – PPO

## 2015-01-27 ENCOUNTER — Ambulatory Visit (HOSPITAL_COMMUNITY)
Admission: RE | Admit: 2015-01-27 | Discharge: 2015-01-27 | Disposition: A | Discharge status: Home / Self Care | Payer: Medicare Other | Source: Ambulatory Visit | Attending: Internal Medicine | Admitting: Internal Medicine

## 2015-01-27 ENCOUNTER — Encounter (HOSPITAL_COMMUNITY): Payer: Self-pay

## 2015-01-27 VITALS — BP 84/0 | HR 69 | Wt 196.8 lb

## 2015-01-27 DIAGNOSIS — I5022 Chronic systolic (congestive) heart failure: Secondary | ICD-10-CM | POA: Insufficient documentation

## 2015-01-27 DIAGNOSIS — I4892 Unspecified atrial flutter: Secondary | ICD-10-CM | POA: Diagnosis not present

## 2015-01-27 DIAGNOSIS — E039 Hypothyroidism, unspecified: Secondary | ICD-10-CM | POA: Insufficient documentation

## 2015-01-27 DIAGNOSIS — Z95811 Presence of heart assist device: Secondary | ICD-10-CM | POA: Insufficient documentation

## 2015-01-27 DIAGNOSIS — Z7901 Long term (current) use of anticoagulants: Secondary | ICD-10-CM

## 2015-01-27 DIAGNOSIS — E785 Hyperlipidemia, unspecified: Secondary | ICD-10-CM | POA: Diagnosis not present

## 2015-01-27 DIAGNOSIS — I4891 Unspecified atrial fibrillation: Secondary | ICD-10-CM | POA: Insufficient documentation

## 2015-01-27 DIAGNOSIS — G4730 Sleep apnea, unspecified: Secondary | ICD-10-CM | POA: Diagnosis not present

## 2015-01-27 DIAGNOSIS — I480 Paroxysmal atrial fibrillation: Secondary | ICD-10-CM | POA: Diagnosis not present

## 2015-01-27 DIAGNOSIS — Z7982 Long term (current) use of aspirin: Secondary | ICD-10-CM | POA: Diagnosis not present

## 2015-01-27 DIAGNOSIS — N183 Chronic kidney disease, stage 3 (moderate): Secondary | ICD-10-CM | POA: Insufficient documentation

## 2015-01-27 DIAGNOSIS — I129 Hypertensive chronic kidney disease with stage 1 through stage 4 chronic kidney disease, or unspecified chronic kidney disease: Secondary | ICD-10-CM | POA: Insufficient documentation

## 2015-01-27 DIAGNOSIS — Z8673 Personal history of transient ischemic attack (TIA), and cerebral infarction without residual deficits: Secondary | ICD-10-CM | POA: Diagnosis not present

## 2015-01-27 DIAGNOSIS — Z79899 Other long term (current) drug therapy: Secondary | ICD-10-CM | POA: Insufficient documentation

## 2015-01-27 DIAGNOSIS — I10 Essential (primary) hypertension: Secondary | ICD-10-CM | POA: Diagnosis not present

## 2015-01-27 DIAGNOSIS — I255 Ischemic cardiomyopathy: Secondary | ICD-10-CM | POA: Insufficient documentation

## 2015-01-27 LAB — BASIC METABOLIC PANEL
Anion gap: 6 (ref 5–15)
BUN: 16 mg/dL (ref 6–23)
CALCIUM: 8.8 mg/dL (ref 8.4–10.5)
CO2: 28 mmol/L (ref 19–32)
Chloride: 104 mmol/L (ref 96–112)
Creatinine, Ser: 1.46 mg/dL — ABNORMAL HIGH (ref 0.50–1.35)
GFR, EST AFRICAN AMERICAN: 52 mL/min — AB (ref >90)
GFR, EST NON AFRICAN AMERICAN: 45 mL/min — AB (ref >90)
GLUCOSE: 107 mg/dL — AB (ref 70–99)
Potassium: 3.7 mmol/L (ref 3.5–5.1)
SODIUM: 138 mmol/L (ref 135–145)

## 2015-01-27 LAB — PROTIME-INR
INR: 2.29 — ABNORMAL HIGH (ref 0.00–1.49)
PROTHROMBIN TIME: 25.4 s — AB (ref 11.6–15.2)

## 2015-01-27 LAB — CBC
HEMATOCRIT: 34.9 % — AB (ref 39.0–52.0)
Hemoglobin: 11.2 g/dL — ABNORMAL LOW (ref 13.0–17.0)
MCH: 31.1 pg (ref 26.0–34.0)
MCHC: 32.1 g/dL (ref 30.0–36.0)
MCV: 96.9 fL (ref 78.0–100.0)
Platelets: 124 10*3/uL — ABNORMAL LOW (ref 150–400)
RBC: 3.6 MIL/uL — AB (ref 4.22–5.81)
RDW: 15.7 % — ABNORMAL HIGH (ref 11.5–15.5)
WBC: 4.9 10*3/uL (ref 4.0–10.5)

## 2015-01-27 LAB — LACTATE DEHYDROGENASE: LDH: 258 U/L — ABNORMAL HIGH (ref 94–250)

## 2015-01-27 NOTE — Progress Notes (Signed)
Symptom  Yes  No  Details   Angina       x Activity:   Claudication        x How far:   Syncope        x When:     Stroke        x       TIA  2001  Orthopnea         x How many pillows: 1  PND         x How often:  CPAP       N/A How many hrs:   Pedal edema        x  Daily, usually resolves overnight (pt not wearing compression stockings); taking PRN lasix 1 x every 2 weeks for sustained pedal edema  Abd fullness               x    N&V        x         Recently he and his wife have had the GI but with vomiting, nausea and diarrhea Monday through "gagging episodes" 1 - 2 x  weekly  Diaphoresis               x   Bleeding              x  nosebleeds resolved most part   Urine color        x  yellow  SOB        x      Activity:  incline  Palpitations        x When:  ICD shock        x   Hospitlizaitons                 x When/where/why:   ED visit        x When/where/why:  Other MD             x   When/who/why:     Activity        Working 5 hrs/5 days week; walking 3 miles daily  Fluid    No limitations  < 2000   Diet    Low salt food   Vital signs: HR: 69 MAP BP:  84 O2 Sat:  96 Wt:  196.8 lbs  Last wt: 204 lbs Ht: 6'1"  LVAD interrogation reveals:  Speed:   9200 RPM Flow:  --- to 3.3 Power: 4.5 - 4.6 (one isolated power elevation in 01/14/15 where it was >7. See Molly's note).  PI: 3.6 - 4.0 Alarms: occasional low voltage advisories Events:  Many PI events especially over the last few days with the N/V with GI illness. Several days with "gagging" episodes - @ 20 PI events Fixed speed:  9200 Low speed limit:  8600 Primary Controller:  Replace back up battery in 19 months  (Feb 2017) Back up controller:   Replace back up battery in 19 months (Feb 2017)  Mr. Loomer and his wife come to clinic today for a 2 month follow up. Recent GI illness (N/V/D) Sunday--feeling better today, still with decreased appetite but no vomiting or nausea since taking Phenergan.   Drive line exit  site well healed and incorporated. The velour is fully implanted at exit site. Dressing dry and intact. No erythema or drainage. Stabilization device present and accurately applied. Driveline dressing is being changed weekly per sterile technique using Sorbaview dressing with biopatch  on exit site. Pt denies fever or chills. Report enough dressing supplies at home.   I reviewed the LVAD parameters from today, and compared the results to the patient's prior recorded data. No programming changes were made. The LVAD is functioning within specified parameters.  The patient performs LVAD self-test daily.  LVAD interrogation was negative for any significant power changes, some low voltage alarms and averages up to 10 - 20 PI events daily with outliers > 30 on 01/12/15 and 01/23/15. He does not report being symptomatic for these events and his GI illness/vomiting explains the recent days; however increased amounts from previous interrogations. Dr. Haroldine Laws would like a RAMP echo done at the next visit in 1 month to reassess speed.   Drive line exit site well healed and incorporated. The velour is fully implanted at exit site. Dressing dry and intact. No erythema or drainage. Stabilization device present and accurately applied. Driveline dressing is being changed weekly per sterile technique using Sorbaview dressing with biopatch on exit site. Pt denies fever or chills. Report enough dressing supplies at home.   Patient/caregiver deny any alarms or VAD equipment issues. VAD coordinator reviewed daily log from home for daily temperature, weight, and VAD parameters. Pt is performing daily controller and system monitor self tests along with completing weekly and monthly maintenance for LVAD equipment.   Cancelled PFTs this visit d/t not feeling 100% after illness. Will reschedule them for next visit in 4 weeks.

## 2015-01-27 NOTE — Addendum Note (Signed)
Encounter addended by: Jolaine Artist, MD on: 01/27/2015  4:46 PM<BR>     Documentation filed: Charges VN

## 2015-01-27 NOTE — Patient Instructions (Addendum)
1. INR is good today 2.29. Next check on 02/10/15 please  2. Follow up with [place] in 1 month at the clinic  3. Next visit we will schedule your breathing tests (PFTs) and will get you set up for an echocardiogram (heart ultrasound) to check on your PI events.  4. Glad you are feeling better and nice to meet you both!!

## 2015-01-27 NOTE — Progress Notes (Signed)
Patient ID: [name], male   DOB: July 01, 1941, 74 y.o.   MRN: [id number]  PCP: Dr. Karolee Stamps ([place])  Nekoda Chock is a 74 yo male with h/o VT, PAD and severe systolic HF (EF 81-44%) due to mixed ischemic/nonischemic CM he is s/p HM II LVAD implant for destination therapy on October 19, 2013. On 09/01/14 underwent cystoscopy and flexible ureteroscopy with lithotripsy and basket extraction.   VAD implantation was complicated by VT, syncope, A fib/Aflutter and persistent low output felt to be due to RHF. RHC showed low cardiac output but no evidence of RV failure on Milrinone. He was felt to be volume depleted. He was discharged home on 11/18/13 on mirlinone and PRN lasix 20 mg for a weight of 200 pounds or greater. Milrinone has since been weaned off.   RHC (07/13/14): Well compensated hemodynamics with VAD support.  Admitted 11/9-11/19/15 for hematuria and chronic nephrolithiasis. Underwent Echo to assess RV for concern RV failure because he presented with SOB. RV looked good and had renal US showed mild hyronephrosis and bilateral renal cysts. Dr. Risa Grill took back for repeat lithotripsy and stone extraction by ureteroscopy and J stent placement. Received total of 3 PRBCs. D/C weight 188 lbs.   Follow up for Heart Failure/LVAD: Continues to do well. However earlier this week had stomach but with n/v. Now better. Weight at home 193-195 pounds. Taking lasix once every 2 weeks. No BRBPR. No longer having hematuria. Denies SOB/PND/Orthopnea. Able to walk several miles a daily.  Following a low salt diet and drinking less than 2L a day.   Denies LVAD alarms.  Denies driveline trauma, erythema or drainage.  Denies ICD shocks.    Reports taking Coumadin as prescribed and adherence to anticoagulation based dietary restrictions.  Denies bright red blood per rectum or melena, no dark urine or hematuria.     Past Medical History  Diagnosis Date  . Bradycardia   . Ventricular tachycardia      s/p AICD;   h/o ICD shock;  Amiodarone Rx. S/P ablation in 2012  . 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 HTN (hypertension)   . HLD (hyperlipidemia)   . Cytopenia   . Hypothyroidism   . Renal insufficiency   . Claudication   . Chronic systolic heart failure   . Ischemic cardiomyopathy     echo 7/12:  EF 25-30%, mild AI, mod MR, mod LAE  . TIA (transient ischemic attack) 2001  . CVD (cerebrovascular disease)     left CEA 2008  . Stroke   . Anemia   . Inappropriate therapy from implantable cardioverter-defibrillator 04/02/2013    ATP for sinus tach  SVT wavelet identified SVT but was no  passive   . Myocardial infarction 1992  . Anginal pain   . Automatic implantable cardioverter-defibrillator in situ     Medtronic Evera device serial number [id number]    . Arthritis     "a little in my knees" (05/25/2013)  . Melanoma of ear 2013    "near left ear" (05/25/2013)  . 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 Dysrhythmia     HX A FIB  . History of nonmelanoma skin cancer     L EAR  . Kidney stone   . History of transfusion   .  Sleep apnea     denies wearing mask (05/25/2013)  . CAD (coronary artery disease)     s/p MI 1982;  s/p DES to CFX 2011;  s/p NSTEMI 4/12 in setting of VTach;  cath 7/12:  LM ok, LAD mid 30-40%, Dx's with 40%, mCFX stent patent, prox to mid RCA occluded with R to R and L to R collats.  Medical Tx was continued  . CHF (congestive heart failure)     HAS VENTRICULAR PUMP    Current Outpatient Prescriptions  Medication Sig Dispense Refill  . amiodarone (PACERONE) 200 MG tablet Take 1 tablet (200 mg total) by mouth 2 (two) times daily. 60 tablet 6  . amLODipine (NORVASC) 10 MG tablet Take 10 mg by mouth every evening.    [name] aspirin 81 MG tablet Take 81 mg by mouth every morning.     [name] atorvastatin (LIPITOR) 80 MG tablet Take 80  mg by mouth every morning.    . docusate sodium (COLACE) 100 MG capsule Take 200 mg by mouth every morning.    . ferrous gluconate (FERGON) 324 MG tablet Take 1 tablet (324 mg total) by mouth 3 (three) times daily with meals. 90 tablet 6  . furosemide (LASIX) 20 MG tablet Take 20 mg by mouth daily as needed for fluid.     . hydrALAZINE (APRESOLINE) 25 MG tablet Take 1.5 tablets (37.5 mg total) by mouth 3 (three) times daily. 150 tablet 6  . levothyroxine (SYNTHROID, LEVOTHROID) 175 MCG tablet Take 1 tablet (175 mcg total) by mouth daily before breakfast. 30 tablet 6  . magnesium gluconate (MAGONATE) 500 MG tablet Take 1,000 mg by mouth daily.    . Multiple Vitamin (MULTIVITAMIN) tablet Take 1 tablet by mouth at bedtime.     . Multiple Vitamins-Minerals (ICAPS PO) Take 1 tablet by mouth 2 (two) times daily.    . nitroGLYCERIN (NITROSTAT) 0.4 MG SL tablet Place 0.4 mg under the tongue every 5 (five) minutes as needed for chest pain.     . pantoprazole (PROTONIX) 40 MG tablet TAKE 1 TABLET (40 MG TOTAL) BY MOUTH DAILY. 30 tablet 6  . promethazine (PHENERGAN) 12.5 MG tablet Take 1 tablet (12.5 mg total) by mouth every 6 (six) hours as needed for nausea or vomiting. 30 tablet 0  . ranolazine (RANEXA) 500 MG 12 hr tablet Take 500 mg by mouth 2 (two) times daily.    [name] warfarin (COUMADIN) 2 MG tablet Take 2-4 mg by mouth at bedtime. Takes 2mg  daily, except takes 4mg  on Mondays and Fridays.    [name] enoxaparin (LOVENOX) 40 MG/0.4ML injection Inject 0.9 mLs (90 mg total) into the skin 2 (two) times daily. Inject into the skin of your abdomen twice a day. (Patient not taking: Reported on 01/27/2015) 10 Syringe 1  . zinc gluconate 50 MG tablet Take 100 mg by mouth daily.      No current facility-administered medications for this encounter.    Demerol; Meperidine hcl; and Opium  REVIEW OF SYSTEMS: All systems negative except as listed in HPI, PMH and Problem list.  LVAD INTERROGATION:   HeartMate II LVAD:    Speed: 9200 Flow 3.9  liters/min Power 4.9 PI 7.0 Alarms: none Events: PI events 10-15 PI events Fixed speed: 9200 Low speed limit:  8600 Primary Controller: Replace back up battery (Feb 2017) Back up controller: Replace back up battery (Feb 2017)  I reviewed the LVAD parameters from today, and compared the results to the patient's prior recorded data.  Increased set speed to 9200 and back-up speed to 8600. The LVAD is functioning within specified parameters.  The patient performs LVAD self-test daily.  LVAD interrogation was negative for any significant power changes, alarms or PI events/speed drops.  LVAD equipment check completed and is in good working order.  Back-up equipment present.   LVAD education done on emergency procedures and precautions and reviewed exit site care.    Filed Vitals:   01/27/15 1110  BP: 84/0  Pulse: 69  Weight: 196 lb 12.8 oz (89.268 kg)  SpO2: 98%    Physical Exam: GENERAL: Well appearing, male who presents to clinic today in no acute distress. Wife present HEENT: normal  NECK: Supple, JVP 7.  2+ bilaterally, no bruits.  No lymphadenopathy or thyromegaly appreciated.   CARDIAC:  Mechanical heart sounds with LVAD hum present.  LUNGS:  Clear to auscultation bilaterally.  ABDOMEN:  Soft, round, nontender, positive bowel sounds x4.     LVAD exit site: well-healed and incorporated.  Dressing dry and intact.  No erythema or drainage.  Stabilization device present and accurately applied.  Driveline dressing is being changed weekly per sterile technique. EXTREMITIES:  Warm and dry, no cyanosis, clubbing, bilateral  Trace bilateral edema NEUROLOGIC:  Alert and oriented x 4.  Gait steady.  No aphasia.  No dysarthria.  Affect pleasant.      ASSESSMENT AND PLAN:   1. Chronic systolic HF: s/p LVAD 42/8768 - NYHA II symptoms and volume status looks good. Continue lasix PRN.  - ICD interrogated personally: No crossings over threshold; patient activity ~3  hrs a day.  Close to 100% of BiV pacing.  - He is not on a BB with history of RV failure.  - MAP ok today. Continue current dose of hydralazine and amlodipine at current dose.  - No ACE-I it increased his renal function in the past.  - I have asked him to wear compression stocking daily.  - Reinforced the need and importance of daily weights, a low sodium diet, and fluid restriction (less than 2 L a day). Instructed to call the HF clinic if weight increases more than 3 lbs overnight or 5 lbs in a week.  2. Afib/Flutter - s/p DC-CV 06/04/14. ICD interrogated when he was in short stay for blood 09/28/14 and no afib since August. On optivol appears to have small burst of fib around December 3rd which correlates with when he had a lot of PI events. Will continue amiodarone 200 mg BID. He will have PFTs.today. Educated to get yearly eye exams.  2) HTN - Improved. Continue current regimen. [name]  3) LVAD - LVAD parameters stable.  - Multiple PI events - will plan ramp echo next visit - Check CMET, INR, LDH, pro-BNP, prealbumin, LDH and Magnesium today.   4) CKD, stage III - Check CMET today.  5) Anticoagulation management: - Check INR today and HF pharmacy will address accordingly 6) Hematuria - Resolved and stent has been removed.  7) S/P Renal Stone lithotripsy- Per Dr Risa Grill.    Glori Bickers  MD  1:08 PM

## 2015-02-01 ENCOUNTER — Encounter (HOSPITAL_COMMUNITY): Payer: Self-pay | Admitting: *Deleted

## 2015-02-07 ENCOUNTER — Other Ambulatory Visit (HOSPITAL_COMMUNITY): Payer: Self-pay | Admitting: Adult Health

## 2015-02-08 ENCOUNTER — Other Ambulatory Visit (HOSPITAL_COMMUNITY): Payer: Self-pay | Admitting: Internal Medicine

## 2015-02-09 LAB — PROTIME-INR
INR: 2.5 — ABNORMAL HIGH (ref 0.8–1.2)
Prothrombin Time: 27.4 s — ABNORMAL HIGH (ref 9.1–12.0)

## 2015-02-10 ENCOUNTER — Ambulatory Visit: Payer: Self-pay | Admitting: *Deleted

## 2015-02-11 ENCOUNTER — Encounter (HOSPITAL_COMMUNITY): Payer: Self-pay

## 2015-02-11 ENCOUNTER — Ambulatory Visit (HOSPITAL_BASED_OUTPATIENT_CLINIC_OR_DEPARTMENT_OTHER)
Admission: RE | Admit: 2015-02-11 | Discharge: 2015-02-11 | Disposition: A | Discharge status: Home / Self Care | Payer: Medicare Other | Source: Ambulatory Visit | Attending: Cardiology | Admitting: Cardiology

## 2015-02-11 ENCOUNTER — Telehealth (HOSPITAL_COMMUNITY): Payer: Self-pay | Admitting: Infectious Diseases

## 2015-02-11 VITALS — BP 90/0 | HR 69 | Temp 97.2°F

## 2015-02-11 DIAGNOSIS — Z95810 Presence of automatic (implantable) cardiac defibrillator: Secondary | ICD-10-CM | POA: Insufficient documentation

## 2015-02-11 DIAGNOSIS — M170 Bilateral primary osteoarthritis of knee: Secondary | ICD-10-CM | POA: Insufficient documentation

## 2015-02-11 DIAGNOSIS — R6883 Chills (without fever): Secondary | ICD-10-CM | POA: Insufficient documentation

## 2015-02-11 DIAGNOSIS — D500 Iron deficiency anemia secondary to blood loss (chronic): Secondary | ICD-10-CM | POA: Diagnosis present

## 2015-02-11 DIAGNOSIS — R531 Weakness: Secondary | ICD-10-CM | POA: Diagnosis not present

## 2015-02-11 DIAGNOSIS — Z8673 Personal history of transient ischemic attack (TIA), and cerebral infarction without residual deficits: Secondary | ICD-10-CM | POA: Insufficient documentation

## 2015-02-11 DIAGNOSIS — I739 Peripheral vascular disease, unspecified: Secondary | ICD-10-CM

## 2015-02-11 DIAGNOSIS — I255 Ischemic cardiomyopathy: Secondary | ICD-10-CM | POA: Insufficient documentation

## 2015-02-11 DIAGNOSIS — I4891 Unspecified atrial fibrillation: Secondary | ICD-10-CM | POA: Insufficient documentation

## 2015-02-11 DIAGNOSIS — E785 Hyperlipidemia, unspecified: Secondary | ICD-10-CM

## 2015-02-11 DIAGNOSIS — G4730 Sleep apnea, unspecified: Secondary | ICD-10-CM | POA: Insufficient documentation

## 2015-02-11 DIAGNOSIS — I252 Old myocardial infarction: Secondary | ICD-10-CM | POA: Insufficient documentation

## 2015-02-11 DIAGNOSIS — Z79899 Other long term (current) drug therapy: Secondary | ICD-10-CM

## 2015-02-11 DIAGNOSIS — I2510 Atherosclerotic heart disease of native coronary artery without angina pectoris: Secondary | ICD-10-CM | POA: Insufficient documentation

## 2015-02-11 DIAGNOSIS — I5022 Chronic systolic (congestive) heart failure: Secondary | ICD-10-CM | POA: Diagnosis present

## 2015-02-11 DIAGNOSIS — Z95811 Presence of heart assist device: Secondary | ICD-10-CM

## 2015-02-11 DIAGNOSIS — I10 Essential (primary) hypertension: Secondary | ICD-10-CM

## 2015-02-11 DIAGNOSIS — R5383 Other fatigue: Secondary | ICD-10-CM | POA: Insufficient documentation

## 2015-02-11 DIAGNOSIS — N289 Disorder of kidney and ureter, unspecified: Secondary | ICD-10-CM

## 2015-02-11 DIAGNOSIS — N200 Calculus of kidney: Secondary | ICD-10-CM | POA: Diagnosis present

## 2015-02-11 DIAGNOSIS — Q2733 Arteriovenous malformation of digestive system vessel: Principal | ICD-10-CM

## 2015-02-11 DIAGNOSIS — I480 Paroxysmal atrial fibrillation: Secondary | ICD-10-CM | POA: Diagnosis present

## 2015-02-11 DIAGNOSIS — E039 Hypothyroidism, unspecified: Secondary | ICD-10-CM | POA: Insufficient documentation

## 2015-02-11 DIAGNOSIS — Z87891 Personal history of nicotine dependence: Secondary | ICD-10-CM

## 2015-02-11 DIAGNOSIS — Z7901 Long term (current) use of anticoagulants: Secondary | ICD-10-CM | POA: Insufficient documentation

## 2015-02-11 DIAGNOSIS — Z955 Presence of coronary angioplasty implant and graft: Secondary | ICD-10-CM

## 2015-02-11 DIAGNOSIS — Z9181 History of falling: Secondary | ICD-10-CM

## 2015-02-11 DIAGNOSIS — R0981 Nasal congestion: Secondary | ICD-10-CM

## 2015-02-11 DIAGNOSIS — R509 Fever, unspecified: Secondary | ICD-10-CM | POA: Diagnosis not present

## 2015-02-11 DIAGNOSIS — R0602 Shortness of breath: Secondary | ICD-10-CM | POA: Diagnosis not present

## 2015-02-11 LAB — CBC
HCT: 22.8 % — ABNORMAL LOW (ref 39.0–52.0)
Hemoglobin: 7.3 g/dL — ABNORMAL LOW (ref 13.0–17.0)
MCH: 31.3 pg (ref 26.0–34.0)
MCHC: 32 g/dL (ref 30.0–36.0)
MCV: 97.9 fL (ref 78.0–100.0)
PLATELETS: 199 10*3/uL (ref 150–400)
RBC: 2.33 MIL/uL — ABNORMAL LOW (ref 4.22–5.81)
RDW: 17.8 % — AB (ref 11.5–15.5)
WBC: 7 10*3/uL (ref 4.0–10.5)

## 2015-02-11 MED ORDER — AMOXICILLIN-POT CLAVULANATE 875-125 MG PO TABS: 1.0000 | ORAL_TABLET | Freq: Two times a day (BID) | ORAL | Status: DC

## 2015-02-11 NOTE — Telephone Encounter (Signed)
Called regarding warfarin and augmentin. After discussing with pharm-d, we will plan to have him check his INR again next Wednesday to determine any changes to Coumadin while taking Augmentin. Verbalized understanding.

## 2015-02-11 NOTE — Progress Notes (Addendum)
[name] presents today with a 3 day history of nasal congestion, chills/hot flashes, limited hearing out right ear, dizziness with fall this morning (no major injury aside from some skin tears that are bleeding on the right forearm, witnessed by his wife). Was able to work Wednesday all day however Thursday he had to leave work early due to extreme fatigue, dizziness and inability to walk. He is accompanied by his wife and is in a wheelchair today. Has been taking Allegra for the allergies but nothing for the nasal congestion. Normal gagging that he gets, no nausea or vomiting. Denies any productive coughing or chest congestion.   LVAD Interrogation: Speed: 9200 Flow: 4.5 PI: 3.9 - 6.7  Power: 5.2  Alarms: none Events: 46 PI events yesterday, normally 10 - 20 daily with frequent gagging episodes.   RAMP Echo to be scheduled with next VAD clinic visit 02/25/15 to evaluate with increased PI events.  Augmentin 875/125 x 14 d per [name]. Interaction with warfarin causing potentially increased INRs. D/W [name], AHF Pharm D and will plan on keeping dose regimen the same for now and will have him check his INR next Wednesday. 02/16/15.  Patient ID: [name], male   DOB: Nov 10, 1940, 74 y.o.   MRN: [id number]  PCP: [name] ([place])  [name] is a 74 yo male with h/o VT, PAD and severe systolic HF (EF 35-36%) due to mixed ischemic/nonischemic CM he is s/p HM II LVAD implant for destination therapy on October 19, 2013. On 09/01/14 underwent cystoscopy and flexible ureteroscopy with lithotripsy and basket extraction.   VAD implantation was complicated by VT, syncope, A fib/Aflutter and persistent low output felt to be due to RHF. RHC showed low cardiac output but no evidence of RV failure on Milrinone. He was felt to be volume depleted. He was discharged home on 11/18/13 on milrinone and PRN lasix 20 mg for a weight of 200 pounds or greater. Milrinone has since been  weaned off.   RHC (07/13/14): Well compensated hemodynamics with VAD support.  Admitted 11/9-11/19/15 for hematuria and chronic nephrolithiasis. Underwent Echo to assess RV for concern RV failure because he presented with SOB. RV looked good and had renal US showed mild hyronephrosis and bilateral renal cysts. [name] took back for repeat lithotripsy and stone extraction by ureteroscopy and J stent placement. Received total of 3 PRBCs. D/C weight 188 lbs.   Follow up for Heart Failure/LVAD: Patient seen with VAD nurse today.  He was worked in for weakness, congestion, facial pain, and "stopped up" feeling in right ear. "Dizzy" this morning and fell. Has dark stool but no change in this since he has been on iron.  No fever but subjective chills.   Denies LVAD alarms.  Denies driveline trauma, erythema or drainage.  Denies ICD shocks.    Reports taking Coumadin as prescribed and adherence to anticoagulation based dietary restrictions.     Past Medical History  Diagnosis Date  . Bradycardia   . Ventricular tachycardia     s/p AICD;   h/o ICD shock;  Amiodarone Rx. S/P ablation in 2012  . 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 HTN (hypertension)   . HLD (hyperlipidemia)   . Cytopenia   . Hypothyroidism   . Renal insufficiency   .  Claudication   . Chronic systolic heart failure   . Ischemic cardiomyopathy     echo 7/12:  EF 25-30%, mild AI, mod MR, mod LAE  . TIA (transient ischemic attack) 2001  . CVD (cerebrovascular disease)     left CEA 2008  . Stroke   . Anemia   . Inappropriate therapy from implantable cardioverter-defibrillator 04/02/2013    ATP for sinus tach  SVT wavelet identified SVT but was no  passive   . Myocardial infarction 1992  . Anginal pain   . Automatic implantable cardioverter-defibrillator in situ     Medtronic Evera device  serial number [id number]    . Arthritis     "a little in my knees" (05/25/2013)  . Melanoma of ear 2013    "near left ear" (05/25/2013)  . 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 Dysrhythmia     HX A FIB  . History of nonmelanoma skin cancer     L EAR  . Kidney stone   . History of transfusion   . Sleep apnea     denies wearing mask (05/25/2013)  . CAD (coronary artery disease)     s/p MI 1982;  s/p DES to CFX 2011;  s/p NSTEMI 4/12 in setting of VTach;  cath 7/12:  LM ok, LAD mid 30-40%, Dx's with 40%, mCFX stent patent, prox to mid RCA occluded with R to R and L to R collats.  Medical Tx was continued  . CHF (congestive heart failure)     HAS VENTRICULAR PUMP    No current facility-administered medications for this encounter.   Current Outpatient Prescriptions  Medication Sig Dispense Refill  . amiodarone (PACERONE) 200 MG tablet Take 1 tablet (200 mg total) by mouth 2 (two) times daily. 60 tablet 6  . amLODipine (NORVASC) 10 MG tablet Take 10 mg by mouth every evening.    [name] amoxicillin-clavulanate (AUGMENTIN) 875-125 MG per tablet Take 1 tablet by mouth 2 (two) times daily. 28 tablet 0  . aspirin 81 MG tablet Take 81 mg by mouth every morning.     [name] atorvastatin (LIPITOR) 80 MG tablet Take 80 mg by mouth every morning.    . docusate sodium (COLACE) 100 MG capsule Take 200 mg by mouth every morning.    . enoxaparin (LOVENOX) 40 MG/0.4ML injection Inject 0.9 mLs (90 mg total) into the skin 2 (two) times daily. Inject into the skin of your abdomen twice a day. (Patient not taking: Reported on 01/27/2015) 10 Syringe 1  . ferrous gluconate (FERGON) 324 MG tablet Take 1 tablet (324 mg total) by mouth 3 (three) times daily with meals. 90 tablet 6  . furosemide (LASIX) 20 MG tablet Take 20 mg by mouth daily as needed for fluid.     . hydrALAZINE (APRESOLINE) 25 MG tablet Take 1.5 tablets (37.5 mg total) by mouth 3 (three) times daily. 150 tablet 6  . levothyroxine (SYNTHROID, LEVOTHROID) 175  MCG tablet Take 1 tablet (175 mcg total) by mouth daily before breakfast. 30 tablet 6  . magnesium gluconate (MAGONATE) 500 MG tablet Take 1,000 mg by mouth daily.    . Multiple Vitamin (MULTIVITAMIN) tablet Take 1 tablet by mouth at bedtime.     . Multiple Vitamins-Minerals (ICAPS PO) Take 1 tablet by mouth 2 (two) times daily.    . nitroGLYCERIN (NITROSTAT) 0.4 MG SL tablet Place 0.4 mg under the tongue every 5 (five) minutes as needed for chest pain.     [name]  pantoprazole (PROTONIX) 40 MG tablet TAKE 1 TABLET (40 MG TOTAL) BY MOUTH DAILY. 30 tablet 6  . promethazine (PHENERGAN) 12.5 MG tablet Take 1 tablet (12.5 mg total) by mouth every 6 (six) hours as needed for nausea or vomiting. 30 tablet 0  . RANEXA 500 MG 12 hr tablet TAKE 1 TABLET (500 MG TOTAL) BY MOUTH 2 (TWO) TIMES DAILY. 60 tablet 6  . ranolazine (RANEXA) 500 MG 12 hr tablet Take 500 mg by mouth 2 (two) times daily.    [name] warfarin (COUMADIN) 2 MG tablet Take 2-4 mg by mouth at bedtime. Takes 2mg  daily, except takes 4mg  on Mondays and Fridays.    [name] zinc gluconate 50 MG tablet Take 100 mg by mouth daily.      Facility-Administered Medications Ordered in Other Encounters  Medication Dose Route Frequency Provider Last Rate Last Dose  . 0.9 %  sodium chloride infusion  10 mL/hr Intravenous Once Wandra Arthurs, MD      . 0.9 %  sodium chloride infusion   Intravenous Once Larey Dresser, MD        Demerol; Meperidine hcl; and Opium  REVIEW OF SYSTEMS: All systems negative except as listed in HPI, PMH and Problem list.  LVAD INTERROGATION:   HeartMate II LVAD:   Speed: 9200 Flow 3.9  liters/min Power 4.9 PI 7.0 Alarms: none Events: PI events 10-15 PI events Fixed speed: 9200 Low speed limit:  8600 Primary Controller: Replace back up battery (Feb 2017) Back up controller: Replace back up battery (Feb 2017)  I reviewed the LVAD parameters from today, and compared the results to the patient's prior recorded data.  Increased set  speed to 9200 and back-up speed to 8600. The LVAD is functioning within specified parameters.  The patient performs LVAD self-test daily.  LVAD interrogation was negative for any significant power changes, alarms or PI events/speed drops.  LVAD equipment check completed and is in good working order.  Back-up equipment present.   LVAD education done on emergency procedures and precautions and reviewed exit site care.    Filed Vitals:   02/11/15 1123  BP: 90/0  Pulse: 69  Temp: 97.2 F (36.2 C)  SpO2: 99%    Physical Exam: GENERAL: Well appearing, male who presents to clinic today in no acute distress. Wife present HEENT: Tenderness over right maxillary sinus.  NECK: Supple, JVP 8-9.  2+ bilaterally, no bruits.  No lymphadenopathy or thyromegaly appreciated.   CARDIAC:  Mechanical heart sounds with LVAD hum present.  LUNGS:  Clear to auscultation bilaterally.  ABDOMEN:  Soft, round, nontender, positive bowel sounds x4.     LVAD exit site: well-healed and incorporated.  Dressing dry and intact.  No erythema or drainage.  Stabilization device present and accurately applied.  Driveline dressing is being changed weekly per sterile technique. EXTREMITIES:  Warm and dry, no cyanosis, clubbing.  1+ edema 1/2 up lower legs bilaterally.  NEUROLOGIC:  Alert and oriented x 4.  Gait steady.  No aphasia.  No dysarthria.  Affect pleasant.      ASSESSMENT AND PLAN: Acute work-in today for weakness, congestion, facial pain, dizziness.  MAP is 90.  Possible sinusitis with congestion, right maxillary sinus tenderness.  He is afebrile.  Increased PI events may be due to dehydration though some elevation in JVP speaks against this.   - Will treat empirically with Augmentin for possible acute sinusitis.  - Send CBC.   Loralie Champagne  MD

## 2015-02-11 NOTE — Patient Instructions (Addendum)
1. Start Augmentin antibiotic twice a day for 14 days. Take all of the pills with food to minimize stomach upset. Call us back if your symptoms get worse or do not improve after 3 days of taking it.  2. Will call you today after discussing with the pharmacist about your coumadin. Plan on checking INR next week.  3. OK to take: Coricidin, Flonase, Saline nose spray.

## 2015-02-12 ENCOUNTER — Inpatient Hospital Stay (HOSPITAL_COMMUNITY)
Admission: EM | Admit: 2015-02-12 | Discharge: 2015-02-17 | DRG: 988 | Disposition: A | Discharge status: Home / Self Care | Payer: Medicare Other | Attending: Cardiology | Admitting: Cardiology

## 2015-02-12 ENCOUNTER — Encounter (HOSPITAL_COMMUNITY): Payer: Self-pay | Admitting: Emergency Medicine

## 2015-02-12 ENCOUNTER — Other Ambulatory Visit (HOSPITAL_COMMUNITY): Payer: Self-pay

## 2015-02-12 ENCOUNTER — Emergency Department (HOSPITAL_COMMUNITY): Payer: Medicare Other

## 2015-02-12 DIAGNOSIS — K922 Gastrointestinal hemorrhage, unspecified: Secondary | ICD-10-CM | POA: Diagnosis present

## 2015-02-12 DIAGNOSIS — E039 Hypothyroidism, unspecified: Secondary | ICD-10-CM | POA: Diagnosis present

## 2015-02-12 DIAGNOSIS — Z7901 Long term (current) use of anticoagulants: Secondary | ICD-10-CM | POA: Diagnosis not present

## 2015-02-12 DIAGNOSIS — E785 Hyperlipidemia, unspecified: Secondary | ICD-10-CM | POA: Diagnosis present

## 2015-02-12 DIAGNOSIS — I739 Peripheral vascular disease, unspecified: Secondary | ICD-10-CM | POA: Diagnosis present

## 2015-02-12 DIAGNOSIS — G4730 Sleep apnea, unspecified: Secondary | ICD-10-CM | POA: Diagnosis present

## 2015-02-12 DIAGNOSIS — Z79899 Other long term (current) drug therapy: Secondary | ICD-10-CM | POA: Diagnosis not present

## 2015-02-12 DIAGNOSIS — I252 Old myocardial infarction: Secondary | ICD-10-CM | POA: Diagnosis not present

## 2015-02-12 DIAGNOSIS — Z955 Presence of coronary angioplasty implant and graft: Secondary | ICD-10-CM | POA: Diagnosis not present

## 2015-02-12 DIAGNOSIS — Z95810 Presence of automatic (implantable) cardiac defibrillator: Secondary | ICD-10-CM | POA: Diagnosis not present

## 2015-02-12 DIAGNOSIS — Z95811 Presence of heart assist device: Secondary | ICD-10-CM

## 2015-02-12 DIAGNOSIS — R0600 Dyspnea, unspecified: Secondary | ICD-10-CM

## 2015-02-12 DIAGNOSIS — Z9181 History of falling: Secondary | ICD-10-CM | POA: Diagnosis not present

## 2015-02-12 DIAGNOSIS — K2961 Other gastritis with bleeding: Secondary | ICD-10-CM | POA: Diagnosis not present

## 2015-02-12 DIAGNOSIS — R0602 Shortness of breath: Secondary | ICD-10-CM | POA: Diagnosis present

## 2015-02-12 DIAGNOSIS — Z87891 Personal history of nicotine dependence: Secondary | ICD-10-CM | POA: Diagnosis not present

## 2015-02-12 DIAGNOSIS — I255 Ischemic cardiomyopathy: Secondary | ICD-10-CM | POA: Diagnosis present

## 2015-02-12 DIAGNOSIS — I480 Paroxysmal atrial fibrillation: Secondary | ICD-10-CM | POA: Diagnosis present

## 2015-02-12 DIAGNOSIS — D500 Iron deficiency anemia secondary to blood loss (chronic): Secondary | ICD-10-CM | POA: Diagnosis present

## 2015-02-12 DIAGNOSIS — Z8673 Personal history of transient ischemic attack (TIA), and cerebral infarction without residual deficits: Secondary | ICD-10-CM | POA: Diagnosis not present

## 2015-02-12 DIAGNOSIS — K274 Chronic or unspecified peptic ulcer, site unspecified, with hemorrhage: Secondary | ICD-10-CM | POA: Diagnosis not present

## 2015-02-12 DIAGNOSIS — I2510 Atherosclerotic heart disease of native coronary artery without angina pectoris: Secondary | ICD-10-CM | POA: Diagnosis present

## 2015-02-12 DIAGNOSIS — I5022 Chronic systolic (congestive) heart failure: Secondary | ICD-10-CM | POA: Diagnosis present

## 2015-02-12 DIAGNOSIS — Q2733 Arteriovenous malformation of digestive system vessel: Secondary | ICD-10-CM | POA: Diagnosis not present

## 2015-02-12 DIAGNOSIS — N200 Calculus of kidney: Secondary | ICD-10-CM | POA: Diagnosis present

## 2015-02-12 LAB — COMPREHENSIVE METABOLIC PANEL
ALT: 18 U/L (ref 0–53)
AST: 25 U/L (ref 0–37)
Albumin: 3 g/dL — ABNORMAL LOW (ref 3.5–5.2)
Alkaline Phosphatase: 70 U/L (ref 39–117)
Anion gap: 7 (ref 5–15)
BUN: 38 mg/dL — ABNORMAL HIGH (ref 6–23)
CHLORIDE: 106 mmol/L (ref 96–112)
CO2: 24 mmol/L (ref 19–32)
Calcium: 8.4 mg/dL (ref 8.4–10.5)
Creatinine, Ser: 1.71 mg/dL — ABNORMAL HIGH (ref 0.50–1.35)
GFR calc non Af Amer: 37 mL/min — ABNORMAL LOW (ref >90)
GFR, EST AFRICAN AMERICAN: 43 mL/min — AB (ref >90)
GLUCOSE: 127 mg/dL — AB (ref 70–99)
Potassium: 4.5 mmol/L (ref 3.5–5.1)
Sodium: 137 mmol/L (ref 135–145)
Total Bilirubin: 0.4 mg/dL (ref 0.3–1.2)
Total Protein: 5.1 g/dL — ABNORMAL LOW (ref 6.0–8.3)

## 2015-02-12 LAB — I-STAT CHEM 8, ED
BUN: 38 mg/dL — AB (ref 6–23)
CREATININE: 1.6 mg/dL — AB (ref 0.50–1.35)
Calcium, Ion: 1.17 mmol/L (ref 1.13–1.30)
Chloride: 104 mmol/L (ref 96–112)
GLUCOSE: 122 mg/dL — AB (ref 70–99)
HCT: 16 % — ABNORMAL LOW (ref 39.0–52.0)
Hemoglobin: 5.4 g/dL — CL (ref 13.0–17.0)
Potassium: 4.4 mmol/L (ref 3.5–5.1)
SODIUM: 138 mmol/L (ref 135–145)
TCO2: 21 mmol/L (ref 0–100)

## 2015-02-12 LAB — CBC WITH DIFFERENTIAL/PLATELET
Basophils Absolute: 0.1 10*3/uL (ref 0.0–0.1)
Basophils Relative: 1 % (ref 0–1)
Eosinophils Absolute: 0.1 10*3/uL (ref 0.0–0.7)
Eosinophils Relative: 2 % (ref 0–5)
HEMATOCRIT: 16.7 % — AB (ref 39.0–52.0)
HEMOGLOBIN: 5.5 g/dL — AB (ref 13.0–17.0)
Lymphocytes Relative: 17 % (ref 12–46)
Lymphs Abs: 1.2 10*3/uL (ref 0.7–4.0)
MCH: 31.9 pg (ref 26.0–34.0)
MCHC: 31.1 g/dL (ref 30.0–36.0)
MCV: 102.5 fL — ABNORMAL HIGH (ref 78.0–100.0)
Monocytes Absolute: 1 10*3/uL (ref 0.1–1.0)
Monocytes Relative: 14 % — ABNORMAL HIGH (ref 3–12)
Neutro Abs: 4.8 10*3/uL (ref 1.7–7.7)
Neutrophils Relative %: 67 % (ref 43–77)
Platelets: 169 10*3/uL (ref 150–400)
RBC: 1.63 MIL/uL — AB (ref 4.22–5.81)
RDW: 19.6 % — AB (ref 11.5–15.5)
WBC: 7.1 10*3/uL (ref 4.0–10.5)

## 2015-02-12 LAB — PREPARE RBC (CROSSMATCH)

## 2015-02-12 LAB — LACTATE DEHYDROGENASE: LDH: 216 U/L (ref 94–250)

## 2015-02-12 LAB — I-STAT TROPONIN, ED: TROPONIN I, POC: 0.04 ng/mL (ref 0.00–0.08)

## 2015-02-12 LAB — POC OCCULT BLOOD, ED: FECAL OCCULT BLD: POSITIVE — AB

## 2015-02-12 LAB — PROTIME-INR
INR: 2.75 — AB (ref 0.00–1.49)
Prothrombin Time: 29.3 seconds — ABNORMAL HIGH (ref 11.6–15.2)

## 2015-02-12 MED ORDER — SODIUM CHLORIDE 0.9 % IV SOLN: 10.0000 mL/h | Freq: Once | INTRAVENOUS | Status: DC

## 2015-02-12 MED ORDER — SODIUM CHLORIDE 0.9 % IV SOLN: Freq: Once | INTRAVENOUS | Status: DC

## 2015-02-12 NOTE — ED Notes (Signed)
Dr Aundra Dubin  [phone] he will see in 30 minutes

## 2015-02-12 NOTE — Progress Notes (Deleted)
Patient ID: [name], male   DOB: 03-12-1941, 74 y.o.   MRN: [id number]

## 2015-02-12 NOTE — ED Notes (Signed)
Main lab notified to ad L[facility], states will ad, spoke to [place]

## 2015-02-12 NOTE — ED Notes (Addendum)
Roommate at bedside, Alen Blew at [phone], has healthcare power of attorney-Copy at bedside. Called for updates.

## 2015-02-12 NOTE — Progress Notes (Addendum)
Called by Jilda Roche VAD coordinator to notify pt was coming in to ED for weakness.  Pt states that he has been weak for several days & was seen in the clinic yesterday.  He had labs.  Hgb 7.5 yesterday & over 11 on 3/24.  Pt states he has been having dark stools but he takes iron & did not notice a change.  He also states he has had several PI alarms today.  Labs drawn as well as a type & cross.  Dr. Aundra Dubin updated regarding pt arrival & on his way in, orders rcvd.[name]

## 2015-02-12 NOTE — H&P (Signed)
VAD TEAM History & Physical Note   Reason for Admission:    HPI:   [name] is a 74 yo male with h/o VT, PAD and severe systolic HF (EF 28-78%) due to mixed ischemic/nonischemic cardiomyopathy.  He is s/p HM II LVAD implant for destination therapy on October 19, 2013. On 09/01/14 underwent cystoscopy and flexible ureteroscopy with lithotripsy and basket extraction.   VAD implantation was complicated by VT, syncope, A fib/Aflutter and persistent low output felt to be due to RHF. RHC showed low cardiac output but no evidence of RV failure on Milrinone. He was felt to be volume depleted. He was discharged home on 11/18/13 on milrinone and PRN lasix 20 mg for a weight of 200 pounds or greater. Milrinone has since been weaned off.   RHC (07/13/14): Well compensated hemodynamics with VAD support.  Admitted 11/9-11/19/15 for hematuria and chronic nephrolithiasis. Underwent Echo to assess RV for concern RV failure because he presented with SOB. RV looked good and had renal US showed mild hyronephrosis and bilateral renal cysts. Dr. Risa Grill took back for repeat lithotripsy and stone extraction by ureteroscopy and J stent placement. Received total of 3 PRBCs. D/C weight 188 lbs.   He had been doing well until around Wednesday when he gradually developed weakness.  He also had upper respiratory congestion.  He fell on Thursday and was seen in [place] clinic on Friday.  There was concern for URI/acute sinusitis with pain to palpation over right maxillary sinus.  He was afebrile.  He was started on Augmentin.  Today, he continued to decline and was very weak.  2 more falls.  Some nausea with "dry heaves."  Not lightheaded per se.  He was short of breath walking around his house.  He came to the ER, hemoglobin was found to be 5.4.  MAP 78.  HR 69, regular probably junctional rhythm.  CXR with no PNA.    He has no history of GI bleeding. 2 wks ago, hemoglobin was 11.2.  He states that his stool is  always dark due to iron and has not changed recently.   FOBT +.    LVAD INTERROGATION:  HeartMate II LVAD:  Flow 5.0 liters/min, speed 9200, power 5.5, PI 6.4, 11 PI events today.     Review of Systems: All systems reviewed and negative except as per HPI.   Home Medications Prior to Admission medications   Medication Sig Start Date End Date Taking? Authorizing Provider  amiodarone (PACERONE) 200 MG tablet Take 1 tablet (200 mg total) by mouth 2 (two) times daily. 11/30/14  Yes Shaune Pascal Bensimhon, MD  amLODipine (NORVASC) 10 MG tablet Take 10 mg by mouth every evening.   Yes Historical Provider, MD  amoxicillin-clavulanate (AUGMENTIN) 875-125 MG per tablet Take 1 tablet by mouth 2 (two) times daily. 02/11/15  Yes Larey Dresser, MD  aspirin 81 MG tablet Take 81 mg by mouth every morning.    Yes Historical Provider, MD  atorvastatin (LIPITOR) 80 MG tablet Take 80 mg by mouth every morning.   Yes Historical Provider, MD  docusate sodium (COLACE) 100 MG capsule Take 200 mg by mouth every morning.   Yes Historical Provider, MD  ferrous gluconate (FERGON) 324 MG tablet Take 1 tablet (324 mg total) by mouth 3 (three) times daily with meals. 06/07/14  Yes Jolaine Artist, MD  furosemide (LASIX) 20 MG tablet Take 20 mg by mouth daily as needed for fluid.  Yes Historical Provider, MD  hydrALAZINE (APRESOLINE) 25 MG tablet Take 1.5 tablets (37.5 mg total) by mouth 3 (three) times daily. 10/14/14  Yes Rande Brunt, NP  levothyroxine (SYNTHROID, LEVOTHROID) 175 MCG tablet Take 1 tablet (175 mcg total) by mouth daily before breakfast. 12/31/14  Yes Deboraha Sprang, MD  magnesium gluconate (MAGONATE) 500 MG tablet Take 1,000 mg by mouth daily.   Yes Historical Provider, MD  Multiple Vitamin (MULTIVITAMIN) tablet Take 1 tablet by mouth at bedtime.    Yes Historical Provider, MD  Multiple Vitamins-Minerals (ICAPS PO) Take 1 tablet by mouth 2 (two) times daily.   Yes Historical Provider, MD  nitroGLYCERIN  (NITROSTAT) 0.4 MG SL tablet Place 0.4 mg under the tongue every 5 (five) minutes as needed for chest pain.    Yes Historical Provider, MD  pantoprazole (PROTONIX) 40 MG tablet TAKE 1 TABLET (40 MG TOTAL) BY MOUTH DAILY. 09/01/14  Yes Jolaine Artist, MD  promethazine (PHENERGAN) 12.5 MG tablet Take 1 tablet (12.5 mg total) by mouth every 6 (six) hours as needed for nausea or vomiting. 01/25/15  Yes Larey Dresser, MD  RANEXA 500 MG 12 hr tablet TAKE 1 TABLET (500 MG TOTAL) BY MOUTH 2 (TWO) TIMES DAILY. 02/07/15  Yes Larey Dresser, MD  ranolazine (RANEXA) 500 MG 12 hr tablet Take 500 mg by mouth 2 (two) times daily.   Yes Historical Provider, MD  warfarin (COUMADIN) 2 MG tablet Take 2-4 mg by mouth at bedtime. Takes 2mg  daily, except takes 4mg  on Monday, Wednesday and  Friday.   Yes Historical Provider, MD  zinc gluconate 50 MG tablet Take 100 mg by mouth daily.    Yes Historical Provider, MD  enoxaparin (LOVENOX) 40 MG/0.4ML injection Inject 0.9 mLs (90 mg total) into the skin 2 (two) times daily. Inject into the skin of your abdomen twice a day. Patient not taking: Reported on 01/27/2015 12/17/14   Frank Andrews, NP    Past Medical History: Past Medical History  Diagnosis Date  . Bradycardia   . Ventricular tachycardia     s/p AICD;   h/o ICD shock;  Amiodarone Rx. S/P ablation in 2012  . 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 HTN (hypertension)   . HLD (hyperlipidemia)   . Cytopenia   . Hypothyroidism   . Renal insufficiency   . Claudication   . Chronic systolic heart failure   . Ischemic cardiomyopathy     echo 7/12:  EF 25-30%, mild AI, mod MR, mod LAE  . TIA (transient ischemic attack) 2001  . CVD (cerebrovascular disease)     left CEA 2008  . Stroke   . Anemia   . Inappropriate therapy from implantable cardioverter-defibrillator 04/02/2013    ATP  for sinus tach  SVT wavelet identified SVT but was no  passive   . Myocardial infarction 1992  . Anginal pain   . Automatic implantable cardioverter-defibrillator in situ     Medtronic Evera device serial number [id number]    . Arthritis     "a little in my knees" (05/25/2013)  . Melanoma of ear 2013    "near left ear" (05/25/2013)  . 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 Dysrhythmia     HX A FIB  . History  of nonmelanoma skin cancer     L EAR  . Kidney stone   . History of transfusion   . Sleep apnea     denies wearing mask (05/25/2013)  . CAD (coronary artery disease)     s/p MI 1982;  s/p DES to CFX 2011;  s/p NSTEMI 4/12 in setting of VTach;  cath 7/12:  LM ok, LAD mid 30-40%, Dx's with 40%, mCFX stent patent, prox to mid RCA occluded with R to R and L to R collats.  Medical Tx was continued  . CHF (congestive heart failure)     HAS VENTRICULAR PUMP    Past Surgical History: Past Surgical History  Procedure Laterality Date  . Femoral-popliteal bypass graft Right 2011  . Cardiac defibrillator placement  2003; 2005; 05/25/2013    2014: Medtronic Evera device serial number [id number]  . Revascularization / in-situ graft leg    . Renal artery bypass Bilateral 2003  . Back surgery    . Lumbar disc surgery  1974; 2000's  . Knee arthroscopy Bilateral 1990's  . Elbow arthroscopy Right 1990's  . Cholecystectomy  12/15/?2010  . Lipoma excision Left 2013    "near ear" (05/25/2013)  . Heel spur surgery Right 1990's  . Cataract extraction w/ intraocular lens  implant, bilateral Bilateral 2000  . Carotid endarterectomy Left ~ 2007  . Doppler echocardiography  2011  . Tonsillectomy  1946  . Femoral-popliteal bypass graft Left 05/2002    [name] 05/06/2002 (05/25/2013)  . Tumor excision Left 1960's    "fatty tumor" (05/25/2013)  . Cardiac electrophysiology study and ablation  2012  . Coronary angioplasty  1992  . Cardiac catheterization      "several" (05/25/2013)  . Coronary  angioplasty with stent placement      "last one was 11/2012  (05/25/2013)  . Cardioversion N/A 10/15/2013    Procedure: CARDIOVERSION;  Surgeon: Jolaine Artist, MD;  Location: [place];  Service: Cardiovascular;  Laterality: N/A;  . Insertion of implantable left ventricular assist device N/A 10/19/2013    Procedure: INSERTION OF IMPLANTABLE LEFT VENTRICULAR ASSIST DEVICE;  Surgeon: Gaye Pollack, MD;  Location: [place];  Service: Open Heart Surgery;  Laterality: N/A;  CIRC ARREST  NITRIC OXIDE  MEDTRONIC ICD  . Intraoperative transesophageal echocardiogram N/A 10/19/2013    Procedure: INTRAOPERATIVE TRANSESOPHAGEAL ECHOCARDIOGRAM;  Surgeon: Gaye Pollack, MD;  Location: [facility] OR;  Service: Open Heart Surgery;  Laterality: N/A;  . Tee without cardioversion N/A 11/04/2013    Procedure: TRANSESOPHAGEAL ECHOCARDIOGRAM (TEE);  Surgeon: Larey Dresser, MD;  Location: [place];  Service: Cardiovascular;  Laterality: N/A;  . Cardioversion N/A 11/04/2013    Procedure: CARDIOVERSION;  Surgeon: Larey Dresser, MD;  Location: [place];  Service: Cardiovascular;  Laterality: N/A;  . Cardioversion N/A 06/04/2014    Procedure: CARDIOVERSION;  Surgeon: Jolaine Artist, MD;  Location: [facility] ENDOSCOPY;  Service: Cardiovascular;  Laterality: N/A;  . Cystoscopy with retrograde pyelogram, ureteroscopy and stent placement Left 08/09/2014    Procedure: [place], URETEROSCOPY AND STENT PLACEMENT;  Surgeon: Bernestine Amass, MD;  Location: [facility]S;  Service: Urology;  Laterality: Left;  . Cystoscopy with retrograde pyelogram, ureteroscopy and stent placement Left 09/01/2014    Procedure: CYSTOSCOPY WITH URETEROSCOPY AND STENT REMOVAL;  Surgeon: Bernestine Amass, MD;  Location: [facility];  Service: Urology;  Laterality: Left;  . Holmium laser application Left [id number]/6734    Procedure: HOLMIUM LASER APPLICATION;  Surgeon: Bernestine Amass, MD;  Location: [facility];  Service: Urology;  Laterality: Left;   . Cystoscopy with retrograde pyelogram, ureteroscopy and stent placement Left 09/20/2014    Procedure: CYSTOSCOPY WITH RETROGRADE PYELOGRAM, URETEROSCOPY AND STENT PLACEMENT, stone removal;  Surgeon: Bernestine Amass, MD;  Location: [facility];  Service: Urology;  Laterality: Left;  Stephanie Coup ablation N/A 09/12/2011    Procedure: V-TACH ABLATION;  Surgeon: Evans Lance, MD;  Location: [facility] CATH LAB;  Service: Cardiovascular;  Laterality: N/A;  . Left heart catheterization with coronary angiogram N/A 11/24/2012    Procedure: LEFT HEART CATHETERIZATION WITH CORONARY ANGIOGRAM;  Surgeon: Peter M Martinique, MD;  Location: [facility] CATH LAB;  Service: Cardiovascular;  Laterality: N/A;  . Implantable cardioverter defibrillator generator change N/A 05/25/2013    Procedure: IMPLANTABLE CARDIOVERTER DEFIBRILLATOR GENERATOR CHANGE;  Surgeon: Deboraha Sprang, MD;  Location: [facility] CATH LAB;  Service: Cardiovascular;  Laterality: N/A;  . Lead revision N/A 06/05/2013    Procedure: LEAD REVISION;  Surgeon: Thompson Grayer, MD;  Location: [facility] CATH LAB;  Service: Cardiovascular;  Laterality: N/A;  . V-tach ablation N/A 06/08/2013    Procedure: V-TACH ABLATION;  Surgeon: Evans Lance, MD;  Location: [facility] C[facility] LAB;  Service: Cardiovascular;  Laterality: N/A;  . Right heart catheterization N/A 09/17/2013    Procedure: RIGHT HEART CATH;  Surgeon: Jolaine Artist, MD;  Location: [facility] CATH LAB;  Service: Cardiovascular;  Laterality: N/A;  . Right heart catheterization N/A 10/14/2013    Procedure: RIGHT HEART CATH;  Surgeon: Jolaine Artist, MD;  Location: [facility] CATH LAB;  Service: Cardiovascular;  Laterality: N/A;  . Right heart catheterization N/A 11/16/2013    Procedure: RIGHT HEART CATH;  Surgeon: Jolaine Artist, MD;  Location: [facility] CATH LAB;  Service: Cardiovascular;  Laterality: N/A;  . Right heart catheterization N/A 07/13/2014    Procedure: RIGHT HEART CATH;  Surgeon: Jolaine Artist, MD;  Location: [facility] CATH LAB;  Service: Cardiovascular;   Laterality: N/A;    Family History: Family History  Problem Relation Age of Onset  . Coronary artery disease    . Heart attack Mother   . Coronary artery disease Mother   . Cancer Mother   . Heart disease Mother   . Hyperlipidemia Mother   . Hypertension Mother   . Coronary artery disease Father   . Stroke Father   . Cancer Father   . Heart disease Father     before age 67  . Hyperlipidemia Father   . Hypertension Father   . Heart attack Father     Social History: History   Social History  . Marital Status: Divorced    Spouse Name: N/A  . Number of Children: 2  . Years of Education: N/A   Occupational History  . retired     [employer]  . 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 Smoking status: Former Smoker -- 0.50 packs/day for 37 years    Types: Cigarettes    Quit date: 11/05/2001  . Smokeless tobacco: Never Used  . Alcohol Use: 0.0 oz/week     Comment: 05/25/2013 "mixed drink couple times/month"  . Drug Use: No  . Sexual Activity: No   Other Topics Concern  . None   Social History Narrative   Lives with sig other.    Allergies:  Allergies  Allergen Reactions  . Demerol [Meperidine] Other (See Comments)  Paralysis. Could only move eyes.  . Meperidine Hcl Other (See Comments)    "CAN'T MOVE" CATATONIC PER PT.  [name] Opium Other (See Comments)    "CAN'T MOVE" CATATONIC PER PT.    Objective:    Vital Signs:   Temp:  [98.7 F (37.1 C)] 98.7 F (37.1 C) (04/09 2320) Pulse Rate:  [69-70] 70 (04/09 2320) Resp:  [13-20] 13 (04/09 2320) BP: (78)/(0) 78/0 mmHg (04/09 2320) SpO2:  [93 %-100 %] 100 % (04/09 2320)   There were no vitals filed for this visit.  Mean arterial Pressure 78  Physical Exam: General:  Pale. No resp difficulty HEENT: normal Neck: supple. JVP 8-9 cm. Carotids 2+ bilat; no bruits. No lymphadenopathy or thryomegaly appreciated. Cor: Mechanical heart  sounds with LVAD hum present. Lungs: clear Abdomen: soft, nontender, nondistended. No hepatosplenomegaly. No bruits or masses. Good bowel sounds. Driveline: C/D/I; securement device intact and driveline incorporated Extremities: no cyanosis, clubbing, rash.  1+ edema 1/2 up lower legs bilaterally.  Neuro: alert & orientedx3, cranial nerves grossly intact. moves all 4 extremities w/o difficulty. Affect pleasant  Telemetry: ?junctional rhythm in 70s  Labs: Basic Metabolic Panel:  Recent Labs Lab 02/12/15 2215 02/12/15 2244  NA 137 138  K 4.5 4.4  CL 106 104  CO2 24  --   GLUCOSE 127* 122*  BUN 38* 38*  CREATININE 1.71* 1.60*  CALCIUM 8.4  --     Liver Function Tests:  Recent Labs Lab 02/12/15 2215  AST 25  ALT 18  ALKPHOS 70  BILITOT 0.4  PROT 5.1*  ALBUMIN 3.0*   No results for input(s): LIPASE, AMYLASE in the last 168 hours. No results for input(s): AMMONIA in the last 168 hours.  CBC:  Recent Labs Lab 02/11/15 1303 02/12/15 2215 02/12/15 2244  WBC 7.0 7.1  --   NEUTROABS  --  4.8  --   HGB 7.3* 5.5* 5.4*  HCT 22.8* 16.7* 16.0*  MCV 97.9 102.5*  --   PLT 199 169  --     Cardiac Enzymes: No results for input(s): CKTOTAL, CKMB, CKMBINDEX, TROPONINI in the last 168 hours.  BNP: BNP (last 3 results)  Recent Labs  11/25/14 1335  BNP 420.8*    ProBNP (last 3 results)  Recent Labs  09/09/14 1038 09/13/14 1024 10/14/14 0936  PROBNP 5067.0* 5152.0* 1880.0*     CBG: No results for input(s): GLUCAP in the last 168 hours.  Coagulation Studies:  Recent Labs  02/12/15 2215  LABPROT 29.3*  INR 2.75*    Other results: EKG: Regular rhythm, no Ps discernable.  Suspect junctional.   Imaging: Dg Chest Port 1 View  02/12/2015   CLINICAL DATA:  Shortness of breath.  EXAM: PORTABLE CHEST - 1 VIEW  COMPARISON:  Two-view chest x-ray 09/20/2014  FINDINGS: The heart is enlarged. Pacing and defibrillator wires are stable in position. The left  ventricular assist device is stable. There is no edema or effusion to suggest failure. No focal airspace consolidation is present. Atherosclerotic changes are present at the aortic arch.  IMPRESSION: 1. Stable cardiomegaly without failure. 2. Stable support apparatus. 3. No focal airspace disease.   Electronically Signed   By: San Morelle M.D.   On: 02/12/2015 23:05         Assessment/Plan:  Elby Blackwelder is a 74 yo male with h/o VT, PAD and severe systolic HF (EF 19-50%) due to mixed ischemic/nonischemic cardiomyopathy.  He is s/p HM II LVAD implant for destination therapy on  October 19, 2013. He presents with GI bleeding.  1. GI bleed: Suspect upper GI bleed.  Has chronically dark stool so had not noticed a difference.  FOBT+.  - Protonix 40 mg IV bid. - Transfuse 2 units PRBCs now and repeat CBC.  - Hold ASA and coumadin for now, will consult GI in am.  2. Chronic systolic CHF: LVAD present.  Indices relatively stable except PI mildly decreased from baseline likely due to blood loss.  MAP maintained at 78.  For now, will hold amlodipine and hydralazine.  3. Paroxysmal atrial fibrillation: Regular junctional rhythm today with HR in 60s.   4. ?URI: No PNA on CXR.  Suspect symptoms due to anemia, will stop Augmentin  as it may keep the INR elevated.   I reviewed the LVAD parameters from today, and compared the results to the patient's prior recorded data.  No programming changes were made.  The LVAD is functioning within specified parameters.  The patient performs LVAD self-test daily.  LVAD interrogation was negative for any significant power changes, alarms or PI events/speed drops.  LVAD equipment check completed and is in good working order.  Back-up equipment present.   LVAD education done on emergency procedures and precautions and reviewed exit site care.  Length of Stay: 0  Loralie Champagne 02/12/2015, 11:30 PM  VAD Team Pager [phone] (7am - 7am) +++VAD ISSUES  ONLY+++   Advanced Heart Failure Team Pager [phone] (M-F; [place])  Please contact [facility] for night-coverage after hours (4p -7a ) and weekends on [email or web address] for all non- LVAD Issues

## 2015-02-12 NOTE — ED Notes (Signed)
Dr. Aundra Dubin at bedside.

## 2015-02-12 NOTE — ED Notes (Addendum)
Pt presents to ED with c/o weakness and frequent fall x2 days (abrasion and bruise noted at left elbow/arm). Pt also reports black stool, taking iron. LVAD pt. Pt pale at arrival. Alerts and oriented x4 at this time, airway intact.

## 2015-02-12 NOTE — ED Provider Notes (Signed)
CSN: [id number]     Arrival date & time 02/12/15  2201 History   First MD Initiated Contact with Patient 02/12/15 2218     Chief Complaint  Patient presents with  . Weakness  . Fall     (Consider location/radiation/quality/duration/timing/severity/associated sxs/prior Treatment) HPI   This is a 74 yo male with extensive PMH, which includes ACS, CHF, ventricular arrhythmias, sp VAD placement last December, on coumadin, presenting today with lightheadedness, falls.  This has been going on for the last two to three days, mostly at home.  It has been progressively worsening, persistent.  No new meds taken.  INR has been therapeutic.  Positive for non-productive cough.  Positive for melena, which is baseline for patient, as he takes iron.  Negative for CP.  Positive for dyspnea.  Negative for head trauma. Negative for joint pain.  Negative for unilateral weakness, numbness, or tingling.    Past Medical History  Diagnosis Date  . Bradycardia   . Ventricular tachycardia     s/p AICD;   h/o ICD shock;  Amiodarone Rx. S/P ablation in 2012  . 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 HTN (hypertension)   . HLD (hyperlipidemia)   . Cytopenia   . Hypothyroidism   . Renal insufficiency   . Claudication   . Chronic systolic heart failure   . Ischemic cardiomyopathy     echo 7/12:  EF 25-30%, mild AI, mod MR, mod LAE  . TIA (transient ischemic attack) 2001  . CVD (cerebrovascular disease)     left CEA 2008  . Stroke   . Anemia   . Inappropriate therapy from implantable cardioverter-defibrillator 04/02/2013    ATP for sinus tach  SVT wavelet identified SVT but was no  passive   . Myocardial infarction 1992  . Anginal pain   . Automatic implantable cardioverter-defibrillator in situ     Medtronic Evera device serial number [id number]    . Arthritis     "a little in  my knees" (05/25/2013)  . Melanoma of ear 2013    "near left ear" (05/25/2013)  . 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 Dysrhythmia     HX A FIB  . History of nonmelanoma skin cancer     L EAR  . Kidney stone   . History of transfusion   . Sleep apnea     denies wearing mask (05/25/2013)  . CAD (coronary artery disease)     s/p MI 1982;  s/p DES to CFX 2011;  s/p NSTEMI 4/12 in setting of VTach;  cath 7/12:  LM ok, LAD mid 30-40%, Dx's with 40%, mCFX stent patent, prox to mid RCA occluded with R to R and L to R collats.  Medical Tx was continued  . CHF (congestive heart failure)     HAS VENTRICULAR PUMP   Past Surgical History  Procedure Laterality Date  . Femoral-popliteal bypass graft Right 2011  . Cardiac defibrillator placement  2003; 2005; 05/25/2013    2014: Medtronic Evera device serial number [id number]  . Revascularization / in-situ graft leg    . Renal artery bypass Bilateral 2003  . Back surgery    . Lumbar disc surgery  1974; 2000's  . Knee arthroscopy Bilateral 1990's  . Elbow arthroscopy Right 1990's  .  Cholecystectomy  12/15/?2010  . Lipoma excision Left 2013    "near ear" (05/25/2013)  . Heel spur surgery Right 1990's  . Cataract extraction w/ intraocular lens  implant, bilateral Bilateral 2000  . Carotid endarterectomy Left ~ 2007  . Doppler echocardiography  2011  . Tonsillectomy  1946  . Femoral-popliteal bypass graft Left 05/2002    Archie Endo 05/06/2002 (05/25/2013)  . Tumor excision Left 1960's    "fatty tumor" (05/25/2013)  . Cardiac electrophysiology study and ablation  2012  . Coronary angioplasty  1992  . Cardiac catheterization      "several" (05/25/2013)  . Coronary angioplasty with stent placement      "last one was 11/2012  (05/25/2013)  . Cardioversion N/A 10/15/2013    Procedure: CARDIOVERSION;  Surgeon: Jolaine Artist, MD;  Location: [place];  Service: Cardiovascular;  Laterality: N/A;  . Insertion of implantable left ventricular assist device N/A  10/19/2013    Procedure: INSERTION OF IMPLANTABLE LEFT VENTRICULAR ASSIST DEVICE;  Surgeon: Gaye Pollack, MD;  Location: [place];  Service: Open Heart Surgery;  Laterality: N/A;  CIR[facility] ARREST  NITRIC OXIDE  MEDTRONIC ICD  . Intraoperative transesophageal echocardiogram N/A 10/19/2013    Procedure: INTRAOPERATIVE TRANSESOPHAGEAL ECHOCARDIOGRAM;  Surgeon: Gaye Pollack, MD;  Location: [facility] OR;  Service: Open Heart Surgery;  Laterality: N/A;  . Tee without cardioversion N/A 11/04/2013    Procedure: TRANSESOPHAGEAL ECHOCARDIOGRAM (TEE);  Surgeon: Larey Dresser, MD;  Location: [place];  Service: Cardiovascular;  Laterality: N/A;  . Cardioversion N/A 11/04/2013    Procedure: CARDIOVERSION;  Surgeon: Larey Dresser, MD;  Location: [place];  Service: Cardiovascular;  Laterality: N/A;  . Cardioversion N/A 06/04/2014    Procedure: CARDIOVERSION;  Surgeon: Jolaine Artist, MD;  Location: [facility] ENDOSCOPY;  Service: Cardiovascular;  Laterality: N/A;  . Cystoscopy with retrograde pyelogram, ureteroscopy and stent placement Left 08/09/2014    Procedure: [place], URETEROSCOPY AND STENT PLACEMENT;  Surgeon: Bernestine Amass, MD;  Location: [facility] ORS;  Service: Urology;  Laterality: Left;  . Cystoscopy with retrograde pyelogram, ureteroscopy and stent placement Left 09/01/2014    Procedure: CYSTOSCOPY WITH URETEROSCOPY AND STENT REMOVAL;  Surgeon: Bernestine Amass, MD;  Location: [facility]S;  Service: Urology;  Laterality: Left;  . Holmium laser application Left 99/83/3825    Procedure: HOLMIUM LASER APPLICATION;  Surgeon: Bernestine Amass, MD;  Location: [facility];  Service: Urology;  Laterality: Left;  . Cystoscopy with retrograde pyelogram, ureteroscopy and stent placement Left 09/20/2014    Procedure: CYSTOSCOPY WITH RETROGRADE PYELOGRAM, URETEROSCOPY AND STENT PLACEMENT, stone removal;  Surgeon: Bernestine Amass, MD;  Location: [facility];  Service: Urology;  Laterality: Left;  Stephanie Coup  ablation N/A 09/12/2011    Procedure: V-TACH ABLATION;  Surgeon: Evans Lance, MD;  Location: [facility] CATH LAB;  Service: Cardiovascular;  Laterality: N/A;  . Left heart catheterization with coronary angiogram N/A 11/24/2012    Procedure: LEFT HEART CATHETERIZATION WITH CORONARY ANGIOGRAM;  Surgeon: Peter M Martinique, MD;  Location: [facility] C[facility] LAB;  Service: Cardiovascular;  Laterality: N/A;  . Implantable cardioverter defibrillator generator change N/A 05/25/2013    Procedure: IMPLANTABLE CARDIOVERTER DEFIBRILLATOR GENERATOR CHANGE;  Surgeon: Deboraha Sprang, MD;  Location: [facility] CATH LAB;  Service: Cardiovascular;  Laterality: N/A;  . Lead revision N/A 06/05/2013    Procedure: LEAD REVISION;  Surgeon: Thompson Grayer, MD;  Location: [facility] CATH LAB;  Service: Cardiovascular;  Laterality: N/A;  . V-tach ablation N/A 06/08/2013  Procedure: V-TACH ABLATION;  Surgeon: Evans Lance, MD;  Location: [facility] CATH LAB;  Service: Cardiovascular;  Laterality: N/A;  . Right heart catheterization N/A 09/17/2013    Procedure: RIGHT HEART CATH;  Surgeon: Jolaine Artist, MD;  Location: [facility] CATH LAB;  Service: Cardiovascular;  Laterality: N/A;  . Right heart catheterization N/A 10/14/2013    Procedure: RIGHT HEART CATH;  Surgeon: Jolaine Artist, MD;  Location: [facility] CATH LAB;  Service: Cardiovascular;  Laterality: N/A;  . Right heart catheterization N/A 11/16/2013    Procedure: RIGHT HEART CATH;  Surgeon: Jolaine Artist, MD;  Location: [facility] CATH LAB;  Service: Cardiovascular;  Laterality: N/A;  . Right heart catheterization N/A 07/13/2014    Procedure: RIGHT HEART CATH;  Surgeon: Jolaine Artist, MD;  Location: [facility] CATH LAB;  Service: Cardiovascular;  Laterality: N/A;   Family History  Problem Relation Age of Onset  . Coronary artery disease    . Heart attack Mother   . Coronary artery disease Mother   . Cancer Mother   . Heart disease Mother   . Hyperlipidemia Mother   . Hypertension Mother   . Coronary artery disease Father    . Stroke Father   . Cancer Father   . Heart disease Father     before age 17  . Hyperlipidemia Father   . Hypertension Father   . Heart attack Father    History  Substance Use Topics  . Smoking status: Former Smoker -- 0.50 packs/day for 37 years    Types: Cigarettes    Quit date: 11/05/2001  . Smokeless tobacco: Never Used  . Alcohol Use: 0.0 oz/week     Comment: 05/25/2013 "mixed drink couple times/month"    Review of Systems  Constitutional: Negative for fever and chills.  HENT: Negative for facial swelling.   Eyes: Negative for pain and visual disturbance.  Respiratory: Positive for shortness of breath. Negative for chest tightness.   Cardiovascular: Negative for chest pain.  Gastrointestinal: Positive for nausea. Negative for vomiting.  Genitourinary: Negative for dysuria.  Musculoskeletal: Negative for myalgias and arthralgias.  Skin: Positive for pallor.  Neurological: Positive for light-headedness. Negative for headaches.  Psychiatric/Behavioral: Negative for behavioral problems.      Allergies  Demerol; Meperidine hcl; and Opium  Home Medications   Prior to Admission medications   Medication Sig Start Date End Date Taking? Authorizing Provider  amiodarone (PACERONE) 200 MG tablet Take 1 tablet (200 mg total) by mouth 2 (two) times daily. 11/30/14  Yes Shaune Pascal Bensimhon, MD  amLODipine (NORVASC) 10 MG tablet Take 10 mg by mouth every evening.   Yes Historical Provider, MD  amoxicillin-clavulanate (AUGMENTIN) 875-125 MG per tablet Take 1 tablet by mouth 2 (two) times daily. 02/11/15  Yes Larey Dresser, MD  aspirin 81 MG tablet Take 81 mg by mouth every morning.    Yes Historical Provider, MD  atorvastatin (LIPITOR) 80 MG tablet Take 80 mg by mouth every morning.   Yes Historical Provider, MD  docusate sodium (COLACE) 100 MG capsule Take 200 mg by mouth every morning.   Yes Historical Provider, MD  ferrous gluconate (FERGON) 324 MG tablet Take 1 tablet (324 mg  total) by mouth 3 (three) times daily with meals. 06/07/14  Yes Jolaine Artist, MD  furosemide (LASIX) 20 MG tablet Take 20 mg by mouth daily as needed for fluid.    Yes Historical Provider, MD  hydrALAZINE (APRESOLINE) 25 MG tablet Take 1.5 tablets (37.5 mg total) by  mouth 3 (three) times daily. 10/14/14  Yes Rande Brunt, NP  levothyroxine (SYNTHROID, LEVOTHROID) 175 MCG tablet Take 1 tablet (175 mcg total) by mouth daily before breakfast. 12/31/14  Yes Deboraha Sprang, MD  magnesium gluconate (MAGONATE) 500 MG tablet Take 1,000 mg by mouth daily.   Yes Historical Provider, MD  Multiple Vitamin (MULTIVITAMIN) tablet Take 1 tablet by mouth at bedtime.    Yes Historical Provider, MD  Multiple Vitamins-Minerals (ICAPS PO) Take 1 tablet by mouth 2 (two) times daily.   Yes Historical Provider, MD  nitroGLYCERIN (NITROSTAT) 0.4 MG SL tablet Place 0.4 mg under the tongue every 5 (five) minutes as needed for chest pain.    Yes Historical Provider, MD  pantoprazole (PROTONIX) 40 MG tablet TAKE 1 TABLET (40 MG TOTAL) BY MOUTH DAILY. 09/01/14  Yes Jolaine Artist, MD  promethazine (PHENERGAN) 12.5 MG tablet Take 1 tablet (12.5 mg total) by mouth every 6 (six) hours as needed for nausea or vomiting. 01/25/15  Yes Larey Dresser, MD  RANEXA 500 MG 12 hr tablet TAKE 1 TABLET (500 MG TOTAL) BY MOUTH 2 (TWO) TIMES DAILY. 02/07/15  Yes Larey Dresser, MD  ranolazine (RANEXA) 500 MG 12 hr tablet Take 500 mg by mouth 2 (two) times daily.   Yes Historical Provider, MD  warfarin (COUMADIN) 2 MG tablet Take 2-4 mg by mouth at bedtime. Takes 2mg  daily, except takes 4mg  on Monday, Wednesday and  Friday.   Yes Historical Provider, MD  zinc gluconate 50 MG tablet Take 100 mg by mouth daily.    Yes Historical Provider, MD  enoxaparin (LOVENOX) 40 MG/0.4ML injection Inject 0.9 mLs (90 mg total) into the skin 2 (two) times daily. Inject into the skin of your abdomen twice a day. Patient not taking: Reported on 01/27/2015  12/17/14   Amy D Clegg, NP   BP 78/0 mmHg  Pulse 69  Resp 20  SpO2 93% Physical Exam  Constitutional: He is oriented to person, place, and time. He appears well-developed and well-nourished. No distress.  HENT:  Head: Normocephalic and atraumatic.  Mouth/Throat: No oropharyngeal exudate.  Eyes: Conjunctivae are normal. Pupils are equal, round, and reactive to light. No scleral icterus.  Neck: Normal range of motion. No tracheal deviation present. No thyromegaly present.  Cardiovascular: Normal rate and regular rhythm.  Exam reveals no gallop and no friction rub.   No murmur heard. Good VAD sounds  Pulmonary/Chest: Effort normal and breath sounds normal. No stridor. No respiratory distress. He has no wheezes. He has no rales. He exhibits no tenderness.  Abdominal: Soft. He exhibits no distension and no mass. There is no tenderness. There is no rebound and no guarding.  Musculoskeletal: Normal range of motion. He exhibits no edema.  Neurological: He is alert and oriented to person, place, and time.  Skin: Skin is warm and dry. He is not diaphoretic. There is pallor (profoundly).    ED Course  Procedures (including critical care time) Labs Review Labs Reviewed  CBC WITH DIFFERENTIAL/PLATELET - Abnormal; Notable for the following:    RBC 1.63 (*)    Hemoglobin 5.5 (*)    HCT 16.7 (*)    MCV 102.5 (*)    RDW 19.6 (*)    Monocytes Relative 14 (*)    All other components within normal limits  COMPREHENSIVE METABOLIC PANEL - Abnormal; Notable for the following:    Glucose, Bld 127 (*)    BUN 38 (*)    Creatinine, Ser 1.71 (*)  Total Protein 5.1 (*)    Albumin 3.0 (*)    GFR calc non Af [name] 37 (*)    GFR calc Af [name] 43 (*)    All other components within normal limits  PROTIME-INR - Abnormal; Notable for the following:    Prothrombin Time 29.3 (*)    INR 2.75 (*)    All other components within normal limits  I-STAT CHEM 8, ED - Abnormal; Notable for the following:    BUN 38  (*)    Creatinine, Ser 1.60 (*)    Glucose, Bld 122 (*)    Hemoglobin 5.4 (*)    HCT 16.0 (*)    All other components within normal limits  POC OCCULT BLOOD, ED - Abnormal; Notable for the following:    Fecal Occult Bld POSITIVE (*)    All other components within normal limits  LACTATE DEHYDROGENASE  I-STAT TROPOININ, ED  TYPE AND SCREEN  PREPARE RBC (CROSSMATCH)  PREPARE RBC (CROSSMATCH)    Imaging Review Dg Chest Port 1 View  02/12/2015   CLINICAL DATA:  Shortness of breath.  EXAM: PORTABLE CHEST - 1 VIEW  COMPARISON:  Two-view chest x-ray 09/20/2014  FINDINGS: The heart is enlarged. Pacing and defibrillator wires are stable in position. The left ventricular assist device is stable. There is no edema or effusion to suggest failure. No focal airspace consolidation is present. Atherosclerotic changes are present at the aortic arch.  IMPRESSION: 1. Stable cardiomegaly without failure. 2. Stable support apparatus. 3. No focal airspace disease.   Electronically Signed   By: San Morelle M.D.   On: 02/12/2015 23:05     EKG Interpretation None      MDM   Final diagnoses:  Shortness of breath    This is a 74 yo male with extensive PMH, which includes ACS, CHF, ventricular arrhythmias, sp VAD placement last December, on coumadin, presenting today with lightheadedness, falls.  This has been going on for the last two to three days, mostly at home.  It has been progressively worsening, persistent.  No new meds taken.  INR has been therapeutic.  Positive for non-productive cough.  Positive for melena, which is baseline for patient, as he takes iron.  Negative for CP.  Positive for dyspnea.  Negative for head trauma. Negative for joint pain.  Negative for unilateral weakness, numbness, or tingling.    On exam, the patient is pale.  Positive for substantial conjunctival pallor, although cap refill is about 2 second.  On auscultation, LVAD sounds are WNL.  MAP is 78.  HR is WNL.  Pt is  satting 87% on RA, has been placed on O2. No abnormalities on auscultation of lungs.  Perianal area has dark black stool, hemoccult positive.  I am concerned for anemia due to GI bleed at this time, which would be masked by baseline dark stool.  On chart review, Hg from yesterday was ~4 g less than previous draw.  Will redraw blood count, chem, coags, trop.  Will order CXR, EKG.  Will fu, cont to monitor closely.  I have ordered type and screen.  Hg is 5.8.  Will infuse one unit.  Awaiting cards recs.  Cards has seen the pt.  Pt is currently being admitted in stable condition.   I have discussed case and care has been guided by my attending physician, Dr. Darl Householder.  Doy Hutching, MD 02/13/15 0031  Wandra Arthurs, MD 02/14/15 [phone]

## 2015-02-13 LAB — BASIC METABOLIC PANEL
Anion gap: 5 (ref 5–15)
BUN: 33 mg/dL — ABNORMAL HIGH (ref 6–23)
CHLORIDE: 106 mmol/L (ref 96–112)
CO2: 28 mmol/L (ref 19–32)
Calcium: 7.8 mg/dL — ABNORMAL LOW (ref 8.4–10.5)
Creatinine, Ser: 1.5 mg/dL — ABNORMAL HIGH (ref 0.50–1.35)
GFR calc non Af Amer: 44 mL/min — ABNORMAL LOW (ref >90)
GFR, EST AFRICAN AMERICAN: 51 mL/min — AB (ref >90)
GLUCOSE: 107 mg/dL — AB (ref 70–99)
Potassium: 4 mmol/L (ref 3.5–5.1)
SODIUM: 139 mmol/L (ref 135–145)

## 2015-02-13 LAB — CBC
HCT: 21.2 % — ABNORMAL LOW (ref 39.0–52.0)
HCT: 26.4 % — ABNORMAL LOW (ref 39.0–52.0)
Hemoglobin: 6.9 g/dL — CL (ref 13.0–17.0)
Hemoglobin: 8.8 g/dL — ABNORMAL LOW (ref 13.0–17.0)
MCH: 30.4 pg (ref 26.0–34.0)
MCH: 31 pg (ref 26.0–34.0)
MCHC: 32.5 g/dL (ref 30.0–36.0)
MCHC: 33.3 g/dL (ref 30.0–36.0)
MCV: 93.4 fL (ref 78.0–100.0)
MCV: 93 fL (ref 78.0–100.0)
PLATELETS: 138 10*3/uL — AB (ref 150–400)
Platelets: 144 10*3/uL — ABNORMAL LOW (ref 150–400)
RBC: 2.27 MIL/uL — ABNORMAL LOW (ref 4.22–5.81)
RBC: 2.84 MIL/uL — ABNORMAL LOW (ref 4.22–5.81)
RDW: 20.6 % — ABNORMAL HIGH (ref 11.5–15.5)
RDW: 20.5 % — ABNORMAL HIGH (ref 11.5–15.5)
WBC: 6.4 10*3/uL (ref 4.0–10.5)
WBC: 8 10*3/uL (ref 4.0–10.5)

## 2015-02-13 LAB — PROTIME-INR
INR: 2.61 — ABNORMAL HIGH (ref 0.00–1.49)
Prothrombin Time: 28.1 seconds — ABNORMAL HIGH (ref 11.6–15.2)

## 2015-02-13 LAB — LACTATE DEHYDROGENASE: LDH: 194 U/L (ref 94–250)

## 2015-02-13 LAB — PREPARE RBC (CROSSMATCH)

## 2015-02-13 MED ORDER — PANTOPRAZOLE SODIUM 40 MG IV SOLR
40.0000 mg | Freq: Two times a day (BID) | INTRAVENOUS | Status: DC
  Administered 2015-02-13 – 2015-02-17 (×8): 40 mg via INTRAVENOUS
  Filled 2015-02-13 (×12): qty 40

## 2015-02-13 MED ORDER — NITROGLYCERIN 0.4 MG SL SUBL: 0.4000 mg | SUBLINGUAL_TABLET | SUBLINGUAL | Status: DC | PRN

## 2015-02-13 MED ORDER — SODIUM CHLORIDE 0.9 % IJ SOLN
3.0000 mL | Freq: Two times a day (BID) | INTRAMUSCULAR | Status: DC
  Administered 2015-02-13 – 2015-02-16 (×8): 3 mL via INTRAVENOUS
  Administered 2015-02-17: 10 mL via INTRAVENOUS

## 2015-02-13 MED ORDER — ATORVASTATIN CALCIUM 80 MG PO TABS
80.0000 mg | ORAL_TABLET | Freq: Every day | ORAL | Status: DC
  Administered 2015-02-13 – 2015-02-17 (×5): 80 mg via ORAL
  Filled 2015-02-13 (×5): qty 1

## 2015-02-13 MED ORDER — LEVOTHYROXINE SODIUM 175 MCG PO TABS
175.0000 ug | ORAL_TABLET | Freq: Every day | ORAL | Status: DC
  Administered 2015-02-13 – 2015-02-17 (×5): 175 ug via ORAL
  Filled 2015-02-13 (×7): qty 1

## 2015-02-13 MED ORDER — FERROUS GLUCONATE 324 (38 FE) MG PO TABS
324.0000 mg | ORAL_TABLET | Freq: Three times a day (TID) | ORAL | Status: DC
  Administered 2015-02-13 – 2015-02-16 (×7): 324 mg via ORAL
  Filled 2015-02-13 (×13): qty 1

## 2015-02-13 MED ORDER — DOCUSATE SODIUM 100 MG PO CAPS
200.0000 mg | ORAL_CAPSULE | Freq: Every day | ORAL | Status: DC
  Administered 2015-02-13: 200 mg via ORAL
  Filled 2015-02-13 (×5): qty 2

## 2015-02-13 MED ORDER — PROMETHAZINE HCL 25 MG PO TABS
12.5000 mg | ORAL_TABLET | Freq: Four times a day (QID) | ORAL | Status: DC | PRN
Start: 2015-02-13 — End: 2015-02-17

## 2015-02-13 MED ORDER — AMIODARONE HCL 200 MG PO TABS
200.0000 mg | ORAL_TABLET | Freq: Two times a day (BID) | ORAL | Status: DC
  Administered 2015-02-13 – 2015-02-17 (×9): 200 mg via ORAL
  Filled 2015-02-13 (×11): qty 1

## 2015-02-13 MED ORDER — SODIUM CHLORIDE 0.9 % IJ SOLN: 3.0000 mL | INTRAMUSCULAR | Status: DC | PRN

## 2015-02-13 MED ORDER — RANOLAZINE ER 500 MG PO TB12
500.0000 mg | ORAL_TABLET | Freq: Two times a day (BID) | ORAL | Status: DC
  Administered 2015-02-13 – 2015-02-17 (×9): 500 mg via ORAL
  Filled 2015-02-13 (×11): qty 1

## 2015-02-13 MED ORDER — SODIUM CHLORIDE 0.9 % IV SOLN: 250.0000 mL | INTRAVENOUS | Status: DC | PRN

## 2015-02-13 MED ORDER — SODIUM CHLORIDE 0.9 % IV SOLN
Freq: Once | INTRAVENOUS | Status: AC
  Administered 2015-02-13: 08:00:00 via INTRAVENOUS

## 2015-02-13 MED ORDER — MAGNESIUM GLUCONATE 500 MG PO TABS
1000.0000 mg | ORAL_TABLET | Freq: Every day | ORAL | Status: DC
  Administered 2015-02-13 – 2015-02-17 (×5): 1000 mg via ORAL
  Filled 2015-02-13 (×5): qty 2

## 2015-02-13 NOTE — Progress Notes (Signed)
Pt ambulated in hall with rolling walker and VAD on battery power 60 feet at slow pace. He had mild shortness of breath by the end, but otherwise tolerated well. Girl friend at bedside. 2 units PRBC's transfused today and CBC scheduled for 2 hr post transfusion at 1815.

## 2015-02-13 NOTE — Progress Notes (Signed)
Utilization review completed.  

## 2015-02-13 NOTE — Progress Notes (Signed)
Patient ID: [name], male   DOB: 21-Oct-1941, 74 y.o.   MRN: [id number] HeartMate 2 Rounding Note  Subjective:    Admitted with suspected upper GI bleeding (dark stool but no change from prior given Fe use).  Profoundly weak and hemoglobin 5.5 at admission, up to 6.9 this morning after transfusion of 2 units PRBCs.  He feels much better today.   LVAD INTERROGATION:  HeartMate II LVAD:  Flow 4.8 liters/min, speed 9200, power 5.5, PI 6.5.  1 PI event since admission  Objective:    Vital Signs:   Temp:  [98.5 F (36.9 C)-98.9 F (37.2 C)] 98.7 F (37.1 C) (04/10 0430) Pulse Rate:  [62-76] 69 (04/10 0430) Resp:  [12-20] 16 (04/10 0430) BP: (78-92)/(0) 90/0 mmHg (04/10 0245) SpO2:  [92 %-100 %] 92 % (04/10 0430) Weight:  [198 lb 3.1 oz (89.9 kg)] 198 lb 3.1 oz (89.9 kg) (04/10 0210) Last BM Date: 02/12/15 Mean arterial Pressure 90  Intake/Output:   Intake/Output Summary (Last 24 hours) at 02/13/15 0736 Last data filed at 02/13/15 0530  Gross per 24 hour  Intake    700 ml  Output    500 ml  Net    200 ml     Physical Exam: General:  Well appearing. No resp difficulty HEENT: normal Neck: supple. JVP 8-9. Carotids 2+ bilat; no bruits. No lymphadenopathy or thryomegaly appreciated. Cor: Mechanical heart sounds with LVAD hum present. Lungs: clear Abdomen: soft, nontender, nondistended. No hepatosplenomegaly. No bruits or masses. Good bowel sounds. Driveline: C/D/I; securement device intact and driveline incorporated Extremities: no cyanosis, clubbing, rash, edema Neuro: alert & orientedx3, cranial nerves grossly intact. moves all 4 extremities w/o difficulty. Affect pleasant  Telemetry: a-paced, v-sensed  Labs: Basic Metabolic Panel:  Recent Labs Lab 02/12/15 2215 02/12/15 2244 02/13/15 0545  NA 137 138 139  K 4.5 4.4 4.0  CL 106 104 106  CO2 24  --  28  GLUCOSE 127* 122* 107*  BUN 38* 38* 33*  CREATININE 1.71* 1.60* 1.50*  CALCIUM 8.4  --  7.8*    Liver  Function Tests:  Recent Labs Lab 02/12/15 2215  AST 25  ALT 18  ALKPHOS 70  BILITOT 0.4  PROT 5.1*  ALBUMIN 3.0*   No results for input(s): LIPASE, AMYLASE in the last 168 hours. No results for input(s): AMMONIA in the last 168 hours.  CBC:  Recent Labs Lab 02/11/15 1303 02/12/15 2215 02/12/15 2244 02/13/15 0545  WBC 7.0 7.1  --  6.4  NEUTROABS  --  4.8  --   --   HGB 7.3* 5.5* 5.4* 6.9*  HCT 22.8* 16.7* 16.0* 21.2*  MCV 97.9 102.5*  --  93.4  PLT 199 169  --  138*    INR:  Recent Labs Lab 02/08/15 0241 02/12/15 2215 02/13/15 0545  INR 2.5* 2.75* 2.61*    Other results:  EKG:   Imaging: Dg Chest Port 1 View  02/12/2015   CLINICAL DATA:  Shortness of breath.  EXAM: PORTABLE CHEST - 1 VIEW  COMPARISON:  Two-view chest x-ray 09/20/2014  FINDINGS: The heart is enlarged. Pacing and defibrillator wires are stable in position. The left ventricular assist device is stable. There is no edema or effusion to suggest failure. No focal airspace consolidation is present. Atherosclerotic changes are present at the aortic arch.  IMPRESSION: 1. Stable cardiomegaly without failure. 2. Stable support apparatus. 3. No focal airspace disease.   Electronically Signed   By: Wynetta Fines.D.  On: 02/12/2015 23:05      Medications:     Scheduled Medications: . sodium chloride  10 mL/hr Intravenous Once  . sodium chloride   Intravenous Once  . sodium chloride   Intravenous Once  . amiodarone  200 mg Oral BID  . atorvastatin  80 mg Oral Daily  . docusate sodium  200 mg Oral Daily  . ferrous gluconate  324 mg Oral TID WC  . levothyroxine  175 mcg Oral QAC breakfast  . magnesium gluconate  1,000 mg Oral Daily  . pantoprazole (PROTONIX) IV  40 mg Intravenous Q12H  . ranolazine  500 mg Oral BID  . sodium chloride  3 mL Intravenous Q12H     Infusions:     PRN Medications:  sodium chloride, nitroGLYCERIN, promethazine, sodium chloride   Assessment:  1. 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 CAD 3. Paroxysmal atrial fibrillation 4. Nephrolithiasis 5. GI bleeding: FOBT +.   Plan/Discussion:   Transfused 2 units last night, feels better, hemoglobin up to 6.9.  PI higher.  No stool overnight.  Has been dark but always dark x months on iron.  No evidence for hemolysis (LDH, tbili normal).   - Transfuse 2 more units today, repeat CBC this afternoon.  - GI consult - Will hold coumadin and ASA, let INR taper down without vitamin K given stability.  - Continue IV Protonix.   I reviewed the LVAD parameters from today, and compared the results to the patient's prior recorded data.  No programming changes were made.  The LVAD is functioning within specified parameters.  The patient performs LVAD self-test daily.  LVAD interrogation was negative for any significant power changes, alarms or PI events/speed drops.  LVAD equipment check completed and is in good working order.  Back-up equipment present.   LVAD education done on emergency procedures and precautions and reviewed exit site care.  Length of Stay: [place] 02/13/2015, 7:36 AM  VAD Team --- VAD ISSUES ONLY--- Pager [phone] (7am - 7am)  Advanced Heart Failure Team  Pager [phone] (M-F; 7a - 4p)  Please contact [facility] for night-coverage after hours (4p -7a ) and weekends on [email or web address]

## 2015-02-13 NOTE — Consult Note (Signed)
Unassigned Consult  Reason for Consult: Anemia and Melena Referring Physician: Cardiology  [name] HPI: This is a 74 year old male with a continuous flow LVAD for destination therapy 10/19/2013, CHF with an EF of 20-25%, VT, and PAD admitted for weakness.  The patient was felt to have an URI and he also fell.  Augmentin was initiated, but he continued to have weakness and falls.  His DOE worsened and in the ER his HGB was noted to be at 5.4 g/dL.  Previously on 01/27/2015 his HGB was in the4 11 range.  The stool was noted to be heme positive, but no worsening of his black stools.  He has formed black stools as a result of taking iron.  He was transfused with 2 units of PRBC and he feels better.  Five years ago he had a normal colonoscopy in [place].  No prior history of GI bleed.  He takes coumadin and this has been held by Dr. Aundra Dubin.  Currently his INR is at 2.6.  Past Medical History  Diagnosis Date  . Bradycardia   . Ventricular tachycardia     s/p AICD;   h/o ICD shock;  Amiodarone Rx. S/P ablation in 2012  . 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 HTN (hypertension)   . HLD (hyperlipidemia)   . Cytopenia   . Hypothyroidism   . Renal insufficiency   . Claudication   . Chronic systolic heart failure   . Ischemic cardiomyopathy     echo 7/12:  EF 25-30%, mild AI, mod MR, mod LAE  . TIA (transient ischemic attack) 2001  . CVD (cerebrovascular disease)     left CEA 2008  . Stroke   . Anemia   . Inappropriate therapy from implantable cardioverter-defibrillator 04/02/2013    ATP for sinus tach  SVT wavelet identified SVT but was no  passive   . Myocardial infarction 1992  . Anginal pain   . Automatic implantable cardioverter-defibrillator in situ     Medtronic Evera device serial number [id number]    . Arthritis     "a little in my knees"  (05/25/2013)  . Melanoma of ear 2013    "near left ear" (05/25/2013)  . 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 Dysrhythmia     HX A FIB  . History of nonmelanoma skin cancer     L EAR  . Kidney stone   . History of transfusion   . Sleep apnea     denies wearing mask (05/25/2013)  . CAD (coronary artery disease)     s/p MI 1982;  s/p DES to CFX 2011;  s/p NSTEMI 4/12 in setting of VTach;  cath 7/12:  LM ok, LAD mid 30-40%, Dx's with 40%, mCFX stent patent, prox to mid RCA occluded with R to R and L to R collats.  Medical Tx was continued  . CHF (congestive heart failure)     HAS VENTRICULAR PUMP    Past Surgical History  Procedure Laterality Date  . Femoral-popliteal bypass graft Right 2011  . Cardiac defibrillator placement  2003; 2005; 05/25/2013    2014: Medtronic Evera device serial number [id number]  . Revascularization / in-situ graft leg    . Renal artery bypass Bilateral 2003  . Back surgery    .  Lumbar disc surgery  1974; 2000's  . Knee arthroscopy Bilateral 1990's  . Elbow arthroscopy Right 1990's  . Cholecystectomy  12/15/?2010  . Lipoma excision Left 2013    "near ear" (05/25/2013)  . Heel spur surgery Right 1990's  . Cataract extraction w/ intraocular lens  implant, bilateral Bilateral 2000  . Carotid endarterectomy Left ~ 2007  . Doppler echocardiography  2011  . Tonsillectomy  1946  . Femoral-popliteal bypass graft Left 05/2002    Archie Endo 05/06/2002 (05/25/2013)  . Tumor excision Left 1960's    "fatty tumor" (05/25/2013)  . Cardiac electrophysiology study and ablation  2012  . Coronary angioplasty  1992  . Cardiac catheterization      "several" (05/25/2013)  . Coronary angioplasty with stent placement      "last one was 11/2012  (05/25/2013)  . Cardioversion N/A 10/15/2013    Procedure: CARDIOVERSION;  Surgeon: Jolaine Artist, MD;  Location: [place];  Service: Cardiovascular;  Laterality: N/A;  . Insertion of implantable left ventricular assist device N/A  10/19/2013    Procedure: INSERTION OF IMPLANTABLE LEFT VENTRICULAR ASSIST DEVICE;  Surgeon: Gaye Pollack, MD;  Location: [place];  Service: Open Heart Surgery;  Laterality: N/A;  CIRC ARREST  NITRIC OXIDE  MEDTRONIC ICD  . Intraoperative transesophageal echocardiogram N/A 10/19/2013    Procedure: INTRAOPERATIVE TRANSESOPHAGEAL ECHOCARDIOGRAM;  Surgeon: Gaye Pollack, MD;  Location: [facility] OR;  Service: Open Heart Surgery;  Laterality: N/A;  . Tee without cardioversion N/A 11/04/2013    Procedure: TRANSESOPHAGEAL ECHOCARDIOGRAM (TEE);  Surgeon: Larey Dresser, MD;  Location: [place];  Service: Cardiovascular;  Laterality: N/A;  . Cardioversion N/A 11/04/2013    Procedure: CARDIOVERSION;  Surgeon: Larey Dresser, MD;  Location: [place];  Service: Cardiovascular;  Laterality: N/A;  . Cardioversion N/A 06/04/2014    Procedure: CARDIOVERSION;  Surgeon: Jolaine Artist, MD;  Location: [facility] ENDOSCOPY;  Service: Cardiovascular;  Laterality: N/A;  . Cystoscopy with retrograde pyelogram, ureteroscopy and stent placement Left 08/09/2014    Procedure: [place], URETEROSCOPY AND STENT PLACEMENT;  Surgeon: Bernestine Amass, MD;  Location: [facility] ORS;  Service: Urology;  Laterality: Left;  . Cystoscopy with retrograde pyelogram, ureteroscopy and stent placement Left 09/01/2014    Procedure: CYSTOSCOPY WITH URETEROSCOPY AND STENT REMOVAL;  Surgeon: Bernestine Amass, MD;  Location: [facility];  Service: Urology;  Laterality: Left;  . Holmium laser application Left 85/27/7824    Procedure: HOLMIUM LASER APPLICATION;  Surgeon: Bernestine Amass, MD;  Location: [facility];  Service: Urology;  Laterality: Left;  . Cystoscopy with retrograde pyelogram, ureteroscopy and stent placement Left 09/20/2014    Procedure: CYSTOSCOPY WITH RETROGRADE PYELOGRAM, URETEROSCOPY AND STENT PLACEMENT, stone removal;  Surgeon: Bernestine Amass, MD;  Location: [facility];  Service: Urology;  Laterality: Left;  Stephanie Coup  ablation N/A 09/12/2011    Procedure: V-TACH ABLATION;  Surgeon: Evans Lance, MD;  Location: [facility] CATH LAB;  Service: Cardiovascular;  Laterality: N/A;  . Left heart catheterization with coronary angiogram N/A 11/24/2012    Procedure: LEFT HEART CATHETERIZATION WITH CORONARY ANGIOGRAM;  Surgeon: Peter M Martinique, MD;  Location: [facility] CATH LAB;  Service: Cardiovascular;  Laterality: N/A;  . Implantable cardioverter defibrillator generator change N/A 05/25/2013    Procedure: IMPLANTABLE CARDIOVERTER DEFIBRILLATOR GENERATOR CHANGE;  Surgeon: Deboraha Sprang, MD;  Location: [facility] CATH LAB;  Service: Cardiovascular;  Laterality: N/A;  . Lead revision N/A 06/05/2013    Procedure: LEAD REVISION;  Surgeon: Thompson Grayer, MD;  Location: [place] CATH LAB;  Service: Cardiovascular;  Laterality: N/A;  . V-tach ablation N/A 06/08/2013    Procedure: V-TACH ABLATION;  Surgeon: Evans Lance, MD;  Location: [facility] C[facility] LAB;  Service: Cardiovascular;  Laterality: N/A;  . Right heart catheterization N/A 09/17/2013    Procedure: RIGHT HEART CATH;  Surgeon: Jolaine Artist, MD;  Location: [facility] CATH LAB;  Service: Cardiovascular;  Laterality: N/A;  . Right heart catheterization N/A 10/14/2013    Procedure: RIGHT HEART CATH;  Surgeon: Jolaine Artist, MD;  Location: [facility] CATH LAB;  Service: Cardiovascular;  Laterality: N/A;  . Right heart catheterization N/A 11/16/2013    Procedure: RIGHT HEART CATH;  Surgeon: Jolaine Artist, MD;  Location: [facility] CATH LAB;  Service: Cardiovascular;  Laterality: N/A;  . Right heart catheterization N/A 07/13/2014    Procedure: RIGHT HEART CATH;  Surgeon: Jolaine Artist, MD;  Location: [facility] CATH LAB;  Service: Cardiovascular;  Laterality: N/A;    Family History  Problem Relation Age of Onset  . Coronary artery disease    . Heart attack Mother   . Coronary artery disease Mother   . Cancer Mother   . Heart disease Mother   . Hyperlipidemia Mother   . Hypertension Mother   . Coronary artery disease  Father   . Stroke Father   . Cancer Father   . Heart disease Father     before age 70  . Hyperlipidemia Father   . Hypertension Father   . Heart attack Father     Social History:  reports that he quit smoking about 13 years ago. His smoking use included Cigarettes. He has a 18.5 pack-year smoking history. He has never used smokeless tobacco. He reports that he drinks alcohol. He reports that he does not use illicit drugs.  Allergies:  Allergies  Allergen Reactions  . Demerol [Meperidine] Other (See Comments)    Paralysis. Could only move eyes.  . Meperidine Hcl Other (See Comments)    "CAN'T MOVE" CATATONIC PER PT.  [name] Opium Other (See Comments)    "CAN'T MOVE" CATATONIC PER PT.    Medications:  Scheduled: . sodium chloride  10 mL/hr Intravenous Once  . sodium chloride   Intravenous Once  . amiodarone  200 mg Oral BID  . atorvastatin  80 mg Oral Daily  . docusate sodium  200 mg Oral Daily  . ferrous gluconate  324 mg Oral TID WC  . levothyroxine  175 mcg Oral QAC breakfast  . magnesium gluconate  1,000 mg Oral Daily  . pantoprazole (PROTONIX) IV  40 mg Intravenous Q12H  . ranolazine  500 mg Oral BID  . sodium chloride  3 mL Intravenous Q12H   Continuous:   Results for orders placed or performed during the hospital encounter of 02/12/15 (from the past 24 hour(s))  CBC with Differential     Status: Abnormal   Collection Time: 02/12/15 10:15 PM  Result Value Ref Range   WBC 7.1 4.0 - 10.5 K/uL   RBC 1.63 (L) 4.22 - 5.81 MIL/uL   Hemoglobin 5.5 (LL) 13.0 - 17.0 g/dL   HCT 16.7 (L) 39.0 - 52.0 %   MCV 102.5 (H) 78.0 - 100.0 fL   MCH 31.9 26.0 - 34.0 pg   MCHC 31.1 30.0 - 36.0 g/dL   RDW 19.6 (H) 11.5 - 15.5 %   Platelets 169 150 - 400 K/uL   Neutrophils Relative % 67 43 - 77 %   Neutro Abs 4.8 1.7 -  7.7 K/uL   Lymphocytes Relative 17 12 - 46 %   Lymphs Abs 1.2 0.7 - 4.0 K/uL   Monocytes Relative 14 (H) 3 - 12 %   Monocytes Absolute 1.0 0.1 - 1.0 K/uL   Eosinophils  Relative 2 0 - 5 %   Eosinophils Absolute 0.1 0.0 - 0.7 K/uL   Basophils Relative 1 0 - 1 %   Basophils Absolute 0.1 0.0 - 0.1 K/uL  Comprehensive metabolic panel     Status: Abnormal   Collection Time: 02/12/15 10:15 PM  Result Value Ref Range   Sodium 137 135 - 145 mmol/L   Potassium 4.5 3.5 - 5.1 mmol/L   Chloride 106 96 - 112 mmol/L   CO2 24 19 - 32 mmol/L   Glucose, Bld 127 (H) 70 - 99 mg/dL   BUN 38 (H) 6 - 23 mg/dL   Creatinine, Ser 1.71 (H) 0.50 - 1.35 mg/dL   Calcium 8.4 8.4 - 10.5 mg/dL   Total Protein 5.1 (L) 6.0 - 8.3 g/dL   Albumin 3.0 (L) 3.5 - 5.2 g/dL   AST 25 0 - 37 U/L   ALT 18 0 - 53 U/L   Alkaline Phosphatase 70 39 - 117 U/L   Total Bilirubin 0.4 0.3 - 1.2 mg/dL   GFR calc non Af [name] 37 (L) >90 mL/min   GFR calc Af [name] 43 (L) >90 mL/min   Anion gap 7 5 - 15  Protime-INR     Status: Abnormal   Collection Time: 02/12/15 10:15 PM  Result Value Ref Range   Prothrombin Time 29.3 (H) 11.6 - 15.2 seconds   INR 2.75 (H) 0.00 - 1.49  Type and screen     Status: None (Preliminary result)   Collection Time: 02/12/15 10:15 PM  Result Value Ref Range   ABO/RH(D) O POS    Antibody Screen NEG    Sample Expiration 02/15/2015    Unit Number [id number]    Blood Component Type RED CELLS,LR    Unit division 00    Status of Unit ISSUED    Transfusion Status OK TO TRANSFUSE    Crossmatch Result Compatible    Unit Number [id number]    Blood Component Type RED CELLS,LR    Unit division 00    Status of Unit ISSUED    Transfusion Status OK TO TRANSFUSE    Crossmatch Result Compatible    Unit Number [id number]    Blood Component Type RED CELLS,LR    Unit division 00    Status of Unit ISSUED    Transfusion Status OK TO TRANSFUSE    Crossmatch Result Compatible    Unit Number [id number]    Blood Component Type RED CELLS,LR    Unit division 00    Status of Unit ALLOCATED    Transfusion Status OK TO TRANSFUSE    Crossmatch Result Compatible   POC occult  blood, ED     Status: Abnormal   Collection Time: 02/12/15 10:21 PM  Result Value Ref Range   Fecal Occult Bld POSITIVE (A) NEGATIVE  Lactate dehydrogenase     Status: None   Collection Time: 02/12/15 10:32 PM  Result Value Ref Range   LDH 216 94 - 250 U/L  I-stat troponin, ED     Status: None   Collection Time: 02/12/15 10:42 PM  Result Value Ref Range   Troponin i, poc 0.04 0.00 - 0.08 ng/mL   Comment 3          I-stat  chem 8, ed     Status: Abnormal   Collection Time: 02/12/15 10:44 PM  Result Value Ref Range   Sodium 138 135 - 145 mmol/L   Potassium 4.4 3.5 - 5.1 mmol/L   Chloride 104 96 - 112 mmol/L   BUN 38 (H) 6 - 23 mg/dL   Creatinine, Ser 1.60 (H) 0.50 - 1.35 mg/dL   Glucose, Bld 122 (H) 70 - 99 mg/dL   Calcium, Ion 1.17 1.13 - 1.30 mmol/L   TCO2 21 0 - 100 mmol/L   Hemoglobin 5.4 (LL) 13.0 - 17.0 g/dL   HCT 16.0 (L) 39.0 - 52.0 %   Comment NOTIFIED PHYSICIAN   Prepare RBC     Status: None   Collection Time: 02/12/15 10:45 PM  Result Value Ref Range   Order Confirmation ORDER PROCESSED BY BLOOD BANK   Prepare RBC     Status: None   Collection Time: 02/12/15 11:04 PM  Result Value Ref Range   Order Confirmation ORDER PROCESSED BY BLOOD BANK   Lactate dehydrogenase     Status: None   Collection Time: 02/13/15  5:45 AM  Result Value Ref Range   LDH 194 94 - 250 U/L  Protime-INR     Status: Abnormal   Collection Time: 02/13/15  5:45 AM  Result Value Ref Range   Prothrombin Time 28.1 (H) 11.6 - 15.2 seconds   INR 2.61 (H) 0.00 - 1.49  CBC     Status: Abnormal   Collection Time: 02/13/15  5:45 AM  Result Value Ref Range   WBC 6.4 4.0 - 10.5 K/uL   RBC 2.27 (L) 4.22 - 5.81 MIL/uL   Hemoglobin 6.9 (LL) 13.0 - 17.0 g/dL   HCT 21.2 (L) 39.0 - 52.0 %   MCV 93.4 78.0 - 100.0 fL   MCH 30.4 26.0 - 34.0 pg   MCHC 32.5 30.0 - 36.0 g/dL   RDW 20.6 (H) 11.5 - 15.5 %   Platelets 138 (L) 150 - 400 K/uL  Basic metabolic panel     Status: Abnormal   Collection Time:  02/13/15  5:45 AM  Result Value Ref Range   Sodium 139 135 - 145 mmol/L   Potassium 4.0 3.5 - 5.1 mmol/L   Chloride 106 96 - 112 mmol/L   CO2 28 19 - 32 mmol/L   Glucose, Bld 107 (H) 70 - 99 mg/dL   BUN 33 (H) 6 - 23 mg/dL   Creatinine, Ser 1.50 (H) 0.50 - 1.35 mg/dL   Calcium 7.8 (L) 8.4 - 10.5 mg/dL   GFR calc non Af [name] 44 (L) >90 mL/min   GFR calc Af [name] 51 (L) >90 mL/min   Anion gap 5 5 - 15  Prepare RBC     Status: None   Collection Time: 02/13/15  7:35 AM  Result Value Ref Range   Order Confirmation ORDER PROCESSED BY BLOOD BANK      Dg Chest Port 1 View  02/12/2015   CLINICAL DATA:  Shortness of breath.  EXAM: PORTABLE CHEST - 1 VIEW  COMPARISON:  Two-view chest x-ray 09/20/2014  FINDINGS: The heart is enlarged. Pacing and defibrillator wires are stable in position. The left ventricular assist device is stable. There is no edema or effusion to suggest failure. No focal airspace consolidation is present. Atherosclerotic changes are present at the aortic arch.  IMPRESSION: 1. Stable cardiomegaly without failure. 2. Stable support apparatus. 3. No focal airspace disease.   Electronically Signed   By: Harrell Gave  Mattern M.D.   On: 02/12/2015 23:05    ROS:  As stated above in the HPI otherwise negative.  Blood pressure 90/0, pulse 70, temperature 98.1 F (36.7 C), temperature source Oral, resp. rate 16, height 6' (1.829 m), weight 89.9 kg (198 lb 3.1 oz), SpO2 100 %.    PE: Gen: NAD, Alert and Oriented HEENT:  [place]/AT, EOMI Neck: Supple, no LAD Lungs: CTA Bilaterally CV: Continuous flow of the LVAD ABM: Soft, NTND, +BS Ext: No C/C/E  Assessment/Plan: 1) Anemia 2) Heme positive stool. 3) CF-LVAD   The patient is stable.  There is a significant drop in his HGB over the past couple of weeks, but he tends to have a baseline in the 9-10 range.  Last year he did have a drop in his HGB down to the 7 range.  CF-LVADs are associated with bleeding from AVMs.  An EGD will be  pursued.  If this is negative, a capsule endoscopy can be performed for further evaluation.    Plan: 1) EGD tomorrow. 2) Follow HGB and transfuse as necessary.  [name] D 02/13/2015, 9:44 AM

## 2015-02-14 DIAGNOSIS — K922 Gastrointestinal hemorrhage, unspecified: Secondary | ICD-10-CM

## 2015-02-14 DIAGNOSIS — I5022 Chronic systolic (congestive) heart failure: Secondary | ICD-10-CM

## 2015-02-14 LAB — CBC
HCT: 24.5 % — ABNORMAL LOW (ref 39.0–52.0)
HEMOGLOBIN: 8 g/dL — AB (ref 13.0–17.0)
MCH: 30.3 pg (ref 26.0–34.0)
MCHC: 32.7 g/dL (ref 30.0–36.0)
MCV: 92.8 fL (ref 78.0–100.0)
Platelets: 133 10*3/uL — ABNORMAL LOW (ref 150–400)
RBC: 2.64 MIL/uL — AB (ref 4.22–5.81)
RDW: 21.3 % — ABNORMAL HIGH (ref 11.5–15.5)
WBC: 5.5 10*3/uL (ref 4.0–10.5)

## 2015-02-14 LAB — BASIC METABOLIC PANEL
Anion gap: 2 — ABNORMAL LOW (ref 5–15)
BUN: 30 mg/dL — ABNORMAL HIGH (ref 6–23)
CO2: 30 mmol/L (ref 19–32)
Calcium: 8 mg/dL — ABNORMAL LOW (ref 8.4–10.5)
Chloride: 107 mmol/L (ref 96–112)
Creatinine, Ser: 1.4 mg/dL — ABNORMAL HIGH (ref 0.50–1.35)
GFR calc Af Amer: 55 mL/min — ABNORMAL LOW (ref >90)
GFR, EST NON AFRICAN AMERICAN: 48 mL/min — AB (ref >90)
Glucose, Bld: 107 mg/dL — ABNORMAL HIGH (ref 70–99)
POTASSIUM: 4.2 mmol/L (ref 3.5–5.1)
SODIUM: 139 mmol/L (ref 135–145)

## 2015-02-14 LAB — PREPARE RBC (CROSSMATCH)

## 2015-02-14 LAB — PROTIME-INR
INR: 2.73 — ABNORMAL HIGH (ref 0.00–1.49)
PROTHROMBIN TIME: 29.1 s — AB (ref 11.6–15.2)

## 2015-02-14 LAB — LACTATE DEHYDROGENASE: LDH: 192 U/L (ref 94–250)

## 2015-02-14 MED ORDER — FUROSEMIDE 10 MG/ML IJ SOLN
20.0000 mg | Freq: Once | INTRAMUSCULAR | Status: AC
  Administered 2015-02-14: 20 mg via INTRAVENOUS
  Filled 2015-02-14: qty 2

## 2015-02-14 MED ORDER — SODIUM CHLORIDE 0.9 % IV SOLN
Freq: Once | INTRAVENOUS | Status: AC
  Administered 2015-02-14: 09:00:00 via INTRAVENOUS

## 2015-02-14 NOTE — Progress Notes (Signed)
Patient ID: [name], male   DOB: 03/29/1941, 74 y.o.   MRN: [id number] HeartMate 2 Rounding Note  Subjective:    Admitted with suspected upper GI bleeding (dark stool but no change from prior given Fe use).  Profoundly weak and hemoglobin 5.4 at admission  Has gotten 4u RBCs. Hgb now 8.0. No obvious bleeding. Plan for EGD today.  He feels much better today.   LVAD INTERROGATION:  HeartMate II LVAD:  Flow 5.0 liters/min, speed 9200, power 5.5, PI 5.8.  Several PI events since admission  Objective:    Vital Signs:   Temp:  [97.9 F (36.6 C)-98.8 F (37.1 C)] 98.1 F (36.7 C) (04/11 0400) Pulse Rate:  [70] 70 (04/10 0805) Resp:  [16-18] 18 (04/11 0400) BP: (88-92)/(0) 90/0 mmHg (04/11 0400) SpO2:  [92 %-100 %] 94 % (04/11 0400) Weight:  [90.1 kg (198 lb 10.2 oz)] 90.1 kg (198 lb 10.2 oz) (04/11 0500) Last BM Date: 02/12/15 Mean arterial Pressure 88-90  Intake/Output:   Intake/Output Summary (Last 24 hours) at 02/14/15 0648 Last data filed at 02/14/15 0645  Gross per 24 hour  Intake 785.33 ml  Output   1625 ml  Net -839.67 ml     Physical Exam: General:  Well appearing. No resp difficulty HEENT: normal Neck: supple. JVP 8-9. Carotids 2+ bilat; no bruits. No lymphadenopathy or thryomegaly appreciated. Cor: Mechanical heart sounds with LVAD hum present. Lungs: clear Abdomen: soft, nontender, nondistended. No hepatosplenomegaly. No bruits or masses. Good bowel sounds. Driveline: C/D/I; securement device intact and driveline incorporated Extremities: no cyanosis, clubbing, rash, edema Neuro: alert & orientedx3, cranial nerves grossly intact. moves all 4 extremities w/o difficulty. Affect pleasant  Telemetry: a-paced, v-sensed 70s  Labs: Basic Metabolic Panel:  Recent Labs Lab 02/12/15 2215 02/12/15 2244 02/13/15 0545 02/14/15 0426  NA 137 138 139 PENDING  K 4.5 4.4 4.0 PENDING  CL 106 104 106 PENDING  CO2 24  --  28 30  GLUCOSE 127* 122* 107* 107*  BUN  38* 38* 33* 30*  CREATININE 1.71* 1.60* 1.50* 1.40*  CALCIUM 8.4  --  7.8* 8.0*    Liver Function Tests:  Recent Labs Lab 02/12/15 2215  AST 25  ALT 18  ALKPHOS 70  BILITOT 0.4  PROT 5.1*  ALBUMIN 3.0*   No results for input(s): LIPASE, AMYLASE in the last 168 hours. No results for input(s): AMMONIA in the last 168 hours.  CBC:  Recent Labs Lab 02/11/15 1303 02/12/15 2215 02/12/15 2244 02/13/15 0545 02/13/15 1931 02/14/15 0426  WBC 7.0 7.1  --  6.4 8.0 5.5  NEUTROABS  --  4.8  --   --   --   --   HGB 7.3* 5.5* 5.4* 6.9* 8.8* 8.0*  HCT 22.8* 16.7* 16.0* 21.2* 26.4* 24.5*  MCV 97.9 102.5*  --  93.4 93.0 92.8  PLT 199 169  --  138* 144* 133*    INR:  Recent Labs Lab 02/08/15 0241 02/12/15 2215 02/13/15 0545 02/14/15 0426  INR 2.5* 2.75* 2.61* 2.73*    Other results:    Imaging: Dg Chest Port 1 View  02/12/2015   CLINICAL DATA:  Shortness of breath.  EXAM: PORTABLE CHEST - 1 VIEW  COMPARISON:  Two-view chest x-ray 09/20/2014  FINDINGS: The heart is enlarged. Pacing and defibrillator wires are stable in position. The left ventricular assist device is stable. There is no edema or effusion to suggest failure. No focal airspace consolidation is present. Atherosclerotic changes are present at  the aortic arch.  IMPRESSION: 1. Stable cardiomegaly without failure. 2. Stable support apparatus. 3. No focal airspace disease.   Electronically Signed   By: San Morelle M.D.   On: 02/12/2015 23:05     Medications:     Scheduled Medications: . sodium chloride  10 mL/hr Intravenous Once  . sodium chloride   Intravenous Once  . amiodarone  200 mg Oral BID  . atorvastatin  80 mg Oral Daily  . docusate sodium  200 mg Oral Daily  . ferrous gluconate  324 mg Oral TID WC  . levothyroxine  175 mcg Oral QAC breakfast  . magnesium gluconate  1,000 mg Oral Daily  . pantoprazole (PROTONIX) IV  40 mg Intravenous Q12H  . ranolazine  500 mg Oral BID  . sodium chloride  3  mL Intravenous Q12H    Infusions:    PRN Medications: sodium chloride, nitroGLYCERIN, promethazine, sodium chloride   Assessment:  1. 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 CAD 3. Paroxysmal atrial fibrillation 4. Nephrolithiasis 5. GI bleeding: FOBT + with symptomatic anemia  Plan/Discussion:    Improved with transfusion but Hgb still a bit low. Will give 1 more unit RBCs. Off coumadin but INR trending up slowly. Will continue to follow. Prefer not to reverse with VAD in place and no evidence of active brisk bleeding.   For EGD today. Hopefully will also do push endoscopy.  Volume status stable.    I reviewed the LVAD parameters from today, and compared the results to the patient's prior recorded data.  No programming changes were made.  The LVAD is functioning within specified parameters.  The patient performs LVAD self-test daily.  LVAD interrogation was negative for any significant power changes, alarms or PI events/speed drops.  LVAD equipment check completed and is in good working order.  Back-up equipment present.   LVAD education done on emergency procedures and precautions and reviewed exit site care.  Length of Stay: 2  Glori Bickers 02/14/2015, 6:48 AM  VAD Team --- VAD ISSUES ONLY--- Pager [phone] (7am - 7am)  Advanced Heart Failure Team  Pager [phone] (M-F; 7a - 4p)  Please contact [facility] for night-coverage after hours (4p -7a ) and weekends on [email or web address]

## 2015-02-14 NOTE — Progress Notes (Signed)
Admitted 02/12/15 due to suspected GI Bleeding/anemia (HgB 5.4).   HeartMate II LVAD implated on 10/19/13 by Dr. Cyndia Bent.   Vital signs: Temp: 98.1 - 98.7 deg F HR: 70 APaced Doppler MAP: 88 - 90 Automated BP: n/a O2 Sat: 94 - 98 % RA Wt:  198 lb. 10.2oz    LVAD interrogation reveals:  Speed: 9200 Flow: 4.6 - 5.5 Power: 5 - 6 PI: 5.1 - 7.2 Alarms: none Events: No events today; 2 events 4/10  Fixed speed: 9200 Low speed limit: 8600  Drive Line: Site being changed weekly by his wife. He is unsure of last dressing change. Will discuss with his significant other as to when it is due.   Labs:  LDH trend: 219 - 194 - 192  INR trend: 2.75 - 2.61 - 2.73  Hgb trend: 5.5 - 6.9 - 8.8 - 8.0  Blood Products:  02/12/15--2 u PRBC 02/13/15--2 u PRBC 02/14/15--1 u PRBC  [name] is sitting up in his bed this morning getting another unit of blood. Feeling much better since he has been transfused. He is on chronic iron for anemia and reports that he did not notice changes to BM's in the setting of this acute GI Bleed (+FOBT in ED) and takes stool softeners every morning. Ready to get his procedure done so we can figure out what is bleeding and fix it. ASA and coumadin on hold currently. He also reports that the symptoms he had Friday r/t suspected sinusitis have resolved.   Plan/Recommendations & D/C Needs Discussed w/ VAD Team:  1. Planning for EGD today--not yet scheduled.   2. Nursing staff to assist with identifying when dressing needs to be changed, please.   3. Trend labs. Previous anticoagulation goal was 2.0 - 2.5 and 81 mg ASA d/t complicated hospital course after his VAD implant with anemia and dropping hematocrit.   4. Ambulate with Cardiac Rehab team and prevent deconditioning.

## 2015-02-14 NOTE — Progress Notes (Signed)
CARDIAC REHAB PHASE I   PRE:  Rate/Rhythm: a paced 70  BP:  Supine:   Sitting: 88 MAP  Standing:    SaO2: 97%RA  MODE:  Ambulation: 150 ft   POST:  Rate/Rhythm: 70 paced  BP:  Supine:   Sitting: 92 MAP  Standing:    SaO2: 96%RA 0955-1014 Pt walked 150 ft on RA with rollator and minimal asst with steady gait. Tolerated well with no dizziness. Pt put self to batteries for walk and then back to power source without difficulty. Back up bag taken with [place]. Back to sitting on side of bed after walk.    Graylon Good, RN BSN  02/14/2015 10:19 AM

## 2015-02-14 NOTE — Progress Notes (Addendum)
Unassigned patient Subjective: Patient was supposed to have an EGD done today for further evaluation of anemia and ?melena. As we could not get MAC for sedation the procedure has been postponed till tomorrow. Patient denies having any abdominal pain, nausea or vomiting. He has been on iron and therefore has black stools. He gives a history of having a normal colonoscopy about 5 years ago, done at [facility].    Objective: Vital signs in last 24 hours: Temp:  [98.1 F (36.7 C)-98.8 F (37.1 C)] 98.8 F (37.1 C) (04/11 1315) Resp:  [18] 18 (04/11 0915) BP: (88-98)/(0) 96/0 mmHg (04/11 1315) SpO2:  [94 %-95 %] 95 % (04/11 1315) Weight:  [90.1 kg (198 lb 10.2 oz)] 90.1 kg (198 lb 10.2 oz) (04/11 0500) Last BM Date: 02/14/15  Intake/Output from previous day: 04/10 0701 - 04/11 0700 In: 785.3 [I.V.:30; Blood:755.3] Out: 1625 [Urine:1625] Intake/Output this shift: Total I/O In: 580 [Blood:580] Out: 600 [Urine:600]  General appearance: alert, cooperative, appears stated age and no distress Resp: clear to auscultation bilaterally Cardio: mechanical hearts sounds present GI: soft, non-tender; bowel sounds normal; no masses,  no organomegaly  Lab Results:  Recent Labs  02/13/15 0545 02/13/15 1931 02/14/15 0426  WBC 6.4 8.0 5.5  HGB 6.9* 8.8* 8.0*  HCT 21.2* 26.4* 24.5*  PLT 138* 144* 133*   BMET  Recent Labs  02/12/15 2215 02/12/15 2244 02/13/15 0545 02/14/15 0426  NA 137 138 139 139  K 4.5 4.4 4.0 4.2  CL 106 104 106 107  CO2 24  --  28 30  GLUCOSE 127* 122* 107* 107*  BUN 38* 38* 33* 30*  CREATININE 1.71* 1.60* 1.50* 1.40*  CALCIUM 8.4  --  7.8* 8.0*   LFT  Recent Labs  02/12/15 2215  PROT 5.1*  ALBUMIN 3.0*  AST 25  ALT 18  ALKPHOS 70  BILITOT 0.4   PT/INR  Recent Labs  02/13/15 0545 02/14/15 0426  LABPROT 28.1* 29.1*  INR 2.61* 2.73*   Studies/Results: Dg Chest Port 1 View  02/12/2015   CLINICAL DATA:  Shortness of breath.  EXAM:  PORTABLE CHEST - 1 VIEW  COMPARISON:  Two-view chest x-ray 09/20/2014  FINDINGS: The heart is enlarged. Pacing and defibrillator wires are stable in position. The left ventricular assist device is stable. There is no edema or effusion to suggest failure. No focal airspace consolidation is present. Atherosclerotic changes are present at the aortic arch.  IMPRESSION: 1. Stable cardiomegaly without failure. 2. Stable support apparatus. 3. No focal airspace disease.   Electronically Signed   By: San Morelle M.D.   On: 02/12/2015 23:05   Medications: I have reviewed the patient's current medications.  Assessment/Plan: 1) Iron defiiciency anemia with guaiac positive stools: Will plan to do an EGD tomorrow with MAC.  2) CAD/Ischemic cardiomyopathy/history of V. Tachycardia/Chronic systolic heart failure: has an LVAD. 3) Chronic kidney disease.  4) HTN/Hyperlipidemia..   LOS: 2 days   [name] 02/14/2015, 5:22 PM

## 2015-02-15 ENCOUNTER — Inpatient Hospital Stay (HOSPITAL_COMMUNITY): Payer: Medicare Other | Admitting: Certified Registered"

## 2015-02-15 ENCOUNTER — Encounter (HOSPITAL_COMMUNITY): Payer: Self-pay | Admitting: *Deleted

## 2015-02-15 ENCOUNTER — Encounter (HOSPITAL_COMMUNITY)
Admission: EM | Disposition: A | Discharge status: Home / Self Care | Payer: Self-pay | Source: Home / Self Care | Attending: Cardiology

## 2015-02-15 DIAGNOSIS — K2961 Other gastritis with bleeding: Secondary | ICD-10-CM

## 2015-02-15 HISTORY — PX: ESOPHAGOGASTRODUODENOSCOPY: SHX5428

## 2015-02-15 LAB — CBC
HEMATOCRIT: 28.7 % — AB (ref 39.0–52.0)
HEMOGLOBIN: 9.4 g/dL — AB (ref 13.0–17.0)
MCH: 30.5 pg (ref 26.0–34.0)
MCHC: 32.8 g/dL (ref 30.0–36.0)
MCV: 93.2 fL (ref 78.0–100.0)
PLATELETS: 149 10*3/uL — AB (ref 150–400)
RBC: 3.08 MIL/uL — ABNORMAL LOW (ref 4.22–5.81)
RDW: 21.7 % — ABNORMAL HIGH (ref 11.5–15.5)
WBC: 5.7 10*3/uL (ref 4.0–10.5)

## 2015-02-15 LAB — TYPE AND SCREEN
ABO/RH(D): O POS
ANTIBODY SCREEN: NEGATIVE
UNIT DIVISION: 0
UNIT DIVISION: 0
UNIT DIVISION: 0
Unit division: 0
Unit division: 0

## 2015-02-15 LAB — BASIC METABOLIC PANEL
Anion gap: 7 (ref 5–15)
BUN: 20 mg/dL (ref 6–23)
CALCIUM: 8.3 mg/dL — AB (ref 8.4–10.5)
CO2: 25 mmol/L (ref 19–32)
Chloride: 107 mmol/L (ref 96–112)
Creatinine, Ser: 1.33 mg/dL (ref 0.50–1.35)
GFR calc Af Amer: 59 mL/min — ABNORMAL LOW (ref >90)
GFR calc non Af Amer: 51 mL/min — ABNORMAL LOW (ref >90)
GLUCOSE: 106 mg/dL — AB (ref 70–99)
Potassium: 4.3 mmol/L (ref 3.5–5.1)
SODIUM: 139 mmol/L (ref 135–145)

## 2015-02-15 LAB — PROTIME-INR
INR: 1.98 — AB (ref 0.00–1.49)
Prothrombin Time: 22.7 seconds — ABNORMAL HIGH (ref 11.6–15.2)

## 2015-02-15 LAB — LACTATE DEHYDROGENASE: LDH: 224 U/L (ref 94–250)

## 2015-02-15 SURGERY — EGD (ESOPHAGOGASTRODUODENOSCOPY)
Anesthesia: Monitor Anesthesia Care

## 2015-02-15 MED ORDER — PROPOFOL 10 MG/ML IV BOLUS
INTRAVENOUS | Status: DC | PRN
  Administered 2015-02-15 (×8): 10 mg via INTRAVENOUS

## 2015-02-15 MED ORDER — LACTATED RINGERS IV SOLN
INTRAVENOUS | Status: DC | PRN
  Administered 2015-02-15: 12:00:00 via INTRAVENOUS

## 2015-02-15 MED ORDER — SODIUM CHLORIDE 0.9 % IV SOLN: INTRAVENOUS | Status: DC

## 2015-02-15 MED ORDER — WARFARIN SODIUM 4 MG PO TABS
4.0000 mg | ORAL_TABLET | Freq: Once | ORAL | Status: AC
  Administered 2015-02-15: 4 mg via ORAL
  Filled 2015-02-15: qty 1

## 2015-02-15 MED ORDER — PHENYLEPHRINE HCL 10 MG/ML IJ SOLN
INTRAMUSCULAR | Status: DC | PRN
  Administered 2015-02-15 (×4): 40 ug via INTRAVENOUS
  Administered 2015-02-15: 80 ug via INTRAVENOUS
  Administered 2015-02-15: 40 ug via INTRAVENOUS
  Administered 2015-02-15: 80 ug via INTRAVENOUS

## 2015-02-15 MED ORDER — WARFARIN - PHARMACIST DOSING INPATIENT
Freq: Every day | Status: DC
  Administered 2015-02-16: 17:00:00

## 2015-02-15 MED ORDER — BUTAMBEN-TETRACAINE-BENZOCAINE 2-2-14 % EX AERO
INHALATION_SPRAY | CUTANEOUS | Status: DC | PRN
  Administered 2015-02-15: 2 via TOPICAL

## 2015-02-15 MED ORDER — LACTATED RINGERS IV SOLN
INTRAVENOUS | Status: DC
  Administered 2015-02-15: 1000 mL via INTRAVENOUS

## 2015-02-15 MED ORDER — LIDOCAINE HCL (CARDIAC) 20 MG/ML IV SOLN
INTRAVENOUS | Status: DC | PRN
  Administered 2015-02-15: 50 mg via INTRAVENOUS

## 2015-02-15 MED ORDER — FENTANYL CITRATE 0.05 MG/ML IJ SOLN
INTRAMUSCULAR | Status: DC | PRN
  Administered 2015-02-15: 50 ug via INTRAVENOUS

## 2015-02-15 NOTE — Progress Notes (Signed)
Patient ID: [name], male   DOB: 1941-02-20, 74 y.o.   MRN: [id number] HeartMate 2 Rounding Note  Subjective:    Admitted with suspected upper GI bleeding (dark stool but no change from prior given Fe use).  Profoundly weak and hemoglobin 5.4 at admission  Has gotten 5u RBCs. CBC/BMET  pending.  Feels good. No obvious bleeding. Plan for EGD today.  INR 1.98 . Denies SOB.   LVAD INTERROGATION:  HeartMate II LVAD:  Flow 4.4 liters/min, speed 9200, power 5, PI 6.6.  Rare PI over night.  Objective:    Vital Signs:   Temp:  [98.2 F (36.8 C)-99.1 F (37.3 C)] 98.5 F (36.9 C) (04/12 0430) Pulse Rate:  [70-90] 70 (04/12 0430) Resp:  [12-20] 12 (04/12 0430) BP: (96-98)/(0) 96/0 mmHg (04/11 1315) SpO2:  [91 %-95 %] 91 % (04/12 0430) Weight:  [192 lb 14.4 oz (87.5 kg)] 192 lb 14.4 oz (87.5 kg) (04/12 0430) Last BM Date: 02/14/15 Mean arterial Pressure 88  Intake/Output:   Intake/Output Summary (Last 24 hours) at 02/15/15 0831 Last data filed at 02/15/15 0245  Gross per 24 hour  Intake    580 ml  Output   1300 ml  Net   -720 ml     Physical Exam: General:  Well appearing. No resp difficulty HEENT: normal Neck: supple. JVP 5-6. Carotids 2+ bilat; no bruits. No lymphadenopathy or thryomegaly appreciated. Cor: Mechanical heart sounds with LVAD hum present. Lungs: clear Abdomen: soft, nontender, nondistended. No hepatosplenomegaly. No bruits or masses. Good bowel sounds. Driveline: C/D/I; securement device intact and driveline incorporated Extremities: no cyanosis, clubbing, rash, edema Neuro: alert & orientedx3, cranial nerves grossly intact. moves all 4 extremities w/o difficulty. Affect pleasant  Telemetry: a-paced, v-sensed 70s  Labs: Basic Metabolic Panel:  Recent Labs Lab 02/12/15 2215 02/12/15 2244 02/13/15 0545 02/14/15 0426  NA 137 138 139 139  K 4.5 4.4 4.0 4.2  CL 106 104 106 107  CO2 24  --  28 30  GLUCOSE 127* 122* 107* 107*  BUN 38* 38* 33* 30*   CREATININE 1.71* 1.60* 1.50* 1.40*  CALCIUM 8.4  --  7.8* 8.0*    Liver Function Tests:  Recent Labs Lab 02/12/15 2215  AST 25  ALT 18  ALKPHOS 70  BILITOT 0.4  PROT 5.1*  ALBUMIN 3.0*   No results for input(s): LIPASE, AMYLASE in the last 168 hours. No results for input(s): AMMONIA in the last 168 hours.  CBC:  Recent Labs Lab 02/11/15 1303 02/12/15 2215 02/12/15 2244 02/13/15 0545 02/13/15 1931 02/14/15 0426  WBC 7.0 7.1  --  6.4 8.0 5.5  NEUTROABS  --  4.8  --   --   --   --   HGB 7.3* 5.5* 5.4* 6.9* 8.8* 8.0*  HCT 22.8* 16.7* 16.0* 21.2* 26.4* 24.5*  MCV 97.9 102.5*  --  93.4 93.0 92.8  PLT 199 169  --  138* 144* 133*    INR:  Recent Labs Lab 02/12/15 2215 02/13/15 0545 02/14/15 0426 02/15/15 0335  INR 2.75* 2.61* 2.73* 1.98*    Other results:    Imaging: No results found.   Medications:     Scheduled Medications: . sodium chloride  10 mL/hr Intravenous Once  . sodium chloride   Intravenous Once  . amiodarone  200 mg Oral BID  . atorvastatin  80 mg Oral Daily  . docusate sodium  200 mg Oral Daily  . ferrous gluconate  324 mg Oral TID WC  .  levothyroxine  175 mcg Oral QAC breakfast  . magnesium gluconate  1,000 mg Oral Daily  . pantoprazole (PROTONIX) IV  40 mg Intravenous Q12H  . ranolazine  500 mg Oral BID  . sodium chloride  3 mL Intravenous Q12H    Infusions: . sodium chloride      PRN Medications: sodium chloride, nitroGLYCERIN, promethazine, sodium chloride   Assessment:  1. 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 CAD 3. Paroxysmal atrial fibrillation 4. Nephrolithiasis 5. GI bleeding: FOBT + with symptomatic anemia  Plan/Discussion:    Overall he has received 5UPRBCs. CBC/BMET pending.  For EGD today. Hopefully will also do push endoscopy.  Volume status stable.    I reviewed the LVAD parameters from today, and compared the results to the patient's prior recorded  data.  No programming changes were made.  The LVAD is functioning within specified parameters.  The patient performs LVAD self-test daily.  LVAD interrogation was negative for any significant power changes, alarms or PI events/speed drops.  LVAD equipment check completed and is in good working order.  Back-up equipment present.   LVAD education done on emergency procedures and precautions and reviewed exit site care.  Length of Stay: 3  CLEGG,AMY 02/15/2015, 8:31 AM  VAD Team --- VAD ISSUES ONLY--- Pager [phone] (7am - 7am)  Advanced Heart Failure Team  Pager [phone] (M-F; 7a - 4p)  Please contact [facility] for night-coverage after hours (4p -7a ) and weekends on [email or web address]  Doing well. Hgb up to 9.4. No overt bleeding. Diuresed well. For EGD today with push enteroscopy. Discussed with Dr. Benson Norway,   VAD parameters stable.   Frank Vitelli,MD 1:27 PM

## 2015-02-15 NOTE — Op Note (Signed)
[facility] [place], [id number]   ENDOSCOPY PROCEDURE REPORT  PATIENT: Frank, [name]  MR#: [id number] BIRTHDATE: 11-13-1940 , 73  yrs. old GENDER: male ENDOSCOPIST:Livana Yerian Benson Norway, MD REFERRED BY: PROCEDURE DATE:  Mar 07, 2015 PROCEDURE:   Enteroscopy with APC ASA CLASS:    Class III INDICATIONS: melena. MEDICATION: Monitored anesthesia care TOPICAL ANESTHETIC:   none  DESCRIPTION OF PROCEDURE:   After the risks and benefits of the procedure were explained, informed consent was obtained.  The Pentax Gastroscope [id number]  endoscope was introduced through the mouth  and advanced to the second portion of the duodenum .  The instrument was slowly withdrawn as the mucosa was fully examined. Estimated blood loss is zero unless otherwise noted in this procedure report.   FINDINGS: Deep intubation of the proximal jejunum was achieved.  Two passes were performed and the second pass the pediatric colonoscope was able to be pushed in futher.  Two lesions were identified and both lesions were atypical.  The distal lesion only exhibited multiple punctate erythema that wrapped around a jejunal fold. While ablating the lesion some minor bleeding was induced, which confirmed that this was a true vascular lesion.  Complete ablation of this lesion was achieved.  The proximal lesion was also atypical in appearance, but it was easily ablated.  No bleeding was induced at this site.  No evidence of any ulcerations, erosions, polyps, or masses.  The esohpagus and gastric lumen were normal. Retroflexed views revealed no abnormalities.    The scope was then withdrawn from the patient and the procedure completed.  COMPLICATIONS: There were no immediate complications.  ENDOSCOPIC IMPRESSION: 1) Two jejunal AVMs s/p APC.  RECOMMENDATIONS: 1) Okay to resume coumadin.  The cardiac risks off of the medication are higher than the risks of bleeding.  Some bleeding  should be expected as the cauterization sites are in the process of healing. 2) Follow HGB and transfuse as necessary.   _______________________________ eSignedCarol Ada, MD 03-07-2015 1:00 PM     cc:  CPT CODES: ICD CODES:  The ICD and CPT codes recommended by this software are interpretations from the data that the clinical staff has captured with the software.  The verification of the translation of this report to the ICD and CPT codes and modifiers is the sole responsibility of the health care institution and practicing physician where this report was generated.  [place]. will not be held responsible for the validity of the ICD and CPT codes included on this report.  AMA assumes no liability for data contained or not contained herein. CPT is a Designer, television/film set of the [employer].  PATIENT NAME:  Frank, [name] MR#: [id number]

## 2015-02-15 NOTE — Progress Notes (Signed)
Admitted 02/12/15 due to suspected GI Bleeding/anemia (HgB 5.4).   HeartMate II LVAD implated on 10/19/13 by Dr. Cyndia Bent.   Vital signs: Temp: 98.2 - 99.0 deg F HR: 70 APaced Doppler MAP: 91 - 99 Automated BP: n/a O2 Sat: 94 - 98 % RA Wt in lbs:   198 > 192   LVAD interrogation reveals:  Speed: 9200 Flow: 4.4 Power: 5.2 PI:  6.3 Alarms: none Events: rare PI Fixed speed: 9200 Low speed limit: 8600  Drive Line: Site being changed weekly by his wife. He is unsure of last dressing change.   Labs:  LDH trend: 219 - 194 - 192 - 224  INR trend: 2.75 - 2.61 - 2.73 -1.98  Hgb trend: 5.5 - 6.9 - 8.8 - 8.0 - 9.4  Blood Products:  02/12/15--2 u PRBC 02/13/15--2 u PRBC 02/14/15--1 u PRBC  LD Coumadin:  02/12/15; plan to re-start coumadin at 4 mg this evening  LD ASA:  02/12/15   Plan/Recommendations & D/C Needs Discussed w/ VAD Team:  1. Planning for EGD today--confirmed with Dr. Benson Norway that INR 1.9 is acceptable.   2. Re-start coumadin; hold ASA for now; monitor Hgb.  3. Ambulate with Cardiac Rehab team and prevent deconditioning.

## 2015-02-15 NOTE — Progress Notes (Signed)
Medicare Important Message given? YES  (If response is "NO", the following Medicare IM given date fields will be blank)  Date Medicare IM given: 02/15/15 Medicare IM given by:  Dahlia Client [employer]

## 2015-02-15 NOTE — Transfer of Care (Signed)
Immediate Anesthesia Transfer of Care Note  Patient: [name]  Procedure(s) Performed: Procedure(s) with comments: ESOPHAGOGASTRODUODENOSCOPY (EGD) (N/A) - LVAD  Patient Location: Endoscopy Unit  Anesthesia Type:MAC  Level of Consciousness: awake, alert  and oriented  Airway & Oxygen Therapy: Patient connected to nasal cannula oxygen  Post-op Assessment: Post -op Vital signs reviewed and stable  Post vital signs: stable  Last Vitals:  Filed Vitals:   02/15/15 1250  BP: 0/0  Pulse:   Temp:   Resp:     Complications: No apparent anesthesia complications

## 2015-02-15 NOTE — Progress Notes (Signed)
VAD coordinator paged to endoscopy lab. 2W nurse accompanied pt to GI holding; VAD coordinator stayed with pt for procedure. Push enteroscopy performed by Dr. Benson Norway. Anesthesia support by Dr. [name] and Dollene Cleveland, CRNA for procedure.   VAD parameters and MAPs stable pre, intra, and post procedure:  Speed:  9200 Flow:  4.0 - 4.6 PI:  7.2 - 5.5 Power:  5.1 - 5.6 Events:  none Alarms:  None MAP with doppler:  88 - 104  Pt awake shortly after procedure ended, alert and oriented.  Dr. Benson Norway called wife with report. VAD coordinator accompanied pt back to 2W20.  Dr. Darcey Nora in room to evaluate patient.

## 2015-02-15 NOTE — Progress Notes (Signed)
CARDIAC REHAB PHASE I   PRE:  Rate/Rhythm: 70 pacing    BP: sitting 86    SaO2:   MODE:  Ambulation: 350 ft   POST:  Rate/Rhythm: 83 pacing    BP: sitting 80     SaO2:   Tolerated well, independent with RW. No c/o.  [id number]   Josephina Shih [place] CES, ACSM 02/15/2015 10:08 AM

## 2015-02-15 NOTE — Progress Notes (Signed)
ANTICOAGULATION CONSULT NOTE - Initial Consult  Pharmacy Consult for Coumadin Indication: LVAD  Allergies  Allergen Reactions  . Demerol [Meperidine] Other (See Comments)    Paralysis. Could only move eyes.  . Meperidine Hcl Other (See Comments)    "CAN'T MOVE" CATATONIC PER PT.  [name] Opium Other (See Comments)    "CAN'T MOVE" CATATONIC PER PT.    Patient Measurements: Height: 6' (182.9 cm) Weight: 192 lb 14.4 oz (87.5 kg) IBW/kg (Calculated) : 77.6  Vital Signs: Temp: 98 F (36.7 C) (04/12 1256) Temp Source: Oral (04/12 1256) BP: 0/0 mmHg (04/12 1250) Pulse Rate: 70 (04/12 0430)  Labs:  Recent Labs  02/13/15 0545 02/13/15 1931 02/14/15 0426 02/15/15 0335 02/15/15 0935  HGB 6.9* 8.8* 8.0*  --  9.4*  HCT 21.2* 26.4* 24.5*  --  28.7*  PLT 138* 144* 133*  --  149*  LABPROT 28.1*  --  29.1* 22.7*  --   INR 2.61*  --  2.73* 1.98*  --   CREATININE 1.50*  --  1.40*  --  1.33    Estimated Creatinine Clearance: 52.7 mL/min (by C-G formula based on Cr of 1.33).   Medical History: Past Medical History  Diagnosis Date  . Bradycardia   . Ventricular tachycardia     s/p AICD;   h/o ICD shock;  Amiodarone Rx. S/P ablation in 2012  . 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 HTN (hypertension)   . HLD (hyperlipidemia)   . Cytopenia   . Hypothyroidism   . Renal insufficiency   . Claudication   . Chronic systolic heart failure   . Ischemic cardiomyopathy     echo 7/12:  EF 25-30%, mild AI, mod MR, mod LAE  . TIA (transient ischemic attack) 2001  . CVD (cerebrovascular disease)     left CEA 2008  . Stroke   . Anemia   . Inappropriate therapy from implantable cardioverter-defibrillator 04/02/2013    ATP for sinus tach  SVT wavelet identified SVT but was no  passive   . Myocardial infarction 1992  . Anginal pain   . Automatic implantable  cardioverter-defibrillator in situ     Medtronic Evera device serial number [id number]    . Arthritis     "a little in my knees" (05/25/2013)  . Melanoma of ear 2013    "near left ear" (05/25/2013)  . 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 Dysrhythmia     HX A FIB  . History of nonmelanoma skin cancer     L EAR  . Kidney stone   . History of transfusion   . Sleep apnea     denies wearing mask (05/25/2013)  . CAD (coronary artery disease)     s/p MI 1982;  s/p DES to CFX 2011;  s/p NSTEMI 4/12 in setting of VTach;  cath 7/12:  LM ok, LAD mid 30-40%, Dx's with 40%, mCFX stent patent, prox to mid RCA occluded with R to R and L to R collats.  Medical Tx was continued  . CHF (congestive heart failure)     HAS VENTRICULAR PUMP   Assessment: 73yom on coumadin pta for LVAD (10/19/13) admitted 4/9 with suspected UGIB and Hgb 5.4. INR on admission was 2.75 and coumadin was placed on hold. He received a  total of 5 units PRBCs. He underwent EGD today which showed two jejunal AVMs s/p APC. Per GI it is ok to resume coumadin. INR today has fallen to 1.98 - no reversal agents given. Hgb is also stable at 9.4.  Home dose: 2mg  daily except 4mg  MWF  Goal of Therapy:  INR 2-3 Monitor platelets by anticoagulation protocol: Yes   Plan:  1) Coumadin 4mg  x 1 2) Daily INR, CBC  Deboraha Sprang 02/15/2015,1:33 PM

## 2015-02-15 NOTE — Anesthesia Preprocedure Evaluation (Addendum)
Anesthesia Evaluation  Patient identified by MRN, date of birth, ID band Patient awake    Reviewed: Allergy & Precautions, NPO status , Patient's Chart, lab work & pertinent test results  History of Anesthesia Complications (+) AWARENESS UNDER ANESTHESIA  Airway Mallampati: II  TM Distance: >3 FB Neck ROM: Full    Dental  (+) Edentulous Upper, Edentulous Lower   Pulmonary shortness of breath, sleep apnea , former smoker,  breath sounds clear to auscultation        Cardiovascular hypertension, + angina + CAD, + Past MI, + Peripheral Vascular Disease and +CHF + dysrhythmias + Cardiac Defibrillator Rhythm:Regular  VAD hum   Neuro/Psych TIACVA, No Residual Symptoms negative psych ROS   GI/Hepatic Neg liver ROS, Question gi bleed   Endo/Other  Hypothyroidism   Renal/GU Renal disease     Musculoskeletal  (+) Arthritis -,   Abdominal   Peds  Hematology  (+) anemia ,   Anesthesia Other Findings   Reproductive/Obstetrics                          Anesthesia Physical Anesthesia Plan  ASA: III  Anesthesia Plan: MAC   Post-op Pain Management:    Induction: Intravenous  Airway Management Planned: Nasal Cannula  Additional Equipment: None  Intra-op Plan:   Post-operative Plan:   Informed Consent: I have reviewed the patients History and Physical, chart, labs and discussed the procedure including the risks, benefits and alternatives for the proposed anesthesia with the patient or authorized representative who has indicated his/her understanding and acceptance.   Dental advisory given  Plan Discussed with: CRNA and Surgeon  Anesthesia Plan Comments:         Anesthesia Quick Evaluation

## 2015-02-15 NOTE — Anesthesia Postprocedure Evaluation (Signed)
  Anesthesia Post-op Note  Patient: [name]  Procedure(s) Performed: Procedure(s) with comments: ESOPHAGOGASTRODUODENOSCOPY (EGD) (N/A) - LVAD  Patient Location: Endoscopy Unit  Anesthesia Type:MAC  Level of Consciousness: awake, alert  and oriented  Airway and Oxygen Therapy: Patient Spontanous Breathing  Post-op Pain: none  Post-op Assessment: Post-op Vital signs reviewed, Patient's Cardiovascular Status Stable, Respiratory Function Stable, Patent Airway, No signs of Nausea or vomiting and Pain level controlled  Post-op Vital Signs: Reviewed and stable  Last Vitals:  Filed Vitals:   02/15/15 1256  BP:   Pulse:   Temp: 36.7 C  Resp: 16    Complications: No apparent anesthesia complications

## 2015-02-16 ENCOUNTER — Encounter (HOSPITAL_COMMUNITY): Payer: Self-pay | Admitting: Gastroenterology

## 2015-02-16 LAB — CBC
HEMATOCRIT: 27.2 % — AB (ref 39.0–52.0)
Hemoglobin: 8.8 g/dL — ABNORMAL LOW (ref 13.0–17.0)
MCH: 30.8 pg (ref 26.0–34.0)
MCHC: 32.4 g/dL (ref 30.0–36.0)
MCV: 95.1 fL (ref 78.0–100.0)
Platelets: 134 10*3/uL — ABNORMAL LOW (ref 150–400)
RBC: 2.86 MIL/uL — ABNORMAL LOW (ref 4.22–5.81)
RDW: 21.9 % — AB (ref 11.5–15.5)
WBC: 4.9 10*3/uL (ref 4.0–10.5)

## 2015-02-16 LAB — GLUCOSE, CAPILLARY: Glucose-Capillary: 108 mg/dL — ABNORMAL HIGH (ref 70–99)

## 2015-02-16 LAB — PROTIME-INR
INR: 1.94 — ABNORMAL HIGH (ref 0.00–1.49)
PROTHROMBIN TIME: 22.3 s — AB (ref 11.6–15.2)

## 2015-02-16 LAB — BASIC METABOLIC PANEL
Anion gap: 9 (ref 5–15)
BUN: 19 mg/dL (ref 6–23)
CO2: 24 mmol/L (ref 19–32)
CREATININE: 1.38 mg/dL — AB (ref 0.50–1.35)
Calcium: 8 mg/dL — ABNORMAL LOW (ref 8.4–10.5)
Chloride: 105 mmol/L (ref 96–112)
GFR, EST AFRICAN AMERICAN: 56 mL/min — AB (ref >90)
GFR, EST NON AFRICAN AMERICAN: 48 mL/min — AB (ref >90)
Glucose, Bld: 99 mg/dL (ref 70–99)
Potassium: 4.3 mmol/L (ref 3.5–5.1)
Sodium: 138 mmol/L (ref 135–145)

## 2015-02-16 LAB — LACTATE DEHYDROGENASE: LDH: 213 U/L (ref 94–250)

## 2015-02-16 MED ORDER — WARFARIN SODIUM 4 MG PO TABS
4.0000 mg | ORAL_TABLET | Freq: Once | ORAL | Status: AC
  Administered 2015-02-16: 4 mg via ORAL
  Filled 2015-02-16: qty 1

## 2015-02-16 MED ORDER — AMLODIPINE BESYLATE 10 MG PO TABS
10.0000 mg | ORAL_TABLET | Freq: Every day | ORAL | Status: DC
  Administered 2015-02-16: 10 mg via ORAL
  Filled 2015-02-16 (×2): qty 1

## 2015-02-16 NOTE — Progress Notes (Signed)
CARDIAC REHAB PHASE I   PRE:  Rate/Rhythm: 70 A-paced  BP:  Sitting: 98 MAP        SaO2:   MODE:  Ambulation: 400 ft   POST:  Rate/Rhythm: 70 a-Paced  BP:  Sitting: 88 MAP         SaO2: 95 RA  Pt ambulated 400 feet on RA hand held assist (pt said he didn't feel like he needed a walker today),steady gait. Pt tolerated well, did c/o of feeling "a little winded" towards end of walk. Pt MAP 98 prior to ambulation, RN notified. MAP 88 post ambulation. No other complaints.   [id number]  Lenna Sciara, RN, BSN 02/16/2015 11:17 AM

## 2015-02-16 NOTE — Progress Notes (Signed)
Patient ID: [name], male   DOB: September 01, 1941, 74 y.o.   MRN: [id number] HeartMate 2 Rounding Note  Subjective:    Admitted with suspected upper GI bleeding (dark stool but no change from prior given Fe use).  Profoundly weak and hemoglobin 5.4 at admission  Has gotten 5u RBCs. Had ECG with 2 AVMS in jejunum clipped. Hgb down a little 9.4>8.8. 1 BM black this am.    Denies SOB      LVAD INTERROGATION:  HeartMate II LVAD:  Flow 4.4 liters/min, speed 9200, power 5, PI 6.6.  Rare PI about 3-4 over night.  Objective:    Vital Signs:   Temp:  [98 F (36.7 C)-98.9 F (37.2 C)] 98.5 F (36.9 C) (04/13 0453) Pulse Rate:  [68-80] 68 (04/13 0453) Resp:  [11-19] 11 (04/13 0453) BP: (0-92)/(0) 0/0 mmHg (04/12 1250) SpO2:  [92 %-99 %] 96 % (04/13 0453) Weight:  [194 lb 0.1 oz (88 kg)] 194 lb 0.1 oz (88 kg) (04/13 0453) Last BM Date: 02/15/15 Mean arterial Pressure 86   Intake/Output:   Intake/Output Summary (Last 24 hours) at 02/16/15 0808 Last data filed at 02/15/15 2203  Gross per 24 hour  Intake    500 ml  Output    375 ml  Net    125 ml     Physical Exam: General:  Well appearing. No resp difficulty HEENT: normal Neck: supple. JVP 5-6. Carotids 2+ bilat; no bruits. No lymphadenopathy or thryomegaly appreciated. Cor: Mechanical heart sounds with LVAD hum present. Lungs: clear Abdomen: soft, nontender, nondistended. No hepatosplenomegaly. No bruits or masses. Good bowel sounds. Driveline: C/D/I; securement device intact and driveline incorporated Extremities: no cyanosis, clubbing, rash, edema Neuro: alert & orientedx3, cranial nerves grossly intact. moves all 4 extremities w/o difficulty. Affect pleasant  Telemetry: a-paced, v-sensed 70s  Labs: Basic Metabolic Panel:  Recent Labs Lab 02/12/15 2215 02/12/15 2244 02/13/15 0545 02/14/15 0426 02/15/15 0935 02/16/15 0315  NA 137 138 139 139 139 138  K 4.5 4.4 4.0 4.2 4.3 4.3  CL 106 104 106 107 107 105  CO2  24  --  28 30 25 24   GLUCOSE 127* 122* 107* 107* 106* 99  BUN 38* 38* 33* 30* 20 19  CREATININE 1.71* 1.60* 1.50* 1.40* 1.33 1.38*  CALCIUM 8.4  --  7.8* 8.0* 8.3* 8.0*    Liver Function Tests:  Recent Labs Lab 02/12/15 2215  AST 25  ALT 18  ALKPHOS 70  BILITOT 0.4  PROT 5.1*  ALBUMIN 3.0*   No results for input(s): LIPASE, AMYLASE in the last 168 hours. No results for input(s): AMMONIA in the last 168 hours.  CBC:  Recent Labs Lab 02/12/15 2215  02/13/15 0545 02/13/15 1931 02/14/15 0426 02/15/15 0935 02/16/15 0315  WBC 7.1  --  6.4 8.0 5.5 5.7 4.9  NEUTROABS 4.8  --   --   --   --   --   --   HGB 5.5*  < > 6.9* 8.8* 8.0* 9.4* 8.8*  HCT 16.7*  < > 21.2* 26.4* 24.5* 28.7* 27.2*  MCV 102.5*  --  93.4 93.0 92.8 93.2 95.1  PLT 169  --  138* 144* 133* 149* 134*  < > = values in this interval not displayed.  INR:  Recent Labs Lab 02/12/15 2215 02/13/15 0545 02/14/15 0426 02/15/15 0335 02/16/15 0315  INR 2.75* 2.61* 2.73* 1.98* 1.94*    Other results:    Imaging: No results found.   Medications:  Scheduled Medications: . sodium chloride  10 mL/hr Intravenous Once  . sodium chloride   Intravenous Once  . amiodarone  200 mg Oral BID  . atorvastatin  80 mg Oral Daily  . docusate sodium  200 mg Oral Daily  . ferrous gluconate  324 mg Oral TID WC  . levothyroxine  175 mcg Oral QAC breakfast  . magnesium gluconate  1,000 mg Oral Daily  . pantoprazole (PROTONIX) IV  40 mg Intravenous Q12H  . ranolazine  500 mg Oral BID  . sodium chloride  3 mL Intravenous Q12H  . Warfarin - Pharmacist Dosing Inpatient   Does not apply q1800    Infusions:    PRN Medications: sodium chloride, nitroGLYCERIN, promethazine, sodium chloride   Assessment:  1. 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 CAD 3. Paroxysmal atrial fibrillation 4. Nephrolithiasis 5. GI bleeding: FOBT + with symptomatic  anemia  Plan/Discussion:    Overall he has received 5UPRBCs.  9.4>8.8 . Had black BM this am. On iron will stop.   EGD showed 2 AVMS in jejunum with 2 clips.   Volume status stable.    INR 1.94   Possible d/c in am   I reviewed the LVAD parameters from today, and compared the results to the patient's prior recorded data.  No programming changes were made.  The LVAD is functioning within specified parameters.  The patient performs LVAD self-test daily.  LVAD interrogation was negative for any significant power changes, alarms or PI events/speed drops.  LVAD equipment check completed and is in good working order.  Back-up equipment present.   LVAD education done on emergency procedures and precautions and reviewed exit site care.  Length of Stay: 4  CLEGG,AMY NP-C  02/16/2015, 8:08 AM  VAD Team --- VAD ISSUES ONLY--- Pager [phone] (7am - 7am)  Advanced Heart Failure Team  Pager [phone] (M-F; 7a - 4p)  Please contact [facility] for night-coverage after hours (4p -7a ) and weekends on [email or web address]   Patient seen and examined with Darrick Grinder, NP. We discussed all aspects of the encounter. I agree with the assessment and plan as stated above.   Results of endo yesterday reviewed. Appreciate GI care. Hgb down slightly today. INR 1.9. VAD parameters stable. Will watch one more day. If hgb stable can go home in am.   Benay Spice 12:43 PM

## 2015-02-16 NOTE — Progress Notes (Signed)
Patient ambulated in the hallway 400 feet on RA. Patient tolerated well. Will continue to monitor

## 2015-02-16 NOTE — Progress Notes (Signed)
ANTICOAGULATION CONSULT NOTE - Follow Up Consult  Pharmacy Consult for Coumadin Indication: LVAD  Allergies  Allergen Reactions  . Demerol [Meperidine] Other (See Comments)    Paralysis. Could only move eyes.  . Meperidine Hcl Other (See Comments)    "CAN'T MOVE" CATATONIC PER PT.  [name] Opium Other (See Comments)    "CAN'T MOVE" CATATONIC PER PT.    Patient Measurements: Height: 6' (182.9 cm) Weight: 194 lb 0.1 oz (88 kg) IBW/kg (Calculated) : 77.6  Vital Signs: Temp: 98.5 F (36.9 C) (04/13 0453) Temp Source: Oral (04/13 0453) Pulse Rate: 68 (04/13 0453)  Labs:  Recent Labs  02/14/15 0426 02/15/15 0335 02/15/15 0935 02/16/15 0315  HGB 8.0*  --  9.4* 8.8*  HCT 24.5*  --  28.7* 27.2*  PLT 133*  --  149* 134*  LABPROT 29.1* 22.7*  --  22.3*  INR 2.73* 1.98*  --  1.94*  CREATININE 1.40*  --  1.33 1.38*    Estimated Creatinine Clearance: 50.8 mL/min (by C-G formula based on Cr of 1.38).  Assessment: 73yom on coumadin pta for LVAD (10/19/13) admitted 4/9 with suspected UGIB and Hgb 5.4. INR on admission was 2.75 and coumadin was placed on hold. He received a total of 5 units PRBCs. No reversal agents given. 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 Coumadin was resumed. INR today is slightly below goal at 1.94. CBC stable. No bleeding reported.  Home dose: 2mg  daily except 4mg  MWF  Goal of Therapy:  INR 2-3 Monitor platelets by anticoagulation protocol: Yes   Plan:  1) Repeat coumadin 4mg  x 1 2) Daily INR, CBC  Deboraha Sprang 02/16/2015,11:45 AM

## 2015-02-16 NOTE — Progress Notes (Signed)
Unassigned patient Subjective: Since I last evaluated the patient, he seems to be doing very well. He denies having any abdominal pain, nausea or hematochezia. He continues to have black stools as before as he has been on iron. Hemoglobin stable today. He has had jejunal AVM's ablated yesterday.  Objective: Vital signs in last 24 hours: Temp:  [98.5 F (36.9 C)-98.9 F (37.2 C)] 98.5 F (36.9 C) (04/13 0453) Pulse Rate:  [68-80] 68 (04/13 0453) Resp:  [11-16] 11 (04/13 0453) SpO2:  [92 %-96 %] 96 % (04/13 0453) Weight:  [88 kg (194 lb 0.1 oz)] 88 kg (194 lb 0.1 oz) (04/13 0453) Last BM Date: 02/15/15  Intake/Output from previous day: 04/12 0701 - 04/13 0700 In: 500 [I.V.:500] Out: 375 [Urine:375] Intake/Output this shift: Total I/O In: 240 [P.O.:240] Out: -   General appearance: alert, cooperative, appears stated age and no distress Resp: clear to auscultation bilaterally Cardio: mechanical hum heard GI: soft, non-tender; bowel sounds normal; no masses,  no organomegaly  Lab Results:  Recent Labs  02/14/15 0426 02/15/15 0935 02/16/15 0315  WBC 5.5 5.7 4.9  HGB 8.0* 9.4* 8.8*  HCT 24.5* 28.7* 27.2*  PLT 133* 149* 134*   BMET  Recent Labs  02/14/15 0426 02/15/15 0935 02/16/15 0315  NA 139 139 138  K 4.2 4.3 4.3  CL 107 107 105  CO2 30 25 24   GLUCOSE 107* 106* 99  BUN 30* 20 19  CREATININE 1.40* 1.33 1.38*  CALCIUM 8.0* 8.3* 8.0*   PT/INR  Recent Labs  02/15/15 0335 02/16/15 0315  LABPROT 22.7* 22.3*  INR 1.98* 1.94*    Medications: I have reviewed the patient's current medications.  Assessment/Plan: Anemia s/p ablation of jejunal AVM's; stable.  Continue present care.   LOS: 4 days   Rilla Buckman 02/16/2015, 5:32 PM

## 2015-02-16 NOTE — Progress Notes (Signed)
Utilization review completed.  

## 2015-02-16 NOTE — Progress Notes (Addendum)
Admitted 02/12/15 due to suspected GI Bleeding/anemia (HgB 5.4).   HeartMate II LVAD implated on 10/19/13 by Dr. Cyndia Bent.   Vital signs: Temp:  98.5 - 98.9 deg F HR: 68 - 80 APaced Doppler MAP: 88 - 96 O2 Sat: 92 - 96 % RA Wt in lbs:   198 > 192 > 194   LVAD interrogation reveals:  Speed: 9200 Flow: 3.8 Power: 4.9 PI:  5.3 Alarms: none Events: 4 PI events today Fixed speed: 9200 Low speed limit: 8600  Drive Line: Site being changed weekly by [name]. Last dressing change was Thursday of last week. States he will have [name] change it since likely discharge pending blood work will be tomorrow.    Labs:  LDH trend: 219 - 194 - 192 - 224 - 213  INR trend: 2.75 - 2.61 - 2.73 -1.98 - 1.94  Hgb trend: 5.5 - 6.9 - 8.8 - 8.0 - 9.4 - 8.8  Blood Products:  02/12/15--2 u PRBC 02/13/15--2 u PRBC 02/14/15--1 u PRBC  LD Coumadin:  02/12/15; Resumed with 4 mg 02/15/15  LD ASA:  02/12/15--> will not resume at this time  Mr. Heide is sitting up in bed this morning comfortably. Reports he ambulated 400 ft with rehab and tolerated it well. Dressing is C/D/I and will be changed by his significant other tomorrow. Reporting black stools at this time still--currently off iron since admit on 02/12/15.    Plan/Recommendations & D/C Needs Discussed w/ VAD Team:  1. 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 Benson Norway ablated. Some minor bleeding induced confirming it to be a vascular lesion.   2. Titration of coumadin for INR goal 2.0 - 2.5.   3. Ambulate with Cardiac Rehab team and prevent deconditioning.

## 2015-02-17 ENCOUNTER — Other Ambulatory Visit (HOSPITAL_COMMUNITY): Payer: Self-pay | Admitting: Infectious Diseases

## 2015-02-17 ENCOUNTER — Telehealth (HOSPITAL_COMMUNITY): Payer: Self-pay | Admitting: Infectious Diseases

## 2015-02-17 DIAGNOSIS — Z95811 Presence of heart assist device: Secondary | ICD-10-CM

## 2015-02-17 LAB — BASIC METABOLIC PANEL
Anion gap: 8 (ref 5–15)
BUN: 17 mg/dL (ref 6–23)
CHLORIDE: 104 mmol/L (ref 96–112)
CO2: 25 mmol/L (ref 19–32)
Calcium: 8.3 mg/dL — ABNORMAL LOW (ref 8.4–10.5)
Creatinine, Ser: 1.32 mg/dL (ref 0.50–1.35)
GFR calc Af Amer: 59 mL/min — ABNORMAL LOW (ref >90)
GFR calc non Af Amer: 51 mL/min — ABNORMAL LOW (ref >90)
GLUCOSE: 108 mg/dL — AB (ref 70–99)
Potassium: 4.7 mmol/L (ref 3.5–5.1)
Sodium: 137 mmol/L (ref 135–145)

## 2015-02-17 LAB — CBC
HCT: 28.6 % — ABNORMAL LOW (ref 39.0–52.0)
Hemoglobin: 9.2 g/dL — ABNORMAL LOW (ref 13.0–17.0)
MCH: 30.3 pg (ref 26.0–34.0)
MCHC: 32.2 g/dL (ref 30.0–36.0)
MCV: 94.1 fL (ref 78.0–100.0)
PLATELETS: 140 10*3/uL — AB (ref 150–400)
RBC: 3.04 MIL/uL — ABNORMAL LOW (ref 4.22–5.81)
RDW: 21.4 % — AB (ref 11.5–15.5)
WBC: 4.4 10*3/uL (ref 4.0–10.5)

## 2015-02-17 LAB — LACTATE DEHYDROGENASE: LDH: 217 U/L (ref 94–250)

## 2015-02-17 LAB — PROTIME-INR
INR: 1.89 — AB (ref 0.00–1.49)
Prothrombin Time: 21.9 seconds — ABNORMAL HIGH (ref 11.6–15.2)

## 2015-02-17 MED ORDER — WARFARIN SODIUM 2 MG PO TABS: ORAL_TABLET | ORAL | Status: DC

## 2015-02-17 MED ORDER — PANTOPRAZOLE SODIUM 40 MG PO TBEC: 40.0000 mg | DELAYED_RELEASE_TABLET | Freq: Two times a day (BID) | ORAL | Status: DC

## 2015-02-17 NOTE — Progress Notes (Signed)
Patient ID: [name], male   DOB: 04-16-1941, 74 y.o.   MRN: [id number] HeartMate 2 Rounding Note  Subjective:    Admitted with suspected upper GI bleeding (dark stool but no change from prior given Fe use).  Profoundly weak and hemoglobin 5.4 at admission  Has gotten 5u RBCs. Had ECG with 2 AVMS in jejunum clipped. Hgb ok. 8.8>9.2. 1 BM black yesterday.    Denies SOB    LVAD INTERROGATION:  HeartMate II LVAD:  Flow 4.5 liters/min, speed 9200, power 5 PI 7.4  Objective:    Vital Signs:   Temp:  [98.1 F (36.7 C)-98.9 F (37.2 C)] 98.1 F (36.7 C) (04/14 0435) Pulse Rate:  [69-71] 71 (04/14 0435) Resp:  [17-18] 18 (04/14 0435) SpO2:  [92 %-96 %] 92 % (04/14 0435) Weight:  [193 lb 3.2 oz (87.635 kg)] 193 lb 3.2 oz (87.635 kg) (04/14 0435) Last BM Date: 02/16/15 Mean arterial Pressure 86   Intake/Output:   Intake/Output Summary (Last 24 hours) at 02/17/15 0754 Last data filed at 02/16/15 0818  Gross per 24 hour  Intake    240 ml  Output      0 ml  Net    240 ml     Physical Exam: General:  Well appearing. No resp difficulty HEENT: normal Neck: supple. JVP 5-6. Carotids 2+ bilat; no bruits. No lymphadenopathy or thryomegaly appreciated. Cor: Mechanical heart sounds with LVAD hum present. Lungs: clear Abdomen: soft, nontender, nondistended. No hepatosplenomegaly. No bruits or masses. Good bowel sounds. Driveline: C/D/I; securement device intact and driveline incorporated Extremities: no cyanosis, clubbing, rash, edema Neuro: alert & orientedx3, cranial nerves grossly intact. moves all 4 extremities w/o difficulty. Affect pleasant  Telemetry: a-paced, v-sensed 70s  Labs: Basic Metabolic Panel:  Recent Labs Lab 02/13/15 0545 02/14/15 0426 02/15/15 0935 02/16/15 0315 02/17/15 0428  NA 139 139 139 138 137  K 4.0 4.2 4.3 4.3 4.7  CL 106 107 107 105 104  CO2 28 30 25 24 25   GLUCOSE 107* 107* 106* 99 108*  BUN 33* 30* 20 19 17   CREATININE 1.50* 1.40* 1.33  1.38* 1.32  CALCIUM 7.8* 8.0* 8.3* 8.0* 8.3*    Liver Function Tests:  Recent Labs Lab 02/12/15 2215  AST 25  ALT 18  ALKPHOS 70  BILITOT 0.4  PROT 5.1*  ALBUMIN 3.0*   No results for input(s): LIPASE, AMYLASE in the last 168 hours. No results for input(s): AMMONIA in the last 168 hours.  CBC:  Recent Labs Lab 02/12/15 2215  02/13/15 1931 02/14/15 0426 02/15/15 0935 02/16/15 0315 02/17/15 0428  WBC 7.1  < > 8.0 5.5 5.7 4.9 4.4  NEUTROABS 4.8  --   --   --   --   --   --   HGB 5.5*  < > 8.8* 8.0* 9.4* 8.8* 9.2*  HCT 16.7*  < > 26.4* 24.5* 28.7* 27.2* 28.6*  MCV 102.5*  < > 93.0 92.8 93.2 95.1 94.1  PLT 169  < > 144* 133* 149* 134* 140*  < > = values in this interval not displayed.  INR:  Recent Labs Lab 02/13/15 0545 02/14/15 0426 02/15/15 0335 02/16/15 0315 02/17/15 0428  INR 2.61* 2.73* 1.98* 1.94* 1.89*    Other results:    Imaging: No results found.   Medications:     Scheduled Medications: . sodium chloride  10 mL/hr Intravenous Once  . sodium chloride   Intravenous Once  . amiodarone  200 mg Oral BID  .  amLODipine  10 mg Oral QHS  . atorvastatin  80 mg Oral Daily  . docusate sodium  200 mg Oral Daily  . levothyroxine  175 mcg Oral QAC breakfast  . magnesium gluconate  1,000 mg Oral Daily  . pantoprazole (PROTONIX) IV  40 mg Intravenous Q12H  . ranolazine  500 mg Oral BID  . sodium chloride  3 mL Intravenous Q12H  . Warfarin - Pharmacist Dosing Inpatient   Does not apply q1800    Infusions:    PRN Medications: sodium chloride, nitroGLYCERIN, promethazine, sodium chloride   Assessment:  1. 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 CAD 3. Paroxysmal atrial fibrillation 4. Nephrolithiasis 5. GI bleeding: FOBT + with symptomatic anemia  Plan/Discussion:    Overall he has received 5UPRBCs.  9.4>8.8>9.2 . NO BMs today. Keep off aspirin.    EGD showed 2 AVMS in jejunum with 2 clips.    Volume status stable.    INR 1.89 . Goal for INR 2.0-2.5   D/C today.   I reviewed the LVAD parameters from today, and compared the results to the patient's prior recorded data.  No programming changes were made.  The LVAD is functioning within specified parameters.  The patient performs LVAD self-test daily.  LVAD interrogation was negative for any significant power changes, alarms or PI events/speed drops.  LVAD equipment check completed and is in good working order.  Back-up equipment present.   LVAD education done on emergency procedures and precautions and reviewed exit site care.  Length of Stay: 5  CLEGG,AMY NP-C  02/17/2015, 7:54 AM  VAD Team --- VAD ISSUES ONLY--- Pager [phone] (7am - 7am)   Patient seen and examined with Darrick Grinder, NP. We discussed all aspects of the encounter. I agree with the assessment and plan as stated above.   HGb stable. No evidence further bleeding. Can go home today. Stop ASA. Goal INR 2.0-2.5. Volume status fine. VAD parameters stable.   F/u next week in HF Clinic. Will hold hydralazine for now can restart next week as needed.   Tyjai Charbonnet,MD 11:11 AM

## 2015-02-17 NOTE — Discharge Summary (Signed)
Advanced Heart Failure Team  Discharge Summary   Patient ID: [name] MRN: [id number], DOB/AGE: 76-Nov-1942 76 y.o. Admit date: 02/12/2015 D/C date:     02/17/2015   Primary Diagnoses 1. Acute Blood Loss -- Hgb 5.4  GI Bleed --S/P EGD with 2 AVMs clipped in jejumum .    No aspirin 2.. 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 CAD 3. Paroxysmal atrial fibrillation 4. H/O 09/2015 Nephrolithiasis with J stent 5. Chronic Anticoagulation - on coumadin - Goal for INR 2-2.5   Hospital Course:  [name] is a 74 yo male with h/o VT, PAD and severe systolic HF (EF 59-93%) due to mixed ischemic/nonischemic cardiomyopathy. He is s/p HM II LVAD implant for destination therapy on October 19, 2013. On 09/01/14 underwent cystoscopy and flexible ureteroscopy with lithotripsy and basket extraction. Admitted 09/2014 hematuria and chronic nephrolithiasis. Underwent Echo to assess RV for concern RV failure because he presented with SOB. RV looked good and had renal US showed mild hyronephrosis and bilateral renal cysts. Dr. Risa Grill took back for repeat lithotripsy and stone extraction by ureteroscopy and J stent placement.   Admitted with weakness and fall. In ER hemoglobin was found to be 5.4 with MAP 78. Prior to admit had been on iron and was having black stool. While hospitalized he received 5UPRBCs. 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 He was observed and his hemoglobin remained stable.  Hemoglobin on discharge was 9.2.   From HF/LVAD  standpoint he remained stable. He will remain off aspirin and iron. INR goal has been adjusted to 2.0-2.5. He will continue to be followed closely in the LVAD clinic and has follow up next week. Plan to check CBC, CMET, LDH, and INR at that time.    Discharge Weight Range: 193 pounds.  Discharge Vitals: Blood pressure 0/0, pulse 71,  temperature 98.1 F (36.7 C), temperature source Oral, resp. rate 18, height 6' (1.829 m), weight 193 lb 3.2 oz (87.635 kg), SpO2 92 %.  Labs: Lab Results  Component Value Date   WBC 4.4 02/17/2015   HGB 9.2* 02/17/2015   HCT 28.6* 02/17/2015   MCV 94.1 02/17/2015   PLT 140* 02/17/2015    Recent Labs Lab 02/12/15 2215  02/17/15 0428  NA 137  < > 137  K 4.5  < > 4.7  CL 106  < > 104  CO2 24  < > 25  BUN 38*  < > 17  CREATININE 1.71*  < > 1.32  CALCIUM 8.4  < > 8.3*  PROT 5.1*  --   --   BILITOT 0.4  --   --   ALKPHOS 70  --   --   ALT 18  --   --   AST 25  --   --   GLUCOSE 127*  < > 108*  < > = values in this interval not displayed. Lab Results  Component Value Date   CHOL 118 11/28/2012   HDL 50.80 11/28/2012   LDLCALC 58 11/28/2012   TRIG 48.0 11/28/2012   BNP (last 3 results)  Recent Labs  11/25/14 1335  BNP 420.8*    ProBNP (last 3 results)  Recent Labs  09/09/14 1038 09/13/14 1024 10/14/14 0936  PROBNP 5067.0* 5152.0* 1880.0*     Diagnostic Studies/Procedures   No results found.  Discharge Medications  Medication List    STOP taking these medications        amoxicillin-clavulanate 875-125 MG per tablet  Commonly known as:  AUGMENTIN     aspirin 81 MG tablet     enoxaparin 40 MG/0.4ML injection  Commonly known as:  LOVENOX     ferrous gluconate 324 MG tablet  Commonly known as:  FERGON     hydrALAZINE 25 MG tablet  Commonly known as:  APRESOLINE      TAKE these medications        amiodarone 200 MG tablet  Commonly known as:  PACERONE  Take 1 tablet (200 mg total) by mouth 2 (two) times daily.     amLODipine 10 MG tablet  Commonly known as:  NORVASC  Take 10 mg by mouth every evening.     atorvastatin 80 MG tablet  Commonly known as:  LIPITOR  Take 80 mg by mouth every morning.     docusate sodium 100 MG capsule  Commonly known as:  COLACE  Take 200 mg by mouth every morning.     furosemide 20 MG tablet   Commonly known as:  LASIX  Take 20 mg by mouth daily as needed for fluid.     ICAPS PO  Take 1 tablet by mouth 2 (two) times daily.     levothyroxine 175 MCG tablet  Commonly known as:  SYNTHROID, LEVOTHROID  Take 1 tablet (175 mcg total) by mouth daily before breakfast.     magnesium gluconate 500 MG tablet  Commonly known as:  MAGONATE  Take 1,000 mg by mouth daily.     multivitamin tablet  Take 1 tablet by mouth at bedtime.     nitroGLYCERIN 0.4 MG SL tablet  Commonly known as:  NITROSTAT  Place 0.4 mg under the tongue every 5 (five) minutes as needed for chest pain.     pantoprazole 40 MG tablet  Commonly known as:  PROTONIX  Take 1 tablet (40 mg total) by mouth 2 (two) times daily.     promethazine 12.5 MG tablet  Commonly known as:  PHENERGAN  Take 1 tablet (12.5 mg total) by mouth every 6 (six) hours as needed for nausea or vomiting.     ranolazine 500 MG 12 hr tablet  Commonly known as:  RANEXA  Take 500 mg by mouth 2 (two) times daily.     RANEXA 500 MG 12 hr tablet  Generic drug:  ranolazine  TAKE 1 TABLET (500 MG TOTAL) BY MOUTH 2 (TWO) TIMES DAILY.     warfarin 2 MG tablet  Commonly known as:  COUMADIN  Takes 6 mg on 02/17/15  then resume 2mg  daily, except takes 4mg  on Monday, Wednesday and  Friday.     zinc gluconate 50 MG tablet  Take 100 mg by mouth daily.        Disposition   The patient will be discharged in stable condition to home.     Discharge Instructions    Contraindication to ACEI at discharge    Complete by:  As directed      Diet - low sodium heart healthy    Complete by:  As directed      Heart Failure patients record your daily weight using the same scale at the same time of day    Complete by:  As directed      Increase activity slowly    Complete by:  As directed  Follow-up Information    Follow up with Glori Bickers, MD On 02/24/2015.   Specialty:  Cardiology   Why:  at 1000 LVAD clinic   Contact  information:   [place] [address] [phone]         Duration of Discharge Encounter: Greater than 35 minutes   Signed, CLEGG,AMY NP-C  02/17/2015, 11:22 AM  Patient seen and examined with Darrick Grinder, NP. We discussed all aspects of the encounter. I agree with the assessment and plan as stated above. [place] for d/c, Hgb stable. Off ASA. INR goal 2.0-2.5  Benay Spice 6:31 PM

## 2015-02-17 NOTE — Care Management Note (Signed)
    Page 1 of 1   02/17/2015     11:15:40 AM CARE MANAGEMENT NOTE 02/17/2015  Patient:  [name], [name]   Account Number:  [id number]  Date Initiated:  02/15/2015  Documentation initiated by:  Marvetta Gibbons  Subjective/Objective Assessment:   Pt admitted with anemia ?GIB     Action/Plan:   PTA pt lived at home- hx LVAD   Anticipated DC Date:  02/16/2015   Anticipated DC Plan:  HOME/SELF CARE         Choice offered to / List presented to:             Status of service:  Completed, signed off Medicare Important Message given?  YES (If response is "NO", the following Medicare IM given date fields will be blank) Date Medicare IM given:  02/15/2015 Medicare IM given by:  Marvetta Gibbons Date Additional Medicare IM given:   Additional Medicare IM given by:    Discharge Disposition:  HOME/SELF CARE  Per UR Regulation:  Reviewed for med. necessity/level of care/duration of stay  If discussed at [place] of Stay Meetings, dates discussed:   02/17/2015    Comments:

## 2015-02-17 NOTE — Telephone Encounter (Signed)
Rescheduled VAD appointment to Thursday 02/24/15 @ 1000 with RAMP echo following @ 1100. Will reschedule annual PFTs at another appointment incase we need to admit to medical day for blood transfusion.   [name] had questions regarding medications since D/C. After reviewing discharge and discussing with Darrick Grinder, NP I instructed [name] to:  1. Hold iron for now so we can see if he starts bleeding again with dark BMs  2. Hold Hydralazine and Amlodipine (MAP at D/C after not having these meds was 78). Will reassess at next clinic visit.   3. Rejection from Medicare for prescription for BID Protonix. I will take care of this and resubmit prior auth.

## 2015-02-20 ENCOUNTER — Emergency Department (HOSPITAL_COMMUNITY): Payer: Medicare Other

## 2015-02-20 ENCOUNTER — Telehealth (HOSPITAL_COMMUNITY): Payer: Self-pay | Admitting: Infectious Diseases

## 2015-02-20 ENCOUNTER — Observation Stay (HOSPITAL_COMMUNITY)
Admission: EM | Admit: 2015-02-20 | Discharge: 2015-02-22 | Disposition: A | Discharge status: Home / Self Care | Payer: Medicare Other | Attending: Internal Medicine | Admitting: Internal Medicine

## 2015-02-20 ENCOUNTER — Encounter (HOSPITAL_COMMUNITY): Payer: Self-pay | Admitting: Emergency Medicine

## 2015-02-20 DIAGNOSIS — E785 Hyperlipidemia, unspecified: Secondary | ICD-10-CM | POA: Insufficient documentation

## 2015-02-20 DIAGNOSIS — Z87891 Personal history of nicotine dependence: Secondary | ICD-10-CM | POA: Insufficient documentation

## 2015-02-20 DIAGNOSIS — W19XXXA Unspecified fall, initial encounter: Secondary | ICD-10-CM | POA: Diagnosis not present

## 2015-02-20 DIAGNOSIS — M545 Low back pain, unspecified: Secondary | ICD-10-CM

## 2015-02-20 DIAGNOSIS — I480 Paroxysmal atrial fibrillation: Secondary | ICD-10-CM | POA: Insufficient documentation

## 2015-02-20 DIAGNOSIS — E039 Hypothyroidism, unspecified: Secondary | ICD-10-CM | POA: Insufficient documentation

## 2015-02-20 DIAGNOSIS — S32040A Wedge compression fracture of fourth lumbar vertebra, initial encounter for closed fracture: Secondary | ICD-10-CM

## 2015-02-20 DIAGNOSIS — I2510 Atherosclerotic heart disease of native coronary artery without angina pectoris: Secondary | ICD-10-CM | POA: Insufficient documentation

## 2015-02-20 DIAGNOSIS — Z7982 Long term (current) use of aspirin: Secondary | ICD-10-CM | POA: Insufficient documentation

## 2015-02-20 DIAGNOSIS — I429 Cardiomyopathy, unspecified: Secondary | ICD-10-CM | POA: Insufficient documentation

## 2015-02-20 DIAGNOSIS — I5022 Chronic systolic (congestive) heart failure: Secondary | ICD-10-CM | POA: Diagnosis not present

## 2015-02-20 DIAGNOSIS — Z79899 Other long term (current) drug therapy: Secondary | ICD-10-CM | POA: Diagnosis not present

## 2015-02-20 DIAGNOSIS — Z955 Presence of coronary angioplasty implant and graft: Secondary | ICD-10-CM | POA: Diagnosis not present

## 2015-02-20 DIAGNOSIS — M4856XA Collapsed vertebra, not elsewhere classified, lumbar region, initial encounter for fracture: Secondary | ICD-10-CM | POA: Diagnosis not present

## 2015-02-20 DIAGNOSIS — I252 Old myocardial infarction: Secondary | ICD-10-CM | POA: Diagnosis not present

## 2015-02-20 DIAGNOSIS — Z95811 Presence of heart assist device: Secondary | ICD-10-CM | POA: Diagnosis not present

## 2015-02-20 DIAGNOSIS — Z7901 Long term (current) use of anticoagulants: Secondary | ICD-10-CM | POA: Diagnosis not present

## 2015-02-20 DIAGNOSIS — I5023 Acute on chronic systolic (congestive) heart failure: Secondary | ICD-10-CM

## 2015-02-20 DIAGNOSIS — M8448XA Pathological fracture, other site, initial encounter for fracture: Secondary | ICD-10-CM | POA: Diagnosis present

## 2015-02-20 LAB — CBC WITH DIFFERENTIAL/PLATELET
Basophils Absolute: 0 10*3/uL (ref 0.0–0.1)
Basophils Relative: 1 % (ref 0–1)
Eosinophils Absolute: 0.2 10*3/uL (ref 0.0–0.7)
Eosinophils Relative: 4 % (ref 0–5)
HEMATOCRIT: 30.9 % — AB (ref 39.0–52.0)
Hemoglobin: 9.9 g/dL — ABNORMAL LOW (ref 13.0–17.0)
Lymphocytes Relative: 15 % (ref 12–46)
Lymphs Abs: 0.9 10*3/uL (ref 0.7–4.0)
MCH: 30.1 pg (ref 26.0–34.0)
MCHC: 32 g/dL (ref 30.0–36.0)
MCV: 93.9 fL (ref 78.0–100.0)
MONO ABS: 1.2 10*3/uL — AB (ref 0.1–1.0)
Monocytes Relative: 20 % — ABNORMAL HIGH (ref 3–12)
NEUTROS ABS: 3.4 10*3/uL (ref 1.7–7.7)
Neutrophils Relative %: 60 % (ref 43–77)
Platelets: 207 10*3/uL (ref 150–400)
RBC: 3.29 MIL/uL — ABNORMAL LOW (ref 4.22–5.81)
RDW: 20.4 % — ABNORMAL HIGH (ref 11.5–15.5)
WBC: 5.7 10*3/uL (ref 4.0–10.5)

## 2015-02-20 LAB — BASIC METABOLIC PANEL
Anion gap: 7 (ref 5–15)
BUN: 18 mg/dL (ref 6–23)
CHLORIDE: 103 mmol/L (ref 96–112)
CO2: 24 mmol/L (ref 19–32)
CREATININE: 1.43 mg/dL — AB (ref 0.50–1.35)
Calcium: 8.2 mg/dL — ABNORMAL LOW (ref 8.4–10.5)
GFR calc Af Amer: 54 mL/min — ABNORMAL LOW (ref >90)
GFR calc non Af Amer: 46 mL/min — ABNORMAL LOW (ref >90)
Glucose, Bld: 115 mg/dL — ABNORMAL HIGH (ref 70–99)
Potassium: 4 mmol/L (ref 3.5–5.1)
SODIUM: 134 mmol/L — AB (ref 135–145)

## 2015-02-20 LAB — PROTIME-INR
INR: 2.84 — AB (ref 0.00–1.49)
Prothrombin Time: 30 seconds — ABNORMAL HIGH (ref 11.6–15.2)

## 2015-02-20 LAB — LACTATE DEHYDROGENASE: LDH: 261 U/L — ABNORMAL HIGH (ref 94–250)

## 2015-02-20 MED ORDER — AMLODIPINE BESYLATE 10 MG PO TABS
10.0000 mg | ORAL_TABLET | Freq: Every evening | ORAL | Status: DC
  Administered 2015-02-21 – 2015-02-22 (×2): 10 mg via ORAL
  Filled 2015-02-20 (×2): qty 1

## 2015-02-20 MED ORDER — ACETAMINOPHEN 650 MG RE SUPP: 650.0000 mg | RECTAL | Status: DC | PRN

## 2015-02-20 MED ORDER — DOCUSATE SODIUM 100 MG PO CAPS
200.0000 mg | ORAL_CAPSULE | ORAL | Status: DC
  Administered 2015-02-21 – 2015-02-22 (×2): 200 mg via ORAL
  Filled 2015-02-20 (×3): qty 2

## 2015-02-20 MED ORDER — WARFARIN - PHARMACIST DOSING INPATIENT
Freq: Every day | Status: DC
  Administered 2015-02-21 – 2015-02-22 (×2)

## 2015-02-20 MED ORDER — MORPHINE SULFATE 4 MG/ML IJ SOLN
4.0000 mg | Freq: Once | INTRAMUSCULAR | Status: AC
  Administered 2015-02-20: 4 mg via INTRAVENOUS
  Filled 2015-02-20: qty 1

## 2015-02-20 MED ORDER — PROMETHAZINE HCL 12.5 MG PO TABS
12.5000 mg | ORAL_TABLET | Freq: Four times a day (QID) | ORAL | Status: DC | PRN
  Administered 2015-02-22: 12.5 mg via ORAL
  Filled 2015-02-20 (×3): qty 1

## 2015-02-20 MED ORDER — FUROSEMIDE 20 MG PO TABS
20.0000 mg | ORAL_TABLET | Freq: Every day | ORAL | Status: DC | PRN
  Filled 2015-02-20: qty 1

## 2015-02-20 MED ORDER — RANOLAZINE ER 500 MG PO TB12: 500.0000 mg | ORAL_TABLET | Freq: Two times a day (BID) | ORAL | Status: DC

## 2015-02-20 MED ORDER — MORPHINE SULFATE 4 MG/ML IJ SOLN
4.0000 mg | Freq: Once | INTRAMUSCULAR | Status: AC
Start: 2015-02-20 — End: 2015-02-20
  Administered 2015-02-20: 4 mg via INTRAVENOUS
  Filled 2015-02-20: qty 1

## 2015-02-20 MED ORDER — NITROGLYCERIN 0.4 MG SL SUBL: 0.4000 mg | SUBLINGUAL_TABLET | SUBLINGUAL | Status: DC | PRN

## 2015-02-20 MED ORDER — ATORVASTATIN CALCIUM 80 MG PO TABS
80.0000 mg | ORAL_TABLET | ORAL | Status: DC
  Administered 2015-02-21 – 2015-02-22 (×2): 80 mg via ORAL
  Filled 2015-02-20 (×4): qty 1

## 2015-02-20 MED ORDER — TRAMADOL HCL 50 MG PO TABS
50.0000 mg | ORAL_TABLET | Freq: Four times a day (QID) | ORAL | Status: DC | PRN
  Administered 2015-02-20 – 2015-02-21 (×3): 100 mg via ORAL
  Filled 2015-02-20 (×3): qty 2

## 2015-02-20 MED ORDER — AMIODARONE HCL 200 MG PO TABS
200.0000 mg | ORAL_TABLET | Freq: Two times a day (BID) | ORAL | Status: DC
  Administered 2015-02-20 – 2015-02-22 (×4): 200 mg via ORAL
  Filled 2015-02-20 (×5): qty 1

## 2015-02-20 MED ORDER — RANOLAZINE ER 500 MG PO TB12
500.0000 mg | ORAL_TABLET | Freq: Two times a day (BID) | ORAL | Status: DC
  Administered 2015-02-20 – 2015-02-22 (×4): 500 mg via ORAL
  Filled 2015-02-20 (×5): qty 1

## 2015-02-20 MED ORDER — LEVOTHYROXINE SODIUM 175 MCG PO TABS
175.0000 ug | ORAL_TABLET | Freq: Every day | ORAL | Status: DC
  Administered 2015-02-21 – 2015-02-22 (×2): 175 ug via ORAL
  Filled 2015-02-20 (×3): qty 1

## 2015-02-20 MED ORDER — MAGNESIUM GLUCONATE 500 MG PO TABS
1000.0000 mg | ORAL_TABLET | Freq: Every day | ORAL | Status: DC
  Administered 2015-02-21 – 2015-02-22 (×2): 1000 mg via ORAL
  Filled 2015-02-20 (×2): qty 2

## 2015-02-20 MED ORDER — ONDANSETRON HCL 4 MG/2ML IJ SOLN
4.0000 mg | Freq: Once | INTRAMUSCULAR | Status: AC
  Administered 2015-02-20: 4 mg via INTRAVENOUS
  Filled 2015-02-20: qty 2

## 2015-02-20 MED ORDER — ACETAMINOPHEN 325 MG PO TABS: 650.0000 mg | ORAL_TABLET | ORAL | Status: DC | PRN

## 2015-02-20 MED ORDER — OXYCODONE-ACETAMINOPHEN 5-325 MG PO TABS: 1.0000 | ORAL_TABLET | ORAL | Status: DC | PRN

## 2015-02-20 MED ORDER — OCUVITE-LUTEIN PO CAPS
ORAL_CAPSULE | Freq: Every day | ORAL | Status: DC
  Administered 2015-02-21 – 2015-02-22 (×2): 1 via ORAL
  Filled 2015-02-20 (×3): qty 1

## 2015-02-20 MED ORDER — WARFARIN SODIUM 1 MG PO TABS
1.0000 mg | ORAL_TABLET | Freq: Once | ORAL | Status: AC
  Administered 2015-02-20: 1 mg via ORAL
  Filled 2015-02-20: qty 1

## 2015-02-20 MED ORDER — PANTOPRAZOLE SODIUM 40 MG PO TBEC
40.0000 mg | DELAYED_RELEASE_TABLET | Freq: Two times a day (BID) | ORAL | Status: DC
  Administered 2015-02-20 – 2015-02-22 (×4): 40 mg via ORAL
  Filled 2015-02-20 (×4): qty 1

## 2015-02-20 NOTE — ED Notes (Signed)
Spoke with LVAD coordinator who states EDP to see pt due to pt not having LVAD issue and call Dr Tempie Hoist if needed.

## 2015-02-20 NOTE — ED Notes (Signed)
Returned from x-ray with Nurse and Rapid Response Nurse patient tolerated x-ray however states pain exactly the same.

## 2015-02-20 NOTE — ED Notes (Addendum)
LVAD pt>C/o lower back pain since being discharged from hospital on Thursday.  States pain has gradually gotten worse.  Denies urinary complaint.  No known injury.  NAD.  Pt taken straight to treatment room.

## 2015-02-20 NOTE — ED Provider Notes (Cosign Needed)
CSN: [id number]     Arrival date & time 02/20/15  1712 History   First MD Initiated Contact with Patient 02/20/15 1728     Chief Complaint  Patient presents with  . Back Pain    HPI Pt started having pain in the lower back 2 days ago shortly after being released from the hospital.  He was admitted for a gi bleed.  He had several blood transfusions.  He was not having any back pain while in the hospital.  No falls.  No fever or vomiting.  No abdominal pain.  The pain is sharp.  Constant in the lower back.  No radiation.  Movement increases the pain.  Sitting straight up, bending or walking increases the pain.  He tried taking tylenol without relief. Past Medical History  Diagnosis Date  . Bradycardia   . Ventricular tachycardia     s/p AICD;   h/o ICD shock;  Amiodarone Rx. S/P ablation in 2012  . 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 HTN (hypertension)   . HLD (hyperlipidemia)   . Cytopenia   . Hypothyroidism   . Renal insufficiency   . Claudication   . Chronic systolic heart failure   . Ischemic cardiomyopathy     echo 7/12:  EF 25-30%, mild AI, mod MR, mod LAE  . TIA (transient ischemic attack) 2001  . CVD (cerebrovascular disease)     left CEA 2008  . Stroke   . Anemia   . Inappropriate therapy from implantable cardioverter-defibrillator 04/02/2013    ATP for sinus tach  SVT wavelet identified SVT but was no  passive   . Myocardial infarction 1992  . Anginal pain   . Automatic implantable cardioverter-defibrillator in situ     Medtronic Evera device serial number [id number]    . Arthritis     "a little in my knees" (05/25/2013)  . Melanoma of ear 2013    "near left ear" (05/25/2013)  . 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 Dysrhythmia     HX A FIB  . History of nonmelanoma skin cancer     L EAR  . Kidney stone   . History of  transfusion   . Sleep apnea     denies wearing mask (05/25/2013)  . CAD (coronary artery disease)     s/p MI 1982;  s/p DES to CFX 2011;  s/p NSTEMI 4/12 in setting of VTach;  cath 7/12:  LM ok, LAD mid 30-40%, Dx's with 40%, mCFX stent patent, prox to mid RCA occluded with R to R and L to R collats.  Medical Tx was continued  . CHF (congestive heart failure)     HAS VENTRICULAR PUMP   Past Surgical History  Procedure Laterality Date  . Femoral-popliteal bypass graft Right 2011  . Cardiac defibrillator placement  2003; 2005; 05/25/2013    2014: Medtronic Evera device serial number [id number]  . Revascularization / in-situ graft leg    . Renal artery bypass Bilateral 2003  . Back surgery    . Lumbar disc surgery  1974; 2000's  . Knee arthroscopy Bilateral 1990's  . Elbow arthroscopy Right 1990's  . Cholecystectomy  12/15/?2010  . Lipoma excision Left 2013    "near ear" (05/25/2013)  . Heel spur surgery Right 1990's  .  Cataract extraction w/ intraocular lens  implant, bilateral Bilateral 2000  . Carotid endarterectomy Left ~ 2007  . Doppler echocardiography  2011  . Tonsillectomy  1946  . Femoral-popliteal bypass graft Left 05/2002    Archie Endo 05/06/2002 (05/25/2013)  . Tumor excision Left 1960's    "fatty tumor" (05/25/2013)  . Cardiac electrophysiology study and ablation  2012  . Coronary angioplasty  1992  . Cardiac catheterization      "several" (05/25/2013)  . Coronary angioplasty with stent placement      "last one was 11/2012  (05/25/2013)  . Cardioversion N/A 10/15/2013    Procedure: CARDIOVERSION;  Surgeon: Jolaine Artist, MD;  Location: [place];  Service: Cardiovascular;  Laterality: N/A;  . Insertion of implantable left ventricular assist device N/A 10/19/2013    Procedure: INSERTION OF IMPLANTABLE LEFT VENTRICULAR ASSIST DEVICE;  Surgeon: Gaye Pollack, MD;  Location: [place];  Service: Open Heart Surgery;  Laterality: N/A;  CIRC ARREST  NITRIC OXIDE  MEDTRONIC ICD  .  Intraoperative transesophageal echocardiogram N/A 10/19/2013    Procedure: INTRAOPERATIVE TRANSESOPHAGEAL ECHOCARDIOGRAM;  Surgeon: Gaye Pollack, MD;  Location: [facility] OR;  Service: Open Heart Surgery;  Laterality: N/A;  . Tee without cardioversion N/A 11/04/2013    Procedure: TRANSESOPHAGEAL ECHOCARDIOGRAM (TEE);  Surgeon: Larey Dresser, MD;  Location: [place];  Service: Cardiovascular;  Laterality: N/A;  . Cardioversion N/A 11/04/2013    Procedure: CARDIOVERSION;  Surgeon: Larey Dresser, MD;  Location: [place];  Service: Cardiovascular;  Laterality: N/A;  . Cardioversion N/A 06/04/2014    Procedure: CARDIOVERSION;  Surgeon: Jolaine Artist, MD;  Location: [facility] ENDOSCOPY;  Service: Cardiovascular;  Laterality: N/A;  . Cystoscopy with retrograde pyelogram, ureteroscopy and stent placement Left 08/09/2014    Procedure: [place], URETEROSCOPY AND STENT PLACEMENT;  Surgeon: Bernestine Amass, MD;  Location: [facility] ORS;  Service: Urology;  Laterality: Left;  . Cystoscopy with retrograde pyelogram, ureteroscopy and stent placement Left 09/01/2014    Procedure: CYSTOSCOPY WITH URETEROSCOPY AND STENT REMOVAL;  Surgeon: Bernestine Amass, MD;  Location: [facility];  Service: Urology;  Laterality: Left;  . Holmium laser application Left 60/15/6153    Procedure: HOLMIUM LASER APPLICATION;  Surgeon: Bernestine Amass, MD;  Location: [facility];  Service: Urology;  Laterality: Left;  . Cystoscopy with retrograde pyelogram, ureteroscopy and stent placement Left 09/20/2014    Procedure: CYSTOSCOPY WITH RETROGRADE PYELOGRAM, URETEROSCOPY AND STENT PLACEMENT, stone removal;  Surgeon: Bernestine Amass, MD;  Location: [facility];  Service: Urology;  Laterality: Left;  Stephanie Coup ablation N/A 09/12/2011    Procedure: V-TACH ABLATION;  Surgeon: Evans Lance, MD;  Location: [facility] CATH LAB;  Service: Cardiovascular;  Laterality: N/A;  . Left heart catheterization with coronary angiogram N/A 11/24/2012     Procedure: LEFT HEART CATHETERIZATION WITH CORONARY ANGIOGRAM;  Surgeon: Peter M Martinique, MD;  Location: [facility] CATH LAB;  Service: Cardiovascular;  Laterality: N/A;  . Implantable cardioverter defibrillator generator change N/A 05/25/2013    Procedure: IMPLANTABLE CARDIOVERTER DEFIBRILLATOR GENERATOR CHANGE;  Surgeon: Deboraha Sprang, MD;  Location: [facility] CATH LAB;  Service: Cardiovascular;  Laterality: N/A;  . Lead revision N/A 06/05/2013    Procedure: LEAD REVISION;  Surgeon: Thompson Grayer, MD;  Location: [facility] CATH LAB;  Service: Cardiovascular;  Laterality: N/A;  . V-tach ablation N/A 06/08/2013    Procedure: V-TACH ABLATION;  Surgeon: Evans Lance, MD;  Location: [facility] CATH LAB;  Service: Cardiovascular;  Laterality: N/A;  . Right heart  catheterization N/A 09/17/2013    Procedure: RIGHT HEART CATH;  Surgeon: Jolaine Artist, MD;  Location: [facility] CATH LAB;  Service: Cardiovascular;  Laterality: N/A;  . Right heart catheterization N/A 10/14/2013    Procedure: RIGHT HEART CATH;  Surgeon: Jolaine Artist, MD;  Location: [facility] CATH LAB;  Service: Cardiovascular;  Laterality: N/A;  . Right heart catheterization N/A 11/16/2013    Procedure: RIGHT HEART CATH;  Surgeon: Jolaine Artist, MD;  Location: [facility] CATH LAB;  Service: Cardiovascular;  Laterality: N/A;  . Right heart catheterization N/A 07/13/2014    Procedure: RIGHT HEART CATH;  Surgeon: Jolaine Artist, MD;  Location: [facility] CATH LAB;  Service: Cardiovascular;  Laterality: N/A;  . Esophagogastroduodenoscopy N/A 02/15/2015    Procedure: ESOPHAGOGASTRODUODENOSCOPY (EGD);  Surgeon: Carol Ada, MD;  Location: [facility] ENDOSCOPY;  Service: Endoscopy;  Laterality: N/A;  LVAD   Family History  Problem Relation Age of Onset  . Coronary artery disease    . Heart attack Mother   . Coronary artery disease Mother   . Cancer Mother   . Heart disease Mother   . Hyperlipidemia Mother   . Hypertension Mother   . Coronary artery disease Father   . Stroke Father   . Cancer  Father   . Heart disease Father     before age 15  . Hyperlipidemia Father   . Hypertension Father   . Heart attack Father    History  Substance Use Topics  . Smoking status: Former Smoker -- 0.50 packs/day for 37 years    Types: Cigarettes    Quit date: 11/05/2001  . Smokeless tobacco: Never Used  . Alcohol Use: 0.0 oz/week     Comment: 05/25/2013 "mixed drink couple times/month"    Review of Systems  All other systems reviewed and are negative.     Allergies  Demerol; Meperidine hcl; and Opium  Home Medications   Prior to Admission medications   Medication Sig Start Date End Date Taking? Authorizing Provider  amiodarone (PACERONE) 200 MG tablet Take 1 tablet (200 mg total) by mouth 2 (two) times daily. 11/30/14   Jolaine Artist, MD  amLODipine (NORVASC) 10 MG tablet Take 10 mg by mouth every evening.    Historical Provider, MD  atorvastatin (LIPITOR) 80 MG tablet Take 80 mg by mouth every morning.    Historical Provider, MD  docusate sodium (COLACE) 100 MG capsule Take 200 mg by mouth every morning.    Historical Provider, MD  furosemide (LASIX) 20 MG tablet Take 20 mg by mouth daily as needed for fluid.     Historical Provider, MD  levothyroxine (SYNTHROID, LEVOTHROID) 175 MCG tablet Take 1 tablet (175 mcg total) by mouth daily before breakfast. 12/31/14   Deboraha Sprang, MD  magnesium gluconate (MAGONATE) 500 MG tablet Take 1,000 mg by mouth daily.    Historical Provider, MD  Multiple Vitamin (MULTIVITAMIN) tablet Take 1 tablet by mouth at bedtime.     Historical Provider, MD  Multiple Vitamins-Minerals (ICAPS PO) Take 1 tablet by mouth 2 (two) times daily.    Historical Provider, MD  nitroGLYCERIN (NITROSTAT) 0.4 MG SL tablet Place 0.4 mg under the tongue every 5 (five) minutes as needed for chest pain.     Historical Provider, MD  pantoprazole (PROTONIX) 40 MG tablet Take 1 tablet (40 mg total) by mouth 2 (two) times daily. 02/17/15   Amy D Ninfa Meeker, NP  promethazine  (PHENERGAN) 12.5 MG tablet Take 1 tablet (12.5 mg total) by mouth  every 6 (six) hours as needed for nausea or vomiting. 01/25/15   Larey Dresser, MD  RANEXA 500 MG 12 hr tablet TAKE 1 TABLET (500 MG TOTAL) BY MOUTH 2 (TWO) TIMES DAILY. 02/07/15   Larey Dresser, MD  ranolazine (RANEXA) 500 MG 12 hr tablet Take 500 mg by mouth 2 (two) times daily.    Historical Provider, MD  warfarin (COUMADIN) 2 MG tablet Takes 6 mg on 02/17/15  then resume 2mg  daily, except takes 4mg  on Monday, Wednesday and  Friday. 02/17/15   Amy D Ninfa Meeker, NP  zinc gluconate 50 MG tablet Take 100 mg by mouth daily.     Historical Provider, MD   Pulse 70  Temp(Src) 98.1 F (36.7 C) (Oral)  Resp 18 Physical Exam  Constitutional: He appears well-developed and well-nourished. No distress.  HENT:  Head: Normocephalic and atraumatic.  Right Ear: External ear normal.  Left Ear: External ear normal.  Eyes: Conjunctivae are normal. Right eye exhibits no discharge. Left eye exhibits no discharge. No scleral icterus.  Neck: Neck supple. No tracheal deviation present.  Cardiovascular:  Mechanical lvad sound auscaltated  Pulmonary/Chest: Effort normal and breath sounds normal. No stridor. No respiratory distress. He has no wheezes. He has no rales.  Abdominal: Soft. Bowel sounds are normal. He exhibits no distension. There is no tenderness. There is no rebound and no guarding.  Musculoskeletal: He exhibits no edema.       Lumbar back: He exhibits tenderness, bony tenderness and pain. He exhibits no swelling and no deformity.  Extremities are warm and well perfused  Neurological: He is alert. He has normal strength. No cranial nerve deficit (no facial droop, extraocular movements intact, no slurred speech) or sensory deficit. He exhibits normal muscle tone. He displays no seizure activity. Coordination normal.  Skin: Skin is warm and dry. No rash noted.  Psychiatric: He has a normal mood and affect.  Nursing note and vitals  reviewed.   ED Course  Procedures (including critical care time) Labs Review Labs Reviewed  CBC WITH DIFFERENTIAL/PLATELET  BASIC METABOLIC PANEL  PROTIME-INR    Imaging Review Dg Lumbar Spine Complete  02/20/2015   CLINICAL DATA:  Centralized lower back pain for 4 days. Fall greater than 1 week ago. Two prior low back surgeries.  EXAM: LUMBAR SPINE - COMPLETE 4+ VIEW  COMPARISON:  Abdominal radiograph 09/29/2014. CT abdomen and pelvis 09/09/2014.  FINDINGS: There are 5 non rib-bearing lumbar type vertebral bodies. Slight left convex curvature of the lumbar spine may be positional or reflect minimal scoliosis. There is a mild L4 superior endplate compression fracture which is new from the prior CT and most likely relatively acute. There is no listhesis. Mild disc space narrowing is noted at L4-5. Abdominal surgical clips are noted, in a left ventricular assist device is partially visualized.  IMPRESSION: Mild L4 compression fracture, likely acute.   Electronically Signed   By: Logan Bores   On: 02/20/2015 19:18     Medications  morphine 4 MG/ML injection 4 mg (4 mg Intravenous Given 02/20/15 1804)  morphine 4 MG/ML injection 4 mg (4 mg Intravenous Given 02/20/15 1842)    MDM   Final diagnoses:  Compression fracture of lumbar spine, non-traumatic, initial encounter    The patient's x-rays do show a lumbar compression fracture that is new compared to imaging tests at the end of last year. Patient did not recall any specific trauma initially although he does admit he may have fallen the week before  he was admitted to the hospital.  The compression fracture could be related to that or may be nontraumatic in nature.  Patient is still having pain. He does not feel that he is able to go home and manage his daily activities.  He has multiple complex issues , including an LVAD.  Disussed with cardiology.  Dr Haroldine Laws who will be coming to admit him to the hospital.    Dorie Rank, MD 02/20/15  2006

## 2015-02-20 NOTE — Progress Notes (Addendum)
ANTICOAGULATION CONSULT NOTE - Initial Consult  Pharmacy Consult for coumadin Indication: atrial fibrillation  Allergies  Allergen Reactions  . Demerol [Meperidine] Other (See Comments)    Paralysis. Could only move eyes.  . Meperidine Hcl Other (See Comments)    "CAN'T MOVE" CATATONIC PER PT.  [name] Opium Other (See Comments)    "CAN'T MOVE" CATATONIC PER PT.    Patient Measurements:   Heparin Dosing Weight:   Vital Signs: Temp: 98.1 F (36.7 C) (04/17 1717) Temp Source: Oral (04/17 1717) Pulse Rate: 68 (04/17 1948)  Labs:  Recent Labs  02/20/15 2015  HGB 9.9*  HCT 30.9*  PLT 207    Estimated Creatinine Clearance: 53.1 mL/min (by C-G formula based on Cr of 1.32).   Medical History: Past Medical History  Diagnosis Date  . Bradycardia   . Ventricular tachycardia     s/p AICD;   h/o ICD shock;  Amiodarone Rx. S/P ablation in 2012  . 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 HTN (hypertension)   . HLD (hyperlipidemia)   . Cytopenia   . Hypothyroidism   . Renal insufficiency   . Claudication   . Chronic systolic heart failure   . Ischemic cardiomyopathy     echo 7/12:  EF 25-30%, mild AI, mod MR, mod LAE  . TIA (transient ischemic attack) 2001  . CVD (cerebrovascular disease)     left CEA 2008  . Stroke   . Anemia   . Inappropriate therapy from implantable cardioverter-defibrillator 04/02/2013    ATP for sinus tach  SVT wavelet identified SVT but was no  passive   . Myocardial infarction 1992  . Anginal pain   . Automatic implantable cardioverter-defibrillator in situ     Medtronic Evera device serial number [id number] H    . Arthritis     "a little in my knees" (05/25/2013)  . Melanoma of ear 2013    "near left ear" (05/25/2013)  . 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 Dysrhythmia     HX A FIB  . History of nonmelanoma skin  cancer     L EAR  . Kidney stone   . History of transfusion   . Sleep apnea     denies wearing mask (05/25/2013)  . CAD (coronary artery disease)     s/p MI 1982;  s/p DES to CFX 2011;  s/p NSTEMI 4/12 in setting of VTach;  cath 7/12:  LM ok, LAD mid 30-40%, Dx's with 40%, mCFX stent patent, prox to mid RCA occluded with R to R and L to R collats.  Medical Tx was continued  . CHF (congestive heart failure)     HAS VENTRICULAR PUMP    Medications:  Scheduled:  . amiodarone  200 mg Oral BID  . amLODipine  10 mg Oral QPM  . [START ON 02/21/2015] atorvastatin  80 mg Oral BH-q7a  . [START ON 02/21/2015] docusate sodium  200 mg Oral BH-q7a  . ICAPS   Oral BID  . [START ON 02/21/2015] levothyroxine  175 mcg Oral QAC breakfast  . magnesium gluconate  1,000 mg Oral Daily  . pantoprazole  40 mg Oral BID  . ranolazine  500 mg Oral BID  . ranolazine  500 mg Oral BID   Infusions:    Assessment:  74 yo with MMP who was admitted for compression fractures. Pt also has an LVAD. He has been on coumadin for anticoagulation for afib/CVA. Coumadin has been ordered to be continued here. INR came back slightly above the modified goal of 2-2.5.   Goal of Therapy:  INR goal 2-2.5 Monitor platelets by anticoagulation protocol: Yes   Plan:   Coumadin 1mg  PO x1 tonight Daily INR  Onnie Boer, PharmD Pager: [phone] 02/20/2015 8:44 PM

## 2015-02-20 NOTE — Telephone Encounter (Signed)
Paged from [name] about [name] worsening lower back pain since discharge home last week. I called Raj to speak directly with him since [name] was on her way home from work. He states that the pain started with one of the falls he sustained last week (d/t anemia from GI bleed), worsened a little during hospitalization but prior to discharge was improving. Today he reports that the pain is severe in his lumbar spine, unrelieved by ice, heat, tylenol and rest. Pain is worse when he stands up and he is unable to bend over or walk very well. He denies radiating or shooting pain, numbness or tingling down one or both legs and denies having difficulty going to the bathroom. He reports that he had 2 previous back surgeries prior to his LVAD implant at [facility] and feels that the pain he is experiencing is similar to that time. Requesting possible neurology evaluation.   I discussed with him the options we have today--he can either make an urgent appointment with his PCP tomorrow or if the pain is too much he needs to be evaluated in the ED now. After discussing with [name] they decided that it would be best if he went to ED now for evaluation. ETA about 17:30. I notified the ED Charge RN and Rapid Response RN of his upcoming arrival (private vehicle) and briefly what he will be coming for. He will be evaluated by EDP since this seems to be non-VAD related issue. If VAD support needed or if they plan to admit patient Dr. Haroldine Laws is aware of his visit to the ED and we will both be available by pager.

## 2015-02-20 NOTE — ED Notes (Signed)
Doctor at bedside.

## 2015-02-20 NOTE — H&P (Addendum)
VAD TEAM History & Physical Note   Reason for Admission: LBP/ L4 compression fracture   HPI:   [name] is a 74 yo male with h/o VT, PAD and severe systolic HF (EF 71-06%) due to mixed ischemic/nonischemic cardiomyopathy.  He is s/p HM II LVAD implant for destination therapy on October 19, 2013. On 09/01/14 underwent cystoscopy and flexible ureteroscopy with lithotripsy and basket extraction.   VAD implantation was complicated by VT, syncope, A fib/Aflutter and persistent low output felt to be due to RHF. RHC showed low cardiac output but no evidence of RV failure on Milrinone. Milrinone has since been weaned off.   RHC (07/13/14): Well compensated hemodynamics with VAD support.  Admitted 11/9-11/19/15 for hematuria and chronic nephrolithiasis. Underwent Echo to assess RV for concern RV failure because he presented with SOB. RV looked good and had renal US showed mild hyronephrosis and bilateral renal cysts. Dr. Risa Grill took back for repeat lithotripsy and stone extraction by ureteroscopy and J stent placement. Received total of 3 PRBCs. D/C weight 188 lbs.   Admitted last week with syncope in setting of GI bleed and transfused 5u RBCs. 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 Presents to ER today with progressive low back pain. He states that the pain started with one of the falls he sustained last week. It worsened a little during hospitalization but prior to discharge was improving. Today he reports that the pain is severe in his lumbar spine, unrelieved by ice, heat, tylenol and rest. Pain is worse when he stands up and he is unable to bend over or walk very well. He denies radiating or shooting pain, numbness or tingling down one or both legs and denies having difficulty going to the bathroom. He reports that he had 2 previous back surgeries prior to his LVAD implant at [facility] and feels that the pain he is experiencing is similar to that time. In ER found to have  mild L4 compression fracture. Being admitted for pain controller and Ortho evaluation.    LVAD INTERROGATION:  HeartMate II LVAD:  Flow 4.2 liters/min, speed 9200, power 5.1, PI 8.1,  Occasional PI events     Review of Systems: All systems reviewed and negative except as per HPI.   Home Medications Prior to Admission medications   Medication Sig Start Date End Date Taking? Authorizing Provider  amiodarone (PACERONE) 200 MG tablet Take 1 tablet (200 mg total) by mouth 2 (two) times daily. 11/30/14  Yes Shaune Pascal Bensimhon, MD  amLODipine (NORVASC) 10 MG tablet Take 10 mg by mouth every evening.   Yes Historical Provider, MD  amoxicillin-clavulanate (AUGMENTIN) 875-125 MG per tablet Take 1 tablet by mouth 2 (two) times daily. 02/11/15  Yes Larey Dresser, MD  aspirin 81 MG tablet Take 81 mg by mouth every morning.    Yes Historical Provider, MD  atorvastatin (LIPITOR) 80 MG tablet Take 80 mg by mouth every morning.   Yes Historical Provider, MD  docusate sodium (COLACE) 100 MG capsule Take 200 mg by mouth every morning.   Yes Historical Provider, MD  ferrous gluconate (FERGON) 324 MG tablet Take 1 tablet (324 mg total) by mouth 3 (three) times daily with meals. 06/07/14  Yes Jolaine Artist, MD  furosemide (LASIX) 20 MG tablet Take 20 mg by mouth daily as needed for fluid.    Yes Historical Provider, MD  hydrALAZINE (APRESOLINE) 25 MG tablet  Take 1.5 tablets (37.5 mg total) by mouth 3 (three) times daily. 10/14/14  Yes Rande Brunt, NP  levothyroxine (SYNTHROID, LEVOTHROID) 175 MCG tablet Take 1 tablet (175 mcg total) by mouth daily before breakfast. 12/31/14  Yes Deboraha Sprang, MD  magnesium gluconate (MAGONATE) 500 MG tablet Take 1,000 mg by mouth daily.   Yes Historical Provider, MD  Multiple Vitamin (MULTIVITAMIN) tablet Take 1 tablet by mouth at bedtime.    Yes Historical Provider, MD  Multiple Vitamins-Minerals (ICAPS PO) Take 1 tablet by mouth 2 (two) times daily.   Yes Historical  Provider, MD  nitroGLYCERIN (NITROSTAT) 0.4 MG SL tablet Place 0.4 mg under the tongue every 5 (five) minutes as needed for chest pain.    Yes Historical Provider, MD  pantoprazole (PROTONIX) 40 MG tablet TAKE 1 TABLET (40 MG TOTAL) BY MOUTH DAILY. 09/01/14  Yes Jolaine Artist, MD  promethazine (PHENERGAN) 12.5 MG tablet Take 1 tablet (12.5 mg total) by mouth every 6 (six) hours as needed for nausea or vomiting. 01/25/15  Yes Larey Dresser, MD  RANEXA 500 MG 12 hr tablet TAKE 1 TABLET (500 MG TOTAL) BY MOUTH 2 (TWO) TIMES DAILY. 02/07/15  Yes Larey Dresser, MD  ranolazine (RANEXA) 500 MG 12 hr tablet Take 500 mg by mouth 2 (two) times daily.   Yes Historical Provider, MD  warfarin (COUMADIN) 2 MG tablet Take 2-4 mg by mouth at bedtime. Takes 2mg  daily, except takes 4mg  on Monday, Wednesday and  Friday.   Yes Historical Provider, MD  zinc gluconate 50 MG tablet Take 100 mg by mouth daily.    Yes Historical Provider, MD  enoxaparin (LOVENOX) 40 MG/0.4ML injection Inject 0.9 mLs (90 mg total) into the skin 2 (two) times daily. Inject into the skin of your abdomen twice a day. Patient not taking: Reported on 01/27/2015 12/17/14   Conrad Glasco, NP    Past Medical History: Past Medical History  Diagnosis Date  . Bradycardia   . Ventricular tachycardia     s/p AICD;   h/o ICD shock;  Amiodarone Rx. S/P ablation in 2012  . 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 HTN (hypertension)   . HLD (hyperlipidemia)   . Cytopenia   . Hypothyroidism   . Renal insufficiency   . Claudication   . Chronic systolic heart failure   . Ischemic cardiomyopathy     echo 7/12:  EF 25-30%, mild AI, mod MR, mod LAE  . TIA (transient ischemic attack) 2001  . CVD (cerebrovascular disease)     left CEA 2008  . Stroke   . Anemia   . Inappropriate therapy from implantable  cardioverter-defibrillator 04/02/2013    ATP for sinus tach  SVT wavelet identified SVT but was no  passive   . Myocardial infarction 1992  . Anginal pain   . Automatic implantable cardioverter-defibrillator in situ     Medtronic Evera device serial number [id number]    . Arthritis     "a little in my knees" (05/25/2013)  . Melanoma of ear 2013    "near left ear" (05/25/2013)  . 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 Dysrhythmia     HX A FIB  . History of nonmelanoma skin cancer     L EAR  .  Kidney stone   . History of transfusion   . Sleep apnea     denies wearing mask (05/25/2013)  . CAD (coronary artery disease)     s/p MI 1982;  s/p DES to CFX 2011;  s/p NSTEMI 4/12 in setting of VTach;  cath 7/12:  LM ok, LAD mid 30-40%, Dx's with 40%, mCFX stent patent, prox to mid RCA occluded with R to R and L to R collats.  Medical Tx was continued  . CHF (congestive heart failure)     HAS VENTRICULAR PUMP    Past Surgical History: Past Surgical History  Procedure Laterality Date  . Femoral-popliteal bypass graft Right 2011  . Cardiac defibrillator placement  2003; 2005; 05/25/2013    2014: Medtronic Evera device serial number [id number]  . Revascularization / in-situ graft leg    . Renal artery bypass Bilateral 2003  . Back surgery    . Lumbar disc surgery  1974; 2000's  . Knee arthroscopy Bilateral 1990's  . Elbow arthroscopy Right 1990's  . Cholecystectomy  12/15/?2010  . Lipoma excision Left 2013    "near ear" (05/25/2013)  . Heel spur surgery Right 1990's  . Cataract extraction w/ intraocular lens  implant, bilateral Bilateral 2000  . Carotid endarterectomy Left ~ 2007  . Doppler echocardiography  2011  . Tonsillectomy  1946  . Femoral-popliteal bypass graft Left 05/2002    Archie Endo 05/06/2002 (05/25/2013)  . Tumor excision Left 1960's    "fatty tumor" (05/25/2013)  . Cardiac electrophysiology study and ablation  2012  . Coronary angioplasty  1992  . Cardiac catheterization       "several" (05/25/2013)  . Coronary angioplasty with stent placement      "last one was 11/2012  (05/25/2013)  . Cardioversion N/A 10/15/2013    Procedure: CARDIOVERSION;  Surgeon: Jolaine Artist, MD;  Location: [facility];  Service: Cardiovascular;  Laterality: N/A;  . Insertion of implantable left ventricular assist device N/A 10/19/2013    Procedure: INSERTION OF IMPLANTABLE LEFT VENTRICULAR ASSIST DEVICE;  Surgeon: Gaye Pollack, MD;  Location: [place];  Service: Open Heart Surgery;  Laterality: N/A;  CIRC ARREST  NITRIC OXIDE  MEDTRONIC ICD  . Intraoperative transesophageal echocardiogram N/A 10/19/2013    Procedure: INTRAOPERATIVE TRANSESOPHAGEAL ECHOCARDIOGRAM;  Surgeon: Gaye Pollack, MD;  Location: [facility] OR;  Service: Open Heart Surgery;  Laterality: N/A;  . Tee without cardioversion N/A 11/04/2013    Procedure: TRANSESOPHAGEAL ECHOCARDIOGRAM (TEE);  Surgeon: Larey Dresser, MD;  Location: [place];  Service: Cardiovascular;  Laterality: N/A;  . Cardioversion N/A 11/04/2013    Procedure: CARDIOVERSION;  Surgeon: Larey Dresser, MD;  Location: [place];  Service: Cardiovascular;  Laterality: N/A;  . Cardioversion N/A 06/04/2014    Procedure: CARDIOVERSION;  Surgeon: Jolaine Artist, MD;  Location: [facility] ENDOSCOPY;  Service: Cardiovascular;  Laterality: N/A;  . Cystoscopy with retrograde pyelogram, ureteroscopy and stent placement Left 08/09/2014    Procedure: [place], URETEROSCOPY AND STENT PLACEMENT;  Surgeon: Bernestine Amass, MD;  Location: [facility] ORS;  Service: Urology;  Laterality: Left;  . Cystoscopy with retrograde pyelogram, ureteroscopy and stent placement Left 09/01/2014    Procedure: CYSTOSCOPY WITH URETEROSCOPY AND STENT REMOVAL;  Surgeon: Bernestine Amass, MD;  Location: [facility] ORS;  Service: Urology;  Laterality: Left;  . Holmium laser application Left 20/94/7096    Procedure: HOLMIUM LASER APPLICATION;  Surgeon: Bernestine Amass, MD;  Location: [facility]S;   Service: Urology;  Laterality: Left;  .  Cystoscopy with retrograde pyelogram, ureteroscopy and stent placement Left 09/20/2014    Procedure: CYSTOSCOPY WITH RETROGRADE PYELOGRAM, URETEROSCOPY AND STENT PLACEMENT, stone removal;  Surgeon: Bernestine Amass, MD;  Location: [facility]S;  Service: Urology;  Laterality: Left;  Stephanie Coup ablation N/A 09/12/2011    Procedure: V-TACH ABLATION;  Surgeon: Evans Lance, MD;  Location: [facility] CATH LAB;  Service: Cardiovascular;  Laterality: N/A;  . Left heart catheterization with coronary angiogram N/A 11/24/2012    Procedure: LEFT HEART CATHETERIZATION WITH CORONARY ANGIOGRAM;  Surgeon: Peter M Martinique, MD;  Location: [facility] CATH LAB;  Service: Cardiovascular;  Laterality: N/A;  . Implantable cardioverter defibrillator generator change N/A 05/25/2013    Procedure: IMPLANTABLE CARDIOVERTER DEFIBRILLATOR GENERATOR CHANGE;  Surgeon: Deboraha Sprang, MD;  Location: [facility] CATH LAB;  Service: Cardiovascular;  Laterality: N/A;  . Lead revision N/A 06/05/2013    Procedure: LEAD REVISION;  Surgeon: Thompson Grayer, MD;  Location: [facility] CATH LAB;  Service: Cardiovascular;  Laterality: N/A;  . V-tach ablation N/A 06/08/2013    Procedure: V-TACH ABLATION;  Surgeon: Evans Lance, MD;  Location: [facility] CATH LAB;  Service: Cardiovascular;  Laterality: N/A;  . Right heart catheterization N/A 09/17/2013    Procedure: RIGHT HEART CATH;  Surgeon: Jolaine Artist, MD;  Location: [facility] CATH LAB;  Service: Cardiovascular;  Laterality: N/A;  . Right heart catheterization N/A 10/14/2013    Procedure: RIGHT HEART CATH;  Surgeon: Jolaine Artist, MD;  Location: [facility] CATH LAB;  Service: Cardiovascular;  Laterality: N/A;  . Right heart catheterization N/A 11/16/2013    Procedure: RIGHT HEART CATH;  Surgeon: Jolaine Artist, MD;  Location: [facility] CATH LAB;  Service: Cardiovascular;  Laterality: N/A;  . Right heart catheterization N/A 07/13/2014    Procedure: RIGHT HEART CATH;  Surgeon: Jolaine Artist, MD;  Location: [facility]  CATH LAB;  Service: Cardiovascular;  Laterality: N/A;  . Esophagogastroduodenoscopy N/A 02/15/2015    Procedure: ESOPHAGOGASTRODUODENOSCOPY (EGD);  Surgeon: Carol Ada, MD;  Location: [facility] ENDOSCOPY;  Service: Endoscopy;  Laterality: N/A;  LVAD    Family History: Family History  Problem Relation Age of Onset  . Coronary artery disease    . Heart attack Mother   . Coronary artery disease Mother   . Cancer Mother   . Heart disease Mother   . Hyperlipidemia Mother   . Hypertension Mother   . Coronary artery disease Father   . Stroke Father   . Cancer Father   . Heart disease Father     before age 46  . Hyperlipidemia Father   . Hypertension Father   . Heart attack Father     Social History: History   Social History  . Marital Status: Divorced    Spouse Name: N/A  . Number of Children: 2  . Years of Education: N/A   Occupational History  . retired     [employer]  . 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 Smoking status: Former Smoker -- 0.50 packs/day for 37 years    Types: Cigarettes    Quit date: 11/05/2001  . Smokeless tobacco: Never Used  . Alcohol Use: 0.0 oz/week     Comment: 05/25/2013 "mixed drink couple times/month"  . Drug Use: No  . Sexual Activity: No   Other Topics Concern  . None   Social History Narrative   Lives with sig other.  Allergies:  Allergies  Allergen Reactions  . Demerol [Meperidine] Other (See Comments)    Paralysis. Could only move eyes.    Objective:    Vital Signs:   Temp:  [97.8 F (36.6 C)-98.1 F (36.7 C)] 97.8 F (36.6 C) (04/17 2115) Pulse Rate:  [68-70] 70 (04/17 2115) Resp:  [16-18] 18 (04/17 2115) SpO2:  [98 %-100 %] 98 % (04/17 2115) Weight:  [87.4 kg (192 lb 10.9 oz)] 87.4 kg (192 lb 10.9 oz) (04/17 2115)   Filed Weights   02/20/15 2115  Weight: 87.4 kg (192 lb 10.9 oz)    Mean arterial Pressure 78  Physical Exam: General:   Mildly uncomfortable. . No resp difficulty HEENT: normal Neck: supple. JVP 8-9 cm. Carotids 2+ bilat; no bruits. No lymphadenopathy or thryomegaly appreciated. Cor: Mechanical heart sounds with LVAD hum present. Lungs: clear Abdomen: soft, nontender, nondistended. No hepatosplenomegaly. No bruits or masses. Good bowel sounds. Driveline: C/D/I; securement device intact and driveline incorporated Extremities: no cyanosis, clubbing, rash.  no edema Neuro: alert & orientedx3, cranial nerves grossly intact. moves all 4 extremities w/o difficulty. Affect pleasant. Sensation intact  Telemetry: Sinus rhythm in 70s  Labs: Basic Metabolic Panel:  Recent Labs Lab 02/14/15 0426 02/15/15 0935 02/16/15 0315 02/17/15 0428 02/20/15 2015  NA 139 139 138 137 134*  K 4.2 4.3 4.3 4.7 4.0  CL 107 107 105 104 103  CO2 30 25 24 25 24   GLUCOSE 107* 106* 99 108* 115*  BUN 30* 20 19 17 18   CREATININE 1.40* 1.33 1.38* 1.32 1.43*  CALCIUM 8.0* 8.3* 8.0* 8.3* 8.2*    Liver Function Tests: No results for input(s): AST, ALT, ALKPHOS, BILITOT, PROT, ALBUMIN in the last 168 hours. No results for input(s): LIPASE, AMYLASE in the last 168 hours. No results for input(s): AMMONIA in the last 168 hours.  CBC:  Recent Labs Lab 02/14/15 0426 02/15/15 0935 02/16/15 0315 02/17/15 0428 02/20/15 2015  WBC 5.5 5.7 4.9 4.4 5.7  NEUTROABS  --   --   --   --  3.4  HGB 8.0* 9.4* 8.8* 9.2* 9.9*  HCT 24.5* 28.7* 27.2* 28.6* 30.9*  MCV 92.8 93.2 95.1 94.1 93.9  PLT 133* 149* 134* 140* 207    Cardiac Enzymes: No results for input(s): CKTOTAL, CKMB, CKMBINDEX, TROPONINI in the last 168 hours.  BNP: BNP (last 3 results)  Recent Labs  11/25/14 1335  BNP 420.8*    ProBNP (last 3 results)  Recent Labs  09/09/14 1038 09/13/14 1024 10/14/14 0936  PROBNP 5067.0* 5152.0* 1880.0*     CBG:  Recent Labs Lab 02/16/15 0638  GLUCAP 108*    Coagulation Studies:  Recent Labs  02/20/15 2015    LABPROT 30.0*  INR 2.84*    Other results: EKG: Pending  Imaging: Dg Lumbar Spine Complete  02/20/2015   CLINICAL DATA:  Centralized lower back pain for 4 days. Fall greater than 1 week ago. Two prior low back surgeries.  EXAM: LUMBAR SPINE - COMPLETE 4+ VIEW  COMPARISON:  Abdominal radiograph 09/29/2014. CT abdomen and pelvis 09/09/2014.  FINDINGS: There are 5 non rib-bearing lumbar type vertebral bodies. Slight left convex curvature of the lumbar spine may be positional or reflect minimal scoliosis. There is a mild L4 superior endplate compression fracture which is new from the prior CT and most likely relatively acute. There is no listhesis. Mild disc space narrowing is noted at L4-5. Abdominal surgical clips are noted, in a left ventricular assist device is partially  visualized.  IMPRESSION: Mild L4 compression fracture, likely acute.   Electronically Signed   By: Logan Bores   On: 02/20/2015 19:18        Assessment:  [name] is a 74 yo male with h/o VT, PAD and severe systolic HF (EF 28-36%) due to mixed ischemic/nonischemic cardiomyopathy.  He is s/p HM II LVAD implant for destination therapy on October 19, 2013. Recently admitted for UGIB with 2 jejunal AVMs. Now presents with progressive LBP due to L4 compression fracture  1. Low back pain 2. Acute L4 compression fracture 3. Chronic systolic CHF: s/p HM II  LVAD present. 4. Paroxysmal atrial fibrillation - currently in NSR 5. Recent UGIB s/p clipping of 2 jejunal AVMS  Plan/Discussion:   L4 compression fracture likely due to fall last week. Will admit for pain control and ortho to see. No instability or neurovascular compromise on exam. Will check labs. Hopefully home in 24-48 hours.  I reviewed the LVAD parameters from today, and compared the results to the patient's prior recorded data.  No programming changes were made.  The LVAD is functioning within specified parameters.  The patient performs LVAD self-test daily.   LVAD interrogation was negative for any significant power changes, alarms or PI events/speed drops.  LVAD equipment check completed and is in good working order.  Back-up equipment present.   LVAD education done on emergency procedures and precautions and reviewed exit site care.  Length of Stay:   Glori Bickers MD 02/20/2015, 9:31 PM VAD Team Pager [phone] (7am - 7am) +++VAD ISSUES ONLY+++  Advanced Heart Failure Team Pager [phone] (M-F; [place])  Please contact [facility] for night-coverage after hours (4p -7a ) and weekends on [email or web address] for all non- LVAD Issues

## 2015-02-20 NOTE — ED Notes (Signed)
Patient developed sudden onset of nausea. States it was the morphine and had an episode of emesis. Doctor notified.

## 2015-02-20 NOTE — ED Notes (Signed)
LVAD coordinator aware. Bensimone aware. Rapid Response aware.

## 2015-02-21 ENCOUNTER — Encounter (HOSPITAL_COMMUNITY): Payer: Self-pay | Admitting: *Deleted

## 2015-02-21 DIAGNOSIS — Z95811 Presence of heart assist device: Secondary | ICD-10-CM | POA: Diagnosis not present

## 2015-02-21 DIAGNOSIS — S32040A Wedge compression fracture of fourth lumbar vertebra, initial encounter for closed fracture: Secondary | ICD-10-CM | POA: Diagnosis not present

## 2015-02-21 DIAGNOSIS — I5022 Chronic systolic (congestive) heart failure: Secondary | ICD-10-CM | POA: Diagnosis not present

## 2015-02-21 LAB — PROTIME-INR
INR: 2.87 — ABNORMAL HIGH (ref 0.00–1.49)
Prothrombin Time: 30.3 seconds — ABNORMAL HIGH (ref 11.6–15.2)

## 2015-02-21 LAB — LACTATE DEHYDROGENASE: LDH: 221 U/L (ref 94–250)

## 2015-02-21 MED ORDER — WARFARIN SODIUM 1 MG PO TABS
1.0000 mg | ORAL_TABLET | Freq: Once | ORAL | Status: AC
  Administered 2015-02-21: 1 mg via ORAL
  Filled 2015-02-21: qty 1

## 2015-02-21 NOTE — Progress Notes (Signed)
Patient ID: [name], male   DOB: 1941-02-24, 74 y.o.   MRN: [id number] I spoke with one of my partners who is a spine specialist about [name]'s L4 compression fracture.  He feels that a back brace is not needed in this situation and he should do well with time.  I'll talk with him further tomorrow.

## 2015-02-21 NOTE — Progress Notes (Addendum)
Patient was scheduled to have RAMP echo this week, however will complete tomorrow while inpatient.

## 2015-02-21 NOTE — Progress Notes (Addendum)
HeartMate 2 Rounding Note  Subjective:   Admitted with lower back pain that was from L4 compression fracture. Today he was evaluated by Dr Ninfa Linden.   Still with significant back pain. Encouraged him to take tramadol.   INR 2.8   LVAD INTERROGATION:  HeartMate II LVAD:  Flow 4.8 liters/min, speed 9200, power 5, PI 6.  + PI events  Objective:    Vital Signs:   Temp:  [97.8 F (36.6 C)-98.2 F (36.8 C)] 98.2 F (36.8 C) (04/18 0433) Pulse Rate:  [68-70] 69 (04/18 0433) Resp:  [16-18] 16 (04/18 0433) SpO2:  [96 %-100 %] 96 % (04/18 0433) Weight:  [192 lb 10.9 oz (87.4 kg)] 192 lb 10.9 oz (87.4 kg) (04/18 0433) Last BM Date: 02/21/15 Mean arterial Pressure 76   Intake/Output:  No intake or output data in the 24 hours ending 02/21/15 0813   Physical Exam: General:  Well appearing. No resp difficulty HEENT: normal Neck: supple. JVP . Carotids 2+ bilat; no bruits. No lymphadenopathy or thryomegaly appreciated. Cor: Mechanical heart sounds with LVAD hum present. Lungs: clear Abdomen: soft, nontender, nondistended. No hepatosplenomegaly. No bruits or masses. Good bowel sounds. Driveline: C/D/I; securement device intact and driveline incorporated Extremities: no cyanosis, clubbing, rash, edema Neuro: alert & orientedx3, cranial nerves grossly intact. moves all 4 extremities w/o difficulty. Affect pleasant  Telemetry: NSR  Labs: Basic Metabolic Panel:  Recent Labs Lab 02/15/15 0935 02/16/15 0315 02/17/15 0428 02/20/15 2015  NA 139 138 137 134*  K 4.3 4.3 4.7 4.0  CL 107 105 104 103  CO2 25 24 25 24   GLUCOSE 106* 99 108* 115*  BUN 20 19 17 18   CREATININE 1.33 1.38* 1.32 1.43*  CALCIUM 8.3* 8.0* 8.3* 8.2*    Liver Function Tests: No results for input(s): AST, ALT, ALKPHOS, BILITOT, PROT, ALBUMIN in the last 168 hours. No results for input(s): LIPASE, AMYLASE in the last 168 hours. No results for input(s): AMMONIA in the last 168 hours.  CBC:  Recent Labs Lab  02/15/15 0935 02/16/15 0315 02/17/15 0428 02/20/15 2015  WBC 5.7 4.9 4.4 5.7  NEUTROABS  --   --   --  3.4  HGB 9.4* 8.8* 9.2* 9.9*  HCT 28.7* 27.2* 28.6* 30.9*  MCV 93.2 95.1 94.1 93.9  PLT 149* 134* 140* 207    INR:  Recent Labs Lab 02/15/15 0335 02/16/15 0315 02/17/15 0428 02/20/15 2015 02/21/15 0332  INR 1.98* 1.94* 1.89* 2.84* 2.87*    Other results:    Imaging: Dg Lumbar Spine Complete  02/20/2015   CLINICAL DATA:  Centralized lower back pain for 4 days. Fall greater than 1 week ago. Two prior low back surgeries.  EXAM: LUMBAR SPINE - COMPLETE 4+ VIEW  COMPARISON:  Abdominal radiograph 09/29/2014. CT abdomen and pelvis 09/09/2014.  FINDINGS: There are 5 non rib-bearing lumbar type vertebral bodies. Slight left convex curvature of the lumbar spine may be positional or reflect minimal scoliosis. There is a mild L4 superior endplate compression fracture which is new from the prior CT and most likely relatively acute. There is no listhesis. Mild disc space narrowing is noted at L4-5. Abdominal surgical clips are noted, in a left ventricular assist device is partially visualized.  IMPRESSION: Mild L4 compression fracture, likely acute.   Electronically Signed   By: Logan Bores   On: 02/20/2015 19:18      Medications:     Scheduled Medications: . amiodarone  200 mg Oral BID  . amLODipine  10 mg  Oral QPM  . atorvastatin  80 mg Oral BH-q7a  . docusate sodium  200 mg Oral BH-q7a  . levothyroxine  175 mcg Oral QAC breakfast  . magnesium gluconate  1,000 mg Oral Daily  . multivitamin-lutein   Oral Daily  . pantoprazole  40 mg Oral BID  . ranolazine  500 mg Oral BID  . Warfarin - Pharmacist Dosing Inpatient   Does not apply q1800     Infusions:     PRN Medications:  acetaminophen **OR** acetaminophen, furosemide, nitroGLYCERIN, oxyCODONE-acetaminophen, promethazine, traMADol   Assessment:  1. Lumbar Compression Fracture  2.. 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 Anemia - S/P EGD with 2 AVMs clipped in jejunum. 3. CAD 4.  Paroxysmal atrial fibrillation 5.  H/O 09/2015 Nephrolithiasis with J stent 6. Chronic Anticoagulation - on coumadin - Goal for INR 2-2.5    Plan/Discussion:   Appreciate Dr Adela Ports input. He can be up with ambulation. He will review films with spine specialist.   Hgb stable. Continue protonix. No aspirin with recent bleed. INR today 2.87. Pharmacy following for coumadin  From HF perspective he is stable. Continue current regimen.    I reviewed the LVAD parameters from today, and compared the results to the patient's prior recorded data.  No programming changes were made.  The LVAD is functioning within specified parameters.  The patient performs LVAD self-test daily.  LVAD interrogation was negative for any significant power changes, alarms or PI events/speed drops.  LVAD equipment check completed and is in good working order.  Back-up equipment present.   LVAD education done on emergency procedures and precautions and reviewed exit site care.  Length of Stay: 1  CLEGG,AMY NP-C  02/21/2015, 8:13 AM  VAD Team --- VAD ISSUES ONLY--- Pager [phone] (7am - 7am)  Advanced Heart Failure Team  Pager [phone] (M-F; 7a - 4p)  Please contact [facility] for night-coverage after hours (4p -7a ) and weekends on [email or web address]  Patient seen and examined with Darrick Grinder, NP. We discussed all aspects of the encounter. I agree with the assessment and plan as stated above.   Much improved. Will have PT see. Appreciate Dr. Trevor Mace eval. Wait to hear about back brace. HF and VAD parameters stable.   Probably home in am.   Benay Spice 8:27 AM

## 2015-02-21 NOTE — Progress Notes (Signed)
ANTICOAGULATION CONSULT NOTE - [facility] for coumadin Indication: atrial fibrillation  Allergies  Allergen Reactions  . Demerol [Meperidine] Other (See Comments)    Paralysis. Could only move eyes.    Patient Measurements: Height: 6' (182.9 cm) Weight: 192 lb 10.9 oz (87.4 kg) IBW/kg (Calculated) : 77.6 Heparin Dosing Weight:   Vital Signs: Temp: 98.2 F (36.8 C) (04/18 0433) Temp Source: Oral (04/18 0433) Pulse Rate: 70 (04/18 0853)  Labs:  Recent Labs  02/20/15 2015 02/21/15 0332  HGB 9.9*  --   HCT 30.9*  --   PLT 207  --   LABPROT 30.0* 30.3*  INR 2.84* 2.87*  CREATININE 1.43*  --     Estimated Creatinine Clearance: 49 mL/min (by C-G formula based on Cr of 1.43).   Medical History: Past Medical History  Diagnosis Date  . Bradycardia   . Ventricular tachycardia     s/p AICD;   h/o ICD shock;  Amiodarone Rx. S/P ablation in 2012  . 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 HTN (hypertension)   . HLD (hyperlipidemia)   . Cytopenia   . Hypothyroidism   . Renal insufficiency   . Claudication   . Chronic systolic heart failure   . Ischemic cardiomyopathy     echo 7/12:  EF 25-30%, mild AI, mod MR, mod LAE  . TIA (transient ischemic attack) 2001  . CVD (cerebrovascular disease)     left CEA 2008  . Stroke   . Anemia   . Inappropriate therapy from implantable cardioverter-defibrillator 04/02/2013    ATP for sinus tach  SVT wavelet identified SVT but was no  passive   . Myocardial infarction 1992  . Anginal pain   . Automatic implantable cardioverter-defibrillator in situ     Medtronic Evera device serial number [id number]    . Arthritis     "a little in my knees" (05/25/2013)  . Melanoma of ear 2013    "near left ear" (05/25/2013)  . 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 Dysrhythmia     HX A FIB   . History of nonmelanoma skin cancer     L EAR  . Kidney stone   . History of transfusion   . Sleep apnea     denies wearing mask (05/25/2013)  . CAD (coronary artery disease)     s/p MI 1982;  s/p DES to CFX 2011;  s/p NSTEMI 4/12 in setting of VTach;  cath 7/12:  LM ok, LAD mid 30-40%, Dx's with 40%, mCFX stent patent, prox to mid RCA occluded with R to R and L to R collats.  Medical Tx was continued  . CHF (congestive heart failure)     HAS VENTRICULAR PUMP    Medications:  Scheduled:  . amiodarone  200 mg Oral BID  . amLODipine  10 mg Oral QPM  . atorvastatin  80 mg Oral BH-q7a  . docusate sodium  200 mg Oral BH-q7a  . levothyroxine  175 mcg Oral QAC breakfast  . magnesium gluconate  1,000 mg Oral Daily  . multivitamin-lutein   Oral Daily  . pantoprazole  40 mg Oral BID  . ranolazine  500 mg Oral BID  . Warfarin - Pharmacist Dosing Inpatient   Does not apply [phone]  Assessment: 74 yo with MMP who was admitted for compression fractures. Pt also has an LVAD. He has been on coumadin for anticoagulation for afib/CVA. Coumadin has been ordered to be continued here. INR today slightly above the modified goal of 2-2.5. No bleeding or complications noted.  Goal of Therapy:  INR goal 2-2.5 Monitor platelets by anticoagulation protocol: Yes   Plan:  Repeat Coumadin 1mg  PO x1 tonight Daily INR  Uvaldo Rising, BCPS  Clinical Pharmacist Pager [phone]  02/21/2015 11:43 AM

## 2015-02-21 NOTE — Evaluation (Signed)
Physical Therapy Evaluation Patient Details Name: [name] MRN: [id number] DOB: 14-Feb-1941 Today's Date: 02/21/2015   History of Present Illness  Adm 4/17 with low back pain, +L4 compression fx, Pt recently d/c'd home s/p GI bleed (had several falls related to low Hgb PTA)  PMHx- LVAD 12/14; back surgeries; CHF  Clinical Impression  Patient is s/p above fracture resulting in the deficits listed below (see PT Problem List).  Patient will benefit from skilled PT to increase their independence and safety with mobility (while adhering to their precautions) to allow discharge to the venue listed below.     Follow Up Recommendations No PT follow up;Supervision for mobility/OOB    Equipment Recommendations   (TBA, may need 2 wheel rolling walker)    Recommendations for Other Services OT consult     Precautions / Restrictions Precautions Precautions: Fall;Other (comment) Precaution Comments: LVAD; take emergency bag when out of room      Mobility  Bed Mobility Overal bed mobility: Needs Assistance Bed Mobility: Rolling;Sidelying to Sit;Sit to Sidelying Rolling: Supervision Sidelying to sit: Supervision     Sit to sidelying: Supervision General bed mobility comments: educated on technique to minimize use of abdominals and reduce strain/pain  Transfers Overall transfer level: Needs assistance Equipment used: Rolling walker (2 wheeled) Transfers: Sit to/from Stand Sit to Stand: Min guard         General transfer comment: slow, however no physical assist needed  Ambulation/Gait Ambulation/Gait assistance: Min guard Ambulation Distance (Feet): 150 Feet Assistive device: Rolling walker (2 wheeled) Gait Pattern/deviations: Step-through pattern;Decreased stride length;Trunk flexed   Gait velocity interpretation: Below normal speed for age/gender General Gait Details: educated on proper position relative to RW and light use of UEs to extend/offload spine  Stairs             Wheelchair Mobility    Modified Rankin (Stroke Patients Only)       Balance                                             Pertinent Vitals/Pain Pain Assessment: 0-10 Pain Score: 8  Pain Location: lumbar Pain Intervention(s): Limited activity within patient's tolerance;Monitored during session;Repositioned;Patient requesting pain meds-RN notified;RN gave pain meds during session    Home Living Family/patient expects to be discharged to:: Private residence Living Arrangements: Spouse/significant other Available Help at Discharge: Family;Available 24 hours/day Type of Home: House Home Access: Level entry     Home Layout: Two level;Able to live on main level with bedroom/bathroom Home Equipment: Walker - 4 wheels;Cane - single point;Shower seat;Hand held shower head (loaned his 2 wheeled RW to family member)      Prior Function Level of Independence: Independent         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[employer]; 5 hrs/day x 5 days/week     Hand Dominance        Extremity/Trunk Assessment   Upper Extremity Assessment: Overall WFL for tasks assessed           Lower Extremity Assessment: Overall WFL for tasks assessed      Cervical / Trunk Assessment: Kyphotic  Communication   Communication: No difficulties  Cognition Arousal/Alertness: Awake/alert Behavior During Therapy: WFL for tasks assessed/performed Overall Cognitive Status: Within Functional Limits for tasks assessed  General Comments General comments (skin integrity, edema, etc.): Educated on avoiding flexion of spine (including standing at sink activities, positioning in bed). Recommended limit sitting due to incr pressure on spine (walking and supine with knees slightly bent best positions). Answered questions related to brace vs ?ability to have kyphoplasty. Dr. Jeffie [name] in hall and stated would be possible to have a brace,  however would need to accomodate driveline exiting his side.    Exercises        Assessment/Plan    PT Assessment Patient needs continued PT services  PT Diagnosis Difficulty walking;Acute pain   PT Problem List Decreased activity tolerance;Decreased mobility;Decreased knowledge of use of DME;Decreased knowledge of precautions;Pain  PT Treatment Interventions DME instruction;Gait training;Functional mobility training;Therapeutic activities;Patient/family education   PT Goals (Current goals can be found in the Care Plan section) Acute Rehab PT Goals Patient Stated Goal: decr pain to allow return to work PT Goal Formulation: With patient Time For Goal Achievement: 02/24/15 Potential to Achieve Goals: Good    Frequency Min 3X/week   Barriers to discharge        Co-evaluation               End of Session   Activity Tolerance: Patient limited by pain Patient left: in bed;with call bell/phone within reach Nurse Communication: Mobility status (avoid excessive sitting)         Time: 1030-1314 PT Time Calculation (min) (ACUTE ONLY): 29 min   Charges:   PT Evaluation $Initial PT Evaluation Tier I: 1 Procedure PT Treatments $Therapeutic Activity: 8-22 mins   PT G Codes:        [name] 03/14/2015, 9:36 AM Pager [phone]

## 2015-02-21 NOTE — Progress Notes (Signed)
Patient ID: [name], male   DOB: 24-Feb-1941, 74 y.o.   MRN: [id number] I did speak with [name] and examine his back.  He is point tender over the lower lumbar spine.  He has good strength and sensation in his legs.  He reports back pain mainly with ambulation.  I did review his films which show a mild compression fracture at L4 - minimal superior end plate.  He can be up with therapy from my standpoint and I will check with a spine specialist to see if a lumbar back brace is warranted in this situation.

## 2015-02-22 DIAGNOSIS — Z950 Presence of cardiac pacemaker: Secondary | ICD-10-CM | POA: Diagnosis not present

## 2015-02-22 DIAGNOSIS — Z95811 Presence of heart assist device: Secondary | ICD-10-CM | POA: Diagnosis not present

## 2015-02-22 DIAGNOSIS — I5022 Chronic systolic (congestive) heart failure: Secondary | ICD-10-CM | POA: Diagnosis not present

## 2015-02-22 DIAGNOSIS — S32040A Wedge compression fracture of fourth lumbar vertebra, initial encounter for closed fracture: Secondary | ICD-10-CM | POA: Diagnosis not present

## 2015-02-22 DIAGNOSIS — I509 Heart failure, unspecified: Secondary | ICD-10-CM | POA: Diagnosis not present

## 2015-02-22 DIAGNOSIS — M545 Low back pain: Secondary | ICD-10-CM | POA: Diagnosis not present

## 2015-02-22 LAB — PROTIME-INR
INR: 2.49 — AB (ref 0.00–1.49)
PROTHROMBIN TIME: 27.1 s — AB (ref 11.6–15.2)

## 2015-02-22 LAB — LACTATE DEHYDROGENASE: LDH: 231 U/L (ref 94–250)

## 2015-02-22 MED ORDER — OXYCODONE-ACETAMINOPHEN 5-325 MG PO TABS
1.0000 | ORAL_TABLET | ORAL | Status: DC | PRN
  Administered 2015-02-22: 2 via ORAL
  Filled 2015-02-22: qty 2

## 2015-02-22 MED ORDER — WARFARIN SODIUM 2 MG PO TABS
2.0000 mg | ORAL_TABLET | Freq: Once | ORAL | Status: AC
  Administered 2015-02-22: 2 mg via ORAL
  Filled 2015-02-22: qty 1

## 2015-02-22 MED ORDER — WARFARIN SODIUM 2 MG PO TABS: ORAL_TABLET | ORAL | Status: DC

## 2015-02-22 MED ORDER — OXYCODONE-ACETAMINOPHEN 5-325 MG PO TABS: 1.0000 | ORAL_TABLET | ORAL | Status: DC | PRN

## 2015-02-22 NOTE — Progress Notes (Signed)
Speed  Flow  PI  Power  LVIDD  AI  Aortic openings  MR  TR  Septum  RV   9200 4.6 5.8 5.1 5.3 none 0/0  none none midline Mild HK   9000  4.3 6.6 4.9 5.4 none 0/0 none none Bowing to RV   9400  4.6 6.4 5.3      Bowing to LV                                             Ramp ECHO performed at bedside per Dr. Haroldine Laws.  At completion of ramp study, patients primary and back up controller programmed:  Fixed speed: 9200  Low speed limit: 8600

## 2015-02-22 NOTE — Progress Notes (Signed)
Patient ID: [name], male   DOB: [birth year], 74 y.o.   MRN: [id number] I spoke with him this morning about increasing his activities as comfort allows, but to avoid heavy lifting.  He understands that his L4 compression fracture should do well with time and that I will want to x-ray his L-spine in about 2 weeks.

## 2015-02-22 NOTE — Progress Notes (Signed)
NCM contacted Darrick Grinder NP. UR completed. Jonnie Finner RN CCM Case Mgmt phone [phone]

## 2015-02-22 NOTE — Progress Notes (Signed)
02/22/2015 1200 Chart reviewed. Utilization review complete. Jonnie Finner RN CCM Case Mgmt phone [phone]

## 2015-02-22 NOTE — Progress Notes (Signed)
DC IV and tele per MD orders and protocol; DC instructions reviewed with Iona Beard and [name]; no further questions; will go to follow up appointment next Thursday; paper prescription given for Percocet.  Rowe Pavy, RN

## 2015-02-22 NOTE — Progress Notes (Signed)
  Echocardiogram 2D Echocardiogram has been performed.  [name] 02/22/2015, 1:45 PM

## 2015-02-22 NOTE — Progress Notes (Signed)
Physical Therapy Treatment Patient Details Name: [name] MRN: [id number] DOB: June 23, 1941 Today's Date: 02/22/2015    History of Present Illness Adm 4/17 with low back pain, +L4 compression fx, Pt recently d/c'd home s/p GI bleed (had several falls related to low Hgb PTA)  PMHx- LVAD 12/14; back surgeries; CHF    PT Comments    Pt able to verbalize most of education provided 4/18 re: posture and limiting static sitting and standing. Required cues for proper bed mobility techniques. Today was less sturdy when turning with RW (pt reported the RW was "pulling to the Rt" which was confirmed after he returned to sitting. RW adjusted with less deviation to the Rt noted).    Follow Up Recommendations  No PT follow up;Supervision for mobility/OOB     Equipment Recommendations  None recommended by PT (confirmed with girlfriend he has a 2 wheel RW)    Recommendations for Other Services OT consult     Precautions / Restrictions Precautions Precautions: Fall;Other (comment) Precaution Comments: LVAD; take emergency bag when out of room Restrictions Weight Bearing Restrictions: No    Mobility  Bed Mobility Overal bed mobility: Needs Assistance Bed Mobility: Rolling;Sidelying to Sit;Sit to Sidelying Rolling: Supervision Sidelying to sit: Supervision     Sit to sidelying: Supervision General bed mobility comments: re-educated on technique to minimize use of abdominals and reduce strain/pain (pt began to pull up from supine prior to education)  Transfers Overall transfer level: Needs assistance Equipment used: Rolling walker (2 wheeled) Transfers: Sit to/from Stand Sit to Stand: Min guard         General transfer comment: vc for hand placement; slow, however no physical assist needed  Ambulation/Gait Ambulation/Gait assistance: Min assist Ambulation Distance (Feet): 300 Feet Assistive device: Rolling walker (2 wheeled) Gait Pattern/deviations: Step-through  pattern;Decreased stride length;Drifts right/left;Trunk flexed;Wide base of support   Gait velocity interpretation: Below normal speed for age/gender General Gait Details: re-educated on proper position relative to RW and light use of UEs to extend/offload spine; pt tending to let RW get too far ahead; during turns his feet ended up outside the back legs of RW with near trip;  stagger to his Rt x 3; educated to stop and re-group when RW gets too far ahead   Financial trader Rankin (Stroke Patients Only)       Balance                                    Cognition Arousal/Alertness: Awake/alert Behavior During Therapy: WFL for tasks assessed/performed Overall Cognitive Status: Within Functional Limits for tasks assessed                      Exercises      General Comments General comments (skin integrity, edema, etc.): Educated on avoiding flexion of spine (including standing at sink activities, positioning in bed). Recommended limit sitting due to incr pressure on spine (walking and supine with knees slightly bent best positions). Educated on use of pain scale as a guide (keep pain <8) and use of pain medicine to allow him to perform ADLs and walking.      Pertinent Vitals/Pain Pain Assessment: 0-10 Pain Score: 5  Pain Location: back Pain Intervention(s): Limited activity within patient's tolerance;Monitored during session;Repositioned (reviewed no flexion, no lifting, limit sitting and standing)  Home Living                      Prior Function            PT Goals (current goals can now be found in the care plan section) Acute Rehab PT Goals Patient Stated Goal: decr pain to allow return to work PT Goal Formulation: With patient Time For Goal Achievement: 02/24/15 Potential to Achieve Goals: Good Progress towards PT goals: Progressing toward goals    Frequency  Min 3X/week    PT Plan Current  plan remains appropriate    Co-evaluation             End of Session   Activity Tolerance: Patient limited by pain Patient left: in bed;with call bell/phone within reach     Time: 0852-0913 PT Time Calculation (min) (ACUTE ONLY): 21 min  Charges:  $Gait Training: 8-22 mins                    G Codes:      Abigale Dorow 03-15-2015, 9:23 AM Pager [phone]

## 2015-02-22 NOTE — Discharge Summary (Signed)
Advanced Heart Failure Team  Discharge Summary   Patient ID: [name] MRN: [id number], DOB/AGE: 1941/09/15 74 y.o. Admit date: 02/20/2015 D/C date:     02/22/2015   Primary Discharge Diagnoses:  1. Lumbar Compression Fracture - L4  2.. 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 Anemia - S/P EGD with 2 AVMs clipped in jejunum. 3. CAD 4. Paroxysmal atrial fibrillation 5. H/O 09/2015 Nephrolithiasis with J stent 6. Chronic Anticoagulation - on coumadin - Goal for INR 2-2.5   Hospital Course:  Mr Gang is a 74 yo male with h/o VT, PAD and severe systolic HF (EF 38-75%) due to mixed ischemic/nonischemic cardiomyopathy. He is s/p HM II LVAD implant for destination therapy on October 19, 2013. On 09/01/14 underwent cystoscopy and flexible ureteroscopy with lithotripsy and basket extraction.   Admitted with severe lower back pain. He had a few falls last week due to anemia. Xray showed L4 compression fracture. Ortho consulted and reviewed with spine specialist. No intervention or brace required but he should avoid heavy lifting and take pain medications as needed. He was able to ambulate with pain controlled by percocet. He is being discharged with percocet as needed.   From HF/LVAD perspective he remained stable. Ramp ECHO performed on 4/19 with fixed speed to remain at 9200. INR followed closely with HF pharmacist. Minor adjustments made to coumadin regimen as his INR was running a little higher than his goal.   He will continue to be followed closely in LVAD clinic and has follow up next week.    LVAD INTERROGATION:  HeartMate II LVAD: Flow 4.7 liters/min, speed 9200, power 5.4, PI 5.8 Rare PI events.   Discharge Weight Range:  196 pounds  Discharge Vitals: Pulse 70, temperature 97.9 F (36.6 C), temperature source Oral, resp. rate 16, height 6' (1.829 m),  weight 196 lb 13.9 oz (89.3 kg), SpO2 93 %.  Labs: Lab Results  Component Value Date   WBC 5.7 02/20/2015   HGB 9.9* 02/20/2015   HCT 30.9* 02/20/2015   MCV 93.9 02/20/2015   PLT 207 02/20/2015     Recent Labs Lab 02/20/15 2015  NA 134*  K 4.0  CL 103  CO2 24  BUN 18  CREATININE 1.43*  CALCIUM 8.2*  GLUCOSE 115*   Lab Results  Component Value Date   CHOL 118 11/28/2012   HDL 50.80 11/28/2012   LDLCALC 58 11/28/2012   TRIG 48.0 11/28/2012   BNP (last 3 results)  Recent Labs  11/25/14 1335  BNP 420.8*    ProBNP (last 3 results)  Recent Labs  09/09/14 1038 09/13/14 1024 10/14/14 0936  PROBNP 5067.0* 5152.0* 1880.0*     Diagnostic Studies/Procedures   Dg Lumbar Spine Complete  02/20/2015   CLINICAL DATA:  Centralized lower back pain for 4 days. Fall greater than 1 week ago. Two prior low back surgeries.  EXAM: LUMBAR SPINE - COMPLETE 4+ VIEW  COMPARISON:  Abdominal radiograph 09/29/2014. CT abdomen and pelvis 09/09/2014.  FINDINGS: There are 5 non rib-bearing lumbar type vertebral bodies. Slight left convex curvature of the lumbar spine may be positional or reflect minimal scoliosis. There is a mild L4 superior endplate compression fracture which is new from the prior CT and most likely relatively acute. There is no listhesis. Mild disc space narrowing is noted at L4-5. Abdominal surgical clips are noted, in a left ventricular assist  device is partially visualized.  IMPRESSION: Mild L4 compression fracture, likely acute.   Electronically Signed   By: Logan Bores   On: 02/20/2015 19:18    Discharge Medications     Medication List    TAKE these medications        acetaminophen 325 MG tablet  Commonly known as:  TYLENOL  Take 650 mg by mouth every 6 (six) hours as needed (pain).     amiodarone 200 MG tablet  Commonly known as:  PACERONE  Take 1 tablet (200 mg total) by mouth 2 (two) times daily.     amLODipine 10 MG tablet  Commonly known as:  NORVASC   Take 10 mg by mouth daily.     atorvastatin 80 MG tablet  Commonly known as:  LIPITOR  Take 80 mg by mouth daily.     docusate sodium 100 MG capsule  Commonly known as:  COLACE  Take 200 mg by mouth daily.     furosemide 20 MG tablet  Commonly known as:  LASIX  Take 20 mg by mouth daily as needed for fluid.     ICAPS PO  Take 1 tablet by mouth 2 (two) times daily.     levothyroxine 175 MCG tablet  Commonly known as:  SYNTHROID, LEVOTHROID  Take 1 tablet (175 mcg total) by mouth daily before breakfast.     magnesium gluconate 500 MG tablet  Commonly known as:  MAGONATE  Take 1,000 mg by mouth at bedtime.     multivitamin with minerals Tabs tablet  Take 1 tablet by mouth at bedtime.     nitroGLYCERIN 0.4 MG SL tablet  Commonly known as:  NITROSTAT  Place 0.4 mg under the tongue every 5 (five) minutes as needed for chest pain.     oxyCODONE-acetaminophen 5-325 MG per tablet  Commonly known as:  PERCOCET/ROXICET  Take 1-2 tablets by mouth every 4 (four) hours as needed for moderate pain.     pantoprazole 40 MG tablet  Commonly known as:  PROTONIX  Take 1 tablet (40 mg total) by mouth 2 (two) times daily.     promethazine 12.5 MG tablet  Commonly known as:  PHENERGAN  Take 1 tablet (12.5 mg total) by mouth every 6 (six) hours as needed for nausea or vomiting.     RANEXA 500 MG 12 hr tablet  Generic drug:  ranolazine  TAKE 1 TABLET (500 MG TOTAL) BY MOUTH 2 (TWO) TIMES DAILY.     warfarin 2 MG tablet  Commonly known as:  COUMADIN  Take 2mg  daily, except takes 4mg  on Monday and Friday.     zinc gluconate 50 MG tablet  Take 100 mg by mouth at bedtime.        Disposition   The patient will be discharged in stable condition to home. Discharge Instructions    Contraindication to ACEI at discharge    Complete by:  As directed      Diet - low sodium heart healthy    Complete by:  As directed      Heart Failure patients record your daily weight using the same  scale at the same time of day    Complete by:  As directed      Increase activity slowly    Complete by:  As directed           Follow-up Information    Follow up with Mcarthur Rossetti, MD. Schedule an appointment as soon as possible for a visit  in 2 weeks.   Specialty:  Orthopedic Surgery   Contact information:   [place] [address] [phone]       Follow up with Glori Bickers, MD On 03/03/2015.   Specialty:  Cardiology   Why:  at 11:00   Contact information:   [place] [address] [phone]         Duration of Discharge Encounter: Greater than 35 minutes   Signed, CLEGG,AMY  NP-C  02/22/2015, 3:20 PM

## 2015-02-22 NOTE — Progress Notes (Signed)
ANTICOAGULATION CONSULT NOTE - [place] for coumadin Indication: atrial fibrillation  Allergies  Allergen Reactions  . Demerol [Meperidine] Other (See Comments)    Paralysis. Could only move eyes.    Patient Measurements: Height: 6' (182.9 cm) Weight: 196 lb 13.9 oz (89.3 kg) IBW/kg (Calculated) : 77.6 Heparin Dosing Weight:   Vital Signs: Temp: 98 F (36.7 C) (04/19 0415) Temp Source: Oral (04/19 0415) Pulse Rate: 70 (04/19 0415)  Labs:  Recent Labs  02/20/15 2015 02/21/15 0332 02/22/15 0545  HGB 9.9*  --   --   HCT 30.9*  --   --   PLT 207  --   --   LABPROT 30.0* 30.3* 27.1*  INR 2.84* 2.87* 2.49*  CREATININE 1.43*  --   --     Estimated Creatinine Clearance: 49 mL/min (by C-G formula based on Cr of 1.43).   Medical History: Past Medical History  Diagnosis Date  . Bradycardia   . Ventricular tachycardia     s/p AICD;   h/o ICD shock;  Amiodarone Rx. S/P ablation in 2012  . 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 HTN (hypertension)   . HLD (hyperlipidemia)   . Cytopenia   . Hypothyroidism   . Renal insufficiency   . Claudication   . Chronic systolic heart failure   . Ischemic cardiomyopathy     echo 7/12:  EF 25-30%, mild AI, mod MR, mod LAE  . TIA (transient ischemic attack) 2001  . CVD (cerebrovascular disease)     left CEA 2008  . Stroke   . Anemia   . Inappropriate therapy from implantable cardioverter-defibrillator 04/02/2013    ATP for sinus tach  SVT wavelet identified SVT but was no  passive   . Myocardial infarction 1992  . Anginal pain   . Automatic implantable cardioverter-defibrillator in situ     [employer] Evera device serial number [id number]    . Arthritis     "a little in my knees" (05/25/2013)  . Melanoma of ear 2013    "near left ear" (05/25/2013)  . 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 Dysrhythmia     HX A FIB  . History of nonmelanoma skin cancer     L EAR  . Kidney stone   . History of transfusion   . Sleep apnea     denies wearing mask (05/25/2013)  . CAD (coronary artery disease)     s/p MI 1982;  s/p DES to CFX 2011;  s/p NSTEMI 4/12 in setting of VTach;  cath 7/12:  LM ok, LAD mid 30-40%, Dx's with 40%, mCFX stent patent, prox to mid RCA occluded with R to R and L to R collats.  Medical Tx was continued  . CHF (congestive heart failure)     HAS VENTRICULAR PUMP    Medications:  Scheduled:  . amiodarone  200 mg Oral BID  . amLODipine  10 mg Oral QPM  . atorvastatin  80 mg Oral BH-q7a  . docusate sodium  200 mg Oral BH-q7a  . levothyroxine  175 mcg Oral QAC breakfast  . magnesium gluconate  1,000 mg Oral Daily  . multivitamin-lutein   Oral Daily  . pantoprazole  40 mg Oral BID  . ranolazine  500 mg Oral  BID  . warfarin  2 mg Oral ONCE-1800  . Warfarin - Pharmacist Dosing Inpatient   Does not apply q1800    Assessment: 74 yo with MMP who was admitted for compression fractures. Pt also has an LVAD. He has been on coumadin for anticoagulation for afib/CVA. Coumadin has been ordered to be continued here. INR back within modified goal of 2-2.5. No bleeding or complications noted.  Goal of Therapy:  INR goal 2-2.5 Monitor platelets by anticoagulation protocol: Yes   Plan:  Coumadin 2 mg x 1 tonight, recommend 2 mg daily at discharge except 4 mg on Mon, Fri. Daily INR  [place] Crane, Vena [place], BCPS  Clinical Pharmacist Pager [phone]  02/22/2015 8:35 AM

## 2015-02-22 NOTE — Progress Notes (Addendum)
HeartMate 2 Rounding Note  Subjective:   Admitted with low back pain that was from L4 compression fracture. Evaluated by Dr Ninfa Linden in [place].   Pain better controlled. Denies SOB.  Ultram just made him sick.   INR 2.4.   LVAD INTERROGATION:  HeartMate II LVAD:  Flow 4.7 liters/min, speed 9200, power 5.4, PI 5.8  Rare PI events.   Objective:    Vital Signs:   Temp:  [97.6 F (36.4 C)-98 F (36.7 C)] 98 F (36.7 C) (04/19 0415) Pulse Rate:  [70] 70 (04/19 0415) Resp:  [18] 18 (04/19 0415) SpO2:  [96 %] 96 % (04/19 0415) Weight:  [196 lb 13.9 oz (89.3 kg)] 196 lb 13.9 oz (89.3 kg) (04/19 0415) Last BM Date: 02/21/15 Mean arterial Pressure 76   Intake/Output:   Intake/Output Summary (Last 24 hours) at 02/22/15 0829 Last data filed at 02/22/15 0001  Gross per 24 hour  Intake   1040 ml  Output    500 ml  Net    540 ml     Physical Exam: General:  Well appearing. No resp difficulty HEENT: normal Neck: supple. JVP 5-6  . Carotids 2+ bilat; no bruits. No lymphadenopathy or thryomegaly appreciated. Cor: Mechanical heart sounds with LVAD hum present. Lungs: clear Abdomen: soft, nontender, nondistended. No hepatosplenomegaly. No bruits or masses. Good bowel sounds. Driveline: C/D/I; securement device intact and driveline incorporated Extremities: no cyanosis, clubbing, rash, edema Neuro: alert & orientedx3, cranial nerves grossly intact. moves all 4 extremities w/o difficulty. Affect pleasant  Telemetry: NSR  Labs: Basic Metabolic Panel:  Recent Labs Lab 02/15/15 0935 02/16/15 0315 02/17/15 0428 02/20/15 2015  NA 139 138 137 134*  K 4.3 4.3 4.7 4.0  CL 107 105 104 103  CO2 25 24 25 24   GLUCOSE 106* 99 108* 115*  BUN 20 19 17 18   CREATININE 1.33 1.38* 1.32 1.43*  CALCIUM 8.3* 8.0* 8.3* 8.2*    Liver Function Tests: No results for input(s): AST, ALT, ALKPHOS, BILITOT, PROT, ALBUMIN in the last 168 hours. No results for input(s): LIPASE, AMYLASE in the last 168  hours. No results for input(s): AMMONIA in the last 168 hours.  CBC:  Recent Labs Lab 02/15/15 0935 02/16/15 0315 02/17/15 0428 02/20/15 2015  WBC 5.7 4.9 4.4 5.7  NEUTROABS  --   --   --  3.4  HGB 9.4* 8.8* 9.2* 9.9*  HCT 28.7* 27.2* 28.6* 30.9*  MCV 93.2 95.1 94.1 93.9  PLT 149* 134* 140* 207    INR:  Recent Labs Lab 02/16/15 0315 02/17/15 0428 02/20/15 2015 02/21/15 0332 02/22/15 0545  INR 1.94* 1.89* 2.84* 2.87* 2.49*    Other results:    Imaging: Dg Lumbar Spine Complete  02/20/2015   CLINICAL DATA:  Centralized lower back pain for 4 days. Fall greater than 1 week ago. Two prior low back surgeries.  EXAM: LUMBAR SPINE - COMPLETE 4+ VIEW  COMPARISON:  Abdominal radiograph 09/29/2014. CT abdomen and pelvis 09/09/2014.  FINDINGS: There are 5 non rib-bearing lumbar type vertebral bodies. Slight left convex curvature of the lumbar spine may be positional or reflect minimal scoliosis. There is a mild L4 superior endplate compression fracture which is new from the prior CT and most likely relatively acute. There is no listhesis. Mild disc space narrowing is noted at L4-5. Abdominal surgical clips are noted, in a left ventricular assist device is partially visualized.  IMPRESSION: Mild L4 compression fracture, likely acute.   Electronically Signed   By: Zenia Resides  Jeralyn Ruths   On: 02/20/2015 19:18     Medications:     Scheduled Medications: . amiodarone  200 mg Oral BID  . amLODipine  10 mg Oral QPM  . atorvastatin  80 mg Oral BH-q7a  . docusate sodium  200 mg Oral BH-q7a  . levothyroxine  175 mcg Oral QAC breakfast  . magnesium gluconate  1,000 mg Oral Daily  . multivitamin-lutein   Oral Daily  . pantoprazole  40 mg Oral BID  . ranolazine  500 mg Oral BID  . Warfarin - Pharmacist Dosing Inpatient   Does not apply q1800    Infusions:    PRN Medications: acetaminophen **OR** acetaminophen, furosemide, nitroGLYCERIN, oxyCODONE-acetaminophen, promethazine,  traMADol   Assessment:  1. Lumbar Compression Fracture  2.. 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 Anemia - S/P EGD with 2 AVMs clipped in jejunum. 3. CAD 4.  Paroxysmal atrial fibrillation 5.  H/O 09/2015 Nephrolithiasis with J stent 6. Chronic Anticoagulation - on coumadin - Goal for INR 2-2.5    Plan/Discussion:   Appreciate Dr Adela Ports input. He can be up with ambulation. No need for back brace.     Continue protonix. No aspirin with recent bleed. INR today 2.4. Pharmacy following for coumadin  From HF perspective he is stable. Continue current regimen. Plan Ramp ECHO today then home.    I reviewed the LVAD parameters from today, and compared the results to the patient's prior recorded data.  No programming changes were made.  The LVAD is functioning within specified parameters.  The patient performs LVAD self-test daily.  LVAD interrogation was negative for any significant power changes, alarms or PI events/speed drops.  LVAD equipment check completed and is in good working order.  Back-up equipment present.   LVAD education done on emergency procedures and precautions and reviewed exit site care.  Length of Stay: 2  CLEGG,AMY NP-C  02/22/2015, 8:29 AM  VAD Team --- VAD ISSUES ONLY--- Pager [phone] (7am - 7am)  Advanced Heart Failure Team  Pager [phone] (M-F; 7a - 4p)  Please contact [facility] for night-coverage after hours (4p -7a ) and weekends on [email or web address]  Patient seen and examined with Darrick Grinder, NP. We discussed all aspects of the encounter. I agree with the assessment and plan as stated above.   Back pain improved. Doesn't tolerate Ultram well. Use percocet. No NSAIDs with recent GIB. For Ramp Echo today then home.   Cherre Kothari,MD 8:46 AM

## 2015-02-23 ENCOUNTER — Other Ambulatory Visit (HOSPITAL_COMMUNITY): Payer: Self-pay | Admitting: *Deleted

## 2015-02-23 MED ORDER — PANTOPRAZOLE SODIUM 40 MG PO TBEC: 40.0000 mg | DELAYED_RELEASE_TABLET | Freq: Every day | ORAL | Status: DC

## 2015-02-24 ENCOUNTER — Encounter (HOSPITAL_COMMUNITY): Payer: Medicare Other

## 2015-02-24 ENCOUNTER — Other Ambulatory Visit (HOSPITAL_COMMUNITY): Payer: Self-pay | Admitting: *Deleted

## 2015-02-24 ENCOUNTER — Ambulatory Visit (HOSPITAL_COMMUNITY): Payer: Medicare Other

## 2015-02-24 DIAGNOSIS — Q2739 Arteriovenous malformation, other site: Secondary | ICD-10-CM

## 2015-02-24 MED ORDER — PANTOPRAZOLE SODIUM 40 MG PO TBEC: 40.0000 mg | DELAYED_RELEASE_TABLET | Freq: Two times a day (BID) | ORAL | Status: DC

## 2015-02-25 ENCOUNTER — Ambulatory Visit (HOSPITAL_COMMUNITY): Payer: Medicare Other

## 2015-02-25 ENCOUNTER — Encounter (HOSPITAL_COMMUNITY): Payer: Medicare Other

## 2015-02-28 ENCOUNTER — Telehealth (HOSPITAL_COMMUNITY): Payer: Self-pay | Admitting: *Deleted

## 2015-02-28 ENCOUNTER — Encounter (HOSPITAL_COMMUNITY): Payer: Self-pay | Admitting: Family Medicine

## 2015-02-28 ENCOUNTER — Inpatient Hospital Stay (HOSPITAL_COMMUNITY)
Admission: EM | Admit: 2015-02-28 | Discharge: 2015-03-05 | DRG: 309 | Disposition: A | Discharge status: Home / Self Care | Payer: Medicare Other | Attending: Internal Medicine | Admitting: Internal Medicine

## 2015-02-28 DIAGNOSIS — Z961 Presence of intraocular lens: Secondary | ICD-10-CM | POA: Diagnosis present

## 2015-02-28 DIAGNOSIS — I480 Paroxysmal atrial fibrillation: Secondary | ICD-10-CM | POA: Diagnosis present

## 2015-02-28 DIAGNOSIS — M4856XA Collapsed vertebra, not elsewhere classified, lumbar region, initial encounter for fracture: Secondary | ICD-10-CM | POA: Diagnosis present

## 2015-02-28 DIAGNOSIS — Z9049 Acquired absence of other specified parts of digestive tract: Secondary | ICD-10-CM | POA: Diagnosis present

## 2015-02-28 DIAGNOSIS — Z86718 Personal history of other venous thrombosis and embolism: Secondary | ICD-10-CM

## 2015-02-28 DIAGNOSIS — Z7901 Long term (current) use of anticoagulants: Secondary | ICD-10-CM

## 2015-02-28 DIAGNOSIS — M545 Low back pain: Secondary | ICD-10-CM | POA: Diagnosis not present

## 2015-02-28 DIAGNOSIS — Z95810 Presence of automatic (implantable) cardiac defibrillator: Secondary | ICD-10-CM | POA: Diagnosis not present

## 2015-02-28 DIAGNOSIS — R531 Weakness: Secondary | ICD-10-CM

## 2015-02-28 DIAGNOSIS — E785 Hyperlipidemia, unspecified: Secondary | ICD-10-CM | POA: Diagnosis present

## 2015-02-28 DIAGNOSIS — I5022 Chronic systolic (congestive) heart failure: Secondary | ICD-10-CM | POA: Diagnosis present

## 2015-02-28 DIAGNOSIS — M1990 Unspecified osteoarthritis, unspecified site: Secondary | ICD-10-CM | POA: Diagnosis present

## 2015-02-28 DIAGNOSIS — Z9842 Cataract extraction status, left eye: Secondary | ICD-10-CM | POA: Diagnosis not present

## 2015-02-28 DIAGNOSIS — I471 Supraventricular tachycardia: Secondary | ICD-10-CM | POA: Diagnosis present

## 2015-02-28 DIAGNOSIS — Z9841 Cataract extraction status, right eye: Secondary | ICD-10-CM | POA: Diagnosis not present

## 2015-02-28 DIAGNOSIS — G4730 Sleep apnea, unspecified: Secondary | ICD-10-CM | POA: Diagnosis present

## 2015-02-28 DIAGNOSIS — Z8673 Personal history of transient ischemic attack (TIA), and cerebral infarction without residual deficits: Secondary | ICD-10-CM | POA: Diagnosis not present

## 2015-02-28 DIAGNOSIS — Z87891 Personal history of nicotine dependence: Secondary | ICD-10-CM

## 2015-02-28 DIAGNOSIS — R55 Syncope and collapse: Secondary | ICD-10-CM | POA: Diagnosis present

## 2015-02-28 DIAGNOSIS — R112 Nausea with vomiting, unspecified: Secondary | ICD-10-CM | POA: Diagnosis present

## 2015-02-28 DIAGNOSIS — Z9181 History of falling: Secondary | ICD-10-CM | POA: Diagnosis not present

## 2015-02-28 DIAGNOSIS — E039 Hypothyroidism, unspecified: Secondary | ICD-10-CM | POA: Diagnosis present

## 2015-02-28 DIAGNOSIS — I255 Ischemic cardiomyopathy: Secondary | ICD-10-CM | POA: Diagnosis present

## 2015-02-28 DIAGNOSIS — I10 Essential (primary) hypertension: Secondary | ICD-10-CM | POA: Diagnosis present

## 2015-02-28 DIAGNOSIS — I4719 Other supraventricular tachycardia: Secondary | ICD-10-CM | POA: Insufficient documentation

## 2015-02-28 DIAGNOSIS — S32040A Wedge compression fracture of fourth lumbar vertebra, initial encounter for closed fracture: Secondary | ICD-10-CM

## 2015-02-28 DIAGNOSIS — I739 Peripheral vascular disease, unspecified: Secondary | ICD-10-CM | POA: Diagnosis present

## 2015-02-28 DIAGNOSIS — I252 Old myocardial infarction: Secondary | ICD-10-CM

## 2015-02-28 DIAGNOSIS — I2510 Atherosclerotic heart disease of native coronary artery without angina pectoris: Secondary | ICD-10-CM | POA: Diagnosis present

## 2015-02-28 DIAGNOSIS — Z95811 Presence of heart assist device: Secondary | ICD-10-CM

## 2015-02-28 DIAGNOSIS — Z87442 Personal history of urinary calculi: Secondary | ICD-10-CM

## 2015-02-28 DIAGNOSIS — Z888 Allergy status to other drugs, medicaments and biological substances status: Secondary | ICD-10-CM | POA: Diagnosis not present

## 2015-02-28 DIAGNOSIS — I340 Nonrheumatic mitral (valve) insufficiency: Secondary | ICD-10-CM | POA: Diagnosis not present

## 2015-02-28 DIAGNOSIS — I4891 Unspecified atrial fibrillation: Secondary | ICD-10-CM | POA: Diagnosis not present

## 2015-02-28 LAB — BASIC METABOLIC PANEL
ANION GAP: 12 (ref 5–15)
BUN: 19 mg/dL (ref 6–23)
CALCIUM: 9 mg/dL (ref 8.4–10.5)
CO2: 21 mmol/L (ref 19–32)
Chloride: 102 mmol/L (ref 96–112)
Creatinine, Ser: 1.51 mg/dL — ABNORMAL HIGH (ref 0.50–1.35)
GFR calc Af Amer: 50 mL/min — ABNORMAL LOW (ref >90)
GFR, EST NON AFRICAN AMERICAN: 43 mL/min — AB (ref >90)
GLUCOSE: 110 mg/dL — AB (ref 70–99)
POTASSIUM: 4.9 mmol/L (ref 3.5–5.1)
Sodium: 135 mmol/L (ref 135–145)

## 2015-02-28 LAB — CBC
HCT: 38.5 % — ABNORMAL LOW (ref 39.0–52.0)
Hemoglobin: 12.3 g/dL — ABNORMAL LOW (ref 13.0–17.0)
MCH: 30.2 pg (ref 26.0–34.0)
MCHC: 31.9 g/dL (ref 30.0–36.0)
MCV: 94.6 fL (ref 78.0–100.0)
Platelets: 276 10*3/uL (ref 150–400)
RBC: 4.07 MIL/uL — ABNORMAL LOW (ref 4.22–5.81)
RDW: 19.4 % — AB (ref 11.5–15.5)
WBC: 8 10*3/uL (ref 4.0–10.5)

## 2015-02-28 LAB — LACTATE DEHYDROGENASE: LDH: 372 U/L — ABNORMAL HIGH (ref 94–250)

## 2015-02-28 LAB — TYPE AND SCREEN
ABO/RH(D): O POS
Antibody Screen: NEGATIVE

## 2015-02-28 LAB — MRSA PCR SCREENING: MRSA BY PCR: NEGATIVE

## 2015-02-28 LAB — PROTIME-INR
INR: 1.83 — AB (ref 0.00–1.49)
Prothrombin Time: 21.3 seconds — ABNORMAL HIGH (ref 11.6–15.2)

## 2015-02-28 LAB — I-STAT TROPONIN, ED: Troponin i, poc: 0.01 ng/mL (ref 0.00–0.08)

## 2015-02-28 MED ORDER — AMIODARONE HCL 200 MG PO TABS
200.0000 mg | ORAL_TABLET | Freq: Two times a day (BID) | ORAL | Status: DC
  Administered 2015-02-28: 200 mg via ORAL
  Filled 2015-02-28 (×3): qty 1

## 2015-02-28 MED ORDER — ADULT MULTIVITAMIN W/MINERALS CH
1.0000 | ORAL_TABLET | Freq: Every day | ORAL | Status: DC
  Administered 2015-02-28 – 2015-03-04 (×5): 1 via ORAL
  Filled 2015-02-28 (×6): qty 1

## 2015-02-28 MED ORDER — PROMETHAZINE HCL 25 MG PO TABS: 12.5000 mg | ORAL_TABLET | Freq: Four times a day (QID) | ORAL | Status: DC | PRN

## 2015-02-28 MED ORDER — WARFARIN - PHARMACIST DOSING INPATIENT: Freq: Every day | Status: DC

## 2015-02-28 MED ORDER — ONDANSETRON HCL 4 MG/2ML IJ SOLN
4.0000 mg | INTRAMUSCULAR | Status: DC | PRN
  Administered 2015-03-01 – 2015-03-05 (×2): 4 mg via INTRAVENOUS
  Filled 2015-02-28 (×2): qty 2

## 2015-02-28 MED ORDER — WARFARIN SODIUM 5 MG PO TABS
5.0000 mg | ORAL_TABLET | Freq: Once | ORAL | Status: DC
  Filled 2015-02-28 (×2): qty 1

## 2015-02-28 MED ORDER — OXYCODONE-ACETAMINOPHEN 5-325 MG PO TABS
2.0000 | ORAL_TABLET | ORAL | Status: DC | PRN
  Administered 2015-02-28: 2 via ORAL
  Filled 2015-02-28: qty 2

## 2015-02-28 MED ORDER — DOCUSATE SODIUM 100 MG PO CAPS
200.0000 mg | ORAL_CAPSULE | Freq: Every day | ORAL | Status: DC
  Administered 2015-03-01 – 2015-03-05 (×5): 200 mg via ORAL
  Filled 2015-02-28 (×5): qty 2

## 2015-02-28 MED ORDER — MAGNESIUM GLUCONATE 500 MG PO TABS
1000.0000 mg | ORAL_TABLET | Freq: Every day | ORAL | Status: DC
  Administered 2015-02-28 – 2015-03-04 (×5): 1000 mg via ORAL
  Filled 2015-02-28 (×7): qty 2

## 2015-02-28 MED ORDER — RANOLAZINE ER 500 MG PO TB12
500.0000 mg | ORAL_TABLET | Freq: Two times a day (BID) | ORAL | Status: DC
  Administered 2015-02-28 – 2015-03-05 (×10): 500 mg via ORAL
  Filled 2015-02-28 (×11): qty 1

## 2015-02-28 MED ORDER — AMLODIPINE BESYLATE 10 MG PO TABS
10.0000 mg | ORAL_TABLET | Freq: Every day | ORAL | Status: DC
  Administered 2015-03-01 – 2015-03-05 (×5): 10 mg via ORAL
  Filled 2015-02-28 (×5): qty 1

## 2015-02-28 MED ORDER — PANTOPRAZOLE SODIUM 40 MG PO TBEC
40.0000 mg | DELAYED_RELEASE_TABLET | Freq: Two times a day (BID) | ORAL | Status: DC
  Administered 2015-02-28 – 2015-03-05 (×10): 40 mg via ORAL
  Filled 2015-02-28 (×10): qty 1

## 2015-02-28 MED ORDER — OXYCODONE-ACETAMINOPHEN 5-325 MG PO TABS
1.0000 | ORAL_TABLET | ORAL | Status: DC | PRN
  Administered 2015-02-28 – 2015-03-01 (×2): 2 via ORAL
  Administered 2015-03-01: 1 via ORAL
  Administered 2015-03-01 (×3): 2 via ORAL
  Administered 2015-03-02: 1 via ORAL
  Administered 2015-03-04 (×2): 2 via ORAL
  Filled 2015-02-28 (×2): qty 2
  Filled 2015-02-28: qty 1
  Filled 2015-02-28 (×2): qty 2
  Filled 2015-02-28: qty 1
  Filled 2015-02-28 (×3): qty 2

## 2015-02-28 MED ORDER — LEVOTHYROXINE SODIUM 175 MCG PO TABS
175.0000 ug | ORAL_TABLET | Freq: Every day | ORAL | Status: DC
  Administered 2015-03-01 – 2015-03-02 (×2): 175 ug via ORAL
  Filled 2015-02-28 (×3): qty 1

## 2015-02-28 MED ORDER — ATORVASTATIN CALCIUM 80 MG PO TABS
80.0000 mg | ORAL_TABLET | Freq: Every day | ORAL | Status: DC
  Administered 2015-03-01 – 2015-03-05 (×5): 80 mg via ORAL
  Filled 2015-02-28 (×5): qty 1

## 2015-02-28 MED ORDER — MAGNESIUM HYDROXIDE 400 MG/5ML PO SUSP: 30.0000 mL | Freq: Every day | ORAL | Status: DC | PRN

## 2015-02-28 MED ORDER — ACETAMINOPHEN 325 MG PO TABS
650.0000 mg | ORAL_TABLET | Freq: Four times a day (QID) | ORAL | Status: DC | PRN
  Administered 2015-03-03: 650 mg via ORAL
  Filled 2015-02-28 (×2): qty 2

## 2015-02-28 NOTE — ED Provider Notes (Signed)
CSN: [id number]     Arrival date & time 02/28/15  1223 History   First MD Initiated Contact with Patient 02/28/15 1254     Chief Complaint  Patient presents with  . Weakness  . Dizziness   Patient is a 74 y.o. male presenting with weakness. The history is provided by the patient.  Weakness This is a new problem. The current episode started 6 to 12 hours ago. The problem occurs constantly. The problem has not changed since onset.Pertinent negatives include no chest pain and no abdominal pain. The symptoms are aggravated by walking. The symptoms are relieved by rest.  Pt reports increasing fatigue starting earlier today No CP is reported   Past Medical History  Diagnosis Date  . Bradycardia   . Ventricular tachycardia     s/p AICD;   h/o ICD shock;  Amiodarone Rx. S/P ablation in 2012  . 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 HTN (hypertension)   . HLD (hyperlipidemia)   . Cytopenia   . Hypothyroidism   . Renal insufficiency   . Claudication   . Chronic systolic heart failure   . Ischemic cardiomyopathy     echo 7/12:  EF 25-30%, mild AI, mod MR, mod LAE  . TIA (transient ischemic attack) 2001  . CVD (cerebrovascular disease)     left CEA 2008  . Stroke   . Anemia   . Inappropriate therapy from implantable cardioverter-defibrillator 04/02/2013    ATP for sinus tach  SVT wavelet identified SVT but was no  passive   . Myocardial infarction 1992  . Anginal pain   . Automatic implantable cardioverter-defibrillator in situ     [employer]tronic Evera device serial number [id number]    . Arthritis     "a little in my knees" (05/25/2013)  . Melanoma of ear 2013    "near left ear" (05/25/2013)  . 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 Dysrhythmia     HX A FIB  . History of nonmelanoma skin cancer     L EAR  . Kidney stone   . History of transfusion   .  Sleep apnea     denies wearing mask (05/25/2013)  . CAD (coronary artery disease)     s/p MI 1982;  s/p DES to CFX 2011;  s/p NSTEMI 4/12 in setting of VTach;  cath 7/12:  LM ok, LAD mid 30-40%, Dx's with 40%, mCFX stent patent, prox to mid RCA occluded with R to R and L to R collats.  Medical Tx was continued  . CHF (congestive heart failure)     HAS VENTRICULAR PUMP   Past Surgical History  Procedure Laterality Date  . Femoral-popliteal bypass graft Right 2011  . Cardiac defibrillator placement  2003; 2005; 05/25/2013    2014: [employer] Evera device serial number [id number]  . Revascularization / in-situ graft leg    . Renal artery bypass Bilateral 2003  . Back surgery    . Lumbar disc surgery  1974; 2000's  . Knee arthroscopy Bilateral 1990's  . Elbow arthroscopy Right 1990's  . Cholecystectomy  12/15/?2010  . Lipoma excision Left 2013    "near ear" (05/25/2013)  . Heel spur surgery Right 1990's  . Cataract extraction w/ intraocular lens  implant, bilateral Bilateral 2000  .  Carotid endarterectomy Left ~ 2007  . Doppler echocardiography  2011  . Tonsillectomy  1946  . Femoral-popliteal bypass graft Left 05/2002    Archie Endo 05/06/2002 (05/25/2013)  . Tumor excision Left 1960's    "fatty tumor" (05/25/2013)  . Cardiac electrophysiology study and ablation  2012  . Coronary angioplasty  1992  . Cardiac catheterization      "several" (05/25/2013)  . Coronary angioplasty with stent placement      "last one was 11/2012  (05/25/2013)  . Cardioversion N/A 10/15/2013    Procedure: CARDIOVERSION;  Surgeon: Jolaine Artist, MD;  Location: [place];  Service: Cardiovascular;  Laterality: N/A;  . Insertion of implantable left ventricular assist device N/A 10/19/2013    Procedure: INSERTION OF IMPLANTABLE LEFT VENTRICULAR ASSIST DEVICE;  Surgeon: Gaye Pollack, MD;  Location: [place];  Service: Open Heart Surgery;  Laterality: N/A;  CIR[facility] ARREST  NITRIC OXIDE  [employer] ICD  . Intraoperative  transesophageal echocardiogram N/A 10/19/2013    Procedure: INTRAOPERATIVE TRANSESOPHAGEAL ECHOCARDIOGRAM;  Surgeon: Gaye Pollack, MD;  Location: [facility] OR;  Service: Open Heart Surgery;  Laterality: N/A;  . Tee without cardioversion N/A 11/04/2013    Procedure: TRANSESOPHAGEAL ECHOCARDIOGRAM (TEE);  Surgeon: Larey Dresser, MD;  Location: [place];  Service: Cardiovascular;  Laterality: N/A;  . Cardioversion N/A 11/04/2013    Procedure: CARDIOVERSION;  Surgeon: Larey Dresser, MD;  Location: [place];  Service: Cardiovascular;  Laterality: N/A;  . Cardioversion N/A 06/04/2014    Procedure: CARDIOVERSION;  Surgeon: Jolaine Artist, MD;  Location: [facility] ENDOSCOPY;  Service: Cardiovascular;  Laterality: N/A;  . Cystoscopy with retrograde pyelogram, ureteroscopy and stent placement Left 08/09/2014    Procedure: [place], URETEROSCOPY AND STENT PLACEMENT;  Surgeon: Bernestine Amass, MD;  Location: [facility] ORS;  Service: Urology;  Laterality: Left;  . Cystoscopy with retrograde pyelogram, ureteroscopy and stent placement Left 09/01/2014    Procedure: CYSTOSCOPY WITH URETEROSCOPY AND STENT REMOVAL;  Surgeon: Bernestine Amass, MD;  Location: [facility]S;  Service: Urology;  Laterality: Left;  . Holmium laser application Left 51/76/1607    Procedure: HOLMIUM LASER APPLICATION;  Surgeon: Bernestine Amass, MD;  Location: [facility];  Service: Urology;  Laterality: Left;  . Cystoscopy with retrograde pyelogram, ureteroscopy and stent placement Left 09/20/2014    Procedure: CYSTOSCOPY WITH RETROGRADE PYELOGRAM, URETEROSCOPY AND STENT PLACEMENT, stone removal;  Surgeon: Bernestine Amass, MD;  Location: [facility];  Service: Urology;  Laterality: Left;  Stephanie Coup ablation N/A 09/12/2011    Procedure: V-TACH ABLATION;  Surgeon: Evans Lance, MD;  Location: [facility] CATH LAB;  Service: Cardiovascular;  Laterality: N/A;  . Left heart catheterization with coronary angiogram N/A 11/24/2012    Procedure: LEFT HEART  CATHETERIZATION WITH CORONARY ANGIOGRAM;  Surgeon: Peter M Martinique, MD;  Location: [facility] CATH LAB;  Service: Cardiovascular;  Laterality: N/A;  . Implantable cardioverter defibrillator generator change N/A 05/25/2013    Procedure: IMPLANTABLE CARDIOVERTER DEFIBRILLATOR GENERATOR CHANGE;  Surgeon: Deboraha Sprang, MD;  Location: [facility] CATH LAB;  Service: Cardiovascular;  Laterality: N/A;  . Lead revision N/A 06/05/2013    Procedure: LEAD REVISION;  Surgeon: Thompson Grayer, MD;  Location: [facility] CATH LAB;  Service: Cardiovascular;  Laterality: N/A;  . V-tach ablation N/A 06/08/2013    Procedure: V-TACH ABLATION;  Surgeon: Evans Lance, MD;  Location: [place] CATH LAB;  Service: Cardiovascular;  Laterality: N/A;  . Right heart catheterization N/A 09/17/2013    Procedure: RIGHT HEART CATH;  Surgeon:  Jolaine Artist, MD;  Location: [facility] CATH LAB;  Service: Cardiovascular;  Laterality: N/A;  . Right heart catheterization N/A 10/14/2013    Procedure: RIGHT HEART CATH;  Surgeon: Jolaine Artist, MD;  Location: [facility] CATH LAB;  Service: Cardiovascular;  Laterality: N/A;  . Right heart catheterization N/A 11/16/2013    Procedure: RIGHT HEART CATH;  Surgeon: Jolaine Artist, MD;  Location: [facility] CATH LAB;  Service: Cardiovascular;  Laterality: N/A;  . Right heart catheterization N/A 07/13/2014    Procedure: RIGHT HEART CATH;  Surgeon: Jolaine Artist, MD;  Location: [facility] CATH LAB;  Service: Cardiovascular;  Laterality: N/A;  . Esophagogastroduodenoscopy N/A 02/15/2015    Procedure: ESOPHAGOGASTRODUODENOSCOPY (EGD);  Surgeon: Carol Ada, MD;  Location: [facility] ENDOSCOPY;  Service: Endoscopy;  Laterality: N/A;  LVAD   Family History  Problem Relation Age of Onset  . Coronary artery disease    . Heart attack Mother   . Coronary artery disease Mother   . Cancer Mother   . Heart disease Mother   . Hyperlipidemia Mother   . Hypertension Mother   . Coronary artery disease Father   . Stroke Father   . Cancer Father   . Heart  disease Father     before age 32  . Hyperlipidemia Father   . Hypertension Father   . Heart attack Father    History  Substance Use Topics  . Smoking status: Former Smoker -- 0.50 packs/day for 37 years    Types: Cigarettes    Quit date: 11/05/2001  . Smokeless tobacco: Never Used  . Alcohol Use: 0.0 oz/week     Comment: 05/25/2013 "mixed drink couple times/month"    Review of Systems  Constitutional: Positive for fatigue. Negative for fever.  Cardiovascular: Negative for chest pain.  Gastrointestinal: Negative for abdominal pain and blood in stool.  Neurological: Positive for dizziness and weakness. Negative for syncope.  All other systems reviewed and are negative.     Allergies  Demerol  Home Medications   Prior to Admission medications   Medication Sig Start Date End Date Taking? Authorizing Provider  acetaminophen (TYLENOL) 325 MG tablet Take 650 mg by mouth every 6 (six) hours as needed (pain).    Historical Provider, MD  amiodarone (PACERONE) 200 MG tablet Take 1 tablet (200 mg total) by mouth 2 (two) times daily. 11/30/14   Jolaine Artist, MD  amLODipine (NORVASC) 10 MG tablet Take 10 mg by mouth daily.    Historical Provider, MD  atorvastatin (LIPITOR) 80 MG tablet Take 80 mg by mouth daily.     Historical Provider, MD  docusate sodium (COLACE) 100 MG capsule Take 200 mg by mouth daily.     Historical Provider, MD  furosemide (LASIX) 20 MG tablet Take 20 mg by mouth daily as needed for fluid.     Historical Provider, MD  levothyroxine (SYNTHROID, LEVOTHROID) 175 MCG tablet Take 1 tablet (175 mcg total) by mouth daily before breakfast. 12/31/14   Deboraha Sprang, MD  magnesium gluconate (MAGONATE) 500 MG tablet Take 1,000 mg by mouth at bedtime.     Historical Provider, MD  Multiple Vitamin (MULTIVITAMIN WITH MINERALS) TABS tablet Take 1 tablet by mouth at bedtime.    Historical Provider, MD  Multiple Vitamins-Minerals (ICAPS PO) Take 1 tablet by mouth 2 (two) times  daily.    Historical Provider, MD  nitroGLYCERIN (NITROSTAT) 0.4 MG SL tablet Place 0.4 mg under the tongue every 5 (five) minutes as needed for chest pain.  Historical Provider, MD  oxyCODONE-acetaminophen (PERCOCET/ROXICET) 5-325 MG per tablet Take 1-2 tablets by mouth every 4 (four) hours as needed for moderate pain. 02/22/15   Amy D Clegg, NP  pantoprazole (PROTONIX) 40 MG tablet Take 1 tablet (40 mg total) by mouth 2 (two) times daily. 02/24/15   Amy D Ninfa Meeker, NP  promethazine (PHENERGAN) 12.5 MG tablet Take 1 tablet (12.5 mg total) by mouth every 6 (six) hours as needed for nausea or vomiting. 01/25/15   Larey Dresser, MD  RANEXA 500 MG 12 hr tablet TAKE 1 TABLET (500 MG TOTAL) BY MOUTH 2 (TWO) TIMES DAILY. 02/07/15   Larey Dresser, MD  warfarin (COUMADIN) 2 MG tablet Take 2mg  daily, except takes 4mg  on Monday and Friday. 02/22/15   Amy D Ninfa Meeker, NP  zinc gluconate 50 MG tablet Take 100 mg by mouth at bedtime.     Historical Provider, MD   BP   Pulse 69  Temp(Src) 97.7 F (36.5 C)  Resp 15  SpO2 98% Physical Exam CONSTITUTIONAL: Well developed/well nourished HEAD: Normocephalic/atraumatic EYES: EOMI, conjunctiva pink ENMT: Mucous membranes moist NECK: supple no meningeal signs SPINE/BACK: nontender CV: LVAD hum noted Pt has LVAD in place LUNGS: Lungs are clear to auscultation bilaterally, no apparent distress ABDOMEN: soft, nontender, no rebound or guarding NEURO: Pt is awake/alert/appropriate, moves all extremitiesx4.  No facial droop.  No arm/leg drift EXTREMITIES:full ROM SKIN: warm, color normal PSYCH: no abnormalities of mood noted, alert and oriented to situation  ED Course  Procedures   Pt here with increasing fatigue He is awake/alert, no distress He has LVAD in place and no obvious immediate complications Labs pending at this time He has been seen by heart failure team and will be admitted Pt agreeable with plan Labs Review Labs Reviewed  CBC - Abnormal;  Notable for the following:    RBC 4.07 (*)    Hemoglobin 12.3 (*)    HCT 38.5 (*)    RDW 19.4 (*)    All other components within normal limits  BASIC METABOLIC PANEL  LACTATE DEHYDROGENASE  PROTIME-INR  I-STAT TROPOININ, ED  TYPE AND SCREEN    Imaging Review No results found.   EKG Interpretation   Date/Time:  Monday February 28 2015 12:34:30 EDT Ventricular Rate:  71 PR Interval:  168 QRS Duration: 122 QT Interval:  452 QTC Calculation: 491 R Axis:   102 Text Interpretation:  Normal sinus rhythm Possible Lateral infarct , age  undetermined Abnormal ECG Confirmed by Christy Gentles  MD, Mylena Sedberry ([id number]) on  02/28/2015 12:44:36 PM      MDM   Final diagnoses:  Weakness    Nursing notes including past medical history and social history reviewed and considered in documentation Labs/vital reviewed myself and considered during evaluation     Ripley Fraise, MD 02/28/15 [phone]

## 2015-02-28 NOTE — ED Notes (Signed)
Attempted report x1. 

## 2015-02-28 NOTE — Progress Notes (Signed)
ANTICOAGULATION CONSULT NOTE - Initial Consult  Pharmacy Consult for coumadin Indication: LVAD  Allergies  Allergen Reactions  . Demerol [Meperidine] Other (See Comments)    Paralysis. Could only move eyes.    Patient Measurements:   Vital Signs: Temp: 97.7 F (36.5 C) (04/25 1235) Pulse Rate: 75 (04/25 1437)  Labs:  Recent Labs  02/28/15 1306  HGB 12.3*  HCT 38.5*  PLT 276  LABPROT 21.3*  INR 1.83*  CREATININE 1.51*    Estimated Creatinine Clearance: 46.4 mL/min (by C-G formula based on Cr of 1.51).   Medical History: Past Medical History  Diagnosis Date  . Bradycardia   . Ventricular tachycardia     s/p AICD;   h/o ICD shock;  Amiodarone Rx. S/P ablation in 2012  . 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 HTN (hypertension)   . HLD (hyperlipidemia)   . Cytopenia   . Hypothyroidism   . Renal insufficiency   . Claudication   . Chronic systolic heart failure   . Ischemic cardiomyopathy     echo 7/12:  EF 25-30%, mild AI, mod MR, mod LAE  . TIA (transient ischemic attack) 2001  . CVD (cerebrovascular disease)     left CEA 2008  . Stroke   . Anemia   . Inappropriate therapy from implantable cardioverter-defibrillator 04/02/2013    ATP for sinus tach  SVT wavelet identified SVT but was no  passive   . Myocardial infarction 1992  . Anginal pain   . Automatic implantable cardioverter-defibrillator in situ     Medtronic Evera device serial number [id number]    . Arthritis     "a little in my knees" (05/25/2013)  . Melanoma of ear 2013    "near left ear" (05/25/2013)  . 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 Dysrhythmia     HX A FIB  . History of nonmelanoma skin cancer     L EAR  . Kidney stone   . History of transfusion   . Sleep apnea     denies wearing mask (05/25/2013)  . CAD (coronary artery disease)     s/p MI 1982;   s/p DES to CFX 2011;  s/p NSTEMI 4/12 in setting of VTach;  cath 7/12:  LM ok, LAD mid 30-40%, Dx's with 40%, mCFX stent patent, prox to mid RCA occluded with R to R and L to R collats.  Medical Tx was continued  . CHF (congestive heart failure)     HAS VENTRICULAR PUMP    Assessment: 74 yo male with h/o VT, PAD and severe systolic HF (EF 13-08%) due to mixed ischemic/nonischemic cardiomyopathy. He is s/p HM II LVAD implant for destination therapy on October 19, 2013. On 09/01/14 underwent cystoscopy and flexible ureteroscopy with lithotripsy and basket extraction. 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 Had a fall in April that resulted in compression fracture to L4 requiring 48 hour hospital admit.   Today he presents to the ED with nausea, vomiting, poor appetite, and fatigue.  INR on admission is low at 1.8, patient has had frequent readmissions. Last INR check on 4/7 had a therapeutic INR on 4mg  on m/f and 2mg  other days.  Goal of Therapy:  INR goal 2-2.5 Monitor platelets by  anticoagulation protocol: Yes   Plan:  Warfarin 5mg  tonight Daily INR  Erin Hearing PharmD., BCPS Clinical Pharmacist Pager [phone] 02/28/2015 3:06 PM

## 2015-02-28 NOTE — ED Notes (Signed)
[employer] rep at bedside

## 2015-02-28 NOTE — ED Notes (Signed)
Pt aware that [employer] will come to evaluate for PVC count.

## 2015-02-28 NOTE — ED Notes (Signed)
pt here for weakness and dizziness that started this am. Pt here recently for the same. sts he stumbled this am. sts recent low hgb. Pt LVAD pt

## 2015-02-28 NOTE — ED Notes (Signed)
EKG complete at triage, given to Dr. Christy Gentles

## 2015-02-28 NOTE — H&P (Signed)
VAD TEAM History & Physical Note   Reason for Admission: Nausea/ Failure to Thrive    HPI:    Mr Sivils is a 74 yo male with h/o VT, PAD and severe systolic HF (EF 09-81%) due to mixed ischemic/nonischemic cardiomyopathy. He is s/p HM II LVAD implant for destination therapy on October 19, 2013. On 09/01/14 underwent cystoscopy and flexible ureteroscopy with lithotripsy and basket extraction. 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 Required several units PRBC transfusion. Had a fall in April that resulted in compression fracture to L4 requiring 48 hour hospital admit.   Today he presents to the ED with nausea, vomiting, poor appetite, and  fatigue. Feels awful. Denies SOB. Today he has had multiple episodes of presyncope. Over the last week his weight has gone down from 197 to 190 pounds. Poor PO intake. Has not had diuretics.  Complaining of back pain 9/10. Taking pain medication every 4 hours. Limited mobility due to ongoing back pain. Low PIs today. No BRBPR. Urine yellow.    LVAD INTERROGATION:  HeartMate II LVAD:  Flow 4.7 liters/min, speed 9200, power 3.6, PI 6.1   Multiple PI events  In ER, ICD interrogation showed recurrent atrial tach for last 24 hours. No VT. Volume status was up but now back down on Optivol. Multiple attempts at overdrive pacing in ER were unsuccessful.    Review of Systems: [y] = yes, [ ]  = no   General: Weight gain [ ] ; Weight loss [Y]; Anorexia [ ] ; Fatigue [ Y}; Fever [ ] ; Chills [ ] ; Weakness [ Y]  Cardiac: Chest pain/pressure [ ] ; Resting SOB [ ] ; Exertional SOB [ ] ; Orthopnea [ ] ; Pedal Edema [ ] ; Palpitations [ ] ; Syncope [ ] ; Presyncope [Y]; Paroxysmal nocturnal dyspnea[ ]   Pulmonary: Cough [ ] ; Wheezing[ ] ; Hemoptysis[ ] ; Sputum [ ] ; Snoring [ ]   GI: Vomiting[ Y]; Dysphagia[ ] ; Melena[ ] ; Hematochezia [ ] ; Heartburn[ ] ; Abdominal pain [ ] ; Constipation [ ] ; Diarrhea [ ] ; BRBPR [ ]   GU: Hematuria[ ] ; Dysuria  [ ] ; Nocturia[ ]   Vascular: Pain in legs with walking [Y]; Pain in feet with lying flat [ ] ; Non-healing sores [ ] ; Stroke [ ] ; TIA [ ] ; Slurred speech [ ] ;  Neuro: Headaches[ ] ; Vertigo[ ] ; Seizures[ ] ; Paresthesias[ ] ;Blurred vision [ ] ; Diplopia [ ] ; Vision changes [ ]   Ortho/Skin: Arthritis [ ] ; Joint pain [Y ]; Muscle pain [Y ]; Joint swelling [ ] ; Back Pain [Y ]; Rash [ ]   Psych: Depression[ ] ; Anxiety[ ]   Heme: Bleeding problems [ ] ; Clotting disorders [ ] ; Anemia [ ]   Endocrine: Diabetes [ ] ; Thyroid dysfunction[ ]   Home Medications Prior to Admission medications   Medication Sig Start Date End Date Taking? Authorizing Provider  acetaminophen (TYLENOL) 325 MG tablet Take 650 mg by mouth every 6 (six) hours as needed (pain).    Historical Provider, MD  amiodarone (PACERONE) 200 MG tablet Take 1 tablet (200 mg total) by mouth 2 (two) times daily. 11/30/14   Jolaine Artist, MD  amLODipine (NORVASC) 10 MG tablet Take 10 mg by mouth daily.    Historical Provider, MD  atorvastatin (LIPITOR) 80 MG tablet Take 80 mg by mouth daily.     Historical Provider, MD  docusate sodium (COLACE) 100 MG capsule Take 200 mg by mouth daily.     Historical Provider, MD  furosemide (LASIX)  20 MG tablet Take 20 mg by mouth daily as needed for fluid.     Historical Provider, MD  levothyroxine (SYNTHROID, LEVOTHROID) 175 MCG tablet Take 1 tablet (175 mcg total) by mouth daily before breakfast. 12/31/14   Deboraha Sprang, MD  magnesium gluconate (MAGONATE) 500 MG tablet Take 1,000 mg by mouth at bedtime.     Historical Provider, MD  Multiple Vitamin (MULTIVITAMIN WITH MINERALS) TABS tablet Take 1 tablet by mouth at bedtime.    Historical Provider, MD  Multiple Vitamins-Minerals (ICAPS PO) Take 1 tablet by mouth 2 (two) times daily.    Historical Provider, MD  nitroGLYCERIN (NITROSTAT) 0.4 MG SL tablet Place 0.4 mg under the tongue every 5 (five) minutes as needed for chest pain.     Historical Provider, MD   oxyCODONE-acetaminophen (PERCOCET/ROXICET) 5-325 MG per tablet Take 1-2 tablets by mouth every 4 (four) hours as needed for moderate pain. 02/22/15   Amy D Clegg, NP  pantoprazole (PROTONIX) 40 MG tablet Take 1 tablet (40 mg total) by mouth 2 (two) times daily. 02/24/15   Amy D Ninfa Meeker, NP  promethazine (PHENERGAN) 12.5 MG tablet Take 1 tablet (12.5 mg total) by mouth every 6 (six) hours as needed for nausea or vomiting. 01/25/15   Larey Dresser, MD  RANEXA 500 MG 12 hr tablet TAKE 1 TABLET (500 MG TOTAL) BY MOUTH 2 (TWO) TIMES DAILY. 02/07/15   Larey Dresser, MD  warfarin (COUMADIN) 2 MG tablet Take 2mg  daily, except takes 4mg  on Monday and Friday. 02/22/15   Amy D Ninfa Meeker, NP  zinc gluconate 50 MG tablet Take 100 mg by mouth at bedtime.     Historical Provider, MD    Past Medical History: Past Medical History  Diagnosis Date  . Bradycardia   . Ventricular tachycardia     s/p AICD;   h/o ICD shock;  Amiodarone Rx. S/P ablation in 2012  . 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 HTN (hypertension)   . HLD (hyperlipidemia)   . Cytopenia   . Hypothyroidism   . Renal insufficiency   . Claudication   . Chronic systolic heart failure   . Ischemic cardiomyopathy     echo 7/12:  EF 25-30%, mild AI, mod MR, mod LAE  . TIA (transient ischemic attack) 2001  . CVD (cerebrovascular disease)     left CEA 2008  . Stroke   . Anemia   . Inappropriate therapy from implantable cardioverter-defibrillator 04/02/2013    ATP for sinus tach  SVT wavelet identified SVT but was no  passive   . Myocardial infarction 1992  . Anginal pain   . Automatic implantable cardioverter-defibrillator in situ     Medtronic Evera device serial number [id number]    . Arthritis     "a little in my knees" (05/25/2013)  . Melanoma of ear 2013    "near left ear" (05/25/2013)  . 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 Dysrhythmia     HX A FIB  . History of nonmelanoma skin cancer     L EAR  . Kidney stone   . History of transfusion   . Sleep apnea     denies wearing mask (05/25/2013)  . CAD (coronary artery disease)     s/p MI 1982;  s/p DES to CFX 2011;  s/p NSTEMI 4/12 in setting of VTach;  cath 7/12:  LM ok, LAD mid 30-40%, Dx's with 40%, mCFX stent patent, prox to mid RCA occluded with R to R and L to R collats.  Medical Tx was continued  . CHF (congestive heart failure)     HAS VENTRICULAR PUMP    Past Surgical History: Past Surgical History  Procedure Laterality Date  . Femoral-popliteal bypass graft Right 2011  . Cardiac defibrillator placement  2003; 2005; 05/25/2013    2014: Medtronic Evera device serial number [id number]  . Revascularization / in-situ graft leg    . Renal artery bypass Bilateral 2003  . Back surgery    . Lumbar disc surgery  1974; 2000's  . Knee arthroscopy Bilateral 1990's  . Elbow arthroscopy Right 1990's  . Cholecystectomy  12/15/?2010  . Lipoma excision Left 2013    "near ear" (05/25/2013)  . Heel spur surgery Right 1990's  . Cataract extraction w/ intraocular lens  implant, bilateral Bilateral 2000  . Carotid endarterectomy Left ~ 2007  . Doppler echocardiography  2011  . Tonsillectomy  1946  . Femoral-popliteal bypass graft Left 05/2002    Archie Endo 05/06/2002 (05/25/2013)  . Tumor excision Left 1960's    "fatty tumor" (05/25/2013)  . Cardiac electrophysiology study and ablation  2012  . Coronary angioplasty  1992  . Cardiac catheterization      "several" (05/25/2013)  . Coronary angioplasty with stent placement      "last one was 11/2012  (05/25/2013)  . Cardioversion N/A 10/15/2013    Procedure: CARDIOVERSION;  Surgeon: Jolaine Artist, MD;  Location: [place];  Service: Cardiovascular;  Laterality: N/A;  . Insertion of implantable left ventricular assist device N/A 10/19/2013    Procedure: INSERTION OF IMPLANTABLE LEFT VENTRICULAR ASSIST  DEVICE;  Surgeon: Gaye Pollack, MD;  Location: [place];  Service: Open Heart Surgery;  Laterality: N/A;  CIRC ARREST  NITRIC OXIDE  MEDTRONIC ICD  . Intraoperative transesophageal echocardiogram N/A 10/19/2013    Procedure: INTRAOPERATIVE TRANSESOPHAGEAL ECHOCARDIOGRAM;  Surgeon: Gaye Pollack, MD;  [facility];  Service: Open Heart Surgery;  Laterality: N/A;  . Tee without cardioversion N/A 11/04/2013    Procedure: TRANSESOPHAGEAL ECHOCARDIOGRAM (TEE);  Surgeon: Larey Dresser, MD;  Location: [place];  Service: Cardiovascular;  Laterality: N/A;  . Cardioversion N/A 11/04/2013    Procedure: CARDIOVERSION;  Surgeon: Larey Dresser, MD;  Location: [place];  Service: Cardiovascular;  Laterality: N/A;  . Cardioversion N/A 06/04/2014    Procedure: CARDIOVERSION;  Surgeon: Jolaine Artist, MD;  Location: [facility] ENDOSCOPY;  Service: Cardiovascular;  Laterality: N/A;  . Cystoscopy with retrograde pyelogram, ureteroscopy and stent placement Left 08/09/2014    Procedure: [place], URETEROSCOPY AND STENT PLACEMENT;  Surgeon: Bernestine Amass, MD;  Location: [facility] ORS;  Service: Urology;  Laterality: Left;  . Cystoscopy with retrograde pyelogram, ureteroscopy and stent placement Left 09/01/2014    Procedure: CYSTOSCOPY WITH URETEROSCOPY AND STENT REMOVAL;  Surgeon: Bernestine Amass, MD;  Location: [facility]S;  Service: Urology;  Laterality: Left;  . Holmium laser application Left 82/99/3716    Procedure: HOLMIUM LASER APPLICATION;  Surgeon: Bernestine Amass, MD;  Location: [facility];  Service: Urology;  Laterality: Left;  . Cystoscopy with retrograde pyelogram, ureteroscopy and stent placement Left 09/20/2014    Procedure: CYSTOSCOPY WITH RETROGRADE PYELOGRAM, URETEROSCOPY AND STENT PLACEMENT, stone removal;  Surgeon: Bernestine Amass, MD;  Location: [facility];  Service:  Urology;  Laterality: Left;  Stephanie Coup ablation N/A 09/12/2011    Procedure: V-TACH ABLATION;  Surgeon: Evans Lance, MD;  Location: [facility] CATH LAB;  Service: Cardiovascular;  Laterality: N/A;  . Left heart catheterization with coronary angiogram N/A 11/24/2012    Procedure: LEFT HEART CATHETERIZATION WITH CORONARY ANGIOGRAM;  Surgeon: Peter M Martinique, MD;  Location: [facility] CATH LAB;  Service: Cardiovascular;  Laterality: N/A;  . Implantable cardioverter defibrillator generator change N/A 05/25/2013    Procedure: IMPLANTABLE CARDIOVERTER DEFIBRILLATOR GENERATOR CHANGE;  Surgeon: Deboraha Sprang, MD;  Location: [facility] CATH LAB;  Service: Cardiovascular;  Laterality: N/A;  . Lead revision N/A 06/05/2013    Procedure: LEAD REVISION;  Surgeon: Thompson Grayer, MD;  Location: [facility] CATH LAB;  Service: Cardiovascular;  Laterality: N/A;  . V-tach ablation N/A 06/08/2013    Procedure: V-TACH ABLATION;  Surgeon: Evans Lance, MD;  Location: [facility] CATH LAB;  Service: Cardiovascular;  Laterality: N/A;  . Right heart catheterization N/A 09/17/2013    Procedure: RIGHT HEART CATH;  Surgeon: Jolaine Artist, MD;  Location: [facility] CATH LAB;  Service: Cardiovascular;  Laterality: N/A;  . Right heart catheterization N/A 10/14/2013    Procedure: RIGHT HEART CATH;  Surgeon: Jolaine Artist, MD;  Location: [facility] CATH LAB;  Service: Cardiovascular;  Laterality: N/A;  . Right heart catheterization N/A 11/16/2013    Procedure: RIGHT HEART CATH;  Surgeon: Jolaine Artist, MD;  Location: [facility] CATH LAB;  Service: Cardiovascular;  Laterality: N/A;  . Right heart catheterization N/A 07/13/2014    Procedure: RIGHT HEART CATH;  Surgeon: Jolaine Artist, MD;  Location: [facility] CATH LAB;  Service: Cardiovascular;  Laterality: N/A;  . Esophagogastroduodenoscopy N/A 02/15/2015    Procedure: ESOPHAGOGASTRODUODENOSCOPY (EGD);  Surgeon: Carol Ada, MD;  Location: [facility] ENDOSCOPY;  Service: Endoscopy;  Laterality: N/A;  LVAD    Family History: Family History  Problem Relation Age of Onset  . Coronary artery disease    . Heart attack Mother   . Coronary artery disease  Mother   . Cancer Mother   . Heart disease Mother   . Hyperlipidemia Mother   . Hypertension Mother   . Coronary artery disease Father   . Stroke Father   . Cancer Father   . Heart disease Father     before age 20  . Hyperlipidemia Father   . Hypertension Father   . Heart attack Father     Social History: History   Social History  . Marital Status: Divorced    Spouse Name: N/A  . Number of Children: 2  . Years of Education: N/A   Occupational History  . retired     [employer]  . 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 Smoking status: Former Smoker -- 0.50 packs/day for 37 years    Types: Cigarettes    Quit date: 11/05/2001  . Smokeless tobacco: Never Used  . Alcohol Use: 0.0 oz/week     Comment: 05/25/2013 "mixed drink couple times/month"  . Drug Use: No  . Sexual Activity: No   Other Topics Concern  . None   Social History Narrative   Lives with sig other.    Allergies:  Allergies  Allergen Reactions  . Demerol [Meperidine] Other (See Comments)    Paralysis. Could only move eyes.    Objective:    Vital Signs:   Temp:  [97.7  F (36.5 C)] 97.7 F (36.5 C) (04/25 1235) Pulse Rate:  [68-69] 69 (04/25 1330) Resp:  [15-18] 15 (04/25 1330) SpO2:  [98 %] 98 % (04/25 1330)   There were no vitals filed for this visit.  Mean arterial Pressure  86   Physical Exam: General:  Fatigued appearing. No resp difficulty HEENT: normal Neck: supple. JVP flat  Carotids 2+ bilat; no bruits. No lymphadenopathy or thryomegaly appreciated. Cor: Mechanical heart sounds with LVAD hum present. Lungs: clear Abdomen: soft, nontender, nondistended. No hepatosplenomegaly. No bruits or masses. Good bowel sounds. Driveline: C/D/I; securement device intact and driveline incorporated Extremities: no cyanosis, clubbing, rash, edema Neuro: alert & orientedx3, cranial nerves grossly intact. moves all 4  extremities w/o difficulty. Affect pleasant  Telemetry:  SR 71 bpm   Labs: Basic Metabolic Panel: No results for input(s): NA, K, CL, CO2, GLUCOSE, BUN, CREATININE, CALCIUM, MG, PHOS in the last 168 hours.  Liver Function Tests: No results for input(s): AST, ALT, ALKPHOS, BILITOT, PROT, ALBUMIN in the last 168 hours. No results for input(s): LIPASE, AMYLASE in the last 168 hours. No results for input(s): AMMONIA in the last 168 hours.  CBC:  Recent Labs Lab 02/28/15 1306  WBC 8.0  HGB 12.3*  HCT 38.5*  MCV 94.6  PLT 276    Cardiac Enzymes: No results for input(s): CKTOTAL, CKMB, CKMBINDEX, TROPONINI in the last 168 hours.  BNP: BNP (last 3 results)  Recent Labs  11/25/14 1335  BNP 420.8*    ProBNP (last 3 results)  Recent Labs  09/09/14 1038 09/13/14 1024 10/14/14 0936  PROBNP 5067.0* 5152.0* 1880.0*     CBG: No results for input(s): GLUCAP in the last 168 hours.  Coagulation Studies: No results for input(s): LABPROT, INR in the last 72 hours.  Other results: EKG: Atrial tach with 2:1 conduction  Imaging:  No results found.      Assessment:  1. Presyncope 2. Nausea/vomitting 3.  Lumbar Compression Fracture  4. 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 H/O GI bleed 02/2015 - S/P EGD with 2 AVMs clipped in jejunum. 6. CAD 7. Paroxysmal atrial fibrillation 8. H/O 09/2015 Nephrolithiasis with J stent 9. Chronic Anticoagulation - on coumadin - Goal for INR 2-2.5  10. Recurrent atrial tach   Plan/Discussion:   Mr Furr is a 74 year old with LVAD HMII admitted today for nausea, vomiting, and failure to thrive. 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 As a result of his fall he had L4 compression fracture that did not require any intervention. He was discharged on April 19 th and has been struggling with nausea/vomiting, and  failure to thrive. He has had increased PI events over the last 3 days -->20-->35-->30.   From HF perspective his volume seems low. Will not place on diuretics.   We will check labs to try and sort this out. Hgb ok 12.2. Creatinine up a little 1.5 .   LDH up to 373 which is hight for him. INR pending. Pharmacy to dose coumadin to maintain INR 2-2.5. He is not aspirin with history of GI bleed. Continue protonix twice a day.   Will ask Medtronic to interrogate his device.   Continue pain control for back pain. Continue percocet very 4 hours. Consult PT. May need CIR to increase mobility.   I reviewed the LVAD parameters from today, and compared the results  to the patient's prior recorded data.  No programming changes were made.  The LVAD is functioning within specified parameters.  The patient performs LVAD self-test daily.  LVAD interrogation was negative for any significant power changes, alarms or PI events/speed drops.  LVAD equipment check completed and is in good working order.  Back-up equipment present.   LVAD education done on emergency procedures and precautions and reviewed exit site care.  Length of Stay: 0  CLEGG,AMY NP-C  02/28/2015, 1:49 PM  VAD Team Pager [phone] (7am - 7am) +++VAD ISSUES ONLY+++  Advanced Heart Failure Team Pager [phone] (M-F; [place])  Please contact [facility] for night-coverage after hours (4p -7a ) and weekends on [email or web address] for all non- LVAD Issues  Patient seen and examined with Darrick Grinder, NP. We discussed all aspects of the encounter. I agree with the assessment and plan as stated above.   He has recurrent presyncope and fatigue in the setting of recurrent atrial tach and probable GI virus. Will admit. Hold diuretics. He has failed over drive pacing in ER. Will plan TEE and DC-CV once INR > 2.0. Will increase amio to 400 bid. Continue Ranexa. VAD and ICD interrogated personally at bedside.   Benay Spice 3:46 PM

## 2015-02-28 NOTE — Telephone Encounter (Signed)
[name] called to report pt feeling "weak and dizzy" and has been having his "vomiting spells" more frequently since discharge.  Called pt - he has thrown up three times this am, unable to keep food down, will try to increase fluid intake.  He reports extreme fatigue, weakness, and "eyes rolling around". Pt denies syncope episodes, but admits to "stumbling" this am.  He denies bright red blood or black stools. VAD parameters:  Speed 9200, flow 3.9, PI 4.3, and power 4.7; denies any VAD alarms. Dr. Haroldine Laws updated - advised pt to come to ED asap. I called and updated [name] (who is at work); she will have pt here in one hour. Notified ED charge re: pt arrival, she will page VAD pager when pt arrives.

## 2015-02-28 NOTE — ED Notes (Signed)
Dinner tray ordered.

## 2015-03-01 ENCOUNTER — Inpatient Hospital Stay (HOSPITAL_COMMUNITY): Payer: Medicare Other

## 2015-03-01 DIAGNOSIS — M545 Low back pain: Secondary | ICD-10-CM

## 2015-03-01 LAB — TSH: TSH: 20.716 u[IU]/mL — ABNORMAL HIGH (ref 0.350–4.500)

## 2015-03-01 LAB — LACTATE DEHYDROGENASE: LDH: 249 U/L (ref 94–250)

## 2015-03-01 LAB — CBC WITH DIFFERENTIAL/PLATELET
BASOS PCT: 1 % (ref 0–1)
Basophils Absolute: 0 10*3/uL (ref 0.0–0.1)
EOS PCT: 5 % (ref 0–5)
Eosinophils Absolute: 0.2 10*3/uL (ref 0.0–0.7)
HCT: 33.3 % — ABNORMAL LOW (ref 39.0–52.0)
Hemoglobin: 10.5 g/dL — ABNORMAL LOW (ref 13.0–17.0)
Lymphocytes Relative: 17 % (ref 12–46)
Lymphs Abs: 0.8 10*3/uL (ref 0.7–4.0)
MCH: 30 pg (ref 26.0–34.0)
MCHC: 31.5 g/dL (ref 30.0–36.0)
MCV: 95.1 fL (ref 78.0–100.0)
Monocytes Absolute: 0.7 10*3/uL (ref 0.1–1.0)
Monocytes Relative: 16 % — ABNORMAL HIGH (ref 3–12)
Neutro Abs: 2.9 10*3/uL (ref 1.7–7.7)
Neutrophils Relative %: 61 % (ref 43–77)
Platelets: 226 10*3/uL (ref 150–400)
RBC: 3.5 MIL/uL — ABNORMAL LOW (ref 4.22–5.81)
RDW: 18.8 % — ABNORMAL HIGH (ref 11.5–15.5)
WBC: 4.6 10*3/uL (ref 4.0–10.5)

## 2015-03-01 LAB — PROTIME-INR
INR: 1.93 — ABNORMAL HIGH (ref 0.00–1.49)
Prothrombin Time: 22.2 seconds — ABNORMAL HIGH (ref 11.6–15.2)

## 2015-03-01 LAB — MAGNESIUM: Magnesium: 2.1 mg/dL (ref 1.5–2.5)

## 2015-03-01 MED ORDER — WARFARIN SODIUM 5 MG PO TABS
5.0000 mg | ORAL_TABLET | ORAL | Status: AC
  Administered 2015-03-01: 5 mg via ORAL
  Filled 2015-03-01: qty 1

## 2015-03-01 MED ORDER — GABAPENTIN 600 MG PO TABS
300.0000 mg | ORAL_TABLET | Freq: Every day | ORAL | Status: DC
  Filled 2015-03-01: qty 0.5

## 2015-03-01 MED ORDER — SODIUM CHLORIDE 0.9 % IJ SOLN
3.0000 mL | Freq: Two times a day (BID) | INTRAMUSCULAR | Status: DC
  Administered 2015-03-01 – 2015-03-04 (×6): 3 mL via INTRAVENOUS
  Administered 2015-03-04 – 2015-03-05 (×2): via INTRAVENOUS

## 2015-03-01 MED ORDER — SODIUM CHLORIDE 0.9 % IJ SOLN: 3.0000 mL | Freq: Two times a day (BID) | INTRAMUSCULAR | Status: DC

## 2015-03-01 MED ORDER — SODIUM CHLORIDE 0.9 % IV SOLN: 250.0000 mL | INTRAVENOUS | Status: DC

## 2015-03-01 MED ORDER — SENNA 8.6 MG PO TABS
1.0000 | ORAL_TABLET | Freq: Every day | ORAL | Status: DC
  Administered 2015-03-01 – 2015-03-04 (×4): 8.6 mg via ORAL
  Filled 2015-03-01 (×5): qty 1

## 2015-03-01 MED ORDER — GABAPENTIN 300 MG PO CAPS
300.0000 mg | ORAL_CAPSULE | Freq: Every day | ORAL | Status: DC
  Administered 2015-03-01 – 2015-03-04 (×4): 300 mg via ORAL
  Filled 2015-03-01 (×5): qty 1

## 2015-03-01 MED ORDER — SODIUM CHLORIDE 0.9 % IV SOLN
INTRAVENOUS | Status: DC
  Administered 2015-03-02 (×3): via INTRAVENOUS

## 2015-03-01 MED ORDER — GABAPENTIN 600 MG PO TABS
300.0000 mg | ORAL_TABLET | Freq: Every day | ORAL | Status: DC
  Filled 2015-03-01 (×2): qty 0.5

## 2015-03-01 MED ORDER — HYDRALAZINE HCL 20 MG/ML IJ SOLN
INTRAMUSCULAR | Status: AC
  Filled 2015-03-01: qty 1

## 2015-03-01 MED ORDER — SODIUM CHLORIDE 0.9 % IJ SOLN: 3.0000 mL | INTRAMUSCULAR | Status: DC | PRN

## 2015-03-01 MED ORDER — AMIODARONE HCL 200 MG PO TABS
400.0000 mg | ORAL_TABLET | Freq: Two times a day (BID) | ORAL | Status: DC
  Administered 2015-03-01 – 2015-03-05 (×9): 400 mg via ORAL
  Filled 2015-03-01 (×10): qty 2

## 2015-03-01 MED ORDER — SODIUM CHLORIDE 0.9 % IV SOLN: INTRAVENOUS | Status: DC

## 2015-03-01 NOTE — Progress Notes (Signed)
Patient ID: [name], male   DOB: January 26, 1941, 74 y.o.   MRN: [id number] I did come by and examine [name] as well as review his latest spine films.  He says that he takes 2 Percocet at a time and that helps him significantly.  He reports pain sometimes at a 9 out of ten when he is up.  His x-rays show a stable L4 compression fracture with no interval change.  It is mainly the superior endplate and minimal compression.  It should still do well clinically.  He does have some pain to deep palpation of his L spine at the lower level.  No further recs at this standpoint.  I doubt a lumar support would do much for him.  Neurontin 300 mg nightly will be started tonight and I talked with him about try to ween some from the percocet.

## 2015-03-01 NOTE — Care Management Note (Addendum)
    Page 1 of 2   03/06/2015     9:31:46 AM CARE MANAGEMENT NOTE 03/06/2015  Patient:  [name], [name]   Account Number:  [id number]  Date Initiated:  03/01/2015  Documentation initiated by:  Elissa Hefty  Subjective/Objective Assessment:   adm w at fib, wt loss     Action/Plan:   lives w fam, pcp dr Bary Castilla, has lvad   Anticipated DC Date:  03/05/2015   Anticipated DC Plan:  [place]  CM consult      [facility] Choice  HOME HEALTH   Choice offered to / List presented to:  C-1 Patient        [place] arranged  HH-1 RN  [place].   Status of service:  Completed, signed off Medicare Important Message given?  YES (If response is "NO", the following Medicare IM given date fields will be blank) Date Medicare IM given:  03/03/2015 Medicare IM given by:  Elissa Hefty Date Additional Medicare IM given:   Additional Medicare IM given by:    Discharge Disposition:  [place]  Per UR Regulation:  Reviewed for med. necessity/level of care/duration of stay  If discussed at [place] of Stay Meetings, dates discussed:    Comments:  03/05/15 CM received call from RN notifying of discharge. CM called [facility] rep, Lecretia to notify for [facility].  No other CM needs were communicated.  Mariane Masters, BSn, [place].  4/294/28 1528 debbie dowell rn,bsn spoke w pt. phy ther had rec cir and cir pa saw notes and feel he will progress over next few days to hhc level. spoke w pt and he used [facility] in past and would like to use them for hhc. ref to donna w [facility] for hhrn and hhpt.  ranexa covered by ins w no prior auth. pt will have 40.00 per month copay for ranexa.

## 2015-03-01 NOTE — Progress Notes (Signed)
HeartMate 2 Rounding Note  Subjective:    Admitted with nausea/vomiting and fatigue. Found to have a tach. Failed overdrive pacing yesterday.  INR now 1.93.  Had dizziness this am. Still with severe back pain. Taking pain medications every 4 hours.    LDH 294 INR 1.93  Hgb 10.3    LVAD INTERROGATION:  HeartMate II LVAD:  Flow 3.3 liters/min, speed 9200, power 5.0, PI 5.6  No PI events over night.    Objective:    Vital Signs:   Temp:  [97.7 F (36.5 C)-98.3 F (36.8 C)] 98.2 F (36.8 C) (04/26 0300) Pulse Rate:  [66-102] 69 (04/26 0400) Resp:  [8-20] 10 (04/26 0400) SpO2:  [90 %-100 %] 91 % (04/26 0400) Weight:  [188 lb (85.276 kg)-191 lb 9.6 oz (86.909 kg)] 191 lb 9.6 oz (86.909 kg) (04/26 0600) Last BM Date: 02/28/15 Mean arterial Pressure 78-110  Intake/Output:   Intake/Output Summary (Last 24 hours) at 03/01/15 0710 Last data filed at 02/28/15 2200  Gross per 24 hour  Intake      0 ml  Output    425 ml  Net   -425 ml     Physical Exam: General:  Well appearing. No resp difficulty. In bed.  HEENT: normal Neck: supple. JVP 8-9 . Carotids 2+ bilat; no bruits. No lymphadenopathy or thryomegaly appreciated. Cor: Mechanical heart sounds with LVAD hum present. Lungs: clear Abdomen: soft, nontender, nondistended. No hepatosplenomegaly. No bruits or masses. Good bowel sounds. Driveline: C/D/I; securement device intact and driveline incorporated Extremities: no cyanosis, clubbing, rash, edema RLE LLE SCDs Neuro: alert & orientedx3, cranial nerves grossly intact. moves all 4 extremities w/o difficulty. Affect pleasant  Telemetry: AV paced 70s   Labs: Basic Metabolic Panel:  Recent Labs Lab 02/28/15 1306 03/01/15 0333  NA 135  --   K 4.9  --   CL 102  --   CO2 21  --   GLUCOSE 110*  --   BUN 19  --   CREATININE 1.51*  --   CALCIUM 9.0  --   MG  --  2.1    Liver Function Tests: No results for input(s): AST, ALT, ALKPHOS, BILITOT, PROT, ALBUMIN in the last  168 hours. No results for input(s): LIPASE, AMYLASE in the last 168 hours. No results for input(s): AMMONIA in the last 168 hours.  CBC:  Recent Labs Lab 02/28/15 1306 03/01/15 0333  WBC 8.0 4.6  NEUTROABS  --  2.9  HGB 12.3* 10.5*  HCT 38.5* 33.3*  MCV 94.6 95.1  PLT 276 226    INR:  Recent Labs Lab 02/28/15 1306 03/01/15 0333  INR 1.83* 1.93*    Other results:    Imaging:  No results found.   Medications:     Scheduled Medications: . amiodarone  200 mg Oral BID  . amLODipine  10 mg Oral Daily  . atorvastatin  80 mg Oral Daily  . docusate sodium  200 mg Oral Daily  . levothyroxine  175 mcg Oral QAC breakfast  . magnesium gluconate  1,000 mg Oral QHS  . multivitamin with minerals  1 tablet Oral QHS  . pantoprazole  40 mg Oral BID  . ranolazine  500 mg Oral BID  . warfarin  5 mg Oral ONCE-1800  . Warfarin - Pharmacist Dosing Inpatient   Does not apply q1800     Infusions:     PRN Medications:  acetaminophen, magnesium hydroxide, ondansetron, oxyCODONE-acetaminophen, promethazine   Assessment:   1. Atrial tach  2. Nausea 3. Lumbar Compression Fracture with ongoing back pain 4. 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 H/O GI bleed 02/2015 - S/P EGD with 2 AVMs clipped in jejunum. 6. CAD 7. Paroxysmal atrial fibrillation 8. H/O 09/2015 Nephrolithiasis with J stent 9. Chronic Anticoagulation - on coumadin - Goal for INR 2-2.5    Plan/Discussion:    INR 1.9 plan for TEE /DC/CV tomorrow if INR 2.0 or greater. Increase amio to 400 mg twice a day. Check TSH    Volume status ok. Hold off on diuretics.   Pain controlled this am . Continue percocet as needed.   I reviewed the LVAD parameters from today, and compared the results to the patient's prior recorded data.  No programming changes were made.  The LVAD is functioning within specified parameters.  The  patient performs LVAD self-test daily.  LVAD interrogation was negative for any significant power changes, alarms or PI events/speed drops.  LVAD equipment check completed and is in good working order.  Back-up equipment present.   LVAD education done on emergency procedures and precautions and reviewed exit site care.  Length of Stay: 1  CLEGG,AMY NP-C  03/01/2015, 7:10 AM  VAD Team --- VAD ISSUES ONLY--- Pager [phone] (7am - 7am)  Advanced Heart Failure Team  Pager [phone] (M-F; 7a - 4p)  Please contact [facility] for night-coverage after hours (4p -7a ) and weekends on [email or web address]  Patient seen and examined with Darrick Grinder, NP. We discussed all aspects of the encounter. I agree with the assessment and plan as stated above.   Continues to feel weak and have significant back pain despite around the clock percocet. INR 1.9.   Have contacted Dr. Ninfa [facility] in ortho. We will repeat lumbar spine films and add neurontin for pain control.   If INR >= 2.0 tomorrow will proceed with TEE and DC-CV of atrial tach.   Clarrisa Kaylor,MD 1:46 PM

## 2015-03-01 NOTE — Progress Notes (Signed)
[place] for coumadin Indication: LVAD  Allergies  Allergen Reactions  . Demerol [Meperidine] Other (See Comments)    Paralysis. Could only move eyes.    Patient Measurements: Height: 6' (182.9 cm) Weight: 191 lb 9.6 oz (86.909 kg) IBW/kg (Calculated) : 77.6 Vital Signs: Temp: 97.7 F (36.5 C) (04/26 1124) Temp Source: Oral (04/26 1124) Pulse Rate: 67 (04/26 0800)  Labs:  Recent Labs  02/28/15 1306 03/01/15 0333  HGB 12.3* 10.5*  HCT 38.5* 33.3*  PLT 276 226  LABPROT 21.3* 22.2*  INR 1.83* 1.93*  CREATININE 1.51*  --     Estimated Creatinine Clearance: 46.4 mL/min (by C-G formula based on Cr of 1.51).   Medical History: Past Medical History  Diagnosis Date  . Bradycardia   . Ventricular tachycardia     s/p AICD;   h/o ICD shock;  Amiodarone Rx. S/P ablation in 2012  . 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 HTN (hypertension)   . HLD (hyperlipidemia)   . Cytopenia   . Hypothyroidism   . Renal insufficiency   . Claudication   . Chronic systolic heart failure   . Ischemic cardiomyopathy     echo 7/12:  EF 25-30%, mild AI, mod MR, mod LAE  . TIA (transient ischemic attack) 2001  . CVD (cerebrovascular disease)     left CEA 2008  . Stroke   . Anemia   . Inappropriate therapy from implantable cardioverter-defibrillator 04/02/2013    ATP for sinus tach  SVT wavelet identified SVT but was no  passive   . Myocardial infarction 1992  . Anginal pain   . Automatic implantable cardioverter-defibrillator in situ     [employer]onic Evera device serial number [id number]    . Arthritis     "a little in my knees" (05/25/2013)  . Melanoma of ear 2013    "near left ear" (05/25/2013)  . 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 Dysrhythmia     HX A FIB  . History of nonmelanoma skin cancer     L EAR  .  Kidney stone   . History of transfusion   . Sleep apnea     denies wearing mask (05/25/2013)  . CAD (coronary artery disease)     s/p MI 1982;  s/p DES to CFX 2011;  s/p NSTEMI 4/12 in setting of VTach;  cath 7/12:  LM ok, LAD mid 30-40%, Dx's with 40%, mCFX stent patent, prox to mid RCA occluded with R to R and L to R collats.  Medical Tx was continued  . CHF (congestive heart failure)     HAS VENTRICULAR PUMP    Assessment: 74 yo male with h/o VT, PAD and severe systolic HF (EF 41-32%) due to mixed ischemic/nonischemic cardiomyopathy. He is s/p HM II LVAD implant for destination therapy on October 19, 2013. On 09/01/14 underwent cystoscopy and flexible ureteroscopy with lithotripsy and basket extraction. 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 Had a fall in April that resulted in compression fracture to L4 requiring 48 hour hospital admit.  INR on admission is low at 1.8, patient has had frequent readmissions. Last INR check on 4/7 had a therapeutic INR on 4mg  on m/f and 2mg  other days.  Patient to be cardioverted tomorrow 4/27 if INR>2. INR today is 1.93. Gave coumadin this AM already. CBC stable. No bleeding noted.  Goal of Therapy:  INR goal 2-2.5 Monitor platelets by anticoagulation protocol: Yes   Plan:  Warfarin 5mg  already given this AM Daily INR Mon s/sx bleed DCCV tomorrow if INR>2  Elicia Lamp, PharmD Clinical Pharmacist - Resident Pager [phone] 03/01/2015 12:08 PM

## 2015-03-01 NOTE — Progress Notes (Signed)
Admitted 02/28/15 due to nausea, presyncope, recurrent atrial tachycardia, and lumbar compression fracture and associated pain.  HeartMate II LVAD implated on 10/19/13 by Dr. Cyndia Bent as DT LVAD.  Vital signs: Temp:  97.7 HR:  70's  Doppler MAP:  63 - 110 O2 Sat:  94 - 99 Wt in lbs:  191 > 188  Telemetry: AV paced    LVAD interrogation reveals:  Speed:  9200 Flow:  3.7 Power:  5.2 PI: 5.6 Alarms:  none Events:  none Fixed speed:  9200 Low speed limit: 8600  Drive Line:  Sorbaview dressing dry and intact; attachment device in place. Dressing is being changed weekly per sterile technique.   Labs:  LDH trend:  372 > 249  INR trend:  1.83 > 1.93  Hgb trend:  12.3 > 10.5  Plan/Recommendations as discussed with team: 1.  TEE/CV scheduled for tomorrow at 1 pm in endo suite if INR 2.0 or greater. Caregiver updated. 2.  Improved appetite today, ate breakfast with no reported N&V. 3.  Ortho input for compression fracture of spine. 4.  Monitor for constipation due to pain medication.

## 2015-03-02 ENCOUNTER — Encounter (HOSPITAL_COMMUNITY)
Admission: EM | Disposition: A | Discharge status: Home / Self Care | Payer: Self-pay | Source: Home / Self Care | Attending: Internal Medicine

## 2015-03-02 ENCOUNTER — Inpatient Hospital Stay (HOSPITAL_COMMUNITY): Payer: Medicare Other | Admitting: Anesthesiology

## 2015-03-02 ENCOUNTER — Encounter (HOSPITAL_COMMUNITY): Payer: Self-pay

## 2015-03-02 DIAGNOSIS — I340 Nonrheumatic mitral (valve) insufficiency: Secondary | ICD-10-CM

## 2015-03-02 DIAGNOSIS — I4891 Unspecified atrial fibrillation: Secondary | ICD-10-CM

## 2015-03-02 HISTORY — PX: CARDIOVERSION: SHX1299

## 2015-03-02 HISTORY — PX: TEE WITHOUT CARDIOVERSION: SHX5443

## 2015-03-02 LAB — CBC WITH DIFFERENTIAL/PLATELET
BASOS ABS: 0 10*3/uL (ref 0.0–0.1)
Basophils Relative: 0 % (ref 0–1)
Eosinophils Absolute: 0.2 10*3/uL (ref 0.0–0.7)
Eosinophils Relative: 5 % (ref 0–5)
HEMATOCRIT: 32.1 % — AB (ref 39.0–52.0)
Hemoglobin: 10.1 g/dL — ABNORMAL LOW (ref 13.0–17.0)
LYMPHS ABS: 0.7 10*3/uL (ref 0.7–4.0)
LYMPHS PCT: 14 % (ref 12–46)
MCH: 29.8 pg (ref 26.0–34.0)
MCHC: 31.5 g/dL (ref 30.0–36.0)
MCV: 94.7 fL (ref 78.0–100.0)
MONO ABS: 0.7 10*3/uL (ref 0.1–1.0)
Monocytes Relative: 14 % — ABNORMAL HIGH (ref 3–12)
Neutro Abs: 3.2 10*3/uL (ref 1.7–7.7)
Neutrophils Relative %: 67 % (ref 43–77)
Platelets: 211 10*3/uL (ref 150–400)
RBC: 3.39 MIL/uL — AB (ref 4.22–5.81)
RDW: 18.5 % — AB (ref 11.5–15.5)
WBC: 4.8 10*3/uL (ref 4.0–10.5)

## 2015-03-02 LAB — BASIC METABOLIC PANEL
Anion gap: 7 (ref 5–15)
BUN: 19 mg/dL (ref 6–23)
CALCIUM: 8.4 mg/dL (ref 8.4–10.5)
CO2: 27 mmol/L (ref 19–32)
CREATININE: 1.49 mg/dL — AB (ref 0.50–1.35)
Chloride: 101 mmol/L (ref 96–112)
GFR, EST AFRICAN AMERICAN: 51 mL/min — AB (ref >90)
GFR, EST NON AFRICAN AMERICAN: 44 mL/min — AB (ref >90)
Glucose, Bld: 92 mg/dL (ref 70–99)
Potassium: 4.6 mmol/L (ref 3.5–5.1)
SODIUM: 135 mmol/L (ref 135–145)

## 2015-03-02 LAB — T4, FREE: Free T4: 1.13 ng/dL (ref 0.80–1.80)

## 2015-03-02 LAB — PROTIME-INR
INR: 2.1 — AB (ref 0.00–1.49)
Prothrombin Time: 23.7 seconds — ABNORMAL HIGH (ref 11.6–15.2)

## 2015-03-02 LAB — LACTATE DEHYDROGENASE: LDH: 241 U/L (ref 94–250)

## 2015-03-02 SURGERY — ECHOCARDIOGRAM, TRANSESOPHAGEAL
Anesthesia: Monitor Anesthesia Care

## 2015-03-02 MED ORDER — LEVOTHYROXINE SODIUM 200 MCG PO TABS
200.0000 ug | ORAL_TABLET | Freq: Every day | ORAL | Status: DC
  Administered 2015-03-03 – 2015-03-05 (×3): 200 ug via ORAL
  Filled 2015-03-02 (×4): qty 1

## 2015-03-02 MED ORDER — BUTAMBEN-TETRACAINE-BENZOCAINE 2-2-14 % EX AERO
INHALATION_SPRAY | CUTANEOUS | Status: DC | PRN
  Administered 2015-03-02: 2 via TOPICAL

## 2015-03-02 MED ORDER — MIDAZOLAM HCL 5 MG/5ML IJ SOLN
INTRAMUSCULAR | Status: DC | PRN
  Administered 2015-03-02: 1 mg via INTRAVENOUS

## 2015-03-02 MED ORDER — FENTANYL CITRATE (PF) 100 MCG/2ML IJ SOLN: 25.0000 ug | INTRAMUSCULAR | Status: DC | PRN

## 2015-03-02 MED ORDER — MIDAZOLAM HCL 2 MG/2ML IJ SOLN
INTRAMUSCULAR | Status: AC
  Filled 2015-03-02: qty 2

## 2015-03-02 MED ORDER — PROPOFOL 10 MG/ML IV BOLUS
INTRAVENOUS | Status: AC
  Filled 2015-03-02: qty 20

## 2015-03-02 MED ORDER — PROPOFOL 10 MG/ML IV BOLUS
INTRAVENOUS | Status: DC | PRN
  Administered 2015-03-02 (×2): 20 mg via INTRAVENOUS
  Administered 2015-03-02: 30 mg via INTRAVENOUS

## 2015-03-02 MED ORDER — PHENYLEPHRINE 40 MCG/ML (10ML) SYRINGE FOR IV PUSH (FOR BLOOD PRESSURE SUPPORT)
PREFILLED_SYRINGE | INTRAVENOUS | Status: AC
  Filled 2015-03-02: qty 10

## 2015-03-02 MED ORDER — WARFARIN SODIUM 4 MG PO TABS
4.0000 mg | ORAL_TABLET | Freq: Once | ORAL | Status: AC
  Administered 2015-03-02: 4 mg via ORAL
  Filled 2015-03-02: qty 1

## 2015-03-02 MED ORDER — PROMETHAZINE HCL 25 MG/ML IJ SOLN
6.2500 mg | INTRAMUSCULAR | Status: DC | PRN
Start: 2015-03-02 — End: 2015-03-02

## 2015-03-02 NOTE — H&P (View-Only) (Signed)
HeartMate 2 Rounding Note  Subjective:    Admitted with nausea/vomiting and fatigue. Found to have atrial tach. Failed overdrive pacing yesterday.  INR now 2.1.    Back pain improved with neurontin    LVAD INTERROGATION:  HeartMate II LVAD:  Flow 3.6 liters/min, speed 9200, power 5.0, PI 4.5  No PI events over night.    Objective:    Vital Signs:   Temp:  [97.5 F (36.4 C)-98.2 F (36.8 C)] 97.5 F (36.4 C) (04/27 0730) Pulse Rate:  [69-73] 70 (04/27 0752) Resp:  [8-25] 14 (04/27 0752) SpO2:  [90 %-99 %] 98 % (04/27 0752) Weight:  [87.272 kg (192 lb 6.4 oz)] 87.272 kg (192 lb 6.4 oz) (04/27 0545) Last BM Date: 02/28/15 Mean arterial Pressure 76-90  Intake/Output:   Intake/Output Summary (Last 24 hours) at 03/02/15 1023 Last data filed at 03/02/15 0800  Gross per 24 hour  Intake    630 ml  Output    850 ml  Net   -220 ml     Physical Exam: General:  Well appearing. No resp difficulty. In bed.  HEENT: normal Neck: supple. JVP 7 . Carotids 2+ bilat; no bruits. No lymphadenopathy or thryomegaly appreciated. Cor: Mechanical heart sounds with LVAD hum present. Lungs: clear Abdomen: soft, nontender, nondistended. No hepatosplenomegaly. No bruits or masses. Good bowel sounds. Driveline: C/D/I; securement device intact and driveline incorporated Extremities: no cyanosis, clubbing, rash, edema RLE LLE SCDs Neuro: alert & orientedx3, cranial nerves grossly intact. moves all 4 extremities w/o difficulty. Affect pleasant  Telemetry: V paced 70s intermittent a pacing  Labs: Basic Metabolic Panel:  Recent Labs Lab 02/28/15 1306 03/01/15 0333 03/02/15 0313  NA 135  --  135  K 4.9  --  4.6  CL 102  --  101  CO2 21  --  27  GLUCOSE 110*  --  92  BUN 19  --  19  CREATININE 1.51*  --  1.49*  CALCIUM 9.0  --  8.4  MG  --  2.1  --     Liver Function Tests: No results for input(s): AST, ALT, ALKPHOS, BILITOT, PROT, ALBUMIN in the last 168 hours. No results for  input(s): LIPASE, AMYLASE in the last 168 hours. No results for input(s): AMMONIA in the last 168 hours.  CBC:  Recent Labs Lab 02/28/15 1306 03/01/15 0333 03/02/15 0313  WBC 8.0 4.6 4.8  NEUTROABS  --  2.9 3.2  HGB 12.3* 10.5* 10.1*  HCT 38.5* 33.3* 32.1*  MCV 94.6 95.1 94.7  PLT 276 226 211    INR:  Recent Labs Lab 02/28/15 1306 03/01/15 0333 03/02/15 0313  INR 1.83* 1.93* 2.10*    Other results:    Imaging: Dg Lumbar Spine 2-3 Views  03/01/2015   CLINICAL DATA:  LVAD patient. Recent fall. L4 compression fracture. Pain and soreness lower back.  EXAM: LUMBAR SPINE - 2-3 VIEW  COMPARISON:  Lumbar spine radiographs 02/20/2015 and CT abdomen pelvis 09/09/2014  FINDINGS: Five lumbar type vertebral bodies are present and normally aligned. A mild superior endplate compression deformity of the L4 vertebral body appears without significant change compared to recent lumbar spine radiographs of 02/20/2015. No evidence of progressive collapse. It is noted that the L4 vertebral body was intact and normal in height on the CT abdomen pelvis 09/09/2014. Height of the anterior aspect of the L4 vertebral body is approximately 3 cm. The adjacent L3 vertebral body measures 3.8 cm anteriorly.  Left ventricular assist device partially imaged.  Surgical clips visualized in the left upper quadrant and right upper quadrant. Visualized bowel gas pattern is nonobstructive, and there is a moderate amount of colonic stool where visualized.  IMPRESSION: Stable appearance of mild superior endplate L4 compression fracture compared to recent radiographs of 02/20/2015.   Electronically Signed   By: Curlene Dolphin M.D.   On: 03/01/2015 14:59     Medications:     Scheduled Medications: . amiodarone  400 mg Oral BID  . amLODipine  10 mg Oral Daily  . atorvastatin  80 mg Oral Daily  . docusate sodium  200 mg Oral Daily  . gabapentin  300 mg Oral QHS  . [START ON 03/03/2015] levothyroxine  200 mcg Oral QAC  breakfast  . magnesium gluconate  1,000 mg Oral QHS  . multivitamin with minerals  1 tablet Oral QHS  . pantoprazole  40 mg Oral BID  . ranolazine  500 mg Oral BID  . senna  1 tablet Oral QHS  . sodium chloride  3 mL Intravenous Q12H  . warfarin  4 mg Oral ONCE-1800  . Warfarin - Pharmacist Dosing Inpatient   Does not apply q1800    Infusions: . sodium chloride Stopped (03/02/15 0800)    PRN Medications: acetaminophen, magnesium hydroxide, ondansetron, oxyCODONE-acetaminophen, promethazine   Assessment:   1. Atrial tach 2. Nausea 3. Lumbar Compression Fracture with ongoing back pain 4. 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 H/O GI bleed 02/2015 - S/P EGD with 2 AVMs clipped in jejunum. 6. CAD 7. Paroxysmal atrial fibrillation 8. H/O 09/2015 Nephrolithiasis with J stent 9. Chronic Anticoagulation - on coumadin - Goal for INR 2-2.5    Plan/Discussion:    INR 2.1 plan for TEE /DC/CV today. Amio increased to 400 mg twice a day for 2 weeks.   Volume status ok. Hold off on diuretics. Hgb down a bit. Will follow.   Back pain improved on neurontin. Continue percocet as needed. Appreciate Dr. Trevor Mace assistance. Lumbar films show only mild compression fracture with no progression.   Consult cardiac rehab  I reviewed the LVAD parameters from today, and compared the results to the patient's prior recorded data.  No programming changes were made.  The LVAD is functioning within specified parameters.  The patient performs LVAD self-test daily.  LVAD interrogation was negative for any significant power changes, alarms or PI events/speed drops.  LVAD equipment check completed and is in good working order.  Back-up equipment present.   LVAD education done on emergency procedures and precautions and reviewed exit site care.  Length of Stay: 2  Glori Bickers MD  03/02/2015, 10:23 AM  VAD  Team --- VAD ISSUES ONLY--- Pager [phone] (7am - 7am)  Advanced Heart Failure Team  Pager [phone] (M-F; 7a - 4p)  Please contact [facility] for night-coverage after hours (4p -7a ) and weekends on [email or web address]

## 2015-03-02 NOTE — CV Procedure (Signed)
    TRANSESOPHAGEAL ECHOCARDIOGRAM GUIDED DIRECT CURRENT CARDIOVERSION  NAME:  [name]   MRN: [id number] DOB:  1941-09-14   ADMIT DATE: 02/28/2015  INDICATIONS: Atrial tachycardia   PROCEDURE:   Informed consent was obtained prior to the procedure. The risks, benefits and alternatives for the procedure were discussed and the patient comprehended these risks.  Risks include, but are not limited to, cough, sore throat, vomiting, nausea, somnolence, esophageal and stomach trauma or perforation, bleeding, low blood pressure, aspiration, pneumonia, infection, trauma to the teeth and death.    After a procedural time-out, the patient was sedated by the anesthesia service with IV propafol.  The transesophageal probe was inserted in the esophagus and stomach without difficulty and multiple views were obtained. The VAD coordinator was present during the enitre procedure and monitored his MAPs.    FINDINGS:  LEFT VENTRICLE: EF = 20% VAD cannula points posteriorly.   RIGHT VENTRICLE: Dilated. Moderate to severe HK  LEFT ATRIUM: Dilated  LEFT ATRIAL APPENDAGE: No clot  RIGHT ATRIUM: Dilated. + pacing wire  AORTIC VALVE:  Trileaflet. Does not open. No AI.   MITRAL VALVE:    Mild MR  TRICUSPID VALVE: Mild to moderate TR  PULMONIC VALVE: No PI  INTERATRIAL SEPTUM: No obvious PFO  PERICARDIUM: No effusion  DESCENDING AORTA:   CARDIOVERSION:     Procedure Details:  Once the TEE was complete, the patient had the defibrillator pads placed in the anterior and posterior position. The patient then underwent further sedation by the anesthesia service for cardioversion. Once an appropriate level of sedation was achieved, the patient received a single biphasic, synchronized 120J shock with prompt conversion to sinus rhythm. No apparent complications. The [employer] device rep was present and confirmed successful cardioversion with device interrogation.   COMPLICATIONS:    There were  no immediate complications.   CONCLUSION:   1.  Successful TEE guided cardioversion.   Arshad Oberholzer,MD 1:45 PM

## 2015-03-02 NOTE — Anesthesia Postprocedure Evaluation (Signed)
  Anesthesia Post-op Note  Patient: [name]  Procedure(s) Performed: Procedure(s) (LRB): TRANSESOPHAGEAL ECHOCARDIOGRAM (TEE) (N/A) CARDIOVERSION (N/A)  Patient Location: PACU  Anesthesia Type: MAC  Level of Consciousness: awake and alert   Airway and Oxygen Therapy: Patient Spontanous Breathing  Post-op Pain: mild  Post-op Assessment: Post-op Vital signs reviewed, Patient's Cardiovascular Status Stable, Respiratory Function Stable, Patent Airway and No signs of Nausea or vomiting  Last Vitals:  Filed Vitals:   03/02/15 1359  BP: 78/0  Pulse:   Temp: 36.7 C  Resp: 13    Post-op Vital Signs: stable   Complications: No apparent anesthesia complications

## 2015-03-02 NOTE — Progress Notes (Signed)
Echocardiogram Echocardiogram Transesophageal has been performed.  Joelene Millin 03/02/2015, 1:43 PM

## 2015-03-02 NOTE — Progress Notes (Signed)
HeartMate 2 Rounding Note  Subjective:    Admitted with nausea/vomiting and fatigue. Found to have atrial tach. Failed overdrive pacing yesterday.  INR now 2.1.    Back pain improved with neurontin    LVAD INTERROGATION:  HeartMate II LVAD:  Flow 3.6 liters/min, speed 9200, power 5.0, PI 4.5  No PI events over night.    Objective:    Vital Signs:   Temp:  [97.5 F (36.4 C)-98.2 F (36.8 C)] 97.5 F (36.4 C) (04/27 0730) Pulse Rate:  [69-73] 70 (04/27 0752) Resp:  [8-25] 14 (04/27 0752) SpO2:  [90 %-99 %] 98 % (04/27 0752) Weight:  [87.272 kg (192 lb 6.4 oz)] 87.272 kg (192 lb 6.4 oz) (04/27 0545) Last BM Date: 02/28/15 Mean arterial Pressure 76-90  Intake/Output:   Intake/Output Summary (Last 24 hours) at 03/02/15 1023 Last data filed at 03/02/15 0800  Gross per 24 hour  Intake    630 ml  Output    850 ml  Net   -220 ml     Physical Exam: General:  Well appearing. No resp difficulty. In bed.  HEENT: normal Neck: supple. JVP 7 . Carotids 2+ bilat; no bruits. No lymphadenopathy or thryomegaly appreciated. Cor: Mechanical heart sounds with LVAD hum present. Lungs: clear Abdomen: soft, nontender, nondistended. No hepatosplenomegaly. No bruits or masses. Good bowel sounds. Driveline: C/D/I; securement device intact and driveline incorporated Extremities: no cyanosis, clubbing, rash, edema RLE LLE SCDs Neuro: alert & orientedx3, cranial nerves grossly intact. moves all 4 extremities w/o difficulty. Affect pleasant  Telemetry: V paced 70s intermittent a pacing  Labs: Basic Metabolic Panel:  Recent Labs Lab 02/28/15 1306 03/01/15 0333 03/02/15 0313  NA 135  --  135  K 4.9  --  4.6  CL 102  --  101  CO2 21  --  27  GLUCOSE 110*  --  92  BUN 19  --  19  CREATININE 1.51*  --  1.49*  CALCIUM 9.0  --  8.4  MG  --  2.1  --     Liver Function Tests: No results for input(s): AST, ALT, ALKPHOS, BILITOT, PROT, ALBUMIN in the last 168 hours. No results for  input(s): LIPASE, AMYLASE in the last 168 hours. No results for input(s): AMMONIA in the last 168 hours.  CBC:  Recent Labs Lab 02/28/15 1306 03/01/15 0333 03/02/15 0313  WBC 8.0 4.6 4.8  NEUTROABS  --  2.9 3.2  HGB 12.3* 10.5* 10.1*  HCT 38.5* 33.3* 32.1*  MCV 94.6 95.1 94.7  PLT 276 226 211    INR:  Recent Labs Lab 02/28/15 1306 03/01/15 0333 03/02/15 0313  INR 1.83* 1.93* 2.10*    Other results:    Imaging: Dg Lumbar Spine 2-3 Views  03/01/2015   CLINICAL DATA:  LVAD patient. Recent fall. L4 compression fracture. Pain and soreness lower back.  EXAM: LUMBAR SPINE - 2-3 VIEW  COMPARISON:  Lumbar spine radiographs 02/20/2015 and CT abdomen pelvis 09/09/2014  FINDINGS: Five lumbar type vertebral bodies are present and normally aligned. A mild superior endplate compression deformity of the L4 vertebral body appears without significant change compared to recent lumbar spine radiographs of 02/20/2015. No evidence of progressive collapse. It is noted that the L4 vertebral body was intact and normal in height on the CT abdomen pelvis 09/09/2014. Height of the anterior aspect of the L4 vertebral body is approximately 3 cm. The adjacent L3 vertebral body measures 3.8 cm anteriorly.  Left ventricular assist device partially imaged.  Surgical clips visualized in the left upper quadrant and right upper quadrant. Visualized bowel gas pattern is nonobstructive, and there is a moderate amount of colonic stool where visualized.  IMPRESSION: Stable appearance of mild superior endplate L4 compression fracture compared to recent radiographs of 02/20/2015.   Electronically Signed   By: Curlene Dolphin M.D.   On: 03/01/2015 14:59     Medications:     Scheduled Medications: . amiodarone  400 mg Oral BID  . amLODipine  10 mg Oral Daily  . atorvastatin  80 mg Oral Daily  . docusate sodium  200 mg Oral Daily  . gabapentin  300 mg Oral QHS  . [START ON 03/03/2015] levothyroxine  200 mcg Oral QAC  breakfast  . magnesium gluconate  1,000 mg Oral QHS  . multivitamin with minerals  1 tablet Oral QHS  . pantoprazole  40 mg Oral BID  . ranolazine  500 mg Oral BID  . senna  1 tablet Oral QHS  . sodium chloride  3 mL Intravenous Q12H  . warfarin  4 mg Oral ONCE-1800  . Warfarin - Pharmacist Dosing Inpatient   Does not apply q1800    Infusions: . sodium chloride Stopped (03/02/15 0800)    PRN Medications: acetaminophen, magnesium hydroxide, ondansetron, oxyCODONE-acetaminophen, promethazine   Assessment:   1. Atrial tach 2. Nausea 3. Lumbar Compression Fracture with ongoing back pain 4. 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 H/O GI bleed 02/2015 - S/P EGD with 2 AVMs clipped in jejunum. 6. CAD 7. Paroxysmal atrial fibrillation 8. H/O 09/2015 Nephrolithiasis with J stent 9. Chronic Anticoagulation - on coumadin - Goal for INR 2-2.5    Plan/Discussion:    INR 2.1 plan for TEE /DC/CV today. Amio increased to 400 mg twice a day for 2 weeks.   Volume status ok. Hold off on diuretics. Hgb down a bit. Will follow.   Back pain improved on neurontin. Continue percocet as needed. Appreciate Dr. Trevor Mace assistance. Lumbar films show only mild compression fracture with no progression.   Consult cardiac rehab  I reviewed the LVAD parameters from today, and compared the results to the patient's prior recorded data.  No programming changes were made.  The LVAD is functioning within specified parameters.  The patient performs LVAD self-test daily.  LVAD interrogation was negative for any significant power changes, alarms or PI events/speed drops.  LVAD equipment check completed and is in good working order.  Back-up equipment present.   LVAD education done on emergency procedures and precautions and reviewed exit site care.  Length of Stay: 2  Glori Bickers MD  03/02/2015, 10:23 AM  VAD  Team --- VAD ISSUES ONLY--- Pager [phone] (7am - 7am)  Advanced Heart Failure Team  Pager [phone] (M-F; 7a - 4p)  Please contact [facility] for night-coverage after hours (4p -7a ) and weekends on [email or web address]

## 2015-03-02 NOTE — Anesthesia Preprocedure Evaluation (Addendum)
Anesthesia Evaluation  Patient identified by MRN, date of birth, ID band Patient awake    Reviewed: Allergy & Precautions, NPO status , Patient's Chart, lab work & pertinent test results  Airway Mallampati: II  TM Distance: >3 FB Neck ROM: Full    Dental no notable dental hx.    Pulmonary sleep apnea , former smoker,  breath sounds clear to auscultation  Pulmonary exam normal       Cardiovascular hypertension, + CAD, + Past MI, + Peripheral Vascular Disease and +CHF + dysrhythmias Atrial Fibrillation + Cardiac Defibrillator Rhythm:Regular Rate:Normal + Systolic murmurs LV EF: 68% -  15%  Has LVAD    Neuro/Psych TIACVA negative psych ROS   GI/Hepatic negative GI ROS, Neg liver ROS,   Endo/Other  negative endocrine ROS  Renal/GU negative Renal ROS  negative genitourinary   Musculoskeletal negative musculoskeletal ROS (+)   Abdominal   Peds negative pediatric ROS (+)  Hematology  (+) anemia ,   Anesthesia Other Findings   Reproductive/Obstetrics negative OB ROS                            Anesthesia Physical Anesthesia Plan  ASA: IV  Anesthesia Plan: MAC   Post-op Pain Management:    Induction: Intravenous  Airway Management Planned: Nasal Cannula  Additional Equipment:   Intra-op Plan:   Post-operative Plan: Extubation in OR  Informed Consent: I have reviewed the patients History and Physical, chart, labs and discussed the procedure including the risks, benefits and alternatives for the proposed anesthesia with the patient or authorized representative who has indicated his/her understanding and acceptance.   Dental advisory given  Plan Discussed with: CRNA and Surgeon  Anesthesia Plan Comments:         Anesthesia Quick Evaluation

## 2015-03-02 NOTE — Interval H&P Note (Signed)
History and Physical Interval Note:  03/02/2015 1:21 PM  [name]  has presented today for surgery, with the diagnosis of atrial tachycardia  The various methods of treatment have been discussed with the patient and family. After consideration of risks, benefits and other options for treatment, the patient has consented to  Procedure(s): TRANSESOPHAGEAL ECHOCARDIOGRAM (TEE) (N/A) CARDIOVERSION (N/A) as a surgical intervention .  The patient's history has been reviewed, patient examined, no change in status, stable for surgery.  I have reviewed the patient's chart and labs.  Questions were answered to the patient's satisfaction.     Daniel Bensimhon

## 2015-03-02 NOTE — Anesthesia Procedure Notes (Signed)
Procedure Name: MAC Date/Time: 03/02/2015 1:28 PM Performed by: Lowella Dell Pre-anesthesia Checklist: Patient identified, Timeout performed, Emergency Drugs available, Suction available and Patient being monitored Patient Re-evaluated:Patient Re-evaluated prior to inductionOxygen Delivery Method: Nasal cannula

## 2015-03-02 NOTE — Progress Notes (Signed)
[place] for coumadin Indication: LVAD  Allergies  Allergen Reactions  . Demerol [Meperidine] Other (See Comments)    Paralysis. Could only move eyes.    Patient Measurements: Height: 6' (182.9 cm) Weight: 192 lb 6.4 oz (87.272 kg) IBW/kg (Calculated) : 77.6 Vital Signs: Temp: 98.1 F (36.7 C) (04/27 0400) Temp Source: Oral (04/27 0400) Pulse Rate: 69 (04/27 0400)  Labs:  Recent Labs  02/28/15 1306 03/01/15 0333 03/02/15 0313  HGB 12.3* 10.5* 10.1*  HCT 38.5* 33.3* 32.1*  PLT 276 226 211  LABPROT 21.3* 22.2* 23.7*  INR 1.83* 1.93* 2.10*  CREATININE 1.51*  --  1.49*    Estimated Creatinine Clearance: 47 mL/min (by C-G formula based on Cr of 1.49).   Medical History: Past Medical History  Diagnosis Date  . Bradycardia   . Ventricular tachycardia     s/p AICD;   h/o ICD shock;  Amiodarone Rx. S/P ablation in 2012  . 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 HTN (hypertension)   . HLD (hyperlipidemia)   . Cytopenia   . Hypothyroidism   . Renal insufficiency   . Claudication   . Chronic systolic heart failure   . Ischemic cardiomyopathy     echo 7/12:  EF 25-30%, mild AI, mod MR, mod LAE  . TIA (transient ischemic attack) 2001  . CVD (cerebrovascular disease)     left CEA 2008  . Stroke   . Anemia   . Inappropriate therapy from implantable cardioverter-defibrillator 04/02/2013    ATP for sinus tach  SVT wavelet identified SVT but was no  passive   . Myocardial infarction 1992  . Anginal pain   . Automatic implantable cardioverter-defibrillator in situ     Medtronic Evera device serial number [id number]    . Arthritis     "a little in my knees" (05/25/2013)  . Melanoma of ear 2013    "near left ear" (05/25/2013)  . 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 Dysrhythmia     HX A FIB  .  History of nonmelanoma skin cancer     L EAR  . Kidney stone   . History of transfusion   . Sleep apnea     denies wearing mask (05/25/2013)  . CAD (coronary artery disease)     s/p MI 1982;  s/p DES to CFX 2011;  s/p NSTEMI 4/12 in setting of VTach;  cath 7/12:  LM ok, LAD mid 30-40%, Dx's with 40%, mCFX stent patent, prox to mid RCA occluded with R to R and L to R collats.  Medical Tx was continued  . CHF (congestive heart failure)     HAS VENTRICULAR PUMP    Assessment: 74 yo male with h/o VT, PAD and severe systolic HF (EF 28-36%) due to mixed ischemic/nonischemic cardiomyopathy. He is s/p HM II LVAD implant for destination therapy on October 19, 2013. On 09/01/14 underwent cystoscopy and flexible ureteroscopy with lithotripsy and basket extraction. 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 Had a fall in April that resulted in compression fracture to L4 requiring 48 hour hospital admit.  INR on admission is low at 1.8, patient has had frequent readmissions. Last INR check on 4/7 had a therapeutic INR on  4mg  on m/f and 2mg  other days.  INR now at goal 2.1. No bleeding noted, hgb stable overnight 10.1.  Plan for dccv this afternoon 4/27.  Goal of Therapy:  INR goal 2-2.5 Monitor platelets by anticoagulation protocol: Yes   Plan:  Warfarin 4mg  tonight and plan to adjust back to home dose in next 1-2d Daily INR Monitor for s/sx bleed  Erin Hearing PharmD., BCPS Clinical Pharmacist Pager [phone] 03/02/2015 9:48 AM

## 2015-03-02 NOTE — Transfer of Care (Signed)
Immediate Anesthesia Transfer of Care Note  Patient: [name]  Procedure(s) Performed: Procedure(s): TRANSESOPHAGEAL ECHOCARDIOGRAM (TEE) (N/A) CARDIOVERSION (N/A)  Patient Location: PACU  Anesthesia Type:MAC  Level of Consciousness: awake, oriented, patient cooperative and lethargic  Airway & Oxygen Therapy: Patient Spontanous Breathing and Patient connected to nasal cannula oxygen  Post-op Assessment: Report given to RN, Post -op Vital signs reviewed and stable and Patient moving all extremities X 4  Post vital signs: Reviewed and stable  Complications: No apparent anesthesia complications

## 2015-03-03 ENCOUNTER — Encounter (HOSPITAL_COMMUNITY): Payer: Self-pay | Admitting: Internal Medicine

## 2015-03-03 LAB — CBC WITH DIFFERENTIAL/PLATELET
Basophils Absolute: 0 10*3/uL (ref 0.0–0.1)
Basophils Relative: 0 % (ref 0–1)
EOS PCT: 4 % (ref 0–5)
Eosinophils Absolute: 0.3 10*3/uL (ref 0.0–0.7)
HCT: 32 % — ABNORMAL LOW (ref 39.0–52.0)
Hemoglobin: 10.1 g/dL — ABNORMAL LOW (ref 13.0–17.0)
Lymphocytes Relative: 10 % — ABNORMAL LOW (ref 12–46)
Lymphs Abs: 0.6 10*3/uL — ABNORMAL LOW (ref 0.7–4.0)
MCH: 29.6 pg (ref 26.0–34.0)
MCHC: 31.6 g/dL (ref 30.0–36.0)
MCV: 93.8 fL (ref 78.0–100.0)
Monocytes Absolute: 0.8 10*3/uL (ref 0.1–1.0)
Monocytes Relative: 13 % — ABNORMAL HIGH (ref 3–12)
NEUTROS ABS: 4.7 10*3/uL (ref 1.7–7.7)
Neutrophils Relative %: 73 % (ref 43–77)
Platelets: 199 10*3/uL (ref 150–400)
RBC: 3.41 MIL/uL — ABNORMAL LOW (ref 4.22–5.81)
RDW: 18.6 % — AB (ref 11.5–15.5)
WBC: 6.4 10*3/uL (ref 4.0–10.5)

## 2015-03-03 LAB — LACTATE DEHYDROGENASE: LDH: 292 U/L — ABNORMAL HIGH (ref 94–250)

## 2015-03-03 LAB — PROTIME-INR
INR: 2.51 — AB (ref 0.00–1.49)
Prothrombin Time: 27.3 seconds — ABNORMAL HIGH (ref 11.6–15.2)

## 2015-03-03 LAB — T3, FREE: T3, Free: 1.7 pg/mL — ABNORMAL LOW (ref 2.0–4.4)

## 2015-03-03 MED ORDER — WARFARIN SODIUM 2 MG PO TABS
2.0000 mg | ORAL_TABLET | Freq: Once | ORAL | Status: AC
  Administered 2015-03-03: 2 mg via ORAL
  Filled 2015-03-03: qty 1

## 2015-03-03 MED ORDER — ACETAMINOPHEN 325 MG PO TABS
650.0000 mg | ORAL_TABLET | ORAL | Status: DC | PRN
  Administered 2015-03-03: 650 mg via ORAL

## 2015-03-03 MED ORDER — GABAPENTIN 100 MG PO CAPS
100.0000 mg | ORAL_CAPSULE | Freq: Every day | ORAL | Status: DC
  Administered 2015-03-04 – 2015-03-05 (×2): 100 mg via ORAL
  Filled 2015-03-03 (×4): qty 1

## 2015-03-03 NOTE — Progress Notes (Signed)
HeartMate 2 Rounding Note  Subjective:    Admitted with nausea/vomiting and fatigue. Found to have atrial tach.  Has successful TEE DC-CV INR now 2.5.    Back pain improved with neurontin. Having ongoing pain with movement.     LVAD INTERROGATION:  HeartMate II LVAD:  Flow 4.6 liters/min, speed 9200, power 5.0, PI  7.3   5 PIs over night. .    Objective:    Vital Signs:   Temp:  [97.6 F (36.4 C)-98.4 F (36.9 C)] 98.2 F (36.8 C) (04/28 0721) Pulse Rate:  [70] 70 (04/28 0400) Resp:  [11-18] 18 (04/28 0721) BP: (78)/(0) 78/0 mmHg (04/27 1359) SpO2:  [92 %-100 %] 92 % (04/28 0721) Weight:  [193 lb 12.8 oz (87.907 kg)] 193 lb 12.8 oz (87.907 kg) (04/28 0322) Last BM Date: 02/28/15 Mean arterial Pressure 78-90  Intake/Output:   Intake/Output Summary (Last 24 hours) at 03/03/15 0758 Last data filed at 03/02/15 2000  Gross per 24 hour  Intake   1110 ml  Output   1050 ml  Net     60 ml     Physical Exam: General:  Well appearing. No resp difficulty. In bed.  HEENT: normal Neck: supple. JVP 7 . Carotids 2+ bilat; no bruits. No lymphadenopathy or thryomegaly appreciated. Cor: Mechanical heart sounds with LVAD hum present. Lungs: clear Abdomen: soft, nontender, nondistended. No hepatosplenomegaly. No bruits or masses. Good bowel sounds. Driveline: C/D/I; securement device intact and driveline incorporated Extremities: no cyanosis, clubbing, rash, edema RLE LLE SCDs Neuro: alert & orientedx3, cranial nerves grossly intact. moves all 4 extremities w/o difficulty. Affect pleasant  Telemetry: V paced 70s intermittent a pacing  Labs: Basic Metabolic Panel:  Recent Labs Lab 02/28/15 1306 03/01/15 0333 03/02/15 0313  NA 135  --  135  K 4.9  --  4.6  CL 102  --  101  CO2 21  --  27  GLUCOSE 110*  --  92  BUN 19  --  19  CREATININE 1.51*  --  1.49*  CALCIUM 9.0  --  8.4  MG  --  2.1  --     Liver Function Tests: No results for input(s): AST, ALT, ALKPHOS, BILITOT,  PROT, ALBUMIN in the last 168 hours. No results for input(s): LIPASE, AMYLASE in the last 168 hours. No results for input(s): AMMONIA in the last 168 hours.  CBC:  Recent Labs Lab 02/28/15 1306 03/01/15 0333 03/02/15 0313 03/03/15 0255  WBC 8.0 4.6 4.8 6.4  NEUTROABS  --  2.9 3.2 4.7  HGB 12.3* 10.5* 10.1* 10.1*  HCT 38.5* 33.3* 32.1* 32.0*  MCV 94.6 95.1 94.7 93.8  PLT 276 226 211 199    INR:  Recent Labs Lab 02/28/15 1306 03/01/15 0333 03/02/15 0313 03/03/15 0255  INR 1.83* 1.93* 2.10* 2.51*    Other results:    Imaging: Dg Lumbar Spine 2-3 Views  03/01/2015   CLINICAL DATA:  LVAD patient. Recent fall. L4 compression fracture. Pain and soreness lower back.  EXAM: LUMBAR SPINE - 2-3 VIEW  COMPARISON:  Lumbar spine radiographs 02/20/2015 and CT abdomen pelvis 09/09/2014  FINDINGS: Five lumbar type vertebral bodies are present and normally aligned. A mild superior endplate compression deformity of the L4 vertebral body appears without significant change compared to recent lumbar spine radiographs of 02/20/2015. No evidence of progressive collapse. It is noted that the L4 vertebral body was intact and normal in height on the CT abdomen pelvis 09/09/2014. Height of the anterior aspect  of the L4 vertebral body is approximately 3 cm. The adjacent L3 vertebral body measures 3.8 cm anteriorly.  Left ventricular assist device partially imaged. Surgical clips visualized in the left upper quadrant and right upper quadrant. Visualized bowel gas pattern is nonobstructive, and there is a moderate amount of colonic stool where visualized.  IMPRESSION: Stable appearance of mild superior endplate L4 compression fracture compared to recent radiographs of 02/20/2015.   Electronically Signed   By: Curlene Dolphin M.D.   On: 03/01/2015 14:59     Medications:     Scheduled Medications: . amiodarone  400 mg Oral BID  . amLODipine  10 mg Oral Daily  . atorvastatin  80 mg Oral Daily  . docusate  sodium  200 mg Oral Daily  . gabapentin  300 mg Oral QHS  . levothyroxine  200 mcg Oral QAC breakfast  . magnesium gluconate  1,000 mg Oral QHS  . multivitamin with minerals  1 tablet Oral QHS  . pantoprazole  40 mg Oral BID  . ranolazine  500 mg Oral BID  . senna  1 tablet Oral QHS  . sodium chloride  3 mL Intravenous Q12H  . Warfarin - Pharmacist Dosing Inpatient   Does not apply q1800    Infusions:    PRN Medications: acetaminophen, magnesium hydroxide, ondansetron, oxyCODONE-acetaminophen, promethazine   Assessment:   1. Atrial tach 2. Nausea 3. Lumbar Compression Fracture with ongoing back pain 4. 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 H/O GI bleed 02/2015 - S/P EGD with 2 AVMs clipped in jejunum. 6. CAD 7. Paroxysmal atrial fibrillation 8. H/O 09/2015 Nephrolithiasis with J stent 9. Chronic Anticoagulation - on coumadin - Goal for INR 2-2.5    Plan/Discussion:   S/P 4/28 TEE DC/CV Successful   INR 2.5.  Continue Amio  400 mg twice a day for 2 weeks.   Hemoglobin stable.   Volume status ok. Hold off on diuretics. Hgb down a bit. Will follow.   Back pain improved on neurontin. Continue percocet as needed. Appreciate Dr. Trevor Mace assistance. Lumbar films show only mild compression fracture with no progression. Consult PT.   Consult cardiac rehab  I reviewed the LVAD parameters from today, and compared the results to the patient's prior recorded data.  No programming changes were made.  The LVAD is functioning within specified parameters.  The patient performs LVAD self-test daily.  LVAD interrogation was negative for any significant power changes, alarms or PI events/speed drops.  LVAD equipment check completed and is in good working order.  Back-up equipment present.   LVAD education done on emergency procedures and precautions and reviewed exit site care.  Length of Stay:  3  CLEGG,AMY MD  03/03/2015, 7:58 AM  VAD Team --- VAD ISSUES ONLY--- Pager [phone] (7am - 7am)  Advanced Heart Failure Team  Pager [phone] (M-F; 7a - 4p)  Please contact [facility] for night-coverage after hours (4p -7a ) and weekends on [email or web address]   Patient seen and examined with Darrick Grinder, NP. We discussed all aspects of the encounter. I agree with the assessment and plan as stated above.   Much improved after DC-CV. Maintaining NSR. Ambulating ok. Will have PT see to consider CIR. VAD parameters stable. Will add neurontin 100 in am. Possibly home tomorrow.   Daniel Bensimhon,MD 8:16 PM

## 2015-03-03 NOTE — Progress Notes (Signed)
[place] for coumadin Indication: LVAD  Allergies  Allergen Reactions  . Demerol [Meperidine] Other (See Comments)    Paralysis. Could only move eyes.    Patient Measurements: Height: 6' (182.9 cm) Weight: 193 lb 12.8 oz (87.907 kg) IBW/kg (Calculated) : 77.6 Vital Signs: Temp: 98.2 F (36.8 C) (04/28 0721) Temp Source: Oral (04/28 0721) Pulse Rate: 70 (04/28 0400)  Labs:  Recent Labs  02/28/15 1306 03/01/15 0333 03/02/15 0313 03/03/15 0255  HGB 12.3* 10.5* 10.1* 10.1*  HCT 38.5* 33.3* 32.1* 32.0*  PLT 276 226 211 199  LABPROT 21.3* 22.2* 23.7* 27.3*  INR 1.83* 1.93* 2.10* 2.51*  CREATININE 1.51*  --  1.49*  --     Estimated Creatinine Clearance: 47 mL/min (by C-G formula based on Cr of 1.49).   Medical History: Past Medical History  Diagnosis Date  . Bradycardia   . Ventricular tachycardia     s/p AICD;   h/o ICD shock;  Amiodarone Rx. S/P ablation in 2012  . 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 HTN (hypertension)   . HLD (hyperlipidemia)   . Cytopenia   . Hypothyroidism   . Renal insufficiency   . Claudication   . Chronic systolic heart failure   . Ischemic cardiomyopathy     echo 7/12:  EF 25-30%, mild AI, mod MR, mod LAE  . TIA (transient ischemic attack) 2001  . CVD (cerebrovascular disease)     left CEA 2008  . Stroke   . Anemia   . Inappropriate therapy from implantable cardioverter-defibrillator 04/02/2013    ATP for sinus tach  SVT wavelet identified SVT but was no  passive   . Myocardial infarction 1992  . Anginal pain   . Automatic implantable cardioverter-defibrillator in situ     Medtronic Evera device serial number [id number]    . Arthritis     "a little in my knees" (05/25/2013)  . Melanoma of ear 2013    "near left ear" (05/25/2013)  . Loss of R waves on his ICD  03/30/2014  . Dysrhythmia     HX A FIB  . History of nonmelanoma skin cancer     L EAR  . Kidney stone   . History of transfusion   . Sleep apnea     denies wearing mask (05/25/2013)  . CAD (coronary artery disease)     s/p MI 1982;  s/p DES to CFX 2011;  s/p NSTEMI 4/12 in setting of VTach;  cath 7/12:  LM ok, LAD mid 30-40%, Dx's with 40%, mCFX stent patent, prox to mid RCA occluded with R to R and L to R collats.  Medical Tx was continued  . CHF (congestive heart failure)     HAS VENTRICULAR PUMP    Assessment: 74 yo male with h/o VT, PAD and severe systolic HF (EF 44-03%) due to mixed ischemic/nonischemic cardiomyopathy. He is s/p HM II LVAD implant for destination therapy on October 19, 2013. On 09/01/14 underwent cystoscopy and flexible ureteroscopy with lithotripsy and basket extraction. 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 Had a fall in April that resulted in compression fracture to L4 requiring 48 hour hospital admit.  INR on admission was low at 1.8, patient has had frequent readmissions.  Last INR check on 4/7 had a therapeutic INR on 4mg  on m/f and 2mg  other days.  INR now at goal 2.5(upper end). No bleeding noted, hgb stable overnight 10.1.  S/p DCCV 4/27  Goal of Therapy:  INR goal 2-2.5 Monitor platelets by anticoagulation protocol: Yes   Plan:  Warfarin 2mg  tonight  Daily INR Monitor for s/sx bleed  Erin Hearing PharmD., BCPS Clinical Pharmacist Pager [phone] 03/03/2015 11:00 AM

## 2015-03-04 LAB — CBC WITH DIFFERENTIAL/PLATELET
BASOS PCT: 1 % (ref 0–1)
Basophils Absolute: 0 10*3/uL (ref 0.0–0.1)
EOS ABS: 0.2 10*3/uL (ref 0.0–0.7)
Eosinophils Relative: 5 % (ref 0–5)
HCT: 30.1 % — ABNORMAL LOW (ref 39.0–52.0)
HEMOGLOBIN: 9.6 g/dL — AB (ref 13.0–17.0)
Lymphocytes Relative: 15 % (ref 12–46)
Lymphs Abs: 0.8 10*3/uL (ref 0.7–4.0)
MCH: 30 pg (ref 26.0–34.0)
MCHC: 31.9 g/dL (ref 30.0–36.0)
MCV: 94.1 fL (ref 78.0–100.0)
Monocytes Absolute: 0.9 10*3/uL (ref 0.1–1.0)
Monocytes Relative: 17 % — ABNORMAL HIGH (ref 3–12)
NEUTROS ABS: 3.3 10*3/uL (ref 1.7–7.7)
Neutrophils Relative %: 62 % (ref 43–77)
Platelets: 184 10*3/uL (ref 150–400)
RBC: 3.2 MIL/uL — ABNORMAL LOW (ref 4.22–5.81)
RDW: 18.3 % — AB (ref 11.5–15.5)
WBC: 5.3 10*3/uL (ref 4.0–10.5)

## 2015-03-04 LAB — PROTIME-INR
INR: 3.28 — ABNORMAL HIGH (ref 0.00–1.49)
PROTHROMBIN TIME: 33.6 s — AB (ref 11.6–15.2)

## 2015-03-04 LAB — BASIC METABOLIC PANEL
ANION GAP: 5 (ref 5–15)
BUN: 15 mg/dL (ref 6–23)
CO2: 28 mmol/L (ref 19–32)
CREATININE: 1.41 mg/dL — AB (ref 0.50–1.35)
Calcium: 8.6 mg/dL (ref 8.4–10.5)
Chloride: 105 mmol/L (ref 96–112)
GFR calc non Af Amer: 47 mL/min — ABNORMAL LOW (ref >90)
GFR, EST AFRICAN AMERICAN: 55 mL/min — AB (ref >90)
Glucose, Bld: 93 mg/dL (ref 70–99)
Potassium: 4.6 mmol/L (ref 3.5–5.1)
Sodium: 138 mmol/L (ref 135–145)

## 2015-03-04 LAB — LACTATE DEHYDROGENASE: LDH: 240 U/L (ref 94–250)

## 2015-03-04 NOTE — Progress Notes (Signed)
HeartMate 2 Rounding Note  Subjective:    Admitted with nausea/vomiting and fatigue. Found to have atrial tach.  Has successful TEE DC-CV INR now 3.28 .    Difficult walking due to back pain from acute compression fracture. Unsteady on his feet. Complaining of weakness.  Denies SOB  LVAD INTERROGATION:  HeartMate II LVAD:  Flow 4.7 liters/min, speed 9200, power 5.0, PI  6.8  Rare PIs over night. .    Objective:    Vital Signs:   Temp:  [98 F (36.7 C)-98.3 F (36.8 C)] 98.3 F (36.8 C) (04/29 0723) Pulse Rate:  [70] 70 (04/29 0723) Resp:  [15-18] 18 (04/29 0723) SpO2:  [93 %-96 %] 94 % (04/29 0723) Weight:  [192 lb 9.6 oz (87.363 kg)] 192 lb 9.6 oz (87.363 kg) (04/29 0300) Last BM Date: 03/03/15 Mean arterial Pressure 78-96  Intake/Output:   Intake/Output Summary (Last 24 hours) at 03/04/15 0736 Last data filed at 03/04/15 0724  Gross per 24 hour  Intake    600 ml  Output   1625 ml  Net  -1025 ml     Physical Exam: General:  . No resp difficulty. In bed.  HEENT: normal Neck: supple. JVP  5-6 . Carotids 2+ bilat; no bruits. No lymphadenopathy or thryomegaly appreciated. Cor: Mechanical heart sounds with LVAD hum present. Lungs: clear Abdomen: soft, nontender, nondistended. No hepatosplenomegaly. No bruits or masses. Good bowel sounds. Driveline: C/D/I; securement device intact and driveline incorporated Extremities: no cyanosis, clubbing, rash, edema RLE LLE SCDs Neuro: alert & orientedx3, cranial nerves grossly intact. moves all 4 extremities w/o difficulty. Affect pleasant  Telemetry: AV paced 70s  Labs: Basic Metabolic Panel:  Recent Labs Lab 02/28/15 1306 03/01/15 0333 03/02/15 0313 03/04/15 0241  NA 135  --  135 138  K 4.9  --  4.6 4.6  CL 102  --  101 105  CO2 21  --  27 28  GLUCOSE 110*  --  92 93  BUN 19  --  19 15  CREATININE 1.51*  --  1.49* 1.41*  CALCIUM 9.0  --  8.4 8.6  MG  --  2.1  --   --     Liver Function Tests: No results for  input(s): AST, ALT, ALKPHOS, BILITOT, PROT, ALBUMIN in the last 168 hours. No results for input(s): LIPASE, AMYLASE in the last 168 hours. No results for input(s): AMMONIA in the last 168 hours.  CBC:  Recent Labs Lab 02/28/15 1306 03/01/15 0333 03/02/15 0313 03/03/15 0255 03/04/15 0241  WBC 8.0 4.6 4.8 6.4 5.3  NEUTROABS  --  2.9 3.2 4.7 3.3  HGB 12.3* 10.5* 10.1* 10.1* 9.6*  HCT 38.5* 33.3* 32.1* 32.0* 30.1*  MCV 94.6 95.1 94.7 93.8 94.1  PLT 276 226 211 199 184    INR:  Recent Labs Lab 02/28/15 1306 03/01/15 0333 03/02/15 0313 03/03/15 0255 03/04/15 0241  INR 1.83* 1.93* 2.10* 2.51* 3.28*    Other results:    Imaging: No results found.   Medications:     Scheduled Medications: . amiodarone  400 mg Oral BID  . amLODipine  10 mg Oral Daily  . atorvastatin  80 mg Oral Daily  . docusate sodium  200 mg Oral Daily  . gabapentin  100 mg Oral Daily  . gabapentin  300 mg Oral QHS  . levothyroxine  200 mcg Oral QAC breakfast  . magnesium gluconate  1,000 mg Oral QHS  . multivitamin with minerals  1 tablet Oral QHS  .  pantoprazole  40 mg Oral BID  . ranolazine  500 mg Oral BID  . senna  1 tablet Oral QHS  . sodium chloride  3 mL Intravenous Q12H  . Warfarin - Pharmacist Dosing Inpatient   Does not apply q1800    Infusions:    PRN Medications: acetaminophen, magnesium hydroxide, ondansetron, oxyCODONE-acetaminophen, promethazine   Assessment:   1. Atrial tach 2. Nausea 3. Lumbar Compression Fracture with ongoing back pain 4. 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 H/O GI bleed 02/2015 - S/P EGD with 2 AVMs clipped in jejunum. 6. CAD 7. Paroxysmal atrial fibrillation 8. H/O 09/2015 Nephrolithiasis with J stent 9. Chronic Anticoagulation - on coumadin - Goal for INR 2-2.5  10. Physical deconditioning  Plan/Discussion:   S/P 4/28 TEE DC/CV Successful   INR  3.28 .   Continue Amio  400 mg twice a day for 2 weeks.   Hemoglobin stable.   Volume status ok. Hold off on diuretics. Hgb down a bit. Will follow. .   Back pain improved on neurontin. Continue percocet as needed. Appreciate Dr. Trevor Mace assistance. Lumbar films show only mild compression fracture with no progression. Pain better controlled with neurontin. Consult PT.  May need rehab.   I reviewed the LVAD parameters from today, and compared the results to the patient's prior recorded data.  No programming changes were made.  The LVAD is functioning within specified parameters.  The patient performs LVAD self-test daily.  LVAD interrogation was negative for any significant power changes, alarms or PI events/speed drops.  LVAD equipment check completed and is in good working order.  Back-up equipment present.   LVAD education done on emergency procedures and precautions and reviewed exit site care.  Length of Stay: 4  CLEGG,AMY NP-C 03/04/2015, 7:36 AM  VAD Team --- VAD ISSUES ONLY--- Pager [phone] (7am - 7am)  Advanced Heart Failure Team  Pager [phone] (M-F; 7a - 4p)  Please contact [facility] for night-coverage after hours (4p -7a ) and weekends on [email or web address]  Patient seen and examined with Darrick Grinder, NP. We discussed all aspects of the encounter. I agree with the assessment and plan as stated above.   Remains in NSR. HF stable. Main issue is back pain and unsteady gait. With multiple recent hospitalizations, I think he would benefit from CIR to improve his strength and prevent readmission. Continue amio 400 bid for 2 weeks then back down to 200 daily.   VAD parameters stable.   Daniel Bensimhon,MD 8:13 AM

## 2015-03-04 NOTE — Progress Notes (Signed)
Thank you for consult on [name]. PT evaluation was done this am revealing patient to be at supervision for transfers and min/guard assist to ambulate 500' with RW. Anticipate that patient will progress to supervision level with therapy on acute over the next 1-2 days. Will defer CIR consult for now.

## 2015-03-04 NOTE — Progress Notes (Signed)
[place] for coumadin Indication: LVAD  Allergies  Allergen Reactions  . Demerol [Meperidine] Other (See Comments)    Paralysis. Could only move eyes.    Patient Measurements: Height: 6' (182.9 cm) Weight: 192 lb 9.6 oz (87.363 kg) IBW/kg (Calculated) : 77.6 Vital Signs: Temp: 98.3 F (36.8 C) (04/29 0723) Temp Source: Oral (04/29 0723) Pulse Rate: 70 (04/29 0723)  Labs:  Recent Labs  03/02/15 0313 03/03/15 0255 03/04/15 0241  HGB 10.1* 10.1* 9.6*  HCT 32.1* 32.0* 30.1*  PLT 211 199 184  LABPROT 23.7* 27.3* 33.6*  INR 2.10* 2.51* 3.28*  CREATININE 1.49*  --  1.41*    Estimated Creatinine Clearance: 49.7 mL/min (by C-G formula based on Cr of 1.41).   Medical History: Past Medical History  Diagnosis Date  . Bradycardia   . Ventricular tachycardia     s/p AICD;   h/o ICD shock;  Amiodarone Rx. S/P ablation in 2012  . 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 HTN (hypertension)   . HLD (hyperlipidemia)   . Cytopenia   . Hypothyroidism   . Renal insufficiency   . Claudication   . Chronic systolic heart failure   . Ischemic cardiomyopathy     echo 7/12:  EF 25-30%, mild AI, mod MR, mod LAE  . TIA (transient ischemic attack) 2001  . CVD (cerebrovascular disease)     left CEA 2008  . Stroke   . Anemia   . Inappropriate therapy from implantable cardioverter-defibrillator 04/02/2013    ATP for sinus tach  SVT wavelet identified SVT but was no  passive   . Myocardial infarction 1992  . Anginal pain   . Automatic implantable cardioverter-defibrillator in situ     Medtronic Evera device serial number [id number]    . Arthritis     "a little in my knees" (05/25/2013)  . Melanoma of ear 2013    "near left ear" (05/25/2013)  . 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 Dysrhythmia     HX A FIB  .  History of nonmelanoma skin cancer     L EAR  . Kidney stone   . History of transfusion   . Sleep apnea     denies wearing mask (05/25/2013)  . CAD (coronary artery disease)     s/p MI 1982;  s/p DES to CFX 2011;  s/p NSTEMI 4/12 in setting of VTach;  cath 7/12:  LM ok, LAD mid 30-40%, Dx's with 40%, mCFX stent patent, prox to mid RCA occluded with R to R and L to R collats.  Medical Tx was continued  . CHF (congestive heart failure)     HAS VENTRICULAR PUMP    Assessment: 74 yo male with h/o VT, PAD and severe systolic HF (EF 03-50%) due to mixed ischemic/nonischemic cardiomyopathy. He is s/p HM II LVAD implant for destination therapy on October 19, 2013. On 09/01/14 underwent cystoscopy and flexible ureteroscopy with lithotripsy and basket extraction. 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 Had a fall in April that resulted in compression fracture to L4 requiring 48 hour hospital admit.  INR on admission was low at 1.8, patient has had frequent readmissions. Last INR check on 4/7 had a therapeutic INR on  4mg  on m/f and 2mg  other days.  Gave extra warfarin the past few days and now INR now at goal 3.2. No bleeding noted, hgb down overnight 9.6.  S/p DCCV 4/27 - currently a-paced  Goal of Therapy:  INR goal 2-2.5 Monitor platelets by anticoagulation protocol: Yes   Plan:  Hold Warfarin tonight Daily INR Monitor for s/sx bleed  Erin Hearing PharmD., BCPS Clinical Pharmacist Pager [phone] 03/04/2015 7:35 AM

## 2015-03-04 NOTE — Evaluation (Signed)
Physical Therapy Evaluation Patient Details Name: [name] MRN: [id number] DOB: 07-28-1941 Today's Date: 03/04/2015   History of Present Illness  pt admitted with N/V atrial tach with L4 compression fx after recent GIB, PMHx LVAD 12/14, CHF  Clinical Impression  Pt very pleasant and moving well but reportedly has been off balance with decreased functional mobility x 1 week after recent falls, fx and GIB. Pt normally independent and working and want to return to his PLOF. Pt with decreased balance, gait and adherence to precautions who will benefit from acute therapy to maximize mobility, function and gait to decrease burden of care. Pt with several recent admissions with caregiver concern over pt going home and with given current functional status feel short term stay on CIR would be highly beneficial to maximize pt function and independence prior to home. Will follow acutely.     Follow Up Recommendations CIR    Equipment Recommendations  None recommended by PT    Recommendations for Other Services       Precautions / Restrictions Precautions Precautions: Fall;Other (comment);Back Precaution Booklet Issued: Yes (comment) Precaution Comments: LVAD      Mobility  Bed Mobility Overal bed mobility: Needs Assistance Bed Mobility: Rolling;Sidelying to Sit;Sit to Sidelying Rolling: Supervision Sidelying to sit: Supervision     Sit to sidelying: Supervision General bed mobility comments: cues for technique and precautions with practice x 2  Transfers Overall transfer level: Needs assistance   Transfers: Sit to/from Stand Sit to Stand: Supervision         General transfer comment: cues for hand placement  Ambulation/Gait Ambulation/Gait assistance: Min guard Ambulation Distance (Feet): 500 Feet Assistive device: Rolling walker (2 wheeled) Gait Pattern/deviations: Step-through pattern;Decreased stride length;Wide base of support   Gait velocity interpretation:  Below normal speed for age/gender General Gait Details: pt with LOB x 1 when attempting to lift arm off walker to give RN a high five and another partial LOB with walking able to recover both without assist but guarding for safety. Walked 35' without Rw with significantly improved balance with use of RW  Stairs            Wheelchair Mobility    Modified Rankin (Stroke Patients Only)       Balance Overall balance assessment: Needs assistance   Sitting balance-Leahy Scale: Good       Standing balance-Leahy Scale: Fair                               Pertinent Vitals/Pain Pain Score: 5  Pain Location: back pain Pain Intervention(s): Limited activity within patient's tolerance;Repositioned    Home Living Family/patient expects to be discharged to:: Private residence Living Arrangements: Spouse/significant other Available Help at Discharge: Family;Available PRN/intermittently Type of Home: House Home Access: Stairs to enter   Entrance Stairs-Number of Steps: 1 Home Layout: Two level;Able to live on main level with bedroom/bathroom Home Equipment: Walker - 4 wheels;Cane - single point;Shower seat;Hand held Tourist information centre manager - 2 wheels;Bedside commode      Prior Function Level of Independence: Independent         Comments: working part-time Presenter, broadcasting at [employer]; 5 hrs/day x 5 days/week. Pt normally independent, has been using RW since pain with back and increased unsteadiness at least a week     Hand Dominance        Extremity/Trunk Assessment   Upper Extremity Assessment: Overall WFL for tasks assessed  Lower Extremity Assessment: Overall WFL for tasks assessed      Cervical / Trunk Assessment: Kyphotic  Communication   Communication: No difficulties  Cognition Arousal/Alertness: Awake/alert Behavior During Therapy: WFL for tasks assessed/performed Overall Cognitive Status: Within Functional Limits for tasks  assessed                      General Comments      Exercises        Assessment/Plan    PT Assessment Patient needs continued PT services  PT Diagnosis Abnormality of gait;Acute pain   PT Problem List Decreased activity tolerance;Decreased mobility;Decreased knowledge of use of DME;Decreased knowledge of precautions;Pain;Decreased balance  PT Treatment Interventions DME instruction;Gait training;Functional mobility training;Therapeutic activities;Patient/family education;Balance training   PT Goals (Current goals can be found in the Care Plan section) Acute Rehab PT Goals Patient Stated Goal: get rid of pain, improve balance and go back to work PT Goal Formulation: With patient/family Time For Goal Achievement: 03/18/15 Potential to Achieve Goals: Good    Frequency Min 3X/week   Barriers to discharge Decreased caregiver support significant other works and is not available during the day    Co-evaluation               End of Session Equipment Utilized During Treatment: Gait belt   Patient left: in chair;with call bell/phone within reach;with family/visitor present Nurse Communication: Mobility status;Precautions         Time: [phone] PT Time Calculation (min) (ACUTE ONLY): 31 min   Charges:   PT Evaluation $Initial PT Evaluation Tier I: 1 Procedure PT Treatments $Therapeutic Activity: 8-22 mins   PT G CodesMelford Aase 03/04/2015, 9:25 AM Elwyn Reach, [place]

## 2015-03-05 LAB — LACTATE DEHYDROGENASE: LDH: 247 U/L (ref 94–250)

## 2015-03-05 LAB — BASIC METABOLIC PANEL
ANION GAP: 6 (ref 5–15)
BUN: 17 mg/dL (ref 6–23)
CALCIUM: 8.6 mg/dL (ref 8.4–10.5)
CO2: 28 mmol/L (ref 19–32)
CREATININE: 1.47 mg/dL — AB (ref 0.50–1.35)
Chloride: 103 mmol/L (ref 96–112)
GFR calc non Af Amer: 45 mL/min — ABNORMAL LOW (ref >90)
GFR, EST AFRICAN AMERICAN: 52 mL/min — AB (ref >90)
Glucose, Bld: 101 mg/dL — ABNORMAL HIGH (ref 70–99)
Potassium: 4.6 mmol/L (ref 3.5–5.1)
Sodium: 137 mmol/L (ref 135–145)

## 2015-03-05 LAB — CBC WITH DIFFERENTIAL/PLATELET
Basophils Absolute: 0 10*3/uL (ref 0.0–0.1)
Basophils Relative: 1 % (ref 0–1)
EOS PCT: 5 % (ref 0–5)
Eosinophils Absolute: 0.3 10*3/uL (ref 0.0–0.7)
HEMATOCRIT: 31.5 % — AB (ref 39.0–52.0)
Hemoglobin: 10.1 g/dL — ABNORMAL LOW (ref 13.0–17.0)
Lymphocytes Relative: 16 % (ref 12–46)
Lymphs Abs: 0.8 10*3/uL (ref 0.7–4.0)
MCH: 30 pg (ref 26.0–34.0)
MCHC: 32.1 g/dL (ref 30.0–36.0)
MCV: 93.5 fL (ref 78.0–100.0)
MONO ABS: 1 10*3/uL (ref 0.1–1.0)
Monocytes Relative: 19 % — ABNORMAL HIGH (ref 3–12)
NEUTROS ABS: 3.2 10*3/uL (ref 1.7–7.7)
Neutrophils Relative %: 59 % (ref 43–77)
Platelets: 199 10*3/uL (ref 150–400)
RBC: 3.37 MIL/uL — ABNORMAL LOW (ref 4.22–5.81)
RDW: 18.3 % — AB (ref 11.5–15.5)
WBC: 5.3 10*3/uL (ref 4.0–10.5)

## 2015-03-05 LAB — PROTIME-INR
INR: 2.66 — ABNORMAL HIGH (ref 0.00–1.49)
Prothrombin Time: 28.6 seconds — ABNORMAL HIGH (ref 11.6–15.2)

## 2015-03-05 MED ORDER — AMIODARONE HCL 200 MG PO TABS: ORAL_TABLET | ORAL | Status: DC

## 2015-03-05 MED ORDER — GABAPENTIN 300 MG PO CAPS: 300.0000 mg | ORAL_CAPSULE | Freq: Two times a day (BID) | ORAL | Status: DC

## 2015-03-05 MED ORDER — WARFARIN SODIUM 1 MG PO TABS
1.0000 mg | ORAL_TABLET | Freq: Once | ORAL | Status: AC
  Administered 2015-03-05: 1 mg via ORAL
  Filled 2015-03-05: qty 1

## 2015-03-05 NOTE — Discharge Instructions (Signed)
Your amiodarone has been increased to 400mg  twice a day, you will need to take this higher dose for 2 weeks before dropping back down to 200mg  daily. New medication also include Neurontin 300mg  BID to help with back pain.

## 2015-03-05 NOTE — Progress Notes (Signed)
ANTICOAGULATION CONSULT NOTE - Follow Up Consult  Pharmacy Consult for warfarin Indication: LVAD  Allergies  Allergen Reactions  . Demerol [Meperidine] Other (See Comments)    Paralysis. Could only move eyes.    Patient Measurements: Height: 6' (182.9 cm) Weight: 194 lb 0.1 oz (88 kg) (with LVAD controller) IBW/kg (Calculated) : 77.6  Vital Signs: Temp: 98.1 F (36.7 C) (04/30 1133) Temp Source: Oral (04/30 1133)  Labs:  Recent Labs  03/03/15 0255 03/04/15 0241 03/05/15 0259  HGB 10.1* 9.6* 10.1*  HCT 32.0* 30.1* 31.5*  PLT 199 184 199  LABPROT 27.3* 33.6* 28.6*  INR 2.51* 3.28* 2.66*  CREATININE  --  1.41* 1.47*    Estimated Creatinine Clearance: 47.7 mL/min (by C-G formula based on Cr of 1.47).   Medications:  Scheduled:  . amiodarone  400 mg Oral BID  . amLODipine  10 mg Oral Daily  . atorvastatin  80 mg Oral Daily  . docusate sodium  200 mg Oral Daily  . gabapentin  100 mg Oral Daily  . gabapentin  300 mg Oral QHS  . levothyroxine  200 mcg Oral QAC breakfast  . magnesium gluconate  1,000 mg Oral QHS  . multivitamin with minerals  1 tablet Oral QHS  . pantoprazole  40 mg Oral BID  . ranolazine  500 mg Oral BID  . senna  1 tablet Oral QHS  . sodium chloride  3 mL Intravenous Q12H  . Warfarin - Pharmacist Dosing Inpatient   Does not apply q1800    Assessment: 74 yo m with h/o VT, PAD and severe systolic HF (EF 28-76%). He is s/p LVAD implant in December of 2014. Pharmacy is consulted to resume warfarin.  Dose was held yesterday due to high INR (3.2).  INR this AM is 2.6 (goal 2-2.5).  Since patient is high risk, will give a small dose tonight.   Goal of Therapy:  INR 2-2.5 Monitor platelets by anticoagulation protocol: Yes   Plan:  Warfarin 1 mg x 1 tonight Daily INR F/u INR in AM to determine further dosing Monitor hgb/plts, s/s of bleeding, clinical course  Cassie L. Nicole Kindred, PharmD Clinical Pharmacy Resident Pager: [phone] 03/05/2015 12:55  PM

## 2015-03-05 NOTE — Progress Notes (Signed)
Patient transported via wheelchair to vehicle discharging home with s/o.  Patient currently awake alert and oriented.  Discharge instructions given and all questions and concerns addressed.  Lt. PIV d/c'd with pressure dressing to site.  LVAD home pack taken with patient.  LVAD site unremarkable at time of discharge.

## 2015-03-05 NOTE — Progress Notes (Signed)
Notified Geri Seminole, Case Mgt. regarding Home Health assistance.  Per Judson Roch, [facility] is already available for him and she will notify them of his d/c today.

## 2015-03-05 NOTE — Discharge Summary (Signed)
Discharge Summary   Patient ID: [name],  MRN: [id number], DOB/AGE: 1941-02-10 74 y.o.  Admit date: 02/28/2015 Discharge date: 03/05/2015  Primary Care Provider: [name] Primary Cardiologist: Dr. Haroldine Laws  Discharge Diagnoses Principal Problem:   Atrial tachycardia Active Problems:   Automatic implantable cardioverter-defibrillator in situ   PVD (peripheral vascular disease)   HTN (hypertension)   HLD (hyperlipidemia)   CAD (coronary artery disease)   LVAD (left ventricular assist device) present   Compression fracture of L4 lumbar vertebra   Nausea & vomiting   Allergies Allergies  Allergen Reactions  . Demerol [Meperidine] Other (See Comments)    Paralysis. Could only move eyes.    Procedures  TEE DCCV 03/02/2015  FINDINGS:  LEFT VENTRICLE: EF = 20% VAD cannula points posteriorly.   RIGHT VENTRICLE: Dilated. Moderate to severe HK  LEFT ATRIUM: Dilated  LEFT ATRIAL APPENDAGE: No clot  RIGHT ATRIUM: Dilated. + pacing wire  AORTIC VALVE: Trileaflet. Does not open. No AI.   MITRAL VALVE: Mild MR  TRICUSPID VALVE: Mild to moderate TR  PULMONIC VALVE: No PI  INTERATRIAL SEPTUM: No obvious PFO  PERICARDIUM: No effusion  DESCENDING AORTA:   CARDIOVERSION:   Procedure Details:  Once the TEE was complete, the patient had the defibrillator pads placed in the anterior and posterior position. The patient then underwent further sedation by the anesthesia service for cardioversion. Once an appropriate level of sedation was achieved, the patient received a single biphasic, synchronized 120J shock with prompt conversion to sinus rhythm. No apparent complications. The Medtr[employer] device rep was present and confirmed successful cardioversion with device interrogation.   COMPLICATIONS:   There were no immediate complications.   CONCLUSION:   1. Successful TEE guided cardioversion.    Hospital Course  The patient is a pleasant  74 year old male with history of DVT, PAD, and severe systolic heart failure with EF 20-25% due to mixed ischemic/nonischemic cardiomyopathy. He is status post HeartMate II LVAD implant by Dr. Cyndia Bent for destination therapy on 10/19/2013. He had cystoscopy and a flexible ureteroscopy on 09/01/2614 with lithotripsy and basket extraction. 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 He required several units of packed red blood cell transfusion at the time. He presented to [facility] on 02/28/2015 with nausea, vomiting, poor appetite and fatigue. He also had multiple episodes of presyncope. LVAD was interrogated which showed flow of 4.7 L/m, speed 9200, power 3.6, PI 6.1. Multiple PICU events. His ICD was also interrogated in the ED which showed recurrent atrial tachycardia the last 24 hours however no VT. Volume status was up but now back down on Optivol. He had multiple attempts at overdrive pacing in the ED which were unsuccessful.  He was admitted by heart failure service, from heart failure perspective, his volume status appears to be low therefore diuretic was held. Creatinine trended up slightly to 1.5. His LVAD parameters was reviewed and no change was made. LVAD appears to be functioning within specified parameter. The patient has been performing LVAD self-test daily. Interrogation did not reveal any significant power changes, large PI events or speed drops. Given his failed overdrive pacing for tachycardia in the ER, plan was made for TEE DC cardioversion once INR is greater than 2.0. His amiodarone was increased to 400 mg twice a day for 2 weeks. Post admission, patient had significant back pain. Of note, he was recently diagnosed with L4 lumbar compression fracture and was placed on conservative therapy. Dr.  Ninfa Linden with orthopedic was contacted. Neurontin was added for pain control along with home percocet. His spine film was repeated and reviewed by orthopedic, his  L4 compression fracture appears to be stable with no interval changes. No further recommendation by orthopedic. His nausea improved during hospitalization.  Upon reaching therapeutic INR level, patient underwent TEE cardioversion on 03/02/2015 which showed EF 20% with cannula points posteriorly, dilated right ventricle with moderate to severe hypokinesis, he underwent single biphasic synchronized 120 J shock with prompt conversion to sinus rhythm. Post procedure, there was no apparent complication. Med[employer] device rep was present and confirms successful cardioversion with device interrogation. Physical therapy was consulted for consideration of inpatient cardiac rehabilitation. He was seen by CRI PA who feels he will likely progress over the next few days to [facility] level after speaking with patient, he used advanced home care in the past and would like to use them for [facility]. Advanced home care was set up for the patient. He was seen the morning of 03/05/2015, he is deemed stable for discharge from heart failure perspective. His Neurontin was increased to 300 mg twice a day. Close follow-up with VAD clinic has been previously scheduled.   Discharge Vitals Blood pressure 78/0, pulse 70, temperature 98.1 F (36.7 C), temperature source Oral, resp. rate 16, height 6' (1.829 m), weight 194 lb 0.1 oz (88 kg), SpO2 94 %.  Filed Weights   03/03/15 0322 03/04/15 0300 03/05/15 0400  Weight: 193 lb 12.8 oz (87.907 kg) 192 lb 9.6 oz (87.363 kg) 194 lb 0.1 oz (88 kg)    Labs  CBC  Recent Labs  03/04/15 0241 03/05/15 0259  WBC 5.3 5.3  NEUTROABS 3.3 3.2  HGB 9.6* 10.1*  HCT 30.1* 31.5*  MCV 94.1 93.5  PLT 184 267   Basic Metabolic Panel  Recent Labs  03/04/15 0241 03/05/15 0259  NA 138 137  K 4.6 4.6  CL 105 103  CO2 28 28  GLUCOSE 93 101*  BUN 15 17  CREATININE 1.41* 1.47*  CALCIUM 8.6 8.6    Disposition  Pt is being discharged home today in good condition.  Follow-up Plans &  Appointments      Follow-up Information    Follow up with [facility].   Why:  hhrn and hhpt, will call 24-48hrs after disch to set up appt.   Contact information:   [address] [place] [address] [phone]       Follow up with [facility] On 03/10/2015.   Specialty:  Cardiology   Why:  2:00pm. VAD clinic followup   Contact information:   [address] [id number] [place] [phone]      Discharge Medications    Medication List    TAKE these medications        acetaminophen 325 MG tablet  Commonly known as:  TYLENOL  Take 650 mg by mouth every 6 (six) hours as needed (pain).     amiodarone 200 MG tablet  Commonly known as:  PACERONE  400mg  BID for 2 weeks before drop down to 200mg  daily afterward     amLODipine 10 MG tablet  Commonly known as:  NORVASC  Take 10 mg by mouth daily.     atorvastatin 80 MG tablet  Commonly known as:  LIPITOR  Take 80 mg by mouth daily.     docusate sodium 100 MG capsule  Commonly known as:  COLACE  Take  200 mg by mouth daily.     furosemide 20 MG tablet  Commonly known as:  LASIX  Take 20 mg by mouth daily as needed for fluid.     gabapentin 300 MG capsule  Commonly known as:  NEURONTIN  Take 1 capsule (300 mg total) by mouth 2 (two) times daily.     ICAPS PO  Take 1 tablet by mouth 2 (two) times daily.     levothyroxine 175 MCG tablet  Commonly known as:  SYNTHROID, LEVOTHROID  Take 1 tablet (175 mcg total) by mouth daily before breakfast.     magnesium gluconate 500 MG tablet  Commonly known as:  MAGONATE  Take 1,000 mg by mouth at bedtime.     multivitamin with minerals Tabs tablet  Take 1 tablet by mouth at bedtime.     nitroGLYCERIN 0.4 MG SL tablet  Commonly known as:  NITROSTAT  Place 0.4 mg under the tongue every 5 (five) minutes as needed for chest pain.     oxyCODONE-acetaminophen 5-325 MG  per tablet  Commonly known as:  PERCOCET/ROXICET  Take 1-2 tablets by mouth every 4 (four) hours as needed for moderate pain.     pantoprazole 40 MG tablet  Commonly known as:  PROTONIX  Take 1 tablet (40 mg total) by mouth 2 (two) times daily.     promethazine 12.5 MG tablet  Commonly known as:  PHENERGAN  Take 1 tablet (12.5 mg total) by mouth every 6 (six) hours as needed for nausea or vomiting.     RANEXA 500 MG 12 hr tablet  Generic drug:  ranolazine  TAKE 1 TABLET (500 MG TOTAL) BY MOUTH 2 (TWO) TIMES DAILY.     warfarin 2 MG tablet  Commonly known as:  COUMADIN  Take 2mg  daily, except takes 4mg  on Monday and Friday.     zinc gluconate 50 MG tablet  Take 100 mg by mouth at bedtime.        Duration of Discharge Encounter   Greater than 30 minutes including physician time.  Hilbert Corrigan PA-C Pager: [phone] 03/05/2015, 12:11 PM   Patient seen and examined with Almyra Deforest, PA-C. We discussed all aspects of the encounter. I agree with the assessment and plan as stated above.  Improved after DC-CV. Can go home today.  Yakir Wenke,MD 4:13 PM

## 2015-03-05 NOTE — Progress Notes (Signed)
HeartMate 2 Rounding Note  Subjective:    Admitted with nausea/vomiting and fatigue. Found to have atrial tach.  Has successful TEE DC-CV this admit  Walking better. Felt not to be candidate for CIR. Wants to go home. Mild bout of nausea this am. Now better. Still with back pain.   Denies SOB  LVAD INTERROGATION:  HeartMate II LVAD:  Flow 4.1  liters/min, speed 9200, power 5.0, PI  6.7  Rare PIs over night. .    Objective:    Vital Signs:   Temp:  [97.8 F (36.6 C)-98.3 F (36.8 C)] 98.1 F (36.7 C) (04/30 1133) Pulse Rate:  [69-70] 70 (04/29 2325) Resp:  [16-19] 16 (04/30 0731) SpO2:  [91 %-96 %] 94 % (04/30 1133) Weight:  [88 kg (194 lb 0.1 oz)] 88 kg (194 lb 0.1 oz) (04/30 0400) Last BM Date: 03/03/15 Mean arterial Pressure 88-100  Intake/Output:   Intake/Output Summary (Last 24 hours) at 03/05/15 1144 Last data filed at 03/05/15 1000  Gross per 24 hour  Intake    925 ml  Output   1825 ml  Net   -900 ml     Physical Exam: General:  . No resp difficulty. In bed.  HEENT: normal Neck: supple. JVP  5-6 . Carotids 2+ bilat; no bruits. No lymphadenopathy or thryomegaly appreciated. Cor: Mechanical heart sounds with LVAD hum present. Lungs: clear Abdomen: soft, nontender, nondistended. No hepatosplenomegaly. No bruits or masses. Good bowel sounds. Driveline: C/D/I; securement device intact and driveline incorporated Extremities: no cyanosis, clubbing, rash, edema RLE LLE SCDs Neuro: alert & orientedx3, cranial nerves grossly intact. moves all 4 extremities w/o difficulty. Affect pleasant  Telemetry: A paced 70s  Labs: Basic Metabolic Panel:  Recent Labs Lab 02/28/15 1306 03/01/15 0333 03/02/15 0313 03/04/15 0241 03/05/15 0259  NA 135  --  135 138 137  K 4.9  --  4.6 4.6 4.6  CL 102  --  101 105 103  CO2 21  --  27 28 28   GLUCOSE 110*  --  92 93 101*  BUN 19  --  19 15 17   CREATININE 1.51*  --  1.49* 1.41* 1.47*  CALCIUM 9.0  --  8.4 8.6 8.6  MG  --  2.1   --   --   --     Liver Function Tests: No results for input(s): AST, ALT, ALKPHOS, BILITOT, PROT, ALBUMIN in the last 168 hours. No results for input(s): LIPASE, AMYLASE in the last 168 hours. No results for input(s): AMMONIA in the last 168 hours.  CBC:  Recent Labs Lab 03/01/15 0333 03/02/15 0313 03/03/15 0255 03/04/15 0241 03/05/15 0259  WBC 4.6 4.8 6.4 5.3 5.3  NEUTROABS 2.9 3.2 4.7 3.3 3.2  HGB 10.5* 10.1* 10.1* 9.6* 10.1*  HCT 33.3* 32.1* 32.0* 30.1* 31.5*  MCV 95.1 94.7 93.8 94.1 93.5  PLT 226 211 199 184 199    INR:  Recent Labs Lab 03/01/15 0333 03/02/15 0313 03/03/15 0255 03/04/15 0241 03/05/15 0259  INR 1.93* 2.10* 2.51* 3.28* 2.66*    Other results:    Imaging: No results found.   Medications:     Scheduled Medications: . amiodarone  400 mg Oral BID  . amLODipine  10 mg Oral Daily  . atorvastatin  80 mg Oral Daily  . docusate sodium  200 mg Oral Daily  . gabapentin  100 mg Oral Daily  . gabapentin  300 mg Oral QHS  . levothyroxine  200 mcg Oral QAC breakfast  .  magnesium gluconate  1,000 mg Oral QHS  . multivitamin with minerals  1 tablet Oral QHS  . pantoprazole  40 mg Oral BID  . ranolazine  500 mg Oral BID  . senna  1 tablet Oral QHS  . sodium chloride  3 mL Intravenous Q12H  . Warfarin - Pharmacist Dosing Inpatient   Does not apply q1800    Infusions:    PRN Medications: acetaminophen, magnesium hydroxide, ondansetron, oxyCODONE-acetaminophen, promethazine   Assessment:   1. Atrial tach 2. Nausea 3. Lumbar Compression Fracture with ongoing back pain 4. 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 H/O GI bleed 02/2015 - S/P EGD with 2 AVMs clipped in jejunum. 6. CAD 7. Paroxysmal atrial fibrillation 8. H/O 09/2015 Nephrolithiasis with J stent 9. Chronic Anticoagulation - on coumadin - Goal for INR 2-2.5  10. Physical  deconditioning  Plan/Discussion:     Remains in NSR s/p DC-CV on 4/28. INR 2.6  Continue amio 400 bid for 2 weeks then back down to 200 daily. HF stable. Still with back pain. Ortho has seen. Increase neurontin to 300 bid with PRN percocet.   VAD parameters stable. Can go home today with close f/u in [place] Bensimhon,MD 11:44 AM

## 2015-03-07 ENCOUNTER — Other Ambulatory Visit (HOSPITAL_COMMUNITY): Payer: Self-pay | Admitting: Infectious Diseases

## 2015-03-07 DIAGNOSIS — I4891 Unspecified atrial fibrillation: Secondary | ICD-10-CM

## 2015-03-07 DIAGNOSIS — E039 Hypothyroidism, unspecified: Secondary | ICD-10-CM

## 2015-03-07 MED ORDER — AMIODARONE HCL 200 MG PO TABS: ORAL_TABLET | ORAL | Status: DC

## 2015-03-07 MED ORDER — AMLODIPINE BESYLATE 10 MG PO TABS: 10.0000 mg | ORAL_TABLET | Freq: Every day | ORAL | Status: DC

## 2015-03-07 MED ORDER — LEVOTHYROXINE SODIUM 200 MCG PO TABS: ORAL_TABLET | ORAL | Status: DC

## 2015-03-10 ENCOUNTER — Ambulatory Visit (INDEPENDENT_AMBULATORY_CARE_PROVIDER_SITE_OTHER): Payer: Medicare Other | Admitting: *Deleted

## 2015-03-10 ENCOUNTER — Encounter (HOSPITAL_COMMUNITY): Payer: Self-pay

## 2015-03-10 ENCOUNTER — Ambulatory Visit (HOSPITAL_COMMUNITY)
Admission: RE | Admit: 2015-03-10 | Discharge: 2015-03-10 | Disposition: A | Discharge status: Home / Self Care | Payer: Medicare Other | Source: Ambulatory Visit | Attending: Internal Medicine | Admitting: Internal Medicine

## 2015-03-10 ENCOUNTER — Ambulatory Visit (HOSPITAL_COMMUNITY): Payer: Self-pay | Admitting: Infectious Diseases

## 2015-03-10 ENCOUNTER — Other Ambulatory Visit (HOSPITAL_COMMUNITY): Payer: Medicare Other | Admitting: Infectious Diseases

## 2015-03-10 VITALS — BP 115/79 | HR 70 | Wt 198.6 lb

## 2015-03-10 DIAGNOSIS — R319 Hematuria, unspecified: Secondary | ICD-10-CM | POA: Insufficient documentation

## 2015-03-10 DIAGNOSIS — Z95811 Presence of heart assist device: Secondary | ICD-10-CM

## 2015-03-10 DIAGNOSIS — M4856XD Collapsed vertebra, not elsewhere classified, lumbar region, subsequent encounter for fracture with routine healing: Secondary | ICD-10-CM | POA: Diagnosis not present

## 2015-03-10 DIAGNOSIS — I5022 Chronic systolic (congestive) heart failure: Secondary | ICD-10-CM

## 2015-03-10 DIAGNOSIS — S32040A Wedge compression fracture of fourth lumbar vertebra, initial encounter for closed fracture: Secondary | ICD-10-CM

## 2015-03-10 DIAGNOSIS — I4891 Unspecified atrial fibrillation: Secondary | ICD-10-CM | POA: Insufficient documentation

## 2015-03-10 DIAGNOSIS — Z95810 Presence of automatic (implantable) cardiac defibrillator: Secondary | ICD-10-CM

## 2015-03-10 DIAGNOSIS — I255 Ischemic cardiomyopathy: Secondary | ICD-10-CM

## 2015-03-10 DIAGNOSIS — I4892 Unspecified atrial flutter: Secondary | ICD-10-CM | POA: Diagnosis not present

## 2015-03-10 DIAGNOSIS — N183 Chronic kidney disease, stage 3 (moderate): Secondary | ICD-10-CM | POA: Diagnosis not present

## 2015-03-10 DIAGNOSIS — E039 Hypothyroidism, unspecified: Secondary | ICD-10-CM | POA: Insufficient documentation

## 2015-03-10 DIAGNOSIS — Z9889 Other specified postprocedural states: Secondary | ICD-10-CM

## 2015-03-10 DIAGNOSIS — Z8673 Personal history of transient ischemic attack (TIA), and cerebral infarction without residual deficits: Secondary | ICD-10-CM | POA: Insufficient documentation

## 2015-03-10 DIAGNOSIS — I252 Old myocardial infarction: Secondary | ICD-10-CM | POA: Insufficient documentation

## 2015-03-10 DIAGNOSIS — E785 Hyperlipidemia, unspecified: Secondary | ICD-10-CM | POA: Diagnosis not present

## 2015-03-10 DIAGNOSIS — I739 Peripheral vascular disease, unspecified: Secondary | ICD-10-CM | POA: Insufficient documentation

## 2015-03-10 DIAGNOSIS — Z7901 Long term (current) use of anticoagulants: Secondary | ICD-10-CM | POA: Insufficient documentation

## 2015-03-10 DIAGNOSIS — Z79899 Other long term (current) drug therapy: Secondary | ICD-10-CM | POA: Diagnosis not present

## 2015-03-10 DIAGNOSIS — R001 Bradycardia, unspecified: Secondary | ICD-10-CM

## 2015-03-10 DIAGNOSIS — I129 Hypertensive chronic kidney disease with stage 1 through stage 4 chronic kidney disease, or unspecified chronic kidney disease: Secondary | ICD-10-CM | POA: Diagnosis not present

## 2015-03-10 LAB — CBC
HCT: 34.4 % — ABNORMAL LOW (ref 39.0–52.0)
Hemoglobin: 11.1 g/dL — ABNORMAL LOW (ref 13.0–17.0)
MCH: 29.9 pg (ref 26.0–34.0)
MCHC: 32.3 g/dL (ref 30.0–36.0)
MCV: 92.7 fL (ref 78.0–100.0)
Platelets: 214 10*3/uL (ref 150–400)
RBC: 3.71 MIL/uL — ABNORMAL LOW (ref 4.22–5.81)
RDW: 17.9 % — AB (ref 11.5–15.5)
WBC: 7.3 10*3/uL (ref 4.0–10.5)

## 2015-03-10 LAB — BASIC METABOLIC PANEL
Anion gap: 7 (ref 5–15)
BUN: 26 mg/dL — AB (ref 6–20)
CHLORIDE: 106 mmol/L (ref 101–111)
CO2: 26 mmol/L (ref 22–32)
CREATININE: 1.52 mg/dL — AB (ref 0.61–1.24)
Calcium: 9 mg/dL (ref 8.9–10.3)
GFR calc Af Amer: 50 mL/min — ABNORMAL LOW (ref >60)
GFR calc non Af Amer: 43 mL/min — ABNORMAL LOW (ref >60)
Glucose, Bld: 112 mg/dL — ABNORMAL HIGH (ref 70–99)
Potassium: 4.6 mmol/L (ref 3.5–5.1)
Sodium: 139 mmol/L (ref 135–145)

## 2015-03-10 LAB — PROTIME-INR
INR: 2.2 — AB (ref 0.00–1.49)
Prothrombin Time: 24.7 seconds — ABNORMAL HIGH (ref 11.6–15.2)

## 2015-03-10 LAB — LACTATE DEHYDROGENASE: LDH: 284 U/L — ABNORMAL HIGH (ref 98–192)

## 2015-03-10 MED ORDER — NITROGLYCERIN 0.4 MG SL SUBL: 0.4000 mg | SUBLINGUAL_TABLET | SUBLINGUAL | Status: DC | PRN

## 2015-03-10 NOTE — Patient Instructions (Signed)
1. Continue to do well! 2. No changes to your medications.  3. Next INR check on 03/24/15 4. Follow up with the VAD clinic again in 3 weeks.

## 2015-03-10 NOTE — Progress Notes (Signed)
Patient ID: [name], male   DOB: 12/01/1940, 74 y.o.   MRN: [id number]  PCP: Dr. Karolee Stamps ([place])  [name] is a 74 yo male with h/o VT, PAD and severe systolic HF (EF 58-85%) due to mixed ischemic/nonischemic CM he is s/p HM II LVAD implant for destination therapy on October 19, 2013. On 09/01/14 underwent cystoscopy and flexible ureteroscopy with lithotripsy and basket extraction.   VAD implantation was complicated by VT, syncope, A fib/Aflutter and persistent low output felt to be due to RHF. RHC showed low cardiac output but no evidence of RV failure on Milrinone. He was felt to be volume depleted. He was discharged home on 11/18/13 on mirlinone and PRN lasix 20 mg for a weight of 200 pounds or greater. Milrinone has since been weaned off.   RHC (07/13/14): Well compensated hemodynamics with VAD support.  Admitted 11/9-11/19/15 for hematuria and chronic nephrolithiasis. Underwent Echo to assess RV for concern RV failure because he presented with SOB. RV looked good and had renal US showed mild hyronephrosis and bilateral renal cysts. Dr. Risa Grill took back for repeat lithotripsy and stone extraction by ureteroscopy and J stent placement. Received total of 3 PRBCs. D/C weight 188 lbs.   Over past month has had 3 admits. In 4/16 initially admitted with weakness and fall. In ER hemoglobin was found to be 5.4 with MAP 78.  While hospitalized he received 5UPRBCs. 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 Then readmitted with acute L4 compression fracture. Most recently admitted with weakness and low PI. Found to have recurrent atrial tach and underwent TEE/DCC on 03/02/15  Follow up for Heart Failure/LVAD:  Returns for f/u. Feels better than he has in a while. Back pain improving with neurontin. Going to rehab. Weight at home stable. No orthopnea, PND or edema. No BRBPR or melena.Following a low salt diet and drinking less than 2L a day.    Denies LVAD alarms.  Denies driveline trauma, erythema or drainage.  Denies ICD shocks.    Reports taking Coumadin as prescribed and adherence to anticoagulation based dietary restrictions.  Denies bright red blood per rectum or melena, no dark urine or hematuria.     Past Medical History  Diagnosis Date  . Bradycardia   . Ventricular tachycardia     s/p AICD;   h/o ICD shock;  Amiodarone Rx. S/P ablation in 2012  . 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 HTN (hypertension)   . HLD (hyperlipidemia)   . Cytopenia   . Hypothyroidism   . Renal insufficiency   . Claudication   . Chronic systolic heart failure   . Ischemic cardiomyopathy     echo 7/12:  EF 25-30%, mild AI, mod MR, mod LAE  . TIA (transient ischemic attack) 2001  . CVD (cerebrovascular disease)     left CEA 2008  . Stroke   . Anemia   . Inappropriate therapy from implantable cardioverter-defibrillator 04/02/2013    ATP for sinus tach  SVT wavelet identified SVT but was no  passive   . Myocardial infarction 1992  . Anginal pain   . Automatic implantable cardioverter-defibrillator in situ     Medtronic Evera device serial number [id number]    . Arthritis     "  a little in my knees" (05/25/2013)  . Melanoma of ear 2013    "near left ear" (05/25/2013)  . 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 Dysrhythmia     HX A FIB  . History of nonmelanoma skin cancer     L EAR  . Kidney stone   . History of transfusion   . Sleep apnea     denies wearing mask (05/25/2013)  . CAD (coronary artery disease)     s/p MI 1982;  s/p DES to CFX 2011;  s/p NSTEMI 4/12 in setting of VTach;  cath 7/12:  LM ok, LAD mid 30-40%, Dx's with 40%, mCFX stent patent, prox to mid RCA occluded with R to R and L to R collats.  Medical Tx was continued  . CHF (congestive heart failure)     HAS VENTRICULAR PUMP     Current Outpatient Prescriptions  Medication Sig Dispense Refill  . acetaminophen (TYLENOL) 325 MG tablet Take 650 mg by mouth every 6 (six) hours as needed (pain).    [name] amiodarone (PACERONE) 200 MG tablet 400mg  BID for 2 weeks before drop down to 200mg  daily afterward 60 tablet 6  . amLODipine (NORVASC) 10 MG tablet Take 1 tablet (10 mg total) by mouth daily. 30 tablet 6  . atorvastatin (LIPITOR) 80 MG tablet Take 80 mg by mouth daily.     [name] docusate sodium (COLACE) 100 MG capsule Take 200 mg by mouth daily.     . furosemide (LASIX) 20 MG tablet Take 20 mg by mouth daily as needed for fluid.     [name] gabapentin (NEURONTIN) 300 MG capsule Take 1 capsule (300 mg total) by mouth 2 (two) times daily. 60 capsule 5  . levothyroxine (SYNTHROID, LEVOTHROID) 200 MCG tablet Take 200 mcg daily before breakfast 30 tablet 6  . magnesium gluconate (MAGONATE) 500 MG tablet Take 1,000 mg by mouth at bedtime.     . Multiple Vitamin (MULTIVITAMIN WITH MINERALS) TABS tablet Take 1 tablet by mouth at bedtime.    . Multiple Vitamins-Minerals (ICAPS PO) Take 1 tablet by mouth 2 (two) times daily.    . nitroGLYCERIN (NITROSTAT) 0.4 MG SL tablet Place 1 tablet (0.4 mg total) under the tongue every 5 (five) minutes as needed for chest pain. 30 tablet 6  . oxyCODONE-acetaminophen (PERCOCET/ROXICET) 5-325 MG per tablet Take 1-2 tablets by mouth every 4 (four) hours as needed for moderate pain. 30 tablet 0  . pantoprazole (PROTONIX) 40 MG tablet Take 1 tablet (40 mg total) by mouth 2 (two) times daily. 60 tablet 6  . promethazine (PHENERGAN) 12.5 MG tablet Take 1 tablet (12.5 mg total) by mouth every 6 (six) hours as needed for nausea or vomiting. 30 tablet 0  . RANEXA 500 MG 12 hr tablet TAKE 1 TABLET (500 MG TOTAL) BY MOUTH 2 (TWO) TIMES DAILY. 60 tablet 6  . warfarin (COUMADIN) 2 MG tablet Take 2mg  daily, except takes 4mg  on Monday and Friday. 50 tablet 6  . zinc gluconate 50 MG tablet Take 100 mg by mouth at bedtime.       No current facility-administered medications for this encounter.    Demerol  REVIEW OF SYSTEMS: All systems negative except as listed in HPI, PMH and Problem list.  LVAD INTERROGATION:   HeartMate II LVAD:   Speed: 9200 Flow: 3.7 Power: 4.7 PI: 6.0 Alarms: none Events: High PI events normal for him 10 - 15 daily with >40 on 03/09/15 Fixed speed: 9200  Low speed limit: 8600 Primary Controller: Replace back up battery (Feb 2017) Back up controller: Replace back up battery (Feb 2017)  I reviewed the LVAD parameters from today, and compared the results to the patient's prior recorded data.  Increased set speed to 9200 and back-up speed to 8600. The LVAD is functioning within specified parameters.  The patient performs LVAD self-test daily.  LVAD interrogation was negative for any significant power changes, alarms or PI events/speed drops.  LVAD equipment check completed and is in good working order.  Back-up equipment present.   LVAD education done on emergency procedures and precautions and reviewed exit site care.    Filed Vitals:   03/10/15 1424 03/10/15 1425  BP: 102/0 115/79  Pulse:  70  Weight:  198 lb 9.6 oz (90.084 kg)  SpO2:  100%   MAP 92 by automatic cuff.  Physical Exam: GENERAL: Well appearing, male who presents to clinic today in no acute distress. Wife present HEENT: normal  NECK: Supple, JVP 5-6.  2+ bilaterally, no bruits.  No lymphadenopathy or thyromegaly appreciated.   CARDIAC:  Mechanical heart sounds with LVAD hum present.  LUNGS:  Clear to auscultation bilaterally.  ABDOMEN:  Soft, round, nontender, positive bowel sounds x4.     LVAD exit site: well-healed and incorporated.  Dressing dry and intact.  No erythema or drainage.  Stabilization device present and accurately applied.  Driveline dressing is being changed weekly per sterile technique. EXTREMITIES:  Warm and dry, no cyanosis, clubbing, bilateral  o edema NEUROLOGIC:  Alert and oriented x  4.  Gait steady.  No aphasia.  No dysarthria.  Affect pleasant.      ASSESSMENT AND PLAN:   1. Chronic systolic HF: s/p LVAD 12/5850 - NYHA II symptoms and volume status looks good. Continue lasix PRN.  - He is not on a BB with history of RV failure.  - MAP ok today. Continue current dose of hydralazine and amlodipine at current dose.  - No ACE-I it increased his renal function in the past.  - I have asked him to wear compression stocking daily.  - Reinforced the need and importance of daily weights, a low sodium diet, and fluid restriction (less than 2 L a day). Instructed to call the HF clinic if weight increases more than 3 lbs overnight or 5 lbs in a week.  2. Afib/Flutter - s/p DC-CV 06/04/14. With repeat DC-CV 03/01/25. Maintaining NSR on ECG today. Continue amio 400 bid x 2 weeks then decrease to 200 bid. Continue adjunctive Ranexa. 2) HTN - Slightly elevated. Will continue current regimen. [name]  3) LVAD - LVAD parameters stable.  - Multiple PI events - but stable - Check CMET, INR, LDH, pro-BNP, prealbumin, LDH and Magnesium today.   4) CKD, stage III - Check CMET today.  5) Anticoagulation management: - Check INR today and HF pharmacy will address accordingly 6) Hematuria - Resolved and stent has been removed.  7) S/P Renal Stone lithotripsy- Per Dr Risa Grill. 8) GIB  - s/p clipping of 2 jejunal AVMs in 4/16. HGB improved 9) L4 compression fracture  - Pain control improved on neurontin. Continue rehab    Glori Bickers  MD  4:44 PM

## 2015-03-10 NOTE — Progress Notes (Signed)
Mr. Ohern presents today with his significant other  Symptom Yes No Details  Angina  x Activity:  Claudication  x How far:  Syncope  x When:  Stroke  x   Orthopnea  x How many pillows:  PND  x How often:  CPAP  x How many hrs:  Pedal edema  x   Abd fullness  x   N&V  x   Diaphoresis  x When:  Bleeding  x   Urine  x   SOB  x Activity:  Palpitations  x When:  ICD shock  x   Hospitlizaitons x  When/where/why: [facility] for Atrial Tach/DCCV 02/22/15  ED visit  x When/where/why:  Other MD  x When/who/why:  Activity  x Walking more and has started physical therapy for his back.   Fluid  x   Diet  x Good appetite    BP: 102 doppler  Automated Cuff: 115/79 (92) Weight: 198.6 lb HR:  70 SPO2: 100% Last weight:    VAD interrogation revealed: Speed: 9200 Flow:  3.7 Power:  4.7 PI: 6.0 Alarms:  none Events: High PI events normal for him 10 - 15 daily with >40 on 03/09/15 Fixed speed: 9200 Low speed limit: 8600 Primary Controller:  Replace back up battery in  __15___  Months. Back up controller:   Replace back up battery in  __15___  Months.  Reports that back pain is getting better with Gabapentin and PT. Walking more and moving around more with stretches he was given.    I reviewed the LVAD parameters from today, and compared the results to the patient's prior recorded data.  No programming changes were made.  The LVAD is functioning within specified parameters.  The patient performs LVAD self-test daily.  LVAD interrogation was negative for any significant power changes, alarms or PI events/speed drops.  LVAD equipment check completed and is in good working order.  Back-up equipment present.   LVAD education done on emergency procedures and precautions and reviewed exit site care.   VAD dressing removed and site care performed using sterile technique. Drive line exit site cleaned with Chlora prep applicators x 2, allowed to dry, and gauze dressing with aquacel strip re-applied. Exit  site healing, the velour is fully implanted at exit site. Remains erythematous under aquacel strip; no tenderness, drainage, or foul odor noted. Drive line anchor re-applied. Pt denies fever or chills. Driveline dressing is being changed daily per sterile technique. Will advance dressing changes to every other day. Pt/caregiver deny any alarms or VAD equipment issues.  Drive line exit site well healed and incorporated. The velour is fully implanted at exit site. Dressing dry and intact. No erythema or drainage. Stabilization device present and accurately applied. Driveline dressing is being changed weekly per sterile technique using Sorbaview dressing with biopatch on exit site. Pt denies fever or chills. Pt state they have adequate dressing supplies at home.   Pt/caregiver deny any alarms or VAD equipment issues. Pt is completing weekly and monthly maintenance for LVAD equipment. Reviewed calibrating batteries including recognizing prompt, steps to complete, and rationale for doing so. Pt verbalized understanding of same.   VAD coordinator reviewed daily log from home for daily temperature, weight, and VAD parameters. Pt is performing daily controller and system monitor self tests along with completing weekly and monthly maintenance for LVAD equipment.

## 2015-03-15 NOTE — Progress Notes (Signed)
Physical Therapy addendum to evaluation for G-code    2015/03/01 1303  PT G-Codes **NOT FOR INPATIENT CLASS**  Functional Assessment Tool Used clinical judgement  Functional Limitation Mobility: Walking and moving around  Mobility: Walking and Moving Around Current Status [phone]) CI  Mobility: Walking and Moving Around Goal Status [phone]) CI    03/15/2015 Barry Brunner, PT Pager: [phone]

## 2015-03-16 LAB — CUP PACEART REMOTE DEVICE CHECK
Brady Statistic AP VS Percent: 97.12 %
Brady Statistic AS VS Percent: 1.04 %
Brady Statistic RA Percent Paced: 98.84 %
Date Time Interrogation Session: 20160505120653
HighPow Impedance: 33 Ohm
HighPow Impedance: 39 Ohm
Lead Channel Impedance Value: 361 Ohm
Lead Channel Impedance Value: 551 Ohm
Lead Channel Pacing Threshold Amplitude: 0.875 V
Lead Channel Pacing Threshold Pulse Width: 0.4 ms
Lead Channel Sensing Intrinsic Amplitude: 1.875 mV
Lead Channel Sensing Intrinsic Amplitude: 2.125 mV
Lead Channel Sensing Intrinsic Amplitude: 2.125 mV
Lead Channel Setting Pacing Amplitude: 1.75 V
Lead Channel Setting Pacing Amplitude: 2 V
Lead Channel Setting Pacing Pulse Width: 0.4 ms
MDC IDC MSMT BATTERY REMAINING LONGEVITY: 96 mo
MDC IDC MSMT BATTERY VOLTAGE: 2.99 V
MDC IDC MSMT LEADCHNL RA PACING THRESHOLD AMPLITUDE: 0.875 V
MDC IDC MSMT LEADCHNL RV IMPEDANCE VALUE: 418 Ohm
MDC IDC MSMT LEADCHNL RV PACING THRESHOLD PULSEWIDTH: 0.4 ms
MDC IDC MSMT LEADCHNL RV SENSING INTR AMPL: 1.875 mV
MDC IDC SET LEADCHNL RV SENSING SENSITIVITY: 0.3 mV
MDC IDC SET ZONE DETECTION INTERVAL: 330 ms
MDC IDC SET ZONE DETECTION INTERVAL: 540 ms
MDC IDC STAT BRADY AP VP PERCENT: 1.71 %
MDC IDC STAT BRADY AS VP PERCENT: 0.12 %
MDC IDC STAT BRADY RV PERCENT PACED: 1.83 %
Zone Setting Detection Interval: 300 ms
Zone Setting Detection Interval: 370 ms
Zone Setting Detection Interval: 430 ms

## 2015-03-17 ENCOUNTER — Telehealth: Payer: Self-pay | Admitting: *Deleted

## 2015-03-17 ENCOUNTER — Other Ambulatory Visit: Payer: Self-pay | Admitting: *Deleted

## 2015-03-17 ENCOUNTER — Other Ambulatory Visit (HOSPITAL_COMMUNITY): Payer: Self-pay | Admitting: *Deleted

## 2015-03-17 ENCOUNTER — Ambulatory Visit (HOSPITAL_COMMUNITY)
Admission: RE | Admit: 2015-03-17 | Discharge: 2015-03-17 | Disposition: A | Discharge status: Home / Self Care | Payer: Medicare Other | Source: Ambulatory Visit | Attending: Internal Medicine | Admitting: Internal Medicine

## 2015-03-17 ENCOUNTER — Ambulatory Visit (HOSPITAL_COMMUNITY): Payer: Self-pay | Admitting: *Deleted

## 2015-03-17 VITALS — BP 99/78 | HR 70 | Ht 72.0 in | Wt 198.8 lb

## 2015-03-17 DIAGNOSIS — Z7901 Long term (current) use of anticoagulants: Secondary | ICD-10-CM | POA: Diagnosis not present

## 2015-03-17 DIAGNOSIS — R531 Weakness: Secondary | ICD-10-CM

## 2015-03-17 DIAGNOSIS — I480 Paroxysmal atrial fibrillation: Secondary | ICD-10-CM

## 2015-03-17 DIAGNOSIS — I4891 Unspecified atrial fibrillation: Secondary | ICD-10-CM

## 2015-03-17 DIAGNOSIS — Z48812 Encounter for surgical aftercare following surgery on the circulatory system: Secondary | ICD-10-CM | POA: Diagnosis present

## 2015-03-17 DIAGNOSIS — Z95811 Presence of heart assist device: Secondary | ICD-10-CM

## 2015-03-17 DIAGNOSIS — R0600 Dyspnea, unspecified: Secondary | ICD-10-CM | POA: Insufficient documentation

## 2015-03-17 DIAGNOSIS — I471 Supraventricular tachycardia: Secondary | ICD-10-CM

## 2015-03-17 LAB — BASIC METABOLIC PANEL
Anion gap: 8 (ref 5–15)
BUN: 21 mg/dL — AB (ref 6–20)
CHLORIDE: 100 mmol/L — AB (ref 101–111)
CO2: 29 mmol/L (ref 22–32)
CREATININE: 1.49 mg/dL — AB (ref 0.61–1.24)
Calcium: 8.9 mg/dL (ref 8.9–10.3)
GFR, EST AFRICAN AMERICAN: 51 mL/min — AB (ref >60)
GFR, EST NON AFRICAN AMERICAN: 44 mL/min — AB (ref >60)
Glucose, Bld: 103 mg/dL — ABNORMAL HIGH (ref 65–99)
POTASSIUM: 4.6 mmol/L (ref 3.5–5.1)
Sodium: 137 mmol/L (ref 135–145)

## 2015-03-17 LAB — CBC
HCT: 34.7 % — ABNORMAL LOW (ref 39.0–52.0)
HEMOGLOBIN: 11.1 g/dL — AB (ref 13.0–17.0)
MCH: 29.7 pg (ref 26.0–34.0)
MCHC: 32 g/dL (ref 30.0–36.0)
MCV: 92.8 fL (ref 78.0–100.0)
PLATELETS: 207 10*3/uL (ref 150–400)
RBC: 3.74 MIL/uL — ABNORMAL LOW (ref 4.22–5.81)
RDW: 17.7 % — ABNORMAL HIGH (ref 11.5–15.5)
WBC: 7.1 10*3/uL (ref 4.0–10.5)

## 2015-03-17 LAB — PROTIME-INR
INR: 2.03 — ABNORMAL HIGH (ref 0.00–1.49)
Prothrombin Time: 23.1 seconds — ABNORMAL HIGH (ref 11.6–15.2)

## 2015-03-17 LAB — BRAIN NATRIURETIC PEPTIDE: B NATRIURETIC PEPTIDE 5: 451 pg/mL — AB (ref 0.0–100.0)

## 2015-03-17 LAB — LACTATE DEHYDROGENASE: LDH: 266 U/L — ABNORMAL HIGH (ref 98–192)

## 2015-03-17 MED ORDER — AMIODARONE HCL 200 MG PO TABS: 400.0000 mg | ORAL_TABLET | Freq: Two times a day (BID) | ORAL | Status: DC

## 2015-03-17 NOTE — Patient Instructions (Signed)
1.  Continue Amiodarone 400 mg twice daily until next clinic visit.

## 2015-03-17 NOTE — Telephone Encounter (Signed)
Wife called to report pt is "weak and wobbly, pale".  [place] nurse had advised pt to go to [facility] to check K+ level.  Weakness has increased over last week per [name]; Dr. Haroldine Laws updated - instructed to come to clinic for nurse with labs and see MD if indicated. Wife agreed, will arrive in 45 mins. Labs ordered.

## 2015-03-17 NOTE — Progress Notes (Signed)
  Symptom  Yes  No  Details   Angina       x Activity:   Claudication        x How far:   Syncope        x When:     Stroke        x        TIA  2001  Orthopnea         x How many pillows: 1  PND         x How often:  CPAP       N/A How many hrs:   Pedal edema        x   one time since last visit; cleared after taking Lasix  Abd fullness               x    N&V        x         "gagging episodes" 1 time last night  Diaphoresis               x   Bleeding       x         occasional nosebleed when "blows his nose"  Urine color        yellow  SOB        x      Activity:  incline  Palpitations        x When:  ICD shock        x   Hospitlizaitons                 x When/where/why:   ED visit        x When/where/why:  Other MD             x   When/who/why:     Activity        Very limited with back pain; PT coming to home 2x weekly  Fluid    No limitations  < 2000   Diet    Low salt food   Vital signs: HR: 70 MAP BP:  92 Auto cuff:  99/78 (89) O2 Sat:  97 Wt:  198.8  lbs  Last wt: 198.6  lbs Ht: 6'1"  LVAD interrogation reveals:  Speed:   9200 RPM Flow:  3.9 Power:   4.9 PI:  6.6 Alarms:  several low voltage advisories Events:  30 - 70 events per day since 03/14/15 Fixed speed:  9200 Low speed limit:  8600 Primary Controller:  Replace back up battery in 15 months  (Feb 2017) Back up controller:   Replace back up battery in 15 months (Feb 2017)  Pt presented to clinic today with c/o weakness and fatigue that started Tuesday 03/15/15; pt is concerned it could be bleeding, but denies any bloody or dark stools.  Home wts 194 - 198; took PRN lasix 03/15/15 for wt gain and pedal edema with 4 lb wt loss over next 24 hrs.    Optivol interrogation completed and reviewed per Darrick Grinder, NP and Dr. Haroldine Laws; full device interrogation performed by [employer] rep and reviewed by both providers. Pt instructed to stay on Amiodarone 400 mg twice daily (was due to wean to 400 mg daily tomorrow) until  next clinic visit. Will repeat device interrogation.

## 2015-03-23 ENCOUNTER — Telehealth (HOSPITAL_COMMUNITY): Payer: Self-pay | Admitting: Vascular Surgery

## 2015-03-23 NOTE — Telephone Encounter (Signed)
[facility] therapist called she needs verbal orders to continue seeing the pt.. Please advise

## 2015-03-28 ENCOUNTER — Encounter: Payer: Self-pay | Admitting: Cardiology

## 2015-03-29 ENCOUNTER — Other Ambulatory Visit: Payer: Self-pay | Admitting: Vascular Surgery

## 2015-03-29 DIAGNOSIS — I739 Peripheral vascular disease, unspecified: Secondary | ICD-10-CM

## 2015-03-29 DIAGNOSIS — I779 Disorder of arteries and arterioles, unspecified: Secondary | ICD-10-CM

## 2015-03-29 DIAGNOSIS — Z48812 Encounter for surgical aftercare following surgery on the circulatory system: Secondary | ICD-10-CM

## 2015-03-31 ENCOUNTER — Ambulatory Visit (HOSPITAL_COMMUNITY)
Admission: RE | Admit: 2015-03-31 | Discharge: 2015-03-31 | Disposition: A | Discharge status: Home / Self Care | Payer: Medicare Other | Source: Ambulatory Visit | Attending: Cardiology | Admitting: Cardiology

## 2015-03-31 ENCOUNTER — Ambulatory Visit (HOSPITAL_COMMUNITY): Payer: Self-pay | Admitting: Infectious Diseases

## 2015-03-31 ENCOUNTER — Encounter (HOSPITAL_COMMUNITY): Payer: Self-pay

## 2015-03-31 VITALS — BP 97/70 | HR 70 | Ht 72.0 in | Wt 198.6 lb

## 2015-03-31 DIAGNOSIS — I480 Paroxysmal atrial fibrillation: Secondary | ICD-10-CM

## 2015-03-31 DIAGNOSIS — E785 Hyperlipidemia, unspecified: Secondary | ICD-10-CM | POA: Insufficient documentation

## 2015-03-31 DIAGNOSIS — Z5181 Encounter for therapeutic drug level monitoring: Secondary | ICD-10-CM

## 2015-03-31 DIAGNOSIS — E039 Hypothyroidism, unspecified: Secondary | ICD-10-CM | POA: Diagnosis not present

## 2015-03-31 DIAGNOSIS — I5022 Chronic systolic (congestive) heart failure: Secondary | ICD-10-CM | POA: Insufficient documentation

## 2015-03-31 DIAGNOSIS — M545 Low back pain, unspecified: Secondary | ICD-10-CM

## 2015-03-31 DIAGNOSIS — Z7901 Long term (current) use of anticoagulants: Secondary | ICD-10-CM

## 2015-03-31 DIAGNOSIS — I4892 Unspecified atrial flutter: Secondary | ICD-10-CM | POA: Insufficient documentation

## 2015-03-31 DIAGNOSIS — I4891 Unspecified atrial fibrillation: Secondary | ICD-10-CM | POA: Insufficient documentation

## 2015-03-31 DIAGNOSIS — Z95811 Presence of heart assist device: Secondary | ICD-10-CM | POA: Diagnosis not present

## 2015-03-31 DIAGNOSIS — Z8673 Personal history of transient ischemic attack (TIA), and cerebral infarction without residual deficits: Secondary | ICD-10-CM | POA: Diagnosis not present

## 2015-03-31 DIAGNOSIS — I129 Hypertensive chronic kidney disease with stage 1 through stage 4 chronic kidney disease, or unspecified chronic kidney disease: Secondary | ICD-10-CM | POA: Diagnosis not present

## 2015-03-31 DIAGNOSIS — N183 Chronic kidney disease, stage 3 (moderate): Secondary | ICD-10-CM | POA: Insufficient documentation

## 2015-03-31 DIAGNOSIS — I252 Old myocardial infarction: Secondary | ICD-10-CM | POA: Insufficient documentation

## 2015-03-31 DIAGNOSIS — M4856XA Collapsed vertebra, not elsewhere classified, lumbar region, initial encounter for fracture: Secondary | ICD-10-CM | POA: Insufficient documentation

## 2015-03-31 LAB — CBC
HCT: 35.8 % — ABNORMAL LOW (ref 39.0–52.0)
Hemoglobin: 11.2 g/dL — ABNORMAL LOW (ref 13.0–17.0)
MCH: 28.9 pg (ref 26.0–34.0)
MCHC: 31.3 g/dL (ref 30.0–36.0)
MCV: 92.3 fL (ref 78.0–100.0)
PLATELETS: 185 10*3/uL (ref 150–400)
RBC: 3.88 MIL/uL — ABNORMAL LOW (ref 4.22–5.81)
RDW: 17.4 % — ABNORMAL HIGH (ref 11.5–15.5)
WBC: 7.1 10*3/uL (ref 4.0–10.5)

## 2015-03-31 LAB — BASIC METABOLIC PANEL
Anion gap: 9 (ref 5–15)
BUN: 23 mg/dL — ABNORMAL HIGH (ref 6–20)
CALCIUM: 9.1 mg/dL (ref 8.9–10.3)
CO2: 26 mmol/L (ref 22–32)
CREATININE: 1.6 mg/dL — AB (ref 0.61–1.24)
Chloride: 104 mmol/L (ref 101–111)
GFR calc Af Amer: 47 mL/min — ABNORMAL LOW (ref >60)
GFR, EST NON AFRICAN AMERICAN: 41 mL/min — AB (ref >60)
GLUCOSE: 114 mg/dL — AB (ref 65–99)
Potassium: 4.3 mmol/L (ref 3.5–5.1)
Sodium: 139 mmol/L (ref 135–145)

## 2015-03-31 LAB — PROTIME-INR
INR: 2.43 — AB (ref 0.00–1.49)
PROTHROMBIN TIME: 26.1 s — AB (ref 11.6–15.2)

## 2015-03-31 LAB — BRAIN NATRIURETIC PEPTIDE: B NATRIURETIC PEPTIDE 5: 426.8 pg/mL — AB (ref 0.0–100.0)

## 2015-03-31 LAB — LACTATE DEHYDROGENASE: LDH: 236 U/L — ABNORMAL HIGH (ref 98–192)

## 2015-03-31 NOTE — Progress Notes (Signed)
Presents today with [name] (significant other).   Symptom  Yes  No  Details   Angina       x Activity:   Claudication        x How far:   Syncope        x When:     Stroke        x        TIA  2001  Orthopnea         x How many pillows: 1  PND         x How often:  CPAP       N/A How many hrs:   Pedal edema               x None since last visit, no lasix since last visit  Abd fullness               x    N&V        x         "gagging episodes" 1 time last night  Diaphoresis               x   Bleeding       x         occasional nosebleed when "blows his nose"  Urine color        yellow  SOB        x      Activity:  incline  Palpitations        x When:  ICD shock        x   Hospitlizaitons                 x When/where/why:   ED visit        x When/where/why:  Other MD             x   When/who/why:     Activity        Very limited with back pain; PT coming to home 2x weekly. Can do some exercises that PT gave him. Walks but only short distances. Cannot work or Merchandiser, retail.   Fluid    No limitations  < 2000   Diet    Low salt food   Vital signs: HR: 70 MAP BP: 82 Auto cuff: 97/70 (78)  O2 Sat:  Not picking up today  Wt:  198.6 lbs Last wt: 198.8  lbs Ht: 6'0"  LVAD interrogation reveals:  Speed:   9200 RPM Flow: 3.2 with some --- while we are sitting here Power: 4.6  PI: 3.5 - 4.0  Alarms: no alarms Events: This visit having 19 - 50 events daily (previous visit--30 - 70 events per day since 03/14/15) Fixed speed:  9200  Low speed limit:  8600 Primary Controller:  Replace back up battery in 15 months  (Feb 2017) Back up controller:   Replace back up battery in 15 months (Feb 2017)  Encounter Details:   In today for follow up with his significant other [name]. He arrived via wheelchair and does not appear to be feeling well today. He presents with severe back pain that is unrelieved since initial L4 compression Fx 02/20/15. He is rating this an 8/10 today. Unrelieved from tylenol,  Neurontin and does not like to take more than half a Percocet at a time d/t side effects. Sleeping in a recliner d/t the pain. Does participate with PT twice a week but is still  unable to do most exercises. Is reportedly walking ok but is still unable to work at this time which is frustrating to him. Needing assistance with repositioning and some ADLs. Appointment made with Dr. Ninfa Linden ([facility]) today at 2:30 pm for f/u since it has been 6 weeks and has had no improvement of symptoms.   Breathing, swelling and other HF symptoms he was having last week reportedly better. Having between 20 - 50 PI events today. Still on 400 mg BID of Amiodarone d/t previous interrogation and intermittant AFib. Dr. Haroldine Laws would like to have his device interrogated again today and would like for him to take a Lasix today for 1+ edema in LEs and elevated JVP. Interrogation of Medtr[employer] device showed that he has not had any episodes of sustained atrial fibrillation/tachycardia.   INR stable today and will recheck at next clinic visit in 3weeks since he has been therapeutic since D/C from hospital. Amiodarone to remain at 400 mg BID for now DW Dr. Haroldine Laws and will FU in clinic 3 weeks.

## 2015-03-31 NOTE — Patient Instructions (Addendum)
1. You have an appointment with Dr. Ninfa Linden with [facility] at 2:30 today   Address: [address], [place], [place] [address]   Phone: [phone] 2. INR 2.4 today--no changes with you coumadin. Next check will be at your next clinic appointment in 3 weeks.  3. Take 20 mg lasix today for swollen legs.

## 2015-04-01 ENCOUNTER — Encounter: Payer: Self-pay | Admitting: Vascular Surgery

## 2015-04-01 NOTE — Progress Notes (Signed)
Patient ID: [name], male   DOB: 09-01-1941, 74 y.o.   MRN: [id number]  PCP: Dr. Karolee Stamps ([place])  [name] is a 74 yo male with h/o VT, PAD and severe systolic HF (EF 40-98%) due to mixed ischemic/nonischemic CM he is s/p HM II LVAD implant for destination therapy on October 19, 2013. On 09/01/14 underwent cystoscopy and flexible ureteroscopy with lithotripsy and basket extraction.   VAD implantation was complicated by VT, syncope, A fib/Aflutter and persistent low output felt to be due to RHF. RHC showed low cardiac output but no evidence of RV failure on Milrinone. He was felt to be volume depleted. He was discharged home on 11/18/13 on mirlinone and PRN lasix 20 mg for a weight of 200 pounds or greater. Milrinone has since been weaned off.   RHC (07/13/14): Well compensated hemodynamics with VAD support.  Admitted 11/9-11/19/15 for hematuria and chronic nephrolithiasis. Underwent Echo to assess RV for concern RV failure because he presented with SOB. RV looked good and had renal US showed mild hyronephrosis and bilateral renal cysts. Dr. Risa Grill took back for repeat lithotripsy and stone extraction by ureteroscopy and J stent placement. Received total of 3 PRBCs. D/C weight 188 lbs.   Over past month has had 3 admits. In 4/16 initially admitted with weakness and fall. In ER hemoglobin was found to be 5.4 with MAP 78.  While hospitalized he received 5UPRBCs. 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 Then readmitted with acute L4 compression fracture. Most recently admitted with weakness and low PI. Found to have recurrent atrial tach and underwent TEE/DCC on 03/02/15  Follow up for Heart Failure/LVAD:  Returns for f/u. Back pain much worse. Having trouble walking. Neurontin not helping. Continues with occasional gagging episodes. Denies bleeding or HF symptoms. Mild edema managed with intermittent diuretics.   Denies LVAD alarms.   Denies driveline trauma, erythema or drainage.  Denies ICD shocks.    Reports taking Coumadin as prescribed and adherence to anticoagulation based dietary restrictions.  Denies bright red blood per rectum or melena, no dark urine or hematuria.     Past Medical History  Diagnosis Date  . Bradycardia   . Ventricular tachycardia     s/p AICD;   h/o ICD shock;  Amiodarone Rx. S/P ablation in 2012  . 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 HTN (hypertension)   . HLD (hyperlipidemia)   . Cytopenia   . Hypothyroidism   . Renal insufficiency   . Claudication   . Chronic systolic heart failure   . Ischemic cardiomyopathy     echo 7/12:  EF 25-30%, mild AI, mod MR, mod LAE  . TIA (transient ischemic attack) 2001  . CVD (cerebrovascular disease)     left CEA 2008  . Stroke   . Anemia   . Inappropriate therapy from implantable cardioverter-defibrillator 04/02/2013    ATP for sinus tach  SVT wavelet identified SVT but was no  passive   . Myocardial infarction 1992  . Anginal pain   . Automatic implantable cardioverter-defibrillator in situ     Medtronic Evera device serial number [id number]    . Arthritis     "a little in my knees" (05/25/2013)  . Melanoma of ear 2013    "  near left ear" (05/25/2013)  . 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 Dysrhythmia     HX A FIB  . History of nonmelanoma skin cancer     L EAR  . Kidney stone   . History of transfusion   . Sleep apnea     denies wearing mask (05/25/2013)  . CAD (coronary artery disease)     s/p MI 1982;  s/p DES to CFX 2011;  s/p NSTEMI 4/12 in setting of VTach;  cath 7/12:  LM ok, LAD mid 30-40%, Dx's with 40%, mCFX stent patent, prox to mid RCA occluded with R to R and L to R collats.  Medical Tx was continued  . CHF (congestive heart failure)     HAS VENTRICULAR PUMP    Current Outpatient  Prescriptions  Medication Sig Dispense Refill  . acetaminophen (TYLENOL) 325 MG tablet Take 650 mg by mouth every 6 (six) hours as needed (pain).    [name] amiodarone (PACERONE) 200 MG tablet Take 2 tablets (400 mg total) by mouth 2 (two) times daily. 120 tablet 6  . amLODipine (NORVASC) 10 MG tablet Take 1 tablet (10 mg total) by mouth daily. 30 tablet 6  . atorvastatin (LIPITOR) 80 MG tablet Take 80 mg by mouth daily.     [name] docusate sodium (COLACE) 100 MG capsule Take 200 mg by mouth daily.     . furosemide (LASIX) 20 MG tablet Take 20 mg by mouth daily as needed for fluid.     [name] gabapentin (NEURONTIN) 300 MG capsule Take 1 capsule (300 mg total) by mouth 2 (two) times daily. 60 capsule 5  . levothyroxine (SYNTHROID, LEVOTHROID) 200 MCG tablet Take 200 mcg daily before breakfast 30 tablet 6  . magnesium gluconate (MAGONATE) 500 MG tablet Take 1,000 mg by mouth at bedtime.     . Multiple Vitamin (MULTIVITAMIN WITH MINERALS) TABS tablet Take 1 tablet by mouth at bedtime.    . Multiple Vitamins-Minerals (ICAPS PO) Take 1 tablet by mouth 2 (two) times daily.    . nitroGLYCERIN (NITROSTAT) 0.4 MG SL tablet Place 1 tablet (0.4 mg total) under the tongue every 5 (five) minutes as needed for chest pain. 30 tablet 6  . pantoprazole (PROTONIX) 40 MG tablet Take 1 tablet (40 mg total) by mouth 2 (two) times daily. 60 tablet 6  . promethazine (PHENERGAN) 12.5 MG tablet Take 1 tablet (12.5 mg total) by mouth every 6 (six) hours as needed for nausea or vomiting. 30 tablet 0  . RANEXA 500 MG 12 hr tablet TAKE 1 TABLET (500 MG TOTAL) BY MOUTH 2 (TWO) TIMES DAILY. 60 tablet 6  . warfarin (COUMADIN) 2 MG tablet Take 2mg  daily, except takes 4mg  on Monday and Friday. 50 tablet 6  . zinc gluconate 50 MG tablet Take 100 mg by mouth at bedtime.     [name] oxyCODONE-acetaminophen (PERCOCET/ROXICET) 5-325 MG per tablet Take 1-2 tablets by mouth every 4 (four) hours as needed for moderate pain. (Patient not taking: Reported on  03/31/2015) 30 tablet 0   No current facility-administered medications for this encounter.    Demerol  REVIEW OF SYSTEMS: All systems negative except as listed in HPI, PMH and Problem list.  LVAD INTERROGATION:   HeartMate II LVAD:   Speed: 9200 RPM  Flow: 3.2 with some --- while we are sitting here  Power: 4.6  PI: 3.5 - 4.0  Alarms: no alarms  Events: This visit having 19 - 50 events daily (previous  visit--30 - 70 events per day since 03/14/15)  Fixed speed: 9200  Low speed limit: 8600  Primary Controller: Replace back up battery in 15 months (Feb 2017)  Back up controller: Replace back up battery in 15 months (Feb 2017)   I reviewed the LVAD parameters from today, and compared the results to the patient's prior recorded data.  Increased set speed to 9200 and back-up speed to 8600. The LVAD is functioning within specified parameters.  The patient performs LVAD self-test daily.  LVAD interrogation was negative for any significant power changes, alarms or PI events/speed drops.  LVAD equipment check completed and is in good working order.  Back-up equipment present.   LVAD education done on emergency procedures and precautions and reviewed exit site care.    Filed Vitals:   03/31/15 1011 03/31/15 1017  BP: 82/0 97/70  Pulse: 70   Height: 6' (1.829 m)   Weight: 198 lb 9.6 oz (90.084 kg)     Physical Exam: GENERAL: Uncomfortable due to back pain. Wife present HEENT: normal  NECK: Supple, JVP 8-9.  2+ bilaterally, no bruits.  No lymphadenopathy or thyromegaly appreciated.   CARDIAC:  Mechanical heart sounds with LVAD hum present.  LUNGS:  Clear to auscultation bilaterally.  ABDOMEN:  Soft, round, nontender, positive bowel sounds x4.     LVAD exit site: well-healed and incorporated.  Dressing dry and intact.  No erythema or drainage.  Stabilization device present and accurately applied.  Driveline dressing is being changed weekly per sterile technique. EXTREMITIES:  Warm and dry,  no cyanosis, clubbing, bilateral  1+ edema NEUROLOGIC:  Alert and oriented x 4.  Gait steady.  No aphasia.  No dysarthria.  Affect pleasant.      ASSESSMENT AND PLAN:   1. Chronic systolic HF: s/p LVAD 01/5464 - NYHA II symptoms. Volume status looks mildly up. Continue lasix PRN. Will have him take dose today.  - He is not on a BB with history of RV failure.  - MAP ok today. Continue current dose of hydralazine and amlodipine at current dose.  - No ACE-I d/t increased his renal function in the past.  - Reinforced the need and importance of daily weights, a low sodium diet, and fluid restriction (less than 2 L a day). Instructed to call the HF clinic if weight increases more than 3 lbs overnight or 5 lbs in a week.  2. Afib/Flutter - s/p DC-CV 06/04/14. With repeat DC-CV 03/01/25. Maintaining NSR on ECG today. Continue amio 200 bid. Continue adjunctive Ranexa. 2) HTN - Blood pressure well controlled. Continue current regimen. 3) LVAD - LVAD parameters reviewed personally - stable.  - Multiple PI events - but stable - Check CMET, INR, LDH, pro-BNP, prealbumin, LDH and Magnesium today.   4) CKD, stage III - Check CMET today.  5) Anticoagulation management: - Check INR today and HF pharmacy will address accordingly 6) Hematuria - Resolved and stent has been removed.  7) S/P Renal Stone lithotripsy- Per Dr Risa Grill. 8) GIB  - s/p clipping of 2 jejunal AVMs in 4/16. HGB improved 9) L4 compression fracture  - Pain much worse today. We have made him appt with Dr Ninfa Linden to be seen today.   Glori Bickers  MD  9:14 PM

## 2015-04-05 ENCOUNTER — Ambulatory Visit (HOSPITAL_COMMUNITY)
Admission: RE | Admit: 2015-04-05 | Discharge: 2015-04-05 | Disposition: A | Discharge status: Home / Self Care | Payer: Medicare Other | Source: Ambulatory Visit | Attending: Vascular Surgery | Admitting: Vascular Surgery

## 2015-04-05 ENCOUNTER — Ambulatory Visit (INDEPENDENT_AMBULATORY_CARE_PROVIDER_SITE_OTHER)
Admission: RE | Admit: 2015-04-05 | Discharge: 2015-04-05 | Disposition: A | Discharge status: Home / Self Care | Payer: Medicare Other | Source: Ambulatory Visit | Attending: Vascular Surgery | Admitting: Vascular Surgery

## 2015-04-05 ENCOUNTER — Encounter (HOSPITAL_COMMUNITY): Payer: Self-pay | Admitting: *Deleted

## 2015-04-05 ENCOUNTER — Encounter: Payer: Self-pay | Admitting: Vascular Surgery

## 2015-04-05 ENCOUNTER — Ambulatory Visit (INDEPENDENT_AMBULATORY_CARE_PROVIDER_SITE_OTHER): Payer: Medicare Other | Admitting: Vascular Surgery

## 2015-04-05 VITALS — BP 98/78 | HR 68 | Temp 96.6°F | Resp 14 | Ht 72.0 in | Wt 190.0 lb

## 2015-04-05 DIAGNOSIS — I739 Peripheral vascular disease, unspecified: Secondary | ICD-10-CM

## 2015-04-05 DIAGNOSIS — I6523 Occlusion and stenosis of bilateral carotid arteries: Secondary | ICD-10-CM

## 2015-04-05 DIAGNOSIS — I779 Disorder of arteries and arterioles, unspecified: Secondary | ICD-10-CM

## 2015-04-05 DIAGNOSIS — Z48812 Encounter for surgical aftercare following surgery on the circulatory system: Secondary | ICD-10-CM

## 2015-04-05 NOTE — Progress Notes (Signed)
Subjective:     Patient ID: [name], male   DOB: 06-Aug-1941, 74 y.o.   MRN: [id number]  HPI this 74 year old male returns for annual follow-up regarding his multiple bypass procedures. He has undergone aortobifemoral bypass grafting, bilateral femoral popliteal saphenous vein grafting, left carotid endarterectomy. He remains asymptomatic from these procedures. He denies lateralizing weakness, aphasia, amaurosis fugax, diplopia, blurred vision, or syncope. He continues to work as a Presenter, broadcasting. He had an LVAD inserted in December 2014 and has done very well from that standpoint. Recently he fell returning to bed during the night and fractured his lumbar spine which is his biggest problem at the present time. He cannot have surgery because of needing to stay on Coumadin for the LVAD. Dr. Ninfa Linden is managing this . He has no history of rest pain and nonhealing ulcers infection or gangrene in the lower extremities.  Past Medical History  Diagnosis Date  . Bradycardia   . Ventricular tachycardia     s/p AICD;   h/o ICD shock;  Amiodarone Rx. S/P ablation in 2012  . 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 HTN (hypertension)   . HLD (hyperlipidemia)   . Cytopenia   . Hypothyroidism   . Renal insufficiency   . Claudication   . Chronic systolic heart failure   . Ischemic cardiomyopathy     echo 7/12:  EF 25-30%, mild AI, mod MR, mod LAE  . TIA (transient ischemic attack) 2001  . CVD (cerebrovascular disease)     left CEA 2008  . Stroke   . Anemia   . Inappropriate therapy from implantable cardioverter-defibrillator 04/02/2013    ATP for sinus tach  SVT wavelet identified SVT but was no  passive   . Myocardial infarction 1992  . Anginal pain   . Automatic implantable cardioverter-defibrillator in situ     Medtronic Evera device serial number [id number]     . Arthritis     "a little in my knees" (05/25/2013)  . Melanoma of ear 2013    "near left ear" (05/25/2013)  . 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 Dysrhythmia     HX A FIB  . History of nonmelanoma skin cancer     L EAR  . Kidney stone   . History of transfusion   . Sleep apnea     denies wearing mask (05/25/2013)  . CAD (coronary artery disease)     s/p MI 1982;  s/p DES to CFX 2011;  s/p NSTEMI 4/12 in setting of VTach;  cath 7/12:  LM ok, LAD mid 30-40%, Dx's with 40%, mCFX stent patent, prox to mid RCA occluded with R to R and L to R collats.  Medical Tx was continued  . CHF (congestive heart failure)     HAS VENTRICULAR PUMP    History  Substance Use Topics  . Smoking status: Former Smoker -- 0.50 packs/day for 37 years    Types: Cigarettes    Quit date: 11/05/2001  . Smokeless tobacco: Never Used  . Alcohol Use: 0.0 oz/week     Comment: 05/25/2013 "mixed drink couple times/month"    Family History  Problem Relation Age of Onset  . Coronary artery disease    . Heart attack Mother   . Coronary artery disease  Mother   . Cancer Mother   . Heart disease Mother   . Hyperlipidemia Mother   . Hypertension Mother   . Coronary artery disease Father   . Stroke Father   . Cancer Father   . Heart disease Father     before age 22  . Hyperlipidemia Father   . Hypertension Father   . Heart attack Father     Allergies  Allergen Reactions  . Demerol [Meperidine] Other (See Comments)    Paralysis. Could only move eyes.     Current outpatient prescriptions:  .  acetaminophen (TYLENOL) 325 MG tablet, Take 650 mg by mouth every 6 (six) hours as needed (pain)., Disp: , Rfl:  .  amiodarone (PACERONE) 200 MG tablet, Take 2 tablets (400 mg total) by mouth 2 (two) times daily., Disp: 120 tablet, Rfl: 6 .  amLODipine (NORVASC) 10 MG tablet, Take 1 tablet (10 mg total) by mouth daily., Disp: 30 tablet, Rfl: 6 .  atorvastatin (LIPITOR) 80 MG tablet, Take 80 mg by mouth daily. ,  Disp: , Rfl:  .  docusate sodium (COLACE) 100 MG capsule, Take 200 mg by mouth daily. , Disp: , Rfl:  .  furosemide (LASIX) 20 MG tablet, Take 20 mg by mouth daily as needed for fluid. , Disp: , Rfl:  .  gabapentin (NEURONTIN) 300 MG capsule, Take 1 capsule (300 mg total) by mouth 2 (two) times daily., Disp: 60 capsule, Rfl: 5 .  levothyroxine (SYNTHROID, LEVOTHROID) 200 MCG tablet, Take 200 mcg daily before breakfast, Disp: 30 tablet, Rfl: 6 .  magnesium gluconate (MAGONATE) 500 MG tablet, Take 1,000 mg by mouth at bedtime. , Disp: , Rfl:  .  Multiple Vitamin (MULTIVITAMIN WITH MINERALS) TABS tablet, Take 1 tablet by mouth at bedtime., Disp: , Rfl:  .  Multiple Vitamins-Minerals (ICAPS PO), Take 1 tablet by mouth 2 (two) times daily., Disp: , Rfl:  .  nitroGLYCERIN (NITROSTAT) 0.4 MG SL tablet, Place 1 tablet (0.4 mg total) under the tongue every 5 (five) minutes as needed for chest pain., Disp: 30 tablet, Rfl: 6 .  oxyCODONE-acetaminophen (PERCOCET/ROXICET) 5-325 MG per tablet, Take 1-2 tablets by mouth every 4 (four) hours as needed for moderate pain. (Patient not taking: Reported on 03/31/2015), Disp: 30 tablet, Rfl: 0 .  pantoprazole (PROTONIX) 40 MG tablet, Take 1 tablet (40 mg total) by mouth 2 (two) times daily., Disp: 60 tablet, Rfl: 6 .  promethazine (PHENERGAN) 12.5 MG tablet, Take 1 tablet (12.5 mg total) by mouth every 6 (six) hours as needed for nausea or vomiting., Disp: 30 tablet, Rfl: 0 .  RANEXA 500 MG 12 hr tablet, TAKE 1 TABLET (500 MG TOTAL) BY MOUTH 2 (TWO) TIMES DAILY., Disp: 60 tablet, Rfl: 6 .  warfarin (COUMADIN) 2 MG tablet, Take 2mg  daily, except takes 4mg  on Monday and Friday., Disp: 50 tablet, Rfl: 6 .  zinc gluconate 50 MG tablet, Take 100 mg by mouth at bedtime. , Disp: , Rfl:   Filed Vitals:   04/05/15 1518  BP: 98/78  Pulse: 68  Temp: 96.6 F (35.9 C)  TempSrc: Oral  Resp: 14  Height: 6' (1.829 m)  Weight: 190 lb (86.183 kg)  SpO2: 98%    Body mass index  is 25.76 kg/(m^2).           Review of Systems as noted has ischemic cardiomyopathy and does have LVAD in place   denies claudication symptoms. Recent lumbar spine fracture after fall. Denies other symptoms and  a complete review of systems    Physical Exam BP 98/78 mmHg  Pulse 68  Temp(Src) 96.6 F (35.9 C) (Oral)  Resp 14  Ht 6' (1.829 m)  Wt 190 lb (86.183 kg)  BMI 25.76 kg/m2  SpO2 98%  Gen.-alert and oriented x3 in no apparent distress HEENT normal for age Lungs no rhonchi or wheezing Cardiovascular regular rhythm no murmurs-=-LVAD generator audible-- carotid pulses 3+ palpable no bruits audible Abdomen soft nontender no palpable masses Musculoskeletal free of  major deformities Skin clear -no rashes Neurologic normal Lower extremities 3+ femoral and dorsalis pedis pulses palpable bilaterally with no edema  Today I ordered a carotid duplex exam and bilateral lower extremity scans of the femoral-popliteal grafts all of which I reviewed and interpreted. There is no evidence of restenosis of the left carotid endarterectomy site in the right internal carotid appears widely patent Both femoral-popliteal saphenous vein grafts are widely patent. There is no evidence of stenosis or increased velocity. ABIs are exceed 1.0 bilaterally with triphasic flow.       Assessment:     Doing well following left carotid endarterectomy, aortobifemoral bypass grafting, bilateral femoral popliteal bypass grafting-asymptomatic from these areas Recent fall with lumbar vertebral fracture Ischemic cardiomyopathy with LVAD-on chronic Coumadin therapy    Plan:     Patient will return in 1 year for repeat duplex scan bilateral femoral-popliteal bypass grafts, bilateral ABIs, and carotid duplex exam. Continue Coumadin therapy

## 2015-04-05 NOTE — Progress Notes (Addendum)
Wife called VAD pager this am to report VAD controller issues.  Stated "VAD numbers are going crazy"; said PI was dropping to 2.8, and flow was dropping to 3.2 and ---; no change in power reported. Pt is going to be in [place] for another appt this afternoon. Asked her to come by clinic and we can check controller.  Pt reported in w/c due to back pain.  Feels fine otherwise.    VAD parameters: Flow:  4.2 Speed:  9200 PI:  6.9 Power:  5.0 Alarms:  None Events:  20 - 30 PI events over last few days  Doppler MAP:  98 Auto cuff:  112/73 (92)  Period of PI and flow drops reported this am correlate with PI events.  Optivol reviewed by Darrick Grinder, NP - pt is not in Afib.  MAP elevated - will re-start hydralazine 12.5 tid per Darrick Grinder, NP.  Instructed pt to call if he has any symptoms of dizziness, lightheadedness, syncope or if he has any VAD alarms. Pt and [name] verbalized understanding of same.

## 2015-04-05 NOTE — Addendum Note (Signed)
Addended by: Mena Goes on: 04/05/2015 05:06 PM   Modules accepted: Orders

## 2015-04-06 ENCOUNTER — Encounter: Payer: Self-pay | Admitting: Internal Medicine

## 2015-04-21 ENCOUNTER — Other Ambulatory Visit (HOSPITAL_COMMUNITY): Payer: Self-pay | Admitting: *Deleted

## 2015-04-21 ENCOUNTER — Ambulatory Visit (HOSPITAL_COMMUNITY): Payer: Self-pay | Admitting: *Deleted

## 2015-04-21 ENCOUNTER — Ambulatory Visit (HOSPITAL_COMMUNITY)
Admission: RE | Admit: 2015-04-21 | Discharge: 2015-04-21 | Disposition: A | Discharge status: Home / Self Care | Payer: Medicare Other | Source: Ambulatory Visit | Attending: Internal Medicine | Admitting: Internal Medicine

## 2015-04-21 VITALS — BP 98/49 | HR 70 | Ht 72.0 in | Wt 201.6 lb

## 2015-04-21 DIAGNOSIS — R0600 Dyspnea, unspecified: Secondary | ICD-10-CM

## 2015-04-21 DIAGNOSIS — Z7901 Long term (current) use of anticoagulants: Secondary | ICD-10-CM

## 2015-04-21 DIAGNOSIS — I480 Paroxysmal atrial fibrillation: Secondary | ICD-10-CM | POA: Diagnosis not present

## 2015-04-21 DIAGNOSIS — I5022 Chronic systolic (congestive) heart failure: Secondary | ICD-10-CM | POA: Diagnosis not present

## 2015-04-21 DIAGNOSIS — N183 Chronic kidney disease, stage 3 (moderate): Secondary | ICD-10-CM | POA: Diagnosis not present

## 2015-04-21 DIAGNOSIS — Z95811 Presence of heart assist device: Secondary | ICD-10-CM

## 2015-04-21 DIAGNOSIS — M545 Low back pain, unspecified: Secondary | ICD-10-CM

## 2015-04-21 DIAGNOSIS — R319 Hematuria, unspecified: Secondary | ICD-10-CM | POA: Insufficient documentation

## 2015-04-21 DIAGNOSIS — Z87891 Personal history of nicotine dependence: Secondary | ICD-10-CM | POA: Insufficient documentation

## 2015-04-21 DIAGNOSIS — I159 Secondary hypertension, unspecified: Secondary | ICD-10-CM | POA: Diagnosis not present

## 2015-04-21 DIAGNOSIS — D509 Iron deficiency anemia, unspecified: Secondary | ICD-10-CM | POA: Insufficient documentation

## 2015-04-21 LAB — CBC WITH DIFFERENTIAL/PLATELET
BASOS PCT: 1 % (ref 0–1)
Basophils Absolute: 0 10*3/uL (ref 0.0–0.1)
EOS ABS: 0.2 10*3/uL (ref 0.0–0.7)
Eosinophils Relative: 3 % (ref 0–5)
HEMATOCRIT: 34.6 % — AB (ref 39.0–52.0)
Hemoglobin: 11.1 g/dL — ABNORMAL LOW (ref 13.0–17.0)
LYMPHS ABS: 0.8 10*3/uL (ref 0.7–4.0)
Lymphocytes Relative: 14 % (ref 12–46)
MCH: 29.1 pg (ref 26.0–34.0)
MCHC: 32.1 g/dL (ref 30.0–36.0)
MCV: 90.6 fL (ref 78.0–100.0)
MONO ABS: 0.9 10*3/uL (ref 0.1–1.0)
MONOS PCT: 15 % — AB (ref 3–12)
Neutro Abs: 4.1 10*3/uL (ref 1.7–7.7)
Neutrophils Relative %: 67 % (ref 43–77)
Platelets: 201 10*3/uL (ref 150–400)
RBC: 3.82 MIL/uL — AB (ref 4.22–5.81)
RDW: 16.9 % — AB (ref 11.5–15.5)
WBC: 6 10*3/uL (ref 4.0–10.5)

## 2015-04-21 LAB — LACTATE DEHYDROGENASE: LDH: 235 U/L — ABNORMAL HIGH (ref 98–192)

## 2015-04-21 LAB — PREALBUMIN: Prealbumin: 22.9 mg/dL (ref 18–38)

## 2015-04-21 LAB — PROTIME-INR
INR: 2.58 — AB (ref 0.00–1.49)
PROTHROMBIN TIME: 27.4 s — AB (ref 11.6–15.2)

## 2015-04-21 LAB — BRAIN NATRIURETIC PEPTIDE: B Natriuretic Peptide: 606 pg/mL — ABNORMAL HIGH (ref 0.0–100.0)

## 2015-04-21 MED ORDER — FERROUS GLUCONATE 324 (38 FE) MG PO TABS: 324.0000 mg | ORAL_TABLET | Freq: Every day | ORAL | Status: DC

## 2015-04-21 NOTE — Progress Notes (Signed)
Patient ID: [name], male   DOB: 1941-03-07, 74 y.o.   MRN: [id number] PCP: Dr. Karolee Stamps ([place])  [name] is a 74 yo male with h/o VT, PAD and severe systolic HF (EF 28-31%) due to mixed ischemic/nonischemic CM he is s/p HM II LVAD implant for destination therapy on October 19, 2013. On 09/01/14 underwent cystoscopy and flexible ureteroscopy with lithotripsy and basket extraction.   VAD implantation was complicated by VT, syncope, A fib/Aflutter and persistent low output felt to be due to RHF. RHC showed low cardiac output but no evidence of RV failure on Milrinone. He was felt to be volume depleted. He was discharged home on 11/18/13 on mirlinone and PRN lasix 20 mg for a weight of 200 pounds or greater. Milrinone has since been weaned off.   RHC (07/13/14): Well compensated hemodynamics with VAD support.  Admitted 11/9-11/19/15 for hematuria and chronic nephrolithiasis. Underwent Echo to assess RV for concern RV failure because he presented with SOB. RV looked good and had renal US showed mild hyronephrosis and bilateral renal cysts. Dr. Risa Grill took back for repeat lithotripsy and stone extraction by ureteroscopy and J stent placement. Received total of 3 PRBCs. D/C weight 188 lbs.   Over past month has had 3 admits. In 4/16 initially admitted with weakness and fall. In ER hemoglobin was found to be 5.4 with MAP 78.  While hospitalized he received 5UPRBCs. 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 Then readmitted with acute L4 compression fracture. Most recently admitted with weakness and low PI. Found to have recurrent atrial tach and underwent TEE/DCC on 03/02/15  Follow up for Heart Failure/LVAD:  Last visit hydralazine was restarted. Returns for f/u. Back pain improved. Denies SOB/PND/Orthopnea. Able to walk around the house. Leg fatigue. 3 falls with leg weakness.   Denies LVAD alarms.  Denies driveline trauma, erythema or  drainage.  Denies ICD shocks.    Reports taking Coumadin as prescribed and adherence to anticoagulation based dietary restrictions.  Denies bright red blood per rectum or melena, no dark urine or hematuria.     Past Medical History  Diagnosis Date  . Bradycardia   . Ventricular tachycardia     s/p AICD;   h/o ICD shock;  Amiodarone Rx. S/P ablation in 2012  . 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 HTN (hypertension)   . HLD (hyperlipidemia)   . Cytopenia   . Hypothyroidism   . Renal insufficiency   . Claudication   . Chronic systolic heart failure   . Ischemic cardiomyopathy     echo 7/12:  EF 25-30%, mild AI, mod MR, mod LAE  . TIA (transient ischemic attack) 2001  . CVD (cerebrovascular disease)     left CEA 2008  . Stroke   . Anemia   . Inappropriate therapy from implantable cardioverter-defibrillator 04/02/2013    ATP for sinus tach  SVT wavelet identified SVT but was no  passive   . Myocardial infarction 1992  . Anginal pain   . Automatic implantable cardioverter-defibrillator in situ     Med[employer]onic Evera device serial number [id number]    . Arthritis     "a little in my knees" (05/25/2013)  . Melanoma of ear 2013    "near  left ear" (05/25/2013)  . 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 Dysrhythmia     HX A FIB  . History of nonmelanoma skin cancer     L EAR  . Kidney stone   . History of transfusion   . Sleep apnea     denies wearing mask (05/25/2013)  . CAD (coronary artery disease)     s/p MI 1982;  s/p DES to CFX 2011;  s/p NSTEMI 4/12 in setting of VTach;  cath 7/12:  LM ok, LAD mid 30-40%, Dx's with 40%, mCFX stent patent, prox to mid RCA occluded with R to R and L to R collats.  Medical Tx was continued  . CHF (congestive heart failure)     HAS VENTRICULAR PUMP    Current Outpatient Prescriptions  Medication Sig Dispense  Refill  . acetaminophen (TYLENOL) 325 MG tablet Take 650 mg by mouth every 6 (six) hours as needed (pain).    [name] amiodarone (PACERONE) 200 MG tablet Take 2 tablets (400 mg total) by mouth 2 (two) times daily. 120 tablet 6  . amLODipine (NORVASC) 10 MG tablet Take 1 tablet (10 mg total) by mouth daily. 30 tablet 6  . atorvastatin (LIPITOR) 80 MG tablet Take 80 mg by mouth daily.     [name] docusate sodium (COLACE) 100 MG capsule Take 200 mg by mouth daily. Taking every other day    . furosemide (LASIX) 20 MG tablet Take 20 mg by mouth daily as needed for fluid.     [name] gabapentin (NEURONTIN) 300 MG capsule Take 1 capsule (300 mg total) by mouth 2 (two) times daily. 60 capsule 5  . levothyroxine (SYNTHROID, LEVOTHROID) 200 MCG tablet Take 200 mcg daily before breakfast 30 tablet 6  . magnesium gluconate (MAGONATE) 500 MG tablet Take 1,000 mg by mouth at bedtime.     . Multiple Vitamin (MULTIVITAMIN WITH MINERALS) TABS tablet Take 1 tablet by mouth at bedtime.    . Multiple Vitamins-Minerals (ICAPS PO) Take 1 tablet by mouth 2 (two) times daily.    . pantoprazole (PROTONIX) 40 MG tablet Take 1 tablet (40 mg total) by mouth 2 (two) times daily. 60 tablet 6  . RANEXA 500 MG 12 hr tablet TAKE 1 TABLET (500 MG TOTAL) BY MOUTH 2 (TWO) TIMES DAILY. 60 tablet 6  . nitroGLYCERIN (NITROSTAT) 0.4 MG SL tablet Place 1 tablet (0.4 mg total) under the tongue every 5 (five) minutes as needed for chest pain. (Patient not taking: Reported on 04/21/2015) 30 tablet 6  . oxyCODONE-acetaminophen (PERCOCET/ROXICET) 5-325 MG per tablet Take 1-2 tablets by mouth every 4 (four) hours as needed for moderate pain. (Patient not taking: Reported on 03/31/2015) 30 tablet 0  . promethazine (PHENERGAN) 12.5 MG tablet Take 1 tablet (12.5 mg total) by mouth every 6 (six) hours as needed for nausea or vomiting. (Patient not taking: Reported on 04/21/2015) 30 tablet 0  . warfarin (COUMADIN) 2 MG tablet Take 2mg  daily, except takes 4mg  on Monday  and Friday. 50 tablet 6  . zinc gluconate 50 MG tablet Take 100 mg by mouth at bedtime.      No current facility-administered medications for this encounter.    Demerol  REVIEW OF SYSTEMS: All systems negative except as listed in HPI, PMH and Problem list.  LVAD INTERROGATION:   HeartMate II LVAD:   Speed: 9200 RPM  Flow: 3.7  Power: 4.8  PI: 6.7  Alarms: no alarms  Events: Having about 25 PI events.  Fewer than last visit.  Fixed speed: 9200  Low speed limit: 8600  Primary Controller: Replace back up battery in 15 months (Feb 2017)  Back up controller: Replace back up battery in 15 months (Feb 2017)   I reviewed the LVAD parameters from today, and compared the results to the patient's prior recorded data.  Increased set speed to 9200 and back-up speed to 8600. The LVAD is functioning within specified parameters.  The patient performs LVAD self-test daily.  LVAD interrogation was negative for any significant power changes, alarms or PI events/speed drops.  LVAD equipment check completed and is in good working order.  Back-up equipment present.   LVAD education done on emergency procedures and precautions and reviewed exit site care.    Filed Vitals:   04/21/15 1319 04/21/15 1320  BP: 96/0 98/49  Pulse: 70   Height: 6' (1.829 m)   Weight: 201 lb 9.6 oz (91.445 kg)   SpO2: 96%     Physical Exam: GENERAL: NAD Ambulated in the clinic with cane. Wife present HEENT: normal  NECK: Supple, JVP 8-9.  2+ bilaterally, no bruits.  No lymphadenopathy or thyromegaly appreciated.   CARDIAC:  Mechanical heart sounds with LVAD hum present.  LUNGS:  Clear to auscultation bilaterally.  ABDOMEN:  Soft, round, nontender, positive bowel sounds x4.     LVAD exit site: well-healed and incorporated.  Dressing dry and intact.  No erythema or drainage.  Stabilization device present and accurately applied.  Driveline dressing is being changed weekly per sterile technique. EXTREMITIES:  Warm and dry,  no cyanosis, clubbing, bilateral  Trace edema NEUROLOGIC:  Alert and oriented x 4.  Gait steady.  No aphasia.  No dysarthria.  Affect pleasant.      ASSESSMENT AND PLAN:   1. Chronic systolic HF: s/p LVAD 53/2992 - NYHA II symptoms. Volume status looks mildly elevated. Continue lasix PRN.   - He is not on a BB with history of RV failure.  - MAP low today. Stop hydralazine. Continue amlodipine.  - No ACE-I d/t increased his renal function in the past.  - Reinforced the need and importance of daily weights, a low sodium diet, and fluid restriction (less than 2 L a day). Instructed to call the HF clinic if weight increases more than 3 lbs overnight or 5 lbs in a week.  2. Afib/Flutter - s/p DC-CV 06/04/14. With repeat DC-CV 03/01/25. Ok on [employer]. No Aifb.  Continue amio 200 bid. Continue adjunctive Ranexa. 2) HTN -MAP low today Stop hydralazine.  3) LVAD - LVAD parameters reviewed personally - stable.  - Multiple PI events - but stable - Check CMET, INR, LDH, pro-BNP, prealbumin, LDH and Magnesium today.   4) CKD, stage III - Check CMET today.  5) Anticoagulation management: - Check INR today and HF pharmacy will address accordingly 6) Hematuria - Resolved and stent has been removed.  7) S/P Renal Stone lithotripsy- Per Dr Risa Grill. 8) GIB  - s/p clipping of 2 jejunal AVMs in 4/16. HGB stable.  9) L4 compression fracture  - Improving. Continue HHPT.    CLEGG,AMY  NP-C  3:15 PM

## 2015-04-21 NOTE — Patient Instructions (Addendum)
1.  Stop Hydralazine  2.  No change in coumadin, re-check INR in two weeks 05/05/15. I will check with [facility] nurse and see if we can have checked at home. 3.  Return to [place] clinic in one month. 4.  Will extend Home Physical Therapy for as long as possible. 5.  Re-start Iron tablet once daily.

## 2015-04-21 NOTE — Progress Notes (Signed)
Presents today with [name] (significant other).   Symptom  Yes  No  Details   Angina       x Activity:   Claudication        x How far:   Syncope        x When:     Stroke        x        TIA  2001  Orthopnea         x How many pillows: 1  PND         x How often:  CPAP       N/A How many hrs:   Pedal edema        x        X 1 over last two weeks; relieved with prn lasix 20 mg   Abd fullness               x    N&V        x         "gagging episodes" daily  Diaphoresis               x   Bleeding              x         Urine color        yellow  SOB        x      Activity:  Incline; walking room to room; leg fatigue  Palpitations        x When:  ICD shock        x   Hospitlizaitons                 x When/where/why:   ED visit        x When/where/why:  Other MD             x   When/who/why:     Activity        Very limited with fatigue, muscle weakness, dyspnea. Walks but only short distances. Cannot work or Merchandiser, retail.   Fluid    No limitations  Diet    Low salt food   Vital signs: HR: 70 MAP BP: 96 Auto cuff:  98/49 (62)  O2 Sat:  96% Wt:  201.6 lbs Last wt: 201.6  lbs Ht: 6'0"  Home weights:  187 - 197 lbs  LVAD interrogation reveals:  Speed:   9200 RPM Flow:  3.7 Power: 4.8 PI:  6.8 Alarms: no alarms Events: @ 25 PI events daily Fixed speed:  9200  Low speed limit:  8600 Primary Controller:  Replace back up battery in 14 months  (Feb 2017) Back up controller:   Replace back up battery in 14 months (Feb 2017)   LVAD Exit Site:  VAD dressing intact with attachment device applied accurately and driveline secured. Drive line dressing change being performed weekly using sterile technique with Sorbaview dressing with biopatch over exit site.  Exit site well healed and incorporated. The velour is fully implanted at exit site. No redness, tenderness, drainage, or foul odor reported. Pt denies fever or chills. Pt/caregiver provided with adequate dressing supplies for home  use.    Pt/caregiver deny any alarms or VAD equipment issues. VAD coordinator reviewed daily log from home for daily temperature, weight, and VAD parameters. Pt is performing daily controller and system monitor self tests along with completing weekly and monthly maintenance  for LVAD equipment.   LVAD equipment check completed and is in good working order. Back-up equipment present. LVAD education done on emergency procedures and precautions and reviewed exit site care.   Pt reports improvement in back pain (from compression fracture) and presents to clinic today using cane only for support.  Does admit to 3 falls recently: 04/01/15 on uneven ground; 04/15/15 fall with rollator - "got tangled up", and fall yesterday with "dog pulling me" and tripped.  Also reports to falling out of bed last week during sleep.  Denies any injuries, bruising, or bleeding.    Also c/o "loose, watery" stools over last week; [name] has decreased stool softener to QOD.  Is asking if pt can re-start iron tablets?   Pt Instructions: 1.  Stop Hydralazine  2.  No change in coumadin, re-check INR in two weeks 05/05/15. I will check with [facility] nurse and see if we can have checked at home. 3.  Return to [place] clinic in one month. 4.  Will extend Home Physical Therapy for as long as possible. 5.  Re-start Iron tablet once daily.

## 2015-05-02 ENCOUNTER — Telehealth: Payer: Self-pay | Admitting: Infectious Diseases

## 2015-05-02 NOTE — Telephone Encounter (Signed)
[name] called VAD pager to report [name] has had a few episodes today where she saw his eyes were "spinning." I called to d/w [name] himself--he reports that he has had a history of these episodes where the room spins, last episode maybe over 2 months ago. He reports that when he rolled over this morning in the bed prior to rising he had an episode where the room was spinning after it subsided he stood to go to the bathroom where he had another episode same as described and brief lasting < 30 seconds; [name] came to his side after requesting her help and experienced a third episode where she witnessed what sounds like nystagmus. He denies feeling as if he would pass out, denies HA, N/V, double vision, or changes to his VAD parameters during events. No newly reported medications. He did take a lasix yesterday, however has never had this reaction to it in the past.   D/W [name] and informed [name] that they could monitor it over the next few days and we recommended that they follow up with his PCP to be evaluated for what sounds like vertigo. If he were to have an increase in symptoms or worsening (including passing out) that he were to need to come for evaluation. Verbalized understanding.

## 2015-05-05 ENCOUNTER — Ambulatory Visit (HOSPITAL_COMMUNITY): Payer: Self-pay | Admitting: Infectious Diseases

## 2015-05-05 LAB — PROTIME-INR: INR: 2.9 — AB (ref <=1.1)

## 2015-05-05 NOTE — Addendum Note (Signed)
Addended by: Zada Girt B on: 05/05/2015 03:04 PM   Modules accepted: Level of Service

## 2015-05-10 ENCOUNTER — Encounter (HOSPITAL_COMMUNITY): Payer: Self-pay | Admitting: *Deleted

## 2015-05-12 ENCOUNTER — Ambulatory Visit: Payer: Self-pay | Admitting: *Deleted

## 2015-05-12 LAB — PROTIME-INR: INR: 2.6 — AB (ref <=1.1)

## 2015-05-26 ENCOUNTER — Other Ambulatory Visit (HOSPITAL_COMMUNITY): Payer: Self-pay | Admitting: Infectious Diseases

## 2015-05-26 ENCOUNTER — Ambulatory Visit (HOSPITAL_COMMUNITY)
Admission: RE | Admit: 2015-05-26 | Discharge: 2015-05-26 | Disposition: A | Discharge status: Home / Self Care | Payer: Medicare Other | Source: Ambulatory Visit | Attending: Internal Medicine | Admitting: Internal Medicine

## 2015-05-26 ENCOUNTER — Ambulatory Visit (HOSPITAL_COMMUNITY): Payer: Self-pay | Admitting: Infectious Diseases

## 2015-05-26 VITALS — BP 88/0 | HR 64 | Ht 72.0 in | Wt 204.8 lb

## 2015-05-26 DIAGNOSIS — I5041 Acute combined systolic (congestive) and diastolic (congestive) heart failure: Secondary | ICD-10-CM

## 2015-05-26 DIAGNOSIS — I130 Hypertensive heart and chronic kidney disease with heart failure and stage 1 through stage 4 chronic kidney disease, or unspecified chronic kidney disease: Secondary | ICD-10-CM | POA: Diagnosis not present

## 2015-05-26 DIAGNOSIS — K922 Gastrointestinal hemorrhage, unspecified: Secondary | ICD-10-CM | POA: Insufficient documentation

## 2015-05-26 DIAGNOSIS — N183 Chronic kidney disease, stage 3 (moderate): Secondary | ICD-10-CM | POA: Insufficient documentation

## 2015-05-26 DIAGNOSIS — I4891 Unspecified atrial fibrillation: Secondary | ICD-10-CM | POA: Insufficient documentation

## 2015-05-26 DIAGNOSIS — Z7901 Long term (current) use of anticoagulants: Secondary | ICD-10-CM

## 2015-05-26 DIAGNOSIS — R319 Hematuria, unspecified: Secondary | ICD-10-CM | POA: Insufficient documentation

## 2015-05-26 DIAGNOSIS — I5022 Chronic systolic (congestive) heart failure: Secondary | ICD-10-CM | POA: Diagnosis not present

## 2015-05-26 DIAGNOSIS — E039 Hypothyroidism, unspecified: Secondary | ICD-10-CM | POA: Insufficient documentation

## 2015-05-26 DIAGNOSIS — Z95811 Presence of heart assist device: Secondary | ICD-10-CM | POA: Insufficient documentation

## 2015-05-26 LAB — CBC
HCT: 35.5 % — ABNORMAL LOW (ref 39.0–52.0)
Hemoglobin: 11.6 g/dL — ABNORMAL LOW (ref 13.0–17.0)
MCH: 29.8 pg (ref 26.0–34.0)
MCHC: 32.7 g/dL (ref 30.0–36.0)
MCV: 91.3 fL (ref 78.0–100.0)
Platelets: 166 10*3/uL (ref 150–400)
RBC: 3.89 MIL/uL — ABNORMAL LOW (ref 4.22–5.81)
RDW: 18.4 % — AB (ref 11.5–15.5)
WBC: 5 10*3/uL (ref 4.0–10.5)

## 2015-05-26 LAB — BASIC METABOLIC PANEL
Anion gap: 9 (ref 5–15)
BUN: 14 mg/dL (ref 6–20)
CO2: 24 mmol/L (ref 22–32)
CREATININE: 1.58 mg/dL — AB (ref 0.61–1.24)
Calcium: 8.8 mg/dL — ABNORMAL LOW (ref 8.9–10.3)
Chloride: 105 mmol/L (ref 101–111)
GFR calc Af Amer: 48 mL/min — ABNORMAL LOW (ref >60)
GFR calc non Af Amer: 41 mL/min — ABNORMAL LOW (ref >60)
Glucose, Bld: 103 mg/dL — ABNORMAL HIGH (ref 65–99)
Potassium: 4.5 mmol/L (ref 3.5–5.1)
Sodium: 138 mmol/L (ref 135–145)

## 2015-05-26 LAB — PROTIME-INR
INR: 3.19 — AB (ref 0.00–1.49)
Prothrombin Time: 32 seconds — ABNORMAL HIGH (ref 11.6–15.2)

## 2015-05-26 LAB — LACTATE DEHYDROGENASE: LDH: 279 U/L — AB (ref 98–192)

## 2015-05-26 NOTE — Progress Notes (Signed)
Presents today with [name] (significant other).   Symptom  Yes  No  Details   Angina       x Activity:   Claudication        x How far:   Syncope        x When:     Stroke        x        TIA  2001  Orthopnea         x How many pillows: 1  PND         x How often:  CPAP       N/A How many hrs:   Pedal edema        x        R>L baseline. Wearing compression stockings at work while walking around.   Abd fullness               x    N&V        x         "gagging episodes" more often recently with episodes 1 - 2 times a day   Diaphoresis               x   Bleeding       x         occasional nosebleed when "blows his nose"  Urine color        yellow  SOB        x      Activity: incline, no more than normal.   Palpitations        x When:   ICD shock        x   Hospitlizaitons                 x When/where/why:   ED visit        x When/where/why:  Other MD             x   When/who/why:     Activity        Very limited with back pain; PT coming to home 2x weekly. Can do some exercises that PT gave him. Walks but only short distances. Cannot work or Merchandiser, retail.   Fluid    No limitations  < 2000   Diet    Low salt food   Vital signs: HR: 64 MAP BP: 90 Auto cuff: 97/72 (84) O2 Sat:  98% Wt:  204.8 lbs Last wt: 201.6 lbs (Home weights 196 - 199).  Ht: 6'0"  LVAD interrogation reveals:  Speed:   9200  Flow: 4.0 Power: 4.9 PI: 3.2 - 5.8 Alarms: low voltage alarms, few  Events: events happening about  5 - 15 every 2 days. Outliers on some days 14 - 28.  Fixed speed:  9200  Low speed limit:  8600 Primary Controller:  Replace back up battery in 13 months  (Feb 2017) Back up controller:   Replace back up battery in 13 months (Feb 2017)  Encounter Details:  In today for follow up with his significant other [name]. Arrived walking independently without an aid. Stopped physical therapy at home and has returned to work about 10 days ago. Doing well, reports being a little tired but making it.  Back is better with some limitations (walking slower and not twisting a certain way). Still having some diarrhea--once a day to three times a day, has decreased some since iron was restarted.   Complaining  of more gagging episodes. Does not bring anything up with these episodes but they are more frequent.  PI events down today compared to normal (baseline around 6 - 7). Today he is 3.2 - 5.8. Device interrogated to assess for AF/VF which was negative. Walked 1.5 miles a few times recently--was wore out but still able to complete ADLs and function.   Has not taken lasix in some time. Legs are markedly swollen today (R>L). Instructed by Amy to take lasix today and if swelling does not clear by Saturday take another.   Janene Madeira, RN VAD Coordinator

## 2015-05-26 NOTE — Progress Notes (Signed)
Patient ID: [name], male   DOB: 01-13-1941, 74 y.o.   MRN: [id number] PCP: Dr. Karolee Stamps ([place])  [name] is a 74 yo male with h/o VT, PAD and severe systolic HF (EF 54-62%) due to mixed ischemic/nonischemic CM he is s/p HM II LVAD implant for destination therapy on October 19, 2013. On 09/01/14 underwent cystoscopy and flexible ureteroscopy with lithotripsy and basket extraction.   VAD implantation was complicated by VT, syncope, A fib/Aflutter and persistent low output felt to be due to RHF. RHC showed low cardiac output but no evidence of RV failure on Milrinone. He was felt to be volume depleted. He was discharged home on 11/18/13 on mirlinone and PRN lasix 20 mg for a weight of 200 pounds or greater. Milrinone has since been weaned off.   RHC (07/13/14): Well compensated hemodynamics with VAD support.  Admitted 11/9-11/19/15 for hematuria and chronic nephrolithiasis. Underwent Echo to assess RV for concern RV failure because he presented with SOB. RV looked good and had renal US showed mild hyronephrosis and bilateral renal cysts. Dr. Risa [name] took back for repeat lithotripsy and stone extraction by ureteroscopy and J stent placement. Received total of 3 PRBCs. D/C weight 188 lbs.   Over past month has had 3 admits. In 4/16 initially admitted with weakness and fall. In ER hemoglobin was found to be 5.4 with MAP 78.  While hospitalized he received 5UPRBCs. 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 Then readmitted with acute L4 compression fracture. Most recently admitted with weakness and low PI. 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 Back pain improved. Back to work about 10 days ago. Denies SOB/PND/Orthopnea. Increased lower extremity edema.  Weight at home 196-199 pounds. Has not had lasix in the last several week. No BRBPR.  Increased gagging reported.   Denies LVAD alarms.  Denies driveline trauma, erythema or drainage.  Denies ICD shocks. Reports taking Coumadin as prescribed and adherence to anticoagulation based dietary restrictions.  Denies bright red blood per rectum or melena, no dark urine or hematuria.    ICD interrogated at bedside: No VT/AF. Optivol fluid level down but starting to increase slowly. Activity level very low but now starting to come back up.   Past Medical History  Diagnosis Date  . Bradycardia   . Ventricular tachycardia     s/p AICD;   h/o ICD shock;  Amiodarone Rx. S/P ablation in 2012  . 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 HTN (hypertension)   . HLD (hyperlipidemia)   . Cytopenia   . Hypothyroidism   . Renal insufficiency   . Claudication   . Chronic systolic heart failure   . Ischemic cardiomyopathy     echo 7/12:  EF 25-30%, mild AI, mod MR, mod LAE  . TIA (transient ischemic attack) 2001  . CVD (cerebrovascular disease)     left CEA 2008  . Stroke   . Anemia   . Inappropriate therapy from implantable cardioverter-defibrillator 04/02/2013    ATP for sinus tach  SVT wavelet identified SVT but was no  passive   . Myocardial infarction 1992  . Anginal pain   . Automatic  implantable cardioverter-defibrillator in situ     Medtronic Evera device serial number [id number]    . Arthritis     "a little in my knees" (05/25/2013)  . Melanoma of ear 2013    "near left ear" (05/25/2013)  . 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 Dysrhythmia     HX A FIB  . History of nonmelanoma skin cancer     L EAR  . Kidney stone   . History of transfusion   . Sleep apnea     denies wearing mask (05/25/2013)  . CAD (coronary artery disease)     s/p MI 1982;  s/p DES to CFX 2011;  s/p NSTEMI 4/12 in setting of VTach;  cath 7/12:  LM ok, LAD mid 30-40%, Dx's  with 40%, mCFX stent patent, prox to mid RCA occluded with R to R and L to R collats.  Medical Tx was continued  . CHF (congestive heart failure)     HAS VENTRICULAR PUMP    Current Outpatient Prescriptions  Medication Sig Dispense Refill  . acetaminophen (TYLENOL) 325 MG tablet Take 650 mg by mouth every 6 (six) hours as needed (pain).    [name] amiodarone (PACERONE) 200 MG tablet Take 2 tablets (400 mg total) by mouth 2 (two) times daily. 120 tablet 6  . amLODipine (NORVASC) 10 MG tablet Take 1 tablet (10 mg total) by mouth daily. 30 tablet 6  . atorvastatin (LIPITOR) 80 MG tablet Take 80 mg by mouth daily.     . ferrous gluconate (FERGON) 324 MG tablet Take 1 tablet (324 mg total) by mouth daily with breakfast. 30 tablet 6  . furosemide (LASIX) 20 MG tablet Take 20 mg by mouth daily as needed for fluid.     [name] levothyroxine (SYNTHROID, LEVOTHROID) 200 MCG tablet Take 200 mcg daily before breakfast 30 tablet 6  . magnesium gluconate (MAGONATE) 500 MG tablet Take 1,000 mg by mouth at bedtime.     . Multiple Vitamin (MULTIVITAMIN WITH MINERALS) TABS tablet Take 1 tablet by mouth at bedtime.    . nitroGLYCERIN (NITROSTAT) 0.4 MG SL tablet Place 1 tablet (0.4 mg total) under the tongue every 5 (five) minutes as needed for chest pain. 30 tablet 6  . pantoprazole (PROTONIX) 40 MG tablet Take 1 tablet (40 mg total) by mouth 2 (two) times daily. 60 tablet 6  . promethazine (PHENERGAN) 12.5 MG tablet Take 1 tablet (12.5 mg total) by mouth every 6 (six) hours as needed for nausea or vomiting. 30 tablet 0  . RANEXA 500 MG 12 hr tablet TAKE 1 TABLET (500 MG TOTAL) BY MOUTH 2 (TWO) TIMES DAILY. 60 tablet 6  . warfarin (COUMADIN) 2 MG tablet Take 2mg  daily, except takes 4mg  on Monday and Friday. (Patient taking differently: Take 2mg  daily, except takes 4mg  on Monday) 50 tablet 6  . zinc gluconate 50 MG tablet Take 100 mg by mouth at bedtime.     . docusate sodium (COLACE) 100 MG capsule Take 200 mg by mouth  daily. Taking every other day    . gabapentin (NEURONTIN) 300 MG capsule Take 1 capsule (300 mg total) by mouth 2 (two) times daily. (Patient not taking: Reported on 05/26/2015) 60 capsule 5   No current facility-administered medications for this encounter.    Demerol  REVIEW OF SYSTEMS: All systems negative except as listed in HPI, PMH and Problem list.  LVAD INTERROGATION:   HeartMate II LVAD:   Speed: 9200 RPM  Flow:  3.2  Power: 4.6 PI: 3.5-4.8   Alarms: no alarms  Events: Having about 19-50 events.  Fixed speed: 9200  Low speed limit: 8600  Primary Controller: Replace back up battery  Back up controller: Replace back up battery  I reviewed the LVAD parameters from today, and compared the results to the patient's prior recorded data.  Increased set speed to 9200 and back-up speed to 8600. The LVAD is functioning within specified parameters.  The patient performs LVAD self-test daily.  LVAD interrogation was negative for any significant power changes, alarms or PI events/speed drops.  LVAD equipment check completed and is in good working order.  Back-up equipment present.   LVAD education done on emergency procedures and precautions and reviewed exit site care.    Filed Vitals:   05/26/15 1017  BP: 88/0  Pulse: 64  Height: 6' (1.829 m)  Weight: 204 lb 12.8 oz (92.897 kg)  SpO2: 98%    Physical Exam: GENERAL: NAD Ambulated in the clinic. Wife present HEENT: normal  NECK: Supple, JVP 9-10  2+ bilaterally, no bruits.  No lymphadenopathy or thyromegaly appreciated.   CARDIAC:  Mechanical heart sounds with LVAD hum present.  LUNGS:  Clear to auscultation bilaterally.  ABDOMEN:  Soft, round, nontender, positive bowel sounds x4.     LVAD exit site: well-healed and incorporated.  Dressing dry and intact.  No erythema or drainage.  Stabilization device present and accurately applied.  Driveline dressing is being changed weekly per sterile technique. EXTREMITIES:  Warm and dry, no  cyanosis, clubbing, R>L 2+ edema/ /1+ edema.  NEUROLOGIC:  Alert and oriented x 4.  Gait steady.  No aphasia.  No dysarthria.  Affect pleasant.      ASSESSMENT AND PLAN:   1. Chronic systolic HF: s/p LVAD 82/4235 - NYHA II symptoms. Volume status elevated. Asked to take 20 mg lasix today then continue lasix PRN.   - He is not on a BB with history of RV failure.  - MAP today 84 . Off hydralazine. Continue amlodipine.  - No ACE-I d/t increased his renal function in the past.  - Reinforced the need and importance of daily weights, a low sodium diet, and fluid restriction (less than 2 L a day). Instructed to call the HF clinic if weight increases more than 3 lbs overnight or 5 lbs in a week.  2. Afib/Flutter - s/p DC-CV 06/04/14. With repeat DC-CV 03/01/25. No Aifb on optivol..  Cut back amio to 200 mg twice a day for 1 week then decrease to 200 mg weekly. Continue adjunctive Ranexa. 2) HTN -MAP ok today. ow today Stop hydralazine.  3) LVAD - LVAD parameters reviewed personally - stable.  - Multiple PI events - but stable - Check CMET, INR, LDH, pro-BNP, prealbumin, LDH and Magnesium today.   4) CKD, stage III - Check CMET today.  5) Anticoagulation management: Goal INR 2-2.5  - Check INR today and HF pharmacy will address accordingly 6) Hematuria - Resolved and stent has been removed.  7) S/P Renal Stone lithotripsy- Per Dr [name]. 8) GIB  - s/p clipping of 2 jejunal AVMs in 4/16. HGB stable.  9) L4 compression fracture  - Improving. 10)  Hypothyroid- TSH 20 in April 2016 . On Synthroid. Repeat next week. TSH T3T4.   Follow next week for INR TSH T3 T4.   Follow up 4 weeks.   [name],AMY  NP-C  10:35 AM   Patient seen and examined with Frank Grinder, NP. We discussed all  aspects of the encounter. I agree with the assessment and plan as stated above.   Doing well. Starting to feel better. Back pain improved. Volume status up. Agree with lasix. ICD interrogation looks good. Will cut back  amio. Recheck TFTs. VAD parameters ok.   Frank Haston,MD 11:34 AM

## 2015-05-26 NOTE — Patient Instructions (Addendum)
1. Take one lasix today for swelling in your legs today. If the swelling does not get better take another dose on Saturday.   2. INR is 3.19. Do not take coumadin today. Then we will decrease your dose to 2 mg every day (no more 4 mg days).   Next INR on 06/01/15  3. Decrease your Amiodarone to 200 mg (one pill) twice a day for one week starting today   Starting 06/02/15 you will decrease it again to 200 mg (one pill) once a day   We will likely need to give you more coumadin to accommodate for this change and do more frequent INR checks.   4. We need to do some more lab work to check your thyroid next week when we re-do your INR and your magnesium level. Make sure [facility] is taking enough blood for him.    5. Hemoglobin today is 11.6--continue taking your Iron. If you should start with any constipation or very firm stools.

## 2015-05-31 ENCOUNTER — Other Ambulatory Visit: Payer: Self-pay | Admitting: Infectious Diseases

## 2015-05-31 ENCOUNTER — Other Ambulatory Visit (HOSPITAL_COMMUNITY): Payer: Self-pay | Admitting: *Deleted

## 2015-05-31 ENCOUNTER — Ambulatory Visit (HOSPITAL_COMMUNITY)
Admission: RE | Admit: 2015-05-31 | Discharge: 2015-05-31 | Disposition: A | Discharge status: Home / Self Care | Payer: Medicare Other | Source: Ambulatory Visit | Attending: Adult Health | Admitting: Adult Health

## 2015-05-31 ENCOUNTER — Ambulatory Visit (HOSPITAL_COMMUNITY)
Admission: RE | Admit: 2015-05-31 | Discharge: 2015-05-31 | Disposition: A | Discharge status: Home / Self Care | Payer: Medicare Other | Source: Ambulatory Visit | Attending: Cardiology | Admitting: Cardiology

## 2015-05-31 ENCOUNTER — Telehealth (HOSPITAL_COMMUNITY): Payer: Self-pay | Admitting: Infectious Diseases

## 2015-05-31 ENCOUNTER — Ambulatory Visit (HOSPITAL_COMMUNITY): Payer: Self-pay | Admitting: *Deleted

## 2015-05-31 VITALS — BP 116/77 | HR 69 | Ht 72.0 in | Wt 205.2 lb

## 2015-05-31 DIAGNOSIS — R531 Weakness: Secondary | ICD-10-CM | POA: Diagnosis not present

## 2015-05-31 DIAGNOSIS — Z95811 Presence of heart assist device: Secondary | ICD-10-CM

## 2015-05-31 DIAGNOSIS — I471 Supraventricular tachycardia: Secondary | ICD-10-CM

## 2015-05-31 DIAGNOSIS — R05 Cough: Secondary | ICD-10-CM

## 2015-05-31 DIAGNOSIS — R509 Fever, unspecified: Secondary | ICD-10-CM | POA: Diagnosis not present

## 2015-05-31 DIAGNOSIS — I5022 Chronic systolic (congestive) heart failure: Secondary | ICD-10-CM

## 2015-05-31 DIAGNOSIS — I480 Paroxysmal atrial fibrillation: Secondary | ICD-10-CM

## 2015-05-31 DIAGNOSIS — E079 Disorder of thyroid, unspecified: Secondary | ICD-10-CM

## 2015-05-31 DIAGNOSIS — H538 Other visual disturbances: Secondary | ICD-10-CM | POA: Diagnosis not present

## 2015-05-31 DIAGNOSIS — R059 Cough, unspecified: Secondary | ICD-10-CM

## 2015-05-31 DIAGNOSIS — Z7901 Long term (current) use of anticoagulants: Secondary | ICD-10-CM | POA: Diagnosis not present

## 2015-05-31 DIAGNOSIS — Z79899 Other long term (current) drug therapy: Secondary | ICD-10-CM

## 2015-05-31 DIAGNOSIS — I4891 Unspecified atrial fibrillation: Secondary | ICD-10-CM | POA: Diagnosis not present

## 2015-05-31 DIAGNOSIS — R0989 Other specified symptoms and signs involving the circulatory and respiratory systems: Secondary | ICD-10-CM

## 2015-05-31 DIAGNOSIS — R198 Other specified symptoms and signs involving the digestive system and abdomen: Secondary | ICD-10-CM

## 2015-05-31 LAB — PROTIME-INR
INR: 2.4 — ABNORMAL HIGH (ref 0.00–1.49)
Prothrombin Time: 25.9 seconds — ABNORMAL HIGH (ref 11.6–15.2)

## 2015-05-31 LAB — TSH: TSH: 9.566 u[IU]/mL — AB (ref 0.350–4.500)

## 2015-05-31 LAB — CBC
HEMATOCRIT: 32.9 % — AB (ref 39.0–52.0)
HEMOGLOBIN: 10.8 g/dL — AB (ref 13.0–17.0)
MCH: 30.1 pg (ref 26.0–34.0)
MCHC: 32.8 g/dL (ref 30.0–36.0)
MCV: 91.6 fL (ref 78.0–100.0)
PLATELETS: 162 10*3/uL (ref 150–400)
RBC: 3.59 MIL/uL — ABNORMAL LOW (ref 4.22–5.81)
RDW: 18.5 % — AB (ref 11.5–15.5)
WBC: 5.8 10*3/uL (ref 4.0–10.5)

## 2015-05-31 LAB — T4, FREE: Free T4: 1.21 ng/dL — ABNORMAL HIGH (ref 0.61–1.12)

## 2015-05-31 MED ORDER — AMIODARONE HCL 200 MG PO TABS: 200.0000 mg | ORAL_TABLET | Freq: Every day | ORAL | Status: DC

## 2015-05-31 NOTE — Telephone Encounter (Signed)
Page received from [name] about [name]. He went to PCP yesterday with cold symptoms. Reported that he had some blood work done (WBC was reportedly normal). He has been having "low grade fevers 99.0, congestion, sore throat, weak legs, lack of energy and dizziness. Denies any cough. Was prescribed Tussinex for PM cough and continues to feel worse today. They did not do a CXR yesterday. Requested if he could be seen here in VAD clinic today. Appointment made at 2:00 PM. Team updated.

## 2015-05-31 NOTE — Patient Instructions (Addendum)
1.  Take iron and amiodarone with food. 2.  Decrease Amiodarone to 200 mg daily. 3.  If chest congestion, cough, or fever still present Friday, call VAD office for antibiotic.  4.  GI referral for chronic cough/gagging episodes.  5.  No changes in coumadin dose; re-check in one week on 06/07/15. 6.  Return to VAD clinic on 06/23/15.

## 2015-05-31 NOTE — Progress Notes (Addendum)
Presents today with [name] (significant other).   Symptom  Yes  No  Details   Angina       x Activity:   Claudication        x How far:   Syncope        x When:     Stroke        x        TIA  2001  Orthopnea         x How many pillows: 1  PND         x How often:  CPAP       N/A How many hrs:   Pedal edema        x        R>L baseline. Wearing compression stockings at work while walking around.   Abd fullness               x    N&V        x         "gagging episodes" more often recently with episodes 2 - 3 times a day   Diaphoresis               x   Bleeding       x         occasional nosebleed when "blows his nose"  Urine color        yellow  SOB        x      Activity: incline, no more than normal.   Palpitations        x When:   ICD shock        x   Hospitlizaitons                 x When/where/why:   ED visit        x When/where/why:  Other MD        x       When/who/why:   Dr. Rowe Clack for chest congestion, sore throat, and cough. WBC nl  Activity        Working  Fluid    No limitations  < 2000   Diet    Low salt food   Vital signs: HR: 69 MAP BP: 98 Auto cuff:  116/77 (100) O2 Sat:  95% Wt:  205.2  lbs Last wt: 204.8  lbs (Home weights 196 - 198).  Ht: 6'0"  LVAD interrogation reveals:  Speed:   9200  Flow: 4.6 Power: 5.1 PI: 6.3 Alarms: none Events:  3 - 5 PI events Fixed speed:  9200  Low speed limit:  8600 Primary Controller:  Replace back up battery in 13 months  (Feb 2017) Back up controller:   Replace back up battery in 13 months (Feb 2017)   CXR obatined and reviewed by Darrick Grinder, NP, pt will call VAD office if no improvement in symptoms by Friday of this week.   Pt Instructions: 1.  Take iron and amiodarone with food. 2.  Decrease Amiodarone to 200 mg daily. 3.  If chest congestion, cough, or fever still present Friday, call VAD office for antibiotic.  4.  GI referral for chronic cough/gagging episodes.  5.  No changes in coumadin dose; re-check in  one week on 06/07/15. 6.  Return to VAD clinic on 06/23/15.

## 2015-05-31 NOTE — Progress Notes (Signed)
Patient ID: [name], male   DOB: 20-May-1941, 74 y.o.   MRN: [id number] PCP: Dr. Karolee Stamps ([place])  [name] is a 74 yo male with h/o VT, PAD and severe systolic HF (EF 93-23%) due to mixed ischemic/nonischemic CM he is s/p HM II LVAD implant for destination therapy on October 19, 2013. On 09/01/14 underwent cystoscopy and flexible ureteroscopy with lithotripsy and basket extraction.   VAD implantation was complicated by VT, syncope, A fib/Aflutter and persistent low output felt to be due to RHF. RHC showed low cardiac output but no evidence of RV failure on Milrinone. He was felt to be volume depleted. He was discharged home on 11/18/13 on mirlinone and PRN lasix 20 mg for a weight of 200 pounds or greater. Milrinone has since been weaned off.   RHC (07/13/14): Well compensated hemodynamics with VAD support.  Admitted 11/9-11/19/15 for hematuria and chronic nephrolithiasis. Underwent Echo to assess RV for concern RV failure because he presented with SOB. RV looked good and had renal US showed mild hyronephrosis and bilateral renal cysts. Dr. Risa Grill took back for repeat lithotripsy and stone extraction by ureteroscopy and J stent placement. Received total of 3 PRBCs. D/C weight 188 lbs.   Over past month has had 3 admits. In 4/16 initially admitted with weakness and fall. In ER hemoglobin was found to be 5.4 with MAP 78.  While hospitalized he received 5UPRBCs. 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 Then readmitted with acute L4 compression fracture. Most recently admitted with weakness and low PI. 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 Saw PCP --thought to be viral. Gaging 2-3 times a day. Has not required lasix in the last few days. Working 5 hours per day.  No BRBPR. Taking all meds without food.    Denies LVAD alarms.  Denies driveline trauma, erythema or drainage.  Denies ICD shocks. Reports taking Coumadin as prescribed and adherence to anticoagulation based dietary restrictions.  Denies bright red blood per rectum or melena, no dark urine or hematuria.    ICD interrogated at bedside: No VT/AF. Optivol fluid level down but starting to increase slowly. Activity level very low but now starting to come back up.   Past Medical History  Diagnosis Date  . Bradycardia   . Ventricular tachycardia     s/p AICD;   h/o ICD shock;  Amiodarone Rx. S/P ablation in 2012  . 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 HTN (hypertension)   . HLD (hyperlipidemia)   . Cytopenia   . Hypothyroidism   . Renal insufficiency   . Claudication   . Chronic systolic heart failure   . Ischemic cardiomyopathy     echo 7/12:  EF 25-30%, mild AI, mod MR, mod LAE  . TIA (transient ischemic attack) 2001  . CVD (cerebrovascular disease)     left CEA 2008  . Stroke   . Anemia   . Inappropriate therapy from implantable cardioverter-defibrillator 04/02/2013    ATP for sinus tach  SVT wavelet identified SVT but was no  passive   . Myocardial infarction 1992  . Anginal pain   .  Automatic implantable cardioverter-defibrillator in situ     Medtronic Evera device serial number [id number]    . Arthritis     "a little in my knees" (05/25/2013)  . Melanoma of ear 2013    "near left ear" (05/25/2013)  . 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 Dysrhythmia     HX A FIB  . History of nonmelanoma skin cancer     L EAR  . Kidney stone   . History of transfusion   . Sleep apnea     denies wearing mask (05/25/2013)  . CAD (coronary artery disease)     s/p MI 1982;  s/p DES to CFX 2011;  s/p NSTEMI 4/12 in setting of VTach;  cath 7/12:  LM ok, LAD mid 30-40%, Dx's with 40%, mCFX stent patent,  prox to mid RCA occluded with R to R and L to R collats.  Medical Tx was continued  . CHF (congestive heart failure)     HAS VENTRICULAR PUMP    Current Outpatient Prescriptions  Medication Sig Dispense Refill  . acetaminophen (TYLENOL) 325 MG tablet Take 650 mg by mouth every 6 (six) hours as needed (pain).    [name] amiodarone (PACERONE) 200 MG tablet Take 2 tablets (400 mg total) by mouth 2 (two) times daily. 120 tablet 6  . amLODipine (NORVASC) 10 MG tablet Take 1 tablet (10 mg total) by mouth daily. 30 tablet 6  . atorvastatin (LIPITOR) 80 MG tablet Take 80 mg by mouth daily.     [name] docusate sodium (COLACE) 100 MG capsule Take 200 mg by mouth daily. Taking every other day    . ferrous gluconate (FERGON) 324 MG tablet Take 1 tablet (324 mg total) by mouth daily with breakfast. 30 tablet 6  . furosemide (LASIX) 20 MG tablet Take 20 mg by mouth daily as needed for fluid.     [name] gabapentin (NEURONTIN) 300 MG capsule Take 1 capsule (300 mg total) by mouth 2 (two) times daily. (Patient not taking: Reported on 05/26/2015) 60 capsule 5  . levothyroxine (SYNTHROID, LEVOTHROID) 200 MCG tablet Take 200 mcg daily before breakfast 30 tablet 6  . magnesium gluconate (MAGONATE) 500 MG tablet Take 1,000 mg by mouth at bedtime.     . Multiple Vitamin (MULTIVITAMIN WITH MINERALS) TABS tablet Take 1 tablet by mouth at bedtime.    . nitroGLYCERIN (NITROSTAT) 0.4 MG SL tablet Place 1 tablet (0.4 mg total) under the tongue every 5 (five) minutes as needed for chest pain. 30 tablet 6  . pantoprazole (PROTONIX) 40 MG tablet Take 1 tablet (40 mg total) by mouth 2 (two) times daily. 60 tablet 6  . promethazine (PHENERGAN) 12.5 MG tablet Take 1 tablet (12.5 mg total) by mouth every 6 (six) hours as needed for nausea or vomiting. 30 tablet 0  . RANEXA 500 MG 12 hr tablet TAKE 1 TABLET (500 MG TOTAL) BY MOUTH 2 (TWO) TIMES DAILY. 60 tablet 6  . warfarin (COUMADIN) 2 MG tablet Take 2mg  daily, except takes 4mg  on Monday and  Friday. (Patient taking differently: Take 2mg  daily, except takes 4mg  on Monday) 50 tablet 6  . zinc gluconate 50 MG tablet Take 100 mg by mouth at bedtime.      No current facility-administered medications for this encounter.    Demerol  REVIEW OF SYSTEMS: All systems negative except as listed in HPI, PMH and Problem list.  LVAD INTERROGATION:   HeartMate II LVAD:   Speed: 9200 RPM  Flow: 4.1  Power: 5.0  PI: 6.2  Alarms: no alarms  Events: Having about 10-20 events.  Fixed speed: 9200  Low speed limit: 8600  Primary Controller: Replace back up battery  Back up controller: Replace back up battery  I reviewed the LVAD parameters from today, and compared the results to the patient's prior recorded data.  Increased set speed to 9200 and back-up speed to 8600. The LVAD is functioning within specified parameters.  The patient performs LVAD self-test daily.  LVAD interrogation was negative for any significant power changes, alarms or PI events/speed drops.  LVAD equipment check completed and is in good working order.  Back-up equipment present.   LVAD education done on emergency procedures and precautions and reviewed exit site care.    Filed Vitals:   05/31/15 1350 05/31/15 1351  BP: 98/0 116/77  Pulse: 69   Height: 6' (1.829 m)   Weight: 205 lb 3.2 oz (93.078 kg)   SpO2: 95%     Physical Exam: GENERAL: NAD Ambulated in the clinic. Wife present HEENT: normal  NECK: Supple, JVP flat  2+ bilaterally, no bruits.  No lymphadenopathy or thyromegaly appreciated.   CARDIAC:  Mechanical heart sounds with LVAD hum present.  LUNGS:  RML RLL rhonchi.  ABDOMEN:  Soft, round, nontender, positive bowel sounds x4.     LVAD exit site: well-healed and incorporated.  Dressing dry and intact.  No erythema or drainage.  Stabilization device present and accurately applied.  Driveline dressing is being changed weekly per sterile technique. EXTREMITIES:  Warm and dry, no cyanosis, clubbing, R>L 2+  edema/ /1+ edema. RLE ted hose  NEUROLOGIC:  Alert and oriented x 4.  Gait steady.  No aphasia.  No dysarthria.  Affect pleasant.      ASSESSMENT AND PLAN:   1. Chronic systolic HF: s/p LVAD 41/7408 - NYHA II symptoms. Volume status stable. Continue  20 mg lasix as needed.    - He is not on a BB with history of RV failure.  - MAP a little hight but ok. Off hydralazine. Continue amlodipine.  - No ACE-I d/t increased his renal function in the past.  - Reinforced the need and importance of daily weights, a low sodium diet, and fluid restriction (less than 2 L a day). Instructed to call the HF clinic if weight increases more than 3 lbs overnight or 5 lbs in a week.  2. Afib/Flutter - s/p DC-CV 06/04/14. With repeat DC-CV 03/01/25. No Aifb on optivol.  Cut back amio to 200 daily and see if that helps with nausea/gagging. Continue adjunctive Ranexa. 2) HTN -MAP ok today.Continue current regimen 3) LVAD - LVAD parameters reviewed personally - stable.  - Multiple PI events - but stable - 4) CKD, stage III -BMET ok 7/21  5) Anticoagulation management: Goal INR 2-2.5  - Check INR today and HF pharmacy will address accordingly 6) Hematuria - Resolved and stent has been removed.  7) S/P Renal Stone lithotripsy- Per Dr Risa Grill. 8) GIB  - s/p clipping of 2 jejunal AVMs in 4/16. HGB stable.  9) L4 compression fracture  - Improving. Back to work with limited  60)  Hypothyroid- TSH 20 in April 2016 . On Synthroid.  TSH coming down.  11) Cough-  Will get PA lateral---> Xray ok. No evidence of infiltrates.   12) Gagging 3-4 times per day. Refer GI . ? GERD but on protonix.  I have reviewed medications with pharmacy for potential problems with meds.  Follow next week for INR TSH T3 T4.   Follow up 4 weeks.   CLEGG,AMY  NP-C  2:06 PM   Patient seen and examined with Darrick Grinder, NP. We discussed all aspects of the encounter. I agree with the assessment and plan as stated above.   Overall stable.  Suspect he has viral bronchitis. Will send for CXR. Otherwise continue current regimen. VAD parameters reviewed and are stable. Labs and rhythm look good.   Bensimhon, Daniel,MD 11:22 PM

## 2015-06-01 DIAGNOSIS — R059 Cough, unspecified: Secondary | ICD-10-CM | POA: Insufficient documentation

## 2015-06-01 DIAGNOSIS — R05 Cough: Secondary | ICD-10-CM | POA: Insufficient documentation

## 2015-06-01 LAB — T3, FREE: T3 FREE: 1.4 pg/mL — AB (ref 2.0–4.4)

## 2015-06-02 ENCOUNTER — Ambulatory Visit (HOSPITAL_COMMUNITY): Payer: Self-pay | Admitting: Infectious Diseases

## 2015-06-02 ENCOUNTER — Telehealth (HOSPITAL_COMMUNITY): Payer: Self-pay | Admitting: *Deleted

## 2015-06-02 LAB — PROTIME-INR: INR: 2.5 — AB (ref <=1.1)

## 2015-06-02 NOTE — Progress Notes (Signed)
A user error has taken place: charting done on wrong patient and has been corrected.

## 2015-06-02 NOTE — Telephone Encounter (Signed)
Wife called to report increased number of "gagging" episodes today. She is requesting appt with GI asap. Appointment made with Dr. Benson Norway at [facility] for Monday, 06/06/15 at 10:30, appt information, location, and contact info given to [facility].  She verbalized understanding of above.

## 2015-06-03 ENCOUNTER — Telehealth: Payer: Self-pay | Admitting: Infectious Diseases

## 2015-06-03 MED ORDER — DOXYCYCLINE HYCLATE 100 MG PO CAPS: 100.0000 mg | ORAL_CAPSULE | Freq: Two times a day (BID) | ORAL | Status: DC

## 2015-06-03 NOTE — Telephone Encounter (Signed)
Called re: [name] still being "congested and sick" complaining of same symptoms he had earlier this week (low grade fever, congestion, green/yellow mucus). D/W Amy and will prescribe 7 day course of Doxycycline 100 mg BID for possible bacterial infection. Patient has home INR machine now and will be performing weekly POC testing.   Updated regarding endocrinology appointment advised by Dr. Haroldine Laws to manage TFT abnormalities. Advised we will be working on earlier appointment with Dr. Baldwin Crown office. GI consult for gagging episodes Monday 06/06/15.

## 2015-06-06 ENCOUNTER — Other Ambulatory Visit (HOSPITAL_COMMUNITY): Payer: Self-pay

## 2015-06-06 MED ORDER — DOXYCYCLINE HYCLATE 100 MG PO CAPS: 100.0000 mg | ORAL_CAPSULE | Freq: Two times a day (BID) | ORAL | Status: DC

## 2015-06-09 ENCOUNTER — Ambulatory Visit (HOSPITAL_COMMUNITY): Payer: Medicare Other | Admitting: *Deleted

## 2015-06-09 LAB — POCT INR: INR: 2.7

## 2015-06-10 ENCOUNTER — Ambulatory Visit (INDEPENDENT_AMBULATORY_CARE_PROVIDER_SITE_OTHER): Payer: Medicare Other | Admitting: Internal Medicine

## 2015-06-10 ENCOUNTER — Encounter: Payer: Self-pay | Admitting: Internal Medicine

## 2015-06-10 VITALS — Ht 72.0 in | Wt 205.0 lb

## 2015-06-10 DIAGNOSIS — Z95810 Presence of automatic (implantable) cardiac defibrillator: Secondary | ICD-10-CM | POA: Diagnosis not present

## 2015-06-10 DIAGNOSIS — I5022 Chronic systolic (congestive) heart failure: Secondary | ICD-10-CM | POA: Diagnosis not present

## 2015-06-10 DIAGNOSIS — Z45018 Encounter for adjustment and management of other part of cardiac pacemaker: Secondary | ICD-10-CM

## 2015-06-10 DIAGNOSIS — Z95811 Presence of heart assist device: Secondary | ICD-10-CM

## 2015-06-10 DIAGNOSIS — I255 Ischemic cardiomyopathy: Secondary | ICD-10-CM

## 2015-06-10 DIAGNOSIS — I480 Paroxysmal atrial fibrillation: Secondary | ICD-10-CM | POA: Diagnosis not present

## 2015-06-10 DIAGNOSIS — R001 Bradycardia, unspecified: Secondary | ICD-10-CM

## 2015-06-10 LAB — CUP PACEART INCLINIC DEVICE CHECK
Battery Voltage: 2.99 V
Brady Statistic AS VP Percent: 0.19 %
Date Time Interrogation Session: 20160805160958
HIGH POWER IMPEDANCE MEASURED VALUE: 46 Ohm
HighPow Impedance: 228 Ohm
HighPow Impedance: 35 Ohm
Lead Channel Impedance Value: 399 Ohm
Lead Channel Impedance Value: 551 Ohm
Lead Channel Pacing Threshold Amplitude: 0.875 V
Lead Channel Pacing Threshold Amplitude: 1 V
Lead Channel Pacing Threshold Pulse Width: 0.4 ms
Lead Channel Pacing Threshold Pulse Width: 0.4 ms
Lead Channel Sensing Intrinsic Amplitude: 1 mV
Lead Channel Sensing Intrinsic Amplitude: 1.625 mV
Lead Channel Setting Pacing Amplitude: 2 V
Lead Channel Setting Pacing Amplitude: 2 V
MDC IDC MSMT BATTERY REMAINING LONGEVITY: 90 mo
MDC IDC MSMT LEADCHNL RA SENSING INTR AMPL: 0.5 mV
MDC IDC MSMT LEADCHNL RV SENSING INTR AMPL: 2 mV
MDC IDC SET LEADCHNL RV PACING PULSEWIDTH: 0.4 ms
MDC IDC SET LEADCHNL RV SENSING SENSITIVITY: 0.3 mV
MDC IDC SET ZONE DETECTION INTERVAL: 300 ms
MDC IDC SET ZONE DETECTION INTERVAL: 430 ms
MDC IDC STAT BRADY AP VP PERCENT: 1 %
MDC IDC STAT BRADY AP VS PERCENT: 98.58 %
MDC IDC STAT BRADY AS VS PERCENT: 0.22 %
MDC IDC STAT BRADY RA PERCENT PACED: 99.59 %
MDC IDC STAT BRADY RV PERCENT PACED: 1.2 %
Zone Setting Detection Interval: 330 ms
Zone Setting Detection Interval: 370 ms
Zone Setting Detection Interval: 540 ms

## 2015-06-10 NOTE — Progress Notes (Signed)
Patient Care Team: [name], MD as PCP - General (Family Medicine) Jolaine Artist, MD as Consulting Physician (Cardiology) Gaye Pollack, MD as Consulting Physician (Cardiothoracic Surgery)   HPI  [name] is a 74 y.o. male Seen in followup for ventricular tachycardia occurring in the context of ischemic heart disease prior revascularization MI. He has had significant problems with ICD discharge and underwent catheter ablation November 2012. He underwent device revision with insertion of an atrial lead and generator replacement 7/14  He underwent repeat ablation August 2014 He was rendered noninducible at this procedure also. He has had recurrent ventricular tachycardia.  Catheterization was recently demonstrated two-vessel coronary disease with patent stents in the chronically totally occluded RCA ejection fraction of 25-30% Myoview scan demonstrated a large inferolateral infarct.  Echocardiogram 8/14 demonstrated EF of 15-20%   He underwent VAD insertion   Over the last few months he has had a series of hospitalizations, the first for GI bleeding requiring transfusion, the second for fractures in his spine, and most recently for recurrent atrial arrhythmia for which he underwent cardioversion.  Past Medical History  Diagnosis Date  . Bradycardia   . Ventricular tachycardia     s/p AICD;   h/o ICD shock;  Amiodarone Rx. S/P ablation in 2012  . 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 HTN (hypertension)   . HLD (hyperlipidemia)   . Cytopenia   . Hypothyroidism   . Renal insufficiency   . Claudication   . Chronic systolic heart failure   . Ischemic cardiomyopathy     echo 7/12:  EF 25-30%, mild AI, mod MR, mod LAE  . TIA (transient ischemic attack) 2001  . CVD (cerebrovascular disease)     left CEA 2008  . Stroke   . Anemia    . Inappropriate therapy from implantable cardioverter-defibrillator 04/02/2013    ATP for sinus tach  SVT wavelet identified SVT but was no  passive   . Myocardial infarction 1992  . Anginal pain   . Automatic implantable cardioverter-defibrillator in situ     Medtronic Evera device serial number [id number]    . Arthritis     "a little in my knees" (05/25/2013)  . Melanoma of ear 2013    "near left ear" (05/25/2013)  . 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 Dysrhythmia     HX A FIB  . History of nonmelanoma skin cancer     L EAR  . Kidney stone   . History of transfusion   . Sleep apnea     denies wearing mask (05/25/2013)  . CAD (coronary artery disease)     s/p MI 1982;  s/p DES to CFX 2011;  s/p NSTEMI 4/12 in setting of VTach;  cath 7/12:  LM ok, LAD mid 30-40%, Dx's with 40%, mCFX stent patent, prox to mid RCA occluded with R to R and L to R collats.  Medical Tx was continued  . CHF (congestive heart failure)     HAS VENTRICULAR PUMP    Past Surgical History  Procedure Laterality Date  . Femoral-popliteal bypass graft Right 2011  . Cardiac defibrillator placement  2003; 2005; 05/25/2013    2014: Medtronic Evera device serial number [id number]  . Revascularization / in-situ  graft leg    . Renal artery bypass Bilateral 2003  . Back surgery    . Lumbar disc surgery  1974; 2000's  . Knee arthroscopy Bilateral 1990's  . Elbow arthroscopy Right 1990's  . Cholecystectomy  12/15/?2010  . Lipoma excision Left 2013    "near ear" (05/25/2013)  . Heel spur surgery Right 1990's  . Cataract extraction w/ intraocular lens  implant, bilateral Bilateral 2000  . Carotid endarterectomy Left ~ 2007  . Doppler echocardiography  2011  . Tonsillectomy  1946  . Femoral-popliteal bypass graft Left 05/2002    Archie Endo 05/06/2002 (05/25/2013)  . Tumor excision Left 1960's    "fatty tumor" (05/25/2013)  . Cardiac electrophysiology study and ablation  2012  . Coronary angioplasty  1992  . Cardiac  catheterization      "several" (05/25/2013)  . Coronary angioplasty with stent placement      "last one was 11/2012  (05/25/2013)  . Cardioversion N/A 10/15/2013    Procedure: CARDIOVERSION;  Surgeon: Jolaine Artist, MD;  Location: [place];  Service: Cardiovascular;  Laterality: N/A;  . Insertion of implantable left ventricular assist device N/A 10/19/2013    Procedure: INSERTION OF IMPLANTABLE LEFT VENTRICULAR ASSIST DEVICE;  Surgeon: Gaye Pollack, MD;  Location: [place];  Service: Open Heart Surgery;  Laterality: N/A;  CIRC ARREST  NITRIC OXIDE  MEDTRONIC ICD  . Intraoperative transesophageal echocardiogram N/A 10/19/2013    Procedure: INTRAOPERATIVE TRANSESOPHAGEAL ECHOCARDIOGRAM;  Surgeon: Gaye Pollack, MD;  Location: [facility] OR;  Service: Open Heart Surgery;  Laterality: N/A;  . Tee without cardioversion N/A 11/04/2013    Procedure: TRANSESOPHAGEAL ECHOCARDIOGRAM (TEE);  Surgeon: Larey Dresser, MD;  Location: [place];  Service: Cardiovascular;  Laterality: N/A;  . Cardioversion N/A 11/04/2013    Procedure: CARDIOVERSION;  Surgeon: Larey Dresser, MD;  Location: [place];  Service: Cardiovascular;  Laterality: N/A;  . Cardioversion N/A 06/04/2014    Procedure: CARDIOVERSION;  Surgeon: Jolaine Artist, MD;  Location: [facility] ENDOSCOPY;  Service: Cardiovascular;  Laterality: N/A;  . Cystoscopy with retrograde pyelogram, ureteroscopy and stent placement Left 08/09/2014    Procedure: [place], URETEROSCOPY AND STENT PLACEMENT;  Surgeon: Bernestine Amass, MD;  Location: [facility];  Service: Urology;  Laterality: Left;  . Cystoscopy with retrograde pyelogram, ureteroscopy and stent placement Left 09/01/2014    Procedure: CYSTOSCOPY WITH URETEROSCOPY AND STENT REMOVAL;  Surgeon: Bernestine Amass, MD;  Location: [facility];  Service: Urology;  Laterality: Left;  . Holmium laser application Left 56/21/3086    Procedure: HOLMIUM LASER APPLICATION;  Surgeon: Bernestine Amass, MD;   Location: [facility];  Service: Urology;  Laterality: Left;  . Cystoscopy with retrograde pyelogram, ureteroscopy and stent placement Left 09/20/2014    Procedure: CYSTOSCOPY WITH RETROGRADE PYELOGRAM, URETEROSCOPY AND STENT PLACEMENT, stone removal;  Surgeon: Bernestine Amass, MD;  Location: [facility];  Service: Urology;  Laterality: Left;  Stephanie Coup ablation N/A 09/12/2011    Procedure: V-TACH ABLATION;  Surgeon: Evans Lance, MD;  Location: [facility] CATH LAB;  Service: Cardiovascular;  Laterality: N/A;  . Left heart catheterization with coronary angiogram N/A 11/24/2012    Procedure: LEFT HEART CATHETERIZATION WITH CORONARY ANGIOGRAM;  Surgeon: Peter M Martinique, MD;  Location: [facility] CATH LAB;  Service: Cardiovascular;  Laterality: N/A;  . Implantable cardioverter defibrillator generator change N/A 05/25/2013    Procedure: IMPLANTABLE CARDIOVERTER DEFIBRILLATOR GENERATOR CHANGE;  Surgeon: Deboraha Sprang, MD;  Location: [facility] CATH LAB;  Service: Cardiovascular;  Laterality: N/A;  . Lead revision N/A 06/05/2013    Procedure: LEAD REVISION;  Surgeon: Thompson Grayer, MD;  Location: [facility] CATH LAB;  Service: Cardiovascular;  Laterality: N/A;  . V-tach ablation N/A 06/08/2013    Procedure: V-TACH ABLATION;  Surgeon: Evans Lance, MD;  Location: [facility] CATH LAB;  Service: Cardiovascular;  Laterality: N/A;  . Right heart catheterization N/A 09/17/2013    Procedure: RIGHT HEART CATH;  Surgeon: Jolaine Artist, MD;  Location: [facility] CATH LAB;  Service: Cardiovascular;  Laterality: N/A;  . Right heart catheterization N/A 10/14/2013    Procedure: RIGHT HEART CATH;  Surgeon: Jolaine Artist, MD;  Location: [facility] CATH LAB;  Service: Cardiovascular;  Laterality: N/A;  . Right heart catheterization N/A 11/16/2013    Procedure: RIGHT HEART CATH;  Surgeon: Jolaine Artist, MD;  Location: [facility] CATH LAB;  Service: Cardiovascular;  Laterality: N/A;  . Right heart catheterization N/A 07/13/2014    Procedure: RIGHT HEART CATH;  Surgeon: Jolaine Artist, MD;  Location: [facility] CATH LAB;  Service: Cardiovascular;  Laterality: N/A;  . Esophagogastroduodenoscopy N/A 02/15/2015    Procedure: ESOPHAGOGASTRODUODENOSCOPY (EGD);  Surgeon: Carol Ada, MD;  Location: [facility] ENDOSCOPY;  Service: Endoscopy;  Laterality: N/A;  LVAD  . Tee without cardioversion N/A 03/02/2015    Procedure: TRANSESOPHAGEAL ECHOCARDIOGRAM (TEE);  Surgeon: Jolaine Artist, MD;  Location: [facility] ENDOSCOPY;  Service: Cardiovascular;  Laterality: N/A;  . Cardioversion N/A 03/02/2015    Procedure: CARDIOVERSION;  Surgeon: Jolaine Artist, MD;  Location: [facility] ENDOSCOPY;  Service: Cardiovascular;  Laterality: N/A;    Current Outpatient Prescriptions  Medication Sig Dispense Refill  . acetaminophen (TYLENOL) 325 MG tablet Take 650 mg by mouth every 6 (six) hours as needed (pain).    [name] amiodarone (PACERONE) 200 MG tablet Take 1 tablet (200 mg total) by mouth daily. 30 tablet 6  . amLODipine (NORVASC) 10 MG tablet Take 1 tablet (10 mg total) by mouth daily. 30 tablet 6  . atorvastatin (LIPITOR) 80 MG tablet Take 80 mg by mouth daily.     [name] docusate sodium (COLACE) 100 MG capsule Take 200 mg by mouth daily. Taking every other day    . doxycycline (VIBRAMYCIN) 100 MG capsule Take 1 capsule (100 mg total) by mouth 2 (two) times daily. Finish this medication unless otherwise instructed. 14 capsule 0  . ferrous gluconate (FERGON) 324 MG tablet Take 1 tablet (324 mg total) by mouth daily with breakfast. 30 tablet 6  . furosemide (LASIX) 20 MG tablet Take 20 mg by mouth daily as needed for fluid.     [name] gabapentin (NEURONTIN) 300 MG capsule Take 300 mg by mouth 3 (three) times daily.    [name] levothyroxine (SYNTHROID, LEVOTHROID) 200 MCG tablet Take 200 mcg daily before breakfast 30 tablet 6  . magnesium gluconate (MAGONATE) 500 MG tablet Take 1,000 mg by mouth at bedtime.     . Multiple Vitamin (MULTIVITAMIN WITH MINERALS) TABS tablet Take 1 tablet by mouth at bedtime.    . nitroGLYCERIN  (NITROSTAT) 0.4 MG SL tablet Place 1 tablet (0.4 mg total) under the tongue every 5 (five) minutes as needed for chest pain. 30 tablet 6  . pantoprazole (PROTONIX) 40 MG tablet Take 1 tablet (40 mg total) by mouth 2 (two) times daily. 60 tablet 6  . promethazine (PHENERGAN) 12.5 MG tablet Take 1 tablet (12.5 mg total) by mouth every 6 (six) hours as needed for nausea or vomiting. 30 tablet 0  . RANEXA 500 MG  12 hr tablet TAKE 1 TABLET (500 MG TOTAL) BY MOUTH 2 (TWO) TIMES DAILY. 60 tablet 6  . warfarin (COUMADIN) 2 MG tablet Take 2mg  daily, except takes 4mg  on Monday and Friday. (Patient taking differently: Take 2 mg by mouth daily. ) 50 tablet 6  . zinc gluconate 50 MG tablet Take 100 mg by mouth at bedtime.      No current facility-administered medications for this visit.    Allergies  Allergen Reactions  . Demerol [Meperidine] Other (See Comments)    Paralysis. Could only move eyes.    Review of Systems negative except from HPI and PMH  Physical Exam Ht 6' (1.829 m)  Wt 205 lb (92.987 kg)  BMI 27.80 kg/m2 Well developed and well nourished in no acute distress HENT normal E scleral and icterus clear Neck Supple JVP8-10 ; carotids brisk and full Clear to ausculation  Regular rate and rhythm, LVAD whirring  Soft with active bowel sounds No clubbing cyanosis  1-2+Edema Alert and oriented, grossly normal motor and sensory function Skin Warm and Dry    Assessment and  Plan  CHF -acute chronic systolic   ICD-Medtronic The patient's device was interrogated.  The information was reviewed. No changes were made in the programming.   '  LVAD  ISCHEMIC CARDIOMYOPATHY  TIA/CVA  VT  AFib  Loss of R waves-ICD  Treated hypothyroidism   He continues to do well. His R waves are diminished but stable. He is holding sinus rhythm. There is evidence of volume overload. I will have him increase his diuretics for the next 3 days.  His last TSH was markedly elevated. Synthroid was  increased. His TSH remains increased. I was in touch with Dr. Forde Dandy. His T4 is elevated T3 is low. We will increase his Synthroid 200--224 mics.

## 2015-06-10 NOTE — Patient Instructions (Signed)
Medication Instructions:  Your physician recommends that you continue on your current medications as directed. Please refer to the Current Medication list given to you today.  Labwork: None ordered  Testing/Procedures: None ordered  Follow-Up: Remote monitoring is used to monitor your Pacemaker of ICD from home. This monitoring reduces the number of office visits required to check your device to one time per year. It allows [place] to keep an eye on the functioning of your device to ensure it is working properly. You are scheduled for a device check from home on 09/12/15. You may send your transmission at any time that day. If you have a wireless device, the transmission will be sent automatically. After your physician reviews your transmission, you will receive a postcard with your next transmission date.  Your physician wants you to follow-up in: 1 year with Dr. Caryl Comes.  You will receive a reminder letter in the mail two months in advance. If you don't receive a letter, please call our office to schedule the follow-up appointment.   Any Other Special Instructions Will Be Listed Below (If Applicable). Thank you for choosing [place]!!

## 2015-06-13 ENCOUNTER — Ambulatory Visit (HOSPITAL_COMMUNITY)
Admission: RE | Admit: 2015-06-13 | Discharge: 2015-06-13 | Disposition: A | Discharge status: Home / Self Care | Payer: Medicare Other | Source: Ambulatory Visit | Attending: Internal Medicine | Admitting: Internal Medicine

## 2015-06-13 ENCOUNTER — Other Ambulatory Visit: Payer: Self-pay | Admitting: Infectious Diseases

## 2015-06-13 ENCOUNTER — Telehealth: Payer: Self-pay | Admitting: Infectious Diseases

## 2015-06-13 DIAGNOSIS — Z95811 Presence of heart assist device: Secondary | ICD-10-CM

## 2015-06-13 DIAGNOSIS — I517 Cardiomegaly: Secondary | ICD-10-CM | POA: Diagnosis not present

## 2015-06-13 DIAGNOSIS — I5022 Chronic systolic (congestive) heart failure: Secondary | ICD-10-CM | POA: Diagnosis not present

## 2015-06-13 DIAGNOSIS — Z95810 Presence of automatic (implantable) cardiac defibrillator: Secondary | ICD-10-CM | POA: Diagnosis not present

## 2015-06-13 DIAGNOSIS — W19XXXA Unspecified fall, initial encounter: Secondary | ICD-10-CM | POA: Diagnosis not present

## 2015-06-13 DIAGNOSIS — J90 Pleural effusion, not elsewhere classified: Secondary | ICD-10-CM | POA: Diagnosis not present

## 2015-06-13 DIAGNOSIS — Z7901 Long term (current) use of anticoagulants: Secondary | ICD-10-CM

## 2015-06-13 LAB — CBC
HCT: 35.4 % — ABNORMAL LOW (ref 39.0–52.0)
HEMOGLOBIN: 11.6 g/dL — AB (ref 13.0–17.0)
MCH: 29.7 pg (ref 26.0–34.0)
MCHC: 32.8 g/dL (ref 30.0–36.0)
MCV: 90.5 fL (ref 78.0–100.0)
Platelets: 187 10*3/uL (ref 150–400)
RBC: 3.91 MIL/uL — AB (ref 4.22–5.81)
RDW: 18.8 % — AB (ref 11.5–15.5)
WBC: 7.1 10*3/uL (ref 4.0–10.5)

## 2015-06-13 LAB — BASIC METABOLIC PANEL
Anion gap: 9 (ref 5–15)
BUN: 17 mg/dL (ref 6–20)
CHLORIDE: 106 mmol/L (ref 101–111)
CO2: 23 mmol/L (ref 22–32)
CREATININE: 1.33 mg/dL — AB (ref 0.61–1.24)
Calcium: 8.9 mg/dL (ref 8.9–10.3)
GFR calc Af Amer: 59 mL/min — ABNORMAL LOW (ref >60)
GFR, EST NON AFRICAN AMERICAN: 51 mL/min — AB (ref >60)
Glucose, Bld: 98 mg/dL (ref 65–99)
Potassium: 4.4 mmol/L (ref 3.5–5.1)
SODIUM: 138 mmol/L (ref 135–145)

## 2015-06-13 NOTE — Telephone Encounter (Signed)
Patient received Head CT (neg), INR and X-Ray of right pinky finger at [facility]. D/W Dr. Haroldine Laws and advised patient to come to clinic to evaluate equipment specially since he received scans.

## 2015-06-13 NOTE — Patient Instructions (Addendum)
1. You are going to be very sore the next few days after your fall. Ice applied to bruised areas, rest and elevation are best over the next 48 hours.   2. Take your tylenol as needed for pain, or if you need the Vicodin, be aware that contains tylenol also. Maximum daily dose is 4000 mg in 24 hour period.   3. No changes in your Coumadin with INR of 2.4 Recheck next week with home machine.   4. Call us back if you need to come into the hospital for a day or so for pain management   5. Apply Mupiricin to wounds as previously directed by [facility]   6. Dr. Olin Pia office should call you to clarify the synthroid dose. If not call his office.   7. If you should experience any alarms with equipment please call.

## 2015-06-13 NOTE — Telephone Encounter (Signed)
Page received from [place]. [name] fell at work this morning and was taken to [facility] ED. She does not know any details regarding fall at this time, but will keep [place] updated. She requested a clinic work in appointment, however advised to bring [name] to [facility] ED for evaluation since he had an acute event with possible injury. Verbalized understanding. Unsure of exact ETA at this point as she is still waiting to talk with someone in ED at [facility]. Dr. Haroldine [name] updated and [facility] ED Charge RN [name] updated with impending arrival. Requested to page VAD pager once they arrive and will bring VAD equipment to ED.

## 2015-06-13 NOTE — Progress Notes (Signed)
Acute Visit: Patient sustained fall down set of stairs at work this morning. Went up 15 steps d/t elevators being out and legs gave out at the top of the steps. Fell down 13 steps. Denies any loss of consciousness. Walked away from fall and was taken to [facility] ER due to lacerations. Head CT negative at [facility]; INR 2.4 and x-ray of right pinky finger done showing dislocation (this was fixed in the ED).   Concerned for VAD equipment function since fall.   Vital Signs: HR: 68 Doppler MAP: 88  LVAD Interrogation: Flow: 4.1 Speed: 9200 PI: 7.1 Power: 4.8  Plan:  D/W Dr. Lucrezia Starch obtain 2 view CXR to evaluate equipment. They deny hearing any alarms since fall and DL completely intact and without tearing. PC appears to be functioning as expected and does not have any visual damage to it.   He has several lacerations and tears to skin on bilateral hands, posterior forearms and elbows. These wounds were cleansed and redressed with Xeroform dressings, gauze and kerlix. Instructed to apply mupiricin with non-adherent pads twice a day to keep dressings from drying out. Instructed that to avoid further damage to skin can use sterile saline (patient's SO reports they have some at home) to irrigate dressings.   He has significant bruising to R ear, nasal bridge and right lateral leg (above ankle and on thigh above knee). He is able to support weight at this time and can walk (although he was brought to [place] in a wheelchair and is weak/sore).   Will maintain VAD appointment 06/23/15 and encouraged to F/U with the team as needed until then.   Janene Madeira, RN VAD Coordinator   Office: [phone] 24/7 VAD Pager: [phone]

## 2015-06-14 ENCOUNTER — Encounter: Payer: Self-pay | Admitting: Internal Medicine

## 2015-06-14 DIAGNOSIS — W19XXXA Unspecified fall, initial encounter: Secondary | ICD-10-CM | POA: Insufficient documentation

## 2015-06-14 NOTE — Progress Notes (Signed)
Patient ID: [name], male   DOB: 1941/01/14, 74 y.o.   MRN: [id number] PCP: Dr. Karolee Stamps ([place])  [name] is a 74 yo male with h/o VT, PAD and severe systolic HF (EF 67-20%) due to mixed ischemic/nonischemic CM he is s/p HM II LVAD implant for destination therapy on October 19, 2013. On 09/01/14 underwent cystoscopy and flexible ureteroscopy with lithotripsy and basket extraction.   VAD implantation was complicated by VT, syncope, A fib/Aflutter and persistent low output felt to be due to RHF. RHC showed low cardiac output but no evidence of RV failure on Milrinone. He was felt to be volume depleted. He was discharged home on 11/18/13 on mirlinone and PRN lasix 20 mg for a weight of 200 pounds or greater. Milrinone has since been weaned off.   RHC (07/13/14): Well compensated hemodynamics with VAD support.  Admitted 11/9-11/19/15 for hematuria and chronic nephrolithiasis. Underwent Echo to assess RV for concern RV failure because he presented with SOB. RV looked good and had renal US showed mild hyronephrosis and bilateral renal cysts. Dr. Risa Grill took back for repeat lithotripsy and stone extraction by ureteroscopy and J stent placement. Received total of 3 PRBCs. D/C weight 188 lbs.   Over past month has had 3 admits. In 4/16 initially admitted with weakness and fall. In ER hemoglobin was found to be 5.4 with MAP 78.  While hospitalized he received 5UPRBCs. 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 Then readmitted with acute L4 compression fracture. Most recently admitted with weakness and low PI. 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 Went up 15 steps d/t elevators being out and legs gave out at the top of the steps. Fell backward down 13 steps. Denies any loss of consciousness. Walked away  from fall and was taken to [facility] ER due to lacerations. Head CT negative at [facility]; INR 2.4 and x-ray of right pinky finger done showing dislocation which was reduced in ER. Presents here for f/u and to check VAD equipment. Denies palpitations or syncope. Says legs just gave out.    Denies LVAD alarms.  Denies driveline trauma, erythema or drainage.  Denies ICD shocks. Reports taking Coumadin as prescribed and adherence to anticoagulation based dietary restrictions.  Denies bright red blood per rectum or melena, no dark urine or hematuria.      Past Medical History  Diagnosis Date  . Bradycardia   . Ventricular tachycardia     s/p AICD;   h/o ICD shock;  Amiodarone Rx. S/P ablation in 2012  . 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 HTN (hypertension)   . HLD (hyperlipidemia)   . Cytopenia   . Hypothyroidism   . Renal insufficiency   . Claudication   . Chronic systolic heart failure   . Ischemic cardiomyopathy     echo 7/12:  EF 25-30%, mild AI, mod MR, mod LAE  . TIA (transient ischemic attack) 2001  . CVD (cerebrovascular disease)     left CEA 2008  . Stroke   . Anemia   . Inappropriate therapy from implantable cardioverter-defibrillator 04/02/2013  ATP for sinus tach  SVT wavelet identified SVT but was no  passive   . Myocardial infarction 1992  . Anginal pain   . Automatic implantable cardioverter-defibrillator in situ     Medtronic Evera device serial number [id number]    . Arthritis     "a little in my knees" (05/25/2013)  . Melanoma of ear 2013    "near left ear" (05/25/2013)  . 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 Dysrhythmia     HX A FIB  . History of nonmelanoma skin cancer     L EAR  . Kidney stone   . History of transfusion   . Sleep apnea     denies wearing mask (05/25/2013)  . CAD (coronary artery disease)      s/p MI 1982;  s/p DES to CFX 2011;  s/p NSTEMI 4/12 in setting of VTach;  cath 7/12:  LM ok, LAD mid 30-40%, Dx's with 40%, mCFX stent patent, prox to mid RCA occluded with R to R and L to R collats.  Medical Tx was continued  . CHF (congestive heart failure)     HAS VENTRICULAR PUMP    Current Outpatient Prescriptions  Medication Sig Dispense Refill  . acetaminophen (TYLENOL) 325 MG tablet Take 650 mg by mouth every 6 (six) hours as needed (pain).    [name] amiodarone (PACERONE) 200 MG tablet Take 1 tablet (200 mg total) by mouth daily. 30 tablet 6  . amLODipine (NORVASC) 10 MG tablet Take 1 tablet (10 mg total) by mouth daily. 30 tablet 6  . atorvastatin (LIPITOR) 80 MG tablet Take 80 mg by mouth daily.     [name] docusate sodium (COLACE) 100 MG capsule Take 200 mg by mouth daily. Taking every other day    . doxycycline (VIBRAMYCIN) 100 MG capsule Take 1 capsule (100 mg total) by mouth 2 (two) times daily. Finish this medication unless otherwise instructed. 14 capsule 0  . ferrous gluconate (FERGON) 324 MG tablet Take 1 tablet (324 mg total) by mouth daily with breakfast. 30 tablet 6  . furosemide (LASIX) 20 MG tablet Take 20 mg by mouth daily as needed for fluid.     [name] gabapentin (NEURONTIN) 300 MG capsule Take 300 mg by mouth 2 (two) times daily.     [name] levothyroxine (SYNTHROID, LEVOTHROID) 200 MCG tablet Take 200 mcg daily before breakfast 30 tablet 6  . magnesium gluconate (MAGONATE) 500 MG tablet Take 1,000 mg by mouth at bedtime.     . Multiple Vitamin (MULTIVITAMIN WITH MINERALS) TABS tablet Take 1 tablet by mouth at bedtime.    . nitroGLYCERIN (NITROSTAT) 0.4 MG SL tablet Place 1 tablet (0.4 mg total) under the tongue every 5 (five) minutes as needed for chest pain. 30 tablet 6  . pantoprazole (PROTONIX) 40 MG tablet Take 1 tablet (40 mg total) by mouth 2 (two) times daily. 60 tablet 6  . promethazine (PHENERGAN) 12.5 MG tablet Take 1 tablet (12.5 mg total) by mouth every 6 (six) hours as needed  for nausea or vomiting. 30 tablet 0  . RANEXA 500 MG 12 hr tablet TAKE 1 TABLET (500 MG TOTAL) BY MOUTH 2 (TWO) TIMES DAILY. 60 tablet 6  . warfarin (COUMADIN) 2 MG tablet Take 2mg  daily, except takes 4mg  on Monday and Friday. (Patient taking differently: Take 2 mg by mouth daily. ) 50 tablet 6  . zinc gluconate 50 MG tablet Take 100 mg by mouth at bedtime.  No current facility-administered medications for this encounter.    Demerol  REVIEW OF SYSTEMS: All systems negative except as listed in HPI, PMH and Problem list.  LVAD INTERROGATION:   HeartMate II LVAD:   LVAD Interrogation: Flow: 4.1 Speed: 9200 PI: 7.1 Power: 4.8 Occasionla PI events Primary Controller: Replace back up battery  Back up controller: Replace back up battery  I reviewed the LVAD parameters from today, and compared the results to the patient's prior recorded data.  Increased set speed to 9200 and back-up speed to 8600. The LVAD is functioning within specified parameters.  The patient performs LVAD self-test daily.  LVAD interrogation was negative for any significant power changes, alarms or PI events/speed drops.  LVAD equipment check completed and is in good working order.  Back-up equipment present.   LVAD education done on emergency procedures and precautions and reviewed exit site care.    MAP 88 Physical Exam: GENERAL: NAD multiple injuries which are wrapped HEENT: swelling and laceration to R ear and bridge of nose NECK: Supple, JVP flat  2+ bilaterally, no bruits.  No lymphadenopathy or thyromegaly appreciated.   CARDIAC:  Mechanical heart sounds with LVAD hum present.  LUNGS:  clear.  ABDOMEN:  Soft, round, nontender, positive bowel sounds x4.     LVAD exit site: well-healed and incorporated.  Dressing dry and intact.  No erythema or drainage.  Stabilization device present and accurately applied.  Driveline dressing is being changed weekly per sterile technique. EXTREMITIES:  Warm and dry, no  cyanosis, clubbing, multiple skin tears and abrasions. R little finger in splint. R calf hematoma no edema on left NEUROLOGIC:  Alert and oriented x 4.  Gait steady.  No aphasia.  No dysarthria.  Affect pleasant.      ASSESSMENT AND PLAN:  1. Mechanical fall with multiple injuries  - Head CT negative for bleed - VAD working properly. Will get CXR to assess VAD positioning. - Offered overnight admission for observation and pain control but he refused  2. Chronic systolic HF: s/p LVAD 12/6376 - He continues to grow weaker - NYHA III symptoms. Volume status stable. Continue  20 mg lasix as needed.    - He is not on a BB with history of RV failure.  - MAP ok - No ACE-I d/t increased his renal function in the past.  3. Afib/Flutter - s/p DC-CV 06/04/14. With repeat DC-CV 03/01/25.  Cut back amio to 200 daily due to nausea/gagging. Continue adjunctive Ranexa. 4 HTN -MAP ok today.Continue current regimen 5. LVAD - LVAD parameters reviewed personally - stable.  - Multiple PI events - but stable - Check CXR due to trauma 6.CKD, stage III -BMET ok   7. Anticoagulation management: Goal INR 2-2.5  - INR ok 8. Hematuria - Resolved and stent has been removed.  9. S/P Renal Stone lithotripsy- Per Dr Risa Grill. 10. h/oGIB  - s/p clipping of 2 jejunal AVMs in 4/16. HGB stable.  11. L4 compression fracture  - Improved  12.  Hypothyroid- getting f/u with Dr. Dione Booze, Frank Quince  MD 12:22 AM

## 2015-06-16 ENCOUNTER — Ambulatory Visit (HOSPITAL_COMMUNITY): Payer: Medicare Other | Admitting: *Deleted

## 2015-06-16 LAB — POCT INR: INR: 2

## 2015-06-20 ENCOUNTER — Ambulatory Visit (HOSPITAL_COMMUNITY): Payer: Medicare Other | Admitting: *Deleted

## 2015-06-20 LAB — POCT INR: INR: 1.8

## 2015-06-23 ENCOUNTER — Other Ambulatory Visit (HOSPITAL_COMMUNITY): Payer: Self-pay | Admitting: *Deleted

## 2015-06-23 ENCOUNTER — Ambulatory Visit (HOSPITAL_COMMUNITY)
Admission: RE | Admit: 2015-06-23 | Discharge: 2015-06-23 | Disposition: A | Discharge status: Home / Self Care | Payer: Medicare Other | Source: Ambulatory Visit | Attending: Cardiology | Admitting: Cardiology

## 2015-06-23 ENCOUNTER — Ambulatory Visit (HOSPITAL_COMMUNITY): Payer: Self-pay | Admitting: *Deleted

## 2015-06-23 VITALS — BP 93/64 | HR 71 | Ht 72.0 in | Wt 205.2 lb

## 2015-06-23 DIAGNOSIS — I4892 Unspecified atrial flutter: Secondary | ICD-10-CM | POA: Diagnosis not present

## 2015-06-23 DIAGNOSIS — I129 Hypertensive chronic kidney disease with stage 1 through stage 4 chronic kidney disease, or unspecified chronic kidney disease: Secondary | ICD-10-CM | POA: Insufficient documentation

## 2015-06-23 DIAGNOSIS — Z9181 History of falling: Secondary | ICD-10-CM | POA: Insufficient documentation

## 2015-06-23 DIAGNOSIS — R0600 Dyspnea, unspecified: Secondary | ICD-10-CM | POA: Diagnosis not present

## 2015-06-23 DIAGNOSIS — N183 Chronic kidney disease, stage 3 unspecified: Secondary | ICD-10-CM

## 2015-06-23 DIAGNOSIS — Z8673 Personal history of transient ischemic attack (TIA), and cerebral infarction without residual deficits: Secondary | ICD-10-CM | POA: Diagnosis not present

## 2015-06-23 DIAGNOSIS — Z7901 Long term (current) use of anticoagulants: Secondary | ICD-10-CM

## 2015-06-23 DIAGNOSIS — Z79899 Other long term (current) drug therapy: Secondary | ICD-10-CM | POA: Diagnosis not present

## 2015-06-23 DIAGNOSIS — Z95811 Presence of heart assist device: Secondary | ICD-10-CM | POA: Diagnosis not present

## 2015-06-23 DIAGNOSIS — E039 Hypothyroidism, unspecified: Secondary | ICD-10-CM | POA: Diagnosis not present

## 2015-06-23 DIAGNOSIS — E785 Hyperlipidemia, unspecified: Secondary | ICD-10-CM | POA: Insufficient documentation

## 2015-06-23 DIAGNOSIS — I4891 Unspecified atrial fibrillation: Secondary | ICD-10-CM | POA: Insufficient documentation

## 2015-06-23 DIAGNOSIS — D509 Iron deficiency anemia, unspecified: Secondary | ICD-10-CM | POA: Diagnosis not present

## 2015-06-23 DIAGNOSIS — M4856XA Collapsed vertebra, not elsewhere classified, lumbar region, initial encounter for fracture: Secondary | ICD-10-CM | POA: Diagnosis not present

## 2015-06-23 DIAGNOSIS — I252 Old myocardial infarction: Secondary | ICD-10-CM | POA: Diagnosis not present

## 2015-06-23 DIAGNOSIS — G4730 Sleep apnea, unspecified: Secondary | ICD-10-CM | POA: Insufficient documentation

## 2015-06-23 DIAGNOSIS — I2510 Atherosclerotic heart disease of native coronary artery without angina pectoris: Secondary | ICD-10-CM | POA: Diagnosis not present

## 2015-06-23 DIAGNOSIS — I5022 Chronic systolic (congestive) heart failure: Secondary | ICD-10-CM | POA: Diagnosis present

## 2015-06-23 DIAGNOSIS — M545 Low back pain, unspecified: Secondary | ICD-10-CM

## 2015-06-23 DIAGNOSIS — I480 Paroxysmal atrial fibrillation: Secondary | ICD-10-CM

## 2015-06-23 LAB — CBC
HCT: 30.7 % — ABNORMAL LOW (ref 39.0–52.0)
Hemoglobin: 9.9 g/dL — ABNORMAL LOW (ref 13.0–17.0)
MCH: 30.5 pg (ref 26.0–34.0)
MCHC: 32.2 g/dL (ref 30.0–36.0)
MCV: 94.5 fL (ref 78.0–100.0)
Platelets: 197 K/uL (ref 150–400)
RBC: 3.25 MIL/uL — ABNORMAL LOW (ref 4.22–5.81)
RDW: 19.4 % — ABNORMAL HIGH (ref 11.5–15.5)
WBC: 5.6 K/uL (ref 4.0–10.5)

## 2015-06-23 LAB — BASIC METABOLIC PANEL WITH GFR
Anion gap: 8 (ref 5–15)
BUN: 17 mg/dL (ref 6–20)
CO2: 24 mmol/L (ref 22–32)
Calcium: 8.8 mg/dL — ABNORMAL LOW (ref 8.9–10.3)
Chloride: 106 mmol/L (ref 101–111)
Creatinine, Ser: 1.42 mg/dL — ABNORMAL HIGH (ref 0.61–1.24)
GFR calc Af Amer: 54 mL/min — ABNORMAL LOW
GFR calc non Af Amer: 47 mL/min — ABNORMAL LOW
Glucose, Bld: 146 mg/dL — ABNORMAL HIGH (ref 65–99)
Potassium: 3.9 mmol/L (ref 3.5–5.1)
Sodium: 138 mmol/L (ref 135–145)

## 2015-06-23 LAB — LACTATE DEHYDROGENASE: LDH: 268 U/L — ABNORMAL HIGH (ref 98–192)

## 2015-06-23 LAB — BRAIN NATRIURETIC PEPTIDE: B Natriuretic Peptide: 585.4 pg/mL — ABNORMAL HIGH (ref 0.0–100.0)

## 2015-06-23 LAB — PROTIME-INR
INR: 2.1 — ABNORMAL HIGH (ref 0.00–1.49)
Prothrombin Time: 23.4 seconds — ABNORMAL HIGH (ref 11.6–15.2)

## 2015-06-23 MED ORDER — FERROUS GLUCONATE 324 (38 FE) MG PO TABS: 324.0000 mg | ORAL_TABLET | Freq: Two times a day (BID) | ORAL | Status: DC

## 2015-06-23 NOTE — Progress Notes (Signed)
Patient ID: [name], male   DOB: 10-26-1941, 74 y.o.   MRN: [id number] PCP: Dr. Karolee Stamps ([place])  [name] is a 74 yo male with h/o VT, PAD and severe systolic HF (EF 48-54%) due to mixed ischemic/nonischemic CM he is s/p HM II LVAD implant for destination therapy on October 19, 2013. On 09/01/14 underwent cystoscopy and flexible ureteroscopy with lithotripsy and basket extraction.   VAD implantation was complicated by VT, syncope, A fib/Aflutter and persistent low output felt to be due to RHF. RHC showed low cardiac output but no evidence of RV failure on Milrinone. He was felt to be volume depleted. He was discharged home on 11/18/13 on mirlinone and PRN lasix 20 mg for a weight of 200 pounds or greater. Milrinone has since been weaned off.   RHC (07/13/14): Well compensated hemodynamics with VAD support.  Admitted 11/9-11/19/15 for hematuria and chronic nephrolithiasis. Underwent Echo to assess RV for concern RV failure because he presented with SOB. RV looked good and had renal US showed mild hyronephrosis and bilateral renal cysts. Dr. Risa [name] took back for repeat lithotripsy and stone extraction by ureteroscopy and J stent placement. Received total of 3 PRBCs. D/C weight 188 lbs.   Over past month has had 3 admits. In 4/16 initially admitted with weakness and fall. In ER hemoglobin was found to be 5.4 with MAP 78.  While hospitalized he received 5UPRBCs. 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 Then readmitted with acute L4 compression fracture. Most recently admitted with weakness and low PI. 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 Denies SOB/PND/Orthopnea. Gagging episodes resolved with gabpentin. Saw PCP on 8/12 for RLE hematoma. Edema and ecchymosis improving. Taking all medications. Weight at home 205 pounds. Appetite ok. Currently not working after fall. No  BRBPR.   Denies LVAD alarms.  Denies driveline trauma, erythema or drainage.  Denies ICD shocks. Reports taking Coumadin as prescribed and adherence to anticoagulation based dietary restrictions.  Denies bright red blood per rectum or melena, no dark urine or hematuria.      Past Medical History  Diagnosis Date  . Bradycardia   . Ventricular tachycardia     s/p AICD;   h/o ICD shock;  Amiodarone Rx. S/P ablation in 2012  . 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 HTN (hypertension)   . HLD (hyperlipidemia)   . Cytopenia   . Hypothyroidism   . Renal insufficiency   . Claudication   . Chronic systolic heart failure   . Ischemic cardiomyopathy     echo 7/12:  EF 25-30%, mild AI, mod MR, mod LAE  . TIA (transient ischemic attack) 2001  . CVD (cerebrovascular disease)     left CEA 2008  . Stroke   . Anemia   . Inappropriate therapy from implantable cardioverter-defibrillator 04/02/2013    ATP for sinus tach  SVT wavelet identified SVT but was no  passive   . Myocardial infarction 1992  . Anginal pain   . Automatic implantable cardioverter-defibrillator in situ     Medtronic Evera device serial number [id number]    . Arthritis     "a little in my knees" (05/25/2013)  .  Melanoma of ear 2013    "near left ear" (05/25/2013)  . 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 Dysrhythmia     HX A FIB  . History of nonmelanoma skin cancer     L EAR  . Kidney stone   . History of transfusion   . Sleep apnea     denies wearing mask (05/25/2013)  . CAD (coronary artery disease)     s/p MI 1982;  s/p DES to CFX 2011;  s/p NSTEMI 4/12 in setting of VTach;  cath 7/12:  LM ok, LAD mid 30-40%, Dx's with 40%, mCFX stent patent, prox to mid RCA occluded with R to R and L to R collats.  Medical Tx was continued  . CHF (congestive heart failure)     HAS VENTRICULAR PUMP     Current Outpatient Prescriptions  Medication Sig Dispense Refill  . amiodarone (PACERONE) 200 MG tablet Take 1 tablet (200 mg total) by mouth daily. 30 tablet 6  . amLODipine (NORVASC) 10 MG tablet Take 1 tablet (10 mg total) by mouth daily. 30 tablet 6  . atorvastatin (LIPITOR) 80 MG tablet Take 80 mg by mouth daily.     [name] docusate sodium (COLACE) 100 MG capsule Take 200 mg by mouth daily. Taking every other day    . ferrous gluconate (FERGON) 324 MG tablet Take 1 tablet (324 mg total) by mouth daily with breakfast. 30 tablet 6  . gabapentin (NEURONTIN) 300 MG capsule Take 300 mg by mouth 2 (two) times daily.     [name] levothyroxine (SYNTHROID, LEVOTHROID) 200 MCG tablet Take 200 mcg daily before breakfast 30 tablet 6  . magnesium gluconate (MAGONATE) 500 MG tablet Take 1,000 mg by mouth at bedtime.     . Multiple Vitamin (MULTIVITAMIN WITH MINERALS) TABS tablet Take 1 tablet by mouth at bedtime.    . pantoprazole (PROTONIX) 40 MG tablet Take 1 tablet (40 mg total) by mouth 2 (two) times daily. 60 tablet 6  . RANEXA 500 MG 12 hr tablet TAKE 1 TABLET (500 MG TOTAL) BY MOUTH 2 (TWO) TIMES DAILY. 60 tablet 6  . acetaminophen (TYLENOL) 325 MG tablet Take 650 mg by mouth every 6 (six) hours as needed (pain).    . furosemide (LASIX) 20 MG tablet Take 20 mg by mouth daily as needed for fluid.     . nitroGLYCERIN (NITROSTAT) 0.4 MG SL tablet Place 1 tablet (0.4 mg total) under the tongue every 5 (five) minutes as needed for chest pain. (Patient not taking: Reported on 06/23/2015) 30 tablet 6  . promethazine (PHENERGAN) 12.5 MG tablet Take 1 tablet (12.5 mg total) by mouth every 6 (six) hours as needed for nausea or vomiting. (Patient not taking: Reported on 06/23/2015) 30 tablet 0  . warfarin (COUMADIN) 2 MG tablet Take 2mg  daily, except takes 4mg  on Monday and Friday. (Patient taking differently: Take 2 mg by mouth daily. ) 50 tablet 6  . zinc gluconate 50 MG tablet Take 100 mg by mouth at bedtime.       No current facility-administered medications for this encounter.    Demerol  REVIEW OF SYSTEMS: All systems negative except as listed in HPI, PMH and Problem list.  LVAD INTERROGATION:   HeartMate II LVAD:   LVAD Interrogation: Flow: 4.4 Speed: 9200 PI: 5.1 Power: 5.6   PI events 5-20 events.  Primary Controller: Replace back up battery  Back up controller: Replace back up battery  I reviewed the LVAD parameters  from today, and compared the results to the patient's prior recorded data.  Increased set speed to 9200 and back-up speed to 8600. The LVAD is functioning within specified parameters.  The patient performs LVAD self-test daily.  LVAD interrogation was negative for any significant power changes, alarms or PI events/speed drops.  LVAD equipment check completed and is in good working order.  Back-up equipment present.   LVAD education done on emergency procedures and precautions and reviewed exit site care.    BRA30 Physical Exam: GENERAL: NAD multiple injuries which are wrapped HEENT: swelling and laceration to R ear and bridge of nose. Periorbital ecchymosis NECK: Supple, JVP flat  2+ bilaterally, no bruits.  No lymphadenopathy or thyromegaly appreciated.   CARDIAC:  Mechanical heart sounds with LVAD hum present.  LUNGS:  clear.  ABDOMEN:  Soft, round, nontender, positive bowel sounds x4.     LVAD exit site: well-healed and incorporated.  Dressing dry and intact.  No erythema or drainage.  Stabilization device present and accurately applied.  Driveline dressing is being changed weekly per sterile technique. EXTREMITIES:  Warm and dry, no cyanosis, clubbing, multiple skin tears and abrasions. R little finger in splint. RLE  hematoma mild edmea.  NEUROLOGIC:  Alert and oriented x 4.  Gait steady.  No aphasia.  No dysarthria.  Affect pleasant.      ASSESSMENT AND PLAN:  1. Mechanical fall with multiple injuries 06/13/2015  - No further falls.   2. Chronic systolic HF: s/p  LVAD 94/0768 - He continues to grow weaker - NYHA III symptoms. Volume status stable. Continue  20 mg lasix as needed.    - He is not on a BB with history of RV failure.  - MAP a little low. No dizziness.  - No ACE-I d/t increased his renal function in the past.  3. Afib/Flutter - s/p DC-CV 06/04/14. With repeat DC-CV 03/01/25.  Continue current dose of amio to 200 daily due to nausea/gagging. Continue adjunctive Ranexa. Has follow up with Dr Forde Dandy for evaluation abnormal thyroid.  4 HTN -MAP ok today.Continue current regimen 5. LVAD - LVAD parameters reviewed personally - stable.  - 6.CKD, stage III -BMET ok   7. Anticoagulation management: Goal INR 2-2.5  - INR ok  8. Hematuria - Resolved and stent has been removed.  9. S/P Renal Stone lithotripsy- Per Dr [name]. 10. h/oGIB  - s/p clipping of 2 jejunal AVMs in 4/16. HGB down a little today. 11./6>9.9. Increase iron to twice a day. Check CBC next and transfuse if continues to fall. No evidence of bleeding. .  11. L4 compression fracture  - Improved  12.  Hypothyroid- getting f/u with Dr. Forde Dandy  Follow up in 4 weeks. Check CBC next week. If continues to trend down will need transfusion.   Frank Quincy  NP-C  11:20 AM

## 2015-06-23 NOTE — Progress Notes (Signed)
Presents today with [name] (significant other).   Symptom  Yes  No  Details   Angina       x Activity:   Claudication        x How far:   Syncope        x When:     Stroke        x        TIA  2001  Orthopnea         x How many pillows: 1  PND         x How often:  CPAP       N/A How many hrs:   Pedal edema        x        R>L baseline + injury  Abd fullness               x    N&V               x  "gagging episodes" resolved with gabapentin  Diaphoresis               x   Bleeding              x   Urine color        yellow  SOB        x      Activity: incline, no more than normal.   Palpitations        x When:   ICD shock        x   Hospitlizaitons                 x When/where/why:   ED visit        x When/where/why:  Other MD        x       When/who/why:  06/17/15 - PCP for right lower leg edema/redness  Activity        Off work due to injuries  Fluid    No limitations  < 2000   Diet    Low salt food   Vital signs: HR: 71 MAP BP: 72 Auto cuff:  93/64 (73) O2 Sat:  97% Wt:  205.2  lbs Last wt: 205.2  lbs  Ht: 6'0"  LVAD interrogation reveals:  Speed:   9200  Flow: 4.4 Power: 5.6 PI: 5.1 Alarms: few low voltage advisories Events:  5 - 10 PI events Fixed speed:  9200  Low speed limit:  8600 Primary Controller:  Replace back up battery in 13 months  (Feb 2017) Back up controller:   Replace back up battery in 13 months (Feb 2017)  Pt has appointment scheduled with Dr. Forde Dandy on 06/24/15 per Dr. Caryl Comes for abnormal thyroid studies and current use of amiodarone.   Pt was started on ten day course of Keflex 500 mg bid on 06/17/15 per PCP for possible cellulitus right lower leg (sustained trauma from fall on 06/13/15).  Pt Instructions: 1.  Increase iron to twice daily. 2.  Re-check CBC next week. 3.  Return to [place] clinic in 1 month.

## 2015-06-23 NOTE — Patient Instructions (Signed)
1.  Increase iron to twice daily. 2.  Re-check CBC next week. 3.  Return to [place] clinic in 1 month.

## 2015-06-30 ENCOUNTER — Ambulatory Visit: Payer: Medicare Other | Admitting: Infectious Diseases

## 2015-06-30 ENCOUNTER — Other Ambulatory Visit: Payer: Self-pay | Admitting: Internal Medicine

## 2015-06-30 LAB — POCT INR: INR: 2.3

## 2015-07-01 LAB — CBC/DIFF AMBIGUOUS DEFAULT
BASOS ABS: 0 10*3/uL (ref 0.0–0.2)
Basos: 1 %
EOS (ABSOLUTE): 0.3 10*3/uL (ref 0.0–0.4)
Eos: 4 %
HEMOGLOBIN: 10.1 g/dL — AB (ref 12.6–17.7)
Hematocrit: 32 % — ABNORMAL LOW (ref 37.5–51.0)
IMMATURE GRANULOCYTES: 0 %
Immature Grans (Abs): 0 10*3/uL (ref 0.0–0.1)
LYMPHS ABS: 0.6 10*3/uL — AB (ref 0.7–3.1)
Lymphs: 11 %
MCH: 30.1 pg (ref 26.6–33.0)
MCHC: 31.6 g/dL (ref 31.5–35.7)
MCV: 95 fL (ref 79–97)
MONOCYTES: 15 %
Monocytes Absolute: 0.9 10*3/uL (ref 0.1–0.9)
NEUTROS PCT: 69 %
Neutrophils Absolute: 4 10*3/uL (ref 1.4–7.0)
Platelets: 226 10*3/uL (ref 150–379)
RBC: 3.36 x10E6/uL — AB (ref 4.14–5.80)
RDW: 17.8 % — AB (ref 12.3–15.4)
WBC: 5.8 10*3/uL (ref 3.4–10.8)

## 2015-07-04 ENCOUNTER — Telehealth (HOSPITAL_COMMUNITY): Payer: Self-pay | Admitting: Infectious Diseases

## 2015-07-04 NOTE — Telephone Encounter (Signed)
Discussed CBC results with [name] and that H

## 2015-07-07 ENCOUNTER — Ambulatory Visit (HOSPITAL_COMMUNITY): Payer: Medicare Other | Admitting: *Deleted

## 2015-07-07 LAB — POCT INR: INR: 2.1

## 2015-07-08 ENCOUNTER — Telehealth (HOSPITAL_COMMUNITY): Payer: Self-pay | Admitting: *Deleted

## 2015-07-08 NOTE — Telephone Encounter (Signed)
[name] called to report gagging episodes have returned to patient, she has noticed "jerking of his right hand", and he is "unsteady of his feet".  Pt saw Dr. Rush Farmer yesterday for back discomfort and was told he has not re-injured his back. Dr. Haroldine Laws updated - instructed [name] to stop Amiodarone. Will see pt next Thursday in clinic at [place] her to call if symptoms worsen or do not improve; she verbalized understanding of same.

## 2015-07-11 IMAGING — CR DG CHEST 2V
2 series · 2 of 2 positions shown · non-contrast
Comparison: 09/13/2014

CLINICAL DATA: Ventricular assist device placement

EXAM:
CHEST  2 VIEW

[w chest pa]
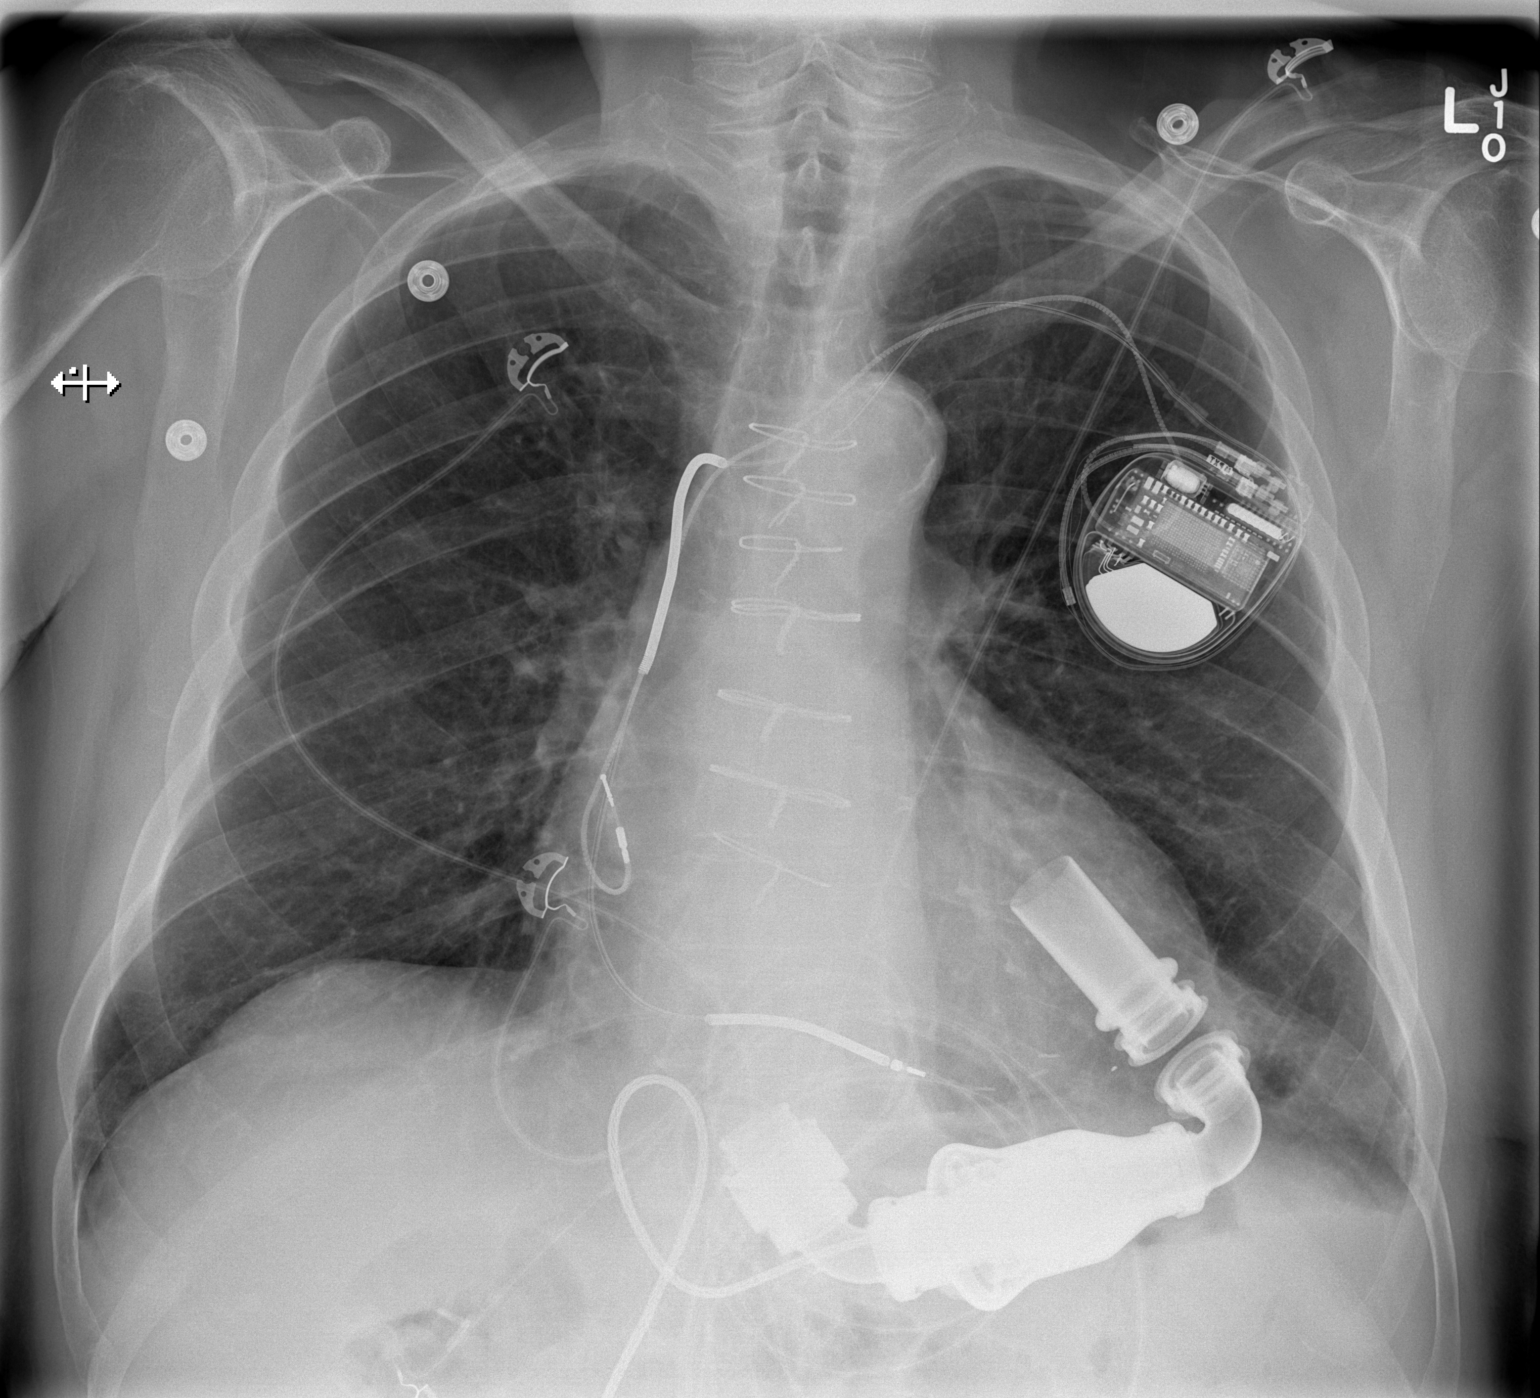

[w chest lat]
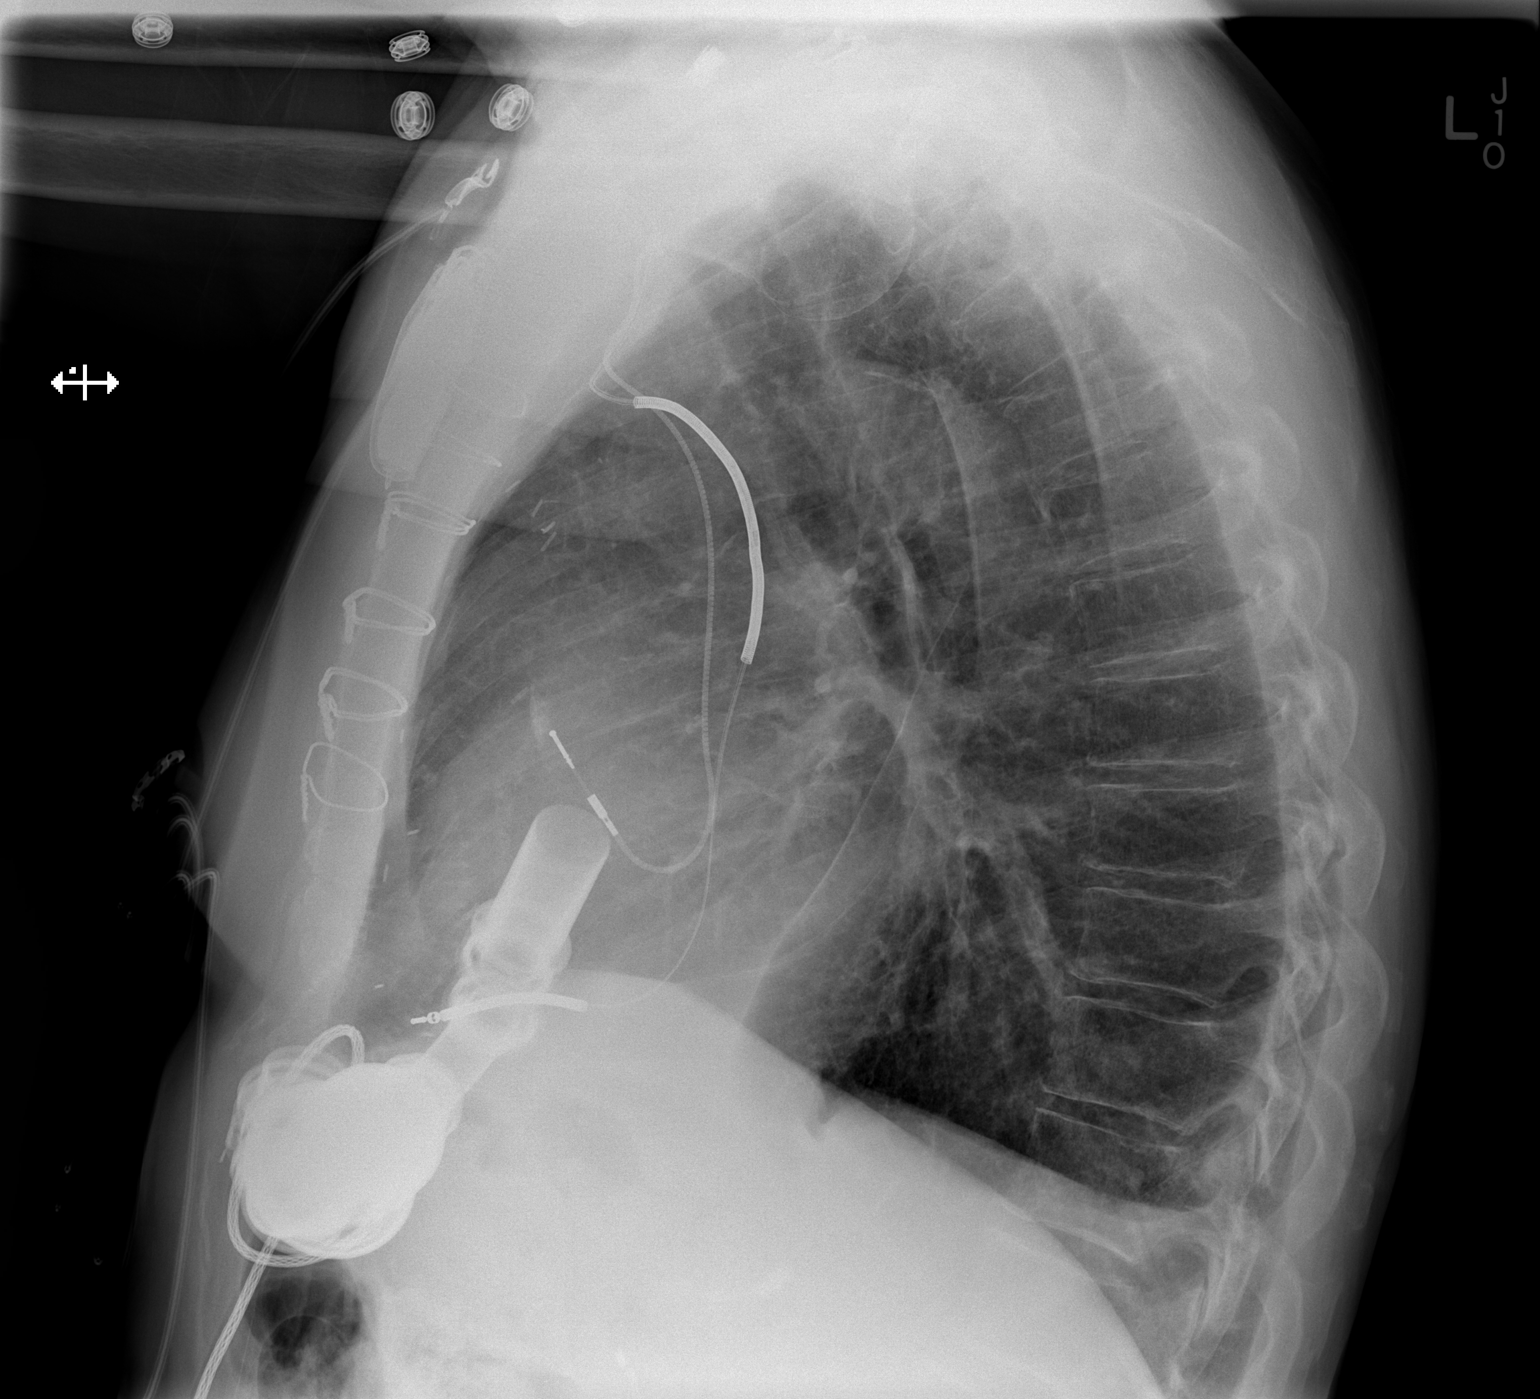

[2 of 2 positions shown; findings below may reference images not displayed]

FINDINGS: The ventricular assist device is stable in position. Support wires
and catheters remain in place without change. Vascular congestion
has resolved. Tiny left pleural effusion persists. There is no
pneumothorax.
IMPRESSION: Resolved of vascular congestion.

Tiny left pleural effusion persists.

No evidence of pneumothorax.

## 2015-07-14 ENCOUNTER — Encounter (HOSPITAL_COMMUNITY): Payer: Self-pay

## 2015-07-14 ENCOUNTER — Other Ambulatory Visit (HOSPITAL_COMMUNITY): Payer: Self-pay | Admitting: Infectious Diseases

## 2015-07-14 ENCOUNTER — Ambulatory Visit (HOSPITAL_COMMUNITY)
Admission: RE | Admit: 2015-07-14 | Discharge: 2015-07-14 | Disposition: A | Discharge status: Home / Self Care | Payer: Medicare Other | Source: Ambulatory Visit | Attending: Cardiology | Admitting: Cardiology

## 2015-07-14 ENCOUNTER — Ambulatory Visit (HOSPITAL_COMMUNITY): Payer: Self-pay | Admitting: Infectious Diseases

## 2015-07-14 VITALS — BP 112/52 | HR 69 | Ht 72.0 in | Wt 199.2 lb

## 2015-07-14 DIAGNOSIS — E039 Hypothyroidism, unspecified: Secondary | ICD-10-CM

## 2015-07-14 DIAGNOSIS — G4730 Sleep apnea, unspecified: Secondary | ICD-10-CM | POA: Insufficient documentation

## 2015-07-14 DIAGNOSIS — Z7901 Long term (current) use of anticoagulants: Secondary | ICD-10-CM

## 2015-07-14 DIAGNOSIS — Z95811 Presence of heart assist device: Secondary | ICD-10-CM

## 2015-07-14 DIAGNOSIS — I4892 Unspecified atrial flutter: Secondary | ICD-10-CM | POA: Insufficient documentation

## 2015-07-14 DIAGNOSIS — Z87442 Personal history of urinary calculi: Secondary | ICD-10-CM | POA: Insufficient documentation

## 2015-07-14 DIAGNOSIS — I252 Old myocardial infarction: Secondary | ICD-10-CM | POA: Insufficient documentation

## 2015-07-14 DIAGNOSIS — I5022 Chronic systolic (congestive) heart failure: Secondary | ICD-10-CM

## 2015-07-14 DIAGNOSIS — I129 Hypertensive chronic kidney disease with stage 1 through stage 4 chronic kidney disease, or unspecified chronic kidney disease: Secondary | ICD-10-CM | POA: Insufficient documentation

## 2015-07-14 DIAGNOSIS — Z8673 Personal history of transient ischemic attack (TIA), and cerebral infarction without residual deficits: Secondary | ICD-10-CM | POA: Insufficient documentation

## 2015-07-14 DIAGNOSIS — Z85820 Personal history of malignant melanoma of skin: Secondary | ICD-10-CM | POA: Insufficient documentation

## 2015-07-14 DIAGNOSIS — Z95828 Presence of other vascular implants and grafts: Secondary | ICD-10-CM | POA: Diagnosis not present

## 2015-07-14 DIAGNOSIS — I2510 Atherosclerotic heart disease of native coronary artery without angina pectoris: Secondary | ICD-10-CM | POA: Insufficient documentation

## 2015-07-14 DIAGNOSIS — I480 Paroxysmal atrial fibrillation: Secondary | ICD-10-CM

## 2015-07-14 DIAGNOSIS — R0600 Dyspnea, unspecified: Secondary | ICD-10-CM

## 2015-07-14 DIAGNOSIS — R252 Cramp and spasm: Secondary | ICD-10-CM

## 2015-07-14 DIAGNOSIS — I255 Ischemic cardiomyopathy: Secondary | ICD-10-CM | POA: Diagnosis not present

## 2015-07-14 DIAGNOSIS — E785 Hyperlipidemia, unspecified: Secondary | ICD-10-CM | POA: Insufficient documentation

## 2015-07-14 DIAGNOSIS — Z79899 Other long term (current) drug therapy: Secondary | ICD-10-CM | POA: Insufficient documentation

## 2015-07-14 DIAGNOSIS — N183 Chronic kidney disease, stage 3 (moderate): Secondary | ICD-10-CM | POA: Diagnosis not present

## 2015-07-14 DIAGNOSIS — I4891 Unspecified atrial fibrillation: Secondary | ICD-10-CM | POA: Diagnosis not present

## 2015-07-14 DIAGNOSIS — I739 Peripheral vascular disease, unspecified: Secondary | ICD-10-CM | POA: Diagnosis not present

## 2015-07-14 DIAGNOSIS — Z85828 Personal history of other malignant neoplasm of skin: Secondary | ICD-10-CM | POA: Diagnosis not present

## 2015-07-14 DIAGNOSIS — R0609 Other forms of dyspnea: Secondary | ICD-10-CM

## 2015-07-14 LAB — CBC
HEMATOCRIT: 36.9 % — AB (ref 39.0–52.0)
Hemoglobin: 11.9 g/dL — ABNORMAL LOW (ref 13.0–17.0)
MCH: 30.5 pg (ref 26.0–34.0)
MCHC: 32.2 g/dL (ref 30.0–36.0)
MCV: 94.6 fL (ref 78.0–100.0)
PLATELETS: 170 10*3/uL (ref 150–400)
RBC: 3.9 MIL/uL — ABNORMAL LOW (ref 4.22–5.81)
RDW: 17.9 % — AB (ref 11.5–15.5)
WBC: 6.9 10*3/uL (ref 4.0–10.5)

## 2015-07-14 LAB — COMPREHENSIVE METABOLIC PANEL
ALK PHOS: 128 U/L — AB (ref 38–126)
ALT: 25 U/L (ref 17–63)
AST: 36 U/L (ref 15–41)
Albumin: 3.9 g/dL (ref 3.5–5.0)
Anion gap: 7 (ref 5–15)
BILIRUBIN TOTAL: 0.6 mg/dL (ref 0.3–1.2)
BUN: 17 mg/dL (ref 6–20)
CALCIUM: 9.3 mg/dL (ref 8.9–10.3)
CO2: 27 mmol/L (ref 22–32)
CREATININE: 1.36 mg/dL — AB (ref 0.61–1.24)
Chloride: 104 mmol/L (ref 101–111)
GFR, EST AFRICAN AMERICAN: 57 mL/min — AB (ref >60)
GFR, EST NON AFRICAN AMERICAN: 49 mL/min — AB (ref >60)
Glucose, Bld: 110 mg/dL — ABNORMAL HIGH (ref 65–99)
Potassium: 4.4 mmol/L (ref 3.5–5.1)
Sodium: 138 mmol/L (ref 135–145)
TOTAL PROTEIN: 6.8 g/dL (ref 6.5–8.1)

## 2015-07-14 LAB — PROTIME-INR
INR: 2.56 — ABNORMAL HIGH (ref 0.00–1.49)
PROTHROMBIN TIME: 27.2 s — AB (ref 11.6–15.2)

## 2015-07-14 LAB — BRAIN NATRIURETIC PEPTIDE: B Natriuretic Peptide: 511.1 pg/mL — ABNORMAL HIGH (ref 0.0–100.0)

## 2015-07-14 LAB — VITAMIN B12: Vitamin B-12: 423 pg/mL (ref 180–914)

## 2015-07-14 LAB — MAGNESIUM: MAGNESIUM: 2.3 mg/dL (ref 1.7–2.4)

## 2015-07-14 LAB — LACTATE DEHYDROGENASE: LDH: 270 U/L — AB (ref 98–192)

## 2015-07-14 LAB — FOLATE: Folate: 36.6 ng/mL (ref >5.9)

## 2015-07-14 MED ORDER — WARFARIN SODIUM 2 MG PO TABS: ORAL_TABLET | ORAL | Status: DC

## 2015-07-14 MED ORDER — LEVOTHYROXINE SODIUM 200 MCG PO TABS: ORAL_TABLET | ORAL | Status: DC

## 2015-07-14 MED ORDER — AMLODIPINE BESYLATE 10 MG PO TABS
5.0000 mg | ORAL_TABLET | Freq: Every day | ORAL | Status: DC
Start: 2015-07-14 — End: 2015-08-16

## 2015-07-14 NOTE — Progress Notes (Signed)
VAD CLINIC PROGRESS NOTE   PCP: Dr. Karolee Stamps ([place])  [name] is a 74 yo male with h/o VT, PAD and severe systolic HF (EF 62-56%) due to mixed ischemic/nonischemic CM he is s/p HM II LVAD implant for destination therapy on October 19, 2013. On 09/01/14 underwent cystoscopy and flexible ureteroscopy with lithotripsy and basket extraction.   VAD implantation was complicated by VT, syncope, A fib/Aflutter and persistent low output felt to be due to RHF. RHC showed low cardiac output but no evidence of RV failure on Milrinone. He was felt to be volume depleted. He was discharged home on 11/18/13 on mirlinone and PRN lasix 20 mg for a weight of 200 pounds or greater. Milrinone has since been weaned off.   RHC (07/13/14): Well compensated hemodynamics with VAD support.  Admitted 11/9-11/19/15 for hematuria and chronic nephrolithiasis. Underwent Echo to assess RV for concern RV failure because he presented with SOB. RV looked good and had renal US showed mild hyronephrosis and bilateral renal cysts. Dr. Risa Grill took back for repeat lithotripsy and stone extraction by ureteroscopy and J stent placement. Received total of 3 PRBCs. D/C weight 188 lbs.   Over past month has had 3 admits. In 4/16 initially admitted with weakness and fall. In ER hemoglobin was found to be 5.4 with MAP 78.  While hospitalized he received 5UPRBCs. 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 Then readmitted with acute L4 compression fracture. Most recently admitted with weakness and low PI. Found to have recurrent atrial tach and underwent TEE/DCC on 03/02/15  Follow up LVAD:  Feels ok. Only real complaint is spontaneous jerking movements of extremities.  Amio stopped last visit without any benefit. + edema. Mild dyspnea. No further falls. Gagging episodes improved with gabapentin. Dr. Forde Dandy recently increased synthroid.   Denies LVAD alarms.  Denies driveline trauma,  erythema or drainage.  Denies ICD shocks. Reports taking Coumadin as prescribed and adherence to anticoagulation based dietary restrictions.  Denies bright red blood per rectum or melena, no dark urine or hematuria.      Past Medical History  Diagnosis Date  . Bradycardia   . Ventricular tachycardia     s/p AICD;   h/o ICD shock;  Amiodarone Rx. S/P ablation in 2012  . 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 HTN (hypertension)   . HLD (hyperlipidemia)   . Cytopenia   . Hypothyroidism   . Renal insufficiency   . Claudication   . Chronic systolic heart failure   . Ischemic cardiomyopathy     echo 7/12:  EF 25-30%, mild AI, mod MR, mod LAE  . TIA (transient ischemic attack) 2001  . CVD (cerebrovascular disease)     left CEA 2008  . Stroke   . Anemia   . Inappropriate therapy from implantable cardioverter-defibrillator 04/02/2013    ATP for sinus tach  SVT wavelet identified SVT but was no  passive   . Myocardial infarction 1992  . Anginal pain   . Automatic implantable cardioverter-defibrillator in situ     [employer]onic Evera device serial number [id number]    . Arthritis     "a little in my knees" (05/25/2013)  . Melanoma of ear 2013    "near left ear" (05/25/2013)  .  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 Dysrhythmia     HX A FIB  . History of nonmelanoma skin cancer     L EAR  . Kidney stone   . History of transfusion   . Sleep apnea     denies wearing mask (05/25/2013)  . CAD (coronary artery disease)     s/p MI 1982;  s/p DES to CFX 2011;  s/p NSTEMI 4/12 in setting of VTach;  cath 7/12:  LM ok, LAD mid 30-40%, Dx's with 40%, mCFX stent patent, prox to mid RCA occluded with R to R and L to R collats.  Medical Tx was continued  . CHF (congestive heart failure)     HAS VENTRICULAR PUMP    Current Outpatient Prescriptions  Medication Sig  Dispense Refill  . acetaminophen (TYLENOL) 325 MG tablet Take 650 mg by mouth every 6 (six) hours as needed (pain).    [name] amLODipine (NORVASC) 10 MG tablet Take 0.5 tablets (5 mg total) by mouth daily. 30 tablet 6  . atorvastatin (LIPITOR) 80 MG tablet Take 80 mg by mouth daily.     [name] docusate sodium (COLACE) 100 MG capsule Take 200 mg by mouth daily. Taking every other day    . ferrous gluconate (FERGON) 324 MG tablet Take 1 tablet (324 mg total) by mouth 2 (two) times daily with a meal. 60 tablet 6  . furosemide (LASIX) 20 MG tablet Take 20 mg by mouth daily as needed for fluid.     [name] gabapentin (NEURONTIN) 300 MG capsule Take 300 mg by mouth 2 (two) times daily.     [name] levothyroxine (SYNTHROID, LEVOTHROID) 200 MCG tablet Take one 200 mcg daily before breakfast every day with 1 1/2 (300 mcg) every Monday. 30 tablet 6  . magnesium gluconate (MAGONATE) 500 MG tablet Take 1,000 mg by mouth at bedtime.     . Multiple Vitamin (MULTIVITAMIN WITH MINERALS) TABS tablet Take 1 tablet by mouth at bedtime.    . pantoprazole (PROTONIX) 40 MG tablet Take 1 tablet (40 mg total) by mouth 2 (two) times daily. 60 tablet 6  . RANEXA 500 MG 12 hr tablet TAKE 1 TABLET (500 MG TOTAL) BY MOUTH 2 (TWO) TIMES DAILY. 60 tablet 6  . traMADol (ULTRAM) 50 MG tablet Take 50 mg by mouth 2 (two) times daily.    [name] warfarin (COUMADIN) 2 MG tablet As directed 50 tablet 6  . zinc gluconate 50 MG tablet Take 100 mg by mouth at bedtime.      No current facility-administered medications for this encounter.    Demerol  REVIEW OF SYSTEMS: All systems negative except as listed in HPI, PMH and Problem list.  LVAD INTERROGATION:   HeartMate II LVAD:     LVAD interrogation reveals:  Speed: 9200  Flow: 4.4 Power: 5.6 PI: 3.9 - 6.5 fluctuates often during clinic visits Alarms: few low voltage advisories Events: 10 - 20 PI events (up from 5 - 10) with some days he only has <5. Had 59 events on 07/07/15. Cannot recall details  of how he felt that day .  Fixed speed: 9200  Low speed limit: 8600 Primary Controller: Replace back up battery in 13 months (Feb 2017)  I reviewed the LVAD parameters from today, and compared the results to the patient's prior recorded data.  Increased set speed to 9200 and back-up speed to 8600. The LVAD is functioning within specified parameters.  The patient performs LVAD self-test daily.  LVAD interrogation was negative for any significant power changes, alarms or PI events/speed drops.  LVAD equipment check completed and is in good working order.  Back-up equipment present.   LVAD education done on emergency procedures and precautions and reviewed exit site care.   Vitals HR: 69 MAP BP: 110 Auto cuff: 112/52 (64) O2 Sat: Not picking up  Wt: 199.2 lbs Last wt: 205.2 lbs Ht: 6'0"  Physical Exam: GENERAL: NAD HEENT: normal NECK: Supple, JVP to jaw  2+ bilaterally, no bruits.  No lymphadenopathy or thyromegaly appreciated.   CARDIAC:  Mechanical heart sounds with LVAD hum present.  LUNGS:  clear.  ABDOMEN:  Soft, round, nontender, positive bowel sounds x4.     LVAD exit site: well-healed and incorporated.  Dressing dry and intact.  No erythema or drainage.  Stabilization device present and accurately applied.  Driveline dressing is being changed weekly per sterile technique. EXTREMITIES:  Warm and dry, no cyanosis, clubbing, 2+ edema R>L NEUROLOGIC:  Alert and oriented x 4.  Gait steady.  No aphasia.  No dysarthria.  Affect pleasant. FTN ok. No dysdiadochokinesis     ASSESSMENT AND PLAN:  1. Chronic systolic HF: s/p LVAD 73/4287 - Relatively stable though weak at times. NYHA II-III + volume overload  - Take lasix 20mg  per day x 2 days. Continue  20 mg lasix as needed.   May need to take M/W/F - He is not on a BB with history of RV failure.  - MAP a little low. Decrease amlodipine to 5mg  qhs - No ACE-I d/t increased his renal function in the past.  3. Afib/Flutter -  s/p DC-CV 06/04/14. With repeat DC-CV 03/01/25.  Now off amio due to thyrotoxicity. Watch closely for recurrent AF 4 HTN -MAP low. Dropping amlodione 5. LVAD - LVAD parameters reviewed personally. Multiple PI events. Recent ramp study reviewed. Will continue 9200 for now 6.CKD, stage III -BMET ok   7. Anticoagulation management: Goal INR 2-2.5  - INR ok  8. Hematuria - Resolved and stent has been removed.  9. S/P Renal Stone lithotripsy- Per Dr Risa Grill. 10. h/oGIB  - s/p clipping of 2 jejunal AVMs in 4/16. HGB down a little today. 11./6>9.9. Increase iron to twice a day. Check CBC next and transfuse if continues to fall. No evidence of bleeding. .  11.  Hypothyroid- getting f/u with Dr. Forde Dandy 12. Jerkin movements - no improvement off amio. No cerebellar signs on exam. Will check B12/foalte. Refer to Er[facility] Kn[facility] neurology.   Follow up in 4 weeks.   Glori Bickers MD 10:41 AM

## 2015-07-14 NOTE — Progress Notes (Addendum)
Presents today with [name] (significant other) for sick visit with complaints of tremors and wobbling.   Symptom  Yes  No  Details   Angina       x Activity:   Claudication        x How far:   Syncope        x When:     Stroke        x        TIA  2001  Orthopnea         x How many pillows: 1  PND         x How often:  CPAP       N/A How many hrs:   Pedal edema        x        R>L baseline + injury.   Abd fullness               x    N&V               x  "gagging episodes" resolved with gabapentin  Diaphoresis               x   Bleeding              x   Urine color        yellow  SOB        x      Activity: incline, no more than normal.   Palpitations        x When:   ICD shock        x   Hospitlizaitons                 x When/where/why:   ED visit        x When/where/why:  Other MD        x       When/who/why:  06/17/15 - PCP for right lower leg edema/redness  Activity        Off work due to injuries  Fluid    No limitations . Tries to drink the same amount of fluid every day.   Diet    Low salt food   Vital signs: HR: 69 MAP BP: 110 Auto cuff:  112/52 (64) O2 Sat:  Not picking up  Wt:  199.2  lbs Last wt: 205.2  lbs Ht: 6'0"  LVAD interrogation reveals:  Speed:   9200  Flow: 4.4 Power: 5.6 PI: 3.9 - 6.5 fluctuates often during clinic visits Alarms: few low voltage advisories Events: 10 - 20 PI events (up from 5 - 10) with some days he only has <5. Had 59 events on 07/07/15. Cannot recall details of how he felt that day .  Fixed speed:  9200  Low speed limit:  8600 Primary Controller:  Replace back up battery in 13 months  (Feb 2017) Back up controller:   Replace back up battery in 13 months (Feb 2017)  Encounter Details:  Presents to clinic today with complaints of weakness in legs (since April 2016 L4 Fx), new onset jerky tremors of bilateral hands. Onset was 3 weeks ago; not observed at rest but notices it more when he is doing purposeful movements like writing or  reading his newspaper. Jerky movements start right away usually unilaterally but progresses to bilateral jerky movements quickly and is unable to perform some tasks. Gagging episodes are still present however lessened with the gabapentin. [name] also  informs me that he has had several episodes of urinary incontinence at night lately as well.   Pt saw Dr. Forde Dandy (endocrinology) on 06/24/15--increased Synthroid dose recently at end of August.   Pt completed a ten day course of Keflex 500 mg bid (for presumed cellulitis after trauma/hematoma to RLL). Looks better today. Skin is peeling after swelling has subsided. Still black and blue, but no current sx of bleeding.   Swelling in feet is 2 - 3+ (worse on R>L). JVP to jaw today. Lasix x 2 days and decrease Amlodipine to 5 mg QD (MAP marginal at 65) . Consider dropping to 9000 if PI events continue to increase. INR therapeutic today, LDH stable at 270 (baseline 230 - 270). Folate and VB12 also drawn today per Dr. Haroldine Laws.   RTC in 4 weeks or sooner if needed. Dr. Haroldine Laws set up referral with with Neurology at [facility] (Dr. Minda Meo).   Janene Madeira, RN VAD Coordinator   Office: [phone] 24/7 VAD Pager: [phone]

## 2015-07-14 NOTE — Addendum Note (Signed)
Encounter addended by: [place] Callas, RN on: 07/14/2015 12:24 PM<BR>     Documentation filed: Notes Section

## 2015-07-14 NOTE — Patient Instructions (Signed)
1. Decrease your Amlodipine dose to 5 mg (1/2 pill) every evening for your BP.   2. Take 20 mg (1 pill) of lasix today and tomorrow for increased fluid.   3. Caution with changing positions and be on the look out for dizziness--go from sitting to standing slowly.   4. INR 2.5 today so no change in coumadin dose. Will see what your finger stick is next week.   5. Keep your VAD appointment on 07/28/15 for now but call us sooner if you need to.

## 2015-07-15 ENCOUNTER — Encounter (HOSPITAL_COMMUNITY): Payer: Self-pay | Admitting: Infectious Diseases

## 2015-07-15 NOTE — Progress Notes (Signed)
Attempted to schedule patient with Dr. Cristela Blue or Dr. Ileene Rubens with [facility]. [facility] isn't taking patients with Jakyri's symptoms and Cristela Blue has no availability until January. First available with any neurologist at the clinic is not until January 3rd.

## 2015-07-21 ENCOUNTER — Other Ambulatory Visit (HOSPITAL_COMMUNITY): Payer: Self-pay | Admitting: Infectious Diseases

## 2015-07-21 ENCOUNTER — Telehealth (HOSPITAL_COMMUNITY): Payer: Self-pay | Admitting: Infectious Diseases

## 2015-07-21 ENCOUNTER — Encounter (HOSPITAL_COMMUNITY): Payer: Self-pay | Admitting: Infectious Diseases

## 2015-07-21 LAB — POCT INR: INR: 2.6

## 2015-07-21 NOTE — Telephone Encounter (Signed)
Called requesting for Dr. Haroldine Laws to send a letter taking him out of work indefinitely d/t his medical conditions. Letter sent.

## 2015-07-28 ENCOUNTER — Ambulatory Visit: Payer: Medicare Other | Admitting: Infectious Diseases

## 2015-07-28 ENCOUNTER — Encounter (HOSPITAL_COMMUNITY): Payer: Medicare Other

## 2015-07-28 LAB — POCT INR: INR: 2.4

## 2015-08-02 ENCOUNTER — Ambulatory Visit (INDEPENDENT_AMBULATORY_CARE_PROVIDER_SITE_OTHER): Payer: Medicare Other | Admitting: Neurology

## 2015-08-02 ENCOUNTER — Encounter: Payer: Self-pay | Admitting: Neurology

## 2015-08-02 VITALS — Ht 72.0 in | Wt 200.0 lb

## 2015-08-02 DIAGNOSIS — G253 Myoclonus: Secondary | ICD-10-CM | POA: Diagnosis not present

## 2015-08-02 DIAGNOSIS — T50905A Adverse effect of unspecified drugs, medicaments and biological substances, initial encounter: Secondary | ICD-10-CM

## 2015-08-02 DIAGNOSIS — R2681 Unsteadiness on feet: Secondary | ICD-10-CM

## 2015-08-02 DIAGNOSIS — G609 Hereditary and idiopathic neuropathy, unspecified: Secondary | ICD-10-CM | POA: Diagnosis not present

## 2015-08-02 NOTE — Progress Notes (Signed)
[name] was seen today in neurologic consultation at the request of Dr. Haroldine [name].  His PCP is Frank Ishikawa, MD.  The consultation is for the evaluation of "jerking movements."  He is accompanied by his significant other who supplements the history.  The records that were made available to me were reviewed.  Pt reports that these abnormal movements started in approximately august.  He thinks that they started after he fell backwards down stairs.  He fell down down 13 stairs; he doesn't think that he had LOC.  He had a CT brain at [facility] and was told it was normal.  This is unavailable for my review.  He dislocated his small finger.  He had a hematoma on his leg.  A few weeks later he noted the jerky movements, that he doesn't know if are related.  He may have the movement several times in a day and then may not have them for several days.  He never has the movements of the hands are in the lab and they have to be held antigravity.  It may last for a few seconds up to a minute.  He also has noted an unsteady gait that is fairly new.  He was working as a Presenter, broadcasting until the fall (was at work when he fell) and hasn't been back to work since because of unsteady gait.  He does state that he has a history of compression fracture and followed up with Dr. Ninfa [name] since the fall and was told that nothing got worse and his compression fracture has healed.  He cannot have an MRI because of his defibrillator.  His amiodarone was d/c on 07/08/15.  He is on gabapentin 300 mg bid and was started on that after the fall for gagging episodes; the gagging episodes apparently are not new and he has had them in the remote past as well.  He does not think that the gabapentin is helping. His creatinine was recently 1.36.  His last liver function testing was normal.  ALLERGIES:   Allergies  Allergen Reactions  . Demerol [Meperidine] Other (See Comments)    Paralysis. Could only move eyes.    CURRENT MEDICATIONS:   Outpatient Encounter Prescriptions as of 08/02/2015  Medication Sig  . acetaminophen (TYLENOL) 325 MG tablet Take 650 mg by mouth every 6 (six) hours as needed (pain).  [name] amLODipine (NORVASC) 10 MG tablet Take 0.5 tablets (5 mg total) by mouth daily.  [name] atorvastatin (LIPITOR) 80 MG tablet Take 80 mg by mouth daily.   [name] docusate sodium (COLACE) 100 MG capsule Take 200 mg by mouth daily. Taking every other day  . ferrous gluconate (FERGON) 324 MG tablet Take 1 tablet (324 mg total) by mouth 2 (two) times daily with a meal.  . furosemide (LASIX) 20 MG tablet Take 20 mg by mouth daily as needed for fluid.   [name] gabapentin (NEURONTIN) 300 MG capsule Take 300 mg by mouth 2 (two) times daily.   [name] levothyroxine (SYNTHROID, LEVOTHROID) 200 MCG tablet Take one 200 mcg daily before breakfast every day with 1 1/2 (300 mcg) every Monday.  . magnesium gluconate (MAGONATE) 500 MG tablet Take 1,000 mg by mouth at bedtime.   . Multiple Vitamin (MULTIVITAMIN WITH MINERALS) TABS tablet Take 1 tablet by mouth at bedtime.  . pantoprazole (PROTONIX) 40 MG tablet Take 1 tablet (40 mg total) by mouth 2 (two) times daily.  [name] RANEXA 500 MG 12 hr tablet TAKE 1 TABLET (500  MG TOTAL) BY MOUTH 2 (TWO) TIMES DAILY.  . traMADol (ULTRAM) 50 MG tablet Take 50 mg by mouth 2 (two) times daily.  [name] warfarin (COUMADIN) 2 MG tablet As directed  . zinc gluconate 50 MG tablet Take 100 mg by mouth at bedtime.    No facility-administered encounter medications on file as of 08/02/2015.    PAST MEDICAL HISTORY:   Past Medical History  Diagnosis Date  . Bradycardia   . Ventricular tachycardia     s/p AICD;   h/o ICD shock;  Amiodarone Rx. S/P ablation in 2012  . 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 HTN (hypertension)   . HLD (hyperlipidemia)   . Cytopenia   . Hypothyroidism   . Renal insufficiency     . Claudication   . Chronic systolic heart failure   . Ischemic cardiomyopathy     echo 7/12:  EF 25-30%, mild AI, mod MR, mod LAE  . TIA (transient ischemic attack) 2001  . CVD (cerebrovascular disease)     left CEA 2008  . Stroke   . Anemia   . Inappropriate therapy from implantable cardioverter-defibrillator 04/02/2013    ATP for sinus tach  SVT wavelet identified SVT but was no  passive   . Myocardial infarction 1992  . Anginal pain   . Automatic implantable cardioverter-defibrillator in situ     Medtronic Evera device serial number [id number]    . Arthritis     "a Frank in my knees" (05/25/2013)  . Melanoma of ear 2013    "near left ear" (05/25/2013)  . 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 Dysrhythmia     HX A FIB  . History of nonmelanoma skin cancer     L EAR  . Kidney stone   . History of transfusion   . Sleep apnea     denies wearing mask (05/25/2013)  . CAD (coronary artery disease)     s/p MI 1982;  s/p DES to CFX 2011;  s/p NSTEMI 4/12 in setting of VTach;  cath 7/12:  LM ok, LAD mid 30-40%, Dx's with 40%, mCFX stent patent, prox to mid RCA occluded with R to R and L to R collats.  Medical Tx was continued  . CHF (congestive heart failure)     HAS VENTRICULAR PUMP    PAST SURGICAL HISTORY:   Past Surgical History  Procedure Laterality Date  . Femoral-popliteal bypass graft Right 2011  . Cardiac defibrillator placement  2003; 2005; 05/25/2013    2014: Medtronic Evera device serial number [id number]  . Revascularization / in-situ graft leg    . Renal artery bypass Bilateral 2003  . Back surgery    . Lumbar disc surgery  1974; 2000's  . Knee arthroscopy Bilateral 1990's  . Elbow arthroscopy Right 1990's  . Cholecystectomy  12/15/?2010  . Lipoma excision Left 2013    "near ear" (05/25/2013)  . Heel spur surgery Right 1990's  . Cataract extraction w/ intraocular lens  implant, bilateral Bilateral 2000  . Carotid endarterectomy Left ~ 2007  . Doppler  echocardiography  2011  . Tonsillectomy  1946  . Femoral-popliteal bypass graft Left 05/2002    Archie Endo 05/06/2002 (05/25/2013)  . Tumor excision Left 1960's    "fatty tumor" (05/25/2013)  . Cardiac electrophysiology  study and ablation  2012  . Coronary angioplasty  1992  . Cardiac catheterization      "several" (05/25/2013)  . Coronary angioplasty with stent placement      "last one was 11/2012  (05/25/2013)  . Cardioversion N/A 10/15/2013    Procedure: CARDIOVERSION;  Surgeon: Jolaine Artist, MD;  Location: [place];  Service: Cardiovascular;  Laterality: N/A;  . Insertion of implantable left ventricular assist device N/A 10/19/2013    Procedure: INSERTION OF IMPLANTABLE LEFT VENTRICULAR ASSIST DEVICE;  Surgeon: Gaye Pollack, MD;  Location: [place];  Service: Open Heart Surgery;  Laterality: N/A;  CIRC ARREST  NITRIC OXIDE  MEDTRONIC ICD  . Intraoperative transesophageal echocardiogram N/A 10/19/2013    Procedure: INTRAOPERATIVE TRANSESOPHAGEAL ECHOCARDIOGRAM;  Surgeon: Gaye Pollack, MD;  Location[facility] OR;  Service: Open Heart Surgery;  Laterality: N/A;  . Tee without cardioversion N/A 11/04/2013    Procedure: TRANSESOPHAGEAL ECHOCARDIOGRAM (TEE);  Surgeon: Larey Dresser, MD;  Location: [place];  Service: Cardiovascular;  Laterality: N/A;  . Cardioversion N/A 11/04/2013    Procedure: CARDIOVERSION;  Surgeon: Larey Dresser, MD;  Location: [place];  Service: Cardiovascular;  Laterality: N/A;  . Cardioversion N/A 06/04/2014    Procedure: CARDIOVERSION;  Surgeon: Jolaine Artist, MD;  Location: [facility] ENDOSCOPY;  Service: Cardiovascular;  Laterality: N/A;  . Cystoscopy with retrograde pyelogram, ureteroscopy and stent placement Left 08/09/2014    Procedure: [place], URETEROSCOPY AND STENT PLACEMENT;  Surgeon: Bernestine Amass, MD;  Location: [facility]S;  Service: Urology;  Laterality: Left;  . Cystoscopy with retrograde pyelogram, ureteroscopy and stent  placement Left 09/01/2014    Procedure: CYSTOSCOPY WITH URETEROSCOPY AND STENT REMOVAL;  Surgeon: Bernestine Amass, MD;  Location: [facility];  Service: Urology;  Laterality: Left;  . Holmium laser application Left 03/47/4259    Procedure: HOLMIUM LASER APPLICATION;  Surgeon: Bernestine Amass, MD;  Location: [facility];  Service: Urology;  Laterality: Left;  . Cystoscopy with retrograde pyelogram, ureteroscopy and stent placement Left 09/20/2014    Procedure: CYSTOSCOPY WITH RETROGRADE PYELOGRAM, URETEROSCOPY AND STENT PLACEMENT, stone removal;  Surgeon: Bernestine Amass, MD;  Location: [facility];  Service: Urology;  Laterality: Left;  Stephanie Coup ablation N/A 09/12/2011    Procedure: V-TACH ABLATION;  Surgeon: Evans Lance, MD;  Location: [facility] CATH LAB;  Service: Cardiovascular;  Laterality: N/A;  . Left heart catheterization with coronary angiogram N/A 11/24/2012    Procedure: LEFT HEART CATHETERIZATION WITH CORONARY ANGIOGRAM;  Surgeon: Peter M Martinique, MD;  Location: [facility] CATH LAB;  Service: Cardiovascular;  Laterality: N/A;  . Implantable cardioverter defibrillator generator change N/A 05/25/2013    Procedure: IMPLANTABLE CARDIOVERTER DEFIBRILLATOR GENERATOR CHANGE;  Surgeon: Deboraha Sprang, MD;  Location: [facility] CATH LAB;  Service: Cardiovascular;  Laterality: N/A;  . Lead revision N/A 06/05/2013    Procedure: LEAD REVISION;  Surgeon: Thompson Grayer, MD;  Location: [facility] CATH LAB;  Service: Cardiovascular;  Laterality: N/A;  . V-tach ablation N/A 06/08/2013    Procedure: V-TACH ABLATION;  Surgeon: Evans Lance, MD;  Location: [facility] CATH LAB;  Service: Cardiovascular;  Laterality: N/A;  . Right heart catheterization N/A 09/17/2013    Procedure: RIGHT HEART CATH;  Surgeon: Jolaine Artist, MD;  Location: [facility] CATH LAB;  Service: Cardiovascular;  Laterality: N/A;  . Right heart catheterization N/A 10/14/2013    Procedure: RIGHT HEART CATH;  Surgeon: Jolaine Artist, MD;  Location: [facility] CATH LAB;  Service: Cardiovascular;   Laterality:  N/A;  . Right heart catheterization N/A 11/16/2013    Procedure: RIGHT HEART CATH;  Surgeon: Jolaine Artist, MD;  Location: [facility] CATH LAB;  Service: Cardiovascular;  Laterality: N/A;  . Right heart catheterization N/A 07/13/2014    Procedure: RIGHT HEART CATH;  Surgeon: Jolaine Artist, MD;  Location: [facility] CATH LAB;  Service: Cardiovascular;  Laterality: N/A;  . Esophagogastroduodenoscopy N/A 02/15/2015    Procedure: ESOPHAGOGASTRODUODENOSCOPY (EGD);  Surgeon: Carol Ada, MD;  Location: [facility] ENDOSCOPY;  Service: Endoscopy;  Laterality: N/A;  LVAD  . Tee without cardioversion N/A 03/02/2015    Procedure: TRANSESOPHAGEAL ECHOCARDIOGRAM (TEE);  Surgeon: Jolaine Artist, MD;  Location: [place];  Service: Cardiovascular;  Laterality: N/A;  . Cardioversion N/A 03/02/2015    Procedure: CARDIOVERSION;  Surgeon: Jolaine Artist, MD;  Location: [facility] ENDOSCOPY;  Service: Cardiovascular;  Laterality: N/A;    SOCIAL HISTORY:   Social History   Social History  . Marital Status: Divorced    Spouse Name: N/A  . Number of Children: 2  . Years of Education: N/A   Occupational History  . retired     [employer]  . 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 Smoking status: Former Smoker -- 0.50 packs/day for 37 years    Types: Cigarettes    Quit date: 11/05/2001  . Smokeless tobacco: Never Used  . Alcohol Use: 0.0 oz/week     Comment: 05/25/2013 "mixed drink couple times/month"  . Drug Use: No  . Sexual Activity: No   Other Topics Concern  . Not on file   Social History Narrative   Lives with sig other.    FAMILY HISTORY:   Family Status  Relation Status Death Age  . Mother Deceased 68    cva  . Father Deceased     heart disease, stroke  . Brother Alive     No CAD  . Maternal Grandmother Deceased   . Maternal Grandfather Deceased   . Paternal Grandmother Deceased   . Paternal Grandfather Deceased      ROS:  As above, admits to significant low back pain.  A complete 10 system review of systems was obtained and was unremarkable apart from what is mentioned above.  PHYSICAL EXAMINATION:    VITALS:   Filed Vitals:   08/02/15 0746  Height: 6' (1.829 m)  Weight: 200 lb (90.719 kg)    GEN:  Normal appears male in no acute distress.  Appears stated age. HEENT:  Normocephalic, atraumatic. The mucous membranes are moist. The superficial temporal arteries are without ropiness or tenderness. Cardiovascular: Mechanical heart sound.  Hum of LVAD present Lungs: Clear to auscultation bilaterally. Neck/Heme: There are no carotid bruits noted bilaterally.  NEUROLOGICAL: Orientation:  The patient is alert and oriented x 3.  Fund of knowledge is appropriate.  Recent and remote memory intact.  Attention span and concentration normal.  Repeats and names without difficulty. Cranial nerves: There is good facial symmetry. The pupils are equal round and reactive to light bilaterally. Fundoscopic exam is attempted but the disc margins are not well visualized bilaterally.. Extraocular muscles are intact and visual fields are full to confrontational testing. Speech is fluent and clear. Soft palate rises symmetrically and there is no tongue deviation. Hearing is intact to conversational tone. Tone: Tone is good throughout. Sensation: Sensation is intact to light touch and pinprick throughout (facial, trunk, extremities). Vibration  is markedly decreased in a distal fashion. There is no extinction with double simultaneous stimulation. There is no sensory dermatomal level identified. Coordination:  The patient has no difficulty with RAM's or FNF bilaterally. Motor: Strength is 5/5 in the bilateral upper and lower extremities.  Shoulder shrug is equal and symmetric. There is no pronator drift.  There are no fasciculations noted. DTR's: Deep tendon reflexes are 2/4 at the bilateral biceps, triceps, brachioradialis,  patella and absent at the bilateral achilles.  Plantar responses are downgoing bilaterally. Gait and Station: The patients gait is very antalgic and wide-based.  He is mildly unsteady. Abnormal movements: No asterixis or myoclonus is noted.  However, the patient's significant other brings a video to show me.  There does appear to be negative myoclonus (asterixis) in the video versus positive myoclonus, but there is a newspaper that he is holding that is in the way that makes it difficult to clearly see the movements.  Lab Results  Component Value Date   VITAMINB12 423 07/14/2015     Chemistry      Component Value Date/Time   NA 138 07/14/2015 0945   NA 139 08/16/2014 1420   K 4.4 07/14/2015 0945   CL 104 07/14/2015 0945   CO2 27 07/14/2015 0945   BUN 17 07/14/2015 0945   BUN 22 08/16/2014 1420   CREATININE 1.36* 07/14/2015 0945      Component Value Date/Time   CALCIUM 9.3 07/14/2015 0945   ALKPHOS 128* 07/14/2015 0945   AST 36 07/14/2015 0945   ALT 25 07/14/2015 0945   BILITOT 0.6 07/14/2015 0945      Lab Results  Component Value Date   HGBA1C 5.5 10/18/2013    IMPRESSION/PLAN  1.  Myoclonus  -I suspect that this is due to gabapentin.  Gabapentin frequently causes myoclonus, especially in the face of renal insufficiency, even if the renal insufficiency is mild.  His abnormal movements started almost at the exact same time that the gabapentin was started.  He does not think that the gabapentin is helping (was started on it for gagging episodes that he has had since 2003) so we decided to go ahead and discontinue it.  -He is off of amiodarone (for about 3-4 weeks) but I don't suspect at all that this is due to amiodarone.  No tremor noted.   2.  Gait instability  -I suspect that this is multifactorial.  He had a significant fall in August and reports that this aggravated his back pain, associated with a prior compression fracture.  He is already seeing Dr. Ninfa [name] for this.  I  did tell him that physical therapy may be of value.  He cannot do water therapy, as he was told he could not get into the water because of his LVAD.  He wants to see over the next few weeks how he feels with trying some exercises at home that he was given by Dr. Ninfa [name].  -He definitely has some physical examination evidence of peripheral neuropathy (could be SE of amiodarone since other labs have been done and are unremarkable).  He and I discussed safety associated with peripheral neuropathy.  We talked about the fact that balance therapy could be of value, but balance therapy would be difficult to go through while he is having significant amount of back pain.  Once his back pain resolves, then this certainly could be valuable.  We discussed safety in detail. 3.  I will see him back on an as-needed basis, but  I encouraged him to call me within the next 4 weeks if the abnormal movements do not resolve.  Greater than 50% of the 45 minute visit was spent in counseling.

## 2015-08-03 ENCOUNTER — Ambulatory Visit (HOSPITAL_COMMUNITY)
Admission: RE | Admit: 2015-08-03 | Discharge: 2015-08-03 | Disposition: A | Discharge status: Home / Self Care | Payer: Medicare Other | Source: Ambulatory Visit | Attending: Cardiology | Admitting: Cardiology

## 2015-08-03 VITALS — BP 122/57 | HR 70 | Wt 199.8 lb

## 2015-08-03 DIAGNOSIS — Z95811 Presence of heart assist device: Secondary | ICD-10-CM | POA: Diagnosis not present

## 2015-08-03 DIAGNOSIS — I5022 Chronic systolic (congestive) heart failure: Secondary | ICD-10-CM | POA: Insufficient documentation

## 2015-08-03 LAB — PROTIME-INR
INR: 1.97 — ABNORMAL HIGH (ref 0.00–1.49)
Prothrombin Time: 22.3 seconds — ABNORMAL HIGH (ref 11.6–15.2)

## 2015-08-04 ENCOUNTER — Ambulatory Visit (HOSPITAL_COMMUNITY): Payer: Self-pay | Admitting: *Deleted

## 2015-08-04 NOTE — Progress Notes (Signed)
Wife called VAD pager to report "VAD driveline fault" alarm. VAD parameters unchanged, no pump stops, pt asymptomatic.  Instructed to silence VAD alarm (will silence for 4 hours) and come to clinic for driveline/controller evaluation.   Pt reports he was sitting, eating breakfast this am, attached to batteries experienced "driveline fault" alarm. No pump stops reported. Pt presented to clinic as instructed. VAD coordinator cleared driveline fault alarm and was unable to reproduce alarms with lead manipulation or pt positional changes on  Primary controller:  [phone].  Event log downloaded and sent to [facility] for analysis. Per Galen Daft, St Jude Engineer:  The 7.23 Pocket Controller Log File submitted for GM contained data from 07/18/2015 0:39 - 08/03/2015 12:05, ~ 16.5 days of history. A Driveline Fault Alarm was recorded on 08/03/2015 10:26. Motor Function was not affected by this event.  Upon analysis of this log file the drive line fault alarm appears to be the characteristic of an early driveline fault due to the sensitivity of the driveline fault detection circuitry.  It does not indicate a problem with the Percutaneous Lead.  It is recommended that you replace the Pocket Controller with a new Pocket Controller. Per our follow up conversation, you stated that you will replace the Pocket Controller and submit a new Log File for further evaluation before sending the patient home.  Primary controller [id number] replaced with [id number].  Pt did become "lightheaded" and reported near syncopal event with brief pump stop during controller change out. BP immediately after change out: 121/92 (103), HR 72, Speed 9200, Flow 4.2,  Power 5.1,  PI 6.9.    Data obtained on new controller, event log downloaded and sent to [facility] for review:  Per our conversation, the Log File data from the replacement Pocket Controller indicates there are no issues with the driveline. This history covered ~7 minutes of history.   You stated that you will send the patient home and have follow up Log Files submitted during his next scheduled routine clinic visit.  Pt feeling "fine" after controller change out. Instructed to call VAD pager if any further alarms/advisories. Pt and wife verbalized understanding of same.  Primary and back up controller programmed at fixed speed 9200 and low speed limit 8600.   Primary Controller [phone]:  Replace back up battery in 33 months  Back up controller [id number]: Replace back up battery in 12 months

## 2015-08-08 ENCOUNTER — Telehealth (HOSPITAL_COMMUNITY): Payer: Self-pay | Admitting: Infectious Diseases

## 2015-08-08 NOTE — Telephone Encounter (Signed)
Call received from [place] today regarding not feeling well. He states he has had increased gagging episodes, diarrhea that started Saturday and reports he "cannot keep anything down." He reports he is febrile according to his standards at 97.8 degrees. Denies any cough or sore throat or other respiratory symptoms. I offered for him to come to the ER for IVF and assessment--he deferred for now and will give it one more night. I will call him and check in tomorrow.

## 2015-08-11 ENCOUNTER — Ambulatory Visit (HOSPITAL_COMMUNITY): Payer: Medicare Other | Admitting: Infectious Diseases

## 2015-08-11 ENCOUNTER — Encounter (HOSPITAL_COMMUNITY): Payer: Medicare Other

## 2015-08-11 LAB — POCT INR: INR: 2

## 2015-08-16 ENCOUNTER — Ambulatory Visit: Payer: Self-pay | Admitting: *Deleted

## 2015-08-16 ENCOUNTER — Other Ambulatory Visit (HOSPITAL_COMMUNITY): Payer: Self-pay | Admitting: *Deleted

## 2015-08-16 ENCOUNTER — Ambulatory Visit (HOSPITAL_COMMUNITY)
Admission: RE | Admit: 2015-08-16 | Discharge: 2015-08-16 | Disposition: A | Discharge status: Home / Self Care | Payer: Medicare Other | Source: Ambulatory Visit | Attending: Adult Health | Admitting: Adult Health

## 2015-08-16 DIAGNOSIS — I2510 Atherosclerotic heart disease of native coronary artery without angina pectoris: Secondary | ICD-10-CM | POA: Diagnosis not present

## 2015-08-16 DIAGNOSIS — R0600 Dyspnea, unspecified: Secondary | ICD-10-CM

## 2015-08-16 DIAGNOSIS — Z95811 Presence of heart assist device: Secondary | ICD-10-CM

## 2015-08-16 DIAGNOSIS — I5022 Chronic systolic (congestive) heart failure: Secondary | ICD-10-CM | POA: Diagnosis not present

## 2015-08-16 DIAGNOSIS — E039 Hypothyroidism, unspecified: Secondary | ICD-10-CM | POA: Insufficient documentation

## 2015-08-16 DIAGNOSIS — I252 Old myocardial infarction: Secondary | ICD-10-CM | POA: Diagnosis not present

## 2015-08-16 DIAGNOSIS — I499 Cardiac arrhythmia, unspecified: Secondary | ICD-10-CM | POA: Diagnosis not present

## 2015-08-16 DIAGNOSIS — Z79899 Other long term (current) drug therapy: Secondary | ICD-10-CM | POA: Insufficient documentation

## 2015-08-16 DIAGNOSIS — Z8673 Personal history of transient ischemic attack (TIA), and cerebral infarction without residual deficits: Secondary | ICD-10-CM | POA: Insufficient documentation

## 2015-08-16 DIAGNOSIS — Z95828 Presence of other vascular implants and grafts: Secondary | ICD-10-CM | POA: Insufficient documentation

## 2015-08-16 DIAGNOSIS — R531 Weakness: Secondary | ICD-10-CM

## 2015-08-16 DIAGNOSIS — Z7901 Long term (current) use of anticoagulants: Secondary | ICD-10-CM | POA: Insufficient documentation

## 2015-08-16 DIAGNOSIS — N183 Chronic kidney disease, stage 3 (moderate): Secondary | ICD-10-CM | POA: Insufficient documentation

## 2015-08-16 DIAGNOSIS — I429 Cardiomyopathy, unspecified: Secondary | ICD-10-CM | POA: Insufficient documentation

## 2015-08-16 DIAGNOSIS — I129 Hypertensive chronic kidney disease with stage 1 through stage 4 chronic kidney disease, or unspecified chronic kidney disease: Secondary | ICD-10-CM | POA: Insufficient documentation

## 2015-08-16 DIAGNOSIS — E785 Hyperlipidemia, unspecified: Secondary | ICD-10-CM | POA: Diagnosis not present

## 2015-08-16 DIAGNOSIS — I480 Paroxysmal atrial fibrillation: Secondary | ICD-10-CM

## 2015-08-16 DIAGNOSIS — I255 Ischemic cardiomyopathy: Secondary | ICD-10-CM

## 2015-08-16 DIAGNOSIS — I498 Other specified cardiac arrhythmias: Secondary | ICD-10-CM

## 2015-08-16 DIAGNOSIS — E860 Dehydration: Secondary | ICD-10-CM | POA: Diagnosis not present

## 2015-08-16 LAB — PROTIME-INR
INR: 1.67 — ABNORMAL HIGH (ref 0.00–1.49)
Prothrombin Time: 19.7 seconds — ABNORMAL HIGH (ref 11.6–15.2)

## 2015-08-16 LAB — CBC
HEMATOCRIT: 38.1 % — AB (ref 39.0–52.0)
Hemoglobin: 12.2 g/dL — ABNORMAL LOW (ref 13.0–17.0)
MCH: 30.2 pg (ref 26.0–34.0)
MCHC: 32 g/dL (ref 30.0–36.0)
MCV: 94.3 fL (ref 78.0–100.0)
Platelets: 150 10*3/uL (ref 150–400)
RBC: 4.04 MIL/uL — ABNORMAL LOW (ref 4.22–5.81)
RDW: 15.7 % — AB (ref 11.5–15.5)
WBC: 6.5 10*3/uL (ref 4.0–10.5)

## 2015-08-16 LAB — BASIC METABOLIC PANEL
Anion gap: 11 (ref 5–15)
BUN: 20 mg/dL (ref 6–20)
CHLORIDE: 97 mmol/L — AB (ref 101–111)
CO2: 26 mmol/L (ref 22–32)
Calcium: 8.9 mg/dL (ref 8.9–10.3)
Creatinine, Ser: 1.46 mg/dL — ABNORMAL HIGH (ref 0.61–1.24)
GFR calc Af Amer: 52 mL/min — ABNORMAL LOW (ref >60)
GFR calc non Af Amer: 45 mL/min — ABNORMAL LOW (ref >60)
GLUCOSE: 104 mg/dL — AB (ref 65–99)
POTASSIUM: 4.4 mmol/L (ref 3.5–5.1)
Sodium: 134 mmol/L — ABNORMAL LOW (ref 135–145)

## 2015-08-16 LAB — BRAIN NATRIURETIC PEPTIDE: B NATRIURETIC PEPTIDE 5: 305.5 pg/mL — AB (ref 0.0–100.0)

## 2015-08-16 LAB — LACTATE DEHYDROGENASE: LDH: 264 U/L — ABNORMAL HIGH (ref 98–192)

## 2015-08-16 NOTE — Progress Notes (Addendum)
Presents today with [name] (significant other) for sick visit with complaints of tremors and wobbling.   Symptom  Yes  No  Details   Angina       x Activity:   Claudication        x How far:   Syncope        x When:     Stroke        x        TIA  2001  Orthopnea         x How many pillows: 1  PND         x How often:  CPAP       N/A How many hrs:   Pedal edema        x        R>L baseline + injury.   Abd fullness               x    N&V               x  "gagging episodes" resolved with gabapentin  Diaphoresis               x   Bleeding              x   Urine color        yellow  SOB        x      Activity: incline, no more than normal.   Palpitations        x When:   ICD shock        x   Hospitlizaitons                 x When/where/why:   ED visit        x When/where/why:  Other MD        x       When/who/why:  06/17/15 - PCP for right lower leg edema/redness  Activity        Off work due to injuries  Fluid    No limitations . Tries to drink the same amount of fluid every day.   Diet    Low salt food   Vital signs: HR: 70 MAP BP:  80 Auto cuff:  71/44 (57) O2 Sat:  97 Wt:  192.8  lbs Last wt: 199.8 lbs Ht: 6'0"  LVAD interrogation reveals:  Speed:   9200  Flow: 3.6 Power: 4.6 PI:  6.5 Alarms: 2 low flow today; 1 low flow 10/10; 1 low flow 08/12/15 Events:  PI events:  10/10 - 50  10/9 -40  10/8 - 30  10/7 - 25  10/6 - 45 Fixed speed:  9200  Low speed limit:  8600 Primary Controller:  Replace back up battery in 31 months  (Feb 2017) Back up controller:   Replace back up battery in 12 months (Feb 2017)     HR BP  Flow Speed  PI Power Sitting  70 66/40 (50) --- 9200  2.9 4.3  Lying  74 119/69 (85) 3.5 9200  6.9 4.7  Standing 70 103/74 (82) --- 9200  3.3 4.3   After 1 liter NS:  Lying  70 109/87 (93) 4.4 9200  7.6 5.1  Sitting  74 103/82 (90) 3.8 9200  6.4 4.9   Standing 70 96/74 (85) 3.6 9200  6.2 4.8   Patient Instructions: 1.  Stop amlodipine  (Norvasc). 2.  Take warfarin 4  mg today then increase to 2 mg daily except 4 mg on Mon/Thurs. Re-check INR on Thursday.  3.  Return for nurse visit next Thursday 08/25/15 for VAD parameter and BP check with labs. 4.  Increase food and fluid intake.   Frank Girt, RN VAD Coordinator  Office: [phone] 24/7 VAD Pager: [phone]  VAD CLINIC PROGRESS NOTE   PCP: [name] ([place])  [name] is a 74 yo male with h/o VT, PAD and severe systolic HF (EF 36-14%) due to mixed ischemic/nonischemic CM he is s/p HM II LVAD implant for destination therapy on October 19, 2013. On 09/01/14 underwent cystoscopy and flexible ureteroscopy with lithotripsy and basket extraction.   VAD implantation was complicated by VT, syncope, A fib/Aflutter and persistent low output felt to be due to RHF. RHC showed low cardiac output but no evidence of RV failure on Milrinone. He was felt to be volume depleted. He was discharged home on 11/18/13 on mirlinone and PRN lasix 20 mg for a weight of 200 pounds or greater. Milrinone has since been weaned off.   RHC (07/13/14): Well compensated hemodynamics with VAD support.  Admitted 11/9-11/19/15 for hematuria and chronic nephrolithiasis. Underwent Echo to assess RV for concern RV failure because he presented with SOB. RV looked good and had renal US showed mild hyronephrosis and bilateral renal cysts. Dr. Risa [name] took back for repeat lithotripsy and stone extraction by ureteroscopy and J stent placement. Received total of 3 PRBCs. D/C weight 188 lbs.   In 4/16 admitted with weakness and fall. In ER hemoglobin was found to be 5.4 with MAP 78.  While hospitalized he received 5U PRBCs. 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 Then readmitted with acute L4 compression fracture. Most recently admitted with weakness and low PI. Found to have recurrent atrial tach and underwent TEE/DCC on 03/02/15  Follow up LVAD:  Wife called  VAD pager this am to report low flow alarms when pt got up to go to bathroom. Pt was asymptomatic and denied dizziness or lightheadedness. PI returned to normal after drinking fluid.   Reports to VAD clinic and denies any symptoms. MAP low, increased # of PI events over last few days and multiple isolated low flows. Pt reports 10 lb wt loss over last few weeks following viral illness with nausea, vomiting, diarrhea and loss of appetite. Wt slowly trending back up, but admits to not drinking fluid yesterday. Optivol obtained, shows fluid index well below threshold.   While sitting on side of bed, PI dropped to 2.8 along with flow and BP drop. Pt denied symptoms; stat EKG obtained showing NSR.  Vitals re-checked with pt standing and pt did become dizzy with standing; resolved with lying down. Hemoglobin was normal today.   Labs (10/16): Hgb 12.2  ECG: NSR, 1st degree AV block, IVCD   Past Medical History  Diagnosis Date  . Bradycardia   . Ventricular tachycardia     s/p AICD;   h/o ICD shock;  Amiodarone Rx. S/P ablation in 2012  . 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 HTN (hypertension)   . HLD (hyperlipidemia)   . Cytopenia   . Hypothyroidism   . Renal insufficiency   . Claudication   . Chronic systolic  heart failure   . Ischemic cardiomyopathy     echo 7/12:  EF 25-30%, mild AI, mod MR, mod LAE  . TIA (transient ischemic attack) 2001  . CVD (cerebrovascular disease)     left CEA 2008  . Stroke   . Anemia   . Inappropriate therapy from implantable cardioverter-defibrillator 04/02/2013    ATP for sinus tach  SVT wavelet identified SVT but was no  passive   . Myocardial infarction 1992  . Anginal pain   . Automatic implantable cardioverter-defibrillator in situ     [employer] Evera device serial number [id number]    . Arthritis     "a little in my  knees" (05/25/2013)  . Melanoma of ear 2013    "near left ear" (05/25/2013)  . 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 Dysrhythmia     HX A FIB  . History of nonmelanoma skin cancer     L EAR  . Kidney stone   . History of transfusion   . Sleep apnea     denies wearing mask (05/25/2013)  . CAD (coronary artery disease)     s/p MI 1982;  s/p DES to CFX 2011;  s/p NSTEMI 4/12 in setting of VTach;  cath 7/12:  LM ok, LAD mid 30-40%, Dx's with 40%, mCFX stent patent, prox to mid RCA occluded with R to R and L to R collats.  Medical Tx was continued  . CHF (congestive heart failure)     HAS VENTRICULAR PUMP    Current Outpatient Prescriptions  Medication Sig Dispense Refill  . atorvastatin (LIPITOR) 80 MG tablet Take 80 mg by mouth daily.     [name] docusate sodium (COLACE) 100 MG capsule Take 200 mg by mouth daily. Taking every other day    . ferrous gluconate (FERGON) 324 MG tablet Take 1 tablet (324 mg total) by mouth 2 (two) times daily with a meal. 60 tablet 6  . levothyroxine (SYNTHROID, LEVOTHROID) 200 MCG tablet Take one 200 mcg daily before breakfast every day with 1 1/2 (300 mcg) every Monday. 30 tablet 6  . magnesium gluconate (MAGONATE) 500 MG tablet Take 1,000 mg by mouth at bedtime.     . Multiple Vitamin (MULTIVITAMIN WITH MINERALS) TABS tablet Take 1 tablet by mouth at bedtime.    . pantoprazole (PROTONIX) 40 MG tablet Take 1 tablet (40 mg total) by mouth 2 (two) times daily. 60 tablet 6  . RANEXA 500 MG 12 hr tablet TAKE 1 TABLET (500 MG TOTAL) BY MOUTH 2 (TWO) TIMES DAILY. 60 tablet 6  . warfarin (COUMADIN) 2 MG tablet As directed 50 tablet 6  . zinc gluconate 50 MG tablet Take 100 mg by mouth at bedtime.     [name] acetaminophen (TYLENOL) 325 MG tablet Take 650 mg by mouth every 6 (six) hours as needed (pain).    . furosemide (LASIX) 20 MG tablet Take 20 mg by mouth daily as needed for fluid.      No current facility-administered medications for this encounter.     Demerol  REVIEW OF SYSTEMS: All systems negative except as listed in HPI, PMH and Problem list.  LVAD interrogation reveals:  See LVAD nurse's note above  I reviewed the LVAD parameters from today, and compared the results to the patient's prior recorded data.  Increased set speed to 9200 and back-up speed to 8600. The LVAD is functioning within specified parameters.  The patient performs LVAD self-test daily.  LVAD interrogation was negative  for any significant power changes, alarms or PI events/speed drops.  LVAD equipment check completed and is in good working order.  Back-up equipment present.   LVAD education done on emergency procedures and precautions and reviewed exit site care.   Vital signs: HR: 70 MAP BP:  80 Auto cuff:  71/44 (57) O2 Sat:  97 Wt:  192.8  lbs Last wt: 199.8 lbs Ht: 6'0"  Physical Exam: GENERAL: NAD HEENT: normal NECK: Supple, no JVD, no carotid bruits.  No lymphadenopathy or thyromegaly appreciated.   CARDIAC:  Mechanical heart sounds with LVAD hum present.  LUNGS:  clear.  ABDOMEN:  Soft, round, nontender, positive bowel sounds x4.     LVAD exit site: well-healed and incorporated.  Dressing dry and intact.  No erythema or drainage.  Stabilization device present and accurately applied.  Driveline dressing is being changed weekly per sterile technique. EXTREMITIES:  Warm and dry, no cyanosis, clubbing, no edema.  NEUROLOGIC:  Alert and oriented x 4.  Gait steady.  No aphasia.  No dysarthria.  Affect pleasant. FTN ok. No dysdiadochokinesis     ASSESSMENT AND PLAN:  1. Chronic systolic HF: s/p LVAD 00/4599.  He appears dehydrated today with orthostasis.  This is in the setting of recent GI illness with nausea/vomiting/diarrhea.  He does not use Lasix.  - We will give 1 L IV fluid in the clinic and encourage po hydration at home.  - Stop amlodipine with low MAP.  3. Atrial arrhythmias: Atrial fibrillation, atrial tachycardia.  s/p DC-CV 06/04/14 and  repeat DC-CV 03/02/15.  Now off amio due to thyrotoxicity. Watch closely for recurrent AF 4 HTN: MAP low. Stop amlodipine.  5. LVAD: LVAD parameters reviewed personally. PI lower, low flow alarms.  Think due to dehydration in setting of viral illness, as above.  6.CKD, stage III: BMET today.  7. Anticoagulation management: Goal INR 2-2. INR today pending.  8. S/P Renal Stone lithotripsy: Per Dr [name]. 9. 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 Hemoglobin 12.2 today with no signs of acute bleeding.   Follow up in 1 week.   Loralie Champagne MD 08/16/2015

## 2015-08-16 NOTE — Patient Instructions (Signed)
1.  Stop amlodipine (Norvasc). 2.  Take warfarin 4 mg today then increase to 2 mg daily except 4 mg on Mon/Thurs. Re-check INR on Thursday.  3.  Return for nurse visit next Thursday 08/25/15 for VAD parameter and BP check with labs. 4.  Increase food and fluid intake.

## 2015-08-16 NOTE — Addendum Note (Signed)
Encounter addended by: Larey Dresser, MD on: 08/16/2015 10:36 PM<BR>     Documentation filed: Charges VN

## 2015-08-18 ENCOUNTER — Encounter (HOSPITAL_COMMUNITY): Payer: Medicare Other

## 2015-08-19 ENCOUNTER — Other Ambulatory Visit (HOSPITAL_COMMUNITY): Payer: Self-pay | Admitting: Infectious Diseases

## 2015-08-19 ENCOUNTER — Ambulatory Visit (HOSPITAL_COMMUNITY): Payer: Medicare Other | Admitting: Infectious Diseases

## 2015-08-19 DIAGNOSIS — Z7901 Long term (current) use of anticoagulants: Secondary | ICD-10-CM

## 2015-08-19 DIAGNOSIS — Z5181 Encounter for therapeutic drug level monitoring: Secondary | ICD-10-CM

## 2015-08-19 LAB — POCT INR: INR: 2.3

## 2015-08-25 ENCOUNTER — Other Ambulatory Visit (HOSPITAL_COMMUNITY): Payer: Medicare Other

## 2015-08-26 ENCOUNTER — Other Ambulatory Visit (HOSPITAL_COMMUNITY): Payer: Self-pay | Admitting: *Deleted

## 2015-08-26 ENCOUNTER — Ambulatory Visit (HOSPITAL_COMMUNITY): Payer: Self-pay | Admitting: *Deleted

## 2015-08-26 ENCOUNTER — Ambulatory Visit (HOSPITAL_COMMUNITY)
Admission: RE | Admit: 2015-08-26 | Discharge: 2015-08-26 | Disposition: A | Discharge status: Home / Self Care | Payer: Medicare Other | Source: Ambulatory Visit | Attending: Internal Medicine | Admitting: Internal Medicine

## 2015-08-26 VITALS — BP 100/73 | HR 70 | Ht 72.0 in | Wt 193.8 lb

## 2015-08-26 DIAGNOSIS — I440 Atrioventricular block, first degree: Secondary | ICD-10-CM | POA: Insufficient documentation

## 2015-08-26 DIAGNOSIS — I129 Hypertensive chronic kidney disease with stage 1 through stage 4 chronic kidney disease, or unspecified chronic kidney disease: Secondary | ICD-10-CM | POA: Diagnosis not present

## 2015-08-26 DIAGNOSIS — E785 Hyperlipidemia, unspecified: Secondary | ICD-10-CM | POA: Insufficient documentation

## 2015-08-26 DIAGNOSIS — Z87442 Personal history of urinary calculi: Secondary | ICD-10-CM | POA: Diagnosis not present

## 2015-08-26 DIAGNOSIS — Z8673 Personal history of transient ischemic attack (TIA), and cerebral infarction without residual deficits: Secondary | ICD-10-CM | POA: Insufficient documentation

## 2015-08-26 DIAGNOSIS — I2510 Atherosclerotic heart disease of native coronary artery without angina pectoris: Secondary | ICD-10-CM | POA: Insufficient documentation

## 2015-08-26 DIAGNOSIS — D649 Anemia, unspecified: Secondary | ICD-10-CM

## 2015-08-26 DIAGNOSIS — Z79899 Other long term (current) drug therapy: Secondary | ICD-10-CM | POA: Insufficient documentation

## 2015-08-26 DIAGNOSIS — N183 Chronic kidney disease, stage 3 (moderate): Secondary | ICD-10-CM | POA: Insufficient documentation

## 2015-08-26 DIAGNOSIS — Z85820 Personal history of malignant melanoma of skin: Secondary | ICD-10-CM | POA: Insufficient documentation

## 2015-08-26 DIAGNOSIS — I5022 Chronic systolic (congestive) heart failure: Secondary | ICD-10-CM | POA: Diagnosis not present

## 2015-08-26 DIAGNOSIS — Z7901 Long term (current) use of anticoagulants: Secondary | ICD-10-CM | POA: Diagnosis not present

## 2015-08-26 DIAGNOSIS — I739 Peripheral vascular disease, unspecified: Secondary | ICD-10-CM | POA: Insufficient documentation

## 2015-08-26 DIAGNOSIS — I255 Ischemic cardiomyopathy: Secondary | ICD-10-CM | POA: Diagnosis not present

## 2015-08-26 DIAGNOSIS — R6881 Early satiety: Secondary | ICD-10-CM | POA: Diagnosis not present

## 2015-08-26 DIAGNOSIS — G4730 Sleep apnea, unspecified: Secondary | ICD-10-CM | POA: Diagnosis not present

## 2015-08-26 DIAGNOSIS — I252 Old myocardial infarction: Secondary | ICD-10-CM | POA: Insufficient documentation

## 2015-08-26 DIAGNOSIS — I471 Supraventricular tachycardia: Secondary | ICD-10-CM

## 2015-08-26 DIAGNOSIS — Z85828 Personal history of other malignant neoplasm of skin: Secondary | ICD-10-CM | POA: Insufficient documentation

## 2015-08-26 DIAGNOSIS — E039 Hypothyroidism, unspecified: Secondary | ICD-10-CM | POA: Diagnosis not present

## 2015-08-26 DIAGNOSIS — Z95811 Presence of heart assist device: Secondary | ICD-10-CM | POA: Diagnosis not present

## 2015-08-26 DIAGNOSIS — Z955 Presence of coronary angioplasty implant and graft: Secondary | ICD-10-CM | POA: Insufficient documentation

## 2015-08-26 LAB — CBC
HCT: 39 % (ref 39.0–52.0)
HEMOGLOBIN: 12.7 g/dL — AB (ref 13.0–17.0)
MCH: 30.6 pg (ref 26.0–34.0)
MCHC: 32.6 g/dL (ref 30.0–36.0)
MCV: 94 fL (ref 78.0–100.0)
Platelets: 145 10*3/uL — ABNORMAL LOW (ref 150–400)
RBC: 4.15 MIL/uL — ABNORMAL LOW (ref 4.22–5.81)
RDW: 15.6 % — AB (ref 11.5–15.5)
WBC: 6.4 10*3/uL (ref 4.0–10.5)

## 2015-08-26 LAB — PROTIME-INR
INR: 1.75 — ABNORMAL HIGH (ref 0.00–1.49)
PROTHROMBIN TIME: 20.4 s — AB (ref 11.6–15.2)

## 2015-08-26 NOTE — Addendum Note (Signed)
Encounter addended by: Lezlie Octave, RN on: 08/26/2015  1:05 PM<BR>     Documentation filed: Notes Section

## 2015-08-26 NOTE — Addendum Note (Signed)
Encounter addended by: Larey Dresser, MD on: 08/26/2015 11:13 AM<BR>     Documentation filed: Problem List, Visit Diagnoses, Follow-up Section, LOS Section, Charges VN, Notes Section

## 2015-08-26 NOTE — Progress Notes (Addendum)
Pt here with wife for BP and VAD parameter check.    Symptom  Yes  No  Details   Angina       x Activity:   Claudication        x How far:   Syncope        x When:     Stroke        x        TIA  2001  Orthopnea         x How many pillows: 1  PND         x How often:  CPAP       N/A How many hrs:   Pedal edema        x        R>L baseline   Abd fullness               x    N&V               x Gagging episodes resolved (stopped gabapentin and Baclofen; fair appetite; early satiety   Diaphoresis        x        3 - 4 x week; "feeling hot"with diaphoresis  Bleeding              x   Urine color        yellow  SOB        x      Activity: incline, no more than normal.   Palpitations        x When:   ICD shock        x   Hospitlizaitons                 x When/where/why:   ED visit        x When/where/why:  Other MD              x When/who/why:    Activity        Off work due to injuries; "written out until furhter notice"  Fluid    No limitations (36 ozs day)  Diet    Low salt food   Vital signs: HR: 70 MAP BP: 70 Auto cuff:  100/73 (83) O2 Sat:  98 Wt:  193.8   lbs Last wt: 192.8  Lbs Home wts:  184 - 186 - 185 lbs Ht: 6'0"  LVAD interrogation reveals:  Speed:   9200  Flow: --- Power: 4.4 PI: 2.8 Alarms:  4 low flow alarms Events: 25 - 45 PI events daily Fixed speed:  9200  Low speed limit:  8600 Primary Controller:  Replace back up battery in 13 months  (Feb 2017) Back up controller:   Replace back up battery in 13 months (Feb 2017)  I reviewed the LVAD parameters from today and compared the results to the patient's prior recorded data.  LVAD interrogation revealed 25 - 45 PI events daily with a total of 3 low flows (2 on 10/17 and 1 on 08/21/15). Pt denies any symptoms of dizziness, lightheadedness, or syncope. Reports he feels better over last week other than chronic back pain.  No programming changes were made and pump is functioning within specified parameters.   VAD  coordinator reviewed daily log from home for daily temperature, weight, and VAD parameters. Pt is performing daily controller and system monitor self tests along with completing weekly and monthly maintenance for LVAD equipment.  LVAD equipment check completed and is in good working order. Back-up equipment present. LVAD education done on emergency procedures and precautions and reviewed exit site care.   Orthostatic BP and VAD parameters:  Pt denied any symptoms of dizziness, lightheadedness, or weakness during the changes.    Doppler: Auto cuff:  Flow  Speed  PI  Power  Baseline 70  100/73 (83)  ---  9200  2.8  4.4 Standing   87/70 (77)  ---  9200  2.2  4.2  Repeated: Sitting    Unable to obtain ---  9200  1.9  4.3 Lying    92/73 (80)  3.4  9200  4.5  4.6  Repeated: Lying    90/72 (78)  3.2  9200  7.0  4.5 Sitting    84/68 (72)  ---  9200  2.4  4.2  Optivol and EKG obtained; Tomi Bamberger from [employer] here for full ICD interrogation. Dr. Aundra Dubin reviewed all.   Pt reports appetite is fair with early satiety for last 3 - 4 months.  Contributed wt loss and decreased appetite to frequent gagging episodes, but the gagging episodes have resolved since stopping gabapentin and Baclofen.  Pt denies any increased SOB, weakness, fatigue, dizziness, or syncope.  Only complaint today is chronic back pain.   Spoke with Bonnita Nasuti, PharmD re: INR results today and the addition of daily Ensure; see anticoag note.    Zada Girt, RN VAD Coordinator  Office: [phone] 24/7 VAD Pager: [phone]

## 2015-08-26 NOTE — Progress Notes (Signed)
Patient ID: [name], male   DOB: December 12, 1940, 74 y.o.   MRN: [id number]  VAD CLINIC PROGRESS NOTE  PCP: Dr. Karolee Stamps ([place])  [name] is a 74 yo male with h/o VT, PAD and severe systolic HF (EF 23-53%) due to mixed ischemic/nonischemic CM he is s/p HM II LVAD implant for destination therapy on October 19, 2013. On 09/01/14 underwent cystoscopy and flexible ureteroscopy with lithotripsy and basket extraction.   VAD implantation was complicated by VT, syncope, A fib/Aflutter and persistent low output felt to be due to RHF. RHC showed low cardiac output but no evidence of RV failure on Milrinone. He was felt to be volume depleted. He was discharged home on 11/18/13 on mirlinone and PRN lasix 20 mg for a weight of 200 pounds or greater. Milrinone has since been weaned off.   RHC (07/13/14): Well compensated hemodynamics with VAD support.  Admitted 11/9-11/19/15 for hematuria and chronic nephrolithiasis. Underwent Echo to assess RV for concern RV failure because he presented with SOB. RV looked good and had renal US showed mild hyronephrosis and bilateral renal cysts. Dr. Risa Grill took back for repeat lithotripsy and stone extraction by ureteroscopy and J stent placement. Received total of 3 PRBCs. D/C weight 188 lbs.   In 4/16 admitted with weakness and fall. In ER hemoglobin was found to be 5.4 with MAP 78.  While hospitalized he received 5U PRBCs. 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 Then readmitted with acute L4 compression fracture. Most recently admitted with weakness and low PI. Found to have recurrent atrial tach and underwent TEE/DCC on 03/02/15  Follow up LVAD:  At last visit, PI noted to be low and to fall with standing.  He was given IV fluid at that time.  He had had a GI illness (sounds like viral gastroenteritis) a couple of weeks prior to this and had lost considerable weight.  We stopped amlodipine as well.  He has not  been taking Lasix.   Today, he returns for followup.  Feels great.  Walking 1/2 mile/day without dyspnea.  No orthostatic symptoms.  However, still having a lot of PI events and having low flow events.  PI drops with standing.  However, he is not symptomatic.  He is in NSR today.  Hemoglobin is stable.  His weight is stable.  We interrogated his device: he has been in NSR or has been a-paced with no evident arrhythmias.  Back-up a-pacing takes over at a rate of 70 bpm. He continues to complain of early satiety but does not have dysphagia.  No vomiting.  No abdominal pain.   Optivol checked: Fluid index < threshold, impedance stable.    Labs (10/16): Hgb 12.2 => 12.7, BNP 305, K 4.4, creatinine 1.46, LDL 264  ECG: NSR, 1st degree AV block, IVCD   Past Medical History  Diagnosis Date  . Bradycardia   . Ventricular tachycardia     s/p AICD;   h/o ICD shock;  Amiodarone Rx. S/P ablation in 2012  . 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 HTN (hypertension)   . HLD (hyperlipidemia)   . Cytopenia   . Hypothyroidism   . Renal insufficiency   . Claudication   . Chronic systolic heart failure   .  Ischemic cardiomyopathy     echo 7/12:  EF 25-30%, mild AI, mod MR, mod LAE  . TIA (transient ischemic attack) 2001  . CVD (cerebrovascular disease)     left CEA 2008  . Stroke   . Anemia   . Inappropriate therapy from implantable cardioverter-defibrillator 04/02/2013    ATP for sinus tach  SVT wavelet identified SVT but was no  passive   . Myocardial infarction 1992  . Anginal pain   . Automatic implantable cardioverter-defibrillator in situ     [employer] Evera device serial number [id number]    . Arthritis     "a little in my knees" (05/25/2013)  . Melanoma of ear 2013    "near left ear" (05/25/2013)  . 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 Dysrhythmia     HX  A FIB  . History of nonmelanoma skin cancer     L EAR  . Kidney stone   . History of transfusion   . Sleep apnea     denies wearing mask (05/25/2013)  . CAD (coronary artery disease)     s/p MI 1982;  s/p DES to CFX 2011;  s/p NSTEMI 4/12 in setting of VTach;  cath 7/12:  LM ok, LAD mid 30-40%, Dx's with 40%, mCFX stent patent, prox to mid RCA occluded with R to R and L to R collats.  Medical Tx was continued  . CHF (congestive heart failure)     HAS VENTRICULAR PUMP    Current Outpatient Prescriptions  Medication Sig Dispense Refill  . acetaminophen (TYLENOL) 325 MG tablet Take 650 mg by mouth every 6 (six) hours as needed (pain).    [name] atorvastatin (LIPITOR) 80 MG tablet Take 80 mg by mouth daily.     . ferrous gluconate (FERGON) 324 MG tablet Take 1 tablet (324 mg total) by mouth 2 (two) times daily with a meal. 60 tablet 6  . levothyroxine (SYNTHROID, LEVOTHROID) 200 MCG tablet Take one 200 mcg daily before breakfast every day with 1 1/2 (300 mcg) every Monday. 30 tablet 6  . magnesium gluconate (MAGONATE) 500 MG tablet Take 1,000 mg by mouth at bedtime.     . Multiple Vitamin (MULTIVITAMIN WITH MINERALS) TABS tablet Take 1 tablet by mouth at bedtime.    . pantoprazole (PROTONIX) 40 MG tablet Take 1 tablet (40 mg total) by mouth 2 (two) times daily. 60 tablet 6  . RANEXA 500 MG 12 hr tablet TAKE 1 TABLET (500 MG TOTAL) BY MOUTH 2 (TWO) TIMES DAILY. 60 tablet 6  . warfarin (COUMADIN) 2 MG tablet As directed 50 tablet 6  . zinc gluconate 50 MG tablet Take 100 mg by mouth at bedtime.     . docusate sodium (COLACE) 100 MG capsule Take 200 mg by mouth daily. Taking every other day    . furosemide (LASIX) 20 MG tablet Take 20 mg by mouth daily as needed for fluid.      No current facility-administered medications for this encounter.    Demerol  REVIEW OF SYSTEMS: All systems negative except as listed in HPI, PMH and Problem list.  LVAD interrogation reveals:  Speed:   9200  Flow:  --- Power: 4.4 PI: 2.8 Alarms:  4 low flow alarms Events: 25 - 45 PI events daily Fixed speed:  9200  Low speed limit:  8600 Primary Controller:  Replace back up battery in 13 months  (Feb 2017) Back up controller:   Replace back up battery in 13  months (Feb 2017)  I reviewed the LVAD parameters from today, and compared the results to the patient's prior recorded data.  Increased set speed to 9200 and back-up speed to 8600. The LVAD is functioning within specified parameters.  The patient performs LVAD self-test daily.  LVAD interrogation was negative for any significant power changes, alarms or PI events/speed drops.  LVAD equipment check completed and is in good working order.  Back-up equipment present.   LVAD education done on emergency procedures and precautions and reviewed exit site care.   Vital signs: HR: 70 MAP BP: 70 Auto cuff:  100/73 (83) O2 Sat:  98 Wt:  193.8   lbs Last wt: 192.8  Lbs Home wts:  184 - 186 - 185 lbs Ht: 6'0"  Physical Exam: GENERAL: NAD HEENT: normal NECK: Supple, no JVD, no carotid bruits.  No lymphadenopathy or thyromegaly appreciated.   CARDIAC:  Mechanical heart sounds with LVAD hum present.  LUNGS:  clear.  ABDOMEN:  Soft, round, nontender, positive bowel sounds x4.     LVAD exit site: well-healed and incorporated.  Dressing dry and intact.  No erythema or drainage.  Stabilization device present and accurately applied.  Driveline dressing is being changed weekly per sterile technique. EXTREMITIES:  Warm and dry, no cyanosis, clubbing, no edema.  NEUROLOGIC:  Alert and oriented x 4.  Gait steady.  No aphasia.  No dysarthria.  Affect pleasant. FTN ok. No dysdiadochokinesis    ASSESSMENT AND PLAN:  1. Chronic systolic HF: s/p LVAD 16/9678.  Occasional low flow events and PI noted to drop with standing.  He does not appear volume overloaded by exam or Optivol.  However, he also does not appear particularly dehydrated.  Device interrogation shows no  bradycardic or tachycardic events.  Hemoglobin is stable.  LDH is stable.  We looked at his CXR, he does have a relatively small LV and it may be that this is position, with change in PI with change in pump position with standing and bending over.  I think that we do need to expand his volume/LV preload to try to limit positional PI changes.  He is asymptomatic.  - We encouraged better po hydration.  He has not been using Lasix.  - For general nutrition (and given early satiety), he will start daily Ensure/Boost.  - Stay off amlodipine. - Will do ramp echo when he returns in 2 weeks, hopefully in setting of better hydration.    3. Atrial arrhythmias: Atrial fibrillation, atrial tachycardia.  s/p DC-CV 06/04/14 and repeat DC-CV 03/02/15.  Now off amio due to thyrotoxicity. Watch closely for recurrent AF, none seen on device interrogation.  4 HTN: MAP still on the lower side.  Encouraged hydration, stay off amlodipine.  5. LVAD: LVAD parameters reviewed personally. PI lower, low flow alarms.  Think due to dehydration in setting of viral illness, as above.  6.CKD, stage III: BMET today.  7. Anticoagulation management: Goal INR 2-2.5. He is off ASA with GI bleeding.  8. S/P Renal Stone lithotripsy: Per Dr Risa Grill. 9. 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 Hemoglobin 12.7 today with no signs of acute bleeding.   Follow up in 2 wks.    Loralie Champagne MD 08/26/2015

## 2015-08-26 NOTE — Addendum Note (Signed)
Encounter addended by: Lezlie Octave, RN on: 08/26/2015 10:16 AM<BR>     Documentation filed: Visit Diagnoses, Dx Association, Notes Section, Medications, Vitals Section, Orders

## 2015-08-26 NOTE — Addendum Note (Signed)
Encounter addended by: Lezlie Octave, RN on: 08/26/2015  2:42 PM<BR>     Documentation filed: Notes Section

## 2015-08-31 ENCOUNTER — Ambulatory Visit (HOSPITAL_COMMUNITY): Payer: Medicare Other | Admitting: *Deleted

## 2015-08-31 LAB — POCT INR: INR: 2.1

## 2015-09-02 ENCOUNTER — Other Ambulatory Visit (HOSPITAL_COMMUNITY): Payer: Self-pay | Admitting: Infectious Diseases

## 2015-09-02 DIAGNOSIS — E876 Hypokalemia: Secondary | ICD-10-CM

## 2015-09-02 DIAGNOSIS — Z7901 Long term (current) use of anticoagulants: Secondary | ICD-10-CM

## 2015-09-02 DIAGNOSIS — R791 Abnormal coagulation profile: Secondary | ICD-10-CM

## 2015-09-02 DIAGNOSIS — T502X5A Adverse effect of carbonic-anhydrase inhibitors, benzothiadiazides and other diuretics, initial encounter: Secondary | ICD-10-CM

## 2015-09-08 ENCOUNTER — Encounter (HOSPITAL_COMMUNITY): Payer: Medicare Other

## 2015-09-08 ENCOUNTER — Ambulatory Visit (HOSPITAL_COMMUNITY)
Admission: RE | Admit: 2015-09-08 | Discharge: 2015-09-08 | Disposition: A | Discharge status: Home / Self Care | Payer: Medicare Other | Source: Ambulatory Visit | Attending: Cardiology | Admitting: Cardiology

## 2015-09-08 ENCOUNTER — Other Ambulatory Visit (HOSPITAL_COMMUNITY): Payer: Self-pay | Admitting: Infectious Diseases

## 2015-09-08 ENCOUNTER — Ambulatory Visit (HOSPITAL_BASED_OUTPATIENT_CLINIC_OR_DEPARTMENT_OTHER)
Admission: RE | Admit: 2015-09-08 | Discharge: 2015-09-08 | Disposition: A | Discharge status: Home / Self Care | Payer: Medicare Other | Source: Ambulatory Visit | Attending: Internal Medicine | Admitting: Internal Medicine

## 2015-09-08 VITALS — BP 125/84 | HR 70 | Wt 196.6 lb

## 2015-09-08 DIAGNOSIS — Z7901 Long term (current) use of anticoagulants: Secondary | ICD-10-CM | POA: Diagnosis not present

## 2015-09-08 DIAGNOSIS — I5022 Chronic systolic (congestive) heart failure: Secondary | ICD-10-CM | POA: Diagnosis not present

## 2015-09-08 DIAGNOSIS — I480 Paroxysmal atrial fibrillation: Secondary | ICD-10-CM

## 2015-09-08 DIAGNOSIS — Z95811 Presence of heart assist device: Secondary | ICD-10-CM

## 2015-09-08 DIAGNOSIS — R791 Abnormal coagulation profile: Secondary | ICD-10-CM

## 2015-09-08 DIAGNOSIS — T502X5A Adverse effect of carbonic-anhydrase inhibitors, benzothiadiazides and other diuretics, initial encounter: Secondary | ICD-10-CM

## 2015-09-08 DIAGNOSIS — E876 Hypokalemia: Secondary | ICD-10-CM

## 2015-09-08 LAB — BASIC METABOLIC PANEL WITH GFR
Anion gap: 6 (ref 5–15)
BUN: 22 mg/dL — ABNORMAL HIGH (ref 6–20)
CO2: 28 mmol/L (ref 22–32)
Calcium: 9.2 mg/dL (ref 8.9–10.3)
Chloride: 106 mmol/L (ref 101–111)
Creatinine, Ser: 1.47 mg/dL — ABNORMAL HIGH (ref 0.61–1.24)
GFR calc Af Amer: 52 mL/min — ABNORMAL LOW
GFR calc non Af Amer: 45 mL/min — ABNORMAL LOW
Glucose, Bld: 98 mg/dL (ref 65–99)
Potassium: 4.2 mmol/L (ref 3.5–5.1)
Sodium: 140 mmol/L (ref 135–145)

## 2015-09-08 LAB — PROTIME-INR
INR: 2.63 — AB (ref 0.00–1.49)
PROTHROMBIN TIME: 27.8 s — AB (ref 11.6–15.2)

## 2015-09-08 LAB — LACTATE DEHYDROGENASE: LDH: 279 U/L — AB (ref 98–192)

## 2015-09-08 NOTE — Patient Instructions (Addendum)
1. Reduced your speed to 9000 / 8400 RPMs today after speed study.   2. Blood pressure is elevated today-- 102 MAP. Restart your Amlodipine 5 mg daily.   3. Return to [facility] in 4 weeks.   Please bring your power module and Charity fundraiser for maintenance.

## 2015-09-08 NOTE — Progress Notes (Signed)
Echocardiogram 2D Echocardiogram has been performed.  [name] 09/08/2015, 10:40 AM

## 2015-09-08 NOTE — Progress Notes (Signed)
Speed  Flow  PI  Power  LVIDD  AI  Aortic openings  MR  TR  Septum  RV   3.9 9000 7.5 4.9w 6.2 cm none Partial openings 5/5  trivial trace  midline Mild HK   4.0   9200 7.8 5.0w 5.6 cm  none Partial open 5/5 trivial  midline Mild HK  4.4  9400 7.6 5.1w 5.7 cm none Partial open 5/5 trivial  midline   Short axis view showed partial openings whereas different plane assessed to have no AoV openings on 5 beat loop.   Cannula placement visualized on echo showing straight orientation to MV however appears close to septal wall with potential for suction / PI events.   **AoV was over-sewn during initial LVAD placement in 2014**   Ramp ECHO performed at bedside with Dr. Haroldine Laws and myself.   At completion of ramp study, patients primary and back up controller programmed:  Fixed speed: 9000 Low speed limit: 8400     Symptom  Yes  No  Details   Angina       x Activity:   Claudication        x How far:   Syncope        x When:     Stroke        x        TIA  2001  Orthopnea         x How many pillows: 1  PND         x How often:  CPAP       N/A How many hrs:   Pedal edema        x        R>L baseline   Abd fullness               x    N&V               x Gagging episodes resolved (stopped gabapentin and Baclofen; fair appetite; early satiety   Diaphoresis        x        3 - 4 x week; "feeling hot"with diaphoresis  Bleeding              x   Urine color        yellow  SOB        x      Activity: incline, no more than normal.   Palpitations        x When:   ICD shock        x   Hospitlizaitons                 x When/where/why:   ED visit        x When/where/why:  Other MD              x When/who/why:    Activity        Off work due to injuries; "written out until furhter notice"  Fluid    No limitations (36 ozs day)  Diet    Low salt food   Vital signs: HR: 70 MAP BP: 104 Auto cuff:  120/91 (103); 125/84 (101) O2 Sat: 96% Wt:  196.3 lbs Last wt: 193.8   lbs Home wts:  185 - 187  lbs Ht: 6'0"  LVAD interrogation reveals:  Speed: 9200 Flow: 3.9 Power: 4.9w PI: 7.5 Alarms:  No further Low Flow alarms  Events: ~20 -  30 PI events daily Fixed speed:  9200  Low speed limit:  8600 Primary Controller:  Replace back up battery in 10 months  (Feb 2017) Back up controller:   Replace back up battery in 10 months (Feb 2017)  I reviewed the LVAD parameters from today and compared the results to the patient's prior recorded data.  LVAD interrogation revealed 20 - 30 PI events daily with NO FURTHER low flows. Pt denies any symptoms of dizziness, lightheadedness, or syncope. Reports he feels better over last week after increasing fluids other than chronic back pain. LVAD was set to 9000 / 8400 RPMs according to RAMP study. Back up controller set.   Encounter Details: Reports to clinic for 2w FU with his SO, [name]. I reviewed daily log from home for daily temperature, weight, and VAD parameters. PIs improved significantly since 2 weeks ago when we advised to increase fluid intake. Denies dizziness and lightheadedness.  Pt is performing daily controller and system monitor self tests along with completing weekly and monthly maintenance for LVAD equipment.   No further orthostatic PI changes observed during clinic visit as he had last time in setting of GI illness/ hypovolemia. No --- on flows all remained ~ 3.9 - 4.3.   MAP 102 today. Restart Amlodipine 5 mg every day.   INR today --> 2.6 today. Goal 2 - 2.5   LDH 279 today --> stable within baseline 250 - 280. Denies tea colored urine.   RTC in 4 weeks for FU OV. Annual maintenance and 2 year Intermacs to be performed next OV.    Janene Madeira, RN, BSN, CCRN  VAD Coordinator   Office: [phone] 24/7 [place] Pager: [phone]

## 2015-09-11 ENCOUNTER — Other Ambulatory Visit (HOSPITAL_COMMUNITY): Payer: Self-pay | Admitting: Cardiology

## 2015-09-12 ENCOUNTER — Ambulatory Visit (INDEPENDENT_AMBULATORY_CARE_PROVIDER_SITE_OTHER): Payer: Medicare Other | Admitting: *Deleted

## 2015-09-12 DIAGNOSIS — I255 Ischemic cardiomyopathy: Secondary | ICD-10-CM | POA: Diagnosis not present

## 2015-09-12 NOTE — Progress Notes (Signed)
Remote ICD transmission.   

## 2015-09-13 ENCOUNTER — Encounter: Payer: Self-pay | Admitting: Cardiology

## 2015-09-13 LAB — CUP PACEART REMOTE DEVICE CHECK
Battery Voltage: 2.98 V
Brady Statistic AS VP Percent: 0.02 %
Brady Statistic RA Percent Paced: 80.71 %
HIGH POWER IMPEDANCE MEASURED VALUE: 35 Ohm
HighPow Impedance: 46 Ohm
Implantable Lead Implant Date: 20030714
Implantable Lead Location: 753859
Implantable Lead Location: 753860
Implantable Lead Model: 5592
Lead Channel Impedance Value: 399 Ohm
Lead Channel Impedance Value: 551 Ohm
Lead Channel Pacing Threshold Amplitude: 1.125 V
Lead Channel Pacing Threshold Pulse Width: 0.4 ms
Lead Channel Setting Pacing Amplitude: 2.25 V
Lead Channel Setting Sensing Sensitivity: 0.3 mV
MDC IDC LEAD IMPLANT DT: 20140801
MDC IDC LEAD MODEL: 6944
MDC IDC MSMT BATTERY REMAINING LONGEVITY: 84 mo
MDC IDC MSMT LEADCHNL RA PACING THRESHOLD AMPLITUDE: 1.125 V
MDC IDC MSMT LEADCHNL RA SENSING INTR AMPL: 2.25 mV
MDC IDC MSMT LEADCHNL RV IMPEDANCE VALUE: 456 Ohm
MDC IDC MSMT LEADCHNL RV PACING THRESHOLD PULSEWIDTH: 0.4 ms
MDC IDC MSMT LEADCHNL RV SENSING INTR AMPL: 1.375 mV
MDC IDC SESS DTM: 20161107164105
MDC IDC SET LEADCHNL RV PACING AMPLITUDE: 2.25 V
MDC IDC SET LEADCHNL RV PACING PULSEWIDTH: 0.4 ms
MDC IDC STAT BRADY AP VP PERCENT: 0.06 %
MDC IDC STAT BRADY AP VS PERCENT: 80.65 %
MDC IDC STAT BRADY AS VS PERCENT: 19.27 %
MDC IDC STAT BRADY RV PERCENT PACED: 0.08 %

## 2015-09-15 ENCOUNTER — Ambulatory Visit (HOSPITAL_COMMUNITY): Payer: Medicare Other | Admitting: Infectious Diseases

## 2015-09-15 LAB — POCT INR: INR: 2.8

## 2015-09-19 NOTE — Progress Notes (Signed)
Patient ID: [name], male   DOB: 09/23/1941, 74 y.o.   MRN: [id number]    VAD CLINIC PROGRESS NOTE  PCP: Dr. Karolee Stamps ([facility])  Rutilio Grilli is a 74 yo male with h/o VT, PAD and severe systolic HF (EF 0000000) due to mixed ischemic/nonischemic CM he is s/p HM II LVAD implant for destination therapy on October 19, 2013. On 09/01/14 underwent cystoscopy and flexible ureteroscopy with lithotripsy and basket extraction.   VAD implantation was complicated by VT, syncope, A fib/Aflutter and persistent low output felt to be due to RHF. RHC showed low cardiac output but no evidence of RV failure on Milrinone. He was felt to be volume depleted. He was discharged home on 11/18/13 on mirlinone and PRN lasix 20 mg for a weight of 200 pounds or greater. Milrinone has since been weaned off.   RHC (07/13/14): Well compensated hemodynamics with VAD support.  Admitted 11/9-11/19/15 for hematuria and chronic nephrolithiasis. Underwent Echo to assess RV for concern RV failure because he presented with SOB. RV looked good and had renal US showed mild hyronephrosis and bilateral renal cysts. Dr. Risa Grill took back for repeat lithotripsy and stone extraction by ureteroscopy and J stent placement. Received total of 3 PRBCs. D/C weight 188 lbs.   In 4/16 admitted with weakness and fall. In ER hemoglobin was found to be 5.4 with MAP 78.  While hospitalized he received 5U PRBCs. 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 Then readmitted with acute L4 compression fracture. Most recently admitted with weakness and low PI. Found to have recurrent atrial tach and underwent TEE/DCC on 03/02/15  Follow up LVAD:  He returns for followup.  Feeling stronger. No further orthostatic episodes. Trying to walk regularly. Occasional edema. No bleeding or dark stools. No fevers/chills. Still with chronic back pain. On VAD multiple PI events but no further low flow alarms.     LVAD interrogation reveals:  Speed: 9200  Flow: 3.9  Power: 4.9w  PI: 7.5  Alarms: No further Low Flow alarms  Events: ~20 - 30 PI events daily  Fixed speed: 9200  Low speed limit: 8600  Primary Controller: Replace back up battery in 10 months (Feb 2017)  Back up controller: Replace back up battery in 10 months (Feb 2017)   I reviewed the LVAD parameters from today and compared the results to the patient's prior recorded data. LVAD interrogation revealed 20 - 30 PI events daily with NO FURTHER low flows. Pt denies any symptoms of dizziness, lightheadedness, or syncope. LVAD was set to 9000 / 8400 RPMs according to RAMP study. Back up controller set.    Labs (10/16): Hgb 12.2 => 12.7, BNP 305, K 4.4, creatinine 1.46, LDL 264  ECG: NSR, 1st degree AV block, IVCD   Past Medical History  Diagnosis Date  . Bradycardia   . Ventricular tachycardia     s/p AICD;   h/o ICD shock;  Amiodarone Rx. S/P ablation in 2012  . 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 HTN (hypertension)   . HLD (hyperlipidemia)   . Cytopenia   . Hypothyroidism   . Renal insufficiency   . Claudication   . Chronic systolic heart failure   . Ischemic cardiomyopathy  echo 7/12:  EF 25-30%, mild AI, mod MR, mod LAE  . TIA (transient ischemic attack) 2001  . CVD (cerebrovascular disease)     left CEA 2008  . Stroke   . Anemia   . Inappropriate therapy from implantable cardioverter-defibrillator 04/02/2013    ATP for sinus tach  SVT wavelet identified SVT but was no  passive   . Myocardial infarction ([place]) 1992  . Anginal pain   . Automatic implantable cardioverter-defibrillator in situ     Medtronic Evera device serial number [id number]    . Arthritis     "a little in my knees" (05/25/2013)  . Melanoma of ear 2013    "near left ear" (05/25/2013)  . Loss of R waves on his  ICD 03/30/2014  . Dysrhythmia     HX A FIB  . History of nonmelanoma skin cancer     L EAR  . Kidney stone   . History of transfusion   . Sleep apnea     denies wearing mask (05/25/2013)  . CAD (coronary artery disease)     s/p MI 1982;  s/p DES to CFX 2011;  s/p NSTEMI 4/12 in setting of VTach;  cath 7/12:  LM ok, LAD mid 30-40%, Dx's with 40%, mCFX stent patent, prox to mid RCA occluded with R to R and L to R collats.  Medical Tx was continued  . CHF (congestive heart failure)     HAS VENTRICULAR PUMP    Current Outpatient Prescriptions  Medication Sig Dispense Refill  . acetaminophen (TYLENOL) 325 MG tablet Take 650 mg by mouth every 6 (six) hours as needed (pain).    [name] atorvastatin (LIPITOR) 80 MG tablet Take 80 mg by mouth daily.     [name] docusate sodium (COLACE) 100 MG capsule Take 200 mg by mouth daily. Taking every other day    . ferrous gluconate (FERGON) 324 MG tablet Take 1 tablet (324 mg total) by mouth 2 (two) times daily with a meal. 60 tablet 6  . furosemide (LASIX) 20 MG tablet Take 20 mg by mouth daily as needed for fluid.     [name] levothyroxine (SYNTHROID, LEVOTHROID) 200 MCG tablet Take one 200 mcg daily before breakfast every day with 1 1/2 (300 mcg) every Monday. 30 tablet 6  . magnesium gluconate (MAGONATE) 500 MG tablet Take 1,000 mg by mouth at bedtime.     . Multiple Vitamin (MULTIVITAMIN WITH MINERALS) TABS tablet Take 1 tablet by mouth at bedtime.    . pantoprazole (PROTONIX) 40 MG tablet Take 1 tablet (40 mg total) by mouth 2 (two) times daily. 60 tablet 6  . warfarin (COUMADIN) 2 MG tablet As directed 50 tablet 6  . zinc gluconate 50 MG tablet Take 100 mg by mouth at bedtime.     [name] RANEXA 500 MG 12 hr tablet TAKE 1 TABLET (500 MG TOTAL) BY MOUTH 2 (TWO) TIMES DAILY. 60 tablet 6   No current facility-administered medications for this encounter.    Demerol  REVIEW OF SYSTEMS: All systems negative except as listed in HPI, PMH and Problem list.  LVAD  interrogation reveals:  Speed:   9200  Flow: --- Power: 4.4 PI: 2.8 Alarms:  4 low flow alarms Events: 25 - 45 PI events daily Fixed speed:  9200  Low speed limit:  8600 Primary Controller:  Replace back up battery in 13 months  (Feb 2017) Back up controller:   Replace back up battery in 13 months (Feb 2017)  Vital signs: HR: 70 MAP BP: 104 Auto cuff: 120/91 (103); 125/84 (101) O2 Sat: 96% Wt: 196.3 lbs Last wt: 193.8 lbs Home wts: 185 - 187 lbs Ht: 6'0"  Physical Exam: GENERAL: NAD HEENT: normal NECK: Supple, no JVD, no carotid bruits.  No lymphadenopathy or thyromegaly appreciated.   CARDIAC:  Mechanical heart sounds with LVAD hum present.  LUNGS:  clear.  ABDOMEN:  Soft, round, nontender, positive bowel sounds x4.     LVAD exit site: well-healed and incorporated.  Dressing dry and intact.  No erythema or drainage.  Stabilization device present and accurately applied.  Driveline dressing is being changed weekly per sterile technique. EXTREMITIES:  Warm and dry, no cyanosis, clubbing, no edema.  NEUROLOGIC:  Alert and oriented x 4.  Gait steady.  No aphasia.  No dysarthria.  Affect pleasant.  ASSESSMENT AND PLAN:  1. Chronic systolic HF: s/p LVAD [id number].   - Overall improved NYHA II.  - Multiple PI events on VAD but no further low flows with increased hydration. Ramp echo done personally today and VAD speed turned down to 9000.  - Take lasix only as needed.  2. Atrial arrhythmias: Atrial fibrillation, atrial tachycardia.  s/p DC-CV 06/04/14 and repeat DC-CV 03/02/15.  Now off amio due to thyrotoxicity. Watch closely for recurrent AF, none seen on device interrogation.  4 HTN: MAP up. Restart amlodipine.  5. LVAD: LVAD parameters reviewed personally. Still with PI events. Ramp echo done today and speed down to 9000.  6.CKD, stage III: BMET today.  7. Anticoagulation management: Goal INR 2-2.5. He is off ASA with GI bleeding.  8. S/P Renal Stone lithotripsy: Per Dr  Risa Grill. 9. 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 Hemoglobin stable today with no signs of acute bleeding.    Glori Bickers MD 09/19/2015

## 2015-09-20 ENCOUNTER — Telehealth (HOSPITAL_COMMUNITY): Payer: Self-pay | Admitting: *Deleted

## 2015-09-20 NOTE — Telephone Encounter (Signed)
[name] called to request VAD clinic appt for "probable bronchitis" for today. Advised her to have pt see PCP if possible, no clinic appts available today. She asked if pt could be seen tomorrow here. Appt scheduled for 09/21/15 at 2 pm.

## 2015-09-20 NOTE — Telephone Encounter (Signed)
Re-scheduled appt for Thursday, September 22, 2015 at 12:00. No appts available Wednesday. Did offer work in appt for this afternoon, but pt and [name] are in [place].

## 2015-09-21 ENCOUNTER — Emergency Department (HOSPITAL_COMMUNITY): Payer: Medicare Other

## 2015-09-21 ENCOUNTER — Emergency Department (HOSPITAL_COMMUNITY)
Admission: EM | Admit: 2015-09-21 | Discharge: 2015-09-21 | Disposition: A | Discharge status: Home / Self Care | Payer: Medicare Other | Attending: Emergency Medicine | Admitting: Emergency Medicine

## 2015-09-21 ENCOUNTER — Encounter (HOSPITAL_COMMUNITY): Payer: Self-pay | Admitting: Emergency Medicine

## 2015-09-21 ENCOUNTER — Telehealth (HOSPITAL_COMMUNITY): Payer: Self-pay | Admitting: Infectious Diseases

## 2015-09-21 DIAGNOSIS — Z87891 Personal history of nicotine dependence: Secondary | ICD-10-CM | POA: Insufficient documentation

## 2015-09-21 DIAGNOSIS — Z79899 Other long term (current) drug therapy: Secondary | ICD-10-CM | POA: Insufficient documentation

## 2015-09-21 DIAGNOSIS — I252 Old myocardial infarction: Secondary | ICD-10-CM | POA: Insufficient documentation

## 2015-09-21 DIAGNOSIS — R0602 Shortness of breath: Secondary | ICD-10-CM | POA: Diagnosis present

## 2015-09-21 DIAGNOSIS — Z85820 Personal history of malignant melanoma of skin: Secondary | ICD-10-CM | POA: Insufficient documentation

## 2015-09-21 DIAGNOSIS — D649 Anemia, unspecified: Secondary | ICD-10-CM | POA: Insufficient documentation

## 2015-09-21 DIAGNOSIS — Z95810 Presence of automatic (implantable) cardiac defibrillator: Secondary | ICD-10-CM | POA: Diagnosis not present

## 2015-09-21 DIAGNOSIS — E785 Hyperlipidemia, unspecified: Secondary | ICD-10-CM | POA: Diagnosis not present

## 2015-09-21 DIAGNOSIS — I10 Essential (primary) hypertension: Secondary | ICD-10-CM | POA: Insufficient documentation

## 2015-09-21 DIAGNOSIS — E039 Hypothyroidism, unspecified: Secondary | ICD-10-CM | POA: Diagnosis not present

## 2015-09-21 DIAGNOSIS — M1990 Unspecified osteoarthritis, unspecified site: Secondary | ICD-10-CM | POA: Insufficient documentation

## 2015-09-21 DIAGNOSIS — Z87448 Personal history of other diseases of urinary system: Secondary | ICD-10-CM | POA: Insufficient documentation

## 2015-09-21 DIAGNOSIS — Z87442 Personal history of urinary calculi: Secondary | ICD-10-CM | POA: Insufficient documentation

## 2015-09-21 DIAGNOSIS — Z8669 Personal history of other diseases of the nervous system and sense organs: Secondary | ICD-10-CM | POA: Insufficient documentation

## 2015-09-21 DIAGNOSIS — Z8673 Personal history of transient ischemic attack (TIA), and cerebral infarction without residual deficits: Secondary | ICD-10-CM | POA: Insufficient documentation

## 2015-09-21 DIAGNOSIS — J810 Acute pulmonary edema: Secondary | ICD-10-CM | POA: Diagnosis not present

## 2015-09-21 DIAGNOSIS — I5022 Chronic systolic (congestive) heart failure: Secondary | ICD-10-CM | POA: Diagnosis not present

## 2015-09-21 DIAGNOSIS — I25119 Atherosclerotic heart disease of native coronary artery with unspecified angina pectoris: Secondary | ICD-10-CM | POA: Insufficient documentation

## 2015-09-21 DIAGNOSIS — Z7901 Long term (current) use of anticoagulants: Secondary | ICD-10-CM | POA: Insufficient documentation

## 2015-09-21 LAB — CBC
HCT: 32 % — ABNORMAL LOW (ref 39.0–52.0)
HEMOGLOBIN: 10.8 g/dL — AB (ref 13.0–17.0)
MCH: 32.1 pg (ref 26.0–34.0)
MCHC: 33.8 g/dL (ref 30.0–36.0)
MCV: 95.2 fL (ref 78.0–100.0)
PLATELETS: 176 10*3/uL (ref 150–400)
RBC: 3.36 MIL/uL — AB (ref 4.22–5.81)
RDW: 16.8 % — ABNORMAL HIGH (ref 11.5–15.5)
WBC: 6.7 10*3/uL (ref 4.0–10.5)

## 2015-09-21 LAB — I-STAT TROPONIN, ED
TROPONIN I, POC: 0.03 ng/mL (ref 0.00–0.08)
Troponin i, poc: 0.03 ng/mL (ref 0.00–0.08)

## 2015-09-21 LAB — BASIC METABOLIC PANEL
ANION GAP: 11 (ref 5–15)
BUN: 21 mg/dL — ABNORMAL HIGH (ref 6–20)
CALCIUM: 8.9 mg/dL (ref 8.9–10.3)
CO2: 22 mmol/L (ref 22–32)
Chloride: 104 mmol/L (ref 101–111)
Creatinine, Ser: 1.24 mg/dL (ref 0.61–1.24)
GFR, EST NON AFRICAN AMERICAN: 55 mL/min — AB (ref >60)
Glucose, Bld: 113 mg/dL — ABNORMAL HIGH (ref 65–99)
Potassium: 4.1 mmol/L (ref 3.5–5.1)
SODIUM: 137 mmol/L (ref 135–145)

## 2015-09-21 LAB — BRAIN NATRIURETIC PEPTIDE: B NATRIURETIC PEPTIDE 5: 638.3 pg/mL — AB (ref 0.0–100.0)

## 2015-09-21 LAB — PROTIME-INR
INR: 2.36 — AB (ref 0.00–1.49)
PROTHROMBIN TIME: 25.6 s — AB (ref 11.6–15.2)

## 2015-09-21 LAB — LACTATE DEHYDROGENASE: LDH: 233 U/L — AB (ref 98–192)

## 2015-09-21 MED ORDER — POTASSIUM CHLORIDE CRYS ER 20 MEQ PO TBCR: 10.0000 meq | EXTENDED_RELEASE_TABLET | Freq: Once | ORAL | Status: DC

## 2015-09-21 MED ORDER — FUROSEMIDE 10 MG/ML IJ SOLN
40.0000 mg | Freq: Once | INTRAMUSCULAR | Status: AC
  Administered 2015-09-21: 40 mg via INTRAVENOUS

## 2015-09-21 MED ORDER — POTASSIUM CHLORIDE CRYS ER 20 MEQ PO TBCR
20.0000 meq | EXTENDED_RELEASE_TABLET | Freq: Two times a day (BID) | ORAL | Status: DC
  Administered 2015-09-21: 20 meq via ORAL

## 2015-09-21 MED ORDER — FUROSEMIDE 20 MG PO TABS: 20.0000 mg | ORAL_TABLET | Freq: Every day | ORAL | Status: DC

## 2015-09-21 MED ORDER — ALBUTEROL SULFATE (2.5 MG/3ML) 0.083% IN NEBU
2.5000 mg | INHALATION_SOLUTION | Freq: Once | RESPIRATORY_TRACT | Status: AC
  Administered 2015-09-21: 2.5 mg via RESPIRATORY_TRACT

## 2015-09-21 MED ORDER — POTASSIUM CHLORIDE 10 MEQ/100ML IV SOLN
10.0000 meq | INTRAVENOUS | Status: AC
  Administered 2015-09-21: 10 meq via INTRAVENOUS

## 2015-09-21 NOTE — ED Notes (Signed)
Pt placed back on monitor. Nad noted. Abc intact.

## 2015-09-21 NOTE — ED Notes (Signed)
Patient with increased shortness of breath since Sunday.  Midsternal pressure since Sunday.  Audible wheezing noted.  Patient is CAOx4.  Patient is an LVAD patient of Dr Jeffie Pollock.

## 2015-09-21 NOTE — ED Notes (Signed)
LVAD settings:  Pump flow 5.8, Pump speed 9000, Pulse Index 7.1, Pump power 5.5

## 2015-09-21 NOTE — Discharge Instructions (Signed)
Follow-up with Dr. Haroldine Laws. Take 20 mg Lasix daily until you are evaluated. Return immediately for worsening shortness of breath, fever, chest pain or for any concerns.   Pulmonary Edema Pulmonary edema (PE) is a condition in which fluid collects in the lungs. This makes it hard to breathe. PE may be a result of the heart not pumping very well or a result of injury.  CAUSES   Coronary artery disease causes blockages in the arteries of the heart. This deprives the heart muscle of oxygen and weakens the muscle. A heart attack is a form of coronary artery disease.  High blood pressure causes the heart muscle to work harder than usual. Over time, the heart muscle may get stiff, and it starts to work less efficiently. It may also fatigue and weaken.  Viral infection of the heart (myocarditis) may weaken the heart muscle.  Metabolic conditions such as thyroid disease, excessive alcohol use, certain vitamin deficiencies, or diabetes may also weaken the heart muscle.  Leaky or stiff heart valves may impair normal heart function.  Lung disease may strain the heart muscle.  Excessive demands on the heart such as too much salt or fluid intake.  Failure to take prescribed medicines.  Lung injury from heat or toxins, such as poisonous gas.  Infection in the lungs or other parts of the body.  Fluid overload caused by kidney failure or medicines. SYMPTOMS   Shortness of breath at rest or with exertion.  Grunting, wheezing, or gurgling while breathing.  Feeling like you cannot get enough air.  Breaths are shallow and fast.  A lot of coughing with frothy or bloody mucus.  Skin may become cool, damp, and turn a pale or bluish color. DIAGNOSIS  Initial diagnosis may be based on your history, symptoms, and a physical examination. Additional tests for PE may include:  Electrocardiography.  Chest X-ray.  Blood tests.  Stress test.  Ultrasound evaluation of the heart  (echocardiography).  Evaluation by a heart doctor (cardiologist).  Test of the heart arteries to look for blockages (angiography).  Check of blood oxygen. TREATMENT  Treatment of PE will depend on the underlying cause and will focus first on relieving the symptoms.   Extra oxygen to make breathing easier and assist with removing mucus. This may include breathing treatments or a tube into the lungs and a breathing machine.  Medicine to help the body get rid of extra water, usually through an IV tube.  Medicine to help the heart pump better.  If poor heart function is the cause, treatment may include:  Procedures to open blocked arteries, repair damaged heart valves, or remove some of the damaged heart muscle.  A pacemaker to help the heart pump with less effort. HOME CARE INSTRUCTIONS   Your health care provider will help you determine what type of exercise program may be helpful. It is important to maintain strength and increase it if possible. Pace your activities to avoid shortness of breath or chest pain. Rest for at least 1 hour before and after meals. Cardiac rehabilitation programs are available in some locations.  Eat a heart-healthy diet low in salt, saturated fat, and cholesterol. Ask for help with choices.  Make a list of every medicine, vitamin, or herbal supplement you are taking. Keep the list with you at all times. Show it to your health care provider at every visit and before starting a new medicine. Keep the list up to date.  Ask your health care provider or pharmacist to  help you write a plan or schedule so that you know things about each medicine such as:  Why you are taking it.  The possible side effects.  The best time of day to take it.  Foods to take with it or avoid.  When to stop taking it.  Record your hospital or clinic weight. When you get home, compare it to your scale and record your weight. Then, weigh yourself first thing in the morning daily,  and record the weights. You should weigh yourself every morning after you urinate and before you eat breakfast. Wear the same amount of clothing each time you weigh yourself. Provide your health care provider with your weight record. Daily weights are important in the early recognition of excess fluid. Tell your health care provider right away if you have gained 03 lb/1.4 kg in 1 day, 05 lb/2.3 kg in a week, or as directed by your health care provider. Your medicines may need to be adjusted.  Blood pressure monitoring should be done as often as directed. You can get a home blood pressure cuff at your drugstore. Record these values and bring them with you for your clinic visits. Notify your health care provider if you become dizzy or light-headed when standing up.  If you are currently a smoker, it is time to quit. Nicotine makes your heart work harder and is one of the leading causes of cardiac deaths. Do not use nicotine gum or patches before talking to your doctor.  Make a follow-up appointment with your health care provider as directed.  Ask your health care provider for a copy of your latest heart tracing (ECG) and keep a copy with you at all times. SEEK IMMEDIATE MEDICAL CARE IF:   You have severe chest pain, especially if the pain is crushing or pressure-like and spreads to the arms, back, neck, or jaw. THIS IS AN EMERGENCY. Do not wait to see if the pain will go away. Call for local emergency medical help. Do not drive yourself to the hospital.  You have sweating, feel sick to your stomach (nauseous), or are experiencing shortness of breath.  Your weight increases by 03 lb/1.4 kg in 1 day or 05 lb/2.3 kg in a week.  You notice increasing shortness of breath that is unusual for you. This may happen during rest, sleep, or with activity.  You develop chest pain (angina) or pain that is unusual for you.  You notice more swelling in your hands, feet, ankles, or abdomen.  You notice lasting  (persistent) dizziness, blurred vision, headache, or unsteadiness.  You begin to cough up bloody mucus (sputum).  You are unable to sleep because it is hard to breathe.  You begin to feel a "jumping" or "fluttering" sensation (palpitations) in the chest that is unusual for you. MAKE SURE YOU:  Understand these instructions.  Will watch your condition.  Will get help right away if you are not doing well or get worse.   This information is not intended to replace advice given to you by your health care provider. Make sure you discuss any questions you have with your health care provider.   Document Released: 01/12/2003 Document Revised: 10/27/2013 Document Reviewed: 06/29/2013 Elsevier Interactive Patient Education [employer].

## 2015-09-21 NOTE — ED Notes (Signed)
Patient returned from xray.  Patient placed on HeartMate bedside machine.

## 2015-09-21 NOTE — ED Notes (Signed)
To x-ray

## 2015-09-21 NOTE — Telephone Encounter (Signed)
Paged by [name] to inform me she was in route to ED with [name]--reports he is having trouble breathing and he wanted her to take him to the ED. [place] early [place] beach today since he was feeling bad. No fevers or chest pain reported. Primary complaint is nasal congestion and cough. Did not sleep well last night and required to sit upright to breathe. Uncertain as to the onset/duration of symptoms. Denies weight gain and reports that "he hasn't been drinking enough for him to have fluid on him."   Dr. Aundra Dubin updated, ED charge RN and Rapid Response RN notified to facilitate VAD equipment--however VAD does not seem to be the problem.

## 2015-09-21 NOTE — Progress Notes (Signed)
Came to assess pt and give neb, pt in X-ray per RN.

## 2015-09-21 NOTE — ED Notes (Signed)
Patient is resting comfortably. 

## 2015-09-21 NOTE — ED Provider Notes (Signed)
CSN: [id number]     Arrival date & time 09/21/15  0011 History   By signing my name below, I, Forrestine Him, attest that this documentation has been prepared under the direction and in the presence of Julianne Rice, MD.  Electronically Signed: Forrestine Him, ED Scribe. 09/21/2015. 4:32 AM.   Chief Complaint  Patient presents with  . Shortness of Breath   The history is provided by the patient. No language interpreter was used.    HPI Comments: MAHONRI MILHOUSE is a 74 y.o. male with a PMHx of LVAD, HTN, HLD, TIA, CVD, MI, CHF who presents to the Emergency Department complaining of constant, ongoing, worsening shortness of breath x 1 day. Ongoing low grade fever, chills, mid-sternal pressure like chest discomfort, and productive cough also reported. No OTC medications, home remedies, and at home treatments attempted prior to arrival. No lower extremity swelling. Mr. Steever is on Coumadin with most recent PT-INR reading of 1.9.  PCP: [name], MD   CARDIOLOGIST: Beverley Fiedler, MD  Past Medical History  Diagnosis Date  . Bradycardia   . Ventricular tachycardia ([place])     s/p AICD;   h/o ICD shock;  Amiodarone Rx. S/P ablation in 2012  . PVD (peripheral vascular disease) ([place])     h/o claudication;  s/p R fem to pop BPG 3/11;  s/p L CFA BPG 7/03;  s/p repair of inf AAA 6/03;  s/p aorta to bilat renal BPG 6/03  . HTN (hypertension)   . HLD (hyperlipidemia)   . Cytopenia   . Hypothyroidism   . Renal insufficiency   . Claudication ([place])   . Chronic systolic heart failure ([place])   . Ischemic cardiomyopathy     echo 7/12:  EF 25-30%, mild AI, mod MR, mod LAE  . TIA (transient ischemic attack) 2001  . CVD (cerebrovascular disease)     left CEA 2008  . Stroke ([place])   . Anemia   . Inappropriate therapy from implantable cardioverter-defibrillator 04/02/2013    ATP for sinus tach  SVT wavelet identified SVT but was no  passive   . Myocardial infarction ([place]) 1992  . Anginal pain  ([place])   . Automatic implantable cardioverter-defibrillator in situ     Medtronic Evera device serial number [id number]    . Arthritis     "a little in my knees" (05/25/2013)  . 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 Dysrhythmia     HX A FIB  . History of nonmelanoma skin cancer     L EAR  . Kidney stone   . History of transfusion   . Sleep apnea     denies wearing mask (05/25/2013)  . CAD (coronary artery disease)     s/p MI 1982;  s/p DES to CFX 2011;  s/p NSTEMI 4/12 in setting of VTach;  cath 7/12:  LM ok, LAD mid 30-40%, Dx's with 40%, mCFX stent patent, prox to mid RCA occluded with R to R and L to R collats.  Medical Tx was continued  . CHF (congestive heart failure) ([place])     HAS VENTRICULAR PUMP   Past Surgical History  Procedure Laterality Date  . Femoral-popliteal bypass graft Right 2011  . Cardiac defibrillator placement  2003; 2005; 05/25/2013    2014: Medtronic Evera device serial number [id number]  . Revascularization / in-situ graft leg    . Renal artery bypass  Bilateral 2003  . Back surgery    . Lumbar disc surgery  1974; 2000's  . Knee arthroscopy Bilateral 1990's  . Elbow arthroscopy Right 1990's  . Cholecystectomy  12/15/?2010  . Lipoma excision Left 2013    "near ear" (05/25/2013)  . Heel spur surgery Right 1990's  . Cataract extraction w/ intraocular lens  implant, bilateral Bilateral 2000  . Carotid endarterectomy Left ~ 2007  . Doppler echocardiography  2011  . Tonsillectomy  1946  . Femoral-popliteal bypass graft Left 05/2002    Archie Endo 05/06/2002 (05/25/2013)  . Tumor excision Left 1960's    "fatty tumor" (05/25/2013)  . Cardiac electrophysiology study and ablation  2012  . Coronary angioplasty  1992  . Cardiac catheterization      "several" (05/25/2013)  . Coronary angioplasty with stent placement      "last one was 11/2012  (05/25/2013)  . Cardioversion N/A 10/15/2013    Procedure:  CARDIOVERSION;  Surgeon: Jolaine Artist, MD;  Location: [place];  Service: Cardiovascular;  Laterality: N/A;  . Insertion of implantable left ventricular assist device N/A 10/19/2013    Procedure: INSERTION OF IMPLANTABLE LEFT VENTRICULAR ASSIST DEVICE;  Surgeon: Gaye Pollack, MD;  Location: [place];  Service: Open Heart Surgery;  Laterality: N/A;  CIRC ARREST  NITRIC OXIDE  MEDTRONIC ICD  . Intraoperative transesophageal echocardiogram N/A 10/19/2013    Procedure: INTRAOPERATIVE TRANSESOPHAGEAL ECHOCARDIOGRAM;  Surgeon: Gaye Pollack, MD;  Location: [facility] OR;  Service: Open Heart Surgery;  Laterality: N/A;  . Tee without cardioversion N/A 11/04/2013    Procedure: TRANSESOPHAGEAL ECHOCARDIOGRAM (TEE);  Surgeon: Larey Dresser, MD;  Location: [place];  Service: Cardiovascular;  Laterality: N/A;  . Cardioversion N/A 11/04/2013    Procedure: CARDIOVERSION;  Surgeon: Larey Dresser, MD;  Location: [place];  Service: Cardiovascular;  Laterality: N/A;  . Cardioversion N/A 06/04/2014    Procedure: CARDIOVERSION;  Surgeon: Jolaine Artist, MD;  Location: [facility] ENDOSCOPY;  Service: Cardiovascular;  Laterality: N/A;  . Cystoscopy with retrograde pyelogram, ureteroscopy and stent placement Left 08/09/2014    Procedure: Josephine, URETEROSCOPY AND STENT PLACEMENT;  Surgeon: Bernestine Amass, MD;  Location: [facility] ORS;  Service: Urology;  Laterality: Left;  . Cystoscopy with retrograde pyelogram, ureteroscopy and stent placement Left 09/01/2014    Procedure: CYSTOSCOPY WITH URETEROSCOPY AND STENT REMOVAL;  Surgeon: Bernestine Amass, MD;  Location: [facility] ORS;  Service: Urology;  Laterality: Left;  . Holmium laser application Left [id number]    Procedure: HOLMIUM LASER APPLICATION;  Surgeon: Bernestine Amass, MD;  Location: [facility];  Service: Urology;  Laterality: Left;  . Cystoscopy with retrograde pyelogram, ureteroscopy and stent placement Left 09/20/2014    Procedure: CYSTOSCOPY  WITH RETROGRADE PYELOGRAM, URETEROSCOPY AND STENT PLACEMENT, stone removal;  Surgeon: Bernestine Amass, MD;  Location: [facility];  Service: Urology;  Laterality: Left;  Stephanie Coup ablation N/A 09/12/2011    Procedure: V-TACH ABLATION;  Surgeon: Evans Lance, MD;  Location: [facility] CATH LAB;  Service: Cardiovascular;  Laterality: N/A;  . Left heart catheterization with coronary angiogram N/A 11/24/2012    Procedure: LEFT HEART CATHETERIZATION WITH CORONARY ANGIOGRAM;  Surgeon: Peter M Martinique, MD;  Location: [facility] CATH LAB;  Service: Cardiovascular;  Laterality: N/A;  . Implantable cardioverter defibrillator generator change N/A 05/25/2013    Procedure: IMPLANTABLE CARDIOVERTER DEFIBRILLATOR GENERATOR CHANGE;  Surgeon: Deboraha Sprang, MD;  Location: [facility] CATH LAB;  Service: Cardiovascular;  Laterality: N/A;  . Lead revision N/A 06/05/2013  Procedure: LEAD REVISION;  Surgeon: Thompson Grayer, MD;  Location: [facility] CATH LAB;  Service: Cardiovascular;  Laterality: N/A;  . V-tach ablation N/A 06/08/2013    Procedure: V-TACH ABLATION;  Surgeon: Evans Lance, MD;  Location: [facility] CATH LAB;  Service: Cardiovascular;  Laterality: N/A;  . Right heart catheterization N/A 09/17/2013    Procedure: RIGHT HEART CATH;  Surgeon: Jolaine Artist, MD;  Location: [facility] CATH LAB;  Service: Cardiovascular;  Laterality: N/A;  . Right heart catheterization N/A 10/14/2013    Procedure: RIGHT HEART CATH;  Surgeon: Jolaine Artist, MD;  Location: [facility] CATH LAB;  Service: Cardiovascular;  Laterality: N/A;  . Right heart catheterization N/A 11/16/2013    Procedure: RIGHT HEART CATH;  Surgeon: Jolaine Artist, MD;  Location: [facility] CATH LAB;  Service: Cardiovascular;  Laterality: N/A;  . Right heart catheterization N/A 07/13/2014    Procedure: RIGHT HEART CATH;  Surgeon: Jolaine Artist, MD;  Location: [facility] CATH LAB;  Service: Cardiovascular;  Laterality: N/A;  . Esophagogastroduodenoscopy N/A 02/15/2015    Procedure: ESOPHAGOGASTRODUODENOSCOPY (EGD);   Surgeon: Carol Ada, MD;  Location: [facility] ENDOSCOPY;  Service: Endoscopy;  Laterality: N/A;  LVAD  . Tee without cardioversion N/A 03/02/2015    Procedure: TRANSESOPHAGEAL ECHOCARDIOGRAM (TEE);  Surgeon: Jolaine Artist, MD;  Location: [facility] ENDOSCOPY;  Service: Cardiovascular;  Laterality: N/A;  . Cardioversion N/A 03/02/2015    Procedure: CARDIOVERSION;  Surgeon: Jolaine Artist, MD;  Location: [facility] ENDOSCOPY;  Service: Cardiovascular;  Laterality: N/A;   Family History  Problem Relation Age of Onset  . Coronary artery disease    . Heart attack Mother   . Coronary artery disease Mother   . Cancer Mother   . Heart disease Mother   . Hyperlipidemia Mother   . Hypertension Mother   . Coronary artery disease Father   . Stroke Father   . Cancer Father   . Heart disease Father     before age 65  . Hyperlipidemia Father   . Hypertension Father   . Heart attack Father    Social History  Substance Use Topics  . Smoking status: Former Smoker -- 0.50 packs/day for 37 years    Types: Cigarettes    Quit date: 11/05/2001  . Smokeless tobacco: Never Used  . Alcohol Use: 0.0 oz/week     Comment: 05/25/2013 "mixed drink couple times/month"    Review of Systems  Constitutional: Negative for fever and chills.  HENT: Positive for congestion and sinus pressure. Negative for sore throat.   Respiratory: Positive for cough, shortness of breath and wheezing.   Cardiovascular: Positive for chest pain. Negative for palpitations and leg swelling.  Gastrointestinal: Negative for nausea, vomiting and abdominal pain.  Genitourinary: Negative for dysuria.  Musculoskeletal: Negative for back pain, neck pain and neck stiffness.  Skin: Negative for rash and wound.  Neurological: Negative for dizziness, syncope, weakness, numbness and headaches.  Psychiatric/Behavioral: Negative for confusion.  All other systems reviewed and are negative.     Allergies  Demerol  Home Medications   Prior to  Admission medications   Medication Sig Start Date End Date Taking? Authorizing Provider  acetaminophen (TYLENOL) 325 MG tablet Take 650 mg by mouth every 6 (six) hours as needed (pain).   Yes Historical Provider, MD  atorvastatin (LIPITOR) 80 MG tablet Take 80 mg by mouth daily.    Yes Historical Provider, MD  docusate sodium (COLACE) 100 MG capsule Take 200 mg by mouth daily.    Yes Historical Provider,  MD  ferrous gluconate (FERGON) 324 MG tablet Take 1 tablet (324 mg total) by mouth 2 (two) times daily with a meal. 06/23/15  Yes Amy D Clegg, NP  levothyroxine (SYNTHROID, LEVOTHROID) 200 MCG tablet Take one 200 mcg daily before breakfast every day with 1 1/2 (300 mcg) every Monday. 07/14/15  Yes Jolaine Artist, MD  magnesium gluconate (MAGONATE) 500 MG tablet Take 1,000 mg by mouth at bedtime.    Yes Historical Provider, MD  Multiple Vitamin (MULTIVITAMIN WITH MINERALS) TABS tablet Take 1 tablet by mouth at bedtime.   Yes Historical Provider, MD  pantoprazole (PROTONIX) 40 MG tablet Take 1 tablet (40 mg total) by mouth 2 (two) times daily. 02/24/15  Yes Amy D Clegg, NP  RANEXA 500 MG 12 hr tablet TAKE 1 TABLET (500 MG TOTAL) BY MOUTH 2 (TWO) TIMES DAILY. 09/12/15  Yes Larey Dresser, MD  warfarin (COUMADIN) 2 MG tablet As directed Patient taking differently: Take 2-4 mg by mouth daily. Take 2mg  everyday except Tuesday and Thursday take 4mg . 07/14/15  Yes Jolaine Artist, MD  zinc gluconate 50 MG tablet Take 100 mg by mouth at bedtime.    Yes Historical Provider, MD  furosemide (LASIX) 20 MG tablet Take 1 tablet (20 mg total) by mouth daily. 09/21/15   Julianne Rice, MD   Triage Vitals: BP   Pulse 75  Temp(Src) 97.6 F (36.4 C) (Oral)  Resp 27  SpO2 99%   Physical Exam  Constitutional: He is oriented to person, place, and time. He appears well-developed and well-nourished. No distress.  HENT:  Head: Normocephalic and atraumatic.  Mouth/Throat: Oropharynx is clear and moist. No  oropharyngeal exudate.  Eyes: EOM are normal. Pupils are equal, round, and reactive to light.  Neck: Normal range of motion. Neck supple.  Cardiovascular: Normal rate and regular rhythm.   Mechanical heart sound  Pulmonary/Chest: Effort normal. No respiratory distress. He has wheezes. He has no rales.  Expiratory wheezing bilateral bases  Abdominal: Soft. Bowel sounds are normal. He exhibits no distension and no mass. There is no tenderness. There is no rebound and no guarding.  Musculoskeletal: Normal range of motion. He exhibits no edema or tenderness.  No lower extremity pitting edema  Neurological: He is alert and oriented to person, place, and time.  Skin: Skin is warm and dry. No rash noted. No erythema.  Psychiatric: He has a normal mood and affect. His behavior is normal.  Nursing note and vitals reviewed.   ED Course  Procedures (including critical care time)  DIAGNOSTIC STUDIES: Oxygen Saturation is 97% on RA, adequate by my interpretation.    COORDINATION OF CARE: 12:27 AM- Will give breathing treatment. Will order CXR, Lactate dehydrogenase, BMP, CBC, i-stat troponin, PT-INR, EKG, and BNP. Discussed treatment plan with pt at bedside and pt agreed to plan.      Labs Review Labs Reviewed  BASIC METABOLIC PANEL - Abnormal; Notable for the following:    Glucose, Bld 113 (*)    BUN 21 (*)    GFR calc non Af [name] 55 (*)    All other components within normal limits  CBC - Abnormal; Notable for the following:    RBC 3.36 (*)    Hemoglobin 10.8 (*)    HCT 32.0 (*)    RDW 16.8 (*)    All other components within normal limits  PROTIME-INR - Abnormal; Notable for the following:    Prothrombin Time 25.6 (*)    INR 2.36 (*)  All other components within normal limits  BRAIN NATRIURETIC PEPTIDE - Abnormal; Notable for the following:    B Natriuretic Peptide 638.3 (*)    All other components within normal limits  LACTATE DEHYDROGENASE - Abnormal; Notable for the following:     LDH 233 (*)    All other components within normal limits  I-STAT TROPOININ, ED  Randolm Idol, ED    Imaging Review Dg Chest 2 View  09/21/2015  CLINICAL DATA:  Acute onset of cough and congestion. Initial encounter. EXAM: CHEST  2 VIEW COMPARISON:  Chest radiograph performed 06/13/2015 FINDINGS: A small left pleural effusion is again seen. Mild vascular congestion is noted, with slightly increased interstitial markings, raising question for minimal interstitial edema. No pneumothorax is seen. The cardiomediastinal silhouette is borderline normal in size. The patient is status post median sternotomy. A left ventricular assist device is noted. A pacemaker/AICD is noted at the left chest wall, with leads ending at the right atrium and right ventricle. No acute osseous abnormalities are seen. IMPRESSION: Small left pleural effusion again noted. Mild vascular congestion, with slightly increased interstitial markings, raising question for minimal interstitial edema. Electronically Signed   By: Garald Balding M.D.   On: 09/21/2015 00:45   I have personally reviewed and evaluated these images and lab results as part of my medical decision-making.   EKG Interpretation   Date/Time:  Wednesday September 21 2015 00:24:10 EST Ventricular Rate:  70 PR Interval:  236 QRS Duration: 127 QT Interval:  462 QTC Calculation: 499 R Axis:   90 Text Interpretation:  Sinus rhythm Prolonged PR interval Nonspecific  intraventricular conduction delay Inferior infarct, old Probable  anterolateral infarct, old Confirmed by Lita Mains  MD, Kimyatta Lecy ([id number]) on  09/21/2015 2:16:20 AM      MDM   Final diagnoses:  SOB (shortness of breath)  Acute pulmonary edema (HCC)    I personally performed the services described in this documentation, which was scribed in my presence. The recorded information has been reviewed and is accurate.   Discussed with Dr. Aundra Dubin who reviewed the patient's labs and LVAD settings.  Recommended giving the patient 40 mg of Lasix IV and 20 mEq of potassium in the emergency department. Also recommended a 3 hour repeat troponin and discharge in the patient's if normal on 20 mg of Lasix daily. Follow-up in the office today.  Patient continues to be well-appearing. No apparent tenderness distress. Repeat troponin is normal. Discussed with patient and his wife the need to follow-up with cardiology. Return precautions have been given. Both patient and wife are in agreement with plan.  Julianne Rice, MD 09/21/15 [phone]

## 2015-09-22 ENCOUNTER — Ambulatory Visit (HOSPITAL_COMMUNITY)
Admission: RE | Admit: 2015-09-22 | Discharge: 2015-09-22 | Disposition: A | Discharge status: Home / Self Care | Payer: Medicare Other | Source: Ambulatory Visit | Attending: Internal Medicine | Admitting: Internal Medicine

## 2015-09-22 ENCOUNTER — Other Ambulatory Visit (HOSPITAL_COMMUNITY): Payer: Self-pay | Admitting: *Deleted

## 2015-09-22 ENCOUNTER — Other Ambulatory Visit (HOSPITAL_COMMUNITY): Payer: Self-pay | Admitting: Adult Health

## 2015-09-22 ENCOUNTER — Ambulatory Visit (HOSPITAL_COMMUNITY): Payer: Self-pay | Admitting: *Deleted

## 2015-09-22 VITALS — BP 80/0 | HR 69 | Ht 72.0 in | Wt 195.6 lb

## 2015-09-22 DIAGNOSIS — Z7901 Long term (current) use of anticoagulants: Secondary | ICD-10-CM

## 2015-09-22 DIAGNOSIS — I129 Hypertensive chronic kidney disease with stage 1 through stage 4 chronic kidney disease, or unspecified chronic kidney disease: Secondary | ICD-10-CM | POA: Diagnosis not present

## 2015-09-22 DIAGNOSIS — Z79899 Other long term (current) drug therapy: Secondary | ICD-10-CM | POA: Diagnosis not present

## 2015-09-22 DIAGNOSIS — R319 Hematuria, unspecified: Secondary | ICD-10-CM | POA: Insufficient documentation

## 2015-09-22 DIAGNOSIS — J069 Acute upper respiratory infection, unspecified: Secondary | ICD-10-CM

## 2015-09-22 DIAGNOSIS — Z95811 Presence of heart assist device: Secondary | ICD-10-CM

## 2015-09-22 DIAGNOSIS — N183 Chronic kidney disease, stage 3 (moderate): Secondary | ICD-10-CM | POA: Diagnosis not present

## 2015-09-22 DIAGNOSIS — R0600 Dyspnea, unspecified: Secondary | ICD-10-CM

## 2015-09-22 DIAGNOSIS — J209 Acute bronchitis, unspecified: Secondary | ICD-10-CM | POA: Diagnosis not present

## 2015-09-22 DIAGNOSIS — I4891 Unspecified atrial fibrillation: Secondary | ICD-10-CM | POA: Diagnosis not present

## 2015-09-22 DIAGNOSIS — I5022 Chronic systolic (congestive) heart failure: Secondary | ICD-10-CM | POA: Insufficient documentation

## 2015-09-22 DIAGNOSIS — I5023 Acute on chronic systolic (congestive) heart failure: Secondary | ICD-10-CM | POA: Diagnosis not present

## 2015-09-22 DIAGNOSIS — S32040G Wedge compression fracture of fourth lumbar vertebra, subsequent encounter for fracture with delayed healing: Secondary | ICD-10-CM

## 2015-09-22 LAB — BASIC METABOLIC PANEL
Anion gap: 8 (ref 5–15)
BUN: 21 mg/dL — ABNORMAL HIGH (ref 6–20)
CO2: 26 mmol/L (ref 22–32)
Calcium: 8.9 mg/dL (ref 8.9–10.3)
Chloride: 105 mmol/L (ref 101–111)
Creatinine, Ser: 1.39 mg/dL — ABNORMAL HIGH (ref 0.61–1.24)
GFR calc Af Amer: 56 mL/min — ABNORMAL LOW (ref >60)
GFR, EST NON AFRICAN AMERICAN: 48 mL/min — AB (ref >60)
Glucose, Bld: 97 mg/dL (ref 65–99)
POTASSIUM: 4.1 mmol/L (ref 3.5–5.1)
Sodium: 139 mmol/L (ref 135–145)

## 2015-09-22 LAB — URINALYSIS, ROUTINE W REFLEX MICROSCOPIC
Glucose, UA: NEGATIVE mg/dL
KETONES UR: 15 mg/dL — AB
NITRITE: POSITIVE — AB
PH: 5.5 (ref 5.0–8.0)
Protein, ur: 100 mg/dL — AB
Specific Gravity, Urine: 1.027 (ref 1.005–1.030)

## 2015-09-22 LAB — CBC
HEMATOCRIT: 34.7 % — AB (ref 39.0–52.0)
HEMOGLOBIN: 11.3 g/dL — AB (ref 13.0–17.0)
MCH: 31.2 pg (ref 26.0–34.0)
MCHC: 32.6 g/dL (ref 30.0–36.0)
MCV: 95.9 fL (ref 78.0–100.0)
Platelets: 200 10*3/uL (ref 150–400)
RBC: 3.62 MIL/uL — ABNORMAL LOW (ref 4.22–5.81)
RDW: 16.8 % — ABNORMAL HIGH (ref 11.5–15.5)
WBC: 7.5 10*3/uL (ref 4.0–10.5)

## 2015-09-22 LAB — URINE MICROSCOPIC-ADD ON

## 2015-09-22 LAB — LACTATE DEHYDROGENASE: LDH: 251 U/L — ABNORMAL HIGH (ref 98–192)

## 2015-09-22 LAB — PROTIME-INR
INR: 2.43 — AB (ref 0.00–1.49)
PROTHROMBIN TIME: 26.1 s — AB (ref 11.6–15.2)

## 2015-09-22 LAB — BRAIN NATRIURETIC PEPTIDE: B NATRIURETIC PEPTIDE 5: 707.6 pg/mL — AB (ref 0.0–100.0)

## 2015-09-22 MED ORDER — AMOXICILLIN-POT CLAVULANATE 875-125 MG PO TABS: 1.0000 | ORAL_TABLET | Freq: Two times a day (BID) | ORAL | Status: DC

## 2015-09-22 NOTE — Patient Instructions (Addendum)
1.  Start Augmentin one tablet twice daily for 14 days. 2.  Give Lasix 20 mg today and tomorrw.  3.  CT chest abdomen and pelvis to rule out kidney stone.  4.  Return at scheduled time for annual visit. Bring home equipment.  5.  Referral to Dr. Nelva Bush for pain management.

## 2015-09-22 NOTE — Progress Notes (Signed)
Symptom  Yes  No  Details   Angina       x Activity:   Claudication        x How far:   Syncope        x When:     Stroke        x        TIA  2001  Orthopnea         x How many pillows: 1  PND         x How often:  CPAP       N/A How many hrs:   Pedal edema        x        R>L baseline   Abd fullness               x    N&V               x Gagging episodes 1 - 2 x daily  Diaphoresis               x   Bleeding              x   Urine color        yellow  SOB        x      Activity: incline, no more than normal.   Palpitations        x When:   ICD shock        x   Hospitlizaitons                 x When/where/why:   ED visit         When/where/why:  Other MD              x When/who/why:    Activity        Off work due to injuries; "written out until furhter notice"  Fluid    No limitations (36 ozs day)  Diet    Low salt food   Vital signs: HR: 69 MAP BP:  80 Auto cuff:  105/64 (80) O2 Sat: 96 % Wt:  198.5 lbs Last wt: 196.3  lbs Home wts:  190 - 194  lbs Ht: 6'0"  LVAD interrogation reveals:  Speed: 9000 Flow: 5.0 Power: 5.2 PI: 7.1 Alarms: none Events:  Rare PI  Fixed speed:  9000  Low speed limit:  8400 Primary Controller:  Replace back up battery in 30 months  (Feb 2017) Back up controller:   Replace back up battery in 10 months (Feb 2017)  I reviewed the LVAD parameters from today and compared the results to the patient's prior recorded data.  LVAD interrogation revealed rare PI and no elevated powers. Pt denies any symptoms of dizziness, lightheadedness, or syncope.   ReDS vest reading 36.  Reviewed with Dr. Haroldine Laws and Darrick Grinder, NP.  UA obtained - results reviewed by Dr. Haroldine Laws.  Annual maintenance and 2 year Intermacs to be performed next OV.   Patient Instructions:  1.  Start Augmentin one tablet twice daily for 14 days. 2.  Give Lasix 20 mg today and tomorrw.  3.  CT chest abdomen and pelvis to rule out kidney stone.  4.  Return at scheduled time  for annual visit. Bring home equipment.  5.  Referral to Dr. Nelva Bush for pain management.     Zada Girt, RN VAD Coordinator  Office: [phone] 24/7 VAD Pager: [phone]

## 2015-09-23 ENCOUNTER — Telehealth (HOSPITAL_COMMUNITY): Payer: Self-pay | Admitting: *Deleted

## 2015-09-23 ENCOUNTER — Other Ambulatory Visit (HOSPITAL_COMMUNITY): Payer: Self-pay | Admitting: *Deleted

## 2015-09-23 ENCOUNTER — Ambulatory Visit (HOSPITAL_COMMUNITY)
Admission: RE | Admit: 2015-09-23 | Discharge: 2015-09-23 | Disposition: A | Discharge status: Home / Self Care | Payer: Medicare Other | Source: Ambulatory Visit | Attending: Internal Medicine | Admitting: Internal Medicine

## 2015-09-23 DIAGNOSIS — N281 Cyst of kidney, acquired: Secondary | ICD-10-CM | POA: Insufficient documentation

## 2015-09-23 DIAGNOSIS — J90 Pleural effusion, not elsewhere classified: Secondary | ICD-10-CM | POA: Diagnosis not present

## 2015-09-23 DIAGNOSIS — M545 Low back pain: Secondary | ICD-10-CM | POA: Insufficient documentation

## 2015-09-23 DIAGNOSIS — M4856XA Collapsed vertebra, not elsewhere classified, lumbar region, initial encounter for fracture: Secondary | ICD-10-CM | POA: Diagnosis not present

## 2015-09-23 DIAGNOSIS — J9811 Atelectasis: Secondary | ICD-10-CM | POA: Diagnosis not present

## 2015-09-23 DIAGNOSIS — R319 Hematuria, unspecified: Secondary | ICD-10-CM | POA: Diagnosis not present

## 2015-09-23 NOTE — Telephone Encounter (Signed)
Called pt and [name] per Dr. Haroldine Laws with CT results and recommendations:  1.  CT shows nonobstructing stones that could be associated with hematuria. Pt will need to f/u with Dr. Risa Grill (urology). Dr. Haroldine Laws spoke with Dr. Risa Grill and he will try to see [name] next week. [name] will call Dr. Cy Blamer office to      schedule appt.   2.   Worsening of a compression fracture at L4. Dr. Haroldine Laws spoke with Dr. Rush Farmer (orthopedics) and pt will need to be seen by Dr. Vertell Limber (neurosurgery). VAD coordinator contacted Dr. Leanne Lovely nurse with referral request, their office will contact patient.  Pt and [name] verbalized understanding of above.

## 2015-09-25 NOTE — Progress Notes (Signed)
Patient ID: [name], male   DOB: [birth year], 74 y.o.   MRN: [id number]   VAD CLINIC PROGRESS NOTE  PCP: Dr. Karolee Stamps ([place])  [name] is a 74 yo male with h/o VT, PAD and severe systolic HF (EF 0000000) due to mixed ischemic/nonischemic CM he is s/p HM II LVAD implant for destination therapy on October 19, 2013. On 09/01/14 underwent cystoscopy and flexible ureteroscopy with lithotripsy and basket extraction.   VAD implantation was complicated by VT, syncope, A fib/Aflutter and persistent low output felt to be due to RHF. RHC showed low cardiac output but no evidence of RV failure on Milrinone. He was felt to be volume depleted. He was discharged home on 11/18/13 on mirlinone and PRN lasix 20 mg for a weight of 200 pounds or greater. Milrinone has since been weaned off.   RHC (07/13/14): Well compensated hemodynamics with VAD support.  Admitted 11/9-11/19/15 for hematuria and chronic nephrolithiasis. Underwent Echo to assess RV for concern RV failure because he presented with SOB. RV looked good and had renal US showed mild hyronephrosis and bilateral renal cysts. Dr. Risa Grill took back for repeat lithotripsy and stone extraction by ureteroscopy and J stent placement. Received total of 3 PRBCs. D/C weight 188 lbs.   In 4/16 admitted with weakness and fall. In ER hemoglobin was found to be 5.4 with MAP 78.  While hospitalized he received 5U PRBCs. 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 Then readmitted with acute L4 compression fracture. Most recently admitted with weakness and low PI. Found to have recurrent atrial tach and underwent TEE/DCC on 03/02/15  Follow up LVAD:  He returns for followup.  Seen in the ER this week for SOB and cough. Improved with IV lasix but still with productive cough and occassional green sputum. No fevers and chills. Had episode of hematuria today which was the first in many months. No dysuria. Denies  flank pain but back pain from compression fracture getting worse. Edema improved. No orthopnea or PND. No GI bleeding    LVAD interrogation reveals:  Speed: 9000 Flow: 5.0 Power: 5.2 PI: 7.1 Alarms: none Events: Rare PI  Fixed speed: 9000  Low speed limit: 8400 Primary Controller: Replace back up battery in 30 months (Feb 2017) Back up controller: Replace back up battery in 10 months (Feb 2017)    Labs (10/16): Hgb 12.2 => 12.7, BNP 305, K 4.4, creatinine 1.46, LDL 264  ECG: NSR, 1st degree AV block, IVCD   Past Medical History  Diagnosis Date  . Bradycardia   . Ventricular tachycardia ([place])     s/p AICD;   h/o ICD shock;  Amiodarone Rx. S/P ablation in 2012  . PVD (peripheral vascular disease) ([place])     h/o claudication;  s/p R fem to pop BPG 3/11;  s/p L CFA BPG 7/03;  s/p repair of inf AAA 6/03;  s/p aorta to bilat renal BPG 6/03  . HTN (hypertension)   . HLD (hyperlipidemia)   . Cytopenia   . Hypothyroidism   . Renal insufficiency   . Claudication ([place])   . Chronic systolic heart failure ([place])   . Ischemic cardiomyopathy     echo 7/12:  EF 25-30%, mild AI, mod MR, mod LAE  . TIA (transient ischemic attack) 2001  . CVD (cerebrovascular disease)     left CEA 2008  . Stroke ([place])   . Anemia   . Inappropriate therapy from  implantable cardioverter-defibrillator 04/02/2013    ATP for sinus tach  SVT wavelet identified SVT but was no  passive   . Myocardial infarction ([place]) 1992  . Anginal pain ([place])   . Automatic implantable cardioverter-defibrillator in situ     Medtronic Evera device serial number [id number]    . Arthritis     "a little in my knees" (05/25/2013)  . 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 Dysrhythmia     HX A FIB  . History of nonmelanoma skin cancer     L EAR  . Kidney stone   . History of transfusion   . Sleep apnea     denies wearing mask (05/25/2013)  . CAD (coronary  artery disease)     s/p MI 1982;  s/p DES to CFX 2011;  s/p NSTEMI 4/12 in setting of VTach;  cath 7/12:  LM ok, LAD mid 30-40%, Dx's with 40%, mCFX stent patent, prox to mid RCA occluded with R to R and L to R collats.  Medical Tx was continued  . CHF (congestive heart failure) (HCC)     HAS VENTRICULAR PUMP    Current Outpatient Prescriptions  Medication Sig Dispense Refill  . acetaminophen (TYLENOL) 325 MG tablet Take 650 mg by mouth every 6 (six) hours as needed (pain).    [name] amLODipine (NORVASC) 10 MG tablet Take 10 mg by mouth daily.    [name] atorvastatin (LIPITOR) 80 MG tablet Take 80 mg by mouth daily.     [name] docusate sodium (COLACE) 100 MG capsule Take 200 mg by mouth daily.     . ferrous gluconate (FERGON) 324 MG tablet Take 1 tablet (324 mg total) by mouth 2 (two) times daily with a meal. 60 tablet 6  . furosemide (LASIX) 20 MG tablet Take 1 tablet (20 mg total) by mouth daily. 30 tablet 0  . Multiple Vitamin (MULTIVITAMIN WITH MINERALS) TABS tablet Take 1 tablet by mouth at bedtime.    . pantoprazole (PROTONIX) 40 MG tablet Take 1 tablet (40 mg total) by mouth 2 (two) times daily. 60 tablet 6  . RANEXA 500 MG 12 hr tablet TAKE 1 TABLET (500 MG TOTAL) BY MOUTH 2 (TWO) TIMES DAILY. 60 tablet 6  . warfarin (COUMADIN) 2 MG tablet As directed 50 tablet 6  . zinc gluconate 50 MG tablet Take 100 mg by mouth at bedtime.     [name] amoxicillin-clavulanate (AUGMENTIN) 875-125 MG tablet Take 1 tablet by mouth 2 (two) times daily. 28 tablet 0  . levothyroxine (SYNTHROID, LEVOTHROID) 200 MCG tablet Take one 200 mcg daily before breakfast every day with 1 1/2 (300 mcg) every Monday. 30 tablet 6  . levothyroxine (SYNTHROID, LEVOTHROID) 200 MCG tablet TAKE 1 TABLET BY MOUTH DAILY BEFORE BREAKFAST 30 tablet 6   No current facility-administered medications for this encounter.    Demerol  REVIEW OF SYSTEMS: All systems negative except as listed in HPI, PMH and Problem list.  LVAD interrogation reveals:    Speed:   9200  Flow: --- Power: 4.4 PI: 2.8 Alarms:  4 low flow alarms Events: 25 - 45 PI events daily Fixed speed:  9200  Low speed limit:  8600 Primary Controller:  Replace back up battery in 13 months  (Feb 2017) Back up controller:   Replace back up battery in 13 months (Feb 2017)    Vital signs: HR: 69 MAP BP: 80  Auto cuff: 105/64 (80) O2 Sat: 96 % Wt: 198.5 lbs Last wt: 196.3 lbs Home wts: 190 - 194 lbs Ht: 6'0"  Physical Exam: GENERAL: NAD HEENT: normal NECK: Supple, JVP 7-8, no carotid bruits.  No lymphadenopathy or thyromegaly appreciated.   CARDIAC:  Mechanical heart sounds with LVAD hum present.  LUNGS:  clear.  ABDOMEN:  Soft, round, nontender, positive bowel sounds x4.     LVAD exit site: well-healed and incorporated.  Dressing dry and intact.  No erythema or drainage.  Stabilization device present and accurately applied.  Driveline dressing is being changed weekly per sterile technique. EXTREMITIES:  Warm and dry, no cyanosis, clubbing, no edema.  NEUROLOGIC:  Alert and oriented x 4.  Gait steady.  No aphasia.  No dysarthria.  Affect pleasant.  ASSESSMENT AND PLAN:  1. Chronic systolic HF: s/p LVAD 123XX123.   - Remains NYHA II-III. - Recently had multiple PI events so we encouraged hydration and  VAD speed turned down to 9000.  - This week had acute HF and seen in ER. Responded to IV lasix but volume still up. ReDS = 36. BNP up. Will give lasix 20 mg today and tomorrow and encourage use as needed.  2. Acute bronchitis/sinusitis: - treat with augmentin 3. Atrial arrhythmias: Atrial fibrillation, atrial tachycardia.  s/p DC-CV 06/04/14 and repeat DC-CV 03/02/15.  Now off amio due to thyrotoxicity. Watch closely for recurrent AF, none seen on device interrogatio recently 4 HTN: MAP uok on amlodipine 5. LVAD: LVAD parameters reviewed personally. 6.CKD, stage III: BMET today.  7. Anticoagulation management: Goal INR 2-2.5. He is off ASA with GI bleeding.   8. Nephrolithiasis s/p Renal Stone lithotripsy: Per Dr Risa Grill. Had episode of hematuria today. UA without infection. Ct ab/pelvis with residual stones but no obstruction. I contacted Dr. Risa Grill who will see him soon. 9. 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 Hemoglobin stable today with no signs of acute bleeding.  10. Lumbar compression fracture: worse on CT today. I spoke with Dr. Ninfa [place] about possible kyphoplasty and he agreed. Will refer to [facility] for evaluation.    Glori Bickers MD 09/25/2015

## 2015-09-28 ENCOUNTER — Ambulatory Visit (HOSPITAL_COMMUNITY): Payer: Medicare Other | Admitting: *Deleted

## 2015-09-28 LAB — POCT INR: INR: 3.2

## 2015-10-04 ENCOUNTER — Other Ambulatory Visit: Payer: Self-pay | Admitting: Neurosurgery

## 2015-10-07 ENCOUNTER — Ambulatory Visit (HOSPITAL_COMMUNITY)
Admission: RE | Admit: 2015-10-07 | Discharge: 2015-10-07 | Disposition: A | Discharge status: Home / Self Care | Payer: Medicare Other | Source: Ambulatory Visit | Attending: Cardiology | Admitting: Cardiology

## 2015-10-07 ENCOUNTER — Ambulatory Visit (HOSPITAL_COMMUNITY): Payer: Self-pay | Admitting: Infectious Diseases

## 2015-10-07 ENCOUNTER — Encounter (HOSPITAL_COMMUNITY): Payer: Self-pay

## 2015-10-07 ENCOUNTER — Other Ambulatory Visit (HOSPITAL_COMMUNITY): Payer: Self-pay | Admitting: Infectious Diseases

## 2015-10-07 VITALS — BP 94/68 | HR 68 | Ht 72.0 in | Wt 195.6 lb

## 2015-10-07 DIAGNOSIS — I129 Hypertensive chronic kidney disease with stage 1 through stage 4 chronic kidney disease, or unspecified chronic kidney disease: Secondary | ICD-10-CM | POA: Insufficient documentation

## 2015-10-07 DIAGNOSIS — Z8673 Personal history of transient ischemic attack (TIA), and cerebral infarction without residual deficits: Secondary | ICD-10-CM | POA: Insufficient documentation

## 2015-10-07 DIAGNOSIS — Z7902 Long term (current) use of antithrombotics/antiplatelets: Secondary | ICD-10-CM | POA: Insufficient documentation

## 2015-10-07 DIAGNOSIS — N183 Chronic kidney disease, stage 3 (moderate): Secondary | ICD-10-CM | POA: Insufficient documentation

## 2015-10-07 DIAGNOSIS — E039 Hypothyroidism, unspecified: Secondary | ICD-10-CM | POA: Diagnosis not present

## 2015-10-07 DIAGNOSIS — M4856XA Collapsed vertebra, not elsewhere classified, lumbar region, initial encounter for fracture: Secondary | ICD-10-CM | POA: Insufficient documentation

## 2015-10-07 DIAGNOSIS — I5022 Chronic systolic (congestive) heart failure: Secondary | ICD-10-CM

## 2015-10-07 DIAGNOSIS — S32040B Wedge compression fracture of fourth lumbar vertebra, initial encounter for open fracture: Secondary | ICD-10-CM | POA: Diagnosis not present

## 2015-10-07 DIAGNOSIS — Z79899 Other long term (current) drug therapy: Secondary | ICD-10-CM | POA: Diagnosis not present

## 2015-10-07 DIAGNOSIS — I4891 Unspecified atrial fibrillation: Secondary | ICD-10-CM | POA: Diagnosis not present

## 2015-10-07 DIAGNOSIS — Z85828 Personal history of other malignant neoplasm of skin: Secondary | ICD-10-CM | POA: Insufficient documentation

## 2015-10-07 DIAGNOSIS — Z85820 Personal history of malignant melanoma of skin: Secondary | ICD-10-CM | POA: Diagnosis not present

## 2015-10-07 DIAGNOSIS — G4730 Sleep apnea, unspecified: Secondary | ICD-10-CM | POA: Insufficient documentation

## 2015-10-07 DIAGNOSIS — Z95811 Presence of heart assist device: Secondary | ICD-10-CM | POA: Diagnosis not present

## 2015-10-07 DIAGNOSIS — I255 Ischemic cardiomyopathy: Secondary | ICD-10-CM | POA: Insufficient documentation

## 2015-10-07 DIAGNOSIS — N200 Calculus of kidney: Secondary | ICD-10-CM | POA: Diagnosis not present

## 2015-10-07 DIAGNOSIS — R0609 Other forms of dyspnea: Secondary | ICD-10-CM

## 2015-10-07 DIAGNOSIS — Z95810 Presence of automatic (implantable) cardiac defibrillator: Secondary | ICD-10-CM | POA: Insufficient documentation

## 2015-10-07 DIAGNOSIS — E785 Hyperlipidemia, unspecified: Secondary | ICD-10-CM | POA: Insufficient documentation

## 2015-10-07 DIAGNOSIS — I739 Peripheral vascular disease, unspecified: Secondary | ICD-10-CM | POA: Diagnosis not present

## 2015-10-07 DIAGNOSIS — Z7901 Long term (current) use of anticoagulants: Secondary | ICD-10-CM | POA: Insufficient documentation

## 2015-10-07 DIAGNOSIS — R0600 Dyspnea, unspecified: Secondary | ICD-10-CM

## 2015-10-07 DIAGNOSIS — I2510 Atherosclerotic heart disease of native coronary artery without angina pectoris: Secondary | ICD-10-CM | POA: Insufficient documentation

## 2015-10-07 DIAGNOSIS — I252 Old myocardial infarction: Secondary | ICD-10-CM | POA: Insufficient documentation

## 2015-10-07 LAB — CBC
HEMATOCRIT: 35.4 % — AB (ref 39.0–52.0)
HEMOGLOBIN: 11.6 g/dL — AB (ref 13.0–17.0)
MCH: 31.4 pg (ref 26.0–34.0)
MCHC: 32.8 g/dL (ref 30.0–36.0)
MCV: 95.9 fL (ref 78.0–100.0)
Platelets: 164 10*3/uL (ref 150–400)
RBC: 3.69 MIL/uL — AB (ref 4.22–5.81)
RDW: 16.7 % — ABNORMAL HIGH (ref 11.5–15.5)
WBC: 4.6 10*3/uL (ref 4.0–10.5)

## 2015-10-07 LAB — COMPREHENSIVE METABOLIC PANEL
ALBUMIN: 3.6 g/dL (ref 3.5–5.0)
ALK PHOS: 106 U/L (ref 38–126)
ALT: 29 U/L (ref 17–63)
AST: 40 U/L (ref 15–41)
Anion gap: 6 (ref 5–15)
BILIRUBIN TOTAL: 0.6 mg/dL (ref 0.3–1.2)
BUN: 20 mg/dL (ref 6–20)
CALCIUM: 9 mg/dL (ref 8.9–10.3)
CO2: 26 mmol/L (ref 22–32)
Chloride: 106 mmol/L (ref 101–111)
Creatinine, Ser: 1.48 mg/dL — ABNORMAL HIGH (ref 0.61–1.24)
GFR calc Af Amer: 52 mL/min — ABNORMAL LOW (ref >60)
GFR, EST NON AFRICAN AMERICAN: 45 mL/min — AB (ref >60)
GLUCOSE: 101 mg/dL — AB (ref 65–99)
Potassium: 4.2 mmol/L (ref 3.5–5.1)
Sodium: 138 mmol/L (ref 135–145)
TOTAL PROTEIN: 6.5 g/dL (ref 6.5–8.1)

## 2015-10-07 LAB — LACTATE DEHYDROGENASE: LDH: 247 U/L — AB (ref 98–192)

## 2015-10-07 LAB — MAGNESIUM: Magnesium: 2.1 mg/dL (ref 1.7–2.4)

## 2015-10-07 LAB — BRAIN NATRIURETIC PEPTIDE: B NATRIURETIC PEPTIDE 5: 491.6 pg/mL — AB (ref 0.0–100.0)

## 2015-10-07 LAB — PROTIME-INR
INR: 1.63 — AB (ref 0.00–1.49)
Prothrombin Time: 19.3 seconds — ABNORMAL HIGH (ref 11.6–15.2)

## 2015-10-07 NOTE — Patient Instructions (Addendum)
1. Last dose of Warfarin on 10/20/15 and plan on admission for IV medication to thin your blood (heparin) so your INR can lower to safely perform the surgery on 10/25/15.   Pre-Op will be done as you have scheduled 10/19/15.   2. We will call you with your labs when they come back.   3. No medication changes this visit. Continue taking your lasix as needed based on your weights.

## 2015-10-07 NOTE — Progress Notes (Addendum)
Presents today with [name] (significant other).   Symptom  Yes  No  Details   Angina       x Activity:   Claudication        x How far:   Syncope        x When:     Stroke        x        TIA  2001  Orthopnea         x How many pillows: 1  PND         x How often:  CPAP       N/A How many hrs:   Pedal edema        x        R>L baseline + injury  Abd fullness               x    N&V               x  "gagging episodes" resolved with gabapentin  Diaphoresis               x   Bleeding              x   Urine color        yellow  SOB        x      Activity: incline, no more than normal.   Palpitations        x When:   ICD shock        x   Hospitlizaitons                 x When/where/why:   ED visit        x When/where/why:  Other MD              x When/who/why:   Activity        Off work due to injuries  Fluid    No limitations   Diet    Low salt food; Poor appetite   Vital signs: HR: 68 MAP BP: 74 Auto cuff:  O2 Sat:  97% Wt: 195.6  lbs Last wt: 205.2  lbs  Ht: 6'0"  LVAD interrogation reveals:  Speed:   9000 Flow: 3.4 - 4.0 Power: 4.4 PI: 4.2 - 6.2 (mostly ~4.5 - 5.0) Alarms: few low voltage advisories Events:  0 - 5 PI events several days during the week.  Fixed speed:  9000 Low speed limit:  8400 Primary Controller:  Replace back up battery in 29 months   Back up controller:   Replace back up battery in 9 months (March 2017)  LVAD interrogated and functioning as expected. Interrogation NEGATIVE for power spikes, and NEGATIVE for significant PI Events or speed drops. Improved on lower speed.   Driveline Site: Drive line site fully healed and incorporated. Velour is fully implanted at the site. They have adequate dressing supplies at home.   Encounter Details: Reports to clinic for 2 week follow up. Feeling better aside from his back pain. Surgery is scheduled for 10/25/15 with Dr. Vertell Limber to do L3-L5 fusion. Has not been taking lasix daily over concern of dehydration,  which has been his trend in the past. Had diarrhea from Augmentin and stopped after 10 days. Took one immodium and symptoms have resided. Sinusitus symptoms have improved. No further hematuria and Hgb stable today.   10 lb weight loss in the last 2 weeks--> reports to  have little to no appetite. [name] (SO) said she is giving him 2 boosts a day now to try to keep weight on. Counseled to really try to push himself to eat especially protein containing foods (nuts, cheese, eggs, etc.) as he is able especially with upcoming surgery in ~ 2 weeks.   Significant decrease in leg strength since falls. Walking about a half a mile a day (in several episodes). Requires a cane to walk. Unable to complete 6MW test today d/t intolerance for prolonged activity due to his back pain and leg weakness.   BNP 491 (down from 707 when he came in to ED needing IV diuresis). Fluid status today stable. Not dry but acceptable barring he has no shortness of breath at rest per Dr. Claris Gladden assessment.   INR 1.63 --> started Boost drinks BID from QD. See anticoag note.   LDH 247 --> stable within established baseline 220 - 300  Annual LVAD Patient Education Completed:  Reviewed and demonstrated the following to patient and caregiver:               Reviewed the steps for replacing the running system controller with the back-up system controller (see patient handbook section 2 or the appropriate pamphlet).             X  Demonstrated (using the mock-driveline and controller) how to connect and disconnect the mock-driveline in back up system controller in a timely manner (less than 10 seconds) with return demonstration by patient and caregiver.              X   Reviewed system controller alarms and troubleshooting including hazard and advisory alarms and accessing alarm history. Re-enforced NOT TO attempt to perform any task displayed on the display screen alone or without calling the VAD pager [phone] (see patient  handbook section 5 or the appropriate pamphlet).                       X  Reviewed how to handle an emergency including when the pump is running and when the pump has stopped (see patient handbook section 8). Call 911 first and then the VAD pager at [phone].             X   Reviewed 14-volt lithium ion battery calibration steps (see patient handbook section 3).            X   Reviewed contents of black bag and what must be with patient at all times.            X  Reviewed driveline exit site including cleansing, dressing, and immobilizing with an anchor device to prevent exit site trauma.             X  Reviewed weekly maintenance which includes: Reviewing "Replacing the [place] with a [employer]" pamphlet. Clean batteries, clips, and battery charger contacts.  Check cables for damages. Rotate batteries; keep all 8 charged.              X  Reviewed monthly maintenance which includes: Reviewing Alarms and Troubleshooting. Check battery manufacturer dates. Check use/charge cycles for each battery; remember to re-calibrate every 70 uses when prompted.              X  Additional pamphlets Guide to Replacing the [place] with the [place] for Patients and Their Caregivers and HM II Alarms for Patients and Their Caregivers given to patient.  X   Batteries Manufacture Date: Number of uses: Re-calibration  10/08/2013 174 - 176 Performed by patient   Annual maintenance completed today per [employer] on patient's home power module and Electrical engineer.    Backup system controller 11 volt battery charged during visit.    2 year Intermacs follow up completed including:  Quality of Life, KCCQ-12, and Neurocognitive trail making (41m 30s to complete).  Pt completed 0 feet during 6 minute walk due to the above mentioned leg weakness/back pain.  Plan for Pre Op testing 10/19/15. Hospital Admission 10/21/15 for heparin  bridge prior to surgery when INR <2.0. Last dose of coumadin will be 10/20/15 PM (5d prior to procedure). With his reduced goal 2 - 2.5 hopefully it will not be long for INR to drift down to <1.5 as requested from neurosurgery.   RTC to be scheduled after back surgery 10/25/15  Janene Madeira, , BSN, RN VAD Coordinator   Office: [phone] 24/7 VAD Pager: [phone]  Patient ID: [name], male   DOB: [birth year], 74 y.o.   MRN: [id number]   VAD CLINIC PROGRESS NOTE  PCP: Dr. Kandice Robinsons ([place])  [name] is a 74 yo male with h/o VT, PAD and severe systolic HF (EF 0000000) due to mixed ischemic/nonischemic CM he is s/p HM II LVAD implant for destination therapy on October 19, 2013. On 09/01/14 underwent cystoscopy and flexible ureteroscopy with lithotripsy and basket extraction.   VAD implantation was complicated by VT, syncope, A fib/Aflutter and persistent low output felt to be due to RHF. RHC showed low cardiac output but no evidence of RV failure on Milrinone. He was felt to be volume depleted. He was discharged home on 11/18/13 on mirlinone and PRN lasix 20 mg for a weight of 200 pounds or greater. Milrinone has since been weaned off.   RHC (07/13/14): Well compensated hemodynamics with VAD support.  Admitted 11/9-11/19/15 for hematuria and chronic nephrolithiasis. Underwent Echo to assess RV for concern RV failure because he presented with SOB. RV looked good and had renal US showed mild hyronephrosis and bilateral renal cysts. Dr. Risa Grill took back for repeat lithotripsy and stone extraction by ureteroscopy and J stent placement. Received total of 3 PRBCs. D/C weight 188 lbs.   In 4/16 admitted with weakness and fall. In ER hemoglobin was found to be 5.4 with MAP 78.  While hospitalized he received 5U PRBCs. 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 Then readmitted with acute L4 compression fracture. Most recently admitted with weakness and  low PI. Found to have recurrent atrial tach and underwent TEE/DCCV on 03/02/15.  Follow up LVAD:  He returns for followup.  Walking 1/2 mile/day total without dyspnea.  No lightheadedness.  Main problem remains low back pain from L-spine compression fracture.  He has seen Dr. Vertell Limber and needs L-spine surgery.   LVAD interrogation reveals:  See LVAD nurse's note above.  Labs (10/16): Hgb 12.2 => 12.7, BNP 305, K 4.4, creatinine 1.46, LDL 264   Past Medical History  Diagnosis Date  . Bradycardia   . Ventricular tachycardia ([place])     s/p AICD;   h/o ICD shock;  Amiodarone Rx. S/P ablation in 2012  . PVD (peripheral vascular disease) ([place])     h/o claudication;  s/p R fem to pop BPG 3/11;  s/p L CFA BPG 7/03;  s/p repair of inf AAA 6/03;  s/p aorta to bilat renal BPG 6/03  .  HTN (hypertension)   . HLD (hyperlipidemia)   . Cytopenia   . Hypothyroidism   . Renal insufficiency   . Claudication ([place])   . Chronic systolic heart failure ([place])   . Ischemic cardiomyopathy     echo 7/12:  EF 25-30%, mild AI, mod MR, mod LAE  . TIA (transient ischemic attack) 2001  . CVD (cerebrovascular disease)     left CEA 2008  . Stroke ([place])   . Anemia   . Inappropriate therapy from implantable cardioverter-defibrillator 04/02/2013    ATP for sinus tach  SVT wavelet identified SVT but was no  passive   . Myocardial infarction ([place]) 1992  . Anginal pain ([place])   . Automatic implantable cardioverter-defibrillator in situ     Medtronic Evera device serial number [id number]    . Arthritis     "a little in my knees" (05/25/2013)  . 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 Dysrhythmia     HX A FIB  . History of nonmelanoma skin cancer     L EAR  . Kidney stone   . History of transfusion   . Sleep apnea     denies wearing mask (05/25/2013)  . CAD (coronary artery disease)     s/p MI 1982;  s/p DES to CFX 2011;  s/p NSTEMI 4/12 in setting of  VTach;  cath 7/12:  LM ok, LAD mid 30-40%, Dx's with 40%, mCFX stent patent, prox to mid RCA occluded with R to R and L to R collats.  Medical Tx was continued  . CHF (congestive heart failure) (HCC)     HAS VENTRICULAR PUMP    Current Outpatient Prescriptions  Medication Sig Dispense Refill  . acetaminophen (TYLENOL) 325 MG tablet Take 650 mg by mouth every 6 (six) hours as needed (pain).    [name] amLODipine (NORVASC) 10 MG tablet Take 10 mg by mouth daily.    [name] atorvastatin (LIPITOR) 80 MG tablet Take 80 mg by mouth daily.     [name] docusate sodium (COLACE) 100 MG capsule Take 200 mg by mouth daily.     . ferrous gluconate (FERGON) 324 MG tablet Take 1 tablet (324 mg total) by mouth 2 (two) times daily with a meal. 60 tablet 6  . furosemide (LASIX) 20 MG tablet Take 1 tablet (20 mg total) by mouth daily. 30 tablet 0  . levothyroxine (SYNTHROID, LEVOTHROID) 200 MCG tablet Take one 200 mcg daily before breakfast every day with 1 1/2 (300 mcg) every Monday. 30 tablet 6  . Multiple Vitamin (MULTIVITAMIN WITH MINERALS) TABS tablet Take 1 tablet by mouth at bedtime.    . pantoprazole (PROTONIX) 40 MG tablet Take 1 tablet (40 mg total) by mouth 2 (two) times daily. 60 tablet 6  . RANEXA 500 MG 12 hr tablet TAKE 1 TABLET (500 MG TOTAL) BY MOUTH 2 (TWO) TIMES DAILY. 60 tablet 6  . warfarin (COUMADIN) 2 MG tablet As directed 50 tablet 6  . zinc gluconate 50 MG tablet Take 100 mg by mouth at bedtime.      No current facility-administered medications for this encounter.    Demerol  REVIEW OF SYSTEMS: All systems negative except as listed in HPI, PMH and Problem list.  BP 94/68 mmHg  Pulse 68  Ht 6' (1.829 m)  Wt 195 lb 9.6 oz (88.724 kg)  BMI 26.52 kg/m2  SpO2 98% Physical Exam: GENERAL:  NAD HEENT: normal NECK: Supple, JVP 7, no carotid bruits.  No lymphadenopathy or thyromegaly appreciated.   CARDIAC:  Mechanical heart sounds with LVAD hum present.  LUNGS:  clear.  ABDOMEN:  Soft, round,  nontender, positive bowel sounds x4.     LVAD exit site: well-healed and incorporated.  Dressing dry and intact.  No erythema or drainage.  Stabilization device present and accurately applied.  Driveline dressing is being changed weekly per sterile technique. EXTREMITIES:  Warm and dry, no cyanosis, clubbing, no edema.  NEUROLOGIC:  Alert and oriented x 4.  Gait steady.  No aphasia.  No dysarthria.  Affect pleasant.  ASSESSMENT AND PLAN:  1. Chronic systolic HF: s/p LVAD 123XX123.  Remains NYHA II-III.  Staying hydrated, rare PI events.  - Continue Lasix prn.  2.  Atrial arrhythmias: Atrial fibrillation, atrial tachycardia.  S/p DC-CV 06/04/14 and repeat DC-CV 03/02/15.  Now off amio due to thyrotoxicity. Watch closely for recurrent AF, none seen on device interrogation recently. 3. HTN: MAP ok on amlodipine. 4. LVAD: LVAD parameters reviewed personally, stable. 5.CKD, stage III: BMET today.  6. Anticoagulation management: Goal INR 2-2.5. He is off ASA with GI bleeding.  7. Nephrolithiasis s/p Renal Stone lithotripsy: Per Dr Risa Grill. Stable. 8. 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 Pending CBC today.  9. Lumbar compression fracture: He will need surgery with Dr. Vertell Limber.  Scheduled for 12/20, will be admitted 12/16 for heparin bridging off coumadin.    Loralie Champagne MD 10/08/2015

## 2015-10-08 NOTE — H&P (Signed)
Patient ID:   [id number] Patient: [name]  Date of Birth: 1941/08/26 Visit Type: Office Visit   Date: 09/26/2015 12:00 PM Provider: Marchia Meiers. Vertell Limber MD   This 74 year old male presents for back pain.  History of Present Illness: 1.  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  He states initial injury was February 11, 2015 after which he healed and return to work.  Unfortunately on his first day back at work, June 13, 2015, he fell backwards down a flight of stairs.  Back pain prompted imaging revealing worsening fracture.  Tylenol only as needed  History: Heart pump (LVAD) two years ago, HTN, MI, brainstem stroke 2000, COPD Surgical history: Listed in Epic  Lumbar CT on [place]  I reviewed the patient's CT scan of the lumbar spine which shows a healed L4 compression fracture with necrosis of the superior aspect of the vertebra including the superior endplate.  The patient is currently describing 6 out of 10 pain and says that this is persisting since April.  He says he can no longer do anything.  He has had a LVAD for the last 2 years and has been doing well with this.  He is on Coumadin for chronic anticoagulation for his left ventricular assist device.  I reviewed the patient's imaging studies and explained to him that there would be no option of kyphoplasty procedure given the sclerotic margin and absent superior aspect of the vertebra.  I told him that if he wants to get some relief this would likely require posterior lumbar fusion with implanted hardware without interbody fusion.  The patient feels that he is unable to tolerate his current level of discomfort in the deep does want to proceed with surgery.        PAST MEDICAL/SURGICAL HISTORY   (Detailed)  Disease/disorder Onset Date Management Date Comments  High cholesterol      Hypertension      Thyroid disease         PAST MEDICAL HISTORY, SURGICAL  HISTORY, FAMILY HISTORY, SOCIAL HISTORY AND REVIEW OF SYSTEMS I have reviewed the patient's past medical, surgical, family and social history as well as the comprehensive review of systems as included on the [facility] history form dated 09/26/2015, which I have signed.  Family History  (Detailed) Relationship Family Member Name Deceased Age at Death Condition Onset Age Cause of Death      Family history of Hypertension  N  Father    Stroke  N  Mother    Congestive heart failure  N    SOCIAL HISTORY  (Detailed) Tobacco use reviewed. Preferred language is Unknown.   Smoking status: Former smoker.  SMOKING STATUS Use Status Type Smoking Status Usage Per Day Years Used Total Pack Years  yes  Former smoker       HOME ENVIRONMENT/SAFETY  The Patient has fallen 2 times in the last year.  The fall(s) resulted in injury.  Details: fracture.       MEDICATIONS(added, continued or stopped this visit): Started Medication Directions Instruction Stopped   amlodipine 5 mg tablet take 1 tablet by oral route  every day     atorvastatin 80 mg tablet take 1 tablet by oral route  every day     Colace take 1 capsule by oral route 2 times every day     ICaps Take twice daily     iron take 1 tablet  by oral route 2 times every day     levothyroxine 200 mcg tablet take 1 tablet by oral route  every day     multivitamin Take once daily     pantoprazole 40 mg tablet,delayed release take 1 tablet by oral route 2 times every day     Ranexa 500 mg tablet,extended release take 1 tablet by oral route 2 times every day     warfarin Take 4mg  on Tuesday and Thursday and 2mg  all other days     zinc 50 mg tablet Take twice daily       ALLERGIES: Ingredient Reaction Medication Name Comment  MEPERIDINE HCL  DEMEROL    Reviewed, updated.   REVIEW OF SYSTEMS System Neg/Pos Details  Constitutional Negative Chills, fatigue, fever, malaise, night sweats, weight gain and weight  loss.  ENMT Negative Ear drainage, hearing loss, nasal drainage, otalgia, sinus pressure and sore throat.  Eyes Negative Eye discharge, eye pain and vision changes.  Respiratory Negative Chronic cough, cough, dyspnea, known TB exposure and wheezing.  Cardio Positive LVAD.  GI Negative Abdominal pain, blood in stool, change in stool pattern, constipation, decreased appetite, diarrhea, heartburn, nausea and vomiting.  GU Negative Dribbling, dysuria, erectile dysfunction, hematuria, polyuria, slow stream, urinary frequency, urinary incontinence and urinary retention.  Endocrine Negative Cold intolerance, heat intolerance, polydipsia and polyphagia.  Neuro Negative Dizziness, extremity weakness, gait disturbance, headache, memory impairment, numbness in extremity, seizures and tremors.  Psych Negative Anxiety, depression and insomnia.  Integumentary Negative Brittle hair, brittle nails, change in shape/size of mole(s), hair loss, hirsutism, hives, pruritus, rash and skin lesion.  MS Positive Back pain.  Hema/Lymph Negative Easy bleeding, easy bruising and lymphadenopathy.  Allergic/Immuno Negative Contact allergy, environmental allergies, food allergies and seasonal allergies.  Reproductive Negative Penile discharge and sexual dysfunction.     Vitals Date Temp F BP Pulse Ht In Wt Lb BMI BSA Pain Score  09/26/2015    72 197 26.72  6/10     PHYSICAL EXAM General Level of Distress: no acute distress Overall Appearance: normal    Cardiovascular Cardiac: distant heart sounds appreciated over the atria with a continuous smooth electrical rumble   auscultated most loudly adjacent to the LVAD  Respiratory Lungs: clear to auscultation  Neurological Recent and Remote Memory: normal Attention Span and Concentration:   normal Language: normal Fund of Knowledge: normal  Right Left Sensation: normal normal Upper Extremity Coordination: normal normal  Lower Extremity  Coordination: normal normal  Musculoskeletal Gait and Station: normal  Right Left Upper Extremity Muscle Strength: normal normal Lower Extremity Muscle Strength: normal normal Upper Extremity Muscle Tone:  normal normal Lower Extremity Muscle Tone: normal normal  Motor Strength Upper and lower extremity motor strength was tested in the clinically pertinent muscles.     Deep Tendon Reflexes  Right Left Biceps: normal normal Triceps: normal normal Brachiloradialis: normal normal Patellar: normal normal Achilles: normal normal  Sensory Sensation was tested at L1 to S1.   Cranial Nerves II. Optic Nerve/Visual Fields: normal III. Oculomotor: normal IV. Trochlear: normal V. Trigeminal: normal VI. Abducens: normal VII. Facial: normal VIII. Acoustic/Vestibular: normal IX. Glossopharyngeal: normal X. Vagus: normal XI. Spinal Accessory: normal XII. Hypoglossal: normal  Motor and other Tests Lhermittes: negative Rhomberg: negative    Right Left Hoffman's: normal normal Clonus: normal normal Babinski: normal normal SLR: negative negative Patrick's Corky Sox): negative negative Toe Walk: normal normal Toe Lift: normal normal Heel Walk: normal normal SI Joint: nontender nontender   Additional Findings:  Mechanical low back pain just above the lumbosacral junction with limitations in forward bending and extension secondary to back discomfort.    IMPRESSION Patient is on chronic anticoagulation with left ventricular assist device.  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  Completed Orders (this encounter) Order Details Reason Side Interpretation Result Initial Treatment Date Region  Lifestyle education regarding diet Encouraged to eat a well balanced diet and follow up with primary care physician.         Assessment/Plan # Detail Type Description   1. Assessment Lumbar compression fracture, closed, initial encounter (S32.000A).        2. Assessment Low back pain, unspecified back pain laterality, with sciatica presence unspecified (M54.5).       3. Assessment LVAD (left ventricular assist device) present (Z95.811).       4. Assessment Body mass index (BMI) 26.0-26.9, adult [place]:[id number]).   Plan Orders Today's instructions / counseling include(s) Lifestyle education regarding diet.         Pain Assessment/Treatment Pain Scale: 6/10. Method: Numeric Pain Intensity Scale. Location: back. Onset: 02/11/2015. Duration: varies. Quality: discomforting. Pain Assessment/Treatment follow-up plan of care: Patient is taking medications as prescribed..  Fall Risk Plan The Patient has fallen 2 times in the last year. The fall(s) resulted in injury. Details: fracture.  I recommended to patient he undergo posterior fusion L3 through L5 levels with implanted hardware and no interbody grafting.  I called Dr. Haroldine Laws, his cardiologist, to discuss the situation and my recommendations.  He felt that he could admit him and him off Coumadin and switch to heparin and that this would be very reasonable and that he would be a reasonable candidate for surgery.  He also explained that the LVAD coordinator, Jilda Roche, [phone], we'll be able to come to the operating room and assist with positioning to make sure we did not cause any problems with his ventricular assist device.  She would also be available to coordinate perioperative care and anticoagulation issues.  Orders: Diagnostic Procedures: Assessment Procedure  S32.000A Lumbar Fusion - L3 - L4 - L5 (no interbodies)  Instruction(s)/Education: Assessment Instruction  [phone] Lifestyle education regarding diet             Provider:  Marchia Meiers. Vertell Limber MD  10/02/2015 01:40 PM Dictation edited by: Marchia Meiers. Vertell Limber    CC Providers: Shaune Pascal Bensimhon [address] [place], [place] [address]-              Electronically signed by Marchia Meiers Vertell Limber MD on  10/02/2015 01:41 PM

## 2015-10-13 ENCOUNTER — Ambulatory Visit (HOSPITAL_COMMUNITY): Payer: Medicare Other | Admitting: Infectious Diseases

## 2015-10-13 LAB — POCT INR: INR: 2.1

## 2015-10-14 ENCOUNTER — Other Ambulatory Visit: Payer: Self-pay | Admitting: Cardiology

## 2015-10-14 ENCOUNTER — Other Ambulatory Visit (HOSPITAL_COMMUNITY): Payer: Self-pay | Admitting: Internal Medicine

## 2015-10-14 DIAGNOSIS — Z95811 Presence of heart assist device: Secondary | ICD-10-CM

## 2015-10-14 DIAGNOSIS — Z7901 Long term (current) use of anticoagulants: Secondary | ICD-10-CM

## 2015-10-19 ENCOUNTER — Inpatient Hospital Stay (HOSPITAL_COMMUNITY)
Admission: RE | Admit: 2015-10-19 | Discharge: 2015-10-19 | Disposition: A | Discharge status: Home / Self Care | Payer: Medicare Other | Source: Ambulatory Visit

## 2015-10-19 ENCOUNTER — Other Ambulatory Visit (HOSPITAL_COMMUNITY): Payer: Self-pay | Admitting: *Deleted

## 2015-10-20 ENCOUNTER — Encounter: Payer: Self-pay | Admitting: Licensed Clinical Social Worker

## 2015-10-20 ENCOUNTER — Ambulatory Visit (HOSPITAL_COMMUNITY): Payer: Medicare Other | Admitting: Infectious Diseases

## 2015-10-20 ENCOUNTER — Ambulatory Visit (HOSPITAL_COMMUNITY)
Admission: RE | Admit: 2015-10-20 | Discharge: 2015-10-20 | Disposition: A | Discharge status: Home / Self Care | Payer: Medicare Other | Source: Ambulatory Visit | Attending: Internal Medicine | Admitting: Internal Medicine

## 2015-10-20 DIAGNOSIS — Z09 Encounter for follow-up examination after completed treatment for conditions other than malignant neoplasm: Secondary | ICD-10-CM | POA: Insufficient documentation

## 2015-10-20 DIAGNOSIS — I5022 Chronic systolic (congestive) heart failure: Secondary | ICD-10-CM | POA: Diagnosis present

## 2015-10-20 DIAGNOSIS — Z95811 Presence of heart assist device: Secondary | ICD-10-CM | POA: Insufficient documentation

## 2015-10-20 DIAGNOSIS — T829XXA Unspecified complication of cardiac and vascular prosthetic device, implant and graft, initial encounter: Secondary | ICD-10-CM

## 2015-10-20 LAB — POCT INR: INR: 1.63

## 2015-10-20 NOTE — Progress Notes (Signed)
LVAD Coordinator Encounter Note:  Called VAD pager this morning to report a controller change out after receiving a "NO EXTERNAL POWER ALARM" despite being hooked up to battery. Reported that he was attaching himself to batteries at the time but the leads were attached. They elected to change the controller prior to their page. Reports that during the exchange he became dizzy and pre-syncopal (which he responded the same way with the controller change out from 08/03/15 as well).  LVAD Interrogation of Current Live Controller: Speed: 9000 Flow: 4.7 PI: 8.1 Power: 5.8w  Alarms: none Events: none recorded since exchange  Fixed Speed: 9000 Back up Speed: 8400   Old Controller Interrogation Revealed: NO EXTERNAL POWER alarm. Alarm started at 06:09 am while attached to batteries after he just switched from power module. Patient and caregiver report changing back to power module after alarm sounded--alarm persisted despite making reported adequate connections and verified. Controller [phone]) was exchanged at 6:11 with [employer] disconnect alarm reportedly triggered before exchange.          Discussed with [facility] and their official report detailed:  During the analysis of the log we observed the patient had a no external power alarm due to the voltages dropping below the threshold voltage while on power module, after changing over to his batteries the alarm continued so the patient changed out his controller to his backup controller the alarm stopped. Please contact us with serial or lot numbers of components that you have replaced so that we can issue a return number for them.  --Leane Platt, [facility] Engineer  Controller under warranty and new controller [phone] (Exp: 09-04-18) with new 11V battery [id number] (Exp: 05/05/17) was given to the patient as a back up controller.   Change 11V in 32 months.   Maintain scheduled appointment with VAD team and call as needed.   Janene Madeira, RN VAD  Coordinator   Office: [phone] 24/7 VAD Pager: [phone]

## 2015-10-26 ENCOUNTER — Other Ambulatory Visit (HOSPITAL_COMMUNITY): Payer: Self-pay | Admitting: Infectious Diseases

## 2015-10-26 DIAGNOSIS — E039 Hypothyroidism, unspecified: Secondary | ICD-10-CM

## 2015-10-26 MED ORDER — LEVOTHYROXINE SODIUM 200 MCG PO TABS: ORAL_TABLET | ORAL | Status: DC

## 2015-10-27 ENCOUNTER — Encounter (HOSPITAL_COMMUNITY): Payer: Self-pay | Admitting: Infectious Diseases

## 2015-10-27 ENCOUNTER — Ambulatory Visit (HOSPITAL_COMMUNITY): Payer: Medicare Other | Admitting: Infectious Diseases

## 2015-10-27 LAB — POCT INR: INR: 2.4

## 2015-10-29 DIAGNOSIS — T829XXA Unspecified complication of cardiac and vascular prosthetic device, implant and graft, initial encounter: Secondary | ICD-10-CM | POA: Insufficient documentation

## 2015-11-03 ENCOUNTER — Ambulatory Visit (HOSPITAL_COMMUNITY): Payer: Medicare Other | Admitting: *Deleted

## 2015-11-03 LAB — POCT INR: INR: 2.3

## 2015-11-04 ENCOUNTER — Telehealth (HOSPITAL_COMMUNITY): Payer: Self-pay | Admitting: *Deleted

## 2015-11-04 ENCOUNTER — Inpatient Hospital Stay (HOSPITAL_COMMUNITY)
Admission: EM | Admit: 2015-11-04 | Discharge: 2015-11-07 | DRG: 065 | Disposition: A | Discharge status: Home / Self Care | Payer: Medicare Other | Attending: Cardiology | Admitting: Cardiology

## 2015-11-04 ENCOUNTER — Emergency Department (HOSPITAL_COMMUNITY): Payer: Medicare Other

## 2015-11-04 ENCOUNTER — Encounter (HOSPITAL_COMMUNITY): Payer: Self-pay | Admitting: Neurology

## 2015-11-04 DIAGNOSIS — I10 Essential (primary) hypertension: Secondary | ICD-10-CM | POA: Diagnosis present

## 2015-11-04 DIAGNOSIS — I739 Peripheral vascular disease, unspecified: Secondary | ICD-10-CM | POA: Diagnosis present

## 2015-11-04 DIAGNOSIS — G459 Transient cerebral ischemic attack, unspecified: Secondary | ICD-10-CM

## 2015-11-04 DIAGNOSIS — Z95811 Presence of heart assist device: Secondary | ICD-10-CM

## 2015-11-04 DIAGNOSIS — Z9049 Acquired absence of other specified parts of digestive tract: Secondary | ICD-10-CM

## 2015-11-04 DIAGNOSIS — I6340 Cerebral infarction due to embolism of unspecified cerebral artery: Secondary | ICD-10-CM | POA: Diagnosis present

## 2015-11-04 DIAGNOSIS — E039 Hypothyroidism, unspecified: Secondary | ICD-10-CM | POA: Diagnosis present

## 2015-11-04 DIAGNOSIS — I639 Cerebral infarction, unspecified: Secondary | ICD-10-CM | POA: Diagnosis present

## 2015-11-04 DIAGNOSIS — Z9842 Cataract extraction status, left eye: Secondary | ICD-10-CM | POA: Diagnosis not present

## 2015-11-04 DIAGNOSIS — I480 Paroxysmal atrial fibrillation: Secondary | ICD-10-CM | POA: Diagnosis present

## 2015-11-04 DIAGNOSIS — I6300 Cerebral infarction due to thrombosis of unspecified precerebral artery: Secondary | ICD-10-CM

## 2015-11-04 DIAGNOSIS — Z8249 Family history of ischemic heart disease and other diseases of the circulatory system: Secondary | ICD-10-CM

## 2015-11-04 DIAGNOSIS — I471 Supraventricular tachycardia: Secondary | ICD-10-CM | POA: Diagnosis present

## 2015-11-04 DIAGNOSIS — Z87891 Personal history of nicotine dependence: Secondary | ICD-10-CM

## 2015-11-04 DIAGNOSIS — N183 Chronic kidney disease, stage 3 (moderate): Secondary | ICD-10-CM | POA: Diagnosis present

## 2015-11-04 DIAGNOSIS — Z961 Presence of intraocular lens: Secondary | ICD-10-CM | POA: Diagnosis present

## 2015-11-04 DIAGNOSIS — Z9841 Cataract extraction status, right eye: Secondary | ICD-10-CM

## 2015-11-04 DIAGNOSIS — Z888 Allergy status to other drugs, medicaments and biological substances status: Secondary | ICD-10-CM

## 2015-11-04 DIAGNOSIS — I2510 Atherosclerotic heart disease of native coronary artery without angina pectoris: Secondary | ICD-10-CM | POA: Diagnosis present

## 2015-11-04 DIAGNOSIS — M4856XA Collapsed vertebra, not elsewhere classified, lumbar region, initial encounter for fracture: Secondary | ICD-10-CM | POA: Diagnosis present

## 2015-11-04 DIAGNOSIS — Z87442 Personal history of urinary calculi: Secondary | ICD-10-CM

## 2015-11-04 DIAGNOSIS — I255 Ischemic cardiomyopathy: Secondary | ICD-10-CM | POA: Insufficient documentation

## 2015-11-04 DIAGNOSIS — Z823 Family history of stroke: Secondary | ICD-10-CM | POA: Diagnosis not present

## 2015-11-04 DIAGNOSIS — D649 Anemia, unspecified: Secondary | ICD-10-CM | POA: Diagnosis present

## 2015-11-04 DIAGNOSIS — E785 Hyperlipidemia, unspecified: Secondary | ICD-10-CM | POA: Diagnosis present

## 2015-11-04 DIAGNOSIS — Z95810 Presence of automatic (implantable) cardiac defibrillator: Secondary | ICD-10-CM

## 2015-11-04 DIAGNOSIS — G8194 Hemiplegia, unspecified affecting left nondominant side: Secondary | ICD-10-CM | POA: Diagnosis present

## 2015-11-04 DIAGNOSIS — I6529 Occlusion and stenosis of unspecified carotid artery: Secondary | ICD-10-CM | POA: Insufficient documentation

## 2015-11-04 DIAGNOSIS — Z8673 Personal history of transient ischemic attack (TIA), and cerebral infarction without residual deficits: Secondary | ICD-10-CM

## 2015-11-04 DIAGNOSIS — Z23 Encounter for immunization: Secondary | ICD-10-CM | POA: Diagnosis not present

## 2015-11-04 DIAGNOSIS — H818X2 Other disorders of vestibular function, left ear: Secondary | ICD-10-CM

## 2015-11-04 DIAGNOSIS — I63131 Cerebral infarction due to embolism of right carotid artery: Secondary | ICD-10-CM | POA: Diagnosis not present

## 2015-11-04 DIAGNOSIS — I638 Other cerebral infarction: Secondary | ICD-10-CM | POA: Diagnosis not present

## 2015-11-04 DIAGNOSIS — I252 Old myocardial infarction: Secondary | ICD-10-CM | POA: Diagnosis not present

## 2015-11-04 DIAGNOSIS — R9082 White matter disease, unspecified: Secondary | ICD-10-CM | POA: Diagnosis present

## 2015-11-04 DIAGNOSIS — G4730 Sleep apnea, unspecified: Secondary | ICD-10-CM | POA: Diagnosis present

## 2015-11-04 DIAGNOSIS — I6789 Other cerebrovascular disease: Secondary | ICD-10-CM | POA: Diagnosis not present

## 2015-11-04 DIAGNOSIS — Z85820 Personal history of malignant melanoma of skin: Secondary | ICD-10-CM | POA: Diagnosis not present

## 2015-11-04 DIAGNOSIS — Z79899 Other long term (current) drug therapy: Secondary | ICD-10-CM

## 2015-11-04 DIAGNOSIS — Z7901 Long term (current) use of anticoagulants: Secondary | ICD-10-CM | POA: Diagnosis not present

## 2015-11-04 DIAGNOSIS — Z9889 Other specified postprocedural states: Secondary | ICD-10-CM | POA: Insufficient documentation

## 2015-11-04 DIAGNOSIS — I130 Hypertensive heart and chronic kidney disease with heart failure and stage 1 through stage 4 chronic kidney disease, or unspecified chronic kidney disease: Secondary | ICD-10-CM | POA: Diagnosis present

## 2015-11-04 DIAGNOSIS — Z955 Presence of coronary angioplasty implant and graft: Secondary | ICD-10-CM | POA: Diagnosis not present

## 2015-11-04 DIAGNOSIS — I6521 Occlusion and stenosis of right carotid artery: Secondary | ICD-10-CM | POA: Diagnosis present

## 2015-11-04 DIAGNOSIS — I5022 Chronic systolic (congestive) heart failure: Secondary | ICD-10-CM | POA: Diagnosis present

## 2015-11-04 DIAGNOSIS — M1990 Unspecified osteoarthritis, unspecified site: Secondary | ICD-10-CM | POA: Diagnosis present

## 2015-11-04 DIAGNOSIS — Z8719 Personal history of other diseases of the digestive system: Secondary | ICD-10-CM

## 2015-11-04 DIAGNOSIS — Z809 Family history of malignant neoplasm, unspecified: Secondary | ICD-10-CM

## 2015-11-04 DIAGNOSIS — N189 Chronic kidney disease, unspecified: Secondary | ICD-10-CM | POA: Diagnosis present

## 2015-11-04 HISTORY — DX: Personal history of other diseases of the digestive system: Z87.19

## 2015-11-04 HISTORY — DX: Chronic kidney disease, unspecified: N18.9

## 2015-11-04 HISTORY — DX: Paroxysmal atrial fibrillation: I48.0

## 2015-11-04 LAB — CBC
HCT: 33.5 % — ABNORMAL LOW (ref 39.0–52.0)
Hemoglobin: 10.8 g/dL — ABNORMAL LOW (ref 13.0–17.0)
MCH: 31.3 pg (ref 26.0–34.0)
MCHC: 32.2 g/dL (ref 30.0–36.0)
MCV: 97.1 fL (ref 78.0–100.0)
PLATELETS: 183 10*3/uL (ref 150–400)
RBC: 3.45 MIL/uL — AB (ref 4.22–5.81)
RDW: 15.9 % — ABNORMAL HIGH (ref 11.5–15.5)
WBC: 6.7 10*3/uL (ref 4.0–10.5)

## 2015-11-04 LAB — DIFFERENTIAL
BASOS ABS: 0 10*3/uL (ref 0.0–0.1)
Basophils Relative: 0 %
EOS ABS: 0.3 10*3/uL (ref 0.0–0.7)
EOS PCT: 5 %
LYMPHS PCT: 12 %
Lymphs Abs: 0.8 10*3/uL (ref 0.7–4.0)
Monocytes Absolute: 1 10*3/uL (ref 0.1–1.0)
Monocytes Relative: 15 %
NEUTROS PCT: 68 %
Neutro Abs: 4.6 10*3/uL (ref 1.7–7.7)

## 2015-11-04 LAB — I-STAT CHEM 8, ED
BUN: 24 mg/dL — ABNORMAL HIGH (ref 6–20)
CREATININE: 1.5 mg/dL — AB (ref 0.61–1.24)
Calcium, Ion: 1.13 mmol/L (ref 1.13–1.30)
Chloride: 104 mmol/L (ref 101–111)
GLUCOSE: 92 mg/dL (ref 65–99)
HCT: 37 % — ABNORMAL LOW (ref 39.0–52.0)
HEMOGLOBIN: 12.6 g/dL — AB (ref 13.0–17.0)
POTASSIUM: 5 mmol/L (ref 3.5–5.1)
Sodium: 139 mmol/L (ref 135–145)
TCO2: 25 mmol/L (ref 0–100)

## 2015-11-04 LAB — COMPREHENSIVE METABOLIC PANEL
ALBUMIN: 3.5 g/dL (ref 3.5–5.0)
ALK PHOS: 122 U/L (ref 38–126)
ALT: 18 U/L (ref 17–63)
ANION GAP: 10 (ref 5–15)
AST: 26 U/L (ref 15–41)
BUN: 19 mg/dL (ref 6–20)
CO2: 23 mmol/L (ref 22–32)
Calcium: 8.9 mg/dL (ref 8.9–10.3)
Chloride: 105 mmol/L (ref 101–111)
Creatinine, Ser: 1.55 mg/dL — ABNORMAL HIGH (ref 0.61–1.24)
GFR calc Af Amer: 48 mL/min — ABNORMAL LOW (ref >60)
GFR calc non Af Amer: 42 mL/min — ABNORMAL LOW (ref >60)
GLUCOSE: 95 mg/dL (ref 65–99)
POTASSIUM: 5 mmol/L (ref 3.5–5.1)
SODIUM: 138 mmol/L (ref 135–145)
Total Bilirubin: 0.8 mg/dL (ref 0.3–1.2)
Total Protein: 6.4 g/dL — ABNORMAL LOW (ref 6.5–8.1)

## 2015-11-04 LAB — PROTIME-INR
INR: 2.34 — AB (ref 0.00–1.49)
PROTHROMBIN TIME: 25.4 s — AB (ref 11.6–15.2)

## 2015-11-04 LAB — LACTATE DEHYDROGENASE: LDH: 266 U/L — ABNORMAL HIGH (ref 98–192)

## 2015-11-04 LAB — I-STAT TROPONIN, ED: TROPONIN I, POC: 0.04 ng/mL (ref 0.00–0.08)

## 2015-11-04 LAB — CBG MONITORING, ED: Glucose-Capillary: 100 mg/dL — ABNORMAL HIGH (ref 65–99)

## 2015-11-04 LAB — MRSA PCR SCREENING: MRSA BY PCR: NEGATIVE

## 2015-11-04 LAB — APTT: APTT: 44 s — AB (ref 24–37)

## 2015-11-04 MED ORDER — ZINC GLUCONATE 50 MG PO TABS: 100.0000 mg | ORAL_TABLET | Freq: Every day | ORAL | Status: DC

## 2015-11-04 MED ORDER — PNEUMOCOCCAL 13-VAL CONJ VACC IM SUSP
0.5000 mL | INTRAMUSCULAR | Status: DC
  Filled 2015-11-04: qty 0.5

## 2015-11-04 MED ORDER — WARFARIN - PHARMACIST DOSING INPATIENT
Freq: Every day | Status: DC
  Administered 2015-11-04: 18:00:00

## 2015-11-04 MED ORDER — ATORVASTATIN CALCIUM 80 MG PO TABS
80.0000 mg | ORAL_TABLET | Freq: Every day | ORAL | Status: DC
  Administered 2015-11-05 – 2015-11-07 (×3): 80 mg via ORAL
  Filled 2015-11-04 (×3): qty 1

## 2015-11-04 MED ORDER — FERROUS GLUCONATE 324 (38 FE) MG PO TABS
324.0000 mg | ORAL_TABLET | Freq: Two times a day (BID) | ORAL | Status: DC
  Administered 2015-11-04 – 2015-11-07 (×6): 324 mg via ORAL
  Filled 2015-11-04 (×8): qty 1

## 2015-11-04 MED ORDER — ADULT MULTIVITAMIN W/MINERALS CH
1.0000 | ORAL_TABLET | Freq: Every day | ORAL | Status: DC
  Administered 2015-11-05 – 2015-11-06 (×2): 1 via ORAL
  Filled 2015-11-04 (×2): qty 1

## 2015-11-04 MED ORDER — AMLODIPINE BESYLATE 5 MG PO TABS
5.0000 mg | ORAL_TABLET | Freq: Every day | ORAL | Status: DC
  Administered 2015-11-05 – 2015-11-06 (×2): 5 mg via ORAL
  Filled 2015-11-04 (×2): qty 1

## 2015-11-04 MED ORDER — RANOLAZINE ER 500 MG PO TB12
500.0000 mg | ORAL_TABLET | Freq: Two times a day (BID) | ORAL | Status: DC
  Administered 2015-11-04 – 2015-11-07 (×6): 500 mg via ORAL
  Filled 2015-11-04 (×6): qty 1

## 2015-11-04 MED ORDER — WARFARIN SODIUM 2 MG PO TABS
4.0000 mg | ORAL_TABLET | Freq: Once | ORAL | Status: AC
  Administered 2015-11-04: 4 mg via ORAL
  Filled 2015-11-04: qty 2

## 2015-11-04 MED ORDER — DOCUSATE SODIUM 100 MG PO CAPS
200.0000 mg | ORAL_CAPSULE | Freq: Every day | ORAL | Status: DC
  Administered 2015-11-05 – 2015-11-07 (×3): 200 mg via ORAL
  Filled 2015-11-04 (×3): qty 2

## 2015-11-04 MED ORDER — LEVOTHYROXINE SODIUM 100 MCG PO TABS
200.0000 ug | ORAL_TABLET | Freq: Every day | ORAL | Status: DC
  Administered 2015-11-05 – 2015-11-07 (×3): 200 ug via ORAL
  Filled 2015-11-04 (×4): qty 2

## 2015-11-04 MED ORDER — LEVOTHYROXINE SODIUM 100 MCG PO TABS
100.0000 ug | ORAL_TABLET | ORAL | Status: DC
Start: 2015-11-07 — End: 2015-11-07
  Administered 2015-11-07: 100 ug via ORAL
  Filled 2015-11-04: qty 1

## 2015-11-04 MED ORDER — PANTOPRAZOLE SODIUM 40 MG PO TBEC
40.0000 mg | DELAYED_RELEASE_TABLET | Freq: Two times a day (BID) | ORAL | Status: DC
  Administered 2015-11-04 – 2015-11-07 (×6): 40 mg via ORAL
  Filled 2015-11-04 (×6): qty 1

## 2015-11-04 NOTE — Telephone Encounter (Signed)
[name] called to report [name] is "not feeling good" and reports left arm weakness.  Pt awake, alert, MOE x 4, denies facial droop, headache, or change in vision. [name] has pt in car and is "on their way here". Dr. Aundra Dubin updated, instructed her to bring pt to ED here, but if any symptoms worsen, she should stop at local hospital for immediate evaluation. [name] verbalized understanding of same.  ED charge nurse notified of VAD pt with possible stroke like symptom; ETA  @ 1:00 pm per private vehicle. Asked ED charge to notify Dr. Aundra Dubin and [place] pager.

## 2015-11-04 NOTE — H&P (Signed)
VAD TEAM History & Physical Note   Reason for Admission:    HPI:    [name] is a 74 yo male with h/o VT, PAD and severe systolic HF (EF 0000000) due to mixed ischemic/nonischemic CM he is s/p HM II LVAD implant for destination therapy on October 19, 2013. He presents to [facility] with c/o unilateral weakness to left side.    Pertinent admission labs include K 5.0, Cr 1.5, Troponin 0.4, Hgb 12.6, INR 2.34. LDH pending. Head CT with no acute abnormality. Does show mild chronic ischemic white matter disease. Moderate diffuse cortical atrophy.   Noted ~1130 while eating lunch. He said his arm just felt "dead" Denies headache, visual changes, or facial droop. Does have paraesthesias/numbness in left arm, left side of face, and leg.  States INR yesterday 2.3, but of note has had two sub therapeutic INRs in past month (1.6).He has been evaluated by Dr Nicole Kindred of Neurology and NIH stroke score 2 and symptomology at this time does not warrant IR intervention. They think TIA vs small CVA. We will admit for observation over the weekend and further evaluation.  Neurology to follow closely.   LVAD INTERROGATION:  HeartMate II LVAD:  Speed: 9000 Flow: 3.7 PI: 8.2 Power: 4.5w; Rare PI events.   Review of Systems: [y] = yes, [ ]  = no [?] = Unclear, further investigation pending.  General: Weight gain [ ] ; Weight loss [ ] ; Anorexia [ ] ; Fatigue [Y ]; Fever [ ] ; Chills [ ] ; Weakness [Y]  Cardiac: Chest pain/pressure [ ] ; Resting SOB [ ] ; Exertional SOB [ ] ; Orthopnea [ ] ; Pedal Edema [ ] ; Palpitations [ ] ; Syncope [ ] ; Presyncope [ ] ; Paroxysmal nocturnal dyspnea[ ]   Pulmonary: Cough [ ] ; Wheezing[ ] ; Hemoptysis[ ] ; Sputum [ ] ; Snoring [ ]   GI: Vomiting[ ] ; Dysphagia[ ] ; Melena[ ] ; Hematochezia [ ] ; Heartburn[ ] ; Abdominal pain [ ] ; Constipation [ ] ; Diarrhea [ ] ; BRBPR [ ]   GU: Hematuria[ ] ; Dysuria [ ] ; Nocturia[ ]   Vascular: Pain in legs with walking [ ] ; Pain in feet with lying flat [ ] ;  Non-healing sores [ ] ; Stroke [?]; TIA [?]; Slurred speech [ ] ;  Neuro: Headaches[ ] ; Vertigo[ ] ; Seizures[ ] ; Paresthesias[y];Blurred vision [ ] ; Diplopia [ ] ; Vision changes [ ]   Ortho/Skin: Arthritis [y]; Joint pain [y]; Muscle pain [ ] ; Joint swelling [ ] ; Back Pain [ ] ; Rash [ ]   Psych: Depression[ ] ; Anxiety[ ]   Heme: Bleeding problems [ ] ; Clotting disorders [ ] ; Anemia [ ]   Endocrine: Diabetes [ ] ; Thyroid dysfunction[ ]    Home Medications Prior to Admission medications   Medication Sig Start Date End Date Taking? Authorizing Provider  acetaminophen (TYLENOL) 325 MG tablet Take 650 mg by mouth every 6 (six) hours as needed (pain).    Historical Provider, MD  amLODipine (NORVASC) 10 MG tablet Take 5 mg by mouth daily.     Historical Provider, MD  atorvastatin (LIPITOR) 80 MG tablet Take 80 mg by mouth daily.     Historical Provider, MD  docusate sodium (COLACE) 100 MG capsule Take 200 mg by mouth daily.     Historical Provider, MD  ferrous gluconate (FERGON) 324 MG tablet Take 1 tablet (324 mg total) by mouth 2 (two) times daily with a meal. 06/23/15   Amy D Clegg, NP  furosemide (LASIX) 20 MG tablet Take 1 tablet (20 mg total) by mouth daily. Patient taking differently: Take 20  mg by mouth daily as needed for fluid.  09/21/15   Julianne Rice, MD  levothyroxine (SYNTHROID, LEVOTHROID) 200 MCG tablet Take one 200 mcg daily before breakfast every day with 1 1/2 (300 mcg) every Monday. 10/26/15   Jolaine Artist, MD  Multiple Vitamin (MULTIVITAMIN WITH MINERALS) TABS tablet Take 1 tablet by mouth at bedtime.    Historical Provider, MD  pantoprazole (PROTONIX) 40 MG tablet Take 1 tablet (40 mg total) by mouth 2 (two) times daily. 02/24/15   Amy D Clegg, NP  RANEXA 500 MG 12 hr tablet TAKE 1 TABLET (500 MG TOTAL) BY MOUTH 2 (TWO) TIMES DAILY. 10/17/15   Larey Dresser, MD  warfarin (COUMADIN) 2 MG tablet Take 2-4 mg by mouth daily. 4mg  on Mondays and Thursdays, 2mg  all other days     Historical Provider, MD  zinc gluconate 50 MG tablet Take 100 mg by mouth at bedtime.     Historical Provider, MD    Past Medical History: Past Medical History  Diagnosis Date  . Bradycardia   . Ventricular tachycardia ([place])     s/p AICD;   h/o ICD shock;  Amiodarone Rx. S/P ablation in 2012  . PVD (peripheral vascular disease) ([place])     h/o claudication;  s/p R fem to pop BPG 3/11;  s/p L CFA BPG 7/03;  s/p repair of inf AAA 6/03;  s/p aorta to bilat renal BPG 6/03  . HTN (hypertension)   . HLD (hyperlipidemia)   . Cytopenia   . Hypothyroidism   . Renal insufficiency   . Claudication ([place])   . Chronic systolic heart failure ([place])   . Ischemic cardiomyopathy     echo 7/12:  EF 25-30%, mild AI, mod MR, mod LAE  . TIA (transient ischemic attack) 2001  . CVD (cerebrovascular disease)     left CEA 2008  . Stroke ([place])   . Anemia   . Inappropriate therapy from implantable cardioverter-defibrillator 04/02/2013    ATP for sinus tach  SVT wavelet identified SVT but was no  passive   . Myocardial infarction ([place]) 1992  . Anginal pain ([place])   . Automatic implantable cardioverter-defibrillator in situ     Medtronic Evera device serial number [id number]    . Arthritis     "a little in my knees" (05/25/2013)  . 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 Dysrhythmia     HX A FIB  . History of nonmelanoma skin cancer     L EAR  . Kidney stone   . History of transfusion   . Sleep apnea     denies wearing mask (05/25/2013)  . CAD (coronary artery disease)     s/p MI 1982;  s/p DES to CFX 2011;  s/p NSTEMI 4/12 in setting of VTach;  cath 7/12:  LM ok, LAD mid 30-40%, Dx's with 40%, mCFX stent patent, prox to mid RCA occluded with R to R and L to R collats.  Medical Tx was continued  . CHF (congestive heart failure) ([place])     HAS VENTRICULAR PUMP    Past Surgical History: Past Surgical History  Procedure Laterality Date  .  Femoral-popliteal bypass graft Right 2011  . Cardiac defibrillator placement  2003; 2005; 05/25/2013    2014: Medtronic Evera device serial number [id number]  . Revascularization / in-situ graft leg    . Renal artery bypass Bilateral 2003  .  Back surgery    . Lumbar disc surgery  1974; 2000's  . Knee arthroscopy Bilateral 1990's  . Elbow arthroscopy Right 1990's  . Cholecystectomy  12/15/?2010  . Lipoma excision Left 2013    "near ear" (05/25/2013)  . Heel spur surgery Right 1990's  . Cataract extraction w/ intraocular lens  implant, bilateral Bilateral 2000  . Carotid endarterectomy Left ~ 2007  . Doppler echocardiography  2011  . Tonsillectomy  1946  . Femoral-popliteal bypass graft Left 05/2002    Archie Endo 05/06/2002 (05/25/2013)  . Tumor excision Left 1960's    "fatty tumor" (05/25/2013)  . Cardiac electrophysiology study and ablation  2012  . Coronary angioplasty  1992  . Cardiac catheterization      "several" (05/25/2013)  . Coronary angioplasty with stent placement      "last one was 11/2012  (05/25/2013)  . Cardioversion N/A 10/15/2013    Procedure: CARDIOVERSION;  Surgeon: Jolaine Artist, MD;  Location: [place];  Service: Cardiovascular;  Laterality: N/A;  . Insertion of implantable left ventricular assist device N/A 10/19/2013    Procedure: INSERTION OF IMPLANTABLE LEFT VENTRICULAR ASSIST DEVICE;  Surgeon: Gaye Pollack, MD;  Location: [place];  Service: Open Heart Surgery;  Laterality: N/A;  CIRC ARREST  NITRIC OXIDE  MEDTRONIC ICD  . Intraoperative transesophageal echocardiogram N/A 10/19/2013    Procedure: INTRAOPERATIVE TRANSESOPHAGEAL ECHOCARDIOGRAM;  Surgeon: Gaye Pollack, MD;  Location[facility] OR;  Service: Open Heart Surgery;  Laterality: N/A;  . Tee without cardioversion N/A 11/04/2013    Procedure: TRANSESOPHAGEAL ECHOCARDIOGRAM (TEE);  Surgeon: Larey Dresser, MD;  Location: [place];  Service: Cardiovascular;  Laterality: N/A;  . Cardioversion N/A 11/04/2013     Procedure: CARDIOVERSION;  Surgeon: Larey Dresser, MD;  Location: [place];  Service: Cardiovascular;  Laterality: N/A;  . Cardioversion N/A 06/04/2014    Procedure: CARDIOVERSION;  Surgeon: Jolaine Artist, MD;  Location: [facility] ENDOSCOPY;  Service: Cardiovascular;  Laterality: N/A;  . Cystoscopy with retrograde pyelogram, ureteroscopy and stent placement Left 08/09/2014    Procedure: [place], URETEROSCOPY AND STENT PLACEMENT;  Surgeon: Bernestine Amass, MD;  Location: [facility] ORS;  Service: Urology;  Laterality: Left;  . Cystoscopy with retrograde pyelogram, ureteroscopy and stent placement Left 09/01/2014    Procedure: CYSTOSCOPY WITH URETEROSCOPY AND STENT REMOVAL;  Surgeon: Bernestine Amass, MD;  Location: [facility] ORS;  Service: Urology;  Laterality: Left;  . Holmium laser application Left [id number]    Procedure: HOLMIUM LASER APPLICATION;  Surgeon: Bernestine Amass, MD;  Location: [facility] ORS;  Service: Urology;  Laterality: Left;  . Cystoscopy with retrograde pyelogram, ureteroscopy and stent placement Left 09/20/2014    Procedure: CYSTOSCOPY WITH RETROGRADE PYELOGRAM, URETEROSCOPY AND STENT PLACEMENT, stone removal;  Surgeon: Bernestine Amass, MD;  Location: [facility] ORS;  Service: Urology;  Laterality: Left;  Stephanie Coup ablation N/A 09/12/2011    Procedure: V-TACH ABLATION;  Surgeon: Evans Lance, MD;  Location: [facility] CATH LAB;  Service: Cardiovascular;  Laterality: N/A;  . Left heart catheterization with coronary angiogram N/A 11/24/2012    Procedure: LEFT HEART CATHETERIZATION WITH CORONARY ANGIOGRAM;  Surgeon: Peter M Martinique, MD;  Location: [facility] CATH LAB;  Service: Cardiovascular;  Laterality: N/A;  . Implantable cardioverter defibrillator generator change N/A 05/25/2013    Procedure: IMPLANTABLE CARDIOVERTER DEFIBRILLATOR GENERATOR CHANGE;  Surgeon: Deboraha Sprang, MD;  Location: [facility] CATH LAB;  Service: Cardiovascular;  Laterality: N/A;  . Lead revision N/A 06/05/2013    Procedure: LEAD  REVISION;  Surgeon: Thompson Grayer, MD;  Location: [facility] CATH LAB;  Service: Cardiovascular;  Laterality: N/A;  . V-tach ablation N/A 06/08/2013    Procedure: V-TACH ABLATION;  Surgeon: Evans Lance, MD;  Location: [facility] CATH LAB;  Service: Cardiovascular;  Laterality: N/A;  . Right heart catheterization N/A 09/17/2013    Procedure: RIGHT HEART CATH;  Surgeon: Jolaine Artist, MD;  Location: [facility] CATH LAB;  Service: Cardiovascular;  Laterality: N/A;  . Right heart catheterization N/A 10/14/2013    Procedure: RIGHT HEART CATH;  Surgeon: Jolaine Artist, MD;  Location: [facility] CATH LAB;  Service: Cardiovascular;  Laterality: N/A;  . Right heart catheterization N/A 11/16/2013    Procedure: RIGHT HEART CATH;  Surgeon: Jolaine Artist, MD;  Location: [facility] CATH LAB;  Service: Cardiovascular;  Laterality: N/A;  . Right heart catheterization N/A 07/13/2014    Procedure: RIGHT HEART CATH;  Surgeon: Jolaine Artist, MD;  Location: [facility] CATH LAB;  Service: Cardiovascular;  Laterality: N/A;  . Esophagogastroduodenoscopy N/A 02/15/2015    Procedure: ESOPHAGOGASTRODUODENOSCOPY (EGD);  Surgeon: Carol Ada, MD;  Location: [facility] ENDOSCOPY;  Service: Endoscopy;  Laterality: N/A;  LVAD  . Tee without cardioversion N/A 03/02/2015    Procedure: TRANSESOPHAGEAL ECHOCARDIOGRAM (TEE);  Surgeon: Jolaine Artist, MD;  Location: [facility] ENDOSCOPY;  Service: Cardiovascular;  Laterality: N/A;  . Cardioversion N/A 03/02/2015    Procedure: CARDIOVERSION;  Surgeon: Jolaine Artist, MD;  Location: [facility] ENDOSCOPY;  Service: Cardiovascular;  Laterality: N/A;    Family History: Family History  Problem Relation Age of Onset  . Coronary artery disease    . Heart attack Mother   . Coronary artery disease Mother   . Cancer Mother   . Heart disease Mother   . Hyperlipidemia Mother   . Hypertension Mother   . Coronary artery disease Father   . Stroke Father   . Cancer Father   . Heart disease Father     before age 73  . Hyperlipidemia  Father   . Hypertension Father   . Heart attack Father     Social History: Social History   Social History  . Marital Status: Divorced    Spouse Name: N/A  . Number of Children: 2  . Years of Education: N/A   Occupational History  . retired     [employer]  . Retired     Security [employer]  . 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 Smoking status: Former Smoker -- 0.50 packs/day for 37 years    Types: Cigarettes    Quit date: 11/05/2001  . Smokeless tobacco: Never Used  . Alcohol Use: 0.0 oz/week     Comment: 05/25/2013 "mixed drink couple times/month"  . Drug Use: No  . Sexual Activity: No   Other Topics Concern  . None   Social History Narrative   Lives with sig other.    Allergies:  Allergies  Allergen Reactions  . Demerol [Meperidine] Other (See Comments)    Paralysis. Could only move eyes.    Objective:    Vital Signs:   Temp:  [98 F (36.7 C)] 98 F (36.7 C) (12/30 1302) Pulse Rate:  [69-70] 70 (12/30 1345) Resp:  [15-22] 22 (12/30 1345) BP: (115-116)/(96-97) 116/97 mmHg (12/30 1345) SpO2:  [97 %-99 %] 97 % (12/30 1345) Weight:  [186 lb (84.369 kg)] 186 lb (84.369 kg) (12/30 1302)   Filed Weights   11/04/15 1302  Weight: 186  lb (84.369 kg)    Mean arterial Pressure 100s currently, will watch closely.  Physical Exam: General:  Well appearing. No resp difficulty HEENT: normal Neck: supple. JVP not elevated. Carotids 2+ bilat; no bruits. No lymphadenopathy or thyromegaly noted. Cor: Mechanical heart sounds with LVAD hum present. Lungs: CTAB Abdomen: soft, NT, ND, no HSM. No bruits or masses. +BS Driveline: C/D/I; securement device intact and driveline incorporated Extremities: no cyanosis, clubbing, rash, edema Neuro: alert & orientedx3, cranial nerves grossly intact. Affect pleasant. No facial droop or slurred speech.  Left arm and leg weakness.   Telemetry:  A Paced 70s  Labs: Basic Metabolic  Panel:  Recent Labs Lab 11/04/15 1310 11/04/15 1319  NA 138 139  K 5.0 5.0  CL 105 104  CO2 23  --   GLUCOSE 95 92  BUN 19 24*  CREATININE 1.55* 1.50*  CALCIUM 8.9  --     Liver Function Tests:  Recent Labs Lab 11/04/15 1310  AST 26  ALT 18  ALKPHOS 122  BILITOT 0.8  PROT 6.4*  ALBUMIN 3.5   No results for input(s): LIPASE, AMYLASE in the last 168 hours. No results for input(s): AMMONIA in the last 168 hours.  CBC:  Recent Labs Lab 11/04/15 1310 11/04/15 1319  WBC 6.7  --   NEUTROABS 4.6  --   HGB 10.8* 12.6*  HCT 33.5* 37.0*  MCV 97.1  --   PLT 183  --     Cardiac Enzymes: No results for input(s): CKTOTAL, CKMB, CKMBINDEX, TROPONINI in the last 168 hours.  BNP: BNP (last 3 results)  Recent Labs  09/21/15 0021 09/22/15 1330 10/07/15 1125  BNP 638.3* 707.6* 491.6*    ProBNP (last 3 results) No results for input(s): PROBNP in the last 8760 hours.   CBG:  Recent Labs Lab 11/04/15 1339  GLUCAP 100*    Coagulation Studies:  Recent Labs  11/03/15 11/04/15 1310  LABPROT  --  25.4*  INR 2.3 2.34*    Other results: EKG: 11/04/15 A paced 70s  Imaging: Ct Head Wo Contrast  11/04/2015  CLINICAL DATA:  Left arm and left leg weakness. EXAM: CT HEAD WITHOUT CONTRAST TECHNIQUE: Contiguous axial images were obtained from the base of the skull through the vertex without intravenous contrast. COMPARISON:  CT scan of October 15, 2013. FINDINGS: Bony calvarium appears intact. Moderate diffuse cortical atrophy is noted. Mild chronic ischemic white matter disease is noted. No mass effect or midline shift is noted. Ventricular size is within normal limits. There is no evidence of mass lesion, hemorrhage or acute infarction. IMPRESSION: Mild chronic ischemic white matter disease. Moderate diffuse cortical atrophy. No acute intracranial abnormality seen. These results were called by telephone at the time of interpretation on 11/04/2015 at 1:28 pm to Dr.  Nicole Kindred, who verbally acknowledged these results. Electronically Signed   By: Marijo Conception, M.D.   On: 11/04/2015 13:30         Assessment/Plan   [name] is a 74 yo male with h/o VT, PAD and severe systolic HF (EF 0000000) due to mixed ischemic/nonischemic CM he is s/p HM II LVAD implant for destination therapy on October 19, 2013. Presented to [facility] with left sided weakness. Has had 2x sub therapeutic INRs x 1 month.   1. Left sided weakness - Initial CT negative for acute abnormality.  Neuro to observe currently and not pursue aggressive rx at this time.  - We will admit to 2H for observation and further evaluation.  They believe TIA vs small CVA. - PT/OT/ST to see.  - Will get Echo and Carotids US. Will leave any repeat CT timing to neuro.  - NPO until swallow study complete. 2. Chronic systolic HF: s/p LVAD [id number].  - Volume status stable. Can get po lasix as needed. 3. Atrial arrhythmias: Atrial fibrillation, atrial tachycardia. S/p DC-CV 06/04/14 and repeat DC-CV 03/02/15. - Will monitor on tele and have device interrogated. - INR 2.34 today. 4. HTN: MAP ok on amlodipine. 5. LVAD: LVAD parameters reviewed personally, stable. 6.CKD, stage III: Follow with BMETs in hospital. Stable today, though up very slightly from recent baselines.  7. Anticoagulation management: Goal INR 2-2.5. He is off ASA with GI bleeding.  8. 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 Will have to watch closely if need plavix added. 9. Lumbar compression fracture: Had surgery scheduled for 11/29/15. This will likely be further delayed.   I reviewed the LVAD parameters from today, and compared the results to the patient's prior recorded data.  No programming changes were made.  The LVAD is functioning within specified parameters.  The patient performs LVAD self-test daily.  LVAD interrogation was negative for any significant power changes, alarms or PI events/speed drops.  LVAD equipment  check completed and is in good working order.  Back-up equipment present.   LVAD education done on emergency procedures and precautions and reviewed exit site care.  Length of Stay:   Shirley Friar PA-C 11/04/2015, 2:13 PM  VAD Team Pager [phone] (7am - 7am) +++VAD ISSUES ONLY+++   Advanced Heart Failure Team Pager [phone] (M-F; [place])  Please contact [facility] for night-coverage after hours (4p -7a ) and weekends on [email or web address] for all non- LVAD Issues  Patient seen with PA, agree with the above note.  He developed left arm and leg weakness today and was brought to hospital.  Suspect CVA.  INR was therapeutic today but he has had some subtherapeutic readings in the last month.  LVAD parameters normal.  LDH is stable.  No evidence for large pump thrombus.  ECG is a-paced.  Head CT showed nothing acute.  He has been seen by neurology and I have discussed with the neurologist.  Symptoms not severe enough for IR intervention.  - Will admit for observation.  - Continue coumadin. - Has been off ASA with GI bleed in 4/16.  Will likely start Plavix in addition to coumadin, but await recommendation of stroke team on rounds. - MAP elevated in setting of CVA but will follow for now, suspect will come down.  - Echo and carotid dopplers.  Repeat CT per neurology.   Loralie Champagne 11/04/2015 3:39 PM

## 2015-11-04 NOTE — Consult Note (Signed)
Admission H&P    Chief Complaint: Acute onset of left extremity weakness and numbness.  HPI: [name] is an 74 y.o. male with a history of CHF with LVAD, coronary artery disease, hypertension, left CEA in TIA, presenting with acute onset of weakness and numbness involving left arm and left leg at 11:30 AM today. Patient is on anticoagulation Coumadin, and INR today was 2.34. CT scan of his head showed no acute intracranial abnormality. He had persistent numbness and mild weakness involving his left arm as well as numbness of the left side of his face and left leg. There were no changes in speech. No facial droop was noted. NIH stroke score was 2.  LSN: 11:30 AM on 11/04/2015 tPA Given: No: INR therapeutic on Coumadin; mild deficits only mRankin:  Past Medical History  Diagnosis Date  . Bradycardia   . Ventricular tachycardia ([place])     s/p AICD;   h/o ICD shock;  Amiodarone Rx. S/P ablation in 2012  . PVD (peripheral vascular disease) ([place])     h/o claudication;  s/p R fem to pop BPG 3/11;  s/p L CFA BPG 7/03;  s/p repair of inf AAA 6/03;  s/p aorta to bilat renal BPG 6/03  . HTN (hypertension)   . HLD (hyperlipidemia)   . Cytopenia   . Hypothyroidism   . Renal insufficiency   . Claudication ([place])   . Chronic systolic heart failure ([place])   . Ischemic cardiomyopathy     echo 7/12:  EF 25-30%, mild AI, mod MR, mod LAE  . TIA (transient ischemic attack) 2001  . CVD (cerebrovascular disease)     left CEA 2008  . Stroke ([place])   . Anemia   . Inappropriate therapy from implantable cardioverter-defibrillator 04/02/2013    ATP for sinus tach  SVT wavelet identified SVT but was no  passive   . Myocardial infarction ([place]) 1992  . Anginal pain ([place])   . Automatic implantable cardioverter-defibrillator in situ     Medtronic Evera device serial number [id number]    . Arthritis     "a little in my knees" (05/25/2013)  . 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 Loss  of R waves on his ICD 03/30/2014  . Dysrhythmia     HX A FIB  . History of nonmelanoma skin cancer     L EAR  . Kidney stone   . History of transfusion   . Sleep apnea     denies wearing mask (05/25/2013)  . CAD (coronary artery disease)     s/p MI 1982;  s/p DES to CFX 2011;  s/p NSTEMI 4/12 in setting of VTach;  cath 7/12:  LM ok, LAD mid 30-40%, Dx's with 40%, mCFX stent patent, prox to mid RCA occluded with R to R and L to R collats.  Medical Tx was continued  . CHF (congestive heart failure) ([place])     HAS VENTRICULAR PUMP    Past Surgical History  Procedure Laterality Date  . Femoral-popliteal bypass graft Right 2011  . Cardiac defibrillator placement  2003; 2005; 05/25/2013    2014: Medtronic Evera device serial number [id number]  . Revascularization / in-situ graft leg    . Renal artery bypass Bilateral 2003  . Back surgery    . Lumbar disc surgery  1974; 2000's  . Knee arthroscopy Bilateral 1990's  . Elbow arthroscopy Right 1990's  . Cholecystectomy  12/15/?2010  . Lipoma excision Left 2013    "  near ear" (05/25/2013)  . Heel spur surgery Right 1990's  . Cataract extraction w/ intraocular lens  implant, bilateral Bilateral 2000  . Carotid endarterectomy Left ~ 2007  . Doppler echocardiography  2011  . Tonsillectomy  1946  . Femoral-popliteal bypass graft Left 05/2002    Archie Endo 05/06/2002 (05/25/2013)  . Tumor excision Left 1960's    "fatty tumor" (05/25/2013)  . Cardiac electrophysiology study and ablation  2012  . Coronary angioplasty  1992  . Cardiac catheterization      "several" (05/25/2013)  . Coronary angioplasty with stent placement      "last one was 11/2012  (05/25/2013)  . Cardioversion N/A 10/15/2013    Procedure: CARDIOVERSION;  Surgeon: Jolaine Artist, MD;  Location: [place];  Service: Cardiovascular;  Laterality: N/A;  . Insertion of implantable left ventricular assist device N/A 10/19/2013    Procedure: INSERTION OF IMPLANTABLE LEFT VENTRICULAR ASSIST  DEVICE;  Surgeon: Gaye Pollack, MD;  Location: [place];  Service: Open Heart Surgery;  Laterality: N/A;  CIRC ARREST  NITRIC OXIDE  MEDTRONIC ICD  . Intraoperative transesophageal echocardiogram N/A 10/19/2013    Procedure: INTRAOPERATIVE TRANSESOPHAGEAL ECHOCARDIOGRAM;  Surgeon: Gaye Pollack, MD;  Location: [facility] OR;  Service: Open Heart Surgery;  Laterality: N/A;  . Tee without cardioversion N/A 11/04/2013    Procedure: TRANSESOPHAGEAL ECHOCARDIOGRAM (TEE);  Surgeon: Larey Dresser, MD;  Location: [place];  Service: Cardiovascular;  Laterality: N/A;  . Cardioversion N/A 11/04/2013    Procedure: CARDIOVERSION;  Surgeon: Larey Dresser, MD;  Location: [place];  Service: Cardiovascular;  Laterality: N/A;  . Cardioversion N/A 06/04/2014    Procedure: CARDIOVERSION;  Surgeon: Jolaine Artist, MD;  Location: [facility] ENDOSCOPY;  Service: Cardiovascular;  Laterality: N/A;  . Cystoscopy with retrograde pyelogram, ureteroscopy and stent placement Left 08/09/2014    Procedure: [place], URETEROSCOPY AND STENT PLACEMENT;  Surgeon: Bernestine Amass, MD;  Location: [facility] ORS;  Service: Urology;  Laterality: Left;  . Cystoscopy with retrograde pyelogram, ureteroscopy and stent placement Left 09/01/2014    Procedure: CYSTOSCOPY WITH URETEROSCOPY AND STENT REMOVAL;  Surgeon: Bernestine Amass, MD;  Location: [facility] ORS;  Service: Urology;  Laterality: Left;  . Holmium laser application Left [id number]    Procedure: HOLMIUM LASER APPLICATION;  Surgeon: Bernestine Amass, MD;  Location: [facility];  Service: Urology;  Laterality: Left;  . Cystoscopy with retrograde pyelogram, ureteroscopy and stent placement Left 09/20/2014    Procedure: CYSTOSCOPY WITH RETROGRADE PYELOGRAM, URETEROSCOPY AND STENT PLACEMENT, stone removal;  Surgeon: Bernestine Amass, MD;  Location: [facility];  Service: Urology;  Laterality: Left;  Stephanie Coup ablation N/A 09/12/2011    Procedure: V-TACH ABLATION;  Surgeon: Evans Lance, MD;  Location: [facility] CATH LAB;  Service: Cardiovascular;  Laterality: N/A;  . Left heart catheterization with coronary angiogram N/A 11/24/2012    Procedure: LEFT HEART CATHETERIZATION WITH CORONARY ANGIOGRAM;  Surgeon: Peter M Martinique, MD;  Location: [facility] CATH LAB;  Service: Cardiovascular;  Laterality: N/A;  . Implantable cardioverter defibrillator generator change N/A 05/25/2013    Procedure: IMPLANTABLE CARDIOVERTER DEFIBRILLATOR GENERATOR CHANGE;  Surgeon: Deboraha Sprang, MD;  Location: [facility] CATH LAB;  Service: Cardiovascular;  Laterality: N/A;  . Lead revision N/A 06/05/2013    Procedure: LEAD REVISION;  Surgeon: Thompson Grayer, MD;  Location: [facility] CATH LAB;  Service: Cardiovascular;  Laterality: N/A;  . V-tach ablation N/A 06/08/2013    Procedure: V-TACH ABLATION;  Surgeon: Evans Lance, MD;  Location: [facility]  CATH LAB;  Service: Cardiovascular;  Laterality: N/A;  . Right heart catheterization N/A 09/17/2013    Procedure: RIGHT HEART CATH;  Surgeon: Jolaine Artist, MD;  Location: [facility] CATH LAB;  Service: Cardiovascular;  Laterality: N/A;  . Right heart catheterization N/A 10/14/2013    Procedure: RIGHT HEART CATH;  Surgeon: Jolaine Artist, MD;  Location: [facility] CATH LAB;  Service: Cardiovascular;  Laterality: N/A;  . Right heart catheterization N/A 11/16/2013    Procedure: RIGHT HEART CATH;  Surgeon: Jolaine Artist, MD;  Location: [facility] CATH LAB;  Service: Cardiovascular;  Laterality: N/A;  . Right heart catheterization N/A 07/13/2014    Procedure: RIGHT HEART CATH;  Surgeon: Jolaine Artist, MD;  Location: [facility] CATH LAB;  Service: Cardiovascular;  Laterality: N/A;  . Esophagogastroduodenoscopy N/A 02/15/2015    Procedure: ESOPHAGOGASTRODUODENOSCOPY (EGD);  Surgeon: Carol Ada, MD;  Location: [facility] ENDOSCOPY;  Service: Endoscopy;  Laterality: N/A;  LVAD  . Tee without cardioversion N/A 03/02/2015    Procedure: TRANSESOPHAGEAL ECHOCARDIOGRAM (TEE);  Surgeon: Jolaine Artist, MD;  Location: [facility]  ENDOSCOPY;  Service: Cardiovascular;  Laterality: N/A;  . Cardioversion N/A 03/02/2015    Procedure: CARDIOVERSION;  Surgeon: Jolaine Artist, MD;  Location: [facility] ENDOSCOPY;  Service: Cardiovascular;  Laterality: N/A;    Family History  Problem Relation Age of Onset  . Coronary artery disease    . Heart attack Mother   . Coronary artery disease Mother   . Cancer Mother   . Heart disease Mother   . Hyperlipidemia Mother   . Hypertension Mother   . Coronary artery disease Father   . Stroke Father   . Cancer Father   . Heart disease Father     before age 11  . Hyperlipidemia Father   . Hypertension Father   . Heart attack Father    Social History:  reports that he quit smoking about 14 years ago. His smoking use included Cigarettes. He has a 18.5 pack-year smoking history. He has never used smokeless tobacco. He reports that he drinks alcohol. He reports that he does not use illicit drugs.  Allergies:  Allergies  Allergen Reactions  . Demerol [Meperidine] Other (See Comments)    Paralysis. Could only move eyes.    Medications: Patient's preadmission medications were reviewed by me.  ROS: History obtained from spouse and the patient  General ROS: negative for - chills, fatigue, fever, night sweats, weight gain or weight loss Psychological ROS: negative for - behavioral disorder, hallucinations, memory difficulties, mood swings or suicidal ideation Ophthalmic ROS: negative for - blurry vision, double vision, eye pain or loss of vision ENT ROS: negative for - epistaxis, nasal discharge, oral lesions, sore throat, tinnitus or vertigo Allergy and Immunology ROS: negative for - hives or itchy/watery eyes Hematological and Lymphatic ROS: negative for - bleeding problems, bruising or swollen lymph nodes Endocrine ROS: negative for - galactorrhea, hair pattern changes, polydipsia/polyuria or temperature intolerance Respiratory ROS: negative for - cough, hemoptysis, shortness of  breath or wheezing Cardiovascular ROS: negative for - chest pain, dyspnea on exertion, edema or irregular heartbeat Gastrointestinal ROS: negative for - abdominal pain, diarrhea, hematemesis, nausea/vomiting or stool incontinence Genito-Urinary ROS: negative for - dysuria, hematuria, incontinence or urinary frequency/urgency Musculoskeletal ROS: negative for - joint swelling or muscular weakness Neurological ROS: as noted in HPI Dermatological ROS: negative for rash and skin lesion changes  Physical Examination: Pulse 69, temperature 98 F (36.7 C), temperature source Oral, resp. rate 18, height 6' (1.829 m), weight 84.369  kg (186 lb), SpO2 99 %.  HEENT-  Normocephalic, no lesions, without obvious abnormality.  Normal external eye and conjunctiva.  Normal TM's bilaterally.  Normal auditory canals and external ears. Normal external nose, mucus membranes and septum.  Normal pharynx. Neck supple with no masses, nodes, nodules or enlargement. Cardiovascular - regular rate and rhythm and holosystolic mechanical sound predominantly from LVAD. Lungs - chest clear, no wheezing, rales, normal symmetric air entry Abdomen - soft, non-tender; bowel sounds normal; no masses,  no organomegaly Extremities - no joint deformities, effusion, or inflammation and no edema  Neurologic Examination: Mental Status: Alert, oriented, thought content appropriate.  Speech fluent without evidence of aphasia. Able to follow commands without difficulty. Cranial Nerves: II-Visual fields were normal. III/IV/VI-Pupils were equal and reacted normally to light. Extraocular movements were full and conjugate.    V/VII-reduced perception of tactile sensation over left side of the face compared to the right; facial weakness. VIII-normal. X-normal speech and symmetrical palatal movement. XI: trapezius strength/neck flexion strength normal bilaterally XII-midline tongue extension with normal strength. Motor: Mild drift of left  upper extremity; her exam otherwise unremarkable. Sensory: Reduced perception of tactile sensation over left extremities compared to right. Deep Tendon Reflexes: 1+ and symmetric. Plantars: Mute bilaterally Cerebellar: Normal finger-to-nose testing. Carotid auscultation: Normal  Results for orders placed or performed during the hospital encounter of 11/04/15 (from the past 48 hour(s))  Protime-INR     Status: Abnormal   Collection Time: 11/04/15  1:10 PM  Result Value Ref Range   Prothrombin Time 25.4 (H) 11.6 - 15.2 seconds   INR 2.34 (H) 0.00 - 1.49  APTT     Status: Abnormal   Collection Time: 11/04/15  1:10 PM  Result Value Ref Range   aPTT 44 (H) 24 - 37 seconds    Comment:        IF BASELINE aPTT IS ELEVATED, SUGGEST PATIENT RISK ASSESSMENT BE USED TO DETERMINE APPROPRIATE ANTICOAGULANT THERAPY.   CBC     Status: Abnormal   Collection Time: 11/04/15  1:10 PM  Result Value Ref Range   WBC 6.7 4.0 - 10.5 K/uL   RBC 3.45 (L) 4.22 - 5.81 MIL/uL   Hemoglobin 10.8 (L) 13.0 - 17.0 g/dL   HCT 33.5 (L) 39.0 - 52.0 %   MCV 97.1 78.0 - 100.0 fL   MCH 31.3 26.0 - 34.0 pg   MCHC 32.2 30.0 - 36.0 g/dL   RDW 15.9 (H) 11.5 - 15.5 %   Platelets 183 150 - 400 K/uL  Differential     Status: None   Collection Time: 11/04/15  1:10 PM  Result Value Ref Range   Neutrophils Relative % 68 %   Neutro Abs 4.6 1.7 - 7.7 K/uL   Lymphocytes Relative 12 %   Lymphs Abs 0.8 0.7 - 4.0 K/uL   Monocytes Relative 15 %   Monocytes Absolute 1.0 0.1 - 1.0 K/uL   Eosinophils Relative 5 %   Eosinophils Absolute 0.3 0.0 - 0.7 K/uL   Basophils Relative 0 %   Basophils Absolute 0.0 0.0 - 0.1 K/uL  I-stat troponin, ED (not at [facility], [facility])     Status: None   Collection Time: 11/04/15  1:17 PM  Result Value Ref Range   Troponin i, poc 0.04 0.00 - 0.08 ng/mL   Comment 3            Comment: Due to the release kinetics of cTnI, a negative result within the first hours of the onset  of symptoms does not rule  out myocardial infarction with certainty. If myocardial infarction is still suspected, repeat the test at appropriate intervals.   I-Stat Chem 8, ED  (not at [facility], [facility])     Status: Abnormal   Collection Time: 11/04/15  1:19 PM  Result Value Ref Range   Sodium 139 135 - 145 mmol/L   Potassium 5.0 3.5 - 5.1 mmol/L   Chloride 104 101 - 111 mmol/L   BUN 24 (H) 6 - 20 mg/dL   Creatinine, Ser 1.50 (H) 0.61 - 1.24 mg/dL   Glucose, Bld 92 65 - 99 mg/dL   Calcium, Ion 1.13 1.13 - 1.30 mmol/L   TCO2 25 0 - 100 mmol/L   Hemoglobin 12.6 (L) 13.0 - 17.0 g/dL   HCT 37.0 (L) 39.0 - 52.0 %  CBG monitoring, ED     Status: Abnormal   Collection Time: 11/04/15  1:39 PM  Result Value Ref Range   Glucose-Capillary 100 (H) 65 - 99 mg/dL   Ct Head Wo Contrast  11/04/2015  CLINICAL DATA:  Left arm and left leg weakness. EXAM: CT HEAD WITHOUT CONTRAST TECHNIQUE: Contiguous axial images were obtained from the base of the skull through the vertex without intravenous contrast. COMPARISON:  CT scan of October 15, 2013. FINDINGS: Bony calvarium appears intact. Moderate diffuse cortical atrophy is noted. Mild chronic ischemic white matter disease is noted. No mass effect or midline shift is noted. Ventricular size is within normal limits. There is no evidence of mass lesion, hemorrhage or acute infarction. IMPRESSION: Mild chronic ischemic white matter disease. Moderate diffuse cortical atrophy. No acute intracranial abnormality seen. These results were called by telephone at the time of interpretation on 11/04/2015 at 1:28 pm to Dr. Nicole Kindred, who verbally acknowledged these results. Electronically Signed   By: Marijo Conception, M.D.   On: 11/04/2015 13:30    Assessment: 74 y.o. male multiple risk factors for stroke presenting with acute right subcortical TIA or possible acute small vessel stroke.  Stroke Risk Factors - carotid stenosis, hyperlipidemia and hypertension  Plan: 1. HgbA1c, fasting lipid panel 2. PT  consult, OT consult, Speech consult 3. CTA of head and neck with contrast 4. Prophylactic therapy-Anticoagulation: Coumadin 5. Risk factor modification 6. Telemetry monitoring  C.R. Nicole Kindred, MD Neurohospitalist [phone]  11/04/2015, 1:43 PM

## 2015-11-04 NOTE — Progress Notes (Signed)
Utilization Review Completed.[name] T12/30/2016  

## 2015-11-04 NOTE — ED Notes (Signed)
Dr. Aundra Dubin at the bedside.

## 2015-11-04 NOTE — ED Notes (Signed)
Dr. Nicole Kindred notified of pts left leg drift upon two hour neuro assessment. MD instructs no actions to be taken at this time. No other neuro changes since initial assessment.

## 2015-11-04 NOTE — ED Notes (Signed)
Pt reports that he started having left arm numbness and heaviness since 1130 today. Pt also reports left leg numbness which started on the way to the hospital. Pt also is an LVAD pt, LVAD coordinator aware.

## 2015-11-04 NOTE — Code Documentation (Signed)
74yo male arriving to [facility] via private vehicle at 1257.  Patient reports being out at lunch with his girlfriend when he developed sudden onset of left arm weakness and numbness at 1130.  Of note, patient has a LVAD.  Code stroke called on patient arrival to the ED.  Patient to CT.  Stroke team to the bedside.  NIHSS 2, see documentation for details and code stroke times.  Patient with left arm drift and decreased sensation on the left side.  Patient is on Coumadin with INR 2.34 on arrival.  Patient is contraindicated for treatment with tPA d/t INR>1.7.  Patient to be admitted for stroke work-up.  Bedside handoff with ED RN Catalina Antigua.

## 2015-11-04 NOTE — Progress Notes (Signed)
ANTICOAGULATION CONSULT NOTE - Initial Consult  Pharmacy Consult for warfarin Indication: LVAD  Allergies  Allergen Reactions  . Demerol [Meperidine] Other (See Comments)    Paralysis. Could only move eyes.    Patient Measurements: Height: 6' (182.9 cm) Weight: 212 lb 4.9 oz (96.3 kg) IBW/kg (Calculated) : 77.6  Vital Signs: Temp: 98.7 F (37.1 C) (12/30 1600) Temp Source: Oral (12/30 1600) BP: 104/84 mmHg (12/30 1600) Pulse Rate: 70 (12/30 1600)  Labs:  Recent Labs  11/03/15 11/04/15 1310 11/04/15 1319  HGB  --  10.8* 12.6*  HCT  --  33.5* 37.0*  PLT  --  183  --   APTT  --  44*  --   LABPROT  --  25.4*  --   INR 2.3 2.34*  --   CREATININE  --  1.55* 1.50*    Estimated Creatinine Clearance: 50.4 mL/min (by C-G formula based on Cr of 1.5).   Medical History: Past Medical History  Diagnosis Date  . Bradycardia   . Ventricular tachycardia ([place])     s/p AICD;   h/o ICD shock;  Amiodarone Rx. S/P ablation in 2012  . PVD (peripheral vascular disease) ([place])     h/o claudication;  s/p R fem to pop BPG 3/11;  s/p L CFA BPG 7/03;  s/p repair of inf AAA 6/03;  s/p aorta to bilat renal BPG 6/03  . HTN (hypertension)   . HLD (hyperlipidemia)   . Cytopenia   . Hypothyroidism   . Renal insufficiency   . Claudication ([place])   . Chronic systolic heart failure ([place])   . Ischemic cardiomyopathy     echo 7/12:  EF 25-30%, mild AI, mod MR, mod LAE  . TIA (transient ischemic attack) 2001  . CVD (cerebrovascular disease)     left CEA 2008  . Stroke ([place])   . Anemia   . Inappropriate therapy from implantable cardioverter-defibrillator 04/02/2013    ATP for sinus tach  SVT wavelet identified SVT but was no  passive   . Myocardial infarction ([place]) 1992  . Anginal pain ([place])   . Automatic implantable cardioverter-defibrillator in situ     Medtronic Evera device serial number [id number]    . Arthritis     "a little in my knees" (05/25/2013)  . 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 Dysrhythmia     HX A FIB  . History of nonmelanoma skin cancer     L EAR  . Kidney stone   . History of transfusion   . Sleep apnea     denies wearing mask (05/25/2013)  . CAD (coronary artery disease)     s/p MI 1982;  s/p DES to CFX 2011;  s/p NSTEMI 4/12 in setting of VTach;  cath 7/12:  LM ok, LAD mid 30-40%, Dx's with 40%, mCFX stent patent, prox to mid RCA occluded with R to R and L to R collats.  Medical Tx was continued  . CHF (congestive heart failure) ([facility])     HAS VENTRICULAR PUMP    Medications:  Prescriptions prior to admission  Medication Sig Dispense Refill Last Dose  . acetaminophen (TYLENOL) 325 MG tablet Take 650 mg by mouth every 6 (six) hours as needed (pain).   unknown  . amLODipine (NORVASC) 5 MG tablet Take 5 mg by mouth at bedtime.   11/03/2015 at Unknown time  . atorvastatin (LIPITOR) 80 MG  tablet Take 80 mg by mouth daily.    11/04/2015 at Unknown time  . docusate sodium (COLACE) 100 MG capsule Take 200 mg by mouth daily.    11/04/2015 at Unknown time  . ferrous gluconate (FERGON) 324 MG tablet Take 1 tablet (324 mg total) by mouth 2 (two) times daily with a meal. 60 tablet 6 11/04/2015 at Unknown time  . furosemide (LASIX) 20 MG tablet Take 1 tablet (20 mg total) by mouth daily. (Patient taking differently: Take 20 mg by mouth daily as needed for fluid. ) 30 tablet 0 Past Week at Unknown time  . levothyroxine (SYNTHROID, LEVOTHROID) 200 MCG tablet Take one 200 mcg daily before breakfast every day with 1 1/2 (300 mcg) every Monday. 45 tablet 6 11/04/2015 at Unknown time  . Multiple Vitamin (MULTIVITAMIN WITH MINERALS) TABS tablet Take 1 tablet by mouth at bedtime.   11/04/2015 at Unknown time  . pantoprazole (PROTONIX) 40 MG tablet Take 1 tablet (40 mg total) by mouth 2 (two) times daily. 60 tablet 6 11/04/2015 at Unknown time  . RANEXA 500 MG 12 hr tablet TAKE 1 TABLET (500 MG TOTAL) BY MOUTH  2 (TWO) TIMES DAILY. 60 tablet 6 11/04/2015 at Unknown time  . warfarin (COUMADIN) 2 MG tablet Take 2-4 mg by mouth daily. Take 4 mg by mouth Monday, Wednesday, and Friday. Take 2 mg by mouth all other days.   11/03/2015 at Unknown time  . zinc gluconate 50 MG tablet Take 100 mg by mouth at bedtime.    11/04/2015 at Unknown time    Assessment: 74 y/o male with LVAD who presents to the ED with left arm numbness and weakness. Code stroke called and tPA deferred as INR is therapeutic. Neuro suspects acute right subcortical TIA or possible acute small vessel stroke. Pharmacy consulted to manage warfarin inpatient. INR is therapeutic at 2.34. No bleeding noted, CBC normal.   PTA regimen: 2 mg on Tu/Th/Su, 4 mg on M/W/F, 1 mg on Sa per anticoag clinic note; pt reports 2 mg daily except 4 mg MWF, last dose 12/29  Goal of Therapy:  INR 2-2.5 per anticoag clinic note Monitor platelets by anticoagulation protocol: Yes   Plan:  - Warfarin 4 mg PO tonight - INR daily - Monitor for s/sx of bleeding  [facility], Kenneth.D., BCPS Clinical Pharmacist Pager: [phone] 11/04/2015 4:08 PM

## 2015-11-04 NOTE — Progress Notes (Signed)
LVAD Coordinator ED Encounter: Patient in ED with progression of LUE and now LLE weakness. Significant weakness on left side compared to right; no facial droop, visual changes or headache. Only complaints at this time is the progression of weakness in left side.   VAD Interrogation: Speed: 9000 Flow: 3.7 PI: 8.2 Power: 4.5w  Events: rare recently Alarms: none  Fixed Speed: 9000 Back Up Speed: 8400   MAP elevated at 103 currently. INR today is therapeutic however he has had two sub-therapeutic INRs (1.6) in the past month.   Plan: Neurology has seen and feels this is a TIA vs small CVA. Admitting to [facility] to observe for worsening or resolution of symptoms and any PT/OT/ST needs he may need outpatient.   Surgery scheduled for lumbar fusion now on 11/29/15 with Dr. Vertell Limber. Our team will contact him to inform of current events since it will likely post-pone the surgery.   Janene Madeira, RN VAD Coordinator   Office: [phone] 24/7 VAD Pager: [phone]

## 2015-11-04 NOTE — ED Notes (Signed)
LDH added on.

## 2015-11-05 ENCOUNTER — Inpatient Hospital Stay (HOSPITAL_COMMUNITY): Payer: Medicare Other

## 2015-11-05 ENCOUNTER — Encounter (HOSPITAL_COMMUNITY): Payer: Self-pay | Admitting: Radiology

## 2015-11-05 ENCOUNTER — Encounter (HOSPITAL_COMMUNITY): Payer: Medicare Other

## 2015-11-05 DIAGNOSIS — I6521 Occlusion and stenosis of right carotid artery: Secondary | ICD-10-CM

## 2015-11-05 DIAGNOSIS — I63131 Cerebral infarction due to embolism of right carotid artery: Secondary | ICD-10-CM

## 2015-11-05 DIAGNOSIS — I6789 Other cerebrovascular disease: Secondary | ICD-10-CM

## 2015-11-05 DIAGNOSIS — I639 Cerebral infarction, unspecified: Secondary | ICD-10-CM

## 2015-11-05 DIAGNOSIS — E785 Hyperlipidemia, unspecified: Secondary | ICD-10-CM

## 2015-11-05 DIAGNOSIS — I255 Ischemic cardiomyopathy: Secondary | ICD-10-CM | POA: Insufficient documentation

## 2015-11-05 DIAGNOSIS — I6529 Occlusion and stenosis of unspecified carotid artery: Secondary | ICD-10-CM | POA: Insufficient documentation

## 2015-11-05 DIAGNOSIS — Z7901 Long term (current) use of anticoagulants: Secondary | ICD-10-CM

## 2015-11-05 DIAGNOSIS — Z9889 Other specified postprocedural states: Secondary | ICD-10-CM | POA: Insufficient documentation

## 2015-11-05 LAB — CBC WITH DIFFERENTIAL/PLATELET
BASOS PCT: 0 %
Basophils Absolute: 0 10*3/uL (ref 0.0–0.1)
Eosinophils Absolute: 0.4 10*3/uL (ref 0.0–0.7)
Eosinophils Relative: 5 %
HEMATOCRIT: 34.1 % — AB (ref 39.0–52.0)
Hemoglobin: 10.9 g/dL — ABNORMAL LOW (ref 13.0–17.0)
Lymphocytes Relative: 11 %
Lymphs Abs: 0.8 10*3/uL (ref 0.7–4.0)
MCH: 30.7 pg (ref 26.0–34.0)
MCHC: 32 g/dL (ref 30.0–36.0)
MCV: 96.1 fL (ref 78.0–100.0)
MONO ABS: 0.8 10*3/uL (ref 0.1–1.0)
MONOS PCT: 11 %
NEUTROS ABS: 5.4 10*3/uL (ref 1.7–7.7)
Neutrophils Relative %: 73 %
Platelets: 194 10*3/uL (ref 150–400)
RBC: 3.55 MIL/uL — ABNORMAL LOW (ref 4.22–5.81)
RDW: 15.6 % — AB (ref 11.5–15.5)
WBC: 7.3 10*3/uL (ref 4.0–10.5)

## 2015-11-05 LAB — BASIC METABOLIC PANEL
ANION GAP: 10 (ref 5–15)
BUN: 16 mg/dL (ref 6–20)
CALCIUM: 8.9 mg/dL (ref 8.9–10.3)
CO2: 24 mmol/L (ref 22–32)
CREATININE: 1.28 mg/dL — AB (ref 0.61–1.24)
Chloride: 105 mmol/L (ref 101–111)
GFR calc Af Amer: >60 mL/min (ref >60)
GFR calc non Af Amer: 53 mL/min — ABNORMAL LOW (ref >60)
GLUCOSE: 112 mg/dL — AB (ref 65–99)
Potassium: 4.6 mmol/L (ref 3.5–5.1)
Sodium: 139 mmol/L (ref 135–145)

## 2015-11-05 LAB — PROTIME-INR
INR: 2.28 — ABNORMAL HIGH (ref 0.00–1.49)
PROTHROMBIN TIME: 24.9 s — AB (ref 11.6–15.2)

## 2015-11-05 LAB — LIPID PANEL
Cholesterol: 106 mg/dL (ref 0–200)
HDL: 33 mg/dL — ABNORMAL LOW (ref >40)
LDL CALC: 60 mg/dL (ref 0–99)
TRIGLYCERIDES: 67 mg/dL (ref <150)
Total CHOL/HDL Ratio: 3.2 RATIO
VLDL: 13 mg/dL (ref 0–40)

## 2015-11-05 LAB — LACTATE DEHYDROGENASE: LDH: 252 U/L — AB (ref 98–192)

## 2015-11-05 LAB — TSH: TSH: 1.711 u[IU]/mL (ref 0.350–4.500)

## 2015-11-05 MED ORDER — IOHEXOL 350 MG/ML SOLN
50.0000 mL | Freq: Once | INTRAVENOUS | Status: AC | PRN
  Administered 2015-11-05: 50 mL via INTRAVENOUS

## 2015-11-05 MED ORDER — WARFARIN SODIUM 2 MG PO TABS
2.0000 mg | ORAL_TABLET | Freq: Once | ORAL | Status: AC
  Administered 2015-11-05: 2 mg via ORAL
  Filled 2015-11-05: qty 1

## 2015-11-05 MED ORDER — PNEUMOCOCCAL VAC POLYVALENT 25 MCG/0.5ML IJ INJ
0.5000 mL | INJECTION | INTRAMUSCULAR | Status: AC | PRN
  Administered 2015-11-06: 0.5 mL via INTRAMUSCULAR
  Filled 2015-11-05: qty 0.5

## 2015-11-05 NOTE — Progress Notes (Signed)
   Daily Progress Note  Asked to comment on the right neck CTA.  Pt has a high grade stenosis in the right carotid system.  This irrelevant as the patient is not a surgical or percutaneous candidate due to need for persistent anticoagulation to maintain function of his LVAD.  Will complete formal consultation tomorrow.   Adele Barthel, MD [facility] Office: [phone] Pager: [phone]  11/05/2015, 8:00 PM

## 2015-11-05 NOTE — Progress Notes (Signed)
  Echocardiogram 2D Echocardiogram has been performed.  [name] 11/05/2015, 1:58 PM

## 2015-11-05 NOTE — Progress Notes (Signed)
Orders received for swallow evaluation however per RN, patient has passed the RN stroke swallow screen and is tolerating diet. Defer formal evaluation per protocol. Please re-consult as needed.  Thank you  Janet Humphreys MA, CCC-SLP [phone]   

## 2015-11-05 NOTE — Progress Notes (Signed)
[facility] for warfarin Indication: LVAD  Allergies  Allergen Reactions  . Demerol [Meperidine] Other (See Comments)    Paralysis. Could only move eyes.    Patient Measurements: Height: 6' (182.9 cm) Weight: 188 lb 15 oz (85.7 kg) IBW/kg (Calculated) : 77.6  Vital Signs: Temp: 98.4 F (36.9 C) (12/31 0741) Temp Source: Oral (12/31 0741) BP: 105/78 mmHg (12/31 0800) Pulse Rate: 66 (12/31 0800)  Labs:  Recent Labs  11/03/15  11/04/15 1310 11/04/15 1319 11/05/15 0319  HGB  --   < > 10.8* 12.6* 10.9*  HCT  --   --  33.5* 37.0* 34.1*  PLT  --   --  183  --  194  APTT  --   --  44*  --   --   LABPROT  --   --  25.4*  --  24.9*  INR 2.3  --  2.34*  --  2.28*  CREATININE  --   --  1.55* 1.50* 1.28*  < > = values in this interval not displayed.  Estimated Creatinine Clearance: 53.9 mL/min (by C-G formula based on Cr of 1.28).   Assessment: 74 y/o male with LVAD who presents to the ED with left arm numbness and weakness. Code stroke called and tPA deferred as INR is therapeutic. Neuro suspects acute right subcortical TIA or possible acute small vessel stroke. Pharmacy consulted to manage warfarin inpatient. INR was therapeutic at 2.34 on admit.   INR continues to be at goal today at 2.28 (within lower range). Will alter home regimen slightly today with 2mg  tonight. Awaiting input by stroke team whether to add plavix.  PTA regimen: 2 mg on Tu/Th/Su, 4 mg on M/W/F, 1 mg on Sa per anticoag clinic note; pt reports 2 mg daily except 4 mg MWF, last dose 12/29  Goal of Therapy:  INR 2-2.5 per anticoag clinic note Monitor platelets by anticoagulation protocol: Yes   Plan:  - Warfarin 2 mg PO tonight - INR daily - Monitor for s/sx of bleeding  Erin Hearing PharmD., BCPS Clinical Pharmacist Pager [phone] 11/05/2015 9:42 AM

## 2015-11-05 NOTE — Progress Notes (Signed)
Patient ID: [name], male   DOB: [birth year], 74 y.o.   MRN: [id number]  HeartMate 2 Rounding Note  Subjective:    No problems overnight.  Left arm and leg still with some weakness but improved.    LVAD INTERROGATION:  HeartMate II LVAD:  Flow 3.9 liters/min, speed 9000, power 4.8, PI 8.4.  No PI events.   Objective:    Vital Signs:   Temp:  [97.7 F (36.5 C)-99 F (37.2 C)] 98.4 F (36.9 C) (12/31 0741) Pulse Rate:  [59-82] 71 (12/31 0741) Resp:  [12-27] 25 (12/31 0741) BP: (73-125)/(53-101) 109/94 mmHg (12/31 0741) SpO2:  [89 %-100 %] 94 % (12/31 0741) Weight:  [186 lb (84.369 kg)-212 lb 4.9 oz (96.3 kg)] 188 lb 15 oz (85.7 kg) (12/31 0341) Last BM Date: 11/04/15 Mean arterial Pressure 80s-90s  Intake/Output:   Intake/Output Summary (Last 24 hours) at 11/05/15 0823 Last data filed at 11/05/15 0600  Gross per 24 hour  Intake    237 ml  Output   1025 ml  Net   -788 ml     Physical Exam: General:  Well appearing. No resp difficulty HEENT: normal Neck: supple. JVP not elevated. Carotids 2+ bilat; no bruits. No lymphadenopathy or thryomegaly appreciated. Cor: Mechanical heart sounds with LVAD hum present. Lungs: clear Abdomen: soft, nontender, nondistended. No hepatosplenomegaly. No bruits or masses. Good bowel sounds. Driveline: C/D/I; securement device intact and driveline incorporated Extremities: no cyanosis, clubbing, rash, edema Neuro: alert & orientedx3, cranial nerves grossly intact. Left arm and leg 4/5 strength. Affect pleasant  Telemetry: NSR 70s  Labs: Basic Metabolic Panel:  Recent Labs Lab 11/04/15 1310 11/04/15 1319 11/05/15 0319  NA 138 139 139  K 5.0 5.0 4.6  CL 105 104 105  CO2 23  --  24  GLUCOSE 95 92 112*  BUN 19 24* 16  CREATININE 1.55* 1.50* 1.28*  CALCIUM 8.9  --  8.9    Liver Function Tests:  Recent Labs Lab 11/04/15 1310  AST 26  ALT 18  ALKPHOS 122  BILITOT 0.8  PROT 6.4*  ALBUMIN 3.5   No results for  input(s): LIPASE, AMYLASE in the last 168 hours. No results for input(s): AMMONIA in the last 168 hours.  CBC:  Recent Labs Lab 11/04/15 1310 11/04/15 1319 11/05/15 0319  WBC 6.7  --  7.3  NEUTROABS 4.6  --  5.4  HGB 10.8* 12.6* 10.9*  HCT 33.5* 37.0* 34.1*  MCV 97.1  --  96.1  PLT 183  --  194    INR:  Recent Labs Lab 11/03/15 11/04/15 1310 11/05/15 0319  INR 2.3 2.34* 2.28*    Other results:  EKG:   Imaging: Ct Head Wo Contrast  11/04/2015  CLINICAL DATA:  Left arm and left leg weakness. EXAM: CT HEAD WITHOUT CONTRAST TECHNIQUE: Contiguous axial images were obtained from the base of the skull through the vertex without intravenous contrast. COMPARISON:  CT scan of October 15, 2013. FINDINGS: Bony calvarium appears intact. Moderate diffuse cortical atrophy is noted. Mild chronic ischemic white matter disease is noted. No mass effect or midline shift is noted. Ventricular size is within normal limits. There is no evidence of mass lesion, hemorrhage or acute infarction. IMPRESSION: Mild chronic ischemic white matter disease. Moderate diffuse cortical atrophy. No acute intracranial abnormality seen. These results were called by telephone at the time of interpretation on 11/04/2015 at 1:28 pm to Dr. Nicole Kindred, who verbally acknowledged these results. Electronically Signed   By: Jeneen Rinks  Murlean Caller, M.D.   On: 11/04/2015 13:30      Medications:     Scheduled Medications: . amLODipine  5 mg Oral Daily  . atorvastatin  80 mg Oral Daily  . docusate sodium  200 mg Oral Daily  . ferrous gluconate  324 mg Oral BID WC  . [START ON 11/07/2015] levothyroxine  100 mcg Oral Q Mon  . levothyroxine  200 mcg Oral QAC breakfast  . multivitamin with minerals  1 tablet Oral QHS  . pantoprazole  40 mg Oral BID  . ranolazine  500 mg Oral BID  . 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 Presented to [facility] with left sided weakness. Has had 2x subtherapeutic INRs in the last 1 month.   1. CVA: Suspected with left-sided weakness.  Improved this morning but still present.  Initial CT negative for acute abnormality. Neuro to observe currently and decided against aggressive intervention given mild deficit. Passed swallow evaluation.  - CTA head/neck to be done today.  - PT/OT/ST to see.  - Will get Echo and Carotid US.   - He has been on coumadin with goal INR 2-2.5, currently therapeutic (will continue).  History of GI bleeding with discontinuation of aspirin.  Would favor addition of Plavix at this point to his regimen but neurologist yesterday recommended discussion about this with the stroke team today so not ordered. 2. Chronic systolic HF: s/p LVAD [id number]. Volume status stable. Can get po lasix as needed. 3.Atrial arrhythmias: Atrial fibrillation, atrial tachycardia. S/p DC-CV 06/04/14 and repeat DC-CV 03/02/15.Interrogation of device revealed atrial fibrillation briefly on 11/22 and 12/1.  He is in NSR currently and INR therapeutic. Ranexa to help decrease risk of recurrence.  4. HTN: MAP a bit high currently, 80s-90s.  In setting of acute CVA, will not add medication to lower.  If it remains up, will need to increase amlodipine.  5. LVAD: LVAD parameters reviewed personally, stable.  No evidence for large pump thrombus => LDH stable, LVAD parameters stable.  6. CKD, stage III: Creatinine stable. 7. Anticoagulation management: Goal INR 2-2.5. He is off ASA with GI bleeding. See above discussion about considering Plavix addition, await neuro input.  8. 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 Will have to watch closely if Plavix added. 9. Lumbar  compression fracture: Had surgery scheduled for 11/29/15. This will likely be further delayed.   I reviewed the LVAD parameters from today, and compared the results to the patient's prior recorded data.  No programming changes were made.  The LVAD is functioning within specified parameters.  The patient performs LVAD self-test daily.  LVAD interrogation was negative for any significant power changes, alarms or PI events/speed drops.  LVAD equipment check completed and is in good working order.  Back-up equipment present.   LVAD education done on emergency procedures and precautions and reviewed exit site care.  Length of Stay: [place] 11/05/2015, 8:23 AM  VAD Team --- VAD ISSUES ONLY--- Pager [phone] (7am - 7am)  Advanced Heart Failure Team  Pager [phone] (M-F; 7a - 4p)  Please contact [facility] for night-coverage after  hours (4p -7a ) and weekends on [email or web address]

## 2015-11-05 NOTE — Progress Notes (Signed)
STROKE TEAM PROGRESS NOTE   HISTORY [name] is an 74 y.o. male with a history of CHF with LVAD, coronary artery disease, hypertension, left CEA in TIA, presenting with acute onset of weakness and numbness involving left arm and left leg at 11:30 AM today. Patient is on anticoagulation Coumadin, and INR today was 2.34. CT scan of his head showed no acute intracranial abnormality. He had persistent numbness and mild weakness involving his left arm as well as numbness of the left side of his face and left leg. There were no changes in speech. No facial droop was noted. NIH stroke score was 2.  LSN: 11:30 AM on 11/04/2015 tPA Given: No: INR therapeutic on Coumadin; mild deficits only mRankin:   SUBJECTIVE (INTERVAL HISTORY) No family members present initially. The patient's girlfriend arrived during our visit. The patient reports that he has had some improvement in the strength of his left arm and leg since last night; however, they are still not back to baseline. The patient is followed by Dr. Kellie Simmering for his PVD. He is due to see Dr. Kellie Simmering in June. 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    OBJECTIVE Temp:  [97.7 F (36.5 C)-99 F (37.2 C)] 98.4 F (36.9 C) (12/31 0741) Pulse Rate:  [59-82] 71 (12/31 0741) Cardiac Rhythm:  [-] Atrial paced (12/31 0700) Resp:  [12-27] 25 (12/31 0741) BP: (73-125)/(53-101) 109/94 mmHg (12/31 0741) SpO2:  [89 %-100 %] 94 % (12/31 0741) Weight:  [84.369 kg (186 lb)-96.3 kg (212 lb 4.9 oz)] 85.7 kg (188 lb 15 oz) (12/31 0341)  CBC:   Recent Labs Lab 11/04/15 1310 11/04/15 1319 11/05/15 0319  WBC 6.7  --  7.3  NEUTROABS 4.6  --  5.4  HGB 10.8* 12.6* 10.9*  HCT 33.5* 37.0* 34.1*  MCV 97.1  --  96.1  PLT 183  --  [id number]    Basic Metabolic Panel:   Recent Labs Lab 11/04/15 1310 11/04/15 1319 11/05/15 0319  NA 138 139 139  K 5.0 5.0 4.6  CL 105 104 105  CO2 23  --  24  GLUCOSE 95 92 112*  BUN 19 24* 16   CREATININE 1.55* 1.50* 1.28*  CALCIUM 8.9  --  8.9    Lipid Panel:     Component Value Date/Time   CHOL 118 11/28/2012 1023   TRIG 48.0 11/28/2012 1023   HDL 50.80 11/28/2012 1023   CHOLHDL 2 11/28/2012 1023   VLDL 9.6 11/28/2012 1023   LDLCALC 58 11/28/2012 1023   HgbA1c:  Lab Results  Component Value Date   HGBA1C 5.5 10/18/2013   Urine Drug Screen: No results found for: LABOPIA, COCAINSCRNUR, LABBENZ, AMPHETMU, THCU, LABBARB    IMAGING I have personally reviewed the radiological images below and agree with the radiology interpretations. Blue text is my interpretation.  Ct Head Wo Contrast 11/04/2015   Mild chronic ischemic white matter disease. Moderate diffuse cortical atrophy. No acute intracranial abnormality seen.   CTA Head and Neck  11/05/2015 No flow reducing intracranial or extracranial atherosclerotic lesion. No findings of acute cerebral infarction on the basis of CT/CTA. A small subcortical acute infarct could be difficult to visualize, in the setting of chronic small vessel disease. By my interpretation, pt has right distal CCA close to 70% stenosis, and close to 50% right proximal ICA. In addition, pt has bilateral ICA siphone atherosclerotic changes without flow limiting stenosis.   TTE - Left ventricle: The  cavity size was severely dilated. Systolic function was severely reduced. The estimated ejection fraction was in the range of 5-10%. Diffuse hypokinesis. The study is not technically sufficient to allow evaluation of LV diastolic function. - Aortic valve: Aortic valve opens minimally. There was no regurgitation. - Mitral valve: There was no regurgitation. - Left atrium: The atrium was moderately dilated. - Right ventricle: The cavity size was moderately dilated. Wall thickness was normal. Systolic function was severely reduced. - Right atrium: Pacer wire or catheter noted in right atrium. - Tricuspid valve: There was trivial  regurgitation. Impressions: - This was a limited study. LVAD Inflow and outflow cannulas are not visualized. No obvious intracardiac source of embolism was seen.   PHYSICAL EXAM  Temp:  [98.4 F (36.9 C)-99.3 F (37.4 C)] 99.3 F (37.4 C) (12/31 1640) Pulse Rate:  [66-82] 70 (12/31 1137) Resp:  [12-25] 14 (12/31 1137) BP: (73-121)/(53-101) 103/90 mmHg (12/31 1600) SpO2:  [89 %-97 %] 93 % (12/31 1137) Weight:  [188 lb 15 oz (85.7 kg)] 188 lb 15 oz (85.7 kg) (12/31 0341)  General - Well nourished, well developed, in no apparent distress.  Ophthalmologic - Fundi not visualized due to eye movement.  Cardiovascular - Regular rate and rhythm.  Mental Status -  Level of arousal and orientation to time, place, and person were intact. Language including expression, naming, repetition, comprehension was assessed and found intact. Fund of Knowledge was assessed and was intact.  Cranial Nerves II - XII - II - Visual field intact OU. III, IV, VI - Extraocular movements intact. V - Facial sensation intact bilaterally. VII - Facial movement intact bilaterally. VIII - Hearing & vestibular intact bilaterally. X - Palate elevates symmetrically. XI - Chin turning & shoulder shrug intact bilaterally. XII - Tongue protrusion intact.  Motor Strength - The patient's strength was normal in all extremities except LUE 4/5 with pronator drift and dexterity difficulty.  Bulk was normal and fasciculations were absent.   Motor Tone - Muscle tone was assessed at the neck and appendages and was normal.  Reflexes - The patient's reflexes were 1+ in all extremities and he had no pathological reflexes.  Sensory - Light touch, temperature/pinprick were assessed and were symmetrical.    Coordination - The patient had normal movements in the hands and feet with no ataxia or dysmetria.  Tremor was absent.  Gait and Station - not tested.   ASSESSMENT/PLAN [name] is a 74 y.o. male with  history of CHF with LVAD, coronary artery disease with MI, hyperlipidemia, cardiomyopathy,  hypertension, TIA and left CEA in 2008, presenting with weakness and numbness involving left arm and left leg.  He did not receive IV t-PA due to coumadin therapy.  Stroke:  Right hemisphere stroke, etiology unclear, could be embolic or large vessel disease. 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    Resultant  improvement in left hemiparesis but not resolved.  CTA head and neck - right CCA 70% and ICA 50% stenosis.  2D Echo - EF 5-10%  LDL - 58  HgbA1c - pending  VTE prophylaxis - Coumadin Diet regular Room service appropriate?: Yes; Fluid consistency:: Thin  warfarin daily prior to admission, now on warfarin daily. Due to significant ICA stenosis as well as intracranial athero, antiplatelet on top of anticoagulation is recommended from stroke standpoint. Pt had previous GIB on ASA, however, jejunal AVMs have been  clipped. I would recommend either ASA or plavix on top of coumdain for stroke prevention.   In addition, recommend vascular surgery consultation for symptomatic right CCA/ICA stenosis (Dr. Bridgett Larsson is aware).  Patient counseled to be compliant with his antithrombotic medications  Ongoing aggressive stroke risk factor management  Therapy recommendations: Pending  Disposition:  Pending  Carotid stenosis, right  Right CCA 70% and ICA 50% on CTA neck  03/2015 CUS showed right ICA 50-69% stenosis   Pt follows with Dr. Kellie Simmering in clinic  Symptomatic this time with left sided symptoms.  recommend vascular surgery consultation for symptomatic right CCA/ICA stenosis (Dr. Bridgett Larsson is aware).  Recommend antiplatelet therapy either ASA or plavix on top of coumadin  Cardiomyopathy  EF 5-10%  On LVAD  On coumadin  INR goal 2-2.5  Hx of GIB  2 jejunal AVMs s/p clipping in  02/2015  No more GIB since  Feels safe to start antiplatelet therapy.  Hypertension  Blood pressure tends to run somewhat low  Permissive hypertension (OK if < 180/120) but gradually normalize in 5-7 days.  Hyperlipidemia  Home meds:  Atorvastatin 80 mg daily resumed in hospital  LDL 58, goal < 70  Continue statin at discharge  Other Stroke Risk Factors  Advanced age  Cigarette smoker, quit smoking 14 years ago.  ETOH use  Hx of TIA  Family hx stroke (father)  Coronary artery disease  Obstructive sleep apnea - no CPAP  Other Active Problems  Anemia  Mildly elevated creatinine  Hospital day # 1  Rosalin Hawking, MD PhD Stroke Neurology 11/05/2015 6:09 PM   To contact Stroke Continuity provider, please refer to [email or web address] After hours, contact General Neurology

## 2015-11-06 ENCOUNTER — Inpatient Hospital Stay (HOSPITAL_COMMUNITY): Payer: Medicare Other

## 2015-11-06 DIAGNOSIS — I639 Cerebral infarction, unspecified: Secondary | ICD-10-CM

## 2015-11-06 DIAGNOSIS — I6521 Occlusion and stenosis of right carotid artery: Secondary | ICD-10-CM

## 2015-11-06 DIAGNOSIS — I6340 Cerebral infarction due to embolism of unspecified cerebral artery: Secondary | ICD-10-CM | POA: Diagnosis not present

## 2015-11-06 LAB — CBC WITH DIFFERENTIAL/PLATELET
BASOS ABS: 0 10*3/uL (ref 0.0–0.1)
Basophils Relative: 0 %
EOS PCT: 3 %
Eosinophils Absolute: 0.3 10*3/uL (ref 0.0–0.7)
HEMATOCRIT: 33.4 % — AB (ref 39.0–52.0)
HEMOGLOBIN: 10.9 g/dL — AB (ref 13.0–17.0)
LYMPHS ABS: 0.7 10*3/uL (ref 0.7–4.0)
LYMPHS PCT: 8 %
MCH: 31 pg (ref 26.0–34.0)
MCHC: 32.6 g/dL (ref 30.0–36.0)
MCV: 94.9 fL (ref 78.0–100.0)
Monocytes Absolute: 1.1 10*3/uL — ABNORMAL HIGH (ref 0.1–1.0)
Monocytes Relative: 14 %
NEUTROS ABS: 6 10*3/uL (ref 1.7–7.7)
Neutrophils Relative %: 75 %
Platelets: 194 10*3/uL (ref 150–400)
RBC: 3.52 MIL/uL — AB (ref 4.22–5.81)
RDW: 15.6 % — ABNORMAL HIGH (ref 11.5–15.5)
WBC: 8.1 10*3/uL (ref 4.0–10.5)

## 2015-11-06 LAB — BASIC METABOLIC PANEL
ANION GAP: 9 (ref 5–15)
BUN: 20 mg/dL (ref 6–20)
CHLORIDE: 103 mmol/L (ref 101–111)
CO2: 26 mmol/L (ref 22–32)
Calcium: 8.9 mg/dL (ref 8.9–10.3)
Creatinine, Ser: 1.34 mg/dL — ABNORMAL HIGH (ref 0.61–1.24)
GFR calc Af Amer: 58 mL/min — ABNORMAL LOW (ref >60)
GFR, EST NON AFRICAN AMERICAN: 50 mL/min — AB (ref >60)
GLUCOSE: 114 mg/dL — AB (ref 65–99)
POTASSIUM: 4.7 mmol/L (ref 3.5–5.1)
SODIUM: 138 mmol/L (ref 135–145)

## 2015-11-06 LAB — LACTATE DEHYDROGENASE: LDH: 236 U/L — ABNORMAL HIGH (ref 98–192)

## 2015-11-06 LAB — PROTIME-INR
INR: 2.27 — AB (ref 0.00–1.49)
Prothrombin Time: 24.8 seconds — ABNORMAL HIGH (ref 11.6–15.2)

## 2015-11-06 MED ORDER — CLOPIDOGREL BISULFATE 75 MG PO TABS
75.0000 mg | ORAL_TABLET | Freq: Every day | ORAL | Status: DC
  Administered 2015-11-06 – 2015-11-07 (×2): 75 mg via ORAL
  Filled 2015-11-06 (×2): qty 1

## 2015-11-06 MED ORDER — WARFARIN SODIUM 2 MG PO TABS
2.0000 mg | ORAL_TABLET | Freq: Once | ORAL | Status: AC
  Administered 2015-11-06: 2 mg via ORAL
  Filled 2015-11-06: qty 1

## 2015-11-06 NOTE — Consult Note (Addendum)
Hospital Consult    Reason for Consult:  Left sided weakness and right carotid stenosis Referring Physician:  Neurology MRN #:  [id number]  History of Present Illness: This is a 75 y.o. male who has hx of VT, PAD with hx of aortobifemoral bypass grafting in 2003, a left CEA, and BLE bypasses in the past by Dr. Kellie Simmering.  He has severe systolic HR (EF 0000000) due to mixed ischemic/nonischemic CM & is s/p LVAD implant in 2014.   Pt states he was eating lunch with his girlfriend and his left hand just got floppy.  He states that he has weakness in his left arm and left leg.  He says this has improved everyday and continues to improve.  He did not have any visual changes or speech deficits.  His INR on admit was 2.34, but did have 2 sub-therapuetic INR's in the past month.    He did have a CTA of the head/neck, which reveal a heavily calcified distal common carotid artery without flow reducing lesion.  Luminal measurement of 2.8 mm cervical ICA diameter of 4.2 mm, non flow reducing. Additional the included calcific plaque is seen in the proximal RIGHT ICA, again without flow reducing lesion. No soft plaque or ulceration. Dolichoectatic cervical ICA. No dissection.  Left carotid system: No evidence of dissection, stenosis (50% or greater) or occlusion.  Vertebral arteries: Dominant RIGHT vertebral. Minor ostial calcification without flow reducing lesion. No evidence of dissection, stenosis (50% or greater) or occlusion.  In May 2016, he did have a carotid duplex in our office, which revealed right ICA with moderate calcific plaque, which appears to be on the low end of 50-69%.  The left ICA was widely patent.  ABI's also in May 2016 revealed a right ABI of 1.13 and a left ABI of 1.23.  He has been off of aspirin with GIB in 4/16.  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  Neurology was consulted and recommends vascular consult as well as antiplatelet tx with either aspirin or Plavix on top of  coumadin.  He is on a statin for cholesterol management.  He is on coumadin for his LVAD.    Past Medical History  Diagnosis Date  . Bradycardia   . Ventricular tachycardia ([place])     s/p AICD;   h/o ICD shock;  Amiodarone Rx. S/P ablation in 2012  . PVD (peripheral vascular disease) ([place])     h/o claudication;  s/p R fem to pop BPG 3/11;  s/p L CFA BPG 7/03;  s/p repair of inf AAA 6/03;  s/p aorta to bilat renal BPG 6/03  . HTN (hypertension)   . HLD (hyperlipidemia)   . Cytopenia   . Hypothyroidism   . Renal insufficiency   . Claudication ([place])   . Chronic systolic heart failure ([place])   . Ischemic cardiomyopathy     echo 7/12:  EF 25-30%, mild AI, mod MR, mod LAE  . TIA (transient ischemic attack) 2001  . CVD (cerebrovascular disease)     left CEA 2008  . Stroke ([place])   . Anemia   . Inappropriate therapy from implantable cardioverter-defibrillator 04/02/2013    ATP for sinus tach  SVT wavelet identified SVT but was no  passive   . Myocardial infarction ([place]) 1992  . Anginal pain ([place])   . Automatic implantable cardioverter-defibrillator in situ     Medtronic Evera device serial number [id number]    . Arthritis     "a little in my knees" (05/25/2013)  .  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 Dysrhythmia     HX A FIB  . History of nonmelanoma skin cancer     L EAR  . Kidney stone   . History of transfusion   . Sleep apnea     denies wearing mask (05/25/2013)  . CAD (coronary artery disease)     s/p MI 1982;  s/p DES to CFX 2011;  s/p NSTEMI 4/12 in setting of VTach;  cath 7/12:  LM ok, LAD mid 30-40%, Dx's with 40%, mCFX stent patent, prox to mid RCA occluded with R to R and L to R collats.  Medical Tx was continued  . CHF (congestive heart failure) ([place])     HAS VENTRICULAR PUMP    Past Surgical History  Procedure Laterality Date  . Femoral-popliteal bypass graft Right 2011  . Cardiac defibrillator  placement  2003; 2005; 05/25/2013    2014: Medtronic Evera device serial number [id number]  . Revascularization / in-situ graft leg    . Renal artery bypass Bilateral 2003  . Back surgery    . Lumbar disc surgery  1974; 2000's  . Knee arthroscopy Bilateral 1990's  . Elbow arthroscopy Right 1990's  . Cholecystectomy  12/15/?2010  . Lipoma excision Left 2013    "near ear" (05/25/2013)  . Heel spur surgery Right 1990's  . Cataract extraction w/ intraocular lens  implant, bilateral Bilateral 2000  . Carotid endarterectomy Left ~ 2007  . Doppler echocardiography  2011  . Tonsillectomy  1946  . Femoral-popliteal bypass graft Left 05/2002    Archie Endo 05/06/2002 (05/25/2013)  . Tumor excision Left 1960's    "fatty tumor" (05/25/2013)  . Cardiac electrophysiology study and ablation  2012  . Coronary angioplasty  1992  . Cardiac catheterization      "several" (05/25/2013)  . Coronary angioplasty with stent placement      "last one was 11/2012  (05/25/2013)  . Cardioversion N/A 10/15/2013    Procedure: CARDIOVERSION;  Surgeon: Jolaine Artist, MD;  Location: [place];  Service: Cardiovascular;  Laterality: N/A;  . Insertion of implantable left ventricular assist device N/A 10/19/2013    Procedure: INSERTION OF IMPLANTABLE LEFT VENTRICULAR ASSIST DEVICE;  Surgeon: Gaye Pollack, MD;  Location: [place];  Service: Open Heart Surgery;  Laterality: N/A;  CIRC ARREST  NITRIC OXIDE  MEDTRONIC ICD  . Intraoperative transesophageal echocardiogram N/A 10/19/2013    Procedure: INTRAOPERATIVE TRANSESOPHAGEAL ECHOCARDIOGRAM;  Surgeon: Gaye Pollack, MD;  Location: [facility] OR;  Service: Open Heart Surgery;  Laterality: N/A;  . Tee without cardioversion N/A 11/04/2013    Procedure: TRANSESOPHAGEAL ECHOCARDIOGRAM (TEE);  Surgeon: Larey Dresser, MD;  Location: [place];  Service: Cardiovascular;  Laterality: N/A;  . Cardioversion N/A 11/04/2013    Procedure: CARDIOVERSION;  Surgeon: Larey Dresser, MD;  Location:  [place];  Service: Cardiovascular;  Laterality: N/A;  . Cardioversion N/A 06/04/2014    Procedure: CARDIOVERSION;  Surgeon: Jolaine Artist, MD;  Location: [facility] ENDOSCOPY;  Service: Cardiovascular;  Laterality: N/A;  . Cystoscopy with retrograde pyelogram, ureteroscopy and stent placement Left 08/09/2014    Procedure: [place], URETEROSCOPY AND STENT PLACEMENT;  Surgeon: Bernestine Amass, MD;  Location: [facility] ORS;  Service: Urology;  Laterality: Left;  . Cystoscopy with retrograde pyelogram, ureteroscopy and stent placement Left 09/01/2014    Procedure: CYSTOSCOPY WITH URETEROSCOPY AND STENT REMOVAL;  Surgeon: Shanon Brow  Cy Blamer, MD;  Location: [facility]S;  Service: Urology;  Laterality: Left;  . Holmium laser application Left [id number]    Procedure: HOLMIUM LASER APPLICATION;  Surgeon: Bernestine Amass, MD;  Location: [facility];  Service: Urology;  Laterality: Left;  . Cystoscopy with retrograde pyelogram, ureteroscopy and stent placement Left 09/20/2014    Procedure: CYSTOSCOPY WITH RETROGRADE PYELOGRAM, URETEROSCOPY AND STENT PLACEMENT, stone removal;  Surgeon: Bernestine Amass, MD;  Location: [facility];  Service: Urology;  Laterality: Left;  Stephanie Coup ablation N/A 09/12/2011    Procedure: V-TACH ABLATION;  Surgeon: Evans Lance, MD;  Location: [facility] CATH LAB;  Service: Cardiovascular;  Laterality: N/A;  . Left heart catheterization with coronary angiogram N/A 11/24/2012    Procedure: LEFT HEART CATHETERIZATION WITH CORONARY ANGIOGRAM;  Surgeon: Peter M Martinique, MD;  Location: [facility] CATH LAB;  Service: Cardiovascular;  Laterality: N/A;  . Implantable cardioverter defibrillator generator change N/A 05/25/2013    Procedure: IMPLANTABLE CARDIOVERTER DEFIBRILLATOR GENERATOR CHANGE;  Surgeon: Deboraha Sprang, MD;  Location: [facility] CATH LAB;  Service: Cardiovascular;  Laterality: N/A;  . Lead revision N/A 06/05/2013    Procedure: LEAD REVISION;  Surgeon: Thompson Grayer, MD;  Location: [facility] CATH LAB;  Service:  Cardiovascular;  Laterality: N/A;  . V-tach ablation N/A 06/08/2013    Procedure: V-TACH ABLATION;  Surgeon: Evans Lance, MD;  Location: [facility] CATH LAB;  Service: Cardiovascular;  Laterality: N/A;  . Right heart catheterization N/A 09/17/2013    Procedure: RIGHT HEART CATH;  Surgeon: Jolaine Artist, MD;  Location: [facility] CATH LAB;  Service: Cardiovascular;  Laterality: N/A;  . Right heart catheterization N/A 10/14/2013    Procedure: RIGHT HEART CATH;  Surgeon: Jolaine Artist, MD;  Location: [facility] CATH LAB;  Service: Cardiovascular;  Laterality: N/A;  . Right heart catheterization N/A 11/16/2013    Procedure: RIGHT HEART CATH;  Surgeon: Jolaine Artist, MD;  Location: [facility] CATH LAB;  Service: Cardiovascular;  Laterality: N/A;  . Right heart catheterization N/A 07/13/2014    Procedure: RIGHT HEART CATH;  Surgeon: Jolaine Artist, MD;  Location: [facility] CATH LAB;  Service: Cardiovascular;  Laterality: N/A;  . Esophagogastroduodenoscopy N/A 02/15/2015    Procedure: ESOPHAGOGASTRODUODENOSCOPY (EGD);  Surgeon: Carol Ada, MD;  Location[facility] ENDOSCOPY;  Service: Endoscopy;  Laterality: N/A;  LVAD  . Tee without cardioversion N/A 03/02/2015    Procedure: TRANSESOPHAGEAL ECHOCARDIOGRAM (TEE);  Surgeon: Jolaine Artist, MD;  Location: [facility] ENDOSCOPY;  Service: Cardiovascular;  Laterality: N/A;  . Cardioversion N/A 03/02/2015    Procedure: CARDIOVERSION;  Surgeon: Jolaine Artist, MD;  Location: [facility] ENDOSCOPY;  Service: Cardiovascular;  Laterality: N/A;    Allergies  Allergen Reactions  . Demerol [Meperidine] Other (See Comments)    Paralysis. Could only move eyes.    Prior to Admission medications   Medication Sig Start Date End Date Taking? Authorizing Provider  acetaminophen (TYLENOL) 325 MG tablet Take 650 mg by mouth every 6 (six) hours as needed (pain).   Yes Historical Provider, MD  amLODipine (NORVASC) 5 MG tablet Take 5 mg by mouth at bedtime.   Yes Historical Provider, MD  atorvastatin  (LIPITOR) 80 MG tablet Take 80 mg by mouth daily.    Yes Historical Provider, MD  docusate sodium (COLACE) 100 MG capsule Take 200 mg by mouth daily.    Yes Historical Provider, MD  ferrous gluconate (FERGON) 324 MG tablet Take 1 tablet (324 mg total) by mouth 2 (two) times daily with a meal. 06/23/15  Yes  Amy Estrella Deeds, NP  furosemide (LASIX) 20 MG tablet Take 1 tablet (20 mg total) by mouth daily. Patient taking differently: Take 20 mg by mouth daily as needed for fluid.  09/21/15  Yes Julianne Rice, MD  levothyroxine (SYNTHROID, LEVOTHROID) 200 MCG tablet Take one 200 mcg daily before breakfast every day with 1 1/2 (300 mcg) every Monday. 10/26/15  Yes Jolaine Artist, MD  Multiple Vitamin (MULTIVITAMIN WITH MINERALS) TABS tablet Take 1 tablet by mouth at bedtime.   Yes Historical Provider, MD  pantoprazole (PROTONIX) 40 MG tablet Take 1 tablet (40 mg total) by mouth 2 (two) times daily. 02/24/15  Yes Amy D Clegg, NP  RANEXA 500 MG 12 hr tablet TAKE 1 TABLET (500 MG TOTAL) BY MOUTH 2 (TWO) TIMES DAILY. 10/17/15  Yes Larey Dresser, MD  warfarin (COUMADIN) 2 MG tablet Take 2-4 mg by mouth daily. Take 4 mg by mouth Monday, Wednesday, and Friday. Take 2 mg by mouth all other days.   Yes Historical Provider, MD  zinc gluconate 50 MG tablet Take 100 mg by mouth at bedtime.    Yes Historical Provider, MD    Social History   Social History  . Marital Status: Divorced    Spouse Name: N/A  . Number of Children: 2  . Years of Education: N/A   Occupational History  . retired     [employer]  . 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 Smoking status: Former Smoker -- 0.50 packs/day for 37 years    Types: Cigarettes    Quit date: 11/05/2001  . Smokeless tobacco: Never Used  . Alcohol Use: 0.0 oz/week     Comment: 05/25/2013 "mixed drink couple times/month"  . Drug Use: No  . Sexual Activity: No   Other Topics Concern   . Not on file   Social History Narrative   Lives with sig other.     Family History  Problem Relation Age of Onset  . Coronary artery disease    . Heart attack Mother   . Coronary artery disease Mother   . Cancer Mother   . Heart disease Mother   . Hyperlipidemia Mother   . Hypertension Mother   . Coronary artery disease Father   . Stroke Father   . Cancer Father   . Heart disease Father     before age 62  . Hyperlipidemia Father   . Hypertension Father   . Heart attack Father     ROS: [x]  Positive   [ ]  Negative   [ ]  All sytems reviewed and are negative  Cardiovascular: []  chest pain/pressure []  palpitations [x]  ischemic CM [x]  LVAD placed in '14 [x]  hx AoBifem bypass '03 [x]  hx left CEA '07 [x]  BLE bypass grafting []  SOB lying flat []  DOE []  pain in legs while walking []  pain in legs at rest []  pain in legs at night []  non-healing ulcers []  hx of DVT []  swelling in legs  Pulmonary: []  productive cough []  asthma/wheezing []  home O2  Neurologic: [x]  weakness in [x]  left arm [x]  left leg []  numbness in []  arms []  legs [x]  hx of CVA []  mini stroke [] difficulty speaking or slurred speech []  temporary loss of vision in one eye []  dizziness  Hematologic: []  hx of cancer []  bleeding problems []  problems with blood clotting easily  Endocrine:   []  diabetes [x]   thyroid disease  GI []  vomiting blood []  blood in stool [x]  hx GIB with jejunal AVM's clipped  GU: []  CKD/renal failure []  HD--[]  M/W/F or []  T/T/S []  burning with urination []  blood in urine  Psychiatric: []  anxiety []  depression  Musculoskeletal: []  arthritis []  joint pain  Integumentary: []  rashes []  ulcers  Constitutional: []  fever []  chills   Physical Examination  Filed Vitals:   11/06/15 0401 11/06/15 0802  BP: 102/82   Pulse: 73 69  Temp: 99 F (37.2 C) 97.6 F (36.4 C)  Resp: 14 14   Body mass index is 25.32 kg/(m^2).  General:  WDWN in NAD Gait: Not  observed HENT: WNL, normocephalic Pulmonary: normal non-labored breathing, without Rales, rhonchi,  wheezing Cardiac: regular, without  Murmurs, rubs or gallops Abdomen:  soft, NT/ND, no masses; well healed laparotomy scar Skin: without rashes Vascular Exam/Pulses:  Extremities: without ischemic changes, without Gangrene , without cellulitis; without open wounds;  Musculoskeletal: no muscle wasting or atrophy  Neurologic: A&O X 3; Appropriate Affect; 4/5 LUE & LLE; 5/5 RUE/RLE  CBC    Component Value Date/Time   WBC 8.1 11/06/2015 0209   WBC 5.8 06/30/2015 1115   RBC 3.52* 11/06/2015 0209   RBC 3.36* 06/30/2015 1115   RBC 3.37* 11/14/2013 0500   HGB 10.9* 11/06/2015 0209   HCT 33.4* 11/06/2015 0209   HCT 32.0* 06/30/2015 1115   PLT 194 11/06/2015 0209   MCV 94.9 11/06/2015 0209   MCH 31.0 11/06/2015 0209   MCH 30.1 06/30/2015 1115   MCHC 32.6 11/06/2015 0209   MCHC 31.6 06/30/2015 1115   RDW 15.6* 11/06/2015 0209   RDW 17.8* 06/30/2015 1115   LYMPHSABS 0.7 11/06/2015 0209   LYMPHSABS 0.6* 06/30/2015 1115   MONOABS 1.1* 11/06/2015 0209   EOSABS 0.3 11/06/2015 0209   EOSABS 0.3 10/18/2014 1442   BASOSABS 0.0 11/06/2015 0209   BASOSABS 0.0 06/30/2015 1115    BMET    Component Value Date/Time   NA 138 11/06/2015 0209   NA 139 08/16/2014 1420   K 4.7 11/06/2015 0209   CL 103 11/06/2015 0209   CO2 26 11/06/2015 0209   GLUCOSE 114* 11/06/2015 0209   GLUCOSE 94 08/16/2014 1420   BUN 20 11/06/2015 0209   BUN 22 08/16/2014 1420   CREATININE 1.34* 11/06/2015 0209   CALCIUM 8.9 11/06/2015 0209   GFRNONAA 50* 11/06/2015 0209   GFRAA 58* 11/06/2015 0209    COAGS: Lab Results  Component Value Date   INR 2.27* 11/06/2015   INR 2.28* 11/05/2015   INR 2.34* 11/04/2015      CTA head/neck 11/05/15: Right carotid system: Heavily calcified distal common carotid artery without flow reducing lesion. Luminal measurement of 2.8 mm cervical ICA diameter of 4.2 mm, non flow  reducing. Additional the included calcific plaque is seen in the proximal RIGHT ICA, again without flow reducing lesion. No soft plaque or ulceration. Dolichoectatic cervical ICA. No dissection.  Left carotid system: No evidence of dissection, stenosis (50% or greater) or occlusion.  Vertebral arteries: Dominant RIGHT vertebral. Minor ostial calcification without flow reducing lesion. No evidence of dissection, stenosis (50% or greater) or occlusion.  No flow reducing intracranial or extracranial atherosclerotic lesion.  No findings of acute cerebral infarction on the basis of CT/CTA. A small subcortical acute infarct could be difficult to visualize, in the setting of chronic small vessel disease.  Non-Invasive Vascular Imaging:   Carotid duplex May 2016: In May 2016, he did have a carotid duplex  in our office, which revealed right ICA with moderate calcific plaque, which appears to be on the low end of 50-69%.  The left ICA was widely patent.  ABI's May 2016: ABI's also in May 2016 revealed a right ABI of 1.13 and a left ABI of 1.23.  Statin:  Yes.   Beta Blocker:  No. Aspirin:  No. ACEI:  No. ARB:  No. Other antiplatelets/anticoagulants:  Yes.   Coumadin   ASSESSMENT/PLAN: This is a 75 y.o. male pt of Dr. Kellie Simmering with symptomatic right carotid artery stenosis  -pt had left sided weakness that persists, but is continuing to improve.  He has had a CTA of the neck that reveals a calcific distal right CCA. -The pt has not had a carotid duplex this admission-will order this to compare to duplex in May. -He is on coumadin for his LVAD placement.  He states that he believes he will be able to bridge with heparin if needed to have a carotid endarterectomy b/c he is scheduled to have back surgery with this process in late January.  -Dr. Kellie Simmering will be back on Tuesday to evaluate the pt   Leontine Locket, PA-C [facility] [phone]  Addendum  I have  independently interviewed and examined the patient, and I agree with the physician assistant's findings.  Pt's neuro deficits correlate to his R carotid lesion.  On my review of the CTA,  R carotid lesion correlated to ~65% stenosis by NASCET methodology.  I have found CTA to be sometimes too aggressive in diagnosis of lesions, though better than MRA, so if cardiology would consider bridge therapy for a carotid endarterectomy or CAS, I would recommend a R carotid angiogram to determine if he needs a R CEA.  The calcific rim on the carotid and atherosclerotic aortic arch may make CAS a less desirable option.  I usually reserve CEA for sx carotid lesion for the >70% threshold.  This is more conservative than NASCET, but in this LVAD dependent patient, I think this might be a better approach.  This may not be Dr. Evelena Leyden criteria; however, so I would hold off on any significant changes in regard to the anticoagulation until he weighs in.  Will have Dr. Kellie Simmering swing by on Tuesday.  Adele Barthel, MD [facility] [place] Office: [phone] Pager: [phone]  11/06/2015, 9:25 AM

## 2015-11-06 NOTE — Progress Notes (Signed)
VASCULAR LAB PRELIMINARY  PRELIMINARY  PRELIMINARY  PRELIMINARY  Carotid duplex completed.    Preliminary report:  Right: there is significant calcific plaque noted distal CCA/origin ICA.  Velocities are elevated at the site of plaquing, however, secondary to LVAD status, unable to truly grade stenosis.  Left: CEA is patent.  Bilateral vertebral artery flow is antegrade  Ilynn Stauffer, RVT 11/06/2015, 4:21 PM

## 2015-11-06 NOTE — Progress Notes (Signed)
STROKE TEAM PROGRESS NOTE   HISTORY [name] is an 75 y.o. male with a history of CHF with LVAD, coronary artery disease, hypertension, left CEA in TIA, presenting with acute onset of weakness and numbness involving left arm and left leg at 11:30 AM today. Patient is on anticoagulation Coumadin, and INR today was 2.34. CT scan of his head showed no acute intracranial abnormality. He had persistent numbness and mild weakness involving his left arm as well as numbness of the left side of his face and left leg. There were no changes in speech. No facial droop was noted. NIH stroke score was 2.  LSN: 11:30 AM on 11/04/2015 tPA Given: No: INR therapeutic on Coumadin; mild deficits only mRankin:   SUBJECTIVE (INTERVAL HISTORY) No family members present. plavix has been added to his medication regimen. Dr. Bridgett Larsson also saw pt and agree with right CCA/ICA stenosis, but would have pt primary vascular surgeon Dr. Kellie Simmering to evaluate further on Tuesday.    OBJECTIVE Temp:  [97.6 F (36.4 C)-99.3 F (37.4 C)] 98.3 F (36.8 C) (01/01 1100) Pulse Rate:  [69-77] 70 (01/01 1100) Cardiac Rhythm:  [-] Atrial paced (01/01 0802) Resp:  [14-19] 16 (01/01 1100) BP: (99-104)/(81-90) 102/82 mmHg (01/01 0401) SpO2:  [90 %-99 %] 95 % (01/01 1100) Weight:  [186 lb 11.7 oz (84.7 kg)] 186 lb 11.7 oz (84.7 kg) (01/01 0401)  CBC:   Recent Labs Lab 11/05/15 0319 11/06/15 0209  WBC 7.3 8.1  NEUTROABS 5.4 6.0  HGB 10.9* 10.9*  HCT 34.1* 33.4*  MCV 96.1 94.9  PLT 194 [id number]    Basic Metabolic Panel:   Recent Labs Lab 11/05/15 0319 11/06/15 0209  NA 139 138  K 4.6 4.7  CL 105 103  CO2 24 26  GLUCOSE 112* 114*  BUN 16 20  CREATININE 1.28* 1.34*  CALCIUM 8.9 8.9    Lipid Panel:     Component Value Date/Time   CHOL 106 11/05/2015 1138   TRIG 67 11/05/2015 1138   HDL 33* 11/05/2015 1138   CHOLHDL 3.2 11/05/2015 1138   VLDL 13 11/05/2015 1138   LDLCALC 60 11/05/2015 1138   HgbA1c:  Lab  Results  Component Value Date   HGBA1C 5.5 10/18/2013   Urine Drug Screen: No results found for: LABOPIA, COCAINSCRNUR, LABBENZ, AMPHETMU, THCU, LABBARB    IMAGING I have personally reviewed the radiological images below and agree with the radiology interpretations. Blue text is my interpretation.  Ct Head Wo Contrast 11/04/2015   Mild chronic ischemic white matter disease. Moderate diffuse cortical atrophy. No acute intracranial abnormality seen.   CTA Head and Neck  11/05/2015 No flow reducing intracranial or extracranial atherosclerotic lesion. No findings of acute cerebral infarction on the basis of CT/CTA. A small subcortical acute infarct could be difficult to visualize, in the setting of chronic small vessel disease. By my interpretation, pt has right distal CCA close to 70% stenosis, and close to 50% right proximal ICA. In addition, pt has bilateral ICA siphone atherosclerotic changes without flow limiting stenosis.   TTE - Left ventricle: The cavity size was severely dilated. Systolic function was severely reduced. The estimated ejection fraction was in the range of 5-10%. Diffuse hypokinesis. The study is not technically sufficient to allow evaluation of LV diastolic function. - Aortic valve: Aortic valve opens minimally. There was no regurgitation. - Mitral valve: There was no regurgitation. - Left atrium: The atrium was moderately dilated. - Right ventricle: The cavity size was moderately dilated.  Wall thickness was normal. Systolic function was severely reduced. - Right atrium: Pacer wire or catheter noted in right atrium. - Tricuspid valve: There was trivial regurgitation. Impressions: - This was a limited study. LVAD Inflow and outflow cannulas are not visualized. No obvious intracardiac source of embolism was seen.   PHYSICAL EXAM  Temp:  [97.6 F (36.4 C)-99.3 F (37.4 C)] 98.3 F (36.8 C) (01/01 1100) Pulse Rate:  [69-77] 70 (01/01  1100) Resp:  [14-19] 16 (01/01 1100) BP: (99-104)/(81-90) 102/82 mmHg (01/01 0401) SpO2:  [90 %-99 %] 95 % (01/01 1100) Weight:  [186 lb 11.7 oz (84.7 kg)] 186 lb 11.7 oz (84.7 kg) (01/01 0401)  General - Well nourished, well developed, in no apparent distress.  Ophthalmologic - Fundi not visualized due to eye movement.  Cardiovascular - Regular rate and rhythm.  Mental Status -  Level of arousal and orientation to time, place, and person were intact. Language including expression, naming, repetition, comprehension was assessed and found intact. Fund of Knowledge was assessed and was intact.  Cranial Nerves II - XII - II - Visual field intact OU. III, IV, VI - Extraocular movements intact. V - Facial sensation intact bilaterally. VII - Facial movement intact bilaterally. VIII - Hearing & vestibular intact bilaterally. X - Palate elevates symmetrically. XI - Chin turning & shoulder shrug intact bilaterally. XII - Tongue protrusion intact.  Motor Strength - The patient's strength was normal in all extremities except LUE 4/5 with pronator drift and dexterity difficulty.  Bulk was normal and fasciculations were absent.   Motor Tone - Muscle tone was assessed at the neck and appendages and was normal.  Reflexes - The patient's reflexes were 1+ in all extremities and he had no pathological reflexes.  Sensory - Light touch, temperature/pinprick were assessed and were symmetrical.    Coordination - The patient had normal movements in the hands and feet with no ataxia or dysmetria.  Tremor was absent.  Gait and Station - not tested.   ASSESSMENT/PLAN [name] is a 75 y.o. male with history of CHF with LVAD, coronary artery disease with MI, hyperlipidemia, cardiomyopathy,  hypertension, TIA and left CEA in 2008, presenting with weakness and numbness involving left arm and left leg.  He did not receive IV t-PA due to coumadin therapy.  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 Although embolic stroke not able to completely ruled out due to LVAD, cardiomyopathy with EF 5-10%, but his INR therapeutic.   Resultant  improvement in left hemiparesis but not resolved.  CTA head and neck - right CCA ~70% and ICA ~50% stenosis.  2D Echo - EF 5-10%  LDL - 58  HgbA1c - pending  VTE prophylaxis - Coumadin Diet regular Room service appropriate?: Yes; Fluid consistency:: Thin  warfarin daily prior to admission, now on warfarin daily and plavix. Continue coumadin and plavix on discharge.  Patient counseled to be compliant with his antithrombotic medications  Ongoing aggressive stroke risk factor management  Therapy recommendations: Pending  Disposition:  Pending  Carotid stenosis, right  Right CCA ~70% and ICA ~50% on CTA neck  03/2015 CUS showed right ICA 50-69% stenosis   Pt follows with Dr. Kellie Simmering in clinic  Symptomatic this time with left sided symptoms.  Continue coumadin and plavix on discharge  Appreciate Dr. Lianne Moris input. Dr. Kellie Simmering to see on Tuesday  Repeat CUS pending  Cardiomyopathy  EF 5-10%  On LVAD  On coumadin therapeutic INR  INR goal 2-2.5  Hx of GIB  2 jejunal AVMs s/p clipping in 02/2015  No more GIB since  Feels safe to start antiplatelet therapy.  Hypertension  Blood pressure tends to run somewhat low BP goal normotensive.  Hyperlipidemia  Home meds:  Atorvastatin 80 mg daily resumed in hospital  LDL 58, goal < 70  Continue statin at discharge  Other Stroke Risk Factors  Advanced age  Cigarette smoker, quit smoking 14 years ago.  ETOH use  Hx of TIA  Family hx stroke (father)  Coronary artery disease  Obstructive sleep apnea - no CPAP  Other Active Problems  Anemia  Mildly elevated creatinine  Hospital day # 2  Neurology will sign off. Please call with questions. Pt will follow up with Dr. Erlinda Hong at [facility] in about 2 months. Thanks for the  consult.  Rosalin Hawking, MD PhD Stroke Neurology 11/06/2015 1:32 PM   To contact Stroke Continuity provider, please refer to [email or web address] After hours, contact General Neurology

## 2015-11-06 NOTE — Progress Notes (Signed)
[place] for warfarin Indication: LVAD  Allergies  Allergen Reactions  . Demerol [Meperidine] Other (See Comments)    Paralysis. Could only move eyes.    Patient Measurements: Height: 6' (182.9 cm) Weight: 186 lb 11.7 oz (84.7 kg) IBW/kg (Calculated) : 77.6  Vital Signs: Temp: 97.6 F (36.4 C) (01/01 0802) Temp Source: Axillary (01/01 0802) BP: 102/82 mmHg (01/01 0401) Pulse Rate: 69 (01/01 0802)  Labs:  Recent Labs  11/04/15 1310 11/04/15 1319 11/05/15 0319 11/06/15 0209  HGB 10.8* 12.6* 10.9* 10.9*  HCT 33.5* 37.0* 34.1* 33.4*  PLT 183  --  194 194  APTT 44*  --   --   --   LABPROT 25.4*  --  24.9* 24.8*  INR 2.34*  --  2.28* 2.27*  CREATININE 1.55* 1.50* 1.28* 1.34*    Estimated Creatinine Clearance: 51.5 mL/min (by C-G formula based on Cr of 1.34).   Assessment: 75 y/o male with LVAD who presents to the ED with left arm numbness and weakness. Code stroke called and tPA deferred as INR is therapeutic. Neuro suspects acute right subcortical TIA or possible acute small vessel stroke. Pharmacy consulted to manage warfarin inpatient. INR was therapeutic at 2.34 on admit.   INR continues to be at goal today at 2.27 (within lower range). Will continue with home regimen dose tonight. Noted that plavix is being added.  PTA regimen: 2 mg on Tu/Th/Su, 4 mg on M/W/F, 1 mg on Sa per anticoag clinic note; pt reports 2 mg daily except 4 mg MWF, last dose 12/29  Goal of Therapy:  INR 2-2.5 per anticoag clinic note Monitor platelets by anticoagulation protocol: Yes   Plan:  - Warfarin 2 mg PO tonight - INR daily - Monitor for s/sx of bleeding  Erin Hearing PharmD., BCPS Clinical Pharmacist Pager [phone] 11/06/2015 8:50 AM

## 2015-11-06 NOTE — H&P (Signed)
Patient ID:   [id number] Patient: [name]  Date of Birth: 07/10/1941 Visit Type: Office Visit   Date: 09/26/2015 12:00 PM Provider: Marchia Meiers. Vertell Limber MD   This 75 year old male presents for back pain.  History of Present Illness: 1.  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  He states initial injury was February 11, 2015 after which he healed and return to work.  Unfortunately on his first day back at work, June 13, 2015, he fell backwards down a flight of stairs.  Back pain prompted imaging revealing worsening fracture.  Tylenol only as needed  History: Heart pump (LVAD) two years ago, HTN, MI, brainstem stroke 2000, COPD Surgical history: Listed in Epic  Lumbar CT on [place]  I reviewed the patient's CT scan of the lumbar spine which shows a healed L4 compression fracture with necrosis of the superior aspect of the vertebra including the superior endplate.  The patient is currently describing 6 out of 10 pain and says that this is persisting since April.  He says he can no longer do anything.  He has had a LVAD for the last 2 years and has been doing well with this.  He is on Coumadin for chronic anticoagulation for his left ventricular assist device.  I reviewed the patient's imaging studies and explained to him that there would be no option of kyphoplasty procedure given the sclerotic margin and absent superior aspect of the vertebra.  I told him that if he wants to get some relief this would likely require posterior lumbar fusion with implanted hardware without interbody fusion.  The patient feels that he is unable to tolerate his current level of discomfort in the deep does want to proceed with surgery.        PAST MEDICAL/SURGICAL HISTORY   (Detailed)  Disease/disorder Onset Date Management Date Comments  High cholesterol      Hypertension      Thyroid disease         PAST MEDICAL HISTORY, SURGICAL  HISTORY, FAMILY HISTORY, SOCIAL HISTORY AND REVIEW OF SYSTEMS I have reviewed the patient's past medical, surgical, family and social history as well as the comprehensive review of systems as included on the [facility] history form dated 09/26/2015, which I have signed.  Family History  (Detailed) Relationship Family Member Name Deceased Age at Death Condition Onset Age Cause of Death      Family history of Hypertension  N  Father    Stroke  N  Mother    Congestive heart failure  N    SOCIAL HISTORY  (Detailed) Tobacco use reviewed. Preferred language is Unknown.   Smoking status: Former smoker.  SMOKING STATUS Use Status Type Smoking Status Usage Per Day Years Used Total Pack Years  yes  Former smoker       HOME ENVIRONMENT/SAFETY  The Patient has fallen 2 times in the last year.  The fall(s) resulted in injury.  Details: fracture.       MEDICATIONS(added, continued or stopped this visit): Started Medication Directions Instruction Stopped   amlodipine 5 mg tablet take 1 tablet by oral route  every day     atorvastatin 80 mg tablet take 1 tablet by oral route  every day     Colace take 1 capsule by oral route 2 times every day     ICaps Take twice daily     iron take 1 tablet  by oral route 2 times every day     levothyroxine 200 mcg tablet take 1 tablet by oral route  every day     multivitamin Take once daily     pantoprazole 40 mg tablet,delayed release take 1 tablet by oral route 2 times every day     Ranexa 500 mg tablet,extended release take 1 tablet by oral route 2 times every day     warfarin Take 4mg  on Tuesday and Thursday and 2mg  all other days     zinc 50 mg tablet Take twice daily       ALLERGIES: Ingredient Reaction Medication Name Comment  MEPERIDINE HCL  DEMEROL    Reviewed, updated.   REVIEW OF SYSTEMS System Neg/Pos Details  Constitutional Negative Chills, fatigue, fever, malaise, night sweats, weight gain and weight  loss.  ENMT Negative Ear drainage, hearing loss, nasal drainage, otalgia, sinus pressure and sore throat.  Eyes Negative Eye discharge, eye pain and vision changes.  Respiratory Negative Chronic cough, cough, dyspnea, known TB exposure and wheezing.  Cardio Positive LVAD.  GI Negative Abdominal pain, blood in stool, change in stool pattern, constipation, decreased appetite, diarrhea, heartburn, nausea and vomiting.  GU Negative Dribbling, dysuria, erectile dysfunction, hematuria, polyuria, slow stream, urinary frequency, urinary incontinence and urinary retention.  Endocrine Negative Cold intolerance, heat intolerance, polydipsia and polyphagia.  Neuro Negative Dizziness, extremity weakness, gait disturbance, headache, memory impairment, numbness in extremity, seizures and tremors.  Psych Negative Anxiety, depression and insomnia.  Integumentary Negative Brittle hair, brittle nails, change in shape/size of mole(s), hair loss, hirsutism, hives, pruritus, rash and skin lesion.  MS Positive Back pain.  Hema/Lymph Negative Easy bleeding, easy bruising and lymphadenopathy.  Allergic/Immuno Negative Contact allergy, environmental allergies, food allergies and seasonal allergies.  Reproductive Negative Penile discharge and sexual dysfunction.     Vitals Date Temp F BP Pulse Ht In Wt Lb BMI BSA Pain Score  09/26/2015    72 197 26.72  6/10     PHYSICAL EXAM General Level of Distress: no acute distress Overall Appearance: normal    Cardiovascular Cardiac: distant heart sounds appreciated over the atria with a continuous smooth electrical rumble   auscultated most loudly adjacent to the LVAD  Respiratory Lungs: clear to auscultation  Neurological Recent and Remote Memory: normal Attention Span and Concentration:   normal Language: normal Fund of Knowledge: normal  Right Left Sensation: normal normal Upper Extremity Coordination: normal normal  Lower Extremity  Coordination: normal normal  Musculoskeletal Gait and Station: normal  Right Left Upper Extremity Muscle Strength: normal normal Lower Extremity Muscle Strength: normal normal Upper Extremity Muscle Tone:  normal normal Lower Extremity Muscle Tone: normal normal  Motor Strength Upper and lower extremity motor strength was tested in the clinically pertinent muscles.     Deep Tendon Reflexes  Right Left Biceps: normal normal Triceps: normal normal Brachiloradialis: normal normal Patellar: normal normal Achilles: normal normal  Sensory Sensation was tested at L1 to S1.   Cranial Nerves II. Optic Nerve/Visual Fields: normal III. Oculomotor: normal IV. Trochlear: normal V. Trigeminal: normal VI. Abducens: normal VII. Facial: normal VIII. Acoustic/Vestibular: normal IX. Glossopharyngeal: normal X. Vagus: normal XI. Spinal Accessory: normal XII. Hypoglossal: normal  Motor and other Tests Lhermittes: negative Rhomberg: negative    Right Left Hoffman's: normal normal Clonus: normal normal Babinski: normal normal SLR: negative negative Patrick's Corky Sox): negative negative Toe Walk: normal normal Toe Lift: normal normal Heel Walk: normal normal SI Joint: nontender nontender   Additional Findings:  Mechanical low back pain just above the lumbosacral junction with limitations in forward bending and extension secondary to back discomfort.    IMPRESSION Patient is on chronic anticoagulation with left ventricular assist device.  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  Completed Orders (this encounter) Order Details Reason Side Interpretation Result Initial Treatment Date Region  Lifestyle education regarding diet Encouraged to eat a well balanced diet and follow up with primary care physician.         Assessment/Plan # Detail Type Description   1. Assessment Lumbar compression fracture, closed, initial encounter (S32.000A).        2. Assessment Low back pain, unspecified back pain laterality, with sciatica presence unspecified (M54.5).       3. Assessment LVAD (left ventricular assist device) present (Z95.811).       4. Assessment Body mass index (BMI) 26.0-26.9, adult [id number]).   Plan Orders Today's instructions / counseling include(s) Lifestyle education regarding diet.         Pain Assessment/Treatment Pain Scale: 6/10. Method: Numeric Pain Intensity Scale. Location: back. Onset: 02/11/2015. Duration: varies. Quality: discomforting. Pain Assessment/Treatment follow-up plan of care: Patient is taking medications as prescribed..  Fall Risk Plan The Patient has fallen 2 times in the last year. The fall(s) resulted in injury. Details: fracture.  I recommended to patient he undergo posterior fusion L3 through L5 levels with implanted hardware and no interbody grafting.  I called Dr. Haroldine Laws, his cardiologist, to discuss the situation and my recommendations.  He felt that he could admit him and him off Coumadin and switch to heparin and that this would be very reasonable and that he would be a reasonable candidate for surgery.  He also explained that the LVAD coordinator, Jilda Roche, [phone], we'll be able to come to the operating room and assist with positioning to make sure we did not cause any problems with his ventricular assist device.  She would also be available to coordinate perioperative care and anticoagulation issues.  Orders: Diagnostic Procedures: Assessment Procedure  S32.000A Lumbar Fusion - L3 - L4 - L5 (no interbodies)  Instruction(s)/Education: Assessment Instruction  [phone] Lifestyle education regarding diet             Provider:  Marchia Meiers. Vertell Limber MD  10/02/2015 01:40 PM Dictation edited by: Marchia Meiers. Vertell Limber    CC Providers: Shaune Pascal Bensimhon [address] [place], [place] [address]-              Electronically signed by Marchia Meiers Vertell Limber MD on  10/02/2015 01:41 PM

## 2015-11-06 NOTE — Progress Notes (Addendum)
Patient ID: [name], male   DOB: Sep 29, 1941, 75 y.o.   MRN: [id number]  HeartMate 2 Rounding Note  Subjective:    No problems overnight.  Left arm and leg still with some weakness but improved from initial day.    LVAD INTERROGATION:  HeartMate II LVAD:  Flow 3.9 liters/min, speed 9000, power 4.7, PI 8.5.  No PI events.   Objective:    Vital Signs:   Temp:  [97.6 F (36.4 C)-99.3 F (37.4 C)] 97.6 F (36.4 C) (01/01 0802) Pulse Rate:  [69-77] 69 (01/01 0802) Resp:  [14-19] 14 (01/01 0802) BP: (99-110)/(81-92) 102/82 mmHg (01/01 0401) SpO2:  [90 %-99 %] 91 % (01/01 0802) Weight:  [186 lb 11.7 oz (84.7 kg)] 186 lb 11.7 oz (84.7 kg) (01/01 0401) Last BM Date: 11/04/15 Mean arterial Pressure 90  Intake/Output:   Intake/Output Summary (Last 24 hours) at 11/06/15 0810 Last data filed at 11/06/15 0600  Gross per 24 hour  Intake    185 ml  Output    950 ml  Net   -765 ml     Physical Exam: General:  Well appearing. No resp difficulty HEENT: normal Neck: supple. JVP not elevated. Carotids 2+ bilat; no bruits. No lymphadenopathy or thryomegaly appreciated. Cor: Mechanical heart sounds with LVAD hum present. Lungs: clear Abdomen: soft, nontender, nondistended. No hepatosplenomegaly. No bruits or masses. Good bowel sounds. Driveline: C/D/I; securement device intact and driveline incorporated Extremities: no cyanosis, clubbing, rash, edema Neuro: alert & orientedx3, cranial nerves grossly intact. Left arm and leg 4/5 strength. Affect pleasant  Telemetry: NSR 70s  Labs: Basic Metabolic Panel:  Recent Labs Lab 11/04/15 1310 11/04/15 1319 11/05/15 0319 11/06/15 0209  NA 138 139 139 138  K 5.0 5.0 4.6 4.7  CL 105 104 105 103  CO2 23  --  24 26  GLUCOSE 95 92 112* 114*  BUN 19 24* 16 20  CREATININE 1.55* 1.50* 1.28* 1.34*  CALCIUM 8.9  --  8.9 8.9    Liver Function Tests:  Recent Labs Lab 11/04/15 1310  AST 26  ALT 18  ALKPHOS 122  BILITOT 0.8  PROT  6.4*  ALBUMIN 3.5   No results for input(s): LIPASE, AMYLASE in the last 168 hours. No results for input(s): AMMONIA in the last 168 hours.  CBC:  Recent Labs Lab 11/04/15 1310 11/04/15 1319 11/05/15 0319 11/06/15 0209  WBC 6.7  --  7.3 8.1  NEUTROABS 4.6  --  5.4 6.0  HGB 10.8* 12.6* 10.9* 10.9*  HCT 33.5* 37.0* 34.1* 33.4*  MCV 97.1  --  96.1 94.9  PLT 183  --  194 194    INR:  Recent Labs Lab 11/03/15 11/04/15 1310 11/05/15 0319 11/06/15 0209  INR 2.3 2.34* 2.28* 2.27*    Other results:  EKG:   Imaging: Ct Angio Head W/cm &/or Wo Cm  11/05/2015  CLINICAL DATA:  Patient with LVAD presenting with acute onset of weakness and numbness involving the LEFT arm and leg 11/04/2015. Improving weakness. Patient anticoagulated. EXAM: CT ANGIOGRAPHY HEAD AND NECK TECHNIQUE: Multidetector CT imaging of the head and neck was performed using the standard protocol during bolus administration of intravenous contrast. Multiplanar CT image reconstructions and MIPs were obtained to evaluate the vascular anatomy. Carotid stenosis measurements (when applicable) are obtained utilizing NASCET criteria, using the distal internal carotid diameter as the denominator. CONTRAST:  70mL OMNIPAQUE IOHEXOL 350 MG/ML SOLN COMPARISON:  Noncontrast CT head 11/04/2015. FINDINGS: CT HEAD Calvarium and skull base:  No fracture or destructive lesion. Mastoids and middle ears are grossly clear. Paranasal sinuses: Chronic LEFT maxillary sinus disease. Orbits: BILATERAL cataract extraction. Brain: No evidence of acute abnormality, including acute infarct, hemorrhage, hydrocephalus, or mass lesion. Mild atrophy with small vessel disease. CTA NECK Aortic arch: Standard branching. Imaged portion shows no evidence of aneurysm or dissection. No significant stenosis of the major arch vessel origins. Moderate atheromatous calcification. Right carotid system: Heavily calcified distal common carotid artery without flow reducing  lesion. Luminal measurement of 2.8 mm cervical ICA diameter of 4.2 mm, non flow reducing. Additional the included calcific plaque is seen in the proximal RIGHT ICA, again without flow reducing lesion. No soft plaque or ulceration. Dolichoectatic cervical ICA. No dissection. Left carotid system: No evidence of dissection, stenosis (50% or greater) or occlusion. Vertebral arteries: Dominant RIGHT vertebral. Minor ostial calcification without flow reducing lesion. No evidence of dissection, stenosis (50% or greater) or occlusion. Nonvascular soft tissues: Severe COPD. BILATERAL pleural effusions incompletely evaluated. Cervical spondylosis. CTA HEAD Anterior circulation: Minor calcific atheromatous change in the cavernous carotids. Mild irregularity of the M3 branches bilaterally without visible occlusion. No significant stenosis, proximal occlusion, aneurysm, or vascular malformation. Posterior circulation: RIGHT vertebral dominant. No significant stenosis, proximal occlusion, aneurysm, or vascular malformation. Venous sinuses: As permitted by contrast timing, patent. Anatomic variants: None of significance. Delayed phase:   No abnormal intracranial enhancement. IMPRESSION: No flow reducing intracranial or extracranial atherosclerotic lesion. No findings of acute cerebral infarction on the basis of CT/CTA. A small subcortical acute infarct could be difficult to visualize, in the setting of chronic small vessel disease. Severe COPD. Electronically Signed   By: Staci Righter M.D.   On: 11/05/2015 13:42   Ct Head Wo Contrast  11/04/2015  CLINICAL DATA:  Left arm and left leg weakness. EXAM: CT HEAD WITHOUT CONTRAST TECHNIQUE: Contiguous axial images were obtained from the base of the skull through the vertex without intravenous contrast. COMPARISON:  CT scan of October 15, 2013. FINDINGS: Bony calvarium appears intact. Moderate diffuse cortical atrophy is noted. Mild chronic ischemic white matter disease is noted.  No mass effect or midline shift is noted. Ventricular size is within normal limits. There is no evidence of mass lesion, hemorrhage or acute infarction. IMPRESSION: Mild chronic ischemic white matter disease. Moderate diffuse cortical atrophy. No acute intracranial abnormality seen. These results were called by telephone at the time of interpretation on 11/04/2015 at 1:28 pm to Dr. Nicole Kindred, who verbally acknowledged these results. Electronically Signed   By: Marijo Conception, M.D.   On: 11/04/2015 13:30   Ct Angio Neck W/cm &/or Wo/cm  11/05/2015  CLINICAL DATA:  Patient with LVAD presenting with acute onset of weakness and numbness involving the LEFT arm and leg 11/04/2015. Improving weakness. Patient anticoagulated. EXAM: CT ANGIOGRAPHY HEAD AND NECK TECHNIQUE: Multidetector CT imaging of the head and neck was performed using the standard protocol during bolus administration of intravenous contrast. Multiplanar CT image reconstructions and MIPs were obtained to evaluate the vascular anatomy. Carotid stenosis measurements (when applicable) are obtained utilizing NASCET criteria, using the distal internal carotid diameter as the denominator. CONTRAST:  77mL OMNIPAQUE IOHEXOL 350 MG/ML SOLN COMPARISON:  Noncontrast CT head 11/04/2015. FINDINGS: CT HEAD Calvarium and skull base: No fracture or destructive lesion. Mastoids and middle ears are grossly clear. Paranasal sinuses: Chronic LEFT maxillary sinus disease. Orbits: BILATERAL cataract extraction. Brain: No evidence of acute abnormality, including acute infarct, hemorrhage, hydrocephalus, or mass lesion. Mild atrophy with small vessel disease.  CTA NECK Aortic arch: Standard branching. Imaged portion shows no evidence of aneurysm or dissection. No significant stenosis of the major arch vessel origins. Moderate atheromatous calcification. Right carotid system: Heavily calcified distal common carotid artery without flow reducing lesion. Luminal measurement of 2.8  mm cervical ICA diameter of 4.2 mm, non flow reducing. Additional the included calcific plaque is seen in the proximal RIGHT ICA, again without flow reducing lesion. No soft plaque or ulceration. Dolichoectatic cervical ICA. No dissection. Left carotid system: No evidence of dissection, stenosis (50% or greater) or occlusion. Vertebral arteries: Dominant RIGHT vertebral. Minor ostial calcification without flow reducing lesion. No evidence of dissection, stenosis (50% or greater) or occlusion. Nonvascular soft tissues: Severe COPD. BILATERAL pleural effusions incompletely evaluated. Cervical spondylosis. CTA HEAD Anterior circulation: Minor calcific atheromatous change in the cavernous carotids. Mild irregularity of the M3 branches bilaterally without visible occlusion. No significant stenosis, proximal occlusion, aneurysm, or vascular malformation. Posterior circulation: RIGHT vertebral dominant. No significant stenosis, proximal occlusion, aneurysm, or vascular malformation. Venous sinuses: As permitted by contrast timing, patent. Anatomic variants: None of significance. Delayed phase:   No abnormal intracranial enhancement. IMPRESSION: No flow reducing intracranial or extracranial atherosclerotic lesion. No findings of acute cerebral infarction on the basis of CT/CTA. A small subcortical acute infarct could be difficult to visualize, in the setting of chronic small vessel disease. Severe COPD. Electronically Signed   By: Staci Righter M.D.   On: 11/05/2015 13:42     Medications:     Scheduled Medications: . amLODipine  5 mg Oral Daily  . atorvastatin  80 mg Oral Daily  . clopidogrel  75 mg Oral Daily  . docusate sodium  200 mg Oral Daily  . ferrous gluconate  324 mg Oral BID WC  . [START ON 11/07/2015] levothyroxine  100 mcg Oral Q Mon  . levothyroxine  200 mcg Oral QAC breakfast  . multivitamin with minerals  1 tablet Oral QHS  . pantoprazole  40 mg Oral BID  . ranolazine  500 mg Oral BID  .  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 Presented to [facility] with left sided weakness. Has had 2x subtherapeutic INRs in the last 1 month.   1. CVA: Suspected with left-sided weakness.  Improved from initial presentation but still present.  Initial CT negative for acute abnormality, CTA head/neck yesterday did not visualize definite CVA.  Per radiology read of CTA, "no flow-limiting stenosis" in the right CCA or ICA, however neurology thinks 70% right CCA and 50% right ICA as possible etiology for embolic CVA.  Vascular surgery has seen, pending formal consult. Echo stable.  - Will need discussion with neurology and vascular regarding the right carotid system => how significant is the disease and how high risk of recurrence? Right CEA down the road would not be out of the question using bridging therapy off coumadin.    - Mobilize with PT/OT.  - He has been on coumadin with goal INR 2-2.5, currently therapeutic (will continue).  History of GI bleeding with discontinuation of aspirin.  Plavix begun today. 2. Chronic systolic HF: s/p LVAD [id number]. Volume status stable. Can get po lasix as needed.  3.Atrial arrhythmias: Atrial fibrillation, atrial tachycardia. S/p DC-CV 06/04/14 and repeat DC-CV 03/02/15.Interrogation of device revealed atrial fibrillation briefly on 11/22 and 12/1.  He is in NSR currently and INR therapeutic. Ranexa to help decrease risk of recurrence.  4. HTN: MAP a bit high currently, 90 today.  In setting of acute CVA, will not add medication to lower.  If it remains up tomorrow, will need to increase amlodipine to 10 mg daily.  5. LVAD: LVAD parameters reviewed personally, stable.   No evidence for large pump thrombus => LDH stable, LVAD parameters stable.  6. CKD, stage III: Creatinine stable. 7. Anticoagulation management: Goal INR 2-2.5. He is off ASA with GI bleeding. Starting Plavix.  8. 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 Watch closely with Plavix addition. 9. Lumbar compression fracture: Had surgery scheduled for 11/29/15. This will likely be further delayed. 10. Disposition: Mobilize today, possibly home tomorrow.    I reviewed the LVAD parameters from today, and compared the results to the patient's prior recorded data.  No programming changes were made.  The LVAD is functioning within specified parameters.  The patient performs LVAD self-test daily.  LVAD interrogation was negative for any significant power changes, alarms or PI events/speed drops.  LVAD equipment check completed and is in good working order.  Back-up equipment present.   LVAD education done on emergency procedures and precautions and reviewed exit site care.  Length of Stay: 2  Loralie Champagne 11/06/2015, 8:10 AM  VAD Team --- VAD ISSUES ONLY--- Pager [phone] (7am - 7am)  Advanced Heart Failure Team  Pager [phone] (M-F; 7a - 4p)  Please contact [facility] for night-coverage after hours (4p -7a ) and weekends on [email or web address]

## 2015-11-07 ENCOUNTER — Encounter (HOSPITAL_COMMUNITY): Payer: Self-pay | Admitting: Physician Assistant

## 2015-11-07 DIAGNOSIS — I638 Other cerebral infarction: Secondary | ICD-10-CM

## 2015-11-07 DIAGNOSIS — I480 Paroxysmal atrial fibrillation: Secondary | ICD-10-CM | POA: Diagnosis present

## 2015-11-07 DIAGNOSIS — E039 Hypothyroidism, unspecified: Secondary | ICD-10-CM | POA: Diagnosis present

## 2015-11-07 DIAGNOSIS — Z9889 Other specified postprocedural states: Secondary | ICD-10-CM

## 2015-11-07 DIAGNOSIS — Z8719 Personal history of other diseases of the digestive system: Secondary | ICD-10-CM

## 2015-11-07 DIAGNOSIS — N189 Chronic kidney disease, unspecified: Secondary | ICD-10-CM | POA: Diagnosis present

## 2015-11-07 LAB — CBC WITH DIFFERENTIAL/PLATELET
Basophils Absolute: 0 10*3/uL (ref 0.0–0.1)
Basophils Relative: 1 %
EOS PCT: 4 %
Eosinophils Absolute: 0.3 10*3/uL (ref 0.0–0.7)
HEMATOCRIT: 33.6 % — AB (ref 39.0–52.0)
HEMOGLOBIN: 10.8 g/dL — AB (ref 13.0–17.0)
LYMPHS ABS: 0.9 10*3/uL (ref 0.7–4.0)
LYMPHS PCT: 12 %
MCH: 30.4 pg (ref 26.0–34.0)
MCHC: 32.1 g/dL (ref 30.0–36.0)
MCV: 94.6 fL (ref 78.0–100.0)
Monocytes Absolute: 0.9 10*3/uL (ref 0.1–1.0)
Monocytes Relative: 13 %
NEUTROS ABS: 5.2 10*3/uL (ref 1.7–7.7)
Neutrophils Relative %: 70 %
PLATELETS: 193 10*3/uL (ref 150–400)
RBC: 3.55 MIL/uL — AB (ref 4.22–5.81)
RDW: 15.5 % (ref 11.5–15.5)
WBC: 7.3 10*3/uL (ref 4.0–10.5)

## 2015-11-07 LAB — PROTIME-INR
INR: 2.13 — ABNORMAL HIGH (ref 0.00–1.49)
Prothrombin Time: 23.7 seconds — ABNORMAL HIGH (ref 11.6–15.2)

## 2015-11-07 LAB — BASIC METABOLIC PANEL
ANION GAP: 9 (ref 5–15)
BUN: 20 mg/dL (ref 6–20)
CHLORIDE: 104 mmol/L (ref 101–111)
CO2: 25 mmol/L (ref 22–32)
Calcium: 8.9 mg/dL (ref 8.9–10.3)
Creatinine, Ser: 1.22 mg/dL (ref 0.61–1.24)
GFR calc Af Amer: >60 mL/min (ref >60)
GFR calc non Af Amer: 56 mL/min — ABNORMAL LOW (ref >60)
Glucose, Bld: 106 mg/dL — ABNORMAL HIGH (ref 65–99)
POTASSIUM: 4.2 mmol/L (ref 3.5–5.1)
SODIUM: 138 mmol/L (ref 135–145)

## 2015-11-07 LAB — LACTATE DEHYDROGENASE: LDH: 223 U/L — ABNORMAL HIGH (ref 98–192)

## 2015-11-07 MED ORDER — CLOPIDOGREL BISULFATE 75 MG PO TABS: 75.0000 mg | ORAL_TABLET | Freq: Every day | ORAL | Status: DC

## 2015-11-07 MED ORDER — AMLODIPINE BESYLATE 10 MG PO TABS: 10.0000 mg | ORAL_TABLET | Freq: Every day | ORAL | Status: DC

## 2015-11-07 MED ORDER — WARFARIN SODIUM 2 MG PO TABS: 4.0000 mg | ORAL_TABLET | Freq: Once | ORAL | Status: DC

## 2015-11-07 MED ORDER — AMLODIPINE BESYLATE 10 MG PO TABS
10.0000 mg | ORAL_TABLET | Freq: Every day | ORAL | Status: DC
  Administered 2015-11-07: 10 mg via ORAL
  Filled 2015-11-07: qty 1

## 2015-11-07 NOTE — Progress Notes (Signed)
PT Cancellation Note  Patient Details Name: LAYTHEN MCKINNY MRN: [id number] DOB: 1941/06/05   Cancelled Treatment:    Reason Eval/Treat Not Completed: Other (comment) (Pt already discharged prior to PT arrival.  )   Denice Paradise 11/07/2015, 1:36 PM [place] Maximo Spratling,PT Acute Rehabilitation [phone] (pager)

## 2015-11-07 NOTE — Discharge Summary (Signed)
Discharge Summary   Patient ID: [name] MRN: [id number], DOB/AGE: Apr 24, 1941 75 y.o. Admit date: 11/04/2015 D/C date:     11/07/2015  Primary Cardiologist:  Dr. Aundra Dubin  Principal Problem:   CVA (cerebral infarction) Active Problems:   LVAD (left ventricular assist device) present ([place])   HTN (hypertension)   HLD (hyperlipidemia)   CAD (coronary artery disease)   Cardiomyopathy, ischemic   Carotid stenosis   History of CEA (carotid endarterectomy)   Chronic anticoagulation   History of GI bleed   PAF (paroxysmal atrial fibrillation) ([place])   CKD (chronic kidney disease)   Hypothyroidism    Admission Dates: 11/04/15-11/07/15 Discharge Diagnosis: CVA in the setting of subtheraputic INRs  HPI: [name] is a 75 y.o. 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   Noted ~1130 (11/04/15) while eating lunch. He said his arm just felt "dead" Denied headache, visual changes, or facial droop. He complained of paraesthesias/numbness in left arm, left side of face, and leg.  In the ED his INR was 2.34 but he had 2x subtherapeutic INRs in the last 1 month (1.6).  He was evaluated by Dr Nicole Kindred of Neurology and NIH stroke score 2 and symptomology did not warrant IR intervention. It was felt to be a TIA vs small CVA. He was admitted for observation over the weekend and further evaluation.    Hospital Course  1. CVA: Suspected with left-sided weakness. Improved from initial presentation but still present. Initial CT negative for acute abnormality, CTA head/neck repeated did not visualize definite CVA. Per radiology read of CTA, "no flow-limiting stenosis" in the right CCA or ICA, however neurology/vascular surgery think 65% right CCA and 50% right ICA stenosis. This remains potential source for  CVA. Echo stable.  - Vascular suggests carotid angiogram to more closely assess degree of stenosis in right carotid system. Would like to see him out a bit from the acute event before holding anticoagulation (with bridging). Will have him followup with Dr. Kellie Simmering soon as outpatient.  - No problems walking.  - He has been on coumadin with goal INR 2-2.5, currently therapeutic (will continue). History of GI bleeding with discontinuation of aspirin. Plavix begun 11/06/15. 2. Chronic systolic HF: s/p LVAD [id number]. Volume status stable. Can get po lasix as needed. 3.Atrial arrhythmias: Atrial fibrillation, atrial tachycardia. S/p DC-CV 06/04/14 and repeat DC-CV 03/02/15.Interrogation of device revealed atrial fibrillation briefly on 11/22 and 12/1. He is in NSR currently and INR therapeutic. Ranexa to help decrease risk of recurrence.  4. HTN: MAP remains in 90s. Initially did not increase meds as in setting of acute CVA. Amlodipine increased to 10 mg daily on discharge.   5. LVAD: LVAD parameters reviewed by Dr Aundra Dubin and are stable. No evidence for large pump thrombus => LDH stable, LVAD parameters stable.  6. CKD, stage III: Creatinine stable at 1.22 today. 7. Anticoagulation management: Goal INR 2-2.5. He is off ASA with hx of GI bleeding. Started Plavix.  8. 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 Watch closely with Plavix addition. 9. Lumbar compression fracture: Had surgery scheduled for 11/29/15. This will likely be further delayed.  10. Disposition: discharged on 11/07/15. Will need LVAD clinic followup in 1 week with INR. Needs followup as soon as possible with Dr Kellie Simmering  at [facility].   Meds: Plavix 75 mg daily, warfarin goal INR 2-2.5, amlodipine 10 mg daily, atorvastatin 80 mg daily, Ranexa 500 bid, protonix 40 daily, other meds as prior to admission.   Dr. Aundra Dubin reviewed the LVAD parameters from today, and compared the results to the patient's prior recorded data. No  programming changes were made. The LVAD is functioning within specified parameters. The patient performs LVAD self-test daily. LVAD interrogation was negative for any significant power changes, alarms or PI events/speed drops. LVAD equipment check completed and is in good working order. Back-up equipment present. LVAD education done on emergency procedures and precautions and reviewed exit site care.   The patient has had an uncomplicated hospital course and is recovering well. He has been seen by Dr. Aundra Dubin today and deemed ready for discharge home. Staff messages for all follow-up appointments have been sent. Discharge medications are listed below.   Discharge Vitals: Blood pressure 102/82, pulse 70, temperature 98 F (36.7 C), temperature source Oral, resp. rate 14, height 6' (1.829 m), weight 186 lb 9.6 oz (84.641 kg), SpO2 90 %.  Labs: Lab Results  Component Value Date   WBC 7.3 11/07/2015   HGB 10.8* 11/07/2015   HCT 33.6* 11/07/2015   MCV 94.6 11/07/2015   PLT 193 11/07/2015    Recent Labs Lab 11/04/15 1310  11/07/15 0224  NA 138  < > 138  K 5.0  < > 4.2  CL 105  < > 104  CO2 23  < > 25  BUN 19  < > 20  CREATININE 1.55*  < > 1.22  CALCIUM 8.9  < > 8.9  PROT 6.4*  --   --   BILITOT 0.8  --   --   ALKPHOS 122  --   --   ALT 18  --   --   AST 26  --   --   GLUCOSE 95  < > 106*  < > = values in this interval not displayed. No results for input(s): CKTOTAL, CKMB, TROPONINI in the last 72 hours. Lab Results  Component Value Date   CHOL 106 11/05/2015   HDL 33* 11/05/2015   LDLCALC 60 11/05/2015   TRIG 67 11/05/2015   Lab Results  Component Value Date   DDIMER 2.63* 10/20/2013    Diagnostic Studies/Procedures   Ct Angio Head W/cm &/or Wo Cm  11/05/2015  CLINICAL DATA:  Patient with LVAD presenting with acute onset of weakness and numbness involving the LEFT arm and leg 11/04/2015. Improving weakness. Patient anticoagulated. EXAM: CT ANGIOGRAPHY HEAD AND  NECK TECHNIQUE: Multidetector CT imaging of the head and neck was performed using the standard protocol during bolus administration of intravenous contrast. Multiplanar CT image reconstructions and MIPs were obtained to evaluate the vascular anatomy. Carotid stenosis measurements (when applicable) are obtained utilizing NASCET criteria, using the distal internal carotid diameter as the denominator. CONTRAST:  17mL OMNIPAQUE IOHEXOL 350 MG/ML SOLN COMPARISON:  Noncontrast CT head 11/04/2015. FINDINGS: CT HEAD Calvarium and skull base: No fracture or destructive lesion. Mastoids and middle ears are grossly clear. Paranasal sinuses: Chronic LEFT maxillary sinus disease. Orbits: BILATERAL cataract extraction. Brain: No evidence of acute abnormality, including acute infarct, hemorrhage, hydrocephalus, or mass lesion. Mild atrophy with small vessel disease. CTA NECK Aortic arch: Standard branching. Imaged portion shows no evidence of aneurysm or dissection. No significant stenosis of the major arch vessel origins. Moderate atheromatous calcification. Right carotid system: Heavily calcified distal common carotid artery without flow reducing lesion.  Luminal measurement of 2.8 mm cervical ICA diameter of 4.2 mm, non flow reducing. Additional the included calcific plaque is seen in the proximal RIGHT ICA, again without flow reducing lesion. No soft plaque or ulceration. Dolichoectatic cervical ICA. No dissection. Left carotid system: No evidence of dissection, stenosis (50% or greater) or occlusion. Vertebral arteries: Dominant RIGHT vertebral. Minor ostial calcification without flow reducing lesion. No evidence of dissection, stenosis (50% or greater) or occlusion. Nonvascular soft tissues: Severe COPD. BILATERAL pleural effusions incompletely evaluated. Cervical spondylosis. CTA HEAD Anterior circulation: Minor calcific atheromatous change in the cavernous carotids. Mild irregularity of the M3 branches bilaterally without  visible occlusion. No significant stenosis, proximal occlusion, aneurysm, or vascular malformation. Posterior circulation: RIGHT vertebral dominant. No significant stenosis, proximal occlusion, aneurysm, or vascular malformation. Venous sinuses: As permitted by contrast timing, patent. Anatomic variants: None of significance. Delayed phase:   No abnormal intracranial enhancement. IMPRESSION: No flow reducing intracranial or extracranial atherosclerotic lesion. No findings of acute cerebral infarction on the basis of CT/CTA. A small subcortical acute infarct could be difficult to visualize, in the setting of chronic small vessel disease. Severe COPD. Electronically Signed   By: Staci Righter M.D.   On: 11/05/2015 13:42   Ct Head Wo Contrast  11/04/2015  CLINICAL DATA:  Left arm and left leg weakness. EXAM: CT HEAD WITHOUT CONTRAST TECHNIQUE: Contiguous axial images were obtained from the base of the skull through the vertex without intravenous contrast. COMPARISON:  CT scan of October 15, 2013. FINDINGS: Bony calvarium appears intact. Moderate diffuse cortical atrophy is noted. Mild chronic ischemic white matter disease is noted. No mass effect or midline shift is noted. Ventricular size is within normal limits. There is no evidence of mass lesion, hemorrhage or acute infarction. IMPRESSION: Mild chronic ischemic white matter disease. Moderate diffuse cortical atrophy. No acute intracranial abnormality seen. These results were called by telephone at the time of interpretation on 11/04/2015 at 1:28 pm to Dr. Nicole Kindred, who verbally acknowledged these results. Electronically Signed   By: Marijo Conception, M.D.   On: 11/04/2015 13:30   Ct Angio Neck W/cm &/or Wo/cm  11/05/2015  CLINICAL DATA:  Patient with LVAD presenting with acute onset of weakness and numbness involving the LEFT arm and leg 11/04/2015. Improving weakness. Patient anticoagulated. EXAM: CT ANGIOGRAPHY HEAD AND NECK TECHNIQUE: Multidetector CT  imaging of the head and neck was performed using the standard protocol during bolus administration of intravenous contrast. Multiplanar CT image reconstructions and MIPs were obtained to evaluate the vascular anatomy. Carotid stenosis measurements (when applicable) are obtained utilizing NASCET criteria, using the distal internal carotid diameter as the denominator. CONTRAST:  83mL OMNIPAQUE IOHEXOL 350 MG/ML SOLN COMPARISON:  Noncontrast CT head 11/04/2015. FINDINGS: CT HEAD Calvarium and skull base: No fracture or destructive lesion. Mastoids and middle ears are grossly clear. Paranasal sinuses: Chronic LEFT maxillary sinus disease. Orbits: BILATERAL cataract extraction. Brain: No evidence of acute abnormality, including acute infarct, hemorrhage, hydrocephalus, or mass lesion. Mild atrophy with small vessel disease. CTA NECK Aortic arch: Standard branching. Imaged portion shows no evidence of aneurysm or dissection. No significant stenosis of the major arch vessel origins. Moderate atheromatous calcification. Right carotid system: Heavily calcified distal common carotid artery without flow reducing lesion. Luminal measurement of 2.8 mm cervical ICA diameter of 4.2 mm, non flow reducing. Additional the included calcific plaque is seen in the proximal RIGHT ICA, again without flow reducing lesion. No soft plaque or ulceration. Dolichoectatic cervical ICA. No dissection. Left carotid  system: No evidence of dissection, stenosis (50% or greater) or occlusion. Vertebral arteries: Dominant RIGHT vertebral. Minor ostial calcification without flow reducing lesion. No evidence of dissection, stenosis (50% or greater) or occlusion. Nonvascular soft tissues: Severe COPD. BILATERAL pleural effusions incompletely evaluated. Cervical spondylosis. CTA HEAD Anterior circulation: Minor calcific atheromatous change in the cavernous carotids. Mild irregularity of the M3 branches bilaterally without visible occlusion. No significant  stenosis, proximal occlusion, aneurysm, or vascular malformation. Posterior circulation: RIGHT vertebral dominant. No significant stenosis, proximal occlusion, aneurysm, or vascular malformation. Venous sinuses: As permitted by contrast timing, patent. Anatomic variants: None of significance. Delayed phase:   No abnormal intracranial enhancement. IMPRESSION: No flow reducing intracranial or extracranial atherosclerotic lesion. No findings of acute cerebral infarction on the basis of CT/CTA. A small subcortical acute infarct could be difficult to visualize, in the setting of chronic small vessel disease. Severe COPD. Electronically Signed   By: Staci Righter M.D.   On: 11/05/2015 13:42    2D ECHO: 11/05/2015 LV EF: 20% -  25% Study Conclusions - Left ventricle: The cavity size was severely dilated. Systolic function was severely reduced. The estimated ejection fraction was in the range of 5-10%. Diffuse hypokinesis. The study is not technically sufficient to allow evaluation of LV diastolic function. - Aortic valve: Aortic valve opens minimally. There was no regurgitation. - Mitral valve: There was no regurgitation. - Left atrium: The atrium was moderately dilated. - Right ventricle: The cavity size was moderately dilated. Wall thickness was normal. Systolic function was severely reduced. - Right atrium: Pacer wire or catheter noted in right atrium. - Tricuspid valve: There was trivial regurgitation. Impressions: - This was a limited study. LVAD Inflow and outflow cannulas are not visualized. No obvious intracardiac source of embolism was seen.   Discharge Medications     Medication List    TAKE these medications        acetaminophen 325 MG tablet  Commonly known as:  TYLENOL  Take 650 mg by mouth every 6 (six) hours as needed (pain).     amLODipine 10 MG tablet  Commonly known as:  NORVASC  Take 1 tablet (10 mg total) by mouth at bedtime.     atorvastatin 80  MG tablet  Commonly known as:  LIPITOR  Take 80 mg by mouth daily.     clopidogrel 75 MG tablet  Commonly known as:  PLAVIX  Take 1 tablet (75 mg total) by mouth daily.     docusate sodium 100 MG capsule  Commonly known as:  COLACE  Take 200 mg by mouth daily.     ferrous gluconate 324 MG tablet  Commonly known as:  FERGON  Take 1 tablet (324 mg total) by mouth 2 (two) times daily with a meal.     furosemide 20 MG tablet  Commonly known as:  LASIX  Take 1 tablet (20 mg total) by mouth daily.     levothyroxine 200 MCG tablet  Commonly known as:  SYNTHROID, LEVOTHROID  Take one 200 mcg daily before breakfast every day with 1 1/2 (300 mcg) every Monday.     multivitamin with minerals Tabs tablet  Take 1 tablet by mouth at bedtime.     pantoprazole 40 MG tablet  Commonly known as:  PROTONIX  Take 1 tablet (40 mg total) by mouth 2 (two) times daily.     RANEXA 500 MG 12 hr tablet  Generic drug:  ranolazine  TAKE 1 TABLET (500 MG TOTAL) BY MOUTH 2 (TWO)  TIMES DAILY.     warfarin 2 MG tablet  Commonly known as:  COUMADIN  Take 2-4 mg by mouth daily. Take 4 mg by mouth Monday, Wednesday, and Friday. Take 2 mg by mouth all other days.     zinc gluconate 50 MG tablet  Take 100 mg by mouth at bedtime.        Disposition   The patient will be discharged in stable condition to home. Discharge Instructions    Ambulatory referral to Neurology    Complete by:  As directed   Pt will follow up with Dr. Erlinda Hong at [facility] in about 2 months. Thanks.          Follow-up Information    Follow up with Xu,Jindong, MD. Schedule an appointment as soon as possible for a visit in 2 months.   Specialty:  Neurology   Why:  stroke clinic   Contact information:   [address] [place] [id number] [phone]       Follow up with [facility].   Specialty:  Cardiology   Why:  The office will call you to make an appoinment., If you do  not hear from them, please contact them., You should be seen withiin 1 week for an INR check and LVAD clinic    Contact information:   [address] [id number] [place] [phone]      Follow up with Tinnie Gens, MD.   Specialties:  Vascular Surgery, Interventional Cardiology, Cardiology   Why:  The office will call you to make an appoinment., If you do not hear from them, please contact them., You should be seen within 1-2 weeks.   Contact information:   [address] [place] [address] [phone]         Duration of Discharge Encounter: Greater than 30 minutes including physician and PA time.  Mable Fill R PA-C 11/07/2015, 9:29 AM

## 2015-11-07 NOTE — Progress Notes (Signed)
Patient ID: [name], male   DOB: [birth year], 75 y.o.   MRN: [id number]  HeartMate 2 Rounding Note  Subjective:    No problems overnight.  Left arm and leg still with some weakness but improved from initial day.  MAP remains elevated in 90s.   LVAD INTERROGATION:  HeartMate II LVAD:  Flow 3.9 liters/min, speed 9000, power 4.6, PI 8.2.  7 PI events yesterday around 10:30 am (he does not remember what he was doing, thinks daughter was in the room). No PI events since then.    Objective:    Vital Signs:   Temp:  [98 F (36.7 C)-99.6 F (37.6 C)] 98 F (36.7 C) (01/02 0746) Pulse Rate:  [66-78] 66 (01/02 0746) Resp:  [14-19] 18 (01/02 0746) SpO2:  [86 %-96 %] 90 % (01/02 0746) Weight:  [186 lb 9.6 oz (84.641 kg)] 186 lb 9.6 oz (84.641 kg) (01/02 0423) Last BM Date: 11/04/15 Mean arterial Pressure 90  Intake/Output:   Intake/Output Summary (Last 24 hours) at 11/07/15 0829 Last data filed at 11/07/15 0400  Gross per 24 hour  Intake    270 ml  Output    850 ml  Net   -580 ml     Physical Exam: General:  Well appearing. No resp difficulty HEENT: normal Neck: supple. JVP not elevated. Carotids 2+ bilat; no bruits. No lymphadenopathy or thryomegaly appreciated. Cor: Mechanical heart sounds with LVAD hum present. Lungs: clear Abdomen: soft, nontender, nondistended. No hepatosplenomegaly. No bruits or masses. Good bowel sounds. Driveline: C/D/I; securement device intact and driveline incorporated Extremities: no cyanosis, clubbing, rash, edema Neuro: alert & orientedx3, cranial nerves grossly intact. Left arm and leg 4/5 strength. Affect pleasant  Telemetry: a-paced 70s  Labs: Basic Metabolic Panel:  Recent Labs Lab 11/04/15 1310 11/04/15 1319 11/05/15 0319 11/06/15 0209 11/07/15 0224  NA 138 139 139 138 138  K 5.0 5.0 4.6 4.7 4.2  CL 105 104 105 103 104  CO2 23  --  24 26 25   GLUCOSE 95 92 112* 114* 106*  BUN 19 24* 16 20 20   CREATININE 1.55* 1.50* 1.28*  1.34* 1.22  CALCIUM 8.9  --  8.9 8.9 8.9    Liver Function Tests:  Recent Labs Lab 11/04/15 1310  AST 26  ALT 18  ALKPHOS 122  BILITOT 0.8  PROT 6.4*  ALBUMIN 3.5   No results for input(s): LIPASE, AMYLASE in the last 168 hours. No results for input(s): AMMONIA in the last 168 hours.  CBC:  Recent Labs Lab 11/04/15 1310 11/04/15 1319 11/05/15 0319 11/06/15 0209 11/07/15 0224  WBC 6.7  --  7.3 8.1 7.3  NEUTROABS 4.6  --  5.4 6.0 5.2  HGB 10.8* 12.6* 10.9* 10.9* 10.8*  HCT 33.5* 37.0* 34.1* 33.4* 33.6*  MCV 97.1  --  96.1 94.9 94.6  PLT 183  --  194 194 193    INR:  Recent Labs Lab 11/03/15 11/04/15 1310 11/05/15 0319 11/06/15 0209 11/07/15 0224  INR 2.3 2.34* 2.28* 2.27* 2.13*    Other results:  EKG:   Imaging: Ct Angio Head W/cm &/or Wo Cm  11/05/2015  CLINICAL DATA:  Patient with LVAD presenting with acute onset of weakness and numbness involving the LEFT arm and leg 11/04/2015. Improving weakness. Patient anticoagulated. EXAM: CT ANGIOGRAPHY HEAD AND NECK TECHNIQUE: Multidetector CT imaging of the head and neck was performed using the standard protocol during bolus administration of intravenous contrast. Multiplanar CT image reconstructions and MIPs were obtained to  evaluate the vascular anatomy. Carotid stenosis measurements (when applicable) are obtained utilizing NASCET criteria, using the distal internal carotid diameter as the denominator. CONTRAST:  29mL OMNIPAQUE IOHEXOL 350 MG/ML SOLN COMPARISON:  Noncontrast CT head 11/04/2015. FINDINGS: CT HEAD Calvarium and skull base: No fracture or destructive lesion. Mastoids and middle ears are grossly clear. Paranasal sinuses: Chronic LEFT maxillary sinus disease. Orbits: BILATERAL cataract extraction. Brain: No evidence of acute abnormality, including acute infarct, hemorrhage, hydrocephalus, or mass lesion. Mild atrophy with small vessel disease. CTA NECK Aortic arch: Standard branching. Imaged portion shows  no evidence of aneurysm or dissection. No significant stenosis of the major arch vessel origins. Moderate atheromatous calcification. Right carotid system: Heavily calcified distal common carotid artery without flow reducing lesion. Luminal measurement of 2.8 mm cervical ICA diameter of 4.2 mm, non flow reducing. Additional the included calcific plaque is seen in the proximal RIGHT ICA, again without flow reducing lesion. No soft plaque or ulceration. Dolichoectatic cervical ICA. No dissection. Left carotid system: No evidence of dissection, stenosis (50% or greater) or occlusion. Vertebral arteries: Dominant RIGHT vertebral. Minor ostial calcification without flow reducing lesion. No evidence of dissection, stenosis (50% or greater) or occlusion. Nonvascular soft tissues: Severe COPD. BILATERAL pleural effusions incompletely evaluated. Cervical spondylosis. CTA HEAD Anterior circulation: Minor calcific atheromatous change in the cavernous carotids. Mild irregularity of the M3 branches bilaterally without visible occlusion. No significant stenosis, proximal occlusion, aneurysm, or vascular malformation. Posterior circulation: RIGHT vertebral dominant. No significant stenosis, proximal occlusion, aneurysm, or vascular malformation. Venous sinuses: As permitted by contrast timing, patent. Anatomic variants: None of significance. Delayed phase:   No abnormal intracranial enhancement. IMPRESSION: No flow reducing intracranial or extracranial atherosclerotic lesion. No findings of acute cerebral infarction on the basis of CT/CTA. A small subcortical acute infarct could be difficult to visualize, in the setting of chronic small vessel disease. Severe COPD. Electronically Signed   By: Staci Righter M.D.   On: 11/05/2015 13:42   Ct Angio Neck W/cm &/or Wo/cm  11/05/2015  CLINICAL DATA:  Patient with LVAD presenting with acute onset of weakness and numbness involving the LEFT arm and leg 11/04/2015. Improving weakness.  Patient anticoagulated. EXAM: CT ANGIOGRAPHY HEAD AND NECK TECHNIQUE: Multidetector CT imaging of the head and neck was performed using the standard protocol during bolus administration of intravenous contrast. Multiplanar CT image reconstructions and MIPs were obtained to evaluate the vascular anatomy. Carotid stenosis measurements (when applicable) are obtained utilizing NASCET criteria, using the distal internal carotid diameter as the denominator. CONTRAST:  25mL OMNIPAQUE IOHEXOL 350 MG/ML SOLN COMPARISON:  Noncontrast CT head 11/04/2015. FINDINGS: CT HEAD Calvarium and skull base: No fracture or destructive lesion. Mastoids and middle ears are grossly clear. Paranasal sinuses: Chronic LEFT maxillary sinus disease. Orbits: BILATERAL cataract extraction. Brain: No evidence of acute abnormality, including acute infarct, hemorrhage, hydrocephalus, or mass lesion. Mild atrophy with small vessel disease. CTA NECK Aortic arch: Standard branching. Imaged portion shows no evidence of aneurysm or dissection. No significant stenosis of the major arch vessel origins. Moderate atheromatous calcification. Right carotid system: Heavily calcified distal common carotid artery without flow reducing lesion. Luminal measurement of 2.8 mm cervical ICA diameter of 4.2 mm, non flow reducing. Additional the included calcific plaque is seen in the proximal RIGHT ICA, again without flow reducing lesion. No soft plaque or ulceration. Dolichoectatic cervical ICA. No dissection. Left carotid system: No evidence of dissection, stenosis (50% or greater) or occlusion. Vertebral arteries: Dominant RIGHT vertebral. Minor ostial calcification without flow  reducing lesion. No evidence of dissection, stenosis (50% or greater) or occlusion. Nonvascular soft tissues: Severe COPD. BILATERAL pleural effusions incompletely evaluated. Cervical spondylosis. CTA HEAD Anterior circulation: Minor calcific atheromatous change in the cavernous carotids. Mild  irregularity of the M3 branches bilaterally without visible occlusion. No significant stenosis, proximal occlusion, aneurysm, or vascular malformation. Posterior circulation: RIGHT vertebral dominant. No significant stenosis, proximal occlusion, aneurysm, or vascular malformation. Venous sinuses: As permitted by contrast timing, patent. Anatomic variants: None of significance. Delayed phase:   No abnormal intracranial enhancement. IMPRESSION: No flow reducing intracranial or extracranial atherosclerotic lesion. No findings of acute cerebral infarction on the basis of CT/CTA. A small subcortical acute infarct could be difficult to visualize, in the setting of chronic small vessel disease. Severe COPD. Electronically Signed   By: Staci Righter M.D.   On: 11/05/2015 13:42     Medications:     Scheduled Medications: . amLODipine  10 mg Oral Daily  . atorvastatin  80 mg Oral Daily  . clopidogrel  75 mg Oral Daily  . docusate sodium  200 mg Oral Daily  . ferrous gluconate  324 mg Oral BID WC  . levothyroxine  100 mcg Oral Q Mon  . levothyroxine  200 mcg Oral QAC breakfast  . multivitamin with minerals  1 tablet Oral QHS  . pantoprazole  40 mg Oral BID  . ranolazine  500 mg Oral BID  . 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 Presented to [facility] with left sided weakness. Has had 2x subtherapeutic INRs in the last 1 month.   1. CVA: Suspected with left-sided weakness.  Improved from initial presentation but still present.  Initial CT negative for acute abnormality, CTA head/neck repeated did not visualize definite CVA.  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  This remains potential source for CVA.  Echo stable.  - Vascular suggests carotid angiogram to more closely assess degree of stenosis in right carotid system.  Would like to see him out a bit from the acute event before holding anticoagulation (with bridging).  Will have him followup with Dr. Kellie Simmering soon as outpatient.  - No problems walking.  - He has been on coumadin with goal INR 2-2.5, currently therapeutic (will continue).  History of GI bleeding with discontinuation of aspirin.  Plavix begun yesterday. 2. Chronic systolic HF: s/p LVAD [id number]. Volume status stable. Can get po lasix as needed. 3.Atrial arrhythmias: Atrial fibrillation, atrial tachycardia. S/p DC-CV 06/04/14 and repeat DC-CV 03/02/15.Interrogation of device revealed atrial fibrillation briefly on 11/22 and 12/1.  He is in NSR currently and INR therapeutic. Ranexa to help decrease risk of recurrence.  4. HTN: MAP remains in 90s.  Initially did not increase meds as in setting of acute CVA.  At this point, think we can increase amlodipine to 10 mg daily.   5. LVAD: LVAD parameters reviewed personally, stable.  No evidence for large pump thrombus => LDH stable, LVAD parameters stable.  6. CKD, stage III: Creatinine stable. 7. Anticoagulation management: Goal INR 2-2.5. He is off ASA with GI bleeding.  Started Plavix.  8. 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 Watch closely with Plavix addition. 9. Lumbar compression fracture: Had surgery scheduled for 11/29/15. This will likely be further delayed. 10. Disposition: I think that he can go home today.  Will need LVAD clinic followup in 1 week with INR.  Needs followup as soon as possible with Dr Kellie Simmering at [facility].  Meds: Plavix 75 mg daily, warfarin goal INR 2-2.5, amlodipine 10 mg daily, atorvastatin 80 mg daily, Ranexa 500 bid, protonix 40 daily, other meds as prior to admission.    I reviewed the LVAD parameters from today,  and compared the results to the patient's prior recorded data.  No programming changes were made.  The LVAD is functioning within specified parameters.  The patient performs LVAD self-test daily.  LVAD interrogation was negative for any significant power changes, alarms or PI events/speed drops.  LVAD equipment check completed and is in good working order.  Back-up equipment present.   LVAD education done on emergency procedures and precautions and reviewed exit site care.  Length of Stay: 3  Loralie Champagne 11/07/2015, 8:29 AM  VAD Team --- VAD ISSUES ONLY--- Pager [phone] (7am - 7am)  Advanced Heart Failure Team  Pager [phone] (M-F; 7a - 4p)  Please contact [facility] for night-coverage after hours (4p -7a ) and weekends on [email or web address]

## 2015-11-07 NOTE — Discharge Instructions (Signed)
Information on my medicine - Coumadin   (Warfarin)  This medication education was reviewed with me or my healthcare representative as part of my discharge preparation.  The pharmacist that spoke with me during my hospital stay was:  Adora Fridge, [facility]  Why was Coumadin prescribed for you? Coumadin was prescribed for you because you have a blood clot or a medical condition that can cause an increased risk of forming blood clots. Blood clots can cause serious health problems by blocking the flow of blood to the heart, lung, or brain. Coumadin can prevent harmful blood clots from forming. As a reminder your indication for Coumadin is:   Blood Clot Prevention After Heart Pump Surgery  What test will check on my response to Coumadin? While on Coumadin (warfarin) you will need to have an INR test regularly to ensure that your dose is keeping you in the desired range. The INR (international normalized ratio) number is calculated from the result of the laboratory test called prothrombin time (PT).  If an INR APPOINTMENT HAS NOT ALREADY BEEN MADE FOR YOU please schedule an appointment to have this lab work done by your health care provider within 7 days. Your INR goal is usually a number between:  2 to 3 or your provider may give you a more narrow range like 2-2.5.  Ask your health care provider during an office visit what your goal INR is.  What  do you need to  know  About  COUMADIN? Take Coumadin (warfarin) exactly as prescribed by your healthcare provider about the same time each day.  DO NOT stop taking without talking to the doctor who prescribed the medication.  Stopping without other blood clot prevention medication to take the place of Coumadin may increase your risk of developing a new clot or stroke.  Get refills before you run out.  What do you do if you miss a dose? If you miss a dose, take it as soon as you remember on the same day then continue your regularly scheduled regimen the next  day.  Do not take two doses of Coumadin at the same time.  Important Safety Information A possible side effect of Coumadin (Warfarin) is an increased risk of bleeding. You should call your healthcare provider right away if you experience any of the following: ? Bleeding from an injury or your nose that does not stop. ? Unusual colored urine (red or dark brown) or unusual colored stools (red or black). ? Unusual bruising for unknown reasons. ? A serious fall or if you hit your head (even if there is no bleeding).  Some foods or medicines interact with Coumadin (warfarin) and might alter your response to warfarin. To help avoid this: ? Eat a balanced diet, maintaining a consistent amount of Vitamin K. ? Notify your provider about major diet changes you plan to make. ? Avoid alcohol or limit your intake to 1 drink for women and 2 drinks for men per day. (1 drink is 5 oz. wine, 12 oz. beer, or 1.5 oz. liquor.)  Make sure that ANY health care provider who prescribes medication for you knows that you are taking Coumadin (warfarin).  Also make sure the healthcare provider who is monitoring your Coumadin knows when you have started a new medication including herbals and non-prescription products.  Coumadin (Warfarin)  Major Drug Interactions  Increased Warfarin Effect Decreased Warfarin Effect  Alcohol (large quantities) Antibiotics (esp. Septra/Bactrim, Flagyl, Cipro) Amiodarone (Cordarone) Aspirin (ASA) Cimetidine (Tagamet) Megestrol (Megace) NSAIDs (  ibuprofen, naproxen, etc.) °Piroxicam (Feldene) °Propafenone (Rythmol SR) °Propranolol (Inderal) °Isoniazid (INH) °Posaconazole (Noxafil) Barbiturates (Phenobarbital) °Carbamazepine (Tegretol) °Chlordiazepoxide (Librium) °Cholestyramine (Questran) °Griseofulvin °Oral Contraceptives °Rifampin °Sucralfate (Carafate) °Vitamin K  ° °Coumadin® (Warfarin) Major Herbal Interactions  °Increased Warfarin Effect Decreased Warfarin Effect   °Garlic °Ginseng °Ginkgo biloba Coenzyme Q10 °Green tea °St. John’s wort   ° °Coumadin® (Warfarin) FOOD Interactions  °Eat a consistent number of servings per week of foods HIGH in Vitamin K °(1 serving = ½ cup)  °Collards (cooked, or boiled & drained) °Kale (cooked, or boiled & drained) °Mustard greens (cooked, or boiled & drained) °Parsley *serving size only = ¼ cup °Spinach (cooked, or boiled & drained) °Swiss chard (cooked, or boiled & drained) °Turnip greens (cooked, or boiled & drained)  °Eat a consistent number of servings per week of foods MEDIUM-HIGH in Vitamin K °(1 serving = 1 cup)  °Asparagus (cooked, or boiled & drained) °Broccoli (cooked, boiled & drained, or raw & chopped) °Brussel sprouts (cooked, or boiled & drained) *serving size only = ½ cup °Lettuce, raw (green leaf, endive, romaine) °Spinach, raw °Turnip greens, raw & chopped  ° °These websites have more information on Coumadin (warfarin):  www.coumadin.com; °www.ahrq.gov/consumer/coumadin.htm; ° ° ° °

## 2015-11-07 NOTE — Progress Notes (Signed)
Pt was discharged home with spouse. Discharge instructions reviewed with pt and spouse and all questions answered.

## 2015-11-07 NOTE — Care Management Important Message (Signed)
Important Message  Patient Details  Name: [name] MRN: [id number] Date of Birth: [birth year]   Medicare Important Message Given:  Yes    [name] 11/07/2015, 11:09 AMImportant Message  Patient Details  Name: [name] MRN: [id number] Date of Birth: Oct 19, 1941   Medicare Important Message Given:  Yes    [name] 11/07/2015, 11:09 AM

## 2015-11-07 NOTE — Progress Notes (Signed)
[place] for warfarin Indication: LVAD  Allergies  Allergen Reactions  . Demerol [Meperidine] Other (See Comments)    Paralysis. Could only move eyes.    Patient Measurements: Height: 6' (182.9 cm) Weight: 186 lb 9.6 oz (84.641 kg) IBW/kg (Calculated) : 77.6  Vital Signs: Temp: 98 F (36.7 C) (01/02 0746) Temp Source: Oral (01/02 0746) Pulse Rate: 66 (01/02 0746)  Labs:  Recent Labs  11/04/15 1310  11/05/15 0319 11/06/15 0209 11/07/15 0224  HGB 10.8*  < > 10.9* 10.9* 10.8*  HCT 33.5*  < > 34.1* 33.4* 33.6*  PLT 183  --  194 194 193  APTT 44*  --   --   --   --   LABPROT 25.4*  --  24.9* 24.8* 23.7*  INR 2.34*  --  2.28* 2.27* 2.13*  CREATININE 1.55*  < > 1.28* 1.34* 1.22  < > = values in this interval not displayed.  Estimated Creatinine Clearance: 56.5 mL/min (by C-G formula based on Cr of 1.22).   Assessment: 75 y/o male with LVAD who presents to the ED with left arm numbness and weakness. Code stroke called and tPA deferred as INR is therapeutic. Neuro suspects acute right subcortical TIA or possible acute small vessel stroke. Pharmacy consulted to manage warfarin inpatient. INR was therapeutic at 2.34 on admit.   INR continues to be at goal today at 2.13 (within lower range). Will continue with home regimen dose tonight. Noted that plavix is being added.  PTA regimen: 2 mg daily except 4 mg on M/W/F (last dose 12/29)  Goal of Therapy:  INR 2-2.5 per anticoag clinic note Monitor platelets by anticoagulation protocol: Yes   Plan:  - Warfarin 4 mg PO tonight and send home on home regimen of 2 mg daily except 4 mg on Mon/Wed/Fri - INR daily - Monitor for s/sx of bleeding  Angelena Sand K. [name], PharmD, BCPS, CPP Clinical Pharmacist Pager: [phone] Phone: [phone] 11/07/2015 8:30 AM

## 2015-11-08 ENCOUNTER — Telehealth: Payer: Self-pay | Admitting: Vascular Surgery

## 2015-11-08 ENCOUNTER — Telehealth (HOSPITAL_COMMUNITY): Payer: Self-pay | Admitting: *Deleted

## 2015-11-08 LAB — HEMOGLOBIN A1C
HEMOGLOBIN A1C: 5.5 % (ref 4.8–5.6)
Mean Plasma Glucose: 111 mg/dL

## 2015-11-08 NOTE — Telephone Encounter (Signed)
Wife called to schedule hospital d/c follow up with the following appts made:   Thursday 11/10/15 - home INR check  Monday 11/14/15 - 11:00 am VAD clinic appt  [name] verbalized understanding of above.

## 2015-11-08 NOTE — Telephone Encounter (Signed)
Spoke with pt re appt, dpm  °

## 2015-11-08 NOTE — Telephone Encounter (Signed)
-----   Message from Mena Goes, RN sent at 11/08/2015  9:50 AM EST ----- Regarding: schedule   ----- Message -----    From: Conrad [place], MD    Sent: 11/08/2015   9:41 AM      To: [facility] [address]  [name] [id number] 06-Dec-1940  Follow-up: 2 weeks with Dr. Kellie Simmering

## 2015-11-10 ENCOUNTER — Ambulatory Visit (HOSPITAL_COMMUNITY): Payer: Medicare Other | Admitting: *Deleted

## 2015-11-10 LAB — POCT INR: INR: 2.4

## 2015-11-14 ENCOUNTER — Telehealth (HOSPITAL_COMMUNITY): Payer: Self-pay | Admitting: *Deleted

## 2015-11-14 ENCOUNTER — Ambulatory Visit (HOSPITAL_COMMUNITY)
Admission: RE | Admit: 2015-11-14 | Discharge: 2015-11-14 | Disposition: A | Discharge status: Home / Self Care | Payer: Medicare Other | Source: Ambulatory Visit | Attending: Cardiology | Admitting: Cardiology

## 2015-11-14 ENCOUNTER — Other Ambulatory Visit (HOSPITAL_COMMUNITY): Payer: Self-pay | Admitting: Infectious Diseases

## 2015-11-14 ENCOUNTER — Ambulatory Visit (HOSPITAL_COMMUNITY): Payer: Self-pay | Admitting: *Deleted

## 2015-11-14 DIAGNOSIS — I5022 Chronic systolic (congestive) heart failure: Secondary | ICD-10-CM | POA: Insufficient documentation

## 2015-11-14 DIAGNOSIS — N200 Calculus of kidney: Secondary | ICD-10-CM | POA: Insufficient documentation

## 2015-11-14 DIAGNOSIS — I130 Hypertensive heart and chronic kidney disease with heart failure and stage 1 through stage 4 chronic kidney disease, or unspecified chronic kidney disease: Secondary | ICD-10-CM | POA: Insufficient documentation

## 2015-11-14 DIAGNOSIS — Z7902 Long term (current) use of antithrombotics/antiplatelets: Secondary | ICD-10-CM | POA: Diagnosis not present

## 2015-11-14 DIAGNOSIS — Z955 Presence of coronary angioplasty implant and graft: Secondary | ICD-10-CM | POA: Insufficient documentation

## 2015-11-14 DIAGNOSIS — E039 Hypothyroidism, unspecified: Secondary | ICD-10-CM | POA: Diagnosis not present

## 2015-11-14 DIAGNOSIS — I255 Ischemic cardiomyopathy: Secondary | ICD-10-CM | POA: Diagnosis not present

## 2015-11-14 DIAGNOSIS — M545 Low back pain, unspecified: Secondary | ICD-10-CM

## 2015-11-14 DIAGNOSIS — M4856XA Collapsed vertebra, not elsewhere classified, lumbar region, initial encounter for fracture: Secondary | ICD-10-CM | POA: Diagnosis not present

## 2015-11-14 DIAGNOSIS — Z7901 Long term (current) use of anticoagulants: Secondary | ICD-10-CM | POA: Insufficient documentation

## 2015-11-14 DIAGNOSIS — Z95828 Presence of other vascular implants and grafts: Secondary | ICD-10-CM | POA: Diagnosis not present

## 2015-11-14 DIAGNOSIS — N183 Chronic kidney disease, stage 3 (moderate): Secondary | ICD-10-CM | POA: Diagnosis not present

## 2015-11-14 DIAGNOSIS — Z85820 Personal history of malignant melanoma of skin: Secondary | ICD-10-CM | POA: Insufficient documentation

## 2015-11-14 DIAGNOSIS — Z8673 Personal history of transient ischemic attack (TIA), and cerebral infarction without residual deficits: Secondary | ICD-10-CM | POA: Insufficient documentation

## 2015-11-14 DIAGNOSIS — I252 Old myocardial infarction: Secondary | ICD-10-CM | POA: Insufficient documentation

## 2015-11-14 DIAGNOSIS — I2510 Atherosclerotic heart disease of native coronary artery without angina pectoris: Secondary | ICD-10-CM | POA: Diagnosis not present

## 2015-11-14 DIAGNOSIS — E785 Hyperlipidemia, unspecified: Secondary | ICD-10-CM | POA: Insufficient documentation

## 2015-11-14 DIAGNOSIS — Z79899 Other long term (current) drug therapy: Secondary | ICD-10-CM | POA: Insufficient documentation

## 2015-11-14 DIAGNOSIS — Z95811 Presence of heart assist device: Secondary | ICD-10-CM | POA: Insufficient documentation

## 2015-11-14 DIAGNOSIS — I4891 Unspecified atrial fibrillation: Secondary | ICD-10-CM | POA: Insufficient documentation

## 2015-11-14 DIAGNOSIS — I6389 Other cerebral infarction: Secondary | ICD-10-CM

## 2015-11-14 DIAGNOSIS — I471 Supraventricular tachycardia: Secondary | ICD-10-CM | POA: Diagnosis not present

## 2015-11-14 DIAGNOSIS — I638 Other cerebral infarction: Secondary | ICD-10-CM

## 2015-11-14 LAB — BASIC METABOLIC PANEL
Anion gap: 10 (ref 5–15)
BUN: 18 mg/dL (ref 6–20)
CALCIUM: 9.3 mg/dL (ref 8.9–10.3)
CO2: 22 mmol/L (ref 22–32)
Chloride: 108 mmol/L (ref 101–111)
Creatinine, Ser: 1.36 mg/dL — ABNORMAL HIGH (ref 0.61–1.24)
GFR calc Af Amer: 57 mL/min — ABNORMAL LOW (ref >60)
GFR, EST NON AFRICAN AMERICAN: 49 mL/min — AB (ref >60)
GLUCOSE: 99 mg/dL (ref 65–99)
Potassium: 4.4 mmol/L (ref 3.5–5.1)
SODIUM: 140 mmol/L (ref 135–145)

## 2015-11-14 LAB — LACTATE DEHYDROGENASE: LDH: 254 U/L — AB (ref 98–192)

## 2015-11-14 LAB — CBC
HEMATOCRIT: 37 % — AB (ref 39.0–52.0)
Hemoglobin: 11.6 g/dL — ABNORMAL LOW (ref 13.0–17.0)
MCH: 30.4 pg (ref 26.0–34.0)
MCHC: 31.4 g/dL (ref 30.0–36.0)
MCV: 97.1 fL (ref 78.0–100.0)
Platelets: 254 10*3/uL (ref 150–400)
RBC: 3.81 MIL/uL — AB (ref 4.22–5.81)
RDW: 15.4 % (ref 11.5–15.5)
WBC: 8 10*3/uL (ref 4.0–10.5)

## 2015-11-14 LAB — PROTIME-INR
INR: 2.36 — ABNORMAL HIGH (ref 0.00–1.49)
Prothrombin Time: 25.6 seconds — ABNORMAL HIGH (ref 11.6–15.2)

## 2015-11-14 MED ORDER — FUROSEMIDE 20 MG PO TABS: 20.0000 mg | ORAL_TABLET | ORAL | Status: DC | PRN

## 2015-11-14 NOTE — Progress Notes (Addendum)
Presents today with [name] (significant other).   Symptom  Yes  No  Details   Angina       x Activity:   Claudication        x How far:   Syncope        x When:     Stroke        x        TIA  2001  Orthopnea         x How many pillows: 1  PND         x How often:  CPAP       N/A How many hrs:   Pedal edema        x        R>L baseline + injury  Abd fullness               x    N&V               x  "gagging episodes" resolved with gabapentin  Diaphoresis               x   Bleeding              x   Urine color        yellow  SOB        x      Activity: incline, no more than normal.   Palpitations        x When:   ICD shock        x   Hospitlizaitons                 x When/where/why:   ED visit        x When/where/why:  Other MD              x When/who/why:   Activity        Off work due to injuries  Fluid    No limitations   Diet    Low salt food; Poor appetite   Vital signs: HR: 68 MAP BP: 74 Auto cuff:  O2 Sat:  97% Wt: 195.6  lbs Last wt: 205.2  lbs  Ht: 6'0"  LVAD interrogation reveals (Reviewed with Dr Aundra Dubin):  Speed:   9000 Flow: 3.4 - 4.0 Power: 4.4 PI: 4.2 - 6.2 (mostly ~4.5 - 5.0) Alarms: few low voltage advisories Events:  0 - 5 PI events several days during the week.  Fixed speed:  9000 Low speed limit:  8400 Primary Controller:  Replace back up battery in 29 months   Back up controller:   Replace back up battery in 9 months (March 2017)  LVAD interrogated and functioning as expected. Interrogation NEGATIVE for power spikes, and NEGATIVE for significant PI Events or speed drops. Improved on lower speed.   Driveline Site: Drive line site fully healed and incorporated. Velour is fully implanted at the site. They have adequate dressing supplies at home.   Encounter Details: Reports to clinic for 2 week follow up. Feeling better aside from his back pain. Surgery is scheduled for 10/25/15 with Dr. Vertell Limber to do L3-L5 fusion. Has not been taking lasix daily over  concern of dehydration, which has been his trend in the past. Had diarrhea from Augmentin and stopped after 10 days. Took one immodium and symptoms have resided. Sinusitus symptoms have improved. No further hematuria and Hgb stable today.   10 lb weight loss in the last  2 weeks--> reports to have little to no appetite. [name] (SO) said she is giving him 2 boosts a day now to try to keep weight on. Counseled to really try to push himself to eat especially protein containing foods (nuts, cheese, eggs, etc.) as he is able especially with upcoming surgery in ~ 2 weeks.   Significant decrease in leg strength since falls. Walking about a half a mile a day (in several episodes). Requires a cane to walk. Unable to complete 6MW test today d/t intolerance for prolonged activity due to his back pain and leg weakness.   BNP 491 (down from 707 when he came in to ED needing IV diuresis). Fluid status today stable. Not dry but acceptable barring he has no shortness of breath at rest per Dr. Claris Gladden assessment.   INR 1.63 --> started Boost drinks BID from QD. See anticoag note.   LDH 247 --> stable within established baseline 220 - 300  Plan for Pre Op testing 10/19/15. Hospital Admission 10/21/15 for heparin bridge prior to surgery when INR <2.0. Last dose of coumadin will be 10/20/15 PM (5d prior to procedure). With his reduced goal 2 - 2.5 hopefully it will not be long for INR to drift down to <1.5 as requested from neurosurgery.   RTC 3 weeks.   Frank Madeira, RN VAD Coordinator   Office: [phone] 24/7 VAD Pager: [phone]   Patient ID: [name], male   DOB: [birth year], 75 y.o.   MRN: [id number]   VAD CLINIC PROGRESS NOTE  PCP: Dr. Kandice Robinsons ([place])  [name] is a 75 yo male with h/o VT, PAD and severe systolic HF (EF 0000000) due to mixed ischemic/nonischemic CM he is s/p HM II LVAD implant for destination therapy on October 19, 2013. On 09/01/14 underwent cystoscopy and flexible  ureteroscopy with lithotripsy and basket extraction.   VAD implantation was complicated by VT, syncope, A fib/Aflutter and persistent low output felt to be due to RHF. RHC showed low cardiac output but no evidence of RV failure on Milrinone. He was felt to be volume depleted. He was discharged home on 11/18/13 on mirlinone and PRN lasix 20 mg for a weight of 200 pounds or greater. Milrinone has since been weaned off.   RHC (07/13/14): Well compensated hemodynamics with VAD support.  Admitted 11/9-11/19/15 for hematuria and chronic nephrolithiasis. Underwent Echo to assess RV for concern RV failure because he presented with SOB. RV looked good and had renal US showed mild hyronephrosis and bilateral renal cysts. Dr. Risa Grill took back for repeat lithotripsy and stone extraction by ureteroscopy and J stent placement. Received total of 3 PRBCs. D/C weight 188 lbs.   In 4/16 admitted with weakness and fall. In ER hemoglobin was found to be 5.4 with MAP 78.  While hospitalized he received 5U PRBCs. 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 Then readmitted with acute L4 compression fracture. Most recently admitted with weakness and low PI. Found to have recurrent atrial tach and underwent TEE/DCCV on 03/02/15.  He was admitted in 12/16 with CVA causing left-sided weakness.  CT did not show CVA and unable to undergo MRI with device.  There was initially some concern for significant carotid stenosis, but this was reviewed with Dr Kellie Simmering who did not think that the carotid disease was significant. He was started on Plavix.   Follow up LVAD:  He returns for followup.  His left-sided weakness has almost completely resolved.  Taking Plavix in addition to warfarin.  No melena/BRBPR.  MAP 82 today.  He is steady on his feet, no falls. Dyspnea with inclines.  No lightheadedness.  Main problem remains low back pain from L-spine compression fracture.  He has seen Dr. Vertell Limber and needs  L-spine surgery.   LVAD interrogation reveals:  See LVAD nurse's note above.  Labs (10/16): Hgb 12.2 => 12.7, BNP 305, K 4.4, creatinine 1.46, LDL 264 Labs (12/16): LDL 60, HDL 33   Past Medical History  Diagnosis Date  . Bradycardia   . Ventricular tachycardia ([place])     a. s/p AICD;   h/o ICD shock;  Amiodarone Rx. S/P ablation in 2012  . PVD (peripheral vascular disease) ([place])     a. 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 HTN (hypertension)   . HLD (hyperlipidemia)   . Cytopenia   . Hypothyroidism   . CKD (chronic kidney disease)   . Chronic systolic heart failure ([place])     a. s/p LVAD 10/2013 for destination therapy  . Ischemic cardiomyopathy   . CVD (cerebrovascular disease)     a. s/p L CEA 2008  . Stroke [facility])     a. 10/2015 admission   . Inappropriate therapy from implantable cardioverter-defibrillator     a. ATP for sinus tach SVT wavelet identified SVT but was no  passive (2014)  . Automatic implantable cardioverter-defibrillator in situ     a. s/p Medtronic Evera device serial [id number]    . 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 L Ear   . PAF (paroxysmal atrial fibrillation) ([place])   . Sleep apnea   . CAD (coronary artery disease)     a. s/p MI 1982;  s/p DES to CFX 2011;  s/p NSTEMI 4/12 in setting of VTach;  cath 7/12:  LM ok, LAD mid 30-40%, Dx's with 40%, mCFX stent patent, prox to mid RCA occluded with R to R and L to R collats.  Medical Tx was continued  . History of GI bleed     a. on ASA- started on plavix during 10/2015 admission for CVA    Current Outpatient Prescriptions  Medication Sig Dispense Refill  . acetaminophen (TYLENOL) 325 MG tablet Take 650 mg by mouth every 6 (six) hours as needed (pain).    [name] amLODipine (NORVASC) 10 MG tablet Take 1 tablet (10 mg total) by mouth at bedtime. 30 tablet 6  . atorvastatin (LIPITOR) 80 MG tablet Take 80 mg by mouth daily.      . clopidogrel (PLAVIX) 75 MG tablet Take 1 tablet (75 mg total) by mouth daily. 30 tablet 6  . docusate sodium (COLACE) 100 MG capsule Take 200 mg by mouth daily.     . ferrous gluconate (FERGON) 324 MG tablet Take 1 tablet (324 mg total) by mouth 2 (two) times daily with a meal. 60 tablet 6  . furosemide (LASIX) 20 MG tablet Take 1 tablet (20 mg total) by mouth as needed for edema (weight gain). 30 tablet 3  . levothyroxine (SYNTHROID, LEVOTHROID) 200 MCG tablet Take one 200 mcg daily before breakfast every day with 1 1/2 (300 mcg) every Monday. 45 tablet 6  . Multiple Vitamin (MULTIVITAMIN WITH MINERALS) TABS tablet Take 1 tablet by mouth at bedtime.    . pantoprazole (PROTONIX) 40 MG tablet Take 1  tablet (40 mg total) by mouth 2 (two) times daily. 60 tablet 6  . RANEXA 500 MG 12 hr tablet TAKE 1 TABLET (500 MG TOTAL) BY MOUTH 2 (TWO) TIMES DAILY. 60 tablet 6  . warfarin (COUMADIN) 2 MG tablet Take 2-4 mg by mouth daily. Take 4 mg by mouth Monday, Wednesday, and Friday. Take 2 mg by mouth all other days.    [name] zinc gluconate 50 MG tablet Take 100 mg by mouth at bedtime.      No current facility-administered medications for this encounter.    Demerol  REVIEW OF SYSTEMS: All systems negative except as listed in HPI, PMH and Problem list.  MAP 82, HR 76  Physical Exam: GENERAL: NAD HEENT: normal NECK: Supple, JVP 7, no carotid bruits.  No lymphadenopathy or thyromegaly appreciated.   CARDIAC:  Mechanical heart sounds with LVAD hum present.  LUNGS:  clear.  ABDOMEN:  Soft, round, nontender, positive bowel sounds x4.     LVAD exit site: well-healed and incorporated.  Dressing dry and intact.  No erythema or drainage.  Stabilization device present and accurately applied.  Driveline dressing is being changed weekly per sterile technique. EXTREMITIES:  Warm and dry, no cyanosis, clubbing, no edema.  NEUROLOGIC:  Alert and oriented x 4.  Gait steady.  No aphasia.  No dysarthria.  Affect  pleasant. Grossly normal bilateral upper and lower extremity strength.   ASSESSMENT AND PLAN:  1. Chronic systolic HF: s/p LVAD [id number].  Remains NYHA II-III.  Staying hydrated, rare PI events (stable).  - Continue Lasix prn.  2.  Atrial arrhythmias: Atrial fibrillation, atrial tachycardia.  S/p DC-CV 06/04/14 and repeat DC-CV 03/02/15.  Now off amio due to thyrotoxicity. Watch closely for recurrent AF.  3. HTN: MAP better on increased amlodipine.  4. LVAD: LVAD parameters reviewed personally, stable. 5.CKD, stage III: BMET today.  6. Anticoagulation management: Goal INR 2-2.5. He is off ASA with GI bleeding.  However, Plavix was started recently with CVA.  No evidence so far for recurrent GI bleeding.   - CBC and INR today.  7. Nephrolithiasis s/p Renal Stone lithotripsy: Per Dr Risa Grill. Stable. 8. 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 Now on Plavix in addition to warfarin.  Pending CBC today.  9. Lumbar compression fracture: He will need surgery with Dr. Vertell Limber.  He is in a lot of pain.  He was scheduled for procedure this month.  However, I would favor waiting at least a month longer now that he has had a CVA.  We tried to contact Dr Vertell Limber today but he is on vacation, will need to followup with him when he returns.  10. 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  Continue atorvastatin. Plavix added to regimen (also warfarin INR goal 2-2.5).  Reviewed carotid studies with Dr Kellie Simmering, do not think that there is surgical disease present.     Loralie Champagne MD 11/15/2015

## 2015-11-14 NOTE — Telephone Encounter (Signed)
Called Dr. Melven Sartorius office re: upcoming spinal surgery scheduled 11/29/15. VAD coordinator reported to [facility] optician last week) of pt's recent admission for CVA.  Dr. Aundra Dubin is asking for clarification of upcoming surgery and expects it to be delayed due to recent stroke.  Dr. Vertell Limber out of office this week; Hildred Alamin out of office today due to inclement weather.  Dr. Claris Gladden direct contact provided and asked if Dr. Vertell Limber could call him directly when he returns to office next week.

## 2015-11-14 NOTE — Progress Notes (Addendum)
LVAD Reassessment Psychosocial Screening  Date/Time Initiated:  10/20/2015  LVAD Implant date:  10/19/2013 Annual: XXX 17-month:  Name:  [name] Address:  [place] [address] Home Phone:  [phone] (home) Cell:   No relevant phone numbers on file.   Marital Status:  Divorced Faith:  "in [place]" Language:  English DOB:  02/06/1941   Medical & Follow-up Adherence to Medical regimen/INR checks:  compliant Medication adherence:  compliant Physician/Clinic Appointment Attendance:  compliant Do you smoke?  No- quit 2003 Do you drink alcohol?  occasional Have you ever used illegal drugs or misused medications?  no Any changes to your medical history since receiving the LVAD?   Kidney stone and back fx Do you have needed supplies for care of LVAD?  yes Do you have a charge/co-pay for supplies/INR visits?  $15 monthly fee for INR meter Emergency Plan for electrical outage?  Just moved and have a firehouse close by Do you drive?  yes What is your plan for transportation?  [name] if needed  Advance Directives: <no information> Patient reports he has it on file.  Psychological Health Appearance:  Well groomed Mental Status:  Alert and oriented Eye Contact:  good Thought Content:  Clear and coherent  Speech:  clear Mood:  stable Affect:  positive Insight:  appropriate Judgement: sound Interaction Style:  positive  Family/Social Information Who lives in your home? Name:   Relationship:   [name] Other family members/support persons in your life? Name:   Relationship:   [name]   daughter Caregiving Needs Who is the primary caregiver? Name:  [name] Who is the secondary caregiver? Name: [name]- former neighbor Self-care: independent Ambulation: cane Limitations: none Any barriers impacting ability to participate in care? Back problems Who does your driveline site dressing changes?  [name] How often are dressings changed? weekly    Community Are you active with community agencies/resources/homecare? none Are you active in a church, Glass blower/designer, mosque or other faith based community? none What other sources do you have for spiritual support? none Are you active in any clubs or social organizations?none What do you do for fun?  Hobbies?  Interests? Play golf and go riding  Financial Information What is your source of income? Social Security Are you working? No Do you have difficulty meeting your monthly expenses? No  Health Insurance Primary Health Insurance: Medicare Secondary Insurance: Industry History  How have you been feeling in the past year? Doing fine Do you have any concerns with body image with the LVAD implant? No bother How do you handle stressful situations? "what stress" "yell" Patient stated coping strategies:  "yell" Are there any other stressors in your life? no Have you had any past or current thoughts of suicide? no How many hours do you sleep at night? 8-9 hours How is your appetite? Getting better   Hospital Anxiety and Depression Screen (HADS) score: 4 Do you need to see a counselor, psychiatrist or therapist?   no Do you attend or wish to attend the LVAD support group?  Attends regularly  Patient stated explanation how their life has improved as a result of receiving the LVAD:  "I wouldn't be here" Patient's biggest concern or fear about living with the LVAD: "when one of the alarms goes off" Patient states the following coping mechanisms for concerns and fears of LVAD: "yell for [name]" Patient stated barriers to living with the LVAD: "would like to stand in the shower for a  while" Patient feels they had adequate support from the VAD team?  Yes -appreciates the VAD pager Patient stated strategies  to improve support from the VAD team:  n/a  Caregiver questions How has your life been affected since your spouse/partner received the LVAD?  Not much difference- more  mindful of his needs Caregiver stated biggest concern or fear about caregiving with an LVAD patient:  "If I'm not there' Caregiver stated coping strategies for concerns and fears: research everything- "make sure I know what I am doing" Caregiver stated barriers to caregiving: none Caregiver HADS score: 2 Caregiver feels receives adequate support from the VAD team? Yes - I can call or text anytime Caregiver stated strategies to improve support from the VAD team:    Clinical Intervention Needed: None needed at this time. CSW will continue to encourage attendance at the LVAD Support Group.  Clinical Impressions/Recommendations: Patient and SO [name] appear to be doing well despite some medical complications over the past year. Patient has a very positive outlook and with the constant support from [name] he has done well. Patient and SO continue to attend the LVAD support group and are active members who have supported new members and [name] has been a great support to overwhelmed caregivers in the early days of implant.   Louann Liv, LCSW

## 2015-11-14 NOTE — Patient Instructions (Signed)
1.  No change in meds 2.  No change in coumadin dose 3.  Return to [place] clinic in 3 weeks

## 2015-11-17 ENCOUNTER — Ambulatory Visit (HOSPITAL_COMMUNITY): Payer: Medicare Other | Admitting: *Deleted

## 2015-11-17 LAB — POCT INR: INR: 2.1

## 2015-11-21 ENCOUNTER — Inpatient Hospital Stay (HOSPITAL_COMMUNITY): Admission: RE | Admit: 2015-11-21 | Payer: Medicare Other | Source: Ambulatory Visit

## 2015-11-21 ENCOUNTER — Encounter (HOSPITAL_COMMUNITY): Payer: Self-pay | Admitting: Vascular Surgery

## 2015-11-22 ENCOUNTER — Other Ambulatory Visit (HOSPITAL_COMMUNITY): Payer: Medicare Other

## 2015-11-22 ENCOUNTER — Encounter: Payer: Medicare Other | Admitting: Vascular Surgery

## 2015-11-24 ENCOUNTER — Ambulatory Visit: Payer: Medicare Other | Admitting: Infectious Diseases

## 2015-11-24 LAB — POCT INR: INR: 1.9

## 2015-11-29 ENCOUNTER — Encounter: Admission: RE | Payer: Self-pay | Source: Ambulatory Visit

## 2015-11-29 ENCOUNTER — Inpatient Hospital Stay: Admission: RE | Admit: 2015-11-29 | Payer: Medicare Other | Source: Ambulatory Visit | Admitting: Neurosurgery

## 2015-11-29 SURGERY — POSTERIOR LUMBAR FUSION 2 LEVEL
Anesthesia: General | Site: Back

## 2015-11-29 NOTE — ED Provider Notes (Signed)
CSN: [id number]     Arrival date & time 11/04/15  1257 History   First MD Initiated Contact with Patient 11/04/15 1309     Chief Complaint  Patient presents with  . Code Stroke      HPI Pt reports that he started having left arm numbness and heaviness since 1130 today. Pt also reports left leg numbness which started on the way to the hospital. Pt also is an LVAD pt, LVAD coordinator aware.  Past Medical History  Diagnosis Date  . Bradycardia   . Ventricular tachycardia ([place])     a. s/p AICD;   h/o ICD shock;  Amiodarone Rx. S/P ablation in 2012  . PVD (peripheral vascular disease) ([place])     a. 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 HTN (hypertension)   . HLD (hyperlipidemia)   . Cytopenia   . Hypothyroidism   . CKD (chronic kidney disease)   . Chronic systolic heart failure ([place])     a. s/p LVAD 10/2013 for destination therapy  . Ischemic cardiomyopathy   . CVD (cerebrovascular disease)     a. s/p L CEA 2008  . Stroke [facility])     a. 10/2015 admission   . Inappropriate therapy from implantable cardioverter-defibrillator     a. ATP for sinus tach SVT wavelet identified SVT but was no  passive (2014)  . Automatic implantable cardioverter-defibrillator in situ     a. s/p Medtronic Evera device serial [id number]    . Melanoma of ear ([place])     a. L Ear   . PAF (paroxysmal atrial fibrillation) ([place])   . Sleep apnea   . CAD (coronary artery disease)     a. s/p MI 1982;  s/p DES to CFX 2011;  s/p NSTEMI 4/12 in setting of VTach;  cath 7/12:  LM ok, LAD mid 30-40%, Dx's with 40%, mCFX stent patent, prox to mid RCA occluded with R to R and L to R collats.  Medical Tx was continued  . History of GI bleed     a. on ASA- started on plavix during 10/2015 admission for CVA   Past Surgical History  Procedure Laterality Date  . Femoral-popliteal bypass graft Right 2011  . Cardiac defibrillator  placement  2003; 2005; 05/25/2013    2014: Medtronic Evera device serial number [id number]  . Revascularization / in-situ graft leg    . Renal artery bypass Bilateral 2003  . Back surgery    . Lumbar disc surgery  1974; 2000's  . Knee arthroscopy Bilateral 1990's  . Elbow arthroscopy Right 1990's  . Cholecystectomy  12/15/?2010  . Lipoma excision Left 2013    "near ear" (05/25/2013)  . Heel spur surgery Right 1990's  . Cataract extraction w/ intraocular lens  implant, bilateral Bilateral 2000  . Carotid endarterectomy Left ~ 2007  . Doppler echocardiography  2011  . Tonsillectomy  1946  . Femoral-popliteal bypass graft Left 05/2002    Archie Endo 05/06/2002 (05/25/2013)  . Tumor excision Left 1960's    "fatty tumor" (05/25/2013)  . Cardiac electrophysiology study and ablation  2012  . Coronary angioplasty  1992  . Cardiac catheterization      "several" (05/25/2013)  . Coronary angioplasty with stent placement      "last one was 11/2012  (05/25/2013)  . Cardioversion N/A 10/15/2013    Procedure: CARDIOVERSION;  Surgeon: Jolaine Artist, MD;  Location: [place];  Service: Cardiovascular;  Laterality: N/A;  . Insertion of implantable left ventricular assist device N/A 10/19/2013    Procedure: INSERTION OF IMPLANTABLE LEFT VENTRICULAR ASSIST DEVICE;  Surgeon: Gaye Pollack, MD;  Location: [place];  Service: Open Heart Surgery;  Laterality: N/A;  CIRC ARREST  NITRIC OXIDE  MEDTRONIC ICD  . Intraoperative transesophageal echocardiogram N/A 10/19/2013    Procedure: INTRAOPERATIVE TRANSESOPHAGEAL ECHOCARDIOGRAM;  Surgeon: Gaye Pollack, MD;  Location: [facility] OR;  Service: Open Heart Surgery;  Laterality: N/A;  . Tee without cardioversion N/A 11/04/2013    Procedure: TRANSESOPHAGEAL ECHOCARDIOGRAM (TEE);  Surgeon: Larey Dresser, MD;  Location: [place];  Service: Cardiovascular;  Laterality: N/A;  . Cardioversion N/A 11/04/2013    Procedure: CARDIOVERSION;  Surgeon: Larey Dresser, MD;  Location:  [place];  Service: Cardiovascular;  Laterality: N/A;  . Cardioversion N/A 06/04/2014    Procedure: CARDIOVERSION;  Surgeon: Jolaine Artist, MD;  Location: [facility] ENDOSCOPY;  Service: Cardiovascular;  Laterality: N/A;  . Cystoscopy with retrograde pyelogram, ureteroscopy and stent placement Left 08/09/2014    Procedure: [place], URETEROSCOPY AND STENT PLACEMENT;  Surgeon: Bernestine Amass, MD;  Location: [facility] ORS;  Service: Urology;  Laterality: Left;  . Cystoscopy with retrograde pyelogram, ureteroscopy and stent placement Left 09/01/2014    Procedure: CYSTOSCOPY WITH URETEROSCOPY AND STENT REMOVAL;  Surgeon: Bernestine Amass, MD;  Location: [facility] ORS;  Service: Urology;  Laterality: Left;  . Holmium laser application Left [id number]    Procedure: HOLMIUM LASER APPLICATION;  Surgeon: Bernestine Amass, MD;  Location: [facility] ORS;  Service: Urology;  Laterality: Left;  . Cystoscopy with retrograde pyelogram, ureteroscopy and stent placement Left 09/20/2014    Procedure: CYSTOSCOPY WITH RETROGRADE PYELOGRAM, URETEROSCOPY AND STENT PLACEMENT, stone removal;  Surgeon: Bernestine Amass, MD;  Location: [facility] ORS;  Service: Urology;  Laterality: Left;  Stephanie Coup ablation N/A 09/12/2011    Procedure: V-TACH ABLATION;  Surgeon: Evans Lance, MD;  Location: [facility] CATH LAB;  Service: Cardiovascular;  Laterality: N/A;  . Left heart catheterization with coronary angiogram N/A 11/24/2012    Procedure: LEFT HEART CATHETERIZATION WITH CORONARY ANGIOGRAM;  Surgeon: Peter M Martinique, MD;  Location: [facility] CATH LAB;  Service: Cardiovascular;  Laterality: N/A;  . Implantable cardioverter defibrillator generator change N/A 05/25/2013    Procedure: IMPLANTABLE CARDIOVERTER DEFIBRILLATOR GENERATOR CHANGE;  Surgeon: Deboraha Sprang, MD;  Location: [facility] CATH LAB;  Service: Cardiovascular;  Laterality: N/A;  . Lead revision N/A 06/05/2013    Procedure: LEAD REVISION;  Surgeon: Thompson Grayer, MD;  Location: [facility] CATH LAB;  Service:  Cardiovascular;  Laterality: N/A;  . V-tach ablation N/A 06/08/2013    Procedure: V-TACH ABLATION;  Surgeon: Evans Lance, MD;  Location: [facility] CATH LAB;  Service: Cardiovascular;  Laterality: N/A;  . Right heart catheterization N/A 09/17/2013    Procedure: RIGHT HEART CATH;  Surgeon: Jolaine Artist, MD;  Location: [facility] CATH LAB;  Service: Cardiovascular;  Laterality: N/A;  . Right heart catheterization N/A 10/14/2013    Procedure: RIGHT HEART CATH;  Surgeon: Jolaine Artist, MD;  Location: [facility] CATH LAB;  Service: Cardiovascular;  Laterality: N/A;  . Right heart catheterization N/A 11/16/2013    Procedure: RIGHT HEART CATH;  Surgeon: Jolaine Artist, MD;  Location: [facility] CATH LAB;  Service: Cardiovascular;  Laterality: N/A;  . Right heart catheterization N/A 07/13/2014    Procedure: RIGHT HEART CATH;  Surgeon: Shaune Pascal Bensimhon,  MD;  Location: [place] CATH LAB;  Service: Cardiovascular;  Laterality: N/A;  . Esophagogastroduodenoscopy N/A 02/15/2015    Procedure: ESOPHAGOGASTRODUODENOSCOPY (EGD);  Surgeon: Carol Ada, MD;  Location: [facility] ENDOSCOPY;  Service: Endoscopy;  Laterality: N/A;  LVAD  . Tee without cardioversion N/A 03/02/2015    Procedure: TRANSESOPHAGEAL ECHOCARDIOGRAM (TEE);  Surgeon: Jolaine Artist, MD;  Location: [facility] ENDOSCOPY;  Service: Cardiovascular;  Laterality: N/A;  . Cardioversion N/A 03/02/2015    Procedure: CARDIOVERSION;  Surgeon: Jolaine Artist, MD;  Location: [facility] ENDOSCOPY;  Service: Cardiovascular;  Laterality: N/A;   Family History  Problem Relation Age of Onset  . Coronary artery disease    . Heart attack Mother   . Coronary artery disease Mother   . Cancer Mother   . Heart disease Mother   . Hyperlipidemia Mother   . Hypertension Mother   . Coronary artery disease Father   . Stroke Father   . Cancer Father   . Heart disease Father     before age 35  . Hyperlipidemia Father   . Hypertension Father   . Heart attack Father    Social History  Substance Use  Topics  . Smoking status: Former Smoker -- 0.50 packs/day for 37 years    Types: Cigarettes    Quit date: 11/05/2001  . Smokeless tobacco: Never Used  . Alcohol Use: 0.0 oz/week     Comment: 05/25/2013 "mixed drink couple times/month"    Review of Systems  Unable to perform ROS: Acuity of condition  Eyes: Positive for itching.      Allergies  Demerol  Home Medications   Prior to Admission medications   Medication Sig Start Date End Date Taking? Authorizing Provider  acetaminophen (TYLENOL) 325 MG tablet Take 650 mg by mouth every 6 (six) hours as needed (pain).   Yes Historical Provider, MD  atorvastatin (LIPITOR) 80 MG tablet Take 80 mg by mouth daily.    Yes Historical Provider, MD  docusate sodium (COLACE) 100 MG capsule Take 200 mg by mouth daily.    Yes Historical Provider, MD  ferrous gluconate (FERGON) 324 MG tablet Take 1 tablet (324 mg total) by mouth 2 (two) times daily with a meal. 06/23/15  Yes Amy D Clegg, NP  levothyroxine (SYNTHROID, LEVOTHROID) 200 MCG tablet Take one 200 mcg daily before breakfast every day with 1 1/2 (300 mcg) every Monday. 10/26/15  Yes Jolaine Artist, MD  Multiple Vitamin (MULTIVITAMIN WITH MINERALS) TABS tablet Take 1 tablet by mouth at bedtime.   Yes Historical Provider, MD  pantoprazole (PROTONIX) 40 MG tablet Take 1 tablet (40 mg total) by mouth 2 (two) times daily. 02/24/15  Yes Amy D Clegg, NP  RANEXA 500 MG 12 hr tablet TAKE 1 TABLET (500 MG TOTAL) BY MOUTH 2 (TWO) TIMES DAILY. 10/17/15  Yes Larey Dresser, MD  warfarin (COUMADIN) 2 MG tablet Take 2-4 mg by mouth daily. Take 4 mg by mouth Monday, Wednesday, and Friday. Take 2 mg by mouth all other days.   Yes Historical Provider, MD  zinc gluconate 50 MG tablet Take 100 mg by mouth at bedtime.    Yes Historical Provider, MD  amLODipine (NORVASC) 10 MG tablet Take 1 tablet (10 mg total) by mouth at bedtime. 11/07/15   Eileen Stanford, PA-C  clopidogrel (PLAVIX) 75 MG tablet Take 1 tablet  (75 mg total) by mouth daily. 11/07/15   Eileen Stanford, PA-C  furosemide (LASIX) 20 MG tablet Take 1 tablet (20 mg total)  by mouth as needed for edema (weight gain). 11/14/15   Shaune Pascal Bensimhon, MD   BP 102/82 mmHg  Pulse 70  Temp(Src) 98 F (36.7 C) (Oral)  Resp 14  Ht 6' (1.829 m)  Wt 186 lb 9.6 oz (84.641 kg)  BMI 25.30 kg/m2  SpO2 90% Physical Exam  Constitutional: He is oriented to person, place, and time. He appears well-developed and well-nourished. No distress.  HENT:  Head: Normocephalic and atraumatic.  Eyes: Pupils are equal, round, and reactive to light.  Neck: Normal range of motion.  Cardiovascular: Normal rate and intact distal pulses.   Pulmonary/Chest: No respiratory distress.  Abdominal: Normal appearance. He exhibits no distension.  Musculoskeletal: Normal range of motion.  Neurological: He is alert and oriented to person, place, and time. No cranial nerve deficit. He exhibits abnormal muscle tone. GCS eye subscore is 4. GCS verbal subscore is 5. GCS motor subscore is 6.  Skin: Skin is warm and dry. No rash noted.  Psychiatric: He has a normal mood and affect. His behavior is normal.  Nursing note and vitals reviewed.   ED Course  Procedures (including critical care time) Labs Review Labs Reviewed  PROTIME-INR - Abnormal; Notable for the following:    Prothrombin Time 25.4 (*)    INR 2.34 (*)    All other components within normal limits  APTT - Abnormal; Notable for the following:    aPTT 44 (*)    All other components within normal limits  CBC - Abnormal; Notable for the following:    RBC 3.45 (*)    Hemoglobin 10.8 (*)    HCT 33.5 (*)    RDW 15.9 (*)    All other components within normal limits  COMPREHENSIVE METABOLIC PANEL - Abnormal; Notable for the following:    Creatinine, Ser 1.55 (*)    Total Protein 6.4 (*)    GFR calc non Af [name] 42 (*)    GFR calc Af [name] 48 (*)    All other components within normal limits  LACTATE DEHYDROGENASE -  Abnormal; Notable for the following:    LDH 266 (*)    All other components within normal limits  LACTATE DEHYDROGENASE - Abnormal; Notable for the following:    LDH 252 (*)    All other components within normal limits  PROTIME-INR - Abnormal; Notable for the following:    Prothrombin Time 24.9 (*)    INR 2.28 (*)    All other components within normal limits  BASIC METABOLIC PANEL - Abnormal; Notable for the following:    Glucose, Bld 112 (*)    Creatinine, Ser 1.28 (*)    GFR calc non Af [name] 53 (*)    All other components within normal limits  CBC WITH DIFFERENTIAL/PLATELET - Abnormal; Notable for the following:    RBC 3.55 (*)    Hemoglobin 10.9 (*)    HCT 34.1 (*)    RDW 15.6 (*)    All other components within normal limits  LIPID PANEL - Abnormal; Notable for the following:    HDL 33 (*)    All other components within normal limits  LACTATE DEHYDROGENASE - Abnormal; Notable for the following:    LDH 236 (*)    All other components within normal limits  PROTIME-INR - Abnormal; Notable for the following:    Prothrombin Time 24.8 (*)    INR 2.27 (*)    All other components within normal limits  BASIC METABOLIC PANEL - Abnormal; Notable for the  following:    Glucose, Bld 114 (*)    Creatinine, Ser 1.34 (*)    GFR calc non Af [name] 50 (*)    GFR calc Af [name] 58 (*)    All other components within normal limits  CBC WITH DIFFERENTIAL/PLATELET - Abnormal; Notable for the following:    RBC 3.52 (*)    Hemoglobin 10.9 (*)    HCT 33.4 (*)    RDW 15.6 (*)    Monocytes Absolute 1.1 (*)    All other components within normal limits  LACTATE DEHYDROGENASE - Abnormal; Notable for the following:    LDH 223 (*)    All other components within normal limits  PROTIME-INR - Abnormal; Notable for the following:    Prothrombin Time 23.7 (*)    INR 2.13 (*)    All other components within normal limits  BASIC METABOLIC PANEL - Abnormal; Notable for the following:    Glucose, Bld 106 (*)     GFR calc non Af [name] 56 (*)    All other components within normal limits  CBC WITH DIFFERENTIAL/PLATELET - Abnormal; Notable for the following:    RBC 3.55 (*)    Hemoglobin 10.8 (*)    HCT 33.6 (*)    All other components within normal limits  CBG MONITORING, ED - Abnormal; Notable for the following:    Glucose-Capillary 100 (*)    All other components within normal limits  I-STAT CHEM 8, ED - Abnormal; Notable for the following:    BUN 24 (*)    Creatinine, Ser 1.50 (*)    Hemoglobin 12.6 (*)    HCT 37.0 (*)    All other components within normal limits  MRSA PCR SCREENING  DIFFERENTIAL  HEMOGLOBIN A1C  TSH  I-STAT TROPOININ, ED    Imaging Review No results found. I have personally reviewed and evaluated these images and lab results as part of my medical decision-making.   EKG Interpretation   Date/Time:  Friday November 04 2015 13:22:34 EST Ventricular Rate:  70 PR Interval:  61 QRS Duration: 131 QT Interval:  460 QTC Calculation: 496 R Axis:   156 Text Interpretation:  Atrial-paced rhythm Nonspecific intraventricular  conduction delay Abnormal lateral Q waves ED PHYSICIAN INTERPRETATION  AVAILABLE IN [facility] Confirmed by TEST, Record ([id number]) on  11/05/2015 8:25:48 AM     LVAD physician and stroke physicians notified MDM   Final diagnoses:  Unilateral vestibular weakness, left  Stroke ([place])  CVA (cerebral infarction)        Leonard Schwartz, MD 11/29/15 [id number]

## 2015-12-01 ENCOUNTER — Ambulatory Visit: Payer: Medicare Other | Admitting: Infectious Diseases

## 2015-12-01 LAB — POCT INR: INR: 3.8

## 2015-12-07 ENCOUNTER — Ambulatory Visit (HOSPITAL_COMMUNITY): Payer: Self-pay | Admitting: Infectious Diseases

## 2015-12-07 ENCOUNTER — Ambulatory Visit (HOSPITAL_COMMUNITY)
Admission: RE | Admit: 2015-12-07 | Discharge: 2015-12-07 | Disposition: A | Discharge status: Home / Self Care | Payer: Medicare Other | Source: Ambulatory Visit | Attending: Internal Medicine | Admitting: Internal Medicine

## 2015-12-07 ENCOUNTER — Other Ambulatory Visit (HOSPITAL_COMMUNITY): Payer: Self-pay | Admitting: Infectious Diseases

## 2015-12-07 DIAGNOSIS — I5022 Chronic systolic (congestive) heart failure: Secondary | ICD-10-CM | POA: Diagnosis not present

## 2015-12-07 DIAGNOSIS — Z7902 Long term (current) use of antithrombotics/antiplatelets: Secondary | ICD-10-CM | POA: Insufficient documentation

## 2015-12-07 DIAGNOSIS — Z8673 Personal history of transient ischemic attack (TIA), and cerebral infarction without residual deficits: Secondary | ICD-10-CM | POA: Diagnosis not present

## 2015-12-07 DIAGNOSIS — E785 Hyperlipidemia, unspecified: Secondary | ICD-10-CM | POA: Diagnosis not present

## 2015-12-07 DIAGNOSIS — G4730 Sleep apnea, unspecified: Secondary | ICD-10-CM | POA: Diagnosis not present

## 2015-12-07 DIAGNOSIS — I255 Ischemic cardiomyopathy: Secondary | ICD-10-CM | POA: Diagnosis not present

## 2015-12-07 DIAGNOSIS — N183 Chronic kidney disease, stage 3 (moderate): Secondary | ICD-10-CM | POA: Insufficient documentation

## 2015-12-07 DIAGNOSIS — I252 Old myocardial infarction: Secondary | ICD-10-CM | POA: Diagnosis not present

## 2015-12-07 DIAGNOSIS — M4856XA Collapsed vertebra, not elsewhere classified, lumbar region, initial encounter for fracture: Secondary | ICD-10-CM | POA: Insufficient documentation

## 2015-12-07 DIAGNOSIS — Z85820 Personal history of malignant melanoma of skin: Secondary | ICD-10-CM | POA: Diagnosis not present

## 2015-12-07 DIAGNOSIS — I130 Hypertensive heart and chronic kidney disease with heart failure and stage 1 through stage 4 chronic kidney disease, or unspecified chronic kidney disease: Secondary | ICD-10-CM | POA: Diagnosis not present

## 2015-12-07 DIAGNOSIS — I739 Peripheral vascular disease, unspecified: Secondary | ICD-10-CM | POA: Diagnosis not present

## 2015-12-07 DIAGNOSIS — I480 Paroxysmal atrial fibrillation: Secondary | ICD-10-CM | POA: Diagnosis not present

## 2015-12-07 DIAGNOSIS — Z955 Presence of coronary angioplasty implant and graft: Secondary | ICD-10-CM | POA: Insufficient documentation

## 2015-12-07 DIAGNOSIS — E039 Hypothyroidism, unspecified: Secondary | ICD-10-CM | POA: Diagnosis not present

## 2015-12-07 DIAGNOSIS — Z95811 Presence of heart assist device: Secondary | ICD-10-CM | POA: Insufficient documentation

## 2015-12-07 DIAGNOSIS — I2510 Atherosclerotic heart disease of native coronary artery without angina pectoris: Secondary | ICD-10-CM | POA: Insufficient documentation

## 2015-12-07 DIAGNOSIS — Z79899 Other long term (current) drug therapy: Secondary | ICD-10-CM | POA: Diagnosis not present

## 2015-12-07 DIAGNOSIS — Z87442 Personal history of urinary calculi: Secondary | ICD-10-CM | POA: Insufficient documentation

## 2015-12-07 DIAGNOSIS — Z7901 Long term (current) use of anticoagulants: Secondary | ICD-10-CM | POA: Diagnosis not present

## 2015-12-07 LAB — PROTIME-INR
INR: 2.54 — ABNORMAL HIGH (ref 0.00–1.49)
Prothrombin Time: 27 seconds — ABNORMAL HIGH (ref 11.6–15.2)

## 2015-12-07 LAB — CBC
HEMATOCRIT: 34.4 % — AB (ref 39.0–52.0)
HEMOGLOBIN: 11.4 g/dL — AB (ref 13.0–17.0)
MCH: 31.1 pg (ref 26.0–34.0)
MCHC: 33.1 g/dL (ref 30.0–36.0)
MCV: 93.7 fL (ref 78.0–100.0)
Platelets: 253 10*3/uL (ref 150–400)
RBC: 3.67 MIL/uL — ABNORMAL LOW (ref 4.22–5.81)
RDW: 15.7 % — AB (ref 11.5–15.5)
WBC: 8 10*3/uL (ref 4.0–10.5)

## 2015-12-07 LAB — HEPATIC FUNCTION PANEL
ALBUMIN: 3.5 g/dL (ref 3.5–5.0)
ALT: 17 U/L (ref 17–63)
AST: 28 U/L (ref 15–41)
Alkaline Phosphatase: 144 U/L — ABNORMAL HIGH (ref 38–126)
Bilirubin, Direct: 0.1 mg/dL (ref 0.1–0.5)
Indirect Bilirubin: 0.5 mg/dL (ref 0.3–0.9)
TOTAL PROTEIN: 7.2 g/dL (ref 6.5–8.1)
Total Bilirubin: 0.6 mg/dL (ref 0.3–1.2)

## 2015-12-07 LAB — BASIC METABOLIC PANEL
ANION GAP: 7 (ref 5–15)
BUN: 19 mg/dL (ref 6–20)
CO2: 26 mmol/L (ref 22–32)
Calcium: 9.3 mg/dL (ref 8.9–10.3)
Chloride: 104 mmol/L (ref 101–111)
Creatinine, Ser: 1.46 mg/dL — ABNORMAL HIGH (ref 0.61–1.24)
GFR calc non Af Amer: 45 mL/min — ABNORMAL LOW (ref >60)
GFR, EST AFRICAN AMERICAN: 52 mL/min — AB (ref >60)
GLUCOSE: 90 mg/dL (ref 65–99)
POTASSIUM: 4.4 mmol/L (ref 3.5–5.1)
Sodium: 137 mmol/L (ref 135–145)

## 2015-12-07 LAB — LACTATE DEHYDROGENASE: LDH: 272 U/L — ABNORMAL HIGH (ref 98–192)

## 2015-12-07 NOTE — Progress Notes (Signed)
Patient ID: [name], male   DOB: 04-08-1941, 75 y.o.   MRN: [id number]   VAD CLINIC PROGRESS NOTE  PCP: Dr. Kandice Robinsons ([place])  [name] is a 75 yo male with h/o VT, PAD and severe systolic HF (EF 0000000) due to mixed ischemic/nonischemic CM he is s/p HM II LVAD implant for destination therapy on October 19, 2013. On 09/01/14 underwent cystoscopy and flexible ureteroscopy with lithotripsy and basket extraction.   VAD implantation was complicated by VT, syncope, A fib/Aflutter and persistent low output felt to be due to RHF. RHC showed low cardiac output but no evidence of RV failure on Milrinone. He was felt to be volume depleted. He was discharged home on 11/18/13 on mirlinone and PRN lasix 20 mg for a weight of 200 pounds or greater. Milrinone has since been weaned off.   RHC (07/13/14): Well compensated hemodynamics with VAD support.  Admitted 11/9-11/19/15 for hematuria and chronic nephrolithiasis. Underwent Echo to assess RV for concern RV failure because he presented with SOB. RV looked good and had renal US showed mild hyronephrosis and bilateral renal cysts. Dr. Risa [name] took back for repeat lithotripsy and stone extraction by ureteroscopy and J stent placement. Received total of 3 PRBCs. D/C weight 188 lbs.   In 4/16 admitted with weakness and fall. In ER hemoglobin was found to be 5.4 with MAP 78.  While hospitalized he received 5U PRBCs. 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 Then readmitted with acute L4 compression fracture. Most recently admitted with weakness and low PI. Found to have recurrent atrial tach and underwent TEE/DCCV on 03/02/15.  He was admitted in 12/16 with CVA causing left-sided weakness.  CT did not show CVA and unable to undergo MRI with device.  There was initially some concern for significant carotid stenosis, but this was reviewed with Dr [name] who did not think that the carotid disease was significant.  He was started on Plavix.   Follow up LVAD:  He returns for followup.  His left-sided weakness has resolved.  Taking Plavix in addition to warfarin.  No melena/BRBPR. Main problem remains low back pain from L-spine compression fracture.  He has seen Dr. Vertell [name] and needs L-spine surgery.but needs 6 week wait after CVA. Back pain limits functional capacity more than HF. No orthopnea or PND. Mild edema. Takes lasix as needed.  ICD interrogated: Volume status up. Back in AF since Jan 15 (tolerates ok). No VT    LVAD interrogation reveals (Reviewed with Dr [name]):  Speed: 9000 Flow: 6.5 - 7.2 Power: 4.4 PI: 5.8 - 7.2 Alarms: few low voltage advisories Events: 0 - 5 PI events several days during the week.  Fixed speed: 9000 Low speed limit: 8400 Primary Controller: Replace back up battery in 29 months  Back up controller: Replace back up battery in 9 months (March 2017)  Labs (10/16): Hgb 12.2 => 12.7, BNP 305, K 4.4, creatinine 1.46, LDL 264 Labs (12/16): LDL 60, HDL 33   Past Medical History  Diagnosis Date  . Bradycardia   . Ventricular tachycardia ([place])     a. s/p AICD;   h/o ICD shock;  Amiodarone Rx. S/P ablation in 2012  . PVD (peripheral vascular disease) ([place])     a. 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  HTN (hypertension)   . HLD (hyperlipidemia)   . Cytopenia   . Hypothyroidism   . CKD (chronic kidney disease)   . Chronic systolic heart failure ([place])     a. s/p LVAD 10/2013 for destination therapy  . Ischemic cardiomyopathy   . CVD (cerebrovascular disease)     a. s/p L CEA 2008  . Stroke [facility])     a. 10/2015 admission   . Inappropriate therapy from implantable cardioverter-defibrillator     a. ATP for sinus tach SVT wavelet identified SVT but was no  passive (2014)  . Automatic implantable cardioverter-defibrillator in situ     a. s/p Medtronic Evera device  serial [id number]    . Melanoma of ear ([place])     a. L Ear   . PAF (paroxysmal atrial fibrillation) ([place])   . Sleep apnea   . CAD (coronary artery disease)     a. s/p MI 1982;  s/p DES to CFX 2011;  s/p NSTEMI 4/12 in setting of VTach;  cath 7/12:  LM ok, LAD mid 30-40%, Dx's with 40%, mCFX stent patent, prox to mid RCA occluded with R to R and L to R collats.  Medical Tx was continued  . History of GI bleed     a. on ASA- started on plavix during 10/2015 admission for CVA    Current Outpatient Prescriptions  Medication Sig Dispense Refill  . acetaminophen (TYLENOL) 325 MG tablet Take 650 mg by mouth every 6 (six) hours as needed (pain).    [name] amLODipine (NORVASC) 10 MG tablet Take 1 tablet (10 mg total) by mouth at bedtime. 30 tablet 6  . atorvastatin (LIPITOR) 80 MG tablet Take 80 mg by mouth daily.     . clopidogrel (PLAVIX) 75 MG tablet Take 1 tablet (75 mg total) by mouth daily. 30 tablet 6  . docusate sodium (COLACE) 100 MG capsule Take 200 mg by mouth daily.     . ferrous gluconate (FERGON) 324 MG tablet Take 1 tablet (324 mg total) by mouth 2 (two) times daily with a meal. 60 tablet 6  . furosemide (LASIX) 20 MG tablet Take 1 tablet (20 mg total) by mouth as needed for edema (weight gain). 30 tablet 3  . levothyroxine (SYNTHROID, LEVOTHROID) 200 MCG tablet Take one 200 mcg daily before breakfast every day with 1 1/2 (300 mcg) every Monday. 45 tablet 6  . Multiple Vitamin (MULTIVITAMIN WITH MINERALS) TABS tablet Take 1 tablet by mouth at bedtime.    . pantoprazole (PROTONIX) 40 MG tablet Take 1 tablet (40 mg total) by mouth 2 (two) times daily. 60 tablet 6  . RANEXA 500 MG 12 hr tablet TAKE 1 TABLET (500 MG TOTAL) BY MOUTH 2 (TWO) TIMES DAILY. 60 tablet 6  . warfarin (COUMADIN) 2 MG tablet Take 2-4 mg by mouth daily. Take 4 mg by mouth Monday, Wednesday, and Friday. Take 2 mg by mouth all other days.    [name] zinc gluconate 50 MG tablet Take 100 mg by mouth at bedtime.      No current  facility-administered medications for this encounter.    Demerol  REVIEW OF SYSTEMS: All systems negative except as listed in HPI, PMH and Problem list.  Vital signs: HR: 65 - 110 irregular on pleth MAP BP: 92 Auto cuff: 103/51 (74) O2 Sat: 88 - 98% today  Wt: 194.8 lbs Last wt: 195.6 lbs  Ht: 6'0"   Physical Exam: GENERAL: NAD HEENT: normal NECK: Supple, JVP 9, no carotid  bruits.  No lymphadenopathy or thyromegaly appreciated.   CARDIAC:  Mechanical heart sounds with LVAD hum present.  LUNGS:  clear.  ABDOMEN:  Soft, round, nontender, positive bowel sounds x4.     LVAD exit site: well-healed and incorporated.  Dressing dry and intact.  No erythema or drainage.  Stabilization device present and accurately applied.  Driveline dressing is being changed weekly per sterile technique. EXTREMITIES:  Warm and dry, no cyanosis, clubbing, no edema.  NEUROLOGIC:  Alert and oriented x 4.  Gait steady.  No aphasia.  No dysarthria.  Affect pleasant. Grossly normal bilateral upper and lower extremity strength.   ASSESSMENT AND PLAN:  1. Chronic systolic HF: s/p LVAD [id number].  Remains NYHA II-III.   - Volume status slightly elevated. Encouraged to take lasix as needed.  2.  Atrial arrhythmias: Atrial fibrillation, atrial tachycardia.  S/p DC-CV 06/04/14 and repeat DC-CV 03/02/15.  Now off amio due to thyrotoxicity. Has recurrent AF but tolerating well.   3. HTN: MAP better on increased amlodipine.  4. LVAD: LVAD parameters reviewed personally, stable. 5. CKD, stage III: BMET today.  6. Anticoagulation management: Goal INR 2-2.5. He is off ASA with GI bleeding.  However, Plavix was started recently with CVA.  No evidence so far for recurrent GI bleeding.   - CBC and INR today.  7. Nephrolithiasis s/p Renal Stone lithotripsy: Per Dr [name]. Stable. 8. 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 Now on Plavix in addition to warfarin.   9. Lumbar compression fracture: This is major limiting  factor for him. I d/w Dr. Vertell [name] and he will see him next week to discuss timing of surgery. He understands there is significant risk but wants to proceed.  10. 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  Continue atorvastatin. Plavix added to regimen (also warfarin INR goal 2-2.5).  Recently reviewed carotid studies with Dr Frank Simmering, do not think that there is surgical disease present.     Glori Bickers MD 12/07/2015

## 2015-12-07 NOTE — Progress Notes (Signed)
Presents today with [name] (significant other).   Symptom  Yes  No  Details   Angina       x Activity:   Claudication        x How far:   Syncope        x When:     Stroke        x        TIA  2001  Orthopnea         x How many pillows: 1  PND         x How often:  CPAP       N/A How many hrs:   Pedal edema        x        R>L baseline + injury  Abd fullness               x    N&V               x  "gagging episodes" resolved with gabapentin  Diaphoresis               x   Bleeding              x   Urine color        yellow  SOB        x      Activity: incline, no more than normal.   Palpitations        x When:   ICD shock        x   Hospitlizaitons                 x When/where/why:   ED visit        x When/where/why:  Other MD              x When/who/why:   Activity        Off work due to injuries  Fluid    No limitations   Diet    Low salt food; Poor appetite   Vital signs: HR: 65 - 110 irregular on pleath MAP BP: 92 Auto cuff: 103/51 (74) O2 Sat: 88 - 98% today  Wt: 194.8 lbs Last wt: 195.6  lbs  Ht: 6'0"  LVAD interrogation reveals (Reviewed with Dr Aundra Dubin):  Speed:   9000 Flow: 6.5 - 7.2 Power: 4.4 PI: 5.8 - 7.2 Alarms: few low voltage advisories Events:  0 - 5 PI events several days during the week.  Fixed speed:  9000 Low speed limit:  8400 Primary Controller:  Replace back up battery in 29 months   Back up controller:   Replace back up battery in 9 months (March 2017)  LVAD interrogated and functioning as expected. Interrogation NEGATIVE for power spikes, and NEGATIVE for significant PI Events or speed drops. Improved on lower speed.   Driveline Site: Drive line site fully healed and incorporated. Velour is fully implanted at the site. Provided dressing supplies for 1 month.   Encounter Details: Reports to clinic for 3 week follow up. Doing 2 Boosts every day with minimal intake in between. Weights are stable around 185 - 188 lbs. Doing 2 boosts a day--asked  about adding a 3rd--discussed that if they would like to make that change to monitor INR at home more frequent to avoid low values with added vitamin K. Interested in appetite stimulant.   HR irregular on SPO2 monitor with rate 65 - 110. POx 88 -  98% depending on rate. Optivol performed and noted to be in AFib since Jan 15th with rate ~95 on average. Patient is asymptomatic and had no idea he was in AFib. D/W Dr. Haroldine Laws in clinic to update about change in rhythm and opted to not correct with DCCV at this time.   Expressed concern over affordability of Ranexa--Tier 4 now with insurance. D/W Doroteo Bradford and have had them fill out patient assistance paperwork. Awaiting W2's/Tax returns from 2015 before we can formally apply for them.   Still weak and with back pain. Appt with Dr. Vertell Limber to discuss surgical plans on 2/15. Will need to D/W team about hold duration on plavix--would favor earlier admission and transition to heparin over holding outpatient blindly with history of CVA and now in AFib.    INR 2.54 --> started Boost drinks BID from QD and interested in adding a third. Goal 2 - 2.5 w/ Plavix 75. See anticoag note.   LDH 272--> stable within established baseline 220 - 300  RTC 3 weeks pending surgery plan for fusion.   Janene Madeira, RN VAD Coordinator   Office: [phone] 24/7 [facility] Pager: [phone]

## 2015-12-07 NOTE — Patient Instructions (Signed)
1. Take a lasix tonight and possibly tomorrow depending on weight.   2. Will call you with bloodwork and let you know what Dr. Vertell Limber says.   3. Come back to see [place] (if not in the hospital for surgery) in 4 weeks.

## 2015-12-08 ENCOUNTER — Ambulatory Visit (HOSPITAL_COMMUNITY): Payer: Medicare Other | Admitting: Infectious Diseases

## 2015-12-08 LAB — POCT INR: INR: 2.9

## 2015-12-12 ENCOUNTER — Ambulatory Visit (INDEPENDENT_AMBULATORY_CARE_PROVIDER_SITE_OTHER): Payer: Medicare Other | Admitting: *Deleted

## 2015-12-12 DIAGNOSIS — I471 Supraventricular tachycardia: Secondary | ICD-10-CM | POA: Diagnosis not present

## 2015-12-13 NOTE — Progress Notes (Signed)
Remote ICD transmission.   

## 2015-12-14 ENCOUNTER — Other Ambulatory Visit: Payer: Self-pay | Admitting: Neurosurgery

## 2015-12-15 ENCOUNTER — Ambulatory Visit (HOSPITAL_COMMUNITY): Payer: Self-pay | Admitting: *Deleted

## 2015-12-15 LAB — POCT INR: INR: 2.4

## 2015-12-17 ENCOUNTER — Other Ambulatory Visit: Payer: Self-pay | Admitting: Physician Assistant

## 2015-12-22 ENCOUNTER — Ambulatory Visit (HOSPITAL_COMMUNITY): Payer: Self-pay | Admitting: Infectious Diseases

## 2015-12-22 LAB — POCT INR: INR: 3

## 2015-12-26 ENCOUNTER — Ambulatory Visit: Payer: Self-pay | Admitting: *Deleted

## 2015-12-26 LAB — POCT INR: INR: 2.2

## 2015-12-27 ENCOUNTER — Encounter (HOSPITAL_COMMUNITY): Payer: Self-pay

## 2015-12-27 NOTE — Progress Notes (Signed)
[facility] at [address] [place], [place]

## 2015-12-29 ENCOUNTER — Ambulatory Visit (HOSPITAL_COMMUNITY)
Admission: RE | Admit: 2015-12-29 | Discharge: 2015-12-29 | Disposition: A | Discharge status: Home / Self Care | Payer: Medicare Other | Source: Ambulatory Visit | Attending: Internal Medicine | Admitting: Internal Medicine

## 2015-12-29 ENCOUNTER — Encounter (HOSPITAL_COMMUNITY): Payer: Self-pay

## 2015-12-29 ENCOUNTER — Ambulatory Visit (HOSPITAL_COMMUNITY): Payer: Self-pay | Admitting: Infectious Diseases

## 2015-12-29 ENCOUNTER — Encounter (HOSPITAL_COMMUNITY)
Admission: RE | Admit: 2015-12-29 | Discharge: 2015-12-29 | Disposition: A | Discharge status: Home / Self Care | Payer: Medicare Other | Source: Ambulatory Visit | Attending: Neurosurgery | Admitting: Neurosurgery

## 2015-12-29 ENCOUNTER — Other Ambulatory Visit (HOSPITAL_COMMUNITY): Payer: Self-pay | Admitting: Infectious Diseases

## 2015-12-29 ENCOUNTER — Telehealth (HOSPITAL_COMMUNITY): Payer: Self-pay | Admitting: Infectious Diseases

## 2015-12-29 ENCOUNTER — Encounter: Payer: Self-pay | Admitting: Licensed Clinical Social Worker

## 2015-12-29 DIAGNOSIS — I5022 Chronic systolic (congestive) heart failure: Secondary | ICD-10-CM | POA: Diagnosis not present

## 2015-12-29 DIAGNOSIS — I4892 Unspecified atrial flutter: Secondary | ICD-10-CM

## 2015-12-29 DIAGNOSIS — Z01812 Encounter for preprocedural laboratory examination: Secondary | ICD-10-CM | POA: Insufficient documentation

## 2015-12-29 DIAGNOSIS — I252 Old myocardial infarction: Secondary | ICD-10-CM | POA: Insufficient documentation

## 2015-12-29 DIAGNOSIS — M4856XA Collapsed vertebra, not elsewhere classified, lumbar region, initial encounter for fracture: Secondary | ICD-10-CM | POA: Diagnosis not present

## 2015-12-29 DIAGNOSIS — Z01818 Encounter for other preprocedural examination: Secondary | ICD-10-CM | POA: Insufficient documentation

## 2015-12-29 DIAGNOSIS — Z95828 Presence of other vascular implants and grafts: Secondary | ICD-10-CM | POA: Insufficient documentation

## 2015-12-29 DIAGNOSIS — Z95811 Presence of heart assist device: Secondary | ICD-10-CM | POA: Diagnosis not present

## 2015-12-29 DIAGNOSIS — I480 Paroxysmal atrial fibrillation: Secondary | ICD-10-CM | POA: Insufficient documentation

## 2015-12-29 DIAGNOSIS — Z79899 Other long term (current) drug therapy: Secondary | ICD-10-CM | POA: Diagnosis not present

## 2015-12-29 DIAGNOSIS — N183 Chronic kidney disease, stage 3 (moderate): Secondary | ICD-10-CM | POA: Insufficient documentation

## 2015-12-29 DIAGNOSIS — I130 Hypertensive heart and chronic kidney disease with heart failure and stage 1 through stage 4 chronic kidney disease, or unspecified chronic kidney disease: Secondary | ICD-10-CM | POA: Insufficient documentation

## 2015-12-29 DIAGNOSIS — Z7901 Long term (current) use of anticoagulants: Secondary | ICD-10-CM | POA: Insufficient documentation

## 2015-12-29 DIAGNOSIS — I2510 Atherosclerotic heart disease of native coronary artery without angina pectoris: Secondary | ICD-10-CM | POA: Insufficient documentation

## 2015-12-29 DIAGNOSIS — Z8673 Personal history of transient ischemic attack (TIA), and cerebral infarction without residual deficits: Secondary | ICD-10-CM | POA: Insufficient documentation

## 2015-12-29 DIAGNOSIS — I4819 Other persistent atrial fibrillation: Secondary | ICD-10-CM

## 2015-12-29 DIAGNOSIS — Z7902 Long term (current) use of antithrombotics/antiplatelets: Secondary | ICD-10-CM | POA: Insufficient documentation

## 2015-12-29 DIAGNOSIS — Z87891 Personal history of nicotine dependence: Secondary | ICD-10-CM | POA: Insufficient documentation

## 2015-12-29 HISTORY — DX: Reserved for inherently not codable concepts without codable children: IMO0001

## 2015-12-29 LAB — BASIC METABOLIC PANEL
ANION GAP: 9 (ref 5–15)
BUN: 18 mg/dL (ref 6–20)
CALCIUM: 9.3 mg/dL (ref 8.9–10.3)
CO2: 25 mmol/L (ref 22–32)
CREATININE: 1.56 mg/dL — AB (ref 0.61–1.24)
Chloride: 108 mmol/L (ref 101–111)
GFR calc Af Amer: 48 mL/min — ABNORMAL LOW (ref >60)
GFR, EST NON AFRICAN AMERICAN: 41 mL/min — AB (ref >60)
GLUCOSE: 104 mg/dL — AB (ref 65–99)
Potassium: 4.4 mmol/L (ref 3.5–5.1)
Sodium: 142 mmol/L (ref 135–145)

## 2015-12-29 LAB — CBC
HCT: 35.2 % — ABNORMAL LOW (ref 39.0–52.0)
Hemoglobin: 11 g/dL — ABNORMAL LOW (ref 13.0–17.0)
MCH: 29.1 pg (ref 26.0–34.0)
MCHC: 31.3 g/dL (ref 30.0–36.0)
MCV: 93.1 fL (ref 78.0–100.0)
PLATELETS: 210 10*3/uL (ref 150–400)
RBC: 3.78 MIL/uL — ABNORMAL LOW (ref 4.22–5.81)
RDW: 17 % — AB (ref 11.5–15.5)
WBC: 6.4 10*3/uL (ref 4.0–10.5)

## 2015-12-29 LAB — TYPE AND SCREEN
ABO/RH(D): O POS
Antibody Screen: NEGATIVE

## 2015-12-29 LAB — PROTIME-INR
INR: 2.28 — AB (ref 0.00–1.49)
Prothrombin Time: 24.9 seconds — ABNORMAL HIGH (ref 11.6–15.2)

## 2015-12-29 LAB — APTT: APTT: 48 s — AB (ref 24–37)

## 2015-12-29 LAB — SURGICAL PCR SCREEN
MRSA, PCR: NEGATIVE
STAPHYLOCOCCUS AUREUS: POSITIVE — AB

## 2015-12-29 MED ORDER — METOPROLOL SUCCINATE ER 25 MG PO TB24: 25.0000 mg | ORAL_TABLET | Freq: Two times a day (BID) | ORAL | Status: DC

## 2015-12-29 NOTE — Telephone Encounter (Signed)
Call received re: Iona Beard HR being 136 in pre-admissions today. Last clinic visit was noted to be in atrial fib but with controlled rate then but consistently since January 2017 in rhythm.   PIs reported to be 3.9 which is low for him. Will have them come here after pre-admission testing to check with upcoming admission for surgery next week. Dr. Haroldine Laws updated.   Janene Madeira, RN VAD Coordinator   Office: [phone] 24/7 VAD Pager: [phone]

## 2015-12-29 NOTE — Pre-Procedure Instructions (Signed)
[name]  12/29/2015      [id number] - [address], [place] - [address] AT NEXT TO [facility] [address] [place] [address] Phone: [phone] Fax: [phone]  [place], [place] [address] Phone: [phone] Fax: [phone]    Your procedure is scheduled on   Friday  01/06/16  Report to [facility] Admitting at 715 A.M.  Call this number if you have problems the morning of surgery:  [phone]   Remember:  Do not eat food or drink liquids after midnight.  Take these medicines the morning of surgery with A SIP OF WATER    AMLODIPINE (NORVASC), LEVOTHYROXINE, PANTOPRAZOLE, RANEXA   (STOP ASPIRIN, COUMADIN AS DIRECTED , PLAVIX, EFFIENT, HERBAL MEDICINES, VITAMINS)   Do not wear jewelry, make-up or nail polish.  Do not wear lotions, powders, or perfumes.  You may wear deodorant.  Do not shave 48 hours prior to surgery.  Men may shave face and neck.  Do not bring valuables to the hospital.  [facility] is not responsible for any belongings or valuables.  Contacts, dentures or bridgework may not be worn into surgery.  Leave your suitcase in the car.  After surgery it may be brought to your room.  For patients admitted to the hospital, discharge time will be determined by your treatment team.  Patients discharged the day of surgery will not be allowed to drive home.   Name and phone number of your driver:    Special instructions:  [facility] - Preparing for Surgery  Before surgery, you can play an important role.  Because skin is not sterile, your skin needs to be as free of germs as possible.  You can reduce the number of germs on you skin by washing with CHG (chlorahexidine gluconate) soap before surgery.  CHG is an antiseptic cleaner which kills germs and bonds with the skin to continue killing germs even after  washing.  Please DO NOT use if you have an allergy to CHG or antibacterial soaps.  If your skin becomes reddened/irritated stop using the CHG and inform your nurse when you arrive at Short Stay.  Do not shave (including legs and underarms) for at least 48 hours prior to the first CHG shower.  You may shave your face.  Please follow these instructions carefully:   1.  Shower with CHG Soap the night before surgery and the                                morning of Surgery.  2.  If you choose to wash your hair, wash your hair first as usual with your       normal shampoo.  3.  After you shampoo, rinse your hair and body thoroughly to remove the                      Shampoo.  4.  Use CHG as you would any other liquid soap.  You can apply chg directly       to the skin and wash gently with scrungie or a clean washcloth.  5.  Apply the CHG Soap to your body ONLY FROM THE NECK DOWN.        Do not use on open wounds or open sores.  Avoid contact with your eyes,       ears, mouth and genitals (private parts).  Wash genitals (private parts)       with your normal soap.  6.  Wash thoroughly, paying special attention to the area where your surgery        will be performed.  7.  Thoroughly rinse your body with warm water from the neck down.  8.  DO NOT shower/wash with your normal soap after using and rinsing off       the CHG Soap.  9.  Pat yourself dry with a clean towel.            10.  Wear clean pajamas.            11.  Place clean sheets on your bed the night of your first shower and do not        sleep with pets.  Day of Surgery  Do not apply any lotions/deoderants the morning of surgery.  Please wear clean clothes to the hospital/surgery center.    Please read over the following fact sheets that you were given. Pain Booklet, Coughing and Deep Breathing, Blood Transfusion Information, MRSA Information and Surgical Site Infection Prevention

## 2015-12-29 NOTE — Progress Notes (Signed)
Acute LVAD Visit  Vital signs: HR: 138 - 144 bpm afib/flutter MAP BP: 90 Auto cuff: 114/77 (88) O2 Sat: 94%    Ht: 6'0"  LVAD interrogation reveals (Reviewed with Dr Aundra Dubin):  Speed:   9000 Flow: 5.2 Power: 4.4 PI: 4.4 - 4.9 Alarms: few low voltage advisories Events: rare events  Fixed speed:  9000 Low speed limit:  8400 Primary Controller:  Replace back up battery in 29 months   Back up controller:   Replace back up battery in 7 months (March 2017)  LVAD interrogated and functioning as expected. Interrogation NEGATIVE for power spikes, and NEGATIVE for significant PI Events or speed drops. Improved on lower speed.   Driveline Site: Drive line site fully healed and incorporated. Velour is fully implanted at the site.  Encounter Details: [name] was in pre-op testing today and found HR to be 138 bpm. At his previous clinic visit he was found to be in afib with controlled rate. He reports he is "unaffected and didn't know he was in this rhythm". However of note patient does not participate in physical activity and primarily rests at home d/t limitations from back pain.   Medtronic device interrogated by [name] today showing steadily where his HR has escalated over the last 4 - 6w to where he is consistently in SVT/AFib/AFlutter in the 130 - 140 range. Optivol with increasing fluid not quite beyond threshold yet.   He was previously on Amiodarone for years but stopped d/t thyrotoxicity within the last 2 - 3 months. D/W Dr. Haroldine Laws in clinic--[name] is adamant that he does not want to consider DCCV and post-pone back surgery again d/t prolonged need for anticoagulation. At this time the only other thing to attempt is rate control. Started on Toprol X 25 mg BID per Dr. Haroldine Laws with hopes to titrate upward quickly. Will call back Monday to assess tolerance of medication and titrate up at that point. Patient had history of RVF post-LVAD implant to consider as well regarding future  monitoring.   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 Jacinto Reap  Labs reveiwed from pre-admission testing. 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   Hgb: 11.0, stable  INR 2.28 --> Goal 2 - 2.5 w/ Plavix 75 mg QD (Hx TIA Dec 2016, GIB April 2016).   Creatinine 1.56 today--baseline 1.3 - 1.5 since Dec 2016.    As scheduled will continue with surgical plan for coumadin/plavix and planned admission on Tuesday 01/03/16 per his request.   Frank Madeira, RN [place] Coordinator   Office: [phone] 24/7 [place] Pager: [phone]

## 2015-12-29 NOTE — Progress Notes (Signed)
CSW met with patient and SO in the clinic. Patient and SO completed a new Durable Power of Attorney and requested assistance with obtaining a notary. CSW attempted to reach a notary to assist while patient in the clinic although unsuccessful.  CSW will explore options for patient to obtain notary. [name], [place]

## 2015-12-29 NOTE — Progress Notes (Addendum)
FAXED ICD FORM TO DEVICE CLINIC.   [place], MADE AWARE OF PATIENT SURGERY 01/06/16 @0920 .  PATIENT STATED HE WILL BE ADMITTED BY CARDIOLOGY 01/03/16 AND STEPHANIE IS AWARE W/ LVAD CLINIC.  PATIENTSTATED HIS LAST DOSE OF PLAVIX IS 12/29/15, LAST DOSE COUMADIN 01/01/16.

## 2015-12-29 NOTE — Patient Instructions (Addendum)
1. Take an extra dose of lasix today and tomorrow.   2. Start Toprol XL 25 mg twice a day.

## 2015-12-29 NOTE — Progress Notes (Addendum)
Anesthesia Note: Patient is a 75 year old male with an LVAD who is scheduled for L3-5 fusion (no interbodies) on 01/06/16 by Dr. Vertell Limber. OR room is booked for 0920-1305. Surgery was initially scheduled for 11/21/15, but was postponed due to recent CVA on 11/04/15. (On 11/15/15, cardiologist Dr. Aundra Dubin recommended surgery be put off at least another month.). Dr. Haroldine Laws is having patient hold warfarin 01/01/16 with plans to admit for IV heparin on 01/03/16. Last Plavix dose 12/29/15.  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 Risa Grill).  - PCP is listed as Dr. Bary Castilla. - He sees cardiologist Dr. Haroldine Laws and Dr. Aundra Dubin at [facility]. Dr. Clayborne Dana 12/07/15 note states indicates that he spoke with Dr. Vertell Limber. Patient's lumbar compression fracture is a "major" limiting factor for him and, "He understands there is significant risk but wants to proceed."  - EP Cardiologist is Dr. Caryl Comes. - Neurologist is Dr. Rosalin Hawking. According to Dr. Phoebe Sharps last hospital note on 11/06/15, patient was felt to have suffered a right hemispheric CVA, likely related to right CCA/ICA stenosis, although embolic CVA could not be completely rules out due to LVAD, cardiomyopathy with EF 5-10%, but INR was therapeutic.  - Vascular surgeon is Dr. Tinnie Gens. According to cardiology notes, Dr. Kellie Simmering "did not think that the carotid disease was significant." Dr. Kellie Simmering canceled patient's  11/2015 office visit with scheduled follow-up for 04/2016. Since the last vascular note in Epic is from Dr. Bridgett Larsson mentioning consideration of right carotid angiogram, I will ask Dr. Kellie Simmering to confirm that no carotid intervention is needed prior to planned lumbar surgery.  Meds include amlodipine, Lipitor, Plavix, ferritin on, Lasix, Synthroid, Toprol-XL, nitroglycerin, Protonix, promethazine, Ranexa, warfarin.  11/04/15 EKG: Atrial paced rhythm. Non-specific IVCD, inferolateral Q waves.  11/05/15 Echo: Study Conclusions - Left ventricle: The cavity size was severely dilated. Systolic function was severely reduced. The estimated ejection fraction was in the range of 5-10%. Diffuse hypokinesis. The study is not technically sufficient to allow evaluation of LV diastolic function. - Aortic valve: Aortic valve opens minimally. There was no regurgitation. - Mitral valve: There was no regurgitation. - Left atrium: The atrium was moderately dilated. - Right ventricle: The cavity size was moderately dilated. Wall thickness was normal. Systolic function was severely reduced. - Right atrium: Pacer wire or catheter noted in right atrium. - Tricuspid valve: There was trivial regurgitation. Impressions: - This was a limited study. LVAD Inflow and outflow cannulas are not visualized. No obvious intracardiac source of embolism was seen.  07/13/14 RHC: FINDINGS: RA = 7 RV = 25/4/8 PA = 27/15 (19) PCW = 8 Fick cardiac output/index = 6.4/3.0 Thermo CO/CI = 6.1/2.9 PVR = 1.7 WU FA sat = 90% PA sat = 60%, 61% Assessment: Well compensated hemodynamics with VAD support.  11/24/12 LHC:  Coronary angiography: Coronary dominance: right - Left mainstem: Left main coronary has the centric heavy calcification on the superior portion of the vessel. There is mild ostial disease up to  40%. - Left anterior descending (LAD): The left anterior descending artery is a large vessel which is moderately  calcified in the proximal vessel. There is 40% proximal disease. The mid vessel has scattered irregularities less than 20%. - Left circumflex (LCx): The left circumflex coronary gives rise to 2 marginal branches. There is a stent in the mid circumflex. It is widely patent. There is mild disease throughout up to 20-30%. - Right coronary artery (RCA): The right coronary arises and distributes normally. It is heavily calcified at the ostium. The proximal vessel there is a long segment of disease up to 95%. The distal vessel is small. It also fills by left to right collaterals. - Left ventriculography: Not performed Final Conclusions: Single vessel obstructive coronary disease with chronically occluded right coronary which has recanalized. The stent in the left circumflex is still widely patent. Recommendations: Continue medical management.   11/06/15 Carotid U/S: Summary: Right: there is significant calcific plaque noted distal CCA/origin ICA. Velocities are elevated at the site, however, secondary to LVAD status, unable to truly grade stenosis. Left: CEA is patent. Bilateral vertebral artery flow is antegrade.  11/04/15 CT head w/o contrast: IMPRESSION: Mild chronic ischemic white matter disease. Moderate diffuse cortical atrophy. No acute intracranial abnormality seen.  11/05/15 CTA head/neck: IMPRESSION: - No flow reducing intracranial or extracranial atherosclerotic lesion. - No findings of acute cerebral infarction on the basis of CT/CTA. A small subcortical acute infarct could be difficult to visualize, in the setting of chronic small vessel disease. - Severe COPD. (Per vascular surgeon Dr. Lianne Moris review of the CTA, "R carotid lesion correlated to ~65% stenosis by NASCET methodology. I have found CTA to be sometimes too aggressive in diagnosis of lesions, though better than MRA, so if cardiology would consider bridge therapy for a carotid endarterectomy or CAS, I would recommend a R carotid  angiogram to determine if he needs a R CEA. The calcific rim on the carotid and atherosclerotic aortic arch may make CAS a less desirable option. I usually reserve CEA for sx carotid lesion for the >70% threshold. This is more conservative than NASCET, but in this LVAD dependent patient, I think this might be a better approach. This may not be Dr. Evelena Leyden criteria; however, so I would hold off on any significant changes in regard to the anticoagulation until he weighs in.")  09/21/15 CXR: IMPRESSION: Small left pleural effusion again noted. Mild vascular congestion, with slightly increased interstitial markings, raising question for minimal interstitial edema.  09/18/13 PFTs: FVC 4.33 (93%), FEV1 2.92 (86%), DLCOunc 11.64 (33%).   Preoperative labs noted. Cr 1.56 (previously 1.22-1.55 since 09/2015), glucose 104. H/H 11.0/35.2. PT/INR 24.9/2.28, PTT 48. T&S done.   Colletta Maryland with the [facility] was made aware of patient's surgery date and time. Of note, patient was tachycardic at his PAT visit (136 bpm). He denied CV symptoms. Reported he was told he had recently been having runs of afib. The LVAD clinic staff was notified and patient to be evaluated at their office as soon as he leaves PAT. Patient reports he feels well from a CV standpoint and also states he has no residual symptoms from his 11/04/15 CVA. He ambulates with a cane or uses a wheelchair due to his significant back pain.  I discussed above with anesthesiologist Dr. Therisa Doyne. He agreed with clarifying plan for management of patient's carotid stenosis (spoke with VVS RN Ulis Rias, Dr. Kellie Simmering is out of the office until 01/02/16), and updating anesthesiologist Dr. Smith Robert who has a  high likelihood of being in the Neuro OR on 01/06/16.   I will follow-up next week once I hear back from vascular surgery.  Maxemiliano Hugh [facility] Short Stay Center/Anesthesiology Phone [phone] 12/29/2015 5:15 PM  Addendum: Per VVS RN Colletta Maryland,  Dr. Kellie Simmering is recommending proceeding with right CEA prior to patient undergoing lumbar fusion. Tentatively, Dr. Kellie Simmering is looking at 01/13/16, although case has not yet been posted. Of note, I did update her that patient was started on Toprol last week for poorly controlled afib. Colletta Maryland has been in communication with Zada Girt, RN at the [facility] regarding change in surgery plans and need for them to arrange anti-coagulation instruction and admission for pre-operative heparin bridge. She also notified Dr. Melven Sartorius office and will contact the patient. There was question regarding whether Dr. Vertell Limber could perform the lumbar fusion during the same admission, but scheduling currently reflects that his fusion is being moved to 02/03/16. Dr. Smith Robert did speak with Dr. Haroldine Laws this morning. Reportedly, he stated that in general IV heparin could be held 2-3 hours prior to surgery and resumed post-operatively and also that the LVAD RN present at the time of surgery would assist with patient positioning and outflow monitoring.   Seyed Hugh [facility] Short Stay Center/Anesthesiology Phone [phone] 01/02/2016 2:13 PM

## 2015-12-30 ENCOUNTER — Telehealth: Payer: Self-pay | Admitting: *Deleted

## 2015-12-30 NOTE — Telephone Encounter (Signed)
-----   Message from Jacinta Shoe, PA-C sent at 12/29/2015  5:23 PM EST ----- Regarding: carotid disease Dr. Kellie Simmering, Hi! [name] is scheduled for L3-5 fusion with Dr. Vertell Limber on 01/06/16. He has ischemic CM s/p ICD and LVAD, known PAD, and s/p L CEA. On 11/04/15 he was hospitalized for a CVA. Neurology felt likely from right carotid stenosis. His INR was therapeutic. Dr. Bridgett Larsson saw him in your absence. His note states: "R carotid lesion correlated to ~65% stenosis by NASCET methodology. I have found CTA to be sometimes too aggressive in diagnosis of lesions, though better than MRA, so if cardiology would consider bridge therapy for a carotid endarterectomy or CAS, I would recommend a R carotid angiogram to determine if he needs a R CEA. The calcific rim on the carotid and atherosclerotic aortic arch may make CAS a less desirable option. I usually reserve CEA for sx carotid lesion for the >70% threshold. This is more conservative than NASCET, but in this LVAD dependent patient, I think this might be a better approach. This may not be Dr. Evelena Leyden criteria; however, so I would hold off on any significant changes in regard to the anticoagulation until he weighs in."  You apparently canceled his f/u appointment with you (until 04/2016) and according to patient and Dr. Clayborne Dana note's you did not feel that there was significant disease to warrant carotid intervention. Anesthesiologist Dr. Therisa Doyne wanted me to confirm that you would not recommend further testing or intervention for his carotid disease prior to this surgery. His Plavix is on hold starting today. Warfarin is on hold starting 01/01/16 with plans for him to be admitted for heparin bridge on 01/03/16.   Please let me know if any thing else is needed from your standpoint prior to lumbar fusion.  Thanks!  Andrewjames Hugh [facility] Short Stay Center/Anesthesiology Phone [phone] 12/29/2015 5:30 PM

## 2015-12-30 NOTE — Telephone Encounter (Signed)
Spoke to [place] about this patient; Dr. Kellie Simmering will be in the office on Monday 01-02-16. I will make sure she gets an answer back on this.

## 2016-01-01 NOTE — Progress Notes (Signed)
Patient ID: [name], male   DOB: 04-12-1941, 75 y.o.   MRN: [id number]    VAD CLINIC PROGRESS NOTE  PCP: Dr. Kandice [name] ([place])  [name] is a 75 yo male with h/o VT, PAD and severe systolic HF (EF 0000000) due to mixed ischemic/nonischemic CM he is s/p HM II LVAD implant for destination therapy on October 19, 2013. On 09/01/14 underwent cystoscopy and flexible ureteroscopy with lithotripsy and basket extraction.   VAD implantation was complicated by VT, syncope, A fib/Aflutter and persistent low output felt to be due to RHF. RHC showed low cardiac output but no evidence of RV failure on Milrinone. He was felt to be volume depleted. He was discharged home on 11/18/13 on mirlinone and PRN lasix 20 mg for a weight of 200 pounds or greater. Milrinone has since been weaned off.   RHC (07/13/14): Well compensated hemodynamics with VAD support.  Admitted 11/9-11/19/15 for hematuria and chronic nephrolithiasis. Underwent Echo to assess RV for concern RV failure because he presented with SOB. RV looked good and had renal US showed mild hyronephrosis and bilateral renal cysts. Dr. Risa [name] took back for repeat lithotripsy and stone extraction by ureteroscopy and J stent placement. Received total of 3 PRBCs. D/C weight 188 lbs.   In 4/16 admitted with weakness and fall. In ER hemoglobin was found to be 5.4 with MAP 78.  While hospitalized he received 5U PRBCs. 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 Then readmitted with acute L4 compression fracture. Most recently admitted with weakness and low PI. Found to have recurrent atrial tach and underwent TEE/DCCV on 03/02/15.  He was admitted in 12/16 with CVA causing left-sided weakness.  CT did not show CVA and unable to undergo MRI with device.  There was initially some concern for significant carotid stenosis, but this was reviewed with Dr [name] who did not think that the carotid disease was significant. He  was started on Plavix.   Follow up LVAD:  Presents today for an acute visit. Was at pre-op for his back surgery and was found to be tachycardic with HRs in 130-140s. ECG shows recurrent AF (has been off amio due to thyroid toxicity). He denies palpitations, SOB or CP. Only real complaint is ongoing back pain. + edema. He is scheduled for his back surgery next week.   ICD interrogated: Volume status up. Back in AF since Jan 15 but rate up to 130-140s for past 1-2 weeks. No VT    LVAD interrogation reveals (Reviewed with Dr [name]):  Speed: 9000 Flow: 5.2 Power: 4.4 PI: 4.4 - 4.9 Alarms: few low voltage advisories Events: rare events  Fixed speed: 9000 Low speed limit: 8400 Primary Controller: Replace back up battery in 29 months  Back up controller: Replace back up battery in 7 months (March 2017)Labs (10/16): Hgb 12.2 => 12.7, BNP 305, K 4.4, creatinine 1.46, LDL 264 Labs (12/16): LDL 60, HDL 33   Past Medical History  Diagnosis Date  . Bradycardia   . Ventricular tachycardia ([place])     a. s/p AICD;   h/o ICD shock;  Amiodarone Rx. S/P ablation in 2012  . PVD (peripheral vascular disease) ([place])     a. 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 HTN (hypertension)   . HLD (hyperlipidemia)   .  Cytopenia   . Hypothyroidism   . CKD (chronic kidney disease)   . Chronic systolic heart failure ([place])     a. s/p LVAD 10/2013 for destination therapy  . Ischemic cardiomyopathy   . CVD (cerebrovascular disease)     a. s/p L CEA 2008  . Stroke [name])     a. 10/2015 admission   . Inappropriate therapy from implantable cardioverter-defibrillator     a. ATP for sinus tach SVT wavelet identified SVT but was no  passive (2014)  . Automatic implantable cardioverter-defibrillator in situ     a. s/p Medtronic Evera device serial [id number]    . Melanoma of ear ([place])     a. L Ear   . PAF  (paroxysmal atrial fibrillation) ([place])   . CAD (coronary artery disease)     a. s/p MI 1982;  s/p DES to CFX 2011;  s/p NSTEMI 4/12 in setting of VTach;  cath 7/12:  LM ok, LAD mid 30-40%, Dx's with 40%, mCFX stent patent, prox to mid RCA occluded with R to R and L to R collats.  Medical Tx was continued  . History of GI bleed     a. on ASA- started on plavix during 10/2015 admission for CVA  . Myocardial infarction ([place])     1992  . Dysrhythmia     AFIB  . Sleep apnea     NO CPAP NOW  HE SENT IT BACK YRS AGO  . Shortness of breath dyspnea     W/ EXERTION   . Arthritis     Current Outpatient Prescriptions  Medication Sig Dispense Refill  . amLODipine (NORVASC) 10 MG tablet Take 1 tablet (10 mg total) by mouth at bedtime. 30 tablet 6  . atorvastatin (LIPITOR) 80 MG tablet Take 80 mg by mouth daily.     . clopidogrel (PLAVIX) 75 MG tablet Take 1 tablet (75 mg total) by mouth daily. 30 tablet 6  . docusate sodium (COLACE) 100 MG capsule Take 100 mg by mouth daily as needed for mild constipation.     . ferrous gluconate (FERGON) 324 MG tablet Take 1 tablet (324 mg total) by mouth 2 (two) times daily with a meal. 60 tablet 6  . furosemide (LASIX) 20 MG tablet Take 1 tablet (20 mg total) by mouth as needed for edema (weight gain). 30 tablet 3  . lactose free nutrition (BOOST) LIQD Take 237 mLs by mouth 2 (two) times daily.    [name] levothyroxine (SYNTHROID, LEVOTHROID) 200 MCG tablet Take one 200 mcg daily before breakfast every day with 1 1/2 (300 mcg) every Monday. (Patient taking differently: Take 200-300 mcg by mouth daily before breakfast. Take 1 tablet (266mcg) all days except on Mondays take 1&1/2 tablets (363mcg)) 45 tablet 6  . metoprolol succinate (TOPROL-XL) 25 MG 24 hr tablet Take 1 tablet (25 mg total) by mouth 2 (two) times daily. 60 tablet 3  . Multiple Vitamin (MULTIVITAMIN WITH MINERALS) TABS tablet Take 1 tablet by mouth at bedtime.    . Multiple Vitamins-Minerals (ICAPS PO) Take  1 tablet by mouth 2 (two) times daily.    . nitroGLYCERIN (NITROSTAT) 0.4 MG SL tablet Place 0.4 mg under the tongue every 5 (five) minutes as needed for chest pain.    . pantoprazole (PROTONIX) 40 MG tablet TAKE 1 TABLET EVERY 12 HOURS. 60 tablet 3  . promethazine (PHENERGAN) 12.5 MG tablet Take 12.5 mg by mouth every 6 (six) hours as needed for nausea or vomiting.    [name]  RANEXA 500 MG 12 hr tablet TAKE 1 TABLET (500 MG TOTAL) BY MOUTH 2 (TWO) TIMES DAILY. 60 tablet 6  . warfarin (COUMADIN) 2 MG tablet Take 2-4 mg by mouth daily. Take 1 tablet (2mg ) all days except Monday take 2 tablets (4mg )    . zinc gluconate 50 MG tablet Take 50 mg by mouth 2 (two) times daily.      No current facility-administered medications for this encounter.    Demerol  REVIEW OF SYSTEMS: All systems negative except as listed in HPI, PMH and Problem list.  Vital signs: HR: 138 - 144 bpm afib/flutter MAP BP: 90 Auto cuff: 114/77 (88) O2 Sat: 94%  Ht: 6'0"  Physical Exam: GENERAL: NAD HEENT: normal NECK: Supple, JVP jaw, no carotid bruits.  No lymphadenopathy or thyromegaly appreciated.   CARDIAC:  Mechanical heart sounds with LVAD hum present.  LUNGS:  clear.  ABDOMEN:  Soft, round, nontender, positive bowel sounds x4.     LVAD exit site: well-healed and incorporated.  Dressing dry and intact.  No erythema or drainage.  Stabilization device present and accurately applied.  Driveline dressing is being changed weekly per sterile technique. EXTREMITIES:  Warm and dry, no cyanosis, clubbing, 1+ edema.  NEUROLOGIC:  Alert and oriented x 4.  Gait steady.  No aphasia.  No dysarthria.  Affect pleasant. Grossly normal bilateral upper and lower extremity strength.   ASSESSMENT AND PLAN:  1. Chronic systolic HF: s/p LVAD [id number].  Remains NYHA II-III.   - Volume status elevated in setting of AF with RVR. Encouraged to take lasix for 2 days and then continue as needed.  2.  Atrial arrhythmias: Atrial fibrillation,  atrial tachycardia.  S/p DC-CV 06/04/14 and repeat DC-CV 03/02/15.  Now off amio due to thyrotoxicity.  - Has had recurrent AF for 2 months but now with RVR. Tolerating surprisingly well. Cannot use amio due to thyrotoxicity. Unable to DC-CV due to need to stop [facility] next week for surgery. Will start Toprol 25 bid for rate control. Titrate as needed    3. HTN: Improved 4. LVAD: LVAD parameters reviewed personally, stable. 5. CKD, stage III:  Stable. Follow with diuresis,  6. Anticoagulation management: Goal INR 2-2.5. He is off ASA with GI bleeding.  However, Plavix was started recently with CVA.  No evidence so far for recurrent GI bleeding.   - Will need to stop AC soon for surgery next week  7. Nephrolithiasis s/p Renal Stone lithotripsy: Per Dr [name]. Stable. 8. 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 Now on Plavix in addition to warfarin.   9. Lumbar compression fracture: This is major limiting factor for him. I d/w Dr. Vertell Limber and he will see him next week to discuss timing of surgery. He understands there is significant risk but wants to proceed.  10. 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  Continue atorvastatin. Plavix added to regimen (also warfarin INR goal 2-2.5).  Recently reviewed carotid studies with Dr Frank Simmering, do not think that there is surgical disease present.     Glori Bickers MD 01/01/2016

## 2016-01-02 ENCOUNTER — Ambulatory Visit (HOSPITAL_COMMUNITY): Payer: Self-pay | Admitting: *Deleted

## 2016-01-02 ENCOUNTER — Telehealth (HOSPITAL_COMMUNITY): Payer: Self-pay | Admitting: *Deleted

## 2016-01-02 LAB — POCT INR: INR: 1.9

## 2016-01-02 NOTE — Telephone Encounter (Signed)
Returned call to [name] re: cancelled lumbar fusion surgery scheduled for this week (01/06/16). Dr. Melven Sartorius office notified VAD team re: need for carotid endarterectomy per Dr. Kellie Simmering prior to spinal surgery.  Dr. Evelena Leyden office has scheduled Right CEA on Friday, January 13, 2016.  Spinal surgery has been rescheduled with Dr. Vertell Limber for Friday, February 03, 2916.  We will need to manage admission and anticoagulants prior to above admissions. Dr. Darcey Nora plans to reach out to Dr. Kellie Simmering to review plan for CEA. Wife verbalized understanding of same.

## 2016-01-03 ENCOUNTER — Encounter: Payer: Self-pay | Admitting: Vascular Surgery

## 2016-01-04 ENCOUNTER — Other Ambulatory Visit: Payer: Self-pay

## 2016-01-04 ENCOUNTER — Encounter (HOSPITAL_COMMUNITY): Payer: Medicare Other

## 2016-01-04 ENCOUNTER — Encounter (HOSPITAL_COMMUNITY): Payer: Self-pay | Admitting: Infectious Diseases

## 2016-01-05 ENCOUNTER — Telehealth: Payer: Self-pay | Admitting: *Deleted

## 2016-01-05 NOTE — Telephone Encounter (Signed)
Error encounter. 

## 2016-01-06 ENCOUNTER — Telehealth (HOSPITAL_COMMUNITY): Payer: Self-pay | Admitting: *Deleted

## 2016-01-06 ENCOUNTER — Ambulatory Visit (HOSPITAL_COMMUNITY): Payer: Self-pay | Admitting: *Deleted

## 2016-01-06 LAB — POCT INR: INR: 2.4

## 2016-01-06 NOTE — Telephone Encounter (Signed)
Called pt re: upcoming admission next week. INR results from today reviewed with PharmD - see anticoag note.  Informed pt/[name] that admissions will call him when bed available next Wednesday, 01/11/16. He has the following instructions and voiced understanding of same:   Right Carotid Endarterectomy with Dr. Kellie Simmering   Procedure Date: Friday 01/13/16  Admission Date: Wednesday 01/11/16  Please check your INR Friday 01/06/16  Last dose of Coumadin: Sunday 01/08/16

## 2016-01-08 ENCOUNTER — Encounter: Payer: Self-pay | Admitting: Cardiology

## 2016-01-08 LAB — CUP PACEART REMOTE DEVICE CHECK
Battery Remaining Longevity: 75 mo
Battery Voltage: 2.98 V
Brady Statistic AS VP Percent: 5.08 %
Brady Statistic RA Percent Paced: 28.06 %
Brady Statistic RV Percent Paced: 5.25 %
HIGH POWER IMPEDANCE MEASURED VALUE: 33 Ohm
HighPow Impedance: 41 Ohm
Implantable Lead Implant Date: 20140801
Implantable Lead Location: 753860
Implantable Lead Model: 5592
Implantable Lead Model: 6944
Lead Channel Impedance Value: 456 Ohm
Lead Channel Impedance Value: 551 Ohm
Lead Channel Pacing Threshold Amplitude: 1 V
Lead Channel Pacing Threshold Amplitude: 1.125 V
Lead Channel Pacing Threshold Pulse Width: 0.4 ms
Lead Channel Sensing Intrinsic Amplitude: 0.625 mV
Lead Channel Sensing Intrinsic Amplitude: 1.25 mV
Lead Channel Setting Pacing Amplitude: 2.25 V
Lead Channel Setting Sensing Sensitivity: 0.3 mV
MDC IDC LEAD IMPLANT DT: 20030714
MDC IDC LEAD LOCATION: 753859
MDC IDC MSMT LEADCHNL RA IMPEDANCE VALUE: 361 Ohm
MDC IDC MSMT LEADCHNL RA SENSING INTR AMPL: 0.625 mV
MDC IDC MSMT LEADCHNL RV PACING THRESHOLD PULSEWIDTH: 0.4 ms
MDC IDC MSMT LEADCHNL RV SENSING INTR AMPL: 1.25 mV
MDC IDC SESS DTM: 20170206142924
MDC IDC SET LEADCHNL RV PACING AMPLITUDE: 2 V
MDC IDC SET LEADCHNL RV PACING PULSEWIDTH: 0.4 ms
MDC IDC STAT BRADY AP VP PERCENT: 0.16 %
MDC IDC STAT BRADY AP VS PERCENT: 27.9 %
MDC IDC STAT BRADY AS VS PERCENT: 66.86 %

## 2016-01-08 NOTE — Progress Notes (Signed)
ICD remote reviewed. Persistent AF (50.6%) x 19 days + warfarin. V rates elevated.  AHF team reviewed - pending upcoming surgery, no DCCV at this time.   Next [facility] 03/12/16

## 2016-01-09 ENCOUNTER — Encounter: Payer: Self-pay | Admitting: Vascular Surgery

## 2016-01-09 ENCOUNTER — Ambulatory Visit (INDEPENDENT_AMBULATORY_CARE_PROVIDER_SITE_OTHER): Payer: Medicare Other | Admitting: Vascular Surgery

## 2016-01-09 VITALS — Temp 97.0°F | Ht 72.0 in | Wt 191.0 lb

## 2016-01-09 DIAGNOSIS — I6521 Occlusion and stenosis of right carotid artery: Secondary | ICD-10-CM | POA: Diagnosis not present

## 2016-01-09 NOTE — Progress Notes (Signed)
Subjective:     Patient ID: [name], male   DOB: 1941-05-17, 75 y.o.   MRN: [id number]  HPI this 75 year old male who is well known to [place] returns today to discuss right carotid surgery. This patient has a remote history of a left carotid endarterectomy performed by me in 2008 which was asymptomatic. December 30 he developed acute onset of weakness and numbness in the left arm and leg which persisted for 24-48 hours before completely resolving. He was admitted to the hospital and evaluated and found to have heavily calcified plaque at the right carotid bifurcation. Patient did not appear to have a severe stenosis in the right ICA by duplex scanning although this was unreliable because the patient does have an LVAD. Patient is chronically anticoagulated on Coumadin. He has had no complications from LVAD. He also suffered a lumbar spine compression fracture in August 2016 and needs lumbar spine surgery by Dr. Vertell Limber. This was delayed until the right carotid endarterectomy could be performed for this symptomatic lesion. Patient has had no previous symptoms from the right brain and has had no subsequent symptoms since January 1.  Past Medical History  Diagnosis Date  . Bradycardia   . Ventricular tachycardia ([place])     a. s/p AICD;   h/o ICD shock;  Amiodarone Rx. S/P ablation in 2012  . PVD (peripheral vascular disease) ([place])     a. 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 HTN (hypertension)   . HLD (hyperlipidemia)   . Cytopenia   . Hypothyroidism   . CKD (chronic kidney disease)   . Chronic systolic heart failure ([place])     a. s/p LVAD 10/2013 for destination therapy  . Ischemic cardiomyopathy   . CVD (cerebrovascular disease)     a. s/p L CEA 2008  . Stroke [facility])     a. 10/2015 admission   . Inappropriate therapy from implantable cardioverter-defibrillator     a. ATP for sinus tach SVT wavelet  identified SVT but was no  passive (2014)  . Automatic implantable cardioverter-defibrillator in situ     a. s/p Medtronic Evera device serial [id number]    . Melanoma of ear ([place])     a. L Ear   . PAF (paroxysmal atrial fibrillation) ([place])   . CAD (coronary artery disease)     a. s/p MI 1982;  s/p DES to CFX 2011;  s/p NSTEMI 4/12 in setting of VTach;  cath 7/12:  LM ok, LAD mid 30-40%, Dx's with 40%, mCFX stent patent, prox to mid RCA occluded with R to R and L to R collats.  Medical Tx was continued  . History of GI bleed     a. on ASA- started on plavix during 10/2015 admission for CVA  . Myocardial infarction ([place])     1992  . Dysrhythmia     AFIB  . Sleep apnea     NO CPAP NOW  HE SENT IT BACK YRS AGO  . Shortness of breath dyspnea     W/ EXERTION   . Arthritis     Social History  Substance Use Topics  . Smoking status: Former Smoker -- 0.50 packs/day for 37 years    Types: Cigarettes    Quit date: 11/05/2001  . Smokeless tobacco: Never Used  . Alcohol Use: 0.0 oz/week    0  Standard drinks or equivalent per week     Comment: 05/25/2013 "mixed drink couple times/month"    Family History  Problem Relation Age of Onset  . Coronary artery disease    . Heart attack Mother   . Coronary artery disease Mother   . Cancer Mother   . Heart disease Mother   . Hyperlipidemia Mother   . Hypertension Mother   . Coronary artery disease Father   . Stroke Father   . Cancer Father   . Heart disease Father     before age 3  . Hyperlipidemia Father   . Hypertension Father   . Heart attack Father     Allergies  Allergen Reactions  . Demerol [Meperidine] Other (See Comments)    Paralysis. Could only move eyes.     Current outpatient prescriptions:  .  amLODipine (NORVASC) 10 MG tablet, Take 1 tablet (10 mg total) by mouth at bedtime., Disp: 30 tablet, Rfl: 6 .  atorvastatin (LIPITOR) 80 MG tablet, Take 80 mg by mouth daily. , Disp: , Rfl:  .  docusate sodium (COLACE) 100  MG capsule, Take 100 mg by mouth daily as needed for mild constipation. , Disp: , Rfl:  .  ferrous gluconate (FERGON) 324 MG tablet, Take 1 tablet (324 mg total) by mouth 2 (two) times daily with a meal., Disp: 60 tablet, Rfl: 6 .  furosemide (LASIX) 20 MG tablet, Take 1 tablet (20 mg total) by mouth as needed for edema (weight gain)., Disp: 30 tablet, Rfl: 3 .  lactose free nutrition (BOOST) LIQD, Take 237 mLs by mouth 2 (two) times daily., Disp: , Rfl:  .  levothyroxine (SYNTHROID, LEVOTHROID) 200 MCG tablet, Take one 200 mcg daily before breakfast every day with 1 1/2 (300 mcg) every Monday. (Patient taking differently: Take 200-300 mcg by mouth daily before breakfast. Take 1 tablet (220mcg) all days except on Mondays take 1&1/2 tablets (358mcg)), Disp: 45 tablet, Rfl: 6 .  metoprolol succinate (TOPROL-XL) 25 MG 24 hr tablet, Take 1 tablet (25 mg total) by mouth 2 (two) times daily., Disp: 60 tablet, Rfl: 3 .  Multiple Vitamin (MULTIVITAMIN WITH MINERALS) TABS tablet, Take 1 tablet by mouth at bedtime., Disp: , Rfl:  .  Multiple Vitamins-Minerals (ICAPS PO), Take 1 tablet by mouth 2 (two) times daily., Disp: , Rfl:  .  nitroGLYCERIN (NITROSTAT) 0.4 MG SL tablet, Place 0.4 mg under the tongue every 5 (five) minutes as needed for chest pain., Disp: , Rfl:  .  pantoprazole (PROTONIX) 40 MG tablet, TAKE 1 TABLET EVERY 12 HOURS., Disp: 60 tablet, Rfl: 3 .  promethazine (PHENERGAN) 12.5 MG tablet, Take 12.5 mg by mouth every 6 (six) hours as needed for nausea or vomiting., Disp: , Rfl:  .  zinc gluconate 50 MG tablet, Take 50 mg by mouth 2 (two) times daily. , Disp: , Rfl:  .  clopidogrel (PLAVIX) 75 MG tablet, Take 1 tablet (75 mg total) by mouth daily. (Patient not taking: Reported on 01/09/2016), Disp: 30 tablet, Rfl: 6 .  RANEXA 500 MG 12 hr tablet, TAKE 1 TABLET (500 MG TOTAL) BY MOUTH 2 (TWO) TIMES DAILY. (Patient not taking: Reported on 01/09/2016), Disp: 60 tablet, Rfl: 6 .  warfarin (COUMADIN) 2 MG  tablet, Take 2-4 mg by mouth daily. Reported on 01/09/2016, Disp: , Rfl:   Filed Vitals:   01/09/16 0931  Temp: 97 F (36.1 C)  Height: 6' (1.829 m)  Weight: 191 lb (86.637 kg)  SpO2: 97%  Body mass index is 25.9 kg/(m^2).           Review of Systems patient has very involved past medical history and review of systems. He does have a history of aortobifemoral bypass grafting, bilateral femoral popliteal bypass grafting, and left carotid endarterectomy by me in the past. Also has severe coronary artery disease and had ischemic cardiomyopathy requiring eventually an LVAD placed in 2014. Patient also has history of compression fracture lumbar spine, paroxysmal atrial fibrillation, sleep apnea, hypertension, hyperlipidemia. Patient denies any active chest pain but does have chronic dyspnea on exertion. No hemoptysis or claudication is reported. Other systems negative and a complete review of systems patient is having her back pain due to his compression fracture     Objective:   Physical Exam Gen.-alert and oriented x3 in no apparent distress HEENT normal for age Lungs no rhonchi or wheezing Cardiovascular regular rhythm no murmurs carotid pulses 3+ palpable no bruits audible-LVAD in place  Abdomen soft nontender no palpable masses Musculoskeletal free of  major deformities Skin clear -no rashes Neurologic normal Lower extremities 3+ femoral pulses palpable bilaterally with no edema  Today I reviewed the CT angiogram of the neck and the duplex scan which were performed in late December 2016. Patient has heavily calcified right internal carotid lesion at the origin. 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 Vertell Limber in the near future because of chronic severe back pain due to compression fracture. Risk and benefits thoroughly discussed with patient and he would like to proceed Patient has had Coumadin discontinued and will be admitted to the hospital on Wednesday, March 8 for IV heparin preoperatively.

## 2016-01-11 ENCOUNTER — Other Ambulatory Visit (HOSPITAL_COMMUNITY): Payer: Self-pay | Admitting: Adult Health

## 2016-01-11 ENCOUNTER — Inpatient Hospital Stay (HOSPITAL_COMMUNITY)
Admission: RE | Admit: 2016-01-11 | Discharge: 2016-01-20 | DRG: 037 | Disposition: A | Discharge status: Home / Self Care | Payer: Medicare Other | Source: Ambulatory Visit | Attending: Internal Medicine | Admitting: Internal Medicine

## 2016-01-11 DIAGNOSIS — Z955 Presence of coronary angioplasty implant and graft: Secondary | ICD-10-CM

## 2016-01-11 DIAGNOSIS — Z79899 Other long term (current) drug therapy: Secondary | ICD-10-CM | POA: Diagnosis not present

## 2016-01-11 DIAGNOSIS — Z87891 Personal history of nicotine dependence: Secondary | ICD-10-CM

## 2016-01-11 DIAGNOSIS — I480 Paroxysmal atrial fibrillation: Secondary | ICD-10-CM

## 2016-01-11 DIAGNOSIS — I252 Old myocardial infarction: Secondary | ICD-10-CM | POA: Diagnosis not present

## 2016-01-11 DIAGNOSIS — I6521 Occlusion and stenosis of right carotid artery: Principal | ICD-10-CM | POA: Diagnosis present

## 2016-01-11 DIAGNOSIS — E785 Hyperlipidemia, unspecified: Secondary | ICD-10-CM | POA: Diagnosis present

## 2016-01-11 DIAGNOSIS — M4856XA Collapsed vertebra, not elsewhere classified, lumbar region, initial encounter for fracture: Secondary | ICD-10-CM | POA: Diagnosis present

## 2016-01-11 DIAGNOSIS — Z85820 Personal history of malignant melanoma of skin: Secondary | ICD-10-CM | POA: Diagnosis not present

## 2016-01-11 DIAGNOSIS — I255 Ischemic cardiomyopathy: Secondary | ICD-10-CM | POA: Diagnosis present

## 2016-01-11 DIAGNOSIS — I471 Supraventricular tachycardia: Secondary | ICD-10-CM | POA: Diagnosis present

## 2016-01-11 DIAGNOSIS — E039 Hypothyroidism, unspecified: Secondary | ICD-10-CM | POA: Diagnosis present

## 2016-01-11 DIAGNOSIS — I6529 Occlusion and stenosis of unspecified carotid artery: Secondary | ICD-10-CM | POA: Diagnosis present

## 2016-01-11 DIAGNOSIS — I5022 Chronic systolic (congestive) heart failure: Secondary | ICD-10-CM | POA: Diagnosis not present

## 2016-01-11 DIAGNOSIS — I4891 Unspecified atrial fibrillation: Secondary | ICD-10-CM | POA: Diagnosis not present

## 2016-01-11 DIAGNOSIS — I483 Typical atrial flutter: Secondary | ICD-10-CM | POA: Diagnosis not present

## 2016-01-11 DIAGNOSIS — N183 Chronic kidney disease, stage 3 (moderate): Secondary | ICD-10-CM | POA: Diagnosis present

## 2016-01-11 DIAGNOSIS — I5023 Acute on chronic systolic (congestive) heart failure: Secondary | ICD-10-CM | POA: Diagnosis present

## 2016-01-11 DIAGNOSIS — J9601 Acute respiratory failure with hypoxia: Secondary | ICD-10-CM | POA: Diagnosis not present

## 2016-01-11 DIAGNOSIS — I130 Hypertensive heart and chronic kidney disease with heart failure and stage 1 through stage 4 chronic kidney disease, or unspecified chronic kidney disease: Secondary | ICD-10-CM | POA: Diagnosis present

## 2016-01-11 DIAGNOSIS — Z95810 Presence of automatic (implantable) cardiac defibrillator: Secondary | ICD-10-CM

## 2016-01-11 DIAGNOSIS — Z95811 Presence of heart assist device: Secondary | ICD-10-CM

## 2016-01-11 DIAGNOSIS — I4892 Unspecified atrial flutter: Secondary | ICD-10-CM | POA: Diagnosis not present

## 2016-01-11 DIAGNOSIS — Z7902 Long term (current) use of antithrombotics/antiplatelets: Secondary | ICD-10-CM | POA: Diagnosis not present

## 2016-01-11 DIAGNOSIS — Z7901 Long term (current) use of anticoagulants: Secondary | ICD-10-CM

## 2016-01-11 DIAGNOSIS — G4730 Sleep apnea, unspecified: Secondary | ICD-10-CM | POA: Diagnosis present

## 2016-01-11 DIAGNOSIS — I2510 Atherosclerotic heart disease of native coronary artery without angina pectoris: Secondary | ICD-10-CM | POA: Diagnosis present

## 2016-01-11 DIAGNOSIS — Z8673 Personal history of transient ischemic attack (TIA), and cerebral infarction without residual deficits: Secondary | ICD-10-CM | POA: Diagnosis not present

## 2016-01-11 DIAGNOSIS — I509 Heart failure, unspecified: Secondary | ICD-10-CM

## 2016-01-11 DIAGNOSIS — I4719 Other supraventricular tachycardia: Secondary | ICD-10-CM | POA: Diagnosis present

## 2016-01-11 LAB — COMPREHENSIVE METABOLIC PANEL
ALT: 20 U/L (ref 17–63)
AST: 30 U/L (ref 15–41)
Albumin: 3.4 g/dL — ABNORMAL LOW (ref 3.5–5.0)
Alkaline Phosphatase: 107 U/L (ref 38–126)
Anion gap: 9 (ref 5–15)
BUN: 20 mg/dL (ref 6–20)
CO2: 23 mmol/L (ref 22–32)
CREATININE: 1.59 mg/dL — AB (ref 0.61–1.24)
Calcium: 8.9 mg/dL (ref 8.9–10.3)
Chloride: 110 mmol/L (ref 101–111)
GFR, EST AFRICAN AMERICAN: 47 mL/min — AB (ref >60)
GFR, EST NON AFRICAN AMERICAN: 41 mL/min — AB (ref >60)
Glucose, Bld: 108 mg/dL — ABNORMAL HIGH (ref 65–99)
POTASSIUM: 4.1 mmol/L (ref 3.5–5.1)
SODIUM: 142 mmol/L (ref 135–145)
Total Bilirubin: 0.4 mg/dL (ref 0.3–1.2)
Total Protein: 6.6 g/dL (ref 6.5–8.1)

## 2016-01-11 LAB — CBC
HCT: 32.2 % — ABNORMAL LOW (ref 39.0–52.0)
Hemoglobin: 10.5 g/dL — ABNORMAL LOW (ref 13.0–17.0)
MCH: 30.4 pg (ref 26.0–34.0)
MCHC: 32.6 g/dL (ref 30.0–36.0)
MCV: 93.3 fL (ref 78.0–100.0)
PLATELETS: 171 10*3/uL (ref 150–400)
RBC: 3.45 MIL/uL — AB (ref 4.22–5.81)
RDW: 17 % — AB (ref 11.5–15.5)
WBC: 6.5 10*3/uL (ref 4.0–10.5)

## 2016-01-11 LAB — PREPARE RBC (CROSSMATCH)

## 2016-01-11 LAB — PROTIME-INR
INR: 2 — AB (ref 0.00–1.49)
PROTHROMBIN TIME: 22.6 s — AB (ref 11.6–15.2)

## 2016-01-11 LAB — APTT: aPTT: 44 seconds — ABNORMAL HIGH (ref 24–37)

## 2016-01-11 MED ORDER — DILTIAZEM HCL 100 MG IV SOLR
INTRAVENOUS | Status: AC
  Filled 2016-01-11: qty 100

## 2016-01-11 MED ORDER — RANOLAZINE ER 500 MG PO TB12
500.0000 mg | ORAL_TABLET | Freq: Two times a day (BID) | ORAL | Status: DC
  Administered 2016-01-11 – 2016-01-20 (×16): 500 mg via ORAL
  Filled 2016-01-11 (×23): qty 1

## 2016-01-11 MED ORDER — SODIUM CHLORIDE 0.9 % IV SOLN
INTRAVENOUS | Status: DC
  Administered 2016-01-11: 21:00:00 via INTRAVENOUS

## 2016-01-11 MED ORDER — SODIUM CHLORIDE 0.9 % IV SOLN
INTRAVENOUS | Status: DC
  Administered 2016-01-11: 21:00:00 via INTRAVENOUS
  Administered 2016-01-13: 10 mL/h via INTRAVENOUS

## 2016-01-11 MED ORDER — LEVOTHYROXINE SODIUM 100 MCG PO TABS
200.0000 ug | ORAL_TABLET | Freq: Every day | ORAL | Status: DC
  Administered 2016-01-12 – 2016-01-20 (×8): 200 ug via ORAL
  Filled 2016-01-11 (×11): qty 2

## 2016-01-11 MED ORDER — CHLORHEXIDINE GLUCONATE CLOTH 2 % EX PADS
6.0000 | MEDICATED_PAD | Freq: Once | CUTANEOUS | Status: AC
  Administered 2016-01-13: 6 via TOPICAL

## 2016-01-11 MED ORDER — METOPROLOL TARTRATE 1 MG/ML IV SOLN
5.0000 mg | Freq: Once | INTRAVENOUS | Status: AC
  Administered 2016-01-11: 5 mg via INTRAVENOUS

## 2016-01-11 MED ORDER — FERROUS GLUCONATE 324 (38 FE) MG PO TABS
324.0000 mg | ORAL_TABLET | Freq: Two times a day (BID) | ORAL | Status: DC
  Administered 2016-01-12 – 2016-01-20 (×16): 324 mg via ORAL
  Filled 2016-01-11 (×19): qty 1

## 2016-01-11 MED ORDER — PANTOPRAZOLE SODIUM 40 MG PO TBEC
40.0000 mg | DELAYED_RELEASE_TABLET | Freq: Two times a day (BID) | ORAL | Status: DC
  Administered 2016-01-11 – 2016-01-20 (×17): 40 mg via ORAL
  Filled 2016-01-11 (×17): qty 1

## 2016-01-11 MED ORDER — DEXTROSE 5 % IV SOLN
1.5000 g | INTRAVENOUS | Status: AC
  Administered 2016-01-13: 1.5 g via INTRAVENOUS
  Filled 2016-01-11: qty 1.5

## 2016-01-11 MED ORDER — CHLORHEXIDINE GLUCONATE CLOTH 2 % EX PADS
6.0000 | MEDICATED_PAD | Freq: Once | CUTANEOUS | Status: AC
  Administered 2016-01-12: 6 via TOPICAL

## 2016-01-11 MED ORDER — ATORVASTATIN CALCIUM 80 MG PO TABS
80.0000 mg | ORAL_TABLET | Freq: Every day | ORAL | Status: DC
  Administered 2016-01-12 – 2016-01-19 (×8): 80 mg via ORAL
  Filled 2016-01-11 (×6): qty 1
  Filled 2016-01-11: qty 2
  Filled 2016-01-11: qty 1

## 2016-01-11 MED ORDER — DEXTROSE 5 % IV SOLN
1.5000 g | INTRAVENOUS | Status: DC
  Filled 2016-01-11: qty 1.5

## 2016-01-11 MED ORDER — METOPROLOL SUCCINATE ER 25 MG PO TB24
25.0000 mg | ORAL_TABLET | Freq: Two times a day (BID) | ORAL | Status: DC
  Administered 2016-01-11 – 2016-01-12 (×2): 25 mg via ORAL
  Filled 2016-01-11 (×2): qty 1

## 2016-01-11 MED ORDER — METOPROLOL TARTRATE 1 MG/ML IV SOLN
INTRAVENOUS | Status: AC
  Filled 2016-01-11: qty 5

## 2016-01-11 MED ORDER — PROMETHAZINE HCL 25 MG PO TABS: 12.5000 mg | ORAL_TABLET | Freq: Four times a day (QID) | ORAL | Status: DC | PRN

## 2016-01-11 MED ORDER — DILTIAZEM HCL 100 MG IV SOLR
10.0000 mg/h | INTRAVENOUS | Status: DC
  Administered 2016-01-11: 5 mg/h via INTRAVENOUS
  Administered 2016-01-12 – 2016-01-13 (×3): 10 mg/h via INTRAVENOUS
  Filled 2016-01-11 (×4): qty 100

## 2016-01-11 MED ORDER — AMLODIPINE BESYLATE 10 MG PO TABS
10.0000 mg | ORAL_TABLET | Freq: Every day | ORAL | Status: DC
  Administered 2016-01-11 – 2016-01-14 (×3): 10 mg via ORAL
  Filled 2016-01-11 (×3): qty 1

## 2016-01-11 NOTE — Progress Notes (Signed)
Anesthesia follow-up: See my note from 12/29/15 with addendum from 01/02/16. Since then patient has been evaluated by Dr. Kellie Simmering and is scheduled for right CEA on 01/13/16. His lumbar fusion is currently on hold at least until 02/03/16. Planned admission today is scheduled by Dr. Haroldine Laws for LVAD/bridging management while warfarin is on hold. Zada Girt, RN (LVAD RN) is aware of surgery plans. ICD remote check on 01/08/16 showed "Persistent AF (50.6%) x 19 days + warfarin. V rates elevated." If rates are still elevated on admission then cardiology will need to address at that time.   [name] Phone [phone] 01/11/2016 3:41 PM

## 2016-01-11 NOTE — H&P (Signed)
VAD TEAM History & Physical Note   Reason for Admission: Scheduled carotid surgery    HPI:    [name] is a 75 yo male with h/o VT, PAD and severe systolic HF (EF 0000000) due to mixed ischemic/nonischemic CM he is s/p HM II LVAD implant for destination therapy on October 19, 2013. On 09/01/14 underwent cystoscopy and flexible ureteroscopy with lithotripsy and basket extraction.   VAD implantation was complicated by VT, syncope, A fib/Aflutter and persistent low output felt to be due to RHF. RHC showed low cardiac output but no evidence of RV failure on Milrinone. He was felt to be volume depleted. He was discharged home on 11/18/13 on mirlinone and PRN lasix 20 mg for a weight of 200 pounds or greater. Milrinone has since been weaned off.   RHC (07/13/14): Well compensated hemodynamics with VAD support.  Admitted 11/9-11/19/15 for hematuria and chronic nephrolithiasis. Underwent Echo to assess RV for concern RV failure because he presented with SOB. RV looked good and had renal US showed mild hyronephrosis and bilateral renal cysts. Dr. Risa Grill took back for repeat lithotripsy and stone extraction by ureteroscopy and J stent placement. Received total of 3 PRBCs. D/C weight 188 lbs.   In 4/16 admitted with weakness and fall. In ER hemoglobin was found to be 5.4 with MAP 78. While hospitalized he received 5U PRBCs. 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 Then readmitted with acute L4 compression fracture. Most recently admitted with weakness and low PI. Found to have recurrent atrial tach and underwent TEE/DCCV on 03/02/15.  He was admitted in 12/16 with CVA causing left-sided weakness. CT did not show CVA and unable to undergo MRI with device. There was initially some concern for significant carotid stenosis and Dr Kellie Simmering has scheduled caroltic endarterectomy.     LVAD INTERROGATION:   HeartMate II LVAD: Flow 5.0  liters/min, speed 9000, power 4.6, PI 4.5.    Review of Systems: [y] = yes, [ ]  = no    General: Weight gain [ ] ; Weight loss [ ] ; Anorexia [ ] ; Fatigue Blue.Reese ]; Fever [ ] ; Chills [ ] ; Weakness Blue.Reese ]   Cardiac: Chest pain/pressure [ ] ; Resting SOB [ ] ; Exertional SOB [ ] ; Orthopnea [ ] ; Pedal Edema Blue.Reese ]; Palpitations [ ] ; Syncope [ ] ; Presyncope [ ] ; Paroxysmal nocturnal dyspnea[ ]    Pulmonary: Cough [ ] ; Wheezing[ ] ; Hemoptysis[ ] ; Sputum [ ] ; Snoring [ ]    GI: Vomiting[ ] ; Dysphagia[ ] ; Melena[ ] ; Hematochezia [ ] ; Heartburn[ ] ; Abdominal pain [ ] ; Constipation [ ] ; Diarrhea [ ] ; BRBPR [ ]    GU: Hematuria[ ] ; Dysuria [ ] ; Nocturia[ ]   Vascular: Pain in legs with walking [ ] ; Pain in feet with lying flat [ ] ; Non-healing sores [ ] ; Stroke [ ] ; TIA [ ] ; Slurred speech [ ] ;   Neuro: Headaches[ ] ; Vertigo[ ] ; Seizures[ ] ; Paresthesias[ ] ;Blurred vision [ ] ; Diplopia [ ] ; Vision changes [ ]    Ortho/Skin: Arthritis [ y]; Joint pain Blue.Reese ]; Muscle pain [ ] ; Joint swelling [ ] ; Back Pain [ y]; Rash [ ]    Psych: Depression[ ] ; Anxiety[ ]    Heme: Bleeding problems [ y]; Clotting disorders [ ] ; Anemia y[ ]    Endocrine: Diabetes [ ] ; Thyroid dysfunction[ ]   Home Medications Prior to Admission medications   Medication Sig Start Date End Date Taking? Authorizing Provider  amLODipine (NORVASC) 10 MG tablet Take 1  tablet (10 mg total) by mouth at bedtime. 11/07/15   Eileen Stanford, PA-C  atorvastatin (LIPITOR) 80 MG tablet Take 80 mg by mouth daily.     Historical Provider, MD  clopidogrel (PLAVIX) 75 MG tablet Take 1 tablet (75 mg total) by mouth daily. Patient not taking: Reported on 01/09/2016 11/07/15   Eileen Stanford, PA-C  docusate sodium (COLACE) 100 MG capsule Take 100 mg by mouth daily as needed for mild constipation.     Historical Provider, MD  ferrous gluconate (FERGON) 324 MG tablet Take 1 tablet (324 mg  total) by mouth 2 (two) times daily with a meal. 06/23/15   Amy D Clegg, NP  furosemide (LASIX) 20 MG tablet Take 1 tablet (20 mg total) by mouth as needed for edema (weight gain). 11/14/15   Jolaine Artist, MD  lactose free nutrition (BOOST) LIQD Take 237 mLs by mouth 2 (two) times daily.    Historical Provider, MD  levothyroxine (SYNTHROID, LEVOTHROID) 200 MCG tablet Take one 200 mcg daily before breakfast every day with 1 1/2 (300 mcg) every Monday. Patient taking differently: Take 200-300 mcg by mouth daily before breakfast. Take 1 tablet (28mcg) all days except on Mondays take 1&1/2 tablets (321mcg) 10/26/15   Jolaine Artist, MD  metoprolol succinate (TOPROL-XL) 25 MG 24 hr tablet Take 1 tablet (25 mg total) by mouth 2 (two) times daily. 12/29/15   Jolaine Artist, MD  Multiple Vitamin (MULTIVITAMIN WITH MINERALS) TABS tablet Take 1 tablet by mouth at bedtime.    Historical Provider, MD  Multiple Vitamins-Minerals (ICAPS PO) Take 1 tablet by mouth 2 (two) times daily.    Historical Provider, MD  nitroGLYCERIN (NITROSTAT) 0.4 MG SL tablet Place 0.4 mg under the tongue every 5 (five) minutes as needed for chest pain.    Historical Provider, MD  pantoprazole (PROTONIX) 40 MG tablet TAKE 1 TABLET EVERY 12 HOURS. 12/19/15   Jolaine Artist, MD  promethazine (PHENERGAN) 12.5 MG tablet Take 12.5 mg by mouth every 6 (six) hours as needed for nausea or vomiting.    Historical Provider, MD  RANEXA 500 MG 12 hr tablet TAKE 1 TABLET (500 MG TOTAL) BY MOUTH 2 (TWO) TIMES DAILY. Patient not taking: Reported on 01/09/2016 10/17/15   Larey Dresser, MD  warfarin (COUMADIN) 2 MG tablet Take 2-4 mg by mouth daily. Reported on 01/09/2016    Historical Provider, MD  zinc gluconate 50 MG tablet Take 50 mg by mouth 2 (two) times daily.     Historical Provider, MD    Past Medical History: Past Medical History  Diagnosis Date  .  Bradycardia   . Ventricular tachycardia ([place])     a. s/p AICD; h/o ICD shock; Amiodarone Rx. S/P ablation in 2012  . PVD (peripheral vascular disease) ([place])     a. h/o claudication; s/p R fem to pop BPG 3/11; s/p L CFA BPG 7/03; s/p repair of inf AAA 6/03; s/p aorta to bilat renal BPG 6/03  . HTN (hypertension)   . HLD (hyperlipidemia)   . Cytopenia   . Hypothyroidism   . CKD (chronic kidney disease)   . Chronic systolic heart failure ([place])     a. s/p LVAD 10/2013 for destination therapy  . Ischemic cardiomyopathy   . CVD (cerebrovascular disease)     a. s/p L CEA 2008  . Stroke [facility])     a. 10/2015 admission   . Inappropriate therapy from implantable cardioverter-defibrillator     a.  ATP for sinus tach SVT wavelet identified SVT but was no passive (2014)  . Automatic implantable cardioverter-defibrillator in situ     a. s/p Medtronic Evera device serial [id number]   . 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 L Ear   . PAF (paroxysmal atrial fibrillation) ([place])   . CAD (coronary artery disease)     a. s/p MI 1982; s/p DES to CFX 2011; s/p NSTEMI 4/12 in setting of VTach; cath 7/12: LM ok, LAD mid 30-40%, Dx's with 40%, mCFX stent patent, prox to mid RCA occluded with R to R and L to R collats. Medical Tx was continued  . History of GI bleed     a. on ASA- started on plavix during 10/2015 admission for CVA  . Myocardial infarction ([place])     1992  . Dysrhythmia     AFIB  . Sleep apnea     NO CPAP NOW HE SENT IT BACK YRS AGO  . Shortness of breath dyspnea     W/ EXERTION   . Arthritis     Past Surgical History: Past Surgical History  Procedure Laterality Date  . Femoral-popliteal bypass graft Right 2011  . Cardiac defibrillator placement  2003; 2005; 05/25/2013    2014: Medtronic Evera device serial number [id number]  . Revascularization / in-situ  graft leg    . Renal artery bypass Bilateral 2003  . Back surgery    . Lumbar disc surgery  1974; 2000's  . Knee arthroscopy Bilateral 1990's  . Elbow arthroscopy Right 1990's  . Cholecystectomy  12/15/?2010  . Lipoma excision Left 2013    "near ear" (05/25/2013)  . Heel spur surgery Right 1990's  . Cataract extraction w/ intraocular lens implant, bilateral Bilateral 2000  . Carotid endarterectomy Left ~ 2007  . Doppler echocardiography  2011  . Tonsillectomy  1946  . Femoral-popliteal bypass graft Left 05/2002    Archie Endo 05/06/2002 (05/25/2013)  . Tumor excision Left 1960's    "fatty tumor" (05/25/2013)  . Cardiac electrophysiology study and ablation  2012  . Coronary angioplasty  1992  . Cardiac catheterization      "several" (05/25/2013)  . Coronary angioplasty with stent placement      "last one was 11/2012 (05/25/2013)  . Cardioversion N/A 10/15/2013    Procedure: CARDIOVERSION; Surgeon: Jolaine Artist, MD; Location: [place]; Service: Cardiovascular; Laterality: N/A;  . Insertion of implantable left ventricular assist device N/A 10/19/2013    Procedure: INSERTION OF IMPLANTABLE LEFT VENTRICULAR ASSIST DEVICE; Surgeon: Gaye Pollack, MD; Location: [place]; Service: Open Heart Surgery; Laterality: N/A; CIRC ARREST  NITRIC OXIDE  MEDTRONIC ICD  . Intraoperative transesophageal echocardiogram N/A 10/19/2013    Procedure: INTRAOPERATIVE TRANSESOPHAGEAL ECHOCARDIOGRAM; Surgeon: Gaye Pollack, MD; Location: [facility] OR; Service: Open Heart Surgery; Laterality: N/A;  . Tee without cardioversion N/A 11/04/2013    Procedure: TRANSESOPHAGEAL ECHOCARDIOGRAM (TEE); Surgeon: Larey Dresser, MD; Location: [place]; Service: Cardiovascular; Laterality: N/A;  . Cardioversion N/A 11/04/2013    Procedure: CARDIOVERSION; Surgeon: Larey Dresser, MD; Location:  [place]; Service: Cardiovascular; Laterality: N/A;  . Cardioversion N/A 06/04/2014    Procedure: CARDIOVERSION; Surgeon: Jolaine Artist, MD; Location: [facility] ENDOSCOPY; Service: Cardiovascular; Laterality: N/A;  . Cystoscopy with retrograde pyelogram, ureteroscopy and stent placement Left 08/09/2014    Procedure: [place], URETEROSCOPY AND STENT PLACEMENT; Surgeon: Bernestine Amass, MD; Location: [facility] ORS; Service: Urology; Laterality: Left;  . Cystoscopy with retrograde pyelogram, ureteroscopy and stent placement Left 09/01/2014  Procedure: CYSTOSCOPY WITH URETEROSCOPY AND STENT REMOVAL; Surgeon: Bernestine Amass, MD; Location: [facility] ORS; Service: Urology; Laterality: Left;  . Holmium laser application Left [id number]    Procedure: HOLMIUM LASER APPLICATION; Surgeon: Bernestine Amass, MD; Location: [facility] ORS; Service: Urology; Laterality: Left;  . Cystoscopy with retrograde pyelogram, ureteroscopy and stent placement Left 09/20/2014    Procedure: CYSTOSCOPY WITH RETROGRADE PYELOGRAM, URETEROSCOPY AND STENT PLACEMENT, stone removal; Surgeon: Bernestine Amass, MD; Location: [facility] ORS; Service: Urology; Laterality: Left;  Stephanie Coup ablation N/A 09/12/2011    Procedure: V-TACH ABLATION; Surgeon: Evans Lance, MD; Location: [facility] CATH LAB; Service: Cardiovascular; Laterality: N/A;  . Left heart catheterization with coronary angiogram N/A 11/24/2012    Procedure: LEFT HEART CATHETERIZATION WITH CORONARY ANGIOGRAM; Surgeon: Peter M Martinique, MD; Location: [facility] CATH LAB; Service: Cardiovascular; Laterality: N/A;  . Implantable cardioverter defibrillator generator change N/A 05/25/2013    Procedure: IMPLANTABLE CARDIOVERTER DEFIBRILLATOR GENERATOR CHANGE; Surgeon: Deboraha Sprang, MD; Location: [facility] CATH LAB; Service: Cardiovascular; Laterality: N/A;  . Lead revision N/A 06/05/2013    Procedure: LEAD REVISION;  Surgeon: Thompson Grayer, MD; Location: [facility] CATH LAB; Service: Cardiovascular; Laterality: N/A;  . V-tach ablation N/A 06/08/2013    Procedure: V-TACH ABLATION; Surgeon: Evans Lance, MD; Location: [facility] CATH LAB; Service: Cardiovascular; Laterality: N/A;  . Right heart catheterization N/A 09/17/2013    Procedure: RIGHT HEART CATH; Surgeon: Jolaine Artist, MD; Location: [facility] CATH LAB; Service: Cardiovascular; Laterality: N/A;  . Right heart catheterization N/A 10/14/2013    Procedure: RIGHT HEART CATH; Surgeon: Jolaine Artist, MD; Location: [facility] CATH LAB; Service: Cardiovascular; Laterality: N/A;  . Right heart catheterization N/A 11/16/2013    Procedure: RIGHT HEART CATH; Surgeon: Jolaine Artist, MD; Location: [facility] CATH LAB; Service: Cardiovascular; Laterality: N/A;  . Right heart catheterization N/A 07/13/2014    Procedure: RIGHT HEART CATH; Surgeon: Jolaine Artist, MD; Location: [facility] CATH LAB; Service: Cardiovascular; Laterality: N/A;  . Esophagogastroduodenoscopy N/A 02/15/2015    Procedure: ESOPHAGOGASTRODUODENOSCOPY (EGD); Surgeon: Carol Ada, MD; Location: [facility] ENDOSCOPY; Service: Endoscopy; Laterality: N/A; LVAD  . Tee without cardioversion N/A 03/02/2015    Procedure: TRANSESOPHAGEAL ECHOCARDIOGRAM (TEE); Surgeon: Jolaine Artist, MD; Location: [facility] ENDOSCOPY; Service: Cardiovascular; Laterality: N/A;  . Cardioversion N/A 03/02/2015    Procedure: CARDIOVERSION; Surgeon: Jolaine Artist, MD; Location: [facility] ENDOSCOPY; Service: Cardiovascular; Laterality: N/A;  . Eye surgery      VITRECTOMY LEFT 05/2012    Family History: Family History  Problem Relation Age of Onset  . Coronary artery disease    . Heart attack Mother   . Coronary artery disease Mother   . Cancer Mother   . Heart disease Mother   . Hyperlipidemia Mother   . Hypertension Mother   .  Coronary artery disease Father   . Stroke Father   . Cancer Father   . Heart disease Father     before age 57  . Hyperlipidemia Father   . Hypertension Father   . Heart attack Father     Social History: Social History   Social History  . Marital Status: Divorced    Spouse Name: N/A  . Number of Children: 2  . Years of Education: N/A   Occupational History  . retired     [employer]  . 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 Smoking status: Former Smoker -- 0.50 packs/day for 37 years  Types: Cigarettes    Quit date: 11/05/2001  . Smokeless tobacco: Never Used  . Alcohol Use: 0.0 oz/week    0 Standard drinks or equivalent per week     Comment: 05/25/2013 "mixed drink couple times/month"  . Drug Use: No  . Sexual Activity: No   Other Topics Concern  . Not on file   Social History Narrative   Lives with sig other.    Allergies:  Allergies  Allergen Reactions  . Demerol [Meperidine] Other (See Comments)    Paralysis. Could only move eyes.    Objective:    Vital Signs:  Mean arterial Pressure 70-s  Physical Exam: General: Well appearing. No resp difficulty HEENT: normal Neck: supple. JVP 7 . Carotids 2+ bilat; no bruits. No lymphadenopathy or thryomegaly appreciated. Cor: Mechanical heart sounds with LVAD hum present. Lungs: clear Abdomen: soft, nontender, nondistended. No hepatosplenomegaly. No bruits or masses. Good bowel sounds. Driveline: C/D/I; securement device intact and driveline incorporated Extremities: no cyanosis, clubbing, rash, tr edema Neuro: alert & orientedx3, cranial nerves grossly intact. moves all 4 extremities w/o difficulty. Affect pleasant  Telemetry: AF  Labs: Basic Metabolic Panel:  Last Labs     No results for  input(s): NA, K, CL, CO2, GLUCOSE, BUN, CREATININE, CALCIUM, MG, PHOS in the last 168 hours.    Liver Function Tests:  Last Labs     No results for input(s): AST, ALT, ALKPHOS, BILITOT, PROT, ALBUMIN in the last 168 hours.    Last Labs     No results for input(s): LIPASE, AMYLASE in the last 168 hours.    Last Labs     No results for input(s): AMMONIA in the last 168 hours.    CBC:  Last Labs     No results for input(s): WBC, NEUTROABS, HGB, HCT, MCV, PLT in the last 168 hours.    Cardiac Enzymes:  Last Labs     No results for input(s): CKTOTAL, CKMB, CKMBINDEX, TROPONINI in the last 168 hours.    BNP: BNP (last 3 results)  Recent Labs (within last 365 days)     Recent Labs  09/21/15 0021 09/22/15 1330 10/07/15 1125  BNP 638.3* 707.6* 491.6*      ProBNP (last 3 results)  Recent Labs (within last 365 days)    No results for input(s): PROBNP in the last 8760 hours.     CBG:  Last Labs     No results for input(s): GLUCAP in the last 168 hours.    Coagulation Studies:  Recent Labs (last 2 labs)     No results for input(s): LABPROT, INR in the last 72 hours.    Other results:  EKG: s"}.  Imaging:    Imaging Results (Last 48 hours)    No results found.        Assessment:  Admit for scheduled carotid surgery  1. High-grade R internal carotid stenosis 2. Chronic systolic HF: s/p LVAD [id number]. Remains NYHA II-III.  - Volume status stable. No lasix for now.  3. Atrial arrhythmias: Atrial fibrillation, atrial tachycardia. S/p DC-CV 06/04/14 and repeat DC-CV 03/02/15. Now off amio due to thyrotoxicity.  - Has had recurrent AF . Cannot use amio due to thyrotoxicity. Unable to DC-CV due to need to stop coumadin. Continue Toprol 25 bid for rate control.  4. HTN: stable 5. LVAD: LVAD parameters reviewed personally, stable. 6. CKD, stage III: Stable. Follow with diuresis,  7. Anticoagulation management:  Off coumadin for  surgery. Cover with heparin.  8.  Nephrolithiasis s/p Renal Stone lithotripsy: Per Dr Risa Grill. Stable. 9. 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 Now off Plavix and warfarin.  10. Lumbar compression fracture: This is major limiting factor for him. Follows with Dr. Vertell Limber. Pending surgery once recovered from carotid surgery 11. 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 Continue atorvastatin.    I reviewed the LVAD parameters from today, and compared the results to the patient's prior recorded data. No programming changes were made. The LVAD is functioning within specified parameters. The patient performs LVAD self-test daily. LVAD interrogation was negative for any significant power changes, alarms or PI events/speed drops. LVAD equipment check completed and is in good working order. Back-up equipment present. LVAD education done on emergency procedures and precautions and reviewed exit site care.  Length of Stay:   Darrick Grinder NP-C  01/11/2016, 2:12 PM VAD Team Pager [phone] (7am - 7am) +++VAD ISSUES ONLY+++ Advanced Heart Failure Team Pager [phone] (M-F; [place])  Please contact [facility] for night-coverage after hours (4p -7a ) and weekends on [email or web address] for all non- LVAD Issues  Patient seen and examined with Darrick Grinder, NP. We discussed all aspects of the encounter. I agree with the assessment and plan as stated above.   He is admitted to carotid surgery. Will check labs and cover with heparin as needed. LVAD parameters ok. AF is rate controlled tolerating ok.   Thoams Siefert,MD 7:00 PM

## 2016-01-11 NOTE — Progress Notes (Signed)
ANTICOAGULATION CONSULT NOTE - Initial Consult  Pharmacy Consult for Heparin Indication: VAD  Allergies  Allergen Reactions  . Demerol [Meperidine] Other (See Comments)    Paralysis. Could only move eyes.    Patient Measurements:   Heparin Dosing Weight:   Vital Signs: Temp: 97.6 F (36.4 C) (03/08 2000) Temp Source: Oral (03/08 2000)  Labs:  Recent Labs  01/11/16 1953  HGB 10.5*  HCT 32.2*  PLT 171  APTT 44*  LABPROT 22.6*  INR 2.00*  CREATININE 1.59*    Estimated Creatinine Clearance: 43.4 mL/min (by C-G formula based on Cr of 1.59).   Medical History: Past Medical History  Diagnosis Date  . Bradycardia   . Ventricular tachycardia ([place])     a. s/p AICD;   h/o ICD shock;  Amiodarone Rx. S/P ablation in 2012  . PVD (peripheral vascular disease) ([place])     a. 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 HTN (hypertension)   . HLD (hyperlipidemia)   . Cytopenia   . Hypothyroidism   . CKD (chronic kidney disease)   . Chronic systolic heart failure ([place])     a. s/p LVAD 10/2013 for destination therapy  . Ischemic cardiomyopathy   . CVD (cerebrovascular disease)     a. s/p L CEA 2008  . Stroke [facility])     a. 10/2015 admission   . Inappropriate therapy from implantable cardioverter-defibrillator     a. ATP for sinus tach SVT wavelet identified SVT but was no  passive (2014)  . Automatic implantable cardioverter-defibrillator in situ     a. s/p Medtronic Evera device serial [id number]    . 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 L Ear   . PAF (paroxysmal atrial fibrillation) ([place])   . CAD (coronary artery disease)     a. s/p MI 1982;  s/p DES to CFX 2011;  s/p NSTEMI 4/12 in setting of VTach;  cath 7/12:  LM ok, LAD mid 30-40%, Dx's with 40%, mCFX stent patent, prox to mid RCA occluded with R to R and L to R collats.  Medical Tx was continued  . History of GI bleed     a. on ASA-  started on plavix during 10/2015 admission for CVA  . Myocardial infarction ([place])     1992  . Dysrhythmia     AFIB  . Sleep apnea     NO CPAP NOW  HE SENT IT BACK YRS AGO  . Shortness of breath dyspnea     W/ EXERTION   . Arthritis     Medications:  Prescriptions prior to admission  Medication Sig Dispense Refill Last Dose  . amLODipine (NORVASC) 10 MG tablet Take 1 tablet (10 mg total) by mouth at bedtime. 30 tablet 6 01/10/2016 at Unknown time  . atorvastatin (LIPITOR) 80 MG tablet Take 80 mg by mouth daily.    01/11/2016 at Unknown time  . docusate sodium (COLACE) 100 MG capsule Take 100 mg by mouth daily as needed for mild constipation.    01/11/2016 at Unknown time  . ferrous gluconate (FERGON) 324 MG tablet Take 1 tablet (324 mg total) by mouth 2 (two) times daily with a meal. 60 tablet 6 01/11/2016 at Unknown time  . furosemide (LASIX) 20 MG tablet Take 1 tablet (20 mg total) by mouth as needed for edema (weight  gain). 30 tablet 3 Past Month at Unknown time  . levothyroxine (SYNTHROID, LEVOTHROID) 200 MCG tablet Take one 200 mcg daily before breakfast every day with 1 1/2 (300 mcg) every Monday. (Patient taking differently: Take 200-300 mcg by mouth daily before breakfast. Take 1 tablet (273mcg) all days except on Mondays take 1&1/2 tablets (324mcg)) 45 tablet 6 01/11/2016 at Unknown time  . metoprolol succinate (TOPROL-XL) 25 MG 24 hr tablet Take 1 tablet (25 mg total) by mouth 2 (two) times daily. 60 tablet 3 01/11/2016 at 0700  . Multiple Vitamins-Minerals (ICAPS PO) Take 1 tablet by mouth 2 (two) times daily.   01/11/2016 at Unknown time  . nitroGLYCERIN (NITROSTAT) 0.4 MG SL tablet Place 0.4 mg under the tongue every 5 (five) minutes as needed for chest pain.   unknown  . pantoprazole (PROTONIX) 40 MG tablet TAKE 1 TABLET EVERY 12 HOURS. 60 tablet 3 01/11/2016 at Unknown time  . promethazine (PHENERGAN) 12.5 MG tablet Take 12.5 mg by mouth every 6 (six) hours as needed for nausea or vomiting.    Past Month at Unknown time  . warfarin (COUMADIN) 2 MG tablet Take 2-4 mg by mouth daily. 4mg  on Mondays, 2mg  all other days   01/08/2016  . zinc gluconate 50 MG tablet Take 50 mg by mouth 2 (two) times daily.    01/11/2016 at Unknown time  . clopidogrel (PLAVIX) 75 MG tablet Take 1 tablet (75 mg total) by mouth daily. (Patient not taking: Reported on 01/09/2016) 30 tablet 6 Not Taking at Unknown time  . RANEXA 500 MG 12 hr tablet TAKE 1 TABLET (500 MG TOTAL) BY MOUTH 2 (TWO) TIMES DAILY. (Patient not taking: Reported on 01/09/2016) 60 tablet 6 Not Taking at Unknown time   Scheduled:  . amLODipine  10 mg Oral Daily  . [START ON 01/12/2016] atorvastatin  80 mg Oral q1800  . cefUROXime (ZINACEF)  IV  1.5 g Intravenous 30 min Pre-Op  . Chlorhexidine Gluconate Cloth  6 each Topical Once   And  . Chlorhexidine Gluconate Cloth  6 each Topical Once  . [START ON 01/12/2016] ferrous gluconate  324 mg Oral BID WC  . [START ON 01/12/2016] levothyroxine  200 mcg Oral QAC breakfast  . metoprolol      . metoprolol succinate  25 mg Oral BID  . pantoprazole  40 mg Oral BID  . ranolazine  500 mg Oral BID   Infusions:  . sodium chloride      Assessment: 75yo male with LVAD presents for carotid surgery scheduled for 3/10. Pharmacy is consulted to dose heparin when INR < 2.0.   Goal of Therapy:  Heparin level 0.3-0.7 units/ml Monitor platelets by anticoagulation protocol: Yes   Plan:  Hold heparin Daily INR Continue to monitor H&H and platelets  Andrey Cota. Diona Foley, PharmD, [place] Pharmacist Pager [phone] 01/11/2016,8:20 PM

## 2016-01-12 ENCOUNTER — Encounter (HOSPITAL_COMMUNITY): Payer: Self-pay

## 2016-01-12 LAB — CBC WITH DIFFERENTIAL/PLATELET
BASOS ABS: 0 10*3/uL (ref 0.0–0.1)
BASOS PCT: 1 %
EOS ABS: 0.2 10*3/uL (ref 0.0–0.7)
EOS PCT: 3 %
HCT: 28.8 % — ABNORMAL LOW (ref 39.0–52.0)
HEMOGLOBIN: 9.1 g/dL — AB (ref 13.0–17.0)
LYMPHS ABS: 0.8 10*3/uL (ref 0.7–4.0)
Lymphocytes Relative: 15 %
MCH: 29.5 pg (ref 26.0–34.0)
MCHC: 31.6 g/dL (ref 30.0–36.0)
MCV: 93.5 fL (ref 78.0–100.0)
Monocytes Absolute: 0.6 10*3/uL (ref 0.1–1.0)
Monocytes Relative: 13 %
NEUTROS PCT: 68 %
Neutro Abs: 3.5 10*3/uL (ref 1.7–7.7)
Platelets: 136 10*3/uL — ABNORMAL LOW (ref 150–400)
RBC: 3.08 MIL/uL — AB (ref 4.22–5.81)
RDW: 17.4 % — AB (ref 11.5–15.5)
WBC: 5.1 10*3/uL (ref 4.0–10.5)

## 2016-01-12 LAB — LACTATE DEHYDROGENASE: LDH: 258 U/L — ABNORMAL HIGH (ref 98–192)

## 2016-01-12 LAB — PROTIME-INR
INR: 1.98 — AB (ref 0.00–1.49)
INR: 1.88 — ABNORMAL HIGH (ref 0.00–1.49)
PROTHROMBIN TIME: 22.4 s — AB (ref 11.6–15.2)
Prothrombin Time: 21.5 seconds — ABNORMAL HIGH (ref 11.6–15.2)

## 2016-01-12 MED ORDER — METOPROLOL SUCCINATE ER 25 MG PO TB24
50.0000 mg | ORAL_TABLET | Freq: Two times a day (BID) | ORAL | Status: DC
  Administered 2016-01-12 – 2016-01-17 (×7): 50 mg via ORAL
  Filled 2016-01-12 (×6): qty 2
  Filled 2016-01-12: qty 1
  Filled 2016-01-12 (×4): qty 2

## 2016-01-12 NOTE — Care Management Note (Signed)
Case Management Note  Patient Details  Name: [name] MRN: [id number] Date of Birth: [birth year]  Subjective/Objective:       Adm for heparin before card surg, at fib             Action/Plan: lives w wife, pcp dr Kandice Robinsons   Expected Discharge Date:                  Expected Discharge Plan:  [place]  In-House Referral:     Discharge planning Services  CM Consult, [facility] Clinic  Post Acute Care Choice:    Choice offered to:     DME Arranged:    DME Agency:     HH Arranged:    HH Agency:     Status of Service:     Medicare Important Message Given:    Date Medicare IM Given:    Medicare IM give by:    Date Additional Medicare IM Given:    Additional Medicare Important Message give by:     If discussed at [place] of Stay Meetings, dates discussed:    Additional Comments:ur review done  Lacretia Leigh, RN 01/12/2016, 7:20 AM

## 2016-01-12 NOTE — Progress Notes (Signed)
HeartMate 2 Rounding Note  Subjective:    Scheduled for carotid surgery 01/13/16  Afib Rate in 120s-150s this am. Pt states his HR has been up for the past 4-6 weeks. Has been feeling fine. Denies any bleeding. No SOB.   Lost a few minutes last night due to RN disconnecting driveline from power source.    LVAD INTERROGATION:  HeartMate II LVAD:  Flow 3.4 liters/min, speed 9000, power 4.4, PI 4.0. Low Flow/Voltage alerts last night with controller change.    Objective:    Vital Signs:   Temp:  [97.6 F (36.4 C)-98 F (36.7 C)] 98 F (36.7 C) (03/09 0801) Pulse Rate:  [129-154] 129 (03/08 2100) Resp:  [18] 18 (03/09 0801) SpO2:  [91 %-93 %] 93 % (03/08 2100) Weight:  [191 lb 3.2 oz (86.728 kg)] 191 lb 3.2 oz (86.728 kg) (03/09 0300) Last BM Date:  (pta) Mean arterial Pressure 70s  Intake/Output:   Intake/Output Summary (Last 24 hours) at 01/12/16 0857 Last data filed at 01/12/16 0700  Gross per 24 hour  Intake  149.5 ml  Output    225 ml  Net  -75.5 ml     Physical Exam: General:  Well appearing. No resp difficulty HEENT: normal Neck: supple. JVP 7-8. Carotids 2+ bilat; no bruits. No thyromegaly or nodule noted Cor: Mechanical heart sounds with LVAD hum present. Lungs: CTAB, normal effort Abdomen: soft, NT, ND, no HSM. No bruits or masses. +BS . Driveline: C/D/I; securement device intact and driveline incorporated Extremities: no cyanosis, clubbing, rash, Trace lower leg edema Neuro: alert & orientedx3, cranial nerves grossly intact. moves all 4 extremities w/o difficulty. Affect pleasant  Telemetry: Reviewed personally, Afib 130s  Labs: Basic Metabolic Panel:  Recent Labs Lab 01/11/16 1953  NA 142  K 4.1  CL 110  CO2 23  GLUCOSE 108*  BUN 20  CREATININE 1.59*  CALCIUM 8.9    Liver Function Tests:  Recent Labs Lab 01/11/16 1953  AST 30  ALT 20  ALKPHOS 107  BILITOT 0.4  PROT 6.6  ALBUMIN 3.4*   No results for input(s): LIPASE, AMYLASE in the  last 168 hours. No results for input(s): AMMONIA in the last 168 hours.  CBC:  Recent Labs Lab 01/11/16 1953 01/12/16 0251  WBC 6.5 5.1  NEUTROABS  --  3.5  HGB 10.5* 9.1*  HCT 32.2* 28.8*  MCV 93.3 93.5  PLT 171 136*    INR:  Recent Labs Lab 01/06/16 01/11/16 1953 01/12/16 0251  INR 2.4 2.00* 1.98*    Other results:    Imaging:  No results found.   Medications:     Scheduled Medications: . amLODipine  10 mg Oral Daily  . atorvastatin  80 mg Oral q1800  . [START ON 01/13/2016] cefUROXime (ZINACEF)  IV  1.5 g Intravenous To SS-Surg  . Chlorhexidine Gluconate Cloth  6 each Topical Once   And  . Chlorhexidine Gluconate Cloth  6 each Topical Once  . diltiazem (CARDIZEM) infusion      . ferrous gluconate  324 mg Oral BID WC  . levothyroxine  200 mcg Oral QAC breakfast  . metoprolol succinate  25 mg Oral BID  . pantoprazole  40 mg Oral BID  . ranolazine  500 mg Oral BID     Infusions: . sodium chloride 10 mL/hr at 01/11/16 2100  . sodium chloride Stopped (01/11/16 2334)  . diltiazem (CARDIZEM) infusion 5 mg/hr (01/11/16 2106)     PRN Medications:  promethazine  Assessment:   1. High-grade R internal carotid stenosis 2. Chronic systolic HF: s/p LVAD 123XX123. Remains NYHA II-III.  3. Atrial arrhythmias: Atrial fibrillation, atrial tachycardia. S/p DC-CV 06/04/14 and repeat DC-CV 03/02/15. 4. HTN 5. LVAD 6. CKD, stage III 7. Anticoagulation management 8. Nephrolithiasis s/p Renal Stone lithotripsy: Per Dr Risa Grill. Stable. 9. 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 Now off Plavix and warfarin.  10. Lumbar compression fracture: This is major limiting factor for him. Follows with Dr. Vertell Limber. Pending surgery once recovered from carotid surgery 11. CVA:   Plan/Discussion:    Off coumadin for surgery. Being covered with heparin.   Has had recurrent AF. Cannot use amio due to thyrotoxicity. Unable to DC-CV due to need to stop coumadin.  Continue Toprol 25 bid for rate control. Will discuss increasing dilt drip with MD.   INR 1.98 this am.  Will check later today to consider starting heparin (for INR <1.8)  I reviewed the LVAD parameters from today, and compared the results to the patient's prior recorded data.  No programming changes were made.  The LVAD is functioning within specified parameters.  The patient performs LVAD self-test daily.  LVAD interrogation was negative for any significant power changes, alarms or PI events/speed drops.  LVAD equipment check completed and is in good working order.  Back-up equipment present.   LVAD education done on emergency procedures and precautions and reviewed exit site care.  Length of Stay: 1  Annamaria Helling 01/12/2016, 8:57 AM  VAD Team --- VAD ISSUES ONLY--- Pager [phone] (7am - 7am)  Advanced Heart Failure Team  Pager [phone] (M-F; 7a - 4p)  Please contact [facility] for night-coverage after hours (4p -7a ) and weekends on [email or web address]   Patient seen and examined with Oda Kilts, PA-C. We discussed all aspects of the encounter. I agree with the assessment and plan as stated above.   He has recurrent atrial tach with RVR. Will increase diltiazem dose. Will start heparin today. Plan CEA tomorrow. Volume status and renal function ok. VAD parameters stable.   Henderson Frampton,MD 10:40 AM

## 2016-01-13 ENCOUNTER — Encounter (HOSPITAL_COMMUNITY)
Admission: RE | Disposition: A | Discharge status: Home / Self Care | Payer: Self-pay | Source: Ambulatory Visit | Attending: Internal Medicine

## 2016-01-13 ENCOUNTER — Inpatient Hospital Stay (HOSPITAL_COMMUNITY): Payer: Medicare Other | Admitting: Vascular Surgery

## 2016-01-13 ENCOUNTER — Inpatient Hospital Stay (HOSPITAL_COMMUNITY): Admission: RE | Admit: 2016-01-13 | Payer: Medicare Other | Source: Ambulatory Visit | Admitting: Vascular Surgery

## 2016-01-13 DIAGNOSIS — I5023 Acute on chronic systolic (congestive) heart failure: Secondary | ICD-10-CM

## 2016-01-13 DIAGNOSIS — I4891 Unspecified atrial fibrillation: Secondary | ICD-10-CM

## 2016-01-13 DIAGNOSIS — I6521 Occlusion and stenosis of right carotid artery: Secondary | ICD-10-CM

## 2016-01-13 HISTORY — PX: PATCH ANGIOPLASTY: SHX6230

## 2016-01-13 HISTORY — PX: ENDARTERECTOMY: SHX5162

## 2016-01-13 LAB — CBC WITH DIFFERENTIAL/PLATELET
BASOS ABS: 0 10*3/uL (ref 0.0–0.1)
BASOS PCT: 1 %
Eosinophils Absolute: 0.2 10*3/uL (ref 0.0–0.7)
Eosinophils Relative: 4 %
HEMATOCRIT: 31.5 % — AB (ref 39.0–52.0)
HEMOGLOBIN: 9.8 g/dL — AB (ref 13.0–17.0)
LYMPHS PCT: 12 %
Lymphs Abs: 0.7 10*3/uL (ref 0.7–4.0)
MCH: 28.8 pg (ref 26.0–34.0)
MCHC: 31.1 g/dL (ref 30.0–36.0)
MCV: 92.6 fL (ref 78.0–100.0)
MONO ABS: 0.9 10*3/uL (ref 0.1–1.0)
Monocytes Relative: 15 %
NEUTROS ABS: 4.2 10*3/uL (ref 1.7–7.7)
NEUTROS PCT: 68 %
PLATELETS: 161 10*3/uL (ref 150–400)
RBC: 3.4 MIL/uL — AB (ref 4.22–5.81)
RDW: 17 % — AB (ref 11.5–15.5)
WBC: 6 10*3/uL (ref 4.0–10.5)

## 2016-01-13 LAB — BASIC METABOLIC PANEL
ANION GAP: 8 (ref 5–15)
BUN: 19 mg/dL (ref 6–20)
CALCIUM: 8.8 mg/dL — AB (ref 8.9–10.3)
CO2: 22 mmol/L (ref 22–32)
CREATININE: 1.44 mg/dL — AB (ref 0.61–1.24)
Chloride: 110 mmol/L (ref 101–111)
GFR calc Af Amer: 53 mL/min — ABNORMAL LOW (ref >60)
GFR, EST NON AFRICAN AMERICAN: 46 mL/min — AB (ref >60)
GLUCOSE: 112 mg/dL — AB (ref 65–99)
Potassium: 4.2 mmol/L (ref 3.5–5.1)
Sodium: 140 mmol/L (ref 135–145)

## 2016-01-13 LAB — PROTIME-INR
INR: 1.91 — AB (ref 0.00–1.49)
PROTHROMBIN TIME: 21.8 s — AB (ref 11.6–15.2)

## 2016-01-13 LAB — PREPARE RBC (CROSSMATCH)

## 2016-01-13 LAB — LACTATE DEHYDROGENASE: LDH: 224 U/L — AB (ref 98–192)

## 2016-01-13 LAB — SURGICAL PCR SCREEN
MRSA, PCR: NEGATIVE
Staphylococcus aureus: NEGATIVE

## 2016-01-13 SURGERY — ENDARTERECTOMY, CAROTID
Anesthesia: General | Site: Neck | Laterality: Right

## 2016-01-13 MED ORDER — LIDOCAINE HCL (PF) 1 % IJ SOLN
INTRAMUSCULAR | Status: AC
  Filled 2016-01-13: qty 30

## 2016-01-13 MED ORDER — FENTANYL CITRATE (PF) 250 MCG/5ML IJ SOLN
INTRAMUSCULAR | Status: AC
  Filled 2016-01-13: qty 5

## 2016-01-13 MED ORDER — ONDANSETRON HCL 4 MG/2ML IJ SOLN
INTRAMUSCULAR | Status: DC | PRN
  Administered 2016-01-13: 4 mg via INTRAVENOUS

## 2016-01-13 MED ORDER — SODIUM CHLORIDE 0.9 % IV SOLN
0.0125 ug/kg/min | INTRAVENOUS | Status: AC
  Administered 2016-01-13: .2 ug/kg/min via INTRAVENOUS
  Filled 2016-01-13: qty 2000

## 2016-01-13 MED ORDER — AMIODARONE LOAD VIA INFUSION
150.0000 mg | Freq: Once | INTRAVENOUS | Status: AC
  Administered 2016-01-13: 150 mg via INTRAVENOUS
  Filled 2016-01-13: qty 83.34

## 2016-01-13 MED ORDER — 0.9 % SODIUM CHLORIDE (POUR BTL) OPTIME
TOPICAL | Status: DC | PRN
Start: 2016-01-13 — End: 2016-01-13
  Administered 2016-01-13: 2000 mL

## 2016-01-13 MED ORDER — ALBUMIN HUMAN 5 % IV SOLN
INTRAVENOUS | Status: DC | PRN
  Administered 2016-01-13: 08:00:00 via INTRAVENOUS

## 2016-01-13 MED ORDER — THROMBIN 20000 UNITS EX SOLR
CUTANEOUS | Status: DC | PRN
  Administered 2016-01-13: 20000 [IU] via TOPICAL

## 2016-01-13 MED ORDER — LIDOCAINE HCL (CARDIAC) 20 MG/ML IV SOLN
INTRAVENOUS | Status: DC | PRN
  Administered 2016-01-13: 40 mg via INTRAVENOUS
  Administered 2016-01-13: 100 mg via INTRATRACHEAL

## 2016-01-13 MED ORDER — MIDAZOLAM HCL 2 MG/2ML IJ SOLN
INTRAMUSCULAR | Status: AC
  Filled 2016-01-13: qty 2

## 2016-01-13 MED ORDER — MORPHINE SULFATE (PF) 2 MG/ML IV SOLN: 1.0000 mg | INTRAVENOUS | Status: DC | PRN

## 2016-01-13 MED ORDER — NOREPINEPHRINE BITARTRATE 1 MG/ML IV SOLN
0.0000 ug/min | INTRAVENOUS | Status: DC
  Filled 2016-01-13: qty 4

## 2016-01-13 MED ORDER — THROMBIN 20000 UNITS EX SOLR
CUTANEOUS | Status: AC
  Filled 2016-01-13: qty 20000

## 2016-01-13 MED ORDER — THROMBIN 20000 UNITS EX SOLR: CUTANEOUS | Status: DC | PRN

## 2016-01-13 MED ORDER — SUGAMMADEX SODIUM 200 MG/2ML IV SOLN
INTRAVENOUS | Status: AC
  Filled 2016-01-13: qty 2

## 2016-01-13 MED ORDER — SUGAMMADEX SODIUM 200 MG/2ML IV SOLN
INTRAVENOUS | Status: DC | PRN
  Administered 2016-01-13: 180 mg via INTRAVENOUS

## 2016-01-13 MED ORDER — SUCCINYLCHOLINE CHLORIDE 20 MG/ML IJ SOLN
INTRAMUSCULAR | Status: AC
  Filled 2016-01-13: qty 1

## 2016-01-13 MED ORDER — NOREPINEPHRINE BITARTRATE 1 MG/ML IV SOLN
4000.0000 ug | INTRAVENOUS | Status: DC | PRN
  Administered 2016-01-13: 2 ug/min via INTRAVENOUS

## 2016-01-13 MED ORDER — LACTATED RINGERS IV SOLN
INTRAVENOUS | Status: DC | PRN
  Administered 2016-01-13: 07:00:00 via INTRAVENOUS

## 2016-01-13 MED ORDER — FUROSEMIDE 10 MG/ML IJ SOLN: 40.0000 mg | Freq: Once | INTRAMUSCULAR | Status: DC

## 2016-01-13 MED ORDER — ALBUTEROL SULFATE HFA 108 (90 BASE) MCG/ACT IN AERS
INHALATION_SPRAY | RESPIRATORY_TRACT | Status: DC | PRN
  Administered 2016-01-13: 2 via RESPIRATORY_TRACT

## 2016-01-13 MED ORDER — ACETAMINOPHEN 325 MG PO TABS: 325.0000 mg | ORAL_TABLET | ORAL | Status: DC | PRN

## 2016-01-13 MED ORDER — OXYCODONE-ACETAMINOPHEN 5-325 MG PO TABS: 1.0000 | ORAL_TABLET | ORAL | Status: DC | PRN

## 2016-01-13 MED ORDER — METOPROLOL TARTRATE 1 MG/ML IV SOLN
INTRAVENOUS | Status: AC
  Filled 2016-01-13: qty 5

## 2016-01-13 MED ORDER — ACETAMINOPHEN 160 MG/5ML PO SOLN
325.0000 mg | ORAL | Status: DC | PRN
  Filled 2016-01-13: qty 20.3

## 2016-01-13 MED ORDER — PROTAMINE SULFATE 10 MG/ML IV SOLN
INTRAVENOUS | Status: AC
  Filled 2016-01-13: qty 5

## 2016-01-13 MED ORDER — SODIUM CHLORIDE 0.9 % IV SOLN: Freq: Once | INTRAVENOUS | Status: DC

## 2016-01-13 MED ORDER — DEXTROSE 5 % IV SOLN
0.0000 ug/min | INTRAVENOUS | Status: DC
  Filled 2016-01-13: qty 1

## 2016-01-13 MED ORDER — SODIUM CHLORIDE 0.9% FLUSH
3.0000 mL | Freq: Two times a day (BID) | INTRAVENOUS | Status: DC
  Administered 2016-01-14 – 2016-01-20 (×8): 3 mL via INTRAVENOUS

## 2016-01-13 MED ORDER — AMIODARONE HCL IN DEXTROSE 360-4.14 MG/200ML-% IV SOLN
60.0000 mg/h | INTRAVENOUS | Status: AC
  Administered 2016-01-13 (×2): 60 mg/h via INTRAVENOUS
  Filled 2016-01-13: qty 200

## 2016-01-13 MED ORDER — HEPARIN SODIUM (PORCINE) 1000 UNIT/ML IJ SOLN
INTRAMUSCULAR | Status: DC | PRN
  Administered 2016-01-13: 5000 [IU] via INTRAVENOUS

## 2016-01-13 MED ORDER — METOPROLOL TARTRATE 1 MG/ML IV SOLN
INTRAVENOUS | Status: DC | PRN
  Administered 2016-01-13: 2.5 mg via INTRAVENOUS

## 2016-01-13 MED ORDER — PROPOFOL 10 MG/ML IV BOLUS
INTRAVENOUS | Status: AC
  Filled 2016-01-13: qty 20

## 2016-01-13 MED ORDER — HEPARIN SODIUM (PORCINE) 1000 UNIT/ML IJ SOLN
INTRAMUSCULAR | Status: AC
  Filled 2016-01-13: qty 1

## 2016-01-13 MED ORDER — AMIODARONE HCL IN DEXTROSE 360-4.14 MG/200ML-% IV SOLN
30.0000 mg/h | INTRAVENOUS | Status: DC
  Administered 2016-01-14 – 2016-01-19 (×11): 30 mg/h via INTRAVENOUS
  Filled 2016-01-13 (×14): qty 200

## 2016-01-13 MED ORDER — SODIUM CHLORIDE 0.9 % IV SOLN
INTRAVENOUS | Status: DC | PRN
  Administered 2016-01-13: 500 mL

## 2016-01-13 MED ORDER — OXYCODONE HCL 5 MG/5ML PO SOLN: 5.0000 mg | Freq: Once | ORAL | Status: DC | PRN

## 2016-01-13 MED ORDER — FENTANYL CITRATE (PF) 100 MCG/2ML IJ SOLN: 25.0000 ug | INTRAMUSCULAR | Status: DC | PRN

## 2016-01-13 MED ORDER — OXYCODONE HCL 5 MG PO TABS: 5.0000 mg | ORAL_TABLET | Freq: Once | ORAL | Status: DC | PRN

## 2016-01-13 MED ORDER — PHENYLEPHRINE HCL 10 MG/ML IJ SOLN
10.0000 mg | INTRAVENOUS | Status: DC | PRN
  Administered 2016-01-13: 50 ug/min via INTRAVENOUS

## 2016-01-13 MED ORDER — ROCURONIUM BROMIDE 50 MG/5ML IV SOLN
INTRAVENOUS | Status: AC
Start: 2016-01-13 — End: 2016-01-13
  Filled 2016-01-13: qty 1

## 2016-01-13 MED ORDER — SODIUM CHLORIDE 0.9% FLUSH: 3.0000 mL | INTRAVENOUS | Status: DC | PRN

## 2016-01-13 MED ORDER — ONDANSETRON HCL 4 MG/2ML IJ SOLN
4.0000 mg | Freq: Four times a day (QID) | INTRAMUSCULAR | Status: DC | PRN
  Administered 2016-01-18: 4 mg via INTRAVENOUS
  Filled 2016-01-13: qty 2

## 2016-01-13 MED ORDER — LIDOCAINE HCL (CARDIAC) 20 MG/ML IV SOLN
INTRAVENOUS | Status: AC
  Filled 2016-01-13: qty 10

## 2016-01-13 MED ORDER — ETOMIDATE 2 MG/ML IV SOLN
INTRAVENOUS | Status: DC | PRN
  Administered 2016-01-13: 12 mg via INTRAVENOUS
  Administered 2016-01-13: 8 mg via INTRAVENOUS

## 2016-01-13 MED ORDER — ROCURONIUM BROMIDE 100 MG/10ML IV SOLN
INTRAVENOUS | Status: DC | PRN
  Administered 2016-01-13: 50 mg via INTRAVENOUS

## 2016-01-13 MED ORDER — DEXTROSE 5 % IV SOLN
1.5000 g | Freq: Two times a day (BID) | INTRAVENOUS | Status: AC
  Administered 2016-01-13 – 2016-01-14 (×2): 1.5 g via INTRAVENOUS
  Filled 2016-01-13 (×3): qty 1.5

## 2016-01-13 SURGICAL SUPPLY — 42 items
CANISTER SUCTION 2500CC (MISCELLANEOUS) ×2 IMPLANT
CATH ROBINSON RED A/P 18FR (CATHETERS) ×2 IMPLANT
CATH SUCT 10FR WHISTLE TIP (CATHETERS) ×2 IMPLANT
CLIP TI MEDIUM 24 (CLIP) ×2 IMPLANT
CLIP TI WIDE RED SMALL 24 (CLIP) ×2 IMPLANT
CRADLE DONUT ADULT HEAD (MISCELLANEOUS) ×2 IMPLANT
DECANTER SPIKE VIAL GLASS SM (MISCELLANEOUS) IMPLANT
DRAIN HEMOVAC 1/8 X 5 (WOUND CARE) ×2 IMPLANT
DRSG COVADERM 4X8 (GAUZE/BANDAGES/DRESSINGS) ×2 IMPLANT
ELECT REM PT RETURN 9FT ADLT (ELECTROSURGICAL) ×2
ELECTRODE REM PT RTRN 9FT ADLT (ELECTROSURGICAL) ×1 IMPLANT
EVACUATOR SILICONE 100CC (DRAIN) ×2 IMPLANT
GAUZE SPONGE 4X4 12PLY STRL (GAUZE/BANDAGES/DRESSINGS) ×2 IMPLANT
GLOVE BIO SURGEON STRL SZ 6.5 (GLOVE) ×4 IMPLANT
GLOVE BIOGEL PI IND STRL 6.5 (GLOVE) ×4 IMPLANT
GLOVE BIOGEL PI INDICATOR 6.5 (GLOVE) ×4
GLOVE ECLIPSE 6.5 STRL STRAW (GLOVE) ×4 IMPLANT
GLOVE SS BIOGEL STRL SZ 7 (GLOVE) ×1 IMPLANT
GLOVE SUPERSENSE BIOGEL SZ 7 (GLOVE) ×1
GOWN STRL REUS W/ TWL LRG LVL3 (GOWN DISPOSABLE) ×4 IMPLANT
GOWN STRL REUS W/TWL LRG LVL3 (GOWN DISPOSABLE) ×8
INSERT FOGARTY SM (MISCELLANEOUS) ×2 IMPLANT
KIT BASIN OR (CUSTOM PROCEDURE TRAY) ×2 IMPLANT
KIT ROOM TURNOVER OR (KITS) ×2 IMPLANT
NEEDLE 22X1 1/2 (OR ONLY) (NEEDLE) IMPLANT
NS IRRIG 1000ML POUR BTL (IV SOLUTION) ×4 IMPLANT
PACK CAROTID (CUSTOM PROCEDURE TRAY) ×2 IMPLANT
PAD ARMBOARD 7.5X6 YLW CONV (MISCELLANEOUS) ×4 IMPLANT
PAD ELECT DEFIB RADIOL ZOLL (MISCELLANEOUS) ×2 IMPLANT
PATCH HEMASHIELD 8X75 (Vascular Products) ×2 IMPLANT
SHUNT CAROTID BYPASS 12FRX15.5 (VASCULAR PRODUCTS) IMPLANT
SPONGE INTESTINAL PEANUT (DISPOSABLE) IMPLANT
SUT ETHILON 3 0 PS 1 (SUTURE) ×2 IMPLANT
SUT PROLENE 6 0 CC (SUTURE) ×2 IMPLANT
SUT SILK 2 0 FS (SUTURE) ×2 IMPLANT
SUT SILK 3 0 TIES 17X18 (SUTURE)
SUT SILK 3-0 18XBRD TIE BLK (SUTURE) IMPLANT
SUT VIC AB 2-0 CT1 27 (SUTURE) ×2
SUT VIC AB 2-0 CT1 TAPERPNT 27 (SUTURE) ×1 IMPLANT
SUT VIC AB 3-0 X1 27 (SUTURE) ×2 IMPLANT
SYR CONTROL 10ML LL (SYRINGE) IMPLANT
WATER STERILE IRR 1000ML POUR (IV SOLUTION) ×2 IMPLANT

## 2016-01-13 NOTE — Progress Notes (Signed)
Neo drip down to 40

## 2016-01-13 NOTE — Anesthesia Preprocedure Evaluation (Signed)
Anesthesia Evaluation  Patient identified by MRN, date of birth, ID band Patient awake    Reviewed: Allergy & Precautions, NPO status , Patient's Chart, lab work & pertinent test results  History of Anesthesia Complications (+) AWARENESS UNDER ANESTHESIA  Airway Mallampati: II  TM Distance: >3 FB Neck ROM: Full    Dental  (+) Edentulous Upper, Edentulous Lower   Pulmonary shortness of breath, sleep apnea , former smoker,    breath sounds clear to auscultation       Cardiovascular hypertension, + angina + CAD, + Past MI, + Peripheral Vascular Disease and +CHF  + dysrhythmias Atrial Fibrillation + Cardiac Defibrillator  Rhythm:Irregular  VAD hum   Neuro/Psych TIACVA, No Residual Symptoms negative psych ROS   GI/Hepatic Neg liver ROS, Question gi bleed   Endo/Other  Hypothyroidism   Renal/GU Renal disease     Musculoskeletal  (+) Arthritis ,   Abdominal   Peds  Hematology  (+) anemia ,   Anesthesia Other Findings   Reproductive/Obstetrics                             Anesthesia Physical Anesthesia Plan  ASA: IV  Anesthesia Plan: General   Post-op Pain Management:    Induction: Intravenous  Airway Management Planned: Oral ETT  Additional Equipment: Arterial line  Intra-op Plan:   Post-operative Plan: Extubation in OR and Possible Post-op intubation/ventilation  Informed Consent: I have reviewed the patients History and Physical, chart, labs and discussed the procedure including the risks, benefits and alternatives for the proposed anesthesia with the patient or authorized representative who has indicated his/her understanding and acceptance.   Dental advisory given  Plan Discussed with: CRNA and Surgeon  Anesthesia Plan Comments:         Anesthesia Quick Evaluation

## 2016-01-13 NOTE — Progress Notes (Signed)
Dr. [name] updated . One unit RBC infused. Mean pressure > 80. Mild expiratory wheezes. Good cough effort. Beginning to wean Neo. At 60 mcg/kg reduced to 50 mcg/kg

## 2016-01-13 NOTE — Op Note (Signed)
OPERATIVE REPORT  Date of Surgery: 01/11/2016 - 01/13/2016  Surgeon: Tinnie Gens, MD  Assistant: Silva Bandy PA  Pre-op Diagnosis: Right internal carotid artery stenosis I65.21,with right brain transient ischemic attack  Post-op Diagnosis: Right internal carotid artery stenosis I65.21,with right brain transient ischemic attack  Procedure: Procedure(s): RIGHT CAROTID ARTERY ENDARTERECTOMY WITH  PATCH ANGIOPLASTY  Anesthesia: General  EBL: 123XX123 cc  Complications: None  Procedure Details: The patient was taken to the operating room and placed in the supine position. Following induction of satisfactory general endotracheal anesthesia the right neck was prepped and draped in a routine sterile manner. Incision was made on the anterior border of the sternocleidomastoid muscle and carried down through the subcutaneous tissue and platysma using the Bovie. Care was taken not to injure the hypoglossal nerve.. The common internal and external carotid arteries were dissected free. There was a calcified atherosclerotic plaque at the carotid bifurcation extending up the internal carotid artery. A #10 shunt was then prepared and the patient was heparinized. The carotid vessels were occluded with vascular clamps. A longitudinal opening was made in the common carotid with a 15 blade extended up the internal carotid with the Potts scissors to a point distal to the disease. The plaque was approximately 80% stenotic in severity. The distal vessel appeared normal. Shunt was inserted without difficulty reestablishing flow in about 2 minutes. A standard endarterectomy was performed with an eversion endarterectomy of the external carotid. The plaque feathered off  the distal internal carotid artery nicely not requiring any tacking sutures. The lumen was thoroughly irrigated with heparinized saline and loose debris all carefully removed. The arterotomy was then closed with a patch using continuous 6-0 Prolene. Prior to  completion of the  Closure the  shunt was removed after approximately 30 minutes of shunt time. Flow was then reestablished up the external branch initially followed by the internal branch. Protamine Was not given to her reverse the heparin Due to patient having an LVAD. A Jackson-Pratt drain was brought out through an inferiorly based stab wound and secured with a silk suture..Following adequate hemostasis the wound was irrigated with saline and closed in layers with Vicryl ain a subcuticular fashion. Sterile dressing was applied and the patient taken to the recovery room in stable condition.  Tinnie Gens, MD 01/13/2016 10:05 AM

## 2016-01-13 NOTE — Progress Notes (Signed)
  Amiodarone Drug - Drug Interaction Consult Note  Recommendations: 1. Monitor for muscle pain and weakness on both atorvastatin and amiodarone. 2. Amiodarone can increase the anticoagulant effect of warfarin - monitor INR closely and for signs and symptoms of bleeding when warfarin/anticoagulation is resumed.  3. Monitor for bradycardia on both metoprolol and amiodarone.  4. If Lasix is resumed, monitor for hypokalemia on both therapies.   Amiodarone is metabolized by the cytochrome P450 system and therefore has the potential to cause many drug interactions. Amiodarone has an average plasma half-life of 50 days (range 20 to 100 days).   There is potential for drug interactions to occur several weeks or months after stopping treatment and the onset of drug interactions may be slow after initiating amiodarone.   [x]  Statins: Increased risk of myopathy. Simvastatin- restrict dose to 20mg  daily. Other statins: counsel patients to report any muscle pain or weakness immediately.  [x]  Anticoagulants: Amiodarone can increase anticoagulant effect. Consider warfarin  dose reduction. Patients should be monitored closely and the dose of anticoagulant  altered accordingly, remembering that amiodarone levels take several weeks to  stabilize. -- Patient is on warfarin chronically- currently on hold due to  surgery.   [x]  Beta blockers: increased risk of bradycardia, AV block and myocardial depression. Sotalol - avoid concomitant use.  [x]  Diuretics: increased risk of cardiotoxicity if hypokalemia occurs. - Patient is on  Lasix chronically as needed for edema.   Thank You,  Brain Hilts  01/13/2016 5:20 PM

## 2016-01-13 NOTE — Progress Notes (Signed)
Consulted with kim ok to move to 2h06

## 2016-01-13 NOTE — Progress Notes (Signed)
[facility]  Subjective  - Resting comfortablly   Objective   112 97.8 F (36.6 C) (Oral) 12 97%  Intake/Output Summary (Last 24 hours) at 01/13/16 1511 Last data filed at 01/13/16 1401  Gross per 24 hour  Intake   2292 ml  Output   1190 ml  Net   1102 ml    Right neck dressing clean and dry with out hematoma  Assessment/Planning: S/P right CEA    [name] [facility] 01/13/2016 3:11 PM --  Laboratory Lab Results:  Recent Labs  01/12/16 0251 01/13/16 0300  WBC 5.1 6.0  HGB 9.1* 9.8*  HCT 28.8* 31.5*  PLT 136* 161   BMET  Recent Labs  01/11/16 1953 01/13/16 0300  NA 142 140  K 4.1 4.2  CL 110 110  CO2 23 22  GLUCOSE 108* 112*  BUN 20 19  CREATININE 1.59* 1.44*  CALCIUM 8.9 8.8*    COAG Lab Results  Component Value Date   INR 1.91* 01/13/2016   INR 1.88* 01/12/2016   INR 1.98* 01/12/2016   No results found for: PTT

## 2016-01-13 NOTE — Progress Notes (Signed)
VAD coordinator checked patient's primary and back up controller. Both have less than 6 months left on controller internal 11V battery.  Replacement batteries as noted below:    Primary controller SN:  SQ [id number]   Exp 06/19/17   Backup controller SN:   SQ [id number]   Exp 06/19/17

## 2016-01-13 NOTE — Progress Notes (Signed)
Neo drip down to 30

## 2016-01-13 NOTE — Progress Notes (Signed)
Neo off 

## 2016-01-13 NOTE — Anesthesia Procedure Notes (Signed)
Procedure Name: Intubation Date/Time: 01/13/2016 8:13 AM Performed by: Merrilyn Puma B Pre-anesthesia Checklist: Suction available, Emergency Drugs available, Patient identified, Patient being monitored and Timeout performed Patient Re-evaluated:Patient Re-evaluated prior to inductionOxygen Delivery Method: Circle system utilized Preoxygenation: Pre-oxygenation with 100% oxygen Intubation Type: IV induction Ventilation: Mask ventilation without difficulty Laryngoscope Size: Mac and 3 Grade View: Grade I Tube type: Oral Tube size: 8.0 mm Number of attempts: 1 Airway Equipment and Method: Stylet and LTA kit utilized Placement Confirmation: ETT inserted through vocal cords under direct vision,  positive ETCO2 and breath sounds checked- equal and bilateral Secured at: 23 cm Tube secured with: Tape Dental Injury: Teeth and Oropharynx as per pre-operative assessment

## 2016-01-13 NOTE — Progress Notes (Signed)
pts map pressures up to 80 after blood started

## 2016-01-13 NOTE — Progress Notes (Signed)
Consulted with dr [name] and updated statue ok to move to [phone]

## 2016-01-13 NOTE — Transfer of Care (Signed)
Immediate Anesthesia Transfer of Care Note  Patient: [name]  Procedure(s) Performed: Procedure(s): RIGHT CAROTID ARTERY ENDARTERECTOMY (Right) WITH  PATCH ANGIOPLASTY (Right)  Patient Location: PACU  Anesthesia Type:General  Level of Consciousness: awake, alert  and oriented  Airway & Oxygen Therapy: Patient Spontanous Breathing and Patient connected to nasal cannula oxygen  Post-op Assessment: Report given to RN, Post -op Vital signs reviewed and stable and Patient moving all extremities X 4  Post vital signs: Reviewed and stable  Last Vitals:  Filed Vitals:   01/12/16 2111 01/13/16 0414  Pulse: 127   Temp:  36.6 C  Resp:      Complications: No apparent anesthesia complications

## 2016-01-13 NOTE — Progress Notes (Signed)
VAD TEAM ROUNDING NOTE  HeartMate 2 Rounding Note  Subjective:    Underwent R CEA today without complication.   Got 1u RBCs in PACU. Post-op developed respiratory distress and was given IV lasix. Out > 1L. Breathing now much improved. Weaning off neosynephrine. Remains in atrial tach/afib despite diltiazem at 10  LVAD INTERROGATION:  HeartMate II LVAD:  Flow 4.0 liters/min, speed 9000, power 4.4, PI 4.2 Occasional PI  Objective:    Vital Signs:   Temp:  [97.7 F (36.5 C)-98.6 F (37 C)] 97.8 F (36.6 C) (03/10 1400) Pulse Rate:  [58-127] 119 (03/10 1630) Resp:  [12-26] 12 (03/10 1630) SpO2:  [90 %-100 %] 94 % (03/10 1630) Arterial Line BP: (61-90)/(58-83) 85/79 mmHg (03/10 1630) Weight:  [87.272 kg (192 lb 6.4 oz)] 87.272 kg (192 lb 6.4 oz) (03/10 0500) Last BM Date: 01/12/16 Mean arterial Pressure 80s  Intake/Output:   Intake/Output Summary (Last 24 hours) at 01/13/16 1637 Last data filed at 01/13/16 1600  Gross per 24 hour  Intake   2292 ml  Output   1790 ml  Net    502 ml     Physical Exam: General:  Lying in bed. awake HEENT: normal Neck: supple. R CEA bandage JVP 9  Carotids 2+ bilat; no bruits. No thyromegaly or nodule noted Cor: Mechanical heart sounds with LVAD hum present. Lungs: CTAB, normal effort Abdomen: soft, NT, ND, no HSM. No bruits or masses. +BS . Driveline: C/D/I; securement device intact and driveline incorporated Extremities: no cyanosis, clubbing, rash, Trace -1+ lower leg edema Neuro: alert & orientedx3, cranial nerves grossly intact. moves all 4 extremities w/o difficulty. Affect pleasant  Telemetry: Reviewed personally, Afib 120s  Labs: Basic Metabolic Panel:  Recent Labs Lab 01/11/16 1953 01/13/16 0300  NA 142 140  K 4.1 4.2  CL 110 110  CO2 23 22  GLUCOSE 108* 112*  BUN 20 19  CREATININE 1.59* 1.44*  CALCIUM 8.9 8.8*    Liver Function Tests:  Recent Labs Lab 01/11/16 1953  AST 30  ALT 20  ALKPHOS 107  BILITOT 0.4   PROT 6.6  ALBUMIN 3.4*   No results for input(s): LIPASE, AMYLASE in the last 168 hours. No results for input(s): AMMONIA in the last 168 hours.  CBC:  Recent Labs Lab 01/11/16 1953 01/12/16 0251 01/13/16 0300  WBC 6.5 5.1 6.0  NEUTROABS  --  3.5 4.2  HGB 10.5* 9.1* 9.8*  HCT 32.2* 28.8* 31.5*  MCV 93.3 93.5 92.6  PLT 171 136* 161    INR:  Recent Labs Lab 01/11/16 1953 01/12/16 0251 01/12/16 1814 01/13/16 0300  INR 2.00* 1.98* 1.88* 1.91*    Other results:    Imaging: No results found.   Medications:     Scheduled Medications: . sodium chloride   Intravenous Once  . amLODipine  10 mg Oral Daily  . atorvastatin  80 mg Oral q1800  . cefUROXime (ZINACEF)  IV  1.5 g Intravenous Q12H  . ferrous gluconate  324 mg Oral BID WC  . levothyroxine  200 mcg Oral QAC breakfast  . metoprolol succinate  50 mg Oral BID  . pantoprazole  40 mg Oral BID  . ranolazine  500 mg Oral BID    Infusions: . sodium chloride Stopped (01/11/16 2334)  . diltiazem (CARDIZEM) infusion 10 mg/hr (01/13/16 1400)  . phenylephrine (NEO-SYNEPHRINE) Adult infusion      PRN Medications: morphine injection, ondansetron, oxyCODONE-acetaminophen, promethazine   Assessment:   1. High-grade R internal carotid  stenosis s/p R CE 01/13/16 2. 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  --improved with IV lasix 3. Atrial arrhythmias: Atrial fibrillation, atrial tachycardia. S/p DC-CV 06/04/14 and repeat DC-CV 03/02/15.    --remains in RVR despite metoprolol and IV dilt.      --has been off amio due to thyroid toxicity.  4. CKD, stage III 5. 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 Now off Plavix and warfarin.  6. Lumbar compression fracture: This is major limiting factor for him. Follows with Dr. Vertell Limber. Pending surgery once recovered from carotid surgery   Plan/Discussion:    He has tolerated surgery well. Had some volume overload post-op and improved with IV lasix.    AF/A tach remains fast despite dual AV node blockade. We stopped amio months ago for thyroid toxicity. Will use low dose IV amio while in hospital. Plan TEE/DC-CV prior to discharge once he is anticoagulated.   Restart heparin/coumadin 24-48 hours when op site stable.   I reviewed the LVAD parameters from today, and compared the results to the patient's prior recorded data.  No programming changes were made.  The LVAD is functioning within specified parameters.  The patient performs LVAD self-test daily.  LVAD interrogation was negative for any significant power changes, alarms or PI events/speed drops.  LVAD equipment check completed and is in good working order.  Back-up equipment present.   LVAD education done on emergency procedures and precautions and reviewed exit site care.  Length of Stay: 2  Glori Bickers MD  01/13/2016, 4:37 PM  VAD Team --- VAD ISSUES ONLY--- Pager [phone] (7am - 7am)  Advanced Heart Failure Team  Pager [phone] (M-F; 7a - 4p)  Please contact [facility] for night-coverage after hours (4p -7a ) and weekends on [email or web address]

## 2016-01-13 NOTE — Progress Notes (Signed)
Neo drip down to 15

## 2016-01-13 NOTE — Progress Notes (Signed)
pts bp map by aline and sys bp by doppler running around 60 dr [name] paged and came to bedside orders obtained and carried out neo drip started at 52mcg and bllod to be hung by dr [name]

## 2016-01-13 NOTE — Progress Notes (Signed)
Neo drip down to 50

## 2016-01-13 NOTE — Progress Notes (Signed)
VAD Coordinator Procedure Note:   Patient underwent right carotid endarterectomy per Dr. Kellie Simmering. Hemodynamics and VAD parameters monitored by anesthesia and VAD coordinator throughout the procedure. MAPs were obtained with arterial line.       Doppler A line:  Flow: PI: Power:  Speed:                Pre-procedure: 8:00 am  90    3.7 4.2 4.5  9000 8:10     87  3.4 4.2 4.3     Sedation Induction: 8:15     84  3.2 3.5 4.3  Started volume, albumin, neo, levo 8:20     78  3.2 3.0 4.3 8:25     78  --- 2.4 4.3 8:30     87  2.9 3.4 4.2 8:40     88  3.0 3.4 4.3 8:50     87  3.1 3.6 4.2 9:00     87  3.2 3.9 4.2 9:15     88  3.1 4.0 4.3 9:30     87  3.2 4.2 4.3 9:45     85  3.3 3.8 4.4 10:00     84  3.4 3.4 4.5  Gtts off  Recovery area: 10:15   70    5.1 3.8 5.3 10:30     62  3.9 2.6 4.6    10:40     61  4.0 2.7 4.7   10:50     62  4.1 2.8 4.5  Neo 60 mcgs; PC hung 11:00     70  3.6 3.2 4.5  Report to VAD trained nurse Richardson Landry) in PACU.  Pt asleep, arouses to verbal stimuli, MOE x 4 to command.  Pt will remain in PACU until 2S bed becomes available. VAD coordinator contact info available to PACU staff; they will call/page if VAD coordinator needed to help transport to 2S.

## 2016-01-13 NOTE — Anesthesia Postprocedure Evaluation (Signed)
Anesthesia Post Note  Patient: [name]  Procedure(s) Performed: Procedure(s) (LRB): RIGHT CAROTID ARTERY ENDARTERECTOMY (Right) WITH  PATCH ANGIOPLASTY (Right)  Patient location during evaluation: PACU Anesthesia Type: General Level of consciousness: awake Pain management: pain level controlled Vital Signs Assessment: post-procedure vital signs reviewed and stable Respiratory status: spontaneous breathing Cardiovascular status: stable Postop Assessment: no signs of nausea or vomiting Anesthetic complications: no    Last Vitals:  Filed Vitals:   01/13/16 1445 01/13/16 1500  Pulse:    Temp:    Resp: 14 12    Last Pain:  Filed Vitals:   01/13/16 1510  PainSc: 2                  [name]

## 2016-01-14 DIAGNOSIS — I471 Supraventricular tachycardia: Secondary | ICD-10-CM

## 2016-01-14 LAB — CBC WITH DIFFERENTIAL/PLATELET
BASOS PCT: 0 %
Basophils Absolute: 0 10*3/uL (ref 0.0–0.1)
EOS ABS: 0.1 10*3/uL (ref 0.0–0.7)
EOS PCT: 2 %
HCT: 33.2 % — ABNORMAL LOW (ref 39.0–52.0)
Hemoglobin: 10.5 g/dL — ABNORMAL LOW (ref 13.0–17.0)
LYMPHS ABS: 0.9 10*3/uL (ref 0.7–4.0)
Lymphocytes Relative: 14 %
MCH: 28.6 pg (ref 26.0–34.0)
MCHC: 31.6 g/dL (ref 30.0–36.0)
MCV: 90.5 fL (ref 78.0–100.0)
Monocytes Absolute: 0.9 10*3/uL (ref 0.1–1.0)
Monocytes Relative: 13 %
NEUTROS PCT: 71 %
Neutro Abs: 4.8 10*3/uL (ref 1.7–7.7)
PLATELETS: 147 10*3/uL — AB (ref 150–400)
RBC: 3.67 MIL/uL — AB (ref 4.22–5.81)
RDW: 17.3 % — ABNORMAL HIGH (ref 11.5–15.5)
WBC: 6.8 10*3/uL (ref 4.0–10.5)

## 2016-01-14 LAB — BASIC METABOLIC PANEL
Anion gap: 10 (ref 5–15)
BUN: 15 mg/dL (ref 6–20)
CALCIUM: 8.6 mg/dL — AB (ref 8.9–10.3)
CHLORIDE: 107 mmol/L (ref 101–111)
CO2: 23 mmol/L (ref 22–32)
CREATININE: 1.35 mg/dL — AB (ref 0.61–1.24)
GFR calc Af Amer: 57 mL/min — ABNORMAL LOW (ref >60)
GFR calc non Af Amer: 49 mL/min — ABNORMAL LOW (ref >60)
Glucose, Bld: 110 mg/dL — ABNORMAL HIGH (ref 65–99)
Potassium: 3.6 mmol/L (ref 3.5–5.1)
SODIUM: 140 mmol/L (ref 135–145)

## 2016-01-14 LAB — PROTIME-INR
INR: 1.72 — ABNORMAL HIGH (ref 0.00–1.49)
PROTHROMBIN TIME: 20.1 s — AB (ref 11.6–15.2)

## 2016-01-14 LAB — LACTATE DEHYDROGENASE: LDH: 225 U/L — AB (ref 98–192)

## 2016-01-14 MED ORDER — FUROSEMIDE 10 MG/ML IJ SOLN
20.0000 mg | Freq: Once | INTRAMUSCULAR | Status: AC
  Administered 2016-01-14: 20 mg via INTRAVENOUS
  Filled 2016-01-14: qty 2

## 2016-01-14 MED ORDER — WARFARIN - PHARMACIST DOSING INPATIENT
Freq: Every day | Status: DC
  Administered 2016-01-18: 18:00:00

## 2016-01-14 MED ORDER — WARFARIN SODIUM 3 MG PO TABS
4.0000 mg | ORAL_TABLET | Freq: Once | ORAL | Status: AC
  Administered 2016-01-14: 4 mg via ORAL
  Filled 2016-01-14 (×2): qty 0.5

## 2016-01-14 NOTE — Progress Notes (Addendum)
[facility] [place]  Subjective  - Sitting up in bed side chair, comfortable.   Objective   119 98.7 F (37.1 C) (Oral) 12 95%  Intake/Output Summary (Last 24 hours) at 01/14/16 1024 Last data filed at 01/14/16 0900  Gross per 24 hour  Intake 1906.47 ml  Output   1705 ml  Net 201.47 ml    Dressing clean and dry  JP intact 150 cc output total since surgery No tongue deviation and smile is symmetrical Grip 5/5 bilateral   Assessment/Planning: POD # 1 right CEA Maintain JP drain today Plan for coumadin dose this evening current INR 1.7.  Dr. Haroldine Laws is not going to restart heparin unless INR < 1.8 tomorrow    Laurence Slate [facility] 01/14/2016 10:24 AM --  Laboratory Lab Results:  Recent Labs  01/13/16 0300 01/14/16 0412  WBC 6.0 6.8  HGB 9.8* 10.5*  HCT 31.5* 33.2*  PLT 161 147*   BMET  Recent Labs  01/13/16 0300 01/14/16 0412  NA 140 140  K 4.2 3.6  CL 110 107  CO2 22 23  GLUCOSE 112* 110*  BUN 19 15  CREATININE 1.44* 1.35*  CALCIUM 8.8* 8.6*    COAG Lab Results  Component Value Date   INR 1.72* 01/14/2016   INR 1.91* 01/13/2016   INR 1.88* 01/12/2016   No results found for: PTT    Addendum  I have independently interviewed and examined the patient, and I agree with the physician assistant's findings.  Ok to start anticoagulation.  JP out tomorrow if <50 cc/24 hr  Adele Barthel, MD [facility] [place] Office: [phone] Pager: [phone]  01/14/2016, 11:23 AM

## 2016-01-14 NOTE — Progress Notes (Signed)
VAD TEAM ROUNDING NOTE  HeartMate 2 Rounding Note  Subjective:    Underwent R CEA 99991111 without complication.   Got 1u RBCs in PACU. Post-op developed respiratory distress and was given IV lasix. Out > 1L.   Started back on amio (despite h/o amio thyroid toxicity) on 3/10 due to atrial tach with persistent RVR despite diltiazem at 10.   LVAD INTERROGATION:  HeartMate II LVAD:  Flow 4.0 liters/min, speed 9000, power 4.4, PI 4.2 No PI  Objective:    Vital Signs:   Temp:  [97.7 F (36.5 C)-98.7 F (37.1 C)] 98.7 F (37.1 C) (03/11 0800) Pulse Rate:  [58-121] 119 (03/11 0700) Resp:  [11-26] 18 (03/11 0700) SpO2:  [90 %-100 %] 95 % (03/11 0700) Arterial Line BP: (61-107)/(58-84) 107/70 mmHg (03/11 0700) Weight:  [88.8 kg (195 lb 12.3 oz)] 88.8 kg (195 lb 12.3 oz) (03/11 0600) Last BM Date: 01/12/16 Mean arterial Pressure 80s  Intake/Output:   Intake/Output Summary (Last 24 hours) at 01/14/16 0927 Last data filed at 01/14/16 0700  Gross per 24 hour  Intake 2613.07 ml  Output   1905 ml  Net 708.07 ml     Physical Exam: General:  Lying in bed. awake HEENT: normal Neck: supple. R CEA bandage with drain in place   Carotids 2+ bilat; no bruits. No thyromegaly or nodule noted Cor: Mechanical heart sounds with LVAD hum present. Lungs: CTAB, normal effort Abdomen: soft, NT, ND, no HSM. No bruits or masses. +BS . Driveline: C/D/I; securement device intact and driveline incorporated Extremities: no cyanosis, clubbing, rash, 1+ lower leg edema Neuro: alert & orientedx3, cranial nerves grossly intact. moves all 4 extremities w/o difficulty. Affect pleasant  Telemetry: Reviewed personally, Afib 120s  Labs: Basic Metabolic Panel:  Recent Labs Lab 01/11/16 1953 01/13/16 0300 01/14/16 0412  NA 142 140 140  K 4.1 4.2 3.6  CL 110 110 107  CO2 23 22 23   GLUCOSE 108* 112* 110*  BUN 20 19 15   CREATININE 1.59* 1.44* 1.35*  CALCIUM 8.9 8.8* 8.6*    Liver Function  Tests:  Recent Labs Lab 01/11/16 1953  AST 30  ALT 20  ALKPHOS 107  BILITOT 0.4  PROT 6.6  ALBUMIN 3.4*   No results for input(s): LIPASE, AMYLASE in the last 168 hours. No results for input(s): AMMONIA in the last 168 hours.  CBC:  Recent Labs Lab 01/11/16 1953 01/12/16 0251 01/13/16 0300 01/14/16 0412  WBC 6.5 5.1 6.0 6.8  NEUTROABS  --  3.5 4.2 4.8  HGB 10.5* 9.1* 9.8* 10.5*  HCT 32.2* 28.8* 31.5* 33.2*  MCV 93.3 93.5 92.6 90.5  PLT 171 136* 161 147*    INR:  Recent Labs Lab 01/11/16 1953 01/12/16 0251 01/12/16 1814 01/13/16 0300 01/14/16 0412  INR 2.00* 1.98* 1.88* 1.91* 1.72*    Other results:    Imaging: No results found.   Medications:     Scheduled Medications: . sodium chloride   Intravenous Once  . amLODipine  10 mg Oral Daily  . atorvastatin  80 mg Oral q1800  . cefUROXime (ZINACEF)  IV  1.5 g Intravenous Q12H  . ferrous gluconate  324 mg Oral BID WC  . levothyroxine  200 mcg Oral QAC breakfast  . metoprolol succinate  50 mg Oral BID  . pantoprazole  40 mg Oral BID  . ranolazine  500 mg Oral BID  . sodium chloride flush  3 mL Intravenous Q12H    Infusions: . sodium chloride 10 mL/hr  at 01/14/16 0600  . amiodarone 30 mg/hr (01/14/16 0700)  . diltiazem (CARDIZEM) infusion 10 mg/hr (01/13/16 1702)  . phenylephrine (NEO-SYNEPHRINE) Adult infusion      PRN Medications: morphine injection, ondansetron, oxyCODONE-acetaminophen, promethazine, sodium chloride flush   Assessment:   1. High-grade R internal carotid stenosis    --s/p R CE 01/13/16 2. 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  --still volume overloaded. Give one more dose IV lasix 20 3. Atrial arrhythmias: Atrial fibrillation, atrial tachycardia. S/p DC-CV 06/04/14 and repeat DC-CV 03/02/15.    --remains in RVR despite amio. 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 Will be hard to rate control atrial tach. Restart metoprolol     --plan TEE/DC-CV prior to  DC 4. CKD, stage III 5. 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 Now off Plavix and warfarin.  6. Lumbar compression fracture: This is major limiting factor for him. Follows with Dr. Vertell Limber. Pending surgery once recovered from carotid surgery   Plan/Discussion:    He has tolerated surgery well. Had some volume overload post-op and improved with IV lasix. Will give one more dose.   HR remains fast despite amio. Do not want to push dose given h/o amio thyroid toxicity. Will restart metoprolol. Plan TEE/DC-CV prior to discharge once he is anticoagulated.   INR currently 1.7. Will restart warfarin today. If INR < 1.8 will start heparin in am (no bolus). Discussed with VVS  VAD parameters ok. Hgb stable.  I reviewed the LVAD parameters from today, and compared the results to the patient's prior recorded data.  No programming changes were made.  The LVAD is functioning within specified parameters.  The patient performs LVAD self-test daily.  LVAD interrogation was negative for any significant power changes, alarms or PI events/speed drops.  LVAD equipment check completed and is in good working order.  Back-up equipment present.   LVAD education done on emergency procedures and precautions and reviewed exit site care.  Length of Stay: 3  Bunnie Rehberg MD  01/14/2016, 9:27 AM  VAD Team --- VAD ISSUES ONLY--- Pager [phone] (7am - 7am)  Advanced Heart Failure Team  Pager [phone] (M-F; 7a - 4p)  Please contact [facility] for night-coverage after hours (4p -7a ) and weekends on [email or web address]

## 2016-01-14 NOTE — Progress Notes (Signed)
[place] for warfarin Indication: VAD  Allergies  Allergen Reactions  . Demerol [Meperidine] Other (See Comments)    Paralysis. Could only move eyes.    Patient Measurements: Height: 6' (182.9 cm) Weight: 195 lb 12.3 oz (88.8 kg) IBW/kg (Calculated) : 77.6   Vital Signs: Temp: 98.7 F (37.1 C) (03/11 0800) Temp Source: Oral (03/11 0800) Pulse Rate: 119 (03/11 0700)  Labs:  Recent Labs  01/11/16 1953 01/12/16 0251 01/12/16 1814 01/13/16 0300 01/14/16 0412  HGB 10.5* 9.1*  --  9.8* 10.5*  HCT 32.2* 28.8*  --  31.5* 33.2*  PLT 171 136*  --  161 147*  APTT 44*  --   --   --   --   LABPROT 22.6* 22.4* 21.5* 21.8* 20.1*  INR 2.00* 1.98* 1.88* 1.91* 1.72*  CREATININE 1.59*  --   --  1.44* 1.35*    Estimated Creatinine Clearance: 51.1 mL/min (by C-G formula based on Cr of 1.35).  Assessment: 75yo male with LVAD presents s/p carotid endarterectomy 3/10. Warfarin is to be restart tonight, INR currently 1.7. Noted plans to start IV heparin in am if INR still <1.8.   No bleeding complications noted overnight.  Goal of Therapy:  INR goal 2-2.5  Monitor platelets by anticoagulation protocol: Yes   Plan:  Hold heparin today Daily INR Restart warfarin with 4mg  tonight  Erin Hearing PharmD., BCPS Clinical Pharmacist Pager [phone] 01/14/2016 10:29 AM

## 2016-01-15 LAB — CBC WITH DIFFERENTIAL/PLATELET
BASOS PCT: 0 %
Basophils Absolute: 0 10*3/uL (ref 0.0–0.1)
EOS ABS: 0.2 10*3/uL (ref 0.0–0.7)
EOS PCT: 2 %
HCT: 31.5 % — ABNORMAL LOW (ref 39.0–52.0)
HEMOGLOBIN: 9.9 g/dL — AB (ref 13.0–17.0)
Lymphocytes Relative: 11 %
Lymphs Abs: 0.7 10*3/uL (ref 0.7–4.0)
MCH: 28.4 pg (ref 26.0–34.0)
MCHC: 31.4 g/dL (ref 30.0–36.0)
MCV: 90.3 fL (ref 78.0–100.0)
MONO ABS: 1 10*3/uL (ref 0.1–1.0)
MONOS PCT: 17 %
Neutro Abs: 4.2 10*3/uL (ref 1.7–7.7)
Neutrophils Relative %: 70 %
Platelets: 129 10*3/uL — ABNORMAL LOW (ref 150–400)
RBC: 3.49 MIL/uL — ABNORMAL LOW (ref 4.22–5.81)
RDW: 17 % — AB (ref 11.5–15.5)
WBC: 6.1 10*3/uL (ref 4.0–10.5)

## 2016-01-15 LAB — TYPE AND SCREEN
ABO/RH(D): O POS
ANTIBODY SCREEN: NEGATIVE
UNIT DIVISION: 0
Unit division: 0

## 2016-01-15 LAB — BASIC METABOLIC PANEL
ANION GAP: 11 (ref 5–15)
BUN: 18 mg/dL (ref 6–20)
CALCIUM: 8.6 mg/dL — AB (ref 8.9–10.3)
CHLORIDE: 103 mmol/L (ref 101–111)
CO2: 24 mmol/L (ref 22–32)
CREATININE: 1.41 mg/dL — AB (ref 0.61–1.24)
GFR calc non Af Amer: 47 mL/min — ABNORMAL LOW (ref >60)
GFR, EST AFRICAN AMERICAN: 54 mL/min — AB (ref >60)
GLUCOSE: 114 mg/dL — AB (ref 65–99)
Potassium: 3.9 mmol/L (ref 3.5–5.1)
Sodium: 138 mmol/L (ref 135–145)

## 2016-01-15 LAB — LACTATE DEHYDROGENASE: LDH: 221 U/L — AB (ref 98–192)

## 2016-01-15 LAB — PROTIME-INR
INR: 1.61 — ABNORMAL HIGH (ref 0.00–1.49)
PROTHROMBIN TIME: 19.2 s — AB (ref 11.6–15.2)

## 2016-01-15 LAB — HEPARIN LEVEL (UNFRACTIONATED): Heparin Unfractionated: 0.1 IU/mL — ABNORMAL LOW (ref 0.30–0.70)

## 2016-01-15 MED ORDER — POTASSIUM CHLORIDE CRYS ER 20 MEQ PO TBCR
20.0000 meq | EXTENDED_RELEASE_TABLET | Freq: Once | ORAL | Status: AC
  Administered 2016-01-15: 20 meq via ORAL
  Filled 2016-01-15: qty 1

## 2016-01-15 MED ORDER — WARFARIN SODIUM 2 MG PO TABS
2.0000 mg | ORAL_TABLET | Freq: Once | ORAL | Status: AC
  Administered 2016-01-15: 2 mg via ORAL
  Filled 2016-01-15: qty 1

## 2016-01-15 MED ORDER — HEPARIN (PORCINE) IN NACL 100-0.45 UNIT/ML-% IJ SOLN
1300.0000 [IU]/h | INTRAMUSCULAR | Status: DC
  Administered 2016-01-15: 1100 [IU]/h via INTRAVENOUS
  Administered 2016-01-16: 1300 [IU]/h via INTRAVENOUS
  Filled 2016-01-15 (×2): qty 250

## 2016-01-15 MED ORDER — FUROSEMIDE 10 MG/ML IJ SOLN: 20.0000 mg | Freq: Once | INTRAMUSCULAR | Status: DC

## 2016-01-15 NOTE — Progress Notes (Signed)
[name] for heparin Indication: VAD  Allergies  Allergen Reactions  . Demerol [Meperidine] Other (See Comments)    Paralysis. Could only move eyes.    Patient Measurements: Height: 6' (182.9 cm) Weight: 195 lb 12.3 oz (88.8 kg) IBW/kg (Calculated) : 77.6 Heparin dosing weight: 88.8  Vital Signs: Temp: 98.2 F (36.8 C) (03/12 1736) Temp Source: Oral (03/12 1736) Pulse Rate: 115 (03/12 1700)  Labs:  Recent Labs  01/13/16 0300 01/14/16 0412 01/15/16 0359 01/15/16 1700  HGB 9.8* 10.5* 9.9*  --   HCT 31.5* 33.2* 31.5*  --   PLT 161 147* 129*  --   LABPROT 21.8* 20.1* 19.2*  --   INR 1.91* 1.72* 1.61*  --   HEPARINUNFRC  --   --   --  0.10*  CREATININE 1.44* 1.35* 1.41*  --     Estimated Creatinine Clearance: 48.9 mL/min (by C-G formula based on Cr of 1.41).  Assessment: 75yo male with LVAD presents s/p carotid endarterectomy 3/10. Pharmacy consulted to dose warfarin with heparin bridge.  HL this evening is undetectable. RN states that pt loss IV access and heparin may have infiltrated. Though she believes the labs were drawn before this happened. Despite this, will go ahead and increase rate.   Goal of Therapy:  Heparin level 0.3-0.5 INR goal 2-2.5  Monitor platelets by anticoagulation protocol: Yes   Plan:  -Increase heparin 1300 units/hr -Check 8 hour level  -Daily HL, CBC   Harvel Quale  01/15/2016 6:59 PM

## 2016-01-15 NOTE — Progress Notes (Signed)
   Daily Progress Note  Assessment/Planning: POD #2 s/p R CEA   D/C JP as minimal output  Cardiac mgmt per Heart Failure team  Follow up with Dr. Kellie Simmering in 2 weeks in the office  Subjective  - 2 Days Post-Op  No events overnight, no complaints  Objective Filed Vitals:   01/15/16 0200 01/15/16 0300 01/15/16 0400 01/15/16 0500  Pulse: 119 119 114   Temp:    99 F (37.2 C)  TempSrc:    Oral  Resp: 11 12 13    Height:      Weight:      SpO2:   94%     Intake/Output Summary (Last 24 hours) at 01/15/16 0746 Last data filed at 01/15/16 0610  Gross per 24 hour  Intake 1010.7 ml  Output   1310 ml  Net -299.3 ml   NECK  No hematoma, JP in place: serosanguinous, 35 cc NEURO   Intact sym motor, tongue midline  Laboratory CBC    Component Value Date/Time   WBC 6.1 01/15/2016 0359   WBC 5.8 06/30/2015 1115   HGB 9.9* 01/15/2016 0359   HCT 31.5* 01/15/2016 0359   HCT 32.0* 06/30/2015 1115   PLT 129* 01/15/2016 0359   PLT 226 06/30/2015 1115    BMET    Component Value Date/Time   NA 138 01/15/2016 0359   NA 139 08/16/2014 1420   K 3.9 01/15/2016 0359   CL 103 01/15/2016 0359   CO2 24 01/15/2016 0359   GLUCOSE 114* 01/15/2016 0359   GLUCOSE 94 08/16/2014 1420   BUN 18 01/15/2016 0359   BUN 22 08/16/2014 1420   CREATININE 1.41* 01/15/2016 0359   CALCIUM 8.6* 01/15/2016 0359   GFRNONAA 47* 01/15/2016 0359   GFRAA 54* 01/15/2016 0359   Lab Results  Component Value Date   INR 1.61* 01/15/2016   INR 1.72* 01/14/2016   INR 1.91* 01/13/2016    Adele Barthel, MD [facility] [place]: [phone] Pager: [phone]  01/15/2016, 7:46 AM

## 2016-01-15 NOTE — Progress Notes (Signed)
[place] for warfarin and heparin Indication: VAD  Allergies  Allergen Reactions  . Demerol [Meperidine] Other (See Comments)    Paralysis. Could only move eyes.    Patient Measurements: Height: 6' (182.9 cm) Weight: 195 lb 12.3 oz (88.8 kg) IBW/kg (Calculated) : 77.6 Heparin dosing weight: 88.8  Vital Signs: Temp: 99 F (37.2 C) (03/12 0500) Temp Source: Oral (03/12 0500) Pulse Rate: 114 (03/12 0400)  Labs:  Recent Labs  01/13/16 0300 01/14/16 0412 01/15/16 0359  HGB 9.8* 10.5* 9.9*  HCT 31.5* 33.2* 31.5*  PLT 161 147* 129*  LABPROT 21.8* 20.1* 19.2*  INR 1.91* 1.72* 1.61*  CREATININE 1.44* 1.35* 1.41*    Estimated Creatinine Clearance: 48.9 mL/min (by C-G formula based on Cr of 1.41).  Assessment: 75yo male with LVAD presents s/p carotid endarterectomy 3/10. Pharmacy consulted to dose warfarin with heparin bridge.  PMH carotid stenosis, HFrEF (EF 20-25%) s/p HMII (10/2013), atrial arrhythmias, CKD III, hx GIB, lumbar compression fx  INR 1.61 today after initiating 4 mg dose yesterday. Warfarin restarted on 3/11 without heparin however, heparin started this AM d/t INR < 1.8. hgb 9.9, plt down to 129. No noted bleeding. Given hx of GIB and recent endarterectomy, will reinitiate home dose without additional boosted doses of warfarin today.  PTA warfarin dose 4 mg qMon, 2 mg all other days  Goal of Therapy:  Heparin level 0.3-0.5 INR goal 2-2.5  Monitor platelets by anticoagulation protocol: Yes   Plan:  No bolus Heparin 1100 units/h Warfarin 2 mg x 1 tonight 1700 HL Daily INR, HL, CBC Monitor s/sx bleeding    Heloise Ochoa, Pharm.D., BCPS PGY2 Cardiology Pharmacy Resident Pager: [phone] 01/15/2016 8:13 AM

## 2016-01-15 NOTE — Progress Notes (Signed)
Patient ID: [name], male   DOB: [birth year], 75 y.o.   MRN: [id number]  VAD TEAM ROUNDING NOTE  HeartMate 2 Rounding Note  Subjective:    Underwent R CEA 99991111 without complication.   Got 1u RBCs in PACU. Post-op developed respiratory distress and was given IV lasix. Out > 1L.   Started back on amio (despite h/o amio thyroid toxicity) on 3/10 due to atrial tach with persistent RVR despite diltiazem at 10.   This morning, he is on IV amiodarone gtt with HR in 110s.  MAP about 70.  He says he feels fine.  Still on oxygen but lying flat.    LVAD INTERROGATION:  HeartMate II LVAD:  Flow 4.0 liters/min, speed 9000, power 4.7, PI 3.6. No PI events  Objective:    Vital Signs:   Temp:  [97.4 F (36.3 C)-99 F (37.2 C)] 99 F (37.2 C) (03/12 0500) Pulse Rate:  [114-124] 114 (03/12 0400) Resp:  [11-24] 13 (03/12 0400) SpO2:  [91 %-97 %] 94 % (03/12 0400) Arterial Line BP: (70-94)/(66-83) 72/68 mmHg (03/12 0400) Last BM Date: 01/12/16 Mean arterial Pressure 70  Intake/Output:   Intake/Output Summary (Last 24 hours) at 01/15/16 0810 Last data filed at 01/15/16 0610  Gross per 24 hour  Intake    984 ml  Output   1310 ml  Net   -326 ml     Physical Exam: General:  Lying in bed. awake HEENT: normal Neck: supple. R CEA bandage with drain in place   Carotids 2+ bilat; no bruits. No thyromegaly or nodule noted Cor: Mechanical heart sounds with LVAD hum present. Lungs: CTAB, normal effort Abdomen: soft, NT, ND, no HSM. No bruits or masses. +BS . Driveline: C/D/I; securement device intact and driveline incorporated Extremities: no cyanosis, clubbing, rash.  No edema. Neuro: alert & orientedx3, cranial nerves grossly intact. moves all 4 extremities w/o difficulty. Affect pleasant  Telemetry: Reviewed personally, atrial tachycardia 110s  Labs: Basic Metabolic Panel:  Recent Labs Lab 01/11/16 1953 01/13/16 0300 01/14/16 0412 01/15/16 0359  NA 142 140 140 138  K 4.1  4.2 3.6 3.9  CL 110 110 107 103  CO2 23 22 23 24   GLUCOSE 108* 112* 110* 114*  BUN 20 19 15 18   CREATININE 1.59* 1.44* 1.35* 1.41*  CALCIUM 8.9 8.8* 8.6* 8.6*    Liver Function Tests:  Recent Labs Lab 01/11/16 1953  AST 30  ALT 20  ALKPHOS 107  BILITOT 0.4  PROT 6.6  ALBUMIN 3.4*   No results for input(s): LIPASE, AMYLASE in the last 168 hours. No results for input(s): AMMONIA in the last 168 hours.  CBC:  Recent Labs Lab 01/11/16 1953 01/12/16 0251 01/13/16 0300 01/14/16 0412 01/15/16 0359  WBC 6.5 5.1 6.0 6.8 6.1  NEUTROABS  --  3.5 4.2 4.8 4.2  HGB 10.5* 9.1* 9.8* 10.5* 9.9*  HCT 32.2* 28.8* 31.5* 33.2* 31.5*  MCV 93.3 93.5 92.6 90.5 90.3  PLT 171 136* 161 147* 129*    INR:  Recent Labs Lab 01/12/16 0251 01/12/16 1814 01/13/16 0300 01/14/16 0412 01/15/16 0359  INR 1.98* 1.88* 1.91* 1.72* 1.61*    Other results:    Imaging: No results found.   Medications:     Scheduled Medications: . sodium chloride   Intravenous Once  . atorvastatin  80 mg Oral q1800  . ferrous gluconate  324 mg Oral BID WC  . furosemide  20 mg Intravenous Once  . levothyroxine  200 mcg Oral  QAC breakfast  . metoprolol succinate  50 mg Oral BID  . pantoprazole  40 mg Oral BID  . potassium chloride  20 mEq Oral Once  . ranolazine  500 mg Oral BID  . sodium chloride flush  3 mL Intravenous Q12H  . Warfarin - Pharmacist Dosing Inpatient   Does not apply q1800    Infusions: . sodium chloride Stopped (01/14/16 0800)  . amiodarone 30 mg/hr (01/15/16 0610)  . phenylephrine (NEO-SYNEPHRINE) Adult infusion      PRN Medications: morphine injection, ondansetron, oxyCODONE-acetaminophen, promethazine, sodium chloride flush   Assessment:   1. High-grade R internal carotid stenosis    --s/p R CE 01/13/16 2. 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  -- Volume better after Lasix yesterday, no edema.  3. Atrial arrhythmias: Atrial fibrillation, atrial  tachycardia. S/p DC-CV 06/04/14 and repeat DC-CV 03/02/15.    --Remains in RVR despite amio. 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 Will be hard to rate control atrial tachycardia. Restarted metoprolol XL.      --plan TEE/DC-CV prior to DC 4. CKD, stage III 5. 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 Now on Plavix and warfarin at home.  6. Lumbar compression fracture: This is major limiting factor for him. Follows with Dr. Vertell Limber. Pending surgery once recovered from carotid surgery   Plan/Discussion:    Volume looks ok today.  With PI and MAP lower, will not give Lasix again today.    HR remains fast in atrial tachycardia despite amiodarone and metoprolol. Do not want to push dose given h/o amio thyroid toxicity. Plan TEE/DC-CV, possibly tomorrow.  Will keep NPO at midnight.    INR currently 1.6. Warfarin restarted. Today will start heparin w/o bolus (ok'd with VVS).   Hgb stable.  I reviewed the LVAD parameters from today, and compared the results to the patient's prior recorded data.  No programming changes were made.  The LVAD is functioning within specified parameters.  The patient performs LVAD self-test daily.  LVAD interrogation was negative for any significant power changes, alarms or PI events/speed drops.  LVAD equipment check completed and is in good working order.  Back-up equipment present.   LVAD education done on emergency procedures and precautions and reviewed exit site care.  Length of Stay: 4  Loralie Champagne MD  01/15/2016, 8:10 AM  VAD Team --- VAD ISSUES ONLY--- Pager [phone] (7am - 7am)  Advanced Heart Failure Team  Pager [phone] (M-F; 7a - 4p)  Please contact [facility] for night-coverage after hours (4p -7a ) and weekends on [email or web address]

## 2016-01-16 ENCOUNTER — Encounter (HOSPITAL_COMMUNITY): Payer: Self-pay | Admitting: Vascular Surgery

## 2016-01-16 LAB — PROTIME-INR
INR: 1.86 — AB (ref 0.00–1.49)
PROTHROMBIN TIME: 21.4 s — AB (ref 11.6–15.2)

## 2016-01-16 LAB — LACTATE DEHYDROGENASE: LDH: 213 U/L — ABNORMAL HIGH (ref 98–192)

## 2016-01-16 LAB — BASIC METABOLIC PANEL
ANION GAP: 7 (ref 5–15)
BUN: 18 mg/dL (ref 6–20)
CALCIUM: 8.4 mg/dL — AB (ref 8.9–10.3)
CO2: 25 mmol/L (ref 22–32)
Chloride: 104 mmol/L (ref 101–111)
Creatinine, Ser: 1.26 mg/dL — ABNORMAL HIGH (ref 0.61–1.24)
GFR calc non Af Amer: 54 mL/min — ABNORMAL LOW (ref >60)
Glucose, Bld: 112 mg/dL — ABNORMAL HIGH (ref 65–99)
POTASSIUM: 4 mmol/L (ref 3.5–5.1)
Sodium: 136 mmol/L (ref 135–145)

## 2016-01-16 LAB — CBC WITH DIFFERENTIAL/PLATELET
Basophils Absolute: 0 10*3/uL (ref 0.0–0.1)
Basophils Relative: 0 %
EOS ABS: 0.2 10*3/uL (ref 0.0–0.7)
EOS PCT: 3 %
HCT: 30.3 % — ABNORMAL LOW (ref 39.0–52.0)
HEMOGLOBIN: 9.4 g/dL — AB (ref 13.0–17.0)
LYMPHS ABS: 0.8 10*3/uL (ref 0.7–4.0)
Lymphocytes Relative: 14 %
MCH: 28.3 pg (ref 26.0–34.0)
MCHC: 31 g/dL (ref 30.0–36.0)
MCV: 91.3 fL (ref 78.0–100.0)
MONO ABS: 0.9 10*3/uL (ref 0.1–1.0)
MONOS PCT: 15 %
NEUTROS PCT: 68 %
Neutro Abs: 4 10*3/uL (ref 1.7–7.7)
PLATELETS: 132 10*3/uL — AB (ref 150–400)
RBC: 3.32 MIL/uL — ABNORMAL LOW (ref 4.22–5.81)
RDW: 16.8 % — AB (ref 11.5–15.5)
WBC: 5.9 10*3/uL (ref 4.0–10.5)

## 2016-01-16 LAB — HEPARIN LEVEL (UNFRACTIONATED): Heparin Unfractionated: 0.36 IU/mL (ref 0.30–0.70)

## 2016-01-16 MED ORDER — WARFARIN SODIUM 3 MG PO TABS
4.0000 mg | ORAL_TABLET | Freq: Once | ORAL | Status: DC
  Filled 2016-01-16: qty 0.5

## 2016-01-16 MED ORDER — BENZONATATE 100 MG PO CAPS
200.0000 mg | ORAL_CAPSULE | Freq: Three times a day (TID) | ORAL | Status: DC | PRN
  Administered 2016-01-16: 200 mg via ORAL
  Filled 2016-01-16: qty 2

## 2016-01-16 MED ORDER — GUAIFENESIN ER 600 MG PO TB12
600.0000 mg | ORAL_TABLET | Freq: Two times a day (BID) | ORAL | Status: DC
  Administered 2016-01-16 – 2016-01-20 (×7): 600 mg via ORAL
  Filled 2016-01-16 (×10): qty 1

## 2016-01-16 MED ORDER — WARFARIN SODIUM 3 MG PO TABS
4.0000 mg | ORAL_TABLET | ORAL | Status: DC
  Filled 2016-01-16: qty 0.5

## 2016-01-16 MED ORDER — WARFARIN SODIUM 4 MG PO TABS
4.0000 mg | ORAL_TABLET | ORAL | Status: AC
Start: 2016-01-16 — End: 2016-01-16
  Administered 2016-01-16: 4 mg via ORAL
  Filled 2016-01-16: qty 1

## 2016-01-16 NOTE — Progress Notes (Signed)
   01/16/16 1200  Clinical Encounter Type  Visited With Patient  Visit Type Initial  Referral From Nurse  Stress Factors  Patient Stress Factors Health changes  Patient trying to sleep, told him I would come back later.

## 2016-01-16 NOTE — Progress Notes (Signed)
Patient ID: [name], male   DOB: [birth year], 75 y.o.   MRN: [id number]  VAD TEAM ROUNDING NOTE  HeartMate 2 Rounding Note  Subjective:    Underwent R CEA 99991111 without complication.   Got 1u RBCs in PACU. Post-op developed respiratory distress and was given IV lasix. Out > 1L.   Started back on amio (despite h/o amio thyroid toxicity) on 3/10 due to atrial tach with persistent RVR despite diltiazem at 10.   Remains in A tach despite amio drip and metoprolol XL. Denies SOB. Ambulated 400 feet.   LVAD INTERROGATION:  HeartMate II LVAD:  Flow 3.7 liters/min, speed 9000, power 4.5, PI 3.9. No PI events  Objective:    Vital Signs:   Temp:  [97.4 F (36.3 C)-99 F (37.2 C)] 97.9 F (36.6 C) (03/13 0432) Pulse Rate:  [31-119] 115 (03/13 0700) Resp:  [11-26] 12 (03/13 0700) SpO2:  [84 %-97 %] 96 % (03/13 0700) Arterial Line BP: (67-95)/(63-89) 77/72 mmHg (03/13 0700) Weight:  [196 lb 3.4 oz (89 kg)] 196 lb 3.4 oz (89 kg) (03/13 0600) Last BM Date: 01/15/16 Mean arterial Pressure 70s  Intake/Output:   Intake/Output Summary (Last 24 hours) at 01/16/16 0729 Last data filed at 01/16/16 0700  Gross per 24 hour  Intake 1343.63 ml  Output    526 ml  Net 817.63 ml     Physical Exam: General:  Lying in bed. awake HEENT: normal Neck: supple. R CEA incision approximated. Mild swelling. No exudate.  Carotids 2+ bilat; no bruits. No thyromegaly or nodule noted Cor: Mechanical heart sounds with LVAD hum present. Lungs: CTAB, normal effort Abdomen: soft, NT, ND, no HSM. No bruits or masses. +BS . Driveline: C/D/I; securement device intact and driveline incorporated Extremities: no cyanosis, clubbing, rash.  No edema. Neuro: alert & orientedx3, cranial nerves grossly intact. moves all 4 extremities w/o difficulty. Affect pleasant  Telemetry: Atrial tachycardia 110s  Labs: Basic Metabolic Panel:  Recent Labs Lab 01/11/16 1953 01/13/16 0300 01/14/16 0412 01/15/16 0359  01/16/16 0307  NA 142 140 140 138 136  K 4.1 4.2 3.6 3.9 4.0  CL 110 110 107 103 104  CO2 23 22 23 24 25   GLUCOSE 108* 112* 110* 114* 112*  BUN 20 19 15 18 18   CREATININE 1.59* 1.44* 1.35* 1.41* 1.26*  CALCIUM 8.9 8.8* 8.6* 8.6* 8.4*    Liver Function Tests:  Recent Labs Lab 01/11/16 1953  AST 30  ALT 20  ALKPHOS 107  BILITOT 0.4  PROT 6.6  ALBUMIN 3.4*   No results for input(s): LIPASE, AMYLASE in the last 168 hours. No results for input(s): AMMONIA in the last 168 hours.  CBC:  Recent Labs Lab 01/12/16 0251 01/13/16 0300 01/14/16 0412 01/15/16 0359 01/16/16 0307  WBC 5.1 6.0 6.8 6.1 5.9  NEUTROABS 3.5 4.2 4.8 4.2 4.0  HGB 9.1* 9.8* 10.5* 9.9* 9.4*  HCT 28.8* 31.5* 33.2* 31.5* 30.3*  MCV 93.5 92.6 90.5 90.3 91.3  PLT 136* 161 147* 129* 132*    INR:  Recent Labs Lab 01/12/16 1814 01/13/16 0300 01/14/16 0412 01/15/16 0359 01/16/16 0307  INR 1.88* 1.91* 1.72* 1.61* 1.86*    Other results:    Imaging: No results found.   Medications:     Scheduled Medications: . sodium chloride   Intravenous Once  . atorvastatin  80 mg Oral q1800  . ferrous gluconate  324 mg Oral BID WC  . levothyroxine  200 mcg Oral QAC breakfast  . metoprolol  succinate  50 mg Oral BID  . pantoprazole  40 mg Oral BID  . ranolazine  500 mg Oral BID  . sodium chloride flush  3 mL Intravenous Q12H  . Warfarin - Pharmacist Dosing Inpatient   Does not apply q1800    Infusions: . sodium chloride Stopped (01/14/16 0800)  . amiodarone 30 mg/hr (01/16/16 0700)  . heparin 1,300 Units/hr (01/16/16 0700)  . phenylephrine (NEO-SYNEPHRINE) Adult infusion      PRN Medications: morphine injection, ondansetron, oxyCODONE-acetaminophen, promethazine, sodium chloride flush   Assessment:   1. High-grade R internal carotid stenosis    --s/p R CEA 01/13/16 2. 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 3. Atrial arrhythmias: Atrial fibrillation, atrial tachycardia. S/p  DC-CV 06/04/14 and repeat DC-CV 03/02/15.    --Remains in RVR despite amio. 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 Will be hard to rate control atrial tachycardia.  On metoprolol XL.      --plan TEE/DC-CV today  4. CKD, stage III 5. 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 Now on Plavix and warfarin at home.  6. Lumbar compression fracture: This is major limiting factor for him. Follows with Dr. Vertell Limber. Pending surgery once recovered from carotid surgery   Plan/Discussion:    Volume looks ok today.  With PI and MAP lower, will not give Lasix again today.    Remains in  A tach despite amiodarone and metoprolol. Do not want to push dose given h/o amio thyroid toxicity. Plan TEE/DC-CV today.  Will keep NPO.     INR currently 1.86 on warfarin. On Heparin.  INR goal 2-2.5   Hgb trending down over the last 3 days. 10.5>9.9>9.4   I reviewed the LVAD parameters from today, and compared the results to the patient's prior recorded data.  No programming changes were made.  The LVAD is functioning within specified parameters.  The patient performs LVAD self-test daily.  LVAD interrogation was negative for any significant power changes, alarms or PI events/speed drops.  LVAD equipment check completed and is in good working order.  Back-up equipment present.   LVAD education done on emergency procedures and precautions and reviewed exit site care.  Length of Stay: 5  Amy Clegg NP-C  01/16/2016, 7:29 AM  VAD Team --- VAD ISSUES ONLY--- Pager [phone] (7am - 7am)  Advanced Heart Failure Team  Pager [phone] (M-F; 7a - 4p)  Please contact [facility] for night-coverage after hours (4p -7a ) and weekends on [email or web address]   Patient seen and examined with Darrick Grinder, NP. We discussed all aspects of the encounter. I agree with the assessment and plan as stated above.   He is feeling better. Denies SOB or palpitations. Hgb drifting down but no significant operative site bleeding. Drain has  been removed. With INR 1.86 will stop heparin. Continue coumadin. Plan TEE and DC-CV tomorrow if INR>=2.0. Continue low dose amio. Volume status stable. VAD parameters and MAPs stable. Continue to ambulate.   Bensimhon, Daniel,MD 9:29 PM

## 2016-01-16 NOTE — Progress Notes (Signed)
UR Completed. Joelynn Dust, RN, BSN.  [phone] 

## 2016-01-16 NOTE — Progress Notes (Signed)
[place] for warfarin Indication: VAD  Allergies  Allergen Reactions  . Demerol [Meperidine] Other (See Comments)    Paralysis. Could only move eyes.    Patient Measurements: Height: 6' (182.9 cm) Weight: 196 lb 3.4 oz (89 kg) IBW/kg (Calculated) : 77.6 Heparin dosing weight: 88.8  Vital Signs: Temp: 98.2 F (36.8 C) (03/13 0811) Temp Source: Oral (03/13 0811) Pulse Rate: 114 (03/13 0900)  Labs:  Recent Labs  01/14/16 0412 01/15/16 0359 01/15/16 1700 01/16/16 0307  HGB 10.5* 9.9*  --  9.4*  HCT 33.2* 31.5*  --  30.3*  PLT 147* 129*  --  132*  LABPROT 20.1* 19.2*  --  21.4*  INR 1.72* 1.61*  --  1.86*  HEPARINUNFRC  --   --  0.10* 0.36  CREATININE 1.35* 1.41*  --  1.26*    Estimated Creatinine Clearance: 54.7 mL/min (by C-G formula based on Cr of 1.26).  Assessment: 75yo male with LVAD presents s/p carotid endarterectomy 3/10. Pharmacy consulted to dose warfarin.  PMH carotid stenosis, HFrEF (EF 20-25%) s/p HMII (10/2013), atrial arrhythmias, CKD III, hx GIB, lumbar compression fx  INR 1.86 today. Warfarin restarted on 3/11 with heparin bridge until INR > 1.8. hgb 9.4, plt 132. No noted bleeding. Given hx of GIB, recent endarterectomy and INR > 1.8, will discontinue heparin.  PTA warfarin dose 4 mg qMon, 2 mg all other days  Goal of Therapy:  INR goal 2-2.5  Monitor platelets by anticoagulation protocol: Yes   Plan:  Discontinue Heparin 1100 units/h Warfarin 4 mg x 1 tonight Daily INR, CBC Monitor s/sx bleeding  Melburn Popper, PharmD Clinical Pharmacy Resident Pager: [phone] 01/16/2016 9:58 AM

## 2016-01-16 NOTE — Progress Notes (Signed)
ANTICOAGULATION CONSULT NOTE - Follow Up Consult  Pharmacy Consult for heparin Indication: LVAd  Labs:  Recent Labs  01/14/16 0412 01/15/16 0359 01/15/16 1700 01/16/16 0307  HGB 10.5* 9.9*  --  9.4*  HCT 33.2* 31.5*  --  30.3*  PLT 147* 129*  --  132*  LABPROT 20.1* 19.2*  --  21.4*  INR 1.72* 1.61*  --  1.86*  HEPARINUNFRC  --   --  0.10* 0.36  CREATININE 1.35* 1.41*  --   --       Assessment/Plan:  75yo male therapeutic on heparin after rate increase. Will continue gtt at current rate and confirm stable with additional level.   Wynona Neat, PharmD, BCPS  01/16/2016,3:35 AM

## 2016-01-16 NOTE — Progress Notes (Signed)
Patient ID: [name], male   DOB: 09/24/1941, 75 y.o.   MRN: [id number] Patient remains alert and oriented with normal neurologic examination Right neck incision is healed nicely Drain has been removed  4 TEE today Patient remains in rapid A. fib on Cardizem drip Coumadin has been resumed  DC plans per LVAD service  I will see patient in office in follow-up in 2 weeks doing well from vascular standpoint

## 2016-01-16 NOTE — Care Management Important Message (Signed)
Important Message  Patient Details  Name: [name] MRN: [id number] Date of Birth: 06-Jun-1941   Medicare Important Message Given:  Yes    Nathen May 01/16/2016, 9:48 AM

## 2016-01-17 ENCOUNTER — Other Ambulatory Visit (HOSPITAL_COMMUNITY): Payer: Medicare Other

## 2016-01-17 ENCOUNTER — Encounter (HOSPITAL_COMMUNITY)
Admission: RE | Disposition: A | Discharge status: Home / Self Care | Payer: Self-pay | Source: Ambulatory Visit | Attending: Internal Medicine

## 2016-01-17 ENCOUNTER — Inpatient Hospital Stay (HOSPITAL_COMMUNITY): Payer: Medicare Other

## 2016-01-17 ENCOUNTER — Inpatient Hospital Stay (HOSPITAL_COMMUNITY): Payer: Medicare Other | Admitting: Anesthesiology

## 2016-01-17 ENCOUNTER — Encounter (HOSPITAL_COMMUNITY): Payer: Self-pay | Admitting: *Deleted

## 2016-01-17 DIAGNOSIS — I483 Typical atrial flutter: Secondary | ICD-10-CM

## 2016-01-17 LAB — PROTIME-INR
INR: 1.92 — ABNORMAL HIGH (ref 0.00–1.49)
Prothrombin Time: 21.9 seconds — ABNORMAL HIGH (ref 11.6–15.2)

## 2016-01-17 LAB — CBC
HEMATOCRIT: 30.1 % — AB (ref 39.0–52.0)
HEMOGLOBIN: 10 g/dL — AB (ref 13.0–17.0)
MCH: 30 pg (ref 26.0–34.0)
MCHC: 33.2 g/dL (ref 30.0–36.0)
MCV: 90.4 fL (ref 78.0–100.0)
Platelets: 134 10*3/uL — ABNORMAL LOW (ref 150–400)
RBC: 3.33 MIL/uL — ABNORMAL LOW (ref 4.22–5.81)
RDW: 16.6 % — AB (ref 11.5–15.5)
WBC: 5.8 10*3/uL (ref 4.0–10.5)

## 2016-01-17 LAB — BASIC METABOLIC PANEL
Anion gap: 9 (ref 5–15)
BUN: 17 mg/dL (ref 6–20)
CALCIUM: 8.6 mg/dL — AB (ref 8.9–10.3)
CHLORIDE: 105 mmol/L (ref 101–111)
CO2: 26 mmol/L (ref 22–32)
CREATININE: 1.06 mg/dL (ref 0.61–1.24)
GFR calc Af Amer: >60 mL/min (ref >60)
GFR calc non Af Amer: >60 mL/min (ref >60)
GLUCOSE: 103 mg/dL — AB (ref 65–99)
Potassium: 4.3 mmol/L (ref 3.5–5.1)
Sodium: 140 mmol/L (ref 135–145)

## 2016-01-17 LAB — LACTATE DEHYDROGENASE: LDH: 212 U/L — ABNORMAL HIGH (ref 98–192)

## 2016-01-17 SURGERY — CANCELLED PROCEDURE
Anesthesia: Monitor Anesthesia Care

## 2016-01-17 MED ORDER — SODIUM CHLORIDE 0.9 % IV SOLN: INTRAVENOUS | Status: DC

## 2016-01-17 MED ORDER — POTASSIUM CHLORIDE CRYS ER 20 MEQ PO TBCR
20.0000 meq | EXTENDED_RELEASE_TABLET | Freq: Once | ORAL | Status: AC
  Administered 2016-01-17: 20 meq via ORAL
  Filled 2016-01-17: qty 1

## 2016-01-17 MED ORDER — WARFARIN SODIUM 2 MG PO TABS
2.0000 mg | ORAL_TABLET | Freq: Once | ORAL | Status: AC
Start: 2016-01-17 — End: 2016-01-17
  Administered 2016-01-17: 2 mg via ORAL
  Filled 2016-01-17: qty 1

## 2016-01-17 MED ORDER — SODIUM CHLORIDE 0.9% FLUSH
3.0000 mL | Freq: Two times a day (BID) | INTRAVENOUS | Status: DC
  Administered 2016-01-17 – 2016-01-18 (×2): 3 mL via INTRAVENOUS

## 2016-01-17 MED ORDER — SODIUM CHLORIDE 0.9% FLUSH: 3.0000 mL | INTRAVENOUS | Status: DC | PRN

## 2016-01-17 MED ORDER — WARFARIN SODIUM 2 MG PO TABS
2.0000 mg | ORAL_TABLET | ORAL | Status: AC
  Administered 2016-01-17: 2 mg via ORAL
  Filled 2016-01-17: qty 1

## 2016-01-17 MED ORDER — WARFARIN SODIUM 2 MG PO TABS
2.0000 mg | ORAL_TABLET | ORAL | Status: DC
  Filled 2016-01-17: qty 1

## 2016-01-17 MED ORDER — FUROSEMIDE 10 MG/ML IJ SOLN
40.0000 mg | Freq: Two times a day (BID) | INTRAMUSCULAR | Status: DC
  Administered 2016-01-17 – 2016-01-18 (×3): 40 mg via INTRAVENOUS
  Filled 2016-01-17 (×3): qty 4

## 2016-01-17 MED ORDER — SODIUM CHLORIDE 0.9 % IV SOLN: 250.0000 mL | INTRAVENOUS | Status: DC

## 2016-01-17 NOTE — Interval H&P Note (Signed)
History and Physical Interval Note:  01/17/2016 11:14 AM  [name]  has presented today for surgery, with the diagnosis of AFIB  The various methods of treatment have been discussed with the patient and family. After consideration of risks, benefits and other options for treatment, the patient has consented to  Procedure(s): TRANSESOPHAGEAL ECHOCARDIOGRAM (TEE) (N/A) CARDIOVERSION (N/A) as a surgical intervention .  The patient's history has been reviewed, patient examined, no change in status, stable for surgery.  I have reviewed the patient's chart and labs.  Questions were answered to the patient's satisfaction.     Gad Aymond [employer]

## 2016-01-17 NOTE — Anesthesia Preprocedure Evaluation (Signed)
Anesthesia Evaluation  Patient identified by MRN, date of birth, ID band Patient awake    Reviewed: Allergy & Precautions, NPO status , Patient's Chart, lab work & pertinent test results  Airway Mallampati: II  TM Distance: >3 FB Neck ROM: Full    Dental no notable dental hx.    Pulmonary sleep apnea , former smoker,    Pulmonary exam normal breath sounds clear to auscultation       Cardiovascular hypertension, + CAD, + Past MI and +CHF  + dysrhythmias + Cardiac Defibrillator  Rhythm:Regular Rate:Normal + Systolic murmurs LVAD, EF 10-15%   Neuro/Psych CVA negative psych ROS   GI/Hepatic negative GI ROS, Neg liver ROS,   Endo/Other  Hypothyroidism   Renal/GU negative Renal ROS  negative genitourinary   Musculoskeletal negative musculoskeletal ROS (+)   Abdominal   Peds negative pediatric ROS (+)  Hematology  (+) anemia ,   Anesthesia Other Findings   Reproductive/Obstetrics negative OB ROS                             Anesthesia Physical Anesthesia Plan  ASA: IV  Anesthesia Plan: MAC   Post-op Pain Management:    Induction: Intravenous  Airway Management Planned: Nasal Cannula  Additional Equipment:   Intra-op Plan:   Post-operative Plan:   Informed Consent: I have reviewed the patients History and Physical, chart, labs and discussed the procedure including the risks, benefits and alternatives for the proposed anesthesia with the patient or authorized representative who has indicated his/her understanding and acceptance.   Dental advisory given  Plan Discussed with: CRNA and Surgeon  Anesthesia Plan Comments:         Anesthesia Quick Evaluation

## 2016-01-17 NOTE — H&P (View-Only) (Signed)
Patient ID: [name], male   DOB: 06/23/1941, 75 y.o.   MRN: [id number]  VAD TEAM ROUNDING NOTE  HeartMate 2 Rounding Note  Subjective:    Underwent R CEA 99991111 without complication.   Got 1u RBCs in PACU. Post-op developed respiratory distress and was given IV lasix. Out > 1L.   Started back on amio (despite h/o amio thyroid toxicity) on 3/10 due to atrial tach with persistent RVR despite diltiazem at 10.   Remains in A tach despite amio drip and metoprolol XL. Denies SOB. Ambulated 400 feet.   LVAD INTERROGATION:  HeartMate II LVAD:  Flow 3.7 liters/min, speed 9000, power 4.5, PI 3.9. No PI events  Objective:    Vital Signs:   Temp:  [97.4 F (36.3 C)-99 F (37.2 C)] 97.9 F (36.6 C) (03/13 0432) Pulse Rate:  [31-119] 115 (03/13 0700) Resp:  [11-26] 12 (03/13 0700) SpO2:  [84 %-97 %] 96 % (03/13 0700) Arterial Line BP: (67-95)/(63-89) 77/72 mmHg (03/13 0700) Weight:  [196 lb 3.4 oz (89 kg)] 196 lb 3.4 oz (89 kg) (03/13 0600) Last BM Date: 01/15/16 Mean arterial Pressure 70s  Intake/Output:   Intake/Output Summary (Last 24 hours) at 01/16/16 0729 Last data filed at 01/16/16 0700  Gross per 24 hour  Intake 1343.63 ml  Output    526 ml  Net 817.63 ml     Physical Exam: General:  Lying in bed. awake HEENT: normal Neck: supple. R CEA incision approximated. Mild swelling. No exudate.  Carotids 2+ bilat; no bruits. No thyromegaly or nodule noted Cor: Mechanical heart sounds with LVAD hum present. Lungs: CTAB, normal effort Abdomen: soft, NT, ND, no HSM. No bruits or masses. +BS . Driveline: C/D/I; securement device intact and driveline incorporated Extremities: no cyanosis, clubbing, rash.  No edema. Neuro: alert & orientedx3, cranial nerves grossly intact. moves all 4 extremities w/o difficulty. Affect pleasant  Telemetry: Atrial tachycardia 110s  Labs: Basic Metabolic Panel:  Recent Labs Lab 01/11/16 1953 01/13/16 0300 01/14/16 0412 01/15/16 0359  01/16/16 0307  NA 142 140 140 138 136  K 4.1 4.2 3.6 3.9 4.0  CL 110 110 107 103 104  CO2 23 22 23 24 25   GLUCOSE 108* 112* 110* 114* 112*  BUN 20 19 15 18 18   CREATININE 1.59* 1.44* 1.35* 1.41* 1.26*  CALCIUM 8.9 8.8* 8.6* 8.6* 8.4*    Liver Function Tests:  Recent Labs Lab 01/11/16 1953  AST 30  ALT 20  ALKPHOS 107  BILITOT 0.4  PROT 6.6  ALBUMIN 3.4*   No results for input(s): LIPASE, AMYLASE in the last 168 hours. No results for input(s): AMMONIA in the last 168 hours.  CBC:  Recent Labs Lab 01/12/16 0251 01/13/16 0300 01/14/16 0412 01/15/16 0359 01/16/16 0307  WBC 5.1 6.0 6.8 6.1 5.9  NEUTROABS 3.5 4.2 4.8 4.2 4.0  HGB 9.1* 9.8* 10.5* 9.9* 9.4*  HCT 28.8* 31.5* 33.2* 31.5* 30.3*  MCV 93.5 92.6 90.5 90.3 91.3  PLT 136* 161 147* 129* 132*    INR:  Recent Labs Lab 01/12/16 1814 01/13/16 0300 01/14/16 0412 01/15/16 0359 01/16/16 0307  INR 1.88* 1.91* 1.72* 1.61* 1.86*    Other results:    Imaging: No results found.   Medications:     Scheduled Medications: . sodium chloride   Intravenous Once  . atorvastatin  80 mg Oral q1800  . ferrous gluconate  324 mg Oral BID WC  . levothyroxine  200 mcg Oral QAC breakfast  . metoprolol  succinate  50 mg Oral BID  . pantoprazole  40 mg Oral BID  . ranolazine  500 mg Oral BID  . sodium chloride flush  3 mL Intravenous Q12H  . Warfarin - Pharmacist Dosing Inpatient   Does not apply q1800    Infusions: . sodium chloride Stopped (01/14/16 0800)  . amiodarone 30 mg/hr (01/16/16 0700)  . heparin 1,300 Units/hr (01/16/16 0700)  . phenylephrine (NEO-SYNEPHRINE) Adult infusion      PRN Medications: morphine injection, ondansetron, oxyCODONE-acetaminophen, promethazine, sodium chloride flush   Assessment:   1. High-grade R internal carotid stenosis    --s/p R CEA 01/13/16 2. 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 3. Atrial arrhythmias: Atrial fibrillation, atrial tachycardia. S/p  DC-CV 06/04/14 and repeat DC-CV 03/02/15.    --Remains in RVR despite amio. 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 Will be hard to rate control atrial tachycardia.  On metoprolol XL.      --plan TEE/DC-CV today  4. CKD, stage III 5. 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 Now on Plavix and warfarin at home.  6. Lumbar compression fracture: This is major limiting factor for him. Follows with Dr. Vertell Limber. Pending surgery once recovered from carotid surgery   Plan/Discussion:    Volume looks ok today.  With PI and MAP lower, will not give Lasix again today.    Remains in  A tach despite amiodarone and metoprolol. Do not want to push dose given h/o amio thyroid toxicity. Plan TEE/DC-CV today.  Will keep NPO.     INR currently 1.86 on warfarin. On Heparin.  INR goal 2-2.5   Hgb trending down over the last 3 days. 10.5>9.9>9.4   I reviewed the LVAD parameters from today, and compared the results to the patient's prior recorded data.  No programming changes were made.  The LVAD is functioning within specified parameters.  The patient performs LVAD self-test daily.  LVAD interrogation was negative for any significant power changes, alarms or PI events/speed drops.  LVAD equipment check completed and is in good working order.  Back-up equipment present.   LVAD education done on emergency procedures and precautions and reviewed exit site care.  Length of Stay: 5  Amy Clegg NP-C  01/16/2016, 7:29 AM  VAD Team --- VAD ISSUES ONLY--- Pager [phone] (7am - 7am)  Advanced Heart Failure Team  Pager [phone] (M-F; 7a - 4p)  Please contact [facility] for night-coverage after hours (4p -7a ) and weekends on [email or web address]   Patient seen and examined with Frank Grinder, NP. We discussed all aspects of the encounter. I agree with the assessment and plan as stated above.   He is feeling better. Denies SOB or palpitations. Hgb drifting down but no significant operative site bleeding. Drain has  been removed. With INR 1.86 will stop heparin. Continue coumadin. Plan TEE and DC-CV tomorrow if INR>=2.0. Continue low dose amio. Volume status stable. VAD parameters and MAPs stable. Continue to ambulate.   [name], Daniel,MD 9:29 PM

## 2016-01-17 NOTE — Transfer of Care (Signed)
Immediate Anesthesia Transfer of Care Note  Case cancelled

## 2016-01-17 NOTE — Progress Notes (Signed)
Patient ID: [name], male   DOB: 1941-09-08, 75 y.o.   MRN: [id number]  VAD TEAM ROUNDING NOTE  HeartMate 2 Rounding Note  Subjective:    Underwent R CEA 99991111 without complication.   Got 1u RBCs in PACU. Post-op developed respiratory distress and was given IV lasix. Out > 1L.   Started back on amio (despite h/o amio thyroid toxicity) on 3/10 due to atrial tach with persistent RVR despite diltiazem at 10.   Remains in A tach despite amio drip and metoprolol XL. Last nights dose of metoprolol was held due to because his MAP was < 70.   Overall feeling ok. Denies SOB/Orthopnea.    LVAD INTERROGATION:  HeartMate II LVAD:  Flow 3.7 liters/min, speed 9000, power 4.5, PI 3.9. No PI events  Objective:    Vital Signs:   Temp:  [98.1 F (36.7 C)-98.3 F (36.8 C)] 98.3 F (36.8 C) (03/14 0342) Pulse Rate:  [75-119] 116 (03/14 0700) Resp:  [11-24] 20 (03/14 0700) SpO2:  [89 %-96 %] 94 % (03/14 0700) Arterial Line BP: (65-89)/(61-77) 76/71 mmHg (03/14 0700) Weight:  [200 lb 8 oz (90.946 kg)] 200 lb 8 oz (90.946 kg) (03/14 0700) Last BM Date: 01/15/16 Mean arterial Pressure 60-70s  Intake/Output:   Intake/Output Summary (Last 24 hours) at 01/17/16 0731 Last data filed at 01/17/16 0700  Gross per 24 hour  Intake 760.05 ml  Output    150 ml  Net 610.05 ml     Physical Exam: General:  Lying in bed. awake HEENT: normal Neck: JVP 10-12 cm. R CEA incision approximated. Mild swelling. No exudate.  Carotids 2+ bilat; no bruits. No thyromegaly or nodule noted Cor: Mechanical heart sounds with LVAD hum present. Lungs: CTAB, normal effort Abdomen: soft, NT, ND, no HSM. No bruits or masses. +BS . Driveline: C/D/I; securement device intact and driveline incorporated Extremities: no cyanosis, clubbing, rash.  No edema. Neuro: alert & orientedx3, cranial nerves grossly intact. moves all 4 extremities w/o difficulty. Affect pleasant  Telemetry: Atrial tachycardia  110s  Labs: Basic Metabolic Panel:  Recent Labs Lab 01/13/16 0300 01/14/16 0412 01/15/16 0359 01/16/16 0307 01/17/16 0504  NA 140 140 138 136 140  K 4.2 3.6 3.9 4.0 4.3  CL 110 107 103 104 105  CO2 22 23 24 25 26   GLUCOSE 112* 110* 114* 112* 103*  BUN 19 15 18 18 17   CREATININE 1.44* 1.35* 1.41* 1.26* 1.06  CALCIUM 8.8* 8.6* 8.6* 8.4* 8.6*    Liver Function Tests:  Recent Labs Lab 01/11/16 1953  AST 30  ALT 20  ALKPHOS 107  BILITOT 0.4  PROT 6.6  ALBUMIN 3.4*   No results for input(s): LIPASE, AMYLASE in the last 168 hours. No results for input(s): AMMONIA in the last 168 hours.  CBC:  Recent Labs Lab 01/12/16 0251 01/13/16 0300 01/14/16 0412 01/15/16 0359 01/16/16 0307  WBC 5.1 6.0 6.8 6.1 5.9  NEUTROABS 3.5 4.2 4.8 4.2 4.0  HGB 9.1* 9.8* 10.5* 9.9* 9.4*  HCT 28.8* 31.5* 33.2* 31.5* 30.3*  MCV 93.5 92.6 90.5 90.3 91.3  PLT 136* 161 147* 129* 132*    INR:  Recent Labs Lab 01/12/16 1814 01/13/16 0300 01/14/16 0412 01/15/16 0359 01/16/16 0307  INR 1.88* 1.91* 1.72* 1.61* 1.86*    Other results:    Imaging: No results found.   Medications:     Scheduled Medications: . sodium chloride   Intravenous Once  . atorvastatin  80 mg Oral q1800  .  ferrous gluconate  324 mg Oral BID WC  . guaiFENesin  600 mg Oral BID  . levothyroxine  200 mcg Oral QAC breakfast  . metoprolol succinate  50 mg Oral BID  . pantoprazole  40 mg Oral BID  . ranolazine  500 mg Oral BID  . sodium chloride flush  3 mL Intravenous Q12H  . Warfarin - Pharmacist Dosing Inpatient   Does not apply q1800    Infusions: . sodium chloride Stopped (01/14/16 0800)  . amiodarone 30 mg/hr (01/17/16 0700)  . phenylephrine (NEO-SYNEPHRINE) Adult infusion      PRN Medications: benzonatate, morphine injection, ondansetron, oxyCODONE-acetaminophen, promethazine, sodium chloride flush   Assessment:   1. High-grade R internal carotid stenosis    --s/p R CEA 01/13/16 2.  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 3. Atrial arrhythmias: Atrial fibrillation, atrial tachycardia. S/p DC-CV 06/04/14 and repeat DC-CV 03/02/15.    --Remains in RVR despite amio. 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 Will be hard to rate control atrial tachycardia.  On metoprolol XL.      --plan TEE/DC-CV today  4. CKD, stage III 5. 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 Now on Plavix and warfarin at home.  6. Lumbar compression fracture: This is major limiting factor for him. Follows with Dr. Vertell Limber. Pending surgery once recovered from carotid surgery   Plan/Discussion:    Volume looks ok today.  Maps 60-70s with A line. Hopefully will improve after cardioversion. No diuretics today. .    Remains in  A tach despite amiodarone and metoprolol. Do not want to push dose given h/o amio thyroid toxicity. Plan TEE/DC-CV today at 1000.  Will keep NPO.     INR pending. On warfarin. INR goal 2-2.5   Hgb pending.    I reviewed the LVAD parameters from today, and compared the results to the patient's prior recorded data.  No programming changes were made.  The LVAD is functioning within specified parameters.  The patient performs LVAD self-test daily.  LVAD interrogation was negative for any significant power changes, alarms or PI events/speed drops.  LVAD equipment check completed and is in good working order.  Back-up equipment present.   LVAD education done on emergency procedures and precautions and reviewed exit site care.  Length of Stay: 6  Amy Clegg NP-C  01/17/2016, 7:31 AM  VAD Team --- VAD ISSUES ONLY--- Pager [phone] (7am - 7am)  Advanced Heart Failure Team  Pager [phone] (M-F; 7a - 4p)  Please contact [facility] for night-coverage after hours (4p -7a ) and weekends on [email or web address]  Patient seen with NP, agree with the above note.  Had planned cardioversion for atrial tachycardia today.  However, INR 1.92.  Reviewed electrograms with [employer]  rep, appears to have atrial flutter not atrial tachycardia.  Think that in absence of heparin gtt (holding off with recent surgery) that we should wait until INR > 2.  Procedure cancelled until tomorrow, hopefully INR > 2.  Will re-schedule.    Patient reports cough/sputum.  Optivol shows volume up and JVP high.  Suspect significant volume overload.  MAP now in 70s-80s. - Will get CXR.  Has history of amiodarone toxicity but suspect current issue is volume overload.   - Lasix 40 mg IV bid today, strict I/Os.   Continue Toprol XL and amiodarone today, remains in mild RVR, plan TEE-guided DCCV tomorrow when INR > 2.   Loralie Champagne 01/17/2016 11:42 AM

## 2016-01-17 NOTE — Progress Notes (Addendum)
[place] for warfarin Indication: VAD  Allergies  Allergen Reactions  . Demerol [Meperidine] Other (See Comments)    Paralysis. Could only move eyes.    Patient Measurements: Height: 6' (182.9 cm) Weight: 200 lb 8 oz (90.946 kg) IBW/kg (Calculated) : 77.6 Heparin dosing weight: 88.8  Vital Signs: Temp: 98.2 F (36.8 C) (03/14 0908) Temp Source: Oral (03/14 0908) Pulse Rate: 116 (03/14 0700)  Labs:  Recent Labs  01/15/16 0359 01/15/16 1700 01/16/16 0307 01/17/16 0504 01/17/16 0831  HGB 9.9*  --  9.4*  --  10.0*  HCT 31.5*  --  30.3*  --  30.1*  PLT 129*  --  132*  --  134*  LABPROT 19.2*  --  21.4*  --  21.9*  INR 1.61*  --  1.86*  --  1.92*  HEPARINUNFRC  --  0.10* 0.36  --   --   CREATININE 1.41*  --  1.26* 1.06  --     Estimated Creatinine Clearance: 65.1 mL/min (by C-G formula based on Cr of 1.06).  Assessment: 75yo male with LVAD presents s/p carotid endarterectomy 3/10. Pharmacy consulted to dose warfarin.  PMH carotid stenosis, HFrEF (EF 20-25%) s/p HMII (10/2013), atrial arrhythmias, CKD III, hx GIB, lumbar compression fx  INR 1.86 today. Warfarin restarted on 3/11 with heparin bridge until INR > 1.92 (trending up nicely). hgb 10, plt 134. No noted bleeding. Given hx of GIB, recent endarterectomy and INR > 1.8, heparin discontinued.  PTA warfarin dose 4 mg qMon, 2 mg all other days  Goal of Therapy:  INR goal 2-2.5  Monitor platelets by anticoagulation protocol: Yes   Plan: Warfarin 2 mg x 1 tonight Daily INR, CBC Monitor s/sx bleeding  Melburn Popper, PharmD Clinical Pharmacy Resident Pager: [phone] 01/17/2016 9:24 AM   Received 2 mg stat in endo for planned DCCV - okay to also received the additional 2 mg tonight for total daily dose of 4mg  today. Will follow up with am INR.  Melburn Popper

## 2016-01-18 LAB — BASIC METABOLIC PANEL
Anion gap: 10 (ref 5–15)
BUN: 16 mg/dL (ref 6–20)
CALCIUM: 8.4 mg/dL — AB (ref 8.9–10.3)
CO2: 27 mmol/L (ref 22–32)
CREATININE: 1.21 mg/dL (ref 0.61–1.24)
Chloride: 99 mmol/L — ABNORMAL LOW (ref 101–111)
GFR calc Af Amer: >60 mL/min (ref >60)
GFR, EST NON AFRICAN AMERICAN: 56 mL/min — AB (ref >60)
Glucose, Bld: 102 mg/dL — ABNORMAL HIGH (ref 65–99)
Potassium: 3.8 mmol/L (ref 3.5–5.1)
SODIUM: 136 mmol/L (ref 135–145)

## 2016-01-18 LAB — CBC
HCT: 30.1 % — ABNORMAL LOW (ref 39.0–52.0)
Hemoglobin: 9.5 g/dL — ABNORMAL LOW (ref 13.0–17.0)
MCH: 28.6 pg (ref 26.0–34.0)
MCHC: 31.6 g/dL (ref 30.0–36.0)
MCV: 90.7 fL (ref 78.0–100.0)
PLATELETS: 137 10*3/uL — AB (ref 150–400)
RBC: 3.32 MIL/uL — ABNORMAL LOW (ref 4.22–5.81)
RDW: 16.5 % — ABNORMAL HIGH (ref 11.5–15.5)
WBC: 5.2 10*3/uL (ref 4.0–10.5)

## 2016-01-18 LAB — LACTATE DEHYDROGENASE: LDH: 210 U/L — AB (ref 98–192)

## 2016-01-18 LAB — PROTIME-INR
INR: 2.22 — ABNORMAL HIGH (ref 0.00–1.49)
PROTHROMBIN TIME: 24.4 s — AB (ref 11.6–15.2)

## 2016-01-18 LAB — MAGNESIUM: Magnesium: 1.9 mg/dL (ref 1.7–2.4)

## 2016-01-18 MED ORDER — SODIUM CHLORIDE 0.9% FLUSH: 3.0000 mL | INTRAVENOUS | Status: DC | PRN

## 2016-01-18 MED ORDER — SODIUM CHLORIDE 0.9 % IV SOLN: 250.0000 mL | INTRAVENOUS | Status: DC

## 2016-01-18 MED ORDER — SODIUM CHLORIDE 0.9 % IV SOLN
250.0000 mL | INTRAVENOUS | Status: DC
  Administered 2016-01-19: 500 mL via INTRAVENOUS

## 2016-01-18 MED ORDER — POTASSIUM CHLORIDE CRYS ER 20 MEQ PO TBCR
40.0000 meq | EXTENDED_RELEASE_TABLET | Freq: Once | ORAL | Status: AC
  Administered 2016-01-18: 40 meq via ORAL
  Filled 2016-01-18: qty 2

## 2016-01-18 MED ORDER — WARFARIN SODIUM 3 MG PO TABS: 3.0000 mg | ORAL_TABLET | Freq: Once | ORAL | Status: DC

## 2016-01-18 MED ORDER — WARFARIN SODIUM 2 MG PO TABS
2.0000 mg | ORAL_TABLET | Freq: Once | ORAL | Status: AC
  Administered 2016-01-18: 2 mg via ORAL
  Filled 2016-01-18: qty 1

## 2016-01-18 MED ORDER — SODIUM CHLORIDE 0.9% FLUSH
3.0000 mL | Freq: Two times a day (BID) | INTRAVENOUS | Status: DC
  Administered 2016-01-18 (×2): 3 mL via INTRAVENOUS

## 2016-01-18 NOTE — Clinical Documentation Improvement (Signed)
Cardiology Vascular Surgery  Can the diagnosis of Respiratory Distress be further specified?        Respiratory Failure  Document Acuity - Acute, Chronic, Acute on Chronic  Document Inclusion Of - Hypoxia, Hypercapnia, Combination of Both  Other  Clinically Undetermined  Document any associated diagnoses/conditions. Please update your documentation within the medical record to reflect your response to this query. Thank you.  Supporting Information: (as per notes) "Post-op developed respiratory distress and was given IV lasix."  O2 @ 4L/min O2 sats droped to 90   Please exercise your independent, professional judgment when responding. A specific answer is not anticipated or expected.  Thank You, Alessandra Grout, RN, BSN, CCDS,Clinical Documentation Specialist:  [phone]  [phone]=[facility]- Health Information Management

## 2016-01-18 NOTE — Progress Notes (Addendum)
[place] for warfarin Indication: VAD  Allergies  Allergen Reactions  . Demerol [Meperidine] Other (See Comments)    Paralysis. Could only move eyes.    Patient Measurements: Height: 6' (182.9 cm) Weight: 192 lb (87.091 kg) IBW/kg (Calculated) : 77.6 Heparin dosing weight: 88.8  Vital Signs: Temp: 98.3 F (36.8 C) (03/15 0800) Temp Source: Oral (03/15 0800) Pulse Rate: 117 (03/15 0900)  Labs:  Recent Labs  01/15/16 1700  01/16/16 0307 01/17/16 0504 01/17/16 0831 01/18/16 0435  HGB  --   < > 9.4*  --  10.0* 9.5*  HCT  --   --  30.3*  --  30.1* 30.1*  PLT  --   --  132*  --  134* 137*  LABPROT  --   --  21.4*  --  21.9* 24.4*  INR  --   --  1.86*  --  1.92* 2.22*  HEPARINUNFRC 0.10*  --  0.36  --   --   --   CREATININE  --   --  1.26* 1.06  --  1.21  < > = values in this interval not displayed.  Estimated Creatinine Clearance: 57 mL/min (by C-G formula based on Cr of 1.21).  Assessment: 75yo male with LVAD presents s/p carotid endarterectomy 3/10. Pharmacy consulted to dose warfarin.  PMH carotid stenosis, HFrEF (EF 20-25%) s/p HMII (10/2013), atrial arrhythmias, CKD III, hx GIB, lumbar compression fx  INR 2.22 today. Warfarin restarted on 3/11 with heparin bridge until INR > 1.8 (trending up nicely). hgb 9.5, plt wnl. No noted bleeding. Given hx of GIB, recent endarterectomy and INR > 1.8, heparin discontinued.  PTA warfarin dose 4 mg qMon, 2 mg all other days  Goal of Therapy:  INR goal 2-2.5  Monitor platelets by anticoagulation protocol: Yes   Plan: Warfarin 2mg  x 1 tonight Daily INR, CBC Monitor s/sx bleeding  Melburn Popper, PharmD Clinical Pharmacy Resident Pager: [phone] 01/18/2016 9:14 AM

## 2016-01-18 NOTE — Progress Notes (Signed)
Patient ID: [name], male   DOB: [birth year], 75 y.o.   MRN: [id number]  VAD TEAM ROUNDING NOTE  HeartMate 2 Rounding Note  Subjective:    Underwent R CEA 99991111 without complication.   Got 1u RBCs in PACU. Post-op developed respiratory distress and was given IV lasix. Out > 1L.   Started back on amio (despite h/o amio thyroid toxicity) on 3/10 due to atrial tach with persistent RVR despite diltiazem at 10.   CXR 01/17/16 with increased small right pleural effusion. Stable cardiomegaly and mild diffuse interstitial edema pattern  Remains in A tach despite amio drip and metoprolol XL.   Feels good overall. Wants to get DCCV over with.  Has an eye doctors appointment tomorrow. Back surgery set for 02/03/16. States he's fine with staying in hospital as long as it takes.    INR 2.2. TEE/DCCV cancelled yesterday with sub therapeutic INR.   LVAD INTERROGATION:  HeartMate II LVAD:  Flow 3.4 liters/min, speed 9000, power 4.4, PI 4.1. No PI events  Objective:    Vital Signs:   Temp:  [97 F (36.1 C)-98.7 F (37.1 C)] 98.7 F (37.1 C) (03/15 0413) Pulse Rate:  [25-120] 114 (03/15 0700) Resp:  [11-27] 15 (03/15 0700) SpO2:  [92 %-100 %] 100 % (03/15 0700) Arterial Line BP: (64-101)/(59-81) 67/63 mmHg (03/15 0700) Last BM Date: 01/15/16 Mean arterial Pressure 60-70s  Intake/Output:   Intake/Output Summary (Last 24 hours) at 01/18/16 0755 Last data filed at 01/18/16 0700  Gross per 24 hour  Intake 881.36 ml  Output   3825 ml  Net -2943.64 ml     Physical Exam: General:  Lying in bed. awake HEENT: normal Neck: JVP 8-9 cm. R CEA incision approximated. Mild swelling. No exudate.  Carotids 2+ bilat; no bruits. No thyromegaly or nodule noted Cor: Mechanical heart sounds with LVAD hum present. Lungs: Clear Abdomen: soft, non-tender, non-distended, no HSM. No bruits or masses. +BS . Driveline: C/D/I; securement device intact and driveline incorporated Extremities: no cyanosis,  clubbing, rash.  No edema. Neuro: alert & orientedx3, cranial nerves grossly intact. moves all 4 extremities w/o difficulty. Affect pleasant  Telemetry: Atrial tachycardia 110-110ss  Labs: Basic Metabolic Panel:  Recent Labs Lab 01/14/16 0412 01/15/16 0359 01/16/16 0307 01/17/16 0504 01/18/16 0435  NA 140 138 136 140 136  K 3.6 3.9 4.0 4.3 3.8  CL 107 103 104 105 99*  CO2 23 24 25 26 27   GLUCOSE 110* 114* 112* 103* 102*  BUN 15 18 18 17 16   CREATININE 1.35* 1.41* 1.26* 1.06 1.21  CALCIUM 8.6* 8.6* 8.4* 8.6* 8.4*    Liver Function Tests:  Recent Labs Lab 01/11/16 1953  AST 30  ALT 20  ALKPHOS 107  BILITOT 0.4  PROT 6.6  ALBUMIN 3.4*   No results for input(s): LIPASE, AMYLASE in the last 168 hours. No results for input(s): AMMONIA in the last 168 hours.  CBC:  Recent Labs Lab 01/12/16 0251 01/13/16 0300 01/14/16 0412 01/15/16 0359 01/16/16 0307 01/17/16 0831 01/18/16 0435  WBC 5.1 6.0 6.8 6.1 5.9 5.8 5.2  NEUTROABS 3.5 4.2 4.8 4.2 4.0  --   --   HGB 9.1* 9.8* 10.5* 9.9* 9.4* 10.0* 9.5*  HCT 28.8* 31.5* 33.2* 31.5* 30.3* 30.1* 30.1*  MCV 93.5 92.6 90.5 90.3 91.3 90.4 90.7  PLT 136* 161 147* 129* 132* 134* 137*    INR:  Recent Labs Lab 01/14/16 0412 01/15/16 0359 01/16/16 0307 01/17/16 0831 01/18/16 0435  INR 1.72*  1.61* 1.86* 1.92* 2.22*    Other results:    Imaging: Dg Chest Port 1 View  01/17/2016  CLINICAL DATA:  Followup congestive heart failure. EXAM: PORTABLE CHEST 1 VIEW COMPARISON:  09/21/2015 FINDINGS: Mild cardiomegaly is stable. Left ventricular assist device and AICD remains in appropriate position. Increased small right pleural effusion noted. Mild diffuse interstitial prominence shows no significant change, consistent with mild interstitial edema. No evidence of pulmonary consolidation. Prior median sternotomy again noted. IMPRESSION: Increased small right pleural effusion. Stable cardiomegaly and mild diffuse interstitial edema  pattern. Electronically Signed   By: Earle Gell M.D.   On: 01/17/2016 12:57     Medications:     Scheduled Medications: . sodium chloride   Intravenous Once  . atorvastatin  80 mg Oral q1800  . ferrous gluconate  324 mg Oral BID WC  . furosemide  40 mg Intravenous BID  . guaiFENesin  600 mg Oral BID  . levothyroxine  200 mcg Oral QAC breakfast  . metoprolol succinate  50 mg Oral BID  . pantoprazole  40 mg Oral BID  . ranolazine  500 mg Oral BID  . sodium chloride flush  3 mL Intravenous Q12H  . sodium chloride flush  3 mL Intravenous Q12H  . Warfarin - Pharmacist Dosing Inpatient   Does not apply q1800    Infusions: . sodium chloride Stopped (01/14/16 0800)  . sodium chloride    . sodium chloride Stopped (01/17/16 1230)  . amiodarone 30 mg/hr (01/18/16 0700)  . phenylephrine (NEO-SYNEPHRINE) Adult infusion      PRN Medications: benzonatate, morphine injection, ondansetron, oxyCODONE-acetaminophen, promethazine, sodium chloride flush, sodium chloride flush   Assessment:   1. High-grade R internal carotid stenosis    --s/p R CEA 01/13/16 2. 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 3. Atrial arrhythmias: Atrial fibrillation, atrial tachycardia. S/p DC-CV 06/04/14 and repeat DC-CV 03/02/15.    --Remains in RVR despite amio. 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 Will be hard to rate control atrial tachycardia.  On metoprolol XL.      --plan TEE/DC-CV today  4. CKD, stage III 5. 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 Now on Plavix and warfarin at home.  6. Lumbar compression fracture: This is major limiting factor for him. Follows with Dr. Vertell Limber. Pending surgery once recovered from carotid surgery   Plan/Discussion:    Volume still up slightly. Will give 40 mg IV lasix at least this am and reassess for evening dose. Maps 60-70s with A line. Hope this will improve after cardioversion.   Remains in  A tach despite amiodarone and metoprolol. Do  not want to push dose given h/o amio thyroid toxicity. Plan TEE/DC-CV once spot available. Will keep NPO for now.  Will follow up once Endo staff in house.   INR 2.22 this am. On warfarin. INR goal 2-2.5   Hgb stable at 9.5.    I reviewed the LVAD parameters from today, and compared the results to the patient's prior recorded data.  No programming changes were made.  The LVAD is functioning within specified parameters.  The patient performs LVAD self-test daily.  LVAD interrogation was negative for any significant power changes, alarms or PI events/speed drops.  LVAD equipment check completed and is in good working order.  Back-up equipment present.   LVAD education done on emergency procedures and precautions and reviewed exit site care.  Length of Stay: 7  Shirley Friar PA-C 01/18/2016, 7:55 AM  VAD Team --- VAD ISSUES ONLY--- Pager [phone] (7am - 7am)  Advanced Heart Failure Team  Pager [phone] (M-F; 7a - 4p)  Please contact [facility] for night-coverage after hours (4p -7a ) and weekends on [email or web address]  Patient seen and examined with Oda Kilts, PA-C. We discussed all aspects of the encounter. I agree with the assessment and plan as stated above.   Agree with one dose of lasix.   Remains in atrial tach/AFL. No room on endo schedule today. Plan TEE and DC-CV tomorrow. INR now therapeutic. Switch amio to po in am. VAD parameters stable.   Tanairi Cypert,MD 4:26 PM

## 2016-01-19 ENCOUNTER — Inpatient Hospital Stay (HOSPITAL_COMMUNITY): Payer: Medicare Other | Admitting: Anesthesiology

## 2016-01-19 ENCOUNTER — Inpatient Hospital Stay (HOSPITAL_COMMUNITY): Payer: Medicare Other

## 2016-01-19 ENCOUNTER — Encounter (HOSPITAL_COMMUNITY)
Admission: RE | Disposition: A | Discharge status: Home / Self Care | Payer: Self-pay | Source: Ambulatory Visit | Attending: Internal Medicine

## 2016-01-19 DIAGNOSIS — I4891 Unspecified atrial fibrillation: Secondary | ICD-10-CM

## 2016-01-19 DIAGNOSIS — I4892 Unspecified atrial flutter: Secondary | ICD-10-CM

## 2016-01-19 HISTORY — PX: TEE WITHOUT CARDIOVERSION: SHX5443

## 2016-01-19 HISTORY — PX: CARDIOVERSION: SHX1299

## 2016-01-19 LAB — BASIC METABOLIC PANEL
ANION GAP: 11 (ref 5–15)
BUN: 18 mg/dL (ref 6–20)
CALCIUM: 8.7 mg/dL — AB (ref 8.9–10.3)
CO2: 28 mmol/L (ref 22–32)
Chloride: 99 mmol/L — ABNORMAL LOW (ref 101–111)
Creatinine, Ser: 1.29 mg/dL — ABNORMAL HIGH (ref 0.61–1.24)
GFR calc Af Amer: >60 mL/min (ref >60)
GFR calc non Af Amer: 52 mL/min — ABNORMAL LOW (ref >60)
GLUCOSE: 106 mg/dL — AB (ref 65–99)
Potassium: 4.1 mmol/L (ref 3.5–5.1)
Sodium: 138 mmol/L (ref 135–145)

## 2016-01-19 LAB — CBC
HEMATOCRIT: 31.3 % — AB (ref 39.0–52.0)
Hemoglobin: 9.7 g/dL — ABNORMAL LOW (ref 13.0–17.0)
MCH: 28.3 pg (ref 26.0–34.0)
MCHC: 31 g/dL (ref 30.0–36.0)
MCV: 91.3 fL (ref 78.0–100.0)
Platelets: 127 10*3/uL — ABNORMAL LOW (ref 150–400)
RBC: 3.43 MIL/uL — ABNORMAL LOW (ref 4.22–5.81)
RDW: 16.5 % — AB (ref 11.5–15.5)
WBC: 5.3 10*3/uL (ref 4.0–10.5)

## 2016-01-19 LAB — LACTATE DEHYDROGENASE: LDH: 217 U/L — ABNORMAL HIGH (ref 98–192)

## 2016-01-19 LAB — PROTIME-INR
INR: 2.52 — ABNORMAL HIGH (ref 0.00–1.49)
Prothrombin Time: 26.9 seconds — ABNORMAL HIGH (ref 11.6–15.2)

## 2016-01-19 SURGERY — ECHOCARDIOGRAM, TRANSESOPHAGEAL
Anesthesia: Monitor Anesthesia Care

## 2016-01-19 MED ORDER — AMIODARONE HCL 200 MG PO TABS: 400.0000 mg | ORAL_TABLET | Freq: Two times a day (BID) | ORAL | Status: DC

## 2016-01-19 MED ORDER — BUTAMBEN-TETRACAINE-BENZOCAINE 2-2-14 % EX AERO
INHALATION_SPRAY | CUTANEOUS | Status: DC | PRN
  Administered 2016-01-19: 2 via TOPICAL

## 2016-01-19 MED ORDER — MIDAZOLAM HCL 5 MG/5ML IJ SOLN
INTRAMUSCULAR | Status: DC | PRN
  Administered 2016-01-19: 0.5 mg via INTRAVENOUS

## 2016-01-19 MED ORDER — FENTANYL CITRATE (PF) 100 MCG/2ML IJ SOLN
INTRAMUSCULAR | Status: DC | PRN
  Administered 2016-01-19: 50 ug via INTRAVENOUS

## 2016-01-19 MED ORDER — FUROSEMIDE 10 MG/ML IJ SOLN: 40.0000 mg | Freq: Once | INTRAMUSCULAR | Status: DC

## 2016-01-19 MED ORDER — WARFARIN SODIUM 2 MG PO TABS
2.0000 mg | ORAL_TABLET | Freq: Once | ORAL | Status: AC
  Administered 2016-01-19: 2 mg via ORAL
  Filled 2016-01-19: qty 1

## 2016-01-19 MED ORDER — PROPOFOL 10 MG/ML IV BOLUS
INTRAVENOUS | Status: DC | PRN
  Administered 2016-01-19: 20 mg via INTRAVENOUS
  Administered 2016-01-19: 10 mg via INTRAVENOUS

## 2016-01-19 NOTE — Progress Notes (Signed)
  Echocardiogram Echocardiogram Transesophageal has been performed.  [name] 01/19/2016, 2:25 PM

## 2016-01-19 NOTE — Progress Notes (Signed)
Patient ID: [name], male   DOB: 1941/07/17, 75 y.o.   MRN: [id number]  VAD TEAM ROUNDING NOTE  HeartMate 2 Rounding Note  Subjective:    Underwent R CEA 99991111 without complication.   Got 1u RBCs in PACU. Post-op developed respiratory distress and was given IV lasix. Out > 1L.   Started back on amio (despite h/o amio thyroid toxicity) on 3/10 due to atrial tach with persistent RVR despite diltiazem at 10.   CXR 01/17/16 with increased small right pleural effusion. Stable cardiomegaly and mild diffuse interstitial edema pattern  Remains in A tach despite amio drip and metoprolol XL.   Out 2 L with IV lasix yesterday.  Feels like it helped.  Home weights were 188-193, higher end of that today. Overall feeling OK, ready to get DCCV over with.   INR 2.52. TEE/DCCV re-scheduled for today.    LVAD INTERROGATION:  HeartMate II LVAD:  Flow 3.5 liters/min, speed 9000, power 4.4, PI 4.6. 6 PI events  Objective:    Vital Signs:   Temp:  [97.5 F (36.4 C)-99.2 F (37.3 C)] 98.6 F (37 C) (03/16 0700) Pulse Rate:  [84-199] 114 (03/16 0600) Resp:  [12-21] 14 (03/16 0600) SpO2:  [83 %-100 %] 100 % (03/16 0600) Arterial Line BP: (66-114)/(61-98) 80/73 mmHg (03/16 0600) Weight:  [193 lb 5.5 oz (87.7 kg)] 193 lb 5.5 oz (87.7 kg) (03/16 0600) Last BM Date: 01/18/16 Mean arterial Pressure 60-70s  Intake/Output:   Intake/Output Summary (Last 24 hours) at 01/19/16 0809 Last data filed at 01/19/16 0600  Gross per 24 hour  Intake  367.4 ml  Output   1700 ml  Net -1332.6 ml     Physical Exam: General:  Lying in bed. NAD HEENT: normal Neck: JVP 7-8 cm. R CEA incision approximated. Mild swelling. No exudate.  Carotids 2+ bilat; no bruits. No thyromegaly or nodule noted Cor: Mechanical heart sounds with LVAD hum present. Lungs: CTAB, normal effort Abdomen: soft, NT, ND, no HSM. No bruits or masses. +BS  Driveline: C/D/I; securement device intact and driveline  incorporated Extremities: no cyanosis, clubbing, rash.  No edema. Neuro: alert & orientedx3, cranial nerves grossly intact. moves all 4 extremities w/o difficulty. Affect pleasant  Telemetry: Reviewed, remains in Atrial tachycardia 110s  Labs: Basic Metabolic Panel:  Recent Labs Lab 01/15/16 0359 01/16/16 0307 01/17/16 0504 01/18/16 0435 01/18/16 1320 01/19/16 0458  NA 138 136 140 136  --  138  K 3.9 4.0 4.3 3.8  --  4.1  CL 103 104 105 99*  --  99*  CO2 24 25 26 27   --  28  GLUCOSE 114* 112* 103* 102*  --  106*  BUN 18 18 17 16   --  18  CREATININE 1.41* 1.26* 1.06 1.21  --  1.29*  CALCIUM 8.6* 8.4* 8.6* 8.4*  --  8.7*  MG  --   --   --   --  1.9  --     Liver Function Tests: No results for input(s): AST, ALT, ALKPHOS, BILITOT, PROT, ALBUMIN in the last 168 hours. No results for input(s): LIPASE, AMYLASE in the last 168 hours. No results for input(s): AMMONIA in the last 168 hours.  CBC:  Recent Labs Lab 01/13/16 0300 01/14/16 0412 01/15/16 0359 01/16/16 0307 01/17/16 0831 01/18/16 0435 01/19/16 0458  WBC 6.0 6.8 6.1 5.9 5.8 5.2 5.3  NEUTROABS 4.2 4.8 4.2 4.0  --   --   --   HGB 9.8* 10.5* 9.9*  9.4* 10.0* 9.5* 9.7*  HCT 31.5* 33.2* 31.5* 30.3* 30.1* 30.1* 31.3*  MCV 92.6 90.5 90.3 91.3 90.4 90.7 91.3  PLT 161 147* 129* 132* 134* 137* 127*    INR:  Recent Labs Lab 01/15/16 0359 01/16/16 0307 01/17/16 0831 01/18/16 0435 01/19/16 0458  INR 1.61* 1.86* 1.92* 2.22* 2.52*    Other results:    Imaging: Dg Chest Port 1 View  01/17/2016  CLINICAL DATA:  Followup congestive heart failure. EXAM: PORTABLE CHEST 1 VIEW COMPARISON:  09/21/2015 FINDINGS: Mild cardiomegaly is stable. Left ventricular assist device and AICD remains in appropriate position. Increased small right pleural effusion noted. Mild diffuse interstitial prominence shows no significant change, consistent with mild interstitial edema. No evidence of pulmonary consolidation. Prior median  sternotomy again noted. IMPRESSION: Increased small right pleural effusion. Stable cardiomegaly and mild diffuse interstitial edema pattern. Electronically Signed   By: Earle Gell M.D.   On: 01/17/2016 12:57     Medications:     Scheduled Medications: . sodium chloride   Intravenous Once  . atorvastatin  80 mg Oral q1800  . ferrous gluconate  324 mg Oral BID WC  . furosemide  40 mg Intravenous BID  . guaiFENesin  600 mg Oral BID  . levothyroxine  200 mcg Oral QAC breakfast  . metoprolol succinate  50 mg Oral BID  . pantoprazole  40 mg Oral BID  . ranolazine  500 mg Oral BID  . sodium chloride flush  3 mL Intravenous Q12H  . sodium chloride flush  3 mL Intravenous Q12H  . sodium chloride flush  3 mL Intravenous Q12H  . Warfarin - Pharmacist Dosing Inpatient   Does not apply q1800    Infusions: . sodium chloride Stopped (01/14/16 0800)  . sodium chloride    . sodium chloride Stopped (01/17/16 1230)  . sodium chloride    . amiodarone 30 mg/hr (01/19/16 0600)  . phenylephrine (NEO-SYNEPHRINE) Adult infusion      PRN Medications: benzonatate, morphine injection, ondansetron, oxyCODONE-acetaminophen, promethazine, sodium chloride flush, sodium chloride flush, sodium chloride flush   Assessment:   1. High-grade R internal carotid stenosis    --s/p R CEA 01/13/16 2. 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 3. Atrial arrhythmias: Atrial fibrillation, atrial tachycardia. S/p DC-CV 06/04/14 and repeat DC-CV 03/02/15.    --Remains in RVR despite amio. 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 Will be hard to rate control atrial tachycardia.  On metoprolol XL.      --plan TEE/DC-CV today  4. CKD, stage III 5. 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 Now on Plavix and warfarin at home.  6. Lumbar compression fracture: This is major limiting factor for him. Follows with Dr. Vertell Limber. Pending surgery once recovered from carotid surgery   Plan/Discussion:     Remains in  A tach despite amiodarone and metoprolol. TEE/DCCV for this afternoon.    He remains mildly volume overload. Will schedule one further dose of 40 mg IV lasix after DCCV this afternoon.   Will discuss optimal amio dose with h/o amio thyroid toxicity.   INR 2.52 this am. On warfarin. INR goal 2-2.5   Hgb stable at 9.7  Likely home tomorrow.   I reviewed the LVAD parameters from today, and compared the results to the patient's prior recorded data.  No programming changes were made.  The LVAD is functioning within specified parameters.  The patient performs LVAD self-test daily.  LVAD interrogation was negative for any significant power  changes, alarms or PI events/speed drops.  LVAD equipment check completed and is in good working order.  Back-up equipment present.   LVAD education done on emergency procedures and precautions and reviewed exit site care.  Length of Stay: 8  Annamaria Helling 01/19/2016, 8:09 AM  VAD Team --- VAD ISSUES ONLY--- Pager [phone] (7am - 7am)  Advanced Heart Failure Team  Pager [phone] (M-F; 7a - 4p)  Please contact [facility] for night-coverage after hours (4p -7a ) and weekends on [email or web address]   Patient seen and examined with Oda Kilts, PA-C. We discussed all aspects of the encounter. I agree with the assessment and plan as stated above.   He remains in atrial flutter. INR 2.52. Will plan TEE /DC-CV today. Switch amio to 100 po daily. Would hold off on further diuresis until after DC-CV. R CEA site looks good. Continue ambulation. MAPs and VAD parameters ok.   Bensimhon, Daniel,MD 10:14 AM

## 2016-01-19 NOTE — Progress Notes (Signed)
TEE/cardioversion cancelled today due to subtherapeutic INR.  Patient was in Endoscopy to be cardioverted after TEE.  Pre procedure was completed, anesthesia department set up procedure room and saw patient.  He was then moved to procedure room.  The heart rhythm was then discovered to be atrial flutter which was not to be converted with current INR. Patient was returned to ICU and procedure was rescheduled.

## 2016-01-19 NOTE — Progress Notes (Signed)
UR Completed. Adhya Cocco, RN, BSN.  [phone] 

## 2016-01-19 NOTE — Transfer of Care (Signed)
Immediate Anesthesia Transfer of Care Note  Patient: [name]  Procedure(s) Performed: Procedure(s): TRANSESOPHAGEAL ECHOCARDIOGRAM (TEE) (N/A) CARDIOVERSION (N/A)  Patient Location: PACU  Anesthesia Type:General  Level of Consciousness: awake, alert , oriented and patient cooperative  Airway & Oxygen Therapy: Patient Spontanous Breathing and Patient connected to nasal cannula oxygen  Post-op Assessment: Report given to RN and Post -op Vital signs reviewed and stable  Post vital signs: Reviewed and stable  Last Vitals:  Filed Vitals:   01/19/16 1232 01/19/16 1415  BP: 77/72 80/68  Pulse:  62  Temp:    Resp:  32    Complications: No apparent anesthesia complications

## 2016-01-19 NOTE — Progress Notes (Signed)
Pt transported to Endo. VSS. Awaiting LVAD coordinator to assist with procedure. Will continue to watch pt closely.

## 2016-01-19 NOTE — Anesthesia Preprocedure Evaluation (Addendum)
Anesthesia Evaluation  Patient identified by MRN, date of birth, ID band Patient awake    Reviewed: Allergy & Precautions, NPO status , Patient's Chart, lab work & pertinent test results  History of Anesthesia Complications Negative for: history of anesthetic complications  Airway Mallampati: II  TM Distance: >3 FB Neck ROM: Full    Dental no notable dental hx.    Pulmonary sleep apnea , former smoker,    Pulmonary exam normal breath sounds clear to auscultation       Cardiovascular hypertension, + CAD, + Past MI, + Cardiac Stents, + Peripheral Vascular Disease and +CHF  + dysrhythmias Atrial Fibrillation + Cardiac Defibrillator  Rhythm:Regular Rate:Normal + Systolic murmurs Pt has Heartmate II LVAD  Baseline EF 10-15%   Neuro/Psych CVA negative psych ROS   GI/Hepatic negative GI ROS, Neg liver ROS,   Endo/Other  Hypothyroidism   Renal/GU negative Renal ROS  negative genitourinary   Musculoskeletal negative musculoskeletal ROS (+) Arthritis ,   Abdominal   Peds negative pediatric ROS (+)  Hematology  (+) Blood dyscrasia (Hb 9.7, Coumadin with INR 2.52), anemia ,   Anesthesia Other Findings Recent has been in afib/aflutter with RVR HR in 120s  Patient with ICD in place A line in left radial artery From volume standpoint looks dry to normal, does not appear volume overloaded  VAD: ROM 8900, Flow 4.3, PI 4-5 Will provide albumin, phenylephrine support as needed  Reproductive/Obstetrics negative OB ROS                           Anesthesia Physical  Anesthesia Plan  ASA: IV  Anesthesia Plan: MAC   Post-op Pain Management:    Induction: Intravenous  Airway Management Planned: Nasal Cannula  Additional Equipment:   Intra-op Plan:   Post-operative Plan:   Informed Consent: I have reviewed the patients History and Physical, chart, labs and discussed the procedure including the  risks, benefits and alternatives for the proposed anesthesia with the patient or authorized representative who has indicated his/her understanding and acceptance.   Dental advisory given  Plan Discussed with: CRNA and Surgeon  Anesthesia Plan Comments: (Rec 2mg  midazolam, lidocaine 2% viscous gargle, propofol bolus's as needed 20-30mg  at a time)        Anesthesia Quick Evaluation

## 2016-01-19 NOTE — Anesthesia Postprocedure Evaluation (Signed)
Anesthesia Post Note  Patient: [name]  Procedure(s) Performed: Procedure(s) (LRB): TRANSESOPHAGEAL ECHOCARDIOGRAM (TEE) (N/A) CARDIOVERSION (N/A)  Patient location during evaluation: Endoscopy Anesthesia Type: MAC Level of consciousness: awake and alert, oriented and patient cooperative Pain management: pain level controlled Vital Signs Assessment: post-procedure vital signs reviewed and stable Respiratory status: spontaneous breathing, nonlabored ventilation, respiratory function stable and patient connected to nasal cannula oxygen Cardiovascular status: blood pressure returned to baseline and stable Postop Assessment: no signs of nausea or vomiting Anesthetic complications: no    Last Vitals:  Filed Vitals:   01/19/16 1232 01/19/16 1415  BP: 77/72 80/68  Pulse:  62  Temp:    Resp:  32    Last Pain:  Filed Vitals:   01/19/16 1424  PainSc: 0-No pain                 Soffia Doshier,E. Ethelreda Sukhu

## 2016-01-19 NOTE — Procedures (Signed)
Electrical Cardioversion Procedure Note [name] [id number] 1941/04/03  Procedure: Electrical Cardioversion Indications:  Atrial Flutter  Procedure Details Consent: Risks of procedure as well as the alternatives and risks of each were explained to the (patient/caregiver).  Consent for procedure obtained. Time Out: Verified patient identification, verified procedure, site/side was marked, verified correct patient position, special equipment/implants available, medications/allergies/relevent history reviewed, required imaging and test results available.  Performed  Patient placed on cardiac monitor, pulse oximetry, supplemental oxygen as necessary.  Sedation given: Propofol Pacer pads placed anterior and posterior chest.  Cardioverted 1 time(s).  Cardioverted at 150J.  Evaluation Findings: Post procedure EKG shows: NSR Complications: None Patient did tolerate procedure well.  PPM was interrogated afterwards, lower rate limit set to 60 bpm to limit RV pacing.    Loralie Champagne 01/19/2016, 2:15 PM

## 2016-01-19 NOTE — Progress Notes (Signed)
[facility] for warfarin Indication: VAD  Allergies  Allergen Reactions  . Demerol [Meperidine] Other (See Comments)    Paralysis. Could only move eyes.    Patient Measurements: Height: 6' (182.9 cm) Weight: 193 lb 5.5 oz (87.7 kg) IBW/kg (Calculated) : 77.6 Heparin dosing weight: 88.8  Vital Signs: Temp: 98.6 F (37 C) (03/16 0700) Temp Source: Oral (03/16 0400) Pulse Rate: 113 (03/16 0800)  Labs:  Recent Labs  01/17/16 0504  01/17/16 0831 01/18/16 0435 01/19/16 0458  HGB  --   < > 10.0* 9.5* 9.7*  HCT  --   --  30.1* 30.1* 31.3*  PLT  --   --  134* 137* 127*  LABPROT  --   --  21.9* 24.4* 26.9*  INR  --   --  1.92* 2.22* 2.52*  CREATININE 1.06  --   --  1.21 1.29*  < > = values in this interval not displayed.  Estimated Creatinine Clearance: 53.5 mL/min (by C-G formula based on Cr of 1.29).  Assessment: 75yo male with LVAD presents s/p carotid endarterectomy 3/10. Pharmacy consulted to dose warfarin.  PMH carotid stenosis, HFrEF (EF 20-25%) s/p HMII (10/2013), atrial arrhythmias, CKD III, hx GIB, lumbar compression fx  INR 2.52 today. Warfarin restarted on 3/11 with heparin bridge until INR > 1.8 (trending up nicely). hgb 9.7, plt wnl. No noted bleeding. Given hx of GIB, recent endarterectomy and INR > 1.8, heparin discontinued.  PTA warfarin dose 4 mg qMon, 2 mg all other days  Goal of Therapy:  INR goal 2-2.5  Monitor platelets by anticoagulation protocol: Yes   Plan: Warfarin 2mg  x 1 tonight Daily INR, CBC Monitor s/sx bleeding  Melburn Popper, PharmD Clinical Pharmacy Resident Pager: [phone] 01/19/2016 9:18 AM

## 2016-01-19 NOTE — Progress Notes (Signed)
EKG CRITICAL VALUE     12 lead EKG performed.  Critical value noted.  Baxter Flattery, RN notified.   Yehuda Mao, [place] 01/19/2016 8:31 AM
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
## 2016-01-19 NOTE — Care Management Important Message (Signed)
Important Message  Patient Details  Name: [name] MRN: [id number] Date of Birth: 10-25-1941   Medicare Important Message Given:  Yes    Barb Merino Oniyah Rohe 01/19/2016, 3:11 PM

## 2016-01-20 ENCOUNTER — Encounter (HOSPITAL_COMMUNITY): Payer: Self-pay | Admitting: Cardiology

## 2016-01-20 ENCOUNTER — Other Ambulatory Visit (HOSPITAL_COMMUNITY): Payer: Self-pay | Admitting: *Deleted

## 2016-01-20 DIAGNOSIS — I4892 Unspecified atrial flutter: Secondary | ICD-10-CM

## 2016-01-20 DIAGNOSIS — Z95811 Presence of heart assist device: Secondary | ICD-10-CM

## 2016-01-20 DIAGNOSIS — Z7901 Long term (current) use of anticoagulants: Secondary | ICD-10-CM

## 2016-01-20 DIAGNOSIS — I480 Paroxysmal atrial fibrillation: Secondary | ICD-10-CM

## 2016-01-20 LAB — CBC
HEMATOCRIT: 29.8 % — AB (ref 39.0–52.0)
HEMATOCRIT: 30.8 % — AB (ref 39.0–52.0)
HEMOGLOBIN: 9.8 g/dL — AB (ref 13.0–17.0)
HEMOGLOBIN: 10.1 g/dL — AB (ref 13.0–17.0)
MCH: 29.9 pg (ref 26.0–34.0)
MCH: 29.8 pg (ref 26.0–34.0)
MCHC: 32.9 g/dL (ref 30.0–36.0)
MCHC: 32.8 g/dL (ref 30.0–36.0)
MCV: 90.9 fL (ref 78.0–100.0)
MCV: 90.9 fL (ref 78.0–100.0)
Platelets: 33 10*3/uL — ABNORMAL LOW (ref 150–400)
Platelets: 144 10*3/uL — ABNORMAL LOW (ref 150–400)
RBC: 3.28 MIL/uL — AB (ref 4.22–5.81)
RBC: 3.39 MIL/uL — AB (ref 4.22–5.81)
RDW: 16.4 % — ABNORMAL HIGH (ref 11.5–15.5)
RDW: 16.5 % — ABNORMAL HIGH (ref 11.5–15.5)
WBC: 5.7 10*3/uL (ref 4.0–10.5)
WBC: 7.2 10*3/uL (ref 4.0–10.5)

## 2016-01-20 LAB — BASIC METABOLIC PANEL
ANION GAP: 10 (ref 5–15)
BUN: 15 mg/dL (ref 6–20)
CHLORIDE: 100 mmol/L — AB (ref 101–111)
CO2: 28 mmol/L (ref 22–32)
Calcium: 8.6 mg/dL — ABNORMAL LOW (ref 8.9–10.3)
Creatinine, Ser: 1.23 mg/dL (ref 0.61–1.24)
GFR calc non Af Amer: 55 mL/min — ABNORMAL LOW (ref >60)
GLUCOSE: 107 mg/dL — AB (ref 65–99)
Potassium: 4.5 mmol/L (ref 3.5–5.1)
Sodium: 138 mmol/L (ref 135–145)

## 2016-01-20 LAB — PROTIME-INR
INR: 2.73 — AB (ref 0.00–1.49)
Prothrombin Time: 28.5 seconds — ABNORMAL HIGH (ref 11.6–15.2)

## 2016-01-20 LAB — LACTATE DEHYDROGENASE: LDH: 218 U/L — ABNORMAL HIGH (ref 98–192)

## 2016-01-20 MED ORDER — AMIODARONE HCL 200 MG PO TABS: 200.0000 mg | ORAL_TABLET | Freq: Every day | ORAL | Status: DC

## 2016-01-20 MED ORDER — RANOLAZINE ER 500 MG PO TB12: ORAL_TABLET | ORAL | Status: DC

## 2016-01-20 MED ORDER — GUAIFENESIN ER 600 MG PO TB12: 600.0000 mg | ORAL_TABLET | Freq: Two times a day (BID) | ORAL | Status: DC

## 2016-01-20 NOTE — Progress Notes (Signed)
VAD Coordinator Procedure Note:   Patient underwent TEE/CV in endo procedure room. Hemodynamics and VAD parameters monitored by anesthesia and myself throughout the procedure. MAPs were obtained with left radial arterial line.       ALine BP: HR: O2 Sat: Flow:  PI: Power:      Speed:              Pre-procedure: 12:45pm  78/72 (75) 119 96%  3.6  4.4 4.6  9000 1:45   90/82 (85) 119 95  3.5  4.6 4.5 1:50   88/80  119 94  3.6  4.4 4.5   Sedation Induction: 2:00   69/63 (65) 100 95  3.4  4.5 4.5 2:05   (65)  116 89  3.3  3.2 4.4 2:10   (72)  114 94  3.8   5.3 4.5  Post procedure: 2:15   (76)  62 92  3.5  6.0 4.4  Recovery area: 2:20   81/20 (74) 61 93  3.9  5.6 4.7  Patient tolerated the procedure well..   Patient Disposition: 2S 10 Report to bedside nurse given by endo nurse. VAD coordinator called pt's wife per Dr. Claris Gladden request for update. Pt spoke with wife on the phone. Planned discharge home tomorrow discussed with [name].

## 2016-01-20 NOTE — Discharge Summary (Signed)
Advanced Heart Failure Team  Discharge Summary   Patient ID: [name] MRN: [id number], DOB/AGE: [birth year] 75 y.o. Admit date: 01/11/2016 D/C date:     01/20/2016   Primary Discharge Diagnoses:   1. High-grade R internal carotid stenosis   --s/p R CEA 01/13/16 2. Acute on Chronic systolic HF with acute hypoxic respiratory failure post-op: s/p LVAD 10/2014. 3. Atrial arrhythmias: Atrial flutter with RVR S/p DC-CV 06/04/14 and repeat DC-CV 03/02/15. 4. CKD, stage III 5. 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 Now on Plavix and warfarin at home.  6. Lumbar compression fracture: This is major limiting factor for him. Follows with Dr. Vertell Limber. Pending surgery once recovered from carotid surgery  Hospital Course:   [name] is a 75 yo male with h/o VT, PAD and severe systolic HF (EF 0000000) due to mixed ischemic/nonischemic CM he is s/p HM II LVAD implant for destination therapy on October 19, 2013.   Admitted 01/11/16 for pre-procedure optimization prior to R CEA.  On admission had persistent atrial flutter with rates of 120-150s.  Placed on dilt drip (in addition to po metoprolol) for rate control without effect. Initially held off on using amio with history of thyrotoxicity.   Underwent R CEA [id number] without complication. Got 1UPRBCs in PACU. He also developed respiratory distress after the procedure was and was given IV lasix with 1L of UO and symptomatic improvement. He remained in Atach/afib despite diltiazem gtt at 10. Was transitioned to IV amio with continued AF/Atach despite dual AV node blockade. Benefits of amio felt to outweigh risks of recurrent thyroid toxicity..   HR remained elevated despite amio over several days, so planned for DCCV 01/17/16 once INR therapeutic. Had to be postponed with continued sub-therapeutic INR. INR continue to trend towards therapeutic range and DCCV was completed 01/19/16 with prompt return to NSR after 1 shock at 150J.   The  morning of 01/20/16 he remained in NSR and was thought to be stable for discharge home. He will be kept on 200 mg amio to try and hold NSR for upcoming back surgery.  Will follow closely with history of thyroid toxicity.   Plavix will continue to be held with upcoming surgery.   He will be discharged to home in stable condition with follow up as below.   LVAD INTERROGATION:  HeartMate II LVAD: Flow 3.9 liters/min, speed 9000, power 4.6, PI 7.4. 3 PI events  Discharge Weight Range: 195 lbs Discharge Vitals: Blood pressure 80/68, pulse 68, temperature 97.6 F (36.4 C), temperature source Oral, resp. rate 14, height 6' (1.829 m), weight 195 lb 1.7 oz (88.5 kg), SpO2 93 %.  Labs: Lab Results  Component Value Date   WBC 7.2 01/20/2016   HGB 10.1* 01/20/2016   HCT 30.8* 01/20/2016   MCV 90.9 01/20/2016   PLT 144* 01/20/2016     Recent Labs Lab 01/20/16 0430  NA 138  K 4.5  CL 100*  CO2 28  BUN 15  CREATININE 1.23  CALCIUM 8.6*  GLUCOSE 107*   Lab Results  Component Value Date   CHOL 106 11/05/2015   HDL 33* 11/05/2015   LDLCALC 60 11/05/2015   TRIG 67 11/05/2015   BNP (last 3 results)  Recent Labs  09/21/15 0021 09/22/15 1330 10/07/15 1125  BNP 638.3* 707.6* 491.6*    ProBNP (last 3 results) No results for input(s): PROBNP in the last 8760 hours.   Diagnostic Studies/Procedures   No results found.  Discharge Medications  Medication List    STOP taking these medications        clopidogrel 75 MG tablet  Commonly known as:  PLAVIX      TAKE these medications        amiodarone 200 MG tablet  Commonly known as:  PACERONE  Take 1 tablet (200 mg total) by mouth daily.     amLODipine 10 MG tablet  Commonly known as:  NORVASC  Take 1 tablet (10 mg total) by mouth at bedtime.     atorvastatin 80 MG tablet  Commonly known as:  LIPITOR  Take 80 mg by mouth daily.     docusate sodium 100 MG capsule  Commonly known as:  COLACE  Take 100 mg by  mouth daily as needed for mild constipation.     ferrous gluconate 324 MG tablet  Commonly known as:  FERGON  Take 1 tablet (324 mg total) by mouth 2 (two) times daily with a meal.     furosemide 20 MG tablet  Commonly known as:  LASIX  Take 1 tablet (20 mg total) by mouth as needed for edema (weight gain).     guaiFENesin 600 MG 12 hr tablet  Commonly known as:  MUCINEX  Take 1 tablet (600 mg total) by mouth 2 (two) times daily.     ICAPS PO  Take 1 tablet by mouth 2 (two) times daily.     levothyroxine 200 MCG tablet  Commonly known as:  SYNTHROID, LEVOTHROID  Take one 200 mcg daily before breakfast every day with 1 1/2 (300 mcg) every Monday.     metoprolol succinate 25 MG 24 hr tablet  Commonly known as:  TOPROL-XL  Take 1 tablet (25 mg total) by mouth 2 (two) times daily.     nitroGLYCERIN 0.4 MG SL tablet  Commonly known as:  NITROSTAT  Place 0.4 mg under the tongue every 5 (five) minutes as needed for chest pain.     pantoprazole 40 MG tablet  Commonly known as:  PROTONIX  TAKE 1 TABLET EVERY 12 HOURS.     promethazine 12.5 MG tablet  Commonly known as:  PHENERGAN  Take 12.5 mg by mouth every 6 (six) hours as needed for nausea or vomiting.     ranolazine 500 MG 12 hr tablet  Commonly known as:  RANEXA  TAKE 1 TABLET (500 MG TOTAL) BY MOUTH 2 (TWO) TIMES DAILY.     warfarin 2 MG tablet  Commonly known as:  COUMADIN  Take 2-4 mg by mouth daily. 4mg  on Mondays, 2mg  all other days     zinc gluconate 50 MG tablet  Take 50 mg by mouth 2 (two) times daily.        Disposition   The patient will be discharged in stable condition to home. Discharge Instructions    Diet - low sodium heart healthy    Complete by:  As directed      Heart Failure patients record your daily weight using the same scale at the same time of day    Complete by:  As directed      Increase activity slowly    Complete by:  As directed           Follow-up Information    Follow up with  Tinnie Gens, MD In 2 weeks.   Specialties:  Vascular Surgery, Interventional Cardiology, Cardiology   Why:  Our office will call you to arrange an appointment (sent)   Contact information:   [address] [address] [phone]       Follow up with [facility] On 01/26/2016.   Specialty:  Cardiology   Why:  at 1000 for post hospital follow up. Please bring all of your medications to your visit.  Will get EKG, BP, INR.    Contact information:   [id number] [place] [phone]        Duration of Discharge Encounter: Greater than 35 minutes   Signed, Annamaria Helling 01/20/2016, 10:34 AM  Patient seen and examined with Oda Kilts, PA-C. We discussed all aspects of the encounter. I agree with the assessment and plan as stated above.   I have edited the note with my changes.   Jazmene Racz,MD 1:34 PM

## 2016-01-20 NOTE — Progress Notes (Signed)
Patient ID: [name], male   DOB: 05/03/1941, 75 y.o.   MRN: [id number]  VAD TEAM ROUNDING NOTE  HeartMate 2 Rounding Note  Subjective:    Underwent R CEA [id number] without complication.   Got 1u RBCs in PACU. Post-op developed respiratory distress and was given IV lasix. Out > 1L.   Started back on amio (despite h/o amio thyroid toxicity) on 3/10 due to atrial tach with persistent RVR despite diltiazem at 10.   CXR 01/17/16 with increased small right pleural effusion. Stable cardiomegaly and mild diffuse interstitial edema pattern  S/p DCCV/TEE yesterday.  Remains in NSR today.   Feels good. Says wife will be here around 10-11. No complaints.  Denies CP, lightheadedness, dizziness, or SOB. Does have some upper respiratory congestion.   INR 2.73.   LVAD INTERROGATION:  HeartMate II LVAD:  Flow 3.9 liters/min, speed 9000, power 4.6, PI 7.4. 3 PI events  Objective:    Vital Signs:   Temp:  [97.6 F (36.4 C)-99 F (37.2 C)] 98.4 F (36.9 C) (03/17 0412) Pulse Rate:  [57-120] 63 (03/17 0700) Resp:  [11-32] 14 (03/17 0700) BP: (77-80)/(68-72) 80/68 mmHg (03/16 1415) SpO2:  [89 %-100 %] 90 % (03/17 0700) Arterial Line BP: (74-89)/(59-82) 85/66 mmHg (03/17 0700) Weight:  [195 lb 1.7 oz (88.5 kg)] 195 lb 1.7 oz (88.5 kg) (03/17 0600) Last BM Date: 01/19/16 Mean arterial Pressure 60-70s  Intake/Output:   Intake/Output Summary (Last 24 hours) at 01/20/16 0753 Last data filed at 01/20/16 0700  Gross per 24 hour  Intake  700.8 ml  Output    950 ml  Net -249.2 ml     Physical Exam: General:  Lying in bed. NAD HEENT: normal Neck: JVP 7-8 cm. R CEA incision approximated. Mild swelling. No exudate.  Carotids 2+ bilat; no bruits. No thyromegaly or nodule noted Cor: Mechanical heart sounds with LVAD hum present. Lungs: Clear Abdomen: soft, non-tender, non-distended, no HSM. No bruits or masses. +BS  Driveline: C/D/I; securement device intact and driveline  incorporated Extremities: no cyanosis, clubbing, rash.  No edema. Neuro: alert & orientedx3, cranial nerves grossly intact. moves all 4 extremities w/o difficulty. Affect pleasant  Telemetry: Reviewed, NSR 60-70s  Labs: Basic Metabolic Panel:  Recent Labs Lab 01/16/16 0307 01/17/16 0504 01/18/16 0435 01/18/16 1320 01/19/16 0458 01/20/16 0430  NA 136 140 136  --  138 138  K 4.0 4.3 3.8  --  4.1 4.5  CL 104 105 99*  --  99* 100*  CO2 25 26 27   --  28 28  GLUCOSE 112* 103* 102*  --  106* 107*  BUN 18 17 16   --  18 15  CREATININE 1.26* 1.06 1.21  --  1.29* 1.23  CALCIUM 8.4* 8.6* 8.4*  --  8.7* 8.6*  MG  --   --   --  1.9  --   --     Liver Function Tests: No results for input(s): AST, ALT, ALKPHOS, BILITOT, PROT, ALBUMIN in the last 168 hours. No results for input(s): LIPASE, AMYLASE in the last 168 hours. No results for input(s): AMMONIA in the last 168 hours.  CBC:  Recent Labs Lab 01/14/16 0412 01/15/16 0359 01/16/16 0307 01/17/16 0831 01/18/16 0435 01/19/16 0458 01/20/16 0430  WBC 6.8 6.1 5.9 5.8 5.2 5.3 5.7  NEUTROABS 4.8 4.2 4.0  --   --   --   --   HGB 10.5* 9.9* 9.4* 10.0* 9.5* 9.7* 9.8*  HCT 33.2* 31.5* 30.3* 30.1*  30.1* 31.3* 29.8*  MCV 90.5 90.3 91.3 90.4 90.7 91.3 90.9  PLT 147* 129* 132* 134* 137* 127* 33*    INR:  Recent Labs Lab 01/16/16 0307 01/17/16 0831 01/18/16 0435 01/19/16 0458 01/20/16 0430  INR 1.86* 1.92* 2.22* 2.52* 2.73*    Other results:    Imaging: No results found.   Medications:     Scheduled Medications: . sodium chloride   Intravenous Once  . atorvastatin  80 mg Oral q1800  . ferrous gluconate  324 mg Oral BID WC  . guaiFENesin  600 mg Oral BID  . levothyroxine  200 mcg Oral QAC breakfast  . metoprolol succinate  50 mg Oral BID  . pantoprazole  40 mg Oral BID  . ranolazine  500 mg Oral BID  . sodium chloride flush  3 mL Intravenous Q12H  . Warfarin - Pharmacist Dosing Inpatient   Does not apply q1800     Infusions: . sodium chloride Stopped (01/14/16 0800)  . phenylephrine (NEO-SYNEPHRINE) Adult infusion      PRN Medications: benzonatate, morphine injection, ondansetron, oxyCODONE-acetaminophen, promethazine, sodium chloride flush   Assessment:   1. High-grade R internal carotid stenosis    --s/p R CEA 01/13/16 2. 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 3. Atrial arrhythmias: Atrial fibrillation, atrial tachycardia. S/p DC-CV 06/04/14 and repeat DC-CV 03/02/15.    --Remains in RVR despite amio. 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 Will be hard to rate control atrial tachycardia.  On metoprolol XL.      --plan TEE/DC-CV today  4. CKD, stage III 5. 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 Now on Plavix and warfarin at home.  6. Lumbar compression fracture: This is major limiting factor for him. Follows with Dr. Vertell Limber. Pending surgery once recovered from carotid surgery   Plan/Discussion:    Volume status stable. He remains in NSR s/p DCCV yesterday.   Will go home on amio 200 mg daily.   INR 2.73 this am.  INR goal 2-2.5. Pharmacy dosing. Will hold dose tonight and recheck next week.   Hgb stable at 9.8  Platelets 127 -> 33. Likely erroneous. Will recheck.  144 on recheck.  Will send home.   Likely home today.   I reviewed the LVAD parameters from today, and compared the results to the patient's prior recorded data.  No programming changes were made.  The LVAD is functioning within specified parameters.  The patient performs LVAD self-test daily.  LVAD interrogation was negative for any significant power changes, alarms or PI events/speed drops.  LVAD equipment check completed and is in good working order.  Back-up equipment present.   LVAD education done on emergency procedures and precautions and reviewed exit site care.  Length of Stay: [address]  Shirley Friar PA-C 01/20/2016, 7:53 AM  VAD Team --- VAD ISSUES ONLY--- Pager [phone] (7am -  7am)  Advanced Heart Failure Team  Pager [phone] (M-F; 7a - 4p)  Please contact [facility] for night-coverage after hours (4p -7a ) and weekends on [email or web address]  Patient seen and examined with Oda Kilts, PA-C. We discussed all aspects of the encounter. I agree with the assessment and plan as stated above.   He is in NSR s/p DC-CV. INR ok. Volume status ok. Will send home on amio 200 daily to try and hold NSR for upcoming back surgery. Watch for recurrent thyroid toxicity.   Donnae Michels,MD 10:17 AM

## 2016-01-20 NOTE — Progress Notes (Addendum)
Patient discharged at this time, patient vital signs stable upon discharge.  Discharge instructions given to patient and wife. No further questions at this time. Patient taken with all LVAD equipment to car on battery via [place].  Patient transported safely without complications.

## 2016-01-20 NOTE — Care Management Note (Signed)
Case Management Note  Patient Details  Name: [name] MRN: [id number] Date of Birth: 02-10-1941  Subjective/Objective:       Adm for heparin before card surg, at fib             Action/Plan: lives w wife, pcp dr Kandice Robinsons   Expected Discharge Date:                  Expected Discharge Plan:  [place]  In-House Referral:     Discharge [place], HF Clinic (Already followed by HF clinic, LVAD implanted 2015)  Post Acute Care Choice:    Choice offered to:     DME Arranged:    DME Agency:     HH Arranged:    HH Agency:     Status of Service:  Completed, signed off  Medicare Important Message Given:  Yes Date Medicare IM Given:    Medicare IM give by:    Date Additional Medicare IM Given:    Additional Medicare Important Message give by:     If discussed at [place] of Stay Meetings, dates discussed:  01/19/16  Additional Comments:ur review done  [name], RN 01/20/2016, 11:06 AM

## 2016-01-24 ENCOUNTER — Ambulatory Visit (HOSPITAL_COMMUNITY): Payer: Self-pay | Admitting: Unknown Physician Specialty

## 2016-01-24 LAB — POCT INR: INR: 2.6

## 2016-01-25 ENCOUNTER — Encounter: Payer: Self-pay | Admitting: Vascular Surgery

## 2016-01-25 ENCOUNTER — Ambulatory Visit: Payer: Medicare Other | Admitting: Neurology

## 2016-01-26 ENCOUNTER — Other Ambulatory Visit: Payer: Self-pay

## 2016-01-26 ENCOUNTER — Other Ambulatory Visit (HOSPITAL_COMMUNITY): Payer: Self-pay | Admitting: *Deleted

## 2016-01-26 ENCOUNTER — Ambulatory Visit (HOSPITAL_COMMUNITY)
Admit: 2016-01-26 | Discharge: 2016-01-26 | Disposition: A | Discharge status: Home / Self Care | Payer: Medicare Other | Source: Ambulatory Visit | Attending: Cardiology | Admitting: Cardiology

## 2016-01-26 DIAGNOSIS — R9431 Abnormal electrocardiogram [ECG] [EKG]: Secondary | ICD-10-CM | POA: Diagnosis not present

## 2016-01-26 DIAGNOSIS — Z95811 Presence of heart assist device: Secondary | ICD-10-CM

## 2016-01-26 DIAGNOSIS — I5023 Acute on chronic systolic (congestive) heart failure: Secondary | ICD-10-CM | POA: Diagnosis not present

## 2016-01-26 DIAGNOSIS — I5022 Chronic systolic (congestive) heart failure: Secondary | ICD-10-CM | POA: Insufficient documentation

## 2016-01-26 LAB — BASIC METABOLIC PANEL
ANION GAP: 8 (ref 5–15)
BUN: 18 mg/dL (ref 6–20)
CALCIUM: 9 mg/dL (ref 8.9–10.3)
CHLORIDE: 106 mmol/L (ref 101–111)
CO2: 25 mmol/L (ref 22–32)
Creatinine, Ser: 1.65 mg/dL — ABNORMAL HIGH (ref 0.61–1.24)
GFR calc non Af Amer: 39 mL/min — ABNORMAL LOW (ref >60)
GFR, EST AFRICAN AMERICAN: 45 mL/min — AB (ref >60)
Glucose, Bld: 97 mg/dL (ref 65–99)
Potassium: 4.6 mmol/L (ref 3.5–5.1)
Sodium: 139 mmol/L (ref 135–145)

## 2016-01-26 LAB — CBC
HCT: 33.7 % — ABNORMAL LOW (ref 39.0–52.0)
HEMOGLOBIN: 10.5 g/dL — AB (ref 13.0–17.0)
MCH: 28.6 pg (ref 26.0–34.0)
MCHC: 31.2 g/dL (ref 30.0–36.0)
MCV: 91.8 fL (ref 78.0–100.0)
Platelets: 213 10*3/uL (ref 150–400)
RBC: 3.67 MIL/uL — AB (ref 4.22–5.81)
RDW: 17.1 % — ABNORMAL HIGH (ref 11.5–15.5)
WBC: 8 10*3/uL (ref 4.0–10.5)

## 2016-01-26 LAB — LACTATE DEHYDROGENASE: LDH: 257 U/L — AB (ref 98–192)

## 2016-01-26 NOTE — Progress Notes (Signed)
Pt here for hospital d/c f/u with EKG and BP check.    Vital signs: HR: 61 MAP BP:  90 O2 Sat:  92/71 (78) Wt:  195.6 lbs Last wt: 195 lbs Ht:  6'  LVAD interrogation reveals:  Speed:  8990 Flow: 4.0 Power: 4.7   PI: 6.1 Alarms:  none Events:  0 - 5 Fixed speed:  9000 Low speed limit: 8400 Primary Controller:  Replace back up battery in 30 months. Back up controller:   Replace back up battery in 30 months.  EKG reviewed by Dr. Aundra Dubin - pt is A paced.  Optivol obtained and reviewed by Dr. Aundra Dubin - no afib noted since cardioversion 01/19/16.  Labs reviewed by Dr. Aundra Dubin - will hold diuretics until admission next Wednesday for bridge with heparin for back surgery scheduled 02/03/16.  Will check home INR Monday and call us results.

## 2016-01-26 NOTE — H&P (Signed)
Patient ID:   [id number] Patient: [name]  Date of Birth: 1941/02/10 Visit Type: Office Visit   Date: 12/12/2015 04:00 PM Provider: Marchia Meiers. Vertell Limber MD   This 75 year old male presents for back pain.  History of Present Illness: 1.  back pain  The patient returns and continues to complain of disabling back pain.  He had a mild stroke on December 30 and had some transient weakness in his left arm and left leg but this has completely resolved.  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  ET did not show any gross abnormality but this is not a subtle test for assessing a small stroke.  I discussed the patient's situation with his cardiology team and we recommended holding off on surgery because of his recent stroke.  He now comes in a partially 6 weeks out from this event and says he is completely back to normal.  He continues to complain of severe low back pain and says that he is disabled as a result of this.  At this point I think it is reasonable to proceed with surgery although I again advised him of the risks of surgery and general anesthesia was could certainly lead to death.  The patient understands these risks and wishes to proceed with surgery.  He will need to come into the hospital for anticoagulation management before surgery and will proceed with L3 through L5 instrumented fusion with posterolateral arthrodesis.  I reviewed new lumbar imaging and these do not show significant worsening of the compression fracture.  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      Medical/Surgical/Interim History Reviewed, no change.  Last detailed document date:09/26/2015.   PAST MEDICAL HISTORY, SURGICAL HISTORY, FAMILY HISTORY, SOCIAL HISTORY AND REVIEW OF SYSTEMS I have reviewed the patient's past medical, surgical, family and social history as well as the comprehensive review of systems as  included on the [facility] history form dated 09/26/2015, which I have signed.  Family History: Reviewed, no changes.  Last detailed document: 09/26/2015.   Social History: Tobacco use reviewed. Reviewed, no changes. Last detailed document date: 09/26/2015.      MEDICATIONS(added, continued or stopped this visit): Started Medication Directions Instruction Stopped   amlodipine 5 mg tablet take 1 tablet by oral route  every day     atorvastatin 80 mg tablet take 1 tablet by oral route  every day     Colace take 1 capsule by oral route 2 times every day     ICaps Take twice daily     iron take 1 tablet by oral route 2 times every day     levothyroxine 200 mcg tablet take 1 tablet by oral route  every day     multivitamin Take once daily     pantoprazole 40 mg tablet,delayed release take 1 tablet by oral route 2 times every day     Ranexa 500 mg tablet,extended release take 1 tablet by oral route 2 times every day     WARFARIN SODIUM Take 4mg  on Tuesday and Thursday and 2mg  all other days     zinc 50 mg tablet Take twice daily       ALLERGIES: Ingredient Reaction Medication Name Comment  MEPERIDINE HCL  DEMEROL    Reviewed, no changes.    Vitals Date Temp F BP Pulse Ht In Wt Lb  BMI BSA Pain Score  12/12/2015    72 198 26.85  7/10      IMPRESSION The patient is a small stroke from which he appears to have recovered.  He is still having disabling back pain and wants to proceed with surgery.  Comments:  Will be coordinated with Dr. Haroldine Laws & LVAD coordinator  Completed Orders (this encounter) Order Details Reason Side Interpretation Result Initial Treatment Date Region  Lumbar Spine- AP/Lat/Flex/Ex      12/12/2015   Lifestyle education regarding diet Encouraged to eat a well balanced diet and follow up with primary care physician.         Assessment/Plan # Detail Type Description   1. Assessment LVAD (left ventricular assist device)  present (Z95.811).       2. Assessment Low back pain (M54.5).       3. Assessment Body mass index (BMI) 26.0-26.9, adult [id number]).   Plan Orders Today's instructions / counseling include(s) Lifestyle education regarding diet.       4. Assessment Lumbar compression fracture, closed, initial encounter (S32.000A).         Pain Assessment/Treatment Pain Scale: 7/10. Method: Numeric Pain Intensity Scale. Location: back. Onset: 02/11/2015. Duration: varies. Quality: discomforting. Pain Assessment/Treatment follow-up plan of care: Patient is taking medications as prescribed..  Fall Risk Plan The patient has not fallen in the last year.  L3 through L5 posterior instrumented fusion with posterolateral arthrodesis.  Risks and benefits were discussed in detail with the patient wished to proceed.  Orders: Diagnostic Procedures: Assessment Procedure  M54.5 Lumbar Spine- AP/Lat/Flex/Ex  S32.000A Lumbar Fusion - L3 - L4 - L5 (no interbodies)  S32.000A posterior lateral fusion - L3 - L5  Instruction(s)/Education: Assessment Instruction  [phone] Lifestyle education regarding diet             Provider:  Marchia Meiers. Vertell Limber MD  12/17/2015 01:19 PM Dictation edited by: Marchia Meiers. Vertell Limber    CC Providers: Shaune Pascal Bensimhon [address] [place], [place] [address]-              Electronically signed by Marchia Meiers Vertell Limber MD on 12/17/2015 01:19 PM  Patient ID:   [id number] Patient: [name]  Date of Birth: 08/14/1941 Visit Type: Office Visit   Date: 09/26/2015 12:00 PM Provider: Marchia Meiers. Vertell Limber MD   This 75 year old male presents for back pain.  History of Present Illness: 1.  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  He states initial injury was February 11, 2015 after which he healed and return to work.  Unfortunately on his first day back at work, June 13, 2015, he fell backwards down a flight of  stairs.  Back pain prompted imaging revealing worsening fracture.  Tylenol only as needed  History: Heart pump (LVAD) two years ago, HTN, MI, brainstem stroke 2000, COPD Surgical history: Listed in Epic  Lumbar CT on [place]  I reviewed the patient's CT scan of the lumbar spine which shows a healed L4 compression fracture with necrosis of the superior aspect of the vertebra including the superior endplate.  The patient is currently describing 6 out of 10 pain and says that this is persisting since April.  He says he can no longer do anything.  He has had a LVAD for the last 2 years and has been doing well with this.  He is on Coumadin for chronic anticoagulation for his left ventricular assist device.  I reviewed the patient's imaging studies and explained to him that there would be no option of kyphoplasty procedure given the sclerotic margin and absent superior aspect of the vertebra.  I told him that if he wants to get some relief this would likely require posterior lumbar fusion with implanted hardware without interbody fusion.  The patient feels that he is unable to tolerate his current level of discomfort in the deep does want to proceed with surgery.        PAST MEDICAL/SURGICAL HISTORY   (Detailed)  Disease/disorder Onset Date Management Date Comments  High cholesterol      Hypertension      Thyroid disease         PAST MEDICAL HISTORY, SURGICAL HISTORY, FAMILY HISTORY, SOCIAL HISTORY AND REVIEW OF SYSTEMS I have reviewed the patient's past medical, surgical, family and social history as well as the comprehensive review of systems as included on the [facility] history form dated 09/26/2015, which I have signed.  Family History  (Detailed) Relationship Family Member Name Deceased Age at Death Condition Onset Age Cause of Death      Family history of Hypertension  N  Father    Stroke  N  Mother    Congestive heart failure  N    SOCIAL HISTORY   (Detailed) Tobacco use reviewed. Preferred language is Unknown.   Smoking status: Former smoker.  SMOKING STATUS Use Status Type Smoking Status Usage Per Day Years Used Total Pack Years  yes  Former smoker       HOME ENVIRONMENT/SAFETY  The Patient has fallen 2 times in the last year.  The fall(s) resulted in injury.  Details: fracture.       MEDICATIONS(added, continued or stopped this visit): Started Medication Directions Instruction Stopped   amlodipine 5 mg tablet take 1 tablet by oral route  every day     atorvastatin 80 mg tablet take 1 tablet by oral route  every day     Colace take 1 capsule by oral route 2 times every day     ICaps Take twice daily     iron take 1 tablet by oral route 2 times every day     levothyroxine 200 mcg tablet take 1 tablet by oral route  every day     multivitamin Take once daily     pantoprazole 40 mg tablet,delayed release take 1 tablet by oral route 2 times every day     Ranexa 500 mg tablet,extended release take 1 tablet by oral route 2 times every day     warfarin Take 4mg  on Tuesday and Thursday and 2mg  all other days     zinc 50 mg tablet Take twice daily       ALLERGIES: Ingredient Reaction Medication Name Comment  MEPERIDINE HCL  DEMEROL    Reviewed, updated.   REVIEW OF SYSTEMS System Neg/Pos Details  Constitutional Negative Chills, fatigue, fever, malaise, night sweats, weight gain and weight loss.  ENMT Negative Ear drainage, hearing loss, nasal drainage, otalgia, sinus pressure and sore throat.  Eyes Negative Eye discharge, eye pain and vision changes.  Respiratory Negative Chronic cough, cough, dyspnea, known TB exposure and wheezing.  Cardio Positive LVAD.  GI Negative Abdominal pain, blood in stool, change in stool pattern, constipation, decreased appetite, diarrhea, heartburn, nausea and vomiting.  GU Negative Dribbling, dysuria, erectile dysfunction, hematuria, polyuria, slow stream, urinary frequency, urinary  incontinence and urinary retention.  Endocrine Negative Cold intolerance, heat intolerance, polydipsia  and polyphagia.  Neuro Negative Dizziness, extremity weakness, gait disturbance, headache, memory impairment, numbness in extremity, seizures and tremors.  Psych Negative Anxiety, depression and insomnia.  Integumentary Negative Brittle hair, brittle nails, change in shape/size of mole(s), hair loss, hirsutism, hives, pruritus, rash and skin lesion.  MS Positive Back pain.  Hema/Lymph Negative Easy bleeding, easy bruising and lymphadenopathy.  Allergic/Immuno Negative Contact allergy, environmental allergies, food allergies and seasonal allergies.  Reproductive Negative Penile discharge and sexual dysfunction.     Vitals Date Temp F BP Pulse Ht In Wt Lb BMI BSA Pain Score  09/26/2015    72 197 26.72  6/10     PHYSICAL EXAM General Level of Distress: no acute distress Overall Appearance: normal    Cardiovascular Cardiac: distant heart sounds appreciated over the atria with a continuous smooth electrical rumble   auscultated most loudly adjacent to the LVAD  Respiratory Lungs: clear to auscultation  Neurological Recent and Remote Memory: normal Attention Span and Concentration:   normal Language: normal Fund of Knowledge: normal  Right Left Sensation: normal normal Upper Extremity Coordination: normal normal  Lower Extremity Coordination: normal normal  Musculoskeletal Gait and Station: normal  Right Left Upper Extremity Muscle Strength: normal normal Lower Extremity Muscle Strength: normal normal Upper Extremity Muscle Tone:  normal normal Lower Extremity Muscle Tone: normal normal  Motor Strength Upper and lower extremity motor strength was tested in the clinically pertinent muscles.     Deep Tendon  Reflexes  Right Left Biceps: normal normal Triceps: normal normal Brachiloradialis: normal normal Patellar: normal normal Achilles: normal normal  Sensory Sensation was tested at L1 to S1.   Cranial Nerves II. Optic Nerve/Visual Fields: normal III. Oculomotor: normal IV. Trochlear: normal V. Trigeminal: normal VI. Abducens: normal VII. Facial: normal VIII. Acoustic/Vestibular: normal IX. Glossopharyngeal: normal X. Vagus: normal XI. Spinal Accessory: normal XII. Hypoglossal: normal  Motor and other Tests Lhermittes: negative Rhomberg: negative    Right Left Hoffman's: normal normal Clonus: normal normal Babinski: normal normal SLR: negative negative Patrick's Corky Sox): negative negative Toe Walk: normal normal Toe Lift: normal normal Heel Walk: normal normal SI Joint: nontender nontender   Additional Findings:  Mechanical low back pain just above the lumbosacral junction with limitations in forward bending and extension secondary to back discomfort.    IMPRESSION Patient is on chronic anticoagulation with left ventricular assist device.  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  Completed Orders (this encounter) Order Details Reason Side Interpretation Result Initial Treatment Date Region  Lifestyle education regarding diet Encouraged to eat a well balanced diet and follow up with primary care physician.         Assessment/Plan # Detail Type Description   1. Assessment Lumbar compression fracture, closed, initial encounter (S32.000A).       2. Assessment Low back pain, unspecified back pain laterality, with sciatica presence unspecified (M54.5).       3. Assessment LVAD (left ventricular assist device) present (Z95.811).       4. Assessment Body mass index (BMI) 26.0-26.9, adult DA:[id number]).   Plan Orders Today's instructions / counseling include(s) Lifestyle education regarding diet.         Pain  Assessment/Treatment Pain Scale: 6/10. Method: Numeric Pain Intensity Scale. Location: back. Onset: 02/11/2015. Duration: varies. Quality: discomforting. Pain Assessment/Treatment follow-up plan of care: Patient is taking medications as prescribed..  Fall Risk Plan The Patient has fallen 2 times in the last year. The fall(s) resulted in injury. Details: fracture.  I recommended to patient he undergo posterior fusion L3 through L5 levels with implanted hardware and no interbody grafting.  I called Dr. Haroldine Laws, his cardiologist, to discuss the situation and my recommendations.  He felt that he could admit him and him off Coumadin and switch to heparin and that this would be very reasonable and that he would be a reasonable candidate for surgery.  He also explained that the LVAD coordinator, Jilda Roche, [phone], we'll be able to come to the operating room and assist with positioning to make sure we did not cause any problems with his ventricular assist device.  She would also be available to coordinate perioperative care and anticoagulation issues.  Orders: Diagnostic Procedures: Assessment Procedure  S32.000A Lumbar Fusion - L3 - L4 - L5 (no interbodies)  Instruction(s)/Education: Assessment Instruction  [phone] Lifestyle education regarding diet             Provider:  Marchia Meiers. Vertell Limber MD  10/02/2015 01:40 PM Dictation edited by: Marchia Meiers. Vertell Limber    CC Providers: Shaune Pascal Bensimhon [address] [place], [place] [address]-              Electronically signed by Marchia Meiers Vertell Limber MD on 10/02/2015 01:41 PM

## 2016-01-30 ENCOUNTER — Ambulatory Visit (HOSPITAL_COMMUNITY): Payer: Self-pay | Admitting: *Deleted

## 2016-01-30 LAB — POCT INR: INR: 1.7

## 2016-01-31 ENCOUNTER — Encounter: Payer: Self-pay | Admitting: Vascular Surgery

## 2016-01-31 ENCOUNTER — Ambulatory Visit (INDEPENDENT_AMBULATORY_CARE_PROVIDER_SITE_OTHER): Payer: Medicare Other | Admitting: Vascular Surgery

## 2016-01-31 VITALS — BP 98/70 | Temp 97.0°F | Resp 14 | Ht 72.0 in | Wt 192.0 lb

## 2016-01-31 DIAGNOSIS — I6521 Occlusion and stenosis of right carotid artery: Secondary | ICD-10-CM

## 2016-01-31 DIAGNOSIS — I6529 Occlusion and stenosis of unspecified carotid artery: Secondary | ICD-10-CM | POA: Insufficient documentation

## 2016-01-31 NOTE — Anesthesia Postprocedure Evaluation (Signed)
Anesthesia Post Note  Patient: [name]  Procedure(s) Performed: Procedure(s): CANCELLED PROCEDURE  Patient location during evaluation: PACU Anesthesia Type: MAC Level of consciousness: awake and alert Pain management: pain level controlled Vital Signs Assessment: post-procedure vital signs reviewed and stable Respiratory status: spontaneous breathing, nonlabored ventilation, respiratory function stable and patient connected to nasal cannula oxygen Cardiovascular status: blood pressure returned to baseline and stable Postop Assessment: no signs of nausea or vomiting Anesthetic complications: no    Last Vitals:  Filed Vitals:   01/20/16 1000 01/20/16 1050  BP:    Pulse: 64 68  Temp:  36.8 C  Resp: 16     Last Pain:  Filed Vitals:   01/20/16 1111  PainSc: 0-No pain                 Rosenda Geffrard,Amaru S

## 2016-01-31 NOTE — Progress Notes (Signed)
Subjective:     Patient ID: [name], male   DOB: Dec 20, 1940, 75 y.o.   MRN: [id number]  HPI This 75 year old male returns 2 weeks post right carotid endarterectomy for severe right carotid stenosis and a history of a right brain TIA with transient weakness and numbness in the left upper extremity. Patient has an LVAD in place. He also needs lumbar spine surgery and is scheduled to have this on Friday of this week by Dr. Vertell Limber. Patient denies any difficulty with swallowing  , hoarseness, lateralizing weakness, aphasia, amaurosis fugax, diplopia, blurred vision, or syncope. He has stopped his Coumadin in anticipation for the surgery on Friday.   Review of Systems     Objective:   Physical Exam BP 98/70 mmHg  Temp(Src) 97 F (36.1 C)  Resp 14  Ht 6' (1.829 m)  Wt 192 lb (87.091 kg)  BMI 26.03 kg/m2  SpO2 98%   Gen. Well-developed well-nourished male no apparent distress alert and oriented 3 Right neck incision is healed nicely. Suture from drain site was removed today. No evidence of infection. Neurologic exam normal     Assessment:      doing well 2 weeks post right carotid endarterectomy for severe right ICA stenosis  Which was symptomatic -right brain TIA    Plan:      patient undergo lumbar spine surgery this Friday by Dr. Vertell Limber Patient return in 6 months with carotid duplex exam  We'll also get duplex scan of bilateral lower extremity femoral-popliteal bypass grafts and see Dr. Servando Snare  In follow-up

## 2016-02-01 ENCOUNTER — Encounter (HOSPITAL_COMMUNITY): Payer: Self-pay | Admitting: General Practice

## 2016-02-01 ENCOUNTER — Inpatient Hospital Stay (HOSPITAL_COMMUNITY)
Admission: RE | Admit: 2016-02-01 | Discharge: 2016-02-10 | DRG: 457 | Disposition: A | Discharge status: Home / Self Care | Payer: Medicare Other | Source: Ambulatory Visit | Attending: Internal Medicine | Admitting: Internal Medicine

## 2016-02-01 DIAGNOSIS — I5032 Chronic diastolic (congestive) heart failure: Secondary | ICD-10-CM | POA: Insufficient documentation

## 2016-02-01 DIAGNOSIS — Z960 Presence of urogenital implants: Secondary | ICD-10-CM | POA: Diagnosis present

## 2016-02-01 DIAGNOSIS — I2510 Atherosclerotic heart disease of native coronary artery without angina pectoris: Secondary | ICD-10-CM | POA: Diagnosis present

## 2016-02-01 DIAGNOSIS — Z95811 Presence of heart assist device: Secondary | ICD-10-CM

## 2016-02-01 DIAGNOSIS — I739 Peripheral vascular disease, unspecified: Secondary | ICD-10-CM | POA: Diagnosis present

## 2016-02-01 DIAGNOSIS — I130 Hypertensive heart and chronic kidney disease with heart failure and stage 1 through stage 4 chronic kidney disease, or unspecified chronic kidney disease: Secondary | ICD-10-CM | POA: Diagnosis present

## 2016-02-01 DIAGNOSIS — Z8673 Personal history of transient ischemic attack (TIA), and cerebral infarction without residual deficits: Secondary | ICD-10-CM

## 2016-02-01 DIAGNOSIS — M879 Osteonecrosis, unspecified: Secondary | ICD-10-CM | POA: Diagnosis present

## 2016-02-01 DIAGNOSIS — E039 Hypothyroidism, unspecified: Secondary | ICD-10-CM

## 2016-02-01 DIAGNOSIS — D62 Acute posthemorrhagic anemia: Secondary | ICD-10-CM | POA: Diagnosis not present

## 2016-02-01 DIAGNOSIS — I252 Old myocardial infarction: Secondary | ICD-10-CM

## 2016-02-01 DIAGNOSIS — R112 Nausea with vomiting, unspecified: Secondary | ICD-10-CM | POA: Diagnosis not present

## 2016-02-01 DIAGNOSIS — G4730 Sleep apnea, unspecified: Secondary | ICD-10-CM | POA: Diagnosis present

## 2016-02-01 DIAGNOSIS — E7800 Pure hypercholesterolemia, unspecified: Secondary | ICD-10-CM | POA: Diagnosis present

## 2016-02-01 DIAGNOSIS — M545 Low back pain: Secondary | ICD-10-CM | POA: Diagnosis present

## 2016-02-01 DIAGNOSIS — Z85820 Personal history of malignant melanoma of skin: Secondary | ICD-10-CM

## 2016-02-01 DIAGNOSIS — Z7901 Long term (current) use of anticoagulants: Secondary | ICD-10-CM

## 2016-02-01 DIAGNOSIS — I480 Paroxysmal atrial fibrillation: Secondary | ICD-10-CM

## 2016-02-01 DIAGNOSIS — Z885 Allergy status to narcotic agent status: Secondary | ICD-10-CM

## 2016-02-01 DIAGNOSIS — M1990 Unspecified osteoarthritis, unspecified site: Secondary | ICD-10-CM | POA: Diagnosis present

## 2016-02-01 DIAGNOSIS — Z87891 Personal history of nicotine dependence: Secondary | ICD-10-CM

## 2016-02-01 DIAGNOSIS — Z419 Encounter for procedure for purposes other than remedying health state, unspecified: Secondary | ICD-10-CM

## 2016-02-01 DIAGNOSIS — R791 Abnormal coagulation profile: Secondary | ICD-10-CM | POA: Diagnosis not present

## 2016-02-01 DIAGNOSIS — M4856XA Collapsed vertebra, not elsewhere classified, lumbar region, initial encounter for fracture: Secondary | ICD-10-CM | POA: Diagnosis present

## 2016-02-01 DIAGNOSIS — I428 Other cardiomyopathies: Secondary | ICD-10-CM | POA: Diagnosis present

## 2016-02-01 DIAGNOSIS — D649 Anemia, unspecified: Secondary | ICD-10-CM | POA: Diagnosis not present

## 2016-02-01 DIAGNOSIS — R110 Nausea: Secondary | ICD-10-CM | POA: Diagnosis not present

## 2016-02-01 DIAGNOSIS — M40209 Unspecified kyphosis, site unspecified: Secondary | ICD-10-CM | POA: Diagnosis present

## 2016-02-01 DIAGNOSIS — L7632 Postprocedural hematoma of skin and subcutaneous tissue following other procedure: Secondary | ICD-10-CM | POA: Diagnosis not present

## 2016-02-01 DIAGNOSIS — Y839 Surgical procedure, unspecified as the cause of abnormal reaction of the patient, or of later complication, without mention of misadventure at the time of the procedure: Secondary | ICD-10-CM | POA: Diagnosis not present

## 2016-02-01 DIAGNOSIS — M4850XA Collapsed vertebra, not elsewhere classified, site unspecified, initial encounter for fracture: Secondary | ICD-10-CM

## 2016-02-01 DIAGNOSIS — I5022 Chronic systolic (congestive) heart failure: Secondary | ICD-10-CM

## 2016-02-01 DIAGNOSIS — R5082 Postprocedural fever: Secondary | ICD-10-CM

## 2016-02-01 DIAGNOSIS — Z95810 Presence of automatic (implantable) cardiac defibrillator: Secondary | ICD-10-CM

## 2016-02-01 DIAGNOSIS — M4850XD Collapsed vertebra, not elsewhere classified, site unspecified, subsequent encounter for fracture with routine healing: Secondary | ICD-10-CM | POA: Diagnosis not present

## 2016-02-01 DIAGNOSIS — I255 Ischemic cardiomyopathy: Secondary | ICD-10-CM | POA: Diagnosis present

## 2016-02-01 DIAGNOSIS — N183 Chronic kidney disease, stage 3 (moderate): Secondary | ICD-10-CM | POA: Diagnosis present

## 2016-02-01 DIAGNOSIS — Z955 Presence of coronary angioplasty implant and graft: Secondary | ICD-10-CM | POA: Diagnosis not present

## 2016-02-01 DIAGNOSIS — K5900 Constipation, unspecified: Secondary | ICD-10-CM | POA: Diagnosis not present

## 2016-02-01 LAB — COMPREHENSIVE METABOLIC PANEL
ALK PHOS: 123 U/L (ref 38–126)
ALT: 19 U/L (ref 17–63)
ANION GAP: 9 (ref 5–15)
AST: 30 U/L (ref 15–41)
Albumin: 3.3 g/dL — ABNORMAL LOW (ref 3.5–5.0)
BILIRUBIN TOTAL: 0.6 mg/dL (ref 0.3–1.2)
BUN: 22 mg/dL — ABNORMAL HIGH (ref 6–20)
CALCIUM: 8.8 mg/dL — AB (ref 8.9–10.3)
CO2: 22 mmol/L (ref 22–32)
Chloride: 105 mmol/L (ref 101–111)
Creatinine, Ser: 1.47 mg/dL — ABNORMAL HIGH (ref 0.61–1.24)
GFR calc non Af Amer: 45 mL/min — ABNORMAL LOW (ref >60)
GFR, EST AFRICAN AMERICAN: 52 mL/min — AB (ref >60)
Glucose, Bld: 121 mg/dL — ABNORMAL HIGH (ref 65–99)
Potassium: 4.2 mmol/L (ref 3.5–5.1)
Sodium: 136 mmol/L (ref 135–145)
TOTAL PROTEIN: 6.4 g/dL — AB (ref 6.5–8.1)

## 2016-02-01 LAB — PROTIME-INR
INR: 1.77 — ABNORMAL HIGH (ref 0.00–1.49)
PROTHROMBIN TIME: 20.6 s — AB (ref 11.6–15.2)

## 2016-02-01 LAB — LACTATE DEHYDROGENASE: LDH: 229 U/L — ABNORMAL HIGH (ref 98–192)

## 2016-02-01 MED ORDER — LEVOTHYROXINE SODIUM 100 MCG PO TABS: 200.0000 ug | ORAL_TABLET | Freq: Every day | ORAL | Status: DC

## 2016-02-01 MED ORDER — AMLODIPINE BESYLATE 10 MG PO TABS
10.0000 mg | ORAL_TABLET | Freq: Every day | ORAL | Status: DC
  Administered 2016-02-01 – 2016-02-09 (×9): 10 mg via ORAL
  Filled 2016-02-01 (×9): qty 1

## 2016-02-01 MED ORDER — ZINC GLUCONATE 50 MG PO TABS: 50.0000 mg | ORAL_TABLET | Freq: Two times a day (BID) | ORAL | Status: DC

## 2016-02-01 MED ORDER — PROMETHAZINE HCL 25 MG PO TABS: 12.5000 mg | ORAL_TABLET | Freq: Four times a day (QID) | ORAL | Status: DC | PRN

## 2016-02-01 MED ORDER — NITROGLYCERIN 0.4 MG SL SUBL: 0.4000 mg | SUBLINGUAL_TABLET | SUBLINGUAL | Status: DC | PRN

## 2016-02-01 MED ORDER — PANTOPRAZOLE SODIUM 40 MG PO TBEC
40.0000 mg | DELAYED_RELEASE_TABLET | Freq: Two times a day (BID) | ORAL | Status: DC
  Administered 2016-02-01 – 2016-02-10 (×17): 40 mg via ORAL
  Filled 2016-02-01 (×17): qty 1

## 2016-02-01 MED ORDER — RANOLAZINE ER 500 MG PO TB12
500.0000 mg | ORAL_TABLET | Freq: Two times a day (BID) | ORAL | Status: DC
  Administered 2016-02-01 – 2016-02-10 (×17): 500 mg via ORAL
  Filled 2016-02-01 (×17): qty 1

## 2016-02-01 MED ORDER — HEPARIN (PORCINE) IN NACL 100-0.45 UNIT/ML-% IJ SOLN
1200.0000 [IU]/h | INTRAMUSCULAR | Status: DC
  Administered 2016-02-01: 1200 [IU]/h via INTRAVENOUS
  Filled 2016-02-01 (×2): qty 250

## 2016-02-01 MED ORDER — AMIODARONE HCL 200 MG PO TABS
200.0000 mg | ORAL_TABLET | Freq: Every day | ORAL | Status: DC
Start: 2016-02-01 — End: 2016-02-10
  Administered 2016-02-02 – 2016-02-10 (×8): 200 mg via ORAL
  Filled 2016-02-01 (×8): qty 1

## 2016-02-01 MED ORDER — FUROSEMIDE 20 MG PO TABS: 20.0000 mg | ORAL_TABLET | ORAL | Status: DC | PRN

## 2016-02-01 MED ORDER — DOCUSATE SODIUM 100 MG PO CAPS
100.0000 mg | ORAL_CAPSULE | Freq: Every day | ORAL | Status: DC | PRN
  Administered 2016-02-07: 100 mg via ORAL

## 2016-02-01 MED ORDER — GUAIFENESIN ER 600 MG PO TB12
600.0000 mg | ORAL_TABLET | Freq: Two times a day (BID) | ORAL | Status: DC
  Administered 2016-02-01 – 2016-02-10 (×17): 600 mg via ORAL
  Filled 2016-02-01 (×17): qty 1

## 2016-02-01 MED ORDER — FERROUS GLUCONATE 324 (38 FE) MG PO TABS
324.0000 mg | ORAL_TABLET | Freq: Two times a day (BID) | ORAL | Status: DC
  Administered 2016-02-01 – 2016-02-06 (×10): 324 mg via ORAL
  Filled 2016-02-01 (×11): qty 1

## 2016-02-01 MED ORDER — ATORVASTATIN CALCIUM 80 MG PO TABS
80.0000 mg | ORAL_TABLET | Freq: Every day | ORAL | Status: DC
  Administered 2016-02-02 – 2016-02-10 (×8): 80 mg via ORAL
  Filled 2016-02-01 (×8): qty 1

## 2016-02-01 MED ORDER — METOPROLOL SUCCINATE ER 25 MG PO TB24
25.0000 mg | ORAL_TABLET | Freq: Two times a day (BID) | ORAL | Status: DC
  Administered 2016-02-01 – 2016-02-10 (×17): 25 mg via ORAL
  Filled 2016-02-01 (×18): qty 1

## 2016-02-01 NOTE — Progress Notes (Addendum)
ANTICOAGULATION CONSULT NOTE - Initial Consult  Pharmacy Consult for Heparin Indication: LVAD, Coumadin on hold for back surgery Friday  Allergies  Allergen Reactions  . Demerol [Meperidine] Other (See Comments)    Paralysis. Could only move eyes.    Patient Measurements:   Heparin Dosing Weight: 87 kg  Vital Signs:    Labs:  Recent Labs  01/30/16  INR 1.7    Estimated Creatinine Clearance: 41.8 mL/min (by C-G formula based on Cr of 1.65).   Medical History: Past Medical History  Diagnosis Date  . Bradycardia   . Ventricular tachycardia ([place])     a. s/p AICD;   h/o ICD shock;  Amiodarone Rx. S/P ablation in 2012  . PVD (peripheral vascular disease) ([place])     a. 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 HTN (hypertension)   . HLD (hyperlipidemia)   . Cytopenia   . Hypothyroidism   . CKD (chronic kidney disease)   . Chronic systolic heart failure ([place])     a. s/p LVAD 10/2013 for destination therapy  . Ischemic cardiomyopathy   . CVD (cerebrovascular disease)     a. s/p L CEA 2008  . Stroke [facility])     a. 10/2015 admission   . Inappropriate therapy from implantable cardioverter-defibrillator     a. ATP for sinus tach SVT wavelet identified SVT but was no  passive (2014)  . Automatic implantable cardioverter-defibrillator in situ     a. s/p Medtronic Evera device serial [id number]    . 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 L Ear   . PAF (paroxysmal atrial fibrillation) ([place])   . CAD (coronary artery disease)     a. s/p MI 1982;  s/p DES to CFX 2011;  s/p NSTEMI 4/12 in setting of VTach;  cath 7/12:  LM ok, LAD mid 30-40%, Dx's with 40%, mCFX stent patent, prox to mid RCA occluded with R to R and L to R collats.  Medical Tx was continued  . History of GI bleed     a. on ASA- started on plavix during 10/2015 admission for CVA  . Myocardial infarction ([place])     1992  . Dysrhythmia      AFIB  . Sleep apnea     NO CPAP NOW  HE SENT IT BACK YRS AGO  . Shortness of breath dyspnea     W/ EXERTION   . Arthritis     Assessment: 75 yo male with LVAD, chronic Coumadin on hold for planned back surgery this Friday.  Pharmacy asked to bridge with IV heparin.  Last outpatient INR = 1.7 on 3/27.  PTA warfarin dose 2 mg daily except 4 mg on Mondays.  Last home dose of 4 mg taken 3/27.  Goal INR 2-2.5.  Today's INR pending.  Goal of Therapy:  Heparin level 0.3-0.7 units/ml Monitor platelets by anticoagulation protocol: Yes   Plan:  1. F/u baseline INR. 2. Will start IV heparin when INR < 2. 3. Daily heparin level and CBC.  [place] Crane, Vena Austria, BCPS  Clinical Pharmacist Pager [phone]  02/01/2016 3:21 PM     =========================   Addendum: - INR is sub-therapeutic at 1.77 - no bleeding reported, LFTs WNL   Plan: - Heparin gtt at 1200 units/hr, no bolus - Check 8 hr HL - Daily HL /  CBC   Vaishnav Demartin D. Mina Marble, PharmD, BCPS Pager:  [phone] 02/01/2016, 6:11 PM

## 2016-02-01 NOTE — H&P (Signed)
VAD TEAM History & Physical Note   Reason for Admission: Lumbar Compression Fractures/ L3-L5 fusion 02/03/16.  PCP: Dr Kandice Robinsons Primary Cardiologist: Dr. Haroldine Laws   HPI:    [name] is a 75 yo male with h/o VT, PAD and severe systolic HF (EF 0000000) due to mixed ischemic/nonischemic CM he is s/p HM II LVAD implant for destination therapy on October 19, 2013.   Recently had admission for R CEA. Had persistent Aflutter on admission with RVR in 120-150s and required DCCV that admission. R CEA completed without complication. DCCV 01/19/16 with promt return to NSR.   He presents today as pre-admit and optimization for L3 to L5 fusion on 02/03/16 with Dr Erline Levine.  Coumadin will be held and he will need heparin bridging to surgery.  Diuretics have been held leading up to this admission.  He is stable from a HF perspective.   LVAD INTERROGATION:  HeartMate II LVAD:  Flow 4.1 liters/min, speed 9000, power 4.7, PI 7.3.    Review of Systems: [y] = yes, [ ]  = no   General: Weight gain [ ] ; Weight loss [ ] ; Anorexia [ ] ; Fatigue [ ] ; Fever [ ] ; Chills [ ] ; Weakness [ ]   Cardiac: Chest pain/pressure [ ] ; Resting SOB [ ] ; Exertional SOB [ ] ; Orthopnea [ ] ; Pedal Edema [ ] ; Palpitations [ ] ; Syncope [ ] ; Presyncope [ ] ; Paroxysmal nocturnal dyspnea[ ]   Pulmonary: Cough [y]; Wheezing[ ] ; Hemoptysis[ ] ; Sputum [ ] ; Snoring [ ]   GI: Vomiting[ ] ; Dysphagia[ ] ; Melena[ ] ; Hematochezia [ ] ; Heartburn[ ] ; Abdominal pain [ ] ; Constipation [ ] ; Diarrhea [ ] ; BRBPR [ ]   GU: Hematuria[ ] ; Dysuria [ ] ; Nocturia[ ]   Vascular: Pain in legs with walking [ ] ; Pain in feet with lying flat [ ] ; Non-healing sores [ ] ; Stroke [ ] ; TIA [ ] ; Slurred speech [ ] ;  Neuro: Headaches[ ] ; Vertigo[ ] ; Seizures[ ] ; Paresthesias[ ] ;Blurred vision [ ] ; Diplopia [ ] ; Vision changes [ ]   Ortho/Skin: Arthritis [y]; Joint pain [y]; Muscle pain [ ] ; Joint swelling [ ] ; Back Pain [ ] ; Rash [ ]   Psych: Depression[ ] ;  Anxiety[ ]   Heme: Bleeding problems [ ] ; Clotting disorders [ ] ; Anemia [ ]   Endocrine: Diabetes [ ] ; Thyroid dysfunction[ ]    Home Medications Prior to Admission medications   Medication Sig Start Date End Date Taking? Authorizing Provider  amiodarone (PACERONE) 200 MG tablet Take 1 tablet (200 mg total) by mouth daily. 01/20/16   Shirley Friar, PA-C  amLODipine (NORVASC) 10 MG tablet Take 1 tablet (10 mg total) by mouth at bedtime. 11/07/15   Eileen Stanford, PA-C  atorvastatin (LIPITOR) 80 MG tablet Take 80 mg by mouth daily.     Historical Provider, MD  docusate sodium (COLACE) 100 MG capsule Take 100 mg by mouth daily as needed for mild constipation.     Historical Provider, MD  ferrous gluconate (FERGON) 324 MG tablet Take 1 tablet (324 mg total) by mouth 2 (two) times daily with a meal. 06/23/15   Amy D Clegg, NP  furosemide (LASIX) 20 MG tablet Take 1 tablet (20 mg total) by mouth as needed for edema (weight gain). 11/14/15   Jolaine Artist, MD  guaiFENesin (MUCINEX) 600 MG 12 hr tablet Take 1 tablet (600 mg total) by mouth 2 (two) times daily. 01/20/16   Shirley Friar, PA-C  levothyroxine (SYNTHROID, LEVOTHROID) 200 MCG tablet Take  one 200 mcg daily before breakfast every day with 1 1/2 (300 mcg) every Monday. Patient taking differently: Take 200-300 mcg by mouth daily before breakfast. Take 1 tablet (269mcg) all days except on Mondays take 1&1/2 tablets (372mcg) 10/26/15   Jolaine Artist, MD  metoprolol succinate (TOPROL-XL) 25 MG 24 hr tablet Take 1 tablet (25 mg total) by mouth 2 (two) times daily. 12/29/15   Jolaine Artist, MD  Multiple Vitamins-Minerals (ICAPS PO) Take 1 tablet by mouth 2 (two) times daily.    Historical Provider, MD  nitroGLYCERIN (NITROSTAT) 0.4 MG SL tablet Place 0.4 mg under the tongue every 5 (five) minutes as needed for chest pain.    Historical Provider, MD  pantoprazole (PROTONIX) 40 MG tablet TAKE 1 TABLET EVERY 12 HOURS. 12/19/15    Jolaine Artist, MD  promethazine (PHENERGAN) 12.5 MG tablet Take 12.5 mg by mouth every 6 (six) hours as needed for nausea or vomiting.    Historical Provider, MD  ranolazine (RANEXA) 500 MG 12 hr tablet TAKE 1 TABLET (500 MG TOTAL) BY MOUTH 2 (TWO) TIMES DAILY. 01/20/16   Shirley Friar, PA-C  warfarin (COUMADIN) 2 MG tablet Take 2-4 mg by mouth daily. Reported on 01/31/2016    Historical Provider, MD  zinc gluconate 50 MG tablet Take 50 mg by mouth 2 (two) times daily.     Historical Provider, MD    Past Medical History: Past Medical History  Diagnosis Date  . Bradycardia   . Ventricular tachycardia ([place])     a. s/p AICD;   h/o ICD shock;  Amiodarone Rx. S/P ablation in 2012  . PVD (peripheral vascular disease) ([place])     a. 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 HTN (hypertension)   . HLD (hyperlipidemia)   . Cytopenia   . Hypothyroidism   . CKD (chronic kidney disease)   . Chronic systolic heart failure ([place])     a. s/p LVAD 10/2013 for destination therapy  . Ischemic cardiomyopathy   . CVD (cerebrovascular disease)     a. s/p L CEA 2008  . Stroke [facility])     a. 10/2015 admission   . Inappropriate therapy from implantable cardioverter-defibrillator     a. ATP for sinus tach SVT wavelet identified SVT but was no  passive (2014)  . Automatic implantable cardioverter-defibrillator in situ     a. s/p Medtronic Evera device serial [id number]    . 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 L Ear   . PAF (paroxysmal atrial fibrillation) ([place])   . CAD (coronary artery disease)     a. s/p MI 1982;  s/p DES to CFX 2011;  s/p NSTEMI 4/12 in setting of VTach;  cath 7/12:  LM ok, LAD mid 30-40%, Dx's with 40%, mCFX stent patent, prox to mid RCA occluded with R to R and L to R collats.  Medical Tx was continued  . History of GI bleed     a. on ASA- started on plavix during 10/2015 admission for CVA  .  Myocardial infarction ([place])     1992  . Dysrhythmia     AFIB  . Sleep apnea     NO CPAP NOW  HE SENT IT BACK YRS AGO  . Shortness of breath dyspnea     W/ EXERTION   . Arthritis  Past Surgical History: Past Surgical History  Procedure Laterality Date  . Femoral-popliteal bypass graft Right 2011  . Cardiac defibrillator placement  2003; 2005; 05/25/2013    2014: Medtronic Evera device serial number [id number]  . Revascularization / in-situ graft leg    . Renal artery bypass Bilateral 2003  . Back surgery    . Lumbar disc surgery  1974; 2000's  . Knee arthroscopy Bilateral 1990's  . Elbow arthroscopy Right 1990's  . Cholecystectomy  12/15/?2010  . Lipoma excision Left 2013    "near ear" (05/25/2013)  . Heel spur surgery Right 1990's  . Cataract extraction w/ intraocular lens  implant, bilateral Bilateral 2000  . Carotid endarterectomy Left ~ 2007  . Doppler echocardiography  2011  . Tonsillectomy  1946  . Femoral-popliteal bypass graft Left 05/2002    Archie Endo 05/06/2002 (05/25/2013)  . Tumor excision Left 1960's    "fatty tumor" (05/25/2013)  . Cardiac electrophysiology study and ablation  2012  . Coronary angioplasty  1992  . Cardiac catheterization      "several" (05/25/2013)  . Coronary angioplasty with stent placement      "last one was 11/2012  (05/25/2013)  . Cardioversion N/A 10/15/2013    Procedure: CARDIOVERSION;  Surgeon: Jolaine Artist, MD;  Location: [place];  Service: Cardiovascular;  Laterality: N/A;  . Insertion of implantable left ventricular assist device N/A 10/19/2013    Procedure: INSERTION OF IMPLANTABLE LEFT VENTRICULAR ASSIST DEVICE;  Surgeon: Gaye Pollack, MD;  Location: [place];  Service: Open Heart Surgery;  Laterality: N/A;  CIRC ARREST  NITRIC OXIDE  MEDTRONIC ICD  . Intraoperative transesophageal echocardiogram N/A 10/19/2013    Procedure: INTRAOPERATIVE TRANSESOPHAGEAL ECHOCARDIOGRAM;  Surgeon: Gaye Pollack, MD;  Location: [facility] OR;  Service:  Open Heart Surgery;  Laterality: N/A;  . Tee without cardioversion N/A 11/04/2013    Procedure: TRANSESOPHAGEAL ECHOCARDIOGRAM (TEE);  Surgeon: Larey Dresser, MD;  Location: [place];  Service: Cardiovascular;  Laterality: N/A;  . Cardioversion N/A 11/04/2013    Procedure: CARDIOVERSION;  Surgeon: Larey Dresser, MD;  Location: [place];  Service: Cardiovascular;  Laterality: N/A;  . Cardioversion N/A 06/04/2014    Procedure: CARDIOVERSION;  Surgeon: Jolaine Artist, MD;  Location: [facility] ENDOSCOPY;  Service: Cardiovascular;  Laterality: N/A;  . Cystoscopy with retrograde pyelogram, ureteroscopy and stent placement Left 08/09/2014    Procedure: [place], URETEROSCOPY AND STENT PLACEMENT;  Surgeon: Bernestine Amass, MD;  Location: [facility] ORS;  Service: Urology;  Laterality: Left;  . Cystoscopy with retrograde pyelogram, ureteroscopy and stent placement Left 09/01/2014    Procedure: CYSTOSCOPY WITH URETEROSCOPY AND STENT REMOVAL;  Surgeon: Bernestine Amass, MD;  Location: [facility] ORS;  Service: Urology;  Laterality: Left;  . Holmium laser application Left [id number]    Procedure: HOLMIUM LASER APPLICATION;  Surgeon: Bernestine Amass, MD;  Location: [facility] ORS;  Service: Urology;  Laterality: Left;  . Cystoscopy with retrograde pyelogram, ureteroscopy and stent placement Left 09/20/2014    Procedure: CYSTOSCOPY WITH RETROGRADE PYELOGRAM, URETEROSCOPY AND STENT PLACEMENT, stone removal;  Surgeon: Bernestine Amass, MD;  Location: [facility] ORS;  Service: Urology;  Laterality: Left;  Stephanie Coup ablation N/A 09/12/2011    Procedure: V-TACH ABLATION;  Surgeon: Evans Lance, MD;  Location: [facility] CATH LAB;  Service: Cardiovascular;  Laterality: N/A;  . Left heart catheterization with coronary angiogram N/A 11/24/2012    Procedure: LEFT HEART CATHETERIZATION WITH CORONARY ANGIOGRAM;  Surgeon: Peter M Martinique, MD;  Location: [facility] CATH  LAB;  Service: Cardiovascular;  Laterality: N/A;  . Implantable  cardioverter defibrillator generator change N/A 05/25/2013    Procedure: IMPLANTABLE CARDIOVERTER DEFIBRILLATOR GENERATOR CHANGE;  Surgeon: Deboraha Sprang, MD;  Location: [facility] CATH LAB;  Service: Cardiovascular;  Laterality: N/A;  . Lead revision N/A 06/05/2013    Procedure: LEAD REVISION;  Surgeon: Thompson Grayer, MD;  Location: [facility] CATH LAB;  Service: Cardiovascular;  Laterality: N/A;  . V-tach ablation N/A 06/08/2013    Procedure: V-TACH ABLATION;  Surgeon: Evans Lance, MD;  Location: [facility] CATH LAB;  Service: Cardiovascular;  Laterality: N/A;  . Right heart catheterization N/A 09/17/2013    Procedure: RIGHT HEART CATH;  Surgeon: Jolaine Artist, MD;  Location: [facility] CATH LAB;  Service: Cardiovascular;  Laterality: N/A;  . Right heart catheterization N/A 10/14/2013    Procedure: RIGHT HEART CATH;  Surgeon: Jolaine Artist, MD;  Location: [facility] CATH LAB;  Service: Cardiovascular;  Laterality: N/A;  . Right heart catheterization N/A 11/16/2013    Procedure: RIGHT HEART CATH;  Surgeon: Jolaine Artist, MD;  Location: [facility] CATH LAB;  Service: Cardiovascular;  Laterality: N/A;  . Right heart catheterization N/A 07/13/2014    Procedure: RIGHT HEART CATH;  Surgeon: Jolaine Artist, MD;  Location: [facility] CATH LAB;  Service: Cardiovascular;  Laterality: N/A;  . Esophagogastroduodenoscopy N/A 02/15/2015    Procedure: ESOPHAGOGASTRODUODENOSCOPY (EGD);  Surgeon: Carol Ada, MD;  Location: [facility] ENDOSCOPY;  Service: Endoscopy;  Laterality: N/A;  LVAD  . Tee without cardioversion N/A 03/02/2015    Procedure: TRANSESOPHAGEAL ECHOCARDIOGRAM (TEE);  Surgeon: Jolaine Artist, MD;  Location: [place];  Service: Cardiovascular;  Laterality: N/A;  . Cardioversion N/A 03/02/2015    Procedure: CARDIOVERSION;  Surgeon: Jolaine Artist, MD;  Location: [facility] ENDOSCOPY;  Service: Cardiovascular;  Laterality: N/A;  . Eye surgery      VITRECTOMY  LEFT 05/2012  . Endarterectomy Right 01/13/2016    Procedure: RIGHT CAROTID ARTERY  ENDARTERECTOMY;  Surgeon: Mal Misty, MD;  Location: [place];  Service: Vascular;  Laterality: Right;  . Patch angioplasty Right 01/13/2016    Procedure: WITH  PATCH ANGIOPLASTY;  Surgeon: Mal Misty, MD;  Location: [place];  Service: Vascular;  Laterality: Right;  . Tee without cardioversion N/A 01/19/2016    Procedure: TRANSESOPHAGEAL ECHOCARDIOGRAM (TEE);  Surgeon: Larey Dresser, MD;  Location: [place];  Service: Cardiovascular;  Laterality: N/A;  . Cardioversion N/A 01/19/2016    Procedure: CARDIOVERSION;  Surgeon: Larey Dresser, MD;  Location: [facility] ENDOSCOPY;  Service: Cardiovascular;  Laterality: N/A;    Family History: Family History  Problem Relation Age of Onset  . Coronary artery disease    . Heart attack Mother   . Coronary artery disease Mother   . Cancer Mother   . Heart disease Mother   . Hyperlipidemia Mother   . Hypertension Mother   . Coronary artery disease Father   . Stroke Father   . Cancer Father   . Heart disease Father     before age 36  . Hyperlipidemia Father   . Hypertension Father   . Heart attack Father     Social History: Social History   Social History  . Marital Status: Divorced    Spouse Name: N/A  . Number of Children: 2  . Years of Education: N/A   Occupational History  . retired     [employer]  . 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 Smoking status: Former Smoker -- 0.50 packs/day for 37 years    Types: Cigarettes    Quit date: 11/05/2001  . Smokeless tobacco: Never Used  . Alcohol Use: 0.0 oz/week    0 Standard drinks or equivalent per week     Comment: 05/25/2013 "mixed drink couple times/month"  . Drug Use: No  . Sexual Activity: No   Other Topics Concern  . None   Social History Narrative   Lives with sig other.    Allergies:  Allergies  Allergen Reactions  . Demerol [Meperidine] Other (See Comments)    Paralysis. Could only move  eyes.    Objective:    Vital Signs:   Temp:  [98.1 F (36.7 C)] 98.1 F (36.7 C) (03/29 1600) Pulse Rate:  [60] 60 (03/29 1600) Resp:  [16] 16 (03/29 1600) BP: (102)/(76) 102/76 mmHg (03/29 1600) SpO2:  [99 %] 99 % (03/29 1600)     Mean arterial Pressure 60-70s  Physical Exam: General:  Elderly appearing. NAD HEENT: normal Neck: supple. JVP 7-8 cm. R CEA site C/D/I Carotids 2+ bilat; no bruits. No lymphadenopathy or thryomegaly appreciated. Cor: Mechanical heart sounds with LVAD hum present. Lungs: CTAB, normal effort Abdomen: soft, nontender, nondistended. No hepatosplenomegaly. No bruits or masses. Good bowel sounds. Driveline: C/D/I; securement device intact and driveline incorporated Extremities: no cyanosis, clubbing, rash, edema Neuro: alert & orientedx3, cranial nerves grossly intact. moves all 4 extremities w/o difficulty. Affect pleasant  Telemetry: A pacing   Labs: Basic Metabolic Panel:  Recent Labs Lab 01/26/16 1109 02/01/16 1646  NA 139 136  K 4.6 4.2  CL 106 105  CO2 25 22  GLUCOSE 97 121*  BUN 18 22*  CREATININE 1.65* 1.47*  CALCIUM 9.0 8.8*    Liver Function Tests:  Recent Labs Lab 02/01/16 1646  AST 30  ALT 19  ALKPHOS 123  BILITOT 0.6  PROT 6.4*  ALBUMIN 3.3*   No results for input(s): LIPASE, AMYLASE in the last 168 hours. No results for input(s): AMMONIA in the last 168 hours.  CBC:  Recent Labs Lab 01/26/16 1109  WBC 8.0  HGB 10.5*  HCT 33.7*  MCV 91.8  PLT 213    Cardiac Enzymes: No results for input(s): CKTOTAL, CKMB, CKMBINDEX, TROPONINI in the last 168 hours.  BNP: BNP (last 3 results)  Recent Labs  09/21/15 0021 09/22/15 1330 10/07/15 1125  BNP 638.3* 707.6* 491.6*    ProBNP (last 3 results) No results for input(s): PROBNP in the last 8760 hours.   CBG: No results for input(s): GLUCAP in the last 168 hours.  Coagulation Studies:  Recent Labs  01/30/16 02/01/16 1646  LABPROT  --  20.6*  INR  1.7 1.77*    Other results:  Imaging: No results found.      Assessment:   1. Debilitating back pain due to lumbar compression fracture - Major limiting factor of his functional status 2. 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 PAF/AFL with RVR s/p TEEdccv on 01/19/16 4. CKD stage III 5. 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 Now on Plavix and warfarin at home.  6. High-grade R internal carotid stenosis - s/p R CEA 01/13/16 - Had f/u 3/28. Stable.   Plan/Discussion:    Coumadin on hold. Will need heparin bridging with INR 1.7 this evening.    He is scheduled for L3 to L5 fusion on 02/03/16.  We will optimize his HF and VAD parameters including INR leading up to this and continue to follow.   I reviewed the LVAD parameters from today, and compared the results to the patient's prior recorded data.  No programming changes were made.  The LVAD is functioning within specified parameters.  The patient performs LVAD self-test daily.  LVAD interrogation was negative for any significant power changes, alarms or PI events/speed drops.  LVAD equipment check completed and is in good working order.  Back-up equipment present.   LVAD education done on emergency procedures and precautions and reviewed exit site care.  Length of Stay: 0  Shirley Friar PA-C 02/01/2016, 7:16 PM  VAD Team Pager [phone] (7am - 7am) +++VAD ISSUES ONLY+++   Advanced Heart Failure Team Pager [phone] (M-F; [place])  Please contact [facility] for night-coverage after hours (4p -7a ) and weekends on [email or web address] for all non- LVAD Issues  Patient seen and examined with Oda Kilts, PA-C. We discussed all aspects of the encounter. I agree with the assessment and plan as stated above.   He is stable. Remains in NSR on amio. Volume status looks great. VAD parameters stable. INR 1.7. Start heparin. Plan for back surgery on Friday. He is aware of serious risks.  Muriel Wilber,MD 12:25  AM

## 2016-02-02 ENCOUNTER — Encounter (HOSPITAL_COMMUNITY): Payer: Self-pay | Admitting: Critical Care Medicine

## 2016-02-02 LAB — CBC
HCT: 32.4 % — ABNORMAL LOW (ref 39.0–52.0)
Hemoglobin: 10.2 g/dL — ABNORMAL LOW (ref 13.0–17.0)
MCH: 28.4 pg (ref 26.0–34.0)
MCHC: 31.5 g/dL (ref 30.0–36.0)
MCV: 90.3 fL (ref 78.0–100.0)
PLATELETS: 191 10*3/uL (ref 150–400)
RBC: 3.59 MIL/uL — ABNORMAL LOW (ref 4.22–5.81)
RDW: 17.5 % — ABNORMAL HIGH (ref 11.5–15.5)
WBC: 6.9 10*3/uL (ref 4.0–10.5)

## 2016-02-02 LAB — HEPARIN LEVEL (UNFRACTIONATED)
HEPARIN UNFRACTIONATED: 0.62 [IU]/mL (ref 0.30–0.70)
Heparin Unfractionated: <0.1 IU/mL — ABNORMAL LOW (ref 0.30–0.70)

## 2016-02-02 LAB — PROTIME-INR
INR: 1.79 — ABNORMAL HIGH (ref 0.00–1.49)
INR: 1.8 — ABNORMAL HIGH (ref 0.00–1.49)
Prothrombin Time: 20.7 seconds — ABNORMAL HIGH (ref 11.6–15.2)
Prothrombin Time: 20.8 seconds — ABNORMAL HIGH (ref 11.6–15.2)

## 2016-02-02 LAB — LACTATE DEHYDROGENASE: LDH: 221 U/L — AB (ref 98–192)

## 2016-02-02 MED ORDER — EPINEPHRINE HCL 1 MG/ML IJ SOLN
0.5000 ug/min | INTRAVENOUS | Status: AC
  Filled 2016-02-02 (×2): qty 4

## 2016-02-02 MED ORDER — HEPARIN (PORCINE) IN NACL 100-0.45 UNIT/ML-% IJ SOLN: 1150.0000 [IU]/h | INTRAMUSCULAR | Status: DC

## 2016-02-02 MED ORDER — MILRINONE IN DEXTROSE 20 MG/100ML IV SOLN
0.1250 ug/kg/min | INTRAVENOUS | Status: AC
  Administered 2016-02-03: 0.375 ug/kg/min via INTRAVENOUS
  Filled 2016-02-02: qty 100

## 2016-02-02 MED ORDER — LEVOTHYROXINE SODIUM 100 MCG PO TABS
200.0000 ug | ORAL_TABLET | ORAL | Status: DC
  Administered 2016-02-02: 300 ug via ORAL
  Administered 2016-02-04 – 2016-02-05 (×2): 200 ug via ORAL
  Filled 2016-02-02 (×3): qty 2

## 2016-02-02 MED ORDER — NOREPINEPHRINE BITARTRATE 1 MG/ML IV SOLN
0.0000 ug/min | INTRAVENOUS | Status: AC
  Administered 2016-02-03: 4 ug/min via INTRAVENOUS
  Filled 2016-02-02 (×2): qty 4

## 2016-02-02 MED ORDER — SODIUM CHLORIDE 0.9 % IV SOLN: Freq: Once | INTRAVENOUS | Status: DC

## 2016-02-02 MED ORDER — LEVOTHYROXINE SODIUM 100 MCG PO TABS
300.0000 ug | ORAL_TABLET | ORAL | Status: DC
  Administered 2016-02-06: 300 ug via ORAL
  Filled 2016-02-02: qty 3

## 2016-02-02 NOTE — Progress Notes (Signed)
HeartMate 2 Rounding Note  Subjective:    Feeling fine. No complaints this am except for R hand mildly edematous and tender from IV infiltration.   INR 1.79. On heparin gtt. IV had infiltrated this am so HL undetectable. To recheck this afternoon.   LVAD INTERROGATION:  HeartMate II LVAD:  Flow 4.0 liters/min, speed 9000, power 4.6, PI 5.8, 6 PI events   Objective:    Vital Signs:   Temp:  [98.1 F (36.7 C)-98.2 F (36.8 C)] 98.2 F (36.8 C) (03/30 0533) Pulse Rate:  [60-61] 60 (03/30 0533) Resp:  [16] 16 (03/30 0533) BP: (85-102)/(69-76) 85/69 mmHg (03/30 0700) SpO2:  [95 %-99 %] 95 % (03/30 0533) Weight:  [185 lb 8 oz (84.142 kg)] 185 lb 8 oz (84.142 kg) (03/30 0533) Last BM Date:  (PTA) Mean arterial Pressure 70s  Intake/Output:   Intake/Output Summary (Last 24 hours) at 02/02/16 0850 Last data filed at 02/02/16 0505  Gross per 24 hour  Intake    240 ml  Output    600 ml  Net   -360 ml     Physical Exam: General: Elderly appearing. NAD HEENT: normal Neck: supple. JVP 7-8 cm. R CEA site C/D/I Carotids 2+ bilat; no bruits. No thyromegaly or nodule noted. Cor: Mechanical heart sounds with LVAD hum present. Lungs: CTAB, normal effort Abdomen: soft, NT, ND, no HSM. No bruits or masses. +BS Driveline: C/D/I; securement device intact and driveline incorporated Extremities: no cyanosis, clubbing, rash, edema Neuro: alert & orientedx3, cranial nerves grossly intact. moves all 4 extremities w/o difficulty. Affect pleasant  Telemetry: Reviewed, A pacing 60s  Labs: Basic Metabolic Panel:  Recent Labs Lab 01/26/16 1109 02/01/16 1646  NA 139 136  K 4.6 4.2  CL 106 105  CO2 25 22  GLUCOSE 97 121*  BUN 18 22*  CREATININE 1.65* 1.47*  CALCIUM 9.0 8.8*    Liver Function Tests:  Recent Labs Lab 02/01/16 1646  AST 30  ALT 19  ALKPHOS 123  BILITOT 0.6  PROT 6.4*  ALBUMIN 3.3*   No results for input(s): LIPASE, AMYLASE in the last 168 hours. No results for  input(s): AMMONIA in the last 168 hours.  CBC:  Recent Labs Lab 01/26/16 1109 02/02/16 0420  WBC 8.0 6.9  HGB 10.5* 10.2*  HCT 33.7* 32.4*  MCV 91.8 90.3  PLT 213 191    INR:  Recent Labs Lab 01/30/16 02/01/16 1646 02/02/16 0420  INR 1.7 1.77* 1.79*    Other results:  EKG:   Imaging:  No results found.   Medications:     Scheduled Medications: . amiodarone  200 mg Oral Daily  . amLODipine  10 mg Oral QHS  . atorvastatin  80 mg Oral Daily  . ferrous gluconate  324 mg Oral BID WC  . guaiFENesin  600 mg Oral BID  . [START ON 02/03/2016] levothyroxine  200-300 mcg Oral QAC breakfast  . metoprolol succinate  25 mg Oral BID  . pantoprazole  40 mg Oral BID  . ranolazine  500 mg Oral BID     Infusions: . heparin 1,200 Units/hr (02/02/16 0700)     PRN Medications:  docusate sodium, furosemide, nitroGLYCERIN, promethazine   Assessment:   1. Debilitating back pain due to lumbar compression fracture - Major limiting factor of his functional status 2. 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 PAF/AFL with RVR s/p TEEdccv on 01/19/16 4. CKD stage III 5. H/o GIB - s/p clipping of 2 jejunal  AVMs in 4/16. Now on Plavix and warfarin at home.  6. High-grade R internal carotid stenosis - s/p R CEA 01/13/16 - Had f/u 3/28. Stable.   Plan/Discussion:    HF stable. Continue heparin drip.  He is stable for surgery. He is aware of the serious risks and verbalizes understanding.  Will discuss with VAD coordinator and MD plans to see how patient tolerates prone positioning prior to surgery.   I reviewed the LVAD parameters from today, and compared the results to the patient's prior recorded data.  No programming changes were made.  The LVAD is functioning within specified parameters.  The patient performs LVAD self-test daily.  LVAD interrogation was negative for any significant power changes, alarms or PI events/speed drops.  LVAD equipment check completed and is  in good working order.  Back-up equipment present.   LVAD education done on emergency procedures and precautions and reviewed exit site care.  Length of Stay: 1  Shirley Friar 02/02/2016, 8:50 AM  VAD Team --- VAD ISSUES ONLY--- Pager [phone] (7am - 7am)  Advanced Heart Failure Team  Pager [phone] (M-F; 7a - 4p)  Please contact [facility] for night-coverage after hours (4p -7a ) and weekends on [email or web address]   Patient seen and examined with Oda Kilts, PA-C. We discussed all aspects of the encounter. I agree with the assessment and plan as stated above.   Doing well. Maintaining NSR. INR 1.7. Will recheck INR later today if > 1.6 will give 1u FFP. Coordinating surgery with anesthesia for tomorrow. VAD parameters are stable.   Suheyb Raucci,MD 12:03 PM

## 2016-02-02 NOTE — Progress Notes (Signed)
ANTICOAGULATION CONSULT NOTE - FOLLOW UP    HL = 0.62 (goal 0.3 - 0.7 units/mL) Heparin dosing weight = 87 kg   Assessment: 75 YOM with LVAD to continue on IV heparin while Coumadin is on hold for back surgery tomorrow.  Heparin level is therapeutic at steady-state.  No bleeding reported.   Plan: - Reduce heparin gtt slightly to 1150 units/hr - F/U AM labs    Glendon Fiser D. Mina Marble, PharmD, BCPS 02/02/2016, 4:44 PM

## 2016-02-02 NOTE — Progress Notes (Signed)
Anesthesia Chart Review: See my note from 12/29/15 with update from 01/02/16 and update from 01/11/16. Patient with history of ischemic CM, [employer] Evera ICD, LVAC 10/19/13 for destination therapy, PAF, PAD, CVA. Since my last note, he has undergone right CEA by Dr. Kellie Simmering on 01/13/16. He was back in afib prior to that surgery and ultimately required DCCV on 01/19/16. He was discharged home the following day. He is now scheduled for L3-5 fusion (no interbodies) with Dr. Vertell Limber tomorrow. He is currently admitted by Dr. Haroldine Laws for a heparin bridge while he is off warfarin.   Updated tests since my last note: - 01/26/16 ([facility]): "Dr. Aundra Dubin - pt is A paced. Optivol obtained and reviewed by Dr. Aundra Dubin - no afib noted since cardioversion 01/19/16."  - 01/19/16 TEE: Study Conclusions - Left ventricle: Moderately dilated LV with severe systolic dysfunction, EF 0000000. LVAD inflow cannula visualized and patent. No evidence of thrombus. - Aortic valve: The aortic valve does not open, no AI. - Aorta: The thoracic aorta was normal in caliber, grade III plaque in descending thoracic aorta. - Mitral valve: There was mild regurgitation. - Left atrium: The atrium was moderately dilated. There was smoke in the LA appendage but no thrombus. - Right ventricle: Moderate to severely dilated RV with moderate to severe systolic dysfunction. Pacer wire or catheter noted in right ventricle. - Right atrium: The atrium was moderately dilated. - Atrial septum: No defect or patent foramen ovale was identified. - Tricuspid valve: There was moderate regurgitation. Impressions: - Successful cardioversion. No cardiac source of emboli was indentified.  - 01/17/16 1V CXR: IMPRESSION: Increased small right pleural effusion. Stable cardiomegaly and mild diffuse interstitial edema pattern.  Currently, lasted labs show H/H 10.2/32.4. INR 1.79. Cr 1.47.   Cardiology note from today by Dr. Haroldine Laws indicates patient is stable for  surgery. If INR is > 1.6 later today then there are plans for 1 Unit FFP. Notes indicate LVAD coordinator aware of surgery plans as well. Will likely need to help with positioning tomorrow for surgery. Anesthesiologist to evaluate prior to surgery. Case is posted in room 11.   [name] Phone [phone] 02/02/2016 4:22 PM

## 2016-02-02 NOTE — Progress Notes (Signed)
Utilization review completed.  

## 2016-02-02 NOTE — Progress Notes (Signed)
ANTICOAGULATION CONSULT NOTE - Follow Up Consult  Pharmacy Consult for Heparin (warfarin on hold) Indication: LVAD  Allergies  Allergen Reactions  . Demerol [Meperidine] Other (See Comments)    Paralysis. Could only move eyes.    Vital Signs: Temp: 98.1 F (36.7 C) (03/29 2138) Temp Source: Oral (03/29 2138) Pulse Rate: 61 (03/29 2138)  Labs:  Recent Labs  02/01/16 1646 02/02/16 0420  HGB  --  10.2*  HCT  --  32.4*  PLT  --  191  LABPROT 20.6* 20.7*  INR 1.77* 1.79*  HEPARINUNFRC  --  <0.10*  CREATININE 1.47*  --     Estimated Creatinine Clearance: 46.9 mL/min (by C-G formula based on Cr of 1.47).  Assessment: Warfarin on hold for back surgery, HL is undetectable, RN discovered this AM that IV was infiltrated for unknown amount of time so will not increase heparin at this time  Goal of Therapy:  Heparin level 0.3-0.7 units/ml Monitor platelets by anticoagulation protocol: Yes   Plan:  -Cont heparin at 1200 units/hr when access is re-established  -F/U when heparin is re-started for timing of heparin level   Narda Bonds 02/02/2016,5:09 AM

## 2016-02-02 NOTE — Progress Notes (Signed)
Subjective: Patient reports "I'm doing good. I'm looking forward to getting in done"  Objective: Vital signs in last 24 hours: Temp:  [98.1 F (36.7 C)-98.2 F (36.8 C)] 98.2 F (36.8 C) (03/30 0533) Pulse Rate:  [60-61] 60 (03/30 0533) Resp:  [16] 16 (03/30 0533) BP: (85-102)/(69-76) 85/69 mmHg (03/30 0700) SpO2:  [95 %-99 %] 95 % (03/30 0533) Weight:  [84.142 kg (185 lb 8 oz)] 84.142 kg (185 lb 8 oz) (03/30 0533)  Intake/Output from previous day: 03/29 0701 - 03/30 0700 In: 240 [P.O.:240] Out: 600 [Urine:600] Intake/Output this shift:    Alert, conversant. Reports no pain at present, semi-fowlers position. Reports pain returns with standing and walking. Verbalizes understanding of operative plan and the significant risks and wishes to proceed.   Lab Results:  Recent Labs  02/02/16 0420  WBC 6.9  HGB 10.2*  HCT 32.4*  PLT 191   BMET  Recent Labs  02/01/16 1646  NA 136  K 4.2  CL 105  CO2 22  GLUCOSE 121*  BUN 22*  CREATININE 1.47*  CALCIUM 8.8*    Studies/Results: No results found.  Assessment/Plan:   LOS: 1 day  Plan to proceed with L3-4, L4-5 posterior decompression and fusion without interbodies on tomorrow.    Verdis Prime 02/02/2016, 9:38 AM

## 2016-02-02 NOTE — Care Management Note (Signed)
Case Management Note Marvetta Gibbons RN, BSN Unit 2W-Case Manager [phone]  Patient Details  Name: [name] MRN: [id number] Date of Birth: December 24, 1940  Subjective/Objective:    Pt admitted for L3 to L5 fusion on 02/03/16 with Dr Erline Levine. Coumadin will be held and he will need heparin bridging to surgery- hx LVAD                Action/Plan: PTA pt lived at home with wife- CM to follow for d/c needs post op  Expected Discharge Date:                  Expected Discharge Plan:  [place]  In-House Referral:     Discharge planning Services  CM Consult  Post Acute Care Choice:  Home Health Choice offered to:     DME Arranged:    DME Agency:     HH Arranged:    [place] Agency:     Status of Service:  In process, will continue to follow  Medicare Important Message Given:    Date Medicare IM Given:    Medicare IM give by:    Date Additional Medicare IM Given:    Additional Medicare Important Message give by:     If discussed at [place] of Stay Meetings, dates discussed:    Additional Comments:  Dawayne Patricia, RN 02/02/2016, 2:39 PM

## 2016-02-03 ENCOUNTER — Inpatient Hospital Stay (HOSPITAL_COMMUNITY): Payer: Medicare Other | Admitting: Anesthesiology

## 2016-02-03 ENCOUNTER — Inpatient Hospital Stay (HOSPITAL_COMMUNITY): Payer: Medicare Other | Admitting: Vascular Surgery

## 2016-02-03 ENCOUNTER — Inpatient Hospital Stay (HOSPITAL_COMMUNITY): Payer: Medicare Other

## 2016-02-03 ENCOUNTER — Encounter (HOSPITAL_COMMUNITY)
Admission: RE | Disposition: A | Discharge status: Home / Self Care | Payer: Self-pay | Source: Ambulatory Visit | Attending: Internal Medicine

## 2016-02-03 ENCOUNTER — Inpatient Hospital Stay (HOSPITAL_COMMUNITY): Admission: RE | Admit: 2016-02-03 | Payer: Medicare Other | Source: Ambulatory Visit | Admitting: Neurosurgery

## 2016-02-03 DIAGNOSIS — M4850XA Collapsed vertebra, not elsewhere classified, site unspecified, initial encounter for fracture: Secondary | ICD-10-CM | POA: Diagnosis present

## 2016-02-03 LAB — CBC WITH DIFFERENTIAL/PLATELET
BASOS PCT: 0 %
Basophils Absolute: 0 10*3/uL (ref 0.0–0.1)
EOS PCT: 0 %
Eosinophils Absolute: 0 10*3/uL (ref 0.0–0.7)
HEMATOCRIT: 29.9 % — AB (ref 39.0–52.0)
HEMOGLOBIN: 9.7 g/dL — AB (ref 13.0–17.0)
Lymphocytes Relative: 6 %
Lymphs Abs: 0.5 10*3/uL — ABNORMAL LOW (ref 0.7–4.0)
MCH: 29 pg (ref 26.0–34.0)
MCHC: 32.4 g/dL (ref 30.0–36.0)
MCV: 89.3 fL (ref 78.0–100.0)
MONO ABS: 0.5 10*3/uL (ref 0.1–1.0)
MONOS PCT: 6 %
NEUTROS PCT: 88 %
Neutro Abs: 7.2 10*3/uL (ref 1.7–7.7)
Platelets: 136 10*3/uL — ABNORMAL LOW (ref 150–400)
RBC: 3.35 MIL/uL — ABNORMAL LOW (ref 4.22–5.81)
RDW: 16.8 % — AB (ref 11.5–15.5)
WBC: 8.2 10*3/uL (ref 4.0–10.5)

## 2016-02-03 LAB — CBC
HCT: 31.8 % — ABNORMAL LOW (ref 39.0–52.0)
Hemoglobin: 10.1 g/dL — ABNORMAL LOW (ref 13.0–17.0)
MCH: 28.6 pg (ref 26.0–34.0)
MCHC: 31.8 g/dL (ref 30.0–36.0)
MCV: 90.1 fL (ref 78.0–100.0)
PLATELETS: 176 10*3/uL (ref 150–400)
RBC: 3.53 MIL/uL — AB (ref 4.22–5.81)
RDW: 17.6 % — ABNORMAL HIGH (ref 11.5–15.5)
WBC: 6.2 10*3/uL (ref 4.0–10.5)

## 2016-02-03 LAB — PREPARE FRESH FROZEN PLASMA
UNIT DIVISION: 0
UNIT DIVISION: 0
Unit division: 0

## 2016-02-03 LAB — POCT I-STAT 7, (LYTES, BLD GAS, ICA,H+H)
Acid-base deficit: 3 mmol/L — ABNORMAL HIGH (ref 0.0–2.0)
Bicarbonate: 24.5 mEq/L — ABNORMAL HIGH (ref 20.0–24.0)
Calcium, Ion: 1.18 mmol/L (ref 1.13–1.30)
HEMATOCRIT: 32 % — AB (ref 39.0–52.0)
Hemoglobin: 10.9 g/dL — ABNORMAL LOW (ref 13.0–17.0)
O2 Saturation: 100 %
PCO2 ART: 54 mmHg — AB (ref 35.0–45.0)
POTASSIUM: 4.1 mmol/L (ref 3.5–5.1)
Sodium: 138 mmol/L (ref 135–145)
TCO2: 26 mmol/L (ref 0–100)
pH, Arterial: 7.264 — ABNORMAL LOW (ref 7.350–7.450)
pO2, Arterial: 448 mmHg — ABNORMAL HIGH (ref 80.0–100.0)

## 2016-02-03 LAB — PROTIME-INR
INR: 1.63 — ABNORMAL HIGH (ref 0.00–1.49)
Prothrombin Time: 19.3 seconds — ABNORMAL HIGH (ref 11.6–15.2)

## 2016-02-03 LAB — PREPARE RBC (CROSSMATCH)

## 2016-02-03 LAB — LACTATE DEHYDROGENASE: LDH: 225 U/L — AB (ref 98–192)

## 2016-02-03 LAB — HEPARIN LEVEL (UNFRACTIONATED): Heparin Unfractionated: 0.54 IU/mL (ref 0.30–0.70)

## 2016-02-03 SURGERY — POSTERIOR LUMBAR FUSION 2 LEVEL
Anesthesia: General | Site: Back

## 2016-02-03 MED ORDER — ARTIFICIAL TEARS OP OINT
TOPICAL_OINTMENT | OPHTHALMIC | Status: AC
  Filled 2016-02-03: qty 3.5

## 2016-02-03 MED ORDER — PANTOPRAZOLE SODIUM 40 MG IV SOLR: 40.0000 mg | Freq: Every day | INTRAVENOUS | Status: DC

## 2016-02-03 MED ORDER — OXYCODONE HCL 5 MG PO TABS: 5.0000 mg | ORAL_TABLET | Freq: Once | ORAL | Status: DC | PRN

## 2016-02-03 MED ORDER — SODIUM CHLORIDE 0.9 % IV SOLN
500.0000 mg | INTRAVENOUS | Status: DC | PRN
  Administered 2016-02-03: 10 ug/kg/min via INTRAVENOUS

## 2016-02-03 MED ORDER — HYDROCODONE-ACETAMINOPHEN 5-325 MG PO TABS
1.0000 | ORAL_TABLET | ORAL | Status: DC | PRN
  Administered 2016-02-03 – 2016-02-07 (×14): 2 via ORAL
  Filled 2016-02-03 (×14): qty 2

## 2016-02-03 MED ORDER — FLEET ENEMA 7-19 GM/118ML RE ENEM
1.0000 | ENEMA | Freq: Once | RECTAL | Status: DC | PRN
  Filled 2016-02-03: qty 1

## 2016-02-03 MED ORDER — POLYETHYLENE GLYCOL 3350 17 G PO PACK: 17.0000 g | PACK | Freq: Every day | ORAL | Status: DC | PRN

## 2016-02-03 MED ORDER — ONDANSETRON HCL 4 MG/2ML IJ SOLN
4.0000 mg | INTRAMUSCULAR | Status: DC | PRN
  Administered 2016-02-04 – 2016-02-09 (×7): 4 mg via INTRAVENOUS
  Filled 2016-02-03 (×8): qty 2

## 2016-02-03 MED ORDER — ETOMIDATE 2 MG/ML IV SOLN
INTRAVENOUS | Status: DC | PRN
  Administered 2016-02-03: 2 mg via INTRAVENOUS
  Administered 2016-02-03: 6 mg via INTRAVENOUS
  Administered 2016-02-03: 8 mg via INTRAVENOUS
  Administered 2016-02-03: 6 mg via INTRAVENOUS
  Administered 2016-02-03: 2 mg via INTRAVENOUS

## 2016-02-03 MED ORDER — MENTHOL 3 MG MT LOZG: 1.0000 | LOZENGE | OROMUCOSAL | Status: DC | PRN

## 2016-02-03 MED ORDER — SUCCINYLCHOLINE CHLORIDE 20 MG/ML IJ SOLN
INTRAMUSCULAR | Status: AC
  Filled 2016-02-03: qty 1

## 2016-02-03 MED ORDER — DEXAMETHASONE SODIUM PHOSPHATE 4 MG/ML IJ SOLN
INTRAMUSCULAR | Status: DC | PRN
  Administered 2016-02-03: 4 mg via INTRAVENOUS

## 2016-02-03 MED ORDER — ALBUMIN HUMAN 5 % IV SOLN
INTRAVENOUS | Status: AC
  Administered 2016-02-03: 12.5 g via INTRAVENOUS
  Filled 2016-02-03: qty 250

## 2016-02-03 MED ORDER — HYDROMORPHONE HCL 1 MG/ML IJ SOLN
0.5000 mg | INTRAMUSCULAR | Status: DC | PRN
  Administered 2016-02-04: 0.5 mg via INTRAVENOUS
  Administered 2016-02-04 – 2016-02-06 (×5): 1 mg via INTRAVENOUS
  Filled 2016-02-03 (×6): qty 1

## 2016-02-03 MED ORDER — METHOCARBAMOL 500 MG PO TABS
500.0000 mg | ORAL_TABLET | Freq: Four times a day (QID) | ORAL | Status: DC | PRN
  Administered 2016-02-04 – 2016-02-05 (×3): 500 mg via ORAL
  Filled 2016-02-03 (×3): qty 1

## 2016-02-03 MED ORDER — NOREPINEPHRINE BITARTRATE 1 MG/ML IV SOLN
2.0000 ug/min | INTRAVENOUS | Status: DC
  Filled 2016-02-03: qty 4

## 2016-02-03 MED ORDER — PROMETHAZINE HCL 25 MG/ML IJ SOLN
INTRAMUSCULAR | Status: AC
  Administered 2016-02-03: 6.25 mg via INTRAVENOUS
  Filled 2016-02-03: qty 1

## 2016-02-03 MED ORDER — FENTANYL CITRATE (PF) 250 MCG/5ML IJ SOLN
INTRAMUSCULAR | Status: AC
  Filled 2016-02-03: qty 5

## 2016-02-03 MED ORDER — ACETAMINOPHEN 325 MG PO TABS
650.0000 mg | ORAL_TABLET | ORAL | Status: DC | PRN
  Administered 2016-02-04 – 2016-02-09 (×2): 650 mg via ORAL
  Filled 2016-02-03 (×2): qty 2

## 2016-02-03 MED ORDER — ACETAMINOPHEN 325 MG PO TABS: 325.0000 mg | ORAL_TABLET | ORAL | Status: DC | PRN

## 2016-02-03 MED ORDER — KETAMINE HCL 100 MG/ML IJ SOLN
INTRAMUSCULAR | Status: AC
  Filled 2016-02-03: qty 1

## 2016-02-03 MED ORDER — BUPIVACAINE HCL (PF) 0.5 % IJ SOLN
INTRAMUSCULAR | Status: DC | PRN
  Administered 2016-02-03: 10 mL

## 2016-02-03 MED ORDER — ONDANSETRON HCL 4 MG/2ML IJ SOLN
INTRAMUSCULAR | Status: DC | PRN
  Administered 2016-02-03: 4 mg via INTRAVENOUS

## 2016-02-03 MED ORDER — ALBUMIN HUMAN 5 % IV SOLN
INTRAVENOUS | Status: DC | PRN
  Administered 2016-02-03 (×2): via INTRAVENOUS

## 2016-02-03 MED ORDER — PROMETHAZINE HCL 25 MG/ML IJ SOLN
6.2500 mg | Freq: Once | INTRAMUSCULAR | Status: AC
  Administered 2016-02-03: 6.25 mg via INTRAVENOUS

## 2016-02-03 MED ORDER — ARTIFICIAL TEARS OP OINT
TOPICAL_OINTMENT | OPHTHALMIC | Status: DC | PRN
  Administered 2016-02-03: 1 via OPHTHALMIC

## 2016-02-03 MED ORDER — ALBUMIN HUMAN 5 % IV SOLN
12.5000 g | Freq: Once | INTRAVENOUS | Status: AC
  Administered 2016-02-03: 12.5 g via INTRAVENOUS

## 2016-02-03 MED ORDER — CEFAZOLIN SODIUM-DEXTROSE 2-4 GM/100ML-% IV SOLN
2.0000 g | Freq: Three times a day (TID) | INTRAVENOUS | Status: AC
  Administered 2016-02-03 (×2): 2 g via INTRAVENOUS
  Filled 2016-02-03 (×2): qty 100

## 2016-02-03 MED ORDER — ALUM & MAG HYDROXIDE-SIMETH 200-200-20 MG/5ML PO SUSP: 30.0000 mL | Freq: Four times a day (QID) | ORAL | Status: DC | PRN

## 2016-02-03 MED ORDER — SODIUM CHLORIDE 0.9 % IV SOLN: 250.0000 mL | INTRAVENOUS | Status: DC

## 2016-02-03 MED ORDER — BUPIVACAINE LIPOSOME 1.3 % IJ SUSP
20.0000 mL | INTRAMUSCULAR | Status: AC
  Administered 2016-02-03: 20 mL
  Filled 2016-02-03: qty 20

## 2016-02-03 MED ORDER — OXYCODONE-ACETAMINOPHEN 5-325 MG PO TABS
1.0000 | ORAL_TABLET | ORAL | Status: DC | PRN
  Administered 2016-02-07 – 2016-02-09 (×4): 2 via ORAL
  Filled 2016-02-03 (×5): qty 2

## 2016-02-03 MED ORDER — LIDOCAINE HCL (CARDIAC) 20 MG/ML IV SOLN
INTRAVENOUS | Status: AC
  Filled 2016-02-03: qty 5

## 2016-02-03 MED ORDER — CEFAZOLIN SODIUM-DEXTROSE 2-3 GM-% IV SOLR
INTRAVENOUS | Status: DC | PRN
  Administered 2016-02-03: 2 g via INTRAVENOUS

## 2016-02-03 MED ORDER — SODIUM CHLORIDE 0.9% FLUSH
3.0000 mL | Freq: Two times a day (BID) | INTRAVENOUS | Status: DC
  Administered 2016-02-03 – 2016-02-09 (×14): 3 mL via INTRAVENOUS

## 2016-02-03 MED ORDER — SODIUM CHLORIDE 0.9 % IV SOLN
0.0125 ug/kg/min | INTRAVENOUS | Status: AC
  Administered 2016-02-03: 0.15 ug/kg/min via INTRAVENOUS
  Filled 2016-02-03: qty 2000

## 2016-02-03 MED ORDER — OXYCODONE HCL 5 MG/5ML PO SOLN: 5.0000 mg | Freq: Once | ORAL | Status: DC | PRN

## 2016-02-03 MED ORDER — ROCURONIUM BROMIDE 50 MG/5ML IV SOLN
INTRAVENOUS | Status: AC
  Filled 2016-02-03: qty 1

## 2016-02-03 MED ORDER — SODIUM CHLORIDE 0.9 % IJ SOLN
INTRAMUSCULAR | Status: AC
  Filled 2016-02-03: qty 10

## 2016-02-03 MED ORDER — LIDOCAINE-EPINEPHRINE 1 %-1:100000 IJ SOLN
INTRAMUSCULAR | Status: DC | PRN
  Administered 2016-02-03: 10 mL

## 2016-02-03 MED ORDER — HEPARIN (PORCINE) IN NACL 100-0.45 UNIT/ML-% IJ SOLN: 1150.0000 [IU]/h | INTRAMUSCULAR | Status: DC

## 2016-02-03 MED ORDER — HYDROMORPHONE HCL 1 MG/ML IJ SOLN: 0.2500 mg | INTRAMUSCULAR | Status: DC | PRN

## 2016-02-03 MED ORDER — PHENOL 1.4 % MT LIQD: 1.0000 | OROMUCOSAL | Status: DC | PRN

## 2016-02-03 MED ORDER — ACETAMINOPHEN 10 MG/ML IV SOLN
INTRAVENOUS | Status: DC | PRN
  Administered 2016-02-03: 1000 mg via INTRAVENOUS

## 2016-02-03 MED ORDER — MIDAZOLAM HCL 5 MG/5ML IJ SOLN
INTRAMUSCULAR | Status: DC | PRN
  Administered 2016-02-03: 0.5 mg via INTRAVENOUS
  Administered 2016-02-03: 1 mg via INTRAVENOUS
  Administered 2016-02-03: 0.5 mg via INTRAVENOUS

## 2016-02-03 MED ORDER — ACETAMINOPHEN 160 MG/5ML PO SOLN
325.0000 mg | ORAL | Status: DC | PRN
  Filled 2016-02-03: qty 20.3

## 2016-02-03 MED ORDER — LACTATED RINGERS IV SOLN
INTRAVENOUS | Status: DC | PRN
  Administered 2016-02-03 (×2): via INTRAVENOUS

## 2016-02-03 MED ORDER — SUCCINYLCHOLINE CHLORIDE 20 MG/ML IJ SOLN
INTRAMUSCULAR | Status: DC | PRN
  Administered 2016-02-03: 40 mg via INTRAVENOUS
  Administered 2016-02-03: 10 mg via INTRAVENOUS

## 2016-02-03 MED ORDER — ETOMIDATE 2 MG/ML IV SOLN
INTRAVENOUS | Status: AC
  Filled 2016-02-03: qty 10

## 2016-02-03 MED ORDER — METHOCARBAMOL 1000 MG/10ML IJ SOLN
500.0000 mg | Freq: Four times a day (QID) | INTRAVENOUS | Status: DC | PRN
  Filled 2016-02-03: qty 5

## 2016-02-03 MED ORDER — DOCUSATE SODIUM 100 MG PO CAPS
100.0000 mg | ORAL_CAPSULE | Freq: Two times a day (BID) | ORAL | Status: DC
  Administered 2016-02-03 – 2016-02-10 (×14): 100 mg via ORAL
  Filled 2016-02-03 (×14): qty 1

## 2016-02-03 MED ORDER — 0.9 % SODIUM CHLORIDE (POUR BTL) OPTIME
TOPICAL | Status: DC | PRN
  Administered 2016-02-03: 1000 mL

## 2016-02-03 MED ORDER — PROPOFOL 10 MG/ML IV BOLUS
INTRAVENOUS | Status: AC
  Filled 2016-02-03: qty 20

## 2016-02-03 MED ORDER — MIDAZOLAM HCL 2 MG/2ML IJ SOLN
INTRAMUSCULAR | Status: AC
  Filled 2016-02-03: qty 2

## 2016-02-03 MED ORDER — SODIUM CHLORIDE 0.9% FLUSH: 3.0000 mL | INTRAVENOUS | Status: DC | PRN

## 2016-02-03 MED ORDER — EPHEDRINE SULFATE 50 MG/ML IJ SOLN
INTRAMUSCULAR | Status: AC
  Filled 2016-02-03: qty 1

## 2016-02-03 MED ORDER — KCL IN DEXTROSE-NACL 20-5-0.45 MEQ/L-%-% IV SOLN
INTRAVENOUS | Status: DC
  Administered 2016-02-03 – 2016-02-04 (×2): via INTRAVENOUS
  Filled 2016-02-03 (×14): qty 1000

## 2016-02-03 MED ORDER — ACETAMINOPHEN 650 MG RE SUPP: 650.0000 mg | RECTAL | Status: DC | PRN

## 2016-02-03 MED ORDER — EPINEPHRINE HCL 1 MG/ML IJ SOLN
4000.0000 ug | INTRAVENOUS | Status: DC | PRN
  Administered 2016-02-03: 2 ug/min via INTRAVENOUS

## 2016-02-03 MED ORDER — FENTANYL CITRATE (PF) 100 MCG/2ML IJ SOLN
INTRAMUSCULAR | Status: DC | PRN
  Administered 2016-02-03: 50 ug via INTRAVENOUS
  Administered 2016-02-03 (×2): 25 ug via INTRAVENOUS
  Administered 2016-02-03: 100 ug via INTRAVENOUS
  Administered 2016-02-03: 50 ug via INTRAVENOUS

## 2016-02-03 MED ORDER — SURGIFOAM 100 EX MISC
CUTANEOUS | Status: DC | PRN
  Administered 2016-02-03: 20 mL via TOPICAL

## 2016-02-03 MED ORDER — ACETAMINOPHEN 10 MG/ML IV SOLN
INTRAVENOUS | Status: AC
  Filled 2016-02-03: qty 100

## 2016-02-03 MED ORDER — BISACODYL 10 MG RE SUPP: 10.0000 mg | Freq: Every day | RECTAL | Status: DC | PRN

## 2016-02-03 SURGICAL SUPPLY — 78 items
ADH SKN CLS APL DERMABOND .7 (GAUZE/BANDAGES/DRESSINGS) ×2
BENZOIN TINCTURE PRP APPL 2/3 (GAUZE/BANDAGES/DRESSINGS) ×2 IMPLANT
BLADE CLIPPER SURG (BLADE) IMPLANT
BONE CANC CHIPS 40CC CAN1/2 (Bone Implant) ×2 IMPLANT
BUR MATCHSTICK NEURO 3.0 LAGG (BURR) ×2 IMPLANT
BUR PRECISION FLUTE 5.0 (BURR) ×2 IMPLANT
CANISTER SUCT 3000ML PPV (MISCELLANEOUS) ×2 IMPLANT
CHIPS CANC BONE 40CC CAN1/2 (Bone Implant) ×1 IMPLANT
CLIP NEUROVISION LG (CLIP) ×2 IMPLANT
CONT SPEC 4OZ CLIKSEAL STRL BL (MISCELLANEOUS) ×2 IMPLANT
COVER BACK TABLE 60X90IN (DRAPES) ×2 IMPLANT
DECANTER SPIKE VIAL GLASS SM (MISCELLANEOUS) ×2 IMPLANT
DERMABOND ADVANCED (GAUZE/BANDAGES/DRESSINGS) ×2
DERMABOND ADVANCED .7 DNX12 (GAUZE/BANDAGES/DRESSINGS) ×2 IMPLANT
DRAPE C-ARM 42X72 X-RAY (DRAPES) ×4 IMPLANT
DRAPE C-ARMOR (DRAPES) IMPLANT
DRAPE INCISE IOBAN 66X45 STRL (DRAPES) ×2 IMPLANT
DRAPE LAPAROTOMY 100X72X124 (DRAPES) ×2 IMPLANT
DRAPE POUCH INSTRU U-SHP 10X18 (DRAPES) ×2 IMPLANT
DRAPE SURG 17X23 STRL (DRAPES) ×2 IMPLANT
DRSG OPSITE POSTOP 4X8 (GAUZE/BANDAGES/DRESSINGS) ×4 IMPLANT
DURAPREP 26ML APPLICATOR (WOUND CARE) ×2 IMPLANT
ELECT REM PT RETURN 9FT ADLT (ELECTROSURGICAL) ×2
ELECTRODE REM PT RTRN 9FT ADLT (ELECTROSURGICAL) ×1 IMPLANT
EVACUATOR 1/8 PVC DRAIN (DRAIN) ×2 IMPLANT
GAUZE SPONGE 4X4 12PLY STRL (GAUZE/BANDAGES/DRESSINGS) ×2 IMPLANT
GAUZE SPONGE 4X4 16PLY XRAY LF (GAUZE/BANDAGES/DRESSINGS) IMPLANT
GLOVE BIO SURGEON STRL SZ8 (GLOVE) ×4 IMPLANT
GLOVE BIOGEL PI IND STRL 8 (GLOVE) ×2 IMPLANT
GLOVE BIOGEL PI IND STRL 8.5 (GLOVE) ×2 IMPLANT
GLOVE BIOGEL PI INDICATOR 8 (GLOVE) ×2
GLOVE BIOGEL PI INDICATOR 8.5 (GLOVE) ×2
GLOVE ECLIPSE 8.0 STRL XLNG CF (GLOVE) ×4 IMPLANT
GLOVE EXAM NITRILE LRG STRL (GLOVE) IMPLANT
GLOVE EXAM NITRILE MD LF STRL (GLOVE) IMPLANT
GLOVE EXAM NITRILE XL STR (GLOVE) IMPLANT
GLOVE EXAM NITRILE XS STR PU (GLOVE) IMPLANT
GOWN STRL REUS W/ TWL LRG LVL3 (GOWN DISPOSABLE) IMPLANT
GOWN STRL REUS W/ TWL XL LVL3 (GOWN DISPOSABLE) ×2 IMPLANT
GOWN STRL REUS W/TWL 2XL LVL3 (GOWN DISPOSABLE) ×4 IMPLANT
GOWN STRL REUS W/TWL LRG LVL3 (GOWN DISPOSABLE)
GOWN STRL REUS W/TWL XL LVL3 (GOWN DISPOSABLE) ×4
GUIDEWIRE NITINOL BEVEL TIP (WIRE) ×8 IMPLANT
KIT BASIN OR (CUSTOM PROCEDURE TRAY) ×2 IMPLANT
KIT INFUSE SMALL (Orthopedic Implant) ×2 IMPLANT
KIT POSITION SURG JACKSON T1 (MISCELLANEOUS) ×2 IMPLANT
KIT ROOM TURNOVER OR (KITS) ×2 IMPLANT
MILL MEDIUM DISP (BLADE) ×2 IMPLANT
MIX DBX 10CC 35% BONE (Bone Implant) ×2 IMPLANT
MODULE NVM5 NEXT GEN EMG (NEEDLE) ×2 IMPLANT
NEEDLE HYPO 25X1 1.5 SAFETY (NEEDLE) ×2 IMPLANT
NEEDLE I PASS (NEEDLE) ×2 IMPLANT
NEEDLE SPNL 18GX3.5 QUINCKE PK (NEEDLE) IMPLANT
NS IRRIG 1000ML POUR BTL (IV SOLUTION) ×2 IMPLANT
PACK LAMINECTOMY NEURO (CUSTOM PROCEDURE TRAY) ×2 IMPLANT
PAD ARMBOARD 7.5X6 YLW CONV (MISCELLANEOUS) ×6 IMPLANT
PATTIES SURGICAL .5 X.5 (GAUZE/BANDAGES/DRESSINGS) IMPLANT
PATTIES SURGICAL .5 X1 (DISPOSABLE) IMPLANT
PATTIES SURGICAL 1X1 (DISPOSABLE) IMPLANT
ROD RELINE MAS LORD 5.5X75MM (Rod) ×4 IMPLANT
SCREW LOCK RELINE 5.5 TULIP (Screw) ×8 IMPLANT
SCREW RELINE MAS POLY 7.5X45MM (Screw) ×8 IMPLANT
SPONGE LAP 4X18 X RAY DECT (DISPOSABLE) IMPLANT
SPONGE SURGIFOAM ABS GEL 100 (HEMOSTASIS) ×2 IMPLANT
STAPLER PROXIMATE (STAPLE) ×2 IMPLANT
STAPLER SKIN PROX WIDE 3.9 (STAPLE) IMPLANT
STRIP CLOSURE SKIN 1/2X4 (GAUZE/BANDAGES/DRESSINGS) ×2 IMPLANT
SUT VIC AB 1 CT1 18XBRD ANBCTR (SUTURE) ×2 IMPLANT
SUT VIC AB 1 CT1 8-18 (SUTURE) ×2
SUT VIC AB 2-0 CT1 18 (SUTURE) ×4 IMPLANT
SUT VIC AB 3-0 SH 8-18 (SUTURE) ×4 IMPLANT
SYR 3ML LL SCALE MARK (SYRINGE) ×4 IMPLANT
SYR 5ML LL (SYRINGE) IMPLANT
TOWEL OR 17X24 6PK STRL BLUE (TOWEL DISPOSABLE) ×2 IMPLANT
TOWEL OR 17X26 10 PK STRL BLUE (TOWEL DISPOSABLE) ×2 IMPLANT
TRAP SPECIMEN MUCOUS 40CC (MISCELLANEOUS) ×2 IMPLANT
TRAY FOLEY W/METER SILVER 14FR (SET/KITS/TRAYS/PACK) ×2 IMPLANT
WATER STERILE IRR 1000ML POUR (IV SOLUTION) ×2 IMPLANT

## 2016-02-03 NOTE — Addendum Note (Signed)
Addendum  created 02/03/16 1429 by Oleta Mouse, MD   Modules edited: Anesthesia Events, Narrator   Narrator:  Narrator: Event Log Edited

## 2016-02-03 NOTE — Anesthesia Postprocedure Evaluation (Signed)
Anesthesia Post Note  Patient: [name]  Procedure(s) Performed: Procedure(s) (LRB): L3 to L5 Lumbar fusion (no interbodies) (N/A)  Patient location during evaluation: PACU Anesthesia Type: General Level of consciousness: awake and alert Pain management: pain level controlled Vital Signs Assessment: post-procedure vital signs reviewed and stable Respiratory status: spontaneous breathing and respiratory function stable Cardiovascular status: stable Anesthetic complications: no    Last Vitals:  Filed Vitals:   02/03/16 1245 02/03/16 1332  BP:    Pulse:  58  Temp:  37 C  Resp: 15 13    Last Pain:  Filed Vitals:   02/03/16 1354  PainSc: 9                  Markiesha Delia

## 2016-02-03 NOTE — Anesthesia Procedure Notes (Addendum)
Procedure Name: Intubation Date/Time: 02/03/2016 8:01 AM Performed by: Merrilyn Puma B Pre-anesthesia Checklist: Patient identified, Emergency Drugs available, Suction available, Patient being monitored and Timeout performed Patient Re-evaluated:Patient Re-evaluated prior to inductionOxygen Delivery Method: Circle system utilized Preoxygenation: Pre-oxygenation with 100% oxygen Intubation Type: IV induction Ventilation: Mask ventilation without difficulty Laryngoscope Size: Mac and 4 Grade View: Grade I Tube type: Oral Tube size: 7.5 mm Number of attempts: 1 Airway Equipment and Method: Stylet Placement Confirmation: CO2 detector,  positive ETCO2,  ETT inserted through vocal cords under direct vision and breath sounds checked- equal and bilateral Secured at: 22 cm Tube secured with: Tape Dental Injury: Teeth and Oropharynx as per pre-operative assessment    Central Venous Catheter Insertion Performed by: anesthesiologist Patient location: Pre-op. Preanesthetic checklist: patient identified, IV checked, site marked, risks and benefits discussed, surgical consent, monitors and equipment checked, pre-op evaluation, timeout performed and anesthesia consent Position: Trendelenburg Landmarks identified and Seldinger technique used Catheter size: 8 Fr Central line was placed.Double lumen Procedure performed using ultrasound guided technique. Attempts: 1 Following insertion, dressing applied, line sutured and Biopatch. Post procedure assessment: blood return through all ports. Patient tolerated the procedure well with no immediate complications.

## 2016-02-03 NOTE — Progress Notes (Signed)
HeartMate 2 Rounding Note  Subjective:    S/p L3 to L5 Fusion 02/03/16  Remains on 4 of Levophed.  Doppler pressures had dropped to 60s (lowest actually 56)  and PI dropped to 3 when initially stopped after surgery.   Resting comfortably R lateral position. Denies SOB. States he is still sore.  Still pretty sleepy from anaesthesia.   INR 1.63 this am. Heparin on hold currently s/p surgery.    LVAD INTERROGATION:  HeartMate II LVAD:  Flow 4.4 liters/min, speed 9000, power 4.9, PI 6.8  Objective:    Vital Signs:   Temp:  [98.1 F (36.7 C)-98.6 F (37 C)] 98.6 F (37 C) (03/31 1332) Pulse Rate:  [30-64] 58 (03/31 1332) Resp:  [11-16] 13 (03/31 1332) BP: (89-96)/(70-78) 89/70 mmHg (03/30 2030) SpO2:  [95 %-100 %] 99 % (03/31 1332) Arterial Line BP: (64-113)/(58-82) 113/82 mmHg (03/31 1332) Weight:  [182 lb 1.6 oz (82.6 kg)-182 lb 4.8 oz (82.691 kg)] 182 lb 1.6 oz (82.6 kg) (03/31 1332) Last BM Date: 02/02/16 Mean arterial Pressure 60-80s   Intake/Output:   Intake/Output Summary (Last 24 hours) at 02/03/16 1406 Last data filed at 02/03/16 1215  Gross per 24 hour  Intake   1703 ml  Output    825 ml  Net    878 ml     Physical Exam: General: Elderly appearing. NAD HEENT: normal Neck: supple. JVP 7-8 cm. R CEA site C/D/I Carotids 2+ bilat; no bruits. No thyromegaly or nodule noted. Cor: Mechanical heart sounds with LVAD hum present. Lungs: CTAB, normal effort Abdomen: soft, NT, ND, no HSM. No bruits or masses. +BS Driveline: C/D/I; securement device intact and driveline incorporated Extremities: no cyanosis, clubbing, rash, edema Neuro: alert & orientedx3, cranial nerves grossly intact. moves all 4 extremities w/o difficulty. Affect pleasant  Telemetry: Reviewed, A pacing 50-60s  Labs: Basic Metabolic Panel:  Recent Labs Lab 02/01/16 1646 02/03/16 0914  NA 136 138  K 4.2 4.1  CL 105  --   CO2 22  --   GLUCOSE 121*  --   BUN 22*  --   CREATININE 1.47*  --    CALCIUM 8.8*  --     Liver Function Tests:  Recent Labs Lab 02/01/16 1646  AST 30  ALT 19  ALKPHOS 123  BILITOT 0.6  PROT 6.4*  ALBUMIN 3.3*   No results for input(s): LIPASE, AMYLASE in the last 168 hours. No results for input(s): AMMONIA in the last 168 hours.  CBC:  Recent Labs Lab 02/02/16 0420 02/03/16 0340 02/03/16 0914  WBC 6.9 6.2  --   HGB 10.2* 10.1* 10.9*  HCT 32.4* 31.8* 32.0*  MCV 90.3 90.1  --   PLT 191 176  --     INR:  Recent Labs Lab 01/30/16 02/01/16 1646 02/02/16 0420 02/02/16 1533 02/03/16 0340  INR 1.7 1.77* 1.79* 1.80* 1.63*    Other results:  EKG:   Imaging: Dg Lumbar Spine 2-3 Views  02/03/2016  CLINICAL DATA:  Lumbar surgery. EXAM: LUMBAR SPINE - 2-3 VIEW; DG C-ARM 61-120 MIN COMPARISON:  12/12/2015 FINDINGS: Lumbar vertebra numbered with the lowest segmented appearing vertebral lateral view as L5 . L3 through L5 posterior fusion. Hardware intact. L4 compression fracture noted. IMPRESSION: L3-L5 posterior fusion.  Hardware intact.  Good anatomic alignment. 2. L4 compression fracture.  Stable from 12/12/2015. Electronically Signed   By: Marcello Moores  Register   On: 02/03/2016 09:54   Dg C-arm 1-60 Min  02/03/2016  CLINICAL DATA:  Lumbar surgery. EXAM: LUMBAR SPINE - 2-3 VIEW; DG C-ARM 61-120 MIN COMPARISON:  12/12/2015 FINDINGS: Lumbar vertebra numbered with the lowest segmented appearing vertebral lateral view as L5 . L3 through L5 posterior fusion. Hardware intact. L4 compression fracture noted. IMPRESSION: L3-L5 posterior fusion.  Hardware intact.  Good anatomic alignment. 2. L4 compression fracture.  Stable from 12/12/2015. Electronically Signed   By: Marcello Moores  Register   On: 02/03/2016 09:54     Medications:     Scheduled Medications: . sodium chloride   Intravenous Once  . amiodarone  200 mg Oral Daily  . amLODipine  10 mg Oral QHS  . atorvastatin  80 mg Oral Daily  .  ceFAZolin (ANCEF) IV  2 g Intravenous Q8H  . docusate sodium   100 mg Oral BID  . epinephrine  0.5-20 mcg/min Intravenous To OR  . ferrous gluconate  324 mg Oral BID WC  . guaiFENesin  600 mg Oral BID  . levothyroxine  200-300 mcg Oral Once per day on Sun Tue Wed Thu Fri Sat  . [START ON 02/06/2016] levothyroxine  300 mcg Oral Once per day on Mon  . metoprolol succinate  25 mg Oral BID  . pantoprazole  40 mg Oral BID  . ranolazine  500 mg Oral BID  . sodium chloride flush  3 mL Intravenous Q12H    Infusions: . sodium chloride    . dextrose 5 % and 0.45 % NaCl with KCl 20 mEq/L    . [START ON 02/04/2016] heparin    . norepinephrine (LEVOPHED) Adult infusion 4 mcg/min (02/03/16 1332)    PRN Medications: acetaminophen **OR** acetaminophen, alum & mag hydroxide-simeth, bisacodyl, docusate sodium, furosemide, HYDROcodone-acetaminophen, HYDROmorphone (DILAUDID) injection, menthol-cetylpyridinium **OR** phenol, methocarbamol **OR** methocarbamol (ROBAXIN)  IV, nitroGLYCERIN, ondansetron (ZOFRAN) IV, oxyCODONE-acetaminophen, polyethylene glycol, promethazine, sodium chloride flush, sodium phosphate   Assessment:   1. Debilitating back pain due to lumbar compression fracture - Major limiting factor of his functional status 2. 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 PAF/AFL with RVR s/p TEEdccv on 01/19/16 4. CKD stage III 5. 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 Now on Plavix and warfarin at home.  6. High-grade R internal carotid stenosis - s/p R CEA 01/13/16 - Had f/u 3/28. Stable.   Plan/Discussion:    HF stable. Coumadin on hold. Heparin on hold for now with surgery.  Heparin ordered to restart tomorrow at 1200. MD will round and reviewed prior to resumption, Pharm-D aware.   He tolerated surgery well. Activity per surgery. Pain management may be an issue. We will follow closely. Dr Vertell Limber is on all weekend. We appreciate him following along.   I reviewed the LVAD parameters from today, and compared the results to the patient's  prior recorded data.  No programming changes were made.  The LVAD is functioning within specified parameters.  The patient performs LVAD self-test daily.  LVAD interrogation was negative for any significant power changes, alarms or PI events/speed drops.  LVAD equipment check completed and is in good working order.  Back-up equipment present.   LVAD education done on emergency procedures and precautions and reviewed exit site care.  Length of Stay: 2  Shirley Friar 02/03/2016, 2:06 PM  VAD Team --- VAD ISSUES ONLY--- Pager [phone] (7am - 7am)  Advanced Heart Failure Team  Pager [phone] (M-F; 7a - 4p)  Please contact [facility] for night-coverage after hours (4p -7a ) and weekends on [email or web address]  Patient seen  and examined with Oda Kilts, PA-C. We discussed all aspects of the encounter. I agree with the assessment and plan as stated above.   Doing well post-op. MAPs still low requiring pressor support. Will wean as tolerated. Maintaining NSR. Will restart heparin and coumadin tomorrow midday. Pain management currently adequate. VAD parameters ok.   Demichael Traum,MD 8:30 PM

## 2016-02-03 NOTE — H&P (View-Only) (Signed)
Patient ID:   [id number] Patient: [name]  Date of Birth: [birth year] Visit Type: Office Visit   Date: 12/12/2015 04:00 PM Provider: Marchia Meiers. Vertell Limber MD   This 75 year old male presents for back pain.  History of Present Illness: 1.  back pain  The patient returns and continues to complain of disabling back pain.  He had a mild stroke on December 30 and had some transient weakness in his left arm and left leg but this has completely resolved.  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  ET did not show any gross abnormality but this is not a subtle test for assessing a small stroke.  I discussed the patient's situation with his cardiology team and we recommended holding off on surgery because of his recent stroke.  He now comes in a partially 6 weeks out from this event and says he is completely back to normal.  He continues to complain of severe low back pain and says that he is disabled as a result of this.  At this point I think it is reasonable to proceed with surgery although I again advised him of the risks of surgery and general anesthesia was could certainly lead to death.  The patient understands these risks and wishes to proceed with surgery.  He will need to come into the hospital for anticoagulation management before surgery and will proceed with L3 through L5 instrumented fusion with posterolateral arthrodesis.  I reviewed new lumbar imaging and these do not show significant worsening of the compression fracture.  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      Medical/Surgical/Interim History Reviewed, no change.  Last detailed document date:09/26/2015.   PAST MEDICAL HISTORY, SURGICAL HISTORY, FAMILY HISTORY, SOCIAL HISTORY AND REVIEW OF SYSTEMS I have reviewed the patient's past medical, surgical, family and social history as well as the comprehensive review of systems as  included on the [facility] history form dated 09/26/2015, which I have signed.  Family History: Reviewed, no changes.  Last detailed document: 09/26/2015.   Social History: Tobacco use reviewed. Reviewed, no changes. Last detailed document date: 09/26/2015.      MEDICATIONS(added, continued or stopped this visit): Started Medication Directions Instruction Stopped   amlodipine 5 mg tablet take 1 tablet by oral route  every day     atorvastatin 80 mg tablet take 1 tablet by oral route  every day     Colace take 1 capsule by oral route 2 times every day     ICaps Take twice daily     iron take 1 tablet by oral route 2 times every day     levothyroxine 200 mcg tablet take 1 tablet by oral route  every day     multivitamin Take once daily     pantoprazole 40 mg tablet,delayed release take 1 tablet by oral route 2 times every day     Ranexa 500 mg tablet,extended release take 1 tablet by oral route 2 times every day     WARFARIN SODIUM Take 4mg  on Tuesday and Thursday and 2mg  all other days     zinc 50 mg tablet Take twice daily       ALLERGIES: Ingredient Reaction Medication Name Comment  MEPERIDINE HCL  DEMEROL    Reviewed, no changes.    Vitals Date Temp F BP Pulse Ht In Wt Lb  BMI BSA Pain Score  12/12/2015    72 198 26.85  7/10      IMPRESSION The patient is a small stroke from which he appears to have recovered.  He is still having disabling back pain and wants to proceed with surgery.  Comments:  Will be coordinated with Dr. Haroldine Laws & LVAD coordinator  Completed Orders (this encounter) Order Details Reason Side Interpretation Result Initial Treatment Date Region  Lumbar Spine- AP/Lat/Flex/Ex      12/12/2015   Lifestyle education regarding diet Encouraged to eat a well balanced diet and follow up with primary care physician.         Assessment/Plan # Detail Type Description   1. Assessment LVAD (left ventricular assist device)  present (Z95.811).       2. Assessment Low back pain (M54.5).       3. Assessment Body mass index (BMI) 26.0-26.9, adult [place]:459269).   Plan Orders Today's instructions / counseling include(s) Lifestyle education regarding diet.       4. Assessment Lumbar compression fracture, closed, initial encounter (S32.000A).         Pain Assessment/Treatment Pain Scale: 7/10. Method: Numeric Pain Intensity Scale. Location: back. Onset: 02/11/2015. Duration: varies. Quality: discomforting. Pain Assessment/Treatment follow-up plan of care: Patient is taking medications as prescribed..  Fall Risk Plan The patient has not fallen in the last year.  L3 through L5 posterior instrumented fusion with posterolateral arthrodesis.  Risks and benefits were discussed in detail with the patient wished to proceed.  Orders: Diagnostic Procedures: Assessment Procedure  M54.5 Lumbar Spine- AP/Lat/Flex/Ex  S32.000A Lumbar Fusion - L3 - L4 - L5 (no interbodies)  S32.000A posterior lateral fusion - L3 - L5  Instruction(s)/Education: Assessment Instruction  [phone] Lifestyle education regarding diet             Provider:  Marchia Meiers. Vertell Limber MD  12/17/2015 01:19 PM Dictation edited by: Marchia Meiers. Vertell Limber    CC Providers: Shaune Pascal Bensimhon [address] [place], [place] [address]-              Electronically signed by Marchia Meiers Vertell Limber MD on 12/17/2015 01:19 PM  Patient ID:   [id number] Patient: [name]  Date of Birth: 09/02/1941 Visit Type: Office Visit   Date: 09/26/2015 12:00 PM Provider: Marchia Meiers. Vertell Limber MD   This 75 year old male presents for back pain.  History of Present Illness: 1.  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  He states initial injury was February 11, 2015 after which he healed and return to work.  Unfortunately on his first day back at work, June 13, 2015, he fell backwards down a flight of  stairs.  Back pain prompted imaging revealing worsening fracture.  Tylenol only as needed  History: Heart pump (LVAD) two years ago, HTN, MI, brainstem stroke 2000, COPD Surgical history: Listed in Epic  Lumbar CT on [place]  I reviewed the patient's CT scan of the lumbar spine which shows a healed L4 compression fracture with necrosis of the superior aspect of the vertebra including the superior endplate.  The patient is currently describing 6 out of 10 pain and says that this is persisting since April.  He says he can no longer do anything.  He has had a LVAD for the last 2 years and has been doing well with this.  He is on Coumadin for chronic anticoagulation for his left ventricular assist device.  I reviewed the patient's imaging studies and explained to him that there would be no option of kyphoplasty procedure given the sclerotic margin and absent superior aspect of the vertebra.  I told him that if he wants to get some relief this would likely require posterior lumbar fusion with implanted hardware without interbody fusion.  The patient feels that he is unable to tolerate his current level of discomfort in the deep does want to proceed with surgery.        PAST MEDICAL/SURGICAL HISTORY   (Detailed)  Disease/disorder Onset Date Management Date Comments  High cholesterol      Hypertension      Thyroid disease         PAST MEDICAL HISTORY, SURGICAL HISTORY, FAMILY HISTORY, SOCIAL HISTORY AND REVIEW OF SYSTEMS I have reviewed the patient's past medical, surgical, family and social history as well as the comprehensive review of systems as included on the [facility] history form dated 09/26/2015, which I have signed.  Family History  (Detailed) Relationship Family Member Name Deceased Age at Death Condition Onset Age Cause of Death      Family history of Hypertension  N  Father    Stroke  N  Mother    Congestive heart failure  N    SOCIAL HISTORY   (Detailed) Tobacco use reviewed. Preferred language is Unknown.   Smoking status: Former smoker.  SMOKING STATUS Use Status Type Smoking Status Usage Per Day Years Used Total Pack Years  yes  Former smoker       HOME ENVIRONMENT/SAFETY  The Patient has fallen 2 times in the last year.  The fall(s) resulted in injury.  Details: fracture.       MEDICATIONS(added, continued or stopped this visit): Started Medication Directions Instruction Stopped   amlodipine 5 mg tablet take 1 tablet by oral route  every day     atorvastatin 80 mg tablet take 1 tablet by oral route  every day     Colace take 1 capsule by oral route 2 times every day     ICaps Take twice daily     iron take 1 tablet by oral route 2 times every day     levothyroxine 200 mcg tablet take 1 tablet by oral route  every day     multivitamin Take once daily     pantoprazole 40 mg tablet,delayed release take 1 tablet by oral route 2 times every day     Ranexa 500 mg tablet,extended release take 1 tablet by oral route 2 times every day     warfarin Take 4mg  on Tuesday and Thursday and 2mg  all other days     zinc 50 mg tablet Take twice daily       ALLERGIES: Ingredient Reaction Medication Name Comment  MEPERIDINE HCL  DEMEROL    Reviewed, updated.   REVIEW OF SYSTEMS System Neg/Pos Details  Constitutional Negative Chills, fatigue, fever, malaise, night sweats, weight gain and weight loss.  ENMT Negative Ear drainage, hearing loss, nasal drainage, otalgia, sinus pressure and sore throat.  Eyes Negative Eye discharge, eye pain and vision changes.  Respiratory Negative Chronic cough, cough, dyspnea, known TB exposure and wheezing.  Cardio Positive LVAD.  GI Negative Abdominal pain, blood in stool, change in stool pattern, constipation, decreased appetite, diarrhea, heartburn, nausea and vomiting.  GU Negative Dribbling, dysuria, erectile dysfunction, hematuria, polyuria, slow stream, urinary frequency, urinary  incontinence and urinary retention.  Endocrine Negative Cold intolerance, heat intolerance, polydipsia  and polyphagia.  Neuro Negative Dizziness, extremity weakness, gait disturbance, headache, memory impairment, numbness in extremity, seizures and tremors.  Psych Negative Anxiety, depression and insomnia.  Integumentary Negative Brittle hair, brittle nails, change in shape/size of mole(s), hair loss, hirsutism, hives, pruritus, rash and skin lesion.  MS Positive Back pain.  Hema/Lymph Negative Easy bleeding, easy bruising and lymphadenopathy.  Allergic/Immuno Negative Contact allergy, environmental allergies, food allergies and seasonal allergies.  Reproductive Negative Penile discharge and sexual dysfunction.     Vitals Date Temp F BP Pulse Ht In Wt Lb BMI BSA Pain Score  09/26/2015    72 197 26.72  6/10     PHYSICAL EXAM General Level of Distress: no acute distress Overall Appearance: normal    Cardiovascular Cardiac: distant heart sounds appreciated over the atria with a continuous smooth electrical rumble   auscultated most loudly adjacent to the LVAD  Respiratory Lungs: clear to auscultation  Neurological Recent and Remote Memory: normal Attention Span and Concentration:   normal Language: normal Fund of Knowledge: normal  Right Left Sensation: normal normal Upper Extremity Coordination: normal normal  Lower Extremity Coordination: normal normal  Musculoskeletal Gait and Station: normal  Right Left Upper Extremity Muscle Strength: normal normal Lower Extremity Muscle Strength: normal normal Upper Extremity Muscle Tone:  normal normal Lower Extremity Muscle Tone: normal normal  Motor Strength Upper and lower extremity motor strength was tested in the clinically pertinent muscles.     Deep Tendon  Reflexes  Right Left Biceps: normal normal Triceps: normal normal Brachiloradialis: normal normal Patellar: normal normal Achilles: normal normal  Sensory Sensation was tested at L1 to S1.   Cranial Nerves II. Optic Nerve/Visual Fields: normal III. Oculomotor: normal IV. Trochlear: normal V. Trigeminal: normal VI. Abducens: normal VII. Facial: normal VIII. Acoustic/Vestibular: normal IX. Glossopharyngeal: normal X. Vagus: normal XI. Spinal Accessory: normal XII. Hypoglossal: normal  Motor and other Tests Lhermittes: negative Rhomberg: negative    Right Left Hoffman's: normal normal Clonus: normal normal Babinski: normal normal SLR: negative negative Patrick's Corky Sox): negative negative Toe Walk: normal normal Toe Lift: normal normal Heel Walk: normal normal SI Joint: nontender nontender   Additional Findings:  Mechanical low back pain just above the lumbosacral junction with limitations in forward bending and extension secondary to back discomfort.    IMPRESSION Patient is on chronic anticoagulation with left ventricular assist device.  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  Completed Orders (this encounter) Order Details Reason Side Interpretation Result Initial Treatment Date Region  Lifestyle education regarding diet Encouraged to eat a well balanced diet and follow up with primary care physician.         Assessment/Plan # Detail Type Description   1. Assessment Lumbar compression fracture, closed, initial encounter (S32.000A).       2. Assessment Low back pain, unspecified back pain laterality, with sciatica presence unspecified (M54.5).       3. Assessment LVAD (left ventricular assist device) present (Z95.811).       4. Assessment Body mass index (BMI) 26.0-26.9, adult IN:[id number]).   Plan Orders Today's instructions / counseling include(s) Lifestyle education regarding diet.         Pain  Assessment/Treatment Pain Scale: 6/10. Method: Numeric Pain Intensity Scale. Location: back. Onset: 02/11/2015. Duration: varies. Quality: discomforting. Pain Assessment/Treatment follow-up plan of care: Patient is taking medications as prescribed..  Fall Risk Plan The Patient has fallen 2 times in the last year. The fall(s) resulted in injury. Details: fracture.  I recommended to patient he undergo posterior fusion L3 through L5 levels with implanted hardware and no interbody grafting.  I called Dr. Haroldine Laws, his cardiologist, to discuss the situation and my recommendations.  He felt that he could admit him and him off Coumadin and switch to heparin and that this would be very reasonable and that he would be a reasonable candidate for surgery.  He also explained that the LVAD coordinator, Jilda Roche, [phone], we'll be able to come to the operating room and assist with positioning to make sure we did not cause any problems with his ventricular assist device.  She would also be available to coordinate perioperative care and anticoagulation issues.  Orders: Diagnostic Procedures: Assessment Procedure  S32.000A Lumbar Fusion - L3 - L4 - L5 (no interbodies)  Instruction(s)/Education: Assessment Instruction  [phone] Lifestyle education regarding diet             Provider:  Marchia Meiers. Vertell Limber MD  10/02/2015 01:40 PM Dictation edited by: Marchia Meiers. Vertell Limber    CC Providers: Shaune Pascal Bensimhon [address] [place], [place] [address]-              Electronically signed by Marchia Meiers Vertell Limber MD on 10/02/2015 01:41 PM

## 2016-02-03 NOTE — Transfer of Care (Signed)
Immediate Anesthesia Transfer of Care Note  Patient: [name]  Procedure(s) Performed: Procedure(s) with comments: L3 to L5 Lumbar fusion (no interbodies) (N/A) - L3 to L5 Lumbar fusion (no interbodies)  Patient Location: PACU  Anesthesia Type:General  Level of Consciousness: awake, alert  and oriented  Airway & Oxygen Therapy: Patient Spontanous Breathing and Patient connected to nasal cannula oxygen  Post-op Assessment: Report given to RN, Post -op Vital signs reviewed and stable and Patient moving all extremities X 4  Post vital signs: Reviewed and stable  Last Vitals:  Filed Vitals:   02/02/16 2300 02/03/16 0400  BP:    Pulse: 60   Temp: 36.9 C 36.7 C  Resp: 16     Complications: No apparent anesthesia complications

## 2016-02-03 NOTE — Op Note (Signed)
02/01/2016 - 02/03/2016  10:27 AM  PATIENT:  [name]  75 y.o. 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  PROCEDURE:  Procedure(s) with comments: L3 to L5 Lumbar fusion (no interbodies) (N/A) - L3 to L5 Lumbar fusion (no interbodies) with percutaneous pedicle screw fixation and posterolateral arthrodesis with BMP, allograft  SURGEON:  Surgeon(s) and Role:    * Erline Levine, MD - Primary  PHYSICIAN ASSISTANT:   ASSISTANTS: Poteat, RN   ANESTHESIA:   general  EBL:  Total I/O In: 1435 [I.V.:600; Blood:335; IV Piggyback:500] Out: 175 [Urine:175]  BLOOD ADMINISTERED:none and 1 unit CC PRBC  DRAINS: none   LOCAL MEDICATIONS USED:  MARCAINE    and LIDOCAINE   SPECIMEN:  No Specimen  DISPOSITION OF SPECIMEN:  N/A  COUNTS:  YES  TOURNIQUET:  * No tourniquets in log *  INDICATIONS:Patient is 75 year old man with LVAD and chronic debilitating back pain due to osteonecrosis of L 4 and kyphosis at this level.  DICTATION: After the smooth and uncomplicated induction of anesthesia and placement of neural monitoring, the patient was then turned into a prone position on the [place] table on [place] table with great care to protect AICD, LVAD and drive lines from compression and using AP and lateral fluoroscopy throughout this portion of the procedure, pedicle screws were placed using Nuvasive cannulated percutaneous screws. After placing guide wires at each level with the use of nerve monitoring throughout, Reline towers were placed, 2 at L 3 7.5 x 45 mm bilaterally, 2 at L 5 of the same size. 75 mm rods were then affixed to the screw heads and locked down on the screws. All connections were then torqued and the Towers were disassembled. The wounds were irrigated and then closed with 1,  2-0 and 3-0 Vicryl stitches. A midline incision was then made exposing posterior elements from L 3 - L 5 levels and facet joints and laminae were decorticated, then packed with small BMP, 40 cc allograft with 10 cc DBM.  Fascial incision was closed with 1 vicryl sutures, skin and subcutaneous tissues were closed with  2-0 and 3-0 Vicryl stitches.  Sterile occlusive dressing was placed with Dermabond. The patient was then extubated in the operating room and taken to recovery in stable and satisfactory condition having tolerated his operation well. Counts were correct at the end of the case.  PLAN OF CARE: Admit to inpatient   PATIENT DISPOSITION:  PACU - hemodynamically stable.   Delay start of Pharmacological VTE agent (>24hrs) due to surgical blood loss or risk of bleeding: yes

## 2016-02-03 NOTE — Progress Notes (Signed)
VAD Coordinator Procedure Note:   VAD coordinator met 2W nurse in holding room; report given.  Patient underwent lumbar fusion with percutaneous screw fixation.. Hemodynamics and VAD parameters monitored by anesthesia and myself throughout the procedure. MAPs were obtained with arterial line.       A-line MAP: HR: Sat: Flow: PI: Power:     Speed:              Pre-procedure: 7:15 am    60 98 3.6 7.7 4.6       9000  ICD therapy off per [employer] 7:55   80  59 99 4.0 7.5 4.7   Sedation Induction: 8:00   82  60 100 3.6 6.8 4.5 8:10   74  60 100 4.4 6.0 5.0 8:25   71  60 99 4.0 4.5 4.7                     Prone position 8:30   73  59 99 3.9 2.8 4.8 8:35   67  59 96 4.4 5.4 4.9 8:45   63  79 100 4.4 4.5 4.9 9:00   63  75 100 4.7 4.7 5.1 9:15   62  59 100 4.6 5.7 5.0 9:30   64  60 100 4.7 5.2 5.1 10:00   65  64 100 4.9 6.2 5.2  PACU 10:20   56  64 98 5.6 4.7 5.5 10:30   62  65 100 4.9 4.8 5.2     ICD therapy back on per [employer] 10:45   63  62 100 5.0 5.9 5.1 11:00   70  58 96 4.4 6.2 4.9 11:30   84  48 100 4.4 6.8 4.9     Patient tolerated the procedure well. Report to VAD trained nurse in PACU   Patient Disposition: 2H02 per PACU team

## 2016-02-03 NOTE — Brief Op Note (Signed)
02/01/2016 - 02/03/2016  10:27 AM  PATIENT:  [name]  75 y.o. 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  PROCEDURE:  Procedure(s) with comments: L3 to L5 Lumbar fusion (no interbodies) (N/A) - L3 to L5 Lumbar fusion (no interbodies) with percutaneous pedicle screw fixation and posterolateral arthrodesis with BMP, allograft  SURGEON:  Surgeon(s) and Role:    * Erline Levine, MD - Primary  PHYSICIAN ASSISTANT:   ASSISTANTS: Poteat, RN   ANESTHESIA:   general  EBL:  Total I/O In: 1435 [I.V.:600; Blood:335; IV Piggyback:500] Out: 175 [Urine:175]  BLOOD ADMINISTERED:none and 1 unit CC PRBC  DRAINS: none   LOCAL MEDICATIONS USED:  MARCAINE    and LIDOCAINE   SPECIMEN:  No Specimen  DISPOSITION OF SPECIMEN:  N/A  COUNTS:  YES  TOURNIQUET:  * No tourniquets in log *  INDICATIONS:Patient is 75 year old man with LVAD and chronic debilitating back pain due to osteonecrosis of L 4 and kyphosis at this level.  DICTATION: After the smooth and uncomplicated induction of anesthesia and placement of neural monitoring, the patient was then turned into a prone position on the [place] table on [place] table with great care to protect AICD, LVAD and drive lines from compression and using AP and lateral fluoroscopy throughout this portion of the procedure, pedicle screws were placed using Nuvasive cannulated percutaneous screws. After placing guide wires at each level with the use of nerve monitoring throughout, Reline towers were placed, 2 at L 3 7.5 x 45 mm bilaterally, 2 at L 5 of the same size. 75 mm rods were then affixed to the screw heads and locked down on the screws. All connections were then torqued and the Towers were disassembled. The wounds were irrigated and then closed with 1,  2-0 and 3-0 Vicryl stitches. A midline incision was then made exposing posterior elements from L 3 - L 5 levels and facet joints and laminae were decorticated, then packed with small BMP, 40 cc allograft with 10 cc DBM.  Fascial incision was closed with 1 vicryl sutures, skin and subcutaneous tissues were closed with  2-0 and 3-0 Vicryl stitches.  Sterile occlusive dressing was placed with Dermabond. The patient was then extubated in the operating room and taken to recovery in stable and satisfactory condition having tolerated his operation well. Counts were correct at the end of the case.  PLAN OF CARE: Admit to inpatient   PATIENT DISPOSITION:  PACU - hemodynamically stable.   Delay start of Pharmacological VTE agent (>24hrs) due to surgical blood loss or risk of bleeding: yes

## 2016-02-03 NOTE — Anesthesia Preprocedure Evaluation (Addendum)
Anesthesia Evaluation  Patient identified by MRN, date of birth, ID band Patient awake    Reviewed: Allergy & Precautions, NPO status , Patient's Chart, lab work & pertinent test results, reviewed documented beta blocker date and time   History of Anesthesia Complications Negative for: history of anesthetic complications  Airway Mallampati: I  TM Distance: >3 FB Neck ROM: Full    Dental  (+) Dental Advisory Given   Pulmonary shortness of breath, sleep apnea , former smoker,    breath sounds clear to auscultation       Cardiovascular hypertension, Pt. on medications and Pt. on home beta blockers + CAD, + Past MI, + Peripheral Vascular Disease and +CHF  + dysrhythmias + Cardiac Defibrillator  Rhythm:Regular  LVAD   Neuro/Psych CVA, No Residual Symptoms negative psych ROS   GI/Hepatic negative GI ROS,   Endo/Other  Hypothyroidism   Renal/GU Renal InsufficiencyRenal disease     Musculoskeletal  (+) Arthritis ,   Abdominal   Peds  Hematology  (+) anemia , H/H 10.1/31.8   Anesthesia Other Findings   Reproductive/Obstetrics                            Anesthesia Physical Anesthesia Plan  ASA: IV  Anesthesia Plan: General   Post-op Pain Management:    Induction: Intravenous  Airway Management Planned: Oral ETT  Additional Equipment: Arterial line and Ultrasound Guidance Line Placement  Intra-op Plan:   Post-operative Plan: Extubation in OR and Possible Post-op intubation/ventilation  Informed Consent: I have reviewed the patients History and Physical, chart, labs and discussed the procedure including the risks, benefits and alternatives for the proposed anesthesia with the patient or authorized representative who has indicated his/her understanding and acceptance.   Dental advisory given  Plan Discussed with: Anesthesiologist, Surgeon and CRNA  Anesthesia Plan Comments:         Anesthesia Quick Evaluation

## 2016-02-03 NOTE — Progress Notes (Signed)
ANTICOAGULATION CONSULT NOTE - Follow Up Consult  Pharmacy Consult for Heparin and Coumadin (both on hold) Indication: LVAD  Allergies  Allergen Reactions  . Demerol [Meperidine] Other (See Comments)    Paralysis. Could only move eyes.    Patient Measurements: Weight: 182 lb 4.8 oz (82.691 kg)   Vital Signs: Temp: 98.4 F (36.9 C) (03/31 1030) Temp Source: Oral (03/31 0400) Pulse Rate: 60 (03/31 1115)  Labs:  Recent Labs  02/01/16 1646  02/02/16 0420 02/02/16 1533 02/03/16 0340 02/03/16 0914  HGB  --   < > 10.2*  --  10.1* 10.9*  HCT  --   --  32.4*  --  31.8* 32.0*  PLT  --   --  191  --  176  --   LABPROT 20.6*  --  20.7* 20.8* 19.3*  --   INR 1.77*  --  1.79* 1.80* 1.63*  --   HEPARINUNFRC  --   --  <0.10* 0.62 0.54  --   CREATININE 1.47*  --   --   --   --   --   < > = values in this interval not displayed.  Estimated Creatinine Clearance: 46.9 mL/min (by C-G formula based on Cr of 1.47).  Assessment: 74yom on coumadin pta for his LVAD, admitted for elective back surgery. Coumadin held and he was bridged with IV heparin. He is now s/p surgery. Per Drs. VanTrigt and Bensimhon, wait until tomorrow after rounds to restart heparin and coumadin.  Previously therapeutic on heparin at 1150 units/hr. INR 1.6 - he received 1 unit FFP yesterday prior to surgery.  Home coumadin dose: 2mg  daily except 4mg  on Monday  Goal of Therapy:  INR 2-3 Heparin level 0.3-0.7 units/ml Monitor platelets by anticoagulation protocol: Yes   Plan:  1) Follow up resuming heparin and coumadin 4/1  Deboraha Sprang 02/03/2016,11:59 AM

## 2016-02-03 NOTE — Progress Notes (Signed)
Awake, alert, conversant.  MAEW with good strength.  Minimal back pain.  Doing well.  No CV issues.

## 2016-02-03 NOTE — Interval H&P Note (Signed)
History and Physical Interval Note:  02/03/2016 7:27 AM  [name]  has presented today for surgery, with the diagnosis of Lumbar compression fracture  The various methods of treatment have been discussed with the patient and family. After consideration of risks, benefits and other options for treatment, the patient has consented to  Procedure(s) with comments: L3 to L5 Lumbar fusion (no interbodies) (N/A) - L3 to L5 Lumbar fusion (no interbodies) as a surgical intervention .  The patient's history has been reviewed, patient examined, no change in status, stable for surgery.  I have reviewed the patient's chart and labs.  Questions were answered to the patient's satisfaction.     Kerrianne Jeng D

## 2016-02-04 ENCOUNTER — Inpatient Hospital Stay (HOSPITAL_COMMUNITY): Payer: Medicare Other

## 2016-02-04 DIAGNOSIS — R791 Abnormal coagulation profile: Secondary | ICD-10-CM

## 2016-02-04 LAB — BASIC METABOLIC PANEL
ANION GAP: 7 (ref 5–15)
BUN: 16 mg/dL (ref 6–20)
CALCIUM: 8.9 mg/dL (ref 8.9–10.3)
CO2: 22 mmol/L (ref 22–32)
Chloride: 107 mmol/L (ref 101–111)
Creatinine, Ser: 1.17 mg/dL (ref 0.61–1.24)
GFR calc Af Amer: >60 mL/min (ref >60)
GFR calc non Af Amer: 59 mL/min — ABNORMAL LOW (ref >60)
GLUCOSE: 120 mg/dL — AB (ref 65–99)
Potassium: 4.7 mmol/L (ref 3.5–5.1)
Sodium: 136 mmol/L (ref 135–145)

## 2016-02-04 LAB — CBC
HEMATOCRIT: 30.3 % — AB (ref 39.0–52.0)
Hemoglobin: 9.8 g/dL — ABNORMAL LOW (ref 13.0–17.0)
MCH: 29 pg (ref 26.0–34.0)
MCHC: 32.3 g/dL (ref 30.0–36.0)
MCV: 89.6 fL (ref 78.0–100.0)
Platelets: 131 10*3/uL — ABNORMAL LOW (ref 150–400)
RBC: 3.38 MIL/uL — ABNORMAL LOW (ref 4.22–5.81)
RDW: 17 % — AB (ref 11.5–15.5)
WBC: 10.2 10*3/uL (ref 4.0–10.5)

## 2016-02-04 LAB — LACTATE DEHYDROGENASE: LDH: 260 U/L — ABNORMAL HIGH (ref 98–192)

## 2016-02-04 LAB — HEPARIN LEVEL (UNFRACTIONATED): Heparin Unfractionated: 0.23 IU/mL — ABNORMAL LOW (ref 0.30–0.70)

## 2016-02-04 LAB — PROTIME-INR
INR: 1.42 (ref 0.00–1.49)
Prothrombin Time: 17.5 seconds — ABNORMAL HIGH (ref 11.6–15.2)

## 2016-02-04 MED ORDER — HEPARIN (PORCINE) IN NACL 100-0.45 UNIT/ML-% IJ SOLN
1300.0000 [IU]/h | INTRAMUSCULAR | Status: DC
  Administered 2016-02-05: 1250 [IU]/h via INTRAVENOUS
  Filled 2016-02-04 (×5): qty 250

## 2016-02-04 MED ORDER — WARFARIN SODIUM 2 MG PO TABS
2.0000 mg | ORAL_TABLET | Freq: Once | ORAL | Status: AC
  Administered 2016-02-04: 2 mg via ORAL
  Filled 2016-02-04: qty 1

## 2016-02-04 MED ORDER — WARFARIN - PHARMACIST DOSING INPATIENT
Freq: Every day | Status: DC
  Administered 2016-02-06 – 2016-02-08 (×3)

## 2016-02-04 MED ORDER — FUROSEMIDE 10 MG/ML IJ SOLN
20.0000 mg | Freq: Once | INTRAMUSCULAR | Status: AC
  Administered 2016-02-04: 20 mg via INTRAVENOUS
  Filled 2016-02-04: qty 2

## 2016-02-04 NOTE — Progress Notes (Signed)
HeartMate 2 Rounding Note  Subjective:    S/p L3 to L5 Fusion 02/03/16  Off levophed. Says he feels fine after receiving dilaudid. Back was sore this am. No SOB. Bowels regular   INR 1.42 this am. Heparin on hold currently s/p surgery.    LVAD INTERROGATION:  HeartMate II LVAD:  Flow 4.4 liters/min, speed 9000, power 5.0, PI 5.6  Objective:    Vital Signs:   Temp:  [98.4 F (36.9 C)-99.5 F (37.5 C)] 98.5 F (36.9 C) (04/01 0730) Pulse Rate:  [30-66] 60 (04/01 0800) Resp:  [10-19] 13 (04/01 0800) BP: (83-113)/(66-86) 83/66 mmHg (04/01 0800) SpO2:  [87 %-100 %] 93 % (04/01 0800) Arterial Line BP: (64-113)/(58-86) 103/76 mmHg (04/01 0800) Weight:  [82.6 kg (182 lb 1.6 oz)-86.6 kg (190 lb 14.7 oz)] 86.6 kg (190 lb 14.7 oz) (04/01 0453) Last BM Date: 02/02/16 Mean arterial Pressure 80-90s   Intake/Output:   Intake/Output Summary (Last 24 hours) at 02/04/16 0931 Last data filed at 02/04/16 0615  Gross per 24 hour  Intake 1977.88 ml  Output   2275 ml  Net -297.12 ml     Physical Exam: General:lying in bed . NAD HEENT: normal Neck: supple. RIJ TLC. JVP 10 cm. R CEA site C/D/I Carotids 2+ bilat; no bruits. No thyromegaly or nodule noted. Cor: Mechanical heart sounds with LVAD hum present. Lungs: CTAB, normal effort Abdomen: soft, NT, ND, no HSM. No bruits or masses. +BS Driveline: C/D/I; securement device intact and driveline incorporated Extremities: no cyanosis, clubbing, rash, tr edema Neuro: alert & orientedx3, cranial nerves grossly intact. moves all 4 extremities w/o difficulty. Affect pleasant  Telemetry: Reviewed, A pacing 50-60s  Labs: Basic Metabolic Panel:  Recent Labs Lab 02/01/16 1646 02/03/16 0914 02/04/16 0506  NA 136 138 136  K 4.2 4.1 4.7  CL 105  --  107  CO2 22  --  22  GLUCOSE 121*  --  120*  BUN 22*  --  16  CREATININE 1.47*  --  1.17  CALCIUM 8.8*  --  8.9    Liver Function Tests:  Recent Labs Lab 02/01/16 1646  AST 30  ALT  19  ALKPHOS 123  BILITOT 0.6  PROT 6.4*  ALBUMIN 3.3*   No results for input(s): LIPASE, AMYLASE in the last 168 hours. No results for input(s): AMMONIA in the last 168 hours.  CBC:  Recent Labs Lab 02/02/16 0420 02/03/16 0340 02/03/16 0914 02/03/16 1840 02/04/16 0506  WBC 6.9 6.2  --  8.2 10.2  NEUTROABS  --   --   --  7.2  --   HGB 10.2* 10.1* 10.9* 9.7* 9.8*  HCT 32.4* 31.8* 32.0* 29.9* 30.3*  MCV 90.3 90.1  --  89.3 89.6  PLT 191 176  --  136* 131*    INR:  Recent Labs Lab 02/01/16 1646 02/02/16 0420 02/02/16 1533 02/03/16 0340 02/04/16 0506  INR 1.77* 1.79* 1.80* 1.63* 1.42    Other results:    Imaging: Dg Lumbar Spine 2-3 Views  02/03/2016  CLINICAL DATA:  Lumbar surgery. EXAM: LUMBAR SPINE - 2-3 VIEW; DG C-ARM 61-120 MIN COMPARISON:  12/12/2015 FINDINGS: Lumbar vertebra numbered with the lowest segmented appearing vertebral lateral view as L5 . L3 through L5 posterior fusion. Hardware intact. L4 compression fracture noted. IMPRESSION: L3-L5 posterior fusion.  Hardware intact.  Good anatomic alignment. 2. L4 compression fracture.  Stable from 12/12/2015. Electronically Signed   By: Marcello Moores  Register   On: 02/03/2016 09:54  Dg Chest Port 1 View  02/04/2016  CLINICAL DATA:  Left ventricular assist device. EXAM: PORTABLE CHEST 1 VIEW COMPARISON:  January 17, 2016. FINDINGS: Stable cardiomegaly. Left ventricular assist device again noted and unchanged in position. Left-sided pacemaker is unchanged. Interval placement of right internal jugular catheter with distal tip in expected position of the SVC. No pneumothorax is noted. Minimal left pleural effusion is noted. Mild left basilar subsegmental atelectasis is stable. Right lung is clear. Bony thorax is unremarkable. IMPRESSION: Stable position of left ventricular assist device. Stable mild left basilar subsegmental listhesis with minimal pleural effusion is noted. Electronically Signed   By: Marijo Conception, M.D.   On:  02/04/2016 07:26   Dg C-arm 1-60 Min  02/03/2016  CLINICAL DATA:  Lumbar surgery. EXAM: LUMBAR SPINE - 2-3 VIEW; DG C-ARM 61-120 MIN COMPARISON:  12/12/2015 FINDINGS: Lumbar vertebra numbered with the lowest segmented appearing vertebral lateral view as L5 . L3 through L5 posterior fusion. Hardware intact. L4 compression fracture noted. IMPRESSION: L3-L5 posterior fusion.  Hardware intact.  Good anatomic alignment. 2. L4 compression fracture.  Stable from 12/12/2015. Electronically Signed   By: Marcello Moores  Register   On: 02/03/2016 09:54     Medications:     Scheduled Medications: . sodium chloride   Intravenous Once  . amiodarone  200 mg Oral Daily  . amLODipine  10 mg Oral QHS  . atorvastatin  80 mg Oral Daily  . docusate sodium  100 mg Oral BID  . ferrous gluconate  324 mg Oral BID WC  . guaiFENesin  600 mg Oral BID  . levothyroxine  200-300 mcg Oral Once per day on Sun Tue Wed Thu Fri Sat  . [START ON 02/06/2016] levothyroxine  300 mcg Oral Once per day on Mon  . metoprolol succinate  25 mg Oral BID  . pantoprazole  40 mg Oral BID  . ranolazine  500 mg Oral BID  . sodium chloride flush  3 mL Intravenous Q12H    Infusions: . sodium chloride    . dextrose 5 % and 0.45 % NaCl with KCl 20 mEq/L 75 mL/hr at 02/04/16 0431  . heparin    . norepinephrine (LEVOPHED) Adult infusion Stopped (02/03/16 1547)    PRN Medications: acetaminophen **OR** acetaminophen, alum & mag hydroxide-simeth, bisacodyl, docusate sodium, furosemide, HYDROcodone-acetaminophen, HYDROmorphone (DILAUDID) injection, menthol-cetylpyridinium **OR** phenol, methocarbamol **OR** methocarbamol (ROBAXIN)  IV, nitroGLYCERIN, ondansetron (ZOFRAN) IV, oxyCODONE-acetaminophen, polyethylene glycol, promethazine, sodium chloride flush, sodium phosphate   Assessment:   1. Debilitating back pain due to lumbar compression fracture - Major limiting factor of his functional status    --S/p L3 to L5 Fusion 02/03/16 2. 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 PAF/AFL with RVR s/p TEEdccv on 01/19/16 4. CKD stage III 5. 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 Now on Plavix and warfarin at home.  6. High-grade R internal carotid stenosis - s/p R CEA 01/13/16 - Had f/u 3/28. Stable.   Plan/Discussion:    Doing well post-op. MAPs improved and off pressors.  Maintaining NSR. Will restart heparin and coumadin today. Pain management currently adequate. PT to see. Appreciate Dr. Melven Sartorius care tremendously.  VAD parameters ok. Volume status up. Will give lasix 20 IV. Can go to SDU.   I reviewed the LVAD parameters from today, and compared the results to the patient's prior recorded data.  No programming changes were made.  The LVAD is functioning within specified parameters.  The patient performs  LVAD self-test daily.  LVAD interrogation was negative for any significant power changes, alarms or PI events/speed drops.  LVAD equipment check completed and is in good working order.  Back-up equipment present.   LVAD education done on emergency procedures and precautions and reviewed exit site care.  Length of Stay: 3  Bensimhon, Daniel 02/04/2016, 9:31 AM  VAD Team --- VAD ISSUES ONLY--- Pager [phone] (7am - 7am)  Advanced Heart Failure Team  Pager [phone] (M-F; 7a - 4p)  Please contact [facility] for night-coverage after hours (4p -7a ) and weekends on [email or web address]

## 2016-02-04 NOTE — Progress Notes (Signed)
ANTICOAGULATION CONSULT NOTE - Follow Up Consult  Pharmacy Consult for heparin Indication: LVAD  Allergies  Allergen Reactions  . Demerol [Meperidine] Other (See Comments)    Paralysis. Could only move eyes.    Patient Measurements: Height: 6' (182.9 cm) Weight: 190 lb 14.7 oz (86.6 kg) IBW/kg (Calculated) : 77.6  Vital Signs: Temp: 101.4 F (38.6 C) (04/01 2000) Temp Source: Oral (04/01 2000) Pulse Rate: 65 (04/01 1600)  Labs:  Recent Labs  02/02/16 1533 02/03/16 0340 02/03/16 0914 02/03/16 1840 02/04/16 0506 02/04/16 2017  HGB  --  10.1* 10.9* 9.7* 9.8*  --   HCT  --  31.8* 32.0* 29.9* 30.3*  --   PLT  --  176  --  136* 131*  --   LABPROT 20.8* 19.3*  --   --  17.5*  --   INR 1.80* 1.63*  --   --  1.42  --   HEPARINUNFRC 0.62 0.54  --   --   --  0.23*  CREATININE  --   --   --   --  1.17  --     Estimated Creatinine Clearance: 59 mL/min (by C-G formula based on Cr of 1.17).  Assessment: 74yom on coumadin pta for his LVAD, admitted for elective back surgery. Coumadin held and he was bridged with IV heparin. He is now s/p surgery and restarted on IV heparin. Level is subtherapeutic. No bleeding or problems noted.    Goal of Therapy:  INR 2-3, HL 0.3 - 0.7 Monitor platelets by anticoagulation protocol: Yes   Plan:  - Increase heparin gtt to 1250 units/hr - Check an 8 hour heparin level  Salome Arnt, PharmD, BCPS Pager # [phone] 02/04/2016 8:54 PM

## 2016-02-04 NOTE — Progress Notes (Signed)
ANTICOAGULATION CONSULT NOTE - Follow Up Consult  Pharmacy Consult for heparin/warfarin Indication: LVAD  Allergies  Allergen Reactions  . Demerol [Meperidine] Other (See Comments)    Paralysis. Could only move eyes.    Patient Measurements: Height: 6' (182.9 cm) Weight: 190 lb 14.7 oz (86.6 kg) IBW/kg (Calculated) : 77.6  Vital Signs: Temp: 98.5 F (36.9 C) (04/01 0730) Temp Source: Axillary (04/01 0730) BP: 83/66 mmHg (04/01 0800) Pulse Rate: 60 (04/01 0800)  Labs:  Recent Labs  02/01/16 1646  02/02/16 0420 02/02/16 1533 02/03/16 0340 02/03/16 0914 02/03/16 1840 02/04/16 0506  HGB  --   < > 10.2*  --  10.1* 10.9* 9.7* 9.8*  HCT  --   < > 32.4*  --  31.8* 32.0* 29.9* 30.3*  PLT  --   < > 191  --  176  --  136* 131*  LABPROT 20.6*  --  20.7* 20.8* 19.3*  --   --  17.5*  INR 1.77*  --  1.79* 1.80* 1.63*  --   --  1.42  HEPARINUNFRC  --   --  <0.10* 0.62 0.54  --   --   --   CREATININE 1.47*  --   --   --   --   --   --  1.17  < > = values in this interval not displayed.  Estimated Creatinine Clearance: 59 mL/min (by C-G formula based on Cr of 1.17).  Assessment:  74yom on coumadin pta for his LVAD, admitted for elective back surgery. Coumadin held and he was bridged with IV heparin. He is now s/p surgery. Per MD pt to restart heparin and warfarin today. Current INR 1.42, hgb 9.8, plts 131.   Goal of Therapy:  INR 2-3, HL 0.3 - 0.7 Monitor platelets by anticoagulation protocol: Yes   Plan:  -Heparin gtt 1150 units/hr -Warfarin 2 mg po x 1 tonight -8 hr HL -Daily HL, INR, CBC -Monitor for S&S of bleed  Angela Burke, PharmD Pharmacy Resident Pager: [phone] 02/04/2016,9:54 AM

## 2016-02-04 NOTE — Progress Notes (Signed)
Subjective: Patient reports doing well.  A bit sore in back  Objective: Vital signs in last 24 hours: Temp:  [98.4 F (36.9 C)-99.5 F (37.5 C)] 98.9 F (37.2 C) (04/01 0400) Pulse Rate:  [30-66] 61 (04/01 0600) Resp:  [10-19] 12 (04/01 0600) BP: (96-113)/(76-86) 113/86 mmHg (04/01 0400) SpO2:  [87 %-100 %] 93 % (04/01 0600) Arterial Line BP: (64-113)/(58-86) 97/73 mmHg (04/01 0600) Weight:  [82.6 kg (182 lb 1.6 oz)-86.6 kg (190 lb 14.7 oz)] 86.6 kg (190 lb 14.7 oz) (04/01 0453)  Intake/Output from previous day: 03/31 0701 - 04/01 0700 In: 2812.9 [I.V.:1877.9; Blood:335; IV [id number] Out: [id number] [Urine:2400; Blood:50] Intake/Output this shift: Total I/O In: 900 [I.V.:900] Out: 1300 [Urine:1300]  Physical Exam: Strength full in legs, mild incisional soreness.  No hematoma, minimal spotting on bandage.  Lab Results:  Recent Labs  02/03/16 1840 02/04/16 0506  WBC 8.2 10.2  HGB 9.7* 9.8*  HCT 29.9* 30.3*  PLT 136* 131*   BMET  Recent Labs  02/01/16 1646 02/03/16 0914 02/04/16 0506  NA 136 138 136  K 4.2 4.1 4.7  CL 105  --  107  CO2 22  --  22  GLUCOSE 121*  --  120*  BUN 22*  --  16  CREATININE 1.47*  --  1.17  CALCIUM 8.8*  --  8.9    Studies/Results: Dg Lumbar Spine 2-3 Views  02/03/2016  CLINICAL DATA:  Lumbar surgery. EXAM: LUMBAR SPINE - 2-3 VIEW; DG C-ARM 61-120 MIN COMPARISON:  12/12/2015 FINDINGS: Lumbar vertebra numbered with the lowest segmented appearing vertebral lateral view as L5 . L3 through L5 posterior fusion. Hardware intact. L4 compression fracture noted. IMPRESSION: L3-L5 posterior fusion.  Hardware intact.  Good anatomic alignment. 2. L4 compression fracture.  Stable from 12/12/2015. Electronically Signed   By: Marcello Moores  Register   On: 02/03/2016 09:54   Dg C-arm 1-60 Min  02/03/2016  CLINICAL DATA:  Lumbar surgery. EXAM: LUMBAR SPINE - 2-3 VIEW; DG C-ARM 61-120 MIN COMPARISON:  12/12/2015 FINDINGS: Lumbar vertebra numbered with the  lowest segmented appearing vertebral lateral view as L5 . L3 through L5 posterior fusion. Hardware intact. L4 compression fracture noted. IMPRESSION: L3-L5 posterior fusion.  Hardware intact.  Good anatomic alignment. 2. L4 compression fracture.  Stable from 12/12/2015. Electronically Signed   By: Marcello Moores  Register   On: 02/03/2016 09:54    Assessment/Plan: Mobilize.  Cannot wear a brace due to LVAD, but needs to act as if he is wearing one and avoid bending, twisting.  Back precautions.  OK to start heparin this morning.    LOS: 3 days    Peggyann Shoals, MD 02/04/2016, 6:54 AM

## 2016-02-04 NOTE — Evaluation (Signed)
Physical Therapy Evaluation Patient Details Name: [name] MRN: [id number] DOB: 1941/10/13 Today's Date: 02/04/2016   History of Present Illness  Patient is a 75 yo male admitted 02/01/16 with L4 compression fracture, osteonecrosis, and intractable back pain.  Patient now s/p L3-5 fusion.   PMH:  CHF, LVAD 2014, PAD, ICM, s/p Rt CEA, Flutter with RVR s/p DCCV, CVA, PAF, CAD, MI, HTN, CKD, PVD, HLD   Clinical Impression  Patient presents with problems listed below.  Will benefit from acute PT to maximize functional mobility prior to discharge home with wife.  Patient should progress well with mobility.  Do not anticipate any f/u PT needs at d/c.    Follow Up Recommendations No PT follow up;Supervision - Intermittent    Equipment Recommendations  None recommended by PT    Recommendations for Other Services       Precautions / Restrictions Precautions Precautions: Back;Fall Precaution Comments: Patient has had multiple falls at home.  Reviewed back precautions with patient. Required Braces or Orthoses:  (Unable to use back brace due to LVAD) Restrictions Weight Bearing Restrictions: No      Mobility  Bed Mobility Overal bed mobility: Needs Assistance Bed Mobility: Rolling;Sidelying to Sit Rolling: Min guard Sidelying to sit: Min assist       General bed mobility comments: Verbal cues for log rolling and moving to sitting position while maintaining back precautions.  Assist to raise trunk to upright sitting.  Patient able to change to batteries for mobility.  Patient became nauseated while sitting EOB.  Subsided after several minutes.  RN notified  Transfers Overall transfer level: Needs assistance Equipment used: 4-wheeled walker Transfers: Sit to/from Stand Sit to Stand: Min assist;+2 safety/equipment         General transfer comment: Verbal cues for technique.  Assist to power up to standing, and for balance initially.  Ambulation/Gait Ambulation/Gait  assistance: Min assist;+2 safety/equipment Ambulation Distance (Feet): 15 Feet Assistive device: Rolling walker (2 wheeled) Gait Pattern/deviations: Step-through pattern;Decreased stride length;Shuffle Gait velocity: decreased Gait velocity interpretation: Below normal speed for age/gender General Gait Details: Verbal cues for safe use of RW, and to maintain precautions during gait.  Slow, slightly unsteady gait.  Assist to control descent into chair.  Stairs            Wheelchair Mobility    Modified Rankin (Stroke Patients Only)       Balance                                             Pertinent Vitals/Pain Pain Assessment: 0-10 Pain Score: 4  Pain Location: Back Pain Descriptors / Indicators: Aching;Sore Pain Intervention(s): Limited activity within patient's tolerance;Monitored during session;Repositioned;Premedicated before session    [place] expects to be discharged to:: Private residence Living Arrangements: Spouse/significant other Available Help at Discharge: Family;Available PRN/intermittently (Wife works part-time) Type of Home: [employer] Access: Stairs to enter Entrance Stairs-Rails: None Technical brewer of Steps: 1 [place]: Two level;Able to live on main level with bedroom/bathroom Home Equipment: Gilford Rile - 2 wheels;Walker - 4 wheels;Cane - single point;Bedside commode;Shower seat;Grab bars - tub/shower;Toilet riser      Prior Function Level of Independence: Independent         Comments: Reports he drives short distances     Hand Dominance        Extremity/Trunk Assessment   Upper  Extremity Assessment: Overall WFL for tasks assessed           Lower Extremity Assessment: Generalized weakness         Communication   Communication: No difficulties  Cognition Arousal/Alertness: Awake/alert Behavior During Therapy: WFL for tasks assessed/performed Overall Cognitive Status: Within  Functional Limits for tasks assessed                      General Comments      Exercises        Assessment/Plan    PT Assessment Patient needs continued PT services  PT Diagnosis Difficulty walking;Generalized weakness;Acute pain   PT Problem List Decreased strength;Decreased activity tolerance;Decreased balance;Decreased mobility;Decreased knowledge of use of DME;Decreased knowledge of precautions;Cardiopulmonary status limiting activity;Pain  PT Treatment Interventions DME instruction;Gait training;Functional mobility training;Therapeutic activities;Patient/family education   PT Goals (Current goals can be found in the Care Plan section) Acute Rehab PT Goals Patient Stated Goal: To decrease pain PT Goal Formulation: With patient/family Time For Goal Achievement: 02/11/16 Potential to Achieve Goals: Good    Frequency Min 5X/week   Barriers to discharge        Co-evaluation               End of Session Equipment Utilized During Treatment: Oxygen Activity Tolerance: Patient limited by fatigue;Patient limited by pain (Limited by nausea) Patient left: in chair;with call bell/phone within reach;with family/visitor present Nurse Communication: Mobility status         Time: [id number] PT Time Calculation (min) (ACUTE ONLY): 28 min   Charges:   PT Evaluation $PT Eval High Complexity: 1 Procedure PT Treatments $Gait Training: 8-22 mins   PT G Codes:        Despina Pole Feb 21, 2016, 12:30 PM Carita Pian. Sanjuana Kava, [place] Pager [phone]

## 2016-02-05 DIAGNOSIS — R112 Nausea with vomiting, unspecified: Secondary | ICD-10-CM

## 2016-02-05 LAB — URINALYSIS, ROUTINE W REFLEX MICROSCOPIC
Bilirubin Urine: NEGATIVE
GLUCOSE, UA: NEGATIVE mg/dL
KETONES UR: NEGATIVE mg/dL
LEUKOCYTES UA: NEGATIVE
Nitrite: NEGATIVE
PH: 5 (ref 5.0–8.0)
Protein, ur: NEGATIVE mg/dL
Specific Gravity, Urine: 1.023 (ref 1.005–1.030)

## 2016-02-05 LAB — HEPARIN LEVEL (UNFRACTIONATED)
HEPARIN UNFRACTIONATED: 0.33 [IU]/mL (ref 0.30–0.70)
Heparin Unfractionated: 0.3 IU/mL (ref 0.30–0.70)

## 2016-02-05 LAB — CBC
HCT: 29.4 % — ABNORMAL LOW (ref 39.0–52.0)
HEMATOCRIT: 28.5 % — AB (ref 39.0–52.0)
HEMOGLOBIN: 9.4 g/dL — AB (ref 13.0–17.0)
HEMOGLOBIN: 9.1 g/dL — AB (ref 13.0–17.0)
MCH: 30.2 pg (ref 26.0–34.0)
MCH: 28.4 pg (ref 26.0–34.0)
MCHC: 33 g/dL (ref 30.0–36.0)
MCHC: 31 g/dL (ref 30.0–36.0)
MCV: 91.6 fL (ref 78.0–100.0)
MCV: 91.9 fL (ref 78.0–100.0)
PLATELETS: 129 10*3/uL — AB (ref 150–400)
Platelets: 123 10*3/uL — ABNORMAL LOW (ref 150–400)
RBC: 3.11 MIL/uL — AB (ref 4.22–5.81)
RBC: 3.2 MIL/uL — AB (ref 4.22–5.81)
RDW: 17.8 % — ABNORMAL HIGH (ref 11.5–15.5)
RDW: 17.7 % — ABNORMAL HIGH (ref 11.5–15.5)
WBC: 9 10*3/uL (ref 4.0–10.5)
WBC: 8.9 10*3/uL (ref 4.0–10.5)

## 2016-02-05 LAB — BASIC METABOLIC PANEL
Anion gap: 6 (ref 5–15)
BUN: 19 mg/dL (ref 6–20)
CALCIUM: 8.6 mg/dL — AB (ref 8.9–10.3)
CO2: 25 mmol/L (ref 22–32)
Chloride: 104 mmol/L (ref 101–111)
Creatinine, Ser: 1.41 mg/dL — ABNORMAL HIGH (ref 0.61–1.24)
GFR calc Af Amer: 54 mL/min — ABNORMAL LOW (ref >60)
GFR, EST NON AFRICAN AMERICAN: 47 mL/min — AB (ref >60)
GLUCOSE: 107 mg/dL — AB (ref 65–99)
POTASSIUM: 4.3 mmol/L (ref 3.5–5.1)
Sodium: 135 mmol/L (ref 135–145)

## 2016-02-05 LAB — URINE MICROSCOPIC-ADD ON

## 2016-02-05 LAB — LACTATE DEHYDROGENASE: LDH: 223 U/L — AB (ref 98–192)

## 2016-02-05 LAB — PROTIME-INR
INR: 1.65 — AB (ref 0.00–1.49)
PROTHROMBIN TIME: 19.5 s — AB (ref 11.6–15.2)

## 2016-02-05 MED ORDER — TRAMADOL HCL 50 MG PO TABS
50.0000 mg | ORAL_TABLET | Freq: Three times a day (TID) | ORAL | Status: DC
  Administered 2016-02-05 – 2016-02-09 (×11): 50 mg via ORAL
  Filled 2016-02-05 (×11): qty 1

## 2016-02-05 MED ORDER — CETYLPYRIDINIUM CHLORIDE 0.05 % MT LIQD
7.0000 mL | Freq: Two times a day (BID) | OROMUCOSAL | Status: DC
  Administered 2016-02-06 – 2016-02-09 (×4): 7 mL via OROMUCOSAL

## 2016-02-05 MED ORDER — WARFARIN SODIUM 2 MG PO TABS
2.0000 mg | ORAL_TABLET | Freq: Once | ORAL | Status: AC
  Administered 2016-02-05: 2 mg via ORAL
  Filled 2016-02-05: qty 1

## 2016-02-05 NOTE — Progress Notes (Signed)
HeartMate 2 Rounding Note  Subjective:    S/p L3 to L5 Fusion 02/03/16  Working with PT/OT. Feeling ok this am but now with n/v.  Still with some back pain. Got 1 dose dilaudid this am.   INR 1.65 this am. On heparin coumadin. hgb stable  LVAD INTERROGATION:  HeartMate II LVAD:  Flow 3.7 liters/min, speed 9000, power 4.6, PI 6.6  Objective:    Vital Signs:   Temp:  [98 F (36.7 C)-101.4 F (38.6 C)] 98.4 F (36.9 C) (04/02 1100) Pulse Rate:  [58-65] 60 (04/02 1045) Resp:  [10-15] 10 (04/02 0400) SpO2:  [89 %-94 %] 92 % (04/02 0400) Weight:  [85.866 kg (189 lb 4.8 oz)] 85.866 kg (189 lb 4.8 oz) (04/02 0500) Last BM Date: 02/02/16 Mean arterial Pressure 70-80s   Intake/Output:   Intake/Output Summary (Last 24 hours) at 02/05/16 1151 Last data filed at 02/05/16 1100  Gross per 24 hour  Intake 1825.67 ml  Output    950 ml  Net 875.67 ml     Physical Exam: General:sitting in chair. NAD HEENT: normal Neck: supple. RIJ TLC. JVP 8 cm. R CEA site C/D/I Carotids 2+ bilat; no bruits. No thyromegaly or nodule noted. Cor: Mechanical heart sounds with LVAD hum present. Lungs: CTAB, normal effort Abdomen: soft, NT, ND, no HSM. No bruits or masses. +BS Driveline: C/D/I; securement device intact and driveline incorporated Extremities: no cyanosis, clubbing, rash, tr edema Neuro: alert & orientedx3, cranial nerves grossly intact. moves all 4 extremities w/o difficulty. Affect pleasant  Telemetry: Reviewed, A pacing 50-60s  Labs: Basic Metabolic Panel:  Recent Labs Lab 02/01/16 1646 02/03/16 0914 02/04/16 0506 02/05/16 0442  NA 136 138 136 135  K 4.2 4.1 4.7 4.3  CL 105  --  107 104  CO2 22  --  22 25  GLUCOSE 121*  --  120* 107*  BUN 22*  --  16 19  CREATININE 1.47*  --  1.17 1.41*  CALCIUM 8.8*  --  8.9 8.6*    Liver Function Tests:  Recent Labs Lab 02/01/16 1646  AST 30  ALT 19  ALKPHOS 123  BILITOT 0.6  PROT 6.4*  ALBUMIN 3.3*   No results for  input(s): LIPASE, AMYLASE in the last 168 hours. No results for input(s): AMMONIA in the last 168 hours.  CBC:  Recent Labs Lab 02/03/16 0340 02/03/16 0914 02/03/16 1840 02/04/16 0506 02/04/16 2317 02/05/16 0442  WBC 6.2  --  8.2 10.2 9.0 8.9  NEUTROABS  --   --  7.2  --   --   --   HGB 10.1* 10.9* 9.7* 9.8* 9.4* 9.1*  HCT 31.8* 32.0* 29.9* 30.3* 28.5* 29.4*  MCV 90.1  --  89.3 89.6 91.6 91.9  PLT 176  --  136* 131* 123* 129*    INR:  Recent Labs Lab 02/02/16 0420 02/02/16 1533 02/03/16 0340 02/04/16 0506 02/05/16 0442  INR 1.79* 1.80* 1.63* 1.42 1.65*    Other results:    Imaging: Dg Chest Port 1 View  02/05/2016  CLINICAL DATA:  Acute onset of postoperative fever. Initial encounter. EXAM: PORTABLE CHEST 1 VIEW COMPARISON:  Chest radiograph performed earlier today at 4:14 a.m. FINDINGS: The lungs are mildly hypoexpanded. Patchy bilateral airspace opacities may reflect mild interstitial edema or pneumonia. No definite pleural effusion or pneumothorax is seen. The cardiomediastinal silhouette is mildly enlarged. The patient is status post median sternotomy. A pacemaker/AICD is noted overlying the left chest wall, with leads ending  overlying the right atrium and right ventricle. A left ventricular assist device is noted. The patient's right IJ line is noted ending about the mid to distal SVC. No acute osseous abnormalities are seen. IMPRESSION: Lungs mildly hypoexpanded. Patchy bilateral airspace opacities may reflect mild interstitial edema or pneumonia. Mild cardiomegaly. Electronically Signed   By: Garald Balding M.D.   On: 02/05/2016 03:30   Dg Chest Port 1 View  02/04/2016  CLINICAL DATA:  Left ventricular assist device. EXAM: PORTABLE CHEST 1 VIEW COMPARISON:  January 17, 2016. FINDINGS: Stable cardiomegaly. Left ventricular assist device again noted and unchanged in position. Left-sided pacemaker is unchanged. Interval placement of right internal jugular catheter with  distal tip in expected position of the SVC. No pneumothorax is noted. Minimal left pleural effusion is noted. Mild left basilar subsegmental atelectasis is stable. Right lung is clear. Bony thorax is unremarkable. IMPRESSION: Stable position of left ventricular assist device. Stable mild left basilar subsegmental listhesis with minimal pleural effusion is noted. Electronically Signed   By: Marijo Conception, M.D.   On: 02/04/2016 07:26     Medications:     Scheduled Medications: . sodium chloride   Intravenous Once  . amiodarone  200 mg Oral Daily  . amLODipine  10 mg Oral QHS  . [START ON 02/06/2016] antiseptic oral rinse  7 mL Mouth Rinse BID  . atorvastatin  80 mg Oral Daily  . docusate sodium  100 mg Oral BID  . ferrous gluconate  324 mg Oral BID WC  . guaiFENesin  600 mg Oral BID  . levothyroxine  200-300 mcg Oral Once per day on Sun Tue Wed Thu Fri Sat  . [START ON 02/06/2016] levothyroxine  300 mcg Oral Once per day on Mon  . metoprolol succinate  25 mg Oral BID  . pantoprazole  40 mg Oral BID  . ranolazine  500 mg Oral BID  . sodium chloride flush  3 mL Intravenous Q12H  . warfarin  2 mg Oral ONCE-1800  . Warfarin - Pharmacist Dosing Inpatient   Does not apply q1800    Infusions: . sodium chloride    . dextrose 5 % and 0.45 % NaCl with KCl 20 mEq/L Stopped (02/04/16 0900)  . heparin 1,300 Units/hr (02/05/16 0802)  . norepinephrine (LEVOPHED) Adult infusion Stopped (02/03/16 1547)    PRN Medications: acetaminophen **OR** acetaminophen, alum & mag hydroxide-simeth, bisacodyl, docusate sodium, furosemide, HYDROcodone-acetaminophen, HYDROmorphone (DILAUDID) injection, menthol-cetylpyridinium **OR** phenol, methocarbamol **OR** methocarbamol (ROBAXIN)  IV, nitroGLYCERIN, ondansetron (ZOFRAN) IV, oxyCODONE-acetaminophen, polyethylene glycol, promethazine, sodium chloride flush, sodium phosphate   Assessment:   1. Debilitating back pain due to lumbar compression fracture - Major  limiting factor of his functional status    --S/p L3 to L5 Fusion 02/03/16 2. 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 PAF/AFL with RVR s/p TEEdccv on 01/19/16 4. CKD stage III 5. 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 Now on Plavix and warfarin at home.  6. High-grade R internal carotid stenosis - s/p R CEA 01/13/16 - Had f/u 3/28. Stable.   Plan/Discussion:    Doing well post-op. MAPs ok.  On heparin and coumadin. INR 1.65. hgb stable. Pain management currently adequate but having nausea. Will start tramadol in an effort to minimize narcotic use. Prn zofran. PT/OT have seen. VAD parameters ok. Volume status improved with lasix yesterday. May be a little on the dry side. Can give IVF as needed. Electrolytes ok. Can go to SDU. Continue  to ambulate and manage pain. Maintaining NSR on low dose amio/ranexa.   I reviewed the LVAD parameters from today, and compared the results to the patient's prior recorded data.  No programming changes were made.  The LVAD is functioning within specified parameters.  The patient performs LVAD self-test daily.  LVAD interrogation was negative for any significant power changes, alarms or PI events/speed drops.  LVAD equipment check completed and is in good working order.  Back-up equipment present.   LVAD education done on emergency procedures and precautions and reviewed exit site care.  Length of Stay: 4  Kassidi Elza MD 02/05/2016, 11:51 AM  VAD Team --- VAD ISSUES ONLY--- Pager [phone] (7am - 7am)  Advanced Heart Failure Team  Pager [phone] (M-F; 7a - 4p)  Please contact [facility] for night-coverage after hours (4p -7a ) and weekends on [email or web address]

## 2016-02-05 NOTE — Progress Notes (Signed)
Patient had a temperature of 101.4 F at 20:00. Acetaminophen was given and temperature decreased to 99.0. Dr. Radford Pax was paged. New orders given for blood cultures, CBC, urinalysis, and chest x-ray. Dr. Cyndy Freeze was notified. Will continue to monitor.

## 2016-02-05 NOTE — Progress Notes (Signed)
Physical Therapy Treatment Patient Details Name: [name] MRN: [id number] DOB: [birth year] Today's Date: 02/05/2016    History of Present Illness 75 yo male admitted 02/01/16 with L4 compression fracture, osteonecrosis, and intractable back pain.  Patient now s/p L3-5 fusion.   PMH:  CHF, LVAD 2014, PAD, ICM, s/p Rt CEA, Flutter with RVR s/p DCCV, CVA, PAF, CAD, MI, HTN, CKD, PVD, HLD    PT Comments    Patient making progress with mobility and gait.  Able to ambulate 24' with min assist and RW.  Fatigues quickly.  Needs to review back precautions related to bed mobility.  Follow Up Recommendations  No PT follow up;Supervision - Intermittent     Equipment Recommendations  None recommended by PT    Recommendations for Other Services       Precautions / Restrictions Precautions Precautions: Back;Fall Restrictions Weight Bearing Restrictions: No    Mobility  Bed Mobility Overal bed mobility: Needs Assistance Bed Mobility: Rolling;Sidelying to Sit;Sit to Supine Rolling: Min guard Sidelying to sit: Min guard   Sit to supine: Min assist   General bed mobility comments: Patient able to change LVAD to batteries independently for gait training.  Patient with difficulty moving sit > sidelying and moved more to supine.  Reviewed technique to maintain back precautions.  Assist to reposition once returned to supine.   Transfers Overall transfer level: Needs assistance Equipment used: Rolling walker (2 wheeled) Transfers: Sit to/from Stand Sit to Stand: Min assist         General transfer comment: Assist to steady during transfers.  Ambulation/Gait Ambulation/Gait assistance: Min assist;+2 safety/equipment Ambulation Distance (Feet): 35 Feet Assistive device: Rolling walker (2 wheeled) Gait Pattern/deviations: Step-through pattern;Decreased stride length;Shuffle Gait velocity: decreased Gait velocity interpretation: Below normal speed for age/gender General Gait  Details: Slow gait.  Slightly unsteady.  Fatigues quickly.   Stairs            Wheelchair Mobility    Modified Rankin (Stroke Patients Only)       Balance Overall balance assessment: Needs assistance         Standing balance support: Single extremity supported Standing balance-Leahy Scale: Poor                      Cognition Arousal/Alertness: Awake/alert Behavior During Therapy: WFL for tasks assessed/performed Overall Cognitive Status: Within Functional Limits for tasks assessed                      Exercises      General Comments        Pertinent Vitals/Pain Pain Assessment: 0-10 Pain Score: 3  Pain Location: back Pain Descriptors / Indicators: Aching;Sore Pain Intervention(s): Monitored during session;Repositioned    Home Living                      Prior Function            PT Goals (current goals can now be found in the care plan section) Progress towards PT goals: Progressing toward goals    Frequency  Min 5X/week    PT Plan Current plan remains appropriate    Co-evaluation             End of Session Equipment Utilized During Treatment: Oxygen Activity Tolerance: Patient limited by fatigue;Patient limited by pain Patient left: in bed;with call bell/phone within reach     Time: [id number] PT Time Calculation (min) (ACUTE ONLY): 23 min  Charges:  $Gait Training: 8-22 mins $Therapeutic Activity: 8-22 mins                    G Codes:      Despina Pole 02-14-16, 3:38 PM

## 2016-02-05 NOTE — Progress Notes (Addendum)
ANTICOAGULATION CONSULT NOTE - Follow Up Consult  Pharmacy Consult for heparin/warfarin Indication: LVAD  Allergies  Allergen Reactions  . Demerol [Meperidine] Other (See Comments)    Paralysis. Could only move eyes.    Patient Measurements: Height: 6' (182.9 cm) Weight: 189 lb 4.8 oz (85.866 kg) IBW/kg (Calculated) : 77.6  Vital Signs: Temp: 98 F (36.7 C) (04/02 0400) Temp Source: Oral (04/02 0400) Pulse Rate: 59 (04/02 0400)  Labs:  Recent Labs  02/03/16 0340  02/04/16 0506 02/04/16 2017 02/04/16 2317 02/05/16 0442  HGB 10.1*  < > 9.8*  --  9.4* 9.1*  HCT 31.8*  < > 30.3*  --  28.5* 29.4*  PLT 176  < > 131*  --  123* 129*  LABPROT 19.3*  --  17.5*  --   --  19.5*  INR 1.63*  --  1.42  --   --  1.65*  HEPARINUNFRC 0.54  --   --  0.23*  --  0.30  CREATININE  --   --  1.17  --   --  1.41*  < > = values in this interval not displayed.  Estimated Creatinine Clearance: 48.9 mL/min (by C-G formula based on Cr of 1.41).  Assessment: 74yom on coumadin pta for his LVAD, admitted for elective back surgery. Coumadin held and he was bridged with IV heparin. He is now s/p surgery and restarted on IV heparin. No bleeding or problems noted. PTA dose: 2mg  daily except 4mg  on Monday -HL 0.30, INR 1.65, Hgb 9.4, plts 123  Goal of Therapy:  INR 2-3, HL 0.3 - 0.7 Monitor platelets by anticoagulation protocol: Yes   Plan:  -Increase heparin gtt slightly to 1300 units/hr -Warfarin 2mg  po x 1 tonight -Confirmatory 8 hour heparin level -Daily HL/CBC -Monitor for S&S of bleed  Angela Burke, PharmD Pharmacy Resident Pager: [phone]  02/05/2016 7:54 AM

## 2016-02-05 NOTE — Evaluation (Addendum)
Occupational Therapy Evaluation Patient Details Name: Frank YAROSH MRN: [id number] DOB: 24-Nov-1940 Today's Date: 02/05/2016    History of Present Illness 75 yo male admitted 02/01/16 with L4 compression fracture, osteonecrosis, and intractable back pain.  Patient now s/p L3-5 fusion.   PMH:  CHF, LVAD 2014, PAD, ICM, s/p Rt CEA, Flutter with RVR s/p DCCV, CVA, PAF, CAD, MI, HTN, CKD, PVD, HLD   Clinical Impression   Patient is s/p L3-5 PLIF surgery resulting in functional limitations due to the deficits listed below (see OT problem list). PTA independent with all adls and has an LVAD. Patient will benefit from skilled OT acutely to increase independence and safety with ADLS to allow discharge home without follow. Pt currently with oxygen saturations dropping talking in the bed on RA to 83%. Pt does not normally use oxygen at home. Ot to assess LB dressing next session. This session was limited by nausea and coughing up secretions.      Follow Up Recommendations  No OT follow up    Equipment Recommendations  None recommended by OT    Recommendations for Other Services       Precautions / Restrictions Precautions Precautions: Back;Fall Precaution Comments: LVAD- last fall August 2016      Mobility Bed Mobility Overal bed mobility: Needs Assistance Bed Mobility: Supine to Sit   Sidelying to sit: Min guard Supine to sit: Min guard     General bed mobility comments: incr time and v/c for back precautions  Transfers Overall transfer level: Needs assistance Equipment used: Rolling walker (2 wheeled) Transfers: Sit to/from Stand Sit to Stand: Min assist;From elevated surface         General transfer comment: pt able to power up with incr time. pt reports I dont hurt as bad as yesterday I can do more    Balance                                            ADL Overall ADL's : Needs assistance/impaired Eating/Feeding: Set up;Bed level   Grooming:  Wash/dry Archivist: Minimal assistance;Stand-pivot           Functional mobility during ADLs: Minimal assistance;Rolling walker General ADL Comments: Pt is unable to cross bil LE at this time. Pt demonstrates independent ability to connect LVAD to batteries and able to verbalize each step to OT. Pt requires incr time this session due to nausea. Pt reports since 2013 having this sudden onset of nausea at different times. pt provided IV medication and able to progress to chair level. pt long sitting to exit bed due to LVAD lead on L lateral chest. Pt provided handout for back precautions and reviewed. OT to work next session on LB care pending d/c home     Vision Vision Assessment?: No apparent visual deficits   Perception     Praxis      Pertinent Vitals/Pain Pain Assessment: Faces Faces Pain Scale: Hurts little more Pain Location: back/ nausea Pain Intervention(s): Monitored during session;Repositioned;Premedicated before session     Hand Dominance Right   Extremity/Trunk Assessment Upper Extremity Assessment Upper Extremity Assessment: Overall WFL for tasks assessed   Lower Extremity Assessment Lower Extremity Assessment: Defer to PT evaluation   Cervical / Trunk Assessment Cervical / Trunk Assessment:  Normal   Communication Communication Communication: No difficulties   Cognition Arousal/Alertness: Awake/alert Behavior During Therapy: WFL for tasks assessed/performed Overall Cognitive Status: Within Functional Limits for tasks assessed                     General Comments       Exercises       Shoulder Instructions      Home Living Family/patient expects to be discharged to:: Private residence Living Arrangements: Spouse/significant other Available Help at Discharge: Family;Available PRN/intermittently Type of Home: House Home Access: Stairs to enter [employer] of Steps: 1 Entrance  Stairs-Rails: None Home Layout: Two level;Able to live on main level with bedroom/bathroom     Bathroom Shower/Tub: Tub/shower unit   Bathroom Toilet: Handicapped height     Home Equipment: Environmental consultant - 2 wheels;Walker - 4 wheels;Cane - single point;Bedside commode;Grab bars - tub/shower;Toilet riser;Tub bench;Grab bars - toilet   Additional Comments: tub but does not use      Prior Functioning/Environment Level of Independence: Independent        Comments: Reports he drives short distances    OT Diagnosis: Generalized weakness;Acute pain   OT Problem List: Decreased strength;Decreased activity tolerance;Impaired balance (sitting and/or standing);Decreased safety awareness;Decreased knowledge of use of DME or AE;Decreased knowledge of precautions;Cardiopulmonary status limiting activity;Pain   OT Treatment/Interventions: Self-care/ADL training;DME and/or AE instruction;Therapeutic activities;Patient/family education;Balance training    OT Goals(Current goals can be found in the care plan section) Acute Rehab OT Goals Patient Stated Goal: To attend the heart walk in MAY and go to the reunion for the LVAD patients in [place] . Sandler reports he is number 24 out 46 completed here at [facility] OT Goal Formulation: With patient Time For Goal Achievement: 02/19/16 Potential to Achieve Goals: Good  OT Frequency: Min 2X/week   Barriers to D/C:            Co-evaluation              End of Session Equipment Utilized During Treatment: Rolling walker;Other (comment) (LVAD) Nurse Communication: Mobility status;Precautions  Activity Tolerance: Patient tolerated treatment well Patient left: in chair;with call bell/phone within reach;with chair alarm set   Time: [phone] OT Time Calculation (min): 47 min Charges:  OT General Charges $OT Visit: 1 Procedure OT Evaluation $OT Eval High Complexity: 1 Procedure OT Treatments $Self Care/Home Management : 23-37 mins G-Codes:    Peri Maris 2016/02/25, 10:21 AM  Jeri Modena   OTR/L [phone] Office: [phone] .

## 2016-02-05 NOTE — Progress Notes (Signed)
ANTICOAGULATION CONSULT NOTE - Follow Up Consult  Pharmacy Consult for heparin Indication: LVAD  Allergies  Allergen Reactions  . Demerol [Meperidine] Other (See Comments)    Paralysis. Could only move eyes.    Patient Measurements: Height: 6' (182.9 cm) Weight: 189 lb 4.8 oz (85.866 kg) IBW/kg (Calculated) : 77.6  Vital Signs: Temp: 99.2 F (37.3 C) (04/02 1600) Temp Source: Oral (04/02 1100) Pulse Rate: 84 (04/02 1100)  Labs:  Recent Labs  02/03/16 0340  02/04/16 0506 02/04/16 2017 02/04/16 2317 02/05/16 0442 02/05/16 1808  HGB 10.1*  < > 9.8*  --  9.4* 9.1*  --   HCT 31.8*  < > 30.3*  --  28.5* 29.4*  --   PLT 176  < > 131*  --  123* 129*  --   LABPROT 19.3*  --  17.5*  --   --  19.5*  --   INR 1.63*  --  1.42  --   --  1.65*  --   HEPARINUNFRC 0.54  --   --  0.23*  --  0.30 0.33  CREATININE  --   --  1.17  --   --  1.41*  --   < > = values in this interval not displayed.  Estimated Creatinine Clearance: 48.9 mL/min (by C-G formula based on Cr of 1.41).  Assessment: 74yom on coumadin pta for his LVAD, admitted for elective back surgery. Coumadin held and he was bridged with IV heparin. He is now s/p surgery and restarted on IV heparin. No bleeding or problems noted. Heparin level remains therapeutic at 0.33.   Goal of Therapy:  INR 2-3, HL 0.3 - 0.7 Monitor platelets by anticoagulation protocol: Yes   Plan:  - Continue heparin gtt at 1300 units/hr - F/u AM HL and CBC  Salome Arnt, PharmD, BCPS Pager # [phone] 02/05/2016 6:31 PM

## 2016-02-05 NOTE — Progress Notes (Signed)
No acute events Pain getting better Moves legs well Incision looks good No new surgical recommendations

## 2016-02-06 LAB — BASIC METABOLIC PANEL
Anion gap: 6 (ref 5–15)
BUN: 21 mg/dL — AB (ref 6–20)
CALCIUM: 8.6 mg/dL — AB (ref 8.9–10.3)
CHLORIDE: 104 mmol/L (ref 101–111)
CO2: 25 mmol/L (ref 22–32)
CREATININE: 1.28 mg/dL — AB (ref 0.61–1.24)
GFR calc Af Amer: >60 mL/min (ref >60)
GFR calc non Af Amer: 53 mL/min — ABNORMAL LOW (ref >60)
Glucose, Bld: 101 mg/dL — ABNORMAL HIGH (ref 65–99)
Potassium: 4.7 mmol/L (ref 3.5–5.1)
SODIUM: 135 mmol/L (ref 135–145)

## 2016-02-06 LAB — TYPE AND SCREEN
ABO/RH(D): O POS
Antibody Screen: NEGATIVE
UNIT DIVISION: 0
Unit division: 0

## 2016-02-06 LAB — CBC
HEMATOCRIT: 26.5 % — AB (ref 39.0–52.0)
HEMOGLOBIN: 8.3 g/dL — AB (ref 13.0–17.0)
MCH: 28.9 pg (ref 26.0–34.0)
MCHC: 31.3 g/dL (ref 30.0–36.0)
MCV: 92.3 fL (ref 78.0–100.0)
Platelets: 132 10*3/uL — ABNORMAL LOW (ref 150–400)
RBC: 2.87 MIL/uL — ABNORMAL LOW (ref 4.22–5.81)
RDW: 17.3 % — AB (ref 11.5–15.5)
WBC: 8 10*3/uL (ref 4.0–10.5)

## 2016-02-06 LAB — PROTIME-INR
INR: 1.61 — ABNORMAL HIGH (ref 0.00–1.49)
PROTHROMBIN TIME: 19.2 s — AB (ref 11.6–15.2)

## 2016-02-06 LAB — LACTATE DEHYDROGENASE: LDH: 217 U/L — ABNORMAL HIGH (ref 98–192)

## 2016-02-06 LAB — HEPARIN LEVEL (UNFRACTIONATED): Heparin Unfractionated: 0.33 IU/mL (ref 0.30–0.70)

## 2016-02-06 MED ORDER — MAGNESIUM HYDROXIDE 400 MG/5ML PO SUSP
30.0000 mL | Freq: Every day | ORAL | Status: DC | PRN
  Administered 2016-02-06: 30 mL via ORAL
  Filled 2016-02-06: qty 30

## 2016-02-06 MED ORDER — BISACODYL 5 MG PO TBEC
5.0000 mg | DELAYED_RELEASE_TABLET | Freq: Every day | ORAL | Status: DC | PRN
  Administered 2016-02-08: 5 mg via ORAL
  Filled 2016-02-06: qty 1

## 2016-02-06 MED ORDER — WARFARIN SODIUM 2 MG PO TABS
4.0000 mg | ORAL_TABLET | Freq: Once | ORAL | Status: AC
  Administered 2016-02-06: 4 mg via ORAL
  Filled 2016-02-06: qty 2

## 2016-02-06 NOTE — Progress Notes (Signed)
Physical Therapy Treatment Patient Details Name: [name] MRN: [id number] DOB: April 09, 1941 Today's Date: 02/06/2016    History of Present Illness 75 yo male admitted 02/01/16 with L4 compression fracture, osteonecrosis, and intractable back pain.  Patient now s/p L3-5 fusion.   PMH:  CHF, LVAD 2014, PAD, ICM, s/p Rt CEA, Flutter with RVR s/p DCCV, CVA, PAF, CAD, MI, HTN, CKD, PVD, HLD    PT Comments    Patient seen for increased activity, education, and mobility progression. Patient tolerated functional activities well. Ambulated in hall with min guard. Good recall of back precautions. Activity tolerance remains limited by overall fatigue and nausea. Will continue to see and progress as tolerated.   Follow Up Recommendations  No PT follow up;Supervision - Intermittent     Equipment Recommendations  None recommended by PT    Recommendations for Other Services       Precautions / Restrictions Precautions Precautions: Back;Fall Precaution Comments: pt able to state 3/3 back precautions with extra time, LVAD x 2 1/2 years Restrictions Weight Bearing Restrictions: No    Mobility  Bed Mobility Overal bed mobility: Needs Assistance Bed Mobility: Supine to Sit   Sidelying to sit: Min guard Supine to sit: Min guard     General bed mobility comments: Vcs for positioning and attention to line management. Cues for back precautions  Transfers Overall transfer level: Needs assistance Equipment used: Rolling walker (2 wheeled) Transfers: Sit to/from Stand Sit to Stand: Min guard         General transfer comment: Min guard for safety, no physical assist required. VCs for hand placement when cojming to standing  Ambulation/Gait Ambulation/Gait assistance: Min guard Ambulation Distance (Feet): 50 Feet Assistive device: Rolling walker (2 wheeled) Gait Pattern/deviations: Step-through pattern;Decreased stride length;Shuffle Gait velocity: decreased Gait velocity  interpretation: Below normal speed for age/gender General Gait Details: patient with slow and guarded gait, VCs for increased cadence.  Modest instability noted. Continues to fatigue quickly.   Stairs            Wheelchair Mobility    Modified Rankin (Stroke Patients Only)       Balance                                    Cognition Arousal/Alertness: Awake/alert Behavior During Therapy: WFL for tasks assessed/performed Overall Cognitive Status: Within Functional Limits for tasks assessed                      Exercises      General Comments        Pertinent Vitals/Pain Pain Assessment: Faces Faces Pain Scale: Hurts even more Pain Location: back Pain Descriptors / Indicators: Aching;Guarding Pain Intervention(s): Limited activity within patient's tolerance;Monitored during session;Repositioned    Home Living                      Prior Function            PT Goals (current goals can now be found in the care plan section) Acute Rehab PT Goals PT Goal Formulation: With patient/family Time For Goal Achievement: 02/11/16 Potential to Achieve Goals: Good Progress towards PT goals: Progressing toward goals    Frequency  Min 5X/week    PT Plan Current plan remains appropriate    Co-evaluation PT/OT/SLP Co-Evaluation/Treatment: Yes Reason for Co-Treatment: Other (comment) PT goals addressed during session: Mobility/safety with mobility OT  goals addressed during session: ADL's and self-care     End of Session   Activity Tolerance: Patient limited by fatigue;Patient limited by pain (Limited by nausea) Patient left: in chair;with call bell/phone within reach;with family/visitor present     Time: [id number] PT Time Calculation (min) (ACUTE ONLY): 50 min  Charges:  $Gait Training: 8-22 mins                    G CodesDuncan Dull February 07, 2016, 10:09 AM Alben Deeds, PT DPT  [phone]

## 2016-02-06 NOTE — Progress Notes (Signed)
ANTICOAGULATION CONSULT NOTE - Follow Up Consult  Pharmacy Consult for heparin Indication: LVAD  Allergies  Allergen Reactions  . Demerol [Meperidine] Other (See Comments)    Paralysis. Could only move eyes.    Patient Measurements: Height: 6' (182.9 cm) Weight: 191 lb 4.8 oz (86.773 kg) IBW/kg (Calculated) : 77.6  Vital Signs: Temp: 97.9 F (36.6 C) (04/03 0700) Temp Source: Oral (04/03 0700) Pulse Rate: 59 (04/03 0700)  Labs:  Recent Labs  02/04/16 0506  02/04/16 2317 02/05/16 0442 02/05/16 1808 02/06/16 0335  HGB 9.8*  --  9.4* 9.1*  --  8.3*  HCT 30.3*  --  28.5* 29.4*  --  26.5*  PLT 131*  --  123* 129*  --  132*  LABPROT 17.5*  --   --  19.5*  --  19.2*  INR 1.42  --   --  1.65*  --  1.61*  HEPARINUNFRC  --   < >  --  0.30 0.33 0.33  CREATININE 1.17  --   --  1.41*  --  1.28*  < > = values in this interval not displayed.  Estimated Creatinine Clearance: 53.9 mL/min (by C-G formula based on Cr of 1.28).  Assessment: 74yom on coumadin pta for his LVAD, admitted for elective back surgery. Coumadin held and he was bridged with IV heparin. He is now s/p surgery and restarted on IV heparin. No bleeding or problems noted. Heparin level remains therapeutic at 0.33.  Heparin drip rate 1300 uts/hr INR remains < goal at 1.6 will give boost tonight Plan to restart clopidogrel when INR > 2  Goal of Therapy:  INR 2-3, HL 0.3 - 0.7 Monitor platelets by anticoagulation protocol: Yes   Plan:  - Continue heparin gtt at 1300 units/hr Warfarin 4mg  x1 Daily CBC, HL, INR  Bonnita Nasuti Pharm.D. CPP, BCPS Clinical Pharmacist [phone] 02/06/2016 11:06 AM

## 2016-02-06 NOTE — Progress Notes (Signed)
HeartMate 2 Rounding Note  Subjective:    S/P L3 to L5 Fusion 02/03/16  Working with PT/OT. Ambulated 24'. Has not had BM since admit . Pain controlled. Denies SOB.     INR 1.61 this am. On heparin coumadin. Hgb trending down. 9.1>8.3  LVAD INTERROGATION:  HeartMate II LVAD:  Flow 3.7 liters/min, speed 8990, power 4.6, PI 6.2 Rare PI   Objective:    Vital Signs:   Temp:  [98.4 F (36.9 C)-99.2 F (37.3 C)] 98.6 F (37 C) (04/03 0400) Pulse Rate:  [52-84] 59 (04/03 0600) Resp:  [4-16] 10 (04/03 0600) SpO2:  [93 %-98 %] 97 % (04/03 0600) Weight:  [191 lb 4.8 oz (86.773 kg)] 191 lb 4.8 oz (86.773 kg) (04/03 0400) Last BM Date: 02/02/16 Mean arterial Pressure 70-80s   Intake/Output:   Intake/Output Summary (Last 24 hours) at 02/06/16 0650 Last data filed at 02/06/16 0600  Gross per 24 hour  Intake 540.57 ml  Output    500 ml  Net  40.57 ml     Physical Exam: General:In bed  NAD HEENT: normal Neck: supple. RIJ TLC. JVP 9-10 cm. R CEA site C/D/I Carotids 2+ bilat; no bruits. No thyromegaly or nodule noted. Cor: Mechanical heart sounds with LVAD hum present. Lungs: CTAB, normal effort Abdomen: soft, NT, ND, no HSM. No bruits or masses. +BS Driveline: C/D/I; securement device intact and driveline incorporated Extremities: no cyanosis, clubbing, rash, tr edema Neuro: alert & orientedx3, cranial nerves grossly intact. moves all 4 extremities w/o difficulty. Affect pleasant  Telemetry: Reviewed, A pacing 50-60s  Labs: Basic Metabolic Panel:  Recent Labs Lab 02/01/16 1646 02/03/16 0914 02/04/16 0506 02/05/16 0442 02/06/16 0335  NA 136 138 136 135 135  K 4.2 4.1 4.7 4.3 4.7  CL 105  --  107 104 104  CO2 22  --  22 25 25   GLUCOSE 121*  --  120* 107* 101*  BUN 22*  --  16 19 21*  CREATININE 1.47*  --  1.17 1.41* 1.28*  CALCIUM 8.8*  --  8.9 8.6* 8.6*    Liver Function Tests:  Recent Labs Lab 02/01/16 1646  AST 30  ALT 19  ALKPHOS 123  BILITOT 0.6    PROT 6.4*  ALBUMIN 3.3*   No results for input(s): LIPASE, AMYLASE in the last 168 hours. No results for input(s): AMMONIA in the last 168 hours.  CBC:  Recent Labs Lab 02/03/16 1840 02/04/16 0506 02/04/16 2317 02/05/16 0442 02/06/16 0335  WBC 8.2 10.2 9.0 8.9 8.0  NEUTROABS 7.2  --   --   --   --   HGB 9.7* 9.8* 9.4* 9.1* 8.3*  HCT 29.9* 30.3* 28.5* 29.4* 26.5*  MCV 89.3 89.6 91.6 91.9 92.3  PLT 136* 131* 123* 129* 132*    INR:  Recent Labs Lab 02/02/16 1533 02/03/16 0340 02/04/16 0506 02/05/16 0442 02/06/16 0335  INR 1.80* 1.63* 1.42 1.65* 1.61*    Other results:    Imaging: Dg Chest Port 1 View  02/05/2016  CLINICAL DATA:  Acute onset of postoperative fever. Initial encounter. EXAM: PORTABLE CHEST 1 VIEW COMPARISON:  Chest radiograph performed earlier today at 4:14 a.m. FINDINGS: The lungs are mildly hypoexpanded. Patchy bilateral airspace opacities may reflect mild interstitial edema or pneumonia. No definite pleural effusion or pneumothorax is seen. The cardiomediastinal silhouette is mildly enlarged. The patient is status post median sternotomy. A pacemaker/AICD is noted overlying the left chest wall, with leads ending overlying the right atrium  and right ventricle. A left ventricular assist device is noted. The patient's right IJ line is noted ending about the mid to distal SVC. No acute osseous abnormalities are seen. IMPRESSION: Lungs mildly hypoexpanded. Patchy bilateral airspace opacities may reflect mild interstitial edema or pneumonia. Mild cardiomegaly. Electronically Signed   By: Garald Balding M.D.   On: 02/05/2016 03:30     Medications:     Scheduled Medications: . sodium chloride   Intravenous Once  . amiodarone  200 mg Oral Daily  . amLODipine  10 mg Oral QHS  . antiseptic oral rinse  7 mL Mouth Rinse BID  . atorvastatin  80 mg Oral Daily  . docusate sodium  100 mg Oral BID  . ferrous gluconate  324 mg Oral BID WC  . guaiFENesin  600 mg Oral  BID  . levothyroxine  200-300 mcg Oral Once per day on Sun Tue Wed Thu Fri Sat  . levothyroxine  300 mcg Oral Once per day on Mon  . metoprolol succinate  25 mg Oral BID  . pantoprazole  40 mg Oral BID  . ranolazine  500 mg Oral BID  . sodium chloride flush  3 mL Intravenous Q12H  . traMADol  50 mg Oral Q8H  . Warfarin - Pharmacist Dosing Inpatient   Does not apply q1800    Infusions: . sodium chloride    . dextrose 5 % and 0.45 % NaCl with KCl 20 mEq/L Stopped (02/04/16 0900)  . heparin 1,300 Units/hr (02/06/16 0000)  . norepinephrine (LEVOPHED) Adult infusion Stopped (02/03/16 1547)    PRN Medications: acetaminophen **OR** acetaminophen, alum & mag hydroxide-simeth, bisacodyl, docusate sodium, furosemide, HYDROcodone-acetaminophen, HYDROmorphone (DILAUDID) injection, menthol-cetylpyridinium **OR** phenol, methocarbamol **OR** methocarbamol (ROBAXIN)  IV, nitroGLYCERIN, ondansetron (ZOFRAN) IV, oxyCODONE-acetaminophen, polyethylene glycol, promethazine, sodium chloride flush, sodium phosphate   Assessment:   1. Debilitating back pain due to lumbar compression fracture - Major limiting factor of his functional status    --S/p L3 to L5 Fusion 02/03/16 2. 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 PAF/AFL with RVR s/p TEEdccv on 01/19/16 4. CKD stage III 5. 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 Now on Plavix and warfarin at home.  6. High-grade R internal carotid stenosis - s/p R CEA 01/13/16 - Had f/u 3/28. Stable.   Plan/Discussion:    Doing well post-op. MAPs ok.  On heparin and coumadin. INR 1.61. Hgb trending down. 9.1>8.3. On PPI , iron, and stool softner. May need need blood if Hgb drops.    Maintaining NSR on low dose amio/ranexa.    PT following. Has rolling walker at home.   I reviewed the LVAD parameters from today, and compared the results to the patient's prior recorded data.  No programming changes were made.  The LVAD is functioning within specified  parameters.  The patient performs LVAD self-test daily.  LVAD interrogation was negative for any significant power changes, alarms or PI events/speed drops.  LVAD equipment check completed and is in good working order.  Back-up equipment present.   LVAD education done on emergency procedures and precautions and reviewed exit site care.  Length of Stay: 5  Amy Clegg NP-C  02/06/2016, 6:50 AM  VAD Team --- VAD ISSUES ONLY--- Pager [phone] (7am - 7am)  Advanced Heart Failure Team  Pager [phone] (M-F; 7a - 4p)  Please contact [facility] for night-coverage after hours (4p -7a ) and weekends on [email or web address]   Patient seen and examined with Amy  Ninfa Meeker, NP. We discussed all aspects of the encounter. I agree with the assessment and plan as stated above.   Doing well post-op. Pain under control. Hgb down. No evidence of obvious bleeding. Will follow closely. Get Type and screen   Continue heparin/coumadin. Start Plavix once heparin off. Maintaining NSR on amio/ranexa. Volume status ok. Electrolytes ok.  Continue PT. Can got to SDU.   VAD parameters stable.  Oberia Beaudoin,MD 8:11 AM

## 2016-02-06 NOTE — Progress Notes (Signed)
Occupational Therapy Treatment Patient Details Name: [name] MRN: [id number] DOB: [birth year] Today's Date: 02/06/2016    History of present illness 75 yo male admitted 02/01/16 with L4 compression fracture, osteonecrosis, and intractable back pain.  Patient now s/p L3-5 fusion.   PMH:  CHF, LVAD 2014, PAD, ICM, s/p Rt CEA, Flutter with RVR s/p DCCV, CVA, PAF, CAD, MI, HTN, CKD, PVD, HLD   OT comments  Pt educated in back precautions related to LB bathing and dressing and in use of AE. Min guard assist for all mobility. Pt with generalized weakness and intermittent nausea. Will continue to follow.  Follow Up Recommendations  No OT follow up    Equipment Recommendations  None recommended by OT    Recommendations for Other Services      Precautions / Restrictions Precautions Precautions: Back;Fall Precaution Comments: pt able to state 3/3 back precautions with extra time, LVAD x 2 1/2 years Restrictions Weight Bearing Restrictions: No       Mobility Bed Mobility Overal bed mobility: Needs Assistance Bed Mobility: Rolling Rolling: Min guard Sidelying to sit: Min guard Supine to sit: Min guard     General bed mobility comments: Vcs for positioning and attention to line management. Cues for back precautions  Transfers Overall transfer level: Needs assistance Equipment used: Rolling walker (2 wheeled) Transfers: Sit to/from Stand Sit to Stand: Min guard         General transfer comment: Min guard for safety, no physical assist required. VCs for hand placement when cojming to standing    Balance             Standing balance-Leahy Scale: Poor                     ADL Overall ADL's : Needs assistance/impaired     Grooming: Wash/dry face;Sitting   Upper Body Bathing: Minimal assitance;Sitting Upper Body Bathing Details (indicate cue type and reason): assist for back   Lower Body Bathing Details (indicate cue type and reason): instructed in use of  long bath sponge Upper Body Dressing : Set up;Sitting Upper Body Dressing Details (indicate cue type and reason): including vest used to carry LVAD equipment Lower Body Dressing: Minimal assistance;Sit to/from stand;With adaptive equipment Lower Body Dressing Details (indicate cue type and reason): practiced use of reacher and sock aide Toilet Transfer: Min Marine scientist Details (indicate cue type and reason): simulated to chair Toileting- Clothing Manipulation and Hygiene: Min guard Toileting - Clothing Manipulation Details (indicate cue type and reason): instructed to avoid twisting with pericare, squat with knees bent     Functional mobility during ADLs: Min guard;Rolling walker;Cueing for safety;+2 for safety/equipment General ADL Comments: pt with intermittent nausea, no vomiting this session      Vision                     Perception     Praxis      Cognition   Behavior During Therapy: WFL for tasks assessed/performed Overall Cognitive Status: Within Functional Limits for tasks assessed                       Extremity/Trunk Assessment               Exercises     Shoulder Instructions       General Comments      Pertinent Vitals/ Pain       Pain Assessment: Faces Faces Pain Scale: Hurts  even more Pain Location: back Pain Descriptors / Indicators: Aching;Guarding Pain Intervention(s): Limited activity within patient's tolerance;Monitored during session;Repositioned  Home Living                                          Prior Functioning/Environment              Frequency Min 2X/week     Progress Toward Goals  OT Goals(current goals can now be found in the care plan section)  Progress towards OT goals: Progressing toward goals  Acute Rehab OT Goals Patient Stated Goal:  To attend the heart walk in MAY and go to the reunion for the LVAD patients in [place]UN[place] . [name] reports he is number 7 out  40 completed here at [place] Discharge plan remains appropriate    Co-evaluation    PT/OT/SLP Co-Evaluation/Treatment: Yes Reason for Co-Treatment: Other (comment) PT goals addressed during session: Mobility/safety with mobility OT goals addressed during session: ADL's and self-care      End of Session Equipment Utilized During Treatment: Rolling walker (LVAD)   Activity Tolerance Patient tolerated treatment well   Patient Left in chair;with call bell/phone within reach   Nurse Communication Mobility status;Precautions        Time: [id number] OT Time Calculation (min): 50 min  Charges: OT General Charges $OT Visit: 1 Procedure OT Treatments $Self Care/Home Management : 8-22 mins $Therapeutic Activity: 8-22 mins  [name] 02/06/2016, 10:13 AM  [phone]

## 2016-02-06 NOTE — Progress Notes (Signed)
Subjective: Patient reports "I've been up three times"  Objective: Vital signs in last 24 hours: Temp:  [97.9 F (36.6 C)-99.2 F (37.3 C)] 97.9 F (36.6 C) (04/03 0700) Pulse Rate:  [52-84] 59 (04/03 0700) Resp:  [4-16] 16 (04/03 0700) SpO2:  [93 %-98 %] 98 % (04/03 0700) Weight:  [86.773 kg (191 lb 4.8 oz)] 86.773 kg (191 lb 4.8 oz) (04/03 0400)  Intake/Output from previous day: 04/02 0701 - 04/03 0700 In: 553.6 [P.O.:240; I.V.:313.6] Out: 800 [Urine:800] Intake/Output this shift:    Alert, conversant, smiling. MAEW. OOB with PT over weekend, progressing nicely. Incisions without erythema, swelling, or drainage beneath honeycomb and Dermabond.   Lab Results:  Recent Labs  02/05/16 0442 02/06/16 0335  WBC 8.9 8.0  HGB 9.1* 8.3*  HCT 29.4* 26.5*  PLT 129* 132*   BMET  Recent Labs  02/05/16 0442 02/06/16 0335  NA 135 135  K 4.3 4.7  CL 104 104  CO2 25 25  GLUCOSE 107* 101*  BUN 19 21*  CREATININE 1.41* 1.28*  CALCIUM 8.6* 8.6*    Studies/Results: Dg Chest Port 1 View  02/05/2016  CLINICAL DATA:  Acute onset of postoperative fever. Initial encounter. EXAM: PORTABLE CHEST 1 VIEW COMPARISON:  Chest radiograph performed earlier today at 4:14 a.m. FINDINGS: The lungs are mildly hypoexpanded. Patchy bilateral airspace opacities may reflect mild interstitial edema or pneumonia. No definite pleural effusion or pneumothorax is seen. The cardiomediastinal silhouette is mildly enlarged. The patient is status post median sternotomy. A pacemaker/AICD is noted overlying the left chest wall, with leads ending overlying the right atrium and right ventricle. A left ventricular assist device is noted. The patient's right IJ line is noted ending about the mid to distal SVC. No acute osseous abnormalities are seen. IMPRESSION: Lungs mildly hypoexpanded. Patchy bilateral airspace opacities may reflect mild interstitial edema or pneumonia. Mild cardiomegaly. Electronically Signed   By:  Garald Balding M.D.   On: 02/05/2016 03:30    Assessment/Plan: Improvinig   LOS: 5 days  Continue to mobilize. Pt hoping to work on bowels today.    Verdis Prime 02/06/2016, 7:42 AM

## 2016-02-06 NOTE — Care Management Important Message (Signed)
Important Message  Patient Details  Name: [name] MRN: [id number] Date of Birth: Dec 26, 1940   Medicare Important Message Given:  Yes    Delorse Lek 02/06/2016, 12:39 PM

## 2016-02-07 DIAGNOSIS — D649 Anemia, unspecified: Secondary | ICD-10-CM

## 2016-02-07 LAB — IRON AND TIBC
Iron: 19 ug/dL — ABNORMAL LOW (ref 45–182)
Saturation Ratios: 10 % — ABNORMAL LOW (ref 17.9–39.5)
TIBC: 189 ug/dL — ABNORMAL LOW (ref 250–450)
UIBC: 170 ug/dL

## 2016-02-07 LAB — CBC
HEMATOCRIT: 24.7 % — AB (ref 39.0–52.0)
HEMOGLOBIN: 7.7 g/dL — AB (ref 13.0–17.0)
MCH: 28.7 pg (ref 26.0–34.0)
MCHC: 31.2 g/dL (ref 30.0–36.0)
MCV: 92.2 fL (ref 78.0–100.0)
Platelets: 133 10*3/uL — ABNORMAL LOW (ref 150–400)
RBC: 2.68 MIL/uL — AB (ref 4.22–5.81)
RDW: 17.4 % — ABNORMAL HIGH (ref 11.5–15.5)
WBC: 5.7 10*3/uL (ref 4.0–10.5)

## 2016-02-07 LAB — BASIC METABOLIC PANEL
Anion gap: 8 (ref 5–15)
BUN: 22 mg/dL — ABNORMAL HIGH (ref 6–20)
CHLORIDE: 101 mmol/L (ref 101–111)
CO2: 26 mmol/L (ref 22–32)
CREATININE: 1.2 mg/dL (ref 0.61–1.24)
Calcium: 8.5 mg/dL — ABNORMAL LOW (ref 8.9–10.3)
GFR calc Af Amer: >60 mL/min (ref >60)
GFR calc non Af Amer: 57 mL/min — ABNORMAL LOW (ref >60)
Glucose, Bld: 98 mg/dL (ref 65–99)
POTASSIUM: 4.4 mmol/L (ref 3.5–5.1)
SODIUM: 135 mmol/L (ref 135–145)

## 2016-02-07 LAB — PREPARE RBC (CROSSMATCH)

## 2016-02-07 LAB — VITAMIN B12: Vitamin B-12: 377 pg/mL (ref 180–914)

## 2016-02-07 LAB — FERRITIN: Ferritin: 323 ng/mL (ref 24–336)

## 2016-02-07 LAB — PROTIME-INR
INR: 1.61 — AB (ref 0.00–1.49)
PROTHROMBIN TIME: 19.2 s — AB (ref 11.6–15.2)

## 2016-02-07 LAB — RETICULOCYTES
RBC.: 2.66 MIL/uL — AB (ref 4.22–5.81)
RETIC COUNT ABSOLUTE: 77.1 10*3/uL (ref 19.0–186.0)
Retic Ct Pct: 2.9 % (ref 0.4–3.1)

## 2016-02-07 LAB — FOLATE: Folate: 23 ng/mL (ref >5.9)

## 2016-02-07 LAB — HEPARIN LEVEL (UNFRACTIONATED): HEPARIN UNFRACTIONATED: 0.26 [IU]/mL — AB (ref 0.30–0.70)

## 2016-02-07 LAB — LACTATE DEHYDROGENASE: LDH: 203 U/L — AB (ref 98–192)

## 2016-02-07 MED ORDER — SORBITOL 70 % SOLN
30.0000 mL | Freq: Every day | Status: DC | PRN
  Administered 2016-02-08: 30 mL via ORAL
  Filled 2016-02-07: qty 30

## 2016-02-07 MED ORDER — WARFARIN SODIUM 2 MG PO TABS
4.0000 mg | ORAL_TABLET | Freq: Once | ORAL | Status: AC
Start: 2016-02-07 — End: 2016-02-07
  Administered 2016-02-07: 4 mg via ORAL
  Filled 2016-02-07: qty 1
  Filled 2016-02-07 (×2): qty 2

## 2016-02-07 MED ORDER — LEVOTHYROXINE SODIUM 100 MCG PO TABS
200.0000 ug | ORAL_TABLET | ORAL | Status: DC
  Administered 2016-02-08 – 2016-02-10 (×3): 200 ug via ORAL
  Filled 2016-02-07 (×3): qty 2

## 2016-02-07 MED ORDER — SORBITOL 70 % SOLN
30.0000 mL | Freq: Once | Status: AC
  Administered 2016-02-07: 30 mL via ORAL
  Filled 2016-02-07: qty 30

## 2016-02-07 MED ORDER — SODIUM CHLORIDE 0.9 % IV SOLN
Freq: Once | INTRAVENOUS | Status: AC
  Administered 2016-02-07: 13:00:00 via INTRAVENOUS

## 2016-02-07 MED ORDER — SODIUM CHLORIDE 0.9 % IV SOLN
510.0000 mg | INTRAVENOUS | Status: DC
  Administered 2016-02-07: 510 mg via INTRAVENOUS
  Filled 2016-02-07 (×2): qty 17

## 2016-02-07 NOTE — Progress Notes (Addendum)
ANTICOAGULATION CONSULT NOTE - Follow Up Consult  Pharmacy Consult for heparin, coumadin Indication: LVAD  Allergies  Allergen Reactions  . Demerol [Meperidine] Other (See Comments)    Paralysis. Could only move eyes.    Patient Measurements: Height: 6' (182.9 cm) Weight: 192 lb 9.6 oz (87.363 kg) IBW/kg (Calculated) : 77.6  Vital Signs: Temp: 98 F (36.7 C) (04/04 0800) Temp Source: Oral (04/04 0800) Pulse Rate: 60 (04/04 0800)  Labs:  Recent Labs  02/05/16 0442 02/05/16 1808 02/06/16 0335 02/07/16 0345  HGB 9.1*  --  8.3* 7.7*  HCT 29.4*  --  26.5* 24.7*  PLT 129*  --  132* 133*  LABPROT 19.5*  --  19.2* 19.2*  INR 1.65*  --  1.61* 1.61*  HEPARINUNFRC 0.30 0.33 0.33 0.26*  CREATININE 1.41*  --  1.28* 1.20    Estimated Creatinine Clearance: 57.5 mL/min (by C-G formula based on Cr of 1.2).  Assessment: 74yom on coumadin pta for his LVAD, admitted for elective back surgery. Coumadin held and he was bridged with IV heparin. He is now s/p surgery and restarted on IV heparin. No bleeding or problems noted..  Heparin drip rate 1300 units/hr, heparin level slightly below goal at 0.26, but spoke with Dr. Haroldine Laws, given decreasing Hgb will keep gtt rate the same for now.  Will adjust goal HL to 0.2-0.5 INR remains < goal at 1.6, but has stopped falling Plan to restart clopidogrel when INR > 2  Goal of Therapy:  INR 2-2.5, Heparin level 0.2-0.5 Monitor platelets by anticoagulation protocol: Yes   Plan:  Continue heparin gtt at 1300 units/hr Warfarin 4mg  x1 Daily CBC, HL, INR  Uvaldo Rising, BCPS  Clinical Pharmacist Pager [phone]  02/07/2016 10:15 AM

## 2016-02-07 NOTE — Progress Notes (Signed)
Patient has some bruising in paraspinal soft tissues along with hematoma under incision.  Patient has no neurologic deficits with full strength in both legs.  He is more sore in his back today than yesterday.  I discussed this with Dr. Haroldine Laws and he will hold his heparin and continue coumadin.  Patient's nurse will apply a pressure dressing to his back and continue to monitor patient.  Patient has received 2 units of blood for declining hematocrit.  Will observe.

## 2016-02-07 NOTE — Progress Notes (Signed)
Transferred from [place] by bed with the first unit of blood infusing well.

## 2016-02-07 NOTE — Care Management Note (Signed)
Case Management Note Marvetta Gibbons RN, BSN Unit 2W-Case Manager [phone]  Patient Details  Name: CRIS LONERO MRN: [id number] Date of Birth: 23-Feb-1941  Subjective/Objective:    Pt admitted for L3 to L5 fusion on 02/03/16 with Dr Erline Levine. Coumadin will be held and he will need heparin bridging to surgery- hx LVAD                Action/Plan: PTA pt lived at home with wife- Pt is no longer receiving HH.  CM to follow for d/c needs post op  Expected Discharge Date:                  Expected Discharge Plan:  [place]  In-House Referral:     Discharge planning Services  CM Consult  Post Acute Care Choice:  Home Health Choice offered to:     DME Arranged:    DME Agency:     HH Arranged:    HH Agency:     Status of Service:  In process, will continue to follow  Medicare Important Message Given:  Yes Date Medicare IM Given:    Medicare IM give by:    Date Additional Medicare IM Given:    Additional Medicare Important Message give by:     If discussed at [place] of Stay Meetings, dates discussed:    Additional Comments:  [name], RN 02/07/2016, 1:30 PM

## 2016-02-07 NOTE — Progress Notes (Signed)
PT Cancellation Note  Patient Details Name: [name] MRN: [id number] DOB: 02-12-1941   Cancelled Treatment:    Reason Eval/Treat Not Completed: Medical issues which prohibited therapy (Pt nauseous and Hgb low awaiting transfusion per nurse HOLD PT today.) Will return tomorrow.  Thanks.   [name] 02/07/2016, 11:43 AM [facility] [phone] (pager)

## 2016-02-07 NOTE — Progress Notes (Signed)
Subjective: Patient reports "I'm on  my second one (unit of blood)"..."My back hurts, but I know it's going to at times"  Objective: Vital signs in last 24 hours: Temp:  [98 F (36.7 C)-98.6 F (37 C)] 98.6 F (37 C) (04/04 1245) Pulse Rate:  [59-60] 59 (04/04 1245) Resp:  [8-18] 9 (04/04 1245) SpO2:  [98 %-100 %] 98 % (04/04 1245) Weight:  [87.363 kg (192 lb 9.6 oz)] 87.363 kg (192 lb 9.6 oz) (04/04 0300)  Intake/Output from previous day: 04/03 0701 - 04/04 0700 In: 792 [P.O.:480; I.V.:312] Out: 870 [Urine:870] Intake/Output this shift: Total I/O In: 176 [P.O.:120; I.V.:26; Blood:30] Out: 200 [Urine:200]  Alert, eating lunch. Conversant, with positive outlook. Good strength BLE. No leg pain. Endurance low, not OOB today with Hgb low. Cardiology managing Hgb & anticoagulation.    Lab Results:  Recent Labs  02/06/16 0335 02/07/16 0345  WBC 8.0 5.7  HGB 8.3* 7.7*  HCT 26.5* 24.7*  PLT 132* 133*   BMET  Recent Labs  02/06/16 0335 02/07/16 0345  NA 135 135  K 4.7 4.4  CL 104 101  CO2 25 26  GLUCOSE 101* 98  BUN 21* 22*  CREATININE 1.28* 1.20  CALCIUM 8.6* 8.5*    Studies/Results: No results found.  Assessment/Plan:   LOS: 6 days  Pt preparing for transfer to stepdown. Anticoagulation managed by cardiology.    Verdis Prime 02/07/2016, 2:27 PM

## 2016-02-07 NOTE — Progress Notes (Signed)
HeartMate 2 Rounding Note  Subjective:    S/P L3 to L5 Fusion 02/03/16  Working with PT/OT. Ambulated 50'. Has not had BM since admit . Urine dark.   Nauseated this morning. Complaining of fatigue.   INR 1.61 this am. On heparin coumadin. Hgb trending down. 9.1>8.3>7.7  LVAD INTERROGATION:  HeartMate II LVAD:  Flow 4.2 liters/min, speed 9000, power 4.8, PI 5.8 2 Rare PI   Objective:    Vital Signs:   Temp:  [98.1 F (36.7 C)-98.6 F (37 C)] 98.6 F (37 C) (04/04 0400) Pulse Rate:  [59] 59 (04/04 0000) Resp:  [8-20] 8 (04/04 0000) SpO2:  [96 %-100 %] 100 % (04/04 0000) Weight:  [192 lb 9.6 oz (87.363 kg)] 192 lb 9.6 oz (87.363 kg) (04/04 0300) Last BM Date: 02/02/16 Mean arterial Pressure 70-80s   Intake/Output:   Intake/Output Summary (Last 24 hours) at 02/07/16 0715 Last data filed at 02/07/16 0700  Gross per 24 hour  Intake    792 ml  Output    650 ml  Net    142 ml     Physical Exam: General:In bed  NAD. Pale HEENT: normal Neck: supple. RIJ TLC. JVP 9-10 cm. R CEA site C/D/I Carotids 2+ bilat; no bruits. No thyromegaly or nodule noted. Cor: Mechanical heart sounds with LVAD hum present. Lungs: CTAB, normal effort Abdomen: soft, NT, ND, no HSM. No bruits or masses. +BS Driveline: C/D/I; securement device intact and driveline incorporated Extremities: no cyanosis, clubbing, rash, tr edema Neuro: alert & orientedx3, cranial nerves grossly intact. moves all 4 extremities w/o difficulty. Affect pleasant  Telemetry: Reviewed, A pacing 50-60s  Labs: Basic Metabolic Panel:  Recent Labs Lab 02/01/16 1646 02/03/16 0914 02/04/16 0506 02/05/16 0442 02/06/16 0335 02/07/16 0345  NA 136 138 136 135 135 135  K 4.2 4.1 4.7 4.3 4.7 4.4  CL 105  --  107 104 104 101  CO2 22  --  22 25 25 26   GLUCOSE 121*  --  120* 107* 101* 98  BUN 22*  --  16 19 21* 22*  CREATININE 1.47*  --  1.17 1.41* 1.28* 1.20  CALCIUM 8.8*  --  8.9 8.6* 8.6* 8.5*    Liver Function  Tests:  Recent Labs Lab 02/01/16 1646  AST 30  ALT 19  ALKPHOS 123  BILITOT 0.6  PROT 6.4*  ALBUMIN 3.3*   No results for input(s): LIPASE, AMYLASE in the last 168 hours. No results for input(s): AMMONIA in the last 168 hours.  CBC:  Recent Labs Lab 02/03/16 1840 02/04/16 0506 02/04/16 2317 02/05/16 0442 02/06/16 0335 02/07/16 0345  WBC 8.2 10.2 9.0 8.9 8.0 5.7  NEUTROABS 7.2  --   --   --   --   --   HGB 9.7* 9.8* 9.4* 9.1* 8.3* 7.7*  HCT 29.9* 30.3* 28.5* 29.4* 26.5* 24.7*  MCV 89.3 89.6 91.6 91.9 92.3 92.2  PLT 136* 131* 123* 129* 132* 133*    INR:  Recent Labs Lab 02/03/16 0340 02/04/16 0506 02/05/16 0442 02/06/16 0335 02/07/16 0345  INR 1.63* 1.42 1.65* 1.61* 1.61*    Other results:    Imaging: No results found.   Medications:     Scheduled Medications: . sodium chloride   Intravenous Once  . amiodarone  200 mg Oral Daily  . amLODipine  10 mg Oral QHS  . antiseptic oral rinse  7 mL Mouth Rinse BID  . atorvastatin  80 mg Oral Daily  . docusate  sodium  100 mg Oral BID  . ferrous gluconate  324 mg Oral BID WC  . guaiFENesin  600 mg Oral BID  . levothyroxine  200-300 mcg Oral Once per day on Sun Tue Wed Thu Fri Sat  . levothyroxine  300 mcg Oral Once per day on Mon  . metoprolol succinate  25 mg Oral BID  . pantoprazole  40 mg Oral BID  . ranolazine  500 mg Oral BID  . sodium chloride flush  3 mL Intravenous Q12H  . traMADol  50 mg Oral Q8H  . Warfarin - Pharmacist Dosing Inpatient   Does not apply q1800    Infusions: . sodium chloride    . dextrose 5 % and 0.45 % NaCl with KCl 20 mEq/L Stopped (02/04/16 0900)  . heparin 1,300 Units/hr (02/06/16 0731)  . norepinephrine (LEVOPHED) Adult infusion Stopped (02/03/16 1547)    PRN Medications: acetaminophen **OR** acetaminophen, alum & mag hydroxide-simeth, bisacodyl, bisacodyl, docusate sodium, furosemide, HYDROcodone-acetaminophen, HYDROmorphone (DILAUDID) injection, magnesium hydroxide,  menthol-cetylpyridinium **OR** phenol, methocarbamol **OR** methocarbamol (ROBAXIN)  IV, nitroGLYCERIN, ondansetron (ZOFRAN) IV, oxyCODONE-acetaminophen, polyethylene glycol, promethazine, sodium chloride flush, sodium phosphate   Assessment:   1. Debilitating back pain due to lumbar compression fracture - Major limiting factor of his functional status    --S/p L3 to L5 Fusion 02/03/16 2. 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 PAF/AFL with RVR s/p TEEdccv on 01/19/16 4. CKD stage III 5. 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 Now on Plavix and warfarin at home.  6. High-grade R internal carotid stenosis - s/p R CEA 01/13/16 - Had f/u 3/28. Stable.   Plan/Discussion:    Doing well post-op. MAPs ok.  On heparin and coumadin. INR 1.61. Hgb trending down. 9.1>8.3>7.7. No obvious source. Give 2UPRBCs.  Check Iron Panel. Stop po iron.. On PPI , iron, and stool softner.    Maintaining NSR on low dose amio/ranexa.    PT following. Has rolling walker at home. Ambulated 15'  I reviewed the LVAD parameters from today, and compared the results to the patient's prior recorded data.  No programming changes were made.  The LVAD is functioning within specified parameters.  The patient performs LVAD self-test daily.  LVAD interrogation was negative for any significant power changes, alarms or PI events/speed drops.  LVAD equipment check completed and is in good working order.  Back-up equipment present.   LVAD education done on emergency procedures and precautions and reviewed exit site care.  Transfer to stepdown.   Length of Stay: 6  Amy Clegg NP-C  02/07/2016, 7:15 AM  VAD Team --- VAD ISSUES ONLY--- Pager [phone] (7am - 7am)  Advanced Heart Failure Team  Pager [phone] (M-F; 7a - 4p)  Please contact [facility] for night-coverage after hours (4p -7a ) and weekends on [email or web address]  Patient seen and examined with Darrick Grinder, NP. We discussed all aspects of the encounter. I agree  with the assessment and plan as stated above.   He is weak today. Hgb down. + nausea and constipation. Will transfuse 2 units RBCs. Give sorbitol. Volume status and VAD parameters look good. Remains in NSR on amio and ranexa. Continue heparin/wrafarin.   Aleka Twitty,MD 8:31 AM

## 2016-02-07 NOTE — Progress Notes (Signed)
OT Cancellation Note  Patient Details Name: [name] MRN: [id number] DOB: [birth year]   Cancelled Treatment:    Reason Eval/Treat Not Completed: Medical issues which prohibited therapy. Pt with persistent nausea, weakness and awaiting blood. Will continue to follow.  [name] 02/07/2016, 11:38 AM

## 2016-02-07 NOTE — Progress Notes (Signed)
Pressure dressing applied to right lower back which is puffy and swollen, continue to monitor.

## 2016-02-08 LAB — CBC
HEMATOCRIT: 29.3 % — AB (ref 39.0–52.0)
Hemoglobin: 9.4 g/dL — ABNORMAL LOW (ref 13.0–17.0)
MCH: 29.2 pg (ref 26.0–34.0)
MCHC: 32.1 g/dL (ref 30.0–36.0)
MCV: 91 fL (ref 78.0–100.0)
Platelets: 142 10*3/uL — ABNORMAL LOW (ref 150–400)
RBC: 3.22 MIL/uL — ABNORMAL LOW (ref 4.22–5.81)
RDW: 17.5 % — AB (ref 11.5–15.5)
WBC: 6.1 10*3/uL (ref 4.0–10.5)

## 2016-02-08 LAB — PROTIME-INR
INR: 1.73 — AB (ref 0.00–1.49)
Prothrombin Time: 20.2 seconds — ABNORMAL HIGH (ref 11.6–15.2)

## 2016-02-08 LAB — TYPE AND SCREEN
ABO/RH(D): O POS
ANTIBODY SCREEN: NEGATIVE
UNIT DIVISION: 0
Unit division: 0

## 2016-02-08 LAB — BASIC METABOLIC PANEL
Anion gap: 8 (ref 5–15)
BUN: 18 mg/dL (ref 6–20)
CALCIUM: 8.7 mg/dL — AB (ref 8.9–10.3)
CO2: 26 mmol/L (ref 22–32)
CREATININE: 1.13 mg/dL (ref 0.61–1.24)
Chloride: 102 mmol/L (ref 101–111)
GFR calc Af Amer: >60 mL/min (ref >60)
GFR calc non Af Amer: >60 mL/min (ref >60)
GLUCOSE: 96 mg/dL (ref 65–99)
Potassium: 4.9 mmol/L (ref 3.5–5.1)
Sodium: 136 mmol/L (ref 135–145)

## 2016-02-08 LAB — LACTATE DEHYDROGENASE: LDH: 219 U/L — AB (ref 98–192)

## 2016-02-08 LAB — HEPARIN LEVEL (UNFRACTIONATED): Heparin Unfractionated: <0.1 IU/mL — ABNORMAL LOW (ref 0.30–0.70)

## 2016-02-08 MED ORDER — WARFARIN SODIUM 2 MG PO TABS
3.0000 mg | ORAL_TABLET | Freq: Once | ORAL | Status: AC
  Administered 2016-02-08: 3 mg via ORAL
  Filled 2016-02-08: qty 1.5

## 2016-02-08 NOTE — Progress Notes (Signed)
Occupational Therapy Treatment Patient Details Name: [name] MRN: [id number] DOB: 1941-04-06 Today's Date: 02/08/2016    History of present illness 75 yo male admitted 02/01/16 with L4 compression fracture, osteonecrosis, and intractable back pain.  Patient now s/p L3-5 fusion.   PMH:  CHF, LVAD 2014, PAD, ICM, s/p Rt CEA, Flutter with RVR s/p DCCV, CVA, PAF, CAD, MI, HTN, CKD, PVD, HLD   OT comments  Improved activity tolerance and ambulation distance. One episode of nausea following activity. Pt needing reminders for back precautions with bed mobility. Pt eager to go home.  Follow Up Recommendations  No OT follow up    Equipment Recommendations  None recommended by OT    Recommendations for Other Services      Precautions / Restrictions Precautions Precautions: Back;Fall Precaution Comments: pt needing cues for back precautions with bed mobility Restrictions Weight Bearing Restrictions: No       Mobility Bed Mobility Overal bed mobility: Needs Assistance Bed Mobility: Rolling;Sidelying to Sit Rolling: Supervision Sidelying to sit: Supervision       General bed mobility comments: no physical assist, use of rail, verbal cues for log roll technique  Transfers   Equipment used: Rolling walker (2 wheeled) Transfers: Sit to/from Stand Sit to Stand: Min guard         General transfer comment: Min guard for safety, no physical assist required. VCs for hand placement when cojming to standing    Balance             Standing balance-Leahy Scale: Fair                     ADL Overall ADL's : Needs assistance/impaired     Grooming: Brushing hair;Sitting;Set up                   Toilet Transfer: Supervision/safety;Ambulation;RW           Functional mobility during ADLs: Supervision/safety;Rolling walker;+2 for safety/equipment (followed with chair) General ADL Comments: pt with one episode of nausea following ambulation       Vision                     Perception     Praxis      Cognition   Behavior During Therapy: WFL for tasks assessed/performed Overall Cognitive Status: Within Functional Limits for tasks assessed                       Extremity/Trunk Assessment               Exercises     Shoulder Instructions       General Comments      Pertinent Vitals/ Pain       Pain Assessment: 0-10 Pain Score: 4  Pain Location: L hip Pain Intervention(s): Monitored during session;Repositioned  Home Living                                          Prior Functioning/Environment              Frequency Min 2X/week     Progress Toward Goals  OT Goals(current goals can now be found in the care plan section)  Progress towards OT goals: Progressing toward goals  Acute Rehab OT Goals Patient Stated Goal:  To attend the heart walk in MAY and go  to the reunion for the LVAD patients in [place] . [name] reports he is number 26 out 40 completed here at [facility] Time For Goal Achievement: 02/19/16 Potential to Achieve Goals: Good  Plan Discharge plan remains appropriate    Co-evaluation    PT/OT/SLP Co-Evaluation/Treatment: Yes Reason for Co-Treatment: For patient/therapist safety   OT goals addressed during session: ADL's and self-care      End of Session     Activity Tolerance Patient tolerated treatment well (VSS on RA)   Patient Left in chair;with call bell/phone within reach   Nurse Communication Mobility status        Time: [id number] OT Time Calculation (min): 33 min  Charges: OT General Charges $OT Visit: 1 Procedure OT Treatments $Self Care/Home Management : 8-22 mins  [name] 02/08/2016, 10:40 AM  [phone]

## 2016-02-08 NOTE — Progress Notes (Signed)
HeartMate 2 Rounding Note  Subjective:    S/P L3 to L5 Fusion 02/03/16  Got 2 units of RBCs yesterday due to hematoma at operative site. Heparin off.  Hgb 9.4. INR 1.7  Feels better. Still no BM   LVAD INTERROGATION:  HeartMate II LVAD:  Flow 4.3 liters/min, speed 9000, power 5.0, PI 6.5 Rare PI   Objective:    Vital Signs:   Temp:  [97.3 F (36.3 C)-99.3 F (37.4 C)] 97.8 F (36.6 C) (04/05 0400) Pulse Rate:  [58-61] 60 (04/05 0400) Resp:  [9-22] 9 (04/05 0400) SpO2:  [95 %-100 %] 95 % (04/05 0400) Weight:  [88.451 kg (195 lb)] 88.451 kg (195 lb) (04/05 0300) Last BM Date: 02/02/16 Mean arterial Pressure 70s   Intake/Output:   Intake/Output Summary (Last 24 hours) at 02/08/16 0558 Last data filed at 02/08/16 0400  Gross per 24 hour  Intake   1676 ml  Output    745 ml  Net    931 ml     Physical Exam: General:In bed  NAD. Pale HEENT: normal Neck: supple. RIJ TLC. JVP 9-10 cm. R CEA site C/D/I Carotids 2+ bilat; no bruits. No thyromegaly or nodule noted. Cor: Mechanical heart sounds with LVAD hum present. Lungs: CTAB, normal effort Abdomen: soft, NT, ND, no HSM. No bruits or masses. +BS Driveline: C/D/I; securement device intact and driveline incorporated Extremities: no cyanosis, clubbing, rash, tr edema Neuro: alert & orientedx3, cranial nerves grossly intact. moves all 4 extremities w/o difficulty. Affect pleasant  Telemetry: Reviewed, A pacing 50-60s  Labs: Basic Metabolic Panel:  Recent Labs Lab 02/04/16 0506 02/05/16 0442 02/06/16 0335 02/07/16 0345 02/08/16 0345  NA 136 135 135 135 136  K 4.7 4.3 4.7 4.4 4.9  CL 107 104 104 101 102  CO2 22 25 25 26 26   GLUCOSE 120* 107* 101* 98 96  BUN 16 19 21* 22* 18  CREATININE 1.17 1.41* 1.28* 1.20 1.13  CALCIUM 8.9 8.6* 8.6* 8.5* 8.7*    Liver Function Tests:  Recent Labs Lab 02/01/16 1646  AST 30  ALT 19  ALKPHOS 123  BILITOT 0.6  PROT 6.4*  ALBUMIN 3.3*   No results for input(s):  LIPASE, AMYLASE in the last 168 hours. No results for input(s): AMMONIA in the last 168 hours.  CBC:  Recent Labs Lab 02/03/16 1840  02/04/16 2317 02/05/16 0442 02/06/16 0335 02/07/16 0345 02/08/16 0345  WBC 8.2  < > 9.0 8.9 8.0 5.7 6.1  NEUTROABS 7.2  --   --   --   --   --   --   HGB 9.7*  < > 9.4* 9.1* 8.3* 7.7* 9.4*  HCT 29.9*  < > 28.5* 29.4* 26.5* 24.7* 29.3*  MCV 89.3  < > 91.6 91.9 92.3 92.2 91.0  PLT 136*  < > 123* 129* 132* 133* 142*  < > = values in this interval not displayed.  INR:  Recent Labs Lab 02/04/16 0506 02/05/16 0442 02/06/16 0335 02/07/16 0345 02/08/16 0345  INR 1.42 1.65* 1.61* 1.61* 1.73*    Other results:    Imaging: No results found.   Medications:     Scheduled Medications: . amiodarone  200 mg Oral Daily  . amLODipine  10 mg Oral QHS  . antiseptic oral rinse  7 mL Mouth Rinse BID  . atorvastatin  80 mg Oral Daily  . docusate sodium  100 mg Oral BID  . ferumoxytol  510 mg Intravenous Weekly  . guaiFENesin  600 mg Oral BID  . levothyroxine  200 mcg Oral Once per day on Sun Tue Wed Thu Fri Sat  . levothyroxine  300 mcg Oral Once per day on Mon  . metoprolol succinate  25 mg Oral BID  . pantoprazole  40 mg Oral BID  . ranolazine  500 mg Oral BID  . sodium chloride flush  3 mL Intravenous Q12H  . traMADol  50 mg Oral Q8H  . Warfarin - Pharmacist Dosing Inpatient   Does not apply q1800    Infusions: . sodium chloride    . dextrose 5 % and 0.45 % NaCl with KCl 20 mEq/L Stopped (02/04/16 0900)  . norepinephrine (LEVOPHED) Adult infusion Stopped (02/03/16 1547)    PRN Medications: acetaminophen **OR** acetaminophen, alum & mag hydroxide-simeth, bisacodyl, bisacodyl, docusate sodium, furosemide, HYDROcodone-acetaminophen, HYDROmorphone (DILAUDID) injection, magnesium hydroxide, menthol-cetylpyridinium **OR** phenol, methocarbamol **OR** methocarbamol (ROBAXIN)  IV, nitroGLYCERIN, ondansetron (ZOFRAN) IV, oxyCODONE-acetaminophen,  polyethylene glycol, promethazine, sodium chloride flush, sodium phosphate, sorbitol   Assessment:   1. Debilitating back pain due to lumbar compression fracture - Major limiting factor of his functional status    --S/p L3 to L5 Fusion 02/03/16 2. 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 PAF/AFL with RVR s/p TEEdccv on 01/19/16 4. CKD stage III 5. 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 Now on Plavix and warfarin at home.  6. High-grade R internal carotid stenosis - s/p R CEA 01/13/16 - Had f/u 3/28. Stable.  7. Symptomatic anemia/hematoa at operative site - s/p 2u RBCs 4/3  Plan/Discussion:    Heparin stopped yesterday due to hematoma at operative site. Given 2u  RBCs. Discussed with Dr. Vertell Limber.   Feeling better. INR 1.7    Maintaining NSR on low dose amio/ranexa.    PT following. Ambulating with assistance.  Still constipated. Will give sorbitol.   Volume up slightly after transfusion. May need a dose of lasix.   I reviewed the LVAD parameters from today, and compared the results to the patient's prior recorded data.  No programming changes were made.  The LVAD is functioning within specified parameters.  The patient performs LVAD self-test daily.  LVAD interrogation was negative for any significant power changes, alarms or PI events/speed drops.  LVAD equipment check completed and is in good working order.  Back-up equipment present.   LVAD education done on emergency procedures and precautions and reviewed exit site care.  Transfer to stepdown.   Length of Stay: 7  Orpah Hausner MD  02/08/2016, 5:58 AM  VAD Team --- VAD ISSUES ONLY--- Pager [phone] (7am - 7am)  Advanced Heart Failure Team  Pager [phone] (M-F; 7a - 4p)  Please contact [facility] for night-coverage after hours (4p -7a ) and weekends on [email or web address]

## 2016-02-08 NOTE — Progress Notes (Addendum)
ANTICOAGULATION CONSULT NOTE - Follow Up Consult  Pharmacy Consult for heparin, coumadin Indication: LVAD  Allergies  Allergen Reactions  . Demerol [Meperidine] Other (See Comments)    Paralysis. Could only move eyes.    Patient Measurements: Height: 6' (182.9 cm) Weight: 193 lb 5.5 oz (87.7 kg) IBW/kg (Calculated) : 77.6  Vital Signs: Temp: 97.4 F (36.3 C) (04/05 0757) Temp Source: Oral (04/05 0757) Pulse Rate: 60 (04/05 0920)  Labs:  Recent Labs  02/06/16 0335 02/07/16 0345 02/08/16 0345  HGB 8.3* 7.7* 9.4*  HCT 26.5* 24.7* 29.3*  PLT 132* 133* 142*  LABPROT 19.2* 19.2* 20.2*  INR 1.61* 1.61* 1.73*  HEPARINUNFRC 0.33 0.26* <0.10*  CREATININE 1.28* 1.20 1.13    Estimated Creatinine Clearance: 61 mL/min (by C-G formula based on Cr of 1.13).  Assessment: 74yom on coumadin pta for his LVAD, admitted for elective back surgery. Coumadin held and he was bridged with IV heparin. He is now s/p surgery and was restarted on IV heparin. Surgical site looked swollen last night and IV heparin was stopped.  Hgb improved s/p 2 units PRBCs yesterday. INR remains < goal at 1.7, but starting to rise. Plan to restart clopidogrel when INR > 2  Goal of Therapy:  INR 2-2.5 Monitor platelets by anticoagulation protocol: Yes   Plan:  Warfarin 3 mg x1 Daily  INR  Uvaldo Rising, BCPS  Clinical Pharmacist Pager [phone]  02/08/2016 9:30 AM

## 2016-02-08 NOTE — Progress Notes (Signed)
Physical Therapy Treatment Patient Details Name: [name] MRN: [id number] DOB: 10/12/1941 Today's Date: 02/08/2016    History of Present Illness 75 yo male admitted 02/01/16 with L4 compression fracture, osteonecrosis, and intractable back pain.  Patient now s/p L3-5 fusion.   PMH:  CHF, LVAD 2014, PAD, ICM, s/p Rt CEA, Flutter with RVR s/p DCCV, CVA, PAF, CAD, MI, HTN, CKD, PVD, HLD    PT Comments    Patient demonstrates overall improved activity tolerance and mobility today. Ambulated increased distance with no physical assist and minimal cues. Progressing well towards PT goals at this time, will continue to see and progress as tolerated.   Follow Up Recommendations  No PT follow up;Supervision - Intermittent     Equipment Recommendations  None recommended by PT    Recommendations for Other Services       Precautions / Restrictions Precautions Precautions: Back;Fall Precaution Comments: pt needing cues for back precautions with bed mobility Restrictions Weight Bearing Restrictions: No    Mobility  Bed Mobility Overal bed mobility: Needs Assistance Bed Mobility: Rolling;Sidelying to Sit Rolling: Supervision Sidelying to sit: Supervision       General bed mobility comments: no physical assist, use of rail, verbal cues for log roll technique  Transfers Overall transfer level: Needs assistance Equipment used: Rolling walker (2 wheeled) Transfers: Sit to/from Stand Sit to Stand: Min guard         General transfer comment: Min guard for safety, no physical assist required. VCs for hand placement when cojming to standing  Ambulation/Gait Ambulation/Gait assistance: Supervision Ambulation Distance (Feet): 300 Feet Assistive device: Rolling walker (2 wheeled) Gait Pattern/deviations: Decreased stride length;Step-through pattern;Narrow base of support Gait velocity: decreased   General Gait Details: Improved cadence this session, increased activity tolerance  with modest instabilty. Some increased felxed posture requiring cues for upright positioning with fatigue   Stairs            Wheelchair Mobility    Modified Rankin (Stroke Patients Only)       Balance             Standing balance-Leahy Scale: Fair                      Cognition Arousal/Alertness: Awake/alert Behavior During Therapy: WFL for tasks assessed/performed Overall Cognitive Status: Within Functional Limits for tasks assessed                      Exercises      General Comments        Pertinent Vitals/Pain Pain Assessment: 0-10 Pain Score: 4  Pain Location: L hip Pain Intervention(s): Monitored during session;Repositioned    Home Living                      Prior Function            PT Goals (current goals can now be found in the care plan section) Acute Rehab PT Goals Patient Stated Goal:  To attend the heart walk in MAY and go to the reunion for the LVAD patients in [place] . [name] reports he is number 19 out 3 completed here at [facility] PT Goal Formulation: With patient/family Time For Goal Achievement: 02/11/16 Potential to Achieve Goals: Good Progress towards PT goals: Progressing toward goals    Frequency  Min 5X/week    PT Plan Current plan remains appropriate    Co-evaluation   Reason for Co-Treatment: For patient/therapist safety  PT goals addressed during session: Mobility/safety with mobility OT goals addressed during session: ADL's and self-care     End of Session Equipment Utilized During Treatment: Oxygen Activity Tolerance: Patient limited by fatigue;Patient limited by pain (Limited by nausea) Patient left: in chair;with call bell/phone within reach;with family/visitor present     Time: [id number] PT Time Calculation (min) (ACUTE ONLY): 33 min  Charges:  $Gait Training: 8-22 mins                    G CodesDuncan Dull 2016-02-16, 1:25 PM Alben Deeds, [place] DPT  [phone]

## 2016-02-08 NOTE — Progress Notes (Signed)
Subjective: Patient reports "I'm good!"  Objective: Vital signs in last 24 hours: Temp:  [97.3 F (36.3 C)-99.3 F (37.4 C)] 97.4 F (36.3 C) (04/05 0757) Pulse Rate:  [58-61] 60 (04/05 0920) Resp:  [9-22] 16 (04/05 0819) SpO2:  [95 %-100 %] 96 % (04/05 0819) Weight:  [87.7 kg (193 lb 5.5 oz)] 87.7 kg (193 lb 5.5 oz) (04/05 0600)  Intake/Output from previous day: 04/04 0701 - 04/05 0700 In: [id number] [P.O.:800; I.V.:68; Blood:665; IV Piggyback:117] Out: 825 [Urine:825] Intake/Output this shift: Total I/O In: 150 [P.O.:150] Out: -   Alert, conversant, smiling. Sitting in chair. Feels better today. Incisions without erythema or drainage. Swelling decreased today. No new areas of bruising. MAEW  Lab Results:  Recent Labs  02/07/16 0345 02/08/16 0345  WBC 5.7 6.1  HGB 7.7* 9.4*  HCT 24.7* 29.3*  PLT 133* 142*   BMET  Recent Labs  02/07/16 0345 02/08/16 0345  NA 135 136  K 4.4 4.9  CL 101 102  CO2 26 26  GLUCOSE 98 96  BUN 22* 18  CREATININE 1.20 1.13  CALCIUM 8.5* 8.7*    Studies/Results: No results found.  Assessment/Plan:    LOS: 7 days  Continue to mobilize. Anticoagulation per Cardiology. Home when appropriate from cardiology standpoint.   Verdis Prime 02/08/2016, 11:20 AM

## 2016-02-08 NOTE — Progress Notes (Signed)
Pressure dressing to right back removed by neuro.RN, site less swollen as yesterday.

## 2016-02-08 NOTE — Progress Notes (Addendum)
Pt. Pump flow is usually between 4 and 5.  It jumped up briefly to 8.2 and went back down.  It is fluctuating now between 4.1 and 7.  Pump speed has remained between 5 and 6.6.  INR 1.7 and LDH 217.  Discussed with LVAD team.  Will continue to monitor closely.

## 2016-02-08 NOTE — Progress Notes (Signed)
Pressure dressing to right lower back in placed.

## 2016-02-08 NOTE — Care Management Note (Signed)
Case Management Note  Patient Details  Name: [name] MRN: [id number] Date of Birth: Mar 11, 1941  Subjective/Objective:   Patient with LVAD was on coumadin pta, transfer from ICU had elective l3-l5 fusion.  Coumadin held and bridged with heparin, restarted heparin, has hematoma at operative site, heparin stopped, transfused 2 units prbc's, bp down, on levophed.  NCM will cont to follow for dc needs.                 Action/Plan:   Expected Discharge Date:                  Expected Discharge Plan:  [place]  In-House Referral:     Discharge planning Services  CM Consult  Post Acute Care Choice:  Home Health Choice offered to:     DME Arranged:    DME Agency:     HH Arranged:    [place] Agency:     Status of Service:  In process, will continue to follow  Medicare Important Message Given:  Yes Date Medicare IM Given:    Medicare IM give by:    Date Additional Medicare IM Given:    Additional Medicare Important Message give by:     If discussed at [place] of Stay Meetings, dates discussed:    Additional Comments:  Zenon Mayo, RN 02/08/2016, 1:06 PM

## 2016-02-09 DIAGNOSIS — M4850XD Collapsed vertebra, not elsewhere classified, site unspecified, subsequent encounter for fracture with routine healing: Secondary | ICD-10-CM

## 2016-02-09 LAB — CBC
HCT: 29.8 % — ABNORMAL LOW (ref 39.0–52.0)
HEMOGLOBIN: 9.5 g/dL — AB (ref 13.0–17.0)
MCH: 29.2 pg (ref 26.0–34.0)
MCHC: 31.9 g/dL (ref 30.0–36.0)
MCV: 91.7 fL (ref 78.0–100.0)
PLATELETS: 162 10*3/uL (ref 150–400)
RBC: 3.25 MIL/uL — ABNORMAL LOW (ref 4.22–5.81)
RDW: 17 % — ABNORMAL HIGH (ref 11.5–15.5)
WBC: 5.9 10*3/uL (ref 4.0–10.5)

## 2016-02-09 LAB — PROTIME-INR
INR: 2.12 — ABNORMAL HIGH (ref 0.00–1.49)
PROTHROMBIN TIME: 23.6 s — AB (ref 11.6–15.2)

## 2016-02-09 LAB — LACTATE DEHYDROGENASE: LDH: 205 U/L — AB (ref 98–192)

## 2016-02-09 MED ORDER — CLOPIDOGREL BISULFATE 75 MG PO TABS
75.0000 mg | ORAL_TABLET | Freq: Every day | ORAL | Status: DC
Start: 2016-02-09 — End: 2016-02-09
  Filled 2016-02-09: qty 1

## 2016-02-09 MED ORDER — WARFARIN SODIUM 2 MG PO TABS
2.0000 mg | ORAL_TABLET | Freq: Once | ORAL | Status: AC
  Administered 2016-02-09: 2 mg via ORAL
  Filled 2016-02-09: qty 1

## 2016-02-09 NOTE — Progress Notes (Signed)
HeartMate 2 Rounding Note  Subjective:    Got 2 units of RBCs yesterday due to hematoma at operative site. Heparin off.  Hgb 9.5 INR 2.1   Complaining of nausea. Had BM. Able to walk a little further.   MAP 80  LVAD INTERROGATION:  HeartMate II LVAD:  Flow 5.3 liters/min, speed 9000, power 5.3, PI 6.2 Rare PI Over night had an episode of flow to 11. No PI was noted.   Objective:    Vital Signs:   Temp:  [97.7 F (36.5 C)-98.5 F (36.9 C)] 97.7 F (36.5 C) (04/06 0351) Pulse Rate:  [57-63] 58 (04/06 0700) Resp:  [0-22] 8 (04/06 0700) SpO2:  [93 %-100 %] 95 % (04/06 0700) Weight:  [192 lb 4.8 oz (87.227 kg)] 192 lb 4.8 oz (87.227 kg) (04/06 0400) Last BM Date: 02/02/16 Mean arterial Pressure 70-80s  Intake/Output:   Intake/Output Summary (Last 24 hours) at 02/09/16 0820 Last data filed at 02/09/16 0400  Gross per 24 hour  Intake    833 ml  Output    925 ml  Net    -92 ml     Physical Exam: General:In bed  NAD. Pale. In bed.  HEENT: normal Neck: supple. RIJ TLC. JVP 7-8  cm. R CEA site C/D/I Carotids 2+ bilat; no bruits. No thyromegaly or nodule noted. Cor: Mechanical heart sounds with LVAD hum present. Lungs: CTAB, normal effort Abdomen: soft, NT, ND, no HSM. No bruits or masses. +BS Driveline: C/D/I; securement device intact and driveline incorporated Extremities: no cyanosis, clubbing, rash, tr edema Neuro: alert & orientedx3, cranial nerves grossly intact. moves all 4 extremities w/o difficulty. Affect pleasant  Telemetry: Reviewed, A pacing 50-60s  Labs: Basic Metabolic Panel:  Recent Labs Lab 02/04/16 0506 02/05/16 0442 02/06/16 0335 02/07/16 0345 02/08/16 0345  NA 136 135 135 135 136  K 4.7 4.3 4.7 4.4 4.9  CL 107 104 104 101 102  CO2 22 25 25 26 26   GLUCOSE 120* 107* 101* 98 96  BUN 16 19 21* 22* 18  CREATININE 1.17 1.41* 1.28* 1.20 1.13  CALCIUM 8.9 8.6* 8.6* 8.5* 8.7*    Liver Function Tests: No results for input(s): AST, ALT,  ALKPHOS, BILITOT, PROT, ALBUMIN in the last 168 hours. No results for input(s): LIPASE, AMYLASE in the last 168 hours. No results for input(s): AMMONIA in the last 168 hours.  CBC:  Recent Labs Lab 02/03/16 1840  02/05/16 0442 02/06/16 0335 02/07/16 0345 02/08/16 0345 02/09/16 0410  WBC 8.2  < > 8.9 8.0 5.7 6.1 5.9  NEUTROABS 7.2  --   --   --   --   --   --   HGB 9.7*  < > 9.1* 8.3* 7.7* 9.4* 9.5*  HCT 29.9*  < > 29.4* 26.5* 24.7* 29.3* 29.8*  MCV 89.3  < > 91.9 92.3 92.2 91.0 91.7  PLT 136*  < > 129* 132* 133* 142* 162  < > = values in this interval not displayed.  INR:  Recent Labs Lab 02/05/16 0442 02/06/16 0335 02/07/16 0345 02/08/16 0345 02/09/16 0410  INR 1.65* 1.61* 1.61* 1.73* 2.12*    Other results:    Imaging: No results found.   Medications:     Scheduled Medications: . amiodarone  200 mg Oral Daily  . amLODipine  10 mg Oral QHS  . antiseptic oral rinse  7 mL Mouth Rinse BID  . atorvastatin  80 mg Oral Daily  . docusate sodium  100 mg Oral  BID  . ferumoxytol  510 mg Intravenous Weekly  . guaiFENesin  600 mg Oral BID  . levothyroxine  200 mcg Oral Once per day on Sun Tue Wed Thu Fri Sat  . levothyroxine  300 mcg Oral Once per day on Mon  . metoprolol succinate  25 mg Oral BID  . pantoprazole  40 mg Oral BID  . ranolazine  500 mg Oral BID  . sodium chloride flush  3 mL Intravenous Q12H  . traMADol  50 mg Oral Q8H  . Warfarin - Pharmacist Dosing Inpatient   Does not apply q1800    Infusions: . sodium chloride    . dextrose 5 % and 0.45 % NaCl with KCl 20 mEq/L Stopped (02/04/16 0900)  . norepinephrine (LEVOPHED) Adult infusion Stopped (02/03/16 1547)    PRN Medications: acetaminophen **OR** acetaminophen, alum & mag hydroxide-simeth, bisacodyl, bisacodyl, docusate sodium, furosemide, HYDROcodone-acetaminophen, HYDROmorphone (DILAUDID) injection, magnesium hydroxide, menthol-cetylpyridinium **OR** phenol, methocarbamol **OR** methocarbamol  (ROBAXIN)  IV, nitroGLYCERIN, ondansetron (ZOFRAN) IV, oxyCODONE-acetaminophen, polyethylene glycol, promethazine, sodium chloride flush, sodium phosphate, sorbitol   Assessment:   1. Debilitating back pain due to lumbar compression fracture - Major limiting factor of his functional status    --S/p L3 to L5 Fusion 02/03/16 2. 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 PAF/AFL with RVR s/p TEEdccv on 01/19/16 4. CKD stage III 5. 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 Now on Plavix and warfarin at home.  6. High-grade R internal carotid stenosis - s/p R CEA 01/13/16 - Had f/u 3/28. Stable.  7. Symptomatic anemia/hematoa at operative site - s/p 2u RBCs 4/3  Plan/Discussion:    Heparin stopped 4/4 due to hematoma at operative site. Received 2URPBCs on 4/3. Hgb stable today. Having some nausea. Try tylenol. Hold off on ultram unless needed to see if that helps with nausea. Continue zofran as needed.   Maintaining NSR on low dose amio/ranexa.    PT following. Ambulating with assistance.  INR 2.1 Coumadin dosed by pharmacy. No Aspirin with H/O GI Bleed.   Volume status ok.   Over night had elevated flow to 11 but PI was captured. Watch for now. INR therapeutic.    I reviewed the LVAD parameters from today, and compared the results to the patient's prior recorded data.  No programming changes were made.  The LVAD is functioning within specified parameters.  The patient performs LVAD self-test daily.  LVAD interrogation was negative for any significant power changes, alarms or PI events/speed drops.  LVAD equipment check completed and is in good working order.  Back-up equipment present.   LVAD education done on emergency procedures and precautions and reviewed exit site care.  Home 24-48 hours.   Length of Stay: [place] NP-C  02/09/2016, 8:20 AM  VAD Team --- VAD ISSUES ONLY--- Pager [phone] (7am - 7am)  Advanced Heart Failure Team  Pager [phone] (M-F; 7a - 4p)    Please contact [facility] for night-coverage after hours (4p -7a ) and weekends on [email or web address]  Patient seen with NP, agree with the above note.  Doing well post-op, walked 300 feet.  Back feels better.  Having some nausea this morning. Had isolated increased flow last night but since then all parameters normal.  Hemoglobin and LDH stable, INR therapeutic.  He can restart home Plavix today at 75 mg daily.    Will check on him again this afternoon, potentially home this afternoon if nausea better.  Loralie Champagne 02/09/2016 9:29 AM

## 2016-02-09 NOTE — Care Management Important Message (Signed)
Important Message  Patient Details  Name: KOLSTEN BESSLER MRN: [id number] Date of Birth: [birth year]   Medicare Important Message Given:  Yes    Barb Merino Bernestine Holsapple 02/09/2016, 12:38 PM

## 2016-02-09 NOTE — Discharge Instructions (Signed)

## 2016-02-09 NOTE — Progress Notes (Signed)
ANTICOAGULATION CONSULT NOTE - Follow Up Consult  Pharmacy Consult for Coumadin Indication: LVAD  Allergies  Allergen Reactions  . Demerol [Meperidine] Other (See Comments)    Paralysis. Could only move eyes.    Patient Measurements: Height: 6' (182.9 cm) Weight: 192 lb 4.8 oz (87.227 kg) IBW/kg (Calculated) : 77.6  Vital Signs: Temp: 97.7 F (36.5 C) (04/06 0351) Temp Source: Oral (04/06 0351) Pulse Rate: 58 (04/06 0700)  Labs:  Recent Labs  02/07/16 0345 02/08/16 0345 02/09/16 0410  HGB 7.7* 9.4* 9.5*  HCT 24.7* 29.3* 29.8*  PLT 133* 142* 162  LABPROT 19.2* 20.2* 23.6*  INR 1.61* 1.73* 2.12*  HEPARINUNFRC 0.26* <0.10*  --   CREATININE 1.20 1.13  --     Estimated Creatinine Clearance: 61 mL/min (by C-G formula based on Cr of 1.13).  Assessment: 74yom on coumadin pta for his LVAD, admitted for elective back surgery. Coumadin held and he was bridged with IV heparin. He is now s/p surgery and was restarted on IV heparin. Surgical site looked swollen last night and IV heparin was stopped.  Hgb stable s/p 2 units PRBCs.  INR now at goal.  Goal of Therapy:  INR 2-2.5 Monitor platelets by anticoagulation protocol: Yes   Plan:  Warfarin 2 mg x1.  If he goes home today, recommend 2 mg daily with 4 mg two days/week.  (Previously on 2 mg daily with 4 mg on Mondays).  INR recheck next week if able.  Uvaldo Rising, BCPS  Clinical Pharmacist Pager [phone]  02/09/2016 8:31 AM

## 2016-02-09 NOTE — Progress Notes (Signed)
PT Cancellation Note  Patient Details Name: [name] MRN: [id number] DOB: 08/11/1941   Cancelled Treatment:    Reason Eval/Treat Not Completed: Medical issues which prohibited therapy (Pt nauseated and asking to hold today)   [name] 02/09/2016, 5:30 PM Alben Deeds, [place] DPT  [phone]

## 2016-02-09 NOTE — Progress Notes (Signed)
Subjective: Patient reports "Good morning!Frank KitchenMarland KitchenMarland KitchenI'm doing well"  Objective: Vital signs in last 24 hours: Temp:  [97.4 F (36.3 C)-98.5 F (36.9 C)] 97.7 F (36.5 C) (04/06 0351) Pulse Rate:  [57-63] 58 (04/06 0700) Resp:  [0-22] 8 (04/06 0700) SpO2:  [93 %-100 %] 95 % (04/06 0700) Weight:  [87.227 kg (192 lb 4.8 oz)] 87.227 kg (192 lb 4.8 oz) (04/06 0400)  Intake/Output from previous day: 04/05 0701 - 04/06 0700 In: 833 [P.O.:830; I.V.:3] Out: 925 [Urine:925] Intake/Output this shift:    Awakens to voice. Conversant & smiling. MAEW with good strength. No new bruising. Mild tenderness persists over lumbar area of swelling, without change since yesterday. Drsgs intact over incisions - no erythema or new drainage. Pt reports lumbar pain well controlled. No leg pain.   Lab Results:  Recent Labs  02/08/16 0345 02/09/16 0410  WBC 6.1 5.9  HGB 9.4* 9.5*  HCT 29.3* 29.8*  PLT 142* 162   BMET  Recent Labs  02/07/16 0345 02/08/16 0345  NA 135 136  K 4.4 4.9  CL 101 102  CO2 26 26  GLUCOSE 98 96  BUN 22* 18  CREATININE 1.20 1.13  CALCIUM 8.5* 8.7*    Studies/Results: No results found.  Assessment/Plan: improving   LOS: 8 days  Doing well from NS standpoint. Home when deemed appropriate by cardiology.   Verdis Prime 02/09/2016, 7:45 AM

## 2016-02-10 DIAGNOSIS — I5032 Chronic diastolic (congestive) heart failure: Secondary | ICD-10-CM

## 2016-02-10 LAB — BASIC METABOLIC PANEL
Anion gap: 9 (ref 5–15)
BUN: 15 mg/dL (ref 6–20)
CALCIUM: 8.4 mg/dL — AB (ref 8.9–10.3)
CHLORIDE: 100 mmol/L — AB (ref 101–111)
CO2: 26 mmol/L (ref 22–32)
CREATININE: 1.03 mg/dL (ref 0.61–1.24)
GFR calc non Af Amer: >60 mL/min (ref >60)
GLUCOSE: 96 mg/dL (ref 65–99)
Potassium: 4.3 mmol/L (ref 3.5–5.1)
Sodium: 135 mmol/L (ref 135–145)

## 2016-02-10 LAB — CBC
HEMATOCRIT: 29.6 % — AB (ref 39.0–52.0)
Hemoglobin: 9.3 g/dL — ABNORMAL LOW (ref 13.0–17.0)
MCH: 28.5 pg (ref 26.0–34.0)
MCHC: 31.4 g/dL (ref 30.0–36.0)
MCV: 90.8 fL (ref 78.0–100.0)
Platelets: 175 10*3/uL (ref 150–400)
RBC: 3.26 MIL/uL — ABNORMAL LOW (ref 4.22–5.81)
RDW: 16.7 % — ABNORMAL HIGH (ref 11.5–15.5)
WBC: 6.5 10*3/uL (ref 4.0–10.5)

## 2016-02-10 LAB — PROTIME-INR
INR: 2.38 — AB (ref 0.00–1.49)
Prothrombin Time: 25.7 seconds — ABNORMAL HIGH (ref 11.6–15.2)

## 2016-02-10 LAB — CULTURE, BLOOD (ROUTINE X 2)
CULTURE: NO GROWTH
CULTURE: NO GROWTH

## 2016-02-10 LAB — LACTATE DEHYDROGENASE: LDH: 206 U/L — ABNORMAL HIGH (ref 98–192)

## 2016-02-10 NOTE — Discharge Summary (Signed)
Advanced Heart Failure Team  Discharge Summary   Patient ID: [name] MRN: [id number], DOB/AGE: 1940-12-02 75 y.o. Admit date: 02/01/2016 D/C date:     02/10/2016   Primary Discharge Diagnoses:  1. Debilitating back pain due to lumbar compression fracture - Major limiting factor of his functional status  --S/p L3 to L5 Fusion 02/03/16 2. 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 On coumadin 3. PAF/AFL with RVR s/p TEEdccv on 01/19/16 4. CKD stage III 5. 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 6. High-grade R internal carotid stenosis - s/p R CEA 01/13/16 7. Symptomatic anemia/hematoa at operative site - s/p 2u RBCs 4/4  Hospital Course:   [name] is a 75 yo male with h/o VT, PAD and severe systolic HF (EF 0000000) due to mixed ischemic/nonischemic CM he is s/p HM II LVAD implant for destination therapy on October 19, 2013.   Recently had admission for R CEA. Had persistent Aflutter on admission with RVR in 120-150s and required DCCV that admission. R CEA completed without complication. DCCV 01/19/16 with promt return to NSR.   He was admitted for optimization prior to L3 to L5 fusion on 02/03/16 with Dr Erline Levine. Coumadin was held with heparin bridging to surgery. On 3/31 he underwent L3-L5 fusion by Dr Vertell Limber. Post operative course went well. On April 4th hemoglobin dropped to 7.7 due to a small peri-operative hematoma so he received 2UPRBCs with appropriate rise.   Heparin/coumadin restarted 48 hours after surgery. Heparin stopped when INR 1.7 due to hematoma. INR on the day of discharge was 2.38   Neurosurgery followed closely and monitored small hematoma around incision. On the day discharge he was instructed that showers ok, no lotions, creams, ointments to site. Some bloody drainage possible as hematoma resolves, if so, cover with dressing and monitor. Pressure to site if active bleeding ensues. He will follow up with Dr Vertell Limber   In 3-4 weeks.    PT followed post operatively. He made gradual improvements. On the day of discharge he was able to ambulate 410 feet. No PT follow up needed.    From  HF/LVAD perspective he remained stable. He will continue to be followed closely in the LVAD clinic and has follow up next week. Plan to check labs at that time.   LVAD Interrogation HM II:   Speed: 9000    Flow: 6.2      PI: 6.1      Power:  6     Back-up speed: 8400      Discharge Weight: 191 pounds  Discharge Vitals: Blood pressure 83/66, pulse 61, temperature 98.2 F (36.8 C), temperature source Oral, resp. rate 16, height 6' (1.829 m), weight 191 lb 9.3 oz (86.9 kg), SpO2 99 %.  Labs: Lab Results  Component Value Date   WBC 6.5 02/10/2016   HGB 9.3* 02/10/2016   HCT 29.6* 02/10/2016   MCV 90.8 02/10/2016   PLT 175 02/10/2016    Recent Labs Lab 02/10/16 0513  NA 135  K 4.3  CL 100*  CO2 26  BUN 15  CREATININE 1.03  CALCIUM 8.4*  GLUCOSE 96   Lab Results  Component Value Date   CHOL 106 11/05/2015   HDL 33* 11/05/2015   LDLCALC 60 11/05/2015   TRIG 67 11/05/2015   BNP (last 3 results)  Recent Labs  09/21/15 0021 09/22/15 1330 10/07/15 1125  BNP 638.3* 707.6* 491.6*  ProBNP (last 3 results) No results for input(s): PROBNP in the last 8760 hours.   Physical Exam: General:In bed NAD.  In bed.  HEENT: normal Neck: supple. RIJ TLC. JVP 6-7 cm. R CEA scar  Carotids 2+ bilat; no bruits. No thyromegaly or nodule noted. Cor: Mechanical heart sounds with LVAD hum present. Lungs: CTAB, normal effort Abdomen: soft, NT, ND, no HSM. No bruits or masses. +BS Driveline: C/D/I; securement device intact and driveline incorporated Extremities: no cyanosis, clubbing, rash, tr edema Neuro: alert & orientedx3, cranial nerves grossly intact. moves all 4 extremities w/o difficulty. Affect pleasant  Telemetry: Reviewed, A pacing 50-60s   Diagnostic Studies/Procedures   No results found.  Discharge  Medications     Medication List    STOP taking these medications        ferrous gluconate 324 MG tablet  Commonly known as:  FERGON      TAKE these medications        amiodarone 200 MG tablet  Commonly known as:  PACERONE  Take 1 tablet (200 mg total) by mouth daily.     amLODipine 10 MG tablet  Commonly known as:  NORVASC  Take 1 tablet (10 mg total) by mouth at bedtime.     atorvastatin 80 MG tablet  Commonly known as:  LIPITOR  Take 80 mg by mouth daily.     docusate sodium 100 MG capsule  Commonly known as:  COLACE  Take 100 mg by mouth daily.     furosemide 20 MG tablet  Commonly known as:  LASIX  Take 1 tablet (20 mg total) by mouth as needed for edema (weight gain).     guaiFENesin 600 MG 12 hr tablet  Commonly known as:  MUCINEX  Take 1 tablet (600 mg total) by mouth 2 (two) times daily.     ICAPS PO  Take 1 tablet by mouth 2 (two) times daily.     levothyroxine 200 MCG tablet  Commonly known as:  SYNTHROID, LEVOTHROID  Take one 200 mcg daily before breakfast every day with 1 1/2 (300 mcg) every Monday.     metoprolol succinate 25 MG 24 hr tablet  Commonly known as:  TOPROL-XL  Take 1 tablet (25 mg total) by mouth 2 (two) times daily.     nitroGLYCERIN 0.4 MG SL tablet  Commonly known as:  NITROSTAT  Place 0.4 mg under the tongue every 5 (five) minutes as needed for chest pain.     pantoprazole 40 MG tablet  Commonly known as:  PROTONIX  TAKE 1 TABLET EVERY 12 HOURS.     ranolazine 500 MG 12 hr tablet  Commonly known as:  RANEXA  TAKE 1 TABLET (500 MG TOTAL) BY MOUTH 2 (TWO) TIMES DAILY.     warfarin 2 MG tablet  Commonly known as:  COUMADIN  Take 2-4 mg by mouth daily. Take 1 tablet (2mg ) all days except on Monday take 2 tablets (4mg )     zinc gluconate 50 MG tablet  Take 50 mg by mouth 2 (two) times daily.        Disposition   The patient will be discharged in stable condition to home.     Discharge Instructions    Diet - low  sodium heart healthy    Complete by:  As directed      Increase activity slowly    Complete by:  As directed           Follow-up Information  Schedule an appointment as soon as possible for a visit with Peggyann Shoals, MD.   Specialty:  Neurosurgery   Contact information:   [place]. [address] [place] [address] [place] [address] [phone]       Follow up with Glori Bickers, MD On 02/16/2016.   Specialty:  Cardiology   Why:  at 2:00    Contact information:   [place] [address] [phone]         Duration of Discharge Encounter: Greater than 35 minutes   Signed, Darrick Grinder NP-C 02/10/2016, 9:57 AM   Patient seen and examined with Darrick Grinder, NP. We discussed all aspects of the encounter. I agree with the assessment and plan as stated above. He is ok for d/c. Follow closely in [facility].  Emberli Ballester,MD 10:55 PM

## 2016-02-10 NOTE — Progress Notes (Signed)
Occupational Therapy Treatment and Discharge Patient Details Name: [name] MRN: [id number] DOB: January 05, 1941 Today's Date: 02/10/2016    History of present illness 75 yo male admitted 02/01/16 with L4 compression fracture, osteonecrosis, and intractable back pain.  Patient now s/p L3-5 fusion.   PMH:  CHF, LVAD 2014, PAD, ICM, s/p Rt CEA, Flutter with RVR s/p DCCV, CVA, PAF, CAD, MI, HTN, CKD, PVD, HLD   OT comments  Pt able to perform ADL and ADL transfers at a supervision with AE and DME. Girlfriend observing session with no concerns about caring for pt at home. Pt eager for discharge so he may see his family and dog.  Follow Up Recommendations  No OT follow up    Equipment Recommendations  None recommended by OT    Recommendations for Other Services      Precautions / Restrictions Precautions Precautions: Back;Fall Precaution Comments: no cues needed for back precautions       Mobility Bed Mobility   Bed Mobility: Rolling;Sidelying to Sit Rolling: Modified independent (Device/Increase time)         General bed mobility comments: no physical assist, use of rail  Transfers   Equipment used: Rolling walker (2 wheeled) Transfers: Sit to/from Stand Sit to Stand: Supervision         General transfer comment: supervision for safety and lines    Balance                                   ADL Overall ADL's : Needs assistance/impaired Eating/Feeding: Independent;Sitting   Grooming: Wash/dry hands;Wash/dry face;Brushing hair;Sitting;Standing;Supervision/safety;Set up     Upper Body Bathing Details (indicate cue type and reason): instructed pt in use of long sponge for back   Lower Body Bathing Details (indicate cue type and reason): instructed in use of long bath sponge for feet     Lower Body Dressing: Supervision/safety;Sit to/from stand;With adaptive equipment Lower Body Dressing Details (indicate cue type and reason): practiced use of  reacher and sock aide Toilet Transfer: Supervision/safety;Ambulation;RW   Toileting- Clothing Manipulation and Hygiene: Supervision/safety;Sit to/from stand       Functional mobility during ADLs: Supervision/safety;Rolling walker General ADL Comments: Pt without nausea today. Girlfriend observed session.      Vision                     Perception     Praxis      Cognition   Behavior During Therapy: WFL for tasks assessed/performed Overall Cognitive Status: Within Functional Limits for tasks assessed                       Extremity/Trunk Assessment               Exercises     Shoulder Instructions       General Comments      Pertinent Vitals/ Pain       Pain Assessment: No/denies pain  Home Living                                          Prior Functioning/Environment              Frequency Min 2X/week     Progress Toward Goals  OT Goals(current goals can now be found in the care plan  section)  Progress towards OT goals: Goals met/education completed, patient discharged from OT  Acute Rehab OT Goals Patient Stated Goal:  To attend the heart walk in MAY and go to the reunion for the LVAD patients in JUNe . [name] reports he is number 57 out [phone] completed here at [facility] [place] Time For Goal Achievement: 02/19/16  Plan Discharge plan remains appropriate    Co-evaluation                 End of Session Equipment Utilized During Treatment: Rolling walker;Other (comment) (LVAD)   Activity Tolerance Patient tolerated treatment well   Patient Left in chair;with call bell/phone within reach;with nursing/sitter in room;with family/visitor present   Nurse Communication          Time: 7921-7837 OT Time Calculation (min): 32 min  Charges: OT General Charges $OT Visit: 1 Procedure OT Treatments $Self Care/Home Management : 23-37 mins  [name] 02/10/2016, 8:57 AM  [phone]

## 2016-02-10 NOTE — Progress Notes (Signed)
Physical Therapy Treatment Patient Details Name: [name] MRN: [id number] DOB: 1941/06/26 Today's Date: 02/10/2016    History of Present Illness 75 yo male admitted 02/01/16 with L4 compression fracture, osteonecrosis, and intractable back pain.  Patient now s/p L3-5 fusion.   PMH:  CHF, LVAD 2014, PAD, ICM, s/p Rt CEA, Flutter with RVR s/p DCCV, CVA, PAF, CAD, MI, HTN, CKD, PVD, HLD    PT Comments    Patient mobilizing well this session. Reports no pain at this time. Tolerated in hall ambulation as well as transfers without need for physical assist. Patient VSS throughout session. Education reinforced. Anticipate patient will be safe for d/c home.   Follow Up Recommendations  No PT follow up;Supervision - Intermittent     Equipment Recommendations  None recommended by PT    Recommendations for Other Services       Precautions / Restrictions Precautions Precautions: Back;Fall Precaution Comments: no cues needed for back precautions Restrictions Weight Bearing Restrictions: No    Mobility  Bed Mobility   Bed Mobility: Rolling;Sidelying to Sit Rolling: Modified independent (Device/Increase time)         General bed mobility comments: no physical assist, use of rail  Transfers Overall transfer level: Needs assistance Equipment used: Rolling walker (2 wheeled) Transfers: Sit to/from Stand Sit to Stand: Supervision         General transfer comment: no physical assist required  Ambulation/Gait Ambulation/Gait assistance: Supervision Ambulation Distance (Feet): 410 Feet Assistive device: Rolling walker (2 wheeled) Gait Pattern/deviations: Decreased stride length;Step-through pattern;Narrow base of support     General Gait Details: steady with gait this session, ambulated in halls with improved pace, ocassional cues for posture and upright positioning   Stairs            Wheelchair Mobility    Modified Rankin (Stroke Patients Only)        Balance                                    Cognition Arousal/Alertness: Awake/alert Behavior During Therapy: WFL for tasks assessed/performed Overall Cognitive Status: Within Functional Limits for tasks assessed                      Exercises      General Comments General comments (skin integrity, edema, etc.): educated and reviewed mobility expectations and plan for mobility upon discharge. Spoke with patient about positional changes and adherence to precautions. patient receptive and appreciative.      Pertinent Vitals/Pain Pain Assessment: No/denies pain    Home Living                      Prior Function            PT Goals (current goals can now be found in the care plan section) Acute Rehab PT Goals Patient Stated Goal:  To attend the heart walk in MAY and go to the reunion for the LVAD patients in [place]UN[place] . [name] reports he is number 68 out 68 completed here at [facility] PT Goal Formulation: With patient/family Time For Goal Achievement: 02/11/16 Potential to Achieve Goals: Good Progress towards PT goals: Progressing toward goals    Frequency  Min 5X/week    PT Plan Current plan remains appropriate    Co-evaluation             End of Session   Activity  Tolerance: Patient tolerated treatment well Patient left: in bed;with call bell/phone within reach     Time: 0940-1004 PT Time Calculation (min) (ACUTE ONLY): 24 min  Charges:  $Gait Training: 8-22 mins $Self Care/Home Management: 8-22                    G CodesDuncan [name] 18-Feb-2016, 10:26 AM [name], PT DPT  [phone]

## 2016-02-10 NOTE — Progress Notes (Signed)
Subjective: Patient reports "My nausea is gone" "No leg pain"  Objective: Vital signs in last 24 hours: Temp:  [97.7 F (36.5 C)-98.5 F (36.9 C)] 98.5 F (36.9 C) (04/07 0449) Pulse Rate:  [57-60] 58 (04/07 0000) Resp:  [8-14] 13 (04/07 0449) SpO2:  [98 %-100 %] 100 % (04/07 0449) Weight:  [86.9 kg (191 lb 9.3 oz)] 86.9 kg (191 lb 9.3 oz) (04/07 0500)  Intake/Output from previous day: 04/06 0701 - 04/07 0700 In: 720 [P.O.:720] Out: 1175 [Urine:1175] Intake/Output this shift:    Alert, conversant, smiling. Reports some nausea yesterday, now resolved. Lumbar pain, mild, with position changes. Incsions without erythema or drainage. No new bruising. Hematoma similar to improving. Good strength BLE.   Lab Results:  Recent Labs  02/09/16 0410 02/10/16 0513  WBC 5.9 6.5  HGB 9.5* 9.3*  HCT 29.8* 29.6*  PLT 162 175   BMET  Recent Labs  02/08/16 0345 02/10/16 0513  NA 136 135  K 4.9 4.3  CL 102 100*  CO2 26 26  GLUCOSE 96 96  BUN 18 15  CREATININE 1.13 1.03  CALCIUM 8.7* 8.4*    Studies/Results: No results found.  Assessment/Plan: Improving   LOS: 9 days  Lumbar drsg removed, home care discussed (showers ok, no lotions, creams, ointments to site; Some bloody drainage possible as hematoma resolves, if so, cover with dsd and monitor. Pressure to site if active bleeding ensues) Pt verbalizes understanding of d/c instructions.  Home when deemed appropriate by cardiology. Office f/u in 3-4 weeks with Dr. Vertell Limber.   Verdis Prime 02/10/2016, 7:44 AM

## 2016-02-10 NOTE — Progress Notes (Signed)
Central line removed at 1004, pt in bed and laying flat x51mins. PIV removed and discharge instructions reviewed with patient and significant other at bedside. Belongings returned to patient. Pt alert and oriented at time of discharge.

## 2016-02-15 ENCOUNTER — Ambulatory Visit (HOSPITAL_COMMUNITY): Payer: Self-pay | Admitting: *Deleted

## 2016-02-15 LAB — POCT INR: INR: 2.3

## 2016-02-16 ENCOUNTER — Encounter (HOSPITAL_COMMUNITY): Payer: Medicare Other

## 2016-02-22 ENCOUNTER — Encounter (HOSPITAL_COMMUNITY): Payer: Medicare Other

## 2016-02-22 ENCOUNTER — Ambulatory Visit (HOSPITAL_COMMUNITY): Payer: Self-pay | Admitting: *Deleted

## 2016-02-22 LAB — POCT INR: INR: 1.5

## 2016-02-23 ENCOUNTER — Encounter (HOSPITAL_COMMUNITY): Payer: Medicare Other

## 2016-02-24 ENCOUNTER — Ambulatory Visit (HOSPITAL_COMMUNITY): Payer: Self-pay | Admitting: *Deleted

## 2016-02-24 LAB — POCT INR: INR: 2

## 2016-02-29 ENCOUNTER — Ambulatory Visit (HOSPITAL_COMMUNITY)
Admission: RE | Admit: 2016-02-29 | Discharge: 2016-02-29 | Disposition: A | Discharge status: Home / Self Care | Payer: Medicare Other | Source: Ambulatory Visit | Attending: Internal Medicine | Admitting: Internal Medicine

## 2016-02-29 ENCOUNTER — Other Ambulatory Visit (HOSPITAL_COMMUNITY): Payer: Self-pay | Admitting: *Deleted

## 2016-02-29 ENCOUNTER — Ambulatory Visit (HOSPITAL_COMMUNITY): Payer: Self-pay | Admitting: *Deleted

## 2016-02-29 VITALS — BP 74/0 | HR 61 | Ht 72.0 in | Wt 192.6 lb

## 2016-02-29 DIAGNOSIS — Z87442 Personal history of urinary calculi: Secondary | ICD-10-CM | POA: Diagnosis not present

## 2016-02-29 DIAGNOSIS — I255 Ischemic cardiomyopathy: Secondary | ICD-10-CM | POA: Insufficient documentation

## 2016-02-29 DIAGNOSIS — I471 Supraventricular tachycardia: Secondary | ICD-10-CM

## 2016-02-29 DIAGNOSIS — Z7901 Long term (current) use of anticoagulants: Secondary | ICD-10-CM

## 2016-02-29 DIAGNOSIS — I130 Hypertensive heart and chronic kidney disease with heart failure and stage 1 through stage 4 chronic kidney disease, or unspecified chronic kidney disease: Secondary | ICD-10-CM | POA: Insufficient documentation

## 2016-02-29 DIAGNOSIS — E039 Hypothyroidism, unspecified: Secondary | ICD-10-CM | POA: Diagnosis not present

## 2016-02-29 DIAGNOSIS — I252 Old myocardial infarction: Secondary | ICD-10-CM | POA: Insufficient documentation

## 2016-02-29 DIAGNOSIS — E785 Hyperlipidemia, unspecified: Secondary | ICD-10-CM | POA: Diagnosis not present

## 2016-02-29 DIAGNOSIS — N183 Chronic kidney disease, stage 3 (moderate): Secondary | ICD-10-CM | POA: Insufficient documentation

## 2016-02-29 DIAGNOSIS — I739 Peripheral vascular disease, unspecified: Secondary | ICD-10-CM | POA: Diagnosis not present

## 2016-02-29 DIAGNOSIS — Z95828 Presence of other vascular implants and grafts: Secondary | ICD-10-CM | POA: Insufficient documentation

## 2016-02-29 DIAGNOSIS — Z79899 Other long term (current) drug therapy: Secondary | ICD-10-CM | POA: Insufficient documentation

## 2016-02-29 DIAGNOSIS — G4730 Sleep apnea, unspecified: Secondary | ICD-10-CM | POA: Diagnosis not present

## 2016-02-29 DIAGNOSIS — I480 Paroxysmal atrial fibrillation: Secondary | ICD-10-CM

## 2016-02-29 DIAGNOSIS — Z85820 Personal history of malignant melanoma of skin: Secondary | ICD-10-CM | POA: Diagnosis not present

## 2016-02-29 DIAGNOSIS — Z981 Arthrodesis status: Secondary | ICD-10-CM | POA: Insufficient documentation

## 2016-02-29 DIAGNOSIS — Z95811 Presence of heart assist device: Secondary | ICD-10-CM

## 2016-02-29 DIAGNOSIS — I2510 Atherosclerotic heart disease of native coronary artery without angina pectoris: Secondary | ICD-10-CM | POA: Insufficient documentation

## 2016-02-29 DIAGNOSIS — I5022 Chronic systolic (congestive) heart failure: Secondary | ICD-10-CM | POA: Diagnosis not present

## 2016-02-29 DIAGNOSIS — I4891 Unspecified atrial fibrillation: Secondary | ICD-10-CM | POA: Insufficient documentation

## 2016-02-29 DIAGNOSIS — I6521 Occlusion and stenosis of right carotid artery: Secondary | ICD-10-CM

## 2016-02-29 DIAGNOSIS — S32040A Wedge compression fracture of fourth lumbar vertebra, initial encounter for closed fracture: Secondary | ICD-10-CM

## 2016-02-29 DIAGNOSIS — Z8673 Personal history of transient ischemic attack (TIA), and cerebral infarction without residual deficits: Secondary | ICD-10-CM | POA: Diagnosis not present

## 2016-02-29 LAB — BASIC METABOLIC PANEL
ANION GAP: 6 (ref 5–15)
BUN: 28 mg/dL — ABNORMAL HIGH (ref 6–20)
CALCIUM: 9.3 mg/dL (ref 8.9–10.3)
CO2: 25 mmol/L (ref 22–32)
CREATININE: 1.39 mg/dL — AB (ref 0.61–1.24)
Chloride: 107 mmol/L (ref 101–111)
GFR, EST AFRICAN AMERICAN: 55 mL/min — AB (ref >60)
GFR, EST NON AFRICAN AMERICAN: 48 mL/min — AB (ref >60)
Glucose, Bld: 90 mg/dL (ref 65–99)
Potassium: 4.7 mmol/L (ref 3.5–5.1)
SODIUM: 138 mmol/L (ref 135–145)

## 2016-02-29 LAB — CBC
HCT: 35.8 % — ABNORMAL LOW (ref 39.0–52.0)
HEMOGLOBIN: 11.5 g/dL — AB (ref 13.0–17.0)
MCH: 30.2 pg (ref 26.0–34.0)
MCHC: 32.1 g/dL (ref 30.0–36.0)
MCV: 94 fL (ref 78.0–100.0)
PLATELETS: 137 10*3/uL — AB (ref 150–400)
RBC: 3.81 MIL/uL — AB (ref 4.22–5.81)
RDW: 18.3 % — ABNORMAL HIGH (ref 11.5–15.5)
WBC: 6.6 10*3/uL (ref 4.0–10.5)

## 2016-02-29 LAB — PROTIME-INR
INR: 2.29 — AB (ref 0.00–1.49)
PROTHROMBIN TIME: 25 s — AB (ref 11.6–15.2)

## 2016-02-29 LAB — LACTATE DEHYDROGENASE: LDH: 258 U/L — AB (ref 98–192)

## 2016-02-29 NOTE — Patient Instructions (Signed)
1.  No change in coumadin dose.  Re-check INR weekly as usual. 2.  Return to [facility] clinic in one month.

## 2016-02-29 NOTE — Progress Notes (Addendum)
Presents today with [name] (significant other).   Symptom  Yes  No  Details   Angina       x Activity:   Claudication        x How far:   Syncope        x When:     Stroke        x        TIA  2001  Orthopnea         x How many pillows: 1  PND         x How often:  CPAP       N/A How many hrs:   Pedal edema        x        R>L baseline   Abd fullness               x    N&V               x   Diaphoresis               x   Bleeding              x   Urine color        yellow  SOB        x      Activity: incline, no more than normal  Palpitations        x When:   ICD shock        x   Hospitlizaitons         x      When/where/why: 3/29 - 02/10/16 L3 -L5 spinal fusion  ED visit        x When/where/why:  Other MD              x When/who/why:   Activity        Increasing    Fluid    No limitations   Diet    Low salt food   Vital signs: HR: 61 Doppler MAP: 74 Auto cuff: 90/63 (75) O2 Sat:  97% Wt:  192.6 lbs Last wt: 195.6 lbs  Ht: 6'0"  LVAD interrogation reveals (Reviewed with Dr Aundra Dubin):  Speed:   9000 Flow:  3.7 Power:  4.6 PI:  3.7 Alarms: 3 brief low flows x 3 02/20/16 Events:  10 - 15 PI events Fixed speed:  9000 Low speed limit:  8400 Primary Controller:  Replace back up battery in 29 months   Back up controller:   Replace back up battery in 29 months   LVAD interrogated and functioning as expected. Interrogation Positive for flows > 10.0 watts on 4/20, 4/23, 4/24 with powers 8.0. NEGATIVE for significant PI Events or speed drops.   Driveline Site: Drive line site fully healed and incorporated. Velour is fully implanted at the site. Provided dressing supplies for 1 month.   Encounter Details: Reports to clinic for hospital d/c follow up. Doing 1 Boost every day with improved appetite; eating more. Reports only one episode of "gagging" since hospital discharge and this occurred after taking am meds on empty stomach.  Weights are stable around 180 - 182 lbs.    CV  performed on 01/13/16 for afib; Optivol performed today with no further AFib noted since that time.    INR  2.29 --> Goal 2 - 2.5, no ASA, Plavix stopped last hospitalization. See anticoag note.   LDH 258--> baseline 220 - 300 (stopped  Plavix last hospitalization), has had some flow/power elevations.   Creat 1.39 ---> up from 1.03 at hospital discharge; pt has taken prn Lasix 20 mg three times since discharge due to right leg swelling.   Lumbar incisions have had small amount serous drainage; during clinic visit today, noted to have increased amount of bloody drainage per wife.  Dressing applied; pt has f/u appointment with Dr. Vertell Limber this afternoon.   Patient Instructions: 1.  No change in coumadin dose.  Re-check INR weekly as usual. 2.  Return to [place] clinic in one month.   Kirk Ruths, RN [place] Coordinator  Office: [phone] 24/7 VAD Pager: [phone]

## 2016-03-04 NOTE — Progress Notes (Signed)
Patient ID: [name], male   DOB: 02/02/1941, 75 y.o.   MRN: [id number] Patient ID: [name], male   DOB: 23-Mar-1941, 75 y.o.   MRN: [id number]    V[facility] CLINIC PROGRESS NOTE  PCP: Dr. Kandice Robinsons ([place])  Tracker Mullings is a 75 yo male with h/o VT, PAD and severe systolic HF (EF 0000000) due to mixed ischemic/nonischemic CM he is s/p HM II LVAD implant for destination therapy on October 19, 2013. On 09/01/14 underwent cystoscopy and flexible ureteroscopy with lithotripsy and basket extraction.   VAD implantation was complicated by VT, syncope, A fib/Aflutter and persistent low output felt to be due to RHF. RHC showed low cardiac output but no evidence of RV failure on Milrinone. He was felt to be volume depleted. He was discharged home on 11/18/13 on mirlinone and PRN lasix 20 mg for a weight of 200 pounds or greater. Milrinone has since been weaned off.   RHC (07/13/14): Well compensated hemodynamics with VAD support.  Admitted 11/9-11/19/15 for hematuria and chronic nephrolithiasis. Underwent Echo to assess RV for concern RV failure because he presented with SOB. RV looked good and had renal US showed mild hyronephrosis and bilateral renal cysts. Dr. Risa Grill took back for repeat lithotripsy and stone extraction by ureteroscopy and J stent placement. Received total of 3 PRBCs. D/C weight 188 lbs.   In 4/16 admitted with weakness and fall. In ER hemoglobin was found to be 5.4 with MAP 78.  While hospitalized he received 5U PRBCs. 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 Then readmitted with acute L4 compression fracture. Most recently admitted with weakness and low PI. Found to have recurrent atrial tach and underwent TEE/DCCV on 03/02/15.  He was admitted in 12/16 with CVA causing left-sided weakness.  CT did not show CVA and unable to undergo MRI with device.  There was initially some concern for significant carotid stenosis, but this was reviewed  with Dr Kellie Simmering who did not think that the carotid disease was significant. He was started on Plavix.   On 01/13/16 underwent R CEA without complication. Had DC-CV prior for AF prior to D/C.  On 02/03/16 underwent L3-L5 fusion and bone grafting for compression fracture.   Follow up LVAD Clinic: Presents for post-hospital f/u. Back feels much better after surgery but has noticed some swelling at site. Able to get around more. Denies SOB. No palpitations. No bleeding, dark stools, neuro symptoms or dark urine. Has take lasix 3 times ths week for swelling in RLE.     LVAD interrogation reveals (Reviewed with Dr Aundra Dubin):  Speed: 9000 Flow: 3.7 Power: 4.6 PI: 3.7 Alarms: 3 brief low flows x 3 02/20/16 Events: 10 - 15 PI events Fixed speed: 9000 Low speed limit: 8400 Primary Controller: Replace back up battery in 29 months  Back up controller: Replace back up battery in 29 months   LVAD interrogated and functioning as expected. Interrogation Positive for flows > 10.0 watts on 4/20, 4/23, 4/24 with powers 8.0. NEGATIVE for significant PI Events or speed drops.    Past Medical History  Diagnosis Date  . Bradycardia   . Ventricular tachycardia ([place])     a. s/p AICD;   h/o ICD shock;  Amiodarone Rx. S/P ablation in 2012  . PVD (peripheral vascular disease) ([place])     a. 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 HTN (hypertension)   . HLD (hyperlipidemia)   . Cytopenia   . Hypothyroidism   . CKD (chronic kidney disease)   . Chronic systolic heart failure ([place])     a. s/p LVAD 10/2013 for destination therapy  . Ischemic cardiomyopathy   . CVD (cerebrovascular disease)     a. s/p L CEA 2008  . Stroke [facility])     a. 10/2015 admission   . Inappropriate therapy from implantable cardioverter-defibrillator     a. ATP for sinus tach SVT wavelet identified SVT but was no  passive (2014)  .  Automatic implantable cardioverter-defibrillator in situ     a. s/p Medtronic Evera device serial [id number]    . 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 L Ear   . PAF (paroxysmal atrial fibrillation) ([place])   . CAD (coronary artery disease)     a. s/p MI 1982;  s/p DES to CFX 2011;  s/p NSTEMI 4/12 in setting of VTach;  cath 7/12:  LM ok, LAD mid 30-40%, Dx's with 40%, mCFX stent patent, prox to mid RCA occluded with R to R and L to R collats.  Medical Tx was continued  . History of GI bleed     a. on ASA- started on plavix during 10/2015 admission for CVA  . Myocardial infarction ([place])     1992  . Dysrhythmia     AFIB  . Sleep apnea     NO CPAP NOW  HE SENT IT BACK YRS AGO  . Shortness of breath dyspnea     W/ EXERTION   . Arthritis     Current Outpatient Prescriptions  Medication Sig Dispense Refill  . amiodarone (PACERONE) 200 MG tablet Take 1 tablet (200 mg total) by mouth daily. 32 tablet 6  . amLODipine (NORVASC) 10 MG tablet Take 1 tablet (10 mg total) by mouth at bedtime. 30 tablet 6  . atorvastatin (LIPITOR) 80 MG tablet Take 80 mg by mouth daily.     [name] docusate sodium (COLACE) 100 MG capsule Take 100 mg by mouth daily.     . furosemide (LASIX) 20 MG tablet Take 1 tablet (20 mg total) by mouth as needed for edema (weight gain). 30 tablet 3  . levothyroxine (SYNTHROID, LEVOTHROID) 200 MCG tablet Take one 200 mcg daily before breakfast every day with 1 1/2 (300 mcg) every Monday. (Patient taking differently: Take 200-300 mcg by mouth daily before breakfast. Take 1 tablet (224mcg) all days except on Mondays take 1&1/2 tablets (359mcg)) 45 tablet 6  . metoprolol succinate (TOPROL-XL) 25 MG 24 hr tablet Take 1 tablet (25 mg total) by mouth 2 (two) times daily. 60 tablet 3  . Multiple Vitamins-Minerals (ICAPS PO) Take 1 tablet by mouth 2 (two) times daily.    . pantoprazole (PROTONIX) 40 MG tablet TAKE 1 TABLET EVERY 12 HOURS. 60 tablet 3  . ranolazine (RANEXA) 500 MG 12 hr tablet TAKE 1  TABLET (500 MG TOTAL) BY MOUTH 2 (TWO) TIMES DAILY. 60 tablet 6  . warfarin (COUMADIN) 2 MG tablet Take 2-4 mg by mouth daily. Take 1 tablet (2mg ) all days except on Monday take 2 tablets (4mg )    . zinc gluconate 50 MG tablet Take 50 mg by mouth 2 (two) times daily.      No current facility-administered medications for this encounter.    Demerol  REVIEW OF SYSTEMS: All systems negative except as listed  in HPI, PMH and Problem list.   Vital signs: HR: 61 Doppler MAP: 74 Auto cuff: 90/63 (75) O2 Sat: 97% Wt: 192.6 lbs Last wt: 195.6 lbs  Ht: 6'0"  Physical Exam: GENERAL: NAD HEENT: normal NECK: Supple, JVP 7, no carotid bruits. R CEA scar well-healed No lymphadenopathy or thyromegaly appreciated.   CARDIAC:  Mechanical heart sounds with LVAD hum present.  LUNGS:  clear.  ABDOMEN:  Soft, round, nontender, positive bowel sounds x4.    Back: scar well healed. Small surrounding hematoma. No signs infectio LVAD exit site: well-healed and incorporated.  Dressing dry and intact.  No erythema or drainage.  Stabilization device present and accurately applied.  Driveline dressing is being changed weekly per sterile technique. EXTREMITIES:  Warm and dry, no cyanosis, clubbing, trace-1+ edema R>L NEUROLOGIC:  Alert and oriented x 4.  Gait steady.  No aphasia.  No dysarthria.  Affect pleasant. Grossly normal bilateral upper and lower extremity strength.   ASSESSMENT AND PLAN:  1. Chronic systolic HF: s/p LVAD [id number].  Remains NYHA II-III.   - Volume status looks good. Creatinine mildly elevated. Take lasix only PRN.  2.  Atrial arrhythmias: Atrial fibrillation, atrial tachycardia.  S/p DC-CV 06/04/14, DC-CV 03/02/15 and 01/19/16.   - Maintaining NSR on low dose amio. At next visit decrease to 100 daily given h/o thyroid toxicity. Follow TFTs closely.  3. HTN: Improved 4. LVAD: LVAD parameters reviewed personally, stable. He had a few brief power spikes but now resolved. No other evidence of  pump thrombosis 5. CKD, stage III:  Up slightly. Reminded to take lasix only PRN 6. Anticoagulation management: Goal INR 2-2.5. He is off ASA with GI bleeding.  However, Plavix was started recently with CVA. Check labs.  7. Nephrolithiasis s/p Renal Stone lithotripsy: Per Dr Risa Grill. Stable. 8. 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 Now on Plavix in addition to warfarin.   9. Lumbar compression fracture: s/p L3-L5 fusion 02/03/16. Pain much improved -small hematoma at operative site. Has f/u with Dr. Vertell Limber today 10. 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  Continue atorvastatin. Plavix added to regimen (also warfarin INR goal 2-2.5). S/p R CEA 01/13/16.  Glori Bickers MD 03/04/2016

## 2016-03-06 ENCOUNTER — Telehealth (HOSPITAL_COMMUNITY): Payer: Self-pay | Admitting: Pharmacist

## 2016-03-06 NOTE — Telephone Encounter (Signed)
Spoke with [name] and recommended using ocular multivitamin not containing vitamin E which interferes with Vitamin K-dependent clotting factors increasing the risk for bleeding with warfarin. In my research, I found VisiVite AREDS 2 formula which does not contain vitamin E.   [name] K. [name], PharmD, BCPS, CPP Clinical Pharmacist Pager: [phone] Phone: [phone] 03/06/2016 9:30 AM

## 2016-03-06 NOTE — Telephone Encounter (Signed)
-----   Message from Lezlie Octave, RN sent at 03/05/2016 11:53 AM EDT ----- Message from wife wanting to know what eye vitamins he could take (other than fish oil) that would be safe to use with warfarin. Thanks, [employer]

## 2016-03-07 ENCOUNTER — Ambulatory Visit (HOSPITAL_COMMUNITY): Payer: Self-pay | Admitting: *Deleted

## 2016-03-07 LAB — POCT INR: INR: 1.6

## 2016-03-09 ENCOUNTER — Ambulatory Visit (HOSPITAL_COMMUNITY): Payer: Self-pay | Admitting: *Deleted

## 2016-03-09 LAB — POCT INR: INR: 1.9

## 2016-03-12 ENCOUNTER — Ambulatory Visit (INDEPENDENT_AMBULATORY_CARE_PROVIDER_SITE_OTHER): Payer: Medicare Other | Admitting: *Deleted

## 2016-03-12 DIAGNOSIS — I255 Ischemic cardiomyopathy: Secondary | ICD-10-CM | POA: Diagnosis not present

## 2016-03-13 NOTE — Progress Notes (Signed)
Remote ICD transmission.   

## 2016-03-14 ENCOUNTER — Ambulatory Visit (HOSPITAL_COMMUNITY): Payer: Self-pay | Admitting: *Deleted

## 2016-03-14 LAB — POCT INR: INR: 2.7

## 2016-03-20 ENCOUNTER — Other Ambulatory Visit (HOSPITAL_COMMUNITY): Payer: Self-pay | Admitting: *Deleted

## 2016-03-20 ENCOUNTER — Ambulatory Visit (HOSPITAL_COMMUNITY): Payer: Self-pay | Admitting: *Deleted

## 2016-03-20 ENCOUNTER — Ambulatory Visit (HOSPITAL_COMMUNITY)
Admission: RE | Admit: 2016-03-20 | Discharge: 2016-03-20 | Disposition: A | Discharge status: Home / Self Care | Payer: Medicare Other | Source: Ambulatory Visit | Attending: Cardiology | Admitting: Cardiology

## 2016-03-20 VITALS — BP 84/0 | HR 67 | Ht 72.0 in | Wt 192.4 lb

## 2016-03-20 DIAGNOSIS — I130 Hypertensive heart and chronic kidney disease with heart failure and stage 1 through stage 4 chronic kidney disease, or unspecified chronic kidney disease: Secondary | ICD-10-CM | POA: Insufficient documentation

## 2016-03-20 DIAGNOSIS — N183 Chronic kidney disease, stage 3 unspecified: Secondary | ICD-10-CM

## 2016-03-20 DIAGNOSIS — Z79899 Other long term (current) drug therapy: Secondary | ICD-10-CM | POA: Diagnosis not present

## 2016-03-20 DIAGNOSIS — I5022 Chronic systolic (congestive) heart failure: Secondary | ICD-10-CM | POA: Diagnosis not present

## 2016-03-20 DIAGNOSIS — Z981 Arthrodesis status: Secondary | ICD-10-CM | POA: Insufficient documentation

## 2016-03-20 DIAGNOSIS — E785 Hyperlipidemia, unspecified: Secondary | ICD-10-CM | POA: Insufficient documentation

## 2016-03-20 DIAGNOSIS — I252 Old myocardial infarction: Secondary | ICD-10-CM | POA: Insufficient documentation

## 2016-03-20 DIAGNOSIS — I255 Ischemic cardiomyopathy: Secondary | ICD-10-CM | POA: Diagnosis not present

## 2016-03-20 DIAGNOSIS — I6389 Other cerebral infarction: Secondary | ICD-10-CM

## 2016-03-20 DIAGNOSIS — S31000A Unspecified open wound of lower back and pelvis without penetration into retroperitoneum, initial encounter: Secondary | ICD-10-CM | POA: Insufficient documentation

## 2016-03-20 DIAGNOSIS — I2510 Atherosclerotic heart disease of native coronary artery without angina pectoris: Secondary | ICD-10-CM | POA: Insufficient documentation

## 2016-03-20 DIAGNOSIS — I159 Secondary hypertension, unspecified: Secondary | ICD-10-CM

## 2016-03-20 DIAGNOSIS — Z85820 Personal history of malignant melanoma of skin: Secondary | ICD-10-CM | POA: Insufficient documentation

## 2016-03-20 DIAGNOSIS — M4856XD Collapsed vertebra, not elsewhere classified, lumbar region, subsequent encounter for fracture with routine healing: Secondary | ICD-10-CM | POA: Insufficient documentation

## 2016-03-20 DIAGNOSIS — Z95811 Presence of heart assist device: Secondary | ICD-10-CM | POA: Insufficient documentation

## 2016-03-20 DIAGNOSIS — Z95828 Presence of other vascular implants and grafts: Secondary | ICD-10-CM | POA: Insufficient documentation

## 2016-03-20 DIAGNOSIS — T148XXD Other injury of unspecified body region, subsequent encounter: Secondary | ICD-10-CM

## 2016-03-20 DIAGNOSIS — I4891 Unspecified atrial fibrillation: Secondary | ICD-10-CM | POA: Insufficient documentation

## 2016-03-20 DIAGNOSIS — Z7901 Long term (current) use of anticoagulants: Secondary | ICD-10-CM | POA: Insufficient documentation

## 2016-03-20 DIAGNOSIS — I739 Peripheral vascular disease, unspecified: Secondary | ICD-10-CM | POA: Diagnosis not present

## 2016-03-20 DIAGNOSIS — I638 Other cerebral infarction: Secondary | ICD-10-CM

## 2016-03-20 DIAGNOSIS — Z8673 Personal history of transient ischemic attack (TIA), and cerebral infarction without residual deficits: Secondary | ICD-10-CM | POA: Diagnosis not present

## 2016-03-20 DIAGNOSIS — Z955 Presence of coronary angioplasty implant and graft: Secondary | ICD-10-CM | POA: Insufficient documentation

## 2016-03-20 DIAGNOSIS — E039 Hypothyroidism, unspecified: Secondary | ICD-10-CM | POA: Diagnosis not present

## 2016-03-20 DIAGNOSIS — Z87442 Personal history of urinary calculi: Secondary | ICD-10-CM | POA: Diagnosis not present

## 2016-03-20 DIAGNOSIS — I480 Paroxysmal atrial fibrillation: Secondary | ICD-10-CM

## 2016-03-20 LAB — BASIC METABOLIC PANEL
ANION GAP: 11 (ref 5–15)
BUN: 33 mg/dL — ABNORMAL HIGH (ref 6–20)
CO2: 25 mmol/L (ref 22–32)
Calcium: 9.6 mg/dL (ref 8.9–10.3)
Chloride: 102 mmol/L (ref 101–111)
Creatinine, Ser: 1.65 mg/dL — ABNORMAL HIGH (ref 0.61–1.24)
GFR, EST AFRICAN AMERICAN: 45 mL/min — AB (ref >60)
GFR, EST NON AFRICAN AMERICAN: 39 mL/min — AB (ref >60)
Glucose, Bld: 79 mg/dL (ref 65–99)
POTASSIUM: 4.5 mmol/L (ref 3.5–5.1)
SODIUM: 138 mmol/L (ref 135–145)

## 2016-03-20 LAB — CBC
HEMATOCRIT: 39.5 % (ref 39.0–52.0)
HEMOGLOBIN: 12.7 g/dL — AB (ref 13.0–17.0)
MCH: 30.8 pg (ref 26.0–34.0)
MCHC: 32.2 g/dL (ref 30.0–36.0)
MCV: 95.6 fL (ref 78.0–100.0)
Platelets: 169 10*3/uL (ref 150–400)
RBC: 4.13 MIL/uL — AB (ref 4.22–5.81)
RDW: 18.2 % — AB (ref 11.5–15.5)
WBC: 9.5 10*3/uL (ref 4.0–10.5)

## 2016-03-20 LAB — PROTIME-INR
INR: 1.57 — AB (ref 0.00–1.49)
Prothrombin Time: 18.8 seconds — ABNORMAL HIGH (ref 11.6–15.2)

## 2016-03-20 LAB — PREALBUMIN: PREALBUMIN: 25 mg/dL (ref 18–38)

## 2016-03-20 LAB — LACTATE DEHYDROGENASE: LDH: 306 U/L — AB (ref 98–192)

## 2016-03-20 MED ORDER — CLOPIDOGREL BISULFATE 75 MG PO TABS: 75.0000 mg | ORAL_TABLET | Freq: Every day | ORAL | Status: DC

## 2016-03-20 NOTE — Patient Instructions (Addendum)
1.  Change back dressing every two days; pack with aquacel silver strips. 2.  Take coumadin 4 mg today then increase weekly dose to 2 mg daily except 4 mg on Mon/Thurs. 3.  Re-start Plavix 75 mg daily 4.  Increase protein shakes to two daily for wound healing. 5.  Wound check next week 6.  Return to [facility] in one month.

## 2016-03-20 NOTE — Progress Notes (Addendum)
Presents today with [name] (significant other).   Symptom  Yes  No  Details   Angina       x Activity:   Claudication        x How far:   Syncope        x When:     Stroke        x        TIA  2001  Orthopnea         x How many pillows: 1  PND         x How often:  CPAP       N/A How many hrs:   Pedal edema        x        R>L baseline   Abd fullness               x    N&V               x   Diaphoresis               x   Bleeding              x   Urine color        yellow  SOB        x      Activity: incline, no more than normal  Palpitations        x When:   ICD shock        x   Hospitlizaitons               x When/where/why:   ED visit        x When/where/why:  Other MD        x       When/who/why: Dr. Vertell Limber 02/29/16; Keflex bid x 10 days   Activity        Good days - bad days with back pain    Fluid    No limitations   Diet    Low salt food   Vital signs: HR: 67 Doppler MAP: 84 Auto cuff: ---- O2 Sat:  97% Wt:   lbs Last wt: 192.6 lbs  Ht: 6'0"  LVAD interrogation reveals (Reviewed with Dr Aundra Dubin):  Speed:   9000 Flow:  3.9 Power:  4.6 PI:  5.2 Alarms: none Events:  @ 20 PI events daily Fixed speed:  9000 Low speed limit:  8400 Primary Controller:  Replace back up battery in 28 months   Back up controller:   Replace back up battery in 28 months   LVAD interrogated and functioning as expected. Interrogation Negative for elevated flows or powers; no significant change in number of PI events or speed drops.   Driveline Site: Drive line site fully healed and incorporated. Velour is fully implanted at the site. Wife is performing weekly dressing changes using Sorbaview dressings with Biopatch on exit site. Provided dressing supplies for 1 month.   Encounter Details: Reports to clinic for sick visit based on non healing incision right of spinal incision. Pt saw Dr. Vertell Limber (Ortho) on 02/29/16 with small open incision right side of  Midline spinal incision. [name] reports  pt was started on Keflex for 10 days (completed). States opening has been geting bigger since that visit. Darrick Grinder, NP assessed wound today. [name] will continue every other day dressing changes using aquacel silver strips and gauze dressing. Pt has f/u appt with Dr. Vertell Limber next  week.      Reports only one episode of "gagging" this am.    Optivol performed today with sporadic non-sustained episodes of atrial fib/tach.  Pt denies palpitations, reports occasional orthostatic dizziness, denies syncopal events. Currently on Amiodarone 200 mg daily.     INR  1.57 --> Goal 2 - 2.5, no ASA, no Plavix, but will restart per Darrick Grinder, NP. See anticoag note.   LDH 306 --> baseline 220 - 330 (stopped Plavix March hospitalization), no flow/power elevations noted this visit.   Creat 1.65  ---> up from 1.03 at hospital discharge; pt has not taken prn Lasix 20 since last visit.    Patient Instructions: 1.  Change back dressing every two days; pack with aquacel silver strips. 2.  Take coumadin 4 mg today then increase weekly dose to 2 mg daily except 4 mg on Mon/Thurs. 3.  Re-start Plavix 75 mg daily 4.  Increase protein shakes to two daily for wound healing. 5.  Wound check next week 6.  Return to [facility] in one month.  Kirk Ruths, RN [employer] Coordinator  Office: [phone] 24/7 VAD Pager: [phone]

## 2016-03-20 NOTE — Progress Notes (Signed)
Patient ID: [name], male   DOB: May 30, 1941, 75 y.o.   MRN: [id number]   VAD CLINIC PROGRESS NOTE  PCP: Dr. Kandice Robinsons ([place]) GU: Dr Risa Grill  Neuro Surgeon: Dr Vertell Limber Vascular: Dr Kellie Simmering Primary HF: Dr Haroldine Laws  [name] is a 75 yo male with h/o VT, PAD and severe systolic HF (EF 0000000) due to mixed ischemic/nonischemic CM he is s/p HM II LVAD implant for destination therapy on October 19, 2013. On 09/01/14 underwent cystoscopy and flexible ureteroscopy with lithotripsy and basket extraction.   VAD implantation was complicated by VT, syncope, A fib/Aflutter and persistent low output felt to be due to RHF. Required short term milrinone.   GU Event 09/2015 Hematuria and chronic nephrolithiasis. Renal US showed mild hyronephrosis and bilateral renal cysts. Dr. Risa Grill took back for repeat lithotripsy and stone extraction by ureteroscopy and J stent placement.  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  01/2016 R CEA. He was started on Plavix.  01/2016 Lumbar fusion L3-L5 with pedicle screws posterolateral arthrodesis with BMP, allograft  Follow up LVAD Clinic:  Presents for LVAD follow and back wound assessment. Increased exudate noted. Saw Dr Vertell Limber 02/29/16 and started keflex twice a day x 10 days.  His wife has been changing dressing every couple of days. He has not had chills or fever.  Denies SOB/PND/Orthopnea. Taking all medications. No  palpitations. No bleeding, dark stools, neuro symptoms or dark urine. Has not had lasix.   LVAD interrogation reveals (Reviewed with Dr Aundra Dubin):  Speed: 9000 Flow: 3.9 Power: 4.6 PI: 5.2  Alarms: none Events:~20 PI events Fixed speed: 9000 Low speed limit: 8400 Primary Controller: Replace back up battery in 29 months  Back up controller: Replace back up battery in 29 months   LVAD interrogated and functioning as  expected. NEGATIVE for significant PI Events or speed drops.    Past Medical History  Diagnosis Date  . Bradycardia   . Ventricular tachycardia ([place])     a. s/p AICD;   h/o ICD shock;  Amiodarone Rx. S/P ablation in 2012  . PVD (peripheral vascular disease) ([place])     a. 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 HTN (hypertension)   . HLD (hyperlipidemia)   . Cytopenia   . Hypothyroidism   . CKD (chronic kidney disease)   . Chronic systolic heart failure ([place])     a. s/p LVAD 10/2013 for destination therapy  . Ischemic cardiomyopathy   . CVD (cerebrovascular disease)     a. s/p L CEA 2008  . Stroke [facility])     a. 10/2015 admission   . Inappropriate therapy from implantable cardioverter-defibrillator     a. ATP for sinus tach SVT wavelet identified SVT but was no  passive (2014)  . Automatic implantable cardioverter-defibrillator in situ     a. s/p Medtronic Evera device serial [id number]    . Melanoma of ear ([place])     a. L Ear   . PAF (paroxysmal atrial fibrillation) ([place])   . CAD (coronary artery disease)     a. s/p MI 1982;  s/p DES to CFX 2011;  s/p NSTEMI 4/12 in setting of VTach;  cath 7/12:  LM ok, LAD mid 30-40%,  Dx's with 40%, mCFX stent patent, prox to mid RCA occluded with R to R and L to R collats.  Medical Tx was continued  . History of GI bleed     a. on ASA- started on plavix during 10/2015 admission for CVA  . Myocardial infarction ([place])     1992  . Dysrhythmia     AFIB  . Sleep apnea     NO CPAP NOW  HE SENT IT BACK YRS AGO  . Shortness of breath dyspnea     W/ EXERTION   . Arthritis     Current Outpatient Prescriptions  Medication Sig Dispense Refill  . amiodarone (PACERONE) 200 MG tablet Take 1 tablet (200 mg total) by mouth daily. 32 tablet 6  . amLODipine (NORVASC) 10 MG tablet Take 1 tablet (10 mg total) by mouth at bedtime. 30 tablet 6  . atorvastatin  (LIPITOR) 80 MG tablet Take 80 mg by mouth daily.     [name] docusate sodium (COLACE) 100 MG capsule Take 100 mg by mouth daily.     [name] levothyroxine (SYNTHROID, LEVOTHROID) 200 MCG tablet Take one 200 mcg daily before breakfast every day with 1 1/2 (300 mcg) every Monday. (Patient taking differently: Take 200-300 mcg by mouth daily before breakfast. Take 1 tablet (260mcg) all days except on Mondays take 1&1/2 tablets (341mcg)) 45 tablet 6  . metoprolol succinate (TOPROL-XL) 25 MG 24 hr tablet Take 1 tablet (25 mg total) by mouth 2 (two) times daily. 60 tablet 3  . Multiple Vitamins-Minerals (ICAPS PO) Take 1 tablet by mouth 2 (two) times daily.    . pantoprazole (PROTONIX) 40 MG tablet TAKE 1 TABLET EVERY 12 HOURS. 60 tablet 3  . ranolazine (RANEXA) 500 MG 12 hr tablet TAKE 1 TABLET (500 MG TOTAL) BY MOUTH 2 (TWO) TIMES DAILY. 60 tablet 6  . warfarin (COUMADIN) 2 MG tablet Take 2-4 mg by mouth daily. Take 1 tablet (2mg ) all days except on Monday take 2 tablets (4mg )    . zinc gluconate 50 MG tablet Take 50 mg by mouth 2 (two) times daily.     . furosemide (LASIX) 20 MG tablet Take 1 tablet (20 mg total) by mouth as needed for edema (weight gain). (Patient not taking: Reported on 03/20/2016) 30 tablet 3   No current facility-administered medications for this encounter.    Demerol  REVIEW OF SYSTEMS: All systems negative except as listed in HPI, PMH and Problem list.   Vital signs: HR: 67 Doppler MAP: 84 Auto cuff: --- O2 Sat: 97% Wt: 192.6 lbs Last wt: 195.6 lbs  Ht: 6'0"  Physical Exam: GENERAL: NAD HEENT: normal NECK: Supple, JVP 6-7  no carotid bruits. R CEA scar well-healed No lymphadenopathy or thyromegaly appreciated.   CARDIAC:  Mechanical heart sounds with LVAD hum present.  LUNGS:  clear.  ABDOMEN:  Soft, round, nontender, positive bowel sounds x4.   LVAD exit site: well-healed and incorporated.  Dressing dry and intact.  No erythema or drainage.  Stabilization device present  and accurately applied.  Driveline dressing is being changed weekly per sterile technique. EXTREMITIES:  Warm and dry, no cyanosis, clubbing, trace edema  NEUROLOGIC:  Alert and oriented x 4.  Gait steady.  No aphasia.  No dysarthria.  Affect pleasant. Grossly normal bilateral upper and lower extremity strength.  Wound: lumbar wound full thickness 3x2x1 cm with 90% slough 10% pale pink. Undermining noted. Periwound intact. No odor.   ASSESSMENT AND PLAN:  1. Chronic systolic HF:  s/p LVAD 10/2014.  Remains NYHA II-III.   - Volume status stable. looks good. Creatinine mildly elevated. Take lasix only PRN.  2.  Atrial arrhythmias: Atrial fibrillation, atrial tachycardia.  S/p DC-CV 06/04/14, DC-CV 03/02/15 and 01/19/16.  - Regular rhythm. Will need to continue  amio 200 mg daily.  3. HTN: MAP stable.  4. LVAD: LVAD parameters reviewed personally, stable.  5. CKD, stage III:  Up slightly. Reminded to take lasix only PRN 6. Anticoagulation management: Goal INR 2-2.5. He is off ASA with GI bleeding. Restart  plavix. INR 1.57. See anticoag noted.   7. Nephrolithiasis s/p Renal Stone lithotripsy: Per Dr Risa Grill. Stable. 8. 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 Continue coumadin and restart plavix will check CBC today and next week. No bleeding problems.    9. Lumbar compression fracture: s/p L3-L5 fusion 02/03/16. He has full thickness wound from hematoma. I performed sharp debridement using scissor and forcep with slough removed. No bleeding. He tolerated without difficulty. Does appear infected. Still with scattered slough and undermining. Start Aquacel Ag every other day. Prealbumin ok 25. May need to use ACell to stimulate growth factors. Follow up next Wed to reassess.  10. 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  Continue atorvastatin. (also warfarin INR goal 2-2.5). S/p R CEA 01/13/16. Restart plavix. Stopped prior to surgery.   Follow up next week to reassess wound. See VAD note  below.   Amy Clegg NP-C 03/20/2016   Symptom  Yes  No  Details   Angina   x Activity:   Claudication    x How far:   Syncope    x When:   Stroke   x   TIA 2001  Orthopnea    x How many pillows: 1  PND    x How often:  CPAP    N/A How many hrs:   Pedal edema   x   R>L baseline   Abd fullness     x    N&V     x   Diaphoresis     x   Bleeding    x   Urine color    yellow  SOB   x   Activity: incline, no more than normal  Palpitations    x When:   ICD shock    x   Hospitlizaitons     x When/where/why:   ED visit    x When/where/why:  Other MD   x   When/who/why: Dr. Vertell Limber 02/29/16; Keflex bid x 10 days   Activity     Good days - bad days with back pain    Fluid    No limitations   Diet    Low salt food   Vital signs: HR: 67 Doppler MAP: 84 Auto cuff: ---- O2 Sat: 97% Wt: lbs Last wt: 192.6 lbs  Ht: 6'0"  LVAD interrogation reveals (Reviewed with Dr Aundra Dubin):  Speed: 9000 Flow: 3.9 Power: 4.6 PI: 5.2 Alarms: none Events: @ 20 PI events daily Fixed speed: 9000 Low speed limit: 8400 Primary Controller: Replace back up battery in 28 months  Back up controller: Replace back up battery in 28 months   LVAD interrogated and functioning as expected. Interrogation Negative for elevated flows or powers; no significant change in number of PI events or speed drops.   Driveline Site: Drive line site fully healed and incorporated. Velour is fully implanted at the site. Wife is performing weekly dressing changes using Sorbaview dressings  with Biopatch on exit site. Provided dressing supplies for 1 month.   Encounter Details: Reports to clinic for sick visit based on back surgery non healing incision.   Reports only one episode of "gagging"  this am. Weights are stable around 180 - 182 lbs.   Optivol performed today with sporadic non-sustained episodes of atrial fib/tach. Pt denies palpitations, dizzy spells, or syncopal events. Currently on Amiodarone 200 mg daily.   INR 1.57 --> Goal 2 - 2.5, no ASA, Plavix. See anticoag note.   LDH --> baseline 220 - 300 (stopped Plavix last hospitalization), has had some flow/power elevations.   Creat ---> up from 1.03 at hospital discharge; pt has taken prn Lasix 20 mg three times since discharge due to right leg swelling.   Lumbar incisions have had small amount serous drainage; during clinic visit today, noted to have increased amount of bloody drainage per wife. Dressing applied; pt has f/u appointment with Dr. Vertell Limber this afternoon.   Patient Instructions: 1. Change back dressing every two days; pack with aquacel silver strips. 2. Take coumadin 4 mg today then increase weekly dose to 2 mg daily except 4 mg on Mon/Thurs. 3. Increase protein shakes to two daily for wound healing. 4. Wound check next week 5. Return to [facility] in one month.    Kirk Ruths, RN [place] Coordinator  Office: [phone] 24/7 VAD Pager: [phone]

## 2016-03-21 ENCOUNTER — Encounter (HOSPITAL_COMMUNITY): Payer: Self-pay | Admitting: Pharmacist

## 2016-03-22 ENCOUNTER — Telehealth (HOSPITAL_COMMUNITY): Payer: Self-pay

## 2016-03-22 NOTE — Telephone Encounter (Signed)
[employer] stating Ran[employer]a is tier 4-covered at 50%. Asking to call to make tier exception. Will forward to CHF clinical pharmacist Doroteo Bradford.  Renee Pain

## 2016-03-23 ENCOUNTER — Ambulatory Visit (HOSPITAL_COMMUNITY): Payer: Self-pay | Admitting: *Deleted

## 2016-03-23 LAB — POCT INR: INR: 1.8

## 2016-03-28 ENCOUNTER — Other Ambulatory Visit (HOSPITAL_COMMUNITY): Payer: Self-pay | Admitting: Unknown Physician Specialty

## 2016-03-28 ENCOUNTER — Ambulatory Visit (HOSPITAL_COMMUNITY): Payer: Self-pay | Admitting: Unknown Physician Specialty

## 2016-03-28 ENCOUNTER — Ambulatory Visit (HOSPITAL_COMMUNITY)
Admission: RE | Admit: 2016-03-28 | Discharge: 2016-03-28 | Disposition: A | Discharge status: Home / Self Care | Payer: Medicare Other | Source: Ambulatory Visit | Attending: Cardiology | Admitting: Cardiology

## 2016-03-28 VITALS — BP 91/63 | HR 61 | Ht 72.0 in | Wt 194.6 lb

## 2016-03-28 DIAGNOSIS — I130 Hypertensive heart and chronic kidney disease with heart failure and stage 1 through stage 4 chronic kidney disease, or unspecified chronic kidney disease: Secondary | ICD-10-CM | POA: Insufficient documentation

## 2016-03-28 DIAGNOSIS — I4891 Unspecified atrial fibrillation: Secondary | ICD-10-CM | POA: Diagnosis not present

## 2016-03-28 DIAGNOSIS — Z79899 Other long term (current) drug therapy: Secondary | ICD-10-CM | POA: Diagnosis not present

## 2016-03-28 DIAGNOSIS — Z7901 Long term (current) use of anticoagulants: Secondary | ICD-10-CM | POA: Diagnosis not present

## 2016-03-28 DIAGNOSIS — Z95811 Presence of heart assist device: Secondary | ICD-10-CM

## 2016-03-28 DIAGNOSIS — Z981 Arthrodesis status: Secondary | ICD-10-CM | POA: Diagnosis not present

## 2016-03-28 DIAGNOSIS — T827XXD Infection and inflammatory reaction due to other cardiac and vascular devices, implants and grafts, subsequent encounter: Secondary | ICD-10-CM

## 2016-03-28 DIAGNOSIS — Z95828 Presence of other vascular implants and grafts: Secondary | ICD-10-CM | POA: Insufficient documentation

## 2016-03-28 DIAGNOSIS — I5022 Chronic systolic (congestive) heart failure: Secondary | ICD-10-CM | POA: Insufficient documentation

## 2016-03-28 DIAGNOSIS — Z8673 Personal history of transient ischemic attack (TIA), and cerebral infarction without residual deficits: Secondary | ICD-10-CM | POA: Diagnosis not present

## 2016-03-28 DIAGNOSIS — Z7902 Long term (current) use of antithrombotics/antiplatelets: Secondary | ICD-10-CM | POA: Diagnosis not present

## 2016-03-28 DIAGNOSIS — I158 Other secondary hypertension: Secondary | ICD-10-CM

## 2016-03-28 DIAGNOSIS — I2510 Atherosclerotic heart disease of native coronary artery without angina pectoris: Secondary | ICD-10-CM | POA: Insufficient documentation

## 2016-03-28 DIAGNOSIS — I255 Ischemic cardiomyopathy: Secondary | ICD-10-CM | POA: Diagnosis not present

## 2016-03-28 DIAGNOSIS — G4730 Sleep apnea, unspecified: Secondary | ICD-10-CM | POA: Diagnosis not present

## 2016-03-28 DIAGNOSIS — I739 Peripheral vascular disease, unspecified: Secondary | ICD-10-CM | POA: Diagnosis not present

## 2016-03-28 DIAGNOSIS — Z87442 Personal history of urinary calculi: Secondary | ICD-10-CM | POA: Diagnosis not present

## 2016-03-28 DIAGNOSIS — Z85820 Personal history of malignant melanoma of skin: Secondary | ICD-10-CM | POA: Insufficient documentation

## 2016-03-28 DIAGNOSIS — E785 Hyperlipidemia, unspecified: Secondary | ICD-10-CM | POA: Diagnosis not present

## 2016-03-28 DIAGNOSIS — E039 Hypothyroidism, unspecified: Secondary | ICD-10-CM | POA: Insufficient documentation

## 2016-03-28 DIAGNOSIS — I252 Old myocardial infarction: Secondary | ICD-10-CM | POA: Diagnosis not present

## 2016-03-28 DIAGNOSIS — S31000A Unspecified open wound of lower back and pelvis without penetration into retroperitoneum, initial encounter: Secondary | ICD-10-CM | POA: Diagnosis not present

## 2016-03-28 DIAGNOSIS — N183 Chronic kidney disease, stage 3 unspecified: Secondary | ICD-10-CM

## 2016-03-28 DIAGNOSIS — I471 Supraventricular tachycardia: Secondary | ICD-10-CM | POA: Insufficient documentation

## 2016-03-28 DIAGNOSIS — T829XXD Unspecified complication of cardiac and vascular prosthetic device, implant and graft, subsequent encounter: Secondary | ICD-10-CM

## 2016-03-28 LAB — BASIC METABOLIC PANEL
Anion gap: 6 (ref 5–15)
BUN: 42 mg/dL — AB (ref 6–20)
CALCIUM: 9.4 mg/dL (ref 8.9–10.3)
CO2: 25 mmol/L (ref 22–32)
CREATININE: 1.44 mg/dL — AB (ref 0.61–1.24)
Chloride: 108 mmol/L (ref 101–111)
GFR calc non Af Amer: 46 mL/min — ABNORMAL LOW (ref >60)
GFR, EST AFRICAN AMERICAN: 53 mL/min — AB (ref >60)
GLUCOSE: 91 mg/dL (ref 65–99)
Potassium: 4.1 mmol/L (ref 3.5–5.1)
Sodium: 139 mmol/L (ref 135–145)

## 2016-03-28 LAB — LACTATE DEHYDROGENASE: LDH: 290 U/L — AB (ref 98–192)

## 2016-03-28 LAB — CBC
HCT: 38.5 % — ABNORMAL LOW (ref 39.0–52.0)
HEMOGLOBIN: 12.2 g/dL — AB (ref 13.0–17.0)
MCH: 30.7 pg (ref 26.0–34.0)
MCHC: 31.7 g/dL (ref 30.0–36.0)
MCV: 97 fL (ref 78.0–100.0)
Platelets: 157 10*3/uL (ref 150–400)
RBC: 3.97 MIL/uL — AB (ref 4.22–5.81)
RDW: 18.1 % — ABNORMAL HIGH (ref 11.5–15.5)
WBC: 8 10*3/uL (ref 4.0–10.5)

## 2016-03-28 LAB — POCT INR: INR: 2.2

## 2016-03-28 LAB — PREALBUMIN: Prealbumin: 25.8 mg/dL (ref 18–38)

## 2016-03-28 NOTE — Progress Notes (Addendum)
Patient ID: [name], male   DOB: 03/07/1941, 75 y.o.   MRN: [id number]   VAD CLINIC PROGRESS NOTE  PCP: Dr. Kandice Robinsons ([place]) GU: Dr Risa Grill  Neuro Surgeon: Dr Vertell Limber Vascular: Dr Kellie Simmering Primary HF: Dr Haroldine Laws  [name] is a 75 yo male with h/o VT, PAD and severe systolic HF (EF 0000000) due to mixed ischemic/nonischemic CM he is s/p HM II LVAD implant for destination therapy on October 19, 2013. On 09/01/14 underwent cystoscopy and flexible ureteroscopy with lithotripsy and basket extraction.   VAD implantation was complicated by VT, syncope, A fib/Aflutter and persistent low output felt to be due to RHF. Required short term milrinone.   GU Event 09/2015 Hematuria and chronic nephrolithiasis. Renal US showed mild hyronephrosis and bilateral renal cysts. Dr. Risa Grill took back for repeat lithotripsy and stone extraction by ureteroscopy and J stent placement.  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  01/2016 R CEA. He was started on Plavix.  01/2016 Lumbar fusion L3-L5 with pedicle screws posterolateral arthrodesis with BMP, allograft  Follow up LVAD Clinic: Presents for LVAD follow and back wound assessment. Overall feeling ok. Denies SOB/PND/Orthopnea. Minimal exudate noted. He has not had chills or fever.   Taking all medications. No  palpitations. No bleeding, dark stools, neuro symptoms or dark urine. Has not had lasix. Ambulating with walker.   LVAD interrogation reveals  Speed: 9000 Flow: 3.7 Power: 4.5 PI: 5.3 Alarms: none Events:~10-20 PI events Fixed speed: 9000 Low speed limit: 8400 Primary Controller: Replace back up battery in 29 months  Back up controller: Replace back up battery in 29 months   LVAD interrogated and functioning as expected. NEGATIVE for significant PI Events or speed drops.    Past Medical History  Diagnosis Date  .  Bradycardia   . Ventricular tachycardia ([place])     a. s/p AICD;   h/o ICD shock;  Amiodarone Rx. S/P ablation in 2012  . PVD (peripheral vascular disease) ([place])     a. 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 HTN (hypertension)   . HLD (hyperlipidemia)   . Cytopenia   . Hypothyroidism   . CKD (chronic kidney disease)   . Chronic systolic heart failure ([place])     a. s/p LVAD 10/2013 for destination therapy  . Ischemic cardiomyopathy   . CVD (cerebrovascular disease)     a. s/p L CEA 2008  . Stroke [facility])     a. 10/2015 admission   . Inappropriate therapy from implantable cardioverter-defibrillator     a. ATP for sinus tach SVT wavelet identified SVT but was no  passive (2014)  . Automatic implantable cardioverter-defibrillator in situ     a. s/p Medtronic Evera device serial [id number]    . Melanoma of ear ([place])     a. L Ear   . PAF (paroxysmal atrial fibrillation) ([place])   . CAD (coronary artery disease)     a. s/p MI 1982;  s/p DES to CFX 2011;  s/p NSTEMI 4/12 in setting of VTach;  cath 7/12:  LM ok, LAD mid 30-40%, Dx's with 40%, mCFX stent patent, prox to mid RCA occluded with R to R and L to R collats.  Medical Tx  was continued  . History of GI bleed     a. on ASA- started on plavix during 10/2015 admission for CVA  . Myocardial infarction ([place])     1992  . Dysrhythmia     AFIB  . Sleep apnea     NO CPAP NOW  HE SENT IT BACK YRS AGO  . Shortness of breath dyspnea     W/ EXERTION   . Arthritis     Current Outpatient Prescriptions  Medication Sig Dispense Refill  . amiodarone (PACERONE) 200 MG tablet Take 1 tablet (200 mg total) by mouth daily. 32 tablet 6  . amLODipine (NORVASC) 10 MG tablet Take 1 tablet (10 mg total) by mouth at bedtime. 30 tablet 6  . atorvastatin (LIPITOR) 80 MG tablet Take 80 mg by mouth daily.     . clopidogrel (PLAVIX) 75 MG tablet Take 1 tablet (75 mg  total) by mouth daily. 90 tablet 3  . docusate sodium (COLACE) 100 MG capsule Take 100 mg by mouth daily.     . furosemide (LASIX) 20 MG tablet Take 1 tablet (20 mg total) by mouth as needed for edema (weight gain). 30 tablet 3  . levothyroxine (SYNTHROID, LEVOTHROID) 200 MCG tablet Take one 200 mcg daily before breakfast every day with 1 1/2 (300 mcg) every Monday. (Patient taking differently: Take 200-300 mcg by mouth daily before breakfast. Take 1 tablet (256mcg) all days except on Mondays take 1&1/2 tablets (351mcg)) 45 tablet 6  . metoprolol succinate (TOPROL-XL) 25 MG 24 hr tablet Take 1 tablet (25 mg total) by mouth 2 (two) times daily. 60 tablet 3  . Multiple Vitamins-Minerals (ICAPS PO) Take 1 tablet by mouth 2 (two) times daily.    . pantoprazole (PROTONIX) 40 MG tablet TAKE 1 TABLET EVERY 12 HOURS. 60 tablet 3  . ranolazine (RANEXA) 500 MG 12 hr tablet TAKE 1 TABLET (500 MG TOTAL) BY MOUTH 2 (TWO) TIMES DAILY. 60 tablet 6  . warfarin (COUMADIN) 2 MG tablet Take 2-4 mg by mouth daily. Take 1 tablet (2mg ) all days except on Monday take 2 tablets (4mg )    . zinc gluconate 50 MG tablet Take 50 mg by mouth 2 (two) times daily.      No current facility-administered medications for this encounter.    Demerol  REVIEW OF SYSTEMS: All systems negative except as listed in HPI, PMH and Problem list.   Vital signs: HR: 61 Doppler MAP: 76 Auto cuff: ---91/63 O2 Sat: 97% Wt: 194.6 lbs Ht: 6'0"  Physical Exam: GENERAL: NAD HEENT: normal NECK: Supple, JVP 6-7  no carotid bruits. R CEA scar well-healed No lymphadenopathy or thyromegaly appreciated.   CARDIAC:  Mechanical heart sounds with LVAD hum present.  LUNGS:  clear.  ABDOMEN:  Soft, round, nontender, positive bowel sounds x4.   LVAD exit site: well-healed and incorporated.  Dressing dry and intact.  No erythema or drainage.  Stabilization device present and accurately applied.  Driveline dressing is being changed weekly per sterile  technique. EXTREMITIES:  Warm and dry, no cyanosis, clubbing, trace edema  NEUROLOGIC:  Alert and oriented x 4.  Gait steady.  No aphasia.  No dysarthria.  Affect pleasant. Grossly normal bilateral upper and lower extremity strength.  Wound: lumbar wound full thickness 4x3x1 cm 50% slough 50% pale pink. Undermining noted. Periwound intact. No odor.   ASSESSMENT AND PLAN:  1. Chronic systolic HF: s/p LVAD [id number].  Remains NYHA II-III.   - Volume status stable. Creatinine stable today.  Take lasix only PRN.  2.  Atrial arrhythmias: Atrial fibrillation, atrial tachycardia.  S/p DC-CV 06/04/14, DC-CV 03/02/15 and 01/19/16.  - Regular rhythm. Will need to continue  amio 200 mg daily.  3. HTN: MAP stable.  4. LVAD: LVAD parameters reviewed personally, stable.  5. CKD, stage III:  Creatinine stable today. Reminded to take lasix only PRN 6. Anticoagulation management: Goal INR 2-2.5. He is off ASA with GI bleeding. Restart  plavix. INR 1.57. See anticoag noted.   7. Nephrolithiasis s/p Renal Stone lithotripsy: Per Dr Risa Grill. Stable. 8. 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 Continue coumadin and restart plavix will check CBC today and next week. No bleeding problems.  Todays hemoglobin is stable. 12.2.  9. Lumbar compression fracture: s/p L3-L5 fusion 02/03/16. He has full thickness wound from hematoma. I performed sharp debridement using scissor and forcep with slough removed. No bleeding. 4x3x1cm with undermining noted. Applied 250 mg of Acell . Covered with dry gauze. Plan to reassess next week. May need more ACell next week.  10. 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  Continue atorvastatin. (also warfarin INR goal 2-2.5). S/p R CEA 01/13/16. Continue  plavix.   Follow up next week to reassess wound.  Amy Clegg  NP-C 03/28/2016

## 2016-03-28 NOTE — Patient Instructions (Signed)
1..  Wound check next week on 5/31 @11am . 2.  Return to [facility] June 15.

## 2016-03-28 NOTE — Progress Notes (Signed)
Presents today with [name] (significant other).   Symptom  Yes  No  Details   Angina       x Activity:   Claudication        x How far:   Syncope        x When:     Stroke        x        TIA  2001  Orthopnea         x How many pillows: 1  PND         x How often:  CPAP       N/A How many hrs:   Pedal edema        x        R>L baseline   Abd fullness               x    N&V               x   Diaphoresis               x   Bleeding              x   Urine color        yellow  SOB        x      Activity: incline, no more than normal  Palpitations        x When:   ICD shock        x   Hospitlizaitons               x When/where/why:   ED visit        x When/where/why:  Other MD        x       When/who/why:    Activity            Fluid    No limitations   Diet    Low salt food   Vital signs: HR: 61 Doppler MAP: 76 Auto cuff: 91/63 O2 Sat:  97% Wt:   194.6lbs Last wt: 192.6 lbs  Ht: 6'0"  LVAD interrogation reveals (Reviewed with Dr Aundra Dubin):  Speed:   9000 Flow:  3.7 Power:  4.5 PI:  5.3 Alarms: none Events:  @10 - 20 PI events daily Fixed speed:  9000 Low speed limit:  8400 Primary Controller:  Replace back up battery in 28 months   Back up controller:   Replace back up battery in 28 months   LVAD interrogated and functioning as expected. Interrogation Negative for elevated flows or powers; no significant change in number of PI events or speed drops.   Driveline Site: Drive line site fully healed and incorporated. Velour is fully implanted at the site. Wife is performing weekly dressing changes using Sorbaview dressings with Biopatch on exit site. Provided dressing supplies for 1 month.   Encounter Details:  Pt presents for wound management of surgical incision from spine surgery. Darrick Grinder, NP managing wound. Wound culture done today. Wound debridement and cleaned with saline performed NP. Mepitel and Acel applied to wound base and covered with occulsive dry gauze.      Pt denies palpitations, reports occasional orthostatic dizziness, denies syncopal events. Currently on Amiodarone 200 mg daily.     INR  2.2 --> Goal 2 - 2.5, no ASA, no Plavix, but will restart per Darrick Grinder, NP. See anticoag note.   LDH 290 --> baseline 220 -  330 (stopped Plavix March hospitalization), no flow/power elevations noted this visit.   Creat 1.44  ---> up from 1.03 at hospital discharge; pt has not taken prn Lasix 20 since last visit.    Patient Instructions:   1..  Wound check next week on 5/31 @11am . 2.  Return to [facility] June 15.  Tanda Rockers, RN VAD Coordinator  Office: [phone] 24/7 VAD Pager: [phone]

## 2016-03-29 ENCOUNTER — Encounter (HOSPITAL_COMMUNITY): Payer: Medicare Other

## 2016-03-29 ENCOUNTER — Encounter: Payer: Self-pay | Admitting: Internal Medicine

## 2016-03-29 ENCOUNTER — Encounter (HOSPITAL_COMMUNITY): Payer: Self-pay | Admitting: Pharmacist

## 2016-03-30 ENCOUNTER — Telehealth (HOSPITAL_COMMUNITY): Payer: Self-pay | Admitting: Pharmacist

## 2016-03-30 LAB — AEROBIC CULTURE W GRAM STAIN (SUPERFICIAL SPECIMEN): Culture: NO GROWTH

## 2016-03-30 NOTE — Telephone Encounter (Signed)
Ranexa tier exception approved by [place]. Patient just picked up 1 month supply on 5/16 for $160. Should be cheaper when pick up next month.   Ruta Hinds. [name], PharmD, BCPS, CPP Clinical Pharmacist Pager: [phone] Phone: [phone] 03/30/2016 3:33 PM

## 2016-04-04 ENCOUNTER — Ambulatory Visit (HOSPITAL_COMMUNITY): Payer: Self-pay | Admitting: Infectious Diseases

## 2016-04-04 ENCOUNTER — Encounter (HOSPITAL_COMMUNITY): Payer: Self-pay

## 2016-04-04 ENCOUNTER — Ambulatory Visit (HOSPITAL_COMMUNITY)
Admission: RE | Admit: 2016-04-04 | Discharge: 2016-04-04 | Disposition: A | Discharge status: Home / Self Care | Payer: Medicare Other | Source: Ambulatory Visit | Attending: Internal Medicine | Admitting: Internal Medicine

## 2016-04-04 ENCOUNTER — Telehealth (HOSPITAL_COMMUNITY): Payer: Self-pay

## 2016-04-04 VITALS — BP 103/77 | HR 64 | Wt 194.6 lb

## 2016-04-04 DIAGNOSIS — Z7902 Long term (current) use of antithrombotics/antiplatelets: Secondary | ICD-10-CM | POA: Diagnosis not present

## 2016-04-04 DIAGNOSIS — Z955 Presence of coronary angioplasty implant and graft: Secondary | ICD-10-CM | POA: Diagnosis not present

## 2016-04-04 DIAGNOSIS — Z95811 Presence of heart assist device: Secondary | ICD-10-CM | POA: Insufficient documentation

## 2016-04-04 DIAGNOSIS — I130 Hypertensive heart and chronic kidney disease with heart failure and stage 1 through stage 4 chronic kidney disease, or unspecified chronic kidney disease: Secondary | ICD-10-CM | POA: Diagnosis not present

## 2016-04-04 DIAGNOSIS — Z7901 Long term (current) use of anticoagulants: Secondary | ICD-10-CM | POA: Diagnosis not present

## 2016-04-04 DIAGNOSIS — E785 Hyperlipidemia, unspecified: Secondary | ICD-10-CM | POA: Diagnosis not present

## 2016-04-04 DIAGNOSIS — E039 Hypothyroidism, unspecified: Secondary | ICD-10-CM | POA: Diagnosis not present

## 2016-04-04 DIAGNOSIS — Z85820 Personal history of malignant melanoma of skin: Secondary | ICD-10-CM | POA: Diagnosis not present

## 2016-04-04 DIAGNOSIS — N183 Chronic kidney disease, stage 3 unspecified: Secondary | ICD-10-CM

## 2016-04-04 DIAGNOSIS — I5022 Chronic systolic (congestive) heart failure: Secondary | ICD-10-CM | POA: Diagnosis present

## 2016-04-04 DIAGNOSIS — I739 Peripheral vascular disease, unspecified: Secondary | ICD-10-CM | POA: Insufficient documentation

## 2016-04-04 DIAGNOSIS — Z981 Arthrodesis status: Secondary | ICD-10-CM | POA: Insufficient documentation

## 2016-04-04 DIAGNOSIS — I4891 Unspecified atrial fibrillation: Secondary | ICD-10-CM | POA: Insufficient documentation

## 2016-04-04 DIAGNOSIS — Z87442 Personal history of urinary calculi: Secondary | ICD-10-CM | POA: Insufficient documentation

## 2016-04-04 DIAGNOSIS — Z95828 Presence of other vascular implants and grafts: Secondary | ICD-10-CM | POA: Diagnosis not present

## 2016-04-04 DIAGNOSIS — G4730 Sleep apnea, unspecified: Secondary | ICD-10-CM | POA: Diagnosis not present

## 2016-04-04 DIAGNOSIS — Z79899 Other long term (current) drug therapy: Secondary | ICD-10-CM | POA: Insufficient documentation

## 2016-04-04 DIAGNOSIS — I4892 Unspecified atrial flutter: Secondary | ICD-10-CM | POA: Diagnosis not present

## 2016-04-04 DIAGNOSIS — Z8673 Personal history of transient ischemic attack (TIA), and cerebral infarction without residual deficits: Secondary | ICD-10-CM | POA: Insufficient documentation

## 2016-04-04 DIAGNOSIS — I252 Old myocardial infarction: Secondary | ICD-10-CM | POA: Diagnosis not present

## 2016-04-04 DIAGNOSIS — S31000D Unspecified open wound of lower back and pelvis without penetration into retroperitoneum, subsequent encounter: Secondary | ICD-10-CM

## 2016-04-04 DIAGNOSIS — I2510 Atherosclerotic heart disease of native coronary artery without angina pectoris: Secondary | ICD-10-CM | POA: Diagnosis not present

## 2016-04-04 DIAGNOSIS — I471 Supraventricular tachycardia: Secondary | ICD-10-CM | POA: Insufficient documentation

## 2016-04-04 LAB — POCT INR: INR: 1.6

## 2016-04-04 MED ORDER — AMIODARONE HCL 200 MG PO TABS: 100.0000 mg | ORAL_TABLET | Freq: Every day | ORAL | Status: DC

## 2016-04-04 NOTE — Progress Notes (Signed)
Presents today with [name] (significant other).   Symptom  Yes  No  Details   Angina       x Activity:   Claudication        x How far:   Syncope        x When:     Stroke        x        TIA  2001, TIA 10/2015  Orthopnea         x How many pillows: 1  PND         x How often:  CPAP       N/A How many hrs:   Pedal edema        x        R>L baseline   Abd fullness               x    N&V               x   Diaphoresis               x   Bleeding              x   Urine color        yellow  SOB        x      Activity: incline, no more than normal  Palpitations        x When:   ICD shock        x   Hospitlizaitons               x When/where/why:   ED visit        x When/where/why:  Other MD        x       When/who/why:  Dr. Vertell Limber last week  Activity            Fluid    No limitations   Diet    Low salt food   Vital signs: HR: 64 Doppler MAP: 84 Auto cuff: 103/59 (77) O2 Sat: 97% Wt: 194.6lbs Last wt: 194.6 lbs  Ht: 6'0"  LVAD interrogation reveals (Reviewed with Dr Aundra Dubin):  Speed: 9000 Flow: 4.1 Power: 4.6w PI: 7.1 Alarms: none Events:  @10 - 20 PI events daily, baseline Fixed speed:  9000 Low speed limit:  8400 Primary Controller: Replace back up battery in 28 months   Back up controller: Replace back up battery in 28 months   LVAD interrogated and functioning as expected. Interrogation Negative for elevated flows or powers; no significant change in number of PI events or speed drops.   Driveline Site: Drive line site fully healed and incorporated. Velour is fully implanted at the site. Wife is performing weekly dressing changes using Sorbaview dressings with Biopatch on exit site. Provided dressing supplies for 1 month.   Encounter Details: Pt presents for wound management of surgical incision from spine surgery Vertell Limber). Darrick Grinder, NP managing wound. Wound culture reveiwed--and no organisms seen. Completed Keflex at a prior encounter with Dr. Vertell Limber. Wound with minimal  improvement compared to last week. 3cm x 2.5cm. Brown drainage to the old dressing--needed to be reinforced per his SO. Acell applied to wound again today (about 1/3 of bottle).     Pt denies palpitations, reports occasional orthostatic dizziness, denies syncopal events. Currently on Amiodarone 200 mg daily.     INR 1.6 --> Goal 2 - 2.5, no ASA, 75 mg  Plavix. See anticoag note.   INTERMACs 2.5 year to be done at next visit on 04/11/16 @ 8:30. Per Amy will check TSH/CMET at this visit since Amiodarone has been resumed for a few months now (previous Hx of thyrotoxicity).   Med Changes: Decrease Amiodarone to 100 mg QD, Warfarin increased 2 mg QD with 4 mg Q M/W/F.   Janene Madeira, RN VAD Coordinator   Office: [phone] 24/7 VAD Pager: [phone]

## 2016-04-04 NOTE — Telephone Encounter (Signed)
Discussed during clinic visit today. See anticoagulation note for details regarding management.

## 2016-04-04 NOTE — Progress Notes (Signed)
Patient ID: [name], male   DOB: [birth year], 75 y.o.   MRN: [id number]   VAD CLINIC PROGRESS NOTE  PCP: Dr. Kandice Robinsons ([place]) GU: Dr Risa Grill  Neuro Surgeon: Dr Vertell Limber Vascular: Dr Kellie Simmering Primary HF: Dr Haroldine Laws  [name] is a 75 yo male with h/o VT, PAD and severe systolic HF (EF 0000000) due to mixed ischemic/nonischemic CM he is s/p HM II LVAD implant for destination therapy on October 19, 2013. On 09/01/14 underwent cystoscopy and flexible ureteroscopy with lithotripsy and basket extraction.   VAD implantation was complicated by VT, syncope, A fib/Aflutter and persistent low output felt to be due to RHF. Required short term milrinone.   GU Event 09/2015 Hematuria and chronic nephrolithiasis. Renal US showed mild hyronephrosis and bilateral renal cysts. Dr. Risa Grill took back for repeat lithotripsy and stone extraction by ureteroscopy and J stent placement.  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  01/2016 R CEA. He was started on Plavix.  01/2016 Lumbar fusion L3-L5 with pedicle screws posterolateral arthrodesis with BMP, allograft  Follow up LVAD Clinic: Presents for LVAD follow and back wound assessment. Overall feeling ok. Denies SOB/PND/Orthopnea. He has not had chills or fever.  Taking all medications. No  palpitations. No bleeding, dark stools, neuro symptoms or dark urine. Has not had lasix. Ambulating without a walker.   LVAD interrogation reveals  Speed: 9000 Flow: 4.1  Power: 4.6 PI:7.1  Alarms: none Events:~10-20 PI events Fixed speed: 9000 Low speed limit: 8400 Primary Controller: Replace back up battery in 29 months  Back up controller: Replace back up battery in 29 months   LVAD interrogated and functioning as expected. NEGATIVE for significant PI Events or speed drops.    Past Medical History  Diagnosis Date  . Bradycardia   .  Ventricular tachycardia ([place])     a. s/p AICD;   h/o ICD shock;  Amiodarone Rx. S/P ablation in 2012  . PVD (peripheral vascular disease) ([place])     a. 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 HTN (hypertension)   . HLD (hyperlipidemia)   . Cytopenia   . Hypothyroidism   . CKD (chronic kidney disease)   . Chronic systolic heart failure ([place])     a. s/p LVAD 10/2013 for destination therapy  . Ischemic cardiomyopathy   . CVD (cerebrovascular disease)     a. s/p L CEA 2008  . Stroke [facility])     a. 10/2015 admission   . Inappropriate therapy from implantable cardioverter-defibrillator     a. ATP for sinus tach SVT wavelet identified SVT but was no  passive (2014)  . Automatic implantable cardioverter-defibrillator in situ     a. s/p Medtronic Evera device serial [id number]    . 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 L Ear   . PAF (paroxysmal atrial fibrillation) ([place])   . CAD (coronary artery disease)     a. s/p MI 1982;  s/p DES to CFX 2011;  s/p NSTEMI 4/12 in setting of VTach;  cath 7/12:  LM ok, LAD mid 30-40%, Dx's with 40%, mCFX stent patent, prox to mid RCA occluded with R to R and L to R collats.  Medical Tx was continued  .  History of GI bleed     a. on ASA- started on plavix during 10/2015 admission for CVA  . Myocardial infarction ([place])     1992  . Dysrhythmia     AFIB  . Sleep apnea     NO CPAP NOW  HE SENT IT BACK YRS AGO  . Shortness of breath dyspnea     W/ EXERTION   . Arthritis     Current Outpatient Prescriptions  Medication Sig Dispense Refill  . amiodarone (PACERONE) 200 MG tablet Take 0.5 tablets (100 mg total) by mouth daily. 30 tablet 6  . amLODipine (NORVASC) 10 MG tablet Take 1 tablet (10 mg total) by mouth at bedtime. 30 tablet 6  . atorvastatin (LIPITOR) 80 MG tablet Take 80 mg by mouth daily.     . clopidogrel (PLAVIX) 75 MG tablet Take 1 tablet (75 mg total) by mouth  daily. 90 tablet 3  . docusate sodium (COLACE) 100 MG capsule Take 100 mg by mouth daily.     . furosemide (LASIX) 20 MG tablet Take 1 tablet (20 mg total) by mouth as needed for edema (weight gain). 30 tablet 3  . levothyroxine (SYNTHROID, LEVOTHROID) 200 MCG tablet Take one 200 mcg daily before breakfast every day with 1 1/2 (300 mcg) every Monday. (Patient taking differently: Take 200-300 mcg by mouth daily before breakfast. Take 1 tablet (240mcg) all days except on Mondays take 1&1/2 tablets (348mcg)) 45 tablet 6  . metoprolol succinate (TOPROL-XL) 25 MG 24 hr tablet Take 1 tablet (25 mg total) by mouth 2 (two) times daily. 60 tablet 3  . Multiple Vitamins-Minerals (ICAPS PO) Take 1 tablet by mouth 2 (two) times daily.    . pantoprazole (PROTONIX) 40 MG tablet TAKE 1 TABLET EVERY 12 HOURS. 60 tablet 3  . ranolazine (RANEXA) 500 MG 12 hr tablet TAKE 1 TABLET (500 MG TOTAL) BY MOUTH 2 (TWO) TIMES DAILY. 60 tablet 6  . warfarin (COUMADIN) 2 MG tablet Take 2-4 mg by mouth daily. Take 1 tablet (2mg ) all days except on Monday take 2 tablets (4mg )    . zinc gluconate 50 MG tablet Take 50 mg by mouth 2 (two) times daily.      No current facility-administered medications for this encounter.    Demerol  REVIEW OF SYSTEMS: All systems negative except as listed in HPI, PMH and Problem list.   Vital signs: HR: 64 Doppler MAP: 84 Auto cuff: 103/59 (77) O2 Sat: 97% Wt: 194.6 lbs Ht: 6'0"  Physical Exam: GENERAL: NAD. Ambulated in the clinic without difficulty  HEENT: normal NECK: Supple, JVP 6-7  no carotid bruits. R CEA scar well-healed No lymphadenopathy or thyromegaly appreciated.   CARDIAC:  Mechanical heart sounds with LVAD hum present.  LUNGS:  clear.  ABDOMEN:  Soft, round, nontender, positive bowel sounds x4.   LVAD exit site: well-healed and incorporated.  Dressing dry and intact.  No erythema or drainage.  Stabilization device present and accurately applied.  Driveline dressing is  being changed weekly per sterile technique. EXTREMITIES:  Warm and dry, no cyanosis, clubbing, trace edema  NEUROLOGIC:  Alert and oriented x 4.  Gait steady.  No aphasia.  No dysarthria.  Affect pleasant. Grossly normal bilateral upper and lower extremity strength.  Wound: lumbar wound full thickness  3.5 x2.5 x1cm. Undermining noted. Periwound intact. No odor.   ASSESSMENT AND PLAN:  1. Chronic systolic HF: s/p LVAD 123XX123.  Remains NYHA II.   - Volume status stable. Creatinine stable  today. Take lasix only PRN.  2.  Atrial arrhythmias: Atrial fibrillation, atrial tachycardia.  S/p DC-CV 06/04/14, DC-CV 03/02/15 and 01/19/16.  - Regular rhythm. Cut back amio to 100 mg daily.   3. HTN: MAP stable.  4. LVAD: LVAD parameters reviewed personally, stable.  5. CKD, stage III:  Creatinine stable today. Reminded to take lasix only PRN 6. Anticoagulation management: Goal INR 2-2.5. He is off ASA with GI bleeding. Restart  plavix. INR 1.6. See anticoag note.    7. Nephrolithiasis s/p Renal Stone lithotripsy: Per Dr Risa Grill. Stable. 8. 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 Continue coumadin and restart plavix will check CBC today and next week. No bleeding problems.  Todays hemoglobin is stable. 12.2.  9. Lumbar compression fracture: s/p L3-L5 fusion 02/03/16. He has full thickness wound from hematoma.  4x3x1cm with undermining noted. Applied 250 mg of Acell . Covered with dry gauze/mepitel and surgilube.  Plan to reassess next week. Will need  ACell next week.  10. 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  Continue atorvastatin. (also warfarin INR goal 2-2.5). S/p R CEA 01/13/16. Continue  plavix.   Follow up next week to reassess wound and reapply A cell. A cell rep will also be present next week. Check TSH and CMET next visit.   Amy Clegg NP-C 04/04/2016  Yes  No  Details    Angina   x Activity:   Claudication    x How far:   Syncope    x When:     Stroke   x   TIA 2001, TIA 10/2015  Orthopnea    x How many pillows: 1  PND    x How often:  CPAP    N/A How many hrs:   Pedal edema   x   R>L baseline   Abd fullness     x    N&V     x   Diaphoresis     x   Bleeding    x   Urine color    yellow  SOB   x   Activity: incline, no more than normal  Palpitations    x When:   ICD shock    x   Hospitlizaitons     x When/where/why:   ED visit    x When/where/why:  Other MD   x   When/who/why: Dr. Vertell Limber last week  Activity        Fluid    No limitations   Diet    Low salt food   Vital signs: HR: 64 Doppler MAP: 84 Auto cuff: 103/59 (77) O2 Sat: 97% Wt: 194.6lbs Last wt: 194.6 lbs  Ht: 6'0"  LVAD interrogation reveals (Reviewed with Dr Aundra Dubin):  Speed: 9000 Flow: 4.1 Power: 4.6w PI: 7.1 Alarms: none Events: @10 - 20 PI events daily, baseline Fixed speed: 9000 Low speed limit: 8400 Primary Controller: Replace back up battery in 28 months  Back up controller: Replace back up battery in 28 months   LVAD interrogated and functioning as expected. Interrogation Negative for elevated flows or powers; no significant change in number of PI events or speed drops.   Driveline Site: Drive line site fully healed and incorporated. Velour is fully implanted at the site. Wife is performing weekly dressing changes using Sorbaview dressings with Biopatch on exit site. Provided dressing supplies for 1 month.   Encounter Details: Pt presents for wound management of surgical incision from spine surgery Vertell Limber). Darrick Grinder, NP managing  wound. Wound culture reveiwed--and no organisms seen. Completed Keflex at a prior encounter with Dr. Vertell Limber. Wound with minimal improvement compared to last week. 3cm x 2.5cm. Brown drainage to the old dressing--needed  to be reinforced per his SO. Acell applied to wound again today (about 1/3 of bottle).     Pt denies palpitations, reports occasional orthostatic dizziness, denies syncopal events. Currently on Amiodarone 200 mg daily.   INR 1.6 --> Goal 2 - 2.5, no ASA, 75 mg Plavix. See anticoag note.   INTERMACs 2.5 year to be done at next visit on 04/11/16 @ 8:30. Per Amy will check TSH/CMET at this visit since Amiodarone has been resumed for a few months now (previous Hx of thyrotoxicity).   Med Changes: Decrease Amiodarone to 100 mg QD, Warfarin increased 2 mg QD with 4 mg Q M/W/F.   Janene Madeira, RN VAD Coordinator   Office: [phone] 24/7 VAD Pager: [phone]

## 2016-04-04 NOTE — Telephone Encounter (Signed)
Report of INR 1.6 today, will forward to LVAD Coordinators

## 2016-04-04 NOTE — Patient Instructions (Signed)
1. Decrease your Amiodarone to 100 mg (1/2 pill) once a day.   2. Return to clinic on Wednesday June 7th at 0830 for another wound change. Plan on being here a little longer to do a 6 min walk test and to fill out some quality of life surveys to celebrate your 2.5 year VADiversary.

## 2016-04-09 ENCOUNTER — Encounter (HOSPITAL_COMMUNITY): Payer: Medicare Other

## 2016-04-09 ENCOUNTER — Ambulatory Visit: Payer: Medicare Other | Admitting: Vascular Surgery

## 2016-04-10 ENCOUNTER — Encounter (HOSPITAL_COMMUNITY): Payer: Medicare Other

## 2016-04-10 ENCOUNTER — Ambulatory Visit: Payer: Medicare Other | Admitting: Vascular Surgery

## 2016-04-11 ENCOUNTER — Encounter (HOSPITAL_COMMUNITY): Payer: Self-pay | Admitting: Unknown Physician Specialty

## 2016-04-11 ENCOUNTER — Ambulatory Visit (HOSPITAL_COMMUNITY): Payer: Self-pay | Admitting: Unknown Physician Specialty

## 2016-04-11 ENCOUNTER — Ambulatory Visit (HOSPITAL_COMMUNITY)
Admission: RE | Admit: 2016-04-11 | Discharge: 2016-04-11 | Disposition: A | Discharge status: Home / Self Care | Payer: Medicare Other | Source: Ambulatory Visit | Attending: Internal Medicine | Admitting: Internal Medicine

## 2016-04-11 DIAGNOSIS — I2510 Atherosclerotic heart disease of native coronary artery without angina pectoris: Secondary | ICD-10-CM | POA: Insufficient documentation

## 2016-04-11 DIAGNOSIS — Z85820 Personal history of malignant melanoma of skin: Secondary | ICD-10-CM | POA: Diagnosis not present

## 2016-04-11 DIAGNOSIS — M1990 Unspecified osteoarthritis, unspecified site: Secondary | ICD-10-CM | POA: Diagnosis not present

## 2016-04-11 DIAGNOSIS — N183 Chronic kidney disease, stage 3 unspecified: Secondary | ICD-10-CM

## 2016-04-11 DIAGNOSIS — I252 Old myocardial infarction: Secondary | ICD-10-CM | POA: Diagnosis not present

## 2016-04-11 DIAGNOSIS — Z95811 Presence of heart assist device: Secondary | ICD-10-CM | POA: Diagnosis not present

## 2016-04-11 DIAGNOSIS — Z95828 Presence of other vascular implants and grafts: Secondary | ICD-10-CM | POA: Diagnosis not present

## 2016-04-11 DIAGNOSIS — Z87442 Personal history of urinary calculi: Secondary | ICD-10-CM | POA: Insufficient documentation

## 2016-04-11 DIAGNOSIS — I130 Hypertensive heart and chronic kidney disease with heart failure and stage 1 through stage 4 chronic kidney disease, or unspecified chronic kidney disease: Secondary | ICD-10-CM | POA: Diagnosis not present

## 2016-04-11 DIAGNOSIS — E039 Hypothyroidism, unspecified: Secondary | ICD-10-CM | POA: Diagnosis not present

## 2016-04-11 DIAGNOSIS — E785 Hyperlipidemia, unspecified: Secondary | ICD-10-CM | POA: Insufficient documentation

## 2016-04-11 DIAGNOSIS — Z79899 Other long term (current) drug therapy: Secondary | ICD-10-CM | POA: Diagnosis not present

## 2016-04-11 DIAGNOSIS — S31000S Unspecified open wound of lower back and pelvis without penetration into retroperitoneum, sequela: Secondary | ICD-10-CM

## 2016-04-11 DIAGNOSIS — Z7901 Long term (current) use of anticoagulants: Secondary | ICD-10-CM | POA: Insufficient documentation

## 2016-04-11 DIAGNOSIS — Z981 Arthrodesis status: Secondary | ICD-10-CM | POA: Diagnosis not present

## 2016-04-11 DIAGNOSIS — I4891 Unspecified atrial fibrillation: Secondary | ICD-10-CM | POA: Insufficient documentation

## 2016-04-11 DIAGNOSIS — I5022 Chronic systolic (congestive) heart failure: Secondary | ICD-10-CM | POA: Diagnosis present

## 2016-04-11 DIAGNOSIS — Z8719 Personal history of other diseases of the digestive system: Secondary | ICD-10-CM

## 2016-04-11 DIAGNOSIS — Z8673 Personal history of transient ischemic attack (TIA), and cerebral infarction without residual deficits: Secondary | ICD-10-CM | POA: Insufficient documentation

## 2016-04-11 DIAGNOSIS — I159 Secondary hypertension, unspecified: Secondary | ICD-10-CM

## 2016-04-11 LAB — POCT INR: INR: 2.1

## 2016-04-11 NOTE — Progress Notes (Signed)
[facility] in [place] notified that Mr. Yatsko will be traveling to there area on the dates 6/9-6/13. Last Clinic note faxed to [phone]. Direct 24/7 line to reach their VAD coordinator is [phone]. I spoke with Katharine Look 1 of 4 VAD coordinators at this facility.

## 2016-04-11 NOTE — Patient Instructions (Addendum)
1. Take 20 mg of Lasix today. 2. Return to clinic in one week for wound check.    [facility] to [facility]:  [place] [phone], [place] [address]  [phone] -->ask for the VAD Coordinator On-Call  Get directions (48.29mi)

## 2016-04-11 NOTE — Progress Notes (Signed)
Patient ID: PRINTIS ECKENROD, male   DOB: 02/12/1941, 75 y.o.   MRN: [id number] Patient ID: HAMED FINKENBINDER, male   DOB: 1941/05/08, 75 y.o.   MRN: [id number]   VAD CLINIC PROGRESS NOTE  PCP: Dr. Kandice Robinsons ([place]) GU: Dr Risa Grill  Neuro Surgeon: Dr Vertell Limber Vascular: Dr Kellie Simmering Primary HF: Dr Haroldine Laws  Devvin Zwicker is a 75 yo male with h/o VT, PAD and severe systolic HF (EF 0000000) due to mixed ischemic/nonischemic CM he is s/p HM II LVAD implant for destination therapy on October 19, 2013. On 09/01/14 underwent cystoscopy and flexible ureteroscopy with lithotripsy and basket extraction.   VAD implantation was complicated by VT, syncope, A fib/Aflutter and persistent low output felt to be due to RHF. Required short term milrinone.   GU Event 09/2015 Hematuria and chronic nephrolithiasis. Renal US showed mild hyronephrosis and bilateral renal cysts. Dr. Risa Grill took back for repeat lithotripsy and stone extraction by ureteroscopy and J stent placement.  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  01/2016 R CEA. He was started on Plavix.  01/2016 Lumbar fusion L3-L5 with pedicle screws posterolateral arthrodesis with BMP, allograft  Follow up LVAD Clinic: Presents for LVAD follow up and back wound assessment. Overall feeling ok. Denies SOB/PND/Orthopnea. He has not had chills or fever.  Weight at home trending up 202 pounds. Has not had lasix. Increased leg edema. Good appetite. Taking all medications. No  palpitations. No bleeding, dark stools, neuro symptoms or dark urine. Has not had lasix. Ambulating without a walker.   LVAD interrogation reveals  Speed: 9000 Flow:3.4  Power: 4.4  PI:5.8  Alarms: none Events:~20-40 PI events Fixed speed: 9000 Low speed limit: 8400 Primary Controller: Replace back up battery in 29 months  Back up controller: Replace back up battery in 29  months   LVAD interrogated and functioning as expected. NEGATIVE for significant PI Events or speed drops.    Past Medical History  Diagnosis Date  . Bradycardia   . Ventricular tachycardia ([place])     a. s/p AICD;   h/o ICD shock;  Amiodarone Rx. S/P ablation in 2012  . PVD (peripheral vascular disease) ([place])     a. 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 HTN (hypertension)   . HLD (hyperlipidemia)   . Cytopenia   . Hypothyroidism   . CKD (chronic kidney disease)   . Chronic systolic heart failure ([place])     a. s/p LVAD 10/2013 for destination therapy  . Ischemic cardiomyopathy   . CVD (cerebrovascular disease)     a. s/p L CEA 2008  . Stroke [facility])     a. 10/2015 admission   . Inappropriate therapy from implantable cardioverter-defibrillator     a. ATP for sinus tach SVT wavelet identified SVT but was no  passive (2014)  . Automatic implantable cardioverter-defibrillator in situ     a. s/p Medtronic Evera device serial [id number]    . 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 L Ear   . PAF (paroxysmal atrial fibrillation) ([place])   . CAD (coronary artery disease)     a. s/p MI 1982;  s/p DES to CFX 2011;  s/p NSTEMI 4/12 in setting of VTach;  cath 7/12:  LM ok, LAD mid 30-40%, Dx's with 40%, mCFX stent patent, prox to mid RCA occluded with R to R and L to R collats.  Medical Tx was continued  . History of GI bleed     a. on ASA- started on plavix during 10/2015 admission for CVA  . Myocardial infarction ([place])     1992  . Dysrhythmia     AFIB  . Sleep apnea     NO CPAP NOW  HE SENT IT BACK YRS AGO  . Shortness of breath dyspnea     W/ EXERTION   . Arthritis     Current Outpatient Prescriptions  Medication Sig Dispense Refill  . amiodarone (PACERONE) 200 MG tablet Take 0.5 tablets (100 mg total) by mouth daily. 30 tablet 6  . amLODipine (NORVASC) 10 MG tablet Take 1 tablet (10 mg total) by  mouth at bedtime. 30 tablet 6  . atorvastatin (LIPITOR) 80 MG tablet Take 80 mg by mouth daily.     . clopidogrel (PLAVIX) 75 MG tablet Take 1 tablet (75 mg total) by mouth daily. 90 tablet 3  . docusate sodium (COLACE) 100 MG capsule Take 100 mg by mouth daily.     . furosemide (LASIX) 20 MG tablet Take 1 tablet (20 mg total) by mouth as needed for edema (weight gain). 30 tablet 3  . levothyroxine (SYNTHROID, LEVOTHROID) 200 MCG tablet Take one 200 mcg daily before breakfast every day with 1 1/2 (300 mcg) every Monday. (Patient taking differently: Take 200-300 mcg by mouth daily before breakfast. Take 1 tablet (244mcg) all days except on Mondays take 1&1/2 tablets (358mcg)) 45 tablet 6  . metoprolol succinate (TOPROL-XL) 25 MG 24 hr tablet Take 1 tablet (25 mg total) by mouth 2 (two) times daily. 60 tablet 3  . Multiple Vitamins-Minerals (ICAPS PO) Take 1 tablet by mouth 2 (two) times daily.    . pantoprazole (PROTONIX) 40 MG tablet TAKE 1 TABLET EVERY 12 HOURS. 60 tablet 3  . ranolazine (RANEXA) 500 MG 12 hr tablet TAKE 1 TABLET (500 MG TOTAL) BY MOUTH 2 (TWO) TIMES DAILY. 60 tablet 6  . warfarin (COUMADIN) 2 MG tablet Take 2-4 mg by mouth daily. Take 1 tablet (2mg ) all days except on Monday take 2 tablets (4mg )    . zinc gluconate 50 MG tablet Take 50 mg by mouth 2 (two) times daily.      No current facility-administered medications for this encounter.    Demerol  REVIEW OF SYSTEMS: All systems negative except as listed in HPI, PMH and Problem list.   Vital signs: HR: 61 Doppler MAP: 88 Auto cuff: 113/68 (797) O2 Sat: 97% Wt: 202  lbs Ht: 6'0"  Physical Exam: GENERAL: NAD. Ambulated in the clinic without difficulty  HEENT: normal NECK: Supple, JVP 9-10  no carotid bruits. R CEA scar well-healed No lymphadenopathy or thyromegaly appreciated.   CARDIAC:  Mechanical heart sounds with LVAD hum present.  LUNGS:  clear.  ABDOMEN:  Soft, round, nontender, positive bowel sounds x4.     LVAD exit site: well-healed and incorporated.  Dressing dry and intact.  No erythema or drainage.  Stabilization device present and accurately applied.  Driveline dressing is being changed weekly per sterile technique. EXTREMITIES:  Warm and dry, no cyanosis, clubbing, R and LLE trace-1+ edema  NEUROLOGIC:  Alert and oriented x 4.  Gait steady.  No aphasia.  No dysarthria.  Affect pleasant. Grossly normal bilateral upper and lower extremity strength.  Wound: lumbar wound full thickness  3.5 x2.5 x1cm. Undermining noted. Periwound intact. No odor.   ASSESSMENT AND PLAN:  1. Chronic systolic HF: s/p LVAD 123XX123.  Remains NYHA II.   - Volume status mildly elevated. Today I have asked him to take 20 mg lasix. Check BMET next week.   2.  Atrial arrhythmias: Atrial fibrillation, atrial tachycardia.  S/p DC-CV 06/04/14, DC-CV 03/02/15 and 01/19/16.  - Regular rhythm. Continue amio to 100 mg daily.   3. HTN: MAP stable.  4. LVAD: LVAD parameters reviewed personally, stable.  5. CKD, stage III:  Creatinine stable today. Reminded to take lasix only PRN 6. Anticoagulation management: Goal INR 2-2.5. He is off ASA with GI bleeding. Restart  plavix. INR at home 2.1. See anticoag note.    7. Nephrolithiasis s/p Renal Stone lithotripsy: Per Dr Risa Grill. Stable. 8. 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 Continue coumadin and restart plavix will check CBC today and next week. No bleeding problems.    9. Lumbar compression fracture: s/p L3-L5 fusion 02/03/16. He has full thickness wound from hematoma.  4x3x1cm with undermining noted. Applied 250 mg of Acell powderas well as Acell sheet over Ac . Covered with dry gauze/mepitel and surgilube.  Plan to reassess next week. Will need  ACell next week.  10. 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  Continue atorvastatin. (also warfarin INR goal 2-2.5). S/p R CEA 01/13/16. Continue  plavix.   Follow up next week to reassess wound and reapply A cell.     Graylee Arutyunyan NP-C 04/11/2016

## 2016-04-11 NOTE — Progress Notes (Signed)
   Yes  No  Details    Angina   x Activity:   Claudication    x How far:   Syncope    x When:   Stroke   x   TIA 2001, TIA 10/2015  Orthopnea    x How many pillows: 1  PND    x How often:  CPAP    N/A How many hrs:   Pedal edema   x   R>L baseline   Abd fullness     x    N&V     x   Diaphoresis     x   Bleeding    x   Urine color    yellow  SOB   x   Activity: incline, no more than normal  Palpitations    x When:   ICD shock    x   Hospitlizaitons     x When/where/why:   ED visit    x When/where/why:  Other MD   x   When/who/why: Dr. Vertell Limber last week  Activity        Fluid    No limitations   Diet    Low salt food   Vital signs: HR: 61 Doppler: 88 BP: 113/68 (79) Wt:202.8 lbs Last wt: 194.6 lbs  Ht: 6'0"   LVAD Parameters: Flow: 3.4 Speed: 9000 PI: 5.8 Power: 4.4 Alarms: none Events: 20-40 events per day  Driveline Site: Drive line site fully healed and incorporated. Velour is fully implanted at the site. Wife is performing weekly dressing changes using Sorbaview dressings with Biopatch on exit site. Provided dressing supplies for 1 month.   Encounter Details: Pt presents for wound management of surgical incision from spine surgery Vertell Limber). Darrick Grinder, NP managing wound. Wound culture reveiwed--and no organisms seen. Completed Keflex at a prior encounter with Dr. Vertell Limber. Wound well improvd compared to last week. 3cm x 2.5cm.  Acell powder applied to wound again today (about 1/3 of bottle), Along with Acell sheet. Dressing applied.      Pt denies palpitations, reports occasional orthostatic dizziness, denies syncopal events. Currently on Amiodarone 200 mg daily.   INR 2.1--> Goal 2 - 2.5, no ASA, 75 mg Plavix. See anticoag note.    INTERMACs 2.5 year completed at this visit.   Med Changes: Take a 20mg  of Lasix today.    Tanda Rockers, RN VAD Coordinator   Office: [phone] 24/7 VAD Pager: [phone]

## 2016-04-17 LAB — CUP PACEART REMOTE DEVICE CHECK
Battery Remaining Longevity: 67 mo
Brady Statistic AP VP Percent: 0.79 %
Brady Statistic AS VP Percent: 0.02 %
Brady Statistic RA Percent Paced: 87.62 %
Brady Statistic RV Percent Paced: 0.81 %
HIGH POWER IMPEDANCE MEASURED VALUE: 36 Ohm
HIGH POWER IMPEDANCE MEASURED VALUE: 47 Ohm
Implantable Lead Implant Date: 20140801
Implantable Lead Location: 753859
Implantable Lead Model: 5592
Lead Channel Impedance Value: 399 Ohm
Lead Channel Impedance Value: 475 Ohm
Lead Channel Pacing Threshold Pulse Width: 0.4 ms
Lead Channel Pacing Threshold Pulse Width: 0.4 ms
Lead Channel Sensing Intrinsic Amplitude: 1.125 mV
Lead Channel Sensing Intrinsic Amplitude: 1.125 mV
Lead Channel Setting Pacing Amplitude: 2 V
Lead Channel Setting Pacing Amplitude: 2 V
Lead Channel Setting Pacing Pulse Width: 0.4 ms
Lead Channel Setting Sensing Sensitivity: 0.3 mV
MDC IDC LEAD IMPLANT DT: 20030714
MDC IDC LEAD LOCATION: 753860
MDC IDC LEAD MODEL: 6944
MDC IDC MSMT BATTERY VOLTAGE: 2.99 V
MDC IDC MSMT LEADCHNL RA PACING THRESHOLD AMPLITUDE: 0.875 V
MDC IDC MSMT LEADCHNL RA SENSING INTR AMPL: 2.25 mV
MDC IDC MSMT LEADCHNL RA SENSING INTR AMPL: 2.25 mV
MDC IDC MSMT LEADCHNL RV IMPEDANCE VALUE: 589 Ohm
MDC IDC MSMT LEADCHNL RV PACING THRESHOLD AMPLITUDE: 0.75 V
MDC IDC SESS DTM: 20170508151806
MDC IDC STAT BRADY AP VS PERCENT: 86.83 %
MDC IDC STAT BRADY AS VS PERCENT: 12.36 %

## 2016-04-18 ENCOUNTER — Encounter: Payer: Self-pay | Admitting: Cardiology

## 2016-04-18 ENCOUNTER — Ambulatory Visit (HOSPITAL_COMMUNITY): Payer: Self-pay | Admitting: Unknown Physician Specialty

## 2016-04-18 LAB — POCT INR: INR: 1.9

## 2016-04-19 ENCOUNTER — Encounter (HOSPITAL_COMMUNITY): Payer: Self-pay

## 2016-04-19 ENCOUNTER — Ambulatory Visit (HOSPITAL_COMMUNITY)
Admission: RE | Admit: 2016-04-19 | Discharge: 2016-04-19 | Disposition: A | Discharge status: Home / Self Care | Payer: Medicare Other | Source: Ambulatory Visit | Attending: Internal Medicine | Admitting: Internal Medicine

## 2016-04-19 ENCOUNTER — Other Ambulatory Visit (HOSPITAL_COMMUNITY): Payer: Self-pay | Admitting: Infectious Diseases

## 2016-04-19 VITALS — BP 95/70 | HR 63 | Ht 72.0 in | Wt 203.6 lb

## 2016-04-19 DIAGNOSIS — Z95811 Presence of heart assist device: Secondary | ICD-10-CM | POA: Diagnosis not present

## 2016-04-19 DIAGNOSIS — I2510 Atherosclerotic heart disease of native coronary artery without angina pectoris: Secondary | ICD-10-CM | POA: Diagnosis not present

## 2016-04-19 DIAGNOSIS — I252 Old myocardial infarction: Secondary | ICD-10-CM | POA: Diagnosis not present

## 2016-04-19 DIAGNOSIS — Z95828 Presence of other vascular implants and grafts: Secondary | ICD-10-CM | POA: Diagnosis not present

## 2016-04-19 DIAGNOSIS — Z79899 Other long term (current) drug therapy: Secondary | ICD-10-CM | POA: Diagnosis not present

## 2016-04-19 DIAGNOSIS — I5022 Chronic systolic (congestive) heart failure: Secondary | ICD-10-CM | POA: Insufficient documentation

## 2016-04-19 DIAGNOSIS — Z87442 Personal history of urinary calculi: Secondary | ICD-10-CM | POA: Diagnosis not present

## 2016-04-19 DIAGNOSIS — Z85820 Personal history of malignant melanoma of skin: Secondary | ICD-10-CM | POA: Diagnosis not present

## 2016-04-19 DIAGNOSIS — I130 Hypertensive heart and chronic kidney disease with heart failure and stage 1 through stage 4 chronic kidney disease, or unspecified chronic kidney disease: Secondary | ICD-10-CM | POA: Diagnosis not present

## 2016-04-19 DIAGNOSIS — Z95810 Presence of automatic (implantable) cardiac defibrillator: Secondary | ICD-10-CM | POA: Diagnosis not present

## 2016-04-19 DIAGNOSIS — M1990 Unspecified osteoarthritis, unspecified site: Secondary | ICD-10-CM | POA: Diagnosis not present

## 2016-04-19 DIAGNOSIS — I480 Paroxysmal atrial fibrillation: Secondary | ICD-10-CM | POA: Diagnosis not present

## 2016-04-19 DIAGNOSIS — I4891 Unspecified atrial fibrillation: Secondary | ICD-10-CM | POA: Insufficient documentation

## 2016-04-19 DIAGNOSIS — N183 Chronic kidney disease, stage 3 (moderate): Secondary | ICD-10-CM | POA: Diagnosis not present

## 2016-04-19 DIAGNOSIS — E785 Hyperlipidemia, unspecified: Secondary | ICD-10-CM | POA: Diagnosis not present

## 2016-04-19 DIAGNOSIS — G4730 Sleep apnea, unspecified: Secondary | ICD-10-CM | POA: Insufficient documentation

## 2016-04-19 DIAGNOSIS — E039 Hypothyroidism, unspecified: Secondary | ICD-10-CM | POA: Insufficient documentation

## 2016-04-19 DIAGNOSIS — Z7901 Long term (current) use of anticoagulants: Secondary | ICD-10-CM | POA: Insufficient documentation

## 2016-04-19 DIAGNOSIS — Z7902 Long term (current) use of antithrombotics/antiplatelets: Secondary | ICD-10-CM | POA: Insufficient documentation

## 2016-04-19 DIAGNOSIS — Z8673 Personal history of transient ischemic attack (TIA), and cerebral infarction without residual deficits: Secondary | ICD-10-CM | POA: Diagnosis not present

## 2016-04-19 DIAGNOSIS — Z981 Arthrodesis status: Secondary | ICD-10-CM | POA: Insufficient documentation

## 2016-04-19 LAB — CBC
HCT: 35.9 % — ABNORMAL LOW (ref 39.0–52.0)
Hemoglobin: 11.5 g/dL — ABNORMAL LOW (ref 13.0–17.0)
MCH: 31.1 pg (ref 26.0–34.0)
MCHC: 32 g/dL (ref 30.0–36.0)
MCV: 97 fL (ref 78.0–100.0)
PLATELETS: 152 10*3/uL (ref 150–400)
RBC: 3.7 MIL/uL — AB (ref 4.22–5.81)
RDW: 16.4 % — ABNORMAL HIGH (ref 11.5–15.5)
WBC: 5.7 10*3/uL (ref 4.0–10.5)

## 2016-04-19 LAB — BASIC METABOLIC PANEL
Anion gap: 5 (ref 5–15)
BUN: 24 mg/dL — ABNORMAL HIGH (ref 6–20)
CALCIUM: 9.2 mg/dL (ref 8.9–10.3)
CO2: 25 mmol/L (ref 22–32)
CREATININE: 1.55 mg/dL — AB (ref 0.61–1.24)
Chloride: 108 mmol/L (ref 101–111)
GFR, EST AFRICAN AMERICAN: 48 mL/min — AB (ref >60)
GFR, EST NON AFRICAN AMERICAN: 42 mL/min — AB (ref >60)
GLUCOSE: 92 mg/dL (ref 65–99)
Potassium: 4.7 mmol/L (ref 3.5–5.1)
Sodium: 138 mmol/L (ref 135–145)

## 2016-04-19 LAB — LACTATE DEHYDROGENASE: LDH: 288 U/L — AB (ref 98–192)

## 2016-04-19 NOTE — Progress Notes (Signed)
Presents today with [name] (significant other).   Symptom  Yes  No  Details   Angina       x Activity:   Claudication        x How far:   Syncope        x When:     Stroke        x       TIA  2001, TIA 10/2015  Orthopnea         x How many pillows: 1  PND         x How often:  CPAP       N/A How many hrs:   Pedal edema        x        R>L baseline   Abd fullness               x    N&V               x   Diaphoresis               x   Bleeding              x   Urine color        yellow  SOB        x      Activity: incline, no more than normal  Palpitations        x When:   ICD shock        x   Hospitlizaitons               x When/where/why:   ED visit        x When/where/why:  Other MD              x   Activity        Recently visited [place]. Had some back pain walking around - did not use walking aid.    Fluid    No limitations   Diet    Low salt food   Vital signs: HR: 67 Doppler MAP: 98 Auto cuff: 95/70 (84) O2 Sat: 98% Wt: 203.6 lbs  Last wt: 194.6 lbs Ht: 6'0"  LVAD interrogation reveals (Reviewed with Dr Haroldine Laws):  Speed: 9000 Flow: 3.7 (some '---') Power:4.5 w PI: 7.1 (3.8 - 7.1 range)   Alarms: one LOW FLOW 04/14/16 06:17 - patient did not hear alarm Events: 5 - 20 daily with some outliers on 04/17/16 with 44 events (driving home in car for ~ 10 hours).   Fixed speed:  9000 Low speed limit:  8400  Primary Controller: Replace back up battery in 27 months   Back up controller: Replace back up battery in 28 months   LVAD interrogated and functioning as expected. Interrogation Negative for elevated flows or powers; no significant change in number of PI events or speed drops.   Driveline Site: Drive line site fully healed and incorporated. Velour is fully implanted at the site. [name] is performing weekly dressing changes using Sorbaview dressings with Biopatch on exit site. Provided dressing supplies for 1 month.   Lumbar fusion incision dressing removed  today. Healing very well compared to last change. Increased yellow/slough drainage expected with use of Acell matrix and powder. Deepest part of wound bed is 1cm pictured at the 11 o'clock position below however the majority of the wound bed is <0.5cm deep. Wound measures 2.5 x 3cm.  Under the instruction of  Amy Clegg I reapplied about 1/4th of the 0.5g vial of Acell powder to the wound bed, applied Mepitel and dry gauze/tape dressing. No gel needed with wound bed being more moist than previous encounters.     Encounter Details: Presents to clinic with his SO [name] for VAD FU and wound care for lumbar incision. R leg swelling today more than normal. Increased PI events recently up to 44 events. Was on vacation at [facility] recently and these events correlate to the days he was driving in the car for ~10 hours. Has not taken diuretics in some time. Fluid status assessed by Dr. Haroldine Laws today to be slightly elevated - hold off on diuretics with low flow alarm. Pt denies palpitations, reports occasional orthostatic dizziness, denies syncopal events. Currently on Amiodarone 100 mg daily.     EKG assessed to R/O afib - APaced / SR 60s.   MAP 84 on current regimen.   LABS:  INR performed at home yesterday 1.9 --> Goal 2 - 2.5, no ASA, 75 mg Plavix. See anticoag note.   Hgb 11.2. Stable on anticoagulation. No BRBPR, melena or signs of bleeding.   LDH stable at 288 with established baseline of 200 - 320. Denies tea colored urine.   Creatinine 1.55 today - 62m ago was 1.03. Only taking diuretic PRN.   Med Changes: none   RTC in 1 week for re-interrogation of LVAD and dressing change/Acell application.   Janene Madeira, RN VAD Coordinator   Office: [phone] 24/7 VAD Pager:  [phone]

## 2016-04-19 NOTE — Progress Notes (Signed)
Patient ID: [name], male   DOB: 1941-09-19, 75 y.o.   MRN: [id number]   VAD CLINIC PROGRESS NOTE  PCP: Dr. Kandice Robinsons ([place]) GU: Dr Risa Grill  Neuro Surgeon: Dr Vertell Limber Vascular: Dr Kellie Simmering Primary HF: Dr Haroldine Laws  [name] is a 75 yo male with h/o VT, PAD and severe systolic HF (EF 0000000) due to mixed ischemic/nonischemic CM he is s/p HM II LVAD implant for destination therapy on October 19, 2013. On 09/01/14 underwent cystoscopy and flexible ureteroscopy with lithotripsy and basket extraction.   VAD implantation was complicated by VT, syncope, A fib/Aflutter and persistent low output felt to be due to RHF. Required short term milrinone.   GU Event 09/2015 Hematuria and chronic nephrolithiasis. Renal US showed mild hyronephrosis and bilateral renal cysts. Dr. Risa Grill took back for repeat lithotripsy and stone extraction by ureteroscopy and J stent placement.  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  01/2016 R CEA. He was started on Plavix.  01/2016 Lumbar fusion L3-L5 with pedicle screws posterolateral arthrodesis with BMP, allograft  Follow up LVAD Clinic: Presents for LVAD follow up.. Overall feeling ok. Recently got back from trip to [facility]. Still with significant back pain but feels it is better. Wound is healing. No fevers, chills or drainage. Denies SOB/PND/Orthopnea.  Weight at home stable. Takes lasix as needed. Good appetite. Taking all medications. No  palpitations. No bleeding, dark stools, neuro symptoms or dark urine. Ambulating without a walker.   LVAD interrogation reveals (Reviewed with Dr Haroldine Laws):  Speed: 9000 Flow: 3.7 (some '---') Power:4.5 w PI: 7.1 (3.8 - 7.1 range)   Alarms: one LOW FLOW 04/14/16 06:17 - patient did not hear alarm Events: 5 - 20 daily with some outliers on 04/17/16 with 44 events (driving home in car for ~ 10 hours).     Past Medical History  Diagnosis Date  . Bradycardia   . Ventricular tachycardia ([place])     a. s/p AICD;   h/o ICD shock;  Amiodarone Rx. S/P ablation in 2012  . PVD (peripheral vascular disease) ([place])     a. 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 HTN (hypertension)   . HLD (hyperlipidemia)   . Cytopenia   . Hypothyroidism   . CKD (chronic kidney disease)   . Chronic systolic heart failure ([place])     a. s/p LVAD 10/2013 for destination therapy  . Ischemic cardiomyopathy   . CVD (cerebrovascular disease)     a. s/p L CEA 2008  . Stroke [facility])     a. 10/2015 admission   . Inappropriate therapy from implantable cardioverter-defibrillator     a. ATP for sinus tach SVT wavelet identified SVT but was no  passive (2014)  . Automatic implantable cardioverter-defibrillator in situ     a. s/p Medtronic Evera device serial [id number]    . 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 L Ear   . PAF (paroxysmal atrial fibrillation) ([place])   . CAD (coronary artery disease)     a. s/p MI 1982;  s/p DES to CFX 2011;  s/p NSTEMI 4/12 in setting of VTach;  cath 7/12:  LM ok, LAD mid 30-40%, Dx's with 40%, mCFX stent patent, prox  to mid RCA occluded with R to R and L to R collats.  Medical Tx was continued  . History of GI bleed     a. on ASA- started on plavix during 10/2015 admission for CVA  . Myocardial infarction ([place])     1992  . Dysrhythmia     AFIB  . Sleep apnea     NO CPAP NOW  HE SENT IT BACK YRS AGO  . Shortness of breath dyspnea     W/ EXERTION   . Arthritis     Current Outpatient Prescriptions  Medication Sig Dispense Refill  . amiodarone (PACERONE) 200 MG tablet Take 0.5 tablets (100 mg total) by mouth daily. 30 tablet 6  . amLODipine (NORVASC) 10 MG tablet Take 1 tablet (10 mg total) by mouth at bedtime. 30 tablet 6  . atorvastatin (LIPITOR) 80 MG tablet Take 80 mg by mouth daily.     .  clopidogrel (PLAVIX) 75 MG tablet Take 1 tablet (75 mg total) by mouth daily. 90 tablet 3  . docusate sodium (COLACE) 100 MG capsule Take 100 mg by mouth daily.     . furosemide (LASIX) 20 MG tablet Take 1 tablet (20 mg total) by mouth as needed for edema (weight gain). 30 tablet 3  . levothyroxine (SYNTHROID, LEVOTHROID) 200 MCG tablet Take one 200 mcg daily before breakfast every day with 1 1/2 (300 mcg) every Monday. (Patient taking differently: Take 200-300 mcg by mouth daily before breakfast. Take 1 tablet (289mcg) all days except on Mondays take 1&1/2 tablets (316mcg)) 45 tablet 6  . metoprolol succinate (TOPROL-XL) 25 MG 24 hr tablet Take 1 tablet (25 mg total) by mouth 2 (two) times daily. 60 tablet 3  . Multiple Vitamins-Minerals (ICAPS PO) Take 1 tablet by mouth 2 (two) times daily.    . pantoprazole (PROTONIX) 40 MG tablet TAKE 1 TABLET EVERY 12 HOURS. 60 tablet 3  . ranolazine (RANEXA) 500 MG 12 hr tablet TAKE 1 TABLET (500 MG TOTAL) BY MOUTH 2 (TWO) TIMES DAILY. 60 tablet 6  . warfarin (COUMADIN) 2 MG tablet Take 2-4 mg by mouth daily. Take 1 tablet (2mg ) all days except on Monday take 2 tablets (4mg )    . zinc gluconate 50 MG tablet Take 50 mg by mouth 2 (two) times daily.      No current facility-administered medications for this encounter.    Demerol  REVIEW OF SYSTEMS: All systems negative except as listed in HPI, PMH and Problem list.   Vital signs: HR: 67 Doppler MAP: 98 Auto cuff: 95/70 (84) O2 Sat: 98% Wt: 203.6 lbs  Last wt: 194.6 lbs Ht: 6'0"  Physical Exam: GENERAL: NAD. Ambulated in the clinic without difficulty  HEENT: normal NECK: Supple, JVP 7-8  no carotid bruits. R CEA scar well-healed No lymphadenopathy or thyromegaly appreciated.   CARDIAC:  Mechanical heart sounds with LVAD hum present.  LUNGS:  clear.  ABDOMEN:  Soft, round, nontender, positive bowel sounds x4.   LVAD exit site: well-healed and incorporated.  Dressing dry and intact.  No erythema  or drainage.  Stabilization device present and accurately applied.  Driveline dressing is being changed weekly per sterile technique. EXTREMITIES:  Warm and dry, no cyanosis, clubbing, R and LLE trace edema  NEUROLOGIC:  Alert and oriented x 4.  Gait steady.  No aphasia.  No dysarthria.  Affect pleasant. Grossly normal bilateral upper and lower extremity strength.  Wound: lumbar wound. Granulating.[name] Periwound intact. No odor.   ASSESSMENT AND  PLAN:  1. Chronic systolic HF: s/p LVAD [id number].  Remains NYHA II.   - Volume status ok. Continue to take lasix as needed  Check BMET next week.   2.  Atrial arrhythmias: Atrial fibrillation, atrial tachycardia.  S/p DC-CV 06/04/14, DC-CV 03/02/15 and 01/19/16.  - Regular rhythm. Continue amio to 100 mg daily.   3. HTN: MAP stable.  4. LVAD: LVAD parameters reviewed personally, He is having some low flows. No clear reason. ECG obtained today and still in NSR with PACs. (No AF), Volume status ok. Consider ramp echo if persists. 5. CKD, stage III:  Creatinine stable today. Reminded to take lasix only PRN 6. Anticoagulation management: Goal INR 2-2.5. He is off ASA with GI bleeding. Continue Plavix.   7. Nephrolithiasis s/p Renal Stone lithotripsy: Per Dr Risa Grill. Stable. 8. 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 Continue coumadin and plavix.(with h/o CVA) check CBC. . No bleeding problems.    9. Lumbar compression fracture: s/p L3-L5 fusion 02/03/16. He has full thickness wound from hematoma.  Healing well. Followed closely by Darrick Grinder, NP  10. 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  Continue atorvastatin. (also warfarin INR goal 2-2.5). S/p R CEA 01/13/16. Continue  plavix.    Glori Bickers MD 04/19/2016

## 2016-04-25 ENCOUNTER — Encounter (HOSPITAL_COMMUNITY): Payer: Self-pay

## 2016-04-25 ENCOUNTER — Ambulatory Visit (HOSPITAL_COMMUNITY)
Admission: RE | Admit: 2016-04-25 | Discharge: 2016-04-25 | Disposition: A | Discharge status: Home / Self Care | Payer: Medicare Other | Source: Ambulatory Visit | Attending: Cardiology | Admitting: Cardiology

## 2016-04-25 VITALS — BP 98/48 | HR 60 | Ht 72.0 in | Wt 202.0 lb

## 2016-04-25 DIAGNOSIS — M1990 Unspecified osteoarthritis, unspecified site: Secondary | ICD-10-CM | POA: Diagnosis not present

## 2016-04-25 DIAGNOSIS — E785 Hyperlipidemia, unspecified: Secondary | ICD-10-CM | POA: Diagnosis not present

## 2016-04-25 DIAGNOSIS — Z95828 Presence of other vascular implants and grafts: Secondary | ICD-10-CM | POA: Diagnosis not present

## 2016-04-25 DIAGNOSIS — Z95811 Presence of heart assist device: Secondary | ICD-10-CM | POA: Insufficient documentation

## 2016-04-25 DIAGNOSIS — Z955 Presence of coronary angioplasty implant and graft: Secondary | ICD-10-CM | POA: Diagnosis not present

## 2016-04-25 DIAGNOSIS — Z981 Arthrodesis status: Secondary | ICD-10-CM | POA: Diagnosis not present

## 2016-04-25 DIAGNOSIS — I252 Old myocardial infarction: Secondary | ICD-10-CM | POA: Insufficient documentation

## 2016-04-25 DIAGNOSIS — I4891 Unspecified atrial fibrillation: Secondary | ICD-10-CM | POA: Insufficient documentation

## 2016-04-25 DIAGNOSIS — I255 Ischemic cardiomyopathy: Secondary | ICD-10-CM | POA: Insufficient documentation

## 2016-04-25 DIAGNOSIS — I428 Other cardiomyopathies: Secondary | ICD-10-CM | POA: Diagnosis not present

## 2016-04-25 DIAGNOSIS — I130 Hypertensive heart and chronic kidney disease with heart failure and stage 1 through stage 4 chronic kidney disease, or unspecified chronic kidney disease: Secondary | ICD-10-CM | POA: Insufficient documentation

## 2016-04-25 DIAGNOSIS — Z79899 Other long term (current) drug therapy: Secondary | ICD-10-CM | POA: Diagnosis not present

## 2016-04-25 DIAGNOSIS — S31000D Unspecified open wound of lower back and pelvis without penetration into retroperitoneum, subsequent encounter: Secondary | ICD-10-CM

## 2016-04-25 DIAGNOSIS — N183 Chronic kidney disease, stage 3 unspecified: Secondary | ICD-10-CM

## 2016-04-25 DIAGNOSIS — Z8673 Personal history of transient ischemic attack (TIA), and cerebral infarction without residual deficits: Secondary | ICD-10-CM | POA: Diagnosis not present

## 2016-04-25 DIAGNOSIS — Z8719 Personal history of other diseases of the digestive system: Secondary | ICD-10-CM

## 2016-04-25 DIAGNOSIS — Z87442 Personal history of urinary calculi: Secondary | ICD-10-CM | POA: Insufficient documentation

## 2016-04-25 DIAGNOSIS — Z9889 Other specified postprocedural states: Secondary | ICD-10-CM | POA: Diagnosis not present

## 2016-04-25 DIAGNOSIS — Z85820 Personal history of malignant melanoma of skin: Secondary | ICD-10-CM | POA: Insufficient documentation

## 2016-04-25 DIAGNOSIS — I2510 Atherosclerotic heart disease of native coronary artery without angina pectoris: Secondary | ICD-10-CM | POA: Diagnosis not present

## 2016-04-25 DIAGNOSIS — Z7901 Long term (current) use of anticoagulants: Secondary | ICD-10-CM | POA: Insufficient documentation

## 2016-04-25 DIAGNOSIS — I5022 Chronic systolic (congestive) heart failure: Secondary | ICD-10-CM | POA: Insufficient documentation

## 2016-04-25 DIAGNOSIS — E039 Hypothyroidism, unspecified: Secondary | ICD-10-CM | POA: Diagnosis not present

## 2016-04-25 NOTE — Progress Notes (Signed)
VAD CLINIC PROGRESS NOTE  PCP: Dr. Kandice Robinsons ([place]) GU: Dr Risa Grill  Neuro Surgeon: Dr Vertell Limber Vascular: Dr Kellie Simmering Primary HF: Dr Haroldine Laws  Walter Barron is a 75 yo male with h/o VT, PAD and severe systolic HF (EF 0000000) due to mixed ischemic/nonischemic CM he is s/p HM II LVAD implant for destination therapy on October 19, 2013. On 09/01/14 underwent cystoscopy and flexible ureteroscopy with lithotripsy and basket extraction.   VAD implantation was complicated by VT, syncope, A fib/Aflutter and persistent low output felt to be due to RHF. Required short term milrinone.   GU Event 09/2015 Hematuria and chronic nephrolithiasis. Renal US showed mild hyronephrosis and bilateral renal cysts. Dr. Risa Grill took back for repeat lithotripsy and stone extraction by ureteroscopy and J stent placement.  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  01/2016 R CEA. He was started on Plavix.  01/2016 Lumbar fusion L3-L5 with pedicle screws posterolateral arthrodesis with BMP, allograft  Follow up LVAD Clinic: Presents for LVAD follow up and back wound assessment. Overall feeling ok. Denies SOB/PND/Orthopnea. He has not had chills or fever.  Weight at home 202 pounds. Has not had lasix. Good appetite. Taking all medications. No  palpitations. No bleeding, dark stools, neuro symptoms or dark urine. Has not had lasix. Ambulating without a walker.   LVAD interrogation reveals  Speed: 9000 Flow: 4.2 Power: 4.9w PI: 7.3  Alarms: None Events: 10 - 20 events daily (baseline) Fixed speed: 9000 Low speed limit: 8400 Primary Controller: Replace back up battery in 27 months  Back up controller: Replace back up battery in 28 months   LVAD interrogated and functioning as expected. NEGATIVE for significant PI Events or speed drops.    Past Medical History  Diagnosis Date  . Bradycardia   . Ventricular  tachycardia ([place])     a. s/p AICD;   h/o ICD shock;  Amiodarone Rx. S/P ablation in 2012  . PVD (peripheral vascular disease) ([place])     a. 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 HTN (hypertension)   . HLD (hyperlipidemia)   . Cytopenia   . Hypothyroidism   . CKD (chronic kidney disease)   . Chronic systolic heart failure ([place])     a. s/p LVAD 10/2013 for destination therapy  . Ischemic cardiomyopathy   . CVD (cerebrovascular disease)     a. s/p L CEA 2008  . Stroke [facility])     a. 10/2015 admission   . Inappropriate therapy from implantable cardioverter-defibrillator     a. ATP for sinus tach SVT wavelet identified SVT but was no  passive (2014)  . Automatic implantable cardioverter-defibrillator in situ     a. s/p Medtronic Evera device serial [id number]    . 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 L Ear   . PAF (paroxysmal atrial fibrillation) ([place])   . CAD (coronary artery disease)     a. s/p MI 1982;  s/p DES to CFX 2011;  s/p NSTEMI 4/12 in setting of VTach;  cath 7/12:  LM ok, LAD mid 30-40%, Dx's with 40%, mCFX stent patent, prox to mid RCA occluded with R to R and L to R collats.  Medical Tx was continued  .  History of GI bleed     a. on ASA- started on plavix during 10/2015 admission for CVA  . Myocardial infarction ([place])     1992  . Dysrhythmia     AFIB  . Sleep apnea     NO CPAP NOW  HE SENT IT BACK YRS AGO  . Shortness of breath dyspnea     W/ EXERTION   . Arthritis     Current Outpatient Prescriptions  Medication Sig Dispense Refill  . amiodarone (PACERONE) 200 MG tablet Take 0.5 tablets (100 mg total) by mouth daily. 30 tablet 6  . amLODipine (NORVASC) 10 MG tablet Take 1 tablet (10 mg total) by mouth at bedtime. 30 tablet 6  . atorvastatin (LIPITOR) 80 MG tablet Take 80 mg by mouth daily.     . clopidogrel (PLAVIX) 75 MG tablet Take 1 tablet (75 mg total) by mouth daily. 90  tablet 3  . docusate sodium (COLACE) 100 MG capsule Take 100 mg by mouth daily.     . furosemide (LASIX) 20 MG tablet Take 1 tablet (20 mg total) by mouth as needed for edema (weight gain). 30 tablet 3  . levothyroxine (SYNTHROID, LEVOTHROID) 200 MCG tablet Take one 200 mcg daily before breakfast every day with 1 1/2 (300 mcg) every Monday. (Patient taking differently: Take 200-300 mcg by mouth daily before breakfast. Take 1 tablet (288mcg) all days except on Mondays take 1&1/2 tablets (316mcg)) 45 tablet 6  . metoprolol succinate (TOPROL-XL) 25 MG 24 hr tablet Take 1 tablet (25 mg total) by mouth 2 (two) times daily. 60 tablet 3  . Multiple Vitamins-Minerals (ICAPS PO) Take 1 tablet by mouth 2 (two) times daily.    . pantoprazole (PROTONIX) 40 MG tablet TAKE 1 TABLET EVERY 12 HOURS. 60 tablet 3  . ranolazine (RANEXA) 500 MG 12 hr tablet TAKE 1 TABLET (500 MG TOTAL) BY MOUTH 2 (TWO) TIMES DAILY. 60 tablet 6  . warfarin (COUMADIN) 2 MG tablet Take 2-4 mg by mouth daily. Take 1 tablet (2mg ) all days except on Monday take 2 tablets (4mg )    . zinc gluconate 50 MG tablet Take 50 mg by mouth 2 (two) times daily.      No current facility-administered medications for this encounter.    Demerol  REVIEW OF SYSTEMS: All systems negative except as listed in HPI, PMH and Problem list.   Vital signs: HR: 60 Doppler MAP: 90 Auto cuff: 98/48 (75) O2 Sat: 99% Wt: 202 lbs  Last wt: 203.6 lbs Ht: 6'0"  Physical Exam: GENERAL: NAD. Ambulated in the clinic without difficulty  HEENT: normal NECK: Supple, JVP 6-7  no carotid bruits. R CEA scar well-healed No lymphadenopathy or thyromegaly appreciated.   CARDIAC:  Mechanical heart sounds with LVAD hum present.  LUNGS:  clear.  ABDOMEN:  Soft, round, nontender, positive bowel sounds x4.   LVAD exit site: well-healed and incorporated.  Dressing dry and intact.  No erythema or drainage.  Stabilization device present and accurately applied.  Driveline  dressing is being changed weekly per sterile technique. EXTREMITIES:  Warm and dry, no cyanosis, clubbing, R and LLE trace-1+ edema  NEUROLOGIC:  Alert and oriented x 4.  Gait steady.  No aphasia.  No dysarthria.  Affect pleasant. Grossly normal bilateral upper and lower extremity strength.  Wound: lumbar wound full thickness  3.5 x2.5 cm. 1 cm small tunnel at the 5:00 position. Periwound intact. No odor.   ASSESSMENT AND PLAN:  1. Chronic systolic HF: s/p  LVAD 10/2014.  Remains NYHA II.   - Volume status mildly elevated. Today I have asked him to take 20 mg lasix. Check BMET next week.   2.  Atrial arrhythmias: Atrial fibrillation, atrial tachycardia.  S/p DC-CV 06/04/14, DC-CV 03/02/15 and 01/19/16.  - Regular rhythm. Continue amio to 100 mg daily.   3. HTN: MAP stable.  4. LVAD: LVAD parameters reviewed personally, stable.  5. CKD, stage III:  Creatinine stable today. Reminded to take lasix only PRN 6. Anticoagulation management: Goal INR 2-2.5. He is off ASA with GI bleeding. Restart  plavix. Has INR machine . See anticoag note.    7. Nephrolithiasis s/p Renal Stone lithotripsy: Per Dr Risa Grill. Stable. 8. 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 Continue coumadin and restart plavix will check CBC today and next week. No bleeding problems.    9. Lumbar compression fracture: s/p L3-L5 fusion 02/03/16. He has full thickness wound from hematoma.  4x3 cm. Increased granulation noted. Over 50% of the wound has filled in. One small area at 5:00 position with 1 cm depth. Applied 250 mg of Acell powderas well as Acell sheet over Hall . Covered with dry gauze/mepitel and surgilube.  Plan to reassess next week. Will need  ACell next week.  10. 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  Continue atorvastatin. (also warfarin INR goal 2-2.5). S/p R CEA 01/13/16. Continue  plavix.   Follow up next week to reassess wound and reapply A cell. Labs stable today.    Evanee Lubrano  NP-C 04/25/2016

## 2016-04-25 NOTE — Progress Notes (Signed)
Presents today with [name] (significant other).   Symptom  Yes  No  Details   Angina       x Activity:   Claudication        x How far:   Syncope        x When:     Stroke        x       TIA  2001, TIA 10/2015  Orthopnea         x How many pillows: 1  PND         x How often:  CPAP       N/A How many hrs:   Pedal edema        x        R>L baseline improved from last week  Abd fullness               x    N&V               x   Diaphoresis               x   Bleeding              x   Urine color        yellow  SOB        x      Activity: incline, no more than normal  Palpitations        x When:   ICD shock        x   Hospitlizaitons               x When/where/why:   ED visit        x When/where/why:  Other MD              x   Activity        Recently visited [place]. Had some back pain walking around - did not use walking aid.    Fluid    No limitations   Diet    Low salt food   Vital signs: HR: 60 Doppler MAP: 90 Auto cuff: 98/48 (75) O2 Sat: 99% Wt: 202 lbs  Last wt: 203.6 lbs Ht: 6'0"  LVAD interrogation reveals (Reviewed with Dr Haroldine Laws):  Speed: 9000 Flow: 4.2 Power: 4.9w PI: 7.3   Alarms: None Events: 10 - 20 events daily (baseline)  Fixed speed:  9000 Low speed limit:  8400  Primary Controller: Replace back up battery in 27 months   Back up controller: Replace back up battery in 28 months   LVAD interrogated and functioning as expected. Interrogation Negative for elevated flows or powers; no significant change in number of PI events or speed drops.   Driveline Site: Drive line site fully healed and incorporated. Velour is fully implanted at the site. [name] is performing weekly dressing changes using Sorbaview dressings with Biopatch on exit site. Last changed on Sunday. Noticed some yellow flaky and red areas at the site through dressing in clinic today. Dressing changed to reveal some pink/red areas surrounding exit site and scattered amongst boarder of  dressing. Changed to a gauze dressing with Aquacel Ag today. Advised to leave in place until next dressing change Sunday and can determine whether to revert back to weekly dressing kits. Could be beginning of fungal superficial skin rash from summer weather. Reassess next week as D/W Amy Clegg.     Lumbar fusion incision dressing removed today per  Amy Clegg. Healing very well. Deepest part of wound bed is still ~1cm pictured at the 11 o'clock position below however the majority of the wound bed is <0.5cm deep. Wound measures 2.5 x 3cm. Reapplied Acell powder and mepitel with gel application, gauze and tape.     Encounter Details: Presents to clinic with his SO [name] for VAD FU and wound care for lumbar incision. R leg swelling improved since last visit. Weight down 1.5 lbs. PI events improved back to baseline for him and no further LOW FLOW alarms on interrogation reviewing data through 04/19/16. Pt denies palpitations, reports occasional orthostatic dizziness, denies syncopal events. Currently on Amiodarone 100 mg daily (weaning down to D/C soon).     MAP 74 on current regimen. Doppler reflecting modified SBP.   LABS:  INR performed at home today 2.1 --> Goal 2 - 2.5, no ASA, 75 mg Plavix. See anticoag note.   Med Changes: none  RTC in 1 week dressing change/Acell application.   Janene Madeira, RN VAD Coordinator   Office: [phone] 24/7 VAD Pager: [phone]

## 2016-05-02 ENCOUNTER — Ambulatory Visit (HOSPITAL_COMMUNITY)
Admission: RE | Admit: 2016-05-02 | Discharge: 2016-05-02 | Disposition: A | Discharge status: Home / Self Care | Payer: Medicare Other | Source: Ambulatory Visit | Attending: Internal Medicine | Admitting: Internal Medicine

## 2016-05-02 ENCOUNTER — Encounter (HOSPITAL_COMMUNITY): Payer: Self-pay

## 2016-05-02 VITALS — BP 90/71 | HR 60 | Ht 72.0 in | Wt 204.4 lb

## 2016-05-02 DIAGNOSIS — I471 Supraventricular tachycardia: Secondary | ICD-10-CM

## 2016-05-02 DIAGNOSIS — E039 Hypothyroidism, unspecified: Secondary | ICD-10-CM | POA: Diagnosis not present

## 2016-05-02 DIAGNOSIS — N183 Chronic kidney disease, stage 3 unspecified: Secondary | ICD-10-CM

## 2016-05-02 DIAGNOSIS — Z95811 Presence of heart assist device: Secondary | ICD-10-CM

## 2016-05-02 DIAGNOSIS — I4719 Other supraventricular tachycardia: Secondary | ICD-10-CM

## 2016-05-02 DIAGNOSIS — I5022 Chronic systolic (congestive) heart failure: Secondary | ICD-10-CM

## 2016-05-02 DIAGNOSIS — Z79899 Other long term (current) drug therapy: Secondary | ICD-10-CM | POA: Diagnosis not present

## 2016-05-02 DIAGNOSIS — S31000D Unspecified open wound of lower back and pelvis without penetration into retroperitoneum, subsequent encounter: Secondary | ICD-10-CM

## 2016-05-02 LAB — TSH: TSH: 0.924 u[IU]/mL (ref 0.350–4.500)

## 2016-05-02 LAB — T4, FREE: Free T4: 1.53 ng/dL — ABNORMAL HIGH (ref 0.61–1.12)

## 2016-05-02 NOTE — Patient Instructions (Addendum)
1. CONTINUE Amiodarone  2. Take an extra 1 mg (1/2 pill) of warfarin tonight.   3. Return to clinic in 1 week for wound care.   4. Take a lasix today OR tomorrow morning.

## 2016-05-02 NOTE — Progress Notes (Signed)
Presents today with [name] (significant other).   Symptom  Yes  No  Details   Angina       x Activity:   Claudication        x How far:   Syncope        x When:     Stroke        x       TIA  2001, TIA 10/2015  Orthopnea         x How many pillows: 1  PND         x How often:  CPAP       N/A How many hrs:   Pedal edema        x        R>L baseline improved from last week  Abd fullness               x    N&V               x   Diaphoresis               x   Bleeding              x   Urine color        yellow  SOB        x      Activity: incline, no more than normal  Palpitations        x When:   ICD shock        x   Hospitlizaitons               x When/where/why:   ED visit        x When/where/why:  Other MD              x   Activity        Walking without aid. Pain improved.   Fluid    No limitations   Diet    Low salt food   Vital signs: HR: 61 Doppler MAP: 84 Auto cuff: 90/71 (78) O2 Sat: 95% Wt: 204.4 lbs  Last wt: 202 lbs Ht: 6'0"  LVAD interrogation reveals (Reviewed with Dr Haroldine Laws):  Speed: 9000 Flow: 4.2 Power: 4.9w PI: 4.9 - 7.1  Alarms: None Events: < 10 events daily (improved from baseline 10 - 20 daily)  Fixed speed:  9000 Low speed limit:  8400  Primary Controller: Replace back up battery in 27 months   Back up controller: Replace back up battery in 28 months   LVAD interrogated and functioning as expected. Interrogation Negative for elevated flows or powers; no significant change in number of PI events or speed drops.   Driveline Site: Drive line site fully healed and incorporated. Velour is fully implanted at the site. [name] is performing weekly dressing changes using Sorbaview dressings with Biopatch on exit site. Last changed on Sunday. Area from last week improved and appears pale and scaley. Resumed weekly dressings.   Lumbar fusion incision dressing removed today per Amy Clegg. Healing very well. Deepest part of wound bed the majority of the  wound bed is <0.5cm deep. Wound measures 2.5 x 1.5 cm today. Reapplied Acell powder/past and mepitel with gel application, gauze and tape.     Encounter Details: Presents to clinic with his SO [name] for VAD FU and wound care for lumbar incision. Some yellow/tan drainage on outer aspect of dressing from [place] application. No complaints today in  clinic. Fluid status elevated today per Dr. Clayborne Dana assessment. Up 2 lbs since last week after eating macaroni and cheese recently.   MAP 78 - 84 on current regimen. Doppler reflecting MAP.   LABS: INR performed at home today 1.9 --> Goal 2 - 2.5, no ASA, 75 mg Plavix. See anticoag note. Ate some turnip greens Monday.   Med Changes: Continue Amiodarone with h/o recent recurrent AFib w/ RVR. Will check TSH/T4/T3 today per Dr. Haroldine Laws. Take dose of lasix today.   RTC in 1 week dressing change/Acell application.   Janene Madeira, RN VAD Coordinator   Office: [phone] 24/7 VAD Pager: [phone]

## 2016-05-03 LAB — T3, FREE: T3, Free: 2.1 pg/mL (ref 2.0–4.4)

## 2016-05-07 ENCOUNTER — Other Ambulatory Visit (HOSPITAL_COMMUNITY): Payer: Self-pay | Admitting: *Deleted

## 2016-05-07 ENCOUNTER — Other Ambulatory Visit: Payer: Self-pay | Admitting: Internal Medicine

## 2016-05-07 DIAGNOSIS — K922 Gastrointestinal hemorrhage, unspecified: Secondary | ICD-10-CM

## 2016-05-07 DIAGNOSIS — I5041 Acute combined systolic (congestive) and diastolic (congestive) heart failure: Secondary | ICD-10-CM

## 2016-05-07 DIAGNOSIS — Z95811 Presence of heart assist device: Secondary | ICD-10-CM

## 2016-05-07 MED ORDER — PANTOPRAZOLE SODIUM 40 MG PO TBEC: 40.0000 mg | DELAYED_RELEASE_TABLET | Freq: Two times a day (BID) | ORAL | Status: DC

## 2016-05-08 NOTE — Progress Notes (Signed)
Patient ID: [name], male   DOB: 1941-09-16, 75 y.o.   MRN: [id number]   VAD CLINIC PROGRESS NOTE  PCP: Dr. Kandice Robinsons ([place]) GU: Dr Risa Grill  Neuro Surgeon: Dr Vertell Limber Vascular: Dr Kellie Simmering Primary HF: Dr Haroldine Laws  Draelyn Service is a 75 yo male with h/o VT, PAD and severe systolic HF (EF 0000000) due to mixed ischemic/nonischemic CM he is s/p HM II LVAD implant for destination therapy on October 19, 2013. On 09/01/14 underwent cystoscopy and flexible ureteroscopy with lithotripsy and basket extraction.   VAD implantation was complicated by VT, syncope, A fib/Aflutter and persistent low output felt to be due to RHF. Required short term milrinone.   GU Event 09/2015 Hematuria and chronic nephrolithiasis. Renal US showed mild hyronephrosis and bilateral renal cysts. Dr. Risa Grill took back for repeat lithotripsy and stone extraction by ureteroscopy and J stent placement.  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  01/2016 R CEA. He was started on Plavix.  01/2016 Lumbar fusion L3-L5 with pedicle screws posterolateral arthrodesis with BMP, allograft  Follow up LVAD Clinic: Presents for LVAD follow up.. Overall feeling better. Back pain is improved and wound is healing.  No fevers, chills or drainage. Has had some mile edema.Denies SOB/PND/Orthopnea.  Weight at home is relatively stable. Takes lasix as needed. Good appetite. Taking all medications. No  palpitations. No bleeding, dark stools, neuro symptoms or dark urine.    LVAD interrogation reveals (Reviewed with Dr Haroldine Laws):  Speed: 9000 Flow: 4.2 Power: 4.9w PI: 4.9 - 7.1  Alarms: None Events: < 10 events daily (improved from baseline 10 - 20 daily)  Fixed speed: 9000 Low speed limit: 8400  Primary Controller: Replace back up battery in 27 months  Back up controller: Replace back up battery in 28 months    Past Medical  History  Diagnosis Date  . Bradycardia   . Ventricular tachycardia ([place])     a. s/p AICD;   h/o ICD shock;  Amiodarone Rx. S/P ablation in 2012  . PVD (peripheral vascular disease) ([place])     a. 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 HTN (hypertension)   . HLD (hyperlipidemia)   . Cytopenia   . Hypothyroidism   . CKD (chronic kidney disease)   . Chronic systolic heart failure ([place])     a. s/p LVAD 10/2013 for destination therapy  . Ischemic cardiomyopathy   . CVD (cerebrovascular disease)     a. s/p L CEA 2008  . Stroke [facility])     a. 10/2015 admission   . Inappropriate therapy from implantable cardioverter-defibrillator     a. ATP for sinus tach SVT wavelet identified SVT but was no  passive (2014)  . Automatic implantable cardioverter-defibrillator in situ     a. s/p Medtronic Evera device serial [id number]    . 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 L Ear   . PAF (paroxysmal atrial fibrillation) ([place])   . CAD (coronary artery disease)     a. s/p MI 1982;  s/p DES to CFX 2011;  s/p NSTEMI 4/12 in setting of VTach;  cath 7/12:  LM ok, LAD mid 30-40%, Dx's with 40%, mCFX stent patent, prox to mid RCA occluded  with R to R and L to R collats.  Medical Tx was continued  . History of GI bleed     a. on ASA- started on plavix during 10/2015 admission for CVA  . Myocardial infarction ([place])     1992  . Dysrhythmia     AFIB  . Sleep apnea     NO CPAP NOW  HE SENT IT BACK YRS AGO  . Shortness of breath dyspnea     W/ EXERTION   . Arthritis     Current Outpatient Prescriptions  Medication Sig Dispense Refill  . amiodarone (PACERONE) 200 MG tablet Take 0.5 tablets (100 mg total) by mouth daily. 30 tablet 6  . amLODipine (NORVASC) 10 MG tablet Take 1 tablet (10 mg total) by mouth at bedtime. 30 tablet 6  . atorvastatin (LIPITOR) 80 MG tablet Take 80 mg by mouth daily.     . clopidogrel (PLAVIX) 75 MG  tablet Take 1 tablet (75 mg total) by mouth daily. 90 tablet 3  . docusate sodium (COLACE) 100 MG capsule Take 100 mg by mouth daily.     . furosemide (LASIX) 20 MG tablet Take 1 tablet (20 mg total) by mouth as needed for edema (weight gain). 30 tablet 3  . levothyroxine (SYNTHROID, LEVOTHROID) 200 MCG tablet Take one 200 mcg daily before breakfast every day with 1 1/2 (300 mcg) every Monday. (Patient taking differently: Take 200-300 mcg by mouth daily before breakfast. Take 1 tablet (273mcg) all days except on Mondays take 1&1/2 tablets (358mcg)) 45 tablet 6  . metoprolol succinate (TOPROL-XL) 25 MG 24 hr tablet Take 1 tablet (25 mg total) by mouth 2 (two) times daily. 60 tablet 3  . Multiple Vitamins-Minerals (ICAPS PO) Take 1 tablet by mouth 2 (two) times daily.    . pantoprazole (PROTONIX) 40 MG tablet Take 1 tablet (40 mg total) by mouth 2 (two) times daily. 60 tablet 6  . ranolazine (RANEXA) 500 MG 12 hr tablet TAKE 1 TABLET (500 MG TOTAL) BY MOUTH 2 (TWO) TIMES DAILY. 60 tablet 6  . warfarin (COUMADIN) 2 MG tablet Take 2-4 mg by mouth daily. Take 1 tablet (2mg ) all days except on Monday take 2 tablets (4mg )    . zinc gluconate 50 MG tablet Take 50 mg by mouth 2 (two) times daily.      No current facility-administered medications for this encounter.    Demerol  REVIEW OF SYSTEMS: All systems negative except as listed in HPI, PMH and Problem list.   Vital signs: HR: 61 Doppler MAP: 84 Auto cuff: 90/71 (78) O2 Sat: 95% Wt: 204.4 lbs  Last wt: 202 lbs Ht: 6'0"  Physical Exam: GENERAL: NAD. Ambulated in the clinic without difficulty  HEENT: normal NECK: Supple, JVP 8-9  no carotid bruits. R CEA scar well-healed No lymphadenopathy or thyromegaly appreciated.   CARDIAC:  Mechanical heart sounds with LVAD hum present.  LUNGS:  clear.  ABDOMEN:  Soft, round, nontender, positive bowel sounds x4.   LVAD exit site: well-healed and incorporated.  Dressing dry and intact.  No erythema  or drainage.  Stabilization device present and accurately applied.  Driveline dressing is being changed weekly per sterile technique. EXTREMITIES:  Warm and dry, no cyanosis, clubbing, R and LLE 1+ edema  NEUROLOGIC:  Alert and oriented x 4.  Gait steady.  No aphasia.  No dysarthria.  Affect pleasant. Grossly normal bilateral upper and lower extremity strength.  Wound: lumbar wound. Granulating.[name] Periwound intact. No odor or drainage.[name]  ASSESSMENT AND PLAN:  1. Chronic systolic HF: s/p LVAD 123XX123.  Remains NYHA II.   - Volume status mildly elevated. Continue to take lasix as needed Billey Gosling take one today Check BMET next week.   2.  Atrial arrhythmias: Atrial fibrillation, atrial tachycardia.  S/p DC-CV 06/04/14, DC-CV 03/02/15 and 01/19/16.  - Regular rhythm. Continue amio to 100 mg daily.   3. HTN: MAP stable.  4. LVAD: LVAD parameters reviewed personally, He is having some low flows. No clear reason. ECG obtained today and still in NSR with PACs. (No AF), Volume status ok. Consider ramp echo if persists. 5. CKD, stage III:  Creatinine stable today. Take lasix as needed 6. Anticoagulation management: Goal INR 2-2.5. He is off ASA with GI bleeding. Continue Plavix. Uses home monitor. 7. Nephrolithiasis s/p Renal Stone lithotripsy: Per Dr Risa Grill. Stable. 8. 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 Continue coumadin and plavix.(with h/o CVA) check CBC. . No bleeding problems.    9. Lumbar compression fracture: s/p L3-L5 fusion 02/03/16. He has full thickness wound from hematoma.  Healing well. Followed closely by Darrick Grinder, NP  10. 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  Continue atorvastatin. (also warfarin INR goal 2-2.5). S/p R CEA 01/13/16. Continue  plavix.  11. H/o amio-induced hypethyroidism: Followed by Dr. Forde Dandy. Amio restarted peri-operatively at low dose due to recurrent atrial arrhythmias. Will check thyroid panel today.   Glori Bickers  MD 05/08/2016

## 2016-05-09 ENCOUNTER — Ambulatory Visit (HOSPITAL_COMMUNITY): Payer: Self-pay | Admitting: Infectious Diseases

## 2016-05-10 ENCOUNTER — Ambulatory Visit (HOSPITAL_COMMUNITY)
Admission: RE | Admit: 2016-05-10 | Discharge: 2016-05-10 | Disposition: A | Discharge status: Home / Self Care | Payer: Medicare Other | Source: Ambulatory Visit | Attending: Internal Medicine | Admitting: Internal Medicine

## 2016-05-10 VITALS — BP 70/0 | HR 64 | Wt 204.2 lb

## 2016-05-10 DIAGNOSIS — Z79899 Other long term (current) drug therapy: Secondary | ICD-10-CM | POA: Insufficient documentation

## 2016-05-10 DIAGNOSIS — I5022 Chronic systolic (congestive) heart failure: Secondary | ICD-10-CM

## 2016-05-10 DIAGNOSIS — I5032 Chronic diastolic (congestive) heart failure: Secondary | ICD-10-CM

## 2016-05-10 DIAGNOSIS — E039 Hypothyroidism, unspecified: Secondary | ICD-10-CM | POA: Insufficient documentation

## 2016-05-10 DIAGNOSIS — I480 Paroxysmal atrial fibrillation: Secondary | ICD-10-CM | POA: Diagnosis not present

## 2016-05-10 DIAGNOSIS — S31000D Unspecified open wound of lower back and pelvis without penetration into retroperitoneum, subsequent encounter: Secondary | ICD-10-CM | POA: Diagnosis not present

## 2016-05-10 DIAGNOSIS — Z7901 Long term (current) use of anticoagulants: Secondary | ICD-10-CM | POA: Diagnosis not present

## 2016-05-10 DIAGNOSIS — T829XXD Unspecified complication of cardiac and vascular prosthetic device, implant and graft, subsequent encounter: Secondary | ICD-10-CM

## 2016-05-10 NOTE — Progress Notes (Signed)
Pt presents for wound care per Darrick Grinder, NP.  Wife reports pt took shower this past Tuesday and dressing got wet.  More drainage noted.      Vital Signs: HR:  64 Doppler BP:  70 Automatic BP:  88/57 (68) SPO2:  96%  Weight:  204.3 lbs   VAD interrogation:  Speed:  8990 Flow:  3.8 Power:  4.6 PI:  6.9  Alarms:  none Events:   15 - 20 PI events  Fixed speed:  9000 Low speed limit:  8400

## 2016-05-10 NOTE — Progress Notes (Signed)
Patient ID: [name], male   DOB: 1941/10/26, 75 y.o.   MRN: [id number]   VAD CLINIC PROGRESS NOTE  PCP: Dr. Kandice Robinsons ([place]) GU: Dr Risa Grill  Neuro Surgeon: Dr Vertell Limber Vascular: Dr Kellie Simmering Primary HF: Dr Haroldine Laws  [name] is a 75 yo male with h/o VT, PAD and severe systolic HF (EF 0000000) due to mixed ischemic/nonischemic CM he is s/p HM II LVAD implant for destination therapy on October 19, 2013. On 09/01/14 underwent cystoscopy and flexible ureteroscopy with lithotripsy and basket extraction.   VAD implantation was complicated by VT, syncope, A fib/Aflutter and persistent low output felt to be due to RHF. Required short term milrinone.   GU Event 09/2015 Hematuria and chronic nephrolithiasis. Renal US showed mild hyronephrosis and bilateral renal cysts. Dr. Risa Grill took back for repeat lithotripsy and stone extraction by ureteroscopy and J stent placement.  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  01/2016 R CEA. He was started on Plavix.  01/2016 Lumbar fusion L3-L5 with pedicle screws posterolateral arthrodesis with BMP, allograft  Follow up LVAD Clinic: Presents for LVAD follow up. Overall feeling better. Back pain is improved and wound is healing. Not moving around much at home.  No fevers, chills or drainage. Denies SOB/PND/Orthopnea.  Weight at home is relatively stable. Takes lasix as needed. Good appetite. Taking all medications. No  palpitations. No bleeding, dark stools, neuro symptoms or dark urine.    LVAD interrogation reveals (Reviewed with Dr Haroldine Laws):   Speed: 8990 Flow: 3.8 Power: 4.6 PI: 6.9 Alarms: none Events: 15 - 20 PI events  Fixed speed: 9000 Low speed limit: 8400  Primary Controller: Replace back up battery in 27 months  Back up controller: Replace back up battery in 28 months    Past Medical History  Diagnosis Date  .  Bradycardia   . Ventricular tachycardia ([place])     a. s/p AICD;   h/o ICD shock;  Amiodarone Rx. S/P ablation in 2012  . PVD (peripheral vascular disease) ([place])     a. 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 HTN (hypertension)   . HLD (hyperlipidemia)   . Cytopenia   . Hypothyroidism   . CKD (chronic kidney disease)   . Chronic systolic heart failure ([place])     a. s/p LVAD 10/2013 for destination therapy  . Ischemic cardiomyopathy   . CVD (cerebrovascular disease)     a. s/p L CEA 2008  . Stroke [facility])     a. 10/2015 admission   . Inappropriate therapy from implantable cardioverter-defibrillator     a. ATP for sinus tach SVT wavelet identified SVT but was no  passive (2014)  . Automatic implantable cardioverter-defibrillator in situ     a. s/p Medtronic Evera device serial [id number]    . 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 L Ear   . PAF (paroxysmal atrial fibrillation) ([place])   . CAD (coronary artery disease)     a. s/p MI 1982;  s/p DES to CFX 2011;  s/p NSTEMI 4/12 in setting of VTach;  cath 7/12:  LM ok, LAD mid 30-40%, Dx's with 40%, mCFX stent patent, prox to mid RCA occluded with R to R and L  to R collats.  Medical Tx was continued  . History of GI bleed     a. on ASA- started on plavix during 10/2015 admission for CVA  . Myocardial infarction ([place])     1992  . Dysrhythmia     AFIB  . Sleep apnea     NO CPAP NOW  HE SENT IT BACK YRS AGO  . Shortness of breath dyspnea     W/ EXERTION   . Arthritis     Current Outpatient Prescriptions  Medication Sig Dispense Refill  . amiodarone (PACERONE) 200 MG tablet Take 0.5 tablets (100 mg total) by mouth daily. 30 tablet 6  . amLODipine (NORVASC) 10 MG tablet Take 1 tablet (10 mg total) by mouth at bedtime. 30 tablet 6  . atorvastatin (LIPITOR) 80 MG tablet Take 80 mg by mouth daily.     . clopidogrel (PLAVIX) 75 MG tablet Take 1 tablet (75 mg  total) by mouth daily. 90 tablet 3  . docusate sodium (COLACE) 100 MG capsule Take 100 mg by mouth daily.     . furosemide (LASIX) 20 MG tablet Take 1 tablet (20 mg total) by mouth as needed for edema (weight gain). 30 tablet 3  . levothyroxine (SYNTHROID, LEVOTHROID) 200 MCG tablet Take one 200 mcg daily before breakfast every day with 1 1/2 (300 mcg) every Monday. (Patient taking differently: Take 200-300 mcg by mouth daily before breakfast. Take 1 tablet (241mcg) all days except on Mondays take 1&1/2 tablets (358mcg)) 45 tablet 6  . metoprolol succinate (TOPROL-XL) 25 MG 24 hr tablet Take 1 tablet (25 mg total) by mouth 2 (two) times daily. 60 tablet 3  . Multiple Vitamins-Minerals (ICAPS PO) Take 1 tablet by mouth 2 (two) times daily.    . pantoprazole (PROTONIX) 40 MG tablet Take 1 tablet (40 mg total) by mouth 2 (two) times daily. 60 tablet 6  . ranolazine (RANEXA) 500 MG 12 hr tablet TAKE 1 TABLET (500 MG TOTAL) BY MOUTH 2 (TWO) TIMES DAILY. 60 tablet 6  . warfarin (COUMADIN) 2 MG tablet Take 2-4 mg by mouth daily. Take 1 tablet (2mg ) all days except on Monday take 2 tablets (4mg )    . zinc gluconate 50 MG tablet Take 50 mg by mouth 2 (two) times daily.      No current facility-administered medications for this encounter.    Demerol  REVIEW OF SYSTEMS: All systems negative except as listed in HPI, PMH and Problem list.   Vital signs: HR: 64 Doppler MAP: 70 Auto cuff: 88/57 (68) O2 Sat: 96% Wt: 204.3  Last wt: 204 lbs Ht: 6'0"  Physical Exam: GENERAL: NAD. Ambulated in the clinic without difficulty  HEENT: normal NECK: Supple, JVP 6-7  no carotid bruits. R CEA scar well-healed No lymphadenopathy or thyromegaly appreciated.   CARDIAC:  Mechanical heart sounds with LVAD hum present.  LUNGS:  clear.  ABDOMEN:  Soft, round, nontender, positive bowel sounds x4.   LVAD exit site: well-healed and incorporated.  Dressing dry and intact.  No erythema or drainage.  Stabilization  device present and accurately applied.  Driveline dressing is being changed weekly per sterile technique. EXTREMITIES:  Warm and dry, no cyanosis, clubbing, R and LLE trace  1+ edema  NEUROLOGIC:  Alert and oriented x 4.  Gait steady.  No aphasia.  No dysarthria.  Affect pleasant. Grossly normal bilateral upper and lower extremity strength.  Wound: lumbar wound. Increase granulation. Max depth .5 cm. Periwound intact with scar. Min-mod exudate  after 1 week.  No odor or drainage..   ASSESSMENT AND PLAN:  1. Chronic systolic HF: s/p LVAD 123XX123.  Remains NYHA II.   - Volume status stable.  Continue to take lasix as needed.    2.  Atrial arrhythmias: Atrial fibrillation, atrial tachycardia.  S/p DC-CV 06/04/14, DC-CV 03/02/15 and 01/19/16.  - Regular rhythm. Continue amio to 100 mg daily.   3. HTN: MAP stable.  4. LVAD: LVAD parameters reviewed personally, parameters ok. Volume status ok.  5. CKD, stage III:  Recent creatinine ok.  6. Anticoagulation management: Goal INR 2-2.5. He is off ASA with GI bleeding. Continue Plavix. Uses home monitor. 7. Nephrolithiasis s/p Renal Stone lithotripsy: Per Dr Risa Grill. Stable. 8. 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 Continue coumadin and plavix.(with h/o CVA). Recent CBC ok.  No bleeding problems.    9. Lumbar compression fracture: s/p L3-L5 fusion 02/03/16. He has full thickness wound from hematoma.   Wound with increased granulation. Acell reapplied. Will repeat next week.  10. 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  Continue atorvastatin. (also warfarin INR goal 2-2.5). S/p R CEA 01/13/16. Continue  plavix.  11. H/o amio-induced hypethyroidism: Followed by Dr. Forde Dandy. Amio restarted peri-operatively at low dose due to recurrent atrial arrhythmias. Recent TSH ok on 05/02/16   Follow up next week. Anticipate switching to aquacell dressing.   Amy Clegg NP-C   05/10/2016

## 2016-05-11 ENCOUNTER — Encounter (HOSPITAL_COMMUNITY): Payer: Self-pay | Admitting: Infectious Diseases

## 2016-05-14 ENCOUNTER — Other Ambulatory Visit (HOSPITAL_COMMUNITY): Payer: Self-pay | Admitting: *Deleted

## 2016-05-14 DIAGNOSIS — I4819 Other persistent atrial fibrillation: Secondary | ICD-10-CM

## 2016-05-14 MED ORDER — METOPROLOL SUCCINATE ER 25 MG PO TB24: 25.0000 mg | ORAL_TABLET | Freq: Two times a day (BID) | ORAL | Status: DC

## 2016-05-17 ENCOUNTER — Ambulatory Visit (HOSPITAL_COMMUNITY): Payer: Self-pay | Admitting: Unknown Physician Specialty

## 2016-05-17 ENCOUNTER — Ambulatory Visit (HOSPITAL_COMMUNITY)
Admission: RE | Admit: 2016-05-17 | Discharge: 2016-05-17 | Disposition: A | Discharge status: Home / Self Care | Payer: Medicare Other | Source: Ambulatory Visit | Attending: Cardiology | Admitting: Cardiology

## 2016-05-17 DIAGNOSIS — I5023 Acute on chronic systolic (congestive) heart failure: Secondary | ICD-10-CM | POA: Diagnosis not present

## 2016-05-17 DIAGNOSIS — S31000D Unspecified open wound of lower back and pelvis without penetration into retroperitoneum, subsequent encounter: Secondary | ICD-10-CM

## 2016-05-17 DIAGNOSIS — Z95811 Presence of heart assist device: Secondary | ICD-10-CM | POA: Diagnosis not present

## 2016-05-17 DIAGNOSIS — Z4801 Encounter for change or removal of surgical wound dressing: Secondary | ICD-10-CM | POA: Diagnosis present

## 2016-05-17 LAB — POCT INR: INR: 2.1

## 2016-05-17 NOTE — Progress Notes (Signed)
Pt presents for wound care today.      Site was cleaned with chloroprep and rinsed with sterile saline, covered with aquaseal, dry 2 x 2 and dry 4 x 4 per Darrick Grinder, NP. Wife was instructed to change dressing qod.   Vital Signs: HR:  64 Doppler BP:  70 Automatic BP:  88/57 (68) SPO2:  96%  Weight:  204.3 lbs   VAD interrogation:  Speed:  8990 Flow:  3.8 Power:  4.6 PI:  6.9  Alarms:  none Events:   15 - 20 PI events  Fixed speed:  9000 Low speed limit:  8400

## 2016-05-17 NOTE — Patient Instructions (Signed)
1. Change dressing qod-clean with chloroprep and saline. Cover with aquaseal and gauze. 2. Return to clinic in 1 month.

## 2016-05-23 ENCOUNTER — Ambulatory Visit (HOSPITAL_COMMUNITY): Payer: Self-pay | Admitting: *Deleted

## 2016-05-23 LAB — POCT INR: INR: 2.8

## 2016-05-30 ENCOUNTER — Ambulatory Visit: Payer: Self-pay | Admitting: Unknown Physician Specialty

## 2016-05-30 LAB — POCT INR: INR: 2

## 2016-06-06 ENCOUNTER — Ambulatory Visit (HOSPITAL_COMMUNITY): Payer: Self-pay | Admitting: *Deleted

## 2016-06-06 ENCOUNTER — Other Ambulatory Visit (HOSPITAL_COMMUNITY): Payer: Self-pay | Admitting: *Deleted

## 2016-06-06 LAB — POCT INR: INR: 2.3

## 2016-06-13 ENCOUNTER — Ambulatory Visit (HOSPITAL_COMMUNITY): Payer: Self-pay | Admitting: *Deleted

## 2016-06-13 LAB — POCT INR: INR: 2.1

## 2016-06-15 ENCOUNTER — Other Ambulatory Visit (HOSPITAL_COMMUNITY): Payer: Self-pay | Admitting: *Deleted

## 2016-06-15 DIAGNOSIS — Z7901 Long term (current) use of anticoagulants: Secondary | ICD-10-CM

## 2016-06-15 DIAGNOSIS — Z95811 Presence of heart assist device: Secondary | ICD-10-CM

## 2016-06-18 ENCOUNTER — Ambulatory Visit (HOSPITAL_COMMUNITY): Payer: Self-pay | Admitting: *Deleted

## 2016-06-18 ENCOUNTER — Ambulatory Visit (HOSPITAL_COMMUNITY)
Admission: RE | Admit: 2016-06-18 | Discharge: 2016-06-18 | Disposition: A | Discharge status: Home / Self Care | Payer: Medicare Other | Source: Ambulatory Visit | Attending: Cardiology | Admitting: Cardiology

## 2016-06-18 VITALS — BP 80/0 | HR 60 | Ht 72.0 in | Wt 206.8 lb

## 2016-06-18 DIAGNOSIS — Z79899 Other long term (current) drug therapy: Secondary | ICD-10-CM | POA: Insufficient documentation

## 2016-06-18 DIAGNOSIS — I2510 Atherosclerotic heart disease of native coronary artery without angina pectoris: Secondary | ICD-10-CM | POA: Diagnosis not present

## 2016-06-18 DIAGNOSIS — Z8673 Personal history of transient ischemic attack (TIA), and cerebral infarction without residual deficits: Secondary | ICD-10-CM | POA: Diagnosis not present

## 2016-06-18 DIAGNOSIS — Z87442 Personal history of urinary calculi: Secondary | ICD-10-CM | POA: Diagnosis not present

## 2016-06-18 DIAGNOSIS — I471 Supraventricular tachycardia: Secondary | ICD-10-CM | POA: Insufficient documentation

## 2016-06-18 DIAGNOSIS — I739 Peripheral vascular disease, unspecified: Secondary | ICD-10-CM | POA: Insufficient documentation

## 2016-06-18 DIAGNOSIS — Z95811 Presence of heart assist device: Secondary | ICD-10-CM

## 2016-06-18 DIAGNOSIS — I130 Hypertensive heart and chronic kidney disease with heart failure and stage 1 through stage 4 chronic kidney disease, or unspecified chronic kidney disease: Secondary | ICD-10-CM | POA: Insufficient documentation

## 2016-06-18 DIAGNOSIS — Z85820 Personal history of malignant melanoma of skin: Secondary | ICD-10-CM | POA: Diagnosis not present

## 2016-06-18 DIAGNOSIS — G4730 Sleep apnea, unspecified: Secondary | ICD-10-CM | POA: Insufficient documentation

## 2016-06-18 DIAGNOSIS — E039 Hypothyroidism, unspecified: Secondary | ICD-10-CM | POA: Insufficient documentation

## 2016-06-18 DIAGNOSIS — N183 Chronic kidney disease, stage 3 (moderate): Secondary | ICD-10-CM | POA: Diagnosis not present

## 2016-06-18 DIAGNOSIS — Z7901 Long term (current) use of anticoagulants: Secondary | ICD-10-CM

## 2016-06-18 DIAGNOSIS — I5022 Chronic systolic (congestive) heart failure: Secondary | ICD-10-CM

## 2016-06-18 DIAGNOSIS — I252 Old myocardial infarction: Secondary | ICD-10-CM | POA: Diagnosis not present

## 2016-06-18 DIAGNOSIS — I480 Paroxysmal atrial fibrillation: Secondary | ICD-10-CM | POA: Diagnosis not present

## 2016-06-18 DIAGNOSIS — E785 Hyperlipidemia, unspecified: Secondary | ICD-10-CM | POA: Insufficient documentation

## 2016-06-18 DIAGNOSIS — I4891 Unspecified atrial fibrillation: Secondary | ICD-10-CM | POA: Insufficient documentation

## 2016-06-18 DIAGNOSIS — S31000S Unspecified open wound of lower back and pelvis without penetration into retroperitoneum, sequela: Secondary | ICD-10-CM

## 2016-06-18 DIAGNOSIS — Z7902 Long term (current) use of antithrombotics/antiplatelets: Secondary | ICD-10-CM | POA: Diagnosis not present

## 2016-06-18 DIAGNOSIS — M1990 Unspecified osteoarthritis, unspecified site: Secondary | ICD-10-CM | POA: Insufficient documentation

## 2016-06-18 LAB — CBC
HEMATOCRIT: 39.6 % (ref 39.0–52.0)
Hemoglobin: 12.7 g/dL — ABNORMAL LOW (ref 13.0–17.0)
MCH: 32.1 pg (ref 26.0–34.0)
MCHC: 32.1 g/dL (ref 30.0–36.0)
MCV: 100 fL (ref 78.0–100.0)
PLATELETS: 147 10*3/uL — AB (ref 150–400)
RBC: 3.96 MIL/uL — AB (ref 4.22–5.81)
RDW: 14 % (ref 11.5–15.5)
WBC: 6 10*3/uL (ref 4.0–10.5)

## 2016-06-18 LAB — BASIC METABOLIC PANEL
Anion gap: 8 (ref 5–15)
BUN: 32 mg/dL — AB (ref 6–20)
CHLORIDE: 106 mmol/L (ref 101–111)
CO2: 24 mmol/L (ref 22–32)
CREATININE: 1.51 mg/dL — AB (ref 0.61–1.24)
Calcium: 9.5 mg/dL (ref 8.9–10.3)
GFR calc Af Amer: 50 mL/min — ABNORMAL LOW (ref >60)
GFR calc non Af Amer: 43 mL/min — ABNORMAL LOW (ref >60)
Glucose, Bld: 116 mg/dL — ABNORMAL HIGH (ref 65–99)
POTASSIUM: 4.5 mmol/L (ref 3.5–5.1)
SODIUM: 138 mmol/L (ref 135–145)

## 2016-06-18 LAB — PROTIME-INR
INR: 2.06
PROTHROMBIN TIME: 23.6 s — AB (ref 11.4–15.2)

## 2016-06-18 LAB — LACTATE DEHYDROGENASE: LDH: 295 U/L — AB (ref 98–192)

## 2016-06-18 NOTE — Progress Notes (Signed)
Presents today with [name] (significant other).   Symptom  Yes  No  Details   Angina       x Activity:   Claudication        x How far:   Syncope        x When:     Stroke        x       TIA  2001, TIA 10/2015  Orthopnea         x How many pillows: 1  PND         x How often:  CPAP       N/A How many hrs:   Pedal edema               x   Abd fullness               x    N&V               x   Diaphoresis               x   Bleeding              x   Urine color        yellow  SOB        x      Activity: incline, no more than normal  Palpitations        x When:   ICD shock        x   Hospitlizaitons               x When/where/why:   ED visit        x When/where/why:  Other MD              x   Activity        Walking without aid. Pain improved.   Fluid    < 2 liters over last few days  Diet    Low salt food   Vital signs: HR: 60 Doppler MAP: 80 Auto cuff: 104/65 (79) O2 Sat: 99% Wt: 206.8  lbs  Last wt: 203 lbs Ht: 6'0"  LVAD interrogation reveals (Reviewed with Dr Haroldine Laws):  Speed: 9000 Flow: 3.3 Power: 4.3w PI: 6.6  Alarms: None Events: 10 - 20  Fixed speed:  9000 Low speed limit:  8400  Primary Controller: Replace back up battery in 25 months   Back up controller: Replace back up battery in 25 months   LVAD interrogated and functioning as expected. Interrogation Negative for elevated flows or powers; drop in PI to 2.9 - 2.1 and flow to --- with sitting up. Pt denies orthostatic symptoms. 10 - 20 PI events daily.   Driveline Site: Drive line site fully healed and incorporated. Velour is fully implanted at the site. [name] is performing weekly dressing changes using Sorbaview dressings with Biopatch on exit site. Provided with 4 dressing kits for home use.   Lumbar fusion incision completely healed with no dressing needed. [name] states dressing has been off for 3 weeks with no reported drainage or opening of wound.       Encounter Details: Presents to clinic  with his SO [name] for V1 month f/u. PI and flow drops noted with patient sitting up. Per Dr. Clayborne Dana assessment, JVP 6 - 7; pt appears dry today, pt denies symptoms of dizziness, lightheadedness, or syncope. He has not taken any prn diuretic.  [name] does report less fluid  intake over last few days.   MAP 79 - 80 on current regimen. Doppler reflecting MAP.   LABS: INR 2.0 --> Goal 2 - 2.5, no ASA, 75 mg Plavix. See anticoag note. Ate some turnip greens Monday.   Hgb 12.2 ---> 12.7 today  Patient Instructions: 1.  Will call you with lab results. 2.  Increase fluid intake to at least 2 liter daily. 3.  Return to [place] clinic in 2 months.    Zada Girt, RN VAD Coordinator  Office: [phone] 24/7 VAD Pager: [phone]

## 2016-06-18 NOTE — Patient Instructions (Signed)
1.  Will call you with lab results. 2.  Increase fluid intake to at least 2 liter daily. 3.  Return to [place] clinic in 2 months.

## 2016-06-20 ENCOUNTER — Ambulatory Visit (HOSPITAL_COMMUNITY): Payer: Self-pay | Admitting: *Deleted

## 2016-06-20 LAB — POCT INR: INR: 2.4

## 2016-06-27 ENCOUNTER — Ambulatory Visit (HOSPITAL_COMMUNITY): Payer: Self-pay | Admitting: Infectious Diseases

## 2016-06-27 LAB — POCT INR: INR: 2.4

## 2016-06-29 ENCOUNTER — Encounter: Payer: Self-pay | Admitting: Internal Medicine

## 2016-06-29 ENCOUNTER — Ambulatory Visit (INDEPENDENT_AMBULATORY_CARE_PROVIDER_SITE_OTHER): Payer: Medicare Other | Admitting: Internal Medicine

## 2016-06-29 VITALS — Ht 72.0 in | Wt 208.2 lb

## 2016-06-29 DIAGNOSIS — I472 Ventricular tachycardia, unspecified: Secondary | ICD-10-CM

## 2016-06-29 DIAGNOSIS — I480 Paroxysmal atrial fibrillation: Secondary | ICD-10-CM | POA: Diagnosis not present

## 2016-06-29 DIAGNOSIS — I5022 Chronic systolic (congestive) heart failure: Secondary | ICD-10-CM

## 2016-06-29 DIAGNOSIS — Z95810 Presence of automatic (implantable) cardiac defibrillator: Secondary | ICD-10-CM

## 2016-06-29 DIAGNOSIS — I255 Ischemic cardiomyopathy: Secondary | ICD-10-CM | POA: Diagnosis not present

## 2016-06-29 NOTE — Progress Notes (Signed)
[name] changing her Lasix to 3 times a week      Patient Care Team: [name], MD as PCP - General (Family Medicine) Jolaine Artist, MD as Consulting Physician (Cardiology) Gaye Pollack, MD as Consulting Physician (Cardiothoracic Surgery)   HPI  [name] is a 75 y.o. male Seen in followup for ventricular tachycardia occurring in the context of ischemic heart disease prior revascularization MI. He has had significant problems with ICD discharge and underwent catheter ablation November 2012. He underwent device revision with insertion of an atrial lead and generator replacement 7/14  He underwent repeat ablation August 2014 He was rendered noninducible at this procedure also. He has had recurrent ventricular tachycardia. But non since 1/17 Catheterization was recently demonstrated two-vessel coronary disease with patent stents in the chronically totally occluded RCA ejection fraction of 25-30% Myoview scan demonstrated a large inferolateral infarct.  Echocardiogram 8/14 demonstrated EF of 15-20%   He s/p  VAD insertion 2014  He has been doing relatively well. His shortness of breath with moderate exertion. He has chronic right greater than left lower extremity edema.  No interval VT-ICD therapy   Past Medical History:  Diagnosis Date  . Arthritis   . Automatic implantable cardioverter-defibrillator in situ    a. s/p Medtronic Evera device serial [id number]    . Bradycardia   . CAD (coronary artery disease)    a. s/p MI 1982;  s/p DES to CFX 2011;  s/p NSTEMI 4/12 in setting of VTach;  cath 7/12:  LM ok, LAD mid 30-40%, Dx's with 40%, mCFX stent patent, prox to mid RCA occluded with R to R and L to R collats.  Medical Tx was continued  . Chronic systolic heart failure ([place])    a. s/p LVAD 10/2013 for destination therapy  . CKD (chronic kidney disease)   . CVD (cerebrovascular disease)    a. s/p L CEA 2008  . Cytopenia   . Dysrhythmia    AFIB  . History of GI  bleed    a. on ASA- started on plavix during 10/2015 admission for CVA  . HLD (hyperlipidemia)   . HTN (hypertension)   . Hypothyroidism   . Inappropriate therapy from implantable cardioverter-defibrillator    a. ATP for sinus tach SVT wavelet identified SVT but was no  passive (2014)  . Ischemic cardiomyopathy   . 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 L Ear   . Myocardial infarction ([place])    1992  . PAF (paroxysmal atrial fibrillation) ([place])   . PVD (peripheral vascular disease) ([place])    a. 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 Shortness of breath dyspnea    W/ EXERTION   . Sleep apnea    NO CPAP NOW  HE SENT IT BACK YRS AGO  . Stroke [facility])    a. 10/2015 admission   . Ventricular tachycardia ([place])    a. s/p AICD;   h/o ICD shock;  Amiodarone Rx. S/P ablation in 2012    Past Surgical History:  Procedure Laterality Date  . BACK SURGERY    . CARDIAC CATHETERIZATION     "several" (05/25/2013)  . CARDIAC DEFIBRILLATOR PLACEMENT  2003; 2005; 05/25/2013   2014: Medtronic Evera device serial number [id number]  . CARDIAC ELECTROPHYSIOLOGY STUDY AND ABLATION  2012  . CARDIOVERSION N/A 10/15/2013  Procedure: CARDIOVERSION;  Surgeon: Jolaine Artist, MD;  Location: [place];  Service: Cardiovascular;  Laterality: N/A;  . CARDIOVERSION N/A 11/04/2013   Procedure: CARDIOVERSION;  Surgeon: Larey Dresser, MD;  Location: [place];  Service: Cardiovascular;  Laterality: N/A;  . CARDIOVERSION N/A 06/04/2014   Procedure: CARDIOVERSION;  Surgeon: Jolaine Artist, MD;  Location: [facility] ENDOSCOPY;  Service: Cardiovascular;  Laterality: N/A;  . CARDIOVERSION N/A 03/02/2015   Procedure: CARDIOVERSION;  Surgeon: Jolaine Artist, MD;  Location: [facility] ENDOSCOPY;  Service: Cardiovascular;  Laterality: N/A;  . CARDIOVERSION N/A 01/19/2016   Procedure: CARDIOVERSION;  Surgeon: Larey Dresser, MD;  Location: [place];  Service: Cardiovascular;  Laterality: N/A;  . CAROTID ENDARTERECTOMY Left ~ 2007  . CATARACT EXTRACTION W/ INTRAOCULAR LENS  IMPLANT, BILATERAL Bilateral 2000  . CHOLECYSTECTOMY  12/15/?2010  . CORONARY ANGIOPLASTY  1992  . CORONARY ANGIOPLASTY WITH STENT PLACEMENT     "last one was 11/2012  (05/25/2013)  . CYSTOSCOPY WITH RETROGRADE PYELOGRAM, URETEROSCOPY AND STENT PLACEMENT Left 08/09/2014   Procedure: CYSTOSCOPY WITH RETROGRADE PYELOGRAM, URETEROSCOPY AND STENT PLACEMENT;  Surgeon: Bernestine Amass, MD;  Location: [facility];  Service: Urology;  Laterality: Left;  . CYSTOSCOPY WITH RETROGRADE PYELOGRAM, URETEROSCOPY AND STENT PLACEMENT Left 09/01/2014   Procedure: CYSTOSCOPY WITH URETEROSCOPY AND STENT REMOVAL;  Surgeon: Bernestine Amass, MD;  Location: [facility];  Service: Urology;  Laterality: Left;  . CYSTOSCOPY WITH RETROGRADE PYELOGRAM, URETEROSCOPY AND STENT PLACEMENT Left 09/20/2014   Procedure: CYSTOSCOPY WITH RETROGRADE PYELOGRAM, URETEROSCOPY AND STENT PLACEMENT, stone removal;  Surgeon: Bernestine Amass, MD;  Location: [facility];  Service: Urology;  Laterality: Left;  . DOPPLER ECHOCARDIOGRAPHY  2011  . ELBOW ARTHROSCOPY Right 1990's  . ENDARTERECTOMY Right 01/13/2016   Procedure: RIGHT CAROTID ARTERY ENDARTERECTOMY;  Surgeon: Mal Misty, MD;  Location: [place];  Service: Vascular;  Laterality: Right;  . ESOPHAGOGASTRODUODENOSCOPY N/A 02/15/2015   Procedure: ESOPHAGOGASTRODUODENOSCOPY (EGD);  Surgeon: Carol Ada, MD;  Location: [facility] ENDOSCOPY;  Service: Endoscopy;  Laterality: N/A;  LVAD  . EYE SURGERY     VITRECTOMY  LEFT 05/2012  . FEMORAL-POPLITEAL BYPASS GRAFT Right 2011  . FEMORAL-POPLITEAL BYPASS GRAFT Left 05/2002   Archie Endo 05/06/2002 (05/25/2013)  . HEEL SPUR SURGERY Right 1990's  . HOLMIUM LASER APPLICATION Left [id number]   Procedure: HOLMIUM LASER APPLICATION;  Surgeon: Bernestine Amass, MD;  [facility];  Service: Urology;  Laterality: Left;  . IMPLANTABLE CARDIOVERTER  DEFIBRILLATOR GENERATOR CHANGE N/A 05/25/2013   Procedure: IMPLANTABLE CARDIOVERTER DEFIBRILLATOR GENERATOR CHANGE;  Surgeon: Deboraha Sprang, MD;  Location: [facility] CATH LAB;  Service: Cardiovascular;  Laterality: N/A;  . INSERTION OF IMPLANTABLE LEFT VENTRICULAR ASSIST DEVICE N/A 10/19/2013   Procedure: INSERTION OF IMPLANTABLE LEFT VENTRICULAR ASSIST DEVICE;  Surgeon: Gaye Pollack, MD;  Location: [place];  Service: Open Heart Surgery;  Laterality: N/A;  CIRC ARREST  NITRIC OXIDE  MEDTRONIC ICD  . INTRAOPERATIVE TRANSESOPHAGEAL ECHOCARDIOGRAM N/A 10/19/2013   Procedure: INTRAOPERATIVE TRANSESOPHAGEAL ECHOCARDIOGRAM;  Surgeon: Gaye Pollack, MD;  Location: [facility] OR;  Service: Open Heart Surgery;  Laterality: N/A;  . KNEE ARTHROSCOPY Bilateral 1990's  . LEAD REVISION N/A 06/05/2013   Procedure: LEAD REVISION;  Surgeon: Thompson Grayer, MD;  Location: [facility] CATH LAB;  Service: Cardiovascular;  Laterality: N/A;  . LEFT HEART CATHETERIZATION WITH CORONARY ANGIOGRAM N/A 11/24/2012   Procedure: LEFT HEART CATHETERIZATION WITH CORONARY ANGIOGRAM;  Surgeon: Peter M Martinique, MD;  Location: [facility] CATH LAB;  Service: Cardiovascular;  Laterality: N/A;  .  LIPOMA EXCISION Left 2013   "near ear" (05/25/2013)  . [place]; 2000's  . PATCH ANGIOPLASTY Right 01/13/2016   Procedure: WITH  PATCH ANGIOPLASTY;  Surgeon: Mal Misty, MD;  Location: [place];  Service: Vascular;  Laterality: Right;  . RENAL ARTERY BYPASS Bilateral 2003  . REVASCULARIZATION / IN-SITU GRAFT LEG    . RIGHT HEART CATHETERIZATION N/A 09/17/2013   Procedure: RIGHT HEART CATH;  Surgeon: Jolaine Artist, MD;  Location: [facility] CATH LAB;  Service: Cardiovascular;  Laterality: N/A;  . RIGHT HEART CATHETERIZATION N/A 10/14/2013   Procedure: RIGHT HEART CATH;  Surgeon: Jolaine Artist, MD;  Location: [facility] CATH LAB;  Service: Cardiovascular;  Laterality: N/A;  . RIGHT HEART CATHETERIZATION N/A 11/16/2013   Procedure: RIGHT HEART CATH;  Surgeon: Jolaine Artist, MD;  Location: [facility] CATH LAB;  Service: Cardiovascular;  Laterality: N/A;  . RIGHT HEART CATHETERIZATION N/A 07/13/2014   Procedure: RIGHT HEART CATH;  Surgeon: Jolaine Artist, MD;  Location: [facility] CATH LAB;  Service: Cardiovascular;  Laterality: N/A;  . TEE WITHOUT CARDIOVERSION N/A 11/04/2013   Procedure: TRANSESOPHAGEAL ECHOCARDIOGRAM (TEE);  Surgeon: Larey Dresser, MD;  Location: [place];  Service: Cardiovascular;  Laterality: N/A;  . TEE WITHOUT CARDIOVERSION N/A 03/02/2015   Procedure: TRANSESOPHAGEAL ECHOCARDIOGRAM (TEE);  Surgeon: Jolaine Artist, MD;  Location: [facility] ENDOSCOPY;  Service: Cardiovascular;  Laterality: N/A;  . TEE WITHOUT CARDIOVERSION N/A 01/19/2016   Procedure: TRANSESOPHAGEAL ECHOCARDIOGRAM (TEE);  Surgeon: Larey Dresser, MD;  Location: [place];  Service: Cardiovascular;  Laterality: N/A;  . TONSILLECTOMY  1946  . TUMOR EXCISION Left 1960's   "fatty tumor" (05/25/2013)  . V-TACH ABLATION N/A 09/12/2011   Procedure: V-TACH ABLATION;  Surgeon: Evans Lance, MD;  Location: [facility] CATH LAB;  Service: Cardiovascular;  Laterality: N/A;  . V-TACH ABLATION N/A 06/08/2013   Procedure: V-TACH ABLATION;  Surgeon: Evans Lance, MD;  Location: [facility] CATH LAB;  Service: Cardiovascular;  Laterality: N/A;    Current Outpatient Prescriptions  Medication Sig Dispense Refill  . amiodarone (PACERONE) 200 MG tablet Take 0.5 tablets (100 mg total) by mouth daily. 30 tablet 6  . amLODipine (NORVASC) 10 MG tablet Take 1 tablet (10 mg total) by mouth at bedtime. 30 tablet 6  . atorvastatin (LIPITOR) 80 MG tablet Take 80 mg by mouth daily.     . clopidogrel (PLAVIX) 75 MG tablet Take 1 tablet (75 mg total) by mouth daily. 90 tablet 3  . docusate sodium (COLACE) 100 MG capsule Take 100 mg by mouth daily.     . furosemide (LASIX) 20 MG tablet Take 1 tablet (20 mg total) by mouth as needed for edema (weight gain). 30 tablet 3  . levothyroxine (SYNTHROID, LEVOTHROID) 200 MCG  tablet Take one 200 mcg daily before breakfast every day with 1 1/2 (300 mcg) every Monday. (Patient taking differently: Take 200-300 mcg by mouth daily before breakfast. Take 1 tablet (222mcg) all days except on Mondays take 1&1/2 tablets (389mcg)) 45 tablet 6  . metoprolol succinate (TOPROL-XL) 25 MG 24 hr tablet Take 1 tablet (25 mg total) by mouth 2 (two) times daily. 180 tablet 3  . Multiple Vitamins-Minerals (ICAPS PO) Take 1 tablet by mouth 2 (two) times daily.    . pantoprazole (PROTONIX) 40 MG tablet Take 1 tablet (40 mg total) by mouth 2 (two) times daily. 60 tablet 6  . ranolazine (RANEXA) 500 MG 12 hr tablet TAKE 1 TABLET (500 MG TOTAL) BY  MOUTH 2 (TWO) TIMES DAILY. 60 tablet 6  . warfarin (COUMADIN) 2 MG tablet Take 2-4 mg by mouth daily. Take 1 tablet (2mg ) all days except on Monday take 2 tablets (4mg )    . zinc gluconate 50 MG tablet Take 50 mg by mouth 2 (two) times daily.      No current facility-administered medications for this visit.     Allergies  Allergen Reactions  . Demerol [Meperidine] Other (See Comments)    Paralysis. Could only move eyes.    Review of Systems negative except from HPI and PMH  Physical Exam Ht 6' (1.829 m)   Wt 208 lb 3.2 oz (94.4 kg)   BMI 28.24 kg/m  Well developed and well nourished in no acute distress HENT normal E scleral and icterus clear Neck Supple JVP8-10 ; carotids brisk and full Clear to ausculation  Regular rate and rhythm, LVAD whirring  Soft with active bowel sounds No clubbing cyanosis  1-2+Edema Alert and oriented, grossly normal motor and sensory function Skin Warm and Dry    Assessment and  Plan  CHF -acute chronic systolic   ICD-Medtronic The patient's device was interrogated.  The information was reviewed. No changes were made in the programming.   '  LVAD  ISCHEMIC CARDIOMYOPATHY  TIA/CVA  VT  AFib  Loss of R waves-ICD  Treated hypothyroidism  Sinus node dysfunction      Have Discussed  with Dr. Reine Just. We will activate rate response.  Edema is chronic.  No significant interval atrial fibrillation.  Continue ranolazine and amiodarone  Surveillance laboratories normal June/17 a year

## 2016-06-29 NOTE — Patient Instructions (Addendum)
Medication Instructions: - Your physician recommends that you continue on your current medications as directed. Please refer to the Current Medication list given to you today.  Labwork: - none today  Procedures/Testing: - none   Follow-Up: - Remote monitoring is used to monitor your Pacemaker of ICD from home. This monitoring reduces the number of office visits required to check your device to one time per year. It allows [place] to keep an eye on the functioning of your device to ensure it is working properly. You are scheduled for a device check from home on 10/02/16. You may send your transmission at any time that day. If you have a wireless device, the transmission will be sent automatically. After your physician reviews your transmission, you will receive a postcard with your next transmission date.   - Your physician wants you to follow-up in: 1 year with Dr. Caryl Comes. You will receive a reminder letter in the mail two months in advance. If you don't receive a letter, please call our office to schedule the follow-up appointment.  Any Additional Special Instructions Will Be Listed Below (If Applicable).     If you need a refill on your cardiac medications before your next appointment, please call your pharmacy.

## 2016-07-04 ENCOUNTER — Ambulatory Visit (HOSPITAL_COMMUNITY): Payer: Self-pay | Admitting: *Deleted

## 2016-07-04 LAB — POCT INR: INR: 2.4

## 2016-07-05 NOTE — Progress Notes (Signed)
Patient ID: [name], male   DOB: Feb 01, 1941, 75 y.o.   MRN: [id number]   VAD CLINIC PROGRESS NOTE  PCP: Dr. Kandice Robinsons ([place]) GU: Dr Risa Grill  Neuro Surgeon: Dr Vertell Limber Vascular: Dr Kellie Simmering Primary HF: Dr Haroldine Laws  [name] is a 75 yo male with h/o VT, PAD and severe systolic HF (EF 0000000) due to mixed ischemic/nonischemic CM he is s/p HM II LVAD implant for destination therapy on October 19, 2013. On 09/01/14 underwent cystoscopy and flexible ureteroscopy with lithotripsy and basket extraction.   VAD implantation was complicated by VT, syncope, A fib/Aflutter and persistent low output felt to be due to RHF. Required short term milrinone.   GU Event 09/2015 Hematuria and chronic nephrolithiasis. Renal US showed mild hyronephrosis and bilateral renal cysts. Dr. Risa Grill took back for repeat lithotripsy and stone extraction by ureteroscopy and J stent placement.  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  01/2016 R CEA. He was started on Plavix.  01/2016 Lumbar fusion L3-L5 with pedicle screws posterolateral arthrodesis with BMP, allograft  Follow up LVAD Clinic: Presents for LVAD follow up. Overall feeling pretty good. Back pain is improved and wound is healing. Trying to get around more  No fevers, chills or drainage. Denies SOB/PND/Orthopnea. Denies dizziness but does note PI drops with standing up. Weight at home is relatively stable. Takes lasix as needed. Good appetite but not drinking much Taking all medications. No  palpitations. No bleeding, dark stools, neuro symptoms or dark urine.    LVAD interrogation reveals:  Speed: 9000 Flow: 3.3 Power: 4.3w PI: 6.6  Alarms: None Events: 10 - 20  Fixed speed:  9000 Low speed limit:  8400  Primary Controller: Replace back up battery in 25 months   Back up controller: Replace back up battery in 25 months   LVAD  interrogated and functioning as expected. Interrogation Negative for elevated flows or powers; drop in PI to 2.9 - 2.1 and flow to --- with sitting up. Pt denies orthostatic symptoms. 10 - 20 PI events daily.   Past Medical History:  Diagnosis Date  . Arthritis   . Automatic implantable cardioverter-defibrillator in situ    a. s/p Medtronic Evera device serial [id number]    . Bradycardia   . CAD (coronary artery disease)    a. s/p MI 1982;  s/p DES to CFX 2011;  s/p NSTEMI 4/12 in setting of VTach;  cath 7/12:  LM ok, LAD mid 30-40%, Dx's with 40%, mCFX stent patent, prox to mid RCA occluded with R to R and L to R collats.  Medical Tx was continued  . Chronic systolic heart failure ([place])    a. s/p LVAD 10/2013 for destination therapy  . CKD (chronic kidney disease)   . CVD (cerebrovascular disease)    a. s/p L CEA 2008  . Cytopenia   . Dysrhythmia    AFIB  . History of GI bleed    a. on ASA- started on plavix during 10/2015 admission for CVA  . HLD (hyperlipidemia)   . HTN (hypertension)   . Hypothyroidism   . Inappropriate therapy from implantable cardioverter-defibrillator    a. ATP for sinus tach SVT wavelet identified SVT but was no  passive (2014)  . Ischemic cardiomyopathy   . 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 L Ear   . Myocardial infarction ([place])  1992  . PAF (paroxysmal atrial fibrillation) ([place])   . PVD (peripheral vascular disease) ([place])    a. 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 Shortness of breath dyspnea    W/ EXERTION   . Sleep apnea    NO CPAP NOW  HE SENT IT BACK YRS AGO  . Stroke [facility])    a. 10/2015 admission   . Ventricular tachycardia ([place])    a. s/p AICD;   h/o ICD shock;  Amiodarone Rx. S/P ablation in 2012    Current Outpatient Prescriptions  Medication Sig Dispense Refill  . amiodarone (PACERONE) 200 MG tablet Take 0.5 tablets (100 mg total) by mouth daily.  30 tablet 6  . amLODipine (NORVASC) 10 MG tablet Take 1 tablet (10 mg total) by mouth at bedtime. 30 tablet 6  . atorvastatin (LIPITOR) 80 MG tablet Take 80 mg by mouth daily.     . clopidogrel (PLAVIX) 75 MG tablet Take 1 tablet (75 mg total) by mouth daily. 90 tablet 3  . docusate sodium (COLACE) 100 MG capsule Take 100 mg by mouth daily.     [name] levothyroxine (SYNTHROID, LEVOTHROID) 200 MCG tablet Take one 200 mcg daily before breakfast every day with 1 1/2 (300 mcg) every Monday. (Patient taking differently: Take 200-300 mcg by mouth daily before breakfast. Take 1 tablet (255mcg) all days except on Mondays take 1&1/2 tablets (353mcg)) 45 tablet 6  . metoprolol succinate (TOPROL-XL) 25 MG 24 hr tablet Take 1 tablet (25 mg total) by mouth 2 (two) times daily. 180 tablet 3  . Multiple Vitamins-Minerals (ICAPS PO) Take 1 tablet by mouth 2 (two) times daily.    . pantoprazole (PROTONIX) 40 MG tablet Take 1 tablet (40 mg total) by mouth 2 (two) times daily. 60 tablet 6  . ranolazine (RANEXA) 500 MG 12 hr tablet TAKE 1 TABLET (500 MG TOTAL) BY MOUTH 2 (TWO) TIMES DAILY. 60 tablet 6  . warfarin (COUMADIN) 2 MG tablet Take 2-4 mg by mouth daily. Take 1 tablet (2mg ) all days except on Monday take 2 tablets (4mg )    . zinc gluconate 50 MG tablet Take 50 mg by mouth 2 (two) times daily.     . furosemide (LASIX) 20 MG tablet Take 1 tablet (20 mg total) by mouth as needed for edema (weight gain). 30 tablet 3   No current facility-administered medications for this encounter.     Demerol [meperidine]  REVIEW OF SYSTEMS: All systems negative except as listed in HPI, PMH and Problem list.  Vital signs: HR: 60 Doppler MAP: 80 Auto cuff: 104/65 (79) O2 Sat: 99% Wt: 206.8  lbs  Last wt: 203 lbs Ht: 6'0"  Physical Exam: GENERAL: NAD. Ambulated in the clinic without difficulty  HEENT: normal NECK: Supple, JVP 6-7  no carotid bruits. R CEA scar well-healed No lymphadenopathy or thyromegaly appreciated.     CARDIAC:  Mechanical heart sounds with LVAD hum present.  LUNGS:  clear.  ABDOMEN:  Soft, round, nontender, positive bowel sounds x4.   LVAD exit site: well-healed and incorporated.  Dressing dry and intact.  No erythema or drainage.  Stabilization device present and accurately applied.  Driveline dressing is being changed weekly per sterile technique. EXTREMITIES:  Warm and dry, no cyanosis, clubbing, R and LLE trace edema  NEUROLOGIC:  Alert and oriented x 4.  Gait  steady.  No aphasia.  No dysarthria.  Affect pleasant. Grossly normal bilateral upper and lower extremity strength.  Wound: lumbar wound. Almost completely healed  ASSESSMENT AND PLAN:  1. Chronic systolic HF: s/p LVAD [id number].  Remains NYHA II.   - NYHA II-III. Limited by orthopedic issues but improving. Volume status probably on dry side. Increase fluid intake.  Continue to take lasix as needed.    2.  Atrial arrhythmias: Atrial fibrillation, atrial tachycardia.  S/p DC-CV 06/04/14, DC-CV 03/02/15 and 01/19/16.  - Regular rhythm. Continue amio to 100 mg daily.   3. HTN: MAP stable.  4. LVAD: LVAD parameters reviewed personally, PI drops with standing. Hgb stable. Likely dry. Push fluids for a few days. 5. CKD, stage III:  Recent creatinine ok. Recheck today.  6. Anticoagulation management: Goal INR 2-2.5. INR 2.0 today. He is off ASA with GI bleeding. Continue Plavix. Uses home monitor. 7. Nephrolithiasis s/p Renal Stone lithotripsy: Per Dr Risa Grill. Stable. 8. 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 Continue coumadin and plavix.(with h/o CVA). Recent CBC ok.  No bleeding problems.    9. Lumbar compression fracture: s/p L3-L5 fusion 02/03/16. He has full thickness wound from hematoma.  Wound being managed by Darrick Grinder, NP. Now almost completely healed. 10. 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  Continue atorvastatin. (also warfarin INR goal 2-2.5). S/p R CEA 01/13/16. Continue  plavix.  11. H/o amio-induced  hypethyroidism: Followed by Dr. Forde Dandy. Amio restarted peri-operatively at low dose due to recurrent atrial arrhythmias. Recent TSH ok on 05/02/16   Moss Berry,MD 12:05 AM

## 2016-07-06 LAB — CUP PACEART INCLINIC DEVICE CHECK
Date Time Interrogation Session: 20170901094410
Implantable Lead Implant Date: 20030714
Implantable Lead Location: 753860
Lead Channel Pacing Threshold Pulse Width: 0.4 ms
Lead Channel Pacing Threshold Pulse Width: 0.4 ms
Lead Channel Sensing Intrinsic Amplitude: 2.1 mV
MDC IDC LEAD IMPLANT DT: 20140801
MDC IDC LEAD LOCATION: 753859
MDC IDC LEAD MODEL: 5592
MDC IDC LEAD MODEL: 6944
MDC IDC MSMT LEADCHNL RA PACING THRESHOLD AMPLITUDE: 1 V
MDC IDC MSMT LEADCHNL RV PACING THRESHOLD AMPLITUDE: 0.75 V
MDC IDC MSMT LEADCHNL RV SENSING INTR AMPL: 1.6 mV

## 2016-07-11 ENCOUNTER — Ambulatory Visit (HOSPITAL_COMMUNITY): Payer: Self-pay | Admitting: Unknown Physician Specialty

## 2016-07-11 LAB — POCT INR: INR: 2.8

## 2016-07-11 MED ORDER — AMLODIPINE BESYLATE 10 MG PO TABS: 10.0000 mg | ORAL_TABLET | Freq: Every day | ORAL | 6 refills | Status: DC

## 2016-07-17 ENCOUNTER — Telehealth (HOSPITAL_COMMUNITY): Payer: Self-pay | Admitting: *Deleted

## 2016-07-17 ENCOUNTER — Other Ambulatory Visit (HOSPITAL_COMMUNITY): Payer: Self-pay | Admitting: *Deleted

## 2016-07-17 DIAGNOSIS — Z7901 Long term (current) use of anticoagulants: Secondary | ICD-10-CM

## 2016-07-17 DIAGNOSIS — Z95811 Presence of heart assist device: Secondary | ICD-10-CM

## 2016-07-17 NOTE — Telephone Encounter (Signed)
Called pt re: continued low PI's. Pt reports PI's running in mid 3 range, Flow 3.4 - 4.1 (isn't seeing --- flows anymore), power 4.5 - 5.0. Pt says he "came down with cold" last night and isn't feeling well from that and c/o overall "weak" feeling. Denies dizziness, lightheadedness, syncope with orthostatic changes, denies HF symptoms, palpitations, ICD shocks, or dark urine.  States he hasn't taken PRN lasix for last "month".    Per Dr. Haroldine Laws, pt will need ramp echo to assess LVAD. Scheduled ramp echo next Thursday, 07/26/16 at 11:00 am. VAD clinic appointment scheduled for 12:00 as well. Pt will need full set of labs. Pt verbalized understanding of same.

## 2016-07-18 ENCOUNTER — Telehealth (HOSPITAL_COMMUNITY): Payer: Self-pay | Admitting: Infectious Diseases

## 2016-07-18 ENCOUNTER — Inpatient Hospital Stay (HOSPITAL_COMMUNITY)
Admission: EM | Admit: 2016-07-18 | Discharge: 2016-07-23 | DRG: 202 | Disposition: A | Discharge status: Home / Self Care | Payer: Medicare Other | Attending: Internal Medicine | Admitting: Internal Medicine

## 2016-07-18 ENCOUNTER — Encounter (HOSPITAL_COMMUNITY): Payer: Self-pay | Admitting: *Deleted

## 2016-07-18 ENCOUNTER — Ambulatory Visit (HOSPITAL_COMMUNITY): Payer: Self-pay | Admitting: *Deleted

## 2016-07-18 ENCOUNTER — Emergency Department (HOSPITAL_COMMUNITY): Payer: Medicare Other

## 2016-07-18 DIAGNOSIS — Z7902 Long term (current) use of antithrombotics/antiplatelets: Secondary | ICD-10-CM

## 2016-07-18 DIAGNOSIS — Z955 Presence of coronary angioplasty implant and graft: Secondary | ICD-10-CM

## 2016-07-18 DIAGNOSIS — I5023 Acute on chronic systolic (congestive) heart failure: Secondary | ICD-10-CM | POA: Diagnosis not present

## 2016-07-18 DIAGNOSIS — M4856XD Collapsed vertebra, not elsewhere classified, lumbar region, subsequent encounter for fracture with routine healing: Secondary | ICD-10-CM | POA: Diagnosis present

## 2016-07-18 DIAGNOSIS — E039 Hypothyroidism, unspecified: Secondary | ICD-10-CM | POA: Diagnosis present

## 2016-07-18 DIAGNOSIS — Z87891 Personal history of nicotine dependence: Secondary | ICD-10-CM

## 2016-07-18 DIAGNOSIS — I5022 Chronic systolic (congestive) heart failure: Secondary | ICD-10-CM | POA: Diagnosis not present

## 2016-07-18 DIAGNOSIS — Z79899 Other long term (current) drug therapy: Secondary | ICD-10-CM

## 2016-07-18 DIAGNOSIS — Z87898 Personal history of other specified conditions: Secondary | ICD-10-CM

## 2016-07-18 DIAGNOSIS — J209 Acute bronchitis, unspecified: Secondary | ICD-10-CM | POA: Diagnosis not present

## 2016-07-18 DIAGNOSIS — R0602 Shortness of breath: Secondary | ICD-10-CM | POA: Diagnosis not present

## 2016-07-18 DIAGNOSIS — I6521 Occlusion and stenosis of right carotid artery: Secondary | ICD-10-CM | POA: Diagnosis present

## 2016-07-18 DIAGNOSIS — Z7901 Long term (current) use of anticoagulants: Secondary | ICD-10-CM

## 2016-07-18 DIAGNOSIS — N183 Chronic kidney disease, stage 3 (moderate): Secondary | ICD-10-CM | POA: Diagnosis present

## 2016-07-18 DIAGNOSIS — Z95811 Presence of heart assist device: Secondary | ICD-10-CM

## 2016-07-18 DIAGNOSIS — I472 Ventricular tachycardia, unspecified: Secondary | ICD-10-CM

## 2016-07-18 DIAGNOSIS — I252 Old myocardial infarction: Secondary | ICD-10-CM

## 2016-07-18 DIAGNOSIS — R509 Fever, unspecified: Secondary | ICD-10-CM | POA: Diagnosis not present

## 2016-07-18 DIAGNOSIS — Z23 Encounter for immunization: Secondary | ICD-10-CM

## 2016-07-18 DIAGNOSIS — I4892 Unspecified atrial flutter: Secondary | ICD-10-CM

## 2016-07-18 DIAGNOSIS — E875 Hyperkalemia: Secondary | ICD-10-CM | POA: Diagnosis present

## 2016-07-18 DIAGNOSIS — E860 Dehydration: Secondary | ICD-10-CM | POA: Diagnosis present

## 2016-07-18 DIAGNOSIS — R531 Weakness: Secondary | ICD-10-CM

## 2016-07-18 DIAGNOSIS — I471 Supraventricular tachycardia: Secondary | ICD-10-CM

## 2016-07-18 DIAGNOSIS — Z8673 Personal history of transient ischemic attack (TIA), and cerebral infarction without residual deficits: Secondary | ICD-10-CM

## 2016-07-18 DIAGNOSIS — I130 Hypertensive heart and chronic kidney disease with heart failure and stage 1 through stage 4 chronic kidney disease, or unspecified chronic kidney disease: Secondary | ICD-10-CM | POA: Diagnosis present

## 2016-07-18 DIAGNOSIS — I480 Paroxysmal atrial fibrillation: Secondary | ICD-10-CM | POA: Diagnosis present

## 2016-07-18 DIAGNOSIS — Z95810 Presence of automatic (implantable) cardiac defibrillator: Secondary | ICD-10-CM

## 2016-07-18 DIAGNOSIS — Z885 Allergy status to narcotic agent status: Secondary | ICD-10-CM

## 2016-07-18 DIAGNOSIS — Z8249 Family history of ischemic heart disease and other diseases of the circulatory system: Secondary | ICD-10-CM

## 2016-07-18 DIAGNOSIS — Z823 Family history of stroke: Secondary | ICD-10-CM

## 2016-07-18 LAB — POCT INR: INR: 2.3

## 2016-07-18 MED ORDER — ACETAMINOPHEN 500 MG PO TABS
1000.0000 mg | ORAL_TABLET | Freq: Four times a day (QID) | ORAL | Status: DC | PRN
  Administered 2016-07-19: 1000 mg via ORAL
  Filled 2016-07-18: qty 2

## 2016-07-18 MED ORDER — PIPERACILLIN-TAZOBACTAM 3.375 G IVPB 30 MIN: 3.3750 g | Freq: Once | INTRAVENOUS | Status: DC

## 2016-07-18 MED ORDER — PIPERACILLIN-TAZOBACTAM 3.375 G IVPB 30 MIN
3.3750 g | Freq: Once | INTRAVENOUS | Status: AC
  Administered 2016-07-19: 3.375 g via INTRAVENOUS
  Filled 2016-07-18: qty 50

## 2016-07-18 MED ORDER — SODIUM CHLORIDE 0.9 % IV BOLUS (SEPSIS)
1000.0000 mL | Freq: Once | INTRAVENOUS | Status: AC
  Administered 2016-07-19: 1000 mL via INTRAVENOUS

## 2016-07-18 NOTE — ED Triage Notes (Signed)
Patient presents with SOB and weakness, cough (non productive)

## 2016-07-18 NOTE — H&P (Signed)
VAD TEAM History & Physical Note   Reason for Admission: Fever, cough, weakness  PCP: Dr Kandice Robinsons Primary Cardiologist: Dr. Haroldine Laws   HPI:    [name] is a 75 yo male with h/o VT, PAD and severe systolic HF (EF 0000000) due to mixed ischemic/nonischemic CM he is s/p HM II LVAD implant for destination therapy on October 19, 2013.   GU Event 09/2015 Hematuria and chronic nephrolithiasis. Renal US showed mild hyronephrosis and bilateral renal cysts. Dr. Risa Grill took back for repeat lithotripsy and stone extraction by ureteroscopy and J stent placement.  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  01/2016 R CEA. He was started on Plavix.  01/2016 Lumbar fusion L3-L5 with pedicle screws posterolateral arthrodesis with BMP, allograft  Admission in [id number] complicated by recurrent AF. Started back on low-dose amio. Underwent DC-CV with prompt conversion to NSR. Post back surgery had slow wound healing but recovered.  In clinic has been having problems with low flows on LVAD and ramp echo scheduled.   Presents to ER today with 24 hours of high fever, cough with greyish sticky sputum, dyspnea and weakness. + 1 episode of vomiting. Minimal po intake over past 2 days.  Denies orthopnea, PND, CP or weight gain.   In ER febrile to 103. Flushed. Weak. CXR clear. Urine dark. [place] drawn and started on vanc/zosyn for presumed sepsis. CBC pending. Driveline site and back wound ok. No diarrhea or ab pain.   LVAD INTERROGATION:  HeartMate II LVAD:  Flow 4.2 liters/min, speed 9000, power 4.8, PI 4.7.    Review of Systems: [y] = yes, [ ]  = no   General: Weight gain [ ] ; Weight loss [ ] ; Anorexia [ y]; Fatigue [ y]; Fever Blue.Reese ]; Chills [ ] ; Weakness Blue.Reese ]  Cardiac: Chest pain/pressure [ ] ; Resting SOB Blue.Reese ]; Exertional SOB [ y]; Orthopnea [ ] ; Pedal Edema Blue.Reese ]; Palpitations [ ] ; Syncope [ ] ;  Presyncope [ ] ; Paroxysmal nocturnal dyspnea[ ]   Pulmonary: Cough [y]; Wheezing[ ] ; Hemoptysis[ ] ; Sputum Blue.Reese ]; Snoring [ ]   GI: Vomiting[ ] ; Dysphagia[ ] ; Melena[ ] ; Hematochezia [ ] ; Heartburn[ ] ; Abdominal pain [ ] ; Constipation [ ] ; Diarrhea [ ] ; BRBPR [ ]   GU: Hematuria[ ] ; Dysuria [ ] ; Nocturia[ ]   Vascular: Pain in legs with walking [ ] ; Pain in feet with lying flat [ ] ; Non-healing sores [ ] ; Stroke [ ] ; TIA [ ] ; Slurred speech [ ] ;  Neuro: Headaches[ ] ; Vertigo[ ] ; Seizures[ ] ; Paresthesias[ ] ;Blurred vision [ ] ; Diplopia [ ] ; Vision changes [ ]   Ortho/Skin: Arthritis [y]; Joint pain [y]; Muscle pain [ ] ; Joint swelling [ ] ; Back Pain Blue.Reese ]; Rash [ ]   Psych: Depression[ ] ; Anxiety[ ]   Heme: Bleeding problems [ y]; Clotting disorders [ ] ; Anemia [ y]  Endocrine: Diabetes [ ] ; Thyroid dysfunction[y ]   Home Medications Prior to Admission medications   Medication Sig Start Date End Date Taking? Authorizing Provider  amiodarone (PACERONE) 200 MG tablet Take 1 tablet (200 mg total) by mouth daily. 01/20/16   Shirley Friar, PA-C  amLODipine (NORVASC) 10 MG tablet Take 1 tablet (10 mg total) by mouth at bedtime. 11/07/15   Eileen Stanford, PA-C  atorvastatin (LIPITOR) 80 MG tablet Take 80 mg by mouth daily.  Historical Provider, MD  docusate sodium (COLACE) 100 MG capsule Take 100 mg by mouth daily as needed for mild constipation.     Historical Provider, MD  ferrous gluconate (FERGON) 324 MG tablet Take 1 tablet (324 mg total) by mouth 2 (two) times daily with a meal. 06/23/15   Amy D Clegg, NP  furosemide (LASIX) 20 MG tablet Take 1 tablet (20 mg total) by mouth as needed for edema (weight gain). 11/14/15   Jolaine Artist, MD  guaiFENesin (MUCINEX) 600 MG 12 hr tablet Take 1 tablet (600 mg total) by mouth 2 (two) times daily. 01/20/16   Shirley Friar, PA-C  levothyroxine (SYNTHROID, LEVOTHROID) 200 MCG tablet Take one 200 mcg daily before breakfast every day with 1 1/2  (300 mcg) every Monday. Patient taking differently: Take 200-300 mcg by mouth daily before breakfast. Take 1 tablet (273mcg) all days except on Mondays take 1&1/2 tablets (370mcg) 10/26/15   Jolaine Artist, MD  metoprolol succinate (TOPROL-XL) 25 MG 24 hr tablet Take 1 tablet (25 mg total) by mouth 2 (two) times daily. 12/29/15   Jolaine Artist, MD  Multiple Vitamins-Minerals (ICAPS PO) Take 1 tablet by mouth 2 (two) times daily.    Historical Provider, MD  nitroGLYCERIN (NITROSTAT) 0.4 MG SL tablet Place 0.4 mg under the tongue every 5 (five) minutes as needed for chest pain.    Historical Provider, MD  pantoprazole (PROTONIX) 40 MG tablet TAKE 1 TABLET EVERY 12 HOURS. 12/19/15   Jolaine Artist, MD  promethazine (PHENERGAN) 12.5 MG tablet Take 12.5 mg by mouth every 6 (six) hours as needed for nausea or vomiting.    Historical Provider, MD  ranolazine (RANEXA) 500 MG 12 hr tablet TAKE 1 TABLET (500 MG TOTAL) BY MOUTH 2 (TWO) TIMES DAILY. 01/20/16   Shirley Friar, PA-C  warfarin (COUMADIN) 2 MG tablet Take 2-4 mg by mouth daily. Reported on 01/31/2016    Historical Provider, MD  zinc gluconate 50 MG tablet Take 50 mg by mouth 2 (two) times daily.     Historical Provider, MD    Past Medical History: Past Medical History:  Diagnosis Date  . Arthritis   . Automatic implantable cardioverter-defibrillator in situ    a. s/p Medtronic Evera device serial [id number]    . Bradycardia   . CAD (coronary artery disease)    a. s/p MI 1982;  s/p DES to CFX 2011;  s/p NSTEMI 4/12 in setting of VTach;  cath 7/12:  LM ok, LAD mid 30-40%, Dx's with 40%, mCFX stent patent, prox to mid RCA occluded with R to R and L to R collats.  Medical Tx was continued  . Chronic systolic heart failure ([place])    a. s/p LVAD 10/2013 for destination therapy  . CKD (chronic kidney disease)   . CVD (cerebrovascular disease)    a. s/p L CEA 2008  . Cytopenia   . Dysrhythmia    AFIB  . History of GI bleed     a. on ASA- started on plavix during 10/2015 admission for CVA  . HLD (hyperlipidemia)   . HTN (hypertension)   . Hypothyroidism   . Inappropriate therapy from implantable cardioverter-defibrillator    a. ATP for sinus tach SVT wavelet identified SVT but was no  passive (2014)  . Ischemic cardiomyopathy   . 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 L Ear   . Myocardial infarction ([place])    1992  . PAF (paroxysmal atrial fibrillation) ([place])   .  PVD (peripheral vascular disease) ([place])    a. 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 Shortness of breath dyspnea    W/ EXERTION   . Sleep apnea    NO CPAP NOW  HE SENT IT BACK YRS AGO  . Stroke [facility])    a. 10/2015 admission   . Ventricular tachycardia ([place])    a. s/p AICD;   h/o ICD shock;  Amiodarone Rx. S/P ablation in 2012    Past Surgical History: Past Surgical History:  Procedure Laterality Date  . BACK SURGERY    . CARDIAC CATHETERIZATION     "several" (05/25/2013)  . CARDIAC DEFIBRILLATOR PLACEMENT  2003; 2005; 05/25/2013   2014: Medtronic Evera device serial number [id number]  . CARDIAC ELECTROPHYSIOLOGY STUDY AND ABLATION  2012  . CARDIOVERSION N/A 10/15/2013   Procedure: CARDIOVERSION;  Surgeon: Jolaine Artist, MD;  Location: [place];  Service: Cardiovascular;  Laterality: N/A;  . CARDIOVERSION N/A 11/04/2013   Procedure: CARDIOVERSION;  Surgeon: Larey Dresser, MD;  Location: [place];  Service: Cardiovascular;  Laterality: N/A;  . CARDIOVERSION N/A 06/04/2014   Procedure: CARDIOVERSION;  Surgeon: Jolaine Artist, MD;  Location[facility] ENDOSCOPY;  Service: Cardiovascular;  Laterality: N/A;  . CARDIOVERSION N/A 03/02/2015   Procedure: CARDIOVERSION;  Surgeon: Jolaine Artist, MD;  Location: [facility] ENDOSCOPY;  Service: Cardiovascular;  Laterality: N/A;  . CARDIOVERSION N/A 01/19/2016   Procedure: CARDIOVERSION;  Surgeon: Larey Dresser, MD;   Location: [place];  Service: Cardiovascular;  Laterality: N/A;  . CAROTID ENDARTERECTOMY Left ~ 2007  . CATARACT EXTRACTION W/ INTRAOCULAR LENS  IMPLANT, BILATERAL Bilateral 2000  . CHOLECYSTECTOMY  12/15/?2010  . CORONARY ANGIOPLASTY  1992  . CORONARY ANGIOPLASTY WITH STENT PLACEMENT     "last one was 11/2012  (05/25/2013)  . CYSTOSCOPY WITH RETROGRADE PYELOGRAM, URETEROSCOPY AND STENT PLACEMENT Left 08/09/2014   Procedure: CYSTOSCOPY WITH RETROGRADE PYELOGRAM, URETEROSCOPY AND STENT PLACEMENT;  Surgeon: Bernestine Amass, MD;  Location: [facility];  Service: Urology;  Laterality: Left;  . CYSTOSCOPY WITH RETROGRADE PYELOGRAM, URETEROSCOPY AND STENT PLACEMENT Left 09/01/2014   Procedure: CYSTOSCOPY WITH URETEROSCOPY AND STENT REMOVAL;  Surgeon: Bernestine Amass, MD;  Location: [facility];  Service: Urology;  Laterality: Left;  . CYSTOSCOPY WITH RETROGRADE PYELOGRAM, URETEROSCOPY AND STENT PLACEMENT Left 09/20/2014   Procedure: CYSTOSCOPY WITH RETROGRADE PYELOGRAM, URETEROSCOPY AND STENT PLACEMENT, stone removal;  Surgeon: Bernestine Amass, MD;  Location[facility];  Service: Urology;  Laterality: Left;  . DOPPLER ECHOCARDIOGRAPHY  2011  . ELBOW ARTHROSCOPY Right 1990's  . ENDARTERECTOMY Right 01/13/2016   Procedure: RIGHT CAROTID ARTERY ENDARTERECTOMY;  Surgeon: Mal Misty, MD;  Location: [place];  Service: Vascular;  Laterality: Right;  . ESOPHAGOGASTRODUODENOSCOPY N/A 02/15/2015   Procedure: ESOPHAGOGASTRODUODENOSCOPY (EGD);  Surgeon: Carol Ada, MD;  Location: [facility] ENDOSCOPY;  Service: Endoscopy;  Laterality: N/A;  LVAD  . EYE SURGERY     VITRECTOMY  LEFT 05/2012  . FEMORAL-POPLITEAL BYPASS GRAFT Right 2011  . FEMORAL-POPLITEAL BYPASS GRAFT Left 05/2002   Archie Endo 05/06/2002 (05/25/2013)  . HEEL SPUR SURGERY Right 1990's  . HOLMIUM LASER APPLICATION Left [id number]   Procedure: HOLMIUM LASER APPLICATION;  Surgeon: Bernestine Amass, MD;  [facility];  Service: Urology;  Laterality: Left;  . IMPLANTABLE  CARDIOVERTER DEFIBRILLATOR GENERATOR CHANGE N/A 05/25/2013   Procedure: IMPLANTABLE CARDIOVERTER DEFIBRILLATOR GENERATOR  CHANGE;  Surgeon: Deboraha Sprang, MD;  Location[facility] CATH LAB;  Service: Cardiovascular;  Laterality: N/A;  . INSERTION OF IMPLANTABLE LEFT VENTRICULAR ASSIST DEVICE N/A 10/19/2013   Procedure: INSERTION OF IMPLANTABLE LEFT VENTRICULAR ASSIST DEVICE;  Surgeon: Gaye Pollack, MD;  Location: [place];  Service: Open Heart Surgery;  Laterality: N/A;  CIRC ARREST  NITRIC OXIDE  MEDTRONIC ICD  . INTRAOPERATIVE TRANSESOPHAGEAL ECHOCARDIOGRAM N/A 10/19/2013   Procedure: INTRAOPERATIVE TRANSESOPHAGEAL ECHOCARDIOGRAM;  Surgeon: Gaye Pollack, MD;  Location: [facility] OR;  Service: Open Heart Surgery;  Laterality: N/A;  . KNEE ARTHROSCOPY Bilateral 1990's  . LEAD REVISION N/A 06/05/2013   Procedure: LEAD REVISION;  Surgeon: Thompson Grayer, MD;  Location: [facility] CATH LAB;  Service: Cardiovascular;  Laterality: N/A;  . LEFT HEART CATHETERIZATION WITH CORONARY ANGIOGRAM N/A 11/24/2012   Procedure: LEFT HEART CATHETERIZATION WITH CORONARY ANGIOGRAM;  Surgeon: Peter M Martinique, MD;  Location: [facility] CATH LAB;  Service: Cardiovascular;  Laterality: N/A;  . LIPOMA EXCISION Left 2013   "near ear" (05/25/2013)  . [place]; 2000's  . PATCH ANGIOPLASTY Right 01/13/2016   Procedure: WITH  PATCH ANGIOPLASTY;  Surgeon: Mal Misty, MD;  Location: [place];  Service: Vascular;  Laterality: Right;  . RENAL ARTERY BYPASS Bilateral 2003  . REVASCULARIZATION / IN-SITU GRAFT LEG    . RIGHT HEART CATHETERIZATION N/A 09/17/2013   Procedure: RIGHT HEART CATH;  Surgeon: Jolaine Artist, MD;  Location: [facility] CATH LAB;  Service: Cardiovascular;  Laterality: N/A;  . RIGHT HEART CATHETERIZATION N/A 10/14/2013   Procedure: RIGHT HEART CATH;  Surgeon: Jolaine Artist, MD;  Location: [facility] CATH LAB;  Service: Cardiovascular;  Laterality: N/A;  . RIGHT HEART CATHETERIZATION N/A 11/16/2013   Procedure: RIGHT HEART CATH;   Surgeon: Jolaine Artist, MD;  Location: [facility] CATH LAB;  Service: Cardiovascular;  Laterality: N/A;  . RIGHT HEART CATHETERIZATION N/A 07/13/2014   Procedure: RIGHT HEART CATH;  Surgeon: Jolaine Artist, MD;  Location: [facility] CATH LAB;  Service: Cardiovascular;  Laterality: N/A;  . TEE WITHOUT CARDIOVERSION N/A 11/04/2013   Procedure: TRANSESOPHAGEAL ECHOCARDIOGRAM (TEE);  Surgeon: Larey Dresser, MD;  Location: [place];  Service: Cardiovascular;  Laterality: N/A;  . TEE WITHOUT CARDIOVERSION N/A 03/02/2015   Procedure: TRANSESOPHAGEAL ECHOCARDIOGRAM (TEE);  Surgeon: Jolaine Artist, MD;  Location: [facility] ENDOSCOPY;  Service: Cardiovascular;  Laterality: N/A;  . TEE WITHOUT CARDIOVERSION N/A 01/19/2016   Procedure: TRANSESOPHAGEAL ECHOCARDIOGRAM (TEE);  Surgeon: Larey Dresser, MD;  Location: [place];  Service: Cardiovascular;  Laterality: N/A;  . TONSILLECTOMY  1946  . TUMOR EXCISION Left 1960's   "fatty tumor" (05/25/2013)  . V-TACH ABLATION N/A 09/12/2011   Procedure: V-TACH ABLATION;  Surgeon: Evans Lance, MD;  Location: [facility] CATH LAB;  Service: Cardiovascular;  Laterality: N/A;  . V-TACH ABLATION N/A 06/08/2013   Procedure: V-TACH ABLATION;  Surgeon: Evans Lance, MD;  Location: [facility] CATH LAB;  Service: Cardiovascular;  Laterality: N/A;    Family History: Family History  Problem Relation Age of Onset  . Heart attack Mother   . Coronary artery disease Mother   . Cancer Mother   . Heart disease Mother   . Hyperlipidemia Mother   . Hypertension Mother   . Coronary artery disease Father   . Stroke Father   . Cancer Father   . Heart disease Father     before age 52  . Hyperlipidemia Father   . Hypertension Father   . Heart attack  Father   . Coronary artery disease      Social History: Social History   Social History  . Marital status: Divorced    Spouse name: N/A  . Number of children: 2  . Years of education: N/A   Occupational History  . retired     [employer]  . 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 Smoking status: Former Smoker    Packs/day: 0.50    Years: 37.00    Types: Cigarettes    Quit date: 11/05/2001  . Smokeless tobacco: Never Used  . Alcohol use 0.0 oz/week     Comment: 05/25/2013 "mixed drink couple times/month"  . Drug use: No  . Sexual activity: No   Other Topics Concern  . None   Social History Narrative   Lives with sig other.    Allergies:  Allergies  Allergen Reactions  . Demerol [Meperidine] Other (See Comments)    Paralysis. Could only move eyes.    Objective:    Vital Signs:   Temp:  [99.4 F (37.4 C)] 99.4 F (37.4 C) (09/13 2215) Pulse Rate:  [113] 113 (09/13 2215) Resp:  [24] 24 (09/13 2215) SpO2:  [91 %] 91 % (09/13 2215) Weight:  [210 lb (95.3 kg)] 210 lb (95.3 kg) (09/13 2216)     Mean arterial Pressure 76  Physical Exam: General:  Weak appearing flushed HEENT: normal Neck: supple. JVP 8 cm. R CEA scar Carotids 2+ bilat; no bruits. No lymphadenopathy or thryomegaly appreciated. Cor: Mechanical heart sounds with LVAD hum present. Lungs: CTAB, normal effort Abdomen: soft, nontender, nondistended. No hepatosplenomegaly. No bruits or masses. Good bowel sounds.Back wound well healed Driveline: C/D/I; securement device intact and driveline incorporated Extremities: no cyanosis, clubbing, rash, 1+ edema   Neuro: alert & orientedx3, cranial nerves grossly intact. moves all 4 extremities w/o difficulty. Affect pleasant  Telemetry: Atrial tach vs sinus tach 100-110  ECG: sinus tach vs atach 110. QRS wide  Labs: Basic Metabolic Panel: No results for input(s): NA, K, CL, CO2, GLUCOSE, BUN, CREATININE, CALCIUM, MG, PHOS in the last 168 hours.  Liver Function Tests: No results for input(s): AST, ALT, ALKPHOS, BILITOT, PROT, ALBUMIN in the last 168 hours. No results for input(s): LIPASE, AMYLASE in the last 168  hours. No results for input(s): AMMONIA in the last 168 hours.  CBC: No results for input(s): WBC, NEUTROABS, HGB, HCT, MCV, PLT in the last 168 hours.  Cardiac Enzymes: No results for input(s): CKTOTAL, CKMB, CKMBINDEX, TROPONINI in the last 168 hours.  BNP: BNP (last 3 results)  Recent Labs  09/21/15 0021 09/22/15 1330 10/07/15 1125  BNP 638.3* 707.6* 491.6*    ProBNP (last 3 results) No results for input(s): PROBNP in the last 8760 hours.   CBG: No results for input(s): GLUCAP in the last 168 hours.  Coagulation Studies:  Recent Labs  07/18/16  INR 2.3    Other results:  Imaging: No results found.      Assessment:   1. Fever and productive cough -> acute bronchitis 2. 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 PAF/AFL with RVR s/p TEE dccv on 01/19/16 4. CKD stage III 5. 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 Now on Plavix and warfarin at home.  6. R internal carotid stenosis - s/p R CEA 01/13/16 - Had f/u 3/28. Stable. 7.  Lumbar compression fracture - s/p laminectomy 3/17 8. Wide QRSon ECG -? Hyperkalemia 9. Hypothryoidism.  Plan/Discussion:    He has high fever with productive sputum suspicious for CAP but CXR clear. Suspect acute bronchitis vs atypical PNA. Has been started on vanc/zosyn by ER for presumed sepsis but BP stable. Will continue overnight and narrow to levaquin in am  BCx and urine cx drawn. Will obtain sputum cx.   Hydrate gently. Hold diuretics and anti-HTN meds. Pharmacy to dose coumadin.  ECG with widened QRS. ? Hyperkalemia. Await labs.   VAD parameters stable.   Continue amio and ranexa for PAF.    I reviewed the LVAD parameters from today, and compared the results to the patient's prior recorded data.  No programming changes were made.  The LVAD is functioning within specified parameters.  The patient performs LVAD self-test daily.  LVAD interrogation was negative for any significant power changes, alarms or PI  events/speed drops.  LVAD equipment check completed and is in good working order.  Back-up equipment present.   LVAD education done on emergency procedures and precautions and reviewed exit site care.  Length of Stay: 0  Glori Bickers MD 07/18/2016, 11:09 PM  VAD Team Pager [phone] (7am - 7am) +++VAD ISSUES ONLY+++   Advanced Heart Failure Team Pager [phone] (M-F; [place])  Please contact [facility] for night-coverage after hours (4p -7a ) and weekends on [email or web address] for all non- LVAD Issues

## 2016-07-18 NOTE — Telephone Encounter (Signed)
Callback to [name], Braheem's SO. Reports she had to call EMS to get Kristiano up out of recliner. Started feeling poorly yesterday with congestion and cough. Has been taking Coricidin for symptoms without effect. He slept flat in bed as normal last night. Today with more SOB, fatigue, vomiting x 1. Sounds like he has taken in very little PO over last 2 days. Also c/o chest congestion and non-productive cough. Denies any CP, palpitations, orthopnea/PND.   Reports VAD parameters have been all over the place over last few weeks. Is scheduled for RAMP Echo as he was having some (---) flows and PIs < 3 about a month ago.   Rapid Response RN notified of arrival via EMS. Dr. Haroldine Laws updated with the above. He will go to ED to assess Iona Beard.   Janene Madeira, RN VAD Coordinator   Office: [phone] 24/7 Emergency VAD Pager: [phone]

## 2016-07-19 ENCOUNTER — Encounter (HOSPITAL_COMMUNITY): Payer: Self-pay

## 2016-07-19 ENCOUNTER — Encounter (HOSPITAL_COMMUNITY)
Admission: EM | Disposition: A | Discharge status: Home / Self Care | Payer: Self-pay | Source: Home / Self Care | Attending: Internal Medicine

## 2016-07-19 DIAGNOSIS — I480 Paroxysmal atrial fibrillation: Secondary | ICD-10-CM | POA: Diagnosis present

## 2016-07-19 DIAGNOSIS — I252 Old myocardial infarction: Secondary | ICD-10-CM | POA: Diagnosis not present

## 2016-07-19 DIAGNOSIS — Z8673 Personal history of transient ischemic attack (TIA), and cerebral infarction without residual deficits: Secondary | ICD-10-CM | POA: Diagnosis not present

## 2016-07-19 DIAGNOSIS — E860 Dehydration: Secondary | ICD-10-CM | POA: Diagnosis present

## 2016-07-19 DIAGNOSIS — Z79899 Other long term (current) drug therapy: Secondary | ICD-10-CM | POA: Diagnosis not present

## 2016-07-19 DIAGNOSIS — Z87891 Personal history of nicotine dependence: Secondary | ICD-10-CM | POA: Diagnosis not present

## 2016-07-19 DIAGNOSIS — I5022 Chronic systolic (congestive) heart failure: Secondary | ICD-10-CM | POA: Diagnosis present

## 2016-07-19 DIAGNOSIS — Z95811 Presence of heart assist device: Secondary | ICD-10-CM | POA: Diagnosis not present

## 2016-07-19 DIAGNOSIS — Z885 Allergy status to narcotic agent status: Secondary | ICD-10-CM | POA: Diagnosis not present

## 2016-07-19 DIAGNOSIS — Z823 Family history of stroke: Secondary | ICD-10-CM | POA: Diagnosis not present

## 2016-07-19 DIAGNOSIS — I6521 Occlusion and stenosis of right carotid artery: Secondary | ICD-10-CM | POA: Diagnosis present

## 2016-07-19 DIAGNOSIS — J209 Acute bronchitis, unspecified: Secondary | ICD-10-CM | POA: Diagnosis present

## 2016-07-19 DIAGNOSIS — Z7902 Long term (current) use of antithrombotics/antiplatelets: Secondary | ICD-10-CM | POA: Diagnosis not present

## 2016-07-19 DIAGNOSIS — E875 Hyperkalemia: Secondary | ICD-10-CM | POA: Diagnosis present

## 2016-07-19 DIAGNOSIS — I484 Atypical atrial flutter: Secondary | ICD-10-CM | POA: Diagnosis not present

## 2016-07-19 DIAGNOSIS — Z23 Encounter for immunization: Secondary | ICD-10-CM | POA: Diagnosis not present

## 2016-07-19 DIAGNOSIS — I130 Hypertensive heart and chronic kidney disease with heart failure and stage 1 through stage 4 chronic kidney disease, or unspecified chronic kidney disease: Secondary | ICD-10-CM | POA: Diagnosis present

## 2016-07-19 DIAGNOSIS — Z8249 Family history of ischemic heart disease and other diseases of the circulatory system: Secondary | ICD-10-CM | POA: Diagnosis not present

## 2016-07-19 DIAGNOSIS — I472 Ventricular tachycardia: Secondary | ICD-10-CM | POA: Diagnosis present

## 2016-07-19 DIAGNOSIS — Z87898 Personal history of other specified conditions: Secondary | ICD-10-CM | POA: Diagnosis not present

## 2016-07-19 DIAGNOSIS — Z955 Presence of coronary angioplasty implant and graft: Secondary | ICD-10-CM | POA: Diagnosis not present

## 2016-07-19 DIAGNOSIS — I5023 Acute on chronic systolic (congestive) heart failure: Secondary | ICD-10-CM | POA: Diagnosis not present

## 2016-07-19 DIAGNOSIS — Z7901 Long term (current) use of anticoagulants: Secondary | ICD-10-CM | POA: Diagnosis not present

## 2016-07-19 DIAGNOSIS — R0602 Shortness of breath: Secondary | ICD-10-CM | POA: Diagnosis present

## 2016-07-19 DIAGNOSIS — I4892 Unspecified atrial flutter: Secondary | ICD-10-CM | POA: Diagnosis present

## 2016-07-19 DIAGNOSIS — N183 Chronic kidney disease, stage 3 (moderate): Secondary | ICD-10-CM | POA: Diagnosis present

## 2016-07-19 DIAGNOSIS — E039 Hypothyroidism, unspecified: Secondary | ICD-10-CM | POA: Diagnosis present

## 2016-07-19 DIAGNOSIS — M4856XD Collapsed vertebra, not elsewhere classified, lumbar region, subsequent encounter for fracture with routine healing: Secondary | ICD-10-CM | POA: Diagnosis present

## 2016-07-19 HISTORY — PX: ELECTROPHYSIOLOGIC STUDY: SHX172A

## 2016-07-19 LAB — URINALYSIS, ROUTINE W REFLEX MICROSCOPIC
Bilirubin Urine: NEGATIVE
Glucose, UA: NEGATIVE mg/dL
Ketones, ur: 15 mg/dL — AB
Nitrite: NEGATIVE
Protein, ur: 30 mg/dL — AB
Specific Gravity, Urine: 1.023 (ref 1.005–1.030)
pH: 5 (ref 5.0–8.0)

## 2016-07-19 LAB — CBC WITH DIFFERENTIAL/PLATELET
Basophils Absolute: 0 10*3/uL (ref 0.0–0.1)
Basophils Relative: 0 %
Eosinophils Absolute: 0 10*3/uL (ref 0.0–0.7)
Eosinophils Relative: 0 %
HCT: 36.1 % — ABNORMAL LOW (ref 39.0–52.0)
Hemoglobin: 11.4 g/dL — ABNORMAL LOW (ref 13.0–17.0)
Lymphocytes Relative: 5 %
Lymphs Abs: 0.6 10*3/uL — ABNORMAL LOW (ref 0.7–4.0)
MCH: 31.4 pg (ref 26.0–34.0)
MCHC: 31.6 g/dL (ref 30.0–36.0)
MCV: 99.4 fL (ref 78.0–100.0)
Monocytes Absolute: 1.6 10*3/uL — ABNORMAL HIGH (ref 0.1–1.0)
Monocytes Relative: 12 %
Neutro Abs: 11.4 10*3/uL — ABNORMAL HIGH (ref 1.7–7.7)
Neutrophils Relative %: 83 %
Platelets: 129 10*3/uL — ABNORMAL LOW (ref 150–400)
RBC: 3.63 MIL/uL — ABNORMAL LOW (ref 4.22–5.81)
RDW: 15.5 % (ref 11.5–15.5)
WBC: 13.6 10*3/uL — ABNORMAL HIGH (ref 4.0–10.5)

## 2016-07-19 LAB — EXPECTORATED SPUTUM ASSESSMENT W GRAM STAIN, RFLX TO RESP C

## 2016-07-19 LAB — BASIC METABOLIC PANEL
Anion gap: 11 (ref 5–15)
BUN: 31 mg/dL — ABNORMAL HIGH (ref 6–20)
CO2: 20 mmol/L — ABNORMAL LOW (ref 22–32)
Calcium: 9.2 mg/dL (ref 8.9–10.3)
Chloride: 107 mmol/L (ref 101–111)
Creatinine, Ser: 1.87 mg/dL — ABNORMAL HIGH (ref 0.61–1.24)
GFR calc Af Amer: 39 mL/min — ABNORMAL LOW (ref >60)
GFR calc non Af Amer: 33 mL/min — ABNORMAL LOW (ref >60)
Glucose, Bld: 138 mg/dL — ABNORMAL HIGH (ref 65–99)
Potassium: 4.3 mmol/L (ref 3.5–5.1)
Sodium: 138 mmol/L (ref 135–145)

## 2016-07-19 LAB — EXPECTORATED SPUTUM ASSESSMENT W REFEX TO RESP CULTURE

## 2016-07-19 LAB — COMPREHENSIVE METABOLIC PANEL
ALBUMIN: 3.6 g/dL (ref 3.5–5.0)
ALK PHOS: 91 U/L (ref 38–126)
ALT: 25 U/L (ref 17–63)
ANION GAP: 6 (ref 5–15)
AST: 53 U/L — ABNORMAL HIGH (ref 15–41)
BILIRUBIN TOTAL: 1 mg/dL (ref 0.3–1.2)
BUN: 32 mg/dL — AB (ref 6–20)
CALCIUM: 8.7 mg/dL — AB (ref 8.9–10.3)
CO2: 23 mmol/L (ref 22–32)
Chloride: 109 mmol/L (ref 101–111)
Creatinine, Ser: 1.78 mg/dL — ABNORMAL HIGH (ref 0.61–1.24)
GFR calc Af Amer: 41 mL/min — ABNORMAL LOW (ref >60)
GFR calc non Af Amer: 35 mL/min — ABNORMAL LOW (ref >60)
GLUCOSE: 138 mg/dL — AB (ref 65–99)
Potassium: 4 mmol/L (ref 3.5–5.1)
Sodium: 138 mmol/L (ref 135–145)
TOTAL PROTEIN: 6.1 g/dL — AB (ref 6.5–8.1)

## 2016-07-19 LAB — URINE MICROSCOPIC-ADD ON

## 2016-07-19 LAB — PROTIME-INR
INR: 2.38
Prothrombin Time: 26.4 seconds — ABNORMAL HIGH (ref 11.4–15.2)

## 2016-07-19 LAB — LACTATE DEHYDROGENASE
LDH: 296 U/L — ABNORMAL HIGH (ref 98–192)
LDH: 256 U/L — AB (ref 98–192)

## 2016-07-19 LAB — MRSA PCR SCREENING: MRSA by PCR: NEGATIVE

## 2016-07-19 LAB — BRAIN NATRIURETIC PEPTIDE: B Natriuretic Peptide: 1289.7 pg/mL — ABNORMAL HIGH (ref 0.0–100.0)

## 2016-07-19 SURGERY — CARDIOVERSION (CATH LAB)

## 2016-07-19 SURGERY — CARDIOVERSION
Anesthesia: Monitor Anesthesia Care

## 2016-07-19 MED ORDER — INFLUENZA VAC SPLIT QUAD 0.5 ML IM SUSY
0.5000 mL | PREFILLED_SYRINGE | INTRAMUSCULAR | Status: AC
  Administered 2016-07-20: 0.5 mL via INTRAMUSCULAR
  Filled 2016-07-19: qty 0.5

## 2016-07-19 MED ORDER — FUROSEMIDE 10 MG/ML IJ SOLN
40.0000 mg | Freq: Once | INTRAMUSCULAR | Status: AC
  Administered 2016-07-19: 40 mg via INTRAVENOUS
  Filled 2016-07-19: qty 4

## 2016-07-19 MED ORDER — DOCUSATE SODIUM 100 MG PO CAPS
100.0000 mg | ORAL_CAPSULE | Freq: Every day | ORAL | Status: DC
  Administered 2016-07-19 – 2016-07-23 (×5): 100 mg via ORAL
  Filled 2016-07-19 (×5): qty 1

## 2016-07-19 MED ORDER — LEVOTHYROXINE SODIUM 100 MCG PO TABS
200.0000 ug | ORAL_TABLET | ORAL | Status: DC
  Administered 2016-07-19 – 2016-07-22 (×4): 200 ug via ORAL
  Filled 2016-07-19 (×3): qty 2

## 2016-07-19 MED ORDER — LEVOTHYROXINE SODIUM 100 MCG PO TABS
300.0000 ug | ORAL_TABLET | ORAL | Status: DC
  Administered 2016-07-23: 300 ug via ORAL
  Filled 2016-07-19 (×2): qty 3

## 2016-07-19 MED ORDER — WARFARIN SODIUM 2 MG PO TABS
4.0000 mg | ORAL_TABLET | ORAL | Status: DC
  Administered 2016-07-19: 4 mg via ORAL
  Filled 2016-07-19: qty 2

## 2016-07-19 MED ORDER — RANOLAZINE ER 500 MG PO TB12
500.0000 mg | ORAL_TABLET | Freq: Two times a day (BID) | ORAL | Status: DC
  Administered 2016-07-19 – 2016-07-23 (×9): 500 mg via ORAL
  Filled 2016-07-19 (×9): qty 1

## 2016-07-19 MED ORDER — ATORVASTATIN CALCIUM 80 MG PO TABS
80.0000 mg | ORAL_TABLET | Freq: Every day | ORAL | Status: DC
  Administered 2016-07-19 – 2016-07-23 (×5): 80 mg via ORAL
  Filled 2016-07-19 (×5): qty 1
  Filled 2016-07-19: qty 2

## 2016-07-19 MED ORDER — ACETAMINOPHEN 325 MG PO TABS
650.0000 mg | ORAL_TABLET | ORAL | Status: DC | PRN
  Administered 2016-07-20 – 2016-07-22 (×4): 650 mg via ORAL
  Filled 2016-07-19 (×4): qty 2

## 2016-07-19 MED ORDER — FENTANYL CITRATE (PF) 100 MCG/2ML IJ SOLN
INTRAMUSCULAR | Status: AC
  Filled 2016-07-19: qty 2

## 2016-07-19 MED ORDER — MIDAZOLAM HCL 5 MG/5ML IJ SOLN
INTRAMUSCULAR | Status: DC | PRN
  Administered 2016-07-19 (×3): 1 mg via INTRAVENOUS

## 2016-07-19 MED ORDER — VANCOMYCIN HCL IN DEXTROSE 1-5 GM/200ML-% IV SOLN
1000.0000 mg | Freq: Once | INTRAVENOUS | Status: AC
  Administered 2016-07-19: 1000 mg via INTRAVENOUS
  Filled 2016-07-19: qty 200

## 2016-07-19 MED ORDER — BENZONATATE 100 MG PO CAPS
100.0000 mg | ORAL_CAPSULE | Freq: Three times a day (TID) | ORAL | Status: DC | PRN
  Administered 2016-07-19 – 2016-07-22 (×7): 100 mg via ORAL
  Filled 2016-07-19 (×7): qty 1

## 2016-07-19 MED ORDER — AMIODARONE HCL IN DEXTROSE 360-4.14 MG/200ML-% IV SOLN
30.0000 mg/h | INTRAVENOUS | Status: DC
  Administered 2016-07-20: 30 mg/h via INTRAVENOUS
  Administered 2016-07-20: 60 mg/h via INTRAVENOUS
  Administered 2016-07-20 – 2016-07-21 (×3): 30 mg/h via INTRAVENOUS
  Administered 2016-07-21 (×2): 60 mg/h via INTRAVENOUS
  Administered 2016-07-21: 30 mg/h via INTRAVENOUS
  Filled 2016-07-19 (×10): qty 200

## 2016-07-19 MED ORDER — LEVOFLOXACIN IN D5W 500 MG/100ML IV SOLN
500.0000 mg | INTRAVENOUS | Status: DC
  Administered 2016-07-19 – 2016-07-23 (×4): 500 mg via INTRAVENOUS
  Filled 2016-07-19 (×4): qty 100

## 2016-07-19 MED ORDER — FENTANYL CITRATE (PF) 100 MCG/2ML IJ SOLN
INTRAMUSCULAR | Status: DC | PRN
  Administered 2016-07-19: 12.5 ug via INTRAVENOUS
  Administered 2016-07-19: 25 ug via INTRAVENOUS

## 2016-07-19 MED ORDER — WARFARIN - PHARMACIST DOSING INPATIENT: Freq: Every day | Status: DC

## 2016-07-19 MED ORDER — SODIUM CHLORIDE 0.9% FLUSH: 3.0000 mL | INTRAVENOUS | Status: DC | PRN

## 2016-07-19 MED ORDER — CLOPIDOGREL BISULFATE 75 MG PO TABS
75.0000 mg | ORAL_TABLET | Freq: Every day | ORAL | Status: DC
  Administered 2016-07-19 – 2016-07-23 (×5): 75 mg via ORAL
  Filled 2016-07-19 (×5): qty 1

## 2016-07-19 MED ORDER — LEVOTHYROXINE SODIUM 100 MCG PO TABS: 200.0000 ug | ORAL_TABLET | Freq: Every day | ORAL | Status: DC

## 2016-07-19 MED ORDER — WARFARIN SODIUM 2 MG PO TABS
2.0000 mg | ORAL_TABLET | ORAL | Status: DC
  Administered 2016-07-19: 2 mg via ORAL
  Filled 2016-07-19: qty 1

## 2016-07-19 MED ORDER — AMIODARONE LOAD VIA INFUSION
150.0000 mg | Freq: Once | INTRAVENOUS | Status: AC
  Administered 2016-07-19: 150 mg via INTRAVENOUS
  Filled 2016-07-19: qty 83.34

## 2016-07-19 MED ORDER — ONDANSETRON HCL 4 MG/2ML IJ SOLN
4.0000 mg | Freq: Four times a day (QID) | INTRAMUSCULAR | Status: DC | PRN
  Administered 2016-07-21 (×2): 4 mg via INTRAVENOUS
  Filled 2016-07-19 (×2): qty 2

## 2016-07-19 MED ORDER — LEVOFLOXACIN IN D5W 500 MG/100ML IV SOLN
500.0000 mg | Freq: Once | INTRAVENOUS | Status: AC
  Administered 2016-07-19: 500 mg via INTRAVENOUS
  Filled 2016-07-19: qty 100

## 2016-07-19 MED ORDER — AMIODARONE HCL 100 MG PO TABS
100.0000 mg | ORAL_TABLET | Freq: Every day | ORAL | Status: DC
  Administered 2016-07-19: 100 mg via ORAL
  Filled 2016-07-19 (×2): qty 1

## 2016-07-19 MED ORDER — SODIUM CHLORIDE 0.9 % IV SOLN: 250.0000 mL | INTRAVENOUS | Status: DC | PRN

## 2016-07-19 MED ORDER — AMIODARONE HCL IN DEXTROSE 360-4.14 MG/200ML-% IV SOLN
60.0000 mg/h | INTRAVENOUS | Status: AC
  Administered 2016-07-19 (×2): 60 mg/h via INTRAVENOUS

## 2016-07-19 MED ORDER — PANTOPRAZOLE SODIUM 40 MG PO TBEC
40.0000 mg | DELAYED_RELEASE_TABLET | Freq: Two times a day (BID) | ORAL | Status: DC
  Administered 2016-07-19 – 2016-07-23 (×9): 40 mg via ORAL
  Filled 2016-07-19 (×9): qty 1

## 2016-07-19 MED ORDER — SODIUM CHLORIDE 0.9% FLUSH
3.0000 mL | Freq: Two times a day (BID) | INTRAVENOUS | Status: DC
  Administered 2016-07-20 – 2016-07-23 (×7): 3 mL via INTRAVENOUS

## 2016-07-19 MED ORDER — MIDAZOLAM HCL 5 MG/5ML IJ SOLN
INTRAMUSCULAR | Status: AC
  Filled 2016-07-19: qty 5

## 2016-07-19 MED ORDER — SODIUM CHLORIDE 0.9 % IV BOLUS (SEPSIS)
500.0000 mL | Freq: Once | INTRAVENOUS | Status: AC
  Administered 2016-07-19: 500 mL via INTRAVENOUS

## 2016-07-19 SURGICAL SUPPLY — 1 items: PAD DEFIB LIFELINK (PAD) ×2 IMPLANT

## 2016-07-19 NOTE — Progress Notes (Signed)
Admitted 07/18/16 via EMS  to ED due to high fever and productive cough.   HeartMate II LVAD implanted on 10/09/13 by [facility].   Vital signs: HR: 124-AFIB Doppler MAP: 82 Automated BP: not done O2 Sat: 98  Wt:  205 lbs   LVAD interrogation reveals:  Speed: 9000 Flow: 3.9 Power: 4.7  PI: 4.9 Alarms: none Events:  rare Fixed speed:9000  Low speed limit: 8400  Drive Line: CDI. Will need to be changed on Sunday 07/22/16.  Labs:   LDH trend: 256  INR trend: 2.38  WBC: 13.6  BNP: 1289  Plan/Recommendations:   1. Pt is afib 120-140s. Will require cardioversion later today.  2. Nurse-please get a automatic blood pressure to compare with the doppler pressure every shift-this is best practice.   Tanda Rockers RN, BSN VAD Coordinator 24/7 VAD pager [phone]

## 2016-07-19 NOTE — Progress Notes (Signed)
ANTICOAGULATION CONSULT NOTE - Initial Consult  Pharmacy Consult for Coumadin Indication: VAD  Allergies  Allergen Reactions  . Demerol [Meperidine] Other (See Comments)    Paralysis. Could only move eyes.    Patient Measurements: Height: 6' (182.9 cm) Weight: 210 lb (95.3 kg) IBW/kg (Calculated) : 77.6  Vital Signs: Temp: 103 F (39.4 C) (09/13 2347) Temp Source: Rectal (09/13 2347) Pulse Rate: 112 (09/14 0015)  Labs:  Recent Labs  07/18/16 07/18/16 2247  HGB  --  11.4*  HCT  --  36.1*  PLT  --  129*  INR 2.3  --   CREATININE  --  1.87*    Estimated Creatinine Clearance: 40.3 mL/min (by C-G formula based on SCr of 1.87 mg/dL (H)).   Medical History: Past Medical History:  Diagnosis Date  . Arthritis   . Automatic implantable cardioverter-defibrillator in situ    a. s/p [employer] Evera device serial [id number]    . Bradycardia   . CAD (coronary artery disease)    a. s/p MI 1982;  s/p DES to CFX 2011;  s/p NSTEMI 4/12 in setting of VTach;  cath 7/12:  LM ok, LAD mid 30-40%, Dx's with 40%, mCFX stent patent, prox to mid RCA occluded with R to R and L to R collats.  Medical Tx was continued  . Chronic systolic heart failure ([place])    a. s/p LVAD 10/2013 for destination therapy  . CKD (chronic kidney disease)   . CVD (cerebrovascular disease)    a. s/p L CEA 2008  . Cytopenia   . Dysrhythmia    AFIB  . History of GI bleed    a. on ASA- started on plavix during 10/2015 admission for CVA  . HLD (hyperlipidemia)   . HTN (hypertension)   . Hypothyroidism   . Inappropriate therapy from implantable cardioverter-defibrillator    a. ATP for sinus tach SVT wavelet identified SVT but was no  passive (2014)  . Ischemic cardiomyopathy   . 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 L Ear   . Myocardial infarction ([place])    1992  . PAF (paroxysmal atrial fibrillation) ([place])   . PVD (peripheral vascular disease) ([place])    a. 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 Shortness of breath dyspnea    W/ EXERTION   . Sleep apnea    NO CPAP NOW  HE SENT IT BACK YRS AGO  . Stroke [facility])    a. 10/2015 admission   . Ventricular tachycardia ([place])    a. s/p AICD;   h/o ICD shock;  Amiodarone Rx. S/P ablation in 2012    Medications:  Prescriptions Prior to Admission  Medication Sig Dispense Refill Last Dose  . amiodarone (PACERONE) 200 MG tablet Take 0.5 tablets (100 mg total) by mouth daily. 30 tablet 6 Taking  . amLODipine (NORVASC) 10 MG tablet Take 1 tablet (10 mg total) by mouth at bedtime. 30 tablet 6   . atorvastatin (LIPITOR) 80 MG tablet Take 80 mg by mouth daily.    Taking  . clopidogrel (PLAVIX) 75 MG tablet Take 1 tablet (75 mg total) by mouth daily. 90 tablet 3 Taking  . docusate sodium (COLACE) 100 MG capsule Take 100 mg by mouth daily.    Taking  . furosemide (LASIX) 20 MG tablet Take 1 tablet (20 mg total) by  mouth as needed for edema (weight gain). 30 tablet 3 Taking  . levothyroxine (SYNTHROID, LEVOTHROID) 200 MCG tablet Take one 200 mcg daily before breakfast every day with 1 1/2 (300 mcg) every Monday. (Patient taking differently: Take 200-300 mcg by mouth daily before breakfast. Take 1 tablet (268mcg) all days except on Mondays take 1&1/2 tablets (377mcg)) 45 tablet 6 Taking  . metoprolol succinate (TOPROL-XL) 25 MG 24 hr tablet Take 1 tablet (25 mg total) by mouth 2 (two) times daily. 180 tablet 3 Taking  . Multiple Vitamins-Minerals (ICAPS PO) Take 1 tablet by mouth 2 (two) times daily.   Taking  . pantoprazole (PROTONIX) 40 MG tablet Take 1 tablet (40 mg total) by mouth 2 (two) times daily. 60 tablet 6 Taking  . ranolazine (RANEXA) 500 MG 12 hr tablet TAKE 1 TABLET (500 MG TOTAL) BY MOUTH 2 (TWO) TIMES DAILY. 60 tablet 6 Taking  . warfarin (COUMADIN) 2 MG tablet Take 2-4 mg by mouth daily. Take 1 tablet (2mg ) all days except on Monday take 2 tablets (4mg )   Taking  .  zinc gluconate 50 MG tablet Take 50 mg by mouth 2 (two) times daily.    Taking   Scheduled:  . amiodarone  100 mg Oral Daily  . atorvastatin  80 mg Oral Daily  . clopidogrel  75 mg Oral Daily  . docusate sodium  100 mg Oral Daily  . levofloxacin (LEVAQUIN) IV  500 mg Intravenous Once  . [START ON 07/20/2016] levofloxacin (LEVAQUIN) IV  500 mg Intravenous Q24H  . levothyroxine  200-300 mcg Oral QAC breakfast  . pantoprazole  40 mg Oral BID  . ranolazine  500 mg Oral BID  . sodium chloride  1,000 mL Intravenous Once  . vancomycin  1,000 mg Intravenous Once    Assessment: 75yo male admitted w/ bronchitis to continue Coumadin for VAD; noted low goal; current INR at goal (had anticoag visit yesterday) w/ last dose taken 9/12.  Goal of Therapy:  INR 2-2.5   Plan:  Will continue home Coumadin dose of 4mg  SMWFS and 2mg  TT and monitor INR.  Wynona Neat, PharmD, BCPS  07/19/2016,1:02 AM

## 2016-07-19 NOTE — Progress Notes (Addendum)
HeartMate 2 Rounding Note  Subjective:    Admitted 07/18/16 with high fever and productive cough.  ? CAP but CXR clear. Though most likely acute bronchitis vs atypical PNA. Started on Vanc and given dose of Zosyn in ED.  Now narrowed to Vanc/Levaquin.     BCx and UCx pending. Sputum culture to be collected.   Feeling Ok this morning. No fever this morning.   Tmax 103 overnight.    LDH 256 INR 2.38 WBC 13.6  LVAD INTERROGATION:  Flow 3.5 liters/min, speed 9000, power 4.4, PI 5.1.   Objective:    Vital Signs:   Temp:  [97.3 F (36.3 C)-103 F (39.4 C)] 97.9 F (36.6 C) (09/14 0717) Pulse Rate:  [112-123] 123 (09/14 0800) Resp:  [11-24] 16 (09/14 0800) SpO2:  [91 %-99 %] 96 % (09/14 0800) Weight:  [205 lb 11 oz (93.3 kg)-210 lb (95.3 kg)] 205 lb 11 oz (93.3 kg) (09/14 0533)   Mean arterial Pressure 60-70s  Intake/Output:   Intake/Output Summary (Last 24 hours) at 07/19/16 0818 Last data filed at 07/19/16 0211  Gross per 24 hour  Intake              100 ml  Output                0 ml  Net              100 ml     Physical Exam: General:  Weak appearing, flushed.  HEENT: normal Neck: supple. JVP 8-9 cm. R CEA scar Carotids 2+ bilat; no bruits. No thyromegaly or nodule noted.  Cor: Mechanical heart sounds with LVAD hum present. Lungs: Scant crackles Abdomen: soft, NT, ND, no HSM. No bruits or masses. +BS  Driveline: C/D/I; securement device intact and driveline incorporated Extremities: no cyanosis, clubbing, rash, trace to 1+ edema R ankle edema Neuro: alert & orientedx3, cranial nerves grossly intact. moves all 4 extremities w/o difficulty. Affect pleasant  Telemetry: Reviewed, A tach vs sinus tach 110s.  Labs: Basic Metabolic Panel:  Recent Labs Lab 07/18/16 2247 07/19/16 0422  NA 138 138  K 4.3 4.0  CL 107 109  CO2 20* 23  GLUCOSE 138* 138*  BUN 31* 32*  CREATININE 1.87* 1.78*  CALCIUM 9.2 8.7*    Liver Function Tests:  Recent Labs Lab  07/19/16 0422  AST 53*  ALT 25  ALKPHOS 91  BILITOT 1.0  PROT 6.1*  ALBUMIN 3.6   No results for input(s): LIPASE, AMYLASE in the last 168 hours. No results for input(s): AMMONIA in the last 168 hours.  CBC:  Recent Labs Lab 07/18/16 2247  WBC 13.6*  NEUTROABS 11.4*  HGB 11.4*  HCT 36.1*  MCV 99.4  PLT 129*    INR:  Recent Labs Lab 07/18/16 07/19/16 0422  INR 2.3 2.38    Other results:  EKG:   Imaging: Dg Chest Portable 1 View  Result Date: 07/18/2016 CLINICAL DATA:  Cough, shortness of breath, fever for 2 days. Tachycardia. EXAM: PORTABLE CHEST 1 VIEW COMPARISON:  Most recent chest imaging radiographs 02/04/2016 FINDINGS: Patient is post median sternotomy. Left ventricular assist device again seen. Left-sided pacemaker in place. There is stable cardiomegaly and tortuosity of the thoracic aorta. Improved interstitial opacities from prior, likely resolved edema. Mild bibasilar atelectasis. No large pleural effusion. No focal airspace disease. IMPRESSION: Improved pulmonary edema from prior. Stable postsurgical change and cardiomegaly. No evidence of pneumonia. Electronically Signed   By: Fonnie Birkenhead.D.  On: 07/18/2016 23:39      Medications:     Scheduled Medications: . amiodarone  100 mg Oral Daily  . atorvastatin  80 mg Oral Daily  . clopidogrel  75 mg Oral Daily  . docusate sodium  100 mg Oral Daily  . [START ON 07/20/2016] levofloxacin (LEVAQUIN) IV  500 mg Intravenous Q24H  . [START ON 07/23/2016] levothyroxine  300 mcg Oral Q Mon   And  . levothyroxine  200 mcg Oral Once per day on Sun Tue Wed Thu Fri Sat  . pantoprazole  40 mg Oral BID  . ranolazine  500 mg Oral BID  . warfarin  2 mg Oral Once per day on Tue Thu  . warfarin  4 mg Oral Once per day on Sun Mon Wed Fri Sat  . Warfarin - Pharmacist Dosing Inpatient   Does not apply q1800     Infusions:     PRN Medications:  acetaminophen   Assessment:   1. Fever and productive cough  -> acute bronchitis 2. 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 PAF/AFL with RVR s/p TEE dccv on 01/19/16 4. CKD stage III 5. 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 Now on Plavix and warfarin at home.  6. R internal carotid stenosis - s/p R CEA 01/13/16 - Had f/u 3/28. Stable. 7. Lumbar compression fracture - s/p laminectomy 3/17 8. Wide QRS on ECG -? Hyperkalemia 9. Hypothryoidism.  Plan/Discussion:    Admitted with high fever. CXR clear. Acute bronchitis vs atypical PNA.  Got dose of Zosyn. Remains on Vancomycin and now on Levaquin.   MAPs in 60-70s. Hydrating gently.  Anti-HTN and diuretics on hold.  INR 2.38. Dosing per pharmacy.   I reviewed the LVAD parameters from today, and compared the results to the patient's prior recorded data.  No programming changes were made.  The LVAD is functioning within specified parameters.  The patient performs LVAD self-test daily.  LVAD interrogation was negative for any significant power changes, alarms or PI events/speed drops.  LVAD equipment check completed and is in good working order.  Back-up equipment present.   LVAD education done on emergency procedures and precautions and reviewed exit site care.  Length of Stay: 0  Annamaria Helling 07/19/2016, 8:18 AM  VAD Team --- VAD ISSUES ONLY--- Pager [phone] (7am - 7am)  Advanced Heart Failure Team  Pager [phone] (M-F; 7a - 4p)  Please contact [facility] for night-coverage after hours (4p -7a ) and weekends on [email or web address]  Patient seen and examined with Oda Kilts, PA-C. We discussed all aspects of the encounter. I agree with the assessment and plan as stated above.   Feels slightly better this am. Afebrile. However still tachycardic. Unclear if sinus tach but I am concerned he may have his atrial tachycardia back (vs VT) - will interrogate device. BP low. Will hydrate gently. Can treat with single agent Levaquin. Swill with blood in urine but LDH ok. No  flank pain. Low threshold to image pelvis.  VAD parameters stable.   Bensimhon, Daniel,MD 8:49 AM

## 2016-07-19 NOTE — ED Provider Notes (Signed)
[facility] DEPT Provider Note   CSN: [id number] Arrival date & time: 07/18/16  2207     History   Chief Complaint Chief Complaint  Patient presents with  . Shortness of Breath  . Weakness    HPI MICHIEL FUGETT is a 75 y.o. male.  HPI   [id number] with LVAD presenting with generalized weakness/fatigue. Symptom onset about 1.5 days ago. Progressive since then. No energy. Dyspnea and occasional cough.No productive. No appetite. Hasn't been drinking much. Subjective fever. No v/d. No urinary complaints.   Past Medical History:  Diagnosis Date  . Arthritis   . Automatic implantable cardioverter-defibrillator in situ    a. s/p Medtronic Evera device serial [id number]    . Bradycardia   . CAD (coronary artery disease)    a. s/p MI 1982;  s/p DES to CFX 2011;  s/p NSTEMI 4/12 in setting of VTach;  cath 7/12:  LM ok, LAD mid 30-40%, Dx's with 40%, mCFX stent patent, prox to mid RCA occluded with R to R and L to R collats.  Medical Tx was continued  . Chronic systolic heart failure ([place])    a. s/p LVAD 10/2013 for destination therapy  . CKD (chronic kidney disease)   . CVD (cerebrovascular disease)    a. s/p L CEA 2008  . Cytopenia   . Dysrhythmia    AFIB  . History of GI bleed    a. on ASA- started on plavix during 10/2015 admission for CVA  . HLD (hyperlipidemia)   . HTN (hypertension)   . Hypothyroidism   . Inappropriate therapy from implantable cardioverter-defibrillator    a. ATP for sinus tach SVT wavelet identified SVT but was no  passive (2014)  . Ischemic cardiomyopathy   . 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 L Ear   . Myocardial infarction ([place])    1992  . PAF (paroxysmal atrial fibrillation) ([place])   . PVD (peripheral vascular disease) ([place])    a. 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 Shortness of breath dyspnea    W/ EXERTION   . Sleep apnea    NO CPAP NOW  HE SENT IT  BACK YRS AGO  . Stroke [facility])    a. 10/2015 admission   . Ventricular tachycardia ([place])    a. s/p AICD;   h/o ICD shock;  Amiodarone Rx. S/P ablation in 2012    Patient Active Problem List   Diagnosis Date Noted  . Open wound of lumbar region without complication [id number]  . Chronic diastolic heart failure ([place])   . Spinal compression fracture ([place]) 02/03/2016  . Subtherapeutic international normalized ratio (INR) 02/01/2016  . Carotid stenosis, asymptomatic 01/31/2016  . Carotid stenosis, symptomatic w/o infarct 01/31/2016  . Carotid artery calcification 01/11/2016  . Symptomatic stenosis of right carotid artery 01/09/2016  . History of GI bleed   . PAF (paroxysmal atrial fibrillation) ([place])   . CKD (chronic kidney disease)   . Hypothyroidism   . CVA (cerebral infarction)   . Cardiomyopathy, ischemic   . Carotid stenosis   . History of CEA (carotid endarterectomy)   . Chronic anticoagulation   . Left ventricular assist device (LVAD) complication 123XX123  . Jerking movements of extremities 07/14/2015  . Fall 06/14/2015  . Cough 06/01/2015  . Nausea & vomiting 02/28/2015  . Near syncope   .  Chronic systolic CHF (congestive heart failure) ([place])   . Atrial tachycardia, paroxysmal ([place])   . Back pain at L4-L5 level 02/20/2015  . Compression fracture of L4 lumbar vertebra ([facility]) 02/20/2015  . GI bleed 02/12/2015  . Dyspnea 09/13/2014  . Nephrolithiasis 08/09/2014  . 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 CKD (chronic kidney disease) stage 3, GFR 30-59 ml/min 03/26/2014  . RVF (right ventricular failure) ([place]) 11/01/2013  . VT (ventricular tachycardia) ([place]) 10/22/2013  . LVAD (left ventricular assist device) present ([place]) 10/20/2013  . Acute on chronic systolic heart failure ([place]) 10/19/2013  . Atrial flutter ([place]) 10/15/2013  . Syncope 10/12/2013  . Weakness generalized 09/23/2013  . Other malaise and fatigue 09/23/2013  . Palliative care encounter 09/23/2013  .  Fever 09/20/2013  . Sepsis(995.91) 09/20/2013  . Atrial tachycardia ([place]) 08/27/2013  . Chronic systolic heart failure ([place]) 08/27/2013  . Sinus bradycardia 05/11/2013  . Inappropriate therapy from implantable cardioverter-defibrillator 04/02/2013  . History of AAA (abdominal aortic aneurysm) repair 11/25/2012  . CAD (coronary artery disease) 04/30/2012  . Occlusion and stenosis of carotid artery without mention of cerebral infarction 03/25/2012  . PVD (peripheral vascular disease) ([place])   . HTN (hypertension)   . HLD (hyperlipidemia)   . Ischemic cardiomyopathy   . Automatic implantable cardioverter-defibrillator in situ 04/09/2011  . VENTRICULAR TACHYCARDIA 09/07/2009  . RENAL INSUFFICIENCY 09/07/2009    Past Surgical History:  Procedure Laterality Date  . BACK SURGERY    . CARDIAC CATHETERIZATION     "several" (05/25/2013)  . CARDIAC DEFIBRILLATOR PLACEMENT  2003; 2005; 05/25/2013   2014: Medtronic Evera device serial number [id number]  . CARDIAC ELECTROPHYSIOLOGY STUDY AND ABLATION  2012  . CARDIOVERSION N/A 10/15/2013   Procedure: CARDIOVERSION;  Surgeon: Jolaine Artist, MD;  Location: [place];  Service: Cardiovascular;  Laterality: N/A;  . CARDIOVERSION N/A 11/04/2013   Procedure: CARDIOVERSION;  Surgeon: Larey Dresser, MD;  Location: [place];  Service: Cardiovascular;  Laterality: N/A;  . CARDIOVERSION N/A 06/04/2014   Procedure: CARDIOVERSION;  Surgeon: Jolaine Artist, MD;  Location: [facility] ENDOSCOPY;  Service: Cardiovascular;  Laterality: N/A;  . CARDIOVERSION N/A 03/02/2015   Procedure: CARDIOVERSION;  Surgeon: Jolaine Artist, MD;  Location: [facility] ENDOSCOPY;  Service: Cardiovascular;  Laterality: N/A;  . CARDIOVERSION N/A 01/19/2016   Procedure: CARDIOVERSION;  Surgeon: Larey Dresser, MD;  Location: [place];  Service: Cardiovascular;  Laterality: N/A;  . CAROTID ENDARTERECTOMY Left ~ 2007  . CATARACT EXTRACTION W/ INTRAOCULAR LENS  IMPLANT, BILATERAL  Bilateral 2000  . CHOLECYSTECTOMY  12/15/?2010  . CORONARY ANGIOPLASTY  1992  . CORONARY ANGIOPLASTY WITH STENT PLACEMENT     "last one was 11/2012  (05/25/2013)  . CYSTOSCOPY WITH RETROGRADE PYELOGRAM, URETEROSCOPY AND STENT PLACEMENT Left 08/09/2014   Procedure: CYSTOSCOPY WITH RETROGRADE PYELOGRAM, URETEROSCOPY AND STENT PLACEMENT;  Surgeon: Bernestine Amass, MD;  Location: [facility];  Service: Urology;  Laterality: Left;  . CYSTOSCOPY WITH RETROGRADE PYELOGRAM, URETEROSCOPY AND STENT PLACEMENT Left 09/01/2014   Procedure: CYSTOSCOPY WITH URETEROSCOPY AND STENT REMOVAL;  Surgeon: Bernestine Amass, MD;  Location: [facility];  Service: Urology;  Laterality: Left;  . CYSTOSCOPY WITH RETROGRADE PYELOGRAM, URETEROSCOPY AND STENT PLACEMENT Left 09/20/2014   Procedure: CYSTOSCOPY WITH RETROGRADE PYELOGRAM, URETEROSCOPY AND STENT PLACEMENT, stone removal;  Surgeon: Bernestine Amass, MD;  Location: [facility];  Service: Urology;  Laterality: Left;  . DOPPLER ECHOCARDIOGRAPHY  2011  . ELBOW ARTHROSCOPY Right 1990's  . ENDARTERECTOMY Right 01/13/2016  Procedure: RIGHT CAROTID ARTERY ENDARTERECTOMY;  Surgeon: Mal Misty, MD;  Location: [place];  Service: Vascular;  Laterality: Right;  . ESOPHAGOGASTRODUODENOSCOPY N/A 02/15/2015   Procedure: ESOPHAGOGASTRODUODENOSCOPY (EGD);  Surgeon: Carol Ada, MD;  Location: [facility] ENDOSCOPY;  Service: Endoscopy;  Laterality: N/A;  LVAD  . EYE SURGERY     VITRECTOMY  LEFT 05/2012  . FEMORAL-POPLITEAL BYPASS GRAFT Right 2011  . FEMORAL-POPLITEAL BYPASS GRAFT Left 05/2002   Archie Endo 05/06/2002 (05/25/2013)  . HEEL SPUR SURGERY Right 1990's  . HOLMIUM LASER APPLICATION Left [id number]   Procedure: HOLMIUM LASER APPLICATION;  Surgeon: Bernestine Amass, MD;  Location: [facility];  Service: Urology;  Laterality: Left;  . IMPLANTABLE CARDIOVERTER DEFIBRILLATOR GENERATOR CHANGE N/A 05/25/2013   Procedure: IMPLANTABLE CARDIOVERTER DEFIBRILLATOR GENERATOR CHANGE;  Surgeon: Deboraha Sprang, MD;  Location: [facility]  CATH LAB;  Service: Cardiovascular;  Laterality: N/A;  . INSERTION OF IMPLANTABLE LEFT VENTRICULAR ASSIST DEVICE N/A 10/19/2013   Procedure: INSERTION OF IMPLANTABLE LEFT VENTRICULAR ASSIST DEVICE;  Surgeon: Gaye Pollack, MD;  Location: [facility];  Service: Open Heart Surgery;  Laterality: N/A;  CIRC ARREST  NITRIC OXIDE  MEDTRONIC ICD  . INTRAOPERATIVE TRANSESOPHAGEAL ECHOCARDIOGRAM N/A 10/19/2013   Procedure: INTRAOPERATIVE TRANSESOPHAGEAL ECHOCARDIOGRAM;  Surgeon: Gaye Pollack, MD;  Location[facility];  Service: Open Heart Surgery;  Laterality: N/A;  . KNEE ARTHROSCOPY Bilateral 1990's  . LEAD REVISION N/A 06/05/2013   Procedure: LEAD REVISION;  Surgeon: Thompson Grayer, MD;  Location: [facility] CATH LAB;  Service: Cardiovascular;  Laterality: N/A;  . LEFT HEART CATHETERIZATION WITH CORONARY ANGIOGRAM N/A 11/24/2012   Procedure: LEFT HEART CATHETERIZATION WITH CORONARY ANGIOGRAM;  Surgeon: Peter M Martinique, MD;  Location: [facility] CATH LAB;  Service: Cardiovascular;  Laterality: N/A;  . LIPOMA EXCISION Left 2013   "near ear" (05/25/2013)  . [place]; 2000's  . PATCH ANGIOPLASTY Right 01/13/2016   Procedure: WITH  PATCH ANGIOPLASTY;  Surgeon: Mal Misty, MD;  Location: [place];  Service: Vascular;  Laterality: Right;  . RENAL ARTERY BYPASS Bilateral 2003  . REVASCULARIZATION / IN-SITU GRAFT LEG    . RIGHT HEART CATHETERIZATION N/A 09/17/2013   Procedure: RIGHT HEART CATH;  Surgeon: Jolaine Artist, MD;  Location: [facility] CATH LAB;  Service: Cardiovascular;  Laterality: N/A;  . RIGHT HEART CATHETERIZATION N/A 10/14/2013   Procedure: RIGHT HEART CATH;  Surgeon: Jolaine Artist, MD;  Location: [facility] CATH LAB;  Service: Cardiovascular;  Laterality: N/A;  . RIGHT HEART CATHETERIZATION N/A 11/16/2013   Procedure: RIGHT HEART CATH;  Surgeon: Jolaine Artist, MD;  Location: [facility] CATH LAB;  Service: Cardiovascular;  Laterality: N/A;  . RIGHT HEART CATHETERIZATION N/A 07/13/2014   Procedure: RIGHT HEART  CATH;  Surgeon: Jolaine Artist, MD;  Location: [facility] CATH LAB;  Service: Cardiovascular;  Laterality: N/A;  . TEE WITHOUT CARDIOVERSION N/A 11/04/2013   Procedure: TRANSESOPHAGEAL ECHOCARDIOGRAM (TEE);  Surgeon: Larey Dresser, MD;  Location: [place];  Service: Cardiovascular;  Laterality: N/A;  . TEE WITHOUT CARDIOVERSION N/A 03/02/2015   Procedure: TRANSESOPHAGEAL ECHOCARDIOGRAM (TEE);  Surgeon: Jolaine Artist, MD;  Location: [facility] ENDOSCOPY;  Service: Cardiovascular;  Laterality: N/A;  . TEE WITHOUT CARDIOVERSION N/A 01/19/2016   Procedure: TRANSESOPHAGEAL ECHOCARDIOGRAM (TEE);  Surgeon: Larey Dresser, MD;  Location: [place];  Service: Cardiovascular;  Laterality: N/A;  . TONSILLECTOMY  1946  . TUMOR EXCISION Left 1960's   "fatty tumor" (05/25/2013)  . V-TACH ABLATION N/A 09/12/2011   Procedure: V-TACH ABLATION;  Surgeon: Carleene Overlie  Peyton Najjar, MD;  Location: [facility] CATH LAB;  Service: Cardiovascular;  Laterality: N/A;  . V-TACH ABLATION N/A 06/08/2013   Procedure: V-TACH ABLATION;  Surgeon: Evans Lance, MD;  Location: [facility] CATH LAB;  Service: Cardiovascular;  Laterality: N/A;       Home Medications    Prior to Admission medications   Medication Sig Start Date End Date Taking? Authorizing Provider  amiodarone (PACERONE) 200 MG tablet Take 0.5 tablets (100 mg total) by mouth daily. 04/04/16   Amy D Clegg, NP  amLODipine (NORVASC) 10 MG tablet Take 1 tablet (10 mg total) by mouth at bedtime. 07/11/16   Jolaine Artist, MD  atorvastatin (LIPITOR) 80 MG tablet Take 80 mg by mouth daily.     Historical Provider, MD  clopidogrel (PLAVIX) 75 MG tablet Take 1 tablet (75 mg total) by mouth daily. 03/20/16   Amy D Clegg, NP  docusate sodium (COLACE) 100 MG capsule Take 100 mg by mouth daily.     Historical Provider, MD  furosemide (LASIX) 20 MG tablet Take 1 tablet (20 mg total) by mouth as needed for edema (weight gain). 11/14/15   Jolaine Artist, MD  levothyroxine (SYNTHROID, LEVOTHROID) 200  MCG tablet Take one 200 mcg daily before breakfast every day with 1 1/2 (300 mcg) every Monday. Patient taking differently: Take 200-300 mcg by mouth daily before breakfast. Take 1 tablet (267mcg) all days except on Mondays take 1&1/2 tablets (388mcg) 10/26/15   Jolaine Artist, MD  metoprolol succinate (TOPROL-XL) 25 MG 24 hr tablet Take 1 tablet (25 mg total) by mouth 2 (two) times daily. 05/14/16   Larey Dresser, MD  Multiple Vitamins-Minerals (ICAPS PO) Take 1 tablet by mouth 2 (two) times daily.    Historical Provider, MD  pantoprazole (PROTONIX) 40 MG tablet Take 1 tablet (40 mg total) by mouth 2 (two) times daily. 05/07/16   Jolaine Artist, MD  ranolazine (RANEXA) 500 MG 12 hr tablet TAKE 1 TABLET (500 MG TOTAL) BY MOUTH 2 (TWO) TIMES DAILY. 01/20/16   Shirley Friar, PA-C  warfarin (COUMADIN) 2 MG tablet Take 2-4 mg by mouth daily. Take 1 tablet (2mg ) all days except on Monday take 2 tablets (4mg )    Historical Provider, MD  zinc gluconate 50 MG tablet Take 50 mg by mouth 2 (two) times daily.     Historical Provider, MD    Family History Family History  Problem Relation Age of Onset  . Heart attack Mother   . Coronary artery disease Mother   . Cancer Mother   . Heart disease Mother   . Hyperlipidemia Mother   . Hypertension Mother   . Coronary artery disease Father   . Stroke Father   . Cancer Father   . Heart disease Father     before age 34  . Hyperlipidemia Father   . Hypertension Father   . Heart attack Father   . Coronary artery disease      Social History Social History  Substance Use Topics  . Smoking status: Former Smoker    Packs/day: 0.50    Years: 37.00    Types: Cigarettes    Quit date: 11/05/2001  . Smokeless tobacco: Never Used  . Alcohol use 0.0 oz/week     Comment: 05/25/2013 "mixed drink couple times/month"     Allergies   Demerol [meperidine]   Review of Systems Review of Systems  All systems reviewed and negative, other than as  noted in HPI.  Physical  Exam Updated Vital Signs Pulse 114   Temp 103 F (39.4 C) (Rectal)   Resp 20   Ht 6' (1.829 m)   Wt 210 lb (95.3 kg)   SpO2 99%   BMI 28.48 kg/m   Physical Exam  Constitutional: He appears well-developed and well-nourished. No distress.  Sitting in bed. Appears tired but not distressed.   HENT:  Head: Normocephalic and atraumatic.  Eyes: Conjunctivae are normal. Right eye exhibits no discharge. Left eye exhibits no discharge.  Neck: Neck supple.  Cardiovascular: Exam reveals no gallop and no friction rub.   No murmur heard. Continuous hum  Pulmonary/Chest: Effort normal and breath sounds normal. No respiratory distress.  Abdominal: Soft. He exhibits no distension. There is no tenderness.  Musculoskeletal: He exhibits no edema or tenderness.  Neurological: He is alert.  Skin: Skin is warm and dry.  Psychiatric: He has a normal mood and affect. His behavior is normal. Thought content normal.  Nursing note and vitals reviewed.    ED Treatments / Results  Labs (all labs ordered are listed, but only abnormal results are displayed) Labs Reviewed  CBC WITH DIFFERENTIAL/PLATELET - Abnormal; Notable for the following:       Result Value   WBC 13.6 (*)    RBC 3.63 (*)    Hemoglobin 11.4 (*)    HCT 36.1 (*)    Platelets 129 (*)    Neutro Abs 11.4 (*)    Lymphs Abs 0.6 (*)    Monocytes Absolute 1.6 (*)    All other components within normal limits  CULTURE, BLOOD (ROUTINE X 2)  CULTURE, BLOOD (ROUTINE X 2)  BASIC METABOLIC PANEL  URINALYSIS, ROUTINE W REFLEX MICROSCOPIC (NOT AT [facility])  LACTATE DEHYDROGENASE  BRAIN NATRIURETIC PEPTIDE    EKG  EKG Interpretation None       Radiology Dg Chest Portable 1 View  Result Date: 07/18/2016 CLINICAL DATA:  Cough, shortness of breath, fever for 2 days. Tachycardia. EXAM: PORTABLE CHEST 1 VIEW COMPARISON:  Most recent chest imaging radiographs 02/04/2016 FINDINGS: Patient is post median sternotomy. Left  ventricular assist device again seen. Left-sided pacemaker in place. There is stable cardiomegaly and tortuosity of the thoracic aorta. Improved interstitial opacities from prior, likely resolved edema. Mild bibasilar atelectasis. No large pleural effusion. No focal airspace disease. IMPRESSION: Improved pulmonary edema from prior. Stable postsurgical change and cardiomegaly. No evidence of pneumonia. Electronically Signed   By: Jeb Levering M.D.   On: 07/18/2016 23:39    Procedures Procedures (including critical care time)  Medications Ordered in ED Medications  acetaminophen (TYLENOL) tablet 1,000 mg (not administered)  sodium chloride 0.9 % bolus 1,000 mL (not administered)  piperacillin-tazobactam (ZOSYN) IVPB 3.375 g (not administered)     Initial Impression / Assessment and Plan / ED Course  I have reviewed the triage vital signs and the nursing notes.  Pertinent labs & imaging results that were available during my care of the patient were reviewed by me and considered in my medical decision making (see chart for details).  Clinical Course    75 year old male with fever and generalized weakness. Possible viral illness. Given his status as severe heart failure with an LVAD, he received empiric antibiotics and blood cultures. Clinically dehydrated. Evaluated promptly by cardiology who admitted him.  Final Clinical Impressions(s) / ED Diagnoses   Final diagnoses:  Generalized weakness  Febrile illness    New Prescriptions New Prescriptions   No medications on file     Virgel Manifold, MD  07/22/16 0825  

## 2016-07-19 NOTE — Progress Notes (Signed)
VAD Coordinator Procedure Note:   Patient underwent synchronized cardioversion in the cath lab. Hemodynamics and VAD parameters monitored by me and cath lab staff throughout the procedure. MAPs were obtained with automatic BP cuff.      Doppler Auto cuff(MAP):  Flow: PI: Power:     Speed:   Time:             Pre-procedure:115/79(91)       4.1  5.7    4.7  9000    1515   Sedation Induction:112/88 (97)        4.1  5.8    4.8  9000    1524   Interventions:  Pt shocked at 360 joules x1. Converted to a NSR.   Recovery area:Pt transported back to 2H28 on batteries and on the telemetry monitor to recover.   Patient tolerated the procedure well. PIs were 5.2-6.9 throughout the case with no power elevations. After adequate sedation was achieved, pulse ox 92 and maintained >92% throughout the remainder of the procedure. MAPs were 84-97.   Patient Disposition: Once in [place], pt was awake, alert and pain free. Report given to [place], Therapist, sports.  Post procedure parameters on 2H are as follows: BP: 99/78 HR: 78 SpO2:96 Flow: 4.1 Speed: 9000 Flow: 4.1  Power: 4.8

## 2016-07-19 NOTE — H&P (Signed)
EP Attending  Patient is a 75 yo man with a longstanding cardiomyopathy, s/p ICD, with VT and an LVAD who was admitted with CHF and found to be in VT. We have tried to pace terminate his VT unsuccessfully.   Exam reveals a Reg tachy and minimal rales and warm extremites. Neuro is nonfocal.   Tele - VT  A/P 1. Incessant VT - I have discussed the treatment options and recommend we proceed with DCCV. He has been fasting for 5 hours. Will plan to sedate with IV versed and fentanyl.  Mikle Bosworth.D.

## 2016-07-19 NOTE — Progress Notes (Signed)
Pharmacy Antibiotic Note  [name] is a 75 y.o. male admitted on 07/18/2016 with bronchitis w/ possible sepsis.  Pharmacy has been consulted for Vancocin and Levaquin dosing w/ plan to narrow to Levaquin in am per cards.  Plan: Vancomycin 1g IV x1 and f/u in am whether to continue. Levaquin 500mg  IV Q24H.  Height: 6' (182.9 cm) Weight: 210 lb (95.3 kg) IBW/kg (Calculated) : 77.6  Temp (24hrs), Avg:101.2 F (38.4 C), Min:99.4 F (37.4 C), Max:103 F (39.4 C)   Recent Labs Lab 07/18/16 2247  WBC 13.6*      Allergies  Allergen Reactions  . Demerol [Meperidine] Other (See Comments)    Paralysis. Could only move eyes.     Thank you for allowing pharmacy to be a part of this patient's care.  Wynona Neat, PharmD, BCPS  07/19/2016 12:22 AM

## 2016-07-20 ENCOUNTER — Encounter (HOSPITAL_COMMUNITY): Payer: Self-pay | Admitting: Internal Medicine

## 2016-07-20 DIAGNOSIS — I5023 Acute on chronic systolic (congestive) heart failure: Secondary | ICD-10-CM

## 2016-07-20 DIAGNOSIS — I472 Ventricular tachycardia: Secondary | ICD-10-CM

## 2016-07-20 DIAGNOSIS — I484 Atypical atrial flutter: Secondary | ICD-10-CM

## 2016-07-20 LAB — COMPREHENSIVE METABOLIC PANEL
ALT: 30 U/L (ref 17–63)
ANION GAP: 10 (ref 5–15)
AST: 63 U/L — AB (ref 15–41)
Albumin: 3.4 g/dL — ABNORMAL LOW (ref 3.5–5.0)
Alkaline Phosphatase: 79 U/L (ref 38–126)
BILIRUBIN TOTAL: 1 mg/dL (ref 0.3–1.2)
BUN: 28 mg/dL — AB (ref 6–20)
CHLORIDE: 107 mmol/L (ref 101–111)
CO2: 23 mmol/L (ref 22–32)
Calcium: 8.5 mg/dL — ABNORMAL LOW (ref 8.9–10.3)
Creatinine, Ser: 1.46 mg/dL — ABNORMAL HIGH (ref 0.61–1.24)
GFR, EST AFRICAN AMERICAN: 52 mL/min — AB (ref >60)
GFR, EST NON AFRICAN AMERICAN: 45 mL/min — AB (ref >60)
Glucose, Bld: 118 mg/dL — ABNORMAL HIGH (ref 65–99)
POTASSIUM: 3.7 mmol/L (ref 3.5–5.1)
Sodium: 140 mmol/L (ref 135–145)
TOTAL PROTEIN: 6 g/dL — AB (ref 6.5–8.1)

## 2016-07-20 LAB — CBC
HEMATOCRIT: 29.9 % — AB (ref 39.0–52.0)
HEMOGLOBIN: 9.5 g/dL — AB (ref 13.0–17.0)
MCH: 31.5 pg (ref 26.0–34.0)
MCHC: 31.8 g/dL (ref 30.0–36.0)
MCV: 99 fL (ref 78.0–100.0)
Platelets: 97 10*3/uL — ABNORMAL LOW (ref 150–400)
RBC: 3.02 MIL/uL — AB (ref 4.22–5.81)
RDW: 15.6 % — AB (ref 11.5–15.5)
WBC: 6.4 10*3/uL (ref 4.0–10.5)

## 2016-07-20 LAB — LACTATE DEHYDROGENASE: LDH: 273 U/L — AB (ref 98–192)

## 2016-07-20 LAB — PROTIME-INR
INR: 2.96
PROTHROMBIN TIME: 31.5 s — AB (ref 11.4–15.2)

## 2016-07-20 MED ORDER — AMIODARONE IV BOLUS ONLY 150 MG/100ML
150.0000 mg | Freq: Once | INTRAVENOUS | Status: AC
Start: 2016-07-20 — End: 2016-07-20
  Administered 2016-07-20: 150 mg via INTRAVENOUS

## 2016-07-20 MED ORDER — FUROSEMIDE 10 MG/ML IJ SOLN
20.0000 mg | Freq: Once | INTRAMUSCULAR | Status: AC
  Administered 2016-07-20: 20 mg via INTRAVENOUS
  Filled 2016-07-20: qty 2

## 2016-07-20 NOTE — Progress Notes (Signed)
HeartMate 2 Rounding Note  Subjective:    Admitted 07/18/16 with high fever and productive cough.  ? CAP but CXR clear. Though most likely acute bronchitis vs atypical PNA. Started on Vanc and given dose of Zosyn in ED.  Now narrowed to Vanc/Levaquin.     BCx pending. Sputum culture inconclusive ( Upper respiratory)    S/p DCCV 07/19/16 for V-Tach.  Feeling down.  Breathing OK when he's not coughing.  Does feel much better back in NSR.   No further fever.     LDH 256 -> 273 INR 2.38 -> 2.96 WBC 13.6 -> 6.4  LVAD INTERROGATION:  Flow 3.5 liters/min, speed 9000, power 4.4, PI 5.1.   Objective:    Vital Signs:   Temp:  [98 F (36.7 C)-98.7 F (37.1 C)] 98.1 F (36.7 C) (09/15 0829) Pulse Rate:  [0-288] 87 (09/15 0829) Resp:  [0-22] 15 (09/15 0829) BP: (88-115)/(64-89) 96/82 (09/15 0829) SpO2:  [0 %-99 %] 96 % (09/15 0829) Weight:  [203 lb 7.8 oz (92.3 kg)] 203 lb 7.8 oz (92.3 kg) (09/15 0600) Last BM Date: 07/19/16 Mean arterial Pressure 80-90s  Intake/Output:   Intake/Output Summary (Last 24 hours) at 07/20/16 0843 Last data filed at 07/20/16 0832  Gross per 24 hour  Intake          1638.98 ml  Output             2620 ml  Net          -981.02 ml     Physical Exam: General:  Weak appearing. NAD HEENT: normal Neck: supple. JVP 8-9 cm. R CEA scar Carotids 2+ bilat; no bruits. No thyromegaly or nodule noted.  Cor: Mechanical heart sounds with LVAD hum present. Lungs: Scant crackles, Mildly diminished basilar sounds.  Abdomen: soft, NT, ND, no HSM. No bruits or masses. +BS  Driveline: C/D/I; securement device intact and driveline incorporated Extremities: no cyanosis, clubbing, rash, trace to 1+ edema R ankle edema Neuro: alert & orientedx3, cranial nerves grossly intact. moves all 4 extremities w/o difficulty. Affect pleasant  Telemetry: Reviewed, NSR  Labs: Basic Metabolic Panel:  Recent Labs Lab 07/18/16 2247 07/19/16 0422 07/20/16 0328  NA 138 138  140  K 4.3 4.0 3.7  CL 107 109 107  CO2 20* 23 23  GLUCOSE 138* 138* 118*  BUN 31* 32* 28*  CREATININE 1.87* 1.78* 1.46*  CALCIUM 9.2 8.7* 8.5*    Liver Function Tests:  Recent Labs Lab 07/19/16 0422 07/20/16 0328  AST 53* 63*  ALT 25 30  ALKPHOS 91 79  BILITOT 1.0 1.0  PROT 6.1* 6.0*  ALBUMIN 3.6 3.4*   No results for input(s): LIPASE, AMYLASE in the last 168 hours. No results for input(s): AMMONIA in the last 168 hours.  CBC:  Recent Labs Lab 07/18/16 2247 07/20/16 0328  WBC 13.6* 6.4  NEUTROABS 11.4*  --   HGB 11.4* 9.5*  HCT 36.1* 29.9*  MCV 99.4 99.0  PLT 129* 97*    INR:  Recent Labs Lab 07/18/16 07/19/16 0422 07/20/16 0328  INR 2.3 2.38 2.96    Other results:  EKG:   Imaging: Dg Chest Portable 1 View  Result Date: 07/18/2016 CLINICAL DATA:  Cough, shortness of breath, fever for 2 days. Tachycardia. EXAM: PORTABLE CHEST 1 VIEW COMPARISON:  Most recent chest imaging radiographs 02/04/2016 FINDINGS: Patient is post median sternotomy. Left ventricular assist device again seen. Left-sided pacemaker in place. There is stable cardiomegaly and tortuosity of the  thoracic aorta. Improved interstitial opacities from prior, likely resolved edema. Mild bibasilar atelectasis. No large pleural effusion. No focal airspace disease. IMPRESSION: Improved pulmonary edema from prior. Stable postsurgical change and cardiomegaly. No evidence of pneumonia. Electronically Signed   By: Jeb Levering M.D.   On: 07/18/2016 23:39     Medications:     Scheduled Medications: . amiodarone  100 mg Oral Daily  . atorvastatin  80 mg Oral Daily  . clopidogrel  75 mg Oral Daily  . docusate sodium  100 mg Oral Daily  . Influenza vac split quadrivalent PF  0.5 mL Intramuscular Tomorrow-1000  . levofloxacin (LEVAQUIN) IV  500 mg Intravenous Q24H  . [START ON 07/23/2016] levothyroxine  300 mcg Oral Q Mon   And  . levothyroxine  200 mcg Oral Once per day on Sun Tue Wed Thu Fri  Sat  . pantoprazole  40 mg Oral BID  . ranolazine  500 mg Oral BID  . sodium chloride flush  3 mL Intravenous Q12H  . warfarin  2 mg Oral Once per day on Tue Thu  . warfarin  4 mg Oral Once per day on Sun Mon Wed Fri Sat  . Warfarin - Pharmacist Dosing Inpatient   Does not apply q1800    Infusions: . amiodarone 30 mg/hr (07/20/16 0205)    PRN Medications: sodium chloride, acetaminophen, benzonatate, ondansetron (ZOFRAN) IV, sodium chloride flush   Assessment:   1. Fever and productive cough -> acute bronchitis 2. 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 PAF/AFL with RVR s/p TEE dccv on 01/19/16 4. CKD stage III 5. 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 Now on Plavix and warfarin at home.  6. R internal carotid stenosis - s/p R CEA 01/13/16 - Had f/u 3/28. Stable. 7. Lumbar compression fracture - s/p laminectomy 3/17 8. Wide QRS on ECG -? Hyperkalemia 9. Hypothryoidism.  Plan/Discussion:    Admitted with high fever. CXR clear. Acute bronchitis vs atypical PNA.  Sputum Culture inconclusive. Working ABX narrowed to [employer].    Afebrile x 36 hrs now.  MAPs into 80-90s now back in NSR. Got 40 mg IV lasix last night. Repeat dose 20 mg IV lasix this am.   May be able to resume Toprol and amlodopine as tolerated.   Consult PT.   INR 2.96. Dosing per pharmacy.   I reviewed the LVAD parameters from today, and compared the results to the patient's prior recorded data.  No programming changes were made.  The LVAD is functioning within specified parameters.  The patient performs LVAD self-test daily.  LVAD interrogation was negative for any significant power changes, alarms or PI events/speed drops.  LVAD equipment check completed and is in good working order.  Back-up equipment present.   LVAD education done on emergency procedures and precautions and reviewed exit site care.  Length of Stay: 1  Annamaria Helling 07/20/2016, 8:43 AM  VAD Team --- VAD  ISSUES ONLY--- Pager [phone] (7am - 7am)  Advanced Heart Failure Team  Pager [phone] (M-F; 7a - 4p)  Please contact [facility] for night-coverage after hours (4p -7a ) and weekends on [email or web address]  Patient seen and examined with Oda Kilts, PA-C. We discussed all aspects of the encounter. I agree with the assessment and plan as stated above.   Had sustained VT yesterday and underwent DC-CV. Post-procedure had pulmonary edema and received IV lasix. Now improved. In NSR this am. Continue IV amio. Had hyperthyroidism  with amio in past. Once controlled would ideally like to switch to mexilitene. Will d/w EP.   Bronchitis improving. Continue levaquin.   VAD parameters stable. INR ok.   Bensimhon, Daniel,MD 2:04 PM

## 2016-07-20 NOTE — Progress Notes (Signed)
CTSP for recurrent tachycardia.  Device interrogation reveals atrial flutter with a cycle length of 259msec that degenerated into coarse AF with atrial pacing (cycle length 230-248msec). Discussed with AHF team - plan to continue IV amiodarone tonight and reattempt pace termination tomorrow depending on rhythm at that time. Atrial therapies left programmed on.  Episode started around 12:30PM this afternoon.   See paper chart for full interrogation.   Dr Curt Bears here over the weekend for EP issues.  Chanetta Marshall, NP 07/20/2016 1:28 PM  Thompson Grayer MD, [facility] 07/20/2016 5:43 PM

## 2016-07-20 NOTE — Progress Notes (Signed)
ANTICOAGULATION CONSULT NOTE - Follow Up Consult  Pharmacy Consult for Coumadin Indication: LVAD  Allergies  Allergen Reactions  . Demerol [Meperidine] Other (See Comments)    Paralysis. Could only move eyes. Paralysis. Could only move eyes.  [name] Opium Other (See Comments)    "CAN'T MOVE" CATATONIC PER PT.    Patient Measurements: Height: 6' (182.9 cm) Weight: 203 lb 7.8 oz (92.3 kg) IBW/kg (Calculated) : 77.6   Vital Signs: Temp: 98.1 F (36.7 C) (09/15 1137) Temp Source: Oral (09/15 1137) BP: 98/85 (09/15 1137) Pulse Rate: 113 (09/15 1300)  Labs:  Recent Labs  07/18/16 07/18/16 2247 07/19/16 0422 07/20/16 0328  HGB  --  11.4*  --  9.5*  HCT  --  36.1*  --  29.9*  PLT  --  129*  --  97*  LABPROT  --   --  26.4* 31.5*  INR 2.3  --  2.38 2.96  CREATININE  --  1.87* 1.78* 1.46*    Estimated Creatinine Clearance: 47.2 mL/min (by C-G formula based on SCr of 1.46 mg/dL (H)).   Assessment: 75yo male admitted w/ bronchitis to continue Coumadin for VAD;  INR 2.96 today which is above goal of 2.0-2.5. CBC stable. No bleeding noted. Noted DDI with IV Amio and Levaquin, will monitor INR closely.   Home dose: 4 mg daily except for 2 mg on Tues/Sat. Last PTA dose 07/18/2016.   Goal of Therapy:  INR 2.0-2.5  Plan:  1. Hold Warfarin tonight 2. F/U INR tomorrow 3. Monitor for S/Sx of bleeding  Leroy Libman 07/20/2016,1:14 PM

## 2016-07-20 NOTE — Progress Notes (Signed)
Admitted 07/18/16 via EMS  to ED due to high fever and productive cough.   HeartMate II LVAD implanted on 10/09/13 by [facility].   Feels horrible today. Afebrile but with audible wheezing and frequent coughing.   Vital signs: HR: 90s  Doppler MAP: 82 Automated BP: not done O2 Sat: 98  Wt:  205 lbs   LVAD interrogation reveals:  Speed: 9000 Flow: 3.9 Power: 4.7  PI: 4.9 Alarms: none Events:  rare Fixed speed:9000  Low speed limit: 8400  Drive Line: CDI. Will need to be changed on Sunday 07/22/16.  Labs:  LDH trend: 256 > 273  INR trend: 2.38 > 2.96   WBC: 13.6 > 6.4   BNP: 1289   Atrial Arrhythmia: 07/19/16 >> DCCV performed after unable to pace-terminate. IV Amiodarone started.   HeartMate II System Controller SW Version 7.29 upgrade performed in clinic today to accommodate new recommendations from [employer]. Consent reviewed and signed. Tem Tech representative Blair Heys) performed software upgrade.   VAD Coordinator performed elective controller exchange. Patient with dizziness after controller exchange but improved after a few seconds (this is how he typically responds to exchange and ALWAYS needs to be flat). Pump restarted as expected with stable VAD parameters on correct prescribed speed of 9000 / 8400 rpms.    Primary: [id number] Back up: [id number]  Both back up batteries have 24 m until replacement of 11V battery.   Plan/Recommendations:  1. Continue medical care.   2. Was scheduled for RAMP echo next week - unsure if we need to do this now that he is back in rhythm. Wonder if the (---) flows and varied PI's were due to arrhythmia.   3. On levaquin. Still with audible wheezing and reactive cough. Tessalon pearls helping some. May consider a short course of steroid taper for symptom relief? Not sure about bronchodilator with tachycardia.   Janene Madeira, RN VAD Coordinator   Office: [phone] 24/7 Emergency VAD Pager: [phone]

## 2016-07-21 DIAGNOSIS — I483 Typical atrial flutter: Secondary | ICD-10-CM

## 2016-07-21 LAB — CBC
HEMATOCRIT: 30.7 % — AB (ref 39.0–52.0)
HEMOGLOBIN: 9.7 g/dL — AB (ref 13.0–17.0)
MCH: 31.2 pg (ref 26.0–34.0)
MCHC: 31.6 g/dL (ref 30.0–36.0)
MCV: 98.7 fL (ref 78.0–100.0)
PLATELETS: 101 10*3/uL — AB (ref 150–400)
RBC: 3.11 MIL/uL — ABNORMAL LOW (ref 4.22–5.81)
RDW: 15.3 % (ref 11.5–15.5)
WBC: 4.5 10*3/uL (ref 4.0–10.5)

## 2016-07-21 LAB — COMPREHENSIVE METABOLIC PANEL
ALBUMIN: 3.1 g/dL — AB (ref 3.5–5.0)
ALK PHOS: 76 U/L (ref 38–126)
ALT: 32 U/L (ref 17–63)
ANION GAP: 8 (ref 5–15)
AST: 59 U/L — ABNORMAL HIGH (ref 15–41)
BUN: 20 mg/dL (ref 6–20)
CO2: 27 mmol/L (ref 22–32)
Calcium: 8.6 mg/dL — ABNORMAL LOW (ref 8.9–10.3)
Chloride: 105 mmol/L (ref 101–111)
Creatinine, Ser: 1.24 mg/dL (ref 0.61–1.24)
GFR calc Af Amer: >60 mL/min (ref >60)
GFR calc non Af Amer: 55 mL/min — ABNORMAL LOW (ref >60)
GLUCOSE: 109 mg/dL — AB (ref 65–99)
POTASSIUM: 3.4 mmol/L — AB (ref 3.5–5.1)
SODIUM: 140 mmol/L (ref 135–145)
Total Bilirubin: 0.8 mg/dL (ref 0.3–1.2)
Total Protein: 5.7 g/dL — ABNORMAL LOW (ref 6.5–8.1)

## 2016-07-21 LAB — LACTATE DEHYDROGENASE: LDH: 259 U/L — AB (ref 98–192)

## 2016-07-21 LAB — PROTIME-INR
INR: 3.01
PROTHROMBIN TIME: 31.9 s — AB (ref 11.4–15.2)

## 2016-07-21 MED ORDER — POTASSIUM CHLORIDE CRYS ER 20 MEQ PO TBCR: 40.0000 meq | EXTENDED_RELEASE_TABLET | Freq: Once | ORAL | Status: DC

## 2016-07-21 MED ORDER — POTASSIUM CHLORIDE CRYS ER 20 MEQ PO TBCR
40.0000 meq | EXTENDED_RELEASE_TABLET | Freq: Two times a day (BID) | ORAL | Status: AC
  Administered 2016-07-21 (×2): 40 meq via ORAL
  Filled 2016-07-21 (×2): qty 2

## 2016-07-21 MED ORDER — FUROSEMIDE 10 MG/ML IJ SOLN
40.0000 mg | Freq: Once | INTRAMUSCULAR | Status: AC
  Administered 2016-07-21: 40 mg via INTRAVENOUS
  Filled 2016-07-21: qty 4

## 2016-07-21 NOTE — Progress Notes (Signed)
ANTICOAGULATION CONSULT NOTE - Follow Up Consult  Pharmacy Consult for Coumadin Indication: LVAD  Patient Measurements: Height: 6' (182.9 cm) Weight: 204 lb 14.4 oz (92.9 kg) IBW/kg (Calculated) : 77.6   Vital Signs: Temp: 98.3 F (36.8 C) (09/16 0756) Temp Source: Oral (09/16 0756) BP: 95/78 (09/16 0756) Pulse Rate: 61 (09/16 0800)  Labs:  Recent Labs  07/18/16 2247 07/19/16 0422 07/20/16 0328 07/21/16 0337  HGB 11.4*  --  9.5* 9.7*  HCT 36.1*  --  29.9* 30.7*  PLT 129*  --  97* 101*  LABPROT  --  26.4* 31.5* 31.9*  INR  --  2.38 2.96 3.01  CREATININE 1.87* 1.78* 1.46* 1.24    Estimated Creatinine Clearance: 55.6 mL/min (by C-G formula based on SCr of 1.24 mg/dL).  Assessment: 75yo male admitted w/ bronchitis to continue Coumadin for VAD; INR 3.01 today with a slight trend up since yesterday. Will hold dose one more time and likely resume tomorrow to try to keep patient therapeutic. Noted DDI with IV Amio and Levaquin, will monitor INR closely. Hgb 9.7, PLT 101 both stable since yesterday. No bleeding noted.   Home dose: 4 mg daily except for 2 mg on Tues/Sat. Last PTA dose 07/18/2016.   Goal of Therapy:  INR 2.0-2.5  Plan:  1. Hold Warfarin tonight 2. F/U INR tomorrow 3. Monitor for S/Sx of bleeding  Melburn Popper, PharmD Clinical Pharmacy Resident Pager: [phone] 07/21/16 8:13 AM

## 2016-07-21 NOTE — Progress Notes (Signed)
Patient ID: [name], male   DOB: Dec 16, 1940, 75 y.o.   MRN: [id number]    HeartMate 2 Rounding Note  Subjective:    Admitted 07/18/16 with high fever and productive cough.  ? CAP but CXR clear. Though most likely acute bronchitis vs atypical PNA. Started on Vanc and given dose of Zosyn in ED.  Now narrowed to IV levofloxacin.     BCx NGTD. Sputum culture inconclusive ( Upper respiratory)    S/p DCCV 07/19/16 for V-Tach.  Noted to be in rapid atrial flutter 9/15.  Unable to pace terminate.  However, he converted back to NSR last night on amiodarone gtt.    Feels better in NSR.  Still some dyspnea with walking and still with cough.    Got Lasix 20 mg IV yesterday with some diuresis.   No further fever.     LDH 256 -> 273 -> 259 INR 2.38 -> 2.96 -> 3 WBC 13.6 -> 6.4 -> 4.5  LVAD INTERROGATION:  Flow 3.9 liters/min, speed 9000, power 4.7, PI 7.1. No PI events.   Objective:    Vital Signs:   Temp:  [97.9 F (36.6 C)-99.6 F (37.6 C)] 98.3 F (36.8 C) (09/16 0756) Pulse Rate:  [51-122] 61 (09/16 0800) Resp:  [11-25] 11 (09/16 0800) BP: (95-100)/(78-86) 95/78 (09/16 0756) SpO2:  [93 %-100 %] 93 % (09/16 0800) Weight:  [204 lb 14.4 oz (92.9 kg)] 204 lb 14.4 oz (92.9 kg) (09/16 0600) Last BM Date: 07/19/16 Mean arterial Pressure 80-90s  Intake/Output:   Intake/Output Summary (Last 24 hours) at 07/21/16 0951 Last data filed at 07/21/16 0908  Gross per 24 hour  Intake              639 ml  Output             1900 ml  Net            -1261 ml     Physical Exam: General:  Weak appearing. NAD HEENT: normal Neck: supple. JVP 10-12 cm. R CEA scar Carotids 2+ bilat; no bruits. No thyromegaly or nodule noted.  Cor: Mechanical heart sounds with LVAD hum present. Lungs: Scant crackles, Diminished basilar sounds.  Abdomen: soft, NT, ND, no HSM. No bruits or masses. +BS  Driveline: C/D/I; securement device intact and driveline incorporated Extremities: no cyanosis,  clubbing, rash.  1+ ankle edema bilaterally.  Neuro: alert & orientedx3, cranial nerves grossly intact. moves all 4 extremities w/o difficulty. Affect pleasant  Telemetry: Reviewed, NSR  Labs: Basic Metabolic Panel:  Recent Labs Lab 07/18/16 2247 07/19/16 0422 07/20/16 0328 07/21/16 0337  NA 138 138 140 140  K 4.3 4.0 3.7 3.4*  CL 107 109 107 105  CO2 20* 23 23 27   GLUCOSE 138* 138* 118* 109*  BUN 31* 32* 28* 20  CREATININE 1.87* 1.78* 1.46* 1.24  CALCIUM 9.2 8.7* 8.5* 8.6*    Liver Function Tests:  Recent Labs Lab 07/19/16 0422 07/20/16 0328 07/21/16 0337  AST 53* 63* 59*  ALT 25 30 32  ALKPHOS 91 79 76  BILITOT 1.0 1.0 0.8  PROT 6.1* 6.0* 5.7*  ALBUMIN 3.6 3.4* 3.1*   No results for input(s): LIPASE, AMYLASE in the last 168 hours. No results for input(s): AMMONIA in the last 168 hours.  CBC:  Recent Labs Lab 07/18/16 2247 07/20/16 0328 07/21/16 0337  WBC 13.6* 6.4 4.5  NEUTROABS 11.4*  --   --   HGB 11.4* 9.5* 9.7*  HCT 36.1* 29.9* 30.7*  MCV 99.4 99.0 98.7  PLT 129* 97* 101*    INR:  Recent Labs Lab 07/18/16 07/19/16 0422 07/20/16 0328 07/21/16 0337  INR 2.3 2.38 2.96 3.01    Other results:  EKG:   Imaging: No results found.   Medications:     Scheduled Medications: . atorvastatin  80 mg Oral Daily  . clopidogrel  75 mg Oral Daily  . docusate sodium  100 mg Oral Daily  . furosemide  40 mg Intravenous Once  . levofloxacin (LEVAQUIN) IV  500 mg Intravenous Q24H  . [START ON 07/23/2016] levothyroxine  300 mcg Oral Q Mon   And  . levothyroxine  200 mcg Oral Once per day on Sun Tue Wed Thu Fri Sat  . pantoprazole  40 mg Oral BID  . potassium chloride  40 mEq Oral BID  . ranolazine  500 mg Oral BID  . sodium chloride flush  3 mL Intravenous Q12H  . Warfarin - Pharmacist Dosing Inpatient   Does not apply q1800    Infusions: . amiodarone 60 mg/hr (07/21/16 0908)    PRN Medications: sodium chloride, acetaminophen, benzonatate,  ondansetron (ZOFRAN) IV, sodium chloride flush   Assessment:   1. Fever and productive cough -> acute bronchitis 2. 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 PAF/AFL with RVR s/p TEE dccv on 01/19/16 4. CKD stage III 5. 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 Now on Plavix and warfarin at home.  6. R internal carotid stenosis - s/p R CEA 01/13/16 - Had f/u 3/28. Stable. 7. Lumbar compression fracture - s/p laminectomy 3/17 8. VT -DCCV 9/14.  9. Hypothyroidism. 10. Atrial flutter - Unable to pace terminate on 9/15 but later converted to NSR on amiodarone gtt.   Plan/Discussion:    Admitted with high fever.  CXR clear.  Acute bronchitis vs atypical PNA.  Sputum Culture inconclusive.  Blood cultures NGTD.  Working ABX narrowed to [employer], continue.  Afebrile, WBCs down.    He is in NSR this morning (was in rapid atrial flutter yesterday afternoon).  Would continue amiodarone gtt today but decrease to 30 mg/hr.  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  Has had both VT and atrial flutter/RVR this admission.  Will need to discuss with EP but amiodarone with close attention to thyroid may be best long-term option.  He is also on ranolazine.   MAP 80s today.  Still some volume overload and dyspneic.  Will give Lasix 40 mg IV x 1 today and replace K.   I reviewed the LVAD parameters from today, and compared the results to the patient's prior recorded data.  No programming changes were made.  The LVAD is functioning within specified parameters.  The patient performs LVAD self-test daily.  LVAD interrogation was negative for any significant power changes, alarms or PI events/speed drops.  LVAD equipment check completed and is in good working order.  Back-up equipment present.   LVAD education done on emergency procedures and precautions and reviewed exit site care.  Length of Stay: [place],  07/21/2016, 9:51 AM  VAD Team --- VAD ISSUES ONLY--- Pager [phone] (7am  - 7am)  Advanced Heart Failure Team  Pager [phone] (M-F; 7a - 4p)  Please contact [facility] for night-coverage after hours (4p -7a ) and weekends on [email or web address]

## 2016-07-21 NOTE — Evaluation (Signed)
Physical Therapy Evaluation Patient Details Name: [name] MRN: [id number] DOB: [birth year] Today's Date: 07/21/2016   History of Present Illness  Admitted 07/18/16 with high fever and productive cough.  ? CAP but CXR clear. Though most likely acute bronchitis vs atypical PNA.   Pt with LVAD.    Clinical Impression  Pt admitted with above diagnosis. Pt currently with functional limitations due to the deficits listed below (see PT Problem List). Pt was able to stand and pivot with min guard assist.  Should progress well and go home after weekend without f/u.  Pt may need to use RW or rollator initially. Will follow in hospital.   Pt will benefit from skilled PT to increase their independence and safety with mobility to allow discharge to the venue listed below.      Follow Up Recommendations No PT follow up;Supervision - Intermittent    Equipment Recommendations  None recommended by PT    Recommendations for Other Services       Precautions / Restrictions Precautions Precautions: Fall Precaution Comments: LVAD Restrictions Weight Bearing Restrictions: No      Mobility  Bed Mobility Overal bed mobility: Needs Assistance Bed Mobility: Supine to Sit     Supine to sit: Min guard     General bed mobility comments: incr time but pt needed no physical assist.   Transfers Overall transfer level: Needs assistance Equipment used: Rolling walker (2 wheeled) Transfers: Sit to/from [facility] Sit to Stand: Min assist Stand pivot transfers: Min guard       General transfer comment: Slight steadying assist given upon initial stand.  Pt able to take pivotal steps to chair.   Ambulation/Gait                Stairs            Wheelchair Mobility    Modified Rankin (Stroke Patients Only)       Balance Overall balance assessment: Needs assistance Sitting-balance support: No upper extremity supported;Feet supported Sitting balance-Leahy  Scale: Fair     Standing balance support: Bilateral upper extremity supported;During functional activity Standing balance-Leahy Scale: Fair Standing balance comment: can stand statically without device for up to 1 min                             Pertinent Vitals/Pain Pain Assessment: No/denies pain  O2 on 2LO2 on arrival with sats 94%.  Removed O2 but sats at 86% on RA therefore replaced at [place] with sats 90-93%.     Home Living Family/patient expects to be discharged to:: Private residence Living Arrangements: Spouse/significant other Available Help at Discharge: Family;Available PRN/intermittently Type of Home: House Home Access: Ramped entrance     Home Layout: Two level;Able to live on main level with bedroom/bathroom Home Equipment: Gilford Rile - 2 wheels;Walker - 4 wheels;Cane - single point;Bedside commode;Grab bars - tub/shower;Toilet riser;Tub bench;Grab bars - toilet Additional Comments: tub but does not use    Prior Function Level of Independence: Independent         Comments: Reports he drives short distances.  He was not using a device prior to this admission for ambulation.     Hand Dominance   Dominant Hand: Right    Extremity/Trunk Assessment   Upper Extremity Assessment: Defer to OT evaluation           Lower Extremity Assessment: Generalized weakness      Cervical / Trunk Assessment: Normal  Communication   Communication: No difficulties  Cognition Arousal/Alertness: Awake/alert Behavior During Therapy: WFL for tasks assessed/performed Overall Cognitive Status: Within Functional Limits for tasks assessed                      General Comments      Exercises     Assessment/Plan    PT Assessment Patient needs continued PT services  PT Problem List Decreased activity tolerance;Decreased balance;Decreased mobility;Decreased knowledge of use of DME;Decreased safety awareness;Decreased knowledge of  precautions;Cardiopulmonary status limiting activity          PT Treatment Interventions DME instruction;Gait training;Functional mobility training;Therapeutic activities;Therapeutic exercise;Balance training;Patient/family education    PT Goals (Current goals can be found in the Care Plan section)  Acute Rehab PT Goals Patient Stated Goal: to go home Monday PT Goal Formulation: With patient Time For Goal Achievement: 07/28/16 Potential to Achieve Goals: Good    Frequency Min 3X/week   Barriers to discharge        Co-evaluation               End of Session Equipment Utilized During Treatment: Gait belt;Oxygen Activity Tolerance: Patient limited by fatigue Patient left: in chair;with call bell/phone within reach Nurse Communication: Mobility status         Time: [id number] PT Time Calculation (min) (ACUTE ONLY): 24 min   Charges:   PT Evaluation $PT Eval Moderate Complexity: 1 Procedure PT Treatments $Therapeutic Activity: 8-22 mins   PT G CodesIrwin Brakeman F 10-Aug-2016, 9:54 AM Amanda [facility]ne Acute Rehabilitation [phone] (pager)

## 2016-07-22 LAB — CBC
HEMATOCRIT: 32 % — AB (ref 39.0–52.0)
Hemoglobin: 10.1 g/dL — ABNORMAL LOW (ref 13.0–17.0)
MCH: 31.3 pg (ref 26.0–34.0)
MCHC: 31.6 g/dL (ref 30.0–36.0)
MCV: 99.1 fL (ref 78.0–100.0)
PLATELETS: 109 10*3/uL — AB (ref 150–400)
RBC: 3.23 MIL/uL — AB (ref 4.22–5.81)
RDW: 15.2 % (ref 11.5–15.5)
WBC: 5.3 10*3/uL (ref 4.0–10.5)

## 2016-07-22 LAB — COMPREHENSIVE METABOLIC PANEL
ALBUMIN: 3.3 g/dL — AB (ref 3.5–5.0)
ALT: 40 U/L (ref 17–63)
ANION GAP: 9 (ref 5–15)
AST: 63 U/L — AB (ref 15–41)
Alkaline Phosphatase: 88 U/L (ref 38–126)
BUN: 22 mg/dL — AB (ref 6–20)
CHLORIDE: 102 mmol/L (ref 101–111)
CO2: 26 mmol/L (ref 22–32)
CREATININE: 1.41 mg/dL — AB (ref 0.61–1.24)
Calcium: 8.7 mg/dL — ABNORMAL LOW (ref 8.9–10.3)
GFR calc Af Amer: 54 mL/min — ABNORMAL LOW (ref >60)
GFR calc non Af Amer: 47 mL/min — ABNORMAL LOW (ref >60)
Glucose, Bld: 117 mg/dL — ABNORMAL HIGH (ref 65–99)
POTASSIUM: 4.1 mmol/L (ref 3.5–5.1)
SODIUM: 137 mmol/L (ref 135–145)
TOTAL PROTEIN: 5.9 g/dL — AB (ref 6.5–8.1)
Total Bilirubin: 0.5 mg/dL (ref 0.3–1.2)

## 2016-07-22 LAB — PROTIME-INR
INR: 3.02
PROTHROMBIN TIME: 31.9 s — AB (ref 11.4–15.2)

## 2016-07-22 LAB — LACTATE DEHYDROGENASE: LDH: 278 U/L — AB (ref 98–192)

## 2016-07-22 MED ORDER — POTASSIUM CHLORIDE CRYS ER 20 MEQ PO TBCR
40.0000 meq | EXTENDED_RELEASE_TABLET | Freq: Once | ORAL | Status: AC
  Administered 2016-07-22: 40 meq via ORAL
  Filled 2016-07-22: qty 2

## 2016-07-22 MED ORDER — FUROSEMIDE 10 MG/ML IJ SOLN
40.0000 mg | Freq: Two times a day (BID) | INTRAMUSCULAR | Status: AC
  Administered 2016-07-22 (×2): 40 mg via INTRAVENOUS
  Filled 2016-07-22 (×2): qty 4

## 2016-07-22 MED ORDER — AMIODARONE HCL 200 MG PO TABS
400.0000 mg | ORAL_TABLET | Freq: Every day | ORAL | Status: DC
  Administered 2016-07-22 – 2016-07-23 (×2): 400 mg via ORAL
  Filled 2016-07-22 (×2): qty 2

## 2016-07-22 MED ORDER — WARFARIN SODIUM 2 MG PO TABS
1.0000 mg | ORAL_TABLET | Freq: Once | ORAL | Status: AC
Start: 2016-07-22 — End: 2016-07-22
  Administered 2016-07-22: 1 mg via ORAL
  Filled 2016-07-22 (×2): qty 0.5

## 2016-07-22 NOTE — Discharge Instructions (Signed)
Information on my medicine - Coumadin   (Warfarin)  This medication education was reviewed with me or my healthcare representative as part of my discharge preparation.  The pharmacist that spoke with me during my hospital stay was:  Adora Fridge, [facility]  Why was Coumadin prescribed for you? Coumadin was prescribed for you because you have a blood clot or a medical condition that can cause an increased risk of forming blood clots. Blood clots can cause serious health problems by blocking the flow of blood to the heart, lung, or brain. Coumadin can prevent harmful blood clots from forming. As a reminder your indication for Coumadin is:   Blood Clot Prevention After Heart Pump Surgery  What test will check on my response to Coumadin? While on Coumadin (warfarin) you will need to have an INR test regularly to ensure that your dose is keeping you in the desired range. The INR (international normalized ratio) number is calculated from the result of the laboratory test called prothrombin time (PT).  If an INR APPOINTMENT HAS NOT ALREADY BEEN MADE FOR YOU please schedule an appointment to have this lab work done by your health care provider within 7 days. Your INR goal is usually a number between:  2 to 3 or your provider may give you a more narrow range like 2-2.5.  Ask your health care provider during an office visit what your goal INR is.  What  do you need to  know  About  COUMADIN? Take Coumadin (warfarin) exactly as prescribed by your healthcare provider about the same time each day.  DO NOT stop taking without talking to the doctor who prescribed the medication.  Stopping without other blood clot prevention medication to take the place of Coumadin may increase your risk of developing a new clot or stroke.  Get refills before you run out.  What do you do if you miss a dose? If you miss a dose, take it as soon as you remember on the same day then continue your regularly scheduled regimen the next  day.  Do not take two doses of Coumadin at the same time.  Important Safety Information A possible side effect of Coumadin (Warfarin) is an increased risk of bleeding. You should call your healthcare provider right away if you experience any of the following: ? Bleeding from an injury or your nose that does not stop. ? Unusual colored urine (red or dark brown) or unusual colored stools (red or black). ? Unusual bruising for unknown reasons. ? A serious fall or if you hit your head (even if there is no bleeding).  Some foods or medicines interact with Coumadin (warfarin) and might alter your response to warfarin. To help avoid this: ? Eat a balanced diet, maintaining a consistent amount of Vitamin K. ? Notify your provider about major diet changes you plan to make. ? Avoid alcohol or limit your intake to 1 drink for women and 2 drinks for men per day. (1 drink is 5 oz. wine, 12 oz. beer, or 1.5 oz. liquor.)  Make sure that ANY health care provider who prescribes medication for you knows that you are taking Coumadin (warfarin).  Also make sure the healthcare provider who is monitoring your Coumadin knows when you have started a new medication including herbals and non-prescription products.  Coumadin (Warfarin)  Major Drug Interactions  Increased Warfarin Effect Decreased Warfarin Effect  Alcohol (large quantities) Antibiotics (esp. Septra/Bactrim, Flagyl, Cipro) Amiodarone (Cordarone) Aspirin (ASA) Cimetidine (Tagamet) Megestrol (Megace) NSAIDs (  ibuprofen, naproxen, etc.) °Piroxicam (Feldene) °Propafenone (Rythmol SR) °Propranolol (Inderal) °Isoniazid (INH) °Posaconazole (Noxafil) Barbiturates (Phenobarbital) °Carbamazepine (Tegretol) °Chlordiazepoxide (Librium) °Cholestyramine (Questran) °Griseofulvin °Oral Contraceptives °Rifampin °Sucralfate (Carafate) °Vitamin K  ° °Coumadin® (Warfarin) Major Herbal Interactions  °Increased Warfarin Effect Decreased Warfarin Effect   °Garlic °Ginseng °Ginkgo biloba Coenzyme Q10 °Green tea °St. John’s wort   ° °Coumadin® (Warfarin) FOOD Interactions  °Eat a consistent number of servings per week of foods HIGH in Vitamin K °(1 serving = ½ cup)  °Collards (cooked, or boiled & drained) °Kale (cooked, or boiled & drained) °Mustard greens (cooked, or boiled & drained) °Parsley *serving size only = ¼ cup °Spinach (cooked, or boiled & drained) °Swiss chard (cooked, or boiled & drained) °Turnip greens (cooked, or boiled & drained)  °Eat a consistent number of servings per week of foods MEDIUM-HIGH in Vitamin K °(1 serving = 1 cup)  °Asparagus (cooked, or boiled & drained) °Broccoli (cooked, boiled & drained, or raw & chopped) °Brussel sprouts (cooked, or boiled & drained) *serving size only = ½ cup °Lettuce, raw (green leaf, endive, romaine) °Spinach, raw °Turnip greens, raw & chopped  ° °These websites have more information on Coumadin (warfarin):  www.coumadin.com; °www.ahrq.gov/consumer/coumadin.htm; ° ° ° °

## 2016-07-22 NOTE — Progress Notes (Signed)
ANTICOAGULATION CONSULT NOTE - Follow Up Consult  Pharmacy Consult for Coumadin Indication: LVAD  Patient Measurements: Height: 6' (182.9 cm) Weight: 204 lb 6.4 oz (92.7 kg) IBW/kg (Calculated) : 77.6   Vital Signs: Temp: 98 F (36.7 C) (09/17 0800) Temp Source: Oral (09/17 0800) BP: 98/86 (09/17 0800) Pulse Rate: 60 (09/17 0400)  Labs:  Recent Labs  07/20/16 0328 07/21/16 0337 07/22/16 0114  HGB 9.5* 9.7* 10.1*  HCT 29.9* 30.7* 32.0*  PLT 97* 101* 109*  LABPROT 31.5* 31.9* 31.9*  INR 2.96 3.01 3.02  CREATININE 1.46* 1.24 1.41*    Estimated Creatinine Clearance: 48.9 mL/min (by C-G formula based on SCr of 1.41 mg/dL (H)).  Assessment: 75yo male admitted w/ bronchitis to continue Coumadin for VAD. INR on admit at goal 2.4  INR today remains elevated and stable at 3.02 despite doses held since 9/15. Noted DDI with IV Amio and Levaquin, will monitor INR closely. Hgb and pltc remain low but improved. No bleeding noted.   Home dose: 4 mg daily except for 2 mg on Tues/Sat. Last PTA dose 07/18/2016.   Goal of Therapy:  INR 2.0-2.5  Plan:  1. Warfarin 1 mg PO x 1 tonight in an attempt to ensure INR does not fall below goal 2. F/U INR tomorrow 3. Monitor for S/Sx of bleeding  Adhira Jamil K. [name], PharmD, BCPS, CPP Clinical Pharmacist Pager: [phone] Phone: [phone] 07/22/2016 9:59 AM

## 2016-07-22 NOTE — Progress Notes (Signed)
Patient ID: [name], male   DOB: 1941/07/23, 75 y.o.   MRN: [id number]    HeartMate 2 Rounding Note  Subjective:    Admitted 07/18/16 with high fever and productive cough.  ? CAP but CXR clear. Though most likely acute bronchitis vs atypical PNA. Started on Vanc and given dose of Zosyn in ED.  Now narrowed to IV levofloxacin.     BCx NGTD. Sputum culture inconclusive ( Upper respiratory)    S/p DCCV 07/19/16 for V-Tach.  Noted to be in rapid atrial flutter 9/15.  Unable to pace terminate.  However, he converted back to NSR 9/16 early am on amiodarone gtt.  This morning, he is a-paced around 60 bpm.   Feels better in NSR.  Still some dyspnea with walking and still with cough.    Got Lasix 40 mg IV yesterday with some diuresis.   No further fever.     LDH 256 -> 273 -> 259 -> 278 INR 2.38 -> 2.96 -> 3 -> 3 WBC 13.6 -> 6.4 -> 4.5   LVAD INTERROGATION:  Flow 4.2 liters/min, speed 9000, power 4.8, PI 7.1. No PI events.   Objective:    Vital Signs:   Temp:  [97.9 F (36.6 C)-98.4 F (36.9 C)] 98 F (36.7 C) (09/17 0800) Pulse Rate:  [59-78] 60 (09/17 0400) Resp:  [12-24] 13 (09/17 0400) BP: (93-98)/(76-86) 98/86 (09/17 0800) SpO2:  [93 %-100 %] 96 % (09/17 0400) Weight:  [204 lb 6.4 oz (92.7 kg)] 204 lb 6.4 oz (92.7 kg) (09/17 0352) Last BM Date: 07/19/16 Mean arterial Pressure 80-90s  Intake/Output:   Intake/Output Summary (Last 24 hours) at 07/22/16 0909 Last data filed at 07/22/16 0800  Gross per 24 hour  Intake           1103.1 ml  Output              850 ml  Net            253.1 ml     Physical Exam: General:  Weak appearing. NAD HEENT: normal Neck: supple. JVP 10-12 cm. R CEA scar Carotids 2+ bilat; no bruits. No thyromegaly or nodule noted.  Cor: Mechanical heart sounds with LVAD hum present. Lungs: Scant crackles, Diminished basilar sounds.  Abdomen: soft, NT, ND, no HSM. No bruits or masses. +BS  Driveline: C/D/I; securement device intact and  driveline incorporated Extremities: no cyanosis, clubbing, rash.  1+ ankle edema bilaterally.  Neuro: alert & orientedx3, cranial nerves grossly intact. moves all 4 extremities w/o difficulty. Affect pleasant  Telemetry: Reviewed, NSR  Labs: Basic Metabolic Panel:  Recent Labs Lab 07/18/16 2247 07/19/16 0422 07/20/16 0328 07/21/16 0337 07/22/16 0114  NA 138 138 140 140 137  K 4.3 4.0 3.7 3.4* 4.1  CL 107 109 107 105 102  CO2 20* 23 23 27 26   GLUCOSE 138* 138* 118* 109* 117*  BUN 31* 32* 28* 20 22*  CREATININE 1.87* 1.78* 1.46* 1.24 1.41*  CALCIUM 9.2 8.7* 8.5* 8.6* 8.7*    Liver Function Tests:  Recent Labs Lab 07/19/16 0422 07/20/16 0328 07/21/16 0337 07/22/16 0114  AST 53* 63* 59* 63*  ALT 25 30 32 40  ALKPHOS 91 79 76 88  BILITOT 1.0 1.0 0.8 0.5  PROT 6.1* 6.0* 5.7* 5.9*  ALBUMIN 3.6 3.4* 3.1* 3.3*   No results for input(s): LIPASE, AMYLASE in the last 168 hours. No results for input(s): AMMONIA in the last 168 hours.  CBC:  Recent Labs Lab  07/18/16 2247 07/20/16 0328 07/21/16 0337 07/22/16 0114  WBC 13.6* 6.4 4.5 5.3  NEUTROABS 11.4*  --   --   --   HGB 11.4* 9.5* 9.7* 10.1*  HCT 36.1* 29.9* 30.7* 32.0*  MCV 99.4 99.0 98.7 99.1  PLT 129* 97* 101* 109*    INR:  Recent Labs Lab 07/18/16 07/19/16 0422 07/20/16 0328 07/21/16 0337 07/22/16 0114  INR 2.3 2.38 2.96 3.01 3.02    Other results:  EKG:   Imaging: No results found.   Medications:     Scheduled Medications: . atorvastatin  80 mg Oral Daily  . clopidogrel  75 mg Oral Daily  . docusate sodium  100 mg Oral Daily  . levofloxacin (LEVAQUIN) IV  500 mg Intravenous Q24H  . [START ON 07/23/2016] levothyroxine  300 mcg Oral Q Mon   And  . levothyroxine  200 mcg Oral Once per day on Sun Tue Wed Thu Fri Sat  . pantoprazole  40 mg Oral BID  . ranolazine  500 mg Oral BID  . sodium chloride flush  3 mL Intravenous Q12H  . Warfarin - Pharmacist Dosing Inpatient   Does not apply  q1800    Infusions: . amiodarone 30 mg/hr (07/22/16 0700)    PRN Medications: sodium chloride, acetaminophen, benzonatate, ondansetron (ZOFRAN) IV, sodium chloride flush   Assessment:   1. Fever and productive cough -> acute bronchitis 2. 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 PAF/AFL with RVR s/p TEE dccv on 01/19/16 4. CKD stage III 5. 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 Now on Plavix and warfarin at home.  6. R internal carotid stenosis - s/p R CEA 01/13/16 - Had f/u 3/28. Stable. 7. Lumbar compression fracture - s/p laminectomy 3/17 8. VT -DCCV 9/14.  9. Hypothyroidism. 10. Atrial flutter - Unable to pace terminate on 9/15 but later converted to NSR on amiodarone gtt.   Plan/Discussion:    Admitted with high fever.  CXR clear.  Acute bronchitis vs atypical PNA.  Sputum Culture inconclusive.  Blood cultures NGTD.  Working ABX narrowed to [employer], continue.  Afebrile, WBCs down.    He remains in NSR this morning (was in rapid atrial flutter Friday afternoon).  Can convert to po amiodarone today.  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  Has had both VT and atrial flutter/RVR this admission.  Will need to discuss with EP but amiodarone with close attention to thyroid may be best long-term option.  He is also on ranolazine.   MAP 91 today.  Still some volume overload and dyspneic.  Will give Lasix 40 mg IV bid today.   I reviewed the LVAD parameters from today, and compared the results to the patient's prior recorded data.  No programming changes were made.  The LVAD is functioning within specified parameters.  The patient performs LVAD self-test daily.  LVAD interrogation was negative for any significant power changes, alarms or PI events/speed drops.  LVAD equipment check completed and is in good working order.  Back-up equipment present.   LVAD education done on emergency procedures and precautions and reviewed exit site care.  Length of Stay:  [place],  07/22/2016, 9:09 AM  VAD Team --- VAD ISSUES ONLY--- Pager [phone] (7am - 7am)  Advanced Heart Failure Team  Pager [phone] (M-F; 7a - 4p)  Please contact [facility] for night-coverage after hours (4p -7a ) and weekends on [email or web address]

## 2016-07-22 NOTE — Progress Notes (Signed)
Pharmacy Antibiotic Note  [name] is a 75 y.o. male admitted on 07/18/2016 with bronchitis w/ possible sepsis.  Pharmacy has been consulted for Levaquin dosing now on day #4 abx.   Plan: Continue Levaquin 500mg  IV Q24H Follow renal function closely for dose adjustments  Height: 6' (182.9 cm) Weight: 204 lb 6.4 oz (92.7 kg) IBW/kg (Calculated) : 77.6  Temp (24hrs), Avg:98.1 F (36.7 C), Min:97.9 F (36.6 C), Max:98.4 F (36.9 C)   Recent Labs Lab 07/18/16 2247 07/19/16 0422 07/20/16 0328 07/21/16 0337 07/22/16 0114  WBC 13.6*  --  6.4 4.5 5.3  CREATININE 1.87* 1.78* 1.46* 1.24 1.41*      Allergies  Allergen Reactions  . Demerol [Meperidine] Other (See Comments)    Paralysis. Could only move eyes. Paralysis. Could only move eyes.  [name] Opium Other (See Comments)    "CAN'T MOVE" CATATONIC PER PT.   Antimicrobials this admission:  9/14 Vanc/Zosyn x 1 9/14 Levaquin >>  Dose adjustments this admission:  N/a  Microbiology results:  9/13 blood x2>> ngtd 9/14 sputum > negative  Thank you for allowing pharmacy to be a part of this patient's care.  Ruta Hinds. [name], PharmD, BCPS, CPP Clinical Pharmacist Pager: [phone] Phone: [phone] 07/22/2016 10:03 AM

## 2016-07-23 ENCOUNTER — Ambulatory Visit: Payer: Self-pay | Admitting: Unknown Physician Specialty

## 2016-07-23 DIAGNOSIS — I4892 Unspecified atrial flutter: Secondary | ICD-10-CM

## 2016-07-23 LAB — COMPREHENSIVE METABOLIC PANEL
ALT: 40 U/L (ref 17–63)
AST: 51 U/L — ABNORMAL HIGH (ref 15–41)
Albumin: 3.2 g/dL — ABNORMAL LOW (ref 3.5–5.0)
Alkaline Phosphatase: 81 U/L (ref 38–126)
Anion gap: 7 (ref 5–15)
BUN: 16 mg/dL (ref 6–20)
CHLORIDE: 103 mmol/L (ref 101–111)
CO2: 29 mmol/L (ref 22–32)
CREATININE: 1.3 mg/dL — AB (ref 0.61–1.24)
Calcium: 8.5 mg/dL — ABNORMAL LOW (ref 8.9–10.3)
GFR, EST AFRICAN AMERICAN: 60 mL/min — AB (ref >60)
GFR, EST NON AFRICAN AMERICAN: 52 mL/min — AB (ref >60)
Glucose, Bld: 99 mg/dL (ref 65–99)
POTASSIUM: 3.9 mmol/L (ref 3.5–5.1)
Sodium: 139 mmol/L (ref 135–145)
TOTAL PROTEIN: 5.9 g/dL — AB (ref 6.5–8.1)
Total Bilirubin: 0.8 mg/dL (ref 0.3–1.2)

## 2016-07-23 LAB — CBC
HEMATOCRIT: 32.1 % — AB (ref 39.0–52.0)
Hemoglobin: 10.1 g/dL — ABNORMAL LOW (ref 13.0–17.0)
MCH: 30.9 pg (ref 26.0–34.0)
MCHC: 31.5 g/dL (ref 30.0–36.0)
MCV: 98.2 fL (ref 78.0–100.0)
PLATELETS: 129 10*3/uL — AB (ref 150–400)
RBC: 3.27 MIL/uL — AB (ref 4.22–5.81)
RDW: 14.9 % (ref 11.5–15.5)
WBC: 5.2 10*3/uL (ref 4.0–10.5)

## 2016-07-23 LAB — PROTIME-INR
INR: 2.45
PROTHROMBIN TIME: 27.1 s — AB (ref 11.4–15.2)

## 2016-07-23 LAB — LACTATE DEHYDROGENASE: LDH: 256 U/L — ABNORMAL HIGH (ref 98–192)

## 2016-07-23 MED ORDER — LEVOFLOXACIN 500 MG PO TABS
500.0000 mg | ORAL_TABLET | Freq: Every day | ORAL | Status: DC
  Administered 2016-07-23: 500 mg via ORAL
  Filled 2016-07-23: qty 1

## 2016-07-23 MED ORDER — WARFARIN SODIUM 2 MG PO TABS
2.0000 mg | ORAL_TABLET | Freq: Once | ORAL | Status: AC
  Administered 2016-07-23: 2 mg via ORAL
  Filled 2016-07-23: qty 1

## 2016-07-23 MED ORDER — AMIODARONE HCL 200 MG PO TABS: ORAL_TABLET | ORAL | 6 refills | Status: DC

## 2016-07-23 MED ORDER — LEVOFLOXACIN 500 MG PO TABS: 500.0000 mg | ORAL_TABLET | Freq: Every day | ORAL | 0 refills | Status: DC

## 2016-07-23 MED ORDER — BENZONATATE 100 MG PO CAPS: 100.0000 mg | ORAL_CAPSULE | Freq: Three times a day (TID) | ORAL | 0 refills | Status: DC | PRN

## 2016-07-23 NOTE — Progress Notes (Signed)
Spoke w pt. He does not want hhc. He is followed closely by heart failure clinic. States he has very smart girlfriend that cks on him.

## 2016-07-23 NOTE — Progress Notes (Signed)
Admitted 07/18/16 via EMS  to ED due to high fever and productive cough.   HeartMate II LVAD implanted on 10/09/13 by [facility].    Vital signs: HR: 61  Doppler MAP: 92 Automated BP: 101/84 O2 Sat: 98  Wt:  205 > 201lbs   LVAD interrogation reveals:  Speed: 9000 Flow: 4.0 Power: 4.7  PI: 6.9 Alarms: none Events:  rare Fixed speed:9000  Low speed limit: 8400  Drive Line: CDI. Changed yesterday 07/22/16.  Labs:  LDH trend: 256 > 273 > 256  INR trend: 2.38 > 2.96 > 2.45  WBC: 13.6 > 6.4 > 5.2  BNP: 1289   Atrial Arrhythmia: 07/19/16 >> DCCV performed after unable to pace-terminate. IV Amiodarone started.  07/20/16 >> Rapid atrial flutter-unable to pace terminate. Pt converted back to NSR 9/16 early am on amiodarone gtt.   Plan/Recommendations:  1. Home today. Follow up scheduled for 9/25 at 1100. Pt will not need RAMP as this was scheduled before admission. Pt issues were probably coming from arrthymia.  2. See anticoag note for coumadin info and INR follow up.    Tanda Rockers, RN VAD Coordinator   Office: [phone] 24/7 Emergency VAD Pager: [phone]

## 2016-07-23 NOTE — Progress Notes (Signed)
Physical Therapy Treatment Patient Details Name: [name] MRN: [id number] DOB: [birth year] Today's Date: 07/23/2016    History of Present Illness Admitted 07/18/16 with high fever and productive cough.  ? CAP but CXR clear. Though most likely acute bronchitis vs atypical PNA.   Pt with LVAD.      PT Comments    Patient in good spirits and eager to mobilize. He transfers himself to LVAD batteries independently. He was slightly unsteady walking with no device, however he does have rolling walkers at home and agrees he will use RW until he regains his strength.   Follow Up Recommendations  No PT follow up;Supervision - Intermittent     Equipment Recommendations  None recommended by PT    Recommendations for Other Services       Precautions / Restrictions Precautions Precautions: Fall Precaution Comments: LVAD Restrictions Weight Bearing Restrictions: No    Mobility  Bed Mobility                  Transfers Overall transfer level: Needs assistance Equipment used: None Transfers: Sit to/from Stand Sit to Stand: Min assist         General transfer comment: Slight steadying assist given upon initial stand.  Pt using wide BOS to help him steady himself  Ambulation/Gait Ambulation/Gait assistance: Min assist Ambulation Distance (Feet): 380 Feet Assistive device: None Gait Pattern/deviations: Step-through pattern;Decreased stride length;Staggering right;Wide base of support   Gait velocity interpretation: Below normal speed for age/gender General Gait Details: Stagger x 1 requiring min assist to recover   Stairs            Wheelchair Mobility    Modified Rankin (Stroke Patients Only)       Balance     Sitting balance-Leahy Scale: Fair       Standing balance-Leahy Scale: Fair                      Cognition Arousal/Alertness: Awake/alert Behavior During Therapy: WFL for tasks assessed/performed Overall Cognitive Status: Within  Functional Limits for tasks assessed                      Exercises      General Comments        Pertinent Vitals/Pain VSS including LVAD parameters; SaO2 95% on room air at rest, 92% with activity  Pain Assessment: No/denies pain    Home Living                      Prior Function            PT Goals (current goals can now be found in the care plan section) Acute Rehab PT Goals Patient Stated Goal: to go home today Time For Goal Achievement: 07/28/16 Progress towards PT goals: Progressing toward goals    Frequency    Min 3X/week      PT Plan Current plan remains appropriate    Co-evaluation             End of Session Equipment Utilized During Treatment: Gait belt Activity Tolerance: Patient limited by fatigue Patient left: in chair;with call bell/phone within reach (pt asked to remain on his batteries (to make using BR easier)     Time: [id number] PT Time Calculation (min) (ACUTE ONLY): 34 min  Charges:  $Gait Training: 23-37 mins  G Codes:      Kyrollos Cordell 07/23/2016, 2:06 PM Pager [phone]

## 2016-07-23 NOTE — Progress Notes (Signed)
Order for discharge acknowledged, pt informed and verbalized understanding. PM meds ( Levaquin and coumadin) both given. Discharge instructions explained and given to pt with girlfriend at bedside. Both verbalized understanding. Belongings packed, Pt transported via w/c with belongings , accompanied by girlfriend. Elink and CCMD both notified.

## 2016-07-23 NOTE — Progress Notes (Signed)
Patient ID: [name], male   DOB: Jun 16, 1941, 75 y.o.   MRN: [id number]    HeartMate 2 Rounding Note  Subjective:    Admitted 07/18/16 with high fever and productive cough.  ? CAP but CXR clear. Though most likely acute bronchitis vs atypical PNA. Started on Vanc and given dose of Zosyn in ED.  Now narrowed to IV levofloxacin.     BCx NGTD. Sputum culture inconclusive ( Upper respiratory)    S/p DCCV 07/19/16 for V-Tach.  Noted to be in rapid atrial flutter 9/15.  Unable to pace terminate.  However, he converted back to NSR 9/16 early am on amiodarone gtt.  Remains in NSR this morning. A-paced ~ 60 bpm.    Got Lasix 40 mg IV BID yesterday with mild diuresis.  Afebrile.  Feeling much better today. Walking the halls with PT without difficulty.   LDH 256 -> 273 -> 259 -> 278 -> 256 INR 2.38 -> 2.96 -> 3 -> 3 -> 2.45 WBC 13.6 -> 6.4 -> 4.5 -> 5.2  LVAD INTERROGATION:  Flow 3.9 liters/min, speed 9000, power 4.6, PI 7.2. No PI events.   Objective:    Vital Signs:   Temp:  [98 F (36.7 C)-98.7 F (37.1 C)] 98.1 F (36.7 C) (09/18 0337) Pulse Rate:  [59-66] 64 (09/18 0337) Resp:  [10-17] 12 (09/18 0337) BP: (90-101)/(80-86) 101/84 (09/17 1600) SpO2:  [94 %-100 %] 94 % (09/18 0337) Weight:  [201 lb (91.2 kg)] 201 lb (91.2 kg) (09/18 0337) Last BM Date: 07/19/16 Mean arterial Pressure 80-90s  Intake/Output:   Intake/Output Summary (Last 24 hours) at 07/23/16 0750 Last data filed at 07/23/16 0300  Gross per 24 hour  Intake            450.1 ml  Output             1525 ml  Net          -1074.9 ml     Physical Exam: General:  Weak appearing. NAD HEENT: normal Neck: supple. JVP 7-8 cm. R CEA scar Carotids 2+ bilat; no bruits. No thyromegaly or nodule noted.  Cor: Mechanical heart sounds with LVAD hum present. Lungs: Mildly diminished basilar sounds.   Abdomen: soft, NT, ND, no HSM. No bruits or masses. +BS  Driveline: C/D/I; securement device intact and driveline  incorporated Extremities: no cyanosis, clubbing, rash. Trace to 1+ ankle edema bilaterally.  Neuro: alert & orientedx3, cranial nerves grossly intact. moves all 4 extremities w/o difficulty. Affect pleasant  Telemetry: Reviewed, NSR - A paced ~60s  Labs: Basic Metabolic Panel:  Recent Labs Lab 07/19/16 0422 07/20/16 0328 07/21/16 0337 07/22/16 0114 07/23/16 0300  NA 138 140 140 137 139  K 4.0 3.7 3.4* 4.1 3.9  CL 109 107 105 102 103  CO2 23 23 27 26 29   GLUCOSE 138* 118* 109* 117* 99  BUN 32* 28* 20 22* 16  CREATININE 1.78* 1.46* 1.24 1.41* 1.30*  CALCIUM 8.7* 8.5* 8.6* 8.7* 8.5*    Liver Function Tests:  Recent Labs Lab 07/19/16 0422 07/20/16 0328 07/21/16 0337 07/22/16 0114 07/23/16 0300  AST 53* 63* 59* 63* 51*  ALT 25 30 32 40 40  ALKPHOS 91 79 76 88 81  BILITOT 1.0 1.0 0.8 0.5 0.8  PROT 6.1* 6.0* 5.7* 5.9* 5.9*  ALBUMIN 3.6 3.4* 3.1* 3.3* 3.2*   No results for input(s): LIPASE, AMYLASE in the last 168 hours. No results for input(s): AMMONIA in the last 168 hours.  CBC:  Recent Labs Lab 07/18/16 2247 07/20/16 0328 07/21/16 0337 07/22/16 0114 07/23/16 0300  WBC 13.6* 6.4 4.5 5.3 5.2  NEUTROABS 11.4*  --   --   --   --   HGB 11.4* 9.5* 9.7* 10.1* 10.1*  HCT 36.1* 29.9* 30.7* 32.0* 32.1*  MCV 99.4 99.0 98.7 99.1 98.2  PLT 129* 97* 101* 109* 129*    INR:  Recent Labs Lab 07/19/16 0422 07/20/16 0328 07/21/16 0337 07/22/16 0114 07/23/16 0300  INR 2.38 2.96 3.01 3.02 2.45    Other results:  EKG:   Imaging: No results found.   Medications:     Scheduled Medications: . amiodarone  400 mg Oral Daily  . atorvastatin  80 mg Oral Daily  . clopidogrel  75 mg Oral Daily  . docusate sodium  100 mg Oral Daily  . levofloxacin (LEVAQUIN) IV  500 mg Intravenous Q24H  . levothyroxine  300 mcg Oral Q Mon   And  . levothyroxine  200 mcg Oral Once per day on Sun Tue Wed Thu Fri Sat  . pantoprazole  40 mg Oral BID  . ranolazine  500 mg Oral  BID  . sodium chloride flush  3 mL Intravenous Q12H  . Warfarin - Pharmacist Dosing Inpatient   Does not apply q1800    Infusions:    PRN Medications: sodium chloride, acetaminophen, benzonatate, ondansetron (ZOFRAN) IV, sodium chloride flush   Assessment:   1. Fever and productive cough -> acute bronchitis 2. 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 PAF/AFL with RVR s/p TEE dccv on 01/19/16 4. CKD stage III 5. 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 Now on Plavix and warfarin at home.  6. R internal carotid stenosis - s/p R CEA 01/13/16 - Had f/u 3/28. Stable. 7. Lumbar compression fracture - s/p laminectomy 3/17 8. VT -DCCV 9/14.  9. Hypothyroidism. 10. Atrial flutter - Unable to pace terminate on 9/15 but later converted to NSR on amiodarone gtt.   Plan/Discussion:    Admitted with high fever.  CXR clear.  Acute bronchitis vs atypical PNA.  Sputum Culture inconclusive.  Blood cultures NGTD.  Working ABX narrowed to [employer], continue.  Afebrile, WBCs down.    Remains in NSR this am. (was in rapid atrial flutter Friday afternoon). Converted to po amio 07/22/16. 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 With both VT and Aflut/RVR this admission, need to discuss with EP but amiodarone with close attention to thyroid may be best long-term option.  Remains on ranolazine.   Today is day 5 of Levaquin.     MAP 90s today.  Dyspnea improved.   I reviewed the LVAD parameters from today, and compared the results to the patient's prior recorded data.  No programming changes were made.  The LVAD is functioning within specified parameters.  The patient performs LVAD self-test daily.  LVAD interrogation was negative for any significant power changes, alarms or PI events/speed drops.  LVAD equipment check completed and is in good working order.  Back-up equipment present.   LVAD education done on emergency procedures and precautions and reviewed exit site care.  Length of  Stay: 4  [name] 07/23/2016, 7:50 AM  VAD Team --- VAD ISSUES ONLY--- Pager [phone] (7am - 7am)  Advanced Heart Failure Team  Pager [phone] (M-F; 7a - 4p)  Please contact [facility] for night-coverage after hours (4p -7a ) and weekends on [email or web address]  Patient seen with PA,  agree with the above note.  He is doing well, diuresed yesterday. No complaints this morning.  He is a-paced at 75.    I am going to continue him on amiodarone.  Will have to follow thyroid closely.  He will take 400 mg daily x 7 days then 200 mg daily after that.    Will get ECG prior to discharge to make sure that QTc is not markedly prolonged.   Home today, followup LVAD clinic, needs INR followup.  Meds for home: levofloxacin for total of 7 days for bronchitis, amiodarone 400 mg daily x 7 days (2 more days) then 200 daily, atorvastatin 80, Plavix 75, warfarin, Ranexa 500 bid, Lasix 20 prn, levoxyl per home dosing.  Will need to follow TSH closely.   [name] 07/23/2016 9:02 AM

## 2016-07-23 NOTE — Progress Notes (Signed)
ANTICOAGULATION CONSULT NOTE - Follow Up Consult  Pharmacy Consult for Coumadin Indication: LVAD  Assessment: 75yo male admitted w/ bronchitis to continue Coumadin for VAD. INR on admit at goal 2.4   INR today is down within therapeutic range at 2.4. Note held doses on this weekend. Noted DDI with IV Amio and Levaquin, will monitor INR closely at discharge. Hgb and pltc remain low but improved. No bleeding noted.   Home dose: 4 mg daily except for 2 mg on Tues/Sat. Last PTA dose 07/18/2016.   Goal of Therapy:  INR 2.0-2.5  Plan:  1. Warfarin 2mg  tonight, at discharge plan will be to continue home regimen excluding today and recheck INR Friday with home monitor.   Patient Measurements: Height: 6' (182.9 cm) Weight: 201 lb (91.2 kg) IBW/kg (Calculated) : 77.6   Vital Signs: Temp: 97.2 F (36.2 C) (09/18 0819) Temp Source: Oral (09/18 0819) BP: 101/84 (09/18 0819) Pulse Rate: 64 (09/18 1200)  Labs:  Recent Labs  07/21/16 0337 07/22/16 0114 07/23/16 0300  HGB 9.7* 10.1* 10.1*  HCT 30.7* 32.0* 32.1*  PLT 101* 109* 129*  LABPROT 31.9* 31.9* 27.1*  INR 3.01 3.02 2.45  CREATININE 1.24 1.41* 1.30*    Estimated Creatinine Clearance: 53.1 mL/min (by C-G formula based on SCr of 1.3 mg/dL (H)).  Erin Hearing PharmD., BCPS Clinical Pharmacist Pager [phone] 07/23/2016 12:33 PM

## 2016-07-23 NOTE — Discharge Summary (Signed)
Advanced Heart Failure Team  Discharge Summary   Patient ID: [name] MRN: [id number], DOB/AGE: Jul 17, 1941 75 y.o. Admit date: 07/18/2016 D/C date:     07/23/2016   Primary Discharge Diagnoses:   1. Fever and productive cough -> acute bronchitis 2. 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 PAF/AFL with RVR s/p TEE dccv on 01/19/16 4. CKD stage III 5. H/o GIB  6. R internal carotid stenosis  7. Lumbar compression fracture 8. VT 9. Hypothyroidism. 10. Recurrent Atrial flutter    Hospital Course:   [name] is a 75 yo male with h/o VT, PAD and severe systolic HF (EF 0000000) due to mixed ischemic/nonischemic CM he is s/p HM II LVAD implant for destination therapy on October 19, 2013.   Presented to ED 07/18/16 with Fever 103. CXR clear. Urine dark. BCx drawn and started on vanc/zosyn for presumed sepsis. UA with rare bacteria, large Hgb, and negative bilirubin. Later narrowed to Levaquin, as thought to be Acute bronchitis vs atypical pneumonia. CBC elevated but trended down on ABX.    Pt also noted to be in VT proven by Medtronic interrogation.  He was unable to be paced out, so underwent DCCV 07/19/16 with prompt resumption of NSR. Pt also started IV amiodarone for anti-arrythmia.   Pt had no further fever and remained stable on po meds.  He did require several doses of IV lasix with arrhythmias lending him towards volume overload.   Unfortunately, patient went into coarse AF with atrial pacing on 07/20/16 after Controller change. Again unable to be paced out, but converted overnight with re-bolus of IV amio and maintained on amio infusion overnight.  By mouth amiodarone titrated up as well and planned for home taper.   Blood Cx negative to date. Sputum culture inconclusive (upper respiratory)  Seen on am of 07/23/16 and thought stable for discharge with full course of Levaquin to be continued at home.  He will also continue amiodarone 400 mg daily x 2 days further at home,  and then remain at 200 mg daily until titrated down by HF clinic.      INR on lower end of normal on 07/23/16, adjusted for amio and levaquin.  He will have recheck INR sent from home monitoring on 07/27/16 and full clinic visit 07/30/16.   He is stable for discharge to home with close follow up as below.   LVAD INTERROGATION:  Flow 3.9 liters/min, speed 9000, power 4.6, PI 7.2. No PI events.   Discharge Weight Range: 201 lb Discharge Vitals: Blood pressure 101/84, pulse 64, temperature 98.2 F (36.8 C), temperature source Oral, resp. rate 11, height 6' (1.829 m), weight 201 lb (91.2 kg), SpO2 97 %.  Labs: Lab Results  Component Value Date   WBC 5.2 07/23/2016   HGB 10.1 (L) 07/23/2016   HCT 32.1 (L) 07/23/2016   MCV 98.2 07/23/2016   PLT 129 (L) 07/23/2016     Recent Labs Lab 07/23/16 0300  NA 139  K 3.9  CL 103  CO2 29  BUN 16  CREATININE 1.30*  CALCIUM 8.5*  PROT 5.9*  BILITOT 0.8  ALKPHOS 81  ALT 40  AST 51*  GLUCOSE 99   Lab Results  Component Value Date   CHOL 106 11/05/2015   HDL 33 (L) 11/05/2015   LDLCALC 60 11/05/2015   TRIG 67 11/05/2015   BNP (last 3 results)  Recent Labs  09/22/15 1330 10/07/15 1125 07/18/16 2253  BNP 707.6* 491.6* 1,289.7*  ProBNP (last 3 results) No results for input(s): PROBNP in the last 8760 hours.   Diagnostic Studies/Procedures   No results found.  Discharge Medications     Medication List    STOP taking these medications   amLODipine 10 MG tablet Commonly known as:  NORVASC     TAKE these medications   amiodarone 200 MG tablet Commonly known as:  PACERONE Take 400 mg (2 tabs) x 2 days, and then take 200 mg daily or as directed by HF clinic. What changed:  how much to take  how to take this  when to take this  additional instructions   atorvastatin 80 MG tablet Commonly known as:  LIPITOR Take 80 mg by mouth daily at 6 PM.   benzonatate 100 MG capsule Commonly known as:  TESSALON Take 1  capsule (100 mg total) by mouth 3 (three) times daily as needed for cough.   clopidogrel 75 MG tablet Commonly known as:  PLAVIX Take 1 tablet (75 mg total) by mouth daily.   docusate sodium 100 MG capsule Commonly known as:  COLACE Take 100 mg by mouth daily as needed for mild constipation.   furosemide 20 MG tablet Commonly known as:  LASIX Take 1 tablet (20 mg total) by mouth as needed for edema (weight gain).   ICAPS PO Take 1 tablet by mouth 2 (two) times daily.   levofloxacin 500 MG tablet Commonly known as:  LEVAQUIN Take 1 tablet (500 mg total) by mouth daily. Take 07/24/16 and 07/25/16 to finish course started in hospital. Start taking on:  07/24/2016   levothyroxine 200 MCG tablet Commonly known as:  SYNTHROID, LEVOTHROID Take one 200 mcg daily before breakfast every day with 1 1/2 (300 mcg) every Monday. What changed:  how much to take  how to take this  when to take this  additional instructions   metoprolol succinate 25 MG 24 hr tablet Commonly known as:  TOPROL-XL Take 1 tablet (25 mg total) by mouth 2 (two) times daily.   nitroGLYCERIN 0.4 MG SL tablet Commonly known as:  NITROSTAT Place 0.4 mg under the tongue every 5 (five) minutes as needed for chest pain.   pantoprazole 40 MG tablet Commonly known as:  PROTONIX Take 1 tablet (40 mg total) by mouth 2 (two) times daily.   ranolazine 500 MG 12 hr tablet Commonly known as:  RANEXA TAKE 1 TABLET (500 MG TOTAL) BY MOUTH 2 (TWO) TIMES DAILY.   warfarin 2 MG tablet Commonly known as:  COUMADIN Take 2-4 mg by mouth daily. Takes 2mg  on tues and sat  takes 4mg  all other days   zinc gluconate 50 MG tablet Take 50 mg by mouth 2 (two) times daily.       Disposition   The patient will be discharged in stable condition to home. Discharge Instructions    (HEART FAILURE PATIENTS) Call MD:  Anytime you have any of the following symptoms: 1) 3 pound weight gain in 24 hours or 5 pounds in 1 week 2)  shortness of breath, with or without a dry hacking cough 3) swelling in the hands, feet or stomach 4) if you have to sleep on extra pillows at night in order to breathe.    Complete by:  As directed    Diet - low sodium heart healthy    Complete by:  As directed    Heart Failure patients record your daily weight using the same scale at the same time of day  Complete by:  As directed    Increase activity slowly    Complete by:  As directed      Follow-up Information    Glori Bickers, MD Follow up on 07/30/2016.   Specialty:  Cardiology Why:  at Monday 07/30/16 at 1100 for post hospital visit. Please bring all of your medications to your visit. The code for parking is 0030. Contact information: [address] [place] [address] [phone]             Duration of Discharge Encounter: Greater than 35 minutes   Signed, Annamaria Helling 07/23/2016, 1:09 PM

## 2016-07-24 LAB — CULTURE, BLOOD (ROUTINE X 2)
Culture: NO GROWTH
Culture: NO GROWTH

## 2016-07-26 ENCOUNTER — Other Ambulatory Visit (HOSPITAL_COMMUNITY): Payer: Medicare Other

## 2016-07-26 ENCOUNTER — Encounter (HOSPITAL_COMMUNITY): Payer: Medicare Other

## 2016-07-27 ENCOUNTER — Ambulatory Visit (HOSPITAL_COMMUNITY): Payer: Self-pay | Admitting: Infectious Diseases

## 2016-07-27 LAB — POCT INR: INR: 2.4

## 2016-07-30 ENCOUNTER — Ambulatory Visit (HOSPITAL_COMMUNITY)
Admit: 2016-07-30 | Discharge: 2016-07-30 | Disposition: A | Discharge status: Home / Self Care | Payer: Medicare Other | Attending: Internal Medicine | Admitting: Internal Medicine

## 2016-07-30 ENCOUNTER — Ambulatory Visit (HOSPITAL_COMMUNITY): Payer: Self-pay | Admitting: *Deleted

## 2016-07-30 ENCOUNTER — Other Ambulatory Visit (HOSPITAL_COMMUNITY): Payer: Self-pay | Admitting: *Deleted

## 2016-07-30 VITALS — BP 115/88 | HR 78 | Ht 72.0 in | Wt 205.6 lb

## 2016-07-30 DIAGNOSIS — I2510 Atherosclerotic heart disease of native coronary artery without angina pectoris: Secondary | ICD-10-CM | POA: Insufficient documentation

## 2016-07-30 DIAGNOSIS — J40 Bronchitis, not specified as acute or chronic: Secondary | ICD-10-CM

## 2016-07-30 DIAGNOSIS — G4730 Sleep apnea, unspecified: Secondary | ICD-10-CM | POA: Insufficient documentation

## 2016-07-30 DIAGNOSIS — E032 Hypothyroidism due to medicaments and other exogenous substances: Secondary | ICD-10-CM | POA: Diagnosis not present

## 2016-07-30 DIAGNOSIS — Z85820 Personal history of malignant melanoma of skin: Secondary | ICD-10-CM | POA: Diagnosis not present

## 2016-07-30 DIAGNOSIS — I5022 Chronic systolic (congestive) heart failure: Secondary | ICD-10-CM | POA: Diagnosis present

## 2016-07-30 DIAGNOSIS — I739 Peripheral vascular disease, unspecified: Secondary | ICD-10-CM | POA: Insufficient documentation

## 2016-07-30 DIAGNOSIS — E785 Hyperlipidemia, unspecified: Secondary | ICD-10-CM | POA: Insufficient documentation

## 2016-07-30 DIAGNOSIS — Z8673 Personal history of transient ischemic attack (TIA), and cerebral infarction without residual deficits: Secondary | ICD-10-CM | POA: Diagnosis not present

## 2016-07-30 DIAGNOSIS — I255 Ischemic cardiomyopathy: Secondary | ICD-10-CM | POA: Insufficient documentation

## 2016-07-30 DIAGNOSIS — Z95811 Presence of heart assist device: Secondary | ICD-10-CM | POA: Diagnosis not present

## 2016-07-30 DIAGNOSIS — I480 Paroxysmal atrial fibrillation: Secondary | ICD-10-CM | POA: Insufficient documentation

## 2016-07-30 DIAGNOSIS — I471 Supraventricular tachycardia: Secondary | ICD-10-CM | POA: Diagnosis not present

## 2016-07-30 DIAGNOSIS — I252 Old myocardial infarction: Secondary | ICD-10-CM | POA: Insufficient documentation

## 2016-07-30 DIAGNOSIS — R05 Cough: Secondary | ICD-10-CM | POA: Diagnosis not present

## 2016-07-30 DIAGNOSIS — Z7901 Long term (current) use of anticoagulants: Secondary | ICD-10-CM

## 2016-07-30 DIAGNOSIS — E039 Hypothyroidism, unspecified: Secondary | ICD-10-CM | POA: Diagnosis not present

## 2016-07-30 DIAGNOSIS — I130 Hypertensive heart and chronic kidney disease with heart failure and stage 1 through stage 4 chronic kidney disease, or unspecified chronic kidney disease: Secondary | ICD-10-CM | POA: Insufficient documentation

## 2016-07-30 DIAGNOSIS — J209 Acute bronchitis, unspecified: Secondary | ICD-10-CM | POA: Insufficient documentation

## 2016-07-30 DIAGNOSIS — Z9889 Other specified postprocedural states: Secondary | ICD-10-CM | POA: Insufficient documentation

## 2016-07-30 DIAGNOSIS — M1990 Unspecified osteoarthritis, unspecified site: Secondary | ICD-10-CM | POA: Diagnosis not present

## 2016-07-30 DIAGNOSIS — M4856XA Collapsed vertebra, not elsewhere classified, lumbar region, initial encounter for fracture: Secondary | ICD-10-CM | POA: Insufficient documentation

## 2016-07-30 DIAGNOSIS — Z87442 Personal history of urinary calculi: Secondary | ICD-10-CM | POA: Diagnosis not present

## 2016-07-30 DIAGNOSIS — Z79899 Other long term (current) drug therapy: Secondary | ICD-10-CM

## 2016-07-30 DIAGNOSIS — R059 Cough, unspecified: Secondary | ICD-10-CM

## 2016-07-30 LAB — BASIC METABOLIC PANEL
ANION GAP: 5 (ref 5–15)
BUN: 27 mg/dL — ABNORMAL HIGH (ref 6–20)
CO2: 26 mmol/L (ref 22–32)
Calcium: 9.4 mg/dL (ref 8.9–10.3)
Chloride: 106 mmol/L (ref 101–111)
Creatinine, Ser: 1.34 mg/dL — ABNORMAL HIGH (ref 0.61–1.24)
GFR calc Af Amer: 58 mL/min — ABNORMAL LOW (ref >60)
GFR calc non Af Amer: 50 mL/min — ABNORMAL LOW (ref >60)
GLUCOSE: 91 mg/dL (ref 65–99)
POTASSIUM: 4.1 mmol/L (ref 3.5–5.1)
Sodium: 137 mmol/L (ref 135–145)

## 2016-07-30 LAB — CBC
HEMATOCRIT: 36.4 % — AB (ref 39.0–52.0)
HEMOGLOBIN: 11.7 g/dL — AB (ref 13.0–17.0)
MCH: 31.3 pg (ref 26.0–34.0)
MCHC: 32.1 g/dL (ref 30.0–36.0)
MCV: 97.3 fL (ref 78.0–100.0)
Platelets: 214 10*3/uL (ref 150–400)
RBC: 3.74 MIL/uL — ABNORMAL LOW (ref 4.22–5.81)
RDW: 15 % (ref 11.5–15.5)
WBC: 7.8 10*3/uL (ref 4.0–10.5)

## 2016-07-30 LAB — PROTIME-INR
INR: 1.81
Prothrombin Time: 21.3 seconds — ABNORMAL HIGH (ref 11.4–15.2)

## 2016-07-30 LAB — T4, FREE: FREE T4: 1.45 ng/dL — AB (ref 0.61–1.12)

## 2016-07-30 LAB — LACTATE DEHYDROGENASE: LDH: 294 U/L — ABNORMAL HIGH (ref 98–192)

## 2016-07-30 LAB — TSH: TSH: 2.494 u[IU]/mL (ref 0.350–4.500)

## 2016-07-30 MED ORDER — DOXYCYCLINE HYCLATE 50 MG PO CAPS: 100.0000 mg | ORAL_CAPSULE | Freq: Two times a day (BID) | ORAL | 0 refills | Status: DC

## 2016-07-30 MED ORDER — BENZONATATE 100 MG PO CAPS: 100.0000 mg | ORAL_CAPSULE | Freq: Three times a day (TID) | ORAL | 1 refills | Status: DC | PRN

## 2016-07-30 NOTE — Patient Instructions (Addendum)
1.  Start Doxy 100 mg twice daily for 10 days. 2.  Increase Coumadin to 4 mg daily; re- check INR Thursday. 3. Return to [facility] on 08/21/16 at 10:00 am.

## 2016-07-30 NOTE — Progress Notes (Signed)
Presents today with [name] (significant other).   Symptom  Yes  No  Details   Angina       x Activity:   Claudication        x How far:   Syncope        x When:     Stroke        x       TIA  2001, TIA 10/2015  Orthopnea         x How many pillows: 1  PND         x How often:  CPAP       N/A How many hrs:   Pedal edema               x   Abd fullness               x    N&V               x Good appetite  Diaphoresis               x   Bleeding              x   Urine color        yellow  SOB        x      Activity: "all the time" with wheezing; dry cough unimproved   Palpitations        x When:   ICD shock        x   Hospitlizaitons               x When/where/why:   ED visit        x When/where/why:  Other MD              x   Activity        Unlimited (sedentary by choice)  Fluid    < 2 liters over last few days  Diet    Low salt food   Vital signs: HR: 60 Doppler modified systolic:  123456 Auto cuff: 115/88 (95) O2 Sat: 95% Wt: 205.6  lbs  Last wt: 206.8 lbs Ht: 6'0"  LVAD interrogation reveals:  Speed: 9000 Flow: 3.5 Power: 4.4w PI: 6.1  Alarms: None Events: 5 - 10 PI events daily  Fixed speed:  9000 Low speed limit:  8400  Primary Controller: Replace back up battery in 24 months   Back up controller: Replace back up battery in 24 months   LVAD interrogated and functioning as expected. Interrogation Negative for elevated flows or powers; drop in PI to 4.5 - 5.0 and flow to 3.0 periodically; pt does report frequent coughing episodes. Pt denies orthostatic symptoms. 5 - 10 PI events daily with no VAD alarms noted.   Driveline Site: Drive line site fully healed and incorporated. Velour is fully implanted at the site. [name] is performing weekly dressing changes using Sorbaview dressings with Biopatch on exit site. Pt/caregiver provided with 4 weekly dressing kits.   Encounter Details: Pt still c/o chest congestion, wheezing, and chronic non-productive cough; denies  fever or chills.  EKG obtained and reviewed per Dr. Haroldine Laws; junctional rhythm at rate 59 - 60.  Optivol obtained and reviewed per Dr. Valrie Hart - no VT or atrial arrhythmias noted since discharge.   MAP 95 on current regimen; Amlodipine stopped during hospitalization. Doppler reflecting modified systolic.   LABS: INR  1.8 --> Goal 2 - 2.5, no  ASA, 75 mg Plavix. See anticoag note. Completed Levaquin 07/25/16; decreased PO Amiodarone from 400 mg to 200 mg daily on 07/25/16.  Will start Doxy 100 bid for 10 days.  Hgb 10.1 ---> 11.7  WBC 5.2 ---> 7.8   Thyroid panel pending - follow closely since increased dose of amiodarone required; hx of amiodarone induced hypothyroidism.  Patient Instructions: 1.  Start Doxy 100 mg twice daily for 10 days. 2.  Increase Coumadin to 4 mg daily; re- check INR Thursday. 3. Return to [facility] clinic on 08/21/16 at 10:00 am.   Zada Girt, RN [place] Coordinator  Office: [phone] 24/7 VAD Pager: [phone]

## 2016-07-30 NOTE — Progress Notes (Signed)
Patient ID: [name], male   DOB: 13-Dec-1940, 75 y.o.   MRN: [id number]   VAD CLINIC PROGRESS NOTE  PCP: Dr. Kandice Robinsons ([place]) GU: Dr Risa Grill  Neuro Surgeon: Dr Vertell Limber Vascular: Dr Kellie Simmering Primary HF: Dr Haroldine Laws  [name] is a 75 yo male with h/o VT, PAD and severe systolic HF (EF 0000000) due to mixed ischemic/nonischemic CM he is s/p HM II LVAD implant for destination therapy on October 19, 2013. On 09/01/14 underwent cystoscopy and flexible ureteroscopy with lithotripsy and basket extraction.   VAD implantation was complicated by VT, syncope, A fib/Aflutter and persistent low output felt to be due to RHF. Required short term milrinone.   GU Event 09/2015 Hematuria and chronic nephrolithiasis. Renal US showed mild hyronephrosis and bilateral renal cysts. Dr. Risa Grill took back for repeat lithotripsy and stone extraction by ureteroscopy and J stent placement.  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  01/2016 R CEA. He was started on Plavix.  01/2016 Lumbar fusion L3-L5 with pedicle screws posterolateral arthrodesis with BMP, allograft  Admitted 9/17 with high fevers and productive cough. CXR negative. Treated with levaquin x 7 days for acute bronchitis. Also found to have VT and underwent DC-CV. Seen by EP and amio increased  Follow up LVAD Clinic: Presents for LVAD follow up. Continues to have cough. Occasionally productive but mostly non-productive. Feels like he has been kicked by a horse. No fevers or chills. Denies SOB/PND/Orthopnea. No palpitations. Weight at home is relatively stable. Takes lasix as needed. Good appetite but not drinking much Taking all medications.No bleeding, dark stools, neuro symptoms or dark urine.     LVAD interrogation reveals:  Speed: 9000 Flow: 3.5 Power: 4.4w PI: 6.1  Alarms: None Events: 5 - 10 PI events daily  Fixed speed:   9000 Low speed limit:  8400  Primary Controller: Replace back up battery in 24 months   Back up controller: Replace back up battery in  months   LVAD interrogated and functioning as expected. Interrogation Negative for elevated flows or powers; drop in PI to 4.5 - 5.0 and flow to 3.0 with sitting up. Pt denies orthostatic symptoms. 5 - 10 PI events daily.    Past Medical History:  Diagnosis Date  . Arthritis   . Automatic implantable cardioverter-defibrillator in situ    a. s/p Medtronic Evera device serial [id number]    . Bradycardia   . CAD (coronary artery disease)    a. s/p MI 1982;  s/p DES to CFX 2011;  s/p NSTEMI 4/12 in setting of VTach;  cath 7/12:  LM ok, LAD mid 30-40%, Dx's with 40%, mCFX stent patent, prox to mid RCA occluded with R to R and L to R collats.  Medical Tx was continued  . Chronic systolic heart failure ([place])    a. s/p LVAD 10/2013 for destination therapy  . CKD (chronic kidney disease)   . CVD (cerebrovascular disease)    a. s/p L CEA 2008  . Cytopenia   . Dysrhythmia    AFIB  . History of GI bleed    a. on ASA- started on plavix during 10/2015 admission for CVA  . HLD (hyperlipidemia)   . HTN (hypertension)   . Hypothyroidism   . Inappropriate therapy from implantable cardioverter-defibrillator    a. ATP for sinus tach SVT wavelet identified SVT but was no  passive (2014)  . Ischemic cardiomyopathy   . 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 L Ear   . Myocardial infarction ([place])    1992  . PAF (paroxysmal atrial fibrillation) ([place])   . PVD (peripheral vascular disease) ([place])    a. 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 Shortness of breath dyspnea    W/ EXERTION   . Sleep apnea    NO CPAP NOW  HE SENT IT BACK YRS AGO  . Stroke [facility])    a. 10/2015 admission   . Ventricular tachycardia ([place])    a. s/p AICD;   h/o ICD shock;  Amiodarone Rx. S/P ablation in 2012     Current Outpatient Prescriptions  Medication Sig Dispense Refill  . amiodarone (PACERONE) 200 MG tablet Take 200 mg by mouth daily.    [name] atorvastatin (LIPITOR) 80 MG tablet Take 80 mg by mouth daily at 6 PM.     . benzonatate (TESSALON) 100 MG capsule Take 1 capsule (100 mg total) by mouth 3 (three) times daily as needed for cough. 21 capsule 0  . clopidogrel (PLAVIX) 75 MG tablet Take 1 tablet (75 mg total) by mouth daily. 90 tablet 3  . docusate sodium (COLACE) 100 MG capsule Take 100 mg by mouth daily as needed for mild constipation.     . metoprolol succinate (TOPROL-XL) 25 MG 24 hr tablet Take 1 tablet (25 mg total) by mouth 2 (two) times daily. 180 tablet 3  . Multiple Vitamins-Minerals (ICAPS PO) Take 1 tablet by mouth 2 (two) times daily.    . pantoprazole (PROTONIX) 40 MG tablet Take 1 tablet (40 mg total) by mouth 2 (two) times daily. 60 tablet 6  . ranolazine (RANEXA) 500 MG 12 hr tablet TAKE 1 TABLET (500 MG TOTAL) BY MOUTH 2 (TWO) TIMES DAILY. 60 tablet 6  . warfarin (COUMADIN) 2 MG tablet Take 2-4 mg by mouth daily. Takes 2mg  on tues and sat  takes 4mg  all other days    . zinc gluconate 50 MG tablet Take 50 mg by mouth 2 (two) times daily.     . furosemide (LASIX) 20 MG tablet Take 1 tablet (20 mg total) by mouth as needed for edema (weight gain). (Patient not taking: Reported on 07/30/2016) 30 tablet 3  . levothyroxine (SYNTHROID, LEVOTHROID) 200 MCG tablet Take one 200 mcg daily before breakfast every day with 1 1/2 (300 mcg) every Monday. (Patient taking differently: Take 200-300 mcg by mouth daily before breakfast. Take 1 tablet (232mcg) all days except on Mondays take 1&1/2 tablets (344mcg)) 45 tablet 6  . nitroGLYCERIN (NITROSTAT) 0.4 MG SL tablet Place 0.4 mg under the tongue every 5 (five) minutes as needed for chest pain.     No current facility-administered medications for this encounter.     Demerol [meperidine] and Opium  REVIEW OF SYSTEMS: All systems negative  except as listed in HPI, PMH and Problem list.   Vital signs: HR: 60 Doppler modified systolic:  123456 Auto cuff: 115/88 (95) O2 Sat: 95% Wt: 205.6  lbs  Last wt: 206.8 lbs Ht: 6'0"  Physical Exam: GENERAL: NAD. Ambulated in the clinic without difficulty  HEENT: normal NECK: Supple, JVP 6-7  no carotid bruits. R CEA scar well-healed No lymphadenopathy or thyromegaly appreciated.   CARDIAC:  Mechanical heart sounds with LVAD hum present.  LUNGS:  Crackles in right base ABDOMEN:  Soft, round, nontender, positive bowel sounds x4.   LVAD exit site: well-healed and incorporated.  Dressing dry and intact.  No erythema or drainage.  Stabilization device present and accurately applied.  Driveline dressing is being changed weekly per sterile technique. EXTREMITIES:  Warm and dry, no cyanosis, clubbing, R and LLE trace edema  NEUROLOGIC:  Alert and oriented x 4.  Gait steady.  No aphasia.  No dysarthria.  Affect pleasant. Grossly normal bilateral upper and lower extremity strength.  Wound: lumbar wound healed  ASSESSMENT AND PLAN:  1. Chronic systolic HF: s/p LVAD 123XX123.  Remains NYHA II.   - NYHA II-III. Limited by orthopedic issues but improving. Volume status ok.     2.  Atrial arrhythmias: Atrial fibrillation, atrial tachycardia.  S/p DC-CV 06/04/14, DC-CV 03/02/15 and 01/19/16.  - Regular rhythm. Continue amio to 200 mg daily.   3. HTN: MAP stable.  4. LVAD: LVAD parameters reviewed personally. Stable.  5. CKD, stage III:  Recent creatinine ok. Recheck today.  6. Anticoagulation management: Goal INR 2-2.5. INR 2.0 today. He is off ASA with GI bleeding. Continue Plavix. Uses home monitor. 7. Nephrolithiasis s/p Renal Stone lithotripsy: Per Dr Risa Grill. Stable. 8. 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 Continue coumadin and plavix.(with h/o CVA). Recent CBC ok.  No bleeding problems currently.     9. Lumbar compression fracture: s/p L3-L5 fusion 02/03/16. He has full thickness wound  from hematoma.  Now healed. 10. 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  Continue atorvastatin. (also warfarin INR goal 2-2.5). S/p R CEA 01/13/16. Continue  plavix.  11. H/o amio-induced hypethyroidism: Followed by Dr. Forde Dandy. Amio restarted peri-operatively at low dose due to recurrent atrial arrhythmias. Recent TSH ok on 05/02/16  12. VT - underwent DC-CV 9/17. back on amio. Now quiescent.  13. Persistent cough/bronchitis - recently treated for acute bronchitis. Continues with persistent severe cough. RLL crackles. Will treat with doxy x 10 days. Add mucinex. Continue Tessalon Perles.    Frank Folmar,MD 11:36 AM

## 2016-07-31 LAB — T3, FREE: T3 FREE: 2 pg/mL (ref 2.0–4.4)

## 2016-08-02 ENCOUNTER — Ambulatory Visit (HOSPITAL_COMMUNITY): Payer: Self-pay | Admitting: Infectious Diseases

## 2016-08-02 LAB — POCT INR: INR: 2.1

## 2016-08-06 ENCOUNTER — Other Ambulatory Visit: Payer: Self-pay | Admitting: Internal Medicine

## 2016-08-08 ENCOUNTER — Ambulatory Visit (HOSPITAL_COMMUNITY): Payer: Self-pay | Admitting: Infectious Diseases

## 2016-08-08 LAB — POCT INR: INR: 3.4

## 2016-08-10 ENCOUNTER — Ambulatory Visit: Payer: Medicare Other | Admitting: Vascular Surgery

## 2016-08-10 ENCOUNTER — Encounter (HOSPITAL_COMMUNITY): Payer: Medicare Other

## 2016-08-10 ENCOUNTER — Other Ambulatory Visit (HOSPITAL_COMMUNITY): Payer: Medicare Other

## 2016-08-13 ENCOUNTER — Telehealth (HOSPITAL_COMMUNITY): Payer: Self-pay | Admitting: Infectious Diseases

## 2016-08-13 NOTE — Telephone Encounter (Signed)
Returned page to [employer] - he has not been feeling well today. Reports dizziness and diaphoresis that occurred when he went to kitchen from recliner to make lunch. Pre-syncopal, but made it to recliner so did not pass out. He has recently been treated for URI/PNA with Levaquin and he just completed Doxycycline yesterday. Denies any nausea/poor intake, diarrhea or vomiting; no coughing, URI symptoms, fevers (temp 95.1). VAD flows 3.2 - 3.6, PI 5.5 - 6.1. No clinical alarms noted.   Asked to send device transmission as I am suspicious he is back in afib having been recently cardioverted and dosed on Amiodarone IV and decreased outpatient dose about 10 days ago). No AFib/Flutter or Ventricular arrhythmias noted on remote transmission. Fluid on optivol appears to be at baseline.   PI is a little low for him as he usually does better > 6.5. Advised to drink 2 - 3 extra glasses of fluids and rest for today. Requested to call office should he need to be seen by an MD.   Janene Madeira, RN [place] Coordinator   Office: [phone] 24/7 Emergency VAD Pager: [phone]

## 2016-08-15 ENCOUNTER — Ambulatory Visit (HOSPITAL_COMMUNITY): Payer: Self-pay | Admitting: Infectious Diseases

## 2016-08-15 LAB — POCT INR: INR: 2.5

## 2016-08-20 ENCOUNTER — Ambulatory Visit (HOSPITAL_COMMUNITY)
Admission: RE | Admit: 2016-08-20 | Discharge: 2016-08-20 | Disposition: A | Discharge status: Home / Self Care | Payer: Medicare Other | Source: Ambulatory Visit | Attending: Internal Medicine | Admitting: Internal Medicine

## 2016-08-20 ENCOUNTER — Other Ambulatory Visit (HOSPITAL_COMMUNITY): Payer: Self-pay | Admitting: Infectious Diseases

## 2016-08-20 ENCOUNTER — Ambulatory Visit (HOSPITAL_COMMUNITY): Payer: Self-pay | Admitting: Infectious Diseases

## 2016-08-20 VITALS — BP 93/75

## 2016-08-20 DIAGNOSIS — Z8673 Personal history of transient ischemic attack (TIA), and cerebral infarction without residual deficits: Secondary | ICD-10-CM | POA: Diagnosis not present

## 2016-08-20 DIAGNOSIS — Z79899 Other long term (current) drug therapy: Secondary | ICD-10-CM | POA: Diagnosis not present

## 2016-08-20 DIAGNOSIS — N183 Chronic kidney disease, stage 3 (moderate): Secondary | ICD-10-CM | POA: Insufficient documentation

## 2016-08-20 DIAGNOSIS — I509 Heart failure, unspecified: Secondary | ICD-10-CM

## 2016-08-20 DIAGNOSIS — I130 Hypertensive heart and chronic kidney disease with heart failure and stage 1 through stage 4 chronic kidney disease, or unspecified chronic kidney disease: Secondary | ICD-10-CM | POA: Insufficient documentation

## 2016-08-20 DIAGNOSIS — I480 Paroxysmal atrial fibrillation: Secondary | ICD-10-CM

## 2016-08-20 DIAGNOSIS — I5022 Chronic systolic (congestive) heart failure: Secondary | ICD-10-CM | POA: Diagnosis present

## 2016-08-20 DIAGNOSIS — Z95811 Presence of heart assist device: Secondary | ICD-10-CM | POA: Diagnosis not present

## 2016-08-20 DIAGNOSIS — Z7901 Long term (current) use of anticoagulants: Secondary | ICD-10-CM

## 2016-08-20 DIAGNOSIS — I50810 Right heart failure, unspecified: Secondary | ICD-10-CM

## 2016-08-20 LAB — CBC
HCT: 36 % — ABNORMAL LOW (ref 39.0–52.0)
Hemoglobin: 11.8 g/dL — ABNORMAL LOW (ref 13.0–17.0)
MCH: 31.1 pg (ref 26.0–34.0)
MCHC: 32.8 g/dL (ref 30.0–36.0)
MCV: 94.7 fL (ref 78.0–100.0)
Platelets: 136 K/uL — ABNORMAL LOW (ref 150–400)
RBC: 3.8 MIL/uL — ABNORMAL LOW (ref 4.22–5.81)
RDW: 15.4 % (ref 11.5–15.5)
WBC: 5.1 K/uL (ref 4.0–10.5)

## 2016-08-20 LAB — PROTIME-INR
INR: 3.19
PROTHROMBIN TIME: 33.4 s — AB (ref 11.4–15.2)

## 2016-08-20 LAB — BASIC METABOLIC PANEL WITH GFR
Anion gap: 8 (ref 5–15)
BUN: 30 mg/dL — ABNORMAL HIGH (ref 6–20)
CO2: 22 mmol/L (ref 22–32)
Calcium: 8.9 mg/dL (ref 8.9–10.3)
Chloride: 106 mmol/L (ref 101–111)
Creatinine, Ser: 1.52 mg/dL — ABNORMAL HIGH (ref 0.61–1.24)
GFR calc Af Amer: 50 mL/min — ABNORMAL LOW
GFR calc non Af Amer: 43 mL/min — ABNORMAL LOW
Glucose, Bld: 103 mg/dL — ABNORMAL HIGH (ref 65–99)
Potassium: 4.4 mmol/L (ref 3.5–5.1)
Sodium: 136 mmol/L (ref 135–145)

## 2016-08-20 LAB — LACTATE DEHYDROGENASE: LDH: 266 U/L — ABNORMAL HIGH (ref 98–192)

## 2016-08-20 NOTE — Progress Notes (Signed)
Presents today with [name] (significant other).   Symptom  Yes  No  Details   Angina       x Activity:   Claudication        x How far:   Syncope        x When:     Stroke        x       TIA  2001, TIA 10/2015  Orthopnea         x How many pillows: 1  PND         x How often:  CPAP       N/A How many hrs:   Pedal edema      x          x   Abd fullness               x    N&V               x Good appetite  Diaphoresis               x   Bleeding              x Denies nosebleeds, no melena/BRBPR  Urine color        yellow  SOB        x      Activity: improved now that acute URI resolved. "Normal"   Palpitations        x When:   ICD shock        x   Hospitlizaitons               x When/where/why:   ED visit        x When/where/why:  Other MD              x   Activity        None, sedentary by choice. Mostly limited by back pain.   Fluid    < 2 liters over last few days  Diet    Low salt food   Vital signs: HR: 60 Doppler modified systolic: 123XX123 Auto cuff: [id number] (80) O2 Sat: 94 % Wt: 206 lbs  Last wt: 205.6 lbs Home Weights - 190 - 193 lbs.  Ht: 6'0"  LVAD interrogation reveals:  Speed: 9000 Flow: --- to 4.3 Power: 4.7w PI: 3.9 - 7.1 during encounter today  Alarms: one low flow on 08/14/16 @ 5:55 Events: 15 - 40 events a day now   Fixed speed:  9000 Low speed limit:  8400  Primary Controller: Replace back up battery in 24 months   Back up controller: Replace back up battery in 24 months   LVAD interrogated and functioning as expected. Interrogation Negative for elevated flows or powers; drop in PI to 3.9 - 7.1 and flow to 3.0 periodically; pt does report few coughing/gagging episodes but none that they feel like explains the increased PI events. Pt denies orthostatic symptoms. 20 - 40 PI events daily with no VAD alarms noted.   Driveline Site: Drive line site fully healed and incorporated. Velour is fully implanted at the site. [name] is performing weekly dressing changes  using Sorbaview dressings with Biopatch on exit site. Pt has adequate dressing supplies at home.   Encounter Details: Pt presents for FU after changing from Levaquin to Doxycycline with URI. Symptoms are now resolved. Still with some back pain that limits ability to exercise. Mostly spends time in recliner  at home. Denies any CP/Palpitations/orthopnea. Has not had lower extremity swelling like he normally does. One episode about a week ago where he called to report significant dizziness/weakness. Low Flow alarm recorded the next morning. He has had an increase in PI events since last visit 20 - 40 daily. Was set up to do a RAMP echo prior to recent hospitalization to which we found him to be in atrial fib and he was cardioverted in house. D/W Dr. Haroldine Laws and will set up for another RAMP in next week to evaluate.   Optivol obtained with increased PI events and reviewed per Dr. Haroldine Laws- no VT or atrial arrhythmias noted since discharge.   MAP 81 on current regimen. Doppler reflecting modified systolic.   LABS: INR  3.1 - Goal 2 - 2.5, no ASA, 75 mg Plavix. See anticoag note. Completed Levaquin 07/25/16; decreased PO Amiodarone from 400 mg to 200 mg daily on 07/25/16.  Will start Doxy 100 bid for 10 days.  Hgb 11.8 - stable and tolerating current anticoagulation regimen.   LDH 266 - stable within established baseline 250 - 300. Denies tea-colored urine.   Creatine 1.52 - baseline   No medication changes today pending lab work. RTC in 1 month with RAMP to be scheduled next week.   Janene Madeira, RN [facility] Coordinator   Office: [phone] 24/7 Emergency VAD Pager: [phone]

## 2016-08-20 NOTE — Progress Notes (Signed)
Patient ID: SYED ZALAR, male   DOB: 04-Jan-1941, 75 y.o.   MRN: [id number]   VAD CLINIC PROGRESS NOTE  PCP: Dr. Kandice Robinsons ([place]) GU: Dr Risa Grill  Neuro Surgeon: Dr Vertell Limber Vascular: Dr Kellie Simmering Primary HF: Dr Haroldine Laws  Nelton Nip is a 75 yo male with h/o VT, PAD and severe systolic HF (EF 0000000) due to mixed ischemic/nonischemic CM he is s/p HM II LVAD implant for destination therapy on October 19, 2013. On 09/01/14 underwent cystoscopy and flexible ureteroscopy with lithotripsy and basket extraction.   VAD implantation was complicated by VT, syncope, A fib/Aflutter and persistent low output felt to be due to RHF. Required short term milrinone.   GU Event 09/2015 Hematuria and chronic nephrolithiasis. Renal US showed mild hyronephrosis and bilateral renal cysts. Dr. Risa Grill took back for repeat lithotripsy and stone extraction by ureteroscopy and J stent placement.  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  01/2016 R CEA. He was started on Plavix.  01/2016 Lumbar fusion L3-L5 with pedicle screws posterolateral arthrodesis with BMP, allograft  Admitted 9/17 with high fevers and productive cough. CXR negative. Treated with levaquin x 7 days for acute bronchitis. Also found to have VT and underwent DC-CV. Seen by EP and amio increased  Follow up LVAD Clinic: Presents for LVAD follow up. Feels much better after treatment of URI. Not overly active but denies CP or SOB. Edema much improved lately.  Had an episode of dizziness a week or two ago and the next day ahd a low flow alarm. Denies further dizziness. Not taking lasix. No bleeding or infection. No palpitations. Weight at home is relatively stable. Taking all medications.No bleeding, dark stools, neuro symptoms or dark urine.     LVAD interrogation reveals:  Speed: 9000 Flow: --- to 4.3 Power: 4.7w PI: 3.9 - 7.1 during encounter  today  Alarms: one low flow on 08/14/16 @ 5:55 Events: 15 - 40 events a day now   Fixed speed:  9000 Low speed limit:  8400  Primary Controller: Replace back up battery in 24 months   Back up controller: Replace back up battery in 24 months   LVAD interrogated and functioning as expected. Interrogation Negative for elevated flows or powers; drop in PI to 3.9 - 7.1 and flow to 3.0 periodically; pt does report few coughing/gagging episodes but none that they feel like explains the increased PI events. Pt denies orthostatic symptoms. 20 - 40 PI events daily with no VAD alarms noted.     Past Medical History:  Diagnosis Date  . Arthritis   . Automatic implantable cardioverter-defibrillator in situ    a. s/p Medtronic Evera device serial [id number]    . Bradycardia   . CAD (coronary artery disease)    a. s/p MI 1982;  s/p DES to CFX 2011;  s/p NSTEMI 4/12 in setting of VTach;  cath 7/12:  LM ok, LAD mid 30-40%, Dx's with 40%, mCFX stent patent, prox to mid RCA occluded with R to R and L to R collats.  Medical Tx was continued  . Chronic systolic heart failure ([place])    a. s/p LVAD 10/2013 for destination therapy  . CKD (chronic kidney disease)   . CVD (cerebrovascular disease)    a. s/p L CEA 2008  . Cytopenia   . Dysrhythmia    AFIB  . History of GI bleed  a. on ASA- started on plavix during 10/2015 admission for CVA  . HLD (hyperlipidemia)   . HTN (hypertension)   . Hypothyroidism   . Inappropriate therapy from implantable cardioverter-defibrillator    a. ATP for sinus tach SVT wavelet identified SVT but was no  passive (2014)  . Ischemic cardiomyopathy   . 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 L Ear   . Myocardial infarction    1992  . PAF (paroxysmal atrial fibrillation) ([place])   . PVD (peripheral vascular disease) ([place])    a. 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 Shortness of  breath dyspnea    W/ EXERTION   . Sleep apnea    NO CPAP NOW  HE SENT IT BACK YRS AGO  . Stroke [facility])    a. 10/2015 admission   . Ventricular tachycardia ([place])    a. s/p AICD;   h/o ICD shock;  Amiodarone Rx. S/P ablation in 2012    Current Outpatient Prescriptions  Medication Sig Dispense Refill  . amiodarone (PACERONE) 200 MG tablet Take 200 mg by mouth daily.    [name] atorvastatin (LIPITOR) 80 MG tablet Take 80 mg by mouth daily at 6 PM.     . benzonatate (TESSALON) 100 MG capsule Take 1 capsule (100 mg total) by mouth 3 (three) times daily as needed for cough. 21 capsule 1  . clopidogrel (PLAVIX) 75 MG tablet Take 1 tablet (75 mg total) by mouth daily. 90 tablet 3  . docusate sodium (COLACE) 100 MG capsule Take 100 mg by mouth daily as needed for mild constipation.     . furosemide (LASIX) 20 MG tablet Take 1 tablet (20 mg total) by mouth as needed for edema (weight gain). 30 tablet 3  . levothyroxine (SYNTHROID, LEVOTHROID) 200 MCG tablet Take one 200 mcg daily before breakfast every day with 1 1/2 (300 mcg) every Monday. (Patient taking differently: Take 200-300 mcg by mouth daily before breakfast. Take 1 tablet (21mcg) all days except on Mondays take 1&1/2 tablets (326mcg)) 45 tablet 6  . metoprolol succinate (TOPROL-XL) 25 MG 24 hr tablet Take 1 tablet (25 mg total) by mouth 2 (two) times daily. 180 tablet 3  . Multiple Vitamins-Minerals (ICAPS PO) Take 1 tablet by mouth 2 (two) times daily.    . nitroGLYCERIN (NITROSTAT) 0.4 MG SL tablet Place 0.4 mg under the tongue every 5 (five) minutes as needed for chest pain.    . pantoprazole (PROTONIX) 40 MG tablet Take 1 tablet (40 mg total) by mouth 2 (two) times daily. 60 tablet 6  . ranolazine (RANEXA) 500 MG 12 hr tablet TAKE 1 TABLET (500 MG TOTAL) BY MOUTH 2 (TWO) TIMES DAILY. 60 tablet 6  . warfarin (COUMADIN) 2 MG tablet TAKE AS DIRECTED 50 tablet 3  . zinc gluconate 50 MG tablet Take 50 mg by mouth 2 (two) times daily.      No  current facility-administered medications for this encounter.     Demerol [meperidine] and Opium  REVIEW OF SYSTEMS: All systems negative except as listed in HPI, PMH and Problem list.  Vital signs: HR: 60 Doppler modified systolic: 123XX123 Auto cuff: [id number] (80) O2 Sat: 94 % Wt: 206 lbs  Last wt: 205.6 lbs Home Weights - 190 - 193 lbs.  Ht: 6'0"  Physical Exam: GENERAL: NAD. Ambulated in the clinic  without difficulty  HEENT: normal NECK: Supple, JVP 6  no carotid bruits. R CEA scar well-healed No lymphadenopathy or thyromegaly appreciated.   CARDIAC:  Mechanical heart sounds with LVAD hum present.  LUNGS:  Clear ABDOMEN:  Soft, round, nontender, positive bowel sounds x4.   LVAD exit site: well-healed and incorporated.  Dressing dry and intact.  No erythema or drainage.  Stabilization device present and accurately applied.  Driveline dressing is being changed weekly per sterile technique. EXTREMITIES:  Warm and dry, no cyanosis, clubbing, R and LLE trace edema  NEUROLOGIC:  Alert and oriented x 4.  Gait steady.  No aphasia.  No dysarthria.  Affect pleasant. Grossly normal bilateral upper and lower extremity strength.  Wound: lumbar wound healed  ASSESSMENT AND PLAN:  1. Chronic systolic HF: s/p LVAD 123XX123.  Remains NYHA II.   - NYHA II-III. Not overly active. May benefit from cardiac rehab. Volume status ok.     2.  Atrial arrhythmias: Atrial fibrillation, atrial tachycardia.  S/p DC-CV 06/04/14, DC-CV 03/02/15 and 01/19/16.   - Regular rhythm. Continue amio to 200 mg daily.   3. HTN: MAP well-controlled. 4. LVAD: LVAD parameters reviewed personally. Multiple PIs and flow running low. We interrogated ICD in Clinic today and Optivol looks good. No recurrent VT or AF. Will schedule ramp echo.  5. CKD, stage III:  Recent creatinine ok. Recheck today.  6. Anticoagulation management: Goal INR 2-2.5. INR 3.2 today. Will adjust warfarin He is off ASA with GI bleeding. Continue Plavix. Uses  home monitor. 7. Nephrolithiasis s/p Renal Stone lithotripsy: Per Dr Risa Grill. Stable. 8. 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 Continue coumadin and plavix.(with h/o CVA). Recent CBC ok.  No bleeding problems currently.     9. Lumbar compression fracture: s/p L3-L5 fusion 02/03/16. Post-op wound has healed.  10. 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  Continue atorvastatin. (also warfarin INR goal 2-2.5). S/p R CEA 01/13/16. Continue  plavix.  11. H/o amio-induced hypethyroidism: Followed by Dr. Forde Dandy. Amio restarted peri-operatively at low dose due to recurrent atrial arrhythmias. Recent TSH ok on 05/02/16  12. VT - underwent DC-CV 9/17. back on amio. Now quiescent.    Aarron Wierzbicki,MD 10:59 PM

## 2016-08-21 ENCOUNTER — Encounter (HOSPITAL_COMMUNITY): Payer: Medicare Other

## 2016-08-24 ENCOUNTER — Ambulatory Visit (HOSPITAL_COMMUNITY): Payer: Self-pay | Admitting: Pharmacist

## 2016-08-24 LAB — POCT INR: INR: 2.8

## 2016-08-27 ENCOUNTER — Other Ambulatory Visit (HOSPITAL_COMMUNITY): Payer: Self-pay | Admitting: Infectious Diseases

## 2016-08-27 DIAGNOSIS — I480 Paroxysmal atrial fibrillation: Secondary | ICD-10-CM

## 2016-08-27 DIAGNOSIS — I6389 Other cerebral infarction: Secondary | ICD-10-CM

## 2016-08-27 MED ORDER — CLOPIDOGREL BISULFATE 75 MG PO TABS: 75.0000 mg | ORAL_TABLET | Freq: Every day | ORAL | 3 refills | Status: DC

## 2016-08-31 ENCOUNTER — Ambulatory Visit (HOSPITAL_COMMUNITY): Payer: Self-pay | Admitting: Pharmacist

## 2016-08-31 LAB — POCT INR: INR: 2.2

## 2016-09-03 ENCOUNTER — Ambulatory Visit (HOSPITAL_COMMUNITY)
Admission: RE | Admit: 2016-09-03 | Discharge: 2016-09-03 | Disposition: A | Discharge status: Home / Self Care | Payer: Medicare Other | Source: Ambulatory Visit | Attending: Internal Medicine | Admitting: Internal Medicine

## 2016-09-03 ENCOUNTER — Encounter (HOSPITAL_COMMUNITY): Payer: Self-pay | Admitting: Infectious Diseases

## 2016-09-03 DIAGNOSIS — Z87891 Personal history of nicotine dependence: Secondary | ICD-10-CM | POA: Diagnosis not present

## 2016-09-03 DIAGNOSIS — I509 Heart failure, unspecified: Secondary | ICD-10-CM

## 2016-09-03 DIAGNOSIS — Z95811 Presence of heart assist device: Secondary | ICD-10-CM | POA: Insufficient documentation

## 2016-09-03 DIAGNOSIS — I252 Old myocardial infarction: Secondary | ICD-10-CM | POA: Diagnosis not present

## 2016-09-03 DIAGNOSIS — I50810 Right heart failure, unspecified: Secondary | ICD-10-CM

## 2016-09-03 DIAGNOSIS — I110 Hypertensive heart disease with heart failure: Secondary | ICD-10-CM | POA: Insufficient documentation

## 2016-09-03 DIAGNOSIS — I5022 Chronic systolic (congestive) heart failure: Secondary | ICD-10-CM | POA: Diagnosis not present

## 2016-09-03 DIAGNOSIS — I2510 Atherosclerotic heart disease of native coronary artery without angina pectoris: Secondary | ICD-10-CM | POA: Insufficient documentation

## 2016-09-03 NOTE — Progress Notes (Unsigned)
Speed  Flow  PI  Power  LVIDD  AI  Aortic openings  MR  TR  Septum  RV   8600 3.5 6.7 4.1w 5.5 cm  Min 5/5   Sl R bulge   8800 3.3 7.5 4.2w 5.5 cm no Slight 5/5 no no Midline sl left    9200  3.3 6.5 4.5w 5.7cm no Slight 5/5 no no Pull to left  Mod to severe HK   Doppler MAP: ***  Speed: 9000 Flow: 3.3 Power: 4.7w PI: 6.5   Alarms: 2 low flow on 08/14/16 @ 5:55 Events: 20 -45 events a day now   *Only data from 08/27/16 Fixed speed:  9000 Low speed limit:  8400  Primary Controller: Replace back up battery in 24 months   Back up controller: Replace back up battery in 24 months   Ramp ECHO performed at bedside.  At completion of ramp study, patients primary and back up controller programmed:  Fixed speed: 8800 Low speed limit: 8200

## 2016-09-03 NOTE — Progress Notes (Signed)
  Echocardiogram 2D Echocardiogram limited RAMP Study has been performed.  Tresa Res 09/03/2016, 12:10 PM

## 2016-09-05 ENCOUNTER — Other Ambulatory Visit: Payer: Self-pay | Admitting: Internal Medicine

## 2016-09-05 DIAGNOSIS — E039 Hypothyroidism, unspecified: Secondary | ICD-10-CM

## 2016-09-06 ENCOUNTER — Telehealth (HOSPITAL_COMMUNITY): Payer: Self-pay | Admitting: Infectious Diseases

## 2016-09-06 NOTE — Telephone Encounter (Signed)
Paged from [name] re: low flow alarms on [name]'s VAD. Reports that currently the flow is 2.6 and PI 4.4 (which is low for him, but has been known to fluctuate variably based on position). Had her review last 6 alarms to ensure no other alarms and only verified to be low flow. He is asymptomatic. She informs me that he did start exercising today on recumbent bike and did not drink extra fluids.   Discussed with Dr. Haroldine Laws. Advised patient to drink extra fluids now. Echocardogram was performed recently showing declining RV function and speed was decreased to 8800. Advised to call back should alarms persist or if patient becomes symptomatic.   Janene Madeira, RN VAD Coordinator   Office: [phone] 24/7 Emergency VAD Pager: [phone]

## 2016-09-07 ENCOUNTER — Ambulatory Visit (HOSPITAL_COMMUNITY): Payer: Self-pay

## 2016-09-07 LAB — POCT INR: INR: 1.7

## 2016-09-07 NOTE — Progress Notes (Signed)
Spoke with patient regarding out of range INR result (1.7, goal 2-2.5) Patient denies any change in GI status, diet/medication regimen. ADvised per CHF clinical pharmacist Ileene Patrick to take extra 2 mg of coumadin today, then countinue daily regimen as normal (MWF 2mg , all other days take 4mg ). Will recheck INR next Friday (11/10) Patient aware and agreeable to plan as stated above.  Renee Pain, RN

## 2016-09-07 NOTE — Patient Instructions (Signed)
Spoke with patient regarding out of range INR result (1.7, goal 2-2.5) Patient denies any change in GI status, diet/medication regimen. ADvised per CHF clinical pharmacist Ileene Patrick to take extra 2 mg of coumadin today, then countinue daily regimen as normal (MWF 2mg , all other days take 4mg ). Will recheck INR next Friday (11/10) Patient aware and agreeable to plan as stated above.

## 2016-09-14 ENCOUNTER — Ambulatory Visit (HOSPITAL_COMMUNITY): Payer: Self-pay | Admitting: Infectious Diseases

## 2016-09-14 LAB — POCT INR: INR: 2

## 2016-09-21 ENCOUNTER — Ambulatory Visit (HOSPITAL_COMMUNITY): Payer: Self-pay | Admitting: Pharmacist

## 2016-09-21 LAB — POCT INR: INR: 2

## 2016-09-24 ENCOUNTER — Ambulatory Visit (HOSPITAL_COMMUNITY)
Admission: RE | Admit: 2016-09-24 | Discharge: 2016-09-24 | Disposition: A | Discharge status: Home / Self Care | Payer: Medicare Other | Source: Ambulatory Visit | Attending: Cardiology | Admitting: Cardiology

## 2016-09-24 ENCOUNTER — Ambulatory Visit (HOSPITAL_COMMUNITY): Payer: Medicare Other

## 2016-09-24 VITALS — BP 119/84 | HR 79 | Wt 209.0 lb

## 2016-09-24 DIAGNOSIS — Z8673 Personal history of transient ischemic attack (TIA), and cerebral infarction without residual deficits: Secondary | ICD-10-CM | POA: Insufficient documentation

## 2016-09-24 DIAGNOSIS — Z95811 Presence of heart assist device: Secondary | ICD-10-CM | POA: Insufficient documentation

## 2016-09-24 DIAGNOSIS — Z981 Arthrodesis status: Secondary | ICD-10-CM | POA: Diagnosis not present

## 2016-09-24 DIAGNOSIS — I252 Old myocardial infarction: Secondary | ICD-10-CM | POA: Diagnosis not present

## 2016-09-24 DIAGNOSIS — N183 Chronic kidney disease, stage 3 (moderate): Secondary | ICD-10-CM | POA: Diagnosis not present

## 2016-09-24 DIAGNOSIS — I739 Peripheral vascular disease, unspecified: Secondary | ICD-10-CM | POA: Insufficient documentation

## 2016-09-24 DIAGNOSIS — I480 Paroxysmal atrial fibrillation: Secondary | ICD-10-CM

## 2016-09-24 DIAGNOSIS — I50810 Right heart failure, unspecified: Secondary | ICD-10-CM

## 2016-09-24 DIAGNOSIS — Z87442 Personal history of urinary calculi: Secondary | ICD-10-CM | POA: Insufficient documentation

## 2016-09-24 DIAGNOSIS — Z7901 Long term (current) use of anticoagulants: Secondary | ICD-10-CM | POA: Diagnosis not present

## 2016-09-24 DIAGNOSIS — I5022 Chronic systolic (congestive) heart failure: Secondary | ICD-10-CM | POA: Insufficient documentation

## 2016-09-24 DIAGNOSIS — Z7902 Long term (current) use of antithrombotics/antiplatelets: Secondary | ICD-10-CM | POA: Diagnosis not present

## 2016-09-24 DIAGNOSIS — E039 Hypothyroidism, unspecified: Secondary | ICD-10-CM | POA: Diagnosis not present

## 2016-09-24 DIAGNOSIS — I471 Supraventricular tachycardia: Secondary | ICD-10-CM | POA: Insufficient documentation

## 2016-09-24 DIAGNOSIS — I509 Heart failure, unspecified: Secondary | ICD-10-CM

## 2016-09-24 DIAGNOSIS — I130 Hypertensive heart and chronic kidney disease with heart failure and stage 1 through stage 4 chronic kidney disease, or unspecified chronic kidney disease: Secondary | ICD-10-CM | POA: Diagnosis not present

## 2016-09-24 DIAGNOSIS — G4730 Sleep apnea, unspecified: Secondary | ICD-10-CM | POA: Diagnosis not present

## 2016-09-24 DIAGNOSIS — I255 Ischemic cardiomyopathy: Secondary | ICD-10-CM | POA: Diagnosis not present

## 2016-09-24 DIAGNOSIS — Z85820 Personal history of malignant melanoma of skin: Secondary | ICD-10-CM | POA: Diagnosis not present

## 2016-09-24 DIAGNOSIS — I2510 Atherosclerotic heart disease of native coronary artery without angina pectoris: Secondary | ICD-10-CM | POA: Diagnosis not present

## 2016-09-24 DIAGNOSIS — I4891 Unspecified atrial fibrillation: Secondary | ICD-10-CM | POA: Diagnosis not present

## 2016-09-24 DIAGNOSIS — E785 Hyperlipidemia, unspecified: Secondary | ICD-10-CM | POA: Insufficient documentation

## 2016-09-24 LAB — CBC
HCT: 38.5 % — ABNORMAL LOW (ref 39.0–52.0)
Hemoglobin: 12.6 g/dL — ABNORMAL LOW (ref 13.0–17.0)
MCH: 31.4 pg (ref 26.0–34.0)
MCHC: 32.7 g/dL (ref 30.0–36.0)
MCV: 96 fL (ref 78.0–100.0)
PLATELETS: 172 10*3/uL (ref 150–400)
RBC: 4.01 MIL/uL — ABNORMAL LOW (ref 4.22–5.81)
RDW: 15.5 % (ref 11.5–15.5)
WBC: 7.5 10*3/uL (ref 4.0–10.5)

## 2016-09-24 LAB — BASIC METABOLIC PANEL
Anion gap: 9 (ref 5–15)
BUN: 34 mg/dL — AB (ref 6–20)
CALCIUM: 9.5 mg/dL (ref 8.9–10.3)
CHLORIDE: 105 mmol/L (ref 101–111)
CO2: 23 mmol/L (ref 22–32)
CREATININE: 1.65 mg/dL — AB (ref 0.61–1.24)
GFR calc Af Amer: 45 mL/min — ABNORMAL LOW (ref >60)
GFR calc non Af Amer: 39 mL/min — ABNORMAL LOW (ref >60)
Glucose, Bld: 108 mg/dL — ABNORMAL HIGH (ref 65–99)
Potassium: 4.8 mmol/L (ref 3.5–5.1)
SODIUM: 137 mmol/L (ref 135–145)

## 2016-09-24 LAB — LACTATE DEHYDROGENASE: LDH: 257 U/L — ABNORMAL HIGH (ref 98–192)

## 2016-09-24 NOTE — Progress Notes (Signed)
Patient presents for follow up in [facility] today. Reports no problems with VAD equipment or concerns with drive line. Reports feeling well and is exercising at the [facility] on recumbent bike. Had a low flow alarm since last visit and dropping speed to 8800. No complaints.   Vital Signs:  Doppler Pressure: 80 Automatc BP: 119/84 (102)  HR:  90 SPO2: 97 %  Weight: 209 lb w/o eqt Last weight: 206 lb  VAD Indication: Destination Therapy - age excluding   VAD interrogation & Equipment Management: Speed: 8800 Flow: 2.7 with frequent sustained --- Power: 3.9 w    PI: 4.4  Alarms: 3 LOW FLOW alarms - 2 in clinic today during visit and one on 11/8.  Events: 8 - 15 daily with outliers of 30 - 40   Fixed speed: 8800 Low speed limit: 8200  Primary Controller:  Replace back up battery in 22 months. Back up controller:   Replace back up battery in 22 months.  I reviewed the LVAD parameters from today and compared the results to the patient's prior recorded data. LVAD interrogation was NEGATIVE for significant power changes, NEGATIVE for clinical alarms and STABLE for PI events/speed drops. No programming changes were made and pump is functioning within specified parameters. Pt is performing daily controller and system monitor self tests along with completing weekly and monthly maintenance for LVAD equipment.  LVAD equipment check completed and is in good working order. Back-up equipment present. Charged back up battery and performed self-test on equipment.   Annual Equipment Maintenance on UBC/PM was performed today in clinic visit. Patient was given a SLA back up battery for power module as well as a new 20' patient cable per manufacturer recommendations. Also provided with 8 new 14V Li Ion batteries as they were due to expire 10/2016. All batteries were charged prior to being given to patient.  New batteries manufactured 02/10/2016 with SN [id number] and man. 02/22/16 with SN  [id number]. Advised to bring old batteries to be properly disposed of.   Exit Site Care: Drive line is being maintained weekly by [name]. Drive line exit site well healed and incorporated. The velour is fully implanted at exit site. Dressing dry and intact. No erythema or drainage. Stabilization device present and accurately applied. Pt denies fever or chills. Provided with 8 weekly dressing kits.   Significant Events on VAD Support:  02/2015 >> GIB (removed ASA, 2-2.5 INR) 10/2015 >> CVA (Plavix started)  Device: Medtronic  Therapies: On Last check: unknown  BP & Labs:  MAP 85 - Doppler is reflecting modified systolic.   Hgb - No S/S of bleeding. Specifically denies melena/BRBPR or nosebleeds.  LDH stable at 257 with established baseline of 250 - 280. Denies tea-colored urine. No power elevations noted on interrogation.   Reviewed and demonstrated the following to patient and caregiver:               Reviewed the steps for replacing the running system controller with the back-up system controller (see patient handbook section 2 or the appropriate pamphlet).             X  Demonstrated (using the mock-driveline and controller) how to connect and disconnect the mock-driveline in back up system controller in a timely manner (less than 10 seconds) with return demonstration by patient and caregiver.              X   Reviewed system controller alarms and troubleshooting including hazard and  advisory alarms and accessing alarm history. Re-enforced NOT TO attempt to perform any task displayed on the display screen alone or without calling the VAD pager [phone] (see patient handbook section 5 or the appropriate pamphlet).                       X  Reviewed how to handle an emergency including when the pump is running and when the pump has stopped (see patient handbook section 8). Call 911 first and then the VAD pager at [phone].             X   Reviewed 14-volt  lithium ion battery calibration steps (see patient handbook section 3).            X   Reviewed contents of black bag and what must be with patient at all times.            X  Reviewed driveline exit site including cleansing, dressing, and immobilizing with an anchor device to prevent exit site trauma.             X  Reviewed weekly maintenance which includes: Reviewing "Replacing the [place] with a [employer]" pamphlet. Clean batteries, clips, and battery charger contacts.  Check cables for damages. Rotate batteries; keep all 8 charged.              X  Reviewed monthly maintenance which includes: Reviewing Alarms and Troubleshooting. Check battery manufacturer dates. Check use/charge cycles for each battery; remember to re-calibrate every 70 uses when prompted.              X  Additional pamphlets Guide to Replacing the [place] with the [place] for Patients and Their Caregivers and HM II Alarms for Patients and Their Caregivers given to patient.              X   Batteries Manufacture Date: Number of uses: Re-calibration  10/2013  **Replaced today   Replaced today  Performed by patient    Backup system controller 11 volt battery charged during visit and self-test performed with pass.    3 year Intermacs follow up completed including:  Quality of Life, KCCQ-12, post-VAD Surveys independently.   Neurocognitive trail making completed correctly in 48m 28s.   Pt was unable to finish 6 minute walk test and only ambulated approx 450' in 69m 48s. Limited by back pain.  Janene Madeira, RN VAD Coordinator   Office: [phone] 24/7 Emergency VAD Pager: [phone]

## 2016-09-24 NOTE — Patient Instructions (Signed)
No medication changes today.   Back in 2 months today

## 2016-09-25 NOTE — Progress Notes (Signed)
Patient ID: [name], male   DOB: [birth year], 75 y.o.   MRN: [id number]   VAD CLINIC PROGRESS NOTE  PCP: Dr. Kandice Robinsons ([place]) GU: Dr Risa Grill  Neuro Surgeon: Dr Vertell Limber Vascular: Dr Kellie Simmering Primary HF: Dr Haroldine Laws  [name] is a 75 yo male with h/o VT, PAD and severe systolic HF (EF 0000000) due to mixed ischemic/nonischemic CM he is s/p HM II LVAD implant for destination therapy on October 19, 2013. On 09/01/14 underwent cystoscopy and flexible ureteroscopy with lithotripsy and basket extraction.   VAD implantation was complicated by VT, syncope, A fib/Aflutter and persistent low output felt to be due to RHF. Required short term milrinone.   GU Event 09/2015 Hematuria and chronic nephrolithiasis. Renal US showed mild hyronephrosis and bilateral renal cysts. Dr. Risa Grill took back for repeat lithotripsy and stone extraction by ureteroscopy and J stent placement.  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  01/2016 R CEA. He was started on Plavix.  01/2016 Lumbar fusion L3-L5 with pedicle screws posterolateral arthrodesis with BMP, allograft  Admitted 9/17 with high fevers and productive cough. CXR negative. Treated with levaquin x 7 days for acute bronchitis. Also found to have VT and underwent DC-CV. Seen by EP and amio increased  Follow up LVAD Clinic: Presents for LVAD follow up. Recent ramp echo and speed turned down to 8800 due to RV failure. Now doing very well. Now going to the gym and rides about 30 mins on recumbent bike. Denies CP or undue SOB. Edema much improved lately. Not taking lasix. No bleeding or infection. No palpitations. Weight at home is relatively stable. Taking all medications.No bleeding, dark stools, neuro symptoms or dark urine. Has noticed several low flow alarms on VAD.    VAD interrogation & Equipment Management: Speed: 8800 Flow: 2.7 with frequent  sustained --- Power: 3.9 w PI: 4.4  Alarms: 3 LOW FLOW alarms - 2 in clinic today during visit and one on 11/8.  Events: 8 - 15 daily with outliers of 30 - 40   Fixed speed: 8800 Low speed limit: 8200  Primary Controller: Replace back up battery in 73months. Back up controller: Replace back up battery in 8months. LVAD interrogated and functioning as expected. Interrogation Negative for elevated flows or powers; drop in PI to 3.9 - 7.1 and flow to 3.0 periodically; pt does report few coughing/gagging episodes but none that they feel like explains the increased PI events. Pt denies orthostatic symptoms. 20 - 40 PI events daily with no VAD alarms noted.     Past Medical History:  Diagnosis Date  . Arthritis   . Automatic implantable cardioverter-defibrillator in situ    a. s/p Medtronic Evera device serial [id number]    . Bradycardia   . CAD (coronary artery disease)    a. s/p MI 1982;  s/p DES to CFX 2011;  s/p NSTEMI 4/12 in setting of VTach;  cath 7/12:  LM ok, LAD mid 30-40%, Dx's with 40%, mCFX stent patent, prox to mid RCA occluded with R to R and L to R collats.  Medical Tx was continued  . Chronic systolic heart failure ([place])    a. s/p LVAD 10/2013 for destination therapy  . CKD (chronic kidney disease)   . CVD (cerebrovascular disease)    a. s/p L CEA 2008  . Cytopenia   . Dysrhythmia  AFIB  . History of GI bleed    a. on ASA- started on plavix during 10/2015 admission for CVA  . HLD (hyperlipidemia)   . HTN (hypertension)   . Hypothyroidism   . Inappropriate therapy from implantable cardioverter-defibrillator    a. ATP for sinus tach SVT wavelet identified SVT but was no  passive (2014)  . Ischemic cardiomyopathy   . Melanoma of ear ([place])    a. L Ear   . Myocardial infarction    1992  . PAF (paroxysmal atrial fibrillation) ([place])   . PVD (peripheral vascular disease) ([place])    a. h/o claudication;  s/p R fem to pop BPG 3/11;  s/p L CFA BPG 7/03;  s/p  repair of inf AAA 6/03;  s/p aorta to bilat renal BPG 6/03  . Shortness of breath dyspnea    W/ EXERTION   . Sleep apnea    NO CPAP NOW  HE SENT IT BACK YRS AGO  . Stroke [facility])    a. 10/2015 admission   . Ventricular tachycardia ([place])    a. s/p AICD;   h/o ICD shock;  Amiodarone Rx. S/P ablation in 2012    Current Outpatient Prescriptions  Medication Sig Dispense Refill  . amiodarone (PACERONE) 200 MG tablet Take 200 mg by mouth daily.    [name] atorvastatin (LIPITOR) 80 MG tablet Take 80 mg by mouth daily at 6 PM.     . benzonatate (TESSALON) 100 MG capsule Take 1 capsule (100 mg total) by mouth 3 (three) times daily as needed for cough. 21 capsule 1  . clopidogrel (PLAVIX) 75 MG tablet Take 1 tablet (75 mg total) by mouth daily. 90 tablet 3  . docusate sodium (COLACE) 100 MG capsule Take 100 mg by mouth daily as needed for mild constipation.     . furosemide (LASIX) 20 MG tablet Take 1 tablet (20 mg total) by mouth as needed for edema (weight gain). 30 tablet 3  . levothyroxine (SYNTHROID, LEVOTHROID) 200 MCG tablet TAKE 1 TABLET DAILY BEFORE BREAKFAST WITH 1 & 1/2 TABLETS EVERY MONDAY. 45 tablet 3  . metoprolol succinate (TOPROL-XL) 25 MG 24 hr tablet Take 1 tablet (25 mg total) by mouth 2 (two) times daily. 180 tablet 3  . Multiple Vitamins-Minerals (ICAPS PO) Take 1 tablet by mouth 2 (two) times daily.    . nitroGLYCERIN (NITROSTAT) 0.4 MG SL tablet Place 0.4 mg under the tongue every 5 (five) minutes as needed for chest pain.    . pantoprazole (PROTONIX) 40 MG tablet Take 1 tablet (40 mg total) by mouth 2 (two) times daily. 60 tablet 6  . ranolazine (RANEXA) 500 MG 12 hr tablet TAKE 1 TABLET (500 MG TOTAL) BY MOUTH 2 (TWO) TIMES DAILY. 60 tablet 6  . warfarin (COUMADIN) 2 MG tablet TAKE AS DIRECTED 50 tablet 3  . zinc gluconate 50 MG tablet Take 50 mg by mouth 2 (two) times daily.      No current facility-administered medications for this encounter.     Demerol [meperidine] and  Opium  REVIEW OF SYSTEMS: All systems negative except as listed in HPI, PMH and Problem list.  Vital Signs:  Doppler Pressure:80 Automatc BP: 119/84 (102)  HR: 90 SPO2: 97 %  Weight: 209 lb w/o eqt Last weight: 206 lb   Physical Exam: GENERAL: NAD. Ambulated in the clinic without difficulty  HEENT: normal NECK: Supple, JVP 6  no carotid bruits. R CEA scar well-healed No lymphadenopathy or thyromegaly appreciated.   CARDIAC:  Mechanical heart sounds with LVAD hum present.  LUNGS:  Clear ABDOMEN:  Soft, round, nontender, positive bowel sounds x4.   LVAD exit site: well-healed and incorporated.  Dressing dry and intact.  No erythema or drainage.  Stabilization device present and accurately applied.  Driveline dressing is being changed weekly per sterile technique. EXTREMITIES:  Warm and dry, no cyanosis, clubbing, no edema  NEUROLOGIC:  Alert and oriented x 4.  Gait steady.  No aphasia.  No dysarthria.  Affect pleasant. Grossly normal bilateral upper and lower extremity strength.   ASSESSMENT AND PLAN:  1. Chronic systolic HF: s/p HM-II LVAD 10/2014.  Remains NYHA II.   - Doing well.  NYHA II.  - LVAD speed recently reduced to 8800 due to RV failure.  - Volume status ok. Continue prn lasix.  2.  Atrial arrhythmias: Atrial fibrillation, atrial tachycardia.  S/p DC-CV 06/04/14, DC-CV 03/02/15 and 01/19/16.   -NSR today on ECG. Continue amio to 200 mg daily.  Watch for recurrent thyroid issues. 3. HTN: MAP well-controlled. 4. LVAD: LVAD parameters reviewed personally. Multiple PIs and flow running low.Slightly improved with decreasing speed to 8800. Asymptomatic  No recurrent VT or AF on recent ICD check. 5. CKD, stage III:  Stable. Cr 1.65 today  6. Anticoagulation management: Goal INR 2-2.5  He is off ASA with GI bleeding. Continue Plavix. Uses home INR monitor. 7. Nephrolithiasis s/p Renal Stone lithotripsy: Per Dr Risa Grill. Stable. 8. H/o GIB: s/p clipping of 2 jejunal AVMs in  4/16. Continue coumadin and plavix.(with h/o CVA). HGb stable.  No bleeding problems currently.     9. Lumbar compression fracture: s/p L3-L5 fusion 02/03/16. Post-op wound has healed.  10. 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  Continue atorvastatin. (also warfarin INR goal 2-2.5). S/p R CEA 01/13/16. Continue  plavix.  11. H/o amio-induced hypethyroidism: Followed by Dr. Forde Dandy. Amio restarted peri-operatively at low dose due to recurrent atrial arrhythmias. Recent TSH ok 12. VT - underwent DC-CV 9/17. back on amio. Now quiescent.    Bensimhon, Daniel,MD 9:31 PM

## 2016-10-01 ENCOUNTER — Encounter (HOSPITAL_COMMUNITY): Payer: Medicare Other

## 2016-10-02 ENCOUNTER — Ambulatory Visit (INDEPENDENT_AMBULATORY_CARE_PROVIDER_SITE_OTHER): Payer: Medicare Other | Admitting: *Deleted

## 2016-10-02 DIAGNOSIS — I472 Ventricular tachycardia, unspecified: Secondary | ICD-10-CM

## 2016-10-03 ENCOUNTER — Ambulatory Visit (HOSPITAL_COMMUNITY): Payer: Self-pay | Admitting: Pharmacist

## 2016-10-03 LAB — POCT INR: INR: 2.5

## 2016-10-03 NOTE — Progress Notes (Signed)
Remote ICD transmission.   

## 2016-10-04 ENCOUNTER — Encounter: Payer: Self-pay | Admitting: Cardiology

## 2016-10-11 ENCOUNTER — Ambulatory Visit (HOSPITAL_COMMUNITY): Payer: Self-pay | Admitting: Pharmacist

## 2016-10-11 LAB — POCT INR: INR: 2.3

## 2016-10-17 ENCOUNTER — Ambulatory Visit (HOSPITAL_COMMUNITY): Payer: Self-pay | Admitting: Pharmacist

## 2016-10-17 LAB — POCT INR: INR: 2.6

## 2016-10-24 ENCOUNTER — Ambulatory Visit (HOSPITAL_COMMUNITY): Payer: Self-pay | Admitting: Pharmacist

## 2016-10-24 LAB — POCT INR: INR: 2.5

## 2016-10-26 ENCOUNTER — Other Ambulatory Visit (HOSPITAL_COMMUNITY): Payer: Self-pay | Admitting: *Deleted

## 2016-10-26 MED ORDER — RANOLAZINE ER 500 MG PO TB12: ORAL_TABLET | ORAL | 6 refills | Status: DC

## 2016-10-31 ENCOUNTER — Ambulatory Visit (HOSPITAL_COMMUNITY): Payer: Self-pay | Admitting: Infectious Diseases

## 2016-10-31 LAB — CUP PACEART REMOTE DEVICE CHECK
Brady Statistic AP VS Percent: 78.34 %
Brady Statistic AS VP Percent: 0.03 %
Brady Statistic RA Percent Paced: 78.47 %
Date Time Interrogation Session: 20171128123022
HIGH POWER IMPEDANCE MEASURED VALUE: 39 Ohm
HighPow Impedance: 49 Ohm
Implantable Lead Implant Date: 20030714
Implantable Lead Location: 753859
Implantable Lead Model: 5592
Lead Channel Impedance Value: 399 Ohm
Lead Channel Pacing Threshold Amplitude: 0.625 V
Lead Channel Pacing Threshold Amplitude: 1.125 V
Lead Channel Pacing Threshold Pulse Width: 0.4 ms
Lead Channel Sensing Intrinsic Amplitude: 1.75 mV
Lead Channel Setting Pacing Amplitude: 2 V
MDC IDC LEAD IMPLANT DT: 20140801
MDC IDC LEAD LOCATION: 753860
MDC IDC LEAD MODEL: 6944
MDC IDC MSMT BATTERY REMAINING LONGEVITY: 61 mo
MDC IDC MSMT BATTERY VOLTAGE: 2.98 V
MDC IDC MSMT LEADCHNL RA PACING THRESHOLD PULSEWIDTH: 0.4 ms
MDC IDC MSMT LEADCHNL RA SENSING INTR AMPL: 1.75 mV
MDC IDC MSMT LEADCHNL RV IMPEDANCE VALUE: 513 Ohm
MDC IDC MSMT LEADCHNL RV IMPEDANCE VALUE: 608 Ohm
MDC IDC MSMT LEADCHNL RV SENSING INTR AMPL: 1.75 mV
MDC IDC MSMT LEADCHNL RV SENSING INTR AMPL: 1.75 mV
MDC IDC PG IMPLANT DT: 20140721
MDC IDC SET LEADCHNL RA PACING AMPLITUDE: 2.25 V
MDC IDC SET LEADCHNL RV PACING PULSEWIDTH: 0.4 ms
MDC IDC SET LEADCHNL RV SENSING SENSITIVITY: 0.3 mV
MDC IDC STAT BRADY AP VP PERCENT: 0.13 %
MDC IDC STAT BRADY AS VS PERCENT: 21.49 %
MDC IDC STAT BRADY RV PERCENT PACED: 0.16 %

## 2016-10-31 LAB — POCT INR: INR: 2.6

## 2016-11-01 ENCOUNTER — Telehealth (HOSPITAL_COMMUNITY): Payer: Self-pay | Admitting: Infectious Diseases

## 2016-11-01 NOTE — Telephone Encounter (Signed)
Called to report 'tea colored urine' that suddenly started tonight. No other symptoms to report - specifically no fatigue, swelling/weight gain, shortness of breath, orthopnea or chest pain. No VAD alarms and pump parameters are baseline for him 8800 rpm / 4.0 lpm / 8.0 PI / 3.8 w power. INRs reviewed for last 6 weeks and all have been therapeutic. Last LDH was 62m ago and was within normal expected level.   I asked her to send a picture of what the urine looked like - per photo it is more pink/red tinged than dark red/brown. He has a h/o kidney stones but is asymptomatic for that at this time. No flank/back pain, nausea, abdominal pain, frequency, dysuria. D/W Dr. Aundra Dubin and advised to continue to monitor tonight barring he does not have any further symptoms as the urine does not appear to be hemolysis related. I will call to check in with them tomorrow - likely will need to FU with urologist if symptoms persist. Verbalized understanding and comfort with plan.   Janene Madeira, RN VAD Coordinator   Office: [phone] 24/7 Emergency VAD Pager: [phone]

## 2016-11-07 ENCOUNTER — Ambulatory Visit (HOSPITAL_COMMUNITY): Payer: Self-pay | Admitting: Pharmacist

## 2016-11-07 LAB — POCT INR: INR: 2.5

## 2016-11-14 ENCOUNTER — Ambulatory Visit (HOSPITAL_COMMUNITY): Payer: Self-pay | Admitting: Pharmacist

## 2016-11-14 LAB — POCT INR: INR: 2.6

## 2016-11-19 ENCOUNTER — Ambulatory Visit (HOSPITAL_COMMUNITY)
Admission: RE | Admit: 2016-11-19 | Discharge: 2016-11-19 | Disposition: A | Discharge status: Home / Self Care | Payer: Medicare Other | Source: Ambulatory Visit | Attending: Cardiology | Admitting: Cardiology

## 2016-11-19 ENCOUNTER — Ambulatory Visit (HOSPITAL_COMMUNITY): Payer: Self-pay | Admitting: Pharmacist

## 2016-11-19 VITALS — BP 96/74 | HR 62 | Ht 72.0 in | Wt 206.8 lb

## 2016-11-19 DIAGNOSIS — M4856XA Collapsed vertebra, not elsewhere classified, lumbar region, initial encounter for fracture: Secondary | ICD-10-CM | POA: Insufficient documentation

## 2016-11-19 DIAGNOSIS — I472 Ventricular tachycardia, unspecified: Secondary | ICD-10-CM

## 2016-11-19 DIAGNOSIS — I471 Supraventricular tachycardia: Secondary | ICD-10-CM | POA: Insufficient documentation

## 2016-11-19 DIAGNOSIS — Z7901 Long term (current) use of anticoagulants: Secondary | ICD-10-CM | POA: Diagnosis not present

## 2016-11-19 DIAGNOSIS — J209 Acute bronchitis, unspecified: Secondary | ICD-10-CM | POA: Diagnosis not present

## 2016-11-19 DIAGNOSIS — Z79899 Other long term (current) drug therapy: Secondary | ICD-10-CM | POA: Diagnosis not present

## 2016-11-19 DIAGNOSIS — I5022 Chronic systolic (congestive) heart failure: Secondary | ICD-10-CM | POA: Diagnosis not present

## 2016-11-19 DIAGNOSIS — I428 Other cardiomyopathies: Secondary | ICD-10-CM | POA: Insufficient documentation

## 2016-11-19 DIAGNOSIS — N183 Chronic kidney disease, stage 3 (moderate): Secondary | ICD-10-CM | POA: Diagnosis not present

## 2016-11-19 DIAGNOSIS — I252 Old myocardial infarction: Secondary | ICD-10-CM | POA: Diagnosis not present

## 2016-11-19 DIAGNOSIS — I130 Hypertensive heart and chronic kidney disease with heart failure and stage 1 through stage 4 chronic kidney disease, or unspecified chronic kidney disease: Secondary | ICD-10-CM | POA: Diagnosis not present

## 2016-11-19 DIAGNOSIS — Z9889 Other specified postprocedural states: Secondary | ICD-10-CM | POA: Diagnosis not present

## 2016-11-19 DIAGNOSIS — Z8673 Personal history of transient ischemic attack (TIA), and cerebral infarction without residual deficits: Secondary | ICD-10-CM | POA: Insufficient documentation

## 2016-11-19 DIAGNOSIS — I480 Paroxysmal atrial fibrillation: Secondary | ICD-10-CM | POA: Diagnosis not present

## 2016-11-19 DIAGNOSIS — Z95811 Presence of heart assist device: Secondary | ICD-10-CM | POA: Diagnosis not present

## 2016-11-19 LAB — PROTIME-INR
INR: 2.53
PROTHROMBIN TIME: 27.7 s — AB (ref 11.4–15.2)

## 2016-11-19 LAB — CBC
HCT: 37.6 % — ABNORMAL LOW (ref 39.0–52.0)
Hemoglobin: 12.4 g/dL — ABNORMAL LOW (ref 13.0–17.0)
MCH: 31.6 pg (ref 26.0–34.0)
MCHC: 33 g/dL (ref 30.0–36.0)
MCV: 95.9 fL (ref 78.0–100.0)
PLATELETS: 168 10*3/uL (ref 150–400)
RBC: 3.92 MIL/uL — AB (ref 4.22–5.81)
RDW: 15.4 % (ref 11.5–15.5)
WBC: 7.4 10*3/uL (ref 4.0–10.5)

## 2016-11-19 LAB — BASIC METABOLIC PANEL
Anion gap: 9 (ref 5–15)
BUN: 34 mg/dL — AB (ref 6–20)
CHLORIDE: 105 mmol/L (ref 101–111)
CO2: 24 mmol/L (ref 22–32)
CREATININE: 1.75 mg/dL — AB (ref 0.61–1.24)
Calcium: 9.2 mg/dL (ref 8.9–10.3)
GFR calc Af Amer: 42 mL/min — ABNORMAL LOW (ref >60)
GFR calc non Af Amer: 36 mL/min — ABNORMAL LOW (ref >60)
GLUCOSE: 106 mg/dL — AB (ref 65–99)
POTASSIUM: 4.9 mmol/L (ref 3.5–5.1)
SODIUM: 138 mmol/L (ref 135–145)

## 2016-11-19 LAB — LACTATE DEHYDROGENASE: LDH: 332 U/L — ABNORMAL HIGH (ref 98–192)

## 2016-11-19 MED ORDER — DOXYCYCLINE HYCLATE 100 MG PO CAPS: 100.0000 mg | ORAL_CAPSULE | Freq: Two times a day (BID) | ORAL | 0 refills | Status: AC

## 2016-11-19 NOTE — Progress Notes (Signed)
Patient presents for follow up in [facility] today. Reports no problems with VAD equipment or concerns with drive line.   Vital Signs:  Doppler Pressure: 80 Automatc BP: 119/84 (102)  HR:  90 SPO2: 97 %  Weight: 209 lb w/o eqt Last weight: 206 lb  VAD Indication: Destination Therapy - age excluding   VAD interrogation & Equipment Management: Speed: 8800 Flow: 3.0 with frequent sustained --- Power: 4.0 w    PI: 5.4 - 7.0  Alarms: none   Events: 5 - 15 daily with outliers of 30 - 40  Fixed speed: 8800 Low speed limit: 8200  Primary Controller:  Replace back up battery in 20 months. Back up controller:   Replace back up battery in 20 months.  I reviewed the LVAD parameters from today and compared the results to the patient's prior recorded data reviewed with Dr. Haroldine Laws. LVAD interrogation was NEGATIVE for significant power changes, NEGATIVE for clinical alarms and STABLE for PI events/speed drops. No programming changes were made and pump is functioning within specified parameters. Pt is performing daily controller and system monitor self tests along with completing weekly and monthly maintenance for LVAD equipment.  LVAD equipment check completed and is in good working order. Back-up equipment present. Charged back up battery and performed self-test on equipment.   Annual Equipment Maintenance on UBC/PM on December 2017.  Exit Site Care: Drive line is being maintained weekly by [name]. Drive line exit site well healed and incorporated. The velour is fully implanted at exit site. Dressing dry and intact. No erythema or drainage. Stabilization device present and accurately applied. Pt denies fever or chills. Provided with 8 weekly dressing kits.   Significant Events on VAD Support:  02/2015 >> GIB (removed ASA, 2-2.5 INR) 10/2015 >> CVA (Plavix started)  Device: Medtronic  Therapies: On Last check: unknown  BP & Labs:  MAP 80   Hgb 12.4 - No S/S of bleeding.  Specifically denies melena/BRBPR or nosebleeds.  LDH slightly elevated at 332 with established baseline of 250 - 280. Will follow. Denies tea-colored urine. No power elevations noted on interrogation.    Janene Madeira, RN VAD Coordinator   Office: [phone] 24/7 Emergency VAD Pager: [phone]

## 2016-11-19 NOTE — Progress Notes (Signed)
Patient presents for follow up in [facility] today. Reports no problems with VAD equipment or concerns with drive line.  Vital Signs:  Doppler Pressure: 72 Automatc BP: 96/74 (78) HR:  62 SPO2: 97 %  Weight: 2.6.8 lb w/ eqt Last weight: 206 lb Home Weight: 196 - 198 lbs.  Body mass index is 28.05 kg/m.  VAD Indication: Destination Therapy - age excluding   VAD interrogation & Equipment Management: Speed: 8800 Flow: 3.0 with frequent sustained --- Power: 4.0 w    PI: 5.4 - 7.0  Alarms: none   Events: 5 - 15 daily with outliers of 30 - 40  Fixed speed: 8800 Low speed limit: 8200  Primary Controller:  Replace back up battery in 20 months. Back up controller:   Replace back up battery in 20 months.  I reviewed the LVAD parameters from today and compared the results to the patient's prior recorded data reviewed with Dr. Haroldine Laws. LVAD interrogation was NEGATIVE for significant power changes, NEGATIVE for clinical alarms and STABLE for PI events/speed drops. No programming changes were made and pump is functioning within specified parameters. Pt is performing daily controller and system monitor self tests along with completing weekly and monthly maintenance for LVAD equipment.  LVAD equipment check completed and is in good working order. Back-up equipment present. Charged back up battery and performed self-test on equipment.   Annual Equipment Maintenance on UBC/PM on December 2017.  Exit Site Care: Drive line is being maintained weekly by [name]. Drive line exit site well healed and incorporated. The velour is fully implanted at exit site. Dressing dry and intact. No erythema or drainage. Stabilization device present and accurately applied. Pt denies fever or chills. Has adequate dressing supplies at home.   Significant Events on VAD Support:  02/2015 >> GIB (removed ASA, 2-2.5 INR) 10/2015 >> CVA (Plavix started)  Device: Medtronic  Therapies: On Last check:  unknown  BP & Labs:  MAP 72 - 78 - Doppler is reflecting modified systolic.   Hgb 12.4 - No S/S of bleeding. Specifically denies melena/BRBPR or nosebleeds.  LDH stable at 257 with established baseline of 250 - 280. Denies tea-colored urine. No power elevations noted on interrogation.    Janene Madeira, RN VAD Coordinator   Office: [phone] 24/7 Emergency VAD Pager: [phone]

## 2016-11-19 NOTE — Progress Notes (Signed)
Patient ID: MYNOR HILFIKER, male   DOB: 1941-01-27, 76 y.o.   MRN: [id number]   VAD CLINIC PROGRESS NOTE  PCP: Dr. Kandice Robinsons ([place]) GU: Dr Risa Grill  Neuro Surgeon: Dr Vertell Limber Vascular: Dr Kellie Simmering Primary HF: Dr Haroldine Laws  Demitrios Dittoe is a 76 yo male with h/o VT, PAD and severe systolic HF (EF 0000000) due to mixed ischemic/nonischemic CM he is s/p HM II LVAD implant for destination therapy on October 19, 2013.VAD implantation was complicated by VT, syncope, A fib/Aflutter and persistent low output felt to be due to RHF. Required short term milrinone.    GU Event 09/01/14 had impacted ureteral stone and underwent cystoscopy and flexible ureteroscopy with lithotripsy and basket extraction.   09/2015 Hematuria and chronic nephrolithiasis. Renal US showed mild hyronephrosis and bilateral renal cysts. Dr. Risa Grill took back for repeat lithotripsy and stone extraction by ureteroscopy and J stent placement.  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  01/2016 R CEA. He was started on Plavix.  01/2016 Lumbar fusion L3-L5 with pedicle screws posterolateral arthrodesis with BMP, allograft  Admitted 9/17 with high fevers and productive cough. CXR negative. Treated with levaquin x 7 days for acute bronchitis. Also found to have VT and underwent DC-CV. Seen by EP and amio increased  Follow up LVAD Clinic: Presents for LVAD follow up. Had ramp echo in 10/17 speed turned down to 8800 due to RV failure. Overall doing very well. Goes to gym regularly and rides about 30 mins on recumbent bike. Denies CP or undue SOB. Edema well controlled. Takes lasix only as needed. Only real complaint is URI with productive green mucus for almost 2 weeks. No fevers or chills. Denies bleeding.Taking all medications.No bleeding, dark stools, neuro symptoms or dark urine. Low flows on VAD have resolved  VAD  Indication: Destination Therapy- age excluding   VAD interrogation & Equipment Management: Speed: 8800 Flow: 3.0 with frequent sustained --- Power: 4.0 w PI: 5.4 - 7.0  Alarms: none   Events: 5 - 15 daily with outliers of 30 - 40  Fixed speed: 8800 Low speed limit: 8200  Primary Controller: Replace back up battery in 74months. Back up controller: Replace back up battery in 24months. LVAD interrogated and functioning as expected. Interrogation Negative for elevated flows or powers; drop in PI to 3.9 - 7.1 and flow to 3.0 periodically; pt does report few coughing/gagging episodes but none that they feel like explains the increased PI events. Pt denies orthostatic symptoms. 20 - 40 PI events daily with no VAD alarms noted.     Past Medical History:  Diagnosis Date  . Arthritis   . Automatic implantable cardioverter-defibrillator in situ    a. s/p Medtronic Evera device serial [id number]    . Bradycardia   . CAD (coronary artery disease)    a. s/p MI 1982;  s/p DES to CFX 2011;  s/p NSTEMI 4/12 in setting of VTach;  cath 7/12:  LM ok, LAD mid 30-40%, Dx's with 40%, mCFX stent patent, prox to mid RCA occluded with R to R and L to R collats.  Medical Tx was continued  . Chronic systolic heart failure ([place])    a. s/p LVAD 10/2013 for destination therapy  . CKD (chronic kidney disease)   . CVD (cerebrovascular disease)    a. s/p L CEA 2008  . Cytopenia   . Dysrhythmia  AFIB  . History of GI bleed    a. on ASA- started on plavix during 10/2015 admission for CVA  . HLD (hyperlipidemia)   . HTN (hypertension)   . Hypothyroidism   . Inappropriate therapy from implantable cardioverter-defibrillator    a. ATP for sinus tach SVT wavelet identified SVT but was no  passive (2014)  . Ischemic cardiomyopathy   . Melanoma of ear ([place])    a. L Ear   . Myocardial infarction    1992  . PAF (paroxysmal atrial fibrillation) ([place])   . PVD (peripheral vascular disease) ([place])     a. 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 Shortness of breath dyspnea    W/ EXERTION   . Sleep apnea    NO CPAP NOW  HE SENT IT BACK YRS AGO  . Stroke [facility])    a. 10/2015 admission   . Ventricular tachycardia ([place])    a. s/p AICD;   h/o ICD shock;  Amiodarone Rx. S/P ablation in 2012    Current Outpatient Prescriptions  Medication Sig Dispense Refill  . amiodarone (PACERONE) 200 MG tablet Take 200 mg by mouth daily.    [name] atorvastatin (LIPITOR) 80 MG tablet Take 80 mg by mouth daily at 6 PM.     . benzonatate (TESSALON) 100 MG capsule Take 1 capsule (100 mg total) by mouth 3 (three) times daily as needed for cough. 21 capsule 1  . clopidogrel (PLAVIX) 75 MG tablet Take 1 tablet (75 mg total) by mouth daily. 90 tablet 3  . docusate sodium (COLACE) 100 MG capsule Take 100 mg by mouth daily as needed for mild constipation.     . furosemide (LASIX) 20 MG tablet Take 1 tablet (20 mg total) by mouth as needed for edema (weight gain). 30 tablet 3  . levothyroxine (SYNTHROID, LEVOTHROID) 200 MCG tablet TAKE 1 TABLET DAILY BEFORE BREAKFAST WITH 1 & 1/2 TABLETS EVERY MONDAY. 45 tablet 3  . metoprolol succinate (TOPROL-XL) 25 MG 24 hr tablet Take 1 tablet (25 mg total) by mouth 2 (two) times daily. 180 tablet 3  . Multiple Vitamins-Minerals (ICAPS PO) Take 1 tablet by mouth 2 (two) times daily.    . nitroGLYCERIN (NITROSTAT) 0.4 MG SL tablet Place 0.4 mg under the tongue every 5 (five) minutes as needed for chest pain.    . pantoprazole (PROTONIX) 40 MG tablet Take 1 tablet (40 mg total) by mouth 2 (two) times daily. 60 tablet 6  . ranolazine (RANEXA) 500 MG 12 hr tablet TAKE 1 TABLET (500 MG TOTAL) BY MOUTH 2 (TWO) TIMES DAILY. 60 tablet 6  . warfarin (COUMADIN) 2 MG tablet TAKE AS DIRECTED 50 tablet 3  . zinc gluconate 50 MG tablet Take 50 mg by mouth 2 (two) times daily.     [name] doxycycline (VIBRAMYCIN) 100  MG capsule Take 1 capsule (100 mg total) by mouth 2 (two) times daily. Take one pill two times a day until gone. 14 capsule 0   No current facility-administered medications for this encounter.     Demerol [meperidine] and Opium  REVIEW OF SYSTEMS: All systems negative except as listed in HPI, PMH and Problem list.  Vital Signs:  Doppler Pressure: 72 Automatc BP: 96/74 (78) HR: 62 SPO2: 97 %  Weight: 2.6.8 lb w/ eqt Last weight: 206 lb Home Weight: 196 -  198 lbs.  Body mass index is 28.05 kg/m  Physical Exam: GENERAL: NAD. Ambulated in the clinic without difficulty  HEENT: normal NECK: Supple, JVP 6  no carotid bruits. R CEA scar well-healed No lymphadenopathy or thyromegaly appreciated.   CARDIAC:  Mechanical heart sounds with LVAD hum present.  LUNGS:  Clear ABDOMEN:  Soft, round, nontender, positive bowel sounds x4.   LVAD exit site: well-healed and incorporated.  Dressing dry and intact.  No erythema or drainage.  Stabilization device present and accurately applied.  Driveline dressing is being changed weekly per sterile technique. EXTREMITIES:  Warm and dry, no cyanosis, clubbing, no edema  NEUROLOGIC:  Alert and oriented x 4.  Gait steady.  No aphasia.  No dysarthria.  Affect pleasant. Grossly normal bilateral upper and lower extremity strength.   ASSESSMENT AND PLAN:  1. Chronic systolic HF: s/p HM-II LVAD 10/2014.  Remains NYHA II.   - Doing well.  NYHA II.  - LVAD speed reduced to 8800 in 10/17 due to RV failure. Recent low flow alarms now resolved.  - Volume status ok. Continue prn lasix.  2.  Atrial arrhythmias: Atrial fibrillation, atrial tachycardia.  S/p DC-CV 06/04/14, DC-CV 03/02/15 and 01/19/16.   -NSR today on ECG. Continue amio to 200 mg daily.  Watch for recurrent thyroid issues. Will consider decreasing to 100 daily at next visit. 3. HTN:  --MAP well-controlled. 4. LVAD: LVAD parameters reviewed personally. Low flows have resolved.  5. CKD, stage III:   Stable. Cr 1.75 today  6. Anticoagulation management: Goal INR 2-2.5  He is off ASA with GI bleeding. Continue Plavix. Uses home INR monitor. INR 2.53 today 7. Nephrolithiasis s/p Renal Stone lithotripsy: Per Dr Risa Grill. Stable. 8. 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 Continue coumadin and plavix.(with h/o CVA). HGb stable.  No bleeding problems currently.     9. Lumbar compression fracture: s/p L3-L5 fusion 02/03/16. Post-op wound has healed. Has f/u with Dr. Ladona Horns today.  10. 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  Continue atorvastatin. (also warfarin INR goal 2-2.5). S/p R CEA 01/13/16. Continue  plavix.  11. H/o amio-induced hypethyroidism: Followed by Dr. Forde Dandy. Amio restarted peri-operatively at low dose due to recurrent atrial arrhythmias. Recent TSH ok. Wean amio as tolerated 12. VT - underwent DC-CV 9/17. back on amio. Now quiescent.  13. Acute bronchitis. --now nearly 2 weeks. Start doxy 100 bid x 7 days.   Bensimhon, Daniel,MD 9:36 PM

## 2016-11-19 NOTE — Patient Instructions (Addendum)
No medication changes today.   Back to see your VAD Doctor in 2 months  Take you antibiotics until completed.

## 2016-11-21 LAB — POCT INR: INR: 2.8

## 2016-11-23 ENCOUNTER — Ambulatory Visit (HOSPITAL_COMMUNITY): Payer: Self-pay | Admitting: Pharmacist

## 2016-11-28 LAB — POCT INR: INR: 2.8

## 2016-11-29 ENCOUNTER — Ambulatory Visit (HOSPITAL_COMMUNITY): Payer: Self-pay | Admitting: Pharmacist

## 2016-12-05 ENCOUNTER — Other Ambulatory Visit: Payer: Self-pay | Admitting: Internal Medicine

## 2016-12-05 ENCOUNTER — Telehealth: Payer: Self-pay | Admitting: *Deleted

## 2016-12-05 ENCOUNTER — Other Ambulatory Visit (HOSPITAL_COMMUNITY): Payer: Self-pay | Admitting: Internal Medicine

## 2016-12-05 ENCOUNTER — Ambulatory Visit (HOSPITAL_COMMUNITY): Payer: Self-pay | Admitting: Infectious Diseases

## 2016-12-05 DIAGNOSIS — I5041 Acute combined systolic (congestive) and diastolic (congestive) heart failure: Secondary | ICD-10-CM

## 2016-12-05 DIAGNOSIS — Z95811 Presence of heart assist device: Secondary | ICD-10-CM

## 2016-12-05 DIAGNOSIS — K922 Gastrointestinal hemorrhage, unspecified: Secondary | ICD-10-CM

## 2016-12-05 LAB — POCT INR: INR: 2.3

## 2016-12-05 NOTE — Telephone Encounter (Signed)
Done

## 2016-12-10 ENCOUNTER — Other Ambulatory Visit (HOSPITAL_COMMUNITY): Payer: Self-pay | Admitting: Infectious Diseases

## 2016-12-10 MED ORDER — ATORVASTATIN CALCIUM 80 MG PO TABS: 80.0000 mg | ORAL_TABLET | Freq: Every day | ORAL | 6 refills | Status: DC

## 2016-12-12 ENCOUNTER — Ambulatory Visit (HOSPITAL_COMMUNITY): Payer: Self-pay | Admitting: Pharmacist

## 2016-12-12 LAB — POCT INR: INR: 2.3

## 2016-12-19 ENCOUNTER — Ambulatory Visit (HOSPITAL_COMMUNITY): Payer: Self-pay | Admitting: Pharmacist

## 2016-12-19 LAB — POCT INR: INR: 2

## 2016-12-31 ENCOUNTER — Ambulatory Visit (HOSPITAL_COMMUNITY): Payer: Self-pay | Admitting: Pharmacist

## 2016-12-31 LAB — POCT INR: INR: 2.9

## 2017-01-01 ENCOUNTER — Ambulatory Visit (INDEPENDENT_AMBULATORY_CARE_PROVIDER_SITE_OTHER): Payer: Medicare Other | Admitting: *Deleted

## 2017-01-01 DIAGNOSIS — I255 Ischemic cardiomyopathy: Secondary | ICD-10-CM | POA: Diagnosis not present

## 2017-01-01 NOTE — Progress Notes (Signed)
Remote ICD transmission.   

## 2017-01-02 ENCOUNTER — Encounter: Payer: Self-pay | Admitting: Cardiology

## 2017-01-03 LAB — CUP PACEART REMOTE DEVICE CHECK
Battery Remaining Longevity: 63 mo
Brady Statistic AP VP Percent: 0.17 %
Brady Statistic AP VS Percent: 68.75 %
Brady Statistic AS VP Percent: 0.07 %
Brady Statistic RA Percent Paced: 67.01 %
Brady Statistic RV Percent Paced: 0.3 %
HIGH POWER IMPEDANCE MEASURED VALUE: 37 Ohm
HighPow Impedance: 46 Ohm
Implantable Lead Implant Date: 20030714
Implantable Lead Location: 753859
Implantable Lead Model: 5592
Lead Channel Impedance Value: 361 Ohm
Lead Channel Impedance Value: 456 Ohm
Lead Channel Pacing Threshold Amplitude: 0.625 V
Lead Channel Pacing Threshold Amplitude: 1 V
Lead Channel Pacing Threshold Pulse Width: 0.4 ms
Lead Channel Sensing Intrinsic Amplitude: 2.25 mV
Lead Channel Setting Pacing Amplitude: 2 V
Lead Channel Setting Pacing Pulse Width: 0.4 ms
Lead Channel Setting Sensing Sensitivity: 0.3 mV
MDC IDC LEAD IMPLANT DT: 20140801
MDC IDC LEAD LOCATION: 753860
MDC IDC MSMT BATTERY VOLTAGE: 2.98 V
MDC IDC MSMT LEADCHNL RA PACING THRESHOLD PULSEWIDTH: 0.4 ms
MDC IDC MSMT LEADCHNL RA SENSING INTR AMPL: 1.5 mV
MDC IDC MSMT LEADCHNL RA SENSING INTR AMPL: 1.5 mV
MDC IDC MSMT LEADCHNL RV IMPEDANCE VALUE: 589 Ohm
MDC IDC MSMT LEADCHNL RV SENSING INTR AMPL: 2.25 mV
MDC IDC PG IMPLANT DT: 20140721
MDC IDC SESS DTM: 20180227131703
MDC IDC SET LEADCHNL RA PACING AMPLITUDE: 2 V
MDC IDC STAT BRADY AS VS PERCENT: 31.01 %

## 2017-01-08 ENCOUNTER — Ambulatory Visit (HOSPITAL_COMMUNITY): Payer: Self-pay | Admitting: Pharmacist

## 2017-01-08 LAB — POCT INR: INR: 2.8

## 2017-01-14 ENCOUNTER — Ambulatory Visit (HOSPITAL_COMMUNITY): Payer: Self-pay | Admitting: *Deleted

## 2017-01-14 LAB — POCT INR: INR: 2.2

## 2017-01-17 ENCOUNTER — Encounter (HOSPITAL_COMMUNITY): Payer: Medicare Other

## 2017-01-21 ENCOUNTER — Ambulatory Visit (HOSPITAL_COMMUNITY)
Admission: RE | Admit: 2017-01-21 | Discharge: 2017-01-21 | Disposition: A | Discharge status: Home / Self Care | Payer: Medicare Other | Source: Ambulatory Visit | Attending: Cardiology | Admitting: Cardiology

## 2017-01-21 ENCOUNTER — Ambulatory Visit (HOSPITAL_COMMUNITY): Payer: Self-pay | Admitting: Pharmacist

## 2017-01-21 VITALS — BP 94/0 | HR 70 | Ht 72.0 in | Wt 206.8 lb

## 2017-01-21 DIAGNOSIS — I471 Supraventricular tachycardia: Secondary | ICD-10-CM | POA: Diagnosis not present

## 2017-01-21 DIAGNOSIS — I480 Paroxysmal atrial fibrillation: Secondary | ICD-10-CM | POA: Insufficient documentation

## 2017-01-21 DIAGNOSIS — M4856XA Collapsed vertebra, not elsewhere classified, lumbar region, initial encounter for fracture: Secondary | ICD-10-CM | POA: Insufficient documentation

## 2017-01-21 DIAGNOSIS — I255 Ischemic cardiomyopathy: Secondary | ICD-10-CM | POA: Insufficient documentation

## 2017-01-21 DIAGNOSIS — Z9889 Other specified postprocedural states: Secondary | ICD-10-CM | POA: Insufficient documentation

## 2017-01-21 DIAGNOSIS — I428 Other cardiomyopathies: Secondary | ICD-10-CM | POA: Diagnosis not present

## 2017-01-21 DIAGNOSIS — Z8673 Personal history of transient ischemic attack (TIA), and cerebral infarction without residual deficits: Secondary | ICD-10-CM | POA: Diagnosis not present

## 2017-01-21 DIAGNOSIS — I130 Hypertensive heart and chronic kidney disease with heart failure and stage 1 through stage 4 chronic kidney disease, or unspecified chronic kidney disease: Secondary | ICD-10-CM | POA: Insufficient documentation

## 2017-01-21 DIAGNOSIS — Z7901 Long term (current) use of anticoagulants: Secondary | ICD-10-CM | POA: Insufficient documentation

## 2017-01-21 DIAGNOSIS — I472 Ventricular tachycardia, unspecified: Secondary | ICD-10-CM

## 2017-01-21 DIAGNOSIS — Z95811 Presence of heart assist device: Secondary | ICD-10-CM | POA: Insufficient documentation

## 2017-01-21 DIAGNOSIS — I5022 Chronic systolic (congestive) heart failure: Secondary | ICD-10-CM | POA: Diagnosis not present

## 2017-01-21 DIAGNOSIS — Z7902 Long term (current) use of antithrombotics/antiplatelets: Secondary | ICD-10-CM | POA: Insufficient documentation

## 2017-01-21 DIAGNOSIS — I5023 Acute on chronic systolic (congestive) heart failure: Secondary | ICD-10-CM

## 2017-01-21 DIAGNOSIS — N183 Chronic kidney disease, stage 3 (moderate): Secondary | ICD-10-CM | POA: Diagnosis not present

## 2017-01-21 DIAGNOSIS — I252 Old myocardial infarction: Secondary | ICD-10-CM | POA: Diagnosis not present

## 2017-01-21 DIAGNOSIS — Z79899 Other long term (current) drug therapy: Secondary | ICD-10-CM | POA: Diagnosis not present

## 2017-01-21 LAB — CBC
HEMATOCRIT: 38.5 % — AB (ref 39.0–52.0)
Hemoglobin: 12.7 g/dL — ABNORMAL LOW (ref 13.0–17.0)
MCH: 31.2 pg (ref 26.0–34.0)
MCHC: 33 g/dL (ref 30.0–36.0)
MCV: 94.6 fL (ref 78.0–100.0)
Platelets: 146 10*3/uL — ABNORMAL LOW (ref 150–400)
RBC: 4.07 MIL/uL — ABNORMAL LOW (ref 4.22–5.81)
RDW: 15.7 % — AB (ref 11.5–15.5)
WBC: 6.3 10*3/uL (ref 4.0–10.5)

## 2017-01-21 LAB — BASIC METABOLIC PANEL
ANION GAP: 7 (ref 5–15)
BUN: 36 mg/dL — AB (ref 6–20)
CALCIUM: 9.2 mg/dL (ref 8.9–10.3)
CO2: 24 mmol/L (ref 22–32)
Chloride: 105 mmol/L (ref 101–111)
Creatinine, Ser: 1.33 mg/dL — ABNORMAL HIGH (ref 0.61–1.24)
GFR calc Af Amer: 58 mL/min — ABNORMAL LOW (ref >60)
GFR calc non Af Amer: 50 mL/min — ABNORMAL LOW (ref >60)
GLUCOSE: 81 mg/dL (ref 65–99)
POTASSIUM: 4.4 mmol/L (ref 3.5–5.1)
Sodium: 136 mmol/L (ref 135–145)

## 2017-01-21 LAB — PROTIME-INR
INR: 2.07
Prothrombin Time: 23.7 seconds — ABNORMAL HIGH (ref 11.4–15.2)

## 2017-01-21 LAB — LACTATE DEHYDROGENASE: LDH: 312 U/L — AB (ref 98–192)

## 2017-01-21 NOTE — Progress Notes (Signed)
Patient ID: [name], male   DOB: 12/10/1940, 76 y.o.   MRN: [id number]   VAD CLINIC PROGRESS NOTE  PCP: Dr. Kandice Robinsons ([place]) GU: Dr Risa Grill  Neuro Surgeon: Dr Vertell Limber Vascular: Dr Kellie Simmering Primary HF: Dr Haroldine Laws  [name] is a 76 yo male with h/o VT, PAD and severe systolic HF (EF 46-50%) due to mixed ischemic/nonischemic CM he is s/p HM II LVAD implant for destination therapy on October 19, 2013.VAD implantation was complicated by VT, syncope, AF/Aflutter and RHF. Required short term milrinone.    GU Event 09/01/14 had impacted ureteral stone and underwent cystoscopy and flexible ureteroscopy with lithotripsy and basket extraction.   09/2015 Hematuria and chronic nephrolithiasis. Renal US showed mild hyronephrosis and bilateral renal cysts. Dr. Risa Grill took back for repeat lithotripsy and stone extraction by ureteroscopy and J stent placement.  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  01/2016 R CEA. He was started on Plavix.  01/2016 Lumbar fusion L3-L5 with pedicle screws posterolateral arthrodesis with BMP, allograft  Admitted 9/17 with high fevers and productive cough. CXR negative. Treated with levaquin x 7 days for acute bronchitis. Also found to have VT and underwent DC-CV. Seen by EP and amio increased  Had ramp echo in 10/17 speed turned down to 8800 due to RV failure.  Follow up LVAD Clinic: Presents for LVAD follow up. Here with his significant other [name]). Overall doing very well. Goes to gym regularly and rides stationary bike. Denies CP or undue SOB. Is about to go out and start playing golf again. Has mild edema. Takes lasix only as needed but hasn't taken for months. Has some arthritis but well controlled. Back pain much improved. Taking all medications as prescribed. No palpitations. .No bleeding, dark stools, neuro symptoms or dark urine.   VAD  Indication: Destination Therapy- age excluding   VAD interrogation & Equipment Management: Speed: 8800 Flow: 3.0  Power: 4.1 w PI: 5.4 - 6.8  Alarms: none   Events: 5 - 15 daily with outliers of 30 - 40  Fixed speed: 8800 Low speed limit: 8200  Primary Controller: Replace back up battery in 72months. Back up controller: Replace back up battery in 85months.  LVAD interrogated and functioning as expected    Past Medical History:  Diagnosis Date  . Arthritis   . Automatic implantable cardioverter-defibrillator in situ    a. s/p Medtronic Evera device serial [id number]    . Bradycardia   . CAD (coronary artery disease)    a. s/p MI 1982;  s/p DES to CFX 2011;  s/p NSTEMI 4/12 in setting of VTach;  cath 7/12:  LM ok, LAD mid 30-40%, Dx's with 40%, mCFX stent patent, prox to mid RCA occluded with R to R and L to R collats.  Medical Tx was continued  . Chronic systolic heart failure ([place])    a. s/p LVAD 10/2013 for destination therapy  . CKD (chronic kidney disease)   . CVD (cerebrovascular disease)    a. s/p L CEA 2008  . Cytopenia   . Dysrhythmia    AFIB  . History of GI bleed    a. on ASA- started on plavix during 10/2015 admission for CVA  . HLD (hyperlipidemia)   . HTN (hypertension)   . Hypothyroidism   . Inappropriate therapy from implantable cardioverter-defibrillator    a. ATP for sinus tach  SVT wavelet identified SVT but was no  passive (2014)  . Ischemic cardiomyopathy   . 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 L Ear   . Myocardial infarction    1992  . PAF (paroxysmal atrial fibrillation) ([place])   . PVD (peripheral vascular disease) ([place])    a. 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 Shortness of breath dyspnea    W/ EXERTION   . Sleep apnea    NO CPAP NOW  HE SENT IT BACK YRS AGO  . Stroke [facility])    a. 10/2015 admission   . Ventricular tachycardia ([place])    a.  s/p AICD;   h/o ICD shock;  Amiodarone Rx. S/P ablation in 2012    Current Outpatient Prescriptions  Medication Sig Dispense Refill  . amiodarone (PACERONE) 200 MG tablet Take 200 mg by mouth daily.    [name] atorvastatin (LIPITOR) 80 MG tablet Take 1 tablet (80 mg total) by mouth daily at 6 PM. 30 tablet 6  . benzonatate (TESSALON) 100 MG capsule Take 1 capsule (100 mg total) by mouth 3 (three) times daily as needed for cough. 21 capsule 1  . clopidogrel (PLAVIX) 75 MG tablet Take 1 tablet (75 mg total) by mouth daily. 90 tablet 3  . docusate sodium (COLACE) 100 MG capsule Take 100 mg by mouth daily as needed for mild constipation.     [name] levothyroxine (SYNTHROID, LEVOTHROID) 200 MCG tablet TAKE 1 TABLET DAILY BEFORE BREAKFAST WITH 1 & 1/2 TABLETS EVERY MONDAY. 45 tablet 3  . metoprolol succinate (TOPROL-XL) 25 MG 24 hr tablet Take 1 tablet (25 mg total) by mouth 2 (two) times daily. 180 tablet 3  . Multiple Vitamins-Minerals (ICAPS PO) Take 1 tablet by mouth 2 (two) times daily.    . pantoprazole (PROTONIX) 40 MG tablet TAKE 1 TABLET TWICE DAILY 60 tablet 3  . ranolazine (RANEXA) 500 MG 12 hr tablet TAKE 1 TABLET (500 MG TOTAL) BY MOUTH 2 (TWO) TIMES DAILY. 60 tablet 6  . zinc gluconate 50 MG tablet Take 50 mg by mouth 2 (two) times daily.     . furosemide (LASIX) 20 MG tablet Take 1 tablet (20 mg total) by mouth as needed for edema (weight gain). (Patient not taking: Reported on 01/21/2017) 30 tablet 3  . nitroGLYCERIN (NITROSTAT) 0.4 MG SL tablet Place 0.4 mg under the tongue every 5 (five) minutes as needed for chest pain.    [name] warfarin (COUMADIN) 2 MG tablet TAKE AS DIRECTED 50 tablet 3   No current facility-administered medications for this encounter.     Demerol [meperidine] and Opium  REVIEW OF SYSTEMS: All systems negative except as listed in HPI, PMH and Problem list.  Vital Signs:  Doppler Pressure: 94 Automatc BP: 100/68 (65) HR: 70 SPO2: 98 %  Weight: 206.6 lb w/ eqt Last  weight: 206.8 lb Home Weight: 196 - 198 lbs.   Physical Exam: GENERAL: NAD. Here with [name] Ambulated in the clinic without difficulty  HEENT: normal NECK: Supple, JVP 8-9  no carotid bruits. R CEA scar well-healed No lymphadenopathy or thyromegaly appreciated.   CARDIAC:  Mechanical heart sounds. LVAD hum present LUNGS:  Clear. No wheeze ABDOMEN:  Soft, round, nontender, non distended. Good bowel sounds LVAD exit site: well-healed and incorporated.  Dressing C/D/I  No erythema or drainage.  Stabilization device  present and accurately applied.  Driveline dressing is being changed weekly per sterile technique. EXTREMITIES:  Warm and dry. Mo c/c. 1+ edema R>L NEUROLOGIC:  Alert and oriented x 4.  Gait steady.  Cranial nerves intact. Grossly normal bilateral upper and lower extremity strength.   ASSESSMENT AND PLAN:  1. Chronic systolic HF: s/p HM-II LVAD 10/2014.  Remains NYHA II.   - Doing well.  NYHA II.  - LVAD speed reduced to 8800 in 10/17 due to RV failure. Low flow alarms now resolved.  - Volume status mildly elevated with R>L HF. Continue prn lasix.  2.  Atrial arrhythmias: Atrial fibrillation, atrial tachycardia.  S/p DC-CV 06/04/14, DC-CV 03/02/15 and 01/19/16.   -Has been in NSR. Continue amio to 200 mg daily.  Watch for recurrent thyroid issues. - At next visit consider dropping dose to 100mg  daily. 3. HTN:  --MAP well-controlled on current regimen. Will continue 4. LVAD: LVAD parameters reviewed personally. Low flows have resolved with turning speed down  5. CKD, stage III:   --Stable. Cr improved to 1.33 today  6. Anticoagulation management:  --Goal INR 2-2.5  He is off ASA with GI bleeding. Continue Plavix.  --Uses home INR monitor. INR 2.07 today. I discussed with PharmD 7. H/o GIB:  --s/p clipping of 2 jejunal AVMs in 4/16. Continue coumadin and plavix.(with h/o CVA). --HGb stable at 12.7  No bleeding problems currently.     8. Lumbar compression fracture:  s/p L3-L5  fusion 02/03/16. --back pain resolved 9. 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  Continue atorvastatin. (also warfarin INR goal 2-2.5). S/p R CEA 01/13/16. Continue  plavix.  11. H/o amio-induced hypethyroidism:  --Followed by Dr. Forde Dandy in past.  --Amio restarted peri-operatively at low dose due to recurrent atrial arrhythmias. --Recent TSH ok. Wean amio as tolerated. Consider going to 100mg  daily at next visit 12. VT  - underwent DC-CV 9/17. back on amio. Now quiescent.    Bensimhon, Daniel,MD 9:43 PM

## 2017-01-21 NOTE — Progress Notes (Signed)
Patient presents for follow up in [facility] today. Reports no problems with VAD equipment or concerns with drive line.  Vital Signs:  Doppler Pressure: 94 Automatc BP: 100/68 (65) HR:  70 SPO2: 98 %  Weight: 206.6 lb w/ eqt Last weight: 206.8 lb Home Weight: 196 - 198 lbs.   VAD Indication: Destination Therapy - age excluding   VAD interrogation & Equipment Management: Speed: 8800 Flow: 3.0 with frequent sustained --- Power: 4.1 w    PI: 5.4 - 6.8  Alarms: none   Events: 5 - 15 daily with outliers of 30 - 40  Fixed speed: 8800 Low speed limit: 8200  Primary Controller:  Replace back up battery in 48months. Back up controller:   Replace back up battery in 40months.  I reviewed the LVAD parameters from today and compared the results to the patient's prior recorded data reviewed with Dr. Haroldine Laws. LVAD interrogation was NEGATIVE for significant power changes, NEGATIVE for clinical alarms and STABLE for PI events/speed drops. No programming changes were made and pump is functioning within specified parameters. Pt is performing daily controller and system monitor self tests along with completing weekly and monthly maintenance for LVAD equipment.  LVAD equipment check completed and is in good working order. Back-up equipment present. Charged back up battery and performed self-test on equipment.   Annual Equipment Maintenance on UBC/PM on December 2017.  Exit Site Care: Drive line is being maintained weekly by [name]. Drive line exit site well healed and incorporated. The velour is fully implanted at exit site. Dressing dry and intact. No erythema or drainage. Stabilization device present and accurately applied. Pt denies fever or chills. Pt given 4 weekly dressing kits.   Significant Events on VAD Support:  02/2015 >> GIB (removed ASA, 2-2.5 INR) 10/2015 >> CVA (Plavix started)  Device: Medtronic  Therapies: On Last check: unknown  BP & Labs:  MAP 94 - Doppler is  reflecting modified systolic.   Hgb 12.7   - No S/S of bleeding. Specifically denies melena/BRBPR or nosebleeds.  LDH stable at 312 with established baseline of 250 - 280. Denies tea-colored urine. No power elevations noted on interrogation.    Tanda Rockers, RN VAD Coordinator   Office: [phone] 24/7 Emergency VAD Pager: [phone]

## 2017-01-22 ENCOUNTER — Ambulatory Visit (HOSPITAL_COMMUNITY): Payer: Self-pay | Admitting: Pharmacist

## 2017-01-22 LAB — POCT INR: INR: 2

## 2017-01-28 ENCOUNTER — Ambulatory Visit (HOSPITAL_COMMUNITY): Payer: Self-pay | Admitting: Pharmacist

## 2017-01-28 LAB — POCT INR: INR: 2

## 2017-02-04 ENCOUNTER — Other Ambulatory Visit (HOSPITAL_COMMUNITY): Payer: Self-pay | Admitting: Unknown Physician Specialty

## 2017-02-04 ENCOUNTER — Ambulatory Visit (HOSPITAL_COMMUNITY): Payer: Self-pay | Admitting: Unknown Physician Specialty

## 2017-02-04 ENCOUNTER — Other Ambulatory Visit: Payer: Self-pay | Admitting: Internal Medicine

## 2017-02-04 DIAGNOSIS — E039 Hypothyroidism, unspecified: Secondary | ICD-10-CM

## 2017-02-04 LAB — POCT INR: INR: 1.6

## 2017-02-04 MED ORDER — LEVOTHYROXINE SODIUM 200 MCG PO TABS: ORAL_TABLET | ORAL | 6 refills | Status: DC

## 2017-02-07 ENCOUNTER — Ambulatory Visit (HOSPITAL_COMMUNITY): Payer: Self-pay | Admitting: Pharmacist

## 2017-02-07 LAB — POCT INR: INR: 1.5

## 2017-02-07 MED ORDER — ENOXAPARIN SODIUM 60 MG/0.6ML ~~LOC~~ SOLN
50.0000 mg | Freq: Two times a day (BID) | SUBCUTANEOUS | 1 refills | Status: DC
Start: 2017-02-07 — End: 2017-02-21

## 2017-02-07 NOTE — Progress Notes (Signed)
Will start on 1/2 dose lovenox per discussion with Dr. Haroldine Laws.   Ruta Hinds. [name], PharmD, BCPS, CPP Clinical Pharmacist Pager: [phone] Phone: [phone] 02/07/2017 12:06 PM

## 2017-02-11 ENCOUNTER — Ambulatory Visit (HOSPITAL_COMMUNITY): Payer: Self-pay | Admitting: Pharmacist

## 2017-02-11 LAB — POCT INR: INR: 2.7

## 2017-02-18 ENCOUNTER — Ambulatory Visit (HOSPITAL_COMMUNITY): Payer: Self-pay | Admitting: Pharmacist

## 2017-02-18 LAB — POCT INR: INR: 2.7

## 2017-02-20 ENCOUNTER — Inpatient Hospital Stay (HOSPITAL_COMMUNITY)
Admission: EM | Admit: 2017-02-20 | Discharge: 2017-02-27 | DRG: 378 | Disposition: A | Discharge status: Home / Self Care | Payer: Medicare Other | Attending: Internal Medicine | Admitting: Internal Medicine

## 2017-02-20 ENCOUNTER — Emergency Department (HOSPITAL_COMMUNITY): Payer: Medicare Other

## 2017-02-20 ENCOUNTER — Encounter (HOSPITAL_COMMUNITY): Payer: Self-pay | Admitting: Emergency Medicine

## 2017-02-20 DIAGNOSIS — I252 Old myocardial infarction: Secondary | ICD-10-CM | POA: Diagnosis not present

## 2017-02-20 DIAGNOSIS — Z8249 Family history of ischemic heart disease and other diseases of the circulatory system: Secondary | ICD-10-CM

## 2017-02-20 DIAGNOSIS — Z7901 Long term (current) use of anticoagulants: Secondary | ICD-10-CM

## 2017-02-20 DIAGNOSIS — N183 Chronic kidney disease, stage 3 unspecified: Secondary | ICD-10-CM | POA: Diagnosis present

## 2017-02-20 DIAGNOSIS — D649 Anemia, unspecified: Secondary | ICD-10-CM

## 2017-02-20 DIAGNOSIS — Z7902 Long term (current) use of antithrombotics/antiplatelets: Secondary | ICD-10-CM

## 2017-02-20 DIAGNOSIS — Z95810 Presence of automatic (implantable) cardiac defibrillator: Secondary | ICD-10-CM | POA: Diagnosis not present

## 2017-02-20 DIAGNOSIS — I739 Peripheral vascular disease, unspecified: Secondary | ICD-10-CM | POA: Diagnosis present

## 2017-02-20 DIAGNOSIS — K648 Other hemorrhoids: Secondary | ICD-10-CM | POA: Diagnosis present

## 2017-02-20 DIAGNOSIS — Z7982 Long term (current) use of aspirin: Secondary | ICD-10-CM

## 2017-02-20 DIAGNOSIS — K922 Gastrointestinal hemorrhage, unspecified: Secondary | ICD-10-CM | POA: Diagnosis not present

## 2017-02-20 DIAGNOSIS — I5022 Chronic systolic (congestive) heart failure: Secondary | ICD-10-CM | POA: Diagnosis present

## 2017-02-20 DIAGNOSIS — K921 Melena: Secondary | ICD-10-CM | POA: Diagnosis not present

## 2017-02-20 DIAGNOSIS — Z823 Family history of stroke: Secondary | ICD-10-CM

## 2017-02-20 DIAGNOSIS — I2510 Atherosclerotic heart disease of native coronary artery without angina pectoris: Secondary | ICD-10-CM | POA: Diagnosis present

## 2017-02-20 DIAGNOSIS — D62 Acute posthemorrhagic anemia: Secondary | ICD-10-CM | POA: Diagnosis present

## 2017-02-20 DIAGNOSIS — G4730 Sleep apnea, unspecified: Secondary | ICD-10-CM | POA: Diagnosis present

## 2017-02-20 DIAGNOSIS — I959 Hypotension, unspecified: Secondary | ICD-10-CM | POA: Diagnosis present

## 2017-02-20 DIAGNOSIS — I130 Hypertensive heart and chronic kidney disease with heart failure and stage 1 through stage 4 chronic kidney disease, or unspecified chronic kidney disease: Secondary | ICD-10-CM | POA: Diagnosis present

## 2017-02-20 DIAGNOSIS — I255 Ischemic cardiomyopathy: Secondary | ICD-10-CM | POA: Diagnosis present

## 2017-02-20 DIAGNOSIS — Z85820 Personal history of malignant melanoma of skin: Secondary | ICD-10-CM

## 2017-02-20 DIAGNOSIS — E785 Hyperlipidemia, unspecified: Secondary | ICD-10-CM | POA: Diagnosis present

## 2017-02-20 DIAGNOSIS — Z8673 Personal history of transient ischemic attack (TIA), and cerebral infarction without residual deficits: Secondary | ICD-10-CM

## 2017-02-20 DIAGNOSIS — Z955 Presence of coronary angioplasty implant and graft: Secondary | ICD-10-CM | POA: Diagnosis not present

## 2017-02-20 DIAGNOSIS — K449 Diaphragmatic hernia without obstruction or gangrene: Secondary | ICD-10-CM | POA: Diagnosis present

## 2017-02-20 DIAGNOSIS — K5730 Diverticulosis of large intestine without perforation or abscess without bleeding: Secondary | ICD-10-CM | POA: Diagnosis present

## 2017-02-20 DIAGNOSIS — E039 Hypothyroidism, unspecified: Secondary | ICD-10-CM | POA: Diagnosis present

## 2017-02-20 DIAGNOSIS — Z95811 Presence of heart assist device: Secondary | ICD-10-CM | POA: Diagnosis not present

## 2017-02-20 DIAGNOSIS — I480 Paroxysmal atrial fibrillation: Secondary | ICD-10-CM | POA: Diagnosis present

## 2017-02-20 DIAGNOSIS — E869 Volume depletion, unspecified: Secondary | ICD-10-CM | POA: Diagnosis present

## 2017-02-20 DIAGNOSIS — Z87891 Personal history of nicotine dependence: Secondary | ICD-10-CM

## 2017-02-20 LAB — PROTIME-INR
INR: 2.38
Prothrombin Time: 26.4 seconds — ABNORMAL HIGH (ref 11.4–15.2)

## 2017-02-20 LAB — COMPREHENSIVE METABOLIC PANEL
ALBUMIN: 3.9 g/dL (ref 3.5–5.0)
ALK PHOS: 109 U/L (ref 38–126)
ALT: 23 U/L (ref 17–63)
AST: 30 U/L (ref 15–41)
Anion gap: 7 (ref 5–15)
BUN: 48 mg/dL — AB (ref 6–20)
CALCIUM: 8.9 mg/dL (ref 8.9–10.3)
CO2: 23 mmol/L (ref 22–32)
CREATININE: 1.5 mg/dL — AB (ref 0.61–1.24)
Chloride: 104 mmol/L (ref 101–111)
GFR calc Af Amer: 50 mL/min — ABNORMAL LOW (ref >60)
GFR calc non Af Amer: 43 mL/min — ABNORMAL LOW (ref >60)
Glucose, Bld: 121 mg/dL — ABNORMAL HIGH (ref 65–99)
Potassium: 4.2 mmol/L (ref 3.5–5.1)
SODIUM: 134 mmol/L — AB (ref 135–145)
Total Bilirubin: 0.7 mg/dL (ref 0.3–1.2)
Total Protein: 6.8 g/dL (ref 6.5–8.1)

## 2017-02-20 LAB — CBC WITH DIFFERENTIAL/PLATELET
BASOS PCT: 1 %
Basophils Absolute: 0 10*3/uL (ref 0.0–0.1)
EOS ABS: 0.2 10*3/uL (ref 0.0–0.7)
Eosinophils Relative: 4 %
HEMATOCRIT: 30.2 % — AB (ref 39.0–52.0)
HEMOGLOBIN: 10 g/dL — AB (ref 13.0–17.0)
Lymphocytes Relative: 27 %
Lymphs Abs: 1.4 10*3/uL (ref 0.7–4.0)
MCH: 31.5 pg (ref 26.0–34.0)
MCHC: 33.1 g/dL (ref 30.0–36.0)
MCV: 95.3 fL (ref 78.0–100.0)
Monocytes Absolute: 0.6 10*3/uL (ref 0.1–1.0)
Monocytes Relative: 11 %
NEUTROS ABS: 2.9 10*3/uL (ref 1.7–7.7)
NEUTROS PCT: 57 %
Platelets: 164 10*3/uL (ref 150–400)
RBC: 3.17 MIL/uL — AB (ref 4.22–5.81)
RDW: 16.5 % — ABNORMAL HIGH (ref 11.5–15.5)
WBC: 5.2 10*3/uL (ref 4.0–10.5)

## 2017-02-20 LAB — POC OCCULT BLOOD, ED: Fecal Occult Bld: POSITIVE — AB

## 2017-02-20 LAB — LACTATE DEHYDROGENASE: LDH: 288 U/L — ABNORMAL HIGH (ref 98–192)

## 2017-02-20 LAB — TROPONIN I: Troponin I: <0.03 ng/mL (ref <0.03)

## 2017-02-20 MED ORDER — ONDANSETRON HCL 4 MG/2ML IJ SOLN: 4.0000 mg | Freq: Four times a day (QID) | INTRAMUSCULAR | Status: DC | PRN

## 2017-02-20 MED ORDER — LEVOTHYROXINE SODIUM 100 MCG PO TABS
300.0000 ug | ORAL_TABLET | ORAL | Status: DC
  Administered 2017-02-25: 300 ug via ORAL
  Filled 2017-02-20: qty 3

## 2017-02-20 MED ORDER — METOPROLOL SUCCINATE ER 25 MG PO TB24
25.0000 mg | ORAL_TABLET | Freq: Two times a day (BID) | ORAL | Status: DC
  Administered 2017-02-21 – 2017-02-27 (×13): 25 mg via ORAL
  Filled 2017-02-20 (×13): qty 1

## 2017-02-20 MED ORDER — ACETAMINOPHEN 325 MG PO TABS: 650.0000 mg | ORAL_TABLET | ORAL | Status: DC | PRN

## 2017-02-20 MED ORDER — PANTOPRAZOLE SODIUM 40 MG IV SOLR
40.0000 mg | INTRAVENOUS | Status: DC
  Administered 2017-02-20 – 2017-02-23 (×4): 40 mg via INTRAVENOUS
  Filled 2017-02-20 (×4): qty 40

## 2017-02-20 MED ORDER — AMIODARONE HCL 200 MG PO TABS
200.0000 mg | ORAL_TABLET | Freq: Every day | ORAL | Status: DC
Start: 2017-02-21 — End: 2017-02-27
  Administered 2017-02-21 – 2017-02-27 (×7): 200 mg via ORAL
  Filled 2017-02-20 (×7): qty 1

## 2017-02-20 MED ORDER — LEVOTHYROXINE SODIUM 100 MCG PO TABS: 200.0000 ug | ORAL_TABLET | Freq: Every day | ORAL | Status: DC

## 2017-02-20 MED ORDER — ATORVASTATIN CALCIUM 80 MG PO TABS
80.0000 mg | ORAL_TABLET | Freq: Every day | ORAL | Status: DC
Start: 2017-02-21 — End: 2017-02-27
  Administered 2017-02-21 – 2017-02-26 (×6): 80 mg via ORAL
  Filled 2017-02-20 (×6): qty 1

## 2017-02-20 MED ORDER — LEVOTHYROXINE SODIUM 100 MCG PO TABS
200.0000 ug | ORAL_TABLET | ORAL | Status: DC
  Administered 2017-02-22 – 2017-02-27 (×4): 200 ug via ORAL
  Filled 2017-02-20 (×5): qty 2

## 2017-02-20 MED ORDER — SODIUM CHLORIDE 0.9 % IV BOLUS (SEPSIS)
1000.0000 mL | Freq: Once | INTRAVENOUS | Status: AC
  Administered 2017-02-20: 1000 mL via INTRAVENOUS

## 2017-02-20 MED ORDER — RANOLAZINE ER 500 MG PO TB12
500.0000 mg | ORAL_TABLET | Freq: Two times a day (BID) | ORAL | Status: DC
  Administered 2017-02-21 – 2017-02-27 (×13): 500 mg via ORAL
  Filled 2017-02-20 (×13): qty 1

## 2017-02-20 NOTE — H&P (Signed)
VAD TEAM History & Physical Note   Reason for Admission: Acute GI bleeding/symptomatic anemia   HPI:    [name] is a 76 yo male with h/o VT, PAD and severe systolic HF (EF 93-26%) due to mixed ischemic/nonischemic CM he is s/p HM II LVAD implant for destination therapy on October 19, 2013.VAD implantation was complicated by VT, syncope, AF/Aflutter and RHF. Required short term milrinone.    GU Event 09/01/14 had impacted ureteral stone and underwent cystoscopy and flexible ureteroscopy with lithotripsy and basket extraction.   09/2015 Hematuria and chronic nephrolithiasis. Renal US showed mild hyronephrosis and bilateral renal cysts. Dr. Risa Grill took back for repeat lithotripsy and stone extraction by ureteroscopy and J stent placement.  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  01/2016 R CEA. He was started on Plavix.  01/2016 Lumbar fusion L3-L5 with pedicle screws posterolateral arthrodesis with BMP, allograft  Admitted 9/17 with high fevers and productive cough. CXR negative. Treated with levaquin x 7 days for acute bronchitis. Also found to have VT and underwent DC-CV. Seen by EP and amio increased  Had ramp echo in 10/17 speed turned down to 8800 due to RV failure.  Has been doing very well. Yesterday noted that he felt dizzy and weak in the am but it resolved. This am felt dizzy and weak again but didn't resolve. Noted several low flow alarms on VAD sso came to ER. In ER found to have melena in rectal vault. Hgb 12.7 -> 10.0. MAP 95. On LVAD multiple low flow alarms and innumerable PI events over past 2 days. ECG with NSR.   Denies belly pain or frank bleeding. Last stool yesterday was normal. INR 2.38. LDH ok.   VAD Indication: Destination Therapy- age excluding   VAD interrogation & Equipment Management: Speed: 8800 Flow: 3.5 Power: 4.3  w PI: 6.9  Alarms: 7 low flow events Events: > 40 per day x 2 days  Fixed speed: 8800 Low speed limit: 8200   Review of Systems: [y] = yes, [ ]  = no   General: Weight gain [ ] ; Weight loss [ ] ; Anorexia [ ] ; Fatigue Blue.Reese ]; Fever [ ] ; Chills [ ] ; Weakness Blue.Reese ]  Cardiac: Chest pain/pressure [ ] ; Resting SOB [ ] ; Exertional SOB [ ] ; Orthopnea [ ] ; Pedal Edema [ ] ; Palpitations [ ] ; Syncope [ ] ; Presyncope [ ] ; Paroxysmal nocturnal dyspnea[ ]   Pulmonary: Cough [ ] ; Wheezing[ ] ; Hemoptysis[ ] ; Sputum [ ] ; Snoring [ ]   GI: Vomiting[ ] ; Dysphagia[ ] ; Melena[ ] ; Hematochezia [ ] ; Heartburn[ ] ; Abdominal pain [ ] ; Constipation [ ] ; Diarrhea [ ] ; BRBPR [ ]   GU: Hematuria[ ] ; Dysuria [ ] ; Nocturia[ ]   Vascular: Pain in legs with walking [ ] ; Pain in feet with lying flat [ ] ; Non-healing sores [ ] ; Stroke [ ] ; TIA [ ] ; Slurred speech [ ] ;  Neuro: Headaches[ ] ; Vertigo[ ] ; Seizures[ ] ; Paresthesias[ ] ;Blurred vision [ ] ; Diplopia [ ] ; Vision changes [ ]   Ortho/Skin: Arthritis [ y]; Joint pain [ ] ; Muscle pain [ ] ; Joint swelling [ ] ; Back Pain Blue.Reese ]; Rash [ ]   Psych: Depression[ ] ; Anxiety[ ]   Heme: Bleeding problems Blue.Reese ]; Clotting disorders [ ] ; Anemia [ y]  Endocrine: Diabetes [ ] ; Thyroid dysfunction[ ]   Home Medications Prior to Admission  medications   Medication Sig Start Date End Date Taking? Authorizing Provider  amiodarone (PACERONE) 200 MG tablet Take 200 mg by mouth daily.    Historical Provider, MD  atorvastatin (LIPITOR) 80 MG tablet Take 1 tablet (80 mg total) by mouth daily at 6 PM. 12/10/16   Jolaine Artist, MD  benzonatate (TESSALON) 100 MG capsule Take 1 capsule (100 mg total) by mouth 3 (three) times daily as needed for cough. 07/30/16   Jolaine Artist, MD  clopidogrel (PLAVIX) 75 MG tablet Take 1 tablet (75 mg total) by mouth daily. 08/27/16   Jolaine Artist, MD  docusate sodium (COLACE) 100 MG capsule Take 100 mg by mouth daily as needed for mild constipation.      Historical Provider, MD  enoxaparin (LOVENOX) 60 MG/0.6ML injection Inject 0.5 mLs (50 mg total) into the skin every 12 (twelve) hours. 02/07/17   Jolaine Artist, MD  furosemide (LASIX) 20 MG tablet Take 1 tablet (20 mg total) by mouth as needed for edema (weight gain). Patient not taking: Reported on 01/21/2017 11/14/15   Jolaine Artist, MD  levothyroxine (SYNTHROID, LEVOTHROID) 200 MCG tablet TAKE 1 TABLET DAILY BEFORE BREAKFAST WITH 1 & 1/2 TABLETS EVERY MONDAY. 02/04/17   Jolaine Artist, MD  metoprolol succinate (TOPROL-XL) 25 MG 24 hr tablet Take 1 tablet (25 mg total) by mouth 2 (two) times daily. 05/14/16   Larey Dresser, MD  Multiple Vitamins-Minerals (ICAPS PO) Take 1 tablet by mouth 2 (two) times daily.    Historical Provider, MD  nitroGLYCERIN (NITROSTAT) 0.4 MG SL tablet Place 0.4 mg under the tongue every 5 (five) minutes as needed for chest pain.    Historical Provider, MD  pantoprazole (PROTONIX) 40 MG tablet TAKE 1 TABLET TWICE DAILY 12/05/16   Jolaine Artist, MD  ranolazine (RANEXA) 500 MG 12 hr tablet TAKE 1 TABLET (500 MG TOTAL) BY MOUTH 2 (TWO) TIMES DAILY. 10/26/16   Jolaine Artist, MD  warfarin (COUMADIN) 2 MG tablet TAKE AS DIRECTED 12/05/16   Jolaine Artist, MD  zinc gluconate 50 MG tablet Take 50 mg by mouth 2 (two) times daily.     Historical Provider, MD    Past Medical History: Past Medical History:  Diagnosis Date  . Arthritis   . Automatic implantable cardioverter-defibrillator in situ    a. s/p Medtronic Evera device serial [id number]    . Bradycardia   . CAD (coronary artery disease)    a. s/p MI 1982;  s/p DES to CFX 2011;  s/p NSTEMI 4/12 in setting of VTach;  cath 7/12:  LM ok, LAD mid 30-40%, Dx's with 40%, mCFX stent patent, prox to mid RCA occluded with R to R and L to R collats.  Medical Tx was continued  . Chronic systolic heart failure ([place])    a. s/p LVAD 10/2013 for destination therapy  . CKD (chronic kidney disease)   . CVD  (cerebrovascular disease)    a. s/p L CEA 2008  . Cytopenia   . Dysrhythmia    AFIB  . History of GI bleed    a. on ASA- started on plavix during 10/2015 admission for CVA  . HLD (hyperlipidemia)   . HTN (hypertension)   . Hypothyroidism   . Inappropriate therapy from implantable cardioverter-defibrillator    a. ATP for sinus tach SVT wavelet identified SVT but was no  passive (2014)  . Ischemic cardiomyopathy   . 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  L Ear   . Myocardial infarction ([place])    1992  . PAF (paroxysmal atrial fibrillation) ([place])   . PVD (peripheral vascular disease) ([place])    a. 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 Shortness of breath dyspnea    W/ EXERTION   . Sleep apnea    NO CPAP NOW  HE SENT IT BACK YRS AGO  . Stroke [facility])    a. 10/2015 admission   . Ventricular tachycardia ([place])    a. s/p AICD;   h/o ICD shock;  Amiodarone Rx. S/P ablation in 2012    Past Surgical History: Past Surgical History:  Procedure Laterality Date  . BACK SURGERY    . CARDIAC CATHETERIZATION     "several" (05/25/2013)  . CARDIAC DEFIBRILLATOR PLACEMENT  2003; 2005; 05/25/2013   2014: Medtronic Evera device serial number [id number]  . CARDIAC ELECTROPHYSIOLOGY STUDY AND ABLATION  2012  . CARDIOVERSION N/A 10/15/2013   Procedure: CARDIOVERSION;  Surgeon: Jolaine Artist, MD;  Location: [place];  Service: Cardiovascular;  Laterality: N/A;  . CARDIOVERSION N/A 11/04/2013   Procedure: CARDIOVERSION;  Surgeon: Larey Dresser, MD;  Location: [place];  Service: Cardiovascular;  Laterality: N/A;  . CARDIOVERSION N/A 06/04/2014   Procedure: CARDIOVERSION;  Surgeon: Jolaine Artist, MD;  Location: [facility] ENDOSCOPY;  Service: Cardiovascular;  Laterality: N/A;  . CARDIOVERSION N/A 03/02/2015   Procedure: CARDIOVERSION;  Surgeon: Jolaine Artist, MD;  Location: [facility] ENDOSCOPY;  Service: Cardiovascular;   Laterality: N/A;  . CARDIOVERSION N/A 01/19/2016   Procedure: CARDIOVERSION;  Surgeon: Larey Dresser, MD;  Location: [place];  Service: Cardiovascular;  Laterality: N/A;  . CAROTID ENDARTERECTOMY Left ~ 2007  . CATARACT EXTRACTION W/ INTRAOCULAR LENS  IMPLANT, BILATERAL Bilateral 2000  . CHOLECYSTECTOMY  12/15/?2010  . CORONARY ANGIOPLASTY  1992  . CORONARY ANGIOPLASTY WITH STENT PLACEMENT     "last one was 11/2012  (05/25/2013)  . CYSTOSCOPY WITH RETROGRADE PYELOGRAM, URETEROSCOPY AND STENT PLACEMENT Left 08/09/2014   Procedure: CYSTOSCOPY WITH RETROGRADE PYELOGRAM, URETEROSCOPY AND STENT PLACEMENT;  Surgeon: Bernestine Amass, MD;  Location: [facility];  Service: Urology;  Laterality: Left;  . CYSTOSCOPY WITH RETROGRADE PYELOGRAM, URETEROSCOPY AND STENT PLACEMENT Left 09/01/2014   Procedure: CYSTOSCOPY WITH URETEROSCOPY AND STENT REMOVAL;  Surgeon: Bernestine Amass, MD;  Location: [facility];  Service: Urology;  Laterality: Left;  . CYSTOSCOPY WITH RETROGRADE PYELOGRAM, URETEROSCOPY AND STENT PLACEMENT Left 09/20/2014   Procedure: CYSTOSCOPY WITH RETROGRADE PYELOGRAM, URETEROSCOPY AND STENT PLACEMENT, stone removal;  Surgeon: Bernestine Amass, MD;  Location: [facility];  Service: Urology;  Laterality: Left;  . DOPPLER ECHOCARDIOGRAPHY  2011  . ELBOW ARTHROSCOPY Right 1990's  . ELECTROPHYSIOLOGIC STUDY N/A 07/19/2016   Procedure: Cardioversion;  Surgeon: Evans Lance, MD;  Location: [place] CV LAB;  Service: Cardiovascular;  Laterality: N/A;  . ENDARTERECTOMY Right 01/13/2016   Procedure: RIGHT CAROTID ARTERY ENDARTERECTOMY;  Surgeon: Mal Misty, MD;  Location: [place];  Service: Vascular;  Laterality: Right;  . ESOPHAGOGASTRODUODENOSCOPY N/A 02/15/2015   Procedure: ESOPHAGOGASTRODUODENOSCOPY (EGD);  Surgeon: Carol Ada, MD;  Location: [facility] ENDOSCOPY;  Service: Endoscopy;  Laterality: N/A;  LVAD  . EYE SURGERY     VITRECTOMY  LEFT 05/2012  . FEMORAL-POPLITEAL BYPASS GRAFT Right 2011  .  FEMORAL-POPLITEAL BYPASS GRAFT Left 05/2002   Archie Endo 05/06/2002 (05/25/2013)  .  HEEL SPUR SURGERY Right 1990's  . HOLMIUM LASER APPLICATION Left 19/14/7829   Procedure: HOLMIUM LASER APPLICATION;  Surgeon: Bernestine Amass, MD;  Location: [facility];  Service: Urology;  Laterality: Left;  . IMPLANTABLE CARDIOVERTER DEFIBRILLATOR GENERATOR CHANGE N/A 05/25/2013   Procedure: IMPLANTABLE CARDIOVERTER DEFIBRILLATOR GENERATOR CHANGE;  Surgeon: Deboraha Sprang, MD;  Location: [facility] CATH LAB;  Service: Cardiovascular;  Laterality: N/A;  . INSERTION OF IMPLANTABLE LEFT VENTRICULAR ASSIST DEVICE N/A 10/19/2013   Procedure: INSERTION OF IMPLANTABLE LEFT VENTRICULAR ASSIST DEVICE;  Surgeon: Gaye Pollack, MD;  Location: [place];  Service: Open Heart Surgery;  Laterality: N/A;  CIRC ARREST  NITRIC OXIDE  MEDTRONIC ICD  . INTRAOPERATIVE TRANSESOPHAGEAL ECHOCARDIOGRAM N/A 10/19/2013   Procedure: INTRAOPERATIVE TRANSESOPHAGEAL ECHOCARDIOGRAM;  Surgeon: Gaye Pollack, MD;  [facility];  Service: Open Heart Surgery;  Laterality: N/A;  . KNEE ARTHROSCOPY Bilateral 1990's  . LEAD REVISION N/A 06/05/2013   Procedure: LEAD REVISION;  Surgeon: Thompson Grayer, MD;  Location: [facility] CATH LAB;  Service: Cardiovascular;  Laterality: N/A;  . LEFT HEART CATHETERIZATION WITH CORONARY ANGIOGRAM N/A 11/24/2012   Procedure: LEFT HEART CATHETERIZATION WITH CORONARY ANGIOGRAM;  Surgeon: Peter M Martinique, MD;  Location: [facility] CATH LAB;  Service: Cardiovascular;  Laterality: N/A;  . LIPOMA EXCISION Left 2013   "near ear" (05/25/2013)  . [place]; 2000's  . PATCH ANGIOPLASTY Right 01/13/2016   Procedure: WITH  PATCH ANGIOPLASTY;  Surgeon: Mal Misty, MD;  Location: [place];  Service: Vascular;  Laterality: Right;  . RENAL ARTERY BYPASS Bilateral 2003  . REVASCULARIZATION / IN-SITU GRAFT LEG    . RIGHT HEART CATHETERIZATION N/A 09/17/2013   Procedure: RIGHT HEART CATH;  Surgeon: Jolaine Artist, MD;  Location: [facility] CATH LAB;   Service: Cardiovascular;  Laterality: N/A;  . RIGHT HEART CATHETERIZATION N/A 10/14/2013   Procedure: RIGHT HEART CATH;  Surgeon: Jolaine Artist, MD;  Location: [facility] CATH LAB;  Service: Cardiovascular;  Laterality: N/A;  . RIGHT HEART CATHETERIZATION N/A 11/16/2013   Procedure: RIGHT HEART CATH;  Surgeon: Jolaine Artist, MD;  Location: [facility] CATH LAB;  Service: Cardiovascular;  Laterality: N/A;  . RIGHT HEART CATHETERIZATION N/A 07/13/2014   Procedure: RIGHT HEART CATH;  Surgeon: Jolaine Artist, MD;  Location: [facility] CATH LAB;  Service: Cardiovascular;  Laterality: N/A;  . TEE WITHOUT CARDIOVERSION N/A 11/04/2013   Procedure: TRANSESOPHAGEAL ECHOCARDIOGRAM (TEE);  Surgeon: Larey Dresser, MD;  Location: [place];  Service: Cardiovascular;  Laterality: N/A;  . TEE WITHOUT CARDIOVERSION N/A 03/02/2015   Procedure: TRANSESOPHAGEAL ECHOCARDIOGRAM (TEE);  Surgeon: Jolaine Artist, MD;  Location: [facility] ENDOSCOPY;  Service: Cardiovascular;  Laterality: N/A;  . TEE WITHOUT CARDIOVERSION N/A 01/19/2016   Procedure: TRANSESOPHAGEAL ECHOCARDIOGRAM (TEE);  Surgeon: Larey Dresser, MD;  Location: [place];  Service: Cardiovascular;  Laterality: N/A;  . TONSILLECTOMY  1946  . TUMOR EXCISION Left 1960's   "fatty tumor" (05/25/2013)  . V-TACH ABLATION N/A 09/12/2011   Procedure: V-TACH ABLATION;  Surgeon: Evans Lance, MD;  Location: [facility] CATH LAB;  Service: Cardiovascular;  Laterality: N/A;  . V-TACH ABLATION N/A 06/08/2013   Procedure: V-TACH ABLATION;  Surgeon: Evans Lance, MD;  Location: [facility] CATH LAB;  Service: Cardiovascular;  Laterality: N/A;    Family History: Family History  Problem Relation Age of Onset  . Heart attack Mother   . Coronary artery disease Mother   . Cancer Mother   . Heart disease Mother   . Hyperlipidemia  Mother   . Hypertension Mother   . Coronary artery disease Father   . Stroke Father   . Cancer Father   . Heart disease Father     before age 35  . Hyperlipidemia  Father   . Hypertension Father   . Heart attack Father   . Coronary artery disease      Social History: Social History   Social History  . Marital status: Divorced    Spouse name: N/A  . Number of children: 2  . Years of education: N/A   Occupational History  . retired     [employer]  . 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 Smoking status: Former Smoker    Packs/day: 0.50    Years: 37.00    Types: Cigarettes    Quit date: 11/05/2001  . Smokeless tobacco: Never Used  . Alcohol use 0.0 oz/week     Comment: 05/25/2013 "mixed drink couple times/month"  . Drug use: No  . Sexual activity: No   Other Topics Concern  . None   Social History Narrative   Lives with sig other.    Allergies:  Allergies  Allergen Reactions  . Demerol [Meperidine] Other (See Comments)    Paralysis. Could only move eyes. Paralysis. Could only move eyes.  [name] Opium Other (See Comments)    "CAN'T MOVE" CATATONIC PER PT.    Objective:    Vital Signs:   Temp:  [98.6 F (37 C)] 98.6 F (37 C) (04/18 2050) Pulse Rate:  [63-64] 64 (04/18 2137) Resp:  [14] 14 (04/18 2137) BP: (96)/(0) 96/0 (04/18 2137) SpO2:  [97 %-99 %] 99 % (04/18 2137) Weight:  [86.6 kg (191 lb)] 86.6 kg (191 lb) (04/18 2051)   Filed Weights   02/20/17 2051  Weight: 86.6 kg (191 lb)    Mean arterial Pressure 95  Physical Exam: General:  Well appearing. No resp difficulty HEENT: normal Neck: supple. JVP 8 . Carotids 2+ bilat; no bruits. No lymphadenopathy or thryomegaly appreciated. Cor: Mechanical heart sounds with LVAD hum present. Lungs: clear Abdomen: soft, nontender, nondistended. No hepatosplenomegaly. No bruits or masses. Good bowel sounds. Driveline: C/D/I; securement device intact and driveline incorporated Extremities: no cyanosis, clubbing, rash, edema Neuro: alert & orientedx3, cranial nerves grossly intact. moves all 4  extremities w/o difficulty. Affect pleasant  Telemetry: NSR 60s -Personally reviewed   Labs: Basic Metabolic Panel: No results for input(s): NA, K, CL, CO2, GLUCOSE, BUN, CREATININE, CALCIUM, MG, PHOS in the last 168 hours.  Liver Function Tests: No results for input(s): AST, ALT, ALKPHOS, BILITOT, PROT, ALBUMIN in the last 168 hours. No results for input(s): LIPASE, AMYLASE in the last 168 hours. No results for input(s): AMMONIA in the last 168 hours.  CBC:  Recent Labs Lab 02/20/17 2054  WBC 5.2  NEUTROABS 2.9  HGB 10.0*  HCT 30.2*  MCV 95.3  PLT 164    Cardiac Enzymes: No results for input(s): CKTOTAL, CKMB, CKMBINDEX, TROPONINI in the last 168 hours.  BNP: BNP (last 3 results)  Recent Labs  07/18/16 2253  BNP 1,289.7*    ProBNP (last 3 results) No results for input(s): PROBNP in the last 8760 hours.   CBG: No results for input(s): GLUCAP in the last 168 hours.  Coagulation Studies:  Recent Labs  02/18/17 02/20/17 2113  LABPROT  --  26.4*  INR 2.7 2.38  Other results: EKG: A pacing at 60 - Personally reviewed   Imaging: Dg Chest Port 1 View  Result Date: 02/20/2017 CLINICAL DATA:  Acute onset of dizziness and shortness of breath. Initial encounter. EXAM: PORTABLE CHEST 1 VIEW COMPARISON:  Chest radiograph performed 07/18/2016 FINDINGS: The lungs are well-aerated. A small left pleural effusion is suspected. There is no evidence of pneumothorax. The cardiomediastinal silhouette is mildly enlarged. The patient is status post median sternotomy. A left ventricular assist device is noted. A left chest wall pacemaker/AICD is seen, with leads ending at the right atrium and right ventricle. No acute osseous abnormalities are seen. IMPRESSION: Suspect small left pleural effusion.  Mild cardiomegaly. Electronically Signed   By: Garald Balding M.D.   On: 02/20/2017 21:40         Assessment:   1. Symptomatic anemia 2. Acute upper GI bleeding 3.  Chronic systolic HF s/p HM-II LVAD 4. h/o GI bleeding due to AVMs 5. PAF currently in NSR 6. h/o CVA on ASA and statin    Plan/Discussion:    He has symptomatic anemia due to recurrent GI bleeding. INR in range. Will hold coumadin and plavix. Give fluids and start IV protonix. Admit to 2W. Follow CBC. Transfuse as needed. Consult GI in am. Active type and screen in place.  Remains in NSR on amio. HF status stable.   VAD parameters checked personally.    I reviewed the LVAD parameters from today, and compared the results to the patient's prior recorded data.  No programming changes were made.  The LVAD is functioning within specified parameters.  The patient performs LVAD self-test daily.  LVAD interrogation was negative for any significant power changes, alarms or PI events/speed drops.  LVAD equipment check completed and is in good working order.  Back-up equipment present.   LVAD education done on emergency procedures and precautions and reviewed exit site care.  Length of Stay: 0  Itzael Liptak 02/20/2017, 10:10 PM  VAD Team Pager [phone] (7am - 7am) +++VAD ISSUES ONLY+++   Advanced Heart Failure Team Pager [phone] (M-F; [place])  Please contact [facility] for night-coverage after hours (4p -7a ) and weekends on [email or web address] for all non- LVAD Issues

## 2017-02-20 NOTE — ED Notes (Signed)
LVAD team and RResponse aware of patient

## 2017-02-20 NOTE — ED Provider Notes (Addendum)
[facility] DEPT Provider Note   CSN: [id number] Arrival date & time: 02/20/17  2038     History   Chief Complaint No chief complaint on file. Chief  Complaint;"I'm short of breath and lightheaded."  HPI [name] is a 76 y.o. male.  HPI complains of shortness of breath onset yesterday morning accompanied by lightheadedness. Denies pain anywhere. No fever no cough no abdominal pain last bowel movement yesterday normal. No treatment prior to coming here. Lightheadedness is worse with standing and dyspnea is worse with walking and improved with remaining still. No other associated symptoms  Past Medical History:  Diagnosis Date  . Arthritis   . Automatic implantable cardioverter-defibrillator in situ    a. s/p Medtronic Evera device serial [id number]    . Bradycardia   . CAD (coronary artery disease)    a. s/p MI 1982;  s/p DES to CFX 2011;  s/p NSTEMI 4/12 in setting of VTach;  cath 7/12:  LM ok, LAD mid 30-40%, Dx's with 40%, mCFX stent patent, prox to mid RCA occluded with R to R and L to R collats.  Medical Tx was continued  . Chronic systolic heart failure ([place])    a. s/p LVAD 10/2013 for destination therapy  . CKD (chronic kidney disease)   . CVD (cerebrovascular disease)    a. s/p L CEA 2008  . Cytopenia   . Dysrhythmia    AFIB  . History of GI bleed    a. on ASA- started on plavix during 10/2015 admission for CVA  . HLD (hyperlipidemia)   . HTN (hypertension)   . Hypothyroidism   . Inappropriate therapy from implantable cardioverter-defibrillator    a. ATP for sinus tach SVT wavelet identified SVT but was no  passive (2014)  . Ischemic cardiomyopathy   . 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 L Ear   . Myocardial infarction ([place])    1992  . PAF (paroxysmal atrial fibrillation) ([place])   . PVD (peripheral vascular disease) ([place])    a. h/o claudication;  s/p R fem to pop BPG 3/11;  s/p L CFA BPG 7/03;  s/p repair of inf AAA 6/03;  s/p aorta to bilat renal BPG 6/03    . Shortness of breath dyspnea    W/ EXERTION   . Sleep apnea    NO CPAP NOW  HE SENT IT BACK YRS AGO  . Stroke [facility])    a. 10/2015 admission   . Ventricular tachycardia ([place])    a. s/p AICD;   h/o ICD shock;  Amiodarone Rx. S/P ablation in 2012    Patient Active Problem List   Diagnosis Date Noted  . Acute bronchitis 07/19/2016  . Open wound of lumbar region without complication 54/62/7035  . Chronic diastolic heart failure ([place])   . Spinal compression fracture ([place]) 02/03/2016  . Subtherapeutic international normalized ratio (INR) 02/01/2016  . Carotid stenosis, asymptomatic 01/31/2016  . Carotid stenosis, symptomatic w/o infarct 01/31/2016  . Carotid artery calcification 01/11/2016  . Symptomatic stenosis of right carotid artery 01/09/2016  . History of GI bleed   . PAF (paroxysmal atrial fibrillation) ([place])   . CKD (chronic kidney disease)   . Hypothyroidism   . CVA (cerebral infarction)   . Cardiomyopathy, ischemic   . Carotid stenosis   . History of CEA (carotid endarterectomy)   . Chronic anticoagulation   . Left ventricular assist device (LVAD) complication 00/93/8182  . Jerking movements of extremities 07/14/2015  . Fall 06/14/2015  .  Cough 06/01/2015  . Nausea & vomiting 02/28/2015  . Near syncope   . Chronic systolic CHF (congestive heart failure) ([place])   . Atrial tachycardia, paroxysmal ([place])   . Back pain at L4-L5 level 02/20/2015  . Compression fracture of L4 lumbar vertebra (HCC) 02/20/2015  . GI bleed 02/12/2015  . Dyspnea 09/13/2014  . Nephrolithiasis 08/09/2014  . 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 CKD (chronic kidney disease) stage 3, GFR 30-59 ml/min 03/26/2014  . RVF (right ventricular failure) ([place]) 11/01/2013  . VT (ventricular tachycardia) ([place]) 10/22/2013  . LVAD (left ventricular assist device) present ([place]) 10/20/2013  . Acute on chronic systolic heart failure ([place]) 10/19/2013  . Atrial flutter ([place]) 10/15/2013  . Syncope 10/12/2013   . Weakness generalized 09/23/2013  . Other malaise and fatigue 09/23/2013  . Palliative care encounter 09/23/2013  . Fever 09/20/2013  . Sepsis(995.91) 09/20/2013  . Atrial tachycardia ([place]) 08/27/2013  . Chronic systolic heart failure ([place]) 08/27/2013  . Sinus bradycardia 05/11/2013  . Inappropriate therapy from implantable cardioverter-defibrillator 04/02/2013  . History of AAA (abdominal aortic aneurysm) repair 11/25/2012  . CAD (coronary artery disease) 04/30/2012  . Occlusion and stenosis of carotid artery without mention of cerebral infarction 03/25/2012  . PVD (peripheral vascular disease) ([place])   . HTN (hypertension)   . HLD (hyperlipidemia)   . Ischemic cardiomyopathy   . Automatic implantable cardioverter-defibrillator in situ 04/09/2011  . VENTRICULAR TACHYCARDIA 09/07/2009  . RENAL INSUFFICIENCY 09/07/2009    Past Surgical History:  Procedure Laterality Date  . BACK SURGERY    . CARDIAC CATHETERIZATION     "several" (05/25/2013)  . CARDIAC DEFIBRILLATOR PLACEMENT  2003; 2005; 05/25/2013   2014: Medtronic Evera device serial number [id number]  . CARDIAC ELECTROPHYSIOLOGY STUDY AND ABLATION  2012  . CARDIOVERSION N/A 10/15/2013   Procedure: CARDIOVERSION;  Surgeon: Jolaine Artist, MD;  Location: [place];  Service: Cardiovascular;  Laterality: N/A;  . CARDIOVERSION N/A 11/04/2013   Procedure: CARDIOVERSION;  Surgeon: Larey Dresser, MD;  Location: [place];  Service: Cardiovascular;  Laterality: N/A;  . CARDIOVERSION N/A 06/04/2014   Procedure: CARDIOVERSION;  Surgeon: Jolaine Artist, MD;  Location: [facility] ENDOSCOPY;  Service: Cardiovascular;  Laterality: N/A;  . CARDIOVERSION N/A 03/02/2015   Procedure: CARDIOVERSION;  Surgeon: Jolaine Artist, MD;  Location: [facility] ENDOSCOPY;  Service: Cardiovascular;  Laterality: N/A;  . CARDIOVERSION N/A 01/19/2016   Procedure: CARDIOVERSION;  Surgeon: Larey Dresser, MD;  Location: [place];  Service: Cardiovascular;   Laterality: N/A;  . CAROTID ENDARTERECTOMY Left ~ 2007  . CATARACT EXTRACTION W/ INTRAOCULAR LENS  IMPLANT, BILATERAL Bilateral 2000  . CHOLECYSTECTOMY  12/15/?2010  . CORONARY ANGIOPLASTY  1992  . CORONARY ANGIOPLASTY WITH STENT PLACEMENT     "last one was 11/2012  (05/25/2013)  . CYSTOSCOPY WITH RETROGRADE PYELOGRAM, URETEROSCOPY AND STENT PLACEMENT Left 08/09/2014   Procedure: CYSTOSCOPY WITH RETROGRADE PYELOGRAM, URETEROSCOPY AND STENT PLACEMENT;  Surgeon: Bernestine Amass, MD;  Location: [facility];  Service: Urology;  Laterality: Left;  . CYSTOSCOPY WITH RETROGRADE PYELOGRAM, URETEROSCOPY AND STENT PLACEMENT Left 09/01/2014   Procedure: CYSTOSCOPY WITH URETEROSCOPY AND STENT REMOVAL;  Surgeon: Bernestine Amass, MD;  Location: [facility];  Service: Urology;  Laterality: Left;  . CYSTOSCOPY WITH RETROGRADE PYELOGRAM, URETEROSCOPY AND STENT PLACEMENT Left 09/20/2014   Procedure: CYSTOSCOPY WITH RETROGRADE PYELOGRAM, URETEROSCOPY AND STENT PLACEMENT, stone removal;  Surgeon: Bernestine Amass, MD;  Location: [facility];  Service: Urology;  Laterality: Left;  . DOPPLER  ECHOCARDIOGRAPHY  2011  . ELBOW ARTHROSCOPY Right 1990's  . ELECTROPHYSIOLOGIC STUDY N/A 07/19/2016   Procedure: Cardioversion;  Surgeon: Evans Lance, MD;  Location: [place] CV LAB;  Service: Cardiovascular;  Laterality: N/A;  . ENDARTERECTOMY Right 01/13/2016   Procedure: RIGHT CAROTID ARTERY ENDARTERECTOMY;  Surgeon: Mal Misty, MD;  Location: [place];  Service: Vascular;  Laterality: Right;  . ESOPHAGOGASTRODUODENOSCOPY N/A 02/15/2015   Procedure: ESOPHAGOGASTRODUODENOSCOPY (EGD);  Surgeon: Carol Ada, MD;  Location: [facility] ENDOSCOPY;  Service: Endoscopy;  Laterality: N/A;  LVAD  . EYE SURGERY     VITRECTOMY  LEFT 05/2012  . FEMORAL-POPLITEAL BYPASS GRAFT Right 2011  . FEMORAL-POPLITEAL BYPASS GRAFT Left 05/2002   Archie Endo 05/06/2002 (05/25/2013)  . HEEL SPUR SURGERY Right 1990's  . HOLMIUM LASER APPLICATION Left 31/49/7026   Procedure: HOLMIUM  LASER APPLICATION;  Surgeon: Bernestine Amass, MD;  Location[facility];  Service: Urology;  Laterality: Left;  . IMPLANTABLE CARDIOVERTER DEFIBRILLATOR GENERATOR CHANGE N/A 05/25/2013   Procedure: IMPLANTABLE CARDIOVERTER DEFIBRILLATOR GENERATOR CHANGE;  Surgeon: Deboraha Sprang, MD;  Location: [facility] CATH LAB;  Service: Cardiovascular;  Laterality: N/A;  . INSERTION OF IMPLANTABLE LEFT VENTRICULAR ASSIST DEVICE N/A 10/19/2013   Procedure: INSERTION OF IMPLANTABLE LEFT VENTRICULAR ASSIST DEVICE;  Surgeon: Gaye Pollack, MD;  Location: [place];  Service: Open Heart Surgery;  Laterality: N/A;  CIRC ARREST  NITRIC OXIDE  MEDTRONIC ICD  . INTRAOPERATIVE TRANSESOPHAGEAL ECHOCARDIOGRAM N/A 10/19/2013   Procedure: INTRAOPERATIVE TRANSESOPHAGEAL ECHOCARDIOGRAM;  Surgeon: Gaye Pollack, MD;  Location: [facility] OR;  Service: Open Heart Surgery;  Laterality: N/A;  . KNEE ARTHROSCOPY Bilateral 1990's  . LEAD REVISION N/A 06/05/2013   Procedure: LEAD REVISION;  Surgeon: Thompson Grayer, MD;  Location: [facility] CATH LAB;  Service: Cardiovascular;  Laterality: N/A;  . LEFT HEART CATHETERIZATION WITH CORONARY ANGIOGRAM N/A 11/24/2012   Procedure: LEFT HEART CATHETERIZATION WITH CORONARY ANGIOGRAM;  Surgeon: Peter M Martinique, MD;  Location: [facility] CATH LAB;  Service: Cardiovascular;  Laterality: N/A;  . LIPOMA EXCISION Left 2013   "near ear" (05/25/2013)  . [place]; 2000's  . PATCH ANGIOPLASTY Right 01/13/2016   Procedure: WITH  PATCH ANGIOPLASTY;  Surgeon: Mal Misty, MD;  Location: [place];  Service: Vascular;  Laterality: Right;  . RENAL ARTERY BYPASS Bilateral 2003  . REVASCULARIZATION / IN-SITU GRAFT LEG    . RIGHT HEART CATHETERIZATION N/A 09/17/2013   Procedure: RIGHT HEART CATH;  Surgeon: Jolaine Artist, MD;  Location: [facility] CATH LAB;  Service: Cardiovascular;  Laterality: N/A;  . RIGHT HEART CATHETERIZATION N/A 10/14/2013   Procedure: RIGHT HEART CATH;  Surgeon: Jolaine Artist, MD;  Location: [facility] CATH LAB;   Service: Cardiovascular;  Laterality: N/A;  . RIGHT HEART CATHETERIZATION N/A 11/16/2013   Procedure: RIGHT HEART CATH;  Surgeon: Jolaine Artist, MD;  Location: [facility] CATH LAB;  Service: Cardiovascular;  Laterality: N/A;  . RIGHT HEART CATHETERIZATION N/A 07/13/2014   Procedure: RIGHT HEART CATH;  Surgeon: Jolaine Artist, MD;  Location: [facility] CATH LAB;  Service: Cardiovascular;  Laterality: N/A;  . TEE WITHOUT CARDIOVERSION N/A 11/04/2013   Procedure: TRANSESOPHAGEAL ECHOCARDIOGRAM (TEE);  Surgeon: Larey Dresser, MD;  Location: [place];  Service: Cardiovascular;  Laterality: N/A;  . TEE WITHOUT CARDIOVERSION N/A 03/02/2015   Procedure: TRANSESOPHAGEAL ECHOCARDIOGRAM (TEE);  Surgeon: Jolaine Artist, MD;  Location: [facility] ENDOSCOPY;  Service: Cardiovascular;  Laterality: N/A;  . TEE WITHOUT CARDIOVERSION N/A 01/19/2016   Procedure: TRANSESOPHAGEAL ECHOCARDIOGRAM (TEE);  Surgeon:  Larey Dresser, MD;  Location: [place];  Service: Cardiovascular;  Laterality: N/A;  . TONSILLECTOMY  1946  . TUMOR EXCISION Left 1960's   "fatty tumor" (05/25/2013)  . V-TACH ABLATION N/A 09/12/2011   Procedure: V-TACH ABLATION;  Surgeon: Evans Lance, MD;  Location: [facility] CATH LAB;  Service: Cardiovascular;  Laterality: N/A;  . V-TACH ABLATION N/A 06/08/2013   Procedure: V-TACH ABLATION;  Surgeon: Evans Lance, MD;  Location: [facility] CATH LAB;  Service: Cardiovascular;  Laterality: N/A;       Home Medications    Prior to Admission medications   Medication Sig Start Date End Date Taking? Authorizing Provider  amiodarone (PACERONE) 200 MG tablet Take 200 mg by mouth daily.    Historical Provider, MD  atorvastatin (LIPITOR) 80 MG tablet Take 1 tablet (80 mg total) by mouth daily at 6 PM. 12/10/16   Jolaine Artist, MD  benzonatate (TESSALON) 100 MG capsule Take 1 capsule (100 mg total) by mouth 3 (three) times daily as needed for cough. 07/30/16   Jolaine Artist, MD  clopidogrel (PLAVIX) 75 MG tablet Take 1  tablet (75 mg total) by mouth daily. 08/27/16   Jolaine Artist, MD  docusate sodium (COLACE) 100 MG capsule Take 100 mg by mouth daily as needed for mild constipation.     Historical Provider, MD  enoxaparin (LOVENOX) 60 MG/0.6ML injection Inject 0.5 mLs (50 mg total) into the skin every 12 (twelve) hours. 02/07/17   Jolaine Artist, MD  furosemide (LASIX) 20 MG tablet Take 1 tablet (20 mg total) by mouth as needed for edema (weight gain). Patient not taking: Reported on 01/21/2017 11/14/15   Jolaine Artist, MD  levothyroxine (SYNTHROID, LEVOTHROID) 200 MCG tablet TAKE 1 TABLET DAILY BEFORE BREAKFAST WITH 1 & 1/2 TABLETS EVERY MONDAY. 02/04/17   Jolaine Artist, MD  metoprolol succinate (TOPROL-XL) 25 MG 24 hr tablet Take 1 tablet (25 mg total) by mouth 2 (two) times daily. 05/14/16   Larey Dresser, MD  Multiple Vitamins-Minerals (ICAPS PO) Take 1 tablet by mouth 2 (two) times daily.    Historical Provider, MD  nitroGLYCERIN (NITROSTAT) 0.4 MG SL tablet Place 0.4 mg under the tongue every 5 (five) minutes as needed for chest pain.    Historical Provider, MD  pantoprazole (PROTONIX) 40 MG tablet TAKE 1 TABLET TWICE DAILY 12/05/16   Jolaine Artist, MD  ranolazine (RANEXA) 500 MG 12 hr tablet TAKE 1 TABLET (500 MG TOTAL) BY MOUTH 2 (TWO) TIMES DAILY. 10/26/16   Jolaine Artist, MD  warfarin (COUMADIN) 2 MG tablet TAKE AS DIRECTED 12/05/16   Jolaine Artist, MD  zinc gluconate 50 MG tablet Take 50 mg by mouth 2 (two) times daily.     Historical Provider, MD    Family History Family History  Problem Relation Age of Onset  . Heart attack Mother   . Coronary artery disease Mother   . Cancer Mother   . Heart disease Mother   . Hyperlipidemia Mother   . Hypertension Mother   . Coronary artery disease Father   . Stroke Father   . Cancer Father   . Heart disease Father     before age 50  . Hyperlipidemia Father   . Hypertension Father   . Heart attack Father   . Coronary artery  disease      Social History Social History  Substance Use Topics  . Smoking status: Former Smoker    Packs/day: 0.50  Years: 37.00    Types: Cigarettes    Quit date: 11/05/2001  . Smokeless tobacco: Never Used  . Alcohol use 0.0 oz/week     Comment: 05/25/2013 "mixed drink couple times/month"     Allergies   Demerol [meperidine] and Opium   Review of Systems Review of Systems  Constitutional: Negative.   HENT: Negative.   Respiratory: Positive for shortness of breath.   Cardiovascular: Negative.   Gastrointestinal: Negative.   Musculoskeletal: Negative.   Skin: Negative.   Neurological: Positive for light-headedness.  Psychiatric/Behavioral: Negative.   All other systems reviewed and are negative.    Physical Exam Updated Vital Signs BP (!) 96/0 (BP Location: Right Arm)   Pulse 64   Temp 98.6 F (37 C) (Oral)   Resp 14   Ht 6' (1.829 m)   Wt 191 lb (86.6 kg)   SpO2 99%   BMI 25.90 kg/m   Physical Exam  Constitutional: He appears well-developed and well-nourished.  HENT:  Head: Normocephalic and atraumatic.  Eyes: Conjunctivae are normal. Pupils are equal, round, and reactive to light.  Conjunctiva are pale  Neck: Neck supple. No tracheal deviation present. No thyromegaly present.  Cardiovascular:  No murmur heard. Heart sounds absent  Pulmonary/Chest: Effort normal and breath sounds normal.  Abdominal: Soft. Bowel sounds are normal. He exhibits no distension. There is no tenderness.  Genitourinary: Rectal exam shows guaiac positive stool.  Genitourinary Comments: Rectum normal tone maroon melanotic stool  Musculoskeletal: Normal range of motion. He exhibits no edema or tenderness.  Neurological: He is alert. Coordination normal.  Skin: Skin is warm and dry. No rash noted.  Psychiatric: He has a normal mood and affect.  Nursing note and vitals reviewed.    ED Treatments / Results  Labs (all labs ordered are listed, but only abnormal results are  displayed) Labs Reviewed  POC OCCULT BLOOD, ED - Abnormal; Notable for the following:       Result Value   Fecal Occult Bld POSITIVE (*)    All other components within normal limits  CBC WITH DIFFERENTIAL/PLATELET  COMPREHENSIVE METABOLIC PANEL  TROPONIN I  PROTIME-INR  LACTATE DEHYDROGENASE  TYPE AND SCREEN    EKG  EKG Interpretation  Date/Time:  Wednesday February 20 2017 20:56:24 EDT Ventricular Rate:  60 PR Interval:    QRS Duration: 100 QT Interval:  496 QTC Calculation: 496 R Axis:   -125 Text Interpretation:  Atrial-paced rhythm with prolonged AV conduction Possible Lateral infarct , age undetermined Inferior infarct , age undetermined Abnormal ECG No significant change since last tracing Confirmed by Winfred Leeds  MD, Ewald Beg [phone]) on 02/20/2017 9:51:03 PM       Radiology Dg Chest Port 1 View  Result Date: 02/20/2017 CLINICAL DATA:  Acute onset of dizziness and shortness of breath. Initial encounter. EXAM: PORTABLE CHEST 1 VIEW COMPARISON:  Chest radiograph performed 07/18/2016 FINDINGS: The lungs are well-aerated. A small left pleural effusion is suspected. There is no evidence of pneumothorax. The cardiomediastinal silhouette is mildly enlarged. The patient is status post median sternotomy. A left ventricular assist device is noted. A left chest wall pacemaker/AICD is seen, with leads ending at the right atrium and right ventricle. No acute osseous abnormalities are seen. IMPRESSION: Suspect small left pleural effusion.  Mild cardiomegaly. Electronically Signed   By: Garald Balding M.D.   On: 02/20/2017 21:40    Procedures Procedures (including critical care time)  Medications Ordered in ED Medications - No data to display  Results for orders  placed or performed during the hospital encounter of 02/20/17  CBC with Differential  Result Value Ref Range   WBC 5.2 4.0 - 10.5 K/uL   RBC 3.17 (L) 4.22 - 5.81 MIL/uL   Hemoglobin 10.0 (L) 13.0 - 17.0 g/dL   HCT 30.2 (L) 39.0 -  52.0 %   MCV 95.3 78.0 - 100.0 fL   MCH 31.5 26.0 - 34.0 pg   MCHC 33.1 30.0 - 36.0 g/dL   RDW 16.5 (H) 11.5 - 15.5 %   Platelets 164 150 - 400 K/uL   Neutrophils Relative % 57 %   Neutro Abs 2.9 1.7 - 7.7 K/uL   Lymphocytes Relative 27 %   Lymphs Abs 1.4 0.7 - 4.0 K/uL   Monocytes Relative 11 %   Monocytes Absolute 0.6 0.1 - 1.0 K/uL   Eosinophils Relative 4 %   Eosinophils Absolute 0.2 0.0 - 0.7 K/uL   Basophils Relative 1 %   Basophils Absolute 0.0 0.0 - 0.1 K/uL  Comprehensive metabolic panel  Result Value Ref Range   Sodium 134 (L) 135 - 145 mmol/L   Potassium 4.2 3.5 - 5.1 mmol/L   Chloride 104 101 - 111 mmol/L   CO2 23 22 - 32 mmol/L   Glucose, Bld 121 (H) 65 - 99 mg/dL   BUN 48 (H) 6 - 20 mg/dL   Creatinine, Ser 1.50 (H) 0.61 - 1.24 mg/dL   Calcium 8.9 8.9 - 10.3 mg/dL   Total Protein 6.8 6.5 - 8.1 g/dL   Albumin 3.9 3.5 - 5.0 g/dL   AST 30 15 - 41 U/L   ALT 23 17 - 63 U/L   Alkaline Phosphatase 109 38 - 126 U/L   Total Bilirubin 0.7 0.3 - 1.2 mg/dL   GFR calc non Af [name] 43 (L) >60 mL/min   GFR calc Af [name] 50 (L) >60 mL/min   Anion gap 7 5 - 15  Troponin I  Result Value Ref Range   Troponin I <0.03 <0.03 ng/mL  Protime-INR  Result Value Ref Range   Prothrombin Time 26.4 (H) 11.4 - 15.2 seconds   INR 2.38   Lactate dehydrogenase  Result Value Ref Range   LDH 288 (H) 98 - 192 U/L  POC occult blood, ED  Result Value Ref Range   Fecal Occult Bld POSITIVE (A) NEGATIVE  Type and screen  Result Value Ref Range   ABO/RH(D) O POS    Antibody Screen NEG    Sample Expiration 02/23/2017    *Note: Due to a large number of results and/or encounters for the requested time period, some results have not been displayed. A complete set of results can be found in Results Review.   Dg Chest Port 1 View  Result Date: 02/20/2017 CLINICAL DATA:  Acute onset of dizziness and shortness of breath. Initial encounter. EXAM: PORTABLE CHEST 1 VIEW COMPARISON:  Chest radiograph  performed 07/18/2016 FINDINGS: The lungs are well-aerated. A small left pleural effusion is suspected. There is no evidence of pneumothorax. The cardiomediastinal silhouette is mildly enlarged. The patient is status post median sternotomy. A left ventricular assist device is noted. A left chest wall pacemaker/AICD is seen, with leads ending at the right atrium and right ventricle. No acute osseous abnormalities are seen. IMPRESSION: Suspect small left pleural effusion.  Mild cardiomegaly. Electronically Signed   By: Garald Balding M.D.   On: 02/20/2017 21:40   Initial Impression / Assessment and Plan / ED Course  I have reviewed the triage  vital signs and the nursing notes.  Pertinent labs & imaging results that were available during my care of the patient were reviewed by me and considered in my medical decision making (see chart for details).     10 PM Normal Saline bolus 1 L IV ordered as patient hypotensive Dr. Sung Amabile consulted and will evaluate patient in ED.  Dr. Sung Amabile arranged for admission. Of note patient's hemoglobin has dropped 2.7 g in 1 month. Final Clinical Impressions(s) / ED Diagnoses  Diagnosis #1acute GI bleeding #2 hypotension #3 symptomatic anemia #4 renal insufficiency CRITICAL CARE Performed by: Orlie Dakin Total critical care time: 30 minutes Critical care time was exclusive of separately billable procedures and treating other patients. Critical care was necessary to treat or prevent imminent or life-threatening deterioration. Critical care was time spent personally by me on the following activities: development of treatment plan with patient and/or surrogate as well as nursing, discussions with consultants, evaluation of patient's response to treatment, examination of patient, obtaining history from patient or surrogate, ordering and performing treatments and interventions, ordering and review of laboratory studies, ordering and review of radiographic studies,  pulse oximetry and re-evaluation of patient's condition. Final diagnoses:  None    New Prescriptions New Prescriptions   No medications on file     Orlie Dakin, MD 02/20/17 [place], MD 02/21/17 [id number]

## 2017-02-20 NOTE — ED Triage Notes (Signed)
Pt to ED c/o shortness of breath and feeling dizzy.  Pt st's onset last pm.  Pt st's symptoms subside when he sits down.  Pt is a LVAD pt

## 2017-02-21 LAB — BASIC METABOLIC PANEL
Anion gap: 8 (ref 5–15)
BUN: 44 mg/dL — ABNORMAL HIGH (ref 6–20)
CO2: 21 mmol/L — ABNORMAL LOW (ref 22–32)
CREATININE: 1.38 mg/dL — AB (ref 0.61–1.24)
Calcium: 8.4 mg/dL — ABNORMAL LOW (ref 8.9–10.3)
Chloride: 108 mmol/L (ref 101–111)
GFR calc non Af Amer: 48 mL/min — ABNORMAL LOW (ref >60)
GFR, EST AFRICAN AMERICAN: 55 mL/min — AB (ref >60)
Glucose, Bld: 107 mg/dL — ABNORMAL HIGH (ref 65–99)
Potassium: 3.8 mmol/L (ref 3.5–5.1)
SODIUM: 137 mmol/L (ref 135–145)

## 2017-02-21 LAB — CBC
HCT: 26.4 % — ABNORMAL LOW (ref 39.0–52.0)
Hemoglobin: 8.7 g/dL — ABNORMAL LOW (ref 13.0–17.0)
MCH: 31 pg (ref 26.0–34.0)
MCHC: 33 g/dL (ref 30.0–36.0)
MCV: 94 fL (ref 78.0–100.0)
PLATELETS: 129 10*3/uL — AB (ref 150–400)
RBC: 2.81 MIL/uL — ABNORMAL LOW (ref 4.22–5.81)
RDW: 16.3 % — ABNORMAL HIGH (ref 11.5–15.5)
WBC: 4.7 10*3/uL (ref 4.0–10.5)

## 2017-02-21 LAB — PROTIME-INR
INR: 2.53
PROTHROMBIN TIME: 27.7 s — AB (ref 11.4–15.2)

## 2017-02-21 LAB — LACTATE DEHYDROGENASE: LDH: 229 U/L — AB (ref 98–192)

## 2017-02-21 LAB — PREPARE RBC (CROSSMATCH)

## 2017-02-21 MED ORDER — SODIUM CHLORIDE 0.9 % IV SOLN
Freq: Once | INTRAVENOUS | Status: AC
  Administered 2017-02-21: 09:00:00 via INTRAVENOUS

## 2017-02-21 NOTE — Progress Notes (Signed)
Admitted 02/20/17 due to GI bleed.  HeartMate II LVAD implated on 12/01/12 by Dr. Cyndia Bent as DT VAD.  Vital signs: HR: 65 Doppler Pressure: 84 Automatic BP: 83/62 (64) O2 Sat: 98% on RA Wt: 89.8 kg/ 197 lbs   LVAD interrogation reveals:  Speed: 8800 Flow: ---; 3.4 Power: 4.3 PI: 7.6 Alarms: 7 low flows on 02/19/17 (pt reports he had no audible alarms, they were too brief) Events:  02/20/17 - 76 PI events; 02/19/17 - 34 PI events; 02/18/17 - 47 PI events Fixed speed: 8800 Low speed limit: 8200  Drive Line: unremarkable; weekly dressing changes  Labs:  LDH trend: 288>229  INR trend: 2.38>2.53  Blood products:  02/21/17 - 2 units PCs  Anticoagulation Plan: -INR Goal: 2.0 - 2.5 - coumadin on hold -ASA - off due to hx of GI bleed -Plavix 75 mg - on hold  Adverse Events on VAD: -GI bleed this admission  Plan/Recommendations:   1. Nursing to perform weekly dressing changes. 2. GI consult with possible endoscopy when INR within range (possibly Saturday).

## 2017-02-21 NOTE — Consult Note (Signed)
[facility] Consultation Note  Referring Provider: Dr. Haroldine Laws Primary Care Physician:  Glori Bickers, MD  Reason for Consultation:  Anemia, hemoccult-positive stool  HPI: [name] is a 76 y.o. male admitted for progressive shortness of breath and weakness.  Has known CAD/CHF with LVAD for several years.  On warfarin and clopidigrel.  Had few-gram drop in Hgb with hemoccult-positive stool.  No overt blood in stool.  No nausea, vomiting, hematemesis, abdominal pain, change in bowel habits, loss-of-appetite, unintentional weight loss. Had enteroscopy couple years ago for melena with Dr. 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   Past Medical History:  Diagnosis Date  . Arthritis   . Automatic implantable cardioverter-defibrillator in situ    a. s/p Medtronic Evera device serial [id number]    . Bradycardia   . CAD (coronary artery disease)    a. s/p MI 1982;  s/p DES to CFX 2011;  s/p NSTEMI 4/12 in setting of VTach;  cath 7/12:  LM ok, LAD mid 30-40%, Dx's with 40%, mCFX stent patent, prox to mid RCA occluded with R to R and L to R collats.  Medical Tx was continued  . Chronic systolic heart failure ([place])    a. s/p LVAD 10/2013 for destination therapy  . CKD (chronic kidney disease)   . CVD (cerebrovascular disease)    a. s/p L CEA 2008  . Cytopenia   . Dysrhythmia    AFIB  . History of GI bleed    a. on ASA- started on plavix during 10/2015 admission for CVA  . HLD (hyperlipidemia)   . HTN (hypertension)   . Hypothyroidism   . Inappropriate therapy from implantable cardioverter-defibrillator    a. ATP for sinus tach SVT wavelet identified SVT but was no  passive (2014)  . Ischemic cardiomyopathy   . Melanoma of ear ([name])    a. L Ear   . Myocardial infarction ([name])    1992  . PAF (paroxysmal atrial fibrillation) ([name])   . PVD (peripheral vascular disease) ([place])    a. 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 Shortness of breath dyspnea    W/ EXERTION   . Sleep apnea    NO CPAP NOW  HE SENT IT BACK YRS AGO  . Stroke [name])    a. 10/2015 admission   . Ventricular tachycardia ([name])    a. s/p AICD;   h/o ICD shock;  Amiodarone Rx. S/P ablation in 2012    Past Surgical History:  Procedure Laterality Date  . BACK SURGERY    . CARDIAC CATHETERIZATION     "several" (05/25/2013)  . CARDIAC DEFIBRILLATOR PLACEMENT  2003; 2005; 05/25/2013   2014: Medtronic Evera device serial number [id number]  . CARDIAC ELECTROPHYSIOLOGY STUDY AND ABLATION  2012  . CARDIOVERSION N/A 10/15/2013   Procedure: CARDIOVERSION;  Surgeon: Jolaine Artist, MD;  Location: [place];  Service: Cardiovascular;  Laterality: N/A;  . CARDIOVERSION N/A 11/04/2013   Procedure: CARDIOVERSION;  Surgeon: Larey Dresser, MD;  Location: [place];  Service: Cardiovascular;  Laterality: N/A;  . CARDIOVERSION N/A 06/04/2014   Procedure: CARDIOVERSION;  Surgeon: Jolaine Artist, MD;  Location: [facility] ENDOSCOPY;  Service: Cardiovascular;  Laterality: N/A;  . CARDIOVERSION N/A 03/02/2015   Procedure: CARDIOVERSION;  Surgeon: Jolaine Artist, MD;  Location: [name]OSCOPY;  Service: Cardiovascular;  Laterality: N/A;  .  CARDIOVERSION N/A 01/19/2016   Procedure: CARDIOVERSION;  Surgeon: Larey Dresser, MD;  Location: [place];  Service: Cardiovascular;  Laterality: N/A;  . CAROTID ENDARTERECTOMY Left ~ 2007  . CATARACT EXTRACTION W/ INTRAOCULAR LENS  IMPLANT, BILATERAL Bilateral 2000  . CHOLECYSTECTOMY  12/15/?2010  . CORONARY ANGIOPLASTY  1992  . CORONARY ANGIOPLASTY WITH STENT PLACEMENT     "last one was 11/2012  (05/25/2013)  . CYSTOSCOPY WITH RETROGRADE PYELOGRAM, URETEROSCOPY AND STENT PLACEMENT Left 08/09/2014   Procedure: CYSTOSCOPY WITH RETROGRADE PYELOGRAM, URETEROSCOPY AND STENT PLACEMENT;  Surgeon: Bernestine Amass, MD;  Location: [facility];  Service: Urology;  Laterality: Left;  .  CYSTOSCOPY WITH RETROGRADE PYELOGRAM, URETEROSCOPY AND STENT PLACEMENT Left 09/01/2014   Procedure: CYSTOSCOPY WITH URETEROSCOPY AND STENT REMOVAL;  Surgeon: Bernestine Amass, MD;  Location: [facility];  Service: Urology;  Laterality: Left;  . CYSTOSCOPY WITH RETROGRADE PYELOGRAM, URETEROSCOPY AND STENT PLACEMENT Left 09/20/2014   Procedure: CYSTOSCOPY WITH RETROGRADE PYELOGRAM, URETEROSCOPY AND STENT PLACEMENT, stone removal;  Surgeon: Bernestine Amass, MD;  Location: [facility];  Service: Urology;  Laterality: Left;  . DOPPLER ECHOCARDIOGRAPHY  2011  . ELBOW ARTHROSCOPY Right 1990's  . ELECTROPHYSIOLOGIC STUDY N/A 07/19/2016   Procedure: Cardioversion;  Surgeon: Evans Lance, MD;  Location: [place] CV LAB;  Service: Cardiovascular;  Laterality: N/A;  . ENDARTERECTOMY Right 01/13/2016   Procedure: RIGHT CAROTID ARTERY ENDARTERECTOMY;  Surgeon: Mal Misty, MD;  Location: [place];  Service: Vascular;  Laterality: Right;  . ESOPHAGOGASTRODUODENOSCOPY N/A 02/15/2015   Procedure: ESOPHAGOGASTRODUODENOSCOPY (EGD);  Surgeon: Carol Ada, MD;  Location: [facility] ENDOSCOPY;  Service: Endoscopy;  Laterality: N/A;  LVAD  . EYE SURGERY     VITRECTOMY  LEFT 05/2012  . FEMORAL-POPLITEAL BYPASS GRAFT Right 2011  . FEMORAL-POPLITEAL BYPASS GRAFT Left 05/2002   Archie Endo 05/06/2002 (05/25/2013)  . HEEL SPUR SURGERY Right 1990's  . HOLMIUM LASER APPLICATION Left 32/20/2542   Procedure: HOLMIUM LASER APPLICATION;  Surgeon: Bernestine Amass, MD;  Location: [facility];  Service: Urology;  Laterality: Left;  . IMPLANTABLE CARDIOVERTER DEFIBRILLATOR GENERATOR CHANGE N/A 05/25/2013   Procedure: IMPLANTABLE CARDIOVERTER DEFIBRILLATOR GENERATOR CHANGE;  Surgeon: Deboraha Sprang, MD;  Location: [facility] CATH LAB;  Service: Cardiovascular;  Laterality: N/A;  . INSERTION OF IMPLANTABLE LEFT VENTRICULAR ASSIST DEVICE N/A 10/19/2013   Procedure: INSERTION OF IMPLANTABLE LEFT VENTRICULAR ASSIST DEVICE;  Surgeon: Gaye Pollack, MD;  Location: [place];   Service: Open Heart Surgery;  Laterality: N/A;  CIRC ARREST  NITRIC OXIDE  MEDTRONIC ICD  . INTRAOPERATIVE TRANSESOPHAGEAL ECHOCARDIOGRAM N/A 10/19/2013   Procedure: INTRAOPERATIVE TRANSESOPHAGEAL ECHOCARDIOGRAM;  Surgeon: Gaye Pollack, MD;  Location: [facility];  Service: Open Heart Surgery;  Laterality: N/A;  . KNEE ARTHROSCOPY Bilateral 1990's  . LEAD REVISION N/A 06/05/2013   Procedure: LEAD REVISION;  Surgeon: Thompson Grayer, MD;  Location[name] C[facility] LAB;  Service: Cardiovascular;  Laterality: N/A;  . LEFT HEART CATHETERIZATION WITH CORONARY ANGIOGRAM N/A 11/24/2012   Procedure: LEFT HEART CATHETERIZATION WITH CORONARY ANGIOGRAM;  Surgeon: Peter M Martinique, MD;  Location: [facility] CATH LAB;  Service: Cardiovascular;  Laterality: N/A;  . LIPOMA EXCISION Left 2013   "near ear" (05/25/2013)  . [place]; 2000's  . PATCH ANGIOPLASTY Right 01/13/2016   Procedure: WITH  PATCH ANGIOPLASTY;  Surgeon: Mal Misty, MD;  Location: [place];  Service: Vascular;  Laterality: Right;  . RENAL ARTERY BYPASS Bilateral 2003  . REVASCULARIZATION / IN-SITU GRAFT LEG    . RIGHT  HEART CATHETERIZATION N/A 09/17/2013   Procedure: RIGHT HEART CATH;  Surgeon: Jolaine Artist, MD;  Location: [facility] CATH LAB;  Service: Cardiovascular;  Laterality: N/A;  . RIGHT HEART CATHETERIZATION N/A 10/14/2013   Procedure: RIGHT HEART CATH;  Surgeon: Jolaine Artist, MD;  Location: [facility] CATH LAB;  Service: Cardiovascular;  Laterality: N/A;  . RIGHT HEART CATHETERIZATION N/A 11/16/2013   Procedure: RIGHT HEART CATH;  Surgeon: Jolaine Artist, MD;  Location: [facility] CATH LAB;  Service: Cardiovascular;  Laterality: N/A;  . RIGHT HEART CATHETERIZATION N/A 07/13/2014   Procedure: RIGHT HEART CATH;  Surgeon: Jolaine Artist, MD;  Location: [facility] CATH LAB;  Service: Cardiovascular;  Laterality: N/A;  . TEE WITHOUT CARDIOVERSION N/A 11/04/2013   Procedure: TRANSESOPHAGEAL ECHOCARDIOGRAM (TEE);  Surgeon: Larey Dresser, MD;  Location: [place];  Service: Cardiovascular;  Laterality: N/A;  . TEE WITHOUT CARDIOVERSION N/A 03/02/2015   Procedure: TRANSESOPHAGEAL ECHOCARDIOGRAM (TEE);  Surgeon: Jolaine Artist, MD;  Location: [facility] ENDOSCOPY;  Service: Cardiovascular;  Laterality: N/A;  . TEE WITHOUT CARDIOVERSION N/A 01/19/2016   Procedure: TRANSESOPHAGEAL ECHOCARDIOGRAM (TEE);  Surgeon: Larey Dresser, MD;  Location: [place];  Service: Cardiovascular;  Laterality: N/A;  . TONSILLECTOMY  1946  . TUMOR EXCISION Left 1960's   "fatty tumor" (05/25/2013)  . V-TACH ABLATION N/A 09/12/2011   Procedure: V-TACH ABLATION;  Surgeon: Evans Lance, MD;  Location: [name] CATH LAB;  Service: Cardiovascular;  Laterality: N/A;  . V-TACH ABLATION N/A 06/08/2013   Procedure: V-TACH ABLATION;  Surgeon: Evans Lance, MD;  Location: [name] CATH LAB;  Service: Cardiovascular;  Laterality: N/A;    Prior to Admission medications   Medication Sig Start Date End Date Taking? Authorizing Provider  amiodarone (PACERONE) 200 MG tablet Take 200 mg by mouth daily.   Yes Historical Provider, MD  atorvastatin (LIPITOR) 80 MG tablet Take 1 tablet (80 mg total) by mouth daily at 6 PM. 12/10/16  Yes Jolaine Artist, MD  clopidogrel (PLAVIX) 75 MG tablet Take 1 tablet (75 mg total) by mouth daily. 08/27/16  Yes Jolaine Artist, MD  docusate sodium (COLACE) 100 MG capsule Take 100 mg by mouth daily as needed for mild constipation.    Yes Historical Provider, MD  furosemide (LASIX) 20 MG tablet Take 1 tablet (20 mg total) by mouth as needed for edema (weight gain). 11/14/15  Yes Shaune Pascal Bensimhon, MD  levothyroxine (SYNTHROID, LEVOTHROID) 200 MCG tablet TAKE 1 TABLET DAILY BEFORE BREAKFAST WITH 1 & 1/2 TABLETS EVERY MONDAY. 02/04/17  Yes Jolaine Artist, MD  metoprolol succinate (TOPROL-XL) 25 MG 24 hr tablet Take 1 tablet (25 mg total) by mouth 2 (two) times daily. 05/14/16  Yes Larey Dresser, MD  Multiple Vitamins-Minerals (ICAPS PO) Take 1 tablet by mouth 2  (two) times daily.   Yes Historical Provider, MD  nitroGLYCERIN (NITROSTAT) 0.4 MG SL tablet Place 0.4 mg under the tongue every 5 (five) minutes as needed for chest pain.   Yes Historical Provider, MD  pantoprazole (PROTONIX) 40 MG tablet TAKE 1 TABLET TWICE DAILY 12/05/16  Yes Jolaine Artist, MD  ranolazine (RANEXA) 500 MG 12 hr tablet TAKE 1 TABLET (500 MG TOTAL) BY MOUTH 2 (TWO) TIMES DAILY. 10/26/16  Yes Jolaine Artist, MD  zinc gluconate 50 MG tablet Take 50 mg by mouth 2 (two) times daily.    Yes Historical Provider, MD  warfarin (COUMADIN) 2 MG tablet TAKE AS DIRECTED Patient taking differently: Take 2 mg on Mon /  Wed / Fri . Take 4 mg on Tues / Thurs/ Sat / Sun 12/05/16   Jolaine Artist, MD    Current Facility-Administered Medications  Medication Dose Route Frequency Provider Last Rate Last Dose  . acetaminophen (TYLENOL) tablet 650 mg  650 mg Oral Q4H PRN Jolaine Artist, MD      . amiodarone (PACERONE) tablet 200 mg  200 mg Oral Daily Jolaine Artist, MD   200 mg at 02/21/17 0935  . atorvastatin (LIPITOR) tablet 80 mg  80 mg Oral q1800 Jolaine Artist, MD      . levothyroxine (SYNTHROID, LEVOTHROID) tablet 200 mcg  200 mcg Oral Once per day on Sun Tue Wed Thu Fri Sat Jolaine Artist, MD   Stopped at 02/21/17 0630  . [START ON 02/25/2017] levothyroxine (SYNTHROID, LEVOTHROID) tablet 300 mcg  300 mcg Oral Once per day on Mon Jolaine Artist, MD      . metoprolol succinate (TOPROL-XL) 24 hr tablet 25 mg  25 mg Oral BID Jolaine Artist, MD   25 mg at 02/21/17 0935  . ondansetron (ZOFRAN) injection 4 mg  4 mg Intravenous Q6H PRN Jolaine Artist, MD      . pantoprazole (PROTONIX) injection 40 mg  40 mg Intravenous Q24H Jolaine Artist, MD   40 mg at 02/20/17 2259  . ranolazine (RANEXA) 12 hr tablet 500 mg  500 mg Oral BID Jolaine Artist, MD   500 mg at 02/21/17 0935    Allergies as of 02/20/2017 - Review Complete 01/21/2017  Allergen Reaction Noted  .  Demerol [meperidine] Other (See Comments) 08/27/2011  . Opium Other (See Comments) 06/01/2013    Family History  Problem Relation Age of Onset  . Heart attack Mother   . Coronary artery disease Mother   . Cancer Mother   . Heart disease Mother   . Hyperlipidemia Mother   . Hypertension Mother   . Coronary artery disease Father   . Stroke Father   . Cancer Father   . Heart disease Father     before age 63  . Hyperlipidemia Father   . Hypertension Father   . Heart attack Father   . Coronary artery disease      Social History   Social History  . Marital status: Divorced    Spouse name: N/A  . Number of children: 2  . Years of education: N/A   Occupational History  . retired     [employer]  . 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 Smoking status: Former Smoker    Packs/day: 0.50    Years: 37.00    Types: Cigarettes    Quit date: 11/05/2001  . Smokeless tobacco: Never Used  . Alcohol use 0.0 oz/week     Comment: 05/25/2013 "mixed drink couple times/month"  . Drug use: No  . Sexual activity: No   Other Topics Concern  . Not on file   Social History Narrative   Lives with sig other.    Review of Systems: ROS Dr. Haroldine Laws reviewed 4/18 and I agree  Physical Exam: Vital signs in last 24 hours: Temp:  [97.9 F (36.6 C)-98.6 F (37 C)] 98.5 F (36.9 C) (04/19 1235) Pulse Rate:  [60-71] 67 (04/19 1235) Resp:  [12-18] 16 (04/19 1235) BP: (83-112)/(0-83) 101/83 (04/19 1235) SpO2:  [96 %-100 %] 96 % (04/19  1235) Weight:  [86.6 kg (191 lb)-92.4 kg (203 lb 12.8 oz)] 89.8 kg (197 lb 14.4 oz) (04/19 0654) Last BM Date: 02/19/17 General:   Alert,  Well-developed, well-nourished, pleasant and cooperative in NAD Head:  Normocephalic and atraumatic. Eyes:  Sclera clear, no icterus.   Conjunctiva pink. Ears:  Normal auditory acuity. Nose:  No deformity, discharge,  or lesions. Mouth:  No  deformity or lesions.  Oropharynx pink & moist. Neck:  Supple; no masses or thyromegaly. Lungs:  Clear throughout to auscultation.   No wheezes, crackles, or rhonchi. No acute distress. Heart:  Cardiac whirl from LVAD no murmurs, clicks, rubs,  or gallops. Abdomen:  LVAD port RUQ; Soft, nontender and nondistended. No masses, hepatosplenomegaly or hernias noted. Normal bowel sounds, without guarding, and without rebound.     Msk:  Symmetrical without gross deformities. Normal posture. Pulses:  Normal pulses noted. Extremities:  Without clubbing or edema. Neurologic:  Alert and  oriented x4; diffusely weak, otherwise grossly normal neurologically. Skin:  Intact without significant lesions or rashes. Psych:  Alert and cooperative. Normal mood and affect.   Lab Results:  Recent Labs  02/20/17 2054 02/21/17 0235  WBC 5.2 4.7  HGB 10.0* 8.7*  HCT 30.2* 26.4*  PLT 164 129*   BMET  Recent Labs  02/20/17 2054 02/21/17 0235  NA 134* 137  K 4.2 3.8  CL 104 108  CO2 23 21*  GLUCOSE 121* 107*  BUN 48* 44*  CREATININE 1.50* 1.38*  CALCIUM 8.9 8.4*   LFT  Recent Labs  02/20/17 2054  PROT 6.8  ALBUMIN 3.9  AST 30  ALT 23  ALKPHOS 109  BILITOT 0.7   PT/INR  Recent Labs  02/20/17 2113 02/21/17 0235  LABPROT 26.4* 27.7*  INR 2.38 2.53    Studies/Results: Dg Chest Port 1 View  Result Date: 02/20/2017 CLINICAL DATA:  Acute onset of dizziness and shortness of breath. Initial encounter. EXAM: PORTABLE CHEST 1 VIEW COMPARISON:  Chest radiograph performed 07/18/2016 FINDINGS: The lungs are well-aerated. A small left pleural effusion is suspected. There is no evidence of pneumothorax. The cardiomediastinal silhouette is mildly enlarged. The patient is status post median sternotomy. A left ventricular assist device is noted. A left chest wall pacemaker/AICD is seen, with leads ending at the right atrium and right ventricle. No acute osseous abnormalities are seen. IMPRESSION:  Suspect small left pleural effusion.  Mild cardiomegaly. Electronically Signed   By: Garald Balding M.D.   On: 02/20/2017 21:40   Impression:  1.  Anemia. 2.  Hemoccult-positive stool.  No overt GI bleeding. 3.  Heart failure with longstanding LVAD in place.  Plan:  1.  Cardiac regular diet is ok. 2.  PPI. 3.  Holding warfarin and clopidigrel. 4.  EGD/Enteroscopy likely either Saturday or early next week.  No anesthesia availability tomorrow. 5.  [facility] will follow.   LOS: 1 day   Skila Rollins M  02/21/2017, 1:54 PM  Pager [phone] If no answer or after 5 PM call [phone]

## 2017-02-21 NOTE — Progress Notes (Signed)
HeartMate 2 Rounding Note  Subjective:    Admitted with GI bleed. Hgb on admit was 10 and today down to 8.7 .   No BMs over night. Denies SOB/dizziness. 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   LVAD INTERROGATION:  HeartMate II LVAD:  Flow 3.7 liters/min, speed 8800, power 4.3, PI 7.3.  No PIs overnight.   Objective:    Vital Signs:   Temp:  [98 F (36.7 C)-98.6 F (37 C)] 98 F (36.7 C) (04/19 0408) Pulse Rate:  [62-71] 63 (04/18 2200) Resp:  [12-17] 13 (04/18 2200) BP: (83-102)/(0-78) 83/62 (04/19 0400) SpO2:  [97 %-100 %] 100 % (04/19 0400) Weight:  [191 lb (86.6 kg)-203 lb 12.8 oz (92.4 kg)] 197 lb 14.4 oz (89.8 kg) (04/19 0654) Last BM Date: 02/19/17 Mean arterial Pressure 80s   Intake/Output:   Intake/Output Summary (Last 24 hours) at 02/21/17 0704 Last data filed at 02/21/17 0458  Gross per 24 hour  Intake                0 ml  Output              400 ml  Net             -400 ml     Physical Exam: General:  Pale. Elderly. No resp difficulty. In bed HEENT: normal Neck: supple. JVP 6-7 . Carotids 2+ bilat; no bruits. No lymphadenopathy or thryomegaly appreciated. Cor: Mechanical heart sounds with LVAD hum present. Lungs: clear Abdomen: soft, nontender, nondistended. No hepatosplenomegaly. No bruits or masses. Good bowel sounds. Driveline: C/D/I; securement device intact and driveline incorporated Extremities: no cyanosis, clubbing, rash, edema Neuro: alert & orientedx3, cranial nerves grossly intact. moves all 4 extremities w/o difficulty. Affect pleasant  Telemetry: NSR   Labs: Basic Metabolic Panel:  Recent Labs Lab 02/20/17 2054 02/21/17 0235  NA 134* 137  K 4.2 3.8  CL 104 108  CO2 23 21*  GLUCOSE 121* 107*  BUN 48* 44*  CREATININE 1.50* 1.38*  CALCIUM 8.9 8.4*    Liver Function Tests:  Recent Labs Lab 02/20/17 2054  AST 30  ALT 23  ALKPHOS 109  BILITOT 0.7  PROT 6.8   ALBUMIN 3.9   No results for input(s): LIPASE, AMYLASE in the last 168 hours. No results for input(s): AMMONIA in the last 168 hours.  CBC:  Recent Labs Lab 02/20/17 2054 02/21/17 0235  WBC 5.2 4.7  NEUTROABS 2.9  --   HGB 10.0* 8.7*  HCT 30.2* 26.4*  MCV 95.3 94.0  PLT 164 129*    INR:  Recent Labs Lab 02/18/17 02/20/17 2113 02/21/17 0235  INR 2.7 2.38 2.53    Other results:    Imaging: Dg Chest Port 1 View  Result Date: 02/20/2017 CLINICAL DATA:  Acute onset of dizziness and shortness of breath. Initial encounter. EXAM: PORTABLE CHEST 1 VIEW COMPARISON:  Chest radiograph performed 07/18/2016 FINDINGS: The lungs are well-aerated. A small left pleural effusion is suspected. There is no evidence of pneumothorax. The cardiomediastinal silhouette is mildly enlarged. The patient is status post median sternotomy. A left ventricular assist device is noted. A left chest wall pacemaker/AICD is seen, with leads ending at the right atrium and right ventricle. No acute osseous abnormalities are seen. IMPRESSION: Suspect small left pleural effusion.  Mild cardiomegaly. Electronically Signed   By: Garald Balding M.D.   On: 02/20/2017 21:40  Medications:     Scheduled Medications: . amiodarone  200 mg Oral Daily  . atorvastatin  80 mg Oral q1800  . levothyroxine  200 mcg Oral Once per day on Sun Tue Wed Thu Fri Sat  . [START ON 02/25/2017] levothyroxine  300 mcg Oral Once per day on Mon  . metoprolol succinate  25 mg Oral BID  . pantoprazole (PROTONIX) IV  40 mg Intravenous Q24H  . ranolazine  500 mg Oral BID     Infusions:   PRN Medications:  acetaminophen, ondansetron (ZOFRAN) IV   Assessment/Plan/Discussion :   1. Symptomatic Anemia--> GI bleed.  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  Hgb trending down 10>8.7 Transfuse 2UPRBCs. FOBT +  Hold coumadin. INR 2.53.  Consult GI. No aspirin. On PPI.  2. Chronic Systolic HF S/P HMII LVAD DT Off  Coumadin and  plavix. Will need to start  Heparin when INR < 1.8.  Volume status stable.  Maps stable. Continue current regimen.  3. PAF- maintaining NSR.  4. H/O CVA- continue statin. No aspirin with GI bleed.     I reviewed the LVAD parameters from today, and compared the results to the patient's prior recorded data.  No programming changes were made.  The LVAD is functioning within specified parameters.  The patient performs LVAD self-test daily.  LVAD interrogation was negative for any significant power changes, alarms or PI events/speed drops.  LVAD equipment check completed and is in good working order.  Back-up equipment present.   LVAD education done on emergency procedures and precautions and reviewed exit site care.  Length of Stay: 1  Amy Clegg  NP-C  02/21/2017, 7:04 AM  VAD Team --- VAD ISSUES ONLY--- Pager [phone] (7am - 7am)  Advanced Heart Failure Team  Pager [phone] (M-F; 7a - 4p)  Please contact [facility] for night-coverage after hours (4p -7a ) and weekends on [email or web address]  Patient seen and examined with Darrick Grinder, NP. We discussed all aspects of the encounter. I agree with the assessment and plan as stated above.   HGB continue to trend down. Will give 2u RBCs. Continue IV protonix. Hold coumadin. INR 2.5 today. Discussed with PharmD will check iron stores. We have reached to GI. Will need EGD.   HF volume status ok. No need for lasix yet. May need with RBC transfusion.   He has h/o PAF. Maintaining NSR on amio.   VAD parameters improved with giving volume back.   Glori Bickers, MD  9:19 AM

## 2017-02-22 LAB — TYPE AND SCREEN
ABO/RH(D): O POS
Antibody Screen: NEGATIVE
UNIT DIVISION: 0
Unit division: 0

## 2017-02-22 LAB — BASIC METABOLIC PANEL
Anion gap: 8 (ref 5–15)
BUN: 29 mg/dL — ABNORMAL HIGH (ref 6–20)
CALCIUM: 8.6 mg/dL — AB (ref 8.9–10.3)
CO2: 22 mmol/L (ref 22–32)
Chloride: 108 mmol/L (ref 101–111)
Creatinine, Ser: 1.28 mg/dL — ABNORMAL HIGH (ref 0.61–1.24)
GFR calc non Af Amer: 52 mL/min — ABNORMAL LOW (ref >60)
Glucose, Bld: 103 mg/dL — ABNORMAL HIGH (ref 65–99)
Potassium: 4.3 mmol/L (ref 3.5–5.1)
Sodium: 138 mmol/L (ref 135–145)

## 2017-02-22 LAB — CBC
HEMATOCRIT: 32.9 % — AB (ref 39.0–52.0)
Hemoglobin: 10.8 g/dL — ABNORMAL LOW (ref 13.0–17.0)
MCH: 30.6 pg (ref 26.0–34.0)
MCHC: 32.8 g/dL (ref 30.0–36.0)
MCV: 93.2 fL (ref 78.0–100.0)
Platelets: 138 10*3/uL — ABNORMAL LOW (ref 150–400)
RBC: 3.53 MIL/uL — ABNORMAL LOW (ref 4.22–5.81)
RDW: 16.8 % — AB (ref 11.5–15.5)
WBC: 5.3 10*3/uL (ref 4.0–10.5)

## 2017-02-22 LAB — BPAM RBC
BLOOD PRODUCT EXPIRATION DATE: 201805092359
BLOOD PRODUCT EXPIRATION DATE: 201805162359
BLOOD PRODUCT EXPIRATION DATE: 201805162359
ISSUE DATE / TIME: 201804190850
ISSUE DATE / TIME: 201804191204
Unit Type and Rh: 5100
Unit Type and Rh: 5100
Unit Type and Rh: 5100

## 2017-02-22 LAB — LACTATE DEHYDROGENASE: LDH: 251 U/L — AB (ref 98–192)

## 2017-02-22 LAB — PROTIME-INR
INR: 2.05
PROTHROMBIN TIME: 23.5 s — AB (ref 11.4–15.2)

## 2017-02-22 LAB — IRON AND TIBC
Iron: 43 ug/dL — ABNORMAL LOW (ref 45–182)
Saturation Ratios: 18 % (ref 17.9–39.5)
TIBC: 237 ug/dL — AB (ref 250–450)
UIBC: 194 ug/dL

## 2017-02-22 LAB — HEMOGLOBIN AND HEMATOCRIT, BLOOD
HCT: 32 % — ABNORMAL LOW (ref 39.0–52.0)
HEMOGLOBIN: 10.9 g/dL — AB (ref 13.0–17.0)

## 2017-02-22 LAB — FERRITIN: Ferritin: 80 ng/mL (ref 24–336)

## 2017-02-22 LAB — FOLATE: FOLATE: 24.1 ng/mL (ref >5.9)

## 2017-02-22 NOTE — Progress Notes (Signed)
HeartMate 2 Rounding Note  Subjective:    Admitted with GI bleed. Hgb on admit was 10 -->8.7. Yesterday received 2 UPRBCs with hgb up 10.8 today. GI consulted. Had black bowel movement this morning.   Denies SOB. Denies dizziness.   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   LVAD INTERROGATION:  HeartMate II LVAD:  Flow 3.4 liters/min, speed 8800, power 4, PI 7.3.  No PIs overnight.   Objective:    Vital Signs:   Temp:  [97.8 F (36.6 C)-98.5 F (36.9 C)] 97.8 F (36.6 C) (04/20 0428) Pulse Rate:  [60-67] 63 (04/20 0428) Resp:  [16-18] 18 (04/20 0428) BP: (82-112)/(66-83) 82/66 (04/19 1500) SpO2:  [96 %-100 %] 99 % (04/20 0428) Weight:  [198 lb 8 oz (90 kg)] 198 lb 8 oz (90 kg) (04/20 0522) Last BM Date: 02/20/17 Mean arterial Pressure 80s   Intake/Output:   Intake/Output Summary (Last 24 hours) at 02/22/17 0808 Last data filed at 02/22/17 0515  Gross per 24 hour  Intake             1159 ml  Output             1625 ml  Net             -466 ml      Physical Exam: GENERAL: Elderly. Pale. No acute distress. HEENT: normal  NECK: Supple, JVP6-7  .  2+ bilaterally, no bruits.  No lymphadenopathy or thyromegaly appreciated.   CARDIAC:  Mechanical heart sounds with LVAD hum present.  LUNGS:  Clear to auscultation bilaterally.  ABDOMEN:  Soft, round, nontender, positive bowel sounds x4.     LVAD exit site: well-healed and incorporated.  Dressing dry and intact.  No erythema or drainage.  Stabilization device present and accurately applied.  Driveline dressing is being changed daily per sterile technique. EXTREMITIES:  Warm and dry, no cyanosis, clubbing, rash or edema  NEUROLOGIC:  Alert and oriented x 4.  Gait steady.  No aphasia.  No dysarthria.  Affect pleasant.     Telemetry: A paced 60   Labs: Basic Metabolic Panel:  Recent Labs Lab 02/20/17 2054 02/21/17 0235  NA 134* 137  K 4.2 3.8  CL 104 108  CO2 23 21*    GLUCOSE 121* 107*  BUN 48* 44*  CREATININE 1.50* 1.38*  CALCIUM 8.9 8.4*    Liver Function Tests:  Recent Labs Lab 02/20/17 2054  AST 30  ALT 23  ALKPHOS 109  BILITOT 0.7  PROT 6.8  ALBUMIN 3.9   No results for input(s): LIPASE, AMYLASE in the last 168 hours. No results for input(s): AMMONIA in the last 168 hours.  CBC:  Recent Labs Lab 02/20/17 2054 02/21/17 0235 02/22/17 0014 02/22/17 0645  WBC 5.2 4.7  --  5.3  NEUTROABS 2.9  --   --   --   HGB 10.0* 8.7* 10.9* 10.8*  HCT 30.2* 26.4* 32.0* 32.9*  MCV 95.3 94.0  --  93.2  PLT 164 129*  --  138*    INR:  Recent Labs Lab 02/18/17 02/20/17 2113 02/21/17 0235 02/22/17 0645  INR 2.7 2.38 2.53 2.05    Other results:    Imaging: Dg Chest Port 1 View  Result Date: 02/20/2017 CLINICAL DATA:  Acute onset of dizziness and shortness of breath. Initial encounter. EXAM: PORTABLE CHEST 1 VIEW COMPARISON:  Chest radiograph performed 07/18/2016 FINDINGS: The lungs are well-aerated.  A small left pleural effusion is suspected. There is no evidence of pneumothorax. The cardiomediastinal silhouette is mildly enlarged. The patient is status post median sternotomy. A left ventricular assist device is noted. A left chest wall pacemaker/AICD is seen, with leads ending at the right atrium and right ventricle. No acute osseous abnormalities are seen. IMPRESSION: Suspect small left pleural effusion.  Mild cardiomegaly. Electronically Signed   By: Garald Balding M.D.   On: 02/20/2017 21:40     Medications:     Scheduled Medications: . amiodarone  200 mg Oral Daily  . atorvastatin  80 mg Oral q1800  . levothyroxine  200 mcg Oral Once per day on Sun Tue Wed Thu Fri Sat  . [START ON 02/25/2017] levothyroxine  300 mcg Oral Once per day on Mon  . metoprolol succinate  25 mg Oral BID  . pantoprazole (PROTONIX) IV  40 mg Intravenous Q24H  . ranolazine  500 mg Oral BID    Infusions:   PRN Medications: acetaminophen,  ondansetron (ZOFRAN) IV   Assessment/Plan/Discussion :   1. Symptomatic Anemia--> GI bleed.  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  Received 2UPRBCS. Hgb up from 8.7>10.8. Hold coumadin.  Allowing INR to drift down. INR 2.05.   Consult GI. No aspirin. On PPI.  2. 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 INR down to 2.05. Start heparin once INR < 1.8. .   Volume status ok. No diuretics.  Maps stable. Continue current regimen.  3. PAF- In NSR. 4. H/O CVA- continue statin. No aspirin with GI bleed.    I reviewed the LVAD parameters from today, and compared the results to the patient's prior recorded data.  No programming changes were made.  The LVAD is functioning within specified parameters.  The patient performs LVAD self-test daily.  LVAD interrogation was negative for any significant power changes, alarms or PI events/speed drops.  LVAD equipment check completed and is in good working order.  Back-up equipment present.   LVAD education done on emergency procedures and precautions and reviewed exit site care.  Length of Stay: 2  Amy Clegg  NP-C  02/22/2017, 8:08 AM  VAD Team --- VAD ISSUES ONLY--- Pager [phone] (7am - 7am)  Advanced Heart Failure Team  Pager [phone] (M-F; 7a - 4p)  Please contact [facility] for night-coverage after hours (4p -7a ) and weekends on [email or web address]  Patient seen and examined with Darrick Grinder, NP. We discussed all aspects of the encounter. I agree with the assessment and plan as stated above.   HGb stable. No evidence on ongoing GI bleeding. On IV protonix, Volume status ok. No need for diuretics. VAD parameters look good. INR 2.05. Await GI input this am. Hopefully we can cope today or tomorrow. Likely will need to start heparin in am. Anticoagulation discussed with PharmD.  Glori Bickers, MD  10:04 AM

## 2017-02-22 NOTE — Progress Notes (Signed)
Subjective: Having no abdominal pain.  No overt blood in stool.  Objective: Vital signs in last 24 hours: Temp:  [97.8 F (36.6 C)-98 F (36.7 C)] 97.8 F (36.6 C) (04/20 0428) Pulse Rate:  [61-65] 63 (04/20 0428) Resp:  [18] 18 (04/20 0428) BP: (103)/(81) 103/81 (04/20 0825) SpO2:  [99 %-100 %] 99 % (04/20 0428) Weight:  [90 kg (198 lb 8 oz)] 90 kg (198 lb 8 oz) (04/20 0522) Weight change: 3.402 kg (7 lb 8 oz) Last BM Date: 02/21/17  PE: GEN:  NAD  Lab Results: CBC    Component Value Date/Time   WBC 5.3 02/22/2017 0645   RBC 3.53 (L) 02/22/2017 0645   HGB 10.8 (L) 02/22/2017 0645   HCT 32.9 (L) 02/22/2017 0645   HCT 32.0 (L) 06/30/2015 1115   PLT 138 (L) 02/22/2017 0645   PLT 226 06/30/2015 1115   MCV 93.2 02/22/2017 0645   MCV 95 06/30/2015 1115   MCH 30.6 02/22/2017 0645   MCHC 32.8 02/22/2017 0645   RDW 16.8 (H) 02/22/2017 0645   RDW 17.8 (H) 06/30/2015 1115   LYMPHSABS 1.4 02/20/2017 2054   LYMPHSABS 0.6 (L) 06/30/2015 1115   MONOABS 0.6 02/20/2017 2054   EOSABS 0.2 02/20/2017 2054   EOSABS 0.3 06/30/2015 1115   BASOSABS 0.0 02/20/2017 2054   BASOSABS 0.0 06/30/2015 1115   CMP     Component Value Date/Time   NA 138 02/22/2017 0645   NA 139 08/16/2014 1420   K 4.3 02/22/2017 0645   CL 108 02/22/2017 0645   CO2 22 02/22/2017 0645   GLUCOSE 103 (H) 02/22/2017 0645   BUN 29 (H) 02/22/2017 0645   BUN 22 08/16/2014 1420   CREATININE 1.28 (H) 02/22/2017 0645   CALCIUM 8.6 (L) 02/22/2017 0645   PROT 6.8 02/20/2017 2054   ALBUMIN 3.9 02/20/2017 2054   AST 30 02/20/2017 2054   ALT 23 02/20/2017 2054   ALKPHOS 109 02/20/2017 2054   BILITOT 0.7 02/20/2017 2054   GFRNONAA 52 (L) 02/22/2017 0645   GFRAA >60 02/22/2017 0645   Assessment:  1.  Anemia. 2.  Hemoccult-positive stool.  No overt GI bleeding. 3.  Heart failure with longstanding LVAD in place.  Plan:  1.  Cardiac regular diet is ok.  NPO after midnight. 2.  PPI. 3.  Holding warfarin and  clopidigrel. 4.  EGD/Enteroscopy likely either Saturday 0730. 5.  [facility] will follow.   Landry Dyke 02/22/2017, 3:15 PM   Pager [phone] If no answer or after 5 PM call [phone]

## 2017-02-22 NOTE — Progress Notes (Addendum)
Admitted 02/20/17 due to GI bleed.  HeartMate II LVAD implated on 12/01/12 by Dr. Cyndia Bent as DT VAD.  Vital signs: HR: 65 Doppler Pressure: 84 Automatic BP: 83/62 (64) O2 Sat: 98% on RA Wt: 89.8 kg/ 197 lbs   LVAD interrogation reveals:  Speed: 8800 Flow:3.4 Power: 4 PI: 7.3 Alarms: 7 low flows on 02/19/17 (pt reports he had no audible alarms, they were too brief) Events:  02/20/17 - 76 PI events; 02/19/17 - 34 PI events; 02/18/17 - 47 PI events Fixed speed: 8800 Low speed limit: 8200  Drive Line: unremarkable; weekly dressing changes done by spouse, [name]  Labs:  LDH trend: 288>229>251  INR trend: 2.38>2.53>2.05  Blood products:  02/21/17 - 2 units PRBC's  Anticoagulation Plan: -INR Goal: 2.0 - 2.5 - coumadin on hold. 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  -GI bleed this admission  Plan/Recommendations:   1. Nursing to ensure wife, [name], is performing weekly dressing changes. 2. GI consult with possible endoscopy when INR within range.  Balinda Quails RN, VAD Coordinator 24/7 pager [phone]

## 2017-02-22 NOTE — Care Management Note (Signed)
Case Management Note Marvetta Gibbons RN, BSN Unit 2W-Case Manager [phone]  Patient Details  Name: [name] MRN: [id number] Date of Birth: [birth year]  Subjective/Objective:  Pt admitted with GIB-  Hx - LVAD, plan for GI workup when INR trends down                Action/Plan: PTA pt lived at home with SO- anticipate return home- CM will follow.   Expected Discharge Date:                  Expected Discharge Plan:  Home/Self Care  In-House Referral:     Discharge planning Services  CM Consult  Post Acute Care Choice:  NA Choice offered to:  NA  DME Arranged:    DME Agency:     HH Arranged:    HH Agency:     Status of Service:  In process, will continue to follow  If discussed at Long Length of Stay Meetings, dates discussed:    Additional Comments:  Dawayne Patricia, RN 02/22/2017, 11:45 AM

## 2017-02-23 ENCOUNTER — Inpatient Hospital Stay (HOSPITAL_COMMUNITY): Payer: Medicare Other | Admitting: Anesthesiology

## 2017-02-23 ENCOUNTER — Encounter (HOSPITAL_COMMUNITY): Payer: Self-pay

## 2017-02-23 ENCOUNTER — Encounter (HOSPITAL_COMMUNITY)
Admission: EM | Disposition: A | Discharge status: Home / Self Care | Payer: Self-pay | Source: Home / Self Care | Attending: Internal Medicine

## 2017-02-23 DIAGNOSIS — I480 Paroxysmal atrial fibrillation: Secondary | ICD-10-CM

## 2017-02-23 HISTORY — PX: ENTEROSCOPY: SHX5533

## 2017-02-23 LAB — CBC
HEMATOCRIT: 33.3 % — AB (ref 39.0–52.0)
HEMOGLOBIN: 11 g/dL — AB (ref 13.0–17.0)
MCH: 31.3 pg (ref 26.0–34.0)
MCHC: 33 g/dL (ref 30.0–36.0)
MCV: 94.6 fL (ref 78.0–100.0)
Platelets: 135 10*3/uL — ABNORMAL LOW (ref 150–400)
RBC: 3.52 MIL/uL — AB (ref 4.22–5.81)
RDW: 17.1 % — ABNORMAL HIGH (ref 11.5–15.5)
WBC: 5.7 10*3/uL (ref 4.0–10.5)

## 2017-02-23 LAB — PROTIME-INR
INR: 1.64
Prothrombin Time: 19.6 seconds — ABNORMAL HIGH (ref 11.4–15.2)

## 2017-02-23 LAB — BASIC METABOLIC PANEL
Anion gap: 8 (ref 5–15)
BUN: 25 mg/dL — AB (ref 6–20)
CO2: 23 mmol/L (ref 22–32)
Calcium: 8.7 mg/dL — ABNORMAL LOW (ref 8.9–10.3)
Chloride: 106 mmol/L (ref 101–111)
Creatinine, Ser: 1.36 mg/dL — ABNORMAL HIGH (ref 0.61–1.24)
GFR calc Af Amer: 56 mL/min — ABNORMAL LOW (ref >60)
GFR calc non Af Amer: 49 mL/min — ABNORMAL LOW (ref >60)
GLUCOSE: 108 mg/dL — AB (ref 65–99)
POTASSIUM: 4.2 mmol/L (ref 3.5–5.1)
Sodium: 137 mmol/L (ref 135–145)

## 2017-02-23 LAB — LACTATE DEHYDROGENASE: LDH: 260 U/L — AB (ref 98–192)

## 2017-02-23 LAB — HEPARIN LEVEL (UNFRACTIONATED): Heparin Unfractionated: 0.61 IU/mL (ref 0.30–0.70)

## 2017-02-23 SURGERY — ENTEROSCOPY
Anesthesia: Monitor Anesthesia Care | Laterality: Left

## 2017-02-23 MED ORDER — PEG 3350-KCL-NA BICARB-NACL 420 G PO SOLR
4000.0000 mL | Freq: Once | ORAL | Status: AC
  Administered 2017-02-23: 4000 mL via ORAL
  Filled 2017-02-23: qty 4000

## 2017-02-23 MED ORDER — HEPARIN (PORCINE) IN NACL 100-0.45 UNIT/ML-% IJ SOLN
1100.0000 [IU]/h | INTRAMUSCULAR | Status: DC
  Filled 2017-02-23: qty 250

## 2017-02-23 MED ORDER — PROPOFOL 500 MG/50ML IV EMUL
INTRAVENOUS | Status: DC | PRN
  Administered 2017-02-23: 10 ug via INTRAVENOUS
  Administered 2017-02-23: 5 ug via INTRAVENOUS
  Administered 2017-02-23 (×2): 10 ug via INTRAVENOUS
  Administered 2017-02-23: 5 ug via INTRAVENOUS
  Administered 2017-02-23: 10 ug via INTRAVENOUS

## 2017-02-23 MED ORDER — SODIUM CHLORIDE 0.9 % IV SOLN: INTRAVENOUS | Status: DC

## 2017-02-23 MED ORDER — FENTANYL CITRATE (PF) 100 MCG/2ML IJ SOLN
INTRAMUSCULAR | Status: DC | PRN
  Administered 2017-02-23: 25 ug via INTRAVENOUS

## 2017-02-23 MED ORDER — HEPARIN (PORCINE) IN NACL 100-0.45 UNIT/ML-% IJ SOLN: 950.0000 [IU]/h | INTRAMUSCULAR | Status: AC

## 2017-02-23 MED ORDER — HEPARIN (PORCINE) IN NACL 100-0.45 UNIT/ML-% IJ SOLN: 1100.0000 [IU]/h | INTRAMUSCULAR | Status: DC

## 2017-02-23 MED ORDER — MIDAZOLAM HCL 5 MG/5ML IJ SOLN
INTRAMUSCULAR | Status: DC | PRN
  Administered 2017-02-23 (×2): 0.5 mg via INTRAVENOUS

## 2017-02-23 NOTE — Progress Notes (Signed)
HeartMate 2 Rounding Note  Subjective:    Admitted with GI bleed. Hgb on admit was 10 -->8.7. Received 2 UPRBCs on 4/19    Had large episode of melena yesterday. No further bleeding. Hgb stable 10.8 -> 11.0 today. Feels good. No dyspnea or CP.   EGD with push enteroscopy today with no obvious source of bleeding. Pending colonoscopy tomorrow.    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   LVAD INTERROGATION:  HeartMate II LVAD:  Flow 3.4 liters/min, speed 8800, power 4, PI 7.3.  No PIs overnight.   Objective:    Vital Signs:   Temp:  [97.8 F (36.6 C)-97.9 F (36.6 C)] 97.9 F (36.6 C) (04/21 0812) Pulse Rate:  [51-68] 51 (04/21 0820) Resp:  [15-22] 15 (04/21 0820) BP: (68-103)/(0-81) 84/0 (04/21 0820) SpO2:  [95 %-99 %] 95 % (04/21 0820) Weight:  [88.6 kg (195 lb 6.4 oz)] 88.6 kg (195 lb 6.4 oz) (04/21 0427) Last BM Date: 02/21/17 Mean arterial Pressure 70-80s   Intake/Output:   Intake/Output Summary (Last 24 hours) at 02/23/17 0824 Last data filed at 02/23/17 0755  Gross per 24 hour  Intake                0 ml  Output              350 ml  Net             -350 ml      Physical Exam: GENERAL: Lying in bed.NAD HEENT: normal  NECK: Supple, JVP 7 .  2+ bilaterally, no bruits.  No lymphadenopathy or thyromegaly appreciated.   CARDIAC:  Mechanical heart sounds with LVAD hum present. Pump sounds good. LUNGS:  Clear ABDOMEN:  Soft, round, nontender. Good BS LVAD exit site: well-healed and incorporated.  Dressing dry and intact.  No erythema or drainage.  Stabilization device present and accurately applied.  Driveline dressing is being changed daily per sterile technique. Site looks good  EXTREMITIES:  Warm and dry, no cyanosis, clubbing, rash. No edema NEUROLOGIC:  Alert and oriented x 4.  Gait steady.  No aphasia.  No dysarthria.  Affect pleasant.     Telemetry: A paced 60 Personally reviewed   Labs: Basic Metabolic  Panel:  Recent Labs Lab 02/20/17 2054 02/21/17 0235 02/22/17 0645 02/23/17 0416  NA 134* 137 138 137  K 4.2 3.8 4.3 4.2  CL 104 108 108 106  CO2 23 21* 22 23  GLUCOSE 121* 107* 103* 108*  BUN 48* 44* 29* 25*  CREATININE 1.50* 1.38* 1.28* 1.36*  CALCIUM 8.9 8.4* 8.6* 8.7*    Liver Function Tests:  Recent Labs Lab 02/20/17 2054  AST 30  ALT 23  ALKPHOS 109  BILITOT 0.7  PROT 6.8  ALBUMIN 3.9   No results for input(s): LIPASE, AMYLASE in the last 168 hours. No results for input(s): AMMONIA in the last 168 hours.  CBC:  Recent Labs Lab 02/20/17 2054 02/21/17 0235 02/22/17 0014 02/22/17 0645 02/23/17 0416  WBC 5.2 4.7  --  5.3 5.7  NEUTROABS 2.9  --   --   --   --   HGB 10.0* 8.7* 10.9* 10.8* 11.0*  HCT 30.2* 26.4* 32.0* 32.9* 33.3*  MCV 95.3 94.0  --  93.2 94.6  PLT 164 129*  --  138* 135*    INR:  Recent Labs Lab 02/18/17 02/20/17 2113 02/21/17 0235 02/22/17 0645 02/23/17 0416  INR 2.7 2.38 2.53 2.05 1.64    Other results:    Imaging: No results found.   Medications:     Scheduled Medications: . amiodarone  200 mg Oral Daily  . atorvastatin  80 mg Oral q1800  . levothyroxine  200 mcg Oral Once per day on Sun Tue Wed Thu Fri Sat  . [START ON 02/25/2017] levothyroxine  300 mcg Oral Once per day on Mon  . metoprolol succinate  25 mg Oral BID  . pantoprazole (PROTONIX) IV  40 mg Intravenous Q24H  . ranolazine  500 mg Oral BID    Infusions: . heparin      PRN Medications: acetaminophen, ondansetron (ZOFRAN) IV   Assessment/Plan/Discussion :   1. Symptomatic Anemia--> GI bleed.  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  -Received 2UPRBCS. Hgb stable today -I spoke with Dr. Cristina Gong today and EGD with deep push was negative. Planning colonoscopy tomorrow.  -Off coumadin. INR 1.6. Will start heparin today. D/w with HF pharmacy -Given melena I was highly suspicious of an upper source (typically small intestinal AVMs) but nothing  found on deep push. Await results of colon. If negative, will reload coumadin. If rebleeds will need capsule.  -Holding ASA for now  2. 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 Watch closely with colon prep  - Off diuretics.  - Maps stable. Continue current regimen.  - VAD parameters look good  3. PAF -- Remains in NSR. Continue amio  4. H/O CVA- continue statin. No aspirin with GI bleed.    I reviewed the LVAD parameters from today, and compared the results to the patient's prior recorded data.  No programming changes were made.  The LVAD is functioning within specified parameters.  The patient performs LVAD self-test daily.  LVAD interrogation was negative for any significant power changes, alarms or PI events/speed drops.  LVAD equipment check completed and is in good working order.  Back-up equipment present.   LVAD education done on emergency procedures and precautions and reviewed exit site care.  Length of Stay: 3    Antero Derosia MD 02/23/2017, 8:24 AM  VAD Team --- VAD ISSUES ONLY--- Pager [phone] (7am - 7am)  Advanced Heart Failure Team  Pager [phone] (M-F; 7a - 4p)  Please contact [facility] for night-coverage after hours (4p -7a ) and weekends on [email or web address]

## 2017-02-23 NOTE — Progress Notes (Addendum)
[place] for heparin Indication: LVAD  Allergies  Allergen Reactions  . Demerol [Meperidine] Other (See Comments)    Paralysis. Could only move eyes. Paralysis. Could only move eyes.  [name] Opium Other (See Comments)    "CAN'T MOVE" CATATONIC PER PT.    Patient Measurements: Height: 6' (182.9 cm) Weight: 195 lb 6.4 oz (88.6 kg) IBW/kg (Calculated) : 77.6  Vital Signs: Temp: 97.9 F (36.6 C) (04/21 0812) Temp Source: Oral (04/21 0812) BP: 88/0 (04/21 1200) Pulse Rate: 51 (04/21 0820)  Labs:  Recent Labs  02/20/17 2054  02/21/17 0235 02/22/17 0014 02/22/17 0645 02/23/17 0416 02/23/17 1642  HGB 10.0*  --  8.7* 10.9* 10.8* 11.0*  --   HCT 30.2*  --  26.4* 32.0* 32.9* 33.3*  --   PLT 164  --  129*  --  138* 135*  --   LABPROT  --   < > 27.7*  --  23.5* 19.6*  --   INR  --   < > 2.53  --  2.05 1.64  --   HEPARINUNFRC  --   --   --   --   --   --  0.61  CREATININE 1.50*  --  1.38*  --  1.28* 1.36*  --   TROPONINI <0.03  --   --   --   --   --   --   < > = values in this interval not displayed.  Estimated Creatinine Clearance: 49.9 mL/min (A) (by C-G formula based on SCr of 1.36 mg/dL (H)).  Scheduled:  . amiodarone  200 mg Oral Daily  . atorvastatin  80 mg Oral q1800  . levothyroxine  200 mcg Oral Once per day on Sun Tue Wed Thu Fri Sat  . [START ON 02/25/2017] levothyroxine  300 mcg Oral Once per day on Mon  . metoprolol succinate  25 mg Oral BID  . pantoprazole (PROTONIX) IV  40 mg Intravenous Q24H  . ranolazine  500 mg Oral BID    Assessment: 76yo male admitted w/ GIB, on Coumadin PTA for LVAD, now INR 1.6, heparin started this AM while ruling out GIB. CBC low but stable.  EGD this morning was negative, for colonoscopy tomorrow. Per discussion with Cardiology, will restart heparin and stop 6 hours prior to colonoscopy scheduled for 0730.   Initial heparin level supratherapeutic for lower goal at 0.61 on 1100 units/h. No bleed  or IV line issues per RN.  Goal of Therapy:  Heparin level 0.3-0.5 units/ml (cautious 2/2 anemia/GIB) Monitor platelets by anticoagulation protocol: Yes   Plan:  Decrease heparin IV to 950 units/h - off at 0130 for procedure F/u resume anticoagulation post-procedure as appropriate Monitor CBC, s/sx bleeding   Elicia Lamp, PharmD, BCPS Clinical Pharmacist 02/23/2017 5:24 PM

## 2017-02-23 NOTE — Progress Notes (Addendum)
ANTICOAGULATION CONSULT NOTE - Initial Consult  Pharmacy Consult for heparin Indication: LVAD  Allergies  Allergen Reactions  . Demerol [Meperidine] Other (See Comments)    Paralysis. Could only move eyes. Paralysis. Could only move eyes.  [name] Opium Other (See Comments)    "CAN'T MOVE" CATATONIC PER PT.    Patient Measurements: Height: 6' (182.9 cm) Weight: 195 lb 6.4 oz (88.6 kg) IBW/kg (Calculated) : 77.6  Vital Signs: Temp: 97.9 F (36.6 C) (04/21 0416) Temp Source: Oral (04/20 2101) Pulse Rate: 62 (04/21 0416)  Labs:  Recent Labs  02/20/17 2054  02/21/17 0235 02/22/17 0014 02/22/17 0645 02/23/17 0416  HGB 10.0*  --  8.7* 10.9* 10.8* 11.0*  HCT 30.2*  --  26.4* 32.0* 32.9* 33.3*  PLT 164  --  129*  --  138* 135*  LABPROT  --   < > 27.7*  --  23.5* 19.6*  INR  --   < > 2.53  --  2.05 1.64  CREATININE 1.50*  --  1.38*  --  1.28*  --   TROPONINI <0.03  --   --   --   --   --   < > = values in this interval not displayed.  Estimated Creatinine Clearance: 53 mL/min (A) (by C-G formula based on SCr of 1.28 mg/dL (H)).   Medical History: Past Medical History:  Diagnosis Date  . Arthritis   . Automatic implantable cardioverter-defibrillator in situ    a. s/p Medtronic Evera device serial [id number]    . Bradycardia   . CAD (coronary artery disease)    a. s/p MI 1982;  s/p DES to CFX 2011;  s/p NSTEMI 4/12 in setting of VTach;  cath 7/12:  LM ok, LAD mid 30-40%, Dx's with 40%, mCFX stent patent, prox to mid RCA occluded with R to R and L to R collats.  Medical Tx was continued  . Chronic systolic heart failure ([place])    a. s/p LVAD 10/2013 for destination therapy  . CKD (chronic kidney disease)   . CVD (cerebrovascular disease)    a. s/p L CEA 2008  . Cytopenia   . Dysrhythmia    AFIB  . History of GI bleed    a. on ASA- started on plavix during 10/2015 admission for CVA  . HLD (hyperlipidemia)   . HTN (hypertension)   . Hypothyroidism   . Inappropriate  therapy from implantable cardioverter-defibrillator    a. ATP for sinus tach SVT wavelet identified SVT but was no  passive (2014)  . Ischemic cardiomyopathy   . 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 Myocardial infarction ([place])    1992  . PAF (paroxysmal atrial fibrillation) ([place])   . PVD (peripheral vascular disease) ([place])    a. 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 Shortness of breath dyspnea    W/ EXERTION   . Sleep apnea    NO CPAP NOW  HE SENT IT BACK YRS AGO  . Stroke [facility])    a. 10/2015 admission   . Ventricular tachycardia ([place])    a. s/p AICD;   h/o ICD shock;  Amiodarone Rx. S/P ablation in 2012    Medications:  Prescriptions Prior to Admission  Medication Sig Dispense Refill Last Dose  . amiodarone (PACERONE) 200 MG tablet Take 200 mg  by mouth daily.   02/20/2017 at Unknown time  . atorvastatin (LIPITOR) 80 MG tablet Take 1 tablet (80 mg total) by mouth daily at 6 PM. 30 tablet 6 02/19/2017 at Unknown time  . clopidogrel (PLAVIX) 75 MG tablet Take 1 tablet (75 mg total) by mouth daily. 90 tablet 3 02/20/2017 at Unknown time  . docusate sodium (COLACE) 100 MG capsule Take 100 mg by mouth daily as needed for mild constipation.    PRN  . furosemide (LASIX) 20 MG tablet Take 1 tablet (20 mg total) by mouth as needed for edema (weight gain). 30 tablet 3 PRN  . levothyroxine (SYNTHROID, LEVOTHROID) 200 MCG tablet TAKE 1 TABLET DAILY BEFORE BREAKFAST WITH 1 & 1/2 TABLETS EVERY MONDAY. 45 tablet 6 02/20/2017 at Unknown time  . metoprolol succinate (TOPROL-XL) 25 MG 24 hr tablet Take 1 tablet (25 mg total) by mouth 2 (two) times daily. 180 tablet 3 02/20/2017 at Unknown time  . Multiple Vitamins-Minerals (ICAPS PO) Take 1 tablet by mouth 2 (two) times daily.   02/20/2017 at Unknown time  . nitroGLYCERIN (NITROSTAT) 0.4 MG SL tablet Place 0.4 mg under the tongue every 5 (five) minutes as needed  for chest pain.   PRN  . pantoprazole (PROTONIX) 40 MG tablet TAKE 1 TABLET TWICE DAILY 60 tablet 3 02/20/2017 at Unknown time  . ranolazine (RANEXA) 500 MG 12 hr tablet TAKE 1 TABLET (500 MG TOTAL) BY MOUTH 2 (TWO) TIMES DAILY. 60 tablet 6 02/20/2017 at Unknown time  . zinc gluconate 50 MG tablet Take 50 mg by mouth 2 (two) times daily.    02/20/2017 at Unknown time  . warfarin (COUMADIN) 2 MG tablet TAKE AS DIRECTED (Patient taking differently: Take 2 mg on Mon / Wed / Fri . Take 4 mg on Tues / Thurs/ Sat / Sun) 50 tablet 3 02/19/2017   Scheduled:  . amiodarone  200 mg Oral Daily  . atorvastatin  80 mg Oral q1800  . levothyroxine  200 mcg Oral Once per day on Sun Tue Wed Thu Fri Sat  . [START ON 02/25/2017] levothyroxine  300 mcg Oral Once per day on Mon  . metoprolol succinate  25 mg Oral BID  . pantoprazole (PROTONIX) IV  40 mg Intravenous Q24H  . ranolazine  500 mg Oral BID    Assessment: 76yo male admitted w/ GIB, on Coumadin PTA for LVAD, now INR 1.6, to begin heparin bridge while awaiting EGD/enteroscopy.  Goal of Therapy:  Heparin level 0.3-0.5 units/ml (cautious 2/2 anemia/GIB) Monitor platelets by anticoagulation protocol: Yes   Plan:  Will begin heparin gtt at 1100 units/hr and monitor heparin levels and CBC.  Wynona Neat, PharmD, BCPS  02/23/2017,6:09 AM   ADDENDUM: Order to hold heparin until after endo. VB 6:33 AM

## 2017-02-23 NOTE — Progress Notes (Signed)
[facility] for heparin Indication: LVAD  Allergies  Allergen Reactions  . Demerol [Meperidine] Other (See Comments)    Paralysis. Could only move eyes. Paralysis. Could only move eyes.  [name] Opium Other (See Comments)    "CAN'T MOVE" CATATONIC PER PT.    Patient Measurements: Height: 6' (182.9 cm) Weight: 195 lb 6.4 oz (88.6 kg) IBW/kg (Calculated) : 77.6  Vital Signs: Temp: 97.9 F (36.6 C) (04/21 0812) Temp Source: Oral (04/21 0812) BP: 84/0 (04/21 0820) Pulse Rate: 51 (04/21 0820)  Labs:  Recent Labs  02/20/17 2054  02/21/17 0235 02/22/17 0014 02/22/17 0645 02/23/17 0416  HGB 10.0*  --  8.7* 10.9* 10.8* 11.0*  HCT 30.2*  --  26.4* 32.0* 32.9* 33.3*  PLT 164  --  129*  --  138* 135*  LABPROT  --   < > 27.7*  --  23.5* 19.6*  INR  --   < > 2.53  --  2.05 1.64  CREATININE 1.50*  --  1.38*  --  1.28* 1.36*  TROPONINI <0.03  --   --   --   --   --   < > = values in this interval not displayed.  Estimated Creatinine Clearance: 49.9 mL/min (A) (by C-G formula based on SCr of 1.36 mg/dL (H)).  Scheduled:  . amiodarone  200 mg Oral Daily  . atorvastatin  80 mg Oral q1800  . levothyroxine  200 mcg Oral Once per day on Sun Tue Wed Thu Fri Sat  . [START ON 02/25/2017] levothyroxine  300 mcg Oral Once per day on Mon  . metoprolol succinate  25 mg Oral BID  . pantoprazole (PROTONIX) IV  40 mg Intravenous Q24H  . ranolazine  500 mg Oral BID    Assessment: 76yo male admitted w/ GIB, on Coumadin PTA for LVAD, now INR 1.6, heparin to start this am while ruling out GIB.  EGD this morning was negative, for colonoscopy tomorrow.   Per discussion with cardiology will restart heparin now and stop 6 hours prior to colonoscopy, procedure time pending anesthesia availability, will need to follow up.  Goal of Therapy:  Heparin level 0.3-0.5 units/ml (cautious 2/2 anemia/GIB) Monitor platelets by anticoagulation protocol: Yes   Plan:  Will  begin heparin gtt at 1100 units/hr and monitor heparin levels and CBC.  Erin Hearing PharmD., BCPS Clinical Pharmacist Pager [phone] 02/23/2017 8:35 AM

## 2017-02-23 NOTE — Anesthesia Preprocedure Evaluation (Addendum)
Anesthesia Evaluation  Patient identified by MRN, date of birth, ID band Patient awake    Reviewed: Allergy & Precautions, H&P , NPO status , Patient's Chart, lab work & pertinent test results  Airway Mallampati: III  TM Distance: >3 FB Neck ROM: Full    Dental no notable dental hx. (+) Edentulous Upper, Edentulous Lower, Dental Advisory Given   Pulmonary sleep apnea , former smoker,    Pulmonary exam normal breath sounds clear to auscultation       Cardiovascular hypertension, Pt. on medications and Pt. on home beta blockers + CAD, + Past MI, + Cardiac Stents, + Peripheral Vascular Disease and +CHF  + dysrhythmias Atrial Fibrillation + Cardiac Defibrillator  Rhythm:Regular Rate:Normal  LVAD   Neuro/Psych CVA negative psych ROS   GI/Hepatic negative GI ROS, Neg liver ROS,   Endo/Other  Hypothyroidism   Renal/GU Renal InsufficiencyRenal disease  negative genitourinary   Musculoskeletal  (+) Arthritis , Osteoarthritis,    Abdominal   Peds  Hematology negative hematology ROS (+)   Anesthesia Other Findings   Reproductive/Obstetrics negative OB ROS                            Anesthesia Physical Anesthesia Plan  ASA: IV  Anesthesia Plan: MAC   Post-op Pain Management:    Induction: Intravenous  Airway Management Planned: Nasal Cannula  Additional Equipment:   Intra-op Plan:   Post-operative Plan:   Informed Consent: I have reviewed the patients History and Physical, chart, labs and discussed the procedure including the risks, benefits and alternatives for the proposed anesthesia with the patient or authorized representative who has indicated his/her understanding and acceptance.   Dental advisory given  Plan Discussed with: CRNA  Anesthesia Plan Comments:         Anesthesia Quick Evaluation

## 2017-02-23 NOTE — Transfer of Care (Signed)
Immediate Anesthesia Transfer of Care Note  Patient: [name]  Procedure(s) Performed: Procedure(s): ENTEROSCOPY (Left)  Patient Location: PACU  Anesthesia Type:General  Level of Consciousness: awake, alert , oriented and patient cooperative  Airway & Oxygen Therapy: Patient Spontanous Breathing and Patient connected to nasal cannula oxygen  Post-op Assessment: Report given to RN and Post -op Vital signs reviewed and stable  Post vital signs: Reviewed  Last Vitals: Doppler sys bp: 88, HR 68, RR 20, sat 96% Vitals:   02/23/17 0416 02/23/17 0707  BP:  (!) 90/0  Pulse: 62 61  Resp: 18 16  Temp: 36.6 C 36.6 C    Last Pain:  Vitals:   02/23/17 0707  TempSrc: Oral  PainSc:          Complications: No apparent anesthesia complications

## 2017-02-23 NOTE — Anesthesia Postprocedure Evaluation (Signed)
Anesthesia Post Note  Patient: [name]  Procedure(s) Performed: Procedure(s) (LRB): ENTEROSCOPY (Left)  Patient location during evaluation: PACU Anesthesia Type: MAC Level of consciousness: awake and alert Pain management: pain level controlled Vital Signs Assessment: post-procedure vital signs reviewed and stable Respiratory status: spontaneous breathing, nonlabored ventilation and respiratory function stable Cardiovascular status: stable and blood pressure returned to baseline Anesthetic complications: no       Last Vitals:  Vitals:   02/23/17 0815 02/23/17 0820  BP: (!) 80/0 (!) 84/0  Pulse: 68 (!) 51  Resp: (!) 22 15  Temp:      Last Pain:  Vitals:   02/23/17 0812  TempSrc: Oral  PainSc:                  Halcyon Heck,W. EDMOND

## 2017-02-23 NOTE — H&P (View-Only) (Signed)
Subjective: Having no abdominal pain.  No overt blood in stool.  Objective: Vital signs in last 24 hours: Temp:  [97.8 F (36.6 C)-98 F (36.7 C)] 97.8 F (36.6 C) (04/20 0428) Pulse Rate:  [61-65] 63 (04/20 0428) Resp:  [18] 18 (04/20 0428) BP: (103)/(81) 103/81 (04/20 0825) SpO2:  [99 %-100 %] 99 % (04/20 0428) Weight:  [90 kg (198 lb 8 oz)] 90 kg (198 lb 8 oz) (04/20 0522) Weight change: 3.402 kg (7 lb 8 oz) Last BM Date: 02/21/17  PE: GEN:  NAD  Lab Results: CBC    Component Value Date/Time   WBC 5.3 02/22/2017 0645   RBC 3.53 (L) 02/22/2017 0645   HGB 10.8 (L) 02/22/2017 0645   HCT 32.9 (L) 02/22/2017 0645   HCT 32.0 (L) 06/30/2015 1115   PLT 138 (L) 02/22/2017 0645   PLT 226 06/30/2015 1115   MCV 93.2 02/22/2017 0645   MCV 95 06/30/2015 1115   MCH 30.6 02/22/2017 0645   MCHC 32.8 02/22/2017 0645   RDW 16.8 (H) 02/22/2017 0645   RDW 17.8 (H) 06/30/2015 1115   LYMPHSABS 1.4 02/20/2017 2054   LYMPHSABS 0.6 (L) 06/30/2015 1115   MONOABS 0.6 02/20/2017 2054   EOSABS 0.2 02/20/2017 2054   EOSABS 0.3 06/30/2015 1115   BASOSABS 0.0 02/20/2017 2054   BASOSABS 0.0 06/30/2015 1115   CMP     Component Value Date/Time   NA 138 02/22/2017 0645   NA 139 08/16/2014 1420   K 4.3 02/22/2017 0645   CL 108 02/22/2017 0645   CO2 22 02/22/2017 0645   GLUCOSE 103 (H) 02/22/2017 0645   BUN 29 (H) 02/22/2017 0645   BUN 22 08/16/2014 1420   CREATININE 1.28 (H) 02/22/2017 0645   CALCIUM 8.6 (L) 02/22/2017 0645   PROT 6.8 02/20/2017 2054   ALBUMIN 3.9 02/20/2017 2054   AST 30 02/20/2017 2054   ALT 23 02/20/2017 2054   ALKPHOS 109 02/20/2017 2054   BILITOT 0.7 02/20/2017 2054   GFRNONAA 52 (L) 02/22/2017 0645   GFRAA >60 02/22/2017 0645   Assessment:  1.  Anemia. 2.  Hemoccult-positive stool.  No overt GI bleeding. 3.  Heart failure with longstanding LVAD in place.  Plan:  1.  Cardiac regular diet is ok.  NPO after midnight. 2.  PPI. 3.  Holding warfarin and  clopidigrel. 4.  EGD/Enteroscopy likely either Saturday 0730. 5.  [facility] will follow.   Landry Dyke 02/22/2017, 3:15 PM   Pager [phone] If no answer or after 5 PM call [phone]

## 2017-02-23 NOTE — Progress Notes (Signed)
Push enteroscopy was technically successful, reaching well into the jejunum, and was negative for any source of blood loss.  No interventions performed.  Pt tolerated procedure well.  Have d/w Dr. Haroldine Laws.  Current plan is for colonoscopy tomorrow (if Anesthesia available)--it has been at least 3.5 yrs since his last colonoscopy, probably somewhat longer (pt/wife don't recall exact date, but it was before his LVAD was placed).  Risks reviewed, pt/wife agreeable.  Heparin bridge has been ordered.  Cleotis Nipper, M.D. Pager [phone] If no answer or after 5 PM call [phone]

## 2017-02-23 NOTE — Op Note (Signed)
[facility] Patient Name: [name] Procedure Date : 02/23/2017 MRN: [id number] Attending MD: Ronald Lobo , MD Date of Birth: [birth year] CSN: [id number] Age: 76 Admit Type: Inpatient Procedure:                Small bowel enteroscopy Indications:              (Sub)Acute post hemorrhagic anemia (4 gm drop in                            hgb over the past month, without any overt                            bleeding), Heme positive stool, Obscure                            gastrointestinal bleeding. Pt has a left                            ventricular assist device and is on chronic                            anticoagulation w/ coumadin. 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 Benson Norway. Providers:                Ronald Lobo, MD, Dortha Schwalbe RN, RN, Alfonso Patten, Technician, Mardene Celeste "Trish" Durene Romans, CRNA Referring MD:              Medicines:                Monitored Anesthesia Care Complications:            No immediate complications. Estimated Blood Loss:     Estimated blood loss: none. Procedure:                Pre-Anesthesia Assessment:                           - Prior to the procedure, a History and Physical                            was performed, and patient medications and                            allergies were reviewed. The patient's tolerance of                            previous anesthesia was also reviewed. The risks  and benefits of the procedure and the sedation                            options and risks were discussed with the patient.                            All questions were answered, and informed consent                            was obtained. Prior Anticoagulants: The patient has                            taken Coumadin (warfarin), last dose was 3 days                            prior to  procedure. INR 1.64 this morning. ASA                            Grade Assessment: IV - A patient with severe                            systemic disease that is a constant threat to life.                            After reviewing the risks and benefits, the patient                            was deemed in satisfactory condition to undergo the                            procedure. The LVAD coordinator nurse was at                            bedside for procedure.                           After obtaining informed consent, the endoscope was                            passed under direct vision. Throughout the                            procedure, the patient's blood pressure, pulse, and                            oxygen saturations were monitored continuously. The                            EC-[id number] ([id number]) ultraslim colonoscope was used                            as an enteroscope and was introduced through the  mouth and advanced to the proximal jejunum. The                            small bowel enteroscopy was accomplished without                            difficulty. The patient tolerated the procedure                            well. Scope In: Scope Out: Findings:      The esophagus was normal apart from a 1 cm hiatal hernia.      The stomach was normal. It contained a small amount of bile, but no       blood or coffee ground material.      There was no evidence of significant pathology in the entire examined       duodenum.      There was no evidence of significant pathology in the proximal jejunum.       Particular effort was made to detect any avm's or vascular lesions based       on pt's prior history, but none were seen.      The cardia and gastric fundus were normal on retroflexion. Impression:               - Normal esophagus.                           - Normal stomach.                           - Normal examined duodenum.                            - The examined portion of the jejunum was normal.                           - No specimens collected.                           - No source of subacute blood loss evident on                            current examination. Recommendation:           - Perform a colonoscopy tomorrow or whenever                            Anesthesia and support services are available. Procedure Code(s):        --- Professional ---                           [phone], Small intestinal endoscopy, enteroscopy                            beyond second portion of duodenum, not including                            ileum;  diagnostic, including collection of                            specimen(s) by brushing or washing, when performed                            (separate procedure) Diagnosis Code(s):        --- Professional ---                           K92.2, Gastrointestinal hemorrhage, unspecified                           D62, Acute posthemorrhagic anemia CPT copyright 2016 American Medical Association. All rights reserved. The codes documented in this report are preliminary and upon coder review may  be revised to meet current compliance requirements. Ronald Lobo, MD 02/23/2017 8:22:02 AM This report has been signed electronically. Number of Addenda: 0

## 2017-02-23 NOTE — Progress Notes (Signed)
VAD Coordinator Procedure Note:   Patient underwent enteroscopy. Hemodynamics and VAD parameters monitored by me throughout the procedure. MAPs were obtained with manual BP cuff on Right that correlates with doppler pre procedure.    Doppler Auto cuff(MAP):  Flow: PI: Power:     Speed:      MAP:          Pre-procedure:100 109/82(90)      3.3  7.8 4.6      8800  100  Sedation Induction:  88/75(80)      3.8  7.2 4.5      8800     Interventions: none   Recovery area:80  Unable to obtain    3.5  6.3   4.3       8800    Patient tolerated the procedure well. PIs were 7.3-7.8 throughout the case with no power elevations. After adequate sedation was achieved, pulse ox 97 and maintained >92% throughout the remainder of the procedure. MAPs were 70-80s.   Patient Disposition: pt was accompanied by VAD coordinator back to 2W24 on batteries.

## 2017-02-23 NOTE — Interval H&P Note (Signed)
History and Physical Interval Note:  02/23/2017 7:36 AM  [name]  has presented today for surgery, with the diagnosis of anemia, hemoccult-positive stools, LVAD  The various methods of treatment have been discussed with the patient and family. After consideration of risks, benefits and other options for treatment, the patient has consented to  Procedure(s): ENTEROSCOPY (Left) as a surgical intervention .  The patient's history has been reviewed, patient examined, no change in status, stable for surgery.  I have reviewed the patient's chart and labs.  Questions were answered to the patient's satisfaction.    Pt's INR is 1.6, w/ intent to start heparin post-procedure.  Pt is comfortable lying in bed, in NAD.  Hgb stable since Tx.  Still no localizing GI sx.  LVAD coordinator nurse at bedside.    Pt states he walks 1-1.5 miles per day.  No recent colonoscopy---at least 5 yrs per pt/wife.  EXAM: nad, chest clr, heart sds virtually inaudible but hum of LVAD present.  Abd slt adipose, no mass or tenderness.  Oropharynx benign. No periph edema.  IMPR:  Heart failure pt currently well-compensated w/ recent gradual "silent" downward drift in hgb (4 gm over 1 month) and prior h/o small jejunal avm's apc'd several yrs ago.  PLAN:   Enteroscopy, apc any signif lesions, initiate heparin thereafter, colonoscopy TBA if today's exam neg.  Cleotis Nipper, M.D. Pager [phone] If no answer or after 5 PM call [phone]     Cleotis Nipper

## 2017-02-24 ENCOUNTER — Encounter (HOSPITAL_COMMUNITY): Payer: Self-pay

## 2017-02-24 ENCOUNTER — Encounter (HOSPITAL_COMMUNITY)
Admission: EM | Disposition: A | Discharge status: Home / Self Care | Payer: Self-pay | Source: Home / Self Care | Attending: Internal Medicine

## 2017-02-24 ENCOUNTER — Inpatient Hospital Stay (HOSPITAL_COMMUNITY): Payer: Medicare Other | Admitting: Anesthesiology

## 2017-02-24 HISTORY — PX: COLONOSCOPY WITH PROPOFOL: SHX5780

## 2017-02-24 LAB — BASIC METABOLIC PANEL
ANION GAP: 8 (ref 5–15)
BUN: 18 mg/dL (ref 6–20)
CHLORIDE: 109 mmol/L (ref 101–111)
CO2: 24 mmol/L (ref 22–32)
CREATININE: 1.29 mg/dL — AB (ref 0.61–1.24)
Calcium: 8.7 mg/dL — ABNORMAL LOW (ref 8.9–10.3)
GFR, EST NON AFRICAN AMERICAN: 52 mL/min — AB (ref >60)
GLUCOSE: 98 mg/dL (ref 65–99)
POTASSIUM: 4.3 mmol/L (ref 3.5–5.1)
Sodium: 141 mmol/L (ref 135–145)

## 2017-02-24 LAB — PROTIME-INR
INR: 1.57
Prothrombin Time: 19 seconds — ABNORMAL HIGH (ref 11.4–15.2)

## 2017-02-24 LAB — CBC
HCT: 34.3 % — ABNORMAL LOW (ref 39.0–52.0)
Hemoglobin: 10.8 g/dL — ABNORMAL LOW (ref 13.0–17.0)
MCH: 30 pg (ref 26.0–34.0)
MCHC: 31.5 g/dL (ref 30.0–36.0)
MCV: 95.3 fL (ref 78.0–100.0)
PLATELETS: 143 10*3/uL — AB (ref 150–400)
RBC: 3.6 MIL/uL — ABNORMAL LOW (ref 4.22–5.81)
RDW: 16.7 % — AB (ref 11.5–15.5)
WBC: 5.1 10*3/uL (ref 4.0–10.5)

## 2017-02-24 LAB — HEPARIN LEVEL (UNFRACTIONATED): HEPARIN UNFRACTIONATED: 0.31 [IU]/mL (ref 0.30–0.70)

## 2017-02-24 LAB — LACTATE DEHYDROGENASE: LDH: 255 U/L — ABNORMAL HIGH (ref 98–192)

## 2017-02-24 SURGERY — COLONOSCOPY WITH PROPOFOL
Anesthesia: Monitor Anesthesia Care

## 2017-02-24 MED ORDER — PROPOFOL 10 MG/ML IV BOLUS
INTRAVENOUS | Status: DC | PRN
  Administered 2017-02-24 (×10): 5 mg via INTRAVENOUS
  Administered 2017-02-24: 10 mg via INTRAVENOUS

## 2017-02-24 MED ORDER — SODIUM CHLORIDE 0.9 % IV SOLN
INTRAVENOUS | Status: DC
  Administered 2017-02-24 (×2): via INTRAVENOUS

## 2017-02-24 MED ORDER — MIDAZOLAM HCL 5 MG/5ML IJ SOLN
INTRAMUSCULAR | Status: DC | PRN
  Administered 2017-02-24 (×2): 0.5 mg via INTRAVENOUS

## 2017-02-24 MED ORDER — WARFARIN - PHARMACIST DOSING INPATIENT: Freq: Every day | Status: DC

## 2017-02-24 MED ORDER — WARFARIN SODIUM 5 MG PO TABS
5.0000 mg | ORAL_TABLET | Freq: Once | ORAL | Status: AC
  Administered 2017-02-24: 5 mg via ORAL
  Filled 2017-02-24: qty 1

## 2017-02-24 MED ORDER — HEPARIN (PORCINE) IN NACL 100-0.45 UNIT/ML-% IJ SOLN
1000.0000 [IU]/h | INTRAMUSCULAR | Status: DC
  Administered 2017-02-24 – 2017-02-27 (×4): 950 [IU]/h via INTRAVENOUS
  Filled 2017-02-24 (×3): qty 250

## 2017-02-24 MED ORDER — PROPOFOL 500 MG/50ML IV EMUL: INTRAVENOUS | Status: DC | PRN

## 2017-02-24 MED ORDER — PANTOPRAZOLE SODIUM 40 MG PO TBEC
40.0000 mg | DELAYED_RELEASE_TABLET | Freq: Every day | ORAL | Status: DC
  Administered 2017-02-24 – 2017-02-27 (×4): 40 mg via ORAL
  Filled 2017-02-24 (×4): qty 1

## 2017-02-24 MED ORDER — GLYCOPYRROLATE 0.2 MG/ML IV SOSY
PREFILLED_SYRINGE | INTRAVENOUS | Status: DC | PRN
  Administered 2017-02-24: .2 mg via INTRAVENOUS

## 2017-02-24 SURGICAL SUPPLY — 21 items

## 2017-02-24 NOTE — Progress Notes (Signed)
Heparin stopped at 0130 02/24/17 per phone call with Brittney May.

## 2017-02-24 NOTE — Op Note (Signed)
[facility] Patient Name: [name] Procedure Date : 05-Aug-202018 MRN: [id number] Attending MD: Ronald Lobo , MD Date of Birth: Feb 17, 1941 CSN: [id number] Age: 76 Admit Type: Inpatient Procedure:                Colonoscopy Indications:              Last colonoscopy: date unknown (at least 3 years                            ago), Heme positive stool, anemia secondary to                            subacute blood loss--4 gm drop in hgb over the past                            month without evident bleeding; negative                            enteroscopy yesterday. Providers:                Ronald Lobo, MD, Zenon Mayo, RN, Alfonso Patten,                            Technician, Mardene Celeste "Trish" Durene Romans, CRNA Referring MD:              Medicines:                Monitored Anesthesia Care Complications:            No immediate complications. Estimated Blood Loss:     Estimated blood loss: none. Procedure:                Pre-Anesthesia Assessment:                           - Prior to the procedure, a History and Physical                            was performed, and patient medications and                            allergies were reviewed. The patient's tolerance of                            previous anesthesia was also reviewed. The risks                            and benefits of the procedure and the sedation                            options and risks were discussed with the patient.                            All questions were answered, and informed consent  was obtained. Prior Anticoagulants: The patient has                            taken heparin, last dose was day of procedure. ASA                            Grade Assessment: IV - A patient with severe                            systemic disease that is a constant threat to life.                            After reviewing the risks and benefits, the patient                             was deemed in satisfactory condition to undergo the                            procedure.                           After obtaining informed consent, the colonoscope                            was passed under direct vision. Throughout the                            procedure, the patient's blood pressure, pulse, and                            oxygen saturations were monitored continuously. The                            EC-[id number] ([id number]) scope was introduced through                            the anus and advanced to the the cecum, identified                            by appendiceal orifice and ileocecal valve. The                            colonoscopy was performed without difficulty. The                            patient tolerated the procedure well. The quality                            of the bowel preparation was excellent. The                            appendiceal orifice and the rectum were  photographed. Scope In: 8:02:54 AM Scope Out: 8:17:39 AM Scope Withdrawal Time: 0 hours 10 minutes 49 seconds  Total Procedure Duration: 0 hours 14 minutes 45 seconds  Findings:      The digital rectal exam was normal. Pertinent negatives include normal       prostate (size, shape, and consistency).      Multiple medium-mouthed diverticula were found in the sigmoid colon.      No other significant abnormalities were identified in a careful       examination of the remainder of the colon.      There is no endoscopic evidence of bleeding, inflammation, mass, polyps       or angiodysplasia in the entire colon.      There were moderate internal hemorrhoids noted during pullout through       the anal canal.      The retroflexed view of the distal rectum and anal verge was normal and       showed no anal or rectal abnormalities. Reinspection of the rectum       showed no additional findings. Impression:               - Diverticulosis in the sigmoid colon.                            - The distal rectum and anal verge are normal on                            retroflexion view.                           - No specimens collected.                           - No source of recent drop in hemoglobin or heme                            positivity seen. Moderate Sedation:      This patient was sedated with monitored anesthesia care, not moderate       sedation. Recommendation:           - Resume regular diet today.                           - Continue present medications.                           - Resume heparin bridge and coumadin (warfarin) at                            prior dose per Cardiology; will defer to cardiology                            physician for further adjustment of therapy.                           - No repeat colonoscopy due to age and the absence  of colonic polyps.                           - Per discussion yesterday with Dr. Sung Amabile, our                            current plan is to observe patient once back on                            anticoagulation; if there are recurrent problems                            with anemia/blood loss, consider capsule endoscopy                            at that point. Procedure Code(s):        --- Professional ---                           [phone], Colonoscopy, flexible; diagnostic, including                            collection of specimen(s) by brushing or washing,                            when performed (separate procedure) Diagnosis Code(s):        --- Professional ---                           R19.5, Other fecal abnormalities                           D50.0, Iron deficiency anemia secondary to blood                            loss (chronic) CPT copyright 2016 American Medical Association. All rights reserved. The codes documented in this report are preliminary and upon coder review may  be revised to meet current compliance requirements. Ronald Lobo,  MD Feb 16, 202018 8:36:03 AM This report has been signed electronically. Number of Addenda: 0

## 2017-02-24 NOTE — Anesthesia Postprocedure Evaluation (Signed)
Anesthesia Post Note  Patient: [name]  Procedure(s) Performed: Procedure(s) (LRB): COLONOSCOPY WITH PROPOFOL (N/A)  Patient location during evaluation: PACU Anesthesia Type: MAC Level of consciousness: awake and alert Pain management: pain level controlled Vital Signs Assessment: post-procedure vital signs reviewed and stable Respiratory status: spontaneous breathing, nonlabored ventilation, respiratory function stable and patient connected to nasal cannula oxygen Cardiovascular status: stable and blood pressure returned to baseline Anesthetic complications: no       Last Vitals:  Vitals:   02/24/17 0830 02/24/17 0831  BP:    Pulse: (!) 54 80  Resp: 16 (!) 22  Temp:  36.6 C    Last Pain:  Vitals:   02/24/17 0831  TempSrc: Oral  PainSc:                  Tiajuana Amass

## 2017-02-24 NOTE — Progress Notes (Signed)
VAD Coordinator Procedure Note:   Patient underwent colonscopy. Hemodynamics and VAD parameters monitored by me throughout the procedure. MAPs were obtained with manual BP cuff on left arm with dopplering of the left radial artery.      Doppler Auto cuff(MAP):  Flow: PI: Power:     Speed:               Pre-procedure:92  115/86(94)      3.5  7.6 4.3      8800            Sedation Induction:    85/73(79)      3.4  7.3 4.6      8800   0810   73/57(64)      3.3  6.2 4.3       8800     Interventions: NONE  Recovery area:92         3.6  6.6 4.4       8800    Patient tolerated the procedure well. PIs were 6.2-7.6 throughout the case with no power elevations. After adequate sedation was achieved, pulse ox 100% and maintained >92% throughout the remainder of the procedure. MAPs were 90s.   Patient Disposition: Pt was transported alert and oriented back to 2W24 accompanied by VAD coordinator on batteries.

## 2017-02-24 NOTE — Anesthesia Preprocedure Evaluation (Signed)
Anesthesia Evaluation  Patient identified by MRN, date of birth, ID band Patient awake    Reviewed: Allergy & Precautions, H&P , NPO status , Patient's Chart, lab work & pertinent test results  Airway Mallampati: III  TM Distance: >3 FB Neck ROM: Full    Dental no notable dental hx. (+) Edentulous Upper, Edentulous Lower, Dental Advisory Given   Pulmonary sleep apnea , former smoker,    Pulmonary exam normal breath sounds clear to auscultation       Cardiovascular hypertension, Pt. on medications and Pt. on home beta blockers + CAD, + Past MI, + Cardiac Stents, + Peripheral Vascular Disease and +CHF  + dysrhythmias Atrial Fibrillation + Cardiac Defibrillator  Rhythm:Regular Rate:Normal  LVAD   Neuro/Psych CVA negative psych ROS   GI/Hepatic negative GI ROS, Neg liver ROS,   Endo/Other  Hypothyroidism   Renal/GU Renal InsufficiencyRenal disease  negative genitourinary   Musculoskeletal  (+) Arthritis , Osteoarthritis,    Abdominal   Peds  Hematology negative hematology ROS (+)   Anesthesia Other Findings   Reproductive/Obstetrics negative OB ROS                            Anesthesia Physical Anesthesia Plan  ASA: IV  Anesthesia Plan: MAC   Post-op Pain Management:    Induction: Intravenous  Airway Management Planned: Nasal Cannula  Additional Equipment:   Intra-op Plan:   Post-operative Plan:   Informed Consent: I have reviewed the patients History and Physical, chart, labs and discussed the procedure including the risks, benefits and alternatives for the proposed anesthesia with the patient or authorized representative who has indicated his/her understanding and acceptance.   Dental advisory given  Plan Discussed with: CRNA  Anesthesia Plan Comments:         Anesthesia Quick Evaluation  

## 2017-02-24 NOTE — Progress Notes (Signed)
HeartMate 2 Rounding Note  Subjective:    Admitted with GI bleed. Hgb on admit was 10 -->8.7. Received 2 UPRBCs on 4/19    Had large episode of melena 4/20  .No further bleeding. Underwent colon prep last night. Hgb stable. Feels fine otherwise.  EGD with push enteroscopy on 4/21 was normal.    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   LVAD INTERROGATION:  HeartMate II LVAD:  Flow 3.7 liters/min, speed 8800, power 4.0, PI 7.1.  No PIs overnight.   Objective:    Vital Signs:   Temp:  [97.4 F (36.3 C)-98.1 F (36.7 C)] 98.1 F (36.7 C) (04/22 0736) Pulse Rate:  [51-80] 80 (04/22 0736) Resp:  [12-22] 12 (04/22 0736) BP: (68-106)/(0-89) 106/89 (04/22 0736) SpO2:  [95 %-98 %] 98 % (04/22 0736) Weight:  [89.9 kg (198 lb 3.2 oz)] 89.9 kg (198 lb 3.2 oz) (04/22 0554) Last BM Date: 02/23/17 Mean arterial Pressure 70-80s   Intake/Output:   Intake/Output Summary (Last 24 hours) at 02/24/17 0810 Last data filed at 02/23/17 1630  Gross per 24 hour  Intake              480 ml  Output              550 ml  Net              -70 ml      Physical Exam: GENERAL: sitting in chair .NAD HEENT: normal  Anicteric  NECK: Supple, JVP 8 .  2+ bilaterally, no bruits.  No lymphadenopathy or thyromegaly appreciated.   CARDIAC:  Mechanical heart sounds with LVAD hum present. Pump sounds good. LUNGS:  Clear ABDOMEN:  Soft. NT. ND  + BS LVAD exit site: well-healed and incorporated.  Dressing dry and intact.  No erythema or drainage.  Stabilization device present and accurately applied.  Driveline dressing is being changed daily per sterile technique. Site looks good.  EXTREMITIES:  Warm and dry, no cyanosis, clubbing, rash. No edema.  NEUROLOGIC:  Alert and oriented x 4.  Gait steady.  No aphasia.  No dysarthria.  Affect pleasant.     Telemetry: A paced 60 Personally reviewed    Labs: Basic Metabolic Panel:  Recent Labs Lab  02/20/17 2054 02/21/17 0235 02/22/17 0645 02/23/17 0416 02/24/17 0405  NA 134* 137 138 137 141  K 4.2 3.8 4.3 4.2 4.3  CL 104 108 108 106 109  CO2 23 21* 22 23 24   GLUCOSE 121* 107* 103* 108* 98  BUN 48* 44* 29* 25* 18  CREATININE 1.50* 1.38* 1.28* 1.36* 1.29*  CALCIUM 8.9 8.4* 8.6* 8.7* 8.7*    Liver Function Tests:  Recent Labs Lab 02/20/17 2054  AST 30  ALT 23  ALKPHOS 109  BILITOT 0.7  PROT 6.8  ALBUMIN 3.9   No results for input(s): LIPASE, AMYLASE in the last 168 hours. No results for input(s): AMMONIA in the last 168 hours.  CBC:  Recent Labs Lab 02/20/17 2054 02/21/17 0235 02/22/17 0014 02/22/17 0645 02/23/17 0416 02/24/17 0405  WBC 5.2 4.7  --  5.3 5.7 5.1  NEUTROABS 2.9  --   --   --   --   --   HGB 10.0* 8.7* 10.9* 10.8* 11.0* 10.8*  HCT 30.2* 26.4* 32.0* 32.9* 33.3* 34.3*  MCV 95.3 94.0  --  93.2 94.6 95.3  PLT 164 129*  --  138* 135* 143*  INR:  Recent Labs Lab 02/20/17 2113 02/21/17 0235 02/22/17 0645 02/23/17 0416 02/24/17 0405  INR 2.38 2.53 2.05 1.64 1.57    Other results:    Imaging: No results found.   Medications:     Scheduled Medications: . [MAR Hold] amiodarone  200 mg Oral Daily  . [MAR Hold] atorvastatin  80 mg Oral q1800  . [MAR Hold] levothyroxine  200 mcg Oral Once per day on Sun Tue Wed Thu Fri Sat  . [MAR Hold] levothyroxine  300 mcg Oral Once per day on Mon  . [MAR Hold] metoprolol succinate  25 mg Oral BID  . [MAR Hold] pantoprazole (PROTONIX) IV  40 mg Intravenous Q24H  . [MAR Hold] ranolazine  500 mg Oral BID    Infusions: . sodium chloride      PRN Medications: [MAR Hold] acetaminophen, [MAR Hold] ondansetron (ZOFRAN) IV   Assessment/Plan/Discussion :   1. Symptomatic Anemia--> GI bleed.  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  -Received 2UPRBCS. Hgb stable today -EGD with deep push on 4/21  Was negative - For colonoscopy today -Off coumadin. INR 1.57. On heparin gtt. Start  coumadin tonight. D/w HF pharmD -Given melena I was highly suspicious of an upper source (typically small intestinal AVMs) but nothing found on deep push. Await results of colon. If negative, will reload coumadin. If rebleeds will need capsule.  -Holding ASA for now  2. 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 Off diuretics. Can give lasix as needed - Maps stable. Continue current regimen.  - VAD parameters reviewed personally  ok 3. PAF -- Remains in NSR. Continue amio  4. H/O CVA- continue statin. No aspirin with GI bleed.    I reviewed the LVAD parameters from today, and compared the results to the patient's prior recorded data.  No programming changes were made.  The LVAD is functioning within specified parameters.  The patient performs LVAD self-test daily.  LVAD interrogation was negative for any significant power changes, alarms or PI events/speed drops.  LVAD equipment check completed and is in good working order.  Back-up equipment present.   LVAD education done on emergency procedures and precautions and reviewed exit site care.  Length of Stay: 4    Bensimhon, Daniel MD 13-May-202018, 8:10 AM  VAD Team --- VAD ISSUES ONLY--- Pager [phone] (7am - 7am)  Advanced Heart Failure Team  Pager [phone] (M-F; 7a - 4p)  Please contact [facility] for night-coverage after hours (4p -7a ) and weekends on [email or web address]

## 2017-02-24 NOTE — Progress Notes (Signed)
Colonoscopy well-tolerated, and NEGATIVE for any source of anemia or heme positivity.  (see rept for details).  Have ordered resumption of diet.  From GI standpoint, ok for resumption of anticoagulation at any time.  Per discussion with Dr. Haroldine Laws, current plan is for no further GI workup unless pt shows evidence of further bld loss in the future, in which case we would probably do a capsule endoscopy.  I will sign off at this time; please call us if we can be of further assistance with this patient.  Cleotis Nipper, M.D. Pager [phone] If no answer or after 5 PM call [phone]

## 2017-02-24 NOTE — Interval H&P Note (Signed)
History and Physical Interval Note:  23-Aug-202018 7:43 AM  [name]  has presented today for colonoscopy, with the diagnosis of heme positive stool and anemia  The various methods of treatment have been discussed with the patient and family. After consideration of risks, benefits and other options for treatment, the patient has consented to  Procedure(s): COLONOSCOPY WITH PROPOFOL (N/A) as a surgical intervention .  The patient's history has been reviewed, patient examined, no change in status, stable for surgery.  I have reviewed the patient's chart and labs.  Questions were answered to the patient's satisfaction.     [name]

## 2017-02-24 NOTE — Progress Notes (Signed)
[place] for heparin Indication: LVAD  Allergies  Allergen Reactions  . Demerol [Meperidine] Other (See Comments)    Paralysis. Could only move eyes. Paralysis. Could only move eyes.  [name] Opium Other (See Comments)    "CAN'T MOVE" CATATONIC PER PT.    Patient Measurements: Height: 6' (182.9 cm) Weight: 198 lb 3.2 oz (89.9 kg) IBW/kg (Calculated) : 77.6  Vital Signs: Temp: 97.7 F (36.5 C) (04/22 1440) Temp Source: Oral (04/22 1440) BP: 79/63 (04/22 1440) Pulse Rate: 58 (04/22 1440)  Labs:  Recent Labs  02/22/17 0645 02/23/17 0416 02/23/17 1642 02/24/17 0405 02/24/17 1841  HGB 10.8* 11.0*  --  10.8*  --   HCT 32.9* 33.3*  --  34.3*  --   PLT 138* 135*  --  143*  --   LABPROT 23.5* 19.6*  --  19.0*  --   INR 2.05 1.64  --  1.57  --   HEPARINUNFRC  --   --  0.61  --  0.31  CREATININE 1.28* 1.36*  --  1.29*  --     Estimated Creatinine Clearance: 52.6 mL/min (A) (by C-G formula based on SCr of 1.29 mg/dL (H)).  Scheduled:  . amiodarone  200 mg Oral Daily  . atorvastatin  80 mg Oral q1800  . levothyroxine  200 mcg Oral Once per day on Sun Tue Wed Thu Fri Sat  . [START ON 02/25/2017] levothyroxine  300 mcg Oral Once per day on Mon  . metoprolol succinate  25 mg Oral BID  . pantoprazole  40 mg Oral Daily  . ranolazine  500 mg Oral BID  . Warfarin - Pharmacist Dosing Inpatient   Does not apply q1800    Assessment: 76yo male admitted w/ GIB, on Coumadin PTA for LVAD.  INR 1.57, per discussion with HF team will resume heparin and warfarin today.  EGD 4/21 was negative, colonoscopy 4/22 negative.   Keep previous INR goal with negative GI workup.  Heparin level therapeutic at 0.31. CBC low stable. No bleed reported.  Goal of Therapy:  INR goal 2-2.5 Heparin level 0.3-0.5 units/ml (cautious 2/2 anemia/GIB) Monitor platelets by anticoagulation protocol: Yes   Plan:  Continue heparin at 950 units/hr 8h heparin level to  confirm Daily heparin level/CBC/INR Monitor for s/sx bleeding   Elicia Lamp, PharmD, BCPS Clinical Pharmacist 07-25-202018 7:37 PM

## 2017-02-24 NOTE — Progress Notes (Signed)
[place] for heparin Indication: LVAD  Allergies  Allergen Reactions  . Demerol [Meperidine] Other (See Comments)    Paralysis. Could only move eyes. Paralysis. Could only move eyes.  [name] Opium Other (See Comments)    "CAN'T MOVE" CATATONIC PER PT.    Patient Measurements: Height: 6' (182.9 cm) Weight: 198 lb 3.2 oz (89.9 kg) IBW/kg (Calculated) : 77.6  Vital Signs: Temp: 97.8 F (36.6 C) (04/22 0831) Temp Source: Oral (04/22 0831) BP: 74/58 (04/22 0828) Pulse Rate: 80 (04/22 0831)  Labs:  Recent Labs  02/22/17 0645 02/23/17 0416 02/23/17 1642 02/24/17 0405  HGB 10.8* 11.0*  --  10.8*  HCT 32.9* 33.3*  --  34.3*  PLT 138* 135*  --  143*  LABPROT 23.5* 19.6*  --  19.0*  INR 2.05 1.64  --  1.57  HEPARINUNFRC  --   --  0.61  --   CREATININE 1.28* 1.36*  --  1.29*    Estimated Creatinine Clearance: 52.6 mL/min (A) (by C-G formula based on SCr of 1.29 mg/dL (H)).  Scheduled:  . amiodarone  200 mg Oral Daily  . atorvastatin  80 mg Oral q1800  . levothyroxine  200 mcg Oral Once per day on Sun Tue Wed Thu Fri Sat  . [START ON 02/25/2017] levothyroxine  300 mcg Oral Once per day on Mon  . metoprolol succinate  25 mg Oral BID  . pantoprazole (PROTONIX) IV  40 mg Intravenous Q24H  . ranolazine  500 mg Oral BID    Assessment: 76yo male admitted w/ GIB, on Coumadin PTA for LVAD.  INR 1.57, per discussion with HF team will resume heparin and warfarin today.  EGD 4/21 was negative, colonoscopy 4/22 negative.   Keep previous INR goal with negative GI workup.  Goal of Therapy:  INR goal 2-2.5 Heparin level 0.3-0.5 units/ml (cautious 2/2 anemia/GIB) Monitor platelets by anticoagulation protocol: Yes   Plan:  Resume heparin at 950 units/hr now Warfarin 5mg  tonight Daily INR  Erin Hearing PharmD., BCPS Clinical Pharmacist Pager [phone] February 23, 202018 9:11 AM

## 2017-02-24 NOTE — Transfer of Care (Signed)
Immediate Anesthesia Transfer of Care Note  Patient: [name]  Procedure(s) Performed: Procedure(s): COLONOSCOPY WITH PROPOFOL (N/A)  Patient Location: PACU  Anesthesia Type:General  Level of Consciousness: awake, alert , oriented and patient cooperative  Airway & Oxygen Therapy: Patient Spontanous Breathing and Patient connected to nasal cannula oxygen  Post-op Assessment: Report given to RN and Post -op Vital signs reviewed and stable  Post vital signs: Reviewed and stable  Last Vitals: BP dopple 92, 74/56, MAP 63, 65, 20, 100% Vitals:   02/24/17 0830 02/24/17 0831  BP:    Pulse: (!) 54 (!) 34  Resp: 16 (!) 22  Temp:      Last Pain:  Vitals:   02/24/17 0736  TempSrc: Oral  PainSc:          Complications: No apparent anesthesia complications

## 2017-02-25 LAB — CBC
HEMATOCRIT: 33.1 % — AB (ref 39.0–52.0)
HEMOGLOBIN: 10.6 g/dL — AB (ref 13.0–17.0)
MCH: 30.5 pg (ref 26.0–34.0)
MCHC: 32 g/dL (ref 30.0–36.0)
MCV: 95.1 fL (ref 78.0–100.0)
Platelets: 136 10*3/uL — ABNORMAL LOW (ref 150–400)
RBC: 3.48 MIL/uL — AB (ref 4.22–5.81)
RDW: 16.7 % — AB (ref 11.5–15.5)
WBC: 5.3 10*3/uL (ref 4.0–10.5)

## 2017-02-25 LAB — PROTIME-INR
INR: 1.48
Prothrombin Time: 18.1 seconds — ABNORMAL HIGH (ref 11.4–15.2)

## 2017-02-25 LAB — BASIC METABOLIC PANEL
ANION GAP: 6 (ref 5–15)
BUN: 16 mg/dL (ref 6–20)
CHLORIDE: 108 mmol/L (ref 101–111)
CO2: 25 mmol/L (ref 22–32)
Calcium: 8.7 mg/dL — ABNORMAL LOW (ref 8.9–10.3)
Creatinine, Ser: 1.3 mg/dL — ABNORMAL HIGH (ref 0.61–1.24)
GFR calc Af Amer: 59 mL/min — ABNORMAL LOW (ref >60)
GFR calc non Af Amer: 51 mL/min — ABNORMAL LOW (ref >60)
Glucose, Bld: 117 mg/dL — ABNORMAL HIGH (ref 65–99)
POTASSIUM: 4.6 mmol/L (ref 3.5–5.1)
SODIUM: 139 mmol/L (ref 135–145)

## 2017-02-25 LAB — HEPARIN LEVEL (UNFRACTIONATED): Heparin Unfractionated: 0.42 IU/mL (ref 0.30–0.70)

## 2017-02-25 LAB — LACTATE DEHYDROGENASE: LDH: 247 U/L — ABNORMAL HIGH (ref 98–192)

## 2017-02-25 MED ORDER — WARFARIN SODIUM 5 MG PO TABS
5.0000 mg | ORAL_TABLET | Freq: Once | ORAL | Status: AC
  Administered 2017-02-25: 5 mg via ORAL
  Filled 2017-02-25: qty 1

## 2017-02-25 NOTE — Progress Notes (Signed)
[place] for heparin / warfarin Indication: LVAD  Allergies  Allergen Reactions  . Demerol [Meperidine] Other (See Comments)    Paralysis. Could only move eyes. Paralysis. Could only move eyes.  [name] Opium Other (See Comments)    "CAN'T MOVE" CATATONIC PER PT.    Patient Measurements: Height: 6' (182.9 cm) Weight: 199 lb 9.6 oz (90.5 kg) IBW/kg (Calculated) : 77.6  Vital Signs: Temp: 98 F (36.7 C) (04/23 0500) Temp Source: Oral (04/23 0500) BP: 91/74 (04/23 0818) Pulse Rate: 69 (04/23 0818)  Labs:  Recent Labs  02/23/17 0416 02/23/17 1642 02/24/17 0405 02/24/17 1841 02/25/17 0229  HGB 11.0*  --  10.8*  --  10.6*  HCT 33.3*  --  34.3*  --  33.1*  PLT 135*  --  143*  --  136*  LABPROT 19.6*  --  19.0*  --  18.1*  INR 1.64  --  1.57  --  1.48  HEPARINUNFRC  --  0.61  --  0.31 0.42  CREATININE 1.36*  --  1.29*  --  1.30*    Estimated Creatinine Clearance: 52.2 mL/min (A) (by C-G formula based on SCr of 1.3 mg/dL (H)).  Scheduled:  . amiodarone  200 mg Oral Daily  . atorvastatin  80 mg Oral q1800  . levothyroxine  200 mcg Oral Once per day on Sun Tue Wed Thu Fri Sat  . levothyroxine  300 mcg Oral Once per day on Mon  . metoprolol succinate  25 mg Oral BID  . pantoprazole  40 mg Oral Daily  . ranolazine  500 mg Oral BID  . Warfarin - Pharmacist Dosing Inpatient   Does not apply q1800    Assessment: 76yo male admitted w/ GIB, on Coumadin PTA for LVAD. Admit INR 2.38 at goal.  Warfarin  Held for GI work up EGD negative, colonoscopy - diverticulosis -no bleeding  - hold asa/clopidogrel Restart heparin bridge to warfarin  Heparin drip 950 uts/hr HL 0.42 at goal  INR 1.48 repeat boost dose Warfarin 5mg  x1  CBC low stable. No bleed reported. Keep previous INR goal with negative GI workup.   Goal of Therapy:  INR goal 2-2.5 Heparin level 0.3-0.5 units/ml (cautious 2/2 anemia/GIB) Monitor platelets by anticoagulation protocol:  Yes   Plan:  Continue heparin at 950 units/hr Warfarin 5mg  x1 tonight  Daily heparin level/CBC/INR Monitor for s/sx bleeding  Bonnita Nasuti Pharm.D. CPP, BCPS Clinical Pharmacist [phone] 02/25/2017 12:45 PM

## 2017-02-25 NOTE — Progress Notes (Signed)
HeartMate 2 Rounding Note  Subjective:    Admitted with GI bleed. Hgb on admit was 10 -->8.7. Received 2 UPRBCs on 4/19    EGD with push enteroscopy on 4/21 was normal.   Colonoscopy 4/22 --> diverticulosis sigmoid colon.   Overall feeling ok. Denies SOB. Hgb stable 10. 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   LVAD INTERROGATION:  HeartMate II LVAD:  Flow 3.3 liters/min, speed 8800, power 4.1, PI 7.9. He Couple low flows noted this morning --> 2.4.    Objective:    Vital Signs:   Temp:  [97.6 F (36.4 C)-98.1 F (36.7 C)] 98 F (36.7 C) (04/23 0500) Pulse Rate:  [48-80] 71 (04/22 2158) Resp:  [12-25] 22 (04/22 1440) BP: (70-114)/(55-89) 89/75 (04/23 0500) SpO2:  [97 %-100 %] 97 % (04/23 0500) Weight:  [199 lb 9.6 oz (90.5 kg)] 199 lb 9.6 oz (90.5 kg) (04/23 0500) Last BM Date: 02/23/17 Mean arterial Pressure 70s  Intake/Output:   Intake/Output Summary (Last 24 hours) at 02/25/17 0734 Last data filed at 02/25/17 0400  Gross per 24 hour  Intake           661.98 ml  Output                0 ml  Net           661.98 ml      Physical Exam: Physical Exam: GENERAL: Well appearing, male sitting in the chair. no acute distress. HEENT: normal  NECK: Supple, JVP 5-6 .  2+ bilaterally, no bruits.  No lymphadenopathy or thyromegaly appreciated.   CARDIAC:  Mechanical heart sounds with LVAD hum present.  LUNGS:  Clear to auscultation bilaterally.  ABDOMEN:  Soft, round, nontender, positive bowel sounds x4.     LVAD exit site: well-healed and incorporated.  Dressing dry and intact.  No erythema or drainage.  Stabilization device present and accurately applied.  Driveline dressing is being changed daily per sterile technique. EXTREMITIES:  Warm and dry, no cyanosis, clubbing, rash or edema  NEUROLOGIC:  Alert and oriented x 4.  Gait steady.  No aphasia.  No dysarthria.  Affect pleasant.       Telemetry: A paced 60-70s.  Personally reviewed.    Labs: Basic Metabolic Panel:  Recent Labs Lab 02/21/17 0235 02/22/17 0645 02/23/17 0416 02/24/17 0405 02/25/17 0229  NA 137 138 137 141 139  K 3.8 4.3 4.2 4.3 4.6  CL 108 108 106 109 108  CO2 21* 22 23 24 25   GLUCOSE 107* 103* 108* 98 117*  BUN 44* 29* 25* 18 16  CREATININE 1.38* 1.28* 1.36* 1.29* 1.30*  CALCIUM 8.4* 8.6* 8.7* 8.7* 8.7*    Liver Function Tests:  Recent Labs Lab 02/20/17 2054  AST 30  ALT 23  ALKPHOS 109  BILITOT 0.7  PROT 6.8  ALBUMIN 3.9   No results for input(s): LIPASE, AMYLASE in the last 168 hours. No results for input(s): AMMONIA in the last 168 hours.  CBC:  Recent Labs Lab 02/20/17 2054 02/21/17 0235 02/22/17 0014 02/22/17 0645 02/23/17 0416 02/24/17 0405 02/25/17 0229  WBC 5.2 4.7  --  5.3 5.7 5.1 5.3  NEUTROABS 2.9  --   --   --   --   --   --   HGB 10.0* 8.7* 10.9* 10.8* 11.0* 10.8* 10.6*  HCT 30.2* 26.4* 32.0* 32.9* 33.3* 34.3* 33.1*  MCV 95.3  94.0  --  93.2 94.6 95.3 95.1  PLT 164 129*  --  138* 135* 143* 136*    INR:  Recent Labs Lab 02/21/17 0235 02/22/17 0645 02/23/17 0416 02/24/17 0405 02/25/17 0229  INR 2.53 2.05 1.64 1.57 1.48    Other results:    Imaging: No results found.   Medications:     Scheduled Medications: . amiodarone  200 mg Oral Daily  . atorvastatin  80 mg Oral q1800  . levothyroxine  200 mcg Oral Once per day on Sun Tue Wed Thu Fri Sat  . levothyroxine  300 mcg Oral Once per day on Mon  . metoprolol succinate  25 mg Oral BID  . pantoprazole  40 mg Oral Daily  . ranolazine  500 mg Oral BID  . Warfarin - Pharmacist Dosing Inpatient   Does not apply q1800    Infusions: . heparin 950 Units/hr (02/24/17 1832)    PRN Medications: acetaminophen, ondansetron (ZOFRAN) IV   Assessment/Plan/Discussion :   1. Symptomatic Anemia--> GI bleed.  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  -Received 2UPRBCS. Hgb stable today.  -EGD with deep push on 4/21  Was  negative -S/P Colonoscopy 4/22 with diverticulosis noted in sigmoid colon. No bleeding.  -Continue coumadin + heparin.   INR 1.48.  -Holding ASA for now  2. 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  VAD parameters stable.  3. PAF -- In NSR. Continue low dose amio.   4. H/O CVA- continue statin. No aspirin with GI bleed.    Home once INR close to 1.8. Likely Wednesday   I reviewed the LVAD parameters from today, and compared the results to the patient's prior recorded data.  No programming changes were made.  The LVAD is functioning within specified parameters.  The patient performs LVAD self-test daily.  LVAD interrogation was negative for any significant power changes, alarms or PI events/speed drops.  LVAD equipment check completed and is in good working order.  Back-up equipment present.   LVAD education done on emergency procedures and precautions and reviewed exit site care.  Length of Stay: 5    Amy Clegg NP-C  02/25/2017, 7:34 AM  VAD Team --- VAD ISSUES ONLY--- Pager [phone] (7am - 7am)  Advanced Heart Failure Team  Pager [phone] (M-F; 7a - 4p)  Please contact [facility] for night-coverage after hours (4p -7a ) and weekends on [email or web address]  Patient seen and examined with Darrick Grinder, NP. We discussed all aspects of the encounter. I agree with the assessment and plan as stated above.   EGD and colonoscopy negative. HGB stable. Having low flows related to volume depletion from colon prep. Will have him increase po intake. Can give IVF as needed. Continue heparin gtt and coumadin. Discussed with PharmD.   Glori Bickers, MD  10:48 AM

## 2017-02-25 NOTE — Care Management Important Message (Signed)
Important Message  Patient Details  Name: [name] MRN: [id number] Date of Birth: 1940/12/21   Medicare Important Message Given:  Yes    Orbie Pyo 02/25/2017, 3:27 PM

## 2017-02-25 NOTE — Progress Notes (Signed)
LVAD Coordinator Rounding Note:  Admitted 02/20/17 due to GI bleed. EGD with push enteroscopy negative 4/21. Colonoscopy 4/22 showed diverticulitis of sigmoid colon.  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  Vital signs: HR: 65 Doppler Pressure: 84 Automatic BP: 83/62 (64) O2 Sat: 98% on RA Wt: 89.8 kg/ 197 lbs   LVAD interrogation reveals:  Speed: 8800 Flow:3.4 Power: 4 PI: 6.5 Alarms: none over the weekend. Two low flow alarms early this AM. 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  -GI bleed this admission  Plan/Recommendations:   1. Plan for discharge when INR therapeutic.   Balinda Quails RN, VAD Coordinator 24/7 pager [phone]

## 2017-02-26 LAB — CBC
HCT: 34.1 % — ABNORMAL LOW (ref 39.0–52.0)
HEMOGLOBIN: 10.8 g/dL — AB (ref 13.0–17.0)
MCH: 30.2 pg (ref 26.0–34.0)
MCHC: 31.7 g/dL (ref 30.0–36.0)
MCV: 95.3 fL (ref 78.0–100.0)
PLATELETS: 143 10*3/uL — AB (ref 150–400)
RBC: 3.58 MIL/uL — AB (ref 4.22–5.81)
RDW: 16.4 % — ABNORMAL HIGH (ref 11.5–15.5)
WBC: 5.1 10*3/uL (ref 4.0–10.5)

## 2017-02-26 LAB — LACTATE DEHYDROGENASE: LDH: 255 U/L — AB (ref 98–192)

## 2017-02-26 LAB — HEPARIN LEVEL (UNFRACTIONATED): HEPARIN UNFRACTIONATED: 0.39 [IU]/mL (ref 0.30–0.70)

## 2017-02-26 LAB — PROTIME-INR
INR: 1.62
PROTHROMBIN TIME: 19.4 s — AB (ref 11.4–15.2)

## 2017-02-26 MED ORDER — WARFARIN SODIUM 5 MG PO TABS
5.0000 mg | ORAL_TABLET | Freq: Once | ORAL | Status: AC
  Administered 2017-02-26: 5 mg via ORAL
  Filled 2017-02-26: qty 1

## 2017-02-26 NOTE — Discharge Instructions (Signed)
Information on my medicine - Coumadin®   (Warfarin) ° °This medication education was reviewed with me or my healthcare representative as part of my discharge preparation.   ° °Why was Coumadin prescribed for you? °Coumadin was prescribed for you because you have a blood clot or a medical condition that can cause an increased risk of forming blood clots. Blood clots can cause serious health problems by blocking the flow of blood to the heart, lung, or brain. Coumadin can prevent harmful blood clots from forming. °As a reminder your indication for Coumadin is:   Blood Clot Prevention After Heart Pump Surgery ° °What test will check on my response to Coumadin? °While on Coumadin (warfarin) you will need to have an INR test regularly to ensure that your dose is keeping you in the desired range. The INR (international normalized ratio) number is calculated from the result of the laboratory test called prothrombin time (PT). ° °If an INR APPOINTMENT HAS NOT ALREADY BEEN MADE FOR YOU please schedule an appointment to have this lab work done by your health care provider within 7 days. °Your INR goal is a number between:  2-2.5  ° °What  do you need to  know  About  COUMADIN? °Take Coumadin (warfarin) exactly as prescribed by your healthcare provider about the same time each day.  DO NOT stop taking without talking to the doctor who prescribed the medication.  Stopping without other blood clot prevention medication to take the place of Coumadin may increase your risk of developing a new clot or stroke.  Get refills before you run out. ° °What do you do if you miss a dose? °If you miss a dose, take it as soon as you remember on the same day then continue your regularly scheduled regimen the next day.  Do not take two doses of Coumadin at the same time. ° °Important Safety Information °A possible side effect of Coumadin (Warfarin) is an increased risk of bleeding. You should call your healthcare provider right away if you  experience any of the following: °? Bleeding from an injury or your nose that does not stop. °? Unusual colored urine (red or dark brown) or unusual colored stools (red or black). °? Unusual bruising for unknown reasons. °? A serious fall or if you hit your head (even if there is no bleeding). ° °Some foods or medicines interact with Coumadin® (warfarin) and might alter your response to warfarin. To help avoid this: °? Eat a balanced diet, maintaining a consistent amount of Vitamin K. °? Notify your provider about major diet changes you plan to make. °? Avoid alcohol or limit your intake to 1 drink for women and 2 drinks for men per day. °(1 drink is 5 oz. wine, 12 oz. beer, or 1.5 oz. liquor.) ° °Make sure that ANY health care provider who prescribes medication for you knows that you are taking Coumadin (warfarin).  Also make sure the healthcare provider who is monitoring your Coumadin knows when you have started a new medication including herbals and non-prescription products. ° °Coumadin® (Warfarin)  Major Drug Interactions  °Increased Warfarin Effect Decreased Warfarin Effect  °Alcohol (large quantities) °Antibiotics (esp. Septra/Bactrim, Flagyl, Cipro) °Amiodarone (Cordarone) °Aspirin (ASA) °Cimetidine (Tagamet) °Megestrol (Megace) °NSAIDs (ibuprofen, naproxen, etc.) °Piroxicam (Feldene) °Propafenone (Rythmol SR) °Propranolol (Inderal) °Isoniazid (INH) °Posaconazole (Noxafil) Barbiturates (Phenobarbital) °Carbamazepine (Tegretol) °Chlordiazepoxide (Librium) °Cholestyramine (Questran) °Griseofulvin °Oral Contraceptives °Rifampin °Sucralfate (Carafate) °Vitamin K  ° °Coumadin® (Warfarin) Major Herbal Interactions  °Increased Warfarin Effect Decreased Warfarin Effect  °  Garlic °Ginseng °Ginkgo biloba Coenzyme Q10 °Green tea °St. John’s wort   ° °Coumadin® (Warfarin) FOOD Interactions  °Eat a consistent number of servings per week of foods HIGH in Vitamin K °(1 serving = ½ cup)  °Collards (cooked, or boiled &  drained) °Kale (cooked, or boiled & drained) °Mustard greens (cooked, or boiled & drained) °Parsley *serving size only = ¼ cup °Spinach (cooked, or boiled & drained) °Swiss chard (cooked, or boiled & drained) °Turnip greens (cooked, or boiled & drained)  °Eat a consistent number of servings per week of foods MEDIUM-HIGH in Vitamin K °(1 serving = 1 cup)  °Asparagus (cooked, or boiled & drained) °Broccoli (cooked, boiled & drained, or raw & chopped) °Brussel sprouts (cooked, or boiled & drained) *serving size only = ½ cup °Lettuce, raw (green leaf, endive, romaine) °Spinach, raw °Turnip greens, raw & chopped  ° °These websites have more information on Coumadin (warfarin):  www.coumadin.com; °www.ahrq.gov/consumer/coumadin.htm; ° ° ° °

## 2017-02-26 NOTE — Progress Notes (Signed)
LVAD Coordinator Rounding Note:  Admitted 02/20/17 due to GI bleed. EGD with push enteroscopy negative 4/21. Colonoscopy 4/22 showed diverticulitis of sigmoid colon.  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  -GI bleed this admission  Plan/Recommendations:   1. Plan for discharge when INR therapeutic.    Balinda Quails RN, VAD Coordinator 24/7 pager [phone]

## 2017-02-26 NOTE — Progress Notes (Addendum)
HeartMate 2 Rounding Note  Subjective:    Admitted with GI bleed. Hgb on admit was 10 -->8.7. Received 2 UPRBCs on 4/19    EGD with push enteroscopy on 4/21 was normal.   Colonoscopy 4/22 --> diverticulosis sigmoid colon. No bleeding sites  Resting comfortably. No SOB. No further bleeding. 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   LVAD INTERROGATION:  HeartMate II LVAD:  Flow 3.1 ;iters/min, speed 8800, power 4.1, PI 6.7 no further low flows   Objective:    Vital Signs:   Temp:  [97.8 F (36.6 C)] 97.8 F (36.6 C) (04/23 2035) Pulse Rate:  [67-69] 67 (04/23 2035) Resp:  [18] 18 (04/23 2035) BP: (91)/(74) 91/74 (04/23 0818) SpO2:  [97 %-99 %] 99 % (04/23 2035) Last BM Date: 02/23/17 Mean arterial Pressure 70s  Intake/Output:   Intake/Output Summary (Last 24 hours) at 02/26/17 0503 Last data filed at 02/25/17 1905  Gross per 24 hour  Intake              720 ml  Output                0 ml  Net              720 ml      Physical Exam: Physical Exam: GENERAL: Well appearing, lying in bed  HEENT: normal anicteric NECK: Supple, JVP flat  .  2+ bilaterally, no bruits.  No lymphadenopathy or thyromegaly appreciated.   CARDIAC:  Mechanical heart sounds with LVAD hum present.  LUNGS:  Clear to auscultation bilaterally. No wheeze ABDOMEN:  Soft, round, nontender, nondistended good BS LVAD exit site: well-healed and incorporated.  Dressing dry and intact.  No erythema or drainage.  Stabilization device present and accurately applied.  Driveline dressing is being changed daily per sterile technique. EXTREMITIES:  Warm and dry, no cyanosis, clubbing, rash. No edema or rash  NEUROLOGIC:  Alert and oriented x 4.  Gait steady.  No aphasia.  No dysarthria.  Affect pleasant.       Telemetry: A paced 60s Personally reviewed    Labs: Basic Metabolic  Panel:  Recent Labs Lab 02/21/17 0235 02/22/17 0645 02/23/17 0416 02/24/17 0405 02/25/17 0229  NA 137 138 137 141 139  K 3.8 4.3 4.2 4.3 4.6  CL 108 108 106 109 108  CO2 21* 22 23 24 25   GLUCOSE 107* 103* 108* 98 117*  BUN 44* 29* 25* 18 16  CREATININE 1.38* 1.28* 1.36* 1.29* 1.30*  CALCIUM 8.4* 8.6* 8.7* 8.7* 8.7*    Liver Function Tests:  Recent Labs Lab 02/20/17 2054  AST 30  ALT 23  ALKPHOS 109  BILITOT 0.7  PROT 6.8  ALBUMIN 3.9   No results for input(s): LIPASE, AMYLASE in the last 168 hours. No results for input(s): AMMONIA in the last 168 hours.  CBC:  Recent Labs Lab 02/20/17 2054  02/22/17 0645 02/23/17 0416 02/24/17 0405 02/25/17 0229 02/26/17 0216  WBC 5.2  < > 5.3 5.7 5.1 5.3 5.1  NEUTROABS 2.9  --   --   --   --   --   --   HGB 10.0*  < > 10.8* 11.0* 10.8* 10.6* 10.8*  HCT 30.2*  < > 32.9* 33.3* 34.3* 33.1* 34.1*  MCV 95.3  < > 93.2 94.6 95.3 95.1 95.3  PLT  164  < > 138* 135* 143* 136* 143*  < > = values in this interval not displayed.  INR:  Recent Labs Lab 02/22/17 0645 02/23/17 0416 02/24/17 0405 02/25/17 0229 02/26/17 0216  INR 2.05 1.64 1.57 1.48 1.62    Other results:    Imaging: No results found.   Medications:     Scheduled Medications: . amiodarone  200 mg Oral Daily  . atorvastatin  80 mg Oral q1800  . levothyroxine  200 mcg Oral Once per day on Sun Tue Wed Thu Fri Sat  . levothyroxine  300 mcg Oral Once per day on Mon  . metoprolol succinate  25 mg Oral BID  . pantoprazole  40 mg Oral Daily  . ranolazine  500 mg Oral BID  . Warfarin - Pharmacist Dosing Inpatient   Does not apply q1800    Infusions: . heparin 950 Units/hr (02/25/17 2327)    PRN Medications: acetaminophen, ondansetron (ZOFRAN) IV   Assessment/Plan/Discussion :   1. Symptomatic Anemia--> GI bleed.  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  -Received 2UPRBCS. Hgb stable today.  -EGD with deep push on 4/21  Was negative -S/P  Colonoscopy 4/22 with diverticulosis noted in sigmoid colon. No bleeding.  -Continue coumadin and heparin. Dosing reviewed with PharmD  INR 1.62 -Holding ASA for now  2. 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 Volume status on low side after colon prep but picking up  VAD parameters stable.  3. PAF -- In NSR. Continue amio and coumadin  4. H/O CVA- continue statin. No aspirin with GI bleed.    Home once INR close to 1.8. Probably in am. If rebleeds will need capsule.   I reviewed the LVAD parameters from today, and compared the results to the patient's prior recorded data.  No programming changes were made.  The LVAD is functioning within specified parameters.  The patient performs LVAD self-test daily.  LVAD interrogation was negative for any significant power changes, alarms or PI events/speed drops.  LVAD equipment check completed and is in good working order.  Back-up equipment present.   LVAD education done on emergency procedures and precautions and reviewed exit site care.  Length of Stay: 6    Navya Timmons MD 02/26/2017, 5:03 AM  VAD Team --- VAD ISSUES ONLY--- Pager [phone] (7am - 7am)  Advanced Heart Failure Team  Pager [phone] (M-F; 7a - 4p)  Please contact [facility] for night-coverage after hours (4p -7a ) and weekends on [email or web address]

## 2017-02-26 NOTE — Progress Notes (Signed)
[name] for heparin / warfarin Indication: LVAD  Allergies  Allergen Reactions  . Demerol [Meperidine] Other (See Comments)    Paralysis. Could only move eyes. Paralysis. Could only move eyes.  [name] Opium Other (See Comments)    "CAN'T MOVE" CATATONIC PER PT.    Patient Measurements: Height: 6' (182.9 cm) Weight: 200 lb 11.2 oz (91 kg) IBW/kg (Calculated) : 77.6  Vital Signs: Temp: 98 F (36.7 C) (04/23 2327) Temp Source: Oral (04/23 2327) BP: 102/86 (04/24 0917) Pulse Rate: 73 (04/24 0917)  Labs:  Recent Labs  02/24/17 0405 02/24/17 1841 02/25/17 0229 02/26/17 0216  HGB 10.8*  --  10.6* 10.8*  HCT 34.3*  --  33.1* 34.1*  PLT 143*  --  136* 143*  LABPROT 19.0*  --  18.1* 19.4*  INR 1.57  --  1.48 1.62  HEPARINUNFRC  --  0.31 0.42 0.39  CREATININE 1.29*  --  1.30*  --     Estimated Creatinine Clearance: 52.2 mL/min (A) (by C-G formula based on SCr of 1.3 mg/dL (H)).  Scheduled:  . amiodarone  200 mg Oral Daily  . atorvastatin  80 mg Oral q1800  . levothyroxine  200 mcg Oral Once per day on Sun Tue Wed Thu Fri Sat  . levothyroxine  300 mcg Oral Once per day on Mon  . metoprolol succinate  25 mg Oral BID  . pantoprazole  40 mg Oral Daily  . ranolazine  500 mg Oral BID  . warfarin  5 mg Oral ONCE-1800  . Warfarin - Pharmacist Dosing Inpatient   Does not apply q1800    Assessment: 76yo male admitted w/ GIB, on Coumadin PTA for LVAD. Admit INR 2.38 at goal.  Warfarin  Held for GI work up EGD negative, colonoscopy - diverticulosis -no bleeding  - hold asa/clopidogrel Restart heparin bridge to warfarin  Heparin drip 950 uts/hr HL 0.39 at goal  INR 1.6 repeat boost dose Warfarin 5mg  x1  CBC low stable. No bleed reported. Keep previous INR goal with negative GI workup.   Goal of Therapy:  INR goal 2-2.5 Heparin level 0.3-0.5 units/ml (cautious 2/2 anemia/GIB) Monitor platelets by anticoagulation protocol: Yes   Plan:   Continue heparin at 950 units/hr Warfarin 5mg  x1 tonight  Daily heparin level/CBC/INR Monitor for s/sx bleeding  Bonnita Nasuti Pharm.D. CPP, BCPS Clinical Pharmacist [phone] 02/26/2017 11:05 AM

## 2017-02-27 LAB — BASIC METABOLIC PANEL
ANION GAP: 7 (ref 5–15)
BUN: 22 mg/dL — ABNORMAL HIGH (ref 6–20)
CO2: 24 mmol/L (ref 22–32)
CREATININE: 1.22 mg/dL (ref 0.61–1.24)
Calcium: 8.8 mg/dL — ABNORMAL LOW (ref 8.9–10.3)
Chloride: 105 mmol/L (ref 101–111)
GFR calc non Af Amer: 55 mL/min — ABNORMAL LOW (ref >60)
Glucose, Bld: 97 mg/dL (ref 65–99)
Potassium: 4.1 mmol/L (ref 3.5–5.1)
Sodium: 136 mmol/L (ref 135–145)

## 2017-02-27 LAB — CBC
HEMATOCRIT: 32.3 % — AB (ref 39.0–52.0)
Hemoglobin: 10.8 g/dL — ABNORMAL LOW (ref 13.0–17.0)
MCH: 31.6 pg (ref 26.0–34.0)
MCHC: 33.4 g/dL (ref 30.0–36.0)
MCV: 94.4 fL (ref 78.0–100.0)
Platelets: 137 10*3/uL — ABNORMAL LOW (ref 150–400)
RBC: 3.42 MIL/uL — ABNORMAL LOW (ref 4.22–5.81)
RDW: 16.7 % — AB (ref 11.5–15.5)
WBC: 5.2 10*3/uL (ref 4.0–10.5)

## 2017-02-27 LAB — PROTIME-INR
INR: 1.75
Prothrombin Time: 20.7 seconds — ABNORMAL HIGH (ref 11.4–15.2)

## 2017-02-27 LAB — HEPARIN LEVEL (UNFRACTIONATED): Heparin Unfractionated: 0.22 IU/mL — ABNORMAL LOW (ref 0.30–0.70)

## 2017-02-27 LAB — LACTATE DEHYDROGENASE: LDH: 247 U/L — ABNORMAL HIGH (ref 98–192)

## 2017-02-27 MED ORDER — WARFARIN SODIUM 2 MG PO TABS: ORAL_TABLET | ORAL | 3 refills | Status: DC

## 2017-02-27 MED ORDER — WARFARIN SODIUM 5 MG PO TABS
5.0000 mg | ORAL_TABLET | ORAL | Status: AC
  Administered 2017-02-27: 5 mg via ORAL
  Filled 2017-02-27: qty 1

## 2017-02-27 NOTE — Progress Notes (Signed)
HeartMate 2 Rounding Note  Subjective:    Admitted with GI bleed. Hgb on admit was 10 -->8.7. Received 2 UPRBCs on 4/19    EGD with push enteroscopy on 4/21 was normal.   Colonoscopy 4/22 --> diverticulosis sigmoid colon. No bleeding sites  Wants to go home. No further bleeding. Denies SOB. Low flows improved after drinking lots of water last night.   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   LVAD INTERROGATION:  HeartMate II LVAD:  Flow 3.7 liters/min, speed 8800, power 4, PI 6.6 NO low flows over night.   Objective:    Vital Signs:   Temp:  [97.5 F (36.4 C)-98.1 F (36.7 C)] 98.1 F (36.7 C) (04/25 0337) Pulse Rate:  [60-73] 61 (04/25 0337) Resp:  [18] 18 (04/25 0337) BP: (102)/(86) 102/86 (04/24 0917) SpO2:  [97 %-98 %] 98 % (04/25 0337) Weight:  [200 lb 13.4 oz (91.1 kg)] 200 lb 13.4 oz (91.1 kg) (04/25 0405) Last BM Date: 02/26/17 Mean arterial Pressure 80s  Intake/Output:   Intake/Output Summary (Last 24 hours) at 02/27/17 0739 Last data filed at 02/27/17 0552  Gross per 24 hour  Intake              480 ml  Output              600 ml  Net             -120 ml      Physical Exam: GENERAL: Well appearing, male walking in the room. NAD.  HEENT: normal  NECK: Supple, JVP 5-6  .  2+ bilaterally, no bruits.  No lymphadenopathy or thyromegaly appreciated.   CARDIAC:  Mechanical heart sounds with LVAD hum present.  LUNGS:  Clear to auscultation bilaterally.  ABDOMEN:  Soft, round, nontender, positive bowel sounds x4.     LVAD exit site: well-healed and incorporated.  Dressing dry and intact.  No erythema or drainage.  Stabilization device present and accurately applied.  Driveline dressing is being changed daily per sterile technique. EXTREMITIES:  Warm and dry, no cyanosis, clubbing, rash or edema  NEUROLOGIC:  Alert and oriented x 4.  Gait steady.  No aphasia.  No dysarthria.  Affect pleasant.     Telemetry: A  Paced 60s. Personally reviewed.    Labs: Basic Metabolic Panel:  Recent Labs Lab 02/22/17 0645 02/23/17 0416 02/24/17 0405 02/25/17 0229 02/27/17 0333  NA 138 137 141 139 136  K 4.3 4.2 4.3 4.6 4.1  CL 108 106 109 108 105  CO2 22 23 24 25 24   GLUCOSE 103* 108* 98 117* 97  BUN 29* 25* 18 16 22*  CREATININE 1.28* 1.36* 1.29* 1.30* 1.22  CALCIUM 8.6* 8.7* 8.7* 8.7* 8.8*    Liver Function Tests:  Recent Labs Lab 02/20/17 2054  AST 30  ALT 23  ALKPHOS 109  BILITOT 0.7  PROT 6.8  ALBUMIN 3.9   No results for input(s): LIPASE, AMYLASE in the last 168 hours. No results for input(s): AMMONIA in the last 168 hours.  CBC:  Recent Labs Lab 02/20/17 2054  02/23/17 0416 02/24/17 0405 02/25/17 0229 02/26/17 0216 02/27/17 0333  WBC 5.2  < > 5.7 5.1 5.3 5.1 5.2  NEUTROABS 2.9  --   --   --   --   --   --   HGB 10.0*  < > 11.0* 10.8* 10.6* 10.8* 10.8*  HCT 30.2*  < >  33.3* 34.3* 33.1* 34.1* 32.3*  MCV 95.3  < > 94.6 95.3 95.1 95.3 94.4  PLT 164  < > 135* 143* 136* 143* 137*  < > = values in this interval not displayed.  INR:  Recent Labs Lab 02/23/17 0416 02/24/17 0405 02/25/17 0229 02/26/17 0216 02/27/17 0333  INR 1.64 1.57 1.48 1.62 1.75    Other results:    Imaging: No results found.   Medications:     Scheduled Medications: . amiodarone  200 mg Oral Daily  . atorvastatin  80 mg Oral q1800  . levothyroxine  200 mcg Oral Once per day on Sun Tue Wed Thu Fri Sat  . levothyroxine  300 mcg Oral Once per day on Mon  . metoprolol succinate  25 mg Oral BID  . pantoprazole  40 mg Oral Daily  . ranolazine  500 mg Oral BID  . Warfarin - Pharmacist Dosing Inpatient   Does not apply q1800    Infusions: . heparin 1,000 Units/hr (02/27/17 0615)    PRN Medications: acetaminophen, ondansetron (ZOFRAN) IV   Assessment/Plan/Discussion :   1. Symptomatic Anemia--> GI bleed.  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  -Received 2UPRBCS. Hgb stable  today.  -EGD with deep push on 4/21  Was negative -S/P Colonoscopy 4/22 with diverticulosis noted in sigmoid colon. No bleeding.  -Stop heparin. Coumadin dose per Pharmacy. Hgb 10. 8. Stable.   INR 1.75 -Holding ASA for now  2. 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 Continue current regimen.  VAD parameters stable.  3. PAF -- NSR. Continue coumadin and amio.   4. H/O CVA- continue statin. No aspirin with GI bleed.    Home today. INR 1.75. Discussed wit pharmacy coumadin dose.   I reviewed the LVAD parameters from today, and compared the results to the patient's prior recorded data.  No programming changes were made.  The LVAD is functioning within specified parameters.  The patient performs LVAD self-test daily.  LVAD interrogation was negative for any significant power changes, alarms or PI events/speed drops.  LVAD equipment check completed and is in good working order.  Back-up equipment present.   LVAD education done on emergency procedures and precautions and reviewed exit site care.  Length of Stay: 7    Amy Clegg NP_C    02/27/2017, 7:39 AM  VAD Team --- VAD ISSUES ONLY--- Pager [phone] (7am - 7am)  Advanced Heart Failure Team  Pager [phone] (M-F; 7a - 4p)  Please contact [facility] for night-coverage after hours (4p -7a ) and weekends on [email or web address]  Patient seen and examined with Darrick Grinder, NP. We discussed all aspects of the encounter. I agree with the assessment and plan as stated above.   No bleeding overnight. VAD parameters stable. INR 1.8. [facility] for d/c home today. Coumadin dosing discussed with pharmD.   Glori Bickers, MD  9:19 AM

## 2017-02-27 NOTE — Discharge Summary (Addendum)
Advanced Heart Failure Team  Discharge Summary   Patient ID: [name] MRN: [id number], DOB/AGE: 1941/05/13 76 y.o. Admit date: 02/20/2017 D/C date:     02/27/2017   Primary Discharge Diagnoses:  1. Symptomatic Anemia s/p 2 UPRBC with improvement 2. Upper GI bleed with negative EGD/Colonoscopy 3. 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 PAF 5. h/o CVA 6. CKD, stage III  Hospital Course:   NOLYN SWAB is a 76 y.o. 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 VT implantation complicated by VT, syncope, AF/AFlutter, and RHF requiring short term milrinone.   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   Pt [facility] to [facility] 02/20/17 after presenting with dizziness, weakness, and several low flow alarms.  In ER found to have melena in rectal vault. Hgb 12.7 -> 10.0. Excessive amount of PI events noted on interrogation. Receuved 2UPRBCs with continued downtrend of Hgb. Had large black BM 02/22/17. Coumadin held and GI consulted.   Pt underwent EGD with push enteroscopy 02/23/17 with no obvious source of bleeding and  Colonoscopy 02/24/17 with no obvious source of bleeding or heme positivity. Only abnormality noted was diverticulosis in his sigmoid colon.   Coumadin resumed. Plan is to continue medical management and observation. GI suggests capsule endoscopy in the event of recurrent bleed.  Pt had no further melena after large BM 02/22/17. INR 1.75 on am of discharge. Coumadin dosed accordingly with repeat INR schedule for next week in clinic.  Hgb stable on day of discharge  Hospital course complicated by low flow events due to volume depletion s/p colon prep.  Improved with increased po intake.   Pt will be discharged today in stable condition with close follow up as below. Pt knows to page VAD coordinator and report to ED  with any further symptoms/bleeding.     LVAD Interrogation HM II:  Speed: 8800 Flow: 3.7 PI: 6.6 Power: 4  Back-up speed: 8200  Discharge Weight: 200 lbs Discharge Vitals: Blood pressure 102/86, pulse 61, temperature 98.1 F (36.7 C), temperature source Oral, resp. rate 18, height 6' (1.829 m), weight 200 lb 13.4 oz (91.1 kg), SpO2 98 %.  Labs: Lab Results  Component Value Date   WBC 5.2 02/27/2017   HGB 10.8 (L) 02/27/2017   HCT 32.3 (L) 02/27/2017   MCV 94.4 02/27/2017   PLT 137 (L) 02/27/2017    Recent Labs Lab 02/20/17 2054  02/27/17 0333  NA 134*  < > 136  K 4.2  < > 4.1  CL 104  < > 105  CO2 23  < > 24  BUN 48*  < > 22*  CREATININE 1.50*  < > 1.22  CALCIUM 8.9  < > 8.8*  PROT 6.8  --   --   BILITOT 0.7  --   --   ALKPHOS 109  --   --   ALT 23  --   --   AST 30  --   --   GLUCOSE 121*  < > 97  < > = values in this interval not displayed. Lab Results  Component Value Date   CHOL 106 11/05/2015   HDL 33 (L) 11/05/2015   LDLCALC 60 11/05/2015   TRIG 67 11/05/2015   BNP (last 3 results)  Recent Labs  07/18/16 2253  BNP 1,289.7*    ProBNP (last 3 results) No results for input(s): PROBNP in the last 8760 hours.   Diagnostic Studies/Procedures   No results found.  Discharge Medications   Allergies as of 02/27/2017      Reactions   Demerol [meperidine] Other (See Comments)   Paralysis. Could only move eyes. Paralysis. Could only move eyes.   Opium Other (See Comments)   "CAN'T MOVE" CATATONIC PER PT.      Medication List    TAKE these medications   amiodarone 200 MG tablet Commonly known as:  PACERONE Take 200 mg by mouth daily.   atorvastatin 80 MG tablet Commonly known as:  LIPITOR Take 1 tablet (80 mg total) by mouth daily at 6 PM.   clopidogrel 75 MG tablet Commonly known as:  PLAVIX Take 1 tablet (75 mg total) by mouth daily.   docusate sodium 100 MG capsule Commonly known as:  COLACE Take 100 mg by mouth daily as needed for  mild constipation.   furosemide 20 MG tablet Commonly known as:  LASIX Take 1 tablet (20 mg total) by mouth as needed for edema (weight gain).   ICAPS PO Take 1 tablet by mouth 2 (two) times daily.   levothyroxine 200 MCG tablet Commonly known as:  SYNTHROID, LEVOTHROID TAKE 1 TABLET DAILY BEFORE BREAKFAST WITH 1 & 1/2 TABLETS EVERY MONDAY.   metoprolol succinate 25 MG 24 hr tablet Commonly known as:  TOPROL-XL Take 1 tablet (25 mg total) by mouth 2 (two) times daily.   nitroGLYCERIN 0.4 MG SL tablet Commonly known as:  NITROSTAT Place 0.4 mg under the tongue every 5 (five) minutes as needed for chest pain.   pantoprazole 40 MG tablet Commonly known as:  PROTONIX TAKE 1 TABLET TWICE DAILY   ranolazine 500 MG 12 hr tablet Commonly known as:  RANEXA TAKE 1 TABLET (500 MG TOTAL) BY MOUTH 2 (TWO) TIMES DAILY.   warfarin 2 MG tablet Commonly known as:  COUMADIN Take 2 mg on Mon / Wed / Fri . Take 4 mg on Tues / Thurs/ Sat / Sun Start taking on:  02/28/2017 What changed:  See the new instructions.   zinc gluconate 50 MG tablet Take 50 mg by mouth 2 (two) times daily.       Disposition   The patient will be discharged in stable condition to home. Discharge Instructions    (HEART FAILURE PATIENTS) Call MD:  Anytime you have any of the following symptoms: 1) 3 pound weight gain in 24 hours or 5 pounds in 1 week 2) shortness of breath, with or without a dry hacking cough 3) swelling in the hands, feet or stomach 4) if you have to sleep on extra pillows at night in order to breathe.    Complete by:  As directed    Diet - low sodium heart healthy    Complete by:  As directed    Heart Failure patients record your daily weight using the same scale at the same time of day    Complete by:  As directed    INR  Goal: 1.8 - 2.3    Complete by:  As directed    Goal:  1.8 - 2.3   Start home regimen 02/28/2017   Increase activity slowly    Complete by:  As directed    Page VAD  Coordinator at [phone]  Notify for: any VAD alarms, sustained elevations of power >10 watts, sustained drop in Pulse Index <3  Complete by:  As directed    Notify for:   any VAD alarms sustained elevations of power >10 watts sustained drop in Pulse Index <3     Speed Settings:    Complete by:  As directed    Fixed 8800 RPM     Follow-up Information    Glori Bickers, MD Follow up on 03/06/2017.   Specialty:  Cardiology Why:  at 11:00 Garage Code [address]000 Contact information: [place] [address] [phone]             Duration of Discharge Encounter: Greater than 35 minutes   Signed, Annamaria Helling  02/27/2017, 8:30 AM   Patient seen and examined with the above-signed Advanced Practice Provider and/or Housestaff. I personally reviewed laboratory data, imaging studies and relevant notes. I independently examined the patient and formulated the important aspects of the plan. I have edited the note to reflect any of my changes or salient points. I have personally discussed the plan with the patient and/or family.  No evidence of active bleeding on EGD or colon. Suspect small bowel source. If rebleeds will need capsule endo with possible referral to [facility] for deeper push enteroscopy.   VAD parameters stable.   Glori Bickers, MD  11:26 PM

## 2017-02-27 NOTE — Progress Notes (Signed)
ANTICOAGULATION CONSULT NOTE - Follow Up Consult  Pharmacy Consult for heparin Indication: LVAD  Labs:  Recent Labs  02/25/17 0229 02/26/17 0216 02/27/17 0333  HGB 10.6* 10.8* 10.8*  HCT 33.1* 34.1* 32.3*  PLT 136* 143* 137*  LABPROT 18.1* 19.4* 20.7*  INR 1.48 1.62 1.75  HEPARINUNFRC 0.42 0.39 0.22*  CREATININE 1.30*  --  1.22    Assessment: 76yo male now subtherapeutic on heparin after several levels at low goal.  Goal of Therapy:  Heparin level 0.3-0.5 units/ml   Plan:  Will increase heparin gtt cautiously to 1000 units/hr and check level in [place], PharmD, BCPS  02/27/2017,6:11 AM

## 2017-02-27 NOTE — Care Management Note (Signed)
Case Management Note Marvetta Gibbons RN, BSN Unit 2W-Case Manager [phone]  Patient Details  Name: [name] MRN: [id number] Date of Birth: Mar 31, 1941  Subjective/Objective:  Pt admitted with GIB-  Hx - LVAD, plan for GI workup when INR trends down                Action/Plan: PTA pt lived at home with SO- anticipate return home- CM will follow.   Expected Discharge Date:  02/27/17               Expected Discharge Plan:  Home/Self Care  In-House Referral:     Discharge planning Services  CM Consult  Post Acute Care Choice:  NA Choice offered to:  NA  DME Arranged:    DME Agency:     HH Arranged:    HH Agency:     Status of Service:  Completed, signed off  If discussed at [place] of Stay Meetings, dates discussed:  4/24  Discharge Disposition: home/self care   Additional Comments:  02/27/17- 0945- Marvetta Gibbons RN, CM- pt for d/c home today- no CM needs noted for discharge.   Dawayne Patricia, RN 02/27/2017, 9:48 AM

## 2017-02-27 NOTE — Progress Notes (Signed)
02/27/2017 10:01 AM Discharge AVS meds taken today and those due this evening reviewed.  Follow-up appointments and when to call md reviewed.  D/C IV and TELE.  Questions and concerns addressed.   D/C home per orders. [place]

## 2017-02-27 NOTE — Progress Notes (Signed)
[place] for heparin / warfarin Indication: LVAD  Allergies  Allergen Reactions  . Demerol [Meperidine] Other (See Comments)    Paralysis. Could only move eyes. Paralysis. Could only move eyes.  [name] Opium Other (See Comments)    "CAN'T MOVE" CATATONIC PER PT.    Patient Measurements: Height: 6' (182.9 cm) Weight: 200 lb 13.4 oz (91.1 kg) IBW/kg (Calculated) : 77.6  Vital Signs: Temp: 98.1 F (36.7 C) (04/25 0337) Temp Source: Oral (04/25 0337) Pulse Rate: 61 (04/25 0337)  Labs:  Recent Labs  02/25/17 0229 02/26/17 0216 02/27/17 0333  HGB 10.6* 10.8* 10.8*  HCT 33.1* 34.1* 32.3*  PLT 136* 143* 137*  LABPROT 18.1* 19.4* 20.7*  INR 1.48 1.62 1.75  HEPARINUNFRC 0.42 0.39 0.22*  CREATININE 1.30*  --  1.22    Estimated Creatinine Clearance: 55.7 mL/min (by C-G formula based on SCr of 1.22 mg/dL).  Scheduled:  . amiodarone  200 mg Oral Daily  . atorvastatin  80 mg Oral q1800  . levothyroxine  200 mcg Oral Once per day on Sun Tue Wed Thu Fri Sat  . levothyroxine  300 mcg Oral Once per day on Mon  . metoprolol succinate  25 mg Oral BID  . pantoprazole  40 mg Oral Daily  . ranolazine  500 mg Oral BID  . Warfarin - Pharmacist Dosing Inpatient   Does not apply q1800    Assessment: 76yo male admitted w/ GIB, on Coumadin PTA for LVAD. Admit INR 2.38 at goal.  Warfarin  Held for GI work up EGD negative, colonoscopy - diverticulosis -no bleeding  - hold asa/clopidogrel Restart heparin bridge to warfarin  Heparin drip 950 uts/hr HL 0.22 this am <  goal - but stop and d/c home INR 1.75 slowly rising with increase doses  Warfarin 5mg  x1 prior to DC today and restart home dose 4/26 CBC low stable. No bleed reported. Keep previous INR goal with negative GI workup.  PTA Warfarin 4mg  TTSS/2mg  MWF  Goal of Therapy:  INR goal 2-2.5 Heparin level 0.3-0.5 units/ml (cautious 2/2 anemia/GIB) Monitor platelets by anticoagulation protocol:  Yes   Plan:  Stop heparin Warfarin 5mg  x1 today prior to dc Then restart PTA dose 4mg  TTSS/2mg  MWF Check INR next week  Bonnita Nasuti Pharm.D. CPP, BCPS Clinical Pharmacist [phone] 02/27/2017 7:51 AM

## 2017-02-28 ENCOUNTER — Encounter (HOSPITAL_COMMUNITY): Payer: Self-pay | Admitting: Gastroenterology

## 2017-03-01 ENCOUNTER — Ambulatory Visit (HOSPITAL_COMMUNITY): Payer: Self-pay | Admitting: Pharmacist

## 2017-03-01 LAB — POCT INR: INR: 2.9

## 2017-03-06 ENCOUNTER — Ambulatory Visit (HOSPITAL_COMMUNITY)
Admit: 2017-03-06 | Discharge: 2017-03-06 | Disposition: A | Discharge status: Home / Self Care | Payer: Medicare Other | Attending: Internal Medicine | Admitting: Internal Medicine

## 2017-03-06 ENCOUNTER — Encounter (HOSPITAL_COMMUNITY): Payer: Self-pay

## 2017-03-06 ENCOUNTER — Ambulatory Visit (HOSPITAL_COMMUNITY): Payer: Self-pay | Admitting: Pharmacist

## 2017-03-06 VITALS — BP 82/56 | Resp 16 | Ht 72.0 in | Wt 201.2 lb

## 2017-03-06 DIAGNOSIS — Z85820 Personal history of malignant melanoma of skin: Secondary | ICD-10-CM | POA: Diagnosis not present

## 2017-03-06 DIAGNOSIS — I252 Old myocardial infarction: Secondary | ICD-10-CM | POA: Insufficient documentation

## 2017-03-06 DIAGNOSIS — I5032 Chronic diastolic (congestive) heart failure: Secondary | ICD-10-CM | POA: Diagnosis not present

## 2017-03-06 DIAGNOSIS — E039 Hypothyroidism, unspecified: Secondary | ICD-10-CM | POA: Insufficient documentation

## 2017-03-06 DIAGNOSIS — M4856XA Collapsed vertebra, not elsewhere classified, lumbar region, initial encounter for fracture: Secondary | ICD-10-CM | POA: Insufficient documentation

## 2017-03-06 DIAGNOSIS — E785 Hyperlipidemia, unspecified: Secondary | ICD-10-CM | POA: Diagnosis not present

## 2017-03-06 DIAGNOSIS — G4730 Sleep apnea, unspecified: Secondary | ICD-10-CM | POA: Insufficient documentation

## 2017-03-06 DIAGNOSIS — I5022 Chronic systolic (congestive) heart failure: Secondary | ICD-10-CM | POA: Insufficient documentation

## 2017-03-06 DIAGNOSIS — Z95810 Presence of automatic (implantable) cardiac defibrillator: Secondary | ICD-10-CM | POA: Diagnosis not present

## 2017-03-06 DIAGNOSIS — Z7901 Long term (current) use of anticoagulants: Secondary | ICD-10-CM | POA: Insufficient documentation

## 2017-03-06 DIAGNOSIS — I130 Hypertensive heart and chronic kidney disease with heart failure and stage 1 through stage 4 chronic kidney disease, or unspecified chronic kidney disease: Secondary | ICD-10-CM | POA: Diagnosis not present

## 2017-03-06 DIAGNOSIS — Z95811 Presence of heart assist device: Secondary | ICD-10-CM | POA: Diagnosis not present

## 2017-03-06 DIAGNOSIS — I739 Peripheral vascular disease, unspecified: Secondary | ICD-10-CM | POA: Insufficient documentation

## 2017-03-06 DIAGNOSIS — Z9889 Other specified postprocedural states: Secondary | ICD-10-CM | POA: Insufficient documentation

## 2017-03-06 DIAGNOSIS — I2510 Atherosclerotic heart disease of native coronary artery without angina pectoris: Secondary | ICD-10-CM | POA: Insufficient documentation

## 2017-03-06 DIAGNOSIS — I255 Ischemic cardiomyopathy: Secondary | ICD-10-CM | POA: Diagnosis not present

## 2017-03-06 DIAGNOSIS — Z8673 Personal history of transient ischemic attack (TIA), and cerebral infarction without residual deficits: Secondary | ICD-10-CM | POA: Diagnosis not present

## 2017-03-06 DIAGNOSIS — I472 Ventricular tachycardia, unspecified: Secondary | ICD-10-CM

## 2017-03-06 DIAGNOSIS — Z7902 Long term (current) use of antithrombotics/antiplatelets: Secondary | ICD-10-CM | POA: Diagnosis not present

## 2017-03-06 DIAGNOSIS — I480 Paroxysmal atrial fibrillation: Secondary | ICD-10-CM | POA: Insufficient documentation

## 2017-03-06 DIAGNOSIS — I471 Supraventricular tachycardia: Secondary | ICD-10-CM | POA: Diagnosis not present

## 2017-03-06 DIAGNOSIS — Z981 Arthrodesis status: Secondary | ICD-10-CM | POA: Diagnosis not present

## 2017-03-06 DIAGNOSIS — N183 Chronic kidney disease, stage 3 (moderate): Secondary | ICD-10-CM | POA: Insufficient documentation

## 2017-03-06 DIAGNOSIS — K922 Gastrointestinal hemorrhage, unspecified: Secondary | ICD-10-CM

## 2017-03-06 DIAGNOSIS — I714 Abdominal aortic aneurysm, without rupture: Secondary | ICD-10-CM | POA: Insufficient documentation

## 2017-03-06 LAB — BASIC METABOLIC PANEL
ANION GAP: 9 (ref 5–15)
BUN: 34 mg/dL — ABNORMAL HIGH (ref 6–20)
CALCIUM: 9.4 mg/dL (ref 8.9–10.3)
CO2: 24 mmol/L (ref 22–32)
CREATININE: 1.55 mg/dL — AB (ref 0.61–1.24)
Chloride: 104 mmol/L (ref 101–111)
GFR, EST AFRICAN AMERICAN: 48 mL/min — AB (ref >60)
GFR, EST NON AFRICAN AMERICAN: 41 mL/min — AB (ref >60)
Glucose, Bld: 96 mg/dL (ref 65–99)
Potassium: 4.7 mmol/L (ref 3.5–5.1)
SODIUM: 137 mmol/L (ref 135–145)

## 2017-03-06 LAB — POCT INR: INR: 1.7

## 2017-03-06 LAB — CBC
HCT: 35.5 % — ABNORMAL LOW (ref 39.0–52.0)
HEMOGLOBIN: 11.5 g/dL — AB (ref 13.0–17.0)
MCH: 30.9 pg (ref 26.0–34.0)
MCHC: 32.4 g/dL (ref 30.0–36.0)
MCV: 95.4 fL (ref 78.0–100.0)
Platelets: 155 10*3/uL (ref 150–400)
RBC: 3.72 MIL/uL — ABNORMAL LOW (ref 4.22–5.81)
RDW: 16.8 % — ABNORMAL HIGH (ref 11.5–15.5)
WBC: 6 10*3/uL (ref 4.0–10.5)

## 2017-03-06 LAB — PROTIME-INR
INR: 1.77
Prothrombin Time: 20.9 seconds — ABNORMAL HIGH (ref 11.4–15.2)

## 2017-03-06 LAB — LACTATE DEHYDROGENASE: LDH: 258 U/L — AB (ref 98–192)

## 2017-03-06 NOTE — Progress Notes (Signed)
Patient ID: [name], male   DOB: [birth year], 76 y.o.   MRN: [id number]   VAD CLINIC PROGRESS NOTE  PCP: Dr. Kandice [name] ([place]) GU: Dr [name]  Neuro Surgeon: Dr [name] Vascular: Dr [name] Primary HF: Dr [name]  [name] is a 76 yo male with h/o VT, PAD and severe systolic HF (EF 40-98%) due to mixed ischemic/nonischemic CM he is s/p HM II LVAD implant for destination therapy on October 19, 2013.VAD implantation was complicated by VT, syncope, AF/Aflutter and RHF. Required short term milrinone.    GU Event 09/01/14 had impacted ureteral stone and underwent cystoscopy and flexible ureteroscopy with lithotripsy and basket extraction.   09/2015 Hematuria and chronic nephrolithiasis. Renal US showed mild hyronephrosis and bilateral renal cysts. Dr. Risa [name] took back for repeat lithotripsy and stone extraction by ureteroscopy and J stent placement.  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  4/2-18 - Episode of melena. Hgb 10.9-> 8.7. 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  01/2016 R CEA. He was started on Plavix.  01/2016 Lumbar fusion L3-L5 with pedicle screws posterolateral arthrodesis with BMP, allograft  Admitted 9/17 with high fevers and productive cough. CXR negative. Treated with levaquin x 7 days for acute bronchitis. Also found to have VT and underwent DC-CV. Seen by EP and amio increased  Had ramp echo in 10/17 speed turned down to 8800 due to RV failure.  Admitted 4/18 with melena and drop in Hgb. Transfused 2u. EGD/colon negative.  Follow up LVAD Clinic: Presents for post hospital f/u. Doing well. No further bleeding. No melena/BRBPR. No dizziness or orthostasis. Feels good. Doing all activities without too much difficulty. Denies SOB/CP or edema. Takes lasix rarely. Taking all medications as prescribed. No palpitations. No neuro symptoms or dark urine.   VAD Indication: DT -  age excluding  VAD interrogation & Equipment Management: Speed:8800 Flow: 3.1 Power:4.1 w PI:6.0  Alarms: no clinical alarms Events: 10-20 PI events daily  Fixed speed 8800 Low speed limit: 8200    LVAD interrogated and functioning as expected    Past Medical History:  Diagnosis Date  . Arthritis   . Automatic implantable cardioverter-defibrillator in situ    a. s/p Medtronic Evera device serial [id number]    . Bradycardia   . CAD (coronary artery disease)    a. s/p MI 1982;  s/p DES to CFX 2011;  s/p NSTEMI 4/12 in setting of VTach;  cath 7/12:  LM ok, LAD mid 30-40%, Dx's with 40%, mCFX stent patent, prox to mid RCA occluded with R to R and L to R collats.  Medical Tx was continued  . Chronic systolic heart failure ([place])    a. s/p LVAD 10/2013 for destination therapy  . CKD (chronic kidney disease)   . CVD (cerebrovascular disease)    a. s/p L CEA 2008  . Cytopenia   . Dysrhythmia    AFIB  . History of GI bleed    a. on ASA- started on plavix during 10/2015 admission for CVA  . HLD (hyperlipidemia)   . HTN (hypertension)   . Hypothyroidism   . Inappropriate therapy from implantable cardioverter-defibrillator    a. ATP for sinus tach SVT wavelet identified SVT but was no  passive (2014)  . Ischemic cardiomyopathy   . 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 L Ear   . Myocardial infarction ([place])    1992  .  PAF (paroxysmal atrial fibrillation) ([place])   . PVD (peripheral vascular disease) ([place])    a. 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 Shortness of breath dyspnea    W/ EXERTION   . Sleep apnea    NO CPAP NOW  HE SENT IT BACK YRS AGO  . Stroke [facility])    a. 10/2015 admission   . Ventricular tachycardia ([place])    a. s/p AICD;   h/o ICD shock;  Amiodarone Rx. S/P ablation in 2012    Current Outpatient Prescriptions  Medication Sig Dispense Refill  . amiodarone (PACERONE)  200 MG tablet Take 200 mg by mouth daily.    [name] atorvastatin (LIPITOR) 80 MG tablet Take 1 tablet (80 mg total) by mouth daily at 6 PM. 30 tablet 6  . clopidogrel (PLAVIX) 75 MG tablet Take 1 tablet (75 mg total) by mouth daily. 90 tablet 3  . docusate sodium (COLACE) 100 MG capsule Take 100 mg by mouth daily as needed for mild constipation.     . furosemide (LASIX) 20 MG tablet Take 1 tablet (20 mg total) by mouth as needed for edema (weight gain). 30 tablet 3  . levothyroxine (SYNTHROID, LEVOTHROID) 200 MCG tablet TAKE 1 TABLET DAILY BEFORE BREAKFAST WITH 1 & 1/2 TABLETS EVERY MONDAY. 45 tablet 6  . metoprolol succinate (TOPROL-XL) 25 MG 24 hr tablet Take 1 tablet (25 mg total) by mouth 2 (two) times daily. 180 tablet 3  . Multiple Vitamins-Minerals (ICAPS PO) Take 1 tablet by mouth 2 (two) times daily.    . nitroGLYCERIN (NITROSTAT) 0.4 MG SL tablet Place 0.4 mg under the tongue every 5 (five) minutes as needed for chest pain.    . pantoprazole (PROTONIX) 40 MG tablet TAKE 1 TABLET TWICE DAILY 60 tablet 3  . ranolazine (RANEXA) 500 MG 12 hr tablet TAKE 1 TABLET (500 MG TOTAL) BY MOUTH 2 (TWO) TIMES DAILY. 60 tablet 6  . warfarin (COUMADIN) 2 MG tablet Take 2 mg on Mon / Wed / Fri . Take 4 mg on Tues / Thurs/ Sat / Sun 50 tablet 3  . zinc gluconate 50 MG tablet Take 50 mg by mouth 2 (two) times daily.      No current facility-administered medications for this encounter.     Demerol [meperidine] and Opium  REVIEW OF SYSTEMS: All systems negative except as listed in HPI, PMH and Problem list.  Vital Signs:  Doppler Pressure 105              Automatc BP: 82/56 (68) HR:68  SPO2:97  %  Weight: 201.2 lb w/o eqt Last weight: 200 lb Home weights: 200 lbs  Physical Exam: GENERAL: NAD. Here with [name] Ambulated in the clinic without difficulty  HEENT: normal anicteric NECK: Supple, JVP 6-7  no carotid bruits. R CEA scar well-healed No lymphadenopathy or thyromegaly appreciated.     CARDIAC:  Mechanical heart sounds. LVAD hum present Normal pump sounds LUNGS:  Clear. No wheeze ABDOMEN:  Soft, round, nontender, non distended. Good BS LVAD exit site: well-healed and incorporated.  Dressing C/D/I  No erythema or drainage.  Stabilization device present and accurately applied.  Driveline dressing is being changed weekly per sterile technique. Site looks good. No signs infection EXTREMITIES:  No c/e/e Warm NEUROLOGIC:  Alert and oriented x 4.  Gait steady.  Cranial nerves intact.  Grossly normal bilateral upper and lower extremity strength.   ASSESSMENT AND PLAN:  1. Chronic systolic HF: s/p HM-II LVAD 10/2014.  Remains NYHA II.   - Doing well.  NYHA II. Volume status looks good - LVAD speed reduced to 8800 in 10/17 due to RV failure. VAD parameters stable with occasional --- on flows but no low flow alarms -. Volume status ok. Continue prn lasix 2.  Atrial arrhythmias: Atrial fibrillation, atrial tachycardia.  S/p DC-CV 06/04/14, DC-CV 03/02/15 and 01/19/16.   -Has been in NSR. Continue amio to 200 mg daily.  Watch for recurrent thyroid issues. -With recent VT we have been hesitant to drop dose again 3. HTN:  --MAP  Well controlled. Continue current regimen 4. LVAD:  --LVAD parameters reviewed personally. Occasional --- but low flow alarms have resolved with reduce speed  5. CKD, stage III:   --Stable. Creatinine 1.55 today 6. Anticoagulation management:  --Goal INR 2-2.5  He is off ASA with GI bleeding. Continue Plavix.  --Uses home INR monitor. INR 1.77 today. I personally discussed with PharmD today 7. H/o GIB:  --s/p clipping of 2 jejunal AVMs in 4/16. Continue coumadin and plavix.(with h/o CVA). --Admitted 4/18 with recurrent GIB. Transfused 2u. EGD/colon negative --HGb up to 11.5 today. No current bleeding. Continue PPI      8. Lumbar compression fracture:  s/p L3-L5 fusion 02/03/16. --back pain resolved 9. 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   Continue atorvastatin. (also warfarin INR goal 2-2.5). S/p R CEA 01/13/16. Continue  plavix.  11. H/o amio-induced hypethyroidism:  --Followed by Dr. Forde Dandy in past.  --Amio restarted peri-operatively at low dose due to recurrent atrial arrhythmias and VT.  --Recent TSH ok.  --Continue to follow 12. VT  - underwent DC-CV 9/17. Back on amio. Remains quiescent.   Nylan Nevel,MD 11:21 PM

## 2017-03-06 NOTE — Progress Notes (Signed)
Patient presents for hospital d/c  follow up in [facility] today. Reports no problems with VAD equipment or concerns with drive line.  Vital Signs:  Doppler Pressure 105   Automatc BP: 82/56 (68) HR:68   SPO2:97  %  Weight: 201.2 lb w/o eqt Last weight: 200 lb Home weights: 200 lbs   VAD Indication: DT - age excluding  VAD interrogation & Equipment Management: Speed:8800 Flow: 3.1 Power:4.1 w    PI:6.0  Alarms: no clinical alarms Events: 10-20 PI events daily  Fixed speed 8800 Low speed limit: 8200  Primary Controller:  Replace back up battery in 64months. Back up controller:   Replace back up battery in 16 months.  Annual Equipment Maintenance on UBC/PM was performed on 10/2016.   I reviewed the LVAD parameters from today and compared the results to the patient's prior recorded data. LVAD interrogation was NEGATIVE for significant power changes, NEGATIVE for clinical alarms and STABLE for PI events/speed drops. No programming changes were made and pump is functioning within specified parameters. Pt is performing daily controller and system monitor self tests along with completing weekly and monthly maintenance for LVAD equipment.  LVAD equipment check completed and is in good working order. Back-up equipment present. Charged back up battery and performed self-test on equipment.   Exit Site Care: Drive line is being maintained weekly  by [name]. Drive line exit site well healed and incorporated. The velour is fully implanted at exit site. Dressing dry and intact. No erythema or drainage. Stabilization device present and accurately applied. Pt denies fever or chills. Pt states they have adequate dressing supplies at home.   Significant Events on VAD Support:  02/2015 >> GIB (removed ASA, 2-2.5 INR) 10/2015 >> CVA (Plavix started) 02/2017> GIB (removed ASA, scopes negative)  Device:Medtronic Therapies: on Last check: unknown   BP & Labs:  MAP105 - Doppler is  reflecting modofied systolic  Hgb 15.1 - No S/S of bleeding. Specifically denies melena/BRBPR or nosebleeds.  LDH stable at 258 with established baseline of 280. Denies tea-colored urine. No power elevations noted on interrogation.   Plan:  1. Please do 90 day refills in the future 2. Return to clinic in 2 months   Balinda Quails RN [place] Coordinator   Office: [phone] 24/7 Emergency [place] Pager: [phone]

## 2017-03-12 ENCOUNTER — Other Ambulatory Visit (HOSPITAL_COMMUNITY): Payer: Self-pay | Admitting: Student

## 2017-03-12 DIAGNOSIS — I471 Supraventricular tachycardia: Secondary | ICD-10-CM

## 2017-03-12 DIAGNOSIS — I4892 Unspecified atrial flutter: Secondary | ICD-10-CM

## 2017-03-13 ENCOUNTER — Other Ambulatory Visit (HOSPITAL_COMMUNITY): Payer: Self-pay | Admitting: Cardiology

## 2017-03-13 ENCOUNTER — Ambulatory Visit (HOSPITAL_COMMUNITY): Payer: Self-pay | Admitting: Pharmacist

## 2017-03-13 DIAGNOSIS — I471 Supraventricular tachycardia: Secondary | ICD-10-CM

## 2017-03-13 DIAGNOSIS — I4892 Unspecified atrial flutter: Secondary | ICD-10-CM

## 2017-03-13 LAB — POCT INR: INR: 1.8

## 2017-03-13 MED ORDER — AMIODARONE HCL 200 MG PO TABS: 200.0000 mg | ORAL_TABLET | Freq: Every day | ORAL | 3 refills | Status: DC

## 2017-03-19 ENCOUNTER — Ambulatory Visit (HOSPITAL_COMMUNITY): Payer: Self-pay | Admitting: Pharmacist

## 2017-03-19 LAB — POCT INR: INR: 2.8

## 2017-03-25 ENCOUNTER — Encounter (HOSPITAL_COMMUNITY): Payer: Medicare Other

## 2017-03-26 ENCOUNTER — Encounter (HOSPITAL_COMMUNITY): Payer: Self-pay | Admitting: *Deleted

## 2017-03-26 ENCOUNTER — Ambulatory Visit (HOSPITAL_COMMUNITY)
Admission: RE | Admit: 2017-03-26 | Discharge: 2017-03-26 | Disposition: A | Discharge status: Home / Self Care | Payer: Medicare Other | Source: Ambulatory Visit | Attending: Internal Medicine | Admitting: Internal Medicine

## 2017-03-26 ENCOUNTER — Inpatient Hospital Stay (HOSPITAL_COMMUNITY)
Admission: AD | Admit: 2017-03-26 | Discharge: 2017-03-29 | DRG: 315 | Disposition: A | Discharge status: Home / Self Care | Payer: Medicare Other | Source: Ambulatory Visit | Attending: Internal Medicine | Admitting: Internal Medicine

## 2017-03-26 ENCOUNTER — Ambulatory Visit (HOSPITAL_COMMUNITY): Payer: Self-pay | Admitting: Pharmacist

## 2017-03-26 ENCOUNTER — Observation Stay (HOSPITAL_COMMUNITY): Payer: Medicare Other

## 2017-03-26 ENCOUNTER — Ambulatory Visit (HOSPITAL_COMMUNITY)
Admission: RE | Admit: 2017-03-26 | Discharge: 2017-03-26 | Disposition: A | Discharge status: Home / Self Care | Payer: Medicare Other | Source: Ambulatory Visit | Attending: Adult Health | Admitting: Adult Health

## 2017-03-26 DIAGNOSIS — Z95811 Presence of heart assist device: Secondary | ICD-10-CM

## 2017-03-26 DIAGNOSIS — I130 Hypertensive heart and chronic kidney disease with heart failure and stage 1 through stage 4 chronic kidney disease, or unspecified chronic kidney disease: Secondary | ICD-10-CM | POA: Diagnosis not present

## 2017-03-26 DIAGNOSIS — Z7901 Long term (current) use of anticoagulants: Secondary | ICD-10-CM

## 2017-03-26 DIAGNOSIS — R0600 Dyspnea, unspecified: Secondary | ICD-10-CM

## 2017-03-26 DIAGNOSIS — T829XXA Unspecified complication of cardiac and vascular prosthetic device, implant and graft, initial encounter: Principal | ICD-10-CM | POA: Diagnosis present

## 2017-03-26 DIAGNOSIS — I959 Hypotension, unspecified: Secondary | ICD-10-CM | POA: Diagnosis not present

## 2017-03-26 DIAGNOSIS — I252 Old myocardial infarction: Secondary | ICD-10-CM

## 2017-03-26 DIAGNOSIS — Z79899 Other long term (current) drug therapy: Secondary | ICD-10-CM

## 2017-03-26 DIAGNOSIS — I255 Ischemic cardiomyopathy: Secondary | ICD-10-CM | POA: Diagnosis present

## 2017-03-26 DIAGNOSIS — I480 Paroxysmal atrial fibrillation: Secondary | ICD-10-CM | POA: Diagnosis present

## 2017-03-26 DIAGNOSIS — Z8673 Personal history of transient ischemic attack (TIA), and cerebral infarction without residual deficits: Secondary | ICD-10-CM

## 2017-03-26 DIAGNOSIS — I2510 Atherosclerotic heart disease of native coronary artery without angina pectoris: Secondary | ICD-10-CM | POA: Diagnosis present

## 2017-03-26 DIAGNOSIS — E785 Hyperlipidemia, unspecified: Secondary | ICD-10-CM | POA: Diagnosis present

## 2017-03-26 DIAGNOSIS — J811 Chronic pulmonary edema: Secondary | ICD-10-CM

## 2017-03-26 DIAGNOSIS — I5022 Chronic systolic (congestive) heart failure: Secondary | ICD-10-CM

## 2017-03-26 DIAGNOSIS — Z955 Presence of coronary angioplasty implant and graft: Secondary | ICD-10-CM

## 2017-03-26 DIAGNOSIS — E0590 Thyrotoxicosis, unspecified without thyrotoxic crisis or storm: Secondary | ICD-10-CM | POA: Diagnosis present

## 2017-03-26 DIAGNOSIS — Z85820 Personal history of malignant melanoma of skin: Secondary | ICD-10-CM

## 2017-03-26 DIAGNOSIS — Y831 Surgical operation with implant of artificial internal device as the cause of abnormal reaction of the patient, or of later complication, without mention of misadventure at the time of the procedure: Secondary | ICD-10-CM | POA: Diagnosis present

## 2017-03-26 LAB — CBC
HEMATOCRIT: 35.1 % — AB (ref 39.0–52.0)
HEMOGLOBIN: 11.2 g/dL — AB (ref 13.0–17.0)
MCH: 30.4 pg (ref 26.0–34.0)
MCHC: 31.9 g/dL (ref 30.0–36.0)
MCV: 95.1 fL (ref 78.0–100.0)
Platelets: 151 10*3/uL (ref 150–400)
RBC: 3.69 MIL/uL — AB (ref 4.22–5.81)
RDW: 15.7 % — ABNORMAL HIGH (ref 11.5–15.5)
WBC: 6.7 10*3/uL (ref 4.0–10.5)

## 2017-03-26 LAB — COMPREHENSIVE METABOLIC PANEL
ALBUMIN: 4 g/dL (ref 3.5–5.0)
ALT: 24 U/L (ref 17–63)
ANION GAP: 7 (ref 5–15)
AST: 29 U/L (ref 15–41)
Alkaline Phosphatase: 95 U/L (ref 38–126)
BILIRUBIN TOTAL: 1 mg/dL (ref 0.3–1.2)
BUN: 33 mg/dL — ABNORMAL HIGH (ref 6–20)
CO2: 22 mmol/L (ref 22–32)
Calcium: 9.1 mg/dL (ref 8.9–10.3)
Chloride: 105 mmol/L (ref 101–111)
Creatinine, Ser: 1.46 mg/dL — ABNORMAL HIGH (ref 0.61–1.24)
GFR calc Af Amer: 52 mL/min — ABNORMAL LOW (ref >60)
GFR calc non Af Amer: 45 mL/min — ABNORMAL LOW (ref >60)
GLUCOSE: 104 mg/dL — AB (ref 65–99)
POTASSIUM: 4.9 mmol/L (ref 3.5–5.1)
Sodium: 134 mmol/L — ABNORMAL LOW (ref 135–145)
TOTAL PROTEIN: 6.9 g/dL (ref 6.5–8.1)

## 2017-03-26 LAB — POCT INR: INR: 2

## 2017-03-26 LAB — MRSA PCR SCREENING: MRSA by PCR: NEGATIVE

## 2017-03-26 LAB — PROTIME-INR
INR: 1.91
Prothrombin Time: 22.1 seconds — ABNORMAL HIGH (ref 11.4–15.2)

## 2017-03-26 LAB — LACTATE DEHYDROGENASE: LDH: 270 U/L — AB (ref 98–192)

## 2017-03-26 LAB — HEPARIN LEVEL (UNFRACTIONATED): HEPARIN UNFRACTIONATED: 0.18 [IU]/mL — AB (ref 0.30–0.70)

## 2017-03-26 MED ORDER — SODIUM CHLORIDE 0.9% FLUSH
10.0000 mL | Freq: Two times a day (BID) | INTRAVENOUS | Status: DC
  Administered 2017-03-26 – 2017-03-27 (×3): 10 mL
  Administered 2017-03-28: 20 mL
  Administered 2017-03-28: 10 mL

## 2017-03-26 MED ORDER — DOCUSATE SODIUM 100 MG PO CAPS: 100.0000 mg | ORAL_CAPSULE | Freq: Every day | ORAL | Status: DC | PRN

## 2017-03-26 MED ORDER — RANOLAZINE ER 500 MG PO TB12
500.0000 mg | ORAL_TABLET | Freq: Two times a day (BID) | ORAL | Status: DC
  Administered 2017-03-26 – 2017-03-29 (×6): 500 mg via ORAL
  Filled 2017-03-26 (×6): qty 1

## 2017-03-26 MED ORDER — CHLORHEXIDINE GLUCONATE CLOTH 2 % EX PADS
6.0000 | MEDICATED_PAD | Freq: Every day | CUTANEOUS | Status: DC
  Administered 2017-03-26 – 2017-03-29 (×4): 6 via TOPICAL

## 2017-03-26 MED ORDER — ACETAMINOPHEN 325 MG PO TABS: 650.0000 mg | ORAL_TABLET | ORAL | Status: DC | PRN

## 2017-03-26 MED ORDER — ATORVASTATIN CALCIUM 80 MG PO TABS
80.0000 mg | ORAL_TABLET | Freq: Every day | ORAL | Status: DC
  Administered 2017-03-27 – 2017-03-28 (×2): 80 mg via ORAL
  Filled 2017-03-26 (×2): qty 1

## 2017-03-26 MED ORDER — SODIUM CHLORIDE 0.9% FLUSH: 3.0000 mL | INTRAVENOUS | Status: DC | PRN

## 2017-03-26 MED ORDER — METOPROLOL SUCCINATE ER 25 MG PO TB24
25.0000 mg | ORAL_TABLET | Freq: Two times a day (BID) | ORAL | Status: DC
  Administered 2017-03-26 – 2017-03-27 (×3): 25 mg via ORAL
  Filled 2017-03-26 (×4): qty 1

## 2017-03-26 MED ORDER — LEVOTHYROXINE SODIUM 100 MCG PO TABS: 200.0000 ug | ORAL_TABLET | ORAL | Status: DC

## 2017-03-26 MED ORDER — SODIUM CHLORIDE 0.9% FLUSH
3.0000 mL | Freq: Two times a day (BID) | INTRAVENOUS | Status: DC
  Administered 2017-03-26 – 2017-03-29 (×5): 3 mL via INTRAVENOUS

## 2017-03-26 MED ORDER — SODIUM CHLORIDE 0.9 % IV SOLN
250.0000 mL | INTRAVENOUS | Status: DC | PRN
Start: 2017-03-26 — End: 2017-03-29
  Administered 2017-03-27: 10 mL/h via INTRAVENOUS
  Administered 2017-03-28: 250 mL via INTRAVENOUS

## 2017-03-26 MED ORDER — PANTOPRAZOLE SODIUM 40 MG PO TBEC
40.0000 mg | DELAYED_RELEASE_TABLET | Freq: Two times a day (BID) | ORAL | Status: DC
  Administered 2017-03-26 – 2017-03-29 (×6): 40 mg via ORAL
  Filled 2017-03-26 (×6): qty 1

## 2017-03-26 MED ORDER — HEPARIN (PORCINE) IN NACL 100-0.45 UNIT/ML-% IJ SOLN
1000.0000 [IU]/h | INTRAMUSCULAR | Status: DC
  Administered 2017-03-26 – 2017-03-27 (×2): 950 [IU]/h via INTRAVENOUS
  Administered 2017-03-28: 1000 [IU]/h via INTRAVENOUS
  Filled 2017-03-26 (×3): qty 250

## 2017-03-26 MED ORDER — ONDANSETRON HCL 4 MG/2ML IJ SOLN
4.0000 mg | Freq: Four times a day (QID) | INTRAMUSCULAR | Status: DC | PRN
  Administered 2017-03-27: 4 mg via INTRAVENOUS
  Filled 2017-03-26: qty 2

## 2017-03-26 MED ORDER — SODIUM CHLORIDE 0.9% FLUSH: 10.0000 mL | INTRAVENOUS | Status: DC | PRN

## 2017-03-26 MED ORDER — AMIODARONE HCL 200 MG PO TABS
200.0000 mg | ORAL_TABLET | Freq: Every day | ORAL | Status: DC
  Administered 2017-03-27 – 2017-03-29 (×3): 200 mg via ORAL
  Filled 2017-03-26 (×3): qty 1

## 2017-03-26 NOTE — Progress Notes (Signed)
ANTICOAGULATION CONSULT NOTE - Follow Up Consult  Pharmacy Consult for heparin Indication: Afib and LVAD   Assessment/Plan:  77yo male w/ subtherapeutic heparin level though heparin gtt was hung late and needs more time to accumulate. Will continue gtt at current rate and check level with am labs.   Wynona Neat, PharmD, BCPS  03/26/2017,11:46 PM

## 2017-03-26 NOTE — Progress Notes (Signed)
Procedure(s) (LRB): HEARTMATE 2 EXCHANGE LEFT VENTRICULAR ASSIST DEVICE WITH LEFT SUBCOSTAL APPROACH (N/A) CANNULATION FOR CARDIOPULMONARY BYPASS (N/A) TRANSESOPHAGEAL ECHOCARDIOGRAM (TEE) (N/A) Subjective: Patient examined, x-rays of chest and abdomen of PowerPort personally reviewed  76 year old recipient HeartMate 2 presents to the hospital after reporting pumpoff alarm last p.m. Patient is asymptomatic. He is currently connected with the white lead to the power module, black terminal to a battery. Suspect short to shield causing pumped outage We'll observe and clot data and transmit for evaluation by engineers. Objective: Vital signs in last 24 hours: Temp:  [97.7 F (36.5 C)] 97.7 F (36.5 C) (05/22 1443) Resp:  [14-19] 19 (05/22 1700) BP: (86-95)/(69-73) 86/69 (05/22 1700)  Hemodynamic parameters for last 24 hours:    Intake/Output from previous day: No intake/output data recorded. Intake/Output this shift: No intake/output data recorded.       Exam    General- alert and comfortable   Lungs- clear without rales, wheezes   Cor- regular rate and rhythm, normal VAD machinery hum   Abdomen- soft, non-tender   Extremities - warm, non-tender, minimal edema   Neuro- oriented, appropriate, no focal weakness   Lab Results:  Recent Labs  03/26/17 1217  WBC 6.7  HGB 11.2*  HCT 35.1*  PLT 151   BMET:  Recent Labs  03/26/17 1217  NA 134*  K 4.9  CL 105  CO2 22  GLUCOSE 104*  BUN 33*  CREATININE 1.46*  CALCIUM 9.1    PT/INR:  Recent Labs  03/26/17 1217  LABPROT 22.1*  INR 1.91   ABG    Component Value Date/Time   PHART 7.264 (L) 02/03/2016 0914   HCO3 24.5 (H) 02/03/2016 0914   TCO2 26 02/03/2016 0914   ACIDBASEDEF 3.0 (H) 02/03/2016 0914   O2SAT 100.0 02/03/2016 0914   CBG (last 3)  No results for input(s): GLUCAP in the last 72 hours.  Assessment/Plan: S/P Procedure(s) (LRB): HEARTMATE 2 EXCHANGE LEFT VENTRICULAR ASSIST DEVICE WITH LEFT  SUBCOSTAL APPROACH (N/A) CANNULATION FOR CARDIOPULMONARY BYPASS (N/A) TRANSESOPHAGEAL ECHOCARDIOGRAM (TEE) (N/A) We'll follow Would hold Coumadin and use bridge IV heparin when INR 2.0 or less  In case patient needs pump exchange   LOS: 1 day    Tharon Aquas Trigt III 03/26/2017

## 2017-03-26 NOTE — H&P (Signed)
VAD TEAM History & Physical Note   Reason for Admission: LVAD Complication/ Possible Extrernal Lead Fracture    HPI:   [name] is a 76 year old with a h/o VT, PAF, PAD, systolic heart failure, GI bleed, CVA, RV failure, and HMII LVAD 2014.  GU Event 09/01/14 had impacted ureteral stone and underwent cystoscopy and flexible ureteroscopy with lithotripsy and basket extraction. 09/2015 Hematuria and chronic nephrolithiasis. Renal US showed mild hyronephrosis and bilateral renal cysts. Dr. Risa Grill took back for repeat lithotripsy and stone extraction by ureteroscopy and J stent placement.  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  4/2-18 - Episode of melena. Hgb 10.9-> 8.7. 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  01/2016 R CEA. He was started on Plavix.  01/2016 Lumbar fusion L3-L5 with pedicle screws posterolateral arthrodesis with BMP, allograft  Arrhythmia  07/2016 --> VT with DC-CV  RV Failure  08/2016 Speed cut back to 8800.    Today he presented to the LVAD clinic for low flow alarms and nausea. Says he had low flow alarm lying on his right side and lying on his left side. Overall feeling fair. Mild dyspnea with exertion. Denies PND/Orthopnea. Weight at home 194-198 pounds. Denies BRBPR.   LVAD INTERROGATION:  HeartMate II LVAD:  Flow 3.4 liters/min, speed 8800, power 4.3 , PI 7.7 Low Flow with Pump Stop on 5/21 x1 for 1 second and 5/22 x2 for 3 seconds. .     Review of Systems: [y] = yes, [ ]  = no   General: Weight gain [ ] ; Weight loss [ ] ; Anorexia [ ] ; Fatigue [Y ]; Fever [ ] ; Chills [ ] ; Weakness [ ]   Cardiac: Chest pain/pressure [ ] ; Resting SOB [ ] ; Exertional SOB [Y ]; Orthopnea [ ] ; Pedal Edema [ ] ; Palpitations [ ] ; Syncope [ ] ; Presyncope [ ] ; Paroxysmal nocturnal dyspnea[ ]   Pulmonary: Cough [ ] ; Wheezing[ ] ; Hemoptysis[ ] ; Sputum [ ] ; Snoring [ ]   GI: Vomiting[  ]; Dysphagia[ ] ; Melena[ ] ; Hematochezia [ ] ; Heartburn[ ] ; Abdominal pain [ ] ; Constipation [ ] ; Diarrhea [ ] ; BRBPR [ ]   GU: Hematuria[ ] ; Dysuria [ ] ; Nocturia[ ]   Vascular: Pain in legs with walking [ ] ; Pain in feet with lying flat [ ] ; Non-healing sores [ ] ; Stroke [ Y]; TIA [ ] ; Slurred speech [ ] ;  Neuro: Headaches[ ] ; Vertigo[ ] ; Seizures[ ] ; Paresthesias[ ] ;Blurred vision [ ] ; Diplopia [ ] ; Vision changes [ ]   Ortho/Skin: Arthritis [ ] ; Joint pain [ Y]; Muscle pain [ ] ; Joint swelling [ ] ; Back Pain [ ] ; Rash [ ]   Psych: Depression[ ] ; Anxiety[ ]   Heme: Bleeding problems [ ] ; Clotting disorders [ ] ; Anemia [ ]   Endocrine: Diabetes [ ] ; Thyroid dysfunction[ ]   Home Medications Prior to Admission medications   Medication Sig Start Date End Date Taking? Authorizing Provider  amiodarone (PACERONE) 200 MG tablet Take 1 tablet (200 mg total) by mouth daily. 03/13/17   Fathima Bartl, Shaune Pascal, MD  atorvastatin (LIPITOR) 80 MG tablet Take 1 tablet (80 mg total) by mouth daily at 6 PM. 12/10/16   Gerene Nedd, Shaune Pascal, MD  clopidogrel (PLAVIX) 75 MG tablet Take 1 tablet (75 mg total) by mouth daily. 08/27/16   Elver Stadler, Shaune Pascal, MD  docusate sodium (COLACE) 100 MG capsule Take 100 mg by  mouth daily as needed for mild constipation.     [provider]  furosemide (LASIX) 20 MG tablet Take 1 tablet (20 mg total) by mouth as needed for edema (weight gain). 11/14/15   Zamzam Whinery, Shaune Pascal, MD  levothyroxine (SYNTHROID, LEVOTHROID) 200 MCG tablet TAKE 1 TABLET DAILY BEFORE BREAKFAST WITH 1 & 1/2 TABLETS EVERY MONDAY. 02/04/17   Sherline Eberwein, Shaune Pascal, MD  metoprolol succinate (TOPROL-XL) 25 MG 24 hr tablet Take 1 tablet (25 mg total) by mouth 2 (two) times daily. 05/14/16   Larey Dresser, MD  Multiple Vitamins-Minerals (ICAPS PO) Take 1 tablet by mouth 2 (two) times daily.    [provider]  nitroGLYCERIN (NITROSTAT) 0.4 MG SL tablet Place 0.4 mg under the tongue every 5 (five) minutes as needed  for chest pain.    [provider]  pantoprazole (PROTONIX) 40 MG tablet TAKE 1 TABLET TWICE DAILY 12/05/16   Joelene Barriere, Shaune Pascal, MD  ranolazine (RANEXA) 500 MG 12 hr tablet TAKE 1 TABLET (500 MG TOTAL) BY MOUTH 2 (TWO) TIMES DAILY. 10/26/16   Burnis Halling, Shaune Pascal, MD  warfarin (COUMADIN) 2 MG tablet Take 2 mg on Mon / Wed / Fri . Take 4 mg on Tues / Thurs/ Sat / Sun 02/28/17   Clegg, Amy D, NP  zinc gluconate 50 MG tablet Take 50 mg by mouth 2 (two) times daily.     [provider]    Past Medical History: Past Medical History:  Diagnosis Date  . Arthritis   . Automatic implantable cardioverter-defibrillator in situ    a. s/p Medtronic Evera device serial [id number]    . Bradycardia   . CAD (coronary artery disease)    a. s/p MI 1982;  s/p DES to CFX 2011;  s/p NSTEMI 4/12 in setting of VTach;  cath 7/12:  LM ok, LAD mid 30-40%, Dx's with 40%, mCFX stent patent, prox to mid RCA occluded with R to R and L to R collats.  Medical Tx was continued  . Chronic systolic heart failure ([place])    a. s/p LVAD 10/2013 for destination therapy  . CKD (chronic kidney disease)   . CVD (cerebrovascular disease)    a. s/p L CEA 2008  . Cytopenia   . Dysrhythmia    AFIB  . History of GI bleed    a. on ASA- started on plavix during 10/2015 admission for CVA  . HLD (hyperlipidemia)   . HTN (hypertension)   . Hypothyroidism   . Inappropriate therapy from implantable cardioverter-defibrillator    a. ATP for sinus tach SVT wavelet identified SVT but was no  passive (2014)  . Ischemic cardiomyopathy   . 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 L Ear   . Myocardial infarction ([place])    1992  . PAF (paroxysmal atrial fibrillation) ([place])   . PVD (peripheral vascular disease) ([place])    a. 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 Shortness of breath dyspnea    W/ EXERTION   . Sleep apnea    NO CPAP NOW  HE SENT  IT BACK YRS AGO  . Stroke [facility])    a. 10/2015 admission   . Ventricular tachycardia ([place])    a. s/p AICD;   h/o ICD shock;  Amiodarone Rx. S/P ablation in 2012    Past Surgical History: Past  Surgical History:  Procedure Laterality Date  . BACK SURGERY    . CARDIAC CATHETERIZATION     "several" (05/25/2013)  . CARDIAC DEFIBRILLATOR PLACEMENT  2003; 2005; 05/25/2013   2014: Medtronic Evera device serial number [id number]  . CARDIAC ELECTROPHYSIOLOGY STUDY AND ABLATION  2012  . CARDIOVERSION N/A 10/15/2013   Procedure: CARDIOVERSION;  Surgeon: Jolaine Artist, MD;  Location: [place];  Service: Cardiovascular;  Laterality: N/A;  . CARDIOVERSION N/A 11/04/2013   Procedure: CARDIOVERSION;  Surgeon: Larey Dresser, MD;  Location: [place];  Service: Cardiovascular;  Laterality: N/A;  . CARDIOVERSION N/A 06/04/2014   Procedure: CARDIOVERSION;  Surgeon: Jolaine Artist, MD;  Location: [facility] ENDOSCOPY;  Service: Cardiovascular;  Laterality: N/A;  . CARDIOVERSION N/A 03/02/2015   Procedure: CARDIOVERSION;  Surgeon: Jolaine Artist, MD;  Location: [facility] ENDOSCOPY;  Service: Cardiovascular;  Laterality: N/A;  . CARDIOVERSION N/A 01/19/2016   Procedure: CARDIOVERSION;  Surgeon: Larey Dresser, MD;  Location: [place];  Service: Cardiovascular;  Laterality: N/A;  . CAROTID ENDARTERECTOMY Left ~ 2007  . CATARACT EXTRACTION W/ INTRAOCULAR LENS  IMPLANT, BILATERAL Bilateral 2000  . CHOLECYSTECTOMY  12/15/?2010  . COLONOSCOPY WITH PROPOFOL N/A 05-17-202018   Procedure: COLONOSCOPY WITH PROPOFOL;  Surgeon: Ronald Lobo, MD;  Location: [place];  Service: Endoscopy;  Laterality: N/A;  . CORONARY ANGIOPLASTY  1992  . CORONARY ANGIOPLASTY WITH STENT PLACEMENT     "last one was 11/2012  (05/25/2013)  . CYSTOSCOPY WITH RETROGRADE PYELOGRAM, URETEROSCOPY AND STENT PLACEMENT Left 08/09/2014   Procedure: CYSTOSCOPY WITH RETROGRADE PYELOGRAM, URETEROSCOPY AND STENT PLACEMENT;  Surgeon: Bernestine Amass,  MD;  Location: [facility];  Service: Urology;  Laterality: Left;  . CYSTOSCOPY WITH RETROGRADE PYELOGRAM, URETEROSCOPY AND STENT PLACEMENT Left 09/01/2014   Procedure: CYSTOSCOPY WITH URETEROSCOPY AND STENT REMOVAL;  Surgeon: Bernestine Amass, MD;  Location: [facility];  Service: Urology;  Laterality: Left;  . CYSTOSCOPY WITH RETROGRADE PYELOGRAM, URETEROSCOPY AND STENT PLACEMENT Left 09/20/2014   Procedure: CYSTOSCOPY WITH RETROGRADE PYELOGRAM, URETEROSCOPY AND STENT PLACEMENT, stone removal;  Surgeon: Bernestine Amass, MD;  [facility];  Service: Urology;  Laterality: Left;  . DOPPLER ECHOCARDIOGRAPHY  2011  . ELBOW ARTHROSCOPY Right 1990's  . ELECTROPHYSIOLOGIC STUDY N/A 07/19/2016   Procedure: Cardioversion;  Surgeon: Evans Lance, MD;  Location: [place] CV LAB;  Service: Cardiovascular;  Laterality: N/A;  . ENDARTERECTOMY Right 01/13/2016   Procedure: RIGHT CAROTID ARTERY ENDARTERECTOMY;  Surgeon: Mal Misty, MD;  Location: [place];  Service: Vascular;  Laterality: Right;  . ENTEROSCOPY Left 02/23/2017   Procedure: ENTEROSCOPY;  Surgeon: Ronald Lobo, MD;  Location: [facility] ENDOSCOPY;  Service: Endoscopy;  Laterality: Left;  . ESOPHAGOGASTRODUODENOSCOPY N/A 02/15/2015   Procedure: ESOPHAGOGASTRODUODENOSCOPY (EGD);  Surgeon: Carol Ada, MD;  Location: [facility] ENDOSCOPY;  Service: Endoscopy;  Laterality: N/A;  LVAD  . EYE SURGERY     VITRECTOMY  LEFT 05/2012  . FEMORAL-POPLITEAL BYPASS GRAFT Right 2011  . FEMORAL-POPLITEAL BYPASS GRAFT Left 05/2002   Archie Endo 05/06/2002 (05/25/2013)  . HEEL SPUR SURGERY Right 1990's  . HOLMIUM LASER APPLICATION Left 11/13/3233   Procedure: HOLMIUM LASER APPLICATION;  Surgeon: Bernestine Amass, MD;  [facility];  Service: Urology;  Laterality: Left;  . IMPLANTABLE CARDIOVERTER DEFIBRILLATOR GENERATOR CHANGE N/A 05/25/2013   Procedure: IMPLANTABLE CARDIOVERTER DEFIBRILLATOR GENERATOR CHANGE;  Surgeon: Deboraha Sprang, MD;  Location: [facility] CATH LAB;  Service: Cardiovascular;   Laterality: N/A;  . INSERTION OF IMPLANTABLE LEFT VENTRICULAR ASSIST DEVICE N/A 10/19/2013  Procedure: INSERTION OF IMPLANTABLE LEFT VENTRICULAR ASSIST DEVICE;  Surgeon: Gaye Pollack, MD;  Location: [place];  Service: Open Heart Surgery;  Laterality: N/A;  CIRC ARREST  NITRIC OXIDE  MEDTRONIC ICD  . INTRAOPERATIVE TRANSESOPHAGEAL ECHOCARDIOGRAM N/A 10/19/2013   Procedure: INTRAOPERATIVE TRANSESOPHAGEAL ECHOCARDIOGRAM;  Surgeon: Gaye Pollack, MD;  Location: [facility] OR;  Service: Open Heart Surgery;  Laterality: N/A;  . KNEE ARTHROSCOPY Bilateral 1990's  . LEAD REVISION N/A 06/05/2013   Procedure: LEAD REVISION;  Surgeon: Thompson Grayer, MD;  Location: [facility] CATH LAB;  Service: Cardiovascular;  Laterality: N/A;  . LEFT HEART CATHETERIZATION WITH CORONARY ANGIOGRAM N/A 11/24/2012   Procedure: LEFT HEART CATHETERIZATION WITH CORONARY ANGIOGRAM;  Surgeon: Peter M Martinique, MD;  Location: [facility] CATH LAB;  Service: Cardiovascular;  Laterality: N/A;  . LIPOMA EXCISION Left 2013   "near ear" (05/25/2013)  . [place]; 2000's  . PATCH ANGIOPLASTY Right 01/13/2016   Procedure: WITH  PATCH ANGIOPLASTY;  Surgeon: Mal Misty, MD;  Location: [place];  Service: Vascular;  Laterality: Right;  . RENAL ARTERY BYPASS Bilateral 2003  . REVASCULARIZATION / IN-SITU GRAFT LEG    . RIGHT HEART CATHETERIZATION N/A 09/17/2013   Procedure: RIGHT HEART CATH;  Surgeon: Jolaine Artist, MD;  Location: [facility] CATH LAB;  Service: Cardiovascular;  Laterality: N/A;  . RIGHT HEART CATHETERIZATION N/A 10/14/2013   Procedure: RIGHT HEART CATH;  Surgeon: Jolaine Artist, MD;  Location: [facility] CATH LAB;  Service: Cardiovascular;  Laterality: N/A;  . RIGHT HEART CATHETERIZATION N/A 11/16/2013   Procedure: RIGHT HEART CATH;  Surgeon: Jolaine Artist, MD;  Location: [facility] CATH LAB;  Service: Cardiovascular;  Laterality: N/A;  . RIGHT HEART CATHETERIZATION N/A 07/13/2014   Procedure: RIGHT HEART CATH;  Surgeon: Jolaine Artist, MD;   Location: [facility] CATH LAB;  Service: Cardiovascular;  Laterality: N/A;  . TEE WITHOUT CARDIOVERSION N/A 11/04/2013   Procedure: TRANSESOPHAGEAL ECHOCARDIOGRAM (TEE);  Surgeon: Larey Dresser, MD;  Location: [place];  Service: Cardiovascular;  Laterality: N/A;  . TEE WITHOUT CARDIOVERSION N/A 03/02/2015   Procedure: TRANSESOPHAGEAL ECHOCARDIOGRAM (TEE);  Surgeon: Jolaine Artist, MD;  Location: [facility] ENDOSCOPY;  Service: Cardiovascular;  Laterality: N/A;  . TEE WITHOUT CARDIOVERSION N/A 01/19/2016   Procedure: TRANSESOPHAGEAL ECHOCARDIOGRAM (TEE);  Surgeon: Larey Dresser, MD;  Location: [place];  Service: Cardiovascular;  Laterality: N/A;  . TONSILLECTOMY  1946  . TUMOR EXCISION Left 1960's   "fatty tumor" (05/25/2013)  . V-TACH ABLATION N/A 09/12/2011   Procedure: V-TACH ABLATION;  Surgeon: Evans Lance, MD;  Location: [facility] CATH LAB;  Service: Cardiovascular;  Laterality: N/A;  . V-TACH ABLATION N/A 06/08/2013   Procedure: V-TACH ABLATION;  Surgeon: Evans Lance, MD;  Location: [facility] CATH LAB;  Service: Cardiovascular;  Laterality: N/A;    Family History: Family History  Problem Relation Age of Onset  . Heart attack Mother   . Coronary artery disease Mother   . Cancer Mother   . Heart disease Mother   . Hyperlipidemia Mother   . Hypertension Mother   . Coronary artery disease Father   . Stroke Father   . Cancer Father   . Heart disease Father        before age 50  . Hyperlipidemia Father   . Hypertension Father   . Heart attack Father   . Coronary artery disease Unknown     Social History: Social History   Social History  . Marital status: Divorced  Spouse name: N/A  . Number of children: 2  . Years of education: N/A   Occupational History  . retired     [employer]  . 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 Smoking status: Former Smoker    Packs/day: 0.50    Years: 37.00    Types:  Cigarettes    Quit date: 11/05/2001  . Smokeless tobacco: Never Used  . Alcohol use 0.0 oz/week     Comment: 05/25/2013 "mixed drink couple times/month"  . Drug use: No  . Sexual activity: No   Other Topics Concern  . Not on file   Social History Narrative   Lives with sig other.    Allergies:  Allergies  Allergen Reactions  . Demerol [Meperidine] Other (See Comments)    Paralysis. Could only move eyes. Paralysis. Could only move eyes.  [name] Opium Other (See Comments)    "CAN'T MOVE" CATATONIC PER PT.    Objective:    Vital Signs:   BP: ()/()  Arterial Line BP: ()/()    There were no vitals filed for this visit.  Mean arterial Pressure 90s  Physical Exam: General:  Well appearing. No resp difficulty HEENT: normal Neck: supple. JVP 5-6 Carotids 2+ bilat; no bruits. No lymphadenopathy or thryomegaly appreciated. Cor: Mechanical heart sounds with LVAD hum present. Lungs: clear Abdomen: soft, nontender, nondistended. No hepatosplenomegaly. No bruits or masses. Good bowel sounds. Driveline: C/D/I; securement device intact and driveline incorporated Extremities: no cyanosis, clubbing, rash, edema Neuro: alert & orientedx3, cranial nerves grossly intact. moves all 4 extremities w/o difficulty. Affect pleasant  Telemetry: SR 60s   Labs: Basic Metabolic Panel:  Recent Labs Lab 03/26/17 1217  NA 134*  K 4.9  CL 105  CO2 22  GLUCOSE 104*  BUN 33*  CREATININE 1.46*  CALCIUM 9.1    Liver Function Tests:  Recent Labs Lab 03/26/17 1217  AST 29  ALT 24  ALKPHOS 95  BILITOT 1.0  PROT 6.9  ALBUMIN 4.0   No results for input(s): LIPASE, AMYLASE in the last 168 hours. No results for input(s): AMMONIA in the last 168 hours.  CBC:  Recent Labs Lab 03/26/17 1217  WBC 6.7  HGB 11.2*  HCT 35.1*  MCV 95.1  PLT 151    Cardiac Enzymes: No results for input(s): CKTOTAL, CKMB, CKMBINDEX, TROPONINI in the last 168 hours.  BNP: BNP (last 3 results)  Recent  Labs  07/18/16 2253  BNP 1,289.7*    ProBNP (last 3 results) No results for input(s): PROBNP in the last 8760 hours.   CBG: No results for input(s): GLUCAP in the last 168 hours.  Coagulation Studies:  Recent Labs  03/26/17 03/26/17 1217  LABPROT  --  22.1*  INR 2.0 1.91    Other results: EKG: SR 63 bpm with PVCs.   Imaging: Dg Chest 2 View  Result Date: 03/26/2017 CLINICAL DATA:  Left ventricular assistance device. EXAM: CHEST  2 VIEW COMPARISON:  Radiographs of February 20, 2017. FINDINGS: Stable cardiomediastinal silhouette. Atherosclerosis of thoracic aorta is noted. No pneumothorax or pleural effusion is noted. No acute pulmonary disease is noted. Left-sided pacemaker is unchanged in position. Left ventricular assistance device is again noted. Visualized portion of lead appears intact projected over upper abdomen. IMPRESSION: Aortic atherosclerosis. Left ventricular assistance device is again noted. Visualized portion of lead seen over upper abdomen is intact. Electronically  Signed   By: Marijo Conception, M.D.   On: 03/26/2017 13:53   Dg Abd 2 Views  Result Date: 03/26/2017 CLINICAL DATA:  Evaluate for the LVAD lead.  Looking for fracture. EXAM: ABDOMEN - 2 VIEW COMPARISON:  None. FINDINGS: The bowel gas pattern is normal. There is no evidence of free air. No radio-opaque calculi or other significant radiographic abnormality is seen. Left ventricular assist device is noted. Electronic lead from the power pack to the LVAD device demonstrates no discontinuity. There is slight kinking of the wire 5.4 cm from the insertion on the power pack. Posterior lumbar fusion from L3 through L5. IMPRESSION: Left ventricular assist device is noted. Electronic lead from the power pack to the LVAD device demonstrates no discontinuity. There is slight kinking of the wire 5.4 cm from the insertion on the power pack. Electronically Signed   By: Kathreen Devoid   On: 03/26/2017 13:53          Assessment:   1. LVAD Complication 2. Chronic Systolic Heart Failure 3. PAF 4. H/O VT 5. H/O CVA 6. H/O GI bleed 7. H/O Amio induced hyperthyroidism.  8. Chronic  Anticoagulation   Plan/Discussion:    [name] is a 76 year old with a h/o VT, PAF, PAD, systolic heart failure, GI bleed, CVA, RV failure, and HMII LVAD 2014.   Admitted today with LVAD complication. 3 pump stops over the last 24 hours. CXR and abd xray completed. Log file sent to [facility]. May need external repair. Place 2 saline locks. Placed on [employer]. If he does have fracture will need [facility] [employer] to repair at bedside and will need central line placed with epi at the bed side in the event pump stop occurs.    Labs completed in the HF/LVAD clinic stable.   Hold coumadin and Plavix in case he needs pump exchange.    Dr Darcey Nora aware.  I reviewed the LVAD parameters from today, and compared the results to the patient's prior recorded data.  No programming changes were made.  The LVAD is functioning within specified parameters.  The patient performs LVAD self-test daily.  LVAD interrogation was negative for any significant power changes, alarms or PI events/speed drops.  LVAD equipment check completed and is in good working order.  Back-up equipment present.   LVAD education done on emergency procedures and precautions and reviewed exit site care.  Length of Stay: 0  Amy Clegg NP-C  03/26/2017, 2:07 PM  VAD Team Pager [phone] (7am - 7am) +++VAD ISSUES ONLY+++   Advanced Heart Failure Team Pager [phone] (M-F; [place])  Please contact [facility] for night-coverage after hours (4p -7a ) and weekends on [email or web address] for all non- LVAD Issues  Patient seen and examined with Darrick Grinder, NP. We discussed all aspects of the encounter. I agree with the assessment and plan as stated above.   Patient with multiple LVAD pump stops likely due to driveline malfunction. Xrays show  possible internal kinking. Discussed with [facility] clinical team and Dr. Prescott Gum. Will hold coumadin and Plavix in case he needs pump exchange. Will keep on grounded cable with black battery connected at all times. Further plans based on discussion with VAD engineers and location of driveline defect.   Glori Bickers, MD  3:00 PM

## 2017-03-26 NOTE — Progress Notes (Signed)
Peripherally Inserted Central Catheter/Midline Placement  The IV Nurse has discussed with the patient and/or persons authorized to consent for the patient, the purpose of this procedure and the potential benefits and risks involved with this procedure.  The benefits include less needle sticks, lab draws from the catheter, and the patient may be discharged home with the catheter. Risks include, but not limited to, infection, bleeding, blood clot (thrombus formation), and puncture of an artery; nerve damage and irregular heartbeat and possibility to perform a PICC exchange if needed/ordered by physician.  Alternatives to this procedure were also discussed.  Bard Power PICC patient education guide, fact sheet on infection prevention and patient information card has been provided to patient /or left at bedside.    PICC/Midline Placement Documentation  PICC Double Lumen 21/11/55 PICC Right Basilic 41 cm 2 cm (Active)  Indication for Insertion or Continuance of Line Poor Vasculature-patient has had multiple peripheral attempts or PIVs lasting less than 24 hours 03/26/2017  7:00 PM  Exposed Catheter (cm) 2 cm 03/26/2017  7:00 PM  Dressing Change Due 04/02/17 03/26/2017  7:00 PM   Consent done by [place], [place] 03/26/2017, 7:15 PM

## 2017-03-26 NOTE — Plan of Care (Signed)
Problem: Education: Goal: Knowledge of [facility] Education information/materials will improve Outcome: Progressing Education ongoing  Problem: Safety: Goal: Ability to remain free from injury will improve Outcome: Progressing Bedrest brp  Problem: Pain Managment: Goal: General experience of comfort will improve Outcome: Progressing Minimal c/o pain  Problem: Physical Regulation: Goal: Ability to maintain clinical measurements within normal limits will improve Outcome: Progressing Within parameters

## 2017-03-26 NOTE — Progress Notes (Signed)
Spoke with RN about PICC line placement and made her aware that PICC could not be placed at this time and that if patient was urgently needing IV access I would recommend a central line be placed by MD.

## 2017-03-26 NOTE — Progress Notes (Signed)
ANTICOAGULATION CONSULT NOTE - Initial Consult  Pharmacy Consult for heparin Indication: atrial fibrillation and LVAD  Allergies  Allergen Reactions  . Demerol [Meperidine] Other (See Comments)    Paralysis. Could only move eyes. Paralysis. Could only move eyes.  [name] Opium Other (See Comments)    "CAN'T MOVE" CATATONIC PER PT.    Patient Measurements:   Heparin Dosing Weight:   Vital Signs:    Labs:  Recent Labs  03/26/17 03/26/17 1217  HGB  --  11.2*  HCT  --  35.1*  PLT  --  151  LABPROT  --  22.1*  INR 2.0 1.91  CREATININE  --  1.46*    CrCl cannot be calculated (Unknown ideal weight.).   Medical History: Past Medical History:  Diagnosis Date  . Arthritis   . Automatic implantable cardioverter-defibrillator in situ    a. s/p Medtronic Evera device serial [id number]    . Bradycardia   . CAD (coronary artery disease)    a. s/p MI 1982;  s/p DES to CFX 2011;  s/p NSTEMI 4/12 in setting of VTach;  cath 7/12:  LM ok, LAD mid 30-40%, Dx's with 40%, mCFX stent patent, prox to mid RCA occluded with R to R and L to R collats.  Medical Tx was continued  . Chronic systolic heart failure ([place])    a. s/p LVAD 10/2013 for destination therapy  . CKD (chronic kidney disease)   . CVD (cerebrovascular disease)    a. s/p L CEA 2008  . Cytopenia   . Dysrhythmia    AFIB  . History of GI bleed    a. on ASA- started on plavix during 10/2015 admission for CVA  . HLD (hyperlipidemia)   . HTN (hypertension)   . Hypothyroidism   . Inappropriate therapy from implantable cardioverter-defibrillator    a. ATP for sinus tach SVT wavelet identified SVT but was no  passive (2014)  . Ischemic cardiomyopathy   . Melanoma of ear ([place])    a. L Ear   . Myocardial infarction ([place])    1992  . PAF (paroxysmal atrial fibrillation) ([place])   . PVD (peripheral vascular disease) ([place])    a. h/o claudication;  s/p R fem to pop BPG 3/11;  s/p L CFA BPG 7/03;  s/p repair of inf AAA 6/03;  s/p  aorta to bilat renal BPG 6/03  . Shortness of breath dyspnea    W/ EXERTION   . Sleep apnea    NO CPAP NOW  HE SENT IT BACK YRS AGO  . Stroke [facility])    a. 10/2015 admission   . Ventricular tachycardia ([place])    a. s/p AICD;   h/o ICD shock;  Amiodarone Rx. S/P ablation in 2012     Assessment: 74 YOM admitted for LVAD Complication/ Possible Extrernal Lead Fracture. On warfarin at home, but currently held for potential need of a pump exchange. INR today at admit 1.91. Patient was admitted one month ago for a GI bleed. After the bleed resolved, he was therapeutic at 950 units/hr. CBC okay toda  Home warfarin: 2mg  MWF, 4mg  TTSS  Goal of Therapy:  Heparin level 0.3-0.5 units/ml Monitor platelets by anticoagulation protocol: Yes   Plan:  -Heparin at 950 units/hr, no bolus since INR 1.9 (also recent GIB just last month) -8 hr HL, goal 0.3-0.5 -Daily CBC/HL -Monitor S/Sx bleeding  Myer Peer Grayland Ormond), PharmD  PGY1 Pharmacy Resident Pager: [phone] 03/26/2017 3:35 PM

## 2017-03-26 NOTE — Progress Notes (Signed)
Discussed with Dr. Prescott Gum.   Will hold plavix and coumadin in the event pt needs surgery.   Will use heparin if INR drops below 2.0.  Legrand Como [address] [place], PA-C 03/26/2017 2:54 PM

## 2017-03-26 NOTE — Progress Notes (Signed)
Cards fellow paged and spoke with regarding frequent pvc's. Sometimes greater than 10-15 per minute. VSS, VAD numbers within parameters. No orders received at this time. Will continue to monitor closely. Eleonore Chiquito RN 2 Heart

## 2017-03-26 NOTE — Progress Notes (Signed)
Admitted 03/26/17 due to possible external lead fracture.   HeartMate II LVAD implated on 10/19/2013 by Dr. Cyndia Bent.   Vital signs: HR: 61 Doppler Pressure:80 Automatic BP: 95/73 (79) O2 Sat: 98 on RA Wt:not obtained at this time     LVAD interrogation reveals:  Speed:8800 Flow: 3.4 Power:  4.3 PI:7.7  Alarms: Low flow and pump stop alarms 5/21 x1 and 5/22 x2. Events:  5-10 PI events daily Fixed speed: 8800 Low speed limit: 8200  Drive Line: Dressing unremarkable. Changed by [name] weekly.  Labs:  LDH trend:270  INR trend: 1.91  Anticoagulation Plan: -INR Goal: lower to 2. Then start heparin per pharmacy. -ASA Dose: none. Stopped due to GIB history.  Adverse Events on VAD: 02/2015 >>GIB (removed ASA, 2-2.5 INR) 10/2015 >>CVA(Plavix started) 02/2017> GIB (removed ASA and Plavix, scopes negative)  Plan/Recommendations:   1. Nursing-  White lead is to stay connected to the power module on a grounded cable. Black lead is to stay connected to an external battery. In the event of an alarm, specifically a "pump stop" alarm, remove white lead from power module and place on a battery. Then immediately page the [place] pager.   2. The orange (non-grounded) power module cable on the VAD cart is to be used if instructed by the VAD team ONLY.  3. Dressings changed by [name], girlfriend, weekly.  Balinda Quails RN, VAD Coordinator 24/7 pager [phone]

## 2017-03-26 NOTE — Progress Notes (Signed)
Patient ID: [name], male   DOB: Jul 05, 1941, 76 y.o.   MRN: [id number]   VAD CLINIC PROGRESS NOTE  PCP: Dr. Kandice [name] ([name]) GU: Dr [name]  Neuro Surgeon: Dr [name] Vascular: Dr [name] Primary HF: Dr [name]  [name] is a 76 yo male with h/o VT, PAD and severe systolic HF (EF 02-72%) due to mixed ischemic/nonischemic CM he is s/p HM II LVAD implant for destination therapy on October 19, 2013.VAD implantation was complicated by VT, syncope, AF/Aflutter and RHF. Required short term milrinone.    GU Event 09/01/14 had impacted ureteral stone and underwent cystoscopy and flexible ureteroscopy with lithotripsy and basket extraction.   09/2015 Hematuria and chronic nephrolithiasis. Renal US showed mild hyronephrosis and bilateral renal cysts. Dr. Risa [name] took back for repeat lithotripsy and stone extraction by ureteroscopy and J stent placement.  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  4/2-18 - Episode of melena. Hgb 10.9-> 8.7. 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  01/2016 R CEA. He was started on Plavix.  01/2016 Lumbar fusion L3-L5 with pedicle screws posterolateral arthrodesis with BMP, allograft  Arrhythmia  07/2016 --> VT with DC-CV  RV Failure  08/2016 Speed cut back to 8800.   He is here for an acute work in for low flow alarms and nausea with his girl friend. Overall feeling fair. SOB with exertion. Denies PND/Orthopnea. Weight at home 194-198 pounds. He has not had any lasix. No BRBPR. Taking all medications.   VAD Indication: DT - age excluding  VAD interrogation & Equipment Management: Speed 8800 Flow: 3.4 Power: 4.3  PI: 7.7   Alarms:  Events: Had low flow with pump stop  5/21 x1 and 5/22 x2 Fixed speed 8800 Low speed limit: 8200  LVAD interrogated and functioning as expected    Past Medical History:  Diagnosis Date  . Arthritis   . Automatic  implantable cardioverter-defibrillator in situ    a. s/p [name] device serial [id number]    . Bradycardia   . CAD (coronary artery disease)    a. s/p MI 1982;  s/p DES to CFX 2011;  s/p NSTEMI 4/12 in setting of VTach;  cath 7/12:  LM ok, LAD mid 30-40%, Dx's with 40%, mCFX stent patent, prox to mid RCA occluded with R to R and L to R collats.  Medical Tx was continued  . Chronic systolic heart failure ([name])    a. s/p LVAD 10/2013 for destination therapy  . CKD (chronic kidney disease)   . CVD (cerebrovascular disease)    a. s/p L CEA 2008  . Cytopenia   . Dysrhythmia    AFIB  . History of GI bleed    a. on ASA- started on plavix during 10/2015 admission for CVA  . HLD (hyperlipidemia)   . HTN (hypertension)   . Hypothyroidism   . Inappropriate therapy from implantable cardioverter-defibrillator    a. ATP for sinus tach SVT wavelet identified SVT but was no  passive (2014)  . Ischemic cardiomyopathy   . Melanoma of ear ([name])    a. L Ear   . Myocardial infarction ([name])    1992  . PAF (paroxysmal atrial fibrillation) ([name])   . PVD (peripheral vascular disease) ([name])    a. h/o claudication;  s/p R fem to pop BPG 3/11;  s/p L CFA BPG 7/03;  s/p repair of inf AAA 6/03;  s/p  aorta to bilat renal BPG 6/03  . Shortness of breath dyspnea    W/ EXERTION   . Sleep apnea    NO CPAP NOW  HE SENT IT BACK YRS AGO  . Stroke [name])    a. 10/2015 admission   . Ventricular tachycardia ([name])    a. s/p AICD;   h/o ICD shock;  Amiodarone Rx. S/P ablation in 2012    Current Outpatient Prescriptions  Medication Sig Dispense Refill  . amiodarone (PACERONE) 200 MG tablet Take 1 tablet (200 mg total) by mouth daily. 90 tablet 3  . atorvastatin (LIPITOR) 80 MG tablet Take 1 tablet (80 mg total) by mouth daily at 6 PM. 30 tablet 6  . clopidogrel (PLAVIX) 75 MG tablet Take 1 tablet (75 mg total) by mouth daily. 90 tablet 3  . docusate sodium (COLACE) 100 MG capsule Take 100 mg by mouth daily as  needed for mild constipation.     . furosemide (LASIX) 20 MG tablet Take 1 tablet (20 mg total) by mouth as needed for edema (weight gain). 30 tablet 3  . levothyroxine (SYNTHROID, LEVOTHROID) 200 MCG tablet TAKE 1 TABLET DAILY BEFORE BREAKFAST WITH 1 & 1/2 TABLETS EVERY MONDAY. 45 tablet 6  . metoprolol succinate (TOPROL-XL) 25 MG 24 hr tablet Take 1 tablet (25 mg total) by mouth 2 (two) times daily. 180 tablet 3  . Multiple Vitamins-Minerals (ICAPS PO) Take 1 tablet by mouth 2 (two) times daily.    . pantoprazole (PROTONIX) 40 MG tablet TAKE 1 TABLET TWICE DAILY 60 tablet 3  . ranolazine (RANEXA) 500 MG 12 hr tablet TAKE 1 TABLET (500 MG TOTAL) BY MOUTH 2 (TWO) TIMES DAILY. 60 tablet 6  . warfarin (COUMADIN) 2 MG tablet Take 2 mg on Mon / Wed / Fri . Take 4 mg on Tues / Thurs/ Sat / Sun 50 tablet 3  . zinc gluconate 50 MG tablet Take 50 mg by mouth 2 (two) times daily.     . nitroGLYCERIN (NITROSTAT) 0.4 MG SL tablet Place 0.4 mg under the tongue every 5 (five) minutes as needed for chest pain.     No current facility-administered medications for this encounter.     Demerol [meperidine] and Opium  REVIEW OF SYSTEMS: All systems negative except as listed in HPI, PMH and Problem list.  Vital Signs:  Doppler Pressure 118              Automatc BP: 113/74(94)  HR:61  SPO2 97%  Weight:   Last weight: 200 lb Home weights: 194  lbs  Physical Exam: GENERAL: Appears fatigued. Male who presents to clinic today in no acute distress. HEENT: normal  NECK: Supple, JVP 5-6.  2+ bilaterally, no bruits.  No lymphadenopathy or thyromegaly appreciated.   CARDIAC:  Mechanical heart sounds with LVAD hum present.  LUNGS:  Clear to auscultation bilaterally.  ABDOMEN:  Soft, round, nontender, positive bowel sounds x4.     LVAD exit site: well-healed and incorporated.  Dressing dry and intact.  No erythema or drainage.  Stabilization device present and accurately applied.  Driveline dressing is being  changed daily per sterile technique. EXTREMITIES:  Warm and dry, no cyanosis, clubbing, rash or trace edema  NEUROLOGIC:  Alert and oriented x 4.  Gait steady.  No aphasia.  No dysarthria.  Affect pleasant.      ASSESSMENT AND PLAN:  1. LVAD- Complication He has had 3 pump stops on VAD interrogation.  Log file sent. This is the  first pump stop. Placed on battery and will place on Ungrounded Cable.  Send for 2 view CXR and 2 abdomen Xray to look for possible fracture.  Thoratec aware and on stand by to send Engineers.  2. Chronic systolic HF: s/p HM-II LVAD 10/2014.   NYHA II. Volume status stable. Does not need lasix.  2.  Atrial arrhythmias: Atrial fibrillation, atrial tachycardia.  S/p DC-CV 06/04/14, DC-CV 03/02/15 and 01/19/16.   -Junctional Rhythm. Continue amio to 200 mg daily.   3. HTN:  --MAP elevated. WIll watch and adjust as needed.  4. CKD Stage III- Renal function stable today.   5. Anticoagulation management:  --Goal INR 2-2.5  INR 1.9 today. He is off ASA with GI bleeding. Continue Plavix.  6. H/o GIB:  --s/p clipping of 2 jejunal AVMs in 4/16. Continue coumadin and plavix.(with h/o CVA). --Admitted 4/18 with recurrent GIB. Transfused 2u. EGD/colon negative --Todays hgb stable 11.2. Continue PPI      8. Lumbar compression fracture:  s/p L3-L5 fusion 02/03/16. --back pain resolved 9. 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  Continue atorvastatin. (also warfarin INR goal 2-2.5). S/p R CEA 01/13/16. Continue  plavix.  11. H/o amio-induced hypethyroidism:  --Followed by Dr. Forde Dandy in past.  --Amio restarted peri-operatively at low dose due to recurrent atrial arrhythmias and VT.  --Recent TSH ok.  --Continue to follow 12. VT  - underwent DC-CV 9/17. Back on amio. Remains quiescent.   Send for CXR and Abd films now to assess for lead fractures. Admit for observation and possible external lead repair. Will keep on batteries for now and place 2 IV s.   Admit to [place].     Makylah Bossard NP-C  1:00 PM

## 2017-03-27 ENCOUNTER — Encounter (HOSPITAL_COMMUNITY): Payer: Self-pay | Admitting: Certified Registered Nurse Anesthetist

## 2017-03-27 DIAGNOSIS — E785 Hyperlipidemia, unspecified: Secondary | ICD-10-CM | POA: Diagnosis present

## 2017-03-27 DIAGNOSIS — I480 Paroxysmal atrial fibrillation: Secondary | ICD-10-CM | POA: Diagnosis not present

## 2017-03-27 DIAGNOSIS — I130 Hypertensive heart and chronic kidney disease with heart failure and stage 1 through stage 4 chronic kidney disease, or unspecified chronic kidney disease: Secondary | ICD-10-CM | POA: Diagnosis not present

## 2017-03-27 DIAGNOSIS — Z7901 Long term (current) use of anticoagulants: Secondary | ICD-10-CM | POA: Diagnosis not present

## 2017-03-27 DIAGNOSIS — I5022 Chronic systolic (congestive) heart failure: Secondary | ICD-10-CM | POA: Diagnosis not present

## 2017-03-27 DIAGNOSIS — Z95811 Presence of heart assist device: Secondary | ICD-10-CM | POA: Diagnosis not present

## 2017-03-27 DIAGNOSIS — I2510 Atherosclerotic heart disease of native coronary artery without angina pectoris: Secondary | ICD-10-CM | POA: Diagnosis not present

## 2017-03-27 DIAGNOSIS — I255 Ischemic cardiomyopathy: Secondary | ICD-10-CM | POA: Diagnosis present

## 2017-03-27 DIAGNOSIS — Z85820 Personal history of malignant melanoma of skin: Secondary | ICD-10-CM | POA: Diagnosis not present

## 2017-03-27 DIAGNOSIS — Z955 Presence of coronary angioplasty implant and graft: Secondary | ICD-10-CM | POA: Diagnosis not present

## 2017-03-27 DIAGNOSIS — I959 Hypotension, unspecified: Secondary | ICD-10-CM | POA: Diagnosis not present

## 2017-03-27 DIAGNOSIS — Z79899 Other long term (current) drug therapy: Secondary | ICD-10-CM | POA: Diagnosis not present

## 2017-03-27 DIAGNOSIS — Z8673 Personal history of transient ischemic attack (TIA), and cerebral infarction without residual deficits: Secondary | ICD-10-CM | POA: Diagnosis not present

## 2017-03-27 DIAGNOSIS — I252 Old myocardial infarction: Secondary | ICD-10-CM | POA: Diagnosis not present

## 2017-03-27 DIAGNOSIS — E0590 Thyrotoxicosis, unspecified without thyrotoxic crisis or storm: Secondary | ICD-10-CM | POA: Diagnosis not present

## 2017-03-27 DIAGNOSIS — T829XXA Unspecified complication of cardiac and vascular prosthetic device, implant and graft, initial encounter: Secondary | ICD-10-CM | POA: Diagnosis not present

## 2017-03-27 DIAGNOSIS — Y831 Surgical operation with implant of artificial internal device as the cause of abnormal reaction of the patient, or of later complication, without mention of misadventure at the time of the procedure: Secondary | ICD-10-CM | POA: Diagnosis present

## 2017-03-27 LAB — LACTATE DEHYDROGENASE: LDH: 251 U/L — AB (ref 98–192)

## 2017-03-27 LAB — CBC
HCT: 33.7 % — ABNORMAL LOW (ref 39.0–52.0)
Hemoglobin: 10.9 g/dL — ABNORMAL LOW (ref 13.0–17.0)
MCH: 30.7 pg (ref 26.0–34.0)
MCHC: 32.3 g/dL (ref 30.0–36.0)
MCV: 94.9 fL (ref 78.0–100.0)
Platelets: 141 10*3/uL — ABNORMAL LOW (ref 150–400)
RBC: 3.55 MIL/uL — ABNORMAL LOW (ref 4.22–5.81)
RDW: 15.8 % — ABNORMAL HIGH (ref 11.5–15.5)
WBC: 5.9 10*3/uL (ref 4.0–10.5)

## 2017-03-27 LAB — PROTIME-INR
INR: 1.94
PROTHROMBIN TIME: 22.4 s — AB (ref 11.4–15.2)

## 2017-03-27 LAB — BASIC METABOLIC PANEL
Anion gap: 8 (ref 5–15)
BUN: 29 mg/dL — ABNORMAL HIGH (ref 6–20)
CALCIUM: 8.9 mg/dL (ref 8.9–10.3)
CO2: 25 mmol/L (ref 22–32)
CREATININE: 1.42 mg/dL — AB (ref 0.61–1.24)
Chloride: 105 mmol/L (ref 101–111)
GFR calc non Af Amer: 46 mL/min — ABNORMAL LOW (ref >60)
GFR, EST AFRICAN AMERICAN: 53 mL/min — AB (ref >60)
Glucose, Bld: 104 mg/dL — ABNORMAL HIGH (ref 65–99)
Potassium: 4.2 mmol/L (ref 3.5–5.1)
SODIUM: 138 mmol/L (ref 135–145)

## 2017-03-27 LAB — HEPARIN LEVEL (UNFRACTIONATED)
Heparin Unfractionated: 0.32 IU/mL (ref 0.30–0.70)
Heparin Unfractionated: 0.37 IU/mL (ref 0.30–0.70)

## 2017-03-27 MED ORDER — EPINEPHRINE PF 1 MG/10ML IJ SOSY
PREFILLED_SYRINGE | INTRAMUSCULAR | Status: AC
  Filled 2017-03-27: qty 10

## 2017-03-27 NOTE — Progress Notes (Signed)
[place] for heparin Indication: atrial fibrillation and LVAD  Allergies  Allergen Reactions  . Demerol [Meperidine] Other (See Comments)    Paralysis. Could only move eyes. Paralysis. Could only move eyes.    Patient Measurements: Weight: 202 lb 6.4 oz (91.8 kg) Heparin Dosing Weight:   Vital Signs: Temp: 97.9 F (36.6 C) (05/23 1200) Temp Source: Oral (05/23 1200) BP: 81/68 (05/23 1200) Pulse Rate: 60 (05/23 1400)  Labs:  Recent Labs  03/26/17 03/26/17 1217 03/26/17 2308 03/27/17 0449 03/27/17 1400  HGB  --  11.2*  --  10.9*  --   HCT  --  35.1*  --  33.7*  --   PLT  --  151  --  141*  --   LABPROT  --  22.1*  --  22.4*  --   INR 2.0 1.91  --  1.94  --   HEPARINUNFRC  --   --  0.18* 0.32 0.37  CREATININE  --  1.46*  --  1.42*  --     Estimated Creatinine Clearance: 47.8 mL/min (A) (by C-G formula based on SCr of 1.42 mg/dL (H)).   Medical History: Past Medical History:  Diagnosis Date  . Arthritis   . Automatic implantable cardioverter-defibrillator in situ    a. s/p Medtronic Evera device serial [id number]    . Bradycardia   . CAD (coronary artery disease)    a. s/p MI 1982;  s/p DES to CFX 2011;  s/p NSTEMI 4/12 in setting of VTach;  cath 7/12:  LM ok, LAD mid 30-40%, Dx's with 40%, mCFX stent patent, prox to mid RCA occluded with R to R and L to R collats.  Medical Tx was continued  . Chronic systolic heart failure ([place])    a. s/p LVAD 10/2013 for destination therapy  . CKD (chronic kidney disease)   . CVD (cerebrovascular disease)    a. s/p L CEA 2008  . Cytopenia   . Dysrhythmia    AFIB  . History of GI bleed    a. on ASA- started on plavix during 10/2015 admission for CVA  . HLD (hyperlipidemia)   . HTN (hypertension)   . Hypothyroidism   . Inappropriate therapy from implantable cardioverter-defibrillator    a. ATP for sinus tach SVT wavelet identified SVT but was no  passive (2014)  . Ischemic  cardiomyopathy   . Melanoma of ear ([place])    a. L Ear   . Myocardial infarction ([place])    1992  . PAF (paroxysmal atrial fibrillation) ([place])   . PVD (peripheral vascular disease) ([place])    a. 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 Shortness of breath dyspnea    W/ EXERTION   . Sleep apnea    NO CPAP NOW  HE SENT IT BACK YRS AGO  . Stroke [facility])    a. 10/2015 admission   . Ventricular tachycardia ([place])    a. s/p AICD;   h/o ICD shock;  Amiodarone Rx. S/P ablation in 2012    Assessment: 61 YOM admitted for LVAD Complication/ Possible Extrernal Lead Fracture. On warfarin at home, but currently held for potential need of a pump exchange.   INR today is stable from yesterday 1.91>>1.94. Patient was admitted one month ago for a GI bleed. After the bleed resolved, he was therapeutic  at 950 units/hr. Hgb 10.9, Plts 141. Confirmatory heparin level this afternoon continues to be at goal.   Home warfarin: 2mg  MWF, 4mg  TTSS  Goal of Therapy:  Heparin level 0.3-0.5 units/ml Monitor platelets by anticoagulation protocol: Yes   Plan:  -Continue heparin at 950 units/hr -Daily CBC/HL -Monitor S/Sx bleeding  Erin Hearing PharmD., BCPS Clinical Pharmacist Pager [phone] 03/27/2017 3:19 PM

## 2017-03-27 NOTE — Progress Notes (Signed)
Paged by bedside nurse with reports of one pump stop alarm. Patient was symptomatic with nausea and vomiting that resolved quickly. He was sitting in the chair speaking with the nurse at the time of alarm. MAP 90 and zofran given per RN. Personally assessed patient. At the time of alarm, white lead was connected to power module and black was connected to en external battery as instructed. When alarm sounded, bedside RN placed patient on two external batteries. Log files sent to [employer]. Dr Haroldine Laws and Dr Prescott Gum notified. Plans made to repair percutaneous lead.   Balinda Quails RN, VAD Coordinator 24/7 pager [phone]

## 2017-03-27 NOTE — Progress Notes (Signed)
Low flow/pump stop alarm. VAD pager paged. Ungrounded cable hooked up per instructions. VAD coordinator en route. Dr. Haroldine Laws to be paged. Will continue to monitor closely. Eleonore Chiquito RN 2 Heart

## 2017-03-27 NOTE — Plan of Care (Signed)
Problem: Education: Goal: Knowledge of [place] [facility] Education information/materials will improve Outcome: Progressing Education ongoing  Problem: Safety: Goal: Ability to remain free from injury will improve Outcome: Progressing Driveline repair in AM  Problem: Health Behavior/Discharge Planning: Goal: Ability to manage health-related needs will improve Outcome: Progressing Education ongoing  Problem: Pain Managment: Goal: General experience of comfort will improve Outcome: Progressing No C/O pain  Problem: Physical Regulation: Goal: Ability to maintain clinical measurements within normal limits will improve Outcome: Progressing Within parameters

## 2017-03-27 NOTE — Care Management Note (Signed)
Case Management Note Marvetta Gibbons RN, BSN Unit 2W-Case Manager-- [place] coverage [phone]  Patient Details  Name: SILVIO SAUSEDO MRN: [id number] Date of Birth: April 24, 1941  Subjective/Objective:  LVAD pt presenting with pump stops- started on IV heparin- will require lead revision per MD note                  Action/Plan: PTA pt lived at home - CM to follow for d/c needs  Expected Discharge Date:                  Expected Discharge Plan:  [place]  In-House Referral:     Discharge planning Services  CM Consult  Post Acute Care Choice:    Choice offered to:     DME Arranged:    DME Agency:     HH Arranged:    [place] Agency:     Status of Service:  In process, will continue to follow  If discussed at Long Length of Stay Meetings, dates discussed:    Discharge Disposition:   Additional Comments:  Dawayne Patricia, RN 03/27/2017, 10:06 AM

## 2017-03-27 NOTE — Progress Notes (Signed)
ANTICOAGULATION CONSULT NOTE - Initial Consult  Pharmacy Consult for heparin Indication: atrial fibrillation and LVAD  Allergies  Allergen Reactions  . Demerol [Meperidine] Other (See Comments)    Paralysis. Could only move eyes. Paralysis. Could only move eyes.  [name] Opium Other (See Comments)    "CAN'T MOVE" CATATONIC PER PT.    Patient Measurements:   Heparin Dosing Weight:   Vital Signs: Temp: 97.8 F (36.6 C) (05/23 0000) Temp Source: Oral (05/23 0000) BP: 107/81 (05/23 0500) Pulse Rate: 58 (05/23 0500)  Labs:  Recent Labs  03/26/17 03/26/17 1217 03/26/17 2308 03/27/17 0449  HGB  --  11.2*  --  10.9*  HCT  --  35.1*  --  33.7*  PLT  --  151  --  141*  LABPROT  --  22.1*  --  22.4*  INR 2.0 1.91  --  1.94  HEPARINUNFRC  --   --  0.18* 0.32  CREATININE  --  1.46*  --  1.42*    CrCl cannot be calculated (Unknown ideal weight.).   Medical History: Past Medical History:  Diagnosis Date  . Arthritis   . Automatic implantable cardioverter-defibrillator in situ    a. s/p Medtronic Evera device serial [id number]    . Bradycardia   . CAD (coronary artery disease)    a. s/p MI 1982;  s/p DES to CFX 2011;  s/p NSTEMI 4/12 in setting of VTach;  cath 7/12:  LM ok, LAD mid 30-40%, Dx's with 40%, mCFX stent patent, prox to mid RCA occluded with R to R and L to R collats.  Medical Tx was continued  . Chronic systolic heart failure ([place])    a. s/p LVAD 10/2013 for destination therapy  . CKD (chronic kidney disease)   . CVD (cerebrovascular disease)    a. s/p L CEA 2008  . Cytopenia   . Dysrhythmia    AFIB  . History of GI bleed    a. on ASA- started on plavix during 10/2015 admission for CVA  . HLD (hyperlipidemia)   . HTN (hypertension)   . Hypothyroidism   . Inappropriate therapy from implantable cardioverter-defibrillator    a. ATP for sinus tach SVT wavelet identified SVT but was no  passive (2014)  . Ischemic cardiomyopathy   . 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 L  Ear   . Myocardial infarction ([place])    1992  . PAF (paroxysmal atrial fibrillation) ([place])   . PVD (peripheral vascular disease) ([place])    a. 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 Shortness of breath dyspnea    W/ EXERTION   . Sleep apnea    NO CPAP NOW  HE SENT IT BACK YRS AGO  . Stroke [facility])    a. 10/2015 admission   . Ventricular tachycardia ([place])    a. s/p AICD;   h/o ICD shock;  Amiodarone Rx. S/P ablation in 2012     Assessment: 27 YOM admitted for LVAD Complication/ Possible Extrernal Lead Fracture. On warfarin at home, but currently held for potential need of a pump exchange. INR today is stable from yesterday 1.91>>1.94. Patient was admitted one month ago for a GI bleed. After the bleed resolved, he was therapeutic at 950 units/hr. Hgb 10.9, Plts 141. HL this AM 0.32 therapeutic.  Home warfarin: 2mg  MWF, 4mg  TTSS  Goal of Therapy:  Heparin level 0.3-0.5 units/ml Monitor platelets by anticoagulation protocol: Yes   Plan:  -Continue heparin at 950 units/hr -8 hr cHL, goal 0.3-0.5 -Daily CBC/HL -Monitor S/Sx bleeding  Myer Peer Grayland Ormond), PharmD  PGY1 Pharmacy Resident Pager: [phone] 03/27/2017 6:03 AM

## 2017-03-27 NOTE — Progress Notes (Signed)
HeartMate 2 Rounding Note  Subjective:    Admitted with LVAD pump stop. Off coumadin and plavix. Started on heparin.  Pump stop this morning in the chair. Says he felt nauseated when it happened. Nausea resolved with zofran.   INR 1.94   LVAD INTERROGATION:  HeartMate II LVAD:  Flow 3.8 liters/min, speed 8800, power 4.4, PI  6.2 2 Pump Stops this morning with an additional low flow.  .   Objective:    Vital Signs:   Temp:  [97.7 F (36.5 C)-97.8 F (36.6 C)] 97.8 F (36.6 C) (05/23 0000) Pulse Rate:  [42-63] 57 (05/23 0600) Resp:  [8-20] 12 (05/23 0600) BP: (84-123)/(54-89) 84/54 (05/23 0600) SpO2:  [92 %-98 %] 96 % (05/23 0600) Weight:  [202 lb 6.4 oz (91.8 kg)] 202 lb 6.4 oz (91.8 kg) (05/23 0500) Last BM Date: 03/26/17 Mean arterial Pressure 60-80s   Intake/Output:   Intake/Output Summary (Last 24 hours) at 03/27/17 0716 Last data filed at 03/27/17 0600  Gross per 24 hour  Intake           238.51 ml  Output             1200 ml  Net          -961.49 ml     Physical Exam: General:  Well appearing. No resp difficulty. Sitting in the chair HEENT: normal Neck: supple. JVP 5-6 . Carotids 2+ bilat; no bruits. No lymphadenopathy or thryomegaly appreciated. Cor: Mechanical heart sounds with LVAD hum present. Lungs: clear Abdomen: soft, nontender, nondistended. No hepatosplenomegaly. No bruits or masses. Good bowel sounds. Driveline: C/D/I; securement device intact and driveline incorporated Extremities: no cyanosis, clubbing, rash, edema. RUE PICC Neuro: alert & orientedx3, cranial nerves grossly intact. moves all 4 extremities w/o difficulty. Affect pleasant  Telemetry: SR with PVCs.   Labs: Basic Metabolic Panel:  Recent Labs Lab 03/26/17 1217 03/27/17 0449  NA 134* 138  K 4.9 4.2  CL 105 105  CO2 22 25  GLUCOSE 104* 104*  BUN 33* 29*  CREATININE 1.46* 1.42*  CALCIUM 9.1 8.9    Liver Function Tests:  Recent Labs Lab 03/26/17 1217  AST 29  ALT  24  ALKPHOS 95  BILITOT 1.0  PROT 6.9  ALBUMIN 4.0   No results for input(s): LIPASE, AMYLASE in the last 168 hours. No results for input(s): AMMONIA in the last 168 hours.  CBC:  Recent Labs Lab 03/26/17 1217 03/27/17 0449  WBC 6.7 5.9  HGB 11.2* 10.9*  HCT 35.1* 33.7*  MCV 95.1 94.9  PLT 151 141*    INR:  Recent Labs Lab 03/26/17 03/26/17 1217 03/27/17 0449  INR 2.0 1.91 1.94    Other results:  EKG:   Imaging: Dg Chest 2 View  Result Date: 03/26/2017 CLINICAL DATA:  Left ventricular assistance device. EXAM: CHEST  2 VIEW COMPARISON:  Radiographs of February 20, 2017. FINDINGS: Stable cardiomediastinal silhouette. Atherosclerosis of thoracic aorta is noted. No pneumothorax or pleural effusion is noted. No acute pulmonary disease is noted. Left-sided pacemaker is unchanged in position. Left ventricular assistance device is again noted. Visualized portion of lead appears intact projected over upper abdomen. IMPRESSION: Aortic atherosclerosis. Left ventricular assistance device is again noted. Visualized portion of lead seen over upper abdomen is intact. Electronically Signed   By: Marijo Conception, M.D.   On: 03/26/2017 13:53   Dg Chest Port 1 View  Result Date: 03/26/2017 CLINICAL DATA:  Line placement. EXAM: PORTABLE CHEST 1 VIEW  COMPARISON:  03/26/2017 at 1329 hours FINDINGS: A right PICC has been placed in terminates over the upper SVC. A dual lead ICD and left ventricular assist device remain in place. The cardiac silhouette remains enlarged. Aortic atherosclerosis is noted. No airspace consolidation, overt edema, sizable pleural effusion, or pneumothorax is identified. IMPRESSION: Right PICC terminates over the upper SVC. Electronically Signed   By: Logan Bores M.D.   On: 03/26/2017 20:01   Dg Abd 2 Views  Result Date: 03/26/2017 CLINICAL DATA:  Evaluate for the LVAD lead.  Looking for fracture. EXAM: ABDOMEN - 2 VIEW COMPARISON:  None. FINDINGS: The bowel gas pattern  is normal. There is no evidence of free air. No radio-opaque calculi or other significant radiographic abnormality is seen. Left ventricular assist device is noted. Electronic lead from the power pack to the LVAD device demonstrates no discontinuity. There is slight kinking of the wire 5.4 cm from the insertion on the power pack. Posterior lumbar fusion from L3 through L5. IMPRESSION: Left ventricular assist device is noted. Electronic lead from the power pack to the LVAD device demonstrates no discontinuity. There is slight kinking of the wire 5.4 cm from the insertion on the power pack. Electronically Signed   By: Kathreen Devoid   On: 03/26/2017 13:53      Medications:     Scheduled Medications: . amiodarone  200 mg Oral Daily  . atorvastatin  80 mg Oral q1800  . Chlorhexidine Gluconate Cloth  6 each Topical Daily  . [START ON 04/01/2017] levothyroxine  200 mcg Oral Q Mon  . metoprolol succinate  25 mg Oral BID  . pantoprazole  40 mg Oral BID  . ranolazine  500 mg Oral BID  . sodium chloride flush  10-40 mL Intracatheter Q12H  . sodium chloride flush  3 mL Intravenous Q12H     Infusions: . sodium chloride    . heparin 950 Units/hr (03/26/17 2041)     PRN Medications:  sodium chloride, acetaminophen, docusate sodium, ondansetron (ZOFRAN) IV, sodium chloride flush, sodium chloride flush   Assessment:   1. LVAD Complication 2. Chronic Systolic Heart Failure 3. PAF 4. H/O VT 5. H/O CVA 6. H/O GI bleed 7. H/O Amio induced hyperthyroidism.  8. Chronic  Anticoagulation    Plan/Discussion:    Pump Stop x2 this morning. Log file sent. Will require external lead repair. He is on an ungrounded cable. LDH stable. Will place Epi at the bedside in the event the pump stops.   Keep off coumadin/plavix. Continue heparin. INR 1.94   From HF perspective he is stable. Continue current regimen. Maps soft this morning may need to cut back bb. Will watch.   I reviewed the LVAD parameters  from today, and compared the results to the patient's prior recorded data.  No programming changes were made.  The LVAD is functioning within specified parameters.  The patient performs LVAD self-test daily.  LVAD interrogation was negative for any significant power changes, alarms or PI events/speed drops.  LVAD equipment check completed and is in good working order.  Back-up equipment present.   LVAD education done on emergency procedures and precautions and reviewed exit site care.  Length of Stay: 1  Amy Clegg NP-C  03/27/2017, 7:16 AM  VAD Team --- VAD ISSUES ONLY--- Pager [phone] (7am - 7am)  Advanced Heart Failure Team  Pager [phone] (M-F; 7a - 4p)  Please contact [facility] for night-coverage after hours (4p -7a ) and weekends on [email or web address]  Patient  seen and examined with Darrick Grinder, NP. We discussed all aspects of the encounter. I agree with the assessment and plan as stated above.   Patient has recurrent pump stops overnight. Epi now in room. Seems to have obvious defect in external driveline on x-ray. Have discussed with Dr. Prescott Gum as well as [employer] support team. Will prepare for driveline repair am. Process discussed with patient. HF stable. INR 1.9  Off coumadin/plavix in case need for OR. Continue heparin. D/w Pharm D team   Glori Bickers, MD  1:50 PM

## 2017-03-28 LAB — HEPARIN LEVEL (UNFRACTIONATED)
HEPARIN UNFRACTIONATED: 0.27 [IU]/mL — AB (ref 0.30–0.70)
Heparin Unfractionated: 0.45 IU/mL (ref 0.30–0.70)

## 2017-03-28 LAB — CBC
HEMATOCRIT: 34.9 % — AB (ref 39.0–52.0)
HEMOGLOBIN: 11 g/dL — AB (ref 13.0–17.0)
MCH: 30.4 pg (ref 26.0–34.0)
MCHC: 31.5 g/dL (ref 30.0–36.0)
MCV: 96.4 fL (ref 78.0–100.0)
Platelets: 139 10*3/uL — ABNORMAL LOW (ref 150–400)
RBC: 3.62 MIL/uL — AB (ref 4.22–5.81)
RDW: 16 % — ABNORMAL HIGH (ref 11.5–15.5)
WBC: 5.3 10*3/uL (ref 4.0–10.5)

## 2017-03-28 LAB — BASIC METABOLIC PANEL
Anion gap: 8 (ref 5–15)
BUN: 28 mg/dL — AB (ref 6–20)
CHLORIDE: 108 mmol/L (ref 101–111)
CO2: 22 mmol/L (ref 22–32)
Calcium: 8.8 mg/dL — ABNORMAL LOW (ref 8.9–10.3)
Creatinine, Ser: 1.43 mg/dL — ABNORMAL HIGH (ref 0.61–1.24)
GFR calc Af Amer: 53 mL/min — ABNORMAL LOW (ref >60)
GFR calc non Af Amer: 46 mL/min — ABNORMAL LOW (ref >60)
GLUCOSE: 109 mg/dL — AB (ref 65–99)
POTASSIUM: 5 mmol/L (ref 3.5–5.1)
SODIUM: 138 mmol/L (ref 135–145)

## 2017-03-28 LAB — LACTATE DEHYDROGENASE: LDH: 288 U/L — ABNORMAL HIGH (ref 98–192)

## 2017-03-28 LAB — PROTIME-INR
INR: 1.67
Prothrombin Time: 19.9 seconds — ABNORMAL HIGH (ref 11.4–15.2)

## 2017-03-28 MED ORDER — WARFARIN SODIUM 5 MG PO TABS
5.0000 mg | ORAL_TABLET | Freq: Once | ORAL | Status: AC
  Administered 2017-03-28: 5 mg via ORAL
  Filled 2017-03-28: qty 1

## 2017-03-28 MED ORDER — LEVOTHYROXINE SODIUM 100 MCG PO TABS
200.0000 ug | ORAL_TABLET | ORAL | Status: DC
  Administered 2017-03-28 – 2017-03-29 (×2): 200 ug via ORAL
  Filled 2017-03-28 (×2): qty 2

## 2017-03-28 MED ORDER — WARFARIN - PHARMACIST DOSING INPATIENT: Freq: Every day | Status: DC

## 2017-03-28 MED ORDER — LEVOTHYROXINE SODIUM 100 MCG PO TABS: 300.0000 ug | ORAL_TABLET | ORAL | Status: DC

## 2017-03-28 MED ORDER — PANCRELIPASE (LIP-PROT-AMYL) 12000-38000 UNITS PO CPEP: 12000.0000 [IU] | ORAL_CAPSULE | Freq: Once | ORAL | Status: DC

## 2017-03-28 MED ORDER — SODIUM CHLORIDE 0.9 % IV BOLUS (SEPSIS)
1000.0000 mL | Freq: Once | INTRAVENOUS | Status: AC
  Administered 2017-03-28: 1000 mL via INTRAVENOUS

## 2017-03-28 MED ORDER — SODIUM BICARBONATE 650 MG PO TABS: 650.0000 mg | ORAL_TABLET | Freq: Once | ORAL | Status: DC

## 2017-03-28 NOTE — Progress Notes (Signed)
Paged for low MAPs  Checked in room and Map in 50s (53), having more PI events. No other alarms. Asymptomatic.  Will give 1 L fluid.  MD aware   Beryle Beams" [place], PA-C 03/28/2017 3:52 PM

## 2017-03-28 NOTE — Progress Notes (Signed)
HeartMate 2 Rounding Note  Subjective:    Admitted with LVAD pump stop. Off coumadin and plavix. Started on heparin.   No further pump stops now on ungrounded cable.  Expecting [employer] engineers ~0900 this am.  No further nausea.  Denies SOB.   INR 1.67. LDH 288  LVAD INTERROGATION:  HeartMate II LVAD:  Flow 3.9 liters/min, speed 8800, power 4.5, PI 6.4 - No further pump stops since yesterday am.  7 PI events. On ungrounded cable   Objective:    Vital Signs:   Temp:  [97.5 F (36.4 C)-97.9 F (36.6 C)] 97.9 F (36.6 C) (05/24 0300) Pulse Rate:  [46-69] 50 (05/24 0600) Resp:  [10-21] 11 (05/24 0600) BP: (81-120)/(63-84) 98/68 (05/24 0600) SpO2:  [91 %-100 %] 94 % (05/24 0600) Weight:  [198 lb 8 oz (90 kg)] 198 lb 8 oz (90 kg) (05/24 0500) Last BM Date: 03/27/17 Mean arterial Pressure 76  Intake/Output:   Intake/Output Summary (Last 24 hours) at 03/28/17 0736 Last data filed at 03/28/17 0600  Gross per 24 hour  Intake           1234.5 ml  Output             2175 ml  Net           -940.5 ml     Physical Exam: GENERAL: Well appearing this am. NAD.  HEENT: Normal. NECK: Supple, JVP 6-7cm. Carotids OK.  CARDIAC:  Mechanical heart sounds with LVAD hum present.  LUNGS:  CTAB, normal effort.  ABDOMEN:  NT, ND, no HSM. No bruits or masses. +BS  LVAD exit site: Well-healed and incorporated. Dressing dry and intact. No erythema or drainage. Stabilization device present and accurately applied. Driveline dressing changed daily per sterile technique. EXTREMITIES:  Warm and dry. No cyanosis, clubbing, rash, or edema. RUE PICC in tact. NEUROLOGIC:  Alert & oriented x 3. Cranial nerves grossly intact. Moves all 4 extremities w/o difficulty. Affect pleasant     Telemetry: Personally reviewed, NSR 60s  Labs: Basic Metabolic Panel:  Recent Labs Lab 03/26/17 1217 03/27/17 0449 03/28/17 0549  NA 134* 138 138  K 4.9 4.2 5.0  CL 105 105 108  CO2 22 25 22   GLUCOSE 104* 104*  109*  BUN 33* 29* 28*  CREATININE 1.46* 1.42* 1.43*  CALCIUM 9.1 8.9 8.8*    Liver Function Tests:  Recent Labs Lab 03/26/17 1217  AST 29  ALT 24  ALKPHOS 95  BILITOT 1.0  PROT 6.9  ALBUMIN 4.0   No results for input(s): LIPASE, AMYLASE in the last 168 hours. No results for input(s): AMMONIA in the last 168 hours.  CBC:  Recent Labs Lab 03/26/17 1217 03/27/17 0449 03/28/17 0549  WBC 6.7 5.9 5.3  HGB 11.2* 10.9* 11.0*  HCT 35.1* 33.7* 34.9*  MCV 95.1 94.9 96.4  PLT 151 141* 139*    INR:  Recent Labs Lab 03/26/17 03/26/17 1217 03/27/17 0449 03/28/17 0549  INR 2.0 1.91 1.94 1.67    Other results:  EKG:   Imaging: Dg Chest 2 View  Result Date: 03/26/2017 CLINICAL DATA:  Left ventricular assistance device. EXAM: CHEST  2 VIEW COMPARISON:  Radiographs of February 20, 2017. FINDINGS: Stable cardiomediastinal silhouette. Atherosclerosis of thoracic aorta is noted. No pneumothorax or pleural effusion is noted. No acute pulmonary disease is noted. Left-sided pacemaker is unchanged in position. Left ventricular assistance device is again noted. Visualized portion of lead appears intact projected over upper abdomen. IMPRESSION: Aortic atherosclerosis.  Left ventricular assistance device is again noted. Visualized portion of lead seen over upper abdomen is intact. Electronically Signed   By: Marijo Conception, M.D.   On: 03/26/2017 13:53   Dg Chest Port 1 View  Result Date: 03/26/2017 CLINICAL DATA:  Line placement. EXAM: PORTABLE CHEST 1 VIEW COMPARISON:  03/26/2017 at 1329 hours FINDINGS: A right PICC has been placed in terminates over the upper SVC. A dual lead ICD and left ventricular assist device remain in place. The cardiac silhouette remains enlarged. Aortic atherosclerosis is noted. No airspace consolidation, overt edema, sizable pleural effusion, or pneumothorax is identified. IMPRESSION: Right PICC terminates over the upper SVC. Electronically Signed   By: Logan Bores  M.D.   On: 03/26/2017 20:01   Dg Abd 2 Views  Result Date: 03/26/2017 CLINICAL DATA:  Evaluate for the LVAD lead.  Looking for fracture. EXAM: ABDOMEN - 2 VIEW COMPARISON:  None. FINDINGS: The bowel gas pattern is normal. There is no evidence of free air. No radio-opaque calculi or other significant radiographic abnormality is seen. Left ventricular assist device is noted. Electronic lead from the power pack to the LVAD device demonstrates no discontinuity. There is slight kinking of the wire 5.4 cm from the insertion on the power pack. Posterior lumbar fusion from L3 through L5. IMPRESSION: Left ventricular assist device is noted. Electronic lead from the power pack to the LVAD device demonstrates no discontinuity. There is slight kinking of the wire 5.4 cm from the insertion on the power pack. Electronically Signed   By: Kathreen Devoid   On: 03/26/2017 13:53     Medications:     Scheduled Medications: . amiodarone  200 mg Oral Daily  . atorvastatin  80 mg Oral q1800  . Chlorhexidine Gluconate Cloth  6 each Topical Daily  . levothyroxine  200 mcg Oral Once per day on Sun Tue Wed Thu Fri Sat  . [START ON 04/01/2017] levothyroxine  300 mcg Oral Q Mon  . metoprolol succinate  25 mg Oral BID  . pantoprazole  40 mg Oral BID  . ranolazine  500 mg Oral BID  . sodium chloride flush  10-40 mL Intracatheter Q12H  . sodium chloride flush  3 mL Intravenous Q12H    Infusions: . sodium chloride 10 mL/hr (03/27/17 1042)  . heparin 1,000 Units/hr (03/28/17 0647)    PRN Medications: sodium chloride, acetaminophen, docusate sodium, ondansetron (ZOFRAN) IV, sodium chloride flush, sodium chloride flush   Assessment:   1. LVAD Complication 2. Chronic Systolic Heart Failure 3. PAF 4. H/O VT 5. H/O CVA 6. H/O GI bleed 7. H/O Amio induced hyperthyroidism.  8. Chronic  Anticoagulation    Plan/Discussion:    No further pump stop since x 2 yesterday morning. Log files sent. Engineers en-route for  external lead repair this am.   PICC line in place and Epi at bedside in the event pump stops.   Keep off coumadin/plavix. Continue heparin. INR 1.67 this am. LDH 288  Remains stable from HF perspective. Continue current regimen. MAPs stable. Can cut back BB if drops.   I reviewed the LVAD parameters from today, and compared the results to the patient's prior recorded data.  No programming changes were made.  The LVAD is functioning within specified parameters.  The patient performs LVAD self-test daily.  LVAD interrogation was negative for alarms or PI events/speed drops.  LVAD equipment check completed and is in good working order.  Back-up equipment present.   LVAD education done on  emergency procedures and precautions and reviewed exit site care.   Length of Stay: 2  Annamaria Helling  03/28/2017, 7:36 AM  VAD Team --- VAD ISSUES ONLY--- Pager [phone] (7am - 7am)  Advanced Heart Failure Team  Pager [phone] (M-F; 7a - 4p)  Please contact [facility] for night-coverage after hours (4p -7a ) and weekends on [email or web address]  Patient seen and examined with the above-signed Advanced Practice Provider and/or Housestaff. I personally reviewed laboratory data, imaging studies and relevant notes. I independently examined the patient and formulated the important aspects of the plan. I have edited the note to reflect any of my changes or salient points. I have personally discussed the plan with the patient and/or family.  He underwent successful driveline repair this am. Feeling good. No HF. Will resume heparin/coumadin. MAP stable. Parameters stable.   Glori Bickers, MD  1:34 PM

## 2017-03-28 NOTE — Progress Notes (Signed)
ANTICOAGULATION CONSULT NOTE - Follow Up Consult  Pharmacy Consult for heparin Indication: Afib and LVAD  Labs:  Recent Labs  03/26/17 1217  03/27/17 0449 03/27/17 1400 03/28/17 0549  HGB 11.2*  --  10.9*  --  11.0*  HCT 35.1*  --  33.7*  --  34.9*  PLT 151  --  141*  --  139*  LABPROT 22.1*  --  22.4*  --  19.9*  INR 1.91  --  1.94  --  1.67  HEPARINUNFRC  --   < > 0.32 0.37 0.27*  CREATININE 1.46*  --  1.42*  --   --   < > = values in this interval not displayed.   Assessment: 76yo male now subtherapeutic on heparin after two levels at goal, previously therapeutic at this rate, no issues w/ gtt per gtt.  Goal of Therapy:  Heparin level 0.3-0.5 units/ml   Plan:  Will increase heparin gtt very slightly to 1000 units/h and check level in [place], PharmD, BCPS  03/28/2017,6:47 AM

## 2017-03-28 NOTE — Progress Notes (Signed)
Driveline repair completed at 11:20.  Pt. To stay on grounded cable for 24 hours.  Pt. Is in chair and comfortable.  Will be monitoring closely.

## 2017-03-28 NOTE — Progress Notes (Signed)
ANTICOAGULATION CONSULT NOTE - Initial Consult  Pharmacy Consult for heparin and warfarin Indication: atrial fibrillation and LVAD  Allergies  Allergen Reactions  . Demerol [Meperidine] Other (See Comments)    Paralysis. Could only move eyes. Paralysis. Could only move eyes.    Patient Measurements: Weight: 198 lb 8 oz (90 kg)  Vital Signs: Temp: 97.7 F (36.5 C) (05/24 1252) Temp Source: Oral (05/24 1252) BP: 112/84 (05/24 1200) Pulse Rate: 59 (05/24 1200)  Labs:  Recent Labs  03/26/17 1217  03/27/17 0449 03/27/17 1400 03/28/17 0549  HGB 11.2*  --  10.9*  --  11.0*  HCT 35.1*  --  33.7*  --  34.9*  PLT 151  --  141*  --  139*  LABPROT 22.1*  --  22.4*  --  19.9*  INR 1.91  --  1.94  --  1.67  HEPARINUNFRC  --   < > 0.32 0.37 0.27*  CREATININE 1.46*  --  1.42*  --  1.43*  < > = values in this interval not displayed.  Estimated Creatinine Clearance: 47.5 mL/min (A) (by C-G formula based on SCr of 1.43 mg/dL (H)).   Medical History: Past Medical History:  Diagnosis Date  . Arthritis   . Automatic implantable cardioverter-defibrillator in situ    a. s/p [employer] Evera device serial [id number]    . Bradycardia   . CAD (coronary artery disease)    a. s/p MI 1982;  s/p DES to CFX 2011;  s/p NSTEMI 4/12 in setting of VTach;  cath 7/12:  LM ok, LAD mid 30-40%, Dx's with 40%, mCFX stent patent, prox to mid RCA occluded with R to R and L to R collats.  Medical Tx was continued  . Chronic systolic heart failure ([place])    a. s/p LVAD 10/2013 for destination therapy  . CKD (chronic kidney disease)   . CVD (cerebrovascular disease)    a. s/p L CEA 2008  . Cytopenia   . Dysrhythmia    AFIB  . History of GI bleed    a. on ASA- started on plavix during 10/2015 admission for CVA  . HLD (hyperlipidemia)   . HTN (hypertension)   . Hypothyroidism   . Inappropriate therapy from implantable cardioverter-defibrillator    a. ATP for sinus tach SVT wavelet identified SVT but  was no  passive (2014)  . Ischemic cardiomyopathy   . 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 L Ear   . Myocardial infarction ([place])    1992  . PAF (paroxysmal atrial fibrillation) ([place])   . PVD (peripheral vascular disease) ([place])    a. 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 Shortness of breath dyspnea    W/ EXERTION   . Sleep apnea    NO CPAP NOW  HE SENT IT BACK YRS AGO  . Stroke [facility])    a. 10/2015 admission   . Ventricular tachycardia ([place])    a. s/p AICD;   h/o ICD shock;  Amiodarone Rx. S/P ablation in 2012   Assessment: 1 YOM admitted with possible external lead fracture to his LVAD. He has had 3 pump stops over the last 24 hours before admission and 2 pump stops on 5/23 inpatient. Repair performed 5/24 and warfarin is now restarted with plan to bridge with heparin until INR is >1.8. -HL 0.45 therapeutic -INR  1.67 today (admit 1.91), goal 2-2.5 -Hgb 11.0 stable, Plts 139 stable -Home warfarin: 2mg  MW, 4mg  all other days  Goal of Therapy:  INR 2-2.5 HL 0.3-0.5 Monitor platelets by anticoagulation protocol: Yes   Plan:  -Continue heparin at 1000 units/hr (no boluses with recent GIB last month), continue until INR>1.8 -Warfarin 5mg  x1 tonight -Goal HL 0.3-0.5 -Daily CBC/HL/INR -Monitor S/Sx bleeding  Myer Peer Grayland Ormond), PharmD  PGY1 Pharmacy Resident Pager: [phone] 03/28/2017 1:25 PM

## 2017-03-28 NOTE — Progress Notes (Signed)
Admitted 03/26/17 due to  external lead fracture. Percuatneous lead repair performed by [employer] 03/28/17.  HeartMate II LVAD implated on 10/19/2013 by Dr. Cyndia Bent.   Vital signs: HR: 61 Doppler Pressure:80 Automatic BP: 95/73 (79) O2 Sat: 98 on RA Wt:not obtained at this time     LVAD interrogation reveals:  Speed:8800 Flow: 3.9 Power:  4.5 PI: 6.4 Alarms: no pump alarms within 24 hours Events:  5-10 PI events daily Fixed speed: 8800 Low speed limit: 8200  Drive Line: Dressing unremarkable. Changed by [name] weekly.  Labs:  LDH trend:270>251>288  INR trend: 1.9>1.94>1.67  Anticoagulation Plan: -INR Goal: 2-2.5, Coumadin to be restarted today  -ASA Dose: none. Stopped due to GIB history.  Adverse Events on VAD: 02/2015 >>GIB (removed ASA, 2-2.5 INR) 10/2015 >>CVA(Plavix started) 02/2017> GIB (removed ASA and Plavix, scopes negative)  Plan/Recommendations:   1. Patient is to remain on the grounded cable (black) for 24 hours. Notify VAD pager if any alarms.   2. The orange (non-grounded) power module cable on the VAD cart is to be used if instructed by the VAD team ONLY.  3. Dressings changed by [name], girlfriend, weekly.  Balinda Quails RN, VAD Coordinator 24/7 pager [phone]

## 2017-03-29 ENCOUNTER — Encounter (HOSPITAL_COMMUNITY): Payer: Self-pay | Admitting: *Deleted

## 2017-03-29 ENCOUNTER — Encounter (HOSPITAL_COMMUNITY)
Admission: AD | Disposition: A | Discharge status: Home / Self Care | Payer: Self-pay | Source: Ambulatory Visit | Attending: Internal Medicine

## 2017-03-29 LAB — BASIC METABOLIC PANEL
Anion gap: 7 (ref 5–15)
BUN: 23 mg/dL — AB (ref 6–20)
CHLORIDE: 107 mmol/L (ref 101–111)
CO2: 25 mmol/L (ref 22–32)
CREATININE: 1.41 mg/dL — AB (ref 0.61–1.24)
Calcium: 8.6 mg/dL — ABNORMAL LOW (ref 8.9–10.3)
GFR calc Af Amer: 54 mL/min — ABNORMAL LOW (ref >60)
GFR calc non Af Amer: 47 mL/min — ABNORMAL LOW (ref >60)
GLUCOSE: 111 mg/dL — AB (ref 65–99)
POTASSIUM: 4.9 mmol/L (ref 3.5–5.1)
Sodium: 139 mmol/L (ref 135–145)

## 2017-03-29 LAB — CBC
HCT: 32.2 % — ABNORMAL LOW (ref 39.0–52.0)
HEMOGLOBIN: 10.2 g/dL — AB (ref 13.0–17.0)
MCH: 30.9 pg (ref 26.0–34.0)
MCHC: 31.7 g/dL (ref 30.0–36.0)
MCV: 97.6 fL (ref 78.0–100.0)
PLATELETS: 148 10*3/uL — AB (ref 150–400)
RBC: 3.3 MIL/uL — AB (ref 4.22–5.81)
RDW: 16.3 % — ABNORMAL HIGH (ref 11.5–15.5)
WBC: 4.6 10*3/uL (ref 4.0–10.5)

## 2017-03-29 LAB — LACTATE DEHYDROGENASE: LDH: 239 U/L — AB (ref 98–192)

## 2017-03-29 LAB — HEPARIN LEVEL (UNFRACTIONATED): Heparin Unfractionated: 0.44 IU/mL (ref 0.30–0.70)

## 2017-03-29 LAB — PROTIME-INR
INR: 1.49
PROTHROMBIN TIME: 18.1 s — AB (ref 11.4–15.2)

## 2017-03-29 SURGERY — INSERTION OF IMPLANTABLE LEFT VENTRICULAR ASSIST DEVICE
Anesthesia: General | Site: Chest

## 2017-03-29 MED ORDER — WARFARIN SODIUM 5 MG PO TABS: 5.0000 mg | ORAL_TABLET | Freq: Once | ORAL | Status: DC

## 2017-03-29 MED ORDER — ENOXAPARIN SODIUM 60 MG/0.6ML ~~LOC~~ SOLN
50.0000 mg | Freq: Two times a day (BID) | SUBCUTANEOUS | Status: DC
  Administered 2017-03-29: 50 mg via SUBCUTANEOUS
  Filled 2017-03-29: qty 0.6

## 2017-03-29 MED ORDER — WARFARIN SODIUM 5 MG PO TABS
5.0000 mg | ORAL_TABLET | ORAL | Status: DC | PRN
  Filled 2017-03-29: qty 1

## 2017-03-29 MED ORDER — METOPROLOL TARTRATE 12.5 MG HALF TABLET
12.5000 mg | ORAL_TABLET | Freq: Two times a day (BID) | ORAL | Status: DC
  Administered 2017-03-29: 12.5 mg via ORAL
  Filled 2017-03-29: qty 1

## 2017-03-29 MED ORDER — ENOXAPARIN SODIUM 60 MG/0.6ML ~~LOC~~ SOLN: 50.0000 mg | Freq: Two times a day (BID) | SUBCUTANEOUS | 0 refills | Status: DC

## 2017-03-29 MED ORDER — FUROSEMIDE 10 MG/ML IJ SOLN: 40.0000 mg | Freq: Once | INTRAMUSCULAR | Status: DC

## 2017-03-29 MED FILL — ENOXAPARIN 60 MG/0.6 ML SYR: 60 | 5 days supply | Qty: 6 | Fill #0

## 2017-03-29 NOTE — Progress Notes (Deleted)
Advanced Heart Failure Team  Discharge Summary   Patient ID: [name] MRN: [id number], DOB/AGE: 1940-12-25 76 y.o. Admit date: 03/26/2017 D/C date:     03/29/2017   Primary Discharge Diagnoses:  1. LVAD Complication 2. Chronic systolic HF 3. PAF 4. H/o VT 5. H/o CVA 6. H/o GI bleed 7. H/o amio induced hyperthyroidism 8. Chronic anticoagulation  Hospital Course:   [name] is a 76 y.o. 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   Pt presented to [facility] 03/26/17 with low flow alarms and nausea that occurred lying on either side.  Overall felt OK otherwise. Labwork negative but on LVAD interrogation noted to have 3 pump stops x 24 hrs.  Chest and ABD x rays checked and log file sent to [facility].  Placed on battery x 1 and left on grounded table to monitor for further stops. PICC placed and Epi kept at bedside.  Coumadin and plavix held in event pt needed pump exchange.   Pt had 2 additional pump stops overnight into 03/27/17. Xray with external defect in driveline. [facility] contacted and engineers dispatched.  Pt underwent external driveline repair.   Pt underwent driveline repair at bedside 02/12/80 without complication.  Hospital course complicated by PI events and hypotension.  Meds adjusted and given IVF with rapid improvement. Not thought to be related to driveline repair, more likely dietary changes from home > hospital.   Pt examined am of 03/29/17 and thought stable for home. INR subtherapeutic with holding for possible surgery.  After discussion with family and pharmacy, pt and providers OK with going home with lovenox bridge. He will check INR on Sunday with his home device and text to VAD coordinator.   Pt will be discharged home in stable condition with follow up as below.   LVAD Interrogation HM II:   Speed: 8800 Flow: 3.5 PI: 6.9 Power: 4 Back-up speed: 8200  Discharge Weight: 202 lbs Discharge Vitals:  Blood pressure 105/81, pulse (!) 58, temperature 97.9 F (36.6 C), temperature source Oral, resp. rate 13, height 6' (1.829 m), weight 202 lb 9.6 oz (91.9 kg), SpO2 98 %.  Labs: Lab Results  Component Value Date   WBC 4.6 03/29/2017   HGB 10.2 (L) 03/29/2017   HCT 32.2 (L) 03/29/2017   MCV 97.6 03/29/2017   PLT 148 (L) 03/29/2017    Recent Labs Lab 03/26/17 1217  03/29/17 0254  NA 134*  < > 139  K 4.9  < > 4.9  CL 105  < > 107  CO2 22  < > 25  BUN 33*  < > 23*  CREATININE 1.46*  < > 1.41*  CALCIUM 9.1  < > 8.6*  PROT 6.9  --   --   BILITOT 1.0  --   --   ALKPHOS 95  --   --   ALT 24  --   --   AST 29  --   --   GLUCOSE 104*  < > 111*  < > = values in this interval not displayed. Lab Results  Component Value Date   CHOL 106 11/05/2015   HDL 33 (L) 11/05/2015   LDLCALC 60 11/05/2015   TRIG 67 11/05/2015   BNP (last 3 results)  Recent Labs  07/18/16 2253  BNP 1,289.7*    ProBNP (last 3 results) No results for input(s): PROBNP in the last 8760 hours.   Diagnostic Studies/Procedures  No results found.  Discharge Medications   Allergies as of 03/29/2017      Reactions   Demerol [meperidine] Other (See Comments)   Paralysis. Could only move eyes. Paralysis. Could only move eyes.      Medication List    TAKE these medications   amiodarone 200 MG tablet Commonly known as:  PACERONE Take 1 tablet (200 mg total) by mouth daily.   atorvastatin 80 MG tablet Commonly known as:  LIPITOR Take 1 tablet (80 mg total) by mouth daily at 6 PM.   clopidogrel 75 MG tablet Commonly known as:  PLAVIX Take 1 tablet (75 mg total) by mouth daily.   docusate sodium 100 MG capsule Commonly known as:  COLACE Take 200 mg by mouth daily as needed for mild constipation.   enoxaparin 60 MG/0.6ML injection Commonly known as:  LOVENOX Inject 0.5 mLs (50 mg total) into the skin every 12 (twelve) hours.   furosemide 20 MG tablet Commonly known as:  LASIX Take 1 tablet  (20 mg total) by mouth as needed for edema (weight gain).   ICAPS PO Take 1 tablet by mouth 2 (two) times daily.   levothyroxine 200 MCG tablet Commonly known as:  SYNTHROID, LEVOTHROID TAKE 1 TABLET DAILY BEFORE BREAKFAST WITH 1 & 1/2 TABLETS EVERY MONDAY. What changed:  how much to take  how to take this  when to take this  additional instructions   metoprolol succinate 25 MG 24 hr tablet Commonly known as:  TOPROL-XL Take 1 tablet (25 mg total) by mouth 2 (two) times daily.   nitroGLYCERIN 0.4 MG SL tablet Commonly known as:  NITROSTAT Place 0.4 mg under the tongue every 5 (five) minutes as needed for chest pain.   pantoprazole 40 MG tablet Commonly known as:  PROTONIX TAKE 1 TABLET TWICE DAILY What changed:  See the new instructions.   promethazine 12.5 MG tablet Commonly known as:  PHENERGAN Take 12.5 mg by mouth every 6 (six) hours as needed for nausea or vomiting.   ranolazine 500 MG 12 hr tablet Commonly known as:  RANEXA TAKE 1 TABLET (500 MG TOTAL) BY MOUTH 2 (TWO) TIMES DAILY.   warfarin 2 MG tablet Commonly known as:  COUMADIN Take 2 mg on Mon / Wed / Fri . Take 4 mg on Tues / Thurs/ Sat / Sun What changed:  how much to take  additional instructions   zinc gluconate 50 MG tablet Take 50 mg by mouth 2 (two) times daily.       Disposition   The patient will be discharged in stable condition to home. Discharge Instructions    (HEART FAILURE PATIENTS) Call MD:  Anytime you have any of the following symptoms: 1) 3 pound weight gain in 24 hours or 5 pounds in 1 week 2) shortness of breath, with or without a dry hacking cough 3) swelling in the hands, feet or stomach 4) if you have to sleep on extra pillows at night in order to breathe.    Complete by:  As directed    Diet - low sodium heart healthy    Complete by:  As directed    Increase activity slowly    Complete by:  As directed      Follow-up Information    [facility] Follow up on 04/04/2017.   Specialty:  Cardiology Why:  at 1100 am for post hospital VAD check.  Code for parking is 6001. Contact information: [address]  [address] [id number] [place] [phone]            Duration of Discharge Encounter: Greater than 35 minutes   Signed, Annamaria Helling  03/29/2017, 12:25 PM

## 2017-03-29 NOTE — Care Management Note (Signed)
Case Management Note Marvetta Gibbons RN, BSN Unit 2W-Case Manager-- [place] coverage [phone]  Patient Details  Name: [name] MRN: [id number] Date of Birth: 04-24-1941  Subjective/Objective:  LVAD pt presenting with pump stops- started on IV heparin- will require lead revision per MD note                  Action/Plan: PTA pt lived at home - CM to follow for d/c needs  Expected Discharge Date:  03/29/17               Expected Discharge Plan:  [place]  In-House Referral:     Discharge planning Services  CM Consult  Post Acute Care Choice:    Choice offered to:     DME Arranged:    DME Agency:     HH Arranged:    [place]:     Status of Service:  Completed, signed off  If discussed at [employer] of Stay Meetings, dates discussed:    Discharge Disposition: home/self care   Additional Comments:  03/29/17- 1240- Marvetta Gibbons RN, CM- pt for d/c home today- no CM needs noted- VAD team to follow  Dawayne Patricia, RN 03/29/2017, 12:43 PM

## 2017-03-29 NOTE — Progress Notes (Signed)
Assessment unchanged. Discussed D/C instructions with pt including f/u appointments and new medications. Discussed need to check INR on 5/26 and report to VAD Coordinator. Pt and family verbalized understanding. RX called into pharmacy. PICC removed by IV Team. Pt left with belongings accompanied by RN.

## 2017-03-29 NOTE — Progress Notes (Signed)
ANTICOAGULATION CONSULT NOTE - Initial Consult  Pharmacy Consult for heparin and warfarin Indication: atrial fibrillation and LVAD  Allergies  Allergen Reactions  . Demerol [Meperidine] Other (See Comments)    Paralysis. Could only move eyes. Paralysis. Could only move eyes.    Patient Measurements: Weight: 198 lb 8 oz (90 kg)  Vital Signs: Temp: 97.6 F (36.4 C) (05/25 0300) Temp Source: Oral (05/25 0300) BP: 92/68 (05/25 0600) Pulse Rate: 59 (05/25 0600)  Labs:  Recent Labs  03/27/17 0449  03/28/17 0549 03/28/17 1449 03/29/17 0254  HGB 10.9*  --  11.0*  --  10.2*  HCT 33.7*  --  34.9*  --  32.2*  PLT 141*  --  139*  --  148*  LABPROT 22.4*  --  19.9*  --  18.1*  INR 1.94  --  1.67  --  1.49  HEPARINUNFRC 0.32  < > 0.27* 0.45 0.44  CREATININE 1.42*  --  1.43*  --  1.41*  < > = values in this interval not displayed.  Estimated Creatinine Clearance: 48.2 mL/min (A) (by C-G formula based on SCr of 1.41 mg/dL (H)).   Medical History: Past Medical History:  Diagnosis Date  . Arthritis   . Automatic implantable cardioverter-defibrillator in situ    a. s/p Medtronic Evera device serial [id number]    . Bradycardia   . CAD (coronary artery disease)    a. s/p MI 1982;  s/p DES to CFX 2011;  s/p NSTEMI 4/12 in setting of VTach;  cath 7/12:  LM ok, LAD mid 30-40%, Dx's with 40%, mCFX stent patent, prox to mid RCA occluded with R to R and L to R collats.  Medical Tx was continued  . Chronic systolic heart failure ([place])    a. s/p LVAD 10/2013 for destination therapy  . CKD (chronic kidney disease)   . CVD (cerebrovascular disease)    a. s/p L CEA 2008  . Cytopenia   . Dysrhythmia    AFIB  . History of GI bleed    a. on ASA- started on plavix during 10/2015 admission for CVA  . HLD (hyperlipidemia)   . HTN (hypertension)   . Hypothyroidism   . Inappropriate therapy from implantable cardioverter-defibrillator    a. ATP for sinus tach SVT wavelet identified SVT but  was no  passive (2014)  . Ischemic cardiomyopathy   . 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 L Ear   . Myocardial infarction ([place])    1992  . PAF (paroxysmal atrial fibrillation) ([place])   . PVD (peripheral vascular disease) ([place])    a. 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 Shortness of breath dyspnea    W/ EXERTION   . Sleep apnea    NO CPAP NOW  HE SENT IT BACK YRS AGO  . Stroke [facility])    a. 10/2015 admission   . Ventricular tachycardia ([place])    a. s/p AICD;   h/o ICD shock;  Amiodarone Rx. S/P ablation in 2012   Assessment: 39 YOM admitted with possible external lead fracture to his LVAD. He has had 3 pump stops over the last 24 hours before admission and 2 pump stops on 5/23 inpatient. Repair performed 5/24 and warfarin is now restarted with plan to bridge with heparin until INR is >1.8. -HL 0.44 therapeutic -INR 1.67>>1.49 (admit  1.91), goal 2-2.5 -Hgb 10.2 stable, Plts 148 stable -Home warfarin: 2mg  MW, 4mg  all other days  Goal of Therapy:  INR 2-2.5 HL 0.3-0.5 Monitor platelets by anticoagulation protocol: Yes   Plan:  -Continue heparin at 1000 units/hr (no boluses with recent GIB last month), continue until INR>1.8 -Warfarin 5mg  x1 tonight -Goal HL 0.3-0.5 -Daily CBC/HL/INR -Monitor S/Sx bleeding  Myer Peer Grayland Ormond), PharmD  PGY1 Pharmacy Resident Pager: [phone] 03/29/2017 6:20 AM

## 2017-03-29 NOTE — Progress Notes (Signed)
ANTICOAGULATION CONSULT NOTE - Initial Consult  Pharmacy Consult for heparin and warfarin Indication: atrial fibrillation and LVAD  Allergies  Allergen Reactions  . Demerol [Meperidine] Other (See Comments)    Paralysis. Could only move eyes. Paralysis. Could only move eyes.    Patient Measurements: Height: 6' (182.9 cm) Weight: 202 lb 9.6 oz (91.9 kg) IBW/kg (Calculated) : 77.6  Vital Signs: Temp: 97.9 F (36.6 C) (05/25 0815) Temp Source: Oral (05/25 0815) BP: 118/86 (05/25 0800) Pulse Rate: 59 (05/25 0800)  Labs:  Recent Labs  03/27/17 0449  03/28/17 0549 03/28/17 1449 03/29/17 0254  HGB 10.9*  --  11.0*  --  10.2*  HCT 33.7*  --  34.9*  --  32.2*  PLT 141*  --  139*  --  148*  LABPROT 22.4*  --  19.9*  --  18.1*  INR 1.94  --  1.67  --  1.49  HEPARINUNFRC 0.32  < > 0.27* 0.45 0.44  CREATININE 1.42*  --  1.43*  --  1.41*  < > = values in this interval not displayed.  Estimated Creatinine Clearance: 48.2 mL/min (A) (by C-G formula based on SCr of 1.41 mg/dL (H)).   Medical History: Past Medical History:  Diagnosis Date  . Arthritis   . Automatic implantable cardioverter-defibrillator in situ    a. s/p Medtronic Evera device serial [id number]    . Bradycardia   . CAD (coronary artery disease)    a. s/p MI 1982;  s/p DES to CFX 2011;  s/p NSTEMI 4/12 in setting of VTach;  cath 7/12:  LM ok, LAD mid 30-40%, Dx's with 40%, mCFX stent patent, prox to mid RCA occluded with R to R and L to R collats.  Medical Tx was continued  . Chronic systolic heart failure ([place])    a. s/p LVAD 10/2013 for destination therapy  . CKD (chronic kidney disease)   . CVD (cerebrovascular disease)    a. s/p L CEA 2008  . Cytopenia   . Dysrhythmia    AFIB  . History of GI bleed    a. on ASA- started on plavix during 10/2015 admission for CVA  . HLD (hyperlipidemia)   . HTN (hypertension)   . Hypothyroidism   . Inappropriate therapy from implantable cardioverter-defibrillator     a. ATP for sinus tach SVT wavelet identified SVT but was no  passive (2014)  . Ischemic cardiomyopathy   . 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 Myocardial infarction ([place])    1992  . PAF (paroxysmal atrial fibrillation) ([place])   . PVD (peripheral vascular disease) ([place])    a. 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 Shortness of breath dyspnea    W/ EXERTION   . Sleep apnea    NO CPAP NOW  HE SENT IT BACK YRS AGO  . Stroke [facility])    a. 10/2015 admission   . Ventricular tachycardia ([place])    a. s/p AICD;   h/o ICD shock;  Amiodarone Rx. S/P ablation in 2012   Assessment: [name] admitted with possible external lead fracture to his LVAD. He has had 3 pump stops over the last 24 hours before admission and 2 pump stops on 5/23 inpatient. Repair performed 5/24 and warfarin is now restarted with plan to bridge with heparin until INR  is >1.8. -HL 0.44 therapeutic -INR 1.67>>1.49 (admit 1.91), goal 2-2.5 -Hgb 10.2 stable, Plts 148 stable -Home warfarin: 2mg  MW, 4mg  all other days  Goal of Therapy:  INR 2-2.5 HL 0.3-0.5 Monitor platelets by anticoagulation protocol: Yes   Plan:  -Continue heparin at 1000 units/hr (no boluses with recent GIB last month), continue until INR>1.8 -Warfarin 5mg  x1 tonight -Goal HL 0.3-0.5 -Daily CBC/HL/INR -Monitor S/Sx bleeding  [name]), PharmD  PGY1 Pharmacy Resident Pager: [phone] 03/29/2017 9:41 AM   Addendum: -Heparin to be stopped at 10AM and transition to lovenox 0.5mg /kg Q12H for bridging to therapeutic INR. First dose of lovenox scheduled at 11AM

## 2017-03-29 NOTE — Progress Notes (Signed)
HeartMate 2 Rounding Note  Subjective:    Admitted with LVAD pump stop. Off coumadin and plavix. Started on heparin.   S/p external driveline repair 04/10/29.  Developed hypotension with increased PI events evening of 03/28/17. Given 1L NS.   Feeling great this am. PI back to normal. (Most comfortable when between 6.0 - 7.0).  Denies lightheadedness or dizziness. Staying hydrated. Drinking > 3 bottles of water daily.   INR 1.49. LDH 239  LVAD INTERROGATION:  HeartMate II LVAD:  Flow 3.5 liters/min, speed 8800, power 4, PI 6.9.   Objective:    Vital Signs:   Temp:  [97.5 F (36.4 C)-97.9 F (36.6 C)] 97.6 F (36.4 C) (05/25 0300) Pulse Rate:  [41-116] 58 (05/25 0700) Resp:  [10-24] 18 (05/25 0700) BP: (60-128)/(29-89) 115/86 (05/25 0700) SpO2:  [81 %-99 %] 96 % (05/25 0700) Weight:  [202 lb 9.6 oz (91.9 kg)] 202 lb 9.6 oz (91.9 kg) (05/25 0600) Last BM Date: 03/27/17 Mean arterial Pressure 74  Intake/Output:   Intake/Output Summary (Last 24 hours) at 03/29/17 0719 Last data filed at 03/29/17 0600  Gross per 24 hour  Intake          2091.33 ml  Output             2150 ml  Net           -58.67 ml     Physical Exam: GENERAL: Well appearing this am. NAD.  HEENT: Normal. NECK: Supple, JVP 7-8 cm. Carotids OK.  CARDIAC:  Mechanical heart sounds with LVAD hum present.  LUNGS:  CTAB, normal effort.  ABDOMEN:  NT, ND, no HSM. No bruits or masses. +BS  LVAD exit site: Well-healed and incorporated. Dressing dry and intact. No erythema or drainage. Stabilization device present and accurately applied. Driveline dressing changed daily per sterile technique. EXTREMITIES:  Warm and dry. No cyanosis, clubbing, rash, or edema. RUE PICC C/D/I NEUROLOGIC:  Alert & oriented x 3. Cranial nerves grossly intact. Moves all 4 extremities w/o difficulty. Affect pleasant   Telemetry: Personally reviewed, NSR 60s   Labs: Basic Metabolic Panel:  Recent Labs Lab 03/26/17 1217  03/27/17 0449 03/28/17 0549 03/29/17 0254  NA 134* 138 138 139  K 4.9 4.2 5.0 4.9  CL 105 105 108 107  CO2 22 25 22 25   GLUCOSE 104* 104* 109* 111*  BUN 33* 29* 28* 23*  CREATININE 1.46* 1.42* 1.43* 1.41*  CALCIUM 9.1 8.9 8.8* 8.6*    Liver Function Tests:  Recent Labs Lab 03/26/17 1217  AST 29  ALT 24  ALKPHOS 95  BILITOT 1.0  PROT 6.9  ALBUMIN 4.0   No results for input(s): LIPASE, AMYLASE in the last 168 hours. No results for input(s): AMMONIA in the last 168 hours.  CBC:  Recent Labs Lab 03/26/17 1217 03/27/17 0449 03/28/17 0549 03/29/17 0254  WBC 6.7 5.9 5.3 4.6  HGB 11.2* 10.9* 11.0* 10.2*  HCT 35.1* 33.7* 34.9* 32.2*  MCV 95.1 94.9 96.4 97.6  PLT 151 141* 139* 148*    INR:  Recent Labs Lab 03/26/17 03/26/17 1217 03/27/17 0449 03/28/17 0549 03/29/17 0254  INR 2.0 1.91 1.94 1.67 1.49    Other results:  EKG:   Imaging: No results found.   Medications:     Scheduled Medications: . amiodarone  200 mg Oral Daily  . atorvastatin  80 mg Oral q1800  . Chlorhexidine Gluconate Cloth  6 each Topical Daily  . levothyroxine  200 mcg Oral Once per  day on Sun Tue Wed Thu Fri Sat  . [START ON 04/01/2017] levothyroxine  300 mcg Oral Q Mon  . metoprolol succinate  25 mg Oral BID  . pantoprazole  40 mg Oral BID  . ranolazine  500 mg Oral BID  . sodium chloride flush  10-40 mL Intracatheter Q12H  . sodium chloride flush  3 mL Intravenous Q12H  . warfarin  5 mg Oral ONCE-1800  . Warfarin - Pharmacist Dosing Inpatient   Does not apply q1800    Infusions: . sodium chloride 250 mL (03/28/17 2107)  . heparin 1,000 Units/hr (03/29/17 0600)    PRN Medications: sodium chloride, acetaminophen, docusate sodium, ondansetron (ZOFRAN) IV, sodium chloride flush, sodium chloride flush   Assessment:   1. LVAD Complication 2. Chronic Systolic Heart Failure 3. PAF 4. H/O VT 5. H/O CVA 6. H/O GI bleed 7. H/O Amio induced hyperthyroidism.  8. Chronic   Anticoagulation    Plan/Discussion:    S/p external driveline repair.   No further pump stops since repair. No PI events since IVF.    Back on coumadin. INR 1.49 this am.  Continue heparin until > 1.8. LDH 239. Remain in house until coumadin > 1.8. Possibly home over weekend.   MAPs and PI improved with IV fluid. No need to cut back BB for now.   I reviewed the LVAD parameters from today, and compared the results to the patient's prior recorded data.  No programming changes were made.  The LVAD is functioning within specified parameters.  The patient performs LVAD self-test daily.  LVAD interrogation was negative for any significant power changes, alarms or PI events/speed drops.  LVAD equipment check completed and is in good working order.  Back-up equipment present.   LVAD education done on emergency procedures and precautions and reviewed exit site care.  Length of Stay: 3  [name]  03/29/2017, 7:19 AM  VAD Team --- VAD ISSUES ONLY--- Pager [phone] (7am - 7am)  Advanced Heart Failure Team  Pager [phone] (M-F; 7a - 4p)  Please contact [facility] for night-coverage after hours (4p -7a ) and weekends on [email or web address]  Patient seen and examined with the above-signed Advanced Practice Provider and/or Housestaff. I personally reviewed laboratory data, imaging studies and relevant notes. I independently examined the patient and formulated the important aspects of the plan. I have edited the note to reflect any of my changes or salient points. I have personally discussed the plan with the patient and/or family.  He is s/p external driveline repair yesterday. No pump stops overnight. INR now 1.49. He is on heparin. Restart coumadin and Plavix. MAPs and PIs improved with iVF. Home when INR > 1.8.   VAD parameters stable.   Glori Bickers, MD  8:35 AM

## 2017-03-29 NOTE — Discharge Summary (Signed)
Advanced Heart Failure Team  Discharge Summary   Patient ID: [name] MRN: [id number], DOB/AGE: [birth year] 76 y.o. Admit date: 03/26/2017 D/C date:     03/29/2017   Primary Discharge Diagnoses 1. 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 Chronic systolic HF 3. PAF 4. H/o VT 5. H/o CVA 6. H/o GI bleed 7. H/o amio induced hyperthyroidism 8. Chronic anticoagulation  Hospital Course:   [name] is a 76 y.o. 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   Pt presented to [facility] 03/26/17 with low flow alarms and nausea that occurred lying on either side.  Overall felt OK otherwise. Labwork negative but on LVAD interrogation noted to have 3 pump stops x 24 hrs.  Chest and ABD x rays checked and log file sent to [facility].  Placed on battery x 1 and left on grounded table to monitor for further stops. PICC placed and Epi kept at bedside.  Coumadin and plavix held in event pt needed pump exchange.   Pt had 2 additional pump stops overnight into 03/27/17. Xray with external defect in driveline. [facility] contacted and engineers dispatched.  Pt underwent external driveline repair.   Pt underwent driveline repair at bedside 7/34/28 without complication.  Hospital course complicated by PI events and hypotension.  Meds adjusted and given IVF with rapid improvement. Not thought to be related to driveline repair, more likely dietary changes from home > hospital.   Pt examined am of 03/29/17 and thought stable for home. INR subtherapeutic with holding for possible surgery.  After discussion with family and pharmacy, pt and providers OK with going home with lovenox bridge. He will check INR on Sunday with his home device and text to VAD coordinator.   Pt will be discharged home in stable condition with follow up as below.    LVAD Interrogation HM II:   Speed: 8800 Flow: 3.5 PI: 6.9 Power: 4 Back-up speed:  8200  Please note, Narrative was copied from original Discharge summary that was entered as progress note in error. Information is all new from today.   Discharge Weight: 202 lbs Discharge Vitals: Blood pressure 105/81, pulse (!) 58, temperature 97.9 F (36.6 C), temperature source Oral, resp. rate 13, height 6' (1.829 m), weight 202 lb 9.6 oz (91.9 kg), SpO2 98 %.  Labs: Lab Results  Component Value Date   WBC 4.6 03/29/2017   HGB 10.2 (L) 03/29/2017   HCT 32.2 (L) 03/29/2017   MCV 97.6 03/29/2017   PLT 148 (L) 03/29/2017    Recent Labs Lab 03/26/17 1217  03/29/17 0254  NA 134*  < > 139  K 4.9  < > 4.9  CL 105  < > 107  CO2 22  < > 25  BUN 33*  < > 23*  CREATININE 1.46*  < > 1.41*  CALCIUM 9.1  < > 8.6*  PROT 6.9  --   --   BILITOT 1.0  --   --   ALKPHOS 95  --   --   ALT 24  --   --   AST 29  --   --   GLUCOSE 104*  < > 111*  < > = values in this interval not displayed. Lab Results  Component Value Date   CHOL 106 11/05/2015   HDL 33 (L) 11/05/2015   LDLCALC 60 11/05/2015   TRIG 67 11/05/2015   BNP (last 3  results)  Recent Labs  07/18/16 2253  BNP 1,289.7*    ProBNP (last 3 results) No results for input(s): PROBNP in the last 8760 hours.   Diagnostic Studies/Procedures   No results found.  Discharge Medications   Allergies as of 03/29/2017      Reactions   Demerol [meperidine] Other (See Comments)   Paralysis. Could only move eyes. Paralysis. Could only move eyes.      Medication List    TAKE these medications   amiodarone 200 MG tablet Commonly known as:  PACERONE Take 1 tablet (200 mg total) by mouth daily.   atorvastatin 80 MG tablet Commonly known as:  LIPITOR Take 1 tablet (80 mg total) by mouth daily at 6 PM.   clopidogrel 75 MG tablet Commonly known as:  PLAVIX Take 1 tablet (75 mg total) by mouth daily.   docusate sodium 100 MG capsule Commonly known as:  COLACE Take 200 mg by mouth daily as needed for mild constipation.    enoxaparin 60 MG/0.6ML injection Commonly known as:  LOVENOX Inject 0.5 mLs (50 mg total) into the skin every 12 (twelve) hours.   furosemide 20 MG tablet Commonly known as:  LASIX Take 1 tablet (20 mg total) by mouth as needed for edema (weight gain).   ICAPS PO Take 1 tablet by mouth 2 (two) times daily.   levothyroxine 200 MCG tablet Commonly known as:  SYNTHROID, LEVOTHROID TAKE 1 TABLET DAILY BEFORE BREAKFAST WITH 1 & 1/2 TABLETS EVERY MONDAY. What changed:  how much to take  how to take this  when to take this  additional instructions   metoprolol succinate 25 MG 24 hr tablet Commonly known as:  TOPROL-XL Take 1 tablet (25 mg total) by mouth 2 (two) times daily.   nitroGLYCERIN 0.4 MG SL tablet Commonly known as:  NITROSTAT Place 0.4 mg under the tongue every 5 (five) minutes as needed for chest pain.   pantoprazole 40 MG tablet Commonly known as:  PROTONIX TAKE 1 TABLET TWICE DAILY What changed:  See the new instructions.   promethazine 12.5 MG tablet Commonly known as:  PHENERGAN Take 12.5 mg by mouth every 6 (six) hours as needed for nausea or vomiting.   ranolazine 500 MG 12 hr tablet Commonly known as:  RANEXA TAKE 1 TABLET (500 MG TOTAL) BY MOUTH 2 (TWO) TIMES DAILY.   warfarin 2 MG tablet Commonly known as:  COUMADIN Take 2 mg on Mon / Wed / Fri . Take 4 mg on Tues / Thurs/ Sat / Sun What changed:  how much to take  additional instructions   zinc gluconate 50 MG tablet Take 50 mg by mouth 2 (two) times daily.       Disposition   The patient will be discharged in stable condition to home. Discharge Instructions    (HEART FAILURE PATIENTS) Call MD:  Anytime you have any of the following symptoms: 1) 3 pound weight gain in 24 hours or 5 pounds in 1 week 2) shortness of breath, with or without a dry hacking cough 3) swelling in the hands, feet or stomach 4) if you have to sleep on extra pillows at night in order to breathe.    Complete by:   As directed    Diet - low sodium heart healthy    Complete by:  As directed    Increase activity slowly    Complete by:  As directed      Follow-up Information    [facility] Follow up on 04/04/2017.   Specialty:  Cardiology Why:  at 1100 am for post hospital VAD check.  Code for parking is 6001. Contact information: [address] [id number] [place] [phone]            Duration of Discharge Encounter: Greater than 35 minutes   Signed, Volney American 03/29/2017, 1:49 PM  Patient seen and examined with the above-signed Advanced Practice Provider and/or Housestaff. I personally reviewed laboratory data, imaging studies and relevant notes. I independently examined the patient and formulated the important aspects of the plan. I have edited the note to reflect any of my changes or salient points. I have personally discussed the plan with the patient and/or family.  Agree with above. S/p successful driveline repair. [place] for d/c.  Glori Bickers, MD  9:24 PM

## 2017-03-30 LAB — POCT INR: INR: 1.5

## 2017-03-31 ENCOUNTER — Ambulatory Visit (HOSPITAL_COMMUNITY): Payer: Self-pay | Admitting: *Deleted

## 2017-03-31 LAB — POCT INR: INR: 1.7

## 2017-04-02 ENCOUNTER — Ambulatory Visit (HOSPITAL_COMMUNITY): Payer: Self-pay | Admitting: Pharmacist

## 2017-04-02 LAB — POCT INR: INR: 1.9

## 2017-04-03 ENCOUNTER — Ambulatory Visit (INDEPENDENT_AMBULATORY_CARE_PROVIDER_SITE_OTHER): Payer: Medicare Other | Admitting: *Deleted

## 2017-04-03 DIAGNOSIS — I472 Ventricular tachycardia, unspecified: Secondary | ICD-10-CM

## 2017-04-04 ENCOUNTER — Other Ambulatory Visit (HOSPITAL_COMMUNITY): Payer: Self-pay | Admitting: Unknown Physician Specialty

## 2017-04-04 ENCOUNTER — Ambulatory Visit (HOSPITAL_COMMUNITY): Payer: Self-pay | Admitting: Unknown Physician Specialty

## 2017-04-04 ENCOUNTER — Ambulatory Visit (HOSPITAL_COMMUNITY)
Admit: 2017-04-04 | Discharge: 2017-04-04 | Disposition: A | Discharge status: Home / Self Care | Payer: Medicare Other | Attending: Cardiology | Admitting: Cardiology

## 2017-04-04 VITALS — BP 94/68 | HR 77 | Ht 72.0 in | Wt 207.0 lb

## 2017-04-04 DIAGNOSIS — Z7901 Long term (current) use of anticoagulants: Secondary | ICD-10-CM | POA: Diagnosis not present

## 2017-04-04 DIAGNOSIS — Z95810 Presence of automatic (implantable) cardiac defibrillator: Secondary | ICD-10-CM | POA: Diagnosis not present

## 2017-04-04 DIAGNOSIS — I5022 Chronic systolic (congestive) heart failure: Secondary | ICD-10-CM | POA: Diagnosis not present

## 2017-04-04 DIAGNOSIS — I130 Hypertensive heart and chronic kidney disease with heart failure and stage 1 through stage 4 chronic kidney disease, or unspecified chronic kidney disease: Secondary | ICD-10-CM | POA: Insufficient documentation

## 2017-04-04 DIAGNOSIS — I480 Paroxysmal atrial fibrillation: Secondary | ICD-10-CM | POA: Diagnosis not present

## 2017-04-04 DIAGNOSIS — E785 Hyperlipidemia, unspecified: Secondary | ICD-10-CM | POA: Insufficient documentation

## 2017-04-04 DIAGNOSIS — Z95811 Presence of heart assist device: Secondary | ICD-10-CM

## 2017-04-04 DIAGNOSIS — Z8673 Personal history of transient ischemic attack (TIA), and cerebral infarction without residual deficits: Secondary | ICD-10-CM | POA: Insufficient documentation

## 2017-04-04 DIAGNOSIS — I2510 Atherosclerotic heart disease of native coronary artery without angina pectoris: Secondary | ICD-10-CM | POA: Diagnosis not present

## 2017-04-04 DIAGNOSIS — Z79899 Other long term (current) drug therapy: Secondary | ICD-10-CM | POA: Diagnosis not present

## 2017-04-04 LAB — BASIC METABOLIC PANEL
ANION GAP: 7 (ref 5–15)
BUN: 33 mg/dL — ABNORMAL HIGH (ref 6–20)
CALCIUM: 9 mg/dL (ref 8.9–10.3)
CO2: 23 mmol/L (ref 22–32)
Chloride: 108 mmol/L (ref 101–111)
Creatinine, Ser: 1.82 mg/dL — ABNORMAL HIGH (ref 0.61–1.24)
GFR, EST AFRICAN AMERICAN: 40 mL/min — AB (ref >60)
GFR, EST NON AFRICAN AMERICAN: 34 mL/min — AB (ref >60)
GLUCOSE: 108 mg/dL — AB (ref 65–99)
Potassium: 4.9 mmol/L (ref 3.5–5.1)
Sodium: 138 mmol/L (ref 135–145)

## 2017-04-04 LAB — PROTIME-INR
INR: 1.92
PROTHROMBIN TIME: 22.2 s — AB (ref 11.4–15.2)

## 2017-04-04 LAB — CBC
HEMATOCRIT: 36.2 % — AB (ref 39.0–52.0)
Hemoglobin: 11.4 g/dL — ABNORMAL LOW (ref 13.0–17.0)
MCH: 30.4 pg (ref 26.0–34.0)
MCHC: 31.5 g/dL (ref 30.0–36.0)
MCV: 96.5 fL (ref 78.0–100.0)
Platelets: 158 10*3/uL (ref 150–400)
RBC: 3.75 MIL/uL — ABNORMAL LOW (ref 4.22–5.81)
RDW: 16.1 % — ABNORMAL HIGH (ref 11.5–15.5)
WBC: 6 10*3/uL (ref 4.0–10.5)

## 2017-04-04 LAB — LACTATE DEHYDROGENASE: LDH: 263 U/L — AB (ref 98–192)

## 2017-04-04 MED ORDER — FUROSEMIDE 20 MG PO TABS: 20.0000 mg | ORAL_TABLET | ORAL | 3 refills | Status: DC | PRN

## 2017-04-04 NOTE — Progress Notes (Signed)
Patient presents for hospital d/c  follow up in [facility] today. Reports no problems with VAD equipment or concerns with drive line.  Vital Signs:  Doppler Pressure 92  Automatc BP: 94/68 (83) HR:77   SPO2:97  %  Weight: 207 lb w/o eqt Last weight: 201 lb Home weights: 200 lbs   VAD Indication: DT - age excluding  VAD interrogation & Equipment Management: Speed:8800 Flow: 3.3 Power:4.1 w    PI: 5.4  Alarms: no clinical alarms Events: 30+ PI events daily  Fixed speed 8800 Low speed limit: 8200  Primary Controller:  Replace back up battery in 35months. Back up controller:   Replace back up battery in 16 months.  Annual Equipment Maintenance on UBC/PM was performed on 10/2016.   I reviewed the LVAD parameters from today and compared the results to the patient's prior recorded data. LVAD interrogation was NEGATIVE for significant power changes, NEGATIVE for clinical alarms and STABLE for PI events/speed drops. No programming changes were made and pump is functioning within specified parameters. Pt is performing daily controller and system monitor self tests along with completing weekly and monthly maintenance for LVAD equipment.  LVAD equipment check completed and is in good working order. Back-up equipment present. Charged back up battery and performed self-test on equipment.   Exit Site Care: Drive line is being maintained weekly  by [name]. Drive line exit site well healed and incorporated. The velour is fully implanted at exit site. Dressing dry and intact. No erythema or drainage. Stabilization device present and accurately applied. Pt denies fever or chills. Pt states they have adequate dressing supplies at home.   Significant Events on VAD Support:  02/2015 >> GIB (removed ASA, 2-2.5 INR) 10/2015 >> CVA (Plavix started) 02/2017> GIB (removed ASA, scopes negative)  Device:Medtronic Therapies: on Last check: unknown   BP & Labs:  MAP 92 - Doppler is  reflecting modofied systolic  Hgb 16.3 - No S/S of bleeding. Specifically denies melena/BRBPR or nosebleeds.  LDH stable at 263 with established baseline of 280. Denies tea-colored urine. No power elevations noted on interrogation.   Plan:  1. Return to clinic in 2 months  Tanda Rockers RN [place] Coordinator   Office: [phone] 24/7 Emergency [place] Pager: [phone]

## 2017-04-04 NOTE — Progress Notes (Signed)
Patient ID: [name], male   DOB: 02/15/1941, 76 y.o.   MRN: [id number]   VAD CLINIC PROGRESS NOTE  PCP: Dr. Kandice Robinsons ([place]) GU: Dr Risa Grill  Neuro Surgeon: Dr Vertell Limber Vascular: Dr Kellie Simmering Primary HF: Dr Haroldine Laws  [name] is a 76 yo male with h/o VT, PAD and severe systolic HF (EF 14-97%) due to mixed ischemic/nonischemic CM he is s/p HM II LVAD implant for destination therapy on October 19, 2013.VAD implantation was complicated by VT, syncope, AF/Aflutter and RHF. Required short term milrinone.    GU Event 09/01/14 had impacted ureteral stone and underwent cystoscopy and flexible ureteroscopy with lithotripsy and basket extraction.   09/2015 Hematuria and chronic nephrolithiasis. Renal US showed mild hyronephrosis and bilateral renal cysts. Dr. Risa Grill took back for repeat lithotripsy and stone extraction by ureteroscopy and J stent placement.  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  4/2-18 - Episode of melena. Hgb 10.9-> 8.7. 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  01/2016 R CEA. He was started on Plavix.  01/2016 Lumbar fusion L3-L5 with pedicle screws posterolateral arthrodesis with BMP, allograft  Admitted 9/17 with high fevers and productive cough. CXR negative. Treated with levaquin x 7 days for acute bronchitis. Also found to have VT and underwent DC-CV. Seen by EP and amio increased  Had ramp echo in 10/17 speed turned down to 8800 due to RV failure.  Admitted 4/18 with melena and drop in Hgb. Transfused 2u. EGD/colon negative.  Admitted 5/18 with pump stops. Found to have external driveline kink and underwent successful repair  Follow up LVAD Clinic: Presents for post hospital f/u. Doing great. No further pump stops. Back to usual routine. Does all ADLs without difficulty. Not taking lasix. Drinking a lot of water. Denies CP, SOB, PND, orthopnea or edema. . No bleeding. No  melena/BRBPR. No dizziness or orthostasis.Taking all medications as prescribed. No palpitations. No neuro symptoms or dark urine.    VAD Indication: DT - age excluding  VAD interrogation & Equipment Management: Speed:8800 Flow: 3.3 Power:4.1 w PI: 5.4  Alarms: no clinical alarms Events: 30+ PI events daily  Fixed speed 8800 Low speed limit: 8200  Primary Controller: Replace back up battery in 63months. Back up controller: Replace back up battery in 41months.    Past Medical History:  Diagnosis Date  . Arthritis   . Automatic implantable cardioverter-defibrillator in situ    a. s/p Medtronic Evera device serial [id number]    . Bradycardia   . CAD (coronary artery disease)    a. s/p MI 1982;  s/p DES to CFX 2011;  s/p NSTEMI 4/12 in setting of VTach;  cath 7/12:  LM ok, LAD mid 30-40%, Dx's with 40%, mCFX stent patent, prox to mid RCA occluded with R to R and L to R collats.  Medical Tx was continued  . Chronic systolic heart failure ([place])    a. s/p LVAD 10/2013 for destination therapy  . CKD (chronic kidney disease)   . CVD (cerebrovascular disease)    a. s/p L CEA 2008  . Cytopenia   . Dysrhythmia    AFIB  . History of GI bleed    a. on ASA- started on plavix during 10/2015 admission for CVA  . HLD (hyperlipidemia)   . HTN (hypertension)   . Hypothyroidism   . Inappropriate therapy from implantable cardioverter-defibrillator    a. ATP for sinus tach  SVT wavelet identified SVT but was no  passive (2014)  . Ischemic cardiomyopathy   . 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 L Ear   . Myocardial infarction ([place])    1992  . PAF (paroxysmal atrial fibrillation) ([place])   . PVD (peripheral vascular disease) ([place])    a. 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 Shortness of breath dyspnea    W/ EXERTION   . Sleep apnea    NO CPAP NOW  HE SENT IT BACK YRS AGO  . Stroke [facility])     a. 10/2015 admission   . Ventricular tachycardia ([place])    a. s/p AICD;   h/o ICD shock;  Amiodarone Rx. S/P ablation in 2012    Current Outpatient Prescriptions  Medication Sig Dispense Refill  . amiodarone (PACERONE) 200 MG tablet Take 1 tablet (200 mg total) by mouth daily. 90 tablet 3  . atorvastatin (LIPITOR) 80 MG tablet Take 1 tablet (80 mg total) by mouth daily at 6 PM. 30 tablet 6  . clopidogrel (PLAVIX) 75 MG tablet Take 1 tablet (75 mg total) by mouth daily. 90 tablet 3  . docusate sodium (COLACE) 100 MG capsule Take 200 mg by mouth daily as needed for mild constipation.     [name] levothyroxine (SYNTHROID, LEVOTHROID) 200 MCG tablet TAKE 1 TABLET DAILY BEFORE BREAKFAST WITH 1 & 1/2 TABLETS EVERY MONDAY. (Patient taking differently: Take 200-300 mcg by mouth See admin instructions. Take 200 mcg by mouth daily except take 300 mcg by mouth every Monday.) 45 tablet 6  . metoprolol succinate (TOPROL-XL) 25 MG 24 hr tablet Take 1 tablet (25 mg total) by mouth 2 (two) times daily. 180 tablet 3  . Multiple Vitamins-Minerals (ICAPS PO) Take 1 tablet by mouth 2 (two) times daily.    . ranolazine (RANEXA) 500 MG 12 hr tablet TAKE 1 TABLET (500 MG TOTAL) BY MOUTH 2 (TWO) TIMES DAILY. 60 tablet 6  . warfarin (COUMADIN) 2 MG tablet Take 2-4 mg by mouth daily. Take 4 mg (2 tabs) daily except 2 mg (1 tab) on Mon/Wed.    [name] zinc gluconate 50 MG tablet Take 50 mg by mouth 2 (two) times daily.     [name] enoxaparin (LOVENOX) 60 MG/0.6ML injection Inject 0.5 mLs (50 mg total) into the skin every 12 (twelve) hours. (Patient not taking: Reported on 04/04/2017) 10 Syringe 0  . furosemide (LASIX) 20 MG tablet Take 1 tablet (20 mg total) by mouth as needed for edema (weight gain). 12 tablet 3  . nitroGLYCERIN (NITROSTAT) 0.4 MG SL tablet Place 0.4 mg under the tongue every 5 (five) minutes as needed for chest pain.    . pantoprazole (PROTONIX) 40 MG tablet TAKE 1 TABLET TWICE DAILY (Patient taking differently: Take 40 mg  by mouth twice daily) 60 tablet 3  . promethazine (PHENERGAN) 12.5 MG tablet Take 12.5 mg by mouth every 6 (six) hours as needed for nausea or vomiting.     No current facility-administered medications for this encounter.     Demerol [meperidine]  REVIEW OF SYSTEMS: All systems negative except as listed in HPI, PMH and Problem list.  Vital Signs:  Doppler Pressure 92    Automatc BP: 94/68 (83) HR:77  SPO2:97  %  Weight: 207 lb w/o eqt Last weight: 201 lb Home weights: 200 lbs  Physical Exam: GENERAL: NAD. Here with [name] HEENT: normal anicteric NECK: Supple, JVP 5-6  no carotid bruits. R CEA scar well-healed No lymphadenopathy or thyromegaly appreciated.   CARDIAC:  Mechanical heart sounds. LVAD hum present Normal HM2 sounds LUNGS:  Clear. No wheezing ABDOMEN:  Soft. NT/ND good BS  LVAD exit site: well-healed and incorporated.  Dressing C/D/I  No erythema or drainage.  Stabilization device present and accurately applied.  Driveline dressing is being changed weekly per sterile technique. Site looks good. No signs infection Extremities: no cyanosis, clubbing, rash, edema Neuro: alert & oriented x 3, cranial nerves grossly intact. moves all 4 extremities w/o difficulty. Affect pleasant .   ASSESSMENT AND PLAN:  1. Chronic systolic HF: s/p HM-II LVAD 10/2014.  Remains NYHA II.   - Doing well.  NYHA II. Volume status looks good - LVAD speed reduced to 8800 in 10/17 due to RV failure. VAD parameters stable. Flows on low end but no formal alarms - Underwent recent driveline repair. No further pump stops -. Volume status ok. Continue prn lasix 2.  Atrial arrhythmias: Atrial fibrillation, atrial tachycardia.  S/p DC-CV 06/04/14, DC-CV 03/02/15 and 01/19/16.   -Has been in NSR. Continue amio to 200 mg daily.  Watch for recurrent thyroid issues. -With recent VT we have been hesitant to drop dose again 3. HTN:  --MAP  Well controlled. Continue current regimen 4. LVAD:  --LVAD  parameters reviewed personally. As above, flow on low end but no formal alarms. Continue fluid intake.  5. CKD, stage III:   --Stable. Creatinine up today 1.55 ->1.82. Encouraged to drink more fluid. 6. Anticoagulation management:  --Goal INR 2-2.5  He is off ASA with GI bleeding. Continue Plavix.  --Uses home INR monitor. INR 1.92  today. I personally discussed with PharmD today. --LDH 263 7. H/o GIB:  --s/p clipping of 2 jejunal AVMs in 4/16. Continue coumadin and plavix.(with h/o CVA). --Admitted 4/18 with recurrent GIB. Transfused 2u. EGD/colon negative --HGb up to 11.4 today. No current bleeding. Continue PPI      8. Lumbar compression fracture:  s/p L3-L5 fusion 02/03/16. --back pain resolved 9. 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  Continue atorvastatin. (also warfarin INR goal 2-2.5). S/p R CEA 01/13/16. Continue  plavix.  11. H/o amio-induced hypethyroidism:  --Followed by Dr. Forde Dandy in past.  --Amio restarted peri-operatively at low dose due to recurrent atrial arrhythmias and VT.  --Recent TSH ok.  --Continue to follow 12. VT  - underwent DC-CV 9/17. Back on amio. Remains quiescent.   Frank Curd,MD 12:39 PM

## 2017-04-05 LAB — CUP PACEART REMOTE DEVICE CHECK
Battery Voltage: 2.98 V
Brady Statistic AP VP Percent: 0.31 %
Brady Statistic AS VP Percent: 0.07 %
Brady Statistic AS VS Percent: 30 %
Date Time Interrogation Session: 20180530102059
HighPow Impedance: 38 Ohm
HighPow Impedance: 47 Ohm
Implantable Lead Implant Date: 20030714
Implantable Lead Location: 753860
Implantable Lead Model: 6944
Implantable Pulse Generator Implant Date: 20140721
Lead Channel Impedance Value: 589 Ohm
Lead Channel Pacing Threshold Amplitude: 1 V
Lead Channel Sensing Intrinsic Amplitude: 1.5 mV
Lead Channel Sensing Intrinsic Amplitude: 1.5 mV
Lead Channel Sensing Intrinsic Amplitude: 2.25 mV
Lead Channel Sensing Intrinsic Amplitude: 2.25 mV
Lead Channel Setting Pacing Amplitude: 2 V
Lead Channel Setting Pacing Pulse Width: 0.4 ms
MDC IDC LEAD IMPLANT DT: 20140801
MDC IDC LEAD LOCATION: 753859
MDC IDC MSMT BATTERY REMAINING LONGEVITY: 58 mo
MDC IDC MSMT LEADCHNL RA IMPEDANCE VALUE: 399 Ohm
MDC IDC MSMT LEADCHNL RA PACING THRESHOLD PULSEWIDTH: 0.4 ms
MDC IDC MSMT LEADCHNL RV IMPEDANCE VALUE: 475 Ohm
MDC IDC MSMT LEADCHNL RV PACING THRESHOLD AMPLITUDE: 0.875 V
MDC IDC MSMT LEADCHNL RV PACING THRESHOLD PULSEWIDTH: 0.4 ms
MDC IDC SET LEADCHNL RV PACING AMPLITUDE: 2 V
MDC IDC SET LEADCHNL RV SENSING SENSITIVITY: 0.3 mV
MDC IDC STAT BRADY AP VS PERCENT: 69.62 %
MDC IDC STAT BRADY RA PERCENT PACED: 67.61 %
MDC IDC STAT BRADY RV PERCENT PACED: 0.47 %

## 2017-04-05 NOTE — Progress Notes (Signed)
Remote ICD transmission.   

## 2017-04-09 ENCOUNTER — Ambulatory Visit (HOSPITAL_COMMUNITY): Payer: Self-pay | Admitting: Pharmacist

## 2017-04-09 ENCOUNTER — Other Ambulatory Visit (HOSPITAL_COMMUNITY): Payer: Self-pay | Admitting: *Deleted

## 2017-04-09 DIAGNOSIS — I5041 Acute combined systolic (congestive) and diastolic (congestive) heart failure: Secondary | ICD-10-CM

## 2017-04-09 DIAGNOSIS — Z95811 Presence of heart assist device: Secondary | ICD-10-CM

## 2017-04-09 DIAGNOSIS — K922 Gastrointestinal hemorrhage, unspecified: Secondary | ICD-10-CM

## 2017-04-09 LAB — POCT INR: INR: 2.4

## 2017-04-09 MED ORDER — PANTOPRAZOLE SODIUM 40 MG PO TBEC: 40.0000 mg | DELAYED_RELEASE_TABLET | Freq: Two times a day (BID) | ORAL | 6 refills | Status: DC

## 2017-04-12 ENCOUNTER — Encounter: Payer: Self-pay | Admitting: Cardiology

## 2017-04-16 ENCOUNTER — Ambulatory Visit (HOSPITAL_COMMUNITY): Payer: Self-pay | Admitting: Pharmacist

## 2017-04-16 LAB — POCT INR: INR: 2.3

## 2017-04-23 ENCOUNTER — Ambulatory Visit (HOSPITAL_COMMUNITY): Payer: Self-pay | Admitting: Pharmacist

## 2017-04-23 LAB — POCT INR: INR: 2.5

## 2017-04-30 ENCOUNTER — Ambulatory Visit (HOSPITAL_COMMUNITY): Payer: Self-pay | Admitting: Pharmacist

## 2017-04-30 LAB — POCT INR: INR: 1.9

## 2017-05-07 ENCOUNTER — Ambulatory Visit (HOSPITAL_COMMUNITY): Payer: Self-pay | Admitting: Pharmacist

## 2017-05-07 LAB — POCT INR: INR: 3.3

## 2017-05-14 ENCOUNTER — Ambulatory Visit (HOSPITAL_COMMUNITY): Payer: Self-pay | Admitting: Pharmacist

## 2017-05-14 LAB — POCT INR: INR: 2.2

## 2017-05-20 ENCOUNTER — Ambulatory Visit (HOSPITAL_COMMUNITY): Payer: Self-pay | Admitting: Pharmacist

## 2017-05-20 ENCOUNTER — Other Ambulatory Visit (HOSPITAL_COMMUNITY): Payer: Self-pay | Admitting: Unknown Physician Specialty

## 2017-05-20 ENCOUNTER — Ambulatory Visit (HOSPITAL_COMMUNITY)
Admission: RE | Admit: 2017-05-20 | Discharge: 2017-05-20 | Disposition: A | Discharge status: Home / Self Care | Payer: Medicare Other | Source: Ambulatory Visit | Attending: Cardiology | Admitting: Cardiology

## 2017-05-20 VITALS — BP 82/0 | HR 69 | Ht 72.0 in | Wt 208.0 lb

## 2017-05-20 DIAGNOSIS — Z95811 Presence of heart assist device: Secondary | ICD-10-CM

## 2017-05-20 DIAGNOSIS — I472 Ventricular tachycardia, unspecified: Secondary | ICD-10-CM

## 2017-05-20 DIAGNOSIS — Z8673 Personal history of transient ischemic attack (TIA), and cerebral infarction without residual deficits: Secondary | ICD-10-CM | POA: Insufficient documentation

## 2017-05-20 DIAGNOSIS — X58XXXA Exposure to other specified factors, initial encounter: Secondary | ICD-10-CM | POA: Insufficient documentation

## 2017-05-20 DIAGNOSIS — M4856XA Collapsed vertebra, not elsewhere classified, lumbar region, initial encounter for fracture: Secondary | ICD-10-CM | POA: Diagnosis not present

## 2017-05-20 DIAGNOSIS — I130 Hypertensive heart and chronic kidney disease with heart failure and stage 1 through stage 4 chronic kidney disease, or unspecified chronic kidney disease: Secondary | ICD-10-CM | POA: Diagnosis present

## 2017-05-20 DIAGNOSIS — I5022 Chronic systolic (congestive) heart failure: Secondary | ICD-10-CM | POA: Diagnosis not present

## 2017-05-20 DIAGNOSIS — N183 Chronic kidney disease, stage 3 unspecified: Secondary | ICD-10-CM

## 2017-05-20 DIAGNOSIS — E785 Hyperlipidemia, unspecified: Secondary | ICD-10-CM | POA: Diagnosis not present

## 2017-05-20 DIAGNOSIS — Z79891 Long term (current) use of opiate analgesic: Secondary | ICD-10-CM | POA: Diagnosis not present

## 2017-05-20 DIAGNOSIS — Z7901 Long term (current) use of anticoagulants: Secondary | ICD-10-CM | POA: Diagnosis not present

## 2017-05-20 DIAGNOSIS — Z79899 Other long term (current) drug therapy: Secondary | ICD-10-CM | POA: Diagnosis not present

## 2017-05-20 DIAGNOSIS — I10 Essential (primary) hypertension: Secondary | ICD-10-CM

## 2017-05-20 DIAGNOSIS — I480 Paroxysmal atrial fibrillation: Secondary | ICD-10-CM

## 2017-05-20 LAB — COMPREHENSIVE METABOLIC PANEL
ALT: 27 U/L (ref 17–63)
AST: 31 U/L (ref 15–41)
Albumin: 4 g/dL (ref 3.5–5.0)
Alkaline Phosphatase: 111 U/L (ref 38–126)
Anion gap: 7 (ref 5–15)
BUN: 37 mg/dL — ABNORMAL HIGH (ref 6–20)
CHLORIDE: 106 mmol/L (ref 101–111)
CO2: 24 mmol/L (ref 22–32)
CREATININE: 1.74 mg/dL — AB (ref 0.61–1.24)
Calcium: 9 mg/dL (ref 8.9–10.3)
GFR calc non Af Amer: 36 mL/min — ABNORMAL LOW (ref >60)
GFR, EST AFRICAN AMERICAN: 42 mL/min — AB (ref >60)
Glucose, Bld: 93 mg/dL (ref 65–99)
POTASSIUM: 4.5 mmol/L (ref 3.5–5.1)
Sodium: 137 mmol/L (ref 135–145)
Total Bilirubin: 0.6 mg/dL (ref 0.3–1.2)
Total Protein: 7.3 g/dL (ref 6.5–8.1)

## 2017-05-20 LAB — CBC
HCT: 36.8 % — ABNORMAL LOW (ref 39.0–52.0)
Hemoglobin: 11.8 g/dL — ABNORMAL LOW (ref 13.0–17.0)
MCH: 30.5 pg (ref 26.0–34.0)
MCHC: 32.1 g/dL (ref 30.0–36.0)
MCV: 95.1 fL (ref 78.0–100.0)
PLATELETS: 146 10*3/uL — AB (ref 150–400)
RBC: 3.87 MIL/uL — ABNORMAL LOW (ref 4.22–5.81)
RDW: 15.7 % — AB (ref 11.5–15.5)
WBC: 5.6 10*3/uL (ref 4.0–10.5)

## 2017-05-20 LAB — PREALBUMIN: Prealbumin: 23.9 mg/dL (ref 18–38)

## 2017-05-20 LAB — PROTIME-INR
INR: 2.24
PROTHROMBIN TIME: 25.2 s — AB (ref 11.4–15.2)

## 2017-05-20 LAB — LACTATE DEHYDROGENASE: LDH: 318 U/L — ABNORMAL HIGH (ref 98–192)

## 2017-05-20 NOTE — Progress Notes (Signed)
Patient ID: [name], male   DOB: 02-07-1941, 76 y.o.   MRN: [id number]   VAD CLINIC PROGRESS NOTE  PCP: Dr. Kandice Robinsons ([place]) GU: Dr Risa Grill  Neuro Surgeon: Dr Vertell Limber Vascular: Dr Kellie Simmering Primary HF: Dr Haroldine Laws  [name] is a 76 yo male with h/o VT, PAD and severe systolic HF (EF 64-40%) due to mixed ischemic/nonischemic CM he is s/p HM II LVAD implant for destination therapy on October 19, 2013.VAD implantation was complicated by VT, syncope, AF/Aflutter and RHF. Required short term milrinone.   GU Event 09/01/14 had impacted ureteral stone and underwent cystoscopy and flexible ureteroscopy with lithotripsy and basket extraction.   09/2015 Hematuria and chronic nephrolithiasis. Renal US showed mild hyronephrosis and bilateral renal cysts. Dr. Risa Grill took back for repeat lithotripsy and stone extraction by ureteroscopy and J stent placement.  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  4/2-18 - Episode of melena. Hgb 10.9-> 8.7. 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  01/2016 R CEA. He was started on Plavix.  01/2016 Lumbar fusion L3-L5 with pedicle screws posterolateral arthrodesis with BMP, allograft  VAD Pump Event:  03/2017 - Pump stop. Driveline repair.   Admitted 9/17 with high fevers and productive cough. CXR negative. Treated with levaquin x 7 days for acute bronchitis. Also found to have VT and underwent DC-CV. Seen by EP and amio increased  Had ramp echo in 10/17 speed turned down to 8800 due to RV failure  Follow up LVAD Clinic: Today he presents for VAD follow up. Overall feeling ok. He does not require lasix.  Appetite ok. Drinking plenty of fluid. No bleeding problems. Not very active at home. No fever or chills. No VAD issues. His wife continues to change dressing weekly. Taking all medications.   VAD Indication: DT - age excluding  VAD interrogation & Equipment  Management: Speed: 3474 while in the clininc speed dropped to 8300.  Flow: 4.0  Power:4,5  PI: 6.9   Alarms: no clinical alarms Events: 10--15 PI events  Fixed speed 8800 Low speed limit: 8200  Primary Controller: Replace back up battery in 74months. Back up controller: Replace back up battery in 70months.    Past Medical History:  Diagnosis Date  . Arthritis   . Automatic implantable cardioverter-defibrillator in situ    a. s/p Medtronic Evera device serial [id number]    . Bradycardia   . CAD (coronary artery disease)    a. s/p MI 1982;  s/p DES to CFX 2011;  s/p NSTEMI 4/12 in setting of VTach;  cath 7/12:  LM ok, LAD mid 30-40%, Dx's with 40%, mCFX stent patent, prox to mid RCA occluded with R to R and L to R collats.  Medical Tx was continued  . Chronic systolic heart failure ([place])    a. s/p LVAD 10/2013 for destination therapy  . CKD (chronic kidney disease)   . CVD (cerebrovascular disease)    a. s/p L CEA 2008  . Cytopenia   . Dysrhythmia    AFIB  . History of GI bleed    a. on ASA- started on plavix during 10/2015 admission for CVA  . HLD (hyperlipidemia)   . HTN (hypertension)   . Hypothyroidism   . Inappropriate therapy from implantable cardioverter-defibrillator    a. ATP for sinus tach SVT wavelet identified SVT but was no  passive (2014)  . Ischemic cardiomyopathy   .  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 L Ear   . Myocardial infarction ([place])    1992  . PAF (paroxysmal atrial fibrillation) ([place])   . PVD (peripheral vascular disease) ([place])    a. 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 Shortness of breath dyspnea    W/ EXERTION   . Sleep apnea    NO CPAP NOW  HE SENT IT BACK YRS AGO  . Stroke [facility])    a. 10/2015 admission   . Ventricular tachycardia ([place])    a. s/p AICD;   h/o ICD shock;  Amiodarone Rx. S/P ablation in 2012    Current Outpatient  Prescriptions  Medication Sig Dispense Refill  . amiodarone (PACERONE) 200 MG tablet Take 1 tablet (200 mg total) by mouth daily. 90 tablet 3  . atorvastatin (LIPITOR) 80 MG tablet Take 1 tablet (80 mg total) by mouth daily at 6 PM. 30 tablet 6  . clopidogrel (PLAVIX) 75 MG tablet Take 1 tablet (75 mg total) by mouth daily. 90 tablet 3  . docusate sodium (COLACE) 100 MG capsule Take 200 mg by mouth daily as needed for mild constipation.     . furosemide (LASIX) 20 MG tablet Take 1 tablet (20 mg total) by mouth as needed for edema (weight gain). 12 tablet 3  . levothyroxine (SYNTHROID, LEVOTHROID) 200 MCG tablet TAKE 1 TABLET DAILY BEFORE BREAKFAST WITH 1 & 1/2 TABLETS EVERY MONDAY. (Patient taking differently: Take 200-300 mcg by mouth See admin instructions. Take 200 mcg by mouth daily except take 300 mcg by mouth every Monday.) 45 tablet 6  . metoprolol succinate (TOPROL-XL) 25 MG 24 hr tablet Take 1 tablet (25 mg total) by mouth 2 (two) times daily. 180 tablet 3  . Multiple Vitamins-Minerals (ICAPS PO) Take 1 tablet by mouth 2 (two) times daily.    . nitroGLYCERIN (NITROSTAT) 0.4 MG SL tablet Place 0.4 mg under the tongue every 5 (five) minutes as needed for chest pain.    . pantoprazole (PROTONIX) 40 MG tablet Take 1 tablet (40 mg total) by mouth 2 (two) times daily. 180 tablet 6  . promethazine (PHENERGAN) 12.5 MG tablet Take 12.5 mg by mouth every 6 (six) hours as needed for nausea or vomiting.    . ranolazine (RANEXA) 500 MG 12 hr tablet TAKE 1 TABLET (500 MG TOTAL) BY MOUTH 2 (TWO) TIMES DAILY. 60 tablet 6  . warfarin (COUMADIN) 2 MG tablet Take 2-4 mg by mouth daily. Take 4 mg (2 tabs) daily except 2 mg (1 tab) on Mon/Wed.    [name] zinc gluconate 50 MG tablet Take 50 mg by mouth 2 (two) times daily.     [name] enoxaparin (LOVENOX) 60 MG/0.6ML injection Inject 0.5 mLs (50 mg total) into the skin every 12 (twelve) hours. (Patient not taking: Reported on 05/20/2017) 10 Syringe 0   No current  facility-administered medications for this encounter.     Demerol [meperidine]  REVIEW OF SYSTEMS: All systems negative except as listed in HPI, PMH and Problem list.  Vital Signs:  Doppler Pressure 82   Automatc BP: 108/64 (85)  HR:69 SPO2: 97   Weight: 208 lb w/o eqt Last weight: 207lb Home weights: 200-201  lbs    Physical Exam: GENERAL: Elderly. Well appearing, male who presents to clinic today in no acute distress. [name] present  HEENT:  normal  NECK: Supple, JVP 5-6  .  2+ bilaterally, no bruits.  No lymphadenopathy or thyromegaly appreciated.   CARDIAC:  Mechanical heart sounds with LVAD hum present.  LUNGS:  Clear to auscultation bilaterally.  ABDOMEN:  Soft, round, nontender, positive bowel sounds x4.     LVAD exit site: well-healed and incorporated.  Dressing dry and intact.  No erythema or drainage.  Stabilization device present and accurately applied.  Driveline dressing is being changed weekly per sterile technique. EXTREMITIES:  Warm and dry, no cyanosis, clubbing, rash or edema  NEUROLOGIC:  Alert and oriented x 4.  Gait steady.  No aphasia.  No dysarthria.  Affect pleasant.      ASSESSMENT AND PLAN:  1. Chronic systolic HF: s/p HM-II LVAD 10/2014.  Remains NYHA II.   - Doing well.  NYHA II. Volume status stable. Taking lasix as needed.  -VAD speed dropped to 8300 while in the clinic. Log file sent with no complications noted. - - 03/26/2017 Underwent recent driveline repair. No further pump stops. 2.  Atrial arrhythmias: Atrial fibrillation, atrial tachycardia.  S/p DC-CV 06/04/14, DC-CV 03/02/15 and 01/19/16.   Regular pulse. Continue amio to 200 mg daily.  Watch for recurrent thyroid issues. Check TSH next visit. With recent VT we have been hesitant to drop dose again 3. HTN:  --Map controlled.  4. LVAD:  --Log file sent with no pump stops or issues identified. .  5. CKD, stage III:   -Check CMET today.  6. Anticoagulation management:  --Goal INR 2-2.5   He is off ASA with GI bleeding. Continue Plavix.  --Todays INR 2.24. Discussed with Pharm D.  --LDH 263 7. H/o GIB:  --s/p clipping of 2 jejunal AVMs in 4/16. Continue coumadin and plavix.(with h/o CVA). --Admitted 4/18 with recurrent GIB. Transfused 2u. EGD/colon negative --Hgb 11.8 . No bleeding problems.  Continue PPI      8. Lumbar compression fracture:  s/p L3-L5 fusion 02/03/16. --back pain resolved 9. 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  Continue atorvastatin. (also warfarin INR goal 2-2.5). S/p R CEA 01/13/16. Continue plavix.  11. H/o amio-induced hypethyroidism:  --Followed by Dr. Forde Dandy in past.  --Amio restarted peri-operatively at low dose due to recurrent atrial arrhythmias and VT.  12. VT  - underwent DC-CV 9/17. Continue amio. Remains quiescent.    Follow up 2 months.   Amy Clegg NP-C  10:03 AM

## 2017-05-20 NOTE — Progress Notes (Signed)
Patient presents for 2 mo follow up in [facility] today. Reports no problems with VAD equipment or concerns with drive line. Pt is having speed drops to 8300 from 8800 while in clinic today. Log files sent to [facility].   Vital Signs:  Doppler Pressure: 82 Automatc BP: 108/64 (835 HR:69  SPO2:97  %  Weight: 208 lb w/o eqt Last weight: 207 lb Home weights: 200 lbs   VAD Indication: DT - age excluding  VAD interrogation & Equipment Management: Speed:8800 Flow: 4.0 Power:4.5 w    PI: 6.9  Alarms: no clinical alarms Events: 15-20 PI events daily  Fixed speed 8800 Low speed limit: 8200  Primary Controller:  Replace back up battery in 14 months. Back up controller:   Replace back up battery in 14 months.  Annual Equipment Maintenance on UBC/PM was performed on 10/2016.   I reviewed the LVAD parameters from today and compared the results to the patient's prior recorded data. LVAD interrogation was NEGATIVE for significant power changes, NEGATIVE for clinical alarms and STABLE for PI events/speed drops. No programming changes were made and pump is functioning within specified parameters. Pt is performing daily controller and system monitor self tests along with completing weekly and monthly maintenance for LVAD equipment.  LVAD equipment check completed and is in good working order. Back-up equipment present. Charged back up battery and performed self-test on equipment.   Exit Site Care: Drive line is being maintained weekly  by [name]. Drive line exit site well healed and incorporated. The velour is fully implanted at exit site. Dressing dry and intact. No erythema or drainage. Stabilization device present and accurately applied. Pt denies fever or chills. Pt was given 4 dressing kits while in clinic today.   Significant Events on VAD Support:  02/2015 >> GIB (removed ASA, 2-2.5 INR) 10/2015 >> CVA (Plavix started) 02/2017> GIB (removed ASA, scopes  negative)  Device:Medtronic Therapies: on Last check: unknown   BP & Labs:  MAP 82 - Doppler is reflecting modofied systolic  Hgb 84.6 - No S/S of bleeding. Specifically denies melena/BRBPR or nosebleeds.  LDH stable at 318 with established baseline of 280. Denies tea-colored urine. No power elevations noted on interrogation.   2.5 year Intermacs follow up completed including:  Quality of Life, KCCQ-12, and Neurocognitive trail making.   Pt was unable to complete a full 6 minute walk. Pt did 400 feet in 2 min 20 secs.  Back up controller:  11V backup battery charged during this visit.  Plan:  1. Return to clinic in 2 months  Tanda Rockers RN [place] Coordinator   Office: [phone] 24/7 Emergency [place] Pager: [phone]

## 2017-05-21 ENCOUNTER — Ambulatory Visit (HOSPITAL_COMMUNITY): Payer: Self-pay | Admitting: Pharmacist

## 2017-05-21 LAB — POCT INR: INR: 2.2

## 2017-05-27 ENCOUNTER — Telehealth (HOSPITAL_COMMUNITY): Payer: Self-pay | Admitting: Unknown Physician Specialty

## 2017-05-27 DIAGNOSIS — I4819 Other persistent atrial fibrillation: Secondary | ICD-10-CM

## 2017-05-27 MED ORDER — METOPROLOL SUCCINATE ER 25 MG PO TB24: 25.0000 mg | ORAL_TABLET | Freq: Two times a day (BID) | ORAL | 3 refills | Status: DC

## 2017-05-27 NOTE — Telephone Encounter (Signed)
Pt called requesting a refill on metoprolol. Refill provided.

## 2017-05-28 ENCOUNTER — Ambulatory Visit (HOSPITAL_COMMUNITY): Payer: Self-pay | Admitting: Pharmacist

## 2017-05-28 LAB — POCT INR: INR: 2.2

## 2017-06-04 ENCOUNTER — Other Ambulatory Visit (HOSPITAL_COMMUNITY): Payer: Self-pay | Admitting: Internal Medicine

## 2017-06-04 ENCOUNTER — Ambulatory Visit (HOSPITAL_COMMUNITY): Payer: Self-pay | Admitting: Pharmacist

## 2017-06-04 ENCOUNTER — Telehealth: Payer: Self-pay | Admitting: Unknown Physician Specialty

## 2017-06-04 LAB — POCT INR: INR: 2.5

## 2017-06-04 MED ORDER — RANOLAZINE ER 500 MG PO TB12: ORAL_TABLET | ORAL | 6 refills | Status: DC

## 2017-06-04 NOTE — Telephone Encounter (Signed)
Received paged regarding [name]'s INR and request for Ranexa script.

## 2017-06-11 ENCOUNTER — Ambulatory Visit (HOSPITAL_COMMUNITY): Payer: Self-pay | Admitting: Pharmacist

## 2017-06-11 LAB — POCT INR: INR: 2.7

## 2017-06-18 ENCOUNTER — Ambulatory Visit (HOSPITAL_COMMUNITY): Payer: Self-pay | Admitting: Pharmacist

## 2017-06-18 LAB — POCT INR: INR: 2.8

## 2017-06-25 ENCOUNTER — Ambulatory Visit (HOSPITAL_COMMUNITY): Payer: Self-pay | Admitting: Pharmacist

## 2017-06-25 LAB — POCT INR: INR: 2.9

## 2017-07-02 ENCOUNTER — Ambulatory Visit (HOSPITAL_COMMUNITY): Payer: Self-pay | Admitting: Pharmacist

## 2017-07-02 LAB — POCT INR: INR: 2.3

## 2017-07-03 ENCOUNTER — Ambulatory Visit (INDEPENDENT_AMBULATORY_CARE_PROVIDER_SITE_OTHER): Payer: Medicare Other | Admitting: *Deleted

## 2017-07-03 DIAGNOSIS — I472 Ventricular tachycardia, unspecified: Secondary | ICD-10-CM

## 2017-07-04 NOTE — Progress Notes (Signed)
Remote ICD transmission.   

## 2017-07-09 ENCOUNTER — Ambulatory Visit (HOSPITAL_COMMUNITY): Payer: Self-pay | Admitting: Pharmacist

## 2017-07-09 LAB — POCT INR: INR: 2.7

## 2017-07-16 ENCOUNTER — Ambulatory Visit (HOSPITAL_COMMUNITY): Payer: Self-pay | Admitting: Pharmacist

## 2017-07-16 ENCOUNTER — Encounter: Payer: Self-pay | Admitting: Cardiology

## 2017-07-16 LAB — POCT INR: INR: 1.6

## 2017-07-17 ENCOUNTER — Telehealth (HOSPITAL_COMMUNITY): Payer: Self-pay | Admitting: *Deleted
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
## 2017-07-18 ENCOUNTER — Ambulatory Visit (HOSPITAL_COMMUNITY): Payer: Self-pay | Admitting: Pharmacist

## 2017-07-18 LAB — POCT INR: INR: 1.8

## 2017-07-19 ENCOUNTER — Other Ambulatory Visit (HOSPITAL_COMMUNITY): Payer: Self-pay | Admitting: Internal Medicine

## 2017-07-19 LAB — CUP PACEART REMOTE DEVICE CHECK
Battery Remaining Longevity: 52 mo
Brady Statistic AP VP Percent: 0.18 %
Brady Statistic AP VS Percent: 63.27 %
Brady Statistic AS VS Percent: 36.48 %
Brady Statistic RV Percent Paced: 0.34 %
HighPow Impedance: 38 Ohm
HighPow Impedance: 47 Ohm
Implantable Lead Implant Date: 20030714
Implantable Lead Implant Date: 20140801
Implantable Lead Model: 6944
Lead Channel Impedance Value: 399 Ohm
Lead Channel Pacing Threshold Amplitude: 1 V
Lead Channel Pacing Threshold Pulse Width: 0.4 ms
Lead Channel Pacing Threshold Pulse Width: 0.4 ms
Lead Channel Sensing Intrinsic Amplitude: 1.625 mV
Lead Channel Sensing Intrinsic Amplitude: 1.625 mV
Lead Channel Sensing Intrinsic Amplitude: 2.25 mV
Lead Channel Setting Pacing Amplitude: 2 V
Lead Channel Setting Pacing Amplitude: 2 V
MDC IDC LEAD LOCATION: 753859
MDC IDC LEAD LOCATION: 753860
MDC IDC MSMT BATTERY VOLTAGE: 2.98 V
MDC IDC MSMT LEADCHNL RV IMPEDANCE VALUE: 456 Ohm
MDC IDC MSMT LEADCHNL RV IMPEDANCE VALUE: 589 Ohm
MDC IDC MSMT LEADCHNL RV PACING THRESHOLD AMPLITUDE: 1 V
MDC IDC MSMT LEADCHNL RV SENSING INTR AMPL: 2.25 mV
MDC IDC PG IMPLANT DT: 20140721
MDC IDC SESS DTM: 20180829111011
MDC IDC SET LEADCHNL RV PACING PULSEWIDTH: 0.4 ms
MDC IDC SET LEADCHNL RV SENSING SENSITIVITY: 0.3 mV
MDC IDC STAT BRADY AS VP PERCENT: 0.07 %
MDC IDC STAT BRADY RA PERCENT PACED: 61.76 %

## 2017-07-22 ENCOUNTER — Encounter (HOSPITAL_COMMUNITY): Payer: Medicare Other

## 2017-07-22 ENCOUNTER — Other Ambulatory Visit (HOSPITAL_COMMUNITY): Payer: Self-pay | Admitting: *Deleted

## 2017-07-22 DIAGNOSIS — Z95811 Presence of heart assist device: Secondary | ICD-10-CM

## 2017-07-23 ENCOUNTER — Ambulatory Visit (HOSPITAL_COMMUNITY)
Admission: RE | Admit: 2017-07-23 | Discharge: 2017-07-23 | Disposition: A | Discharge status: Home / Self Care | Payer: Medicare Other | Source: Ambulatory Visit | Attending: Cardiology | Admitting: Cardiology

## 2017-07-23 ENCOUNTER — Encounter (HOSPITAL_COMMUNITY): Payer: Self-pay

## 2017-07-23 ENCOUNTER — Ambulatory Visit (HOSPITAL_COMMUNITY): Payer: Self-pay | Admitting: Pharmacist

## 2017-07-23 ENCOUNTER — Encounter (HOSPITAL_COMMUNITY): Payer: Self-pay | Admitting: Unknown Physician Specialty

## 2017-07-23 VITALS — BP 104/42 | HR 79 | Resp 12 | Ht 72.0 in | Wt 205.6 lb

## 2017-07-23 DIAGNOSIS — G4730 Sleep apnea, unspecified: Secondary | ICD-10-CM | POA: Diagnosis not present

## 2017-07-23 DIAGNOSIS — Z87442 Personal history of urinary calculi: Secondary | ICD-10-CM | POA: Diagnosis not present

## 2017-07-23 DIAGNOSIS — Z85820 Personal history of malignant melanoma of skin: Secondary | ICD-10-CM | POA: Insufficient documentation

## 2017-07-23 DIAGNOSIS — I130 Hypertensive heart and chronic kidney disease with heart failure and stage 1 through stage 4 chronic kidney disease, or unspecified chronic kidney disease: Secondary | ICD-10-CM | POA: Diagnosis not present

## 2017-07-23 DIAGNOSIS — M1990 Unspecified osteoarthritis, unspecified site: Secondary | ICD-10-CM | POA: Diagnosis not present

## 2017-07-23 DIAGNOSIS — N183 Chronic kidney disease, stage 3 unspecified: Secondary | ICD-10-CM

## 2017-07-23 DIAGNOSIS — Z981 Arthrodesis status: Secondary | ICD-10-CM | POA: Insufficient documentation

## 2017-07-23 DIAGNOSIS — I10 Essential (primary) hypertension: Secondary | ICD-10-CM

## 2017-07-23 DIAGNOSIS — I252 Old myocardial infarction: Secondary | ICD-10-CM | POA: Diagnosis not present

## 2017-07-23 DIAGNOSIS — Z8673 Personal history of transient ischemic attack (TIA), and cerebral infarction without residual deficits: Secondary | ICD-10-CM | POA: Insufficient documentation

## 2017-07-23 DIAGNOSIS — I480 Paroxysmal atrial fibrillation: Secondary | ICD-10-CM | POA: Insufficient documentation

## 2017-07-23 DIAGNOSIS — I5022 Chronic systolic (congestive) heart failure: Secondary | ICD-10-CM | POA: Diagnosis present

## 2017-07-23 DIAGNOSIS — Z95810 Presence of automatic (implantable) cardiac defibrillator: Secondary | ICD-10-CM | POA: Diagnosis not present

## 2017-07-23 DIAGNOSIS — E039 Hypothyroidism, unspecified: Secondary | ICD-10-CM | POA: Insufficient documentation

## 2017-07-23 DIAGNOSIS — E785 Hyperlipidemia, unspecified: Secondary | ICD-10-CM | POA: Diagnosis not present

## 2017-07-23 DIAGNOSIS — E0580 Other thyrotoxicosis without thyrotoxic crisis or storm: Secondary | ICD-10-CM

## 2017-07-23 DIAGNOSIS — I714 Abdominal aortic aneurysm, without rupture: Secondary | ICD-10-CM | POA: Insufficient documentation

## 2017-07-23 DIAGNOSIS — I2510 Atherosclerotic heart disease of native coronary artery without angina pectoris: Secondary | ICD-10-CM | POA: Diagnosis not present

## 2017-07-23 DIAGNOSIS — Z95811 Presence of heart assist device: Secondary | ICD-10-CM

## 2017-07-23 DIAGNOSIS — Z5181 Encounter for therapeutic drug level monitoring: Secondary | ICD-10-CM

## 2017-07-23 DIAGNOSIS — M4856XA Collapsed vertebra, not elsewhere classified, lumbar region, initial encounter for fracture: Secondary | ICD-10-CM | POA: Insufficient documentation

## 2017-07-23 DIAGNOSIS — Z79899 Other long term (current) drug therapy: Secondary | ICD-10-CM | POA: Diagnosis not present

## 2017-07-23 DIAGNOSIS — Z7902 Long term (current) use of antithrombotics/antiplatelets: Secondary | ICD-10-CM | POA: Diagnosis not present

## 2017-07-23 DIAGNOSIS — T462X5A Adverse effect of other antidysrhythmic drugs, initial encounter: Secondary | ICD-10-CM

## 2017-07-23 DIAGNOSIS — Z7901 Long term (current) use of anticoagulants: Secondary | ICD-10-CM | POA: Diagnosis not present

## 2017-07-23 DIAGNOSIS — I471 Supraventricular tachycardia: Secondary | ICD-10-CM | POA: Insufficient documentation

## 2017-07-23 DIAGNOSIS — Z9889 Other specified postprocedural states: Secondary | ICD-10-CM | POA: Diagnosis not present

## 2017-07-23 DIAGNOSIS — I255 Ischemic cardiomyopathy: Secondary | ICD-10-CM | POA: Insufficient documentation

## 2017-07-23 LAB — CBC
HCT: 36.4 % — ABNORMAL LOW (ref 39.0–52.0)
HEMOGLOBIN: 11.8 g/dL — AB (ref 13.0–17.0)
MCH: 30.4 pg (ref 26.0–34.0)
MCHC: 32.4 g/dL (ref 30.0–36.0)
MCV: 93.8 fL (ref 78.0–100.0)
PLATELETS: 162 10*3/uL (ref 150–400)
RBC: 3.88 MIL/uL — ABNORMAL LOW (ref 4.22–5.81)
RDW: 16.1 % — AB (ref 11.5–15.5)
WBC: 8.6 10*3/uL (ref 4.0–10.5)

## 2017-07-23 LAB — PROTIME-INR
INR: 1.77
PROTHROMBIN TIME: 20.4 s — AB (ref 11.4–15.2)

## 2017-07-23 LAB — HEPATIC FUNCTION PANEL
ALBUMIN: 4 g/dL (ref 3.5–5.0)
ALT: 20 U/L (ref 17–63)
AST: 29 U/L (ref 15–41)
Alkaline Phosphatase: 109 U/L (ref 38–126)
BILIRUBIN DIRECT: 0.1 mg/dL (ref 0.1–0.5)
BILIRUBIN INDIRECT: 0.7 mg/dL (ref 0.3–0.9)
BILIRUBIN TOTAL: 0.8 mg/dL (ref 0.3–1.2)
Total Protein: 7.3 g/dL (ref 6.5–8.1)

## 2017-07-23 LAB — BASIC METABOLIC PANEL
ANION GAP: 4 — AB (ref 5–15)
BUN: 37 mg/dL — ABNORMAL HIGH (ref 6–20)
CHLORIDE: 107 mmol/L (ref 101–111)
CO2: 25 mmol/L (ref 22–32)
CREATININE: 1.39 mg/dL — AB (ref 0.61–1.24)
Calcium: 9.3 mg/dL (ref 8.9–10.3)
GFR calc Af Amer: 55 mL/min — ABNORMAL LOW (ref >60)
GFR calc non Af Amer: 47 mL/min — ABNORMAL LOW (ref >60)
Glucose, Bld: 114 mg/dL — ABNORMAL HIGH (ref 65–99)
POTASSIUM: 4.8 mmol/L (ref 3.5–5.1)
SODIUM: 136 mmol/L (ref 135–145)

## 2017-07-23 LAB — LACTATE DEHYDROGENASE: LDH: 350 U/L — ABNORMAL HIGH (ref 98–192)

## 2017-07-23 NOTE — Patient Instructions (Signed)
Return to clinic in 1 week for labs. Adjust coumadin as discussed in clinic.

## 2017-07-23 NOTE — Progress Notes (Signed)
Patient ID: [name], male   DOB: 1941/05/03, 76 y.o.   MRN: [id number]   VAD CLINIC PROGRESS NOTE  PCP: Dr. Kandice Robinsons ([place]) GU: Dr Risa Grill  Neuro Surgeon: Dr Vertell Limber Vascular: Dr Kellie Simmering Primary HF: Dr Haroldine Laws  [name] is a 76 yo male with h/o VT, PAD and severe systolic HF (EF 76-16%) due to mixed ischemic/nonischemic CM he is s/p HM II LVAD implant for destination therapy on October 19, 2013.VAD implantation was complicated by VT, syncope, AF/Aflutter and RHF. Required short term milrinone.   GU Event 09/01/14 had impacted ureteral stone and underwent cystoscopy and flexible ureteroscopy with lithotripsy and basket extraction.   09/2015 Hematuria and chronic nephrolithiasis. Renal US showed mild hyronephrosis and bilateral renal cysts. Dr. Risa Grill took back for repeat lithotripsy and stone extraction by ureteroscopy and J stent placement.  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  4/2-18 - Episode of melena. Hgb 10.9-> 8.7. 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  01/2016 R CEA. He was started on Plavix.  01/2016 Lumbar fusion L3-L5 with pedicle screws posterolateral arthrodesis with BMP, allograft  VAD Pump Event:  03/2017 - Pump stop. Driveline repair 05/3709 Speed turned down to 8800 due to RV failure. .   Today he returns for routine VAD follow up. Overall feeling fine. Ongoing back pain. On most days he walks a total of 1 mile.  Denies PND/Orthopnea. SOB with steps and inclines. Appetite ok. No fever or chills. Weight at home has been stable.  Taking all medications. Dressing is being changed weekly by his wife. He denies LVAD issues. No pump stops.   Follow up LVAD Clinic: VAD Indication: DT - age excluding  VAD interrogation & Equipment Management: Speed: 8800 while in the clininc speed dropped to 8300.  Flow: 3.2 Power: 4.2 PI: 6.1   Alarms: no clinical  alarms Events: On average ~10 PI events daily. On 9/11 had 40 PI events 9/10 had 24  Fixed speed 8800 Low speed limit: 8200  Primary Controller: Replace back up battery  Back up controller: Replace back up battery     Past Medical History:  Diagnosis Date  . Arthritis   . Automatic implantable cardioverter-defibrillator in situ    a. s/p Medtronic Evera device serial [id number]    . Bradycardia   . CAD (coronary artery disease)    a. s/p MI 1982;  s/p DES to CFX 2011;  s/p NSTEMI 4/12 in setting of VTach;  cath 7/12:  LM ok, LAD mid 30-40%, Dx's with 40%, mCFX stent patent, prox to mid RCA occluded with R to R and L to R collats.  Medical Tx was continued  . Chronic systolic heart failure ([place])    a. s/p LVAD 10/2013 for destination therapy  . CKD (chronic kidney disease)   . CVD (cerebrovascular disease)    a. s/p L CEA 2008  . Cytopenia   . Dysrhythmia    AFIB  . History of GI bleed    a. on ASA- started on plavix during 10/2015 admission for CVA  . HLD (hyperlipidemia)   . HTN (hypertension)   . Hypothyroidism   . Inappropriate therapy from implantable cardioverter-defibrillator    a. ATP for sinus tach SVT wavelet identified SVT but was no  passive (2014)  . Ischemic cardiomyopathy   . 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 L Ear   .  Myocardial infarction ([place])    1992  . PAF (paroxysmal atrial fibrillation) ([place])   . PVD (peripheral vascular disease) ([place])    a. 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 Shortness of breath dyspnea    W/ EXERTION   . Sleep apnea    NO CPAP NOW  HE SENT IT BACK YRS AGO  . Stroke [facility])    a. 10/2015 admission   . Ventricular tachycardia ([place])    a. s/p AICD;   h/o ICD shock;  Amiodarone Rx. S/P ablation in 2012    Current Outpatient Prescriptions  Medication Sig Dispense Refill  . amiodarone (PACERONE) 200 MG tablet Take 1 tablet (200 mg total)  by mouth daily. 90 tablet 3  . atorvastatin (LIPITOR) 80 MG tablet TAKE 1 TABLET DAILY AT 6 PM 30 tablet 11  . clopidogrel (PLAVIX) 75 MG tablet Take 1 tablet (75 mg total) by mouth daily. 90 tablet 3  . docusate sodium (COLACE) 100 MG capsule Take 200 mg by mouth daily as needed for mild constipation.     . furosemide (LASIX) 20 MG tablet Take 1 tablet (20 mg total) by mouth as needed for edema (weight gain). 12 tablet 3  . levothyroxine (SYNTHROID, LEVOTHROID) 200 MCG tablet Take 200 mcg (1 tab) by mouth daily except take 300 mcg (1 and 1/2 tab) by mouth every Monday.    . metoprolol succinate (TOPROL-XL) 25 MG 24 hr tablet Take 1 tablet (25 mg total) by mouth 2 (two) times daily. 180 tablet 3  . Multiple Vitamins-Minerals (ICAPS PO) Take 1 tablet by mouth 2 (two) times daily.    . nitroGLYCERIN (NITROSTAT) 0.4 MG SL tablet Place 0.4 mg under the tongue every 5 (five) minutes as needed for chest pain.    . pantoprazole (PROTONIX) 40 MG tablet Take 1 tablet (40 mg total) by mouth 2 (two) times daily. 180 tablet 6  . promethazine (PHENERGAN) 12.5 MG tablet Take 12.5 mg by mouth every 6 (six) hours as needed for nausea or vomiting.    [name] RANEXA 500 MG 12 hr tablet TAKE 1 TABLET TWICE DAILY 60 tablet 6  . ranolazine (RANEXA) 500 MG 12 hr tablet TAKE 1 TABLET (500 MG TOTAL) BY MOUTH 2 (TWO) TIMES DAILY. 60 tablet 6  . warfarin (COUMADIN) 2 MG tablet Take 2-4 mg by mouth daily. Take 2 mg (1 tab) daily except 4 mg (2 tablets) on Tues, Thurs, Sat.    . zinc gluconate 50 MG tablet Take 50 mg by mouth 2 (two) times daily.      No current facility-administered medications for this encounter.     Demerol [meperidine]  REVIEW OF SYSTEMS: All systems negative except as listed in HPI, PMH and Problem list.  Vital Signs:  Doppler Pressure 70 Automatc BP: 104/42 (70)   HR 79 SPO2: 95  Weight: 2205.6 lbs Last weight: 208 lbs Home weights: 200-201  lbs  Physical Exam: GENERAL: Well appearing, male  who presents to clinic today in no acute distress. Ambulated in the clinic without difficulty.  HEENT: normal  NECK: Supple, JVP 5-6  .  2+ bilaterally, no bruits.  No lymphadenopathy or thyromegaly appreciated.  Bilateral neck scar CARDIAC:  Mechanical heart sounds with LVAD hum present.  LUNGS:  Clear to auscultation bilaterally.  ABDOMEN:  Soft, round, nontender, positive bowel sounds x4.  LVAD exit site: well-healed and incorporated.  Dressing dry and intact.  No erythema or drainage.  Stabilization device present and accurately applied.  Driveline dressing is being changed daily per sterile technique. EXTREMITIES:  Warm and dry, no cyanosis, clubbing, rash or edema  NEUROLOGIC:  Alert and oriented x 4.  Gait steady.  No aphasia.  No dysarthria.  Affect pleasant.      ASSESSMENT AND PLAN:  1. Chronic systolic HF: s/p HM-II LVAD 10/2014.   NYHA II. Volume status stable. He does not require diuretics.  2.  Atrial arrhythmias: Atrial fibrillation, atrial tachycardia.  S/p DC-CV 06/04/14, DC-CV 03/02/15 and 01/19/16.   Regular pulse. Continue amio to 200 mg daily.   Check TSH next week. .  3. HTN:  Stable. Continue current regimen. 4. LVAD:  - - 03/26/2017 Underwent recent driveline repair. No further pump stops.  Parameters reviewed. Stable. No power spikes  DH trendResults for ZAVEON, GILLEN (MRN [id number]) as of 07/23/2017 10:44  Ref. Range 03/29/2017 02:54 04/04/2017 11:55 05/20/2017 10:25 07/23/2017 09:53  LDH Latest Ref Range: 98 - 192 U/L 239 (H) 263 (H) 318 (H) 350 (H)   LDH trending up. Repeat LDH next week.  5. CKD, stage III:   Renal function stable. Creatinine 1.39.  6. Anticoagulation management:  --Goal INR 2-2.5. INR 1.77 Discussed with Pharm D.  Subtherapeutic INR 9/11, 9/13 and 9/18.   He is off ASA with GI bleeding. Continue Plavix.  --7. H/o GIB:  --s/p clipping of 2 jejunal AVMs in 4/16. Continue coumadin and plavix.(with h/o CVA). --Admitted 4/18 with recurrent  GIB. Transfused 2u. EGD/colon negative. Hgb stable today 11.8 --.  Continue PPI      8. Lumbar compression fracture:  s/p L3-L5 fusion 02/03/16. --back pain resolved 9. 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  Continue atorvastatin. (also warfarin INR goal 2-2.5). S/p R CEA 01/13/16. Continue plavix.  11. H/o amio-induced hypethyroidism:  --Followed by Dr. Forde Dandy in past.  --Amio restarted peri-operatively at low dose due to recurrent atrial arrhythmias and VT.  -Check TSH in 2 weeks.  12. VT  - underwent DC-CV 9/17. Continue amio. Remains quiescent. TSH in 2 weeks.    LDH trendResults for CORDARIUS, BENNING (MRN [id number]) as of 07/23/2017 10:44  Ref. Range 03/29/2017 02:54 04/04/2017 11:55 05/20/2017 10:25 07/23/2017 09:53  LDH Latest Ref Range: 98 - 192 U/L 239 (H) 263 (H) 318 (H) 350 (H)  Results for RENDER, MARLEY (MRN [id number]) as of 07/23/2017 10:44  Ref. Range 07/02/2017 00:00 07/09/2017 00:00 07/16/2017 00:00 07/18/2017 00:00 07/23/2017 09:53  INR Unknown 2.3 2.7 1.6 1.8 1.77   Obtain CBC, BMET, INR, LDH today. I have reviewed the results with him today.   Follow up next week to repeat INR, LDH, and TSH. I have told him if LDH continues to rise he will need hospital admit for suspected LVAD thrombosis. He is not having HF symptoms.   Greater than 50% of the 40 minutes visit spent in counseling/coordination of care regarding lab results, elevated LDH, and pneumonia/flu vaccine.  See LVAD Coordinator note below.  Patient presents for 2 month  follow up in [facility] today. Reports no problems with VAD equipment or concerns with drive line.  Vital Signs:  Doppler Pressure 70                Automatc BP: 104/42 (70) HR:79  SPO2:95  %  Weight: 205.6 lb w/o eqt Last weight: 208 lb Home weights: 205-210 lbs  VAD Indication: Destination Therapy- age excluding  VAD interrogation & Equipment Management: Speed:8800 Flow: 3.2 Power:4.2 w PI:6.1  Alarms: no  clinical alarms Events: 5-10 PI events daily  Fixed speed 8800 Low speed limit: 8200  Primary Controller: Replace back up battery in 54months. Back up controller: Replace back up battery in 46months.  Annual Equipment Maintenance on UBC/PM was performed on AT NEXT VISIT.  I reviewed the LVAD parameters from todayand compared the results to the patient's prior recorded data.LVAD interrogation was NEGATIVEfor significant power changes, NEGATIVEfor clinicalalarms and STABLEfor PI events/speed drops. No programming changes were madeand pump is functioning within specified parameters. Pt is performing daily controller and system monitor self tests along with completing weekly and monthly maintenance for LVAD equipment.  LVAD equipment check completed and is in good working order. Back-up equipment present. Charged back up battery and performed self-test on equipment.   Exit Site Care: Drive line is being maintained weekly by [name]. Drive line exit site well healed and incorporated. The velour is fully implanted at exit site. Dressing dry and intact. No erythema or drainage. Stabilization device present and accurately applied. Pt denies fever or chills. 8 weekly dressing kits given.  Significant Events on VAD Support:  02/2015 >>GIB (removed ASA, 2-2.5 INR) 10/2015 >>CVA(Plavix started) 02/2017> GIB (removed ASA, scopes negative) 03/2017> percutaneous lead repair  Device:Medtronic Therapies: on Last check: followed at device clinic, Dr Caryl Comes   BP &Labs:  MAP70- Doppler is reflecting MAP  Hgb 11.8- No S/S of bleeding. Specifically denies melena/BRBPR or nosebleeds.  LDH ELEVATED at 350with established baseline of 250- 300. Denies tea-colored urine. No power elevations noted on interrogation.   Plan: 1. Return to clinic in 1 week for ldh/tsh/inr 2. Annual maintenance due at next clinic visit  Balinda Quails RN [place] Coordinator   Office:  [phone] 24/7 Emergency VAD Pager: [phone]  Amy Clegg NP-C  9:59 AM

## 2017-07-23 NOTE — Progress Notes (Signed)
Patient presents for 2 month  follow up in [facility] today. Reports no problems with VAD equipment or concerns with drive line.  Vital Signs:  Doppler Pressure 70   Automatc BP: 104/42 (70) HR:79   SPO2:95  %  Weight: 205.6 lb w/o eqt Last weight: 208 lb Home weights: 205-210 lbs   VAD Indication: Destination Therapy- age excluding  VAD interrogation & Equipment Management: Speed:8800 Flow: 3.2 Power:4.2 w    PI:6.1  Alarms: no clinical alarms Events: 5-10 PI events daily  Fixed speed 8800 Low speed limit: 8200  Primary Controller:  Replace back up battery in 47months. Back up controller:   Replace back up battery in 12 months.  Annual Equipment Maintenance on UBC/PM was performed on AT NEXT VISIT.   I reviewed the LVAD parameters from today and compared the results to the patient's prior recorded data. LVAD interrogation was NEGATIVE for significant power changes, NEGATIVE for clinical alarms and STABLE for PI events/speed drops. No programming changes were made and pump is functioning within specified parameters. Pt is performing daily controller and system monitor self tests along with completing weekly and monthly maintenance for LVAD equipment.  LVAD equipment check completed and is in good working order. Back-up equipment present. Charged back up battery and performed self-test on equipment.   Exit Site Care: Drive line is being maintained weekly  by [name]. Drive line exit site well healed and incorporated. The velour is fully implanted at exit site. Dressing dry and intact. No erythema or drainage. Stabilization device present and accurately applied. Pt denies fever or chills. 8 weekly dressing kits given.  Significant Events on VAD Support:  02/2015 >>GIB (removed ASA, 2-2.5 INR) 10/2015 >>CVA(Plavix started) 02/2017> GIB (removed ASA, scopes negative) 03/2017> percutaneous lead repair  Device:Medtronic Therapies: on Last check: followed at device  clinic, Dr Caryl Comes   BP & Labs:  MAP70 - Doppler is reflecting MAP  Hgb 11.8 - No S/S of bleeding. Specifically denies melena/BRBPR or nosebleeds.  LDH ELEVATED at 350 with established baseline of 250- 300. Denies tea-colored urine. No power elevations noted on interrogation.   Plan: 1. Return to clinic in 1 week for ldh/tsh/inr 2. Annual maintenance due at next clinic visit  Balinda Quails RN [place] Coordinator   Office: [phone] 24/7 Emergency VAD Pager: [phone]

## 2017-07-26 ENCOUNTER — Other Ambulatory Visit (HOSPITAL_COMMUNITY): Payer: Self-pay | Admitting: Unknown Physician Specialty

## 2017-07-26 DIAGNOSIS — Z7901 Long term (current) use of anticoagulants: Secondary | ICD-10-CM

## 2017-07-26 DIAGNOSIS — Z95811 Presence of heart assist device: Secondary | ICD-10-CM

## 2017-07-29 ENCOUNTER — Telehealth (HOSPITAL_COMMUNITY): Payer: Self-pay | Admitting: *Deleted

## 2017-07-29 NOTE — Telephone Encounter (Signed)
Patient made aware of [employer] which applies to [employer] XS PT test strips that are used with all [employer] patient self-testing instruments. Plans made to have INR checked at our clinic or local laboratory until replacement test strips are delivered. Informed patient that md-INR will be contacting them with further details and instructions.   Lesley Wilson RN, VAD Coordinator 24/7 pager [phone]   

## 2017-07-30 ENCOUNTER — Ambulatory Visit (HOSPITAL_COMMUNITY): Payer: Self-pay | Admitting: Pharmacist

## 2017-07-30 ENCOUNTER — Ambulatory Visit (HOSPITAL_COMMUNITY)
Admission: RE | Admit: 2017-07-30 | Discharge: 2017-07-30 | Disposition: A | Discharge status: Home / Self Care | Payer: Medicare Other | Source: Ambulatory Visit | Attending: Cardiology | Admitting: Cardiology

## 2017-07-30 DIAGNOSIS — Z95811 Presence of heart assist device: Secondary | ICD-10-CM | POA: Insufficient documentation

## 2017-07-30 DIAGNOSIS — Z7901 Long term (current) use of anticoagulants: Secondary | ICD-10-CM

## 2017-07-30 LAB — PROTIME-INR
INR: 2.2
PROTHROMBIN TIME: 24.3 s — AB (ref 11.4–15.2)

## 2017-07-30 LAB — LACTATE DEHYDROGENASE: LDH: 331 U/L — ABNORMAL HIGH (ref 98–192)

## 2017-07-30 LAB — TSH: TSH: 0.53 u[IU]/mL (ref 0.350–4.500)

## 2017-07-31 ENCOUNTER — Other Ambulatory Visit (HOSPITAL_COMMUNITY): Payer: Medicare Other

## 2017-08-01 ENCOUNTER — Telehealth (HOSPITAL_COMMUNITY): Payer: Self-pay | Admitting: *Deleted

## 2017-08-01 NOTE — Telephone Encounter (Signed)
Pt called VAD pager to report new onset of diarrhea that started this am around 8 am. Pt reports he has had 4 watery stools and is very weak, lightheaded with orthostatic changes, and "can't breathe". States his home weightts remain between 194 - 196 lbs. VAD parameters: 8800 RPM, flow 3.6, PI 7.2, power 4.3w with no VAD alarms. Pt denies any fevers (temp this am was 97.4), chills, cough, pedal edema, abdominal distention, palpitations, or orthopnea/PND. He is drinking water and keeping it down at this time.   Updated Dr. Haroldine Laws - informed pt to drink fluid with electrolytes (Pedialyte/Gatorade). If pt can keep up with fluid intake continue to drink. If pt can't keep fluids down or he can't drink enough to replace his fluid loss, he will need to call VAD pager and plan on coming to [facility] ED for IV fluid. Asked pt to be very careful if unsteady on feet to prevent falls. Advised to have his wife come home from work to help him if he continues to be weak. Pt verbalized understanding of above.  Called [name] and updated her with Dr. Clayborne Dana instructions above. She verbalized understanding of same.

## 2017-08-06 ENCOUNTER — Telehealth (HOSPITAL_COMMUNITY): Payer: Self-pay | Admitting: Unknown Physician Specialty

## 2017-08-06 ENCOUNTER — Other Ambulatory Visit: Payer: Self-pay | Admitting: Internal Medicine

## 2017-08-06 LAB — PROTIME-INR: INR: 2.2 — AB (ref <=1.1)

## 2017-08-06 NOTE — Telephone Encounter (Signed)
Pts wife called back to say that the [facility] in [place] is no longer open. New orders were faxed to the [facility] at [phone].  Tanda Rockers RN, BSN VAD Coordinator Pager [phone] (7am - 7am)

## 2017-08-06 NOTE — Telephone Encounter (Signed)
Wife called to inform us that [facility] called regarding INR test strips. [facility] states that all of [place] INR strips are recalled. [place] will need an INR today. Standing orders faxed to [facility] in [place] at [phone].  Tanda Rockers RN, BSN VAD Coordinator Pager [phone] (7am - 7am)

## 2017-08-07 ENCOUNTER — Ambulatory Visit (HOSPITAL_COMMUNITY): Payer: Self-pay | Admitting: Pharmacist

## 2017-08-07 LAB — PROTIME-INR
INR: 2.2 — AB (ref 0.8–1.2)
PROTHROMBIN TIME: 21.9 s — AB (ref 9.1–12.0)

## 2017-08-13 ENCOUNTER — Other Ambulatory Visit: Payer: Self-pay | Admitting: Internal Medicine

## 2017-08-13 LAB — PROTIME-INR: INR: 3.2 — AB (ref <=1.1)

## 2017-08-14 ENCOUNTER — Ambulatory Visit (HOSPITAL_COMMUNITY): Payer: Self-pay | Admitting: Pharmacist

## 2017-08-14 LAB — PROTIME-INR
INR: 3.2 — ABNORMAL HIGH (ref 0.8–1.2)
Prothrombin Time: 31.5 s — ABNORMAL HIGH (ref 9.1–12.0)

## 2017-08-17 ENCOUNTER — Encounter (HOSPITAL_COMMUNITY): Payer: Self-pay

## 2017-08-17 ENCOUNTER — Emergency Department (HOSPITAL_COMMUNITY): Payer: Medicare Other

## 2017-08-17 ENCOUNTER — Inpatient Hospital Stay (HOSPITAL_COMMUNITY)
Admission: EM | Admit: 2017-08-17 | Discharge: 2017-08-19 | DRG: 291 | Disposition: A | Discharge status: Home / Self Care | Payer: Medicare Other | Attending: Internal Medicine | Admitting: Internal Medicine

## 2017-08-17 DIAGNOSIS — N189 Chronic kidney disease, unspecified: Secondary | ICD-10-CM | POA: Diagnosis present

## 2017-08-17 DIAGNOSIS — Z955 Presence of coronary angioplasty implant and graft: Secondary | ICD-10-CM | POA: Diagnosis not present

## 2017-08-17 DIAGNOSIS — I252 Old myocardial infarction: Secondary | ICD-10-CM | POA: Diagnosis not present

## 2017-08-17 DIAGNOSIS — I255 Ischemic cardiomyopathy: Secondary | ICD-10-CM | POA: Diagnosis present

## 2017-08-17 DIAGNOSIS — Z95811 Presence of heart assist device: Secondary | ICD-10-CM | POA: Diagnosis not present

## 2017-08-17 DIAGNOSIS — Z8673 Personal history of transient ischemic attack (TIA), and cerebral infarction without residual deficits: Secondary | ICD-10-CM

## 2017-08-17 DIAGNOSIS — Z79899 Other long term (current) drug therapy: Secondary | ICD-10-CM | POA: Diagnosis not present

## 2017-08-17 DIAGNOSIS — E039 Hypothyroidism, unspecified: Secondary | ICD-10-CM | POA: Diagnosis present

## 2017-08-17 DIAGNOSIS — I4719 Other supraventricular tachycardia: Secondary | ICD-10-CM

## 2017-08-17 DIAGNOSIS — I4892 Unspecified atrial flutter: Secondary | ICD-10-CM

## 2017-08-17 DIAGNOSIS — Z7901 Long term (current) use of anticoagulants: Secondary | ICD-10-CM

## 2017-08-17 DIAGNOSIS — Z95810 Presence of automatic (implantable) cardiac defibrillator: Secondary | ICD-10-CM | POA: Diagnosis not present

## 2017-08-17 DIAGNOSIS — I472 Ventricular tachycardia, unspecified: Secondary | ICD-10-CM

## 2017-08-17 DIAGNOSIS — I5023 Acute on chronic systolic (congestive) heart failure: Secondary | ICD-10-CM | POA: Diagnosis present

## 2017-08-17 DIAGNOSIS — D638 Anemia in other chronic diseases classified elsewhere: Secondary | ICD-10-CM | POA: Diagnosis present

## 2017-08-17 DIAGNOSIS — I739 Peripheral vascular disease, unspecified: Secondary | ICD-10-CM | POA: Diagnosis present

## 2017-08-17 DIAGNOSIS — J810 Acute pulmonary edema: Secondary | ICD-10-CM

## 2017-08-17 DIAGNOSIS — G4730 Sleep apnea, unspecified: Secondary | ICD-10-CM | POA: Diagnosis present

## 2017-08-17 DIAGNOSIS — Z87891 Personal history of nicotine dependence: Secondary | ICD-10-CM | POA: Diagnosis not present

## 2017-08-17 DIAGNOSIS — I2510 Atherosclerotic heart disease of native coronary artery without angina pectoris: Secondary | ICD-10-CM | POA: Diagnosis present

## 2017-08-17 DIAGNOSIS — I480 Paroxysmal atrial fibrillation: Secondary | ICD-10-CM | POA: Diagnosis present

## 2017-08-17 DIAGNOSIS — Z7902 Long term (current) use of antithrombotics/antiplatelets: Secondary | ICD-10-CM | POA: Diagnosis not present

## 2017-08-17 DIAGNOSIS — Z23 Encounter for immunization: Secondary | ICD-10-CM | POA: Diagnosis present

## 2017-08-17 DIAGNOSIS — E0590 Thyrotoxicosis, unspecified without thyrotoxic crisis or storm: Secondary | ICD-10-CM | POA: Diagnosis present

## 2017-08-17 DIAGNOSIS — R0602 Shortness of breath: Secondary | ICD-10-CM | POA: Diagnosis not present

## 2017-08-17 DIAGNOSIS — I130 Hypertensive heart and chronic kidney disease with heart failure and stage 1 through stage 4 chronic kidney disease, or unspecified chronic kidney disease: Secondary | ICD-10-CM | POA: Diagnosis not present

## 2017-08-17 DIAGNOSIS — E785 Hyperlipidemia, unspecified: Secondary | ICD-10-CM | POA: Diagnosis present

## 2017-08-17 DIAGNOSIS — I4729 Other ventricular tachycardia: Secondary | ICD-10-CM

## 2017-08-17 DIAGNOSIS — I471 Supraventricular tachycardia: Secondary | ICD-10-CM

## 2017-08-17 LAB — COMPREHENSIVE METABOLIC PANEL
ALBUMIN: 3.7 g/dL (ref 3.5–5.0)
ALK PHOS: 106 U/L (ref 38–126)
ALT: 21 U/L (ref 17–63)
AST: 26 U/L (ref 15–41)
Anion gap: 7 (ref 5–15)
BUN: 29 mg/dL — AB (ref 6–20)
CALCIUM: 8.4 mg/dL — AB (ref 8.9–10.3)
CHLORIDE: 105 mmol/L (ref 101–111)
CO2: 21 mmol/L — AB (ref 22–32)
CREATININE: 1.51 mg/dL — AB (ref 0.61–1.24)
GFR calc non Af Amer: 43 mL/min — ABNORMAL LOW (ref >60)
GFR, EST AFRICAN AMERICAN: 50 mL/min — AB (ref >60)
GLUCOSE: 102 mg/dL — AB (ref 65–99)
Potassium: 4.5 mmol/L (ref 3.5–5.1)
SODIUM: 133 mmol/L — AB (ref 135–145)
Total Bilirubin: 0.9 mg/dL (ref 0.3–1.2)
Total Protein: 6.6 g/dL (ref 6.5–8.1)

## 2017-08-17 LAB — MAGNESIUM: Magnesium: 2.1 mg/dL (ref 1.7–2.4)

## 2017-08-17 LAB — CBC
HCT: 32 % — ABNORMAL LOW (ref 39.0–52.0)
Hemoglobin: 10.3 g/dL — ABNORMAL LOW (ref 13.0–17.0)
MCH: 30.5 pg (ref 26.0–34.0)
MCHC: 32.2 g/dL (ref 30.0–36.0)
MCV: 94.7 fL (ref 78.0–100.0)
PLATELETS: 154 10*3/uL (ref 150–400)
RBC: 3.38 MIL/uL — AB (ref 4.22–5.81)
RDW: 17.9 % — AB (ref 11.5–15.5)
WBC: 5.4 10*3/uL (ref 4.0–10.5)

## 2017-08-17 LAB — PROTIME-INR
INR: 2.76
PROTHROMBIN TIME: 28.9 s — AB (ref 11.4–15.2)

## 2017-08-17 LAB — LACTATE DEHYDROGENASE: LDH: 340 U/L — AB (ref 98–192)

## 2017-08-17 LAB — BRAIN NATRIURETIC PEPTIDE: B NATRIURETIC PEPTIDE 5: 627.8 pg/mL — AB (ref 0.0–100.0)

## 2017-08-17 MED ORDER — FUROSEMIDE 10 MG/ML IJ SOLN
40.0000 mg | Freq: Once | INTRAMUSCULAR | Status: AC
  Administered 2017-08-17: 40 mg via INTRAVENOUS
  Filled 2017-08-17: qty 4

## 2017-08-17 MED ORDER — ACETAMINOPHEN 325 MG PO TABS: 650.0000 mg | ORAL_TABLET | ORAL | Status: DC | PRN

## 2017-08-17 MED ORDER — WARFARIN - PHARMACIST DOSING INPATIENT: Freq: Every day | Status: DC

## 2017-08-17 MED ORDER — AMIODARONE LOAD VIA INFUSION
150.0000 mg | Freq: Once | INTRAVENOUS | Status: AC
  Administered 2017-08-17: 150 mg via INTRAVENOUS
  Filled 2017-08-17: qty 83.34

## 2017-08-17 MED ORDER — LEVOTHYROXINE SODIUM 100 MCG PO TABS
200.0000 ug | ORAL_TABLET | ORAL | Status: DC
  Administered 2017-08-18: 200 ug via ORAL
  Filled 2017-08-17: qty 2

## 2017-08-17 MED ORDER — SODIUM CHLORIDE 0.9 % IV BOLUS (SEPSIS)
500.0000 mL | Freq: Once | INTRAVENOUS | Status: AC
  Administered 2017-08-17: 500 mL via INTRAVENOUS

## 2017-08-17 MED ORDER — RANOLAZINE ER 500 MG PO TB12
500.0000 mg | ORAL_TABLET | Freq: Two times a day (BID) | ORAL | Status: DC
  Administered 2017-08-17 – 2017-08-19 (×4): 500 mg via ORAL
  Filled 2017-08-17 (×4): qty 1

## 2017-08-17 MED ORDER — INFLUENZA VAC SPLIT HIGH-DOSE 0.5 ML IM SUSY
0.5000 mL | PREFILLED_SYRINGE | INTRAMUSCULAR | Status: AC
  Administered 2017-08-19: 0.5 mL via INTRAMUSCULAR
  Filled 2017-08-17: qty 0.5

## 2017-08-17 MED ORDER — ATORVASTATIN CALCIUM 80 MG PO TABS
80.0000 mg | ORAL_TABLET | Freq: Every day | ORAL | Status: DC
  Administered 2017-08-18: 80 mg via ORAL
  Filled 2017-08-17: qty 1

## 2017-08-17 MED ORDER — CLOPIDOGREL BISULFATE 75 MG PO TABS
75.0000 mg | ORAL_TABLET | Freq: Every day | ORAL | Status: DC
  Administered 2017-08-18 – 2017-08-19 (×2): 75 mg via ORAL
  Filled 2017-08-17 (×2): qty 1

## 2017-08-17 MED ORDER — PANTOPRAZOLE SODIUM 40 MG PO TBEC
40.0000 mg | DELAYED_RELEASE_TABLET | Freq: Two times a day (BID) | ORAL | Status: DC
  Administered 2017-08-17 – 2017-08-19 (×4): 40 mg via ORAL
  Filled 2017-08-17 (×4): qty 1

## 2017-08-17 MED ORDER — PROPOFOL 10 MG/ML IV BOLUS
INTRAVENOUS | Status: DC | PRN
  Administered 2017-08-17: 60 mg via INTRAVENOUS

## 2017-08-17 MED ORDER — AMIODARONE HCL IN DEXTROSE 360-4.14 MG/200ML-% IV SOLN
60.0000 mg/h | INTRAVENOUS | Status: AC
  Administered 2017-08-17 – 2017-08-18 (×2): 60 mg/h via INTRAVENOUS
  Filled 2017-08-17 (×2): qty 200

## 2017-08-17 MED ORDER — ONDANSETRON HCL 4 MG/2ML IJ SOLN: 4.0000 mg | Freq: Four times a day (QID) | INTRAMUSCULAR | Status: DC | PRN

## 2017-08-17 MED ORDER — AMIODARONE HCL IN DEXTROSE 360-4.14 MG/200ML-% IV SOLN: 30.0000 mg/h | INTRAVENOUS | Status: DC

## 2017-08-17 MED ORDER — LEVOTHYROXINE SODIUM 100 MCG PO TABS
300.0000 ug | ORAL_TABLET | ORAL | Status: DC
  Administered 2017-08-19: 300 ug via ORAL
  Filled 2017-08-17: qty 3

## 2017-08-17 MED ORDER — METOPROLOL SUCCINATE ER 25 MG PO TB24
25.0000 mg | ORAL_TABLET | Freq: Two times a day (BID) | ORAL | Status: DC
  Administered 2017-08-17 – 2017-08-19 (×4): 25 mg via ORAL
  Filled 2017-08-17 (×4): qty 1

## 2017-08-17 MED ORDER — WARFARIN SODIUM 2 MG PO TABS
2.0000 mg | ORAL_TABLET | Freq: Once | ORAL | Status: AC
  Administered 2017-08-17: 2 mg via ORAL
  Filled 2017-08-17 (×2): qty 1

## 2017-08-17 MED ORDER — PROPOFOL 10 MG/ML IV BOLUS
50.0000 mg | Freq: Once | INTRAVENOUS | Status: AC
  Administered 2017-08-17: 50 mg via INTRAVENOUS
  Filled 2017-08-17: qty 20

## 2017-08-17 MED ORDER — NITROGLYCERIN 0.4 MG SL SUBL: 0.4000 mg | SUBLINGUAL_TABLET | SUBLINGUAL | Status: DC | PRN

## 2017-08-17 MED ORDER — MIDAZOLAM HCL 2 MG/2ML IJ SOLN
2.0000 mg | Freq: Once | INTRAMUSCULAR | Status: AC
  Administered 2017-08-17: 2 mg via INTRAVENOUS

## 2017-08-17 MED ORDER — DOCUSATE SODIUM 100 MG PO CAPS
200.0000 mg | ORAL_CAPSULE | Freq: Two times a day (BID) | ORAL | Status: DC
  Administered 2017-08-17 – 2017-08-19 (×4): 200 mg via ORAL
  Filled 2017-08-17 (×4): qty 2

## 2017-08-17 MED ORDER — MIDAZOLAM HCL 2 MG/2ML IJ SOLN
INTRAMUSCULAR | Status: AC
  Filled 2017-08-17: qty 2

## 2017-08-17 NOTE — Progress Notes (Signed)
ANTICOAGULATION CONSULT NOTE  Pharmacy Consult for warfarin Indication: LVAD   Assessment: 89 yom on warfarin PTA for LVAD, afib/flutter admitted with progressive DOE. Pharmacy consulted to dose inpatient. INR therapeutic on admit at 2.76. Hg 10.3, plt wnl. No bleed documented and no evidence of pump thrombosis per HF note. Noted started on IV amio and plan to switch to increased PO dose when appropriate.   PTA warfarin dose: 2mg  MWF, 4mg  all other days per last anticoagulation clinic note on 10/10 (last dose 10/12 PTA)  Goal of Therapy:  INR 2-3 Monitor platelets by anticoagulation protocol: Yes   Plan:  Warfarin 2mg  PO x 1 dose - empirically lower dose with increased amiodarone and f/u INR trend Daily INR Monitor CBC, s/sx bleeding, DDI  [name], PharmD, BCPS Clinical Pharmacist 08/17/2017 8:49 PM

## 2017-08-17 NOTE — ED Provider Notes (Signed)
[facility] DEPT Provider Note   CSN: [id number] Arrival date & time: 08/17/17  1712  History   Chief Complaint Chief Complaint  Patient presents with  . Shortness of Breath  . Weakness    HPI [name] is a 76 y.o. male.  The patient is a 66yp male with a past medical history significant for LVAD (2014), HFrEF (15-20% 08/21/17) CAD, MI, stroke, HTN, HLD, CKD, implanted ICD/pacemaker, and atrial fibrillation on warfarin, who presents to the ED complaining of shortness of breath and dizziness.  The patient reports dyspnea with exertion since an illness 8 days ago. At that time, he had 1 day of nausea, vomiting, and diarrhea. His infectious symptoms resolved, however he has remained weak with low energy overall since that time. He denies chest pain and fever. He reports a cough that has been present for the past several weeks with no sputum production. He has not tried any new medications or interventions for his symptoms. His shortness of breath improves when sitting up, however he reports dizziness when standing.   The history is provided by the patient, the spouse and medical records. No language interpreter was used.  Shortness of Breath  Associated symptoms include cough and vomiting. Pertinent negatives include no fever and no chest pain.  Weakness  Associated symptoms include shortness of breath and vomiting. Pertinent negatives include no chest pain.    Past Medical History:  Diagnosis Date  . Arthritis   . Automatic implantable cardioverter-defibrillator in situ    a. s/p Medtronic Evera device serial [id number]    . Bradycardia   . CAD (coronary artery disease)    a. s/p MI 1982;  s/p DES to CFX 2011;  s/p NSTEMI 4/12 in setting of VTach;  cath 7/12:  LM ok, LAD mid 30-40%, Dx's with 40%, mCFX stent patent, prox to mid RCA occluded with R to R and L to R collats.  Medical Tx was continued  . Chronic systolic heart failure ([place])    a. s/p LVAD 10/2013 for  destination therapy  . CKD (chronic kidney disease)   . CVD (cerebrovascular disease)    a. s/p L CEA 2008  . Cytopenia   . Dysrhythmia    AFIB  . History of GI bleed    a. on ASA- started on plavix during 10/2015 admission for CVA  . HLD (hyperlipidemia)   . HTN (hypertension)   . Hypothyroidism   . Inappropriate therapy from implantable cardioverter-defibrillator    a. ATP for sinus tach SVT wavelet identified SVT but was no  passive (2014)  . Ischemic cardiomyopathy   . 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 L Ear   . Myocardial infarction ([place])    1992  . PAF (paroxysmal atrial fibrillation) ([place])   . PVD (peripheral vascular disease) ([place])    a. 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 Shortness of breath dyspnea    W/ EXERTION   . Sleep apnea    NO CPAP NOW  HE SENT IT BACK YRS AGO  . Stroke [facility])    a. 10/2015 admission   . Ventricular tachycardia ([place])    a. s/p AICD;   h/o ICD shock;  Amiodarone Rx. S/P ablation in 2012    Patient Active Problem List   Diagnosis Date Noted  . Left ventricular assist device (  LVAD) complication, initial encounter 03/26/2017  . Symptomatic anemia 02/20/2017  . Acute bronchitis 07/19/2016  . Open wound of lumbar region without complication 61/60/7371  . Chronic diastolic heart failure ([place])   . Spinal compression fracture ([place]) 02/03/2016  . Subtherapeutic international normalized ratio (INR) 02/01/2016  . Carotid stenosis, asymptomatic 01/31/2016  . Carotid stenosis, symptomatic w/o infarct 01/31/2016  . Carotid artery calcification 01/11/2016  . Symptomatic stenosis of right carotid artery 01/09/2016  . History of GI bleed   . PAF (paroxysmal atrial fibrillation) ([place])   . CKD (chronic kidney disease)   . Hypothyroidism   . CVA (cerebral infarction)   . Cardiomyopathy, ischemic   . Carotid stenosis   . History of CEA (carotid endarterectomy)     . Chronic anticoagulation   . Left ventricular assist device (LVAD) complication 04/30/9484  . Jerking movements of extremities 07/14/2015  . Fall 06/14/2015  . Cough 06/01/2015  . Nausea & vomiting 02/28/2015  . Near syncope   . Chronic systolic CHF (congestive heart failure) ([place])   . Atrial tachycardia, paroxysmal ([place])   . Back pain at L4-L5 level 02/20/2015  . Compression fracture of L4 lumbar vertebra (HCC) 02/20/2015  . Acute GI bleeding 02/12/2015  . Dyspnea 09/13/2014  . Nephrolithiasis 08/09/2014  . 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 CKD (chronic kidney disease) stage 3, GFR 30-59 ml/min (HCC) 03/26/2014  . RVF (right ventricular failure) ([place]) 11/01/2013  . VT (ventricular tachycardia) ([place]) 10/22/2013  . LVAD (left ventricular assist device) present ([place]) 10/20/2013  . Acute on chronic systolic heart failure ([place]) 10/19/2013  . Atrial flutter ([place]) 10/15/2013  . Syncope 10/12/2013  . Weakness generalized 09/23/2013  . Other malaise and fatigue 09/23/2013  . Palliative care encounter 09/23/2013  . Fever 09/20/2013  . Sepsis(995.91) 09/20/2013  . Atrial tachycardia ([place]) 08/27/2013  . Chronic systolic heart failure ([place]) 08/27/2013  . Sinus bradycardia 05/11/2013  . Inappropriate therapy from implantable cardioverter-defibrillator 04/02/2013  . History of AAA (abdominal aortic aneurysm) repair 11/25/2012  . CAD (coronary artery disease) 04/30/2012  . Occlusion and stenosis of carotid artery without mention of cerebral infarction 03/25/2012  . PVD (peripheral vascular disease) ([place])   . HTN (hypertension)   . HLD (hyperlipidemia)   . Ischemic cardiomyopathy   . Automatic implantable cardioverter-defibrillator in situ 04/09/2011  . VENTRICULAR TACHYCARDIA 09/07/2009  . RENAL INSUFFICIENCY 09/07/2009    Past Surgical History:  Procedure Laterality Date  . BACK SURGERY    . CARDIAC CATHETERIZATION     "several" (05/25/2013)  . CARDIAC DEFIBRILLATOR  PLACEMENT  2003; 2005; 05/25/2013   2014: Medtronic Evera device serial number [id number]  . CARDIAC ELECTROPHYSIOLOGY STUDY AND ABLATION  2012  . CARDIOVERSION N/A 10/15/2013   Procedure: CARDIOVERSION;  Surgeon: Jolaine Artist, MD;  Location: [place];  Service: Cardiovascular;  Laterality: N/A;  . CARDIOVERSION N/A 11/04/2013   Procedure: CARDIOVERSION;  Surgeon: Larey Dresser, MD;  Location: [place];  Service: Cardiovascular;  Laterality: N/A;  . CARDIOVERSION N/A 06/04/2014   Procedure: CARDIOVERSION;  Surgeon: Jolaine Artist, MD;  Location: [facility] ENDOSCOPY;  Service: Cardiovascular;  Laterality: N/A;  . CARDIOVERSION N/A 03/02/2015   Procedure: CARDIOVERSION;  Surgeon: Jolaine Artist, MD;  Location: [facility] ENDOSCOPY;  Service: Cardiovascular;  Laterality: N/A;  . CARDIOVERSION N/A 01/19/2016   Procedure: CARDIOVERSION;  Surgeon: Larey Dresser, MD;  Location: [place];  Service: Cardiovascular;  Laterality: N/A;  . CAROTID ENDARTERECTOMY Left ~ 2007  . CATARACT  EXTRACTION W/ INTRAOCULAR LENS  IMPLANT, BILATERAL Bilateral 2000  . CHOLECYSTECTOMY  12/15/?2010  . COLONOSCOPY WITH PROPOFOL N/A 11/18/2016   Procedure: COLONOSCOPY WITH PROPOFOL;  Surgeon: Ronald Lobo, MD;  Location: [facility];  Service: Endoscopy;  Laterality: N/A;  . CORONARY ANGIOPLASTY  1992  . CORONARY ANGIOPLASTY WITH STENT PLACEMENT     "last one was 11/2012  (05/25/2013)  . CYSTOSCOPY WITH RETROGRADE PYELOGRAM, URETEROSCOPY AND STENT PLACEMENT Left 08/09/2014   Procedure: CYSTOSCOPY WITH RETROGRADE PYELOGRAM, URETEROSCOPY AND STENT PLACEMENT;  Surgeon: Bernestine Amass, MD;  Location: [facility];  Service: Urology;  Laterality: Left;  . CYSTOSCOPY WITH RETROGRADE PYELOGRAM, URETEROSCOPY AND STENT PLACEMENT Left 09/01/2014   Procedure: CYSTOSCOPY WITH URETEROSCOPY AND STENT REMOVAL;  Surgeon: Bernestine Amass, MD;  Location: [facility];  Service: Urology;  Laterality: Left;  . CYSTOSCOPY WITH RETROGRADE PYELOGRAM,  URETEROSCOPY AND STENT PLACEMENT Left 09/20/2014   Procedure: CYSTOSCOPY WITH RETROGRADE PYELOGRAM, URETEROSCOPY AND STENT PLACEMENT, stone removal;  Surgeon: Bernestine Amass, MD;  Location: [facility];  Service: Urology;  Laterality: Left;  . DOPPLER ECHOCARDIOGRAPHY  2011  . ELBOW ARTHROSCOPY Right 1990's  . ELECTROPHYSIOLOGIC STUDY N/A 07/19/2016   Procedure: Cardioversion;  Surgeon: Evans Lance, MD;  Location: [facility] CV LAB;  Service: Cardiovascular;  Laterality: N/A;  . ENDARTERECTOMY Right 01/13/2016   Procedure: RIGHT CAROTID ARTERY ENDARTERECTOMY;  Surgeon: Mal Misty, MD;  Location: [facility];  Service: Vascular;  Laterality: Right;  . ENTEROSCOPY Left 02/23/2017   Procedure: ENTEROSCOPY;  Surgeon: Ronald Lobo, MD;  Location: [facility] ENDOSCOPY;  Service: Endoscopy;  Laterality: Left;  . ESOPHAGOGASTRODUODENOSCOPY N/A 02/15/2015   Procedure: ESOPHAGOGASTRODUODENOSCOPY (EGD);  Surgeon: Carol Ada, MD;  Location: [facility] ENDOSCOPY;  Service: Endoscopy;  Laterality: N/A;  LVAD  . EYE SURGERY     VITRECTOMY  LEFT 05/2012  . FEMORAL-POPLITEAL BYPASS GRAFT Right 2011  . FEMORAL-POPLITEAL BYPASS GRAFT Left 05/2002   Archie Endo 05/06/2002 (05/25/2013)  . HEEL SPUR SURGERY Right 1990's  . HOLMIUM LASER APPLICATION Left 93/26/7124   Procedure: HOLMIUM LASER APPLICATION;  Surgeon: Bernestine Amass, MD;  [facility];  Service: Urology;  Laterality: Left;  . IMPLANTABLE CARDIOVERTER DEFIBRILLATOR GENERATOR CHANGE N/A 05/25/2013   Procedure: IMPLANTABLE CARDIOVERTER DEFIBRILLATOR GENERATOR CHANGE;  Surgeon: Deboraha Sprang, MD;  Location: [facility] CATH LAB;  Service: Cardiovascular;  Laterality: N/A;  . INSERTION OF IMPLANTABLE LEFT VENTRICULAR ASSIST DEVICE N/A 10/19/2013   Procedure: INSERTION OF IMPLANTABLE LEFT VENTRICULAR ASSIST DEVICE;  Surgeon: Gaye Pollack, MD;  Location: [place];  Service: Open Heart Surgery;  Laterality: N/A;  CIRC ARREST  NITRIC OXIDE  MEDTRONIC ICD  . INTRAOPERATIVE TRANSESOPHAGEAL  ECHOCARDIOGRAM N/A 10/19/2013   Procedure: INTRAOPERATIVE TRANSESOPHAGEAL ECHOCARDIOGRAM;  Surgeon: Gaye Pollack, MD;  Location: [facility] OR;  Service: Open Heart Surgery;  Laterality: N/A;  . KNEE ARTHROSCOPY Bilateral 1990's  . LEAD REVISION N/A 06/05/2013   Procedure: LEAD REVISION;  Surgeon: Thompson Grayer, MD;  Location: [facility] CATH LAB;  Service: Cardiovascular;  Laterality: N/A;  . LEFT HEART CATHETERIZATION WITH CORONARY ANGIOGRAM N/A 11/24/2012   Procedure: LEFT HEART CATHETERIZATION WITH CORONARY ANGIOGRAM;  Surgeon: Peter M Martinique, MD;  Location: [facility] CATH LAB;  Service: Cardiovascular;  Laterality: N/A;  . LIPOMA EXCISION Left 2013   "near ear" (05/25/2013)  . [place]; 2000's  . PATCH ANGIOPLASTY Right 01/13/2016   Procedure: WITH  PATCH ANGIOPLASTY;  Surgeon: Mal Misty, MD;  Location: [place];  Service: Vascular;  Laterality: Right;  .  RENAL ARTERY BYPASS Bilateral 2003  . REVASCULARIZATION / IN-SITU GRAFT LEG    . RIGHT HEART CATHETERIZATION N/A 09/17/2013   Procedure: RIGHT HEART CATH;  Surgeon: Jolaine Artist, MD;  Location: [facility] CATH LAB;  Service: Cardiovascular;  Laterality: N/A;  . RIGHT HEART CATHETERIZATION N/A 10/14/2013   Procedure: RIGHT HEART CATH;  Surgeon: Jolaine Artist, MD;  Location: [facility] CATH LAB;  Service: Cardiovascular;  Laterality: N/A;  . RIGHT HEART CATHETERIZATION N/A 11/16/2013   Procedure: RIGHT HEART CATH;  Surgeon: Jolaine Artist, MD;  Location: [facility] CATH LAB;  Service: Cardiovascular;  Laterality: N/A;  . RIGHT HEART CATHETERIZATION N/A 07/13/2014   Procedure: RIGHT HEART CATH;  Surgeon: Jolaine Artist, MD;  Location: [facility] CATH LAB;  Service: Cardiovascular;  Laterality: N/A;  . TEE WITHOUT CARDIOVERSION N/A 11/04/2013   Procedure: TRANSESOPHAGEAL ECHOCARDIOGRAM (TEE);  Surgeon: Larey Dresser, MD;  Location: [place];  Service: Cardiovascular;  Laterality: N/A;  . TEE WITHOUT CARDIOVERSION N/A 03/02/2015   Procedure: TRANSESOPHAGEAL  ECHOCARDIOGRAM (TEE);  Surgeon: Jolaine Artist, MD;  Location: [facility] ENDOSCOPY;  Service: Cardiovascular;  Laterality: N/A;  . TEE WITHOUT CARDIOVERSION N/A 01/19/2016   Procedure: TRANSESOPHAGEAL ECHOCARDIOGRAM (TEE);  Surgeon: Larey Dresser, MD;  Location: [place];  Service: Cardiovascular;  Laterality: N/A;  . TONSILLECTOMY  1946  . TUMOR EXCISION Left 1960's   "fatty tumor" (05/25/2013)  . V-TACH ABLATION N/A 09/12/2011   Procedure: V-TACH ABLATION;  Surgeon: Evans Lance, MD;  Location: [facility] CATH LAB;  Service: Cardiovascular;  Laterality: N/A;  . V-TACH ABLATION N/A 06/08/2013   Procedure: V-TACH ABLATION;  Surgeon: Evans Lance, MD;  Location: [facility] CATH LAB;  Service: Cardiovascular;  Laterality: N/A;       Home Medications    Prior to Admission medications   Medication Sig Start Date End Date Taking? Authorizing Provider  amiodarone (PACERONE) 200 MG tablet Take 1 tablet (200 mg total) by mouth daily. 03/13/17   Bensimhon, Shaune Pascal, MD  atorvastatin (LIPITOR) 80 MG tablet TAKE 1 TABLET DAILY AT 6 PM 07/22/17   Bensimhon, Shaune Pascal, MD  clopidogrel (PLAVIX) 75 MG tablet Take 1 tablet (75 mg total) by mouth daily. 08/27/16   Bensimhon, Shaune Pascal, MD  docusate sodium (COLACE) 100 MG capsule Take 200 mg by mouth daily as needed for mild constipation.     [provider]  furosemide (LASIX) 20 MG tablet Take 1 tablet (20 mg total) by mouth as needed for edema (weight gain). Patient not taking: Reported on 07/23/2017 04/04/17   Bensimhon, Shaune Pascal, MD  levothyroxine (SYNTHROID, LEVOTHROID) 200 MCG tablet Take 200 mcg (1 tab) by mouth daily except take 300 mcg (1 and 1/2 tab) by mouth every Monday.    [provider]  metoprolol succinate (TOPROL-XL) 25 MG 24 hr tablet Take 1 tablet (25 mg total) by mouth 2 (two) times daily. 05/27/17   Larey Dresser, MD  Multiple Vitamins-Minerals (ICAPS PO) Take 1 tablet by mouth 2 (two) times daily.    [provider]    nitroGLYCERIN (NITROSTAT) 0.4 MG SL tablet Place 0.4 mg under the tongue every 5 (five) minutes as needed for chest pain.    [provider]  pantoprazole (PROTONIX) 40 MG tablet Take 1 tablet (40 mg total) by mouth 2 (two) times daily. 04/09/17   Bensimhon, Shaune Pascal, MD  promethazine (PHENERGAN) 12.5 MG tablet Take 12.5 mg by mouth every 6 (six) hours as needed for nausea or vomiting.  [provider]  RANEXA 500 MG 12 hr tablet TAKE 1 TABLET TWICE DAILY 06/04/17   Bensimhon, Shaune Pascal, MD  ranolazine (RANEXA) 500 MG 12 hr tablet TAKE 1 TABLET (500 MG TOTAL) BY MOUTH 2 (TWO) TIMES DAILY. 06/04/17   Bensimhon, Shaune Pascal, MD  warfarin (COUMADIN) 2 MG tablet Take 2-4 mg by mouth daily. Take 4 mg (2 tablets) daily except 2 mg (1 tab) on Mon/Wed/Fri    [provider]  zinc gluconate 50 MG tablet Take 50 mg by mouth 2 (two) times daily.     [provider]    Family History Family History  Problem Relation Age of Onset  . Heart attack Mother   . Coronary artery disease Mother   . Cancer Mother   . Heart disease Mother   . Hyperlipidemia Mother   . Hypertension Mother   . Coronary artery disease Father   . Stroke Father   . Cancer Father   . Heart disease Father        before age 55  . Hyperlipidemia Father   . Hypertension Father   . Heart attack Father   . Coronary artery disease Unknown     Social History Social History  Substance Use Topics  . Smoking status: Former Smoker    Packs/day: 0.50    Years: 37.00    Types: Cigarettes    Quit date: 11/05/2001  . Smokeless tobacco: Never Used  . Alcohol use 0.0 oz/week     Comment: 05/25/2013 "mixed drink couple times/month"   Allergies   Demerol [meperidine]  Review of Systems Review of Systems  Constitutional: Positive for fatigue. Negative for chills and fever.  Respiratory: Positive for cough and shortness of breath.   Cardiovascular: Negative for chest pain and palpitations.   Gastrointestinal: Positive for diarrhea, nausea and vomiting. Negative for abdominal distention.  Musculoskeletal: Negative.   Skin: Negative.   Allergic/Immunologic: Negative for immunocompromised state.  Neurological: Positive for weakness.  Hematological: Negative.   Psychiatric/Behavioral: Negative.    Physical Exam Updated Vital Signs Pulse (!) 101   Temp 98.2 F (36.8 C)   SpO2 97%   Physical Exam  Constitutional: He is oriented to person, place, and time. He appears well-developed and well-nourished. No distress.  HENT:  Head: Normocephalic and atraumatic.  Mouth/Throat: Oropharynx is clear and moist.  Eyes: Conjunctivae and EOM are normal.  Neck: Normal range of motion. Neck supple.  Cardiovascular: Normal rate.   Constant humming to chest without palpable pulses  Pulmonary/Chest: Effort normal. No respiratory distress. He has rales (fine rales in bilateral lung bases). He exhibits no tenderness.  Abdominal: Soft. There is no tenderness. There is no guarding.  LVAD catheter in RLQ, c/d/i  Musculoskeletal: He exhibits edema (pitting, 2+ R, 1+ L). He exhibits no tenderness.  Neurological: He is alert and oriented to person, place, and time.  Skin: Skin is warm and dry.  Psychiatric: He has a normal mood and affect. His behavior is normal. Judgment and thought content normal.  Nursing note and vitals reviewed.   ED Treatments / Results  Labs (all labs ordered are listed, but only abnormal results are displayed) Labs Reviewed - No data to display  EKG  EKG Interpretation  Date/Time:  Saturday August 17 2017 17:21:11 EDT Ventricular Rate:  112 PR Interval:    QRS Duration: 215 QT Interval:  418 QTC Calculation: 571 R Axis:   -67 Text Interpretation:  Sinus tachycardia Prolonged PR interval Nonspecific IVCD with LAD  Abnormal T, consider ischemia, lateral leads Abnormal ekg Confirmed by Carmin Muskrat [phone]) on 08/17/2017 6:24:06 PM      Radiology No  results found.  Procedures .Sedation Date/Time: 08/18/2017 2:18 AM Performed by: Charisse March Authorized by: Carmin Muskrat   Consent:    Consent obtained:  Written   Consent given by:  Patient   Risks discussed:  Nausea, inadequate sedation, prolonged hypoxia resulting in organ damage and vomiting Universal protocol:    Procedure explained and questions answered to patient or proxy's satisfaction: yes     Imaging studies available: yes     Immediately prior to procedure a time out was called: yes     Patient identity confirmation method:  Arm band and verbally with patient Indications:    Procedure performed:  Cardioversion   Procedure necessitating sedation performed by:  Different physician   Intended level of sedation:  Moderate (conscious sedation) Pre-sedation assessment:    Time since last food or drink:  12pm today   ASA classification: class 4 - patient with severe systemic disease that is a constant threat to life     Mallampati score:  III - soft palate, base of uvula visible   Pre-sedation assessments completed and reviewed: airway patency, cardiovascular function, hydration status, mental status, nausea/vomiting, pain level, respiratory function and temperature   Immediate pre-procedure details:    Reviewed: vital signs and NPO status     Verified: bag valve mask available, emergency equipment available, intubation equipment available, IV patency confirmed, oxygen available, reversal medications available and suction available   Procedure details (see MAR for exact dosages):    Preoxygenation:  Nasal cannula   Sedation:  Propofol   Intra-procedure monitoring:  Blood pressure monitoring   Intra-procedure events comment:  Apnea   Intra-procedure management:  BVM ventilation   Total Provider sedation time (minutes):  10 Post-procedure details:    Attendance: Constant attendance by certified staff until patient recovered     Recovery: Patient returned to  pre-procedure baseline     Post-sedation assessments completed and reviewed: mental status     Patient is stable for discharge or admission: yes     Patient tolerance:  Tolerated well, no immediate complications   (including critical care time)  Medications Ordered in ED Medications - No data to display  Initial Impression / Assessment and Plan / ED Course  I have reviewed the triage vital signs and the nursing notes.  Pertinent labs & imaging results that were available during my care of the patient were reviewed by me and considered in my medical decision making (see chart for details).    Initial differential diagnosis included pneumonia, heart failure, VAD pump failure, URI, viral syndrome, gastroenteritis, and bronchitis.  Pertinent labs included CBC without leukocytosis; normocytic anemia noted.  BMP with mild hypokalemia and decreased bicarb.  Serum creatinine elevated to 1.51 (c/w baseline).  BNP elevated to greater than 600.  Normal magnesium.  LDH elevated to 340.  EKG notable for a wide-complex tachycardia with LAD.  Imaging studies included a chest x-ray notable for bilateral interstitial edema and stable cardiomegaly.  The patient was given 40 mg IV Lasix once at the direction of the cardiologist.  Dr. Haroldine Laws spent an extended period of time at the patient's bedside. He was unable to pace him out of ventricular tachycardia, and subsequently a synchronized cardioversion was performed using conscious sedation as detailed above.  The patient tolerated the procedure well with a brief period of apnea, however this was quickly  reversed with BVM and jaw thrust.  The patient was admitted to the Cardiology service for further evaluation and treatment.  The patient was in stable condition at the time of admission.  Final Clinical Impressions(s) / ED Diagnoses   Final diagnoses:  Acute pulmonary edema ([place])  LVAD (left ventricular assist device) present ([place])  VT (ventricular  tachycardia) [facility])   New Prescriptions New Prescriptions   No medications on file     Charisse March, MD 08/18/17 0231    Carmin Muskrat, MD 08/22/17 [phone]

## 2017-08-17 NOTE — ED Notes (Signed)
Contacted VAD coordinator, ordered LDH, CBC, and CMP for [place] MD.

## 2017-08-17 NOTE — CV Procedure (Signed)
\.       DIRECT CURRENT CARDIOVERSION  NAME:  [name]   MRN: [id number] DOB:  30-Jul-1941   ADMIT DATE: 08/17/2017   INDICATIONS: Ventricular tachycardia   PROCEDURE:    After multiple unsuccsessful attempts to rapidly pace him out of VT through his device. Informed consent was obtained prior to the procedure. The risks, benefits and alternatives for the procedure were discussed and the patient comprehended these risks. Once an appropriate time out was taken, the patient had the defibrillator pads placed in the anterior and posterior position. The patient then underwent sedation by Dr. Vanita Panda with propofol 60mg . Once an appropriate level of sedation was achieved, the patient received a single biphasic, synchronized 200J shock with prompt conversion to sinus rhythm. No apparent complications. I personally monitored his MAP using Doppler throughout the procedure and 20 mins afterward  Glori Bickers, MD  9:47 PM

## 2017-08-17 NOTE — H&P (Addendum)
Advanced Heart Failure VAD History and Physical Note   Reason for Admission: Acute heart failure  HPI:     [name] is a 76 yo male with h/o VT, PAD and severe systolic HF (EF 91-47%) due to mixed ischemic/nonischemic CM he is s/p HM II LVAD implant for destination therapy on October 19, 2013.VAD implantation was complicated by VT, AF/Aflutter and RHF. Required short term milrinone.   GU Event 09/01/14 had impacted ureteral stone and underwent cystoscopy and flexible ureteroscopy with lithotripsy and basket extraction.   09/2015 Hematuria and chronic nephrolithiasis. Renal US showed mild hyronephrosis and bilateral renal cysts. Dr. Risa Grill took back for repeat lithotripsy and stone extraction by ureteroscopy and J stent placement.  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  02/2017 - Episode of melena. Hgb 10.9-> 8.7. 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  01/2016 R CEA. He was started on Plavix.  01/2016 Lumbar fusion L3-L5 with pedicle screws posterolateral arthrodesis with BMP, allograft  VAD Pump Event:  03/2017 - Pump stop. Driveline repair 82/9562 Speed turned down to 8800 due to RV failure. [name]    Has felt weak for the past week or so with increasing dyspnea on exertion and orthopnea. On Thursday had multiple bouts of n/v/diarrhea. Took extra fluids. Today with progressive DOE to the point where he was SOB just walking across the room. No fevers, chills, dark urine. Recent INRs have been ok. No bleeding. Weight up 5 pounds.   In ER. HGB ok. CXR + edema. ECG with probable slow VT with VA conduction (vs recurrent atrial tach) at 115. LDH 340 (baseline 330-350)   LVAD INTERROGATION:  HeartMate II LVAD:  Flow 2.8 liters/min, speed 8800, power 4.0, PI 5.6 Multiple low flow alarms on 10/10. Resolved     Review of Systems: [y] = yes, [ ]  = no   General: Weight gain [ y]; Weight loss  [ ] ; Anorexia [ ] ; Fatigue Blue.Reese ]; Fever [ ] ; Chills [ ] ; Weakness [ y]  Cardiac: Chest pain/pressure [ ] ; Resting SOB Blue.Reese ]; Exertional SOB Blue.Reese ]; Orthopnea Blue.Reese ]; Pedal Edema [ y]; Palpitations [ ] ; Syncope [ ] ; Presyncope [ ] ; Paroxysmal nocturnal dyspnea[ ]   Pulmonary: Cough [ ] ; Wheezing[ ] ; Hemoptysis[ ] ; Sputum [ ] ; Snoring [ ]   GI: Vomiting[ ] ; Dysphagia[ ] ; Melena[ ] ; Hematochezia [ ] ; Heartburn[ ] ; Abdominal pain [ ] ; Constipation [ ] ; Diarrhea [ ] ; BRBPR [ ]   GU: Hematuria[ ] ; Dysuria [ ] ; Nocturia[ ]   Vascular: Pain in legs with walking [ ] ; Pain in feet with lying flat [ ] ; Non-healing sores [ ] ; Stroke [ ] ; TIA [ ] ; Slurred speech [ ] ;  Neuro: Headaches[ ] ; Vertigo[ ] ; Seizures[ ] ; Paresthesias[ ] ;Blurred vision [ ] ; Diplopia [ ] ; Vision changes [ ]   Ortho/Skin: Arthritis Blue.Reese ]; Joint pain [ y]; Muscle pain [ ] ; Joint swelling [ ] ; Back Pain [ ] ; Rash [ ]   Psych: Depression[ ] ; Anxiety[ ]   Heme: Bleeding problems [ ] ; Clotting disorders [ ] ; Anemia [ ]   Endocrine: Diabetes [ ] ; Thyroid dysfunction[ ]     Home Medications Prior to Admission medications   Medication Sig Start Date End Date Taking? Authorizing Provider  amiodarone (PACERONE) 200 MG tablet Take 1 tablet (200 mg total) by mouth daily. 03/13/17   Mande Auvil, Shaune Pascal, MD  atorvastatin (LIPITOR) 80 MG tablet TAKE 1 TABLET DAILY AT  6 PM 07/22/17   Suhaib Guzzo, Shaune Pascal, MD  clopidogrel (PLAVIX) 75 MG tablet Take 1 tablet (75 mg total) by mouth daily. 08/27/16   Marquavius Scaife, Shaune Pascal, MD  docusate sodium (COLACE) 100 MG capsule Take 200 mg by mouth daily as needed for mild constipation.     [provider]  furosemide (LASIX) 20 MG tablet Take 1 tablet (20 mg total) by mouth as needed for edema (weight gain). Patient not taking: Reported on 07/23/2017 04/04/17   Amarra Sawyer, Shaune Pascal, MD  levothyroxine (SYNTHROID, LEVOTHROID) 200 MCG tablet Take 200 mcg (1 tab) by mouth daily except take 300 mcg (1 and 1/2 tab) by mouth every  Monday.    [provider]  metoprolol succinate (TOPROL-XL) 25 MG 24 hr tablet Take 1 tablet (25 mg total) by mouth 2 (two) times daily. 05/27/17   Larey Dresser, MD  Multiple Vitamins-Minerals (ICAPS PO) Take 1 tablet by mouth 2 (two) times daily.    [provider]  nitroGLYCERIN (NITROSTAT) 0.4 MG SL tablet Place 0.4 mg under the tongue every 5 (five) minutes as needed for chest pain.    [provider]  pantoprazole (PROTONIX) 40 MG tablet Take 1 tablet (40 mg total) by mouth 2 (two) times daily. 04/09/17   Estrellita Lasky, Shaune Pascal, MD  promethazine (PHENERGAN) 12.5 MG tablet Take 12.5 mg by mouth every 6 (six) hours as needed for nausea or vomiting.    [provider]  RANEXA 500 MG 12 hr tablet TAKE 1 TABLET TWICE DAILY 06/04/17   Calandra Madura, Shaune Pascal, MD  ranolazine (RANEXA) 500 MG 12 hr tablet TAKE 1 TABLET (500 MG TOTAL) BY MOUTH 2 (TWO) TIMES DAILY. 06/04/17   Wynn Alldredge, Shaune Pascal, MD  warfarin (COUMADIN) 2 MG tablet Take 2-4 mg by mouth daily. Take 4 mg (2 tablets) daily except 2 mg (1 tab) on Mon/Wed/Fri    [provider]  zinc gluconate 50 MG tablet Take 50 mg by mouth 2 (two) times daily.     [provider]    Past Medical History: Past Medical History:  Diagnosis Date  . Arthritis   . Automatic implantable cardioverter-defibrillator in situ    a. s/p Medtronic Evera device serial [id number]    . Bradycardia   . CAD (coronary artery disease)    a. s/p MI 1982;  s/p DES to CFX 2011;  s/p NSTEMI 4/12 in setting of VTach;  cath 7/12:  LM ok, LAD mid 30-40%, Dx's with 40%, mCFX stent patent, prox to mid RCA occluded with R to R and L to R collats.  Medical Tx was continued  . Chronic systolic heart failure ([place])    a. s/p LVAD 10/2013 for destination therapy  . CKD (chronic kidney disease)   . CVD (cerebrovascular disease)    a. s/p L CEA 2008  . Cytopenia   . Dysrhythmia    AFIB  . History of GI bleed    a. on ASA- started on  plavix during 10/2015 admission for CVA  . HLD (hyperlipidemia)   . HTN (hypertension)   . Hypothyroidism   . Inappropriate therapy from implantable cardioverter-defibrillator    a. ATP for sinus tach SVT wavelet identified SVT but was no  passive (2014)  . Ischemic cardiomyopathy   . 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 L Ear   . Myocardial infarction ([place])    1992  . PAF (paroxysmal atrial fibrillation) ([place])   . PVD (peripheral vascular disease) ([place])  a. 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 Shortness of breath dyspnea    W/ EXERTION   . Sleep apnea    NO CPAP NOW  HE SENT IT BACK YRS AGO  . Stroke [facility])    a. 10/2015 admission   . Ventricular tachycardia ([place])    a. s/p AICD;   h/o ICD shock;  Amiodarone Rx. S/P ablation in 2012    Past Surgical History: Past Surgical History:  Procedure Laterality Date  . BACK SURGERY    . CARDIAC CATHETERIZATION     "several" (05/25/2013)  . CARDIAC DEFIBRILLATOR PLACEMENT  2003; 2005; 05/25/2013   2014: Medtronic Evera device serial number [id number]  . CARDIAC ELECTROPHYSIOLOGY STUDY AND ABLATION  2012  . CARDIOVERSION N/A 10/15/2013   Procedure: CARDIOVERSION;  Surgeon: Jolaine Artist, MD;  Location: [place];  Service: Cardiovascular;  Laterality: N/A;  . CARDIOVERSION N/A 11/04/2013   Procedure: CARDIOVERSION;  Surgeon: Larey Dresser, MD;  Location: [place];  Service: Cardiovascular;  Laterality: N/A;  . CARDIOVERSION N/A 06/04/2014   Procedure: CARDIOVERSION;  Surgeon: Jolaine Artist, MD;  Location[facility] ENDOSCOPY;  Service: Cardiovascular;  Laterality: N/A;  . CARDIOVERSION N/A 03/02/2015   Procedure: CARDIOVERSION;  Surgeon: Jolaine Artist, MD;  Location: [facility] ENDOSCOPY;  Service: Cardiovascular;  Laterality: N/A;  . CARDIOVERSION N/A 01/19/2016   Procedure: CARDIOVERSION;  Surgeon: Larey Dresser, MD;  Location: [place];   Service: Cardiovascular;  Laterality: N/A;  . CAROTID ENDARTERECTOMY Left ~ 2007  . CATARACT EXTRACTION W/ INTRAOCULAR LENS  IMPLANT, BILATERAL Bilateral 2000  . CHOLECYSTECTOMY  12/15/?2010  . COLONOSCOPY WITH PROPOFOL N/A 09-28-2017   Procedure: COLONOSCOPY WITH PROPOFOL;  Surgeon: Ronald Lobo, MD;  Location: [place];  Service: Endoscopy;  Laterality: N/A;  . CORONARY ANGIOPLASTY  1992  . CORONARY ANGIOPLASTY WITH STENT PLACEMENT     "last one was 11/2012  (05/25/2013)  . CYSTOSCOPY WITH RETROGRADE PYELOGRAM, URETEROSCOPY AND STENT PLACEMENT Left 08/09/2014   Procedure: CYSTOSCOPY WITH RETROGRADE PYELOGRAM, URETEROSCOPY AND STENT PLACEMENT;  Surgeon: Bernestine Amass, MD;  Location: [facility];  Service: Urology;  Laterality: Left;  . CYSTOSCOPY WITH RETROGRADE PYELOGRAM, URETEROSCOPY AND STENT PLACEMENT Left 09/01/2014   Procedure: CYSTOSCOPY WITH URETEROSCOPY AND STENT REMOVAL;  Surgeon: Bernestine Amass, MD;  Location: [facility];  Service: Urology;  Laterality: Left;  . CYSTOSCOPY WITH RETROGRADE PYELOGRAM, URETEROSCOPY AND STENT PLACEMENT Left 09/20/2014   Procedure: CYSTOSCOPY WITH RETROGRADE PYELOGRAM, URETEROSCOPY AND STENT PLACEMENT, stone removal;  Surgeon: Bernestine Amass, MD;  Location: [facility];  Service: Urology;  Laterality: Left;  . DOPPLER ECHOCARDIOGRAPHY  2011  . ELBOW ARTHROSCOPY Right 1990's  . ELECTROPHYSIOLOGIC STUDY N/A 07/19/2016   Procedure: Cardioversion;  Surgeon: Evans Lance, MD;  Location: [place] CV LAB;  Service: Cardiovascular;  Laterality: N/A;  . ENDARTERECTOMY Right 01/13/2016   Procedure: RIGHT CAROTID ARTERY ENDARTERECTOMY;  Surgeon: Mal Misty, MD;  Location: [place];  Service: Vascular;  Laterality: Right;  . ENTEROSCOPY Left 02/23/2017   Procedure: ENTEROSCOPY;  Surgeon: Ronald Lobo, MD;  Location: [facility] ENDOSCOPY;  Service: Endoscopy;  Laterality: Left;  . ESOPHAGOGASTRODUODENOSCOPY N/A 02/15/2015   Procedure: ESOPHAGOGASTRODUODENOSCOPY (EGD);  Surgeon:  Carol Ada, MD;  Location: [facility] ENDOSCOPY;  Service: Endoscopy;  Laterality: N/A;  LVAD  . EYE SURGERY     VITRECTOMY  LEFT  05/2012  . FEMORAL-POPLITEAL BYPASS GRAFT Right 2011  . FEMORAL-POPLITEAL BYPASS GRAFT Left 05/2002   Archie Endo 05/06/2002 (05/25/2013)  . HEEL SPUR SURGERY Right 1990's  . HOLMIUM LASER APPLICATION Left 29/79/8921   Procedure: HOLMIUM LASER APPLICATION;  Surgeon: Bernestine Amass, MD;  [facility];  Service: Urology;  Laterality: Left;  . IMPLANTABLE CARDIOVERTER DEFIBRILLATOR GENERATOR CHANGE N/A 05/25/2013   Procedure: IMPLANTABLE CARDIOVERTER DEFIBRILLATOR GENERATOR CHANGE;  Surgeon: Deboraha Sprang, MD;  Location: [facility] CATH LAB;  Service: Cardiovascular;  Laterality: N/A;  . INSERTION OF IMPLANTABLE LEFT VENTRICULAR ASSIST DEVICE N/A 10/19/2013   Procedure: INSERTION OF IMPLANTABLE LEFT VENTRICULAR ASSIST DEVICE;  Surgeon: Gaye Pollack, MD;  Location: [place];  Service: Open Heart Surgery;  Laterality: N/A;  CIRC ARREST  NITRIC OXIDE  MEDTRONIC ICD  . INTRAOPERATIVE TRANSESOPHAGEAL ECHOCARDIOGRAM N/A 10/19/2013   Procedure: INTRAOPERATIVE TRANSESOPHAGEAL ECHOCARDIOGRAM;  Surgeon: Gaye Pollack, MD;  Location[facility] OR;  Service: Open Heart Surgery;  Laterality: N/A;  . KNEE ARTHROSCOPY Bilateral 1990's  . LEAD REVISION N/A 06/05/2013   Procedure: LEAD REVISION;  Surgeon: Thompson Grayer, MD;  Location: [facility] CATH LAB;  Service: Cardiovascular;  Laterality: N/A;  . LEFT HEART CATHETERIZATION WITH CORONARY ANGIOGRAM N/A 11/24/2012   Procedure: LEFT HEART CATHETERIZATION WITH CORONARY ANGIOGRAM;  Surgeon: Peter M Martinique, MD;  Location: [facility] CATH LAB;  Service: Cardiovascular;  Laterality: N/A;  . LIPOMA EXCISION Left 2013   "near ear" (05/25/2013)  . [place]; 2000's  . PATCH ANGIOPLASTY Right 01/13/2016   Procedure: WITH  PATCH ANGIOPLASTY;  Surgeon: Mal Misty, MD;  Location: [place];  Service: Vascular;  Laterality: Right;  . RENAL ARTERY BYPASS Bilateral 2003  .  REVASCULARIZATION / IN-SITU GRAFT LEG    . RIGHT HEART CATHETERIZATION N/A 09/17/2013   Procedure: RIGHT HEART CATH;  Surgeon: Jolaine Artist, MD;  Location: [facility] CATH LAB;  Service: Cardiovascular;  Laterality: N/A;  . RIGHT HEART CATHETERIZATION N/A 10/14/2013   Procedure: RIGHT HEART CATH;  Surgeon: Jolaine Artist, MD;  Location: [facility] CATH LAB;  Service: Cardiovascular;  Laterality: N/A;  . RIGHT HEART CATHETERIZATION N/A 11/16/2013   Procedure: RIGHT HEART CATH;  Surgeon: Jolaine Artist, MD;  Location: [facility] CATH LAB;  Service: Cardiovascular;  Laterality: N/A;  . RIGHT HEART CATHETERIZATION N/A 07/13/2014   Procedure: RIGHT HEART CATH;  Surgeon: Jolaine Artist, MD;  Location: [facility] CATH LAB;  Service: Cardiovascular;  Laterality: N/A;  . TEE WITHOUT CARDIOVERSION N/A 11/04/2013   Procedure: TRANSESOPHAGEAL ECHOCARDIOGRAM (TEE);  Surgeon: Larey Dresser, MD;  Location: [place];  Service: Cardiovascular;  Laterality: N/A;  . TEE WITHOUT CARDIOVERSION N/A 03/02/2015   Procedure: TRANSESOPHAGEAL ECHOCARDIOGRAM (TEE);  Surgeon: Jolaine Artist, MD;  Location: [facility] ENDOSCOPY;  Service: Cardiovascular;  Laterality: N/A;  . TEE WITHOUT CARDIOVERSION N/A 01/19/2016   Procedure: TRANSESOPHAGEAL ECHOCARDIOGRAM (TEE);  Surgeon: Larey Dresser, MD;  Location: [place];  Service: Cardiovascular;  Laterality: N/A;  . TONSILLECTOMY  1946  . TUMOR EXCISION Left 1960's   "fatty tumor" (05/25/2013)  . V-TACH ABLATION N/A 09/12/2011   Procedure: V-TACH ABLATION;  Surgeon: Evans Lance, MD;  Location: [facility] CATH LAB;  Service: Cardiovascular;  Laterality: N/A;  . V-TACH ABLATION N/A 06/08/2013   Procedure: V-TACH ABLATION;  Surgeon: Evans Lance, MD;  Location: [facility] CATH LAB;  Service: Cardiovascular;  Laterality: N/A;    Family History: Family History  Problem Relation Age of Onset  . Heart attack Mother   .  Coronary artery disease Mother   . Cancer Mother   . Heart disease Mother   .  Hyperlipidemia Mother   . Hypertension Mother   . Coronary artery disease Father   . Stroke Father   . Cancer Father   . Heart disease Father        before age 54  . Hyperlipidemia Father   . Hypertension Father   . Heart attack Father   . Coronary artery disease Unknown     Social History: Social History   Social History  . Marital status: Divorced    Spouse name: N/A  . Number of children: 2  . Years of education: N/A   Occupational History  . retired     [employer]  . 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 Smoking status: Former Smoker    Packs/day: 0.50    Years: 37.00    Types: Cigarettes    Quit date: 11/05/2001  . Smokeless tobacco: Never Used  . Alcohol use 0.0 oz/week     Comment: 05/25/2013 "mixed drink couple times/month"  . Drug use: No  . Sexual activity: No   Other Topics Concern  . None   Social History Narrative   Lives with sig other.    Allergies:  Allergies  Allergen Reactions  . Demerol [Meperidine] Other (See Comments)    Paralysis. Could only move eyes. Paralysis. Could only move eyes.    Objective:    Vital Signs:   Temp:  [98.2 F (36.8 C)] 98.2 F (36.8 C) (10/13 1745) Pulse Rate:  [101-113] 113 (10/13 1831) Resp:  [15-19] 19 (10/13 1831) BP: (99-106)/(74-87) 106/74 (10/13 1831) SpO2:  [96 %-97 %] 96 % (10/13 1831)   There were no vitals filed for this visit.  Mean arterial Pressure 90  Physical Exam    General:  Well appearing. No resp difficulty HEENT: Normal Neck: supple. JVPto jaw . Carotids 2+ bilat; no bruits. No lymphadenopathy or thyromegaly appreciated. Cor: Mechanical heart sounds with LVAD hum present. Lungs: Crackles Abdomen: soft, nontender, nondistended. No hepatosplenomegaly. No bruits or masses. Good bowel sounds. Driveline: C/D/I; securement device intact and driveline incorporated Extremities: no cyanosis, clubbing, rash, 1-2+  edema Neuro: alert & orientedx3, cranial nerves grossly intact. moves all 4 extremities w/o difficulty. Affect pleasant   Telemetry   Atrial tach 115 Personally reviewed   EKG   Atrial tach 115 Personally reviewed   Labs    Basic Metabolic Panel:  Recent Labs Lab 08/17/17 1811  NA 133*  K 4.5  CL 105  CO2 21*  GLUCOSE 102*  BUN 29*  CREATININE 1.51*  CALCIUM 8.4*  MG 2.1    Liver Function Tests:  Recent Labs Lab 08/17/17 1811  AST 26  ALT 21  ALKPHOS 106  BILITOT 0.9  PROT 6.6  ALBUMIN 3.7   No results for input(s): LIPASE, AMYLASE in the last 168 hours. No results for input(s): AMMONIA in the last 168 hours.  CBC:  Recent Labs Lab 08/17/17 1811  WBC 5.4  HGB 10.3*  HCT 32.0*  MCV 94.7  PLT 154    Cardiac Enzymes: No results for input(s): CKTOTAL, CKMB, CKMBINDEX, TROPONINI in the last 168 hours.  BNP: BNP (last 3 results)  Recent Labs  08/17/17 1900  BNP 627.8*    ProBNP (last 3 results) No results for input(s): PROBNP in the last 8760  hours.   CBG: No results for input(s): GLUCAP in the last 168 hours.  Coagulation Studies:  Recent Labs  08/17/17 1811  LABPROT 28.9*  INR 2.76    Other results:    Imaging    Dg Chest Portable 1 View  Result Date: 08/17/2017 CLINICAL DATA:  76 year old male with shortness of breath today. LVAD. EXAM: PORTABLE CHEST 1 VIEW COMPARISON:  03/26/2017 and earlier. FINDINGS: Portable AP semi upright view at 1858 hours. LVAD configuration appears stable. Stable cardiomegaly and mediastinal contours. Stable left chest cardiac AICD. Increased interstitial opacity perhaps due to pulmonary vascularity. No pneumothorax or pleural effusion. No confluent opacity. Negative visible bowel gas pattern. IMPRESSION: 1. Increased bilateral pulmonary interstitial opacity. Consider mild interstitial edema and viral/atypical respiratory infection. 2. No pleural fluid identified. Stable cardiomegaly. LVAD  configuration appears stable. Electronically Signed   By: Genevie Ann M.D.   On: 08/17/2017 19:24      Patient Profile:   [name] is a 76 yo male with h/o VT, PAD and severe systolic HF (EF 96-78%) due to mixed ischemic/nonischemic CM he is s/p HM II LVAD implant for destination therapy on October 19, 2013.VAD admitted with recurrent HF in setting of recurrent slow VT  Assessment/Plan:    1. Acute on chronic systolic HF with pulmonary edema - s/p HM-II VAD  - he is volume overloaded in the setting of recurrent slow VT (versus atrial tach) - will diurese with one dose IV lasix in ER (he is volume sensitive). Can repeat in am as needed - INRs have all been therapeutic for weeks - Will interrogate device and try to pace out tonight. If unable to pace out with DC-CV - Increase amio 440 bid  2. LVAD - No evidence of pump thrombosis - Parameters currently stable. (Low flows recently in setting of n/v/d) no resolved.  3. Recurrent slow VT - Not tolerating well. - As above, will try to pace out. If not effective will do DC-CV - INRs therapeutic. PharmD to help dose  - Will treat with IV amio overnight then can switch to po and increase amio to 400 bid.   4. H/o amiodarone induced-thyrotoxicosis - Watch closely with increased amio dosing  5. Anemia of chronic disease - stable  6. PAF - continue amio and warfarin  I reviewed the LVAD parameters from today, and compared the results to the patient's prior recorded data.  No programming changes were made.  The LVAD is functioning within specified parameters.  The patient performs LVAD self-test daily.  LVAD interrogation was negative for any significant power changes, alarms or PI events/speed drops.  LVAD equipment check completed and is in good working order.  Back-up equipment present.   LVAD education done on emergency procedures and precautions and reviewed exit site care.  Length of Stay: 0  Glori Bickers, MD 08/17/2017,  8:28 PM  VAD Team Pager [phone] (7am - 7am) +++VAD ISSUES ONLY+++  Advanced Heart Failure Team Pager [phone] (M-F; 7a - 4p)  Please contact [facility] for night-coverage after hours (4p -7a ) and weekends on [email or web address] for all non- LVAD Issues  Addendum  PPM interrogation shows AV dissociation with Ventricular rates ~115 and sinus rate below that suggestive of slow VT.VT rate detection set at 162 so unable to assess duration.   Glori Bickers, MD  8:37 PM

## 2017-08-18 LAB — BASIC METABOLIC PANEL
Anion gap: 7 (ref 5–15)
BUN: 26 mg/dL — AB (ref 6–20)
CALCIUM: 8.5 mg/dL — AB (ref 8.9–10.3)
CHLORIDE: 105 mmol/L (ref 101–111)
CO2: 25 mmol/L (ref 22–32)
CREATININE: 1.43 mg/dL — AB (ref 0.61–1.24)
GFR calc non Af Amer: 46 mL/min — ABNORMAL LOW (ref >60)
GFR, EST AFRICAN AMERICAN: 53 mL/min — AB (ref >60)
GLUCOSE: 128 mg/dL — AB (ref 65–99)
Potassium: 3.9 mmol/L (ref 3.5–5.1)
Sodium: 137 mmol/L (ref 135–145)

## 2017-08-18 LAB — CBC
HEMATOCRIT: 32.4 % — AB (ref 39.0–52.0)
Hemoglobin: 10.3 g/dL — ABNORMAL LOW (ref 13.0–17.0)
MCH: 30 pg (ref 26.0–34.0)
MCHC: 31.8 g/dL (ref 30.0–36.0)
MCV: 94.5 fL (ref 78.0–100.0)
Platelets: 158 10*3/uL (ref 150–400)
RBC: 3.43 MIL/uL — ABNORMAL LOW (ref 4.22–5.81)
RDW: 17.4 % — AB (ref 11.5–15.5)
WBC: 6 10*3/uL (ref 4.0–10.5)

## 2017-08-18 LAB — PROTIME-INR
INR: 2.76
INR: 3.02
PROTHROMBIN TIME: 31 s — AB (ref 11.4–15.2)
PROTHROMBIN TIME: 29 s — AB (ref 11.4–15.2)

## 2017-08-18 LAB — LACTATE DEHYDROGENASE
LDH: 353 U/L — ABNORMAL HIGH (ref 98–192)
LDH: 298 U/L — AB (ref 98–192)

## 2017-08-18 LAB — MRSA PCR SCREENING: MRSA by PCR: NEGATIVE

## 2017-08-18 LAB — MAGNESIUM: Magnesium: 2.2 mg/dL (ref 1.7–2.4)

## 2017-08-18 MED ORDER — AMIODARONE HCL 200 MG PO TABS
400.0000 mg | ORAL_TABLET | Freq: Two times a day (BID) | ORAL | Status: DC
Start: 2017-08-18 — End: 2017-08-19
  Administered 2017-08-18 – 2017-08-19 (×3): 400 mg via ORAL
  Filled 2017-08-18 (×3): qty 2

## 2017-08-18 MED ORDER — FUROSEMIDE 10 MG/ML IJ SOLN
40.0000 mg | Freq: Once | INTRAMUSCULAR | Status: AC
  Administered 2017-08-18: 40 mg via INTRAVENOUS
  Filled 2017-08-18: qty 4

## 2017-08-18 MED ORDER — POTASSIUM CHLORIDE CRYS ER 20 MEQ PO TBCR
40.0000 meq | EXTENDED_RELEASE_TABLET | Freq: Once | ORAL | Status: AC
  Administered 2017-08-18: 40 meq via ORAL
  Filled 2017-08-18: qty 2

## 2017-08-18 NOTE — Progress Notes (Signed)
ANTICOAGULATION CONSULT NOTE - Follow Up Consult  Pharmacy Consult for Coumadin Indication: LVAD  Allergies  Allergen Reactions  . Demerol [Meperidine] Other (See Comments)    Paralysis. Could only move eyes.     Patient Measurements: Height: 6' (182.9 cm) Weight: 200 lb (90.7 kg) IBW/kg (Calculated) : 77.6  Vital Signs: Temp: 97.8 F (36.6 C) (10/14 0801) Temp Source: Oral (10/14 0801) Pulse Rate: 59 (10/14 0400)  Labs:  Recent Labs  08/17/17 1811 08/17/17 2348 08/18/17 0301  HGB 10.3*  --  10.3*  HCT 32.0*  --  32.4*  PLT 154  --  158  LABPROT 28.9* 29.0* 31.0*  INR 2.76 2.76 3.02  CREATININE 1.51*  --  1.43*    Estimated Creatinine Clearance: 47.5 mL/min (A) (by C-G formula based on SCr of 1.43 mg/dL (H)).   Medications:  Scheduled:  . amiodarone  400 mg Oral BID  . atorvastatin  80 mg Oral q1800  . clopidogrel  75 mg Oral Daily  . docusate sodium  200 mg Oral BID  . Influenza vac split quadrivalent PF  0.5 mL Intramuscular Tomorrow-1000  . levothyroxine  200 mcg Oral Once per day on Sun Tue Wed Thu Fri Sat  . [START ON 08/19/2017] levothyroxine  300 mcg Oral Once per day on Mon  . metoprolol succinate  25 mg Oral BID  . pantoprazole  40 mg Oral BID  . ranolazine  500 mg Oral BID  . Warfarin - Pharmacist Dosing Inpatient   Does not apply q1800    Assessment: 76 yo male on warfarin PTA for LAVD, afib/flutter admitted with DOE.  Found in slow VT.  Pharmacy asked to dose Coumadin while inpatient.  Amiodarone increased to 400 BID.  PTA warfarin dose: 2mg  MWF, 4mg  all other days per last anticoagulation clinic note on 10/10 (last dose 10/12 PTA)  Goal of Therapy:  INR goal 2-2.5 Monitor platelets by anticoagulation protocol: Yes   Plan:  1. No Coumadin tonight. 2. Daily PT/INR.  Uvaldo Rising, BCPS  Clinical Pharmacist Pager [phone]  08/18/2017 9:41 AM

## 2017-08-18 NOTE — Progress Notes (Signed)
Patient ID: [name], male   DOB: [birth year], 76 y.o.   MRN: [id number] HeartMate 2 Rounding Note  Subjective:    Patient presented to ER yesterday with increased dyspnea x several days.  He was noted to be in a slow VT with rate 110s-120s.  Attempted pace termination yesterday, unsuccessful.  DCCV at 200J x 1 back to NSR.  This am, a-paced.   MAP 84 this morning. Breathing better.  Good diuresis with IV Lasix 40 mg x 1 last night.    LVAD INTERROGATION:  HeartMate II LVAD:  Flow 3.2 liters/min, speed 8800 rpm, power 4.1, PI 7.4.  No events on log since DCCV.   Objective:    Vital Signs:   Temp:  [97.8 F (36.6 C)-98.4 F (36.9 C)] 97.8 F (36.6 C) (10/14 0801) Pulse Rate:  [59-113] 59 (10/14 0400) Resp:  [15-19] 17 (10/14 0400) BP: (99-106)/(74-87) 106/74 (10/13 1831) SpO2:  [95 %-97 %] 95 % (10/13 2220) Weight:  [199 lb 6.4 oz (90.4 kg)-200 lb (90.7 kg)] 200 lb (90.7 kg) (10/14 0557) Last BM Date: 08/17/17 Mean arterial Pressure 84  Intake/Output:   Intake/Output Summary (Last 24 hours) at 08/18/17 0903 Last data filed at 08/18/17 0700  Gross per 24 hour  Intake            456.6 ml  Output             2325 ml  Net          -1868.4 ml     Physical Exam: General:  Well appearing. No resp difficulty HEENT: normal Neck: supple. JVP 12 cm. Carotids 2+ bilat; no bruits. No lymphadenopathy or thryomegaly appreciated. Cor: Mechanical heart sounds with LVAD hum present. Lungs: clear Abdomen: soft, nontender, nondistended. No hepatosplenomegaly. No bruits or masses. Good bowel sounds. Driveline: C/D/I; securement device intact and driveline incorporated Extremities: no cyanosis, clubbing, rash.  1+ right ankle edema.  Neuro: alert & orientedx3, cranial nerves grossly intact. moves all 4 extremities w/o difficulty. Affect pleasant  Telemetry: a-paced, v-sensed (personally reviewed).   Labs: Basic Metabolic Panel:  Recent Labs Lab 08/17/17 1811 08/18/17 0301  NA  133* 137  K 4.5 3.9  CL 105 105  CO2 21* 25  GLUCOSE 102* 128*  BUN 29* 26*  CREATININE 1.51* 1.43*  CALCIUM 8.4* 8.5*  MG 2.1 2.2    Liver Function Tests:  Recent Labs Lab 08/17/17 1811  AST 26  ALT 21  ALKPHOS 106  BILITOT 0.9  PROT 6.6  ALBUMIN 3.7   No results for input(s): LIPASE, AMYLASE in the last 168 hours. No results for input(s): AMMONIA in the last 168 hours.  CBC:  Recent Labs Lab 08/17/17 1811 08/18/17 0301  WBC 5.4 6.0  HGB 10.3* 10.3*  HCT 32.0* 32.4*  MCV 94.7 94.5  PLT 154 158    INR:  Recent Labs Lab 08/13/17 08/13/17 1343 08/17/17 1811 08/17/17 2348 08/18/17 0301  INR 3.2* 3.2* 2.76 2.76 3.02    Other results:  EKG:   Imaging: Dg Chest Portable 1 View  Result Date: 08/17/2017 CLINICAL DATA:  76 year old male with shortness of breath today. LVAD. EXAM: PORTABLE CHEST 1 VIEW COMPARISON:  03/26/2017 and earlier. FINDINGS: Portable AP semi upright view at 1858 hours. LVAD configuration appears stable. Stable cardiomegaly and mediastinal contours. Stable left chest cardiac AICD. Increased interstitial opacity perhaps due to pulmonary vascularity. No pneumothorax or pleural effusion. No confluent opacity. Negative visible bowel gas pattern. IMPRESSION: 1. Increased bilateral pulmonary  interstitial opacity. Consider mild interstitial edema and viral/atypical respiratory infection. 2. No pleural fluid identified. Stable cardiomegaly. LVAD configuration appears stable. Electronically Signed   By: Genevie Ann M.D.   On: 08/17/2017 19:24      Medications:     Scheduled Medications: . atorvastatin  80 mg Oral q1800  . clopidogrel  75 mg Oral Daily  . docusate sodium  200 mg Oral BID  . Influenza vac split quadrivalent PF  0.5 mL Intramuscular Tomorrow-1000  . levothyroxine  200 mcg Oral Once per day on Sun Tue Wed Thu Fri Sat  . [START ON 08/19/2017] levothyroxine  300 mcg Oral Once per day on Mon  . metoprolol succinate  25 mg Oral BID  .  pantoprazole  40 mg Oral BID  . ranolazine  500 mg Oral BID  . Warfarin - Pharmacist Dosing Inpatient   Does not apply q1800     Infusions: . 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 Acute on chronic systolic HF with pulmonary edema: s/p HM-II VAD.  He presented with slow VT and CHF exacerbation.  Now s/p DCCV and a-paced in 60s.  Breathing better.  JVP elevated, suspect still with some volume overload.   - Will give dose of Lasix 40 mg IV x 1 today along with KCl.  He has not been on a diuretic at home.  - Continue warfarin per pharmacy.  2. LVAD: No evidence of pump thrombosis.  Parameters currently stable, no events on log since DCCV. LDH stable at 298, INR 3.  - Warfarin per pharmacy.  3. Recurrent slow VT: Triggered HF.  Unable to pace out, now s/p DCCV.  He is a-paced this morning.   - Start amiodarone 400 mg bid for reload (stop IV).  - Continue ranolazine.  4. H/o amiodarone induced-thyrotoxicosis: Watch closely with increased amio dosing. 5. Anemia of chronic disease: Hemoglobin stable.  6. PAF: continue amio and warfarin  I reviewed the LVAD parameters from today, and compared the results to the patient's prior recorded data.  No programming changes were made.  The LVAD is functioning within specified parameters.  The patient performs LVAD self-test daily.  LVAD interrogation was negative for any significant power changes, alarms or PI events/speed drops.  LVAD equipment check completed and is in good working order.  Back-up equipment present.   LVAD education done on emergency procedures and  precautions and reviewed exit site care.  Length of Stay: [name] 08/18/2017, 9:03 AM  VAD Team --- VAD ISSUES ONLY--- Pager [phone] (7am - 7am)  Advanced Heart Failure Team  Pager [phone] (M-F; 7a - 4p)  Please contact [facility] for night-coverage after hours (4p -7a ) and weekends on [email or web address]

## 2017-08-19 ENCOUNTER — Telehealth (HOSPITAL_COMMUNITY): Payer: Self-pay | Admitting: *Deleted

## 2017-08-19 ENCOUNTER — Other Ambulatory Visit (HOSPITAL_COMMUNITY): Payer: Self-pay | Admitting: *Deleted

## 2017-08-19 DIAGNOSIS — I4892 Unspecified atrial flutter: Secondary | ICD-10-CM

## 2017-08-19 DIAGNOSIS — I472 Ventricular tachycardia, unspecified: Secondary | ICD-10-CM

## 2017-08-19 DIAGNOSIS — I471 Supraventricular tachycardia: Secondary | ICD-10-CM

## 2017-08-19 DIAGNOSIS — Z95811 Presence of heart assist device: Secondary | ICD-10-CM

## 2017-08-19 LAB — CBC
HEMATOCRIT: 31.8 % — AB (ref 39.0–52.0)
HEMOGLOBIN: 10.3 g/dL — AB (ref 13.0–17.0)
MCH: 30.5 pg (ref 26.0–34.0)
MCHC: 32.4 g/dL (ref 30.0–36.0)
MCV: 94.1 fL (ref 78.0–100.0)
Platelets: 158 10*3/uL (ref 150–400)
RBC: 3.38 MIL/uL — AB (ref 4.22–5.81)
RDW: 17.8 % — ABNORMAL HIGH (ref 11.5–15.5)
WBC: 4.8 10*3/uL (ref 4.0–10.5)

## 2017-08-19 LAB — BASIC METABOLIC PANEL
Anion gap: 9 (ref 5–15)
BUN: 25 mg/dL — AB (ref 6–20)
CHLORIDE: 103 mmol/L (ref 101–111)
CO2: 24 mmol/L (ref 22–32)
Calcium: 8.5 mg/dL — ABNORMAL LOW (ref 8.9–10.3)
Creatinine, Ser: 1.4 mg/dL — ABNORMAL HIGH (ref 0.61–1.24)
GFR calc Af Amer: 54 mL/min — ABNORMAL LOW (ref >60)
GFR calc non Af Amer: 47 mL/min — ABNORMAL LOW (ref >60)
GLUCOSE: 99 mg/dL (ref 65–99)
POTASSIUM: 4.1 mmol/L (ref 3.5–5.1)
SODIUM: 136 mmol/L (ref 135–145)

## 2017-08-19 LAB — LACTATE DEHYDROGENASE: LDH: 306 U/L — ABNORMAL HIGH (ref 98–192)

## 2017-08-19 LAB — PROTIME-INR
INR: 3.19
PROTHROMBIN TIME: 32.4 s — AB (ref 11.4–15.2)

## 2017-08-19 MED ORDER — AMIODARONE HCL 200 MG PO TABS: 400.0000 mg | ORAL_TABLET | Freq: Two times a day (BID) | ORAL | 3 refills | Status: DC

## 2017-08-19 MED ORDER — RANOLAZINE ER 500 MG PO TB12: 500.0000 mg | ORAL_TABLET | Freq: Two times a day (BID) | ORAL | 6 refills | Status: DC

## 2017-08-19 NOTE — Progress Notes (Signed)
. Patient ID: [name], male   DOB: 16-May-1941, 76 y.o.   MRN: [id number] HeartMate 2 Rounding Note  Subjective:    Patient presented to ER yesterday with increased dyspnea x several days.  He was noted to be in a slow VT with rate 110s-120s.  Attempted pace termination yesterday, unsuccessful.  DCCV at 200J x 1 back to NSR.    No VT over night. Denies SOB.    LVAD INTERROGATION:  HeartMate II LVAD:  Flow 3.8 liters/min, speed 8800 rpm, power 4.5, PI 6.5 Objective:    Vital Signs:   Temp:  [97.4 F (36.3 C)-98.5 F (36.9 C)] 97.5 F (36.4 C) (10/15 0817) Pulse Rate:  [34-75] 60 (10/15 0927) Resp:  [14-23] 14 (10/15 0817) SpO2:  [96 %-98 %] 98 % (10/15 0817) Weight:  [198 lb 6.6 oz (90 kg)] 198 lb 6.6 oz (90 kg) (10/15 0620) Last BM Date: 08/18/17 Mean arterial Pressure 70s   Intake/Output:   Intake/Output Summary (Last 24 hours) at 08/19/17 1028 Last data filed at 08/19/17 0935  Gross per 24 hour  Intake              960 ml  Output             2775 ml  Net            -1815 ml      Physical Exam: GENERAL: Well appearing. Sitting in the chair. HEENT: normal  NECK: Supple, JVP 6-7  .  2+ bilaterally, no bruits.  No lymphadenopathy or thyromegaly appreciated.   CARDIAC:  Mechanical heart sounds with LVAD hum present.  LUNGS:  Clear to auscultation bilaterally.  ABDOMEN:  Soft, round, nontender, positive bowel sounds x4.     LVAD exit site: .  Dressing dry and intact.  No erythema or drainage.  Stabilization device present and accurately applied.   EXTREMITIES:  Warm and dry, no cyanosis, clubbing, rash or edema  NEUROLOGIC:  Alert and oriented x 4.  Gait steady.  No aphasia.  No dysarthria.  Affect pleasant.      Telemetry: A paced V sensed. Personally reviewed.   Labs: Basic Metabolic Panel:  Recent Labs Lab 08/17/17 1811 08/18/17 0301 08/19/17 0304  NA 133* 137 136  K 4.5 3.9 4.1  CL 105 105 103  CO2 21* 25 24  GLUCOSE 102* 128* 99  BUN 29* 26* 25*   CREATININE 1.51* 1.43* 1.40*  CALCIUM 8.4* 8.5* 8.5*  MG 2.1 2.2  --     Liver Function Tests:  Recent Labs Lab 08/17/17 1811  AST 26  ALT 21  ALKPHOS 106  BILITOT 0.9  PROT 6.6  ALBUMIN 3.7   No results for input(s): LIPASE, AMYLASE in the last 168 hours. No results for input(s): AMMONIA in the last 168 hours.  CBC:  Recent Labs Lab 08/17/17 1811 08/18/17 0301 08/19/17 0304  WBC 5.4 6.0 4.8  HGB 10.3* 10.3* 10.3*  HCT 32.0* 32.4* 31.8*  MCV 94.7 94.5 94.1  PLT 154 158 158    INR:  Recent Labs Lab 08/13/17 1343 08/17/17 1811 08/17/17 2348 08/18/17 0301 08/19/17 0304  INR 3.2* 2.76 2.76 3.02 3.19    Other results:  EKG:   Imaging: Dg Chest Portable 1 View  Result Date: 08/17/2017 CLINICAL DATA:  76 year old male with shortness of breath today. LVAD. EXAM: PORTABLE CHEST 1 VIEW COMPARISON:  03/26/2017 and earlier. FINDINGS: Portable AP semi upright view at 1858 hours. LVAD configuration appears stable. Stable  cardiomegaly and mediastinal contours. Stable left chest cardiac AICD. Increased interstitial opacity perhaps due to pulmonary vascularity. No pneumothorax or pleural effusion. No confluent opacity. Negative visible bowel gas pattern. IMPRESSION: 1. Increased bilateral pulmonary interstitial opacity. Consider mild interstitial edema and viral/atypical respiratory infection. 2. No pleural fluid identified. Stable cardiomegaly. LVAD configuration appears stable. Electronically Signed   By: Genevie Ann M.D.   On: 08/17/2017 19:24     Medications:     Scheduled Medications: . amiodarone  400 mg Oral BID  . atorvastatin  80 mg Oral q1800  . clopidogrel  75 mg Oral Daily  . docusate sodium  200 mg Oral BID  . Influenza vac split quadrivalent PF  0.5 mL Intramuscular Tomorrow-1000  . levothyroxine  200 mcg Oral Once per day on Sun Tue Wed Thu Fri Sat  . levothyroxine  300 mcg Oral Once per day on Mon  . metoprolol succinate  25 mg Oral BID  .  pantoprazole  40 mg Oral BID  . ranolazine  500 mg Oral BID  . Warfarin - Pharmacist Dosing Inpatient   Does not apply q1800    Infusions: . amiodarone Stopped (08/18/17 1045)    PRN Medications: acetaminophen, nitroGLYCERIN, ondansetron (ZOFRAN) IV, propofol   Assessment:   [name] is a 76 yo male with h/o VT, PAD and severe systolic HF (EF 31-54%) due to mixed ischemic/nonischemic CM he is s/p HM II LVAD implant for destination therapy on October 19, 2013.VAD admitted with recurrent HF in setting of recurrent slow VT  Plan/Discussion:    1. Acute on chronic systolic HF with pulmonary edema: s/p HM-II VAD.  He presented with slow VT and CHF exacerbation.  Now s/p DCCV and a-paced in 60s.   Volume status stable.  -INR 3.19. Discussed with pharmacy.   2. LVAD: No evidence of pump thrombosis.   Vad parameters stable. LDH stable.  3. Recurrent slow VT: Triggered HF.  Unable to pace out, now s/p DCCV.  He is a-paced this morning.   - Continue  amiodarone 400 mg bid for reload (stop IV). Will plan to cut back next week.  - Continue ranolazine.  4. H/o amiodarone induced-thyrotoxicosis: Watch closely with increased amio dosing. He has follow up with Dr Caryl Comes tomorrow.  5. Anemia of chronic disease: Hemoglobin stable.  6. PAF: continue amio and warfarin.  Home today. VAD follow up 08/27/2017 at 1000.  I reviewed the LVAD parameters from today, and compared the results to the patient's prior recorded data.  No programming changes were made.  The LVAD is functioning within specified parameters.  The patient performs LVAD self-test daily.  LVAD interrogation was negative for any significant power changes, alarms or PI events/speed drops.  LVAD equipment check completed and is in good working order.  Back-up equipment present.   LVAD education done on emergency procedures and precautions and reviewed exit site care.  Length of Stay: 2  Amy Clegg NP-C  08/19/2017, 10:28 AM  VAD  Team --- VAD ISSUES ONLY--- Pager [phone] (7am - 7am)  Advanced Heart Failure Team  Pager [phone] (M-F; 7a - 4p)  Please contact [facility] for night-coverage after hours (4p -7a ) and weekends on [email or web address]  Patient seen and examined with Darrick Grinder, NP. We discussed all aspects of the encounter. I agree with the assessment and plan as stated above.   No further VT on monitor. Volume status improved with diuresis. VAD parameters stable. I discussed with EP who agree with plan.  [place] for d/c today with close f/u in HF Clinic.   Glori Bickers, MD  10:52 PM

## 2017-08-19 NOTE — Progress Notes (Signed)
Discharged home accompanied by wife, discharge instructions given to pt. Belongings taken home. 

## 2017-08-19 NOTE — Discharge Summary (Signed)
Advanced Heart Failure Team  Discharge Summary   Patient ID: [name] MRN: [id number], DOB/AGE: 1941/09/01 76 y.o. Admit date: 08/17/2017 D/C date:     08/19/2017   Primary Discharge Diagnoses:  1. A/C Systolic Heart Failure with pulmonary edema 2. VT- S/P DC-CV 10/13 200J x1.   Amio 400 mg twice a day 3. LVAD HMII  On coumadin. INR Goal 2-2.5  4. H/O amiodarone induced thyrotoxicosis 5. Anemia - Chronic Disease  6. [facility]  Hospital Course:  [name] is a 76 yo male with h/o VT, PAD and severe systolic HF (EF 43-32%) due to mixed ischemic/nonischemic CM he is s/p HM II LVAD implant for destination therapy on October 19, 2013.  Admitted with recurrent HF in setting of recurrent slow VT. Device interrogation showed slow VT with rates 110-120s. Pace termination was not successful so he underwent DC-CV at 200Jx1 with prompt conversion to NSR. Plan to continue amiodarone 400 mg twice a day for now and start taper next week.   Mild volume overload on admit likely precipitated by VT. Diuresed a couple pounds with IV lasix. He will continue diuretics as needed. with IV lasix.   LVAD parameters stable with restoration of NSR. INR followed closely. Coumadin held on the day discharge. Plan to repeat INR later this week. Discussed with outpatient heart failure/lvad pharmacist.   He will continue to be followed closely in the LVAD and has follow up next week. Tomorrow he will follow up with Dr Caryl Comes regarding VT and amio induced thyrotoxicosis.      LVAD INTERROGATION:  HeartMate II LVAD:  Flow 3.8 liters/min, speed 8800 rpm, power 4.5, PI 6.5 Discharge Weight: 198 pounds.  Discharge Vitals: Blood pressure 106/74, pulse 60, temperature (!) 97.5 F (36.4 C), temperature source Oral, resp. rate 14, height 6' (1.829 m), weight 198 lb 6.6 oz (90 kg), SpO2 98 %.  Labs: Lab Results  Component Value Date   WBC 4.8 08/19/2017   HGB 10.3 (L) 08/19/2017   HCT 31.8 (L) 08/19/2017   MCV  94.1 08/19/2017   PLT 158 08/19/2017    Recent Labs Lab 08/17/17 1811  08/19/17 0304  NA 133*  < > 136  K 4.5  < > 4.1  CL 105  < > 103  CO2 21*  < > 24  BUN 29*  < > 25*  CREATININE 1.51*  < > 1.40*  CALCIUM 8.4*  < > 8.5*  PROT 6.6  --   --   BILITOT 0.9  --   --   ALKPHOS 106  --   --   ALT 21  --   --   AST 26  --   --   GLUCOSE 102*  < > 99  < > = values in this interval not displayed. Lab Results  Component Value Date   CHOL 106 11/05/2015   HDL 33 (L) 11/05/2015   LDLCALC 60 11/05/2015   TRIG 67 11/05/2015   BNP (last 3 results)  Recent Labs  08/17/17 1900  BNP 627.8*    ProBNP (last 3 results) No results for input(s): PROBNP in the last 8760 hours.   Diagnostic Studies/Procedures   Dg Chest Portable 1 View  Result Date: 08/17/2017 CLINICAL DATA:  76 year old male with shortness of breath today. LVAD. EXAM: PORTABLE CHEST 1 VIEW COMPARISON:  03/26/2017 and earlier. FINDINGS: Portable AP semi upright view at 1858 hours. LVAD configuration appears stable. Stable cardiomegaly and mediastinal contours. Stable left chest cardiac AICD. Increased  interstitial opacity perhaps due to pulmonary vascularity. No pneumothorax or pleural effusion. No confluent opacity. Negative visible bowel gas pattern. IMPRESSION: 1. Increased bilateral pulmonary interstitial opacity. Consider mild interstitial edema and viral/atypical respiratory infection. 2. No pleural fluid identified. Stable cardiomegaly. LVAD configuration appears stable. Electronically Signed   By: Genevie Ann M.D.   On: 08/17/2017 19:24    Discharge Medications   Allergies as of 08/19/2017      Reactions   Demerol [meperidine] Other (See Comments)   Paralysis. Could only move eyes.      Medication List    TAKE these medications   amiodarone 200 MG tablet Commonly known as:  PACERONE Take 2 tablets (400 mg total) by mouth 2 (two) times daily. What changed:  how much to take  when to take this    atorvastatin 80 MG tablet Commonly known as:  LIPITOR TAKE 1 TABLET DAILY AT 6 PM What changed:  See the new instructions.   clopidogrel 75 MG tablet Commonly known as:  PLAVIX Take 1 tablet (75 mg total) by mouth daily.   docusate sodium 100 MG capsule Commonly known as:  COLACE Take 200 mg by mouth 2 (two) times daily.   ICAPS PO Take 1 tablet by mouth 2 (two) times daily.   levothyroxine 200 MCG tablet Commonly known as:  SYNTHROID, LEVOTHROID Take 200 mcg (1 tab) by mouth daily except take 300 mcg (1 and 1/2 tab) by mouth every Monday.   metoprolol succinate 25 MG 24 hr tablet Commonly known as:  TOPROL-XL Take 1 tablet (25 mg total) by mouth 2 (two) times daily.   nitroGLYCERIN 0.4 MG SL tablet Commonly known as:  NITROSTAT Place 0.4 mg under the tongue every 5 (five) minutes as needed for chest pain.   pantoprazole 40 MG tablet Commonly known as:  PROTONIX Take 1 tablet (40 mg total) by mouth 2 (two) times daily.   RANEXA 500 MG 12 hr tablet Generic drug:  ranolazine TAKE 1 TABLET TWICE DAILY What changed:  See the new instructions.   ranolazine 500 MG 12 hr tablet Commonly known as:  RANEXA Take 1 tablet (500 mg total) by mouth 2 (two) times daily. What changed:  how much to take  how to take this  when to take this  additional instructions   warfarin 2 MG tablet Commonly known as:  COUMADIN Take 2-4 mg by mouth daily. Take 1 tablet (2 mg) Mon, Wed, Fri; take 2 tablets (4mg ) Tues, Thur, Sat, Sun.   zinc gluconate 50 MG tablet Take 50 mg by mouth 2 (two) times daily.       Disposition   The patient will be discharged in stable condition to home. Discharge Instructions    (HEART FAILURE PATIENTS) Call MD:  Anytime you have any of the following symptoms: 1) 3 pound weight gain in 24 hours or 5 pounds in 1 week 2) shortness of breath, with or without a dry hacking cough 3) swelling in the hands, feet or stomach 4) if you have to sleep on extra  pillows at night in order to breathe.    Complete by:  As directed    Diet - low sodium heart healthy    Complete by:  As directed    Heart Failure patients record your daily weight using the same scale at the same time of day    Complete by:  As directed    INR  Goal: 1.8 - 2.3    Complete by:  As directed    Goal:  1.8 - 2.3   Increase activity slowly    Complete by:  As directed    Page VAD Coordinator at [phone]  Notify for: any VAD alarms, sustained elevations of power >10 watts, sustained drop in Pulse Index <3    Complete by:  As directed    Notify for:   any VAD alarms sustained elevations of power >10 watts sustained drop in Pulse Index <3     Speed Settings:    Complete by:  As directed    Fixed 8800 RPM Low 8200 RPM     Follow-up Information    Ariah Mower, Shaune Pascal, MD Follow up on 08/27/2017.   Specialty:  Cardiology Why:  at [place] information: [place] [address] [phone]             Duration of Discharge Encounter: Greater than 35 minutes   Signed, Darrick Grinder  NP-C  08/19/2017, 11:59 AM  Agree with above.   No further VT on monitor. Volume status improved with diuresis. VAD parameters stable. I discussed with EP who agree with plan.   [place] for d/c today with close f/u in HF Clinic.   Glori Bickers, MD  10:53 PM

## 2017-08-19 NOTE — Telephone Encounter (Signed)
Late Entry: Received page from [name], significant other, stating patient had increased shortness of breath and felt very tired. He has had no VAD alarms. Patient stated he was planning to come to [facility] ED. Dr Aundra Dubin, rapid response, and ED charge nurse made aware. Will follow.   Balinda Quails RN, VAD Coordinator 24/7 pager [phone]

## 2017-08-19 NOTE — Progress Notes (Addendum)
ANTICOAGULATION CONSULT NOTE - Follow Up Consult  Pharmacy Consult for Coumadin Indication: LVAD  Allergies  Allergen Reactions  . Demerol [Meperidine] Other (See Comments)    Paralysis. Could only move eyes.     Patient Measurements: Height: 6' (182.9 cm) Weight: 198 lb 6.6 oz (90 kg) IBW/kg (Calculated) : 77.6  Vital Signs: Temp: 97.5 F (36.4 C) (10/15 0817) Temp Source: Oral (10/15 0817) Pulse Rate: 34 (10/15 0620)  Labs:  Recent Labs  08/17/17 1811 08/17/17 2348 08/18/17 0301 08/19/17 0304  HGB 10.3*  --  10.3* 10.3*  HCT 32.0*  --  32.4* 31.8*  PLT 154  --  158 158  LABPROT 28.9* 29.0* 31.0* 32.4*  INR 2.76 2.76 3.02 3.19  CREATININE 1.51*  --  1.43* 1.40*    Estimated Creatinine Clearance: 48.5 mL/min (A) (by C-G formula based on SCr of 1.4 mg/dL (H)).   Medications:  Scheduled:  . amiodarone  400 mg Oral BID  . atorvastatin  80 mg Oral q1800  . clopidogrel  75 mg Oral Daily  . docusate sodium  200 mg Oral BID  . Influenza vac split quadrivalent PF  0.5 mL Intramuscular Tomorrow-1000  . levothyroxine  200 mcg Oral Once per day on Sun Tue Wed Thu Fri Sat  . levothyroxine  300 mcg Oral Once per day on Mon  . metoprolol succinate  25 mg Oral BID  . pantoprazole  40 mg Oral BID  . ranolazine  500 mg Oral BID  . Warfarin - Pharmacist Dosing Inpatient   Does not apply q1800    Assessment: 76 yo male on warfarin PTA for LAVD, afib/flutter admitted with slow VT. Pt to resume warfarin per pharmacy. INR on admit slightly supratherapeutic at 2.76, now up to 3.19. LDH stable, CBC stable, no S/Sx bleeding noted.  Given elevated INR and increased amiodarone dose, will need empiric dose reduction at discharge. PTA warfarin dose: 2mg  MWF, 4mg  all other days per last anticoagulation clinic note on 10/10.  Goal of Therapy:  INR goal 2-2.5 Monitor platelets by anticoagulation protocol: Yes   Plan:  -Hold warfarin again tonight -Daily INR -Consider 4mg   Thurs/Sun, 2mg  all other days at discharge  Arrie Senate, PharmD PGY-2 Cardiology Pharmacy Resident Pager: [phone] 08/19/2017

## 2017-08-19 NOTE — Progress Notes (Signed)
LVAD Coordinator Rounding Note:  Admitted 08/17/17 due to slow VT with VA conduction (vs recurrent atrial tach) at 115.  HM II LVAD implated on 10/19/13 by Dr. Cyndia Bent under Destination Therapy criteria.  Vital signs: HR: 61 Doppler Pressure: 70 Automatic BP: not done O2 Sat: 98 Wt: 199>200>198 lbs   LVAD interrogation reveals:  Speed: 8800 Flow: 4.0 Power:  4.5 PI: 6.5 Alarms: 08/17/17 - 5 Low flow    08/14/17 - 10 low flow      07/27/17 - 1 low flow   Events: rare PI over last 24 hrs  Fixed speed: 8800 Low speed limit: 8200  Patient has back up black bag with extra controller, battery clips, and back up batteries.   Drive Line: dressing dry and intact; pt changes dressing weekly using weekly dressing kit with Sorbaview dressing and biopatch.   Labs:  LDH trend: 340>353>298>306  INR trend: 2.76>3.02>3.19  Anticoagulation Plan: -INR Goal: 2.0 - 2.5 -ASA Dose: no ASA due to hx of GI bleed  Device: - Medtronic dual -Therapies: on  Arrythmias: Slow VT 08/17/17; unable to pace terminate; DC-CV terminated.   Adverse Events on VAD: 02/2015 >>GIB (removed ASA, 2-2.5 INR) 10/2015 >>CVA(Plavix started) 02/2017> GIB (removed ASA, scopes negative) 03/2017> percutaneous lead repair History of amiodarone induced-thyrotoxicosis  Plan/Recommendations:   1. Discharge home today - will f/u in VAD clinic next week. Plan to decrease Amiodarone to 200 mg bid next week. 2.  Call [place] pager if any questions re: VAD equipment.  Zada Girt RN, VAD Coordinator [phone] 24/7 VAD pager: [phone]

## 2017-08-20 ENCOUNTER — Other Ambulatory Visit: Payer: Self-pay | Admitting: Unknown Physician Specialty

## 2017-08-20 ENCOUNTER — Encounter: Payer: Self-pay | Admitting: Internal Medicine

## 2017-08-20 ENCOUNTER — Ambulatory Visit (INDEPENDENT_AMBULATORY_CARE_PROVIDER_SITE_OTHER): Payer: Medicare Other | Admitting: Internal Medicine

## 2017-08-20 ENCOUNTER — Encounter (HOSPITAL_COMMUNITY): Payer: Self-pay | Admitting: *Deleted

## 2017-08-20 VITALS — HR 77 | Ht 72.0 in | Wt 211.0 lb

## 2017-08-20 DIAGNOSIS — I255 Ischemic cardiomyopathy: Secondary | ICD-10-CM | POA: Diagnosis not present

## 2017-08-20 DIAGNOSIS — I5022 Chronic systolic (congestive) heart failure: Secondary | ICD-10-CM

## 2017-08-20 DIAGNOSIS — I472 Ventricular tachycardia, unspecified: Secondary | ICD-10-CM

## 2017-08-20 MED ORDER — AMIODARONE HCL 400 MG PO TABS: 400.0000 mg | ORAL_TABLET | Freq: Two times a day (BID) | ORAL | 6 refills | Status: DC

## 2017-08-20 NOTE — Progress Notes (Signed)
Instructed patient to take home inr lab test on Friday. Increased Amiodarone to 400mg  BID. Patient will inform us of result.  Balinda Quails RN, VAD Coordinator 24/7 pager [phone]

## 2017-08-20 NOTE — Progress Notes (Signed)
[name] changing her Lasix to 3 times a week      Patient Care Team: Bensimhon, Shaune Pascal, MD as PCP - General (Cardiology) Bensimhon, Shaune Pascal, MD as Consulting Physician (Cardiology) Gaye Pollack, MD as Consulting Physician (Cardiothoracic Surgery)   HPI  [name] is a 76 y.o. male Seen in followup for ventricular tachycardia occurring in the context of ischemic heart disease prior revascularization MI. He has had significant problems with ICD discharge and underwent catheter ablation November 2012. He underwent device revision with insertion of an atrial lead and generator replacement 7/14  He underwent repeat ablation August 2014 He was rendered noninducible at this procedure also. He has had recurrent ventricular tachycardia. But non since 1/17 Catheterization was recently demonstrated two-vessel coronary disease with patent stents in the chronically totally occluded RCA ejection fraction of 25-30% Myoview scan demonstrated a large inferolateral infarct.  Echocardiogram 8/14 demonstrated EF of 15-20%   He s/p  VAD insertion 2014  He was recently hopsitalized for CHF acute chronic and was found to have VT below the detection rate--he was cardioverted and the amio uptitrated  There are concerns re impact on thyroid     Past Medical History:  Diagnosis Date  . Arthritis   . Automatic implantable cardioverter-defibrillator in situ    a. s/p Medtronic Evera device serial [id number]    . Bradycardia   . CAD (coronary artery disease)    a. s/p MI 1982;  s/p DES to CFX 2011;  s/p NSTEMI 4/12 in setting of VTach;  cath 7/12:  LM ok, LAD mid 30-40%, Dx's with 40%, mCFX stent patent, prox to mid RCA occluded with R to R and L to R collats.  Medical Tx was continued  . Chronic systolic heart failure ([place])    a. s/p LVAD 10/2013 for destination therapy  . CKD (chronic kidney disease)   . CVD (cerebrovascular disease)    a. s/p L CEA 2008  . Cytopenia   . Dysrhythmia    AFIB   . History of GI bleed    a. on ASA- started on plavix during 10/2015 admission for CVA  . HLD (hyperlipidemia)   . HTN (hypertension)   . Hypothyroidism   . Inappropriate therapy from implantable cardioverter-defibrillator    a. ATP for sinus tach SVT wavelet identified SVT but was no  passive (2014)  . Ischemic cardiomyopathy   . Melanoma of ear ([place])    a. L Ear   . Myocardial infarction ([place])    1992  . PAF (paroxysmal atrial fibrillation) ([place])   . PVD (peripheral vascular disease) ([place])    a. 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 Shortness of breath dyspnea    W/ EXERTION   . Sleep apnea    NO CPAP NOW  HE SENT IT BACK YRS AGO  . Stroke [facility])    a. 10/2015 admission   . Ventricular tachycardia ([place])    a. s/p AICD;   h/o ICD shock;  Amiodarone Rx. S/P ablation in 2012    Past Surgical History:  Procedure Laterality Date  . BACK SURGERY    . CARDIAC CATHETERIZATION     "several" (05/25/2013)  . CARDIAC DEFIBRILLATOR PLACEMENT  2003; 2005; 05/25/2013   2014: Medtronic Evera device serial number [id number]  . CARDIAC ELECTROPHYSIOLOGY STUDY AND ABLATION  2012  .  CARDIOVERSION N/A 10/15/2013   Procedure: CARDIOVERSION;  Surgeon: Jolaine Artist, MD;  Location: [place];  Service: Cardiovascular;  Laterality: N/A;  . CARDIOVERSION N/A 11/04/2013   Procedure: CARDIOVERSION;  Surgeon: Larey Dresser, MD;  Location: [place];  Service: Cardiovascular;  Laterality: N/A;  . CARDIOVERSION N/A 06/04/2014   Procedure: CARDIOVERSION;  Surgeon: Jolaine Artist, MD;  Location: [facility] ENDOSCOPY;  Service: Cardiovascular;  Laterality: N/A;  . CARDIOVERSION N/A 03/02/2015   Procedure: CARDIOVERSION;  Surgeon: Jolaine Artist, MD;  Location: [facility] ENDOSCOPY;  Service: Cardiovascular;  Laterality: N/A;  . CARDIOVERSION N/A 01/19/2016   Procedure: CARDIOVERSION;  Surgeon: Larey Dresser, MD;   Location: [place];  Service: Cardiovascular;  Laterality: N/A;  . CAROTID ENDARTERECTOMY Left ~ 2007  . CATARACT EXTRACTION W/ INTRAOCULAR LENS  IMPLANT, BILATERAL Bilateral 2000  . CHOLECYSTECTOMY  12/15/?2010  . COLONOSCOPY WITH PROPOFOL N/A 2020-06-117   Procedure: COLONOSCOPY WITH PROPOFOL;  Surgeon: Ronald Lobo, MD;  Location: [place];  Service: Endoscopy;  Laterality: N/A;  . CORONARY ANGIOPLASTY  1992  . CORONARY ANGIOPLASTY WITH STENT PLACEMENT     "last one was 11/2012  (05/25/2013)  . CYSTOSCOPY WITH RETROGRADE PYELOGRAM, URETEROSCOPY AND STENT PLACEMENT Left 08/09/2014   Procedure: CYSTOSCOPY WITH RETROGRADE PYELOGRAM, URETEROSCOPY AND STENT PLACEMENT;  Surgeon: Bernestine Amass, MD;  Location: [facility];  Service: Urology;  Laterality: Left;  . CYSTOSCOPY WITH RETROGRADE PYELOGRAM, URETEROSCOPY AND STENT PLACEMENT Left 09/01/2014   Procedure: CYSTOSCOPY WITH URETEROSCOPY AND STENT REMOVAL;  Surgeon: Bernestine Amass, MD;  Location: [facility];  Service: Urology;  Laterality: Left;  . CYSTOSCOPY WITH RETROGRADE PYELOGRAM, URETEROSCOPY AND STENT PLACEMENT Left 09/20/2014   Procedure: CYSTOSCOPY WITH RETROGRADE PYELOGRAM, URETEROSCOPY AND STENT PLACEMENT, stone removal;  Surgeon: Bernestine Amass, MD;  Location: [facility];  Service: Urology;  Laterality: Left;  . DOPPLER ECHOCARDIOGRAPHY  2011  . ELBOW ARTHROSCOPY Right 1990's  . ELECTROPHYSIOLOGIC STUDY N/A 07/19/2016   Procedure: Cardioversion;  Surgeon: Evans Lance, MD;  Location: [place] CV LAB;  Service: Cardiovascular;  Laterality: N/A;  . ENDARTERECTOMY Right 01/13/2016   Procedure: RIGHT CAROTID ARTERY ENDARTERECTOMY;  Surgeon: Mal Misty, MD;  Location: [place];  Service: Vascular;  Laterality: Right;  . ENTEROSCOPY Left 02/23/2017   Procedure: ENTEROSCOPY;  Surgeon: Ronald Lobo, MD;  Location[facility] ENDOSCOPY;  Service: Endoscopy;  Laterality: Left;  . ESOPHAGOGASTRODUODENOSCOPY N/A 02/15/2015   Procedure:  ESOPHAGOGASTRODUODENOSCOPY (EGD);  Surgeon: Carol Ada, MD;  Location: [facility] ENDOSCOPY;  Service: Endoscopy;  Laterality: N/A;  LVAD  . EYE SURGERY     VITRECTOMY  LEFT 05/2012  . FEMORAL-POPLITEAL BYPASS GRAFT Right 2011  . FEMORAL-POPLITEAL BYPASS GRAFT Left 05/2002   Archie Endo 05/06/2002 (05/25/2013)  . HEEL SPUR SURGERY Right 1990's  . HOLMIUM LASER APPLICATION Left 22/12/5425   Procedure: HOLMIUM LASER APPLICATION;  Surgeon: Bernestine Amass, MD;  [facility];  Service: Urology;  Laterality: Left;  . IMPLANTABLE CARDIOVERTER DEFIBRILLATOR GENERATOR CHANGE N/A 05/25/2013   Procedure: IMPLANTABLE CARDIOVERTER DEFIBRILLATOR GENERATOR CHANGE;  Surgeon: Deboraha Sprang, MD;  Location: [facility] CATH LAB;  Service: Cardiovascular;  Laterality: N/A;  . INSERTION OF IMPLANTABLE LEFT VENTRICULAR ASSIST DEVICE N/A 10/19/2013   Procedure: INSERTION OF IMPLANTABLE LEFT VENTRICULAR ASSIST DEVICE;  Surgeon: Gaye Pollack, MD;  Location: [place];  Service: Open Heart Surgery;  Laterality: N/A;  CIRC ARREST  NITRIC OXIDE  MEDTRONIC ICD  . INTRAOPERATIVE TRANSESOPHAGEAL ECHOCARDIOGRAM N/A 10/19/2013   Procedure: INTRAOPERATIVE TRANSESOPHAGEAL ECHOCARDIOGRAM;  Surgeon:  Gaye Pollack, MD;  Location: [place];  Service: Open Heart Surgery;  Laterality: N/A;  . KNEE ARTHROSCOPY Bilateral 1990's  . LEAD REVISION N/A 06/05/2013   Procedure: LEAD REVISION;  Surgeon: Thompson Grayer, MD;  Location[facility] LAB;  Service: Cardiovascular;  Laterality: N/A;  . LEFT HEART CATHETERIZATION WITH CORONARY ANGIOGRAM N/A 11/24/2012   Procedure: LEFT HEART CATHETERIZATION WITH CORONARY ANGIOGRAM;  Surgeon: Peter M Martinique, MD;  Location: [facility] CATH LAB;  Service: Cardiovascular;  Laterality: N/A;  . LIPOMA EXCISION Left 2013   "near ear" (05/25/2013)  . [place]; 2000's  . PATCH ANGIOPLASTY Right 01/13/2016   Procedure: WITH  PATCH ANGIOPLASTY;  Surgeon: Mal Misty, MD;  Location: [place];  Service: Vascular;  Laterality: Right;    . RENAL ARTERY BYPASS Bilateral 2003  . REVASCULARIZATION / IN-SITU GRAFT LEG    . RIGHT HEART CATHETERIZATION N/A 09/17/2013   Procedure: RIGHT HEART CATH;  Surgeon: Jolaine Artist, MD;  Location[facility] CATH LAB;  Service: Cardiovascular;  Laterality: N/A;  . RIGHT HEART CATHETERIZATION N/A 10/14/2013   Procedure: RIGHT HEART CATH;  Surgeon: Jolaine Artist, MD;  Location: [facility] CATH LAB;  Service: Cardiovascular;  Laterality: N/A;  . RIGHT HEART CATHETERIZATION N/A 11/16/2013   Procedure: RIGHT HEART CATH;  Surgeon: Jolaine Artist, MD;  Location: [facility] CATH LAB;  Service: Cardiovascular;  Laterality: N/A;  . RIGHT HEART CATHETERIZATION N/A 07/13/2014   Procedure: RIGHT HEART CATH;  Surgeon: Jolaine Artist, MD;  Location: [facility] CATH LAB;  Service: Cardiovascular;  Laterality: N/A;  . TEE WITHOUT CARDIOVERSION N/A 11/04/2013   Procedure: TRANSESOPHAGEAL ECHOCARDIOGRAM (TEE);  Surgeon: Larey Dresser, MD;  Location: [place];  Service: Cardiovascular;  Laterality: N/A;  . TEE WITHOUT CARDIOVERSION N/A 03/02/2015   Procedure: TRANSESOPHAGEAL ECHOCARDIOGRAM (TEE);  Surgeon: Jolaine Artist, MD;  Location: [facility] ENDOSCOPY;  Service: Cardiovascular;  Laterality: N/A;  . TEE WITHOUT CARDIOVERSION N/A 01/19/2016   Procedure: TRANSESOPHAGEAL ECHOCARDIOGRAM (TEE);  Surgeon: Larey Dresser, MD;  Location: [place];  Service: Cardiovascular;  Laterality: N/A;  . TONSILLECTOMY  1946  . TUMOR EXCISION Left 1960's   "fatty tumor" (05/25/2013)  . V-TACH ABLATION N/A 09/12/2011   Procedure: V-TACH ABLATION;  Surgeon: Evans Lance, MD;  Location: [facility] CATH LAB;  Service: Cardiovascular;  Laterality: N/A;  . V-TACH ABLATION N/A 06/08/2013   Procedure: V-TACH ABLATION;  Surgeon: Evans Lance, MD;  Location: [facility] CATH LAB;  Service: Cardiovascular;  Laterality: N/A;    Current Outpatient Prescriptions  Medication Sig Dispense Refill  . amiodarone (PACERONE) 400 MG tablet Take 1 tablet (400 mg total) by  mouth 2 (two) times daily. 60 tablet 6  . atorvastatin (LIPITOR) 80 MG tablet TAKE 1 TABLET DAILY AT 6 PM (Patient taking differently: TAKE 1 TABLET (80 mg) DAILY in the morning) 30 tablet 11  . clopidogrel (PLAVIX) 75 MG tablet Take 1 tablet (75 mg total) by mouth daily. 90 tablet 3  . docusate sodium (COLACE) 100 MG capsule Take 200 mg by mouth 2 (two) times daily.     [name] levothyroxine (SYNTHROID, LEVOTHROID) 200 MCG tablet Take 200 mcg (1 tab) by mouth daily except take 300 mcg (1 and 1/2 tab) by mouth every Monday.    . metoprolol succinate (TOPROL-XL) 25 MG 24 hr tablet Take 1 tablet (25 mg total) by mouth 2 (two) times daily. 180 tablet 3  . Multiple Vitamins-Minerals (ICAPS PO) Take 1 tablet by mouth 2 (two)  times daily.    . nitroGLYCERIN (NITROSTAT) 0.4 MG SL tablet Place 0.4 mg under the tongue every 5 (five) minutes as needed for chest pain.    . pantoprazole (PROTONIX) 40 MG tablet Take 1 tablet (40 mg total) by mouth 2 (two) times daily. 180 tablet 6  . ranolazine (RANEXA) 500 MG 12 hr tablet Take 1 tablet (500 mg total) by mouth 2 (two) times daily. 60 tablet 6  . warfarin (COUMADIN) 2 MG tablet Take 2-4 mg by mouth daily. Take 1 tablet (2 mg) Mon, Wed, Fri; take 2 tablets (4mg ) Tues, Thur, Sat, Sun.    . zinc gluconate 50 MG tablet Take 50 mg by mouth 2 (two) times daily.      No current facility-administered medications for this visit.     Allergies  Allergen Reactions  . Demerol [Meperidine] Other (See Comments)    Paralysis. Could only move eyes.     Review of Systems negative except from HPI and PMH  Physical Exam Pulse 77   Ht 6' (1.829 m)   Wt 211 lb (95.7 kg)   SpO2 98%   BMI 28.62 kg/m  Well developed and nourished in no acute distress HENT normal Neck supple with JVP-flat Carotids brisk and full without bruits Clear Regular rate and rhythm, no murmurs or gallops Hum Abd-soft with active BS without hepatomegaly No Clubbing cyanosis edema Skin-warm and  dry A & Oriented  Grossly normal sensory and motor function     Assessment and  Plan  CHF -acute chronic systolic   ICD-Medtronic The patient's device was interrogated.  The information was reviewed. No changes were made in the programming.   '  LVAD  ISCHEMIC CARDIOMYOPATHY  TIA/CVA  VT  AFib  Loss of R waves-ICD  Treated hypothyroidism  Sinus node dysfunction   Will decrease amio as noted in hospital  TSH Is falling and will need followup and downtitration   reviewed the physiology with family

## 2017-08-20 NOTE — Patient Instructions (Signed)
Medication Instructions:  Your physician recommends that you continue on your current medications as directed. Please refer to the Current Medication list given to you today.  -- If you need a refill on your cardiac medications before your next appointment, please call your pharmacy. --  Labwork: None ordered  Testing/Procedures: None ordered  Follow-Up: Your physician wants you to follow-up in: 6 months with Dr. Gari Crown will receive a reminder letter in the mail two months in advance. If you don't receive a letter, please call our office to schedule the follow-up appointment.  Remote monitoring is used to monitor your  ICD from home. This monitoring reduces the number of office visits required to check your device to one time per year. It allows [place] to keep an eye on the functioning of your device to ensure it is working properly. You are scheduled for a device check from home on 10/02/2017. You may send your transmission at any time that day. If you have a wireless device, the transmission will be sent automatically. After your physician reviews your transmission, you will receive a postcard with your next transmission date.    Thank you for choosing [facility]!!   Frederik Schmidt, RN [phone]  Any Other Special Instructions Will Be Listed Below (If Applicable).

## 2017-08-21 LAB — CUP PACEART INCLINIC DEVICE CHECK
Battery Voltage: 2.93 V
Brady Statistic AP VP Percent: 0.14 %
Brady Statistic AS VP Percent: 0 %
Brady Statistic RA Percent Paced: 97.34 %
Date Time Interrogation Session: 20181016183622
HIGH POWER IMPEDANCE MEASURED VALUE: 35 Ohm
HIGH POWER IMPEDANCE MEASURED VALUE: 49 Ohm
Implantable Lead Implant Date: 20140801
Implantable Lead Location: 753859
Implantable Lead Model: 5592
Implantable Pulse Generator Implant Date: 20140721
Lead Channel Impedance Value: 456 Ohm
Lead Channel Impedance Value: 589 Ohm
Lead Channel Pacing Threshold Amplitude: 1 V
Lead Channel Sensing Intrinsic Amplitude: 2.125 mV
Lead Channel Setting Pacing Amplitude: 2 V
Lead Channel Setting Pacing Amplitude: 2 V
Lead Channel Setting Pacing Pulse Width: 0.4 ms
Lead Channel Setting Sensing Sensitivity: 0.3 mV
MDC IDC LEAD IMPLANT DT: 20030714
MDC IDC LEAD LOCATION: 753860
MDC IDC MSMT BATTERY REMAINING LONGEVITY: 48 mo
MDC IDC MSMT LEADCHNL RA IMPEDANCE VALUE: 399 Ohm
MDC IDC MSMT LEADCHNL RA PACING THRESHOLD AMPLITUDE: 1 V
MDC IDC MSMT LEADCHNL RA PACING THRESHOLD PULSEWIDTH: 0.4 ms
MDC IDC MSMT LEADCHNL RA SENSING INTR AMPL: 1.5 mV
MDC IDC MSMT LEADCHNL RA SENSING INTR AMPL: 1.5 mV
MDC IDC MSMT LEADCHNL RV PACING THRESHOLD PULSEWIDTH: 0.4 ms
MDC IDC MSMT LEADCHNL RV SENSING INTR AMPL: 2.5 mV
MDC IDC STAT BRADY AP VS PERCENT: 97.54 %
MDC IDC STAT BRADY AS VS PERCENT: 2.32 %
MDC IDC STAT BRADY RV PERCENT PACED: 0.17 %

## 2017-08-22 ENCOUNTER — Other Ambulatory Visit (HOSPITAL_COMMUNITY): Payer: Self-pay | Admitting: *Deleted

## 2017-08-22 DIAGNOSIS — Z95811 Presence of heart assist device: Secondary | ICD-10-CM

## 2017-08-22 DIAGNOSIS — Z7901 Long term (current) use of anticoagulants: Secondary | ICD-10-CM

## 2017-08-22 MED ORDER — WARFARIN SODIUM 2 MG PO TABS: 2.0000 mg | ORAL_TABLET | Freq: Every day | ORAL | 3 refills | Status: DC

## 2017-08-23 ENCOUNTER — Ambulatory Visit (HOSPITAL_COMMUNITY): Payer: Self-pay | Admitting: Pharmacist

## 2017-08-23 LAB — POCT INR: INR: 2

## 2017-08-26 ENCOUNTER — Other Ambulatory Visit (HOSPITAL_COMMUNITY): Payer: Self-pay | Admitting: *Deleted

## 2017-08-26 DIAGNOSIS — I5043 Acute on chronic combined systolic (congestive) and diastolic (congestive) heart failure: Secondary | ICD-10-CM

## 2017-08-26 DIAGNOSIS — Z7901 Long term (current) use of anticoagulants: Secondary | ICD-10-CM

## 2017-08-26 DIAGNOSIS — Z95811 Presence of heart assist device: Secondary | ICD-10-CM

## 2017-08-27 ENCOUNTER — Ambulatory Visit (HOSPITAL_COMMUNITY)
Admission: RE | Admit: 2017-08-27 | Discharge: 2017-08-27 | Disposition: A | Discharge status: Home / Self Care | Payer: Medicare Other | Source: Ambulatory Visit | Attending: Cardiology | Admitting: Cardiology

## 2017-08-27 ENCOUNTER — Ambulatory Visit (HOSPITAL_COMMUNITY): Payer: Self-pay | Admitting: Pharmacist

## 2017-08-27 ENCOUNTER — Encounter (HOSPITAL_COMMUNITY): Payer: Self-pay

## 2017-08-27 VITALS — BP 90/63 | HR 62 | Resp 16 | Ht 72.0 in | Wt 207.4 lb

## 2017-08-27 DIAGNOSIS — N183 Chronic kidney disease, stage 3 (moderate): Secondary | ICD-10-CM | POA: Diagnosis not present

## 2017-08-27 DIAGNOSIS — E0580 Other thyrotoxicosis without thyrotoxic crisis or storm: Secondary | ICD-10-CM | POA: Diagnosis not present

## 2017-08-27 DIAGNOSIS — M4856XA Collapsed vertebra, not elsewhere classified, lumbar region, initial encounter for fracture: Secondary | ICD-10-CM | POA: Insufficient documentation

## 2017-08-27 DIAGNOSIS — Z9889 Other specified postprocedural states: Secondary | ICD-10-CM | POA: Insufficient documentation

## 2017-08-27 DIAGNOSIS — I2510 Atherosclerotic heart disease of native coronary artery without angina pectoris: Secondary | ICD-10-CM | POA: Diagnosis not present

## 2017-08-27 DIAGNOSIS — I472 Ventricular tachycardia, unspecified: Secondary | ICD-10-CM

## 2017-08-27 DIAGNOSIS — I739 Peripheral vascular disease, unspecified: Secondary | ICD-10-CM | POA: Diagnosis not present

## 2017-08-27 DIAGNOSIS — Z7901 Long term (current) use of anticoagulants: Secondary | ICD-10-CM | POA: Insufficient documentation

## 2017-08-27 DIAGNOSIS — I255 Ischemic cardiomyopathy: Secondary | ICD-10-CM | POA: Insufficient documentation

## 2017-08-27 DIAGNOSIS — I480 Paroxysmal atrial fibrillation: Secondary | ICD-10-CM | POA: Diagnosis not present

## 2017-08-27 DIAGNOSIS — I4729 Other ventricular tachycardia: Secondary | ICD-10-CM

## 2017-08-27 DIAGNOSIS — I130 Hypertensive heart and chronic kidney disease with heart failure and stage 1 through stage 4 chronic kidney disease, or unspecified chronic kidney disease: Secondary | ICD-10-CM | POA: Diagnosis not present

## 2017-08-27 DIAGNOSIS — Z79899 Other long term (current) drug therapy: Secondary | ICD-10-CM | POA: Insufficient documentation

## 2017-08-27 DIAGNOSIS — E785 Hyperlipidemia, unspecified: Secondary | ICD-10-CM | POA: Insufficient documentation

## 2017-08-27 DIAGNOSIS — I5022 Chronic systolic (congestive) heart failure: Secondary | ICD-10-CM

## 2017-08-27 DIAGNOSIS — Z95811 Presence of heart assist device: Secondary | ICD-10-CM | POA: Diagnosis not present

## 2017-08-27 DIAGNOSIS — I428 Other cardiomyopathies: Secondary | ICD-10-CM | POA: Insufficient documentation

## 2017-08-27 DIAGNOSIS — I471 Supraventricular tachycardia: Secondary | ICD-10-CM | POA: Insufficient documentation

## 2017-08-27 DIAGNOSIS — Z7902 Long term (current) use of antithrombotics/antiplatelets: Secondary | ICD-10-CM | POA: Insufficient documentation

## 2017-08-27 DIAGNOSIS — I5043 Acute on chronic combined systolic (congestive) and diastolic (congestive) heart failure: Secondary | ICD-10-CM | POA: Diagnosis not present

## 2017-08-27 DIAGNOSIS — Z8673 Personal history of transient ischemic attack (TIA), and cerebral infarction without residual deficits: Secondary | ICD-10-CM | POA: Insufficient documentation

## 2017-08-27 DIAGNOSIS — I252 Old myocardial infarction: Secondary | ICD-10-CM | POA: Diagnosis not present

## 2017-08-27 DIAGNOSIS — G4730 Sleep apnea, unspecified: Secondary | ICD-10-CM | POA: Diagnosis not present

## 2017-08-27 LAB — BASIC METABOLIC PANEL
Anion gap: 7 (ref 5–15)
BUN: 32 mg/dL — AB (ref 6–20)
CALCIUM: 9.2 mg/dL (ref 8.9–10.3)
CO2: 22 mmol/L (ref 22–32)
Chloride: 105 mmol/L (ref 101–111)
Creatinine, Ser: 1.43 mg/dL — ABNORMAL HIGH (ref 0.61–1.24)
GFR calc Af Amer: 53 mL/min — ABNORMAL LOW (ref >60)
GFR, EST NON AFRICAN AMERICAN: 46 mL/min — AB (ref >60)
GLUCOSE: 91 mg/dL (ref 65–99)
POTASSIUM: 4.4 mmol/L (ref 3.5–5.1)
Sodium: 134 mmol/L — ABNORMAL LOW (ref 135–145)

## 2017-08-27 LAB — CBC
HCT: 35.5 % — ABNORMAL LOW (ref 39.0–52.0)
Hemoglobin: 11.4 g/dL — ABNORMAL LOW (ref 13.0–17.0)
MCH: 30.6 pg (ref 26.0–34.0)
MCHC: 32.1 g/dL (ref 30.0–36.0)
MCV: 95.4 fL (ref 78.0–100.0)
Platelets: 177 10*3/uL (ref 150–400)
RBC: 3.72 MIL/uL — ABNORMAL LOW (ref 4.22–5.81)
RDW: 16.8 % — AB (ref 11.5–15.5)
WBC: 7 10*3/uL (ref 4.0–10.5)

## 2017-08-27 LAB — LACTATE DEHYDROGENASE: LDH: 351 U/L — AB (ref 98–192)

## 2017-08-27 LAB — PROTIME-INR
INR: 2.37
Prothrombin Time: 25.7 seconds — ABNORMAL HIGH (ref 11.4–15.2)

## 2017-08-27 MED ORDER — AMIODARONE HCL 400 MG PO TABS: 200.0000 mg | ORAL_TABLET | Freq: Two times a day (BID) | ORAL | 6 refills | Status: DC

## 2017-08-27 NOTE — Patient Instructions (Signed)
Return to clinic as scheduled.  Bring equipment for annual maintenance.

## 2017-08-27 NOTE — Progress Notes (Signed)
Patient presents for hospital discharge  follow up in [facility] today. Reports no problems with VAD equipment or concerns with drive line.  Vital Signs:  Doppler Pressure 70   Automatc BP: 90/63 (73) HR:62. EKG obtained showing NSR. SPO2:97  %  Weight: 207.4 lb w/o eqt Last weight: 205.6 lb Home weights: 195-200 lbs   VAD Indication: Destination Therapy age excluding    VAD interrogation & Equipment Management: Speed:8800 Flow: 3.8 Power:4.5 w    PI:6.9  Alarms: no clinical alarms Events: none  Fixed speed 8800 Low speed limit: 8200  Primary Controller:  Replace back up battery in 77months. Back up controller:   Replace back up battery in 11 months.  Annual Equipment Maintenance on UBC/PM is due at NEXT VISIT.   I reviewed the LVAD parameters from today and compared the results to the patient's prior recorded data. LVAD interrogation was NEGATIVE for significant power changes, NEGATIVE for clinical alarms and STABLE for PI events/speed drops. No programming changes were made and pump is functioning within specified parameters. Pt is performing daily controller and system monitor self tests along with completing weekly and monthly maintenance for LVAD equipment.  LVAD equipment check completed and is in good working order. Back-up equipment present. Charged back up battery and performed self-test on equipment.   Exit Site Care: Drive line is being maintained weekly  by [name]. Drive line exit site well healed and incorporated. The velour is fully implanted at exit site. Dressing dry and intact. No erythema or drainage. Stabilization device present and accurately applied. Pt denies fever or chills. Pt states they have adequate dressing supplies at home.   Significant Events on VAD Support:  02/2015 >>GIB (removed ASA, 2-2.5 INR) 10/2015 >>CVA(Plavix started) 02/2017> GIB (removed ASA, scopes negative) 03/2017> percutaneous lead repair 08/2017>episode VT> DCCV in  ED  Device:Medtronic Therapies: on, monitor on slow VT 111; Therapy on at 200 BPM Last check: 08/21/2017   BP & Labs:  MAP 70 - Doppler is reflecting MAP  Hgb 11.4 - No S/S of bleeding. Specifically denies melena/BRBPR or nosebleeds.  LDH stable at 351 with established baseline of 250- 350. Denies tea-colored urine. No power elevations noted on interrogation. Reviewed with Dr Haroldine Laws. Patient recently underwent Cardioversion.  Plan: 1. RTC in 1 month. Plan for annual maintenance. 2. Check TSH, T4, T3 at next visit per Dr Caryl Comes 3. Reduce Amio to 200 mg twice a day. 4. POCT Inr checked in clinic today which was 2.4. Lab result is 2.37. 5. Please send in refills for 90 days in the future.    Balinda Quails RN [place] Coordinator   Office: [phone] 24/7 Emergency VAD Pager: [phone]

## 2017-08-31 NOTE — Progress Notes (Signed)
Patient ID: [name], male   DOB: 02-14-1941, 76 y.o.   MRN: [id number]   VAD CLINIC PROGRESS NOTE  PCP: Dr. Kandice Robinsons ([place]) GU: Dr Risa Grill  Neuro Surgeon: Dr Vertell Limber Vascular: Dr Kellie Simmering Primary HF: Dr Haroldine Laws  [name] is a 76 yo male with h/o VT, PAD and severe systolic HF (EF 78-29%) due to mixed ischemic/nonischemic CM he is s/p HM II LVAD implant for destination therapy on October 19, 2013.VAD implantation was complicated by VT, syncope, AF/Aflutter and RHF. Required short term milrinone.    GU Event 09/01/14 had impacted ureteral stone and underwent cystoscopy and flexible ureteroscopy with lithotripsy and basket extraction.   09/2015 Hematuria and chronic nephrolithiasis. Renal US showed mild hyronephrosis and bilateral renal cysts. Dr. Risa Grill took back for repeat lithotripsy and stone extraction by ureteroscopy and J stent placement.  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  4/2-18 - Episode of melena. Hgb 10.9-> 8.7. 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  01/2016 R CEA. He was started on Plavix.  01/2016 Lumbar fusion L3-L5 with pedicle screws posterolateral arthrodesis with BMP, allograft  Admitted 9/17 with high fevers and productive cough. CXR negative. Treated with levaquin x 7 days for acute bronchitis. Also found to have VT and underwent DC-CV. Seen by EP and amio increased  Had ramp echo in 10/17 speed turned down to 8800 due to RV failure.  Admitted 4/18 with melena and drop in Hgb. Transfused 2u. EGD/colon negative.  Admitted 5/18 with pump stops. 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  Admitted 10/13-15/18 with slow VT and ADHF.Cardioverted in ED and amio uptitrated. Also diuresed. Saw Dr. Caryl Comes earlier this week and amio decreased.   Follow up LVAD Clinic: Doing well. No further VT or ICD shocks. Denies SOB, orthopnea or PND, Able  to do all his activities without too much difficulty. No bleeding, fevers/chills or problems with driveline. No problems with driveline.   Vital Signs:  Doppler Pressure 70                Automatc BP: 90/63 (73) HR:62. EKG obtained showing NSR. SPO2:97  %  Weight: 207.4 lb w/o eqt Last weight: 205.6 lb Home weights: 195-200 lbs   VAD Indication: Destination Therapy age excluding    VAD interrogation & Equipment Management: Speed:8800 Flow: 3.8 Power:4.5 w PI:6.9  Alarms: no clinical alarms Events: none  Fixed speed 8800 Low speed limit: 8200  Primary Controller: Replace back up battery in 1months. Back up controller: Replace back up battery in 77months.  Past Medical History:  Diagnosis Date  . Arthritis   . Automatic implantable cardioverter-defibrillator in situ    a. s/p Medtronic Evera device serial [id number]    . Bradycardia   . CAD (coronary artery disease)    a. s/p MI 1982;  s/p DES to CFX 2011;  s/p NSTEMI 4/12 in setting of VTach;  cath 7/12:  LM ok, LAD mid 30-40%, Dx's with 40%, mCFX stent patent, prox to mid RCA occluded with R to R and L to R collats.  Medical Tx was continued  . Chronic systolic heart failure ([place])    a. s/p LVAD 10/2013 for destination therapy  . CKD (chronic kidney disease)   . CVD (cerebrovascular disease)    a. s/p L CEA 2008  . Cytopenia   . Dysrhythmia    AFIB  .  History of GI bleed    a. on ASA- started on plavix during 10/2015 admission for CVA  . HLD (hyperlipidemia)   . HTN (hypertension)   . Hypothyroidism   . Inappropriate therapy from implantable cardioverter-defibrillator    a. ATP for sinus tach SVT wavelet identified SVT but was no  passive (2014)  . Ischemic cardiomyopathy   . Melanoma of ear ([place])    a. L Ear   . Myocardial infarction ([place])    1992  . PAF (paroxysmal atrial fibrillation) ([place])   . PVD (peripheral vascular disease) ([place])    a. h/o claudication;  s/p R fem to pop BPG 3/11;   s/p L CFA BPG 7/03;  s/p repair of inf AAA 6/03;  s/p aorta to bilat renal BPG 6/03  . Shortness of breath dyspnea    W/ EXERTION   . Sleep apnea    NO CPAP NOW  HE SENT IT BACK YRS AGO  . Stroke [facility])    a. 10/2015 admission   . Ventricular tachycardia ([place])    a. s/p AICD;   h/o ICD shock;  Amiodarone Rx. S/P ablation in 2012    Current Outpatient Prescriptions  Medication Sig Dispense Refill  . amiodarone (PACERONE) 400 MG tablet Take 0.5 tablets (200 mg total) by mouth 2 (two) times daily. 60 tablet 6  . atorvastatin (LIPITOR) 80 MG tablet TAKE 1 TABLET DAILY AT 6 PM (Patient taking differently: TAKE 1 TABLET (80 mg) DAILY in the morning) 30 tablet 11  . clopidogrel (PLAVIX) 75 MG tablet Take 1 tablet (75 mg total) by mouth daily. 90 tablet 3  . docusate sodium (COLACE) 100 MG capsule Take 200 mg by mouth 2 (two) times daily.     [name] levothyroxine (SYNTHROID, LEVOTHROID) 200 MCG tablet Take 200 mcg (1 tab) by mouth daily except take 300 mcg (1 and 1/2 tab) by mouth every Monday.    . metoprolol succinate (TOPROL-XL) 25 MG 24 hr tablet Take 1 tablet (25 mg total) by mouth 2 (two) times daily. 180 tablet 3  . Multiple Vitamins-Minerals (ICAPS PO) Take 1 tablet by mouth 2 (two) times daily.    . nitroGLYCERIN (NITROSTAT) 0.4 MG SL tablet Place 0.4 mg under the tongue every 5 (five) minutes as needed for chest pain.    . pantoprazole (PROTONIX) 40 MG tablet Take 1 tablet (40 mg total) by mouth 2 (two) times daily. 180 tablet 6  . ranolazine (RANEXA) 500 MG 12 hr tablet Take 1 tablet (500 mg total) by mouth 2 (two) times daily. 60 tablet 6  . warfarin (COUMADIN) 2 MG tablet Take 1-2 tablets (2-4 mg total) by mouth daily. Take 1 tablet (2 mg) Mon, Wed, Fri; take 2 tablets (4mg ) Tues, Thur, Sat, Sun 180 tablet 3  . zinc gluconate 50 MG tablet Take 50 mg by mouth 2 (two) times daily.      No current facility-administered medications for this encounter.     Demerol [meperidine]  REVIEW OF  SYSTEMS: All systems negative except as listed in HPI, PMH and Problem list.  Vital Signs:  Doppler Pressure 92    Automatc BP: 94/68 (83) HR:77  SPO2:97  %  Weight: 207 lb w/o eqt Last weight: 201 lb Home weights: 200 lbs   Physical Exam: General:  NAD.  HEENT: normal  Neck: supple. JVP not elevated.  Carotids 2+ bilat; no bruits. No lymphadenopathy or thryomegaly appreciated. Cor: LVAD hum.  Lungs: Clear. Abdomen: obese soft, nontender, non-distended. No  hepatosplenomegaly. No bruits or masses. Good bowel sounds. Driveline site clean. Anchor in place.  Extremities: no cyanosis, clubbing, rash. Warm no edema  Neuro: alert & oriented x 3. No focal deficits. Moves all 4 without problem   ECG: A-paced 61 .   ASSESSMENT AND PLAN:  1. Chronic systolic HF: s/p HM-II LVAD 10/2014.  Remains NYHA II.   - Doing well. Chronic  NYHA II. Volume status ok. Continue prn lasix - LVAD speed reduced to 8800 in 10/17 due to RV failure. VAD parameters ok (Reviewed personally). Flows on low end but no formal alarms - s/p previous driveline repair. No further pump stops 2.  Atrial arrhythmias: Atrial fibrillation, atrial tachycardia.  S/p DC-CV 06/04/14, DC-CV 03/02/15 and 01/19/16.  - Has been in NSR. Continue amio. Watch for recurrent thyroid issues. 3. HTN:  --Blood pressure well controlled. Continue current regimen. 4. LVAD:  --LVAD parameters reviewed personally. Flows on low end but ok. No power spikes.   5. CKD, stage III:   --Stable. Creatinine stable today 1.43 6. Anticoagulation management:  --Goal INR 2-2.5  He is off ASA with GI bleeding. Continue Plavix.  --Uses home INR monitor. INR 2.37 here today.. Discussed with PharmD in clinic --[place] [facility] in setting of recent DC-CV. No power spikes or dark urine.  7. H/o GIB:  --s/p clipping of 2 jejunal AVMs in 4/16. Continue coumadin and plavix.(with h/o CVA). --Admitted 4/18 with recurrent GIB. Transfused 2u. EGD/colon negative --HGb  stable at  11.4 today. No current bleeding. Continue PPI      8. Lumbar compression fracture:  s/p L3-L5 fusion 02/03/16. --back pain resolved 9. 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  Continue atorvastatin. (also warfarin INR goal 2-2.5). S/p R CEA 01/13/16. Continue  plavix.  41. H/o amio-induced hypethyroidism:  --Followed by Dr. Forde Dandy in past.  --Amio restarted due to recurrent atrial arrhythmias and VT.  --Recent TSH ok.  --Continue to follow 12. VT  - underwent DC-CV 9/17.  - repeat episode of slow VT 08/17/17. Requiring DC-CV. Amio increased for 1 week  To 400 bid. Now on 200 bid. Has seen Dr. Caryl Comes. Can decrease to 200 bid in several weeks - Keep K > 4.0 Mg > 2.0  Bensimhon, Daniel,MD 9:54 PM

## 2017-09-03 ENCOUNTER — Ambulatory Visit (HOSPITAL_COMMUNITY): Payer: Self-pay | Admitting: Pharmacist

## 2017-09-03 LAB — POCT INR: INR: 3.1

## 2017-09-09 ENCOUNTER — Telehealth (HOSPITAL_COMMUNITY): Payer: Self-pay | Admitting: Unknown Physician Specialty

## 2017-09-09 DIAGNOSIS — I480 Paroxysmal atrial fibrillation: Secondary | ICD-10-CM

## 2017-09-09 DIAGNOSIS — I6389 Other cerebral infarction: Secondary | ICD-10-CM

## 2017-09-09 MED ORDER — CLOPIDOGREL BISULFATE 75 MG PO TABS: 75.0000 mg | ORAL_TABLET | Freq: Every day | ORAL | 6 refills | Status: DC

## 2017-09-09 NOTE — Telephone Encounter (Signed)
Caregiver called to request refill for Plavix. Script sent to [facility].

## 2017-09-10 ENCOUNTER — Ambulatory Visit (HOSPITAL_COMMUNITY): Payer: Self-pay | Admitting: Pharmacist

## 2017-09-10 LAB — POCT INR: INR: 2.5

## 2017-09-18 ENCOUNTER — Ambulatory Visit (HOSPITAL_COMMUNITY): Payer: Self-pay | Admitting: Pharmacist

## 2017-09-18 LAB — POCT INR: INR: 2.3

## 2017-09-20 ENCOUNTER — Other Ambulatory Visit (HOSPITAL_COMMUNITY): Payer: Self-pay | Admitting: Unknown Physician Specialty

## 2017-09-20 DIAGNOSIS — I5022 Chronic systolic (congestive) heart failure: Secondary | ICD-10-CM

## 2017-09-20 DIAGNOSIS — E039 Hypothyroidism, unspecified: Secondary | ICD-10-CM

## 2017-09-20 DIAGNOSIS — Z7901 Long term (current) use of anticoagulants: Secondary | ICD-10-CM

## 2017-09-20 DIAGNOSIS — Z95811 Presence of heart assist device: Secondary | ICD-10-CM

## 2017-09-23 ENCOUNTER — Ambulatory Visit (HOSPITAL_COMMUNITY): Payer: Self-pay | Admitting: Pharmacist

## 2017-09-23 ENCOUNTER — Other Ambulatory Visit (HOSPITAL_COMMUNITY): Payer: Self-pay | Admitting: Unknown Physician Specialty

## 2017-09-23 ENCOUNTER — Ambulatory Visit (HOSPITAL_COMMUNITY)
Admission: RE | Admit: 2017-09-23 | Discharge: 2017-09-23 | Disposition: A | Discharge status: Home / Self Care | Payer: Medicare Other | Source: Ambulatory Visit | Attending: Cardiology | Admitting: Cardiology

## 2017-09-23 DIAGNOSIS — I252 Old myocardial infarction: Secondary | ICD-10-CM | POA: Diagnosis not present

## 2017-09-23 DIAGNOSIS — M4856XA Collapsed vertebra, not elsewhere classified, lumbar region, initial encounter for fracture: Secondary | ICD-10-CM | POA: Insufficient documentation

## 2017-09-23 DIAGNOSIS — Z95811 Presence of heart assist device: Secondary | ICD-10-CM

## 2017-09-23 DIAGNOSIS — I472 Ventricular tachycardia, unspecified: Secondary | ICD-10-CM

## 2017-09-23 DIAGNOSIS — I130 Hypertensive heart and chronic kidney disease with heart failure and stage 1 through stage 4 chronic kidney disease, or unspecified chronic kidney disease: Secondary | ICD-10-CM | POA: Insufficient documentation

## 2017-09-23 DIAGNOSIS — Z79899 Other long term (current) drug therapy: Secondary | ICD-10-CM | POA: Insufficient documentation

## 2017-09-23 DIAGNOSIS — E785 Hyperlipidemia, unspecified: Secondary | ICD-10-CM | POA: Diagnosis not present

## 2017-09-23 DIAGNOSIS — I2510 Atherosclerotic heart disease of native coronary artery without angina pectoris: Secondary | ICD-10-CM | POA: Diagnosis not present

## 2017-09-23 DIAGNOSIS — E039 Hypothyroidism, unspecified: Secondary | ICD-10-CM | POA: Diagnosis not present

## 2017-09-23 DIAGNOSIS — R7402 Elevation of levels of lactic acid dehydrogenase (LDH): Secondary | ICD-10-CM

## 2017-09-23 DIAGNOSIS — Z7901 Long term (current) use of anticoagulants: Secondary | ICD-10-CM | POA: Diagnosis not present

## 2017-09-23 DIAGNOSIS — G4730 Sleep apnea, unspecified: Secondary | ICD-10-CM | POA: Diagnosis not present

## 2017-09-23 DIAGNOSIS — N183 Chronic kidney disease, stage 3 (moderate): Secondary | ICD-10-CM | POA: Insufficient documentation

## 2017-09-23 DIAGNOSIS — I739 Peripheral vascular disease, unspecified: Secondary | ICD-10-CM | POA: Diagnosis not present

## 2017-09-23 DIAGNOSIS — I255 Ischemic cardiomyopathy: Secondary | ICD-10-CM | POA: Diagnosis not present

## 2017-09-23 DIAGNOSIS — Z7902 Long term (current) use of antithrombotics/antiplatelets: Secondary | ICD-10-CM | POA: Diagnosis not present

## 2017-09-23 DIAGNOSIS — Z8673 Personal history of transient ischemic attack (TIA), and cerebral infarction without residual deficits: Secondary | ICD-10-CM | POA: Diagnosis not present

## 2017-09-23 DIAGNOSIS — I5022 Chronic systolic (congestive) heart failure: Secondary | ICD-10-CM | POA: Diagnosis not present

## 2017-09-23 DIAGNOSIS — I480 Paroxysmal atrial fibrillation: Secondary | ICD-10-CM | POA: Insufficient documentation

## 2017-09-23 DIAGNOSIS — I471 Supraventricular tachycardia: Secondary | ICD-10-CM | POA: Diagnosis not present

## 2017-09-23 DIAGNOSIS — R740 Nonspecific elevation of levels of transaminase and lactic acid dehydrogenase [LDH]: Secondary | ICD-10-CM

## 2017-09-23 LAB — COMPREHENSIVE METABOLIC PANEL
ALBUMIN: 3.9 g/dL (ref 3.5–5.0)
ALK PHOS: 134 U/L — AB (ref 38–126)
ALT: 18 U/L (ref 17–63)
AST: 34 U/L (ref 15–41)
Anion gap: 7 (ref 5–15)
BILIRUBIN TOTAL: 0.8 mg/dL (ref 0.3–1.2)
BUN: 38 mg/dL — AB (ref 6–20)
CALCIUM: 8.9 mg/dL (ref 8.9–10.3)
CO2: 23 mmol/L (ref 22–32)
CREATININE: 1.39 mg/dL — AB (ref 0.61–1.24)
Chloride: 104 mmol/L (ref 101–111)
GFR calc Af Amer: 55 mL/min — ABNORMAL LOW (ref >60)
GFR calc non Af Amer: 47 mL/min — ABNORMAL LOW (ref >60)
GLUCOSE: 90 mg/dL (ref 65–99)
Potassium: 4.5 mmol/L (ref 3.5–5.1)
SODIUM: 134 mmol/L — AB (ref 135–145)
Total Protein: 7.2 g/dL (ref 6.5–8.1)

## 2017-09-23 LAB — TSH: TSH: 1.931 u[IU]/mL (ref 0.350–4.500)

## 2017-09-23 LAB — PROTIME-INR
INR: 2.37
Prothrombin Time: 25.7 seconds — ABNORMAL HIGH (ref 11.4–15.2)

## 2017-09-23 LAB — T4, FREE: Free T4: 1.51 ng/dL — ABNORMAL HIGH (ref 0.61–1.12)

## 2017-09-23 LAB — PREALBUMIN: Prealbumin: 21.5 mg/dL (ref 18–38)

## 2017-09-23 LAB — LACTATE DEHYDROGENASE: LDH: 382 U/L — ABNORMAL HIGH (ref 98–192)

## 2017-09-23 NOTE — Progress Notes (Signed)
Patient presents for 2 month follow up in [facility] today. Reports no problems with VAD equipment or concerns with drive line.  Vital Signs:  Doppler Pressure: 106   Automatc BP: 94/78 (81) HR: 64  SPO2: 97  %  Weight: 204.8 lb w/o eqt Last weight: 207.4 lb Home weights: 205-210 lbs   VAD Indication: Destination Therapy- age excluding  VAD interrogation & Equipment Management: Speed: 8800 Flow: 3.3 Power: 4.2 w    PI: 7.4  Alarms: no clinical alarms Events: 10-20 PI events daily  Fixed speed 8800 Low speed limit: 8200  Primary Controller:  Replace back up battery in 10 months. Back up controller:   Replace back up battery in 10 months.  Annual Equipment Maintenance on UBC/PM was performed on 09/23/17.   I reviewed the LVAD parameters from today and compared the results to the patient's prior recorded data. LVAD interrogation was NEGATIVE for significant power changes, NEGATIVE for clinical alarms and STABLE for PI events/speed drops. No programming changes were made and pump is functioning within specified parameters. Pt is performing daily controller and system monitor self tests along with completing weekly and monthly maintenance for LVAD equipment.  LVAD equipment check completed and is in good working order. Back-up equipment present. Charged back up battery and performed self-test on equipment.   Exit Site Care: Drive line is being maintained weekly  by [name]. Drive line exit site well healed and incorporated. The velour is fully implanted at exit site. Dressing dry and intact. No erythema or drainage. Stabilization device present and accurately applied. Pt denies fever or chills. 4 weekly dressing kits given.  Significant Events on VAD Support:  02/2015 >>GIB (removed ASA, 2-2.5 INR) 10/2015 >>CVA(Plavix started) 02/2017> GIB (removed ASA, scopes negative) 03/2017> percutaneous lead repair  Device:Medtronic Therapies: on Last check: followed at  device clinic, Dr Caryl Comes   BP & Labs:  MAP 81 - Doppler is reflecting MAP  Hgb 11.8 - No S/S of bleeding. Specifically denies melena/BRBPR or nosebleeds.  LDH at  382 with established baseline of 250- 300. Denies tea-colored urine. No power elevations noted on interrogation.   Reviewed and demonstrated the following to patient and caregiver:               Reviewed the steps for replacing the running system controller with the back-up system controller (see patient handbook section 2 or the appropriate pamphlet).             X  Demonstrated (using the mock-driveline and controller) how to connect and disconnect the mock-driveline in back up system controller in a timely manner (less than 10 seconds) with return demonstration by patient and caregiver.              X   Reviewed system controller alarms and troubleshooting including hazard and advisory alarms and accessing alarm history. Re-enforced NOT TO attempt to perform any task displayed on the display screen alone or without calling the VAD pager [phone] (see patient handbook section 5 or the appropriate pamphlet).                       X  Reviewed how to handle an emergency including when the pump is running and when the pump has stopped (see patient handbook section 8). Call 911 first and then the VAD pager at [phone].             X   Reviewed 14-volt lithium ion battery calibration steps (see  patient handbook section 3).            X   Reviewed contents of black bag and what must be with patient at all times.            X  Reviewed driveline exit site including cleansing, dressing, and immobilizing with an anchor device to prevent exit site trauma.             X  Reviewed weekly maintenance which includes: Reviewing "Replacing the [place] with a [employer]" pamphlet. Clean batteries, clips, and battery charger contacts.  Check cables for damages. Rotate batteries; keep all 8 charged.               X  Reviewed monthly maintenance which includes: Reviewing Alarms and Troubleshooting. Check battery manufacturer dates. Check use/charge cycles for each battery; remember to re-calibrate every 70 uses when prompted.              X  Additional pamphlets Guide to Replacing the [place] with the [place] for Patients and Their Caregivers and HM II Alarms for Patients and Their Caregivers given to patient.              X   Batteries Manufacture Date: Number of uses: Re-calibration  02/22/16 75-77 Performed by pt  02/22/16 75-77 Performed by pt  02/22/16 75-77 Performed by pt  02/22/16 75-77 Performed by pt  02/22/16 78 Performed by patient   Annual maintenance completed per Marcie Mowers on patient's home power module and universal Charity fundraiser.    Backup system controller 11 volt battery charged during visit.   4 year Intermacs follow up completed including:  Quality of Life, KCCQ-12, and Neurocognitive trail making (75min 56 secs).   Pt completed 400 feet during 6 minute walk but had to stop after 2 min and 30 secs.    Plan: 1.Return to clinic in 2 months with yearly echo.  2. Repeat LDH, and have CBC drawn in 2 weeks at local [place] RN [place] Coordinator   Office: [phone] 24/7 Emergency VAD Pager: [phone]

## 2017-09-23 NOTE — Progress Notes (Signed)
Patient ID: [name], male   DOB: 08-Jun-1941, 76 y.o.   MRN: [id number]   VAD CLINIC PROGRESS NOTE  PCP: Dr. Kandice [name] ([place]) GU: Dr [name]  Neuro Surgeon: Dr [name] Vascular: Dr [name] Primary HF: Dr [name]  [name] is a 76 yo male with h/o VT, PAD and severe systolic HF (EF 21-19%) due to mixed ischemic/nonischemic CM he is s/p HM II LVAD implant for destination therapy on October 19, 2013.VAD implantation was complicated by VT, syncope, AF/Aflutter and RHF. Required short term milrinone.    GU Event 09/01/14 had impacted ureteral stone and underwent cystoscopy and flexible ureteroscopy with lithotripsy and basket extraction.   09/2015 Hematuria and chronic nephrolithiasis. Renal US showed mild hyronephrosis and bilateral renal cysts. Dr. Risa [name] took back for repeat lithotripsy and stone extraction by ureteroscopy and J stent placement.  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  4/2-18 - Episode of melena. Hgb 10.9-> 8.7. 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  01/2016 R CEA. He was started on Plavix.  01/2016 Lumbar fusion L3-L5 with pedicle screws posterolateral arthrodesis with BMP, allograft  Admitted 9/17 with high fevers and productive cough. CXR negative. Treated with levaquin x 7 days for acute bronchitis. Also found to have VT and underwent DC-CV. Seen by EP and amio increased  Had ramp echo in 10/17 speed turned down to 8800 due to RV failure.  Admitted 4/18 with melena and drop in Hgb. Transfused 2u. EGD/colon negative.  Admitted 5/18 with pump stops. 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  Admitted 10/13-15/18 with slow VT and ADHF.Cardioverted in ED and amio uptitrated. Also diuresed. Saw Dr. Caryl [name] earlier this week and amio decreased.   Follow up LVAD Clinic: He returns for f/u. Doing well. Saw Dr. Caryl [name] and suggested AliveCor. He  has the device but has a hard time getting good tracings due to artifact. Denies palpitations or weakness. Not as active in the winter. No edema. Denies orthopnea or PND. No fevers, chills or problems with driveline. No bleeding, melena or neuro symptoms. No VAD alarms. Taking all meds as prescribed.    VAD Indication: Destination Therapy- age excluding  VAD interrogation & Equipment Management: Speed: 8800 Flow: 3.3 Power: 4.2 w PI: 7.4  Alarms: no clinical alarms Events: 10-20 PI events daily  Fixed speed 8800 Low speed limit: 8200  Primary Controller: Replace back up battery in 10 months. Back up controller: Replace back up battery in 65months.  Annual Equipment Maintenance on UBC/PM was performed on 09/23/17.  Past Medical History:  Diagnosis Date  . Arthritis   . Automatic implantable cardioverter-defibrillator in situ    a. s/p Medtronic Evera device serial [id number]    . Bradycardia   . CAD (coronary artery disease)    a. s/p MI 1982;  s/p DES to CFX 2011;  s/p NSTEMI 4/12 in setting of VTach;  cath 7/12:  LM ok, LAD mid 30-40%, Dx's with 40%, mCFX stent patent, prox to mid RCA occluded with R to R and L to R collats.  Medical Tx was continued  . Chronic systolic heart failure ([place])    a. s/p LVAD 10/2013 for destination therapy  . CKD (chronic kidney disease)   . CVD (cerebrovascular disease)    a. s/p L CEA 2008  . Cytopenia   . Dysrhythmia    AFIB  . History of GI  bleed    a. on ASA- started on plavix during 10/2015 admission for CVA  . HLD (hyperlipidemia)   . HTN (hypertension)   . Hypothyroidism   . Inappropriate therapy from implantable cardioverter-defibrillator    a. ATP for sinus tach SVT wavelet identified SVT but was no  passive (2014)  . Ischemic cardiomyopathy   . 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 L Ear   . Myocardial infarction ([place])    1992  . PAF (paroxysmal atrial fibrillation) ([place])   . PVD (peripheral vascular disease) ([place])     a. 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 Shortness of breath dyspnea    W/ EXERTION   . Sleep apnea    NO CPAP NOW  HE SENT IT BACK YRS AGO  . Stroke [facility])    a. 10/2015 admission   . Ventricular tachycardia ([place])    a. s/p AICD;   h/o ICD shock;  Amiodarone Rx. S/P ablation in 2012    Current Outpatient Medications  Medication Sig Dispense Refill  . amiodarone (PACERONE) 200 MG tablet Take 200 mg 2 (two) times daily by mouth.    [name] atorvastatin (LIPITOR) 80 MG tablet TAKE 1 TABLET DAILY AT 6 PM (Patient taking differently: TAKE 1 TABLET (80 mg) DAILY in the morning) 30 tablet 11  . clopidogrel (PLAVIX) 75 MG tablet Take 1 tablet (75 mg total) daily by mouth. 90 tablet 6  . docusate sodium (COLACE) 100 MG capsule Take 200 mg by mouth 2 (two) times daily.     [name] levothyroxine (SYNTHROID, LEVOTHROID) 200 MCG tablet Take 200 mcg (1 tab) by mouth daily except take 300 mcg (1 and 1/2 tab) by mouth every Monday.    . metoprolol succinate (TOPROL-XL) 25 MG 24 hr tablet Take 1 tablet (25 mg total) by mouth 2 (two) times daily. 180 tablet 3  . Multiple Vitamins-Minerals (ICAPS PO) Take 1 tablet by mouth 2 (two) times daily.    . pantoprazole (PROTONIX) 40 MG tablet Take 1 tablet (40 mg total) by mouth 2 (two) times daily. 180 tablet 6  . ranolazine (RANEXA) 500 MG 12 hr tablet Take 1 tablet (500 mg total) by mouth 2 (two) times daily. 60 tablet 6  . warfarin (COUMADIN) 2 MG tablet Take 2 mg (1 tablet) daily except 4 mg (2 tablets) on Tues, Thurs, Sun.    . zinc gluconate 50 MG tablet Take 50 mg by mouth 2 (two) times daily.     . nitroGLYCERIN (NITROSTAT) 0.4 MG SL tablet Place 0.4 mg under the tongue every 5 (five) minutes as needed for chest pain.     No current facility-administered medications for this encounter.     Demerol [meperidine]  REVIEW OF SYSTEMS: All systems negative except as listed in  HPI, PMH and Problem list.  Vital Signs:  Doppler Pressure: 106                         Automatc BP: 94/78 (81) HR: 64 SPO2: 97  %  Weight: 204.8 lb w/o eqt Last weight: 207.4 lb Home weights: 205-210 lbs    Physical Exam: General:  NAD.  HEENT: normal  Neck: supple. JVP 6-7 Carotids 2+ bilat; no bruits. No lymphadenopathy or thryomegaly appreciated. Cor: LVAD hum.  Lungs: Clear. Abdomen: obese soft,  nontender, non-distended. No hepatosplenomegaly. No bruits or masses. Good bowel sounds. Driveline site clean. Anchor in place.  Extremities: no cyanosis, clubbing, rash. Warm no edema  Neuro: alert & oriented x 3. No focal deficits. Moves all 4 without problem  .   ASSESSMENT AND PLAN:  1. Chronic systolic HF: s/p HM-II LVAD 10/2014.  Remains NYHA II.   - Doing well. Chronic NYHA II. Volume status looks good. Continue prn lasix - LVAD speed reduced to 8800 in 10/17 due to RV failure. VAD parameters ok (Reviewed personally).  - Flows occasionally on low end but not low flow alarms - s/p previous driveline repair. No further pump stops 2.  Atrial arrhythmias: Atrial fibrillation, atrial tachycardia.  S/p DC-CV 06/04/14, DC-CV 03/02/15 and 01/19/16.  - Alivecor device reviewed with him today.  - [place] in NSR. Continue amio. Watch for recurrent thyroid issues. 3. HTN:  - Blood pressure well controlled. Continue current regimen. 4. LVAD:  - VAD interrogated personally. Parameters stable. Flows on low end but ok. No power spikes.   5. CKD, stage III:   - Stable. Creatinine stable today 1.39 6. Anticoagulation management:  - Goal INR 2-2.5  He is off ASA with GI bleeding. Continue Plavix.  - Uses home INR monitor. INR 2.37 today. Discussed with PharmD - LDH climbing slowly 298 -> 306 -> 351 -> 382. No power spikes. Recheck next week  7. H/o GIB:  - s/p clipping of 2 jejunal AVMs in 4/16. Continue coumadin and plavix.(with h/o CVA). - Admitted 4/18 with recurrent GIB.  Transfused 2u. EGD/colon negative - No recent bleeding    8. Lumbar compression fracture:  s/p L3-L5 fusion 02/03/16. --back pain resolved 9. 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  Continue atorvastatin. (also warfarin INR goal 2-2.5). S/p R CEA 01/13/16. Continue  plavix.  64. H/o amio-induced hypethyroidism:  --Followed by Dr. Forde Dandy in past.  --Amio restarted due to recurrent atrial arrhythmias and VT.  --Recent TSH ok.  --Continue to follow 12. VT  - underwent DC-CV 9/17.  - repeat episode of slow VT 08/17/17. Requiring DC-CV. Amio increased for 1 week  To 400 bid. Now on 200 bid. Has seen Dr. Caryl [name]. Can decrease to 200 bid in several weeks - Keep K > 4.0 Mg > 2.0 - Remains in NSR on AliveCor device. Will continue to monitor  Frank Quince Kayl Stogdill,MD 3:13 PM

## 2017-09-24 ENCOUNTER — Encounter (HOSPITAL_COMMUNITY): Payer: Medicare Other

## 2017-09-24 LAB — T3: T3, Total: 48 ng/dL — ABNORMAL LOW (ref 71–180)

## 2017-09-30 ENCOUNTER — Other Ambulatory Visit: Payer: Self-pay | Admitting: Internal Medicine

## 2017-10-01 ENCOUNTER — Telehealth (HOSPITAL_COMMUNITY): Payer: Self-pay

## 2017-10-01 ENCOUNTER — Other Ambulatory Visit: Payer: Self-pay | Admitting: Internal Medicine

## 2017-10-01 ENCOUNTER — Ambulatory Visit (HOSPITAL_COMMUNITY): Payer: Self-pay | Admitting: Pharmacist

## 2017-10-01 LAB — PROTIME-INR
INR: 3.1 — AB (ref 0.8–1.2)
PROTHROMBIN TIME: 30.2 s — AB (ref 9.1–12.0)

## 2017-10-01 NOTE — Telephone Encounter (Signed)
INR 3.1 given to LVAD RN

## 2017-10-02 ENCOUNTER — Telehealth: Payer: Self-pay | Admitting: Cardiology

## 2017-10-02 ENCOUNTER — Ambulatory Visit (INDEPENDENT_AMBULATORY_CARE_PROVIDER_SITE_OTHER): Payer: Medicare Other | Admitting: *Deleted

## 2017-10-02 DIAGNOSIS — I255 Ischemic cardiomyopathy: Secondary | ICD-10-CM

## 2017-10-02 LAB — CBC/DIFF AMBIGUOUS DEFAULT
BASOS ABS: 0 10*3/uL (ref 0.0–0.2)
BASOS: 1 %
EOS (ABSOLUTE): 0.3 10*3/uL (ref 0.0–0.4)
Eos: 4 %
HEMATOCRIT: 37 % — AB (ref 37.5–51.0)
HEMOGLOBIN: 11.9 g/dL — AB (ref 13.0–17.7)
LYMPHS: 13 %
Lymphocytes Absolute: 0.9 10*3/uL (ref 0.7–3.1)
MCH: 30.5 pg (ref 26.6–33.0)
MCHC: 32.2 g/dL (ref 31.5–35.7)
MCV: 95 fL (ref 79–97)
Monocytes Absolute: 1.2 10*3/uL — ABNORMAL HIGH (ref 0.1–0.9)
Monocytes: 16 %
NEUTROS PCT: 66 %
Neutrophils Absolute: 4.8 10*3/uL (ref 1.4–7.0)
Platelets: 187 10*3/uL (ref 150–379)
RBC: 3.9 x10E6/uL — ABNORMAL LOW (ref 4.14–5.80)
RDW: 16.6 % — ABNORMAL HIGH (ref 12.3–15.4)
WBC: 7.1 10*3/uL (ref 3.4–10.8)

## 2017-10-02 LAB — LACTATE DEHYDROGENASE: LDH: 434 IU/L — ABNORMAL HIGH (ref 121–224)

## 2017-10-02 NOTE — Telephone Encounter (Signed)
Spoke with pt and reminded pt of remote transmission that is due today. Pt verbalized understanding.   

## 2017-10-03 NOTE — Progress Notes (Signed)
Remote ICD transmission.   

## 2017-10-04 ENCOUNTER — Encounter: Payer: Self-pay | Admitting: Cardiology

## 2017-10-07 ENCOUNTER — Other Ambulatory Visit (HOSPITAL_COMMUNITY): Payer: Self-pay | Admitting: *Deleted

## 2017-10-07 ENCOUNTER — Ambulatory Visit (HOSPITAL_COMMUNITY): Payer: Self-pay | Admitting: Pharmacist

## 2017-10-07 ENCOUNTER — Other Ambulatory Visit: Payer: Self-pay | Admitting: Internal Medicine

## 2017-10-07 ENCOUNTER — Ambulatory Visit (HOSPITAL_COMMUNITY)
Admission: RE | Admit: 2017-10-07 | Discharge: 2017-10-07 | Disposition: A | Discharge status: Home / Self Care | Payer: Medicare Other | Source: Ambulatory Visit | Attending: Cardiology | Admitting: Cardiology

## 2017-10-07 ENCOUNTER — Encounter (HOSPITAL_COMMUNITY): Payer: Self-pay

## 2017-10-07 VITALS — BP 103/70 | HR 64 | Resp 16 | Ht 72.0 in | Wt 201.4 lb

## 2017-10-07 DIAGNOSIS — I5022 Chronic systolic (congestive) heart failure: Secondary | ICD-10-CM | POA: Diagnosis not present

## 2017-10-07 DIAGNOSIS — Z981 Arthrodesis status: Secondary | ICD-10-CM | POA: Diagnosis not present

## 2017-10-07 DIAGNOSIS — I4581 Long QT syndrome: Secondary | ICD-10-CM | POA: Diagnosis not present

## 2017-10-07 DIAGNOSIS — I739 Peripheral vascular disease, unspecified: Secondary | ICD-10-CM | POA: Insufficient documentation

## 2017-10-07 DIAGNOSIS — Z79899 Other long term (current) drug therapy: Secondary | ICD-10-CM | POA: Diagnosis not present

## 2017-10-07 DIAGNOSIS — G4730 Sleep apnea, unspecified: Secondary | ICD-10-CM | POA: Diagnosis not present

## 2017-10-07 DIAGNOSIS — Z85820 Personal history of malignant melanoma of skin: Secondary | ICD-10-CM | POA: Diagnosis not present

## 2017-10-07 DIAGNOSIS — Z79890 Hormone replacement therapy (postmenopausal): Secondary | ICD-10-CM | POA: Insufficient documentation

## 2017-10-07 DIAGNOSIS — I472 Ventricular tachycardia, unspecified: Secondary | ICD-10-CM

## 2017-10-07 DIAGNOSIS — J111 Influenza due to unidentified influenza virus with other respiratory manifestations: Secondary | ICD-10-CM | POA: Diagnosis not present

## 2017-10-07 DIAGNOSIS — I252 Old myocardial infarction: Secondary | ICD-10-CM | POA: Insufficient documentation

## 2017-10-07 DIAGNOSIS — Z7902 Long term (current) use of antithrombotics/antiplatelets: Secondary | ICD-10-CM | POA: Insufficient documentation

## 2017-10-07 DIAGNOSIS — I130 Hypertensive heart and chronic kidney disease with heart failure and stage 1 through stage 4 chronic kidney disease, or unspecified chronic kidney disease: Secondary | ICD-10-CM | POA: Diagnosis not present

## 2017-10-07 DIAGNOSIS — I5043 Acute on chronic combined systolic (congestive) and diastolic (congestive) heart failure: Secondary | ICD-10-CM

## 2017-10-07 DIAGNOSIS — I255 Ischemic cardiomyopathy: Secondary | ICD-10-CM | POA: Diagnosis not present

## 2017-10-07 DIAGNOSIS — I2510 Atherosclerotic heart disease of native coronary artery without angina pectoris: Secondary | ICD-10-CM | POA: Diagnosis not present

## 2017-10-07 DIAGNOSIS — Z8719 Personal history of other diseases of the digestive system: Secondary | ICD-10-CM | POA: Diagnosis not present

## 2017-10-07 DIAGNOSIS — Z7901 Long term (current) use of anticoagulants: Secondary | ICD-10-CM

## 2017-10-07 DIAGNOSIS — B349 Viral infection, unspecified: Secondary | ICD-10-CM | POA: Diagnosis not present

## 2017-10-07 DIAGNOSIS — Z95811 Presence of heart assist device: Secondary | ICD-10-CM

## 2017-10-07 DIAGNOSIS — N183 Chronic kidney disease, stage 3 (moderate): Secondary | ICD-10-CM | POA: Insufficient documentation

## 2017-10-07 DIAGNOSIS — M1990 Unspecified osteoarthritis, unspecified site: Secondary | ICD-10-CM | POA: Diagnosis not present

## 2017-10-07 DIAGNOSIS — Z8673 Personal history of transient ischemic attack (TIA), and cerebral infarction without residual deficits: Secondary | ICD-10-CM | POA: Insufficient documentation

## 2017-10-07 DIAGNOSIS — I480 Paroxysmal atrial fibrillation: Secondary | ICD-10-CM | POA: Insufficient documentation

## 2017-10-07 DIAGNOSIS — E039 Hypothyroidism, unspecified: Secondary | ICD-10-CM | POA: Insufficient documentation

## 2017-10-07 DIAGNOSIS — E785 Hyperlipidemia, unspecified: Secondary | ICD-10-CM | POA: Insufficient documentation

## 2017-10-07 LAB — CUP PACEART REMOTE DEVICE CHECK
Battery Remaining Longevity: 41 mo
Brady Statistic AP VS Percent: 98 %
Brady Statistic AS VP Percent: 0.01 %
Brady Statistic AS VS Percent: 1.88 %
HIGH POWER IMPEDANCE MEASURED VALUE: 53 Ohm
HighPow Impedance: 39 Ohm
Implantable Lead Implant Date: 20030714
Implantable Lead Location: 753859
Implantable Lead Model: 5592
Implantable Lead Model: 6944
Lead Channel Impedance Value: 399 Ohm
Lead Channel Pacing Threshold Amplitude: 0.75 V
Lead Channel Pacing Threshold Amplitude: 1 V
Lead Channel Pacing Threshold Pulse Width: 0.4 ms
Lead Channel Pacing Threshold Pulse Width: 0.4 ms
Lead Channel Sensing Intrinsic Amplitude: 1.375 mV
Lead Channel Sensing Intrinsic Amplitude: 1.375 mV
Lead Channel Setting Pacing Amplitude: 2 V
Lead Channel Setting Sensing Sensitivity: 0.3 mV
MDC IDC LEAD IMPLANT DT: 20140801
MDC IDC LEAD LOCATION: 753860
MDC IDC MSMT BATTERY VOLTAGE: 2.97 V
MDC IDC MSMT LEADCHNL RV IMPEDANCE VALUE: 513 Ohm
MDC IDC MSMT LEADCHNL RV IMPEDANCE VALUE: 608 Ohm
MDC IDC MSMT LEADCHNL RV SENSING INTR AMPL: 2.75 mV
MDC IDC MSMT LEADCHNL RV SENSING INTR AMPL: 2.75 mV
MDC IDC PG IMPLANT DT: 20140721
MDC IDC SESS DTM: 20181128222618
MDC IDC SET LEADCHNL RA PACING AMPLITUDE: 2 V
MDC IDC SET LEADCHNL RV PACING PULSEWIDTH: 0.4 ms
MDC IDC STAT BRADY AP VP PERCENT: 0.11 %
MDC IDC STAT BRADY RA PERCENT PACED: 97.96 %
MDC IDC STAT BRADY RV PERCENT PACED: 0.14 %

## 2017-10-07 LAB — COMPREHENSIVE METABOLIC PANEL
ALBUMIN: 3.8 g/dL (ref 3.5–5.0)
ALK PHOS: 132 U/L — AB (ref 38–126)
ALT: 20 U/L (ref 17–63)
ANION GAP: 8 (ref 5–15)
AST: 30 U/L (ref 15–41)
BUN: 35 mg/dL — AB (ref 6–20)
CO2: 24 mmol/L (ref 22–32)
Calcium: 9.1 mg/dL (ref 8.9–10.3)
Chloride: 102 mmol/L (ref 101–111)
Creatinine, Ser: 1.42 mg/dL — ABNORMAL HIGH (ref 0.61–1.24)
GFR calc Af Amer: 53 mL/min — ABNORMAL LOW (ref >60)
GFR calc non Af Amer: 46 mL/min — ABNORMAL LOW (ref >60)
GLUCOSE: 103 mg/dL — AB (ref 65–99)
POTASSIUM: 4.3 mmol/L (ref 3.5–5.1)
SODIUM: 134 mmol/L — AB (ref 135–145)
Total Bilirubin: 0.6 mg/dL (ref 0.3–1.2)
Total Protein: 7 g/dL (ref 6.5–8.1)

## 2017-10-07 LAB — LACTATE DEHYDROGENASE: LDH: 358 U/L — ABNORMAL HIGH (ref 98–192)

## 2017-10-07 LAB — CBC
HCT: 34.6 % — ABNORMAL LOW (ref 39.0–52.0)
HEMOGLOBIN: 11.3 g/dL — AB (ref 13.0–17.0)
MCH: 30.5 pg (ref 26.0–34.0)
MCHC: 32.7 g/dL (ref 30.0–36.0)
MCV: 93.5 fL (ref 78.0–100.0)
Platelets: 179 10*3/uL (ref 150–400)
RBC: 3.7 MIL/uL — ABNORMAL LOW (ref 4.22–5.81)
RDW: 16.5 % — AB (ref 11.5–15.5)
WBC: 8.6 10*3/uL (ref 4.0–10.5)

## 2017-10-07 LAB — INFLUENZA PANEL BY PCR (TYPE A & B)
Influenza A By PCR: NEGATIVE
Influenza B By PCR: NEGATIVE

## 2017-10-07 LAB — PROTIME-INR
INR: 2.38
PROTHROMBIN TIME: 25.8 s — AB (ref 11.4–15.2)

## 2017-10-07 NOTE — Progress Notes (Signed)
Patient presents for sick visit in [facility] today. Reports no problems with VAD equipment or concerns with drive line. Feels like he is "hurting all over" and "achy". Low grade fever for 2 days. No cough or congestion.  Influenza A and B PCR swab negative.  [employer] rep at bedside to interrogate ICD. Patient is NOT in atrial fib, but is in NSR with occasional PAC's. Occasional atrial pacing noted.    Vital Signs:  Doppler Pressure 84   Automatc BP: 103/10 (82) HR:64   SPO2:98  %  Weight: 201.4 lb w/o eqt Last weight: 204.8 lb Home weights: 193-194  lbs   VAD Indication: Destination therapy - age excluding  VAD interrogation & Equipment Management: Speed:8800 Flow: 3.4 Power:4.23 w    PI:7.4  Alarms: no clinical alarms Events: 11/27- low flow, 12/3- low flow.  Fixed speed 8800 Low speed limit: 8200  Primary Controller:  Replace back up battery in 38months. Back up controller:   Replace back up battery in 9 months.  Annual Equipment Maintenance on UBC/PM was performed on 09/2018.   I reviewed the LVAD parameters from today and compared the results to the patient's prior recorded data. LVAD interrogation was NEGATIVE for significant power changes, NEGATIVE for clinical alarms and STABLE for PI events/speed drops. No programming changes were made and pump is functioning within specified parameters. Pt is performing daily controller and system monitor self tests along with completing weekly and monthly maintenance for LVAD equipment.  LVAD equipment check completed and is in good working order. Back-up equipment present. Charged back up battery and performed self-test on equipment.   Exit Site Care: Drive line is being maintained weekly  by [name]. Drive line exit site well healed and incorporated. The velour is fully implanted at exit site. Dressing dry and intact. No erythema or drainage. Stabilization device present and accurately applied. Pt denies fever or  chills. Pt states they have adequate dressing supplies at home.   Significant Events on VAD Support:  02/2015 >>GIB (removed ASA, 2-2.5 INR) 10/2015 >>CVA(Plavix started) 02/2017> GIB (removed ASA, scopes negative) 03/2017> percutaneous lead repair  Device:Medtronic Therapies: on Last check: Today in clinic. Followed at device clinic, Dr Caryl Comes  BP & Labs:  MAP 84 - Doppler is reflecting MAP  Hgb 11.3 - No S/S of bleeding. Specifically denies melena/BRBPR or nosebleeds.  LDH continues to be elevated at 437 with established baseline of 250- 300. Denies tea-colored urine. No power elevations noted on interrogation.   Plan: 1. Give 1 liter NS in clinic today. 2. RTC as scheduled. 3. Repeat LDH and INR at [place] next [place] RN [place] Coordinator   Office: [phone] 24/7 Emergency [place] Pager: [phone]

## 2017-10-07 NOTE — Progress Notes (Signed)
ide

## 2017-10-08 LAB — LACTATE DEHYDROGENASE: LDH: 437 IU/L — AB (ref 121–224)

## 2017-10-08 LAB — PROTIME-INR
INR: 2.3 — ABNORMAL HIGH (ref 0.8–1.2)
PROTHROMBIN TIME: 24 s — AB (ref 9.1–12.0)

## 2017-10-08 NOTE — Progress Notes (Signed)
Patient ID: VANCE HOCHMUTH, male   DOB: 15-May-1941, 76 y.o.   MRN: [id number]   VAD CLINIC PROGRESS NOTE  PCP: Dr. Kandice Robinsons ([place]) GU: Dr Risa Grill  Neuro Surgeon: Dr Vertell Limber Vascular: Dr Kellie Simmering Primary HF: Dr Haroldine Laws  Zack Crager is a 76 yo male with h/o VT, PAD and severe systolic HF (EF 71-24%) due to mixed ischemic/nonischemic CM he is s/p HM II LVAD implant for destination therapy on October 19, 2013.VAD implantation was complicated by VT, syncope, AF/Aflutter and RHF. Required short term milrinone.    GU Event 09/01/14 had impacted ureteral stone and underwent cystoscopy and flexible ureteroscopy with lithotripsy and basket extraction.   09/2015 Hematuria and chronic nephrolithiasis. Renal US showed mild hyronephrosis and bilateral renal cysts. Dr. Risa Grill took back for repeat lithotripsy and stone extraction by ureteroscopy and J stent placement.  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  4/2-18 - Episode of melena. Hgb 10.9-> 8.7. 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  01/2016 R CEA. He was started on Plavix.  01/2016 Lumbar fusion L3-L5 with pedicle screws posterolateral arthrodesis with BMP, allograft  Admitted 9/17 with high fevers and productive cough. CXR negative. Treated with levaquin x 7 days for acute bronchitis. Also found to have VT and underwent DC-CV. Seen by EP and amio increased  Had ramp echo in 10/17 speed turned down to 8800 due to RV failure.  Admitted 4/18 with melena and drop in Hgb. Transfused 2u. EGD/colon negative.  Admitted 5/18 with pump stops. 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  Admitted 10/13-15/18 with slow VT and ADHF.Cardioverted in ED and amio uptitrated. Also diuresed. Saw Dr. Caryl Comes earlier this week and amio decreased.   Follow up LVAD Clinic: He returns today for unscheduled sick visit. Says for past 2 days has felt  achy all over, very fatigued and has had low-grade temps. No chills. Denies N/V/D or other GI symptoms. PO intake very poor. No dysuria. Denies orthopnea or PND. No problems with driveline. No bleeding, melena or neuro symptoms. No VAD alarms. Taking all meds as prescribed.  VAD Indication: Destination therapy - age excluding  VAD interrogation & Equipment Management: Speed:8800 Flow: 3.4 Power:4.23 w PI:7.4  Alarms: no clinical alarms Events: 11/27- low flow, 12/3- low flow.  Fixed speed 8800 Low speed limit: 8200  Primary Controller: Replace back up battery in 55months. Back up controller: Replace back up battery in 61months.  Annual Equipment Maintenance on UBC/PM was performed on 09/2018.  Past Medical History:  Diagnosis Date  . Arthritis   . Automatic implantable cardioverter-defibrillator in situ    a. s/p Medtronic Evera device serial [id number]    . Bradycardia   . CAD (coronary artery disease)    a. s/p MI 1982;  s/p DES to CFX 2011;  s/p NSTEMI 4/12 in setting of VTach;  cath 7/12:  LM ok, LAD mid 30-40%, Dx's with 40%, mCFX stent patent, prox to mid RCA occluded with R to R and L to R collats.  Medical Tx was continued  . Chronic systolic heart failure ([place])    a. s/p LVAD 10/2013 for destination therapy  . CKD (chronic kidney disease)   . CVD (cerebrovascular disease)    a. s/p L CEA 2008  . Cytopenia   . Dysrhythmia    AFIB  . History of GI bleed    a. on ASA-  started on plavix during 10/2015 admission for CVA  . HLD (hyperlipidemia)   . HTN (hypertension)   . Hypothyroidism   . Inappropriate therapy from implantable cardioverter-defibrillator    a. ATP for sinus tach SVT wavelet identified SVT but was no  passive (2014)  . Ischemic cardiomyopathy   . 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 L Ear   . Myocardial infarction ([place])    1992  . PAF (paroxysmal atrial fibrillation) ([place])   . PVD (peripheral vascular disease) ([place])    a. 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 Shortness of breath dyspnea    W/ EXERTION   . Sleep apnea    NO CPAP NOW  HE SENT IT BACK YRS AGO  . Stroke [facility])    a. 10/2015 admission   . Ventricular tachycardia ([place])    a. s/p AICD;   h/o ICD shock;  Amiodarone Rx. S/P ablation in 2012    Current Outpatient Medications  Medication Sig Dispense Refill  . amiodarone (PACERONE) 200 MG tablet Take 200 mg 2 (two) times daily by mouth.    [name] atorvastatin (LIPITOR) 80 MG tablet TAKE 1 TABLET DAILY AT 6 PM (Patient taking differently: TAKE 1 TABLET (80 mg) DAILY in the morning) 30 tablet 11  . clopidogrel (PLAVIX) 75 MG tablet Take 1 tablet (75 mg total) daily by mouth. 90 tablet 6  . docusate sodium (COLACE) 100 MG capsule Take 200 mg by mouth 2 (two) times daily.     [name] levothyroxine (SYNTHROID, LEVOTHROID) 200 MCG tablet Take 200 mcg (1 tab) by mouth daily except take 300 mcg (1 and 1/2 tab) by mouth every Monday.    . metoprolol succinate (TOPROL-XL) 25 MG 24 hr tablet Take 1 tablet (25 mg total) by mouth 2 (two) times daily. 180 tablet 3  . Multiple Vitamins-Minerals (ICAPS PO) Take 1 tablet by mouth 2 (two) times daily.    . nitroGLYCERIN (NITROSTAT) 0.4 MG SL tablet Place 0.4 mg under the tongue every 5 (five) minutes as needed for chest pain.    . pantoprazole (PROTONIX) 40 MG tablet Take 1 tablet (40 mg total) by mouth 2 (two) times daily. 180 tablet 6  . ranolazine (RANEXA) 500 MG 12 hr tablet Take 1 tablet (500 mg total) by mouth 2 (two) times daily. 60 tablet 6  . warfarin (COUMADIN) 2 MG tablet Take 2 mg (1 tablet) daily except 4 mg (2 tablets) on Tues, Thurs, Sun.    . zinc gluconate 50 MG tablet Take 50 mg by mouth 2 (two) times daily.      No current facility-administered medications for this encounter.     Demerol [meperidine]  REVIEW OF SYSTEMS: All systems negative except as listed in HPI, PMH and Problem  list.  Vital Signs:  Doppler Pressure 84                Automatc BP: 103/10 (82) HR:64  SPO2:98  % Temp 98  Weight: 201.4 lb w/o eqt Last weight: 204.8 lb Home weights: 193-194  lbs    Physical Exam: General:  Fatigued appearing otherwise NAD HEENT: normal  Neck: supple. JVP flat.  Carotids 2+ bilat; no bruits. No lymphadenopathy or thryomegaly appreciated. Cor: LVAD hum.  Lungs: Clear. Abdomen: obese soft, nontender, non-distended. No hepatosplenomegaly. No bruits or masses. Good bowel sounds.  Driveline site clean. Anchor in place.  Extremities: no cyanosis, clubbing, rash. Warm no edema  Neuro: alert & oriented x 3. No focal deficits. Moves all 4 without problem    ASSESSMENT AND PLAN:  1. Flu-like illness - Flu swab done in clinic and was negative. Normal WBC. Suspect viral illness. - We discussed 24-48 hour observation but no bed available - he received 1L IVF in Clinic and feels comfortable going home. We will follow closely.  2. Chronic systolic HF: s/p HM-II LVAD 10/2014.  Remains NYHA II.   - He is dry with poor po intake and multiple PI events on VAD (VAD interrogated personally). He takes lasix only PRN. Continue to hold for now. Push fluids - LVAD speed reduced to 8800 in 10/17 due to RV failure. - Multiple PI events and one low flow alarm - s/p previous driveline repair. No further pump stops 3.  Atrial arrhythmias: Atrial fibrillation, atrial tachycardia.  S/p DC-CV 06/04/14, DC-CV 03/02/15 and 01/19/16.  - [place] in NSR on ECG today. Continue to use AliveCor for surveillance. Continue amio. Watch for recurrent thyroid issues. 4. HTN:  - Blood pressure ok despite volume depletion. Continue current regimen. 5. LVAD:  - VAD interrogated personally. See above - LDH has been trending up but back down to 358 today  5. CKD, stage III:   - Stable. Creatinine stable today 1.42 6. Anticoagulation management:  - Goal INR 2-2.5 INR 2.38 today  He is off ASA with GI  bleeding. Continue Plavix.  - Uses home INR monitor.. Discussed with  PharmD personally - LDH starting to drop back down 298 -> 306 -> 351 -> 382 -> 434 -> 358. No power spikes.  7. H/o GIB:  - s/p clipping of 2 jejunal AVMs in 4/16. Continue coumadin and plavix.(with h/o CVA). - Admitted 4/18 with recurrent GIB. Transfused 2u. EGD/colon negative - No recent bleeding    8. Lumbar compression fracture:  s/p L3-L5 fusion 02/03/16. --back pain resolved 9. 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  Continue atorvastatin. (also warfarin INR goal 2-2.5). S/p R CEA 01/13/16. Continue  plavix.  90. H/o amio-induced hypethyroidism:  --Followed by Dr. Forde Dandy in past.  --Amio restarted due to recurrent atrial arrhythmias and VT.  --Recent TSH ok.  --Continue to follow 12. VT  - underwent DC-CV 9/17.  - repeat episode of slow VT 08/17/17. Requiring DC-CV. Amio increased for 1 week  To 400 bid. Now on 200 bid. Has seen Dr. Caryl Comes. Can decrease to 200 bid in several weeks - Keep K > 4.0 Mg > 2.0 - Remains in NSR on ECG and AliveCor device. Will continue to monitor  Iness Pangilinan,MD 1:11 PM

## 2017-10-11 ENCOUNTER — Telehealth (HOSPITAL_COMMUNITY): Payer: Self-pay | Admitting: *Deleted

## 2017-10-11 NOTE — Telephone Encounter (Signed)
Called pt's wife per Dr. Haroldine Laws. After reviewing most recent thyroid test results - instructed her to decrease amiodarone to 200 mg daily. TSH, T3, and T3 results sent to Dr. Caryl Comes and Dr. Cruzita Lederer for Synthroid management. Wife verbalized understanding of same.

## 2017-10-11 NOTE — Telephone Encounter (Signed)
Wife called to report patient is still having symptoms of weakness and body aches. Has had no fever. Dr. Haroldine Laws updated - instructed pt to continue fluids, tylenol, and rest. Instructed to call VAD pager if symptoms do not improve or worsen. Wife verbalized understanding of same.

## 2017-10-14 ENCOUNTER — Other Ambulatory Visit: Payer: Self-pay

## 2017-10-14 ENCOUNTER — Inpatient Hospital Stay (HOSPITAL_COMMUNITY)
Admission: EM | Admit: 2017-10-14 | Discharge: 2017-10-22 | DRG: 378 | Disposition: A | Discharge status: Home / Self Care | Payer: Medicare Other | Attending: Internal Medicine | Admitting: Internal Medicine

## 2017-10-14 ENCOUNTER — Encounter (HOSPITAL_COMMUNITY): Payer: Self-pay | Admitting: Emergency Medicine

## 2017-10-14 DIAGNOSIS — I255 Ischemic cardiomyopathy: Secondary | ICD-10-CM | POA: Diagnosis present

## 2017-10-14 DIAGNOSIS — I5022 Chronic systolic (congestive) heart failure: Secondary | ICD-10-CM | POA: Diagnosis present

## 2017-10-14 DIAGNOSIS — E039 Hypothyroidism, unspecified: Secondary | ICD-10-CM | POA: Diagnosis present

## 2017-10-14 DIAGNOSIS — I471 Supraventricular tachycardia: Secondary | ICD-10-CM | POA: Diagnosis present

## 2017-10-14 DIAGNOSIS — N183 Chronic kidney disease, stage 3 (moderate): Secondary | ICD-10-CM | POA: Diagnosis present

## 2017-10-14 DIAGNOSIS — Z87891 Personal history of nicotine dependence: Secondary | ICD-10-CM | POA: Diagnosis not present

## 2017-10-14 DIAGNOSIS — Z8673 Personal history of transient ischemic attack (TIA), and cerebral infarction without residual deficits: Secondary | ICD-10-CM

## 2017-10-14 DIAGNOSIS — D649 Anemia, unspecified: Secondary | ICD-10-CM | POA: Diagnosis present

## 2017-10-14 DIAGNOSIS — I480 Paroxysmal atrial fibrillation: Secondary | ICD-10-CM | POA: Diagnosis present

## 2017-10-14 DIAGNOSIS — Z823 Family history of stroke: Secondary | ICD-10-CM | POA: Diagnosis not present

## 2017-10-14 DIAGNOSIS — I2510 Atherosclerotic heart disease of native coronary artery without angina pectoris: Secondary | ICD-10-CM | POA: Diagnosis present

## 2017-10-14 DIAGNOSIS — Z7901 Long term (current) use of anticoagulants: Secondary | ICD-10-CM | POA: Diagnosis not present

## 2017-10-14 DIAGNOSIS — R55 Syncope and collapse: Secondary | ICD-10-CM | POA: Diagnosis not present

## 2017-10-14 DIAGNOSIS — Z8249 Family history of ischemic heart disease and other diseases of the circulatory system: Secondary | ICD-10-CM | POA: Diagnosis not present

## 2017-10-14 DIAGNOSIS — I130 Hypertensive heart and chronic kidney disease with heart failure and stage 1 through stage 4 chronic kidney disease, or unspecified chronic kidney disease: Secondary | ICD-10-CM | POA: Diagnosis present

## 2017-10-14 DIAGNOSIS — K5520 Angiodysplasia of colon without hemorrhage: Secondary | ICD-10-CM | POA: Diagnosis not present

## 2017-10-14 DIAGNOSIS — Z85820 Personal history of malignant melanoma of skin: Secondary | ICD-10-CM

## 2017-10-14 DIAGNOSIS — G4730 Sleep apnea, unspecified: Secondary | ICD-10-CM | POA: Diagnosis present

## 2017-10-14 DIAGNOSIS — D62 Acute posthemorrhagic anemia: Secondary | ICD-10-CM | POA: Diagnosis present

## 2017-10-14 DIAGNOSIS — N201 Calculus of ureter: Secondary | ICD-10-CM | POA: Diagnosis not present

## 2017-10-14 DIAGNOSIS — K921 Melena: Secondary | ICD-10-CM | POA: Diagnosis present

## 2017-10-14 DIAGNOSIS — Z95810 Presence of automatic (implantable) cardiac defibrillator: Secondary | ICD-10-CM | POA: Diagnosis not present

## 2017-10-14 DIAGNOSIS — E785 Hyperlipidemia, unspecified: Secondary | ICD-10-CM | POA: Diagnosis present

## 2017-10-14 DIAGNOSIS — Z955 Presence of coronary angioplasty implant and graft: Secondary | ICD-10-CM | POA: Diagnosis not present

## 2017-10-14 DIAGNOSIS — I472 Ventricular tachycardia: Secondary | ICD-10-CM | POA: Diagnosis present

## 2017-10-14 DIAGNOSIS — Z95811 Presence of heart assist device: Secondary | ICD-10-CM | POA: Diagnosis not present

## 2017-10-14 DIAGNOSIS — I739 Peripheral vascular disease, unspecified: Secondary | ICD-10-CM | POA: Diagnosis present

## 2017-10-14 DIAGNOSIS — N189 Chronic kidney disease, unspecified: Secondary | ICD-10-CM | POA: Diagnosis present

## 2017-10-14 DIAGNOSIS — I252 Old myocardial infarction: Secondary | ICD-10-CM

## 2017-10-14 DIAGNOSIS — K922 Gastrointestinal hemorrhage, unspecified: Secondary | ICD-10-CM | POA: Diagnosis not present

## 2017-10-14 DIAGNOSIS — Z79899 Other long term (current) drug therapy: Secondary | ICD-10-CM

## 2017-10-14 DIAGNOSIS — Z9049 Acquired absence of other specified parts of digestive tract: Secondary | ICD-10-CM | POA: Diagnosis not present

## 2017-10-14 DIAGNOSIS — Z79890 Hormone replacement therapy (postmenopausal): Secondary | ICD-10-CM

## 2017-10-14 DIAGNOSIS — R791 Abnormal coagulation profile: Secondary | ICD-10-CM | POA: Diagnosis not present

## 2017-10-14 DIAGNOSIS — N202 Calculus of kidney with calculus of ureter: Secondary | ICD-10-CM | POA: Diagnosis present

## 2017-10-14 DIAGNOSIS — Z7902 Long term (current) use of antithrombotics/antiplatelets: Secondary | ICD-10-CM

## 2017-10-14 LAB — CBC
HCT: 20.4 % — ABNORMAL LOW (ref 39.0–52.0)
HCT: 26.1 % — ABNORMAL LOW (ref 39.0–52.0)
HEMATOCRIT: 23.5 % — AB (ref 39.0–52.0)
HEMOGLOBIN: 7.6 g/dL — AB (ref 13.0–17.0)
Hemoglobin: 6.5 g/dL — CL (ref 13.0–17.0)
Hemoglobin: 8.6 g/dL — ABNORMAL LOW (ref 13.0–17.0)
MCH: 30.4 pg (ref 26.0–34.0)
MCH: 30.8 pg (ref 26.0–34.0)
MCH: 30.4 pg (ref 26.0–34.0)
MCHC: 31.9 g/dL (ref 30.0–36.0)
MCHC: 32.3 g/dL (ref 30.0–36.0)
MCHC: 33 g/dL (ref 30.0–36.0)
MCV: 95.3 fL (ref 78.0–100.0)
MCV: 95.1 fL (ref 78.0–100.0)
MCV: 92.2 fL (ref 78.0–100.0)
PLATELETS: 229 10*3/uL (ref 150–400)
PLATELETS: 174 10*3/uL (ref 150–400)
Platelets: 179 10*3/uL (ref 150–400)
RBC: 2.14 MIL/uL — AB (ref 4.22–5.81)
RBC: 2.47 MIL/uL — ABNORMAL LOW (ref 4.22–5.81)
RBC: 2.83 MIL/uL — ABNORMAL LOW (ref 4.22–5.81)
RDW: 17.6 % — AB (ref 11.5–15.5)
RDW: 17.6 % — AB (ref 11.5–15.5)
RDW: 17.9 % — AB (ref 11.5–15.5)
WBC: 9.2 10*3/uL (ref 4.0–10.5)
WBC: 9.2 10*3/uL (ref 4.0–10.5)
WBC: 7.2 10*3/uL (ref 4.0–10.5)

## 2017-10-14 LAB — MRSA PCR SCREENING: MRSA by PCR: NEGATIVE

## 2017-10-14 LAB — URINALYSIS, ROUTINE W REFLEX MICROSCOPIC
Bacteria, UA: NONE SEEN
Bilirubin Urine: NEGATIVE
GLUCOSE, UA: NEGATIVE mg/dL
KETONES UR: NEGATIVE mg/dL
Leukocytes, UA: NEGATIVE
Nitrite: NEGATIVE
PH: 5 (ref 5.0–8.0)
Protein, ur: NEGATIVE mg/dL
SPECIFIC GRAVITY, URINE: 1.015 (ref 1.005–1.030)
SQUAMOUS EPITHELIAL / LPF: NONE SEEN

## 2017-10-14 LAB — BASIC METABOLIC PANEL
Anion gap: 8 (ref 5–15)
BUN: 54 mg/dL — AB (ref 6–20)
CALCIUM: 8.8 mg/dL — AB (ref 8.9–10.3)
CO2: 21 mmol/L — AB (ref 22–32)
CREATININE: 1.36 mg/dL — AB (ref 0.61–1.24)
Chloride: 106 mmol/L (ref 101–111)
GFR calc Af Amer: 56 mL/min — ABNORMAL LOW (ref >60)
GFR calc non Af Amer: 49 mL/min — ABNORMAL LOW (ref >60)
GLUCOSE: 121 mg/dL — AB (ref 65–99)
Potassium: 4.7 mmol/L (ref 3.5–5.1)
Sodium: 135 mmol/L (ref 135–145)

## 2017-10-14 LAB — PREPARE RBC (CROSSMATCH)

## 2017-10-14 LAB — PROTIME-INR
INR: 3.93
INR: 3.75
PROTHROMBIN TIME: 38.2 s — AB (ref 11.4–15.2)
Prothrombin Time: 36.8 seconds — ABNORMAL HIGH (ref 11.4–15.2)

## 2017-10-14 LAB — POC OCCULT BLOOD, ED: Fecal Occult Bld: POSITIVE — AB

## 2017-10-14 LAB — GLUCOSE, CAPILLARY: Glucose-Capillary: 111 mg/dL — ABNORMAL HIGH (ref 65–99)

## 2017-10-14 LAB — LACTATE DEHYDROGENASE: LDH: 279 U/L — ABNORMAL HIGH (ref 98–192)

## 2017-10-14 MED ORDER — LEVOTHYROXINE SODIUM 75 MCG PO TABS
150.0000 ug | ORAL_TABLET | Freq: Every day | ORAL | Status: DC
  Administered 2017-10-14 – 2017-10-22 (×9): 150 ug via ORAL
  Filled 2017-10-14 (×9): qty 2

## 2017-10-14 MED ORDER — RANOLAZINE ER 500 MG PO TB12
500.0000 mg | ORAL_TABLET | Freq: Two times a day (BID) | ORAL | Status: DC
  Administered 2017-10-14 – 2017-10-22 (×17): 500 mg via ORAL
  Filled 2017-10-14 (×17): qty 1

## 2017-10-14 MED ORDER — SODIUM CHLORIDE 0.9 % IV SOLN: Freq: Once | INTRAVENOUS | Status: AC

## 2017-10-14 MED ORDER — AMIODARONE HCL 200 MG PO TABS
200.0000 mg | ORAL_TABLET | Freq: Every day | ORAL | Status: DC
  Administered 2017-10-14 – 2017-10-22 (×9): 200 mg via ORAL
  Filled 2017-10-14 (×9): qty 1

## 2017-10-14 MED ORDER — ONDANSETRON HCL 4 MG/2ML IJ SOLN: 4.0000 mg | Freq: Four times a day (QID) | INTRAMUSCULAR | Status: DC | PRN

## 2017-10-14 MED ORDER — PANTOPRAZOLE SODIUM 40 MG IV SOLR
40.0000 mg | Freq: Once | INTRAVENOUS | Status: AC
  Administered 2017-10-14: 40 mg via INTRAVENOUS
  Filled 2017-10-14: qty 40

## 2017-10-14 MED ORDER — SODIUM CHLORIDE 0.9 % IV BOLUS (SEPSIS)
1000.0000 mL | Freq: Once | INTRAVENOUS | Status: AC
  Administered 2017-10-14: 1000 mL via INTRAVENOUS

## 2017-10-14 MED ORDER — ACETAMINOPHEN 325 MG PO TABS: 650.0000 mg | ORAL_TABLET | ORAL | Status: DC | PRN

## 2017-10-14 MED ORDER — SODIUM CHLORIDE 0.9 % IV SOLN
10.0000 mL/h | Freq: Once | INTRAVENOUS | Status: AC
  Administered 2017-10-14: 10 mL/h via INTRAVENOUS

## 2017-10-14 MED ORDER — ATORVASTATIN CALCIUM 80 MG PO TABS
80.0000 mg | ORAL_TABLET | Freq: Every day | ORAL | Status: DC
  Administered 2017-10-14 – 2017-10-21 (×7): 80 mg via ORAL
  Filled 2017-10-14 (×8): qty 1

## 2017-10-14 MED ORDER — PANTOPRAZOLE SODIUM 40 MG IV SOLR: 40.0000 mg | INTRAVENOUS | Status: DC

## 2017-10-14 MED ORDER — PANTOPRAZOLE SODIUM 40 MG PO TBEC
40.0000 mg | DELAYED_RELEASE_TABLET | Freq: Two times a day (BID) | ORAL | Status: DC
  Administered 2017-10-14 – 2017-10-22 (×17): 40 mg via ORAL
  Filled 2017-10-14 (×15): qty 1

## 2017-10-14 NOTE — Progress Notes (Signed)
Received page from pts caregiver stating that Dylan has been having black stools and that he went to go to the bathroom and "collapsed." she states that the pt is conscious and breathing. Caregiver is calling 911 and will have pt transported to [facility]. Dr. Haroldine Laws was notified. ED notified and charge nurse instructed to get LDH, BMET, CBC and PT/INR as well as Type and Screen. Nurse was given Dr. Gillermina Hu contact info and was instructed to call him once labs were back and he will come see the pt.   Tanda Rockers RN, BSN VAD Coordinator 24/7 Pager [phone]

## 2017-10-14 NOTE — Progress Notes (Signed)
   Hgb 7.6   Give 2 more units of blood now.   Check CBC 1 hour after blood completed.   Amy Clegg NP-C  10:56 AM

## 2017-10-14 NOTE — ED Provider Notes (Addendum)
[facility] EMERGENCY DEPARTMENT Provider Note   CSN: [id number] Arrival date & time: 10/14/17  0143     History   Chief Complaint Chief Complaint  Patient presents with  . LVAD PT/SYNCOPE    HPI [name] is a 76 y.o. male.  HPI  This is a 76 year old male with a history with heart failure and LVAD placement, GI bleed with anemia, hypertension hyperlipidemia who presents with syncope.  Patient has been feeling generally weak over the last 2 days.  Significant other reports syncopal episode at home prior to presentation.  She states that he did not fall because she called him and took him to the floor.  Patient states that he did not necessarily feel dizzy prior to syncopal event.  Denies chest pain.  Reports some ongoing shortness of breath but that is not new for him.  While being triaged, patient had a second syncopal episode.  Patient feels like he may be anemic.  Over the last several days has noted dark tarry stools.  No bright red blood.  No hematemesis.  She is on Plavix and Coumadin.  Dr. Haroldine Laws is his primary cardiologist  Past Medical History:  Diagnosis Date  . Arthritis   . Automatic implantable cardioverter-defibrillator in situ    a. s/p Medtronic Evera device serial [id number]    . Bradycardia   . CAD (coronary artery disease)    a. s/p MI 1982;  s/p DES to CFX 2011;  s/p NSTEMI 4/12 in setting of VTach;  cath 7/12:  LM ok, LAD mid 30-40%, Dx's with 40%, mCFX stent patent, prox to mid RCA occluded with R to R and L to R collats.  Medical Tx was continued  . Chronic systolic heart failure ([place])    a. s/p LVAD 10/2013 for destination therapy  . CKD (chronic kidney disease)   . CVD (cerebrovascular disease)    a. s/p L CEA 2008  . Cytopenia   . Dysrhythmia    AFIB  . History of GI bleed    a. on ASA- started on plavix during 10/2015 admission for CVA  . HLD (hyperlipidemia)   . HTN (hypertension)   . Hypothyroidism   .  Inappropriate therapy from implantable cardioverter-defibrillator    a. ATP for sinus tach SVT wavelet identified SVT but was no  passive (2014)  . Ischemic cardiomyopathy   . 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 L Ear   . Myocardial infarction ([place])    1992  . PAF (paroxysmal atrial fibrillation) ([place])   . PVD (peripheral vascular disease) ([place])    a. 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 Shortness of breath dyspnea    W/ EXERTION   . Sleep apnea    NO CPAP NOW  HE SENT IT BACK YRS AGO  . Stroke [facility])    a. 10/2015 admission   . Ventricular tachycardia ([place])    a. s/p AICD;   h/o ICD shock;  Amiodarone Rx. S/P ablation in 2012    Patient Active Problem List   Diagnosis Date Noted  . Left ventricular assist device (LVAD) complication, initial encounter 03/26/2017  . Symptomatic anemia 02/20/2017  . Acute bronchitis 07/19/2016  . Open wound of lumbar region without complication 24/07/7352  . Chronic diastolic heart failure ([place])   . Spinal compression fracture (  [place]) 02/03/2016  . Subtherapeutic international normalized ratio (INR) 02/01/2016  . Carotid stenosis, asymptomatic 01/31/2016  . Carotid stenosis, symptomatic w/o infarct 01/31/2016  . Carotid artery calcification 01/11/2016  . Symptomatic stenosis of right carotid artery 01/09/2016  . History of GI bleed   . PAF (paroxysmal atrial fibrillation) ([place])   . CKD (chronic kidney disease)   . Hypothyroidism   . CVA (cerebral infarction)   . Cardiomyopathy, ischemic   . Carotid stenosis   . History of CEA (carotid endarterectomy)   . Chronic anticoagulation   . Left ventricular assist device (LVAD) complication 67/61/9509  . Jerking movements of extremities 07/14/2015  . Fall 06/14/2015  . Cough 06/01/2015  . Nausea & vomiting 02/28/2015  . Near syncope   . Chronic systolic CHF (congestive heart failure) ([place])   . Atrial  tachycardia, paroxysmal ([place])   . Back pain at L4-L5 level 02/20/2015  . Compression fracture of L4 lumbar vertebra (HCC) 02/20/2015  . Acute GI bleeding 02/12/2015  . Dyspnea 09/13/2014  . Nephrolithiasis 08/09/2014  . 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 CKD (chronic kidney disease) stage 3, GFR 30-59 ml/min (HCC) 03/26/2014  . RVF (right ventricular failure) ([place]) 11/01/2013  . VT (ventricular tachycardia) ([place]) 10/22/2013  . LVAD (left ventricular assist device) present ([place]) 10/20/2013  . Acute on chronic systolic (congestive) heart failure ([place]) 10/19/2013  . Atrial flutter ([place]) 10/15/2013  . Syncope 10/12/2013  . Weakness generalized 09/23/2013  . Other malaise and fatigue 09/23/2013  . Palliative care encounter 09/23/2013  . Fever 09/20/2013  . Sepsis(995.91) 09/20/2013  . Atrial tachycardia ([place]) 08/27/2013  . Chronic systolic heart failure ([place]) 08/27/2013  . Sinus bradycardia 05/11/2013  . Inappropriate therapy from implantable cardioverter-defibrillator 04/02/2013  . History of AAA (abdominal aortic aneurysm) repair 11/25/2012  . CAD (coronary artery disease) 04/30/2012  . Occlusion and stenosis of carotid artery without mention of cerebral infarction 03/25/2012  . PVD (peripheral vascular disease) ([place])   . HTN (hypertension)   . HLD (hyperlipidemia)   . Ischemic cardiomyopathy   . Automatic implantable cardioverter-defibrillator in situ 04/09/2011  . VENTRICULAR TACHYCARDIA 09/07/2009  . RENAL INSUFFICIENCY 09/07/2009    Past Surgical History:  Procedure Laterality Date  . BACK SURGERY    . CARDIAC CATHETERIZATION     "several" (05/25/2013)  . CARDIAC DEFIBRILLATOR PLACEMENT  2003; 2005; 05/25/2013   2014: Medtronic Evera device serial number [id number]  . CARDIAC ELECTROPHYSIOLOGY STUDY AND ABLATION  2012  . CARDIOVERSION N/A 10/15/2013   Procedure: CARDIOVERSION;  Surgeon: Jolaine Artist, MD;  Location: [facility];  Service: Cardiovascular;  Laterality:  N/A;  . CARDIOVERSION N/A 11/04/2013   Procedure: CARDIOVERSION;  Surgeon: Larey Dresser, MD;  Location: [facility];  Service: Cardiovascular;  Laterality: N/A;  . CARDIOVERSION N/A 06/04/2014   Procedure: CARDIOVERSION;  Surgeon: Jolaine Artist, MD;  Location: [facility] ENDOSCOPY;  Service: Cardiovascular;  Laterality: N/A;  . CARDIOVERSION N/A 03/02/2015   Procedure: CARDIOVERSION;  Surgeon: Jolaine Artist, MD;  Location: [facility] ENDOSCOPY;  Service: Cardiovascular;  Laterality: N/A;  . CARDIOVERSION N/A 01/19/2016   Procedure: CARDIOVERSION;  Surgeon: Larey Dresser, MD;  Location: [facility];  Service: Cardiovascular;  Laterality: N/A;  . CAROTID ENDARTERECTOMY Left ~ 2007  . CATARACT EXTRACTION W/ INTRAOCULAR LENS  IMPLANT, BILATERAL Bilateral 2000  . CHOLECYSTECTOMY  12/15/?2010  . COLONOSCOPY WITH PROPOFOL N/A Sep 01, 202018   Procedure: COLONOSCOPY WITH PROPOFOL;  Surgeon: Ronald Lobo, MD;  Location: [facility];  Service:  Endoscopy;  Laterality: N/A;  . CORONARY ANGIOPLASTY  1992  . CORONARY ANGIOPLASTY WITH STENT PLACEMENT     "last one was 11/2012  (05/25/2013)  . CYSTOSCOPY WITH RETROGRADE PYELOGRAM, URETEROSCOPY AND STENT PLACEMENT Left 08/09/2014   Procedure: CYSTOSCOPY WITH RETROGRADE PYELOGRAM, URETEROSCOPY AND STENT PLACEMENT;  Surgeon: Bernestine Amass, MD;  Location: [facility];  Service: Urology;  Laterality: Left;  . CYSTOSCOPY WITH RETROGRADE PYELOGRAM, URETEROSCOPY AND STENT PLACEMENT Left 09/01/2014   Procedure: CYSTOSCOPY WITH URETEROSCOPY AND STENT REMOVAL;  Surgeon: Bernestine Amass, MD;  Location: [facility];  Service: Urology;  Laterality: Left;  . CYSTOSCOPY WITH RETROGRADE PYELOGRAM, URETEROSCOPY AND STENT PLACEMENT Left 09/20/2014   Procedure: CYSTOSCOPY WITH RETROGRADE PYELOGRAM, URETEROSCOPY AND STENT PLACEMENT, stone removal;  Surgeon: Bernestine Amass, MD;  Location: [facility];  Service: Urology;  Laterality: Left;  . DOPPLER ECHOCARDIOGRAPHY  2011  . ELBOW ARTHROSCOPY Right  1990's  . ELECTROPHYSIOLOGIC STUDY N/A 07/19/2016   Procedure: Cardioversion;  Surgeon: Evans Lance, MD;  Location: [facility] CV LAB;  Service: Cardiovascular;  Laterality: N/A;  . ENDARTERECTOMY Right 01/13/2016   Procedure: RIGHT CAROTID ARTERY ENDARTERECTOMY;  Surgeon: Mal Misty, MD;  Location: [facility];  Service: Vascular;  Laterality: Right;  . ENTEROSCOPY Left 02/23/2017   Procedure: ENTEROSCOPY;  Surgeon: Ronald Lobo, MD;  Location: [facility] ENDOSCOPY;  Service: Endoscopy;  Laterality: Left;  . ESOPHAGOGASTRODUODENOSCOPY N/A 02/15/2015   Procedure: ESOPHAGOGASTRODUODENOSCOPY (EGD);  Surgeon: Carol Ada, MD;  Location: [facility] ENDOSCOPY;  Service: Endoscopy;  Laterality: N/A;  LVAD  . EYE SURGERY     VITRECTOMY  LEFT 05/2012  . FEMORAL-POPLITEAL BYPASS GRAFT Right 2011  . FEMORAL-POPLITEAL BYPASS GRAFT Left 05/2002   Archie Endo 05/06/2002 (05/25/2013)  . HEEL SPUR SURGERY Right 1990's  . HOLMIUM LASER APPLICATION Left 40/98/1191   Procedure: HOLMIUM LASER APPLICATION;  Surgeon: Bernestine Amass, MD;  Location[facility];  Service: Urology;  Laterality: Left;  . IMPLANTABLE CARDIOVERTER DEFIBRILLATOR GENERATOR CHANGE N/A 05/25/2013   Procedure: IMPLANTABLE CARDIOVERTER DEFIBRILLATOR GENERATOR CHANGE;  Surgeon: Deboraha Sprang, MD;  Location: [facility] CATH LAB;  Service: Cardiovascular;  Laterality: N/A;  . INSERTION OF IMPLANTABLE LEFT VENTRICULAR ASSIST DEVICE N/A 10/19/2013   Procedure: INSERTION OF IMPLANTABLE LEFT VENTRICULAR ASSIST DEVICE;  Surgeon: Gaye Pollack, MD;  Location: [facility];  Service: Open Heart Surgery;  Laterality: N/A;  CIRC ARREST  NITRIC OXIDE  MEDTRONIC ICD  . INTRAOPERATIVE TRANSESOPHAGEAL ECHOCARDIOGRAM N/A 10/19/2013   Procedure: INTRAOPERATIVE TRANSESOPHAGEAL ECHOCARDIOGRAM;  Surgeon: Gaye Pollack, MD;  Location: [facility] OR;  Service: Open Heart Surgery;  Laterality: N/A;  . KNEE ARTHROSCOPY Bilateral 1990's  . LEAD REVISION N/A 06/05/2013   Procedure: LEAD REVISION;  Surgeon: Thompson Grayer, MD;  Location: [facility] CATH LAB;  Service: Cardiovascular;  Laterality: N/A;  . LEFT HEART CATHETERIZATION WITH CORONARY ANGIOGRAM N/A 11/24/2012   Procedure: LEFT HEART CATHETERIZATION WITH CORONARY ANGIOGRAM;  Surgeon: Peter M Martinique, MD;  Location: [facility] CATH LAB;  Service: Cardiovascular;  Laterality: N/A;  . LIPOMA EXCISION Left 2013   "near ear" (05/25/2013)  . [place]; 2000's  . PATCH ANGIOPLASTY Right 01/13/2016   Procedure: WITH  PATCH ANGIOPLASTY;  Surgeon: Mal Misty, MD;  Location: [facility];  Service: Vascular;  Laterality: Right;  . RENAL ARTERY BYPASS Bilateral 2003  . REVASCULARIZATION / IN-SITU GRAFT LEG    . RIGHT HEART CATHETERIZATION N/A 09/17/2013   Procedure: RIGHT HEART CATH;  Surgeon: Jolaine Artist, MD;  Location: [facility]  CATH LAB;  Service: Cardiovascular;  Laterality: N/A;  . RIGHT HEART CATHETERIZATION N/A 10/14/2013   Procedure: RIGHT HEART CATH;  Surgeon: Jolaine Artist, MD;  Location: [facility] CATH LAB;  Service: Cardiovascular;  Laterality: N/A;  . RIGHT HEART CATHETERIZATION N/A 11/16/2013   Procedure: RIGHT HEART CATH;  Surgeon: Jolaine Artist, MD;  Location: [facility] CATH LAB;  Service: Cardiovascular;  Laterality: N/A;  . RIGHT HEART CATHETERIZATION N/A 07/13/2014   Procedure: RIGHT HEART CATH;  Surgeon: Jolaine Artist, MD;  Location[facility] CATH LAB;  Service: Cardiovascular;  Laterality: N/A;  . TEE WITHOUT CARDIOVERSION N/A 11/04/2013   Procedure: TRANSESOPHAGEAL ECHOCARDIOGRAM (TEE);  Surgeon: Larey Dresser, MD;  Location: [place];  Service: Cardiovascular;  Laterality: N/A;  . TEE WITHOUT CARDIOVERSION N/A 03/02/2015   Procedure: TRANSESOPHAGEAL ECHOCARDIOGRAM (TEE);  Surgeon: Jolaine Artist, MD;  Location: [facility] ENDOSCOPY;  Service: Cardiovascular;  Laterality: N/A;  . TEE WITHOUT CARDIOVERSION N/A 01/19/2016   Procedure: TRANSESOPHAGEAL ECHOCARDIOGRAM (TEE);  Surgeon: Larey Dresser, MD;  Location: [place];  Service: Cardiovascular;   Laterality: N/A;  . TONSILLECTOMY  1946  . TUMOR EXCISION Left 1960's   "fatty tumor" (05/25/2013)  . V-TACH ABLATION N/A 09/12/2011   Procedure: V-TACH ABLATION;  Surgeon: Evans Lance, MD;  Location: [facility] CATH LAB;  Service: Cardiovascular;  Laterality: N/A;  . V-TACH ABLATION N/A 06/08/2013   Procedure: V-TACH ABLATION;  Surgeon: Evans Lance, MD;  Location: [facility] CATH LAB;  Service: Cardiovascular;  Laterality: N/A;       Home Medications    Prior to Admission medications   Medication Sig Start Date End Date Taking? Authorizing Provider  amiodarone (PACERONE) 200 MG tablet Take 200 mg by mouth daily.    [provider]  atorvastatin (LIPITOR) 80 MG tablet TAKE 1 TABLET DAILY AT 6 PM Patient taking differently: TAKE 1 TABLET (80 mg) DAILY in the morning 07/22/17   Bensimhon, Shaune Pascal, MD  clopidogrel (PLAVIX) 75 MG tablet Take 1 tablet (75 mg total) daily by mouth. 09/09/17   Bensimhon, Shaune Pascal, MD  docusate sodium (COLACE) 100 MG capsule Take 200 mg by mouth 2 (two) times daily.     [provider]  levothyroxine (SYNTHROID, LEVOTHROID) 200 MCG tablet Take 200 mcg (1 tab) by mouth daily except take 300 mcg (1 and 1/2 tab) by mouth every Monday.    [provider]  metoprolol succinate (TOPROL-XL) 25 MG 24 hr tablet Take 1 tablet (25 mg total) by mouth 2 (two) times daily. 05/27/17   Larey Dresser, MD  Multiple Vitamins-Minerals (ICAPS PO) Take 1 tablet by mouth 2 (two) times daily.    [provider]  nitroGLYCERIN (NITROSTAT) 0.4 MG SL tablet Place 0.4 mg under the tongue every 5 (five) minutes as needed for chest pain.    [provider]  pantoprazole (PROTONIX) 40 MG tablet Take 1 tablet (40 mg total) by mouth 2 (two) times daily. 04/09/17   Bensimhon, Shaune Pascal, MD  ranolazine (RANEXA) 500 MG 12 hr tablet Take 1 tablet (500 mg total) by mouth 2 (two) times daily. 08/19/17   Clegg, Amy D, NP  warfarin (COUMADIN) 2 MG tablet Take 2 mg (1  tablet) daily except 4 mg (2 tablets) on Tues, Thurs, Sun.    [provider]  zinc gluconate 50 MG tablet Take 50 mg by mouth 2 (two) times daily.     [provider]    Family History Family History  Problem Relation  Age of Onset  . Heart attack Mother   . Coronary artery disease Mother   . Cancer Mother   . Heart disease Mother   . Hyperlipidemia Mother   . Hypertension Mother   . Coronary artery disease Father   . Stroke Father   . Cancer Father   . Heart disease Father        before age 59  . Hyperlipidemia Father   . Hypertension Father   . Heart attack Father   . Coronary artery disease Unknown     Social History Social History   Tobacco Use  . Smoking status: Former Smoker    Packs/day: 0.50    Years: 37.00    Pack years: 18.50    Types: Cigarettes    Last attempt to quit: 11/05/2001    Years since quitting: 15.9  . Smokeless tobacco: Never Used  Substance Use Topics  . Alcohol use: Yes    Alcohol/week: 0.0 oz    Comment: 05/25/2013 "mixed drink couple times/month"  . Drug use: No     Allergies   Demerol [meperidine]   Review of Systems Review of Systems  Constitutional: Negative for fever.  Respiratory: Positive for shortness of breath.   Cardiovascular: Negative for chest pain.  Gastrointestinal: Negative for abdominal pain, nausea and vomiting.       Dark tarry stools  Genitourinary: Negative for dysuria.  Neurological: Positive for syncope.  All other systems reviewed and are negative.    Physical Exam Updated Vital Signs Pulse 70   Temp 97.6 F (36.4 C) (Oral)   Resp 20   Ht 6' (1.829 m)   SpO2 100%   BMI 27.31 kg/m   Physical Exam  Constitutional: He is oriented to person, place, and time.  Chronically ill-appearing, pale, no acute distress  HENT:  Head: Normocephalic and atraumatic.  Pale conjunctiva  Eyes: Pupils are equal, round, and reactive to light.  Cardiovascular:  LVAD hum prominent    Pulmonary/Chest: Effort normal. No respiratory distress. He has no wheezes.  Abdominal: Soft. There is no tenderness.  Genitourinary:  Genitourinary Comments: Gross melena  Musculoskeletal: He exhibits no edema.  Neurological: He is alert and oriented to person, place, and time.  5 Out of 5 strength in all 4 extremities, no dysmetria to finger-nose-finger  Skin: Skin is warm and dry.  Psychiatric: He has a normal mood and affect.  Nursing note and vitals reviewed.    ED Treatments / Results  Labs (all labs ordered are listed, but only abnormal results are displayed) Labs Reviewed  CBC - Abnormal; Notable for the following components:      Result Value   RBC 2.14 (*)    Hemoglobin 6.5 (*)    HCT 20.4 (*)    RDW 17.6 (*)    All other components within normal limits  URINALYSIS, ROUTINE W REFLEX MICROSCOPIC - Abnormal; Notable for the following components:   Hgb urine dipstick MODERATE (*)    All other components within normal limits  PROTIME-INR - Abnormal; Notable for the following components:   Prothrombin Time 38.2 (*)    All other components within normal limits  LACTATE DEHYDROGENASE - Abnormal; Notable for the following components:   LDH 279 (*)    All other components within normal limits  BASIC METABOLIC PANEL - Abnormal; Notable for the following components:   CO2 21 (*)    Glucose, Bld 121 (*)    BUN 54 (*)    Creatinine, Ser 1.36 (*)  Calcium 8.8 (*)    GFR calc non Af [name] 49 (*)    GFR calc Af [name] 56 (*)    All other components within normal limits  POC OCCULT BLOOD, ED - Abnormal; Notable for the following components:   Fecal Occult Bld POSITIVE (*)    All other components within normal limits  TYPE AND SCREEN  PREPARE RBC (CROSSMATCH)    EKG  EKG Interpretation  Date/Time:  Monday October 14 2017 01:49:14 EST Ventricular Rate:  67 PR Interval:    QRS Duration: 130 QT Interval:  474 QTC Calculation: 500 R Axis:   24 Text Interpretation:   Atrial-paced rhythm with prolonged AV conduction Non-specific intra-ventricular conduction block Possible Lateral infarct , age undetermined Abnormal ECG No significant change since last tracing Confirmed by Thayer Jew [phone]) on 10/14/2017 2:12:59 AM       Radiology No results found.  Procedures Procedures (including critical care time)  CRITICAL CARE Performed by: Merryl Hacker   Total critical care time: 45 minutes  Critical care time was exclusive of separately billable procedures and treating other patients.  Critical care was necessary to treat or prevent imminent or life-threatening deterioration.  Critical care was time spent personally by me on the following activities: development of treatment plan with patient and/or surrogate as well as nursing, discussions with consultants, evaluation of patient's response to treatment, examination of patient, obtaining history from patient or surrogate, ordering and performing treatments and interventions, ordering and review of laboratory studies, ordering and review of radiographic studies, pulse oximetry and re-evaluation of patient's condition.   Medications Ordered in ED Medications  0.9 %  sodium chloride infusion (not administered)     Initial Impression / Assessment and Plan / ED Course  I have reviewed the triage vital signs and the nursing notes.  Pertinent labs & imaging results that were available during my care of the patient were reviewed by me and considered in my medical decision making (see chart for details).     Patient presents with syncopal episodes and dark tarry stools.  Syncopal episode in triage.  He has an LVAD patient and is on anticoagulants.  Also with a history of GI bleed.  On my exam he is in no acute distress and appears neurologically intact.  Given clinical exam and history, would be concerned for symptomatic anemia.  He has gross melena on exam.  Recent scope by Dr. Cristina Gong with only  evidence of diverticuli.  Because of bleeding.  Patient was typed and screened.  Lab work sent.  INR is therapeutic.  Hemoglobin is 6.5 down from 11 1 week ago.  Patient will be transfused 2 units of blood.  He has had some low flow alarms on his LVAD.  Per recommendations from Dr. Haroldine Laws, patient was also given 1 L of fluids.  LDH without indication of hemolysis.  And otherwise lab work is stable.  Will consult gastroenterology and provided with Protonix IV.  Discussed with Dr. Haroldine Laws who will admit the patient.  No recommendation for anticoagulant reversal unless patient becomes hemodynamically unstable.  4:08 AM Discussed with Dr. Oletta Lamas, [facility].  They will evaluate the patient.  Final Clinical Impressions(s) / ED Diagnoses   Final diagnoses:  Symptomatic anemia  Syncope, unspecified syncope type  Melena    ED Discharge Orders    None       Charlsie Fleeger, Barbette Hair, MD 10/14/17 [id number]    Merryl Hacker, MD 10/14/17 [phone]

## 2017-10-14 NOTE — H&P (Addendum)
Advanced Heart Failure VAD History and Physical Note    PCP: Dr. Kandice Robinsons ([place]) Primary HF: Dr Haroldine Laws  Reason for Admission: Syncope due to acute upper GI bleed    HPI:    [name] is a 76 yo male with h/o VT, PAD and severe systolic HF (EF 05-39%) due to mixed ischemic/nonischemic CM he is s/p HM II LVAD implant for destination therapy on October 19, 2013.VAD implantation was complicated by VT, syncope, AF/Aflutter and RHF. Required short term milrinone.    GU Event 09/01/14 had impacted ureteral stone and underwent cystoscopy and flexible ureteroscopy with lithotripsy and basket extraction.   09/2015 Hematuria and chronic nephrolithiasis. Renal US showed mild hyronephrosis and bilateral renal cysts. Dr. Risa Grill took back for repeat lithotripsy and stone extraction by ureteroscopy and J stent placement.  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  4/2-18 - Episode of melena. Hgb 10.9-> 8.7. 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  01/2016 R CEA. He was started on Plavix.  01/2016 Lumbar fusion L3-L5 with pedicle screws posterolateral arthrodesis with BMP, allograft  Admitted 9/17 with high fevers and productive cough. CXR negative. Treated with levaquin x 7 days for acute bronchitis. Also found to have VT and underwent DC-CV. Seen by EP and amio increased  Had ramp echo in 10/17 speed turned down to 8800 due to RV failure.  Admitted 4/18 with melena and drop in Hgb. Transfused 2u. EGD/colon negative.  Admitted 5/18 with pump stops. 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  Admitted 10/13-15/18 with slow VT and ADHF.Cardioverted in ED and amio uptitrated. Also diuresed. Saw Dr. Caryl Comes earlier this week and amio decreased.   We saw him in clinic last week feeling weak and achy with low-grade temps. Concern for flu but swab negative. Given 1L of  fluids and allowed him to go home. Hgb stable at 11.3.  Has remained weak since that time. Over past 24 hours or so began having several bouts of frank melena. Tonight was on toilet had another bout of melena and collapsed. Unable to get up. EMS summoned and brought him to ER. In ER had another syncopal episode lasting about 30 seconds. Very pale. Gross melena in rectal vault. Hgb back at 6.5. LDH 279. INR 3.93. Several low flow alarms on VAD. No ab pain or hematemesis. Denies NSAID use. SBP 86 in ER.  LVAD INTERROGATION:  HeartMate II LVAD:  Flow  Liters/min 4.0, speed 8800, power 5.0, PI 7.3.   Multiple PI events and several low flow alarms   Review of Systems: [y] = yes, [ ]  = no   General: Weight gain [ ] ; Weight loss Blue.Reese ]; Anorexia [ ] ; Fatigue Blue.Reese ]; Fever [ ] ; Chills [ ] ; Weakness Blue.Reese ]  Cardiac: Chest pain/pressure [ ] ; Resting SOB [ ] ; Exertional SOB [ ] ; Orthopnea [ ] ; Pedal Edema [ ] ; Palpitations [ ] ; Syncope [ ] ; Presyncope [ ] ; Paroxysmal nocturnal dyspnea[ ]   Pulmonary: Cough [ ] ; Wheezing[ ] ; Hemoptysis[ ] ; Sputum [ ] ; Snoring [ ]   GI: Vomiting[ ] ; Dysphagia[ ] ; Melena[y ]; Hematochezia [ ] ; Heartburn[ ] ; Abdominal pain [ ] ; Constipation [ ] ; Diarrhea [ ] ; BRBPR [ ]   GU: Hematuria[ ] ; Dysuria [ ] ; Nocturia[ ]   Vascular: Pain in legs with walking [ ] ; Pain in feet with lying flat [ ] ; Non-healing sores [ ] ; Stroke [ ] ;  TIA [ ] ; Slurred speech [ ] ;  Neuro: Headaches[ ] ; Vertigo[ ] ; Seizures[ ] ; Paresthesias[ ] ;Blurred vision [ ] ; Diplopia [ ] ; Vision changes [ ]   Ortho/Skin: Arthritis [ y]; Joint pain Blue.Reese ]; Muscle pain [ ] ; Joint swelling [ ] ; Back Pain [ ] ; Rash [ ]   Psych: Depression[ ] ; Anxiety[ ]   Heme: Bleeding problems [ y]; Clotting disorders [ ] ; Anemia Blue.Reese ]  Endocrine: Diabetes [ ] ; Thyroid dysfunction[ y]    Home Medications Prior to Admission medications   Medication Sig Start Date End Date Taking? Authorizing Provider  amiodarone (PACERONE) 200 MG tablet Take 200 mg  by mouth daily.   Yes [provider]  atorvastatin (LIPITOR) 80 MG tablet TAKE 1 TABLET DAILY AT 6 PM Patient taking differently: TAKE 1 TABLET (80 mg) DAILY in the morning 07/22/17  Yes Chauntae Hults, Shaune Pascal, MD  clopidogrel (PLAVIX) 75 MG tablet Take 1 tablet (75 mg total) daily by mouth. 09/09/17  Yes Tommye Lehenbauer, Shaune Pascal, MD  docusate sodium (COLACE) 100 MG capsule Take 200 mg by mouth every morning.    Yes [provider]  furosemide (LASIX) 20 MG tablet Take 20 mg by mouth daily as needed for fluid.   Yes [provider]  levothyroxine (SYNTHROID, LEVOTHROID) 200 MCG tablet Take 200-300 mcg by mouth daily. Take 200 mcg (1 tab) by mouth daily except take 300 mcg (1 and 1/2 tab) by mouth every Monday.    Yes [provider]  metoprolol succinate (TOPROL-XL) 25 MG 24 hr tablet Take 1 tablet (25 mg total) by mouth 2 (two) times daily. 05/27/17  Yes Larey Dresser, MD  Multiple Vitamins-Minerals (ICAPS PO) Take 1 tablet by mouth 2 (two) times daily.   Yes [provider]  nitroGLYCERIN (NITROSTAT) 0.4 MG SL tablet Place 0.4 mg under the tongue every 5 (five) minutes as needed for chest pain.   Yes [provider]  pantoprazole (PROTONIX) 40 MG tablet Take 1 tablet (40 mg total) by mouth 2 (two) times daily. 04/09/17  Yes Kathelyn Gombos, Shaune Pascal, MD  ranolazine (RANEXA) 500 MG 12 hr tablet Take 1 tablet (500 mg total) by mouth 2 (two) times daily. 08/19/17  Yes Clegg, Amy D, NP  warfarin (COUMADIN) 2 MG tablet Take 2-4 mg by mouth daily. Take 2 mg (1 tablet) daily except 4 mg (2 tablets) on Tues, Thurs, Sun.    Yes [provider]  zinc gluconate 50 MG tablet Take 50 mg by mouth 2 (two) times daily.    Yes [provider]    Past Medical History: Past Medical History:  Diagnosis Date  . Arthritis   . Automatic implantable cardioverter-defibrillator in situ    a. s/p Medtronic Evera device serial [id number]    . Bradycardia   . CAD  (coronary artery disease)    a. s/p MI 1982;  s/p DES to CFX 2011;  s/p NSTEMI 4/12 in setting of VTach;  cath 7/12:  LM ok, LAD mid 30-40%, Dx's with 40%, mCFX stent patent, prox to mid RCA occluded with R to R and L to R collats.  Medical Tx was continued  . Chronic systolic heart failure ([place])    a. s/p LVAD 10/2013 for destination therapy  . CKD (chronic kidney disease)   . CVD (cerebrovascular disease)    a. s/p L CEA 2008  . Cytopenia   . Dysrhythmia    AFIB  . History of GI bleed    a. on ASA- started  on plavix during 10/2015 admission for CVA  . HLD (hyperlipidemia)   . HTN (hypertension)   . Hypothyroidism   . Inappropriate therapy from implantable cardioverter-defibrillator    a. ATP for sinus tach SVT wavelet identified SVT but was no  passive (2014)  . Ischemic cardiomyopathy   . 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 L Ear   . Myocardial infarction ([place])    1992  . PAF (paroxysmal atrial fibrillation) ([place])   . PVD (peripheral vascular disease) ([place])    a. 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 Shortness of breath dyspnea    W/ EXERTION   . Sleep apnea    NO CPAP NOW  HE SENT IT BACK YRS AGO  . Stroke [facility])    a. 10/2015 admission   . Ventricular tachycardia ([place])    a. s/p AICD;   h/o ICD shock;  Amiodarone Rx. S/P ablation in 2012    Past Surgical History: Past Surgical History:  Procedure Laterality Date  . BACK SURGERY    . CARDIAC CATHETERIZATION     "several" (05/25/2013)  . CARDIAC DEFIBRILLATOR PLACEMENT  2003; 2005; 05/25/2013   2014: Medtronic Evera device serial number [id number]  . CARDIAC ELECTROPHYSIOLOGY STUDY AND ABLATION  2012  . CARDIOVERSION N/A 10/15/2013   Procedure: CARDIOVERSION;  Surgeon: Jolaine Artist, MD;  Location: [place];  Service: Cardiovascular;  Laterality: N/A;  . CARDIOVERSION N/A 11/04/2013   Procedure: CARDIOVERSION;  Surgeon: Larey Dresser, MD;  Location: [place];  Service: Cardiovascular;  Laterality: N/A;  . CARDIOVERSION N/A 06/04/2014   Procedure: CARDIOVERSION;  Surgeon: Jolaine Artist, MD;  Location: [facility] ENDOSCOPY;  Service: Cardiovascular;  Laterality: N/A;  . CARDIOVERSION N/A 03/02/2015   Procedure: CARDIOVERSION;  Surgeon: Jolaine Artist, MD;  Location: [facility] ENDOSCOPY;  Service: Cardiovascular;  Laterality: N/A;  . CARDIOVERSION N/A 01/19/2016   Procedure: CARDIOVERSION;  Surgeon: Larey Dresser, MD;  Location: [place];  Service: Cardiovascular;  Laterality: N/A;  . CAROTID ENDARTERECTOMY Left ~ 2007  . CATARACT EXTRACTION W/ INTRAOCULAR LENS  IMPLANT, BILATERAL Bilateral 2000  . CHOLECYSTECTOMY  12/15/?2010  . COLONOSCOPY WITH PROPOFOL N/A 01-04-2017   Procedure: COLONOSCOPY WITH PROPOFOL;  Surgeon: Ronald Lobo, MD;  Location: [place];  Service: Endoscopy;  Laterality: N/A;  . CORONARY ANGIOPLASTY  1992  . CORONARY ANGIOPLASTY WITH STENT PLACEMENT     "last one was 11/2012  (05/25/2013)  . CYSTOSCOPY WITH RETROGRADE PYELOGRAM, URETEROSCOPY AND STENT PLACEMENT Left 08/09/2014   Procedure: CYSTOSCOPY WITH RETROGRADE PYELOGRAM, URETEROSCOPY AND STENT PLACEMENT;  Surgeon: Bernestine Amass, MD;  Location: [facility];  Service: Urology;  Laterality: Left;  . CYSTOSCOPY WITH RETROGRADE PYELOGRAM, URETEROSCOPY AND STENT PLACEMENT Left 09/01/2014   Procedure: CYSTOSCOPY WITH URETEROSCOPY AND STENT REMOVAL;  Surgeon: Bernestine Amass, MD;  Location: [facility];  Service: Urology;  Laterality: Left;  . CYSTOSCOPY WITH RETROGRADE PYELOGRAM, URETEROSCOPY AND STENT PLACEMENT Left 09/20/2014   Procedure: CYSTOSCOPY WITH RETROGRADE PYELOGRAM, URETEROSCOPY AND STENT PLACEMENT, stone removal;  Surgeon: Bernestine Amass, MD;  Location: [facility];  Service: Urology;  Laterality: Left;  . DOPPLER ECHOCARDIOGRAPHY  2011  . ELBOW ARTHROSCOPY Right 1990's  . ELECTROPHYSIOLOGIC STUDY N/A 07/19/2016   Procedure: Cardioversion;  Surgeon:  Evans Lance, MD;  Location: [place] CV LAB;  Service: Cardiovascular;  Laterality:  N/A;  . ENDARTERECTOMY Right 01/13/2016   Procedure: RIGHT CAROTID ARTERY ENDARTERECTOMY;  Surgeon: Mal Misty, MD;  Location: [place];  Service: Vascular;  Laterality: Right;  . ENTEROSCOPY Left 02/23/2017   Procedure: ENTEROSCOPY;  Surgeon: Ronald Lobo, MD;  Location: [facility] ENDOSCOPY;  Service: Endoscopy;  Laterality: Left;  . ESOPHAGOGASTRODUODENOSCOPY N/A 02/15/2015   Procedure: ESOPHAGOGASTRODUODENOSCOPY (EGD);  Surgeon: Carol Ada, MD;  Location: [facility] ENDOSCOPY;  Service: Endoscopy;  Laterality: N/A;  LVAD  . EYE SURGERY     VITRECTOMY  LEFT 05/2012  . FEMORAL-POPLITEAL BYPASS GRAFT Right 2011  . FEMORAL-POPLITEAL BYPASS GRAFT Left 05/2002   Archie Endo 05/06/2002 (05/25/2013)  . HEEL SPUR SURGERY Right 1990's  . HOLMIUM LASER APPLICATION Left 44/11/270   Procedure: HOLMIUM LASER APPLICATION;  Surgeon: Bernestine Amass, MD;  Location: [facility];  Service: Urology;  Laterality: Left;  . IMPLANTABLE CARDIOVERTER DEFIBRILLATOR GENERATOR CHANGE N/A 05/25/2013   Procedure: IMPLANTABLE CARDIOVERTER DEFIBRILLATOR GENERATOR CHANGE;  Surgeon: Deboraha Sprang, MD;  Location: [facility] CATH LAB;  Service: Cardiovascular;  Laterality: N/A;  . INSERTION OF IMPLANTABLE LEFT VENTRICULAR ASSIST DEVICE N/A 10/19/2013   Procedure: INSERTION OF IMPLANTABLE LEFT VENTRICULAR ASSIST DEVICE;  Surgeon: Gaye Pollack, MD;  Location: [place];  Service: Open Heart Surgery;  Laterality: N/A;  CIRC ARREST  NITRIC OXIDE  MEDTRONIC ICD  . INTRAOPERATIVE TRANSESOPHAGEAL ECHOCARDIOGRAM N/A 10/19/2013   Procedure: INTRAOPERATIVE TRANSESOPHAGEAL ECHOCARDIOGRAM;  Surgeon: Gaye Pollack, MD;  Location: [facility] OR;  Service: Open Heart Surgery;  Laterality: N/A;  . KNEE ARTHROSCOPY Bilateral 1990's  . LEAD REVISION N/A 06/05/2013   Procedure: LEAD REVISION;  Surgeon: Thompson Grayer, MD;  Location: [facility] CATH LAB;  Service: Cardiovascular;  Laterality: N/A;  . LEFT  HEART CATHETERIZATION WITH CORONARY ANGIOGRAM N/A 11/24/2012   Procedure: LEFT HEART CATHETERIZATION WITH CORONARY ANGIOGRAM;  Surgeon: Peter M Martinique, MD;  Location: [facility] CATH LAB;  Service: Cardiovascular;  Laterality: N/A;  . LIPOMA EXCISION Left 2013   "near ear" (05/25/2013)  . [place]; 2000's  . PATCH ANGIOPLASTY Right 01/13/2016   Procedure: WITH  PATCH ANGIOPLASTY;  Surgeon: Mal Misty, MD;  Location: [place];  Service: Vascular;  Laterality: Right;  . RENAL ARTERY BYPASS Bilateral 2003  . REVASCULARIZATION / IN-SITU GRAFT LEG    . RIGHT HEART CATHETERIZATION N/A 09/17/2013   Procedure: RIGHT HEART CATH;  Surgeon: Jolaine Artist, MD;  Location: [facility] CATH LAB;  Service: Cardiovascular;  Laterality: N/A;  . RIGHT HEART CATHETERIZATION N/A 10/14/2013   Procedure: RIGHT HEART CATH;  Surgeon: Jolaine Artist, MD;  Location: [facility] CATH LAB;  Service: Cardiovascular;  Laterality: N/A;  . RIGHT HEART CATHETERIZATION N/A 11/16/2013   Procedure: RIGHT HEART CATH;  Surgeon: Jolaine Artist, MD;  Location: [facility] CATH LAB;  Service: Cardiovascular;  Laterality: N/A;  . RIGHT HEART CATHETERIZATION N/A 07/13/2014   Procedure: RIGHT HEART CATH;  Surgeon: Jolaine Artist, MD;  Location: [facility] CATH LAB;  Service: Cardiovascular;  Laterality: N/A;  . TEE WITHOUT CARDIOVERSION N/A 11/04/2013   Procedure: TRANSESOPHAGEAL ECHOCARDIOGRAM (TEE);  Surgeon: Larey Dresser, MD;  Location: [place];  Service: Cardiovascular;  Laterality: N/A;  . TEE WITHOUT CARDIOVERSION N/A 03/02/2015   Procedure: TRANSESOPHAGEAL ECHOCARDIOGRAM (TEE);  Surgeon: Jolaine Artist, MD;  Location: [facility] ENDOSCOPY;  Service: Cardiovascular;  Laterality: N/A;  . TEE WITHOUT CARDIOVERSION N/A 01/19/2016   Procedure: TRANSESOPHAGEAL ECHOCARDIOGRAM (TEE);  Surgeon: Larey Dresser, MD;  Location: [place];  Service: Cardiovascular;  Laterality:  N/A;  . TONSILLECTOMY  1946  . TUMOR EXCISION Left 1960's   "fatty  tumor" (05/25/2013)  . V-TACH ABLATION N/A 09/12/2011   Procedure: V-TACH ABLATION;  Surgeon: Evans Lance, MD;  Location: [facility] CATH LAB;  Service: Cardiovascular;  Laterality: N/A;  . V-TACH ABLATION N/A 06/08/2013   Procedure: V-TACH ABLATION;  Surgeon: Evans Lance, MD;  Location: [facility] CATH LAB;  Service: Cardiovascular;  Laterality: N/A;    Family History: Family History  Problem Relation Age of Onset  . Heart attack Mother   . Coronary artery disease Mother   . Cancer Mother   . Heart disease Mother   . Hyperlipidemia Mother   . Hypertension Mother   . Coronary artery disease Father   . Stroke Father   . Cancer Father   . Heart disease Father        before age 77  . Hyperlipidemia Father   . Hypertension Father   . Heart attack Father   . Coronary artery disease Unknown     Social History: Social History   Socioeconomic History  . Marital status: Divorced    Spouse name: None  . Number of children: 2  . Years of education: None  . Highest education level: None  Social Needs  . Financial resource strain: None  . Food insecurity - worry: None  . Food insecurity - inability: None  . Transportation needs - medical: None  . Transportation needs - non-medical: None  Occupational History  . Occupation: retired    Comment: Health visitor  . Occupation: Retired    Comment: Security [employer]  . Occupation: Part-time    Comment: Security at [employer]  . Smoking status: Former Smoker    Packs/day: 0.50    Years: 37.00    Pack years: 18.50    Types: Cigarettes    Last attempt to quit: 11/05/2001    Years since quitting: 15.9  . Smokeless tobacco: Never Used  Substance and Sexual Activity  . Alcohol use: Yes    Alcohol/week: 0.0 oz    Comment: 05/25/2013 "mixed drink couple times/month"  . Drug use: No  . Sexual activity: No  Other Topics Concern  . None  Social History Narrative   Lives with sig other.    Allergies:  Allergies  Allergen  Reactions  . Demerol [Meperidine] Other (See Comments)    Paralysis. Could only move eyes.     Objective:    Vital Signs:   Temp:  [97.6 F (36.4 C)] 97.6 F (36.4 C) (12/10 0201) Pulse Rate:  [70] 70 (12/10 0201) Resp:  [20] 20 (12/10 0201) SpO2:  [100 %] 100 % (12/10 0201)   Filed Weights   SBP in ER 19mm HG  Physical Exam    General:  Pale. Weak appearing. No resp difficulty HEENT: Normal Neck: supple. JVP flat . Carotids 2+ bilat; no bruits. No lymphadenopathy or thyromegaly appreciated. Cor: Mechanical heart sounds with LVAD hum present. Lungs: Clear Abdomen: soft, nontender, nondistended. No hepatosplenomegaly. No bruits or masses. Good bowel sounds. Driveline: C/D/I; securement device intact and driveline incorporated Extremities: no cyanosis, clubbing, rash, edema Neuro: alert & orientedx3, cranial nerves grossly intact. moves all 4 extremities w/o difficulty. Affect pleasant   Telemetry   NSR 60-70s Personally reviewed   EKG   NSR 67 No ST-T wave abnormalities. Personally reviewed    Labs    Basic Metabolic Panel: Recent Labs  Lab 10/07/17 1249 10/14/17 0204  NA 134*  135  K 4.3 4.7  CL 102 106  CO2 24 21*  GLUCOSE 103* 121*  BUN 35* 54*  CREATININE 1.42* 1.36*  CALCIUM 9.1 8.8*    Liver Function Tests: Recent Labs  Lab 10/07/17 1249  AST 30  ALT 20  ALKPHOS 132*  BILITOT 0.6  PROT 7.0  ALBUMIN 3.8   No results for input(s): LIPASE, AMYLASE in the last 168 hours. No results for input(s): AMMONIA in the last 168 hours.  CBC: Recent Labs  Lab 10/07/17 1249 10/14/17 0204  WBC 8.6 9.2  HGB 11.3* 6.5*  HCT 34.6* 20.4*  MCV 93.5 95.3  PLT 179 229    Cardiac Enzymes: No results for input(s): CKTOTAL, CKMB, CKMBINDEX, TROPONINI in the last 168 hours.  BNP: BNP (last 3 results) Recent Labs    08/17/17 1900  BNP 627.8*    ProBNP (last 3 results) No results for input(s): PROBNP in the last 8760 hours.   CBG: No  results for input(s): GLUCAP in the last 168 hours.  Coagulation Studies: Recent Labs    10/14/17 0204  LABPROT 38.2*  INR 3.93    Other results:    Imaging     No results found.    Patient Profile:   [name] is a 76 yo male with h/o VT, PAD and severe systolic HF (EF 26-94%) due to mixed ischemic/nonischemic CM he is s/p HM II LVAD implant for destination therapy on October 19, 2013.VAD admitted for recurrent GI bleed with hgb 6.5   Assessment/Plan:    1. Syncope due to acute GI bleed and symptomatic acute blood loss anemia - will hydrate with NS until blood arrives then transfuse 2u RBCs (will likely need more) - hold coumadin and Plavix (on for CVA). INR 3.9. If continues to bleed actively will give FFP - Dr. Oletta Lamas from [facility] consulted by ER. They will see in am (He has seem Dr. Cristina Gong in past) - has h/o s/p clipping of 2 jejunal AVMs in 4/16.  - Admitted 4/18 with recurrent GIB. Transfused 2u. EGD/colon negative  2. Chronic systolic HF: s/p HM-II LVAD 10/2014.  Remains NYHA II.   - He is dry with low flow events on VAD. Give 1L NS and 2u RBCs - LVAD speed reduced to 8800 in 10/17 due to RV failure. Can reduce further as needed - s/p previous driveline repair. No further pump stops  3.  Atrial arrhythmias: Atrial fibrillation, atrial tachycardia.  S/p DC-CV 06/04/14, DC-CV 03/02/15 and 01/19/16.  - Remians in NSR on ECG today. Continue to use AliveCor for surveillance.  - Amio recently decreased to 200 daily due to weakness and elevated FT4. Watch for recurrent thyroid issues.  4. HTN:  - Blood pressure soft. Should improve with IVF and blood.   5. LVAD:  - VAD interrogated personally. See above - LDH ok today  6. CKD, stage III:   - Stable. Creatinine stable today 1.36. BUN up due to bleed.   6. Anticoagulation management:  - Goal INR 2-2.5 INR 3.93 today  He is off ASA with GI bleeding. Has been on Plavix due to CVA - Uses home INR monitor.. -  Hold warfarin and Plavix with acute bleed. Can give FFP if active bleeding continues   7. 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  S/p R CEA 01/13/16.  - Continue atorva. Hold Plavix  8. H/o amio-induced hypethyroidism:  - Followed by Dr. Forde Dandy in past.  - Amio restarted due to recurrent  atrial arrhythmias and VT.  - Recent Free T4 elevated. Amio cut back to 200 daily. Decrease synthroid from 200 daily to 150 daily.  - Wil need f/u with Dr. Monna Fam if unable to see Dr. Forde Dandy  9.  VT  - underwent DC-CV 9/17.  - repeat episode of slow VT 08/17/17. Requiring DC-CV. Amio increased for 1 week  To 400 bid. Now on 200 daily. Has seen Dr. Caryl Comes. - Keep K > 4.0 Mg > 2.0 - Remains in NSR on ECG and AliveCor device. Will continue to monitor   I reviewed the LVAD parameters from today, and compared the results to the patient's prior recorded data.  No programming changes were made.  The LVAD is functioning within specified parameters.  The patient performs LVAD self-test daily.  LVAD interrogation was negative for any significant power changes, alarms or PI events/speed drops.  LVAD equipment check completed and is in good working order.  Back-up equipment present.   LVAD education done on emergency procedures and precautions and reviewed exit site care.  Length of Stay: 0  Glori Bickers, MD 10/14/2017, 3:42 AM  VAD Team Pager [phone] (7am - 7am) +++VAD ISSUES ONLY+++   Advanced Heart Failure Team Pager [phone] (M-F; [place])  Please contact [facility] for night-coverage after hours (4p -7a ) and weekends on [email or web address] for all non- LVAD Issues

## 2017-10-14 NOTE — Progress Notes (Signed)
   Completed 2UPRBCs. Check CBC in 1 hour. Consult GI. Last dose of coumadin 12/9 .   --In the ED his wife says he had decreased LOC with left arm drawn to his chest and right arm extended. He does not recall what happened. She says it lasted a few seconds and was back to his baseline.   - Check ICD.   ? Seizure. May need CT of head.   Will discuss with Dr Haroldine Laws.  Frank Burpee NP-C  7:51 AM

## 2017-10-14 NOTE — ED Triage Notes (Addendum)
LVAD pt.  Reports generalized weakness x 2 days.  States he believes his HGB is low.  Got up to walk to bathroom tonight with wife's assistance and he had syncopal episode.

## 2017-10-14 NOTE — Progress Notes (Signed)
Notified Amy Clegg NP re Hgb and INR results.  Awaiting orders.  Will continue to monitor.

## 2017-10-14 NOTE — ED Notes (Signed)
As nurse was leaving triage to get manual BP cuff and doppler for pt's BP, heard wife yelling at pt.  Pt had another syncopal episode lasting approx 30 seconds.  Slow to answer questions.  Pt pale.  Pt taken straight to treatment room and Dr. Dina Rich assessing pt.  Notified Tori, primary RN of pt and BP needed.

## 2017-10-14 NOTE — Consult Note (Signed)
[facility] Consult: 8:35 AM 10/14/2017  LOS: 0 days    Referring Provider: Dr Haroldine Laws  Primary Care Physician:   Dr Haroldine Laws Primary Gastroenterologist:  unassigned     Reason for Consultation:  GIB with Melena and blood loss anemia.     HPI: [name] is a 76 y.o. male.  CAD, Hx MIs and DES in 2011.  Ischemic and non-ischemic CM. s/p LVAD 10/2013, destination therapy.  Chronic Coumadin and Plavix.  S/p ICD. PAD. Stroke 2016.  CKD.   Kidney stones.  Previous cholecystectomy.     Hx anemia and FOBT + stools.  2 U PRBCs in 02/2017. Takes Protonix 40 mg BID.   02/2015 EGD/enteroscopy.  For melena.  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  Minor bleeding associated with ablation of 1 of the lesions.  02/2017 enteroscopy.  Dr. Cristina Gong. Study normal.  Close attention paid but no abnormalities or source for bleeding encountered. 02/2017 colonoscopy.  Dr. Cristina Gong.  Sigmoid diverticulosis.  Otherwise normal study.  No explanations for FOBT positive or drop in Hgb  Tested negative for flu and Hgb 11.3 on 12/3 after c/o weakness, achiness.   Stools dark, loose, tarry starting Wednesday 12/5 and continued through yesterday.  Weakness, dizziness starting 12/6.  Pt lives in [place], so EMS not available to transport to [place] due to weather, pt and wife chose to wait it out.  Pt did not tell his wife about the tarry stools however. Took Plavix yest AM and Coumadin last night.   Collapse, syncope x 2, one at home last nightand once in ED.  Wife drove him to [facility] ED.  Melena on DRE and Hgb drop to 6.5 >> 2 U PRBC >> 7.6.  INR 3.9. Several low flow alarms on VAD.    Patient's constellation of symptoms is consistent with previous episodes of upper GI bleeding.  No anorexia, no nausea.  Compliant with taking Protonix  twice daily.  Generally has brown stools once a day.  No weight loss.  No dysphagia.  Family history of ulcer disease or colon cancer or anemia.  Does not use NSAIDs or aspirin.  Drinks the equivalent of a glass of wine once every 3 or 4 weeks.   Past Medical History:  Diagnosis Date  . Arthritis   . Automatic implantable cardioverter-defibrillator in situ    a. s/p Medtronic Evera device serial [id number]    . Bradycardia   . CAD (coronary artery disease)    a. s/p MI 1982;  s/p DES to CFX 2011;  s/p NSTEMI 4/12 in setting of VTach;  cath 7/12:  LM ok, LAD mid 30-40%, Dx's with 40%, mCFX stent patent, prox to mid RCA occluded with R to R and L to R collats.  Medical Tx was continued  . Chronic systolic heart failure ([place])    a. s/p LVAD 10/2013 for destination therapy  . CKD (chronic kidney disease)   . CVD (cerebrovascular disease)    a. s/p L CEA 2008  . Cytopenia   . Dysrhythmia  AFIB  . History of GI bleed    a. on ASA- started on plavix during 10/2015 admission for CVA  . HLD (hyperlipidemia)   . HTN (hypertension)   . Hypothyroidism   . Inappropriate therapy from implantable cardioverter-defibrillator    a. ATP for sinus tach SVT wavelet identified SVT but was no  passive (2014)  . Ischemic cardiomyopathy   . 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 L Ear   . Myocardial infarction ([place])    1992  . PAF (paroxysmal atrial fibrillation) ([place])   . PVD (peripheral vascular disease) ([place])    a. 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 Shortness of breath dyspnea    W/ EXERTION   . Sleep apnea    NO CPAP NOW  HE SENT IT BACK YRS AGO  . Stroke [facility])    a. 10/2015 admission   . Ventricular tachycardia ([place])    a. s/p AICD;   h/o ICD shock;  Amiodarone Rx. S/P ablation in 2012    Past Surgical History:  Procedure Laterality Date  . BACK SURGERY    . CARDIAC CATHETERIZATION     "several"  (05/25/2013)  . CARDIAC DEFIBRILLATOR PLACEMENT  2003; 2005; 05/25/2013   2014: Medtronic Evera device serial number [id number]  . CARDIAC ELECTROPHYSIOLOGY STUDY AND ABLATION  2012  . CARDIOVERSION N/A 10/15/2013   Procedure: CARDIOVERSION;  Surgeon: Jolaine Artist, MD;  Location: [place];  Service: Cardiovascular;  Laterality: N/A;  . CARDIOVERSION N/A 11/04/2013   Procedure: CARDIOVERSION;  Surgeon: Larey Dresser, MD;  Location: [place];  Service: Cardiovascular;  Laterality: N/A;  . CARDIOVERSION N/A 06/04/2014   Procedure: CARDIOVERSION;  Surgeon: Jolaine Artist, MD;  Location: [facility] ENDOSCOPY;  Service: Cardiovascular;  Laterality: N/A;  . CARDIOVERSION N/A 03/02/2015   Procedure: CARDIOVERSION;  Surgeon: Jolaine Artist, MD;  Location: [facility] ENDOSCOPY;  Service: Cardiovascular;  Laterality: N/A;  . CARDIOVERSION N/A 01/19/2016   Procedure: CARDIOVERSION;  Surgeon: Larey Dresser, MD;  Location: [place];  Service: Cardiovascular;  Laterality: N/A;  . CAROTID ENDARTERECTOMY Left ~ 2007  . CATARACT EXTRACTION W/ INTRAOCULAR LENS  IMPLANT, BILATERAL Bilateral 2000  . CHOLECYSTECTOMY  12/15/?2010  . COLONOSCOPY WITH PROPOFOL N/A 05/10/2017   Procedure: COLONOSCOPY WITH PROPOFOL;  Surgeon: Ronald Lobo, MD;  Location: [place];  Service: Endoscopy;  Laterality: N/A;  . CORONARY ANGIOPLASTY  1992  . CORONARY ANGIOPLASTY WITH STENT PLACEMENT     "last one was 11/2012  (05/25/2013)  . CYSTOSCOPY WITH RETROGRADE PYELOGRAM, URETEROSCOPY AND STENT PLACEMENT Left 08/09/2014   Procedure: CYSTOSCOPY WITH RETROGRADE PYELOGRAM, URETEROSCOPY AND STENT PLACEMENT;  Surgeon: Bernestine Amass, MD;  Location: [facility];  Service: Urology;  Laterality: Left;  . CYSTOSCOPY WITH RETROGRADE PYELOGRAM, URETEROSCOPY AND STENT PLACEMENT Left 09/01/2014   Procedure: CYSTOSCOPY WITH URETEROSCOPY AND STENT REMOVAL;  Surgeon: Bernestine Amass, MD;  Location: [facility];  Service: Urology;  Laterality: Left;  .  CYSTOSCOPY WITH RETROGRADE PYELOGRAM, URETEROSCOPY AND STENT PLACEMENT Left 09/20/2014   Procedure: CYSTOSCOPY WITH RETROGRADE PYELOGRAM, URETEROSCOPY AND STENT PLACEMENT, stone removal;  Surgeon: Bernestine Amass, MD;  Location: [facility];  Service: Urology;  Laterality: Left;  . DOPPLER ECHOCARDIOGRAPHY  2011  . ELBOW ARTHROSCOPY Right 1990's  . ELECTROPHYSIOLOGIC STUDY N/A 07/19/2016   Procedure: Cardioversion;  Surgeon: Evans Lance, MD;  Location: [place] CV LAB;  Service: Cardiovascular;  Laterality: N/A;  . ENDARTERECTOMY Right 01/13/2016   Procedure: RIGHT CAROTID ARTERY ENDARTERECTOMY;  Surgeon: Mal Misty, MD;  Location: [place];  Service: Vascular;  Laterality: Right;  . ENTEROSCOPY Left 02/23/2017   Procedure: ENTEROSCOPY;  Surgeon: Ronald Lobo, MD;  Location: [facility] ENDOSCOPY;  Service: Endoscopy;  Laterality: Left;  . ESOPHAGOGASTRODUODENOSCOPY N/A 02/15/2015   Procedure: ESOPHAGOGASTRODUODENOSCOPY (EGD);  Surgeon: Carol Ada, MD;  Location: [facility] ENDOSCOPY;  Service: Endoscopy;  Laterality: N/A;  LVAD  . EYE SURGERY     VITRECTOMY  LEFT 05/2012  . FEMORAL-POPLITEAL BYPASS GRAFT Right 2011  . FEMORAL-POPLITEAL BYPASS GRAFT Left 05/2002   Archie Endo 05/06/2002 (05/25/2013)  . HEEL SPUR SURGERY Right 1990's  . HOLMIUM LASER APPLICATION Left 16/08/9603   Procedure: HOLMIUM LASER APPLICATION;  Surgeon: Bernestine Amass, MD;  [facility];  Service: Urology;  Laterality: Left;  . IMPLANTABLE CARDIOVERTER DEFIBRILLATOR GENERATOR CHANGE N/A 05/25/2013   Procedure: IMPLANTABLE CARDIOVERTER DEFIBRILLATOR GENERATOR CHANGE;  Surgeon: Deboraha Sprang, MD;  Location: [facility] CATH LAB;  Service: Cardiovascular;  Laterality: N/A;  . INSERTION OF IMPLANTABLE LEFT VENTRICULAR ASSIST DEVICE N/A 10/19/2013   Procedure: INSERTION OF IMPLANTABLE LEFT VENTRICULAR ASSIST DEVICE;  Surgeon: Gaye Pollack, MD;  Location: [place];  Service: Open Heart Surgery;  Laterality: N/A;  CIRC ARREST  NITRIC OXIDE  MEDTRONIC ICD    . INTRAOPERATIVE TRANSESOPHAGEAL ECHOCARDIOGRAM N/A 10/19/2013   Procedure: INTRAOPERATIVE TRANSESOPHAGEAL ECHOCARDIOGRAM;  Surgeon: Gaye Pollack, MD;  Location: [facility] OR;  Service: Open Heart Surgery;  Laterality: N/A;  . KNEE ARTHROSCOPY Bilateral 1990's  . LEAD REVISION N/A 06/05/2013   Procedure: LEAD REVISION;  Surgeon: Thompson Grayer, MD;  Location: [facility] CATH LAB;  Service: Cardiovascular;  Laterality: N/A;  . LEFT HEART CATHETERIZATION WITH CORONARY ANGIOGRAM N/A 11/24/2012   Procedure: LEFT HEART CATHETERIZATION WITH CORONARY ANGIOGRAM;  Surgeon: Peter M Martinique, MD;  Location: [facility] CATH LAB;  Service: Cardiovascular;  Laterality: N/A;  . LIPOMA EXCISION Left 2013   "near ear" (05/25/2013)  . [employer]; 2000's  . PATCH ANGIOPLASTY Right 01/13/2016   Procedure: WITH  PATCH ANGIOPLASTY;  Surgeon: Mal Misty, MD;  Location: [place];  Service: Vascular;  Laterality: Right;  . RENAL ARTERY BYPASS Bilateral 2003  . REVASCULARIZATION / IN-SITU GRAFT LEG    . RIGHT HEART CATHETERIZATION N/A 09/17/2013   Procedure: RIGHT HEART CATH;  Surgeon: Jolaine Artist, MD;  Location: [facility] CATH LAB;  Service: Cardiovascular;  Laterality: N/A;  . RIGHT HEART CATHETERIZATION N/A 10/14/2013   Procedure: RIGHT HEART CATH;  Surgeon: Jolaine Artist, MD;  Location: [facility] CATH LAB;  Service: Cardiovascular;  Laterality: N/A;  . RIGHT HEART CATHETERIZATION N/A 11/16/2013   Procedure: RIGHT HEART CATH;  Surgeon: Jolaine Artist, MD;  Location: [facility] CATH LAB;  Service: Cardiovascular;  Laterality: N/A;  . RIGHT HEART CATHETERIZATION N/A 07/13/2014   Procedure: RIGHT HEART CATH;  Surgeon: Jolaine Artist, MD;  Location: [facility] CATH LAB;  Service: Cardiovascular;  Laterality: N/A;  . TEE WITHOUT CARDIOVERSION N/A 11/04/2013   Procedure: TRANSESOPHAGEAL ECHOCARDIOGRAM (TEE);  Surgeon: Larey Dresser, MD;  Location: [place];  Service: Cardiovascular;  Laterality: N/A;  . TEE WITHOUT CARDIOVERSION N/A  03/02/2015   Procedure: TRANSESOPHAGEAL ECHOCARDIOGRAM (TEE);  Surgeon: Jolaine Artist, MD;  Location: [facility] ENDOSCOPY;  Service: Cardiovascular;  Laterality: N/A;  . TEE WITHOUT CARDIOVERSION N/A 01/19/2016   Procedure: TRANSESOPHAGEAL ECHOCARDIOGRAM (TEE);  Surgeon: Kirk Ruths  Claris Gladden, MD;  Location: [place];  Service: Cardiovascular;  Laterality: N/A;  . TONSILLECTOMY  1946  . TUMOR EXCISION Left 1960's   "fatty tumor" (05/25/2013)  . V-TACH ABLATION N/A 09/12/2011   Procedure: V-TACH ABLATION;  Surgeon: Evans Lance, MD;  Location: [facility] CATH LAB;  Service: Cardiovascular;  Laterality: N/A;  . V-TACH ABLATION N/A 06/08/2013   Procedure: V-TACH ABLATION;  Surgeon: Evans Lance, MD;  Location: [facility] CATH LAB;  Service: Cardiovascular;  Laterality: N/A;    Prior to Admission medications   Medication Sig Start Date End Date Taking? Authorizing Provider  amiodarone (PACERONE) 200 MG tablet Take 200 mg by mouth daily.   Yes [provider]  atorvastatin (LIPITOR) 80 MG tablet TAKE 1 TABLET DAILY AT 6 PM Patient taking differently: TAKE 1 TABLET (80 mg) DAILY in the morning 07/22/17  Yes Bensimhon, Shaune Pascal, MD  clopidogrel (PLAVIX) 75 MG tablet Take 1 tablet (75 mg total) daily by mouth. 09/09/17  Yes Bensimhon, Shaune Pascal, MD  docusate sodium (COLACE) 100 MG capsule Take 200 mg by mouth every morning.    Yes [provider]  furosemide (LASIX) 20 MG tablet Take 20 mg by mouth daily as needed for fluid.   Yes [provider]  levothyroxine (SYNTHROID, LEVOTHROID) 200 MCG tablet Take 200-300 mcg by mouth daily. Take 200 mcg (1 tab) by mouth daily except take 300 mcg (1 and 1/2 tab) by mouth every Monday.    Yes [provider]  metoprolol succinate (TOPROL-XL) 25 MG 24 hr tablet Take 1 tablet (25 mg total) by mouth 2 (two) times daily. 05/27/17  Yes Larey Dresser, MD  Multiple Vitamins-Minerals (ICAPS PO) Take 1 tablet by mouth 2 (two) times daily.   Yes [provider]  nitroGLYCERIN (NITROSTAT) 0.4 MG SL tablet Place 0.4 mg under the tongue every 5 (five) minutes as needed for chest pain.   Yes [provider]  pantoprazole (PROTONIX) 40 MG tablet Take 1 tablet (40 mg total) by mouth 2 (two) times daily. 04/09/17  Yes Bensimhon, Shaune Pascal, MD  ranolazine (RANEXA) 500 MG 12 hr tablet Take 1 tablet (500 mg total) by mouth 2 (two) times daily. 08/19/17  Yes Clegg, Amy D, NP  warfarin (COUMADIN) 2 MG tablet Take 2-4 mg by mouth daily. Take 2 mg (1 tablet) daily except 4 mg (2 tablets) on Tues, Thurs, Sun.    Yes [provider]  zinc gluconate 50 MG tablet Take 50 mg by mouth 2 (two) times daily.    Yes [provider]    Scheduled Meds: . amiodarone  200 mg Oral Daily  . atorvastatin  80 mg Oral q1800  . levothyroxine  150 mcg Oral QAC breakfast  . pantoprazole (PROTONIX) IV  40 mg Intravenous Q24H  . ranolazine  500 mg Oral BID   Infusions:  PRN Meds: acetaminophen, ondansetron (ZOFRAN) IV   Allergies as of 10/14/2017 - Review Complete 10/14/2017  Allergen Reaction Noted  . Demerol [meperidine] Other (See Comments) 08/27/2011    Family History  Problem Relation Age of Onset  . Heart attack Mother   . Coronary artery disease Mother   . Cancer Mother   . Heart disease Mother   . Hyperlipidemia Mother   . Hypertension Mother   . Coronary artery disease Father   . Stroke Father   . Cancer Father   . Heart disease Father        before age 20  .  Hyperlipidemia Father   . Hypertension Father   . Heart attack Father   . Coronary artery disease Unknown     Social History   Socioeconomic History  . Marital status: Divorced    Spouse name: Not on file  . Number of children: 2  . Years of education: Not on file  . Highest education level: Not on file  Social Needs  . Financial resource strain: Not on file  . Food insecurity - worry: Not on file  . Food insecurity - inability: Not on file  .  Transportation needs - medical: Not on file  . Transportation needs - non-medical: Not on file  Occupational History  . Occupation: retired    Comment: Health visitor  . Occupation: Retired    Comment: Security [employer]  . Occupation: Part-time    Comment: Security at [employer]  . Smoking status: Former Smoker    Packs/day: 0.50    Years: 37.00    Pack years: 18.50    Types: Cigarettes    Last attempt to quit: 11/05/2001    Years since quitting: 15.9  . Smokeless tobacco: Never Used  Substance and Sexual Activity  . Alcohol use: Yes    Alcohol/week: 0.0 oz    Comment: 05/25/2013 "mixed drink couple times/month"  . Drug use: No  . Sexual activity: No  Other Topics Concern  . Not on file  Social History Narrative   Lives with sig other.    REVIEW OF SYSTEMS: Constitutional:  weakness ENT:  No nose bleeds Pulm:  Occasional SOB,  Not recently.  No cough CV:  No palpitations, no LE edema.  GU:  No hematuria, no frequency GI: No anorexia, no nausea.  Compliant with taking Protonix twice daily.  Generally has brown stools once a day. Heme: Other than this melena he has been having, he has not had any other unusual bleeding or excessive bruising. Transfusions:  Per HPI Neuro: No visual disturbance.  Syncope as per HPI Derm: Scratches his face frequently sometimes this bleeds a little bit. Endocrine:  No sweats or chills.  No polyuria or dysuria Immunization: Current on influenza vaccination and pneumococcal. Travel:  None beyond local counties in last few months.    PHYSICAL EXAM: Vital signs in last 24 hours: Vitals:   10/14/17 0730 10/14/17 0750  BP:  100/77  Pulse:  61  Resp:  17  Temp: 98.7 F (37.1 C)   SpO2:  97%   Wt Readings from Last 3 Encounters:  10/14/17 90.3 kg (199 lb 1.2 oz)  10/07/17 91.4 kg (201 lb 6.4 oz)  08/27/17 94.1 kg (207 lb 6.4 oz)    General: Pleasant, alert, comfortable.  Does not look acutely ill. Head: No facial  asymmetry or swelling.  Excoriations on the cheeks. Eyes: No scleral icterus.  No conjunctival pallor.  EOMI. Ears: Not hard of hearing Nose: No congestion or discharge Mouth: Oropharynx moist and clear. Neck: No masses, no JVD, no thyromegaly. Lungs: Clear bilaterally.  No labored breathing or cough. Heart: VAD hum. Abdomen: Soft.  Not tender.  VAD sight benign.  Active bowel sounds.  No masses or HSM.[name]   Rectal: Did not repeat rectal exam.  Melena present on DRE early this morning in the emergency department Musc/Skeltl: No joint redness, swelling or pronounced deformities. Extremities: No CCE. Neurologic: Alert.  Oriented x3.  Moves all 4 limbs.  No tremor. Skin: Excoriations and rubor on the facial cheeks. Tattoos: None observed. Nodes:  No cervical or inguinal adenopathy. Psych: Pleasant, calm, cooperative.  Intake/Output from previous day: 12/09 0701 - 12/10 0700 In: 1315 [I.V.:1000; Blood:315] Out: 350 [Urine:350] Intake/Output this shift: Total I/O In: 401 [Blood:401] Out: -   LAB RESULTS: Recent Labs    10/14/17 0204  WBC 9.2  HGB 6.5*  HCT 20.4*  PLT 229   BMET Lab Results  Component Value Date   NA 135 10/14/2017   NA 134 (L) 10/07/2017   NA 134 (L) 09/23/2017   K 4.7 10/14/2017   K 4.3 10/07/2017   K 4.5 09/23/2017   CL 106 10/14/2017   CL 102 10/07/2017   CL 104 09/23/2017   CO2 21 (L) 10/14/2017   CO2 24 10/07/2017   CO2 23 09/23/2017   GLUCOSE 121 (H) 10/14/2017   GLUCOSE 103 (H) 10/07/2017   GLUCOSE 90 09/23/2017   BUN 54 (H) 10/14/2017   BUN 35 (H) 10/07/2017   BUN 38 (H) 09/23/2017   CREATININE 1.36 (H) 10/14/2017   CREATININE 1.42 (H) 10/07/2017   CREATININE 1.39 (H) 09/23/2017   CALCIUM 8.8 (L) 10/14/2017   CALCIUM 9.1 10/07/2017   CALCIUM 8.9 09/23/2017   LFT No results for input(s): PROT, ALBUMIN, AST, ALT, ALKPHOS, BILITOT, BILIDIR, IBILI in the last 72 hours. PT/INR Lab Results  Component Value Date   INR 3.93 10/14/2017    INR 2.38 10/07/2017   INR 2.3 (H) 10/07/2017     RADIOLOGY STUDIES: No results found.   IMPRESSION:   *  Melena and blood loss anemia in LVAD pt on Coumadin and Plavix.   S/p PRBC x 2 U.   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  No lesions seen on studies in 02/2017.  Suspect AVMs are culprit.  Never had ulcers and takes PPI BID religiously.    *  LVAD pt on Coumadin.  INR goal is 2 to 2.5.   On Plavix for hx CVA.    *  Azotemia c/w UGIB.  Baseline CKD stage   PLAN:     *  Enteroscopy tomorrow (if INR allows, Dr Havery Moros would like it less than 2.5).  Orders placed in [place].  Clears today.   If enterscopy unrevealing, consider capsule endoscopy.  If aggressively re-bleeds, consider nuclear medicine RBC scan or CT angio.   Coags and CBC in AM.     Azucena Freed  10/14/2017, 8:35 AM Pager: [phone]

## 2017-10-14 NOTE — Progress Notes (Signed)
ANTICOAGULATION CONSULT NOTE - Initial Consult  Pharmacy Consult for Warfarin Indication:   Allergies  Allergen Reactions  . Demerol [Meperidine] Other (See Comments)    Paralysis. Could only move eyes.     Patient Measurements: Height: 6' (182.9 cm) Weight: 199 lb 1.2 oz (90.3 kg) IBW/kg (Calculated) : 77.6   Vital Signs: Temp: 98.7 F (37.1 C) (12/10 0730) Temp Source: Oral (12/10 0730) BP: 100/77 (12/10 0750) Pulse Rate: 61 (12/10 0750)  Labs: Recent Labs    10/14/17 0204 10/14/17 0819  HGB 6.5* 7.6*  HCT 20.4* 23.5*  PLT 229 179  LABPROT 38.2* 36.8*  INR 3.93 3.75  CREATININE 1.36*  --     Estimated Creatinine Clearance: 49.9 mL/min (A) (by C-G formula based on SCr of 1.36 mg/dL (H)).   Medical History: Past Medical History:  Diagnosis Date  . Arthritis   . Automatic implantable cardioverter-defibrillator in situ    a. s/p Medtronic Evera device serial [id number]    . Bradycardia   . CAD (coronary artery disease)    a. s/p MI 1982;  s/p DES to CFX 2011;  s/p NSTEMI 4/12 in setting of VTach;  cath 7/12:  LM ok, LAD mid 30-40%, Dx's with 40%, mCFX stent patent, prox to mid RCA occluded with R to R and L to R collats.  Medical Tx was continued  . Chronic systolic heart failure ([place])    a. s/p LVAD 10/2013 for destination therapy  . CKD (chronic kidney disease)   . CVD (cerebrovascular disease)    a. s/p L CEA 2008  . Cytopenia   . Dysrhythmia    AFIB  . History of GI bleed    a. on ASA- started on plavix during 10/2015 admission for CVA  . HLD (hyperlipidemia)   . HTN (hypertension)   . Hypothyroidism   . Inappropriate therapy from implantable cardioverter-defibrillator    a. ATP for sinus tach SVT wavelet identified SVT but was no  passive (2014)  . Ischemic cardiomyopathy   . 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 L Ear   . Myocardial infarction ([place])    1992  . PAF (paroxysmal atrial fibrillation) ([place])   . PVD (peripheral vascular disease) ([place])    a. 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 Shortness of breath dyspnea    W/ EXERTION   . Sleep apnea    NO CPAP NOW  HE SENT IT BACK YRS AGO  . Stroke [facility])    a. 10/2015 admission   . Ventricular tachycardia ([place])    a. s/p AICD;   h/o ICD shock;  Amiodarone Rx. S/P ablation in 2012      Assessment: 75yom with LVAD HM2 admitted with GIB, h/h 6.5/20.4 INR 3.9.  Plan to hold warfarin, 2 uts PRBC and GI consult.    Goal of Therapy:  INR 2-2.5 ( may change with new GIB - will discuss with MD) Monitor platelets by anticoagulation protocol: Yes   Plan:  Holding warfarin for now F/u drop in INR and GI plan  Bonnita Nasuti Pharm.D. CPP, BCPS Clinical Pharmacist [phone] 10/14/2017 9:32 AM

## 2017-10-14 NOTE — Progress Notes (Signed)
eLink Physician-Brief Progress Note Patient Name: ESCHOL AUXIER DOB: 1941-06-12 MRN: [id number]   Date of Service  10/14/2017  HPI/Events of Note  76 yo male admitted with syncope and lower GI Bleed. Hgb = 6.5. Patient has been transfused. Repeat Hgb pending. GI has been consulted. VSS.   eICU Interventions  No new orders.      Intervention Category Evaluation Type: New Patient Evaluation  Lysle Dingwall 10/14/2017, 6:19 AM

## 2017-10-14 NOTE — Progress Notes (Signed)
LVAD Coordinator Rounding Note:  Admitted 10/14/17 due to weakness and black tarry stools.   HM II LVAD implated on 10/19/13 by Dr. Cyndia Bent under Destination Therapy criteria.  Vital signs: HR: 63 Doppler Pressure: not done Automatic BP: 100/77 (83) O2 Sat: 98 Wt: 199>200>198 lbs   LVAD interrogation reveals:  Speed: 8800 Flow: 4.0 Power:  5.0 PI: 6.5 Alarms: Pt has had 7 low flows over the past 24 hours Events: 10+ PI events  Fixed speed: 8800 Low speed limit: 8200  Patient has back up black bag with extra controller, battery clips, and back up batteries.   Drive Line: dressing dry and intact; pt changes dressing weekly using weekly dressing kit with Sorbaview dressing and biopatch. Will need to be changed 12/16.  Blood products this admission: 4 units/PRBC on 10/14/17  Labs:  Hgb trend: 6.5>7.3   LDH trend: 279  INR trend: 3.93  Anticoagulation Plan: -INR Goal: 2.0 - 2.5 -ASA Dose: no ASA due to hx of GI bleed  Device: - Medtronic dual -Therapies: on  Arrythmias: Slow VT 08/17/17; unable to pace terminate; DC-CV terminated.   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  Call VAD pager if any questions re: VAD equipment.  Tanda Rockers RN, VAD Coordinator [phone] 24/7 VAD pager: [phone]

## 2017-10-15 DIAGNOSIS — R791 Abnormal coagulation profile: Secondary | ICD-10-CM

## 2017-10-15 LAB — CBC
HEMATOCRIT: 24.4 % — AB (ref 39.0–52.0)
HEMATOCRIT: 26.3 % — AB (ref 39.0–52.0)
HEMOGLOBIN: 8 g/dL — AB (ref 13.0–17.0)
Hemoglobin: 8.6 g/dL — ABNORMAL LOW (ref 13.0–17.0)
MCH: 30.1 pg (ref 26.0–34.0)
MCH: 30 pg (ref 26.0–34.0)
MCHC: 32.8 g/dL (ref 30.0–36.0)
MCHC: 32.7 g/dL (ref 30.0–36.0)
MCV: 91.7 fL (ref 78.0–100.0)
MCV: 91.6 fL (ref 78.0–100.0)
PLATELETS: 177 10*3/uL (ref 150–400)
Platelets: 168 10*3/uL (ref 150–400)
RBC: 2.66 MIL/uL — AB (ref 4.22–5.81)
RBC: 2.87 MIL/uL — ABNORMAL LOW (ref 4.22–5.81)
RDW: 18.6 % — ABNORMAL HIGH (ref 11.5–15.5)
RDW: 18.4 % — AB (ref 11.5–15.5)
WBC: 7.2 10*3/uL (ref 4.0–10.5)
WBC: 7.9 10*3/uL (ref 4.0–10.5)

## 2017-10-15 LAB — BASIC METABOLIC PANEL
Anion gap: 7 (ref 5–15)
BUN: 44 mg/dL — AB (ref 6–20)
CHLORIDE: 111 mmol/L (ref 101–111)
CO2: 19 mmol/L — AB (ref 22–32)
CREATININE: 1.17 mg/dL (ref 0.61–1.24)
Calcium: 8.1 mg/dL — ABNORMAL LOW (ref 8.9–10.3)
GFR calc Af Amer: >60 mL/min (ref >60)
GFR calc non Af Amer: 58 mL/min — ABNORMAL LOW (ref >60)
Glucose, Bld: 102 mg/dL — ABNORMAL HIGH (ref 65–99)
POTASSIUM: 4.3 mmol/L (ref 3.5–5.1)
SODIUM: 137 mmol/L (ref 135–145)

## 2017-10-15 LAB — PROTIME-INR
INR: 4.06
INR: 2.86
Prothrombin Time: 39.1 seconds — ABNORMAL HIGH (ref 11.4–15.2)
Prothrombin Time: 29.8 seconds — ABNORMAL HIGH (ref 11.4–15.2)

## 2017-10-15 LAB — PREPARE RBC (CROSSMATCH)

## 2017-10-15 LAB — LACTATE DEHYDROGENASE: LDH: 235 U/L — ABNORMAL HIGH (ref 98–192)

## 2017-10-15 MED ORDER — HYDRALAZINE HCL 20 MG/ML IJ SOLN: 10.0000 mg | Freq: Once | INTRAMUSCULAR | Status: DC

## 2017-10-15 MED ORDER — SODIUM CHLORIDE 0.9 % IV SOLN: Freq: Once | INTRAVENOUS | Status: AC

## 2017-10-15 NOTE — Progress Notes (Signed)
LVAD Coordinator Rounding Note:  Admitted 10/14/17 due to GIB. Hgb 6.5 on arrival.  HM II LVAD implated on 10/19/13 by Dr. Cyndia Bent under Destination Therapy criteria.  Vital signs: HR: 63 Doppler Pressure: 98 Automatic BP: 102/88 (95) O2 Sat: 98 Wt: 199>200>198>197 lbs   LVAD interrogation reveals:  Speed: 8800 Flow: 4.1 Power:  5.0 PI: 7.2 Alarms: none overnight Events: multpile PI events  Fixed speed: 8800 Low speed limit: 8200   Drive Line: Weekly dressing kit. Due 12/16.  Blood products this admission: 4 units/PRBC on 10/14/17 1 FFP, 1 PRBC 10/15/17  Anticoagulation Plan: -INR Goal: 2.0 - 2.5 -ASA Dose: no ASA due to hx of GI bleed  Device: - Medtronic dual -Therapies: on  Adverse Events on VAD: 02/2015 >>GIB (removed ASA, 2-2.5 INR) 10/2015 >>CVA(Plavix started) 02/2017> GIB (removed ASA, scopes negative) 03/2017> percutaneous lead repair History of amiodarone induced-thyrotoxicosis 12/18>GIB  Plan/Recommendations:   1.  Call VAD pager for any equipment needs. 2. VAD Coordinator will accompany patient when endo is scheduled.  Balinda Quails RN, VAD Coordinator 24/7 pager [phone]

## 2017-10-15 NOTE — Progress Notes (Signed)
      Progress Note   Subjective  Patient denies any further bowel movements. Last BM on Sunday AM. He has had 4 units PRBC since admission, including 2 yesterday, and Hgb has not gone up too much. INR rose into 4s. Otherwise denies complaints.   Objective   Vital signs in last 24 hours: Temp:  [98.2 F (36.8 C)-99 F (37.2 C)] 98.2 F (36.8 C) (12/11 0758) Pulse Rate:  [59-91] 63 (12/11 0400) Resp:  [12-26] 12 (12/11 0400) BP: (89-108)/(76-85) 100/77 (12/11 0400) SpO2:  [94 %-100 %] 94 % (12/11 0400) Weight:  [197 lb 8 oz (89.6 kg)] 197 lb 8 oz (89.6 kg) (12/11 0627) Last BM Date: 10/13/17 General:    white male in NAD Heart:  Regular rate and rhythm; LVAD Lungs: Respirations even and unlabored, lungs CTA bilaterally Abdomen:  Soft, nontender and nondistended.  Extremities:  Without edema. Neurologic:  Alert and oriented,  grossly normal neurologically. Psych:  Cooperative. Normal mood and affect.  Intake/Output from previous day: 12/10 0701 - 12/11 0700 In: 8828 [P.O.:410; Blood:1031] Out: 1700 [Urine:1700] Intake/Output this shift: No intake/output data recorded.  Lab Results: Recent Labs    10/14/17 0819 10/14/17 1836 10/15/17 0257  WBC 9.2 7.2 7.2  HGB 7.6* 8.6* 8.0*  HCT 23.5* 26.1* 24.4*  PLT 179 174 168   BMET Recent Labs    10/14/17 0204 10/15/17 0257  NA 135 137  K 4.7 4.3  CL 106 111  CO2 21* 19*  GLUCOSE 121* 102*  BUN 54* 44*  CREATININE 1.36* 1.17  CALCIUM 8.8* 8.1*   LFT No results for input(s): PROT, ALBUMIN, AST, ALT, ALKPHOS, BILITOT, BILIDIR, IBILI in the last 72 hours. PT/INR Recent Labs    10/14/17 0819 10/15/17 0257  LABPROT 36.8* 39.1*  INR 3.75 4.06*    Studies/Results: No results found.     Assessment / Plan:   76 y/o male with a history of LVAD in place with history of CAD with DES, on coumadin and Plavix, admitted with suspected upper GI Bleeding. 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 4 units of PRBC since admission and his HGb has not had an appropriate rise. However, he has not had further bowel movements and BUN starting to downtrend. INR rose into 4s overnight.   I would prefer his INR to be lower prior to enteroscopy, especially in the setting of recent Plavix use. As he's not having any significant blood loss per rectum, I think okay to hold off on endoscopy today, until INR is lower (ideally low 2s). I suspect he may take another few days prior to being ready for this. Please keep him NPO after MN however in case he is ready tomorrow. If he has significant bleeding in the interim, please contact me. Moving forward, if enteroscopy is unremarkable, will proceed with a capsule study. Hopefully if his INR can be kept at lowest acceptable range for his LVAD, this will minimize his bleeding risk.   Call with questions.  Wetmore Cellar, MD [facility] Pager [phone]

## 2017-10-15 NOTE — Anesthesia Preprocedure Evaluation (Addendum)
Anesthesia Evaluation  Patient identified by MRN, date of birth, ID band Patient awake    Reviewed: Allergy & Precautions, NPO status , Patient's Chart, lab work & pertinent test results  Airway Mallampati: I  TM Distance: >3 FB Neck ROM: Full    Dental   Pulmonary shortness of breath, sleep apnea , former smoker,    Pulmonary exam normal        Cardiovascular hypertension, + CAD, + Past MI and +CHF  Normal cardiovascular exam+ dysrhythmias + Cardiac Defibrillator  Rhythm:Regular Rate:Normal     Neuro/Psych    GI/Hepatic   Endo/Other  Hypothyroidism   Renal/GU Renal disease     Musculoskeletal   Abdominal   Peds  Hematology  (+) anemia ,   Anesthesia Other Findings   Reproductive/Obstetrics                            Anesthesia Physical Anesthesia Plan  ASA: IV  Anesthesia Plan: MAC   Post-op Pain Management:    Induction: Intravenous  PONV Risk Score and Plan: 1 and Treatment may vary due to age or medical condition  Airway Management Planned: Natural Airway and Nasal Cannula  Additional Equipment:   Intra-op Plan:   Post-operative Plan:   Informed Consent: I have reviewed the patients History and Physical, chart, labs and discussed the procedure including the risks, benefits and alternatives for the proposed anesthesia with the patient or authorized representative who has indicated his/her understanding and acceptance.   Dental advisory given  Plan Discussed with: CRNA and Surgeon  Anesthesia Plan Comments:        Anesthesia Quick Evaluation

## 2017-10-15 NOTE — Progress Notes (Signed)
Advanced Heart Failure VAD Team Note  Subjective:    Admitted with symptomatic anemia. Hgb on admit 6.5. Coumadin held. GI consulted.   Received 4UPRBCs. Hgb 8.0 today. INR 4.1  Complaining of dizziness when he stands. No BMs over night.    LVAD INTERROGATION:  HeartMate II LVAD:  Flow 4.1  liters/min, speed 8800, power 5, PI 7.2 . 1 PI event in the last 24 hours.   Objective:    Vital Signs:   Temp:  [98.3 F (36.8 C)-99 F (37.2 C)] 98.7 F (37.1 C) (12/11 0356) Pulse Rate:  [59-91] 63 (12/11 0400) Resp:  [12-26] 12 (12/11 0400) BP: (89-108)/(76-85) 100/77 (12/11 0400) SpO2:  [94 %-100 %] 94 % (12/11 0400) Weight:  [197 lb 8 oz (89.6 kg)] 197 lb 8 oz (89.6 kg) (12/11 0627) Last BM Date: 10/13/17 Mean arterial Pressure 80-90s   Intake/Output:   Intake/Output Summary (Last 24 hours) at 10/15/2017 0732 Last data filed at 10/15/2017 0400 Gross per 24 hour  Intake 1040 ml  Output 1700 ml  Net -660 ml     Physical Exam    General:  Pale. No resp difficulty HEENT: normal Neck: supple. JVP 5-6 . Carotids 2+ bilat; no bruits. No lymphadenopathy or thyromegaly appreciated. Cor: Mechanical heart sounds with LVAD hum present. Lungs: clear on room air.  Abdomen: soft, nontender, nondistended. No hepatosplenomegaly. No bruits or masses. Good bowel sounds. Driveline: C/D/I; securement device intact and driveline incorporated Extremities: no cyanosis, clubbing, rash, edema Neuro: alert & orientedx3, cranial nerves grossly intact. moves all 4 extremities w/o difficulty. Affect pleasant   Telemetry   A paced 60s personally reviewed.   EKG    N/A  Labs   Basic Metabolic Panel: Recent Labs  Lab 10/14/17 0204 10/15/17 0257  NA 135 137  K 4.7 4.3  CL 106 111  CO2 21* 19*  GLUCOSE 121* 102*  BUN 54* 44*  CREATININE 1.36* 1.17  CALCIUM 8.8* 8.1*    Liver Function Tests: No results for input(s): AST, ALT, ALKPHOS, BILITOT, PROT, ALBUMIN in the last 168  hours. No results for input(s): LIPASE, AMYLASE in the last 168 hours. No results for input(s): AMMONIA in the last 168 hours.  CBC: Recent Labs  Lab 10/14/17 0204 10/14/17 0819 10/14/17 1836 10/15/17 0257  WBC 9.2 9.2 7.2 7.2  HGB 6.5* 7.6* 8.6* 8.0*  HCT 20.4* 23.5* 26.1* 24.4*  MCV 95.3 95.1 92.2 91.7  PLT 229 179 174 168    INR: Recent Labs  Lab 10/14/17 0204 10/14/17 0819 10/15/17 0257  INR 3.93 3.75 4.06*    Other results:     Imaging    No results found.   Medications:     Scheduled Medications: . amiodarone  200 mg Oral Daily  . atorvastatin  80 mg Oral q1800  . levothyroxine  150 mcg Oral QAC breakfast  . pantoprazole  40 mg Oral BID  . ranolazine  500 mg Oral BID     Infusions:   PRN Medications:  acetaminophen, ondansetron (ZOFRAN) IV   Patient Profile    [name] is a 76 yo male with h/o VT, PAD and severe systolic HF (EF 37-62%) due to mixed ischemic/nonischemic CM he is s/p HM II LVAD implant for destination therapy on October 19, 2013.VAD admitted for recurrent GI bleed with hgb 6.5     Assessment/Plan:   1. Syncope due to acute GI bleed and symptomatic anemia Received 4UPRBCs. Hgb drifting down 8.6>8.  - hold coumadin and  Plavix (on for CVA). INR 4.1. Give 1FFP -Dr Havery Moros appreciated. Plan scope once INR down.  - has h/o s/p clipping of 2 jejunal AVMs in 4/16.  - Admitted 4/18 with recurrent GIB. Transfused 2u. EGD/colon negative  2. Chronic systolic HF: s/p HM-II LVAD 10/2014. Remains NYHA II.  -- LVAD speed reduced to 8800 in 10/17 due to RV failure. - s/p previous driveline repair. No further pump stops  -No low flows over night  3. Atrial arrhythmias: Atrial fibrillation, atrial tachycardia. S/p DC-CV 06/04/14, DC-CV 03/02/15 and 01/19/16.  - Continue amio 200 mg daily.  4. HTN:  - Maps stable.  5. LVAD:  - VAD interrogated personally.See above -LDH stable.  6. CKD, stage III:  - Stable.  Creatinine 1.2.   7. Anticoagulation management:  - Goal INR 2-2.5 - INR 4.1  - He is off ASA with GI bleeding. .. - Hold warfarin and Plavix with acute bleed.  - Give 1UFFP.  8. 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 S/p R CEA 01/13/16.  - Continue atorva.  - Hold plavix.  9. H/o amio-induced hypethyroidism:  - Followed by Dr. Forde Dandy in past.  - Amio restarted due to recurrent atrial arrhythmias and VT.  - Recent Free T4 elevated. Amio cut back to 200 daily. Decrease synthroid from 200 daily to 150 daily.  - Wil need f/u with Dr. Monna Fam if unable to see Dr. Forde Dandy 10 .   VT - underwent DC-CV 9/17.  - repeat episode of slow VT 08/17/17. Requiring DC-CV. Amio increased for 1 week To 400 bid. - Continue amio 200 mg daily.  - Keep K > 4.0 Mg > 2.0   I reviewed the LVAD parameters from today, and compared the results to the patient's prior recorded data.  No programming changes were made.  The LVAD is functioning within specified parameters.  The patient performs LVAD self-test daily.  LVAD interrogation was negative for any significant power changes, alarms or PI events/speed drops.  LVAD equipment check completed and is in good working order.  Back-up equipment present.   LVAD education done on emergency procedures and precautions and reviewed exit site care.  Length of Stay: 1  Amy Clegg, NP 10/15/2017, 7:32 AM  VAD Team --- VAD ISSUES ONLY--- Pager [phone] (7am - 7am)  Advanced Heart Failure Team  Pager [phone] (M-F; 7a - 4p)  Please contact [facility] for night-coverage after hours (4p -7a ) and weekends on [email or web address]  Patient seen and examined with Darrick Grinder, NP. We discussed all aspects of the encounter. I agree with the assessment and plan as stated above.   He continues to bleed. Hgb continues to drop despite RBCs. INR 4.1. Will give 1u FFP and 1 more RBC today. Continue IV protonix. VAD parameters stable. D/w GI at the bedside. Plan scope when INR  down or if he bleeding intensifies.   Glori Bickers, MD  8:33 AM

## 2017-10-15 NOTE — Progress Notes (Signed)
D/w ICU nursing staff  About  INR  Being 4.2 No active  Bleeding. INR will be  Repeated at 9 am  FFP not  Given

## 2017-10-15 NOTE — Progress Notes (Signed)
ANTICOAGULATION CONSULT NOTE - Follow Up Consult  Pharmacy Consult for Warfarin Indication: LVAD  Allergies  Allergen Reactions  . Demerol [Meperidine] Other (See Comments)    Paralysis. Could only move eyes.     Patient Measurements: Height: 6' (182.9 cm) Weight: 197 lb 8 oz (89.6 kg) IBW/kg (Calculated) : 77.6   Vital Signs: Temp: 98.2 F (36.8 C) (12/11 0758) Temp Source: Oral (12/11 0758) BP: 100/77 (12/11 0400) Pulse Rate: 63 (12/11 0400)  Labs: Recent Labs    10/14/17 0204 10/14/17 0819 10/14/17 1836 10/15/17 0257  HGB 6.5* 7.6* 8.6* 8.0*  HCT 20.4* 23.5* 26.1* 24.4*  PLT 229 179 174 168  LABPROT 38.2* 36.8*  --  39.1*  INR 3.93 3.75  --  4.06*  CREATININE 1.36*  --   --  1.17    Estimated Creatinine Clearance: 58 mL/min (by C-G formula based on SCr of 1.17 mg/dL).   Medical History: Past Medical History:  Diagnosis Date  . Arthritis   . Automatic implantable cardioverter-defibrillator in situ    a. s/p Medtronic Evera device serial [id number]    . Bradycardia   . CAD (coronary artery disease)    a. s/p MI 1982;  s/p DES to CFX 2011;  s/p NSTEMI 4/12 in setting of VTach;  cath 7/12:  LM ok, LAD mid 30-40%, Dx's with 40%, mCFX stent patent, prox to mid RCA occluded with R to R and L to R collats.  Medical Tx was continued  . Chronic systolic heart failure ([place])    a. s/p LVAD 10/2013 for destination therapy  . CKD (chronic kidney disease)   . CVD (cerebrovascular disease)    a. s/p L CEA 2008  . Cytopenia   . Dysrhythmia    AFIB  . History of GI bleed    a. on ASA- started on plavix during 10/2015 admission for CVA  . HLD (hyperlipidemia)   . HTN (hypertension)   . Hypothyroidism   . Inappropriate therapy from implantable cardioverter-defibrillator    a. ATP for sinus tach SVT wavelet identified SVT but was no  passive (2014)  . Ischemic cardiomyopathy   . 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 L Ear   . Myocardial infarction ([place])    1992  . PAF  (paroxysmal atrial fibrillation) ([place])   . PVD (peripheral vascular disease) ([place])    a. 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 Shortness of breath dyspnea    W/ EXERTION   . Sleep apnea    NO CPAP NOW  HE SENT IT BACK YRS AGO  . Stroke [facility])    a. 10/2015 admission   . Ventricular tachycardia ([place])    a. s/p AICD;   h/o ICD shock;  Amiodarone Rx. S/P ablation in 2012      Assessment: 75yom with LVAD HM2 admitted with GIB, h/h 6.5/20.4 INR 3.9>4.2 despite holding warfarin.  H/H still drifting down despite 4 PRBC - give 2 more uts today. Plan to hold warfarin until after endoscopy. GI consult- ppi po bid    Goal of Therapy:  INR 2-2.5 ( may change with new GIB - will discuss with MD) Monitor platelets by anticoagulation protocol: Yes   Plan:  Holding warfarin for now F/u drop in INR and GI plan  Bonnita Nasuti Pharm.D. CPP, BCPS Clinical Pharmacist [phone] 10/15/2017 8:46  AM    

## 2017-10-15 NOTE — Progress Notes (Signed)
Pt MAP at 2000 was 110.  VAD coordinator called.  MD made aware.  Orders for hydralazine 10 mg IV push given if MAP on cuff correlates with MAP on doppler.  MAPs did not correlate.  NO hydralazine given. RN will continue to monitor patient.

## 2017-10-15 NOTE — Progress Notes (Signed)
CRITICAL VALUE ALERT  Critical Value:  INR 4.6  Date & Time Notied:  10/15/2017 0350, no call back    10/15/2017 0430 Called number instead of paging  Provider Notified: Dr.Vedre  Orders Received/Actions taken: Repeat INR at 0900

## 2017-10-16 ENCOUNTER — Telehealth (HOSPITAL_COMMUNITY): Payer: Self-pay | Admitting: *Deleted

## 2017-10-16 LAB — BASIC METABOLIC PANEL
ANION GAP: 6 (ref 5–15)
ANION GAP: 6 (ref 5–15)
BUN: 38 mg/dL — AB (ref 6–20)
BUN: 31 mg/dL — ABNORMAL HIGH (ref 6–20)
CHLORIDE: 111 mmol/L (ref 101–111)
CO2: 21 mmol/L — AB (ref 22–32)
CO2: 21 mmol/L — ABNORMAL LOW (ref 22–32)
Calcium: 8 mg/dL — ABNORMAL LOW (ref 8.9–10.3)
Calcium: 8.2 mg/dL — ABNORMAL LOW (ref 8.9–10.3)
Chloride: 111 mmol/L (ref 101–111)
Creatinine, Ser: 1.21 mg/dL (ref 0.61–1.24)
Creatinine, Ser: 1.22 mg/dL (ref 0.61–1.24)
GFR calc Af Amer: >60 mL/min (ref >60)
GFR calc non Af Amer: 56 mL/min — ABNORMAL LOW (ref >60)
GFR, EST NON AFRICAN AMERICAN: 55 mL/min — AB (ref >60)
GLUCOSE: 111 mg/dL — AB (ref 65–99)
Glucose, Bld: 113 mg/dL — ABNORMAL HIGH (ref 65–99)
POTASSIUM: 3.8 mmol/L (ref 3.5–5.1)
POTASSIUM: 3.9 mmol/L (ref 3.5–5.1)
SODIUM: 138 mmol/L (ref 135–145)
Sodium: 138 mmol/L (ref 135–145)

## 2017-10-16 LAB — PROTIME-INR
INR: 2.89
INR: 2.44
Prothrombin Time: 30 seconds — ABNORMAL HIGH (ref 11.4–15.2)
Prothrombin Time: 26.3 seconds — ABNORMAL HIGH (ref 11.4–15.2)

## 2017-10-16 LAB — BPAM FFP
BLOOD PRODUCT EXPIRATION DATE: 201812112359
ISSUE DATE / TIME: 201812110820
UNIT TYPE AND RH: 6200

## 2017-10-16 LAB — PREPARE FRESH FROZEN PLASMA: Unit division: 0

## 2017-10-16 LAB — CBC
HEMATOCRIT: 25.1 % — AB (ref 39.0–52.0)
HEMATOCRIT: 26.5 % — AB (ref 39.0–52.0)
HEMOGLOBIN: 8.5 g/dL — AB (ref 13.0–17.0)
Hemoglobin: 8.3 g/dL — ABNORMAL LOW (ref 13.0–17.0)
MCH: 30.3 pg (ref 26.0–34.0)
MCH: 30.1 pg (ref 26.0–34.0)
MCHC: 33.1 g/dL (ref 30.0–36.0)
MCHC: 32.1 g/dL (ref 30.0–36.0)
MCV: 91.6 fL (ref 78.0–100.0)
MCV: 94 fL (ref 78.0–100.0)
Platelets: 159 10*3/uL (ref 150–400)
Platelets: 196 10*3/uL (ref 150–400)
RBC: 2.74 MIL/uL — AB (ref 4.22–5.81)
RBC: 2.82 MIL/uL — AB (ref 4.22–5.81)
RDW: 19 % — AB (ref 11.5–15.5)
RDW: 19.4 % — AB (ref 11.5–15.5)
WBC: 6.9 10*3/uL (ref 4.0–10.5)
WBC: 7.3 10*3/uL (ref 4.0–10.5)

## 2017-10-16 LAB — LACTATE DEHYDROGENASE
LDH: 215 U/L — ABNORMAL HIGH (ref 98–192)
LDH: 256 U/L — AB (ref 98–192)

## 2017-10-16 MED ORDER — METOPROLOL SUCCINATE ER 25 MG PO TB24
12.5000 mg | ORAL_TABLET | Freq: Every day | ORAL | Status: DC
  Administered 2017-10-16 – 2017-10-22 (×7): 12.5 mg via ORAL
  Filled 2017-10-16 (×7): qty 1

## 2017-10-16 NOTE — Progress Notes (Signed)
Advanced Heart Failure VAD Team Note  Subjective:    Admitted with symptomatic anemia. Hgb on admit 6.5. Coumadin held. GI consulted.   Yesterday received 1 unit FFP & 1u RBC. Had 2 black BMs.  hgb up to 8.6 -> 8.3 INR 2.89  Denies SOB/dizziness. .  BP slightly elevated  LVAD INTERROGATION:  HeartMate II LVAD:  Flow 3.6 liters/min, speed 8800 , power 4.5 PI 8.2 3 PI events 1 low flow.    Objective:    Vital Signs:   Temp:  [98.3 F (36.8 C)-98.9 F (37.2 C)] 98.7 F (37.1 C) (12/12 0332) Pulse Rate:  [60-76] 60 (12/12 0600) Resp:  [11-19] 12 (12/12 0600) BP: (89-106)/(73-87) 106/87 (12/12 0600) SpO2:  [94 %-100 %] 96 % (12/12 0600) Weight:  [192 lb 14.4 oz (87.5 kg)] 192 lb 14.4 oz (87.5 kg) (12/12 0600) Last BM Date: 10/15/17 Mean arterial Pressure 80-90s     Intake/Output:   Intake/Output Summary (Last 24 hours) at 10/16/2017 0820 Last data filed at 10/16/2017 0600 Gross per 24 hour  Intake 1637.58 ml  Output 1801 ml  Net -163.42 ml     Physical Exam    Physical Exam: GENERAL: NAD in the chair. Pale HEENT: normal anicteric NECK: Supple, JVP 5-6 .  2+ bilaterally, no bruits.  No lymphadenopathy or thyromegaly appreciated.   CARDIAC:  Mechanical heart sounds with LVAD hum present.  LUNGS:  Clear to auscultation bilaterally.  ABDOMEN:  Soft, round, nontender, positive bowel sounds x4.     LVAD exit site: well-healed and incorporated.  Dressing dry and intact.  No erythema or drainage.  Stabilization device present and accurately applied.  Driveline dressing is being changed daily per sterile technique. EXTREMITIES:  Warm and dry, no cyanosis, clubbing, rash or edema  NEUROLOGIC:  Alert and oriented x 4.  Gait steady.  No aphasia.  No dysarthria.  Affect pleasant.      Telemetry  A paced 60s personally reviewed.   EKG    N/A  Labs   Basic Metabolic Panel: Recent Labs  Lab 10/14/17 0204 10/15/17 0257 10/16/17 0300  NA 135 137 138  K 4.7 4.3 3.8  CL  106 111 111  CO2 21* 19* 21*  GLUCOSE 121* 102* 111*  BUN 54* 44* 38*  CREATININE 1.36* 1.17 1.21  CALCIUM 8.8* 8.1* 8.0*    Liver Function Tests: No results for input(s): AST, ALT, ALKPHOS, BILITOT, PROT, ALBUMIN in the last 168 hours. No results for input(s): LIPASE, AMYLASE in the last 168 hours. No results for input(s): AMMONIA in the last 168 hours.  CBC: Recent Labs  Lab 10/14/17 0819 10/14/17 1836 10/15/17 0257 10/15/17 2118 10/16/17 0300  WBC 9.2 7.2 7.2 7.9 6.9  HGB 7.6* 8.6* 8.0* 8.6* 8.3*  HCT 23.5* 26.1* 24.4* 26.3* 25.1*  MCV 95.1 92.2 91.7 91.6 91.6  PLT 179 174 168 177 159    INR: Recent Labs  Lab 10/14/17 0204 10/14/17 0819 10/15/17 0257 10/15/17 1447 10/16/17 0300  INR 3.93 3.75 4.06* 2.86 2.89    Other results:     Imaging   No results found.   Medications:     Scheduled Medications: . amiodarone  200 mg Oral Daily  . atorvastatin  80 mg Oral q1800  . hydrALAZINE  10 mg Intravenous Once  . levothyroxine  150 mcg Oral QAC breakfast  . pantoprazole  40 mg Oral BID  . ranolazine  500 mg Oral BID    Infusions:   PRN Medications: acetaminophen,  ondansetron [facility]) IV   Patient Profile    [name] is a 76 yo male with h/o VT, PAD and severe systolic HF (EF 99-37%) due to mixed ischemic/nonischemic CM he is s/p HM II LVAD implant for destination therapy on October 19, 2013.VAD admitted for recurrent GI bleed with hgb 6.5     Assessment/Plan:   1. Syncope due to acute GI bleed and symptomatic anemia Received 4UPRBCs.and 1FFP Hgb drifting down 8.6>8>8.3   - hold coumadin and Plavix (on for CVA). INR 2.8  -Plan for EGD tomorrow at [place]  -Dr Havery Moros appreciated.  - has h/o s/p clipping of 2 jejunal AVMs in 4/16.  - Admitted 4/18 EGD/colon negative 2. Chronic systolic HF: s/p HM-II LVAD 10/2014. Remains NYHA II.  -- LVAD speed reduced to 8800 in 10/17 due to RV failure. - s/p previous driveline repair. No  further pump stops 3. Atrial arrhythmias: Atrial fibrillation, atrial tachycardia. S/p DC-CV 06/04/14, DC-CV 03/02/15 and 01/19/16.  - Continue amio 200 mg daily.  4. HTN:  -Maps elevated. Restart 12.5 mg toprol xl daily. Prior to admit on Toprol XL 25 mg twice a day.      5. LVAD:  -VAD interrogated personally. Parameters stable. -LDH stable.  6. CKD, stage III:  - Stable.  - Creatinine 1.2 7. Anticoagulation management:  - Goal INR 2-2.5 - INR 2.89  - He is off ASA with GI bleeding. .. - Hold warfarin and Plavix with acute bleed.  8. 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 S/p R CEA 01/13/16.  - Continue atorva.  - Hold plavix.  9. H/o amio-induced hypethyroidism:  - Followed by Dr. Forde Dandy in past.  - Amio restarted due to recurrent atrial arrhythmias and VT.  - Recent Free T4 elevated. Amio cut back to 200 daily. Decrease synthroid from 200 daily to 150 daily.  - Wil need f/u with Dr. Monna Fam if unable to see Dr. Forde Dandy 10 .   VT - underwent DC-CV 9/17.  - repeat episode of slow VT 08/17/17. Requiring DC-CV. Amio increased for 1 week To 400 bid. - Continue amio 200 mg daily.  - Keep K > 4.0 Mg > 2.0   I reviewed the LVAD parameters from today, and compared the results to the patient's prior recorded data.  No programming changes were made.  The LVAD is functioning within specified parameters.  The patient performs LVAD self-test daily.  LVAD interrogation was negative for any significant power changes, alarms or PI events/speed drops.  LVAD equipment check completed and is in good working order.  Back-up equipment present.   LVAD education done on emergency procedures and precautions and reviewed exit site care.  Length of Stay: 2  Darrick Grinder, NP 10/16/2017, 8:20 AM  VAD Team --- VAD ISSUES ONLY--- Pager [phone] (7am - 7am)  Advanced Heart Failure Team  Pager [phone] (M-F; 7a - 4p)  Please contact [facility] for night-coverage after hours (4p -7a )  and weekends on [email or web address]   Patient seen and examined with Darrick Grinder, NP. We discussed all aspects of the encounter. I agree with the assessment and plan as stated above.   He appears to continue to bleed slowly. Hgb rise much less than expected with transfusion. Yesterday received 1u FFP for INR 4.0 seems to have slowed. Will follow hgb closely today. Transfuse more as needed. D/w GI. Possible enteroscopy tomorrow at [place]. VAD interrogated personally. Parameters stable.. BP elevated. Agree with adding back b-blocker.   Glori Bickers, MD  9:04 AM

## 2017-10-16 NOTE — H&P (View-Only) (Signed)
      Progress Note   Subjective  Patient had a black bowel movement around 10 AM yesterday. Otherwise tolerating diet, no pain. Had one unit of FFP and one unit PRBC. Hgb remains in 8s, while INR has gone from 4s to 2.8 range.    Objective   Vital signs in last 24 hours: Temp:  [98.3 F (36.8 C)-98.9 F (37.2 C)] 98.7 F (37.1 C) (12/12 0332) Pulse Rate:  [60-76] 60 (12/12 0600) Resp:  [11-19] 12 (12/12 0600) BP: (89-106)/(73-87) 106/87 (12/12 0600) SpO2:  [94 %-100 %] 96 % (12/12 0600) Weight:  [192 lb 14.4 oz (87.5 kg)] 192 lb 14.4 oz (87.5 kg) (12/12 0600) Last BM Date: 10/15/17 General:    white male in NAD Heart:  Regular rate and rhythm; LVAD Lungs: Respirations even and unlabored, lungs CTA bilaterally Abdomen:  Soft, nontender and nondistended. Normal bowel sounds. Extremities:  Without edema. Neurologic:  Alert and oriented,  grossly normal neurologically. Psych:  Cooperative. Normal mood and affect.  Intake/Output from previous day: 12/11 0701 - 12/12 0700 In: 1757.6 [P.O.:850; Blood:907.6] Out: 2351 [Urine:2350; Stool:1] Intake/Output this shift: No intake/output data recorded.  Lab Results: Recent Labs    10/15/17 0257 10/15/17 2118 10/16/17 0300  WBC 7.2 7.9 6.9  HGB 8.0* 8.6* 8.3*  HCT 24.4* 26.3* 25.1*  PLT 168 177 159   BMET Recent Labs    10/14/17 0204 10/15/17 0257 10/16/17 0300  NA 135 137 138  K 4.7 4.3 3.8  CL 106 111 111  CO2 21* 19* 21*  GLUCOSE 121* 102* 111*  BUN 54* 44* 38*  CREATININE 1.36* 1.17 1.21  CALCIUM 8.8* 8.1* 8.0*   LFT No results for input(s): PROT, ALBUMIN, AST, ALT, ALKPHOS, BILITOT, BILIDIR, IBILI in the last 72 hours. PT/INR Recent Labs    10/15/17 1447 10/16/17 0300  LABPROT 29.8* 30.0*  INR 2.86 2.89    Studies/Results: No results found.     Assessment / Plan:   76 y/o male with a history of LVAD in place with history of CAD with DES, on coumadin and Plavix, admitted with suspected upper GI  Bleeding. 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  To this point he's had multiple units of PRBC transfused, Hgb maintaining around 8. It's possible the dark bowel movement he passed yesterday is old blood, as his BUN is downtrending and a good sign.   I would prefer his INR to be lower prior to enteroscopy, especially in the setting of recent Plavix use, if he is otherwise hemodynamically stable without significant bleeding. Will hold off on enteroscopy today, and tentatively schedule for tomorrow morning if his INR downtrends further. He can resume diet now, but please keep him NPO after MN however in case he is ready tomorrow. If he has significant bleeding in the interim, please contact me. Moving forward, if enteroscopy is unremarkable, will proceed with a capsule study. Hopefully if his INR can be kept at lowest acceptable range for his LVAD, this will minimize his bleeding risk.   Call with questions.  Eldora Cellar, MD [facility] Pager [phone]

## 2017-10-16 NOTE — Progress Notes (Signed)
      Progress Note   Subjective  Patient had a black bowel movement around 10 AM yesterday. Otherwise tolerating diet, no pain. Had one unit of FFP and one unit PRBC. Hgb remains in 8s, while INR has gone from 4s to 2.8 range.    Objective   Vital signs in last 24 hours: Temp:  [98.3 F (36.8 C)-98.9 F (37.2 C)] 98.7 F (37.1 C) (12/12 0332) Pulse Rate:  [60-76] 60 (12/12 0600) Resp:  [11-19] 12 (12/12 0600) BP: (89-106)/(73-87) 106/87 (12/12 0600) SpO2:  [94 %-100 %] 96 % (12/12 0600) Weight:  [192 lb 14.4 oz (87.5 kg)] 192 lb 14.4 oz (87.5 kg) (12/12 0600) Last BM Date: 10/15/17 General:    white male in NAD Heart:  Regular rate and rhythm; LVAD Lungs: Respirations even and unlabored, lungs CTA bilaterally Abdomen:  Soft, nontender and nondistended. Normal bowel sounds. Extremities:  Without edema. Neurologic:  Alert and oriented,  grossly normal neurologically. Psych:  Cooperative. Normal mood and affect.  Intake/Output from previous day: 12/11 0701 - 12/12 0700 In: 1757.6 [P.O.:850; Blood:907.6] Out: 2351 [Urine:2350; Stool:1] Intake/Output this shift: No intake/output data recorded.  Lab Results: Recent Labs    10/15/17 0257 10/15/17 2118 10/16/17 0300  WBC 7.2 7.9 6.9  HGB 8.0* 8.6* 8.3*  HCT 24.4* 26.3* 25.1*  PLT 168 177 159   BMET Recent Labs    10/14/17 0204 10/15/17 0257 10/16/17 0300  NA 135 137 138  K 4.7 4.3 3.8  CL 106 111 111  CO2 21* 19* 21*  GLUCOSE 121* 102* 111*  BUN 54* 44* 38*  CREATININE 1.36* 1.17 1.21  CALCIUM 8.8* 8.1* 8.0*   LFT No results for input(s): PROT, ALBUMIN, AST, ALT, ALKPHOS, BILITOT, BILIDIR, IBILI in the last 72 hours. PT/INR Recent Labs    10/15/17 1447 10/16/17 0300  LABPROT 29.8* 30.0*  INR 2.86 2.89    Studies/Results: No results found.     Assessment / Plan:   76 y/o male with a history of LVAD in place with history of CAD with DES, on coumadin and Plavix, admitted with suspected upper GI  Bleeding. 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  To this point he's had multiple units of PRBC transfused, Hgb maintaining around 8. It's possible the dark bowel movement he passed yesterday is old blood, as his BUN is downtrending and a good sign.   I would prefer his INR to be lower prior to enteroscopy, especially in the setting of recent Plavix use, if he is otherwise hemodynamically stable without significant bleeding. Will hold off on enteroscopy today, and tentatively schedule for tomorrow morning if his INR downtrends further. He can resume diet now, but please keep him NPO after MN however in case he is ready tomorrow. If he has significant bleeding in the interim, please contact me. Moving forward, if enteroscopy is unremarkable, will proceed with a capsule study. Hopefully if his INR can be kept at lowest acceptable range for his LVAD, this will minimize his bleeding risk.   Call with questions.  [place] Cellar, MD [facility] Pager [phone]

## 2017-10-16 NOTE — Telephone Encounter (Signed)
Late entry- Received page from [place], [place] who stated patients doppler pressure was 110. Patient asymptomatic. No VAD alarms noted. Personally called Dr Haroldine Laws who gave a one-time order for IV Hydralazine pending if  Doppler MAP reflected automatic cuff MAP. Instructions given to [facility] to verify if doppler MAP correlates with autmatic cuff MAP. If it correlates with cuff systolic, do not give hydralazine. CBC to be repeated.  Balinda Quails RN, VAD Coordinator 24/7 pager [phone]

## 2017-10-16 NOTE — Progress Notes (Signed)
LVAD Coordinator Rounding Note:  Admitted 10/14/17 due to GIB. Hgb 6.5 on arrival.  HM II LVAD implated on 10/19/13 by Dr. Cyndia Bent under Destination Therapy criteria.  Sitting in chair this morning without complaints.  Vital signs: HR:68 Doppler Pressure:90 Automatic BP:106/85 (94) O2 Sat: 98 Wt: 199>200>198>197>192 lbs   LVAD interrogation reveals:  Speed: 8800 Flow: 3.6 Power:4.5 PI: 8.2 Alarms:1 Low Flow alarm Events:3 PI events  Fixed speed: 8800 Low speed limit: 8200   Drive Line: Weekly dressing kit. Due 12/16. On exam, dressing CDI with anchor present.  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 Call VAD pager for any equipment needs. 2. VAD Coordinator will accompany patient when endo is scheduled   Balinda Quails RN, [place] Coordinator 24/7 pager [phone]

## 2017-10-16 NOTE — Plan of Care (Signed)
Pt progressing as expected, labs improving. Scheduled for EGD tomorrow morning if INR okay, will be NPO after midnight.

## 2017-10-17 ENCOUNTER — Encounter (HOSPITAL_COMMUNITY): Payer: Self-pay

## 2017-10-17 ENCOUNTER — Inpatient Hospital Stay (HOSPITAL_COMMUNITY): Payer: Medicare Other | Admitting: Certified Registered"

## 2017-10-17 ENCOUNTER — Encounter (HOSPITAL_COMMUNITY)
Admission: EM | Disposition: A | Discharge status: Home / Self Care | Payer: Self-pay | Source: Home / Self Care | Attending: Internal Medicine

## 2017-10-17 DIAGNOSIS — K5520 Angiodysplasia of colon without hemorrhage: Secondary | ICD-10-CM

## 2017-10-17 HISTORY — PX: ENTEROSCOPY: SHX5533

## 2017-10-17 LAB — TYPE AND SCREEN
ABO/RH(D): O POS
ANTIBODY SCREEN: NEGATIVE
UNIT DIVISION: 0
UNIT DIVISION: 0
UNIT DIVISION: 0
UNIT DIVISION: 0
Unit division: 0
Unit division: 0

## 2017-10-17 LAB — BASIC METABOLIC PANEL
Anion gap: 6 (ref 5–15)
BUN: 25 mg/dL — AB (ref 6–20)
CHLORIDE: 110 mmol/L (ref 101–111)
CO2: 24 mmol/L (ref 22–32)
Calcium: 8.2 mg/dL — ABNORMAL LOW (ref 8.9–10.3)
Creatinine, Ser: 1.19 mg/dL (ref 0.61–1.24)
GFR calc Af Amer: >60 mL/min (ref >60)
GFR calc non Af Amer: 57 mL/min — ABNORMAL LOW (ref >60)
GLUCOSE: 106 mg/dL — AB (ref 65–99)
POTASSIUM: 4.2 mmol/L (ref 3.5–5.1)
Sodium: 140 mmol/L (ref 135–145)

## 2017-10-17 LAB — BPAM RBC
BLOOD PRODUCT EXPIRATION DATE: 201812312359
Blood Product Expiration Date: 201812262359
Blood Product Expiration Date: 201812262359
Blood Product Expiration Date: 201812262359
Blood Product Expiration Date: 201812312359
Blood Product Expiration Date: 201812312359
ISSUE DATE / TIME: 201812101110
ISSUE DATE / TIME: 201812111111
ISSUE DATE / TIME: 201812100329
ISSUE DATE / TIME: 201812100455
ISSUE DATE / TIME: 201812101317
UNIT TYPE AND RH: 5100
UNIT TYPE AND RH: 5100
UNIT TYPE AND RH: 5100
UNIT TYPE AND RH: 5100
Unit Type and Rh: 5100
Unit Type and Rh: 5100

## 2017-10-17 LAB — CBC
HCT: 26.3 % — ABNORMAL LOW (ref 39.0–52.0)
HEMOGLOBIN: 8.4 g/dL — AB (ref 13.0–17.0)
MCH: 30 pg (ref 26.0–34.0)
MCHC: 31.9 g/dL (ref 30.0–36.0)
MCV: 93.9 fL (ref 78.0–100.0)
PLATELETS: 185 10*3/uL (ref 150–400)
RBC: 2.8 MIL/uL — AB (ref 4.22–5.81)
RDW: 19.4 % — ABNORMAL HIGH (ref 11.5–15.5)
WBC: 6.7 10*3/uL (ref 4.0–10.5)

## 2017-10-17 LAB — GLUCOSE, CAPILLARY
Glucose-Capillary: 112 mg/dL — ABNORMAL HIGH (ref 65–99)
Glucose-Capillary: 109 mg/dL — ABNORMAL HIGH (ref 65–99)

## 2017-10-17 LAB — LACTATE DEHYDROGENASE: LDH: 239 U/L — AB (ref 98–192)

## 2017-10-17 LAB — PROTIME-INR
INR: 2.19
Prothrombin Time: 24.1 seconds — ABNORMAL HIGH (ref 11.4–15.2)

## 2017-10-17 SURGERY — ENTEROSCOPY
Anesthesia: Monitor Anesthesia Care

## 2017-10-17 MED ORDER — PHENYLEPHRINE HCL 10 MG/ML IJ SOLN
INTRAMUSCULAR | Status: DC | PRN
  Administered 2017-10-17: 15 ug/min via INTRAVENOUS

## 2017-10-17 MED ORDER — PROPOFOL 500 MG/50ML IV EMUL
INTRAVENOUS | Status: DC | PRN
  Administered 2017-10-17: 100 ug/kg/min via INTRAVENOUS

## 2017-10-17 MED ORDER — ALBUMIN HUMAN 5 % IV SOLN
INTRAVENOUS | Status: DC | PRN
  Administered 2017-10-17: 10:00:00 via INTRAVENOUS

## 2017-10-17 MED ORDER — WARFARIN - PHARMACIST DOSING INPATIENT: Freq: Every day | Status: DC

## 2017-10-17 MED ORDER — WARFARIN SODIUM 3 MG PO TABS
6.0000 mg | ORAL_TABLET | Freq: Once | ORAL | Status: AC
  Administered 2017-10-17: 6 mg via ORAL
  Filled 2017-10-17: qty 2

## 2017-10-17 MED ORDER — LACTATED RINGERS IV SOLN
INTRAVENOUS | Status: DC | PRN
Start: 2017-10-17 — End: 2017-10-17
  Administered 2017-10-17: 09:00:00 via INTRAVENOUS

## 2017-10-17 MED ORDER — BUTAMBEN-TETRACAINE-BENZOCAINE 2-2-14 % EX AERO
INHALATION_SPRAY | CUTANEOUS | Status: DC | PRN
  Administered 2017-10-17: 1 via TOPICAL

## 2017-10-17 MED ORDER — PROPOFOL 10 MG/ML IV BOLUS
INTRAVENOUS | Status: DC | PRN
  Administered 2017-10-17: 10 mg via INTRAVENOUS

## 2017-10-17 NOTE — Transfer of Care (Signed)
Immediate Anesthesia Transfer of Care Note  Patient: [name]  Procedure(s) Performed: ENTEROSCOPY (N/A )  Patient Location: Endoscopy Unit  Anesthesia Type:MAC  Level of Consciousness: awake and alert   Airway & Oxygen Therapy: Patient Spontanous Breathing and Patient connected to nasal cannula oxygen  Post-op Assessment: Report given to RN and Post -op Vital signs reviewed and stable  Post vital signs: Reviewed and stable  Last Vitals:  Vitals:   10/17/17 1000 10/17/17 1003  BP:  91/71  Pulse:  61  Resp:  13  Temp: 36.7 C   SpO2:  98%    Last Pain:  Vitals:   10/17/17 1000  TempSrc: Oral  PainSc:          Complications: No apparent anesthesia complications

## 2017-10-17 NOTE — Anesthesia Procedure Notes (Signed)
Procedure Name: MAC Date/Time: 10/17/2017 9:10 AM Performed by: Wilburn Cornelia, CRNA Pre-anesthesia Checklist: Patient identified, Emergency Drugs available, Suction available, Patient being monitored and Timeout performed Patient Re-evaluated:Patient Re-evaluated prior to induction Oxygen Delivery Method: Circle system utilized Preoxygenation: Pre-oxygenation with 100% oxygen Induction Type: IV induction Placement Confirmation: positive ETCO2 and breath sounds checked- equal and bilateral Dental Injury: Teeth and Oropharynx as per pre-operative assessment

## 2017-10-17 NOTE — Anesthesia Postprocedure Evaluation (Signed)
Anesthesia Post Note  Patient: [name]  Procedure(s) Performed: ENTEROSCOPY (N/A )     Patient location during evaluation: PACU Anesthesia Type: MAC Level of consciousness: awake and alert Pain management: pain level controlled Vital Signs Assessment: post-procedure vital signs reviewed and stable Respiratory status: spontaneous breathing, nonlabored ventilation, respiratory function stable and patient connected to nasal cannula oxygen Cardiovascular status: stable and blood pressure returned to baseline Postop Assessment: no apparent nausea or vomiting Anesthetic complications: no    Last Vitals:  Vitals:   10/17/17 1000 10/17/17 1003  BP:  91/71  Pulse: 67 61  Resp: 17 13  Temp: 36.7 C   SpO2: 98% 98%    Last Pain:  Vitals:   10/17/17 1000  TempSrc: Oral  PainSc:                  Taunia Frasco DAVID

## 2017-10-17 NOTE — Progress Notes (Signed)
LVAD Coordinator Rounding Note:  Admitted 10/14/17 due to GIB. Hgb 6.5 on arrival.  HM II LVAD implated on 10/19/13 by Dr. Cyndia Bent under Destination Therapy criteria.  Pt NPO for upcoming endoscopy today.  Vital signs: Temp:  98.1 HR:60 Doppler Pressure:90 Automatic BP:106/85 (94) O2 Sat:  100 % RA Wt: 199>197>192>196>196 lbs   LVAD interrogation reveals:  Speed: 8800 Flow: 4.0 Power:4.5w PI: 7.9 Alarms:none  Events:rare PI  Fixed speed: 8800 Low speed limit: 8200   Drive Line: Weekly dressing kit. Due 12/16. On exam, dressing CDI with anchor present.  Blood products this admission: 4 units/PRBC on 10/14/17 1 FFP, 1 PRBC 10/15/17  Hgb trend:  6.5>7.6>8.6>8.0>8.6>8.3>8.5>8.4  INR trend: 3.93>3.75>4.06>2.86>2.89>2.44>2.19  LDH trend: 279>235>215>256>239   Anticoagulation Plan: - INR Goal: 2.0 - 2.5 - ASA Dose: no ASA due to hx of GI bleed - Hx of TIA/CVA pt on Plavix - on hold for upcoming procedure  Device: - Medtronic dual -Therapies: on at 200 bpm  Adverse Events on VAD: 02/2015 >>GIB (removed ASA, 2-2.5 INR) 10/2015 >>CVA(Plavix started) 02/2017> GIB (removed ASA, scopes negative) 03/2017>percutaneous lead repair History of amiodarone induced-thyrotoxicosis 12/18>GIB with push enteroscopy; APC therapy on 2 small AVMs  Plan/Recommendations:  1. Call VAD pager for any equipment needs. 2. VAD Coordinator will accompany patient when endo is scheduled   Zada Girt RN, VAD Coordinator 24/7 pager [phone]

## 2017-10-17 NOTE — Progress Notes (Signed)
Advanced Heart Failure VAD Team Note  Subjective:    Admitted with symptomatic anemia. Hgb on admit 6.5. Coumadin held. GI consulted.   Received total 4UPRBCs.   Yesterday had 1 black BM.   Denies SOB/dizziness.   LVAD INTERROGATION:  HeartMate II LVAD:  Flow 3.8 liters/min, speed 8800 , power 4 PI 7.2  Objective:    Vital Signs:   Temp:  [98.4 F (36.9 C)-99 F (37.2 C)] 98.6 F (37 C) (12/13 0400) Pulse Rate:  [59] 59 (12/12 0800) Resp:  [12-22] 12 (12/13 0000) BP: (93-114)/(65-87) 96/76 (12/12 2200) SpO2:  [97 %-100 %] 97 % (12/13 0000) Weight:  [196 lb 6.9 oz (89.1 kg)] 196 lb 6.9 oz (89.1 kg) (12/13 0450) Last BM Date: 10/16/17 Mean arterial Pressure 80-90     Intake/Output:   Intake/Output Summary (Last 24 hours) at 10/17/2017 0741 Last data filed at 10/17/2017 0000 Gross per 24 hour  Intake 650 ml  Output 2301 ml  Net -1651 ml     Physical Exam     Physical Exam: GENERAL: Pale NAD  HEENT: normal  NECK: Supple, JVP 5-6   .  2+ bilaterally, no bruits.  No lymphadenopathy or thyromegaly appreciated.   CARDIAC:  Mechanical heart sounds with LVAD hum present.  LUNGS:  Clear to auscultation bilaterally.  ABDOMEN:  Soft, round, nontender, positive bowel sounds x4.     LVAD exit site:.  Dressing dry and intact.  No erythema or drainage.  Stabilization device present and accurately applied.  Driveline dressing is being changed daily per sterile technique. EXTREMITIES:  Warm and dry, no cyanosis, clubbing, rash or edema  NEUROLOGIC:  Alert and oriented x 4.  Gait steady.  No aphasia.  No dysarthria.  Affect pleasant.       Telemetry   A paced 60s with PVCs. Personally reviewed.   EKG    N/A  Labs   Basic Metabolic Panel: Recent Labs  Lab 10/14/17 0204 10/15/17 0257 10/16/17 0300 10/16/17 1855 10/17/17 0325  NA 135 137 138 138 140  K 4.7 4.3 3.8 3.9 4.2  CL 106 111 111 111 110  CO2 21* 19* 21* 21* 24  GLUCOSE 121* 102* 111* 113* 106*  BUN  54* 44* 38* 31* 25*  CREATININE 1.36* 1.17 1.21 1.22 1.19  CALCIUM 8.8* 8.1* 8.0* 8.2* 8.2*    Liver Function Tests: No results for input(s): AST, ALT, ALKPHOS, BILITOT, PROT, ALBUMIN in the last 168 hours. No results for input(s): LIPASE, AMYLASE in the last 168 hours. No results for input(s): AMMONIA in the last 168 hours.  CBC: Recent Labs  Lab 10/15/17 0257 10/15/17 2118 10/16/17 0300 10/16/17 1855 10/17/17 0325  WBC 7.2 7.9 6.9 7.3 6.7  HGB 8.0* 8.6* 8.3* 8.5* 8.4*  HCT 24.4* 26.3* 25.1* 26.5* 26.3*  MCV 91.7 91.6 91.6 94.0 93.9  PLT 168 177 159 196 185    INR: Recent Labs  Lab 10/15/17 0257 10/15/17 1447 10/16/17 0300 10/16/17 1855 10/17/17 0325  INR 4.06* 2.86 2.89 2.44 2.19    Other results:     Imaging   No results found.   Medications:     Scheduled Medications: . amiodarone  200 mg Oral Daily  . atorvastatin  80 mg Oral q1800  . hydrALAZINE  10 mg Intravenous Once  . levothyroxine  150 mcg Oral QAC breakfast  . metoprolol succinate  12.5 mg Oral Daily  . pantoprazole  40 mg Oral BID  . 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 Syncope due to acute GI bleed and symptomatic anemia Received 4UPRBCs.and 1FFP Hgb today 8.4  - Hold  hold coumadin and Plavix (on for CVA).  INR 2.19  -Plan for EGD today at [facility]  - has h/o s/p clipping of 2 jejunal AVMs in 4/16.  - 4/18 EGD/colon negative 2. Chronic systolic HF: s/p HM-II LVAD 10/2014. Remains NYHA II.  -- LVAD speed reduced to 8800 in 10/17 due to RV failure. - s/p previous driveline repair. No further pump stops 3. Atrial  arrhythmias: Atrial fibrillation, atrial tachycardia. S/p DC-CV 06/04/14, DC-CV 03/02/15 and 01/19/16.  - Continue amio 200 mg daily.  4. HTN:  -Maps improved. Continue  12.5 mg toprol xl daily. Prior to admit on Toprol XL 25 mg twice a day.      5. LVAD:  -VAD interrogated personally. Parameters stable. -LDH stable.  6. CKD, stage III:  - Stable.  - Creatinine 1.2 7. Anticoagulation management:  - Goal INR 2-2.5 - INR 2.19  - He is off ASA with GI bleeding. .. - Hold warfarin and Plavix with acute bleed.  8. 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 S/p R CEA 01/13/16.  - Continue atorvastatin.  - Hold plavix.  9. H/o amio-induced hypethyroidism:  - Followed by Dr. Forde Dandy in past.  - Amio restarted due to recurrent atrial arrhythmias and VT.  - Recent Free T4 elevated. Amio cut back to 200 daily.  -Continue synthroid 200 mcg daily except 300 mcg on Monday. Check TSH  - Wil need f/u with Dr. Monna Fam if unable to see Dr. Forde Dandy 10 .   VT - underwent DC-CV 9/17.  - repeat episode of slow VT 08/17/17. Requiring DC-CV.   - Continue amio 200 mg daily.  - Keep K > 4.0 Mg > 2.0   Transfer to 2C today.   I reviewed the LVAD parameters from today, and compared the results to the patient's prior recorded data.  No programming changes were made.  The LVAD is functioning within specified parameters.  The patient performs LVAD self-test daily.  LVAD interrogation was negative for any significant power changes, alarms or PI events/speed drops.  LVAD equipment check completed and is in good working order.  Back-up equipment present.   LVAD education done on emergency procedures and precautions and reviewed exit site care.  Length of Stay: 3  Amy Clegg, NP 10/17/2017, 7:41 AM  VAD Team --- VAD ISSUES ONLY--- Pager [phone] (7am - 7am)  Advanced Heart Failure Team  Pager [phone] (M-F; 7a - 4p)  Please contact [facility] for night-coverage after hours (4p -7a ) and  weekends on [email or web address]  Patient seen and examined with Darrick Grinder, NP. We discussed all aspects of the encounter. I agree with the assessment and plan as stated above.   Underwent enteroscopy today. Discussed with GI. Found two small SB AVMs which were treated. Not actively bleeding. Hgb now stable. INR 2.1. Rhythm stable. MAPs improved.  VAD interrogated personally. Parameters stable.  Will restart coumadin. Continue INR goal 2.0-2.5. Will hold plavix for now. Coumadin dosing d/w PharmD. Watch Hgb closely.  VAD interrogated personally. Parameters stable. Synthroid dose dropped down due to elevated T4. Will repeat in 4 weeks.   Glori Bickers, MD  2:49 PM

## 2017-10-17 NOTE — Op Note (Signed)
[facility] Patient Name: [name] Procedure Date : 10/17/2017 MRN: [id number] Attending MD: Carlota Raspberry. Forrest Jaroszewski MD, MD Date of Birth: 11/24/1940 CSN: [id number] Age: 76 Admit Type: Inpatient Procedure:                Small bowel enteroscopy Indications:              Melena, history of Plavix and Coumadin use for                            LVAD, INR now 2.2, last dose of Plavix 4 days ago Providers:                Remo Lipps P. Johara Lodwick MD, MD, Carmie End, RN,                            William Dalton, Technician Referring MD:              Medicines:                Monitored Anesthesia Care Complications:            No immediate complications. Estimated blood loss:                            Minimal. Estimated Blood Loss:     Estimated blood loss was minimal. Procedure:                Pre-Anesthesia Assessment:                           - Prior to the procedure, a History and Physical                            was performed, and patient medications and                            allergies were reviewed. The patient's tolerance of                            previous anesthesia was also reviewed. The risks                            and benefits of the procedure and the sedation                            options and risks were discussed with the patient.                            All questions were answered, and informed consent                            was obtained. Prior Anticoagulants: The patient has                            taken Coumadin (warfarin), last dose was 4 days  prior to procedure. ASA Grade Assessment: III - A                            patient with severe systemic disease. After                            reviewing the risks and benefits, the patient was                            deemed in satisfactory condition to undergo the                            procedure.                           After obtaining  informed consent, the endoscope was                            passed under direct vision. Throughout the                            procedure, the patient's blood pressure, pulse, and                            oxygen saturations were monitored continuously. The                            EC-[id number] ([id number]) scope was introduced through                            the mouth and advanced to the proximal jejunum. The                            small bowel enteroscopy was accomplished without                            difficulty. The patient tolerated the procedure                            well. Scope In: Scope Out: Findings:      Esophagogastric landmarks were identified: the Z-line was found at 40       cm, the gastroesophageal junction was found at 40 cm and the upper       extent of the gastric folds was found at 40 cm from the incisors.      The exam of the esophagus was otherwise normal.      The entire examined stomach was normal.      There was no evidence of significant pathology in the entire examined       duodenum.      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 Fulguration to ablate the lesion to prevent bleeding       by argon plasma was successful.      Exam of the proximal jejunum was otherwise normal. Impression:               -  Esophagogastric landmarks identified.                           - Normal esophagus                           - Normal stomach.                           - Normal examined duodenum.                           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 Treated with argon plasma coagulation                            (APC). Recommendation:           - Return patient to hospital ward for ongoing care.                           - Clear liquid diet.                           - Continue present medications.                           - Please keep INR at the  lowest possible acceptable                            range to minimize risk of recurrent bleeding                           - If the patient continues to have bleeding,                            recommend capsule endoscopy to assess the rest of                            the small bowel.                           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 All rights reserved. The codes documented in this report are preliminary and upon coder review may  be revised to meet current compliance requirements. Remo Lipps P. Vitoria Conyer MD, MD 10/17/2017 10:10:55 AM This report has been signed electronically. Number of Addenda: 0

## 2017-10-17 NOTE — Interval H&P Note (Signed)
History and Physical Interval Note:  10/17/2017 8:51 AM  [name]  has presented today for surgery, with the diagnosis of GI bleed with Melana and blood loss anemia in LVAD pt.  last coumadin 12/9 in PM, last Plavix 12/9 in AM.  The various methods of treatment have been discussed with the patient and family. After consideration of risks, benefits and other options for treatment, the patient has consented to  Procedure(s) with comments: ENTEROSCOPY (N/A) - LVAD pt.   as a surgical intervention .  The patient's history has been reviewed, patient examined, no change in status, stable for surgery.  I have reviewed the patient's chart and labs.  Questions were answered to the patient's satisfaction.     [place]

## 2017-10-17 NOTE — Progress Notes (Signed)
ANTICOAGULATION CONSULT NOTE - Follow Up Consult  Pharmacy Consult for Warfarin Indication: LVAD  Allergies  Allergen Reactions  . Demerol [Meperidine] Other (See Comments)    Paralysis. Could only move eyes.     Patient Measurements: Height: 6' (182.9 cm) Weight: 196 lb (88.9 kg) IBW/kg (Calculated) : 77.6  Vital Signs: Temp: 98.2 F (36.8 C) (12/13 1200) Temp Source: Oral (12/13 1200) BP: 100/74 (12/13 1200) Pulse Rate: 86 (12/13 1300)  Labs: Recent Labs    10/16/17 0300 10/16/17 1855 10/17/17 0325  HGB 8.3* 8.5* 8.4*  HCT 25.1* 26.5* 26.3*  PLT 159 196 185  LABPROT 30.0* 26.3* 24.1*  INR 2.89 2.44 2.19  CREATININE 1.21 1.22 1.19    Estimated Creatinine Clearance: 57.1 mL/min (by C-G formula based on SCr of 1.19 mg/dL).  Assessment: 75yom with LVAD HM2 admitted with GIB. INR 3.93 on admit then trended up to 4.06 despite holding warfarin. He received 1 unit FFP on 12/11. INR down to 2.19 today and he had an endoscopy which showed 2 AVMs, both treated with APC. Warfarin to resume tonight without heparin bridge. Per Dr. Haroldine Laws, will not lower INR goal. Hgb stable 8.4 s/p 4 units PRBCs total.   Home dose: 2mg  daily except 4mg  Tues/Thurs/Sun  Goal of Therapy:  INR 2-2.5 Monitor platelets by anticoagulation protocol: Yes   Plan:  1) Warfarin 6mg  tonight 2) Daily INR, CBC  Deboraha Sprang 10/17/2017,1:38 PM

## 2017-10-17 NOTE — Progress Notes (Signed)
VAD Coordinator Procedure Note:   Patient underwent endoscopy per Dr. Armbruster. VAD Coordinator met patient in holding room, nurse returned to floor. Hemodynamics and VAD parameters monitored by myself and anesthesia throughout the procedure. MAPs were obtained with manual BP cuff on right arm and correlated with automatic cuff pressure MAP.           Doppler: Auto cuff(MAP): HR: Sat: Flow: PI: Power:     Speed:      Pre-procedure:  8:30 78 103/73 (81)  60 97 4.0 7.9 4.5       8800 9:15   101/80 (95)  64 98 4.3 6.7 4.6    Sedation Induction: 9:20   75/62 (68)  66 100 3.9 5.3 4.4 9:30   92/70 (78)  72 100 3.6 8.0 4.4  Recovery area: 9:40   72/60 (65)  60 100 3.7 6.2 4.4 9:45   55/36 (40)  65 98 4.0 4.5 4.6 10:00  72 67/49 (53)  64 99 4.0 4.5 4.5 10:15   99/82 (86)  64 99 4.2 7.2 4.7    Patient tolerated the procedure well.   Patient Disposition: 2H08 

## 2017-10-18 ENCOUNTER — Inpatient Hospital Stay (HOSPITAL_COMMUNITY): Payer: Medicare Other

## 2017-10-18 DIAGNOSIS — N201 Calculus of ureter: Secondary | ICD-10-CM

## 2017-10-18 LAB — BASIC METABOLIC PANEL
ANION GAP: 6 (ref 5–15)
BUN: 19 mg/dL (ref 6–20)
CALCIUM: 8 mg/dL — AB (ref 8.9–10.3)
CO2: 22 mmol/L (ref 22–32)
CREATININE: 1.21 mg/dL (ref 0.61–1.24)
Chloride: 110 mmol/L (ref 101–111)
GFR calc Af Amer: >60 mL/min (ref >60)
GFR, EST NON AFRICAN AMERICAN: 56 mL/min — AB (ref >60)
GLUCOSE: 99 mg/dL (ref 65–99)
Potassium: 3.6 mmol/L (ref 3.5–5.1)
Sodium: 138 mmol/L (ref 135–145)

## 2017-10-18 LAB — URINALYSIS, ROUTINE W REFLEX MICROSCOPIC
BILIRUBIN URINE: NEGATIVE
Glucose, UA: NEGATIVE mg/dL
Ketones, ur: NEGATIVE mg/dL
Nitrite: NEGATIVE
PROTEIN: 30 mg/dL — AB
SQUAMOUS EPITHELIAL / LPF: NONE SEEN
Specific Gravity, Urine: 1.017 (ref 1.005–1.030)
pH: 5 (ref 5.0–8.0)

## 2017-10-18 LAB — TSH: TSH: 5.758 u[IU]/mL — AB (ref 0.350–4.500)

## 2017-10-18 LAB — CBC
HCT: 25.4 % — ABNORMAL LOW (ref 39.0–52.0)
HEMOGLOBIN: 8.1 g/dL — AB (ref 13.0–17.0)
MCH: 30.3 pg (ref 26.0–34.0)
MCHC: 31.9 g/dL (ref 30.0–36.0)
MCV: 95.1 fL (ref 78.0–100.0)
Platelets: 178 10*3/uL (ref 150–400)
RBC: 2.67 MIL/uL — ABNORMAL LOW (ref 4.22–5.81)
RDW: 19.3 % — AB (ref 11.5–15.5)
WBC: 5.6 10*3/uL (ref 4.0–10.5)

## 2017-10-18 LAB — LACTATE DEHYDROGENASE: LDH: 214 U/L — ABNORMAL HIGH (ref 98–192)

## 2017-10-18 LAB — PROTIME-INR
INR: 2.17
PROTHROMBIN TIME: 24 s — AB (ref 11.4–15.2)

## 2017-10-18 MED ORDER — OXYCODONE HCL 5 MG PO TABS: 5.0000 mg | ORAL_TABLET | ORAL | Status: DC | PRN

## 2017-10-18 MED ORDER — WARFARIN SODIUM 4 MG PO TABS
4.0000 mg | ORAL_TABLET | Freq: Once | ORAL | Status: AC
  Administered 2017-10-18: 4 mg via ORAL
  Filled 2017-10-18: qty 1

## 2017-10-18 MED ORDER — POTASSIUM CHLORIDE CRYS ER 20 MEQ PO TBCR
40.0000 meq | EXTENDED_RELEASE_TABLET | Freq: Once | ORAL | Status: AC
Start: 2017-10-18 — End: 2017-10-18
  Administered 2017-10-18: 40 meq via ORAL
  Filled 2017-10-18: qty 2

## 2017-10-18 MED ORDER — TAMSULOSIN HCL 0.4 MG PO CAPS
0.4000 mg | ORAL_CAPSULE | Freq: Every day | ORAL | Status: DC
  Administered 2017-10-18 – 2017-10-22 (×5): 0.4 mg via ORAL
  Filled 2017-10-18 (×5): qty 1

## 2017-10-18 MED ORDER — MORPHINE SULFATE (PF) 2 MG/ML IV SOLN: 2.0000 mg | INTRAVENOUS | Status: DC | PRN

## 2017-10-18 MED ORDER — CEPHALEXIN 500 MG PO CAPS
500.0000 mg | ORAL_CAPSULE | Freq: Two times a day (BID) | ORAL | Status: DC
  Administered 2017-10-18: 500 mg via ORAL
  Filled 2017-10-18: qty 1

## 2017-10-18 NOTE — Progress Notes (Signed)
      Progress Note   Subjective  Patient reports passing very dark urine this AM. Urinalysis pending. Cr normal. He denies any abdominal pains. He has not had any further BMs, Hgb relatively stable. Wants to eat.   Objective   Vital signs in last 24 hours: Temp:  [98 F (36.7 C)-98.4 F (36.9 C)] 98 F (36.7 C) (12/14 0811) Pulse Rate:  [59-86] 62 (12/13 2034) Resp:  [11-23] 13 (12/14 0812) BP: (46-128)/(32-88) 93/82 (12/14 0812) SpO2:  [97 %-100 %] 99 % (12/14 0430) Weight:  [87.1 kg (192 lb 0.3 oz)] 87.1 kg (192 lb 0.3 oz) (12/14 0430) Last BM Date: 10/17/17 General:    white male in NAD Heart:  Regular rate and rhythm; LVAD Lungs: Respirations even and unlabored, lungs CTA bilaterally Abdomen:  Soft, nontender and nondistended.  Extremities:  Without edema. Neurologic:  Alert and oriented,  grossly normal neurologically. Psych:  Cooperative. Normal mood and affect.  Intake/Output from previous day: 12/13 0701 - 12/14 0700 In: 1090 [P.O.:840; IV Piggyback:250] Out: 1100 [Urine:1100] Intake/Output this shift: Total I/O In: -  Out: 100 [Urine:100]  Lab Results: Recent Labs    10/16/17 1855 10/17/17 0325 10/18/17 0220  WBC 7.3 6.7 5.6  HGB 8.5* 8.4* 8.1*  HCT 26.5* 26.3* 25.4*  PLT 196 185 178   BMET Recent Labs    10/16/17 1855 10/17/17 0325 10/18/17 0220  NA 138 140 138  K 3.9 4.2 3.6  CL 111 110 110  CO2 21* 24 22  GLUCOSE 113* 106* 99  BUN 31* 25* 19  CREATININE 1.22 1.19 1.21  CALCIUM 8.2* 8.2* 8.0*   LFT No results for input(s): PROT, ALBUMIN, AST, ALT, ALKPHOS, BILITOT, BILIDIR, IBILI in the last 72 hours. PT/INR Recent Labs    10/17/17 0325 10/18/17 0220  LABPROT 24.1* 24.0*  INR 2.19 2.17    Studies/Results: No results found.     Assessment / Plan:   76 y/o male with a history of LVAD in place with history of CAD with DES, on coumadin and Plavix, admitted with upper GI bleeding. 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 He's not had any further black bowel movement (although he may if old blood in his tract), Hgb relatively stable. BUN continues to downtrend which is a good sign and argues against active bleeding.  Recommend the following: -  I think he can eat a regular diet today -  monitor for recurrent bleeding. Hopefully keeping his INR at the lower limit of acceptable range for his LVAD and holding plavix will help minimize his risk for further bleeding. If he has recurrent bleeding moving forward please contact me, would consider capsule endoscopy in this setting.  - recommend a trial of Octreotide if he has not had this already to help minimize risk of future bleeding  Will sign off for now, please call again with any questions or recurrence of bleeding.   Jamaica Cellar, MD [facility] Pager [phone]

## 2017-10-18 NOTE — Progress Notes (Signed)
LVAD Coordinator Rounding Note:  Admitted 10/14/17 dueto GIB. Hgb 6.5 on arrival.  HM II LVAD implated on 10/19/13 by Dr. Cyndia Bent under Destination Therapy criteria.  He is concerned about his dark colored urine. No reports of melena or dark stool overnight.  Oda Kilts notified.  Vital signs: Temp:  98.1 HR:66 Doppler Pressure:81 Automatic BP:93/82 (87) O2 Sat:  100 % RA Wt: 199>197>192>196>196>192 lbs   LVAD interrogation reveals:  Speed: 8800 Flow: 3.7 Power:4.5w PI:7.5 Alarms:none  Events:10 PI overnight  Fixed speed: 8800 Low speed limit: 8200   Drive Line:Weekly dressing kit. Due 12/16. On exam, dressing CDI with anchor present.  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 Call VAD pagerfor any equipment needs.  Balinda Quails RN, VAD Coordinator 24/7 pager [phone]

## 2017-10-18 NOTE — Progress Notes (Addendum)
ANTICOAGULATION CONSULT NOTE - Follow Up Consult  Pharmacy Consult for Warfarin Indication: LVAD  Allergies  Allergen Reactions  . Demerol [Meperidine] Other (See Comments)    Paralysis. Could only move eyes.     Patient Measurements: Height: 6' (182.9 cm) Weight: 192 lb 0.3 oz (87.1 kg) IBW/kg (Calculated) : 77.6  Vital Signs: Temp: 98.4 F (36.9 C) (12/14 0430) Temp Source: Oral (12/14 0430) BP: 60/51 (12/14 0436) Pulse Rate: 62 (12/13 2034)  Labs: Recent Labs    10/16/17 1855 10/17/17 0325 10/18/17 0220  HGB 8.5* 8.4* 8.1*  HCT 26.5* 26.3* 25.4*  PLT 196 185 178  LABPROT 26.3* 24.1* 24.0*  INR 2.44 2.19 2.17  CREATININE 1.22 1.19 1.21    Estimated Creatinine Clearance: 56.1 mL/min (by C-G formula based on SCr of 1.21 mg/dL).  Assessment: 75yom with LVAD HM2 admitted with GIB. INR 3.93 on admit then trended up to 4.06 despite holding warfarin. He received 1 unit FFP on 12/11.   INR stable at 2.1 today. Endoscopy showed 2 AVMs, both treated with APC. Per Dr. Haroldine Laws, will not lower INR goal. Hgb stable but down trending this morning at 8.1 s/p 4 units PRBCs total.   Warfarin off since admit on 12/10 Warfarin resumed 12/13  Home dose: 2mg  daily except 4mg  Tues/Thurs/Sun  Goal of Therapy:  INR 2-2.5 Monitor platelets by anticoagulation protocol: Yes   Plan:  1) Warfarin 4mg  tonight 2) Daily INR, CBC  Erin Hearing PharmD., BCPS Clinical Pharmacist Pager [phone] 10/18/2017 7:22 AM

## 2017-10-18 NOTE — Progress Notes (Signed)
Advanced Heart Failure VAD Team Note  Subjective:    Admitted with symptomatic anemia. Hgb on admit 6.5. Coumadin held. GI consulted.   Received total 4UPRBCs.   10/16/17 had 1 black BM. Hgb 8.5 -> 8.4 -> 8.1 LDH 239 -> 214  Feeling good this morning. Denies SOB or dark stools. Urine cola colored this am. Denies dysuria, frequency, hesitancy, or urgency.   LVAD INTERROGATION:  HeartMate II LVAD:  Flow 4.3 liters/min, speed 8800, power 5.0, PI 6.6  Objective:    Vital Signs:   Temp:  [98 F (36.7 C)-98.4 F (36.9 C)] 98 F (36.7 C) (12/14 0811) Pulse Rate:  [59-86] 62 (12/13 2034) Resp:  [11-23] 13 (12/14 0812) BP: (46-128)/(32-88) 93/82 (12/14 0812) SpO2:  [97 %-100 %] 99 % (12/14 0430) Weight:  [192 lb 0.3 oz (87.1 kg)] 192 lb 0.3 oz (87.1 kg) (12/14 0430) Last BM Date: 10/16/17   Mean arterial Pressure 80-90   Intake/Output:   Intake/Output Summary (Last 24 hours) at 10/18/2017 0827 Last data filed at 10/18/2017 0400 Gross per 24 hour  Intake 1090 ml  Output 950 ml  Net 140 ml    Physical Exam    GENERAL: Well appearing this am. NAD.  HEENT: Normal. NECK: Supple, JVP 7-8 cm. Carotids OK.  CARDIAC:  Mechanical heart sounds with LVAD hum present.  LUNGS:  CTAB, normal effort.  ABDOMEN:  NT, ND, no HSM. No bruits or masses. +BS  LVAD exit site: Well-healed and incorporated. Dressing dry and intact. No erythema or drainage. Stabilization device present and accurately applied. Driveline dressing changed daily per sterile technique. EXTREMITIES:  Warm and dry. No cyanosis, clubbing, rash, or edema.  NEUROLOGIC:  Alert & oriented x 3. Cranial nerves grossly intact. Moves all 4 extremities w/o difficulty. Affect pleasant     Telemetry    A paced 60s with PVCs, personally reviewed.   EKG    N/A  Labs   Basic Metabolic Panel: Recent Labs  Lab 10/15/17 0257 10/16/17 0300 10/16/17 1855 10/17/17 0325 10/18/17 0220  NA 137 138 138 140 138  K 4.3 3.8 3.9  4.2 3.6  CL 111 111 111 110 110  CO2 19* 21* 21* 24 22  GLUCOSE 102* 111* 113* 106* 99  BUN 44* 38* 31* 25* 19  CREATININE 1.17 1.21 1.22 1.19 1.21  CALCIUM 8.1* 8.0* 8.2* 8.2* 8.0*    Liver Function Tests: No results for input(s): AST, ALT, ALKPHOS, BILITOT, PROT, ALBUMIN in the last 168 hours. No results for input(s): LIPASE, AMYLASE in the last 168 hours. No results for input(s): AMMONIA in the last 168 hours.  CBC: Recent Labs  Lab 10/15/17 2118 10/16/17 0300 10/16/17 1855 10/17/17 0325 10/18/17 0220  WBC 7.9 6.9 7.3 6.7 5.6  HGB 8.6* 8.3* 8.5* 8.4* 8.1*  HCT 26.3* 25.1* 26.5* 26.3* 25.4*  MCV 91.6 91.6 94.0 93.9 95.1  PLT 177 159 196 185 178   INR: Recent Labs  Lab 10/15/17 1447 10/16/17 0300 10/16/17 1855 10/17/17 0325 10/18/17 0220  INR 2.86 2.89 2.44 2.19 2.17   Other results:    Imaging   No results found.  Medications:    Scheduled Medications: . amiodarone  200 mg Oral Daily  . atorvastatin  80 mg Oral q1800  . levothyroxine  150 mcg Oral QAC breakfast  . metoprolol succinate  12.5 mg Oral Daily  . pantoprazole  40 mg Oral BID  . ranolazine  500 mg Oral BID  . warfarin  4 mg Oral  ONCE-1800  . 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 Syncope due to acute GI bleed and symptomatic anemia Received 4UPRBCs.and 1FFP  - Hgb today 8.1. Denies further bleeding.   - INR 2.17 - coumadin resumed 10/17/17.  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 Treated with APC.  - Has h/o s/p clipping of 2 jejunal AVMs in 4/16.  - 4/18 EGD/colon  negative 2. Chronic systolic HF: s/p HM-II LVAD 10/2014. Remains NYHA II.  -LVAD speed reduced to 8800 in 10/17 due to RV failure. - S/p previous driveline repair. No further pump stops 3. Atrial arrhythmias: Atrial fibrillation, atrial tachycardia. S/p DC-CV 06/04/14, DC-CV 03/02/15 and 01/19/16.  - Continue amio 200 mg daily.  4. HTN:  - MAPs stable.  - Continue toprol xl 12.5 mg daily.  5. LVAD:  -VAD interrogated personally. Parameters stable.  - LDH stable. 6. CKD, stage III:  - Creatinine stable at 1.2. 7. Anticoagulation management:  - Goal INR 2-2.5 - INR 2.17.  - Off ASA with GI bleeding. - Coumadin restarted 10/17/17.  8. 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 S/p R CEA 01/13/16.  - Continue atorvastatin.  - Hold plavix.  9. H/o amio-induced hypethyroidism:  - Followed by Dr. Forde Dandy in past.  - Amio restarted due to recurrent atrial arrhythmias and VT.  - Recent Free T4 elevated. Continue amiodarone 200 mg daily.  - Continue synthroid 200 mcg daily except 300 mcg on Monday. TSH 5.758  - Wil need f/u with Dr. Monna Fam if unable to see Dr. Forde Dandy 10. VT - Underwent DC-CV 9/17.  - Repeat episode of slow VT 08/17/17. Requiring DC-CV.  - Continue amio 200 mg daily.  - Keep K > 4.0 Mg > 2.0 11. Hematuria - Noted this am. States last time he had dark urine he had a 13 mm Kidney stone - UA now. - Will order CT renal study.   I reviewed the LVAD parameters from today, and compared the results to the patient's prior recorded data.  No programming changes were made.  The LVAD is functioning within specified parameters.  The patient performs LVAD self-test daily.  LVAD interrogation was negative for any significant power changes, alarms or PI events/speed drops.  LVAD equipment check completed and is in good working order.  Back-up equipment present.   LVAD education done on emergency procedures and precautions and reviewed exit site care.   Length of Stay:  4  Annamaria Helling 10/18/2017, 8:27 AM  VAD Team --- VAD ISSUES ONLY--- Pager [phone] (7am - 7am)  Advanced Heart Failure Team  Pager [phone] (M-F; 7a - 4p)  Please contact [facility] for night-coverage after hours (4p -7a ) and weekends on [email or web address]  Patient seen and examined with the above-signed Advanced Practice Provider and/or Housestaff. I personally reviewed laboratory data, imaging studies and relevant notes. I independently examined the patient and formulated the important aspects of the plan. I have edited the note to reflect any of my changes or salient points. I have personally discussed the plan with the  patient and/or family.  Hgb stable. No further evidence of GI Bleeding. INR looks good at 2.17. Dosing discussed with PharmD. We have stopped Plavix. Continue INR goal 2.0-2.5.   CT scan of abdomen reviewed personally. Had proximal R ureteral stone. Likely small enough to pass. Have discussed with Urology who will see him tonight. Flomax trial.   HF currently stable. VAD interrogated personally. Parameters stable.   Likely keep in house until Monday until all acute issues resolved.   Glori Bickers, MD  4:21 PM

## 2017-10-18 NOTE — Progress Notes (Signed)
  CT renal with 6 mm stone in left proximal ureter with no significant hydronephrosis.   Discussed with Dr. Gloriann Loan of [facility] who will see him later today.   He recommended "trial of passage" and starting patient on 0.4 mg of flowmax until he has passed the stone.     Legrand Como [address] [place], PA-C 10/18/2017 12:15 PM

## 2017-10-18 NOTE — Consult Note (Addendum)
Urology Consult Note   Requesting Attending Physician:  Jolaine Artist, MD Service Providing Consult: Urology  Consulting Attending: Gloriann Loan   Reason for Consult:  Ureteral Stone  HPI: [name] is seen in consultation for reasons noted above at the request of Bensimhon, Shaune Pascal, MD for evaluation of Renal stone.  This is a 76 y.o. 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 Recently had 2 AVM ablated.   This morning had cola colored urine. No flank pain, nausea or vomiting. Otherwise was doing well. AF, VSS.   CT scan revealed  32m left proximal ureteral stone, with minimal hydronephrosis.   WBC - 5.6 U/A - small LE, negative nitrite, many bacteria, WBC TNTC   Hx of nephrolithiasis. Had a 113mstone treated by Dr. GrElmore Guise few years ago.    Past Medical History: Past Medical History:  Diagnosis Date  . Arthritis   . Automatic implantable cardioverter-defibrillator in situ    a. s/p Medtronic Evera device serial #[id number]    . Bradycardia   . CAD (coronary artery disease)    a. s/p MI 1982;  s/p DES to CFX 2011;  s/p NSTEMI 4/12 in setting of VTach;  cath 7/12:  LM ok, LAD mid 30-40%, Dx's with 40%, mCFX stent patent, prox to mid RCA occluded with R to R and L to R collats.  Medical Tx was continued  . Chronic systolic heart failure ([facility]   a. s/p LVAD 10/2013 for destination therapy  . CKD (chronic kidney disease)   . CVD (cerebrovascular disease)    a. s/p L CEA 2008  . Cytopenia   . Dysrhythmia    AFIB  . History of GI bleed    a. on ASA- started on plavix during 10/2015 admission for CVA  . HLD (hyperlipidemia)   . HTN (hypertension)   . Hypothyroidism   . Inappropriate therapy from implantable cardioverter-defibrillator    a. ATP for sinus tach SVT wavelet identified SVT but was no  passive (2014)  . Ischemic cardiomyopathy   . Melanoma of ear ([facility]   a. L Ear   . Myocardial infarction (HC[facility]   1992  . PAF  (paroxysmal atrial fibrillation) ([facility]  . PVD (peripheral vascular disease) ([facility]   a. 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 Shortness of breath dyspnea    W/ EXERTION   . Sleep apnea    NO CPAP NOW  HE SENT IT BACK YRS AGO  . [facility]   a. 10/2015 admission   . Ventricular tachycardia ([facility]   a. s/p AICD;   h/o ICD shock;  Amiodarone Rx. S/P ablation in 2012    Past Surgical History:  Past Surgical History:  Procedure Laterality Date  . BACK SURGERY    . CARDIAC CATHETERIZATION     "several" (05/25/2013)  . CARDIAC DEFIBRILLATOR PLACEMENT  2003; 2005; 05/25/2013   2014: Medtronic Evera device serial number [id number]  . CARDIAC ELECTROPHYSIOLOGY STUDY AND ABLATION  2012  . CARDIOVERSION N/A 10/15/2013   Procedure: CARDIOVERSION;  Surgeon: DaJolaine ArtistMD;  [place] Service: Cardiovascular;  Laterality: N/A;  . CARDIOVERSION N/A 11/04/2013   Procedure: CARDIOVERSION;  Surgeon: DaLarey DresserMD;  [facility] Service: Cardiovascular;  Laterality: N/A;  . CARDIOVERSION N/A 06/04/2014   Procedure: CARDIOVERSION;  Surgeon: Jolaine Artist, MD;  Location: [facility] ENDOSCOPY;  Service: Cardiovascular;  Laterality: N/A;  . CARDIOVERSION N/A 03/02/2015   Procedure: CARDIOVERSION;  Surgeon: Jolaine Artist, MD;  Location: [facility] ENDOSCOPY;  Service: Cardiovascular;  Laterality: N/A;  . CARDIOVERSION N/A 01/19/2016   Procedure: CARDIOVERSION;  Surgeon: Larey Dresser, MD;  Location: [place];  Service: Cardiovascular;  Laterality: N/A;  . CAROTID ENDARTERECTOMY Left ~ 2007  . CATARACT EXTRACTION W/ INTRAOCULAR LENS  IMPLANT, BILATERAL Bilateral 2000  . CHOLECYSTECTOMY  12/15/?2010  . COLONOSCOPY WITH PROPOFOL N/A 10/21/202018   Procedure: COLONOSCOPY WITH PROPOFOL;  Surgeon: Ronald Lobo, MD;  Location: [place];  Service: Endoscopy;  Laterality: N/A;  .  CORONARY ANGIOPLASTY  1992  . CORONARY ANGIOPLASTY WITH STENT PLACEMENT     "last one was 11/2012  (05/25/2013)  . CYSTOSCOPY WITH RETROGRADE PYELOGRAM, URETEROSCOPY AND STENT PLACEMENT Left 08/09/2014   Procedure: CYSTOSCOPY WITH RETROGRADE PYELOGRAM, URETEROSCOPY AND STENT PLACEMENT;  Surgeon: Bernestine Amass, MD;  [facility];  Service: Urology;  Laterality: Left;  . CYSTOSCOPY WITH RETROGRADE PYELOGRAM, URETEROSCOPY AND STENT PLACEMENT Left 09/01/2014   Procedure: CYSTOSCOPY WITH URETEROSCOPY AND STENT REMOVAL;  Surgeon: Bernestine Amass, MD;  [facility];  Service: Urology;  Laterality: Left;  . CYSTOSCOPY WITH RETROGRADE PYELOGRAM, URETEROSCOPY AND STENT PLACEMENT Left 09/20/2014   Procedure: CYSTOSCOPY WITH RETROGRADE PYELOGRAM, URETEROSCOPY AND STENT PLACEMENT, stone removal;  Surgeon: Bernestine Amass, MD;  [facility];  Service: Urology;  Laterality: Left;  . DOPPLER ECHOCARDIOGRAPHY  2011  . ELBOW ARTHROSCOPY Right 1990's  . ELECTROPHYSIOLOGIC STUDY N/A 07/19/2016   Procedure: Cardioversion;  Surgeon: Evans Lance, MD;  Location: [place] CV LAB;  Service: Cardiovascular;  Laterality: N/A;  . ENDARTERECTOMY Right 01/13/2016   Procedure: RIGHT CAROTID ARTERY ENDARTERECTOMY;  Surgeon: Mal Misty, MD;  Location: [place];  Service: Vascular;  Laterality: Right;  . ENTEROSCOPY Left 02/23/2017   Procedure: ENTEROSCOPY;  Surgeon: Ronald Lobo, MD;  Location: [facility] ENDOSCOPY;  Service: Endoscopy;  Laterality: Left;  . ESOPHAGOGASTRODUODENOSCOPY N/A 02/15/2015   Procedure: ESOPHAGOGASTRODUODENOSCOPY (EGD);  Surgeon: Carol Ada, MD;  Location[facility] ENDOSCOPY;  Service: Endoscopy;  Laterality: N/A;  LVAD  . EYE SURGERY     VITRECTOMY  LEFT 05/2012  . FEMORAL-POPLITEAL BYPASS GRAFT Right 2011  . FEMORAL-POPLITEAL BYPASS GRAFT Left 05/2002   Archie Endo 05/06/2002 (05/25/2013)  . HEEL SPUR SURGERY Right 1990's  . HOLMIUM LASER APPLICATION Left 52/84/1324   Procedure: HOLMIUM LASER APPLICATION;   Surgeon: Bernestine Amass, MD;  [facility];  Service: Urology;  Laterality: Left;  . IMPLANTABLE CARDIOVERTER DEFIBRILLATOR GENERATOR CHANGE N/A 05/25/2013   Procedure: IMPLANTABLE CARDIOVERTER DEFIBRILLATOR GENERATOR CHANGE;  Surgeon: Deboraha Sprang, MD;  Location[facility] CATH LAB;  Service: Cardiovascular;  Laterality: N/A;  . INSERTION OF IMPLANTABLE LEFT VENTRICULAR ASSIST DEVICE N/A 10/19/2013   Procedure: INSERTION OF IMPLANTABLE LEFT VENTRICULAR ASSIST DEVICE;  Surgeon: Gaye Pollack, MD;  Location: [place];  Service: Open Heart Surgery;  Laterality: N/A;  CIRC ARREST  NITRIC OXIDE  MEDTRONIC ICD  . INTRAOPERATIVE TRANSESOPHAGEAL ECHOCARDIOGRAM N/A 10/19/2013   Procedure: INTRAOPERATIVE TRANSESOPHAGEAL ECHOCARDIOGRAM;  Surgeon: Gaye Pollack, MD;  Location: [facility] OR;  Service: Open Heart Surgery;  Laterality: N/A;  . KNEE ARTHROSCOPY Bilateral 1990's  . LEAD REVISION N/A 06/05/2013   Procedure: LEAD REVISION;  Surgeon: Thompson Grayer, MD;  Location: [facility] CATH LAB;  Service: Cardiovascular;  Laterality: N/A;  . LEFT HEART CATHETERIZATION WITH CORONARY ANGIOGRAM N/A 11/24/2012   Procedure: LEFT HEART CATHETERIZATION WITH CORONARY ANGIOGRAM;  Surgeon: Peter M Martinique, MD;  Location: [facility] CATH LAB;  Service: Cardiovascular;  Laterality: N/A;  . LIPOMA EXCISION Left 2013   "near ear" (05/25/2013)  . [place]; 2000's  . PATCH ANGIOPLASTY Right 01/13/2016   Procedure: WITH  PATCH ANGIOPLASTY;  Surgeon: Mal Misty, MD;  Location: [place];  Service: Vascular;  Laterality: Right;  . RENAL ARTERY BYPASS Bilateral 2003  . REVASCULARIZATION / IN-SITU GRAFT LEG    . RIGHT HEART CATHETERIZATION N/A 09/17/2013   Procedure: RIGHT HEART CATH;  Surgeon: Jolaine Artist, MD;  Location: [facility] CATH LAB;  Service: Cardiovascular;  Laterality: N/A;  . RIGHT HEART CATHETERIZATION N/A 10/14/2013   Procedure: RIGHT HEART CATH;  Surgeon: Jolaine Artist, MD;  Location: [facility] CATH LAB;  Service:  Cardiovascular;  Laterality: N/A;  . RIGHT HEART CATHETERIZATION N/A 11/16/2013   Procedure: RIGHT HEART CATH;  Surgeon: Jolaine Artist, MD;  Location: [facility] CATH LAB;  Service: Cardiovascular;  Laterality: N/A;  . RIGHT HEART CATHETERIZATION N/A 07/13/2014   Procedure: RIGHT HEART CATH;  Surgeon: Jolaine Artist, MD;  Location: [facility] CATH LAB;  Service: Cardiovascular;  Laterality: N/A;  . TEE WITHOUT CARDIOVERSION N/A 11/04/2013   Procedure: TRANSESOPHAGEAL ECHOCARDIOGRAM (TEE);  Surgeon: Larey Dresser, MD;  Location: [place];  Service: Cardiovascular;  Laterality: N/A;  . TEE WITHOUT CARDIOVERSION N/A 03/02/2015   Procedure: TRANSESOPHAGEAL ECHOCARDIOGRAM (TEE);  Surgeon: Jolaine Artist, MD;  Location: [facility] ENDOSCOPY;  Service: Cardiovascular;  Laterality: N/A;  . TEE WITHOUT CARDIOVERSION N/A 01/19/2016   Procedure: TRANSESOPHAGEAL ECHOCARDIOGRAM (TEE);  Surgeon: Larey Dresser, MD;  Location: [place];  Service: Cardiovascular;  Laterality: N/A;  . TONSILLECTOMY  1946  . TUMOR EXCISION Left 1960's   "fatty tumor" (05/25/2013)  . V-TACH ABLATION N/A 09/12/2011   Procedure: V-TACH ABLATION;  Surgeon: Evans Lance, MD;  Location: [facility] CATH LAB;  Service: Cardiovascular;  Laterality: N/A;  . V-TACH ABLATION N/A 06/08/2013   Procedure: V-TACH ABLATION;  Surgeon: Evans Lance, MD;  Location: [facility] CATH LAB;  Service: Cardiovascular;  Laterality: N/A;    Medication: Current Facility-Administered Medications  Medication Dose Route Frequency Provider Last Rate Last Dose  . acetaminophen (TYLENOL) tablet 650 mg  650 mg Oral Q4H PRN Bensimhon, Shaune Pascal, MD      . amiodarone (PACERONE) tablet 200 mg  200 mg Oral Daily Bensimhon, Shaune Pascal, MD   200 mg at 10/18/17 1018  . atorvastatin (LIPITOR) tablet 80 mg  80 mg Oral q1800 Bensimhon, Shaune Pascal, MD   80 mg at 10/17/17 1750  . levothyroxine (SYNTHROID, LEVOTHROID) tablet 150 mcg  150 mcg Oral QAC breakfast Bensimhon, Shaune Pascal, MD   150 mcg at  10/18/17 7408  . metoprolol succinate (TOPROL-XL) 24 hr tablet 12.5 mg  12.5 mg Oral Daily Clegg, Amy D, NP   12.5 mg at 10/18/17 1018  . morphine 2 MG/ML injection 2 mg  2 mg Intravenous Q3H PRN Marton Redwood III, MD      . ondansetron (ZOFRAN) injection 4 mg  4 mg Intravenous Q6H PRN Bensimhon, Shaune Pascal, MD      . oxyCODONE (Oxy IR/ROXICODONE) immediate release tablet 5 mg  5 mg Oral Q3H PRN Marton Redwood III, MD      . pantoprazole (PROTONIX) EC tablet 40 mg  40 mg Oral BID  Vena Rua, PA-C   40 mg at 10/18/17 1018  . ranolazine (RANEXA) 12 hr tablet 500 mg  500 mg Oral BID Bensimhon, Shaune Pascal, MD   500 mg at 10/18/17 1018  . tamsulosin (FLOMAX) capsule 0.4 mg  0.4 mg Oral Daily Shirley Friar, PA-C   0.4 mg at 10/18/17 1150  . warfarin (COUMADIN) tablet 4 mg  4 mg Oral ONCE-1800 Lyndee Leo, [facility]      . Warfarin - Pharmacist Dosing Inpatient   Does not apply q1800 Otilio Miu, [facility]        Allergies: Allergies  Allergen Reactions  . Demerol [Meperidine] Other (See Comments)    Paralysis. Could only move eyes.     Social History: Social History   Tobacco Use  . Smoking status: Former Smoker    Packs/day: 0.50    Years: 37.00    Pack years: 18.50    Types: Cigarettes    Last attempt to quit: 11/05/2001    Years since quitting: 15.9  . Smokeless tobacco: Never Used  Substance Use Topics  . Alcohol use: Yes    Alcohol/week: 0.0 oz    Comment: 05/25/2013 "mixed drink couple times/month"  . Drug use: No    Family History Family History  Problem Relation Age of Onset  . Heart attack Mother   . Coronary artery disease Mother   . Cancer Mother   . Heart disease Mother   . Hyperlipidemia Mother   . Hypertension Mother   . Coronary artery disease Father   . Stroke Father   . Cancer Father   . Heart disease Father        before age 31  . Hyperlipidemia Father   . Hypertension Father   . Heart attack Father   . Coronary artery disease Unknown      Review of Systems 10 systems were reviewed and are negative except as noted specifically in the HPI.  Objective   Vital signs in last 24 hours: BP 106/86 (BP Location: Left Arm)   Pulse 62   Temp 98.3 F (36.8 C) (Oral)   Resp 14   Ht 6' (1.829 m)   Wt 87.1 kg (192 lb 0.3 oz)   SpO2 99%   BMI 26.04 kg/m   Physical Exam General: NAD, A&O, resting, appropriate HEENT: [place]/AT, EOMI, MMM Pulmonary: Normal work of breathing Abdomen: Soft, NTTP, nondistended,. GU: no  CVA tenderness Extremities: warm and well perfused Neuro: Appropriate, no focal neurological deficits  Most Recent Labs: Lab Results  Component Value Date   WBC 5.6 10/18/2017   HGB 8.1 (L) 10/18/2017   HCT 25.4 (L) 10/18/2017   PLT 178 10/18/2017    Lab Results  Component Value Date   NA 138 10/18/2017   K 3.6 10/18/2017   CL 110 10/18/2017   CO2 22 10/18/2017   BUN 19 10/18/2017   CREATININE 1.21 10/18/2017   CALCIUM 8.0 (L) 10/18/2017   MG 2.2 08/18/2017   PHOS 2.7 10/26/2013    Lab Results  Component Value Date   INR 2.17 10/18/2017   APTT 44 (H) 01/11/2016     IMAGING: Ct Renal Stone Study  Result Date: 10/18/2017 CLINICAL DATA:  Bilateral flank pain.  Hematuria. EXAM: CT ABDOMEN AND PELVIS WITHOUT CONTRAST TECHNIQUE: Multidetector CT imaging of the abdomen and pelvis was performed following the standard protocol without IV contrast. COMPARISON:  09/23/2015 FINDINGS: Lower chest: There is a left ventricular assist device. ICD lead extending into the  right ventricle. Centrilobular emphysema. No large pleural effusions. 4 mm nodule at the left lung base on sequence 7, image 19 and minimally changed from a chest CT on 07/29/2014. Hepatobiliary: Gallbladder has been removed. No acute abnormality to the liver. Pancreas: Normal appearance of the pancreas without inflammation or duct dilatation. Spleen: Normal appearance of spleen without enlargement. Adrenals/Urinary Tract: Normal adrenal glands.  Again noted is a probable left renal cyst with calcifications in left kidney interpolar region. Again noted is a large cyst in the right kidney measuring up to 6.1 cm and minimally changed. 6 mm stone in the proximal left ureter without significant hydronephrosis. There is at least 1 small stone in the left kidney lower pole infundibulum collecting system. No evidence for right kidney or right ureter stones but there is mild dilatation of the distal right ureter, best seen on sequence 3, image 78. There is high-density material along the right posterior bladder near the right ureterovesical junction. Urinary bladder is not distended. No inflammatory changes around the bladder. Stomach/Bowel: Diverticulosis in the sigmoid colon without acute inflammatory changes. No evidence for bowel obstruction or dilatation. Small amount of high-density contrast in the stomach and duodenum. Vascular/Lymphatic: Previous aortic surgery and suggestive for an aortobifemoral bypass. Vascular structures are difficult to evaluate on this noncontrast examination. 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 This aneurysm has minimally changed in size since 09/23/2015. Patient may have bilateral renal artery grafts. No significant lymph node enlargement in the abdomen or pelvis. Reproductive: Prostate is unremarkable. Other: No free fluid.  Negative for free air. Musculoskeletal: Again noted is a fracture involving the L4 vertebral body. Patient now has pedicle screw and rod fixation at L3 and L5. IMPRESSION: 6 mm stone in the proximal left ureter without significant left hydronephrosis. There is at least 1 additional small left renal stone. High-density material along the right side of bladder near the right UVJ and there is mild dilatation of the distal right ureter. The findings raise concern for clot or a bladder lesion at this location.  Recommend urology consultation. 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 Minimal change since 2016. Postsurgical changes in the aorta compatible with an aortobifemoral bypass and there is evidence for visceral artery grafts. Left ventricular assist device. Probable stable 4 mm nodule in the left lower lobe. Stable appearance of the renal cysts. Largest left renal cyst has dystrophic calcifications. These results will be called to the ordering clinician or representative by the Radiologist Assistant, and communication documented in the PACS or zVision Dashboard. Electronically Signed   By: Markus Daft M.D.   On: 10/18/2017 10:49    ------  Assessment:  76 y.o. male with LVAD and recent GI bleed 2/2 AVM with new hematuria. CT revealed 48m proximal ureteral stone. No hydronephrosis. AF, VSS. WBC 5.6. U/A dirty  No indication for stent placement or surgical intervention at this time. Although the urine is concerning for infection. Absence of hydronephrosis signals that stone is likely no completely obstructing.  Also, clinically the patient has no systemic signs of infection and has no pain.   Reasonable for short trial of passage with flomax and outpatient follow up to discuss options, likely USE   Recommendations: - Please culture urine (I ordered) - Flomax 0.417mQnightly  - start keflex x 7 days given urine with possible bactiuria--I asked nursing to contact team to ensure this medication  is fine with them. - strain all urine (order placed for this)--he should be discharged with a strainer - Outpatient follow up with urology to discuss surgical options if MET fails  - Contact urology if the patient develops sign of a systemic infection or has intractable pain or nausea/ vomiting  - obtain KUB (I ordered this)   Thank you for this consult. Please contact the urology consult pager with any further questions/concerns.

## 2017-10-19 LAB — CBC
HCT: 25.4 % — ABNORMAL LOW (ref 39.0–52.0)
HEMOGLOBIN: 8 g/dL — AB (ref 13.0–17.0)
MCH: 29.7 pg (ref 26.0–34.0)
MCHC: 31.5 g/dL (ref 30.0–36.0)
MCV: 94.4 fL (ref 78.0–100.0)
Platelets: 166 10*3/uL (ref 150–400)
RBC: 2.69 MIL/uL — AB (ref 4.22–5.81)
RDW: 18.8 % — ABNORMAL HIGH (ref 11.5–15.5)
WBC: 5.5 10*3/uL (ref 4.0–10.5)

## 2017-10-19 LAB — BASIC METABOLIC PANEL
ANION GAP: 4 — AB (ref 5–15)
BUN: 17 mg/dL (ref 6–20)
CALCIUM: 8 mg/dL — AB (ref 8.9–10.3)
CHLORIDE: 108 mmol/L (ref 101–111)
CO2: 23 mmol/L (ref 22–32)
Creatinine, Ser: 1.32 mg/dL — ABNORMAL HIGH (ref 0.61–1.24)
GFR calc non Af Amer: 50 mL/min — ABNORMAL LOW (ref >60)
GFR, EST AFRICAN AMERICAN: 58 mL/min — AB (ref >60)
Glucose, Bld: 112 mg/dL — ABNORMAL HIGH (ref 65–99)
POTASSIUM: 4.3 mmol/L (ref 3.5–5.1)
Sodium: 135 mmol/L (ref 135–145)

## 2017-10-19 LAB — PROTIME-INR
INR: 2.89
PROTHROMBIN TIME: 30 s — AB (ref 11.4–15.2)

## 2017-10-19 LAB — LACTATE DEHYDROGENASE: LDH: 249 U/L — ABNORMAL HIGH (ref 98–192)

## 2017-10-19 MED ORDER — CEPHALEXIN 500 MG PO CAPS
500.0000 mg | ORAL_CAPSULE | Freq: Three times a day (TID) | ORAL | Status: DC
  Administered 2017-10-19 – 2017-10-22 (×11): 500 mg via ORAL
  Filled 2017-10-19 (×11): qty 1

## 2017-10-19 MED ORDER — WARFARIN SODIUM 1 MG PO TABS
1.0000 mg | ORAL_TABLET | Freq: Once | ORAL | Status: AC
  Administered 2017-10-19: 1 mg via ORAL
  Filled 2017-10-19: qty 1

## 2017-10-19 NOTE — Progress Notes (Signed)
ANTICOAGULATION CONSULT NOTE - Follow Up Consult  Pharmacy Consult for Warfarin Indication: LVAD  Allergies  Allergen Reactions  . Demerol [Meperidine] Other (See Comments)    Paralysis. Could only move eyes.     Patient Measurements: Height: 6' (182.9 cm) Weight: 193 lb 2 oz (87.6 kg) IBW/kg (Calculated) : 77.6  Vital Signs: Temp: 98.6 F (37 C) (12/15 0712) Temp Source: Oral (12/15 0712) BP: 93/77 (12/15 0712) Pulse Rate: 59 (12/15 0712)  Labs: Recent Labs    10/17/17 0325 10/18/17 0220 10/19/17 0332  HGB 8.4* 8.1* 8.0*  HCT 26.3* 25.4* 25.4*  PLT 185 178 166  LABPROT 24.1* 24.0* 30.0*  INR 2.19 2.17 2.89  CREATININE 1.19 1.21 1.32*    Estimated Creatinine Clearance: 50.6 mL/min (A) (by C-G formula based on SCr of 1.32 mg/dL (H)).  Assessment: 75yom with LVAD HM2 admitted with GIB. INR 3.93 on admit then trended up to 4.06 despite holding warfarin. He received 1 unit FFP on 12/11.   INR bump 2.1> 2.89 after restart warfarin. - Will drop dose today and let INR drift down. Endoscopy showed 2 AVMs, both treated with APC. Per Dr. Haroldine Laws, will not lower INR goal given Hx CVA. Hgb low stable 8,  s/p 4 units PRBCs total.   Warfarin off since admit on 12/10 Warfarin resumed 12/13  Home dose: 2mg  daily except 4mg  Tues/Thurs/Sun  Goal of Therapy:  INR 2-2.5 Monitor platelets by anticoagulation protocol: Yes   Plan:  1) Warfarin 1mg  tonight 2) Daily INR, CBC  Bonnita Nasuti Pharm.D. CPP, BCPS Clinical Pharmacist [phone] 10/19/2017 7:47 AM

## 2017-10-19 NOTE — Progress Notes (Signed)
Patient ID: [name], male   DOB: [birth year], 77 y.o.   MRN: [id number]   Advanced Heart Failure VAD Team Note  Subjective:    Admitted with symptomatic anemia. Hgb on admit 6.5. Coumadin held. GI consulted.   Received total 4U PRBCs.   No BM over the last day.  Hemoglobin fairly stable, 8.1 => 8.0  LDH 239 -> 214 -> 2.49  INR 2.89  CT abdomen/pelvis 12/14 with small left ureteral stone.  Seen by urology: started on Flomax.  Plan to observe to see if stone passes with time.  He was started on Keflex for treatment of possible UTI.  No fever, WBCs not elevated.   Feeling good this morning. Denies SOB or dark stools.   LVAD INTERROGATION:  HeartMate II LVAD:  Flow 3.8 liters/min, speed 8800, power 4.3, PI 7.1  Objective:    Vital Signs:   Temp:  [98.2 F (36.8 C)-99.4 F (37.4 C)] 98.6 F (37 C) (12/15 0712) Pulse Rate:  [59-65] 59 (12/15 0712) Resp:  [13-21] 13 (12/15 0712) BP: (65-100)/(55-79) 97/79 (12/15 1143) SpO2:  [93 %-96 %] 93 % (12/15 0712) Weight:  [193 lb 2 oz (87.6 kg)] 193 lb 2 oz (87.6 kg) (12/15 0650) Last BM Date: 10/17/17   Mean arterial Pressure 80s  Intake/Output:   Intake/Output Summary (Last 24 hours) at 10/19/2017 1203 Last data filed at 10/19/2017 0000 Gross per 24 hour  Intake 60 ml  Output 350 ml  Net -290 ml    Physical Exam    GENERAL: Well appearing this am. NAD.  HEENT: Normal. NECK: Supple, JVP 7-8 cm. Carotids OK.  CARDIAC:  Mechanical heart sounds with LVAD hum present.  LUNGS:  CTAB, normal effort.  ABDOMEN:  NT, ND, no HSM. No bruits or masses. +BS  LVAD exit site: Well-healed and incorporated. Dressing dry and intact. No erythema or drainage. Stabilization device present and accurately applied. Driveline dressing changed daily per sterile technique. EXTREMITIES:  Warm and dry. No cyanosis, clubbing, rash, or edema.  NEUROLOGIC:  Alert & oriented x 3. Cranial nerves grossly intact. Moves all 4 extremities w/o difficulty.  Affect pleasant    Telemetry    A paced 60s-70s with PVCs, personally reviewed.   EKG    N/A  Labs   Basic Metabolic Panel: Recent Labs  Lab 10/16/17 0300 10/16/17 1855 10/17/17 0325 10/18/17 0220 10/19/17 0332  NA 138 138 140 138 135  K 3.8 3.9 4.2 3.6 4.3  CL 111 111 110 110 108  CO2 21* 21* 24 22 23   GLUCOSE 111* 113* 106* 99 112*  BUN 38* 31* 25* 19 17  CREATININE 1.21 1.22 1.19 1.21 1.32*  CALCIUM 8.0* 8.2* 8.2* 8.0* 8.0*    Liver Function Tests: No results for input(s): AST, ALT, ALKPHOS, BILITOT, PROT, ALBUMIN in the last 168 hours. No results for input(s): LIPASE, AMYLASE in the last 168 hours. No results for input(s): AMMONIA in the last 168 hours.  CBC: Recent Labs  Lab 10/16/17 0300 10/16/17 1855 10/17/17 0325 10/18/17 0220 10/19/17 0332  WBC 6.9 7.3 6.7 5.6 5.5  HGB 8.3* 8.5* 8.4* 8.1* 8.0*  HCT 25.1* 26.5* 26.3* 25.4* 25.4*  MCV 91.6 94.0 93.9 95.1 94.4  PLT 159 196 185 178 166   INR: Recent Labs  Lab 10/16/17 0300 10/16/17 1855 10/17/17 0325 10/18/17 0220 10/19/17 0332  INR 2.89 2.44 2.19 2.17 2.89   Other results:    Imaging   Dg Abd Portable 1v  Result Date:  10/18/2017 CLINICAL DATA:  Follow-up examination for ureteral calculus. EXAM: PORTABLE ABDOMEN - 1 VIEW COMPARISON:  Priors CT from earlier the same day. FINDINGS: 6 mm calcific densities seen at the left lateral aspect of the L4 vertebral body consistent with previously identified left ureteral calculus, overall little interval changed in position as compared to previous. Probable additional small left renal stone noted as well, also similar. Bowel gas pattern within normal limits. Patient status post fusion at L3 through L5 with chronic L4 compression fracture. LVAD device partially visualized. IMPRESSION: 1. No significant interval change in position of 6 mm left ureteral calculus. 2. Additional punctate left renal stone, also unchanged. 3. Nonobstructive bowel gas pattern.  Electronically Signed   By: Jeannine Boga M.D.   On: 10/18/2017 21:33   Ct Renal Stone Study  Result Date: 10/18/2017 CLINICAL DATA:  Bilateral flank pain.  Hematuria. EXAM: CT ABDOMEN AND PELVIS WITHOUT CONTRAST TECHNIQUE: Multidetector CT imaging of the abdomen and pelvis was performed following the standard protocol without IV contrast. COMPARISON:  09/23/2015 FINDINGS: Lower chest: There is a left ventricular assist device. ICD lead extending into the right ventricle. Centrilobular emphysema. No large pleural effusions. 4 mm nodule at the left lung base on sequence 7, image 19 and minimally changed from a chest CT on 07/29/2014. Hepatobiliary: Gallbladder has been removed. No acute abnormality to the liver. Pancreas: Normal appearance of the pancreas without inflammation or duct dilatation. Spleen: Normal appearance of spleen without enlargement. Adrenals/Urinary Tract: Normal adrenal glands. Again noted is a probable left renal cyst with calcifications in left kidney interpolar region. Again noted is a large cyst in the right kidney measuring up to 6.1 cm and minimally changed. 6 mm stone in the proximal left ureter without significant hydronephrosis. There is at least 1 small stone in the left kidney lower pole infundibulum collecting system. No evidence for right kidney or right ureter stones but there is mild dilatation of the distal right ureter, best seen on sequence 3, image 78. There is high-density material along the right posterior bladder near the right ureterovesical junction. Urinary bladder is not distended. No inflammatory changes around the bladder. Stomach/Bowel: Diverticulosis in the sigmoid colon without acute inflammatory changes. No evidence for bowel obstruction or dilatation. Small amount of high-density contrast in the stomach and duodenum. Vascular/Lymphatic: Previous aortic surgery and suggestive for an aortobifemoral bypass. Vascular structures are difficult to evaluate on  this noncontrast examination. 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 This aneurysm has minimally changed in size since 09/23/2015. Patient may have bilateral renal artery grafts. No significant lymph node enlargement in the abdomen or pelvis. Reproductive: Prostate is unremarkable. Other: No free fluid.  Negative for free air. Musculoskeletal: Again noted is a fracture involving the L4 vertebral body. Patient now has pedicle screw and rod fixation at L3 and L5. IMPRESSION: 6 mm stone in the proximal left ureter without significant left hydronephrosis. There is at least 1 additional small left renal stone. High-density material along the right side of bladder near the right UVJ and there is mild dilatation of the distal right ureter. The findings raise concern for clot or a bladder lesion at this location. Recommend urology consultation. 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 Minimal change since 2016. Postsurgical changes in the aorta compatible with an aortobifemoral bypass and there is evidence for visceral artery grafts. Left ventricular assist device. Probable  stable 4 mm nodule in the left lower lobe. Stable appearance of the renal cysts. Largest left renal cyst has dystrophic calcifications. These results will be called to the ordering clinician or representative by the Radiologist Assistant, and communication documented in the PACS or zVision Dashboard. Electronically Signed   By: Markus Daft M.D.   On: 10/18/2017 10:49    Medications:    Scheduled Medications: . amiodarone  200 mg Oral Daily  . atorvastatin  80 mg Oral q1800  . cephALEXin  500 mg Oral Q8H  . levothyroxine  150 mcg Oral QAC breakfast  . metoprolol succinate  12.5 mg Oral Daily  . pantoprazole  40 mg Oral BID  . ranolazine  500 mg Oral BID  . tamsulosin  0.4 mg Oral Daily  . warfarin  1 mg Oral ONCE-1800   . 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 Syncope due to acute GI bleed and symptomatic anemia: Received 4UPRBCs.and 1FFP.  Has h/o s/p clipping of 2 jejunal AVMs in 4/16.  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 Treated with APC.  - coumadin resumed 10/17/17.  - Will need octreotide as outpatient.  2. Chronic systolic HF: s/p HM-II LVAD 10/2014. Remains NYHA II. LVAD speed reduced to 8800 in 10/17 due to RV failure.  S/p previous driveline repair. No further pump stops 3. Atrial arrhythmias: Atrial fibrillation, atrial tachycardia. S/p DC-CV 06/04/14, DC-CV 03/02/15 and 01/19/16. A-paced currently.  - Continue amio 200 mg daily.  4. HTN: MAP stable.  - Continue toprol xl 12.5 mg daily.  5. LVAD: VAD interrogated personally. Parameters stable. LDH stable. 6. CKD, stage III: Stable.  7. Anticoagulation management: Goal INR 2-2.5.  Plavix stopped this admission with GI bleeding.  INR high today, will need to adjust coumadin dose.  8. 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 S/p R CEA 01/13/16.  - Continue atorvastatin.  - Stopped plavix.  9. H/o amio-induced hyperthyroidism: Now hypothyroid on Synthroid.  Amio restarted due to recurrent atrial arrhythmias and VT.  10. VT: Underwent DC-CV 9/17.  Repeat episode of slow VT 08/17/17. Requiring DC-CV.  - Continue amio 200 mg daily.  - Keep K > 4.0 Mg > 2.0 11. Hematuria: Kidney stone on left.  Seen by urology, plan to follow to see if passes.  Added Flomax.  Started Keflex for possible UTI.    Mobilize.  Will watch in hospital to make sure hgb stays stable, slight drop today.    I reviewed the LVAD parameters from today, and compared the results to the patient's prior recorded data.  No programming changes were made.  The LVAD is functioning within specified parameters.  The patient performs LVAD self-test daily.  LVAD interrogation was negative for any significant power changes, alarms or PI events/speed drops.  LVAD equipment check completed and is in good working order.  Back-up equipment present.   LVAD education done on emergency procedures and precautions and reviewed exit site care.   Length of Stay: [place], MD 10/19/2017, 12:03 PM  VAD  Team --- VAD ISSUES ONLY--- Pager [phone] (7am - 7am)  Advanced Heart Failure Team  Pager [phone] (M-F; 7a - 4p)  Please contact [facility] for night-coverage after hours (4p -7a ) and weekends on [email or web address]

## 2017-10-20 ENCOUNTER — Encounter (HOSPITAL_COMMUNITY): Payer: Self-pay | Admitting: Gastroenterology

## 2017-10-20 LAB — BASIC METABOLIC PANEL
ANION GAP: 6 (ref 5–15)
BUN: 19 mg/dL (ref 6–20)
CHLORIDE: 107 mmol/L (ref 101–111)
CO2: 23 mmol/L (ref 22–32)
Calcium: 7.8 mg/dL — ABNORMAL LOW (ref 8.9–10.3)
Creatinine, Ser: 1.4 mg/dL — ABNORMAL HIGH (ref 0.61–1.24)
GFR calc non Af Amer: 47 mL/min — ABNORMAL LOW (ref >60)
GFR, EST AFRICAN AMERICAN: 54 mL/min — AB (ref >60)
GLUCOSE: 103 mg/dL — AB (ref 65–99)
Potassium: 4.2 mmol/L (ref 3.5–5.1)
Sodium: 136 mmol/L (ref 135–145)

## 2017-10-20 LAB — LACTATE DEHYDROGENASE: LDH: 259 U/L — AB (ref 98–192)

## 2017-10-20 LAB — CBC
HCT: 25.4 % — ABNORMAL LOW (ref 39.0–52.0)
HEMOGLOBIN: 8 g/dL — AB (ref 13.0–17.0)
MCH: 30 pg (ref 26.0–34.0)
MCHC: 31.5 g/dL (ref 30.0–36.0)
MCV: 95.1 fL (ref 78.0–100.0)
Platelets: 173 10*3/uL (ref 150–400)
RBC: 2.67 MIL/uL — AB (ref 4.22–5.81)
RDW: 18.5 % — ABNORMAL HIGH (ref 11.5–15.5)
WBC: 5 10*3/uL (ref 4.0–10.5)

## 2017-10-20 LAB — PROTIME-INR
INR: 3.35
PROTHROMBIN TIME: 33.7 s — AB (ref 11.4–15.2)

## 2017-10-20 LAB — URINE CULTURE

## 2017-10-20 NOTE — Progress Notes (Signed)
ANTICOAGULATION CONSULT NOTE - Follow Up Consult  Pharmacy Consult for Warfarin Indication: LVAD  Allergies  Allergen Reactions  . Demerol [Meperidine] Other (See Comments)    Paralysis. Could only move eyes.     Patient Measurements: Height: 6' (182.9 cm) Weight: 193 lb 5.5 oz (87.7 kg) IBW/kg (Calculated) : 77.6  Vital Signs: Temp: 97.6 F (36.4 C) (12/16 0729) Temp Source: Oral (12/16 0729) BP: 101/90 (12/16 0729) Pulse Rate: 65 (12/16 0551)  Labs: Recent Labs    10/18/17 0220 10/19/17 0332 10/20/17 0306  HGB 8.1* 8.0* 8.0*  HCT 25.4* 25.4* 25.4*  PLT 178 166 173  LABPROT 24.0* 30.0* 33.7*  INR 2.17 2.89 3.35  CREATININE 1.21 1.32* 1.40*    Estimated Creatinine Clearance: 47.7 mL/min (A) (by C-G formula based on SCr of 1.4 mg/dL (H)).  Assessment: 75yom with LVAD HM2 admitted with GIB. INR 3.93 on admit then trended up to 4.06 despite holding warfarin. He received 1 unit FFP on 12/11.   INR bump 2.1> 3.3 after restart warfarin very sensitive to even low doses warfarin this admit - Will hold dose today and let INR drift down. Endoscopy showed 2 AVMs, both treated with APC. Per Dr. Haroldine Laws, will not lower INR goal given Hx CVA. Hgb low stable 8,  s/p 4 units PRBCs total.   Warfarin held on admit  12/10 Warfarin resumed 12/13  Home dose: 2mg  daily except 4mg  Tues/Thurs/Sun  Goal of Therapy:  INR 2-2.5 Monitor platelets by anticoagulation protocol: Yes   Plan:  1) No warfarin tonight  2) Daily INR, CBC  Bonnita Nasuti Pharm.D. CPP, BCPS Clinical Pharmacist [phone] 10/20/2017 10:48 AM

## 2017-10-20 NOTE — Progress Notes (Signed)
Patient ID: [name], male   DOB: 1941-02-14, 76 y.o.   MRN: [id number]   Advanced Heart Failure VAD Team Note  Subjective:    Admitted with symptomatic anemia. Hgb on admit 6.5. Coumadin held. GI consulted.   Received total 4U PRBCs.   No BM over the last day.  Hemoglobin fairly stable, 8.1 => 8.0 => 8.0  LDH 239 -> 214 -> 249 -> 259  INR 2.89 => 3.3  CT abdomen/pelvis 12/14 with small left ureteral stone.  Seen by urology: started on Flomax.  Plan to observe to see if stone passes with time.  He was started on Keflex for treatment of possible UTI.  No fever, WBCs not elevated.   Feeling good this morning. Denies SOB or dark stools. Still dark urine.   LVAD INTERROGATION:  HeartMate II LVAD:  Flow 3.9 liters/min, speed 8800, power 4.5, PI 6.6.  15 PI events.   Objective:    Vital Signs:   Temp:  [97.6 F (36.4 C)-98.3 F (36.8 C)] 97.6 F (36.4 C) (12/16 0729) Pulse Rate:  [60-66] 65 (12/16 0551) Resp:  [12-18] 12 (12/16 0551) BP: (84-112)/(67-90) 101/90 (12/16 0729) SpO2:  [97 %-98 %] 97 % (12/16 0551) Weight:  [193 lb 5.5 oz (87.7 kg)] 193 lb 5.5 oz (87.7 kg) (12/16 0551) Last BM Date: 08/17/17   Mean arterial Pressure 80s-90  Intake/Output:   Intake/Output Summary (Last 24 hours) at 10/20/2017 1124 Last data filed at 10/20/2017 0729 Gross per 24 hour  Intake 240 ml  Output 675 ml  Net -435 ml    Physical Exam    GENERAL: Well appearing this am. NAD.  HEENT: Normal. NECK: Supple, JVP 7-8 cm. Carotids OK.  CARDIAC:  Mechanical heart sounds with LVAD hum present.  LUNGS:  CTAB, normal effort.  ABDOMEN:  NT, ND, no HSM. No bruits or masses. +BS  LVAD exit site: Well-healed and incorporated. Dressing dry and intact. No erythema or drainage. Stabilization device present and accurately applied. Driveline dressing changed daily per sterile technique. EXTREMITIES:  Warm and dry. No cyanosis, clubbing, rash, or edema.  NEUROLOGIC:  Alert & oriented x 3.  Cranial nerves grossly intact. Moves all 4 extremities w/o difficulty. Affect pleasant     Telemetry    A paced 60s with PVCs, personally reviewed.   EKG    N/A  Labs   Basic Metabolic Panel: Recent Labs  Lab 10/16/17 1855 10/17/17 0325 10/18/17 0220 10/19/17 0332 10/20/17 0306  NA 138 140 138 135 136  K 3.9 4.2 3.6 4.3 4.2  CL 111 110 110 108 107  CO2 21* 24 22 23 23   GLUCOSE 113* 106* 99 112* 103*  BUN 31* 25* 19 17 19   CREATININE 1.22 1.19 1.21 1.32* 1.40*  CALCIUM 8.2* 8.2* 8.0* 8.0* 7.8*    Liver Function Tests: No results for input(s): AST, ALT, ALKPHOS, BILITOT, PROT, ALBUMIN in the last 168 hours. No results for input(s): LIPASE, AMYLASE in the last 168 hours. No results for input(s): AMMONIA in the last 168 hours.  CBC: Recent Labs  Lab 10/16/17 1855 10/17/17 0325 10/18/17 0220 10/19/17 0332 10/20/17 0306  WBC 7.3 6.7 5.6 5.5 5.0  HGB 8.5* 8.4* 8.1* 8.0* 8.0*  HCT 26.5* 26.3* 25.4* 25.4* 25.4*  MCV 94.0 93.9 95.1 94.4 95.1  PLT 196 185 178 166 173   INR: Recent Labs  Lab 10/16/17 1855 10/17/17 0325 10/18/17 0220 10/19/17 0332 10/20/17 0306  INR 2.44 2.19 2.17 2.89 3.35  Other results:    Imaging   Dg Abd Portable 1v  Result Date: 10/18/2017 CLINICAL DATA:  Follow-up examination for ureteral calculus. EXAM: PORTABLE ABDOMEN - 1 VIEW COMPARISON:  Priors CT from earlier the same day. FINDINGS: 6 mm calcific densities seen at the left lateral aspect of the L4 vertebral body consistent with previously identified left ureteral calculus, overall little interval changed in position as compared to previous. Probable additional small left renal stone noted as well, also similar. Bowel gas pattern within normal limits. Patient status post fusion at L3 through L5 with chronic L4 compression fracture. LVAD device partially visualized. IMPRESSION: 1. No significant interval change in position of 6 mm left ureteral calculus. 2. Additional punctate left  renal stone, also unchanged. 3. Nonobstructive bowel gas pattern. Electronically Signed   By: Jeannine Boga M.D.   On: 10/18/2017 21:33    Medications:    Scheduled Medications: . amiodarone  200 mg Oral Daily  . atorvastatin  80 mg Oral q1800  . cephALEXin  500 mg Oral Q8H  . levothyroxine  150 mcg Oral QAC breakfast  . metoprolol succinate  12.5 mg Oral Daily  . pantoprazole  40 mg Oral BID  . ranolazine  500 mg Oral BID  . tamsulosin  0.4 mg Oral Daily  . Warfarin - Pharmacist Dosing Inpatient   Does not apply q1800    Infusions:   PRN Medications: acetaminophen, morphine injection, ondansetron (ZOFRAN) IV, oxyCODONE   Patient Profile   [name] is a 76 yo male with h/o VT, PAD and severe systolic HF (EF 32-99%) due to mixed ischemic/nonischemic CM he is s/p HM II LVAD implant for destination therapy on October 19, 2013.VAD admitted for recurrent GI bleed with hgb 6.5  Assessment/Plan:   1. Syncope due to acute GI bleed and symptomatic anemia: Received 4U PRBCs and 1 FFP.  Has h/o s/p clipping of 2 jejunal AVMs in 4/16.  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 Treated with APC.  - coumadin resumed 10/17/17.  - Will need octreotide as outpatient.  2. Chronic systolic HF: s/p HM-II LVAD 10/2014. Remains NYHA II. LVAD speed reduced to 8800 in 10/17 due to RV failure.  S/p previous driveline repair. No further pump stops 3. Atrial arrhythmias: Atrial fibrillation, atrial tachycardia. S/p DC-CV 06/04/14, DC-CV 03/02/15 and 01/19/16. A-paced currently.  - Continue amio 200 mg daily.  4. HTN: MAP stable.  - Continue toprol xl 12.5 mg daily.  5. LVAD: VAD interrogated personally. Parameters stable. LDH stable. 6. CKD, stage III: Stable.  7. Anticoagulation management: Goal INR 2-2.5.  Plavix stopped this admission with GI bleeding.  INR high again today, will need to adjust coumadin dose => discuss with pharmacy.   8. CVA: 12/16 with  left-sided weakness, now resolved. S/p R CEA 01/13/16.  - Continue atorvastatin.  - Stopped plavix.  9. H/o amio-induced hyperthyroidism: Now hypothyroid on Synthroid.  Amio restarted due to recurrent atrial arrhythmias and VT.  10. VT: Underwent DC-CV 9/17.  Repeat episode of slow VT 08/17/17. Requiring DC-CV.  - Continue amio 200 mg daily.  - Keep K > 4.0 Mg > 2.0 11. Hematuria: Kidney stone on left.  Seen by urology, plan to follow to see if passes.  Added Flomax.  Started Keflex for possible UTI. Urine culture with multiple species.   Mobilize.  Plan for home tomorrow.     I reviewed the LVAD parameters from today, and compared the results to the  patient's prior recorded data.  No programming changes were made.  The LVAD is functioning within specified parameters.  The patient performs LVAD self-test daily.  LVAD interrogation was negative for any significant power changes, alarms or PI events/speed drops.  LVAD equipment check completed and is in good working order.  Back-up equipment present.   LVAD education done on emergency procedures and precautions and reviewed exit site care.   Length of Stay: 6  Loralie Champagne, MD 10/20/2017, 11:24 AM  VAD Team --- VAD ISSUES ONLY--- Pager [phone] (7am - 7am)  Advanced Heart Failure Team  Pager [phone] (M-F; 7a - 4p)  Please contact [facility] for night-coverage after hours (4p -7a ) and weekends on [email or web address]

## 2017-10-21 LAB — CBC
HEMATOCRIT: 25.1 % — AB (ref 39.0–52.0)
HEMOGLOBIN: 8 g/dL — AB (ref 13.0–17.0)
MCH: 30.2 pg (ref 26.0–34.0)
MCHC: 31.9 g/dL (ref 30.0–36.0)
MCV: 94.7 fL (ref 78.0–100.0)
PLATELETS: 195 10*3/uL (ref 150–400)
RBC: 2.65 MIL/uL — AB (ref 4.22–5.81)
RDW: 18.1 % — ABNORMAL HIGH (ref 11.5–15.5)
WBC: 4.9 10*3/uL (ref 4.0–10.5)

## 2017-10-21 LAB — URINALYSIS, ROUTINE W REFLEX MICROSCOPIC
BILIRUBIN URINE: NEGATIVE
Bacteria, UA: NONE SEEN
Glucose, UA: NEGATIVE mg/dL
Ketones, ur: NEGATIVE mg/dL
NITRITE: NEGATIVE
PH: 5 (ref 5.0–8.0)
Protein, ur: 30 mg/dL — AB
SPECIFIC GRAVITY, URINE: 1.017 (ref 1.005–1.030)

## 2017-10-21 LAB — BASIC METABOLIC PANEL
ANION GAP: 5 (ref 5–15)
BUN: 18 mg/dL (ref 6–20)
CHLORIDE: 108 mmol/L (ref 101–111)
CO2: 23 mmol/L (ref 22–32)
Calcium: 7.9 mg/dL — ABNORMAL LOW (ref 8.9–10.3)
Creatinine, Ser: 1.43 mg/dL — ABNORMAL HIGH (ref 0.61–1.24)
GFR calc non Af Amer: 45 mL/min — ABNORMAL LOW (ref >60)
GFR, EST AFRICAN AMERICAN: 53 mL/min — AB (ref >60)
Glucose, Bld: 107 mg/dL — ABNORMAL HIGH (ref 65–99)
POTASSIUM: 4.1 mmol/L (ref 3.5–5.1)
SODIUM: 136 mmol/L (ref 135–145)

## 2017-10-21 LAB — PROTIME-INR
INR: 3.13
Prothrombin Time: 31.9 seconds — ABNORMAL HIGH (ref 11.4–15.2)

## 2017-10-21 LAB — PREPARE RBC (CROSSMATCH)

## 2017-10-21 LAB — LACTATE DEHYDROGENASE: LDH: 236 U/L — ABNORMAL HIGH (ref 98–192)

## 2017-10-21 MED ORDER — SODIUM CHLORIDE 0.9 % IV BOLUS (SEPSIS)
500.0000 mL | Freq: Once | INTRAVENOUS | Status: AC
  Administered 2017-10-21: 500 mL via INTRAVENOUS

## 2017-10-21 MED ORDER — WARFARIN SODIUM 1 MG PO TABS
1.0000 mg | ORAL_TABLET | Freq: Once | ORAL | Status: AC
  Administered 2017-10-21: 1 mg via ORAL
  Filled 2017-10-21: qty 1

## 2017-10-21 MED ORDER — SODIUM CHLORIDE 0.9 % IV SOLN
Freq: Once | INTRAVENOUS | Status: AC
  Administered 2017-10-21: 22:00:00 via INTRAVENOUS

## 2017-10-21 NOTE — Progress Notes (Signed)
ANTICOAGULATION CONSULT NOTE - Follow Up Consult  Pharmacy Consult for Warfarin Indication: LVAD  Allergies  Allergen Reactions  . Demerol [Meperidine] Other (See Comments)    Paralysis. Could only move eyes.     Patient Measurements: Height: 6' (182.9 cm) Weight: 195 lb 1.7 oz (88.5 kg) IBW/kg (Calculated) : 77.6  Vital Signs: Temp: 98.1 F (36.7 C) (12/17 1104) Temp Source: Oral (12/17 1104) BP: 77/63 (12/17 1104) Pulse Rate: 65 (12/17 1104)  Labs: Recent Labs    10/19/17 0332 10/20/17 0306 10/21/17 0242  HGB 8.0* 8.0* 8.0*  HCT 25.4* 25.4* 25.1*  PLT 166 173 195  LABPROT 30.0* 33.7* 31.9*  INR 2.89 3.35 3.13  CREATININE 1.32* 1.40* 1.43*    Estimated Creatinine Clearance: 46.7 mL/min (A) (by C-G formula based on SCr of 1.43 mg/dL (H)).  Assessment: 75yom with LVAD HM2 admitted with GIB. INR 3.93 on admit then trended up to 4.06 despite holding warfarin. He received 1 unit FFP on 12/11.   INR bump 2.1> 3.3 after restart warfarin very sensitive to even low doses warfarin this admit - held yesterday INR dose 3.1 - to prevent falling too low will give warfarin 1mg  x1 tonight  Endoscopy showed 2 AVMs, both treated with APC. Per Dr. Haroldine Laws, will not lower INR goal given Hx CVA. Hgb low stable 8,  s/p 4 units PRBCs total.   Warfarin held on admit  12/10 Warfarin resumed 12/13  Home dose: 2mg  daily except 4mg  Tues/Thurs/Sun  Goal of Therapy:  INR 2-2.5 Monitor platelets by anticoagulation protocol: Yes   Plan:  1)  Warfarin 1mg  x1 tonight  2) Daily INR, CBC  Bonnita Nasuti Pharm.D. CPP, BCPS Clinical Pharmacist [phone] 10/21/2017 12:21 PM

## 2017-10-21 NOTE — Progress Notes (Signed)
LVAD Coordinator Rounding Note:  Admitted 10/14/17 due to GIB. Hgb 6.5 on arrival.   HM II LVAD implated on 10/19/13 by Dr. Cyndia Bent under Destination Therapy criteria.  Vital signs: Temp:  98.1 HR: 65 Doppler Pressure:90 Automatic BP: 98/82 O2 Sat:  98 % RA Wt: 199>197>192>196>196>192>193>193>195 lbs   LVAD interrogation reveals:  Speed: 8800 Flow: 4.1 Power:4.5w PI:  7.1 Alarms:none  Events:90 PI events on 10/20/17  Fixed speed: 8800 Low speed limit: 8200   Drive Line: Weekly dressing kit. Due 12/23. On exam, dressing CDI with anchor present and accurately applied.   Blood products this admission: 4 units/PRBC on 10/14/17 1 FFP, 1 PRBC 10/15/17  Hgb trend:  6.5>7.6>8.6>8.0>8.6>8.3>8.5>8.4>8.1>8.0>8.0>8.0  INR trend: 3.93>3.75>4.06>2.86>2.89>2.44>2.19>2.17>2.89>3.35>3.13  LDH trend: 279>235>215>256>239>214>249>236   Anticoagulation Plan: - INR Goal: 2.0 - 2.5 - ASA Dose: no ASA due to hx of GI bleed - Hx of TIA/CVA pt on Plavix - on hold for upcoming procedure  Device: - Medtronic dual -Therapies: on at 200 bpm  Adverse Events on VAD: 02/2015 >>GIB (removed ASA, 2-2.5 INR) 10/2015 >>CVA(Plavix started) 02/2017> GIB (removed ASA, scopes negative) 03/2017>percutaneous lead repair History of amiodarone induced-thyrotoxicosis 12/18>GIB with push enteroscopy; APC therapy on 2 small AVMs  Plan/Recommendations:  1. Call VAD pager for any equipment needs.   Zada Girt RN, VAD Coordinator 24/7 pager [phone]

## 2017-10-21 NOTE — Progress Notes (Signed)
Patient ID: [name], male   DOB: 05-06-1941, 76 y.o.   MRN: [id number]   Advanced Heart Failure VAD Team Note  Subjective:    Admitted with symptomatic anemia. Hgb on admit 6.5. Coumadin held. GI consulted.   Received total 4U PRBCs.   Hgb stable. Denies bleeding. 8.1 => 8.0 => 8.0 => 8.0  LDH 239 -> 214 -> 249 -> 259  INR 2.89 => 3.3 => 3.13  CT abdomen/pelvis 12/14 with small left ureteral stone.  Seen by urology: started on Flomax.  Plan had been for trial of passage.   Afebrile. No WBCs (4.9 today). Denies pain, nausea or vomiting. Frustrated this morning. Continues to have Dark urine. Denies SOB or dark stools.   LVAD INTERROGATION:  HeartMate II LVAD:  Flow 3.6 liters/min, speed 8800, power 4.0, PI 8.1. 15-20 PI events.   Objective:    Vital Signs:   Temp:  [97.7 F (36.5 C)-98.3 F (36.8 C)] 98 F (36.7 C) (12/17 0724) Pulse Rate:  [62] 62 (12/17 0724) Resp:  [14-21] 17 (12/17 0724) BP: (75-99)/(56-84) 98/82 (12/17 0724) SpO2:  [98 %] 98 % (12/17 0724) Weight:  [195 lb 1.7 oz (88.5 kg)] 195 lb 1.7 oz (88.5 kg) (12/17 0617) Last BM Date: 10/20/17   Mean arterial Pressure 89  Intake/Output:   Intake/Output Summary (Last 24 hours) at 10/21/2017 0944 Last data filed at 10/21/2017 0845 Gross per 24 hour  Intake 240 ml  Output 700 ml  Net -460 ml    Physical Exam    General: Well appearing. No resp difficulty. HEENT: Normal Neck: Supple. JVP 5-6. Carotids 2+ bilat; no bruits. No thyromegaly or nodule noted. Cor: PMI nondisplaced. RRR, No M/G/R noted Lungs: CTAB, normal effort. Abdomen: Soft, non-tender, non-distended, no HSM. No bruits or masses. +BS  Extremities: No cyanosis, clubbing, or rash. R and LLE no edema.  Neuro: Alert & orientedx3, cranial nerves grossly intact. moves all 4 extremities w/o difficulty. Affect pleasant   Telemetry    A paced 60s with PVCs, personally reviewed.   EKG    N/A   Labs   Basic Metabolic Panel: Recent  Labs  Lab 10/17/17 0325 10/18/17 0220 10/19/17 0332 10/20/17 0306 10/21/17 0242  NA 140 138 135 136 136  K 4.2 3.6 4.3 4.2 4.1  CL 110 110 108 107 108  CO2 24 22 23 23 23   GLUCOSE 106* 99 112* 103* 107*  BUN 25* 19 17 19 18   CREATININE 1.19 1.21 1.32* 1.40* 1.43*  CALCIUM 8.2* 8.0* 8.0* 7.8* 7.9*    Liver Function Tests: No results for input(s): AST, ALT, ALKPHOS, BILITOT, PROT, ALBUMIN in the last 168 hours. No results for input(s): LIPASE, AMYLASE in the last 168 hours. No results for input(s): AMMONIA in the last 168 hours.  CBC: Recent Labs  Lab 10/17/17 0325 10/18/17 0220 10/19/17 0332 10/20/17 0306 10/21/17 0242  WBC 6.7 5.6 5.5 5.0 4.9  HGB 8.4* 8.1* 8.0* 8.0* 8.0*  HCT 26.3* 25.4* 25.4* 25.4* 25.1*  MCV 93.9 95.1 94.4 95.1 94.7  PLT 185 178 166 173 195   INR: Recent Labs  Lab 10/17/17 0325 10/18/17 0220 10/19/17 0332 10/20/17 0306 10/21/17 0242  INR 2.19 2.17 2.89 3.35 3.13   Other results:    Imaging   No results found.  Medications:    Scheduled Medications: . amiodarone  200 mg Oral Daily  . atorvastatin  80 mg Oral q1800  . cephALEXin  500 mg Oral Q8H  . levothyroxine  150 mcg Oral QAC breakfast  . metoprolol succinate  12.5 mg Oral Daily  . pantoprazole  40 mg Oral BID  . ranolazine  500 mg Oral BID  . tamsulosin  0.4 mg Oral Daily  . 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 Syncope due to acute GI bleed and symptomatic anemia: Received 4U PRBCs and 1 FFP.  Has h/o s/p clipping of 2 jejunal AVMs in 4/16.  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 Treated with APC.  - Coumadin resumed 10/17/17. INR 3.1 today.  - Will need octreotide as outpatient.  2. Chronic systolic HF: s/p HM-II LVAD 10/2014. Remains NYHA II. LVAD speed reduced to 8800 in 10/17 due to RV failure.  S/p previous driveline repair. No further pump stops.  3. Atrial arrhythmias: Atrial fibrillation, atrial tachycardia. S/p DC-CV 06/04/14, DC-CV 03/02/15 and 01/19/16. A-paced currently.  - Continue amio 200 mg daily. No change.  4. HTN: MAPs stable.  - Continue toprol xl 12.5 mg daily.  5. LVAD: VAD interrogated personally. Parameters stable as above. LDH stable. interrogated personally.  6. CKD, stage III: Stable.  7. Anticoagulation management: Goal INR 2-2.5.  Plavix stopped this admission with GI bleeding.   - INR high again today, adjusting dosing => Discuss with pharmacy.   Held last night. May need to discuss urology plans before dosing further.  8. 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 S/p R CEA 01/13/16.  - Continue atorvastatin.  - Stopped plavix.   9. H/o amio-induced hyperthyroidism: Now hypothyroid on Synthroid.  Amio restarted due to recurrent atrial arrhythmias and VT.  10. VT: Underwent DC-CV 9/17.  Repeat episode of slow VT 08/17/17. Requiring DC-CV.  - Continue amio 200 mg daily.  - Keep K > 4.0 Mg > 2.0. No change.  11. Hematuria: Kidney stone on left.  Seen by urology, Had plan to follow if passes. - Continue flomax and keflex. - Recheck UA today. Remains asymptomatic apart from dark urine.  - Discussed with Urology.  They recommend continued trial of passage, especially with lack of symptoms. He is a poor surgical candidate with LVAD in place per Dr. Jeffie Pollock.   Stable for home from CHF/GI perspective. Will discuss Urology recommendations with patient and Dr. Haroldine Laws.  I reviewed the LVAD parameters from today, and compared the results to the patient's prior recorded data.  No  programming changes were made.  The LVAD is functioning within specified parameters.  The patient performs LVAD self-test daily.  LVAD interrogation was negative for any significant power changes, alarms or PI events/speed drops.  LVAD equipment check completed and is in good working order.  Back-up equipment present.   LVAD education done on emergency procedures and precautions and reviewed exit site care.   Length of Stay: [address]  Annamaria Helling 10/21/2017, 9:44 AM  VAD Team --- VAD ISSUES ONLY--- Pager [phone] (7am - 7am)  Advanced Heart Failure Team  Pager [phone] (M-F; 7a - 4p)  Please contact [facility] for night-coverage after hours (4p -7a ) and  weekends on [email or web address]  Patient seen and examined with the above-signed Advanced Practice Provider and/or Housestaff. I personally reviewed laboratory data, imaging studies and relevant notes. I independently examined the patient and formulated the important aspects of the plan. I have edited the note to reflect any of my changes or salient points. I have personally discussed the plan with the patient and/or family.  He feels very weak today. Lightheaded wall walking down the hall. Multiple PI events on VAD. No further dark stools. Hgb stable at 8.0. Still with dark urine. No fevers or chills. Rhythm stable.   Will give 250cc fluid and another unit RBCs.   Still has not passed stone but denies pain or fevers. D/w urology who suggested continuing with conservative measures. Continue flomax and keflex. Recheck UA.   INR remains high. Dosing d/w PharmD. Holding Plavix for now. LDH 236. VAD interrogated personally. Parameters stable.  Glori Bickers, MD  9:52 PM

## 2017-10-21 NOTE — Progress Notes (Signed)
Pt getting fluid after he felt dizzy and weak with walking earlier. He wanted to try again walking however BP still low. Left arm with machine MAP 76. Right arm doppler 74. Will hold for now, he can walk later after fluid in.  [id number] Brooks, ACSM 3:25 PM 10/21/2017

## 2017-10-22 LAB — BASIC METABOLIC PANEL
Anion gap: 4 — ABNORMAL LOW (ref 5–15)
BUN: 18 mg/dL (ref 6–20)
CHLORIDE: 108 mmol/L (ref 101–111)
CO2: 24 mmol/L (ref 22–32)
CREATININE: 1.51 mg/dL — AB (ref 0.61–1.24)
Calcium: 8.2 mg/dL — ABNORMAL LOW (ref 8.9–10.3)
GFR calc Af Amer: 49 mL/min — ABNORMAL LOW (ref >60)
GFR, EST NON AFRICAN AMERICAN: 42 mL/min — AB (ref >60)
GLUCOSE: 117 mg/dL — AB (ref 65–99)
POTASSIUM: 4.4 mmol/L (ref 3.5–5.1)
SODIUM: 136 mmol/L (ref 135–145)

## 2017-10-22 LAB — CBC
HEMATOCRIT: 28.1 % — AB (ref 39.0–52.0)
Hemoglobin: 8.9 g/dL — ABNORMAL LOW (ref 13.0–17.0)
MCH: 29.3 pg (ref 26.0–34.0)
MCHC: 31.7 g/dL (ref 30.0–36.0)
MCV: 92.4 fL (ref 78.0–100.0)
PLATELETS: 189 10*3/uL (ref 150–400)
RBC: 3.04 MIL/uL — ABNORMAL LOW (ref 4.22–5.81)
RDW: 18.8 % — AB (ref 11.5–15.5)
WBC: 5 10*3/uL (ref 4.0–10.5)

## 2017-10-22 LAB — BPAM RBC
BLOOD PRODUCT EXPIRATION DATE: 201901072359
ISSUE DATE / TIME: 201812172201
Unit Type and Rh: 5100

## 2017-10-22 LAB — TYPE AND SCREEN
ABO/RH(D): O POS
Antibody Screen: NEGATIVE
UNIT DIVISION: 0

## 2017-10-22 LAB — PROTIME-INR
INR: 2.49
Prothrombin Time: 26.7 seconds — ABNORMAL HIGH (ref 11.4–15.2)

## 2017-10-22 LAB — LACTATE DEHYDROGENASE: LDH: 250 U/L — AB (ref 98–192)

## 2017-10-22 MED ORDER — WARFARIN SODIUM 2 MG PO TABS
2.0000 mg | ORAL_TABLET | Freq: Once | ORAL | Status: AC
  Administered 2017-10-22: 2 mg via ORAL
  Filled 2017-10-22: qty 1

## 2017-10-22 MED ORDER — METOPROLOL SUCCINATE ER 25 MG PO TB24: 12.5000 mg | ORAL_TABLET | Freq: Every day | ORAL | 6 refills | Status: DC

## 2017-10-22 MED ORDER — TAMSULOSIN HCL 0.4 MG PO CAPS
0.4000 mg | ORAL_CAPSULE | Freq: Every day | ORAL | 3 refills | Status: DC
Start: 2017-10-23 — End: 2018-01-10

## 2017-10-22 MED ORDER — CEPHALEXIN 500 MG PO CAPS: 500.0000 mg | ORAL_CAPSULE | Freq: Three times a day (TID) | ORAL | 0 refills | Status: DC

## 2017-10-22 NOTE — Progress Notes (Signed)
Patient ID: [name], male   DOB: 1941-05-10, 76 y.o.   MRN: [id number]   Advanced Heart Failure VAD Team Note  Subjective:    Admitted with symptomatic anemia. Hgb on admit 6.5. Coumadin held. GI consulted.   Hgb stable. Denies bleeding. 8.1 => 8.0 => 8.0 => 8.0 => 8.9 s/p 1 UPRBCs (total of 5 this admission.   LDH 239 -> 214 -> 249 -> 259 -> 250  INR 2.89 => 3.3 => 3.13 => 2.49  CT abdomen/pelvis 12/14 with small left ureteral stone.  Seen by urology: started on Flomax.  Plan had been for trial of passage.   Received blood and 500 cc NS yesterday with lightheadedness and dizziness.   Much improved this am.  No further lightheadedness and dizziness since received IVF and blood. Last 3 UOP have been yellow. No further hematuria.   LVAD INTERROGATION:  HeartMate II LVAD:  Flow 3.5 liters/min, speed 8800, power 4.1, PI 8.0. Only 2 PI events since received 1 uPRBCs.   Objective:    Vital Signs:   Temp:  [97.6 F (36.4 C)-98.3 F (36.8 C)] 98.3 F (36.8 C) (12/18 0730) Pulse Rate:  [60-66] 65 (12/18 0332) Resp:  [12-17] 16 (12/18 0730) BP: (77-95)/(63-83) 93/78 (12/18 0730) SpO2:  [98 %-99 %] 99 % (12/18 0332) Weight:  [198 lb 10.2 oz (90.1 kg)] 198 lb 10.2 oz (90.1 kg) (12/18 0356) Last BM Date: 10/20/17   Mean arterial Pressure 80-90s  Intake/Output:   Intake/Output Summary (Last 24 hours) at 10/22/2017 0922 Last data filed at 10/22/2017 0038 Gross per 24 hour  Intake 555 ml  Output 300 ml  Net 255 ml    Physical Exam    GENERAL: Well appearing this am. Pallor much improved. NAD.  HEENT: Normal. NECK: Supple, JVP 7-8 cm. Carotids OK.  CARDIAC:  Mechanical heart sounds with LVAD hum present.  LUNGS:  CTAB, normal effort.  ABDOMEN:  NT, ND, no HSM. No bruits or masses. +BS  LVAD exit site: Well-healed and incorporated. Dressing dry and intact. No erythema or drainage. Stabilization device present and accurately applied. Driveline dressing changed daily per  sterile technique. EXTREMITIES:  Warm and dry. No cyanosis, clubbing, rash, or edema.  NEUROLOGIC:  Alert & oriented x 3. Cranial nerves grossly intact. Moves all 4 extremities w/o difficulty. Affect pleasant     Telemetry    A paced 60s with occasional PVCs, personally reviewed.   EKG    N/A   Labs   Basic Metabolic Panel: Recent Labs  Lab 10/18/17 0220 10/19/17 0332 10/20/17 0306 10/21/17 0242 10/22/17 0253  NA 138 135 136 136 136  K 3.6 4.3 4.2 4.1 4.4  CL 110 108 107 108 108  CO2 22 23 23 23 24   GLUCOSE 99 112* 103* 107* 117*  BUN 19 17 19 18 18   CREATININE 1.21 1.32* 1.40* 1.43* 1.51*  CALCIUM 8.0* 8.0* 7.8* 7.9* 8.2*    Liver Function Tests: No results for input(s): AST, ALT, ALKPHOS, BILITOT, PROT, ALBUMIN in the last 168 hours. No results for input(s): LIPASE, AMYLASE in the last 168 hours. No results for input(s): AMMONIA in the last 168 hours.  CBC: Recent Labs  Lab 10/18/17 0220 10/19/17 0332 10/20/17 0306 10/21/17 0242 10/22/17 0253  WBC 5.6 5.5 5.0 4.9 5.0  HGB 8.1* 8.0* 8.0* 8.0* 8.9*  HCT 25.4* 25.4* 25.4* 25.1* 28.1*  MCV 95.1 94.4 95.1 94.7 92.4  PLT 178 166 173 195 189   INR: Recent Labs  Lab 10/18/17 0220 10/19/17 0332 10/20/17 0306 10/21/17 0242 10/22/17 0253  INR 2.17 2.89 3.35 3.13 2.49   Other results:    Imaging   No results found.  Medications:    Scheduled Medications: . amiodarone  200 mg Oral Daily  . atorvastatin  80 mg Oral q1800  . cephALEXin  500 mg Oral Q8H  . levothyroxine  150 mcg Oral QAC breakfast  . metoprolol succinate  12.5 mg Oral Daily  . pantoprazole  40 mg Oral BID  . ranolazine  500 mg Oral BID  . tamsulosin  0.4 mg Oral Daily  . Warfarin - Pharmacist Dosing Inpatient   Does not apply q1800    Infusions:   PRN Medications: acetaminophen, morphine injection, ondansetron (ZOFRAN) IV, oxyCODONE   Patient Profile   [name] is a 76 yo male with h/o VT, PAD and severe systolic HF  (EF 67-20%) due to mixed ischemic/nonischemic CM he is s/p HM II LVAD implant for destination therapy on October 19, 2013.VAD admitted for recurrent GI bleed with hgb 6.5  Assessment/Plan:   1. Syncope due to acute GI bleed and symptomatic anemia: Received 4U PRBCs and 1 FFP.  Has h/o s/p clipping of 2 jejunal AVMs in 4/16.  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 Treated with APC.  - Coumadin resumed 10/17/17. INR 2.4 today. Will give small dose tonight, then resume home dosage.  - Will need octreotide as outpatient.  2. Chronic systolic HF: s/p HM-II LVAD 10/2014. Remains NYHA II. LVAD speed reduced to 8800 in 10/17 due to RV failure.  S/p previous driveline repair. No further pump stops. No change.  3. Atrial arrhythmias: Atrial fibrillation, atrial tachycardia. S/p DC-CV 06/04/14, DC-CV 03/02/15 and 01/19/16. A-paced currently.  - Continue amio 200 mg daily. No change.  4. HTN: MAPs stable.  - Continue toprol xl 12.5 mg daily.  5. LVAD: VAD interrogated personally. Parameters stable as above. LDH stable.  - Interrogated personally.  6. CKD, stage III: Stable.  7. Anticoagulation management: Goal INR 2-2.5.  Plavix stopped this admission with GI bleeding.   - INR 2.4. Discussed dosing with pharmacy.  8. 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 S/p R CEA 01/13/16.  - Continue atorvastatin.  - Stopped plavix with bleed.  9. H/o amio-induced hyperthyroidism: Now hypothyroid on Synthroid.  Amio restarted due to recurrent atrial arrhythmias and VT. No change.  10. VT: Underwent DC-CV 9/17.  Repeat episode of slow VT 08/17/17. Requiring DC-CV.  - Continue amio 200 mg daily.  - Keep K > 4.0 Mg > 2.0. No change.  11. Hematuria: Kidney stone on left.  Seen by urology, Had plan to follow if passes. - Continue flomax and keflex. - Urine now clearing.  - Pt has urology follow up Friday. Placed in AVS.   Much improved s/p IVF and 1 further UPRBCs. Urine  clearing. No further hematuria since last night.   Likely home today.   I reviewed the LVAD parameters from today, and compared the results to the patient's prior recorded data.  No programming changes were made.  The LVAD is functioning within specified parameters.  The patient performs LVAD self-test daily.  LVAD interrogation was negative for any significant power changes, alarms or PI events/speed drops.  LVAD equipment check completed and is in good working order.  Back-up equipment present.   LVAD education done on emergency procedures and precautions and reviewed exit site care.   Length of Stay: 8  Frank Friar, PA-C 10/22/2017, 9:22 AM  VAD Team --- VAD ISSUES ONLY--- Pager [phone] (7am - 7am)  Advanced Heart Failure Team  Pager [phone] (M-F; 7a - 4p)  Please contact [facility] for night-coverage after hours (4p -7a ) and weekends on [email or web address]  Patient seen and examined with the above-signed Advanced Practice Provider and/or Housestaff. I personally reviewed laboratory data, imaging studies and relevant notes. I independently examined the patient and formulated the important aspects of the plan. I have edited the note to reflect any of my changes or salient points. I have personally discussed the plan with the patient and/or family.  Feels much better after transfusion. INR looks good. Urine has cleared. No fevers/chills. VAD numbers improved. OK for d/c today. Will see back in Clinic on Friday.  Glori Bickers, MD  2:04 PM

## 2017-10-22 NOTE — Discharge Summary (Signed)
Advanced Heart Failure Team  Discharge Summary   Patient ID: [name] MRN: [id number], DOB/AGE: [birth year] 76 y.o. Admit date: 10/14/2017 D/C date:     10/22/2017   Primary Discharge Diagnoses:  1.Syncope due to acute GI bleed and symptomatic anemia: Received 5U PRBCs and 1 FFP.  Has  2. Chronic systolic HF: s/p HM-II LVAD 10/2014.  3. Atrial arrhythmias 4. HTN 5. LVAD 6. CKD, stage III 7. Anticoagulation management 8. CVA 9. H/o amio-induced hyperthyroidism 10. VT 11. Hematuria -> Kidney stone on left.    Hospital Course:   [name] is a 76 y.o. male with h/o VT, PAD and severe systolic HF (EF 54-27%) due to mixed ischemic/nonischemic CM he is s/p HM II LVAD implant for destination therapy on October 19, 2013. VAD ptadmitted for recurrent GI bleed with hgb 6.5.   Pt noted to have frequent PI and LOW FLOW events on VAD interrogation in setting of GI bleed.   Given 4 UPRBCs on admit. GI consulted with history of GI bleeds.  Given additional 1 uPRBC and 1 FFP on 10/15/17. Pt continued to have black stools.   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 Treated with APC. Coumadin resumed evening of 10/17/17.   On 10/18/17 pt developed dark hematuria. With h/o of kidney stones, CT renal study ordered which showed 6 mm stone in left proximal ureter. Urology consulted who recommended Trial of passage on flomax, and starting Keflex for UTI prophylaxis.   Pt kept over weekend with continued hematuria and mild lightheadedness.  Am of 10/21/17 pt continued with hematuria. Noted to be lightheaded with increased PI events overnight. Given 500 cc of NS and 1 uPRBCs with Hgb persistently low at 8.0.  Pts PI events improved into 10/22/17.  Urine cleared overnight with UOP x 3+ with clearing, yellow urine.   Pt examined am of 10/22/17 and thought stable for discharge to home with close follow up as below.   LVAD Interrogation HM II:  Speed: 8800  Flow: 3.5 PI: 8.0 Power: 4.1  Back-up speed: 8200  Discharge Weight: 198 lbs Discharge Vitals: Blood pressure (!) 84/72, pulse 63, temperature 98 F (36.7 C), temperature source Oral, resp. rate 12, height 6' (1.829 m), weight 198 lb 10.2 oz (90.1 kg), SpO2 100 %.  Labs: Lab Results  Component Value Date   WBC 5.0 10/22/2017   HGB 8.9 (L) 10/22/2017   HCT 28.1 (L) 10/22/2017   MCV 92.4 10/22/2017   PLT 189 10/22/2017    Recent Labs  Lab 10/22/17 0253  NA 136  K 4.4  CL 108  CO2 24  BUN 18  CREATININE 1.51*  CALCIUM 8.2*  GLUCOSE 117*   Lab Results  Component Value Date   CHOL 106 11/05/2015   HDL 33 (L) 11/05/2015   LDLCALC 60 11/05/2015   TRIG 67 11/05/2015   BNP (last 3 results) Recent Labs    08/17/17 1900  BNP 627.8*    ProBNP (last 3 results) No results for input(s): PROBNP in the last 8760 hours.   Diagnostic Studies/Procedures   No results found.  Discharge Medications   Allergies as of 10/22/2017      Reactions   Demerol [meperidine] Other (See Comments)   Paralysis. Could only move eyes.      Medication List    STOP taking these medications   clopidogrel 75 MG tablet Commonly known as:  PLAVIX     TAKE these medications   amiodarone  200 MG tablet Commonly known as:  PACERONE Take 200 mg by mouth daily.   atorvastatin 80 MG tablet Commonly known as:  LIPITOR TAKE 1 TABLET DAILY AT 6 PM What changed:  See the new instructions.   cephALEXin 500 MG capsule Commonly known as:  KEFLEX Take 1 capsule (500 mg total) by mouth every 8 (eight) hours.   docusate sodium 100 MG capsule Commonly known as:  COLACE Take 200 mg by mouth every morning.   furosemide 20 MG tablet Commonly known as:  LASIX Take 20 mg by mouth daily as needed for fluid.   ICAPS PO Take 1 tablet by mouth 2 (two) times daily.   levothyroxine 200 MCG tablet Commonly known as:  SYNTHROID, LEVOTHROID Take 200-300 mcg by mouth daily. Take 200 mcg (1 tab) by mouth  daily except take 300 mcg (1 and 1/2 tab) by mouth every Monday.   metoprolol succinate 25 MG 24 hr tablet Commonly known as:  TOPROL-XL Take 0.5 tablets (12.5 mg total) by mouth daily. Start taking on:  10/23/2017 What changed:    how much to take  when to take this   nitroGLYCERIN 0.4 MG SL tablet Commonly known as:  NITROSTAT Place 0.4 mg under the tongue every 5 (five) minutes as needed for chest pain.   pantoprazole 40 MG tablet Commonly known as:  PROTONIX Take 1 tablet (40 mg total) by mouth 2 (two) times daily.   ranolazine 500 MG 12 hr tablet Commonly known as:  RANEXA Take 1 tablet (500 mg total) by mouth 2 (two) times daily.   tamsulosin 0.4 MG Caps capsule Commonly known as:  FLOMAX Take 1 capsule (0.4 mg total) by mouth daily. Start taking on:  10/23/2017   warfarin 2 MG tablet Commonly known as:  COUMADIN Take as directed. If you are unsure how to take this medication, talk to your nurse or doctor. Original instructions:  Take 2-4 mg by mouth daily. Take 2 mg (1 tablet) daily except 4 mg (2 tablets) on Tues, Thurs, Sun.   zinc gluconate 50 MG tablet Take 50 mg by mouth 2 (two) times daily.       Disposition   The patient will be discharged in stable condition to home with close follow up as below. Discharge Instructions    (HEART FAILURE PATIENTS) Call MD:  Anytime you have any of the following symptoms: 1) 3 pound weight gain in 24 hours or 5 pounds in 1 week 2) shortness of breath, with or without a dry hacking cough 3) swelling in the hands, feet or stomach 4) if you have to sleep on extra pillows at night in order to breathe.   Complete by:  As directed    Diet - low sodium heart healthy   Complete by:  As directed    Increase activity slowly   Complete by:  As directed    STOP any activity that causes chest pain, shortness of breath, dizziness, sweating, or exessive weakness   Complete by:  As directed      Follow-up Information    [facility] Follow up on 10/25/2017.   Specialty:  Cardiology Why:  at 1 pm for post hospital follow up. The code for parking is 8002. Contact information: [address] [phone] [facility] Follow up on 10/25/2017.   Why:  at 1100 am with Dr. Gloriann Loan for post  hospital follow up.  Contact information: [place] [phone]            Duration of Discharge Encounter: Greater than 35 minutes   Signed, Annamaria Helling  10/22/2017, 1:47 PM  Patient seen and examined with the above-signed Advanced Practice Provider and/or Housestaff. I personally reviewed laboratory data, imaging studies and relevant notes. I independently examined the patient and formulated the important aspects of the plan. I have edited the note to reflect any of my changes or salient points. I have personally discussed the plan with the patient and/or family.  Feels much better after transfusion. INR looks good. Urine has cleared. No fevers/chills. VAD numbers improved. OK for d/c today. Will see back in Clinic on Friday.  Glori Bickers, MD  2:04 PM

## 2017-10-22 NOTE — Progress Notes (Signed)
LVAD Coordinator Rounding Note:  Admitted 10/14/17 due to GIB. Hgb 6.5 on arrival.   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 Bartle under Destination Therapy criteria.  Pt is sitting up in the chair and is happy because he is pain free and his urine is clear.   Vital signs: Temp:  98.3 HR: 62 Doppler Pressure:90 Automatic BP: 93/78 (85) O2 Sat:  98 % RA Wt: 199>197>192>196>196>192>193>193>195>198 lbs   LVAD interrogation reveals:  Speed: 8800 Flow: 3.8 Power:4.5w PI:  7.3 Alarms:none  Events:2 PI event overnight  Fixed speed: 8800 Low speed limit: 8200   Drive Line: Weekly dressing kit. Due 12/23. On exam, dressing CDI with anchor present and accurately applied.   Blood products this admission: 4 units/PRBC on 10/14/17 1 FFP, 1 PRBC 10/15/17 1 unit/PRBC on 10/21/17  Hgb trend:  6.5>7.6>8.6>8.0>8.6>8.3>8.5>8.4>8.1>8.0>8.0>8.0>8.9  INR trend: 3.93>3.75>4.06>2.86>2.89>2.44>2.19>2.17>2.89>3.35>3.13>2.46  LDH trend: 279>235>215>256>239>214>249>236>244   Anticoagulation Plan: - INR Goal: 2.0 - 2.5 - ASA Dose: no ASA due to hx of GI bleed - Hx of TIA/CVA pt on Plavix - on hold for upcoming procedure  Device: - Medtronic dual -Therapies: on at 200 bpm  Adverse Events on VAD: 02/2015 >>GIB (removed ASA, 2-2.5 INR) 10/2015 >>CVA(Plavix started) 02/2017> GIB (removed ASA, scopes negative) 03/2017>percutaneous lead repair History of amiodarone induced-thyrotoxicosis 12/18>GIB with push enteroscopy; APC therapy on 2 small AVMs  Plan/Recommendations:  1. Call VAD pager for any equipment needs. 2. Possible d/c today. Pt will need labs on Friday and follow up next week. VAD coordinator will schedule.   Sarah Herbert RN, VAD Coordinator 24/7 pager [phone]   

## 2017-10-22 NOTE — Progress Notes (Signed)
Discharge note. Patient and significant other educated at bedside. RN educated on changes in medications, when to take medications, heart failure instructions, signs and symptoms to look for, when to call the MD, information on antibiotics, and diet and activity recommendations.   PIV removed without complications.   Patient discharged in wheelchair by NT.

## 2017-10-24 ENCOUNTER — Encounter (HOSPITAL_COMMUNITY): Payer: Medicare Other

## 2017-10-25 ENCOUNTER — Encounter (HOSPITAL_COMMUNITY): Payer: Self-pay

## 2017-10-25 ENCOUNTER — Ambulatory Visit (HOSPITAL_COMMUNITY)
Admit: 2017-10-25 | Discharge: 2017-10-25 | Disposition: A | Discharge status: Home / Self Care | Payer: Medicare Other | Attending: Internal Medicine | Admitting: Internal Medicine

## 2017-10-25 ENCOUNTER — Other Ambulatory Visit (HOSPITAL_COMMUNITY): Payer: Self-pay | Admitting: *Deleted

## 2017-10-25 ENCOUNTER — Ambulatory Visit (HOSPITAL_COMMUNITY): Payer: Self-pay | Admitting: Pharmacist

## 2017-10-25 VITALS — BP 93/66 | HR 75 | Ht 72.0 in | Wt 205.0 lb

## 2017-10-25 DIAGNOSIS — I480 Paroxysmal atrial fibrillation: Secondary | ICD-10-CM

## 2017-10-25 DIAGNOSIS — E079 Disorder of thyroid, unspecified: Secondary | ICD-10-CM | POA: Diagnosis not present

## 2017-10-25 DIAGNOSIS — Z8673 Personal history of transient ischemic attack (TIA), and cerebral infarction without residual deficits: Secondary | ICD-10-CM | POA: Diagnosis not present

## 2017-10-25 DIAGNOSIS — I739 Peripheral vascular disease, unspecified: Secondary | ICD-10-CM | POA: Diagnosis not present

## 2017-10-25 DIAGNOSIS — Z79899 Other long term (current) drug therapy: Secondary | ICD-10-CM | POA: Insufficient documentation

## 2017-10-25 DIAGNOSIS — E039 Hypothyroidism, unspecified: Secondary | ICD-10-CM | POA: Diagnosis not present

## 2017-10-25 DIAGNOSIS — I2510 Atherosclerotic heart disease of native coronary artery without angina pectoris: Secondary | ICD-10-CM | POA: Diagnosis not present

## 2017-10-25 DIAGNOSIS — Z9889 Other specified postprocedural states: Secondary | ICD-10-CM | POA: Insufficient documentation

## 2017-10-25 DIAGNOSIS — I472 Ventricular tachycardia, unspecified: Secondary | ICD-10-CM

## 2017-10-25 DIAGNOSIS — Z87442 Personal history of urinary calculi: Secondary | ICD-10-CM | POA: Insufficient documentation

## 2017-10-25 DIAGNOSIS — Z95810 Presence of automatic (implantable) cardiac defibrillator: Secondary | ICD-10-CM | POA: Insufficient documentation

## 2017-10-25 DIAGNOSIS — I714 Abdominal aortic aneurysm, without rupture: Secondary | ICD-10-CM | POA: Insufficient documentation

## 2017-10-25 DIAGNOSIS — E0580 Other thyrotoxicosis without thyrotoxic crisis or storm: Secondary | ICD-10-CM | POA: Diagnosis not present

## 2017-10-25 DIAGNOSIS — N183 Chronic kidney disease, stage 3 (moderate): Secondary | ICD-10-CM | POA: Diagnosis not present

## 2017-10-25 DIAGNOSIS — Z7902 Long term (current) use of antithrombotics/antiplatelets: Secondary | ICD-10-CM | POA: Diagnosis not present

## 2017-10-25 DIAGNOSIS — Z95811 Presence of heart assist device: Secondary | ICD-10-CM

## 2017-10-25 DIAGNOSIS — M4856XA Collapsed vertebra, not elsewhere classified, lumbar region, initial encounter for fracture: Secondary | ICD-10-CM | POA: Diagnosis not present

## 2017-10-25 DIAGNOSIS — G4730 Sleep apnea, unspecified: Secondary | ICD-10-CM | POA: Insufficient documentation

## 2017-10-25 DIAGNOSIS — I5022 Chronic systolic (congestive) heart failure: Secondary | ICD-10-CM | POA: Diagnosis not present

## 2017-10-25 DIAGNOSIS — K922 Gastrointestinal hemorrhage, unspecified: Secondary | ICD-10-CM | POA: Diagnosis not present

## 2017-10-25 DIAGNOSIS — I471 Supraventricular tachycardia: Secondary | ICD-10-CM | POA: Diagnosis not present

## 2017-10-25 DIAGNOSIS — Z7901 Long term (current) use of anticoagulants: Secondary | ICD-10-CM | POA: Diagnosis not present

## 2017-10-25 DIAGNOSIS — I5043 Acute on chronic combined systolic (congestive) and diastolic (congestive) heart failure: Secondary | ICD-10-CM

## 2017-10-25 DIAGNOSIS — R740 Nonspecific elevation of levels of transaminase and lactic acid dehydrogenase [LDH]: Secondary | ICD-10-CM | POA: Diagnosis not present

## 2017-10-25 DIAGNOSIS — I130 Hypertensive heart and chronic kidney disease with heart failure and stage 1 through stage 4 chronic kidney disease, or unspecified chronic kidney disease: Secondary | ICD-10-CM | POA: Diagnosis not present

## 2017-10-25 DIAGNOSIS — N200 Calculus of kidney: Secondary | ICD-10-CM

## 2017-10-25 DIAGNOSIS — Z85820 Personal history of malignant melanoma of skin: Secondary | ICD-10-CM | POA: Insufficient documentation

## 2017-10-25 DIAGNOSIS — I252 Old myocardial infarction: Secondary | ICD-10-CM | POA: Diagnosis not present

## 2017-10-25 DIAGNOSIS — R7402 Elevation of levels of lactic acid dehydrogenase (LDH): Secondary | ICD-10-CM

## 2017-10-25 DIAGNOSIS — I255 Ischemic cardiomyopathy: Secondary | ICD-10-CM | POA: Diagnosis not present

## 2017-10-25 DIAGNOSIS — E785 Hyperlipidemia, unspecified: Secondary | ICD-10-CM | POA: Diagnosis not present

## 2017-10-25 DIAGNOSIS — T462X5A Adverse effect of other antidysrhythmic drugs, initial encounter: Secondary | ICD-10-CM

## 2017-10-25 LAB — BASIC METABOLIC PANEL
ANION GAP: 8 (ref 5–15)
BUN: 29 mg/dL — AB (ref 6–20)
CHLORIDE: 104 mmol/L (ref 101–111)
CO2: 22 mmol/L (ref 22–32)
Calcium: 8.9 mg/dL (ref 8.9–10.3)
Creatinine, Ser: 1.51 mg/dL — ABNORMAL HIGH (ref 0.61–1.24)
GFR, EST AFRICAN AMERICAN: 49 mL/min — AB (ref >60)
GFR, EST NON AFRICAN AMERICAN: 42 mL/min — AB (ref >60)
Glucose, Bld: 97 mg/dL (ref 65–99)
POTASSIUM: 4.1 mmol/L (ref 3.5–5.1)
SODIUM: 134 mmol/L — AB (ref 135–145)

## 2017-10-25 LAB — CBC
HCT: 31.7 % — ABNORMAL LOW (ref 39.0–52.0)
Hemoglobin: 9.8 g/dL — ABNORMAL LOW (ref 13.0–17.0)
MCH: 28.7 pg (ref 26.0–34.0)
MCHC: 30.9 g/dL (ref 30.0–36.0)
MCV: 92.7 fL (ref 78.0–100.0)
PLATELETS: 209 10*3/uL (ref 150–400)
RBC: 3.42 MIL/uL — ABNORMAL LOW (ref 4.22–5.81)
RDW: 18.1 % — AB (ref 11.5–15.5)
WBC: 7.7 10*3/uL (ref 4.0–10.5)

## 2017-10-25 LAB — PROTIME-INR
INR: 1.68
PROTHROMBIN TIME: 19.7 s — AB (ref 11.4–15.2)

## 2017-10-25 LAB — LACTATE DEHYDROGENASE: LDH: 319 U/L — AB (ref 98–192)

## 2017-10-25 MED ORDER — LEVOTHYROXINE SODIUM 150 MCG PO TABS: 150.0000 ug | ORAL_TABLET | Freq: Every day | ORAL | 3 refills | Status: DC

## 2017-10-25 NOTE — Progress Notes (Signed)
Patient presents for hospital d/c follow up in [facility] today. Reports no problems with VAD equipment or concerns with drive line.  Pt reports he is having brown stools with no blood noted. He is still c/o being "weak". He also reports he had episode of orthostatic dizziness this am when getting up and getting dressed.   Vital Signs:  Doppler Pressure: 106   Automatc BP: 94/78 (81) HR: 64  SPO2: 97  %  Weight: 205  lb w/o eqt Last weight: 198  lb   VAD Indication: Destination Therapy- age excluding  VAD interrogation & Equipment Management: Speed: 8800 Flow: 3.4 Power: 4.3 w    PI: 7.5  Alarms: no clinical alarms Events: 10-20 PI events daily  Fixed speed 8800 Low speed limit: 8200  Primary Controller:  Replace back up battery in 9 months. Back up controller:   Replace back up battery in 9 months.  Annual Equipment Maintenance on UBC/PM was performed on 09/23/17.   I reviewed the LVAD parameters from today and compared the results to the patient's prior recorded data. LVAD interrogation was NEGATIVE for significant power changes, NEGATIVE for clinical alarms and STABLE for PI events/speed drops. No programming changes were made and pump is functioning within specified parameters. Pt is performing daily controller and system monitor self tests along with completing weekly and monthly maintenance for LVAD equipment.  LVAD equipment check completed and is in good working order. Back-up equipment present. Charged back up battery and performed self-test on equipment.   Exit Site Care: Drive line is being maintained weekly  by [name]. Drive line exit site well healed and incorporated. The velour is fully implanted at exit site. Dressing dry and intact. No erythema or drainage. Stabilization device present and accurately applied. Pt denies fever or chills. 4 weekly dressing kits given.  Significant Events on VAD Support:  02/2015 >>GIB (removed ASA, 2-2.5 INR) 10/2015  >>CVA(Plavix started) 02/2017> GIB (removed ASA, scopes negative) 03/2017> percutaneous lead repair  Device:Medtronic Therapies: on Last check: followed at device clinic, Dr Caryl Comes   BP & Labs:  MAP 100 - Doppler is reflecting modified systolic  Hgb 9.8 - No S/S of bleeding. Specifically denies melena/BRBPR or nosebleeds.  LDH - 319; established baseline of 250- 350. Denies tea-colored urine. No power elevations noted on interrogation.    Patient Instructions:  1. Decrease synthroid to 150 daily 2. Increase salt intake. 3. Return to [facility] in one month   Zada Girt RN [place] Coordinator   Office: [phone] 24/7 Emergency [place] Pager: [phone]

## 2017-10-25 NOTE — Patient Instructions (Signed)
1. Decrease synthroid to 150 daily 2. Increase salt intake. 3. Return to [facility] in one month

## 2017-10-25 NOTE — Progress Notes (Signed)
Patient ID: [name], male   DOB: 1941/01/11, 76 y.o.   MRN: [id number]   VAD CLINIC PROGRESS NOTE  PCP: Dr. Kandice Robinsons ([place]) GU: Dr Risa Grill  Neuro Surgeon: Dr Vertell Limber Vascular: Dr Kellie Simmering Primary HF: Dr Haroldine Laws  [name] is a 76 yo male with h/o VT, PAD and severe systolic HF (EF 42-35%) due to mixed ischemic/nonischemic CM he is s/p HM II LVAD implant for destination therapy on October 19, 2013.VAD implantation was complicated by VT, syncope, AF/Aflutter and RHF. Required short term milrinone.    GU Event 09/01/14 had impacted ureteral stone and underwent cystoscopy and flexible ureteroscopy with lithotripsy and basket extraction.   09/2015 Hematuria and chronic nephrolithiasis. Renal US showed mild hyronephrosis and bilateral renal cysts. Dr. Risa Grill took back for repeat lithotripsy and stone extraction by ureteroscopy and J stent placement. 12/18 Recurrent hematuria. 38mm left ureteral stone. 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  4/2-18 - Episode of melena. Hgb 10.9-> 8.7. EGD/colonoscopy negative 12/18 - Melena with syncope  Hgb 6.5. Transfused 5u. 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  01/2016 R CEA. He was started on Plavix.  01/2016 Lumbar fusion L3-L5 with pedicle screws posterolateral arthrodesis with BMP, allograft  Admitted 9/17 with high fevers and productive cough. CXR negative. Treated with levaquin x 7 days for acute bronchitis. Also found to have VT and underwent DC-CV. Seen by EP and amio increased  Had ramp echo in 10/17 speed turned down to 8800 due to RV failure.  Admitted 4/18 with melena and drop in Hgb. Transfused 2u. EGD/colon negative.  Admitted 5/18 with pump stops. 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  Admitted 10/13-15/18 with slow VT and ADHF.Cardioverted in ED and amio uptitrated. Also diuresed.  Saw Dr. Caryl Comes earlier this week and amio decreased.   Admitted 12/10-18/18 with syncope and recurrent GIB. Hgr 6.5. Transfused 5u. Found to have 2 AVMs and underwent APC. Also developed hematuria and CT revealed left ureteral stone (31mm). Passed with tamulosin  Follow up LVAD Clinic: He returns today for post-hospital f/u after treatment for recurrent GI bleed as above. Feels much better, More energy. No further dark stools. Urine has also cleared. No edema. Does get dizzy at times. Drinks water but feels it isn't enough. Denies orthopnea or PND. No fevers, chills or problems with driveline. No bleeding, melena or neuro symptoms. No VAD alarms. Taking all meds as prescribed.   VAD Indication: Destination Therapy- age excluding  VAD interrogation & Equipment Management: Speed: 8800 Flow: 3.4 Power: 4.3 w PI: 7.5  Alarms: no clinical alarms Events: 10-20 PI events daily  Fixed speed 8800 Low speed limit: 8200  Primary Controller: Replace back up battery in 9 months. Back up controller: Replace back up battery in 9 months.  Annual Equipment Maintenance on UBC/PM was performed on 09/23/17  Past Medical History:  Diagnosis Date  . Arthritis   . Automatic implantable cardioverter-defibrillator in situ    a. s/p Medtronic Evera device serial [id number]    . Bradycardia   . CAD (coronary artery disease)    a. s/p MI 1982;  s/p DES to CFX 2011;  s/p NSTEMI 4/12 in setting of VTach;  cath 7/12:  LM ok, LAD mid 30-40%, Dx's with 40%, mCFX stent patent, prox to mid RCA occluded with R to R and L  to R collats.  Medical Tx was continued  . Chronic systolic heart failure ([place])    a. s/p LVAD 10/2013 for destination therapy  . CKD (chronic kidney disease)   . CVD (cerebrovascular disease)    a. s/p L CEA 2008  . Cytopenia   . Dysrhythmia    AFIB  . History of GI bleed    a. on ASA- started on plavix during 10/2015 admission for CVA  . HLD (hyperlipidemia)   . HTN  (hypertension)   . Hypothyroidism   . Inappropriate therapy from implantable cardioverter-defibrillator    a. ATP for sinus tach SVT wavelet identified SVT but was no  passive (2014)  . Ischemic cardiomyopathy   . 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 L Ear   . Myocardial infarction ([place])    1992  . PAF (paroxysmal atrial fibrillation) ([place])   . PVD (peripheral vascular disease) ([place])    a. 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 Shortness of breath dyspnea    W/ EXERTION   . Sleep apnea    NO CPAP NOW  HE SENT IT BACK YRS AGO  . Stroke [facility])    a. 10/2015 admission   . Ventricular tachycardia ([place])    a. s/p AICD;   h/o ICD shock;  Amiodarone Rx. S/P ablation in 2012    Current Outpatient Medications  Medication Sig Dispense Refill  . amiodarone (PACERONE) 200 MG tablet Take 200 mg by mouth daily.    [name] atorvastatin (LIPITOR) 80 MG tablet TAKE 1 TABLET DAILY AT 6 PM (Patient taking differently: TAKE 1 TABLET (80 mg) DAILY in the morning) 30 tablet 11  . cephALEXin (KEFLEX) 500 MG capsule Take 1 capsule (500 mg total) by mouth every 8 (eight) hours. 14 capsule 0  . docusate sodium (COLACE) 100 MG capsule Take 200 mg by mouth every morning.     . metoprolol succinate (TOPROL-XL) 25 MG 24 hr tablet Take 0.5 tablets (12.5 mg total) by mouth daily. 15 tablet 6  . Multiple Vitamins-Minerals (ICAPS PO) Take 1 tablet by mouth 2 (two) times daily.    . pantoprazole (PROTONIX) 40 MG tablet Take 1 tablet (40 mg total) by mouth 2 (two) times daily. 180 tablet 6  . ranolazine (RANEXA) 500 MG 12 hr tablet Take 1 tablet (500 mg total) by mouth 2 (two) times daily. 60 tablet 6  . tamsulosin (FLOMAX) 0.4 MG CAPS capsule Take 1 capsule (0.4 mg total) by mouth daily. 30 capsule 3  . warfarin (COUMADIN) 2 MG tablet Take 2-4 mg by mouth daily. Take 2 mg (1 tablet) daily except 4 mg (2 tablets) on Tues, Thurs, Sun.     .  zinc gluconate 50 MG tablet Take 50 mg by mouth 2 (two) times daily.     . furosemide (LASIX) 20 MG tablet Take 20 mg by mouth daily as needed for fluid.    [name] levothyroxine (SYNTHROID, LEVOTHROID) 150 MCG tablet Take 1 tablet (150 mcg total) by mouth daily before breakfast. 90 tablet 3  . nitroGLYCERIN (NITROSTAT) 0.4 MG SL tablet Place 0.4 mg under the tongue every 5 (five) minutes as needed for chest pain.     No current facility-administered medications for this encounter.     Demerol [meperidine]  REVIEW OF SYSTEMS: All systems negative except as listed in HPI,  PMH and Problem list.  Vital Signs:  Doppler Pressure: 106                         Automatc BP: 94/78 (81) HR: 64 SPO2: 97  %  Weight: 205  lb w/o eqt Last weight: 198  lb    Physical Exam: General:  NAD.  HEENT: normal  Neck: supple. JVP not elevated.  Carotids 2+ bilat; no bruits. No lymphadenopathy or thryomegaly appreciated. Cor: LVAD hum.  Lungs: Clear. Abdomen: obese soft, nontender, non-distended. No hepatosplenomegaly. No bruits or masses. Good bowel sounds. Driveline site clean. Anchor in place.  Extremities: no cyanosis, clubbing, rash. Warm no edema  Neuro: alert & oriented x 3. No focal deficits. Moves all 4 without problem     ASSESSMENT AND PLAN:  1. Chronic systolic HF: s/p HM-II LVAD 10/2014.  - Much improved after recent admit/treatment for recurrent GIB - Remains NYHA II-III - Volume status still seems a bit on low side with frequent PI events. Encouraged him to increase salt intake - Take lasix only PRN  - VAD interrogated personally. Parameters stable. LVAD speed reduced to 8800 in 10/17 due to RV failure. - s/p previous driveline repair. No further pump stops 2. 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 Continue coumadin and plavix.(with h/o CVA). - Admitted 4/18 with recurrent GIB. Transfused 2u. EGD/colon negative - Admitted 12./18. hgb 6.5, Transfused 5u. EGD with 2  AVMs treated with APC - No further bleeding. Hgb improving 3.  Atrial arrhythmias: Atrial fibrillation, atrial tachycardia.  S/p DC-CV 06/04/14, DC-CV 03/02/15 and 01/19/16.  - Remians in NSR. Continue to use AliveCor for surveillance. Continue low-dose amio.  4. HTN:  - Blood pressure well controlled. Continue current regimen. 5. LVAD:  - VAD interrogated personally. Parameters stable. - LDH stable at 319 today 5. CKD, stage III:   - Stable. Creatinine stable today 1.51 6. Anticoagulation management:  - Goal INR 2-2.5 INR 1.68 today  He is off ASA and Plavix with GI bleeding.  - Uses home INR monitor.. Discussed dosing with PharmD today - LDH improved  7. Lumbar compression fracture:  s/p L3-L5 fusion 02/03/16. --back pain resolved 9. 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  Continue atorvastatin. (also warfarin INR goal 2-2.5). S/p R CEA 01/13/16. Plavix stopped 12/18 due to recurrent GIB 10. H/o amio-induced hypethyroidism:  --Followed by Dr. Forde Dandy in past.  --Amio restarted due to recurrent atrial arrhythmias and VT.  --Recent FT4 elevated. Synthroid cut to 150 daily in 12/18. Recheck 4 weeks 11. VT  - underwent DC-CV 9/17.  - repeat episode of slow VT 08/17/17. Requiring DC-CV. Amio increased for 1 week  - continue amio 200 daily - Keep K > 4.0 Mg > 2.0 - Remains in NSR on ECG and AliveCor device. Will continue to monitor 12. Recurrent renal stone - left ureteral stone in 12/18. Now passed with tamulosin - will finish Keflex - F/u Urology  Frank Quince Shanaye Rief,MD 10:09 PM

## 2017-10-28 ENCOUNTER — Ambulatory Visit (HOSPITAL_COMMUNITY): Payer: Self-pay | Admitting: *Deleted

## 2017-10-28 LAB — POCT INR: INR: 3

## 2017-11-04 ENCOUNTER — Ambulatory Visit (HOSPITAL_COMMUNITY): Payer: Self-pay | Admitting: Pharmacist

## 2017-11-04 LAB — POCT INR: INR: 2.9

## 2017-11-07 ENCOUNTER — Other Ambulatory Visit (HOSPITAL_COMMUNITY): Payer: Self-pay | Admitting: *Deleted

## 2017-11-07 ENCOUNTER — Other Ambulatory Visit: Payer: Self-pay | Admitting: Urology

## 2017-11-07 DIAGNOSIS — Z95811 Presence of heart assist device: Secondary | ICD-10-CM

## 2017-11-07 MED ORDER — OCTREOTIDE ACETATE 20 MG IM KIT: 20.0000 mg | PACK | INTRAMUSCULAR | Status: DC

## 2017-11-08 ENCOUNTER — Telehealth (HOSPITAL_COMMUNITY): Payer: Self-pay | Admitting: *Deleted

## 2017-11-08 ENCOUNTER — Other Ambulatory Visit (HOSPITAL_COMMUNITY): Payer: Self-pay | Admitting: Unknown Physician Specialty

## 2017-11-08 ENCOUNTER — Encounter (HOSPITAL_COMMUNITY): Payer: Self-pay

## 2017-11-08 ENCOUNTER — Ambulatory Visit (HOSPITAL_COMMUNITY)
Admission: RE | Admit: 2017-11-08 | Discharge: 2017-11-08 | Disposition: A | Discharge status: Home / Self Care | Payer: Medicare Other | Source: Ambulatory Visit | Attending: Internal Medicine | Admitting: Internal Medicine

## 2017-11-08 ENCOUNTER — Ambulatory Visit (HOSPITAL_COMMUNITY): Payer: Self-pay | Admitting: Pharmacist

## 2017-11-08 DIAGNOSIS — R9431 Abnormal electrocardiogram [ECG] [EKG]: Secondary | ICD-10-CM | POA: Insufficient documentation

## 2017-11-08 DIAGNOSIS — Z7901 Long term (current) use of anticoagulants: Secondary | ICD-10-CM | POA: Insufficient documentation

## 2017-11-08 DIAGNOSIS — I482 Chronic atrial fibrillation, unspecified: Secondary | ICD-10-CM

## 2017-11-08 DIAGNOSIS — Z95811 Presence of heart assist device: Secondary | ICD-10-CM | POA: Diagnosis not present

## 2017-11-08 LAB — CBC
HEMATOCRIT: 31.5 % — AB (ref 39.0–52.0)
HEMOGLOBIN: 9.5 g/dL — AB (ref 13.0–17.0)
MCH: 27.6 pg (ref 26.0–34.0)
MCHC: 30.2 g/dL (ref 30.0–36.0)
MCV: 91.6 fL (ref 78.0–100.0)
Platelets: 205 10*3/uL (ref 150–400)
RBC: 3.44 MIL/uL — AB (ref 4.22–5.81)
RDW: 17.6 % — ABNORMAL HIGH (ref 11.5–15.5)
WBC: 6.8 10*3/uL (ref 4.0–10.5)

## 2017-11-08 LAB — BASIC METABOLIC PANEL
ANION GAP: 8 (ref 5–15)
BUN: 27 mg/dL — ABNORMAL HIGH (ref 6–20)
CO2: 22 mmol/L (ref 22–32)
Calcium: 8.8 mg/dL — ABNORMAL LOW (ref 8.9–10.3)
Chloride: 103 mmol/L (ref 101–111)
Creatinine, Ser: 1.28 mg/dL — ABNORMAL HIGH (ref 0.61–1.24)
GFR, EST NON AFRICAN AMERICAN: 52 mL/min — AB (ref >60)
GLUCOSE: 108 mg/dL — AB (ref 65–99)
POTASSIUM: 4.2 mmol/L (ref 3.5–5.1)
Sodium: 133 mmol/L — ABNORMAL LOW (ref 135–145)

## 2017-11-08 LAB — PROTIME-INR
INR: 3.52
Prothrombin Time: 35 seconds — ABNORMAL HIGH (ref 11.4–15.2)

## 2017-11-08 LAB — LACTATE DEHYDROGENASE: LDH: 356 U/L — ABNORMAL HIGH (ref 98–192)

## 2017-11-08 NOTE — Progress Notes (Signed)
Patient presents for sick visit in [facility] today. Reports no problems with VAD equipment or concerns with drive line. States he has been feeling dizzy since waking this morning. EKG obtained.   Vital Signs:  Doppler Pressure 110   Automatc BP: 114/82 (94) HR:61   SPO2:99  %  Weight: 207 lb w/o eqt Last weight: 205 lb  VAD Indication: Destination Therapy- age excluding    VAD interrogation & Equipment Management: Speed:8800 Flow: 4.6 Power:4.7 w    PI:6.7  Alarms: no clinical alarms Events: rare PI event  Fixed speed 8800 Low speed limit: 8200  Exit Site Care: Drive line is being maintained weekly  by [name]. Drive line exit site well healed and incorporated. The velour is fully implanted at exit site. Dressing dry and intact. No erythema or drainage. Stabilization device present and accurately applied. Pt denies fever or chills. Pt states they have adequate dressing supplies at home.   Significant Events on VAD Support:  02/2015 >>GIB (removed ASA, 2-2.5 INR) 10/2015 >>CVA(Plavix started) 02/2017> GIB (removed ASA, scopes negative) 03/2017> percutaneous lead repair  Device:Medtronic Therapies: on Last check: today in clinic. No record of arrhythmia. Optivol elevated.  followed at device clinic, Dr Caryl Comes  BP & Labs:  MAP 110 - Doppler is reflecting MAP  Hgb 9.5 - No S/S of bleeding. Specifically denies melena/BRBPR or nosebleeds.  LDH stable at 356 with established baseline of 280- 380. Denies tea-colored urine. No power elevations noted on interrogation.   Plan: 1. Take prn lasix for 2 days 2. RTC as previously scheduled. 3. Call for further symptoms.  Balinda Quails RN [place] Coordinator   Office: [phone] 24/7 Emergency VAD Pager: [phone]

## 2017-11-08 NOTE — Telephone Encounter (Signed)
Pt called VAD pager to report onset of "weakness" today. States he felt "fine" yesterday, but when getting OOB this am was "dizzy and weak".  Reports dizziness has improved since drinking fluid, but still feels "weak".  VAD parameters: speed 8800, flow 4.1, PI 8.0, and power 4.5. Pt denies any alarms or advisories.  Pt sent Kardia EKG for review.   Dr. Haroldine Laws updated, reviewed Jodelle Red (unable to determine rhythm)  - instructed pt/wife to go to local urgent care for BMET, CBC, and EKG. Wife prefers to come here for nurse visit with labs.

## 2017-11-11 ENCOUNTER — Telehealth (HOSPITAL_COMMUNITY): Payer: Self-pay | Admitting: *Deleted

## 2017-11-11 ENCOUNTER — Ambulatory Visit (HOSPITAL_COMMUNITY): Payer: Self-pay | Admitting: Pharmacist

## 2017-11-11 LAB — POCT INR: INR: 2.2

## 2017-11-11 NOTE — Telephone Encounter (Signed)
Wife paged to report home INR 2.2 today. She also reports pt is dizzy with any movement and is "much weaker" today and is using walker in order to prevent falls. She confirms pt started meclizine and is taking 2 - 3 times daily, but "doesn't seem to be helping".  VAD parameters speed 8800, flow 4.0, PI 7.9, power 4.5w and no VAD alarms.   She also reports pt has clear urine and brown stool. Denies any evidence of bleeding. She says patient is sleeping more than usual; sleeping 10 - 12 hrs/day and this has been happening since the beginning of the year.   Will update Dr. Haroldine Laws and call her back if any further instructions.

## 2017-11-13 ENCOUNTER — Telehealth (HOSPITAL_COMMUNITY): Payer: Self-pay | Admitting: *Deleted

## 2017-11-13 ENCOUNTER — Telehealth: Payer: Self-pay | Admitting: *Deleted

## 2017-11-13 NOTE — Telephone Encounter (Signed)
Wife called to report pt is feeling much better.

## 2017-11-14 ENCOUNTER — Encounter (HOSPITAL_COMMUNITY): Payer: Self-pay | Admitting: Unknown Physician Specialty

## 2017-11-14 NOTE — Progress Notes (Signed)
Admission called in to patient placement for 11/29/17 for heparin bridge prior to kidney stone procedure.   Tanda Rockers RN, BSN VAD Coordinator 24/7 Pager [phone]

## 2017-11-14 NOTE — Telephone Encounter (Signed)
Opened in error

## 2017-11-18 ENCOUNTER — Ambulatory Visit (HOSPITAL_COMMUNITY): Payer: Self-pay | Admitting: Pharmacist

## 2017-11-18 LAB — POCT INR: INR: 2.1

## 2017-11-22 ENCOUNTER — Other Ambulatory Visit (HOSPITAL_COMMUNITY): Payer: Self-pay | Admitting: *Deleted

## 2017-11-22 ENCOUNTER — Other Ambulatory Visit (HOSPITAL_COMMUNITY): Payer: Self-pay | Admitting: Unknown Physician Specialty

## 2017-11-22 DIAGNOSIS — Z95811 Presence of heart assist device: Secondary | ICD-10-CM

## 2017-11-22 DIAGNOSIS — Z8719 Personal history of other diseases of the digestive system: Secondary | ICD-10-CM

## 2017-11-22 DIAGNOSIS — Z7901 Long term (current) use of anticoagulants: Secondary | ICD-10-CM

## 2017-11-22 MED ORDER — OCTREOTIDE ACETATE 20 MG IM KIT: 20.0000 mg | PACK | INTRAMUSCULAR | Status: DC

## 2017-11-25 ENCOUNTER — Encounter (HOSPITAL_COMMUNITY)
Admission: RE | Admit: 2017-11-25 | Discharge: 2017-11-25 | Disposition: A | Discharge status: Home / Self Care | Payer: Medicare Other | Source: Ambulatory Visit | Attending: Internal Medicine | Admitting: Internal Medicine

## 2017-11-25 ENCOUNTER — Ambulatory Visit (HOSPITAL_COMMUNITY): Payer: Self-pay | Admitting: Pharmacist

## 2017-11-25 ENCOUNTER — Other Ambulatory Visit (HOSPITAL_COMMUNITY): Payer: Medicare Other

## 2017-11-25 ENCOUNTER — Ambulatory Visit (HOSPITAL_COMMUNITY)
Admission: RE | Admit: 2017-11-25 | Discharge: 2017-11-25 | Disposition: A | Discharge status: Home / Self Care | Payer: Medicare Other | Source: Ambulatory Visit | Attending: Internal Medicine | Admitting: Internal Medicine

## 2017-11-25 ENCOUNTER — Ambulatory Visit (HOSPITAL_BASED_OUTPATIENT_CLINIC_OR_DEPARTMENT_OTHER)
Admission: RE | Admit: 2017-11-25 | Discharge: 2017-11-25 | Disposition: A | Discharge status: Home / Self Care | Payer: Medicare Other | Source: Ambulatory Visit | Attending: Cardiology | Admitting: Cardiology

## 2017-11-25 ENCOUNTER — Encounter (HOSPITAL_COMMUNITY): Payer: Self-pay

## 2017-11-25 ENCOUNTER — Other Ambulatory Visit (HOSPITAL_COMMUNITY): Payer: Self-pay | Admitting: Unknown Physician Specialty

## 2017-11-25 VITALS — BP 104/0 | HR 62 | Ht 72.0 in | Wt 204.0 lb

## 2017-11-25 DIAGNOSIS — Z7901 Long term (current) use of anticoagulants: Secondary | ICD-10-CM | POA: Diagnosis not present

## 2017-11-25 DIAGNOSIS — I509 Heart failure, unspecified: Secondary | ICD-10-CM | POA: Diagnosis present

## 2017-11-25 DIAGNOSIS — Z95811 Presence of heart assist device: Secondary | ICD-10-CM | POA: Insufficient documentation

## 2017-11-25 DIAGNOSIS — I77810 Thoracic aortic ectasia: Secondary | ICD-10-CM | POA: Diagnosis not present

## 2017-11-25 DIAGNOSIS — E079 Disorder of thyroid, unspecified: Secondary | ICD-10-CM

## 2017-11-25 DIAGNOSIS — I5022 Chronic systolic (congestive) heart failure: Secondary | ICD-10-CM | POA: Diagnosis not present

## 2017-11-25 DIAGNOSIS — I480 Paroxysmal atrial fibrillation: Secondary | ICD-10-CM | POA: Diagnosis not present

## 2017-11-25 DIAGNOSIS — I517 Cardiomegaly: Secondary | ICD-10-CM | POA: Diagnosis not present

## 2017-11-25 DIAGNOSIS — Z8719 Personal history of other diseases of the digestive system: Secondary | ICD-10-CM | POA: Insufficient documentation

## 2017-11-25 LAB — BASIC METABOLIC PANEL
ANION GAP: 10 (ref 5–15)
BUN: 35 mg/dL — ABNORMAL HIGH (ref 6–20)
CHLORIDE: 103 mmol/L (ref 101–111)
CO2: 20 mmol/L — AB (ref 22–32)
Calcium: 8.7 mg/dL — ABNORMAL LOW (ref 8.9–10.3)
Creatinine, Ser: 1.45 mg/dL — ABNORMAL HIGH (ref 0.61–1.24)
GFR calc non Af Amer: 45 mL/min — ABNORMAL LOW (ref >60)
GFR, EST AFRICAN AMERICAN: 52 mL/min — AB (ref >60)
GLUCOSE: 110 mg/dL — AB (ref 65–99)
Potassium: 4.4 mmol/L (ref 3.5–5.1)
Sodium: 133 mmol/L — ABNORMAL LOW (ref 135–145)

## 2017-11-25 LAB — CBC
HEMATOCRIT: 32.9 % — AB (ref 39.0–52.0)
HEMOGLOBIN: 10 g/dL — AB (ref 13.0–17.0)
MCH: 27.2 pg (ref 26.0–34.0)
MCHC: 30.4 g/dL (ref 30.0–36.0)
MCV: 89.6 fL (ref 78.0–100.0)
Platelets: 203 10*3/uL (ref 150–400)
RBC: 3.67 MIL/uL — ABNORMAL LOW (ref 4.22–5.81)
RDW: 17.2 % — ABNORMAL HIGH (ref 11.5–15.5)
WBC: 6.3 10*3/uL (ref 4.0–10.5)

## 2017-11-25 LAB — PROTIME-INR
INR: 1.83
Prothrombin Time: 21 seconds — ABNORMAL HIGH (ref 11.4–15.2)

## 2017-11-25 LAB — LACTATE DEHYDROGENASE: LDH: 362 U/L — ABNORMAL HIGH (ref 98–192)

## 2017-11-25 MED ORDER — OCTREOTIDE ACETATE 20 MG IM KIT
20.0000 mg | PACK | INTRAMUSCULAR | Status: DC
  Administered 2017-11-25: 20 mg via INTRAMUSCULAR
  Filled 2017-11-25 (×2): qty 1

## 2017-11-25 NOTE — Progress Notes (Signed)
  Echocardiogram 2D Echocardiogram has been performed.  Merrie Roof F 11/25/2017, 11:42 AM

## 2017-11-25 NOTE — Progress Notes (Signed)
Patient ID: [name], male   DOB: March 03, 1941, 77 y.o.   MRN: [id number]   VAD CLINIC PROGRESS NOTE  PCP: Dr. Kandice [name] ([place]) GU: Dr [name]  Neuro Surgeon: Dr [name] Vascular: Dr [name] Primary HF: Dr [name]  [name] is a 77 yo male with h/o VT, PAD and severe systolic HF (EF 30-86%) due to mixed ischemic/nonischemic CM he is s/p HM II LVAD implant for destination therapy on October 19, 2013.VAD implantation was complicated by VT, syncope, AF/Aflutter and RHF. Required short term milrinone.    GU Event 09/01/14 had impacted ureteral stone and underwent cystoscopy and flexible ureteroscopy with lithotripsy and basket extraction.   09/2015 Hematuria and chronic nephrolithiasis. Renal US showed mild hyronephrosis and bilateral renal cysts. Dr. Risa [name] took back for repeat lithotripsy and stone extraction by ureteroscopy and J stent placement. 12/18 Recurrent hematuria. 1mm left ureteral stone. 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  4/2-18 - Episode of melena. Hgb 10.9-> 8.7. EGD/colonoscopy negative 12/18 - Melena with syncope  Hgb 6.5. Transfused 5u. 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  01/2016 R CEA. He was started on Plavix.  01/2016 Lumbar fusion L3-L5 with pedicle screws posterolateral arthrodesis with BMP, allograft  Admitted 9/17 with high fevers and productive cough. CXR negative. Treated with levaquin x 7 days for acute bronchitis. Also found to have VT and underwent DC-CV. Seen by EP and amio increased  Had ramp echo in 10/17 speed turned down to 8800 due to RV failure.  Admitted 4/18 with melena and drop in Hgb. Transfused 2u. EGD/colon negative.  Admitted 5/18 with pump stops. 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  Admitted 10/13-15/18 with slow VT and ADHF.Cardioverted in ED and amio uptitrated. Also diuresed.  Saw Dr. Caryl [name] earlier this week and amio decreased.   Admitted 12/10-18/18 with syncope and recurrent GIB. Hgr 6.5. Transfused 5u. Found to have 2 AVMs and underwent APC. Also developed hematuria and CT revealed left ureteral stone (75mm). Passed with tamulosin  Follow up LVAD Clinic: He returns today for routine visit. Having good days and bad days but much more fatigued than previous. No clear cause. Not as active. Denies orthopnea or PND. No fevers, chills or problems with driveline. No bleeding, melena or neuro symptoms. No VAD alarms. Taking all meds as prescribed. Scheduled for elective admission for ureteroscopy later this week. Denies hematuria, flank pain or any urinary sx.    VAD Indication: Destination Therapy- age excluding    VAD interrogation & Equipment Management: Speed:8800 Flow: 4.0 Power:4.5 w PI: 8.2  Alarms: no clinical alarms Events: rare PI event  Fixed speed 8800 Low speed limit: 8200  Exit Site Care: Drive line is being maintained weekly by [name]. Drive line exit site well healed and incorporated. The velour is fully implanted at exit site. Dressing dry and intact. No erythema or drainage. Stabilization device present and accurately applied. Pt denies fever or chills. Pt given 6 weekly dressing kits at this visit.  62 Significant Events on VAD Support:  02/2015 >>GIB (removed ASA, 2-2.5 INR) 10/2015 >>CVA(Plavix started) 02/2017> GIB (removed ASA, scopes negative) 03/2017>percutaneous lead repair   Past Medical History:  Diagnosis Date  . Arthritis   . Automatic implantable cardioverter-defibrillator in situ    a. s/p Medtronic Evera device serial [id number]    . Bradycardia   .  CAD (coronary artery disease)    a. s/p MI 1982;  s/p DES to CFX 2011;  s/p NSTEMI 4/12 in setting of VTach;  cath 7/12:  LM ok, LAD mid 30-40%, Dx's with 40%, mCFX stent patent, prox to mid RCA occluded with R to R and L to R collats.  Medical Tx was continued  .  Chronic systolic heart failure ([name])    a. s/p LVAD 10/2013 for destination therapy  . CKD (chronic kidney disease)   . CVD (cerebrovascular disease)    a. s/p L CEA 2008  . Cytopenia   . Dysrhythmia    AFIB  . History of GI bleed    a. on ASA- started on plavix during 10/2015 admission for CVA  . HLD (hyperlipidemia)   . HTN (hypertension)   . Hypothyroidism   . Inappropriate therapy from implantable cardioverter-defibrillator    a. ATP for sinus tach SVT wavelet identified SVT but was no  passive (2014)  . Ischemic cardiomyopathy   . Melanoma of ear ([name])    a. L Ear   . Myocardial infarction ([name])    1992  . PAF (paroxysmal atrial fibrillation) ([name])   . PVD (peripheral vascular disease) ([name])    a. 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 Shortness of breath dyspnea    W/ EXERTION   . Sleep apnea    NO CPAP NOW  HE SENT IT BACK YRS AGO  . Stroke [name])    a. 10/2015 admission   . Ventricular tachycardia ([name])    a. s/p AICD;   h/o ICD shock;  Amiodarone Rx. S/P ablation in 2012    Current Outpatient Medications  Medication Sig Dispense Refill  . amiodarone (PACERONE) 200 MG tablet Take 200 mg by mouth daily.    [name] atorvastatin (LIPITOR) 80 MG tablet TAKE 1 TABLET DAILY AT 6 PM (Patient taking differently: TAKE 1 TABLET (80 mg) DAILY in the morning) 30 tablet 11  . docusate sodium (COLACE) 100 MG capsule Take 200 mg by mouth every morning.     . furosemide (LASIX) 20 MG tablet Take 20 mg by mouth daily as needed for fluid.    [name] levothyroxine (SYNTHROID, LEVOTHROID) 150 MCG tablet Take 1 tablet (150 mcg total) by mouth daily before breakfast. 90 tablet 3  . metoprolol succinate (TOPROL-XL) 25 MG 24 hr tablet Take 0.5 tablets (12.5 mg total) by mouth daily. 15 tablet 6  . Multiple Vitamins-Minerals (ICAPS PO) Take 1 tablet by mouth 2 (two) times daily.    . nitroGLYCERIN (NITROSTAT)  0.4 MG SL tablet Place 0.4 mg under the tongue every 5 (five) minutes as needed for chest pain.    . pantoprazole (PROTONIX) 40 MG tablet Take 1 tablet (40 mg total) by mouth 2 (two) times daily. 180 tablet 6  . ranolazine (RANEXA) 500 MG 12 hr tablet Take 1 tablet (500 mg total) by mouth 2 (two) times daily. 60 tablet 6  . tamsulosin (FLOMAX) 0.4 MG CAPS capsule Take 1 capsule (0.4 mg total) by mouth daily. 30 capsule 3  . warfarin (COUMADIN) 2 MG tablet Take 2-4 mg by mouth daily. Take 2 mg (1 tablet) daily except 4 mg (2 tablets) on Tues    . zinc gluconate 50 MG tablet Take 50 mg by mouth 2 (two) times daily.  No current facility-administered medications for this encounter.    Facility-Administered Medications Ordered in Other Encounters  Medication Dose Route Frequency Provider Last Rate Last Dose  . octreotide (SANDOSTATIN LAR) IM injection 20 mg  20 mg Intramuscular Q28 days Laiya Wisby, Shaune Pascal, MD   20 mg at 11/25/17 1240    Demerol [meperidine]  REVIEW OF SYSTEMS: All systems negative except as listed in HPI, PMH and Problem list.  Vital Signs:  Doppler Pressure: 104             Automatc BP: 108/74 (91) HR: 62 SPO2:97  %  Weight: 204 lb w/o eqt Last weight: 207 lb    Physical Exam: General:  NAD.  HEENT: normal  Neck: supple. JVP not elevated.  Carotids 2+ bilat; no bruits. No lymphadenopathy or thryomegaly appreciated. Cor: LVAD hum.  Lungs: Clear. Abdomen: soft, nontender, non-distended. No hepatosplenomegaly. No bruits or masses. Good bowel sounds. Driveline site clean. Anchor in place.  Extremities: no cyanosis, clubbing, rash. Warm no edema  Neuro: alert & oriented x 3. No focal deficits. Moves all 4 without problem    ASSESSMENT AND PLAN:  1. Chronic systolic HF: s/p HM-II LVAD 10/2014.  - Still struggling with fatigue. Unclear cause - NYHA III today. Volume status ok on PRN lasix - Maintaining NSR - Echo today with EF 10% and good RV unloading. AoV  closed 5/5. Septum midline. No AI. RV moderately HK (Personally reviewed)  - Volume status still seems a bit on low side with frequent PI events. Encouraged him to increase salt intake - VAD interrogated personally. Parameters stable. LVAD speed reduced to 8800 in 10/17 due to RV failure. - s/p previous driveline repair. No further pump stops - Given ongoing symptoms will proceed with RHC to assess for RV failure as cuase of symptoms  2. 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 Continue coumadin and plavix.(with h/o CVA). - Admitted 4/18 with recurrent GIB. Transfused 2u. EGD/colon negative - Admitted 12./18. hgb 6.5, Transfused 5u. EGD with 2 AVMs treated with APC - No further bleeding. Hgb improving up to 10.0 today 3.  Atrial arrhythmias: Atrial fibrillation, atrial tachycardia.  S/p DC-CV 06/04/14, DC-CV 03/02/15 and 01/19/16.  - Remains in NSR.  Continue to use AliveCor for surveillance. Continue low-dose amio.  4. HTN:  - Blood pressure well controlled. Continue current regimen. 5. LVAD:  - VAD interrogated personally. Parameters stable.  - LDH 362 5. CKD, stage III:   - Stable. Creatinine stable today1.45 6. Anticoagulation management:  - Goal INR 2-2.5 INR 1.83 today  He is off ASA and Plavix with GI bleeding.  - Uses home INR monitor. Discussed dosing with PharmD personally. 7. Lumbar compression fracture:  s/p L3-L5 fusion 02/03/16. - back pain resolved 9. 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  Continue atorvastatin. (also warfarin INR goal 2-2.5). S/p R CEA 01/13/16. Plavix stopped 12/18 due to recurrent GIB 10. H/o amio-induced hypethyroidism:  - Followed by Dr. Forde Dandy in past.  - Amio restarted due to recurrent atrial arrhythmias and VT.  - Recent FT4 elevated. Synthroid cut to 150 daily in 12/18. Recheck this week. May be contributing to fatigue.   11. VT  - underwent DC-CV 9/17.  - repeat episode of slow VT 08/17/17. Requiring DC-CV. Amio increased  for 1 week  - continue amio 200 daily - Keep K > 4.0 Mg > 2.0 - Remains in NSR on ECG and AliveCor device. Will continue to monitor 12. Recurrent  renal stone - left ureteral stone in 12/18. Now passed with tamulosin - being admitted this week for elective ureteroscopy. We will d/w Urology and see if we could repeat renal stone CT first and if normal perhaps forgoing procedure  Total time spent 45 minutes. Over half that time spent discussing above.    Oona Trammel,MD 7:10 PM

## 2017-11-25 NOTE — H&P (View-Only) (Signed)
Patient ID: [name], male   DOB: [birth year], 77 y.o.   MRN: [id number]   VAD CLINIC PROGRESS NOTE  PCP: Dr. Kandice [name] ([place]) GU: Dr [name]  Neuro Surgeon: Dr [name] Vascular: Dr [name] Primary HF: Dr [name]  [name] is a 77 yo male with h/o VT, PAD and severe systolic HF (EF 52-84%) due to mixed ischemic/nonischemic CM he is s/p HM II LVAD implant for destination therapy on October 19, 2013.VAD implantation was complicated by VT, syncope, AF/Aflutter and RHF. Required short term milrinone.    GU Event 09/01/14 had impacted ureteral stone and underwent cystoscopy and flexible ureteroscopy with lithotripsy and basket extraction.   09/2015 Hematuria and chronic nephrolithiasis. Renal US showed mild hyronephrosis and bilateral renal cysts. Dr. Risa [name] took back for repeat lithotripsy and stone extraction by ureteroscopy and J stent placement. 12/18 Recurrent hematuria. 6mm left ureteral stone. 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  4/2-18 - Episode of melena. Hgb 10.9-> 8.7. EGD/colonoscopy negative 12/18 - Melena with syncope  Hgb 6.5. Transfused 5u. 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  01/2016 R CEA. He was started on Plavix.  01/2016 Lumbar fusion L3-L5 with pedicle screws posterolateral arthrodesis with BMP, allograft  Admitted 9/17 with high fevers and productive cough. CXR negative. Treated with levaquin x 7 days for acute bronchitis. Also found to have VT and underwent DC-CV. Seen by EP and amio increased  Had ramp echo in 10/17 speed turned down to 8800 due to RV failure.  Admitted 4/18 with melena and drop in Hgb. Transfused 2u. EGD/colon negative.  Admitted 5/18 with pump stops. 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  Admitted 10/13-15/18 with slow VT and ADHF.Cardioverted in ED and amio uptitrated. Also diuresed.  Saw Dr. Caryl [name] earlier this week and amio decreased.   Admitted 12/10-18/18 with syncope and recurrent GIB. Hgr 6.5. Transfused 5u. Found to have 2 AVMs and underwent APC. Also developed hematuria and CT revealed left ureteral stone (3mm). Passed with tamulosin  Follow up LVAD Clinic: He returns today for routine visit. Having good days and bad days but much more fatigued than previous. No clear cause. Not as active. Denies orthopnea or PND. No fevers, chills or problems with driveline. No bleeding, melena or neuro symptoms. No VAD alarms. Taking all meds as prescribed. Scheduled for elective admission for ureteroscopy later this week. Denies hematuria, flank pain or any urinary sx.    VAD Indication: Destination Therapy- age excluding    VAD interrogation & Equipment Management: Speed:8800 Flow: 4.0 Power:4.5 w PI: 8.2  Alarms: no clinical alarms Events: rare PI event  Fixed speed 8800 Low speed limit: 8200  Exit Site Care: Drive line is being maintained weekly by [name]. Drive line exit site well healed and incorporated. The velour is fully implanted at exit site. Dressing dry and intact. No erythema or drainage. Stabilization device present and accurately applied. Pt denies fever or chills. Pt given 6 weekly dressing kits at this visit.  62 Significant Events on VAD Support:  02/2015 >>GIB (removed ASA, 2-2.5 INR) 10/2015 >>CVA(Plavix started) 02/2017> GIB (removed ASA, scopes negative) 03/2017>percutaneous lead repair   Past Medical History:  Diagnosis Date  . Arthritis   . Automatic implantable cardioverter-defibrillator in situ    a. s/p Medtronic Evera device serial [id number]    . Bradycardia   .  CAD (coronary artery disease)    a. s/p MI 1982;  s/p DES to CFX 2011;  s/p NSTEMI 4/12 in setting of VTach;  cath 7/12:  LM ok, LAD mid 30-40%, Dx's with 40%, mCFX stent patent, prox to mid RCA occluded with R to R and L to R collats.  Medical Tx was continued  .  Chronic systolic heart failure ([place])    a. s/p LVAD 10/2013 for destination therapy  . CKD (chronic kidney disease)   . CVD (cerebrovascular disease)    a. s/p L CEA 2008  . Cytopenia   . Dysrhythmia    AFIB  . History of GI bleed    a. on ASA- started on plavix during 10/2015 admission for CVA  . HLD (hyperlipidemia)   . HTN (hypertension)   . Hypothyroidism   . Inappropriate therapy from implantable cardioverter-defibrillator    a. ATP for sinus tach SVT wavelet identified SVT but was no  passive (2014)  . Ischemic cardiomyopathy   . Melanoma of ear ([place])    a. L Ear   . Myocardial infarction ([place])    1992  . PAF (paroxysmal atrial fibrillation) ([place])   . PVD (peripheral vascular disease) ([place])    a. 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 Shortness of breath dyspnea    W/ EXERTION   . Sleep apnea    NO CPAP NOW  HE SENT IT BACK YRS AGO  . Stroke [facility])    a. 10/2015 admission   . Ventricular tachycardia ([place])    a. s/p AICD;   h/o ICD shock;  Amiodarone Rx. S/P ablation in 2012    Current Outpatient Medications  Medication Sig Dispense Refill  . amiodarone (PACERONE) 200 MG tablet Take 200 mg by mouth daily.    [name] atorvastatin (LIPITOR) 80 MG tablet TAKE 1 TABLET DAILY AT 6 PM (Patient taking differently: TAKE 1 TABLET (80 mg) DAILY in the morning) 30 tablet 11  . docusate sodium (COLACE) 100 MG capsule Take 200 mg by mouth every morning.     . furosemide (LASIX) 20 MG tablet Take 20 mg by mouth daily as needed for fluid.    [name] levothyroxine (SYNTHROID, LEVOTHROID) 150 MCG tablet Take 1 tablet (150 mcg total) by mouth daily before breakfast. 90 tablet 3  . metoprolol succinate (TOPROL-XL) 25 MG 24 hr tablet Take 0.5 tablets (12.5 mg total) by mouth daily. 15 tablet 6  . Multiple Vitamins-Minerals (ICAPS PO) Take 1 tablet by mouth 2 (two) times daily.    . nitroGLYCERIN (NITROSTAT)  0.4 MG SL tablet Place 0.4 mg under the tongue every 5 (five) minutes as needed for chest pain.    . pantoprazole (PROTONIX) 40 MG tablet Take 1 tablet (40 mg total) by mouth 2 (two) times daily. 180 tablet 6  . ranolazine (RANEXA) 500 MG 12 hr tablet Take 1 tablet (500 mg total) by mouth 2 (two) times daily. 60 tablet 6  . tamsulosin (FLOMAX) 0.4 MG CAPS capsule Take 1 capsule (0.4 mg total) by mouth daily. 30 capsule 3  . warfarin (COUMADIN) 2 MG tablet Take 2-4 mg by mouth daily. Take 2 mg (1 tablet) daily except 4 mg (2 tablets) on Tues    . zinc gluconate 50 MG tablet Take 50 mg by mouth 2 (two) times daily.  No current facility-administered medications for this encounter.    Facility-Administered Medications Ordered in Other Encounters  Medication Dose Route Frequency Provider Last Rate Last Dose  . octreotide (SANDOSTATIN LAR) IM injection 20 mg  20 mg Intramuscular Q28 days Hallie Ertl, Shaune Pascal, MD   20 mg at 11/25/17 1240    Demerol [meperidine]  REVIEW OF SYSTEMS: All systems negative except as listed in HPI, PMH and Problem list.  Vital Signs:  Doppler Pressure: 104             Automatc BP: 108/74 (91) HR: 62 SPO2:97  %  Weight: 204 lb w/o eqt Last weight: 207 lb    Physical Exam: General:  NAD.  HEENT: normal  Neck: supple. JVP not elevated.  Carotids 2+ bilat; no bruits. No lymphadenopathy or thryomegaly appreciated. Cor: LVAD hum.  Lungs: Clear. Abdomen: soft, nontender, non-distended. No hepatosplenomegaly. No bruits or masses. Good bowel sounds. Driveline site clean. Anchor in place.  Extremities: no cyanosis, clubbing, rash. Warm no edema  Neuro: alert & oriented x 3. No focal deficits. Moves all 4 without problem    ASSESSMENT AND PLAN:  1. Chronic systolic HF: s/p HM-II LVAD 10/2014.  - Still struggling with fatigue. Unclear cause - NYHA III today. Volume status ok on PRN lasix - Maintaining NSR - Echo today with EF 10% and good RV unloading. AoV  closed 5/5. Septum midline. No AI. RV moderately HK (Personally reviewed)  - Volume status still seems a bit on low side with frequent PI events. Encouraged him to increase salt intake - VAD interrogated personally. Parameters stable. LVAD speed reduced to 8800 in 10/17 due to RV failure. - s/p previous driveline repair. No further pump stops - Given ongoing symptoms will proceed with RHC to assess for RV failure as cuase of symptoms  2. 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 Continue coumadin and plavix.(with h/o CVA). - Admitted 4/18 with recurrent GIB. Transfused 2u. EGD/colon negative - Admitted 12./18. hgb 6.5, Transfused 5u. EGD with 2 AVMs treated with APC - No further bleeding. Hgb improving up to 10.0 today 3.  Atrial arrhythmias: Atrial fibrillation, atrial tachycardia.  S/p DC-CV 06/04/14, DC-CV 03/02/15 and 01/19/16.  - Remains in NSR.  Continue to use [employer]Cor for surveillance. Continue low-dose amio.  4. HTN:  - Blood pressure well controlled. Continue current regimen. 5. LVAD:  - VAD interrogated personally. Parameters stable.  - LDH 362 5. CKD, stage III:   - Stable. Creatinine stable today1.45 6. Anticoagulation management:  - Goal INR 2-2.5 INR 1.83 today  He is off ASA and Plavix with GI bleeding.  - Uses home INR monitor. Discussed dosing with PharmD personally. 7. Lumbar compression fracture:  s/p L3-L5 fusion 02/03/16. - back pain resolved 9. 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  Continue atorvastatin. (also warfarin INR goal 2-2.5). S/p R CEA 01/13/16. Plavix stopped 12/18 due to recurrent GIB 10. H/o amio-induced hypethyroidism:  - Followed by Dr. Forde Dandy in past.  - Amio restarted due to recurrent atrial arrhythmias and VT.  - Recent FT4 elevated. Synthroid cut to 150 daily in 12/18. Recheck this week. May be contributing to fatigue.   11. VT  - underwent DC-CV 9/17.  - repeat episode of slow VT 08/17/17. Requiring DC-CV. Amio increased  for 1 week  - continue amio 200 daily - Keep K > 4.0 Mg > 2.0 - Remains in NSR on ECG and AliveCor device. Will continue to monitor 12. Recurrent  renal stone - left ureteral stone in 12/18. Now passed with tamulosin - being admitted this week for elective ureteroscopy. We will d/w Urology and see if we could repeat renal stone CT first and if normal perhaps forgoing procedure  Total time spent 45 minutes. Over half that time spent discussing above.    Frank [place],MD 7:10 PM

## 2017-11-25 NOTE — Progress Notes (Signed)
Patient presents for 2 mo fu in [facility] today. Reports no problems with VAD equipment or concerns with drive line. States he is still feeling weak at times. Pt states that he is in bed by 7pm some nights and feels tired most days.  Pt had echo today that Dr. Haroldine Laws reviewed personally at this visit.   Vital Signs:  Doppler Pressure: 104  Automatc BP: 108/74 (91) HR: 62 SPO2:97  %  Weight: 204 lb w/o eqt Last weight: 207 lb  VAD Indication: Destination Therapy- age excluding    VAD interrogation & Equipment Management: Speed:8800 Flow: 4.0 Power:4.5 w    PI: 8.2  Alarms: no clinical alarms Events: rare PI event  Fixed speed 8800 Low speed limit: 8200  Exit Site Care: Drive line is being maintained weekly  by [name]. Drive line exit site well healed and incorporated. The velour is fully implanted at exit site. Dressing dry and intact. No erythema or drainage. Stabilization device present and accurately applied. Pt denies fever or chills. Pt given 6 weekly dressing kits at this visit.  62 Significant Events on VAD Support:  02/2015 >>GIB (removed ASA, 2-2.5 INR) 10/2015 >>CVA(Plavix started) 02/2017> GIB (removed ASA, scopes negative) 03/2017> percutaneous lead repair  Device:Medtronic Therapies: on Last check: today in clinic. No record of arrhythmia. Optivol elevated.  followed at device clinic, Dr Caryl Comes  BP & Labs:  MAP 104 - Doppler is reflecting MAP  Hgb 10.0 - No S/S of bleeding. Specifically denies melena/BRBPR or nosebleeds.  LDH stable at 362 with established baseline of 280- 380. Denies tea-colored urine. No power elevations noted on interrogation.   Plan: 1. Will plan for a RHC this Friday before admission to the hospital for cystoscopy. 2. Follow up will be scheduled prior to pt being d/c from hospital.  Tanda Rockers RN [place] Coordinator   Office: [phone] 24/7 Emergency VAD Pager: [phone]

## 2017-11-25 NOTE — Discharge Instructions (Signed)
Octreotide injection solution What is this medicine? OCTREOTIDE (ok TREE oh tide) is used to reduce blood levels of growth hormone in patients with a condition called acromegaly. This medicine also reduces flushing and watery diarrhea caused by certain types of cancer. This medicine may be used for other purposes; ask your health care provider or pharmacist if you have questions. COMMON BRAND NAME(S): Sandostatin, Sandostatin LAR What should I tell my health care provider before I take this medicine? They need to know if you have any of these conditions: -gallbladder disease -kidney disease -liver disease -an unusual or allergic reaction to octreotide, other medicines, foods, dyes, or preservatives -pregnant or trying to get pregnant -breast-feeding How should I use this medicine? This medicine is for injection under the skin or into a vein (only in emergency situations). It is usually given by a health care professional in a hospital or clinic setting. If you get this medicine at home, you will be taught how to prepare and give this medicine. Allow the injection solution to come to room temperature before use. Do not warm it artificially. Use exactly as directed. Take your medicine at regular intervals. Do not take your medicine more often than directed. It is important that you put your used needles and syringes in a special sharps container. Do not put them in a trash can. If you do not have a sharps container, call your pharmacist or healthcare provider to get one. Talk to your pediatrician regarding the use of this medicine in children. Special care may be needed. Overdosage: If you think you have taken too much of this medicine contact a poison control center or emergency room at once. NOTE: This medicine is only for you. Do not share this medicine with others. What if I miss a dose? If you miss a dose, take it as soon as you can. If it is almost time for your next dose, take only that  dose. Do not take double or extra doses. What may interact with this medicine? Do not take this medicine with any of the following medications: -cisapride -droperidol -general anesthetics -grepafloxacin -perphenazine -thioridazine This medicine may also interact with the following medications: -bromocriptine -cyclosporine -diuretics -medicines for blood pressure, heart disease, irregular heart beat -medicines for diabetes, including insulin -quinidine This list may not describe all possible interactions. Give your health care provider a list of all the medicines, herbs, non-prescription drugs, or dietary supplements you use. Also tell them if you smoke, drink alcohol, or use illegal drugs. Some items may interact with your medicine. What should I watch for while using this medicine? Visit your doctor or health care professional for regular checks on your progress. To help reduce irritation at the injection site, use a different site for each injection and make sure the solution is at room temperature before use. This medicine may cause increases or decreases in blood sugar. Signs of high blood sugar include frequent urination, unusual thirst, flushed or dry skin, difficulty breathing, drowsiness, stomach ache, nausea, vomiting or dry mouth. Signs of low blood sugar include chills, cool, pale skin or cold sweats, drowsiness, extreme hunger, fast heartbeat, headache, nausea, nervousness or anxiety, shakiness, trembling, unsteadiness, tiredness, or weakness. Contact your doctor or health care professional right away if you experience any of these symptoms. What side effects may I notice from receiving this medicine? Side effects that you should report to your doctor or health care professional as soon as possible: -allergic reactions like skin rash, itching or hives, swelling   of the face, lips, or tongue -changes in blood sugar -changes in heart rate -severe stomach pain Side effects that  usually do not require medical attention (report to your doctor or health care professional if they continue or are bothersome): -diarrhea or constipation -gas or stomach pain -nausea, vomiting -pain, redness, swelling and irritation at site where injected This list may not describe all possible side effects. Call your doctor for medical advice about side effects. You may report side effects to FDA at 1-800-FDA-1088. Where should I keep my medicine? Keep out of the reach of children. Store in a refrigerator between 2 and 8 degrees C (36 and 46 degrees F). Protect from light. Allow to come to room temperature naturally. Do not use artificial heat. If protected from light, the injection may be stored at room temperature between 20 and 30 degrees C (70 and 86 degrees F) for 14 days. After the initial use, throw away any unused portion of a multiple dose vial after 14 days. Throw away unused portions of the ampules after use. NOTE: This sheet is a summary. It may not cover all possible information. If you have questions about this medicine, talk to your doctor, pharmacist, or health care provider.  2018 Elsevier/Gold Standard (2008-05-18 16:56:04)  

## 2017-11-26 ENCOUNTER — Telehealth (HOSPITAL_COMMUNITY): Payer: Self-pay | Admitting: Unknown Physician Specialty

## 2017-11-26 NOTE — Telephone Encounter (Signed)
Pt called the office stating that he has been vomiting since a little before lunch. Pt did have octreotide yesterday for the first time. Pt states the food that he ate at support group last night was "had a lot of garlic in it" he states this may have made him sick as well. Pt states that he was able to keep his meds down this morning. Pt was instructed that if he cannot keep his meds down this evening or his vomiting gets worse to please page the [facility] pager. VAD parameters are all normal Flow: 4.0; Speed: 8800; PI: 7.8; Power: 4.4. Pt verbalized understanding of all instructions.   Tanda Rockers RN, BSN VAD Coordinator 24/7 Pager [phone]

## 2017-11-29 ENCOUNTER — Other Ambulatory Visit: Payer: Self-pay

## 2017-11-29 ENCOUNTER — Encounter (HOSPITAL_COMMUNITY): Payer: Self-pay | Admitting: *Deleted

## 2017-11-29 ENCOUNTER — Inpatient Hospital Stay (HOSPITAL_COMMUNITY)
Admission: AD | Admit: 2017-11-29 | Discharge: 2017-12-17 | DRG: 659 | Disposition: A | Discharge status: Home / Self Care | Payer: Medicare Other | Attending: Internal Medicine | Admitting: Internal Medicine

## 2017-11-29 ENCOUNTER — Inpatient Hospital Stay (HOSPITAL_COMMUNITY)
Admission: AD | Disposition: A | Discharge status: Home / Self Care | Payer: Self-pay | Source: Home / Self Care | Attending: Internal Medicine

## 2017-11-29 DIAGNOSIS — Z8249 Family history of ischemic heart disease and other diseases of the circulatory system: Secondary | ICD-10-CM

## 2017-11-29 DIAGNOSIS — I4892 Unspecified atrial flutter: Secondary | ICD-10-CM | POA: Diagnosis present

## 2017-11-29 DIAGNOSIS — E871 Hypo-osmolality and hyponatremia: Secondary | ICD-10-CM | POA: Diagnosis present

## 2017-11-29 DIAGNOSIS — N183 Chronic kidney disease, stage 3 (moderate): Secondary | ICD-10-CM | POA: Diagnosis present

## 2017-11-29 DIAGNOSIS — C679 Malignant neoplasm of bladder, unspecified: Secondary | ICD-10-CM | POA: Diagnosis present

## 2017-11-29 DIAGNOSIS — Z85820 Personal history of malignant melanoma of skin: Secondary | ICD-10-CM

## 2017-11-29 DIAGNOSIS — R34 Anuria and oliguria: Secondary | ICD-10-CM | POA: Diagnosis not present

## 2017-11-29 DIAGNOSIS — I2510 Atherosclerotic heart disease of native coronary artery without angina pectoris: Secondary | ICD-10-CM | POA: Diagnosis present

## 2017-11-29 DIAGNOSIS — N200 Calculus of kidney: Secondary | ICD-10-CM | POA: Diagnosis not present

## 2017-11-29 DIAGNOSIS — D631 Anemia in chronic kidney disease: Secondary | ICD-10-CM | POA: Diagnosis present

## 2017-11-29 DIAGNOSIS — D494 Neoplasm of unspecified behavior of bladder: Secondary | ICD-10-CM | POA: Diagnosis not present

## 2017-11-29 DIAGNOSIS — Z95810 Presence of automatic (implantable) cardiac defibrillator: Secondary | ICD-10-CM

## 2017-11-29 DIAGNOSIS — G4730 Sleep apnea, unspecified: Secondary | ICD-10-CM | POA: Diagnosis present

## 2017-11-29 DIAGNOSIS — N179 Acute kidney failure, unspecified: Secondary | ICD-10-CM | POA: Diagnosis not present

## 2017-11-29 DIAGNOSIS — N289 Disorder of kidney and ureter, unspecified: Secondary | ICD-10-CM

## 2017-11-29 DIAGNOSIS — I255 Ischemic cardiomyopathy: Secondary | ICD-10-CM | POA: Diagnosis present

## 2017-11-29 DIAGNOSIS — N170 Acute kidney failure with tubular necrosis: Secondary | ICD-10-CM | POA: Diagnosis present

## 2017-11-29 DIAGNOSIS — I472 Ventricular tachycardia: Secondary | ICD-10-CM | POA: Diagnosis present

## 2017-11-29 DIAGNOSIS — Z95811 Presence of heart assist device: Secondary | ICD-10-CM

## 2017-11-29 DIAGNOSIS — E039 Hypothyroidism, unspecified: Secondary | ICD-10-CM | POA: Diagnosis present

## 2017-11-29 DIAGNOSIS — E785 Hyperlipidemia, unspecified: Secondary | ICD-10-CM | POA: Diagnosis present

## 2017-11-29 DIAGNOSIS — Z961 Presence of intraocular lens: Secondary | ICD-10-CM | POA: Diagnosis present

## 2017-11-29 DIAGNOSIS — I471 Supraventricular tachycardia: Secondary | ICD-10-CM | POA: Diagnosis present

## 2017-11-29 DIAGNOSIS — N201 Calculus of ureter: Secondary | ICD-10-CM | POA: Diagnosis present

## 2017-11-29 DIAGNOSIS — N390 Urinary tract infection, site not specified: Secondary | ICD-10-CM | POA: Diagnosis not present

## 2017-11-29 DIAGNOSIS — Z9842 Cataract extraction status, left eye: Secondary | ICD-10-CM

## 2017-11-29 DIAGNOSIS — I739 Peripheral vascular disease, unspecified: Secondary | ICD-10-CM | POA: Diagnosis present

## 2017-11-29 DIAGNOSIS — N029 Recurrent and persistent hematuria with unspecified morphologic changes: Secondary | ICD-10-CM | POA: Diagnosis present

## 2017-11-29 DIAGNOSIS — I252 Old myocardial infarction: Secondary | ICD-10-CM

## 2017-11-29 DIAGNOSIS — I130 Hypertensive heart and chronic kidney disease with heart failure and stage 1 through stage 4 chronic kidney disease, or unspecified chronic kidney disease: Secondary | ICD-10-CM | POA: Diagnosis present

## 2017-11-29 DIAGNOSIS — Z8679 Personal history of other diseases of the circulatory system: Secondary | ICD-10-CM

## 2017-11-29 DIAGNOSIS — I5023 Acute on chronic systolic (congestive) heart failure: Secondary | ICD-10-CM | POA: Diagnosis present

## 2017-11-29 DIAGNOSIS — N136 Pyonephrosis: Secondary | ICD-10-CM | POA: Diagnosis present

## 2017-11-29 DIAGNOSIS — Z01818 Encounter for other preprocedural examination: Secondary | ICD-10-CM

## 2017-11-29 DIAGNOSIS — I480 Paroxysmal atrial fibrillation: Secondary | ICD-10-CM | POA: Diagnosis present

## 2017-11-29 DIAGNOSIS — N3289 Other specified disorders of bladder: Secondary | ICD-10-CM | POA: Diagnosis present

## 2017-11-29 DIAGNOSIS — I5082 Biventricular heart failure: Secondary | ICD-10-CM | POA: Diagnosis present

## 2017-11-29 DIAGNOSIS — N189 Chronic kidney disease, unspecified: Secondary | ICD-10-CM

## 2017-11-29 DIAGNOSIS — Z539 Procedure and treatment not carried out, unspecified reason: Secondary | ICD-10-CM

## 2017-11-29 DIAGNOSIS — C670 Malignant neoplasm of trigone of bladder: Secondary | ICD-10-CM | POA: Diagnosis not present

## 2017-11-29 DIAGNOSIS — Z419 Encounter for procedure for purposes other than remedying health state, unspecified: Secondary | ICD-10-CM

## 2017-11-29 DIAGNOSIS — D649 Anemia, unspecified: Secondary | ICD-10-CM | POA: Diagnosis not present

## 2017-11-29 DIAGNOSIS — Z87891 Personal history of nicotine dependence: Secondary | ICD-10-CM

## 2017-11-29 DIAGNOSIS — Z8673 Personal history of transient ischemic attack (TIA), and cerebral infarction without residual deficits: Secondary | ICD-10-CM

## 2017-11-29 DIAGNOSIS — Z8349 Family history of other endocrine, nutritional and metabolic diseases: Secondary | ICD-10-CM

## 2017-11-29 DIAGNOSIS — Z9841 Cataract extraction status, right eye: Secondary | ICD-10-CM

## 2017-11-29 DIAGNOSIS — R310 Gross hematuria: Secondary | ICD-10-CM | POA: Diagnosis present

## 2017-11-29 DIAGNOSIS — Z823 Family history of stroke: Secondary | ICD-10-CM

## 2017-11-29 DIAGNOSIS — Z79899 Other long term (current) drug therapy: Secondary | ICD-10-CM

## 2017-11-29 DIAGNOSIS — D62 Acute posthemorrhagic anemia: Secondary | ICD-10-CM | POA: Diagnosis not present

## 2017-11-29 DIAGNOSIS — Z955 Presence of coronary angioplasty implant and graft: Secondary | ICD-10-CM

## 2017-11-29 DIAGNOSIS — Z7901 Long term (current) use of anticoagulants: Secondary | ICD-10-CM

## 2017-11-29 DIAGNOSIS — R319 Hematuria, unspecified: Secondary | ICD-10-CM | POA: Diagnosis not present

## 2017-11-29 DIAGNOSIS — M1990 Unspecified osteoarthritis, unspecified site: Secondary | ICD-10-CM | POA: Diagnosis present

## 2017-11-29 DIAGNOSIS — Z79890 Hormone replacement therapy (postmenopausal): Secondary | ICD-10-CM

## 2017-11-29 DIAGNOSIS — I9589 Other hypotension: Secondary | ICD-10-CM | POA: Diagnosis not present

## 2017-11-29 HISTORY — PX: RIGHT HEART CATH: CATH118263

## 2017-11-29 LAB — POCT I-STAT 3, VENOUS BLOOD GAS (G3P V)
ACID-BASE DEFICIT: 3 mmol/L — AB (ref 0.0–2.0)
ACID-BASE DEFICIT: 3 mmol/L — AB (ref 0.0–2.0)
ACID-BASE DEFICIT: 4 mmol/L — AB (ref 0.0–2.0)
Acid-base deficit: 3 mmol/L — ABNORMAL HIGH (ref 0.0–2.0)
Acid-base deficit: 4 mmol/L — ABNORMAL HIGH (ref 0.0–2.0)
Acid-base deficit: 4 mmol/L — ABNORMAL HIGH (ref 0.0–2.0)
Acid-base deficit: 2 mmol/L (ref 0.0–2.0)
Acid-base deficit: 3 mmol/L — ABNORMAL HIGH (ref 0.0–2.0)
BICARBONATE: 22.4 mmol/L (ref 20.0–28.0)
Bicarbonate: 20.8 mmol/L (ref 20.0–28.0)
Bicarbonate: 20.8 mmol/L (ref 20.0–28.0)
Bicarbonate: 21 mmol/L (ref 20.0–28.0)
Bicarbonate: 21.3 mmol/L (ref 20.0–28.0)
Bicarbonate: 21.3 mmol/L (ref 20.0–28.0)
Bicarbonate: 21.7 mmol/L (ref 20.0–28.0)
Bicarbonate: 22.8 mmol/L (ref 20.0–28.0)
O2 SAT: 39 %
O2 SAT: 40 %
O2 SAT: 42 %
O2 Saturation: 38 %
O2 Saturation: 39 %
O2 Saturation: 43 %
O2 Saturation: 41 %
O2 Saturation: 42 %
PCO2 VEN: 35.2 mmHg — AB (ref 44.0–60.0)
PCO2 VEN: 35.8 mmHg — AB (ref 44.0–60.0)
PCO2 VEN: 36.3 mmHg — AB (ref 44.0–60.0)
PCO2 VEN: 36.4 mmHg — AB (ref 44.0–60.0)
PCO2 VEN: 37.2 mmHg — AB (ref 44.0–60.0)
PCO2 VEN: 38.1 mmHg — AB (ref 44.0–60.0)
PH VEN: 7.371 (ref 7.250–7.430)
PH VEN: 7.376 (ref 7.250–7.430)
PH VEN: 7.38 (ref 7.250–7.430)
PH VEN: 7.389 (ref 7.250–7.430)
PH VEN: 7.373 (ref 7.250–7.430)
PO2 VEN: 23 mmHg — AB (ref 32.0–45.0)
PO2 VEN: 23 mmHg — AB (ref 32.0–45.0)
PO2 VEN: 24 mmHg — AB (ref 32.0–45.0)
TCO2: 22 mmol/L (ref 22–32)
TCO2: 22 mmol/L (ref 22–32)
TCO2: 22 mmol/L (ref 22–32)
TCO2: 22 mmol/L (ref 22–32)
TCO2: 22 mmol/L (ref 22–32)
TCO2: 24 mmol/L (ref 22–32)
TCO2: 23 mmol/L (ref 22–32)
TCO2: 24 mmol/L (ref 22–32)
pCO2, Ven: 34.7 mmHg — ABNORMAL LOW (ref 44.0–60.0)
pCO2, Ven: 38.7 mmHg — ABNORMAL LOW (ref 44.0–60.0)
pH, Ven: 7.373 (ref 7.250–7.430)
pH, Ven: 7.377 (ref 7.250–7.430)
pH, Ven: 7.384 (ref 7.250–7.430)
pO2, Ven: 23 mmHg — CL (ref 32.0–45.0)
pO2, Ven: 23 mmHg — CL (ref 32.0–45.0)
pO2, Ven: 24 mmHg — CL (ref 32.0–45.0)
pO2, Ven: 24 mmHg — CL (ref 32.0–45.0)
pO2, Ven: 24 mmHg — CL (ref 32.0–45.0)

## 2017-11-29 LAB — MRSA PCR SCREENING: MRSA BY PCR: NEGATIVE

## 2017-11-29 LAB — PROTIME-INR
INR: 2.2
INR: 2.23
Prothrombin Time: 24.2 seconds — ABNORMAL HIGH (ref 11.4–15.2)
Prothrombin Time: 24.5 seconds — ABNORMAL HIGH (ref 11.4–15.2)

## 2017-11-29 SURGERY — RIGHT HEART CATH
Anesthesia: LOCAL

## 2017-11-29 MED ORDER — ASPIRIN 81 MG PO CHEW
CHEWABLE_TABLET | ORAL | Status: AC
  Filled 2017-11-29: qty 1

## 2017-11-29 MED ORDER — ACETAMINOPHEN 325 MG PO TABS
650.0000 mg | ORAL_TABLET | ORAL | Status: DC | PRN
  Filled 2017-11-29: qty 2

## 2017-11-29 MED ORDER — ASPIRIN 81 MG PO CHEW
81.0000 mg | CHEWABLE_TABLET | ORAL | Status: AC
  Administered 2017-11-29: 81 mg via ORAL

## 2017-11-29 MED ORDER — MILRINONE LACTATE IN DEXTROSE 20-5 MG/100ML-% IV SOLN
0.1000 ug/kg/min | INTRAVENOUS | Status: DC
  Administered 2017-11-29 – 2017-12-01 (×2): 0.125 ug/kg/min via INTRAVENOUS
  Administered 2017-12-03 – 2017-12-05 (×3): 0.2 ug/kg/min via INTRAVENOUS
  Administered 2017-12-06 – 2017-12-08 (×2): 0.1 ug/kg/min via INTRAVENOUS
  Filled 2017-11-29 (×10): qty 100

## 2017-11-29 MED ORDER — SODIUM CHLORIDE 0.9 % IV SOLN
250.0000 mL | INTRAVENOUS | Status: DC | PRN
  Administered 2017-12-04: 13:00:00 via INTRAVENOUS

## 2017-11-29 MED ORDER — SODIUM CHLORIDE 0.9% FLUSH: 3.0000 mL | INTRAVENOUS | Status: DC | PRN

## 2017-11-29 MED ORDER — ONDANSETRON HCL 4 MG/2ML IJ SOLN
4.0000 mg | Freq: Four times a day (QID) | INTRAMUSCULAR | Status: DC | PRN
  Filled 2017-11-29 (×3): qty 2

## 2017-11-29 MED ORDER — SODIUM CHLORIDE 0.9 % IV SOLN
INTRAVENOUS | Status: DC
  Administered 2017-11-29: 06:00:00 via INTRAVENOUS

## 2017-11-29 MED ORDER — SILDENAFIL CITRATE 20 MG PO TABS
20.0000 mg | ORAL_TABLET | Freq: Three times a day (TID) | ORAL | Status: DC
  Administered 2017-11-29 – 2017-12-17 (×54): 20 mg via ORAL
  Filled 2017-11-29 (×54): qty 1

## 2017-11-29 MED ORDER — MILRINONE LACTATE IN DEXTROSE 20-5 MG/100ML-% IV SOLN
0.2500 ug/kg/min | INTRAVENOUS | Status: DC
  Filled 2017-11-29: qty 100

## 2017-11-29 MED ORDER — HEPARIN (PORCINE) IN NACL 2-0.9 UNIT/ML-% IJ SOLN
INTRAMUSCULAR | Status: AC
  Filled 2017-11-29: qty 500

## 2017-11-29 MED ORDER — SODIUM CHLORIDE 0.9% FLUSH
3.0000 mL | Freq: Two times a day (BID) | INTRAVENOUS | Status: DC
  Administered 2017-11-29 – 2017-12-17 (×27): 3 mL via INTRAVENOUS

## 2017-11-29 MED ORDER — ACETAMINOPHEN 325 MG PO TABS
650.0000 mg | ORAL_TABLET | ORAL | Status: DC | PRN
  Administered 2017-12-09: 650 mg via ORAL

## 2017-11-29 MED ORDER — LIDOCAINE HCL 1 % IJ SOLN
INTRAMUSCULAR | Status: AC
  Filled 2017-11-29: qty 20

## 2017-11-29 MED ORDER — SODIUM CHLORIDE 0.9% FLUSH: 3.0000 mL | Freq: Two times a day (BID) | INTRAVENOUS | Status: DC

## 2017-11-29 MED ORDER — MILRINONE LACTATE IN DEXTROSE 20-5 MG/100ML-% IV SOLN
INTRAVENOUS | Status: AC | PRN
  Administered 2017-11-29: 0.125 ug/kg/min via INTRAVENOUS

## 2017-11-29 MED ORDER — LIDOCAINE HCL (PF) 1 % IJ SOLN
INTRAMUSCULAR | Status: DC | PRN
  Administered 2017-11-29: 2 mL via SUBCUTANEOUS

## 2017-11-29 MED ORDER — HEPARIN (PORCINE) IN NACL 2-0.9 UNIT/ML-% IJ SOLN
INTRAMUSCULAR | Status: AC | PRN
  Administered 2017-11-29: 500 mL

## 2017-11-29 MED ORDER — SODIUM CHLORIDE 0.9 % IV SOLN: 250.0000 mL | INTRAVENOUS | Status: DC | PRN

## 2017-11-29 MED ORDER — ONDANSETRON HCL 4 MG/2ML IJ SOLN
4.0000 mg | Freq: Four times a day (QID) | INTRAMUSCULAR | Status: DC | PRN
Start: 2017-11-29 — End: 2017-12-17
  Administered 2017-12-07 – 2017-12-09 (×3): 4 mg via INTRAVENOUS

## 2017-11-29 SURGICAL SUPPLY — 8 items
CATH BALLN WEDGE 5F 110CM (CATHETERS) ×2 IMPLANT
GUIDEWIRE .025 260CM (WIRE) ×2 IMPLANT
PACK CARDIAC CATHETERIZATION (CUSTOM PROCEDURE TRAY) ×2 IMPLANT
PROTECTION STATION PRESSURIZED (MISCELLANEOUS) ×2
SHEATH GLIDE SLENDER 4/5FR (SHEATH) ×2 IMPLANT
STATION PROTECTION PRESSURIZED (MISCELLANEOUS) ×1 IMPLANT
TRANSDUCER W/STOPCOCK (MISCELLANEOUS) ×2 IMPLANT
TUBING ART PRESS 72  MALE/MALE (TUBING) ×2 IMPLANT

## 2017-11-29 NOTE — H&P (Signed)
Patient ID: [name], male   DOB: 11/18/1940, 77 y.o.   MRN: [id number]   VAD CLINIC PROGRESS NOTE  PCP: Dr. Kandice Robinsons ([place]) GU: Dr Risa Grill  Neuro Surgeon: Dr Vertell Limber Vascular: Dr Kellie Simmering Primary HF: Dr Haroldine Laws  [name] is a 77 yo male with h/o VT, PAD and severe systolic HF (EF 99-83%) due to mixed ischemic/nonischemic CM he is s/p HM II LVAD implant for destination therapy on October 19, 2013.VAD implantation was complicated by VT, syncope, AF/Aflutter and RHF. Required short term milrinone.    GU Event 09/01/14 had impacted ureteral stone and underwent cystoscopy and flexible ureteroscopy with lithotripsy and basket extraction.   09/2015 Hematuria and chronic nephrolithiasis. Renal US showed mild hyronephrosis and bilateral renal cysts. Dr. Risa Grill took back for repeat lithotripsy and stone extraction by ureteroscopy and J stent placement. 12/18 Recurrent hematuria. 10mm left ureteral stone. 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  4/2-18 - Episode of melena. Hgb 10.9-> 8.7. EGD/colonoscopy negative 12/18 - Melena with syncope  Hgb 6.5. Transfused 5u. 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  01/2016 R CEA. He was started on Plavix.  01/2016 Lumbar fusion L3-L5 with pedicle screws posterolateral arthrodesis with BMP, allograft  Admitted 9/17 with high fevers and productive cough. CXR negative. Treated with levaquin x 7 days for acute bronchitis. Also found to have VT and underwent DC-CV. Seen by EP and amio increased  Had ramp echo in 10/17 speed turned down to 8800 due to RV failure.  Admitted 4/18 with melena and drop in Hgb. Transfused 2u. EGD/colon negative.  Admitted 5/18 with pump stops. 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  Admitted 10/13-15/18 with slow VT and ADHF.Cardioverted in ED and amio uptitrated. Also diuresed.  Saw Dr. Caryl Comes earlier this week and amio decreased.   Admitted 12/10-18/18 with syncope and recurrent GIB. Hgr 6.5. Transfused 5u. Found to have 2 AVMs and underwent APC. Also developed hematuria and CT revealed left ureteral stone (27mm). Passed with tamulosin  Being admitted today for elective ureteroscopy due to R ureteral stone.  Stil with occasional hematuria. Continues with fatigue. RHC today shows low output due to RV failure. Milrinone initiated.  Denies orthopnea or PND. No fevers, chills or problems with driveline. No bleeding, melena or neuro symptoms. No VAD alarms. Taking all meds as prescribed.    On 8800  RA = 12  RV = 48/15 PA = 46/18 (29) PCW = 18 v = 30 Fick cardiac output/index = 4.2/2.0 PVR = 2.6 Ao sat = 91% PA sat =43%, 42  On 9000  PA = 48/25 (32) PCW = 16 (v= 25) Fick = 3.9/1.8 Ao = 92% PA = 39%, 38%  On 8600  PA = 51/24 (36) PCW = 26 (v=38) Fick 3.9/1.8 Ao = 91% PA = 38%, 39%  On 8800 + milrinone 0.125 mcg/kg/min  PA = 49/20 (31) PCW = 23 (v= 35) Fick 4.2/2.0 Ao = 91% PA = 41%, 41%  Assessment:  1. Biventricular failure despite VAD support  Plan/Discussion:  Will try course of milrinone to support RV. Add sildenafil as well.    Past Medical History:  Diagnosis Date  . Arthritis   . Automatic implantable cardioverter-defibrillator in situ    a. s/p Medtronic Evera device serial [id number]    . Bradycardia   . CAD (  coronary artery disease)    a. s/p MI 1982;  s/p DES to CFX 2011;  s/p NSTEMI 4/12 in setting of VTach;  cath 7/12:  LM ok, LAD mid 30-40%, Dx's with 40%, mCFX stent patent, prox to mid RCA occluded with R to R and L to R collats.  Medical Tx was continued  . Chronic systolic heart failure ([place])    a. s/p LVAD 10/2013 for destination therapy  . CKD (chronic kidney disease)   . CVD (cerebrovascular disease)    a. s/p L CEA 2008  . Cytopenia   . Dysrhythmia    AFIB  . History of GI bleed    a. on ASA-  started on plavix during 10/2015 admission for CVA  . HLD (hyperlipidemia)   . HTN (hypertension)   . Hypothyroidism   . Inappropriate therapy from implantable cardioverter-defibrillator    a. ATP for sinus tach SVT wavelet identified SVT but was no  passive (2014)  . Ischemic cardiomyopathy   . 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 L Ear   . Myocardial infarction ([place])    1992  . PAF (paroxysmal atrial fibrillation) ([place])   . PVD (peripheral vascular disease) ([place])    a. 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 Shortness of breath dyspnea    W/ EXERTION   . Sleep apnea    NO CPAP NOW  HE SENT IT BACK YRS AGO  . Stroke [facility])    a. 10/2015 admission   . Ventricular tachycardia ([place])    a. s/p AICD;   h/o ICD shock;  Amiodarone Rx. S/P ablation in 2012    Current Facility-Administered Medications  Medication Dose Route Frequency Provider Last Rate Last Dose  . 0.9 %  sodium chloride infusion  250 mL Intravenous PRN Bensimhon, Shaune Pascal, MD      . 0.9 %  sodium chloride infusion   Intravenous Continuous Bensimhon, Shaune Pascal, MD 10 mL/hr at 11/29/17 [phone]    . aspirin 81 MG chewable tablet           . milrinone (PRIMACOR) 20 MG/100 ML (0.2 mg/mL) infusion  0.25 mcg/kg/min Intravenous Continuous Bensimhon, Shaune Pascal, MD      . milrinone (PRIMACOR) 20 MG/100 ML (0.2 mg/mL) infusion  0.125 mcg/kg/min Intravenous Continuous Bensimhon, Shaune Pascal, MD      . ondansetron (ZOFRAN) injection 4 mg  4 mg Intravenous Q6H PRN Bensimhon, Shaune Pascal, MD      . sildenafil (REVATIO) tablet 20 mg  20 mg Oral TID Bensimhon, Shaune Pascal, MD      . sodium chloride flush (NS) 0.9 % injection 3 mL  3 mL Intravenous Q12H Bensimhon, Shaune Pascal, MD      . sodium chloride flush (NS) 0.9 % injection 3 mL  3 mL Intravenous PRN Bensimhon, Shaune Pascal, MD        Demerol [meperidine]  REVIEW OF SYSTEMS: All systems negative except as listed in  HPI, PMH and Problem list.  Vital Signs:  Doppler Pressure: 104             Automatc BP: 108/74 (91) HR: 62 SPO2:97  %  Weight: 204 lb w/o eqt Last weight: 207 lb    Physical Exam: General:  NAD.  HEENT: normal  Neck: supple. JVP not elevated.  Carotids 2+ bilat; no bruits.  No lymphadenopathy or thryomegaly appreciated. Cor: LVAD hum.  Lungs: Clear. Abdomen: obese soft, nontender, non-distended. No hepatosplenomegaly. No bruits or masses. Good bowel sounds. Driveline site clean. Anchor in place.  Extremities: no cyanosis, clubbing, rash. Warm no edema  Neuro: alert & oriented x 3. No focal deficits. Moves all 4 without problem    ASSESSMENT AND PLAN:  1. 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  - Struggling with progressive NYHA III symptoms - RHC today shows priamrily RHF with low output.  - Will admit for course of milrinone and see if he improves. - Once ureteroscopy performed will place tunneled PICC for home milrinone if he is improving on it - Add sildenafil  - Continue VAD speed at 8800. VAD interrogated personally. Parameters stable.  2. Recurrent renal stone - left ureteral stone in 12/18. Now passed with tamulosin - being admitted  for elective ureteroscopy. Hold coumadin. Start heparin when INR < 2.0. Discussed dosing with PharmD personally.  3. 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 Continue coumadin and plavix.(with h/o CVA). - Admitted 4/18 with recurrent GIB. Transfused 2u. EGD/colon negative - Admitted 12./18. hgb 6.5, Transfused 5u. EGD with 2 AVMs treated with APC - No further bleeding. Check labs today  4.  Atrial arrhythmias: Atrial fibrillation, atrial tachycardia.  S/p DC-CV 06/04/14, DC-CV 03/02/15 and 01/19/16.  - Remains in NSR.  Continue to use AliveCor for surveillance. Continue low-dose amio. \  5. HTN:  - Blood pressure well controlled. Continue current regimen.  6. LVAD:  -VAD interrogated  personally. Parameters stable. - LDH pending   5. CKD, stage III:   - Stable. Recheck today  6. 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  Continue atorvastatin. (also warfarin INR goal 2-2.5). S/p R CEA 01/13/16. Plavix stopped 12/18 due to recurrent GIB  7. H/o amio-induced hypethyroidism:  - Followed by Dr. Forde Dandy in past.  - Amio restarted due to recurrent atrial arrhythmias and VT.  - Recent FT4 elevated. Synthroid cut to 150 daily in 12/18. Recheck today. May be contributing to fatigue.    8. VT  - underwent DC-CV 9/17.  - repeat episode of slow VT 08/17/17. Requiring DC-CV. Amio increased for 1 week  - continue amio 200 daily - Keep K > 4.0 Mg > 2.0 - Remains in NSR on ECG and AliveCor device. Will continue to monitor    Quillian Quince Bensimhon,MD 9:47 AM

## 2017-11-29 NOTE — Progress Notes (Signed)
Admitted from the cath lab awake and alert.

## 2017-11-29 NOTE — Progress Notes (Addendum)
ANTICOAGULATION CONSULT NOTE - Initial Consult  Pharmacy Consult for Heparin when INR < 2. Indication: LVAD  Allergies  Allergen Reactions  . Demerol [Meperidine] Other (See Comments)    Paralysis. Could only move eyes.     Patient Measurements: Height: 6' (182.9 cm) Weight: 194 lb (88 kg) IBW/kg (Calculated) : 77.6 Heparin Dosing Weight: n/a  Vital Signs: Temp: 97.8 F (36.6 C) (01/25 0536) Temp Source: Oral (01/25 0536) BP: 75/31 (01/25 1245) Pulse Rate: 34 (01/25 1245)  Labs: Recent Labs    11/29/17 0547  LABPROT 24.2*  INR 2.20    Estimated Creatinine Clearance: 46.1 mL/min (A) (by C-G formula based on SCr of 1.45 mg/dL (H)).   Medical History: Past Medical History:  Diagnosis Date  . Arthritis   . Automatic implantable cardioverter-defibrillator in situ    a. s/p Medtronic Evera device serial [id number]    . Bradycardia   . CAD (coronary artery disease)    a. s/p MI 1982;  s/p DES to CFX 2011;  s/p NSTEMI 4/12 in setting of VTach;  cath 7/12:  LM ok, LAD mid 30-40%, Dx's with 40%, mCFX stent patent, prox to mid RCA occluded with R to R and L to R collats.  Medical Tx was continued  . Chronic systolic heart failure ([place])    a. s/p LVAD 10/2013 for destination therapy  . CKD (chronic kidney disease)   . CVD (cerebrovascular disease)    a. s/p L CEA 2008  . Cytopenia   . Dysrhythmia    AFIB  . History of GI bleed    a. on ASA- started on plavix during 10/2015 admission for CVA  . HLD (hyperlipidemia)   . HTN (hypertension)   . Hypothyroidism   . Inappropriate therapy from implantable cardioverter-defibrillator    a. ATP for sinus tach SVT wavelet identified SVT but was no  passive (2014)  . Ischemic cardiomyopathy   . 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 L Ear   . Myocardial infarction ([place])    1992  . PAF (paroxysmal atrial fibrillation) ([place])   . PVD (peripheral vascular disease) ([place])    a. 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 Shortness of breath dyspnea    W/ EXERTION   . Sleep apnea    NO CPAP NOW  HE SENT IT BACK YRS AGO  . Stroke [facility])    a. 10/2015 admission   . Ventricular tachycardia ([place])    a. s/p AICD;   h/o ICD shock;  Amiodarone Rx. S/P ablation in 2012    Medications:  Scheduled:  . aspirin      . sildenafil  20 mg Oral TID  . sodium chloride flush  3 mL Intravenous Q12H    Assessment: 77 yo male with LVAD, admitted for RHC and possible ureteroscopy.  Pharmacy asked to hold Coumadin and start heparin when INR < 2.  Last dose of Coumadin last night 1/24.  Goal of Therapy:  Heparin level 0.3-0.7 INR 2-2.5 Monitor platelets by anticoagulation protocol: Yes   Plan:  1. Hold Coumadin. 2. Recheck INR with AM labs, start heparin when < 2. 3. F/u plans to resume Coumadin after procedure.  Uvaldo Rising, BCPS  Clinical Pharmacist Pager [phone]  11/29/2017 5:39 PM

## 2017-11-29 NOTE — Progress Notes (Addendum)
Site area: Right brachial a 5 french venous sheath was removed  Site Prior to Removal:  Level 0  Pressure Applied For 15 MINUTES    Bedrest Beginning at 0910am  Manual:   Yes.    Patient Status During Pull:  stable  Post Pull Groin Site:  Level 0  Post Pull Instructions Given:  Yes.    Post Pull Pulses Present:  Yes.    Dressing Applied:  Yes.    Comments:

## 2017-11-29 NOTE — Progress Notes (Signed)
VAD Coordinator Procedure Note:   VAD Coordinator paged to cath lab for pt's [place]. Cart taken, pt placed on power module/system monitor. Dr. Haroldine Laws performed [place].    Auto cuff(MAP):   Flow:  PI: Power:      Speed:              Pre-procedure: 7:45 am 125/90 (102)  3.8  8.3 4.4  8800 7:50  122/91 (104)  3.9  8.0 4.4   Speed increased to 9000:   121/93 (103)  4.0  8.3 4.7  9000  Speed decreased to 8600:   121/85 (97)  4.0  7.3 4.3  8600  Speed increased to 8800:   120/87 (98)  4.0  7.9 4.5  8800    Milrinone 0.125 started    Patient tolerated the procedure well.   Patient Disposition: Cath lab holding room with wife at bedside. Awaiting room for admission.

## 2017-11-29 NOTE — Interval H&P Note (Signed)
History and Physical Interval Note:  11/29/2017 7:41 AM  [name]  has presented today for surgery, with the diagnosis of vad patient  The various methods of treatment have been discussed with the patient and family. After consideration of risks, benefits and other options for treatment, the patient has consented to  Procedure(s): RIGHT HEART CATH (N/A) as a surgical intervention .  The patient's history has been reviewed, patient examined, no change in status, stable for surgery.  I have reviewed the patient's chart and labs.  Questions were answered to the patient's satisfaction.     Daniel Bensimhon

## 2017-11-30 ENCOUNTER — Encounter (HOSPITAL_COMMUNITY): Payer: Self-pay

## 2017-11-30 ENCOUNTER — Other Ambulatory Visit: Payer: Self-pay

## 2017-11-30 LAB — BASIC METABOLIC PANEL
ANION GAP: 10 (ref 5–15)
BUN: 23 mg/dL — ABNORMAL HIGH (ref 6–20)
CALCIUM: 8.5 mg/dL — AB (ref 8.9–10.3)
CO2: 19 mmol/L — ABNORMAL LOW (ref 22–32)
Chloride: 109 mmol/L (ref 101–111)
Creatinine, Ser: 1.33 mg/dL — ABNORMAL HIGH (ref 0.61–1.24)
GFR, EST AFRICAN AMERICAN: 57 mL/min — AB (ref >60)
GFR, EST NON AFRICAN AMERICAN: 50 mL/min — AB (ref >60)
GLUCOSE: 113 mg/dL — AB (ref 65–99)
Potassium: 4.6 mmol/L (ref 3.5–5.1)
Sodium: 138 mmol/L (ref 135–145)

## 2017-11-30 LAB — CBC
HCT: 29.6 % — ABNORMAL LOW (ref 39.0–52.0)
Hemoglobin: 9.2 g/dL — ABNORMAL LOW (ref 13.0–17.0)
MCH: 28 pg (ref 26.0–34.0)
MCHC: 31.1 g/dL (ref 30.0–36.0)
MCV: 90 fL (ref 78.0–100.0)
Platelets: 180 10*3/uL (ref 150–400)
RBC: 3.29 MIL/uL — ABNORMAL LOW (ref 4.22–5.81)
RDW: 17.6 % — ABNORMAL HIGH (ref 11.5–15.5)
WBC: 5.7 10*3/uL (ref 4.0–10.5)

## 2017-11-30 LAB — LACTATE DEHYDROGENASE: LDH: 332 U/L — ABNORMAL HIGH (ref 98–192)

## 2017-11-30 LAB — PROTIME-INR
INR: 2.12
INR: 1.89
Prothrombin Time: 23.5 seconds — ABNORMAL HIGH (ref 11.4–15.2)
Prothrombin Time: 21.6 seconds — ABNORMAL HIGH (ref 11.4–15.2)

## 2017-11-30 MED ORDER — HEPARIN (PORCINE) IN NACL 100-0.45 UNIT/ML-% IJ SOLN
1450.0000 [IU]/h | INTRAMUSCULAR | Status: DC
  Administered 2017-11-30: 1050 [IU]/h via INTRAVENOUS
  Administered 2017-12-01: 1350 [IU]/h via INTRAVENOUS
  Administered 2017-12-03: 1450 [IU]/h via INTRAVENOUS
  Filled 2017-11-30 (×5): qty 250

## 2017-11-30 MED ORDER — ATORVASTATIN CALCIUM 80 MG PO TABS
80.0000 mg | ORAL_TABLET | Freq: Every day | ORAL | Status: DC
  Administered 2017-11-30 – 2017-12-16 (×17): 80 mg via ORAL
  Filled 2017-11-30 (×17): qty 1

## 2017-11-30 MED ORDER — RANOLAZINE ER 500 MG PO TB12
500.0000 mg | ORAL_TABLET | Freq: Two times a day (BID) | ORAL | Status: DC
  Administered 2017-11-30 – 2017-12-17 (×35): 500 mg via ORAL
  Filled 2017-11-30 (×35): qty 1

## 2017-11-30 MED ORDER — FUROSEMIDE 20 MG PO TABS
20.0000 mg | ORAL_TABLET | Freq: Every day | ORAL | Status: DC
  Administered 2017-11-30: 20 mg via ORAL
  Filled 2017-11-30 (×2): qty 1

## 2017-11-30 MED ORDER — TAMSULOSIN HCL 0.4 MG PO CAPS
0.4000 mg | ORAL_CAPSULE | Freq: Every day | ORAL | Status: DC
  Administered 2017-11-30 – 2017-12-17 (×18): 0.4 mg via ORAL
  Filled 2017-11-30 (×18): qty 1

## 2017-11-30 MED ORDER — LEVOTHYROXINE SODIUM 100 MCG PO TABS: 100.0000 ug | ORAL_TABLET | Freq: Every day | ORAL | Status: DC

## 2017-11-30 MED ORDER — LEVOTHYROXINE SODIUM 75 MCG PO TABS
150.0000 ug | ORAL_TABLET | Freq: Every day | ORAL | Status: DC
  Administered 2017-12-01 – 2017-12-17 (×17): 150 ug via ORAL
  Filled 2017-11-30 (×17): qty 2

## 2017-11-30 MED ORDER — DOCUSATE SODIUM 100 MG PO CAPS
200.0000 mg | ORAL_CAPSULE | Freq: Every day | ORAL | Status: DC
  Administered 2017-11-30 – 2017-12-17 (×18): 200 mg via ORAL
  Filled 2017-11-30 (×18): qty 2

## 2017-11-30 MED ORDER — PANTOPRAZOLE SODIUM 40 MG PO TBEC
40.0000 mg | DELAYED_RELEASE_TABLET | Freq: Every day | ORAL | Status: DC
  Administered 2017-11-30 – 2017-12-17 (×18): 40 mg via ORAL
  Filled 2017-11-30 (×18): qty 1

## 2017-11-30 MED ORDER — AMIODARONE HCL 200 MG PO TABS
200.0000 mg | ORAL_TABLET | Freq: Every day | ORAL | Status: DC
  Administered 2017-11-30 – 2017-12-17 (×18): 200 mg via ORAL
  Filled 2017-11-30 (×18): qty 1

## 2017-11-30 NOTE — Progress Notes (Signed)
Patient ID: [name], male   DOB: 01-14-1941, 77 y.o.   MRN: [id number]    [name] is a 77 yo male with h/o VT, PAD and severe systolic HF (EF 78-93%) due to mixed ischemic/nonischemic CM he is s/p HM II LVAD implant for destination therapy on October 19, 2013.VAD implantation was complicated by VT, syncope, AF/Aflutter and RHF. Required short term milrinone.    GU Event 09/01/14 had impacted ureteral stone and underwent cystoscopy and flexible ureteroscopy with lithotripsy and basket extraction.   09/2015 Hematuria and chronic nephrolithiasis. Renal US showed mild hyronephrosis and bilateral renal cysts. Dr. Risa Grill took back for repeat lithotripsy and stone extraction by ureteroscopy and J stent placement. 12/18 Recurrent hematuria. 9mm left ureteral stone. 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  4/2-18 - Episode of melena. Hgb 10.9-> 8.7. EGD/colonoscopy negative 12/18 - Melena with syncope  Hgb 6.5. Transfused 5u. 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  01/2016 R CEA. He was started on Plavix.  01/2016 Lumbar fusion L3-L5 with pedicle screws posterolateral arthrodesis with BMP, allograft  Admitted 9/17 with high fevers and productive cough. CXR negative. Treated with levaquin x 7 days for acute bronchitis. Also found to have VT and underwent DC-CV. Seen by EP and amio increased  Had ramp echo in 10/17 speed turned down to 8800 due to RV failure.  Admitted 4/18 with melena and drop in Hgb. Transfused 2u. EGD/colon negative.  Admitted 5/18 with pump stops. 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  Admitted 10/13-15/18 with slow VT and ADHF.Cardioverted in ED and amio uptitrated. Also diuresed. Saw Dr. Caryl Comes earlier this week and amio decreased.   Admitted 12/10-18/18 with syncope and recurrent GIB. Hgr 6.5. Transfused 5u. Found to  have 2 AVMs and underwent APC. Also developed hematuria and CT revealed left ureteral stone (18mm). Passed with tamulosin  Admitted 1/25 for elective ureteroscopy due to R ureteral stone.  Still with occasional hematuria. Continues with fatigue. RHC 1/25 showed low output due to RV failure (see below). Milrinone initiated at 0.125.  He does not feel much different on milrinone but has not been up walking.  Says he feels fatigued generally, sometimes short of breath.   RHC (1/25):   On 8800  RA = 12  RV = 48/15 PA = 46/18 (29) PCW = 18 v = 30 Fick cardiac output/index = 4.2/2.0 PVR = 2.6 Ao sat = 91% PA sat =43%, 42  On 9000  PA = 48/25 (32) PCW = 16 (v= 25) Fick = 3.9/1.8 Ao = 92% PA = 39%, 38%  On 8600  PA = 51/24 (36) PCW = 26 (v=38) Fick 3.9/1.8 Ao = 91% PA = 38%, 39%  On 8800 + milrinone 0.125 mcg/kg/min  PA = 49/20 (31) PCW = 23 (v= 35) Fick 4.2/2.0 Ao = 91% PA = 41%, 41%  Scheduled Meds: . amiodarone  200 mg Oral Daily  . atorvastatin  80 mg Oral q1800  . docusate sodium  200 mg Oral Daily  . furosemide  20 mg Oral Daily  . pantoprazole  40 mg Oral Daily  . ranolazine  500 mg Oral BID  . sildenafil  20 mg Oral TID  . sodium chloride flush  3 mL Intravenous Q12H  . tamsulosin  0.4 mg Oral Daily   Continuous Infusions: .  sodium chloride    . milrinone 0.125 mcg/kg/min (11/30/17 0835)   PRN Meds:.sodium chloride, acetaminophen, acetaminophen, ondansetron (ZOFRAN) IV, ondansetron (ZOFRAN) IV, sodium chloride flush   VAD interrogation: HM2.  Speed 8800, flow 4.2, PI 7.0, power 4.6.   BP (!) 84/72 (BP Location: Left Arm)   Pulse 60   Temp 98.2 F (36.8 C) (Oral)   Resp 14   Ht 6' (1.829 m)   Wt 191 lb 5.8 oz (86.8 kg)   SpO2 94%   BMI 25.95 kg/m  MAP 70s-80s  Physical Exam: GENERAL: Well appearing this am. NAD.  HEENT: Normal. NECK: Supple, JVP 8 cm. Carotids OK.  CARDIAC:  Mechanical heart sounds with LVAD hum present.  LUNGS:   CTAB, normal effort.  ABDOMEN:  NT, ND, no HSM. No bruits or masses. +BS  LVAD exit site: Well-healed and incorporated. Dressing dry and intact. No erythema or drainage. Stabilization device present and accurately applied. Driveline dressing changed daily per sterile technique. EXTREMITIES:  Warm and dry. No cyanosis, clubbing, rash, or edema.  NEUROLOGIC:  Alert & oriented x 3. Cranial nerves grossly intact. Moves all 4 extremities w/o difficulty. Affect pleasant    Telemetry: NSR with occasional PVCs, personally reviewed.   BMET    Component Value Date/Time   NA 138 11/30/2017 0658   NA 139 08/16/2014 1420   K 4.6 11/30/2017 0658   CL 109 11/30/2017 0658   CO2 19 (L) 11/30/2017 0658   GLUCOSE 113 (H) 11/30/2017 0658   BUN 23 (H) 11/30/2017 0658   BUN 22 08/16/2014 1420   CREATININE 1.33 (H) 11/30/2017 0658   CALCIUM 8.5 (L) 11/30/2017 0658   GFRNONAA 50 (L) 11/30/2017 0658   GFRAA 57 (L) 11/30/2017 0658   CBC    Component Value Date/Time   WBC 5.7 11/30/2017 0658   RBC 3.29 (L) 11/30/2017 0658   HGB 9.2 (L) 11/30/2017 0658   HGB 11.9 (L) 10/01/2017 1025   HCT 29.6 (L) 11/30/2017 0658   HCT 37.0 (L) 10/01/2017 1025   PLT 180 11/30/2017 0658   PLT 187 10/01/2017 1025   MCV 90.0 11/30/2017 0658   MCV 95 10/01/2017 1025   MCH 28.0 11/30/2017 0658   MCHC 31.1 11/30/2017 0658   RDW 17.6 (H) 11/30/2017 0658   RDW 16.6 (H) 10/01/2017 1025   LYMPHSABS 0.9 10/01/2017 1025   MONOABS 0.6 02/20/2017 2054   EOSABS 0.3 10/01/2017 1025   BASOSABS 0.0 10/01/2017 1025    ASSESSMENT AND PLAN:  1. 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 Struggling with progressive NYHA III symptoms.  [place] 1/25 showed primarily RHF with low output. Low dose milrinone 0.125 started.   - Continue milrinone 0.125 today, will have him walk in hall. Once ureteroscopy performed, will place tunneled PICC for home milrinone if he is improving on it.  - Added sildenafil 20 mg tid for  RV failure.  - Will give Lasix 20 mg daily for now with elevated RV filling pressure to see if some decompression can help.  - Continue VAD speed at 8800. VAD interrogated personally. Parameters stable. 2. Recurrent renal stone: Left ureteral stone in 12/18. Now passed with tamulosin. Will need elective ureteroscopy, currently scheduled for Wednesday.  - Hold coumadin. Start heparin when INR < 2.0.  3. 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 Continue coumadin and plavix (with h/o CVA). Admitted 4/18 with recurrent GIB. Transfused 2u. EGD/colon negative. Admitted 12/18, Hgb 6.5, Transfused 5u. EGD  with 2 AVMs treated with APC - No further overt bleeding, stable hemoglobin.  4.  Atrial arrhythmias: Paroxysmal atrial fibrillation, atrial tachycardia.  S/p DC-CV 06/04/14, DC-CV 03/02/15 and 01/19/16. Remains in NSR.  Continue to use [employer] for surveillance. - Continue amiodarone.  5. HTN: MAP controlled.  6. LVAD: VAD interrogated personally. Parameters stable.  Follow LDH.  7. CKD, stage III: Stable so far.  7. 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  Continue atorvastatin. (also warfarin INR goal 2-2.5). S/p R CEA 01/13/16. Plavix stopped 12/18 due to recurrent GIB 8. H/o amio-induced hypothyroidism: Amio restarted due to recurrent atrial arrhythmias and VT.  - Continue home levothyroxine dose.  9. VT: Underwent DC-CV 9/17. Repeat episode of slow VT 08/17/17 requiring DC-CV.  - continue amio 200 daily - Keep K > 4.0 Mg > 2.0  Davelle Anselmi,MD 9:02 AM  11/30/2017

## 2017-11-30 NOTE — Progress Notes (Signed)
Pt request that all 4 side rails are up on bed while he sleeps.

## 2017-11-30 NOTE — Progress Notes (Signed)
ANTICOAGULATION CONSULT NOTE - [place] for Heparin when INR < 2. Indication: LVAD  Allergies  Allergen Reactions  . Demerol [Meperidine] Other (See Comments)    Paralysis. Could only move eyes.     Patient Measurements: Height: 6' (182.9 cm) Weight: 191 lb 5.8 oz (86.8 kg) IBW/kg (Calculated) : 77.6 Heparin Dosing Weight: n/a  Vital Signs: Temp: 98.2 F (36.8 C) (01/26 0835) Temp Source: Oral (01/26 0835) BP: 84/72 (01/26 0835) Pulse Rate: 60 (01/26 0835)  Labs: Recent Labs    11/29/17 0547 11/29/17 1719 11/30/17 0658  HGB  --   --  9.2*  HCT  --   --  29.6*  PLT  --   --  180  LABPROT 24.2* 24.5* 23.5*  INR 2.20 2.23 2.12  CREATININE  --   --  1.33*    Estimated Creatinine Clearance: 50.2 mL/min (A) (by C-G formula based on SCr of 1.33 mg/dL (H)).   Medical History: Past Medical History:  Diagnosis Date  . Arthritis   . Automatic implantable cardioverter-defibrillator in situ    a. s/p Medtronic Evera device serial [id number]    . Bradycardia   . CAD (coronary artery disease)    a. s/p MI 1982;  s/p DES to CFX 2011;  s/p NSTEMI 4/12 in setting of VTach;  cath 7/12:  LM ok, LAD mid 30-40%, Dx's with 40%, mCFX stent patent, prox to mid RCA occluded with R to R and L to R collats.  Medical Tx was continued  . Chronic systolic heart failure ([place])    a. s/p LVAD 10/2013 for destination therapy  . CKD (chronic kidney disease)   . CVD (cerebrovascular disease)    a. s/p L CEA 2008  . Cytopenia   . Dysrhythmia    AFIB  . History of GI bleed    a. on ASA- started on plavix during 10/2015 admission for CVA  . HLD (hyperlipidemia)   . HTN (hypertension)   . Hypothyroidism   . Inappropriate therapy from implantable cardioverter-defibrillator    a. ATP for sinus tach SVT wavelet identified SVT but was no  passive (2014)  . Ischemic cardiomyopathy   . 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 Myocardial infarction ([place])    1992  . PAF  (paroxysmal atrial fibrillation) ([place])   . PVD (peripheral vascular disease) ([place])    a. 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 Shortness of breath dyspnea    W/ EXERTION   . Sleep apnea    NO CPAP NOW  HE SENT IT BACK YRS AGO  . Stroke [facility])    a. 10/2015 admission   . Ventricular tachycardia ([place])    a. s/p AICD;   h/o ICD shock;  Amiodarone Rx. S/P ablation in 2012    Medications:  Scheduled:  . amiodarone  200 mg Oral Daily  . atorvastatin  80 mg Oral q1800  . docusate sodium  200 mg Oral Daily  . furosemide  20 mg Oral Daily  . [START ON 12/01/2017] levothyroxine  150 mcg Oral QAC breakfast  . pantoprazole  40 mg Oral Daily  . ranolazine  500 mg Oral BID  . sildenafil  20 mg Oral TID  . sodium chloride flush  3 mL Intravenous Q12H  . tamsulosin  0.4  mg Oral Daily    Assessment: [name] with LVAD on warfarin at home admitted for RHC and ureteroscopy. Pharmacy consulted to hold warfarin and start heparin infusion once INR < 2.0. Last dose of warfarin was 1/24, INR on admit 2.20 now 2.12 this morning. Will continue to hold heparin and recheck INR this evening.  Goal of Therapy:  Heparin level 0.3-0.7 INR 2-2.5 Monitor platelets by anticoagulation protocol: Yes   Plan:  1. Hold warfarin 2. Recheck INR this evening at 1800, start heparin if < 2 3. F/u plans to resume warfarinafter procedure.  Arrie Senate, PharmD, BCPS PGY-2 Cardiology Pharmacy Resident Pager: [phone] 11/30/2017

## 2017-11-30 NOTE — Progress Notes (Signed)
ANTICOAGULATION CONSULT NOTE - [place] for Heparin when INR < 2. Indication: LVAD  Allergies  Allergen Reactions  . Demerol [Meperidine] Other (See Comments)    Paralysis. Could only move eyes.     Patient Measurements: Height: 6' (182.9 cm) Weight: 191 lb 5.8 oz (86.8 kg) IBW/kg (Calculated) : 77.6 Heparin Dosing Weight: n/a  Vital Signs: Temp: 98.3 F (36.8 C) (01/26 1537) Temp Source: Oral (01/26 1537) BP: 80/70 (01/26 1537) Pulse Rate: 76 (01/26 1537)  Labs: Recent Labs    11/29/17 1719 11/30/17 0658 11/30/17 1802  HGB  --  9.2*  --   HCT  --  29.6*  --   PLT  --  180  --   LABPROT 24.5* 23.5* 21.6*  INR 2.23 2.12 1.89  CREATININE  --  1.33*  --     Estimated Creatinine Clearance: 50.2 mL/min (A) (by C-G formula based on SCr of 1.33 mg/dL (H)).   Medical History: Past Medical History:  Diagnosis Date  . Arthritis   . Automatic implantable cardioverter-defibrillator in situ    a. s/p Medtronic Evera device serial [id number]    . Bradycardia   . CAD (coronary artery disease)    a. s/p MI 1982;  s/p DES to CFX 2011;  s/p NSTEMI 4/12 in setting of VTach;  cath 7/12:  LM ok, LAD mid 30-40%, Dx's with 40%, mCFX stent patent, prox to mid RCA occluded with R to R and L to R collats.  Medical Tx was continued  . Chronic systolic heart failure ([place])    a. s/p LVAD 10/2013 for destination therapy  . CKD (chronic kidney disease)   . CVD (cerebrovascular disease)    a. s/p L CEA 2008  . Cytopenia   . Dysrhythmia    AFIB  . History of GI bleed    a. on ASA- started on plavix during 10/2015 admission for CVA  . HLD (hyperlipidemia)   . HTN (hypertension)   . Hypothyroidism   . Inappropriate therapy from implantable cardioverter-defibrillator    a. ATP for sinus tach SVT wavelet identified SVT but was no  passive (2014)  . Ischemic cardiomyopathy   . 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 Myocardial infarction ([place])    1992  . PAF  (paroxysmal atrial fibrillation) ([place])   . PVD (peripheral vascular disease) ([place])    a. 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 Shortness of breath dyspnea    W/ EXERTION   . Sleep apnea    NO CPAP NOW  HE SENT IT BACK YRS AGO  . Stroke [facility])    a. 10/2015 admission   . Ventricular tachycardia ([place])    a. s/p AICD;   h/o ICD shock;  Amiodarone Rx. S/P ablation in 2012    Medications:  Scheduled:  . amiodarone  200 mg Oral Daily  . atorvastatin  80 mg Oral q1800  . docusate sodium  200 mg Oral Daily  . furosemide  20 mg Oral Daily  . [START ON 12/01/2017] levothyroxine  150 mcg Oral QAC breakfast  . pantoprazole  40 mg Oral Daily  . ranolazine  500 mg Oral BID  . sildenafil  20 mg Oral TID  . sodium chloride flush  3 mL Intravenous Q12H  . tamsulosin  0.4  mg Oral Daily    Assessment: 89 yoM with LVAD on warfarin at home admitted for RHC and ureteroscopy. Pharmacy consulted to hold warfarin and start heparin infusion once INR < 2.0. Last dose of warfarin was 1/24, INR on admit 2.20 now 1.89.  Goal of Therapy:  Heparin level 0.3-0.7 INR 2-2.5 Monitor platelets by anticoagulation protocol: Yes   Plan:   -start heparin at 1050 units/hr -Heparin level in daily wth CBC daily  Hildred Laser, Pharm D 11/30/2017 7:19 PM

## 2017-12-01 ENCOUNTER — Encounter (HOSPITAL_COMMUNITY): Payer: Self-pay | Admitting: Internal Medicine

## 2017-12-01 LAB — PROTIME-INR
INR: 1.9
PROTHROMBIN TIME: 21.6 s — AB (ref 11.4–15.2)

## 2017-12-01 LAB — BASIC METABOLIC PANEL
Anion gap: 10 (ref 5–15)
BUN: 24 mg/dL — ABNORMAL HIGH (ref 6–20)
CHLORIDE: 105 mmol/L (ref 101–111)
CO2: 21 mmol/L — ABNORMAL LOW (ref 22–32)
CREATININE: 1.65 mg/dL — AB (ref 0.61–1.24)
Calcium: 8.3 mg/dL — ABNORMAL LOW (ref 8.9–10.3)
GFR calc non Af Amer: 38 mL/min — ABNORMAL LOW (ref >60)
GFR, EST AFRICAN AMERICAN: 44 mL/min — AB (ref >60)
Glucose, Bld: 112 mg/dL — ABNORMAL HIGH (ref 65–99)
POTASSIUM: 3.8 mmol/L (ref 3.5–5.1)
SODIUM: 136 mmol/L (ref 135–145)

## 2017-12-01 LAB — HEPARIN LEVEL (UNFRACTIONATED)
Heparin Unfractionated: <0.1 IU/mL — ABNORMAL LOW (ref 0.30–0.70)
Heparin Unfractionated: 0.37 IU/mL (ref 0.30–0.70)
Heparin Unfractionated: 0.28 IU/mL — ABNORMAL LOW (ref 0.30–0.70)

## 2017-12-01 LAB — CBC
HCT: 27.2 % — ABNORMAL LOW (ref 39.0–52.0)
Hemoglobin: 8.3 g/dL — ABNORMAL LOW (ref 13.0–17.0)
MCH: 27 pg (ref 26.0–34.0)
MCHC: 30.5 g/dL (ref 30.0–36.0)
MCV: 88.6 fL (ref 78.0–100.0)
PLATELETS: 179 10*3/uL (ref 150–400)
RBC: 3.07 MIL/uL — AB (ref 4.22–5.81)
RDW: 17.5 % — ABNORMAL HIGH (ref 11.5–15.5)
WBC: 5.3 10*3/uL (ref 4.0–10.5)

## 2017-12-01 LAB — MAGNESIUM: MAGNESIUM: 1.9 mg/dL (ref 1.7–2.4)

## 2017-12-01 LAB — LACTATE DEHYDROGENASE: LDH: 270 U/L — AB (ref 98–192)

## 2017-12-01 MED ORDER — MAGNESIUM SULFATE 2 GM/50ML IV SOLN
2.0000 g | Freq: Once | INTRAVENOUS | Status: AC
  Administered 2017-12-01: 2 g via INTRAVENOUS
  Filled 2017-12-01: qty 50

## 2017-12-01 MED ORDER — POTASSIUM CHLORIDE CRYS ER 20 MEQ PO TBCR
40.0000 meq | EXTENDED_RELEASE_TABLET | Freq: Once | ORAL | Status: AC
  Administered 2017-12-01: 40 meq via ORAL
  Filled 2017-12-01: qty 2

## 2017-12-01 NOTE — Progress Notes (Signed)
[name] for Heparin Indication: LVAD  Allergies  Allergen Reactions  . Demerol [Meperidine] Other (See Comments)    Paralysis. Could only move eyes.     Patient Measurements: Height: 6' (182.9 cm) Weight: 196 lb 3.4 oz (89 kg) IBW/kg (Calculated) : 77.6 Heparin Dosing Weight:   Vital Signs: Temp: 98.4 F (36.9 C) (01/27 0411) Temp Source: Oral (01/27 0411) BP: 76/67 (01/27 0411) Pulse Rate: 80 (01/27 0500)  Labs: Recent Labs    11/30/17 0658 11/30/17 1802 12/01/17 0439  HGB 9.2*  --  8.3*  HCT 29.6*  --  27.2*  PLT 180  --  179  LABPROT 23.5* 21.6* 21.6*  INR 2.12 1.89 1.90  HEPARINUNFRC  --   --  <0.10*  CREATININE 1.33*  --   --     Medications:  Scheduled:  . amiodarone  200 mg Oral Daily  . atorvastatin  80 mg Oral q1800  . docusate sodium  200 mg Oral Daily  . furosemide  20 mg Oral Daily  . levothyroxine  150 mcg Oral QAC breakfast  . pantoprazole  40 mg Oral Daily  . ranolazine  500 mg Oral BID  . sildenafil  20 mg Oral TID  . sodium chloride flush  3 mL Intravenous Q12H  . tamsulosin  0.4 mg Oral Daily    Assessment: 51 yoM with LVAD on warfarin at home admitted for RHC and ureteroscopy. Pharmacy consulted to hold warfarin and start heparin infusion once INR < 2.0. Last dose of warfarin was 1/24, INR on admit 2.20 now 1.89.  Initial heparin level is undetectable, no issues with infusion per RN.  Goal of Therapy:  Heparin level 0.3-0.7 INR 2-2.5 Monitor platelets by anticoagulation protocol: Yes    Plan:   -Increase heparin to 1350 units/hr -Daily HL, CBC -Check confirmatory level    Harvel Quale  12/01/2017 6:27 AM

## 2017-12-01 NOTE — Progress Notes (Addendum)
[place] for Heparin Indication: LVAD  Allergies  Allergen Reactions  . Demerol [Meperidine] Other (See Comments)    Paralysis. Could only move eyes.     Patient Measurements: Height: 6' (182.9 cm) Weight: 196 lb 3.4 oz (89 kg) IBW/kg (Calculated) : 77.6 Heparin Dosing Weight:   Vital Signs: Temp: 97.6 F (36.4 C) (01/27 1539) Temp Source: Oral (01/27 1539) BP: 83/63 (01/27 1539) Pulse Rate: 72 (01/27 1539)  Labs: Recent Labs    11/30/17 0658 11/30/17 1802 12/01/17 0439 12/01/17 1417  HGB 9.2*  --  8.3*  --   HCT 29.6*  --  27.2*  --   PLT 180  --  179  --   LABPROT 23.5* 21.6* 21.6*  --   INR 2.12 1.89 1.90  --   HEPARINUNFRC  --   --  <0.10* 0.37  CREATININE 1.33*  --  1.65*  --     Medications:  Scheduled:  . amiodarone  200 mg Oral Daily  . atorvastatin  80 mg Oral q1800  . docusate sodium  200 mg Oral Daily  . levothyroxine  150 mcg Oral QAC breakfast  . pantoprazole  40 mg Oral Daily  . ranolazine  500 mg Oral BID  . sildenafil  20 mg Oral TID  . sodium chloride flush  3 mL Intravenous Q12H  . tamsulosin  0.4 mg Oral Daily    Assessment: 50 yoM with LVAD on warfarin at home admitted for RHC and ureteroscopy. Pharmacy consulted to hold warfarin and start heparin infusion once INR < 2.0. Last dose of warfarin was 1/24, INR on admit 2.20 now 1.89. -heparin level is now at goal on 1350 units/hr   Goal of Therapy:  Heparin level 0.3-0.7 INR 2-2.5 Monitor platelets by anticoagulation protocol: Yes    Plan:  -Increase heparin to 1350 units/hr -Daily HL, CBC -Check confirmatory level   Hildred Laser, Pharm D 12/01/2017 3:43 PM  Addendum -Heparin level= 0.28  Plan -Increase to 1450 units/hr --Heparin level in 8 hours and daily wth CBC daily  Hildred Laser, Pharm D 12/01/2017 9:36 PM

## 2017-12-01 NOTE — Progress Notes (Addendum)
Patient ID: [name], male   DOB: 09-25-1941, 77 y.o.   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 Required short term milrinone.    GU Event 09/01/14 had impacted ureteral stone and underwent cystoscopy and flexible ureteroscopy with lithotripsy and basket extraction.   09/2015 Hematuria and chronic nephrolithiasis. Renal US showed mild hyronephrosis and bilateral renal cysts. Dr. Risa Grill took back for repeat lithotripsy and stone extraction by ureteroscopy and J stent placement. 12/18 Recurrent hematuria. 33mm left ureteral stone. 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  4/2-18 - Episode of melena. Hgb 10.9-> 8.7. EGD/colonoscopy negative 12/18 - Melena with syncope  Hgb 6.5. Transfused 5u. 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  01/2016 R CEA. He was started on Plavix.  01/2016 Lumbar fusion L3-L5 with pedicle screws posterolateral arthrodesis with BMP, allograft  Admitted 9/17 with high fevers and productive cough. CXR negative. Treated with levaquin x 7 days for acute bronchitis. Also found to have VT and underwent DC-CV. Seen by EP and amio increased  Had ramp echo in 10/17 speed turned down to 8800 due to RV failure.  Admitted 4/18 with melena and drop in Hgb. Transfused 2u. EGD/colon negative.  Admitted 5/18 with pump stops. 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  Admitted 10/13-15/18 with slow VT and ADHF.Cardioverted in ED and amio uptitrated. Also diuresed. Saw Dr. Caryl Comes earlier this week and amio decreased.   Admitted 12/10-18/18 with syncope and recurrent GIB. Hgr 6.5. Transfused 5u. Found to  have 2 AVMs and underwent APC. Also developed hematuria and CT revealed left ureteral stone (22mm). Passed with tamulosin  Admitted 1/25 for elective ureteroscopy due to R ureteral stone.  Still with occasional hematuria. Continues with fatigue. RHC 1/25 showed low output due to RV failure (see below). Milrinone initiated at 0.125.  Walking in hall yesterday feels like he is less fatigued on milrinone.  Creatinine up a bit today to 1.65.  INR 1.9 and now on heparin gtt.   RHC (1/25):   On 8800  RA = 12  RV = 48/15 PA = 46/18 (29) PCW = 18 v = 30 Fick cardiac output/index = 4.2/2.0 PVR = 2.6 Ao sat = 91% PA sat =43%, 42  On 9000  PA = 48/25 (32) PCW = 16 (v= 25) Fick = 3.9/1.8 Ao = 92% PA = 39%, 38%  On 8600  PA = 51/24 (36) PCW = 26 (v=38) Fick 3.9/1.8 Ao = 91% PA = 38%, 39%  On 8800 + milrinone 0.125 mcg/kg/min  PA = 49/20 (31) PCW = 23 (v= 35) Fick 4.2/2.0 Ao = 91% PA = 41%, 41%  Scheduled Meds: . amiodarone  200 mg Oral Daily  . atorvastatin  80 mg Oral q1800  . docusate sodium  200 mg Oral Daily  . furosemide  20 mg Oral Daily  . levothyroxine  150 mcg Oral QAC breakfast  . pantoprazole  40 mg Oral Daily  . potassium chloride  40 mEq Oral Once  . ranolazine  500 mg Oral BID  . sildenafil  20 mg Oral TID  .  sodium chloride flush  3 mL Intravenous Q12H  . tamsulosin  0.4 mg Oral Daily   Continuous Infusions: . sodium chloride    . heparin 1,350 Units/hr (12/01/17 9357)  . magnesium sulfate 1 - 4 g bolus IVPB    . milrinone 0.125 mcg/kg/min (12/01/17 0500)   PRN Meds:.sodium chloride, acetaminophen, acetaminophen, ondansetron (ZOFRAN) IV, ondansetron (ZOFRAN) IV, sodium chloride flush   VAD interrogation: HM2.  Speed 8800, flow 3.9, PI 7.7, power 4.5.   BP 93/73 (BP Location: Left Arm)   Pulse (!) 42   Temp 97.8 F (36.6 C) (Oral)   Resp 11   Ht 6' (1.829 m)   Wt 196 lb 3.4 oz (89 kg)   SpO2 92%   BMI 26.61 kg/m  MAP  70s-80s  Physical Exam: GENERAL: Well appearing this am. NAD.  HEENT: Normal. NECK: Supple, JVP 8 cm. Carotids OK.  CARDIAC:  Mechanical heart sounds with LVAD hum present.  LUNGS:  CTAB, normal effort.  ABDOMEN:  NT, ND, no HSM. No bruits or masses. +BS  LVAD exit site: Well-healed and incorporated. Dressing dry and intact. No erythema or drainage. Stabilization device present and accurately applied. Driveline dressing changed daily per sterile technique. EXTREMITIES:  Warm and dry. No cyanosis, clubbing, rash, or edema.  NEUROLOGIC:  Alert & oriented x 3. Cranial nerves grossly intact. Moves all 4 extremities w/o difficulty. Affect pleasant    Telemetry: NSR with occasional PVCs, personally reviewed.   BMET    Component Value Date/Time   NA 136 12/01/2017 0439   NA 139 08/16/2014 1420   K 3.8 12/01/2017 0439   CL 105 12/01/2017 0439   CO2 21 (L) 12/01/2017 0439   GLUCOSE 112 (H) 12/01/2017 0439   BUN 24 (H) 12/01/2017 0439   BUN 22 08/16/2014 1420   CREATININE 1.65 (H) 12/01/2017 0439   CALCIUM 8.3 (L) 12/01/2017 0439   GFRNONAA 38 (L) 12/01/2017 0439   GFRAA 44 (L) 12/01/2017 0439   CBC    Component Value Date/Time   WBC 5.3 12/01/2017 0439   RBC 3.07 (L) 12/01/2017 0439   HGB 8.3 (L) 12/01/2017 0439   HGB 11.9 (L) 10/01/2017 1025   HCT 27.2 (L) 12/01/2017 0439   HCT 37.0 (L) 10/01/2017 1025   PLT 179 12/01/2017 0439   PLT 187 10/01/2017 1025   MCV 88.6 12/01/2017 0439   MCV 95 10/01/2017 1025   MCH 27.0 12/01/2017 0439   MCHC 30.5 12/01/2017 0439   RDW 17.5 (H) 12/01/2017 0439   RDW 16.6 (H) 10/01/2017 1025   LYMPHSABS 0.9 10/01/2017 1025   MONOABS 0.6 02/20/2017 2054   EOSABS 0.3 10/01/2017 1025   BASOSABS 0.0 10/01/2017 1025    ASSESSMENT AND PLAN:  1. 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 Struggling with progressive NYHA III symptoms.  [place] 1/25 showed primarily RHF with low output. Low dose milrinone 0.125 started.  He  says that he is somewhat less fatigued on this when walking. He does not look volume overloaded on exam.  - Continue milrinone 0.125 today. Once ureteroscopy performed, will place tunneled PICC for home milrinone if he continues to appear improved. - Added sildenafil 20 mg tid for RV failure.  - Creatinine to 1.65, stop Lasix for now.   - Continue VAD speed at 8800. VAD interrogated personally. Parameters stable. 2. Recurrent renal stone: Left ureteral stone in 12/18. Now passed with tamulosin. Will need elective ureteroscopy, currently scheduled for Wednesday.  - INR <  2, continue heparin gtt while off warfarin.  3. 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 Continue coumadin and plavix (with h/o CVA). Admitted 4/18 with recurrent GIB. Transfused 2u. EGD/colon negative. Admitted 12/18, Hgb 6.5, Transfused 5u. EGD with 2 AVMs treated with APC - No further overt bleeding.  4.  Atrial arrhythmias: Paroxysmal atrial fibrillation, atrial tachycardia.  S/p DC-CV 06/04/14, DC-CV 03/02/15 and 01/19/16. Remains in NSR.  Continue to use [employer] for surveillance. - Continue amiodarone.  5. HTN: MAP controlled.  6. LVAD: VAD interrogated personally. Parameters stable.  LDH stable.   7. CKD, stage III: Creatinine higher today, hold Lasix.  7. 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  Continue atorvastatin. (also warfarin INR goal 2-2.5). S/p R CEA 01/13/16. Plavix stopped 12/18 due to recurrent GIB 8. H/o amio-induced hypothyroidism: Amio restarted due to recurrent atrial arrhythmias and VT.  - Continue home levothyroxine dose.  9. VT: Underwent DC-CV 9/17. Repeat episode of slow VT 08/17/17 requiring DC-CV.  - continue amio 200 daily - Keep K > 4.0 Mg > 2.0  Theador Hawthorne 8:47 AM   12/01/2017

## 2017-12-02 DIAGNOSIS — N200 Calculus of kidney: Secondary | ICD-10-CM

## 2017-12-02 LAB — BASIC METABOLIC PANEL
ANION GAP: 6 (ref 5–15)
BUN: 23 mg/dL — AB (ref 6–20)
CHLORIDE: 107 mmol/L (ref 101–111)
CO2: 22 mmol/L (ref 22–32)
Calcium: 8.4 mg/dL — ABNORMAL LOW (ref 8.9–10.3)
Creatinine, Ser: 1.38 mg/dL — ABNORMAL HIGH (ref 0.61–1.24)
GFR calc Af Amer: 55 mL/min — ABNORMAL LOW (ref >60)
GFR calc non Af Amer: 47 mL/min — ABNORMAL LOW (ref >60)
GLUCOSE: 111 mg/dL — AB (ref 65–99)
POTASSIUM: 4.4 mmol/L (ref 3.5–5.1)
SODIUM: 135 mmol/L (ref 135–145)

## 2017-12-02 LAB — URINALYSIS, ROUTINE W REFLEX MICROSCOPIC: Squamous Epithelial / LPF: NONE SEEN

## 2017-12-02 LAB — HEPARIN LEVEL (UNFRACTIONATED)
HEPARIN UNFRACTIONATED: 0.52 [IU]/mL (ref 0.30–0.70)
Heparin Unfractionated: 0.43 IU/mL (ref 0.30–0.70)

## 2017-12-02 LAB — CBC
HCT: 27.6 % — ABNORMAL LOW (ref 39.0–52.0)
Hemoglobin: 8.8 g/dL — ABNORMAL LOW (ref 13.0–17.0)
MCH: 28.1 pg (ref 26.0–34.0)
MCHC: 31.9 g/dL (ref 30.0–36.0)
MCV: 88.2 fL (ref 78.0–100.0)
PLATELETS: 174 10*3/uL (ref 150–400)
RBC: 3.13 MIL/uL — AB (ref 4.22–5.81)
RDW: 17.7 % — ABNORMAL HIGH (ref 11.5–15.5)
WBC: 5 10*3/uL (ref 4.0–10.5)

## 2017-12-02 LAB — PROTIME-INR
INR: 1.75
Prothrombin Time: 20.3 seconds — ABNORMAL HIGH (ref 11.4–15.2)

## 2017-12-02 LAB — LACTATE DEHYDROGENASE: LDH: 275 U/L — AB (ref 98–192)

## 2017-12-02 MED FILL — Lidocaine HCl Local Inj 1%: INTRAMUSCULAR | Qty: 20 | Status: AC

## 2017-12-02 NOTE — Progress Notes (Addendum)
[place] for Heparin Indication: LVAD  Allergies  Allergen Reactions  . Demerol [Meperidine] Other (See Comments)    Paralysis. Could only move eyes.     Patient Measurements: Height: 6' (182.9 cm) Weight: 197 lb 8 oz (89.6 kg) IBW/kg (Calculated) : 77.6   Vital Signs: Temp: 98.8 F (37.1 C) (01/28 0328) Temp Source: Oral (01/28 0328) BP: 87/74 (01/28 0328) Pulse Rate: 69 (01/28 0328)  Labs: Recent Labs    11/30/17 0658 11/30/17 1802  12/01/17 0439 12/01/17 1417 12/01/17 1945 12/02/17 0500  HGB 9.2*  --   --  8.3*  --   --  8.8*  HCT 29.6*  --   --  27.2*  --   --  27.6*  PLT 180  --   --  179  --   --  174  LABPROT 23.5* 21.6*  --  21.6*  --   --  20.3*  INR 2.12 1.89  --  1.90  --   --  1.75  HEPARINUNFRC  --   --    < > <0.10* 0.37 0.28* 0.43  CREATININE 1.33*  --   --  1.65*  --   --  1.38*   < > = values in this interval not displayed.    Medications:  Scheduled:  . amiodarone  200 mg Oral Daily  . atorvastatin  80 mg Oral q1800  . docusate sodium  200 mg Oral Daily  . levothyroxine  150 mcg Oral QAC breakfast  . pantoprazole  40 mg Oral Daily  . ranolazine  500 mg Oral BID  . sildenafil  20 mg Oral TID  . sodium chloride flush  3 mL Intravenous Q12H  . tamsulosin  0.4 mg Oral Daily    Assessment: 81 yoM with LVAD on warfarin at home admitted for RHC and ureteroscopy. Pharmacy consulted to hold warfarin and start heparin infusion once INR < 2.0. Last dose of warfarin was 1/24, INR on admit 2.20 now 1.89. -heparin level is now at goal on 1450 units/hr   Goal of Therapy:  Heparin level 0.3-0.7 INR 2-2.5 Monitor platelets by anticoagulation protocol: Yes    Plan -Continue heparin at 1450 units/hr -Daily HL, CBC -check confirmatory level   Harvel Quale  12/02/2017 6:20 AM

## 2017-12-02 NOTE — Progress Notes (Signed)
[place] for Heparin Indication: LVAD  Allergies  Allergen Reactions  . Demerol [Meperidine] Other (See Comments)    Paralysis. Could only move eyes.     Patient Measurements: Height: 6' (182.9 cm) Weight: 197 lb 8 oz (89.6 kg) IBW/kg (Calculated) : 77.6   Vital Signs: Temp: 97.8 F (36.6 C) (01/28 1148) Temp Source: Oral (01/28 1148) BP: 90/72 (01/28 0800) Pulse Rate: 69 (01/28 0800)  Labs: Recent Labs    11/30/17 0658 11/30/17 1802 12/01/17 0439  12/01/17 1945 12/02/17 0500 12/02/17 1236  HGB 9.2*  --  8.3*  --   --  8.8*  --   HCT 29.6*  --  27.2*  --   --  27.6*  --   PLT 180  --  179  --   --  174  --   LABPROT 23.5* 21.6* 21.6*  --   --  20.3*  --   INR 2.12 1.89 1.90  --   --  1.75  --   HEPARINUNFRC  --   --  <0.10*   < > 0.28* 0.43 0.52  CREATININE 1.33*  --  1.65*  --   --  1.38*  --    < > = values in this interval not displayed.    Medications:  Scheduled:  . amiodarone  200 mg Oral Daily  . atorvastatin  80 mg Oral q1800  . docusate sodium  200 mg Oral Daily  . levothyroxine  150 mcg Oral QAC breakfast  . pantoprazole  40 mg Oral Daily  . ranolazine  500 mg Oral BID  . sildenafil  20 mg Oral TID  . sodium chloride flush  3 mL Intravenous Q12H  . tamsulosin  0.4 mg Oral Daily    Assessment: 22 yoM with LVAD on warfarin at home admitted for RHC and ureteroscopy. Pharmacy consulted to hold warfarin and start heparin infusion once INR < 2.0. Last dose of warfarin was 1/24, INR on admit 2.20 now 1.75.  Heparin level was therapeutic at 0.52, on 1450 units/hr. Hgb is stable at 8.8, platelets remain stable. No signs/symptoms of bleeding. Continuing to hold warfarin. Plan for uroscopy on Wednesday.   Goal of Therapy:  Heparin level 0.3-0.7 INR 2-2.5 Monitor platelets by anticoagulation protocol: Yes   Plan -Continue heparin at 1450 units/hr -Daily HL, CBC  Doylene Canard, PharmD Clinical Pharmacist   Pager: [phone] Clinical Phone for 12/02/2017 until 3:30pm: [phone] If after 3:30pm, please call main pharmacy at [phone] 12/02/2017 1:48 PM

## 2017-12-02 NOTE — Progress Notes (Addendum)
LVAD Coordinator Rounding Note:  Admitted 11/29/17 due for elective ureteroscopy due to right ureteral stone. RHC today shows low output due to RV failure. Milrinone initiated.   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 Cyndia Bent with oversewn aortic valve under DT criteria.  Pt reports his urine is "coke colored", but denies any pain. LDH stable, denies increased HF symptoms. Says he feels "a little" better with initiation of Milrinone.   Vital signs: Temp: 98.0 HR: 92 Doppler Pressure: 91/78 (83) Automatic BP:  86 O2 Sat:  92% on RA Wt: 194>191>196>197 lbs  GTTS: Milrinone 0.125 mcg/kg/min Heparin - per pharmD  LVAD interrogation reveals:  Speed: 8800 Flow:  4.5 Power:  4.8w PI: 6.5 Alarms: none Events:  Rare PI  Fixed speed: 8800 Low speed limit: 8200  Drive Line: [place] dressing dry and intact; anchor intact and accurately applied. Weekly dressing changes; Due 12/08/17. Last changed 12/01/17 by wife.  Labs:  LDH trend: 362>332>270>275  INR trend: 2.20>2.12>1.89>1.90>1.75  Anticoagulation Plan: -INR Goal:  2.0 - 2.5 -ASA Dose: no ASA (removed 02/2017 due to GI bleed)  Device: -Dual Medtronic ICD -Therapies: on at 200 bpm - Last device check 10/02/17  Significant Events on VAD Support:  09/01/14>>ureteral stone with cystoscopy and flexible ureteroscopy with lithotripsy and basket extraction 02/2015 >>GIB with 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 Started on Plavix 3/17>>Lumbar fusion L3-L5 9/17>>VT with DC-CV; amio increased 10/17>>ramp with RV failure; speed decreased to 8800 02/2017> GIB (removed ASA, scopes negative) 03/2017>percutaneous lead repair after pump stops  10/18>>Slow VT. Cardioverted in ED and amiodarone uptitrated 12/18>>melena with scope. Hgb 6.5. EGD with 2 AVMs with APC 12/18>>recurrent hematuria.  6 mm left ureteral stone.    Plan/Recommendations:   1. VAD coordinator contacted Dr. Purvis Sheffield office and left message re: patient's admission. Confirmed Mr. Hollenbach is posted for ureteroscopy on OR schedule Wednesday 12/04/17 at 11:00 am with general anesthesia. 2. Dressing change weekly by wife. 3. Please call VAD pager if any issues with VAD equipment or questions, concerns.

## 2017-12-02 NOTE — Progress Notes (Signed)
Advanced Heart Failure VAD Team Note  Subjective:    Remains on milrinone 0.125 mcg/kg/min.   Feeling OK this am. Feels slightly better overall on milrinone. Denies SOB or CP. No edema. Walking halls.    Still with dark urine at times. Plan for uroscopy Wednesday 12/04/17.  INR 1.75 LDH 275  LVAD INTERROGATION:  HeartMate II LVAD:  Flow 4.9 liters/min, speed 8800, power 6.0, PI 4.8.  Rare PI events.   Objective:    Vital Signs:   Temp:  [97.6 F (36.4 C)-98.8 F (37.1 C)] 98.8 F (37.1 C) (01/28 0328) Pulse Rate:  [42-76] 69 (01/28 0328) Resp:  [11-21] 13 (01/28 0328) BP: (83-95)/(63-85) 91/78 (01/28 0600) SpO2:  [92 %-97 %] 92 % (01/28 0328) Weight:  [197 lb 8 oz (89.6 kg)] 197 lb 8 oz (89.6 kg) (01/28 0328) Last BM Date: 12/01/17 Mean arterial Pressure 83  Intake/Output:   Intake/Output Summary (Last 24 hours) at 12/02/2017 0812 Last data filed at 12/02/2017 0600 Gross per 24 hour  Intake 2254.73 ml  Output 1625 ml  Net 629.73 ml     Physical Exam    General:  Well appearing. No resp difficulty HEENT: normal anicteric Neck: supple. JVP 7-8 cm. Carotids 2+ bilat; no bruits. No lymphadenopathy or thyromegaly appreciated. Cor: Mechanical heart sounds with LVAD hum present. Lungs: clear Abdomen: soft, nontender, nondistended. No hepatosplenomegaly. No bruits or masses. Good bowel sounds. Driveline: C/D/I; securement device intact and driveline incorporated Extremities: no cyanosis, clubbing, rash, edema Neuro: alert & orientedx3, cranial nerves grossly intact. moves all 4 extremities w/o difficulty. Affect pleasant  Telemetry   NSR 70 - occasional PVCs, personally reviewed.   EKG    No new tracings.    Labs   Basic Metabolic Panel: Recent Labs  Lab 11/25/17 1138 11/30/17 0658 12/01/17 0439 12/02/17 0500  NA 133* 138 136 135  K 4.4 4.6 3.8 4.4  CL 103 109 105 107  CO2 20* 19* 21* 22  GLUCOSE 110* 113* 112* 111*  BUN 35* 23* 24* 23*  CREATININE  1.45* 1.33* 1.65* 1.38*  CALCIUM 8.7* 8.5* 8.3* 8.4*  MG  --   --  1.9  --     Liver Function Tests: No results for input(s): AST, ALT, ALKPHOS, BILITOT, PROT, ALBUMIN in the last 168 hours. No results for input(s): LIPASE, AMYLASE in the last 168 hours. No results for input(s): AMMONIA in the last 168 hours.  CBC: Recent Labs  Lab 11/25/17 1138 11/30/17 0658 12/01/17 0439 12/02/17 0500  WBC 6.3 5.7 5.3 5.0  HGB 10.0* 9.2* 8.3* 8.8*  HCT 32.9* 29.6* 27.2* 27.6*  MCV 89.6 90.0 88.6 88.2  PLT 203 180 179 174    INR: Recent Labs  Lab 11/29/17 1719 11/30/17 0658 11/30/17 1802 12/01/17 0439 12/02/17 0500  INR 2.23 2.12 1.89 1.90 1.75    Other results:  EKG:    Imaging    No results found.   Medications:     Scheduled Medications: . amiodarone  200 mg Oral Daily  . atorvastatin  80 mg Oral q1800  . docusate sodium  200 mg Oral Daily  . levothyroxine  150 mcg Oral QAC breakfast  . pantoprazole  40 mg Oral Daily  . ranolazine  500 mg Oral BID  . sildenafil  20 mg Oral TID  . sodium chloride flush  3 mL Intravenous Q12H  . tamsulosin  0.4 mg Oral Daily     Infusions: . sodium chloride    . heparin  1,450 Units/hr (12/02/17 0600)  . milrinone 0.125 mcg/kg/min (12/02/17 0600)     PRN Medications:  sodium chloride, acetaminophen, acetaminophen, ondansetron (ZOFRAN) IV, ondansetron (ZOFRAN) IV, sodium chloride flush   VAD EVENTS   GU Event 09/01/14 had impacted ureteral stone and underwent cystoscopy and flexible ureteroscopy with lithotripsy and basket extraction.   09/2015 Hematuria and chronic nephrolithiasis. Renal US showed mild hyronephrosis and bilateral renal cysts. Dr. Risa Grill took back for repeat lithotripsy and stone extraction by ureteroscopy and J stent placement. 12/18 Recurrent hematuria. 48mm left ureteral stone. 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  4/2-18 -  Episode of melena. Hgb 10.9-> 8.7. EGD/colonoscopy negative 12/18 - Melena with syncope  Hgb 6.5. Transfused 5u. 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  01/2016 R CEA. He was started on Plavix.  01/2016 Lumbar fusion L3-L5 with pedicle screws posterolateral arthrodesis with BMP, allograft  Patient Profile  [name] is a 77 y.o. male  with h/o VT, PAD and severe systolic HF (EF 51-02%) due to mixed ischemic/nonischemic CM he is s/p HM II LVAD implant for destination therapy on October 19, 2013.VAD implantation was complicated by VT, syncope, AF/Aflutter and RHF. Required short term milrinone.   Admitted 1/25 for elective ureteroscopy due to R ureteral stone.  Still with occasional hematuria. Continues with fatigue. RHC 1/25 showed low output due to RV failure (see below). Milrinone initiated at 0.125.  Assessment/Plan:    1. 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 Struggling with progressive NYHA III symptoms.  [place] 1/25 showed primarily RHF with low output. Low dose milrinone 0.125 started.  He says that he is somewhat less fatigued on this when walking.  - Volume status stable on exam.  - Continue milrinone 0.125 today.  - Will place tunneled PICC for home milrinone if he continues to appear improved s/p uroscopy.  - Continue Sildenafil 20 mg TID with RV failure.  - Creatinine to 1.65, stop Lasix for now.   - Continue VAD speed at 8800. VAD interrogated personally. Parameters stable.  2. Recurrent renal stone: Left ureteral stone in 12/18. Now passed with tamulosin. Will need elective ureteroscopy, currently scheduled for Wednesday.  - INR < 2, continue heparin gtt while off warfarin. No change.  3. 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 Continue coumadin and plavix (with h/o CVA). Admitted 4/18 with recurrent GIB. Transfused 2u. EGD/colon negative. Admitted 12/18, Hgb 6.5, Transfused 5u. EGD with 2  AVMs treated with APC - No further overt bleeding.  4.  Atrial arrhythmias: Paroxysmal atrial fibrillation, atrial tachycardia.  S/p DC-CV 06/04/14, DC-CV 03/02/15 and 01/19/16.  - Remains in NSR. Continue to use [employer] for surveillance. - Continue amiodarone.  5. HTN: MAP controlled.  6. LVAD: VAD interrogated personally. Parameters stable.  LDH stable.   7. AKI on CKD, stage III:  - Creatinine improved with holding lasix yesterday.   8. 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  - Continue atorvastatin. (also warfarin INR goal 2-2.5). S/p R CEA 01/13/16. Plavix stopped 12/18 due to recurrent GIB 9. H/o amio-induced hypothyroidism: Amio restarted due to recurrent atrial arrhythmias and VT.  - Continue home levothyroxine dose.  10. VT:  - Underwent DC-CV 9/17. Repeat episode of slow VT 08/17/17 requiring DC-CV.  - Continue amio 200 daily - Goal K > 4.0  Mg > 2.0  I reviewed the LVAD parameters from today, and compared the results to the patient's prior recorded data.  No programming changes were made.  The LVAD is functioning within specified parameters.  The patient performs LVAD self-test daily.  LVAD interrogation was negative for any significant power changes, alarms or PI events/speed drops.  LVAD equipment check completed and is in good working order.  Back-up equipment present.   LVAD education done on emergency procedures and precautions and reviewed exit site care.  Length of Stay: 3  Annamaria Helling 12/02/2017, 8:12 AM  VAD Team --- VAD ISSUES ONLY--- Pager [phone] (7am - 7am)  Advanced Heart Failure Team  Pager [phone] (M-F; 7a - 4p)  Please contact [facility] for night-coverage after hours (4p -7a ) and weekends on [email or web address]   Patient seen and examined with the above-signed Advanced Practice Provider and/or Housestaff. I personally reviewed laboratory data, imaging studies and relevant notes. I independently examined the patient and formulated the  important aspects of the plan. I have edited the note to reflect any of my changes or salient points. I have personally discussed the plan with the patient and/or family.  Only minimal symptomatic benefit from milrinone. Will increase to 0.2 mcg/kg/min. Need to be careful to not push too hard with risk of recurrent VT and atrial tach. Watch monitor closely. Volume status and VAD parameters ok.   Off coumadin for ureteroscopy. INR 1.75. On heparin. Discussed dosing with PharmD personally.  Will check UA given episodes of dark urine. With low LDH doubt hemolysis.   Glori Bickers, MD  11:00 AM

## 2017-12-03 ENCOUNTER — Inpatient Hospital Stay (HOSPITAL_COMMUNITY): Payer: Medicare Other

## 2017-12-03 DIAGNOSIS — R310 Gross hematuria: Secondary | ICD-10-CM

## 2017-12-03 DIAGNOSIS — R319 Hematuria, unspecified: Secondary | ICD-10-CM

## 2017-12-03 DIAGNOSIS — N390 Urinary tract infection, site not specified: Secondary | ICD-10-CM

## 2017-12-03 LAB — GLUCOSE, CAPILLARY: Glucose-Capillary: 134 mg/dL — ABNORMAL HIGH (ref 65–99)

## 2017-12-03 LAB — CBC
HEMATOCRIT: 25.8 % — AB (ref 39.0–52.0)
Hemoglobin: 8.1 g/dL — ABNORMAL LOW (ref 13.0–17.0)
MCH: 27.8 pg (ref 26.0–34.0)
MCHC: 31.4 g/dL (ref 30.0–36.0)
MCV: 88.7 fL (ref 78.0–100.0)
Platelets: 174 10*3/uL (ref 150–400)
RBC: 2.91 MIL/uL — AB (ref 4.22–5.81)
RDW: 18 % — ABNORMAL HIGH (ref 11.5–15.5)
WBC: 5.3 10*3/uL (ref 4.0–10.5)

## 2017-12-03 LAB — PROTIME-INR
INR: 1.58
Prothrombin Time: 18.8 seconds — ABNORMAL HIGH (ref 11.4–15.2)

## 2017-12-03 LAB — BASIC METABOLIC PANEL
Anion gap: 9 (ref 5–15)
BUN: 23 mg/dL — AB (ref 6–20)
CHLORIDE: 106 mmol/L (ref 101–111)
CO2: 20 mmol/L — ABNORMAL LOW (ref 22–32)
CREATININE: 1.46 mg/dL — AB (ref 0.61–1.24)
Calcium: 8.4 mg/dL — ABNORMAL LOW (ref 8.9–10.3)
GFR calc Af Amer: 51 mL/min — ABNORMAL LOW (ref >60)
GFR calc non Af Amer: 44 mL/min — ABNORMAL LOW (ref >60)
GLUCOSE: 110 mg/dL — AB (ref 65–99)
Potassium: 4.1 mmol/L (ref 3.5–5.1)
Sodium: 135 mmol/L (ref 135–145)

## 2017-12-03 LAB — URINE CULTURE: Culture: <10000 — AB

## 2017-12-03 LAB — PREPARE RBC (CROSSMATCH)

## 2017-12-03 LAB — HEPARIN LEVEL (UNFRACTIONATED): Heparin Unfractionated: 0.42 IU/mL (ref 0.30–0.70)

## 2017-12-03 LAB — LACTATE DEHYDROGENASE: LDH: 280 U/L — ABNORMAL HIGH (ref 98–192)

## 2017-12-03 MED ORDER — CEFAZOLIN SODIUM-DEXTROSE 2-4 GM/100ML-% IV SOLN
2.0000 g | INTRAVENOUS | Status: AC
  Administered 2017-12-04: 2 g via INTRAVENOUS
  Filled 2017-12-03 (×2): qty 100

## 2017-12-03 MED ORDER — DEXTROSE 5 % IV SOLN
1.0000 g | INTRAVENOUS | Status: DC
  Administered 2017-12-03 – 2017-12-10 (×8): 1 g via INTRAVENOUS
  Filled 2017-12-03 (×8): qty 10

## 2017-12-03 MED ORDER — SODIUM CHLORIDE 0.9 % IV SOLN
Freq: Once | INTRAVENOUS | Status: AC
  Administered 2017-12-03: 13:00:00 via INTRAVENOUS

## 2017-12-03 NOTE — Progress Notes (Signed)
Advanced Heart Failure VAD Team Note  Subjective:    Milrinone increased to 0.2 yesterday. Does not notice a difference.  Still weak. Being dark bloody urine. UA with blood, WBCs, TNTC, ++bacteria. No fevers or chills. No flank pain.   Remains on heparin. LDH ok. INR 1.58   Plan for uroscopy Wednesday 12/04/17.  INR 1.58 LDH 280  LVAD INTERROGATION:  HeartMate II LVAD:  Flow 5.0 liters/min, speed 8800, power 5.0, PI 7.2.  Rare PI events.   Objective:    Vital Signs:   Temp:  [97.6 F (36.4 C)-98.7 F (37.1 C)] 97.6 F (36.4 C) (01/29 0758) Pulse Rate:  [56-77] 68 (01/29 0758) Resp:  [11-15] 12 (01/29 0758) BP: (73-110)/(63-88) 110/88 (01/29 0758) SpO2:  [94 %] 94 % (01/28 1641) Weight:  [89.3 kg (196 lb 14.4 oz)] 89.3 kg (196 lb 14.4 oz) (01/29 0616) Last BM Date: 12/01/17 Mean arterial Pressure 83  Intake/Output:   Intake/Output Summary (Last 24 hours) at 12/03/2017 0949 Last data filed at 12/03/2017 0906 Gross per 24 hour  Intake 456.92 ml  Output 1400 ml  Net -943.08 ml     Physical Exam     General:  Sitting in chair. Pale. Weak. NAD.  HEENT: normal  Neck: supple. JVP 6  Carotids 2+ bilat; no bruits. No lymphadenopathy or thryomegaly appreciated. Cor: LVAD hum.  Lungs: Clear. Abdomen: obese soft, nontender, non-distended. No hepatosplenomegaly. No bruits or masses. Good bowel sounds. Driveline site clean. Anchor in place.  Extremities: no cyanosis, clubbing, rash. Warm no edema  Neuro: alert & oriented x 3. No focal deficits. Moves all 4 without problem   Telemetry   NSR 60-70 - occasional PVCs, personally reviewed.   EKG    No new tracings.    Labs   Basic Metabolic Panel: Recent Labs  Lab 11/30/17 0658 12/01/17 0439 12/02/17 0500 12/03/17 0255  NA 138 136 135 135  K 4.6 3.8 4.4 4.1  CL 109 105 107 106  CO2 19* 21* 22 20*  GLUCOSE 113* 112* 111* 110*  BUN 23* 24* 23* 23*  CREATININE 1.33* 1.65* 1.38* 1.46*  CALCIUM 8.5* 8.3* 8.4* 8.4*    MG  --  1.9  --   --     Liver Function Tests: No results for input(s): AST, ALT, ALKPHOS, BILITOT, PROT, ALBUMIN in the last 168 hours. No results for input(s): LIPASE, AMYLASE in the last 168 hours. No results for input(s): AMMONIA in the last 168 hours.  CBC: Recent Labs  Lab 11/30/17 0658 12/01/17 0439 12/02/17 0500 12/03/17 0255  WBC 5.7 5.3 5.0 5.3  HGB 9.2* 8.3* 8.8* 8.1*  HCT 29.6* 27.2* 27.6* 25.8*  MCV 90.0 88.6 88.2 88.7  PLT 180 179 174 174    INR: Recent Labs  Lab 11/30/17 0658 11/30/17 1802 12/01/17 0439 12/02/17 0500 12/03/17 0255  INR 2.12 1.89 1.90 1.75 1.58    Other results:  EKG:    Imaging   No results found.   Medications:     Scheduled Medications: . amiodarone  200 mg Oral Daily  . atorvastatin  80 mg Oral q1800  . docusate sodium  200 mg Oral Daily  . levothyroxine  150 mcg Oral QAC breakfast  . pantoprazole  40 mg Oral Daily  . ranolazine  500 mg Oral BID  . sildenafil  20 mg Oral TID  . sodium chloride flush  3 mL Intravenous Q12H  . tamsulosin  0.4 mg Oral Daily    Infusions: . sodium  chloride    . sodium chloride    . cefTRIAXone (ROCEPHIN)  IV    . heparin 1,450 Units/hr (12/03/17 9622)  . milrinone 0.2 mcg/kg/min (12/03/17 0600)    PRN Medications: sodium chloride, acetaminophen, acetaminophen, ondansetron (ZOFRAN) IV, ondansetron (ZOFRAN) IV, sodium chloride flush   VAD EVENTS   GU Event 09/01/14 had impacted ureteral stone and underwent cystoscopy and flexible ureteroscopy with lithotripsy and basket extraction.   09/2015 Hematuria and chronic nephrolithiasis. Renal US showed mild hyronephrosis and bilateral renal cysts. Dr. Risa [name] took back for repeat lithotripsy and stone extraction by ureteroscopy and J stent placement. 12/18 Recurrent hematuria. 77mm left ureteral stone. 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  4/2-18 - Episode  of melena. Hgb 10.9-> 8.7. EGD/colonoscopy negative 12/18 - Melena with syncope  Hgb 6.5. Transfused 5u. 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  01/2016 R CEA. He was started on Plavix.  01/2016 Lumbar fusion L3-L5 with pedicle screws posterolateral arthrodesis with BMP, allograft  Patient Profile  [name] is a 77 y.o. 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 Required short term milrinone.   Admitted 1/25 for elective ureteroscopy due to R ureteral stone.  Still with occasional hematuria. Continues with fatigue. RHC 1/25 showed low output due to RV failure (see below). Milrinone initiated at 0.125.  Assessment/Plan:    1. 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 Struggling with progressive NYHA III symptoms.  [name] 1/25 showed primarily RHF with low output. Milrinone increased to 0.2. - No real symptomatic improvement with milrinone. Will continue for now but then try to wean prior to d/c - Volume status stable on exam. Off lasix for now.  - Continue Sildenafil 20 mg TID with RV failure.  - Continue VAD speed at 8800. VAD interrogated personally. Parameters stable.  2. Recurrent renal stone with ongoing hematuria and UTI: Left ureteral stone in 12/18.  - Has persistent hematuria. UA with UTI. Will start ceftriaxone  - d/w Dr. Gloriann [name] in Urology. Will repeat CT today.  - Plan ureteroscopy tomorrow. Coumadin on hold. On heparin. Discussed dosing with PharmD personally. 3. 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 Continue coumadin and plavix (with h/o CVA). Admitted 4/18 with recurrent GIB. Transfused 2u. EGD/colon negative. Admitted 12/18, Hgb 6.5, Transfused 5u. EGD with 2 AVMs treated with APC - Hgb down to 8.1  4.   Atrial arrhythmias: Paroxysmal atrial fibrillation, atrial tachycardia.  S/p DC-CV 06/04/14, DC-CV 03/02/15 and 01/19/16.  - Remains in NSR.  - Continue amiodarone.  5. HTN:  - MAP ok 6. LVAD:  - VAD interrogated personally. Parameters stable. 7. AKI on CKD, stage III:  - Creatinine stable   8. 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  - Continue atorvastatin. (also warfarin INR goal 2-2.5). S/p R CEA 01/13/16. Plavix stopped 12/18 due to recurrent GIB 9. H/o amio-induced hypothyroidism: Amio restarted due to recurrent atrial arrhythmias and VT.  - Continue home levothyroxine dose.  10. VT:  - Underwent DC-CV 9/17. Repeat episode of slow VT 08/17/17 requiring DC-CV.  - Continue amio 200 daily - Goal K > 4.0 Mg >  2.0  I reviewed the LVAD parameters from today, and compared the results to the patient's prior recorded data.  No programming changes were made.  The LVAD is functioning within specified parameters.  The patient performs LVAD self-test daily.  LVAD interrogation was negative for any significant power changes, alarms or PI events/speed drops.  LVAD equipment check completed and is in good working order.  Back-up equipment present.   LVAD education done on emergency procedures and precautions and reviewed exit site care.  Length of Stay: [place], MD 12/03/2017, 9:49 AM  VAD Team --- VAD ISSUES ONLY--- Pager [phone] (7am - 7am)  Advanced Heart Failure Team  Pager [phone] (M-F; 7a - 4p)  Please contact [facility] for night-coverage after hours (4p -7a ) and weekends on [email or web address]

## 2017-12-03 NOTE — Progress Notes (Signed)
LVAD Coordinator Rounding Note:  Admitted 11/29/17 due for elective ureteroscopy due to right ureteral stone. RHC today shows low output due to RV failure. Milrinone initiated.   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 Cyndia Bent with oversewn aortic valve under DT criteria.  Pts urine is bloody today. Pt denies any pain. Pt states he feels slightly better with the Milrinone.   Vital signs: Temp: 97.6 HR: 77 Doppler Pressure: 88 Automatic BP: 110/88 (96) O2 Sat:  92% on RA Wt: 194>191>196>197>196 lbs  GTTS: Milrinone 0.125 mcg/kg/min Heparin 1450 u/hr  LVAD interrogation reveals:  Speed: 8800 Flow:  4.5 Power:  4.8w PI: 6.5 Alarms: none Events:  Rare PI  Fixed speed: 8800 Low speed limit: 8200  Drive Line: Sorbaview dressing dry and intact; anchor intact and accurately applied. Weekly dressing changes; Due 12/08/17. Last changed 12/01/17 by wife.  Labs:  LDH trend: 362>332>270>275>280  INR trend: 2.20>2.12>1.89>1.90>1.75>1.58  Anticoagulation Plan: -INR Goal:  2.0 - 2.5 -ASA Dose: no ASA (removed 02/2017 due to GI bleed)  Device: -Dual Medtronic ICD -Therapies: on at 200 bpm - Last device check 10/02/17  Significant Events on VAD Support:  09/01/14>>ureteral stone with cystoscopy and flexible ureteroscopy with lithotripsy and basket extraction 02/2015 >>GIB with 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 Started on Plavix 3/17>>Lumbar fusion L3-L5 9/17>>VT with DC-CV; amio increased 10/17>>ramp with RV failure; speed decreased to 8800 02/2017> GIB (removed ASA, scopes negative) 03/2017>percutaneous lead repair after pump stops  10/18>>Slow VT. Cardioverted in ED and amiodarone uptitrated 12/18>>melena with scope. Hgb 6.5. EGD with 2 AVMs with APC 12/18>>recurrent hematuria. 6 mm left ureteral stone.     Plan/Recommendations:   1. VAD coordinator will accompany pt for ureteroscopy to the OR on Wednesday 12/04/17 at 11:00 am with general anesthesia. 2. Pt will have a CT scan today to assess kidney stone. 3. Dressing change weekly by wife. 4. Please call VAD pager if any issues with VAD equipment or questions, concerns.  Tanda Rockers RN, BSN VAD Coordinator 24/7 Pager [phone]

## 2017-12-03 NOTE — Progress Notes (Signed)
[name] will provide [facility] services for home Milrinone for [name] if needed for home.  [facility] will partner with [facility] who will provide [facility] nursing services for pt as he is in [place] which is outside of [place] service area.  If patient discharges after hours, please call [phone].   Larry Sierras 12/03/2017, 4:25 PM

## 2017-12-03 NOTE — Progress Notes (Signed)
[place] for Heparin Indication: LVAD  Allergies  Allergen Reactions  . Demerol [Meperidine] Other (See Comments)    Paralysis. Could only move eyes.     Patient Measurements: Height: 6' (182.9 cm) Weight: 196 lb 14.4 oz (89.3 kg) IBW/kg (Calculated) : 77.6   Vital Signs: Temp: 98.5 F (36.9 C) (01/29 0332) Temp Source: Oral (01/29 0332) BP: 91/77 (01/29 0600) Pulse Rate: 71 (01/28 2337)  Labs: Recent Labs    12/01/17 0439  12/02/17 0500 12/02/17 1236 12/03/17 0255  HGB 8.3*  --  8.8*  --  8.1*  HCT 27.2*  --  27.6*  --  25.8*  PLT 179  --  174  --  174  LABPROT 21.6*  --  20.3*  --  18.8*  INR 1.90  --  1.75  --  1.58  HEPARINUNFRC <0.10*   < > 0.43 0.52 0.42  CREATININE 1.65*  --  1.38*  --  1.46*   < > = values in this interval not displayed.    Medications:  Scheduled:  . amiodarone  200 mg Oral Daily  . atorvastatin  80 mg Oral q1800  . docusate sodium  200 mg Oral Daily  . levothyroxine  150 mcg Oral QAC breakfast  . pantoprazole  40 mg Oral Daily  . ranolazine  500 mg Oral BID  . sildenafil  20 mg Oral TID  . sodium chloride flush  3 mL Intravenous Q12H  . tamsulosin  0.4 mg Oral Daily    Assessment: 73 yoM with LVAD on warfarin at home admitted for RHC and ureteroscopy. Pharmacy consulted to hold warfarin and start heparin infusion once INR < 2.0. Last dose of warfarin was 1/24, INR on admit 2.20 now 1.58.  Heparin level remains therapeutic at 0.42, on 1450 units/hr. Hgb is stable at 8.1, platelets remain stable. LDH is stable. No signs/symptoms of bleeding outside of blood in the urine which is noted to be worse compared to yesterday. No infusion issues. Continuing to hold warfarin. Plan for uroscopy on Wednesday.   Goal of Therapy:  Heparin level 0.3-0.7 INR 2-2.5 Monitor platelets by anticoagulation protocol: Yes   Plan -Continue heparin at 1450 units/hr -Daily HL, CBC  Doylene Canard,  PharmD Clinical Pharmacist  Pager: [phone] Clinical Phone for 12/03/2017 until 3:30pm: [phone] If after 3:30pm, please call main pharmacy at [phone] 12/03/2017 7:04 AM

## 2017-12-03 NOTE — Progress Notes (Signed)
Observed pt walking independently, doing well. Will not follow pt. Yves Dill CES, ACSM 7:36 AM 12/03/2017

## 2017-12-04 ENCOUNTER — Encounter (HOSPITAL_COMMUNITY): Payer: Self-pay | Admitting: Anesthesiology

## 2017-12-04 ENCOUNTER — Encounter (HOSPITAL_COMMUNITY)
Admission: AD | Disposition: A | Discharge status: Home / Self Care | Payer: Self-pay | Source: Home / Self Care | Attending: Internal Medicine

## 2017-12-04 ENCOUNTER — Inpatient Hospital Stay (HOSPITAL_COMMUNITY): Payer: Medicare Other | Admitting: Certified Registered Nurse Anesthetist

## 2017-12-04 ENCOUNTER — Inpatient Hospital Stay (HOSPITAL_COMMUNITY): Payer: Medicare Other

## 2017-12-04 HISTORY — PX: HOLMIUM LASER APPLICATION: SHX5852

## 2017-12-04 HISTORY — PX: CYSTOSCOPY WITH RETROGRADE PYELOGRAM, URETEROSCOPY AND STENT PLACEMENT: SHX5789

## 2017-12-04 HISTORY — PX: TRANSURETHRAL RESECTION OF BLADDER TUMOR: SHX2575

## 2017-12-04 LAB — CBC
HCT: 27.6 % — ABNORMAL LOW (ref 39.0–52.0)
HEMOGLOBIN: 8.7 g/dL — AB (ref 13.0–17.0)
MCH: 27.4 pg (ref 26.0–34.0)
MCHC: 31.5 g/dL (ref 30.0–36.0)
MCV: 87.1 fL (ref 78.0–100.0)
PLATELETS: 193 10*3/uL (ref 150–400)
RBC: 3.17 MIL/uL — AB (ref 4.22–5.81)
RDW: 17.7 % — ABNORMAL HIGH (ref 11.5–15.5)
WBC: 5.5 10*3/uL (ref 4.0–10.5)

## 2017-12-04 LAB — PREPARE RBC (CROSSMATCH)

## 2017-12-04 LAB — BASIC METABOLIC PANEL
Anion gap: 8 (ref 5–15)
BUN: 15 mg/dL (ref 6–20)
CALCIUM: 8.3 mg/dL — AB (ref 8.9–10.3)
CO2: 21 mmol/L — AB (ref 22–32)
CREATININE: 1.33 mg/dL — AB (ref 0.61–1.24)
Chloride: 105 mmol/L (ref 101–111)
GFR calc non Af Amer: 50 mL/min — ABNORMAL LOW (ref >60)
GFR, EST AFRICAN AMERICAN: 57 mL/min — AB (ref >60)
GLUCOSE: 110 mg/dL — AB (ref 65–99)
Potassium: 4.1 mmol/L (ref 3.5–5.1)
Sodium: 134 mmol/L — ABNORMAL LOW (ref 135–145)

## 2017-12-04 LAB — LACTATE DEHYDROGENASE: LDH: 269 U/L — AB (ref 98–192)

## 2017-12-04 LAB — PROTIME-INR
INR: 1.48
Prothrombin Time: 17.8 seconds — ABNORMAL HIGH (ref 11.4–15.2)

## 2017-12-04 LAB — HEPARIN LEVEL (UNFRACTIONATED): Heparin Unfractionated: 0.46 IU/mL (ref 0.30–0.70)

## 2017-12-04 SURGERY — CYSTOURETEROSCOPY, WITH RETROGRADE PYELOGRAM AND STENT INSERTION
Anesthesia: General | Site: Ureter

## 2017-12-04 MED ORDER — 0.9 % SODIUM CHLORIDE (POUR BTL) OPTIME
TOPICAL | Status: DC | PRN
  Administered 2017-12-04: 1000 mL

## 2017-12-04 MED ORDER — PROMETHAZINE HCL 25 MG/ML IJ SOLN: 6.2500 mg | INTRAMUSCULAR | Status: DC | PRN

## 2017-12-04 MED ORDER — IOPAMIDOL (ISOVUE-300) INJECTION 61%
INTRAVENOUS | Status: AC
  Filled 2017-12-04: qty 50

## 2017-12-04 MED ORDER — SUGAMMADEX SODIUM 200 MG/2ML IV SOLN
INTRAVENOUS | Status: DC | PRN
  Administered 2017-12-04: 400 mg via INTRAVENOUS

## 2017-12-04 MED ORDER — STERILE WATER FOR IRRIGATION IR SOLN
Status: DC | PRN
  Administered 2017-12-04: 1000 mL

## 2017-12-04 MED ORDER — ONDANSETRON HCL 4 MG/2ML IJ SOLN
INTRAMUSCULAR | Status: AC
  Filled 2017-12-04: qty 2

## 2017-12-04 MED ORDER — FENTANYL CITRATE (PF) 250 MCG/5ML IJ SOLN
INTRAMUSCULAR | Status: AC
  Filled 2017-12-04: qty 5

## 2017-12-04 MED ORDER — SUGAMMADEX SODIUM 500 MG/5ML IV SOLN
INTRAVENOUS | Status: AC
  Filled 2017-12-04: qty 5

## 2017-12-04 MED ORDER — LIDOCAINE HCL 2 % EX GEL
CUTANEOUS | Status: AC
  Filled 2017-12-04: qty 20

## 2017-12-04 MED ORDER — SODIUM CHLORIDE 0.9 % IV SOLN
INTRAVENOUS | Status: DC | PRN
  Administered 2017-12-04: 10 mL

## 2017-12-04 MED ORDER — ONDANSETRON HCL 4 MG/2ML IJ SOLN
INTRAMUSCULAR | Status: DC | PRN
  Administered 2017-12-04: 4 mg via INTRAVENOUS

## 2017-12-04 MED ORDER — SODIUM CHLORIDE 0.9 % IR SOLN
Status: DC | PRN
  Administered 2017-12-04 (×6): 3000 mL

## 2017-12-04 MED ORDER — SODIUM CHLORIDE 0.9 % IV SOLN: Freq: Once | INTRAVENOUS | Status: DC

## 2017-12-04 MED ORDER — ROCURONIUM BROMIDE 10 MG/ML (PF) SYRINGE
PREFILLED_SYRINGE | INTRAVENOUS | Status: DC | PRN
  Administered 2017-12-04: 10 mg via INTRAVENOUS
  Administered 2017-12-04: 20 mg via INTRAVENOUS
  Administered 2017-12-04: 50 mg via INTRAVENOUS
  Administered 2017-12-04: 20 mg via INTRAVENOUS

## 2017-12-04 MED ORDER — DEXAMETHASONE SODIUM PHOSPHATE 10 MG/ML IJ SOLN
INTRAMUSCULAR | Status: DC | PRN
  Administered 2017-12-04: 10 mg via INTRAVENOUS

## 2017-12-04 MED ORDER — FENTANYL CITRATE (PF) 100 MCG/2ML IJ SOLN: 25.0000 ug | INTRAMUSCULAR | Status: DC | PRN

## 2017-12-04 MED ORDER — FENTANYL CITRATE (PF) 100 MCG/2ML IJ SOLN
INTRAMUSCULAR | Status: DC | PRN
  Administered 2017-12-04: 50 ug via INTRAVENOUS
  Administered 2017-12-04: 100 ug via INTRAVENOUS

## 2017-12-04 MED ORDER — ETOMIDATE 2 MG/ML IV SOLN
INTRAVENOUS | Status: DC | PRN
  Administered 2017-12-04: 12 mg via INTRAVENOUS

## 2017-12-04 MED ORDER — LACTATED RINGERS IV SOLN: INTRAVENOUS | Status: DC

## 2017-12-04 MED ORDER — PHENYLEPHRINE HCL 10 MG/ML IJ SOLN
INTRAVENOUS | Status: DC | PRN
  Administered 2017-12-04: 25 ug/min via INTRAVENOUS

## 2017-12-04 MED ORDER — LIDOCAINE 2% (20 MG/ML) 5 ML SYRINGE
INTRAMUSCULAR | Status: DC | PRN
  Administered 2017-12-04: 40 mg via INTRAVENOUS

## 2017-12-04 MED ORDER — MEPERIDINE HCL 25 MG/ML IJ SOLN: 6.2500 mg | INTRAMUSCULAR | Status: DC | PRN

## 2017-12-04 MED ORDER — LACTATED RINGERS IV SOLN
INTRAVENOUS | Status: DC
  Administered 2017-12-04: 11:00:00 via INTRAVENOUS

## 2017-12-04 MED ORDER — DEXAMETHASONE SODIUM PHOSPHATE 10 MG/ML IJ SOLN
INTRAMUSCULAR | Status: AC
  Filled 2017-12-04: qty 1

## 2017-12-04 SURGICAL SUPPLY — 43 items
ADAPTER CATH URET PLST 4-6FR (CATHETERS) IMPLANT
ADPR CATH URET STRL DISP 4-6FR (CATHETERS)
APL SKNCLS STERI-STRIP NONHPOA (GAUZE/BANDAGES/DRESSINGS)
BAG URINE DRAINAGE (UROLOGICAL SUPPLIES) IMPLANT
BAG URO CATCHER STRL LF (MISCELLANEOUS) ×4 IMPLANT
BENZOIN TINCTURE PRP APPL 2/3 (GAUZE/BANDAGES/DRESSINGS) IMPLANT
BLADE 10 SAFETY STRL DISP (BLADE) IMPLANT
CATH FOLEY 2WAY SLVR  5CC 16FR (CATHETERS)
CATH FOLEY 2WAY SLVR 5CC 16FR (CATHETERS) IMPLANT
CATH HEMA 3WAY 30CC 24FR COUDE (CATHETERS) ×4 IMPLANT
CATH URET 5FR 28IN CONE TIP (BALLOONS)
CATH URET 5FR 70CM CONE TIP (BALLOONS) IMPLANT
CONT SPEC 4OZ CLIKSEAL STRL BL (MISCELLANEOUS) ×4 IMPLANT
DRAPE CAMERA CLOSED 9X96 (DRAPES) ×4 IMPLANT
FIBER LASER TRAC TIP (UROLOGICAL SUPPLIES) ×4 IMPLANT
GLOVE BIOGEL M STRL SZ7.5 (GLOVE) ×4 IMPLANT
GLOVE BIOGEL PI IND STRL 6 (GLOVE) ×3 IMPLANT
GLOVE BIOGEL PI IND STRL 7.0 (GLOVE) ×3 IMPLANT
GLOVE BIOGEL PI IND STRL 7.5 (GLOVE) ×3 IMPLANT
GLOVE BIOGEL PI INDICATOR 6 (GLOVE) ×1
GLOVE BIOGEL PI INDICATOR 7.0 (GLOVE) ×1
GLOVE BIOGEL PI INDICATOR 7.5 (GLOVE) ×1
GOWN STRL REUS W/ TWL LRG LVL3 (GOWN DISPOSABLE) ×6 IMPLANT
GOWN STRL REUS W/ TWL XL LVL3 (GOWN DISPOSABLE) ×3 IMPLANT
GOWN STRL REUS W/TWL LRG LVL3 (GOWN DISPOSABLE) ×8
GOWN STRL REUS W/TWL XL LVL3 (GOWN DISPOSABLE) ×4
GUIDEWIRE COOK  .035 (WIRE) IMPLANT
KIT TURNOVER KIT B (KITS) ×4 IMPLANT
LOOP CUT BIPOLAR 24F LRG (ELECTROSURGICAL) ×8 IMPLANT
MANIFOLD NEPTUNE II (INSTRUMENTS) ×4 IMPLANT
NS IRRIG 1000ML POUR BTL (IV SOLUTION) ×4 IMPLANT
PACK CYSTO (CUSTOM PROCEDURE TRAY) ×4 IMPLANT
PAD ARMBOARD 7.5X6 YLW CONV (MISCELLANEOUS) ×8 IMPLANT
PLUG CATH AND CAP STER (CATHETERS) IMPLANT
SHEATH URETERAL 12FRX35CM (MISCELLANEOUS) ×4 IMPLANT
STENT URET 6FRX24 CONTOUR (STENTS) IMPLANT
STENT URET 6FRX26 CONTOUR (STENTS) ×4 IMPLANT
SYR 30ML LL (SYRINGE) ×4 IMPLANT
SYRINGE CONTROL L 12CC (SYRINGE) ×4 IMPLANT
SYRINGE IRR TOOMEY STRL 70CC (SYRINGE) ×4 IMPLANT
SYRINGE TOOMEY DISP (SYRINGE) IMPLANT
TUBE CONNECTING 20X1/4 (TUBING) ×4 IMPLANT
WIRE COONS/BENSON .038X145CM (WIRE) IMPLANT

## 2017-12-04 NOTE — Progress Notes (Addendum)
[place] for Heparin Indication: LVAD  Allergies  Allergen Reactions  . Demerol [Meperidine] Other (See Comments)    Paralysis. Could only move eyes.     Patient Measurements: Height: 6' (182.9 cm) Weight: 197 lb 14.4 oz (89.8 kg) IBW/kg (Calculated) : 77.6   Vital Signs: Temp: 97.6 F (36.4 C) (01/30 0435) Temp Source: Oral (01/30 0435) BP: 89/76 (01/30 0808) Pulse Rate: 70 (01/30 0435)  Labs: Recent Labs    12/02/17 0500 12/02/17 1236 12/03/17 0255 12/04/17 0238  HGB 8.8*  --  8.1* 8.7*  HCT 27.6*  --  25.8* 27.6*  PLT 174  --  174 193  LABPROT 20.3*  --  18.8* 17.8*  INR 1.75  --  1.58 1.48  HEPARINUNFRC 0.43 0.52 0.42 0.46  CREATININE 1.38*  --  1.46* 1.33*    Medications:  Scheduled:  . amiodarone  200 mg Oral Daily  . atorvastatin  80 mg Oral q1800  . docusate sodium  200 mg Oral Daily  . levothyroxine  150 mcg Oral QAC breakfast  . pantoprazole  40 mg Oral Daily  . ranolazine  500 mg Oral BID  . sildenafil  20 mg Oral TID  . sodium chloride flush  3 mL Intravenous Q12H  . tamsulosin  0.4 mg Oral Daily    Assessment: 63 yoM with LVAD on warfarin at home admitted for RHC and ureteroscopy. Pharmacy consulted to hold warfarin and start heparin infusion once INR < 2.0. Last dose of warfarin was 1/24, INR on admit 2.20 now 1.58.  Heparin level remains therapeutic at 0.46, on 1450 units/hr. Hgb is stable at 8.8 - s/p 1 PRBCs yesterday, platelets remain stable. LDH is stable. No signs/symptoms of bleeding outside of blood in the urine. No infusion issues. Continuing to hold warfarin. Plan for uroscopy on today.   Goal of Therapy:  Heparin level 0.3-0.7 INR 2-2.5 Monitor platelets by anticoagulation protocol: Yes   Plan -Continue heparin at 1450 units/hr - currently stopped for procedure -Daily HL, CBC -Will follow up after procedure for anticoagulation plan  Doylene Canard, PharmD Clinical Pharmacist  Pager:  [phone] Clinical Phone for 12/04/2017 until 3:30pm: [phone] If after 3:30pm, please call main pharmacy at [phone] 12/04/2017 8:17 AM    ADDENDUM Per discussion with HF team and urology, heparin and warfarin are to remain on hold. Any associated orders have been discontinued. Will follow along with team on timing of restart for anticoagulation.  Doylene Canard, PharmD Clinical Pharmacist  Pager: [phone] Phone: [phone]

## 2017-12-04 NOTE — Anesthesia Procedure Notes (Signed)
Procedure Name: Intubation Date/Time: 12/04/2017 11:32 AM Performed by: Kyung Rudd, CRNA Pre-anesthesia Checklist: Patient identified, Emergency Drugs available, Suction available and Patient being monitored Patient Re-evaluated:Patient Re-evaluated prior to induction Oxygen Delivery Method: Circle system utilized Preoxygenation: Pre-oxygenation with 100% oxygen Induction Type: IV induction Ventilation: Mask ventilation without difficulty and Oral airway inserted - appropriate to patient size Laryngoscope Size: Mac and 4 Grade View: Grade I Tube type: Oral Tube size: 7.5 mm Number of attempts: 1 Airway Equipment and Method: Stylet Placement Confirmation: ETT inserted through vocal cords under direct vision,  positive ETCO2 and breath sounds checked- equal and bilateral Secured at: 21 cm Tube secured with: Tape Dental Injury: Teeth and Oropharynx as per pre-operative assessment

## 2017-12-04 NOTE — Transfer of Care (Signed)
Immediate Anesthesia Transfer of Care Note  Patient: [name]  Procedure(s) Performed: CYSTOSCOPY WITH BILATERAL RETROGRADE PYELOGRAM, LEFT URETEROSCOPY AND STENT PLACEMENT (Bilateral Ureter) HOLMIUM LASER APPLICATION (Left ) TRANSURETHRAL RESECTION OF BLADDER TUMOR (TURBT) (N/A Bladder)  Patient Location: PACU  Anesthesia Type:General  Level of Consciousness: awake, alert  and oriented  Airway & Oxygen Therapy: Patient Spontanous Breathing and Patient connected to face mask oxygen  Post-op Assessment: Report given to RN, Post -op Vital signs reviewed and stable and Patient moving all extremities  Post vital signs: Reviewed and stable  Last Vitals:  Vitals:   12/04/17 0808 12/04/17 0838  BP: (!) 89/76 (!) 89/76  Pulse:  78  Resp:  18  Temp:  36.8 C  SpO2:  91%    Last Pain:  Vitals:   12/04/17 0838  TempSrc: Oral  PainSc:          Complications: No apparent anesthesia complications

## 2017-12-04 NOTE — Anesthesia Preprocedure Evaluation (Addendum)
Anesthesia Evaluation  Patient identified by MRN, date of birth, ID band Patient awake    Reviewed: Allergy & Precautions, NPO status , Patient's Chart, lab work & pertinent test results  Airway Mallampati: I  TM Distance: >3 FB Neck ROM: Full    Dental  (+) Edentulous Upper, Edentulous Lower, Dental Advisory Given   Pulmonary sleep apnea , former smoker,    breath sounds clear to auscultation       Cardiovascular hypertension, + CAD, + Past MI, + Cardiac Stents, + Peripheral Vascular Disease and +CHF  + dysrhythmias Atrial Fibrillation + Cardiac Defibrillator  Rhythm:Regular Rate:Normal  s/p LVAD 2014    Neuro/Psych CVA negative psych ROS   GI/Hepatic negative GI ROS,   Endo/Other  Hypothyroidism   Renal/GU CRFRenal disease     Musculoskeletal  (+) Arthritis ,   Abdominal   Peds  Hematology   Anesthesia Other Findings   Reproductive/Obstetrics                         Anesthesia Physical Anesthesia Plan  ASA: IV  Anesthesia Plan: General   Post-op Pain Management:    Induction: Intravenous  PONV Risk Score and Plan: 3 and Ondansetron and Treatment may vary due to age or medical condition  Airway Management Planned: LMA  Additional Equipment: None  Intra-op Plan:   Post-operative Plan: Extubation in OR  Informed Consent: I have reviewed the patients History and Physical, chart, labs and discussed the procedure including the risks, benefits and alternatives for the proposed anesthesia with the patient or authorized representative who has indicated his/her understanding and acceptance.     Plan Discussed with: CRNA  Anesthesia Plan Comments:         Lab Results  Component Value Date   WBC 5.5 12/04/2017   HGB 8.7 (L) 12/04/2017   HCT 27.6 (L) 12/04/2017   MCV 87.1 12/04/2017   PLT 193 12/04/2017   Lab Results  Component Value Date   CREATININE 1.33 (H) 12/04/2017    BUN 15 12/04/2017   NA 134 (L) 12/04/2017   K 4.1 12/04/2017   CL 105 12/04/2017   CO2 21 (L) 12/04/2017   Lab Results  Component Value Date   INR 1.48 12/04/2017   INR 1.58 12/03/2017   INR 1.75 12/02/2017   Echo: - Left ventricle: The cavity size was mildly dilated. Wall   thickness was normal. - Aorta: Aortic root is mildly dilated at 41 mm - Left atrium: The atrium was severely dilated. - Right ventricle: The cavity size was mildly dilated. Systolic   function was moderately to severely reduced. - Right atrium: The atrium was mildly dilated.  Anesthesia Quick Evaluation

## 2017-12-04 NOTE — Progress Notes (Signed)
LVAD Coordinator Rounding Note:  Admitted 11/29/17 due for elective ureteroscopy due to right ureteral stone. RHC today shows low output due to RV failure. Milrinone initiated.   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 Cyndia Bent with oversewn aortic valve under DT criteria.  Pts urine is bloody today. Pt denies any pain. CT renal stone study completed 12/03/17. Pt has been NPO overnight.  Heparin gtt off at 8:15 am. OR case scheduled for 11:00am.  Vital signs: Temp: 98.2 HR: 78 Doppler Pressure: 82 Automatic BP: 89/76 (81) O2 Sat:  97% on RA Wt: 194>191>196>197>196>197 lbs  Weight: 194>191>196>197>196>197 lbs  GTTS: Milrinone 0.2 mcg/kg/min Heparin - off at 8:15 am (pre op)  LVAD interrogation reveals:  Speed: 8800 Flow:  4.6 Power:  4.7w PI: 6.4 Alarms: none Events:  4 PI's  Fixed speed: 8800 Low speed limit: 8200  Drive Line: Sorbaview dressing dry and intact; anchor intact and accurately applied. Weekly dressing changes; Due 12/08/17. Last changed 12/01/17 by wife.  Labs:  LDH trend: 362>332>270>275>280>269  INR trend: 2.20>2.12>1.89>1.90>1.75>1.58>1.48  Anticoagulation Plan: - INR Goal:  2.0 - 2.5 (coumadin on hold for upcoming surgery) - ASA Dose: no ASA (removed 02/2017 due to GI bleed) - Heparin bridge  Device: -Dual Medtronic ICD -Therapies: on at 200 bpm - Last device check 10/02/17  Significant Events on VAD Support:  09/01/14>>ureteral stone with cystoscopy and flexible ureteroscopy with lithotripsy and basket extraction 02/2015 >>GIB with 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 Started on Plavix 3/17>>Lumbar fusion L3-L5 9/17>>VT with DC-CV; amio increased 10/17>>ramp with RV failure; speed decreased to 8800 02/2017> GIB (removed ASA, scopes negative) 03/2017>percutaneous lead repair after pump stops   10/18>>Slow VT. Cardioverted in ED and amiodarone uptitrated 12/18>>melena with scope. Hgb 6.5. EGD with 2 AVMs with APC 12/18>>recurrent hematuria. 6 mm left ureteral stone.    Plan/Recommendations:   1. VAD coordinator will accompany pt for ureteroscopy in the OR today. 2. Dressing change weekly by wife; due 12/08/17. 3. Please call VAD pager if any issues with VAD equipment or questions, concerns.  Zada Girt RN, BSN VAD Coordinator 24/7 Pager [phone]

## 2017-12-04 NOTE — Interval H&P Note (Deleted)
History and Physical Interval Note:  12/04/2017 10:17 AM  [name]  has presented today for surgery, with the diagnosis of LEFT URETERAL STONE  The various methods of treatment have been discussed with the patient and family. After consideration of risks, benefits and other options for treatment, the patient has consented to  Procedure(s): CYSTOSCOPY WITH RETROGRADE PYELOGRAM, URETEROSCOPY AND STENT PLACEMENT (Left) HOLMIUM LASER APPLICATION (Left) as a surgical intervention .  The patient's history has been reviewed, patient examined, no change in status, stable for surgery.  I have reviewed the patient's chart and labs.  Questions were answered to the patient's satisfaction.     Marton Redwood, III

## 2017-12-04 NOTE — Progress Notes (Signed)
[facility] paged and notified of patient's MAP of 98 and doppler of 94. Patient was given neo during surgery this afternoon, will continue to monitor for increased MAPs and wait for advice from physician.  Physician returned page and advised RN to continue to monitor.

## 2017-12-04 NOTE — Interval H&P Note (Signed)
History and Physical Interval Note:  12/04/2017 10:18 AM  [name]  has presented today for surgery, with the diagnosis of LEFT URETERAL STONE  The various methods of treatment have been discussed with the patient and family. After consideration of risks, benefits and other options for treatment, the patient has consented to  Procedure(s): CYSTOSCOPY WITH RETROGRADE PYELOGRAM, URETEROSCOPY AND STENT PLACEMENT (Left) HOLMIUM LASER APPLICATION (Left) , possible transurethral resection of bladder tumor, right retrograde pyelogram with possible ureteroscopy with possible interventions as a surgical intervention .  The patient's history has been reviewed, patient examined, no change in status, stable for surgery.  I have reviewed the patient's chart and labs.  Questions were answered to the patient's satisfaction.     Marton Redwood, III

## 2017-12-04 NOTE — H&P (Signed)
H&P Physician requesting consult: Glori Bickers  Chief Complaint: hematuria, left ureteral calculus  History of Present Illness: 77 year old male with a history of LVAD was noted to have gross hematuria. He underwent a CT scan of the abdomen pelvis several weeks ago while he was hospitalized. This revealed a left proximal ureteral calculus without upstream hydronephrosis.he has had no pain. Given the persistence of the stone, the decision was made to proceed with a left ureteroscopy with laser lithotripsy and ureteral stent placement. He has been hospitalized and has been on the heart failure service being transitioned over to heparin. On Friday, he developed gross hematuria. A repeat CT scan yesterday showed increasing density in the base of the bladder near the right ureter vesicular junction concerning for blood clot versus mass lesion. There was also persistence of the left ureteral calculus. Patient has no complaints other than gross hematuria.  Past Medical History:  Diagnosis Date  . Arthritis   . Automatic implantable cardioverter-defibrillator in situ    a. s/p Medtronic Evera device serial [id number]    . Bradycardia   . CAD (coronary artery disease)    a. s/p MI 1982;  s/p DES to CFX 2011;  s/p NSTEMI 4/12 in setting of VTach;  cath 7/12:  LM ok, LAD mid 30-40%, Dx's with 40%, mCFX stent patent, prox to mid RCA occluded with R to R and L to R collats.  Medical Tx was continued  . Chronic systolic heart failure ([place])    a. s/p LVAD 10/2013 for destination therapy  . CKD (chronic kidney disease)   . CVD (cerebrovascular disease)    a. s/p L CEA 2008  . Cytopenia   . Dysrhythmia    AFIB  . History of GI bleed    a. on ASA- started on plavix during 10/2015 admission for CVA  . HLD (hyperlipidemia)   . HTN (hypertension)   . Hypothyroidism   . Inappropriate therapy from implantable cardioverter-defibrillator    a. ATP for sinus tach SVT wavelet identified SVT but was no   passive (2014)  . Ischemic cardiomyopathy   . 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 L Ear   . Myocardial infarction ([place])    1992  . PAF (paroxysmal atrial fibrillation) ([place])   . PVD (peripheral vascular disease) ([place])    a. 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 Shortness of breath dyspnea    W/ EXERTION   . Sleep apnea    NO CPAP NOW  HE SENT IT BACK YRS AGO  . Stroke [facility])    a. 10/2015 admission   . Ventricular tachycardia ([place])    a. s/p AICD;   h/o ICD shock;  Amiodarone Rx. S/P ablation in 2012   Past Surgical History:  Procedure Laterality Date  . BACK SURGERY    . CARDIAC CATHETERIZATION     "several" (05/25/2013)  . CARDIAC DEFIBRILLATOR PLACEMENT  2003; 2005; 05/25/2013   2014: Medtronic Evera device serial number [id number]  . CARDIAC ELECTROPHYSIOLOGY STUDY AND ABLATION  2012  . CARDIOVERSION N/A 10/15/2013   Procedure: CARDIOVERSION;  Surgeon: Jolaine Artist, MD;  Location: [place];  Service: Cardiovascular;  Laterality: N/A;  . CARDIOVERSION N/A 11/04/2013   Procedure: CARDIOVERSION;  Surgeon: Larey Dresser, MD;  Location: [place];  Service: Cardiovascular;  Laterality: N/A;  . CARDIOVERSION N/A  06/04/2014   Procedure: CARDIOVERSION;  Surgeon: Jolaine Artist, MD;  Location: [facility] ENDOSCOPY;  Service: Cardiovascular;  Laterality: N/A;  . CARDIOVERSION N/A 03/02/2015   Procedure: CARDIOVERSION;  Surgeon: Jolaine Artist, MD;  Location: [facility] ENDOSCOPY;  Service: Cardiovascular;  Laterality: N/A;  . CARDIOVERSION N/A 01/19/2016   Procedure: CARDIOVERSION;  Surgeon: Larey Dresser, MD;  Location: [place];  Service: Cardiovascular;  Laterality: N/A;  . CAROTID ENDARTERECTOMY Left ~ 2007  . CATARACT EXTRACTION W/ INTRAOCULAR LENS  IMPLANT, BILATERAL Bilateral 2000  . CHOLECYSTECTOMY  12/15/?2010  . COLONOSCOPY WITH PROPOFOL N/A 03/27/202018   Procedure: COLONOSCOPY WITH  PROPOFOL;  Surgeon: Ronald Lobo, MD;  Location: [place];  Service: Endoscopy;  Laterality: N/A;  . CORONARY ANGIOPLASTY  1992  . CORONARY ANGIOPLASTY WITH STENT PLACEMENT     "last one was 11/2012  (05/25/2013)  . CYSTOSCOPY WITH RETROGRADE PYELOGRAM, URETEROSCOPY AND STENT PLACEMENT Left 08/09/2014   Procedure: CYSTOSCOPY WITH RETROGRADE PYELOGRAM, URETEROSCOPY AND STENT PLACEMENT;  Surgeon: Bernestine Amass, MD;  Location[facility];  Service: Urology;  Laterality: Left;  . CYSTOSCOPY WITH RETROGRADE PYELOGRAM, URETEROSCOPY AND STENT PLACEMENT Left 09/01/2014   Procedure: CYSTOSCOPY WITH URETEROSCOPY AND STENT REMOVAL;  Surgeon: Bernestine Amass, MD;  Location: [facility];  Service: Urology;  Laterality: Left;  . CYSTOSCOPY WITH RETROGRADE PYELOGRAM, URETEROSCOPY AND STENT PLACEMENT Left 09/20/2014   Procedure: CYSTOSCOPY WITH RETROGRADE PYELOGRAM, URETEROSCOPY AND STENT PLACEMENT, stone removal;  Surgeon: Bernestine Amass, MD;  Location[facility];  Service: Urology;  Laterality: Left;  . DOPPLER ECHOCARDIOGRAPHY  2011  . ELBOW ARTHROSCOPY Right 1990's  . ELECTROPHYSIOLOGIC STUDY N/A 07/19/2016   Procedure: Cardioversion;  Surgeon: Evans Lance, MD;  Location: [place] CV LAB;  Service: Cardiovascular;  Laterality: N/A;  . ENDARTERECTOMY Right 01/13/2016   Procedure: RIGHT CAROTID ARTERY ENDARTERECTOMY;  Surgeon: Mal Misty, MD;  Location: [place];  Service: Vascular;  Laterality: Right;  . ENTEROSCOPY Left 02/23/2017   Procedure: ENTEROSCOPY;  Surgeon: Ronald Lobo, MD;  Location: [facility] ENDOSCOPY;  Service: Endoscopy;  Laterality: Left;  . ENTEROSCOPY N/A 10/17/2017   Procedure: ENTEROSCOPY;  Surgeon: Yetta Flock, MD;  Location: [facility] ENDOSCOPY;  Service: Gastroenterology;  Laterality: N/A;  LVAD pt.    . ESOPHAGOGASTRODUODENOSCOPY N/A 02/15/2015   Procedure: ESOPHAGOGASTRODUODENOSCOPY (EGD);  Surgeon: Carol Ada, MD;  Location: [facility] ENDOSCOPY;  Service: Endoscopy;  Laterality: N/A;  LVAD  .  EYE SURGERY     VITRECTOMY  LEFT 05/2012  . FEMORAL-POPLITEAL BYPASS GRAFT Right 2011  . FEMORAL-POPLITEAL BYPASS GRAFT Left 05/2002   Archie Endo 05/06/2002 (05/25/2013)  . HEEL SPUR SURGERY Right 1990's  . HOLMIUM LASER APPLICATION Left 31/51/7616   Procedure: HOLMIUM LASER APPLICATION;  Surgeon: Bernestine Amass, MD;  [facility];  Service: Urology;  Laterality: Left;  . IMPLANTABLE CARDIOVERTER DEFIBRILLATOR GENERATOR CHANGE N/A 05/25/2013   Procedure: IMPLANTABLE CARDIOVERTER DEFIBRILLATOR GENERATOR CHANGE;  Surgeon: Deboraha Sprang, MD;  Location: [facility] CATH LAB;  Service: Cardiovascular;  Laterality: N/A;  . INSERTION OF IMPLANTABLE LEFT VENTRICULAR ASSIST DEVICE N/A 10/19/2013   Procedure: INSERTION OF IMPLANTABLE LEFT VENTRICULAR ASSIST DEVICE;  Surgeon: Gaye Pollack, MD;  Location: [place];  Service: Open Heart Surgery;  Laterality: N/A;  CIRC ARREST  NITRIC OXIDE  MEDTRONIC ICD  . INTRAOPERATIVE TRANSESOPHAGEAL ECHOCARDIOGRAM N/A 10/19/2013   Procedure: INTRAOPERATIVE TRANSESOPHAGEAL ECHOCARDIOGRAM;  Surgeon: Gaye Pollack, MD;  Location: [facility];  Service: Open Heart Surgery;  Laterality: N/A;  . KNEE ARTHROSCOPY Bilateral 1990's  .  LEAD REVISION N/A 06/05/2013   Procedure: LEAD REVISION;  Surgeon: Thompson Grayer, MD;  Location: [facility] CATH LAB;  Service: Cardiovascular;  Laterality: N/A;  . LEFT HEART CATHETERIZATION WITH CORONARY ANGIOGRAM N/A 11/24/2012   Procedure: LEFT HEART CATHETERIZATION WITH CORONARY ANGIOGRAM;  Surgeon: Peter M Martinique, MD;  Location: [facility] CATH LAB;  Service: Cardiovascular;  Laterality: N/A;  . LIPOMA EXCISION Left 2013   "near ear" (05/25/2013)  . [place]; 2000's  . PATCH ANGIOPLASTY Right 01/13/2016   Procedure: WITH  PATCH ANGIOPLASTY;  Surgeon: Mal Misty, MD;  Location: [place];  Service: Vascular;  Laterality: Right;  . RENAL ARTERY BYPASS Bilateral 2003  . REVASCULARIZATION / IN-SITU GRAFT LEG    . RIGHT HEART CATH N/A 11/29/2017   Procedure:  RIGHT HEART CATH;  Surgeon: Jolaine Artist, MD;  Location: [place] CV LAB;  Service: Cardiovascular;  Laterality: N/A;  . RIGHT HEART CATHETERIZATION N/A 09/17/2013   Procedure: RIGHT HEART CATH;  Surgeon: Jolaine Artist, MD;  Location: [facility] CATH LAB;  Service: Cardiovascular;  Laterality: N/A;  . RIGHT HEART CATHETERIZATION N/A 10/14/2013   Procedure: RIGHT HEART CATH;  Surgeon: Jolaine Artist, MD;  Location: [facility] CATH LAB;  Service: Cardiovascular;  Laterality: N/A;  . RIGHT HEART CATHETERIZATION N/A 11/16/2013   Procedure: RIGHT HEART CATH;  Surgeon: Jolaine Artist, MD;  Location: [facility] CATH LAB;  Service: Cardiovascular;  Laterality: N/A;  . RIGHT HEART CATHETERIZATION N/A 07/13/2014   Procedure: RIGHT HEART CATH;  Surgeon: Jolaine Artist, MD;  Location: [facility] CATH LAB;  Service: Cardiovascular;  Laterality: N/A;  . TEE WITHOUT CARDIOVERSION N/A 11/04/2013   Procedure: TRANSESOPHAGEAL ECHOCARDIOGRAM (TEE);  Surgeon: Larey Dresser, MD;  Location: [place];  Service: Cardiovascular;  Laterality: N/A;  . TEE WITHOUT CARDIOVERSION N/A 03/02/2015   Procedure: TRANSESOPHAGEAL ECHOCARDIOGRAM (TEE);  Surgeon: Jolaine Artist, MD;  Location: [facility] ENDOSCOPY;  Service: Cardiovascular;  Laterality: N/A;  . TEE WITHOUT CARDIOVERSION N/A 01/19/2016   Procedure: TRANSESOPHAGEAL ECHOCARDIOGRAM (TEE);  Surgeon: Larey Dresser, MD;  Location: [place];  Service: Cardiovascular;  Laterality: N/A;  . TONSILLECTOMY  1946  . TUMOR EXCISION Left 1960's   "fatty tumor" (05/25/2013)  . V-TACH ABLATION N/A 09/12/2011   Procedure: V-TACH ABLATION;  Surgeon: Evans Lance, MD;  Location: [facility] CATH LAB;  Service: Cardiovascular;  Laterality: N/A;  . V-TACH ABLATION N/A 06/08/2013   Procedure: V-TACH ABLATION;  Surgeon: Evans Lance, MD;  Location: [facility] CATH LAB;  Service: Cardiovascular;  Laterality: N/A;    Home Medications:  Medications Prior to Admission  Medication Sig Dispense Refill Last Dose   . amiodarone (PACERONE) 200 MG tablet Take 200 mg by mouth daily.   11/28/2017 at Unknown time  . atorvastatin (LIPITOR) 80 MG tablet TAKE 1 TABLET DAILY AT 6 PM (Patient taking differently: TAKE 1 TABLET (80 mg) DAILY in the morning) 30 tablet 11 11/28/2017 at Unknown time  . docusate sodium (COLACE) 100 MG capsule Take 200 mg by mouth every morning.    11/28/2017 at Unknown time  . levothyroxine (SYNTHROID, LEVOTHROID) 150 MCG tablet Take 1 tablet (150 mcg total) by mouth daily before breakfast. 90 tablet 3 11/29/2017 at 0300  . metoprolol succinate (TOPROL-XL) 25 MG 24 hr tablet Take 0.5 tablets (12.5 mg total) by mouth daily. 15 tablet 6 11/28/2017 at Unknown time  . Multiple Vitamins-Minerals (ICAPS PO) Take 1 tablet by mouth 2 (two) times daily.   11/29/2017 at 0300  .  pantoprazole (PROTONIX) 40 MG tablet Take 1 tablet (40 mg total) by mouth 2 (two) times daily. 180 tablet 6 11/28/2017 at Unknown time  . ranolazine (RANEXA) 500 MG 12 hr tablet Take 1 tablet (500 mg total) by mouth 2 (two) times daily. 60 tablet 6 11/28/2017 at Unknown time  . tamsulosin (FLOMAX) 0.4 MG CAPS capsule Take 1 capsule (0.4 mg total) by mouth daily. 30 capsule 3 11/28/2017 at Unknown time  . warfarin (COUMADIN) 2 MG tablet Take 2-4 mg by mouth daily. Take 2 mg (1 tablet) daily except 4 mg (2 tablets) on Tues   11/28/2017 at Unknown time  . zinc gluconate 50 MG tablet Take 50 mg by mouth 2 (two) times daily.    11/28/2017 at Unknown time  . furosemide (LASIX) 20 MG tablet Take 20 mg by mouth daily as needed for fluid.   More than a month at Unknown time  . nitroGLYCERIN (NITROSTAT) 0.4 MG SL tablet Place 0.4 mg under the tongue every 5 (five) minutes as needed for chest pain.   Unknown at Unknown time   Allergies:  Allergies  Allergen Reactions  . Demerol [Meperidine] Other (See Comments)    Paralysis. Could only move eyes.     Family History  Problem Relation Age of Onset  . Heart attack Mother   . Coronary artery  disease Mother   . Cancer Mother   . Heart disease Mother   . Hyperlipidemia Mother   . Hypertension Mother   . Coronary artery disease Father   . Stroke Father   . Cancer Father   . Heart disease Father        before age 38  . Hyperlipidemia Father   . Hypertension Father   . Heart attack Father   . Coronary artery disease Unknown    Social History:  reports that he quit smoking about 16 years ago. His smoking use included cigarettes. He has a 18.50 pack-year smoking history. he has never used smokeless tobacco. He reports that he drinks alcohol. He reports that he does not use drugs.  ROS: A complete review of systems was performed.  All systems are negative except for pertinent findings as noted. ROS   Physical Exam:  Vital signs in last 24 hours: Temp:  [97.6 F (36.4 C)-98.5 F (36.9 C)] 98.2 F (36.8 C) (01/30 0838) Pulse Rate:  [65-85] 78 (01/30 0838) Resp:  [12-20] 18 (01/30 0838) BP: (80-102)/(64-81) 89/76 (01/30 0808) SpO2:  [93 %-97 %] 97 % (01/30 0435) Weight:  [89.8 kg (197 lb 14.4 oz)] 89.8 kg (197 lb 14.4 oz) (01/30 0435) General:  Alert and oriented, No acute distress HEENT: Normocephalic, atraumatic Neck: No JVD or lymphadenopathy Cardiovascular: has LVAD Lungs: Regular rate and effort Abdomen: Soft, nontender, nondistended, no abdominal masses Back: No CVA tenderness Extremities: No edema Neurologic: Grossly intact  Laboratory Data:  Results for orders placed or performed during the hospital encounter of 11/29/17 (from the past 24 hour(s))  Prepare RBC     Status: None   Collection Time: 12/03/17 10:11 AM  Result Value Ref Range   Order Confirmation ORDER PROCESSED BY BLOOD BANK   Type and screen [place]     Status: None   Collection Time: 12/03/17 10:20 AM  Result Value Ref Range   ABO/RH(D) O POS    Antibody Screen NEG    Sample Expiration 12/06/2017    Unit Number [id number]    Blood Component Type RED CELLS,LR  Unit division 00    Status of Unit ISSUED,FINAL    Transfusion Status OK TO TRANSFUSE    Crossmatch Result Compatible   Glucose, capillary     Status: Abnormal   Collection Time: 12/03/17  9:15 PM  Result Value Ref Range   Glucose-Capillary 134 (H) 65 - 99 mg/dL   Comment 1 Notify RN    Comment 2 Document in Chart   Heparin level (unfractionated)     Status: None   Collection Time: 12/04/17  2:38 AM  Result Value Ref Range   Heparin Unfractionated 0.46 0.30 - 0.70 IU/mL  Basic metabolic panel     Status: Abnormal   Collection Time: 12/04/17  2:38 AM  Result Value Ref Range   Sodium 134 (L) 135 - 145 mmol/L   Potassium 4.1 3.5 - 5.1 mmol/L   Chloride 105 101 - 111 mmol/L   CO2 21 (L) 22 - 32 mmol/L   Glucose, Bld 110 (H) 65 - 99 mg/dL   BUN 15 6 - 20 mg/dL   Creatinine, Ser 1.33 (H) 0.61 - 1.24 mg/dL   Calcium 8.3 (L) 8.9 - 10.3 mg/dL   GFR calc non Af [name] 50 (L) >60 mL/min   GFR calc Af [name] 57 (L) >60 mL/min   Anion gap 8 5 - 15  CBC     Status: Abnormal   Collection Time: 12/04/17  2:38 AM  Result Value Ref Range   WBC 5.5 4.0 - 10.5 K/uL   RBC 3.17 (L) 4.22 - 5.81 MIL/uL   Hemoglobin 8.7 (L) 13.0 - 17.0 g/dL   HCT 27.6 (L) 39.0 - 52.0 %   MCV 87.1 78.0 - 100.0 fL   MCH 27.4 26.0 - 34.0 pg   MCHC 31.5 30.0 - 36.0 g/dL   RDW 17.7 (H) 11.5 - 15.5 %   Platelets 193 150 - 400 K/uL  Lactate dehydrogenase     Status: Abnormal   Collection Time: 12/04/17  2:38 AM  Result Value Ref Range   LDH 269 (H) 98 - 192 U/L  Protime-INR     Status: Abnormal   Collection Time: 12/04/17  2:38 AM  Result Value Ref Range   Prothrombin Time 17.8 (H) 11.4 - 15.2 seconds   INR 1.48    *Note: Due to a large number of results and/or encounters for the requested time period, some results have not been displayed. A complete set of results can be found in Results Review.   Recent Results (from the past 240 hour(s))  MRSA PCR Screening     Status: None   Collection Time: 11/29/17  5:47 PM   Result Value Ref Range Status   MRSA by PCR NEGATIVE NEGATIVE Final    Comment:        The GeneXpert MRSA Assay (FDA approved for NASAL specimens only), is one component of a comprehensive MRSA colonization surveillance program. It is not intended to diagnose MRSA infection nor to guide or monitor treatment for MRSA infections.   Culture, Urine     Status: Abnormal   Collection Time: 12/02/17  1:30 PM  Result Value Ref Range Status   Specimen Description URINE, CLEAN CATCH  Final   Special Requests NONE  Final   Culture <10,000 COLONIES/mL INSIGNIFICANT GROWTH (A)  Final   Report Status 12/03/2017 FINAL  Final   Creatinine: Recent Labs    11/30/17 0658 12/01/17 0439 12/02/17 0500 12/03/17 0255 12/04/17 0238  CREATININE 1.33* 1.65* 1.38* 1.46* 1.33*   CT:  personally reviewed IMPRESSION: Stable proximal left ureteral stone without significant hydronephrosis. Stable nonobstructing left renal stone is also again seen.  Bilateral renal cystic change stable from the prior study.  Increasing filling defect within the urinary bladder suspicious for an underlying mass. Although this may simply represent a hematoma, urologic consultation and direct visualization is recommended.  Previously seen left lower lobe nodule is not well appreciated on this exam.  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 Prior bypass grafting is noted as well.  Impression/Assessment:  Gross hematuria Left ureteral calculus Filling defect in the bladder concerning for possible bladder mass versus clot  Plan:  Given a hematuria, we will complete a full hematuria workup including cystoscopy with possible transurethral resection of bladder tumor, right retrograde pyelogram with possible interventions including ureteroscopy with biopsy, lithotripsy, ureteral stent. We will also perform a left ureteroscopy with laser lithotripsy and ureteral stent placement given the  presence of a left ureteral calculus. He understands the risk including but not limited to bleeding, infection, injury to surrounding structures, need for additional procedures. The LVAD team will be present for the surgery.  Marton Redwood, III 12/04/2017, 10:10 AM

## 2017-12-04 NOTE — Progress Notes (Signed)
VAD Coordinator Procedure Note:   Patient underwent cystoscopy and ureteroscopy per Dr. Gloriann Loan in [place] 14.[name] Hemodynamics and VAD parameters monitored by anesthesia and myself throughout the procedure. MAPs were obtained with manual BP cuff on left forearm with dopplering of the right brachial artery as needed with manual cuff.     Doppler Auto cuff(MAP): HR: Sat: Flow: PI: Power:   Speed:        Pre-procedure:  11:00 86  95/76 (82)  69 96 5.0 6.8 4.9      8800    Intra-op:  11:30   114/92 (101)  70 96 4.0 8.1 4.5 11:45   118/100 (107)  92 99 4.1 7.9 4.6 11:55   ---   66 96 4.8 4.8 4.9 12:00 78  ---   74 96 4.5 6.5 4.8 12:05   108/90 (98)  77 96 4.3 7.0 4.7 12:15   112/96 (104)  76 99 3.9 7.9 4.5 12:30   113/96 (102)  73 96 4.3 7.4 4.7 12:40   101/97 (93)  78 96 4.4 6.8 4.7 12:45   88/74 (81)  70 96 4.7 5.7 4.9 12:50 72  ---   67 96 4.5 5.5 4.8 13:00   85/74 (80)  64 96 4.4 5.8 4.7 13:10   87/74   61 97 4.3 5.3 4.7 13:15   85/74 (80)  62 96 4.3 5.5 4.7 13:30   90/80 (86)  63 96 4.2 5.7 4.7 13:45   96/84 (90)  67 97 3.9 6.6 4.4     PACU:  14:00   94/75 (83)  67 99 4.2 6.3 4.7 14:15   80/70 (75)  65 98 4.4 5.6 4.8 14:30   85/69 (76)  60 97 4.5 6.1 4.7   Patient tolerated the procedure well. Neo and PC x 1 during case for BP support. Dr. Gloriann Loan finished procedure and went to personally update family member [name]).   Patient Disposition: 2C09

## 2017-12-04 NOTE — Progress Notes (Deleted)
Paged [facility] for ureteroscopy orders- kept NPO since midnight and has had CHG bath.

## 2017-12-04 NOTE — Progress Notes (Signed)
Called to the room by patient because his nose was bleeding. Pressure applied and the bleeding stopped. Patient instructed to call me back for anymore complications.

## 2017-12-04 NOTE — Progress Notes (Signed)
Advanced Heart Failure VAD Team Note  Subjective:   Remains on milrinone 0.2 mcg. Received 1UPRBCs. Hgb 8.1>8.7.   Started Rocephin 12/03/17   CT Renal Stone. Stable L ureteral stone non obstructing. Increasing filling defect within the urinary bladder suspicious for an underlying mass. Although this may simply represent a hematoma,urologic consultation and direct visualization is recommended.  Feels weak. Ongoing hematuria. Denies SOB.  CT scan yesterday showed persistent ureteral stone and filling defect in bladder (mass vs hematoma) - Personally reviewed.  Got 1u RBCs yesterday. Hgb 8.7 this am. For OR at 11am  INR 1.4  LDH 269  LVAD INTERROGATION:  HeartMate II LVAD:  Flow 4.6  liters/min, speed 8800, power 5.0, PI 5.9   Rare PI events.   Objective:    Vital Signs:   Temp:  [97.6 F (36.4 C)-98.5 F (36.9 C)] 97.6 F (36.4 C) (01/30 0435) Pulse Rate:  [65-85] 70 (01/30 0435) Resp:  [12-20] 12 (01/30 0435) BP: (80-102)/(64-81) 91/77 (01/30 0435) SpO2:  [93 %-97 %] 97 % (01/30 0435) Weight:  [197 lb 14.4 oz (89.8 kg)] 197 lb 14.4 oz (89.8 kg) (01/30 0435) Last BM Date: 12/02/17 Mean arterial Pressure 70-80s   Intake/Output:   Intake/Output Summary (Last 24 hours) at 12/04/2017 0803 Last data filed at 12/04/2017 0600 Gross per 24 hour  Intake 1762.59 ml  Output 1350 ml  Net 412.59 ml     Physical Exam    Physical Exam: GENERAL: NAD. Ple. Weak . Sitting on the side of the bed.  HEENT: normal  NECK: Supple, JVP 7-8   .  2+ bilaterally, no bruits.  No lymphadenopathy or thyromegaly appreciated.   CARDIAC:  Mechanical heart sounds with LVAD hum present.  LUNGS:  Clear to auscultation bilaterally.  ABDOMEN:  Soft, round, nontender, positive bowel sounds x4.     LVAD exit site:  Dressing dry and intact.  No erythema or drainage.  Stabilization device present and accurately applied.  Driveline dressing is being changed daily per sterile technique. EXTREMITIES:  Warm and  dry, no cyanosis, clubbing, rash or edema  NEUROLOGIC:  Alert and oriented x 4.  Gait steady.  No aphasia.  No dysarthria.  Affect pleasant.       Telemetry   NSR 60-70s  Personally reviewed   EKG    No new tracings.    Labs   Basic Metabolic Panel: Recent Labs  Lab 11/30/17 0658 12/01/17 0439 12/02/17 0500 12/03/17 0255 12/04/17 0238  NA 138 136 135 135 134*  K 4.6 3.8 4.4 4.1 4.1  CL 109 105 107 106 105  CO2 19* 21* 22 20* 21*  GLUCOSE 113* 112* 111* 110* 110*  BUN 23* 24* 23* 23* 15  CREATININE 1.33* 1.65* 1.38* 1.46* 1.33*  CALCIUM 8.5* 8.3* 8.4* 8.4* 8.3*  MG  --  1.9  --   --   --     Liver Function Tests: No results for input(s): AST, ALT, ALKPHOS, BILITOT, PROT, ALBUMIN in the last 168 hours. No results for input(s): LIPASE, AMYLASE in the last 168 hours. No results for input(s): AMMONIA in the last 168 hours.  CBC: Recent Labs  Lab 11/30/17 0658 12/01/17 0439 12/02/17 0500 12/03/17 0255 12/04/17 0238  WBC 5.7 5.3 5.0 5.3 5.5  HGB 9.2* 8.3* 8.8* 8.1* 8.7*  HCT 29.6* 27.2* 27.6* 25.8* 27.6*  MCV 90.0 88.6 88.2 88.7 87.1  PLT 180 179 174 174 193    INR: Recent Labs  Lab 11/30/17 1802 12/01/17 0439  12/02/17 0500 12/03/17 0255 12/04/17 0238  INR 1.89 1.90 1.75 1.58 1.48    Other results:  EKG:    Imaging   Dg Chest Port 1 View  Result Date: 12/03/2017 CLINICAL DATA:  Preoperative for lithotripsy on 12/04/2017 EXAM: PORTABLE CHEST 1 VIEW COMPARISON:  08/17/2017 FINDINGS: Postoperative changes in the mediastinum. Cardiac pacemaker. Left ventricular assist device. Shallow inspiration. Cardiac enlargement. Increasing interstitial pattern to the lungs since previous study suggesting increasing pulmonary edema. No focal consolidation. No blunting of costophrenic angles. No pneumothorax. Calcification of the aorta. IMPRESSION: Cardiac enlargement with increasing interstitial pattern to the lungs suggesting increasing interstitial edema. No focal  consolidation. Aortic atherosclerosis. Electronically Signed   By: Lucienne Capers M.D.   On: 12/03/2017 23:14   Ct Renal Stone Study  Result Date: 12/03/2017 CLINICAL DATA:  Bilateral flank pain and hematuria EXAM: CT ABDOMEN AND PELVIS WITHOUT CONTRAST TECHNIQUE: Multidetector CT imaging of the abdomen and pelvis was performed following the standard protocol without IV contrast. COMPARISON:  10/18/2017 FINDINGS: Lower chest: Chronic fibrotic changes are noted in the lung bases bilaterally. Small left pleural effusion is seen slightly greater than that noted on the prior exam. Left ventricular assist device is noted. Defibrillator is noted as well. The previously seen tiny left lower lobe nodule is not as well appreciated on the current exam. Hepatobiliary: No focal liver abnormality is seen. Status post cholecystectomy. No biliary dilatation. Pancreas: Unremarkable. No pancreatic ductal dilatation or surrounding inflammatory changes. Spleen: Normal in size without focal abnormality. Adrenals/Urinary Tract: The adrenal glands are within normal limits. Bilateral cystic changes are noted stable from the prior exam. The largest of these on the left again demonstrates some mural calcification. This is also stable from the prior exam. Stable tiny nonobstructing left renal stone is noted. No significant hydronephrotic changes are noted although a persistent 6 mm stone is noted in the proximal left ureter stable from the prior exam. No right renal calculi are noted although some fullness of the distal right ureter is noted similar to that seen on the prior exam. This may be related to the underlying bladder abnormality. The bladder is partially distended. There is a filling defect identified within the bladder which now measures approximately 4.1 x 2.3 cm. It previously measured 3.3 cm in greatest length. Stomach/Bowel: The appendix is not well visualized although no inflammatory changes to suggest appendicitis are  noted. No obstructive or inflammatory changes are noted. Diverticulosis without diverticulitis is seen. Vascular/Lymphatic: Abdominal aortic aneurysm is again identified in the juxtarenal position. It again measures approximately 4.7 cm in greatest dimension and is stable in appearance. Changes consistent with distal aortobifemoral bypass grafting is noted. Bilateral renal artery bypasses are noted. Reproductive: Prostate is unremarkable. Other: No abdominal wall hernia or abnormality. No abdominopelvic ascites. Musculoskeletal: L4 compression deformity is again noted. Postoperative fusion from L3 to S L5 is seen. IMPRESSION: Stable proximal left ureteral stone without significant hydronephrosis. Stable nonobstructing left renal stone is also again seen. Bilateral renal cystic change stable from the prior study. Increasing filling defect within the urinary bladder suspicious for an underlying mass. Although this may simply represent a hematoma, urologic consultation and direct visualization is recommended. Previously seen left lower lobe nodule is not well appreciated on this exam. 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 Prior bypass grafting is noted as well. Electronically Signed   By: Inez Catalina M.D.   On: 12/03/2017 11:14     Medications:     Scheduled Medications: .  amiodarone  200 mg Oral Daily  . atorvastatin  80 mg Oral q1800  . docusate sodium  200 mg Oral Daily  . levothyroxine  150 mcg Oral QAC breakfast  . pantoprazole  40 mg Oral Daily  . ranolazine  500 mg Oral BID  . sildenafil  20 mg Oral TID  . sodium chloride flush  3 mL Intravenous Q12H  . tamsulosin  0.4 mg Oral Daily    Infusions: . sodium chloride    .  ceFAZolin (ANCEF) IV    . cefTRIAXone (ROCEPHIN)  IV Stopped (12/03/17 1210)  . heparin 1,450 Units/hr (12/04/17 0600)  . milrinone 0.2 mcg/kg/min (12/04/17 0600)    PRN Medications: sodium chloride, acetaminophen, acetaminophen,  ondansetron (ZOFRAN) IV, ondansetron (ZOFRAN) IV, sodium chloride flush   VAD EVENTS   GU Event 09/01/14 had impacted ureteral stone and underwent cystoscopy and flexible ureteroscopy with lithotripsy and basket extraction.   09/2015 Hematuria and chronic nephrolithiasis. Renal US showed mild hyronephrosis and bilateral renal cysts. Dr. Risa Grill took back for repeat lithotripsy and stone extraction by ureteroscopy and J stent placement. 12/18 Recurrent hematuria. 7mm left ureteral stone. 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  4/2-18 - Episode of melena. Hgb 10.9-> 8.7. EGD/colonoscopy negative 12/18 - Melena with syncope  Hgb 6.5. Transfused 5u. 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  01/2016 R CEA. He was started on Plavix.  01/2016 Lumbar fusion L3-L5 with pedicle screws posterolateral arthrodesis with BMP, allograft  Patient Profile  ALIOUNE HODGKIN is a 77 y.o. 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 Required short term milrinone.   Admitted 1/25 for elective ureteroscopy due to R ureteral stone.  Still with occasional hematuria. Continues with fatigue. RHC 1/25 showed low output due to RV failure (see below). Milrinone initiated at 0.125.  Assessment/Plan:    1. 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 Struggling with progressive NYHA III symptoms.  [place] 1/25 showed primarily RHF with low output. Milrinone 0.2 mcg.  - No real symptomatic improvement with milrinone.Will continue for now but then try to wean prior to d/c. No PICC with pending surgery. Will need PICC down the road if milrinone is not weaned.  - Volume status stable.    - Continue  Sildenafil 20 mg TID with RV failure.  - Continue VAD speed at 8800. VAD interrogated personally. Parameters stable.  2. Recurrent renal stone with ongoing hematuria and UTI: Left ureteral stone in 12/18. - Last dose of coumadin 1-24/2019 - 12/03/2017. CT with L stone and possible mass versus hematoma.  - Plan ureteroscopy today.  - Off heparin for now.  -Started rocephin 12/03/17. Ucx negative.  3. 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 Continue coumadin and plavix (with h/o CVA). Admitted 4/18 with recurrent GIB. Transfused 2u. EGD/colon negative. Admitted 12/18, Hgb 6.5, Transfused 5u. EGD with 2 AVMs treated with APC Hgb 8.7  4.  Atrial arrhythmias: Paroxysmal atrial fibrillation, atrial tachycardia.  S/p DC-CV 06/04/14, DC-CV 03/02/15 and 01/19/16.  - Remains in NSR.  - Continue amiodarone.  5. HTN:  -stable.  6. LVAD:  - VAD interrogated personally. Parameters  stable. 7. AKI on CKD, stage III:  - Creatinine 1.33  8. 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  - Continue atorvastatin. (also warfarin INR goal 2-2.5). S/p R CEA 01/13/16. Plavix stopped 12/18 due to recurrent GIB 9. H/o amio-induced hypothyroidism: Amio restarted due to recurrent atrial arrhythmias and VT.  - Continue home levothyroxine dose.  10. VT:  - Underwent DC-CV 9/17. Repeat episode of slow VT 08/17/17 requiring DC-CV.  - Continue amio 200 daily - Goal K > 4.0 Mg > 2.0  I reviewed the LVAD parameters from today, and compared the results to the patient's prior recorded data.  No programming changes were made.  The LVAD is functioning within specified parameters.  The patient performs LVAD self-test daily.  LVAD interrogation was negative for any significant power changes, alarms or PI events/speed drops.  LVAD equipment check completed and is in good working order.  Back-up equipment present.   LVAD education done on emergency procedures and precautions and reviewed exit site care.  Length of  Stay: 5  Amy Clegg, NP 12/04/2017, 8:03 AM  VAD Team --- VAD ISSUES ONLY--- Pager [phone] (7am - 7am)  Advanced Heart Failure Team  Pager [phone] (M-F; 7a - 4p)  Please contact [facility] for night-coverage after hours (4p -7a ) and weekends on [email or web address]  Patient seen and examined with Darrick Grinder, NP. We discussed all aspects of the encounter. I agree with the assessment and plan as stated above.   Continues with gross hematuria. CT reviewed personally and d/w Urology. For ureteroscopy today. Stop heparin now. Has not gotten much symptomatic relief from milrinone. Would wean off after OR. Hgb drifting back down. Will likely need another unit RBCs. Off lasix for now. Resume after OR. Continue abx for UTI. Cx < 10K insignificant growth. VAD interrogated personally. Parameters stable.   Glori Bickers, MD  11:13 AM

## 2017-12-04 NOTE — Op Note (Signed)
Operative Note  Preoperative diagnosis:  1.  Left ureteral calculus 2.  Gross hematuria 3.  Possible bladder mass  Postoperative diagnosis: 1.  Left ureteral calculus 2.  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  Cystoscopy with TURBT-large 2.  Left ureteroscopy with laser lithotripsy, retrograde pyelogram, left ureteral stent  Surgeon: Link Snuffer, MD  Assistants: None  Anesthesia: General  Complications: None immediate  EBL: 200 cc  Specimens: 1.  Bladder tumor  Drains/Catheters: 1.  6 x 26 double-J ureteral stent on the left 2.  24 French 3-way Foley catheter  Intraoperative findings: 1.  Normal urethra 2.  There was a large at least 6 cm papillary bladder tumor extending from the midline of the trigone up the right lateral wall.  The right ureteral orifice was unable to be visualized due to overlying tumor.  The ureteral orifice was likely resected during the TURBT. 3.  Left retrograde pyelogram revealed no significant filling defect. 4.  Left ureteroscopy revealed a 7 mm renal calculus likely representing the previously seen ureteral calculus.  This was fragmented to less than 1 mm fragments.  Indication: 77 year old male who experienced gross hematuria and was found on CT scan to have a 8 mm proximal left ureteral calculus.  He was also found to have hyperdense area in the bladder representing hematoma versus bladder tumor.  He was admitted several days ago to the cardiac heart failure service for management of his LVAD.  He stopped his heparin this morning.  A repeat CT scan redemonstrated the left ureteral calculus as well as interval enlargement of the hyper density in the bladder.  Description of procedure:  The patient was identified and consent was obtained.  The patient was taken to the operating room and placed in the supine position.  The patient was placed under general anesthesia.  The LVAD team was present.  Perioperative antibiotics were  administered.  The patient was placed in dorsal lithotomy.  Patient was prepped and draped in a standard sterile fashion and a timeout was performed.  A 21 French rigid cystoscope was advanced into the urethra and into the bladder.  A large bladder tumor was immediately encountered and therefore the scope was withdrawn.  I then advanced a 44 French resectoscope with the visual obturator in place into the bladder.  I exchange this for the resectoscope component.  Bladder blood clots were evacuated.  I then used bipolar cut settings to resect the tumor.  The tumor bed was fulgurated with bipolar coagulation settings.  I was able to grossly resect the entire mass.  There were 2-3 areas where there was a deep resection but no evidence of perforation.  I did resect down to muscle fibers in several areas.  Once the entire tumor appeared to be completely resected, I obtained hemostasis with electrocautery.  There is no area of obvious bleeding.  I drained the bladder and withdrew the scope.  I readvanced the rigid cystoscope into the bladder.  The right ureteral orifice was unable to be identified.  I cannulated the left ureteral orifice with a sensor wire and advanced that up to the kidney under fluoroscopic guidance.  I then advanced a semirigid ureteroscope alongside the wire up to the level of the renal pelvis and no stone was seen.  I shot a retrograde pyelogram through the scope with the findings noted above.  I passed a second sensor wire up into the kidney through the semirigid ureteroscope followed by withdrawal of the  scope.  I then advanced a 12 x 14 ureteral access sheath over the wire first passively dilated with the inner portion followed by advancement of the entire sheath.  This was able to be advanced 1/3-1/2 of the way up the ureter until it met resistance and I stopped.  I removed the inner sheath and the wire.  I was able to advance the single lumen flexible ureteroscope up to the kidney without  resistance.  The stone was encountered and fragmented with the laser fiber to less than 1 mm fragments.  I performed a complete pyeloscopy and no other stone fragments were seen.  I then withdrew the scope along with the access sheath visualizing the entire ureter upon removal.  There is no significant ureteral injury and also no ureteral calculi.  Under fluoroscopic guidance I advanced a 6 x 26 double-J ureteral stent over the wire and left the string in place.  I removed the wire and fluoroscopy confirmed proximal and distal placement.  I readvanced the resectoscope into the bladder and inspected the entire bladder.  There was no evidence of any active bleeding currently.  I then withdrew the scope and placed a 24 [place] 3-way hematuria catheter into the bladder and put 20 cc of sterile water into the catheter balloon.  This concluded the operation.  Patient tolerated procedure well and was stable postoperatively.  Plan: I asked the LVAD team to hold heparin as long as safely possible.  I would like to hold off on continuous bladder irrigation given that I had to resect deep in 2-3 areas and I would like to avoid the possibility of bladder perforation.  If he bleeds significantly, we may need to start CBI.  Hopefully with holding heparin, his urine will stay cleared up.  We will keep the Foley catheter in place for about a week.  At that point both the catheter and the stent will be removed together.  Pathology will be reviewed with the patient once available.

## 2017-12-05 ENCOUNTER — Encounter (HOSPITAL_COMMUNITY): Payer: Self-pay | Admitting: Urology

## 2017-12-05 LAB — TYPE AND SCREEN
ABO/RH(D): O POS
ANTIBODY SCREEN: NEGATIVE
UNIT DIVISION: 0
Unit division: 0

## 2017-12-05 LAB — BPAM RBC
BLOOD PRODUCT EXPIRATION DATE: 201902042359
BLOOD PRODUCT EXPIRATION DATE: 201902142359
ISSUE DATE / TIME: 201901291238
ISSUE DATE / TIME: 201901301145
UNIT TYPE AND RH: 5100
UNIT TYPE AND RH: 9500

## 2017-12-05 LAB — CBC WITH DIFFERENTIAL/PLATELET
Basophils Absolute: 0 10*3/uL (ref 0.0–0.1)
Basophils Relative: 0 %
Eosinophils Absolute: 0 10*3/uL (ref 0.0–0.7)
Eosinophils Relative: 0 %
HEMATOCRIT: 30.2 % — AB (ref 39.0–52.0)
HEMOGLOBIN: 9.4 g/dL — AB (ref 13.0–17.0)
LYMPHS ABS: 0.5 10*3/uL — AB (ref 0.7–4.0)
LYMPHS PCT: 4 %
MCH: 27.6 pg (ref 26.0–34.0)
MCHC: 31.1 g/dL (ref 30.0–36.0)
MCV: 88.8 fL (ref 78.0–100.0)
Monocytes Absolute: 1.1 10*3/uL — ABNORMAL HIGH (ref 0.1–1.0)
Monocytes Relative: 10 %
NEUTROS ABS: 10 10*3/uL — AB (ref 1.7–7.7)
NEUTROS PCT: 86 %
PLATELETS: 165 10*3/uL (ref 150–400)
RBC: 3.4 MIL/uL — AB (ref 4.22–5.81)
RDW: 17.6 % — ABNORMAL HIGH (ref 11.5–15.5)
WBC: 11.6 10*3/uL — AB (ref 4.0–10.5)

## 2017-12-05 LAB — BASIC METABOLIC PANEL
Anion gap: 9 (ref 5–15)
BUN: 24 mg/dL — ABNORMAL HIGH (ref 6–20)
CHLORIDE: 107 mmol/L (ref 101–111)
CO2: 19 mmol/L — AB (ref 22–32)
Calcium: 8.7 mg/dL — ABNORMAL LOW (ref 8.9–10.3)
Creatinine, Ser: 2.21 mg/dL — ABNORMAL HIGH (ref 0.61–1.24)
GFR calc Af Amer: 31 mL/min — ABNORMAL LOW (ref >60)
GFR, EST NON AFRICAN AMERICAN: 27 mL/min — AB (ref >60)
GLUCOSE: 145 mg/dL — AB (ref 65–99)
POTASSIUM: 4.6 mmol/L (ref 3.5–5.1)
Sodium: 135 mmol/L (ref 135–145)

## 2017-12-05 LAB — LACTATE DEHYDROGENASE: LDH: 285 U/L — AB (ref 98–192)

## 2017-12-05 LAB — MAGNESIUM: Magnesium: 2 mg/dL (ref 1.7–2.4)

## 2017-12-05 LAB — PROTIME-INR
INR: 1.33
Prothrombin Time: 16.3 seconds — ABNORMAL HIGH (ref 11.4–15.2)

## 2017-12-05 MED ORDER — WARFARIN - PHARMACIST DOSING INPATIENT
Freq: Every day | Status: DC
  Administered 2017-12-05 – 2017-12-09 (×5)

## 2017-12-05 MED ORDER — WARFARIN SODIUM 4 MG PO TABS
4.0000 mg | ORAL_TABLET | Freq: Once | ORAL | Status: AC
  Administered 2017-12-05: 4 mg via ORAL
  Filled 2017-12-05: qty 1

## 2017-12-05 NOTE — Progress Notes (Signed)
[place] on 80's on room air. Placed back on o2 2l [place].

## 2017-12-05 NOTE — Progress Notes (Signed)
Urology Inpatient Progress Report  Acute on chronic systolic (congestive) heart failure (HCC) [I50.23]  Procedure(s): CYSTOSCOPY WITH BILATERAL RETROGRADE PYELOGRAM, LEFT URETEROSCOPY AND STENT PLACEMENT HOLMIUM LASER APPLICATION TRANSURETHRAL RESECTION OF BLADDER TUMOR (TURBT)  1 Day Post-Op   Intv/Subj: No acute events overnight. Patient is without complaint. Urine remains red but thin.  Active Problems:   Acute on chronic systolic (congestive) heart failure (HCC)   Acute on chronic systolic heart failure (HCC)  Current Facility-Administered Medications  Medication Dose Route Frequency Provider Last Rate Last Dose  . 0.9 %  sodium chloride infusion  250 mL Intravenous PRN Bensimhon, Shaune Pascal, MD   Stopped at 12/04/17 1330  . 0.9 %  sodium chloride infusion   Intravenous Once Kyung Rudd, CRNA      . acetaminophen (TYLENOL) tablet 650 mg  650 mg Oral Q4H PRN Bensimhon, Shaune Pascal, MD      . acetaminophen (TYLENOL) tablet 650 mg  650 mg Oral Q4H PRN Clegg, Amy D, NP      . amiodarone (PACERONE) tablet 200 mg  200 mg Oral Daily Larey Dresser, MD   200 mg at 12/04/17 0946  . atorvastatin (LIPITOR) tablet 80 mg  80 mg Oral q1800 Larey Dresser, MD   80 mg at 12/04/17 1718  . cefTRIAXone (ROCEPHIN) 1 g in dextrose 5 % 50 mL IVPB  1 g Intravenous Q24H Bensimhon, Shaune Pascal, MD   Stopped at 12/04/17 1018  . docusate sodium (COLACE) capsule 200 mg  200 mg Oral Daily Larey Dresser, MD   200 mg at 12/04/17 0945  . lactated ringers infusion   Intravenous Continuous Effie Berkshire, MD 0 mL/hr at 12/04/17 1226    . levothyroxine (SYNTHROID, LEVOTHROID) tablet 150 mcg  150 mcg Oral QAC breakfast Einar Grad, RPH   150 mcg at 12/04/17 0840  . milrinone (PRIMACOR) 20 MG/100 ML (0.2 mg/mL) infusion  0.2 mcg/kg/min Intravenous Continuous Bensimhon, Shaune Pascal, MD 5.3 mL/hr at 12/05/17 0504 0.2 mcg/kg/min at 12/05/17 0504  . ondansetron (ZOFRAN) injection 4 mg  4 mg Intravenous Q6H PRN  Bensimhon, Shaune Pascal, MD      . ondansetron (ZOFRAN) injection 4 mg  4 mg Intravenous Q6H PRN Clegg, Amy D, NP      . pantoprazole (PROTONIX) EC tablet 40 mg  40 mg Oral Daily Larey Dresser, MD   40 mg at 12/04/17 0945  . ranolazine (RANEXA) 12 hr tablet 500 mg  500 mg Oral BID Larey Dresser, MD   500 mg at 12/04/17 2113  . sildenafil (REVATIO) tablet 20 mg  20 mg Oral TID Jolaine Artist, MD   20 mg at 12/04/17 2113  . sodium chloride flush (NS) 0.9 % injection 3 mL  3 mL Intravenous Q12H Bensimhon, Shaune Pascal, MD   3 mL at 12/04/17 2114  . sodium chloride flush (NS) 0.9 % injection 3 mL  3 mL Intravenous PRN Bensimhon, Shaune Pascal, MD      . tamsulosin (FLOMAX) capsule 0.4 mg  0.4 mg Oral Daily Larey Dresser, MD   0.4 mg at 12/04/17 0946     Objective: Vital: Vitals:   12/05/17 0300 12/05/17 0310 12/05/17 0333 12/05/17 0500  BP:  98/78 90/69 98/79   Pulse: 72  71 75  Resp: 16  20 11   Temp:   98.8 F (37.1 C)   TempSrc:   Oral   SpO2: 94%  93% 94%  Weight:    90.6 kg (  199 lb 11.2 oz)  Height:       I/Os: I/O last 3 completed shifts: In: 1690.4 [P.O.:390; I.V.:935.4; Blood:315; IV Piggyback:50] Out: 1500 [Urine:1350; Blood:150]  Physical Exam:  General: Patient is in no apparent distress Lungs: Normal respiratory effort, chest expands symmetrically. GI: The abdomen is soft and nontender without mass. Foley: urine red but thin  Ext: lower extremities symmetric  Lab Results: Recent Labs    12/03/17 0255 12/04/17 0238  WBC 5.3 5.5  HGB 8.1* 8.7*  HCT 25.8* 27.6*   Recent Labs    12/03/17 0255 12/04/17 0238  NA 135 134*  K 4.1 4.1  CL 106 105  CO2 20* 21*  GLUCOSE 110* 110*  BUN 23* 15  CREATININE 1.46* 1.33*  CALCIUM 8.4* 8.3*   Recent Labs    12/03/17 0255 12/04/17 0238 12/05/17 0241  INR 1.58 1.48 1.33   No results for input(s): LABURIN in the last 72 hours. Results for orders placed or performed during the hospital encounter of 11/29/17  MRSA  PCR Screening     Status: None   Collection Time: 11/29/17  5:47 PM  Result Value Ref Range Status   MRSA by PCR NEGATIVE NEGATIVE Final    Comment:        The GeneXpert MRSA Assay (FDA approved for NASAL specimens only), is one component of a comprehensive MRSA colonization surveillance program. It is not intended to diagnose MRSA infection nor to guide or monitor treatment for MRSA infections.   Culture, Urine     Status: Abnormal   Collection Time: 12/02/17  1:30 PM  Result Value Ref Range Status   Specimen Description URINE, CLEAN CATCH  Final   Special Requests NONE  Final   Culture <10,000 COLONIES/mL INSIGNIFICANT GROWTH (A)  Final   Report Status 12/03/2017 FINAL  Final   *Note: Due to a large number of results and/or encounters for the requested time period, some results have not been displayed. A complete set of results can be found in Results Review.    Studies/Results: Dg Retrograde Pyelogram  Result Date: 12/04/2017 CLINICAL DATA:  Ureteral calculus EXAM: INTRAOPERATIVE LEFT RETROGRADE UROGRAPHY TECHNIQUE: Images were obtained with the C-arm fluoroscopic device intraoperatively and submitted for interpretation post-operatively. Please see the procedural report for the amount of contrast and the fluoroscopy time utilized. COMPARISON:  None. FINDINGS: Images demonstrate placement of a left double-J ureteral stent. IMPRESSION: Left double-J ureteral stent. Electronically Signed   By: Marybelle Killings M.D.   On: 12/04/2017 14:09   Dg Chest Port 1 View  Result Date: 12/03/2017 CLINICAL DATA:  Preoperative for lithotripsy on 12/04/2017 EXAM: PORTABLE CHEST 1 VIEW COMPARISON:  08/17/2017 FINDINGS: Postoperative changes in the mediastinum. Cardiac pacemaker. Left ventricular assist device. Shallow inspiration. Cardiac enlargement. Increasing interstitial pattern to the lungs since previous study suggesting increasing pulmonary edema. No focal consolidation. No blunting of  costophrenic angles. No pneumothorax. Calcification of the aorta. IMPRESSION: Cardiac enlargement with increasing interstitial pattern to the lungs suggesting increasing interstitial edema. No focal consolidation. Aortic atherosclerosis. Electronically Signed   By: Lucienne Capers M.D.   On: 12/03/2017 23:14   Ct Renal Stone Study  Result Date: 12/03/2017 CLINICAL DATA:  Bilateral flank pain and hematuria EXAM: CT ABDOMEN AND PELVIS WITHOUT CONTRAST TECHNIQUE: Multidetector CT imaging of the abdomen and pelvis was performed following the standard protocol without IV contrast. COMPARISON:  10/18/2017 FINDINGS: Lower chest: Chronic fibrotic changes are noted in the lung bases bilaterally. Small left pleural effusion is seen  slightly greater than that noted on the prior exam. Left ventricular assist device is noted. Defibrillator is noted as well. The previously seen tiny left lower lobe nodule is not as well appreciated on the current exam. Hepatobiliary: No focal liver abnormality is seen. Status post cholecystectomy. No biliary dilatation. Pancreas: Unremarkable. No pancreatic ductal dilatation or surrounding inflammatory changes. Spleen: Normal in size without focal abnormality. Adrenals/Urinary Tract: The adrenal glands are within normal limits. Bilateral cystic changes are noted stable from the prior exam. The largest of these on the left again demonstrates some mural calcification. This is also stable from the prior exam. Stable tiny nonobstructing left renal stone is noted. No significant hydronephrotic changes are noted although a persistent 6 mm stone is noted in the proximal left ureter stable from the prior exam. No right renal calculi are noted although some fullness of the distal right ureter is noted similar to that seen on the prior exam. This may be related to the underlying bladder abnormality. The bladder is partially distended. There is a filling defect identified within the bladder which now  measures approximately 4.1 x 2.3 cm. It previously measured 3.3 cm in greatest length. Stomach/Bowel: The appendix is not well visualized although no inflammatory changes to suggest appendicitis are noted. No obstructive or inflammatory changes are noted. Diverticulosis without diverticulitis is seen. Vascular/Lymphatic: Abdominal aortic aneurysm is again identified in the juxtarenal position. It again measures approximately 4.7 cm in greatest dimension and is stable in appearance. Changes consistent with distal aortobifemoral bypass grafting is noted. Bilateral renal artery bypasses are noted. Reproductive: Prostate is unremarkable. Other: No abdominal wall hernia or abnormality. No abdominopelvic ascites. Musculoskeletal: L4 compression deformity is again noted. Postoperative fusion from L3 to S L5 is seen. IMPRESSION: Stable proximal left ureteral stone without significant hydronephrosis. Stable nonobstructing left renal stone is also again seen. Bilateral renal cystic change stable from the prior study. Increasing filling defect within the urinary bladder suspicious for an underlying mass. Although this may simply represent a hematoma, urologic consultation and direct visualization is recommended. Previously seen left lower lobe nodule is not well appreciated on this exam. 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 Prior bypass grafting is noted as well. Electronically Signed   By: Inez Catalina M.D.   On: 12/03/2017 11:14    Assessment: Hematuria Bladder tumor Left ureteral calculus Procedure(s): CYSTOSCOPY WITH left RETROGRADE PYELOGRAM, LEFT URETEROSCOPY AND STENT PLACEMENT HOLMIUM LASER APPLICATION TRANSURETHRAL RESECTION OF BLADDER TUMOR (TURBT), 1 Day Post-Op  doing well.  Plan: Continue foley and stent x 5-7 days Consider checking an H&H Continue to hold heparin as long as clinically safe to do so.    Link Snuffer, MD Urology 12/05/2017, 8:03 AM

## 2017-12-05 NOTE — Anesthesia Postprocedure Evaluation (Signed)
Anesthesia Post Note  Patient: AZEEZ DUNKER  Procedure(s) Performed: CYSTOSCOPY WITH BILATERAL RETROGRADE PYELOGRAM, LEFT URETEROSCOPY AND STENT PLACEMENT (Bilateral Ureter) HOLMIUM LASER APPLICATION (Left ) TRANSURETHRAL RESECTION OF BLADDER TUMOR (TURBT) (N/A Bladder)     Patient location during evaluation: PACU Anesthesia Type: General Level of consciousness: awake and alert Pain management: pain level controlled Vital Signs Assessment: post-procedure vital signs reviewed and stable Respiratory status: spontaneous breathing, nonlabored ventilation and respiratory function stable Cardiovascular status: blood pressure returned to baseline and stable Postop Assessment: no apparent nausea or vomiting Anesthetic complications: no    Last Vitals:  Vitals:   12/05/17 0500 12/05/17 0808  BP: 98/79   Pulse: 75 69  Resp: 11 16  Temp:  36.7 C  SpO2: 94% 92%    Last Pain:  Vitals:   12/05/17 0821  TempSrc:   PainSc: 0-No pain                 Hasaan Radde,W. EDMOND

## 2017-12-05 NOTE — Progress Notes (Signed)
Advanced Heart Failure VAD Team Note  Subjective:   Remains on milrinone 0.2 mcg. Received 1UPRBCs. Hgb 8.1>8.7.   Started Rocephin 12/03/17   CT Renal Stone. Stable L ureteral stone non obstructing. Increasing filling defect within the urinary bladder suspicious for an underlying mass. Although this may simply represent a hematoma,urologic consultation and direct visualization is recommended.  Cystocopy 12/04/17  - s/p Left ureteroscopy and stent placement.  - Bladder Tumor discovered. s/p resection. Pathology pending.   Feeling OK this am . Continues to have dark hematuria. Denies SOB or CP.   CBC pending this am. May need further RBCs.   INR 1.33. Holding heparin for as long as safe.  LDH 269 -> 285.  LVAD INTERROGATION:  HeartMate II LVAD:  Flow 4.9  liters/min, speed 8800, power 5.0, PI 6.3. Rare PI events.   Objective:    Vital Signs:   Temp:  [97.3 F (36.3 C)-98.9 F (37.2 C)] 98.1 F (36.7 C) (01/31 0808) Pulse Rate:  [59-78] 69 (01/31 0808) Resp:  [10-20] 16 (01/31 0808) BP: (80-98)/(69-83) 98/79 (01/31 0500) SpO2:  [90 %-98 %] 92 % (01/31 0808) Weight:  [197 lb 14.4 oz (89.8 kg)-199 lb 11.2 oz (90.6 kg)] 199 lb 11.2 oz (90.6 kg) (01/31 0500) Last BM Date: 12/03/17 Mean arterial Pressure 70-80s.  Intake/Output:   Intake/Output Summary (Last 24 hours) at 12/05/2017 0830 Last data filed at 12/05/2017 0335 Gross per 24 hour  Intake 1369.31 ml  Output 600 ml  Net 769.31 ml     Physical Exam    Physical Exam: GENERAL: NAD.   HEENT: Normal. NECK: Supple, JVP 6-7 cm. Carotids OK.  CARDIAC:  Mechanical heart sounds with LVAD hum present.  LUNGS:  CTAB, normal effort.  ABDOMEN:  NT, ND, no HSM. No bruits or masses. +BS  LVAD exit site: Well-healed and incorporated. Dressing dry and intact. No erythema or drainage. Stabilization device present and accurately applied. Driveline dressing changed daily per sterile technique. EXTREMITIES:  Warm and dry. No  cyanosis, clubbing, rash, or edema.  NEUROLOGIC:  Alert & oriented x 3. Cranial nerves grossly intact. Moves all 4 extremities w/o difficulty. Affect pleasant    GU: Dark maroon hematuria.   Telemetry   NSR 60-70s, personally reviewed.   EKG    No new tracings.    Labs   Basic Metabolic Panel: Recent Labs  Lab 11/30/17 0658 12/01/17 0439 12/02/17 0500 12/03/17 0255 12/04/17 0238 12/05/17 0241  NA 138 136 135 135 134*  --   K 4.6 3.8 4.4 4.1 4.1  --   CL 109 105 107 106 105  --   CO2 19* 21* 22 20* 21*  --   GLUCOSE 113* 112* 111* 110* 110*  --   BUN 23* 24* 23* 23* 15  --   CREATININE 1.33* 1.65* 1.38* 1.46* 1.33*  --   CALCIUM 8.5* 8.3* 8.4* 8.4* 8.3*  --   MG  --  1.9  --   --   --  2.0    Liver Function Tests: No results for input(s): AST, ALT, ALKPHOS, BILITOT, PROT, ALBUMIN in the last 168 hours. No results for input(s): LIPASE, AMYLASE in the last 168 hours. No results for input(s): AMMONIA in the last 168 hours.  CBC: Recent Labs  Lab 11/30/17 0658 12/01/17 0439 12/02/17 0500 12/03/17 0255 12/04/17 0238  WBC 5.7 5.3 5.0 5.3 5.5  HGB 9.2* 8.3* 8.8* 8.1* 8.7*  HCT 29.6* 27.2* 27.6* 25.8* 27.6*  MCV 90.0 88.6 88.2  88.7 87.1  PLT 180 179 174 174 193    INR: Recent Labs  Lab 12/01/17 0439 12/02/17 0500 12/03/17 0255 12/04/17 0238 12/05/17 0241  INR 1.90 1.75 1.58 1.48 1.33    Other results:  EKG:    Imaging   Dg Retrograde Pyelogram  Result Date: 12/04/2017 CLINICAL DATA:  Ureteral calculus EXAM: INTRAOPERATIVE LEFT RETROGRADE UROGRAPHY TECHNIQUE: Images were obtained with the C-arm fluoroscopic device intraoperatively and submitted for interpretation post-operatively. Please see the procedural report for the amount of contrast and the fluoroscopy time utilized. COMPARISON:  None. FINDINGS: Images demonstrate placement of a left double-J ureteral stent. IMPRESSION: Left double-J ureteral stent. Electronically Signed   By: Marybelle Killings M.D.    On: 12/04/2017 14:09   Dg Chest Port 1 View  Result Date: 12/03/2017 CLINICAL DATA:  Preoperative for lithotripsy on 12/04/2017 EXAM: PORTABLE CHEST 1 VIEW COMPARISON:  08/17/2017 FINDINGS: Postoperative changes in the mediastinum. Cardiac pacemaker. Left ventricular assist device. Shallow inspiration. Cardiac enlargement. Increasing interstitial pattern to the lungs since previous study suggesting increasing pulmonary edema. No focal consolidation. No blunting of costophrenic angles. No pneumothorax. Calcification of the aorta. IMPRESSION: Cardiac enlargement with increasing interstitial pattern to the lungs suggesting increasing interstitial edema. No focal consolidation. Aortic atherosclerosis. Electronically Signed   By: Lucienne Capers M.D.   On: 12/03/2017 23:14   Ct Renal Stone Study  Result Date: 12/03/2017 CLINICAL DATA:  Bilateral flank pain and hematuria EXAM: CT ABDOMEN AND PELVIS WITHOUT CONTRAST TECHNIQUE: Multidetector CT imaging of the abdomen and pelvis was performed following the standard protocol without IV contrast. COMPARISON:  10/18/2017 FINDINGS: Lower chest: Chronic fibrotic changes are noted in the lung bases bilaterally. Small left pleural effusion is seen slightly greater than that noted on the prior exam. Left ventricular assist device is noted. Defibrillator is noted as well. The previously seen tiny left lower lobe nodule is not as well appreciated on the current exam. Hepatobiliary: No focal liver abnormality is seen. Status post cholecystectomy. No biliary dilatation. Pancreas: Unremarkable. No pancreatic ductal dilatation or surrounding inflammatory changes. Spleen: Normal in size without focal abnormality. Adrenals/Urinary Tract: The adrenal glands are within normal limits. Bilateral cystic changes are noted stable from the prior exam. The largest of these on the left again demonstrates some mural calcification. This is also stable from the prior exam. Stable tiny  nonobstructing left renal stone is noted. No significant hydronephrotic changes are noted although a persistent 6 mm stone is noted in the proximal left ureter stable from the prior exam. No right renal calculi are noted although some fullness of the distal right ureter is noted similar to that seen on the prior exam. This may be related to the underlying bladder abnormality. The bladder is partially distended. There is a filling defect identified within the bladder which now measures approximately 4.1 x 2.3 cm. It previously measured 3.3 cm in greatest length. Stomach/Bowel: The appendix is not well visualized although no inflammatory changes to suggest appendicitis are noted. No obstructive or inflammatory changes are noted. Diverticulosis without diverticulitis is seen. Vascular/Lymphatic: Abdominal aortic aneurysm is again identified in the juxtarenal position. It again measures approximately 4.7 cm in greatest dimension and is stable in appearance. Changes consistent with distal aortobifemoral bypass grafting is noted. Bilateral renal artery bypasses are noted. Reproductive: Prostate is unremarkable. Other: No abdominal wall hernia or abnormality. No abdominopelvic ascites. Musculoskeletal: L4 compression deformity is again noted. Postoperative fusion from L3 to S L5 is seen. IMPRESSION: Stable proximal left ureteral  stone without significant hydronephrosis. Stable nonobstructing left renal stone is also again seen. Bilateral renal cystic change stable from the prior study. Increasing filling defect within the urinary bladder suspicious for an underlying mass. Although this may simply represent a hematoma, urologic consultation and direct visualization is recommended. Previously seen left lower lobe nodule is not well appreciated on this exam. 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 Prior bypass grafting is noted as well. Electronically Signed   By: Inez Catalina M.D.   On: 12/03/2017  11:14     Medications:     Scheduled Medications: . amiodarone  200 mg Oral Daily  . atorvastatin  80 mg Oral q1800  . docusate sodium  200 mg Oral Daily  . levothyroxine  150 mcg Oral QAC breakfast  . pantoprazole  40 mg Oral Daily  . ranolazine  500 mg Oral BID  . sildenafil  20 mg Oral TID  . sodium chloride flush  3 mL Intravenous Q12H  . tamsulosin  0.4 mg Oral Daily    Infusions: . sodium chloride Stopped (12/04/17 1330)  . sodium chloride    . cefTRIAXone (ROCEPHIN)  IV Stopped (12/04/17 1018)  . lactated ringers 0 mL/hr at 12/04/17 1226  . milrinone 0.2 mcg/kg/min (12/05/17 0800)    PRN Medications: sodium chloride, acetaminophen, acetaminophen, ondansetron (ZOFRAN) IV, ondansetron (ZOFRAN) IV, sodium chloride flush   VAD EVENTS   GU Event 09/01/14 had impacted ureteral stone and underwent cystoscopy and flexible ureteroscopy with lithotripsy and basket extraction.   09/2015 Hematuria and chronic nephrolithiasis. Renal US showed mild hyronephrosis and bilateral renal cysts. Dr. Risa Grill took back for repeat lithotripsy and stone extraction by ureteroscopy and J stent placement. 12/18 Recurrent hematuria. 81mm left ureteral stone. 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  4/2-18 - Episode of melena. Hgb 10.9-> 8.7. EGD/colonoscopy negative 12/18 - Melena with syncope  Hgb 6.5. Transfused 5u. 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  01/2016 R CEA. He was started on Plavix.  01/2016 Lumbar fusion L3-L5 with pedicle screws posterolateral arthrodesis with BMP, allograft  Patient Profile  [name] is a 77 y.o. 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 Required  short term milrinone.   Admitted 1/25 for elective ureteroscopy due to R ureteral stone.  Still with occasional hematuria. Continues with fatigue. RHC 1/25 showed low output due to RV failure (see below). Milrinone initiated at 0.125.  Assessment/Plan:    1. 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 Struggling with progressive NYHA III symptoms.  Garceno 1/25 showed primarily RHF with low output. Milrinone 0.2 mcg.  - No real symptomatic improvement with milrinone. Will cut back to 0.1 mcg/kg/min and plan to wean for discharged.  - Volume status stable.    - Continue Sildenafil 20 mg TID with RV failure.  - Continue VAD speed at 8800. VAD interrogated personally. Parameters stable.   2. Recurrent renal stone with ongoing hematuria and UTI: Left ureteral stone in 12/18. - Last dose of coumadin 1-24/2019 - 12/03/2017. CT with L stone and possible mass versus hematoma.  - Cystoscopy + Left Ureteroscopy with stent placed 12/04/17. -  Off heparin for now.  - Started rocephin 12/03/17. Ucx negative.  3. Bladder Tumor - Seen on CT 1/29 and confirmed/resected on cystoscopy 12/04/17. - Pathology pending 4. 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 Continue coumadin and plavix (with h/o CVA). Admitted 4/18 with recurrent GIB. Transfused 2u. EGD/colon negative. Admitted 12/18, Hgb 6.5, Transfused 5u. EGD with 2 AVMs treated with APC - Hgb pending today. Low threshold to transfuse.  5.  Atrial arrhythmias: Paroxysmal atrial fibrillation, atrial tachycardia.  S/p DC-CV 06/04/14, DC-CV 03/02/15 and 01/19/16.  - remains in NSR.  - Continue amiodarone.  6. HTN:  -MAPS stable.  7. LVAD:  - VAD interrogated personally. Parameters stable.  8. AKI on CKD, stage III:  - Creatinine pending today.  9. 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  - Continue atorvastatin. (also warfarin INR goal 2-2.5). S/p R CEA 01/13/16.  - Plavix stopped 12/18 due to recurrent GIB 10. H/o  amio-induced hypothyroidism: Amio restarted due to recurrent atrial arrhythmias and VT.  - Continue home levothyroxine dose.  11. VT:  - Underwent DC-CV 9/17. Repeat episode of slow VT 08/17/17 requiring DC-CV.  - Continue amio 200 daily. - Goal K > 4.0 Mg > 2.0  Stable post cystoscopy/ureteroscopy. Wean milrinone. Keep revatio at same dose for now.   I reviewed the LVAD parameters from today, and compared the results to the patient's prior recorded data.  No programming changes were made.  The LVAD is functioning within specified parameters.  The patient performs LVAD self-test daily.  LVAD interrogation was negative for any significant power changes, alarms or PI events/speed drops.  LVAD equipment check completed and is in good working order.  Back-up equipment present.   LVAD education done on emergency procedures and precautions and reviewed exit site care.   Length of Stay: [address]  Annamaria Helling 12/05/2017, 8:30 AM  VAD Team --- VAD ISSUES ONLY--- Pager [phone] (7am - 7am)  Advanced Heart Failure Team  Pager [phone] (M-F; 7a - 4p)  Please contact [facility] for night-coverage after hours (4p -7a ) and weekends on [email or web address]  Patient seen with PA, agree with the above note.  Yesterday, cystoscopy done and bladder tumor removed.  Today, still with some hematuria.  LVAD parameters stable, not volume overloaded on exam.  Remains in NSR on telemetry.   Per discussion with urology, will hold heparin gtt until tomorrow around noon (48 hrs post-tumor excision).  Will start back on warfarin tonight.  INR 1.33.  LDH stable.  Needs CBC today, transfuse hgb < 8.   Milrinone has not helped him symptomatically.  Decrease to 0.1 today and stop tomorrow.  Will continue sildenafil 20 mg tid for RV.   Loralie Champagne 12/05/2017 10:15 AM

## 2017-12-05 NOTE — Progress Notes (Signed)
LVAD Coordinator Rounding Note:  Admitted 11/29/17 due for elective ureteroscopy due to right ureteral stone. RHC today shows low output due to RV failure. Milrinone initiated.   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 Cyndia Bent with oversewn aortic valve under DT criteria.  Pt is post op day 1 from left ureteroscopy with laser lithotripsy, retrograde peylogram, left ureteral stent, and  resection of bladder tumor. Pt denies any pain. Foley cath with light bloody urine. Heparin gtt remains off due to post op bleeding risk.  Vital signs: Temp: 98.1 HR: 69 Doppler Pressure: 80 Automatic BP:  71/58 (64) O2 Sat:  92% on RA   Weight: 194>191>196>197>196>197>199 lbs  GTTS: Milrinone 0.2 mcg/kg/min Heparin - off 12/04/17  LVAD interrogation reveals:  Speed: 8800 Flow:  4.8 Power:  5.0w PI: 5.9 Alarms: none Events:  none  Fixed speed: 8800 Low speed limit: 8200  Drive Line: Sorbaview dressing dry and intact; anchor intact and accurately applied. Weekly dressing changes; Due 12/08/17. Last changed 12/01/17 by wife.  Labs:  LDH trend: 362>332>270>275>280>269>285  INR trend: 2.20>2.12>1.89>1.90>1.75>1.58>1.48>1.33  Anticoagulation Plan: - INR Goal:  2.0 - 2.5 (coumadin on hold post op) - ASA Dose: no ASA (removed 02/2017 due to GI bleed) - Heparin bridge - on hold post op  Device: -Dual Med[employer]onic ICD -Therapies: on at 200 bpm - Last device check 10/02/17  Significant Events on VAD Support:  09/01/14>>ureteral stone with cystoscopy and flexible ureteroscopy with lithotripsy and basket extraction 02/2015 >>GIB with 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 Started on Plavix 3/17>>Lumbar fusion L3-L5 9/17>>VT with DC-CV; amio increased 10/17>>ramp with RV failure; speed decreased to 8800 02/2017> GIB (removed ASA, scopes  negative) 03/2017>percutaneous lead repair after pump stops  10/18>>Slow VT. Cardioverted in ED and amiodarone uptitrated 12/18>>melena with scope. Hgb 6.5. EGD with 2 AVMs with APC 12/18>>recurrent hematuria. 6 mm left ureteral stone 1/19>>7 mm left ureteral stone removal with laser lithotripsy; left ureteral stent. Resection of 6 cm bladder tumor   Plan/Recommendations:   1. Dressing change weekly by wife; due 12/08/17. 2. Please call VAD pager if any issues with VAD equipment or questions, concerns.  Zada Girt RN, BSN VAD Coordinator 24/7 Pager [phone]

## 2017-12-05 NOTE — Progress Notes (Signed)
[place] for Heparin Indication: LVAD  Allergies  Allergen Reactions  . Demerol [Meperidine] Other (See Comments)    Paralysis. Could only move eyes.     Patient Measurements: Height: 6' (182.9 cm) Weight: 199 lb 11.2 oz (90.6 kg) IBW/kg (Calculated) : 77.6   Vital Signs: Temp: 98.1 F (36.7 C) (01/31 0808) Temp Source: Oral (01/31 0808) BP: 98/79 (01/31 0500) Pulse Rate: 69 (01/31 0808)  Labs: Recent Labs    12/02/17 1236 12/03/17 0255 12/04/17 0238 12/05/17 0241  HGB  --  8.1* 8.7*  --   HCT  --  25.8* 27.6*  --   PLT  --  174 193  --   LABPROT  --  18.8* 17.8* 16.3*  INR  --  1.58 1.48 1.33  HEPARINUNFRC 0.52 0.42 0.46  --   CREATININE  --  1.46* 1.33*  --     Medications:  Scheduled:  . amiodarone  200 mg Oral Daily  . atorvastatin  80 mg Oral q1800  . docusate sodium  200 mg Oral Daily  . levothyroxine  150 mcg Oral QAC breakfast  . pantoprazole  40 mg Oral Daily  . ranolazine  500 mg Oral BID  . sildenafil  20 mg Oral TID  . sodium chloride flush  3 mL Intravenous Q12H  . tamsulosin  0.4 mg Oral Daily    Assessment: 62 yoM with LVAD on warfarin at home admitted for RHC and ureteroscopy. Pharmacy originally consulted to hold warfarin and start heparin infusion once INR < 2.0. Home regimen: 2 mg daily except 4 mg on Tuesday. Last dose of warfarin was 1/24, INR on admit 2.20 - underwent cystoscopy + ureteroscopy with stent placement yesterday.  INR is 1.33 today. Continues to have hematuria. Hgb increased to 9.4 today, platelets trending down slightly to 165. LDH is stable.   Heparin infusion continues to remain held- however, given HM2 and subtherapeutic INR, discussed with team and will start warfarin tonight without heparin infusion.  Goal of Therapy:  INR 2-2.5 Monitor platelets by anticoagulation protocol: Yes   Plan -Continue to hold heparin infusion -Start warfarin 4 mg tonight -Daily HL, CBC -Will  follow up plan for starting heparin infusion while INR is subtherapeutic   Doylene Canard, PharmD Clinical Pharmacist  Pager: [phone] Clinical Phone for 12/05/2017 until 3:30pm: [phone] If after 3:30pm, please call main pharmacy at [phone] 12/05/2017 10:07 AM

## 2017-12-06 ENCOUNTER — Inpatient Hospital Stay (HOSPITAL_COMMUNITY): Payer: Medicare Other

## 2017-12-06 DIAGNOSIS — C679 Malignant neoplasm of bladder, unspecified: Secondary | ICD-10-CM

## 2017-12-06 DIAGNOSIS — N179 Acute kidney failure, unspecified: Secondary | ICD-10-CM

## 2017-12-06 LAB — CBC WITH DIFFERENTIAL/PLATELET
BASOS PCT: 0 %
Basophils Absolute: 0 10*3/uL (ref 0.0–0.1)
Eosinophils Absolute: 0.2 10*3/uL (ref 0.0–0.7)
Eosinophils Relative: 2 %
HEMATOCRIT: 31.9 % — AB (ref 39.0–52.0)
HEMOGLOBIN: 9.8 g/dL — AB (ref 13.0–17.0)
LYMPHS ABS: 0.8 10*3/uL (ref 0.7–4.0)
LYMPHS PCT: 6 %
MCH: 27.8 pg (ref 26.0–34.0)
MCHC: 30.7 g/dL (ref 30.0–36.0)
MCV: 90.4 fL (ref 78.0–100.0)
MONO ABS: 2.2 10*3/uL — AB (ref 0.1–1.0)
MONOS PCT: 16 %
NEUTROS ABS: 10.3 10*3/uL — AB (ref 1.7–7.7)
NEUTROS PCT: 76 %
Platelets: 200 10*3/uL (ref 150–400)
RBC: 3.53 MIL/uL — ABNORMAL LOW (ref 4.22–5.81)
RDW: 18.1 % — AB (ref 11.5–15.5)
WBC: 13.5 10*3/uL — ABNORMAL HIGH (ref 4.0–10.5)

## 2017-12-06 LAB — LACTATE DEHYDROGENASE: LDH: 319 U/L — ABNORMAL HIGH (ref 98–192)

## 2017-12-06 LAB — PROTIME-INR
INR: 1.27
PROTHROMBIN TIME: 15.8 s — AB (ref 11.4–15.2)

## 2017-12-06 LAB — BASIC METABOLIC PANEL
ANION GAP: 12 (ref 5–15)
BUN: 27 mg/dL — ABNORMAL HIGH (ref 6–20)
CALCIUM: 8.8 mg/dL — AB (ref 8.9–10.3)
CHLORIDE: 103 mmol/L (ref 101–111)
CO2: 19 mmol/L — AB (ref 22–32)
Creatinine, Ser: 2.46 mg/dL — ABNORMAL HIGH (ref 0.61–1.24)
GFR calc non Af Amer: 24 mL/min — ABNORMAL LOW (ref >60)
GFR, EST AFRICAN AMERICAN: 27 mL/min — AB (ref >60)
Glucose, Bld: 124 mg/dL — ABNORMAL HIGH (ref 65–99)
Potassium: 4.5 mmol/L (ref 3.5–5.1)
Sodium: 134 mmol/L — ABNORMAL LOW (ref 135–145)

## 2017-12-06 LAB — HEPARIN LEVEL (UNFRACTIONATED): Heparin Unfractionated: <0.1 IU/mL — ABNORMAL LOW (ref 0.30–0.70)

## 2017-12-06 MED ORDER — GUAIFENESIN 100 MG/5ML PO SOLN
5.0000 mL | ORAL | Status: DC | PRN
  Administered 2017-12-06 – 2017-12-08 (×6): 100 mg via ORAL
  Filled 2017-12-06 (×6): qty 5

## 2017-12-06 MED ORDER — HEPARIN (PORCINE) IN NACL 100-0.45 UNIT/ML-% IJ SOLN
1500.0000 [IU]/h | INTRAMUSCULAR | Status: DC
  Administered 2017-12-06: 700 [IU]/h via INTRAVENOUS
  Filled 2017-12-06 (×3): qty 250

## 2017-12-06 MED ORDER — WARFARIN SODIUM 4 MG PO TABS
4.0000 mg | ORAL_TABLET | Freq: Once | ORAL | Status: AC
  Administered 2017-12-06: 4 mg via ORAL
  Filled 2017-12-06: qty 1

## 2017-12-06 NOTE — Care Management Important Message (Signed)
Important Message  Patient Details  Name: [name] MRN: [id number] Date of Birth: Sep 18, 1941   Medicare Important Message Given:  Yes    Carles Collet, RN 12/06/2017, 1:39 PM

## 2017-12-06 NOTE — Progress Notes (Signed)
Urology Inpatient Progress Report  Acute on chronic systolic (congestive) heart failure (HCC) [I50.23]  Procedure(s): CYSTOSCOPY WITH BILATERAL RETROGRADE PYELOGRAM, LEFT URETEROSCOPY AND STENT PLACEMENT HOLMIUM LASER APPLICATION TRANSURETHRAL RESECTION OF BLADDER TUMOR (TURBT)  2 Days Post-Op   Intv/Subj: No acute events overnight. Patient is without complaint. Creatinine is increasing. Hematuria persist but is thin and hemoglobin is stable.he is on Coumadin now.  Heparin was also started back today at noon. 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  Muscularis propria was present and uninvolved.  Active Problems:   Acute on chronic systolic (congestive) heart failure (HCC)   Acute on chronic systolic heart failure (HCC)  Current Facility-Administered Medications  Medication Dose Route Frequency Provider Last Rate Last Dose  . 0.9 %  sodium chloride infusion  250 mL Intravenous PRN Bensimhon, Shaune Pascal, MD   Stopped at 12/04/17 1330  . 0.9 %  sodium chloride infusion   Intravenous Once Kyung Rudd, CRNA      . acetaminophen (TYLENOL) tablet 650 mg  650 mg Oral Q4H PRN Bensimhon, Shaune Pascal, MD      . acetaminophen (TYLENOL) tablet 650 mg  650 mg Oral Q4H PRN Clegg, Amy D, NP      . amiodarone (PACERONE) tablet 200 mg  200 mg Oral Daily Larey Dresser, MD   200 mg at 12/06/17 7341  . atorvastatin (LIPITOR) tablet 80 mg  80 mg Oral q1800 Larey Dresser, MD   80 mg at 12/06/17 1642  . cefTRIAXone (ROCEPHIN) 1 g in dextrose 5 % 50 mL IVPB  1 g Intravenous Q24H Bensimhon, Shaune Pascal, MD   Stopped at 12/06/17 1209  . docusate sodium (COLACE) capsule 200 mg  200 mg Oral Daily Larey Dresser, MD   200 mg at 12/06/17 9379  . guaiFENesin (ROBITUSSIN) 100 MG/5ML solution 100 mg  5 mL Oral Q4H PRN Bensimhon, Shaune Pascal, MD   100 mg at 12/06/17 1452  . heparin ADULT infusion 100 units/mL (25000 units/283mL sodium chloride 0.45%)  700  Units/hr Intravenous Continuous Otilio Miu, RPH 7 mL/hr at 12/06/17 1700 700 Units/hr at 12/06/17 1700  . lactated ringers infusion   Intravenous Continuous Effie Berkshire, MD 0 mL/hr at 12/04/17 1226    . levothyroxine (SYNTHROID, LEVOTHROID) tablet 150 mcg  150 mcg Oral QAC breakfast Einar Grad, RPH   150 mcg at 12/06/17 0240  . milrinone (PRIMACOR) 20 MG/100 ML (0.2 mg/mL) infusion  0.1 mcg/kg/min Intravenous Continuous Bensimhon, Shaune Pascal, MD 2.6 mL/hr at 12/06/17 1700 0.1 mcg/kg/min at 12/06/17 1700  . ondansetron (ZOFRAN) injection 4 mg  4 mg Intravenous Q6H PRN Bensimhon, Shaune Pascal, MD      . ondansetron (ZOFRAN) injection 4 mg  4 mg Intravenous Q6H PRN Clegg, Amy D, NP      . pantoprazole (PROTONIX) EC tablet 40 mg  40 mg Oral Daily Larey Dresser, MD   40 mg at 12/06/17 9735  . ranolazine (RANEXA) 12 hr tablet 500 mg  500 mg Oral BID Larey Dresser, MD   500 mg at 12/06/17 3299  . sildenafil (REVATIO) tablet 20 mg  20 mg Oral TID Bensimhon, Shaune Pascal, MD   20 mg at 12/06/17 1642  . sodium chloride flush (NS) 0.9 % injection 3 mL  3 mL Intravenous Q12H Bensimhon, Shaune Pascal, MD   3 mL at 12/06/17 0940  . sodium chloride flush (NS) 0.9 % injection 3 mL  3  mL Intravenous PRN Bensimhon, Shaune Pascal, MD      . tamsulosin [facility]) capsule 0.4 mg  0.4 mg Oral Daily Larey Dresser, MD   0.4 mg at 12/06/17 4010  . warfarin (COUMADIN) tablet 4 mg  4 mg Oral ONCE-1800 Otilio Miu, [place]      . Warfarin - Pharmacist Dosing Inpatient   Does not apply q1800 Larey Dresser, MD         Objective: Vital: Vitals:   12/06/17 0349 12/06/17 0738 12/06/17 1125 12/06/17 1534  BP:  (!) 89/73 94/83 (!) 86/70  Pulse: 69 78 68 76  Resp:  19 14 12   Temp: 97.7 F (36.5 C) 98.4 F (36.9 C) 98.1 F (36.7 C) 99.5 F (37.5 C)  TempSrc: Oral Oral Oral Oral  SpO2:  95% 92% 93%  Weight:      Height:       I/Os: I/O last 3 completed shifts: In: 1342.3 [P.O.:1160; I.V.:132.3; IV  Piggyback:50] Out: 1225 [Urine:1225]  Physical Exam:  General: Patient is in no apparent distress Lungs: Normal respiratory effort, chest expands symmetrically. GI: The abdomen is soft and nontender without mass. Foley: Draining red urine but thin. Ext: lower extremities symmetric  Lab Results: Recent Labs    12/04/17 0238 12/05/17 0943 12/06/17 0326  WBC 5.5 11.6* 13.5*  HGB 8.7* 9.4* 9.8*  HCT 27.6* 30.2* 31.9*   Recent Labs    12/04/17 0238 12/05/17 1016 12/06/17 0327  NA 134* 135 134*  K 4.1 4.6 4.5  CL 105 107 103  CO2 21* 19* 19*  GLUCOSE 110* 145* 124*  BUN 15 24* 27*  CREATININE 1.33* 2.21* 2.46*  CALCIUM 8.3* 8.7* 8.8*   Recent Labs    12/04/17 0238 12/05/17 0241 12/06/17 0327  INR 1.48 1.33 1.27   No results for input(s): LABURIN in the last 72 hours. Results for orders placed or performed during the hospital encounter of 11/29/17  MRSA PCR Screening     Status: None   Collection Time: 11/29/17  5:47 PM  Result Value Ref Range Status   MRSA by PCR NEGATIVE NEGATIVE Final    Comment:        The GeneXpert MRSA Assay (FDA approved for NASAL specimens only), is one component of a comprehensive MRSA colonization surveillance program. It is not intended to diagnose MRSA infection nor to guide or monitor treatment for MRSA infections.   Culture, Urine     Status: Abnormal   Collection Time: 12/02/17  1:30 PM  Result Value Ref Range Status   Specimen Description URINE, CLEAN CATCH  Final   Special Requests NONE  Final   Culture <10,000 COLONIES/mL INSIGNIFICANT GROWTH (A)  Final   Report Status 12/03/2017 FINAL  Final   *Note: Due to a large number of results and/or encounters for the requested time period, some results have not been displayed. A complete set of results can be found in Results Review.    Studies/Results: No results found.  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  Plan: I discussed the pathology with the patient.  I discussed that often times with a diagnosis of T1, I recommend restaging TURBT.  However, given his multiple comorbidities and the fact that grossly all the disease appeared to be resected  in the operating room, I recommended just proceeding with intravesical BCG once he has recovered and gross hematuria has resolved.  I expect there to be some degree of gross hematuria as long as he has the catheter and stent in place,   Especially since he is on both Coumadin and heparin.  Given his acute renal insufficiency, I ordered a renal ultrasound.  Of note, during the resection the mass  was overlying the right ureteral orifice and I had to resect over this.  I was never able to identify the right ureteral orifice.  He is not having pain but the renal ultrasound will evaluate for any interval development of hydronephrosis which may suggest obstruction.  I tentatively plan to remove his catheter and stent on Monday morning.   Link Snuffer, MD Urology 12/06/2017, 5:36 PM

## 2017-12-06 NOTE — Progress Notes (Signed)
Advanced Heart Failure VAD Team Note  Subjective:    Started Rocephin 12/03/17   CT Renal Stone. Stable L ureteral stone non obstructing. Increasing filling defect within the urinary bladder suspicious for an underlying mass. Although this may simply represent a hematoma,urologic consultation and direct visualization is recommended.  Cystocopy 12/04/17  - s/p Left ureteroscopy and stent placement.  - Bladder Tumor discovered. s/p resection. Pathology pending.   Remains on milrinone 0.1 mcg/kg/min.   Feeling good. Continues to have dark red urine, but "lightening up". Denies SOB, lightheadedness, or dizziness.   CBC pending this am. 9.4.   INR 1.27. Holding heparin for as long as safe. (Plan to resume at noon) LDH 269 -> 285 -> 319  LVAD INTERROGATION:  HeartMate II LVAD:  Flow 4.7 liters/min, speed 8800, power 5.0, PI 5.8. Rare PI events.   Objective:    Vital Signs:   Temp:  [97.5 F (36.4 C)-97.7 F (36.5 C)] 97.7 F (36.5 C) (02/01 0349) Pulse Rate:  [59-78] 78 (02/01 0738) Resp:  [11-17] 17 (02/01 0300) BP: (85-115)/(69-87) 115/77 (02/01 0339) SpO2:  [92 %-94 %] 94 % (02/01 0300) Weight:  [200 lb 14.4 oz (91.1 kg)] 200 lb 14.4 oz (91.1 kg) (02/01 0300) Last BM Date: 12/03/17 Mean arterial Pressure 84s  Intake/Output:   Intake/Output Summary (Last 24 hours) at 12/06/2017 0810 Last data filed at 12/06/2017 0300 Gross per 24 hour  Intake 1051.6 ml  Output 775 ml  Net 276.6 ml     Physical Exam    Physical Exam: GENERAL: Well appearing this am. NAD.  HEENT: Normal. NECK: Supple, JVP 7-8 cm. Carotids OK.  CARDIAC:  Mechanical heart sounds with LVAD hum present.  LUNGS:  CTAB, normal effort.  ABDOMEN:  NT, ND, no HSM. No bruits or masses. +BS  LVAD exit site: Well-healed and incorporated. Dressing dry and intact. No erythema or drainage. Stabilization device present and accurately applied. Driveline dressing changed daily per sterile technique. EXTREMITIES:  Warm  and dry. No cyanosis, clubbing, rash, or edema.  NEUROLOGIC:  Alert & oriented x 3. Cranial nerves grossly intact. Moves all 4 extremities w/o difficulty. Affect pleasant. GU: Foley, Dark red urine.  Telemetry   NSR 60-70s, personally reviewed.   EKG    No new tracings.    Labs   Basic Metabolic Panel: Recent Labs  Lab 12/01/17 0439 12/02/17 0500 12/03/17 0255 12/04/17 0238 12/05/17 0241 12/05/17 1016 12/06/17 0327  NA 136 135 135 134*  --  135 134*  K 3.8 4.4 4.1 4.1  --  4.6 4.5  CL 105 107 106 105  --  107 103  CO2 21* 22 20* 21*  --  19* 19*  GLUCOSE 112* 111* 110* 110*  --  145* 124*  BUN 24* 23* 23* 15  --  24* 27*  CREATININE 1.65* 1.38* 1.46* 1.33*  --  2.21* 2.46*  CALCIUM 8.3* 8.4* 8.4* 8.3*  --  8.7* 8.8*  MG 1.9  --   --   --  2.0  --   --     Liver Function Tests: No results for input(s): AST, ALT, ALKPHOS, BILITOT, PROT, ALBUMIN in the last 168 hours. No results for input(s): LIPASE, AMYLASE in the last 168 hours. No results for input(s): AMMONIA in the last 168 hours.  CBC: Recent Labs  Lab 12/01/17 0439 12/02/17 0500 12/03/17 0255 12/04/17 0238 12/05/17 0943  WBC 5.3 5.0 5.3 5.5 11.6*  NEUTROABS  --   --   --   --  10.0*  HGB 8.3* 8.8* 8.1* 8.7* 9.4*  HCT 27.2* 27.6* 25.8* 27.6* 30.2*  MCV 88.6 88.2 88.7 87.1 88.8  PLT 179 174 174 193 165    INR: Recent Labs  Lab 12/02/17 0500 12/03/17 0255 12/04/17 0238 12/05/17 0241 12/06/17 0327  INR 1.75 1.58 1.48 1.33 1.27    Other results:  EKG:    Imaging   Dg Retrograde Pyelogram  Result Date: 12/04/2017 CLINICAL DATA:  Ureteral calculus EXAM: INTRAOPERATIVE LEFT RETROGRADE UROGRAPHY TECHNIQUE: Images were obtained with the C-arm fluoroscopic device intraoperatively and submitted for interpretation post-operatively. Please see the procedural report for the amount of contrast and the fluoroscopy time utilized. COMPARISON:  None. FINDINGS: Images demonstrate placement of a left double-J  ureteral stent. IMPRESSION: Left double-J ureteral stent. Electronically Signed   By: Marybelle Killings M.D.   On: 12/04/2017 14:09     Medications:     Scheduled Medications: . amiodarone  200 mg Oral Daily  . atorvastatin  80 mg Oral q1800  . docusate sodium  200 mg Oral Daily  . levothyroxine  150 mcg Oral QAC breakfast  . pantoprazole  40 mg Oral Daily  . ranolazine  500 mg Oral BID  . sildenafil  20 mg Oral TID  . sodium chloride flush  3 mL Intravenous Q12H  . tamsulosin  0.4 mg Oral Daily  . Warfarin - Pharmacist Dosing Inpatient   Does not apply q1800    Infusions: . sodium chloride Stopped (12/04/17 1330)  . sodium chloride    . cefTRIAXone (ROCEPHIN)  IV Stopped (12/05/17 1036)  . lactated ringers 0 mL/hr at 12/04/17 1226  . milrinone 0.1 mcg/kg/min (12/06/17 0300)    PRN Medications: sodium chloride, acetaminophen, acetaminophen, ondansetron (ZOFRAN) IV, ondansetron (ZOFRAN) IV, sodium chloride flush   VAD EVENTS   GU Event 09/01/14 had impacted ureteral stone and underwent cystoscopy and flexible ureteroscopy with lithotripsy and basket extraction.   09/2015 Hematuria and chronic nephrolithiasis. Renal US showed mild hyronephrosis and bilateral renal cysts. Dr. Risa Grill took back for repeat lithotripsy and stone extraction by ureteroscopy and J stent placement. 12/18 Recurrent hematuria. 68mm left ureteral stone. 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  4/2-18 - Episode of melena. Hgb 10.9-> 8.7. EGD/colonoscopy negative 12/18 - Melena with syncope  Hgb 6.5. Transfused 5u. 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  01/2016 R CEA. He was started on Plavix.  01/2016 Lumbar fusion L3-L5 with pedicle screws posterolateral arthrodesis with BMP, allograft  Patient Profile  [name] is a 77 y.o. male  with h/o VT, PAD and severe systolic HF (EF 45-80%)  due to mixed ischemic/nonischemic CM he is s/p HM II LVAD implant for destination therapy on October 19, 2013.VAD implantation was complicated by VT, syncope, AF/Aflutter and RHF. Required short term milrinone.   Admitted 1/25 for elective ureteroscopy due to R ureteral stone.  Still with occasional hematuria. Continues with fatigue. RHC 1/25 showed low output due to RV failure (see below). Milrinone initiated at 0.125.  Assessment/Plan:    1. 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 Struggling with progressive NYHA III symptoms.  RHC 1/25 showed primarily RHF with low output.  - No real symptomatic improvement with milrinone. Continue for now at 0.1 with AKI.  - Volume status stable on exam.     - Continue  Sildenafil 20 mg TID with RV failure.  - Continue VAD speed at 8800. VAD interrogated personally. Parameters stable.  2. Recurrent renal stone with ongoing hematuria and UTI: Left ureteral stone in 12/18. - Last dose of coumadin 1-24/2019 - 12/03/2017. CT with L stone and possible mass versus hematoma.  - Cystoscopy + Left Ureteroscopy with stent placed 12/04/17. - Off heparin for now. Likely resume this afternoon.  - Started rocephin 12/03/17. Ucx negative.  3. Bladder Tumor - Seen on CT 1/29 and confirmed/resected on cystoscopy 12/04/17. - Pathology pending.  4. 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 Continue coumadin and plavix (with h/o CVA). Admitted 4/18 with recurrent GIB. Transfused 2u. EGD/colon negative. Admitted 12/18, Hgb 6.5, Transfused 5u. EGD with 2 AVMs treated with APC - Hgb pending this am.  5.  Atrial arrhythmias: Paroxysmal atrial fibrillation, atrial tachycardia.  S/p DC-CV 06/04/14, DC-CV 03/02/15 and 01/19/16.  - remains in NSR.  - Continue amiodarone.  6. HTN:  - MAPs stable.  7. LVAD:  - VAD interrogated personally. Parameters stable.  8. AKI on CKD, stage III:  - Creatinine elevated post op. Continue milrinone for today.    - Follow. If trending down tomorrow, no treatment. If remains up can give gentle IVF.  9. 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 No further.  - Continue atorvastatin. (also warfarin INR goal 2-2.5). S/p R CEA 01/13/16.  - Plavix stopped 12/18 due to recurrent GIB 10. H/o amio-induced hypothyroidism: Amio restarted due to recurrent atrial arrhythmias and VT.  - Continue home levothyroxine dose. No change.  11. VT:  - Underwent DC-CV 9/17. Repeat episode of slow VT 08/17/17 requiring DC-CV.  - Continue amio 200 daily.  - Goal K > 4.0 Mg > 2.0. Stable.   I reviewed the LVAD parameters from today, and compared the results to the patient's prior recorded data.  No programming changes were made.  The LVAD is functioning within specified parameters.  The patient performs LVAD self-test daily.  LVAD interrogation was negative for any significant power changes, alarms or PI events/speed drops.  LVAD equipment check completed and is in good working order.  Back-up equipment present.   LVAD education done on emergency procedures and precautions and reviewed exit site care.   Length of Stay: [address]  Annamaria Helling 12/06/2017, 8:10 AM  VAD Team --- VAD ISSUES ONLY--- Pager [phone] (7am - 7am)  Advanced Heart Failure Team  Pager [phone] (M-F; 7a - 4p)   Please contact [facility] for night-coverage after hours (4p -7a ) and weekends on [email or web address]  Patient seen and examined with the above-signed Advanced Practice Provider and/or Housestaff. I personally reviewed laboratory data, imaging studies and relevant notes. I independently examined the patient and formulated the important aspects of the plan. I have edited the note to reflect any of my changes or salient points. I have personally discussed the plan with the patient and/or family.  Feeling better today but still with dark urine. LDH climbing. Will begin heparin carefully at low-dose. Creatinine continues to rise. Suspect ATN. Have  encouraged po intake. MAPs stable. Continue low-dose milrinone for now. VAD interrogated personally. Parameters stable.  Pathology back: There is extensive high grade urothelial carcinoma with foci suspicious for stromal invasion. Will discuss next steps with Urology.   Glori Bickers, MD  1:30 PM

## 2017-12-06 NOTE — Progress Notes (Signed)
ANTICOAGULATION CONSULT NOTE - Follow Up Consult  Pharmacy Consult for Heparin and Warfarin Indication: LVAD  Allergies  Allergen Reactions  . Demerol [Meperidine] Other (See Comments)    Paralysis. Could only move eyes.     Patient Measurements: Height: 6' (182.9 cm) Weight: 200 lb 14.4 oz (91.1 kg) IBW/kg (Calculated) : 77.6  Vital Signs: Temp: 99.7 F (37.6 C) (02/01 1919) Temp Source: Oral (02/01 1919) BP: 76/65 (02/01 1919) Pulse Rate: 70 (02/01 1919)  Labs: Recent Labs    12/04/17 0238 12/05/17 0241 12/05/17 0943 12/05/17 1016 12/06/17 0326 12/06/17 0327 12/06/17 2221  HGB 8.7*  --  9.4*  --  9.8*  --   --   HCT 27.6*  --  30.2*  --  31.9*  --   --   PLT 193  --  165  --  200  --   --   LABPROT 17.8* 16.3*  --   --   --  15.8*  --   INR 1.48 1.33  --   --   --  1.27  --   HEPARINUNFRC 0.46  --   --   --   --   --  <0.10*  CREATININE 1.33*  --   --  2.21*  --  2.46*  --     Estimated Creatinine Clearance: 27.2 mL/min (A) (by C-G formula based on SCr of 2.46 mg/dL (H)).  Assessment: 29 yoM with LVAD on warfarin at home admitted for RHC and ureteroscopy. INR 2.2 on admit, warfarin held, and he was started on heparin once INR < 2.0. S/P cystoscopy + ureteroscopy with stent placement 1/30. Warfarin resumed 1/31. INR 1.27. Heparin to be restarted today at 'low dose' per Dr. Sung Amabile and he would like to aim for a level ~ 0.3. CBC stable. LDH trending up slightly to 285>319. Still with dark red urine.  Home regimen: 2 mg daily except 4 mg on Tuesday - last taken 1/24  2/1 PM: heparin level sub-therapeutic   Goal of Therapy:  INR 2-2.5 Heparin level ~0.3 Monitor platelets by anticoagulation protocol: Yes   Plan:  Inc heparin to 1000 units/hr 0800 HL  Narda Bonds 12/06/2017,11:44 PM

## 2017-12-06 NOTE — Progress Notes (Signed)
Had gagging spell and brought out thick white sputum in moderate amount. Claimed to have this spell on and off for the past few years and cannot do anything about this and MD aware. Continue to monitor.

## 2017-12-06 NOTE — Progress Notes (Signed)
ANTICOAGULATION CONSULT NOTE - Follow Up Consult  Pharmacy Consult for Heparin and Warfarin Indication: LVAD  Allergies  Allergen Reactions  . Demerol [Meperidine] Other (See Comments)    Paralysis. Could only move eyes.     Patient Measurements: Height: 6' (182.9 cm) Weight: 200 lb 14.4 oz (91.1 kg) IBW/kg (Calculated) : 77.6  Vital Signs: Temp: 98.4 F (36.9 C) (02/01 0738) Temp Source: Oral (02/01 0738) BP: 89/73 (02/01 0738) Pulse Rate: 78 (02/01 0738)  Labs: Recent Labs    12/04/17 0238 12/05/17 0241 12/05/17 0943 12/05/17 1016 12/06/17 0326 12/06/17 0327  HGB 8.7*  --  9.4*  --  9.8*  --   HCT 27.6*  --  30.2*  --  31.9*  --   PLT 193  --  165  --  200  --   LABPROT 17.8* 16.3*  --   --   --  15.8*  INR 1.48 1.33  --   --   --  1.27  HEPARINUNFRC 0.46  --   --   --   --   --   CREATININE 1.33*  --   --  2.21*  --  2.46*    Estimated Creatinine Clearance: 27.2 mL/min (A) (by C-G formula based on SCr of 2.46 mg/dL (H)).  Assessment: 66 yoM with LVAD on warfarin at home admitted for RHC and ureteroscopy. INR 2.2 on admit, warfarin held, and he was started on heparin once INR < 2.0. S/P cystoscopy + ureteroscopy with stent placement 1/30. Warfarin resumed 1/31. INR 1.27. Heparin to be restarted today at 'low dose' per Dr. Sung Amabile and he would like to aim for a level ~ 0.3. CBC stable. LDH trending up slightly to 285>319. Still with dark red urine.  Home regimen: 2 mg daily except 4 mg on Tuesday - last taken 1/24  Goal of Therapy:  INR 2-2.5 Heparin level ~0.3 Monitor platelets by anticoagulation protocol: Yes   Plan:  1) At 1400 today, resume heparin at 700 units/hr 2) Check 8 hour heparin level 3) Warfarin 4mg  tonight 4) Daily INR, CBC, heparin level, LDH  Deboraha Sprang 12/06/2017,11:25 AM

## 2017-12-06 NOTE — Care Management Note (Signed)
Case Management Note  Patient Details  Name: [name] MRN: [id number] Date of Birth: 04/12/1941  Subjective/Objective:    Pt admitted with CHF - pt has hx of LVAD                Action/Plan:  PTA independent from home.     Expected Discharge Date:                  Expected Discharge Plan:  Home/Self Care(independent from home)  In-House Referral:     Discharge planning Services  CM Consult  Post Acute Care Choice:    Choice offered to:     DME Arranged:    DME Agency:     HH Arranged:    HH Agency:     Status of Service:     If discussed at [employer], dates discussed:    Additional Comments: 12/06/2017 HF team confirmed pt will not discharge home on Milrinone.   [name], RN 12/06/2017, 9:39 AM

## 2017-12-06 NOTE — Progress Notes (Signed)
LVAD Coordinator Rounding Note:  Admitted 11/29/17 due for elective ureteroscopy due to right ureteral stone. RHC today shows low output due to RV failure. Milrinone initiated.   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 Cyndia Bent with oversewn aortic valve under DT criteria.  Pt is post op day 2 from left ureteroscopy with laser lithotripsy, retrograde peylogram, left ureteral stent, and  resection of bladder tumor. Pt denies any pain. Foley cath with dark brown urine; pt reports urine is "less bloody". Heparin gtt remains off due to post op bleeding risk.  Dr.Bensimhon in to evaluate patient; plan to restart Heparin gtt this afternoon.  Vital signs: Temp: 98.4 HR: 78 Doppler Pressure:  84 Automatic BP:  89/73 O2 Sat:  95% on RA   Weight: 194>191>196>197>196>197>199>200 lbs  GTTS: Milrinone 0.1 mcg/kg/min Heparin - off 12/04/17  LVAD interrogation reveals:  Speed: 8800 Flow:  4.3 Power:  4.8w PI: 6.9 Alarms: none Events:  none  Fixed speed: 8800 Low speed limit: 8200  Drive Line: Sorbaview dressing dry and intact; anchor intact and accurately applied. Weekly dressing changes; Due 12/08/17. Last changed 12/01/17 by wife.  Labs:  LDH trend: 362>332>270>275>280>269>285>319  INR trend: 2.20>2.12>1.89>1.90>1.75>1.58>1.48>1.33>1.27  Creat trend: 1.46>1.33>2.21>2.46  Anticoagulation Plan: - INR Goal:  2.0 - 2.5 (coumadin on hold post op) - ASA Dose: no ASA (removed 02/2017 due to GI bleed) - Heparin bridge - on hold post op  Device: -Dual [employer] ICD -Therapies: on at 200 bpm - Last device check 10/02/17  Significant Events on VAD Support:  09/01/14>>ureteral stone with cystoscopy and flexible ureteroscopy with lithotripsy and basket extraction 02/2015 >>GIB with 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 Started on  Plavix 3/17>>Lumbar fusion L3-L5 9/17>>VT with DC-CV; amio increased 10/17>>ramp with RV failure; speed decreased to 8800 02/2017> GIB (removed ASA, scopes negative) 03/2017>percutaneous lead repair after pump stops  10/18>>Slow VT. Cardioverted in ED and amiodarone uptitrated 12/18>>melena with scope. Hgb 6.5. EGD with 2 AVMs with APC 12/18>>recurrent hematuria. 6 mm left ureteral stone 1/19>>7 mm left ureteral stone removal with laser lithotripsy; left ureteral stent. Resection of 6 cm bladder tumor   Plan/Recommendations:   1. Dressing change weekly by wife; due 12/08/17. 2. Please call VAD pager if any issues with VAD equipment or questions, concerns.  Zada Girt RN, BSN VAD Coordinator 24/7 Pager [phone]

## 2017-12-06 NOTE — Plan of Care (Signed)
Continue with care plan

## 2017-12-07 DIAGNOSIS — C670 Malignant neoplasm of trigone of bladder: Secondary | ICD-10-CM

## 2017-12-07 DIAGNOSIS — D649 Anemia, unspecified: Secondary | ICD-10-CM

## 2017-12-07 LAB — HEPARIN LEVEL (UNFRACTIONATED): Heparin Unfractionated: <0.1 IU/mL — ABNORMAL LOW (ref 0.30–0.70)

## 2017-12-07 LAB — BASIC METABOLIC PANEL
Anion gap: 9 (ref 5–15)
BUN: 27 mg/dL — AB (ref 6–20)
CHLORIDE: 102 mmol/L (ref 101–111)
CO2: 21 mmol/L — ABNORMAL LOW (ref 22–32)
CREATININE: 2.47 mg/dL — AB (ref 0.61–1.24)
Calcium: 8.4 mg/dL — ABNORMAL LOW (ref 8.9–10.3)
GFR calc Af Amer: 27 mL/min — ABNORMAL LOW (ref >60)
GFR calc non Af Amer: 23 mL/min — ABNORMAL LOW (ref >60)
GLUCOSE: 131 mg/dL — AB (ref 65–99)
Potassium: 4.6 mmol/L (ref 3.5–5.1)
SODIUM: 132 mmol/L — AB (ref 135–145)

## 2017-12-07 LAB — CBC WITH DIFFERENTIAL/PLATELET
Basophils Absolute: 0 10*3/uL (ref 0.0–0.1)
Basophils Relative: 0 %
EOS ABS: 0.1 10*3/uL (ref 0.0–0.7)
EOS PCT: 1 %
HCT: 28.5 % — ABNORMAL LOW (ref 39.0–52.0)
Hemoglobin: 9 g/dL — ABNORMAL LOW (ref 13.0–17.0)
LYMPHS ABS: 0.8 10*3/uL (ref 0.7–4.0)
Lymphocytes Relative: 9 %
MCH: 28.3 pg (ref 26.0–34.0)
MCHC: 31.6 g/dL (ref 30.0–36.0)
MCV: 89.6 fL (ref 78.0–100.0)
MONO ABS: 1.5 10*3/uL — AB (ref 0.1–1.0)
Monocytes Relative: 17 %
Neutro Abs: 6.7 10*3/uL (ref 1.7–7.7)
Neutrophils Relative %: 73 %
Platelets: 158 10*3/uL (ref 150–400)
RBC: 3.18 MIL/uL — AB (ref 4.22–5.81)
RDW: 18.1 % — AB (ref 11.5–15.5)
WBC: 9.1 10*3/uL (ref 4.0–10.5)

## 2017-12-07 LAB — PROTIME-INR
INR: 1.52
Prothrombin Time: 18.2 seconds — ABNORMAL HIGH (ref 11.4–15.2)

## 2017-12-07 LAB — LACTATE DEHYDROGENASE: LDH: 247 U/L — AB (ref 98–192)

## 2017-12-07 MED ORDER — SODIUM CHLORIDE 0.9% FLUSH
10.0000 mL | INTRAVENOUS | Status: DC | PRN
  Administered 2017-12-07 – 2017-12-14 (×4): 10 mL
  Filled 2017-12-07 (×4): qty 40

## 2017-12-07 MED ORDER — WARFARIN SODIUM 2 MG PO TABS
2.0000 mg | ORAL_TABLET | Freq: Once | ORAL | Status: AC
  Administered 2017-12-07: 2 mg via ORAL
  Filled 2017-12-07: qty 1

## 2017-12-07 MED ORDER — FUROSEMIDE 10 MG/ML IJ SOLN
40.0000 mg | Freq: Once | INTRAMUSCULAR | Status: AC
  Administered 2017-12-07: 40 mg via INTRAVENOUS
  Filled 2017-12-07: qty 4

## 2017-12-07 NOTE — Progress Notes (Signed)
ANTICOAGULATION CONSULT NOTE - Follow Up Consult  Pharmacy Consult for Heparin and Warfarin Indication: LVAD  Allergies  Allergen Reactions  . Demerol [Meperidine] Other (See Comments)    Paralysis. Could only move eyes.     Patient Measurements: Height: 6' (182.9 cm) Weight: 197 lb 5 oz (89.5 kg) IBW/kg (Calculated) : 77.6  Vital Signs: Temp: 98.7 F (37.1 C) (02/02 1500) Temp Source: Oral (02/02 1500) BP: 84/71 (02/02 1933) Pulse Rate: 67 (02/02 1500)  Labs: Recent Labs    12/05/17 0241  12/05/17 0943 12/05/17 1016 12/06/17 0326 12/06/17 0327 12/06/17 2221 12/07/17 0212 12/07/17 0732 12/07/17 1744  HGB  --    < > 9.4*  --  9.8*  --   --  9.0*  --   --   HCT  --   --  30.2*  --  31.9*  --   --  28.5*  --   --   PLT  --   --  165  --  200  --   --  158  --   --   LABPROT 16.3*  --   --   --   --  15.8*  --  18.2*  --   --   INR 1.33  --   --   --   --  1.27  --  1.52  --   --   HEPARINUNFRC  --   --   --   --   --   --  <0.10*  --  <0.10* <0.10*  CREATININE  --   --   --  2.21*  --  2.46*  --  2.47*  --   --    < > = values in this interval not displayed.    Estimated Creatinine Clearance: 27.1 mL/min (A) (by C-G formula based on SCr of 2.47 mg/dL (H)).  Assessment: 65 yoM with LVAD on warfarin at home admitted for RHC and ureteroscopy. INR 2.2 on admit, warfarin held, and he was started on heparin once INR < 2.0. S/P cystoscopy + ureteroscopy with stent placement 1/30. Warfarin resumed 1/31. INR 1.27. Heparin to be restarted 2/1at 'low dose' per Dr. Sung Amabile and he would like to aim for a level ~ 0.3.   Initial heparin level undetectable, repeat remains undetectable. Pt previously therapeutic on 1450 units/hr but MD requested conservative dosing with bladder bleeding..  Home Warfarin Dose: 4mg  Tues/2mg  all other days  Goal of Therapy:  INR 2-2.5 Heparin level ~0.3 Monitor platelets by anticoagulation protocol: Yes   Plan:  --Increase heparin to 1500  units/hr -Daily INR, heparin level, CBC -Monitor S/Sx bleeding closely  Thank you Anette Guarneri, PharmD [phone] 12/07/2017

## 2017-12-07 NOTE — Progress Notes (Signed)
Advanced Heart Failure VAD Team Note  Subjective:    Started Rocephin 12/03/17   CT Renal Stone. Stable L ureteral stone non obstructing. Increasing filling defect within the urinary bladder suspicious for an underlying mass. Although this may simply represent a hematoma,urologic consultation and direct visualization is recommended.  Cystocopy 12/04/17  - s/p Left ureteroscopy and stent placement.  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   Feeling poorly today. Very weak. + cough. No fevers or chills. Still with ongoing gross hematuria. U/s last night no hydro. Foley draining well. Back on low-dose heparin with undetectable level.   hgb. 9.4-> 9.0.   INR 1.27. LDH 269 -> 285 -> 319 ->247 LVAD INTERROGATION:  HeartMate II LVAD:  Flow 4.9 liters/min, speed 8800, power 4.9, PI 5.9. Rare PI events.   Objective:    Vital Signs:   Temp:  [98.1 F (36.7 C)-99.7 F (37.6 C)] 98.6 F (37 C) (02/02 0746) Pulse Rate:  [61-76] 67 (02/02 0746) Resp:  [11-21] 11 (02/02 0746) BP: (76-100)/(65-83) 100/83 (02/02 0746) SpO2:  [91 %-93 %] 92 % (02/02 0746) Weight:  [89.5 kg (197 lb 5 oz)] 89.5 kg (197 lb 5 oz) (02/02 0500) Last BM Date: 12/06/17 Mean arterial Pressure 70-80s  Intake/Output:   Intake/Output Summary (Last 24 hours) at 12/07/2017 1007 Last data filed at 12/07/2017 0026 Gross per 24 hour  Intake 585.2 ml  Output 725 ml  Net -139.8 ml     Physical Exam    Physical Exam: General:  Weak appearing. Pale. Lying in bed NAD.  HEENT: normal  Neck: supple. JVP to ear.  Carotids 2+ bilat; no bruits. No lymphadenopathy or thryomegaly appreciated. Cor: LVAD hum.  Lungs: Clear. Abdomen: obese soft, nontender, non-distended. No hepatosplenomegaly. No bruits or masses. Good bowel sounds. Driveline site clean. Anchor in place.  Extremities: no cyanosis, clubbing, rash. Warm trace edema  + foley with gross  hematuria Neuro: alert & oriented x 3. No focal deficits. Moves all 4 without problem    Telemetry   NSR 60-70s + PVCs, personally reviewed.   EKG    No new tracings.    Labs   Basic Metabolic Panel: Recent Labs  Lab 12/01/17 0439  12/03/17 0255 12/04/17 0238 12/05/17 0241 12/05/17 1016 12/06/17 0327 12/07/17 0212  NA 136   < > 135 134*  --  135 134* 132*  K 3.8   < > 4.1 4.1  --  4.6 4.5 4.6  CL 105   < > 106 105  --  107 103 102  CO2 21*   < > 20* 21*  --  19* 19* 21*  GLUCOSE 112*   < > 110* 110*  --  145* 124* 131*  BUN 24*   < > 23* 15  --  24* 27* 27*  CREATININE 1.65*   < > 1.46* 1.33*  --  2.21* 2.46* 2.47*  CALCIUM 8.3*   < > 8.4* 8.3*  --  8.7* 8.8* 8.4*  MG 1.9  --   --   --  2.0  --   --   --    < > = values in this interval not displayed.    Liver Function Tests: No results for input(s): AST, ALT, ALKPHOS, BILITOT, PROT, ALBUMIN in the last 168 hours. No results for input(s): LIPASE, AMYLASE in the last 168 hours. No results for input(s): AMMONIA in the last 168 hours.  CBC: Recent  Labs  Lab 12/03/17 0255 12/04/17 0238 12/05/17 0943 12/06/17 0326 12/07/17 0212  WBC 5.3 5.5 11.6* 13.5* 9.1  NEUTROABS  --   --  10.0* 10.3* 6.7  HGB 8.1* 8.7* 9.4* 9.8* 9.0*  HCT 25.8* 27.6* 30.2* 31.9* 28.5*  MCV 88.7 87.1 88.8 90.4 89.6  PLT 174 193 165 200 158    INR: Recent Labs  Lab 12/03/17 0255 12/04/17 0238 12/05/17 0241 12/06/17 0327 12/07/17 0212  INR 1.58 1.48 1.33 1.27 1.52    Other results:  EKG:    Imaging   US Renal  Result Date: 12/06/2017 CLINICAL DATA:  Renal insufficiency for 1 month. Kidney stone removed and bladder tumor resection on 12/04/2017. History of hypertension, chronic kidney disease, coronary artery disease. EXAM: RENAL / URINARY TRACT ULTRASOUND COMPLETE COMPARISON:  CT abdomen and pelvis 12/03/2017 FINDINGS: Right Kidney: Length: 11.1 cm. Diffuse parenchymal atrophy. Increased parenchymal echotexture consistent with  chronic medical renal disease. Simple appearing cyst off the lower pole right kidney measuring 8.2 cm diameter. A smaller parenchymal cysts centrally measures 2.1 cm diameter. These were present on previous CT study and are consistent with benign cysts. No hydronephrosis. Left Kidney: Length: 12.5 cm. Diffuse parenchymal atrophy. Increased parenchymal echotexture consistent with chronic medical renal disease. Simple appearing cyst off of the lower pole measures 4.1 cm maximal diameter. There is calcification in the wall of the cyst. This was present on the previous CT without change and likely represents a benign cyst. No hydronephrosis. Bladder: Bladder is decompressed with a Foley catheter in place. IMPRESSION: 1. Changes of chronic medical renal disease with bilateral parenchymal atrophy and increased parenchymal echotexture. 2. Bilateral benign-appearing renal cysts. 3. No hydronephrosis. 4. Bladder decompressed with Foley catheter. Electronically Signed   By: Lucienne Capers M.D.   On: 12/06/2017 21:33     Medications:     Scheduled Medications: . amiodarone  200 mg Oral Daily  . atorvastatin  80 mg Oral q1800  . docusate sodium  200 mg Oral Daily  . levothyroxine  150 mcg Oral QAC breakfast  . pantoprazole  40 mg Oral Daily  . ranolazine  500 mg Oral BID  . sildenafil  20 mg Oral TID  . sodium chloride flush  3 mL Intravenous Q12H  . tamsulosin  0.4 mg Oral Daily  . warfarin  2 mg Oral ONCE-1800  . Warfarin - Pharmacist Dosing Inpatient   Does not apply q1800    Infusions: . sodium chloride Stopped (12/04/17 1330)  . sodium chloride    . cefTRIAXone (ROCEPHIN)  IV 1 g (12/07/17 0923)  . heparin 1,000 Units/hr (12/07/17 0746)  . lactated ringers 0 mL/hr at 12/04/17 1226  . milrinone 0.1 mcg/kg/min (12/06/17 1900)    PRN Medications: sodium chloride, acetaminophen, acetaminophen, guaiFENesin, ondansetron (ZOFRAN) IV, ondansetron (ZOFRAN) IV, sodium chloride flush   VAD EVENTS     GU Event 09/01/14 had impacted ureteral stone and underwent cystoscopy and flexible ureteroscopy with lithotripsy and basket extraction.   09/2015 Hematuria and chronic nephrolithiasis. Renal US showed mild hyronephrosis and bilateral renal cysts. Dr. Risa Grill took back for repeat lithotripsy and stone extraction by ureteroscopy and J stent placement. 12/18 Recurrent hematuria. 68mm left ureteral stone. 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  4/2-18 - Episode of melena. Hgb 10.9-> 8.7. EGD/colonoscopy negative 12/18 - Melena with syncope  Hgb 6.5. Transfused 5u. 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  01/2016 R CEA. He was started on Plavix.  01/2016 Lumbar fusion L3-L5 with pedicle screws posterolateral arthrodesis with BMP, allograft  Patient Profile  [name] is a 77 y.o. 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 Required short term milrinone.   Admitted 1/25 for elective ureteroscopy due to R ureteral stone.  Still with occasional hematuria. Continues with fatigue. RHC 1/25 showed low output due to RV failure (see below). Milrinone initiated at 0.125.  Assessment/Plan:    1. 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 Struggling with progressive NYHA III symptoms.  RHC 1/25 showed primarily RHF with low output.  - No real symptomatic improvement with milrinone. Continue for now at 0.1 with AKI.  - Volume status elevated. Give lasix 40mg  IV x 1 - Continue Sildenafil 20 mg TID with RV failure.  - Continue VAD speed at 8800. VAD interrogated personally. Parameters stable.  2. Recurrent renal stone with ongoing hematuria and UTI: Left ureteral stone in  12/18. - Last dose of coumadin 1-24/2019 - 12/03/2017. CT with L stone and possible mass - Cystoscopy + Left Ureteroscopy with stent placed 12/04/17. - Started rocephin 12/03/17. Ucx negative.  - Still with gross hematuria. Hgb trending down slowly. Transfuse as needed - D/w Dr. Gloriann Loan expects some hematuria with stent. Wil remove stent at bedside on Monday. Can continue heparin/warfarin 3. Bladder Tumor - Seen on CT 1/29 and confirmed/resected on cystoscopy 12/04/17. - Pathology c/w high-grade urothelial CA. Discussed with Dr. Gloriann Loan. Plan outpatient BCG tretment 4. 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 Continue coumadin and plavix (with h/o CVA). Admitted 4/18 with recurrent GIB. Transfused 2u. EGD/colon negative. Admitted 12/18, Hgb 6.5, Transfused 5u. EGD with 2 AVMs treated with APC - Hgb drifting down slowly in setting of ongoing hematuria 5.  Atrial arrhythmias: Paroxysmal atrial fibrillation, atrial tachycardia.  S/p DC-CV 06/04/14, DC-CV 03/02/15 and 01/19/16.  - remains in NSR.  - Continue amiodarone.  6. HTN:  - MAPs stable.  7. LVAD:  - VAD interrogated personally. Parameters stable. - LDH ok - on low-dose heparin and coumadin Increase heparin as hematuria slows  8. AKI on CKD, stage III:  - Creatinine elevated post op. Continue milrinone for today.  - Creatinine now plateaued. U/s without hydro.  9. 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 No further.  - Continue atorvastatin. (also warfarin INR goal 2-2.5). S/p R CEA 01/13/16.  - Plavix stopped 12/18 due to recurrent GIB 10. H/o amio-induced hypothyroidism: Amio restarted due to recurrent atrial arrhythmias and VT.  - Continue home levothyroxine dose. No change.  11. VT:  - Underwent DC-CV 9/17. Repeat episode of slow VT 08/17/17 requiring DC-CV.  - Continue amio 200 daily.  - Goal K > 4.0 Mg > 2.0. Stable.   I reviewed the LVAD parameters from today, and compared the results to the patient's prior recorded data.  No  programming changes were made.  The LVAD is functioning within specified parameters.  The patient performs LVAD self-test daily.  LVAD interrogation was negative for any significant power changes, alarms or PI events/speed drops.  LVAD equipment check completed and is in good working order.  Back-up equipment present.   LVAD education done on emergency procedures and  precautions and reviewed exit site care.   Length of Stay: 8  Glori Bickers, MD 12/07/2017, 10:07 AM  VAD Team --- VAD ISSUES ONLY--- Pager [phone] (7am - 7am)  Advanced Heart Failure Team  Pager [phone] (M-F; 7a - 4p)   Please contact [facility] for night-coverage after hours (4p -7a ) and weekends on [email or web address]

## 2017-12-07 NOTE — Progress Notes (Signed)
ANTICOAGULATION CONSULT NOTE - Follow Up Consult  Pharmacy Consult for Heparin and Warfarin Indication: LVAD  Allergies  Allergen Reactions  . Demerol [Meperidine] Other (See Comments)    Paralysis. Could only move eyes.     Patient Measurements: Height: 6' (182.9 cm) Weight: 197 lb 5 oz (89.5 kg) IBW/kg (Calculated) : 77.6  Vital Signs: Temp: 98.6 F (37 C) (02/02 0746) Temp Source: Oral (02/02 0746) BP: 100/83 (02/02 0746) Pulse Rate: 67 (02/02 0746)  Labs: Recent Labs    12/05/17 0241  12/05/17 0943 12/05/17 1016 12/06/17 0326 12/06/17 0327 12/06/17 2221 12/07/17 0212  HGB  --    < > 9.4*  --  9.8*  --   --  9.0*  HCT  --   --  30.2*  --  31.9*  --   --  28.5*  PLT  --   --  165  --  200  --   --  158  LABPROT 16.3*  --   --   --   --  15.8*  --  18.2*  INR 1.33  --   --   --   --  1.27  --  1.52  HEPARINUNFRC  --   --   --   --   --   --  <0.10*  --   CREATININE  --   --   --  2.21*  --  2.46*  --  2.47*   < > = values in this interval not displayed.    Estimated Creatinine Clearance: 27.1 mL/min (A) (by C-G formula based on SCr of 2.47 mg/dL (H)).  Assessment: 40 yoM with LVAD on warfarin at home admitted for RHC and ureteroscopy. INR 2.2 on admit, warfarin held, and he was started on heparin once INR < 2.0. S/P cystoscopy + ureteroscopy with stent placement 1/30. Warfarin resumed 1/31. INR 1.27. Heparin to be restarted 2/1at 'low dose' per Dr. Sung Amabile and he would like to aim for a level ~ 0.3.   Initial heparin level undetectable, repeat remains undetectable. Pt previously therapeutic on 1450 units/hr but MD requested conservative dosing with bladder bleeding. Per RN, bleeding remains stable at this time and there are no issues with infusion. INR up to 1.52, Hgb low but stable.  Home Warfarin Dose: 4mg  Tues/2mg  all other days  Goal of Therapy:  INR 2-2.5 Heparin level ~0.3 Monitor platelets by anticoagulation protocol: Yes   Plan:  -Warfarin 2mg  PO  x1 tonight -Increase heparin to 1300 units/hr -Daily INR, heparin level, CBC -Monitor S/Sx bleeding closely  Arrie Senate, PharmD, BCPS PGY-2 Cardiology Pharmacy Resident Pager: [phone] 12/07/2017

## 2017-12-07 NOTE — Progress Notes (Signed)
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   He is doing well today but has persistent hematuria without clots.  His Hgb is down to 9.0 from 9.8.   A renal US on 2/1 showed medical renal disease without hydronephrosis but his Cr continues to climb and is up to 2.47.   ROS:  Review of Systems  Constitutional: Negative for fever.  Cardiovascular: Negative for chest pain.  Gastrointestinal: Negative for abdominal pain.    Anti-infectives: Anti-infectives (From admission, onward)   Start     Dose/Rate Route Frequency Ordered Stop   12/04/17 1045  ceFAZolin (ANCEF) IVPB 2g/100 mL premix     2 g 200 mL/hr over 30 Minutes Intravenous To Surgery 12/03/17 2049 12/04/17 1145   12/03/17 1000  cefTRIAXone (ROCEPHIN) 1 g in dextrose 5 % 50 mL IVPB     1 g 100 mL/hr over 30 Minutes Intravenous Every 24 hours 12/03/17 0929        Current Facility-Administered Medications  Medication Dose Route Frequency Provider Last Rate Last Dose  . 0.9 %  sodium chloride infusion  250 mL Intravenous PRN Bensimhon, Shaune Pascal, MD   Stopped at 12/04/17 1330  . 0.9 %  sodium chloride infusion   Intravenous Once Kyung Rudd, CRNA      . acetaminophen (TYLENOL) tablet 650 mg  650 mg Oral Q4H PRN Bensimhon, Shaune Pascal, MD      . acetaminophen (TYLENOL) tablet 650 mg  650 mg Oral Q4H PRN Clegg, Amy D, NP      . amiodarone (PACERONE) tablet 200 mg  200 mg Oral Daily Larey Dresser, MD   200 mg at 12/06/17 4696  . atorvastatin (LIPITOR) tablet 80 mg  80 mg Oral q1800 Larey Dresser, MD   80 mg at 12/06/17 1642  . cefTRIAXone (ROCEPHIN) 1 g in dextrose 5 % 50 mL IVPB  1 g Intravenous Q24H Bensimhon, Shaune Pascal, MD   Stopped at 12/06/17 1209  . docusate sodium (COLACE) capsule 200 mg  200 mg Oral Daily Larey Dresser, MD   200 mg at 12/06/17 2952  . guaiFENesin (ROBITUSSIN) 100 MG/5ML solution 100 mg  5 mL Oral Q4H PRN Bensimhon, Shaune Pascal, MD    100 mg at 12/06/17 2206  . heparin ADULT infusion 100 units/mL (25000 units/284mL sodium chloride 0.45%)  1,000 Units/hr Intravenous Continuous Erenest Blank, RPH 10 mL/hr at 12/07/17 0746 1,000 Units/hr at 12/07/17 0746  . lactated ringers infusion   Intravenous Continuous Effie Berkshire, MD 0 mL/hr at 12/04/17 1226    . levothyroxine (SYNTHROID, LEVOTHROID) tablet 150 mcg  150 mcg Oral QAC breakfast Einar Grad, RPH   150 mcg at 12/06/17 8413  . milrinone (PRIMACOR) 20 MG/100 ML (0.2 mg/mL) infusion  0.1 mcg/kg/min Intravenous Continuous Bensimhon, Shaune Pascal, MD 2.6 mL/hr at 12/06/17 1900 0.1 mcg/kg/min at 12/06/17 1900  . ondansetron (ZOFRAN) injection 4 mg  4 mg Intravenous Q6H PRN Bensimhon, Shaune Pascal, MD      . ondansetron (ZOFRAN) injection 4 mg  4 mg Intravenous Q6H PRN Clegg, Amy D, NP      . pantoprazole (PROTONIX) EC tablet 40 mg  40 mg Oral Daily Larey Dresser, MD   40 mg at 12/06/17 2440  . ranolazine (RANEXA) 12 hr tablet 500 mg  500 mg Oral BID Larey Dresser, MD   500 mg at 12/06/17 2203  .  sildenafil (REVATIO) tablet 20 mg  20 mg Oral TID Bensimhon, Shaune Pascal, MD   20 mg at 12/06/17 2203  . sodium chloride flush (NS) 0.9 % injection 3 mL  3 mL Intravenous Q12H Bensimhon, Shaune Pascal, MD   3 mL at 12/06/17 2207  . sodium chloride flush (NS) 0.9 % injection 3 mL  3 mL Intravenous PRN Bensimhon, Shaune Pascal, MD      . tamsulosin (FLOMAX) capsule 0.4 mg  0.4 mg Oral Daily Larey Dresser, MD   0.4 mg at 12/06/17 9485  . Warfarin - Pharmacist Dosing Inpatient   Does not apply q1800 Larey Dresser, MD         Objective: Vital signs in last 24 hours: Temp:  [98.1 F (36.7 C)-99.7 F (37.6 C)] 98.6 F (37 C) (02/02 0746) Pulse Rate:  [61-76] 67 (02/02 0746) Resp:  [11-21] 11 (02/02 0746) BP: (76-100)/(65-83) 100/83 (02/02 0746) SpO2:  [91 %-93 %] 92 % (02/02 0746) Weight:  [197 lb 5 oz (89.5 kg)] 197 lb 5 oz (89.5 kg) (02/02 0500)  Intake/Output from previous  day: 02/01 0701 - 02/02 0700 In: 755.2 [P.O.:320; I.V.:60.2; IV Piggyback:50] Out: 725 [Urine:725] Intake/Output this shift: No intake/output data recorded.   Physical Exam  Constitutional: He is oriented to person, place, and time and well-developed, well-nourished, and in no distress.  Cardiovascular: Normal rate and regular rhythm.  Pulmonary/Chest: No respiratory distress.  Genitourinary:  Genitourinary Comments: Foley is draining dark bloody urine without clots.   Neurological: He is alert and oriented to person, place, and time.  Vitals reviewed.   Lab Results:  Recent Labs    12/06/17 0326 12/07/17 0212  WBC 13.5* 9.1  HGB 9.8* 9.0*  HCT 31.9* 28.5*  PLT 200 158   BMET Recent Labs    12/06/17 0327 12/07/17 0212  NA 134* 132*  K 4.5 4.6  CL 103 102  CO2 19* 21*  GLUCOSE 124* 131*  BUN 27* 27*  CREATININE 2.46* 2.47*  CALCIUM 8.8* 8.4*   PT/INR Recent Labs    12/06/17 0327 12/07/17 0212  LABPROT 15.8* 18.2*  INR 1.27 1.52   ABG No results for input(s): PHART, HCO3 in the last 72 hours.  Invalid input(s): PCO2, PO2  Studies/Results: US Renal  Result Date: 12/06/2017 CLINICAL DATA:  Renal insufficiency for 1 month. Kidney stone removed and bladder tumor resection on 12/04/2017. History of hypertension, chronic kidney disease, coronary artery disease. EXAM: RENAL / URINARY TRACT ULTRASOUND COMPLETE COMPARISON:  CT abdomen and pelvis 12/03/2017 FINDINGS: Right Kidney: Length: 11.1 cm. Diffuse parenchymal atrophy. Increased parenchymal echotexture consistent with chronic medical renal disease. Simple appearing cyst off the lower pole right kidney measuring 8.2 cm diameter. A smaller parenchymal cysts centrally measures 2.1 cm diameter. These were present on previous CT study and are consistent with benign cysts. No hydronephrosis. Left Kidney: Length: 12.5 cm. Diffuse parenchymal atrophy. Increased parenchymal echotexture consistent with chronic medical renal  disease. Simple appearing cyst off of the lower pole measures 4.1 cm maximal diameter. There is calcification in the wall of the cyst. This was present on the previous CT without change and likely represents a benign cyst. No hydronephrosis. Bladder: Bladder is decompressed with a Foley catheter in place. IMPRESSION: 1. Changes of chronic medical renal disease with bilateral parenchymal atrophy and increased parenchymal echotexture. 2. Bilateral benign-appearing renal cysts. 3. No hydronephrosis. 4. Bladder decompressed with Foley catheter. Electronically Signed   By: Lucienne Capers M.D.   On: 12/06/2017  21:33     Assessment and Plan: Bladder cancer with right ureteral obstruction doing well post resection.  His urine remains bloody the foley is draining well.  There is no hydro on [place] on 2/1.  Continue foley drainage.   AKI.  The Cr continues to rise but there is no obstruction.     Acute blood loss anemia.  The Hgb has drifted down to 9.0.   Continue to monitor.       LOS: 8 days    Irine Seal 12/07/2017 [phone]

## 2017-12-08 ENCOUNTER — Inpatient Hospital Stay (HOSPITAL_COMMUNITY): Payer: Medicare Other

## 2017-12-08 DIAGNOSIS — D494 Neoplasm of unspecified behavior of bladder: Secondary | ICD-10-CM

## 2017-12-08 LAB — CBC WITH DIFFERENTIAL/PLATELET
Basophils Absolute: 0 10*3/uL (ref 0.0–0.1)
Basophils Relative: 0 %
Eosinophils Absolute: 0.1 10*3/uL (ref 0.0–0.7)
Eosinophils Relative: 2 %
HEMATOCRIT: 26.6 % — AB (ref 39.0–52.0)
HEMOGLOBIN: 8.5 g/dL — AB (ref 13.0–17.0)
LYMPHS ABS: 0.5 10*3/uL — AB (ref 0.7–4.0)
LYMPHS PCT: 6 %
MCH: 28.4 pg (ref 26.0–34.0)
MCHC: 32 g/dL (ref 30.0–36.0)
MCV: 89 fL (ref 78.0–100.0)
MONOS PCT: 20 %
Monocytes Absolute: 1.6 10*3/uL — ABNORMAL HIGH (ref 0.1–1.0)
NEUTROS PCT: 72 %
Neutro Abs: 5.9 10*3/uL (ref 1.7–7.7)
Platelets: 146 10*3/uL — ABNORMAL LOW (ref 150–400)
RBC: 2.99 MIL/uL — AB (ref 4.22–5.81)
RDW: 18.1 % — ABNORMAL HIGH (ref 11.5–15.5)
WBC: 8.1 10*3/uL (ref 4.0–10.5)

## 2017-12-08 LAB — BASIC METABOLIC PANEL
Anion gap: 11 (ref 5–15)
BUN: 40 mg/dL — ABNORMAL HIGH (ref 6–20)
CHLORIDE: 100 mmol/L — AB (ref 101–111)
CO2: 21 mmol/L — AB (ref 22–32)
CREATININE: 3.92 mg/dL — AB (ref 0.61–1.24)
Calcium: 8.2 mg/dL — ABNORMAL LOW (ref 8.9–10.3)
GFR calc non Af Amer: 13 mL/min — ABNORMAL LOW (ref >60)
GFR, EST AFRICAN AMERICAN: 16 mL/min — AB (ref >60)
Glucose, Bld: 123 mg/dL — ABNORMAL HIGH (ref 65–99)
POTASSIUM: 4.5 mmol/L (ref 3.5–5.1)
Sodium: 132 mmol/L — ABNORMAL LOW (ref 135–145)

## 2017-12-08 LAB — HEPARIN LEVEL (UNFRACTIONATED): Heparin Unfractionated: 0.28 IU/mL — ABNORMAL LOW (ref 0.30–0.70)

## 2017-12-08 LAB — PROTIME-INR
INR: 1.9
Prothrombin Time: 21.6 seconds — ABNORMAL HIGH (ref 11.4–15.2)

## 2017-12-08 LAB — LACTATE DEHYDROGENASE: LDH: 250 U/L — ABNORMAL HIGH (ref 98–192)

## 2017-12-08 LAB — PREPARE RBC (CROSSMATCH)

## 2017-12-08 MED ORDER — SODIUM CHLORIDE 0.9 % IV SOLN
Freq: Once | INTRAVENOUS | Status: AC
  Administered 2017-12-08: 10:00:00 via INTRAVENOUS

## 2017-12-08 MED ORDER — WARFARIN SODIUM 3 MG PO TABS
3.0000 mg | ORAL_TABLET | Freq: Once | ORAL | Status: AC
Start: 2017-12-08 — End: 2017-12-08
  Administered 2017-12-08: 3 mg via ORAL
  Filled 2017-12-08: qty 1

## 2017-12-08 NOTE — Progress Notes (Addendum)
4 Days Post-Op  Subjective: CC: Hematuria.  Hx: [name] continues to have bloody urine without clots and his Hgb has declined to 8.5.  He has no flank or SP pain.  His Cr has jumped to 3.92 and a renal US has been ordered although the postop study showed no obstruction.   He is s/p TURBT and left ureteroscopy with stenting for bladder mass and left ureteral stone.   ROS:  Review of Systems  Constitutional: Negative for fever.  Gastrointestinal: Negative for abdominal pain and nausea.  Genitourinary: Positive for hematuria. Negative for flank pain.    Anti-infectives: Anti-infectives (From admission, onward)   Start     Dose/Rate Route Frequency Ordered Stop   12/04/17 1045  ceFAZolin (ANCEF) IVPB 2g/100 mL premix     2 g 200 mL/hr over 30 Minutes Intravenous To Surgery 12/03/17 2049 12/04/17 1145   12/03/17 1000  cefTRIAXone (ROCEPHIN) 1 g in dextrose 5 % 50 mL IVPB     1 g 100 mL/hr over 30 Minutes Intravenous Every 24 hours 12/03/17 0929        Current Facility-Administered Medications  Medication Dose Route Frequency Provider Last Rate Last Dose  . 0.9 %  sodium chloride infusion  250 mL Intravenous PRN Bensimhon, Shaune Pascal, MD   Stopped at 12/04/17 1330  . 0.9 %  sodium chloride infusion   Intravenous Once Kyung Rudd, CRNA      . acetaminophen (TYLENOL) tablet 650 mg  650 mg Oral Q4H PRN Bensimhon, Shaune Pascal, MD      . acetaminophen (TYLENOL) tablet 650 mg  650 mg Oral Q4H PRN Clegg, Amy D, NP      . amiodarone (PACERONE) tablet 200 mg  200 mg Oral Daily Larey Dresser, MD   200 mg at 12/08/17 0919  . atorvastatin (LIPITOR) tablet 80 mg  80 mg Oral q1800 Larey Dresser, MD   80 mg at 12/07/17 1755  . cefTRIAXone (ROCEPHIN) 1 g in dextrose 5 % 50 mL IVPB  1 g Intravenous Q24H Bensimhon, Shaune Pascal, MD   Stopped at 12/07/17 1022  . docusate sodium (COLACE) capsule 200 mg  200 mg Oral Daily Larey Dresser, MD   200 mg at 12/08/17 0920  . guaiFENesin (ROBITUSSIN) 100  MG/5ML solution 100 mg  5 mL Oral Q4H PRN Bensimhon, Shaune Pascal, MD   100 mg at 12/07/17 2120  . lactated ringers infusion   Intravenous Continuous Effie Berkshire, MD 0 mL/hr at 12/04/17 1226    . levothyroxine (SYNTHROID, LEVOTHROID) tablet 150 mcg  150 mcg Oral QAC breakfast Einar Grad, RPH   150 mcg at 12/08/17 6644  . milrinone (PRIMACOR) 20 MG/100 ML (0.2 mg/mL) infusion  0.1 mcg/kg/min Intravenous Continuous Bensimhon, Shaune Pascal, MD 2.6 mL/hr at 12/08/17 0700 0.1 mcg/kg/min at 12/08/17 0700  . ondansetron (ZOFRAN) injection 4 mg  4 mg Intravenous Q6H PRN Bensimhon, Shaune Pascal, MD      . ondansetron [facility]) injection 4 mg  4 mg Intravenous Q6H PRN Clegg, Amy D, NP   4 mg at 12/07/17 1524  . pantoprazole (PROTONIX) EC tablet 40 mg  40 mg Oral Daily Larey Dresser, MD   40 mg at 12/08/17 0919  . ranolazine (RANEXA) 12 hr tablet 500 mg  500 mg Oral BID Larey Dresser, MD   500 mg at 12/08/17 0919  . sildenafil (REVATIO) tablet 20 mg  20 mg Oral TID Bensimhon, Shaune Pascal, MD   20  mg at 12/08/17 0919  . sodium chloride flush (NS) 0.9 % injection 10-40 mL  10-40 mL Intracatheter PRN Bensimhon, Shaune Pascal, MD   10 mL at 12/07/17 2121  . sodium chloride flush (NS) 0.9 % injection 3 mL  3 mL Intravenous Q12H Bensimhon, Shaune Pascal, MD   3 mL at 12/08/17 0921  . sodium chloride flush (NS) 0.9 % injection 3 mL  3 mL Intravenous PRN Bensimhon, Shaune Pascal, MD      . tamsulosin (FLOMAX) capsule 0.4 mg  0.4 mg Oral Daily Larey Dresser, MD   0.4 mg at 12/08/17 0919  . warfarin (COUMADIN) tablet 3 mg  3 mg Oral ONCE-1800 Carney, Gay Filler, RPH      . Warfarin - Pharmacist Dosing Inpatient   Does not apply q1800 Larey Dresser, MD         Objective: Vital signs in last 24 hours: Temp:  [98.2 F (36.8 C)-98.7 F (37.1 C)] 98.2 F (36.8 C) (02/03 0727) Pulse Rate:  [57-72] 64 (02/03 0727) Resp:  [11-20] 11 (02/03 0727) BP: (81-98)/(50-86) 86/75 (02/03 0727) SpO2:  [90 %-98 %] 94 % (02/03  0727) Weight:  [88.5 kg (195 lb 3.2 oz)] 88.5 kg (195 lb 3.2 oz) (02/03 0342)  Intake/Output from previous day: 02/02 0701 - 02/03 0700 In: 123 [I.V.:123] Out: 1025 [Urine:1025] Intake/Output this shift: No intake/output data recorded.   Physical Exam  Constitutional: He is well-developed, well-nourished, and in no distress.  Genitourinary:  Genitourinary Comments: Urine remains dark without clots.   Vitals reviewed.   Lab Results:  Recent Labs    12/07/17 0212 12/08/17 0505  WBC 9.1 8.1  HGB 9.0* 8.5*  HCT 28.5* 26.6*  PLT 158 146*   BMET Recent Labs    12/07/17 0212 12/08/17 0505  NA 132* 132*  K 4.6 4.5  CL 102 100*  CO2 21* 21*  GLUCOSE 131* 123*  BUN 27* 40*  CREATININE 2.47* 3.92*  CALCIUM 8.4* 8.2*   PT/INR Recent Labs    12/07/17 0212 12/08/17 0505  LABPROT 18.2* 21.6*  INR 1.52 1.90   ABG No results for input(s): PHART, HCO3 in the last 72 hours.  Invalid input(s): PCO2, PO2  Studies/Results: US Renal  Result Date: 12/06/2017 CLINICAL DATA:  Renal insufficiency for 1 month. Kidney stone removed and bladder tumor resection on 12/04/2017. History of hypertension, chronic kidney disease, coronary artery disease. EXAM: RENAL / URINARY TRACT ULTRASOUND COMPLETE COMPARISON:  CT abdomen and pelvis 12/03/2017 FINDINGS: Right Kidney: Length: 11.1 cm. Diffuse parenchymal atrophy. Increased parenchymal echotexture consistent with chronic medical renal disease. Simple appearing cyst off the lower pole right kidney measuring 8.2 cm diameter. A smaller parenchymal cysts centrally measures 2.1 cm diameter. These were present on previous CT study and are consistent with benign cysts. No hydronephrosis. Left Kidney: Length: 12.5 cm. Diffuse parenchymal atrophy. Increased parenchymal echotexture consistent with chronic medical renal disease. Simple appearing cyst off of the lower pole measures 4.1 cm maximal diameter. There is calcification in the wall of the cyst.  This was present on the previous CT without change and likely represents a benign cyst. No hydronephrosis. Bladder: Bladder is decompressed with a Foley catheter in place. IMPRESSION: 1. Changes of chronic medical renal disease with bilateral parenchymal atrophy and increased parenchymal echotexture. 2. Bilateral benign-appearing renal cysts. 3. No hydronephrosis. 4. Bladder decompressed with Foley catheter. Electronically Signed   By: Lucienne Capers M.D.   On: 12/06/2017 21:33   Labs reviewed.  Assessment and Plan: Hx of bladder tumor s/p resection with persistent hematuria on coumadin.  He has a progressive anemia with a hgb down to 8.5.   I am going to start CBI carefully to see if flushing the bladder will help the bleeding stop.  If the hematuria persists, he may need have the warfarin held and return to OR for cauterization if safe from a cardiac standpoint.   AKI with worsening Cr.   A renal US has been ordered.   It was negative for hydro on a post TURBT study and he has no flank pain so the progressive AKI is not likely from obstruction.   Addendum:   The renal US showed moderate left hydronephrosis which suggests stent obstruction.  There was no right hydro and the bladder was decompressed without obvious clot.    I came in and pulled the stent which was supposed to come out in the next day or so anyway.   If the Cr doesn't improve or if he has significant flank pain, he may need another [place] to make sure the left kidney is decompressed and he doesn't need another stent.    LOS: 9 days    Irine Seal 12/08/2017 [phone] ID: [name], male   DOB: [birth year], 77 y.o.   MRN: [id number]

## 2017-12-08 NOTE — Progress Notes (Addendum)
ANTICOAGULATION CONSULT NOTE - Follow Up Consult  Pharmacy Consult for Heparin and Warfarin Indication: LVAD  Allergies  Allergen Reactions  . Demerol [Meperidine] Other (See Comments)    Paralysis. Could only move eyes.     Patient Measurements: Height: 6' (182.9 cm) Weight: 195 lb 3.2 oz (88.5 kg) IBW/kg (Calculated) : 77.6  Vital Signs: Temp: 98.2 F (36.8 C) (02/03 0727) Temp Source: Oral (02/03 0727) BP: 86/75 (02/03 0727) Pulse Rate: 64 (02/03 0727)  Labs: Recent Labs    12/06/17 0326 12/06/17 0327  12/07/17 0212 12/07/17 0732 12/07/17 1744 12/08/17 0505  HGB 9.8*  --   --  9.0*  --   --  8.5*  HCT 31.9*  --   --  28.5*  --   --  26.6*  PLT 200  --   --  158  --   --  146*  LABPROT  --  15.8*  --  18.2*  --   --  21.6*  INR  --  1.27  --  1.52  --   --  1.90  HEPARINUNFRC  --   --    < >  --  <0.10* <0.10* 0.28*  CREATININE  --  2.46*  --  2.47*  --   --  3.92*   < > = values in this interval not displayed.    Estimated Creatinine Clearance: 17 mL/min (A) (by C-G formula based on SCr of 3.92 mg/dL (H)).  Assessment: 29 yoM with LVAD on warfarin at home admitted for RHC and ureteroscopy. INR 2.2 on admit, warfarin held, and he was started on heparin once INR < 2.0. S/P cystoscopy + ureteroscopy with stent placement 1/30. Warfarin resumed 1/31.  INR 1.9, heparin level at low goal (0.28), heparin drip stopped today.  Hgb trending down.  Still with gross hematuria, getting PRBCs today.  Home Warfarin Dose: 4mg  Tues/2mg  all other days  Goal of Therapy:  INR 2-2.5 Monitor platelets by anticoagulation protocol: Yes   Plan:  -Warfarin 3 mg PO x1 tonight -Daily INR, CBC -Monitor S/Sx bleeding closely  Uvaldo Rising, BCPS  Clinical Pharmacist Pager [phone]  12/08/2017 8:59 AM

## 2017-12-08 NOTE — Progress Notes (Addendum)
Advanced Heart Failure VAD Team Note  Subjective:    Started Rocephin 12/03/17   CT Renal Stone. Stable L ureteral stone non obstructing. Increasing filling defect within the urinary bladder suspicious for an underlying mass. Although this may simply represent a hematoma,urologic consultation and direct visualization is recommended.  Cystocopy 12/04/17  - s/p Left ureteroscopy and stent placement.  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   Received lasix yesterday. Weight down 2 pounds. Still feels weak. No SOB. Still with gross hematuria. Remains on heparin/warfarin. Heparin level 0.28. INR 1.90  MAPs have been stable in 80s  hgb. 9.4-> 9.0.-> 8.5  LDH 269 -> 285 -> 319 ->247-> 250 LVAD INTERROGATION:  HeartMate II LVAD:  Flow 4.7 liters/min, speed 8800, power 4.9, PI 6.5. Rare PI events.   Objective:    Vital Signs:   Temp:  [98.2 F (36.8 C)-98.7 F (37.1 C)] 98.2 F (36.8 C) (02/03 0727) Pulse Rate:  [57-72] 64 (02/03 0727) Resp:  [11-20] 11 (02/03 0727) BP: (81-98)/(50-86) 86/75 (02/03 0727) SpO2:  [90 %-98 %] 94 % (02/03 0727) Weight:  [88.5 kg (195 lb 3.2 oz)] 88.5 kg (195 lb 3.2 oz) (02/03 0342) Last BM Date: 12/06/17 Mean arterial Pressure 80s  Intake/Output:   Intake/Output Summary (Last 24 hours) at 12/08/2017 0752 Last data filed at 12/07/2017 2018 Gross per 24 hour  Intake 108 ml  Output 1025 ml  Net -917 ml     Physical Exam    Physical Exam: General:  Sitting in bed. Weak appearing  HEENT: normal  Neck: supple. JVP 8-9.  Carotids 2+ bilat; no bruits. No lymphadenopathy or thryomegaly appreciated. Cor: LVAD hum.  Lungs: Clear. Abdomen: soft, nontender, non-distended. No hepatosplenomegaly. No bruits or masses. Good bowel sounds. Driveline site clean. Anchor in place.  Extremities: no cyanosis, clubbing, rash. Warm no trace edema    Neuro: alert & oriented x 3. No focal  deficits. Moves all 4 without problem  Foley with gross hematuria   Telemetry   NSR 60s + PVCs, personally reviewed.   EKG    No new tracings.    Labs   Basic Metabolic Panel: Recent Labs  Lab 12/04/17 0238 12/05/17 0241 12/05/17 1016 12/06/17 0327 12/07/17 0212 12/08/17 0505  NA 134*  --  135 134* 132* 132*  K 4.1  --  4.6 4.5 4.6 4.5  CL 105  --  107 103 102 100*  CO2 21*  --  19* 19* 21* 21*  GLUCOSE 110*  --  145* 124* 131* 123*  BUN 15  --  24* 27* 27* 40*  CREATININE 1.33*  --  2.21* 2.46* 2.47* 3.92*  CALCIUM 8.3*  --  8.7* 8.8* 8.4* 8.2*  MG  --  2.0  --   --   --   --     Liver Function Tests: No results for input(s): AST, ALT, ALKPHOS, BILITOT, PROT, ALBUMIN in the last 168 hours. No results for input(s): LIPASE, AMYLASE in the last 168 hours. No results for input(s): AMMONIA in the last 168 hours.  CBC: Recent Labs  Lab 12/04/17 0238 12/05/17 0943 12/06/17 0326 12/07/17 0212 12/08/17 0505  WBC 5.5 11.6* 13.5* 9.1 8.1  NEUTROABS  --  10.0* 10.3* 6.7 5.9  HGB 8.7* 9.4* 9.8* 9.0* 8.5*  HCT 27.6* 30.2* 31.9* 28.5* 26.6*  MCV 87.1 88.8 90.4 89.6 89.0  PLT 193 165 200 158 146*    INR: Recent  Labs  Lab 12/04/17 0238 12/05/17 0241 12/06/17 0327 12/07/17 0212 12/08/17 0505  INR 1.48 1.33 1.27 1.52 1.90    Other results:  EKG:    Imaging   US Renal  Result Date: 12/06/2017 CLINICAL DATA:  Renal insufficiency for 1 month. Kidney stone removed and bladder tumor resection on 12/04/2017. History of hypertension, chronic kidney disease, coronary artery disease. EXAM: RENAL / URINARY TRACT ULTRASOUND COMPLETE COMPARISON:  CT abdomen and pelvis 12/03/2017 FINDINGS: Right Kidney: Length: 11.1 cm. Diffuse parenchymal atrophy. Increased parenchymal echotexture consistent with chronic medical renal disease. Simple appearing cyst off the lower pole right kidney measuring 8.2 cm diameter. A smaller parenchymal cysts centrally measures 2.1 cm diameter.  These were present on previous CT study and are consistent with benign cysts. No hydronephrosis. Left Kidney: Length: 12.5 cm. Diffuse parenchymal atrophy. Increased parenchymal echotexture consistent with chronic medical renal disease. Simple appearing cyst off of the lower pole measures 4.1 cm maximal diameter. There is calcification in the wall of the cyst. This was present on the previous CT without change and likely represents a benign cyst. No hydronephrosis. Bladder: Bladder is decompressed with a Foley catheter in place. IMPRESSION: 1. Changes of chronic medical renal disease with bilateral parenchymal atrophy and increased parenchymal echotexture. 2. Bilateral benign-appearing renal cysts. 3. No hydronephrosis. 4. Bladder decompressed with Foley catheter. Electronically Signed   By: Lucienne Capers M.D.   On: 12/06/2017 21:33     Medications:     Scheduled Medications: . amiodarone  200 mg Oral Daily  . atorvastatin  80 mg Oral q1800  . docusate sodium  200 mg Oral Daily  . levothyroxine  150 mcg Oral QAC breakfast  . pantoprazole  40 mg Oral Daily  . ranolazine  500 mg Oral BID  . sildenafil  20 mg Oral TID  . sodium chloride flush  3 mL Intravenous Q12H  . tamsulosin  0.4 mg Oral Daily  . Warfarin - Pharmacist Dosing Inpatient   Does not apply q1800    Infusions: . sodium chloride Stopped (12/04/17 1330)  . sodium chloride    . cefTRIAXone (ROCEPHIN)  IV Stopped (12/07/17 1022)  . heparin 1,500 Units/hr (12/07/17 2015)  . lactated ringers 0 mL/hr at 12/04/17 1226  . milrinone 0.1 mcg/kg/min (12/08/17 0458)    PRN Medications: sodium chloride, acetaminophen, acetaminophen, guaiFENesin, ondansetron (ZOFRAN) IV, ondansetron (ZOFRAN) IV, sodium chloride flush, sodium chloride flush   VAD EVENTS   GU Event 09/01/14 had impacted ureteral stone and underwent cystoscopy and flexible ureteroscopy with lithotripsy and basket extraction.   09/2015 Hematuria and chronic  nephrolithiasis. Renal US showed mild hyronephrosis and bilateral renal cysts. Dr. Risa Grill took back for repeat lithotripsy and stone extraction by ureteroscopy and J stent placement. 12/18 Recurrent hematuria. 71mm left ureteral stone. 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  4/2-18 - Episode of melena. Hgb 10.9-> 8.7. EGD/colonoscopy negative 12/18 - Melena with syncope  Hgb 6.5. Transfused 5u. 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  01/2016 R CEA. He was started on Plavix.  01/2016 Lumbar fusion L3-L5 with pedicle screws posterolateral arthrodesis with BMP, allograft  Patient Profile  [name] is a 77 y.o. 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 Required short term milrinone.   Admitted 1/25 for elective ureteroscopy due to R ureteral stone.  Still with occasional hematuria. Continues with fatigue. RHC 1/25 showed low output due to RV failure (see below). Milrinone initiated at 0.125.  Assessment/Plan:    1. 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 Struggling with progressive NYHA III symptoms.  RHC 1/25 showed primarily RHF with low output.  - No real symptomatic improvement with milrinone. Continue for now at 0.1 with AKI.  - Volume status improved with IV lasix yesterday. Ca redose as needed - Continue Sildenafil 20 mg TID with RV failure.  - Continue VAD speed at 8800. VAD interrogated personally. Parameters stable. 2. Recurrent renal stone with ongoing hematuria and UTI: Left ureteral stone in 12/18. - Last dose of coumadin 1-24/2019 - 12/03/2017. CT with L stone and possible mass - Cystoscopy + Left Ureteroscopy with stent placed 12/04/17. -  Started rocephin 12/03/17. Ucx negative.  - Still with gross hematuria. Hgb trending down slowly. Transfuse 1u RBCs today - D/w Dr. Gloriann Loan yesterday expects some hematuria with stent. Wil remove stent at bedside on Monday.  - INR 1.9. Will stop heparin. Continue warfarin. Discussed dosing with PharmD personally. 3. AKI - Creatinine much worse today.  - Renal u/s without obstruction on 2/1. Given ongoing hematuria with clots will repeat today to exclude obstruction.  - Possible AKI from OR (ATN)  - Hold diuretics. Encourage po intake. Consult Renal. Will d/w Urology as well. 4. Bladder Tumor - Seen on CT 1/29 and confirmed/resected on cystoscopy 12/04/17. - Pathology c/w high-grade urothelial CA. Discussed with Dr. Gloriann Loan. Plan outpatient BCG tretment 5. LVAD:  - VAD interrogated personally. Parameters stable.  - LDH stable - on low-dose heparin and coumadin. INR 1.9 stop heparin  6. H/o GIB: S/p clipping of 2 jejunal AVMs in 4/16. Continue coumadin and plavix (with h/o CVA). Admitted 4/18 with recurrent GIB. Transfused 2u. EGD/colon negative. Admitted 12/18, Hgb 6.5, Transfused 5u. EGD with 2 AVMs treated with APC - stable 7.  Atrial arrhythmias: Paroxysmal atrial fibrillation, atrial tachycardia.  S/p DC-CV 06/04/14, DC-CV 03/02/15 and 01/19/16.  - remains in NSR.  - Continue amiodarone.  8. HTN:  - MAPs stable.  9. 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 No further.  - Continue atorvastatin. (also warfarin INR goal 2-2.5). S/p R CEA 01/13/16.  - Plavix stopped 12/18 due to recurrent GIB 10. H/o amio-induced hypothyroidism: Amio restarted due to recurrent atrial arrhythmias and VT.  - Continue home levothyroxine dose. No change.  11. VT:  - Underwent DC-CV 9/17. Repeat episode of slow VT 08/17/17 requiring DC-CV.  - Continue amio 200 daily.  - Goal K > 4.0 Mg > 2.0. Stable.   Length of Stay: 9  Glori Bickers, MD 12/08/2017, 7:52 AM  VAD Team --- VAD ISSUES ONLY--- Pager [phone] (7am -  7am)  Advanced Heart Failure Team  Pager [phone] (M-F; 7a - 4p)   Please contact [facility] for night-coverage after hours (4p -7a ) and weekends on [email or web address]

## 2017-12-08 NOTE — Consult Note (Signed)
[facility] Consult Note     Date: 12/08/2017                  Patient Name:  [name]  MRN: [id number]  DOB: February 22, 1941  Age / Sex: 77 y.o., male         PCP: Jolaine Artist, MD                 Service Requesting Consult: HF Team                 Reason for Consult: AKI            Chief Complaint:   HPI: SIRUS LABRIE is a 77 y.o. male  with h/o VT, HTN, HLD, hx AAA repair, CKD III (baseline Cr 1.2-1.5), hx of acute GIBs 2/2 AVMs, PAD and severe systolic HF (EF 06-26%) due to mixed ischemic/nonischemic CM with LVAD managed by Dr. Haroldine Laws presenting for hematuria and elective uteroscopy with need for management of his anticoagulation prior to surgery.    HPI from patient and significant other [name]) who is also his caretaker. Live outside [place] and has had VAD for many years. Denies hx of kidney problems, has never seen nephrology. Takes PRN lasix at home (SO reports about 2-3 times per year). Has been urinating normally until recent episode of hematuria for which he was admitted. Denies change in SOB (baseline for patient). No cough or fever PTA. Unable to measure BPs at home 2/2 LVAD, MAP is typically 80.    Hospital course: [facility] on 11/29/17. Cystoscopy with TURBT and findings of large bladder tumor as well as left ureteral calculus. 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 Urology plans for intravesical BCG given difficulties with surgery and anticoagulation. Unable to visualize right ureteral orifice in OR. No hydronephrosis on [place] 2/1.  Cr trend: 2.21>2.46>2.47>3.92 BUN: 24>27>27>40 GFR 27>>13 Renal US 2/3 moderate L hydroureteronephrosis (?new), medical renal disease UOP 725 2/1> 1025 2/2 Patient reports urology team just removed his stent 1 day early. Undergoing bladder irrigation at present.   Past Medical History:  Diagnosis Date  . Arthritis   . Automatic implantable  cardioverter-defibrillator in situ    a. s/p Medtronic Evera device serial [id number]    . Bradycardia   . CAD (coronary artery disease)    a. s/p MI 1982;  s/p DES to CFX 2011;  s/p NSTEMI 4/12 in setting of VTach;  cath 7/12:  LM ok, LAD mid 30-40%, Dx's with 40%, mCFX stent patent, prox to mid RCA occluded with R to R and L to R collats.  Medical Tx was continued  . Chronic systolic heart failure ([place])    a. s/p LVAD 10/2013 for destination therapy  . CKD (chronic kidney disease)   . CVD (cerebrovascular disease)    a. s/p L CEA 2008  . Cytopenia   . Dysrhythmia    AFIB  . History of GI bleed    a. on ASA- started on plavix during 10/2015 admission for CVA  . HLD (hyperlipidemia)   . HTN (hypertension)   . Hypothyroidism   . Inappropriate therapy from implantable cardioverter-defibrillator    a. ATP for sinus tach SVT wavelet identified SVT but was no  passive (2014)  . Ischemic cardiomyopathy   . 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 L Ear   . Myocardial infarction ([place])    1992  . PAF (paroxysmal atrial fibrillation) (  [place])   . PVD (peripheral vascular disease) ([place])    a. 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 Shortness of breath dyspnea    W/ EXERTION   . Sleep apnea    NO CPAP NOW  HE SENT IT BACK YRS AGO  . Stroke [facility])    a. 10/2015 admission   . Ventricular tachycardia ([place])    a. s/p AICD;   h/o ICD shock;  Amiodarone Rx. S/P ablation in 2012    Past Surgical History:  Procedure Laterality Date  . BACK SURGERY    . CARDIAC CATHETERIZATION     "several" (05/25/2013)  . CARDIAC DEFIBRILLATOR PLACEMENT  2003; 2005; 05/25/2013   2014: Medtronic Evera device serial number [id number]  . CARDIAC ELECTROPHYSIOLOGY STUDY AND ABLATION  2012  . CARDIOVERSION N/A 10/15/2013   Procedure: CARDIOVERSION;  Surgeon: Jolaine Artist, MD;  Location: [place];  Service: Cardiovascular;   Laterality: N/A;  . CARDIOVERSION N/A 11/04/2013   Procedure: CARDIOVERSION;  Surgeon: Larey Dresser, MD;  Location: [place];  Service: Cardiovascular;  Laterality: N/A;  . CARDIOVERSION N/A 06/04/2014   Procedure: CARDIOVERSION;  Surgeon: Jolaine Artist, MD;  Location: [facility] ENDOSCOPY;  Service: Cardiovascular;  Laterality: N/A;  . CARDIOVERSION N/A 03/02/2015   Procedure: CARDIOVERSION;  Surgeon: Jolaine Artist, MD;  Location: [facility] ENDOSCOPY;  Service: Cardiovascular;  Laterality: N/A;  . CARDIOVERSION N/A 01/19/2016   Procedure: CARDIOVERSION;  Surgeon: Larey Dresser, MD;  Location: [place];  Service: Cardiovascular;  Laterality: N/A;  . CAROTID ENDARTERECTOMY Left ~ 2007  . CATARACT EXTRACTION W/ INTRAOCULAR LENS  IMPLANT, BILATERAL Bilateral 2000  . CHOLECYSTECTOMY  12/15/?2010  . COLONOSCOPY WITH PROPOFOL N/A 09-21-2017   Procedure: COLONOSCOPY WITH PROPOFOL;  Surgeon: Ronald Lobo, MD;  Location: [place];  Service: Endoscopy;  Laterality: N/A;  . CORONARY ANGIOPLASTY  1992  . CORONARY ANGIOPLASTY WITH STENT PLACEMENT     "last one was 11/2012  (05/25/2013)  . CYSTOSCOPY WITH RETROGRADE PYELOGRAM, URETEROSCOPY AND STENT PLACEMENT Left 08/09/2014   Procedure: CYSTOSCOPY WITH RETROGRADE PYELOGRAM, URETEROSCOPY AND STENT PLACEMENT;  Surgeon: Bernestine Amass, MD;  Location[facility];  Service: Urology;  Laterality: Left;  . CYSTOSCOPY WITH RETROGRADE PYELOGRAM, URETEROSCOPY AND STENT PLACEMENT Left 09/01/2014   Procedure: CYSTOSCOPY WITH URETEROSCOPY AND STENT REMOVAL;  Surgeon: Bernestine Amass, MD;  [facility];  Service: Urology;  Laterality: Left;  . CYSTOSCOPY WITH RETROGRADE PYELOGRAM, URETEROSCOPY AND STENT PLACEMENT Left 09/20/2014   Procedure: CYSTOSCOPY WITH RETROGRADE PYELOGRAM, URETEROSCOPY AND STENT PLACEMENT, stone removal;  Surgeon: Bernestine Amass, MD;  [facility];  Service: Urology;  Laterality: Left;  . CYSTOSCOPY WITH RETROGRADE PYELOGRAM,  URETEROSCOPY AND STENT PLACEMENT Bilateral 12/04/2017   Procedure: CYSTOSCOPY WITH BILATERAL RETROGRADE PYELOGRAM, LEFT URETEROSCOPY AND STENT PLACEMENT;  Surgeon: Lucas Mallow, MD;  Location: [place];  Service: Urology;  Laterality: Bilateral;  . DOPPLER ECHOCARDIOGRAPHY  2011  . ELBOW ARTHROSCOPY Right 1990's  . ELECTROPHYSIOLOGIC STUDY N/A 07/19/2016   Procedure: Cardioversion;  Surgeon: Evans Lance, MD;  Location: [place] CV LAB;  Service: Cardiovascular;  Laterality: N/A;  . ENDARTERECTOMY Right 01/13/2016   Procedure: RIGHT CAROTID ARTERY ENDARTERECTOMY;  Surgeon: Mal Misty, MD;  Location: [place];  Service: Vascular;  Laterality: Right;  . ENTEROSCOPY Left 02/23/2017   Procedure: ENTEROSCOPY;  Surgeon:  Ronald Lobo, MD;  Location: [facility] ENDOSCOPY;  Service: Endoscopy;  Laterality: Left;  . ENTEROSCOPY N/A 10/17/2017   Procedure: ENTEROSCOPY;  Surgeon: Yetta Flock, MD;  Location: [facility] ENDOSCOPY;  Service: Gastroenterology;  Laterality: N/A;  LVAD pt.    . ESOPHAGOGASTRODUODENOSCOPY N/A 02/15/2015   Procedure: ESOPHAGOGASTRODUODENOSCOPY (EGD);  Surgeon: Carol Ada, MD;  Location: [facility] ENDOSCOPY;  Service: Endoscopy;  Laterality: N/A;  LVAD  . EYE SURGERY     VITRECTOMY  LEFT 05/2012  . FEMORAL-POPLITEAL BYPASS GRAFT Right 2011  . FEMORAL-POPLITEAL BYPASS GRAFT Left 05/2002   Archie Endo 05/06/2002 (05/25/2013)  . HEEL SPUR SURGERY Right 1990's  . HOLMIUM LASER APPLICATION Left 40/97/3532   Procedure: HOLMIUM LASER APPLICATION;  Surgeon: Bernestine Amass, MD;  [facility];  Service: Urology;  Laterality: Left;  . HOLMIUM LASER APPLICATION Left 9/92/4268   Procedure: HOLMIUM LASER APPLICATION;  Surgeon: Lucas Mallow, MD;  Location: [place];  Service: Urology;  Laterality: Left;  . IMPLANTABLE CARDIOVERTER DEFIBRILLATOR GENERATOR CHANGE N/A 05/25/2013   Procedure: IMPLANTABLE CARDIOVERTER DEFIBRILLATOR GENERATOR CHANGE;  Surgeon: Deboraha Sprang, MD;  Location: [facility] CATH LAB;   Service: Cardiovascular;  Laterality: N/A;  . INSERTION OF IMPLANTABLE LEFT VENTRICULAR ASSIST DEVICE N/A 10/19/2013   Procedure: INSERTION OF IMPLANTABLE LEFT VENTRICULAR ASSIST DEVICE;  Surgeon: Gaye Pollack, MD;  Location: [place];  Service: Open Heart Surgery;  Laterality: N/A;  CIRC ARREST  NITRIC OXIDE  MEDTRONIC ICD  . INTRAOPERATIVE TRANSESOPHAGEAL ECHOCARDIOGRAM N/A 10/19/2013   Procedure: INTRAOPERATIVE TRANSESOPHAGEAL ECHOCARDIOGRAM;  Surgeon: Gaye Pollack, MD;  Location: [facility] OR;  Service: Open Heart Surgery;  Laterality: N/A;  . KNEE ARTHROSCOPY Bilateral 1990's  . LEAD REVISION N/A 06/05/2013   Procedure: LEAD REVISION;  Surgeon: Thompson Grayer, MD;  Location: [facility] CATH LAB;  Service: Cardiovascular;  Laterality: N/A;  . LEFT HEART CATHETERIZATION WITH CORONARY ANGIOGRAM N/A 11/24/2012   Procedure: LEFT HEART CATHETERIZATION WITH CORONARY ANGIOGRAM;  Surgeon: Peter M Martinique, MD;  Location: [facility] CATH LAB;  Service: Cardiovascular;  Laterality: N/A;  . LIPOMA EXCISION Left 2013   "near ear" (05/25/2013)  . [place]; 2000's  . PATCH ANGIOPLASTY Right 01/13/2016   Procedure: WITH  PATCH ANGIOPLASTY;  Surgeon: Mal Misty, MD;  Location: [place];  Service: Vascular;  Laterality: Right;  . RENAL ARTERY BYPASS Bilateral 2003  . REVASCULARIZATION / IN-SITU GRAFT LEG    . RIGHT HEART CATH N/A 11/29/2017   Procedure: RIGHT HEART CATH;  Surgeon: Jolaine Artist, MD;  Location: [place] CV LAB;  Service: Cardiovascular;  Laterality: N/A;  . RIGHT HEART CATHETERIZATION N/A 09/17/2013   Procedure: RIGHT HEART CATH;  Surgeon: Jolaine Artist, MD;  Location: [facility] CATH LAB;  Service: Cardiovascular;  Laterality: N/A;  . RIGHT HEART CATHETERIZATION N/A 10/14/2013   Procedure: RIGHT HEART CATH;  Surgeon: Jolaine Artist, MD;  Location: [facility] CATH LAB;  Service: Cardiovascular;  Laterality: N/A;  . RIGHT HEART CATHETERIZATION N/A 11/16/2013   Procedure: RIGHT HEART CATH;  Surgeon:  Jolaine Artist, MD;  Location: [facility] CATH LAB;  Service: Cardiovascular;  Laterality: N/A;  . RIGHT HEART CATHETERIZATION N/A 07/13/2014   Procedure: RIGHT HEART CATH;  Surgeon: Jolaine Artist, MD;  Location: [facility] CATH LAB;  Service: Cardiovascular;  Laterality: N/A;  . TEE WITHOUT CARDIOVERSION N/A 11/04/2013   Procedure: TRANSESOPHAGEAL ECHOCARDIOGRAM (TEE);  Surgeon: Larey Dresser, MD;  Location: [place];  Service: Cardiovascular;  Laterality: N/A;  . TEE WITHOUT CARDIOVERSION  N/A 03/02/2015   Procedure: TRANSESOPHAGEAL ECHOCARDIOGRAM (TEE);  Surgeon: Jolaine Artist, MD;  Location: [facility] ENDOSCOPY;  Service: Cardiovascular;  Laterality: N/A;  . TEE WITHOUT CARDIOVERSION N/A 01/19/2016   Procedure: TRANSESOPHAGEAL ECHOCARDIOGRAM (TEE);  Surgeon: Larey Dresser, MD;  Location: [place];  Service: Cardiovascular;  Laterality: N/A;  . TONSILLECTOMY  1946  . TRANSURETHRAL RESECTION OF BLADDER TUMOR N/A 12/04/2017   Procedure: TRANSURETHRAL RESECTION OF BLADDER TUMOR (TURBT);  Surgeon: Lucas Mallow, MD;  Location: [place];  Service: Urology;  Laterality: N/A;  . TUMOR EXCISION Left 1960's   "fatty tumor" (05/25/2013)  . V-TACH ABLATION N/A 09/12/2011   Procedure: V-TACH ABLATION;  Surgeon: Evans Lance, MD;  Location: [facility] CATH LAB;  Service: Cardiovascular;  Laterality: N/A;  . V-TACH ABLATION N/A 06/08/2013   Procedure: V-TACH ABLATION;  Surgeon: Evans Lance, MD;  Location: [facility] CATH LAB;  Service: Cardiovascular;  Laterality: N/A;    Family History  Problem Relation Age of Onset  . Heart attack Mother   . Coronary artery disease Mother   . Cancer Mother   . Heart disease Mother   . Hyperlipidemia Mother   . Hypertension Mother   . Coronary artery disease Father   . Stroke Father   . Cancer Father   . Heart disease Father        before age 61  . Hyperlipidemia Father   . Hypertension Father   . Heart attack Father   . Coronary artery disease Unknown    Social History:   reports that he quit smoking about 16 years ago. His smoking use included cigarettes. He has a 18.50 pack-year smoking history. he has never used smokeless tobacco. He reports that he drinks alcohol. He reports that he does not use drugs. Lives with significant other [name].   Allergies:  Allergies  Allergen Reactions  . Demerol [Meperidine] Other (See Comments)    Paralysis. Could only move eyes.     Medications Prior to Admission  Medication Sig Dispense Refill  . amiodarone (PACERONE) 200 MG tablet Take 200 mg by mouth daily.    [name] atorvastatin (LIPITOR) 80 MG tablet TAKE 1 TABLET DAILY AT 6 PM (Patient taking differently: TAKE 1 TABLET (80 mg) DAILY in the morning) 30 tablet 11  . docusate sodium (COLACE) 100 MG capsule Take 200 mg by mouth every morning.     [name] levothyroxine (SYNTHROID, LEVOTHROID) 150 MCG tablet Take 1 tablet (150 mcg total) by mouth daily before breakfast. 90 tablet 3  . metoprolol succinate (TOPROL-XL) 25 MG 24 hr tablet Take 0.5 tablets (12.5 mg total) by mouth daily. 15 tablet 6  . Multiple Vitamins-Minerals (ICAPS PO) Take 1 tablet by mouth 2 (two) times daily.    . pantoprazole (PROTONIX) 40 MG tablet Take 1 tablet (40 mg total) by mouth 2 (two) times daily. 180 tablet 6  . ranolazine (RANEXA) 500 MG 12 hr tablet Take 1 tablet (500 mg total) by mouth 2 (two) times daily. 60 tablet 6  . tamsulosin (FLOMAX) 0.4 MG CAPS capsule Take 1 capsule (0.4 mg total) by mouth daily. 30 capsule 3  . warfarin (COUMADIN) 2 MG tablet Take 2-4 mg by mouth daily. Take 2 mg (1 tablet) daily except 4 mg (2 tablets) on Tues    . zinc gluconate 50 MG tablet Take 50 mg by mouth 2 (two) times daily.     . furosemide (LASIX) 20 MG tablet Take 20 mg by mouth daily as needed for  fluid.    . nitroGLYCERIN (NITROSTAT) 0.4 MG SL tablet Place 0.4 mg under the tongue every 5 (five) minutes as needed for chest pain.      Results for orders placed or performed during the hospital encounter of  11/29/17 (from the past 48 hour(s))  Heparin level (unfractionated)     Status: Abnormal   Collection Time: 12/06/17 10:21 PM  Result Value Ref Range   Heparin Unfractionated <0.10 (L) 0.30 - 0.70 IU/mL    Comment:        IF HEPARIN RESULTS ARE BELOW EXPECTED VALUES, AND PATIENT DOSAGE HAS BEEN CONFIRMED, SUGGEST FOLLOW UP TESTING OF ANTITHROMBIN III LEVELS. Performed at [facility], [place] [address], [place], [place] [address]   Lactate dehydrogenase     Status: Abnormal   Collection Time: 12/07/17  2:12 AM  Result Value Ref Range   LDH 247 (H) 98 - 192 U/L    Comment: Performed at [facility] [address] [place], [place] [address]  Protime-INR     Status: Abnormal   Collection Time: 12/07/17  2:12 AM  Result Value Ref Range   Prothrombin Time 18.2 (H) 11.4 - 15.2 seconds   INR 1.52     Comment: Performed at [facility] [address], [place], [place] [address]  Basic metabolic panel     Status: Abnormal   Collection Time: 12/07/17  2:12 AM  Result Value Ref Range   Sodium 132 (L) 135 - 145 mmol/L   Potassium 4.6 3.5 - 5.1 mmol/L   Chloride 102 101 - 111 mmol/L   CO2 21 (L) 22 - 32 mmol/L   Glucose, Bld 131 (H) 65 - 99 mg/dL   BUN 27 (H) 6 - 20 mg/dL   Creatinine, Ser 2.47 (H) 0.61 - 1.24 mg/dL   Calcium 8.4 (L) 8.9 - 10.3 mg/dL   GFR calc non Af [name] 23 (L) >60 mL/min   GFR calc Af [name] 27 (L) >60 mL/min    Comment: (NOTE) The eGFR has been calculated using the CKD EPI equation. This calculation has not been validated in all clinical situations. eGFR's persistently <60 mL/min signify possible Chronic Kidney Disease.    Anion gap 9 5 - 15    Comment: Performed at [facility] [address] [place], [place] [address]  CBC with Differential/Platelet     Status: Abnormal   Collection Time: 12/07/17  2:12 AM  Result Value Ref Range   WBC 9.1 4.0 - 10.5 K/uL   RBC 3.18 (L) 4.22 - 5.81 MIL/uL   Hemoglobin 9.0 (L) 13.0 - 17.0 g/dL   HCT  28.5 (L) 39.0 - 52.0 %   MCV 89.6 78.0 - 100.0 fL   MCH 28.3 26.0 - 34.0 pg   MCHC 31.6 30.0 - 36.0 g/dL   RDW 18.1 (H) 11.5 - 15.5 %   Platelets 158 150 - 400 K/uL   Neutrophils Relative % 73 %   Neutro Abs 6.7 1.7 - 7.7 K/uL   Lymphocytes Relative 9 %   Lymphs Abs 0.8 0.7 - 4.0 K/uL   Monocytes Relative 17 %   Monocytes Absolute 1.5 (H) 0.1 - 1.0 K/uL   Eosinophils Relative 1 %   Eosinophils Absolute 0.1 0.0 - 0.7 K/uL   Basophils Relative 0 %   Basophils Absolute 0.0 0.0 - 0.1 K/uL    Comment: Performed at [facility], [address], [place],  [address]  Heparin  level (unfractionated)     Status: Abnormal   Collection Time: 12/07/17  7:32 AM  Result Value Ref Range   Heparin Unfractionated <0.10 (L) 0.30 - 0.70 IU/mL    Comment:        IF HEPARIN RESULTS ARE BELOW EXPECTED VALUES, AND PATIENT DOSAGE HAS BEEN CONFIRMED, SUGGEST FOLLOW UP TESTING OF ANTITHROMBIN III LEVELS. Performed at [facility], [place] [address], [place], [place] [address]   Heparin level (unfractionated)     Status: Abnormal   Collection Time: 12/07/17  5:44 PM  Result Value Ref Range   Heparin Unfractionated <0.10 (L) 0.30 - 0.70 IU/mL    Comment:        IF HEPARIN RESULTS ARE BELOW EXPECTED VALUES, AND PATIENT DOSAGE HAS BEEN CONFIRMED, SUGGEST FOLLOW UP TESTING OF ANTITHROMBIN III LEVELS. Performed at [facility], [place] [address] [place], [place] [address]   Lactate dehydrogenase     Status: Abnormal   Collection Time: 12/08/17  5:05 AM  Result Value Ref Range   LDH 250 (H) 98 - 192 U/L    Comment: Performed at [facility], [place] [address] [place], [place] [address]  Protime-INR     Status: Abnormal   Collection Time: 12/08/17  5:05 AM  Result Value Ref Range   Prothrombin Time 21.6 (H) 11.4 - 15.2 seconds   INR 1.90     Comment: Performed at [facility] [address], [place], [place] [address]  Basic metabolic panel     Status: Abnormal    Collection Time: 12/08/17  5:05 AM  Result Value Ref Range   Sodium 132 (L) 135 - 145 mmol/L   Potassium 4.5 3.5 - 5.1 mmol/L   Chloride 100 (L) 101 - 111 mmol/L   CO2 21 (L) 22 - 32 mmol/L   Glucose, Bld 123 (H) 65 - 99 mg/dL   BUN 40 (H) 6 - 20 mg/dL   Creatinine, Ser 3.92 (H) 0.61 - 1.24 mg/dL    Comment: DELTA CHECK NOTED   Calcium 8.2 (L) 8.9 - 10.3 mg/dL   GFR calc non Af [name] 13 (L) >60 mL/min   GFR calc Af [name] 16 (L) >60 mL/min    Comment: (NOTE) The eGFR has been calculated using the CKD EPI equation. This calculation has not been validated in all clinical situations. eGFR's persistently <60 mL/min signify possible Chronic Kidney Disease.    Anion gap 11 5 - 15    Comment: Performed at [facility] [address], [place], [place] [address]  CBC with Differential/Platelet     Status: Abnormal   Collection Time: 12/08/17  5:05 AM  Result Value Ref Range   WBC 8.1 4.0 - 10.5 K/uL   RBC 2.99 (L) 4.22 - 5.81 MIL/uL   Hemoglobin 8.5 (L) 13.0 - 17.0 g/dL   HCT 26.6 (L) 39.0 - 52.0 %   MCV 89.0 78.0 - 100.0 fL   MCH 28.4 26.0 - 34.0 pg   MCHC 32.0 30.0 - 36.0 g/dL   RDW 18.1 (H) 11.5 - 15.5 %   Platelets 146 (L) 150 - 400 K/uL   Neutrophils Relative % 72 %   Neutro Abs 5.9 1.7 - 7.7 K/uL   Lymphocytes Relative 6 %   Lymphs Abs 0.5 (L) 0.7 - 4.0 K/uL   Monocytes Relative 20 %   Monocytes Absolute 1.6 (H) 0.1 - 1.0 K/uL   Eosinophils Relative 2 %   Eosinophils Absolute  0.1 0.0 - 0.7 K/uL   Basophils Relative 0 %   Basophils Absolute 0.0 0.0 - 0.1 K/uL    Comment: Performed at [facility], [place] [address], [place], [place] [address]  Heparin level (unfractionated)     Status: Abnormal   Collection Time: 12/08/17  5:05 AM  Result Value Ref Range   Heparin Unfractionated 0.28 (L) 0.30 - 0.70 IU/mL    Comment:        IF HEPARIN RESULTS ARE BELOW EXPECTED VALUES, AND PATIENT DOSAGE HAS BEEN CONFIRMED, SUGGEST FOLLOW UP TESTING OF ANTITHROMBIN III  LEVELS. Performed at [facility], [place] [address] [place], [place] [address]   Prepare RBC     Status: None   Collection Time: 12/08/17  9:17 AM  Result Value Ref Range   Order Confirmation      ORDER PROCESSED BY BLOOD BANK Performed at [facility], [place] [address] [place], [place] [address]   Type and screen [place]     Status: None (Preliminary result)   Collection Time: 12/08/17  9:30 AM  Result Value Ref Range   ABO/RH(D) O POS    Antibody Screen NEG    Sample Expiration 12/11/2017    Unit Number [id number]    Blood Component Type RED CELLS,LR    Unit division 00    Status of Unit ISSUED    Transfusion Status OK TO TRANSFUSE    Crossmatch Result      Compatible Performed at [facility], [place] [address], [place], [place] [address]    *Note: Due to a large number of results and/or encounters for the requested time period, some results have not been displayed. A complete set of results can be found in Results Review.   US Renal  Result Date: 12/08/2017 CLINICAL DATA:  Acute kidney injury, recent ureteral stent, ongoing hematuria, reassess for hydronephrosis EXAM: RENAL / URINARY TRACT ULTRASOUND COMPLETE COMPARISON:  12/06/2017 FINDINGS: Right Kidney: Length: 10.9 cm. Cortical thinning. Echogenic renal parenchyma. 7.2 x 6.0 x 6.2 cm lower pole cyst, simple. Additional 1.8 x 1.8 x 2.1 cm lower pole cyst. No hydronephrosis. Left Kidney: Length: 12.1 cm. Cortical thinning. Echogenic renal parenchyma. 3.4 x 3.2 x 3.5 cm interpolar cyst, simple. Moderate hydroureteronephrosis. Bladder: Decompressed by indwelling Foley catheter. IMPRESSION: Moderate left hydroureteronephrosis, new/recurrent. Bilateral renal cysts, unchanged from recent studies, as above. Cortical thinning with a echogenic renal parenchyma, suggesting medical renal disease. Electronically Signed   By: Julian Hy M.D.   On: 12/08/2017 11:22   US Renal  Result Date:  12/06/2017 CLINICAL DATA:  Renal insufficiency for 1 month. Kidney stone removed and bladder tumor resection on 12/04/2017. History of hypertension, chronic kidney disease, coronary artery disease. EXAM: RENAL / URINARY TRACT ULTRASOUND COMPLETE COMPARISON:  CT abdomen and pelvis 12/03/2017 FINDINGS: Right Kidney: Length: 11.1 cm. Diffuse parenchymal atrophy. Increased parenchymal echotexture consistent with chronic medical renal disease. Simple appearing cyst off the lower pole right kidney measuring 8.2 cm diameter. A smaller parenchymal cysts centrally measures 2.1 cm diameter. These were present on previous CT study and are consistent with benign cysts. No hydronephrosis. Left Kidney: Length: 12.5 cm. Diffuse parenchymal atrophy. Increased parenchymal echotexture consistent with chronic medical renal disease. Simple appearing cyst off of the lower pole measures 4.1 cm maximal diameter. There is calcification in the wall of the cyst. This was present on the previous CT without change and likely represents a benign cyst. No hydronephrosis. Bladder: Bladder  is decompressed with a Foley catheter in place. IMPRESSION: 1. Changes of chronic medical renal disease with bilateral parenchymal atrophy and increased parenchymal echotexture. 2. Bilateral benign-appearing renal cysts. 3. No hydronephrosis. 4. Bladder decompressed with Foley catheter. Electronically Signed   By: Lucienne Capers M.D.   On: 12/06/2017 21:33    Review of Systems  Constitutional: Negative for chills, diaphoresis and fever.  HENT: Negative for congestion.   Eyes: Negative for double vision.  Respiratory: Positive for shortness of breath. Negative for cough.   Cardiovascular: Negative for leg swelling.  Gastrointestinal: Negative for abdominal pain.  Genitourinary: Positive for hematuria. Negative for dysuria and flank pain.  Skin: Negative for rash.  Neurological: Negative for sensory change, speech change and focal weakness.     Blood pressure 91/76, pulse 73, temperature 98.8 F (37.1 C), temperature source Axillary, resp. rate 14, height 6' (1.829 m), weight 195 lb 3.2 oz (88.5 kg), SpO2 (!) 83 %. Physical Exam  Constitutional: He is oriented to person, place, and time. He appears well-developed and well-nourished. No distress.  Thin elderly male lying in bed. Pleasant.  HENT:  Head: Normocephalic and atraumatic.  Right Ear: External ear normal.  Left Ear: External ear normal.  Eyes: EOM are normal. Pupils are equal, round, and reactive to light. No scleral icterus.  Neck: Normal range of motion. Neck supple.  Cardiovascular:  Single mechanical heart sound.   Respiratory: Effort normal and breath sounds normal. No respiratory distress.  GI: Soft. Bowel sounds are normal. He exhibits no distension. There is no tenderness.  Musculoskeletal: Normal range of motion. He exhibits no edema.  Neurological: He is alert and oriented to person, place, and time.  Skin: Skin is warm and dry.  Psychiatric: He has a normal mood and affect. His behavior is normal.    Assessment/Plan KAMON FAHR is a 77 y.o. 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 Haroldine Laws presenting for hematuria and elective uteroscopy with need for management of his anticoagulation prior to surgery. Nephrology service consulted for AKI.   AKI on CKD III: Possibly contributed to by acute L moderate hydronephrosis seen on [place] today. Additional possible etiologies include  ATN, cardiorenal syndrome, renal vascular disease. No acute indication for CRRT.  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  Has CKD.  He had Cysto-RPG with stone fragmentation, TURBT and L ureteral stent placement.  Post op day 1 there was the beginning of a rise in sCreat which has continued.  The L ureteral stent was associated with mod hydro so it was removed.  I suspect there is a hemodynamic and obstructive component of the AKI superimposed upon underlying CKD.  Treatment is supportive and resolving any residual anatomical obstructive uropathy. Estanislado Emms, MD

## 2017-12-08 NOTE — Progress Notes (Signed)
Dr. Jeffie Pollock notified of renal US results, orders to hold initiation of continuous bladder irrigation until he rounds of patient again. Upon arrival to room Dr. Jeffie Pollock removed renal stent at bedside and requested continuous bladder irrigation be started. Initially irrigation was started with no complication and resulted in good volume removal. Approximately an hour later patient called out stating he was having some lower abdominal discomfort. Bladder scan revealed an empty/undetectable bladder. Dr. Jeffie Pollock called and orders giving to discontinue continuous bladder irrigation. Will continue to monitor.

## 2017-12-09 LAB — BPAM RBC
BLOOD PRODUCT EXPIRATION DATE: 201902172359
ISSUE DATE / TIME: 201902031130
UNIT TYPE AND RH: 5100

## 2017-12-09 LAB — BASIC METABOLIC PANEL
ANION GAP: 12 (ref 5–15)
BUN: 49 mg/dL — ABNORMAL HIGH (ref 6–20)
CALCIUM: 8.3 mg/dL — AB (ref 8.9–10.3)
CO2: 19 mmol/L — AB (ref 22–32)
Chloride: 103 mmol/L (ref 101–111)
Creatinine, Ser: 4.89 mg/dL — ABNORMAL HIGH (ref 0.61–1.24)
GFR calc non Af Amer: 10 mL/min — ABNORMAL LOW (ref >60)
GFR, EST AFRICAN AMERICAN: 12 mL/min — AB (ref >60)
Glucose, Bld: 126 mg/dL — ABNORMAL HIGH (ref 65–99)
POTASSIUM: 4.3 mmol/L (ref 3.5–5.1)
Sodium: 134 mmol/L — ABNORMAL LOW (ref 135–145)

## 2017-12-09 LAB — CBC WITH DIFFERENTIAL/PLATELET
BASOS ABS: 0 10*3/uL (ref 0.0–0.1)
BASOS PCT: 0 %
Eosinophils Absolute: 0.2 10*3/uL (ref 0.0–0.7)
Eosinophils Relative: 3 %
HEMATOCRIT: 28.7 % — AB (ref 39.0–52.0)
HEMOGLOBIN: 9.2 g/dL — AB (ref 13.0–17.0)
Lymphocytes Relative: 8 %
Lymphs Abs: 0.6 10*3/uL — ABNORMAL LOW (ref 0.7–4.0)
MCH: 28.3 pg (ref 26.0–34.0)
MCHC: 32.1 g/dL (ref 30.0–36.0)
MCV: 88.3 fL (ref 78.0–100.0)
Monocytes Absolute: 1.4 10*3/uL — ABNORMAL HIGH (ref 0.1–1.0)
Monocytes Relative: 18 %
NEUTROS ABS: 5.4 10*3/uL (ref 1.7–7.7)
NEUTROS PCT: 71 %
Platelets: 163 10*3/uL (ref 150–400)
RBC: 3.25 MIL/uL — ABNORMAL LOW (ref 4.22–5.81)
RDW: 17.7 % — ABNORMAL HIGH (ref 11.5–15.5)
WBC: 7.6 10*3/uL (ref 4.0–10.5)

## 2017-12-09 LAB — TYPE AND SCREEN
ABO/RH(D): O POS
ANTIBODY SCREEN: NEGATIVE
UNIT DIVISION: 0

## 2017-12-09 LAB — PROTIME-INR
INR: 2.11
PROTHROMBIN TIME: 23.4 s — AB (ref 11.4–15.2)

## 2017-12-09 LAB — LACTATE DEHYDROGENASE: LDH: 259 U/L — ABNORMAL HIGH (ref 98–192)

## 2017-12-09 MED ORDER — WARFARIN SODIUM 2 MG PO TABS: 2.0000 mg | ORAL_TABLET | Freq: Once | ORAL | Status: DC

## 2017-12-09 MED ORDER — ORAL CARE MOUTH RINSE
15.0000 mL | Freq: Two times a day (BID) | OROMUCOSAL | Status: DC
  Administered 2017-12-12 – 2017-12-17 (×6): 15 mL via OROMUCOSAL

## 2017-12-09 MED ORDER — PROMETHAZINE HCL 25 MG/ML IJ SOLN
12.5000 mg | Freq: Four times a day (QID) | INTRAMUSCULAR | Status: DC | PRN
  Administered 2017-12-09: 12.5 mg via INTRAVENOUS
  Filled 2017-12-09: qty 1

## 2017-12-09 NOTE — Progress Notes (Signed)
LVAD Coordinator Rounding Note:  Admitted 11/29/17 due for elective ureteroscopy due to right ureteral stone. RHC showed low output due to RV failure. Milrinone initiated.   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 Cyndia Bent with oversewn aortic valve under DT criteria.  Pt is post op day 5  from left ureteroscopy with laser lithotripsy, retrograde peylogram, left ureteral stent, and  resection of bladder tumor. Pt denies any pain. Foley cath with dark brown urine; pt reports that his catheter had to be flushed multiple over the weekend for clots.   Vital signs: Temp: 98.3 HR: 73 Doppler Pressure:  83 Automatic BP:  91/75 (83) O2 Sat:  96% on RA   Weight: 194>191>196>197>196>197>199>200>197 lbs  GTTS: Milrinone 0.1 mcg/kg/min Heparin - 12/06/17 off 12/08/17  LVAD interrogation reveals:  Speed: 8800 Flow:  4.6 Power:  4.8w PI: 6.9 Alarms: none Events:  1 PI event  Fixed speed: 8800 Low speed limit: 8200  Drive Line: Sorbaview dressing dry and intact; anchor intact and accurately applied. Weekly dressing changes; Last changed 12/08/17 by wife. Next change is due 12/15/17.  Labs:  LDH trend: 362>332>270>275>280>269>285>319>259  INR trend: 2.20>2.12>1.89>1.90>1.75>1.58>1.48>1.33>1.27>2.11  Creat trend: 1.46>1.33>2.21>2.46  Anticoagulation Plan: - INR Goal:  2.0 - 2.5 - ASA Dose: no ASA (removed 02/2017 due to GI bleed)  Device: -Dual Med[employer] ICD -Therapies: on at 200 bpm - Last device check 10/02/17  Significant Events on VAD Support:  09/01/14>>ureteral stone with cystoscopy and flexible ureteroscopy with lithotripsy and basket extraction 02/2015 >>GIB with 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 Started on Plavix 3/17>>Lumbar fusion L3-L5 9/17>>VT with DC-CV; amio increased 10/17>>ramp with RV failure; speed decreased to  8800 02/2017> GIB (removed ASA, scopes negative) 03/2017>percutaneous lead repair after pump stops  10/18>>Slow VT. Cardioverted in ED and amiodarone uptitrated 12/18>>melena with scope. Hgb 6.5. EGD with 2 AVMs with APC 12/18>>recurrent hematuria. 6 mm left ureteral stone 1/19>>7 mm left ureteral stone removal with laser lithotripsy; left ureteral stent. Resection of 6 cm bladder tumor   Plan/Recommendations:   1. Pt encouraged to get up into the chair today and to try to eat/drink. 2. Please call VAD pager if any issues with VAD equipment or questions, concerns.  Tanda Rockers RN, BSN VAD Coordinator 24/7 Pager [phone]

## 2017-12-09 NOTE — Progress Notes (Signed)
[name]    Assessment/ Plan:   Frank Warehime McBrideis a 77 y.o.malewith h/o VT, HTN, HLD, hx AAA repair, CKD III (baseline Cr 1.2-1.5), hx of acute GIBs 2/2 AVMs, PAD and severe systolic HF (EF 83-38%) due to mixed ischemic/nonischemic CM with LVAD managed by Dr. Haroldine Laws presenting for hematuria and elective uteroscopy with need for management of his anticoagulation prior to surgery now with AKI on CKD 2/2 likely hemodynamic insult and obstruction.   AKI on CKD III: Likely 2/2 combined hemodynamic insult during surgery and acute L moderate hydronephrosis seen on [place] 2/3 (stent subsequently removed with clot present). No acute indication for CRRT. Cr worsened to 4.89. Euvolemic on exam. Seen with Dr. Gloriann Loan, will repeat US today as persistent obstruction would change management. UOP improved to 3330mL.  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  -per primary team  Recurrent renal stone with hematuria and UTI -per urology  Bladder tumor -per urology  LVAD per HF team    Subjective:   Feels well today, in good spirits. He is curious about what renal US would show. No pain.    Objective:   BP 91/75 (BP Location: Left Arm)   Pulse 73   Temp 98.3 F (36.8 C) (Oral)   Resp 14   Ht 6' (1.829 m)   Wt 197 lb 15.6 oz (89.8 kg)   SpO2 96%   BMI 26.85 kg/m   Intake/Output Summary (Last 24 hours) at 12/09/2017 1108 Last data filed at 12/09/2017 0403 Gross per 24 hour  Intake 1848.8 ml  Output 3300 ml  Net -1451.2 ml   Weight change: 2 lb 12.4 oz (1.258 kg)  Physical Exam: SNK:NLZJ elderly male lying in bed in NAD CVS: LVAD with single mechanical heart sound.  Resp: CTAB easy WOB Abd: soft, Nontender Ext: no edema  Imaging: US Renal  Result Date: 12/08/2017 CLINICAL DATA:  Acute kidney injury, recent ureteral stent, ongoing hematuria, reassess for  hydronephrosis EXAM: RENAL / URINARY TRACT ULTRASOUND COMPLETE COMPARISON:  12/06/2017 FINDINGS: Right Kidney: Length: 10.9 cm. Cortical thinning. Echogenic renal parenchyma. 7.2 x 6.0 x 6.2 cm lower pole cyst, simple. Additional 1.8 x 1.8 x 2.1 cm lower pole cyst. No hydronephrosis. Left Kidney: Length: 12.1 cm. Cortical thinning. Echogenic renal parenchyma. 3.4 x 3.2 x 3.5 cm interpolar cyst, simple. Moderate hydroureteronephrosis. Bladder: Decompressed by indwelling Foley catheter. IMPRESSION: Moderate left hydroureteronephrosis, new/recurrent. Bilateral renal cysts, unchanged from recent studies, as above. Cortical thinning with a echogenic renal parenchyma, suggesting medical renal disease. Electronically Signed   By: [name] M.D.   On: 12/08/2017 11:22    Labs: BMET Recent Labs  Lab 12/03/17 0255 12/04/17 0238 12/05/17 1016 12/06/17 0327 12/07/17 0212 12/08/17 0505 12/09/17 0418  NA 135 134* 135 134* 132* 132* 134*  K 4.1 4.1 4.6 4.5 4.6 4.5 4.3  CL 106 105 107 103 102 100* 103  CO2 20* 21* 19* 19* 21* 21* 19*  GLUCOSE 110* 110* 145* 124* 131* 123* 126*  BUN 23* 15 24* 27* 27* 40* 49*  CREATININE 1.46* 1.33* 2.21* 2.46* 2.47* 3.92* 4.89*  CALCIUM 8.4* 8.3* 8.7* 8.8* 8.4* 8.2* 8.3*   CBC Recent Labs  Lab 12/06/17 0326 12/07/17 0212 12/08/17 0505 12/09/17 0418  WBC 13.5* 9.1 8.1 7.6  NEUTROABS 10.3* 6.7 5.9 5.4  HGB 9.8* 9.0* 8.5* 9.2*  HCT 31.9* 28.5* 26.6* 28.7*  MCV 90.4 89.6  89.0 88.3  PLT 200 158 146* 163    Medications:    . amiodarone  200 mg Oral Daily  . atorvastatin  80 mg Oral q1800  . docusate sodium  200 mg Oral Daily  . levothyroxine  150 mcg Oral QAC breakfast  . pantoprazole  40 mg Oral Daily  . ranolazine  500 mg Oral BID  . sildenafil  20 mg Oral TID  . sodium chloride flush  3 mL Intravenous Q12H  . tamsulosin  0.4 mg Oral Daily  . Warfarin - Pharmacist Dosing Inpatient   Does not apply q1800    Ralene Ok, MD 12/09/17

## 2017-12-09 NOTE — Progress Notes (Addendum)
ANTICOAGULATION CONSULT NOTE - Follow Up Consult  Pharmacy Consult for Heparin and Warfarin Indication: LVAD  Allergies  Allergen Reactions  . Demerol [Meperidine] Other (See Comments)    Paralysis. Could only move eyes.     Patient Measurements: Height: 6' (182.9 cm) Weight: 197 lb 15.6 oz (89.8 kg) IBW/kg (Calculated) : 77.6  Vital Signs: Temp: 98.2 F (36.8 C) (02/04 1213) Temp Source: Oral (02/04 1213) BP: 101/77 (02/04 1213) Pulse Rate: 68 (02/04 1213)  Labs: Recent Labs    12/07/17 0212 12/07/17 0732 12/07/17 1744 12/08/17 0505 12/09/17 0418  HGB 9.0*  --   --  8.5* 9.2*  HCT 28.5*  --   --  26.6* 28.7*  PLT 158  --   --  146* 163  LABPROT 18.2*  --   --  21.6* 23.4*  INR 1.52  --   --  1.90 2.11  HEPARINUNFRC  --  <0.10* <0.10* 0.28*  --   CREATININE 2.47*  --   --  3.92* 4.89*    Estimated Creatinine Clearance: 13.7 mL/min (A) (by C-G formula based on SCr of 4.89 mg/dL (H)).  Assessment: 67 yoM with LVAD on warfarin at home admitted for RHC and ureteroscopy. INR 2.2 on admit, warfarin held, and he was started on heparin once INR < 2.0. S/P cystoscopy + ureteroscopy with stent placement 1/30. Warfarin resumed 1/31.  INR is 2.11 today. Heparin infusion had been discontinued yesterday. Hgb is 9.2 following 1 PRBC yesterday. Platelets trending upward (163). LDH is stable. Still with ongoing hematuria. S/p stent removal on 2/3.  Home Warfarin Dose: 4mg  Tues/2mg  all other days  Goal of Therapy:  INR 2-2.5 Monitor platelets by anticoagulation protocol: Yes   Plan:  -Warfarin 2 mg PO x1 tonight -Daily INR, CBC -Monitor S/Sx bleeding closely  Doylene Canard, PharmD Clinical Pharmacist  Pager: [phone] Clinical Phone for 12/09/2017 until 3:30pm: [phone] If after 3:30pm, please call main pharmacy at [phone]  12/09/2017 1:25 PM   ADDENDUM Renal function continues to worsen (Scr 4.89, uop 1.5 mL/kg/hr). Concern that might need to consider future  interventions pending renal function trend. Per HF team, will hold on warfarin dosing tonight. Will continue to monitor INR and follow along for plan with anticoagulation. Discussed with nursing.   Doylene Canard, PharmD Clinical Pharmacist  Pager: [phone] Phone: [phone]

## 2017-12-09 NOTE — Progress Notes (Signed)
Pt continuing to have persistent nausea and vomiting despite PRN Zofran, Darrick Grinder, NP called and new orders received. Will implement and continue to monitor.

## 2017-12-09 NOTE — Progress Notes (Signed)
Urology Inpatient Progress Report  Acute on chronic systolic (congestive) heart failure (HCC) [I50.23]  Procedure(s): CYSTOSCOPY WITH BILATERAL RETROGRADE PYELOGRAM, LEFT URETEROSCOPY AND STENT PLACEMENT HOLMIUM LASER APPLICATION TRANSURETHRAL RESECTION OF BLADDER TUMOR (TURBT)  5 Days Post-Op   Intv/Subj: No acute events overnight. Patient is without complaint except feeling tired, more than usual. No pain. Hgb appropriately responded after 1U PRBC yesterday (9.2 from 8.5). Bladder flushed to clear after minimal irrigation indicating no significant active bleeding.  Unfortunately Cr continues to rise despite removing possibly clogged stent yesterday after hydro was found on renal US. He has no flank pain  Active Problems:   Acute on chronic systolic (congestive) heart failure (HCC)   Acute on chronic systolic heart failure (HCC)  Current Facility-Administered Medications  Medication Dose Route Frequency Provider Last Rate Last Dose  . 0.9 %  sodium chloride infusion  250 mL Intravenous PRN Bensimhon, Shaune Pascal, MD   Stopped at 12/04/17 1330  . 0.9 %  sodium chloride infusion   Intravenous Once Kyung Rudd, CRNA      . acetaminophen (TYLENOL) tablet 650 mg  650 mg Oral Q4H PRN Bensimhon, Shaune Pascal, MD      . acetaminophen (TYLENOL) tablet 650 mg  650 mg Oral Q4H PRN Clegg, Amy D, NP      . amiodarone (PACERONE) tablet 200 mg  200 mg Oral Daily Larey Dresser, MD   200 mg at 12/09/17 0920  . atorvastatin (LIPITOR) tablet 80 mg  80 mg Oral q1800 Larey Dresser, MD   80 mg at 12/08/17 1801  . cefTRIAXone (ROCEPHIN) 1 g in dextrose 5 % 50 mL IVPB  1 g Intravenous Q24H Bensimhon, Shaune Pascal, MD   Stopped at 12/09/17 [phone]  . docusate sodium (COLACE) capsule 200 mg  200 mg Oral Daily Larey Dresser, MD   200 mg at 12/09/17 0919  . guaiFENesin (ROBITUSSIN) 100 MG/5ML solution 100 mg  5 mL Oral Q4H PRN Bensimhon, Shaune Pascal, MD   100 mg at 12/08/17 2300  . lactated ringers infusion    Intravenous Continuous Effie Berkshire, MD 0 mL/hr at 12/04/17 1226    . levothyroxine (SYNTHROID, LEVOTHROID) tablet 150 mcg  150 mcg Oral QAC breakfast Einar Grad, RPH   150 mcg at 12/09/17 3762  . milrinone (PRIMACOR) 20 MG/100 ML (0.2 mg/mL) infusion  0.1 mcg/kg/min Intravenous Continuous Bensimhon, Shaune Pascal, MD 2.6 mL/hr at 12/08/17 2000 0.1 mcg/kg/min at 12/08/17 2000  . ondansetron (ZOFRAN) injection 4 mg  4 mg Intravenous Q6H PRN Bensimhon, Shaune Pascal, MD      . ondansetron [facility]) injection 4 mg  4 mg Intravenous Q6H PRN Clegg, Amy D, NP   4 mg at 12/08/17 1646  . pantoprazole (PROTONIX) EC tablet 40 mg  40 mg Oral Daily Larey Dresser, MD   40 mg at 12/09/17 0919  . ranolazine (RANEXA) 12 hr tablet 500 mg  500 mg Oral BID Larey Dresser, MD   500 mg at 12/09/17 0919  . sildenafil (REVATIO) tablet 20 mg  20 mg Oral TID Bensimhon, Shaune Pascal, MD   20 mg at 12/09/17 0919  . sodium chloride flush (NS) 0.9 % injection 10-40 mL  10-40 mL Intracatheter PRN Bensimhon, Shaune Pascal, MD   10 mL at 12/07/17 2121  . sodium chloride flush (NS) 0.9 % injection 3 mL  3 mL Intravenous Q12H Bensimhon, Shaune Pascal, MD   3 mL at 12/09/17 0929  . sodium chloride flush (  NS) 0.9 % injection 3 mL  3 mL Intravenous PRN Bensimhon, Shaune Pascal, MD      . tamsulosin [facility]) capsule 0.4 mg  0.4 mg Oral Daily Larey Dresser, MD   0.4 mg at 12/09/17 0919  . Warfarin - Pharmacist Dosing Inpatient   Does not apply q1800 Larey Dresser, MD         Objective: Vital: Vitals:   12/08/17 1922 12/09/17 0358 12/09/17 0410 12/09/17 0807  BP: 97/85  94/76 91/75  Pulse:   (!) 47 73  Resp:   16 14  Temp: 98.8 F (37.1 C)  98 F (36.7 C) 98.3 F (36.8 C)  TempSrc: Oral   Oral  SpO2: 95%  95% 96%  Weight:  89.8 kg (197 lb 15.6 oz)    Height:       I/Os: I/O last 3 completed shifts: In: 1863.8 [I.V.:48.8; Blood:315; Other:1500] Out: 3825 [Urine:3825]  Physical Exam:  General: Patient is in no apparent  distress Lungs: Normal respiratory effort, chest expands symmetrically. GI: The abdomen is soft and nontender without mass. JP drain with serosanguinous drainage Foley: 24F 3 way catheter draining dark red urine quickly flushed clear after 300cc irrigation. No significant clots except some tiny ones.   Ext: lower extremities symmetric  Lab Results: Recent Labs    12/07/17 0212 12/08/17 0505 12/09/17 0418  WBC 9.1 8.1 7.6  HGB 9.0* 8.5* 9.2*  HCT 28.5* 26.6* 28.7*   Recent Labs    12/07/17 0212 12/08/17 0505 12/09/17 0418  NA 132* 132* 134*  K 4.6 4.5 4.3  CL 102 100* 103  CO2 21* 21* 19*  GLUCOSE 131* 123* 126*  BUN 27* 40* 49*  CREATININE 2.47* 3.92* 4.89*  CALCIUM 8.4* 8.2* 8.3*   Recent Labs    12/07/17 0212 12/08/17 0505 12/09/17 0418  INR 1.52 1.90 2.11   No results for input(s): LABURIN in the last 72 hours. Results for orders placed or performed during the hospital encounter of 11/29/17  MRSA PCR Screening     Status: None   Collection Time: 11/29/17  5:47 PM  Result Value Ref Range Status   MRSA by PCR NEGATIVE NEGATIVE Final    Comment:        The GeneXpert MRSA Assay (FDA approved for NASAL specimens only), is one component of a comprehensive MRSA colonization surveillance program. It is not intended to diagnose MRSA infection nor to guide or monitor treatment for MRSA infections.   Culture, Urine     Status: Abnormal   Collection Time: 12/02/17  1:30 PM  Result Value Ref Range Status   Specimen Description URINE, CLEAN CATCH  Final   Special Requests NONE  Final   Culture <10,000 COLONIES/mL INSIGNIFICANT GROWTH (A)  Final   Report Status 12/03/2017 FINAL  Final   *Note: Due to a large number of results and/or encounters for the requested time period, some results have not been displayed. A complete set of results can be found in Results Review.    Studies/Results: US Renal  Result Date: 12/08/2017 CLINICAL DATA:  Acute kidney injury,  recent ureteral stent, ongoing hematuria, reassess for hydronephrosis EXAM: RENAL / URINARY TRACT ULTRASOUND COMPLETE COMPARISON:  12/06/2017 FINDINGS: Right Kidney: Length: 10.9 cm. Cortical thinning. Echogenic renal parenchyma. 7.2 x 6.0 x 6.2 cm lower pole cyst, simple. Additional 1.8 x 1.8 x 2.1 cm lower pole cyst. No hydronephrosis. Left Kidney: Length: 12.1 cm. Cortical thinning. Echogenic renal parenchyma. 3.4 x 3.2 x 3.5 cm  interpolar cyst, simple. Moderate hydroureteronephrosis. Bladder: Decompressed by indwelling Foley catheter. IMPRESSION: Moderate left hydroureteronephrosis, new/recurrent. Bilateral renal cysts, unchanged from recent studies, as above. Cortical thinning with a echogenic renal parenchyma, suggesting medical renal disease. Electronically Signed   By: Julian Hy M.D.   On: 12/08/2017 11:22   Procedure: Under clean conditions, I manually irrigated the bladder with a catheter tipped syringe with sterile water until output was clear (about 300cc)  Assessment/Plan: High grade T1 UCC--plan for outpatient BCG once patient stabilizes and hematuria resolved Gross hematuria--appears to be improving as bladder quickly flushed clear AKI--worsening despite stent removal. Nephrology planning renal US today. I suspect this is more prerenal or renal rather than obstructive in nature given worsening creatinine with normal [place] on 2/1. May have had a component of obstruction this weekend given hydro on [place] despite stent (? Clog with blood clot). Will follow-up on renal US.   Procedure(s): CYSTOSCOPY WITH left RETROGRADE PYELOGRAM, LEFT URETEROSCOPY AND STENT PLACEMENT HOLMIUM LASER APPLICATION TRANSURETHRAL RESECTION OF BLADDER TUMOR (TURBT), 5 Days Post-Op    Link Snuffer, MD Urology 12/09/2017, 11:05 AM

## 2017-12-09 NOTE — Progress Notes (Addendum)
Re-examined patient. States he is not feeling well. Nursing reports some nausea/vomiting. Denies flank pain. Reports feeling a great deal of fatigue. Reported UOP from nurse was ~275cc this shift since 7 or 8am (which may actually be over-reported given I flushed his catheter at 10:30 this morning with sterile water and again at 5:45pm).   I flushed his catheter. Urine cleared up after only 100cc sterile water.   Repeat renal US as well as repeat am labs are pending

## 2017-12-09 NOTE — Progress Notes (Signed)
Advanced Heart Failure VAD Team Note  Subjective:    Started Rocephin 12/03/17   CT Renal Stone. Stable L ureteral stone non obstructing. Increasing filling defect within the urinary bladder suspicious for an underlying mass. Although this may simply represent a hematoma,urologic consultation and direct visualization is recommended.  Cystocopy 12/04/17  - s/p Left ureteroscopy and stent placement.  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 Yesterday stent removed. Had lower abdominal pain so bladder irrigation started. Unfortunately he had a blockage so irrigation was stopped. On going hematuria.   Denies SOB.   hgb. 9.4-> 9.0.-> 8.5->9.2   LDH 269 -> 285 -> 319 ->247-> 250->259  LVAD INTERROGATION:  HeartMate II LVAD:  Flow 4.4 liters/min, speed 8800, power 4.8  PI 7  Objective:    Vital Signs:   Temp:  [98 F (36.7 C)-99 F (37.2 C)] 98.3 F (36.8 C) (02/04 0807) Pulse Rate:  [47-73] 73 (02/04 0807) Resp:  [13-16] 14 (02/04 0807) BP: (73-97)/(60-85) 91/75 (02/04 0807) SpO2:  [93 %-99 %] 96 % (02/04 0807) Weight:  [197 lb 15.6 oz (89.8 kg)] 197 lb 15.6 oz (89.8 kg) (02/04 0358) Last BM Date: 12/06/17 Mean arterial Pressure Maps 80s  Intake/Output:   Intake/Output Summary (Last 24 hours) at 12/09/2017 0937 Last data filed at 12/09/2017 0403 Gross per 24 hour  Intake 1848.8 ml  Output 3300 ml  Net -1451.2 ml     Physical Exam     Physical Exam: GENERAL: NAD. In bed.  HEENT: normal  NECK: Supple, JVP 7-8.  2+ bilaterally, no bruits.  No lymphadenopathy or thyromegaly appreciated.   CARDIAC:  Mechanical heart sounds with LVAD hum present.  LUNGS:  Clear to auscultation bilaterally.  ABDOMEN:  Soft, round, nontender, positive bowel sounds x4.     LVAD exit site: well-healed and incorporated.  Dressing dry and intact.  No erythema or drainage.  Stabilization device present and accurately applied.  Driveline  dressing is being changed daily per sterile technique. EXTREMITIES:  Warm and dry, no cyanosis, clubbing, rash or edema . RUE midline NEUROLOGIC:  Alert and oriented x 4.  Gait steady.  No aphasia.  No dysarthria.  Affect pleasant.  GU: hematuria.     Telemetry    NSr 60-70s personally reviewed.  EKG    No new tracings.    Labs   Basic Metabolic Panel: Recent Labs  Lab 12/05/17 0241 12/05/17 1016 12/06/17 0327 12/07/17 0212 12/08/17 0505 12/09/17 0418  NA  --  135 134* 132* 132* 134*  K  --  4.6 4.5 4.6 4.5 4.3  CL  --  107 103 102 100* 103  CO2  --  19* 19* 21* 21* 19*  GLUCOSE  --  145* 124* 131* 123* 126*  BUN  --  24* 27* 27* 40* 49*  CREATININE  --  2.21* 2.46* 2.47* 3.92* 4.89*  CALCIUM  --  8.7* 8.8* 8.4* 8.2* 8.3*  MG 2.0  --   --   --   --   --     Liver Function Tests: No results for input(s): AST, ALT, ALKPHOS, BILITOT, PROT, ALBUMIN in the last 168 hours. No results for input(s): LIPASE, AMYLASE in the last 168 hours. No results for input(s): AMMONIA in the last 168 hours.  CBC: Recent Labs  Lab 12/05/17 0943 12/06/17 0326 12/07/17 0212 12/08/17 0505 12/09/17 0418  WBC 11.6* 13.5* 9.1 8.1 7.6  NEUTROABS 10.0* 10.3* 6.7 5.9 5.4  HGB 9.4* 9.8* 9.0* 8.5* 9.2*  HCT 30.2* 31.9* 28.5* 26.6* 28.7*  MCV 88.8 90.4 89.6 89.0 88.3  PLT 165 200 158 146* 163    INR: Recent Labs  Lab 12/05/17 0241 12/06/17 0327 12/07/17 0212 12/08/17 0505 12/09/17 0418  INR 1.33 1.27 1.52 1.90 2.11    Other results:  EKG:    Imaging   US Renal  Result Date: 12/08/2017 CLINICAL DATA:  Acute kidney injury, recent ureteral stent, ongoing hematuria, reassess for hydronephrosis EXAM: RENAL / URINARY TRACT ULTRASOUND COMPLETE COMPARISON:  12/06/2017 FINDINGS: Right Kidney: Length: 10.9 cm. Cortical thinning. Echogenic renal parenchyma. 7.2 x 6.0 x 6.2 cm lower pole cyst, simple. Additional 1.8 x 1.8 x 2.1 cm lower pole cyst. No hydronephrosis. Left Kidney: Length:  12.1 cm. Cortical thinning. Echogenic renal parenchyma. 3.4 x 3.2 x 3.5 cm interpolar cyst, simple. Moderate hydroureteronephrosis. Bladder: Decompressed by indwelling Foley catheter. IMPRESSION: Moderate left hydroureteronephrosis, new/recurrent. Bilateral renal cysts, unchanged from recent studies, as above. Cortical thinning with a echogenic renal parenchyma, suggesting medical renal disease. Electronically Signed   By: Julian Hy M.D.   On: 12/08/2017 11:22     Medications:     Scheduled Medications: . amiodarone  200 mg Oral Daily  . atorvastatin  80 mg Oral q1800  . docusate sodium  200 mg Oral Daily  . levothyroxine  150 mcg Oral QAC breakfast  . pantoprazole  40 mg Oral Daily  . ranolazine  500 mg Oral BID  . sildenafil  20 mg Oral TID  . sodium chloride flush  3 mL Intravenous Q12H  . tamsulosin  0.4 mg Oral Daily  . Warfarin - Pharmacist Dosing Inpatient   Does not apply q1800    Infusions: . sodium chloride Stopped (12/04/17 1330)  . sodium chloride    . cefTRIAXone (ROCEPHIN)  IV Stopped (12/09/17 6440)  . lactated ringers 0 mL/hr at 12/04/17 1226  . milrinone 0.1 mcg/kg/min (12/08/17 2000)    PRN Medications: sodium chloride, acetaminophen, acetaminophen, guaiFENesin, ondansetron (ZOFRAN) IV, ondansetron (ZOFRAN) IV, sodium chloride flush, sodium chloride flush   VAD EVENTS   GU Event 09/01/14 had impacted ureteral stone and underwent cystoscopy and flexible ureteroscopy with lithotripsy and basket extraction.   09/2015 Hematuria and chronic nephrolithiasis. Renal US showed mild hyronephrosis and bilateral renal cysts. Dr. Risa Grill took back for repeat lithotripsy and stone extraction by ureteroscopy and J stent placement. 12/18 Recurrent hematuria. 57mm left ureteral stone. 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  4/2-18 - Episode of melena. Hgb 10.9-> 8.7. EGD/colonoscopy  negative 12/18 - Melena with syncope  Hgb 6.5. Transfused 5u. 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  01/2016 R CEA. He was started on Plavix.  01/2016 Lumbar fusion L3-L5 with pedicle screws posterolateral arthrodesis with BMP, allograft  Patient Profile  [name] is a 77 y.o. 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 Required short term milrinone.   Admitted 1/25 for elective ureteroscopy due to R ureteral stone.  Still with occasional hematuria. Continues with fatigue. RHC 1/25 showed low output due to RV failure (see below). Milrinone initiated at 0.125.  Assessment/Plan:    1. 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 Struggling with progressive NYHA III symptoms.  RHC 1/25 showed primarily RHF with low output.  - No real symptomatic improvement with milrinone. Continue for now at 0.1 with AKI.  - Volume status stable.  - Continue Sildenafil 20 mg TID with RV failure.  - Continue VAD speed at 8800. VAD interrogated personally. Parameters stable. 2. Recurrent renal stone with ongoing hematuria and UTI: Left ureteral stone in 12/18. - Last dose of coumadin 1-24/2019 - 12/03/2017. CT with L stone and possible mass - Cystoscopy + Left Ureteroscopy with stent placed 12/04/17. - Started rocephin 12/03/17. Ucx negative.  - Still with gross hematuria.  - Stent removed by Dr Jeffie Pollock 2/3  - off heparin.  3. AKI - Creatinine continues to rise.  3.9>4.9 .  - Renal u/s without obstruction on 2/1. Given ongoing hematuria with clots will repeat today to exclude obstruction. Receiving continuous bladder irrigation.  - Possible AKI from OR (ATN)  -Nephrology following.  4. Bladder Tumor - Seen on CT 1/29 and confirmed/resected on  cystoscopy 12/04/17. - Pathology c/w high-grade urothelial CA. Discussed with Dr. Gloriann Loan. Plan outpatient BCG treatment. 5. LVAD:  - VAD interrogated personally. Parameters stable.  - LDH stable.  -off heparin. INR stable 2.11 . Starting to Ensure will need to watch INR closely due to vitamin K.  6. H/o GIB: S/p clipping of 2 jejunal AVMs in 4/16. Continue coumadin and plavix (with h/o CVA). Admitted 4/18 with recurrent GIB. Transfused 2u. EGD/colon negative. Admitted 12/18, Hgb 6.5, Transfused 5u. EGD with 2 AVMs treated with APC - Hgb stable 9.2  7.  Atrial arrhythmias: Paroxysmal atrial fibrillation, atrial tachycardia.  S/p DC-CV 06/04/14, DC-CV 03/02/15 and 01/19/16.  - remains in NSR.  - Continue amiodarone.  8. HTN:  Stable.  9. 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 No further.  - Continue atorvastatin. (also warfarin INR goal 2-2.5). S/p R CEA 01/13/16.  - Plavix stopped 12/18 due to recurrent GIB 10. H/o amio-induced hypothyroidism: Amio restarted due to recurrent atrial arrhythmias and VT.  - Continue home levothyroxine dose. No change.  11. VT:  - Underwent DC-CV 9/17. Repeat episode of slow VT 08/17/17 requiring DC-CV.  - Continue amio 200 daily.  - Goal K > 4.0 Mg > 2.0. Stable.   Length of Stay: [place], NP 12/09/2017, 9:37 AM  VAD Team --- VAD ISSUES ONLY--- Pager [phone] (7am - 7am)  Advanced Heart Failure Team  Pager [phone] (M-F; 7a - 4p)   Please contact [facility] for night-coverage after hours (4p -7a ) and weekends on [email or web address]  Patient seen and examined with the above-signed Advanced Practice Provider and/or Housestaff. I personally reviewed laboratory data, imaging studies and relevant notes. I independently examined the patient and formulated the important aspects of the plan. I have edited the note to reflect any of my changes or salient points. I have personally discussed the plan with the patient and/or family.  He is feeling very poorly today.  Continues with gross hematuria. Has n/v today. Creatinine getting worse. No other evidence of uremia. Will hold coumadin for possible trialysis placement. LDH ok.   Volume status stable. VAD interrogated personally. Parameters stable.  Ureteral stent removed but concern remains for residual obstruction.   Glori Bickers, MD  5:13 PM

## 2017-12-10 ENCOUNTER — Inpatient Hospital Stay (HOSPITAL_COMMUNITY): Payer: Medicare Other

## 2017-12-10 ENCOUNTER — Encounter (HOSPITAL_COMMUNITY): Payer: Self-pay | Admitting: Interventional Radiology

## 2017-12-10 HISTORY — PX: IR US GUIDE VASC ACCESS RIGHT: IMG2390

## 2017-12-10 HISTORY — PX: IR FLUORO GUIDE CV LINE RIGHT: IMG2283

## 2017-12-10 LAB — BASIC METABOLIC PANEL
Anion gap: 14 (ref 5–15)
BUN: 65 mg/dL — AB (ref 6–20)
CALCIUM: 8.3 mg/dL — AB (ref 8.9–10.3)
CO2: 20 mmol/L — AB (ref 22–32)
Chloride: 101 mmol/L (ref 101–111)
Creatinine, Ser: 6.25 mg/dL — ABNORMAL HIGH (ref 0.61–1.24)
GFR calc Af Amer: 9 mL/min — ABNORMAL LOW (ref >60)
GFR, EST NON AFRICAN AMERICAN: 8 mL/min — AB (ref >60)
Glucose, Bld: 126 mg/dL — ABNORMAL HIGH (ref 65–99)
POTASSIUM: 4.5 mmol/L (ref 3.5–5.1)
SODIUM: 135 mmol/L (ref 135–145)

## 2017-12-10 LAB — CBC WITH DIFFERENTIAL/PLATELET
BASOS ABS: 0 10*3/uL (ref 0.0–0.1)
BASOS PCT: 0 %
EOS ABS: 0.1 10*3/uL (ref 0.0–0.7)
Eosinophils Relative: 1 %
HEMATOCRIT: 28.3 % — AB (ref 39.0–52.0)
Hemoglobin: 8.9 g/dL — ABNORMAL LOW (ref 13.0–17.0)
Lymphocytes Relative: 7 %
Lymphs Abs: 0.6 10*3/uL — ABNORMAL LOW (ref 0.7–4.0)
MCH: 27.7 pg (ref 26.0–34.0)
MCHC: 31.4 g/dL (ref 30.0–36.0)
MCV: 88.2 fL (ref 78.0–100.0)
MONO ABS: 1.8 10*3/uL — AB (ref 0.1–1.0)
Monocytes Relative: 21 %
Neutro Abs: 6.2 10*3/uL (ref 1.7–7.7)
Neutrophils Relative %: 71 %
PLATELETS: 182 10*3/uL (ref 150–400)
RBC: 3.21 MIL/uL — ABNORMAL LOW (ref 4.22–5.81)
RDW: 17.5 % — AB (ref 11.5–15.5)
WBC: 8.7 10*3/uL (ref 4.0–10.5)

## 2017-12-10 LAB — PROTIME-INR
INR: 2.35
Prothrombin Time: 25.5 seconds — ABNORMAL HIGH (ref 11.4–15.2)

## 2017-12-10 LAB — COOXEMETRY PANEL
Carboxyhemoglobin: 2.5 % — ABNORMAL HIGH (ref 0.5–1.5)
METHEMOGLOBIN: 0.7 % (ref 0.0–1.5)
O2 Saturation: 81.6 %
TOTAL HEMOGLOBIN: 9 g/dL — AB (ref 12.0–16.0)

## 2017-12-10 LAB — LACTATE DEHYDROGENASE: LDH: 253 U/L — AB (ref 98–192)

## 2017-12-10 MED ORDER — SODIUM CHLORIDE 0.9 % IV SOLN: 100.0000 mL | INTRAVENOUS | Status: DC | PRN

## 2017-12-10 MED ORDER — LIDOCAINE HCL (PF) 1 % IJ SOLN: 5.0000 mL | INTRAMUSCULAR | Status: DC | PRN

## 2017-12-10 MED ORDER — LIDOCAINE HCL 1 % IJ SOLN
INTRAMUSCULAR | Status: AC
  Filled 2017-12-10: qty 20

## 2017-12-10 MED ORDER — ALTEPLASE 2 MG IJ SOLR: 2.0000 mg | Freq: Once | INTRAMUSCULAR | Status: DC | PRN

## 2017-12-10 MED ORDER — HEPARIN SODIUM (PORCINE) 1000 UNIT/ML IJ SOLN
INTRAMUSCULAR | Status: DC | PRN
  Administered 2017-12-10: 2800 [IU] via INTRAVENOUS

## 2017-12-10 MED ORDER — HEPARIN SODIUM (PORCINE) 1000 UNIT/ML DIALYSIS
1000.0000 [IU] | INTRAMUSCULAR | Status: DC | PRN
Start: 2017-12-10 — End: 2017-12-17

## 2017-12-10 MED ORDER — HEPARIN SODIUM (PORCINE) 1000 UNIT/ML IJ SOLN
INTRAMUSCULAR | Status: AC
Start: 2017-12-10 — End: 2017-12-11
  Filled 2017-12-10: qty 1

## 2017-12-10 MED ORDER — PENTAFLUOROPROP-TETRAFLUOROETH EX AERO
1.0000 | INHALATION_SPRAY | CUTANEOUS | Status: DC | PRN
Start: 2017-12-10 — End: 2017-12-17
  Filled 2017-12-10: qty 30

## 2017-12-10 MED ORDER — LIDOCAINE HCL (PF) 1 % IJ SOLN
INTRAMUSCULAR | Status: DC | PRN
  Administered 2017-12-10: 2 mL

## 2017-12-10 MED ORDER — LIDOCAINE-PRILOCAINE 2.5-2.5 % EX CREA
1.0000 "application " | TOPICAL_CREAM | CUTANEOUS | Status: DC | PRN
  Filled 2017-12-10: qty 5

## 2017-12-10 NOTE — Procedures (Signed)
Interventional Radiology Procedure Note  Procedure: Right IJ non-tunneled temporary HD catheter placement  Complications: None  Estimated Blood Loss: < 10 mL  Findings: 20 cm, triple lumen Mahurker catheter placed via right IJ vein.  Tip at SVC/RA junction.  OK to use.  Venetia Night. Kathlene Cote, M.D Pager:  [phone]

## 2017-12-10 NOTE — Progress Notes (Signed)
Dialysis treatment completed.  1000 mL ultrafiltrated.  500 mL net fluid removal.  Patient status unchanged. Lung sounds diminished and clear to ausculation in all fields. No edema. Cardiac: NSR.  Cleansed RIJ catheter with chlorhexidine.  Disconnected lines and flushed ports with saline per protocol.  Ports locked with heparin and capped per protocol.    Report given to bedside, RN Janett Billow.

## 2017-12-10 NOTE — Progress Notes (Signed)
LVAD Coordinator Rounding Note:  Admitted 11/29/17 due for elective ureteroscopy due to right ureteral stone. RHC showed low output due to RV failure. Milrinone initiated.   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 Cyndia Bent with oversewn aortic valve under DT criteria.  Pt is post op day 6  from left ureteroscopy with laser lithotripsy, retrograde peylogram, left ureteral stent, and  resection of bladder tumor. Pt denies any pain. Pt states that he is extremely weak. Pt was able to eat a little breakfast this morning. Pt has not eaten in 2 days. Foley cath with dark brown urine; Dr. Gloriann Loan has already flushed catheter this morning.    Vital signs: Temp: 98 HR: 60 Doppler Pressure:  84 Automatic BP:  89/76 (82) O2 Sat:  95% on RA   Weight: 194>191>196>197>196>197>199>200>197>195 lbs  GTTS: Milrinone 0.1 mcg/kg/min Heparin - 12/06/17 off 12/08/17  LVAD interrogation reveals:  Speed: 8800 Flow:  4.6 Power:  4.9w PI: 6.2 Alarms: none Events:  none  Fixed speed: 8800 Low speed limit: 8200  Drive Line: Sorbaview dressing dry and intact; anchor intact and accurately applied. Weekly dressing changes; Last changed 12/08/17 by wife. Next change is due 12/15/17.  Labs:  LDH trend: 362>332>270>275>280>269>285>319>259>253  INR trend: 2.20>2.12>1.89>1.90>1.75>1.58>1.48>1.33>1.27>2.11>2.35  Creat trend: 1.46>1.33>2.21>2.46>6.25  Anticoagulation Plan: - INR Goal:  2.0 - 2.5 - ASA Dose: no ASA (removed 02/2017 due to GI bleed)  Device: -Dual Medtronic ICD -Therapies: on at 200 bpm - Last device check 10/02/17  Significant Events on VAD Support:  09/01/14>>ureteral stone with cystoscopy and flexible ureteroscopy with lithotripsy and basket extraction 02/2015 >>GIB with 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 Started on Plavix 3/17>>Lumbar fusion  L3-L5 9/17>>VT with DC-CV; amio increased 10/17>>ramp with RV failure; speed decreased to 8800 02/2017> GIB (removed ASA, scopes negative) 03/2017>percutaneous lead repair after pump stops  10/18>>Slow VT. Cardioverted in ED and amiodarone uptitrated 12/18>>melena with scope. Hgb 6.5. EGD with 2 AVMs with APC 12/18>>recurrent hematuria. 6 mm left ureteral stone 1/19>>7 mm left ureteral stone removal with laser lithotripsy; left ureteral stent. Resection of 6 cm bladder tumor   Plan/Recommendations:   1. Pt scheduled for tunneled dialysis catheter today in IR. Please page VAD coordinator. 2. CT today. 3. Pt may possibly go the OR tomorrow-VAD coordinator will accompany pt.  4. Please call VAD pager if any issues with VAD equipment or questions, concerns.  Tanda Rockers RN, BSN VAD Coordinator 24/7 Pager [phone]

## 2017-12-10 NOTE — Progress Notes (Signed)
VAD coordinator accompanied pt to IR for temporary dialysis catheter placement. Pt did not require any sedation. Pt was monitored throughout the procedure without complications. Pt transferred back to [facility] and handoff given to Anda Kraft, Therapist, sports.   Tanda Rockers RN, BSN VAD Coordinator 24/7 Pager [phone]

## 2017-12-10 NOTE — Progress Notes (Signed)
[name] Progress Note    Assessment/ Plan:   Frank Monger McBrideis a 77 y.o.malewith h/o VT, HTN, HLD, hx AAA repair, CKD III (baseline Cr 1.2-1.5), hx of acute GIBs 2/2 AVMs, PAD and severe systolic HF (EF 15-40%) due to mixed ischemic/nonischemic CM with LVAD managed by Dr. Haroldine Laws presenting for hematuria and elective uteroscopy with need for management of his anticoagulation prior to surgery now with AKI on CKD 2/2 likely hemodynamic insult and obstruction.   AKI on CKD III: Likely 2/2 combined hemodynamic insult during surgery and acute L moderate hydronephrosis seen on [place] 2/3 (stent subsequently removed with clot present). No acute indication for CRRT. Cr worsened to 6.25. +Nausea/vomiting 2/4. Renal US now with bilateral hydronephrosis, persistent on L. Now oliguric.  -daily RFPs -IR to place access for CRRT vs HD today  Bilateral hydronephrosis: per urology. Question need for repeat intervention, although appreciate tenuous surgical status given LVAD.  -per urology  Acute on Chronic biventricular systolic HF: managed by advanced HF team.  -per primary team  Recurrent renal stone with hematuria and UTI -per urology  Bladder tumor -per urology  LVAD per HF team  Subjective:   Denies pain, but N/V yesterday afternoon and overnight, some help w phenergan. Somnolent this am.    Objective:   BP (!) 84/72 (BP Location: Left Arm)   Pulse 65   Temp 97.9 F (36.6 C) (Axillary)   Resp 12   Ht 6' (1.829 m)   Wt 195 lb 15.8 oz (88.9 kg)   SpO2 91%   BMI 26.58 kg/m   Intake/Output Summary (Last 24 hours) at 12/10/2017 0636 Last data filed at 12/10/2017 0414 Gross per 24 hour  Intake 370.6 ml  Output 675 ml  Net -304.4 ml   Weight change: -15.8 oz (-0.9 kg)  Physical Exam: GQQ:PYPP elderly male lying in bed in NAD CVS: LVAD with single mechanical heart sound.  Resp: CTAB easy WOB Abd: soft, Nontender Ext: no edema  Imaging: US Renal  Result  Date: 12/08/2017 CLINICAL DATA:  Acute kidney injury, recent ureteral stent, ongoing hematuria, reassess for hydronephrosis EXAM: RENAL / URINARY TRACT ULTRASOUND COMPLETE COMPARISON:  12/06/2017 FINDINGS: Right Kidney: Length: 10.9 cm. Cortical thinning. Echogenic renal parenchyma. 7.2 x 6.0 x 6.2 cm lower pole cyst, simple. Additional 1.8 x 1.8 x 2.1 cm lower pole cyst. No hydronephrosis. Left Kidney: Length: 12.1 cm. Cortical thinning. Echogenic renal parenchyma. 3.4 x 3.2 x 3.5 cm interpolar cyst, simple. Moderate hydroureteronephrosis. Bladder: Decompressed by indwelling Foley catheter. IMPRESSION: Moderate left hydroureteronephrosis, new/recurrent. Bilateral renal cysts, unchanged from recent studies, as above. Cortical thinning with a echogenic renal parenchyma, suggesting medical renal disease. Electronically Signed   By: Julian Hy M.D.   On: 12/08/2017 11:22    Labs: BMET Recent Labs  Lab 12/04/17 0238 12/05/17 1016 12/06/17 0327 12/07/17 0212 12/08/17 0505 12/09/17 0418 12/10/17 0430  NA 134* 135 134* 132* 132* 134* 135  K 4.1 4.6 4.5 4.6 4.5 4.3 4.5  CL 105 107 103 102 100* 103 101  CO2 21* 19* 19* 21* 21* 19* 20*  GLUCOSE 110* 145* 124* 131* 123* 126* 126*  BUN 15 24* 27* 27* 40* 49* 65*  CREATININE 1.33* 2.21* 2.46* 2.47* 3.92* 4.89* 6.25*  CALCIUM 8.3* 8.7* 8.8* 8.4* 8.2* 8.3* 8.3*   CBC Recent Labs  Lab 12/07/17 0212 12/08/17 0505 12/09/17 0418 12/10/17 0430  WBC 9.1 8.1 7.6 8.7  NEUTROABS 6.7 5.9 5.4 6.2  HGB 9.0* 8.5* 9.2* 8.9*  HCT 28.5* 26.6* 28.7* 28.3*  MCV 89.6 89.0 88.3 88.2  PLT 158 146* 163 182    Medications:    . amiodarone  200 mg Oral Daily  . atorvastatin  80 mg Oral q1800  . docusate sodium  200 mg Oral Daily  . levothyroxine  150 mcg Oral QAC breakfast  . mouth rinse  15 mL Mouth Rinse BID  . pantoprazole  40 mg Oral Daily  . ranolazine  500 mg Oral BID  . sildenafil  20 mg Oral TID  . sodium chloride flush  3 mL Intravenous Q12H   . tamsulosin  0.4 mg Oral Daily  . Warfarin - Pharmacist Dosing Inpatient   Does not apply q1800    Ralene Ok, MD 12/10/17

## 2017-12-10 NOTE — Progress Notes (Signed)
Arrived to patient room 2C-09 at [facility].  Reviewed treatment plan and this RN agrees with plan.  Report received from bedside RN, Anda Kraft.  Consent obtained.  Patient A & O X 4.   Lung sounds clear and diminished to ausculation in all fields. No edema. Cardiac:  NSr.  Removed caps and cleansed RIJ catheter with chlorhedxidine.  Aspirated ports of heparin and flushed them with saline per protocol.  Connected and secured lines, initiated treatment at [place].  UF Goal of 1000 mL and net fluid removal 0.5 L.  Will continue to monitor.

## 2017-12-10 NOTE — Progress Notes (Signed)
Advanced Heart Failure VAD Team Note  Subjective:    Started Rocephin 12/03/17   CT Renal Stone. Stable L ureteral stone non obstructing. Increasing filling defect within the urinary bladder suspicious for an underlying mass. Although this may simply represent a hematoma,urologic consultation and direct visualization is recommended.  Cystocopy 12/04/17  - s/p Left ureteroscopy and stent placement.  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 Had lower abdominal pain so bladder irrigation started. Unfortunately he had a blockage so irrigation was stopped.   Feels terrible. Weak. Nauseated and sore.  Minimal urine output. All gross hematuria. Repeat renal u/s now with mild bilateral hydro. Denies fevers or flank pain.  Creatinine up to 6.2. INR 2.3    hgb. 9.4-> 9.0.-> 8.5->9.2 -> 8.9  LDH 269 -> 285 -> 319 ->247-> 250->259 -> 253 LVAD INTERROGATION:  HeartMate II LVAD:  Flow 4.3 liters/min, speed 8800, power 4.7  PI 6.5  Objective:    Vital Signs:   Temp:  [97.9 F (36.6 C)-99.8 F (37.7 C)] 98 F (36.7 C) (02/05 0757) Pulse Rate:  [42-74] 65 (02/05 0838) Resp:  [10-15] 15 (02/05 0838) BP: (84-101)/(68-85) 89/76 (02/05 0757) SpO2:  [89 %-95 %] 95 % (02/05 0838) Weight:  [88.9 kg (195 lb 15.8 oz)] 88.9 kg (195 lb 15.8 oz) (02/05 0501) Last BM Date: 12/06/17 Mean arterial Pressure Maps 70-80s  Intake/Output:   Intake/Output Summary (Last 24 hours) at 12/10/2017 0900 Last data filed at 12/10/2017 0414 Gross per 24 hour  Intake 370.6 ml  Output 675 ml  Net -304.4 ml     Physical Exam     Physical Exam: General:  Weak appearing. Lying in bed.  HEENT: normal  Neck: supple. JVP 7  Carotids 2+ bilat; no bruits. No lymphadenopathy or thryomegaly appreciated. Cor: LVAD hum.  Lungs: Clear. Abdomen: obese soft, nontender, non-distended. No hepatosplenomegaly. No bruits or masses. Good bowel sounds. Driveline site  clean. Anchor in place.  Extremities: no cyanosis, clubbing, rash. Warm no edema  Foley with gross hematuria Neuro: alert & oriented x 3. No focal deficits. Moves all 4 without problem    Telemetry   NSR 60-70s + PVCs personally reviewed.   EKG    No new tracings.    Labs   Basic Metabolic Panel: Recent Labs  Lab 12/05/17 0241  12/06/17 0327 12/07/17 0212 12/08/17 0505 12/09/17 0418 12/10/17 0430  NA  --    < > 134* 132* 132* 134* 135  K  --    < > 4.5 4.6 4.5 4.3 4.5  CL  --    < > 103 102 100* 103 101  CO2  --    < > 19* 21* 21* 19* 20*  GLUCOSE  --    < > 124* 131* 123* 126* 126*  BUN  --    < > 27* 27* 40* 49* 65*  CREATININE  --    < > 2.46* 2.47* 3.92* 4.89* 6.25*  CALCIUM  --    < > 8.8* 8.4* 8.2* 8.3* 8.3*  MG 2.0  --   --   --   --   --   --    < > = values in this interval not displayed.    Liver Function Tests: No results for input(s): AST, ALT, ALKPHOS, BILITOT, PROT, ALBUMIN in the last 168 hours. No results for input(s): LIPASE, AMYLASE in the last 168 hours. No results for input(s): AMMONIA in the last  168 hours.  CBC: Recent Labs  Lab 12/06/17 0326 12/07/17 0212 12/08/17 0505 12/09/17 0418 12/10/17 0430  WBC 13.5* 9.1 8.1 7.6 8.7  NEUTROABS 10.3* 6.7 5.9 5.4 6.2  HGB 9.8* 9.0* 8.5* 9.2* 8.9*  HCT 31.9* 28.5* 26.6* 28.7* 28.3*  MCV 90.4 89.6 89.0 88.3 88.2  PLT 200 158 146* 163 182    INR: Recent Labs  Lab 12/06/17 0327 12/07/17 0212 12/08/17 0505 12/09/17 0418 12/10/17 0430  INR 1.27 1.52 1.90 2.11 2.35    Other results:  EKG:    Imaging   US Renal  Result Date: 12/10/2017 CLINICAL DATA:  Chronic renal insufficiency, hypertension. EXAM: RENAL / URINARY TRACT ULTRASOUND COMPLETE COMPARISON:  Renal ultrasound of January 05, 2018 FINDINGS: Right Kidney: Length: 11.2 cm. There is diffuse cortical thinning. There is mild hydronephrosis. There is a exophytic upper pole cyst measuring 7.4 cm in greatest dimension. There is a smaller  upper pole cortical cyst measuring 3.8 cm in greatest dimension. Left Kidney: Length: 11.4 cm. There is mild cortical thinning. The cortical echotexture is similar to that on the right. There is mild hydronephrosis. There is a lateral midpole cortical cyst measuring 1.8 cm in diameter. There is a lower pole cyst measuring 3.8 cm in diameter. Bladder: The urinary bladder is decompressed with a Foley catheter. IMPRESSION: Interval development of right-sided hydronephrosis. Stable left-sided hydronephrosis. Bilateral renal cortical cysts and cortical thinning. Electronically Signed   By: David  Martinique M.D.   On: 12/10/2017 07:11   US Renal  Result Date: 12/08/2017 CLINICAL DATA:  Acute kidney injury, recent ureteral stent, ongoing hematuria, reassess for hydronephrosis EXAM: RENAL / URINARY TRACT ULTRASOUND COMPLETE COMPARISON:  12/06/2017 FINDINGS: Right Kidney: Length: 10.9 cm. Cortical thinning. Echogenic renal parenchyma. 7.2 x 6.0 x 6.2 cm lower pole cyst, simple. Additional 1.8 x 1.8 x 2.1 cm lower pole cyst. No hydronephrosis. Left Kidney: Length: 12.1 cm. Cortical thinning. Echogenic renal parenchyma. 3.4 x 3.2 x 3.5 cm interpolar cyst, simple. Moderate hydroureteronephrosis. Bladder: Decompressed by indwelling Foley catheter. IMPRESSION: Moderate left hydroureteronephrosis, new/recurrent. Bilateral renal cysts, unchanged from recent studies, as above. Cortical thinning with a echogenic renal parenchyma, suggesting medical renal disease. Electronically Signed   By: Julian Hy M.D.   On: 12/08/2017 11:22     Medications:     Scheduled Medications: . amiodarone  200 mg Oral Daily  . atorvastatin  80 mg Oral q1800  . docusate sodium  200 mg Oral Daily  . levothyroxine  150 mcg Oral QAC breakfast  . mouth rinse  15 mL Mouth Rinse BID  . pantoprazole  40 mg Oral Daily  . ranolazine  500 mg Oral BID  . sildenafil  20 mg Oral TID  . sodium chloride flush  3 mL Intravenous Q12H  . tamsulosin   0.4 mg Oral Daily  . Warfarin - Pharmacist Dosing Inpatient   Does not apply q1800    Infusions: . sodium chloride Stopped (12/04/17 1330)  . sodium chloride    . cefTRIAXone (ROCEPHIN)  IV Stopped (12/09/17 6720)  . lactated ringers 0 mL/hr at 12/04/17 1226  . milrinone 0.1 mcg/kg/min (12/10/17 0300)    PRN Medications: sodium chloride, acetaminophen, guaiFENesin, ondansetron (ZOFRAN) IV, promethazine, sodium chloride flush, sodium chloride flush   VAD EVENTS   GU Event 09/01/14 had impacted ureteral stone and underwent cystoscopy and flexible ureteroscopy with lithotripsy and basket extraction.   09/2015 Hematuria and chronic nephrolithiasis. Renal US showed mild hyronephrosis and bilateral renal cysts. Dr. Risa Grill took  back for repeat lithotripsy and stone extraction by ureteroscopy and J stent placement. 12/18 Recurrent hematuria. 26mm left ureteral stone. 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  4/2-18 - Episode of melena. Hgb 10.9-> 8.7. EGD/colonoscopy negative 12/18 - Melena with syncope  Hgb 6.5. Transfused 5u. 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  01/2016 R CEA. He was started on Plavix.  01/2016 Lumbar fusion L3-L5 with pedicle screws posterolateral arthrodesis with BMP, allograft  Patient Profile  [name] is a 77 y.o. 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 Required short term milrinone.   Admitted 1/25 for elective ureteroscopy due to R ureteral stone.  Still with occasional hematuria. Continues with fatigue. RHC 1/25 showed low output due to RV failure (see below). Milrinone initiated at 0.125.  Assessment/Plan:    1. Acute on Chronic biventricular  systolic HF: s/p HM-II LVAD 10/2014. Struggling with progressive NYHA III symptoms.  RHC 1/25 showed primarily RHF with low output.  - No real symptomatic improvement with milrinone. Will stop - Volume status stable despite renal failure. - Continue Sildenafil 20 mg TID with RV failure.  - Continue VAD speed at 8800. VAD interrogated personally. Parameters stable. 2. Recurrent renal stone with ongoing hematuria and UTI: Left ureteral stone in 12/18. - Last dose of coumadin 1-24/2019 - 12/03/2017. CT with L stone and possible mass - Cystoscopy + Left Ureteroscopy with stent placed 12/04/17.  - Started rocephin 12/03/17. Ucx negative.  - Still with gross hematuria.  - Stent removed by Dr Jeffie Pollock 2/3  - U/s today with bilateral hydronephrosis - have d/w with Urology. Plan CT today to decide on return to OR vs bilateral perc tubes 3. AKI - Creatinine continues to rise.  3.9>4.9>6.3 - Suspect combination of ATN versus obstruction. Nephrology & Urology following - Plan for Trialysis cath today and HD per Renal 4. Bladder Tumor - Seen on CT 1/29 and confirmed/resected on cystoscopy 12/04/17. - Pathology c/w high-grade urothelial CA. Discussed with Dr. Gloriann Loan. Plan outpatient BCG treatment. - As above. Needs further w/u for obstruction  5. LVAD:  - VAD interrogated personally. Parameters stable.  - LDH stable.  -off heparin. INR stable 2.35 . Hold coumadin for possible return to OR  6. H/o GIB: S/p clipping of 2 jejunal AVMs in 4/16. Continue coumadin and plavix (with h/o CVA). Admitted 4/18 with recurrent GIB. Transfused 2u. EGD/colon negative. Admitted 12/18, Hgb 6.5, Transfused 5u. EGD with 2 AVMs treated with APC - Hgb drifting down to 8.2 7.  Atrial arrhythmias: Paroxysmal atrial fibrillation, atrial tachycardia.  S/p DC-CV 06/04/14, DC-CV 03/02/15 and 01/19/16.  - remains in NSR.  - Continue amiodarone.  8. HTN:  Stable.  9. 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 No further.  - Continue  atorvastatin. (also warfarin INR goal 2-2.5). S/p R CEA 01/13/16.  - Plavix stopped 12/18 due to recurrent GIB 10. H/o amio-induced hypothyroidism: Amio restarted due to recurrent atrial arrhythmias and VT.  - Continue home levothyroxine dose. No change.  11. VT:  - Underwent DC-CV 9/17. Repeat episode of slow VT 08/17/17 requiring DC-CV.  - Continue amio 200 daily.  - Goal K > 4.0 Mg >  2.0. Stable.   Length of Stay: 22  Glori Bickers, MD 12/10/2017, 9:00 AM  VAD Team --- VAD ISSUES ONLY--- Pager [phone] (7am - 7am)  Advanced Heart Failure Team  Pager [phone] (M-F; 7a - 4p)   Please contact [facility] for night-coverage after hours (4p -7a ) and weekends on [email or web address]

## 2017-12-10 NOTE — Progress Notes (Addendum)
Urology Inpatient Progress Report  Acute on chronic systolic (congestive) heart failure (HCC) [I50.23]  Procedure(s): CYSTOSCOPY WITH left RETROGRADE PYELOGRAM, LEFT URETEROSCOPY AND STENT PLACEMENT HOLMIUM LASER APPLICATION TRANSURETHRAL RESECTION OF BLADDER TUMOR (TURBT)  6 Days Post-Op   Intv/Subj: Patient continues to complain of fatigue, nausea, vomiting.  He does not feel well at all.  He is now Oliguric with renal failure and uremic symptoms.  Repeat renal ultrasound revealed interval development of bilateral mild hydronephrosis.  Hemoglobin is stable.  His bladder flushed clear after only irrigating 100 cc of normal saline into his bladder.  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 0.9 %  sodium chloride infusion  250 mL Intravenous PRN Bensimhon, Shaune Pascal, MD   Stopped at 12/04/17 1330  . 0.9 %  sodium chloride infusion   Intravenous Once Kyung Rudd, CRNA      . acetaminophen (TYLENOL) tablet 650 mg  650 mg Oral Q4H PRN Clegg, Amy D, NP   650 mg at 12/09/17 2350  . amiodarone (PACERONE) tablet 200 mg  200 mg Oral Daily Larey Dresser, MD   200 mg at 12/10/17 0916  . atorvastatin (LIPITOR) tablet 80 mg  80 mg Oral q1800 Larey Dresser, MD   80 mg at 12/09/17 2131  . docusate sodium (COLACE) capsule 200 mg  200 mg Oral Daily Larey Dresser, MD   200 mg at 12/10/17 0093  . guaiFENesin (ROBITUSSIN) 100 MG/5ML solution 100 mg  5 mL Oral Q4H PRN Bensimhon, Shaune Pascal, MD   100 mg at 12/08/17 2300  . heparin 1000 UNIT/ML injection           . lactated ringers infusion   Intravenous Continuous Effie Berkshire, MD 0 mL/hr at 12/04/17 1226    . levothyroxine (SYNTHROID, LEVOTHROID) tablet 150 mcg  150 mcg Oral QAC breakfast Einar Grad, RPH   150 mcg at 12/10/17 0757  . lidocaine (XYLOCAINE) 1 % (with pres) injection           . MEDLINE mouth rinse  15 mL Mouth Rinse BID Bensimhon, Shaune Pascal, MD      . ondansetron [facility]) injection 4 mg  4 mg Intravenous Q6H PRN Clegg, Amy D, NP   4 mg at 12/09/17 1513  . pantoprazole (PROTONIX) EC tablet 40 mg  40 mg Oral Daily Larey Dresser, MD   40 mg at 12/10/17 8182  . promethazine (PHENERGAN) injection 12.5 mg  12.5 mg Intravenous Q6H PRN Clegg, Amy D, NP   12.5 mg at 12/09/17 2350  . ranolazine (RANEXA) 12 hr tablet 500 mg  500 mg Oral BID Larey Dresser, MD   500 mg at 12/10/17 9937  . sildenafil (REVATIO) tablet 20 mg  20 mg Oral TID Jolaine Artist, MD   20 mg at 12/10/17 0916  . sodium chloride flush (NS) 0.9 % injection 10-40 mL  10-40 mL Intracatheter PRN Bensimhon, Shaune Pascal, MD   10 mL at 12/07/17 2121  . sodium chloride flush (NS) 0.9 % injection 3 mL  3 mL Intravenous Q12H Bensimhon, Shaune Pascal, MD   3 mL at 12/10/17 0931  . sodium chloride flush (NS) 0.9 % injection 3 mL  3 mL Intravenous PRN Bensimhon, Shaune Pascal,  MD      . tamsulosin (FLOMAX) capsule 0.4 mg  0.4 mg Oral Daily Larey Dresser, MD   0.4 mg at 12/10/17 0916     Objective: Vital: Vitals:   12/10/17 0501 12/10/17 0757 12/10/17 0838 12/10/17 1140  BP:  (!) 89/76  (!) 86/70  Pulse:  (!) 42 65 71  Resp:  15 15 16   Temp:  98 F (36.7 C)  (!) 97.4 F (36.3 C)  TempSrc:  Oral  Oral  SpO2:  (!) 89% 95% 97%  Weight: 88.9 kg (195 lb 15.8 oz)     Height:       I/Os: I/O last 3 completed shifts: In: 404.4 [P.O.:240; I.V.:164.4] Out: 1275 [Urine:1275]  Physical Exam:  General: Patient is in no apparent distress but appears that he does not feel well Lungs: Normal respiratory effort, chest expands symmetrically. GI: The abdomen is soft and nontender without mass. Foley: Dark red urine but flushed clear quickly with sterile water. Ext: lower extremities symmetric  Lab Results: Recent Labs     12/08/17 0505 12/09/17 0418 12/10/17 0430  WBC 8.1 7.6 8.7  HGB 8.5* 9.2* 8.9*  HCT 26.6* 28.7* 28.3*   Recent Labs    12/08/17 0505 12/09/17 0418 12/10/17 0430  NA 132* 134* 135  K 4.5 4.3 4.5  CL 100* 103 101  CO2 21* 19* 20*  GLUCOSE 123* 126* 126*  BUN 40* 49* 65*  CREATININE 3.92* 4.89* 6.25*  CALCIUM 8.2* 8.3* 8.3*   Recent Labs    12/08/17 0505 12/09/17 0418 12/10/17 0430  INR 1.90 2.11 2.35   No results for input(s): LABURIN in the last 72 hours. Results for orders placed or performed during the hospital encounter of 11/29/17  MRSA PCR Screening     Status: None   Collection Time: 11/29/17  5:47 PM  Result Value Ref Range Status   MRSA by PCR NEGATIVE NEGATIVE Final    Comment:        The GeneXpert MRSA Assay (FDA approved for NASAL specimens only), is one component of a comprehensive MRSA colonization surveillance program. It is not intended to diagnose MRSA infection nor to guide or monitor treatment for MRSA infections.   Culture, Urine     Status: Abnormal   Collection Time: 12/02/17  1:30 PM  Result Value Ref Range Status   Specimen Description URINE, CLEAN CATCH  Final   Special Requests NONE  Final   Culture <10,000 COLONIES/mL INSIGNIFICANT GROWTH (A)  Final   Report Status 12/03/2017 FINAL  Final   *Note: Due to a large number of results and/or encounters for the requested time period, some results have not been displayed. A complete set of results can be found in Results Review.    Studies/Results: US Renal  Result Date: 12/10/2017 CLINICAL DATA:  Chronic renal insufficiency, hypertension. EXAM: RENAL / URINARY TRACT ULTRASOUND COMPLETE COMPARISON:  Renal ultrasound of January 05, 2018 FINDINGS: Right Kidney: Length: 11.2 cm. There is diffuse cortical thinning. There is mild hydronephrosis. There is a exophytic upper pole cyst measuring 7.4 cm in greatest dimension. There is a smaller upper pole cortical cyst measuring 3.8 cm in greatest  dimension. Left Kidney: Length: 11.4 cm. There is mild cortical thinning. The cortical echotexture is similar to that on the right. There is mild hydronephrosis. There is a lateral midpole cortical cyst measuring 1.8 cm in diameter. There is a lower pole cyst measuring 3.8 cm in diameter. Bladder: The urinary bladder is decompressed with  a Foley catheter. IMPRESSION: Interval development of right-sided hydronephrosis. Stable left-sided hydronephrosis. Bilateral renal cortical cysts and cortical thinning. Electronically Signed   By: David  Martinique M.D.   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  Plan: The patient has continued to unfortunately  develop worsening renal function.  He is having a temporary dialysis catheter placement today and will undergo dialysis today.  Renal ultrasound does show interval development of mild bilateral hydronephrosis.  We will repeat a CT of the abdomen and pelvis to further investigate this.  Due to the recent resection, I would not be able to place a right ureteral stent retrograde.  Therefore, I will discuss with interventional radiology the possibility of a right nephrostomy tube.  This can potentially be converted over to a nephroureteral stent possibly if the nephrostomy tube is helpful.  Left nephrostomy tube is an option for the left side given the failure of the left ureteral stent after recent instrumentation and persistent left-sided hydronephrosis.  Suspect that he may have a blood clot obstructing the left side or potentially [place].  CT scan may help differentiate this.  Either way, a left-sided  nephrostomy tube may be the better option for optimal decompression in his current state.  Warfarin has been held by the cardiology team and his INR will likely need to bee at least <1.5 for safe placement of nephrostomy tubes.   addendum I had the opportunity to review the ultrasound images and discuss this case at length with Dr. Kathlene Cote. After comparing ultrasound to prior ones, it looks like the left hydronephrosis has resolved with minimal residual fullness. On the right there is very minimal hydronephrosis/fullness (official renal US read has been addended as well). These findings suggest against an obstructive cause of renal failure and it is felt that nephrostomy tubes would not be of benefit and risks would outweigh the minimal liklihood of benefit.  Feel his renal failure is related to a pre-renal/renal cause rather than obstruction. Will still get a CT w/o contrast abd/pelvis to confirm no surgically correctable cause of aki.   Link Snuffer, MD Urology 12/10/2017, 3:21 PM

## 2017-12-11 LAB — CBC WITH DIFFERENTIAL/PLATELET
BASOS ABS: 0 10*3/uL (ref 0.0–0.1)
BASOS PCT: 1 %
Eosinophils Absolute: 0.4 10*3/uL (ref 0.0–0.7)
Eosinophils Relative: 6 %
HEMATOCRIT: 30 % — AB (ref 39.0–52.0)
Hemoglobin: 9.5 g/dL — ABNORMAL LOW (ref 13.0–17.0)
Lymphocytes Relative: 9 %
Lymphs Abs: 0.6 10*3/uL — ABNORMAL LOW (ref 0.7–4.0)
MCH: 27.8 pg (ref 26.0–34.0)
MCHC: 31.7 g/dL (ref 30.0–36.0)
MCV: 87.7 fL (ref 78.0–100.0)
MONO ABS: 1.1 10*3/uL — AB (ref 0.1–1.0)
Monocytes Relative: 17 %
NEUTROS ABS: 4.4 10*3/uL (ref 1.7–7.7)
Neutrophils Relative %: 69 %
PLATELETS: 190 10*3/uL (ref 150–400)
RBC: 3.42 MIL/uL — ABNORMAL LOW (ref 4.22–5.81)
RDW: 17.7 % — AB (ref 11.5–15.5)
WBC: 6.4 10*3/uL (ref 4.0–10.5)

## 2017-12-11 LAB — HEPATITIS B SURFACE ANTIBODY,QUALITATIVE: Hep B S Ab: NONREACTIVE

## 2017-12-11 LAB — BASIC METABOLIC PANEL
ANION GAP: 13 (ref 5–15)
BUN: 35 mg/dL — AB (ref 6–20)
CO2: 24 mmol/L (ref 22–32)
Calcium: 8.1 mg/dL — ABNORMAL LOW (ref 8.9–10.3)
Chloride: 97 mmol/L — ABNORMAL LOW (ref 101–111)
Creatinine, Ser: 3.15 mg/dL — ABNORMAL HIGH (ref 0.61–1.24)
GFR calc Af Amer: 20 mL/min — ABNORMAL LOW (ref >60)
GFR, EST NON AFRICAN AMERICAN: 17 mL/min — AB (ref >60)
GLUCOSE: 145 mg/dL — AB (ref 65–99)
Potassium: 4.5 mmol/L (ref 3.5–5.1)
Sodium: 134 mmol/L — ABNORMAL LOW (ref 135–145)

## 2017-12-11 LAB — HEPATITIS B CORE ANTIBODY, TOTAL: HEP B C TOTAL AB: NEGATIVE

## 2017-12-11 LAB — COOXEMETRY PANEL
CARBOXYHEMOGLOBIN: 2.3 % — AB (ref 0.5–1.5)
Methemoglobin: 0.8 % (ref 0.0–1.5)
O2 Saturation: 75.2 %
Total hemoglobin: 9.5 g/dL — ABNORMAL LOW (ref 12.0–16.0)

## 2017-12-11 LAB — HEPATITIS B SURFACE ANTIGEN: Hepatitis B Surface Ag: NEGATIVE

## 2017-12-11 LAB — LACTATE DEHYDROGENASE: LDH: 288 U/L — ABNORMAL HIGH (ref 98–192)

## 2017-12-11 LAB — PROTIME-INR
INR: 1.85
PROTHROMBIN TIME: 21.2 s — AB (ref 11.4–15.2)

## 2017-12-11 NOTE — Progress Notes (Signed)
Advanced Heart Failure VAD Team Note  Subjective:    Started Rocephin 12/03/17   CT Renal Stone. Stable L ureteral stone non obstructing. Increasing filling defect within the urinary bladder suspicious for an underlying mass. Although this may simply represent a hematoma,urologic consultation and direct visualization is recommended.  Cystocopy 12/04/17  - s/p Left ureteroscopy and stent placement.  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 Had lower abdominal pain so bladder irrigation started. Unfortunately he had a blockage so irrigation was stopped.  -2/5 temp HD cath placed. Started ihd.   Yesterday milrinone stopped.   Feeling better today. Denies SOB. Denies n/v   Hgb 9.5   LDH 288  LVAD INTERROGATION:  HeartMate II LVAD:  Flow 3.9  liters/min, speed 8800 power 5  PI 7.3  Objective:    Vital Signs:   Temp:  [97 F (36.1 C)-98.9 F (37.2 C)] 98.9 F (37.2 C) (02/06 0750) Pulse Rate:  [27-71] 60 (02/06 0750) Resp:  [12-19] 12 (02/06 0750) BP: (83-128)/(62-87) 96/79 (02/06 0750) SpO2:  [94 %-98 %] 97 % (02/06 0750) Weight:  [195 lb 1.7 oz (88.5 kg)-202 lb 6.1 oz (91.8 kg)] 195 lb 1.7 oz (88.5 kg) (02/06 0516) Last BM Date: 12/11/17 Mean arterial Pressure Maps 70-80s  Intake/Output:   Intake/Output Summary (Last 24 hours) at 12/11/2017 0927 Last data filed at 12/11/2017 0353 Gross per 24 hour  Intake 687.71 ml  Output 1850 ml  Net -1162.29 ml     Physical Exam    Physical Exam: GENERAL: Elderly. NAD.  HEENT: normal  NECK: Supple, JVP 6-7  .  2+ bilaterally, no bruits.  No lymphadenopathy or thyromegaly appreciated.  RIJ HD catheter.  CARDIAC:  Mechanical heart sounds with LVAD hum present.  LUNGS:  Clear to auscultation bilaterally.  ABDOMEN:  Soft, round, nontender, positive bowel sounds x4.     LVAD exit site: well-healed and incorporated.  Dressing dry and intact.  No erythema or drainage.   Stabilization device present and accurately applied.  Driveline dressing is being changed daily per sterile technique. EXTREMITIES:  Warm and dry, no cyanosis, clubbing, rash or edema  NEUROLOGIC:  Alert and oriented x 4.  Gait steady.  No aphasia.  No dysarthria.  Affect pleasant.    GU: foley with tea colored urine.     Telemetry   NSR 60s with PVCs.  Personally reviewed.   EKG    No new tracings.    Labs   Basic Metabolic Panel: Recent Labs  Lab 12/05/17 0241  12/06/17 0327 12/07/17 0212 12/08/17 0505 12/09/17 0418 12/10/17 0430  NA  --    < > 134* 132* 132* 134* 135  K  --    < > 4.5 4.6 4.5 4.3 4.5  CL  --    < > 103 102 100* 103 101  CO2  --    < > 19* 21* 21* 19* 20*  GLUCOSE  --    < > 124* 131* 123* 126* 126*  BUN  --    < > 27* 27* 40* 49* 65*  CREATININE  --    < > 2.46* 2.47* 3.92* 4.89* 6.25*  CALCIUM  --    < > 8.8* 8.4* 8.2* 8.3* 8.3*  MG 2.0  --   --   --   --   --   --    < > = values in this interval not displayed.    Liver Function  Tests: No results for input(s): AST, ALT, ALKPHOS, BILITOT, PROT, ALBUMIN in the last 168 hours. No results for input(s): LIPASE, AMYLASE in the last 168 hours. No results for input(s): AMMONIA in the last 168 hours.  CBC: Recent Labs  Lab 12/07/17 0212 12/08/17 0505 12/09/17 0418 12/10/17 0430 12/11/17 0355  WBC 9.1 8.1 7.6 8.7 6.4  NEUTROABS 6.7 5.9 5.4 6.2 4.4  HGB 9.0* 8.5* 9.2* 8.9* 9.5*  HCT 28.5* 26.6* 28.7* 28.3* 30.0*  MCV 89.6 89.0 88.3 88.2 87.7  PLT 158 146* 163 182 190    INR: Recent Labs  Lab 12/07/17 0212 12/08/17 0505 12/09/17 0418 12/10/17 0430 12/11/17 0355  INR 1.52 1.90 2.11 2.35 1.85    Other results:  EKG:    Imaging   US Renal  Addendum Date: 12/10/2017   ADDENDUM REPORT: 12/10/2017 16:48 ADDENDUM: Renal ultrasound images were reviewed and discussed with Dr. Gloriann Loan. On the right side, current ultrasound shows mild prominence of the collecting system consistent with  minimal/mild hydronephrosis. On the left side, current ultrasound shows significant improvement in hydronephrosis after ureteral stent removal with no significant left hydronephrosis identified. There is some mild dilatation of the left renal collecting system. Electronically Signed   By: Aletta Edouard M.D.   On: 12/10/2017 16:48   Result Date: 12/10/2017 CLINICAL DATA:  Chronic renal insufficiency, hypertension. EXAM: RENAL / URINARY TRACT ULTRASOUND COMPLETE COMPARISON:  Renal ultrasound of January 05, 2018 FINDINGS: Right Kidney: Length: 11.2 cm. There is diffuse cortical thinning. There is mild hydronephrosis. There is a exophytic upper pole cyst measuring 7.4 cm in greatest dimension. There is a smaller upper pole cortical cyst measuring 3.8 cm in greatest dimension. Left Kidney: Length: 11.4 cm. There is mild cortical thinning. The cortical echotexture is similar to that on the right. There is mild hydronephrosis. There is a lateral midpole cortical cyst measuring 1.8 cm in diameter. There is a lower pole cyst measuring 3.8 cm in diameter. Bladder: The urinary bladder is decompressed with a Foley catheter. IMPRESSION: Interval development of right-sided hydronephrosis. Stable left-sided hydronephrosis. Bilateral renal cortical cysts and cortical thinning. Electronically Signed: By: David  Martinique M.D. On: 12/10/2017 07:11   Ir Fluoro Guide Cv Line Right  Result Date: 12/10/2017 CLINICAL DATA:  Progressive renal failure requiring hemodialysis. Request has been made to place a temporary non tunneled dialysis catheter. EXAM: NON-TUNNELED CENTRAL VENOUS CATHETER PLACEMENT WITH ULTRASOUND AND FLUOROSCOPIC GUIDANCE FLUOROSCOPY TIME:  18 seconds.  9.2 mGy. PROCEDURE: The procedure, risks, benefits, and alternatives were explained to the patient. Questions regarding the procedure were encouraged and answered. The patient understands and consents to the procedure. A time-out was performed prior to initiating the  procedure. Ultrasound was used to confirm patency of the right internal jugular vein. The right neck and chest were prepped with chlorhexidine in a sterile fashion, and a sterile drape was applied covering the operative field. Maximum barrier sterile technique with sterile gowns and gloves were used for the procedure. Local anesthesia was provided with 1% lidocaine. After creating a small venotomy incision, a 21 gauge needle was advanced into the right internal jugular vein under direct, real-time ultrasound guidance. Ultrasound image documentation was performed. Venous access was dilated over a guidewire. A 20 cm, triple lumen 12 Fr Mahurkar non tunneled hemodialysis catheter was placed over a guidewire. Final catheter positioning was confirmed and documented with a fluoroscopic spot image. The catheter was aspirated, flushed with saline, and injected with appropriate volume heparin dwells. The catheter exit site was  secured with a 0-Prolene retention suture. COMPLICATIONS: None.  No pneumothorax. FINDINGS: After catheter placement, the tip lies at the cavoatrial junction. The catheter aspirates normally and is ready for immediate use. IMPRESSION: Placement of non-tunneled central venous hemodialysis catheter via the right internal jugular vein. The catheter tip lies at the cavoatrial junction. The catheter is ready for immediate use. Electronically Signed   By: Aletta Edouard M.D.   On: 12/10/2017 16:41   Ir US Guide Vasc Access Right  Result Date: 12/10/2017 CLINICAL DATA:  Progressive renal failure requiring hemodialysis. Request has been made to place a temporary non tunneled dialysis catheter. EXAM: NON-TUNNELED CENTRAL VENOUS CATHETER PLACEMENT WITH ULTRASOUND AND FLUOROSCOPIC GUIDANCE FLUOROSCOPY TIME:  18 seconds.  9.2 mGy. PROCEDURE: The procedure, risks, benefits, and alternatives were explained to the patient. Questions regarding the procedure were encouraged and answered. The patient understands and  consents to the procedure. A time-out was performed prior to initiating the procedure. Ultrasound was used to confirm patency of the right internal jugular vein. The right neck and chest were prepped with chlorhexidine in a sterile fashion, and a sterile drape was applied covering the operative field. Maximum barrier sterile technique with sterile gowns and gloves were used for the procedure. Local anesthesia was provided with 1% lidocaine. After creating a small venotomy incision, a 21 gauge needle was advanced into the right internal jugular vein under direct, real-time ultrasound guidance. Ultrasound image documentation was performed. Venous access was dilated over a guidewire. A 20 cm, triple lumen 12 Fr Mahurkar non tunneled hemodialysis catheter was placed over a guidewire. Final catheter positioning was confirmed and documented with a fluoroscopic spot image. The catheter was aspirated, flushed with saline, and injected with appropriate volume heparin dwells. The catheter exit site was secured with a 0-Prolene retention suture. COMPLICATIONS: None.  No pneumothorax. FINDINGS: After catheter placement, the tip lies at the cavoatrial junction. The catheter aspirates normally and is ready for immediate use. IMPRESSION: Placement of non-tunneled central venous hemodialysis catheter via the right internal jugular vein. The catheter tip lies at the cavoatrial junction. The catheter is ready for immediate use. Electronically Signed   By: Aletta Edouard M.D.   On: 12/10/2017 16:41   Ct Renal Stone Study  Result Date: 12/11/2017 CLINICAL DATA:  Hydronephrosis history of trans urethral resection of bladder tumor EXAM: CT ABDOMEN AND PELVIS WITHOUT CONTRAST TECHNIQUE: Multidetector CT imaging of the abdomen and pelvis was performed following the standard protocol without IV contrast. COMPARISON:  Ultrasound 12/10/2017, 12/08/2017, 12/06/2017 CT 12/03/2017, 10/18/2017 FINDINGS: Lower chest: Emphysema at the bilateral  lung bases. Previously demonstrated left lower lobe pulmonary nodule not well appreciated on the current exam. Trace bilateral pleural effusion. Cardiomegaly with left ventricular assist device and cardiac pacing leads again visible. Artifact from the LVAD limits the study. Hepatobiliary: No focal liver abnormality is seen. Status post cholecystectomy. No biliary dilatation. Pancreas: Unremarkable. No pancreatic ductal dilatation or surrounding inflammatory changes. Spleen: Normal in size without focal abnormality. Adrenals/Urinary Tract: Adrenal glands are within normal limits. Bilateral renal cysts are again demonstrated, on the left with peripheral calcification. Mild left hydronephrosis and hydroureter. The previously noted ureteral stone on the comparison CT is no longer evident. Slight dilatation of right extrarenal pelvis without calyceal dilatation. Slight prominence of the right ureter, more so distally without stone identified. Thick-walled decompressed urinary bladder. Stomach/Bowel: Stomach is nonenlarged. No dilated small bowel. No colon wall thickening. Appendix not well seen. Sigmoid colon diverticula without acute inflammation. Vascular/Lymphatic: No significantly enlarged  lymph nodes. 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 Probable renal artery grafts. Changes suggesting prior aortofemoral bypass. Moderate aortic atherosclerosis. Reproductive: Negative Other: Edema and small amount fluid in the presacral region. Negative for free air. Musculoskeletal: Postsurgical changes with stabilization rod and fixating screws L3 and L5 with marked compression fracture of L4. IMPRESSION: 1. Mild left hydronephrosis and mild to moderate hydroureter. The previously noted ureteral stone is no longer visible. Minimal dilatation of right renal collecting system without significant caliceal dilatation/hydronephrosis. Prominence of the distal right ureter, no stone disease visualized. Foley catheter in the  bladder which is thick-walled and decompressed. 2. Edema and fluid within the presacral region, new since prior study. 3. Small bilateral pleural effusions. Moderate emphysema at the bases 4. 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 Electronically Signed   By: Donavan Foil M.D.   On: 12/11/2017 00:18     Medications:     Scheduled Medications: . amiodarone  200 mg Oral Daily  . atorvastatin  80 mg Oral q1800  . docusate sodium  200 mg Oral Daily  . levothyroxine  150 mcg Oral QAC breakfast  . mouth rinse  15 mL Mouth Rinse BID  . pantoprazole  40 mg Oral Daily  . ranolazine  500 mg Oral BID  . sildenafil  20 mg Oral TID  . sodium chloride flush  3 mL Intravenous Q12H  . tamsulosin  0.4 mg Oral Daily    Infusions: . sodium chloride Stopped (12/04/17 1330)  . sodium chloride    . sodium chloride    . sodium chloride    . lactated ringers 0 mL/hr at 12/04/17 1226    PRN Medications: sodium chloride, sodium chloride, sodium chloride, acetaminophen, alteplase, guaiFENesin, heparin, heparin, lidocaine (PF), lidocaine (PF), lidocaine-prilocaine, ondansetron (ZOFRAN) IV, pentafluoroprop-tetrafluoroeth, promethazine, sodium chloride flush, sodium chloride flush   VAD EVENTS   GU Event 09/01/14 had impacted ureteral stone and underwent cystoscopy and flexible ureteroscopy with lithotripsy and basket extraction.   09/2015 Hematuria and chronic nephrolithiasis. Renal US showed mild hyronephrosis and bilateral renal cysts. Dr. Risa Grill took back for repeat lithotripsy and stone extraction by ureteroscopy and J stent placement. 12/18 Recurrent hematuria. 32mm left ureteral stone. 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  4/2-18 - Episode of melena. Hgb 10.9-> 8.7. EGD/colonoscopy negative 12/18 - Melena with syncope  Hgb 6.5. Transfused 5u. 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    01/2016 R CEA. He was started on Plavix.  01/2016 Lumbar fusion L3-L5 with pedicle screws posterolateral arthrodesis with BMP, allograft  Patient Profile  [name] is a 77 y.o. 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 Required short term milrinone.   Admitted 1/25 for elective ureteroscopy due to R ureteral stone.  Still with occasional hematuria. Continues with fatigue. RHC 1/25 showed low output due to RV failure (see below). Milrinone initiated at 0.125.  Assessment/Plan:    1. 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 Struggling with progressive NYHA III symptoms.  RHC 1/25 showed primarily RHF with low output.  - No real symptomatic improvement with milrinone. Milrinone stopped 2/5  - Volume status stable.  -  Continue Sildenafil 20 mg TID with RV failure.  - Continue VAD speed at 8800. VAD interrogated personally. Parameters stable. 2. Recurrent renal stone with ongoing hematuria and UTI: Left ureteral stone in 12/18. - Last dose of coumadin 1-24/2019 - 12/03/2017. CT with L stone and possible mass - Cystoscopy + Left Ureteroscopy with stent placed 12/04/17.  - Started rocephin 12/03/17. Ucx negative.  - Still with gross hematuria.  - Stent removed by Dr Jeffie Pollock 2/3  - U/s 2/5 with bilateral hydronephrosis -   CT on 2/5 with mild left hydronephrosis. Minimal dilitation on r without hydronephrosis.  3. AKI - Creatinine peaked 6.3.--ihd completed 2/5. Today creatinine down to 3.1. Nephrology appreciated.  - Suspect combination of ATN versus obstruction. Nephrology & Urology following 4. Bladder Tumor - Seen on CT 1/29 and confirmed/resected on cystoscopy 12/04/17. - Pathology c/w high-grade urothelial CA. Discussed with Dr. Gloriann Loan. Plan outpatient BCG treatment. -  As above. Needs further w/u for obstruction  5. LVAD:  - VAD interrogated personally. Parameters stable.  - LDH stable.  -off heparin. INR 1.8. Restart coumadin if no procedure.   6. H/o GIB: S/p clipping of 2 jejunal AVMs in 4/16. Continue coumadin and plavix (with h/o CVA). Admitted 4/18 with recurrent GIB. Transfused 2u. EGD/colon negative. Admitted 12/18, Hgb 6.5, Transfused 5u. EGD with 2 AVMs treated with APC - hgb stable 9.5 7.  Atrial arrhythmias: Paroxysmal atrial fibrillation, atrial tachycardia.  S/p DC-CV 06/04/14, DC-CV 03/02/15 and 01/19/16.  - remains in NSR.  - Continue amiodarone.  8. HTN:  Stable.  9. 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 No further.  - Continue atorvastatin. (also warfarin INR goal 2-2.5). S/p R CEA 01/13/16.  - Plavix stopped 12/18 due to recurrent GIB 10. H/o amio-induced hypothyroidism: Amio restarted due to recurrent atrial arrhythmias and VT.  - Continue home levothyroxine dose. No change.  11. VT:  - Underwent DC-CV 9/17. Repeat episode of slow VT 08/17/17 requiring DC-CV.  - Continue amio 200 daily.  - Goal K > 4.0 Mg > 2.0. Stable.   OOB today.   Length of Stay: [place], NP 12/11/2017, 9:27 AM  VAD Team --- VAD ISSUES ONLY--- Pager [phone] (7am - 7am)  Advanced Heart Failure Team  Pager [phone] (M-F; 7a - 4p)   Please contact [facility] for night-coverage after hours (4p -7a ) and weekends on [email or web address]   Patient seen and examined with the above-signed Advanced Practice Provider and/or Housestaff. I personally reviewed laboratory data, imaging studies and relevant notes. I independently examined the patient and formulated the important aspects of the plan. I have edited the note to reflect any of my changes or salient points. I have personally discussed the plan with the patient and/or family.  Suspect AKI due to combination of ATN and obstructive uropathy. Underwent placement of trialysis catheter yesterday with initiation of  iHD. Tolerated well. Weight down 5 pounds. Feeling better today. Appetite improved. Still with gross hematuria. I spoke with Dr. Gloriann Loan who feels hydronephrosis improving. No need for perc tubes at this time. Will repeat u/s on Friday to follow. Remains on abx for UTI. Will need watchful waiting to see if renal function recovers.    Volume status improved. Rhythm stable. Co-ox looks good. VAD interrogated personally. Parameters stable. INR 1.85. Will resume coumadin.  Total time spent 45 minutes. Over half that time spent discussing above.   Glori Bickers, MD  6:58 PM

## 2017-12-11 NOTE — Progress Notes (Addendum)
Urology Inpatient Progress Report  Acute on chronic systolic (congestive) heart failure (HCC) [I50.23]  Procedure(s): CYSTOSCOPY WITH BILATERAL RETROGRADE PYELOGRAM, LEFT URETEROSCOPY AND STENT PLACEMENT HOLMIUM LASER APPLICATION TRANSURETHRAL RESECTION OF BLADDER TUMOR (TURBT)  7 Days Post-Op   Intv/Subj: Underwent hemodialysis yesterday.  Subjectively, the patient feels much improved and uremic symptoms have nearly resolved.  He is in much better spirits today.  His urine output has improved a small amount.  He underwent a CT scan of the abdomen and pelvis which revealed no right-sided hydronephrosis.  There was some mild hydronephrosis on the left.  There was mild hydroureter down to the level of the bladder which was thick-walled.  There were no ureteral calculi.  I flushed his catheter today and there was no return of blood.  Return was clear.  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 0.9 %  sodium chloride infusion  250 mL Intravenous PRN Bensimhon, Shaune Pascal, MD   Stopped at 12/04/17 1330  . 0.9 %  sodium chloride infusion   Intravenous Once Kyung Rudd, CRNA      . 0.9 %  sodium chloride infusion  100 mL Intravenous PRN Elmarie Shiley, MD      . 0.9 %  sodium chloride infusion  100 mL Intravenous PRN Elmarie Shiley, MD      . acetaminophen (TYLENOL) tablet 650 mg  650 mg Oral Q4H PRN Clegg, Amy D, NP   650 mg at 12/09/17 2350  . alteplase (CATHFLO ACTIVASE) injection 2 mg  2 mg Intracatheter Once PRN Elmarie Shiley, MD      . amiodarone (PACERONE) tablet 200 mg  200 mg Oral Daily Larey Dresser, MD   200 mg at 12/11/17 1962  . atorvastatin (LIPITOR) tablet 80 mg  80 mg Oral q1800 Larey Dresser, MD   80 mg at 12/10/17 1700  . docusate  sodium (COLACE) capsule 200 mg  200 mg Oral Daily Larey Dresser, MD   200 mg at 12/11/17 2297  . guaiFENesin (ROBITUSSIN) 100 MG/5ML solution 100 mg  5 mL Oral Q4H PRN Bensimhon, Shaune Pascal, MD   100 mg at 12/08/17 2300  . heparin injection 1,000 Units  1,000 Units Dialysis PRN Elmarie Shiley, MD      . heparin injection   Intravenous PRN Aletta Edouard, MD   2,800 Units at 12/10/17 1546  . lactated ringers infusion   Intravenous Continuous Effie Berkshire, MD 0 mL/hr at 12/04/17 1226    . levothyroxine (SYNTHROID, LEVOTHROID) tablet 150 mcg  150 mcg Oral QAC breakfast Einar Grad, RPH   150 mcg at 12/11/17 0801  . lidocaine (PF) (XYLOCAINE) 1 % injection 5 mL  5 mL Intradermal PRN Elmarie Shiley, MD      . lidocaine (PF) (XYLOCAINE) 1 % injection    PRN Aletta Edouard, MD   2 mL at 12/10/17 1545  . lidocaine-prilocaine (EMLA) cream 1 application  1 application Topical PRN Elmarie Shiley, MD      . MEDLINE mouth rinse  15 mL Mouth Rinse BID Bensimhon, Shaune Pascal, MD      . ondansetron [facility]) injection 4 mg  4 mg Intravenous Q6H PRN Clegg, Amy D, NP   4 mg at 12/09/17 1513  . pantoprazole (PROTONIX)  EC tablet 40 mg  40 mg Oral Daily Larey Dresser, MD   40 mg at 12/11/17 1607  . pentafluoroprop-tetrafluoroeth (GEBAUERS) aerosol 1 application  1 application Topical PRN Elmarie Shiley, MD      . promethazine (PHENERGAN) injection 12.5 mg  12.5 mg Intravenous Q6H PRN Clegg, Amy D, NP   12.5 mg at 12/09/17 2350  . ranolazine (RANEXA) 12 hr tablet 500 mg  500 mg Oral BID Larey Dresser, MD   500 mg at 12/11/17 3710  . sildenafil (REVATIO) tablet 20 mg  20 mg Oral TID Bensimhon, Shaune Pascal, MD   20 mg at 12/11/17 6269  . sodium chloride flush (NS) 0.9 % injection 10-40 mL  10-40 mL Intracatheter PRN Bensimhon, Shaune Pascal, MD   10 mL at 12/07/17 2121  . sodium chloride flush (NS) 0.9 % injection 3 mL  3 mL Intravenous Q12H Bensimhon, Shaune Pascal, MD   3 mL at 12/11/17 0924  . sodium chloride flush (NS) 0.9 %  injection 3 mL  3 mL Intravenous PRN Bensimhon, Shaune Pascal, MD      . tamsulosin (FLOMAX) capsule 0.4 mg  0.4 mg Oral Daily Larey Dresser, MD   0.4 mg at 12/11/17 4854     Objective: Vital: Vitals:   12/10/17 2346 12/11/17 0353 12/11/17 0516 12/11/17 0750  BP: 90/74 (!) 88/70  96/79  Pulse: 70 67  60  Resp: 19 14  12   Temp: 98 F (36.7 C) (!) 97 F (36.1 C)  98.9 F (37.2 C)  TempSrc: Oral Axillary  Oral  SpO2: 97% 96%  97%  Weight:   88.5 kg (195 lb 1.7 oz)   Height:       I/Os: I/O last 3 completed shifts: In: 1148.5 [P.O.:1030; I.V.:118.5] Out: 2250 [Urine:1750; Other:500]  Physical Exam:  General: Patient is in no apparent distress Lungs: Normal respiratory effort, chest expands symmetrically. GI: The abdomen is soft and nontender without mass. Foley: After irrigation, there was no return of any blood.  The return was completely clear. Ext: lower extremities symmetric  Lab Results: Recent Labs    12/09/17 0418 12/10/17 0430 12/11/17 0355  WBC 7.6 8.7 6.4  HGB 9.2* 8.9* 9.5*  HCT 28.7* 28.3* 30.0*   Recent Labs    12/09/17 0418 12/10/17 0430 12/11/17 0827  NA 134* 135 134*  K 4.3 4.5 4.5  CL 103 101 97*  CO2 19* 20* 24  GLUCOSE 126* 126* 145*  BUN 49* 65* 35*  CREATININE 4.89* 6.25* 3.15*  CALCIUM 8.3* 8.3* 8.1*   Recent Labs    12/09/17 0418 12/10/17 0430 12/11/17 0355  INR 2.11 2.35 1.85   No results for input(s): LABURIN in the last 72 hours. Results for orders placed or performed during the hospital encounter of 11/29/17  MRSA PCR Screening     Status: None   Collection Time: 11/29/17  5:47 PM  Result Value Ref Range Status   MRSA by PCR NEGATIVE NEGATIVE Final    Comment:        The GeneXpert MRSA Assay (FDA approved for NASAL specimens only), is one component of a comprehensive MRSA colonization surveillance program. It is not intended to diagnose MRSA infection nor to guide or monitor treatment for MRSA infections.   Culture,  Urine     Status: Abnormal   Collection Time: 12/02/17  1:30 PM  Result Value Ref Range Status   Specimen Description URINE, CLEAN CATCH  Final   Special Requests NONE  Final   Culture <10,000 COLONIES/mL INSIGNIFICANT GROWTH (A)  Final   Report Status 12/03/2017 FINAL  Final   *Note: Due to a large number of results and/or encounters for the requested time period, some results have not been displayed. A complete set of results can be found in Results Review.    Studies/Results: US Renal  Addendum Date: 12/10/2017   ADDENDUM REPORT: 12/10/2017 16:48 ADDENDUM: Renal ultrasound images were reviewed and discussed with Dr. Gloriann Loan. On the right side, current ultrasound shows mild prominence of the collecting system consistent with minimal/mild hydronephrosis. On the left side, current ultrasound shows significant improvement in hydronephrosis after ureteral stent removal with no significant left hydronephrosis identified. There is some mild dilatation of the left renal collecting system. Electronically Signed   By: Aletta Edouard M.D.   On: 12/10/2017 16:48   Result Date: 12/10/2017 CLINICAL DATA:  Chronic renal insufficiency, hypertension. EXAM: RENAL / URINARY TRACT ULTRASOUND COMPLETE COMPARISON:  Renal ultrasound of January 05, 2018 FINDINGS: Right Kidney: Length: 11.2 cm. There is diffuse cortical thinning. There is mild hydronephrosis. There is a exophytic upper pole cyst measuring 7.4 cm in greatest dimension. There is a smaller upper pole cortical cyst measuring 3.8 cm in greatest dimension. Left Kidney: Length: 11.4 cm. There is mild cortical thinning. The cortical echotexture is similar to that on the right. There is mild hydronephrosis. There is a lateral midpole cortical cyst measuring 1.8 cm in diameter. There is a lower pole cyst measuring 3.8 cm in diameter. Bladder: The urinary bladder is decompressed with a Foley catheter. IMPRESSION: Interval development of right-sided hydronephrosis. Stable  left-sided hydronephrosis. Bilateral renal cortical cysts and cortical thinning. Electronically Signed: By: David  Martinique M.D. On: 12/10/2017 07:11   Ir Fluoro Guide Cv Line Right  Result Date: 12/10/2017 CLINICAL DATA:  Progressive renal failure requiring hemodialysis. Request has been made to place a temporary non tunneled dialysis catheter. EXAM: NON-TUNNELED CENTRAL VENOUS CATHETER PLACEMENT WITH ULTRASOUND AND FLUOROSCOPIC GUIDANCE FLUOROSCOPY TIME:  18 seconds.  9.2 mGy. PROCEDURE: The procedure, risks, benefits, and alternatives were explained to the patient. Questions regarding the procedure were encouraged and answered. The patient understands and consents to the procedure. A time-out was performed prior to initiating the procedure. Ultrasound was used to confirm patency of the right internal jugular vein. The right neck and chest were prepped with chlorhexidine in a sterile fashion, and a sterile drape was applied covering the operative field. Maximum barrier sterile technique with sterile gowns and gloves were used for the procedure. Local anesthesia was provided with 1% lidocaine. After creating a small venotomy incision, a 21 gauge needle was advanced into the right internal jugular vein under direct, real-time ultrasound guidance. Ultrasound image documentation was performed. Venous access was dilated over a guidewire. A 20 cm, triple lumen 12 Fr [place]kar non tunneled hemodialysis catheter was placed over a guidewire. Final catheter positioning was confirmed and documented with a fluoroscopic spot image. The catheter was aspirated, flushed with saline, and injected with appropriate volume heparin dwells. The catheter exit site was secured with a 0-Prolene retention suture. COMPLICATIONS: None.  No pneumothorax. FINDINGS: After catheter placement, the tip lies at the cavoatrial junction. The catheter aspirates normally and is ready for immediate use. IMPRESSION: Placement of non-tunneled central  venous hemodialysis catheter via the right internal jugular vein. The catheter tip lies at the cavoatrial junction. The catheter is ready for immediate use. Electronically Signed   By: Aletta Edouard M.D.   On: 12/10/2017 16:41  Ir US Guide Vasc Access Right  Result Date: 12/10/2017 CLINICAL DATA:  Progressive renal failure requiring hemodialysis. Request has been made to place a temporary non tunneled dialysis catheter. EXAM: NON-TUNNELED CENTRAL VENOUS CATHETER PLACEMENT WITH ULTRASOUND AND FLUOROSCOPIC GUIDANCE FLUOROSCOPY TIME:  18 seconds.  9.2 mGy. PROCEDURE: The procedure, risks, benefits, and alternatives were explained to the patient. Questions regarding the procedure were encouraged and answered. The patient understands and consents to the procedure. A time-out was performed prior to initiating the procedure. Ultrasound was used to confirm patency of the right internal jugular vein. The right neck and chest were prepped with chlorhexidine in a sterile fashion, and a sterile drape was applied covering the operative field. Maximum barrier sterile technique with sterile gowns and gloves were used for the procedure. Local anesthesia was provided with 1% lidocaine. After creating a small venotomy incision, a 21 gauge needle was advanced into the right internal jugular vein under direct, real-time ultrasound guidance. Ultrasound image documentation was performed. Venous access was dilated over a guidewire. A 20 cm, triple lumen 12 Fr Mahurkar non tunneled hemodialysis catheter was placed over a guidewire. Final catheter positioning was confirmed and documented with a fluoroscopic spot image. The catheter was aspirated, flushed with saline, and injected with appropriate volume heparin dwells. The catheter exit site was secured with a 0-Prolene retention suture. COMPLICATIONS: None.  No pneumothorax. FINDINGS: After catheter placement, the tip lies at the cavoatrial junction. The catheter aspirates normally  and is ready for immediate use. IMPRESSION: Placement of non-tunneled central venous hemodialysis catheter via the right internal jugular vein. The catheter tip lies at the cavoatrial junction. The catheter is ready for immediate use. Electronically Signed   By: Aletta Edouard M.D.   On: 12/10/2017 16:41   Ct Renal Stone Study  Result Date: 12/11/2017 CLINICAL DATA:  Hydronephrosis history of trans urethral resection of bladder tumor EXAM: CT ABDOMEN AND PELVIS WITHOUT CONTRAST TECHNIQUE: Multidetector CT imaging of the abdomen and pelvis was performed following the standard protocol without IV contrast. COMPARISON:  Ultrasound 12/10/2017, 12/08/2017, 12/06/2017 CT 12/03/2017, 10/18/2017 FINDINGS: Lower chest: Emphysema at the bilateral lung bases. Previously demonstrated left lower lobe pulmonary nodule not well appreciated on the current exam. Trace bilateral pleural effusion. Cardiomegaly with left ventricular assist device and cardiac pacing leads again visible. Artifact from the LVAD limits the study. Hepatobiliary: No focal liver abnormality is seen. Status post cholecystectomy. No biliary dilatation. Pancreas: Unremarkable. No pancreatic ductal dilatation or surrounding inflammatory changes. Spleen: Normal in size without focal abnormality. Adrenals/Urinary Tract: Adrenal glands are within normal limits. Bilateral renal cysts are again demonstrated, on the left with peripheral calcification. Mild left hydronephrosis and hydroureter. The previously noted ureteral stone on the comparison CT is no longer evident. Slight dilatation of right extrarenal pelvis without calyceal dilatation. Slight prominence of the right ureter, more so distally without stone identified. Thick-walled decompressed urinary bladder. Stomach/Bowel: Stomach is nonenlarged. No dilated small bowel. No colon wall thickening. Appendix not well seen. Sigmoid colon diverticula without acute inflammation. Vascular/Lymphatic: No significantly  enlarged lymph nodes. 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 Probable renal artery grafts. Changes suggesting prior aortofemoral bypass. Moderate aortic atherosclerosis. Reproductive: Negative Other: Edema and small amount fluid in the presacral region. Negative for free air. Musculoskeletal: Postsurgical changes with stabilization rod and fixating screws L3 and L5 with marked compression fracture of L4. IMPRESSION: 1. Mild left hydronephrosis and mild to moderate hydroureter. The previously noted ureteral stone is no longer visible. Minimal dilatation  of right renal collecting system without significant caliceal dilatation/hydronephrosis. Prominence of the distal right ureter, no stone disease visualized. Foley catheter in the bladder which is thick-walled and decompressed. 2. Edema and fluid within the presacral region, new since prior study. 3. Small bilateral pleural effusions. Moderate emphysema at the bases 4. 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 Electronically Signed   By: Donavan Foil M.D.   On: 12/11/2017 00:18    Assessment: 1.  Gross hematuria--improved, hemoglobin stable, non-bloody output from the catheter currently 2. High Grade T1 UCC of bladder: consider BCG once resolution of acute issues and out of hospital 3.  Acute renal insufficiency with persistent mild left sided hydronephrosis: I discussed the case in detail and reviewed all imaging with Dr. Kathlene Cote and also discussed the case with Dr. Posey Pronto.  Based on review of prior renal ultrasounds, the left-sided hydronephrosis has improved.  There is no evidence of any calculi or foreign material (such as an obvious clot) that could be causing obstruction.  Hydronephrosis may be secondary to some periureteral edema secondary to recent instrumentation.  It does appear to be improved from before based on ultrasounds.  It is felt that the renal insufficiency is from a combination of ATN as a result of hypotension during the surgery as  well as a possible small component of some obstruction.  His urine output has improved a small amount.  I discussed the possibility of ureteral stent with the patient as well as all physicians involved in his care.  The stent failed before and he developed hydronephrosis with a stent in place.  He would also have to undergo general anesthesia for the stent placement.   we also discussed nephrostomy tube placement on the left.  This is a more invasive route for relief of any possible obstruction and carries increased risk in this patient.  In addition, imaging appears to be showing improving hydronephrosis with no evidence of any clear cause. There also is no right sided hydronephrosis. I think there is a relatively high likelihood that decompression may not help his current situation compared to observation.  After discussion with the patient as well as all members of the team, we have collectively decided to hold off on intervention and instead follow him clinically and possibly repeat renal ultrasound in several days.  If there is progression of hydronephrosis, I would recommend nephrostomy tube placement. Currently, since no intervention is currently planned, OK from urological standpoint to resume warfarin.  Procedure(s): CYSTOSCOPY WITH left RETROGRADE PYELOGRAM, LEFT URETEROSCOPY AND STENT PLACEMENT HOLMIUM LASER APPLICATION TRANSURETHRAL RESECTION OF BLADDER TUMOR (TURBT), 7 Days Post-Op  doing well.   Link Snuffer, MD Urology 12/11/2017, 11:45 AM

## 2017-12-11 NOTE — Progress Notes (Signed)
LVAD Coordinator Rounding Note:  Admitted 11/29/17 due for elective ureteroscopy due to right ureteral stone. RHC showed low output due to RV failure.    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 Cyndia Bent with oversewn aortic valve under DT criteria.  Pt is post op day 7  from left ureteroscopy with laser lithotripsy, retrograde peylogram, left ureteral stent, and  resection of bladder tumor.    Vital signs: Temp: 98.9 HR: 60 Doppler Pressure:  90 Automatic BP:  96/79 (77) O2 Sat:  97% on RA   Weight: 194>191>196>197>196>197>199>200>197>195>195 lbs  GTTS: Milrinone 0.1 mcg/kg/min- stopped 12/10/17 Heparin - 12/06/17 off 12/08/17  LVAD interrogation reveals:  Speed: 8800 Flow:  4.2 Power:  4.7w PI: 6.7 Alarms: none Events:  none  Fixed speed: 8800 Low speed limit: 8200  Drive Line: Sorbaview dressing dry and intact; anchor intact and accurately applied. Weekly dressing changes; Last changed 12/08/17 by [name], his partner. Next change is due 12/15/17.  Labs:  LDH trend: 362>332>270>275>280>269>285>319>259>253>288  INR trend: 2.20>2.12>1.89>1.90>1.75>1.58>1.48>1.33>1.27>2.11>2.35>1.85  Creat trend: 1.46>1.33>2.21>2.46>6.25>  Anticoagulation Plan: - INR Goal:  2.0 - 2.5 - ASA Dose: no ASA (removed 02/2017 due to GI bleed)  Device: -Dual [employer] ICD -Therapies: on at 200 bpm - Last device check 10/02/17  Significant Events on VAD Support:  09/01/14>>ureteral stone with cystoscopy and flexible ureteroscopy with lithotripsy and basket extraction 02/2015 >>GIB with 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 Started on Plavix 3/17>>Lumbar fusion L3-L5 9/17>>VT with DC-CV; amio increased 10/17>>ramp with RV failure; speed decreased to 8800 02/2017> GIB (removed ASA, scopes negative) 03/2017>percutaneous lead repair after  pump stops  10/18>>Slow VT. Cardioverted in ED and amiodarone uptitrated 12/18>>melena with scope. Hgb 6.5. EGD with 2 AVMs with APC 12/18>>recurrent hematuria. 6 mm left ureteral stone 1/19>>7 mm left ureteral stone removal with laser lithotripsy; left ureteral stent. Resection of 6 cm bladder tumor   Plan/Recommendations:   1. Please call VAD pager if any issues with VAD equipment or questions, concerns.  Balinda Quails RN, VAD Coordinator 24/7 pager [phone]

## 2017-12-11 NOTE — Progress Notes (Signed)
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 Haroldine Laws presenting for hematuria and elective uteroscopy with need for management of his anticoagulation prior to surgery now with AKI on CKD 2/2 likely hemodynamic insult and obstruction.   AKI on CKD III: Likely 2/2 combined hemodynamic insult during surgery and acute L moderate hydronephrosis seen on [place] 2/3 (stent subsequently removed with clot present). Renal US now with bilateral hydronephrosis, persistent on L. Now oliguric.  -daily RFPs -daily assessment for need for intermittent HD, no HD 2/6  Bilateral hydronephrosis: per urology. Question need for repeat intervention, although appreciate tenuous surgical status given LVAD. CT read with mild hydronephrosis and hydroureter. Urology considering perc nephrostomy tube.  -per urology  Acute on Chronic biventricular systolic HF: managed by advanced HF team.  -per primary team  Recurrent renal stone with hematuria and UTI -per urology  Bladder tumor -per urology  LVAD per HF team  Subjective:   Feels better this morning. In good spirits. Denies pain.    Objective:   BP 96/79 (BP Location: Left Arm)   Pulse 60   Temp 98.9 F (37.2 C) (Oral)   Resp 12   Ht 6' (1.829 m)   Wt 195 lb 1.7 oz (88.5 kg)   SpO2 97%   BMI 26.46 kg/m   Intake/Output Summary (Last 24 hours) at 12/11/2017 0852 Last data filed at 12/11/2017 0353 Gross per 24 hour  Intake 687.71 ml  Output 1850 ml  Net -1162.29 ml   Weight change: 6 lb 6.3 oz (2.9 kg)  Physical Exam: TIW:PYKD elderly male lying in bed in NAD CVS: LVAD with single mechanical heart sound.  Resp: CTAB easy WOB Abd: soft, Nontender Ext: no edema  Imaging: US Renal  Addendum Date:  12/10/2017   ADDENDUM REPORT: 12/10/2017 16:48 ADDENDUM: Renal ultrasound images were reviewed and discussed with Dr. Gloriann Loan. On the right side, current ultrasound shows mild prominence of the collecting system consistent with minimal/mild hydronephrosis. On the left side, current ultrasound shows significant improvement in hydronephrosis after ureteral stent removal with no significant left hydronephrosis identified. There is some mild dilatation of the left renal collecting system. Electronically Signed   By: Aletta Edouard M.D.   On: 12/10/2017 16:48   Result Date: 12/10/2017 CLINICAL DATA:  Chronic renal insufficiency, hypertension. EXAM: RENAL / URINARY TRACT ULTRASOUND COMPLETE COMPARISON:  Renal ultrasound of January 05, 2018 FINDINGS: Right Kidney: Length: 11.2 cm. There is diffuse cortical thinning. There is mild hydronephrosis. There is a exophytic upper pole cyst measuring 7.4 cm in greatest dimension. There is a smaller upper pole cortical cyst measuring 3.8 cm in greatest dimension. Left Kidney: Length: 11.4 cm. There is mild cortical thinning. The cortical echotexture is similar to that on the right. There is mild hydronephrosis. There is a lateral midpole cortical cyst measuring 1.8 cm in diameter. There is a lower pole cyst measuring 3.8 cm in diameter. Bladder: The urinary bladder is decompressed with a Foley catheter. IMPRESSION: Interval development of right-sided hydronephrosis. Stable left-sided hydronephrosis. Bilateral renal cortical cysts and cortical thinning. Electronically Signed: By: David  Martinique M.D. On: 12/10/2017 07:11   Ir Fluoro Guide Cv Line Right  Result Date: 12/10/2017 CLINICAL DATA:  Progressive renal failure requiring hemodialysis. Request has  been made to place a temporary non tunneled dialysis catheter. EXAM: NON-TUNNELED CENTRAL VENOUS CATHETER PLACEMENT WITH ULTRASOUND AND FLUOROSCOPIC GUIDANCE FLUOROSCOPY TIME:  18 seconds.  9.2 mGy. PROCEDURE: The procedure, risks,  benefits, and alternatives were explained to the patient. Questions regarding the procedure were encouraged and answered. The patient understands and consents to the procedure. A time-out was performed prior to initiating the procedure. Ultrasound was used to confirm patency of the right internal jugular vein. The right neck and chest were prepped with chlorhexidine in a sterile fashion, and a sterile drape was applied covering the operative field. Maximum barrier sterile technique with sterile gowns and gloves were used for the procedure. Local anesthesia was provided with 1% lidocaine. After creating a small venotomy incision, a 21 gauge needle was advanced into the right internal jugular vein under direct, real-time ultrasound guidance. Ultrasound image documentation was performed. Venous access was dilated over a guidewire. A 20 cm, triple lumen 12 Fr Mahurkar non tunneled hemodialysis catheter was placed over a guidewire. Final catheter positioning was confirmed and documented with a fluoroscopic spot image. The catheter was aspirated, flushed with saline, and injected with appropriate volume heparin dwells. The catheter exit site was secured with a 0-Prolene retention suture. COMPLICATIONS: None.  No pneumothorax. FINDINGS: After catheter placement, the tip lies at the cavoatrial junction. The catheter aspirates normally and is ready for immediate use. IMPRESSION: Placement of non-tunneled central venous hemodialysis catheter via the right internal jugular vein. The catheter tip lies at the cavoatrial junction. The catheter is ready for immediate use. Electronically Signed   By: Aletta Edouard M.D.   On: 12/10/2017 16:41   Ir US Guide Vasc Access Right  Result Date: 12/10/2017 CLINICAL DATA:  Progressive renal failure requiring hemodialysis. Request has been made to place a temporary non tunneled dialysis catheter. EXAM: NON-TUNNELED CENTRAL VENOUS CATHETER PLACEMENT WITH ULTRASOUND AND FLUOROSCOPIC  GUIDANCE FLUOROSCOPY TIME:  18 seconds.  9.2 mGy. PROCEDURE: The procedure, risks, benefits, and alternatives were explained to the patient. Questions regarding the procedure were encouraged and answered. The patient understands and consents to the procedure. A time-out was performed prior to initiating the procedure. Ultrasound was used to confirm patency of the right internal jugular vein. The right neck and chest were prepped with chlorhexidine in a sterile fashion, and a sterile drape was applied covering the operative field. Maximum barrier sterile technique with sterile gowns and gloves were used for the procedure. Local anesthesia was provided with 1% lidocaine. After creating a small venotomy incision, a 21 gauge needle was advanced into the right internal jugular vein under direct, real-time ultrasound guidance. Ultrasound image documentation was performed. Venous access was dilated over a guidewire. A 20 cm, triple lumen 12 Fr Mahurkar non tunneled hemodialysis catheter was placed over a guidewire. Final catheter positioning was confirmed and documented with a fluoroscopic spot image. The catheter was aspirated, flushed with saline, and injected with appropriate volume heparin dwells. The catheter exit site was secured with a 0-Prolene retention suture. COMPLICATIONS: None.  No pneumothorax. FINDINGS: After catheter placement, the tip lies at the cavoatrial junction. The catheter aspirates normally and is ready for immediate use. IMPRESSION: Placement of non-tunneled central venous hemodialysis catheter via the right internal jugular vein. The catheter tip lies at the cavoatrial junction. The catheter is ready for immediate use. Electronically Signed   By: Aletta Edouard M.D.   On: 12/10/2017 16:41   Ct Renal Stone Study  Result Date: 12/11/2017 CLINICAL DATA:  Hydronephrosis history of  trans urethral resection of bladder tumor EXAM: CT ABDOMEN AND PELVIS WITHOUT CONTRAST TECHNIQUE: Multidetector CT  imaging of the abdomen and pelvis was performed following the standard protocol without IV contrast. COMPARISON:  Ultrasound 12/10/2017, 12/08/2017, 12/06/2017 CT 12/03/2017, 10/18/2017 FINDINGS: Lower chest: Emphysema at the bilateral lung bases. Previously demonstrated left lower lobe pulmonary nodule not well appreciated on the current exam. Trace bilateral pleural effusion. Cardiomegaly with left ventricular assist device and cardiac pacing leads again visible. Artifact from the LVAD limits the study. Hepatobiliary: No focal liver abnormality is seen. Status post cholecystectomy. No biliary dilatation. Pancreas: Unremarkable. No pancreatic ductal dilatation or surrounding inflammatory changes. Spleen: Normal in size without focal abnormality. Adrenals/Urinary Tract: Adrenal glands are within normal limits. Bilateral renal cysts are again demonstrated, on the left with peripheral calcification. Mild left hydronephrosis and hydroureter. The previously noted ureteral stone on the comparison CT is no longer evident. Slight dilatation of right extrarenal pelvis without calyceal dilatation. Slight prominence of the right ureter, more so distally without stone identified. Thick-walled decompressed urinary bladder. Stomach/Bowel: Stomach is nonenlarged. No dilated small bowel. No colon wall thickening. Appendix not well seen. Sigmoid colon diverticula without acute inflammation. Vascular/Lymphatic: No significantly enlarged lymph nodes. 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 Probable renal artery grafts. Changes suggesting prior aortofemoral bypass. Moderate aortic atherosclerosis. Reproductive: Negative Other: Edema and small amount fluid in the presacral region. Negative for free air. Musculoskeletal: Postsurgical changes with stabilization rod and fixating screws L3 and L5 with marked compression fracture of L4. IMPRESSION: 1. Mild left hydronephrosis and mild to moderate hydroureter. The previously noted  ureteral stone is no longer visible. Minimal dilatation of right renal collecting system without significant caliceal dilatation/hydronephrosis. Prominence of the distal right ureter, no stone disease visualized. Foley catheter in the bladder which is thick-walled and decompressed. 2. Edema and fluid within the presacral region, new since prior study. 3. Small bilateral pleural effusions. Moderate emphysema at the bases 4. 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 Electronically Signed   By: Donavan Foil M.D.   On: 12/11/2017 00:18    Labs: BMET Recent Labs  Lab 12/05/17 1016 12/06/17 0327 12/07/17 0212 12/08/17 0505 12/09/17 0418 12/10/17 0430  NA 135 134* 132* 132* 134* 135  K 4.6 4.5 4.6 4.5 4.3 4.5  CL 107 103 102 100* 103 101  CO2 19* 19* 21* 21* 19* 20*  GLUCOSE 145* 124* 131* 123* 126* 126*  BUN 24* 27* 27* 40* 49* 65*  CREATININE 2.21* 2.46* 2.47* 3.92* 4.89* 6.25*  CALCIUM 8.7* 8.8* 8.4* 8.2* 8.3* 8.3*   CBC Recent Labs  Lab 12/08/17 0505 12/09/17 0418 12/10/17 0430 12/11/17 0355  WBC 8.1 7.6 8.7 6.4  NEUTROABS 5.9 5.4 6.2 4.4  HGB 8.5* 9.2* 8.9* 9.5*  HCT 26.6* 28.7* 28.3* 30.0*  MCV 89.0 88.3 88.2 87.7  PLT 146* 163 182 190    Medications:    . amiodarone  200 mg Oral Daily  . atorvastatin  80 mg Oral q1800  . docusate sodium  200 mg Oral Daily  . levothyroxine  150 mcg Oral QAC breakfast  . mouth rinse  15 mL Mouth Rinse BID  . pantoprazole  40 mg Oral Daily  . ranolazine  500 mg Oral BID  . sildenafil  20 mg Oral TID  . sodium chloride flush  3 mL Intravenous Q12H  . tamsulosin  0.4 mg Oral Daily    Ralene Ok, MD 12/11/17

## 2017-12-12 LAB — TSH: TSH: 0.995 u[IU]/mL (ref 0.350–4.500)

## 2017-12-12 LAB — CBC WITH DIFFERENTIAL/PLATELET
BASOS ABS: 0 10*3/uL (ref 0.0–0.1)
BASOS PCT: 1 %
EOS ABS: 0.3 10*3/uL (ref 0.0–0.7)
EOS PCT: 5 %
HCT: 30.2 % — ABNORMAL LOW (ref 39.0–52.0)
Hemoglobin: 9.4 g/dL — ABNORMAL LOW (ref 13.0–17.0)
Lymphocytes Relative: 12 %
Lymphs Abs: 0.7 10*3/uL (ref 0.7–4.0)
MCH: 27.9 pg (ref 26.0–34.0)
MCHC: 31.1 g/dL (ref 30.0–36.0)
MCV: 89.6 fL (ref 78.0–100.0)
Monocytes Absolute: 1.1 10*3/uL — ABNORMAL HIGH (ref 0.1–1.0)
Monocytes Relative: 19 %
Neutro Abs: 3.6 10*3/uL (ref 1.7–7.7)
Neutrophils Relative %: 63 %
PLATELETS: 216 10*3/uL (ref 150–400)
RBC: 3.37 MIL/uL — AB (ref 4.22–5.81)
RDW: 17.8 % — ABNORMAL HIGH (ref 11.5–15.5)
WBC: 5.8 10*3/uL (ref 4.0–10.5)

## 2017-12-12 LAB — BASIC METABOLIC PANEL
ANION GAP: 12 (ref 5–15)
BUN: 36 mg/dL — ABNORMAL HIGH (ref 6–20)
CO2: 25 mmol/L (ref 22–32)
Calcium: 8.2 mg/dL — ABNORMAL LOW (ref 8.9–10.3)
Chloride: 100 mmol/L — ABNORMAL LOW (ref 101–111)
Creatinine, Ser: 2.8 mg/dL — ABNORMAL HIGH (ref 0.61–1.24)
GFR, EST AFRICAN AMERICAN: 23 mL/min — AB (ref >60)
GFR, EST NON AFRICAN AMERICAN: 20 mL/min — AB (ref >60)
GLUCOSE: 113 mg/dL — AB (ref 65–99)
Potassium: 4.6 mmol/L (ref 3.5–5.1)
SODIUM: 137 mmol/L (ref 135–145)

## 2017-12-12 LAB — COOXEMETRY PANEL
Carboxyhemoglobin: 1.8 % — ABNORMAL HIGH (ref 0.5–1.5)
METHEMOGLOBIN: 1.3 % (ref 0.0–1.5)
O2 Saturation: 83.5 %
Total hemoglobin: 9.5 g/dL — ABNORMAL LOW (ref 12.0–16.0)

## 2017-12-12 LAB — HEPARIN LEVEL (UNFRACTIONATED): Heparin Unfractionated: 0.45 IU/mL (ref 0.30–0.70)

## 2017-12-12 LAB — PROTIME-INR
INR: 1.54
Prothrombin Time: 18.4 seconds — ABNORMAL HIGH (ref 11.4–15.2)

## 2017-12-12 LAB — LACTATE DEHYDROGENASE: LDH: 272 U/L — AB (ref 98–192)

## 2017-12-12 MED ORDER — WARFARIN - PHARMACIST DOSING INPATIENT: Freq: Every day | Status: DC

## 2017-12-12 MED ORDER — HEPARIN (PORCINE) IN NACL 100-0.45 UNIT/ML-% IJ SOLN
1000.0000 [IU]/h | INTRAMUSCULAR | Status: DC
  Administered 2017-12-12: 1500 [IU]/h via INTRAVENOUS
  Administered 2017-12-13: 1000 [IU]/h via INTRAVENOUS
  Filled 2017-12-12 (×2): qty 250

## 2017-12-12 MED ORDER — WARFARIN SODIUM 4 MG PO TABS
4.0000 mg | ORAL_TABLET | Freq: Once | ORAL | Status: AC
  Administered 2017-12-12: 4 mg via ORAL
  Filled 2017-12-12: qty 1

## 2017-12-12 NOTE — Progress Notes (Signed)
Advanced Heart Failure VAD Team Note  Subjective:    Started Rocephin 12/03/17   CT Renal Stone. Stable L ureteral stone non obstructing. Increasing filling defect within the urinary bladder suspicious for an underlying mass. Although this may simply represent a hematoma,urologic consultation and direct visualization is recommended.  Cystocopy 12/04/17  - s/p Left ureteroscopy and stent placement.  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 Had lower abdominal pain so bladder irrigation started. Unfortunately he had a blockage so irrigation was stopped.  -2/5 temp HD cath placed. Had HD  Denies SOB. Urine cleared over night.    LDH 272 Hgb 9.4 Creatinine 2.8   LVAD INTERROGATION:  HeartMate II LVAD:  Flow 4  liters/min, speed 8800 power 5  PI 6.7 .3  Objective:    Vital Signs:   Temp:  [97.9 F (36.6 C)-99 F (37.2 C)] 97.9 F (36.6 C) (02/07 0811) Pulse Rate:  [59-65] 65 (02/07 0811) Resp:  [14-20] 20 (02/07 0811) BP: (84-103)/(72-85) 97/85 (02/07 0811) SpO2:  [91 %-97 %] 91 % (02/07 0811) Weight:  [194 lb 10.7 oz (88.3 kg)] 194 lb 10.7 oz (88.3 kg) (02/07 0545) Last BM Date: 12/11/17 Mean arterial Pressure Maps 70-80s  Intake/Output:   Intake/Output Summary (Last 24 hours) at 12/12/2017 1004 Last data filed at 12/12/2017 0814 Gross per 24 hour  Intake -  Output 1500 ml  Net -1500 ml     Physical Exam     Physical Exam: GENERAL: NAD in bed  HEENT: normal  NECK: Supple, JVP 6-7 .  2+ bilaterally, no bruits.  No lymphadenopathy or thyromegaly appreciated.  RIJ HD catheter CARDIAC:  Mechanical heart sounds with LVAD hum present.  LUNGS:  Clear to auscultation bilaterally.  ABDOMEN:  Soft, round, nontender, positive bowel sounds x4.     LVAD exit site: well-healed and incorporated.  Dressing dry and intact.  No erythema or drainage.  Stabilization device present and accurately applied.  Driveline dressing  is being changed daily per sterile technique. EXTREMITIES:  Warm and dry, no cyanosis, clubbing, rash or edema  NEUROLOGIC:  Alert and oriented x 4.  Gait steady.  No aphasia.  No dysarthria.  Affect pleasant.    GU: foley yellow urine.      Telemetry  NSR 60s personally reviewed.   EKG    No new tracings.    Labs   Basic Metabolic Panel: Recent Labs  Lab 12/08/17 0505 12/09/17 0418 12/10/17 0430 12/11/17 0827 12/12/17 0624  NA 132* 134* 135 134* 137  K 4.5 4.3 4.5 4.5 4.6  CL 100* 103 101 97* 100*  CO2 21* 19* 20* 24 25  GLUCOSE 123* 126* 126* 145* 113*  BUN 40* 49* 65* 35* 36*  CREATININE 3.92* 4.89* 6.25* 3.15* 2.80*  CALCIUM 8.2* 8.3* 8.3* 8.1* 8.2*    Liver Function Tests: No results for input(s): AST, ALT, ALKPHOS, BILITOT, PROT, ALBUMIN in the last 168 hours. No results for input(s): LIPASE, AMYLASE in the last 168 hours. No results for input(s): AMMONIA in the last 168 hours.  CBC: Recent Labs  Lab 12/08/17 0505 12/09/17 0418 12/10/17 0430 12/11/17 0355 12/12/17 0624  WBC 8.1 7.6 8.7 6.4 5.8  NEUTROABS 5.9 5.4 6.2 4.4 3.6  HGB 8.5* 9.2* 8.9* 9.5* 9.4*  HCT 26.6* 28.7* 28.3* 30.0* 30.2*  MCV 89.0 88.3 88.2 87.7 89.6  PLT 146* 163 182 190 216    INR: Recent Labs  Lab 12/08/17 0505 12/09/17  6834 12/10/17 0430 12/11/17 0355 12/12/17 0624  INR 1.90 2.11 2.35 1.85 1.54    Other results:     Imaging   Ir Fluoro Guide Cv Line Right  Result Date: 12/10/2017 CLINICAL DATA:  Progressive renal failure requiring hemodialysis. Request has been made to place a temporary non tunneled dialysis catheter. EXAM: NON-TUNNELED CENTRAL VENOUS CATHETER PLACEMENT WITH ULTRASOUND AND FLUOROSCOPIC GUIDANCE FLUOROSCOPY TIME:  18 seconds.  9.2 mGy. PROCEDURE: The procedure, risks, benefits, and alternatives were explained to the patient. Questions regarding the procedure were encouraged and answered. The patient understands and consents to the procedure. A  time-out was performed prior to initiating the procedure. Ultrasound was used to confirm patency of the right internal jugular vein. The right neck and chest were prepped with chlorhexidine in a sterile fashion, and a sterile drape was applied covering the operative field. Maximum barrier sterile technique with sterile gowns and gloves were used for the procedure. Local anesthesia was provided with 1% lidocaine. After creating a small venotomy incision, a 21 gauge needle was advanced into the right internal jugular vein under direct, real-time ultrasound guidance. Ultrasound image documentation was performed. Venous access was dilated over a guidewire. A 20 cm, triple lumen 12 Fr Mahurkar non tunneled hemodialysis catheter was placed over a guidewire. Final catheter positioning was confirmed and documented with a fluoroscopic spot image. The catheter was aspirated, flushed with saline, and injected with appropriate volume heparin dwells. The catheter exit site was secured with a 0-Prolene retention suture. COMPLICATIONS: None.  No pneumothorax. FINDINGS: After catheter placement, the tip lies at the cavoatrial junction. The catheter aspirates normally and is ready for immediate use. IMPRESSION: Placement of non-tunneled central venous hemodialysis catheter via the right internal jugular vein. The catheter tip lies at the cavoatrial junction. The catheter is ready for immediate use. Electronically Signed   By: Aletta Edouard M.D.   On: 12/10/2017 16:41   Ir US Guide Vasc Access Right  Result Date: 12/10/2017 CLINICAL DATA:  Progressive renal failure requiring hemodialysis. Request has been made to place a temporary non tunneled dialysis catheter. EXAM: NON-TUNNELED CENTRAL VENOUS CATHETER PLACEMENT WITH ULTRASOUND AND FLUOROSCOPIC GUIDANCE FLUOROSCOPY TIME:  18 seconds.  9.2 mGy. PROCEDURE: The procedure, risks, benefits, and alternatives were explained to the patient. Questions regarding the procedure were  encouraged and answered. The patient understands and consents to the procedure. A time-out was performed prior to initiating the procedure. Ultrasound was used to confirm patency of the right internal jugular vein. The right neck and chest were prepped with chlorhexidine in a sterile fashion, and a sterile drape was applied covering the operative field. Maximum barrier sterile technique with sterile gowns and gloves were used for the procedure. Local anesthesia was provided with 1% lidocaine. After creating a small venotomy incision, a 21 gauge needle was advanced into the right internal jugular vein under direct, real-time ultrasound guidance. Ultrasound image documentation was performed. Venous access was dilated over a guidewire. A 20 cm, triple lumen 12 Fr Mahurkar non tunneled hemodialysis catheter was placed over a guidewire. Final catheter positioning was confirmed and documented with a fluoroscopic spot image. The catheter was aspirated, flushed with saline, and injected with appropriate volume heparin dwells. The catheter exit site was secured with a 0-Prolene retention suture. COMPLICATIONS: None.  No pneumothorax. FINDINGS: After catheter placement, the tip lies at the cavoatrial junction. The catheter aspirates normally and is ready for immediate use. IMPRESSION: Placement of non-tunneled central venous hemodialysis catheter via the right internal jugular  vein. The catheter tip lies at the cavoatrial junction. The catheter is ready for immediate use. Electronically Signed   By: Aletta Edouard M.D.   On: 12/10/2017 16:41   Ct Renal Stone Study  Result Date: 12/11/2017 CLINICAL DATA:  Hydronephrosis history of trans urethral resection of bladder tumor EXAM: CT ABDOMEN AND PELVIS WITHOUT CONTRAST TECHNIQUE: Multidetector CT imaging of the abdomen and pelvis was performed following the standard protocol without IV contrast. COMPARISON:  Ultrasound 12/10/2017, 12/08/2017, 12/06/2017 CT 12/03/2017,  10/18/2017 FINDINGS: Lower chest: Emphysema at the bilateral lung bases. Previously demonstrated left lower lobe pulmonary nodule not well appreciated on the current exam. Trace bilateral pleural effusion. Cardiomegaly with left ventricular assist device and cardiac pacing leads again visible. Artifact from the LVAD limits the study. Hepatobiliary: No focal liver abnormality is seen. Status post cholecystectomy. No biliary dilatation. Pancreas: Unremarkable. No pancreatic ductal dilatation or surrounding inflammatory changes. Spleen: Normal in size without focal abnormality. Adrenals/Urinary Tract: Adrenal glands are within normal limits. Bilateral renal cysts are again demonstrated, on the left with peripheral calcification. Mild left hydronephrosis and hydroureter. The previously noted ureteral stone on the comparison CT is no longer evident. Slight dilatation of right extrarenal pelvis without calyceal dilatation. Slight prominence of the right ureter, more so distally without stone identified. Thick-walled decompressed urinary bladder. Stomach/Bowel: Stomach is nonenlarged. No dilated small bowel. No colon wall thickening. Appendix not well seen. Sigmoid colon diverticula without acute inflammation. Vascular/Lymphatic: No significantly enlarged lymph nodes. 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 Probable renal artery grafts. Changes suggesting prior aortofemoral bypass. Moderate aortic atherosclerosis. Reproductive: Negative Other: Edema and small amount fluid in the presacral region. Negative for free air. Musculoskeletal: Postsurgical changes with stabilization rod and fixating screws L3 and L5 with marked compression fracture of L4. IMPRESSION: 1. Mild left hydronephrosis and mild to moderate hydroureter. The previously noted ureteral stone is no longer visible. Minimal dilatation of right renal collecting system without significant caliceal dilatation/hydronephrosis. Prominence of the distal  right ureter, no stone disease visualized. Foley catheter in the bladder which is thick-walled and decompressed. 2. Edema and fluid within the presacral region, new since prior study. 3. Small bilateral pleural effusions. Moderate emphysema at the bases 4. 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 Electronically Signed   By: Donavan Foil M.D.   On: 12/11/2017 00:18     Medications:     Scheduled Medications: . amiodarone  200 mg Oral Daily  . atorvastatin  80 mg Oral q1800  . docusate sodium  200 mg Oral Daily  . levothyroxine  150 mcg Oral QAC breakfast  . mouth rinse  15 mL Mouth Rinse BID  . pantoprazole  40 mg Oral Daily  . ranolazine  500 mg Oral BID  . sildenafil  20 mg Oral TID  . sodium chloride flush  3 mL Intravenous Q12H  . tamsulosin  0.4 mg Oral Daily    Infusions: . sodium chloride Stopped (12/04/17 1330)  . sodium chloride    . sodium chloride    . sodium chloride    . lactated ringers 0 mL/hr at 12/04/17 1226    PRN Medications: sodium chloride, sodium chloride, sodium chloride, acetaminophen, alteplase, guaiFENesin, heparin, heparin, lidocaine (PF), lidocaine (PF), lidocaine-prilocaine, ondansetron (ZOFRAN) IV, pentafluoroprop-tetrafluoroeth, promethazine, sodium chloride flush, sodium chloride flush   VAD EVENTS   GU Event 09/01/14 had impacted ureteral stone and underwent cystoscopy and flexible ureteroscopy with lithotripsy and basket extraction.   09/2015 Hematuria and chronic nephrolithiasis. Renal US showed  mild hyronephrosis and bilateral renal cysts. Dr. Risa Grill took back for repeat lithotripsy and stone extraction by ureteroscopy and J stent placement. 12/18 Recurrent hematuria. 67mm left ureteral stone. 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  4/2-18 - Episode of melena. Hgb 10.9-> 8.7. EGD/colonoscopy negative 12/18 - Melena with syncope  Hgb 6.5. Transfused 5u. 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  01/2016 R CEA. He was started on Plavix.  01/2016 Lumbar fusion L3-L5 with pedicle screws posterolateral arthrodesis with BMP, allograft  Patient Profile  [name] is a 77 y.o. 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 Required short term milrinone.   Admitted 1/25 for elective ureteroscopy due to R ureteral stone.  Still with occasional hematuria. Continues with fatigue. RHC 1/25 showed low output due to RV failure (see below). Milrinone initiated at 0.125.  Assessment/Plan:    1. 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 Struggling with progressive NYHA III symptoms.  RHC 1/25 showed primarily RHF with low output.  - No real symptomatic improvement with milrinone. Milrinone stopped 2/5  - Volume status stable.   - Continue Sildenafil 20 mg TID with RV failure.  - Continue VAD speed at 8800. VAD interrogated personally. Parameters stable. 2. Recurrent renal stone with ongoing hematuria and UTI: Left ureteral stone in 12/18. - Last dose of coumadin 1-24/2019 - 12/03/2017. CT with L stone and possible mass - Cystoscopy + Left Ureteroscopy with stent placed 12/04/17.  - Completed rocephin 12/03/17. Ucx negative.  - Stent removed by Dr Jeffie Pollock 2/3  - U/s 2/5 with bilateral hydronephrosis -   CT on 2/5 with mild left hydronephrosis. Minimal dilitation on r without hydronephrosis.  Urine now yellow. Creatinine trending down  3. AKI - Creatinine peaked 6.3.--ihd completed 2/5.  Creatinine down to 2.8. - Nephrology appreciated.  - Suspect combination of ATN versus obstruction. Nephrology & Urology following 4. Bladder Tumor - Seen on CT 1/29 and confirmed/resected on cystoscopy 12/04/17. - Pathology c/w  high-grade urothelial CA. Discussed with Dr. Gloriann Loan. Plan outpatient BCG treatment. - As above. Needs further w/u for obstruction  5. LVAD:  - VAD interrogated personally. Parameters stable.  LDH stable.  INR 1.5 . Restart heparin.  .   6. H/o GIB: S/p clipping of 2 jejunal AVMs in 4/16. Continue coumadin and plavix (with h/o CVA). Admitted 4/18 with recurrent GIB. Transfused 2u. EGD/colon negative. Admitted 12/18, Hgb 6.5, Transfused 5u. EGD with 2 AVMs treated with APC - Hgb stable 9.4  7.  Atrial arrhythmias: Paroxysmal atrial fibrillation, atrial tachycardia.  S/p DC-CV 06/04/14, DC-CV 03/02/15 and 01/19/16.  - remains in NSR.  - Continue amiodarone.  8. HTN:  Stable.  9. 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 No further.  - Continue atorvastatin. (also warfarin INR goal 2-2.5). S/p R CEA 01/13/16.  - Plavix stopped 12/18 due to recurrent GIB 10. H/o amio-induced hypothyroidism: Amio restarted due to recurrent atrial arrhythmias and VT.  - Continue home levothyroxine dose. No change.  11. VT:  - Underwent DC-CV 9/17. Repeat episode of slow VT 08/17/17 requiring DC-CV.  - Continue amio 200 daily.  - Goal K > 4.0 Mg >  2.0. -Stable.      Length of Stay: [place], NP 12/12/2017, 10:04 AM  VAD Team --- VAD ISSUES ONLY--- Pager [phone] (7am - 7am)  Advanced Heart Failure Team  Pager [phone] (M-F; 7a - 4p)   Please contact [facility] for night-coverage after hours (4p -7a ) and weekends on [email or web address]   Patient seen and examined with Darrick Grinder, NP. We discussed all aspects of the encounter. I agree with the assessment and plan as stated above.   He is feeling better. Urine clearing. No flank pain. Creatinine now improving. Co-ox and volume status look good. VAD interrogated personally. Parameters stable.  On exam volumes status looks good RiJ trialysis cath cvp 6-7 COR + lvad hum Ab soft nt No edema   INR is low. Will start heparin (no bolus). Discussed dosing with  PharmD personally. If renal function continues to improve will pull HD cath tomorrow. Likely can pull Foley soon too.   Glori Bickers, MD  5:22 PM

## 2017-12-12 NOTE — Progress Notes (Signed)
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 Haroldine Laws presenting for hematuria and elective uteroscopy with need for management of his anticoagulation prior to surgery now with AKI on CKD 2/2 likely hemodynamic insult and obstruction.   AKI on CKD III: Likely 2/2 combined hemodynamic insult during surgery and acute L moderate hydronephrosis seen on [place] 2/3 (stent subsequently removed with clot present). CT with mild hydronephrosis.  -daily RFPs -daily assessment for need for intermittent HD, no HD 2/7 -consider d/c HD cath  Bilateral hydronephrosis: per urology. Question need for repeat intervention, although appreciate tenuous surgical status given LVAD. CT read with mild hydronephrosis and hydroureter. Urology considering perc nephrostomy tube.  -per urology  Acute on Chronic biventricular systolic HF: managed by advanced HF team.  -per primary team  Recurrent renal stone with hematuria and UTI -per urology  Bladder tumor -per urology  LVAD per HF team  Subjective:   Feels better this morning. In good spirits. Denies pain. Urine clear. Pleased to take O2 off.    Objective:   BP 97/85 (BP Location: Left Arm)   Pulse 65   Temp 97.9 F (36.6 C) (Oral)   Resp 20   Ht 6' (1.829 m)   Wt 194 lb 10.7 oz (88.3 kg)   SpO2 91%   BMI 26.40 kg/m   Intake/Output Summary (Last 24 hours) at 12/12/2017 0820 Last data filed at 12/12/2017 0938 Gross per 24 hour  Intake -  Output 1500 ml  Net -1500 ml   Weight change: -11.5 oz (-3.5 kg)  Physical Exam: HWE:XHBZ elderly male lying in bed in NAD CVS: LVAD with single mechanical heart sound.  Resp: CTAB easy WOB Abd: soft, Nontender Ext: no edema  Imaging: Ir Fluoro Guide Cv Line  Right  Result Date: 12/10/2017 CLINICAL DATA:  Progressive renal failure requiring hemodialysis. Request has been made to place a temporary non tunneled dialysis catheter. EXAM: NON-TUNNELED CENTRAL VENOUS CATHETER PLACEMENT WITH ULTRASOUND AND FLUOROSCOPIC GUIDANCE FLUOROSCOPY TIME:  18 seconds.  9.2 mGy. PROCEDURE: The procedure, risks, benefits, and alternatives were explained to the patient. Questions regarding the procedure were encouraged and answered. The patient understands and consents to the procedure. A time-out was performed prior to initiating the procedure. Ultrasound was used to confirm patency of the right internal jugular vein. The right neck and chest were prepped with chlorhexidine in a sterile fashion, and a sterile drape was applied covering the operative field. Maximum barrier sterile technique with sterile gowns and gloves were used for the procedure. Local anesthesia was provided with 1% lidocaine. After creating a small venotomy incision, a 21 gauge needle was advanced into the right internal jugular vein under direct, real-time ultrasound guidance. Ultrasound image documentation was performed. Venous access was dilated over a guidewire. A 20 cm, triple lumen 12 Fr Mahurkar non tunneled hemodialysis catheter was placed over a guidewire. Final catheter positioning was confirmed and documented with a fluoroscopic spot image. The catheter was aspirated, flushed with saline, and injected with appropriate volume heparin dwells. The catheter exit site was secured with a 0-Prolene retention suture. COMPLICATIONS: None.  No pneumothorax. FINDINGS: After catheter placement, the tip lies at the cavoatrial junction. The catheter aspirates normally and is ready for immediate use. IMPRESSION: Placement  of non-tunneled central venous hemodialysis catheter via the right internal jugular vein. The catheter tip lies at the cavoatrial junction. The catheter is ready for immediate use. Electronically Signed    By: Aletta Edouard M.D.   On: 12/10/2017 16:41   Ir US Guide Vasc Access Right  Result Date: 12/10/2017 CLINICAL DATA:  Progressive renal failure requiring hemodialysis. Request has been made to place a temporary non tunneled dialysis catheter. EXAM: NON-TUNNELED CENTRAL VENOUS CATHETER PLACEMENT WITH ULTRASOUND AND FLUOROSCOPIC GUIDANCE FLUOROSCOPY TIME:  18 seconds.  9.2 mGy. PROCEDURE: The procedure, risks, benefits, and alternatives were explained to the patient. Questions regarding the procedure were encouraged and answered. The patient understands and consents to the procedure. A time-out was performed prior to initiating the procedure. Ultrasound was used to confirm patency of the right internal jugular vein. The right neck and chest were prepped with chlorhexidine in a sterile fashion, and a sterile drape was applied covering the operative field. Maximum barrier sterile technique with sterile gowns and gloves were used for the procedure. Local anesthesia was provided with 1% lidocaine. After creating a small venotomy incision, a 21 gauge needle was advanced into the right internal jugular vein under direct, real-time ultrasound guidance. Ultrasound image documentation was performed. Venous access was dilated over a guidewire. A 20 cm, triple lumen 12 Fr Mahurkar non tunneled hemodialysis catheter was placed over a guidewire. Final catheter positioning was confirmed and documented with a fluoroscopic spot image. The catheter was aspirated, flushed with saline, and injected with appropriate volume heparin dwells. The catheter exit site was secured with a 0-Prolene retention suture. COMPLICATIONS: None.  No pneumothorax. FINDINGS: After catheter placement, the tip lies at the cavoatrial junction. The catheter aspirates normally and is ready for immediate use. IMPRESSION: Placement of non-tunneled central venous hemodialysis catheter via the right internal jugular vein. The catheter tip lies at the  cavoatrial junction. The catheter is ready for immediate use. Electronically Signed   By: Aletta Edouard M.D.   On: 12/10/2017 16:41   Ct Renal Stone Study  Result Date: 12/11/2017 CLINICAL DATA:  Hydronephrosis history of trans urethral resection of bladder tumor EXAM: CT ABDOMEN AND PELVIS WITHOUT CONTRAST TECHNIQUE: Multidetector CT imaging of the abdomen and pelvis was performed following the standard protocol without IV contrast. COMPARISON:  Ultrasound 12/10/2017, 12/08/2017, 12/06/2017 CT 12/03/2017, 10/18/2017 FINDINGS: Lower chest: Emphysema at the bilateral lung bases. Previously demonstrated left lower lobe pulmonary nodule not well appreciated on the current exam. Trace bilateral pleural effusion. Cardiomegaly with left ventricular assist device and cardiac pacing leads again visible. Artifact from the LVAD limits the study. Hepatobiliary: No focal liver abnormality is seen. Status post cholecystectomy. No biliary dilatation. Pancreas: Unremarkable. No pancreatic ductal dilatation or surrounding inflammatory changes. Spleen: Normal in size without focal abnormality. Adrenals/Urinary Tract: Adrenal glands are within normal limits. Bilateral renal cysts are again demonstrated, on the left with peripheral calcification. Mild left hydronephrosis and hydroureter. The previously noted ureteral stone on the comparison CT is no longer evident. Slight dilatation of right extrarenal pelvis without calyceal dilatation. Slight prominence of the right ureter, more so distally without stone identified. Thick-walled decompressed urinary bladder. Stomach/Bowel: Stomach is nonenlarged. No dilated small bowel. No colon wall thickening. Appendix not well seen. Sigmoid colon diverticula without acute inflammation. Vascular/Lymphatic: No significantly enlarged lymph nodes. 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 Probable renal artery grafts. Changes suggesting prior aortofemoral bypass. Moderate aortic  atherosclerosis. Reproductive: Negative Other: Edema and small amount fluid in the presacral  region. Negative for free air. Musculoskeletal: Postsurgical changes with stabilization rod and fixating screws L3 and L5 with marked compression fracture of L4. IMPRESSION: 1. Mild left hydronephrosis and mild to moderate hydroureter. The previously noted ureteral stone is no longer visible. Minimal dilatation of right renal collecting system without significant caliceal dilatation/hydronephrosis. Prominence of the distal right ureter, no stone disease visualized. Foley catheter in the bladder which is thick-walled and decompressed. 2. Edema and fluid within the presacral region, new since prior study. 3. Small bilateral pleural effusions. Moderate emphysema at the bases 4. 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 Electronically Signed   By: Donavan Foil M.D.   On: 12/11/2017 00:18    Labs: BMET Recent Labs  Lab 12/06/17 0327 12/07/17 1740 12/08/17 0505 12/09/17 0418 12/10/17 0430 12/11/17 0827 12/12/17 0624  NA 134* 132* 132* 134* 135 134* 137  K 4.5 4.6 4.5 4.3 4.5 4.5 4.6  CL 103 102 100* 103 101 97* 100*  CO2 19* 21* 21* 19* 20* 24 25  GLUCOSE 124* 131* 123* 126* 126* 145* 113*  BUN 27* 27* 40* 49* 65* 35* 36*  CREATININE 2.46* 2.47* 3.92* 4.89* 6.25* 3.15* 2.80*  CALCIUM 8.8* 8.4* 8.2* 8.3* 8.3* 8.1* 8.2*   CBC Recent Labs  Lab 12/09/17 0418 12/10/17 0430 12/11/17 0355 12/12/17 0624  WBC 7.6 8.7 6.4 5.8  NEUTROABS 5.4 6.2 4.4 3.6  HGB 9.2* 8.9* 9.5* 9.4*  HCT 28.7* 28.3* 30.0* 30.2*  MCV 88.3 88.2 87.7 89.6  PLT 163 182 190 216    Medications:    . amiodarone  200 mg Oral Daily  . atorvastatin  80 mg Oral q1800  . docusate sodium  200 mg Oral Daily  . levothyroxine  150 mcg Oral QAC breakfast  . mouth rinse  15 mL Mouth Rinse BID  . pantoprazole  40 mg Oral Daily  . ranolazine  500 mg Oral BID  . sildenafil  20 mg Oral TID  . sodium chloride flush  3 mL Intravenous Q12H  .  tamsulosin  0.4 mg Oral Daily    Ralene Ok, MD 12/12/17

## 2017-12-12 NOTE — Progress Notes (Signed)
LVAD Coordinator Rounding Note:  Admitted 11/29/17 due for elective ureteroscopy due to right ureteral stone. RHC showed low output due to RV failure.    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 Cyndia Bent with oversewn aortic valve under DT criteria.  Pt is post op day 8  from left ureteroscopy with laser lithotripsy, retrograde peylogram, left ureteral stent, and  resection of bladder tumor.    Vital signs: Temp: 97.9 HR: 65 Doppler Pressure:  90 Automatic BP:  97/85 O2 Sat:  97% on RA   Weight: 194>191>196>197>196>197>199>200>197>195>195>194 lbs  GTTS: Milrinone 0.1 mcg/kg/min- stopped 12/10/17 Heparin - 12/06/17 off 12/08/17  LVAD interrogation reveals:  Speed: 8800 Flow:  4.0 Power:  4.6w PI: 7.3 Alarms: none Events:  none  Fixed speed: 8800 Low speed limit: 8200  Drive Line: Sorbaview dressing dry and intact; anchor intact and accurately applied. Weekly dressing changes; Last changed 12/08/17 by [name], his partner. Next change is due 12/15/17.  Labs:  LDH trend: 362>332>270>275>280>269>285>319>259>253>288>272  INR trend: 2.20>2.12>1.89>1.90>1.75>1.58>1.48>1.33>1.27>2.11>2.35>1.85>1.54  Creat trend: 1.46>1.33>2.21>2.46>6.25>2.8  Anticoagulation Plan: - INR Goal:  2.0 - 2.5 - ASA Dose: no ASA (removed 02/2017 due to GI bleed)  Device: -Dual [employer] ICD -Therapies: on at 200 bpm - Last device check 10/02/17  Significant Events on VAD Support:  09/01/14>>ureteral stone with cystoscopy and flexible ureteroscopy with lithotripsy and basket extraction 02/2015 >>GIB with 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 Started on Plavix 3/17>>Lumbar fusion L3-L5 9/17>>VT with DC-CV; amio increased 10/17>>ramp with RV failure; speed decreased to 8800 02/2017> GIB (removed ASA, scopes negative) 03/2017>percutaneous lead  repair after pump stops  10/18>>Slow VT. Cardioverted in ED and amiodarone uptitrated 12/18>>melena with scope. Hgb 6.5. EGD with 2 AVMs with APC 12/18>>recurrent hematuria. 6 mm left ureteral stone 1/19>>7 mm left ureteral stone removal with laser lithotripsy; left ureteral stent. Resection of 6 cm bladder tumor   Plan/Recommendations:   1. Please call VAD pager if any issues with VAD equipment or questions, concerns.  Tanda Rockers RN, VAD Coordinator 24/7 pager [phone]

## 2017-12-12 NOTE — Progress Notes (Signed)
Urology Inpatient Progress Report  Acute on chronic systolic (congestive) heart failure (HCC) [I50.23]  Procedure(s): CYSTOSCOPY WITH left RETROGRADE PYELOGRAM, LEFT URETEROSCOPY AND STENT PLACEMENT HOLMIUM LASER APPLICATION TRANSURETHRAL RESECTION OF BLADDER TUMOR (TURBT)  8 Days Post-Op   Intv/Subj: No acute events overnight. Patient is without complaint. Cr improved to 2.8, UOP improving. Urine clear yellow.  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 0.9 %  sodium chloride infusion  250 mL Intravenous PRN Bensimhon, Shaune Pascal, MD   Stopped at 12/04/17 1330  . 0.9 %  sodium chloride infusion   Intravenous Once Kyung Rudd, CRNA      . 0.9 %  sodium chloride infusion  100 mL Intravenous PRN Elmarie Shiley, MD      . 0.9 %  sodium chloride infusion  100 mL Intravenous PRN Elmarie Shiley, MD      . acetaminophen (TYLENOL) tablet 650 mg  650 mg Oral Q4H PRN Clegg, Amy D, NP   650 mg at 12/09/17 2350  . alteplase (CATHFLO ACTIVASE) injection 2 mg  2 mg Intracatheter Once PRN Elmarie Shiley, MD      . amiodarone (PACERONE) tablet 200 mg  200 mg Oral Daily Larey Dresser, MD   200 mg at 12/11/17 0998  . atorvastatin (LIPITOR) tablet 80 mg  80 mg Oral q1800 Larey Dresser, MD   80 mg at 12/11/17 1706  . docusate sodium (COLACE) capsule 200 mg  200 mg Oral Daily Larey Dresser, MD   200 mg at 12/11/17 3382  . guaiFENesin (ROBITUSSIN) 100 MG/5ML solution 100 mg  5 mL Oral Q4H PRN Bensimhon, Shaune Pascal, MD   100 mg at 12/08/17 2300  . heparin injection 1,000 Units  1,000 Units Dialysis PRN Elmarie Shiley, MD      . heparin injection   Intravenous PRN Aletta Edouard, MD   2,800 Units at 12/10/17 1546  . lactated ringers infusion   Intravenous Continuous  Effie Berkshire, MD 0 mL/hr at 12/04/17 1226    . levothyroxine (SYNTHROID, LEVOTHROID) tablet 150 mcg  150 mcg Oral QAC breakfast Einar Grad, RPH   150 mcg at 12/11/17 0801  . lidocaine (PF) (XYLOCAINE) 1 % injection 5 mL  5 mL Intradermal PRN Elmarie Shiley, MD      . lidocaine (PF) (XYLOCAINE) 1 % injection    PRN Aletta Edouard, MD   2 mL at 12/10/17 1545  . lidocaine-prilocaine (EMLA) cream 1 application  1 application Topical PRN Elmarie Shiley, MD      . MEDLINE mouth rinse  15 mL Mouth Rinse BID Bensimhon, Shaune Pascal, MD      . ondansetron [facility]) injection 4 mg  4 mg Intravenous Q6H PRN Clegg, Amy D, NP   4 mg at 12/09/17 1513  . pantoprazole (PROTONIX) EC tablet 40 mg  40 mg Oral Daily Larey Dresser, MD   40 mg at 12/11/17 5053  . pentafluoroprop-tetrafluoroeth (GEBAUERS) aerosol 1 application  1 application Topical PRN Elmarie Shiley, MD      . promethazine (PHENERGAN) injection 12.5 mg  12.5 mg Intravenous Q6H PRN Clegg, Amy D, NP   12.5 mg at 12/09/17 2350  . ranolazine (RANEXA) 12 hr tablet 500 mg  500 mg Oral BID Loralie Champagne  S, MD   500 mg at 12/11/17 2100  . sildenafil (REVATIO) tablet 20 mg  20 mg Oral TID Bensimhon, Shaune Pascal, MD   20 mg at 12/11/17 2100  . sodium chloride flush (NS) 0.9 % injection 10-40 mL  10-40 mL Intracatheter PRN Bensimhon, Shaune Pascal, MD   10 mL at 12/07/17 2121  . sodium chloride flush (NS) 0.9 % injection 3 mL  3 mL Intravenous Q12H Bensimhon, Shaune Pascal, MD   3 mL at 12/11/17 2100  . sodium chloride flush (NS) 0.9 % injection 3 mL  3 mL Intravenous PRN Bensimhon, Shaune Pascal, MD      . tamsulosin [facility]) capsule 0.4 mg  0.4 mg Oral Daily Larey Dresser, MD   0.4 mg at 12/11/17 1540     Objective: Vital: Vitals:   12/11/17 1642 12/11/17 1933 12/11/17 2331 12/12/17 0545  BP: (!) 85/76 99/84 103/85 95/81  Pulse: (!) 59 60 (!) 59 62  Resp: 16  14 17   Temp: 98.4 F (36.9 C) 98.3 F (36.8 C) 99 F (37.2 C) 98 F (36.7 C)  TempSrc: Oral Oral Oral Oral   SpO2: 97%  97% 94%  Weight:    88.3 kg (194 lb 10.7 oz)  Height:       I/Os: I/O last 3 completed shifts: In: 265 [P.O.:240; I.V.:25] Out: 2500 [Urine:2000; Other:500]  Physical Exam:  General: Patient is in no apparent distress Lungs: Normal respiratory effort, chest expands symmetrically. GI:The abdomen is soft and nontender without mass. Foley: draining clear yellow urine  Ext: lower extremities symmetric  Lab Results: Recent Labs    12/10/17 0430 12/11/17 0355 12/12/17 0624  WBC 8.7 6.4 5.8  HGB 8.9* 9.5* 9.4*  HCT 28.3* 30.0* 30.2*   Recent Labs    12/10/17 0430 12/11/17 0827 12/12/17 0624  NA 135 134* 137  K 4.5 4.5 4.6  CL 101 97* 100*  CO2 20* 24 25  GLUCOSE 126* 145* 113*  BUN 65* 35* 36*  CREATININE 6.25* 3.15* 2.80*  CALCIUM 8.3* 8.1* 8.2*   Recent Labs    12/10/17 0430 12/11/17 0355 12/12/17 0624  INR 2.35 1.85 1.54   No results for input(s): LABURIN in the last 72 hours. Results for orders placed or performed during the hospital encounter of 11/29/17  MRSA PCR Screening     Status: None   Collection Time: 11/29/17  5:47 PM  Result Value Ref Range Status   MRSA by PCR NEGATIVE NEGATIVE Final    Comment:        The GeneXpert MRSA Assay (FDA approved for NASAL specimens only), is one component of a comprehensive MRSA colonization surveillance program. It is not intended to diagnose MRSA infection nor to guide or monitor treatment for MRSA infections.   Culture, Urine     Status: Abnormal   Collection Time: 12/02/17  1:30 PM  Result Value Ref Range Status   Specimen Description URINE, CLEAN CATCH  Final   Special Requests NONE  Final   Culture <10,000 COLONIES/mL INSIGNIFICANT GROWTH (A)  Final   Report Status 12/03/2017 FINAL  Final   *Note: Due to a large number of results and/or encounters for the requested time period, some results have not been displayed. A complete set of results can be found in Results Review.     Studies/Results: Ir Fluoro Guide Cv Line Right  Result Date: 12/10/2017 CLINICAL DATA:  Progressive renal failure requiring hemodialysis. Request has been made to place a temporary non tunneled  dialysis catheter. EXAM: NON-TUNNELED CENTRAL VENOUS CATHETER PLACEMENT WITH ULTRASOUND AND FLUOROSCOPIC GUIDANCE FLUOROSCOPY TIME:  18 seconds.  9.2 mGy. PROCEDURE: The procedure, risks, benefits, and alternatives were explained to the patient. Questions regarding the procedure were encouraged and answered. The patient understands and consents to the procedure. A time-out was performed prior to initiating the procedure. Ultrasound was used to confirm patency of the right internal jugular vein. The right neck and chest were prepped with chlorhexidine in a sterile fashion, and a sterile drape was applied covering the operative field. Maximum barrier sterile technique with sterile gowns and gloves were used for the procedure. Local anesthesia was provided with 1% lidocaine. After creating a small venotomy incision, a 21 gauge needle was advanced into the right internal jugular vein under direct, real-time ultrasound guidance. Ultrasound image documentation was performed. Venous access was dilated over a guidewire. A 20 cm, triple lumen 12 Fr [place]kar non tunneled hemodialysis catheter was placed over a guidewire. Final catheter positioning was confirmed and documented with a fluoroscopic spot image. The catheter was aspirated, flushed with saline, and injected with appropriate volume heparin dwells. The catheter exit site was secured with a 0-Prolene retention suture. COMPLICATIONS: None.  No pneumothorax. FINDINGS: After catheter placement, the tip lies at the cavoatrial junction. The catheter aspirates normally and is ready for immediate use. IMPRESSION: Placement of non-tunneled central venous hemodialysis catheter via the right internal jugular vein. The catheter tip lies at the cavoatrial junction. The catheter is  ready for immediate use. Electronically Signed   By: Aletta Edouard M.D.   On: 12/10/2017 16:41   Ir US Guide Vasc Access Right  Result Date: 12/10/2017 CLINICAL DATA:  Progressive renal failure requiring hemodialysis. Request has been made to place a temporary non tunneled dialysis catheter. EXAM: NON-TUNNELED CENTRAL VENOUS CATHETER PLACEMENT WITH ULTRASOUND AND FLUOROSCOPIC GUIDANCE FLUOROSCOPY TIME:  18 seconds.  9.2 mGy. PROCEDURE: The procedure, risks, benefits, and alternatives were explained to the patient. Questions regarding the procedure were encouraged and answered. The patient understands and consents to the procedure. A time-out was performed prior to initiating the procedure. Ultrasound was used to confirm patency of the right internal jugular vein. The right neck and chest were prepped with chlorhexidine in a sterile fashion, and a sterile drape was applied covering the operative field. Maximum barrier sterile technique with sterile gowns and gloves were used for the procedure. Local anesthesia was provided with 1% lidocaine. After creating a small venotomy incision, a 21 gauge needle was advanced into the right internal jugular vein under direct, real-time ultrasound guidance. Ultrasound image documentation was performed. Venous access was dilated over a guidewire. A 20 cm, triple lumen 12 Fr Mahurkar non tunneled hemodialysis catheter was placed over a guidewire. Final catheter positioning was confirmed and documented with a fluoroscopic spot image. The catheter was aspirated, flushed with saline, and injected with appropriate volume heparin dwells. The catheter exit site was secured with a 0-Prolene retention suture. COMPLICATIONS: None.  No pneumothorax. FINDINGS: After catheter placement, the tip lies at the cavoatrial junction. The catheter aspirates normally and is ready for immediate use. IMPRESSION: Placement of non-tunneled central venous hemodialysis catheter via the right internal  jugular vein. The catheter tip lies at the cavoatrial junction. The catheter is ready for immediate use. Electronically Signed   By: Aletta Edouard M.D.   On: 12/10/2017 16:41   Ct Renal Stone Study  Result Date: 12/11/2017 CLINICAL DATA:  Hydronephrosis history of trans urethral resection of bladder tumor EXAM: CT  ABDOMEN AND PELVIS WITHOUT CONTRAST TECHNIQUE: Multidetector CT imaging of the abdomen and pelvis was performed following the standard protocol without IV contrast. COMPARISON:  Ultrasound 12/10/2017, 12/08/2017, 12/06/2017 CT 12/03/2017, 10/18/2017 FINDINGS: Lower chest: Emphysema at the bilateral lung bases. Previously demonstrated left lower lobe pulmonary nodule not well appreciated on the current exam. Trace bilateral pleural effusion. Cardiomegaly with left ventricular assist device and cardiac pacing leads again visible. Artifact from the LVAD limits the study. Hepatobiliary: No focal liver abnormality is seen. Status post cholecystectomy. No biliary dilatation. Pancreas: Unremarkable. No pancreatic ductal dilatation or surrounding inflammatory changes. Spleen: Normal in size without focal abnormality. Adrenals/Urinary Tract: Adrenal glands are within normal limits. Bilateral renal cysts are again demonstrated, on the left with peripheral calcification. Mild left hydronephrosis and hydroureter. The previously noted ureteral stone on the comparison CT is no longer evident. Slight dilatation of right extrarenal pelvis without calyceal dilatation. Slight prominence of the right ureter, more so distally without stone identified. Thick-walled decompressed urinary bladder. Stomach/Bowel: Stomach is nonenlarged. No dilated small bowel. No colon wall thickening. Appendix not well seen. Sigmoid colon diverticula without acute inflammation. Vascular/Lymphatic: No significantly enlarged lymph nodes. 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 Probable renal artery grafts. Changes suggesting prior  aortofemoral bypass. Moderate aortic atherosclerosis. Reproductive: Negative Other: Edema and small amount fluid in the presacral region. Negative for free air. Musculoskeletal: Postsurgical changes with stabilization rod and fixating screws L3 and L5 with marked compression fracture of L4. IMPRESSION: 1. Mild left hydronephrosis and mild to moderate hydroureter. The previously noted ureteral stone is no longer visible. Minimal dilatation of right renal collecting system without significant caliceal dilatation/hydronephrosis. Prominence of the distal right ureter, no stone disease visualized. Foley catheter in the bladder which is thick-walled and decompressed. 2. Edema and fluid within the presacral region, new since prior study. 3. Small bilateral pleural effusions. Moderate emphysema at the bases 4. 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 Electronically Signed   By: Donavan Foil M.D.   On: 12/11/2017 00:18    Assessment: HG T1 UCC of bladder--manage outpatient, consider BCG Gross hematuria--resolved AKI with left hydronephrosis that improved on [place] but mildly persists on CT, no right hydro--improving. Will continue conservative management and monitor for continued improvement.    Procedure(s): CYSTOSCOPY WITH left RETROGRADE PYELOGRAM, LEFT URETEROSCOPY AND STENT PLACEMENT HOLMIUM LASER APPLICATION TRANSURETHRAL RESECTION OF BLADDER TUMOR (TURBT), 8 Days Post-Op  doing well.     Link Snuffer, MD Urology 12/12/2017, 7:45 AM

## 2017-12-12 NOTE — Progress Notes (Signed)
ANTICOAGULATION CONSULT NOTE - Follow Up Consult  Pharmacy Consult for Heparin and Warfarin Indication: LVAD  Allergies  Allergen Reactions  . Demerol [Meperidine] Other (See Comments)    Paralysis. Could only move eyes.     Patient Measurements: Height: 6' (182.9 cm) Weight: 194 lb 10.7 oz (88.3 kg) IBW/kg (Calculated) : 77.6  Vital Signs: Temp: 97.7 F (36.5 C) (02/07 1950) Temp Source: Oral (02/07 1950) BP: 115/90 (02/07 2000) Pulse Rate: 59 (02/07 1950)  Labs: Recent Labs    12/10/17 0430 12/11/17 0355 12/11/17 0827 12/12/17 0624 12/12/17 1951  HGB 8.9* 9.5*  --  9.4*  --   HCT 28.3* 30.0*  --  30.2*  --   PLT 182 190  --  216  --   LABPROT 25.5* 21.2*  --  18.4*  --   INR 2.35 1.85  --  1.54  --   HEPARINUNFRC  --   --   --   --  0.45  CREATININE 6.25*  --  3.15* 2.80*  --     Estimated Creatinine Clearance: 23.9 mL/min (A) (by C-G formula based on SCr of 2.8 mg/dL (H)).  Assessment: [name] with LVAD on warfarin at home admitted for RHC and ureteroscopy. INR 2.2 on admit, warfarin held, and he was started on heparin once INR < 2.0. S/P cystoscopy + ureteroscopy with stent placement 1/30. Warfarin resumed 1/31. "Low dose" heparin resumed 2/1.   S/p LVAD interrogation. Restarting heparin bridge. Will aim for a lower heparin goal in the setting of recent bladder bleeding. CBC stable.   Home Warfarin Dose: 4mg  Tues/2mg  all other days  Patient HL at goal (0.45) on 1500 units/hr.  Per Dr. Haroldine Laws, drop rate to 1,000 units/hr in setting of hematuria.  Goal of Therapy:  Heparin level 0.3-0.5 INR 2-2.5 Monitor platelets by anticoagulation protocol: Yes   Plan:  1) Heparin at 1000 units/hr 2) Warfarin 4mg  tonight 3) Daily heparin level, INR, CBC  Angus Seller, PharmD Pharmacy Resident Phone: [phone] 12/12/2017 8:26 PM

## 2017-12-12 NOTE — Progress Notes (Signed)
ANTICOAGULATION CONSULT NOTE - Follow Up Consult  Pharmacy Consult for Heparin and Warfarin Indication: LVAD  Allergies  Allergen Reactions  . Demerol [Meperidine] Other (See Comments)    Paralysis. Could only move eyes.     Patient Measurements: Height: 6' (182.9 cm) Weight: 194 lb 10.7 oz (88.3 kg) IBW/kg (Calculated) : 77.6  Vital Signs: Temp: 97.9 F (36.6 C) (02/07 0811) Temp Source: Oral (02/07 0811) BP: 97/85 (02/07 0811) Pulse Rate: 65 (02/07 0811)  Labs: Recent Labs    12/10/17 0430 12/11/17 0355 12/11/17 0827 12/12/17 0624  HGB 8.9* 9.5*  --  9.4*  HCT 28.3* 30.0*  --  30.2*  PLT 182 190  --  216  LABPROT 25.5* 21.2*  --  18.4*  INR 2.35 1.85  --  1.54  CREATININE 6.25*  --  3.15* 2.80*    Estimated Creatinine Clearance: 23.9 mL/min (A) (by C-G formula based on SCr of 2.8 mg/dL (H)).  Assessment: 1 yoM with LVAD on warfarin at home admitted for RHC and ureteroscopy. INR 2.2 on admit, warfarin held, and he was started on heparin once INR < 2.0. S/P cystoscopy + ureteroscopy with stent placement 1/30. Warfarin resumed 1/31. "Low dose" heparin resumed 2/1.   Anticoagulation held 2/4 for HD catheter placement. Underwent HD x 1 on 2/5. SCr improving. and no further HD planned. No further interventions planned per urology either. Urine now clear. INR down to 1.5 and pharmacy asked to resume warfarin with heparin bridge. Will aim for a lower heparin level in the setting of recent bladder bleeding. CBC stable. LDH [id number].  Home Warfarin Dose: 4mg  Tues/2mg  all other days  Goal of Therapy:  Heparin level 0.3-0.5 INR 2-2.5 Monitor platelets by anticoagulation protocol: Yes   Plan:  1) Resume heparin 1500 units/hr 2) Check 8 hour heparin level 3) Warfarin 4mg  tonight 4) Daily heparin level, INR, CBC  Nena Jordan, PharmD, BCPS  12/12/2017 1:33 PM

## 2017-12-12 NOTE — Evaluation (Signed)
Physical Therapy Evaluation Patient Details Name: [name] MRN: [id number] DOB: 04/14/1941 Today's Date: 12/12/2017   History of Present Illness  Pt adm with lt uretal calculus and possible bladder mass. 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 Stent was placed and resection performed. Post-op required HD. Currently monitoring for possible further HD. Pt PMH - LVAD, chf, cad, cva, arthritis  Clinical Impression  Pt presents to PT with decline in his baseline mobility status. Will follow pt for a few sessions to make sure he returns to his typical active baseline. Do not feel he will need PT after DC.    Follow Up Recommendations No PT follow up    Equipment Recommendations  None recommended by PT    Recommendations for Other Services       Precautions / Restrictions Precautions Precautions: Other (comment) Precaution Comments: LVAD Restrictions Weight Bearing Restrictions: No      Mobility  Bed Mobility               General bed mobility comments: Pt up in chair  Transfers Overall transfer level: Needs assistance Equipment used: None Transfers: Sit to/from Stand Sit to Stand: Supervision         General transfer comment: supervision for lines/safety  Ambulation/Gait Ambulation/Gait assistance: Supervision Ambulation Distance (Feet): 300 Feet Assistive device: (Pushed IV pole) Gait Pattern/deviations: Step-through pattern;Decreased stride length;Wide base of support Gait velocity: decr Gait velocity interpretation: Below normal speed for age/gender General Gait Details: Pt with slightly guarded, slower gait than pt's baseline  Stairs            Wheelchair Mobility    Modified Rankin (Stroke Patients Only)       Balance Overall balance assessment: Needs assistance Sitting-balance support: No upper extremity supported;Feet supported Sitting balance-Leahy Scale: Normal     Standing balance support: No upper extremity  supported;During functional activity Standing balance-Leahy Scale: Good                               Pertinent Vitals/Pain Pain Assessment: No/denies pain    Home Living Family/patient expects to be discharged to:: Private residence Living Arrangements: Spouse/significant other Available Help at Discharge: Family;Available PRN/intermittently Type of Home: House Home Access: Ramped entrance     Home Layout: Two level;Able to live on main level with bedroom/bathroom Home Equipment: Gilford Rile - 2 wheels;Walker - 4 wheels;Cane - single point;Bedside commode;Grab bars - toilet;Grab bars - tub/shower;Tub bench;Toilet riser      Prior Function Level of Independence: Independent               Hand Dominance   Dominant Hand: Right    Extremity/Trunk Assessment   Upper Extremity Assessment Upper Extremity Assessment: Overall WFL for tasks assessed    Lower Extremity Assessment Lower Extremity Assessment: Generalized weakness       Communication   Communication: No difficulties  Cognition Arousal/Alertness: Awake/alert Behavior During Therapy: WFL for tasks assessed/performed Overall Cognitive Status: Within Functional Limits for tasks assessed                                        General Comments      Exercises     Assessment/Plan    PT Assessment Patient needs continued PT services  PT Problem List Decreased strength;Decreased balance;Decreased mobility  PT Treatment Interventions Gait training;Functional mobility training;Therapeutic activities;Therapeutic exercise;Balance training;Patient/family education    PT Goals (Current goals can be found in the Care Plan section)  Acute Rehab PT Goals Patient Stated Goal: return home PT Goal Formulation: With patient Time For Goal Achievement: 12/19/17 Potential to Achieve Goals: Good    Frequency Min 2X/week   Barriers to discharge        Co-evaluation                AM-PAC PT "6 Clicks" Daily Activity  Outcome Measure Difficulty turning over in bed (including adjusting bedclothes, sheets and blankets)?: None Difficulty moving from lying on back to sitting on the side of the bed? : A Little Difficulty sitting down on and standing up from a chair with arms (e.g., wheelchair, bedside commode, etc,.)?: A Little Help needed moving to and from a bed to chair (including a wheelchair)?: A Little Help needed walking in hospital room?: A Little Help needed climbing 3-5 steps with a railing? : A Little 6 Click Score: 19    End of Session   Activity Tolerance: Patient tolerated treatment well Patient left: in chair;with call bell/phone within reach Nurse Communication: Mobility status PT Visit Diagnosis: Other abnormalities of gait and mobility (R26.89);Muscle weakness (generalized) (M62.81)    Time: 6295-2841 PT Time Calculation (min) (ACUTE ONLY): 15 min   Charges:   PT Evaluation $PT Eval Low Complexity: 1 Low     PT G CodesMarland Kitchen        [facility] PT [place] 12/12/2017, 5:04 PM

## 2017-12-13 LAB — CBC WITH DIFFERENTIAL/PLATELET
Basophils Absolute: 0 10*3/uL (ref 0.0–0.1)
Basophils Relative: 1 %
EOS ABS: 0.3 10*3/uL (ref 0.0–0.7)
Eosinophils Relative: 6 %
HCT: 29.3 % — ABNORMAL LOW (ref 39.0–52.0)
HEMOGLOBIN: 9 g/dL — AB (ref 13.0–17.0)
LYMPHS ABS: 0.8 10*3/uL (ref 0.7–4.0)
LYMPHS PCT: 14 %
MCH: 27.4 pg (ref 26.0–34.0)
MCHC: 30.7 g/dL (ref 30.0–36.0)
MCV: 89.3 fL (ref 78.0–100.0)
Monocytes Absolute: 0.8 10*3/uL (ref 0.1–1.0)
Monocytes Relative: 15 %
NEUTROS ABS: 3.6 10*3/uL (ref 1.7–7.7)
NEUTROS PCT: 64 %
Platelets: 226 10*3/uL (ref 150–400)
RBC: 3.28 MIL/uL — AB (ref 4.22–5.81)
RDW: 17.4 % — ABNORMAL HIGH (ref 11.5–15.5)
WBC: 5.5 10*3/uL (ref 4.0–10.5)

## 2017-12-13 LAB — BASIC METABOLIC PANEL
Anion gap: 11 (ref 5–15)
BUN: 33 mg/dL — AB (ref 6–20)
CHLORIDE: 99 mmol/L — AB (ref 101–111)
CO2: 25 mmol/L (ref 22–32)
Calcium: 8.3 mg/dL — ABNORMAL LOW (ref 8.9–10.3)
Creatinine, Ser: 2.39 mg/dL — ABNORMAL HIGH (ref 0.61–1.24)
GFR calc Af Amer: 28 mL/min — ABNORMAL LOW (ref >60)
GFR calc non Af Amer: 24 mL/min — ABNORMAL LOW (ref >60)
Glucose, Bld: 115 mg/dL — ABNORMAL HIGH (ref 65–99)
POTASSIUM: 4.6 mmol/L (ref 3.5–5.1)
SODIUM: 135 mmol/L (ref 135–145)

## 2017-12-13 LAB — HEPARIN LEVEL (UNFRACTIONATED): Heparin Unfractionated: <0.1 IU/mL — ABNORMAL LOW (ref 0.30–0.70)

## 2017-12-13 LAB — COOXEMETRY PANEL
Carboxyhemoglobin: 1.8 % — ABNORMAL HIGH (ref 0.5–1.5)
Methemoglobin: 1.2 % (ref 0.0–1.5)
O2 Saturation: 85.5 %
TOTAL HEMOGLOBIN: 9.8 g/dL — AB (ref 12.0–16.0)

## 2017-12-13 LAB — PROTIME-INR
INR: 1.45
Prothrombin Time: 17.5 seconds — ABNORMAL HIGH (ref 11.4–15.2)

## 2017-12-13 LAB — LACTATE DEHYDROGENASE: LDH: 320 U/L — ABNORMAL HIGH (ref 98–192)

## 2017-12-13 MED ORDER — HEPARIN (PORCINE) IN NACL 100-0.45 UNIT/ML-% IJ SOLN
500.0000 [IU]/h | INTRAMUSCULAR | Status: DC
  Filled 2017-12-13: qty 250

## 2017-12-13 MED ORDER — WARFARIN SODIUM 3 MG PO TABS
6.0000 mg | ORAL_TABLET | Freq: Once | ORAL | Status: AC
  Administered 2017-12-13: 6 mg via ORAL
  Filled 2017-12-13: qty 2

## 2017-12-13 NOTE — Progress Notes (Signed)
LVAD Coordinator Rounding Note:  Admitted 11/29/17 due for elective ureteroscopy due to right ureteral stone. RHC showed low output due to RV failure.    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 Cyndia Bent with oversewn aortic valve under DT criteria.  Pt is post op day 9  from left ureteroscopy with laser lithotripsy, retrograde peylogram, left ureteral stent, and  resection of bladder tumor.    Vital signs: Temp: 98.5 HR: 75 Doppler Pressure:  90 Automatic BP:  103/84 (92) O2 Sat:  95% on RA   Weight: 194>191>196>197>196>197>199>200>197>195>195>194>196 lbs  GTTS: Milrinone 0.1 mcg/kg/min- stopped 12/10/17 Heparin - 12/06/17 off 12/08/17  LVAD interrogation reveals:  Speed: 8800 Flow:  4.0 Power:  4.5w PI: 7.8 Alarms: none Events:  none  Fixed speed: 8800 Low speed limit: 8200  Drive Line: Sorbaview dressing dry and intact; anchor intact and accurately applied. Weekly dressing changes; Last changed 12/08/17 by [name], his partner. Next change is due 12/15/17.  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 Started on Plavix 3/17>>Lumbar fusion L3-L5 9/17>>VT with DC-CV; amio increased 10/17>>ramp with RV failure; speed decreased to 8800 02/2017> GIB (removed ASA, scopes  negative) 03/2017>percutaneous lead repair after pump stops  10/18>>Slow VT. Cardioverted in ED and amiodarone uptitrated 12/18>>melena with scope. Hgb 6.5. EGD with 2 AVMs with APC 12/18>>recurrent hematuria. 6 mm left ureteral stone 1/19>>7 mm left ureteral stone removal with laser lithotripsy; left ureteral stent. Resection of 6 cm bladder tumor   Plan/Recommendations:   1. Please call VAD pager if any issues with VAD equipment or questions, concerns.  Tanda Rockers RN, VAD Coordinator 24/7 pager [phone]

## 2017-12-13 NOTE — Progress Notes (Signed)
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 Haroldine Laws presenting for hematuria and elective uteroscopy with need for management of his anticoagulation prior to surgery now with AKI on CKD 2/2 likely hemodynamic insult and obstruction.   AKI on CKD III: Likely 2/2 combined hemodynamic insult during surgery and acute L moderate hydronephrosis seen on [place] 2/3 (stent subsequently removed with clot present). CT with mild hydronephrosis. Cr improving, however with hematuria again today.  -daily RFPs -d/c HD cath 2/9 if continued progress  Bilateral hydronephrosis: per urology. Question need for repeat intervention, although appreciate tenuous surgical status given LVAD. CT read with mild hydronephrosis and hydroureter. Urology considering perc nephrostomy tube.  -per urology  Acute on Chronic biventricular systolic HF: managed by advanced HF team.  -per primary team  Recurrent renal stone with hematuria and UTI -per urology  Bladder tumor -per urology  LVAD per HF team  Subjective:   Hematuria again today. He states he feels well, but is concerned about the blood. O2 back on 2/2 desat to 85 per his report. He says he walked in the hall yesterday.    Objective:   BP 103/84 (BP Location: Left Arm)   Pulse 75   Temp 98.5 F (36.9 C) (Oral)   Resp 16   Ht 6' (1.829 m)   Wt 196 lb 8 oz (89.1 kg)   SpO2 90%   BMI 26.65 kg/m   Intake/Output Summary (Last 24 hours) at 12/13/2017 0858 Last data filed at 12/13/2017 0741 Gross per 24 hour  Intake 184.42 ml  Output 1690 ml  Net -1505.58 ml   Weight change: 1 lb 13.3 oz (0.832 kg)  Physical Exam: PTW:[facility] elderly male lying in bed in NAD CVS: LVAD with single mechanical heart sound.   Resp: CTAB easy WOB Abd: soft, Nontender Ext: no edema  Imaging: No results found.  Labs: BMET Recent Labs  Lab 12/07/17 0212 12/08/17 0505 12/09/17 0418 12/10/17 0430 12/11/17 0827 12/12/17 0624 12/13/17 0500  NA 132* 132* 134* 135 134* 137 135  K 4.6 4.5 4.3 4.5 4.5 4.6 4.6  CL 102 100* 103 101 97* 100* 99*  CO2 21* 21* 19* 20* 24 25 25   GLUCOSE 131* 123* 126* 126* 145* 113* 115*  BUN 27* 40* 49* 65* 35* 36* 33*  CREATININE 2.47* 3.92* 4.89* 6.25* 3.15* 2.80* 2.39*  CALCIUM 8.4* 8.2* 8.3* 8.3* 8.1* 8.2* 8.3*   CBC Recent Labs  Lab 12/10/17 0430 12/11/17 0355 12/12/17 0624 12/13/17 0500  WBC 8.7 6.4 5.8 5.5  NEUTROABS 6.2 4.4 3.6 3.6  HGB 8.9* 9.5* 9.4* 9.0*  HCT 28.3* 30.0* 30.2* 29.3*  MCV 88.2 87.7 89.6 89.3  PLT 182 190 216 226    Medications:    . amiodarone  200 mg Oral Daily  . atorvastatin  80 mg Oral q1800  . docusate sodium  200 mg Oral Daily  . levothyroxine  150 mcg Oral QAC breakfast  . mouth rinse  15 mL Mouth Rinse BID  . pantoprazole  40 mg Oral Daily  . ranolazine  500 mg Oral BID  . sildenafil  20 mg Oral TID  . sodium chloride flush  3 mL Intravenous Q12H  . tamsulosin  0.4 mg Oral Daily  .  Warfarin - Pharmacist Dosing Inpatient   Does not apply q1800    Ralene Ok, MD 12/13/17

## 2017-12-13 NOTE — Progress Notes (Signed)
ANTICOAGULATION CONSULT NOTE - Follow Up Consult  Pharmacy Consult for Heparin and Warfarin Indication: LVAD  Allergies  Allergen Reactions  . Demerol [Meperidine] Other (See Comments)    Paralysis. Could only move eyes.     Patient Measurements: Height: 6' (182.9 cm) Weight: 196 lb 8 oz (89.1 kg) IBW/kg (Calculated) : 77.6  Vital Signs: BP: 103/84 (02/08 0738) Pulse Rate: 75 (02/08 0738)  Labs: Recent Labs    12/11/17 0355 12/11/17 0827 12/12/17 0624 12/12/17 1951 12/13/17 0500 12/13/17 0536  HGB 9.5*  --  9.4*  --  9.0*  --   HCT 30.0*  --  30.2*  --  29.3*  --   PLT 190  --  216  --  226  --   LABPROT 21.2*  --  18.4*  --  17.5*  --   INR 1.85  --  1.54  --  1.45  --   HEPARINUNFRC  --   --   --  0.45  --  <0.10*  CREATININE  --  3.15* 2.80*  --  2.39*  --     Estimated Creatinine Clearance: 28 mL/min (A) (by C-G formula based on SCr of 2.39 mg/dL (H)).  Assessment: 13 yoM with LVAD on warfarin at home admitted for RHC and ureteroscopy. INR 2.2 on admit, warfarin held, and he was started on heparin once INR < 2.0. S/P cystoscopy + ureteroscopy with stent placement 1/30. Warfarin resumed 1/31. "Low dose" heparin resumed 2/1.   Anticoagulation held 2/4 for HD catheter placement. Underwent HD x 1 on 2/5. SCr improving. and no further HD planned. No further interventions planned per urology either. Heparin and warfarin resumed yesterday. Initial heparin level was therapeutic (0.45) on 1500 units/hr, however, patient developed hematuria. Dr. Haroldine Laws decreased the rate to 1000 units/hr. Follow up heparin level is < 0.10. With ongoing hematuria, he further decreased the rate to 500 units/hr today and wants to stop checking levels. INR 1.45. Hgb trending down to 9, LDH up to 320.  Home Warfarin Dose: 4mg  Tues/2mg  all other days  Goal of Therapy:  INR 2-2.5 Monitor platelets by anticoagulation protocol: Yes   Plan:  1) Heparin @ 500 units/hr per Dr. Haroldine Laws 2)  Increase warfarin to 6mg  tonight 3) Daily INR, CBC, LDH  Nena Jordan, PharmD, BCPS  12/13/2017 1:18 PM

## 2017-12-13 NOTE — Plan of Care (Signed)
Continue current plan of care.

## 2017-12-13 NOTE — Progress Notes (Signed)
Advanced Heart Failure VAD Team Note  Subjective:    Started Rocephin 12/03/17   CT Renal Stone. Stable L ureteral stone non obstructing. Increasing filling defect within the urinary bladder suspicious for an underlying mass. Although this may simply represent a hematoma,urologic consultation and direct visualization is recommended.  Cystocopy 12/04/17  - s/p Left ureteroscopy and stent placement.  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 Had lower abdominal pain so bladder irrigation started. Unfortunately he had a blockage so irrigation was stopped.  -2/5 temp HD cath placed. Had HD  Yesterday urine had cleared. INR was 1.5 so he was started back on  heparin drip and coumadin. Last night developed hematuria.   Complaining of fatigue. Denies SOB.   LDH 272>320 Hgb 9.4>9 Creatinine 2.8 >2.39   LVAD INTERROGATION:  HeartMate II LVAD:  Flow 3.8  liters/min, speed 8800 power 4  PI 8.2  Objective:    Vital Signs:   Temp:  [97.5 F (36.4 C)-98.5 F (36.9 C)] 98.5 F (36.9 C) (02/07 2356) Pulse Rate:  [59-75] 75 (02/08 0738) Resp:  [10-16] 16 (02/08 0738) BP: (97-115)/(81-91) 103/84 (02/08 0738) SpO2:  [90 %-98 %] 90 % (02/08 0738) Weight:  [196 lb 8 oz (89.1 kg)] 196 lb 8 oz (89.1 kg) (02/08 0536) Last BM Date: 12/11/17 Mean arterial Pressure Maps 70-80s  Intake/Output:   Intake/Output Summary (Last 24 hours) at 12/13/2017 0941 Last data filed at 12/13/2017 0741 Gross per 24 hour  Intake 184.42 ml  Output 1690 ml  Net -1505.58 ml     Physical Exam    Physical Exam: GENERAL: NAD  HEENT: normal  NECK: Supple, JVP 6-7  .  2+ bilaterally, no bruits.  No lymphadenopathy or thyromegaly appreciated. RIJ HD catheter  CARDIAC:  Mechanical heart sounds with LVAD hum present.  LUNGS:  Clear to auscultation bilaterally.  ABDOMEN:  Soft, round, nontender, positive bowel sounds x4.     LVAD exit site: well-healed and  incorporated.  Dressing dry and intact.  No erythema or drainage.  Stabilization device present and accurately applied.  Driveline dressing is being changed daily per sterile technique. EXTREMITIES:  Warm and dry, no cyanosis, clubbing, rash or edema  NEUROLOGIC:  Alert and oriented x 4.  Gait steady.  No aphasia.  No dysarthria.  Affect pleasant.    GU: foley hematuria      Telemetry  NSR 60-70s personally reviewed.    EKG    No new tracings.    Labs   Basic Metabolic Panel: Recent Labs  Lab 12/09/17 0418 12/10/17 0430 12/11/17 0827 12/12/17 0624 12/13/17 0500  NA 134* 135 134* 137 135  K 4.3 4.5 4.5 4.6 4.6  CL 103 101 97* 100* 99*  CO2 19* 20* 24 25 25   GLUCOSE 126* 126* 145* 113* 115*  BUN 49* 65* 35* 36* 33*  CREATININE 4.89* 6.25* 3.15* 2.80* 2.39*  CALCIUM 8.3* 8.3* 8.1* 8.2* 8.3*    Liver Function Tests: No results for input(s): AST, ALT, ALKPHOS, BILITOT, PROT, ALBUMIN in the last 168 hours. No results for input(s): LIPASE, AMYLASE in the last 168 hours. No results for input(s): AMMONIA in the last 168 hours.  CBC: Recent Labs  Lab 12/09/17 0418 12/10/17 0430 12/11/17 0355 12/12/17 0624 12/13/17 0500  WBC 7.6 8.7 6.4 5.8 5.5  NEUTROABS 5.4 6.2 4.4 3.6 3.6  HGB 9.2* 8.9* 9.5* 9.4* 9.0*  HCT 28.7* 28.3* 30.0* 30.2* 29.3*  MCV 88.3 88.2  87.7 89.6 89.3  PLT 163 182 190 216 226    INR: Recent Labs  Lab 12/09/17 0418 12/10/17 0430 12/11/17 0355 12/12/17 0624 12/13/17 0500  INR 2.11 2.35 1.85 1.54 1.45    Other results:     Imaging   No results found.   Medications:     Scheduled Medications: . amiodarone  200 mg Oral Daily  . atorvastatin  80 mg Oral q1800  . docusate sodium  200 mg Oral Daily  . levothyroxine  150 mcg Oral QAC breakfast  . mouth rinse  15 mL Mouth Rinse BID  . pantoprazole  40 mg Oral Daily  . ranolazine  500 mg Oral BID  . sildenafil  20 mg Oral TID  . sodium chloride flush  3 mL Intravenous Q12H  .  tamsulosin  0.4 mg Oral Daily  . Warfarin - Pharmacist Dosing Inpatient   Does not apply q1800    Infusions: . sodium chloride Stopped (12/04/17 1330)  . sodium chloride    . sodium chloride    . sodium chloride    . heparin 1,000 Units/hr (12/13/17 0519)  . lactated ringers 0 mL/hr at 12/04/17 1226    PRN Medications: sodium chloride, sodium chloride, sodium chloride, acetaminophen, alteplase, guaiFENesin, heparin, heparin, lidocaine (PF), lidocaine (PF), lidocaine-prilocaine, ondansetron (ZOFRAN) IV, pentafluoroprop-tetrafluoroeth, promethazine, sodium chloride flush, sodium chloride flush   VAD EVENTS   GU Event 09/01/14 had impacted ureteral stone and underwent cystoscopy and flexible ureteroscopy with lithotripsy and basket extraction.   09/2015 Hematuria and chronic nephrolithiasis. Renal US showed mild hyronephrosis and bilateral renal cysts. Dr. Risa Grill took back for repeat lithotripsy and stone extraction by ureteroscopy and J stent placement. 12/18 Recurrent hematuria. 24mm left ureteral stone. 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  4/2-18 - Episode of melena. Hgb 10.9-> 8.7. EGD/colonoscopy negative 12/18 - Melena with syncope  Hgb 6.5. Transfused 5u. 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  01/2016 R CEA. He was started on Plavix.  01/2016 Lumbar fusion L3-L5 with pedicle screws posterolateral arthrodesis with BMP, allograft  Patient Profile  [name] is a 77 y.o. male  with h/o VT, PAD and severe systolic HF (EF 71-69%) due to mixed ischemic/nonischemic CM he is s/p HM II LVAD implant for destination therapy on October 19, 2013.VAD implantation was complicated by VT, syncope, AF/Aflutter and RHF. Required short term milrinone.   Admitted 1/25 for elective ureteroscopy due to R ureteral stone.  Still with occasional hematuria. Continues with fatigue.  RHC 1/25 showed low output due to RV failure (see below). Milrinone initiated at 0.125.  Assessment/Plan:    1. 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 Struggling with progressive NYHA III symptoms.  RHC 1/25 showed primarily RHF with low output.  - No real symptomatic improvement with milrinone. Milrinone stopped 2/5  - Volume status stable. LDH trending up. Will need to watch closely.  - Continue Sildenafil 20 mg TID with RV failure.  - Continue VAD speed at 8800. VAD interrogated personally. Parameters stable. 2. Recurrent renal stone with ongoing hematuria and UTI: Left ureteral stone in 12/18. - Last dose of coumadin 1-24/2019 - 12/03/2017. CT with L stone and possible mass - Cystoscopy + Left Ureteroscopy with stent placed 12/04/17.  - Completed rocephin 12/03/17. Ucx negative.  - Stent removed by Dr Jeffie Pollock 2/3  -  U/s 2/5 with bilateral hydronephrosis -   CT on 2/5 with mild left hydronephrosis. Minimal dilitation on r without hydronephrosis.  Now with hematuria after starting heparin + coumadin.  3. AKI - Creatinine peaked 6.3.--ihd completed 2/5.  - Creatinine continues to trend down.  - Nephrology appreciated.  - Suspect combination of ATN versus obstruction. Nephrology & Urology following 4. Bladder Tumor - Seen on CT 1/29 and confirmed/resected on cystoscopy 12/04/17. - Pathology c/w high-grade urothelial CA. Discussed with Dr. Gloriann Loan. Plan outpatient BCG treatment. - As above. Needs further w/u for obstruction  5. LVAD:  - VAD interrogated personally. Parameters stable.  LDH trending up 272>320 INR down to 1.4. On Heparin. Discuss dosing with pharm D and Dr Haroldine Laws. .  .   6. H/o GIB: S/p clipping of 2 jejunal AVMs in 4/16. Continue coumadin and plavix (with h/o CVA). Admitted 4/18 with recurrent GIB. Transfused 2u. EGD/colon negative. Admitted 12/18, Hgb 6.5, Transfused 5u. EGD with 2 AVMs treated with APC - Hgb stable 9.4 -->9 7.  Atrial  arrhythmias: Paroxysmal atrial fibrillation, atrial tachycardia.  S/p DC-CV 06/04/14, DC-CV 03/02/15 and 01/19/16.  - remains in NSR.  - Continue amiodarone.  8. HTN: Stable.  9. 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 No further.  - Continue atorvastatin. (also warfarin INR goal 2-2.5). S/p R CEA 01/13/16.  - Plavix stopped 12/18 due to recurrent GIB 10. H/o amio-induced hypothyroidism: Amio restarted due to recurrent atrial arrhythmias and VT.  - Continue home levothyroxine dose. No change.  11. VT:  - Underwent DC-CV 9/17. Repeat episode of slow VT 08/17/17 requiring DC-CV.  - Continue amio 200 daily.  - Goal K > 4.0 Mg > 2.0. -Stable.      Length of Stay: [place], NP 12/13/2017, 9:41 AM  VAD Team --- VAD ISSUES ONLY--- Pager [phone] (7am - 7am)  Advanced Heart Failure Team  Pager [phone] (M-F; 7a - 4p)   Please contact [facility] for night-coverage after hours (4p -7a ) and weekends on [email or web address]  Patient seen and examined with Darrick Grinder, NP. We discussed all aspects of the encounter. I agree with the assessment and plan as stated above.   Feels weak today. Hematuria has restarted after restarting heparin gtt. Will drop dose to 500u/hr and give extra coumadin today. If INR >= 1.8 we can stop heparin completely. Discussed dosing with PharmD personally. Urology has seen and hematuria cleared quickly with some irrigation so hopefully bleeding is slowing. Volume status looks good. Renal function improving. Can likely pull Trialysis cath in am. LDH up slightly - will need to follow. VAD interrogated personally. Parameters stable. No power spikes.   Glori Bickers, MD  5:50 PM

## 2017-12-13 NOTE — Progress Notes (Signed)
Urology Inpatient Progress Report  Acute on chronic systolic (congestive) heart failure (HCC) [I50.23]  Procedure(s): CYSTOSCOPY WITH left RETROGRADE PYELOGRAM, LEFT URETEROSCOPY AND STENT PLACEMENT HOLMIUM LASER APPLICATION TRANSURETHRAL RESECTION OF BLADDER TUMOR (TURBT)  9 Days Post-Op   Intv/Subj: Patient is without complaint. UOP improving as is Cr. Heparin started back yesterday and hematurioa returned. Was clear prior. Hgb 9 from 9.4. Bladder flushed clear after 100cc sterile water.  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 0.9 %  sodium chloride infusion  250 mL Intravenous PRN Bensimhon, Shaune Pascal, MD   Stopped at 12/04/17 1330  . 0.9 %  sodium chloride infusion   Intravenous Once Kyung Rudd, CRNA      . 0.9 %  sodium chloride infusion  100 mL Intravenous PRN Elmarie Shiley, MD      . 0.9 %  sodium chloride infusion  100 mL Intravenous PRN Elmarie Shiley, MD      . acetaminophen (TYLENOL) tablet 650 mg  650 mg Oral Q4H PRN Clegg, Amy D, NP   650 mg at 12/09/17 2350  . alteplase (CATHFLO ACTIVASE) injection 2 mg  2 mg Intracatheter Once PRN Elmarie Shiley, MD      . amiodarone (PACERONE) tablet 200 mg  200 mg Oral Daily Larey Dresser, MD   200 mg at 12/13/17 0913  . atorvastatin (LIPITOR) tablet 80 mg  80 mg Oral q1800 Larey Dresser, MD   80 mg at 12/12/17 1811  . docusate sodium (COLACE) capsule 200 mg  200 mg Oral Daily Larey Dresser, MD   200 mg at 12/13/17 0913  . guaiFENesin (ROBITUSSIN) 100 MG/5ML solution 100 mg  5 mL Oral Q4H PRN Bensimhon, Shaune Pascal, MD   100 mg at 12/08/17 2300  . heparin ADULT infusion 100 units/mL (25000 units/268mL sodium chloride 0.45%)  500 Units/hr Intravenous Continuous Bensimhon, Shaune Pascal, MD 5  mL/hr at 12/13/17 1027 500 Units/hr at 12/13/17 1027  . heparin injection 1,000 Units  1,000 Units Dialysis PRN Elmarie Shiley, MD      . heparin injection   Intravenous PRN Aletta Edouard, MD   2,800 Units at 12/10/17 1546  . lactated ringers infusion   Intravenous Continuous Effie Berkshire, MD 0 mL/hr at 12/04/17 1226    . levothyroxine (SYNTHROID, LEVOTHROID) tablet 150 mcg  150 mcg Oral QAC breakfast Einar Grad, RPH   150 mcg at 12/13/17 0932  . lidocaine (PF) (XYLOCAINE) 1 % injection 5 mL  5 mL Intradermal PRN Elmarie Shiley, MD      . lidocaine (PF) (XYLOCAINE) 1 % injection    PRN Aletta Edouard, MD   2 mL at 12/10/17 1545  . lidocaine-prilocaine (EMLA) cream 1 application  1 application Topical PRN Elmarie Shiley, MD      . MEDLINE mouth rinse  15 mL Mouth Rinse BID Bensimhon, Shaune Pascal, MD   15 mL at 12/13/17 0914  . ondansetron (ZOFRAN) injection 4 mg  4 mg Intravenous Q6H PRN Clegg, Amy D, NP   4 mg at 12/09/17 1513  . pantoprazole (PROTONIX) EC tablet 40 mg  40 mg Oral Daily Larey Dresser, MD   40 mg at 12/13/17 0914  . pentafluoroprop-tetrafluoroeth (GEBAUERS) aerosol 1 application  1 application Topical PRN  Elmarie Shiley, MD      . promethazine [facility]) injection 12.5 mg  12.5 mg Intravenous Q6H PRN Clegg, Amy D, NP   12.5 mg at 12/09/17 2350  . ranolazine (RANEXA) 12 hr tablet 500 mg  500 mg Oral BID Larey Dresser, MD   500 mg at 12/13/17 0913  . sildenafil (REVATIO) tablet 20 mg  20 mg Oral TID Bensimhon, Shaune Pascal, MD   20 mg at 12/13/17 0914  . sodium chloride flush (NS) 0.9 % injection 10-40 mL  10-40 mL Intracatheter PRN Bensimhon, Shaune Pascal, MD   10 mL at 12/12/17 2206  . sodium chloride flush (NS) 0.9 % injection 3 mL  3 mL Intravenous Q12H Bensimhon, Shaune Pascal, MD   3 mL at 12/13/17 0914  . sodium chloride flush (NS) 0.9 % injection 3 mL  3 mL Intravenous PRN Bensimhon, Shaune Pascal, MD      . tamsulosin (FLOMAX) capsule 0.4 mg  0.4 mg Oral Daily Larey Dresser, MD   0.4 mg  at 12/13/17 0914  . warfarin (COUMADIN) tablet 6 mg  6 mg Oral ONCE-1800 Otilio Miu, [place]      . Warfarin - Pharmacist Dosing Inpatient   Does not apply q1800 Otilio Miu, [facility]         Objective: Vital: Vitals:   12/13/17 0000 12/13/17 0431 12/13/17 0536 12/13/17 0738  BP: (!) 102/91 97/81  103/84  Pulse: 71 66  75  Resp: 14 11  16   Temp:      TempSrc:      SpO2: 94% 95%  90%  Weight:   89.1 kg (196 lb 8 oz)   Height:       I/Os: I/O last 3 completed shifts: In: 184.4 [I.V.:184.4] Out: 3015 [Urine:3015]  Physical Exam:  General: Patient is in no apparent distress Lungs: Normal respiratory effort, chest expands symmetrically. GI: The abdomen is soft and nontender without mass. Foley: 3 way foley in place draining dark red but thin urine that cleared after only 100cc with no clot return  Ext: lower extremities symmetric  Lab Results: Recent Labs    12/11/17 0355 12/12/17 0624 12/13/17 0500  WBC 6.4 5.8 5.5  HGB 9.5* 9.4* 9.0*  HCT 30.0* 30.2* 29.3*   Recent Labs    12/11/17 0827 12/12/17 0624 12/13/17 0500  NA 134* 137 135  K 4.5 4.6 4.6  CL 97* 100* 99*  CO2 24 25 25   GLUCOSE 145* 113* 115*  BUN 35* 36* 33*  CREATININE 3.15* 2.80* 2.39*  CALCIUM 8.1* 8.2* 8.3*   Recent Labs    12/11/17 0355 12/12/17 0624 12/13/17 0500  INR 1.85 1.54 1.45   No results for input(s): LABURIN in the last 72 hours. Results for orders placed or performed during the hospital encounter of 11/29/17  MRSA PCR Screening     Status: None   Collection Time: 11/29/17  5:47 PM  Result Value Ref Range Status   MRSA by PCR NEGATIVE NEGATIVE Final    Comment:        The GeneXpert MRSA Assay (FDA approved for NASAL specimens only), is one component of a comprehensive MRSA colonization surveillance program. It is not intended to diagnose MRSA infection nor to guide or monitor treatment for MRSA infections.   Culture, Urine     Status: Abnormal   Collection Time:  12/02/17  1:30 PM  Result Value Ref Range Status   Specimen Description URINE, CLEAN CATCH  Final  Special Requests NONE  Final   Culture <10,000 COLONIES/mL INSIGNIFICANT GROWTH (A)  Final   Report Status 12/03/2017 FINAL  Final   *Note: Due to a large number of results and/or encounters for the requested time period, some results have not been displayed. A complete set of results can be found in Results Review.    Studies/Results: No results found.  Assessment: 1. AKI with left hydronephrosis, improving.  Continue foley. UOP picking up and renal fx improving so will continue to observe. 2. Gross hematuria--initially resolved but returned yesterday after resuming heparin.  Recommend backing off and ? DC heparin given gross hematuria. Does not appear to be significant given outpuit cleared after minimal flushing. He did not tolerate continuous irrigation well before. Given the hematuria is not significant, will ask nursing to flush q4h qith sterile water or normal saline. Monitor for improvement after decreasing or stopping heparin 3. HG T1 UCC of bladder--outpatient management  Procedure(s): CYSTOSCOPY WITH left RETROGRADE PYELOGRAM, LEFT URETEROSCOPY AND STENT PLACEMENT HOLMIUM LASER APPLICATION TRANSURETHRAL RESECTION OF BLADDER TUMOR (TURBT), 9 Days Post-Op      Link Snuffer, MD Urology 12/13/2017, 1:38 PM

## 2017-12-14 ENCOUNTER — Inpatient Hospital Stay (HOSPITAL_COMMUNITY): Payer: Medicare Other

## 2017-12-14 LAB — CBC WITH DIFFERENTIAL/PLATELET
BASOS PCT: 0 %
Basophils Absolute: 0 10*3/uL (ref 0.0–0.1)
Eosinophils Absolute: 0.2 10*3/uL (ref 0.0–0.7)
Eosinophils Relative: 3 %
HEMATOCRIT: 28.9 % — AB (ref 39.0–52.0)
HEMOGLOBIN: 8.9 g/dL — AB (ref 13.0–17.0)
LYMPHS ABS: 0.5 10*3/uL — AB (ref 0.7–4.0)
LYMPHS PCT: 7 %
MCH: 27.7 pg (ref 26.0–34.0)
MCHC: 30.8 g/dL (ref 30.0–36.0)
MCV: 90 fL (ref 78.0–100.0)
MONO ABS: 1.2 10*3/uL — AB (ref 0.1–1.0)
MONOS PCT: 18 %
NEUTROS ABS: 4.9 10*3/uL (ref 1.7–7.7)
NEUTROS PCT: 72 %
Platelets: 216 10*3/uL (ref 150–400)
RBC: 3.21 MIL/uL — ABNORMAL LOW (ref 4.22–5.81)
RDW: 17.4 % — AB (ref 11.5–15.5)
WBC: 6.9 10*3/uL (ref 4.0–10.5)

## 2017-12-14 LAB — BASIC METABOLIC PANEL
Anion gap: 12 (ref 5–15)
BUN: 35 mg/dL — AB (ref 6–20)
CALCIUM: 8.3 mg/dL — AB (ref 8.9–10.3)
CHLORIDE: 99 mmol/L — AB (ref 101–111)
CO2: 23 mmol/L (ref 22–32)
CREATININE: 3.21 mg/dL — AB (ref 0.61–1.24)
GFR calc Af Amer: 20 mL/min — ABNORMAL LOW (ref >60)
GFR calc non Af Amer: 17 mL/min — ABNORMAL LOW (ref >60)
GLUCOSE: 138 mg/dL — AB (ref 65–99)
Potassium: 4.6 mmol/L (ref 3.5–5.1)
Sodium: 134 mmol/L — ABNORMAL LOW (ref 135–145)

## 2017-12-14 LAB — COOXEMETRY PANEL
Carboxyhemoglobin: 1.8 % — ABNORMAL HIGH (ref 0.5–1.5)
Methemoglobin: 1.5 % (ref 0.0–1.5)
O2 SAT: 87.8 %
TOTAL HEMOGLOBIN: 9 g/dL — AB (ref 12.0–16.0)

## 2017-12-14 LAB — PROTIME-INR
INR: 1.59
PROTHROMBIN TIME: 18.8 s — AB (ref 11.4–15.2)

## 2017-12-14 LAB — LACTATE DEHYDROGENASE: LDH: 275 U/L — ABNORMAL HIGH (ref 98–192)

## 2017-12-14 MED ORDER — WARFARIN SODIUM 3 MG PO TABS
6.0000 mg | ORAL_TABLET | Freq: Once | ORAL | Status: AC
  Administered 2017-12-14: 6 mg via ORAL
  Filled 2017-12-14: qty 2

## 2017-12-14 NOTE — Progress Notes (Signed)
Patient ID: [name], male   DOB: 1941/01/28, 77 y.o.   MRN: [id number]   Advanced Heart Failure VAD Team Note  Subjective:    CT Renal Stone (2/5): Stable L ureteral stone non obstructing. Increasing filling defect within the urinary bladder suspicious for an underlying mass. Although this may simply represent a hematoma,urologic consultation and direct visualization is recommended.  Cystocopy 12/04/17  - s/p Left ureteroscopy and stent placement.  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 Had lower abdominal pain so bladder irrigation started. Unfortunately he had a blockage so irrigation was stopped.  - 2/5 temp HD cath placed. Had HD  INR 1.6 today.  Urine pink-tinged, no gross hematuria.  Creatinine up 2.39 => 3.21.  UOP 1150 cc.   + Fatigue, no dyspnea.     LDH 272>320>275 Hgb 9.4>9>8.9 Creatinine 2.8 >2.39>3.21  LVAD INTERROGATION:  HeartMate II LVAD:  Flow 3.7  liters/min, speed 8800,  power 4.5,  PI 7.4.  3 PI events/24 hrs  Objective:    Vital Signs:   Temp:  [97.8 F (36.6 C)-99 F (37.2 C)] 98 F (36.7 C) (02/09 1112) Pulse Rate:  [60-67] 61 (02/09 1112) Resp:  [13-18] 13 (02/09 0744) BP: (88-130)/(73-115) 95/83 (02/09 1112) SpO2:  [94 %-98 %] 97 % (02/09 1112) Weight:  [196 lb 3.2 oz (89 kg)] 196 lb 3.2 oz (89 kg) (02/09 0357) Last BM Date: 12/13/17 Mean arterial Pressure 80s  Intake/Output:   Intake/Output Summary (Last 24 hours) at 12/14/2017 1128 Last data filed at 12/14/2017 0800 Gross per 24 hour  Intake 510 ml  Output 975 ml  Net -465 ml     Physical Exam    Physical Exam: GENERAL: Well appearing this am. NAD.  HEENT: Normal. NECK: Supple, JVP 7-8 cm. Carotids OK.  CARDIAC:  Mechanical heart sounds with LVAD hum present.  LUNGS:  CTAB, normal effort.  ABDOMEN:  NT, ND, no HSM. No bruits or masses. +BS  LVAD exit site: Well-healed and incorporated. Dressing dry and intact. No  erythema or drainage. Stabilization device present and accurately applied. Driveline dressing changed daily per sterile technique. EXTREMITIES:  Warm and dry. No cyanosis, clubbing, rash, or edema.  NEUROLOGIC:  Alert & oriented x 3. Cranial nerves grossly intact. Moves all 4 extremities w/o difficulty. Affect pleasant   GU: Foley with pink drainage  Telemetry  A-paced in 60s, personally reviewed.    EKG    No new tracings.    Labs   Basic Metabolic Panel: Recent Labs  Lab 12/10/17 0430 12/11/17 0827 12/12/17 0624 12/13/17 0500 12/14/17 0501  NA 135 134* 137 135 134*  K 4.5 4.5 4.6 4.6 4.6  CL 101 97* 100* 99* 99*  CO2 20* 24 25 25 23   GLUCOSE 126* 145* 113* 115* 138*  BUN 65* 35* 36* 33* 35*  CREATININE 6.25* 3.15* 2.80* 2.39* 3.21*  CALCIUM 8.3* 8.1* 8.2* 8.3* 8.3*    Liver Function Tests: No results for input(s): AST, ALT, ALKPHOS, BILITOT, PROT, ALBUMIN in the last 168 hours. No results for input(s): LIPASE, AMYLASE in the last 168 hours. No results for input(s): AMMONIA in the last 168 hours.  CBC: Recent Labs  Lab 12/10/17 0430 12/11/17 0355 12/12/17 0624 12/13/17 0500 12/14/17 0501  WBC 8.7 6.4 5.8 5.5 6.9  NEUTROABS 6.2 4.4 3.6 3.6 4.9  HGB 8.9* 9.5* 9.4* 9.0* 8.9*  HCT 28.3* 30.0* 30.2* 29.3* 28.9*  MCV 88.2 87.7 89.6 89.3 90.0  PLT 182 190 216 226 216    INR: Recent Labs  Lab 12/10/17 0430 12/11/17 0355 12/12/17 0624 12/13/17 0500 12/14/17 0501  INR 2.35 1.85 1.54 1.45 1.59    Other results:     Imaging   No results found.   Medications:     Scheduled Medications: . amiodarone  200 mg Oral Daily  . atorvastatin  80 mg Oral q1800  . docusate sodium  200 mg Oral Daily  . levothyroxine  150 mcg Oral QAC breakfast  . mouth rinse  15 mL Mouth Rinse BID  . pantoprazole  40 mg Oral Daily  . ranolazine  500 mg Oral BID  . sildenafil  20 mg Oral TID  . sodium chloride flush  3 mL Intravenous Q12H  . tamsulosin  0.4 mg Oral Daily    . Warfarin - Pharmacist Dosing Inpatient   Does not apply q1800    Infusions: . sodium chloride Stopped (12/04/17 1330)  . sodium chloride    . sodium chloride    . sodium chloride    . heparin 500 Units/hr (12/14/17 0800)  . lactated ringers 0 mL/hr at 12/04/17 1226    PRN Medications: sodium chloride, sodium chloride, sodium chloride, acetaminophen, alteplase, guaiFENesin, heparin, heparin, lidocaine (PF), lidocaine (PF), lidocaine-prilocaine, ondansetron (ZOFRAN) IV, pentafluoroprop-tetrafluoroeth, promethazine, sodium chloride flush, sodium chloride flush   VAD EVENTS   GU Event 09/01/14 had impacted ureteral stone and underwent cystoscopy and flexible ureteroscopy with lithotripsy and basket extraction.   09/2015 Hematuria and chronic nephrolithiasis. Renal US showed mild hyronephrosis and bilateral renal cysts. Dr. Risa Grill took back for repeat lithotripsy and stone extraction by ureteroscopy and J stent placement. 12/18 Recurrent hematuria. 51mm left ureteral stone. 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  4/2-18 - Episode of melena. Hgb 10.9-> 8.7. EGD/colonoscopy negative 12/18 - Melena with syncope  Hgb 6.5. Transfused 5u. 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  01/2016 R CEA. He was started on Plavix.  01/2016 Lumbar fusion L3-L5 with pedicle screws posterolateral arthrodesis with BMP, allograft  Patient Profile  [name] is a 77 y.o. male  with h/o VT, PAD and severe systolic HF (EF 16-10%) due to mixed ischemic/nonischemic CM he is s/p HM II LVAD implant for destination therapy on October 19, 2013.VAD implantation was complicated by VT, syncope, AF/Aflutter and RHF. Required short term milrinone.   Admitted 1/25 for elective ureteroscopy due to R ureteral stone.  Still with occasional hematuria. Continues with fatigue. RHC 1/25 showed low output due  to RV failure (see below). Milrinone initiated at 0.125.  Assessment/Plan:    1. 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 Struggling with progressive NYHA III symptoms.  [place] 1/25 showed primarily RHF with low output. No real symptomatic improvement with milrinone. Milrinone stopped 2/5. Volume status stable today, LDH lower. - Continue Sildenafil 20 mg TID with RV failure.  - Continue VAD speed at 8800. VAD interrogated personally. Parameters stable. 2. Recurrent renal stone with ongoing hematuria and UTI: Left ureteral stone in 12/18.  12/03/2017 CT with L stone and possible mass.  Cystoscopy + Left Ureteroscopy with stent placed 12/04/17. Completed rocephin 12/03/17. Ucx negative. Stent removed by Dr Jeffie Pollock 2/3.  Renal US 2/5 with bilateral hydronephrosis.  Stone protocol CT on 2/5 with mild left hydronephrosis, minimal dilitation on right without hydronephrosis.  Urine remains pink-tinged.  - With creatinine higher today, will get repeat renal protocol CT => ?resolving hydronephrosis versus need for nephrostomy tube.  3. AKI: Suspect due to relative hypotension + ureteral obstruction.  Had HD x 1 on 2/5.  Creatinine higher today, still with good UOP.  - As above, repeat renal stone protocol CT for ?ongoing hydronephrosis and need for nephrostomy tube.  4. Bladder Tumor:  Seen on CT 1/29 and confirmed/resected on cystoscopy 12/04/17.  Pathology c/w high-grade urothelial CA. Discussed with Dr. Gloriann Loan. Plan outpatient BCG treatment. - As above. Needs further w/u for obstruction  5. LVAD: VAD interrogated personally. Parameters stable. LDH lower today.  INR 1.6. He has history of CVA.   - Continue low dose heparin + warfarin until INR 1.8 or above then stop heparin.  No frank hematuria today.  6. H/o GIB: S/p clipping of 2 jejunal AVMs in 4/16.  Admitted 4/18 with recurrent GIB. Transfused 2u. EGD/colon negative. Admitted 12/18, Hgb 6.5, Transfused 5u. EGD with 2 AVMs treated with  APC - Hgb stable 9.4 -->9 --> 8.9.  7.  Atrial arrhythmias: Paroxysmal atrial fibrillation, atrial tachycardia.  S/p DC-CV 06/04/14, DC-CV 03/02/15 and 01/19/16.  He is a-paced currently.  - Continue amiodarone.  8. HTN:  BP stable.  9. 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  10. H/o amio-induced hypothyroidism: Amio restarted due to recurrent atrial arrhythmias and VT.  - Continue home levothyroxine dose. No change.  11. VT: Underwent DC-CV 9/17. Repeat episode of slow VT 08/17/17 requiring DC-CV.  - Continue amio 200 daily.   Length of Stay: Kinde, MD 12/14/2017, 11:28 AM  VAD Team --- VAD ISSUES ONLY--- Pager [phone] (7am - 7am)  Advanced Heart Failure Team  Pager [phone] (M-F; 7a - 4p)   Please contact [facility] for night-coverage after hours (4p -7a ) and weekends on [email or web address]

## 2017-12-14 NOTE — Progress Notes (Addendum)
Patient ID: [name], male   DOB: 24-May-1941, 77 y.o.   MRN: [id number] [facility] Progress Note   Assessment/ Plan:   1. 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 Overnight, slight downtrend of urine output and rise of creatinine noted. Would favor repeat stone protocol CT scan to evaluate for resolving hydronephrosis or need for percutaneous nephrostomy tube. Transiently required hemodialysis (single treatment) earlier this week for uremic symptoms. Appeared to have been showing renal recovery however creatinine higher over the past 24 hours. Will leave dialysis catheter in place at this time until consistent renal recovery is verified.  2. Bilateral hydronephrosis/bladder tumor (high-grade urothelial cancer): Ongoing close follow-up by urology and based on their previous notes, likely to require repeat imaging of the urinary tract now with creatinine rising again >48 hours after hemodialysis with corresponding decrease in urine output. Had some transient hematuria after heparin was restarted- bladder irrigation performed yesterday afternoon.  3. 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  4. Hyponatremia: Secondary to history of congestive heart failure and likely compounded by recent acute kidney injury/free water excretion defect. Continue to follow closely.  5. Anemia: Secondary to chronic kidney disease/chronic illness and compounded by recent hematuria-we'll continue to follow and avoid ESA at this time. Check iron studies with next labs.  Subjective:   Reports to be feeling well and had a single episode of nausea overnight. Denies any chest pain or shortness of breath.    Objective:   BP 94/75 (BP Location: Left Arm)   Pulse 61   Temp 98.1 F (36.7 C) (Oral)    Resp 13   Ht 6' (1.829 m)   Wt 89 kg (196 lb 3.2 oz)   SpO2 94%   BMI 26.61 kg/m   Intake/Output Summary (Last 24 hours) at 12/14/2017 0806 Last data filed at 12/14/2017 0800 Gross per 24 hour  Intake 270 ml  Output 975 ml  Net -705 ml   Weight change: -0.136 kg (-4.8 oz)  Physical Exam: Gen: Comfortably resting in bed, watching television CVS: Pulse regular rhythm, normal rate, mechanical heart sounds with LVAD hum Resp: Decreased breath sounds at the bases, no distinct rales or rhonchi. Right IJ temporary hemodialysis catheter in place Abd: Soft, flat, nontender Ext: No lower extremity edema noted.  Imaging: No results found.  Labs: BMET Recent Labs  Lab 12/08/17 0505 12/09/17 0418 12/10/17 0430 12/11/17 0827 12/12/17 0624 12/13/17 0500 12/14/17 0501  NA 132* 134* 135 134* 137 135 134*  K 4.5 4.3 4.5 4.5 4.6 4.6 4.6  CL 100* 103 101 97* 100* 99* 99*  CO2 21* 19* 20* 24 25 25 23   GLUCOSE 123* 126* 126* 145* 113* 115* 138*  BUN 40* 49* 65* 35* 36* 33* 35*  CREATININE 3.92* 4.89* 6.25* 3.15* 2.80* 2.39* 3.21*  CALCIUM 8.2* 8.3* 8.3* 8.1* 8.2* 8.3* 8.3*   CBC Recent Labs  Lab 12/11/17 0355 12/12/17 0624 12/13/17 0500 12/14/17 0501  WBC 6.4 5.8 5.5 6.9  NEUTROABS 4.4 3.6 3.6 4.9  HGB 9.5* 9.4* 9.0* 8.9*  HCT 30.0* 30.2* 29.3* 28.9*  MCV 87.7 89.6 89.3 90.0  PLT 190 216 226 216    Medications:    . amiodarone  200 mg Oral Daily  . atorvastatin  80 mg Oral q1800  . docusate sodium  200  mg Oral Daily  . levothyroxine  150 mcg Oral QAC breakfast  . mouth rinse  15 mL Mouth Rinse BID  . pantoprazole  40 mg Oral Daily  . ranolazine  500 mg Oral BID  . sildenafil  20 mg Oral TID  . sodium chloride flush  3 mL Intravenous Q12H  . tamsulosin  0.4 mg Oral Daily  . Warfarin - Pharmacist Dosing Inpatient   Does not apply [id number]   Elmarie Shiley, MD 12/14/2017, 8:06 AM

## 2017-12-14 NOTE — Progress Notes (Signed)
ANTICOAGULATION CONSULT NOTE - Follow Up Consult  Pharmacy Consult for Heparin and Warfarin Indication: LVAD  Allergies  Allergen Reactions  . Demerol [Meperidine] Other (See Comments)    Paralysis. Could only move eyes.     Patient Measurements: Height: 6' (182.9 cm) Weight: 196 lb 3.2 oz (89 kg) IBW/kg (Calculated) : 77.6  Vital Signs: Temp: 98.6 F (37 C) (02/09 1608) Temp Source: Oral (02/09 1608) BP: 116/88 (02/09 1608) Pulse Rate: 62 (02/09 1608)  Labs: Recent Labs    12/12/17 0624 12/12/17 1951 12/13/17 0500 12/13/17 0536 12/14/17 0501  HGB 9.4*  --  9.0*  --  8.9*  HCT 30.2*  --  29.3*  --  28.9*  PLT 216  --  226  --  216  LABPROT 18.4*  --  17.5*  --  18.8*  INR 1.54  --  1.45  --  1.59  HEPARINUNFRC  --  0.45  --  <0.10*  --   CREATININE 2.80*  --  2.39*  --  3.21*    Estimated Creatinine Clearance: 20.8 mL/min (A) (by C-G formula based on SCr of 3.21 mg/dL (H)).  Assessment: 59 yoM with LVAD on warfarin at home admitted for RHC and ureteroscopy. INR 2.2 on admit, warfarin held, and he was started on heparin once INR < 2.0. S/P cystoscopy + ureteroscopy with stent placement 1/30. Warfarin resumed 1/31. "Low dose" heparin resumed 2/1.   Anticoagulation held 2/4 for HD catheter placement. Underwent HD x 1 on 2/5. SCr improving. and no further HD planned. No further interventions planned per urology either. Heparin and warfarin resumed. Initial heparin level was therapeutic (0.45) on 1500 units/hr, however, patient developed hematuria. Dr. Haroldine Laws decreased the rate to 1000 units/hr. Follow up heparin level is < 0.10. With ongoing hematuria, heparin rate further decreased the rate to 500 units/hr and urine pink tinged but no frank blood. INR 1.6. Hgb low stable 9, LDH 320>275  Home Warfarin Dose: 4mg  Tues/2mg  all other days  Goal of Therapy:  INR 2-2.5 Monitor platelets by anticoagulation protocol: Yes   Plan:  1) Heparin @ 500 units/hr per Dr.  Haroldine Laws 2) Repeat warfarin to 6mg  tonight 3) Daily INR, CBC, LDH  Bonnita Nasuti Pharm.D. CPP, BCPS Clinical Pharmacist [phone] 12/14/2017 4:35 PM

## 2017-12-14 NOTE — Progress Notes (Signed)
Patient ID: [name], male   DOB: Feb 13, 1941, 77 y.o.   MRN: [id number]    Assessment: Gross hematuria: This is secondary to his TURBT and the need for anticoagulation.  Right now his Foley catheter is draining with no clots and was irrigated 2 times overnight with no clots or difficulty with irrigation.  The urine in his catheter tubing is pink.  Plan: Continue gentle irrigation.    Subjective: Patient reports no complaints this morning.  He indicates he is not having any suprapubic discomfort and had no difficulties throughout the night.  His Foley catheter remains indwelling and he is tolerating this.  Objective: Vital signs in last 24 hours: Temp:  [97.8 F (36.6 C)-99 F (37.2 C)] 98.1 F (36.7 C) (02/09 0744) Pulse Rate:  [60-67] 61 (02/09 0744) Resp:  [13-18] 13 (02/09 0744) BP: (88-130)/(73-115) 94/75 (02/09 0744) SpO2:  [94 %-98 %] 94 % (02/09 0744) Weight:  [89 kg (196 lb 3.2 oz)] 89 kg (196 lb 3.2 oz) (02/09 0357)A  Intake/Output from previous day: 02/08 0701 - 02/09 0700 In: 260 [I.V.:60] Out: 1150 [Urine:1150] Intake/Output this shift: Total I/O In: 10 [I.V.:10] Out: -   Past Medical History:  Diagnosis Date  . Arthritis   . Automatic implantable cardioverter-defibrillator in situ    a. s/p Medtronic Evera device serial [id number]    . Bradycardia   . CAD (coronary artery disease)    a. s/p MI 1982;  s/p DES to CFX 2011;  s/p NSTEMI 4/12 in setting of VTach;  cath 7/12:  LM ok, LAD mid 30-40%, Dx's with 40%, mCFX stent patent, prox to mid RCA occluded with R to R and L to R collats.  Medical Tx was continued  . Chronic systolic heart failure ([place])    a. s/p LVAD 10/2013 for destination therapy  . CKD (chronic kidney disease)   . CVD (cerebrovascular disease)    a. s/p L CEA 2008  . Cytopenia   . Dysrhythmia    AFIB  . History of GI bleed    a. on ASA- started on plavix during 10/2015 admission for CVA  . HLD (hyperlipidemia)   . HTN  (hypertension)   . Hypothyroidism   . Inappropriate therapy from implantable cardioverter-defibrillator    a. ATP for sinus tach SVT wavelet identified SVT but was no  passive (2014)  . Ischemic cardiomyopathy   . 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 Myocardial infarction ([place])    1992  . PAF (paroxysmal atrial fibrillation) ([place])   . PVD (peripheral vascular disease) ([place])    a. 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 Shortness of breath dyspnea    W/ EXERTION   . Sleep apnea    NO CPAP NOW  HE SENT IT BACK YRS AGO  . Stroke [facility])    a. 10/2015 admission   . Ventricular tachycardia ([place])    a. s/p AICD;   h/o ICD shock;  Amiodarone Rx. S/P ablation in 2012    Physical Exam:  Lungs - Normal respiratory effort, chest expands symmetrically.  Abdomen - Soft, non-tender & non-distended.  Lab Results: Recent Labs    12/12/17 0624 12/13/17 0500 12/14/17 0501  WBC 5.8 5.5 6.9  HGB 9.4* 9.0* 8.9*  HCT 30.2* 29.3* 28.9*   BMET Recent Labs  12/13/17 0500 12/14/17 0501  NA 135 134*  K 4.6 4.6  CL 99* 99*  CO2 25 23  GLUCOSE 115* 138*  BUN 33* 35*  CREATININE 2.39* 3.21*  CALCIUM 8.3* 8.3*   No results for input(s): LABURIN in the last 72 hours. Results for orders placed or performed during the hospital encounter of 11/29/17  MRSA PCR Screening     Status: None   Collection Time: 11/29/17  5:47 PM  Result Value Ref Range Status   MRSA by PCR NEGATIVE NEGATIVE Final    Comment:        The GeneXpert MRSA Assay (FDA approved for NASAL specimens only), is one component of a comprehensive MRSA colonization surveillance program. It is not intended to diagnose MRSA infection nor to guide or monitor treatment for MRSA infections.   Culture, Urine     Status: Abnormal   Collection Time: 12/02/17  1:30 PM  Result Value Ref Range Status   Specimen Description URINE, CLEAN  CATCH  Final   Special Requests NONE  Final   Culture <10,000 COLONIES/mL INSIGNIFICANT GROWTH (A)  Final   Report Status 12/03/2017 FINAL  Final   *Note: Due to a large number of results and/or encounters for the requested time period, some results have not been displayed. A complete set of results can be found in Results Review.    Studies/Results: No results found.    Spyridon Hornstein C 12/14/2017, 8:19 AM

## 2017-12-14 NOTE — Progress Notes (Signed)
This RN transported pt to CT on 2 liters, battery with emergency equipment and back without incident

## 2017-12-14 NOTE — Progress Notes (Signed)
LVAD Coordinator Rounding Note:  Admitted 11/29/17 due for elective ureteroscopy due to right ureteral stone. RHC showed low output due to RV failure.    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 Cyndia Bent with oversewn aortic valve under DT criteria.  Pt is post op day 10  from left ureteroscopy with laser lithotripsy, retrograde peylogram, left ureteral stent, and  resection of bladder tumor.   Pt is a bit down today since his CR has risen to 3.21. We will continue to support the pt emotional/mentally.    Vital signs: Temp: 99 HR: 67 Doppler Pressure:  90 Automatic BP:  130/115 (121) O2 Sat:  95% on RA   Weight: 194>191>196>197>196>197>199>200>197>195>195>194>196 lbs  GTTS: Milrinone 0.1 mcg/kg/min- stopped 12/10/17 Heparin 500 units/hr  LVAD interrogation reveals:  Speed: 8800 Flow:  3.7 Power:  4.5w PI: 7.7 Alarms: none Events:  none  Fixed speed: 8800 Low speed limit: 8200  Drive Line: Sorbaview dressing dry and intact; anchor intact and accurately applied. Weekly dressing changes; Last changed 12/08/17 by [name], his partner. Next change is due 12/15/17.  Labs:  LDH trend: 362>332>270>275>280>269>285>319>259>253>288>272>320>275  INR trend: 2.20>2.12>1.89>1.90>1.75>1.58>1.48>1.33>1.27>2.11>2.35>1.85>1.54>1.45>1.59  Creat trend: 1.46>1.33>2.21>2.46>6.25>2.8>2.39>3.21  Anticoagulation Plan: - INR Goal:  2.0 - 2.5 - ASA Dose: no ASA (removed 02/2017 due to GI bleed)  Device: -Dual Medtronic ICD -Therapies: on at 200 bpm - Last device check 10/02/17  Significant Events on VAD Support:  09/01/14>>ureteral stone with cystoscopy and flexible ureteroscopy with lithotripsy and basket extraction 02/2015 >>GIB with 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 Started on Plavix 3/17>>Lumbar fusion L3-L5 9/17>>VT with  DC-CV; amio increased 10/17>>ramp with RV failure; speed decreased to 8800 02/2017> GIB (removed ASA, scopes negative) 03/2017>percutaneous lead repair after pump stops  10/18>>Slow VT. Cardioverted in ED and amiodarone uptitrated 12/18>>melena with scope. Hgb 6.5. EGD with 2 AVMs with APC 12/18>>recurrent hematuria. 6 mm left ureteral stone 1/19>>7 mm left ureteral stone removal with laser lithotripsy; left ureteral stent. Resection of 6 cm bladder tumor   Plan/Recommendations:   1. Please call VAD pager if any issues with VAD equipment or questions, concerns.  Tanda Rockers RN, VAD Coordinator 24/7 pager [phone]

## 2017-12-15 LAB — CBC WITH DIFFERENTIAL/PLATELET
BASOS PCT: 1 %
Basophils Absolute: 0 10*3/uL (ref 0.0–0.1)
EOS ABS: 0.2 10*3/uL (ref 0.0–0.7)
EOS PCT: 4 %
HCT: 27.4 % — ABNORMAL LOW (ref 39.0–52.0)
HEMOGLOBIN: 8.4 g/dL — AB (ref 13.0–17.0)
Lymphocytes Relative: 18 %
Lymphs Abs: 0.8 10*3/uL (ref 0.7–4.0)
MCH: 27.4 pg (ref 26.0–34.0)
MCHC: 30.7 g/dL (ref 30.0–36.0)
MCV: 89.3 fL (ref 78.0–100.0)
MONO ABS: 0.7 10*3/uL (ref 0.1–1.0)
MONOS PCT: 15 %
NEUTROS PCT: 62 %
Neutro Abs: 3 10*3/uL (ref 1.7–7.7)
PLATELETS: 214 10*3/uL (ref 150–400)
RBC: 3.07 MIL/uL — ABNORMAL LOW (ref 4.22–5.81)
RDW: 17.6 % — AB (ref 11.5–15.5)
WBC: 4.7 10*3/uL (ref 4.0–10.5)

## 2017-12-15 LAB — COOXEMETRY PANEL
Carboxyhemoglobin: 1.6 % — ABNORMAL HIGH (ref 0.5–1.5)
Methemoglobin: 1.2 % (ref 0.0–1.5)
O2 SAT: 82.8 %
Total hemoglobin: 8.6 g/dL — ABNORMAL LOW (ref 12.0–16.0)

## 2017-12-15 LAB — BASIC METABOLIC PANEL
Anion gap: 11 (ref 5–15)
BUN: 37 mg/dL — ABNORMAL HIGH (ref 6–20)
CALCIUM: 8.3 mg/dL — AB (ref 8.9–10.3)
CO2: 24 mmol/L (ref 22–32)
Chloride: 102 mmol/L (ref 101–111)
Creatinine, Ser: 2.92 mg/dL — ABNORMAL HIGH (ref 0.61–1.24)
GFR calc non Af Amer: 19 mL/min — ABNORMAL LOW (ref >60)
GFR, EST AFRICAN AMERICAN: 22 mL/min — AB (ref >60)
Glucose, Bld: 114 mg/dL — ABNORMAL HIGH (ref 65–99)
Potassium: 4.4 mmol/L (ref 3.5–5.1)
SODIUM: 137 mmol/L (ref 135–145)

## 2017-12-15 LAB — LACTATE DEHYDROGENASE: LDH: 246 U/L — AB (ref 98–192)

## 2017-12-15 LAB — PROTIME-INR
INR: 2.06
PROTHROMBIN TIME: 23.1 s — AB (ref 11.4–15.2)

## 2017-12-15 MED ORDER — WARFARIN SODIUM 2 MG PO TABS
2.0000 mg | ORAL_TABLET | ORAL | Status: DC
  Administered 2017-12-15 – 2017-12-16 (×2): 2 mg via ORAL
  Filled 2017-12-15 (×2): qty 1

## 2017-12-15 MED ORDER — WARFARIN SODIUM 4 MG PO TABS: 4.0000 mg | ORAL_TABLET | ORAL | Status: DC

## 2017-12-15 NOTE — Progress Notes (Signed)
Patient ID: ROBBEN [name], male   DOB: 1941-05-26, 77 y.o.   MRN: [id number]    Assessment: Gross hematuria: This has now resolved.  His catheter will be removed.  Plan: DC Foley catheter   Subjective: Patient reports no urinary complaints.  He reports his urine has cleared.  Objective: Vital signs in last 24 hours: Temp:  [97.9 F (36.6 C)-98.6 F (37 C)] 97.9 F (36.6 C) (02/10 0744) Pulse Rate:  [57-65] 62 (02/10 0744) Resp:  [16-18] 16 (02/10 0344) BP: (87-116)/(75-92) 100/92 (02/10 0744) SpO2:  [92 %-100 %] 92 % (02/10 0744) Weight:  [88.6 kg (195 lb 6.4 oz)] 88.6 kg (195 lb 6.4 oz) (02/10 0344)A  Intake/Output from previous day: 02/09 0701 - 02/10 0700 In: 535 [P.O.:480; I.V.:55] Out: 1250 [Urine:1250] Intake/Output this shift: No intake/output data recorded.  Past Medical History:  Diagnosis Date  . Arthritis   . Automatic implantable cardioverter-defibrillator in situ    a. s/p Medtronic Evera device serial [id number]    . Bradycardia   . CAD (coronary artery disease)    a. s/p MI 1982;  s/p DES to CFX 2011;  s/p NSTEMI 4/12 in setting of VTach;  cath 7/12:  LM ok, LAD mid 30-40%, Dx's with 40%, mCFX stent patent, prox to mid RCA occluded with R to R and L to R collats.  Medical Tx was continued  . Chronic systolic heart failure ([place])    a. s/p LVAD 10/2013 for destination therapy  . CKD (chronic kidney disease)   . CVD (cerebrovascular disease)    a. s/p L CEA 2008  . Cytopenia   . Dysrhythmia    AFIB  . History of GI bleed    a. on ASA- started on plavix during 10/2015 admission for CVA  . HLD (hyperlipidemia)   . HTN (hypertension)   . Hypothyroidism   . Inappropriate therapy from implantable cardioverter-defibrillator    a. ATP for sinus tach SVT wavelet identified SVT but was no  passive (2014)  . Ischemic cardiomyopathy   . 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 L Ear   . Myocardial infarction ([place])    1992  . PAF (paroxysmal atrial fibrillation) ([place])    . PVD (peripheral vascular disease) ([place])    a. 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 Shortness of breath dyspnea    W/ EXERTION   . Sleep apnea    NO CPAP NOW  HE SENT IT BACK YRS AGO  . Stroke [facility])    a. 10/2015 admission   . Ventricular tachycardia ([place])    a. s/p AICD;   h/o ICD shock;  Amiodarone Rx. S/P ablation in 2012    Physical Exam:  GU: Normal phallus with Foley catheter indwelling.  His urine is now completely clear.  Lab Results: Recent Labs    12/13/17 0500 12/14/17 0501 12/15/17 0440  WBC 5.5 6.9 4.7  HGB 9.0* 8.9* 8.4*  HCT 29.3* 28.9* 27.4*   BMET Recent Labs    12/14/17 0501 12/15/17 0440  NA 134* 137  K 4.6 4.4  CL 99* 102  CO2 23 24  GLUCOSE 138* 114*  BUN 35* 37*  CREATININE 3.21* 2.92*  CALCIUM 8.3* 8.3*   No results for input(s): LABURIN in the last 72 hours. Results for orders placed or performed during the hospital encounter  of 11/29/17  MRSA PCR Screening     Status: None   Collection Time: 11/29/17  5:47 PM  Result Value Ref Range Status   MRSA by PCR NEGATIVE NEGATIVE Final    Comment:        The GeneXpert MRSA Assay (FDA approved for NASAL specimens only), is one component of a comprehensive MRSA colonization surveillance program. It is not intended to diagnose MRSA infection nor to guide or monitor treatment for MRSA infections.   Culture, Urine     Status: Abnormal   Collection Time: 12/02/17  1:30 PM  Result Value Ref Range Status   Specimen Description URINE, CLEAN CATCH  Final   Special Requests NONE  Final   Culture <10,000 COLONIES/mL INSIGNIFICANT GROWTH (A)  Final   Report Status 12/03/2017 FINAL  Final   *Note: Due to a large number of results and/or encounters for the requested time period, some results have not been displayed. A complete set of results can be found in Results Review.    Studies/Results: Ct  Renal Stone Study  Result Date: 12/14/2017 CLINICAL DATA:  Possible ischemic ATN due to relative hypotension and ureteral obstruction. Overnight, slight downtrend of urine output and rise of creatinine noted. Would favor repeat stone protocol CT scan to evaluate for resolving hydronephrosis or need for percutaneous nephrostomy tube. Transiently required hemodialysis (single treatment) earlier this week for uremic symptoms. Appeared to have been showing renal recovery however creatinine higher over the past 24 hours. 2. Bilateral hydronephrosis/bladder tumor (high-grade urothelial cancer): Had some transient hematuria after heparin was restarted- bladder irrigation performed yesterday afternoon. EXAM: CT ABDOMEN AND PELVIS WITHOUT CONTRAST TECHNIQUE: Multidetector CT imaging of the abdomen and pelvis was performed following the standard protocol without IV contrast. COMPARISON:  12/10/2017 FINDINGS: Lower chest: Centrilobular emphysema in the lung bases. LVAD device in the central LEFT upper abdomen Hepatobiliary: No focal hepatic lesions noncontrast exam. Pancreas: Pancreas is normal. No ductal dilatation. No pancreatic inflammation. Spleen: Normal spleen Adrenals/urinary tract: Adrenal glands normal. Mild LEFT hydronephrosis and hydroureter is similar to comparison exam. No obstructing lesion identified. Potential caliber change in the LEFT ureter at the level of the aortic bifurcation (image 48, series 6) with distal ureter collapse. No obstructing calculus is identified. No bladder calculi. Foley catheter within the collapsed bladder Mild hydroureter on the RIGHT slightly increased. No hydronephrosis on the RIGHT. Bilateral simple fluid renal cysts. Stomach/Bowel: Stomach, small bowel, appendix, and cecum are normal. The colon and rectosigmoid colon are normal. Vascular/Lymphatic: Abdominal aorta is normal caliber with atherosclerotic calcification. There is no retroperitoneal or periportal lymphadenopathy. No  pelvic lymphadenopathy. Abdominal aortic aneurysm repair noted Reproductive: Prostate normal volume Other: No free fluid. Musculoskeletal: No aggressive osseous lesion. IMPRESSION: 1. Minimal interval change. Mild hydronephrosis and hydroureter on the LEFT with mild caliber change in the LEFT ureter at the aortic bifurcation. No obstructing lesion identified. 2. Mild hydroureter on the RIGHT slightly increased. No hydronephrosis on the RIGHT. 3. Bladder collapsed around Foley catheter. 4. Centrilobular emphysema in the lung bases. LVAD device in the LEFT upper abdomen. Electronically Signed   By: Suzy Bouchard M.D.   On: 12/14/2017 17:01      Osborn Pullin C 12/15/2017, 8:00 AM

## 2017-12-15 NOTE — Progress Notes (Signed)
LVAD dressing changed by pt's friend [name]. Anchor applied

## 2017-12-15 NOTE — Progress Notes (Signed)
Patient ID: [name], male   DOB: [birth year], 77 y.o.   MRN: [id number]   Advanced Heart Failure VAD Team Note  Subjective:    CT Renal Stone (2/5): Stable L ureteral stone non obstructing. Increasing filling defect within the urinary bladder suspicious for an underlying mass. Although this may simply represent a hematoma,urologic consultation and direct visualization is recommended.  CT stone protocol (2/9): No progression of hydroureter/hydronephrosis.   Cystocopy 12/04/17  - s/p Left ureteroscopy and stent placement.  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 Had lower abdominal pain so bladder irrigation started. Unfortunately he had a blockage so irrigation was stopped.  - 2/5 temp HD cath placed. Had HD  INR 1.6 today.  Urine pink-tinged, no gross hematuria.  Creatinine up 2.39 => 3.2 => 2.92.  UOP 1250 cc.   Foley out today, urine now clear.  Off heparin gtt with INR 2.06.       Did not walk much yesterday, fatigued and does not like walking with foley.   LDH 272>320>275>246 Hgb 9.4>9>8.9>8.4 Creatinine 2.8 >2.39>3.21>2.92  LVAD INTERROGATION:  HeartMate II LVAD:  Flow 3.7  liters/min, speed 8800,  power 4.5,  PI 7.4.  3 PI events/24 hrs  Objective:    Vital Signs:   Temp:  [97.9 F (36.6 C)-98.6 F (37 C)] 97.9 F (36.6 C) (02/10 0744) Pulse Rate:  [57-65] 62 (02/10 0744) Resp:  [16-18] 16 (02/10 0344) BP: (87-116)/(75-88) 100/82 (02/10 0744) SpO2:  [92 %-100 %] 92 % (02/10 0744) Weight:  [195 lb 6.4 oz (88.6 kg)] 195 lb 6.4 oz (88.6 kg) (02/10 0344) Last BM Date: 12/14/17 Mean arterial Pressure 80s  Intake/Output:   Intake/Output Summary (Last 24 hours) at 12/15/2017 1135 Last data filed at 12/15/2017 1000 Gross per 24 hour  Intake 582.33 ml  Output 950 ml  Net -367.67 ml     Physical Exam    Physical Exam: GENERAL: Well appearing this am. NAD.  HEENT: Normal. NECK: Supple, JVP 7-8 cm.  Carotids OK.  CARDIAC:  Mechanical heart sounds with LVAD hum present.  LUNGS:  CTAB, normal effort.  ABDOMEN:  NT, ND, no HSM. No bruits or masses. +BS  LVAD exit site: Well-healed and incorporated. Dressing dry and intact. No erythema or drainage. Stabilization device present and accurately applied. Driveline dressing changed daily per sterile technique. EXTREMITIES:  Warm and dry. No cyanosis, clubbing, rash, or edema.  NEUROLOGIC:  Alert & oriented x 3. Cranial nerves grossly intact. Moves all 4 extremities w/o difficulty. Affect pleasant    Telemetry  A-paced in 60s, personally reviewed.    EKG    No new tracings.    Labs   Basic Metabolic Panel: Recent Labs  Lab 12/11/17 0827 12/12/17 0624 12/13/17 0500 12/14/17 0501 12/15/17 0440  NA 134* 137 135 134* 137  K 4.5 4.6 4.6 4.6 4.4  CL 97* 100* 99* 99* 102  CO2 24 25 25 23 24   GLUCOSE 145* 113* 115* 138* 114*  BUN 35* 36* 33* 35* 37*  CREATININE 3.15* 2.80* 2.39* 3.21* 2.92*  CALCIUM 8.1* 8.2* 8.3* 8.3* 8.3*    Liver Function Tests: No results for input(s): AST, ALT, ALKPHOS, BILITOT, PROT, ALBUMIN in the last 168 hours. No results for input(s): LIPASE, AMYLASE in the last 168 hours. No results for input(s): AMMONIA in the last 168 hours.  CBC: Recent Labs  Lab 12/11/17 0355 12/12/17 0624 12/13/17 0500 12/14/17 0501 12/15/17 0440  WBC  6.4 5.8 5.5 6.9 4.7  NEUTROABS 4.4 3.6 3.6 4.9 3.0  HGB 9.5* 9.4* 9.0* 8.9* 8.4*  HCT 30.0* 30.2* 29.3* 28.9* 27.4*  MCV 87.7 89.6 89.3 90.0 89.3  PLT 190 216 226 216 214    INR: Recent Labs  Lab 12/11/17 0355 12/12/17 0624 12/13/17 0500 12/14/17 0501 12/15/17 0440  INR 1.85 1.54 1.45 1.59 2.06    Other results:     Imaging   Ct Renal Stone Study  Result Date: 12/14/2017 CLINICAL DATA:  Possible ischemic ATN due to relative hypotension and ureteral obstruction. Overnight, slight downtrend of urine output and rise of creatinine noted. Would favor repeat stone  protocol CT scan to evaluate for resolving hydronephrosis or need for percutaneous nephrostomy tube. Transiently required hemodialysis (single treatment) earlier this week for uremic symptoms. Appeared to have been showing renal recovery however creatinine higher over the past 24 hours. 2. Bilateral hydronephrosis/bladder tumor (high-grade urothelial cancer): Had some transient hematuria after heparin was restarted- bladder irrigation performed yesterday afternoon. EXAM: CT ABDOMEN AND PELVIS WITHOUT CONTRAST TECHNIQUE: Multidetector CT imaging of the abdomen and pelvis was performed following the standard protocol without IV contrast. COMPARISON:  12/10/2017 FINDINGS: Lower chest: Centrilobular emphysema in the lung bases. LVAD device in the central LEFT upper abdomen Hepatobiliary: No focal hepatic lesions noncontrast exam. Pancreas: Pancreas is normal. No ductal dilatation. No pancreatic inflammation. Spleen: Normal spleen Adrenals/urinary tract: Adrenal glands normal. Mild LEFT hydronephrosis and hydroureter is similar to comparison exam. No obstructing lesion identified. Potential caliber change in the LEFT ureter at the level of the aortic bifurcation (image 48, series 6) with distal ureter collapse. No obstructing calculus is identified. No bladder calculi. Foley catheter within the collapsed bladder Mild hydroureter on the RIGHT slightly increased. No hydronephrosis on the RIGHT. Bilateral simple fluid renal cysts. Stomach/Bowel: Stomach, small bowel, appendix, and cecum are normal. The colon and rectosigmoid colon are normal. Vascular/Lymphatic: Abdominal aorta is normal caliber with atherosclerotic calcification. There is no retroperitoneal or periportal lymphadenopathy. No pelvic lymphadenopathy. Abdominal aortic aneurysm repair noted Reproductive: Prostate normal volume Other: No free fluid. Musculoskeletal: No aggressive osseous lesion. IMPRESSION: 1. Minimal interval change. Mild hydronephrosis and  hydroureter on the LEFT with mild caliber change in the LEFT ureter at the aortic bifurcation. No obstructing lesion identified. 2. Mild hydroureter on the RIGHT slightly increased. No hydronephrosis on the RIGHT. 3. Bladder collapsed around Foley catheter. 4. Centrilobular emphysema in the lung bases. LVAD device in the LEFT upper abdomen. Electronically Signed   By: Suzy Bouchard M.D.   On: 12/14/2017 17:01     Medications:     Scheduled Medications: . amiodarone  200 mg Oral Daily  . atorvastatin  80 mg Oral q1800  . docusate sodium  200 mg Oral Daily  . levothyroxine  150 mcg Oral QAC breakfast  . mouth rinse  15 mL Mouth Rinse BID  . pantoprazole  40 mg Oral Daily  . ranolazine  500 mg Oral BID  . sildenafil  20 mg Oral TID  . sodium chloride flush  3 mL Intravenous Q12H  . tamsulosin  0.4 mg Oral Daily  . Warfarin - Pharmacist Dosing Inpatient   Does not apply q1800    Infusions: . sodium chloride Stopped (12/04/17 1330)  . sodium chloride    . sodium chloride    . sodium chloride    . lactated ringers 0 mL/hr at 12/04/17 1226    PRN Medications: sodium chloride, sodium chloride, sodium chloride, acetaminophen, alteplase, guaiFENesin,  heparin, heparin, lidocaine (PF), lidocaine (PF), lidocaine-prilocaine, ondansetron (ZOFRAN) IV, pentafluoroprop-tetrafluoroeth, promethazine, sodium chloride flush, sodium chloride flush   VAD EVENTS   GU Event 09/01/14 had impacted ureteral stone and underwent cystoscopy and flexible ureteroscopy with lithotripsy and basket extraction.   09/2015 Hematuria and chronic nephrolithiasis. Renal US showed mild hyronephrosis and bilateral renal cysts. Dr. Risa Grill took back for repeat lithotripsy and stone extraction by ureteroscopy and J stent placement. 12/18 Recurrent hematuria. 3mm left ureteral stone. 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  4/2-18 - Episode of  melena. Hgb 10.9-> 8.7. EGD/colonoscopy negative 12/18 - Melena with syncope  Hgb 6.5. Transfused 5u. 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  01/2016 R CEA. He was started on Plavix.  01/2016 Lumbar fusion L3-L5 with pedicle screws posterolateral arthrodesis with BMP, allograft  Patient Profile  [name] is a 77 y.o. male  with h/o VT, PAD and severe systolic HF (EF 10-93%) due to mixed ischemic/nonischemic CM he is s/p HM II LVAD implant for destination therapy on October 19, 2013.VAD implantation was complicated by VT, syncope, AF/Aflutter and RHF. Required short term milrinone.   Admitted 1/25 for elective ureteroscopy due to R ureteral stone.  Still with occasional hematuria. Continues with fatigue. RHC 1/25 showed low output due to RV failure (see below). Milrinone initiated at 0.125.  Assessment/Plan:    1. 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 Struggling with progressive NYHA III symptoms. [place] 1/25 showed primarily RHF with low output. No real symptomatic improvement with milrinone. Milrinone stopped 2/5. Volume status stable today, LDH lower.  VAD interrogated personally. Parameters stable. - Continue Sildenafil 20 mg TID with RV failure.  - Continue VAD speed at 8800.  2. Recurrent renal stone with ongoing hematuria and UTI: Left ureteral stone in 12/18.  12/03/2017 CT with L stone and possible mass.  Cystoscopy + Left Ureteroscopy with stent placed 12/04/17. Completed rocephin 12/03/17. Ucx negative. Stent removed by Dr Jeffie Pollock 2/3.  Renal US 2/5 with bilateral hydronephrosis.  Stone protocol CT on 2/5 with mild left hydronephrosis, minimal dilitation on right without hydronephrosis.  Repeat stone protocol CT on 2/9 with no progression of hydronephrosis/hydroureter. Foley removed today, urine now clear.   3. AKI: Suspect due to relative hypotension + ureteral obstruction.  Had HD x 1 on 2/5.  Creatinine trending down  today, good UOP. As above, no progression of hydronephrosis on CT yesterday.  - Will leave in HD catheter today, possibly remove tomorrow.  4. Bladder Tumor:  Seen on CT 1/29 and confirmed/resected on cystoscopy 12/04/17.  Pathology c/w high-grade urothelial CA. Discussed with Dr. Gloriann Loan. Plan outpatient BCG treatment. - As above. Needs further w/u for obstruction  5. LVAD: VAD interrogated personally. Parameters stable. LDH lower today.  INR 2.04. He has history of CVA.   - He is now off heparin gtt and just on warfarin.  6. H/o GIB: S/p clipping of 2 jejunal AVMs in 4/16.  Admitted 4/18 with recurrent GIB. Transfused 2u. EGD/colon negative. Admitted 12/18, Hgb 6.5, Transfused 5u. EGD with 2 AVMs treated with APC - Hgb slow downtrend 9.4 -->9 --> 8.9 --> 8.4. 7.  Atrial arrhythmias: Paroxysmal atrial fibrillation, atrial tachycardia.  S/p DC-CV 06/04/14, DC-CV 03/02/15 and 01/19/16.  He is a-paced currently.  - Continue amiodarone.  8. HTN:  BP stable.  9.  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  10. H/o amio-induced hypothyroidism: Amio restarted due to recurrent atrial arrhythmias and VT.  - Continue home levothyroxine dose. No change.  11. VT: Underwent DC-CV 9/17. Repeat episode of slow VT 08/17/17 requiring DC-CV.  - Continue amio 200 daily.   Length of Stay: [place], MD 12/15/2017, 11:35 AM  VAD Team --- VAD ISSUES ONLY--- Pager [phone] (7am - 7am)  Advanced Heart Failure Team  Pager [phone] (M-F; 7a - 4p)   Please contact [facility] for night-coverage after hours (4p -7a ) and weekends on [email or web address]

## 2017-12-15 NOTE — Progress Notes (Signed)
Order received from Dr Karsten Ro to d/c foley, pt refuses to let me remove it until he speaks with Dr Karsten Ro first and ask questions. I paged Dr Karsten Ro, he stated he was on unit and saw pt this am. I let him speak with pt on the phone. Pt is agreeable to me taking out foley.

## 2017-12-15 NOTE — Progress Notes (Signed)
Patient ID: [name], male   DOB: Mar 30, 1941, 77 y.o.   MRN: [id number] [facility] Progress Note   Assessment/ Plan:   1. 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 Urine output fair with some improvement of creatinine noted overnight, CT scan did not show any progression of hydroureter/hydronephrosis to prompt a need for percutaneous nephrostomy. No indication for dialysis at this time after he received a single treatment earlier this week for uremic symptoms. Will leave dialysis catheter in place at this time until consistent renal recovery is verified.  2. Bilateral hydronephrosis/bladder tumor (high-grade urothelial cancer): Ongoing close follow-up by urology and plans noted for discontinuation of Foley catheter today-hematuria appears to have resolved (now off of heparin drip with an INR of 2.0).  3. 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  4. Hyponatremia: Secondary to history of congestive heart failure and likely compounded by recent acute kidney injury/free water excretion defect. Appears corrected at this time with improving urine output.  5. Anemia: Secondary to chronic kidney disease/chronic illness and compounded by recent hematuria-we'll continue to follow and avoid ESA at this time.   Subjective:   Reports no acute events overnight-denies any chest pain or shortness of breath.    Objective:   BP 100/82 (BP Location: Left Arm)   Pulse 62   Temp 97.9 F (36.6 C) (Oral)   Resp 16   Ht 6' (1.829 m)   Wt 88.6 kg (195 lb 6.4 oz)   SpO2 92%   BMI 26.50 kg/m   Intake/Output Summary (Last 24 hours) at 12/15/2017 0834 Last data filed at 12/15/2017 0740 Gross per 24 hour  Intake 358.33 ml  Output 1250 ml  Net -891.67 ml   Weight change: -0.363 kg  (-12.8 oz)  Physical Exam: Gen: Comfortably resting in bed CVS: Pulse regular rhythm, normal rate, mechanical heart sounds with LVAD hum Resp: Decreased breath sounds at the bases, no distinct rales or rhonchi. Right IJ temporary hemodialysis catheter in place Abd: Soft, flat, nontender Ext: No lower extremity edema noted.  Imaging: Ct Renal Stone Study  Result Date: 12/14/2017 CLINICAL DATA:  Possible ischemic ATN due to relative hypotension and ureteral obstruction. Overnight, slight downtrend of urine output and rise of creatinine noted. Would favor repeat stone protocol CT scan to evaluate for resolving hydronephrosis or need for percutaneous nephrostomy tube. Transiently required hemodialysis (single treatment) earlier this week for uremic symptoms. Appeared to have been showing renal recovery however creatinine higher over the past 24 hours. 2. Bilateral hydronephrosis/bladder tumor (high-grade urothelial cancer): Had some transient hematuria after heparin was restarted- bladder irrigation performed yesterday afternoon. EXAM: CT ABDOMEN AND PELVIS WITHOUT CONTRAST TECHNIQUE: Multidetector CT imaging of the abdomen and pelvis was performed following the standard protocol without IV contrast. COMPARISON:  12/10/2017 FINDINGS: Lower chest: Centrilobular emphysema in the lung bases. LVAD device in the central LEFT upper abdomen Hepatobiliary: No focal hepatic lesions noncontrast exam. Pancreas: Pancreas is normal. No ductal dilatation. No pancreatic inflammation. Spleen: Normal spleen Adrenals/urinary tract: Adrenal glands normal. Mild LEFT hydronephrosis and hydroureter is similar to comparison exam. No obstructing lesion identified. Potential caliber change in the LEFT ureter at the level of the aortic bifurcation (image 48, series 6) with distal ureter collapse. No obstructing calculus is identified. No bladder calculi. Foley catheter within the collapsed bladder  Mild hydroureter on the RIGHT  slightly increased. No hydronephrosis on the RIGHT. Bilateral simple fluid renal cysts. Stomach/Bowel: Stomach, small bowel, appendix, and cecum are normal. The colon and rectosigmoid colon are normal. Vascular/Lymphatic: Abdominal aorta is normal caliber with atherosclerotic calcification. There is no retroperitoneal or periportal lymphadenopathy. No pelvic lymphadenopathy. Abdominal aortic aneurysm repair noted Reproductive: Prostate normal volume Other: No free fluid. Musculoskeletal: No aggressive osseous lesion. IMPRESSION: 1. Minimal interval change. Mild hydronephrosis and hydroureter on the LEFT with mild caliber change in the LEFT ureter at the aortic bifurcation. No obstructing lesion identified. 2. Mild hydroureter on the RIGHT slightly increased. No hydronephrosis on the RIGHT. 3. Bladder collapsed around Foley catheter. 4. Centrilobular emphysema in the lung bases. LVAD device in the LEFT upper abdomen. Electronically Signed   By: Suzy Bouchard M.D.   On: 12/14/2017 17:01    Labs: BMET Recent Labs  Lab 12/09/17 0418 12/10/17 0430 12/11/17 0827 12/12/17 0624 12/13/17 0500 12/14/17 0501 12/15/17 0440  NA 134* 135 134* 137 135 134* 137  K 4.3 4.5 4.5 4.6 4.6 4.6 4.4  CL 103 101 97* 100* 99* 99* 102  CO2 19* 20* 24 25 25 23 24   GLUCOSE 126* 126* 145* 113* 115* 138* 114*  BUN 49* 65* 35* 36* 33* 35* 37*  CREATININE 4.89* 6.25* 3.15* 2.80* 2.39* 3.21* 2.92*  CALCIUM 8.3* 8.3* 8.1* 8.2* 8.3* 8.3* 8.3*   CBC Recent Labs  Lab 12/12/17 0624 12/13/17 0500 12/14/17 0501 12/15/17 0440  WBC 5.8 5.5 6.9 4.7  NEUTROABS 3.6 3.6 4.9 3.0  HGB 9.4* 9.0* 8.9* 8.4*  HCT 30.2* 29.3* 28.9* 27.4*  MCV 89.6 89.3 90.0 89.3  PLT 216 226 216 214    Medications:    . amiodarone  200 mg Oral Daily  . atorvastatin  80 mg Oral q1800  . docusate sodium  200 mg Oral Daily  . levothyroxine  150 mcg Oral QAC breakfast  . mouth rinse  15 mL Mouth Rinse BID  . pantoprazole  40 mg Oral Daily  .  ranolazine  500 mg Oral BID  . sildenafil  20 mg Oral TID  . sodium chloride flush  3 mL Intravenous Q12H  . tamsulosin  0.4 mg Oral Daily  . Warfarin - Pharmacist Dosing Inpatient   Does not apply [id number]   Elmarie Shiley, MD 12/15/2017, 8:34 AM

## 2017-12-15 NOTE — Progress Notes (Signed)
ANTICOAGULATION CONSULT NOTE - Follow Up Consult  Pharmacy Consult for Heparin and Warfarin Indication: LVAD  Allergies  Allergen Reactions  . Demerol [Meperidine] Other (See Comments)    Paralysis. Could only move eyes.     Patient Measurements: Height: 6' (182.9 cm) Weight: 195 lb 6.4 oz (88.6 kg) IBW/kg (Calculated) : 77.6  Vital Signs: Temp: 97.8 F (36.6 C) (02/10 1246) Temp Source: Oral (02/10 1246) BP: 94/81 (02/10 1246) Pulse Rate: 63 (02/10 1246)  Labs: Recent Labs    12/12/17 1951  12/13/17 0500 12/13/17 0536 12/14/17 0501 12/15/17 0440  HGB  --    < > 9.0*  --  8.9* 8.4*  HCT  --   --  29.3*  --  28.9* 27.4*  PLT  --   --  226  --  216 214  LABPROT  --   --  17.5*  --  18.8* 23.1*  INR  --   --  1.45  --  1.59 2.06  HEPARINUNFRC 0.45  --   --  <0.10*  --   --   CREATININE  --   --  2.39*  --  3.21* 2.92*   < > = values in this interval not displayed.    Estimated Creatinine Clearance: 22.9 mL/min (A) (by C-G formula based on SCr of 2.92 mg/dL (H)).  Assessment: 50 yoM with LVAD on warfarin at home admitted for RHC and ureteroscopy. INR 2.2 on admit, warfarin held, and he was started on heparin once INR < 2.0. S/P cystoscopy + ureteroscopy with stent placement 1/30. Warfarin resumed 1/31. "Low dose" heparin resumed 2/1.   Anticoagulation held 2/4 for HD catheter placement. Underwent HD x 1 on 2/5. SCr improving. and no further HD planned. No further interventions planned per urology either. Heparin and warfarin resumed. Initial heparin level was therapeutic (0.45) on 1500 units/hr, however, patient developed hematuria, heparin decreased the rate to 1000 units/hr, ongoing hematuria, heparin rate further decreased the rate to 500 units/hr and urine pink tinged but no frank blood> now cleared 2/10 and INR up 2.06  Hgb low stable 8.4 watch LDH 300s>200s  Home Warfarin Dose: 4mg  Tues/2mg  all other days  Goal of Therapy:  INR 2-2.5 Monitor platelets by  anticoagulation protocol: Yes   Plan:  1) stop heparin 2) restart home dose warfarin 2mg  all days except 4mg  Tues 3) Daily INR, CBC, LDH  Bonnita Nasuti Pharm.D. CPP, BCPS Clinical Pharmacist [phone] 12/15/2017 2:19 PM

## 2017-12-16 LAB — BASIC METABOLIC PANEL
ANION GAP: 10 (ref 5–15)
BUN: 36 mg/dL — ABNORMAL HIGH (ref 6–20)
CO2: 24 mmol/L (ref 22–32)
Calcium: 8.5 mg/dL — ABNORMAL LOW (ref 8.9–10.3)
Chloride: 103 mmol/L (ref 101–111)
Creatinine, Ser: 2.37 mg/dL — ABNORMAL HIGH (ref 0.61–1.24)
GFR calc non Af Amer: 25 mL/min — ABNORMAL LOW (ref >60)
GFR, EST AFRICAN AMERICAN: 29 mL/min — AB (ref >60)
Glucose, Bld: 112 mg/dL — ABNORMAL HIGH (ref 65–99)
POTASSIUM: 4.6 mmol/L (ref 3.5–5.1)
SODIUM: 137 mmol/L (ref 135–145)

## 2017-12-16 LAB — CBC WITH DIFFERENTIAL/PLATELET
BASOS ABS: 0.1 10*3/uL (ref 0.0–0.1)
BASOS PCT: 1 %
EOS ABS: 0.3 10*3/uL (ref 0.0–0.7)
Eosinophils Relative: 6 %
HCT: 28.4 % — ABNORMAL LOW (ref 39.0–52.0)
HEMOGLOBIN: 8.7 g/dL — AB (ref 13.0–17.0)
Lymphocytes Relative: 13 %
Lymphs Abs: 0.7 10*3/uL (ref 0.7–4.0)
MCH: 27.4 pg (ref 26.0–34.0)
MCHC: 30.6 g/dL (ref 30.0–36.0)
MCV: 89.6 fL (ref 78.0–100.0)
MONOS PCT: 11 %
Monocytes Absolute: 0.6 10*3/uL (ref 0.1–1.0)
NEUTROS ABS: 3.9 10*3/uL (ref 1.7–7.7)
NEUTROS PCT: 69 %
Platelets: 219 10*3/uL (ref 150–400)
RBC: 3.17 MIL/uL — AB (ref 4.22–5.81)
RDW: 17.5 % — ABNORMAL HIGH (ref 11.5–15.5)
WBC: 5.7 10*3/uL (ref 4.0–10.5)

## 2017-12-16 LAB — COOXEMETRY PANEL
Carboxyhemoglobin: 2 % — ABNORMAL HIGH (ref 0.5–1.5)
Methemoglobin: 0.5 % (ref 0.0–1.5)
O2 SAT: 94.3 %
Total hemoglobin: 8.7 g/dL — ABNORMAL LOW (ref 12.0–16.0)

## 2017-12-16 LAB — PROTIME-INR
INR: 2.25
PROTHROMBIN TIME: 24.7 s — AB (ref 11.4–15.2)

## 2017-12-16 LAB — LACTATE DEHYDROGENASE: LDH: 259 U/L — AB (ref 98–192)

## 2017-12-16 MED ORDER — METOPROLOL SUCCINATE ER 25 MG PO TB24: 12.5000 mg | ORAL_TABLET | Freq: Every day | ORAL | Status: DC

## 2017-12-16 NOTE — Progress Notes (Signed)
ANTICOAGULATION CONSULT NOTE - Follow Up Consult  Pharmacy Consult for Heparin and Warfarin Indication: LVAD  Allergies  Allergen Reactions  . Demerol [Meperidine] Other (See Comments)    Paralysis. Could only move eyes.     Patient Measurements: Height: 6' (182.9 cm) Weight: 197 lb 5 oz (89.5 kg) IBW/kg (Calculated) : 77.6  Vital Signs: Temp: 98.2 F (36.8 C) (02/11 1213) Temp Source: Oral (02/11 1213) BP: 103/86 (02/11 1213) Pulse Rate: 60 (02/11 1213)  Labs: Recent Labs    12/14/17 0501 12/15/17 0440 12/16/17 0630  HGB 8.9* 8.4* 8.7*  HCT 28.9* 27.4* 28.4*  PLT 216 214 219  LABPROT 18.8* 23.1* 24.7*  INR 1.59 2.06 2.25  CREATININE 3.21* 2.92* 2.37*    Estimated Creatinine Clearance: 28.2 mL/min (A) (by C-G formula based on SCr of 2.37 mg/dL (H)).  Assessment: [name] with LVAD on warfarin at home admitted for RHC and ureteroscopy. INR 2.2 on admit, warfarin held, and he was started on heparin once INR < 2.0. S/P cystoscopy + ureteroscopy with stent placement 1/30. Warfarin resumed 1/31. "Low dose" heparin resumed 2/1.   Anticoagulation held 2/4 for HD catheter placement. Underwent HD x 1 on 2/5. SCr improving and no further HD planned. No further interventions planned per urology either.   Heparin stopped on 2/10. INR today increased from 2.06 to 2.25. Hgb is stable at 8.7, plts are WNL. LDH is stable at 259. No further hematuria- no s/sx of bleeding. Plan to remove HD catheter today.  Home Warfarin Dose: 4mg  Tues/2mg  all other days  Goal of Therapy:  INR 2-2.5 Monitor platelets by anticoagulation protocol: Yes   Plan:  1) Order home dose of warfarin 2 mg tonight 2) Daily INR, CBC, LDH  Doylene Canard, PharmD Clinical Pharmacist  Pager: [phone] Clinical Phone for 12/16/2017 until 3:30pm: [phone] If after 3:30pm, please call main pharmacy at [phone] 12/16/2017 1:17 PM

## 2017-12-16 NOTE — Progress Notes (Signed)
Patient ID: [name], male   DOB: 11/17/1940, 77 y.o.   MRN: [id number] [facility] Progress Note   Assessment/ Plan:   1. 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 Urine output stable with some improvement of creatinine noted consistent with trend. No indication for dialysis at this time after he received a single treatment earlier this week for uremic symptoms. -daily labs -d/c HD cath  2. Bilateral hydronephrosis/bladder tumor (high-grade urothelial cancer): Ongoing close follow-up by urology and plans noted for discontinuation of Foley catheter today-hematuria appears to have resolved (now off of heparin drip with an INR of 2.0).  3. 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  4. Hyponatremia: Secondary to history of congestive heart failure and likely compounded by recent acute kidney injury/free water excretion defect. Appears corrected at this time with improving urine output.  5. Anemia: Secondary to chronic kidney disease/chronic illness and compounded by recent hematuria-we'll continue to follow and avoid ESA at this time.   Subjective:   Sitting in chair, no complaints. Feels well, would like to go home tomorrow.    Objective:   BP 105/85 (BP Location: Left Arm)   Pulse 60   Temp 97.7 F (36.5 C) (Oral)   Resp (!) 9   Ht 6' (1.829 m)   Wt 197 lb 5 oz (89.5 kg)   SpO2 100%   BMI 26.76 kg/m   Intake/Output Summary (Last 24 hours) at 12/16/2017 0949 Last data filed at 12/16/2017 0800 Gross per 24 hour  Intake 720 ml  Output 1750 ml  Net -1030 ml   Weight change: 1 lb 14.6 oz (0.867 kg)  Physical Exam: Gen: Comfortably resting in bed CVS: Pulse regular rhythm, normal rate, mechanical heart sounds with LVAD hum Resp: Decreased breath sounds at  the bases, no distinct rales or rhonchi. Right IJ temporary hemodialysis catheter in place Abd: Soft, flat, nontender Ext: No lower extremity edema noted.  Imaging: Ct Renal Stone Study  Result Date: 12/14/2017 CLINICAL DATA:  Possible ischemic ATN due to relative hypotension and ureteral obstruction. Overnight, slight downtrend of urine output and rise of creatinine noted. Would favor repeat stone protocol CT scan to evaluate for resolving hydronephrosis or need for percutaneous nephrostomy tube. Transiently required hemodialysis (single treatment) earlier this week for uremic symptoms. Appeared to have been showing renal recovery however creatinine higher over the past 24 hours. 2. Bilateral hydronephrosis/bladder tumor (high-grade urothelial cancer): Had some transient hematuria after heparin was restarted- bladder irrigation performed yesterday afternoon. EXAM: CT ABDOMEN AND PELVIS WITHOUT CONTRAST TECHNIQUE: Multidetector CT imaging of the abdomen and pelvis was performed following the standard protocol without IV contrast. COMPARISON:  12/10/2017 FINDINGS: Lower chest: Centrilobular emphysema in the lung bases. LVAD device in the central LEFT upper abdomen Hepatobiliary: No focal hepatic lesions noncontrast exam. Pancreas: Pancreas is normal. No ductal dilatation. No pancreatic inflammation. Spleen: Normal spleen Adrenals/urinary tract: Adrenal glands normal. Mild LEFT hydronephrosis and hydroureter is similar to comparison exam. No obstructing lesion identified. Potential caliber change in the LEFT ureter at the level of the aortic bifurcation (image 48, series 6) with distal ureter collapse. No obstructing calculus is identified. No bladder calculi. Foley catheter within the collapsed bladder Mild hydroureter on the RIGHT slightly increased. No hydronephrosis on the RIGHT. Bilateral simple fluid renal cysts. Stomach/Bowel: Stomach, small  bowel, appendix, and cecum are normal. The colon and  rectosigmoid colon are normal. Vascular/Lymphatic: Abdominal aorta is normal caliber with atherosclerotic calcification. There is no retroperitoneal or periportal lymphadenopathy. No pelvic lymphadenopathy. Abdominal aortic aneurysm repair noted Reproductive: Prostate normal volume Other: No free fluid. Musculoskeletal: No aggressive osseous lesion. IMPRESSION: 1. Minimal interval change. Mild hydronephrosis and hydroureter on the LEFT with mild caliber change in the LEFT ureter at the aortic bifurcation. No obstructing lesion identified. 2. Mild hydroureter on the RIGHT slightly increased. No hydronephrosis on the RIGHT. 3. Bladder collapsed around Foley catheter. 4. Centrilobular emphysema in the lung bases. LVAD device in the LEFT upper abdomen. Electronically Signed   By: Suzy Bouchard M.D.   On: 12/14/2017 17:01    Labs: BMET Recent Labs  Lab 12/10/17 0430 12/11/17 0827 12/12/17 0624 12/13/17 0500 12/14/17 0501 12/15/17 0440 12/16/17 0630  NA 135 134* 137 135 134* 137 137  K 4.5 4.5 4.6 4.6 4.6 4.4 4.6  CL 101 97* 100* 99* 99* 102 103  CO2 20* 24 25 25 23 24 24   GLUCOSE 126* 145* 113* 115* 138* 114* 112*  BUN 65* 35* 36* 33* 35* 37* 36*  CREATININE 6.25* 3.15* 2.80* 2.39* 3.21* 2.92* 2.37*  CALCIUM 8.3* 8.1* 8.2* 8.3* 8.3* 8.3* 8.5*   CBC Recent Labs  Lab 12/13/17 0500 12/14/17 0501 12/15/17 0440 12/16/17 0630  WBC 5.5 6.9 4.7 5.7  NEUTROABS 3.6 4.9 3.0 3.9  HGB 9.0* 8.9* 8.4* 8.7*  HCT 29.3* 28.9* 27.4* 28.4*  MCV 89.3 90.0 89.3 89.6  PLT 226 216 214 219    Medications:    . amiodarone  200 mg Oral Daily  . atorvastatin  80 mg Oral q1800  . docusate sodium  200 mg Oral Daily  . levothyroxine  150 mcg Oral QAC breakfast  . mouth rinse  15 mL Mouth Rinse BID  . pantoprazole  40 mg Oral Daily  . ranolazine  500 mg Oral BID  . sildenafil  20 mg Oral TID  . sodium chloride flush  3 mL Intravenous Q12H  . tamsulosin  0.4 mg Oral Daily  . warfarin  2 mg Oral Once  per day on Sun Mon Wed Thu Fri Sat  . [START ON 12/17/2017] warfarin  4 mg Oral Q Tue-1800  . Warfarin - Pharmacist Dosing Inpatient   Does not apply q1800   Ralene Ok, MD 12/16/17  I have seen and examined this patient and agree with plan and assessment in the above note with renal recommendations/intervention highlighted.  Hopefully he can be discharged tomorrow if his UOP and Scr remain stable.  He can follow up with Dr. Posey Pronto as an outpatient if his renal function does not normalize following stents.  He will also need close follow up with Urology for his bladder cancer and bilateral obstructive uropathy. Broadus John A Krisanne Lich,MD 12/16/2017 12:21 PM

## 2017-12-16 NOTE — Progress Notes (Signed)
Urology Inpatient Progress Report  Acute on chronic systolic (congestive) heart failure (HCC) [I50.23]  Procedure(s): CYSTOSCOPY WITH BILATERAL RETROGRADE PYELOGRAM, LEFT URETEROSCOPY AND STENT PLACEMENT HOLMIUM LASER APPLICATION TRANSURETHRAL RESECTION OF BLADDER TUMOR (TURBT)  12 Days Post-Op   Intv/Subj: No acute events overnight. Patient is without complaint. No further gross hematuria. We discussed the CT scan from over the weekend.  This had revealed persistent left-sided hydronephrosis down to the level of at least the aortic bifurcation but it looked to me like it persisted down to the bladder.  There was some minimal right-sided hydroureter.  There was no right sided hydronephrosis.  His creatinine has improved today to 2.4.  He has good urine output.  He has no complaints today.  Wants to go home.  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 0.9 %  sodium chloride infusion  250 mL Intravenous PRN Bensimhon, Shaune Pascal, MD   Stopped at 12/04/17 1330  . 0.9 %  sodium chloride infusion   Intravenous Once Kyung Rudd, CRNA      . 0.9 %  sodium chloride infusion  100 mL Intravenous PRN Elmarie Shiley, MD      . 0.9 %  sodium chloride infusion  100 mL Intravenous PRN Elmarie Shiley, MD      . acetaminophen (TYLENOL) tablet 650 mg  650 mg Oral Q4H PRN Clegg, Amy D, NP   650 mg at 12/09/17 2350  . alteplase (CATHFLO ACTIVASE) injection 2 mg  2 mg Intracatheter Once PRN Elmarie Shiley, MD      . amiodarone (PACERONE) tablet 200 mg  200 mg Oral Daily Larey Dresser, MD   200 mg at 12/16/17 0912  . atorvastatin (LIPITOR) tablet 80 mg  80 mg Oral q1800 Larey Dresser, MD   80 mg at 12/15/17 1759  . docusate sodium (COLACE) capsule 200 mg  200 mg Oral  Daily Larey Dresser, MD   200 mg at 12/16/17 0912  . guaiFENesin (ROBITUSSIN) 100 MG/5ML solution 100 mg  5 mL Oral Q4H PRN Bensimhon, Shaune Pascal, MD   100 mg at 12/08/17 2300  . heparin injection 1,000 Units  1,000 Units Dialysis PRN Elmarie Shiley, MD      . heparin injection   Intravenous PRN Aletta Edouard, MD   2,800 Units at 12/10/17 1546  . lactated ringers infusion   Intravenous Continuous Effie Berkshire, MD 0 mL/hr at 12/04/17 1226    . levothyroxine (SYNTHROID, LEVOTHROID) tablet 150 mcg  150 mcg Oral QAC breakfast Einar Grad, RPH   150 mcg at 12/16/17 7322  . lidocaine (PF) (XYLOCAINE) 1 % injection 5 mL  5 mL Intradermal PRN Elmarie Shiley, MD      . lidocaine (PF) (XYLOCAINE) 1 % injection    PRN Aletta Edouard, MD   2 mL at 12/10/17 1545  . lidocaine-prilocaine (EMLA) cream 1 application  1 application Topical PRN Elmarie Shiley, MD      . MEDLINE mouth rinse  15 mL Mouth Rinse BID Bensimhon, Shaune Pascal, MD   15 mL at 12/15/17 0906  . ondansetron (ZOFRAN) injection 4 mg  4 mg Intravenous Q6H PRN Clegg, Amy D, NP   4 mg at 12/09/17 1513  . pantoprazole (PROTONIX) EC tablet 40 mg  40 mg Oral Daily Larey Dresser, MD   40 mg at 12/16/17 0912  . pentafluoroprop-tetrafluoroeth (GEBAUERS) aerosol 1 application  1 application Topical PRN Elmarie Shiley, MD      . promethazine (PHENERGAN) injection 12.5 mg  12.5 mg Intravenous Q6H PRN Clegg, Amy D, NP   12.5 mg at 12/09/17 2350  . ranolazine (RANEXA) 12 hr tablet 500 mg  500 mg Oral BID Larey Dresser, MD   500 mg at 12/16/17 0912  . sildenafil (REVATIO) tablet 20 mg  20 mg Oral TID Bensimhon, Shaune Pascal, MD   20 mg at 12/16/17 1600  . sodium chloride flush (NS) 0.9 % injection 10-40 mL  10-40 mL Intracatheter PRN Bensimhon, Shaune Pascal, MD   10 mL at 12/14/17 2101  . sodium chloride flush (NS) 0.9 % injection 3 mL  3 mL Intravenous Q12H Bensimhon, Shaune Pascal, MD   3 mL at 12/16/17 0913  . sodium chloride flush (NS) 0.9 % injection 3 mL  3 mL  Intravenous PRN Bensimhon, Shaune Pascal, MD      . tamsulosin (FLOMAX) capsule 0.4 mg  0.4 mg Oral Daily Larey Dresser, MD   0.4 mg at 12/16/17 0912  . warfarin (COUMADIN) tablet 2 mg  2 mg Oral Once per day on Sun Mon Wed Thu Fri Sat Bensimhon, Shaune Pascal, MD   2 mg at 12/15/17 1759  . [START ON 12/17/2017] warfarin (COUMADIN) tablet 4 mg  4 mg Oral Q Tue-1800 Bensimhon, Shaune Pascal, MD      . Warfarin - Pharmacist Dosing Inpatient   Does not apply q1800 Otilio Miu, [facility]         Objective: Vital: Vitals:   12/16/17 0445 12/16/17 0759 12/16/17 1213 12/16/17 1550  BP: 90/72 105/85 103/86 (!) 115/92  Pulse: 61 60 60 60  Resp: (!) 23 (!) 9 14 (!) 23  Temp:  97.7 F (36.5 C) 98.2 F (36.8 C) 97.6 F (36.4 C)  TempSrc:  Oral Oral Oral  SpO2: 95% 100% 99% 99%  Weight:      Height:       I/Os: I/O last 3 completed shifts: In: 673.3 [P.O.:600; I.V.:73.3] Out: 2000 [Urine:2000]  Physical Exam:  General: Patient is in no apparent distress Lungs: Normal respiratory effort, chest expands symmetrically. GI: The abdomen is soft and nontender without mass. GU: Urine clear yellow in urinal Ext: lower extremities symmetric  Lab Results: Recent Labs    12/14/17 0501 12/15/17 0440 12/16/17 0630  WBC 6.9 4.7 5.7  HGB 8.9* 8.4* 8.7*  HCT 28.9* 27.4* 28.4*   Recent Labs    12/14/17 0501 12/15/17 0440 12/16/17 0630  NA 134* 137 137  K 4.6 4.4 4.6  CL 99* 102 103  CO2 23 24 24   GLUCOSE 138* 114* 112*  BUN 35* 37* 36*  CREATININE 3.21* 2.92* 2.37*  CALCIUM 8.3* 8.3* 8.5*   Recent Labs    12/14/17 0501 12/15/17 0440 12/16/17 0630  INR 1.59 2.06 2.25   No results for input(s): LABURIN in the last 72 hours. Results for orders placed or performed during the hospital encounter of 11/29/17  MRSA PCR Screening     Status: None   Collection Time: 11/29/17  5:47 PM  Result Value Ref Range Status   MRSA by PCR NEGATIVE NEGATIVE Final    Comment:        The GeneXpert MRSA Assay  (FDA approved for NASAL specimens only), is one component of a comprehensive MRSA  colonization surveillance program. It is not intended to diagnose MRSA infection nor to guide or monitor treatment for MRSA infections.   Culture, Urine     Status: Abnormal   Collection Time: 12/02/17  1:30 PM  Result Value Ref Range Status   Specimen Description URINE, CLEAN CATCH  Final   Special Requests NONE  Final   Culture <10,000 COLONIES/mL INSIGNIFICANT GROWTH (A)  Final   Report Status 12/03/2017 FINAL  Final   *Note: Due to a large number of results and/or encounters for the requested time period, some results have not been displayed. A complete set of results can be found in Results Review.    Studies/Results: No results found.  Assessment: Gross hematuria--resolved  High-grade T1 bladder cancer-- plan for outpatient BCG.  I would like to see him have no gross hematuria for 2 weeks  Left hydronephrosis-- I had a conversation with the patient regarding possible treatment options including possible diagnostic ureteroscopy with stent placement versus nephrostomy tube.  The patient is very happy that his creatinine has come down some.  We discussed his medical comorbidities in relation to a repeat surgery and he would like to hold off for now.  Given that his renal function is improving some, I think this is reasonable.  It is possible that he may have developed a ureteral stricture secondary to the ureteroscopy but hopefully his hydronephrosis will improve though I would have expected it to improve by now.  Perhaps a little bit more time for resolution of acute inflammation may allow for improvement.  If this does not improve, I will need to talk to him more about possible retrograde interrogation of the left ureter.  He would like to hold off for now.  Procedure(s): CYSTOSCOPY WITH BILATERAL RETROGRADE PYELOGRAM, LEFT URETEROSCOPY AND STENT PLACEMENT HOLMIUM LASER APPLICATION TRANSURETHRAL  RESECTION OF BLADDER TUMOR (TURBT), 12 Days Post-Op  doing well.   Link Snuffer, MD Urology 12/16/2017, 4:26 PM

## 2017-12-16 NOTE — Progress Notes (Signed)
Patient ID: ALFRED HARREL, male   DOB: 1941/08/28, 77 y.o.   MRN: [id number]   Advanced Heart Failure VAD Team Note  Subjective:    CT Renal Stone (2/5): Stable L ureteral stone non obstructing. Increasing filling defect within the urinary bladder suspicious for an underlying mass. Although this may simply represent a hematoma,urologic consultation and direct visualization is recommended.  CT stone protocol (2/9): No progression of hydroureter/hydronephrosis.   Cystocopy 12/04/17  - s/p Left ureteroscopy and stent placement.  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 Had lower abdominal pain so bladder irrigation started. Unfortunately he had a blockage so irrigation was stopped.  - 2/5 temp HD cath placed. Had HD  INR 2.25 today. Urine yellow, clear. No further hematuria.  Creatinine up 2.39 => 3.2 => 2.92 => 2.37.  UOP 1500 cc.   Feeling good today. Denies SOB. No further hematuria.   LDH 272>320>275>246 >259 Hgb 9.4>9>8.9>8.4 > 8.7 Creatinine 2.8 >2.39>3.21>2.92>2.37  LVAD INTERROGATION:  HeartMate II LVAD:  Flow 3.9 liters/min, speed 8800  power 5,  PI 7.9.  Occasional PI events/24 hrs  Objective:    Vital Signs:   Temp:  [97.5 F (36.4 C)-98.4 F (36.9 C)] 97.7 F (36.5 C) (02/11 0759) Pulse Rate:  [60-80] 60 (02/11 0759) Resp:  [9-23] 9 (02/11 0759) BP: (90-106)/(72-87) 105/85 (02/11 0759) SpO2:  [94 %-100 %] 100 % (02/11 0759) Weight:  [197 lb 5 oz (89.5 kg)] 197 lb 5 oz (89.5 kg) (02/11 0441) Last BM Date: 12/14/17 Mean arterial Pressure: 80s Intake/Output:   Intake/Output Summary (Last 24 hours) at 12/16/2017 0951 Last data filed at 12/16/2017 0800 Gross per 24 hour  Intake 720 ml  Output 1750 ml  Net -1030 ml     Physical Exam    Physical Exam: GENERAL: Well appearing this am. NAD.  HEENT: Normal. NECK: Supple, JVP 7-8 cm. Carotids OK.  CARDIAC:  Mechanical heart sounds with LVAD hum present.   LUNGS:  CTAB, normal effort.  ABDOMEN:  NT, ND, no HSM. No bruits or masses. +BS  LVAD exit site: Well-healed and incorporated. Dressing dry and intact. No erythema or drainage. Stabilization device present and accurately applied. Driveline dressing changed daily per sterile technique. EXTREMITIES:  Warm and dry. No cyanosis, clubbing, rash, or edema.  NEUROLOGIC:  Alert & oriented x 3. Cranial nerves grossly intact. Moves all 4 extremities w/o difficulty. Affect pleasant     Telemetry    A-paced in 60s, personally reviewed.   EKG    No new tracings.  Labs   Basic Metabolic Panel: Recent Labs  Lab 12/12/17 0624 12/13/17 0500 12/14/17 0501 12/15/17 0440 12/16/17 0630  NA 137 135 134* 137 137  K 4.6 4.6 4.6 4.4 4.6  CL 100* 99* 99* 102 103  CO2 25 25 23 24 24   GLUCOSE 113* 115* 138* 114* 112*  BUN 36* 33* 35* 37* 36*  CREATININE 2.80* 2.39* 3.21* 2.92* 2.37*  CALCIUM 8.2* 8.3* 8.3* 8.3* 8.5*    Liver Function Tests: No results for input(s): AST, ALT, ALKPHOS, BILITOT, PROT, ALBUMIN in the last 168 hours. No results for input(s): LIPASE, AMYLASE in the last 168 hours. No results for input(s): AMMONIA in the last 168 hours.  CBC: Recent Labs  Lab 12/12/17 0624 12/13/17 0500 12/14/17 0501 12/15/17 0440 12/16/17 0630  WBC 5.8 5.5 6.9 4.7 5.7  NEUTROABS 3.6 3.6 4.9 3.0 3.9  HGB 9.4* 9.0* 8.9* 8.4* 8.7*  HCT  30.2* 29.3* 28.9* 27.4* 28.4*  MCV 89.6 89.3 90.0 89.3 89.6  PLT 216 226 216 214 219    INR: Recent Labs  Lab 12/12/17 0624 12/13/17 0500 12/14/17 0501 12/15/17 0440 12/16/17 0630  INR 1.54 1.45 1.59 2.06 2.25    Other results:     Imaging   Ct Renal Stone Study  Result Date: 12/14/2017 CLINICAL DATA:  Possible ischemic ATN due to relative hypotension and ureteral obstruction. Overnight, slight downtrend of urine output and rise of creatinine noted. Would favor repeat stone protocol CT scan to evaluate for resolving hydronephrosis or need for  percutaneous nephrostomy tube. Transiently required hemodialysis (single treatment) earlier this week for uremic symptoms. Appeared to have been showing renal recovery however creatinine higher over the past 24 hours. 2. Bilateral hydronephrosis/bladder tumor (high-grade urothelial cancer): Had some transient hematuria after heparin was restarted- bladder irrigation performed yesterday afternoon. EXAM: CT ABDOMEN AND PELVIS WITHOUT CONTRAST TECHNIQUE: Multidetector CT imaging of the abdomen and pelvis was performed following the standard protocol without IV contrast. COMPARISON:  12/10/2017 FINDINGS: Lower chest: Centrilobular emphysema in the lung bases. LVAD device in the central LEFT upper abdomen Hepatobiliary: No focal hepatic lesions noncontrast exam. Pancreas: Pancreas is normal. No ductal dilatation. No pancreatic inflammation. Spleen: Normal spleen Adrenals/urinary tract: Adrenal glands normal. Mild LEFT hydronephrosis and hydroureter is similar to comparison exam. No obstructing lesion identified. Potential caliber change in the LEFT ureter at the level of the aortic bifurcation (image 48, series 6) with distal ureter collapse. No obstructing calculus is identified. No bladder calculi. Foley catheter within the collapsed bladder Mild hydroureter on the RIGHT slightly increased. No hydronephrosis on the RIGHT. Bilateral simple fluid renal cysts. Stomach/Bowel: Stomach, small bowel, appendix, and cecum are normal. The colon and rectosigmoid colon are normal. Vascular/Lymphatic: Abdominal aorta is normal caliber with atherosclerotic calcification. There is no retroperitoneal or periportal lymphadenopathy. No pelvic lymphadenopathy. Abdominal aortic aneurysm repair noted Reproductive: Prostate normal volume Other: No free fluid. Musculoskeletal: No aggressive osseous lesion. IMPRESSION: 1. Minimal interval change. Mild hydronephrosis and hydroureter on the LEFT with mild caliber change in the LEFT ureter at  the aortic bifurcation. No obstructing lesion identified. 2. Mild hydroureter on the RIGHT slightly increased. No hydronephrosis on the RIGHT. 3. Bladder collapsed around Foley catheter. 4. Centrilobular emphysema in the lung bases. LVAD device in the LEFT upper abdomen. Electronically Signed   By: Suzy Bouchard M.D.   On: 12/14/2017 17:01     Medications:     Scheduled Medications: . amiodarone  200 mg Oral Daily  . atorvastatin  80 mg Oral q1800  . docusate sodium  200 mg Oral Daily  . levothyroxine  150 mcg Oral QAC breakfast  . mouth rinse  15 mL Mouth Rinse BID  . pantoprazole  40 mg Oral Daily  . ranolazine  500 mg Oral BID  . sildenafil  20 mg Oral TID  . sodium chloride flush  3 mL Intravenous Q12H  . tamsulosin  0.4 mg Oral Daily  . warfarin  2 mg Oral Once per day on Sun Mon Wed Thu Fri Sat  . [START ON 12/17/2017] warfarin  4 mg Oral Q Tue-1800  . Warfarin - Pharmacist Dosing Inpatient   Does not apply q1800    Infusions: . sodium chloride Stopped (12/04/17 1330)  . sodium chloride    . sodium chloride    . sodium chloride    . lactated ringers 0 mL/hr at 12/04/17 1226    PRN Medications: sodium  chloride, sodium chloride, sodium chloride, acetaminophen, alteplase, guaiFENesin, heparin, heparin, lidocaine (PF), lidocaine (PF), lidocaine-prilocaine, ondansetron (ZOFRAN) IV, pentafluoroprop-tetrafluoroeth, promethazine, sodium chloride flush, sodium chloride flush   VAD EVENTS   GU Event 09/01/14 had impacted ureteral stone and underwent cystoscopy and flexible ureteroscopy with lithotripsy and basket extraction.   09/2015 Hematuria and chronic nephrolithiasis. Renal US showed mild hyronephrosis and bilateral renal cysts. Dr. Risa Grill took back for repeat lithotripsy and stone extraction by ureteroscopy and J stent placement. 12/18 Recurrent hematuria. 71mm left ureteral stone. Passed with tamulosin  GI Event  02/2015 - Hgb 5.4 --> EGD that showed 2 AVMs in the  jejunum that were clipped.  4/2-18 - Episode of melena. Hgb 10.9-> 8.7. EGD/colonoscopy negative 12/18 - Melena with syncope  Hgb 6.5. Transfused 5u. 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  01/2016 R CEA. He was started on Plavix.  01/2016 Lumbar fusion L3-L5 with pedicle screws posterolateral arthrodesis with BMP, allograft  Patient Profile  CHESNEY KLIMASZEWSKI is a 77 y.o. male  with h/o VT, PAD and severe systolic HF (EF 90-24%) due to mixed ischemic/nonischemic CM he is s/p HM II LVAD implant for destination therapy on October 19, 2013.VAD implantation was complicated by VT, syncope, AF/Aflutter and RHF. Required short term milrinone.   Admitted 1/25 for elective ureteroscopy due to R ureteral stone.  Still with occasional hematuria. Continues with fatigue. RHC 1/25 showed low output due to RV failure (see below). Milrinone initiated at 0.125.  Assessment/Plan:    1. 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 Struggling with progressive NYHA III symptoms. [place] 1/25 showed primarily RHF with low output. No real symptomatic improvement with milrinone. Milrinone stopped 2/5. Volume status stable today, LDH lower.   - VAD interrogated personally. Parameters stable.  - Continue Sildenafil 20 mg TID with RV failure.  - Continue VAD speed at 8800.  2. Recurrent renal stone with ongoing hematuria and UTI: Left ureteral stone in 12/18.  12/03/2017 CT with L stone and possible mass.  Cystoscopy + Left Ureteroscopy with stent placed 12/04/17. Completed rocephin 12/03/17. Ucx negative. Stent removed by Dr Jeffie Pollock 2/3.  Renal US 2/5 with bilateral hydronephrosis.  Stone protocol CT on 2/5 with mild left hydronephrosis, minimal dilitation on right without hydronephrosis.  Repeat stone protocol CT on 2/9 with no progression of hydronephrosis/hydroureter. Foley removed 12/15/17. - Urine remains clear today.  3. AKI: Suspect due to relative hypotension +  ureteral obstruction.  Had HD x 1 on 2/5.  Creatinine trending down today, good UOP. As above, no progression of hydronephrosis on CT yesterday.  - HD cath out today. Will follow creatinine 24 hrs and likely home tomorrow.  4. Bladder Tumor:  Seen on CT 1/29 and confirmed/resected on cystoscopy 12/04/17.  Pathology c/w high-grade urothelial CA. Discussed with Dr. Gloriann Loan. Plan outpatient BCG treatment. - As above. Needs further w/u for obstruction. No change.  5. LVAD: VAD interrogated personally. Parameters stable. LDH stable. INR 2.2. He is off heparin. He has history of CVA.   - He is now off heparin gtt and just on warfarin.  6. H/o GIB: S/p clipping of 2 jejunal AVMs in 4/16.  Admitted 4/18 with recurrent GIB. Transfused 2u. EGD/colon negative. Admitted 12/18, Hgb 6.5, Transfused 5u. EGD with 2 AVMs treated with APC - Hgb slow downtrend 9.4 -->9 --> 8.9 --> 8.4.--> 8.7 7.  Atrial arrhythmias: Paroxysmal atrial fibrillation, atrial tachycardia.  S/p DC-CV 06/04/14,  DC-CV 03/02/15 and 01/19/16.  He is a-paced currently.  - Continue amiodarone.  8. HTN:   - BP stable. MAP up slightly this am.  9. 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  - No deficit. No recurrence.  10. H/o amio-induced hypothyroidism: Amio restarted due to recurrent atrial arrhythmias and VT.  - Continue home levothyroxine dose. No change.  11. VT: Underwent DC-CV 9/17. Repeat episode of slow VT 08/17/17 requiring DC-CV.  - Continue amio 200 daily. No change.   Improving. Only home meds that he has not been placed back on are lasix and toprol.  Will discuss dosing with MD.   Length of Stay: [place], [place] 12/16/2017, 9:51 AM  VAD Team --- VAD ISSUES ONLY--- Pager [phone] ([place])  Advanced Heart Failure Team  Pager [phone] (M-F; 7a - 4p)   Please contact [facility] for night-coverage after hours (4p -7a ) and weekends on [email or web address]  Patient seen and examined with the above-signed Advanced Practice  Provider and/or Housestaff. I personally reviewed laboratory data, imaging studies and relevant notes. I independently examined the patient and formulated the important aspects of the plan. I have edited the note to reflect any of my changes or salient points. I have personally discussed the plan with the patient and/or family.  Feeling better today. Good urine output. Hematuria has cleared. Hgb stable. Renal function continues to improve. INR 2.25. Volume status and HF stable. VAD interrogated personally. Parameters stable. Will have tunneled cath pulled today. Possibly home in am.  Glori Bickers, MD  2:54 PM

## 2017-12-16 NOTE — Progress Notes (Signed)
LVAD Coordinator Rounding Note:  Admitted 11/29/17 due for elective ureteroscopy due to right ureteral stone.   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 Cyndia Bent with oversewn aortic valveunder DT criteria.   Vital signs: Temp: 97.7 HR:60 Doppler Pressure:96 Automatic BP:105/85 (93) O2 Sat: 100% on RA   Weight: 194>191>196>197>196>197>199>200>197>195>195>194>196>197lbs  GTTS: Milrinone 0.1 mcg/kg/min- stopped 12/10/17 Heparin 500 units/hr- stopped 12/15/17  LVAD interrogation reveals:  Speed: 8800 Flow: 3.7 Power: 4.5w PI: 7.4 Alarms: none Events:none  Fixed speed: 8800 Low speed limit: 8200  Drive Line: [place] dressing dry and intact; anchor intact and accurately applied. Weekly dressing changes;Last changed 12/15/17 by [name], his partner.Next change is due 12/22/17.  Labs:  LDH trend: 362>332>270>275>280>269>285>319>259>253>288>272>320>275>246>259  INR trend: 2.20>2.12>1.89>1.90>1.75>1.58>1.48>1.33>1.27>2.11>2.35>1.85>1.54>1.45>1.59<2.06> 2.25  Creat trend: 1.46>1.33>2.21>2.46>6.25>2.8>2.39>3.21>2.92>2.37  Anticoagulation Plan: - INR Goal: 2.0 - 2.5 - ASA Dose: no ASA (removed 02/2017 due to GI bleed)  Device: -Dual [employer] ICD -Therapies: on at 200 bpm - Last device check 10/02/17  Significant Events on VAD Support:  09/01/14>>ureteral stone with cystoscopy and flexible ureteroscopy with lithotripsy and basket extraction 02/2015 >>GIB with 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 Started on Plavix 3/17>>Lumbar fusion L3-L5 9/17>>VT with DC-CV; amio increased 10/17>>ramp with RV failure; speed decreased to 8800 02/2017> GIB (removed ASA, scopes negative) 03/2017>percutaneous lead repair after pump stops  10/18>>Slow VT. Cardioverted in ED and amiodarone uptitrated 12/18>>melena with  scope. Hgb 6.5. EGD with 2 AVMs with APC 12/18>>recurrent hematuria. 6 mm left ureteral stone 1/19>>7 mm left ureteral stone removal with laser lithotripsy; left ureteral stent. Resection of 6 cm bladder tumor   Plan/Recommendations:   1. Please call VAD pager if any issues with VAD equipment or questions, concerns.  Balinda Quails RN, VAD Coordinator 24/7 pager [phone]

## 2017-12-16 NOTE — Plan of Care (Signed)
Continue current care plan 

## 2017-12-16 NOTE — Progress Notes (Signed)
Physical Therapy Treatment and Discharge Summary Patient Details Name: [name] MRN: [id number] DOB: Mar 21, 1941 Today's Date: 12/16/2017    History of Present Illness Pt adm with lt uretal calculus and possible bladder mass. 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 Stent was placed and resection performed. Post-op required HD. Currently monitoring for possible further HD. Pt PMH - LVAD, chf, cad, cva, arthritis    PT Comments    Pt doing well with mobility and no further PT needed.  Ready for dc from PT standpoint.     Follow Up Recommendations  No PT follow up     Equipment Recommendations  None recommended by PT    Recommendations for Other Services       Precautions / Restrictions Precautions Precautions: Other (comment) Precaution Comments: LVAD Restrictions Weight Bearing Restrictions: No    Mobility  Bed Mobility               General bed mobility comments: Pt up in chair  Transfers Overall transfer level: Modified independent Equipment used: None Transfers: Sit to/from Stand Sit to Stand: Modified independent (Device/Increase time)            Ambulation/Gait Ambulation/Gait assistance: Modified independent (Device/Increase time) Ambulation Distance (Feet): 500 Feet Assistive device: None Gait Pattern/deviations: Step-through pattern;Decreased stride length     General Gait Details: Pt with steady gait.   Stairs            Wheelchair Mobility    Modified Rankin (Stroke Patients Only)       Balance Overall balance assessment: Needs assistance Sitting-balance support: No upper extremity supported;Feet supported Sitting balance-Leahy Scale: Normal     Standing balance support: No upper extremity supported;During functional activity Standing balance-Leahy Scale: Good                              Cognition Arousal/Alertness: Awake/alert Behavior During Therapy: WFL for tasks  assessed/performed Overall Cognitive Status: Within Functional Limits for tasks assessed                                        Exercises      General Comments        Pertinent Vitals/Pain      Home Living                      Prior Function            PT Goals (current goals can now be found in the care plan section) Progress towards PT goals: Goals met/education completed, patient discharged from PT    Frequency           PT Plan Current plan remains appropriate    Co-evaluation              AM-PAC PT "6 Clicks" Daily Activity  Outcome Measure  Difficulty turning over in bed (including adjusting bedclothes, sheets and blankets)?: None Difficulty moving from lying on back to sitting on the side of the bed? : None Difficulty sitting down on and standing up from a chair with arms (e.g., wheelchair, bedside commode, etc,.)?: None Help needed moving to and from a bed to chair (including a wheelchair)?: None Help needed walking in hospital room?: None Help needed climbing 3-5 steps with a railing? : None 6 Click Score: 24  End of Session   Activity Tolerance: Patient tolerated treatment well Patient left: in chair;with call bell/phone within reach   PT Visit Diagnosis: Other abnormalities of gait and mobility (R26.89);Muscle weakness (generalized) (M62.81)     Time: 9458-5929 PT Time Calculation (min) (ACUTE ONLY): 12 min  Charges:  $Gait Training: 8-22 mins                    G Codes:       [facility] PT [place] 12/16/2017, 10:50 AM

## 2017-12-17 LAB — CBC WITH DIFFERENTIAL/PLATELET
BASOS ABS: 0 10*3/uL (ref 0.0–0.1)
Basophils Relative: 1 %
Eosinophils Absolute: 0.2 10*3/uL (ref 0.0–0.7)
Eosinophils Relative: 4 %
HEMATOCRIT: 27.5 % — AB (ref 39.0–52.0)
HEMOGLOBIN: 8.5 g/dL — AB (ref 13.0–17.0)
LYMPHS PCT: 12 %
Lymphs Abs: 0.7 10*3/uL (ref 0.7–4.0)
MCH: 27.4 pg (ref 26.0–34.0)
MCHC: 30.9 g/dL (ref 30.0–36.0)
MCV: 88.7 fL (ref 78.0–100.0)
Monocytes Absolute: 0.6 10*3/uL (ref 0.1–1.0)
Monocytes Relative: 10 %
NEUTROS ABS: 4.3 10*3/uL (ref 1.7–7.7)
Neutrophils Relative %: 73 %
Platelets: 221 10*3/uL (ref 150–400)
RBC: 3.1 MIL/uL — AB (ref 4.22–5.81)
RDW: 17.6 % — ABNORMAL HIGH (ref 11.5–15.5)
WBC: 5.9 10*3/uL (ref 4.0–10.5)

## 2017-12-17 LAB — COOXEMETRY PANEL
Carboxyhemoglobin: 1.8 % — ABNORMAL HIGH (ref 0.5–1.5)
Methemoglobin: 1.3 % (ref 0.0–1.5)
O2 Saturation: 88.1 %
Total hemoglobin: 8.6 g/dL — ABNORMAL LOW (ref 12.0–16.0)

## 2017-12-17 LAB — BASIC METABOLIC PANEL
ANION GAP: 9 (ref 5–15)
BUN: 28 mg/dL — ABNORMAL HIGH (ref 6–20)
CHLORIDE: 104 mmol/L (ref 101–111)
CO2: 24 mmol/L (ref 22–32)
Calcium: 8.4 mg/dL — ABNORMAL LOW (ref 8.9–10.3)
Creatinine, Ser: 1.9 mg/dL — ABNORMAL HIGH (ref 0.61–1.24)
GFR calc Af Amer: 37 mL/min — ABNORMAL LOW (ref >60)
GFR, EST NON AFRICAN AMERICAN: 32 mL/min — AB (ref >60)
GLUCOSE: 123 mg/dL — AB (ref 65–99)
Potassium: 4.4 mmol/L (ref 3.5–5.1)
Sodium: 137 mmol/L (ref 135–145)

## 2017-12-17 LAB — LACTATE DEHYDROGENASE: LDH: 259 U/L — ABNORMAL HIGH (ref 98–192)

## 2017-12-17 LAB — PROTIME-INR
INR: 2.45
Prothrombin Time: 26.4 seconds — ABNORMAL HIGH (ref 11.4–15.2)

## 2017-12-17 MED ORDER — SILDENAFIL CITRATE 20 MG PO TABS: 20.0000 mg | ORAL_TABLET | Freq: Three times a day (TID) | ORAL | 3 refills | Status: DC

## 2017-12-17 MED ORDER — WARFARIN SODIUM 1 MG PO TABS
1.0000 mg | ORAL_TABLET | Freq: Once | ORAL | Status: AC
  Administered 2017-12-17: 1 mg via ORAL
  Filled 2017-12-17: qty 1

## 2017-12-17 MED ORDER — WARFARIN SODIUM 4 MG PO TABS: 4.0000 mg | ORAL_TABLET | ORAL | Status: DC

## 2017-12-17 NOTE — Discharge Summary (Signed)
Advanced Heart Failure Team  Discharge Summary   Patient ID: [name] MRN: [id number], DOB/AGE: December 03, 1940 77 y.o. Admit date: 11/29/2017 D/C date:     12/17/2017   Primary Discharge Diagnoses:  1. 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 Recurrent renal stone with ongoing hematuria and UTI 3. AKI 4. Bladder Tumor s/p resection  5. LVAD 6. H/o GIB 7. Atrial arrhythmias 8. HTN 9. CVA 84. H/o amio-induced hypothyroidism 11. VT  Hospital Course:   [name] is a 77 y.o. male  with h/o VT, PAD and severe systolic HF (EF 21-30%) due to mixed ischemic/nonischemic CM he is s/p HM II LVAD implant for destination therapy on October 19, 2013.VAD implantation was complicated by VT, syncope, AF/Aflutter and RHF. Required short term milrinone.   Admitted 1/25 for elective ureteroscopy due to R ureteral stone. Still with occasional hematuria. Continues with fatigue. RHC 1/25 showed low output due to RV failure (see below). Milrinone initiated at 0.125.  Pt also started on sildenafil.  CT renal stone 12/03/17 showed stable L ureteral stone with increased filling defect thought to be hematoma vs mass.   Pt underwent planned cystoscopy 12/04/17 as below with left ureteroscopy and stent placement.  Incidental finding of Bladder Tumor with resection.  Pathology + for urothelial CA. Plan for BCG treatment as outpatient.  Foley left in post op while stent in place. Left ureteral stent removed 12/08/17. Pt developed lower abdominal pain so bladder irrigation started. Developed blockage so irrigation stopped.   Post op course complicated by AKI. Nephrology consulted and temp HD cath placed. Creatinine peaked at 6.25 12/10/17 so received ONE episode of HD. After this, Pt had gradual renal recovery. Lasix adjusted as needed. HD cath removed 12/16/17. Pt had gradual resolution of hematuria, and Creatinine nearly back to baseline at time of discharge.   Pt seen am of  12/17/17 and thought stable for discharge with no hematuria and improving renal function. He will have very close follow up as below in the HF clinic. He will follow up with urology as outpatient for further treatment of his bladder mass.   LVAD Interrogation HM II:   Speed: 8800 Flow: 3.8 PI: 8.3 Power: 4.0, No PI events  Physical Exam GENERAL: Well appearing this am. NAD.  HEENT: Normal. NECK: Supple, JVP 7-8 cm. Carotids OK.  CARDIAC:  Mechanical heart sounds with LVAD hum present.  LUNGS:  CTAB, normal effort.  ABDOMEN:  NT, ND, no HSM. No bruits or masses. +BS  LVAD exit site: Well-healed and incorporated. Dressing dry and intact. No erythema or drainage. Stabilization device present and accurately applied. Driveline dressing changed daily per sterile technique. EXTREMITIES:  Warm and dry. No cyanosis, clubbing, rash, or edema.  NEUROLOGIC:  Alert & oriented x 3. Cranial nerves grossly intact. Moves all 4 extremities w/o difficulty. Affect pleasant     Discharge Weight: 197 lbs Discharge Vitals: Blood pressure (!) 109/91, pulse (!) 58, temperature 97.8 F (36.6 C), temperature source Oral, resp. rate 12, height 6' (1.829 m), weight 197 lb 12 oz (89.7 kg), SpO2 92 %.  Labs: Lab Results  Component Value Date   WBC 5.9 12/17/2017   HGB 8.5 (L) 12/17/2017   HCT 27.5 (L) 12/17/2017   MCV 88.7 12/17/2017   PLT 221 12/17/2017    Recent Labs  Lab 12/17/17 0755  NA 137  K 4.4  CL 104  CO2 24  BUN 28*  CREATININE 1.90*  CALCIUM 8.4*  GLUCOSE 123*   Lab Results  Component Value Date   CHOL 106 11/05/2015   HDL 33 (L) 11/05/2015   LDLCALC 60 11/05/2015   TRIG 67 11/05/2015   BNP (last 3 results) Recent Labs    08/17/17 1900  BNP 627.8*    ProBNP (last 3 results) No results for input(s): PROBNP in the last 8760 hours.   Diagnostic Studies/Procedures   CT Renal Stone (12/03/17): Stable L ureteral stone non obstructing. Increasing filling defect within the urinary  bladder suspicious for an underlying mass. Although this may simply represent a hematoma,urologic consultation and direct visualization is recommended.    CT stone protocol (12/14/17): No progression of hydroureter/hydronephrosis.   Cystocopy (12/04/17) - s/p Left ureteroscopy and stent placement.  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 Had lower abdominal pain so bladder irrigation started. Unfortunately he had a blockage so irrigation was stopped.  - 2/5 temp HD cath placed. Had HD  Discharge Medications   Allergies as of 12/17/2017      Reactions   Demerol [meperidine] Other (See Comments)   Paralysis. Could only move eyes.      Medication List    STOP taking these medications   metoprolol succinate 25 MG 24 hr tablet Commonly known as:  TOPROL-XL     TAKE these medications   amiodarone 200 MG tablet Commonly known as:  PACERONE Take 200 mg by mouth daily.   atorvastatin 80 MG tablet Commonly known as:  LIPITOR TAKE 1 TABLET DAILY AT 6 PM What changed:  See the new instructions.   docusate sodium 100 MG capsule Commonly known as:  COLACE Take 200 mg by mouth every morning.   furosemide 20 MG tablet Commonly known as:  LASIX Take 20 mg by mouth daily as needed for fluid.   ICAPS PO Take 1 tablet by mouth 2 (two) times daily.   levothyroxine 150 MCG tablet Commonly known as:  SYNTHROID, LEVOTHROID Take 1 tablet (150 mcg total) by mouth daily before breakfast.   nitroGLYCERIN 0.4 MG SL tablet Commonly known as:  NITROSTAT Place 0.4 mg under the tongue every 5 (five) minutes as needed for chest pain.   pantoprazole 40 MG tablet Commonly known as:  PROTONIX Take 1 tablet (40 mg total) by mouth 2 (two) times daily.   ranolazine 500 MG 12 hr tablet Commonly known as:  RANEXA Take 1 tablet (500 mg total) by mouth 2 (two) times daily.   sildenafil 20 MG tablet Commonly known as:  REVATIO Take 1  tablet (20 mg total) by mouth 3 (three) times daily.   tamsulosin 0.4 MG Caps capsule Commonly known as:  FLOMAX Take 1 capsule (0.4 mg total) by mouth daily.   warfarin 2 MG tablet Commonly known as:  COUMADIN Take as directed. If you are unsure how to take this medication, talk to your nurse or doctor. Original instructions:  Take 2-4 mg by mouth daily. Take 2 mg (1 tablet) daily except 4 mg (2 tablets) on Tues   zinc gluconate 50 MG tablet Take 50 mg by mouth 2 (two) times daily.       Disposition   The patient will be discharged in stable condition to home.  Discharge Instructions    (HEART FAILURE PATIENTS) Call MD:  Anytime you have any of the following symptoms: 1) 3 pound weight gain in 24 hours or 5 pounds in 1 week 2) shortness of breath, with or without a  dry hacking cough 3) swelling in the hands, feet or stomach 4) if you have to sleep on extra pillows at night in order to breathe.   Complete by:  As directed    Diet - low sodium heart healthy   Complete by:  As directed    Increase activity slowly   Complete by:  As directed      Follow-up Information    [facility] Follow up on 12/23/2017.   Specialty:  Cardiology Why:  at 2 pm for post hospital follow up. The code for parking is 9001. Contact information: [address] [id number] [place] [phone]            Duration of Discharge Encounter: Greater than 35 minutes   Signed, Annamaria Helling  12/17/2017, 12:20 PM  Patient seen and examined with the above-signed Advanced Practice Provider and/or Housestaff. I personally reviewed laboratory data, imaging studies and relevant notes. I independently examined the patient and formulated the important aspects of the plan. I have edited the note to reflect any of my changes or salient points. I have personally discussed the plan with the patient and/or  family.  Renal function continues to improve. Urine now clear. INR > 2. VAD parameters stable (interrogated personally). Will let him go today with close f/u in VAD clinic and Urology. Discussed with Dr. Gloriann Loan in Urology.   Glori Bickers, MD  3:54 PM

## 2017-12-17 NOTE — Progress Notes (Signed)
Patient reviewed discharge instructions and RX.  Midline catheter removed without complication.  Patient and all belongings transported to personal vehicle for discharge home.

## 2017-12-17 NOTE — Progress Notes (Signed)
Patient ID: [name], male   DOB: 1941/11/02, 77 y.o.   MRN: [id number] [name] Progress Note   Assessment/ Plan:   1. 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 Urine output stable with some improvement of creatinine noted consistent with trend. No indication for dialysis at this time after he received a single treatment earlier this week for uremic symptoms. Cr continues to trend downward.  -daily labs -outpatient f/u Cr   2. Bilateral hydronephrosis/bladder tumor (high-grade urothelial cancer): Ongoing close follow-up by urology and plans noted for discontinuation of Foley catheter today-hematuria appears to have resolved (now off of heparin drip with an INR of 2.0).  3. 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  4. Hyponatremia: Secondary to history of congestive heart failure and likely compounded by recent acute kidney injury/free water excretion defect. Appears corrected at this time with improving urine output.  5. Anemia: Secondary to chronic kidney disease/chronic illness and compounded by recent hematuria-we'll continue to follow and avoid ESA at this time.   Subjective:   Patient feels well. Would like to go home today.    Objective:   BP 109/81   Pulse (!) 56   Temp 98.2 F (36.8 C) (Oral)   Resp 11   Ht 6' (1.829 m)   Wt 197 lb 12 oz (89.7 kg)   SpO2 94%   BMI 26.82 kg/m   Intake/Output Summary (Last 24 hours) at 12/17/2017 0753 Last data filed at 12/17/2017 0447 Gross per 24 hour  Intake 740 ml  Output 1750 ml  Net -1010 ml   Weight change: 7.1 oz (0.2 kg)  Physical Exam: Gen: Comfortably resting in bed CVS: Pulse regular rhythm, normal rate, mechanical heart sounds with LVAD hum Resp: Decreased breath sounds at the bases, no  distinct rales or rhonchi. Right IJ temporary hemodialysis catheter in place Abd: Soft, flat, nontender Ext: No lower extremity edema noted.  Imaging: No results found.  Labs: BMET Recent Labs  Lab 12/11/17 0827 12/12/17 0624 12/13/17 0500 12/14/17 0501 12/15/17 0440 12/16/17 0630  NA 134* 137 135 134* 137 137  K 4.5 4.6 4.6 4.6 4.4 4.6  CL 97* 100* 99* 99* 102 103  CO2 24 25 25 23 24 24   GLUCOSE 145* 113* 115* 138* 114* 112*  BUN 35* 36* 33* 35* 37* 36*  CREATININE 3.15* 2.80* 2.39* 3.21* 2.92* 2.37*  CALCIUM 8.1* 8.2* 8.3* 8.3* 8.3* 8.5*   CBC Recent Labs  Lab 12/13/17 0500 12/14/17 0501 12/15/17 0440 12/16/17 0630  WBC 5.5 6.9 4.7 5.7  NEUTROABS 3.6 4.9 3.0 3.9  HGB 9.0* 8.9* 8.4* 8.7*  HCT 29.3* 28.9* 27.4* 28.4*  MCV 89.3 90.0 89.3 89.6  PLT 226 216 214 219    Medications:    . amiodarone  200 mg Oral Daily  . atorvastatin  80 mg Oral q1800  . docusate sodium  200 mg Oral Daily  . levothyroxine  150 mcg Oral QAC breakfast  . mouth rinse  15 mL Mouth Rinse BID  . pantoprazole  40 mg Oral Daily  . ranolazine  500 mg Oral BID  . sildenafil  20 mg Oral TID  . sodium chloride flush  3 mL Intravenous Q12H  . tamsulosin  0.4 mg Oral Daily  . warfarin  2 mg Oral Once per day  on Sun Mon Wed Thu Fri Sat  . warfarin  4 mg Oral Q Tue-1800  . Warfarin - Pharmacist Dosing Inpatient   Does not apply q1800   Ralene Ok, MD 12/17/17  I have seen and examined this patient and agree with plan and assessment in the above note with renal recommendations/intervention highlighted.  Will need ongoing follow up with Urology and as needed with our office but hopefully his renal function will continue to improve. Broadus John A Lacee Grey,MD 12/17/2017 12:22 PM

## 2017-12-17 NOTE — Discharge Instructions (Signed)

## 2017-12-17 NOTE — Progress Notes (Signed)
LVAD Coordinator Rounding Note:  Admitted 11/29/17 due for elective ureteroscopy due to right ureteral stone.   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 Cyndia Bent with oversewn aortic valveunder DT criteria.   Vital signs: Temp: 97.8 HR:60 Doppler Pressure:84 Automatic BP:109/91 (98) O2 Sat: 92% on RA   Weight: 194>191>196>197>196>197>199>200>197>195>195>194>196>197>197lbs  GTTS: Milrinone 0.1 mcg/kg/min- stopped 12/10/17 Heparin500 units/hr- stopped 12/15/17  LVAD interrogation reveals:  Speed: 8800 Flow:3.8 Power: 4w PI: 8.3 Alarms: none Events:1 PI events overnight  Fixed speed: 8800 Low speed limit: 8200  Drive Line: Sorbaview dressing dry and intact; anchor intact and accurately applied. Weekly dressing changes;Last changed 12/15/17 by [name], his partner.Next change is due 12/22/17.  Labs:  LDH trend: 362>332>270>275>280>269>285>319>259>253>288>272>320>275>246>259  INR trend: 2.20>2.12>1.89>1.90>1.75>1.58>1.48>1.33>1.27>2.11>2.35>1.85>1.54>1.45>1.59<2.06> 2.25  Creat trend: 1.46>1.33>2.21>2.46>6.25>2.8>2.39>3.21>2.92>2.37  Anticoagulation Plan: - INR Goal: 2.0 - 2.5 - ASA Dose: no ASA (removed 02/2017 due to GI bleed)  Device: -Dual Medtronic ICD -Therapies: on at 200 bpm - Last device check 10/02/17  Significant Events on VAD Support:  09/01/14>>ureteral stone with cystoscopy and flexible ureteroscopy with lithotripsy and basket extraction 02/2015 >>GIB with 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 Started on Plavix 3/17>>Lumbar fusion L3-L5 9/17>>VT with DC-CV; amio increased 10/17>>ramp with RV failure; speed decreased to 8800 02/2017> GIB (removed ASA, scopes negative) 03/2017>percutaneous lead repair after pump stops  10/18>>Slow VT. Cardioverted in ED and amiodarone  uptitrated 12/18>>melena with scope. Hgb 6.5. EGD with 2 AVMs with APC 12/18>>recurrent hematuria. 6 mm left ureteral stone 1/19>>7 mm left ureteral stone removal with laser lithotripsy; left ureteral stent. Resection of 6 cm bladder tumor   Plan/Recommendations:   1. Please call VAD pager if any issues with VAD equipment or questions, concerns. 2. Hospital f/u appointment has been scheduled in the event he is discharged.   Balinda Quails RN, VAD Coordinator 24/7 pager [phone]

## 2017-12-17 NOTE — Progress Notes (Signed)
ANTICOAGULATION CONSULT NOTE - Follow Up Consult  Pharmacy Consult for Heparin and Warfarin Indication: LVAD  Allergies  Allergen Reactions  . Demerol [Meperidine] Other (See Comments)    Paralysis. Could only move eyes.     Patient Measurements: Height: 6' (182.9 cm) Weight: 197 lb 12 oz (89.7 kg) IBW/kg (Calculated) : 77.6  Vital Signs: Temp: 97.8 F (36.6 C) (02/12 0754) Temp Source: Oral (02/12 0754) BP: 109/91 (02/12 0754) Pulse Rate: 58 (02/12 0754)  Labs: Recent Labs    12/15/17 0440 12/16/17 0630 12/17/17 0755  HGB 8.4* 8.7* 8.5*  HCT 27.4* 28.4* 27.5*  PLT 214 219 221  LABPROT 23.1* 24.7* 26.4*  INR 2.06 2.25 2.45  CREATININE 2.92* 2.37* 1.90*    Estimated Creatinine Clearance: 35.2 mL/min (A) (by C-G formula based on SCr of 1.9 mg/dL (H)).  Assessment: 15 yoM with LVAD on warfarin at home admitted for RHC and ureteroscopy. INR 2.2 on admit, warfarin held, and he was started on heparin once INR < 2.0. S/P cystoscopy + ureteroscopy with stent placement 1/30. Warfarin resumed 1/31. "Low dose" heparin resumed 2/1.   Anticoagulation held 2/4 for HD catheter placement. Underwent HD x 1 on 2/5. SCr improving and no further HD planned. No further interventions planned per urology either.   INR today increased from 2.25 to 2.45 - likely related to previous 6 mg dose x2 to get INR into goal range. Hgb is stable at 8.5, plts are WNL. LDH is stable at 259. No further hematuria- no s/sx of bleeding.  Home Warfarin Dose: 4mg  Tues/2mg  all other days  Goal of Therapy:  INR 2-2.5 Monitor platelets by anticoagulation protocol: Yes   Plan:  1) Order warfarin 1 mg prior to discharge 2) Resume home warfarin regimen of 2 mg daily except 4 mg on Tuesday starting tomorrow 3) Follow up appointment with LVAD clinic for INR check on 12/23/2017  Doylene Canard, PharmD Clinical Pharmacist  Pager: [phone] Clinical Phone for 12/17/2017 until 3:30pm: [phone] If after 3:30pm,  please call main pharmacy at [phone] 12/17/2017 10:29 AM

## 2017-12-18 ENCOUNTER — Ambulatory Visit (HOSPITAL_COMMUNITY): Payer: Self-pay | Admitting: Pharmacist

## 2017-12-18 LAB — POCT INR: INR: 2.3

## 2017-12-20 ENCOUNTER — Telehealth (HOSPITAL_COMMUNITY): Payer: Self-pay | Admitting: Pharmacist

## 2017-12-20 ENCOUNTER — Encounter (HOSPITAL_COMMUNITY): Payer: Self-pay | Admitting: Pharmacist

## 2017-12-20 NOTE — Telephone Encounter (Signed)
Sildenafil PA denied by [facility] Part D 2/2 not being used for an FDA approved indication. Will send appeal.   Ruta Hinds. [name], PharmD, BCPS, CPP Clinical Pharmacist Phone: [phone] 12/20/2017 11:58 AM

## 2017-12-23 ENCOUNTER — Encounter (HOSPITAL_COMMUNITY): Payer: Self-pay

## 2017-12-23 ENCOUNTER — Other Ambulatory Visit (HOSPITAL_COMMUNITY): Payer: Self-pay | Admitting: *Deleted

## 2017-12-23 ENCOUNTER — Ambulatory Visit (HOSPITAL_COMMUNITY)
Admission: RE | Admit: 2017-12-23 | Discharge: 2017-12-23 | Disposition: A | Discharge status: Home / Self Care | Payer: Medicare Other | Source: Ambulatory Visit | Attending: Internal Medicine | Admitting: Internal Medicine

## 2017-12-23 ENCOUNTER — Ambulatory Visit (HOSPITAL_COMMUNITY): Payer: Self-pay | Admitting: Pharmacist

## 2017-12-23 ENCOUNTER — Encounter (HOSPITAL_COMMUNITY): Payer: Medicare Other

## 2017-12-23 VITALS — BP 68/0 | Ht 72.0 in | Wt 202.0 lb

## 2017-12-23 DIAGNOSIS — M4856XA Collapsed vertebra, not elsewhere classified, lumbar region, initial encounter for fracture: Secondary | ICD-10-CM | POA: Diagnosis not present

## 2017-12-23 DIAGNOSIS — I5022 Chronic systolic (congestive) heart failure: Secondary | ICD-10-CM | POA: Diagnosis not present

## 2017-12-23 DIAGNOSIS — I472 Ventricular tachycardia, unspecified: Secondary | ICD-10-CM

## 2017-12-23 DIAGNOSIS — I132 Hypertensive heart and chronic kidney disease with heart failure and with stage 5 chronic kidney disease, or end stage renal disease: Secondary | ICD-10-CM | POA: Insufficient documentation

## 2017-12-23 DIAGNOSIS — Z95811 Presence of heart assist device: Secondary | ICD-10-CM | POA: Diagnosis not present

## 2017-12-23 DIAGNOSIS — D494 Neoplasm of unspecified behavior of bladder: Secondary | ICD-10-CM

## 2017-12-23 DIAGNOSIS — Z8673 Personal history of transient ischemic attack (TIA), and cerebral infarction without residual deficits: Secondary | ICD-10-CM | POA: Insufficient documentation

## 2017-12-23 DIAGNOSIS — Z79899 Other long term (current) drug therapy: Secondary | ICD-10-CM | POA: Diagnosis not present

## 2017-12-23 LAB — COMPREHENSIVE METABOLIC PANEL
ALBUMIN: 3.3 g/dL — AB (ref 3.5–5.0)
ALT: 14 U/L — ABNORMAL LOW (ref 17–63)
ANION GAP: 11 (ref 5–15)
AST: 26 U/L (ref 15–41)
Alkaline Phosphatase: 90 U/L (ref 38–126)
BILIRUBIN TOTAL: 0.6 mg/dL (ref 0.3–1.2)
BUN: 32 mg/dL — ABNORMAL HIGH (ref 6–20)
CO2: 21 mmol/L — ABNORMAL LOW (ref 22–32)
Calcium: 8.6 mg/dL — ABNORMAL LOW (ref 8.9–10.3)
Chloride: 104 mmol/L (ref 101–111)
Creatinine, Ser: 1.58 mg/dL — ABNORMAL HIGH (ref 0.61–1.24)
GFR calc Af Amer: 47 mL/min — ABNORMAL LOW (ref >60)
GFR, EST NON AFRICAN AMERICAN: 40 mL/min — AB (ref >60)
GLUCOSE: 116 mg/dL — AB (ref 65–99)
Potassium: 4.1 mmol/L (ref 3.5–5.1)
Sodium: 136 mmol/L (ref 135–145)
TOTAL PROTEIN: 6.3 g/dL — AB (ref 6.5–8.1)

## 2017-12-23 LAB — CBC
HCT: 25.2 % — ABNORMAL LOW (ref 39.0–52.0)
Hemoglobin: 8.1 g/dL — ABNORMAL LOW (ref 13.0–17.0)
MCH: 29.1 pg (ref 26.0–34.0)
MCHC: 32.1 g/dL (ref 30.0–36.0)
MCV: 90.6 fL (ref 78.0–100.0)
Platelets: 208 10*3/uL (ref 150–400)
RBC: 2.78 MIL/uL — ABNORMAL LOW (ref 4.22–5.81)
RDW: 19.3 % — AB (ref 11.5–15.5)
WBC: 5.9 10*3/uL (ref 4.0–10.5)

## 2017-12-23 LAB — PROTIME-INR
INR: 2.09
Prothrombin Time: 23.3 seconds — ABNORMAL HIGH (ref 11.4–15.2)

## 2017-12-23 LAB — LACTATE DEHYDROGENASE: LDH: 261 U/L — AB (ref 98–192)

## 2017-12-23 NOTE — Progress Notes (Signed)
Patient presents for hospital  fu in [facility] today. Reports no problems with VAD equipment or concerns with drive line.     Pts flow is low and PI today. After drinking 1 bottle of water the flow and PI increased. Pt was asymptomatic.  Vital Signs:  Doppler Pressure: 68  Automatc BP: 97/55 (71) HR: 62 SPO2:97  %  Weight: 202 lb w/o eqt Last weight: 197 lb  VAD Indication: Destination Therapy- age excluding    VAD interrogation & Equipment Management: Speed:8800 Flow: 2.9 Power:3.9 w    PI: 3.8  Alarms: no clinical alarms Events: rare PI event  Fixed speed 8800 Low speed limit: 8200  Exit Site Care: Drive line is being maintained weekly by [name]. Drive line exit site well healed and incorporated. The velour is fully implanted at exit site. Dressing dry and intact. No erythema or drainage. Stabilization device present and accurately applied. Pt denies fever or chills. Pt states they do not need dressing kits at this visit.  Significant Events on VAD Support:  02/2015 >>GIB (removed ASA, 2-2.5 INR) 10/2015 >>CVA(Plavix started) 02/2017> GIB (removed ASA, scopes negative) 03/2017> percutaneous lead repair 12/2017> Bladder CA  Device:Medtronic Therapies: on Last check: today in clinic. No record of arrhythmia. Optivol elevated.  followed at device clinic, Dr Caryl Comes  BP & Labs:  MAP 71 - Doppler is reflecting MAP  Hgb 8.1 - No S/S of bleeding. Specifically denies melena/BRBPR or nosebleeds. No blood in urine.  LDH stable at 261 with established baseline of 280- 380. Denies tea-colored urine. No power elevations noted on interrogation.   Plan: 1. Rescheduled octreotide for 2/27 at 2pm. 2. Return to clinic in 1 month.     Tanda Rockers RN Sandwich Coordinator   Office: [phone] 24/7 Emergency VAD Pager: [phone]

## 2017-12-24 NOTE — Progress Notes (Signed)
Patient ID: [name], male   DOB: 1941/06/13, 77 y.o.   MRN: [id number]   VAD CLINIC PROGRESS NOTE  PCP: Dr. Kandice Robinsons ([place]) GU: Dr Risa Grill  Neuro Surgeon: Dr Vertell Limber Vascular: Dr Kellie Simmering Primary HF: Dr Haroldine Laws  [name] is a 77 yo male with h/o VT, PAD and severe systolic HF (EF 54-00%) due to mixed ischemic/nonischemic CM he is s/p HM II LVAD implant for destination therapy on October 19, 2013.VAD implantation was complicated by VT, syncope, AF/Aflutter and RHF. Required short term milrinone.    GU Event 09/01/14 had impacted ureteral stone and underwent cystoscopy and flexible ureteroscopy with lithotripsy and basket extraction.   09/2015 Hematuria and chronic nephrolithiasis. Renal US showed mild hyronephrosis and bilateral renal cysts. Dr. Risa Grill took back for repeat lithotripsy and stone extraction by ureteroscopy and J stent placement. 12/18 Recurrent hematuria. 66mm left ureteral stone. Passed with tamulosin 1/19   Admitted with recurrent hematuria with ureteral stone. Stone removed but found to have large bladder mass which was removed. Course c/b obstructive uropathy and AKI requiring 1 session of HD.   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  4/2-18 - Episode of melena. Hgb 10.9-> 8.7. EGD/colonoscopy negative 12/18 - Melena with syncope  Hgb 6.5. Transfused 5u. 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  01/2016 R CEA. He was started on Plavix.  01/2016 Lumbar fusion L3-L5 with pedicle screws posterolateral arthrodesis with BMP, allograft  Admitted 9/17 with high fevers and productive cough. CXR negative. Treated with levaquin x 7 days for acute bronchitis. Also found to have VT and underwent DC-CV. Seen by EP and amio increased  Had ramp echo in 10/17 speed turned down to 8800 due to RV failure.  Admitted 4/18 with melena and drop in Hgb. Transfused 2u. EGD/colon  negative.  Admitted 5/18 with pump stops. 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  Admitted 10/13-15/18 with slow VT and ADHF.Cardioverted in ED and amio uptitrated. Also diuresed. Saw Dr. Caryl Comes earlier this week and amio decreased.   Admitted 12/10-18/18 with syncope and recurrent GIB. Hgb 6.5. Transfused 5u. Found to have 2 AVMs and underwent APC. Also developed hematuria and CT revealed left ureteral stone (54mm). Passed with tamulosin  Follow up LVAD Clinic: He returns today for post-hospital visit.  Admitted 1/25-2/12/19 with recurrent hematuria with ureteral stone. Stone removed but found to have large bladder mass which was removed. Course c/b obstructive uropathy and AKI requiring 1 session of HD.  Had [place] during the admission as well and had mildly low output. Started on milrinone without benefit. Feels much better. Urine is clear. Still weak but energy improving. PI and flows were low on arrival and given a bottle of water.   No flank pain. Denies orthopnea or PND. No fevers, chills or problems with driveline. No bleeding, melena or neuro symptoms. No VAD alarms. Taking all meds as prescribed.     VAD Indication: Destination Therapy- age excluding    VAD interrogation & Equipment Management: Speed:8800 Flow: 2.9 Power:3.9 w PI: 3.8  Alarms: no clinical alarms Events: rare PI event  Fixed speed 8800 Low speed limit: 8200  Exit Site Care: Drive line is being maintained weekly by [name]. Drive line exit site well healed and incorporated. The velour is fully implanted at exit site. Dressing dry and intact. No erythema or drainage. Stabilization device  present and accurately applied. Pt denies fever or chills. Pt given 6 weekly dressing kits at this visit.  62 Significant Events on VAD Support:  02/2015 >>GIB (removed ASA, 2-2.5 INR) 10/2015 >>CVA(Plavix started) 02/2017> GIB (removed ASA, scopes negative) 03/2017>percutaneous lead  repair   Past Medical History:  Diagnosis Date  . Arthritis   . Automatic implantable cardioverter-defibrillator in situ    a. s/p Medtronic Evera device serial [id number]    . Bradycardia   . CAD (coronary artery disease)    a. s/p MI 1982;  s/p DES to CFX 2011;  s/p NSTEMI 4/12 in setting of VTach;  cath 7/12:  LM ok, LAD mid 30-40%, Dx's with 40%, mCFX stent patent, prox to mid RCA occluded with R to R and L to R collats.  Medical Tx was continued  . Chronic systolic heart failure ([place])    a. s/p LVAD 10/2013 for destination therapy  . CKD (chronic kidney disease)   . CVD (cerebrovascular disease)    a. s/p L CEA 2008  . Cytopenia   . Dysrhythmia    AFIB  . History of GI bleed    a. on ASA- started on plavix during 10/2015 admission for CVA  . HLD (hyperlipidemia)   . HTN (hypertension)   . Hypothyroidism   . Inappropriate therapy from implantable cardioverter-defibrillator    a. ATP for sinus tach SVT wavelet identified SVT but was no  passive (2014)  . Ischemic cardiomyopathy   . 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 L Ear   . Myocardial infarction ([place])    1992  . PAF (paroxysmal atrial fibrillation) ([place])   . PVD (peripheral vascular disease) ([place])    a. 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 Shortness of breath dyspnea    W/ EXERTION   . Sleep apnea    NO CPAP NOW  HE SENT IT BACK YRS AGO  . Stroke [facility])    a. 10/2015 admission   . Ventricular tachycardia ([place])    a. s/p AICD;   h/o ICD shock;  Amiodarone Rx. S/P ablation in 2012    Current Outpatient Medications  Medication Sig Dispense Refill  . amiodarone (PACERONE) 200 MG tablet Take 200 mg by mouth daily.    [name] atorvastatin (LIPITOR) 80 MG tablet TAKE 1 TABLET DAILY AT 6 PM (Patient taking differently: TAKE 1 TABLET (80 mg) DAILY in the morning) 30 tablet 11  . docusate sodium (COLACE) 100 MG capsule Take 200 mg by mouth  every morning.     [name] levothyroxine (SYNTHROID, LEVOTHROID) 150 MCG tablet Take 1 tablet (150 mcg total) by mouth daily before breakfast. 90 tablet 3  . Multiple Vitamins-Minerals (ICAPS PO) Take 1 tablet by mouth 2 (two) times daily.    . pantoprazole (PROTONIX) 40 MG tablet Take 1 tablet (40 mg total) by mouth 2 (two) times daily. 180 tablet 6  . ranolazine (RANEXA) 500 MG 12 hr tablet Take 1 tablet (500 mg total) by mouth 2 (two) times daily. 60 tablet 6  . sildenafil (REVATIO) 20 MG tablet Take 1 tablet (20 mg total) by mouth 3 (three) times daily. 90 tablet 3  . tamsulosin (FLOMAX) 0.4 MG CAPS capsule Take 1 capsule (0.4 mg total) by mouth daily. 30 capsule 3  . warfarin (COUMADIN) 2 MG tablet Take 2-4 mg by  mouth daily. Take 2 mg (1 tablet) daily except 4 mg (2 tablets) on Tues    . zinc gluconate 50 MG tablet Take 50 mg by mouth 2 (two) times daily.     . furosemide (LASIX) 20 MG tablet Take 20 mg by mouth daily as needed for fluid.    . nitroGLYCERIN (NITROSTAT) 0.4 MG SL tablet Place 0.4 mg under the tongue every 5 (five) minutes as needed for chest pain.     No current facility-administered medications for this encounter.     Demerol [meperidine]  REVIEW OF SYSTEMS: All systems negative except as listed in HPI, PMH and Problem list.  Vital Signs:  Doppler Pressure: 68   Automatc BP: 97/55 (71) HR: 62 SPO2:97  %  Weight: 202 lb w/o eqt Last weight: 197 lb  Physical Exam: General:  Elderly. Looks good NAD.  HEENT: normal  Neck: supple. JVP 6-7.  Carotids 2+ bilat; no bruits. No lymphadenopathy or thryomegaly appreciated. Cor: LVAD hum.  Lungs: Clear. Abdomen: obese soft, nontender, non-distended. No hepatosplenomegaly. No bruits or masses. Good bowel sounds. Driveline site clean. Anchor in place.  Extremities: no cyanosis, clubbing, rash. Warm no edema  Neuro: alert & oriented x 3. No focal deficits. Moves all 4 without problem    ASSESSMENT AND PLAN:  1. Chronic  systolic HF: s/p HM-II LVAD 10/2014.  - Recent RHC in 1/19 with mildly low output. Started on milrinone without benefit. Milrinone stopped. Now improved.  - NYHA II today. Volume status was low on arrival. Given water to drink. Now better - Maintaining NSR - VAD interrogated personally. Flows and PI low on arrival. Given fluids. Encouraged po intake.  (LVAD speed reduced to 8800 in 10/17 due to RV failure) - s/p previous driveline repair. No further pump stops 3. 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 Will f/u with Dr. Gloriann Loan for possible BCG infusion 4. Acute on CKD, stage III:   - recent ESRD in setting of obstructive uropathy and ATN - creatinine improved to 1.58 today 5. 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 Continue coumadin and plavix.(with h/o CVA). - Admitted 4/18 with recurrent GIB. Transfused 2u. EGD/colon negative - Admitted 12./18. hgb 6.5, Transfused 5u. EGD with 2 AVMs treated with APC - No further bleeding. Hgb 8.1 today 6. Atrial arrhythmias: Atrial fibrillation, atrial tachycardia.  S/p DC-CV 06/04/14, DC-CV 03/02/15 and 01/19/16.  - Remains in NSR.  Continue low-dose amio.  7. HTN:  - Blood pressure well controlled. Continue current regimen. 8. LVAD:  - VAD interrogated personally. Parameters stable..  - LDH 3261 9. Anticoagulation management:  - Goal INR 2-2.5 INR 2.09 Discussed dosing with PharmD personally. 10. Lumbar compression fracture:  s/p L3-L5 fusion 02/03/16. - back pain resolved 11. 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  Continue atorvastatin. (also warfarin INR goal 2-2.5). S/p R CEA 01/13/16. Plavix stopped 12/18 due to recurrent GIB 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 Forde Dandy in past.  - Amio restarted due to recurrent atrial arrhythmias and VT.  - Recent FT4 elevated. Synthroid cut to 150 daily in 12/18. Recheck next visit.  May be contributing to fatigue.   13. VT  - underwent DC-CV 9/17.  - repeat episode of slow VT 08/17/17. Requiring DC-CV. Amio increased for 1 week  - continue amio 200 daily - Keep K > 4.0 Mg > 2.0 - Remains in  NSR on ECG and AliveCor device. Will continue to monitor   Total time spent 35 minutes. Over half that time spent discussing above.   Winnie Umali,MD 5:27 PM

## 2017-12-26 ENCOUNTER — Ambulatory Visit (HOSPITAL_COMMUNITY): Payer: Self-pay | Admitting: Pharmacist

## 2017-12-26 LAB — POCT INR: INR: 2

## 2017-12-30 ENCOUNTER — Telehealth (HOSPITAL_COMMUNITY): Payer: Self-pay | Admitting: Unknown Physician Specialty

## 2017-12-30 MED ORDER — OSELTAMIVIR PHOSPHATE 30 MG PO CAPS: 30.0000 mg | ORAL_CAPSULE | Freq: Two times a day (BID) | ORAL | 0 refills | Status: DC

## 2017-12-30 NOTE — Telephone Encounter (Signed)
pts caregiver called the office stating that their grandson was diagnosed with the flu yesterday. Ruford spent several hours with him on Saturday. Per Dr. Haroldine Laws we will send the pt a script for Tamiflu 30 mg BID for 5 days.  Tanda Rockers RN, BSN VAD Coordinator 24/7 Pager [phone]

## 2018-01-01 ENCOUNTER — Ambulatory Visit (HOSPITAL_COMMUNITY): Payer: Self-pay | Admitting: Pharmacist

## 2018-01-01 ENCOUNTER — Encounter (HOSPITAL_COMMUNITY)
Admission: RE | Admit: 2018-01-01 | Discharge: 2018-01-01 | Disposition: A | Discharge status: Home / Self Care | Payer: Medicare Other | Source: Ambulatory Visit | Attending: Internal Medicine | Admitting: Internal Medicine

## 2018-01-01 ENCOUNTER — Ambulatory Visit (INDEPENDENT_AMBULATORY_CARE_PROVIDER_SITE_OTHER): Payer: Medicare Other | Admitting: *Deleted

## 2018-01-01 DIAGNOSIS — I472 Ventricular tachycardia, unspecified: Secondary | ICD-10-CM

## 2018-01-01 DIAGNOSIS — Z8719 Personal history of other diseases of the digestive system: Secondary | ICD-10-CM | POA: Insufficient documentation

## 2018-01-01 DIAGNOSIS — Z95811 Presence of heart assist device: Secondary | ICD-10-CM | POA: Insufficient documentation

## 2018-01-01 LAB — POCT INR: INR: 1.9

## 2018-01-01 MED ORDER — OCTREOTIDE ACETATE 20 MG IM KIT
20.0000 mg | PACK | INTRAMUSCULAR | Status: DC
  Administered 2018-01-01: 20 mg via INTRAMUSCULAR
  Filled 2018-01-01 (×2): qty 1

## 2018-01-01 NOTE — Progress Notes (Signed)
Remote ICD transmission.   

## 2018-01-02 ENCOUNTER — Encounter: Payer: Self-pay | Admitting: Cardiology

## 2018-01-07 ENCOUNTER — Telehealth (HOSPITAL_COMMUNITY): Payer: Self-pay | Admitting: *Deleted

## 2018-01-07 NOTE — Telephone Encounter (Signed)
Called patient regarding urologist recommendations for bladder cancer treatment. Per [name], patients significant other, urologist recommends to wait 3 months then re scope to assess bladder.  Per Dr Haroldine Laws, patient should consider more aggressive options. Personally spoke with [name] and Iona Beard who will make an appointment with the urologist to discuss further including more aggressive teratment options.   Balinda Quails RN, VAD Coordinator 24/7 pager [phone]

## 2018-01-08 ENCOUNTER — Telehealth (HOSPITAL_COMMUNITY): Payer: Self-pay | Admitting: *Deleted

## 2018-01-08 NOTE — Telephone Encounter (Signed)
Wife called VAD clinic to report patient has an abdominal "knot" that appeared 3 inches lateral to his drive line exit site yesterday. Pt is c/o pain at this site. She is worried the VAD drive line has moved. She denies any trauma to drive line, pt is having no additional drainage, fever, or chills. She feels he needs to be seen in VAD clinic either Thurs or Friday.  Dr. Kendrick Ranch updated - will see patient in clinic either Thurs or Friday to check site. [name] notified - appt set for Friday at 9 am.

## 2018-01-09 ENCOUNTER — Ambulatory Visit (HOSPITAL_COMMUNITY): Payer: Self-pay | Admitting: Pharmacist

## 2018-01-09 ENCOUNTER — Other Ambulatory Visit (HOSPITAL_COMMUNITY): Payer: Self-pay | Admitting: *Deleted

## 2018-01-09 DIAGNOSIS — Z95811 Presence of heart assist device: Secondary | ICD-10-CM

## 2018-01-09 LAB — POCT INR: INR: 2.5

## 2018-01-10 ENCOUNTER — Ambulatory Visit (HOSPITAL_COMMUNITY)
Admission: RE | Admit: 2018-01-10 | Discharge: 2018-01-10 | Disposition: A | Discharge status: Home / Self Care | Payer: Medicare Other | Source: Ambulatory Visit | Attending: Cardiology | Admitting: Cardiology

## 2018-01-10 VITALS — BP 110/0 | HR 63 | Ht 72.0 in | Wt 203.0 lb

## 2018-01-10 DIAGNOSIS — E039 Hypothyroidism, unspecified: Secondary | ICD-10-CM | POA: Insufficient documentation

## 2018-01-10 DIAGNOSIS — Z95811 Presence of heart assist device: Secondary | ICD-10-CM | POA: Diagnosis present

## 2018-01-10 DIAGNOSIS — R222 Localized swelling, mass and lump, trunk: Secondary | ICD-10-CM | POA: Diagnosis not present

## 2018-01-10 DIAGNOSIS — M4856XA Collapsed vertebra, not elsewhere classified, lumbar region, initial encounter for fracture: Secondary | ICD-10-CM | POA: Diagnosis not present

## 2018-01-10 DIAGNOSIS — I2510 Atherosclerotic heart disease of native coronary artery without angina pectoris: Secondary | ICD-10-CM | POA: Insufficient documentation

## 2018-01-10 DIAGNOSIS — I471 Supraventricular tachycardia: Secondary | ICD-10-CM | POA: Diagnosis not present

## 2018-01-10 DIAGNOSIS — I252 Old myocardial infarction: Secondary | ICD-10-CM | POA: Insufficient documentation

## 2018-01-10 DIAGNOSIS — Z8673 Personal history of transient ischemic attack (TIA), and cerebral infarction without residual deficits: Secondary | ICD-10-CM | POA: Diagnosis not present

## 2018-01-10 DIAGNOSIS — I480 Paroxysmal atrial fibrillation: Secondary | ICD-10-CM | POA: Diagnosis not present

## 2018-01-10 DIAGNOSIS — I4891 Unspecified atrial fibrillation: Secondary | ICD-10-CM | POA: Insufficient documentation

## 2018-01-10 DIAGNOSIS — Z79899 Other long term (current) drug therapy: Secondary | ICD-10-CM | POA: Diagnosis not present

## 2018-01-10 DIAGNOSIS — I255 Ischemic cardiomyopathy: Secondary | ICD-10-CM | POA: Insufficient documentation

## 2018-01-10 DIAGNOSIS — Z7902 Long term (current) use of antithrombotics/antiplatelets: Secondary | ICD-10-CM | POA: Insufficient documentation

## 2018-01-10 DIAGNOSIS — I472 Ventricular tachycardia, unspecified: Secondary | ICD-10-CM

## 2018-01-10 DIAGNOSIS — Z9889 Other specified postprocedural states: Secondary | ICD-10-CM | POA: Insufficient documentation

## 2018-01-10 DIAGNOSIS — N186 End stage renal disease: Secondary | ICD-10-CM | POA: Diagnosis not present

## 2018-01-10 DIAGNOSIS — Z79890 Hormone replacement therapy (postmenopausal): Secondary | ICD-10-CM | POA: Insufficient documentation

## 2018-01-10 DIAGNOSIS — E785 Hyperlipidemia, unspecified: Secondary | ICD-10-CM | POA: Insufficient documentation

## 2018-01-10 DIAGNOSIS — I5022 Chronic systolic (congestive) heart failure: Secondary | ICD-10-CM | POA: Diagnosis not present

## 2018-01-10 DIAGNOSIS — G4730 Sleep apnea, unspecified: Secondary | ICD-10-CM | POA: Insufficient documentation

## 2018-01-10 DIAGNOSIS — K922 Gastrointestinal hemorrhage, unspecified: Secondary | ICD-10-CM | POA: Insufficient documentation

## 2018-01-10 DIAGNOSIS — Z7901 Long term (current) use of anticoagulants: Secondary | ICD-10-CM | POA: Insufficient documentation

## 2018-01-10 DIAGNOSIS — D494 Neoplasm of unspecified behavior of bladder: Secondary | ICD-10-CM | POA: Diagnosis not present

## 2018-01-10 DIAGNOSIS — I132 Hypertensive heart and chronic kidney disease with heart failure and with stage 5 chronic kidney disease, or end stage renal disease: Secondary | ICD-10-CM | POA: Diagnosis not present

## 2018-01-10 DIAGNOSIS — N200 Calculus of kidney: Secondary | ICD-10-CM | POA: Insufficient documentation

## 2018-01-10 DIAGNOSIS — R1900 Intra-abdominal and pelvic swelling, mass and lump, unspecified site: Secondary | ICD-10-CM | POA: Diagnosis not present

## 2018-01-10 DIAGNOSIS — I739 Peripheral vascular disease, unspecified: Secondary | ICD-10-CM | POA: Diagnosis not present

## 2018-01-10 NOTE — Progress Notes (Signed)
Patient presents for sick visit in [facility] today with caregiver, [name]. She and patient report "feeling the internal drive line" for the first time over last few days. Pt denies any trauma to site or changes to daily activities or sleeping habits. Pt says he has mild pain "at times".   Palpated drive line today - no tenderness, erythema, or drainage from exit site noted. Dr. Haroldine Laws will assess. Reports no problems with VAD equipment or concerns with drive line exit site.     Pt also reports he will start chemo treatments Monday, 01/13/18 at his urologist office. He says he is scheduled for 6 weeks of BCG treatment.  Vital Signs:  Doppler Pressure: 110 Automatc BP:  122/59 (90) HR: 63 SPO2: unable to pick up  Weight: 203 lb w/o eqt Last weight: 202 lb  VAD Indication: Destination Therapy- age excluding    VAD interrogation & Equipment Management: Speed:8800 Flow:  4.1 Power: 4.5w    PI: 7.7  Alarms: no clinical alarms Events: rare PI event  Fixed speed 8800 Low speed limit: 8200  Primary Controller: Replace back up battery in 6 months Back up controller: Replace back up battery in 38months  Both primary and back up controller internal batteries are down to 6 mos with advisories to change batteries: Primary controller: [id number] with [id number] back up battery; changed to [id number]; exp 08/18/19 Back up controller: [id number] with [id number]; changed to [id number]; exp 08/18/19  Exit Site Care: Drive line is being maintained weekly by [name]. Drive line exit site well healed and incorporated. The velour is fully implanted at exit site. Dressing dry and intact. No erythema or drainage. Stabilization device present and accurately applied. Pt denies fever or chills. Pt states they do not need dressing kits at this visit.  Significant Events on VAD Support:  02/2015 >>GIB (removed ASA, 2-2.5 INR) 10/2015 >>CVA(Plavix started) 02/2017> GIB (removed ASA, scopes  negative) 03/2017> percutaneous lead repair 12/2017> Bladder CA  Device:Medtronic Therapies: on Last check: today in clinic. No record of arrhythmia. Optivol elevated.  followed at device clinic, Dr Caryl Comes  BP & Labs:  MAP 110 - Doppler is reflecting MAP   Patient Instructions: 1. No change in medications 2. Return to [place] clinic in one month  Zada Girt RN [place] Coordinator   Office: [phone] 24/7 Emergency [place] Pager: [phone]

## 2018-01-12 NOTE — Progress Notes (Signed)
Patient ID: [name], male   DOB: 06/09/1941, 77 y.o.   MRN: [id number]   VAD CLINIC PROGRESS NOTE  PCP: Dr. Kandice Robinsons ([place]) GU: Dr Gloriann Loan Neuro Surgeon: Dr Vertell Limber Vascular: Dr Kellie Simmering Primary HF: Dr Haroldine Laws  [name] is a 77 yo male with h/o VT, PAD and severe systolic HF (EF 84-16%) due to mixed ischemic/nonischemic CM he is s/p HM II LVAD implant for destination therapy on October 19, 2013.VAD implantation was complicated by VT, syncope, AF/Aflutter and RHF. Required short term milrinone.    GU Event 09/01/14 had impacted ureteral stone and underwent cystoscopy and flexible ureteroscopy with lithotripsy and basket extraction.  09/2015 Hematuria and chronic nephrolithiasis. Renal US showed mild hyronephrosis and bilateral renal cysts. Dr. Risa Grill took back for repeat lithotripsy and stone extraction by ureteroscopy and J stent placement. 12/18 Recurrent hematuria. 45mm left ureteral stone. Passed with tamulosin 1/19   Admitted with recurrent hematuria with ureteral stone. Stone removed but found to have large bladder mass which was removed. Course c/b obstructive uropathy and AKI requiring 1 session of HD.   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  4/2-18 - Episode of melena. Hgb 10.9-> 8.7. EGD/colonoscopy negative 12/18 - Melena with syncope  Hgb 6.5. Transfused 5u. 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  01/2016 R CEA. He was started on Plavix.  01/2016 Lumbar fusion L3-L5 with pedicle screws posterolateral arthrodesis with BMP, allograft  VT events 9/17 with high fevers and productive cough. Treated with levaquin x 7 days for acute bronchitis. Also found to have VT and underwent DC-CV. Seen by EP and amio increased 10/17 speed turned down to 8800 due to RV failure. 10/18 with slow VT and ADHF.Cardioverted in ED and amio uptitrated.   Significant VAD Events  03/2017>percutaneous  lead repair    Follow up LVAD Clinic: He returns today for acute visit due to palpating a lump in his abdomen. Says over past few days can feel a lump in the middle of his abdomen. Denies pain. No n/v./ No redness or drainage around driveline site. No trauma. No hematuria. Denies orthopnea or PND. No fevers, chills. No bleeding, melena or neuro symptoms. No VAD alarms. Taking all meds as prescribed.   VAD Indication: Destination Therapy- age excluding    VAD interrogation & Equipment Management: Speed:8800 Flow:  4.1 Power: 4.5w PI: 7.7  Alarms: no clinical alarms Events: rare PI event  Fixed speed 8800 Low speed limit: 8200  Primary Controller: Replace back up battery in 6 months Back up controller: Replace back up battery in 61months  Both primary and back up controller internal batteries are down to 6 mos with advisories to change batteries:   Exit Site Care: Drive line is being maintained weekly by [name]. Drive line exit site well healed and incorporated. The velour is fully implanted at exit site. Dressing dry and intact. No erythema or drainage. Stabilization device present and accurately applied. Pt denies fever or chills. Pt given 6 weekly dressing kits at this visit.      Past Medical History:  Diagnosis Date  . Arthritis   . Automatic implantable cardioverter-defibrillator in situ    a. s/p Medtronic Evera device serial [id number]    . Bradycardia   . CAD (coronary artery disease)    a. s/p MI 1982;  s/p DES to CFX 2011;  s/p NSTEMI 4/12 in  setting of VTach;  cath 7/12:  LM ok, LAD mid 30-40%, Dx's with 40%, mCFX stent patent, prox to mid RCA occluded with R to R and L to R collats.  Medical Tx was continued  . Chronic systolic heart failure ([place])    a. s/p LVAD 10/2013 for destination therapy  . CKD (chronic kidney disease)   . CVD (cerebrovascular disease)    a. s/p L CEA 2008  . Cytopenia   . Dysrhythmia    AFIB  . History of GI bleed    a.  on ASA- started on plavix during 10/2015 admission for CVA  . HLD (hyperlipidemia)   . HTN (hypertension)   . Hypothyroidism   . Inappropriate therapy from implantable cardioverter-defibrillator    a. ATP for sinus tach SVT wavelet identified SVT but was no  passive (2014)  . Ischemic cardiomyopathy   . 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 L Ear   . Myocardial infarction ([place])    1992  . PAF (paroxysmal atrial fibrillation) ([place])   . PVD (peripheral vascular disease) ([place])    a. 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 Shortness of breath dyspnea    W/ EXERTION   . Sleep apnea    NO CPAP NOW  HE SENT IT BACK YRS AGO  . Stroke [name])    a. 10/2015 admission   . Ventricular tachycardia ([place])    a. s/p AICD;   h/o ICD shock;  Amiodarone Rx. S/P ablation in 2012    Current Outpatient Medications  Medication Sig Dispense Refill  . amiodarone (PACERONE) 200 MG tablet Take 200 mg by mouth daily.    [name] atorvastatin (LIPITOR) 80 MG tablet TAKE 1 TABLET DAILY AT 6 PM (Patient taking differently: TAKE 1 TABLET (80 mg) DAILY in the morning) 30 tablet 11  . docusate sodium (COLACE) 100 MG capsule Take 200 mg by mouth every morning.     [name] levothyroxine (SYNTHROID, LEVOTHROID) 150 MCG tablet Take 1 tablet (150 mcg total) by mouth daily before breakfast. 90 tablet 3  . Multiple Vitamins-Minerals (ICAPS PO) Take 1 tablet by mouth 2 (two) times daily.    . pantoprazole (PROTONIX) 40 MG tablet Take 1 tablet (40 mg total) by mouth 2 (two) times daily. 180 tablet 6  . ranolazine (RANEXA) 500 MG 12 hr tablet Take 1 tablet (500 mg total) by mouth 2 (two) times daily. 60 tablet 6  . sildenafil (REVATIO) 20 MG tablet Take 1 tablet (20 mg total) by mouth 3 (three) times daily. 90 tablet 3  . warfarin (COUMADIN) 2 MG tablet Take 2-4 mg by mouth daily. Take 2 mg (1 tablet) daily except 4 mg (2 tablets) on Tues    . zinc  gluconate 50 MG tablet Take 50 mg by mouth 2 (two) times daily.     . furosemide (LASIX) 20 MG tablet Take 20 mg by mouth daily as needed for fluid.    . nitroGLYCERIN (NITROSTAT) 0.4 MG SL tablet Place 0.4 mg under the tongue every 5 (five) minutes as needed for chest pain.     No current facility-administered medications for this encounter.     Demerol [meperidine]  REVIEW OF SYSTEMS: All systems negative except as listed in HPI, PMH and Problem list.  Vital Signs:  Doppler Pressure: 110 Automatc BP:  122/59 (90)  HR: 63 SPO2: unable to pick up  Weight: 203 lb w/o eqt Last weight: 202 lb   Physical Exam: General:  NAD.  HEENT: normal  Neck: supple. JVP 6-7.  Carotids 2+ bilat; no bruits. No lymphadenopathy or thryomegaly appreciated. Cor: LVAD hum.  Lungs: Clear. Abdomen: obese soft, nontender, non-distended. No hepatosplenomegaly. No bruits or masses. Good bowel sounds. Driveline site clean. Anchor in place. VAD driveline easily palpable under spot of "lump" Non tender. No fluctuance Extremities: no cyanosis, clubbing, rash. Warm no edema  Neuro: alert & oriented x 3. No focal deficits. Moves all 4 without problem   ASSESSMENT AND PLAN:  1. Abdominal lump - I think he is just palpating his driveline cord. On exam appears fine with no other pathology. I offered CT to further evalutae but he was ok with reassurance 2. Chronic systolic HF: s/p HM-II LVAD 10/2014.  - Recent RHC in 1/19 with mildly low output. Started on milrinone without benefit. Milrinone stopped. Now improved.  - NYHA II today. Volume status looks good. Avoid overdiuresis - Maintaining NSR - VAD interrogated personally. Parameters stable. (LVAD speed reduced to 8800 in 10/17 due to RV failure) - s/p previous driveline repair. No further pump stops 3. 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  - Will start BCG infusions  with Dr. Gloriann Loan on 3/11 4. Acute on CKD, stage III:   - recent ESRD in setting of obstructive uropathy and ATN - creatinine improved to 1.58 recently. Will recheck 5. 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 Continue coumadin and plavix.(with h/o CVA). - Admitted 4/18 with recurrent GIB. Transfused 2u. EGD/colon negative - Admitted 12./18. hgb 6.5, Transfused 5u. EGD with 2 AVMs treated with APC - No further bleeding. Recheck hgb 6. Atrial arrhythmias: Atrial fibrillation, atrial tachycardia.  S/p DC-CV 06/04/14, DC-CV 03/02/15 and 01/19/16.  - Remains in NSR.  Continue low-dose amio.  7. HTN:  - Blood pressure well controlled. Continue current regimen. 8. LVAD:  - VAD interrogated personally. Parameters stable. 9. Anticoagulation management:  - Goal INR 2-2.5 INR 2.09 Discussed dosing with PharmD personally. 10. Lumbar compression fracture:  s/p L3-L5 fusion 02/03/16. - back pain resolved 11. 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  Continue atorvastatin. (also warfarin INR goal 2-2.5). S/p R CEA 01/13/16. Plavix stopped 12/18 due to recurrent GIB 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 Forde Dandy in past.  - Amio restarted due to recurrent atrial arrhythmias and VT.  - Recent FT4 elevated. Synthroid cut to 150 daily in 12/18. Recheck next visit. May be contributing to fatigue.   13. VT  - underwent DC-CV 9/17.  - repeat episode of slow VT 08/17/17. Requiring DC-CV. Amio increased for 1 week  - continue amio 200 daily - Keep K > 4.0 Mg > 2.0 - Remains in NSR on ECG and AliveCor device. Will continue to monitor  Total time spent 35 minutes. Over half that time spent discussing above.   Frank Dettinger,MD 1:58 PM

## 2018-01-15 ENCOUNTER — Ambulatory Visit (HOSPITAL_COMMUNITY): Payer: Self-pay | Admitting: Pharmacist

## 2018-01-15 ENCOUNTER — Telehealth (HOSPITAL_COMMUNITY): Payer: Self-pay | Admitting: *Deleted

## 2018-01-15 LAB — POCT INR: INR: 2.3

## 2018-01-15 NOTE — Telephone Encounter (Signed)
Wife texted VAD pager to report patient is having increased SOB and weakness since first BCG treatment at Dr. Purvis Sheffield office Monday, 01/13/18.   Contacted Dr. Purvis Sheffield nurse and was advised primary side effects from BCG treatment are usually flu-like symptoms.  Called patient - he is c/o being very weak (using walker to get around house), is SOB walking from room to room. Woke up at 12 MN last night and had to "sleep in the chair" due to SOB. His wt is up 2 lbs since Monday's treatment, denies CP, dizziness/lightheadedness/syncope, abdominal swelling, pedal edema, palpitations, or ICD shocks. VAD parameters are wnl: speed 8800, flow 4.6, PI 6.9, and power 4.7; denies any   Dr. Haroldine Laws updated - instructed pt to take Lasix 20 mg. Instructed pt to call if symptoms worsen or do not improve. Pt verbalized understanding of same.

## 2018-01-18 LAB — CUP PACEART REMOTE DEVICE CHECK
Battery Remaining Longevity: 40 mo
Battery Voltage: 2.97 V
Brady Statistic AP VP Percent: 0.06 %
Brady Statistic AS VS Percent: 51.2 %
Brady Statistic RV Percent Paced: 0.13 %
Date Time Interrogation Session: 20190227141645
HIGH POWER IMPEDANCE MEASURED VALUE: 32 Ohm
HIGH POWER IMPEDANCE MEASURED VALUE: 42 Ohm
Implantable Lead Implant Date: 20140801
Implantable Lead Location: 753859
Implantable Lead Location: 753860
Implantable Pulse Generator Implant Date: 20140721
Lead Channel Impedance Value: 551 Ohm
Lead Channel Pacing Threshold Pulse Width: 0.4 ms
Lead Channel Sensing Intrinsic Amplitude: 2.25 mV
Lead Channel Sensing Intrinsic Amplitude: 2.25 mV
Lead Channel Setting Pacing Amplitude: 2 V
Lead Channel Setting Pacing Pulse Width: 0.4 ms
Lead Channel Setting Sensing Sensitivity: 0.3 mV
MDC IDC LEAD IMPLANT DT: 20030714
MDC IDC MSMT LEADCHNL RA IMPEDANCE VALUE: 361 Ohm
MDC IDC MSMT LEADCHNL RA PACING THRESHOLD AMPLITUDE: 1 V
MDC IDC MSMT LEADCHNL RA SENSING INTR AMPL: 0.875 mV
MDC IDC MSMT LEADCHNL RA SENSING INTR AMPL: 0.875 mV
MDC IDC MSMT LEADCHNL RV IMPEDANCE VALUE: 475 Ohm
MDC IDC MSMT LEADCHNL RV PACING THRESHOLD AMPLITUDE: 1 V
MDC IDC MSMT LEADCHNL RV PACING THRESHOLD PULSEWIDTH: 0.4 ms
MDC IDC SET LEADCHNL RV PACING AMPLITUDE: 2 V
MDC IDC STAT BRADY AP VS PERCENT: 48.69 %
MDC IDC STAT BRADY AS VP PERCENT: 0.05 %
MDC IDC STAT BRADY RA PERCENT PACED: 48.06 %

## 2018-01-20 ENCOUNTER — Ambulatory Visit (HOSPITAL_COMMUNITY)
Admission: RE | Admit: 2018-01-20 | Discharge: 2018-01-20 | Disposition: A | Discharge status: Home / Self Care | Payer: Medicare Other | Source: Ambulatory Visit | Attending: Cardiology | Admitting: Cardiology

## 2018-01-20 ENCOUNTER — Encounter (HOSPITAL_COMMUNITY): Payer: Medicare Other

## 2018-01-20 ENCOUNTER — Telehealth (HOSPITAL_COMMUNITY): Payer: Self-pay | Admitting: Unknown Physician Specialty

## 2018-01-20 ENCOUNTER — Ambulatory Visit (HOSPITAL_COMMUNITY): Payer: Self-pay | Admitting: Pharmacist

## 2018-01-20 DIAGNOSIS — Z95811 Presence of heart assist device: Secondary | ICD-10-CM

## 2018-01-20 DIAGNOSIS — Z7901 Long term (current) use of anticoagulants: Secondary | ICD-10-CM

## 2018-01-20 LAB — CBC
HEMATOCRIT: 29.1 % — AB (ref 39.0–52.0)
Hemoglobin: 8.9 g/dL — ABNORMAL LOW (ref 13.0–17.0)
MCH: 26.9 pg (ref 26.0–34.0)
MCHC: 30.6 g/dL (ref 30.0–36.0)
MCV: 87.9 fL (ref 78.0–100.0)
Platelets: 234 10*3/uL (ref 150–400)
RBC: 3.31 MIL/uL — ABNORMAL LOW (ref 4.22–5.81)
RDW: 19.5 % — AB (ref 11.5–15.5)
WBC: 6.5 10*3/uL (ref 4.0–10.5)

## 2018-01-20 LAB — BASIC METABOLIC PANEL
ANION GAP: 10 (ref 5–15)
BUN: 25 mg/dL — AB (ref 6–20)
CALCIUM: 8.8 mg/dL — AB (ref 8.9–10.3)
CO2: 21 mmol/L — AB (ref 22–32)
CREATININE: 1.34 mg/dL — AB (ref 0.61–1.24)
Chloride: 106 mmol/L (ref 101–111)
GFR calc Af Amer: 57 mL/min — ABNORMAL LOW (ref >60)
GFR calc non Af Amer: 49 mL/min — ABNORMAL LOW (ref >60)
GLUCOSE: 125 mg/dL — AB (ref 65–99)
Potassium: 4.1 mmol/L (ref 3.5–5.1)
Sodium: 137 mmol/L (ref 135–145)

## 2018-01-20 LAB — PROTIME-INR
INR: 2.67
Prothrombin Time: 28.2 seconds — ABNORMAL HIGH (ref 11.4–15.2)

## 2018-01-20 LAB — LACTATE DEHYDROGENASE: LDH: 326 U/L — ABNORMAL HIGH (ref 98–192)

## 2018-01-20 NOTE — Progress Notes (Signed)
Received page from pts wife stating that they are at the urology office and Jobin's urine sample is the color of cola-cola. Pt is unable to have the BCG treatment today per the urology nurse due to the gross blood in his urine. We will check a full set of labs here at our clinic today. Pt will need to wait on the results to r/o an increase in LDH.  Tanda Rockers RN, BSN VAD Coordinator 24/7 Pager [phone]

## 2018-01-21 ENCOUNTER — Ambulatory Visit (HOSPITAL_COMMUNITY): Payer: Self-pay | Admitting: Pharmacist

## 2018-01-21 LAB — POCT INR: INR: 2.6

## 2018-01-24 ENCOUNTER — Other Ambulatory Visit (HOSPITAL_COMMUNITY): Payer: Self-pay | Admitting: Unknown Physician Specialty

## 2018-01-24 DIAGNOSIS — Z95811 Presence of heart assist device: Secondary | ICD-10-CM

## 2018-01-24 DIAGNOSIS — Z7901 Long term (current) use of anticoagulants: Secondary | ICD-10-CM

## 2018-01-27 ENCOUNTER — Ambulatory Visit (HOSPITAL_COMMUNITY): Payer: Self-pay | Admitting: Pharmacist

## 2018-01-27 ENCOUNTER — Ambulatory Visit (HOSPITAL_COMMUNITY)
Admission: RE | Admit: 2018-01-27 | Discharge: 2018-01-27 | Disposition: A | Discharge status: Home / Self Care | Payer: Medicare Other | Source: Ambulatory Visit | Attending: Internal Medicine | Admitting: Internal Medicine

## 2018-01-27 ENCOUNTER — Other Ambulatory Visit (HOSPITAL_COMMUNITY): Payer: Self-pay | Admitting: *Deleted

## 2018-01-27 DIAGNOSIS — I472 Ventricular tachycardia, unspecified: Secondary | ICD-10-CM

## 2018-01-27 DIAGNOSIS — I5043 Acute on chronic combined systolic (congestive) and diastolic (congestive) heart failure: Secondary | ICD-10-CM

## 2018-01-27 DIAGNOSIS — I209 Angina pectoris, unspecified: Secondary | ICD-10-CM

## 2018-01-27 DIAGNOSIS — Z7901 Long term (current) use of anticoagulants: Secondary | ICD-10-CM | POA: Diagnosis not present

## 2018-01-27 DIAGNOSIS — I5022 Chronic systolic (congestive) heart failure: Secondary | ICD-10-CM | POA: Diagnosis not present

## 2018-01-27 DIAGNOSIS — Z95811 Presence of heart assist device: Secondary | ICD-10-CM | POA: Diagnosis not present

## 2018-01-27 DIAGNOSIS — I130 Hypertensive heart and chronic kidney disease with heart failure and stage 1 through stage 4 chronic kidney disease, or unspecified chronic kidney disease: Secondary | ICD-10-CM | POA: Insufficient documentation

## 2018-01-27 DIAGNOSIS — N183 Chronic kidney disease, stage 3 (moderate): Secondary | ICD-10-CM | POA: Insufficient documentation

## 2018-01-27 LAB — LACTATE DEHYDROGENASE: LDH: 350 U/L — AB (ref 98–192)

## 2018-01-27 LAB — PROTIME-INR
INR: 2.53
PROTHROMBIN TIME: 27.1 s — AB (ref 11.4–15.2)

## 2018-01-27 MED ORDER — RANOLAZINE ER 500 MG PO TB12: 500.0000 mg | ORAL_TABLET | Freq: Two times a day (BID) | ORAL | 11 refills | Status: DC

## 2018-01-29 ENCOUNTER — Encounter (HOSPITAL_COMMUNITY)
Admission: RE | Admit: 2018-01-29 | Discharge: 2018-01-29 | Disposition: A | Discharge status: Home / Self Care | Payer: Medicare Other | Source: Ambulatory Visit | Attending: Internal Medicine | Admitting: Internal Medicine

## 2018-01-29 DIAGNOSIS — Z8719 Personal history of other diseases of the digestive system: Secondary | ICD-10-CM | POA: Diagnosis present

## 2018-01-29 DIAGNOSIS — Z95811 Presence of heart assist device: Secondary | ICD-10-CM | POA: Insufficient documentation

## 2018-01-29 MED ORDER — OCTREOTIDE ACETATE 20 MG IM KIT
20.0000 mg | PACK | INTRAMUSCULAR | Status: DC
  Administered 2018-01-29: 20 mg via INTRAMUSCULAR
  Filled 2018-01-29: qty 1

## 2018-01-30 ENCOUNTER — Other Ambulatory Visit (HOSPITAL_COMMUNITY): Payer: Self-pay | Admitting: Pharmacist

## 2018-01-30 ENCOUNTER — Ambulatory Visit (HOSPITAL_COMMUNITY): Payer: Self-pay | Admitting: Pharmacist

## 2018-01-30 DIAGNOSIS — K922 Gastrointestinal hemorrhage, unspecified: Secondary | ICD-10-CM

## 2018-01-30 DIAGNOSIS — Q2730 Arteriovenous malformation, site unspecified: Secondary | ICD-10-CM

## 2018-01-30 LAB — POCT INR: INR: 2.5

## 2018-02-04 ENCOUNTER — Ambulatory Visit (HOSPITAL_COMMUNITY): Payer: Self-pay | Admitting: *Deleted

## 2018-02-04 LAB — POCT INR
INR: 3.5
INR: 3.5

## 2018-02-07 ENCOUNTER — Other Ambulatory Visit (HOSPITAL_COMMUNITY): Payer: Self-pay | Admitting: Unknown Physician Specialty

## 2018-02-07 DIAGNOSIS — Z7901 Long term (current) use of anticoagulants: Secondary | ICD-10-CM

## 2018-02-07 DIAGNOSIS — Z95811 Presence of heart assist device: Secondary | ICD-10-CM

## 2018-02-10 ENCOUNTER — Ambulatory Visit (HOSPITAL_COMMUNITY): Payer: Self-pay | Admitting: Pharmacist

## 2018-02-10 ENCOUNTER — Encounter (HOSPITAL_COMMUNITY): Payer: Self-pay | Admitting: Unknown Physician Specialty

## 2018-02-10 ENCOUNTER — Ambulatory Visit (HOSPITAL_COMMUNITY)
Admission: RE | Admit: 2018-02-10 | Discharge: 2018-02-10 | Disposition: A | Discharge status: Home / Self Care | Payer: Medicare Other | Source: Ambulatory Visit | Attending: Cardiology | Admitting: Cardiology

## 2018-02-10 ENCOUNTER — Encounter (HOSPITAL_COMMUNITY): Payer: Self-pay

## 2018-02-10 ENCOUNTER — Ambulatory Visit (HOSPITAL_COMMUNITY)
Admission: RE | Admit: 2018-02-10 | Discharge: 2018-02-10 | Disposition: A | Discharge status: Home / Self Care | Payer: Medicare Other | Source: Ambulatory Visit | Attending: Internal Medicine | Admitting: Internal Medicine

## 2018-02-10 VITALS — BP 90/0 | HR 69 | Ht 72.0 in | Wt 200.0 lb

## 2018-02-10 DIAGNOSIS — Z95811 Presence of heart assist device: Secondary | ICD-10-CM

## 2018-02-10 DIAGNOSIS — Z79899 Other long term (current) drug therapy: Secondary | ICD-10-CM | POA: Insufficient documentation

## 2018-02-10 DIAGNOSIS — N186 End stage renal disease: Secondary | ICD-10-CM | POA: Insufficient documentation

## 2018-02-10 DIAGNOSIS — R5383 Other fatigue: Secondary | ICD-10-CM | POA: Insufficient documentation

## 2018-02-10 DIAGNOSIS — F5101 Primary insomnia: Secondary | ICD-10-CM | POA: Diagnosis not present

## 2018-02-10 DIAGNOSIS — Z7901 Long term (current) use of anticoagulants: Secondary | ICD-10-CM | POA: Diagnosis not present

## 2018-02-10 DIAGNOSIS — I132 Hypertensive heart and chronic kidney disease with heart failure and with stage 5 chronic kidney disease, or end stage renal disease: Secondary | ICD-10-CM | POA: Insufficient documentation

## 2018-02-10 DIAGNOSIS — I50810 Right heart failure, unspecified: Secondary | ICD-10-CM | POA: Diagnosis present

## 2018-02-10 DIAGNOSIS — D494 Neoplasm of unspecified behavior of bladder: Secondary | ICD-10-CM | POA: Diagnosis not present

## 2018-02-10 DIAGNOSIS — I700 Atherosclerosis of aorta: Secondary | ICD-10-CM | POA: Insufficient documentation

## 2018-02-10 DIAGNOSIS — Z9221 Personal history of antineoplastic chemotherapy: Secondary | ICD-10-CM | POA: Insufficient documentation

## 2018-02-10 DIAGNOSIS — M4856XA Collapsed vertebra, not elsewhere classified, lumbar region, initial encounter for fracture: Secondary | ICD-10-CM | POA: Insufficient documentation

## 2018-02-10 DIAGNOSIS — I5022 Chronic systolic (congestive) heart failure: Secondary | ICD-10-CM | POA: Diagnosis not present

## 2018-02-10 DIAGNOSIS — Z8673 Personal history of transient ischemic attack (TIA), and cerebral infarction without residual deficits: Secondary | ICD-10-CM | POA: Insufficient documentation

## 2018-02-10 DIAGNOSIS — G4700 Insomnia, unspecified: Secondary | ICD-10-CM | POA: Insufficient documentation

## 2018-02-10 DIAGNOSIS — Z8551 Personal history of malignant neoplasm of bladder: Secondary | ICD-10-CM | POA: Diagnosis not present

## 2018-02-10 DIAGNOSIS — J449 Chronic obstructive pulmonary disease, unspecified: Secondary | ICD-10-CM | POA: Insufficient documentation

## 2018-02-10 DIAGNOSIS — I472 Ventricular tachycardia: Secondary | ICD-10-CM | POA: Insufficient documentation

## 2018-02-10 LAB — CBC
HCT: 29.2 % — ABNORMAL LOW (ref 39.0–52.0)
Hemoglobin: 8.6 g/dL — ABNORMAL LOW (ref 13.0–17.0)
MCH: 25.1 pg — ABNORMAL LOW (ref 26.0–34.0)
MCHC: 29.5 g/dL — ABNORMAL LOW (ref 30.0–36.0)
MCV: 85.1 fL (ref 78.0–100.0)
PLATELETS: 235 10*3/uL (ref 150–400)
RBC: 3.43 MIL/uL — ABNORMAL LOW (ref 4.22–5.81)
RDW: 19.6 % — AB (ref 11.5–15.5)
WBC: 5.5 10*3/uL (ref 4.0–10.5)

## 2018-02-10 LAB — BASIC METABOLIC PANEL
Anion gap: 11 (ref 5–15)
BUN: 26 mg/dL — AB (ref 6–20)
CO2: 20 mmol/L — ABNORMAL LOW (ref 22–32)
Calcium: 8.8 mg/dL — ABNORMAL LOW (ref 8.9–10.3)
Chloride: 103 mmol/L (ref 101–111)
Creatinine, Ser: 1.29 mg/dL — ABNORMAL HIGH (ref 0.61–1.24)
GFR calc Af Amer: 60 mL/min — ABNORMAL LOW (ref >60)
GFR, EST NON AFRICAN AMERICAN: 51 mL/min — AB (ref >60)
GLUCOSE: 98 mg/dL (ref 65–99)
Potassium: 3.8 mmol/L (ref 3.5–5.1)
Sodium: 134 mmol/L — ABNORMAL LOW (ref 135–145)

## 2018-02-10 LAB — PROTIME-INR
INR: 3.39
PROTHROMBIN TIME: 34 s — AB (ref 11.4–15.2)

## 2018-02-10 LAB — LACTATE DEHYDROGENASE: LDH: 335 U/L — AB (ref 98–192)

## 2018-02-10 NOTE — Progress Notes (Addendum)
Patient presents for sick visit in [facility] today with caregiver, [name].  Reports no problems with VAD equipment or concerns with drive line exit site.     Pt also reports he completed 2 chemo treatments but they were stopped by Dr. Gloriann Loan due to pts fatigue, SOB and weakness.   Vital Signs:  Doppler Pressure: 90 Automatc BP:  114/90 (99) HR: 63 SPO2: 94  Weight: 200 lb w/o eqt Last weight: 203 lb  VAD Indication: Destination Therapy- age excluding    VAD interrogation & Equipment Management: Speed:8800 Flow:  4.0 Power: 4.5w    PI: 8.0  Alarms: no clinical alarms Events: rare PI event  Fixed speed 8800 Low speed limit: 8200  Primary Controller: Replace back up battery in 31 months Back up controller: Replace back up battery in 65months  Exit Site Care: Drive line is being maintained weekly by [name]. Drive line exit site well healed and incorporated. The velour is fully implanted at exit site. Dressing dry and intact. No erythema or drainage. Stabilization device present and accurately applied. Pt denies fever or chills. Pt states they do not need dressing kits at this visit.  Significant Events on VAD Support:  02/2015 >>GIB (removed ASA, 2-2.5 INR) 10/2015 >>CVA(Plavix started) 02/2017> GIB (removed ASA, scopes negative) 03/2017> percutaneous lead repair 12/2017> Bladder CA  Device:Medtronic Therapies: on Last check: today in clinic. No record of arrhythmia. Optivol elevated.  followed at device clinic, Dr Caryl Comes  BP & Labs:  MAP 90 - Doppler is reflecting MAP  Hgb 8.6- No S/S of bleeding. Specifically denies melena/BRBPR or nosebleeds. No blood in urine.  LDH stable at 335with established baseline of 280- 380. Denies tea-colored urine. No power elevations noted on interrogation.   Pt had cxray today: COPD with superimposed low-grade CHF. Slight interval improvement since the previous study. The cardiac size of port devices are in stable  position.  Thoracic aortic atherosclerosis.   Patient Instructions: 1. Take 20 mg Lasix today. 2. Arrange for [facility] home rehab asap. 3. Start Temazepam 30 mg if OTC do not help. 4. Chest xray today pending. 5. Return to [place] [facility] in 2 weeks.  Tanda Rockers RN [place] Coordinator   Office: [phone] 24/7 Emergency VAD Pager: [phone]

## 2018-02-10 NOTE — Progress Notes (Signed)
Referral sent to [facility] for at home PT/OT. [place], demographic and insurance info faxed to [phone].  Tanda Rockers RN, BSN VAD Coordinator 24/7 Pager [phone]

## 2018-02-12 ENCOUNTER — Other Ambulatory Visit (HOSPITAL_COMMUNITY): Payer: Self-pay | Admitting: *Deleted

## 2018-02-12 DIAGNOSIS — R413 Other amnesia: Secondary | ICD-10-CM

## 2018-02-12 MED ORDER — TEMAZEPAM 30 MG PO CAPS: 30.0000 mg | ORAL_CAPSULE | Freq: Every evening | ORAL | 3 refills | Status: DC | PRN

## 2018-02-13 ENCOUNTER — Ambulatory Visit (HOSPITAL_COMMUNITY): Payer: Self-pay | Admitting: Pharmacist

## 2018-02-13 LAB — POCT INR: INR: 3.3

## 2018-02-15 NOTE — Progress Notes (Signed)
Patient ID: [name], male   DOB: [birth year], 77 y.o.   MRN: [id number]   VAD CLINIC PROGRESS NOTE  PCP: Dr. Kandice Robinsons ([place]) GU: Dr Gloriann Loan Neuro Surgeon: Dr Vertell Limber Vascular: Dr Kellie Simmering Primary HF: Dr Haroldine Laws  [name] is a 77 yo male with h/o VT, PAD and severe systolic HF (EF 22-02%) due to mixed ischemic/nonischemic CM he is s/p HM II LVAD implant for destination therapy on October 19, 2013.VAD implantation was complicated by VT, syncope, AF/Aflutter and RHF. Required short term milrinone.    GU Event 09/01/14 had impacted ureteral stone and underwent cystoscopy and flexible ureteroscopy with lithotripsy and basket extraction.  09/2015 Hematuria and chronic nephrolithiasis. Renal US showed mild hyronephrosis and bilateral renal cysts. Dr. Risa Grill took back for repeat lithotripsy and stone extraction by ureteroscopy and J stent placement. 12/18 Recurrent hematuria. 47mm left ureteral stone. Passed with tamulosin 1/19   Admitted with recurrent hematuria with ureteral stone. Stone removed but found to have large bladder mass which was removed. Course c/b obstructive uropathy and AKI requiring 1 session of HD.  3/19 Bladder chemo stopped after 2 treatments as unable to tolerate  GI Event  02/2015 - Hgb 5.4 --> EGD that showed 2 AVMs in the jejunum that were clipped.  4/2-18 - Episode of melena. Hgb 10.9-> 8.7. EGD/colonoscopy negative 12/18 - Melena with syncope  Hgb 6.5. Transfused 5u. 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  01/2016 R CEA. He was started on Plavix.  01/2016 Lumbar fusion L3-L5 with pedicle screws posterolateral arthrodesis with BMP, allograft  VT events 9/17 with high fevers and productive cough. Treated with levaquin x 7 days for acute bronchitis. Also found to have VT and underwent DC-CV. Seen by EP and amio increased 10/17 speed turned down to 8800 due to RV failure. 10/18 with slow VT and ADHF.Cardioverted in ED and  amio uptitrated.   Significant VAD Events  03/2017>percutaneous lead repair   Follow up LVAD Clinic: He presents today with [name] for a sick visit. Bladder chemo infusions (BCG) had to be stopped after 2 treatments due to severe fatigue and inability to tolerate. He continues to remain very weak and fatigued. SOB with mild exertion. He has had very mild edema. Not taking lasix. Sleeping poorly. No snoring. Denies orthopnea or PND. No fevers, chills or problems with driveline. No bleeding, melena or neuro symptoms. No VAD alarms. Taking all meds as prescribed.   VAD Indication: Destination Therapy- age excluding    VAD interrogation & Equipment Management: Speed:8800 Flow:  4.0 Power: 4.5w PI: 8.0  Alarms: no clinical alarms Events: rare PI event  Fixed speed 8800 Low speed limit: 8200  Primary Controller: Replace back up battery in 31 months Back up controller: Replace back up battery in 63months    Exit Site Care: Drive line is being maintained weekly by [name]. Drive line exit site well healed and incorporated. The velour is fully implanted at exit site. Dressing dry and intact. No erythema or drainage. Stabilization device present and accurately applied. Pt denies fever or chills. Pt given 6 weekly dressing kits at this visit.      Past Medical History:  Diagnosis Date  . Arthritis   . Automatic implantable cardioverter-defibrillator in situ    a. s/p Medtronic Evera device serial [id number]    . Bradycardia   . CAD (coronary artery disease)    a. s/p MI 1982;  s/p DES to CFX 2011;  s/p NSTEMI 4/12 in setting of VTach;  cath 7/12:  LM ok, LAD mid 30-40%, Dx's with 40%, mCFX stent patent, prox to mid RCA occluded with R to R and L to R collats.  Medical Tx was continued  . Chronic systolic heart failure ([place])    a. s/p LVAD 10/2013 for destination therapy  . CKD (chronic kidney disease)   . CVD (cerebrovascular disease)    a. s/p L CEA 2008  . Cytopenia    . Dysrhythmia    AFIB  . History of GI bleed    a. on ASA- started on plavix during 10/2015 admission for CVA  . HLD (hyperlipidemia)   . HTN (hypertension)   . Hypothyroidism   . Inappropriate therapy from implantable cardioverter-defibrillator    a. ATP for sinus tach SVT wavelet identified SVT but was no  passive (2014)  . Ischemic cardiomyopathy   . 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 L Ear   . Myocardial infarction ([place])    1992  . PAF (paroxysmal atrial fibrillation) ([place])   . PVD (peripheral vascular disease) ([place])    a. 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 Shortness of breath dyspnea    W/ EXERTION   . Sleep apnea    NO CPAP NOW  HE SENT IT BACK YRS AGO  . Stroke [facility])    a. 10/2015 admission   . Ventricular tachycardia ([place])    a. s/p AICD;   h/o ICD shock;  Amiodarone Rx. S/P ablation in 2012    Current Outpatient Medications  Medication Sig Dispense Refill  . amiodarone (PACERONE) 200 MG tablet Take 200 mg by mouth daily.    [name] atorvastatin (LIPITOR) 80 MG tablet TAKE 1 TABLET DAILY AT 6 PM (Patient taking differently: TAKE 1 TABLET (80 mg) DAILY in the morning) 30 tablet 11  . docusate sodium (COLACE) 100 MG capsule Take 200 mg by mouth every morning.     . fexofenadine (ALLEGRA) 60 MG tablet Take 60 mg by mouth as needed for allergies or rhinitis.    . furosemide (LASIX) 20 MG tablet Take 20 mg by mouth daily as needed for fluid.    [name] levothyroxine (SYNTHROID, LEVOTHROID) 150 MCG tablet Take 1 tablet (150 mcg total) by mouth daily before breakfast. 90 tablet 3  . Multiple Vitamins-Minerals (ICAPS PO) Take 1 tablet by mouth 2 (two) times daily.    . pantoprazole (PROTONIX) 40 MG tablet Take 1 tablet (40 mg total) by mouth 2 (two) times daily. 180 tablet 6  . ranolazine (RANEXA) 500 MG 12 hr tablet Take 1 tablet (500 mg total) by mouth 2 (two) times daily. 60 tablet 11  .  sildenafil (REVATIO) 20 MG tablet Take 1 tablet (20 mg total) by mouth 3 (three) times daily. 90 tablet 3  . warfarin (COUMADIN) 2 MG tablet Take 2 mg by mouth daily.     [name] zinc gluconate 50 MG tablet Take 50 mg by mouth 2 (two) times daily.     . temazepam (RESTORIL) 30 MG capsule Take 1 capsule (30 mg total) by mouth at bedtime as needed for sleep. 30 capsule 3   No current facility-administered medications for this encounter.     Demerol [meperidine]  REVIEW OF SYSTEMS: All systems negative except as listed in HPI, PMH and Problem list.  Vital Signs:  Doppler Pressure: 90 Automatc BP:  114/90 (99) HR: 63 SPO2: 94  Weight: 200 lb w/o eqt Last weight: 203 lb  Physical exam: General:  Fatigued appearing. NAD HEENT: normal  Neck: supple. JVP 7-8  Carotids 2+ bilat; no bruits. No lymphadenopathy or thryomegaly appreciated. Cor: LVAD hum.  Lungs: Clear. NO WHEEZE  Abdomen: obese soft, nontender, non-distended. No hepatosplenomegaly. No bruits or masses. Good bowel sounds. Driveline site clean. Anchor in place.  Extremities: no cyanosis, clubbing, rash. Warm 1+ edema  Neuro: alert & oriented x 3. No focal deficits. Moves all 4 without problem    ASSESSMENT AND PLAN:  1. Fatigue/malaise - Likely multifactorial. Prior to bladder cancer had RHC and CO was a bit low but did not improve any with milrinone. Has also had a difficult course with recent hospitalization and cancer diagnosis. Also sleeping poorly and deconditioned - will arrange for HHPT 2. Chronic systolic HF: s/p HM-II LVAD 10/2014.  - May have component of RV failure. Recent RHC in 1/19 with mildly low output. Started on milrinone without benefit.  - NYHA III-IIIB . Volume status mildly elevated. Will ave him take a lsix today and as needed - CXR obtained in clinic without overt CHF (Personally reviewed) - VAD interrogated personally. Parameters stable. (LVAD speed reduced to 8800 in 10/17 due to RV failure) - s/p  previous driveline repair. No further pump stops 3. 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  - Has had 2 BCG infusions with Dr. Gloriann Loan but discontinued due to inability to tolerate 4. Acute on CKD, stage III:   - recent ESRD in setting of obstructive uropathy and ATN - creatinine improved to 1.29 today  5. 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 Continue coumadin and plavix.(with h/o CVA). - Admitted 4/18 with recurrent GIB. Transfused 2u. EGD/colon negative - Admitted 12./18. hgb 6.5, Transfused 5u. EGD with 2 AVMs treated with APC - No further bleeding.  - Hgb 8.6 today 6. Atrial arrhythmias: Atrial fibrillation, atrial tachycardia.  S/p DC-CV 06/04/14, DC-CV 03/02/15 and 01/19/16.  - Remains in NSR.  Continue low-dose amio.  7. HTN:  - Blood pressure well controlled. Continue current regimen. 8. LVAD:  - VAD interrogated personally. Parameters stable. 9. Anticoagulation management:  - Goal INR 2-2.5 INR 3.39 Discussed dosing with PharmD personally. 10. Lumbar compression fracture:  s/p L3-L5 fusion 02/03/16. - back pain resolved 11. 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  Continue atorvastatin. (also warfarin INR goal 2-2.5). S/p R CEA 01/13/16. Plavix stopped 12/18 due to recurrent GIB 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 Forde Dandy in past.  - Amio restarted due to recurrent atrial arrhythmias and VT.  - Recent FT4 elevated. Synthroid cut to 150 daily in 12/18. Recheck next visit. May be contributing to fatigue.   13. VT  - underwent DC-CV 9/17.  - repeat episode of slow VT 08/17/17. Requiring DC-CV. Amio increased for 1 week  - continue amio 200 daily - Keep K > 4.0 Mg > 2.0 - Remains in NSR tray 14. Insomnia - can use temazepam 30 qhs PRN  He is declinign overall with multiple issues. Will proceed with HHPT and medication adjustments as above. Will also  need to check thyroid panel. If no improvement may be headed toward more palliative route.   Total time spent 35 minutes. Over half that time spent discussing above.  Daniel Bensimhon,MD 2:42 PM

## 2018-02-17 ENCOUNTER — Ambulatory Visit (HOSPITAL_COMMUNITY): Payer: Self-pay | Admitting: Pharmacist

## 2018-02-17 LAB — POCT INR: INR: 2.2

## 2018-02-19 ENCOUNTER — Encounter: Payer: Self-pay | Admitting: Internal Medicine

## 2018-02-24 ENCOUNTER — Encounter (HOSPITAL_COMMUNITY): Payer: Medicare Other

## 2018-02-25 ENCOUNTER — Other Ambulatory Visit (HOSPITAL_COMMUNITY): Payer: Self-pay | Admitting: Unknown Physician Specialty

## 2018-02-25 ENCOUNTER — Other Ambulatory Visit (HOSPITAL_COMMUNITY): Payer: Self-pay | Admitting: *Deleted

## 2018-02-25 DIAGNOSIS — Z95811 Presence of heart assist device: Secondary | ICD-10-CM

## 2018-02-25 DIAGNOSIS — Z7901 Long term (current) use of anticoagulants: Secondary | ICD-10-CM

## 2018-02-26 ENCOUNTER — Ambulatory Visit (HOSPITAL_COMMUNITY)
Admission: RE | Admit: 2018-02-26 | Discharge: 2018-02-26 | Disposition: A | Discharge status: Home / Self Care | Payer: Medicare Other | Source: Ambulatory Visit | Attending: Internal Medicine | Admitting: Internal Medicine

## 2018-02-26 ENCOUNTER — Encounter (HOSPITAL_COMMUNITY): Payer: Self-pay

## 2018-02-26 ENCOUNTER — Encounter (HOSPITAL_COMMUNITY)
Admission: RE | Admit: 2018-02-26 | Discharge: 2018-02-26 | Disposition: A | Discharge status: Home / Self Care | Payer: Medicare Other | Source: Ambulatory Visit | Attending: Internal Medicine | Admitting: Internal Medicine

## 2018-02-26 ENCOUNTER — Ambulatory Visit (HOSPITAL_COMMUNITY): Payer: Self-pay | Admitting: Pharmacist

## 2018-02-26 VITALS — BP 78/0 | HR 59 | Ht 72.0 in | Wt 190.4 lb

## 2018-02-26 DIAGNOSIS — Z7901 Long term (current) use of anticoagulants: Secondary | ICD-10-CM | POA: Diagnosis not present

## 2018-02-26 DIAGNOSIS — I480 Paroxysmal atrial fibrillation: Secondary | ICD-10-CM

## 2018-02-26 DIAGNOSIS — Z8551 Personal history of malignant neoplasm of bladder: Secondary | ICD-10-CM | POA: Diagnosis not present

## 2018-02-26 DIAGNOSIS — Z9221 Personal history of antineoplastic chemotherapy: Secondary | ICD-10-CM | POA: Diagnosis not present

## 2018-02-26 DIAGNOSIS — M4856XA Collapsed vertebra, not elsewhere classified, lumbar region, initial encounter for fracture: Secondary | ICD-10-CM | POA: Insufficient documentation

## 2018-02-26 DIAGNOSIS — Q2730 Arteriovenous malformation, site unspecified: Secondary | ICD-10-CM

## 2018-02-26 DIAGNOSIS — I132 Hypertensive heart and chronic kidney disease with heart failure and with stage 5 chronic kidney disease, or end stage renal disease: Secondary | ICD-10-CM | POA: Insufficient documentation

## 2018-02-26 DIAGNOSIS — I472 Ventricular tachycardia: Secondary | ICD-10-CM | POA: Insufficient documentation

## 2018-02-26 DIAGNOSIS — Z95811 Presence of heart assist device: Secondary | ICD-10-CM | POA: Insufficient documentation

## 2018-02-26 DIAGNOSIS — Z8719 Personal history of other diseases of the digestive system: Secondary | ICD-10-CM | POA: Insufficient documentation

## 2018-02-26 DIAGNOSIS — Z79899 Other long term (current) drug therapy: Secondary | ICD-10-CM | POA: Insufficient documentation

## 2018-02-26 DIAGNOSIS — R5383 Other fatigue: Secondary | ICD-10-CM | POA: Diagnosis not present

## 2018-02-26 DIAGNOSIS — I739 Peripheral vascular disease, unspecified: Secondary | ICD-10-CM | POA: Insufficient documentation

## 2018-02-26 DIAGNOSIS — R5381 Other malaise: Secondary | ICD-10-CM

## 2018-02-26 DIAGNOSIS — Z8673 Personal history of transient ischemic attack (TIA), and cerebral infarction without residual deficits: Secondary | ICD-10-CM | POA: Insufficient documentation

## 2018-02-26 DIAGNOSIS — K922 Gastrointestinal hemorrhage, unspecified: Secondary | ICD-10-CM

## 2018-02-26 DIAGNOSIS — N183 Chronic kidney disease, stage 3 (moderate): Secondary | ICD-10-CM | POA: Diagnosis not present

## 2018-02-26 DIAGNOSIS — I5022 Chronic systolic (congestive) heart failure: Secondary | ICD-10-CM | POA: Diagnosis not present

## 2018-02-26 DIAGNOSIS — G4700 Insomnia, unspecified: Secondary | ICD-10-CM | POA: Diagnosis not present

## 2018-02-26 LAB — PROTIME-INR
INR: 2.22
PROTHROMBIN TIME: 24.4 s — AB (ref 11.4–15.2)

## 2018-02-26 LAB — CBC
HCT: 29.2 % — ABNORMAL LOW (ref 39.0–52.0)
Hemoglobin: 8.7 g/dL — ABNORMAL LOW (ref 13.0–17.0)
MCH: 24.9 pg — ABNORMAL LOW (ref 26.0–34.0)
MCHC: 29.8 g/dL — AB (ref 30.0–36.0)
MCV: 83.4 fL (ref 78.0–100.0)
Platelets: 226 10*3/uL (ref 150–400)
RBC: 3.5 MIL/uL — ABNORMAL LOW (ref 4.22–5.81)
RDW: 19.6 % — AB (ref 11.5–15.5)
WBC: 6.3 10*3/uL (ref 4.0–10.5)

## 2018-02-26 LAB — BASIC METABOLIC PANEL
ANION GAP: 8 (ref 5–15)
BUN: 32 mg/dL — ABNORMAL HIGH (ref 6–20)
CALCIUM: 8.9 mg/dL (ref 8.9–10.3)
CO2: 23 mmol/L (ref 22–32)
CREATININE: 1.3 mg/dL — AB (ref 0.61–1.24)
Chloride: 103 mmol/L (ref 101–111)
GFR, EST AFRICAN AMERICAN: 59 mL/min — AB (ref >60)
GFR, EST NON AFRICAN AMERICAN: 51 mL/min — AB (ref >60)
Glucose, Bld: 107 mg/dL — ABNORMAL HIGH (ref 65–99)
Potassium: 4 mmol/L (ref 3.5–5.1)
SODIUM: 134 mmol/L — AB (ref 135–145)

## 2018-02-26 LAB — LACTATE DEHYDROGENASE: LDH: 313 U/L — ABNORMAL HIGH (ref 98–192)

## 2018-02-26 MED ORDER — OCTREOTIDE ACETATE 20 MG IM KIT
20.0000 mg | PACK | INTRAMUSCULAR | Status: DC
  Administered 2018-02-26: 20 mg via INTRAMUSCULAR
  Filled 2018-02-26: qty 1

## 2018-02-26 NOTE — Progress Notes (Signed)
Patient ID: [name], male   DOB: 1941/04/29, 77 y.o.   MRN: [id number]   VAD CLINIC PROGRESS NOTE  PCP: Dr. Kandice Robinsons ([place]) GU: Dr Gloriann Loan Neuro Surgeon: Dr Vertell Limber Vascular: Dr Kellie Simmering Primary HF: Dr Haroldine Laws  [name] is a 77 yo male with h/o VT, PAD and severe systolic HF (EF 25-63%) due to mixed ischemic/nonischemic CM he is s/p HM II LVAD implant for destination therapy on October 19, 2013.VAD implantation was complicated by VT, syncope, AF/Aflutter and RHF. Required short term milrinone.    GU Event 09/01/14 had impacted ureteral stone and underwent cystoscopy and flexible ureteroscopy with lithotripsy and basket extraction.  09/2015 Hematuria and chronic nephrolithiasis. Renal US showed mild hyronephrosis and bilateral renal cysts. Dr. Risa Grill took back for repeat lithotripsy and stone extraction by ureteroscopy and J stent placement. 12/18 Recurrent hematuria. 72mm left ureteral stone. Passed with tamulosin 1/19   Admitted with recurrent hematuria with ureteral stone. Stone removed but found to have large bladder mass which was removed. Course c/b obstructive uropathy and AKI requiring 1 session of HD.  3/19 Bladder chemo stopped after 2 treatments as unable to tolerate  GI Event  02/2015 - Hgb 5.4 --> EGD that showed 2 AVMs in the jejunum that were clipped.  4/2-18 - Episode of melena. Hgb 10.9-> 8.7. EGD/colonoscopy negative 12/18 - Melena with syncope  Hgb 6.5. Transfused 5u. 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  01/2016 R CEA. He was started on Plavix.  01/2016 Lumbar fusion L3-L5 with pedicle screws posterolateral arthrodesis with BMP, allograft  VT events 9/17 with high fevers and productive cough. Treated with levaquin x 7 days for acute bronchitis. Also found to have VT and underwent DC-CV. Seen by EP and amio increased 10/17 speed turned down to 8800 due to RV failure. 10/18 with slow VT and ADHF.Cardioverted in ED and  amio uptitrated.   Significant VAD Events  03/2017>percutaneous lead repair   Follow up LVAD Clinic: He returns today with [name] for f/u. At last visit feeling very wiped out. Had to stop bladder BCG treatments due to severe fatigue. He was also volume overloaded and was instructed to take lasix more frequently as needed.  Today he states he feels a bit better. Able to do ADLs with less trouble. Taking lasix about 1x/week for edema. Denies orthopnea or PND. No fevers, chills or problems with driveline. No bleeding, melena or neuro symptoms. No VAD alarms. Taking all meds as prescribed.    VAD Indication: Destination Therapy- age excluding    VAD interrogation & Equipment Management: Speed:8800 Flow:  4.0 Power: 4.5w PI: 8.5  Alarms: no clinical alarms Events: rare PI event  Fixed speed 8800 Low speed limit: 8200  Primary Controller: Replace back up battery in 31 months Back up controller: Replace back up battery in 62months Both primary and back up controller internal batteries are down to 6 mos with advisories to change batteries:   Exit Site Care: Drive line is being maintained weekly by [name]. Drive line exit site well healed and incorporated. The velour is fully implanted at exit site. Dressing dry and intact. No erythema or drainage. Stabilization device present and accurately applied. Pt denies fever or chills. Pt given 6 weekly dressing kits at this visit.     Past Medical History:  Diagnosis Date  . Arthritis   . Automatic implantable cardioverter-defibrillator in situ    a. s/p Medtronic Evera device serial [id number]    .  Bradycardia   . CAD (coronary artery disease)    a. s/p MI 1982;  s/p DES to CFX 2011;  s/p NSTEMI 4/12 in setting of VTach;  cath 7/12:  LM ok, LAD mid 30-40%, Dx's with 40%, mCFX stent patent, prox to mid RCA occluded with R to R and L to R collats.  Medical Tx was continued  . Chronic systolic heart failure ([place])    a. s/p LVAD  10/2013 for destination therapy  . CKD (chronic kidney disease)   . CVD (cerebrovascular disease)    a. s/p L CEA 2008  . Cytopenia   . Dysrhythmia    AFIB  . History of GI bleed    a. on ASA- started on plavix during 10/2015 admission for CVA  . HLD (hyperlipidemia)   . HTN (hypertension)   . Hypothyroidism   . Inappropriate therapy from implantable cardioverter-defibrillator    a. ATP for sinus tach SVT wavelet identified SVT but was no  passive (2014)  . Ischemic cardiomyopathy   . 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 L Ear   . Myocardial infarction ([place])    1992  . PAF (paroxysmal atrial fibrillation) ([place])   . PVD (peripheral vascular disease) ([place])    a. 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 Shortness of breath dyspnea    W/ EXERTION   . Sleep apnea    NO CPAP NOW  HE SENT IT BACK YRS AGO  . Stroke [facility])    a. 10/2015 admission   . Ventricular tachycardia ([place])    a. s/p AICD;   h/o ICD shock;  Amiodarone Rx. S/P ablation in 2012    Current Outpatient Medications  Medication Sig Dispense Refill  . amiodarone (PACERONE) 200 MG tablet Take 200 mg by mouth daily.    [name] atorvastatin (LIPITOR) 80 MG tablet TAKE 1 TABLET DAILY AT 6 PM (Patient taking differently: TAKE 1 TABLET (80 mg) DAILY in the morning) 30 tablet 11  . docusate sodium (COLACE) 100 MG capsule Take 200 mg by mouth every morning.     . fexofenadine (ALLEGRA) 60 MG tablet Take 60 mg by mouth as needed for allergies or rhinitis.    . furosemide (LASIX) 20 MG tablet Take 20 mg by mouth daily as needed for fluid.    [name] levothyroxine (SYNTHROID, LEVOTHROID) 150 MCG tablet Take 1 tablet (150 mcg total) by mouth daily before breakfast. 90 tablet 3  . Multiple Vitamins-Minerals (ICAPS PO) Take 1 tablet by mouth 2 (two) times daily.    . pantoprazole (PROTONIX) 40 MG tablet Take 1 tablet (40 mg total) by mouth 2 (two) times daily. 180  tablet 6  . ranolazine (RANEXA) 500 MG 12 hr tablet Take 1 tablet (500 mg total) by mouth 2 (two) times daily. 60 tablet 11  . sildenafil (REVATIO) 20 MG tablet Take 1 tablet (20 mg total) by mouth 3 (three) times daily. 90 tablet 3  . temazepam (RESTORIL) 30 MG capsule Take 1 capsule (30 mg total) by mouth at bedtime as needed for sleep. 30 capsule 3  . warfarin (COUMADIN) 2 MG tablet Take 2 mg (1 tablet) daily except 1 mg (1/2 tablet) on Monday    . zinc gluconate 50 MG tablet Take 50 mg by mouth 2 (two) times daily.      No current  facility-administered medications for this encounter.    Facility-Administered Medications Ordered in Other Encounters  Medication Dose Route Frequency Provider Last Rate Last Dose  . octreotide (SANDOSTATIN LAR) IM injection 20 mg  20 mg Intramuscular Q28 days Bensimhon, Shaune Pascal, MD   20 mg at 02/26/18 1355    Demerol [meperidine]  REVIEW OF SYSTEMS: All systems negative except as listed in HPI, PMH and Problem list.  Vital Signs:  Doppler Pressure: 78 Automatc BP:  106/60 (78) HR: 59 SPO2: 95  Weight: 190.6 lb w/o eqt Last weight: 200 lb    Physical exam: General:  Fatigued and pale but in NAD   HEENT: normal  Neck: supple. JVP 6.  Carotids 2+ bilat; no bruits. No lymphadenopathy or thryomegaly appreciated. Cor: LVAD hum.  Lungs: Clear. Abdomen:soft, nontender, non-distended. No hepatosplenomegaly. No bruits or masses. Good bowel sounds. Driveline site clean. Anchor in place.  Extremities: no cyanosis, clubbing, rash. Warm no edema  Neuro: alert & oriented x 3. No focal deficits. Moves all 4 without problem     ASSESSMENT AND PLAN:  1. Fatigue/malaise - Likely multifactorial. Seems somewhat improved today. We called to arrange cardiac rehab at [facility] for him  - Prior to bladder cancer had RHC and CO was a bit low but did not improve any with milrinone so will not reattempt inotorpes - Hopefull he will improve with supportive  care. Hgb stable at 8.7. Could consider 1u RBCs to see if this would help  2. Chronic systolic HF: s/p HM-II LVAD 10/2014.  - May have component of RV failure. Recent RHC in 1/19 with mildly low output. Started on milrinone without benefit.  - Mildly improved NYHA III . Volume status looks good today. Continue lasix prn but take at least 1x/week  - VAD interrogated personally. Parameters stable. (LVAD speed reduced to 8800 in 10/17 due to RV failure) - s/p previous driveline repair. No further pump stops 3. 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  - Has had 2 BCG infusions with Dr. Gloriann Loan but discontinued due to inability to tolerate 4. Acute on CKD, stage III:   - recent ESRD in setting of obstructive uropathy and ATN - creatinine stable at 1.30 today  5. 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 Continue coumadin and plavix.(with h/o CVA). - Admitted 4/18 with recurrent GIB. Transfused 2u. EGD/colon negative - Admitted 12./18. hgb 6.5, Transfused 5u. EGD with 2 AVMs treated with APC - No further bleeding.  - Hgb 8.7 today 6. Atrial arrhythmias: Atrial fibrillation, atrial tachycardia.  S/p DC-CV 06/04/14, DC-CV 03/02/15 and 01/19/16.  - Remains in NSR.  Continue low-dose amio.  7. HTN:  - Blood pressure well controlled. Continue current regimen. 8. LVAD:  - VAD interrogated personally. Parameters stable. - LDH 313 9. Anticoagulation management:  - Goal INR 2-2.5 INR 2.22 Discussed dosing with PharmD personally. 10. Lumbar compression fracture:  s/p L3-L5 fusion 02/03/16. - back pain resolved 11. 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  Continue atorvastatin. (also warfarin INR goal 2-2.5). S/p R CEA 01/13/16. Plavix stopped 12/18 due to recurrent GIB 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 Forde Dandy in past.  - Amio restarted due to recurrent atrial arrhythmias and  VT.  - Recent FT4 elevated. Synthroid cut to 150 daily in 12/18. Recheck next visit. May be contributing to fatigue.   13. VT  - underwent  DC-CV 9/17.  - repeat episode of slow VT 08/17/17. Requiring DC-CV. Amio increased for 1 week  - continue amio 200 daily - Keep K > 4.0 Mg > 2.0 - Remains in NSR today 14. Insomnia - can use temazepam 30 qhs PRN  .Total time spent 35 minutes. Over half that time spent discussing above.   Benay Spice 7:39 PM

## 2018-02-26 NOTE — Progress Notes (Signed)
Patient presents for 2 week follow up in [facility] today with caregiver, [name].  Reports no problems with VAD equipment or concerns with drive line exit site.     Pt is still c/o fatigue, SOB and weakness.  Vital Signs:  Doppler Pressure: 78 Automatc BP:  106/60 (78) HR: 59 SPO2: 95  Weight: 190.6 lb w/o eqt Last weight: 200 lb  VAD Indication: Destination Therapy- age excluding    VAD interrogation & Equipment Management: Speed:8800 Flow:  4.0 Power: 4.5w    PI: 8.5  Alarms: no clinical alarms Events: rare PI event  Fixed speed 8800 Low speed limit: 8200  Primary Controller: Replace back up battery in 31 months Back up controller: Replace back up battery in 64months  Exit Site Care: Drive line is being maintained weekly by [name]. Drive line exit site well healed and incorporated. The velour is fully implanted at exit site. Dressing dry and intact. No erythema or drainage. Stabilization device present and accurately applied. Pt denies fever or chills. Pt given 4 dressing kits today.  Significant Events on VAD Support:  02/2015 >>GIB (removed ASA, 2-2.5 INR) 10/2015 >>CVA(Plavix started) 02/2017> GIB (removed ASA, scopes negative) 03/2017> percutaneous lead repair 12/2017> Bladder CA  Device:Medtronic Therapies: on Last check: today in clinic. No record of arrhythmia. Optivol elevated.  followed at device clinic, Dr Caryl Comes  BP & Labs:  MAP 78 - Doppler is reflecting MAP  Hgb 8.7- No S/S of bleeding. Specifically denies melena/BRBPR or nosebleeds. No blood in urine.  LDH stable at with established baseline of 280- 380. Denies tea-colored urine. No power elevations noted on interrogation.   [facility]hab at [facility] has not contacted the pt to begin cardiac rehab. I notified them and they had been looking at the wrong pt in the chart and thought the pt was deceased. They were apologetic and are faxing me a referral form to fill out to get [place]  started in their rehab program.  Patient Instructions: 1.  Start Cardiac Rehab. 2. Return to [facility] in 3 weeks.  Tanda Rockers RN [place] Coordinator   Office: [phone] 24/7 Emergency VAD Pager: [phone]

## 2018-02-27 LAB — THYROID PANEL WITH TSH
FREE THYROXINE INDEX: 2.5 (ref 1.2–4.9)
T3 Uptake Ratio: 35 % (ref 24–39)
T4, Total: 7 ug/dL (ref 4.5–12.0)
TSH: 6.07 u[IU]/mL — AB (ref 0.450–4.500)

## 2018-03-03 ENCOUNTER — Ambulatory Visit: Payer: Medicare Other | Admitting: Internal Medicine

## 2018-03-03 ENCOUNTER — Encounter: Payer: Self-pay | Admitting: Internal Medicine

## 2018-03-03 ENCOUNTER — Other Ambulatory Visit: Payer: Self-pay | Admitting: Internal Medicine

## 2018-03-03 VITALS — HR 71 | Ht 68.0 in | Wt 190.0 lb

## 2018-03-03 DIAGNOSIS — I472 Ventricular tachycardia, unspecified: Secondary | ICD-10-CM

## 2018-03-03 DIAGNOSIS — I255 Ischemic cardiomyopathy: Secondary | ICD-10-CM

## 2018-03-03 DIAGNOSIS — I5022 Chronic systolic (congestive) heart failure: Secondary | ICD-10-CM | POA: Diagnosis not present

## 2018-03-03 LAB — CUP PACEART INCLINIC DEVICE CHECK
Battery Voltage: 2.88 V
Brady Statistic AP VP Percent: 0.06 %
Brady Statistic AP VS Percent: 52.33 %
Brady Statistic AS VP Percent: 0.05 %
Brady Statistic AS VS Percent: 47.56 %
Date Time Interrogation Session: 20190429105057
HighPow Impedance: 36 Ohm
HighPow Impedance: 49 Ohm
Implantable Lead Implant Date: 20030714
Implantable Lead Location: 753859
Implantable Lead Location: 753860
Implantable Pulse Generator Implant Date: 20140721
Lead Channel Impedance Value: 399 Ohm
Lead Channel Impedance Value: 475 Ohm
Lead Channel Pacing Threshold Pulse Width: 0.4 ms
Lead Channel Pacing Threshold Pulse Width: 0.4 ms
Lead Channel Sensing Intrinsic Amplitude: 2.25 mV
Lead Channel Setting Pacing Amplitude: 2 V
Lead Channel Setting Pacing Pulse Width: 0.4 ms
Lead Channel Setting Sensing Sensitivity: 0.3 mV
MDC IDC LEAD IMPLANT DT: 20140801
MDC IDC MSMT BATTERY REMAINING LONGEVITY: 44 mo
MDC IDC MSMT LEADCHNL RA PACING THRESHOLD AMPLITUDE: 1 V
MDC IDC MSMT LEADCHNL RA SENSING INTR AMPL: 0.5 mV
MDC IDC MSMT LEADCHNL RV IMPEDANCE VALUE: 589 Ohm
MDC IDC MSMT LEADCHNL RV PACING THRESHOLD AMPLITUDE: 1 V
MDC IDC SET LEADCHNL RA PACING AMPLITUDE: 2 V
MDC IDC STAT BRADY RA PERCENT PACED: 51.37 %
MDC IDC STAT BRADY RV PERCENT PACED: 0.14 %

## 2018-03-03 NOTE — Progress Notes (Signed)
[name] changing her Lasix to 3 times a week      Patient Care Team: Bensimhon, Shaune Pascal, MD as PCP - General (Cardiology) Bensimhon, Shaune Pascal, MD as Consulting Physician (Cardiology) Gaye Pollack, MD as Consulting Physician (Cardiothoracic Surgery)   HPI  [name] is a 77 y.o. male Seen in followup for ventricular tachycardia occurring in the context of ischemic heart disease prior revascularization MI. He has had significant problems with ICD discharge and underwent catheter ablation November 2012. He underwent device revision with insertion of an atrial lead and generator replacement 7/14  He underwent repeat ablation August 2014 He was rendered noninducible at this procedure also. He has had recurrent ventricular tachycardia.   Most recently 10/18 hopsitalized for CHF acute chronic and was found to have VT below the detection rate--he was cardioverted and the amio uptitrated    Catheterization was recently demonstrated two-vessel coronary disease with patent stents in the chronically totally occluded RCA ejection fraction of 25-30% Myoview scan demonstrated a large inferolateral infarct.  Echocardiogram 8/14 demonstrated EF of 15-20%     Date TSH LFTs Hgb PFTs  4/19  6.07 14  9.4  *          There has been a lot going on.  There is greater lassitude.  Anorexia related to chemotherapy for bladder cancer which he has subsequently stopped as it was intolerable.  No interval arrhythmias.  Stable shortness of breath.  No chest pain.     Past Medical History:  Diagnosis Date  . Arthritis   . Automatic implantable cardioverter-defibrillator in situ    a. s/p Medtronic Evera device serial [id number]    . Bradycardia   . CAD (coronary artery disease)    a. s/p MI 1982;  s/p DES to CFX 2011;  s/p NSTEMI 4/12 in setting of VTach;  cath 7/12:  LM ok, LAD mid 30-40%, Dx's with 40%, mCFX stent patent, prox to mid RCA occluded with R to R and L to R collats.  Medical Tx was  continued  . Chronic systolic heart failure ([place])    a. s/p LVAD 10/2013 for destination therapy  . CKD (chronic kidney disease)   . CVD (cerebrovascular disease)    a. s/p L CEA 2008  . Cytopenia   . Dysrhythmia    AFIB  . History of GI bleed    a. on ASA- started on plavix during 10/2015 admission for CVA  . HLD (hyperlipidemia)   . HTN (hypertension)   . Hypothyroidism   . Inappropriate therapy from implantable cardioverter-defibrillator    a. ATP for sinus tach SVT wavelet identified SVT but was no  passive (2014)  . Ischemic cardiomyopathy   . 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 L Ear   . Myocardial infarction ([place])    1992  . PAF (paroxysmal atrial fibrillation) ([place])   . PVD (peripheral vascular disease) ([place])    a. 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 Shortness of breath dyspnea    W/ EXERTION   . Sleep apnea    NO CPAP NOW  HE SENT IT BACK YRS AGO  . Stroke [facility])    a. 10/2015 admission   . Ventricular tachycardia ([place])    a. s/p AICD;   h/o ICD shock;  Amiodarone Rx. S/P ablation in 2012  Past Surgical History:  Procedure Laterality Date  . BACK SURGERY    . CARDIAC CATHETERIZATION     "several" (05/25/2013)  . CARDIAC DEFIBRILLATOR PLACEMENT  2003; 2005; 05/25/2013   2014: Medtronic Evera device serial number [id number]  . CARDIAC ELECTROPHYSIOLOGY STUDY AND ABLATION  2012  . CARDIOVERSION N/A 10/15/2013   Procedure: CARDIOVERSION;  Surgeon: Jolaine Artist, MD;  Location: [place];  Service: Cardiovascular;  Laterality: N/A;  . CARDIOVERSION N/A 11/04/2013   Procedure: CARDIOVERSION;  Surgeon: Larey Dresser, MD;  Location: [place];  Service: Cardiovascular;  Laterality: N/A;  . CARDIOVERSION N/A 06/04/2014   Procedure: CARDIOVERSION;  Surgeon: Jolaine Artist, MD;  Location: [facility] ENDOSCOPY;  Service: Cardiovascular;  Laterality: N/A;  . CARDIOVERSION N/A  03/02/2015   Procedure: CARDIOVERSION;  Surgeon: Jolaine Artist, MD;  Location: [facility] ENDOSCOPY;  Service: Cardiovascular;  Laterality: N/A;  . CARDIOVERSION N/A 01/19/2016   Procedure: CARDIOVERSION;  Surgeon: Larey Dresser, MD;  Location: [place];  Service: Cardiovascular;  Laterality: N/A;  . CAROTID ENDARTERECTOMY Left ~ 2007  . CATARACT EXTRACTION W/ INTRAOCULAR LENS  IMPLANT, BILATERAL Bilateral 2000  . CHOLECYSTECTOMY  12/15/?2010  . COLONOSCOPY WITH PROPOFOL N/A 2020/09/2217   Procedure: COLONOSCOPY WITH PROPOFOL;  Surgeon: Ronald Lobo, MD;  Location: [place];  Service: Endoscopy;  Laterality: N/A;  . CORONARY ANGIOPLASTY  1992  . CORONARY ANGIOPLASTY WITH STENT PLACEMENT     "last one was 11/2012  (05/25/2013)  . CYSTOSCOPY WITH RETROGRADE PYELOGRAM, URETEROSCOPY AND STENT PLACEMENT Left 08/09/2014   Procedure: CYSTOSCOPY WITH RETROGRADE PYELOGRAM, URETEROSCOPY AND STENT PLACEMENT;  Surgeon: Bernestine Amass, MD;  Location: [facility];  Service: Urology;  Laterality: Left;  . CYSTOSCOPY WITH RETROGRADE PYELOGRAM, URETEROSCOPY AND STENT PLACEMENT Left 09/01/2014   Procedure: CYSTOSCOPY WITH URETEROSCOPY AND STENT REMOVAL;  Surgeon: Bernestine Amass, MD;  Location: [facility];  Service: Urology;  Laterality: Left;  . CYSTOSCOPY WITH RETROGRADE PYELOGRAM, URETEROSCOPY AND STENT PLACEMENT Left 09/20/2014   Procedure: CYSTOSCOPY WITH RETROGRADE PYELOGRAM, URETEROSCOPY AND STENT PLACEMENT, stone removal;  Surgeon: Bernestine Amass, MD;  [facility];  Service: Urology;  Laterality: Left;  . CYSTOSCOPY WITH RETROGRADE PYELOGRAM, URETEROSCOPY AND STENT PLACEMENT Bilateral 12/04/2017   Procedure: CYSTOSCOPY WITH BILATERAL RETROGRADE PYELOGRAM, LEFT URETEROSCOPY AND STENT PLACEMENT;  Surgeon: Lucas Mallow, MD;  Location: [place];  Service: Urology;  Laterality: Bilateral;  . DOPPLER ECHOCARDIOGRAPHY  2011  . ELBOW ARTHROSCOPY Right 1990's  . ELECTROPHYSIOLOGIC STUDY N/A 07/19/2016   Procedure:  Cardioversion;  Surgeon: Evans Lance, MD;  Location: [place] CV LAB;  Service: Cardiovascular;  Laterality: N/A;  . ENDARTERECTOMY Right 01/13/2016   Procedure: RIGHT CAROTID ARTERY ENDARTERECTOMY;  Surgeon: Mal Misty, MD;  Location: [place];  Service: Vascular;  Laterality: Right;  . ENTEROSCOPY Left 02/23/2017   Procedure: ENTEROSCOPY;  Surgeon: Ronald Lobo, MD;  Location: [facility] ENDOSCOPY;  Service: Endoscopy;  Laterality: Left;  . ENTEROSCOPY N/A 10/17/2017   Procedure: ENTEROSCOPY;  Surgeon: Yetta Flock, MD;  Location: [facility] ENDOSCOPY;  Service: Gastroenterology;  Laterality: N/A;  LVAD pt.    . ESOPHAGOGASTRODUODENOSCOPY N/A 02/15/2015   Procedure: ESOPHAGOGASTRODUODENOSCOPY (EGD);  Surgeon: Carol Ada, MD;  Location: [facility] ENDOSCOPY;  Service: Endoscopy;  Laterality: N/A;  LVAD  . EYE SURGERY     VITRECTOMY  LEFT 05/2012  . FEMORAL-POPLITEAL BYPASS GRAFT Right 2011  . FEMORAL-POPLITEAL BYPASS GRAFT Left 05/2002   Archie Endo 05/06/2002 (05/25/2013)  . HEEL SPUR SURGERY Right 1990's  .  HOLMIUM LASER APPLICATION Left 38/08/1750   Procedure: HOLMIUM LASER APPLICATION;  Surgeon: Bernestine Amass, MD;  [facility];  Service: Urology;  Laterality: Left;  . HOLMIUM LASER APPLICATION Left 0/25/8527   Procedure: HOLMIUM LASER APPLICATION;  Surgeon: Lucas Mallow, MD;  Location: [place];  Service: Urology;  Laterality: Left;  . IMPLANTABLE CARDIOVERTER DEFIBRILLATOR GENERATOR CHANGE N/A 05/25/2013   Procedure: IMPLANTABLE CARDIOVERTER DEFIBRILLATOR GENERATOR CHANGE;  Surgeon: Deboraha Sprang, MD;  Location: [place] CATH LAB;  Service: Cardiovascular;  Laterality: N/A;  . INSERTION OF IMPLANTABLE LEFT VENTRICULAR ASSIST DEVICE N/A 10/19/2013   Procedure: INSERTION OF IMPLANTABLE LEFT VENTRICULAR ASSIST DEVICE;  Surgeon: Gaye Pollack, MD;  Location: [place];  Service: Open Heart Surgery;  Laterality: N/A;  CIRC ARREST  NITRIC OXIDE  MEDTRONIC ICD  . INTRAOPERATIVE TRANSESOPHAGEAL ECHOCARDIOGRAM  N/A 10/19/2013   Procedure: INTRAOPERATIVE TRANSESOPHAGEAL ECHOCARDIOGRAM;  Surgeon: Gaye Pollack, MD;  Location: [facility];  Service: Open Heart Surgery;  Laterality: N/A;  . IR FLUORO GUIDE CV LINE RIGHT  12/10/2017  . IR US GUIDE VASC ACCESS RIGHT  12/10/2017  . KNEE ARTHROSCOPY Bilateral 1990's  . LEAD REVISION N/A 06/05/2013   Procedure: LEAD REVISION;  Surgeon: Thompson Grayer, MD;  Location: [facility] C[facility] LAB;  Service: Cardiovascular;  Laterality: N/A;  . LEFT HEART CATHETERIZATION WITH CORONARY ANGIOGRAM N/A 11/24/2012   Procedure: LEFT HEART CATHETERIZATION WITH CORONARY ANGIOGRAM;  Surgeon: Peter M Martinique, MD;  Location: [facility] CATH LAB;  Service: Cardiovascular;  Laterality: N/A;  . LIPOMA EXCISION Left 2013   "near ear" (05/25/2013)  . [place]; 2000's  . PATCH ANGIOPLASTY Right 01/13/2016   Procedure: WITH  PATCH ANGIOPLASTY;  Surgeon: Mal Misty, MD;  Location: [place];  Service: Vascular;  Laterality: Right;  . RENAL ARTERY BYPASS Bilateral 2003  . REVASCULARIZATION / IN-SITU GRAFT LEG    . RIGHT HEART CATH N/A 11/29/2017   Procedure: RIGHT HEART CATH;  Surgeon: Jolaine Artist, MD;  Location: [place] CV LAB;  Service: Cardiovascular;  Laterality: N/A;  . RIGHT HEART CATHETERIZATION N/A 09/17/2013   Procedure: RIGHT HEART CATH;  Surgeon: Jolaine Artist, MD;  Location: [facility] CATH LAB;  Service: Cardiovascular;  Laterality: N/A;  . RIGHT HEART CATHETERIZATION N/A 10/14/2013   Procedure: RIGHT HEART CATH;  Surgeon: Jolaine Artist, MD;  Location: [facility] CATH LAB;  Service: Cardiovascular;  Laterality: N/A;  . RIGHT HEART CATHETERIZATION N/A 11/16/2013   Procedure: RIGHT HEART CATH;  Surgeon: Jolaine Artist, MD;  Location: [facility] CATH LAB;  Service: Cardiovascular;  Laterality: N/A;  . RIGHT HEART CATHETERIZATION N/A 07/13/2014   Procedure: RIGHT HEART CATH;  Surgeon: Jolaine Artist, MD;  Location: [facility] CATH LAB;  Service: Cardiovascular;  Laterality: N/A;  . TEE WITHOUT  CARDIOVERSION N/A 11/04/2013   Procedure: TRANSESOPHAGEAL ECHOCARDIOGRAM (TEE);  Surgeon: Larey Dresser, MD;  Location: [place];  Service: Cardiovascular;  Laterality: N/A;  . TEE WITHOUT CARDIOVERSION N/A 03/02/2015   Procedure: TRANSESOPHAGEAL ECHOCARDIOGRAM (TEE);  Surgeon: Jolaine Artist, MD;  Location: [facility] ENDOSCOPY;  Service: Cardiovascular;  Laterality: N/A;  . TEE WITHOUT CARDIOVERSION N/A 01/19/2016   Procedure: TRANSESOPHAGEAL ECHOCARDIOGRAM (TEE);  Surgeon: Larey Dresser, MD;  Location: [place];  Service: Cardiovascular;  Laterality: N/A;  . TONSILLECTOMY  1946  . TRANSURETHRAL RESECTION OF BLADDER TUMOR N/A 12/04/2017   Procedure: TRANSURETHRAL RESECTION OF BLADDER TUMOR (TURBT);  Surgeon: Lucas Mallow, MD;  Location: [place];  Service: Urology;  Laterality: N/A;  . TUMOR EXCISION Left 1960's   "fatty tumor" (05/25/2013)  . V-TACH ABLATION N/A 09/12/2011   Procedure: V-TACH ABLATION;  Surgeon: Evans Lance, MD;  Location: [facility] CATH LAB;  Service: Cardiovascular;  Laterality: N/A;  . V-TACH ABLATION N/A 06/08/2013   Procedure: V-TACH ABLATION;  Surgeon: Evans Lance, MD;  Location: [facility] CATH LAB;  Service: Cardiovascular;  Laterality: N/A;    Current Outpatient Medications  Medication Sig Dispense Refill  . amiodarone (PACERONE) 200 MG tablet Take 200 mg by mouth daily.    [name] atorvastatin (LIPITOR) 80 MG tablet TAKE 1 TABLET DAILY AT 6 PM (Patient taking differently: TAKE 1 TABLET (80 mg) DAILY in the morning) 30 tablet 11  . docusate sodium (COLACE) 100 MG capsule Take 200 mg by mouth every morning.     . fexofenadine (ALLEGRA) 60 MG tablet Take 60 mg by mouth as needed for allergies or rhinitis.    . furosemide (LASIX) 20 MG tablet Take 20 mg by mouth daily as needed for fluid.    [name] levothyroxine (SYNTHROID, LEVOTHROID) 150 MCG tablet Take 1 tablet (150 mcg total) by mouth daily before breakfast. 90 tablet 3  . Multiple Vitamins-Minerals (ICAPS PO) Take 1 tablet by  mouth 2 (two) times daily.    . pantoprazole (PROTONIX) 40 MG tablet Take 1 tablet (40 mg total) by mouth 2 (two) times daily. 180 tablet 6  . ranolazine (RANEXA) 500 MG 12 hr tablet Take 1 tablet (500 mg total) by mouth 2 (two) times daily. 60 tablet 11  . sildenafil (REVATIO) 20 MG tablet Take 1 tablet (20 mg total) by mouth 3 (three) times daily. 90 tablet 3  . temazepam (RESTORIL) 30 MG capsule Take 1 capsule (30 mg total) by mouth at bedtime as needed for sleep. 30 capsule 3  . warfarin (COUMADIN) 2 MG tablet Take 2 mg (1 tablet) daily except 1 mg (1/2 tablet) on Monday    . zinc gluconate 50 MG tablet Take 50 mg by mouth 2 (two) times daily.      No current facility-administered medications for this visit.     Allergies  Allergen Reactions  . Demerol [Meperidine] Other (See Comments)    Paralysis. Could only move eyes.     Review of Systems negative except from HPI and PMH  Physical Exam Pulse 71   Ht 5\' 8"  (1.727 m)   Wt 190 lb (86.2 kg)   SpO2 99%   BMI 28.89 kg/m  Well developed and nourished in no acute distress HENT normal Neck supple with JVP-flat Clear Regular rate and rhythm, LVAD hum Abd-soft with active BS No Clubbing cyanosis edema Skin-warm and dry A & Oriented  Grossly normal sensory and motor function  ECG A pacing with intrinsic conduction 30/11/46  Assessment and  Plan  CHF -chronic systolic   ICD-Medtronic The patient's device was interrogated and the information was fully reviewed.  The device was reprogrammed As Below  LVAD  ISCHEMIC CARDIOMYOPATHY  TIA/CVA  VT  AFib  Loss of R waves-ICD  Treated hypothyroidism  Sinus node dysfunction   On amio  and tolerating with normal surveillance labs (mild increase TSH) No intercurrent Ventricular tachycardia  No intercurrent atrial fibrillation or flutter  Without symptoms of ischemia  Talk with Dr. Reine Just.  We have reprogrammed the device to offer ATP for slow VT.  There are no shocks  until greater than 200 bpm. Tachy detection >>100   We spent more than 50% of  our >25 min visit in face to face counseling regarding the above

## 2018-03-03 NOTE — Patient Instructions (Signed)
Medication Instructions:  Your physician recommends that you continue on your current medications as directed. Please refer to the Current Medication list given to you today.  Labwork: None ordered.  Testing/Procedures: None ordered.  Follow-Up: Your physician wants you to follow-up in: One Year with Dr Klein. You will receive a reminder letter in the mail two months in advance. If you don't receive a letter, please call our office to schedule the follow-up appointment.  Remote monitoring is used to monitor your Pacemaker of ICD from home. This monitoring reduces the number of office visits required to check your device to one time per year. It allows us to keep an eye on the functioning of your device to ensure it is working properly. You are scheduled for a device check from home on 04/02/2018. You may send your transmission at any time that day. If you have a wireless device, the transmission will be sent automatically. After your physician reviews your transmission, you will receive a postcard with your next transmission date.   Any Other Special Instructions Will Be Listed Below (If Applicable).     If you need a refill on your cardiac medications before your next appointment, please call your pharmacy.  

## 2018-03-04 NOTE — Addendum Note (Signed)
Addended by: Campbell Riches on: 03/04/2018 01:22 PM   Modules accepted: Orders

## 2018-03-05 ENCOUNTER — Ambulatory Visit (HOSPITAL_COMMUNITY): Payer: Self-pay | Admitting: Pharmacist

## 2018-03-05 LAB — POCT INR: INR: 1.8

## 2018-03-13 ENCOUNTER — Ambulatory Visit (HOSPITAL_COMMUNITY): Payer: Self-pay | Admitting: Pharmacist

## 2018-03-13 LAB — POCT INR: INR: 2.1

## 2018-03-19 ENCOUNTER — Ambulatory Visit (HOSPITAL_COMMUNITY): Payer: Self-pay | Admitting: Pharmacist

## 2018-03-19 LAB — POCT INR: INR: 2

## 2018-03-25 ENCOUNTER — Other Ambulatory Visit (HOSPITAL_COMMUNITY): Payer: Self-pay | Admitting: Unknown Physician Specialty

## 2018-03-25 DIAGNOSIS — Z7901 Long term (current) use of anticoagulants: Secondary | ICD-10-CM

## 2018-03-25 DIAGNOSIS — Z95811 Presence of heart assist device: Secondary | ICD-10-CM

## 2018-03-26 ENCOUNTER — Inpatient Hospital Stay (HOSPITAL_COMMUNITY): Admission: RE | Admit: 2018-03-26 | Payer: Medicare Other | Source: Ambulatory Visit

## 2018-03-26 ENCOUNTER — Encounter (HOSPITAL_COMMUNITY): Payer: Self-pay

## 2018-03-26 ENCOUNTER — Ambulatory Visit (HOSPITAL_COMMUNITY)
Admission: RE | Admit: 2018-03-26 | Discharge: 2018-03-26 | Disposition: A | Discharge status: Home / Self Care | Payer: Medicare Other | Source: Ambulatory Visit | Attending: Internal Medicine | Admitting: Internal Medicine

## 2018-03-26 ENCOUNTER — Ambulatory Visit (HOSPITAL_COMMUNITY): Payer: Self-pay | Admitting: Pharmacist

## 2018-03-26 VITALS — BP 110/0 | HR 60 | Ht 72.0 in | Wt 189.4 lb

## 2018-03-26 DIAGNOSIS — I252 Old myocardial infarction: Secondary | ICD-10-CM | POA: Diagnosis not present

## 2018-03-26 DIAGNOSIS — Z7902 Long term (current) use of antithrombotics/antiplatelets: Secondary | ICD-10-CM | POA: Diagnosis not present

## 2018-03-26 DIAGNOSIS — E785 Hyperlipidemia, unspecified: Secondary | ICD-10-CM | POA: Insufficient documentation

## 2018-03-26 DIAGNOSIS — G4730 Sleep apnea, unspecified: Secondary | ICD-10-CM | POA: Insufficient documentation

## 2018-03-26 DIAGNOSIS — Z955 Presence of coronary angioplasty implant and graft: Secondary | ICD-10-CM | POA: Insufficient documentation

## 2018-03-26 DIAGNOSIS — Z7901 Long term (current) use of anticoagulants: Secondary | ICD-10-CM | POA: Diagnosis not present

## 2018-03-26 DIAGNOSIS — Z79890 Hormone replacement therapy (postmenopausal): Secondary | ICD-10-CM | POA: Diagnosis not present

## 2018-03-26 DIAGNOSIS — I471 Supraventricular tachycardia: Secondary | ICD-10-CM | POA: Diagnosis not present

## 2018-03-26 DIAGNOSIS — Z79899 Other long term (current) drug therapy: Secondary | ICD-10-CM | POA: Insufficient documentation

## 2018-03-26 DIAGNOSIS — Z95811 Presence of heart assist device: Secondary | ICD-10-CM | POA: Insufficient documentation

## 2018-03-26 DIAGNOSIS — Z8673 Personal history of transient ischemic attack (TIA), and cerebral infarction without residual deficits: Secondary | ICD-10-CM | POA: Insufficient documentation

## 2018-03-26 DIAGNOSIS — I480 Paroxysmal atrial fibrillation: Secondary | ICD-10-CM | POA: Insufficient documentation

## 2018-03-26 DIAGNOSIS — J069 Acute upper respiratory infection, unspecified: Secondary | ICD-10-CM | POA: Diagnosis not present

## 2018-03-26 DIAGNOSIS — E46 Unspecified protein-calorie malnutrition: Secondary | ICD-10-CM | POA: Diagnosis not present

## 2018-03-26 DIAGNOSIS — Z87442 Personal history of urinary calculi: Secondary | ICD-10-CM | POA: Diagnosis not present

## 2018-03-26 DIAGNOSIS — N179 Acute kidney failure, unspecified: Secondary | ICD-10-CM | POA: Insufficient documentation

## 2018-03-26 DIAGNOSIS — N183 Chronic kidney disease, stage 3 (moderate): Secondary | ICD-10-CM | POA: Insufficient documentation

## 2018-03-26 DIAGNOSIS — I739 Peripheral vascular disease, unspecified: Secondary | ICD-10-CM | POA: Diagnosis not present

## 2018-03-26 DIAGNOSIS — R5383 Other fatigue: Secondary | ICD-10-CM | POA: Diagnosis not present

## 2018-03-26 DIAGNOSIS — I2510 Atherosclerotic heart disease of native coronary artery without angina pectoris: Secondary | ICD-10-CM | POA: Diagnosis not present

## 2018-03-26 DIAGNOSIS — G4700 Insomnia, unspecified: Secondary | ICD-10-CM | POA: Diagnosis not present

## 2018-03-26 DIAGNOSIS — I5023 Acute on chronic systolic (congestive) heart failure: Secondary | ICD-10-CM | POA: Diagnosis not present

## 2018-03-26 DIAGNOSIS — Z85820 Personal history of malignant melanoma of skin: Secondary | ICD-10-CM | POA: Diagnosis not present

## 2018-03-26 DIAGNOSIS — D494 Neoplasm of unspecified behavior of bladder: Secondary | ICD-10-CM

## 2018-03-26 DIAGNOSIS — E039 Hypothyroidism, unspecified: Secondary | ICD-10-CM | POA: Diagnosis not present

## 2018-03-26 DIAGNOSIS — M1990 Unspecified osteoarthritis, unspecified site: Secondary | ICD-10-CM | POA: Insufficient documentation

## 2018-03-26 DIAGNOSIS — I255 Ischemic cardiomyopathy: Secondary | ICD-10-CM | POA: Insufficient documentation

## 2018-03-26 DIAGNOSIS — I130 Hypertensive heart and chronic kidney disease with heart failure and stage 1 through stage 4 chronic kidney disease, or unspecified chronic kidney disease: Secondary | ICD-10-CM | POA: Insufficient documentation

## 2018-03-26 LAB — CBC
HCT: 30.7 % — ABNORMAL LOW (ref 39.0–52.0)
Hemoglobin: 9.1 g/dL — ABNORMAL LOW (ref 13.0–17.0)
MCH: 24.5 pg — AB (ref 26.0–34.0)
MCHC: 29.6 g/dL — AB (ref 30.0–36.0)
MCV: 82.5 fL (ref 78.0–100.0)
PLATELETS: 211 10*3/uL (ref 150–400)
RBC: 3.72 MIL/uL — ABNORMAL LOW (ref 4.22–5.81)
RDW: 20.6 % — AB (ref 11.5–15.5)
WBC: 6.2 10*3/uL (ref 4.0–10.5)

## 2018-03-26 LAB — PROTIME-INR
INR: 2.17
PROTHROMBIN TIME: 24 s — AB (ref 11.4–15.2)

## 2018-03-26 LAB — COMPREHENSIVE METABOLIC PANEL
ALK PHOS: 150 U/L — AB (ref 38–126)
ALT: 15 U/L — ABNORMAL LOW (ref 17–63)
ANION GAP: 8 (ref 5–15)
AST: 25 U/L (ref 15–41)
Albumin: 3.5 g/dL (ref 3.5–5.0)
BILIRUBIN TOTAL: 0.8 mg/dL (ref 0.3–1.2)
BUN: 26 mg/dL — AB (ref 6–20)
CALCIUM: 8.6 mg/dL — AB (ref 8.9–10.3)
CO2: 25 mmol/L (ref 22–32)
Chloride: 102 mmol/L (ref 101–111)
Creatinine, Ser: 1.17 mg/dL (ref 0.61–1.24)
GFR calc Af Amer: >60 mL/min (ref >60)
GFR calc non Af Amer: 58 mL/min — ABNORMAL LOW (ref >60)
GLUCOSE: 131 mg/dL — AB (ref 65–99)
Potassium: 3.8 mmol/L (ref 3.5–5.1)
Sodium: 135 mmol/L (ref 135–145)
TOTAL PROTEIN: 7.6 g/dL (ref 6.5–8.1)

## 2018-03-26 LAB — PREALBUMIN: PREALBUMIN: 12.7 mg/dL — AB (ref 18–38)

## 2018-03-26 LAB — LACTATE DEHYDROGENASE: LDH: 317 U/L — AB (ref 98–192)

## 2018-03-26 MED ORDER — DOXYCYCLINE HYCLATE 50 MG PO CAPS: 100.0000 mg | ORAL_CAPSULE | Freq: Two times a day (BID) | ORAL | 0 refills | Status: DC

## 2018-03-26 NOTE — Patient Instructions (Signed)
1. Doxycycline 100 mg twice daily for upper respiratory infection. 2. Take Lasix 20 mg daily for 2 days. 3. Return to [place] clinic in one month.

## 2018-03-26 NOTE — Progress Notes (Addendum)
Patient presents for 3 week follow up in [facility] today with caregiver, [name].  Reports no problems with VAD equipment or concerns with drive line exit site.   Pt is still c/o fatigue, SOB and weakness. He presents in wheelchair; says he gets SOB just walking room to room at home. He has had a "cold" for one week with productive cough, green sputum; plus chest and nasal congestion. No fevers or chills.   Pt also reports his appetite is poor. [name] says he eats very little and takes a lot of encouragement. His overall activity down due to SOB.   Pt is scheduled for Octreotide injection in Short Stay later today; wife shared it is costing patient 700.00 out of pocket each month and asked Dr. Haroldine Laws if absolutely necessary? Dr. Haroldine Laws advises we can stop for now and monitor for any future GI bleeding.   Vital Signs:  Doppler Pressure: 110 - reflecting modified systolic Automatc BP:  092/33 (89) HR: 60 SPO2: 94% RA  Weight: 189.4 lb w/o eqt Last weight: 190.6  lb  VAD Indication: Destination Therapy- age excluding    VAD interrogation & Equipment Management: Speed:8800 Flow:  3.8 Power: 4.4w    PI: 8.0  Alarms: no clinical alarms Events: rare PI event  Fixed speed 8800 Low speed limit: 8200  Primary Controller: Replace back up battery in 30 months Back up controller: Replace back up battery in 65months  Exit Site Care: Drive line is being maintained weekly by [name]. Drive line exit site well healed and incorporated. The velour is fully implanted at exit site. Dressing dry and intact. No erythema or drainage. Stabilization device present and accurately applied. Pt denies fever or chills. Pt given 4 dressing kits today.  Significant Events on VAD Support:  02/2015 >>GIB (removed ASA, 2-2.5 INR) 10/2015 >>CVA(Plavix started) 02/2017> GIB (removed ASA, scopes negative) 03/2017> percutaneous lead repair 12/2017> Bladder CA  Device:Medtronic Therapies: on 200  bpm AT monitor: 140 bpm Last check: 03/03/18  BP & Labs:  MAP 110 - Doppler is reflecting modified systolic  Hgb 9.1- No S/S of bleeding. Specifically denies melena/BRBPR or nosebleeds. No blood in urine.  LDH stable at 317with established baseline of 280-380. Denies tea-colored urine. No power elevations noted on interrogation.   Cardiac Rehab at [facility] will not provide cardiac rehab for patient, stating they are not trained to assist LVAD patient.   5.5 year Intermacs follow up completed including:  Quality of Life, KCCQ-12, and Neurocognitive trail making.   Pt unable to perform 6 min walk due to weakness, SOB.   Back up controller: [name] performs back up battery charges at home.    Patient Instructions: 1. Doxycycline 100 mg twice daily for upper respiratory infection. 2. Take Lasix 20 mg daily for 2 days. 3. Return to [place] clinic in one month.   Zada Girt RN [place] Coordinator   Office: [phone] 24/7 Emergency VAD Pager: [phone]

## 2018-03-28 NOTE — Progress Notes (Signed)
Patient ID: [name], male   DOB: 1941/01/30, 77 y.o.   MRN: [id number]   VAD CLINIC PROGRESS NOTE  PCP: Dr. Kandice Robinsons ([place]) GU: Dr Gloriann Loan Neuro Surgeon: Dr Vertell Limber Vascular: Dr Kellie Simmering Primary HF: Dr Haroldine Laws  [name] is a 77 yo male with h/o VT, PAD and severe systolic HF (EF 66-06%) due to mixed ischemic/nonischemic CM he is s/p HM II LVAD implant for destination therapy on October 19, 2013.VAD implantation was complicated by VT, syncope, AF/Aflutter and RHF. Required short term milrinone.    GU Event 09/01/14 had impacted ureteral stone and underwent cystoscopy and flexible ureteroscopy with lithotripsy and basket extraction.  09/2015 Hematuria and chronic nephrolithiasis. Renal US showed mild hyronephrosis and bilateral renal cysts. Dr. Risa Grill took back for repeat lithotripsy and stone extraction by ureteroscopy and J stent placement. 12/18 Recurrent hematuria. 58mm left ureteral stone. Passed with tamulosin 1/19   Admitted with recurrent hematuria with ureteral stone. Stone removed but found to have large bladder mass which was removed. Course c/b obstructive uropathy and AKI requiring 1 session of HD.  3/19 Bladder chemo stopped after 2 treatments as unable to tolerate  GI Event  02/2015 - Hgb 5.4 --> EGD that showed 2 AVMs in the jejunum that were clipped.  4/2-18 - Episode of melena. Hgb 10.9-> 8.7. EGD/colonoscopy negative 12/18 - Melena with syncope  Hgb 6.5. Transfused 5u. 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  01/2016 R CEA. He was started on Plavix.  01/2016 Lumbar fusion L3-L5 with pedicle screws posterolateral arthrodesis with BMP, allograft  VT events 9/17 with high fevers and productive cough. Treated with levaquin x 7 days for acute bronchitis. Also found to have VT and underwent DC-CV. Seen by EP and amio increased 10/17 speed turned down to 8800 due to RV failure. 10/18 with slow VT and ADHF.Cardioverted in ED and  amio uptitrated.   Significant VAD Events  03/2017>percutaneous lead repair   Follow up LVAD Clinic: He returns today with [name] for f/u. Not feeling well at all. Says he has had upper respiratory congestion with SOB and cough productive of green sputum for a bout a week. Seems to be getting worse. No fevers/chills. Sinuses full. Gets SOB going from room to room. Had some edema and took extra lasix. No orthopnea or PND. Appetite poor. Weight down a pound. Had to stop bladder BCG treatments due to severe fatigue.Denies problems with driveline. No bleeding, melena or neuro symptoms. No VAD alarms. Taking all meds as prescribed.  [name]  VAD Indication: Destination Therapy- age excluding    VAD interrogation & Equipment Management: Speed:8800 Flow:  3.8 Power: 4.4w PI: 8.0  Alarms: no clinical alarms Events: rare PI event  Fixed speed 8800 Low speed limit: 8200  Primary Controller: Replace back up battery in 30 months Back up controller: Replace back up battery in 22months  :   Exit Site Care: Drive line is being maintained weekly by [name]. Drive line exit site well healed and incorporated. The velour is fully implanted at exit site. Dressing dry and intact. No erythema or drainage. Stabilization device present and accurately applied. Pt denies fever or chills. Pt given 6 weekly dressing kits at this visit.     Past Medical History:  Diagnosis Date  . Arthritis   . Automatic implantable cardioverter-defibrillator in situ    a. s/p Medtronic Evera device serial [id number]    . Bradycardia   . CAD (coronary  artery disease)    a. s/p MI 1982;  s/p DES to CFX 2011;  s/p NSTEMI 4/12 in setting of VTach;  cath 7/12:  LM ok, LAD mid 30-40%, Dx's with 40%, mCFX stent patent, prox to mid RCA occluded with R to R and L to R collats.  Medical Tx was continued  . Chronic systolic heart failure ([place])    a. s/p LVAD 10/2013 for destination therapy  . CKD (chronic kidney disease)    . CVD (cerebrovascular disease)    a. s/p L CEA 2008  . Cytopenia   . Dysrhythmia    AFIB  . History of GI bleed    a. on ASA- started on plavix during 10/2015 admission for CVA  . HLD (hyperlipidemia)   . HTN (hypertension)   . Hypothyroidism   . Inappropriate therapy from implantable cardioverter-defibrillator    a. ATP for sinus tach SVT wavelet identified SVT but was no  passive (2014)  . Ischemic cardiomyopathy   . Melanoma of ear ([place])    a. L Ear   . Myocardial infarction ([place])    1992  . PAF (paroxysmal atrial fibrillation) ([place])   . PVD (peripheral vascular disease) ([place])    a. 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 Shortness of breath dyspnea    W/ EXERTION   . Sleep apnea    NO CPAP NOW  HE SENT IT BACK YRS AGO  . Stroke [facility])    a. 10/2015 admission   . Ventricular tachycardia ([place])    a. s/p AICD;   h/o ICD shock;  Amiodarone Rx. S/P ablation in 2012    Current Outpatient Medications  Medication Sig Dispense Refill  . amiodarone (PACERONE) 200 MG tablet Take 200 mg by mouth daily.    [name] atorvastatin (LIPITOR) 80 MG tablet TAKE 1 TABLET DAILY AT 6 PM (Patient taking differently: TAKE 1 TABLET (80 mg) DAILY in the morning) 30 tablet 11  . docusate sodium (COLACE) 100 MG capsule Take 200 mg by mouth every morning.     . furosemide (LASIX) 20 MG tablet Take 20 mg by mouth daily as needed for fluid.    [name] levothyroxine (SYNTHROID, LEVOTHROID) 150 MCG tablet Take 1 tablet (150 mcg total) by mouth daily before breakfast. 90 tablet 3  . Multiple Vitamins-Minerals (ICAPS PO) Take 1 tablet by mouth 2 (two) times daily.    . pantoprazole (PROTONIX) 40 MG tablet Take 1 tablet (40 mg total) by mouth 2 (two) times daily. 180 tablet 6  . ranolazine (RANEXA) 500 MG 12 hr tablet Take 1 tablet (500 mg total) by mouth 2 (two) times daily. 60 tablet 11  . sildenafil (REVATIO) 20 MG tablet  Take 1 tablet (20 mg total) by mouth 3 (three) times daily. 90 tablet 3  . temazepam (RESTORIL) 30 MG capsule Take 1 capsule (30 mg total) by mouth at bedtime as needed for sleep. 30 capsule 3  . warfarin (COUMADIN) 2 MG tablet Take 2 mg by mouth daily.     [name] zinc gluconate 50 MG tablet Take 50 mg by mouth 2 (two) times daily.     [name] doxycycline (VIBRAMYCIN) 50 MG capsule Take 2 capsules (100 mg total) by mouth 2 (two) times daily. 28 capsule 0  . fexofenadine (ALLEGRA) 60 MG tablet Take 60 mg by mouth as needed  for allergies or rhinitis.     No current facility-administered medications for this encounter.     Demerol [meperidine]  REVIEW OF SYSTEMS: All systems negative except as listed in HPI, PMH and Problem list.   Vital Signs:  Doppler Pressure: 110 - reflecting modified systolic Automatc BP:  081/44 (89) HR: 60 SPO2: 94% RA  Weight: 189.4 lb w/o eqt Last weight: 190.6  lb     Physical exam: General:  Weak appearing. congested HEENT: normal  Neck: supple. JVP 8-9  Carotids 2+ bilat; no bruits. No lymphadenopathy or thryomegaly appreciated. Cor: LVAD hum.  Lungs: Clear. No wheeze Abdomen:  soft, nontender, non-distended. No hepatosplenomegaly. No bruits or masses. Good bowel sounds. Driveline site clean. Anchor in place.  Extremities: no cyanosis, clubbing, rash. Warm  1+ edema  Neuro: alert & oriented x 3. No focal deficits. Moves all 4 without problem    ASSESSMENT AND PLAN:  1. URI - Has had for about 1 week. Now getting worse. Will treat with doxy 100 bid x 7 days 2. 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  - Recent RHC in 1/19 with mildly low output. Started on milrinone without benefit.  - Volume up today. REDS vest 42% c/w increased lung congestion - Will have him take lasix for 2 days and assess response - Currently NYHA III-IIIB.  - VAD interrogated personally. Parameters stable. (LVAD speed reduced to 8800 in 10/17 due to RV failure) - s/p  previous driveline repair. No further pump stops 3. 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  - Has had 2 BCG infusions with Dr. Gloriann Loan but discontinued due to inability to tolerate 4. Acute on CKD, stage III:   - Had ESRD in setting of obstructive uropathy and ATN. Required HD x1 - creatinine stable at 1.2 today  5. 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 Continue coumadin and plavix.(with h/o CVA). - Admitted 4/18 with recurrent GIB. Transfused 2u. EGD/colon negative - Admitted 12./18. hgb 6.5, Transfused 5u. EGD with 2 AVMs treated with APC - No further bleeding.  - Hgb 9.1 today - Has been getting octreotide infusions but too expensive for him. Will hold for now  6. Atrial arrhythmias: Atrial fibrillation, atrial tachycardia.  S/p DC-CV 06/04/14, DC-CV 03/02/15 and 01/19/16.  - Remains in NSR.  Continue low-dose amio.  7. HTN:  - Blood pressure well controlled. Continue current regimen. 8. LVAD:  - VAD interrogated personally. Parameters stable. - LDH 317 9. Anticoagulation management:  - Goal INR 2-2.5 IN R2.17 Discussed dosing with PharmD personally. 10. Lumbar compression fracture:  s/p L3-L5 fusion 02/03/16. - back pain resolved 11. 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  Continue atorvastatin. (also warfarin INR goal 2-2.5). S/p R CEA 01/13/16. Plavix stopped 12/18 due to recurrent GIB 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 Forde Dandy in past.  - Amio restarted due to recurrent atrial arrhythmias and VT.  - Recent FT4 elevated. Synthroid cut to 150 daily in 12/18. Recheck next visit. May be contributing to fatigue.   13. VT  - underwent DC-CV 9/17.  - repeat episode of slow VT 08/17/17. Requiring DC-CV. Amio increased for 1 week  - continue amio 200 daily - Keep K > 4.0 Mg > 2.0 14. Insomnia - can use temazepam 30 qhs PRN 15. Protein-calorie  malnutrition - prealbumin 12.7 today in setting of acute URI.  Hopefully will improve as appetite improves  Total time spent 45 minutes. Over half that time spent discussing above.   Frank Pressley,MD 11:39 PM

## 2018-03-30 NOTE — Addendum Note (Signed)
Encounter addended by: Jolaine Artist, MD on: 03/30/2018 10:29 PM  Actions taken: Charge Capture section accepted

## 2018-04-01 ENCOUNTER — Ambulatory Visit: Payer: Medicare Other | Admitting: Internal Medicine

## 2018-04-01 ENCOUNTER — Telehealth: Payer: Self-pay | Admitting: Cardiology

## 2018-04-01 ENCOUNTER — Ambulatory Visit (HOSPITAL_COMMUNITY): Payer: Self-pay | Admitting: Pharmacist

## 2018-04-01 ENCOUNTER — Encounter (HOSPITAL_COMMUNITY): Payer: Self-pay | Admitting: Emergency Medicine

## 2018-04-01 ENCOUNTER — Emergency Department (HOSPITAL_COMMUNITY)
Admission: EM | Admit: 2018-04-01 | Discharge: 2018-04-01 | Disposition: A | Discharge status: Home / Self Care | Payer: Medicare Other | Attending: Emergency Medicine | Admitting: Emergency Medicine

## 2018-04-01 ENCOUNTER — Emergency Department (HOSPITAL_COMMUNITY): Payer: Medicare Other

## 2018-04-01 ENCOUNTER — Encounter: Payer: Self-pay | Admitting: Internal Medicine

## 2018-04-01 VITALS — HR 121 | Ht 72.0 in | Wt 180.0 lb

## 2018-04-01 DIAGNOSIS — I5023 Acute on chronic systolic (congestive) heart failure: Secondary | ICD-10-CM | POA: Insufficient documentation

## 2018-04-01 DIAGNOSIS — Z7901 Long term (current) use of anticoagulants: Secondary | ICD-10-CM | POA: Insufficient documentation

## 2018-04-01 DIAGNOSIS — I472 Ventricular tachycardia, unspecified: Secondary | ICD-10-CM

## 2018-04-01 DIAGNOSIS — I130 Hypertensive heart and chronic kidney disease with heart failure and stage 1 through stage 4 chronic kidney disease, or unspecified chronic kidney disease: Secondary | ICD-10-CM | POA: Insufficient documentation

## 2018-04-01 DIAGNOSIS — Z79899 Other long term (current) drug therapy: Secondary | ICD-10-CM | POA: Diagnosis not present

## 2018-04-01 DIAGNOSIS — E039 Hypothyroidism, unspecified: Secondary | ICD-10-CM | POA: Diagnosis not present

## 2018-04-01 DIAGNOSIS — I252 Old myocardial infarction: Secondary | ICD-10-CM | POA: Diagnosis not present

## 2018-04-01 DIAGNOSIS — Z87891 Personal history of nicotine dependence: Secondary | ICD-10-CM | POA: Diagnosis not present

## 2018-04-01 DIAGNOSIS — Z8673 Personal history of transient ischemic attack (TIA), and cerebral infarction without residual deficits: Secondary | ICD-10-CM | POA: Insufficient documentation

## 2018-04-01 DIAGNOSIS — N183 Chronic kidney disease, stage 3 (moderate): Secondary | ICD-10-CM | POA: Insufficient documentation

## 2018-04-01 DIAGNOSIS — Z95811 Presence of heart assist device: Secondary | ICD-10-CM | POA: Diagnosis not present

## 2018-04-01 DIAGNOSIS — I2510 Atherosclerotic heart disease of native coronary artery without angina pectoris: Secondary | ICD-10-CM | POA: Insufficient documentation

## 2018-04-01 DIAGNOSIS — I5022 Chronic systolic (congestive) heart failure: Secondary | ICD-10-CM

## 2018-04-01 DIAGNOSIS — Z85820 Personal history of malignant melanoma of skin: Secondary | ICD-10-CM | POA: Insufficient documentation

## 2018-04-01 LAB — COMPREHENSIVE METABOLIC PANEL
ALT: 18 U/L (ref 17–63)
AST: 52 U/L — AB (ref 15–41)
Albumin: 3.5 g/dL (ref 3.5–5.0)
Alkaline Phosphatase: 128 U/L — ABNORMAL HIGH (ref 38–126)
Anion gap: 9 (ref 5–15)
BUN: 35 mg/dL — AB (ref 6–20)
CHLORIDE: 103 mmol/L (ref 101–111)
CO2: 24 mmol/L (ref 22–32)
Calcium: 8.7 mg/dL — ABNORMAL LOW (ref 8.9–10.3)
Creatinine, Ser: 1.5 mg/dL — ABNORMAL HIGH (ref 0.61–1.24)
GFR calc Af Amer: 50 mL/min — ABNORMAL LOW (ref >60)
GFR calc non Af Amer: 43 mL/min — ABNORMAL LOW (ref >60)
Glucose, Bld: 117 mg/dL — ABNORMAL HIGH (ref 65–99)
POTASSIUM: 4.4 mmol/L (ref 3.5–5.1)
SODIUM: 136 mmol/L (ref 135–145)
Total Bilirubin: 0.9 mg/dL (ref 0.3–1.2)
Total Protein: 7.4 g/dL (ref 6.5–8.1)

## 2018-04-01 LAB — POCT INR: INR: 3.6 — AB (ref 2.0–3.0)

## 2018-04-01 LAB — CBC WITH DIFFERENTIAL/PLATELET
Abs Immature Granulocytes: 0 10*3/uL (ref 0.0–0.1)
BASOS ABS: 0 10*3/uL (ref 0.0–0.1)
Basophils Relative: 1 %
EOS ABS: 0.2 10*3/uL (ref 0.0–0.7)
Eosinophils Relative: 3 %
HEMATOCRIT: 31.2 % — AB (ref 39.0–52.0)
Hemoglobin: 9.2 g/dL — ABNORMAL LOW (ref 13.0–17.0)
IMMATURE GRANULOCYTES: 0 %
LYMPHS ABS: 0.8 10*3/uL (ref 0.7–4.0)
Lymphocytes Relative: 14 %
MCH: 23.9 pg — ABNORMAL LOW (ref 26.0–34.0)
MCHC: 29.5 g/dL — ABNORMAL LOW (ref 30.0–36.0)
MCV: 81 fL (ref 78.0–100.0)
Monocytes Absolute: 0.9 10*3/uL (ref 0.1–1.0)
Monocytes Relative: 17 %
NEUTROS PCT: 65 %
Neutro Abs: 3.6 10*3/uL (ref 1.7–7.7)
Platelets: 243 10*3/uL (ref 150–400)
RBC: 3.85 MIL/uL — AB (ref 4.22–5.81)
RDW: 20.8 % — AB (ref 11.5–15.5)
WBC: 5.5 10*3/uL (ref 4.0–10.5)

## 2018-04-01 LAB — I-STAT CG4 LACTIC ACID, ED: Lactic Acid, Venous: 1.38 mmol/L (ref 0.5–1.9)

## 2018-04-01 LAB — TROPONIN I: Troponin I: 0.03 ng/mL (ref <0.03)

## 2018-04-01 MED ORDER — FENTANYL CITRATE (PF) 100 MCG/2ML IJ SOLN
INTRAMUSCULAR | Status: AC
  Filled 2018-04-01: qty 2

## 2018-04-01 MED ORDER — MIDAZOLAM HCL 2 MG/2ML IJ SOLN
INTRAMUSCULAR | Status: AC
  Filled 2018-04-01: qty 2

## 2018-04-01 MED ORDER — KETAMINE HCL 10 MG/ML IJ SOLN
1.0000 mg/kg | Freq: Once | INTRAMUSCULAR | Status: AC
  Administered 2018-04-01: 82 mg via INTRAVENOUS

## 2018-04-01 MED ORDER — KETAMINE HCL 10 MG/ML IJ SOLN
INTRAMUSCULAR | Status: AC
  Filled 2018-04-01: qty 1

## 2018-04-01 NOTE — Telephone Encounter (Signed)
Patient wife called and stated that pt ICD alert sounded this morning. She believes its due to the battery reaching ERI. I instructed pt wife to send a manual remote transmission w/ pt home monitor. Once the transmission is received we will call her back with the results. Pt wife verbalized understanding.

## 2018-04-01 NOTE — Progress Notes (Signed)
[name] changing her Lasix to 3 times a week      Patient Care Team: Bensimhon, Shaune Pascal, MD as PCP - General (Cardiology) Bensimhon, Shaune Pascal, MD as Consulting Physician (Cardiology) Gaye Pollack, MD as Consulting Physician (Cardiothoracic Surgery)   HPI  [name] is a 77 y.o. male Seen in followup for ventricular tachycardia occurring in the context of ischemic heart disease prior revascularization MI. He has had significant problems with ICD discharge and underwent catheter ablation November 2012. He underwent device revision with insertion of an atrial lead and generator replacement 7/14  He underwent repeat ablation August 2014 He was rendered noninducible at this procedure also. He has had recurrent ventricular tachycardia.   Most recently 10/18 hopsitalized for CHF acute chronic and was found to have VT below the detection rate--he was cardioverted and the amio uptitrated    Catheterization was recently demonstrated two-vessel coronary disease with patent stents in the chronically totally occluded RCA ejection fraction of 25-30% Myoview scan demonstrated a large inferolateral infarct.  Echocardiogram 8/14 demonstrated EF of 15-20%     Date TSH LFTs Hgb PFTs  4/19  6.07 14  9.4  *          Called not feeling well.  E LONG RN with remote interrogation demonstrated ventricular tachycardia.  Brought into the office for paced termination.  Mild weakness no palpitations      Past Medical History:  Diagnosis Date  . Arthritis   . Automatic implantable cardioverter-defibrillator in situ    a. s/p Medtronic Evera device serial [id number]    . Bradycardia   . CAD (coronary artery disease)    a. s/p MI 1982;  s/p DES to CFX 2011;  s/p NSTEMI 4/12 in setting of VTach;  cath 7/12:  LM ok, LAD mid 30-40%, Dx's with 40%, mCFX stent patent, prox to mid RCA occluded with R to R and L to R collats.  Medical Tx was continued  . Chronic systolic heart failure ([place])    a.  s/p LVAD 10/2013 for destination therapy  . CKD (chronic kidney disease)   . CVD (cerebrovascular disease)    a. s/p L CEA 2008  . Cytopenia   . Dysrhythmia    AFIB  . History of GI bleed    a. on ASA- started on plavix during 10/2015 admission for CVA  . HLD (hyperlipidemia)   . HTN (hypertension)   . Hypothyroidism   . Inappropriate therapy from implantable cardioverter-defibrillator    a. ATP for sinus tach SVT wavelet identified SVT but was no  passive (2014)  . Ischemic cardiomyopathy   . Melanoma of ear ([place])    a. L Ear   . Myocardial infarction ([place])    1992  . PAF (paroxysmal atrial fibrillation) ([place])   . PVD (peripheral vascular disease) ([place])    a. 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 Shortness of breath dyspnea    W/ EXERTION   . Sleep apnea    NO CPAP NOW  HE SENT IT BACK YRS AGO  . Stroke [facility])    a. 10/2015 admission   . Ventricular tachycardia ([place])    a. s/p AICD;   h/o ICD shock;  Amiodarone Rx. S/P ablation in 2012    Past Surgical History:  Procedure Laterality Date  . BACK SURGERY    .  CARDIAC CATHETERIZATION     "several" (05/25/2013)  . CARDIAC DEFIBRILLATOR PLACEMENT  2003; 2005; 05/25/2013   2014: Medtronic Evera device serial number [id number]  . CARDIAC ELECTROPHYSIOLOGY STUDY AND ABLATION  2012  . CARDIOVERSION N/A 10/15/2013   Procedure: CARDIOVERSION;  Surgeon: Jolaine Artist, MD;  Location: [place];  Service: Cardiovascular;  Laterality: N/A;  . CARDIOVERSION N/A 11/04/2013   Procedure: CARDIOVERSION;  Surgeon: Larey Dresser, MD;  Location: [place];  Service: Cardiovascular;  Laterality: N/A;  . CARDIOVERSION N/A 06/04/2014   Procedure: CARDIOVERSION;  Surgeon: Jolaine Artist, MD;  Location: [facility] ENDOSCOPY;  Service: Cardiovascular;  Laterality: N/A;  . CARDIOVERSION N/A 03/02/2015   Procedure: CARDIOVERSION;  Surgeon: Jolaine Artist, MD;  Location: [facility] ENDOSCOPY;  Service: Cardiovascular;  Laterality: N/A;  . CARDIOVERSION N/A 01/19/2016   Procedure: CARDIOVERSION;  Surgeon: Larey Dresser, MD;  Location: [place];  Service: Cardiovascular;  Laterality: N/A;  . CAROTID ENDARTERECTOMY Left ~ 2007  . CATARACT EXTRACTION W/ INTRAOCULAR LENS  IMPLANT, BILATERAL Bilateral 2000  . CHOLECYSTECTOMY  12/15/?2010  . COLONOSCOPY WITH PROPOFOL N/A 02-Jan-202018   Procedure: COLONOSCOPY WITH PROPOFOL;  Surgeon: Ronald Lobo, MD;  Location: [place];  Service: Endoscopy;  Laterality: N/A;  . CORONARY ANGIOPLASTY  1992  . CORONARY ANGIOPLASTY WITH STENT PLACEMENT     "last one was 11/2012  (05/25/2013)  . CYSTOSCOPY WITH RETROGRADE PYELOGRAM, URETEROSCOPY AND STENT PLACEMENT Left 08/09/2014   Procedure: CYSTOSCOPY WITH RETROGRADE PYELOGRAM, URETEROSCOPY AND STENT PLACEMENT;  Surgeon: Bernestine Amass, MD;  Location: [facility];  Service: Urology;  Laterality: Left;  . CYSTOSCOPY WITH RETROGRADE PYELOGRAM, URETEROSCOPY AND STENT PLACEMENT Left 09/01/2014   Procedure: CYSTOSCOPY WITH URETEROSCOPY AND STENT REMOVAL;  Surgeon: Bernestine Amass, MD;  Location: [facility];  Service: Urology;  Laterality: Left;  . CYSTOSCOPY WITH RETROGRADE PYELOGRAM, URETEROSCOPY AND STENT PLACEMENT Left 09/20/2014   Procedure: CYSTOSCOPY WITH RETROGRADE PYELOGRAM, URETEROSCOPY AND STENT PLACEMENT, stone removal;  Surgeon: Bernestine Amass, MD;  Location: [facility];  Service: Urology;  Laterality: Left;  . CYSTOSCOPY WITH RETROGRADE PYELOGRAM, URETEROSCOPY AND STENT PLACEMENT Bilateral 12/04/2017   Procedure: CYSTOSCOPY WITH BILATERAL RETROGRADE PYELOGRAM, LEFT URETEROSCOPY AND STENT PLACEMENT;  Surgeon: Lucas Mallow, MD;  Location: [place];  Service: Urology;  Laterality: Bilateral;  . DOPPLER ECHOCARDIOGRAPHY  2011  . ELBOW ARTHROSCOPY Right 1990's  . ELECTROPHYSIOLOGIC STUDY N/A 07/19/2016   Procedure: Cardioversion;  Surgeon: Evans Lance, MD;  Location: [place] CV LAB;  Service: Cardiovascular;  Laterality: N/A;  . ENDARTERECTOMY Right 01/13/2016   Procedure: RIGHT CAROTID ARTERY ENDARTERECTOMY;  Surgeon: Mal Misty, MD;  Location: [place];  Service: Vascular;  Laterality: Right;  . ENTEROSCOPY Left 02/23/2017   Procedure: ENTEROSCOPY;  Surgeon: Ronald Lobo, MD;  Location: [facility] ENDOSCOPY;  Service: Endoscopy;  Laterality: Left;  . ENTEROSCOPY N/A 10/17/2017   Procedure: ENTEROSCOPY;  Surgeon: Yetta Flock, MD;  Location: [facility] ENDOSCOPY;  Service: Gastroenterology;  Laterality: N/A;  LVAD pt.    . ESOPHAGOGASTRODUODENOSCOPY N/A 02/15/2015   Procedure: ESOPHAGOGASTRODUODENOSCOPY (EGD);  Surgeon: Carol Ada, MD;  Location: [facility] ENDOSCOPY;  Service: Endoscopy;  Laterality: N/A;  LVAD  . EYE SURGERY     VITRECTOMY  LEFT 05/2012  . FEMORAL-POPLITEAL BYPASS GRAFT Right 2011  . FEMORAL-POPLITEAL BYPASS GRAFT Left 05/2002   Archie Endo 05/06/2002 (05/25/2013)  . HEEL SPUR SURGERY Right 1990's  . HOLMIUM LASER APPLICATION Left 73/71/0626   Procedure: HOLMIUM LASER APPLICATION;  Surgeon: Camelia Eng  Risa Grill, MD;  Location: [facility];  Service: Urology;  Laterality: Left;  . HOLMIUM LASER APPLICATION Left 3/78/5885   Procedure: HOLMIUM LASER APPLICATION;  Surgeon: Lucas Mallow, MD;  Location: [place];  Service: Urology;  Laterality: Left;  . IMPLANTABLE CARDIOVERTER DEFIBRILLATOR GENERATOR CHANGE N/A 05/25/2013   Procedure: IMPLANTABLE CARDIOVERTER DEFIBRILLATOR GENERATOR CHANGE;  Surgeon: Deboraha Sprang, MD;  Location: [facility] CATH LAB;  Service: Cardiovascular;  Laterality: N/A;  . INSERTION OF IMPLANTABLE LEFT VENTRICULAR ASSIST DEVICE N/A 10/19/2013   Procedure: INSERTION OF IMPLANTABLE LEFT VENTRICULAR ASSIST DEVICE;  Surgeon: Gaye Pollack, MD;  Location: [place];  Service: Open Heart Surgery;  Laterality: N/A;  CIRC ARREST  NITRIC OXIDE  MEDTRONIC ICD  . INTRAOPERATIVE TRANSESOPHAGEAL ECHOCARDIOGRAM N/A 10/19/2013   Procedure: INTRAOPERATIVE  TRANSESOPHAGEAL ECHOCARDIOGRAM;  Surgeon: Gaye Pollack, MD;  Location: [facility] OR;  Service: Open Heart Surgery;  Laterality: N/A;  . IR FLUORO GUIDE CV LINE RIGHT  12/10/2017  . IR US GUIDE VASC ACCESS RIGHT  12/10/2017  . KNEE ARTHROSCOPY Bilateral 1990's  . LEAD REVISION N/A 06/05/2013   Procedure: LEAD REVISION;  Surgeon: Thompson Grayer, MD;  Location: [facility] CATH LAB;  Service: Cardiovascular;  Laterality: N/A;  . LEFT HEART CATHETERIZATION WITH CORONARY ANGIOGRAM N/A 11/24/2012   Procedure: LEFT HEART CATHETERIZATION WITH CORONARY ANGIOGRAM;  Surgeon: Peter M Martinique, MD;  Location: [facility] CATH LAB;  Service: Cardiovascular;  Laterality: N/A;  . LIPOMA EXCISION Left 2013   "near ear" (05/25/2013)  . [place]; 2000's  . PATCH ANGIOPLASTY Right 01/13/2016   Procedure: WITH  PATCH ANGIOPLASTY;  Surgeon: Mal Misty, MD;  Location: [place];  Service: Vascular;  Laterality: Right;  . RENAL ARTERY BYPASS Bilateral 2003  . REVASCULARIZATION / IN-SITU GRAFT LEG    . RIGHT HEART CATH N/A 11/29/2017   Procedure: RIGHT HEART CATH;  Surgeon: Jolaine Artist, MD;  Location: [place] CV LAB;  Service: Cardiovascular;  Laterality: N/A;  . RIGHT HEART CATHETERIZATION N/A 09/17/2013   Procedure: RIGHT HEART CATH;  Surgeon: Jolaine Artist, MD;  Location: [facility] CATH LAB;  Service: Cardiovascular;  Laterality: N/A;  . RIGHT HEART CATHETERIZATION N/A 10/14/2013   Procedure: RIGHT HEART CATH;  Surgeon: Jolaine Artist, MD;  Location: [facility] CATH LAB;  Service: Cardiovascular;  Laterality: N/A;  . RIGHT HEART CATHETERIZATION N/A 11/16/2013   Procedure: RIGHT HEART CATH;  Surgeon: Jolaine Artist, MD;  Location: [facility] CATH LAB;  Service: Cardiovascular;  Laterality: N/A;  . RIGHT HEART CATHETERIZATION N/A 07/13/2014   Procedure: RIGHT HEART CATH;  Surgeon: Jolaine Artist, MD;  Location: [facility] CATH LAB;  Service: Cardiovascular;  Laterality: N/A;  . TEE WITHOUT CARDIOVERSION N/A 11/04/2013   Procedure:  TRANSESOPHAGEAL ECHOCARDIOGRAM (TEE);  Surgeon: Larey Dresser, MD;  Location: [place];  Service: Cardiovascular;  Laterality: N/A;  . TEE WITHOUT CARDIOVERSION N/A 03/02/2015   Procedure: TRANSESOPHAGEAL ECHOCARDIOGRAM (TEE);  Surgeon: Jolaine Artist, MD;  Location: [facility] ENDOSCOPY;  Service: Cardiovascular;  Laterality: N/A;  . TEE WITHOUT CARDIOVERSION N/A 01/19/2016   Procedure: TRANSESOPHAGEAL ECHOCARDIOGRAM (TEE);  Surgeon: Larey Dresser, MD;  Location: [place];  Service: Cardiovascular;  Laterality: N/A;  . TONSILLECTOMY  1946  . TRANSURETHRAL RESECTION OF BLADDER TUMOR N/A 12/04/2017   Procedure: TRANSURETHRAL RESECTION OF BLADDER TUMOR (TURBT);  Surgeon: Lucas Mallow, MD;  Location: [place];  Service: Urology;  Laterality: N/A;  . TUMOR EXCISION Left 1960's   "fatty tumor" (05/25/2013)  .  V-TACH ABLATION N/A 09/12/2011   Procedure: V-TACH ABLATION;  Surgeon: Evans Lance, MD;  Location: [facility] CATH LAB;  Service: Cardiovascular;  Laterality: N/A;  . V-TACH ABLATION N/A 06/08/2013   Procedure: V-TACH ABLATION;  Surgeon: Evans Lance, MD;  Location: [facility] CATH LAB;  Service: Cardiovascular;  Laterality: N/A;    Current Outpatient Medications  Medication Sig Dispense Refill  . amiodarone (PACERONE) 200 MG tablet Take 200 mg by mouth daily.    [name] atorvastatin (LIPITOR) 80 MG tablet TAKE 1 TABLET DAILY AT 6 PM (Patient taking differently: TAKE 1 TABLET (80 mg) DAILY in the morning) 30 tablet 11  . docusate sodium (COLACE) 100 MG capsule Take 200 mg by mouth every morning.     [name] doxycycline (VIBRAMYCIN) 50 MG capsule Take 2 capsules (100 mg total) by mouth 2 (two) times daily. 28 capsule 0  . furosemide (LASIX) 20 MG tablet Take 20 mg by mouth daily as needed for fluid.    [name] levothyroxine (SYNTHROID, LEVOTHROID) 150 MCG tablet Take 1 tablet (150 mcg total) by mouth daily before breakfast. 90 tablet 3  . Multiple Vitamins-Minerals (ICAPS PO) Take 1 tablet by mouth 2 (two) times  daily.    . pantoprazole (PROTONIX) 40 MG tablet Take 1 tablet (40 mg total) by mouth 2 (two) times daily. 180 tablet 6  . ranolazine (RANEXA) 500 MG 12 hr tablet Take 1 tablet (500 mg total) by mouth 2 (two) times daily. 60 tablet 11  . sildenafil (REVATIO) 20 MG tablet Take 1 tablet (20 mg total) by mouth 3 (three) times daily. 90 tablet 3  . temazepam (RESTORIL) 30 MG capsule Take 1 capsule (30 mg total) by mouth at bedtime as needed for sleep. 30 capsule 3  . warfarin (COUMADIN) 2 MG tablet Take 2 mg by mouth daily.     [name] zinc gluconate 50 MG tablet Take 50 mg by mouth 2 (two) times daily.      No current facility-administered medications for this visit.     Allergies  Allergen Reactions  . Demerol [Meperidine] Other (See Comments)    Paralysis. Could only move eyes.     Review of Systems negative except from HPI and PMH  Physical Exam Pulse (!) 121   Ht 6' (1.829 m)   Wt 180 lb (81.6 kg)   SpO2 98%   BMI 24.41 kg/m  Well developed and nourished in no acute distress HENT normal Neck supple with JVP-flat Clear Regular rate and rhythm, VT hum No Clubbing cyanosis edema Skin-warm and dry A & Oriented  Grossly normal sensory and motor function   ECG  VT at 115 bpm  Assessment and  Plan  CHF -chronic systolic   ICD-Medtronic The patient's device was interrogated and the information was fully reviewed.  The device was reprogrammed As Below   LVAD  ISCHEMIC CARDIOMYOPATHY  TIA/CVA  VT  AFib  Loss of R waves-ICD  Treated hypothyroidism  Sinus node dysfunction    Recurrent ventricular tachycardia.  Failed to pace terminate despite multiple efforts and cycle length down to 220 ms.  Will transfer to the ER for cardioversion.  Have discussed with Dr. Reine Just

## 2018-04-01 NOTE — ED Provider Notes (Addendum)
[facility] EMERGENCY DEPARTMENT Provider Note   CSN: [id number] Arrival date & time: 04/01/18  1747     History   Chief Complaint Chief Complaint  Patient presents with  . LVAD Vtach    HPI [name] is a 77 y.o. male.  Patient is a 77 year old gentleman with a history of an LVAD as well as a defibrillator, coronary artery disease, chronic kidney disease, currently on Coumadin therapy last INR is 3.6 which was today presenting today with V. tach.  Patient states that almost 8:00 this morning his defibrillator made a noise and he thought the battery was low.  He spoke with cardiology and they made him an appointment for for this afternoon.  When patient arrived he has been in V. tach all day.  Patient denies any chest pain, new shortness of breath or lower extremity edema.  He has been seeing his cardiologist Dr. Haroldine Laws who had placed him on doxycycline last week for possible bronchitis but he is also been using Lasix as well.  Patient did give fluid a few days ago in clinic because his urine was looking dark.  And Dr. Olin Pia office they attempted to pace him out of V. tach without success.  Patient was sent here for further evaluation.  He denies any fever, darkening stools, weakness.  The history is provided by the patient.    Past Medical History:  Diagnosis Date  . Arthritis   . Automatic implantable cardioverter-defibrillator in situ    a. s/p Medtronic Evera device serial [id number]    . Bradycardia   . CAD (coronary artery disease)    a. s/p MI 1982;  s/p DES to CFX 2011;  s/p NSTEMI 4/12 in setting of VTach;  cath 7/12:  LM ok, LAD mid 30-40%, Dx's with 40%, mCFX stent patent, prox to mid RCA occluded with R to R and L to R collats.  Medical Tx was continued  . Chronic systolic heart failure ([place])    a. s/p LVAD 10/2013 for destination therapy  . CKD (chronic kidney disease)   . CVD (cerebrovascular disease)    a. s/p L CEA 2008  . Cytopenia    . Dysrhythmia    AFIB  . History of GI bleed    a. on ASA- started on plavix during 10/2015 admission for CVA  . HLD (hyperlipidemia)   . HTN (hypertension)   . Hypothyroidism   . Inappropriate therapy from implantable cardioverter-defibrillator    a. ATP for sinus tach SVT wavelet identified SVT but was no  passive (2014)  . Ischemic cardiomyopathy   . 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 L Ear   . Myocardial infarction ([place])    1992  . PAF (paroxysmal atrial fibrillation) ([place])   . PVD (peripheral vascular disease) ([place])    a. 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 Shortness of breath dyspnea    W/ EXERTION   . Sleep apnea    NO CPAP NOW  HE SENT IT BACK YRS AGO  . Stroke [facility])    a. 10/2015 admission   . Ventricular tachycardia ([place])    a. s/p AICD;   h/o ICD shock;  Amiodarone Rx. S/P ablation in 2012    Patient Active Problem List   Diagnosis Date Noted  . Acute on chronic systolic heart  failure ([place]) 11/29/2017  . Right ureteral stone   . Melena   . Left ventricular assist device (LVAD) complication, initial encounter 03/26/2017  . Symptomatic anemia 02/20/2017  . Acute bronchitis 07/19/2016  . Open wound of lumbar region without complication 75/64/3329  . Chronic diastolic heart failure ([place])   . Spinal compression fracture ([place]) 02/03/2016  . Subtherapeutic international normalized ratio (INR) 02/01/2016  . Carotid stenosis, asymptomatic 01/31/2016  . Carotid stenosis, symptomatic w/o infarct 01/31/2016  . Carotid artery calcification 01/11/2016  . Symptomatic stenosis of right carotid artery 01/09/2016  . History of GI bleed   . PAF (paroxysmal atrial fibrillation) ([place])   . CKD (chronic kidney disease)   . Hypothyroidism   . CVA (cerebral infarction)   . Cardiomyopathy, ischemic   . Carotid stenosis   . History of CEA (carotid endarterectomy)   . Chronic anticoagulation    . Left ventricular assist device (LVAD) complication 51/88/4166  . Jerking movements of extremities 07/14/2015  . Fall 06/14/2015  . Cough 06/01/2015  . Nausea & vomiting 02/28/2015  . Near syncope   . Chronic systolic CHF (congestive heart failure) ([place])   . Atrial tachycardia, paroxysmal ([place])   . Back pain at L4-L5 level 02/20/2015  . Compression fracture of L4 lumbar vertebra 02/20/2015  . Acute GI bleeding 02/12/2015  . Dyspnea 09/13/2014  . Nephrolithiasis 08/09/2014  . 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 CKD (chronic kidney disease) stage 3, GFR 30-59 ml/min (HCC) 03/26/2014  . RVF (right ventricular failure) ([place]) 11/01/2013  . VT (ventricular tachycardia) ([place]) 10/22/2013  . Presence of left ventricular assist device (LVAD) ([place]) 10/20/2013  . Acute on chronic systolic (congestive) heart failure ([place]) 10/19/2013  . Atrial flutter ([place]) 10/15/2013  . Syncope 10/12/2013  . Weakness generalized 09/23/2013  . Other malaise and fatigue 09/23/2013  . Palliative care encounter 09/23/2013  . Fever 09/20/2013  . Sepsis(995.91) 09/20/2013  . Atrial tachycardia ([place]) 08/27/2013  . Chronic systolic heart failure ([place]) 08/27/2013  . Sinus bradycardia 05/11/2013  . Inappropriate therapy from implantable cardioverter-defibrillator 04/02/2013  . History of AAA (abdominal aortic aneurysm) repair 11/25/2012  . CAD (coronary artery disease) 04/30/2012  . Occlusion and stenosis of carotid artery without mention of cerebral infarction 03/25/2012  . PVD (peripheral vascular disease) ([place])   . HTN (hypertension)   . HLD (hyperlipidemia)   . Ischemic cardiomyopathy   . Automatic implantable cardioverter-defibrillator in situ 04/09/2011  . VENTRICULAR TACHYCARDIA 09/07/2009  . RENAL INSUFFICIENCY 09/07/2009    Past Surgical History:  Procedure Laterality Date  . BACK SURGERY    . CARDIAC CATHETERIZATION     "several" (05/25/2013)  . CARDIAC DEFIBRILLATOR PLACEMENT  2003; 2005;  05/25/2013   2014: Medtronic Evera device serial number [id number]  . CARDIAC ELECTROPHYSIOLOGY STUDY AND ABLATION  2012  . CARDIOVERSION N/A 10/15/2013   Procedure: CARDIOVERSION;  Surgeon: Jolaine Artist, MD;  Location: [place];  Service: Cardiovascular;  Laterality: N/A;  . CARDIOVERSION N/A 11/04/2013   Procedure: CARDIOVERSION;  Surgeon: Larey Dresser, MD;  Location: [place];  Service: Cardiovascular;  Laterality: N/A;  . CARDIOVERSION N/A 06/04/2014   Procedure: CARDIOVERSION;  Surgeon: Jolaine Artist, MD;  Location: [facility] ENDOSCOPY;  Service: Cardiovascular;  Laterality: N/A;  . CARDIOVERSION N/A 03/02/2015   Procedure: CARDIOVERSION;  Surgeon: Jolaine Artist, MD;  Location: [facility] ENDOSCOPY;  Service: Cardiovascular;  Laterality: N/A;  . CARDIOVERSION N/A 01/19/2016   Procedure: CARDIOVERSION;  Surgeon: Larey Dresser, MD;  Location: [facility] ENDOSCOPY;  Service: Cardiovascular;  Laterality: N/A;  . CAROTID ENDARTERECTOMY Left ~ 2007  . CATARACT EXTRACTION W/ INTRAOCULAR LENS  IMPLANT, BILATERAL Bilateral 2000  . CHOLECYSTECTOMY  12/15/?2010  . COLONOSCOPY WITH PROPOFOL N/A 10-Jun-202018   Procedure: COLONOSCOPY WITH PROPOFOL;  Surgeon: Ronald Lobo, MD;  Location: [place];  Service: Endoscopy;  Laterality: N/A;  . CORONARY ANGIOPLASTY  1992  . CORONARY ANGIOPLASTY WITH STENT PLACEMENT     "last one was 11/2012  (05/25/2013)  . CYSTOSCOPY WITH RETROGRADE PYELOGRAM, URETEROSCOPY AND STENT PLACEMENT Left 08/09/2014   Procedure: CYSTOSCOPY WITH RETROGRADE PYELOGRAM, URETEROSCOPY AND STENT PLACEMENT;  Surgeon: Bernestine Amass, MD;  Location: [facility];  Service: Urology;  Laterality: Left;  . CYSTOSCOPY WITH RETROGRADE PYELOGRAM, URETEROSCOPY AND STENT PLACEMENT Left 09/01/2014   Procedure: CYSTOSCOPY WITH URETEROSCOPY AND STENT REMOVAL;  Surgeon: Bernestine Amass, MD;  Location: [facility];  Service: Urology;  Laterality: Left;  . CYSTOSCOPY WITH RETROGRADE PYELOGRAM, URETEROSCOPY AND STENT  PLACEMENT Left 09/20/2014   Procedure: CYSTOSCOPY WITH RETROGRADE PYELOGRAM, URETEROSCOPY AND STENT PLACEMENT, stone removal;  Surgeon: Bernestine Amass, MD;  Location: [facility];  Service: Urology;  Laterality: Left;  . CYSTOSCOPY WITH RETROGRADE PYELOGRAM, URETEROSCOPY AND STENT PLACEMENT Bilateral 12/04/2017   Procedure: CYSTOSCOPY WITH BILATERAL RETROGRADE PYELOGRAM, LEFT URETEROSCOPY AND STENT PLACEMENT;  Surgeon: Lucas Mallow, MD;  Location: [place];  Service: Urology;  Laterality: Bilateral;  . DOPPLER ECHOCARDIOGRAPHY  2011  . ELBOW ARTHROSCOPY Right 1990's  . ELECTROPHYSIOLOGIC STUDY N/A 07/19/2016   Procedure: Cardioversion;  Surgeon: Evans Lance, MD;  Location: [place] CV LAB;  Service: Cardiovascular;  Laterality: N/A;  . ENDARTERECTOMY Right 01/13/2016   Procedure: RIGHT CAROTID ARTERY ENDARTERECTOMY;  Surgeon: Mal Misty, MD;  Location: [place];  Service: Vascular;  Laterality: Right;  . ENTEROSCOPY Left 02/23/2017   Procedure: ENTEROSCOPY;  Surgeon: Ronald Lobo, MD;  Location: [facility] ENDOSCOPY;  Service: Endoscopy;  Laterality: Left;  . ENTEROSCOPY N/A 10/17/2017   Procedure: ENTEROSCOPY;  Surgeon: Yetta Flock, MD;  Location: [facility] ENDOSCOPY;  Service: Gastroenterology;  Laterality: N/A;  LVAD pt.    . ESOPHAGOGASTRODUODENOSCOPY N/A 02/15/2015   Procedure: ESOPHAGOGASTRODUODENOSCOPY (EGD);  Surgeon: Carol Ada, MD;  Location: [facility] ENDOSCOPY;  Service: Endoscopy;  Laterality: N/A;  LVAD  . EYE SURGERY     VITRECTOMY  LEFT 05/2012  . FEMORAL-POPLITEAL BYPASS GRAFT Right 2011  . FEMORAL-POPLITEAL BYPASS GRAFT Left 05/2002   Archie Endo 05/06/2002 (05/25/2013)  . HEEL SPUR SURGERY Right 1990's  . HOLMIUM LASER APPLICATION Left 61/44/3154   Procedure: HOLMIUM LASER APPLICATION;  Surgeon: Bernestine Amass, MD;  Location: [facility];  Service: Urology;  Laterality: Left;  . HOLMIUM LASER APPLICATION Left 0/06/6760   Procedure: HOLMIUM LASER APPLICATION;  Surgeon: Lucas Mallow, MD;   Location: [place];  Service: Urology;  Laterality: Left;  . IMPLANTABLE CARDIOVERTER DEFIBRILLATOR GENERATOR CHANGE N/A 05/25/2013   Procedure: IMPLANTABLE CARDIOVERTER DEFIBRILLATOR GENERATOR CHANGE;  Surgeon: Deboraha Sprang, MD;  Location: [facility] CATH LAB;  Service: Cardiovascular;  Laterality: N/A;  . INSERTION OF IMPLANTABLE LEFT VENTRICULAR ASSIST DEVICE N/A 10/19/2013   Procedure: INSERTION OF IMPLANTABLE LEFT VENTRICULAR ASSIST DEVICE;  Surgeon: Gaye Pollack, MD;  Location: [place];  Service: Open Heart Surgery;  Laterality: N/A;  CIRC ARREST  NITRIC OXIDE  MEDTRONIC ICD  . INTRAOPERATIVE TRANSESOPHAGEAL ECHOCARDIOGRAM N/A 10/19/2013   Procedure: INTRAOPERATIVE TRANSESOPHAGEAL ECHOCARDIOGRAM;  Surgeon: Gaye Pollack, MD;  Location: [facility] OR;  Service: Open Heart  Surgery;  Laterality: N/A;  . IR FLUORO GUIDE CV LINE RIGHT  12/10/2017  . IR US GUIDE VASC ACCESS RIGHT  12/10/2017  . KNEE ARTHROSCOPY Bilateral 1990's  . LEAD REVISION N/A 06/05/2013   Procedure: LEAD REVISION;  Surgeon: Thompson Grayer, MD;  Location: [facility] CATH LAB;  Service: Cardiovascular;  Laterality: N/A;  . LEFT HEART CATHETERIZATION WITH CORONARY ANGIOGRAM N/A 11/24/2012   Procedure: LEFT HEART CATHETERIZATION WITH CORONARY ANGIOGRAM;  Surgeon: Peter M Martinique, MD;  Location: [facility] CATH LAB;  Service: Cardiovascular;  Laterality: N/A;  . LIPOMA EXCISION Left 2013   "near ear" (05/25/2013)  . [place]; 2000's  . PATCH ANGIOPLASTY Right 01/13/2016   Procedure: WITH  PATCH ANGIOPLASTY;  Surgeon: Mal Misty, MD;  Location: [place];  Service: Vascular;  Laterality: Right;  . RENAL ARTERY BYPASS Bilateral 2003  . REVASCULARIZATION / IN-SITU GRAFT LEG    . RIGHT HEART CATH N/A 11/29/2017   Procedure: RIGHT HEART CATH;  Surgeon: Jolaine Artist, MD;  Location: [place] CV LAB;  Service: Cardiovascular;  Laterality: N/A;  . RIGHT HEART CATHETERIZATION N/A 09/17/2013   Procedure: RIGHT HEART CATH;  Surgeon: Jolaine Artist, MD;  Location: [facility] CATH LAB;  Service: Cardiovascular;  Laterality: N/A;  . RIGHT HEART CATHETERIZATION N/A 10/14/2013   Procedure: RIGHT HEART CATH;  Surgeon: Jolaine Artist, MD;  Location: [facility] CATH LAB;  Service: Cardiovascular;  Laterality: N/A;  . RIGHT HEART CATHETERIZATION N/A 11/16/2013   Procedure: RIGHT HEART CATH;  Surgeon: Jolaine Artist, MD;  Location: [facility] CATH LAB;  Service: Cardiovascular;  Laterality: N/A;  . RIGHT HEART CATHETERIZATION N/A 07/13/2014   Procedure: RIGHT HEART CATH;  Surgeon: Jolaine Artist, MD;  Location: [facility] CATH LAB;  Service: Cardiovascular;  Laterality: N/A;  . TEE WITHOUT CARDIOVERSION N/A 11/04/2013   Procedure: TRANSESOPHAGEAL ECHOCARDIOGRAM (TEE);  Surgeon: Larey Dresser, MD;  Location: [place];  Service: Cardiovascular;  Laterality: N/A;  . TEE WITHOUT CARDIOVERSION N/A 03/02/2015   Procedure: TRANSESOPHAGEAL ECHOCARDIOGRAM (TEE);  Surgeon: Jolaine Artist, MD;  Location: [facility] ENDOSCOPY;  Service: Cardiovascular;  Laterality: N/A;  . TEE WITHOUT CARDIOVERSION N/A 01/19/2016   Procedure: TRANSESOPHAGEAL ECHOCARDIOGRAM (TEE);  Surgeon: Larey Dresser, MD;  Location: [place];  Service: Cardiovascular;  Laterality: N/A;  . TONSILLECTOMY  1946  . TRANSURETHRAL RESECTION OF BLADDER TUMOR N/A 12/04/2017   Procedure: TRANSURETHRAL RESECTION OF BLADDER TUMOR (TURBT);  Surgeon: Lucas Mallow, MD;  Location: [place];  Service: Urology;  Laterality: N/A;  . TUMOR EXCISION Left 1960's   "fatty tumor" (05/25/2013)  . V-TACH ABLATION N/A 09/12/2011   Procedure: V-TACH ABLATION;  Surgeon: Evans Lance, MD;  Location: [facility] CATH LAB;  Service: Cardiovascular;  Laterality: N/A;  . V-TACH ABLATION N/A 06/08/2013   Procedure: V-TACH ABLATION;  Surgeon: Evans Lance, MD;  Location: [facility] CATH LAB;  Service: Cardiovascular;  Laterality: N/A;        Home Medications    Prior to Admission medications   Medication Sig Start Date End Date Taking?  Authorizing Provider  amiodarone (PACERONE) 200 MG tablet Take 200 mg by mouth daily.    [provider]  atorvastatin (LIPITOR) 80 MG tablet TAKE 1 TABLET DAILY AT 6 PM Patient taking differently: TAKE 1 TABLET (80 mg) DAILY in the morning 07/22/17   Bensimhon, Shaune Pascal, MD  docusate sodium (COLACE) 100 MG capsule Take 200 mg by mouth every morning.     [provider]  doxycycline (VIBRAMYCIN) 50 MG capsule Take 2 capsules (100 mg total) by mouth 2 (two) times daily. 03/26/18   Bensimhon, Shaune Pascal, MD  furosemide (LASIX) 20 MG tablet Take 20 mg by mouth daily as needed for fluid.    [provider]  levothyroxine (SYNTHROID, LEVOTHROID) 150 MCG tablet Take 1 tablet (150 mcg total) by mouth daily before breakfast. 10/25/17   Bensimhon, Shaune Pascal, MD  Multiple Vitamins-Minerals (ICAPS PO) Take 1 tablet by mouth 2 (two) times daily.    [provider]  pantoprazole (PROTONIX) 40 MG tablet Take 1 tablet (40 mg total) by mouth 2 (two) times daily. 04/09/17   Bensimhon, Shaune Pascal, MD  ranolazine (RANEXA) 500 MG 12 hr tablet Take 1 tablet (500 mg total) by mouth 2 (two) times daily. 01/27/18   Bensimhon, Shaune Pascal, MD  sildenafil (REVATIO) 20 MG tablet Take 1 tablet (20 mg total) by mouth 3 (three) times daily. 12/17/17   Shirley Friar, PA-C  temazepam (RESTORIL) 30 MG capsule Take 1 capsule (30 mg total) by mouth at bedtime as needed for sleep. 02/12/18   Bensimhon, Shaune Pascal, MD  warfarin (COUMADIN) 2 MG tablet Take 2 mg by mouth daily.     [provider]  zinc gluconate 50 MG tablet Take 50 mg by mouth 2 (two) times daily.     [provider]    Family History Family History  Problem Relation Age of Onset  . Heart attack Mother   . Coronary artery disease Mother   . Cancer Mother   . Heart disease Mother   . Hyperlipidemia Mother   . Hypertension Mother   . Coronary artery disease Father   . Stroke Father   . Cancer Father   . Heart  disease Father        before age 27  . Hyperlipidemia Father   . Hypertension Father   . Heart attack Father   . Coronary artery disease Unknown     Social History Social History   Tobacco Use  . Smoking status: Former Smoker    Packs/day: 0.50    Years: 37.00    Pack years: 18.50    Types: Cigarettes    Last attempt to quit: 11/05/2001    Years since quitting: 16.4  . Smokeless tobacco: Never Used  Substance Use Topics  . Alcohol use: Yes    Alcohol/week: 0.0 oz    Comment: 05/25/2013 "mixed drink couple times/month"  . Drug use: No     Allergies   Demerol [meperidine]   Review of Systems Review of Systems  All other systems reviewed and are negative.    Physical Exam Updated Vital Signs BP 103/87   Pulse 63   Temp 98.2 F (36.8 C) (Oral)   Resp 17   SpO2 98%   Physical Exam  Constitutional: He is oriented to person, place, and time. He appears well-developed and well-nourished. No distress.  HENT:  Head: Normocephalic and atraumatic.  Mouth/Throat: Oropharynx is clear and moist.  Eyes: Pupils are equal, round, and reactive to light. Conjunctivae and EOM are normal.  Neck: Normal range of motion. Neck supple.  Cardiovascular:  Hum heard from LVAD  Pulmonary/Chest: Effort normal. No respiratory distress. He has no wheezes. He has rales.  Rales throughout  Abdominal: Soft. He exhibits no distension. There is no tenderness. There is no rebound and no guarding.  LVAD catheter site clean dry and intact  Musculoskeletal: Normal range of motion. He  exhibits no edema or tenderness.  Neurological: He is alert and oriented to person, place, and time.  Skin: Skin is warm and dry. No rash noted. No erythema.  Psychiatric: He has a normal mood and affect. His behavior is normal.  Nursing note and vitals reviewed.    ED Treatments / Results  Labs (all labs ordered are listed, but only abnormal results are displayed) Labs Reviewed  CBC WITH DIFFERENTIAL/PLATELET  - Abnormal; Notable for the following components:      Result Value   RBC 3.85 (*)    Hemoglobin 9.2 (*)    HCT 31.2 (*)    MCH 23.9 (*)    MCHC 29.5 (*)    RDW 20.8 (*)    All other components within normal limits  COMPREHENSIVE METABOLIC PANEL - Abnormal; Notable for the following components:   Glucose, Bld 117 (*)    BUN 35 (*)    Creatinine, Ser 1.50 (*)    Calcium 8.7 (*)    AST 52 (*)    Alkaline Phosphatase 128 (*)    GFR calc non Af [name] 43 (*)    GFR calc Af [name] 50 (*)    All other components within normal limits  I-STAT TROPONIN, ED  I-STAT CG4 LACTIC ACID, ED    EKG EKG Interpretation  Date/Time:  Tuesday Apr 01 2018 18:21:19 EDT Ventricular Rate:  135 PR Interval:    QRS Duration: 228 QT Interval:  339 QTC Calculation: 562 R Axis:   -140 Text Interpretation:  Sinus tachycardia Ventricular tachycardia, unsustained Nonspecific intraventricular conduction delay Lead(s) V3 V4 V5 V6 were not used for morphology analysis Artifact in lead(s) I II aVR V1 V2 Ventricular tachycardia (ventricular or supraventricular with aberration) RESOLVED SINCE PREVIOUS Confirmed by Blanchie Dessert [phone]) on 04/01/2018 7:43:56 PM   Radiology Dg Chest Port 1 View  Result Date: 04/01/2018 CLINICAL DATA:  Ventricular tachycardia. EXAM: PORTABLE CHEST 1 VIEW COMPARISON:  02/10/2018 FINDINGS: Stable cardiac enlargement. Partially visualized LVAD appears stable. Stable appearance of ICD. Pacing pads present. There is no evidence of pulmonary edema, consolidation, pneumothorax or pleural fluid. IMPRESSION: Stable cardiac enlargement and appearance of LVAD and ICD. No overt pulmonary edema. Electronically Signed   By: Aletta Edouard M.D.   On: 04/01/2018 18:42    Procedures .Sedation Date/Time: 04/01/2018 7:26 PM Performed by: Blanchie Dessert, MD Authorized by: Blanchie Dessert, MD   Consent:    Consent obtained:  Verbal   Consent given by:  Patient   Risks discussed:  Inadequate  sedation, nausea and vomiting   Alternatives discussed:  Analgesia without sedation Universal protocol:    Procedure explained and questions answered to patient or proxy's satisfaction: yes     Relevant documents present and verified: yes     Immediately prior to procedure a time out was called: yes     Patient identity confirmation method:  Verbally with patient Indications:    Procedure performed:  Cardioversion   Procedure necessitating sedation performed by:  Physician performing sedation   Intended level of sedation:  Moderate (conscious sedation) Pre-sedation assessment:    Time since last food or drink:  Noon   ASA classification: class 4 - patient with severe systemic disease that is a constant threat to life     Neck mobility: normal     Mouth opening:  3 or more finger widths   Thyromental distance:  3 finger widths   Mallampati score:  II - soft palate, uvula, fauces visible   Pre-sedation  assessments completed and reviewed: airway patency, cardiovascular function, hydration status, mental status, nausea/vomiting, pain level, respiratory function and temperature     Pre-sedation assessment completed:  04/01/2018 6:33 PM Immediate pre-procedure details:    Reassessment: Patient reassessed immediately prior to procedure     Reviewed: vital signs, relevant labs/tests and NPO status     Verified: bag valve mask available, emergency equipment available, intubation equipment available, IV patency confirmed, oxygen available and suction available   Procedure details (see MAR for exact dosages):    Preoxygenation:  Nasal cannula   Sedation:  Ketamine   Intra-procedure monitoring:  Blood pressure monitoring, continuous capnometry, continuous pulse oximetry, cardiac monitor, frequent LOC assessments and frequent vital sign checks   Intra-procedure events: none     Total Provider sedation time (minutes):  15 Post-procedure details:    Post-sedation assessment completed:  04/01/2018 7:34  PM Comments:     Patient is back to baseline with no complaints.   (including critical care time)  Medications Ordered in ED Medications  ketamine (KETALAR) injection 82 mg (82 mg Intravenous Given 04/01/18 1815)     Initial Impression / Assessment and Plan / ED Course  I have reviewed the triage vital signs and the nursing notes.  Pertinent labs & imaging results that were available during my care of the patient were reviewed by me and considered in my medical decision making (see chart for details).     Patient presenting with V. tach who is an LVAD patient.  Patient is currently asymptomatic.  EKG confirms that V. tach.  Patient's map is in the 27s.  Cardiology is at bedside and wishes to cardiovert the patient.  Patient was sedated with ketamine and cardioverted by cardiology with 200 J with return to sinus rhythm.  Patient tolerated the procedure well.  He is now awake and alert.  Patient is denying any worsening shortness of breath but has been dealing with increased fluid lately.  He is going to continue taking his Lasix.  Patient is also on doxycycline at this time.  Patient will increase his amiodarone to 200 mg twice daily for the next few weeks and cardiology will follow up with him tomorrow.  Final Clinical Impressions(s) / ED Diagnoses   Final diagnoses:  Ventricular tachycardia [facility])    ED Discharge Orders    None       Blanchie Dessert, MD 04/01/18 [id number]    Blanchie Dessert, MD 04/01/18 [id number]

## 2018-04-01 NOTE — Telephone Encounter (Signed)
Transmission received. Therapies exhausted in VT zone. Pt presently in VT ~ 105bpm. He has an LVAD, on amio, ranexa, warfarin. Pt feels poorly x 2 weeks due to viral cold per wife. I will discuss with Dr. Caryl Comes once he arrives in the office and call back with recommendations.

## 2018-04-01 NOTE — ED Triage Notes (Signed)
Pt arrived EMS from his cardiologist office where he was discovered to be in VT after his defibrillator alarmed this morning. Pt has LVAD, and was asymptomatic. He has been cardioverted in the past for similar events. Per EMS when his Drs office interrogated the device he was seen to have been in VT since 5/24 they attempted to correct it with pacing PTA without success. 20GLAC P160s VT BP86/68 with EMS

## 2018-04-01 NOTE — CV Procedure (Signed)
    DIRECT CURRENT CARDIOVERSION  NAME:  [name]   MRN: [id number] DOB:  30-May-1941   ADMIT DATE: 04/01/2018   INDICATIONS: Ventricular Tachycarida   PROCEDURE:   Informed consent was obtained prior to the procedure. The risks, benefits and alternatives for the procedure were discussed and the patient comprehended these risks. Once an appropriate time out was taken, the patient had the defibrillator pads placed in the anterior and posterior position. The patient then underwent sedation by Dr. Maryan Rued. Once an appropriate level of sedation was achieved, the patient received a single biphasic, synchronized 200J shock with prompt conversion to sinus rhythm. No apparent complications.  Glori Bickers, MD  6:39 PM

## 2018-04-01 NOTE — Telephone Encounter (Signed)
Reviewed with Dr. Caryl Comes- he would like the patient to come in to the office to pace-terminate VT. Pt lives in [place]- probably won't be here until close to 4pm. Wife agreeable to bring [name].

## 2018-04-01 NOTE — Consult Note (Signed)
Advanced Heart Failure VAD ER Consult  Note   PCP-Cardiologist: Curtis Uriarte  Reason for Visit: Ventricular Tachycardia  Consulting: Dr. Maryan Rued  HPI:    [name] is a 78 yo male with h/o VT, PAD and severe systolic HF (EF 54-62%) due to mixed ischemic/nonischemic CM he is s/p HM II LVAD implant for destination therapy on October 19, 2013.VAD implantation was complicated by VT, syncope, AF/Aflutter and RHF. Required short term milrinone.   VT events 9/17 with high fevers and productive cough. Treated with levaquin x 7 days for acute bronchitis. Also found to have VT and underwent DC-CV. Seen by EP and amio increased 10/17 speed turned down to 8800 due to RV failure. 10/18 with slow VT and ADHF.Cardioverted in ED and amio uptitrated.   Recently seen in [facility] with volume overload and URI. Lasix increased and started on doxy. Has been improving slowly. This am noticed his ICD was beeping and thought it was low battery alert. Called Dr. Olin Pia office and remote device interrogation showed VT int the 150-160 range. Was brought to clinic and underwent attempt at pace termination which was unsuccessful. Transferred to ER. On arrival felt fine. No CP, lightheadedness or dyspnea.   ECG showed monomorphic VT at 165 bpm  MAPs 60-70   LVAD INTERROGATION:  HeartMate II LVAD:  Flow  2.7 liters/min, speed 8800, power 3.9, PI 3.3     Review of Systems: [y] = yes, [ ]  = no   General: Weight gain [ ] ; Weight loss [ ] ; Anorexia [ ] ; Fatigue Blue.Reese ]; Fever [ ] ; Chills [ ] ; Weakness [ ]   Cardiac: Chest pain/pressure [ ] ; Resting SOB [ ] ; Exertional SOB Blue.Reese ]; Orthopnea [ ] ; Pedal Edema [ ] ; Palpitations [ ] ; Syncope [ ] ; Presyncope [ ] ; Paroxysmal nocturnal dyspnea[ ]   Pulmonary: Cough Blue.Reese ]; Wheezing[ ] ; Hemoptysis[ ] ; Sputum [ ] ; Snoring [ ]   GI: Vomiting[ ] ; Dysphagia[ ] ; Melena[ ] ; Hematochezia [ ] ; Heartburn[ ] ; Abdominal pain [ ] ; Constipation [ ] ; Diarrhea [ ] ; BRBPR [ ]   GU:  Hematuria[ ] ; Dysuria [ ] ; Nocturia[ ]   Vascular: Pain in legs with walking [ ] ; Pain in feet with lying flat [ ] ; Non-healing sores [ ] ; Stroke [ ] ; TIA [ ] ; Slurred speech [ ] ;  Neuro: Headaches[ ] ; Vertigo[ ] ; Seizures[ ] ; Paresthesias[ ] ;Blurred vision [ ] ; Diplopia [ ] ; Vision changes [ ]   Ortho/Skin: Arthritis Blue.Reese ]; Joint pain [ y]; Muscle pain [ ] ; Joint swelling [ ] ; Back Pain [ ] ; Rash [ ]   Psych: Depression[ ] ; Anxiety[ ]   Heme: Bleeding problems Blue.Reese ]; Clotting disorders [ ] ; Anemia Blue.Reese ]  Endocrine: Diabetes [ ] ; Thyroid dysfunction[ ]     Home Medications Prior to Admission medications   Medication Sig Start Date End Date Taking? Authorizing Provider  amiodarone (PACERONE) 200 MG tablet Take 200 mg by mouth daily.    [provider]  atorvastatin (LIPITOR) 80 MG tablet TAKE 1 TABLET DAILY AT 6 PM Patient taking differently: TAKE 1 TABLET (80 mg) DAILY in the morning 07/22/17   Paelyn Smick, Shaune Pascal, MD  docusate sodium (COLACE) 100 MG capsule Take 200 mg by mouth every morning.     [provider]  doxycycline (VIBRAMYCIN) 50 MG capsule Take 2 capsules (100 mg total) by mouth 2 (two) times daily. 03/26/18   Aaradhya Kysar, Shaune Pascal, MD  furosemide (LASIX) 20 MG tablet Take 20 mg by mouth daily as needed for fluid.    [provider]  levothyroxine (SYNTHROID, LEVOTHROID) 150 MCG tablet Take 1 tablet (150 mcg total) by mouth daily before breakfast. 10/25/17   Sayward Horvath, Shaune Pascal, MD  Multiple Vitamins-Minerals (ICAPS PO) Take 1 tablet by mouth 2 (two) times daily.    [provider]  pantoprazole (PROTONIX) 40 MG tablet Take 1 tablet (40 mg total) by mouth 2 (two) times daily. 04/09/17   Harout Scheurich, Shaune Pascal, MD  ranolazine (RANEXA) 500 MG 12 hr tablet Take 1 tablet (500 mg total) by mouth 2 (two) times daily. 01/27/18   Felisha Claytor, Shaune Pascal, MD  sildenafil (REVATIO) 20 MG tablet Take 1 tablet (20 mg total) by mouth 3 (three) times daily. 12/17/17   Shirley Friar, PA-C  temazepam (RESTORIL) 30 MG capsule Take 1 capsule (30 mg total) by mouth at bedtime as needed for sleep. 02/12/18   Delynn Olvera, Shaune Pascal, MD  warfarin (COUMADIN) 2 MG tablet Take 2 mg by mouth daily.     [provider]  zinc gluconate 50 MG tablet Take 50 mg by mouth 2 (two) times daily.     [provider]    Past Medical History: Past Medical History:  Diagnosis Date  . Arthritis   . Automatic implantable cardioverter-defibrillator in situ    a. s/p Medtronic Evera device serial [id number]    . Bradycardia   . CAD (coronary artery disease)    a. s/p MI 1982;  s/p DES to CFX 2011;  s/p NSTEMI 4/12 in setting of VTach;  cath 7/12:  LM ok, LAD mid 30-40%, Dx's with 40%, mCFX stent patent, prox to mid RCA occluded with R to R and L to R collats.  Medical Tx was continued  . Chronic systolic heart failure ([place])    a. s/p LVAD 10/2013 for destination therapy  . CKD (chronic kidney disease)   . CVD (cerebrovascular disease)    a. s/p L CEA 2008  . Cytopenia   . Dysrhythmia    AFIB  . History of GI bleed    a. on ASA- started on plavix during 10/2015 admission for CVA  . HLD (hyperlipidemia)   . HTN (hypertension)   . Hypothyroidism   . Inappropriate therapy from implantable cardioverter-defibrillator    a. ATP for sinus tach SVT wavelet identified SVT but was no  passive (2014)  . Ischemic cardiomyopathy   . 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 L Ear   . Myocardial infarction ([place])    1992  . PAF (paroxysmal atrial fibrillation) ([place])   . PVD (peripheral vascular disease) ([place])    a. 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 Shortness of breath dyspnea    W/ EXERTION   . Sleep apnea    NO CPAP NOW  HE SENT IT BACK YRS AGO  . Stroke [facility])    a. 10/2015 admission   . Ventricular tachycardia ([place])    a. s/p AICD;   h/o ICD shock;  Amiodarone Rx. S/P ablation  in 2012    Past Surgical History: Past Surgical History:  Procedure Laterality Date  . BACK SURGERY    . CARDIAC CATHETERIZATION     "several" (05/25/2013)  . CARDIAC DEFIBRILLATOR PLACEMENT  2003; 2005; 05/25/2013   2014: Medtronic Evera device serial number [id number]  . CARDIAC ELECTROPHYSIOLOGY STUDY AND ABLATION  2012  . CARDIOVERSION  N/A 10/15/2013   Procedure: CARDIOVERSION;  Surgeon: Jolaine Artist, MD;  Location: [place];  Service: Cardiovascular;  Laterality: N/A;  . CARDIOVERSION N/A 11/04/2013   Procedure: CARDIOVERSION;  Surgeon: Larey Dresser, MD;  Location: [place];  Service: Cardiovascular;  Laterality: N/A;  . CARDIOVERSION N/A 06/04/2014   Procedure: CARDIOVERSION;  Surgeon: Jolaine Artist, MD;  Location: [facility] ENDOSCOPY;  Service: Cardiovascular;  Laterality: N/A;  . CARDIOVERSION N/A 03/02/2015   Procedure: CARDIOVERSION;  Surgeon: Jolaine Artist, MD;  Location: [facility] ENDOSCOPY;  Service: Cardiovascular;  Laterality: N/A;  . CARDIOVERSION N/A 01/19/2016   Procedure: CARDIOVERSION;  Surgeon: Larey Dresser, MD;  Location: [place];  Service: Cardiovascular;  Laterality: N/A;  . CAROTID ENDARTERECTOMY Left ~ 2007  . CATARACT EXTRACTION W/ INTRAOCULAR LENS  IMPLANT, BILATERAL Bilateral 2000  . CHOLECYSTECTOMY  12/15/?2010  . COLONOSCOPY WITH PROPOFOL N/A 08/25/2017   Procedure: COLONOSCOPY WITH PROPOFOL;  Surgeon: Ronald Lobo, MD;  Location: [place];  Service: Endoscopy;  Laterality: N/A;  . CORONARY ANGIOPLASTY  1992  . CORONARY ANGIOPLASTY WITH STENT PLACEMENT     "last one was 11/2012  (05/25/2013)  . CYSTOSCOPY WITH RETROGRADE PYELOGRAM, URETEROSCOPY AND STENT PLACEMENT Left 08/09/2014   Procedure: CYSTOSCOPY WITH RETROGRADE PYELOGRAM, URETEROSCOPY AND STENT PLACEMENT;  Surgeon: Bernestine Amass, MD;  Location: [facility];  Service: Urology;  Laterality: Left;  . CYSTOSCOPY WITH RETROGRADE PYELOGRAM, URETEROSCOPY AND STENT PLACEMENT Left 09/01/2014    Procedure: CYSTOSCOPY WITH URETEROSCOPY AND STENT REMOVAL;  Surgeon: Bernestine Amass, MD;  Location: [facility];  Service: Urology;  Laterality: Left;  . CYSTOSCOPY WITH RETROGRADE PYELOGRAM, URETEROSCOPY AND STENT PLACEMENT Left 09/20/2014   Procedure: CYSTOSCOPY WITH RETROGRADE PYELOGRAM, URETEROSCOPY AND STENT PLACEMENT, stone removal;  Surgeon: Bernestine Amass, MD;  Location[facility];  Service: Urology;  Laterality: Left;  . CYSTOSCOPY WITH RETROGRADE PYELOGRAM, URETEROSCOPY AND STENT PLACEMENT Bilateral 12/04/2017   Procedure: CYSTOSCOPY WITH BILATERAL RETROGRADE PYELOGRAM, LEFT URETEROSCOPY AND STENT PLACEMENT;  Surgeon: Lucas Mallow, MD;  Location: [place];  Service: Urology;  Laterality: Bilateral;  . DOPPLER ECHOCARDIOGRAPHY  2011  . ELBOW ARTHROSCOPY Right 1990's  . ELECTROPHYSIOLOGIC STUDY N/A 07/19/2016   Procedure: Cardioversion;  Surgeon: Evans Lance, MD;  Location: [place] CV LAB;  Service: Cardiovascular;  Laterality: N/A;  . ENDARTERECTOMY Right 01/13/2016   Procedure: RIGHT CAROTID ARTERY ENDARTERECTOMY;  Surgeon: Mal Misty, MD;  Location: [place];  Service: Vascular;  Laterality: Right;  . ENTEROSCOPY Left 02/23/2017   Procedure: ENTEROSCOPY;  Surgeon: Ronald Lobo, MD;  Location: [facility] ENDOSCOPY;  Service: Endoscopy;  Laterality: Left;  . ENTEROSCOPY N/A 10/17/2017   Procedure: ENTEROSCOPY;  Surgeon: Yetta Flock, MD;  Location: [facility] ENDOSCOPY;  Service: Gastroenterology;  Laterality: N/A;  LVAD pt.    . ESOPHAGOGASTRODUODENOSCOPY N/A 02/15/2015   Procedure: ESOPHAGOGASTRODUODENOSCOPY (EGD);  Surgeon: Carol Ada, MD;  Location: [facility] ENDOSCOPY;  Service: Endoscopy;  Laterality: N/A;  LVAD  . EYE SURGERY     VITRECTOMY  LEFT 05/2012  . FEMORAL-POPLITEAL BYPASS GRAFT Right 2011  . FEMORAL-POPLITEAL BYPASS GRAFT Left 05/2002   Archie Endo 05/06/2002 (05/25/2013)  . HEEL SPUR SURGERY Right 1990's  . HOLMIUM LASER APPLICATION Left 04/30/9484   Procedure: HOLMIUM LASER APPLICATION;   Surgeon: Bernestine Amass, MD;  [facility];  Service: Urology;  Laterality: Left;  . HOLMIUM LASER APPLICATION Left 4/62/7035   Procedure: HOLMIUM LASER APPLICATION;  Surgeon: Lucas Mallow, MD;  Location: [place];  Service:  Urology;  Laterality: Left;  . IMPLANTABLE CARDIOVERTER DEFIBRILLATOR GENERATOR CHANGE N/A 05/25/2013   Procedure: IMPLANTABLE CARDIOVERTER DEFIBRILLATOR GENERATOR CHANGE;  Surgeon: Deboraha Sprang, MD;  Location: [facility] CATH LAB;  Service: Cardiovascular;  Laterality: N/A;  . INSERTION OF IMPLANTABLE LEFT VENTRICULAR ASSIST DEVICE N/A 10/19/2013   Procedure: INSERTION OF IMPLANTABLE LEFT VENTRICULAR ASSIST DEVICE;  Surgeon: Gaye Pollack, MD;  Location: [place];  Service: Open Heart Surgery;  Laterality: N/A;  CIRC ARREST  NITRIC OXIDE  MEDTRONIC ICD  . INTRAOPERATIVE TRANSESOPHAGEAL ECHOCARDIOGRAM N/A 10/19/2013   Procedure: INTRAOPERATIVE TRANSESOPHAGEAL ECHOCARDIOGRAM;  Surgeon: Gaye Pollack, MD;  Location: [facility] OR;  Service: Open Heart Surgery;  Laterality: N/A;  . IR FLUORO GUIDE CV LINE RIGHT  12/10/2017  . IR US GUIDE VASC ACCESS RIGHT  12/10/2017  . KNEE ARTHROSCOPY Bilateral 1990's  . LEAD REVISION N/A 06/05/2013   Procedure: LEAD REVISION;  Surgeon: Thompson Grayer, MD;  Location: [facility] CATH LAB;  Service: Cardiovascular;  Laterality: N/A;  . LEFT HEART CATHETERIZATION WITH CORONARY ANGIOGRAM N/A 11/24/2012   Procedure: LEFT HEART CATHETERIZATION WITH CORONARY ANGIOGRAM;  Surgeon: Peter M Martinique, MD;  Location: [facility] CATH LAB;  Service: Cardiovascular;  Laterality: N/A;  . LIPOMA EXCISION Left 2013   "near ear" (05/25/2013)  . [place]; 2000's  . PATCH ANGIOPLASTY Right 01/13/2016   Procedure: WITH  PATCH ANGIOPLASTY;  Surgeon: Mal Misty, MD;  Location: [place];  Service: Vascular;  Laterality: Right;  . RENAL ARTERY BYPASS Bilateral 2003  . REVASCULARIZATION / IN-SITU GRAFT LEG    . RIGHT HEART CATH N/A 11/29/2017   Procedure: RIGHT HEART CATH;  Surgeon:  Jolaine Artist, MD;  Location: [place] CV LAB;  Service: Cardiovascular;  Laterality: N/A;  . RIGHT HEART CATHETERIZATION N/A 09/17/2013   Procedure: RIGHT HEART CATH;  Surgeon: Jolaine Artist, MD;  Location: [facility] CATH LAB;  Service: Cardiovascular;  Laterality: N/A;  . RIGHT HEART CATHETERIZATION N/A 10/14/2013   Procedure: RIGHT HEART CATH;  Surgeon: Jolaine Artist, MD;  Location: [facility] CATH LAB;  Service: Cardiovascular;  Laterality: N/A;  . RIGHT HEART CATHETERIZATION N/A 11/16/2013   Procedure: RIGHT HEART CATH;  Surgeon: Jolaine Artist, MD;  Location: [facility] CATH LAB;  Service: Cardiovascular;  Laterality: N/A;  . RIGHT HEART CATHETERIZATION N/A 07/13/2014   Procedure: RIGHT HEART CATH;  Surgeon: Jolaine Artist, MD;  Location: [facility] CATH LAB;  Service: Cardiovascular;  Laterality: N/A;  . TEE WITHOUT CARDIOVERSION N/A 11/04/2013   Procedure: TRANSESOPHAGEAL ECHOCARDIOGRAM (TEE);  Surgeon: Larey Dresser, MD;  Location: [place];  Service: Cardiovascular;  Laterality: N/A;  . TEE WITHOUT CARDIOVERSION N/A 03/02/2015   Procedure: TRANSESOPHAGEAL ECHOCARDIOGRAM (TEE);  Surgeon: Jolaine Artist, MD;  Location: [facility] ENDOSCOPY;  Service: Cardiovascular;  Laterality: N/A;  . TEE WITHOUT CARDIOVERSION N/A 01/19/2016   Procedure: TRANSESOPHAGEAL ECHOCARDIOGRAM (TEE);  Surgeon: Larey Dresser, MD;  Location: [place];  Service: Cardiovascular;  Laterality: N/A;  . TONSILLECTOMY  1946  . TRANSURETHRAL RESECTION OF BLADDER TUMOR N/A 12/04/2017   Procedure: TRANSURETHRAL RESECTION OF BLADDER TUMOR (TURBT);  Surgeon: Lucas Mallow, MD;  Location: [place];  Service: Urology;  Laterality: N/A;  . TUMOR EXCISION Left 1960's   "fatty tumor" (05/25/2013)  . V-TACH ABLATION N/A 09/12/2011   Procedure: V-TACH ABLATION;  Surgeon: Evans Lance, MD;  Location: [facility] CATH LAB;  Service: Cardiovascular;  Laterality: N/A;  . V-TACH ABLATION N/A 06/08/2013   Procedure: V-TACH ABLATION;  Surgeon:  Evans Lance, MD;  Location: [facility] CATH LAB;  Service: Cardiovascular;  Laterality: N/A;    Family History: Family History  Problem Relation Age of Onset  . Heart attack Mother   . Coronary artery disease Mother   . Cancer Mother   . Heart disease Mother   . Hyperlipidemia Mother   . Hypertension Mother   . Coronary artery disease Father   . Stroke Father   . Cancer Father   . Heart disease Father        before age 59  . Hyperlipidemia Father   . Hypertension Father   . Heart attack Father   . Coronary artery disease Unknown     Social History: Social History   Socioeconomic History  . Marital status: Divorced    Spouse name: Not on file  . Number of children: 2  . Years of education: Not on file  . Highest education level: Not on file  Occupational History  . Occupation: retired    Comment: Health visitor  . Occupation: Retired    Comment: Security [employer]  . Occupation: Part-time    Comment: Security at [employer]  . Financial resource strain: Not on file  . Food insecurity:    Worry: Not on file    Inability: Not on file  . Transportation needs:    Medical: Not on file    Non-medical: Not on file  Tobacco Use  . Smoking status: Former Smoker    Packs/day: 0.50    Years: 37.00    Pack years: 18.50    Types: Cigarettes    Last attempt to quit: 11/05/2001    Years since quitting: 16.4  . Smokeless tobacco: Never Used  Substance and Sexual Activity  . Alcohol use: Yes    Alcohol/week: 0.0 oz    Comment: 05/25/2013 "mixed drink couple times/month"  . Drug use: No  . Sexual activity: Never  Lifestyle  . Physical activity:    Days per week: Not on file    Minutes per session: Not on file  . Stress: Not on file  Relationships  . Social connections:    Talks on phone: Not on file    Gets together: Not on file    Attends religious service: Not on file    Active member of club or organization: Not on file    Attends meetings of clubs or  organizations: Not on file    Relationship status: Not on file  Other Topics Concern  . Not on file  Social History Narrative   Lives with sig other.    Allergies:  Allergies  Allergen Reactions  . Demerol [Meperidine] Other (See Comments)    Paralysis. Could only move eyes.     Objective:    Vital Signs:   Pulse Rate:  [45-121] 45 (05/28 1802) Resp:  [17-29] 21 (05/28 1815) BP: (69-112)/(54-81) 112/81 (05/28 1815) SpO2:  [98 %] 98 % (05/28 1620) Weight:  [81.6 kg (180 lb)] 81.6 kg (180 lb) (05/28 1620)   There were no vitals filed for this visit.  Mean arterial Pressure 60-70s  Physical Exam    General:  Lying in bed. No resp difficulty HEENT: Normal Neck: supple. JVP 10. Carotids 2+ bilat; no bruits. No lymphadenopathy or thyromegaly appreciated. Cor: Mechanical heart sounds with LVAD hum present. Lungs: Mild rhonchi Abdomen: soft, nontender, nondistended. No hepatosplenomegaly. No bruits or masses. Good bowel sounds. Driveline: C/D/I; securement device intact and driveline incorporated Extremities:  no cyanosis, clubbing, rash, trace edema Neuro: alert & orientedx3, cranial nerves grossly intact. moves all 4 extremities w/o difficulty. Affect pleasant   Telemetry   VT 165 Personally reviewed   EKG   Monomorphic VT 165 Personally reviewed   Labs    Basic Metabolic Panel: Recent Labs  Lab 03/26/18 1142  NA 135  K 3.8  CL 102  CO2 25  GLUCOSE 131*  BUN 26*  CREATININE 1.17  CALCIUM 8.6*    Liver Function Tests: Recent Labs  Lab 03/26/18 1142  AST 25  ALT 15*  ALKPHOS 150*  BILITOT 0.8  PROT 7.6  ALBUMIN 3.5   No results for input(s): LIPASE, AMYLASE in the last 168 hours. No results for input(s): AMMONIA in the last 168 hours.  CBC: Recent Labs  Lab 03/26/18 1142  WBC 6.2  HGB 9.1*  HCT 30.7*  MCV 82.5  PLT 211    Cardiac Enzymes: No results for input(s): CKTOTAL, CKMB, CKMBINDEX, TROPONINI in the last 168  hours.  BNP: BNP (last 3 results) Recent Labs    08/17/17 1900  BNP 627.8*    ProBNP (last 3 results) No results for input(s): PROBNP in the last 8760 hours.   CBG: No results for input(s): GLUCAP in the last 168 hours.  Coagulation Studies: Recent Labs    04/01/18  INR 3.6*      Imaging     No results found.    Patient Profile:   77 y/o male with ischemic CM with severe systolic HF s/p HM-II VAD, CAD, paroxysmal VT and AF bought to ER for recurrent VT  Assessment/Plan:    1. Recurrent VT - failed pace termination - patient underwent DC-CV in ER with 200J and prompt cardioversion to NSR - will monitor for 30 mins and can go home - increase amio to 200 bid - check k and mag  - d/w Dr. Caryl Comes  2. Chronic systolic HF s/p HM-II LVAD - volume status mildly elevated but improving - continue home lasix - VAD interrogated personally. Parameters stable. - INR 3.6 today  3. URI - improving with doxy. Continue   I reviewed the LVAD parameters from today, and compared the results to the patient's prior recorded data.  No programming changes were made.  The LVAD is functioning within specified parameters.  The patient performs LVAD self-test daily.  LVAD interrogation was negative for any significant power changes, alarms or PI events/speed drops.  LVAD equipment check completed and is in good working order.  Back-up equipment present.   LVAD education done on emergency procedures and precautions and reviewed exit site care.  Length of Stay: 0  Glori Bickers, MD 04/01/2018, 6:27 PM  VAD Team Pager [phone] (7am - 7am) +++VAD ISSUES ONLY+++   Advanced Heart Failure Team Pager [phone] (M-F; [place])  Please contact [facility] for night-coverage after hours (4p -7a ) and weekends on [email or web address] for all non- LVAD Issues

## 2018-04-01 NOTE — Discharge Instructions (Addendum)
Increase your amiodarone to 200 mg twice a day.  Take your Lasix as prescribed when you have extra fluid.

## 2018-04-02 ENCOUNTER — Encounter: Payer: Medicare Other | Admitting: Internal Medicine

## 2018-04-02 ENCOUNTER — Ambulatory Visit (INDEPENDENT_AMBULATORY_CARE_PROVIDER_SITE_OTHER): Payer: Medicare Other | Admitting: *Deleted

## 2018-04-02 DIAGNOSIS — I472 Ventricular tachycardia, unspecified: Secondary | ICD-10-CM

## 2018-04-02 LAB — CUP PACEART INCLINIC DEVICE CHECK
Battery Remaining Longevity: 33 mo
Brady Statistic AP VS Percent: 48.14 %
Brady Statistic AS VP Percent: 0.05 %
Brady Statistic AS VS Percent: 51.74 %
Brady Statistic RA Percent Paced: 42.43 %
Date Time Interrogation Session: 20190528205904
HighPow Impedance: 35 Ohm
HighPow Impedance: 47 Ohm
Implantable Lead Implant Date: 20030714
Implantable Lead Model: 5592
Lead Channel Impedance Value: 399 Ohm
Lead Channel Impedance Value: 456 Ohm
Lead Channel Pacing Threshold Amplitude: 1 V
Lead Channel Pacing Threshold Amplitude: 1 V
Lead Channel Pacing Threshold Pulse Width: 0.4 ms
Lead Channel Pacing Threshold Pulse Width: 0.4 ms
Lead Channel Sensing Intrinsic Amplitude: 1.625 mV
Lead Channel Setting Pacing Amplitude: 2 V
Lead Channel Setting Sensing Sensitivity: 0.3 mV
MDC IDC LEAD IMPLANT DT: 20140801
MDC IDC LEAD LOCATION: 753859
MDC IDC LEAD LOCATION: 753860
MDC IDC MSMT BATTERY VOLTAGE: 2.94 V
MDC IDC MSMT LEADCHNL RA SENSING INTR AMPL: 1.625 mV
MDC IDC MSMT LEADCHNL RV IMPEDANCE VALUE: 589 Ohm
MDC IDC MSMT LEADCHNL RV SENSING INTR AMPL: 2 mV
MDC IDC MSMT LEADCHNL RV SENSING INTR AMPL: 2 mV
MDC IDC PG IMPLANT DT: 20140721
MDC IDC SET LEADCHNL RA PACING AMPLITUDE: 2 V
MDC IDC SET LEADCHNL RV PACING PULSEWIDTH: 0.4 ms
MDC IDC STAT BRADY AP VP PERCENT: 0.07 %
MDC IDC STAT BRADY RV PERCENT PACED: 0.12 %

## 2018-04-02 NOTE — Progress Notes (Signed)
Remote ICD transmission.   

## 2018-04-04 ENCOUNTER — Ambulatory Visit (HOSPITAL_COMMUNITY): Payer: Self-pay | Admitting: Pharmacist

## 2018-04-04 LAB — CUP PACEART REMOTE DEVICE CHECK
Battery Voltage: 2.94 V
Brady Statistic AP VP Percent: 0.07 %
Brady Statistic AP VS Percent: 48.26 %
Brady Statistic AS VP Percent: 0.05 %
Brady Statistic AS VS Percent: 51.62 %
Brady Statistic RV Percent Paced: 0.12 %
HighPow Impedance: 35 Ohm
HighPow Impedance: 47 Ohm
Implantable Lead Implant Date: 20140801
Implantable Lead Model: 6944
Implantable Pulse Generator Implant Date: 20140721
Lead Channel Impedance Value: 399 Ohm
Lead Channel Impedance Value: 589 Ohm
Lead Channel Pacing Threshold Amplitude: 1 V
Lead Channel Pacing Threshold Pulse Width: 0.4 ms
Lead Channel Sensing Intrinsic Amplitude: 1.625 mV
Lead Channel Sensing Intrinsic Amplitude: 1.625 mV
Lead Channel Sensing Intrinsic Amplitude: 2 mV
Lead Channel Sensing Intrinsic Amplitude: 2 mV
Lead Channel Setting Pacing Amplitude: 2 V
Lead Channel Setting Pacing Pulse Width: 0.4 ms
MDC IDC LEAD IMPLANT DT: 20030714
MDC IDC LEAD LOCATION: 753859
MDC IDC LEAD LOCATION: 753860
MDC IDC MSMT BATTERY REMAINING LONGEVITY: 33 mo
MDC IDC MSMT LEADCHNL RA PACING THRESHOLD AMPLITUDE: 1 V
MDC IDC MSMT LEADCHNL RA PACING THRESHOLD PULSEWIDTH: 0.4 ms
MDC IDC MSMT LEADCHNL RV IMPEDANCE VALUE: 456 Ohm
MDC IDC SESS DTM: 20190528162900
MDC IDC SET LEADCHNL RV PACING AMPLITUDE: 2 V
MDC IDC SET LEADCHNL RV SENSING SENSITIVITY: 0.3 mV
MDC IDC STAT BRADY RA PERCENT PACED: 42.7 %

## 2018-04-04 LAB — POCT INR: INR: 3 (ref 2.0–3.0)

## 2018-04-08 ENCOUNTER — Ambulatory Visit (HOSPITAL_COMMUNITY): Payer: Self-pay | Admitting: Pharmacist

## 2018-04-08 LAB — POCT INR: INR: 2.8 (ref 2.0–3.0)

## 2018-04-10 ENCOUNTER — Telehealth (HOSPITAL_COMMUNITY): Payer: Self-pay | Admitting: Pharmacist

## 2018-04-10 ENCOUNTER — Telehealth: Payer: Self-pay | Admitting: Internal Medicine

## 2018-04-10 NOTE — Telephone Encounter (Signed)
Spoke with [name] and she stated transmission sent this am because she is concerned something going on with patient. He is shortness of breath but that is nothing new. Spoke with Pamala Hurry C in device clinic and transmission received and they will reach out to patient.

## 2018-04-10 NOTE — Telephone Encounter (Signed)
Remote transmission reviewed. Presenting rhythm: ApVs. (2) VT-NS episodes, both 6bts in duration, last 04/09/18. Normal optivol @ present. Normal device function.   Called [name] to inform her of patient's remote results. [name] verbalized understanding. [name] states that patient has been experiencing bad hematuria (h/o bladder CA), and she was just concerned that patient may be in VT. She states that he never has any sx's, so she just wanted to make sure.   Patient to see urologist later today.

## 2018-04-10 NOTE — Telephone Encounter (Signed)
Hematuria - check up with urology  Last INR  Slightly elevated  - instructed to take 1/2 tab today instead of whole

## 2018-04-14 ENCOUNTER — Other Ambulatory Visit: Payer: Self-pay | Admitting: Urology

## 2018-04-15 ENCOUNTER — Ambulatory Visit (HOSPITAL_COMMUNITY): Payer: Self-pay | Admitting: Pharmacist

## 2018-04-15 LAB — POCT INR: INR: 1.8 — AB (ref 2.0–3.0)

## 2018-04-21 ENCOUNTER — Encounter (HOSPITAL_COMMUNITY): Payer: Self-pay

## 2018-04-21 ENCOUNTER — Other Ambulatory Visit (HOSPITAL_COMMUNITY): Payer: Self-pay | Admitting: Unknown Physician Specialty

## 2018-04-21 ENCOUNTER — Ambulatory Visit (HOSPITAL_COMMUNITY)
Admission: RE | Admit: 2018-04-21 | Discharge: 2018-04-21 | Disposition: A | Discharge status: Home / Self Care | Payer: Medicare Other | Source: Ambulatory Visit | Attending: Cardiology | Admitting: Cardiology

## 2018-04-21 VITALS — BP 100/0 | HR 65 | Ht 72.0 in | Wt 186.4 lb

## 2018-04-21 DIAGNOSIS — I472 Ventricular tachycardia, unspecified: Secondary | ICD-10-CM

## 2018-04-21 DIAGNOSIS — Z7901 Long term (current) use of anticoagulants: Secondary | ICD-10-CM | POA: Diagnosis not present

## 2018-04-21 DIAGNOSIS — G4700 Insomnia, unspecified: Secondary | ICD-10-CM | POA: Insufficient documentation

## 2018-04-21 DIAGNOSIS — I132 Hypertensive heart and chronic kidney disease with heart failure and with stage 5 chronic kidney disease, or end stage renal disease: Secondary | ICD-10-CM | POA: Insufficient documentation

## 2018-04-21 DIAGNOSIS — I5022 Chronic systolic (congestive) heart failure: Secondary | ICD-10-CM | POA: Diagnosis not present

## 2018-04-21 DIAGNOSIS — E039 Hypothyroidism, unspecified: Secondary | ICD-10-CM | POA: Insufficient documentation

## 2018-04-21 DIAGNOSIS — K922 Gastrointestinal hemorrhage, unspecified: Secondary | ICD-10-CM | POA: Diagnosis not present

## 2018-04-21 DIAGNOSIS — Z95811 Presence of heart assist device: Secondary | ICD-10-CM

## 2018-04-21 DIAGNOSIS — I471 Supraventricular tachycardia: Secondary | ICD-10-CM | POA: Diagnosis not present

## 2018-04-21 DIAGNOSIS — N186 End stage renal disease: Secondary | ICD-10-CM | POA: Diagnosis not present

## 2018-04-21 DIAGNOSIS — Z79899 Other long term (current) drug therapy: Secondary | ICD-10-CM | POA: Insufficient documentation

## 2018-04-21 DIAGNOSIS — Z9889 Other specified postprocedural states: Secondary | ICD-10-CM | POA: Insufficient documentation

## 2018-04-21 DIAGNOSIS — D494 Neoplasm of unspecified behavior of bladder: Secondary | ICD-10-CM | POA: Insufficient documentation

## 2018-04-21 DIAGNOSIS — Z79890 Hormone replacement therapy (postmenopausal): Secondary | ICD-10-CM | POA: Diagnosis not present

## 2018-04-21 DIAGNOSIS — Z8673 Personal history of transient ischemic attack (TIA), and cerebral infarction without residual deficits: Secondary | ICD-10-CM | POA: Diagnosis not present

## 2018-04-21 DIAGNOSIS — E785 Hyperlipidemia, unspecified: Secondary | ICD-10-CM | POA: Diagnosis not present

## 2018-04-21 DIAGNOSIS — Z8679 Personal history of other diseases of the circulatory system: Secondary | ICD-10-CM | POA: Insufficient documentation

## 2018-04-21 DIAGNOSIS — M4856XA Collapsed vertebra, not elsewhere classified, lumbar region, initial encounter for fracture: Secondary | ICD-10-CM | POA: Insufficient documentation

## 2018-04-21 LAB — PROTIME-INR
INR: 2.04
Prothrombin Time: 22.9 seconds — ABNORMAL HIGH (ref 11.4–15.2)

## 2018-04-21 LAB — BASIC METABOLIC PANEL
Anion gap: 9 (ref 5–15)
BUN: 31 mg/dL — AB (ref 6–20)
CALCIUM: 9 mg/dL (ref 8.9–10.3)
CHLORIDE: 104 mmol/L (ref 101–111)
CO2: 23 mmol/L (ref 22–32)
CREATININE: 1.26 mg/dL — AB (ref 0.61–1.24)
GFR calc Af Amer: >60 mL/min (ref >60)
GFR calc non Af Amer: 53 mL/min — ABNORMAL LOW (ref >60)
GLUCOSE: 122 mg/dL — AB (ref 65–99)
Potassium: 4.2 mmol/L (ref 3.5–5.1)
Sodium: 136 mmol/L (ref 135–145)

## 2018-04-21 LAB — CBC
HEMATOCRIT: 30.8 % — AB (ref 39.0–52.0)
HEMOGLOBIN: 9.1 g/dL — AB (ref 13.0–17.0)
MCH: 24.4 pg — AB (ref 26.0–34.0)
MCHC: 29.5 g/dL — AB (ref 30.0–36.0)
MCV: 82.6 fL (ref 78.0–100.0)
Platelets: 191 10*3/uL (ref 150–400)
RBC: 3.73 MIL/uL — ABNORMAL LOW (ref 4.22–5.81)
RDW: 22.8 % — AB (ref 11.5–15.5)
WBC: 6.7 10*3/uL (ref 4.0–10.5)

## 2018-04-21 LAB — LACTATE DEHYDROGENASE: LDH: 281 U/L — ABNORMAL HIGH (ref 98–192)

## 2018-04-21 MED ORDER — SILDENAFIL CITRATE 20 MG PO TABS: 20.0000 mg | ORAL_TABLET | Freq: Three times a day (TID) | ORAL | 11 refills | Status: DC

## 2018-04-21 NOTE — Progress Notes (Signed)
Patient presents for 2 week follow up in [facility] today with caregiver, [name].  Reports no problems with VAD equipment or concerns with drive line exit site.   Vital Signs:  Doppler Pressure: 100 - reflecting modified systolic Automatc BP:  540/08 (91) HR: 65 SPO2: 95% RA  Weight: 186.4 lb w/o eqt Last weight: 189.4  lb  VAD Indication: Destination Therapy- age excluding    VAD interrogation & Equipment Management: Speed:8800 Flow:  4.0 Power: 4.5w    PI: 7.5  Alarms: no clinical alarms Events: rare PI event  Fixed speed 8800 Low speed limit: 8200  Primary Controller: Replace back up battery in 29 months Back up controller: Replace back up battery in 57months  Exit Site Care: Drive line is being maintained weekly by [name]. Drive line exit site well healed and incorporated. The velour is fully implanted at exit site. Dressing dry and intact. No erythema or drainage. Stabilization device present and accurately applied. Pt denies fever or chills. Pt given 4 dressing kits today. [name] concerned about a black speck under the dressing of driveline. This does not appear to anything troublesome, we will continue to watch.   Significant Events on VAD Support:  02/2015 >>GIB (removed ASA, 2-2.5 INR) 10/2015 >>CVA(Plavix started) 02/2017> GIB (removed ASA, scopes negative) 03/2017> percutaneous lead repair 12/2017> Bladder CA  Device:Medtronic Therapies: on 200 bpm AT monitor: 140 bpm Last check: 03/03/18  BP & Labs:  MAP 100 - Doppler is reflecting modified systolic  Hgb 9.1- No S/S of bleeding. Specifically denies melena/BRBPR or nosebleeds. No blood in urine.  LDH stable at 281with established baseline of 280-380. Denies tea-colored urine. No power elevations noted on interrogation.    Patient Instructions: 1. No changes in medications. 2. We will make appt after hospital discharge following bladder surgery.   Tanda Rockers RN [place] Coordinator    Office: [phone] 24/7 Emergency VAD Pager: [phone]

## 2018-04-21 NOTE — Progress Notes (Signed)
Patient ID: [name], male   DOB: 04/16/1941, 77 y.o.   MRN: [id number]   VAD CLINIC PROGRESS NOTE  PCP: Dr. Kandice Robinsons ([place]) GU: Dr Gloriann Loan Neuro Surgeon: Dr Vertell Limber Vascular: Dr Kellie Simmering Primary HF: Dr Haroldine Laws  Hamp Moreland is a 77 yo male with h/o VT, PAD and severe systolic HF (EF 37-16%) due to mixed ischemic/nonischemic CM he is s/p HM II LVAD implant for destination therapy on October 19, 2013.VAD implantation was complicated by VT, syncope, AF/Aflutter and RHF. Required short term milrinone.    GU Event 09/01/14 had impacted ureteral stone and underwent cystoscopy and flexible ureteroscopy with lithotripsy and basket extraction.  09/2015 Hematuria and chronic nephrolithiasis. Renal US showed mild hyronephrosis and bilateral renal cysts. Dr. Risa Grill took back for repeat lithotripsy and stone extraction by ureteroscopy and J stent placement. 12/18 Recurrent hematuria. 2mm left ureteral stone. Passed with tamulosin 1/19   Admitted with recurrent hematuria with ureteral stone. Stone removed but found to have large bladder mass which was removed. Course c/b obstructive uropathy and AKI requiring 1 session of HD.  3/19 Bladder chemo stopped after 2 treatments as unable to tolerate  GI Event  02/2015 - Hgb 5.4 --> EGD that showed 2 AVMs in the jejunum that were clipped.  4/2-18 - Episode of melena. Hgb 10.9-> 8.7. EGD/colonoscopy negative 12/18 - Melena with syncope  Hgb 6.5. Transfused 5u. 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  01/2016 R CEA. He was started on Plavix.  01/2016 Lumbar fusion L3-L5 with pedicle screws posterolateral arthrodesis with BMP, allograft  VT events 9/17 with high fevers and productive cough. Treated with levaquin x 7 days for acute bronchitis. Also found to have VT and underwent DC-CV. Seen by EP and amio increased 10/17 speed turned down to 8800 due to RV failure. 10/18 with slow VT and ADHF.Cardioverted in ED and  amio uptitrated.  04/01/18 recurrent VT. Underwent DC-CV in Er  Significant VAD Events  03/2017>percutaneous lead repair   Follow up LVAD Clinic: He returns today with [name] for f/u. Last month was found to have VT on device interrogation. Dr. Caryl Comes attempted to pace him out but was unsuccessful. We then cardioverted him in the ER and he went home. Since that time felling pretty good but having more of his choking episodes. Unsure why. Feels he has more energy. Chronic DOE that is unchanged. No orthopnea or PND. No edema. No CP. Saw Dr. 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 No fevers, chills or problems with driveline. No bleeding, melena or neuro symptoms. Had one brief low flow alarm. Taking all meds as prescribed.    VAD Indication: Destination Therapy- age excluding    VAD interrogation & Equipment Management: Speed:8800 Flow:  3.8 Power: 4.4w PI: 8.0  Alarms: no clinical alarms Events: rare PI event  Fixed speed 8800 Low speed limit: 8200  Primary Controller: Replace back up battery in 30 months Back up controller: Replace back up battery in 59months  :   Exit Site Care: Drive line is being maintained weekly by [name]. Drive line exit site well healed and incorporated. The velour is fully implanted at exit site. Dressing dry and intact. No erythema or drainage. Stabilization device present and accurately applied. Pt denies fever or chills. Pt given 6 weekly dressing kits at this visit.     Past Medical History:  Diagnosis Date  . Arthritis   . Automatic  implantable cardioverter-defibrillator in situ    a. s/p Medtronic Evera device serial [id number]    . Bradycardia   . CAD (coronary artery disease)    a. s/p MI 1982;  s/p DES to CFX 2011;  s/p NSTEMI 4/12 in setting of VTach;  cath 7/12:  LM ok, LAD mid 30-40%, Dx's with 40%, mCFX stent patent, prox to mid RCA occluded with R to R and L to R collats.  Medical Tx was  continued  . Chronic systolic heart failure ([place])    a. s/p LVAD 10/2013 for destination therapy  . CKD (chronic kidney disease)   . CVD (cerebrovascular disease)    a. s/p L CEA 2008  . Cytopenia   . Dysrhythmia    AFIB  . History of GI bleed    a. on ASA- started on plavix during 10/2015 admission for CVA  . HLD (hyperlipidemia)   . HTN (hypertension)   . Hypothyroidism   . Inappropriate therapy from implantable cardioverter-defibrillator    a. ATP for sinus tach SVT wavelet identified SVT but was no  passive (2014)  . Ischemic cardiomyopathy   . 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 L Ear   . Myocardial infarction ([place])    1992  . PAF (paroxysmal atrial fibrillation) ([place])   . PVD (peripheral vascular disease) ([place])    a. 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 Shortness of breath dyspnea    W/ EXERTION   . Sleep apnea    NO CPAP NOW  HE SENT IT BACK YRS AGO  . Stroke [facility])    a. 10/2015 admission   . Ventricular tachycardia ([place])    a. s/p AICD;   h/o ICD shock;  Amiodarone Rx. S/P ablation in 2012    Current Outpatient Medications  Medication Sig Dispense Refill  . amiodarone (PACERONE) 200 MG tablet Take 200 mg by mouth See admin instructions. Take 200 mg daily     . atorvastatin (LIPITOR) 80 MG tablet TAKE 1 TABLET DAILY AT 6 PM (Patient taking differently: Take 80 mg by mouth at bedtime) 30 tablet 11  . docusate sodium (COLACE) 100 MG capsule Take 200 mg by mouth every morning.     . furosemide (LASIX) 20 MG tablet Take 20 mg by mouth daily as needed for fluid.    [name] levothyroxine (SYNTHROID, LEVOTHROID) 150 MCG tablet Take 1 tablet (150 mcg total) by mouth daily before breakfast. 90 tablet 3  . Multiple Vitamins-Minerals (ICAPS PO) Take 1 tablet by mouth 2 (two) times daily.    . pantoprazole (PROTONIX) 40 MG tablet Take 1 tablet (40 mg total) by mouth 2 (two) times daily. 180 tablet  6  . ranolazine (RANEXA) 500 MG 12 hr tablet Take 1 tablet (500 mg total) by mouth 2 (two) times daily. 60 tablet 11  . sildenafil (REVATIO) 20 MG tablet Take 1 tablet (20 mg total) by mouth 3 (three) times daily. 90 tablet 11  . temazepam (RESTORIL) 30 MG capsule Take 1 capsule (30 mg total) by mouth at bedtime as needed for sleep. 30 capsule 3  . warfarin (COUMADIN) 2 MG tablet Take 2 mg by mouth every evening. 1mg  Tue/Sun 2mg  AOD    . zinc gluconate 50 MG tablet Take 50 mg by mouth 2 (two) times daily.     . Chlorpheniramine-DM (  CORICIDIN COUGH/COLD) 4-30 MG TABS Take 1 tablet by mouth every 6 (six) hours as needed (for congestion and coughing).     . Dextromethorphan-guaiFENesin (ROBITUSSIN COUGH+CHEST CONG DM PO) Take 20 mLs by mouth every 6 (six) hours as needed (for congestion and coughing).      No current facility-administered medications for this encounter.     Demerol [meperidine]  REVIEW OF SYSTEMS: All systems negative except as listed in HPI, PMH and Problem list.   Vital Signs:  Doppler Pressure: 110 - reflecting modified systolic Automatc BP:  814/48 (89) HR: 60 SPO2: 94% RA  Weight: 189.4 lb w/o eqt Last weight: 190.6  lb   Physical exam: General:  NAD.  HEENT: normal  Neck: supple. JVP 6.  Carotids 2+ bilat; no bruits. No lymphadenopathy or thryomegaly appreciated. Cor: LVAD hum.  Lungs: Clear. Abdomen:  soft, nontender, non-distended. No hepatosplenomegaly. No bruits or masses. Good bowel sounds. Driveline site clean. Anchor in place.  Extremities: no cyanosis, clubbing, rash. Warm no edema  Neuro: alert & oriented x 3. No focal deficits. Moves all 4 without problem     ASSESSMENT AND PLAN:  1. Chronic systolic HF: s/p HM-II LVAD 10/2014.  - Recent RHC in 1/19 with mildly low output. Started on milrinone without benefit.  - Improved today NYHA II-III - Volume status improved. Encouraged him to take lasix as needed and not to wait - VAD interrogated  personally. Parameters stable. Occasioanl PI events. Low flow event not recorded.  (LVAD speed reduced to 8800 in 10/17 due to RV failure) - s/p previous driveline repair. No further pump stops\ 2.VT  - underwent DC-CV 9/17.  - repeat episode of slow VT 08/17/17. Requiring DC-CV.  - repeat VT 04/01/18 requiring DC-CV. amio increased to 200 bid  - decrease amio 200 daily - Keep K > 4.0 Mg > 2.0 - ICD interrogated today personally. No further VT episodes. Volume status ok. Activity level 1 hour/day or less 3. 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  - Had 2 BCG infusions with Dr. Gloriann Loan but discontinued due to inability to tolerate - No with recurrent tumor. Will work with Urology to arrange admission for resection and warfarin management 4. 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 Required HD x1 - creatinine stable at 1.26 today  5. 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 Continue coumadin and plavix.(with h/o CVA). - Admitted 4/18 with recurrent GIB. Transfused 2u. EGD/colon negative - Admitted 12./18. hgb 6.5, Transfused 5u. EGD with 2 AVMs treated with APC - No further bleeding.  - Hgb stable at 9.1 today - Has been getting octreotide infusions but too expensive for him. Will cotninue to  hold for now  6. Atrial arrhythmias: Atrial fibrillation, atrial tachycardia.  S/p DC-CV 06/04/14, DC-CV 03/02/15 and 01/19/16.  - Remains in NSR.  Continue low-dose amio.  7. HTN:  - Blood pressure well controlled. Continue current regimen. 8. LVAD:  - VAD interrogated personally. Parameters stable. - LDH 281 9. Anticoagulation management:  - Goal INR 2-2.5 INR 2.04 Discussed dosing with PharmD personally. 10. Lumbar compression fracture:  s/p L3-L5 fusion 02/03/16. - back pain resolved 11. 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  Continue atorvastatin. (also  warfarin INR goal 2-2.5). S/p R CEA 01/13/16. Plavix stopped 12/18 due to recurrent GIB 12. H/o amio-induced hypethyroidism:  - Followed  by Dr. Forde Dandy in past.  - Amio restarted due to recurrent atrial arrhythmias and VT.  - Recent FT4 elevated. Synthroid cut to 150 daily in 12/18. Recheck next visit.  13. Insomnia - can use temazepam 30 qhs PRN  Total time spent 35 minutes. Over half that time spent discussing above.   Margorie Renner,MD 6:30 PM

## 2018-04-22 ENCOUNTER — Ambulatory Visit (HOSPITAL_COMMUNITY): Payer: Self-pay | Admitting: Pharmacist

## 2018-04-23 ENCOUNTER — Encounter (HOSPITAL_COMMUNITY): Payer: Medicare Other

## 2018-04-28 ENCOUNTER — Ambulatory Visit (HOSPITAL_COMMUNITY): Payer: Self-pay | Admitting: Pharmacist

## 2018-04-28 LAB — POCT INR: INR: 2.2 (ref 2.0–3.0)

## 2018-05-02 ENCOUNTER — Other Ambulatory Visit (HOSPITAL_COMMUNITY): Payer: Self-pay | Admitting: Internal Medicine

## 2018-05-02 DIAGNOSIS — I5041 Acute combined systolic (congestive) and diastolic (congestive) heart failure: Secondary | ICD-10-CM

## 2018-05-02 DIAGNOSIS — Z95811 Presence of heart assist device: Secondary | ICD-10-CM

## 2018-05-02 DIAGNOSIS — K922 Gastrointestinal hemorrhage, unspecified: Secondary | ICD-10-CM

## 2018-05-05 ENCOUNTER — Ambulatory Visit (HOSPITAL_COMMUNITY): Payer: Self-pay | Admitting: Pharmacist

## 2018-05-05 LAB — POCT INR: INR: 2.7 (ref 2–3)

## 2018-05-12 ENCOUNTER — Ambulatory Visit (HOSPITAL_COMMUNITY): Payer: Self-pay | Admitting: Pharmacist

## 2018-05-12 ENCOUNTER — Inpatient Hospital Stay (HOSPITAL_COMMUNITY): Admission: RE | Admit: 2018-05-12 | Payer: Medicare Other | Source: Ambulatory Visit

## 2018-05-12 LAB — POCT INR: INR: 2.6 (ref 2.0–3.0)

## 2018-05-14 ENCOUNTER — Inpatient Hospital Stay (HOSPITAL_COMMUNITY)
Admission: RE | Admit: 2018-05-14 | Discharge: 2018-05-21 | DRG: 657 | Disposition: A | Discharge status: Home / Self Care | Payer: Medicare Other | Attending: Cardiology | Admitting: Cardiology

## 2018-05-14 ENCOUNTER — Encounter (HOSPITAL_COMMUNITY): Payer: Self-pay | Admitting: *Deleted

## 2018-05-14 ENCOUNTER — Other Ambulatory Visit: Payer: Self-pay

## 2018-05-14 DIAGNOSIS — T83122A Displacement of urinary stent, initial encounter: Secondary | ICD-10-CM

## 2018-05-14 DIAGNOSIS — Z8249 Family history of ischemic heart disease and other diseases of the circulatory system: Secondary | ICD-10-CM

## 2018-05-14 DIAGNOSIS — I959 Hypotension, unspecified: Secondary | ICD-10-CM | POA: Diagnosis not present

## 2018-05-14 DIAGNOSIS — N183 Chronic kidney disease, stage 3 (moderate): Secondary | ICD-10-CM | POA: Diagnosis present

## 2018-05-14 DIAGNOSIS — M1990 Unspecified osteoarthritis, unspecified site: Secondary | ICD-10-CM | POA: Diagnosis present

## 2018-05-14 DIAGNOSIS — D494 Neoplasm of unspecified behavior of bladder: Secondary | ICD-10-CM

## 2018-05-14 DIAGNOSIS — R509 Fever, unspecified: Secondary | ICD-10-CM | POA: Diagnosis not present

## 2018-05-14 DIAGNOSIS — T829XXA Unspecified complication of cardiac and vascular prosthetic device, implant and graft, initial encounter: Secondary | ICD-10-CM | POA: Diagnosis present

## 2018-05-14 DIAGNOSIS — I130 Hypertensive heart and chronic kidney disease with heart failure and stage 1 through stage 4 chronic kidney disease, or unspecified chronic kidney disease: Secondary | ICD-10-CM | POA: Diagnosis present

## 2018-05-14 DIAGNOSIS — E611 Iron deficiency: Secondary | ICD-10-CM | POA: Diagnosis present

## 2018-05-14 DIAGNOSIS — I5022 Chronic systolic (congestive) heart failure: Secondary | ICD-10-CM | POA: Diagnosis present

## 2018-05-14 DIAGNOSIS — R946 Abnormal results of thyroid function studies: Secondary | ICD-10-CM | POA: Diagnosis present

## 2018-05-14 DIAGNOSIS — Z961 Presence of intraocular lens: Secondary | ICD-10-CM | POA: Diagnosis present

## 2018-05-14 DIAGNOSIS — Z8349 Family history of other endocrine, nutritional and metabolic diseases: Secondary | ICD-10-CM

## 2018-05-14 DIAGNOSIS — Z6823 Body mass index (BMI) 23.0-23.9, adult: Secondary | ICD-10-CM

## 2018-05-14 DIAGNOSIS — C679 Malignant neoplasm of bladder, unspecified: Secondary | ICD-10-CM | POA: Diagnosis present

## 2018-05-14 DIAGNOSIS — Z87891 Personal history of nicotine dependence: Secondary | ICD-10-CM

## 2018-05-14 DIAGNOSIS — E0590 Thyrotoxicosis, unspecified without thyrotoxic crisis or storm: Secondary | ICD-10-CM | POA: Diagnosis present

## 2018-05-14 DIAGNOSIS — I255 Ischemic cardiomyopathy: Secondary | ICD-10-CM | POA: Diagnosis present

## 2018-05-14 DIAGNOSIS — Z8673 Personal history of transient ischemic attack (TIA), and cerebral infarction without residual deficits: Secondary | ICD-10-CM

## 2018-05-14 DIAGNOSIS — N179 Acute kidney failure, unspecified: Secondary | ICD-10-CM | POA: Diagnosis present

## 2018-05-14 DIAGNOSIS — I739 Peripheral vascular disease, unspecified: Secondary | ICD-10-CM | POA: Diagnosis present

## 2018-05-14 DIAGNOSIS — G4730 Sleep apnea, unspecified: Secondary | ICD-10-CM | POA: Diagnosis present

## 2018-05-14 DIAGNOSIS — Z9841 Cataract extraction status, right eye: Secondary | ICD-10-CM

## 2018-05-14 DIAGNOSIS — I480 Paroxysmal atrial fibrillation: Secondary | ICD-10-CM | POA: Diagnosis present

## 2018-05-14 DIAGNOSIS — Z85820 Personal history of malignant melanoma of skin: Secondary | ICD-10-CM

## 2018-05-14 DIAGNOSIS — E440 Moderate protein-calorie malnutrition: Secondary | ICD-10-CM | POA: Diagnosis present

## 2018-05-14 DIAGNOSIS — Z885 Allergy status to narcotic agent status: Secondary | ICD-10-CM

## 2018-05-14 DIAGNOSIS — I5043 Acute on chronic combined systolic (congestive) and diastolic (congestive) heart failure: Secondary | ICD-10-CM | POA: Diagnosis not present

## 2018-05-14 DIAGNOSIS — N1330 Unspecified hydronephrosis: Secondary | ICD-10-CM

## 2018-05-14 DIAGNOSIS — Z95811 Presence of heart assist device: Secondary | ICD-10-CM | POA: Diagnosis not present

## 2018-05-14 DIAGNOSIS — Z79899 Other long term (current) drug therapy: Secondary | ICD-10-CM

## 2018-05-14 DIAGNOSIS — N132 Hydronephrosis with renal and ureteral calculous obstruction: Secondary | ICD-10-CM | POA: Diagnosis present

## 2018-05-14 DIAGNOSIS — Z955 Presence of coronary angioplasty implant and graft: Secondary | ICD-10-CM

## 2018-05-14 DIAGNOSIS — G4700 Insomnia, unspecified: Secondary | ICD-10-CM | POA: Diagnosis present

## 2018-05-14 DIAGNOSIS — E785 Hyperlipidemia, unspecified: Secondary | ICD-10-CM | POA: Diagnosis present

## 2018-05-14 DIAGNOSIS — Z9842 Cataract extraction status, left eye: Secondary | ICD-10-CM

## 2018-05-14 DIAGNOSIS — I2510 Atherosclerotic heart disease of native coronary artery without angina pectoris: Secondary | ICD-10-CM | POA: Diagnosis present

## 2018-05-14 DIAGNOSIS — Y831 Surgical operation with implant of artificial internal device as the cause of abnormal reaction of the patient, or of later complication, without mention of misadventure at the time of the procedure: Secondary | ICD-10-CM | POA: Diagnosis present

## 2018-05-14 DIAGNOSIS — Z823 Family history of stroke: Secondary | ICD-10-CM

## 2018-05-14 DIAGNOSIS — J189 Pneumonia, unspecified organism: Secondary | ICD-10-CM

## 2018-05-14 DIAGNOSIS — Z7901 Long term (current) use of anticoagulants: Secondary | ICD-10-CM

## 2018-05-14 DIAGNOSIS — Z79890 Hormone replacement therapy (postmenopausal): Secondary | ICD-10-CM

## 2018-05-14 DIAGNOSIS — Z95810 Presence of automatic (implantable) cardiac defibrillator: Secondary | ICD-10-CM

## 2018-05-14 DIAGNOSIS — I252 Old myocardial infarction: Secondary | ICD-10-CM

## 2018-05-14 LAB — TYPE AND SCREEN
ABO/RH(D): O POS
Antibody Screen: NEGATIVE

## 2018-05-14 LAB — COMPREHENSIVE METABOLIC PANEL
ALBUMIN: 3.5 g/dL (ref 3.5–5.0)
ALT: 14 U/L (ref 0–44)
AST: 24 U/L (ref 15–41)
Alkaline Phosphatase: 140 U/L — ABNORMAL HIGH (ref 38–126)
Anion gap: 6 (ref 5–15)
BUN: 36 mg/dL — AB (ref 8–23)
CALCIUM: 9.2 mg/dL (ref 8.9–10.3)
CO2: 24 mmol/L (ref 22–32)
CREATININE: 1.5 mg/dL — AB (ref 0.61–1.24)
Chloride: 107 mmol/L (ref 98–111)
GFR calc Af Amer: 50 mL/min — ABNORMAL LOW (ref >60)
GFR calc non Af Amer: 43 mL/min — ABNORMAL LOW (ref >60)
GLUCOSE: 118 mg/dL — AB (ref 70–99)
Potassium: 4.1 mmol/L (ref 3.5–5.1)
SODIUM: 137 mmol/L (ref 135–145)
Total Bilirubin: 0.7 mg/dL (ref 0.3–1.2)
Total Protein: 7.6 g/dL (ref 6.5–8.1)

## 2018-05-14 LAB — PROTIME-INR
INR: 1.95
INR: 1.79
PROTHROMBIN TIME: 20.6 s — AB (ref 11.4–15.2)
Prothrombin Time: 22.1 seconds — ABNORMAL HIGH (ref 11.4–15.2)

## 2018-05-14 LAB — CBC WITH DIFFERENTIAL/PLATELET
BASOS ABS: 0.1 10*3/uL (ref 0.0–0.1)
BASOS PCT: 1 %
EOS ABS: 0.2 10*3/uL (ref 0.0–0.7)
Eosinophils Relative: 3 %
HCT: 31.9 % — ABNORMAL LOW (ref 39.0–52.0)
Hemoglobin: 9.3 g/dL — ABNORMAL LOW (ref 13.0–17.0)
Lymphocytes Relative: 10 %
Lymphs Abs: 0.5 10*3/uL — ABNORMAL LOW (ref 0.7–4.0)
MCH: 24 pg — AB (ref 26.0–34.0)
MCHC: 29.2 g/dL — ABNORMAL LOW (ref 30.0–36.0)
MCV: 82.4 fL (ref 78.0–100.0)
MONO ABS: 0.7 10*3/uL (ref 0.1–1.0)
Monocytes Relative: 14 %
Neutro Abs: 3.8 10*3/uL (ref 1.7–7.7)
Neutrophils Relative %: 72 %
PLATELETS: 203 10*3/uL (ref 150–400)
RBC: 3.87 MIL/uL — ABNORMAL LOW (ref 4.22–5.81)
RDW: 22.8 % — ABNORMAL HIGH (ref 11.5–15.5)
WBC: 5.3 10*3/uL (ref 4.0–10.5)

## 2018-05-14 LAB — LACTATE DEHYDROGENASE: LDH: 249 U/L — ABNORMAL HIGH (ref 98–192)

## 2018-05-14 LAB — T4, FREE: Free T4: 1.05 ng/dL (ref 0.82–1.77)

## 2018-05-14 LAB — TSH: TSH: 10.124 u[IU]/mL — ABNORMAL HIGH (ref 0.350–4.500)

## 2018-05-14 LAB — MAGNESIUM: MAGNESIUM: 2.2 mg/dL (ref 1.7–2.4)

## 2018-05-14 LAB — MRSA PCR SCREENING: MRSA BY PCR: POSITIVE — AB

## 2018-05-14 MED ORDER — ADULT MULTIVITAMIN W/MINERALS CH
1.0000 | ORAL_TABLET | Freq: Every day | ORAL | Status: DC
  Administered 2018-05-15 – 2018-05-21 (×7): 1 via ORAL
  Filled 2018-05-14 (×7): qty 1

## 2018-05-14 MED ORDER — ATORVASTATIN CALCIUM 80 MG PO TABS
80.0000 mg | ORAL_TABLET | Freq: Every day | ORAL | Status: DC
  Administered 2018-05-14 – 2018-05-20 (×7): 80 mg via ORAL
  Filled 2018-05-14 (×7): qty 1

## 2018-05-14 MED ORDER — LEVOTHYROXINE SODIUM 150 MCG PO TABS
150.0000 ug | ORAL_TABLET | Freq: Every day | ORAL | Status: DC
  Administered 2018-05-15 – 2018-05-21 (×7): 150 ug via ORAL
  Filled 2018-05-14 (×8): qty 1

## 2018-05-14 MED ORDER — PANTOPRAZOLE SODIUM 40 MG PO TBEC
40.0000 mg | DELAYED_RELEASE_TABLET | Freq: Two times a day (BID) | ORAL | Status: DC
  Administered 2018-05-14 – 2018-05-21 (×14): 40 mg via ORAL
  Filled 2018-05-14 (×14): qty 1

## 2018-05-14 MED ORDER — SILDENAFIL CITRATE 20 MG PO TABS
20.0000 mg | ORAL_TABLET | Freq: Three times a day (TID) | ORAL | Status: DC
  Administered 2018-05-14 – 2018-05-21 (×20): 20 mg via ORAL
  Filled 2018-05-14 (×21): qty 1

## 2018-05-14 MED ORDER — PROMETHAZINE HCL 25 MG PO TABS
12.5000 mg | ORAL_TABLET | Freq: Four times a day (QID) | ORAL | Status: DC | PRN
Start: 2018-05-14 — End: 2018-05-21

## 2018-05-14 MED ORDER — AMIODARONE HCL 200 MG PO TABS
200.0000 mg | ORAL_TABLET | Freq: Every day | ORAL | Status: DC
Start: 2018-05-14 — End: 2018-05-21
  Administered 2018-05-15 – 2018-05-21 (×7): 200 mg via ORAL
  Filled 2018-05-14 (×7): qty 1

## 2018-05-14 MED ORDER — DOCUSATE SODIUM 100 MG PO CAPS: 200.0000 mg | ORAL_CAPSULE | Freq: Every day | ORAL | Status: DC | PRN

## 2018-05-14 MED ORDER — RANOLAZINE ER 500 MG PO TB12
500.0000 mg | ORAL_TABLET | Freq: Two times a day (BID) | ORAL | Status: DC
  Administered 2018-05-14 – 2018-05-21 (×13): 500 mg via ORAL
  Filled 2018-05-14 (×13): qty 1

## 2018-05-14 MED ORDER — ONDANSETRON HCL 4 MG/2ML IJ SOLN: 4.0000 mg | Freq: Four times a day (QID) | INTRAMUSCULAR | Status: DC | PRN

## 2018-05-14 MED ORDER — ZINC GLUCONATE 50 MG PO TABS: 50.0000 mg | ORAL_TABLET | Freq: Two times a day (BID) | ORAL | Status: DC

## 2018-05-14 MED ORDER — ENSURE ENLIVE PO LIQD
237.0000 mL | Freq: Two times a day (BID) | ORAL | Status: DC
  Administered 2018-05-15 – 2018-05-17 (×3): 237 mL via ORAL

## 2018-05-14 MED ORDER — TEMAZEPAM 15 MG PO CAPS: 30.0000 mg | ORAL_CAPSULE | Freq: Every evening | ORAL | Status: DC | PRN

## 2018-05-14 MED ORDER — HEPARIN (PORCINE) IN NACL 100-0.45 UNIT/ML-% IJ SOLN
1300.0000 [IU]/h | INTRAMUSCULAR | Status: DC
  Administered 2018-05-14: 1000 [IU]/h via INTRAVENOUS
  Filled 2018-05-14: qty 250

## 2018-05-14 MED ORDER — ACETAMINOPHEN 325 MG PO TABS
650.0000 mg | ORAL_TABLET | ORAL | Status: DC | PRN
  Administered 2018-05-17 – 2018-05-18 (×3): 650 mg via ORAL
  Filled 2018-05-14 (×3): qty 2

## 2018-05-14 NOTE — Progress Notes (Signed)
ANTICOAGULATION CONSULT NOTE - Initial Consult  Pharmacy Consult for heparin Indication: LVAD  Allergies  Allergen Reactions  . Demerol [Meperidine] Other (See Comments)    Paralysis/ Could only move eyes    Patient Measurements: Ht: 6' Wt: 78.6 kg Heparin Dosing Weight: 78.6 kg  Vital Signs: Temp: 97.7 F (36.5 C) (07/10 1055) Temp Source: Oral (07/10 1055) BP: 93/81 (07/10 1055) Pulse Rate: 60 (07/10 1055)  Labs: Recent Labs    05/12/18 05/14/18 1030  HGB  --  9.3*  HCT  --  31.9*  PLT  --  203  LABPROT  --  22.1*  INR 2.6 1.95  CREATININE  --  1.50*    CrCl cannot be calculated (Unknown ideal weight.).   Medical History: Past Medical History:  Diagnosis Date  . Arthritis   . Automatic implantable cardioverter-defibrillator in situ    a. s/p Medtronic Evera device serial [id number]    . Bradycardia   . CAD (coronary artery disease)    a. s/p MI 1982;  s/p DES to CFX 2011;  s/p NSTEMI 4/12 in setting of VTach;  cath 7/12:  LM ok, LAD mid 30-40%, Dx's with 40%, mCFX stent patent, prox to mid RCA occluded with R to R and L to R collats.  Medical Tx was continued  . Chronic systolic heart failure ([place])    a. s/p LVAD 10/2013 for destination therapy  . CKD (chronic kidney disease)   . CVD (cerebrovascular disease)    a. s/p L CEA 2008  . Cytopenia   . Dysrhythmia    AFIB  . History of GI bleed    a. on ASA- started on plavix during 10/2015 admission for CVA  . HLD (hyperlipidemia)   . HTN (hypertension)   . Hypothyroidism   . Inappropriate therapy from implantable cardioverter-defibrillator    a. ATP for sinus tach SVT wavelet identified SVT but was no  passive (2014)  . Ischemic cardiomyopathy   . 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 L Ear   . Myocardial infarction ([place])    1992  . PAF (paroxysmal atrial fibrillation) ([place])   . PVD (peripheral vascular disease) ([place])    a. 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 Shortness of breath dyspnea    W/ EXERTION   . Sleep apnea    NO CPAP NOW  HE SENT IT BACK YRS AGO  . Stroke [facility])    a. 10/2015 admission   . Ventricular tachycardia ([place])    a. s/p AICD;   h/o ICD shock;  Amiodarone Rx. S/P ablation in 2012    Medications:  Scheduled:  . amiodarone  200 mg Oral Daily  . atorvastatin  80 mg Oral q1800  . [START ON 05/15/2018] levothyroxine  150 mcg Oral QAC breakfast  . pantoprazole  40 mg Oral BID  . ranolazine  500 mg Oral BID  . sildenafil  20 mg Oral TID    Assessment: 50 yom presenting for planned bladder tumor resection on Friday, with hx of LVAD HM II, on warfarin PTA. Warfarin regimen is 2 mg daily (last dose on 7/7). Pharmacy consulted to hold warfarin and start heparin when INR<1.8.  INR on admission was 1.95 this morning. Hgb is 9.3, plt 203, LDH stable at 249.   Goal of Therapy:  Heparin level 0.3-0.5 units/ml  INR 2-2.5  Monitor platelets by anticoagulation protocol: Yes   Plan:  Continue to hold warfarin therapy Start heparin when INR<1.8 - will order repeat INR tonight at 1800 Monitor daily CBC, INR, and for s/sx of bleeding  Doylene Canard, PharmD Clinical Pharmacist  Pager: [phone] Phone: [phone] 05/14/2018,12:24 PM

## 2018-05-14 NOTE — Progress Notes (Signed)
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  GOAL:   Patient will meet greater than or equal to 90% of their needs  MONITOR:   PO intake, Supplement acceptance, Labs, Weight trends, Skin, I & O's  REASON FOR ASSESSMENT:   Malnutrition Screening Tool    ASSESSMENT:   [name] is a 77 y.o. male  with h/o VT, PAD and severe systolic HF (EF 57-26%) due to mixed ischemic/nonischemic CM he is s/p HM II LVAD implant for destination therapy on October 19, 2013. VAD implantation was complicated by VT, syncope, AF/Aflutter and RHF. Required short term milrinone. Diagnosed with bladder tumor 11/2017  Pt preadmitted with bladder tumor resection scheduled for 05/16/18.   Spoke with pt at bedside, who reports a declining appetite since starting chemotherapy for bladder tumor this past March. He shares that he still tries to eat, but eats smaller portions than usual. His meal intake pattern and foods he eats are typically the same (3 meals per day- Breakfast: large bowl or cereal ("I've been doing that for years", Lunch and Dinner: meat, potatoes, and veggies prepared by significant other [name])). Pt reports he ate a good lunch of vegetable soup for lunch (meal completion 100%).   Pt reports he weighs himself daily and estimates he has lost 30# since he started chemo. He reports UBW is around 190-200# and now usually weighs around 178-180#. Reviewed wt hx, which reveals a 13.5% wt loss over the past 3 months, which is  significant for time frame.   Case discussed with RN, who reports that pt ate a good lunch, but admits that pt appears more frail on this visit from last to her recollection. Interview cut short due to heart failure team evaluation. Pt would benefit from addition of oral nutrition supplements, which this RD will order.   Labs reviewed.   NUTRITION - FOCUSED PHYSICAL EXAM:    Most Recent Value  Orbital Region  Mild depletion  Upper Arm Region  Mild depletion  Thoracic and Lumbar Region  No depletion  Buccal Region  No depletion  Temple Region  Mild depletion  Clavicle Bone Region  Mild depletion  Clavicle and Acromion Bone Region  No depletion  Scapular Bone Region  No depletion  Dorsal Hand  Mild depletion  Patellar Region  Mild depletion  Anterior Thigh Region  Mild depletion  Posterior Calf Region  Mild depletion  Edema (RD Assessment)  Mild  Hair  Reviewed  Eyes  Reviewed  Mouth  Reviewed  Skin  Reviewed  Nails  Reviewed       Diet Order:   Diet Order           Diet 2 gram sodium Room service appropriate? Yes; Fluid consistency: Thin  Diet effective now          EDUCATION NEEDS:   No education needs have been identified at this time  Skin:  Skin Assessment: Reviewed RN Assessment  Last BM:  05/14/18  Height:   Ht Readings from Last 1 Encounters:  05/14/18 6' (1.829 m)  Weight:   Wt Readings from Last 1 Encounters:  05/14/18 173 lb 4.5 oz (78.6 kg)    Ideal Body Weight:  80.9 kg  BMI:  Body mass index is 23.5 kg/m.  Estimated Nutritional Needs:   Kcal:  2200-2400  Protein:  115-130 grams  Fluid:  2.2-2.4 L    Denarius Sesler A. Jimmye Norman, RD, LDN, CDE Pager: [phone] After hours Pager: [phone]

## 2018-05-14 NOTE — Progress Notes (Signed)
[place] for heparin Indication: LVAD  Allergies  Allergen Reactions  . Demerol [Meperidine] Other (See Comments)    Paralysis/ Could only move eyes    Patient Measurements: Ht: 6' Wt: 78.6 kg Heparin Dosing Weight: 78.6 kg  Vital Signs: Temp: 97.7 F (36.5 C) (07/10 1947) Temp Source: Oral (07/10 1947) BP: 105/89 (07/10 1947) Pulse Rate: 61 (07/10 1947)  Labs: Recent Labs    05/12/18 05/14/18 1030 05/14/18 1831  HGB  --  9.3*  --   HCT  --  31.9*  --   PLT  --  203  --   LABPROT  --  22.1* 20.6*  INR 2.6 1.95 1.79  CREATININE  --  1.50*  --     Estimated Creatinine Clearance: 44.5 mL/min (A) (by C-G formula based on SCr of 1.5 mg/dL (H)).   Medical History: Past Medical History:  Diagnosis Date  . Arthritis   . Automatic implantable cardioverter-defibrillator in situ    a. s/p Medtronic Evera device serial [id number]    . Bradycardia   . CAD (coronary artery disease)    a. s/p MI 1982;  s/p DES to CFX 2011;  s/p NSTEMI 4/12 in setting of VTach;  cath 7/12:  LM ok, LAD mid 30-40%, Dx's with 40%, mCFX stent patent, prox to mid RCA occluded with R to R and L to R collats.  Medical Tx was continued  . Chronic systolic heart failure ([place])    a. s/p LVAD 10/2013 for destination therapy  . CKD (chronic kidney disease)   . CVD (cerebrovascular disease)    a. s/p L CEA 2008  . Cytopenia   . Dysrhythmia    AFIB  . History of GI bleed    a. on ASA- started on plavix during 10/2015 admission for CVA  . HLD (hyperlipidemia)   . HTN (hypertension)   . Hypothyroidism   . Inappropriate therapy from implantable cardioverter-defibrillator    a. ATP for sinus tach SVT wavelet identified SVT but was no  passive (2014)  . Ischemic cardiomyopathy   . 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 L Ear   . Myocardial infarction ([place])    1992  . PAF (paroxysmal atrial fibrillation) ([place])   . PVD (peripheral vascular disease) ([place])    a. h/o  claudication;  s/p R fem to pop BPG 3/11;  s/p L CFA BPG 7/03;  s/p repair of inf AAA 6/03;  s/p aorta to bilat renal BPG 6/03  . Shortness of breath dyspnea    W/ EXERTION   . Sleep apnea    NO CPAP NOW  HE SENT IT BACK YRS AGO  . Stroke [facility])    a. 10/2015 admission   . Ventricular tachycardia ([place])    a. s/p AICD;   h/o ICD shock;  Amiodarone Rx. S/P ablation in 2012    Medications:  Scheduled:  . amiodarone  200 mg Oral Daily  . atorvastatin  80 mg Oral q1800  . [START ON 05/15/2018] feeding supplement (ENSURE ENLIVE)  237 mL Oral BID BM  . [START ON 05/15/2018] levothyroxine  150 mcg Oral QAC breakfast  . [START ON 05/15/2018] multivitamin with minerals  1 tablet Oral Daily  . pantoprazole  40 mg Oral BID  . ranolazine  500 mg Oral BID  . sildenafil  20 mg Oral TID    Assessment: 58 yom presenting for planned bladder tumor resection on Friday, with hx of LVAD HM II,  on warfarin PTA. Warfarin regimen is 2 mg daily (last dose on 7/7). Pharmacy consulted to hold warfarin and start heparin when INR<1.8. -INR= 1.79   Goal of Therapy:  Heparin level 0.3-0.5 units/ml INR 2-2.5  Monitor platelets by anticoagulation protocol: Yes   Plan: -Start heparin at 1000 units/hr -Heparin level in 8 hours and daily wth CBC daily  Hildred Laser, PharmD Clinical Pharmacist Please check Amion for pharmacy contact number      05/14/2018,9:31 PM

## 2018-05-14 NOTE — H&P (Addendum)
Advanced Heart Failure VAD History and Physical Note   PCP-Cardiologist: No primary care provider on file.   Reason for Admission: Preadmit for bladder tumor resection on Friday  HPI:    [name] is a 77 y.o. male with h/o VT, PAD and severe systolic HF (EF 47-82%) due to mixed ischemic/nonischemic CM he is s/p HM II LVAD implant for destination therapy on October 19, 2013. VAD implantation was complicated by VT, syncope, AF/Aflutter and RHF. Required short term milrinone. Diagnosed with bladder tumor 11/2017  GU Event 09/01/14 had impacted ureteral stone and underwent cystoscopy and flexible ureteroscopy with lithotripsy and basket extraction.  09/2015 Hematuria and chronic nephrolithiasis. Renal US showed mild hyronephrosis and bilateral renal cysts. Dr. Risa Grill took back for repeat lithotripsy and stone extraction by ureteroscopy and J stent placement. 12/18 Recurrent hematuria. 24mm left ureteral stone. Passed with tamulosin 1/19   Admitted with recurrent hematuria with ureteral stone. Stone removed but found to have large bladder mass which was removed. Course c/b obstructive uropathy and AKI requiring 1 session of HD.  3/19 Bladder chemo stopped after 2 treatments as unable to tolerate  GI Event  02/2015 - Hgb 5.4 --> EGD that showed 2 AVMs in the jejunum that were clipped.  4/2-18 - Episode of melena. Hgb 10.9-> 8.7. EGD/colonoscopy negative 12/18 - Melena with syncope  Hgb 6.5. Transfused 5u. 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  01/2016 R CEA. He was started on Plavix.  01/2016 Lumbar fusion L3-L5 with pedicle screws posterolateral arthrodesis with BMP, allograft  VT events 9/17 with high fevers and productive cough. Treated with levaquin x 7 days for acute bronchitis. Also found to have VT and underwent DC-CV. Seen by EP and amio increased 10/17 speed turned down to 8800 due to RV failure. 10/18 with slow VT and  ADHF.Cardioverted in ED and amio uptitrated.  04/01/18 recurrent VT. Underwent DC-CV in ER  Significant VAD Events  03/2017>percutaneous lead repair  Last seen in VAD clinic 04/2018. He was doing well except for choking episodes. He had brief one low flow alarm, which was not recorded. No medication changes. Dr Haroldine Laws planned to work with Dr Gloriann Loan on preadmission for bladder tumor resection.  He is being admitted today for bladder tumor resection on Friday. He feels great and is ready to get surgery over with. His last dose of coumadin was on Sunday. Last INR check was on Monday and was 2.6. Denies any bleeding. Denies orthopnea, PND, or edema. SOB on exertion is unchanged. Has not taken any PRN lasix. No fever/chills.   LVAD INTERROGATION:  HeartMate II LVAD:  Flow 3.8 liters/min, speed 8800, power 4.4, PI 6.8.  No PI events/24 hours. Personally reviewed.    Review of Systems: [y] = yes, [ ]  = no   General: Weight gain [ ] ; Weight loss [ ] ; Anorexia [ ] ; Fatigue [ ] ; Fever [ ] ; Chills [ ] ; Weakness [ ]   Cardiac: Chest pain/pressure [ ] ; Resting SOB [ ] ; Exertional SOB [ y]; Orthopnea [ ] ; Pedal Edema [ ] ; Palpitations [ ] ; Syncope [ ] ; Presyncope [ ] ; Paroxysmal nocturnal dyspnea[ ]   Pulmonary: Cough [ ] ; Wheezing[ ] ; Hemoptysis[ ] ; Sputum [ ] ; Snoring [ ]   GI: Vomiting[ ] ; Dysphagia[ ] ; Melena[ ] ; Hematochezia [ ] ; Heartburn[ ] ; Abdominal pain [ ] ; Constipation [ ] ; Diarrhea [ ] ; BRBPR [ ]   GU: Hematuria[ ] ; Dysuria [ ] ; Nocturia[ ]   Vascular: Pain  in legs with walking [ ] ; Pain in feet with lying flat [ ] ; Non-healing sores [ ] ; Stroke [ ] ; TIA [ ] ; Slurred speech [ ] ;  Neuro: Headaches[ ] ; Vertigo[ ] ; Seizures[ ] ; Paresthesias[ ] ;Blurred vision [ ] ; Diplopia [ ] ; Vision changes [ ]   Ortho/Skin: Arthritis [ ] ; Joint pain [ ] ; Muscle pain [ ] ; Joint swelling [ ] ; Back Pain [ ] ; Rash [ ]   Psych: Depression[ ] ; Anxiety[ ]   Heme: Bleeding problems [ ] ; Clotting disorders [ ] ; Anemia [ ]     Endocrine: Diabetes [ ] ; Thyroid dysfunction[ ]     Home Medications Prior to Admission medications   Medication Sig Start Date End Date Taking? Authorizing Provider  amiodarone (PACERONE) 200 MG tablet Take 200 mg by mouth daily.    Yes [provider]  atorvastatin (LIPITOR) 80 MG tablet TAKE 1 TABLET DAILY AT 6 PM Patient taking differently: Take 80 mg by mouth in the evening 07/22/17  Yes Bensimhon, Shaune Pascal, MD  Chlorpheniramine-DM (CORICIDIN COUGH/COLD) 4-30 MG TABS Take 1 tablet by mouth every 6 (six) hours as needed (for congestion and coughing).    Yes [provider]  Dextromethorphan-guaiFENesin (ROBITUSSIN COUGH+CHEST CONG DM PO) Take 20 mLs by mouth every 6 (six) hours as needed (for congestion and coughing).    Yes [provider]  docusate sodium (COLACE) 100 MG capsule Take 200 mg by mouth daily as needed for mild constipation.    Yes [provider]  furosemide (LASIX) 20 MG tablet Take 20 mg by mouth daily as needed for fluid.   Yes [provider]  levothyroxine (SYNTHROID, LEVOTHROID) 150 MCG tablet Take 1 tablet (150 mcg total) by mouth daily before breakfast. 10/25/17  Yes Bensimhon, Shaune Pascal, MD  Multiple Vitamins-Minerals (ICAPS PO) Take 1 tablet by mouth 2 (two) times daily.   Yes [provider]  Multiple Vitamins-Minerals (MULTIVITAMIN PO) Take 1 tablet by mouth daily.   Yes [provider]  pantoprazole (PROTONIX) 40 MG tablet TAKE 1 TABLET TWICE DAILY 05/05/18  Yes Bensimhon, Shaune Pascal, MD  promethazine (PHENERGAN) 12.5 MG tablet Take 12.5 mg by mouth every 6 (six) hours as needed for nausea or vomiting.   Yes [provider]  ranolazine (RANEXA) 500 MG 12 hr tablet Take 1 tablet (500 mg total) by mouth 2 (two) times daily. 01/27/18  Yes Bensimhon, Shaune Pascal, MD  sildenafil (REVATIO) 20 MG tablet Take 1 tablet (20 mg total) by mouth 3 (three) times daily. 04/21/18  Yes Bensimhon, Shaune Pascal, MD  temazepam  (RESTORIL) 30 MG capsule Take 1 capsule (30 mg total) by mouth at bedtime as needed for sleep. 02/12/18  Yes Bensimhon, Shaune Pascal, MD  warfarin (COUMADIN) 2 MG tablet Take 2 mg by mouth every evening.    Yes [provider]  zinc gluconate 50 MG tablet Take 50 mg by mouth 2 (two) times daily.    Yes [provider]    Past Medical History: Past Medical History:  Diagnosis Date  . Arthritis   . Automatic implantable cardioverter-defibrillator in situ    a. s/p Medtronic Evera device serial [id number]    . Bradycardia   . CAD (coronary artery disease)    a. s/p MI 1982;  s/p DES to CFX 2011;  s/p NSTEMI 4/12 in setting of VTach;  cath 7/12:  LM ok, LAD mid 30-40%, Dx's with 40%, mCFX stent patent, prox to mid RCA occluded with R to R and L to R collats.  Medical Tx was continued  . Chronic systolic heart failure ([place])    a. s/p LVAD 10/2013 for destination therapy  . CKD (chronic kidney disease)   . CVD (cerebrovascular disease)    a. s/p L CEA 2008  . Cytopenia   . Dysrhythmia    AFIB  . History of GI bleed    a. on ASA- started on plavix during 10/2015 admission for CVA  . HLD (hyperlipidemia)   . HTN (hypertension)   . Hypothyroidism   . Inappropriate therapy from implantable cardioverter-defibrillator    a. ATP for sinus tach SVT wavelet identified SVT but was no  passive (2014)  . Ischemic cardiomyopathy   . 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 L Ear   . Myocardial infarction ([place])    1992  . PAF (paroxysmal atrial fibrillation) ([place])   . PVD (peripheral vascular disease) ([place])    a. 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 Shortness of breath dyspnea    W/ EXERTION   . Sleep apnea    NO CPAP NOW  HE SENT IT BACK YRS AGO  . Stroke [facility])    a. 10/2015 admission   . Ventricular tachycardia ([place])    a. s/p AICD;   h/o ICD shock;  Amiodarone Rx. S/P ablation in 2012     Past Surgical History: Past Surgical History:  Procedure Laterality Date  . BACK SURGERY    . CARDIAC CATHETERIZATION     "several" (05/25/2013)  . CARDIAC DEFIBRILLATOR PLACEMENT  2003; 2005; 05/25/2013   2014: Medtronic Evera device serial number [id number]  . CARDIAC ELECTROPHYSIOLOGY STUDY AND ABLATION  2012  . CARDIOVERSION N/A 10/15/2013   Procedure: CARDIOVERSION;  Surgeon: Jolaine Artist, MD;  Location: [place];  Service: Cardiovascular;  Laterality: N/A;  . CARDIOVERSION N/A 11/04/2013   Procedure: CARDIOVERSION;  Surgeon: Larey Dresser, MD;  Location: [place];  Service: Cardiovascular;  Laterality: N/A;  . CARDIOVERSION N/A 06/04/2014   Procedure: CARDIOVERSION;  Surgeon: Jolaine Artist, MD;  Location: [facility] ENDOSCOPY;  Service: Cardiovascular;  Laterality: N/A;  . CARDIOVERSION N/A 03/02/2015   Procedure: CARDIOVERSION;  Surgeon: Jolaine Artist, MD;  Location: [facility] ENDOSCOPY;  Service: Cardiovascular;  Laterality: N/A;  . CARDIOVERSION N/A 01/19/2016   Procedure: CARDIOVERSION;  Surgeon: Larey Dresser, MD;  Location: [place];  Service: Cardiovascular;  Laterality: N/A;  . CAROTID ENDARTERECTOMY Left ~ 2007  . CATARACT EXTRACTION W/ INTRAOCULAR LENS  IMPLANT, BILATERAL Bilateral 2000  . CHOLECYSTECTOMY  12/15/?2010  . COLONOSCOPY WITH PROPOFOL N/A May 24, 202018   Procedure: COLONOSCOPY WITH PROPOFOL;  Surgeon: Ronald Lobo, MD;  Location: [place];  Service: Endoscopy;  Laterality: N/A;  . CORONARY ANGIOPLASTY  1992  . CORONARY ANGIOPLASTY WITH STENT PLACEMENT     "last one was 11/2012  (05/25/2013)  . CYSTOSCOPY WITH RETROGRADE PYELOGRAM, URETEROSCOPY AND STENT PLACEMENT Left 08/09/2014   Procedure: CYSTOSCOPY WITH RETROGRADE PYELOGRAM, URETEROSCOPY AND STENT PLACEMENT;  Surgeon: Bernestine Amass, MD;  Location: [facility];  Service: Urology;  Laterality: Left;  . CYSTOSCOPY WITH RETROGRADE PYELOGRAM, URETEROSCOPY AND STENT PLACEMENT Left 09/01/2014   Procedure:  CYSTOSCOPY WITH URETEROSCOPY AND STENT REMOVAL;  Surgeon: Bernestine Amass, MD;  Location: [facility];  Service: Urology;  Laterality: Left;  . CYSTOSCOPY WITH RETROGRADE PYELOGRAM, URETEROSCOPY AND STENT PLACEMENT Left 09/20/2014   Procedure:  CYSTOSCOPY WITH RETROGRADE PYELOGRAM, URETEROSCOPY AND STENT PLACEMENT, stone removal;  Surgeon: Bernestine Amass, MD;  Location: [facility];  Service: Urology;  Laterality: Left;  . CYSTOSCOPY WITH RETROGRADE PYELOGRAM, URETEROSCOPY AND STENT PLACEMENT Bilateral 12/04/2017   Procedure: CYSTOSCOPY WITH BILATERAL RETROGRADE PYELOGRAM, LEFT URETEROSCOPY AND STENT PLACEMENT;  Surgeon: Lucas Mallow, MD;  Location: [place];  Service: Urology;  Laterality: Bilateral;  . DOPPLER ECHOCARDIOGRAPHY  2011  . ELBOW ARTHROSCOPY Right 1990's  . ELECTROPHYSIOLOGIC STUDY N/A 07/19/2016   Procedure: Cardioversion;  Surgeon: Evans Lance, MD;  Location: [place] CV LAB;  Service: Cardiovascular;  Laterality: N/A;  . ENDARTERECTOMY Right 01/13/2016   Procedure: RIGHT CAROTID ARTERY ENDARTERECTOMY;  Surgeon: Mal Misty, MD;  Location: [place];  Service: Vascular;  Laterality: Right;  . ENTEROSCOPY Left 02/23/2017   Procedure: ENTEROSCOPY;  Surgeon: Ronald Lobo, MD;  Location: [facility] ENDOSCOPY;  Service: Endoscopy;  Laterality: Left;  . ENTEROSCOPY N/A 10/17/2017   Procedure: ENTEROSCOPY;  Surgeon: Yetta Flock, MD;  Location: [facility] ENDOSCOPY;  Service: Gastroenterology;  Laterality: N/A;  LVAD pt.    . ESOPHAGOGASTRODUODENOSCOPY N/A 02/15/2015   Procedure: ESOPHAGOGASTRODUODENOSCOPY (EGD);  Surgeon: Carol Ada, MD;  Location: [facility] ENDOSCOPY;  Service: Endoscopy;  Laterality: N/A;  LVAD  . EYE SURGERY     VITRECTOMY  LEFT 05/2012  . FEMORAL-POPLITEAL BYPASS GRAFT Right 2011  . FEMORAL-POPLITEAL BYPASS GRAFT Left 05/2002   Archie Endo 05/06/2002 (05/25/2013)  . HEEL SPUR SURGERY Right 1990's  . HOLMIUM LASER APPLICATION Left 20/25/4270   Procedure: HOLMIUM LASER APPLICATION;  Surgeon:  Bernestine Amass, MD;  Location: [facility];  Service: Urology;  Laterality: Left;  . HOLMIUM LASER APPLICATION Left 04/28/7627   Procedure: HOLMIUM LASER APPLICATION;  Surgeon: Lucas Mallow, MD;  Location: [place];  Service: Urology;  Laterality: Left;  . IMPLANTABLE CARDIOVERTER DEFIBRILLATOR GENERATOR CHANGE N/A 05/25/2013   Procedure: IMPLANTABLE CARDIOVERTER DEFIBRILLATOR GENERATOR CHANGE;  Surgeon: Deboraha Sprang, MD;  Location: [facility] CATH LAB;  Service: Cardiovascular;  Laterality: N/A;  . INSERTION OF IMPLANTABLE LEFT VENTRICULAR ASSIST DEVICE N/A 10/19/2013   Procedure: INSERTION OF IMPLANTABLE LEFT VENTRICULAR ASSIST DEVICE;  Surgeon: Gaye Pollack, MD;  Location: [place];  Service: Open Heart Surgery;  Laterality: N/A;  CIRC ARREST  NITRIC OXIDE  MEDTRONIC ICD  . INTRAOPERATIVE TRANSESOPHAGEAL ECHOCARDIOGRAM N/A 10/19/2013   Procedure: INTRAOPERATIVE TRANSESOPHAGEAL ECHOCARDIOGRAM;  Surgeon: Gaye Pollack, MD;  Location: [facility] OR;  Service: Open Heart Surgery;  Laterality: N/A;  . IR FLUORO GUIDE CV LINE RIGHT  12/10/2017  . IR US GUIDE VASC ACCESS RIGHT  12/10/2017  . KNEE ARTHROSCOPY Bilateral 1990's  . LEAD REVISION N/A 06/05/2013   Procedure: LEAD REVISION;  Surgeon: Thompson Grayer, MD;  Location: [facility] CATH LAB;  Service: Cardiovascular;  Laterality: N/A;  . LEFT HEART CATHETERIZATION WITH CORONARY ANGIOGRAM N/A 11/24/2012   Procedure: LEFT HEART CATHETERIZATION WITH CORONARY ANGIOGRAM;  Surgeon: Peter M Martinique, MD;  Location: [facility] CATH LAB;  Service: Cardiovascular;  Laterality: N/A;  . LIPOMA EXCISION Left 2013   "near ear" (05/25/2013)  . [place]; 2000's  . PATCH ANGIOPLASTY Right 01/13/2016   Procedure: WITH  PATCH ANGIOPLASTY;  Surgeon: Mal Misty, MD;  Location: [place];  Service: Vascular;  Laterality: Right;  . RENAL ARTERY BYPASS Bilateral 2003  . REVASCULARIZATION / IN-SITU GRAFT LEG    . RIGHT HEART CATH N/A 11/29/2017   Procedure: RIGHT HEART CATH;  Surgeon:  Jolaine Artist,  MD;  Location: [place] CV LAB;  Service: Cardiovascular;  Laterality: N/A;  . RIGHT HEART CATHETERIZATION N/A 09/17/2013   Procedure: RIGHT HEART CATH;  Surgeon: Jolaine Artist, MD;  Location: [facility] CATH LAB;  Service: Cardiovascular;  Laterality: N/A;  . RIGHT HEART CATHETERIZATION N/A 10/14/2013   Procedure: RIGHT HEART CATH;  Surgeon: Jolaine Artist, MD;  Location: [facility] CATH LAB;  Service: Cardiovascular;  Laterality: N/A;  . RIGHT HEART CATHETERIZATION N/A 11/16/2013   Procedure: RIGHT HEART CATH;  Surgeon: Jolaine Artist, MD;  Location: [facility] CATH LAB;  Service: Cardiovascular;  Laterality: N/A;  . RIGHT HEART CATHETERIZATION N/A 07/13/2014   Procedure: RIGHT HEART CATH;  Surgeon: Jolaine Artist, MD;  Location: [facility] CATH LAB;  Service: Cardiovascular;  Laterality: N/A;  . TEE WITHOUT CARDIOVERSION N/A 11/04/2013   Procedure: TRANSESOPHAGEAL ECHOCARDIOGRAM (TEE);  Surgeon: Larey Dresser, MD;  Location: [place];  Service: Cardiovascular;  Laterality: N/A;  . TEE WITHOUT CARDIOVERSION N/A 03/02/2015   Procedure: TRANSESOPHAGEAL ECHOCARDIOGRAM (TEE);  Surgeon: Jolaine Artist, MD;  Location: [facility] ENDOSCOPY;  Service: Cardiovascular;  Laterality: N/A;  . TEE WITHOUT CARDIOVERSION N/A 01/19/2016   Procedure: TRANSESOPHAGEAL ECHOCARDIOGRAM (TEE);  Surgeon: Larey Dresser, MD;  Location: [place];  Service: Cardiovascular;  Laterality: N/A;  . TONSILLECTOMY  1946  . TRANSURETHRAL RESECTION OF BLADDER TUMOR N/A 12/04/2017   Procedure: TRANSURETHRAL RESECTION OF BLADDER TUMOR (TURBT);  Surgeon: Lucas Mallow, MD;  Location: [place];  Service: Urology;  Laterality: N/A;  . TUMOR EXCISION Left 1960's   "fatty tumor" (05/25/2013)  . V-TACH ABLATION N/A 09/12/2011   Procedure: V-TACH ABLATION;  Surgeon: Evans Lance, MD;  Location: [facility] CATH LAB;  Service: Cardiovascular;  Laterality: N/A;  . V-TACH ABLATION N/A 06/08/2013   Procedure: V-TACH ABLATION;  Surgeon:  Evans Lance, MD;  Location: [facility] CATH LAB;  Service: Cardiovascular;  Laterality: N/A;    Family History: Family History  Problem Relation Age of Onset  . Heart attack Mother   . Coronary artery disease Mother   . Cancer Mother   . Heart disease Mother   . Hyperlipidemia Mother   . Hypertension Mother   . Coronary artery disease Father   . Stroke Father   . Cancer Father   . Heart disease Father        before age 62  . Hyperlipidemia Father   . Hypertension Father   . Heart attack Father   . Coronary artery disease Unknown     Social History: Social History   Socioeconomic History  . Marital status: Divorced    Spouse name: Not on file  . Number of children: 2  . Years of education: Not on file  . Highest education level: Not on file  Occupational History  . Occupation: retired    Comment: Health visitor  . Occupation: Retired    Comment: Security [employer]  . Occupation: Part-time    Comment: Security at [employer]  . Financial resource strain: Not on file  . Food insecurity:    Worry: Not on file    Inability: Not on file  . Transportation needs:    Medical: Not on file    Non-medical: Not on file  Tobacco Use  . Smoking status: Former Smoker    Packs/day: 0.50    Years: 37.00    Pack years: 18.50    Types: Cigarettes    Last attempt to quit: 11/05/2001  Years since quitting: 16.5  . Smokeless tobacco: Never Used  Substance and Sexual Activity  . Alcohol use: Yes    Alcohol/week: 0.0 oz    Comment: 05/25/2013 "mixed drink couple times/month"  . Drug use: No  . Sexual activity: Never  Lifestyle  . Physical activity:    Days per week: Not on file    Minutes per session: Not on file  . Stress: Not on file  Relationships  . Social connections:    Talks on phone: Not on file    Gets together: Not on file    Attends religious service: Not on file    Active member of club or organization: Not on file    Attends meetings of clubs or  organizations: Not on file    Relationship status: Not on file  Other Topics Concern  . Not on file  Social History Narrative   Lives with sig other.    Allergies:  Allergies  Allergen Reactions  . Demerol [Meperidine] Other (See Comments)    Paralysis/ Could only move eyes    Objective:    Vital Signs:       There were no vitals filed for this visit.  Mean arterial Pressure 656 (modified systolic)   Physical Exam    General:   No resp difficulty HEENT: Normal Neck: supple. JVP 7-8. Carotids 2+ bilat; no bruits. No lymphadenopathy or thyromegaly appreciated. Cor: Mechanical heart sounds with LVAD hum present. Lungs: Clear Abdomen: soft, nontender, nondistended. No hepatosplenomegaly. No bruits or masses. Good bowel sounds. Driveline: C/D/I; securement device intact and driveline incorporated Extremities: no cyanosis, clubbing, rash, RLE trace edema Neuro: alert & orientedx3, cranial nerves grossly intact. moves all 4 extremities w/o difficulty. Affect pleasant   Telemetry   NSR 60s. Personally reviewed.   EKG   NSR 60 with LBBB. Personally reviewed.   Labs    Basic Metabolic Panel: No results for input(s): NA, K, CL, CO2, GLUCOSE, BUN, CREATININE, CALCIUM, MG, PHOS in the last 168 hours.  Liver Function Tests: No results for input(s): AST, ALT, ALKPHOS, BILITOT, PROT, ALBUMIN in the last 168 hours. No results for input(s): LIPASE, AMYLASE in the last 168 hours. No results for input(s): AMMONIA in the last 168 hours.  CBC: No results for input(s): WBC, NEUTROABS, HGB, HCT, MCV, PLT in the last 168 hours.  Cardiac Enzymes: No results for input(s): CKTOTAL, CKMB, CKMBINDEX, TROPONINI in the last 168 hours.  BNP: BNP (last 3 results) Recent Labs    08/17/17 1900  BNP 627.8*    ProBNP (last 3 results) No results for input(s): PROBNP in the last 8760 hours.   CBG: No results for input(s): GLUCAP in the last 168 hours.  Coagulation Studies: Recent  Labs    05/12/18  INR 2.6    Other results:    Imaging     No results found.    Patient Profile:   [name] is a 77 y.o. male with h/o VT, PAD and severe systolic HF (EF 81-27%) due to mixed ischemic/nonischemic CM he is s/p HM II LVAD implant for destination therapy on October 19, 2013. VAD implantation was complicated by VT, syncope, AF/Aflutter and RHF. Required short term milrinone.  Admitted for optimization and heparin bridge prior to bladder surgery on Friday.   Assessment/Plan:     1. Chronic systolic HF: s/p HM-II LVAD 10/2014.  - Recent RHC in 1/19 with mildly low output. Started on milrinone without benefit so it was stopped.  -  Volume status stable on exam. Takes PRN lasix at home. - VAD interrogated personally. Parameters stable.  - s/p previous driveline repair 03/9562. No further pump stops 2.VT  - underwent DC-CV 9/17, 08/17/17, and 04/01/18 - Continue amiodarone 200 mg daily  - Keep K > 4.0 Mg > 2.0 3. 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  - Had 2 BCG infusions with Dr. Gloriann Loan but discontinued due to inability to tolerate - Now with recurrent tumor and plans for tumor resection with Dr Gloriann Loan on Friday.  - Admitted today for warfarin hold and heparin bridge once INR <1.8 prior to surgery on Friday.  4. 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 Required HD x1 (11/2017) - Monitor daily BMET 5. 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 Continue coumadin and plavix.(with h/o CVA). - Admitted 4/18 with recurrent GIB. Transfused 2u. EGD/colon negative - Admitted 12./18. hgb 6.5, Transfused 5u. EGD with 2 AVMs treated with APC - Had been getting octreotide infusions but too expensive for him. Will continue to hold for now - Monitor CBC daily. Denies bleeding.  6. Atrial arrhythmias: Atrial fibrillation, atrial tachycardia.   S/p DC-CV 06/04/14, DC-CV 03/02/15 and 01/19/16.  - Remains in NSR - Continue amiodarone 200 mg daily 7. HTN:  - Modified systolic 875. Well controlled.  8. LVAD:  - VAD interrogated personally. Parameters stable. - LDH pending 9. Anticoagulation management:  - Goal INR 2-2.5 - INR pending. Coumadin on hold for surgery Friday. Will need to start heparin once INR <1.8. Discussed dosing with PharmD personally. 10. Lumbar compression fracture:  s/p L3-L5 fusion 02/03/16. - Resolved 11. 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  - Continue atorvastatin. (and warfarin INR goal 2-2.5). S/p R CEA 01/13/16. Plavix stopped 12/18 due to recurrent GIB 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 Forde Dandy in past.  - Amio restarted due to recurrent atrial arrhythmias and VT.  - Recent FT4 elevated. Synthroid cut to 150 daily in 12/18.  - Check TFTs.  13. Insomnia - Continue temazepam 30 qhs PRN.    I reviewed the LVAD parameters from today, and compared the results to the patient's prior recorded data.  No programming changes were made.  The LVAD is functioning within specified parameters.  The patient performs LVAD self-test daily.  LVAD interrogation was negative for any significant power changes, alarms or PI events/speed drops.  LVAD equipment check completed and is in good working order.  Back-up equipment present.   LVAD education done on emergency procedures and precautions and reviewed exit site care.  Length of Stay: [place], NP 05/14/2018, 9:25 AM  VAD Team Pager [phone] (7am - 7am) +++VAD ISSUES ONLY+++   Advanced Heart Failure Team Pager [phone] (M-F; [place])  Please contact [facility] for night-coverage after hours (4p -7a ) and weekends on [email or web address] for all non- LVAD Issues  Patient seen with NP, agree with the above note.    He is admitted for bladder tumor resection on Friday.  Holding warfarin, will start heparin gtt when INR down to 1.8.   He has no specific complaints today.   On exam, no JVD.  No edema.  Normal LVAD sounds.   LVAD interrogated, parameters normal.  Plan for surgery on Friday, will probably need to start heparin gtt tomorrow.    Continue  amiodarone for VT.   Loralie Champagne 05/14/2018 4:50 PM

## 2018-05-15 ENCOUNTER — Encounter (HOSPITAL_COMMUNITY): Payer: Self-pay

## 2018-05-15 DIAGNOSIS — E440 Moderate protein-calorie malnutrition: Secondary | ICD-10-CM

## 2018-05-15 LAB — CBC
HCT: 30.1 % — ABNORMAL LOW (ref 39.0–52.0)
Hemoglobin: 8.9 g/dL — ABNORMAL LOW (ref 13.0–17.0)
MCH: 24.4 pg — AB (ref 26.0–34.0)
MCHC: 29.6 g/dL — ABNORMAL LOW (ref 30.0–36.0)
MCV: 82.5 fL (ref 78.0–100.0)
PLATELETS: 207 10*3/uL (ref 150–400)
RBC: 3.65 MIL/uL — ABNORMAL LOW (ref 4.22–5.81)
RDW: 22.6 % — AB (ref 11.5–15.5)
WBC: 5.4 10*3/uL (ref 4.0–10.5)

## 2018-05-15 LAB — LACTATE DEHYDROGENASE: LDH: 237 U/L — AB (ref 98–192)

## 2018-05-15 LAB — BASIC METABOLIC PANEL
ANION GAP: 11 (ref 5–15)
BUN: 30 mg/dL — AB (ref 8–23)
CALCIUM: 8.7 mg/dL — AB (ref 8.9–10.3)
CO2: 23 mmol/L (ref 22–32)
Chloride: 104 mmol/L (ref 98–111)
Creatinine, Ser: 1.54 mg/dL — ABNORMAL HIGH (ref 0.61–1.24)
GFR calc Af Amer: 48 mL/min — ABNORMAL LOW (ref >60)
GFR, EST NON AFRICAN AMERICAN: 41 mL/min — AB (ref >60)
GLUCOSE: 109 mg/dL — AB (ref 70–99)
Potassium: 3.9 mmol/L (ref 3.5–5.1)
Sodium: 138 mmol/L (ref 135–145)

## 2018-05-15 LAB — PROTIME-INR
INR: 1.84
Prothrombin Time: 21.1 seconds — ABNORMAL HIGH (ref 11.4–15.2)

## 2018-05-15 LAB — HEPARIN LEVEL (UNFRACTIONATED)
HEPARIN UNFRACTIONATED: 0.11 [IU]/mL — AB (ref 0.30–0.70)
Heparin Unfractionated: 0.24 IU/mL — ABNORMAL LOW (ref 0.30–0.70)

## 2018-05-15 LAB — T3, FREE: T3 FREE: 1.5 pg/mL — AB (ref 2.0–4.4)

## 2018-05-15 LAB — MAGNESIUM: Magnesium: 2.2 mg/dL (ref 1.7–2.4)

## 2018-05-15 MED ORDER — POTASSIUM CHLORIDE CRYS ER 20 MEQ PO TBCR
20.0000 meq | EXTENDED_RELEASE_TABLET | Freq: Once | ORAL | Status: AC
  Administered 2018-05-15: 20 meq via ORAL
  Filled 2018-05-15: qty 1

## 2018-05-15 MED ORDER — MUPIROCIN 2 % EX OINT
1.0000 "application " | TOPICAL_OINTMENT | Freq: Two times a day (BID) | CUTANEOUS | Status: AC
  Administered 2018-05-15 – 2018-05-19 (×10): 1 via NASAL
  Filled 2018-05-15 (×2): qty 22

## 2018-05-15 MED ORDER — CEFAZOLIN SODIUM-DEXTROSE 2-4 GM/100ML-% IV SOLN
2.0000 g | INTRAVENOUS | Status: DC
  Filled 2018-05-15: qty 100

## 2018-05-15 NOTE — Progress Notes (Addendum)
[place] for heparin Indication: LVAD  Allergies  Allergen Reactions  . Demerol [Meperidine] Other (See Comments)    Paralysis/ Could only move eyes    Patient Measurements: Ht: 6' Wt: 78.6 kg Heparin Dosing Weight: 78.6 kg  Vital Signs: Temp: 97.6 F (36.4 C) (07/11 0741) Temp Source: Oral (07/11 0741) BP: 117/94 (07/11 0741) Pulse Rate: 60 (07/11 0741)  Labs: Recent Labs    05/14/18 1030 05/14/18 1831 05/15/18 0638  HGB 9.3*  --  8.9*  HCT 31.9*  --  30.1*  PLT 203  --  207  LABPROT 22.1* 20.6* 21.1*  INR 1.95 1.79 1.84  HEPARINUNFRC  --   --  0.11*  CREATININE 1.50*  --  1.54*    Estimated Creatinine Clearance: 43.4 mL/min (A) (by C-G formula based on SCr of 1.54 mg/dL (H)).  Assessment: 50 yom presenting for planned bladder tumor resection on Friday, with hx of LVAD HM II, on warfarin PTA. Warfarin regimen is 2 mg daily (last dose on 7/7). Pharmacy consulted to hold warfarin and start heparin when INR<1.8.  Heparin level is low at 0.11, INR stable is 1.8. Hemoglobin slightly down at 8.9.  No bleeding issues noted.   Goal of Therapy:  Heparin level 0.3-0.5 units/ml INR 2-2.5  Monitor platelets by anticoagulation protocol: Yes   Plan: -Increase heparin to 1150 units/hr -Recheck heparin level in 8 hours  Erin Hearing PharmD., BCPS Clinical Pharmacist 05/15/2018 8:23 AM

## 2018-05-15 NOTE — Progress Notes (Addendum)
[place] for heparin Indication: LVAD  Allergies  Allergen Reactions  . Demerol [Meperidine] Other (See Comments)    Paralysis/ Could only move eyes    Patient Measurements: Ht: 6' Wt: 78.6 kg Heparin Dosing Weight: 78.6 kg  Vital Signs: Temp: 98 F (36.7 C) (07/11 1737) Temp Source: Oral (07/11 1737) BP: 102/83 (07/11 1737) Pulse Rate: 67 (07/11 1737)  Labs: Recent Labs    05/14/18 1030 05/14/18 1831 05/15/18 0638 05/15/18 1622  HGB 9.3*  --  8.9*  --   HCT 31.9*  --  30.1*  --   PLT 203  --  207  --   LABPROT 22.1* 20.6* 21.1*  --   INR 1.95 1.79 1.84  --   HEPARINUNFRC  --   --  0.11* 0.24*  CREATININE 1.50*  --  1.54*  --     Estimated Creatinine Clearance: 43.4 mL/min (A) (by C-G formula based on SCr of 1.54 mg/dL (H)).  Assessment: 39 yom presenting for planned bladder tumor resection on Friday, with hx of LVAD HM II, on warfarin PTA. Warfarin regimen is 2 mg daily (last dose on 7/7). Pharmacy consulted to hold warfarin and start heparin when INR<1.8. -heparin level up to 0.24 on 1150 units/hr   Goal of Therapy:  Heparin level 0.3-0.5 units/ml INR 2-2.5  Monitor platelets by anticoagulation protocol: Yes   Plan:  -Increase heparin to 1300 units/hr -Recheck heparin level in 8 hours  Hildred Laser, PharmD Clinical Pharmacist Please check Amion for pharmacy contact number  05/15/2018 5:42 PM

## 2018-05-15 NOTE — Progress Notes (Signed)
LVAD Coordinator Rounding Note:  Admitted 11/29/17 due for heparin bridge in prep for bladder tumor resection on 05/16/18.[name]   HM II LVAD implanted on 10/19/13 by Dr. Cyndia Bent with oversewn aortic valveunder DT criteria.   Vital signs: Temp: 97.6 HR:62 Doppler Pressure:102 Automatic BP:117/94 (102) O2 Sat: 98% on RA   Weight: 173lbs  GTTS: Heparin1000 units/hr  LVAD interrogation reveals:  Speed: 8800 Flow:3.5 Power: 4.3w PI: 4.3 Alarms: none Events:none  Fixed speed: 8800 Low speed limit: 8200  Drive Line: Sorbaview dressing dry and intact; anchor intact and accurately applied. Weekly dressing changes.Next change is due 05/18/18. May be changed by [facility] or bedside nurse.  Labs:  LDH trend: 237  INR trend: 1.84  Creat trend: 1.54  Anticoagulation Plan: - INR Goal: 2.0 - 2.5 - ASA Dose: no ASA (removed 02/2017 due to GI bleed)  Device: -Dual Medtronic ICD -Therapies: on at 200 bpm - Last device check 10/02/17  Significant Events on VAD Support:  09/01/14>>ureteral stone with cystoscopy and flexible ureteroscopy with lithotripsy and basket extraction 02/2015 >>GIB with 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 Started on Plavix 3/17>>Lumbar fusion L3-L5 9/17>>VT with DC-CV; amio increased 10/17>>ramp with RV failure; speed decreased to 8800 02/2017> GIB (removed ASA, scopes negative) 03/2017>percutaneous lead repair after pump stops  10/18>>Slow VT. Cardioverted in ED and amiodarone uptitrated 12/18>>melena with scope. Hgb 6.5. EGD with 2 AVMs with APC 12/18>>recurrent hematuria. 6 mm left ureteral stone 1/19>>7 mm left ureteral stone removal with laser lithotripsy; left ureteral stent. Resection of 6 cm bladder tumor   Plan/Recommendations:   1. Please call VAD pager if any  issues with VAD equipment or questions, concerns. 2. VAD coordinator will accompany pt to the OR tomorrow afternoon.  Tanda Rockers RN, VAD Coordinator 24/7 pager [phone]

## 2018-05-15 NOTE — Progress Notes (Addendum)
Advanced Heart Failure VAD Team Note  PCP-Cardiologist: No primary care provider on file.   Subjective:    Creatinine stable 1.5, K 3.9  INR 1.84. LDH stable 237. Hemoglobin 9.3 > 8.9. Started on heparin drip last night.   Feels fine this morning. Denies CP or SOB. Denies bleeding.   LVAD INTERROGATION:  HeartMate 2 LVAD:   Flow 3.5 liters/min, speed 8800, power 4.3, PI 8.0.  No PI events/24 hours.   Objective:    Vital Signs:   Temp:  [97.6 F (36.4 C)-98.3 F (36.8 C)] 97.6 F (36.4 C) (07/11 0741) Pulse Rate:  [60-81] 60 (07/11 0741) Resp:  [15-17] 15 (07/11 0741) BP: (93-117)/(81-94) 117/94 (07/11 0741) SpO2:  [91 %-98 %] 98 % (07/11 0741) Weight:  [173 lb 4.5 oz (78.6 kg)-173 lb 15.1 oz (78.9 kg)] 173 lb 15.1 oz (78.9 kg) (07/11 0612) Last BM Date: 05/14/18 Mean arterial Pressure 90-240X modified systolic  Intake/Output:   Intake/Output Summary (Last 24 hours) at 05/15/2018 0754 Last data filed at 05/15/2018 7353 Gross per 24 hour  Intake 438.52 ml  Output 525 ml  Net -86.48 ml     Physical Exam    General:  Well appearing. No resp difficulty HEENT: normal Neck: supple. JVP 5-6. Carotids 2+ bilat; no bruits. No lymphadenopathy or thyromegaly appreciated. Cor: Mechanical heart sounds with LVAD hum present. Lungs: clear Abdomen: soft, nontender, nondistended. No hepatosplenomegaly. No bruits or masses. Good bowel sounds. Driveline: C/D/I; securement device intact and driveline incorporated Extremities: no cyanosis, clubbing, rash, edema Neuro: alert & orientedx3, cranial nerves grossly intact. moves all 4 extremities w/o difficulty. Affect pleasant   Telemetry   Apaced 60. Personally reviewed.   EKG    No new tracings.   Labs   Basic Metabolic Panel: Recent Labs  Lab 05/14/18 1030  NA 137  K 4.1  CL 107  CO2 24  GLUCOSE 118*  BUN 36*  CREATININE 1.50*  CALCIUM 9.2  MG 2.2    Liver Function Tests: Recent Labs  Lab 05/14/18 1030    AST 24  ALT 14  ALKPHOS 140*  BILITOT 0.7  PROT 7.6  ALBUMIN 3.5   No results for input(s): LIPASE, AMYLASE in the last 168 hours. No results for input(s): AMMONIA in the last 168 hours.  CBC: Recent Labs  Lab 05/14/18 1030 05/15/18 0638  WBC 5.3 5.4  NEUTROABS 3.8  --   HGB 9.3* 8.9*  HCT 31.9* 30.1*  MCV 82.4 82.5  PLT 203 207    INR: Recent Labs  Lab 05/12/18 05/14/18 1030 05/14/18 1831 05/15/18 0638  INR 2.6 1.95 1.79 1.84    Other results:  EKG:    Imaging    No results found.   Medications:     Scheduled Medications: . amiodarone  200 mg Oral Daily  . atorvastatin  80 mg Oral q1800  . feeding supplement (ENSURE ENLIVE)  237 mL Oral BID BM  . levothyroxine  150 mcg Oral QAC breakfast  . multivitamin with minerals  1 tablet Oral Daily  . pantoprazole  40 mg Oral BID  . ranolazine  500 mg Oral BID  . sildenafil  20 mg Oral TID     Infusions: . heparin 1,000 Units/hr (05/15/18 2992)     PRN Medications:  acetaminophen, docusate sodium, ondansetron (ZOFRAN) IV, promethazine, temazepam   Patient Profile   [name] is a 77 y.o. 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 VAD implantation was complicated by VT, syncope, AF/Aflutter and RHF. Required short term milrinone.  Admitted for optimization and heparin bridge prior to bladder surgery on Friday.   Assessment/Plan   1. Chronic systolic HF: s/p HM-II LVAD 10/2014.  - Recent RHC in 1/19 with mildly low output. Started on milrinone without benefit so it was stopped.  -Volume status stable. Takes PRN lasix at home. -VAD interrogated personally. Parameters stable.No change.  - s/p previous driveline repair 11/5398. No further pump stops 2.VT - underwent DC-CV 9/17, 08/17/17, and 04/01/18 -Continue amiodarone 200 mg daily  - Keep K > 4.0 Mg > 2.0. K  3.9, Mag 2.2 this am.  3. 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  - Had 2 BCG infusions with Dr. Gloriann Loan but discontinued due to inability to tolerate - Now with recurrent tumor and plans for tumor resection with Dr Gloriann Loan on Friday.  - Now on heparin drip per pharmacy. INR 1.84 this morning.  - I sent Dr Gloriann Loan an inbasket message to make sure he is aware that Mr Meditz is here. 4. CKD, stage III:  - Had ESRD in setting of obstructive uropathy and ATN. Required HD x1 (11/2017) - Monitor daily BMET. Creatinine stable 1.54 5. 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 Continue coumadin and plavix.(with h/o CVA). - Admitted 4/18 with recurrent GIB. Transfused 2u. EGD/colon negative - Admitted 12./18. hgb 6.5, Transfused 5u. EGD with 2 AVMs treated with APC - Had been getting octreotide infusions but too expensive for him. Willcontinue to hold for now - Hemoglobin 9.3 > 8.9. INR 1.84. Now on heparin drip. Denies bleeding.  6. Atrial arrhythmias: Atrial fibrillation, atrial tachycardia. S/p DC-CV 06/04/14, DC-CV 03/02/15 and 01/19/16.  - Remains in NSR - Continue amiodarone 200 mg daily 7. HTN:  -Modified systolic 86-761P 8. LVAD:  - VAD interrogated personally. Parameters stable. - [place]. 9. Anticoagulation management:  - Goal INR 2-2.5 - Now on heparin per pharmacy. INR 1.84 10. Lumbar compression fracture: s/p L3-L5 fusion 02/03/16. - Resolved 11. 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  - Continue atorvastatin. (and warfarin INR goal 2-2.5). S/p R CEA 01/13/16. Plavix stopped 12/18 due to recurrent GIB. No change. 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 Forde Dandy in past.  - Amio restarted due to recurrent atrial arrhythmias and VT.  - Recent FT4 elevated. Synthroid cut to 150 daily in 12/18.  - TSH elevated 10.125, but T4 normal 1.05. 13. Insomnia - Continue  temazepam 30 qhs PRN. No change.   I reviewed the LVAD parameters from today, and compared the results to the patient's prior recorded data.  No programming changes were made.  The LVAD is functioning within specified parameters.  The patient performs LVAD self-test daily.  LVAD interrogation was negative for any significant power changes, alarms or PI events/speed drops.  LVAD equipment check completed and is in good working order.  Back-up equipment present.   LVAD education done on emergency procedures and precautions and reviewed exit site care.  Length of Stay: [place], NP 05/15/2018, 7:54 AM  VAD Team --- VAD ISSUES ONLY--- Pager [phone] (7am - 7am)  Advanced Heart Failure Team  Pager [phone] (M-F; 7a - 4p)  Please contact [facility] for night-coverage after hours (4p -7a ) and  weekends on [email or web address]  Patient seen with NP, agree with the above note.  INR 1.8 today, now on heparin gtt.  No complaints, LVAD parameters stable.  He is scheduled for bladder tumor resection tomorrow.  Will restart anticoagulation after procedure when surgeon determines that it would be safe.   Loralie Champagne 05/15/2018 3:46 PM

## 2018-05-16 ENCOUNTER — Inpatient Hospital Stay (HOSPITAL_COMMUNITY): Payer: Medicare Other | Admitting: Anesthesiology

## 2018-05-16 ENCOUNTER — Inpatient Hospital Stay (HOSPITAL_COMMUNITY): Payer: Medicare Other

## 2018-05-16 ENCOUNTER — Encounter (HOSPITAL_COMMUNITY): Payer: Self-pay

## 2018-05-16 ENCOUNTER — Encounter (HOSPITAL_COMMUNITY)
Admission: RE | Disposition: A | Discharge status: Home / Self Care | Payer: Self-pay | Source: Home / Self Care | Attending: Cardiology

## 2018-05-16 HISTORY — PX: CYSTOSCOPY W/ RETROGRADES: SHX1426

## 2018-05-16 HISTORY — PX: TRANSURETHRAL RESECTION OF BLADDER TUMOR: SHX2575

## 2018-05-16 LAB — BASIC METABOLIC PANEL
ANION GAP: 9 (ref 5–15)
BUN: 29 mg/dL — AB (ref 8–23)
CO2: 25 mmol/L (ref 22–32)
Calcium: 8.9 mg/dL (ref 8.9–10.3)
Chloride: 104 mmol/L (ref 98–111)
Creatinine, Ser: 1.61 mg/dL — ABNORMAL HIGH (ref 0.61–1.24)
GFR calc Af Amer: 46 mL/min — ABNORMAL LOW (ref >60)
GFR calc non Af Amer: 39 mL/min — ABNORMAL LOW (ref >60)
GLUCOSE: 105 mg/dL — AB (ref 70–99)
POTASSIUM: 4.3 mmol/L (ref 3.5–5.1)
Sodium: 138 mmol/L (ref 135–145)

## 2018-05-16 LAB — CBC
HEMATOCRIT: 28.5 % — AB (ref 39.0–52.0)
Hemoglobin: 8.6 g/dL — ABNORMAL LOW (ref 13.0–17.0)
MCH: 24.6 pg — ABNORMAL LOW (ref 26.0–34.0)
MCHC: 30.2 g/dL (ref 30.0–36.0)
MCV: 81.4 fL (ref 78.0–100.0)
Platelets: 206 10*3/uL (ref 150–400)
RBC: 3.5 MIL/uL — ABNORMAL LOW (ref 4.22–5.81)
RDW: 22.6 % — AB (ref 11.5–15.5)
WBC: 5.4 10*3/uL (ref 4.0–10.5)

## 2018-05-16 LAB — PROTIME-INR
INR: 1.71
Prothrombin Time: 19.9 seconds — ABNORMAL HIGH (ref 11.4–15.2)

## 2018-05-16 LAB — LACTATE DEHYDROGENASE: LDH: 226 U/L — ABNORMAL HIGH (ref 98–192)

## 2018-05-16 LAB — MAGNESIUM: Magnesium: 2.1 mg/dL (ref 1.7–2.4)

## 2018-05-16 LAB — HEPARIN LEVEL (UNFRACTIONATED): Heparin Unfractionated: 0.32 IU/mL (ref 0.30–0.70)

## 2018-05-16 SURGERY — TURBT (TRANSURETHRAL RESECTION OF BLADDER TUMOR)
Anesthesia: General | Site: Bladder

## 2018-05-16 MED ORDER — SUGAMMADEX SODIUM 200 MG/2ML IV SOLN
INTRAVENOUS | Status: DC | PRN
  Administered 2018-05-16: 160 mg via INTRAVENOUS

## 2018-05-16 MED ORDER — NOREPINEPHRINE 4 MG/250ML-% IV SOLN
0.0000 ug/min | INTRAVENOUS | Status: AC
  Administered 2018-05-16: 4 ug/min via INTRAVENOUS
  Administered 2018-05-16: 2 ug/min via INTRAVENOUS
  Filled 2018-05-16: qty 250

## 2018-05-16 MED ORDER — EPHEDRINE SULFATE 50 MG/ML IJ SOLN
INTRAMUSCULAR | Status: DC | PRN
  Administered 2018-05-16: 10 mg via INTRAVENOUS
  Administered 2018-05-16: 5 mg via INTRAVENOUS

## 2018-05-16 MED ORDER — IOPAMIDOL (ISOVUE-300) INJECTION 61%
INTRAVENOUS | Status: AC
  Filled 2018-05-16: qty 50

## 2018-05-16 MED ORDER — LIDOCAINE HCL URETHRAL/MUCOSAL 2 % EX GEL
CUTANEOUS | Status: AC
  Filled 2018-05-16: qty 20

## 2018-05-16 MED ORDER — ONDANSETRON HCL 4 MG/2ML IJ SOLN
INTRAMUSCULAR | Status: AC
  Filled 2018-05-16: qty 2

## 2018-05-16 MED ORDER — PROMETHAZINE HCL 25 MG/ML IJ SOLN
6.2500 mg | INTRAMUSCULAR | Status: DC | PRN
  Administered 2018-05-16: 6.25 mg via INTRAVENOUS

## 2018-05-16 MED ORDER — LIDOCAINE HCL (CARDIAC) PF 100 MG/5ML IV SOSY
PREFILLED_SYRINGE | INTRAVENOUS | Status: DC | PRN
  Administered 2018-05-16: 80 mg via INTRAVENOUS

## 2018-05-16 MED ORDER — WARFARIN SODIUM 4 MG PO TABS
4.0000 mg | ORAL_TABLET | Freq: Once | ORAL | Status: AC
  Administered 2018-05-16: 4 mg via ORAL
  Filled 2018-05-16 (×2): qty 1

## 2018-05-16 MED ORDER — FENTANYL CITRATE (PF) 250 MCG/5ML IJ SOLN
INTRAMUSCULAR | Status: AC
  Filled 2018-05-16: qty 5

## 2018-05-16 MED ORDER — SODIUM CHLORIDE 0.9 % IV SOLN
INTRAVENOUS | Status: DC | PRN
  Administered 2018-05-16: 15:00:00

## 2018-05-16 MED ORDER — SODIUM CHLORIDE 0.9 % IJ SOLN
INTRAMUSCULAR | Status: AC
  Filled 2018-05-16: qty 10

## 2018-05-16 MED ORDER — FENTANYL CITRATE (PF) 100 MCG/2ML IJ SOLN: 25.0000 ug | INTRAMUSCULAR | Status: DC | PRN

## 2018-05-16 MED ORDER — HEPARIN (PORCINE) IN NACL 100-0.45 UNIT/ML-% IJ SOLN
1300.0000 [IU]/h | INTRAMUSCULAR | Status: DC
  Administered 2018-05-16 – 2018-05-17 (×3): 1300 [IU]/h via INTRAVENOUS
  Filled 2018-05-16 (×3): qty 250

## 2018-05-16 MED ORDER — ROCURONIUM BROMIDE 100 MG/10ML IV SOLN
INTRAVENOUS | Status: DC | PRN
  Administered 2018-05-16: 30 mg via INTRAVENOUS
  Administered 2018-05-16: 10 mg via INTRAVENOUS

## 2018-05-16 MED ORDER — SODIUM CHLORIDE 0.9 % IV SOLN
INTRAVENOUS | Status: DC | PRN
  Administered 2018-05-16: 25 ug/min via INTRAVENOUS

## 2018-05-16 MED ORDER — SUGAMMADEX SODIUM 200 MG/2ML IV SOLN
INTRAVENOUS | Status: AC
  Filled 2018-05-16: qty 2

## 2018-05-16 MED ORDER — SUCCINYLCHOLINE CHLORIDE 20 MG/ML IJ SOLN
INTRAMUSCULAR | Status: DC | PRN
  Administered 2018-05-16: 100 mg via INTRAVENOUS

## 2018-05-16 MED ORDER — PHENYLEPHRINE HCL 10 MG/ML IJ SOLN
INTRAMUSCULAR | Status: DC | PRN
  Administered 2018-05-16: 40 ug via INTRAVENOUS
  Administered 2018-05-16: 120 ug via INTRAVENOUS
  Administered 2018-05-16: 40 ug via INTRAVENOUS
  Administered 2018-05-16: 80 ug via INTRAVENOUS
  Administered 2018-05-16: 120 ug via INTRAVENOUS

## 2018-05-16 MED ORDER — EPINEPHRINE PF 1 MG/10ML IJ SOSY
PREFILLED_SYRINGE | INTRAMUSCULAR | Status: AC
  Filled 2018-05-16: qty 10

## 2018-05-16 MED ORDER — ONDANSETRON HCL 4 MG/2ML IJ SOLN
INTRAMUSCULAR | Status: DC | PRN
  Administered 2018-05-16: 4 mg via INTRAVENOUS

## 2018-05-16 MED ORDER — STERILE WATER FOR IRRIGATION IR SOLN
Status: DC | PRN
  Administered 2018-05-16: 1000 mL

## 2018-05-16 MED ORDER — LIDOCAINE 2% (20 MG/ML) 5 ML SYRINGE
INTRAMUSCULAR | Status: AC
  Filled 2018-05-16: qty 5

## 2018-05-16 MED ORDER — CEFAZOLIN SODIUM-DEXTROSE 2-3 GM-%(50ML) IV SOLR
INTRAVENOUS | Status: DC | PRN
  Administered 2018-05-16: 2 g via INTRAVENOUS

## 2018-05-16 MED ORDER — MIDAZOLAM HCL 2 MG/2ML IJ SOLN
INTRAMUSCULAR | Status: AC
  Filled 2018-05-16: qty 2

## 2018-05-16 MED ORDER — FENTANYL CITRATE (PF) 100 MCG/2ML IJ SOLN
INTRAMUSCULAR | Status: DC | PRN
  Administered 2018-05-16 (×2): 25 ug via INTRAVENOUS

## 2018-05-16 MED ORDER — ROCURONIUM BROMIDE 10 MG/ML (PF) SYRINGE
PREFILLED_SYRINGE | INTRAVENOUS | Status: AC
  Filled 2018-05-16: qty 10

## 2018-05-16 MED ORDER — WARFARIN - PHARMACIST DOSING INPATIENT
Freq: Every day | Status: DC
  Administered 2018-05-16 – 2018-05-20 (×3)

## 2018-05-16 MED ORDER — PROMETHAZINE HCL 25 MG/ML IJ SOLN
INTRAMUSCULAR | Status: AC
  Filled 2018-05-16: qty 1

## 2018-05-16 MED ORDER — SODIUM CHLORIDE 0.9 % IR SOLN
Status: DC | PRN
  Administered 2018-05-16 (×3): 1000 mL

## 2018-05-16 MED ORDER — SUCCINYLCHOLINE CHLORIDE 200 MG/10ML IV SOSY
PREFILLED_SYRINGE | INTRAVENOUS | Status: AC
  Filled 2018-05-16: qty 10

## 2018-05-16 MED ORDER — LACTATED RINGERS IV SOLN
INTRAVENOUS | Status: DC
  Administered 2018-05-16 (×2): via INTRAVENOUS

## 2018-05-16 MED ORDER — ETOMIDATE 2 MG/ML IV SOLN
INTRAVENOUS | Status: DC | PRN
  Administered 2018-05-16: 16 mg via INTRAVENOUS

## 2018-05-16 SURGICAL SUPPLY — 38 items
ADAPTER CATH URET PLST 4-6FR (CATHETERS) IMPLANT
ADPR CATH URET STRL DISP 4-6FR (CATHETERS)
APL SKNCLS STERI-STRIP NONHPOA (GAUZE/BANDAGES/DRESSINGS)
BAG URINE DRAINAGE (UROLOGICAL SUPPLIES) ×3 IMPLANT
BAG URO CATCHER STRL LF (MISCELLANEOUS) ×3 IMPLANT
BENZOIN TINCTURE PRP APPL 2/3 (GAUZE/BANDAGES/DRESSINGS) IMPLANT
BLADE 10 SAFETY STRL DISP (BLADE) ×3 IMPLANT
BUCKET BIOHAZARD WASTE 5 GAL (MISCELLANEOUS) ×3 IMPLANT
CATH FOLEY 2WAY SLVR  5CC 16FR (CATHETERS)
CATH FOLEY 2WAY SLVR 5CC 16FR (CATHETERS) IMPLANT
CATH URET 5FR 28IN CONE TIP (BALLOONS) ×1
CATH URET 5FR 70CM CONE TIP (BALLOONS) ×2 IMPLANT
CONT SPEC 4OZ CLIKSEAL STRL BL (MISCELLANEOUS) ×3 IMPLANT
COVER SURGICAL LIGHT HANDLE (MISCELLANEOUS) ×3 IMPLANT
Contour 6 f ureteral stent ×3 IMPLANT
DRAPE CAMERA CLOSED 9X96 (DRAPES) ×6 IMPLANT
DRSG TELFA 3X8 NADH (GAUZE/BANDAGES/DRESSINGS) ×3 IMPLANT
GOWN STRL REUS W/ TWL LRG LVL3 (GOWN DISPOSABLE) ×2 IMPLANT
GOWN STRL REUS W/ TWL XL LVL3 (GOWN DISPOSABLE) ×2 IMPLANT
GOWN STRL REUS W/TWL LRG LVL3 (GOWN DISPOSABLE) ×2
GOWN STRL REUS W/TWL XL LVL3 (GOWN DISPOSABLE) ×3
GUIDEWIRE COOK  .035 (WIRE) IMPLANT
GUIDEWIRE STR DUAL SENSOR (WIRE) ×6 IMPLANT
KIT TURNOVER KIT B (KITS) ×3 IMPLANT
LOOP CUT BIPOLAR 24F LRG (ELECTROSURGICAL) ×6 IMPLANT
NS IRRIG 1000ML POUR BTL (IV SOLUTION) ×6 IMPLANT
PACK CYSTO (CUSTOM PROCEDURE TRAY) ×3 IMPLANT
PAD ARMBOARD 7.5X6 YLW CONV (MISCELLANEOUS) ×6 IMPLANT
PLUG CATH AND CAP STER (CATHETERS) IMPLANT
SHEATH URETERAL FLEX 12X35 (SHEATH) ×3 IMPLANT
STENT URET 6FRX24 CONTOUR (STENTS) IMPLANT
STENT URET 6FRX26 CONTOUR (STENTS) ×3 IMPLANT
SYR 20ML ECCENTRIC (SYRINGE) ×3 IMPLANT
SYRINGE CONTROL L 12CC (SYRINGE) ×3 IMPLANT
SYRINGE TOOMEY DISP (SYRINGE) IMPLANT
UNDERPAD 30X30 (UNDERPADS AND DIAPERS) ×3 IMPLANT
WATER STERILE IRR 1000ML POUR (IV SOLUTION) ×3 IMPLANT
WIRE COONS/BENSON .038X145CM (WIRE) IMPLANT

## 2018-05-16 NOTE — Progress Notes (Signed)
[place] for heparin Indication: LVAD  Allergies  Allergen Reactions  . Demerol [Meperidine] Other (See Comments)    Paralysis/ Could only move eyes    Patient Measurements: Ht: 6' Wt: 78.6 kg Heparin Dosing Weight: 78.6 kg  Vital Signs: Temp: 97.8 F (36.6 C) (07/12 0749) Temp Source: Oral (07/12 0749) BP: 107/88 (07/12 0749) Pulse Rate: 62 (07/12 0320)  Labs: Recent Labs    05/14/18 1030 05/14/18 1831 05/15/18 0638 05/15/18 1622 05/16/18 0603  HGB 9.3*  --  8.9*  --  8.6*  HCT 31.9*  --  30.1*  --  28.5*  PLT 203  --  207  --  206  LABPROT 22.1* 20.6* 21.1*  --  19.9*  INR 1.95 1.79 1.84  --  1.71  HEPARINUNFRC  --   --  0.11* 0.24* 0.32  CREATININE 1.50*  --  1.54*  --  1.61*    Estimated Creatinine Clearance: 41.5 mL/min (A) (by C-G formula based on SCr of 1.61 mg/dL (H)).  Assessment: [name] presenting for planned bladder tumor resection on Friday, with hx of LVAD HM II, on warfarin PTA. Warfarin regimen is 2 mg daily (last dose on 7/7).  Patient started on heparin bridge 7/10. Resection of bladder tumor this am. No complications noted.   Discussed with HF-NP will restart heparin and warfarin tonight  Heparin level was at low end of goal this am on 1300 units/hr, will resume this rate. INR has only slightly trended down to 1.7, last dose was Sunday night 7/7.   Goal of Therapy:  Heparin level 0.3-0.5 units/ml INR 2-2.5  Monitor platelets by anticoagulation protocol: Yes   Plan:  -Restart heparin at 1300 units/hr -Recheck heparin level in am -Warfarin 4mg  tonight - likely resume home dose this weekend  Erin Hearing PharmD., BCPS Clinical Pharmacist 05/16/2018 9:59 AM

## 2018-05-16 NOTE — Progress Notes (Signed)
LVAD Coordinator Rounding Note:  Admitted 05/14/18 due for heparin bridge in prep for bladder tumor resection on 05/16/18.[name]   HM II LVAD implanted on 10/19/13 by Dr. Cyndia Bent with oversewn aortic valveunder DT criteria.   Vital signs: Temp: 97.8 HR:60 Doppler Pressure:98 Automatic BP:107/88 (95) O2 Sat: 93% on RA   Weight: 173lbs  GTTS: Heparin1000 units/hr - off at 0800 per Dr. Gloriann Loan  LVAD interrogation reveals:  Speed: 8800 Flow:3.5 Power: 4.3w PI: 8.0 Alarms: none Events:none  Fixed speed: 8800 Low speed limit: 8200  Drive Line: Sorbaview dressing dry and intact; anchor intact and accurately applied. Weekly dressing changes.Next change is due 05/18/18. May be changed by [facility] or bedside nurse.  Labs:  LDH trend: 237>226  INR trend: 1.84>1.71  Creat trend: 1.54>1.61  Anticoagulation Plan: - INR Goal: 2.0 - 2.5 - ASA Dose: no ASA (removed 02/2017 due to GI bleed)  Device: -Dual Medtronic ICD -Therapies: on at 200 bpm - Last device check 10/02/17  Significant Events on VAD Support:  09/01/14>>ureteral stone with cystoscopy and flexible ureteroscopy with lithotripsy and basket extraction 02/2015 >>GIB with 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 Started on Plavix 3/17>>Lumbar fusion L3-L5 9/17>>VT with DC-CV; amio increased 10/17>>ramp with RV failure; speed decreased to 8800 02/2017> GIB (removed ASA, scopes negative) 03/2017>percutaneous lead repair after pump stops  10/18>>Slow VT. Cardioverted in ED and amiodarone uptitrated 12/18>>melena with scope. Hgb 6.5. EGD with 2 AVMs with APC 12/18>>recurrent hematuria. 6 mm left ureteral stone 1/19>>7 mm left ureteral stone removal with laser lithotripsy; left ureteral stent. Resection of 6 cm bladder tumor   Plan/Recommendations:    1. Please call VAD pager if any issues with VAD equipment or questions, concerns. 2. VAD coordinator will accompany pt to the OR today.  Tanda Rockers RN, VAD Coordinator 24/7 pager [phone]

## 2018-05-16 NOTE — Anesthesia Procedure Notes (Signed)
Procedure Name: Intubation Date/Time: 05/16/2018 1:28 PM Performed by: Glynda Jaeger, CRNA Pre-anesthesia Checklist: Patient identified, Emergency Drugs available, Suction available and Patient being monitored Patient Re-evaluated:Patient Re-evaluated prior to induction Oxygen Delivery Method: Circle System Utilized Preoxygenation: Pre-oxygenation with 100% oxygen Induction Type: IV induction Ventilation: Mask ventilation without difficulty Laryngoscope Size: Miller and 2 Grade View: Grade I Tube type: Oral Tube size: 8.0 mm Number of attempts: 1 Airway Equipment and Method: Stylet and Oral airway Placement Confirmation: ETT inserted through vocal cords under direct vision,  positive ETCO2 and breath sounds checked- equal and bilateral Secured at: 24 cm Tube secured with: Tape Dental Injury: Teeth and Oropharynx as per pre-operative assessment

## 2018-05-16 NOTE — Op Note (Signed)
Operative Note  Preoperative diagnosis:  1.  Bladder cancer  Postoperative diagnosis: 1.  Bladder cancer 2.  Right hydronephrosis  Procedure(s): 1.  Urethral dilation 2.  Transurethral resection of bladder tumor--medium 3.  Bilateral retrograde pyelogram.  Right diagnostic ureteroscopy with right ureteral stent placement  Surgeon: Link Snuffer, MD  Assistants: None  Anesthesia: General  Complications: None immediate  EBL: 20 cc  Specimens: 1.  Bladder tumor  Drains/Catheters: 1.  6 x 26 double-J ureteral stent on a string  Intraoperative findings: 1.  Normal urethra with a moderate sized prostate mildly obstructing with no significant median lobe. 2.  Multiple papillary bladder tumors throughout the bladder, the largest being approximately 2 cm.  All tumors were completely resected and fulgurated. 3.  Left retrograde pyelogram revealed no hydronephrosis.  There was no evidence of obvious filling defect to suggest tumor. 4.  Right retrograde pyelogram revealed hydroureteronephrosis down to the level of the bladder. 5.  Right diagnostic ureteroscopy revealed some inflammation/inflammatory change at the distal ureter.  Of note, this was the site of prior resection.  The the prior large mass overlied the right ureteral orifice.  There was not an obvious bladder tumor here.  There was not an obvious stricture.  In the proximal ureter, there was a transition point.  Ureteroscopy revealed some tortuosity of the ureter but there was no obvious stricture.  There were no tumors in the kidney or proximal ureter as well.  There was no obvious significant obstruction that I could see that would explain the hydronephrosis.  Indication: 77 year old male with a history of pT1a bladder cancer status post TURBT-large.  On surveillance cystoscopy, he was found to have a bladder tumor recurrence.  He presents for the previously mentioned operation.  Of note, he has a LVAD.  He was transitioned from  warfarin to heparin.  Heparin was turned off about 4 hours ago.  Description of procedure:  The patient was identified and consent was obtained.  The patient was taken to the operating room and placed in the supine position.  The patient was placed under general anesthesia.  Perioperative antibiotics were administered.  The patient was placed in dorsal lithotomy.  Patient was prepped and draped in a standard sterile fashion and a timeout was performed.  Start, the urethra was sequentially dilated from 4 [place] to 30 [place] carefully with sounds.  A 26 French resectoscope with the visual obturator in place was then easily passed into the urethra and into the bladder.  Complete cystoscopy was performed and all bladder tumors were identified and subsequently resected on bipolar setting and fulgurated.  All bladder tumor was removed that was visual.  Collected the bladder tumor specimen for permanent.  I then exchanged the scope for a 23 French rigid cystoscope and advanced that into the bladder.  I again performed a complete cystoscopy and no other tumors were seen.  I used a cone-tip catheter to shoot retrograde pyelogram on the left with no abnormal findings.  I then performed the same retrograde pyelogram on the right and this revealed significant moderate to severe hydroureteronephrosis down to the level of the bladder.  I therefore decided to perform diagnostic ureteroscopy to see if there was a tumor that had developed.  I advanced a wire up the right ureter and into the kidney under fluoroscopic guidance.  I then advanced a semirigid ureteroscope alongside this.  There was some inflammation at the ureteral orifice consistent with prior transurethral resection but the scope easily passed  through and there was no obvious stricture.  Was no obvious tumor in the distal ureter.  After inspecting with the semirigid ureteroscope, this confirmed hydroureteronephrosis down to the level of the bladder.  I advanced  a second wire through the scope and remove the semirigid ureteroscope.  I then advanced a 12 x 14 ureteral access sheath over the wire under continuous fluoroscopic guidance.  I removed the inner sheath along with the wire.  I then performed flexible ureteroscopy.  There was significant tortuosity at the proximal ureter and I had to pass a second wire through the digital ureteroscope and into the kidney in order to navigate the scope into the kidney.  I then inspected the entire kidney and there was no abnormal findings.  I remove the wire and carefully withdrew the scope along with the access sheath.  Again there was some tortuosity that was seen at the proximal ureter but no obvious stricture.  Upon removal of the scope and sheath, there was no significant trauma noted and there was no obvious stone, stricture, or tumor.  The abnormality was some inflammation in the distal ureter but no obvious mass.  I therefore advanced a 6 x 26 double-J ureteral stent over the wire under fluoroscopic guidance and remove the wire.  Fluoroscopy confirmed a good coil within the bladder and a good coil in the kidney.  I left a string on the stent.  This concluded the operation.  The patient tolerated the procedure well and was stable postoperatively.  Plan: The patient will be transitioned back over to warfarin.  He will be hospitalized several days for this.  I anticipate he will have some hematuria from the urethral dilation as well as the resection and ureteroscopy.  I plan to remove the stent on Monday or Tuesday.

## 2018-05-16 NOTE — Progress Notes (Signed)
VAD Coordinator Procedure Note:   Patient underwent transurethral resection of bladder tumor/uretoroscopy. Hemodynamics and VAD parameters monitored by me and CRNA throughout the procedure. MAPs were obtained with automatic BP cuff on the right arm.      Auto cuff(MAP):       Flow:    PI:    Power:     Speed:      Time:          Pre-procedure:119/90(99)  3.5    8.0      4.3      8800    1210   Sedation Induction:126/101(110)       3.5    8.0      4.3                 8800   1323            111/95(102) 3.2      6.5        4.1      8800              1330             96/85(91)  3.5     5.5       4.3                  8800   1345             (82)     3.6     5.2       4.4        8800   1400              (80)  3.8     4.9       4.4       8800   1415            (78)-doppler 3.8     5.0       4.5       8800   1430           (92)  3.5     5.9       4.3       8800   1445           108/97(102) 3.2     6.9       4.1       8800    1500        107/89(96)  3.2     7.0       4.2       8800    1515     Recovery area:108/85(95) 4.0     6.4       4.6      8800    1541   Patient tolerated the procedure well. PIs were  4.9-8.0 throughout the case with no power elevations. After adequate sedation was achieved, pulse ox 98% and maintained >92% throughout the remainder of the procedure. MAPs were >78.   Patient Disposition: Pt was transferred back to [facility] and handoff was given to [facility], Therapist, sports.  Tanda Rockers RN, BSN VAD Coordinator 24/7 Pager [phone]

## 2018-05-16 NOTE — Care Management Note (Signed)
Case Management Note Marvetta Gibbons RN, BSN Unit 4E-Case Manager-- Cross Coverage for [place] [phone]  Patient Details  Name: [name] MRN: [id number] Date of Birth: 1941/07/08  Subjective/Objective:   LVAD pt admitted for optimization and heparin bridge prior to bladder surgery planned for 05/16/18                Action/Plan: PTA Pt lived at home, anticipate return home. CM to follow post op for transition of care needs  Expected Discharge Date:  05/19/18               Expected Discharge Plan:  Home/Self Care  In-House Referral:     Discharge planning Services  CM Consult  Post Acute Care Choice:    Choice offered to:     DME Arranged:    DME Agency:     HH Arranged:    HH Agency:     Status of Service:  In process, will continue to follow  If discussed at Long Length of Stay Meetings, dates discussed:    Discharge Disposition:   Additional Comments:  Dawayne Patricia, RN 05/16/2018, 10:37 AM

## 2018-05-16 NOTE — Consult Note (Signed)
H&P Physician requesting consult: Dr Loralie Champagne, MD  Chief Complaint: Bladder tumor  History of Present Illness: 77 year old male with a history of PT 1 bladder cancer was found to recently have a recurrence on cystoscopy in the office.  He has a LVAD and therefore was admitted several days ago for transition to heparin and off warfarin.  He has no complaints.  No gross hematuria.  Past Medical History:  Diagnosis Date  . Arthritis   . Automatic implantable cardioverter-defibrillator in situ    a. s/p Medtronic Evera device serial [id number]    . Bradycardia   . CAD (coronary artery disease)    a. s/p MI 1982;  s/p DES to CFX 2011;  s/p NSTEMI 4/12 in setting of VTach;  cath 7/12:  LM ok, LAD mid 30-40%, Dx's with 40%, mCFX stent patent, prox to mid RCA occluded with R to R and L to R collats.  Medical Tx was continued  . Chronic systolic heart failure ([place])    a. s/p LVAD 10/2013 for destination therapy  . CKD (chronic kidney disease)   . CVD (cerebrovascular disease)    a. s/p L CEA 2008  . Cytopenia   . Dysrhythmia    AFIB  . History of GI bleed    a. on ASA- started on plavix during 10/2015 admission for CVA  . HLD (hyperlipidemia)   . HTN (hypertension)   . Hypothyroidism   . Inappropriate therapy from implantable cardioverter-defibrillator    a. ATP for sinus tach SVT wavelet identified SVT but was no  passive (2014)  . Ischemic cardiomyopathy   . Melanoma of ear ([place])    a. L Ear   . Myocardial infarction ([place])    1992  . PAF (paroxysmal atrial fibrillation) ([place])   . PVD (peripheral vascular disease) ([place])    a. 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 Shortness of breath dyspnea    W/ EXERTION   . Sleep apnea    NO CPAP NOW  HE SENT IT BACK YRS AGO  . Stroke [facility])    a. 10/2015 admission   . Ventricular tachycardia ([place])    a. s/p AICD;   h/o ICD shock;  Amiodarone  Rx. S/P ablation in 2012   Past Surgical History:  Procedure Laterality Date  . BACK SURGERY    . CARDIAC CATHETERIZATION     "several" (05/25/2013)  . CARDIAC DEFIBRILLATOR PLACEMENT  2003; 2005; 05/25/2013   2014: Medtronic Evera device serial number [id number]  . CARDIAC ELECTROPHYSIOLOGY STUDY AND ABLATION  2012  . CARDIOVERSION N/A 10/15/2013   Procedure: CARDIOVERSION;  Surgeon: Jolaine Artist, MD;  Location: [place];  Service: Cardiovascular;  Laterality: N/A;  . CARDIOVERSION N/A 11/04/2013   Procedure: CARDIOVERSION;  Surgeon: Larey Dresser, MD;  Location: [place];  Service: Cardiovascular;  Laterality: N/A;  . CARDIOVERSION N/A 06/04/2014   Procedure: CARDIOVERSION;  Surgeon: Jolaine Artist, MD;  Location: [facility] ENDOSCOPY;  Service: Cardiovascular;  Laterality: N/A;  . CARDIOVERSION N/A 03/02/2015   Procedure: CARDIOVERSION;  Surgeon: Jolaine Artist, MD;  Location: [facility] ENDOSCOPY;  Service: Cardiovascular;  Laterality: N/A;  . CARDIOVERSION N/A 01/19/2016   Procedure: CARDIOVERSION;  Surgeon: Larey Dresser, MD;  Location: [place];  Service: Cardiovascular;  Laterality: N/A;  . CAROTID ENDARTERECTOMY Left ~ 2007  . CATARACT EXTRACTION W/  INTRAOCULAR LENS  IMPLANT, BILATERAL Bilateral 2000  . CHOLECYSTECTOMY  12/15/?2010  . COLONOSCOPY WITH PROPOFOL N/A 2020-09-2517   Procedure: COLONOSCOPY WITH PROPOFOL;  Surgeon: Ronald Lobo, MD;  Location: [place];  Service: Endoscopy;  Laterality: N/A;  . CORONARY ANGIOPLASTY  1992  . CORONARY ANGIOPLASTY WITH STENT PLACEMENT     "last one was 11/2012  (05/25/2013)  . CYSTOSCOPY WITH RETROGRADE PYELOGRAM, URETEROSCOPY AND STENT PLACEMENT Left 08/09/2014   Procedure: CYSTOSCOPY WITH RETROGRADE PYELOGRAM, URETEROSCOPY AND STENT PLACEMENT;  Surgeon: Bernestine Amass, MD;  Location[facility];  Service: Urology;  Laterality: Left;  . CYSTOSCOPY WITH RETROGRADE PYELOGRAM, URETEROSCOPY AND STENT PLACEMENT Left 09/01/2014   Procedure:  CYSTOSCOPY WITH URETEROSCOPY AND STENT REMOVAL;  Surgeon: Bernestine Amass, MD;  [facility];  Service: Urology;  Laterality: Left;  . CYSTOSCOPY WITH RETROGRADE PYELOGRAM, URETEROSCOPY AND STENT PLACEMENT Left 09/20/2014   Procedure: CYSTOSCOPY WITH RETROGRADE PYELOGRAM, URETEROSCOPY AND STENT PLACEMENT, stone removal;  Surgeon: Bernestine Amass, MD;  [facility];  Service: Urology;  Laterality: Left;  . CYSTOSCOPY WITH RETROGRADE PYELOGRAM, URETEROSCOPY AND STENT PLACEMENT Bilateral 12/04/2017   Procedure: CYSTOSCOPY WITH BILATERAL RETROGRADE PYELOGRAM, LEFT URETEROSCOPY AND STENT PLACEMENT;  Surgeon: Lucas Mallow, MD;  Location: [place];  Service: Urology;  Laterality: Bilateral;  . DOPPLER ECHOCARDIOGRAPHY  2011  . ELBOW ARTHROSCOPY Right 1990's  . ELECTROPHYSIOLOGIC STUDY N/A 07/19/2016   Procedure: Cardioversion;  Surgeon: Evans Lance, MD;  Location: [place] CV LAB;  Service: Cardiovascular;  Laterality: N/A;  . ENDARTERECTOMY Right 01/13/2016   Procedure: RIGHT CAROTID ARTERY ENDARTERECTOMY;  Surgeon: Mal Misty, MD;  Location: [place];  Service: Vascular;  Laterality: Right;  . ENTEROSCOPY Left 02/23/2017   Procedure: ENTEROSCOPY;  Surgeon: Ronald Lobo, MD;  Location: [facility] ENDOSCOPY;  Service: Endoscopy;  Laterality: Left;  . ENTEROSCOPY N/A 10/17/2017   Procedure: ENTEROSCOPY;  Surgeon: Yetta Flock, MD;  Location: [facility] ENDOSCOPY;  Service: Gastroenterology;  Laterality: N/A;  LVAD pt.    . ESOPHAGOGASTRODUODENOSCOPY N/A 02/15/2015   Procedure: ESOPHAGOGASTRODUODENOSCOPY (EGD);  Surgeon: Carol Ada, MD;  Location: [facility] ENDOSCOPY;  Service: Endoscopy;  Laterality: N/A;  LVAD  . EYE SURGERY     VITRECTOMY  LEFT 05/2012  . FEMORAL-POPLITEAL BYPASS GRAFT Right 2011  . FEMORAL-POPLITEAL BYPASS GRAFT Left 05/2002   Archie Endo 05/06/2002 (05/25/2013)  . HEEL SPUR SURGERY Right 1990's  . HOLMIUM LASER APPLICATION Left 37/08/6268   Procedure: HOLMIUM LASER APPLICATION;  Surgeon:  Bernestine Amass, MD;  [facility];  Service: Urology;  Laterality: Left;  . HOLMIUM LASER APPLICATION Left 4/85/4627   Procedure: HOLMIUM LASER APPLICATION;  Surgeon: Lucas Mallow, MD;  Location: [place];  Service: Urology;  Laterality: Left;  . IMPLANTABLE CARDIOVERTER DEFIBRILLATOR GENERATOR CHANGE N/A 05/25/2013   Procedure: IMPLANTABLE CARDIOVERTER DEFIBRILLATOR GENERATOR CHANGE;  Surgeon: Deboraha Sprang, MD;  Location: [facility] CATH LAB;  Service: Cardiovascular;  Laterality: N/A;  . INSERTION OF IMPLANTABLE LEFT VENTRICULAR ASSIST DEVICE N/A 10/19/2013   Procedure: INSERTION OF IMPLANTABLE LEFT VENTRICULAR ASSIST DEVICE;  Surgeon: Gaye Pollack, MD;  Location: [place];  Service: Open Heart Surgery;  Laterality: N/A;  CIRC ARREST  NITRIC OXIDE  MEDTRONIC ICD  . INTRAOPERATIVE TRANSESOPHAGEAL ECHOCARDIOGRAM N/A 10/19/2013   Procedure: INTRAOPERATIVE TRANSESOPHAGEAL ECHOCARDIOGRAM;  Surgeon: Gaye Pollack, MD;  Location: [facility] OR;  Service: Open Heart Surgery;  Laterality: N/A;  . IR FLUORO GUIDE CV LINE RIGHT  12/10/2017  . IR US GUIDE VASC ACCESS  RIGHT  12/10/2017  . KNEE ARTHROSCOPY Bilateral 1990's  . LEAD REVISION N/A 06/05/2013   Procedure: LEAD REVISION;  Surgeon: Thompson Grayer, MD;  Location: [facility] CATH LAB;  Service: Cardiovascular;  Laterality: N/A;  . LEFT HEART CATHETERIZATION WITH CORONARY ANGIOGRAM N/A 11/24/2012   Procedure: LEFT HEART CATHETERIZATION WITH CORONARY ANGIOGRAM;  Surgeon: Peter M Martinique, MD;  Location: [facility] CATH LAB;  Service: Cardiovascular;  Laterality: N/A;  . LIPOMA EXCISION Left 2013   "near ear" (05/25/2013)  . [place]; 2000's  . PATCH ANGIOPLASTY Right 01/13/2016   Procedure: WITH  PATCH ANGIOPLASTY;  Surgeon: Mal Misty, MD;  Location: [place];  Service: Vascular;  Laterality: Right;  . RENAL ARTERY BYPASS Bilateral 2003  . REVASCULARIZATION / IN-SITU GRAFT LEG    . RIGHT HEART CATH N/A 11/29/2017   Procedure: RIGHT HEART CATH;  Surgeon:  Jolaine Artist, MD;  Location: [place] CV LAB;  Service: Cardiovascular;  Laterality: N/A;  . RIGHT HEART CATHETERIZATION N/A 09/17/2013   Procedure: RIGHT HEART CATH;  Surgeon: Jolaine Artist, MD;  Location: [facility] CATH LAB;  Service: Cardiovascular;  Laterality: N/A;  . RIGHT HEART CATHETERIZATION N/A 10/14/2013   Procedure: RIGHT HEART CATH;  Surgeon: Jolaine Artist, MD;  Location: [facility] CATH LAB;  Service: Cardiovascular;  Laterality: N/A;  . RIGHT HEART CATHETERIZATION N/A 11/16/2013   Procedure: RIGHT HEART CATH;  Surgeon: Jolaine Artist, MD;  Location: [facility] CATH LAB;  Service: Cardiovascular;  Laterality: N/A;  . RIGHT HEART CATHETERIZATION N/A 07/13/2014   Procedure: RIGHT HEART CATH;  Surgeon: Jolaine Artist, MD;  Location: [facility] CATH LAB;  Service: Cardiovascular;  Laterality: N/A;  . TEE WITHOUT CARDIOVERSION N/A 11/04/2013   Procedure: TRANSESOPHAGEAL ECHOCARDIOGRAM (TEE);  Surgeon: Larey Dresser, MD;  Location: [place];  Service: Cardiovascular;  Laterality: N/A;  . TEE WITHOUT CARDIOVERSION N/A 03/02/2015   Procedure: TRANSESOPHAGEAL ECHOCARDIOGRAM (TEE);  Surgeon: Jolaine Artist, MD;  Location: [facility] ENDOSCOPY;  Service: Cardiovascular;  Laterality: N/A;  . TEE WITHOUT CARDIOVERSION N/A 01/19/2016   Procedure: TRANSESOPHAGEAL ECHOCARDIOGRAM (TEE);  Surgeon: Larey Dresser, MD;  Location: [place];  Service: Cardiovascular;  Laterality: N/A;  . TONSILLECTOMY  1946  . TRANSURETHRAL RESECTION OF BLADDER TUMOR N/A 12/04/2017   Procedure: TRANSURETHRAL RESECTION OF BLADDER TUMOR (TURBT);  Surgeon: Lucas Mallow, MD;  Location: [place];  Service: Urology;  Laterality: N/A;  . TUMOR EXCISION Left 1960's   "fatty tumor" (05/25/2013)  . V-TACH ABLATION N/A 09/12/2011   Procedure: V-TACH ABLATION;  Surgeon: Evans Lance, MD;  Location: [facility] CATH LAB;  Service: Cardiovascular;  Laterality: N/A;  . V-TACH ABLATION N/A 06/08/2013   Procedure: V-TACH ABLATION;  Surgeon:  Evans Lance, MD;  Location: [facility] CATH LAB;  Service: Cardiovascular;  Laterality: N/A;    Home Medications:  Medications Prior to Admission  Medication Sig Dispense Refill Last Dose  . amiodarone (PACERONE) 200 MG tablet Take 200 mg by mouth daily.    05/14/2018 at Unknown time  . atorvastatin (LIPITOR) 80 MG tablet TAKE 1 TABLET DAILY AT 6 PM (Patient taking differently: Take 80 mg by mouth in the evening) 30 tablet 11 05/13/2018 at Unknown time  . Chlorpheniramine-DM (CORICIDIN COUGH/COLD) 4-30 MG TABS Take 1 tablet by mouth every 6 (six) hours as needed (for congestion and coughing).    unk  . Dextromethorphan-guaiFENesin (ROBITUSSIN COUGH+CHEST CONG DM PO) Take 20 mLs by mouth every 6 (six) hours as needed (for congestion  and coughing).    unk  . docusate sodium (COLACE) 100 MG capsule Take 200 mg by mouth daily.    05/14/2018 at Unknown time  . furosemide (LASIX) 20 MG tablet Take 20 mg by mouth daily as needed for fluid.   unk  . levothyroxine (SYNTHROID, LEVOTHROID) 150 MCG tablet Take 1 tablet (150 mcg total) by mouth daily before breakfast. 90 tablet 3 05/14/2018 at Unknown time  . Multiple Vitamins-Minerals (ICAPS PO) Take 1 tablet by mouth 2 (two) times daily.   05/14/2018 at Unknown time  . Multiple Vitamins-Minerals (MULTIVITAMIN PO) Take 1 tablet by mouth daily.   05/14/2018 at Unknown time  . pantoprazole (PROTONIX) 40 MG tablet TAKE 1 TABLET TWICE DAILY 180 tablet 6 05/14/2018 at Unknown time  . promethazine (PHENERGAN) 12.5 MG tablet Take 12.5 mg by mouth every 6 (six) hours as needed for nausea or vomiting.   unk  . ranolazine (RANEXA) 500 MG 12 hr tablet Take 1 tablet (500 mg total) by mouth 2 (two) times daily. 60 tablet 11 05/14/2018 at Unknown time  . sildenafil (REVATIO) 20 MG tablet Take 1 tablet (20 mg total) by mouth 3 (three) times daily. 90 tablet 11 05/14/2018 at Unknown time  . temazepam (RESTORIL) 30 MG capsule Take 1 capsule (30 mg total) by mouth at bedtime as needed for  sleep. 30 capsule 3 05/13/2018 at Unknown time  . warfarin (COUMADIN) 2 MG tablet Take 2 mg by mouth every evening.    05/11/2018  . zinc gluconate 50 MG tablet Take 50 mg by mouth 2 (two) times daily.    05/14/2018 at Unknown time   Allergies:  Allergies  Allergen Reactions  . Demerol [Meperidine] Other (See Comments)    Paralysis/ Could only move eyes    Family History  Problem Relation Age of Onset  . Heart attack Mother   . Coronary artery disease Mother   . Cancer Mother   . Heart disease Mother   . Hyperlipidemia Mother   . Hypertension Mother   . Coronary artery disease Father   . Stroke Father   . Cancer Father   . Heart disease Father        before age 76  . Hyperlipidemia Father   . Hypertension Father   . Heart attack Father   . Coronary artery disease Unknown    Social History:  reports that he quit smoking about 16 years ago. His smoking use included cigarettes. He has a 18.50 pack-year smoking history. He has never used smokeless tobacco. He reports that he drinks alcohol. He reports that he does not use drugs.  ROS: A complete review of systems was performed.  All systems are negative except for pertinent findings as noted. ROS   Physical Exam:  Vital signs in last 24 hours: Temp:  [97.5 F (36.4 C)-98.2 F (36.8 C)] 97.8 F (36.6 C) (07/12 0749) Pulse Rate:  [60-67] 62 (07/12 0320) Resp:  [12-20] 12 (07/12 0749) BP: (94-108)/(70-95) 107/88 (07/12 0749) SpO2:  [93 %-97 %] 93 % (07/12 0749) Weight:  [78.7 kg (173 lb 6.4 oz)] 78.7 kg (173 lb 6.4 oz) (07/12 1151) General:  Alert and oriented, No acute distress HEENT: Normocephalic, atraumatic Neck: No JVD or lymphadenopathy Cardiovascular: Normal temperature, adequate perfusion of extremities Lungs: Regular rate and effort Abdomen: Soft, nontender, nondistended, no abdominal masses Back: No CVA tenderness Extremities: No edema Neurologic: Grossly intact  Laboratory Data:  Results for orders placed or  performed during the hospital encounter of  05/14/18 (from the past 24 hour(s))  Heparin level (unfractionated)     Status: Abnormal   Collection Time: 05/15/18  4:22 PM  Result Value Ref Range   Heparin Unfractionated 0.24 (L) 0.30 - 0.70 IU/mL  Lactate dehydrogenase     Status: Abnormal   Collection Time: 05/16/18  6:03 AM  Result Value Ref Range   LDH 226 (H) 98 - 192 U/L  Protime-INR     Status: Abnormal   Collection Time: 05/16/18  6:03 AM  Result Value Ref Range   Prothrombin Time 19.9 (H) 11.4 - 15.2 seconds   INR 1.71   CBC     Status: Abnormal   Collection Time: 05/16/18  6:03 AM  Result Value Ref Range   WBC 5.4 4.0 - 10.5 K/uL   RBC 3.50 (L) 4.22 - 5.81 MIL/uL   Hemoglobin 8.6 (L) 13.0 - 17.0 g/dL   HCT 28.5 (L) 39.0 - 52.0 %   MCV 81.4 78.0 - 100.0 fL   MCH 24.6 (L) 26.0 - 34.0 pg   MCHC 30.2 30.0 - 36.0 g/dL   RDW 22.6 (H) 11.5 - 15.5 %   Platelets 206 150 - 400 K/uL  Basic metabolic panel     Status: Abnormal   Collection Time: 05/16/18  6:03 AM  Result Value Ref Range   Sodium 138 135 - 145 mmol/L   Potassium 4.3 3.5 - 5.1 mmol/L   Chloride 104 98 - 111 mmol/L   CO2 25 22 - 32 mmol/L   Glucose, Bld 105 (H) 70 - 99 mg/dL   BUN 29 (H) 8 - 23 mg/dL   Creatinine, Ser 1.61 (H) 0.61 - 1.24 mg/dL   Calcium 8.9 8.9 - 10.3 mg/dL   GFR calc non Af [name] 39 (L) >60 mL/min   GFR calc Af [name] 46 (L) >60 mL/min   Anion gap 9 5 - 15  Magnesium     Status: None   Collection Time: 05/16/18  6:03 AM  Result Value Ref Range   Magnesium 2.1 1.7 - 2.4 mg/dL  Heparin level (unfractionated)     Status: None   Collection Time: 05/16/18  6:03 AM  Result Value Ref Range   Heparin Unfractionated 0.32 0.30 - 0.70 IU/mL   *Note: Due to a large number of results and/or encounters for the requested time period, some results have not been displayed. A complete set of results can be found in Results Review.   Recent Results (from the past 240 hour(s))  MRSA PCR Screening     Status:  Abnormal   Collection Time: 05/14/18 11:26 AM  Result Value Ref Range Status   MRSA by PCR POSITIVE (A) NEGATIVE Final    Comment:        The GeneXpert MRSA Assay (FDA approved for NASAL specimens only), is one component of a comprehensive MRSA colonization surveillance program. It is not intended to diagnose MRSA infection nor to guide or monitor treatment for MRSA infections. RESULT CALLED TO, READ BACK BY AND VERIFIED WITH: Nichola Sizer RN 13:30 05/14/18 ([email or web address]) Performed at [facility], [place] [address] [place] [employer] [place], [place] [address]    Creatinine: Recent Labs    05/14/18 1030 05/15/18 2956 05/16/18 0603  CREATININE 1.50* 1.54* 1.61*    Impression/Assessment:  Bladder cancer with recurrence  Plan:  Seed with TURBT  Marton Redwood, III 05/16/2018, 12:46 PM

## 2018-05-16 NOTE — Anesthesia Preprocedure Evaluation (Signed)
Anesthesia Evaluation  Patient identified by MRN, date of birth, ID band Patient awake    Reviewed: Allergy & Precautions, NPO status , Patient's Chart, lab work & pertinent test results  Airway Mallampati: I  TM Distance: >3 FB Neck ROM: Full    Dental  (+) Edentulous Upper, Edentulous Lower, Dental Advisory Given   Pulmonary sleep apnea , former smoker,    breath sounds clear to auscultation       Cardiovascular hypertension, + CAD, + Past MI, + Cardiac Stents, + Peripheral Vascular Disease and +CHF  + dysrhythmias Atrial Fibrillation + Cardiac Defibrillator  Rhythm:Regular Rate:Normal  s/p LVAD 2014    Neuro/Psych CVA negative psych ROS   GI/Hepatic negative GI ROS,   Endo/Other  Hypothyroidism   Renal/GU CRFRenal disease     Musculoskeletal  (+) Arthritis ,   Abdominal   Peds  Hematology   Anesthesia Other Findings   Reproductive/Obstetrics                            Anesthesia Physical  Anesthesia Plan  ASA: IV  Anesthesia Plan: General   Post-op Pain Management:    Induction: Intravenous  PONV Risk Score and Plan: 3 and Ondansetron and Treatment may vary due to age or medical condition  Airway Management Planned: LMA  Additional Equipment: None  Intra-op Plan:   Post-operative Plan: Extubation in OR  Informed Consent: I have reviewed the patients History and Physical, chart, labs and discussed the procedure including the risks, benefits and alternatives for the proposed anesthesia with the patient or authorized representative who has indicated his/her understanding and acceptance.   Dental advisory given  Plan Discussed with: CRNA and Anesthesiologist  Anesthesia Plan Comments:         Lab Results  Component Value Date   WBC 5.4 05/16/2018   HGB 8.6 (L) 05/16/2018   HCT 28.5 (L) 05/16/2018   MCV 81.4 05/16/2018   PLT 206 05/16/2018   Lab Results   Component Value Date   CREATININE 1.61 (H) 05/16/2018   BUN 29 (H) 05/16/2018   NA 138 05/16/2018   K 4.3 05/16/2018   CL 104 05/16/2018   CO2 25 05/16/2018   Lab Results  Component Value Date   INR 1.71 05/16/2018   INR 1.84 05/15/2018   INR 1.79 05/14/2018   Echo: - Left ventricle: The cavity size was mildly dilated. Wall   thickness was normal. - Aorta: Aortic root is mildly dilated at 41 mm - Left atrium: The atrium was severely dilated. - Right ventricle: The cavity size was mildly dilated. Systolic   function was moderately to severely reduced. - Right atrium: The atrium was mildly dilated.  Anesthesia Quick Evaluation

## 2018-05-16 NOTE — Transfer of Care (Signed)
Immediate Anesthesia Transfer of Care Note  Patient: [name]  Procedure(s) Performed: TRANSURETHRAL RESECTION OF BLADDER TUMOR (TURBT) (N/A Bladder) CYSTOSCOPY WITH RETROGRADE PYELOGRAM (Bilateral Bladder)  Patient Location: PACU  Anesthesia Type:General  Level of Consciousness: awake, alert , oriented and patient cooperative  Airway & Oxygen Therapy: Patient Spontanous Breathing and Patient connected to face mask oxygen  Post-op Assessment: Report given to RN, Post -op Vital signs reviewed and stable and Patient moving all extremities  Post vital signs: Reviewed and stable  Last Vitals:  Vitals Value Taken Time  BP 105/86 05/16/2018  3:52 PM  Temp    Pulse 60 05/16/2018  3:52 PM  Resp 19 05/16/2018  3:52 PM  SpO2 100 % 05/16/2018  3:52 PM  Vitals shown include unvalidated device data.  Last Pain:  Vitals:   05/16/18 1537  TempSrc:   PainSc: 0-No pain         Complications: No apparent anesthesia complications and nausea and vomiting. Phenergan administered by PACU RN Dr. Marcell Barlow notified.

## 2018-05-16 NOTE — Progress Notes (Addendum)
Advanced Heart Failure VAD Team Note  PCP-Cardiologist: No primary care provider on file.   Subjective:    BMET pending.   INR 1.71. LDH stable pending. Hemoglobin 9.3 > 8.9 > 8.6. Remains on heparin drip. Denies bleeding.    Feels fine today. Denies CP, SOB, or bleeding.   LVAD INTERROGATION:  HeartMate 2 LVAD:   Flow 3.9 liters/min, speed 8800, power 4, PI 7.9.  No PI events/24 hours.   Objective:    Vital Signs:   Temp:  [97.5 F (36.4 C)-98.2 F (36.8 C)] 98.2 F (36.8 C) (07/12 0320) Pulse Rate:  [60-67] 62 (07/12 0320) Resp:  [13-20] 13 (07/12 0320) BP: (86-117)/(70-95) 101/82 (07/12 0421) SpO2:  [94 %-99 %] 96 % (07/12 0320) Weight:  [173 lb 6.4 oz (78.7 kg)] 173 lb 6.4 oz (78.7 kg) (07/12 0633) Last BM Date: 05/15/18 Mean arterial Pressure 95-284 modified systolic  Intake/Output:   Intake/Output Summary (Last 24 hours) at 05/16/2018 0714 Last data filed at 05/16/2018 0634 Gross per 24 hour  Intake 1013.01 ml  Output 275 ml  Net 738.01 ml     Physical Exam    General: Well appearing this am. NAD.  HEENT: Normal. Neck: Supple, JVP 7-8 cm. Carotids OK.  Cardiac:  Mechanical heart sounds with LVAD hum present.  Lungs:  CTAB, normal effort.  Abdomen:  NT, ND, no HSM. No bruits or masses. +BS  LVAD exit site:  Dressing dry and intact. Stabilization device present and accurately applied.  Extremities:  Warm and dry. No cyanosis, clubbing, rash, or edema.  Neuro:  Alert & oriented x 3. Cranial nerves grossly intact. Moves all 4 extremities w/o difficulty. Affect pleasant    Telemetry   Apaced 60s. Personally reviewed.   EKG    No new tracings.   Labs   Basic Metabolic Panel: Recent Labs  Lab 05/14/18 1030 05/15/18 0638  NA 137 138  K 4.1 3.9  CL 107 104  CO2 24 23  GLUCOSE 118* 109*  BUN 36* 30*  CREATININE 1.50* 1.54*  CALCIUM 9.2 8.7*  MG 2.2 2.2    Liver Function Tests: Recent Labs  Lab 05/14/18 1030  AST 24  ALT 14  ALKPHOS  140*  BILITOT 0.7  PROT 7.6  ALBUMIN 3.5   No results for input(s): LIPASE, AMYLASE in the last 168 hours. No results for input(s): AMMONIA in the last 168 hours.  CBC: Recent Labs  Lab 05/14/18 1030 05/15/18 0638 05/16/18 0603  WBC 5.3 5.4 5.4  NEUTROABS 3.8  --   --   HGB 9.3* 8.9* 8.6*  HCT 31.9* 30.1* 28.5*  MCV 82.4 82.5 81.4  PLT 203 207 206    INR: Recent Labs  Lab 05/12/18 05/14/18 1030 05/14/18 1831 05/15/18 0638 05/16/18 0603  INR 2.6 1.95 1.79 1.84 1.71    Other results:  EKG:    Imaging   No results found.   Medications:     Scheduled Medications: . amiodarone  200 mg Oral Daily  . atorvastatin  80 mg Oral q1800  . feeding supplement (ENSURE ENLIVE)  237 mL Oral BID BM  . levothyroxine  150 mcg Oral QAC breakfast  . multivitamin with minerals  1 tablet Oral Daily  . mupirocin ointment  1 application Nasal BID  . pantoprazole  40 mg Oral BID  . ranolazine  500 mg Oral BID  . sildenafil  20 mg Oral TID    Infusions: .  ceFAZolin (ANCEF) IV    .  heparin 1,300 Units/hr (05/15/18 1900)    PRN Medications: acetaminophen, docusate sodium, ondansetron (ZOFRAN) IV, promethazine, temazepam   Patient Profile   [name] is a 77 y.o. 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 VAD implantation was complicated by VT, syncope, AF/Aflutter and RHF. Required short term milrinone.  Admitted for optimization and heparin bridge prior to bladder surgery on Friday.   Assessment/Plan   1. Chronic systolic HF: s/p HM-II LVAD 10/2014.  - Recent RHC in 1/19 with mildly low output. Started on milrinone without benefit so it was stopped.  -Volume status stable. Takes PRN lasix at home. -VAD interrogated personally. Parameters stable.No change.  - s/p previous driveline repair 02/3328. No further pump stops - Restart  anticoagulation per Dr Gloriann Loan after surgery.  2.VT - underwent DC-CV 9/17, 08/17/17, and 04/01/18 -Continue amiodarone 200 mg daily  - Keep K > 4.0 Mg > 2.0. BMET pending.  3. 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  - Had 2 BCG infusions with Dr. Gloriann Loan but discontinued due to inability to tolerate - Now with recurrent tumor and plans for tumor resection with Dr Gloriann Loan on Friday.  - Now on heparin drip per pharmacy. INR 1.71 - On schedule for OR at 12:30 today with Dr Gloriann Loan 4. CKD, stage III:  - Had ESRD in setting of obstructive uropathy and ATN. Required HD x1 (11/2017) - Monitor daily BMET. Creatinine pending this am.  5. 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 Continue coumadin and plavix.(with h/o CVA). - Admitted 4/18 with recurrent GIB. Transfused 2u. EGD/colon negative - Admitted 12./18. hgb 6.5, Transfused 5u. EGD with 2 AVMs treated with APC - Had been getting octreotide infusions but too expensive for him. Willcontinue to hold for now - Hemoglobin 9.3 > 8.9 > 8.6. INR 1.71. Continue heparin. Denies bleeding.   6. Atrial arrhythmias: Atrial fibrillation, atrial tachycardia. S/p DC-CV 06/04/14, DC-CV 03/02/15 and 01/19/16.  - Remains in NSR.  - Continue amiodarone 200 mg daily 7. HTN:  -Modified systolic 51-884Z. No change.  8. LVAD:  - VAD interrogated personally. Parameters stable. - [place].  9. Anticoagulation management:  - Goal INR 2-2.5 - Now on heparin per pharmacy. INR 1.71 10. Lumbar compression fracture: s/p L3-L5 fusion 02/03/16. - Resolved. No change.  11. 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  - Continue atorvastatin. (and warfarin INR goal 2-2.5). S/p R CEA 01/13/16. Plavix stopped 12/18 due to recurrent GIB. No change.  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 Forde Dandy in past.  - Amio restarted due to recurrent atrial arrhythmias  and VT.  - Recent FT4 elevated. Synthroid cut to 150 daily in 12/18.  - TSH elevated 10.125, but T4 normal 1.05. Discussed with pharmacy. No changes to regimen for now.  13. Insomnia - Continue temazepam 30 qhs PRN. No change.   I reviewed the LVAD parameters from today, and compared the results to the patient's prior recorded data.  No programming changes were made.  The LVAD is functioning within specified parameters.  The patient performs LVAD self-test daily.  LVAD interrogation was negative for any significant power changes, alarms or PI events/speed drops.  LVAD equipment check completed and is in good working order.  Back-up equipment present.   LVAD  education done on emergency procedures and precautions and reviewed exit site care.  Length of Stay: [place], NP 05/16/2018, 7:14 AM  VAD Team --- VAD ISSUES ONLY--- Pager [phone] (7am - 7am)  Advanced Heart Failure Team  Pager [phone] (M-F; 7a - 4p)  Please contact [facility] for night-coverage after hours (4p -7a ) and weekends on [email or web address]  Patient seen with NP, agree with the above note.    He is going to the OR today for bladder surgery.    Would anticipate restart heparin gtt 6-8 hrs after procedure and then restarting warfarin.    Loralie Champagne 05/16/2018 1:21 PM

## 2018-05-17 DIAGNOSIS — I5043 Acute on chronic combined systolic (congestive) and diastolic (congestive) heart failure: Secondary | ICD-10-CM

## 2018-05-17 LAB — URINALYSIS, ROUTINE W REFLEX MICROSCOPIC
Specific Gravity, Urine: 1.01 (ref 1.005–1.030)
pH: 7 (ref 5.0–8.0)

## 2018-05-17 LAB — LACTATE DEHYDROGENASE: LDH: 220 U/L — AB (ref 98–192)

## 2018-05-17 LAB — HEPARIN LEVEL (UNFRACTIONATED)
Heparin Unfractionated: 0.15 IU/mL — ABNORMAL LOW (ref 0.30–0.70)
Heparin Unfractionated: 0.29 IU/mL — ABNORMAL LOW (ref 0.30–0.70)

## 2018-05-17 LAB — PROTIME-INR
INR: 1.67
PROTHROMBIN TIME: 19.6 s — AB (ref 11.4–15.2)

## 2018-05-17 LAB — BASIC METABOLIC PANEL
Anion gap: 6 (ref 5–15)
BUN: 24 mg/dL — AB (ref 8–23)
CO2: 26 mmol/L (ref 22–32)
CREATININE: 1.84 mg/dL — AB (ref 0.61–1.24)
Calcium: 8.5 mg/dL — ABNORMAL LOW (ref 8.9–10.3)
Chloride: 106 mmol/L (ref 98–111)
GFR calc Af Amer: 39 mL/min — ABNORMAL LOW (ref >60)
GFR, EST NON AFRICAN AMERICAN: 33 mL/min — AB (ref >60)
Glucose, Bld: 118 mg/dL — ABNORMAL HIGH (ref 70–99)
POTASSIUM: 4.9 mmol/L (ref 3.5–5.1)
SODIUM: 138 mmol/L (ref 135–145)

## 2018-05-17 LAB — CBC
HCT: 27.3 % — ABNORMAL LOW (ref 39.0–52.0)
Hemoglobin: 8.1 g/dL — ABNORMAL LOW (ref 13.0–17.0)
MCH: 24.7 pg — AB (ref 26.0–34.0)
MCHC: 29.7 g/dL — AB (ref 30.0–36.0)
MCV: 83.2 fL (ref 78.0–100.0)
PLATELETS: 159 10*3/uL (ref 150–400)
RBC: 3.28 MIL/uL — AB (ref 4.22–5.81)
RDW: 22.6 % — ABNORMAL HIGH (ref 11.5–15.5)
WBC: 5.4 10*3/uL (ref 4.0–10.5)

## 2018-05-17 LAB — MAGNESIUM: MAGNESIUM: 2.1 mg/dL (ref 1.7–2.4)

## 2018-05-17 MED ORDER — VANCOMYCIN HCL IN DEXTROSE 1-5 GM/200ML-% IV SOLN
1000.0000 mg | INTRAVENOUS | Status: DC
  Administered 2018-05-18 – 2018-05-20 (×3): 1000 mg via INTRAVENOUS
  Filled 2018-05-17 (×3): qty 200

## 2018-05-17 MED ORDER — SODIUM CHLORIDE 0.9% FLUSH: 10.0000 mL | Freq: Once | INTRAVENOUS | Status: AC

## 2018-05-17 MED ORDER — WARFARIN SODIUM 3 MG PO TABS
3.0000 mg | ORAL_TABLET | Freq: Once | ORAL | Status: AC
  Administered 2018-05-17: 3 mg via ORAL
  Filled 2018-05-17: qty 1

## 2018-05-17 MED ORDER — PIPERACILLIN-TAZOBACTAM 3.375 G IVPB 30 MIN
3.3750 g | Freq: Once | INTRAVENOUS | Status: AC
  Administered 2018-05-17: 3.375 g via INTRAVENOUS
  Filled 2018-05-17: qty 50

## 2018-05-17 MED ORDER — SODIUM CHLORIDE 0.9 % IV SOLN
1500.0000 mg | Freq: Once | INTRAVENOUS | Status: AC
Start: 2018-05-17 — End: 2018-05-17
  Administered 2018-05-17: 1500 mg via INTRAVENOUS
  Filled 2018-05-17: qty 1500

## 2018-05-17 MED ORDER — SODIUM CHLORIDE 0.9% FLUSH: 500.0000 mL | Freq: Once | INTRAVENOUS | Status: DC

## 2018-05-17 MED ORDER — PIPERACILLIN-TAZOBACTAM 3.375 G IVPB
3.3750 g | Freq: Three times a day (TID) | INTRAVENOUS | Status: DC
  Administered 2018-05-17 – 2018-05-21 (×11): 3.375 g via INTRAVENOUS
  Filled 2018-05-17 (×12): qty 50

## 2018-05-17 MED ORDER — SODIUM CHLORIDE 0.9 % IV BOLUS
500.0000 mL | Freq: Once | INTRAVENOUS | Status: AC
  Administered 2018-05-17: 500 mL via INTRAVENOUS

## 2018-05-17 NOTE — Consult Note (Addendum)
Urology Progress Note   1 Day Post-Op  Subjective: NAEON Intermittently hypotensive, but asymptomatic Tmax 38.6 this afternoon, most recently 37.2 after Tylenol. No leukocytosis this am. Given 1L bolus. B/UCx pending. Started on Vanc/Zosyn per medicine. Patient voiding via condom catheter, urine very thin Merlot color. Subjectively emptying well. Hgb 8.1 from 8.6 yesterday Cr 1.84 from 1.61 yesterday  Objective: Vital signs in last 24 hours: Temp:  [97.9 F (36.6 C)-101.5 F (38.6 C)] 99 F (37.2 C) (07/13 1655) Pulse Rate:  [62-72] 71 (07/13 1508) Resp:  [13-21] 14 (07/13 1508) BP: (72-111)/(62-90) 75/65 (07/13 1734) SpO2:  [92 %-99 %] 98 % (07/13 1205) Weight:  [79.4 kg (175 lb 0.7 oz)] 79.4 kg (175 lb 0.7 oz) (07/13 0447)  Intake/Output from previous day: 07/12 0701 - 07/13 0700 In: 1827 [P.O.:660; I.V.:1157] Out: 815 [Urine:815] Intake/Output this shift: Total I/O In: 594.4 [P.O.:480; I.V.:114.4] Out: 525 [Urine:525]  Physical Exam:  General: Alert and oriented CV: RRR Lungs: Clear Abdomen: Soft, non-tender, non-distended GU: Condom catheter in place with voided urine a very thin Mer[place] color. String of ureteral stent visible Ext: NT, No erythema  Lab Results: Recent Labs    05/15/18 0638 05/16/18 0603 05/17/18 0253  HGB 8.9* 8.6* 8.1*  HCT 30.1* 28.5* 27.3*   BMET Recent Labs    05/16/18 0603 05/17/18 0253  NA 138 138  K 4.3 4.9  CL 104 106  CO2 25 26  GLUCOSE 105* 118*  BUN 29* 24*  CREATININE 1.61* 1.84*  CALCIUM 8.9 8.5*     Studies/Results: Dg Retrograde Pyelogram  Result Date: 05/16/2018 CLINICAL DATA:  77 year old male with a history of TURBT EXAM: RETROGRADE PYELOGRAM COMPARISON:  None. FINDINGS: Multiple intraoperative fluoroscopic spot images during pyelogram. Initial image demonstrates cystoscope projecting over the lower anatomic pelvis. Retrograde infusion of contrast within the left ureter. Partial opacification of the left ureter  on the left collecting system with no evidence of hydronephrosis. Subsequently images of the right abdomen demonstrate partial opacification of the right collecting system which is dilated, with associated caliectasis. Final image demonstrates placement of a double-J right ureteral stent. IMPRESSION: Limited images during retrograde pyelogram demonstrate placement of a right-sided double-J ureteral stent for treatment of hydronephrosis. Please refer to the dictated operative report for full details of intraoperative findings and procedure. Electronically Signed   By: Corrie Mckusick D.O.   On: 05/16/2018 15:59    Assessment/Plan:  77 y.o. male s/p TURBT and right ureteral stent placement on 7/12.    - Medicine managing heparin bridge back to Warfarin - IV abx per medicine - Urology will plan to remove right ureteral stent on string 7/15 versus 7/16 as long as patient doing well clinically.  Case Rob Bunting 05/17/2018, 5:52 PM  I have seen and examined the patient and agree with Dr. Madelin Rear plan.   Briefly:  S: s/p TURBT in setting of heparin bridge form LVAD. Some hematuira as expected  O: NAD, family at bedside LVAD in place Prior abd scars w/o hernias Foley now out  A/P Agree with heparin / warfarin bridge, kep catheter out at this point.

## 2018-05-17 NOTE — Progress Notes (Signed)
Patient denies pain or discomfort this shift.  Blood noted in urine bag, intact with condom catheter intact.  See flow sheets for total.  Heparin infusion continues @ 13 ml/hr.

## 2018-05-17 NOTE — Progress Notes (Signed)
[place] for heparin, Coumadin Indication: LVAD  Allergies  Allergen Reactions  . Demerol [Meperidine] Other (See Comments)    Paralysis/ Could only move eyes    Patient Measurements: Ht: 6' Wt: 78.6 kg Heparin Dosing Weight: 78.6 kg  Vital Signs: Temp: 98.9 F (37.2 C) (07/13 0732) Temp Source: Oral (07/13 0732) BP: 98/78 (07/13 0732) Pulse Rate: 62 (07/13 0732)  Labs: Recent Labs    05/15/18 6962  05/16/18 0603 05/17/18 0253 05/17/18 0718  HGB 8.9*  --  8.6* 8.1*  --   HCT 30.1*  --  28.5* 27.3*  --   PLT 207  --  206 159  --   LABPROT 21.1*  --  19.9* 19.6*  --   INR 1.84  --  1.71 1.67  --   HEPARINUNFRC 0.11*   < > 0.32 0.15* 0.29*  CREATININE 1.54*  --  1.61* 1.84*  --    < > = values in this interval not displayed.    Estimated Creatinine Clearance: 36.3 mL/min (A) (by C-G formula based on SCr of 1.84 mg/dL (H)).  Assessment: 35 yom presenting for planned bladder tumor resection on Friday, with hx of LVAD HM II, on warfarin PTA. Warfarin regimen is 2 mg daily (last dose on 7/7).  Patient started on heparin bridge 7/10. Resection of bladder tumor this am. No complications noted.   Heparin level at low end of goal this am on 1300 units/hr. INR has only slightly trended down to 1.67, last dose was Sunday night 7/7.   Goal of Therapy:  Heparin level 0.3-0.5 units/ml INR 2-2.5  Monitor platelets by anticoagulation protocol: Yes   Plan:  -Restart heparin at 1300 units/hr -Recheck heparin level in am -Warfarin 3 mg tonight - likely resume home dose of 2 mg daily after this dose.  Marguerite Olea, [facility] Clinical Pharmacist Phone [phone]  05/17/2018 8:27 AM

## 2018-05-17 NOTE — Progress Notes (Signed)
[place] checking VAD numbers per protocol, PI is low at 3.7. Patient had temp of 101.5 earlier, gave PRN Tylenol, now at 100, 99.8. Dopplers in 60s.  VAD coordinator paged, MD ordered NS bolus, blood/urine cultures, abx.  1605- PI now 4.7, Doppler 66. 1655- PI 5.8-6, Doppler 74, temp 99. Bolus finishing, awaiting urine sample before giving abx.  Will monitor closely. Updated VAD coordinator.

## 2018-05-17 NOTE — Progress Notes (Signed)
Pharmacy Antibiotic Note  [name] is a 77 y.o. male admitted on 05/14/2018 with bladder tumor s/p resection.  Pharmacy has been consulted for vancomycin and zosyn dosing for fever after surgery. WBC wnl. Tm 101.17F. SCr 1.84. CrCl ~ 30-35 mL/min.   Plan: -Zosyn 3.375 gm IV Q 8 hours  -Vancomycin 1500 mg IV once, then start vancomycin 1 gm IV Q 24 hours  -Monitor CBC, renal fx, cultures and clinical progress -VT at [facility]   Height: 6' 0.01" (182.9 cm) Weight: 175 lb 0.7 oz (79.4 kg) IBW/kg (Calculated) : 77.62  Temp (24hrs), Avg:98.9 F (37.2 C), Min:97 F (36.1 C), Max:101.5 F (38.6 C)  Recent Labs  Lab 05/14/18 1030 05/15/18 0638 05/16/18 0603 05/17/18 0253  WBC 5.3 5.4 5.4 5.4  CREATININE 1.50* 1.54* 1.61* 1.84*    Estimated Creatinine Clearance: 36.3 mL/min (A) (by C-G formula based on SCr of 1.84 mg/dL (H)).    Allergies  Allergen Reactions  . Demerol [Meperidine] Other (See Comments)    Paralysis/ Could only move eyes    Antimicrobials this admission: Vanc 7/13 >>  Zosyn 7/13 >>   Dose adjustments this admission:   Microbiology results:  7/10 MRSA PCR: neg  Thank you for allowing pharmacy to be a part of this patient's care.  Albertina Parr, PharmD., BCPS Clinical Pharmacist Clinical phone for 05/17/18 until 3:30pm: [phone] If after 3:30pm, please refer to [facility] for unit-specific pharmacist

## 2018-05-17 NOTE — Plan of Care (Signed)
  Problem: Activity: Goal: Capacity to carry out activities will improve Outcome: Progressing   Problem: Cardiac: Goal: Ability to achieve and maintain adequate cardiopulmonary perfusion will improve Outcome: Progressing   Problem: Education: Goal: Knowledge of General Education information will improve Outcome: Progressing   Problem: Health Behavior/Discharge Planning: Goal: Ability to manage health-related needs will improve Outcome: Progressing   Problem: Clinical Measurements: Goal: Ability to maintain clinical measurements within normal limits will improve Outcome: Progressing Goal: Will remain free from infection Outcome: Progressing Goal: Diagnostic test results will improve Outcome: Progressing Goal: Respiratory complications will improve Outcome: Progressing Goal: Cardiovascular complication will be avoided Outcome: Progressing   Problem: Activity: Goal: Risk for activity intolerance will decrease Outcome: Progressing   Problem: Nutrition: Goal: Adequate nutrition will be maintained Outcome: Progressing   Problem: Coping: Goal: Level of anxiety will decrease Outcome: Progressing   Problem: Elimination: Goal: Will not experience complications related to bowel motility Outcome: Progressing   Problem: Pain Managment: Goal: General experience of comfort will improve Outcome: Progressing   Problem: Safety: Goal: Ability to remain free from injury will improve Outcome: Progressing   Problem: Skin Integrity: Goal: Risk for impaired skin integrity will decrease Outcome: Progressing   Problem: Elimination: Goal: Will not experience complications related to urinary retention Outcome: Completed/Met

## 2018-05-17 NOTE — Progress Notes (Signed)
Patient ID: [name], male   DOB: [birth year], 77 y.o.   MRN: [id number]   Advanced Heart Failure VAD Team Note  PCP-Cardiologist: No primary care provider on file.   Subjective:    7/12 had TURB + right ureteral stent, no complications.   Some old dark blood in foley this morning.    INR 1.67, on heparin gtt. LDH satble at 220, hgb 8.1.    Feels fine today. Denies CP, SOB.  LVAD INTERROGATION:  HeartMate 2 LVAD:   Flow 4.3 liters/min, speed 8800, power 4.6, PI 5.    Objective:    Vital Signs:   Temp:  [97 F (36.1 C)-99.1 F (37.3 C)] 98.9 F (37.2 C) (07/13 0732) Pulse Rate:  [58-62] 62 (07/13 0732) Resp:  [13-21] 15 (07/13 0732) BP: (72-111)/(62-90) 98/78 (07/13 0822) SpO2:  [92 %-100 %] 99 % (07/13 0732) Weight:  [173 lb 6.4 oz (78.7 kg)-175 lb 0.7 oz (79.4 kg)] 175 lb 0.7 oz (79.4 kg) (07/13 0447) Last BM Date: 05/15/18 Mean arterial Pressure 80s  Intake/Output:   Intake/Output Summary (Last 24 hours) at 05/17/2018 1047 Last data filed at 05/17/2018 0900 Gross per 24 hour  Intake 2066.95 ml  Output 990 ml  Net 1076.95 ml     Physical Exam    General: Well appearing this am. NAD.  HEENT: Normal. Neck: Supple, JVP 7-8 cm. Carotids OK.  Cardiac:  Mechanical heart sounds with LVAD hum present.  Lungs:  CTAB, normal effort.  Abdomen:  NT, ND, no HSM. No bruits or masses. +BS  LVAD exit site: Well-healed and incorporated. Dressing dry and intact. No erythema or drainage. Stabilization device present and accurately applied. Driveline dressing changed daily per sterile technique. Extremities:  Warm and dry. No cyanosis, clubbing, rash, or edema.  Neuro:  Alert & oriented x 3. Cranial nerves grossly intact. Moves all 4 extremities w/o difficulty. Affect pleasant    Telemetry   A-paced 60s. Personally reviewed.   EKG    No new tracings.   Labs   Basic Metabolic Panel: Recent Labs  Lab 05/14/18 1030 05/15/18 0638 05/16/18 0603 05/17/18 0253  NA  137 138 138 138  K 4.1 3.9 4.3 4.9  CL 107 104 104 106  CO2 24 23 25 26   GLUCOSE 118* 109* 105* 118*  BUN 36* 30* 29* 24*  CREATININE 1.50* 1.54* 1.61* 1.84*  CALCIUM 9.2 8.7* 8.9 8.5*  MG 2.2 2.2 2.1 2.1    Liver Function Tests: Recent Labs  Lab 05/14/18 1030  AST 24  ALT 14  ALKPHOS 140*  BILITOT 0.7  PROT 7.6  ALBUMIN 3.5   No results for input(s): LIPASE, AMYLASE in the last 168 hours. No results for input(s): AMMONIA in the last 168 hours.  CBC: Recent Labs  Lab 05/14/18 1030 05/15/18 0638 05/16/18 0603 05/17/18 0253  WBC 5.3 5.4 5.4 5.4  NEUTROABS 3.8  --   --   --   HGB 9.3* 8.9* 8.6* 8.1*  HCT 31.9* 30.1* 28.5* 27.3*  MCV 82.4 82.5 81.4 83.2  PLT 203 207 206 159    INR: Recent Labs  Lab 05/14/18 1030 05/14/18 1831 05/15/18 0638 05/16/18 0603 05/17/18 0253  INR 1.95 1.79 1.84 1.71 1.67    Other results:  EKG:    Imaging   Dg Retrograde Pyelogram  Result Date: 05/16/2018 CLINICAL DATA:  77 year old male with a history of TURBT EXAM: RETROGRADE PYELOGRAM COMPARISON:  None. FINDINGS: Multiple intraoperative fluoroscopic spot images during pyelogram. Initial image  demonstrates cystoscope projecting over the lower anatomic pelvis. Retrograde infusion of contrast within the left ureter. Partial opacification of the left ureter on the left collecting system with no evidence of hydronephrosis. Subsequently images of the right abdomen demonstrate partial opacification of the right collecting system which is dilated, with associated caliectasis. Final image demonstrates placement of a double-J right ureteral stent. IMPRESSION: Limited images during retrograde pyelogram demonstrate placement of a right-sided double-J ureteral stent for treatment of hydronephrosis. Please refer to the dictated operative report for full details of intraoperative findings and procedure. Electronically Signed   By: Corrie Mckusick D.O.   On: 05/16/2018 15:59     Medications:      Scheduled Medications: . amiodarone  200 mg Oral Daily  . atorvastatin  80 mg Oral q1800  . feeding supplement (ENSURE ENLIVE)  237 mL Oral BID BM  . levothyroxine  150 mcg Oral QAC breakfast  . multivitamin with minerals  1 tablet Oral Daily  . mupirocin ointment  1 application Nasal BID  . pantoprazole  40 mg Oral BID  . ranolazine  500 mg Oral BID  . sildenafil  20 mg Oral TID  . warfarin  3 mg Oral ONCE-1800  . Warfarin - Pharmacist Dosing Inpatient   Does not apply q1800    Infusions: . heparin 1,300 Units/hr (05/17/18 0727)  . lactated ringers 10 mL/hr at 05/16/18 1152    PRN Medications: acetaminophen, docusate sodium, ondansetron (ZOFRAN) IV, promethazine, temazepam   Patient Profile   [name] is a 77 y.o. male with h/o VT, PAD and severe systolic HF (EF 69-67%) due to mixed ischemic/nonischemic CM he is s/p HM II LVAD implant for destination therapy on October 19, 2013. VAD implantation was complicated by VT, syncope, AF/Aflutter and RHF. Required short term milrinone.  Admitted for optimization and heparin bridge prior to bladder surgery on Friday.   Assessment/Plan   1. Chronic systolic HF: s/p HM-II LVAD 10/2014. [place] in 1/19 with mildly low output. Started on milrinone without benefit so it was stopped. Volume status stable. Takes PRN lasix at home.  VAD interrogated personally. Parameters stable.No change. s/p previous driveline repair 06/9380. No further pump stops - Back on heparin gtt + warfarin post-urology procedure.  Continue heparin gtt until INR 1.8.   2.VT: Underwent DC-CV 9/17, 08/17/17, and 04/01/18 -Continue amiodarone 200 mg daily  - Keep K > 4.0 Mg > 2.0.  3. Recurrent renal stone and bladder tumor: S/p stone removal. 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 Had 2 BCG infusions with Dr. Gloriann Loan but discontinued due to inability to tolerate.  Now with recurrent tumor and s/p TURB with right ureteral stent placement on  7/12.  Some old blood in foley bag, likely result of procedure.  4. CKD, stage III: Had obstructive uropathy and ATN. Required HD x1 (11/2017).  Creatinine mildly higher today, monitor.   5. 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  Admitted 4/18 with recurrent GIB. Transfused 2u. EGD/colon negative.  Admitted 12/18 with hgb 6.5, Transfused 5u. EGD with 2 AVMs treated with APC.  - Had been getting octreotide infusions but too expensive for him. Willcontinue to hold for now - Hemoglobin 9.3 > 8.9 > 8.6 > 8.1.  No overt GI bleeding, continue heparin => warfarin for now.  6. Atrial arrhythmias: Atrial fibrillation, atrial tachycardia. S/p DC-CV 06/04/14, DC-CV 03/02/15 and 01/19/16.  - Remains in NSR/a-paced.   - Continue amiodarone 200 mg daily 7.  HTN: MAP 80s.  8. LVAD: VAD interrogated personally. Parameters stable. LDH stable.  9. Anticoagulation management:  - Goal INR 2-2.5 - Now on heparin per pharmacy. INR 1.67, continue heparin gtt until INR 1.8.  10. Lumbar compression fracture:s/p L3-L5 fusion 02/03/16. - Resolved. No change.  11. 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 Continue atorvastatin. (and warfarin INR goal 2-2.5). S/p R CEA 01/13/16. Plavix stopped 12/18 due to recurrent GIB. No change.  12. H/o amio-induced hyperthyroidism: Followed by Dr. Forde Dandy in past. Amio restarted due to recurrent atrial arrhythmias and VT. Recent FT4 elevated. Synthroid cut to 150 daily in 12/18. TSH elevated 10.125, but T4 normal 1.05. Discussed with pharmacy. No changes to regimen for now.  13. Insomnia: Continue temazepam 30 qhs PRN. No change.   I reviewed the LVAD parameters from today, and compared the results to the patient's prior recorded data.  No programming changes were made.  The LVAD is functioning within specified parameters.  The patient performs LVAD self-test daily.  LVAD interrogation was negative for any significant power changes, alarms or PI events/speed drops.   LVAD equipment check completed and is in good working order.  Back-up equipment present.   LVAD education done on emergency procedures and precautions and reviewed exit site care.  Length of Stay: 3  Loralie Champagne, MD 05/17/2018, 10:47 AM  VAD Team --- VAD ISSUES ONLY--- Pager [phone] (7am - 7am)  Advanced Heart Failure Team  Pager [phone] (M-F; 7a - 4p)  Please contact [facility] for night-coverage after hours (4p -7a ) and weekends on [email or web address]

## 2018-05-18 ENCOUNTER — Inpatient Hospital Stay (HOSPITAL_COMMUNITY): Payer: Medicare Other

## 2018-05-18 LAB — CBC
HCT: 26.9 % — ABNORMAL LOW (ref 39.0–52.0)
HEMOGLOBIN: 7.9 g/dL — AB (ref 13.0–17.0)
MCH: 24.6 pg — AB (ref 26.0–34.0)
MCHC: 29.4 g/dL — ABNORMAL LOW (ref 30.0–36.0)
MCV: 83.8 fL (ref 78.0–100.0)
PLATELETS: 159 10*3/uL (ref 150–400)
RBC: 3.21 MIL/uL — AB (ref 4.22–5.81)
RDW: 22.9 % — ABNORMAL HIGH (ref 11.5–15.5)
WBC: 5.3 10*3/uL (ref 4.0–10.5)

## 2018-05-18 LAB — BASIC METABOLIC PANEL
Anion gap: 6 (ref 5–15)
BUN: 24 mg/dL — AB (ref 8–23)
CHLORIDE: 108 mmol/L (ref 98–111)
CO2: 24 mmol/L (ref 22–32)
CREATININE: 1.87 mg/dL — AB (ref 0.61–1.24)
Calcium: 8.5 mg/dL — ABNORMAL LOW (ref 8.9–10.3)
GFR calc Af Amer: 38 mL/min — ABNORMAL LOW (ref >60)
GFR, EST NON AFRICAN AMERICAN: 33 mL/min — AB (ref >60)
Glucose, Bld: 102 mg/dL — ABNORMAL HIGH (ref 70–99)
POTASSIUM: 4.6 mmol/L (ref 3.5–5.1)
SODIUM: 138 mmol/L (ref 135–145)

## 2018-05-18 LAB — HEPARIN LEVEL (UNFRACTIONATED): HEPARIN UNFRACTIONATED: 0.42 [IU]/mL (ref 0.30–0.70)

## 2018-05-18 LAB — URINE CULTURE: Culture: NO GROWTH

## 2018-05-18 LAB — PROTIME-INR
INR: 2.02
PROTHROMBIN TIME: 22.7 s — AB (ref 11.4–15.2)

## 2018-05-18 LAB — MAGNESIUM: Magnesium: 1.9 mg/dL (ref 1.7–2.4)

## 2018-05-18 LAB — LACTATE DEHYDROGENASE: LDH: 243 U/L — AB (ref 98–192)

## 2018-05-18 MED ORDER — MAGNESIUM SULFATE IN D5W 1-5 GM/100ML-% IV SOLN
1.0000 g | Freq: Once | INTRAVENOUS | Status: AC
  Administered 2018-05-18: 1 g via INTRAVENOUS
  Filled 2018-05-18: qty 100

## 2018-05-18 MED ORDER — WARFARIN SODIUM 2 MG PO TABS
2.0000 mg | ORAL_TABLET | Freq: Once | ORAL | Status: AC
  Administered 2018-05-18: 2 mg via ORAL
  Filled 2018-05-18: qty 1

## 2018-05-18 NOTE — Progress Notes (Signed)
Patient ID: [name], male   DOB: 1941/02/06, 77 y.o.   MRN: [id number]   Advanced Heart Failure VAD Team Note  PCP-Cardiologist: No primary care provider on file.   Subjective:    7/12 had TURB + right ureteral stent.   Still with hematuria, expected per urology.   INR 2, creatinine mildly elevated to 1.87, WBCs normal.  Hgb down to 7.9.     He had fever to 101.5 yesterday pm, afebrile this morning.  Blood cultures/urine culture sent.  Started empirically on vancomycin/Zosyn.   No complaints this morning.   LVAD INTERROGATION:  HeartMate 2 LVAD:   Flow 4 liters/min, speed 8800, power 4.5, PI 6.8.  No PI events.    Objective:    Vital Signs:   Temp:  [98.1 F (36.7 C)-101.5 F (38.6 C)] 98.1 F (36.7 C) (07/14 0800) Pulse Rate:  [71-72] 71 (07/13 1508) Resp:  [13-19] 15 (07/14 0409) BP: (68-103)/(56-86) 98/86 (07/14 0800) SpO2:  [98 %] 98 % (07/13 1508) Weight:  [177 lb 11.1 oz (80.6 kg)] 177 lb 11.1 oz (80.6 kg) (07/14 0409) Last BM Date: 05/15/18 Mean arterial Pressure 60s yesterday, now around 90.   Intake/Output:   Intake/Output Summary (Last 24 hours) at 05/18/2018 0927 Last data filed at 05/18/2018 0900 Gross per 24 hour  Intake 1100.77 ml  Output 800 ml  Net 300.77 ml     Physical Exam    General: Well appearing this am. NAD.  HEENT: Normal. Neck: Supple, JVP 8-9 cm. Carotids OK.  Cardiac:  Mechanical heart sounds with LVAD hum present.  Lungs:  CTAB, normal effort.  Abdomen:  NT, ND, no HSM. No bruits or masses. +BS  LVAD exit site: Well-healed and incorporated. Dressing dry and intact. No erythema or drainage. Stabilization device present and accurately applied. Driveline dressing changed daily per sterile technique. Extremities:  Warm and dry. No cyanosis, clubbing, rash, or edema.  Neuro:  Alert & oriented x 3. Cranial nerves grossly intact. Moves all 4 extremities w/o difficulty. Affect pleasant   Telemetry   A-paced 60s. Personally  reviewed.   EKG    No new tracings.   Labs   Basic Metabolic Panel: Recent Labs  Lab 05/14/18 1030 05/15/18 0638 05/16/18 0603 05/17/18 0253 05/18/18 0237  NA 137 138 138 138 138  K 4.1 3.9 4.3 4.9 4.6  CL 107 104 104 106 108  CO2 24 23 25 26 24   GLUCOSE 118* 109* 105* 118* 102*  BUN 36* 30* 29* 24* 24*  CREATININE 1.50* 1.54* 1.61* 1.84* 1.87*  CALCIUM 9.2 8.7* 8.9 8.5* 8.5*  MG 2.2 2.2 2.1 2.1 1.9    Liver Function Tests: Recent Labs  Lab 05/14/18 1030  AST 24  ALT 14  ALKPHOS 140*  BILITOT 0.7  PROT 7.6  ALBUMIN 3.5   No results for input(s): LIPASE, AMYLASE in the last 168 hours. No results for input(s): AMMONIA in the last 168 hours.  CBC: Recent Labs  Lab 05/14/18 1030 05/15/18 0638 05/16/18 0603 05/17/18 0253 05/18/18 0237  WBC 5.3 5.4 5.4 5.4 5.3  NEUTROABS 3.8  --   --   --   --   HGB 9.3* 8.9* 8.6* 8.1* 7.9*  HCT 31.9* 30.1* 28.5* 27.3* 26.9*  MCV 82.4 82.5 81.4 83.2 83.8  PLT 203 207 206 159 159    INR: Recent Labs  Lab 05/14/18 1831 05/15/18 0638 05/16/18 0603 05/17/18 0253 05/18/18 0237  INR 1.79 1.84 1.71 1.67 2.02  Other results:  EKG:    Imaging   Dg Retrograde Pyelogram  Result Date: 05/16/2018 CLINICAL DATA:  77 year old male with a history of TURBT EXAM: RETROGRADE PYELOGRAM COMPARISON:  None. FINDINGS: Multiple intraoperative fluoroscopic spot images during pyelogram. Initial image demonstrates cystoscope projecting over the lower anatomic pelvis. Retrograde infusion of contrast within the left ureter. Partial opacification of the left ureter on the left collecting system with no evidence of hydronephrosis. Subsequently images of the right abdomen demonstrate partial opacification of the right collecting system which is dilated, with associated caliectasis. Final image demonstrates placement of a double-J right ureteral stent. IMPRESSION: Limited images during retrograde pyelogram demonstrate placement of a right-sided  double-J ureteral stent for treatment of hydronephrosis. Please refer to the dictated operative report for full details of intraoperative findings and procedure. Electronically Signed   By: Corrie Mckusick D.O.   On: 05/16/2018 15:59     Medications:     Scheduled Medications: . amiodarone  200 mg Oral Daily  . atorvastatin  80 mg Oral q1800  . levothyroxine  150 mcg Oral QAC breakfast  . multivitamin with minerals  1 tablet Oral Daily  . mupirocin ointment  1 application Nasal BID  . pantoprazole  40 mg Oral BID  . ranolazine  500 mg Oral BID  . sildenafil  20 mg Oral TID  . warfarin  2 mg Oral ONCE-1800  . Warfarin - Pharmacist Dosing Inpatient   Does not apply q1800    Infusions: . lactated ringers 10 mL/hr at 05/16/18 1152  . piperacillin-tazobactam (ZOSYN)  IV 3.375 g (05/18/18 5427)  . vancomycin      PRN Medications: acetaminophen, docusate sodium, ondansetron (ZOFRAN) IV, promethazine, temazepam   Patient Profile   [name] is a 77 y.o. 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 VAD implantation was complicated by VT, syncope, AF/Aflutter and RHF. Required short term milrinone.  Admitted for optimization and heparin bridge prior to bladder surgery on Friday.   Assessment/Plan   1. Chronic systolic HF: s/p HM-II LVAD 10/2014. [place] in 1/19 with mildly low output. Started on milrinone without benefit so it was stopped. Volume status stable. Takes PRN lasix at home.  VAD interrogated personally. Parameters stable.No change. s/p previous driveline repair 05/6282. No further pump stops - INR 2 today, may stop heparin gtt.    2. VT: Underwent DC-CV 9/17, 08/17/17, and 04/01/18 -Continue amiodarone 200 mg daily  - Keep K > 4.0 Mg > 2.0.  3. Recurrent renal stone and bladder tumor: S/p stone removal. 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 Had 2 BCG infusions with Dr. Gloriann Loan but discontinued due to inability to tolerate.  Now with recurrent tumor and s/p TURB with right ureteral stent placement on 7/12.  Hematuria still present, likely result of procedure.  - Will need ureteral stent removed Monday or Tuesday, can be done with therapeutic INR.  4. CKD, stage III: Had obstructive uropathy and ATN. Required HD x1 (11/2017).  Creatinine mildly higher today, monitor.  Has ureteral stent on right.  5. 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  Admitted 4/18 with recurrent GIB. Transfused 2u. EGD/colon negative.  Admitted 12/18 with hgb 6.5, Transfused 5u. EGD with 2 AVMs treated with APC.  - Had been getting octreotide infusions but too expensive for him. Willcontinue to hold  for now - Hemoglobin 9.3 > 8.9 > 8.6 > 8.1>7.9.  No overt GI bleeding, may be related to hematuria/GU procedure. - Stop heparin gtt with therapeutic INR.  6. Atrial arrhythmias: Atrial fibrillation, atrial tachycardia. S/p DC-CV 06/04/14, DC-CV 03/02/15 and 01/19/16.  - Remains in NSR/a-paced.   - Continue amiodarone 200 mg daily 7. HTN: MAP 90 currently, hypotensive with fever yesterday and got a bolus.   8. LVAD: VAD interrogated personally. Parameters stable. LDH stable.  9. Anticoagulation management:  - Goal INR 2-2.5 - Stop heparin gtt, continue warfarin.   10. Lumbar compression fracture:s/p L3-L5 fusion 02/03/16. - Resolved. No change.  11. 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 Continue atorvastatin. (and warfarin INR goal 2-2.5). S/p R CEA 01/13/16. Plavix stopped 12/18 due to recurrent GIB. No change.  12. H/o amio-induced hyperthyroidism: Followed by Dr. Forde Dandy in past. Amio restarted due to recurrent atrial arrhythmias and VT. Recent FT4 elevated. Synthroid cut to 150 daily in 12/18. TSH elevated 10.125, but T4 normal 1.05.  No changes to regimen for now.  13. ID: Fever yesterday, WBCs not elevated.  Afebrile this  morning.  Urine and blood cultures sent.  Suspect related to urinary tract instrumentation.  - Will order CXR.  - Continue empiric vanc/Zosyn pending cultures.   He will need to be observed at least another day in hospital due to fever yesterday.   I reviewed the LVAD parameters from today, and compared the results to the patient's prior recorded data.  No programming changes were made.  The LVAD is functioning within specified parameters.  The patient performs LVAD self-test daily.  LVAD interrogation was negative for any significant power changes, alarms or PI events/speed drops.  LVAD equipment check completed and is in good working order.  Back-up equipment present.   LVAD education done on emergency procedures and precautions and reviewed exit site care.  Length of Stay: 4  Loralie Champagne, MD 05/18/2018, 9:27 AM  VAD Team --- VAD ISSUES ONLY--- Pager [phone] (7am - 7am)  Advanced Heart Failure Team  Pager [phone] (M-F; 7a - 4p)  Please contact [facility] for night-coverage after hours (4p -7a ) and weekends on [email or web address]

## 2018-05-18 NOTE — Progress Notes (Signed)
[place] for heparin, Coumadin Indication: LVAD  Allergies  Allergen Reactions  . Demerol [Meperidine] Other (See Comments)    Paralysis/ Could only move eyes    Patient Measurements: Ht: 6' Wt: 78.6 kg Heparin Dosing Weight: 78.6 kg  Vital Signs: Temp: 98.1 F (36.7 C) (07/14 0800) Temp Source: Oral (07/14 0800) BP: 98/86 (07/14 0800)  Labs: Recent Labs    05/16/18 0603 05/17/18 0253 05/17/18 0718 05/18/18 0237  HGB 8.6* 8.1*  --  7.9*  HCT 28.5* 27.3*  --  26.9*  PLT 206 159  --  159  LABPROT 19.9* 19.6*  --  22.7*  INR 1.71 1.67  --  2.02  HEPARINUNFRC 0.32 0.15* 0.29* 0.Frank  CREATININE 1.61* 1.84*  --  1.87*    Estimated Creatinine Clearance: 35.7 mL/min (A) (by C-G formula based on SCr of 1.87 mg/dL (H)).   . heparin 1,300 Units/hr (05/18/18 0400)  . lactated ringers 10 mL/hr at 05/16/18 1152  . piperacillin-tazobactam (ZOSYN)  IV 3.375 g (05/17/18 2331)  . vancomycin       Assessment: [name] presenting for planned bladder tumor resection on Friday, with hx of LVAD HM II, on warfarin PTA. Warfarin regimen is 2 mg daily (last dose on 7/7).  Patient started on heparin bridge 7/10. Resection of bladder tumor 7/12. No complications noted.   Heparin level at goal this am on 1300 units/hr. INR now back up to 2.02 after resuming Coumadin 2 days ago.  Goal of Therapy:  Heparin level 0.3-0.5 units/ml INR 2-2.5  Monitor platelets by anticoagulation protocol: Yes   Plan:  -D/c IV heparin since INR at goal. -Warfarin 2 mg tonight - consistent with home dose. -Daily INR, CBC, and heparin level.  Marguerite Olea, [facility] Clinical Pharmacist Phone [phone]  05/18/2018 8:16 AM

## 2018-05-18 NOTE — Consult Note (Addendum)
Urology Progress Note   2 Days Post-Op  Subjective: NAEON AFVSS No leukocytosis B/UCx pending Hgb stable at 7.9 Patient voiding volitionally and emptying subjectively well Cr stable at 1.87  Objective: Vital signs in last 24 hours: Temp:  [98.1 F (36.7 C)-101.5 F (38.6 C)] 98.1 F (36.7 C) (07/14 0800) Pulse Rate:  [71-72] 71 (07/13 1508) Resp:  [13-19] 15 (07/14 0409) BP: (68-103)/(56-86) 98/86 (07/14 0800) SpO2:  [98 %] 98 % (07/13 1508) Weight:  [80.6 kg (177 lb 11.1 oz)] 80.6 kg (177 lb 11.1 oz) (07/14 0409)  Intake/Output from previous day: 07/13 0701 - 07/14 0700 In: 1340.8 [P.O.:480; I.V.:282.2; IV Piggyback:578.6] Out: 850 [Urine:850] Intake/Output this shift: No intake/output data recorded.  Physical Exam:  General: Alert and oriented CV: RRR Lungs: Clear Abdomen: Soft, non-tender, non-distended Ext: NT, No erythema  Lab Results: Recent Labs    05/16/18 0603 05/17/18 0253 05/18/18 0237  HGB 8.6* 8.1* 7.9*  HCT 28.5* 27.3* 26.9*   BMET Recent Labs    05/17/18 0253 05/18/18 0237  NA 138 138  K 4.9 4.6  CL 106 108  CO2 26 24  GLUCOSE 118* 102*  BUN 24* 24*  CREATININE 1.84* 1.87*  CALCIUM 8.5* 8.5*     Studies/Results: Dg Retrograde Pyelogram  Result Date: 05/16/2018 CLINICAL DATA:  77 year old male with a history of TURBT EXAM: RETROGRADE PYELOGRAM COMPARISON:  None. FINDINGS: Multiple intraoperative fluoroscopic spot images during pyelogram. Initial image demonstrates cystoscope projecting over the lower anatomic pelvis. Retrograde infusion of contrast within the left ureter. Partial opacification of the left ureter on the left collecting system with no evidence of hydronephrosis. Subsequently images of the right abdomen demonstrate partial opacification of the right collecting system which is dilated, with associated caliectasis. Final image demonstrates placement of a double-J right ureteral stent. IMPRESSION: Limited images during  retrograde pyelogram demonstrate placement of a right-sided double-J ureteral stent for treatment of hydronephrosis. Please refer to the dictated operative report for full details of intraoperative findings and procedure. Electronically Signed   By: Corrie Mckusick D.O.   On: 05/16/2018 15:59    Assessment/Plan:  77 y.o. male s/p TURBT and right ureteral stent placement on 7/12.    - Medicine managing heparin bridge back to Warfarin - IV abx per medicine - Urology will plan to remove right ureteral stent on string 7/15 versus 7/16 as long as patient doing well clinically  Case Rob Bunting 05/18/2018, 8:58 AM   I have seen and examined the pateint and agree with Dr. Madelin Rear plan.   Briefly:  S: POD 2 s/p TURBT. Foley now out and voiding with some hematuria as expedted  O: NAD, having very hearty breakfast of fried chicken / potatoes / gravy. LVAD in place NO CVAT NO SP distension.  A/P: Continue heparin / warfarin bridge, likely remove stent tomorrow.

## 2018-05-19 ENCOUNTER — Inpatient Hospital Stay (HOSPITAL_COMMUNITY): Payer: Medicare Other

## 2018-05-19 ENCOUNTER — Encounter (HOSPITAL_COMMUNITY): Payer: Self-pay | Admitting: Urology

## 2018-05-19 LAB — BASIC METABOLIC PANEL
Anion gap: 7 (ref 5–15)
BUN: 26 mg/dL — AB (ref 8–23)
CO2: 23 mmol/L (ref 22–32)
CREATININE: 2.15 mg/dL — AB (ref 0.61–1.24)
Calcium: 8.4 mg/dL — ABNORMAL LOW (ref 8.9–10.3)
Chloride: 104 mmol/L (ref 98–111)
GFR calc Af Amer: 32 mL/min — ABNORMAL LOW (ref >60)
GFR calc non Af Amer: 28 mL/min — ABNORMAL LOW (ref >60)
GLUCOSE: 125 mg/dL — AB (ref 70–99)
POTASSIUM: 4.4 mmol/L (ref 3.5–5.1)
Sodium: 134 mmol/L — ABNORMAL LOW (ref 135–145)

## 2018-05-19 LAB — PROTIME-INR
INR: 2.46
PROTHROMBIN TIME: 26.5 s — AB (ref 11.4–15.2)

## 2018-05-19 LAB — CBC
HCT: 25.9 % — ABNORMAL LOW (ref 39.0–52.0)
Hemoglobin: 8 g/dL — ABNORMAL LOW (ref 13.0–17.0)
MCH: 25.3 pg — AB (ref 26.0–34.0)
MCHC: 30.9 g/dL (ref 30.0–36.0)
MCV: 82 fL (ref 78.0–100.0)
Platelets: 132 10*3/uL — ABNORMAL LOW (ref 150–400)
RBC: 3.16 MIL/uL — ABNORMAL LOW (ref 4.22–5.81)
RDW: 22.5 % — AB (ref 11.5–15.5)
WBC: 6.7 10*3/uL (ref 4.0–10.5)

## 2018-05-19 LAB — MAGNESIUM: Magnesium: 2.2 mg/dL (ref 1.7–2.4)

## 2018-05-19 LAB — LACTATE DEHYDROGENASE: LDH: 240 U/L — ABNORMAL HIGH (ref 98–192)

## 2018-05-19 MED ORDER — FUROSEMIDE 10 MG/ML IJ SOLN
40.0000 mg | Freq: Once | INTRAMUSCULAR | Status: AC
  Administered 2018-05-19: 40 mg via INTRAVENOUS
  Filled 2018-05-19: qty 4

## 2018-05-19 MED ORDER — WARFARIN SODIUM 2 MG PO TABS
2.0000 mg | ORAL_TABLET | Freq: Once | ORAL | Status: AC
  Administered 2018-05-19: 2 mg via ORAL
  Filled 2018-05-19: qty 1

## 2018-05-19 NOTE — Anesthesia Postprocedure Evaluation (Signed)
Anesthesia Post Note  Patient: [name]  Procedure(s) Performed: TRANSURETHRAL RESECTION OF BLADDER TUMOR (TURBT) (N/A Bladder) CYSTOSCOPY WITH RETROGRADE PYELOGRAM (Bilateral Bladder)     Patient location during evaluation: PACU Anesthesia Type: General Level of consciousness: awake and alert Pain management: pain level controlled Vital Signs Assessment: post-procedure vital signs reviewed and stable Respiratory status: spontaneous breathing, nonlabored ventilation, respiratory function stable and patient connected to nasal cannula oxygen Cardiovascular status: blood pressure returned to baseline and stable Postop Assessment: no apparent nausea or vomiting Anesthetic complications: no    Last Vitals:  Vitals:   05/19/18 1057 05/19/18 1139  BP: (!) 81/72 (!) 84/65  Pulse:    Resp: 17 12  Temp: 36.5 C (!) 36.4 C  SpO2: 98% 100%    Last Pain:  Vitals:   05/19/18 1139  TempSrc: Oral  PainSc:                  [name]

## 2018-05-19 NOTE — Progress Notes (Signed)
Urology Inpatient Progress Report  BLADDER CANCER  Procedure(s): TRANSURETHRAL RESECTION OF BLADDER TUMOR (TURBT) CYSTOSCOPY WITH RETROGRADE PYELOGRAM  3 Days Post-Op   Intv/Subj: Patient creatinine worsened this morning.  He therefore underwent a renal ultrasound which revealed no hydronephrosis bilaterally.  KUB was also performed which showed interval migration of the right ureteral stent.  He subsequently accidentally pulled out his ureteral stent.  He denies any pain, nausea, vomiting.  He overall feels well.  Urine is very light red.  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   Active Problems:   LVAD (left ventricular assist device) present ([place])   Complication involving left ventricular assist device (LVAD)   Malnutrition of moderate degree  Current Facility-Administered Medications  Medication Dose Route Frequency Provider Last Rate Last Dose  . acetaminophen (TYLENOL) tablet 650 mg  650 mg Oral Q4H PRN Georgiana Shore, NP   650 mg at 05/18/18 1884  . amiodarone (PACERONE) tablet 200 mg  200 mg Oral Daily Georgiana Shore, NP   200 mg at 05/19/18 0913  . atorvastatin (LIPITOR) tablet 80 mg  80 mg Oral q1800 Georgiana Shore, NP   80 mg at 05/18/18 1653  . docusate sodium (COLACE) capsule 200 mg  200 mg Oral Daily PRN Georgiana Shore, NP      . lactated ringers infusion   Intravenous Continuous Duane Boston, MD 10 mL/hr at 05/16/18 1152    . levothyroxine (SYNTHROID, LEVOTHROID) tablet 150 mcg  150 mcg Oral QAC breakfast Georgiana Shore, NP   150 mcg at 05/19/18 1660  . multivitamin with minerals tablet 1 tablet  1 tablet Oral Daily Georgiana Shore, NP   1 tablet at 05/19/18 0913  . mupirocin ointment (BACTROBAN) 2 % 1 application  1 application Nasal BID Larey Dresser, MD   1 application at 63/01/60 [phone]  . ondansetron (ZOFRAN) injection 4 mg  4 mg Intravenous Q6H PRN Georgiana Shore, NP      . pantoprazole (PROTONIX) EC tablet 40  mg  40 mg Oral BID Georgiana Shore, NP   40 mg at 05/19/18 0913  . piperacillin-tazobactam (ZOSYN) IVPB 3.375 g  3.375 g Intravenous Q8H MancherilDarnell Level, RPH 12.5 mL/hr at 05/19/18 1549 3.375 g at 05/19/18 1549  . promethazine (PHENERGAN) tablet 12.5 mg  12.5 mg Oral Q6H PRN Georgiana Shore, NP      . ranolazine (RANEXA) 12 hr tablet 500 mg  500 mg Oral BID Georgiana Shore, NP   500 mg at 05/19/18 0913  . sildenafil (REVATIO) tablet 20 mg  20 mg Oral TID Georgiana Shore, NP   20 mg at 05/19/18 1546  . temazepam (RESTORIL) capsule 30 mg  30 mg Oral QHS PRN Georgiana Shore, NP      . vancomycin (VANCOCIN) IVPB 1000 mg/200 mL premix  1,000 mg Intravenous Q24H Lavenia Atlas, RPH 200 mL/hr at 05/18/18 1655 1,000 mg at 05/18/18 1655  . warfarin (COUMADIN) tablet 2 mg  2 mg Oral ONCE-1800 Einar Grad, [facility]      . Warfarin - Pharmacist Dosing Inpatient   Does not apply q1800 Clegg, Amy D, NP         Objective: Vital: Vitals:   05/19/18 0500 05/19/18 1057 05/19/18 1139 05/19/18 1637  BP:  (!) 81/72 (!) 84/65 102/82  Pulse:      Resp:  17 12 17   Temp:  97.7 F (36.5 C) (!) 97.5  F (36.4 C) 97.8 F (36.6 C)  TempSrc:  Oral Oral Oral  SpO2:  98% 100% 94%  Weight: 81.1 kg (178 lb 12.7 oz)     Height:       I/Os: I/O last 3 completed shifts: In: 1026.2 [P.O.:600; I.V.:126.2; IV Piggyback:300] Out: 1000 [Urine:1000]  Physical Exam:  General: Patient is in no apparent distress Lungs: Normal respiratory effort, chest expands symmetrically. GI: The abdomen is soft and nontender without mass. GU: Light red urine Ext: lower extremities symmetric  Lab Results: Recent Labs    05/17/18 0253 05/18/18 0237 05/19/18 0254  WBC 5.4 5.3 6.7  HGB 8.1* 7.9* 8.0*  HCT 27.3* 26.9* 25.9*   Recent Labs    05/17/18 0253 05/18/18 0237 05/19/18 0254  NA 138 138 134*  K 4.9 4.6 4.4  CL 106 108 104  CO2 26 24 23   GLUCOSE 118* 102* 125*  BUN 24* 24* 26*  CREATININE 1.84* 1.87*  2.15*  CALCIUM 8.5* 8.5* 8.4*   Recent Labs    05/17/18 0253 05/18/18 0237 05/19/18 0254  INR 1.67 2.02 2.46   No results for input(s): LABURIN in the last 72 hours. Results for orders placed or performed during the hospital encounter of 05/14/18  MRSA PCR Screening     Status: Abnormal   Collection Time: 05/14/18 11:26 AM  Result Value Ref Range Status   MRSA by PCR POSITIVE (A) NEGATIVE Final    Comment:        The GeneXpert MRSA Assay (FDA approved for NASAL specimens only), is one component of a comprehensive MRSA colonization surveillance program. It is not intended to diagnose MRSA infection nor to guide or monitor treatment for MRSA infections. RESULT CALLED TO, READ BACK BY AND VERIFIED WITH: Nichola Sizer RN 13:30 05/14/18 ([email or web address]) Performed at [facility], [place] [address] [place], [place] [address]   Culture, blood (Routine X 2) w Reflex to ID Panel     Status: None (Preliminary result)   Collection Time: 05/17/18  3:54 PM  Result Value Ref Range Status   Specimen Description BLOOD LEFT ARM  Final   Special Requests   Final    BOTTLES DRAWN AEROBIC AND ANAEROBIC Blood Culture adequate volume   Culture   Final    NO GROWTH 2 DAYS Performed at [facility], [place] [address], [place], [place] [address]    Report Status PENDING  Incomplete  Culture, blood (Routine X 2) w Reflex to ID Panel     Status: None (Preliminary result)   Collection Time: 05/17/18  4:05 PM  Result Value Ref Range Status   Specimen Description BLOOD LEFT HAND  Final   Special Requests   Final    BOTTLES DRAWN AEROBIC ONLY Blood Culture results may not be optimal due to an inadequate volume of blood received in culture bottles   Culture   Final    NO GROWTH 2 DAYS Performed at [facility], [place] [address], [place], [place] [address]    Report Status PENDING  Incomplete  Culture, Urine     Status: None   Collection Time: 05/17/18  5:21 PM  Result Value Ref Range Status    Specimen Description URINE, CLEAN CATCH  Final   Special Requests NONE  Final   Culture   Final    NO GROWTH Performed at [facility], [place] [address] [place],  [address]    Report Status 05/18/2018 FINAL  Final   *  Note: Due to a large number of results and/or encounters for the requested time period, some results have not been displayed. A complete set of results can be found in Results Review.    Studies/Results: US Renal  Result Date: 05/19/2018 CLINICAL DATA:  Acute renal insufficiency EXAM: RENAL / URINARY TRACT ULTRASOUND COMPLETE COMPARISON:  CT abdomen and pelvis December 14, 2017 FINDINGS: Right Kidney: Length: 11.4 cm. Echogenicity and renal cortical thickness are within normal limits. No perinephric fluid or hydronephrosis visualized. There is a cyst arising from the mid right kidney measuring 6.8 x 6.0 x 7.4 cm. A second nearby cyst measures 2.5 x 2.0 x 2.2 cm. Left Kidney: Length: 11.5 cm. Echogenicity and renal cortical thickness are within normal limits. No perinephric fluid or hydronephrosis visualized. There is a cyst in the mid left kidney measuring 3.3 x 3.1 x 3.5 cm with a nearby cyst measuring 1.4 x 1.5 x 1.9 cm. There is calcification along the periphery of this smaller cyst, also present on prior CT. There are several smaller cysts throughout the left kidney is well. A cyst arising from the lower pole measures 1.3 x 1.3 x 1.1 cm. There are several lower pole subcentimeter cysts. No sonographically demonstrable calculus or ureterectasis. Bladder: There is wall irregularity along the inferior aspect of the urinary bladder. IMPRESSION: 1. Wall irregularity along the inferior aspect of the urinary bladder. Suspect cystitis. A mass lesion within this thickening cannot be entirely excluded. This finding may warrant direct visualization of the urinary bladder. 2. Cystic lesions in both kidneys. A cyst in the mid left kidney shows calcification in its peripheral wall,  similar to recent CT. 3. No hydronephrosis on either side. The renal cortical thickness and renal echogenicity are within normal limits bilaterally. Electronically Signed   By: Lowella Grip III M.D.   On: 05/19/2018 08:37   Dg Chest Port 1 View  Result Date: 05/18/2018 CLINICAL DATA:  Encounter for pneumonia. EXAM: PORTABLE CHEST 1 VIEW COMPARISON:  04/01/18 FINDINGS: There is a left chest wall ICD with leads in the right atrial appendage and right ventricle. A left ventricular assist device is noted. Stable cardiac enlargement and aortic atherosclerosis. Chronic interstitial coarsening is identified bilaterally. No superimposed airspace consolidation. IMPRESSION: 1. No airspace opacities identified to suggest pneumonia. 2. Cardiac enlargement with stable appearance of ICD and LVAD. Electronically Signed   By: Kerby Moors M.D.   On: 05/18/2018 12:20   Dg Abd Portable 1v  Result Date: 05/19/2018 CLINICAL DATA:  Ureteral stent placement. EXAM: PORTABLE ABDOMEN - 1 VIEW COMPARISON:  KUB 05/19/2018. FINDINGS: Previously seen right double-J ureteral stent is no longer visualized. Multiple wires overlie the right upper quadrant of the abdomen. Atherosclerotic calcifications are noted. IMPRESSION: Previously seen right ureteral stent is no longer visualized. No acute finding. Electronically Signed   By: Inge Rise M.D.   On: 05/19/2018 15:49   Dg Abd Portable 1v  Result Date: 05/19/2018 CLINICAL DATA:  Right ureteral stent placement.  Hematuria. EXAM: PORTABLE ABDOMEN - 1 VIEW COMPARISON:  CT abdomen and pelvis December 14, 2017 FINDINGS: The double-J stent has its proximal aspect at the level of L3-4 on the right, presumably in the proximal ureter. The distal aspect of the stent is at the level of the proximal urethra. There is no bowel dilatation or air-fluid level to suggest bowel obstruction. No free air. Left ventricular assist device present. There are postoperative changes in the lower lumbar  spine. There is extensive arterial vascular calcification in  the iliac and femoral artery regions. IMPRESSION: Double-J stent on the right has its proximal aspect inferior to the right renal pelvis, located at L3-4 in the right ureteral region. The distal aspect of the stent is felt to be in the proximal urethra region based on frontal view only assessment. Bowel gas pattern unremarkable. Extensive arterial vascular calcification noted. These results will be called to the ordering clinician or representative by the Radiologist Assistant, and communication documented in the PACS or zVision Dashboard. Electronically Signed   By: Lowella Grip III M.D.   On: 05/19/2018 09:15    Assessment: Bladder cancer status post TURBT Acute renal insufficiency  Procedure(s): TRANSURETHRAL RESECTION OF BLADDER TUMOR (TURBT) CYSTOSCOPY WITH RETROGRADE PYELOGRAM, 3 Days Post-Op  doing well.  Plan: Bladder cancer recurrence was high-grade without any evidence of invasion.  He did not tolerate BCG well at all with a reduced dose.  Therefore he does not wish to pursue intravesical BCG any further which I agree with.  Also talked him about intravesical chemotherapy and he also would not like to pursue this.  He is not a candidate for cystectomy.  Therefore he will follow-up in 3 months with a cystoscopy for bladder cancer surveillance.  There is no hydronephrosis on ultrasound.  This points toward prerenal cause such as ATN rather than obstructive cause for acute renal insufficiency.  Ureteral stent has been removed.     Link Snuffer, MD Urology 05/19/2018, 5:02 PM

## 2018-05-19 NOTE — Progress Notes (Addendum)
[place] for Coumadin Indication: LVAD  Allergies  Allergen Reactions  . Demerol [Meperidine] Other (See Comments)    Paralysis/ Could only move eyes    Patient Measurements: Ht: 6' Wt: 78.6 kg Heparin Dosing Weight: 78.6 kg  Vital Signs: Temp: 98.6 F (37 C) (07/15 0400) Temp Source: Oral (07/15 0400) BP: 93/77 (07/15 0400)  Labs: Recent Labs    05/17/18 0253 05/17/18 0718 05/18/18 0237 05/19/18 0254  HGB 8.1*  --  7.9* 8.0*  HCT 27.3*  --  26.9* 25.9*  PLT 159  --  159 132*  LABPROT 19.6*  --  22.7* 26.5*  INR 1.67  --  2.02 2.46  HEPARINUNFRC 0.15* 0.29* 0.42  --   CREATININE 1.84*  --  1.87* 2.15*    Estimated Creatinine Clearance: 31.1 mL/min (A) (by C-G formula based on SCr of 2.15 mg/dL (H)).   . lactated ringers 10 mL/hr at 05/16/18 1152  . piperacillin-tazobactam (ZOSYN)  IV 3.375 g (05/19/18 0918)  . vancomycin 1,000 mg (05/18/18 1655)     Assessment: 89 yoM presenting for planned bladder tumor resection with hx of LVAD HM II on warfarin PTA. Warfarin now resumed post/op and INR therapeutic at 2.46, Hgb and LDH stable. Hematuria continues post-procedure as expected per urology.  **PTA Warfarin Dose = 2mg  daily  Goal of Therapy:  Heparin level 0.3-0.5 units/ml INR 2-2.5  Monitor platelets by anticoagulation protocol: Yes   Plan:  -Warfarin 2mg  PO x1 tonight - may need to give reduced dose tomorrow -Daily INR  Arrie Senate, PharmD, BCPS Clinical Pharmacist [phone] Please check AMION for all [place] numbers 05/19/2018

## 2018-05-19 NOTE — Progress Notes (Signed)
LVAD Coordinator Rounding Note:  Admitted 05/14/18 due for heparin bridge in prep for bladder tumor resection on 05/16/18.[name]   HM II LVAD implanted on 10/19/13 by Dr. Cyndia Bent with oversewn aortic valveunder DT criteria.   Vital signs: Temp: 97.7 HR:64 Doppler Pressure:84 Automatic BP:81/72 (95) O2 Sat: 98% on RA   Weight: 173>178lbs   LVAD interrogation reveals:  Speed: 8800 Flow:3.8 Power: 3.6w PI: 6.6 Alarms: none Events:none  Fixed speed: 8800 Low speed limit: 8200  Drive Line: Sorbaview dressing dry and intact; anchor intact and accurately applied. Weekly dressing changes.Next change is due 05/25/18. May be changed by [facility] or bedside nurse.  Labs:  LDH trend: 237>226>240  INR trend: 1.84>1.71>2.46  Creat trend: 1.54>1.61>2.15  Anticoagulation Plan: - INR Goal: 2.0 - 2.5 - ASA Dose: no ASA (removed 02/2017 due to GI bleed)  Device: -Dual Medtronic ICD -Therapies: on at 200 bpm - Last device check 10/02/17  Significant Events on VAD Support:  09/01/14>>ureteral stone with cystoscopy and flexible ureteroscopy with lithotripsy and basket extraction 02/2015 >>GIB with 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 Started on Plavix 3/17>>Lumbar fusion L3-L5 9/17>>VT with DC-CV; amio increased 10/17>>ramp with RV failure; speed decreased to 8800 02/2017> GIB (removed ASA, scopes negative) 03/2017>percutaneous lead repair after pump stops  10/18>>Slow VT. Cardioverted in ED and amiodarone uptitrated 12/18>>melena with scope. Hgb 6.5. EGD with 2 AVMs with APC 12/18>>recurrent hematuria. 6 mm left ureteral stone 1/19>>7 mm left ureteral stone removal with laser lithotripsy; left ureteral stent. Resection of 6 cm bladder tumor   Plan/Recommendations:   1. Please call VAD pager if any  issues with VAD equipment or questions, concerns. 2. VAD coordinator will accompany pt to the OR today.  Tanda Rockers RN, VAD Coordinator 24/7 pager [phone]

## 2018-05-19 NOTE — Progress Notes (Addendum)
Patient ID: [name], male   DOB: October 26, 1941, 77 y.o.   MRN: [id number]   Advanced Heart Failure VAD Team Note  PCP-Cardiologist: No primary care provider on file.   Subjective:    7/12 had TURB + right ureteral stent.   INR 2.46. Creatinine elevated 2.15. Hemoglobin 8.0, WBC 6.7  Afebrile. Urine culture negative. Blood cultures pending. Remains on Vanc/Zosyn.  He still has hematuria. Denies fever/chills. Had an episode of weakness yesterday, but no other complaints. Denies SOB.   LVAD INTERROGATION:  HeartMate 2 LVAD:   Flow 3.6 liters/min, speed 8800, power 5, PI 5.8.  No PI events.    Objective:    Vital Signs:   Temp:  [97.8 F (36.6 C)-98.6 F (37 C)] 98.6 F (37 C) (07/15 0400) Pulse Rate:  [60] 60 (07/14 1232) Resp:  [14-17] 16 (07/15 0000) BP: (80-98)/(69-86) 93/77 (07/15 0400) SpO2:  [95 %-98 %] 97 % (07/15 0000) Weight:  [178 lb 12.7 oz (81.1 kg)] 178 lb 12.7 oz (81.1 kg) (07/15 0500) Last BM Date: 05/17/18 Mean arterial Pressure 70-80s  Intake/Output:   Intake/Output Summary (Last 24 hours) at 05/19/2018 0715 Last data filed at 05/19/2018 0500 Gross per 24 hour  Intake 850 ml  Output 825 ml  Net 25 ml     Physical Exam    General: Well appearing this am. NAD.  HEENT: Normal. Neck: Supple, JVP ~12 cm. Carotids OK.  Cardiac:  Mechanical heart sounds with LVAD hum present.  Lungs:  CTAB, normal effort.  Abdomen:  NT, ND, no HSM. No bruits or masses. +BS  LVAD exit site:  Dressing dry and intact. No erythema or drainage. Stabilization device present and accurately applied. . Extremities:  Warm and dry. No cyanosis, clubbing, rash, or edema.  Neuro:  Alert & oriented x 3. Cranial nerves grossly intact. Moves all 4 extremities w/o difficulty. Affect pleasant     Telemetry   Apaced 60s. Personally reviewed.   EKG    No new tracings.   Labs   Basic Metabolic Panel: Recent Labs  Lab 05/15/18 0638 05/16/18 0603 05/17/18 0253  05/18/18 0237 05/19/18 0254  NA 138 138 138 138 134*  K 3.9 4.3 4.9 4.6 4.4  CL 104 104 106 108 104  CO2 23 25 26 24 23   GLUCOSE 109* 105* 118* 102* 125*  BUN 30* 29* 24* 24* 26*  CREATININE 1.54* 1.61* 1.84* 1.87* 2.15*  CALCIUM 8.7* 8.9 8.5* 8.5* 8.4*  MG 2.2 2.1 2.1 1.9 2.2    Liver Function Tests: Recent Labs  Lab 05/14/18 1030  AST 24  ALT 14  ALKPHOS 140*  BILITOT 0.7  PROT 7.6  ALBUMIN 3.5   No results for input(s): LIPASE, AMYLASE in the last 168 hours. No results for input(s): AMMONIA in the last 168 hours.  CBC: Recent Labs  Lab 05/14/18 1030 05/15/18 1856 05/16/18 0603 05/17/18 0253 05/18/18 0237 05/19/18 0254  WBC 5.3 5.4 5.4 5.4 5.3 6.7  NEUTROABS 3.8  --   --   --   --   --   HGB 9.3* 8.9* 8.6* 8.1* 7.9* 8.0*  HCT 31.9* 30.1* 28.5* 27.3* 26.9* 25.9*  MCV 82.4 82.5 81.4 83.2 83.8 82.0  PLT 203 207 206 159 159 132*    INR: Recent Labs  Lab 05/15/18 0638 05/16/18 0603 05/17/18 0253 05/18/18 0237 05/19/18 0254  INR 1.84 1.71 1.67 2.02 2.46    Other results:  EKG:    Imaging   Dg Chest [facility]  1 View  Result Date: 05/18/2018 CLINICAL DATA:  Encounter for pneumonia. EXAM: PORTABLE CHEST 1 VIEW COMPARISON:  04/01/18 FINDINGS: There is a left chest wall ICD with leads in the right atrial appendage and right ventricle. A left ventricular assist device is noted. Stable cardiac enlargement and aortic atherosclerosis. Chronic interstitial coarsening is identified bilaterally. No superimposed airspace consolidation. IMPRESSION: 1. No airspace opacities identified to suggest pneumonia. 2. Cardiac enlargement with stable appearance of ICD and LVAD. Electronically Signed   By: Kerby Moors M.D.   On: 05/18/2018 12:20     Medications:     Scheduled Medications: . amiodarone  200 mg Oral Daily  . atorvastatin  80 mg Oral q1800  . levothyroxine  150 mcg Oral QAC breakfast  . multivitamin with minerals  1 tablet Oral Daily  . mupirocin ointment  1  application Nasal BID  . pantoprazole  40 mg Oral BID  . ranolazine  500 mg Oral BID  . sildenafil  20 mg Oral TID  . Warfarin - Pharmacist Dosing Inpatient   Does not apply q1800    Infusions: . lactated ringers 10 mL/hr at 05/16/18 1152  . piperacillin-tazobactam (ZOSYN)  IV 3.375 g (05/18/18 2334)  . vancomycin 1,000 mg (05/18/18 1655)    PRN Medications: acetaminophen, docusate sodium, ondansetron (ZOFRAN) IV, promethazine, temazepam   Patient Profile   LEOPOLDO MAZZIE is a 77 y.o. 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 VAD implantation was complicated by VT, syncope, AF/Aflutter and RHF. Required short term milrinone.  Admitted for optimization and heparin bridge prior to bladder surgery on Friday.   Assessment/Plan   1. Chronic systolic HF: s/p HM-II LVAD 10/2014. [place] in 1/19 with mildly low output. Started on milrinone without benefit so it was stopped. Takes PRN lasix at home.  VAD interrogated personally. Parameters stable.No change. s/p previous driveline repair 0/9323. No further pump stops - Volume status elevated today. Up 5 lbs from admit weight. Creatinine trending up - Give 40 mg IV lasix today.  2. VT: Underwent DC-CV 9/17, 08/17/17, and 04/01/18 -Continue amiodarone 200 mg daily  - Keep K > 4.0 Mg > 2.0. No change.  3. Recurrent renal stone and bladder tumor: S/p stone removal. 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 Had 2 BCG infusions with Dr. Gloriann Loan but discontinued due to inability to tolerate.  Now with recurrent tumor and s/p TURB with right ureteral stent placement on 7/12.  Hematuria still present, likely result of procedure.  - Will need ureteral stent removed Monday or Tuesday, can be done with therapeutic INR. Per urology.  - Concern for obstruction post procedure. Will get renal US. Discussed with Dr  Aundra Dubin. Will discuss with urology 4. CKD, stage III: Had obstructive uropathy and ATN. Required HD x1 (11/2017).  Creatinine higher again today to 2.15.  Has ureteral stent on right.  5. 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  Admitted 4/18 with recurrent GIB. Transfused 2u. EGD/colon negative.  Admitted 12/18 with hgb 6.5, Transfused 5u. EGD with 2 AVMs treated with APC.  - Had been getting octreotide infusions but too expensive for him. Willcontinue to hold for now - Hemoglobin 9.3 > 8.9 > 8.6 > 8.1>7.9>8.0.  No overt GI bleeding, may be related to hematuria/GU procedure. - Continue warfarin 6. Atrial arrhythmias: Atrial fibrillation, atrial tachycardia. S/p  DC-CV 06/04/14, DC-CV 03/02/15 and 01/19/16.  - Remains in NSR/a-paced. No change.  - Continue amiodarone 200 mg daily 7. HTN: MAP 70-80s 8. LVAD: VAD interrogated personally. Parameters stable. LDH stable.  9. Anticoagulation management:  - Goal INR 2-2.5 - Continue warfarin. INR 2.46 10. Lumbar compression fracture:s/p L3-L5 fusion 02/03/16. - Resolved. No change.  11. 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 Continue atorvastatin. (and warfarin INR goal 2-2.5). S/p R CEA 01/13/16. Plavix stopped 12/18 due to recurrent GIB. No change. 12. H/o amio-induced hyperthyroidism: Followed by Dr. Forde Dandy in past. Amio restarted due to recurrent atrial arrhythmias and VT. Recent FT4 elevated. Synthroid cut to 150 daily in 12/18. TSH elevated 10.125, but T4 normal 1.05.  No changes. 13. ID: Fever 7/13, WBCs not elevated. Suspect related to urinary tract instrumentation.  - Urine culture negative. Blood cultures pending. Remains afebrile.  - CXR negative.  - Continue empiric vanc/Zosyn pending cultures.   Ureter stent to be removed today or tomorrow per urology.   I reviewed the LVAD parameters from today, and compared the results to the patient's prior recorded data.  No programming changes were made.  The LVAD is  functioning within specified parameters.  The patient performs LVAD self-test daily.  LVAD interrogation was negative for any significant power changes, alarms or PI events/speed drops.  LVAD equipment check completed and is in good working order.  Back-up equipment present.   LVAD education done on emergency procedures and precautions and reviewed exit site care.  Length of Stay: Ida Grove, NP 05/19/2018, 7:15 AM  VAD Team --- VAD ISSUES ONLY--- Pager [phone] (7am - 7am)  Advanced Heart Failure Team  Pager [phone] (M-F; 7a - 4p)  Please contact [facility] for night-coverage after hours (4p -7a ) and weekends on [email or web address]  Patient seen with NP, agree with the above note.  INR therapeutic today and hemoglobin has stabilized.  Still with hematuria post-procedure.  Creatinine up to 2.1. Weight also trending up and appears to have some volume overload by exam.  LVAD parameters stable.  No complaints.  We did a renal ultrasound today which showed no hydronephrosis.  Bladder findings will need to be interpreted by the urologist.  There was also an abdominal XR which questioned the position of the ureteral stent, also will await input from urology.   With higher weight and volume overload on exam and no evidence for hydronephrosis, will give a dose of Lasix 40 mg IV today.   Afebrile.  Continue vanc/Zosyn today empirically pending culture data.  If remains afebrile with normal WBCs, will stop abx at 5 days.   Loralie Champagne 05/19/2018 9:50 AM

## 2018-05-19 NOTE — Progress Notes (Signed)
RN called into patient's room, patient stated that while bathing himself he accidentally pulled ureteral stent out a bit. Gloriann Loan, MD called with no answer. Urology MD on call paged with no answer. Pt denies any pain or discomfort since. RN will continue to monitor.

## 2018-05-19 NOTE — Discharge Summary (Signed)
Advanced Heart Failure Team  Discharge Summary   Patient ID: KRISHAWN VANDERWEELE MRN: [id number], DOB/AGE: Dec 06, 1940 77 y.o. Admit date: 05/14/2018 D/C date:     05/21/2018   Primary Discharge Diagnoses:  1. Chronic systolic HF - S/P HM II LVAD 10/2014.  2. VT - S/p DCCV 9/17, 08/17/17, and 04/01/18 3. Recurrent renal stone and bladder tumor - S/p TURB with right ureteral stent placement 05/16/18 4. AKI on CKD 3 5. Recurrent GIB 6. Atrial arrhythmias  - S/p DC-CV 06/04/14, 03/02/15 and 01/19/16 7. HTN 8. LVAD: HM 2 - On coumadin. No ASA. - INR goal 2-2.5.  9. Hx of lumbar compression fracture 11. Hx of CVA 12. Hx of amio-induced hypothyroidism  13. ID  Hospital Course: Garlin Batdorf McBrideis a 77 y.o.malewith h/o VT, PAD and severe systolic HF (EF 79-02%) due to mixed ischemic/nonischemic CM he is s/p HM II LVAD implant for destination therapy on October 19, 2013. VAD implantation was complicated by VT, syncope, AF/Aflutter and RHF. Required short term milrinone.  Admitted for heparin bridge prior to bladder tumor resection. Resection completed on 7/12. Course was complicated by fever. WBC were normal. Started on Vanc/Zosyn empirically. Blood cultures were negative. He also had AKI. Renal US and abdominal XR obtained and reviewed by urologist, not thought to have obstruction. Creatinine trending down by day of discharge. He also received IV iron and 1 unit PRBC on day of discharge with slow downtrending of hemoglobin, thought to be related to expected hematuria.   1. Chronic systolic HF: s/p HM-II LVAD 10/2014. [place] in 1/19 with mildly low output. Started on milrinone without benefitso it was stopped. s/p previous driveline [id number]. No further pump stops - Volume managed with prn IV lasix. Back to baseline weight prior to discharge.  2. VT: Underwent DC-CV 9/17, 08/17/17, and 04/01/18 -Continued amiodarone 200 mg daily. No VT. Electrolytes were monitored.  3. Recurrent renal stone  and bladder tumor: S/p stone removal. 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 Had 2 BCG infusions with Dr. Gloriann Loan but discontinued due to inability to tolerate.  Nowwith recurrent tumor and s/p TURB with right ureteral stent placement on 7/12. He had expected hematuria post procedure.  - Ureteral stent removed.  - Concern for obstruction post procedure. Renal US and abdominal Xray obtained, which were negative for acute changes/hydronephrosis. - Per urology, he will need repeat cystoscopy in 3 months.  4. AKI on CKD, stage III: Had obstructive uropathy and ATN. Required HD x1(11/2017). Creatinine peaked at 2.15, but trended down to 1.97 on day of discharge. 5. 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  Admitted 4/18 with recurrent GIB. Transfused 2u. EGD/colon negative.  Admitted 12/18 with hgb 6.5, Transfused 5u. EGD with 2 AVMs treated with APC.  - Hadbeen getting octreotide infusions but too expensive for him. Willcontinue to hold for now - CBC monitored closely. Hemoglobin slowly trended down to 7.7 on day of discharge, so he received 1 unit pRBCs prior to discharge. He also received feraheme for low iron.  - Warfarin restarted post procedure with no complications.  6. Atrial arrhythmias: Atrial fibrillation, atrial tachycardia. S/p DC-CV 06/04/14, DC-CV 03/02/15 and 01/19/16. - Remained in NSR/a-paced.  - Continueamiodarone 200 mg daily 7. HTN: MAPs remained stable.  8. LVAD:  Parameters and LDH remained stable.  9. Anticoagulation management:  - Goal INR 2-2.5 - Warfarin resumed post procedure with no complications.  10. Lumbar compression fracture:s/p L3-L5 fusion 02/03/16. -Resolved  11. CVA:  12/16 with left-sided weakness, now resolved.Continue atorvastatin. (andwarfarin INR goal 2-2.5). S/p R CEA 01/13/16. Plavix stopped 12/18 due to recurrent GIB.  12. H/o amio-induced hyperthyroidism: Followed by Dr. Forde Dandy in past. Amio restarted due to recurrent  atrial arrhythmias and VT. Recent FT4 elevated. Synthroid cut to 150 daily in 12/18.TSH elevated 10.125, but T4 normal 1.05.  13. ID: Fever 7/13, WBCs not elevated. Suspect related to urinary tract instrumentation.  - Urine and blood cultures negative.  - CXR negative.  - Received empiric Vanc/Zosyn for 5 days. Remained afebrile.   He will be followed closely in the VAD clinic, with appointment as below. He will check his PT/INR in 1 week. He will come to HF clinic for CBC and BMET next week.   LVAD Interrogation HM II:   Speed: 8800    Flow: 3.7     PI: 7.8      Power: 4      Back-up speed: 8200      Discharge Weight: 172 lbs Discharge Vitals: Blood pressure 105/85, pulse 62, temperature 98.6 F (37 C), temperature source Oral, resp. rate 15, height 6' 0.01" (1.829 m), weight 172 lb 4.8 oz (78.2 kg), SpO2 93 %.  Labs: Lab Results  Component Value Date   WBC 4.8 05/21/2018   HGB 7.7 (L) 05/21/2018   HCT 25.8 (L) 05/21/2018   MCV 82.2 05/21/2018   PLT 160 05/21/2018    Recent Labs  Lab 05/21/18 0317  NA 134*  K 4.2  CL 100  CO2 26  BUN 21  CREATININE 1.96*  CALCIUM 8.3*  GLUCOSE 104*   Lab Results  Component Value Date   CHOL 106 11/05/2015   HDL 33 (L) 11/05/2015   LDLCALC 60 11/05/2015   TRIG 67 11/05/2015   BNP (last 3 results) Recent Labs    08/17/17 1900  BNP 627.8*    ProBNP (last 3 results) No results for input(s): PROBNP in the last 8760 hours.   Diagnostic Studies/Procedures   Dg Abd Portable 1v  Result Date: 05/19/2018 CLINICAL DATA:  Ureteral stent placement. EXAM: PORTABLE ABDOMEN - 1 VIEW COMPARISON:  KUB 05/19/2018. FINDINGS: Previously seen right double-J ureteral stent is no longer visualized. Multiple wires overlie the right upper quadrant of the abdomen. Atherosclerotic calcifications are noted. IMPRESSION: Previously seen right ureteral stent is no longer visualized. No acute finding. Electronically Signed   By: Inge Rise M.D.   On:  05/19/2018 15:49    Discharge Medications   Allergies as of 05/21/2018      Reactions   Demerol [meperidine] Other (See Comments)   Paralysis/ Could only move eyes      Medication List    TAKE these medications   amiodarone 200 MG tablet Commonly known as:  PACERONE Take 200 mg by mouth daily.   atorvastatin 80 MG tablet Commonly known as:  LIPITOR TAKE 1 TABLET DAILY AT 6 PM What changed:  See the new instructions.   CORICIDIN COUGH/COLD 4-30 MG Tabs Generic drug:  Chlorpheniramine-DM Take 1 tablet by mouth every 6 (six) hours as needed (for congestion and coughing).   docusate sodium 100 MG capsule Commonly known as:  COLACE Take 200 mg by mouth daily.   furosemide 20 MG tablet Commonly known as:  LASIX Take 20 mg by mouth daily as needed for fluid.   ICAPS PO Take 1 tablet by mouth 2 (two) times daily.   MULTIVITAMIN PO Take 1 tablet by mouth daily.   levothyroxine 150 MCG tablet  Commonly known as:  SYNTHROID, LEVOTHROID Take 1 tablet (150 mcg total) by mouth daily before breakfast.   pantoprazole 40 MG tablet Commonly known as:  PROTONIX TAKE 1 TABLET TWICE DAILY   promethazine 12.5 MG tablet Commonly known as:  PHENERGAN Take 12.5 mg by mouth every 6 (six) hours as needed for nausea or vomiting.   ranolazine 500 MG 12 hr tablet Commonly known as:  RANEXA Take 1 tablet (500 mg total) by mouth 2 (two) times daily.   ROBITUSSIN COUGH+CHEST CONG DM PO Take 20 mLs by mouth every 6 (six) hours as needed (for congestion and coughing).   sildenafil 20 MG tablet Commonly known as:  REVATIO Take 1 tablet (20 mg total) by mouth 3 (three) times daily.   temazepam 30 MG capsule Commonly known as:  RESTORIL Take 1 capsule (30 mg total) by mouth at bedtime as needed for sleep.   warfarin 2 MG tablet Commonly known as:  COUMADIN Take as directed. If you are unsure how to take this medication, talk to your nurse or doctor. Original instructions:  Take 2 mg by  mouth every evening.   zinc gluconate 50 MG tablet Take 50 mg by mouth 2 (two) times daily.       Disposition   The patient will be discharged in stable condition to home. Discharge Instructions    (HEART FAILURE PATIENTS) Call MD:  Anytime you have any of the following symptoms: 1) 3 pound weight gain in 24 hours or 5 pounds in 1 week 2) shortness of breath, with or without a dry hacking cough 3) swelling in the hands, feet or stomach 4) if you have to sleep on extra pillows at night in order to breathe.   Complete by:  As directed    Call MD for:  persistant dizziness or light-headedness   Complete by:  As directed    Call MD for:  temperature >100.4   Complete by:  As directed    Diet - low sodium heart healthy   Complete by:  As directed    Discharge instructions   Complete by:  As directed    Please check PT/INT on Tuesday or Wednesday next week and contact VAD clinic with results.   Heart Failure patients record your daily weight using the same scale at the same time of day   Complete by:  As directed    INR  Goal: 2 - 2.5   Complete by:  As directed    Goal:  2 - 2.5   Increase activity slowly   Complete by:  As directed    Page VAD Coordinator at [phone]  Notify for: any VAD alarms, sustained elevations of power >10 watts, sustained drop in Pulse Index <3   Complete by:  As directed    Notify for:   any VAD alarms sustained elevations of power >10 watts sustained drop in Pulse Index <3     STOP any activity that causes chest pain, shortness of breath, dizziness, sweating, or exessive weakness   Complete by:  As directed    Speed Settings:   Complete by:  As directed    Fixed 8800 RPM Low 8200 RPM     Follow-up Information    [facility] Follow up on 06/10/2018.   Specialty:  Cardiology Why:  10 am. VAD follow up. Garage code: [id number].  Contact information: [address] [id number] [place] [phone]  [place] Follow up on 05/27/2018.   Why:  LABS ONLY IN LVAD CLINIC AT 9:00AM Contact information: [place] [phone]            Duration of Discharge Encounter: Greater than 35 minutes   Signed, Georgiana Shore, NP  05/21/2018, 11:50 AM

## 2018-05-19 NOTE — Progress Notes (Signed)
RN called by radiology with results of xray for stent placement. Gloriann Loan, MD called and made aware of xray results.   Basil Dess, RN

## 2018-05-19 NOTE — Progress Notes (Signed)
Pt stood up from chair to get back to bed for xray and pt and RN discovered ureteral stent in chair. On call urologist, Jeffie Pollock, MD paged and updated. Jeffie Pollock, MD stated that he would update primary urologist, Gloriann Loan, MD. Pt denies any pain and no evidence of bleeding at site. RN will continue to monitor.

## 2018-05-20 LAB — BASIC METABOLIC PANEL
Anion gap: 7 (ref 5–15)
BUN: 23 mg/dL (ref 8–23)
CHLORIDE: 103 mmol/L (ref 98–111)
CO2: 25 mmol/L (ref 22–32)
Calcium: 8.4 mg/dL — ABNORMAL LOW (ref 8.9–10.3)
Creatinine, Ser: 2.04 mg/dL — ABNORMAL HIGH (ref 0.61–1.24)
GFR calc non Af Amer: 30 mL/min — ABNORMAL LOW (ref >60)
GFR, EST AFRICAN AMERICAN: 34 mL/min — AB (ref >60)
Glucose, Bld: 104 mg/dL — ABNORMAL HIGH (ref 70–99)
Potassium: 4.3 mmol/L (ref 3.5–5.1)
Sodium: 135 mmol/L (ref 135–145)

## 2018-05-20 LAB — URINALYSIS, ROUTINE W REFLEX MICROSCOPIC

## 2018-05-20 LAB — FERRITIN: FERRITIN: 35 ng/mL (ref 24–336)

## 2018-05-20 LAB — URINALYSIS, MICROSCOPIC (REFLEX): RBC / HPF: >50 RBC/hpf (ref 0–5)

## 2018-05-20 LAB — PROTIME-INR
INR: 2.41
Prothrombin Time: 26 seconds — ABNORMAL HIGH (ref 11.4–15.2)

## 2018-05-20 LAB — CBC
HCT: 25.9 % — ABNORMAL LOW (ref 39.0–52.0)
HEMOGLOBIN: 7.8 g/dL — AB (ref 13.0–17.0)
MCH: 24.7 pg — AB (ref 26.0–34.0)
MCHC: 30.1 g/dL (ref 30.0–36.0)
MCV: 82 fL (ref 78.0–100.0)
Platelets: 158 10*3/uL (ref 150–400)
RBC: 3.16 MIL/uL — AB (ref 4.22–5.81)
RDW: 23 % — ABNORMAL HIGH (ref 11.5–15.5)
WBC: 5.5 10*3/uL (ref 4.0–10.5)

## 2018-05-20 LAB — IRON AND TIBC
IRON: 22 ug/dL — AB (ref 45–182)
SATURATION RATIOS: 7 % — AB (ref 17.9–39.5)
TIBC: 300 ug/dL (ref 250–450)
UIBC: 278 ug/dL

## 2018-05-20 LAB — LACTATE DEHYDROGENASE: LDH: 220 U/L — ABNORMAL HIGH (ref 98–192)

## 2018-05-20 LAB — MAGNESIUM: Magnesium: 1.9 mg/dL (ref 1.7–2.4)

## 2018-05-20 MED ORDER — WARFARIN SODIUM 2 MG PO TABS
2.0000 mg | ORAL_TABLET | Freq: Once | ORAL | Status: AC
  Administered 2018-05-20: 2 mg via ORAL
  Filled 2018-05-20: qty 1

## 2018-05-20 MED ORDER — FUROSEMIDE 10 MG/ML IJ SOLN
40.0000 mg | Freq: Once | INTRAMUSCULAR | Status: AC
  Administered 2018-05-20: 40 mg via INTRAVENOUS
  Filled 2018-05-20: qty 4

## 2018-05-20 MED ORDER — SODIUM CHLORIDE 0.9 % IV SOLN
INTRAVENOUS | Status: DC
  Administered 2018-05-20: 23:00:00 via INTRAVENOUS

## 2018-05-20 MED ORDER — MAGNESIUM SULFATE 2 GM/50ML IV SOLN
2.0000 g | Freq: Once | INTRAVENOUS | Status: AC
  Administered 2018-05-20: 2 g via INTRAVENOUS
  Filled 2018-05-20: qty 50

## 2018-05-20 NOTE — Progress Notes (Addendum)
Patient ID: [name], male   DOB: 1941-04-27, 77 y.o.   MRN: [id number]   Advanced Heart Failure VAD Team Note  PCP-Cardiologist: No primary care provider on file.   Subjective:    7/12 had TURB + right ureteral stent.   Creatinine 2.15 > 2.04. Renal US with no hydronephrosis. Abd Xray with no acute findings. Received 40 mg IV lasix yesterday. Down 2 lbs.   CBC pending. INR 2.41.   Afebrile. Urine culture negative. Blood cultures negative. Remains on Vanc/Zosyn.  Pulled our ureteral stent yesterday.   Denies CP, SOB. Says urine is now brown. No dysuria. Denies fever/chills.   LVAD INTERROGATION:  HeartMate 2 LVAD:   Flow 4.8 liters/min, speed 8800, power 5, PI 5.5.  1 PI event/24 hours. Personally reviewed.   Objective:    Vital Signs:   Temp:  [97.5 F (36.4 C)-98.3 F (36.8 C)] 97.6 F (36.4 C) (07/16 0700) Pulse Rate:  [59] 59 (07/16 0700) Resp:  [12-21] 13 (07/16 0700) BP: (81-102)/(65-85) 97/85 (07/16 0700) SpO2:  [90 %-100 %] 92 % (07/16 0700) Weight:  [176 lb 9.4 oz (80.1 kg)] 176 lb 9.4 oz (80.1 kg) (07/16 0441) Last BM Date: 05/17/18 Mean arterial Pressure 70-80s  Intake/Output:   Intake/Output Summary (Last 24 hours) at 05/20/2018 0751 Last data filed at 05/20/2018 0441 Gross per 24 hour  Intake 943.25 ml  Output 1700 ml  Net -756.75 ml     Physical Exam    General:  NAD.  HEENT: Normal. Neck: Supple, JVP 7-8 cm. Carotids OK.  Cardiac:  Mechanical heart sounds with LVAD hum present.  Lungs:  CTAB, normal effort.  Abdomen:  NT, ND, no HSM. No bruits or masses. +BS  LVAD exit site: Dressing dry and intact. No erythema or drainage. Stabilization device present and accurately applied.  Extremities:  Warm and dry. No cyanosis, clubbing, rash, or edema.  Neuro:  Alert & oriented x 3. Cranial nerves grossly intact. Moves all 4 extremities w/o difficulty. Affect pleasant     Telemetry   Apaced 60s. Personally reviewed.   EKG    No new  tracings.   Labs   Basic Metabolic Panel: Recent Labs  Lab 05/16/18 0603 05/17/18 0253 05/18/18 0237 05/19/18 0254 05/20/18 0415  NA 138 138 138 134* 135  K 4.3 4.9 4.6 4.4 4.3  CL 104 106 108 104 103  CO2 25 26 24 23 25   GLUCOSE 105* 118* 102* 125* 104*  BUN 29* 24* 24* 26* 23  CREATININE 1.61* 1.84* 1.87* 2.15* 2.04*  CALCIUM 8.9 8.5* 8.5* 8.4* 8.4*  MG 2.1 2.1 1.9 2.2 1.9    Liver Function Tests: Recent Labs  Lab 05/14/18 1030  AST 24  ALT 14  ALKPHOS 140*  BILITOT 0.7  PROT 7.6  ALBUMIN 3.5   No results for input(s): LIPASE, AMYLASE in the last 168 hours. No results for input(s): AMMONIA in the last 168 hours.  CBC: Recent Labs  Lab 05/14/18 1030 05/15/18 2446 05/16/18 0603 05/17/18 0253 05/18/18 0237 05/19/18 0254  WBC 5.3 5.4 5.4 5.4 5.3 6.7  NEUTROABS 3.8  --   --   --   --   --   HGB 9.3* 8.9* 8.6* 8.1* 7.9* 8.0*  HCT 31.9* 30.1* 28.5* 27.3* 26.9* 25.9*  MCV 82.4 82.5 81.4 83.2 83.8 82.0  PLT 203 207 206 159 159 132*    INR: Recent Labs  Lab 05/16/18 0603 05/17/18 0253 05/18/18 0237 05/19/18 0254 05/20/18 0415  INR  1.71 1.67 2.02 2.46 2.41    Other results:  EKG:    Imaging   US Renal  Result Date: 05/19/2018 CLINICAL DATA:  Acute renal insufficiency EXAM: RENAL / URINARY TRACT ULTRASOUND COMPLETE COMPARISON:  CT abdomen and pelvis December 14, 2017 FINDINGS: Right Kidney: Length: 11.4 cm. Echogenicity and renal cortical thickness are within normal limits. No perinephric fluid or hydronephrosis visualized. There is a cyst arising from the mid right kidney measuring 6.8 x 6.0 x 7.4 cm. A second nearby cyst measures 2.5 x 2.0 x 2.2 cm. Left Kidney: Length: 11.5 cm. Echogenicity and renal cortical thickness are within normal limits. No perinephric fluid or hydronephrosis visualized. There is a cyst in the mid left kidney measuring 3.3 x 3.1 x 3.5 cm with a nearby cyst measuring 1.4 x 1.5 x 1.9 cm. There is calcification along the periphery  of this smaller cyst, also present on prior CT. There are several smaller cysts throughout the left kidney is well. A cyst arising from the lower pole measures 1.3 x 1.3 x 1.1 cm. There are several lower pole subcentimeter cysts. No sonographically demonstrable calculus or ureterectasis. Bladder: There is wall irregularity along the inferior aspect of the urinary bladder. IMPRESSION: 1. Wall irregularity along the inferior aspect of the urinary bladder. Suspect cystitis. A mass lesion within this thickening cannot be entirely excluded. This finding may warrant direct visualization of the urinary bladder. 2. Cystic lesions in both kidneys. A cyst in the mid left kidney shows calcification in its peripheral wall, similar to recent CT. 3. No hydronephrosis on either side. The renal cortical thickness and renal echogenicity are within normal limits bilaterally. Electronically Signed   By: Lowella Grip III M.D.   On: 05/19/2018 08:37   Dg Chest Port 1 View  Result Date: 05/18/2018 CLINICAL DATA:  Encounter for pneumonia. EXAM: PORTABLE CHEST 1 VIEW COMPARISON:  04/01/18 FINDINGS: There is a left chest wall ICD with leads in the right atrial appendage and right ventricle. A left ventricular assist device is noted. Stable cardiac enlargement and aortic atherosclerosis. Chronic interstitial coarsening is identified bilaterally. No superimposed airspace consolidation. IMPRESSION: 1. No airspace opacities identified to suggest pneumonia. 2. Cardiac enlargement with stable appearance of ICD and LVAD. Electronically Signed   By: Kerby Moors M.D.   On: 05/18/2018 12:20   Dg Abd Portable 1v  Result Date: 05/19/2018 CLINICAL DATA:  Ureteral stent placement. EXAM: PORTABLE ABDOMEN - 1 VIEW COMPARISON:  KUB 05/19/2018. FINDINGS: Previously seen right double-J ureteral stent is no longer visualized. Multiple wires overlie the right upper quadrant of the abdomen. Atherosclerotic calcifications are noted. IMPRESSION:  Previously seen right ureteral stent is no longer visualized. No acute finding. Electronically Signed   By: Inge Rise M.D.   On: 05/19/2018 15:49   Dg Abd Portable 1v  Result Date: 05/19/2018 CLINICAL DATA:  Right ureteral stent placement.  Hematuria. EXAM: PORTABLE ABDOMEN - 1 VIEW COMPARISON:  CT abdomen and pelvis December 14, 2017 FINDINGS: The double-J stent has its proximal aspect at the level of L3-4 on the right, presumably in the proximal ureter. The distal aspect of the stent is at the level of the proximal urethra. There is no bowel dilatation or air-fluid level to suggest bowel obstruction. No free air. Left ventricular assist device present. There are postoperative changes in the lower lumbar spine. There is extensive arterial vascular calcification in the iliac and femoral artery regions. IMPRESSION: Double-J stent on the right has its proximal aspect inferior to  the right renal pelvis, located at L3-4 in the right ureteral region. The distal aspect of the stent is felt to be in the proximal urethra region based on frontal view only assessment. Bowel gas pattern unremarkable. Extensive arterial vascular calcification noted. These results will be called to the ordering clinician or representative by the Radiologist Assistant, and communication documented in the PACS or zVision Dashboard. Electronically Signed   By: Lowella Grip III M.D.   On: 05/19/2018 09:15     Medications:     Scheduled Medications: . amiodarone  200 mg Oral Daily  . atorvastatin  80 mg Oral q1800  . levothyroxine  150 mcg Oral QAC breakfast  . multivitamin with minerals  1 tablet Oral Daily  . pantoprazole  40 mg Oral BID  . ranolazine  500 mg Oral BID  . sildenafil  20 mg Oral TID  . Warfarin - Pharmacist Dosing Inpatient   Does not apply q1800    Infusions: . lactated ringers 10 mL/hr at 05/16/18 1152  . piperacillin-tazobactam (ZOSYN)  IV Stopped (05/20/18 0303)  . vancomycin Stopped (05/19/18  1945)    PRN Medications: acetaminophen, docusate sodium, ondansetron (ZOFRAN) IV, promethazine, temazepam   Patient Profile   [name] is a 77 y.o. male with h/o VT, PAD and severe systolic HF (EF 60-10%) due to mixed ischemic/nonischemic CM he is s/p HM II LVAD implant for destination therapy on October 19, 2013. VAD implantation was complicated by VT, syncope, AF/Aflutter and RHF. Required short term milrinone.  Admitted for optimization and heparin bridge prior to bladder surgery on Friday.   Assessment/Plan   1. Chronic systolic HF: s/p HM-II LVAD 10/2014. [place] in 1/19 with mildly low output. Started on milrinone without benefit so it was stopped. Takes PRN lasix at home.  VAD interrogated personally. Parameters stable.No change. s/p previous driveline repair 07/3234. No further pump stops - Volume status okay. Creatinine slightly improved today. Received 40 mg IV lasix yesterday. Down 2 lbs.  2. VT: Underwent DC-CV 9/17, 08/17/17, and 04/01/18 -Continue amiodarone 200 mg daily  - Keep K > 4.0 Mg > 2.0. No change.  3. Recurrent renal stone and bladder tumor: S/p stone removal. 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 Had 2 BCG infusions with Dr. Gloriann Loan but discontinued due to inability to tolerate.  Now with recurrent tumor and s/p TURB with right ureteral stent placement on 7/12.  Hematuria still present, likely result of procedure.  - Ureteral stent removed yesterday by patient. Abdominal Xray with no acute findings.  - Renal US negative for hydronephrosis.  4. CKD, stage III: Had obstructive uropathy and ATN. Required HD x1 (11/2017).  Creatinine trending down 2.15 > 2.04.  - Urine is brown this morning. Check UA.  5. 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  Admitted 4/18 with recurrent GIB. Transfused 2u. EGD/colon negative.  Admitted 12/18 with hgb 6.5, Transfused 5u. EGD with 2 AVMs treated with APC.  - Had been getting octreotide  infusions but too expensive for him. Willcontinue to hold for now - Hemoglobin 9.3 > 8.9 > 8.6 > 8.1>7.9>8.0.  No overt GI bleeding, may be related to hematuria/GU procedure. CBC pending this am.  - Continue warfarin 6. Atrial arrhythmias: Atrial fibrillation, atrial tachycardia. S/p DC-CV 06/04/14, DC-CV 03/02/15 and 01/19/16.  - Remains in NSR/a-paced. No change.  - Continue amiodarone 200 mg daily 7. HTN: MAP 70-80s. No change.  8. LVAD: VAD interrogated personally. Parameters stable. LDH  stable.  9. Anticoagulation management:  - Goal INR 2-2.5 - Continue warfarin. INR 2.41 10. Lumbar compression fracture:s/p L3-L5 fusion 02/03/16. - Resolved. No change.  11. 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 Continue atorvastatin. (and warfarin INR goal 2-2.5). S/p R CEA 01/13/16. Plavix stopped 12/18 due to recurrent GIB. No change.  12. H/o amio-induced hyperthyroidism: Followed by Dr. Forde Dandy in past. Amio restarted due to recurrent atrial arrhythmias and VT. Recent FT4 elevated. Synthroid cut to 150 daily in 12/18. TSH elevated 10.125, but T4 normal 1.05.  No change.  13. ID: Fever 7/13, WBCs not elevated. Suspect related to urinary tract instrumentation.  - Urine culture negative. Blood cultures negative. Remains afebrile.  - CXR negative.  - Continue empiric vanc/Zosyn for now. WBC pending.  I reviewed the LVAD parameters from today, and compared the results to the patient's prior recorded data.  No programming changes were made.  The LVAD is functioning within specified parameters.  The patient performs LVAD self-test daily.  LVAD interrogation was negative for any significant power changes, alarms or PI events/speed drops.  LVAD equipment check completed and is in good working order.  Back-up equipment present.   LVAD education done on emergency procedures and precautions and reviewed exit site care.  Length of Stay: [place], NP 05/20/2018, 7:51 AM  VAD Team --- VAD  ISSUES ONLY--- Pager [phone] (7am - 7am)  Advanced Heart Failure Team  Pager [phone] (M-F; 7a - 4p)  Please contact [facility] for night-coverage after hours (4p -7a ) and weekends on [email or web address]  Patient seen with NP, agree with the above note.  INR therapeutic today but hgb down a bit to 7.8.  Brown urine today, likely old blood from procedure.  Good diuresis yesterday with IV Lasix and weight is down.  JVP still elevated today so will give 1 more dose of Lasix 40 mg IV. LVAD parameters stable.  No transfusion today, repeat CBC tomorrow. Will check Fe indices.   Ureteral stent is now out.  Per urology, will need another cystoscopy in several months.   Afebrile, cultures negative.  Continue vanc/Zosyn today empirically.  If remains afebrile with normal WBCs, will stop abx at 5 days.   Possibly home tomorrow if creatinine trending down and hgb stabilizes.   Loralie Champagne 05/20/2018 9:05 AM

## 2018-05-20 NOTE — Progress Notes (Signed)
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  Ongoing, being addressed with oral nutrition supplements  GOAL:   Patient will meet greater than or equal to 90% of their needs  Progressing  MONITOR:   PO intake, Supplement acceptance, Labs, Weight trends, Skin, I & O's  REASON FOR ASSESSMENT:   Malnutrition Screening Tool    ASSESSMENT:   [name] is a 77 y.o. 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 VAD implantation was complicated by VT, syncope, AF/Aflutter and RHF. Required short term milrinone. Diagnosed with bladder tumor 11/2017  7/12 - s/p TURB + right ureteral stent 7/15 - ureteral stent removed by pt  Spoke with pt and with RN. Pt reports that his appetite remains poor but that he is doing "well" at meals. Pt states he consumed 75% of his lunch of chicken and dumplings.  Pt states that he does not like the Ensure Enlive supplements previously ordered by RD. Noted order d/c. Pt states that he is bringing Boost supplements from home. RD made sure that pt had Boost supplements in room. RN to bring cup of ice for pt when he would like to drink one.  Pt states that he is trying to maintain his weight between 178-182 lbs currently. Noted pt with 3 lb weight gain since admission. Suspect weight fluctuations  related to fluid status.  Pt is looking forward to discharge which he hopes will be tomorrow.  Meal Completion: 10-100%  Medications reviewed and include: 150 mcg levothyroxine daily, MVI with minerals daily, 40 mg Protonix BID, warfarin, IV antibiotics  Labs reviewed: creatinine 2.04 (H), hemoglobin 7.8 (L), HCT 25.9 (L)  UOP: 1700 ml x 24 hours  Diet Order:   Diet Order           Diet 2 gram sodium Room service appropriate? Yes; Fluid consistency: Thin  Diet effective now          EDUCATION NEEDS:   No education needs have been identified at this time  Skin:  Skin Assessment: Skin Integrity Issues: Skin Integrity Issues:: Incisions Incisions: incision to perineum, penis  Last BM:  05/19/18  Height:   Ht Readings from Last 1 Encounters:  05/16/18 6' 0.01" (1.829 m)    Weight:   Wt Readings from Last 1 Encounters:  05/20/18 176 lb 9.4 oz (80.1 kg)    Ideal Body Weight:  80.9 kg  BMI:  Body mass index is 23.94 kg/m.  Estimated Nutritional Needs:   Kcal:  2200-2400 kcal/day  Protein:  115-130 grams/day  Fluid:  2.2-2.4 L/day    Frank Face, MS, RD, LDN Pager: [phone] Weekend/After Hours: [phone]

## 2018-05-20 NOTE — Progress Notes (Signed)
LVAD Coordinator Rounding Note:  Admitted 05/14/18 due for heparin bridge in prep for bladder tumor resection on 05/16/18.[name]   HM II LVAD implanted on 10/19/13 by Dr. Cyndia Bent with oversewn aortic valveunder DT criteria.   Vital signs: Temp: 97.6 HR:60 Doppler Pressure:90 Automatic BP: 97/85 O2 Sat: 92% on RA   Weight: 173>178>176lbs   LVAD interrogation reveals:  Speed: 8800 Flow:5.2 Power: 5.0w PI: 4.8 Alarms: none Events:none  Fixed speed: 8800 Low speed limit: 8200  Drive Line: Sorbaview dressing dry and intact; anchor intact and accurately applied. Weekly dressing changes.Next change is due 05/25/18. May be changed by [facility] or bedside nurse.  Labs:  LDH trend: 237>226>240>220  INR trend: 1.84>1.71>2.46>2.41  Creat trend: 1.54>1.61>2.15>2.04  Anticoagulation Plan: - INR Goal: 2.0 - 2.5 - ASA Dose: no ASA (removed 02/2017 due to GI bleed)  Device: -Dual Medtronic ICD -Therapies: on at 200 bpm - Last device check 10/02/17  Significant Events on VAD Support:  09/01/14>>ureteral stone with cystoscopy and flexible ureteroscopy with lithotripsy and basket extraction 02/2015 >>GIB with 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 Started on Plavix 3/17>>Lumbar fusion L3-L5 9/17>>VT with DC-CV; amio increased 10/17>>ramp with RV failure; speed decreased to 8800 02/2017> GIB (removed ASA, scopes negative) 03/2017>percutaneous lead repair after pump stops  10/18>>Slow VT. Cardioverted in ED and amiodarone uptitrated 12/18>>melena with scope. Hgb 6.5. EGD with 2 AVMs with APC 12/18>>recurrent hematuria. 6 mm left ureteral stone 1/19>>7 mm left ureteral stone removal with laser lithotripsy; left ureteral stent. Resection of 6 cm bladder tumor   Plan/Recommendations:   1. Please call VAD  pager if any issues with VAD equipment or questions, concerns. 2. VAD coordinator will accompany pt to the OR today.  Tanda Rockers RN, VAD Coordinator 24/7 pager [phone]

## 2018-05-20 NOTE — Progress Notes (Signed)
Pharmacy Antibiotic Note  [name] is a 77 y.o. male admitted on 05/14/2018 with for urologic procedure.  Pharmacy has been consulted for vancomycin and Zosyn dosing with fevers after the procedure. Renal function has remained stable, all cultures negative thus far, WBC wnl and pt is afebrile. Planning to stop ABX tomorrow if pt remains afebrile overnight, so will defer vancomycin level at this time.  Plan: -Continue Zosyn 3.375g IV q8h EI -Continue vancomycin 1000mg  IV q24h -Monitor LOT, cultures, renal function  Height: 6' 0.01" (182.9 cm) Weight: 176 lb 9.4 oz (80.1 kg) IBW/kg (Calculated) : 77.62  Temp (24hrs), Avg:97.9 F (36.6 C), Min:97.5 F (36.4 C), Max:98.3 F (36.8 C)  Recent Labs  Lab 05/16/18 0603 05/17/18 0253 05/18/18 0237 05/19/18 0254 05/20/18 0415 05/20/18 0800  WBC 5.4 5.4 5.3 6.7  --  5.5  CREATININE 1.61* 1.84* 1.87* 2.15* 2.04*  --     Estimated Creatinine Clearance: 32.8 mL/min (A) (by C-G formula based on SCr of 2.04 mg/dL (H)).    Allergies  Allergen Reactions  . Demerol [Meperidine] Other (See Comments)    Paralysis/ Could only move eyes    Antimicrobials this admission: Vancomycin 7/13 >  Zosyn 7/13 >   Dose adjustments this admission: none  Microbiology results: 7/13 BCx: NGTD 7/13 UCx: negative 7/10 MRSA PCR: positive  Thank you for allowing pharmacy to be a part of this patient's care.  Arrie Senate, PharmD, BCPS Clinical Pharmacist [phone] Please check AMION for all [facility] [place] numbers 05/20/2018

## 2018-05-20 NOTE — Progress Notes (Signed)
[facility] for Coumadin Indication: LVAD  Allergies  Allergen Reactions  . Demerol [Meperidine] Other (See Comments)    Paralysis/ Could only move eyes    Patient Measurements: Ht: 6' Wt: 78.6 kg Heparin Dosing Weight: 78.6 kg  Vital Signs: Temp: 98.3 F (36.8 C) (07/16 0315) Temp Source: Oral (07/16 0315) BP: 83/68 (07/16 0315)  Labs: Recent Labs    05/17/18 0718 05/18/18 0237 05/19/18 0254 05/20/18 0415  HGB  --  7.9* 8.0*  --   HCT  --  26.9* 25.9*  --   PLT  --  159 132*  --   LABPROT  --  22.7* 26.5* 26.0*  INR  --  2.02 2.46 2.41  HEPARINUNFRC 0.29* 0.42  --   --   CREATININE  --  1.87* 2.15* 2.04*    Estimated Creatinine Clearance: 32.8 mL/min (A) (by C-G formula based on SCr of 2.04 mg/dL (H)).   . lactated ringers 10 mL/hr at 05/16/18 1152  . piperacillin-tazobactam (ZOSYN)  IV Stopped (05/20/18 0303)  . vancomycin Stopped (05/19/18 1945)     Assessment: 66 yoM presenting for planned bladder tumor resection with hx of LVAD HM II on warfarin PTA. Warfarin now resumed post/op and INR therapeutic at 2.41, LDH stable, Hgb down slightly. Urine brown per pt, UA ordered.   **PTA Warfarin Dose = 2mg  daily  Goal of Therapy:  INR 2-2.5 Monitor platelets by anticoagulation protocol: Yes   Plan:  -Warfarin 2mg  PO x1 tonight -Daily INR  Arrie Senate, PharmD, BCPS Clinical Pharmacist [phone] Please check AMION for all [facility] Pharm[facility] numbers 05/20/2018

## 2018-05-21 LAB — CBC
HCT: 25.8 % — ABNORMAL LOW (ref 39.0–52.0)
Hemoglobin: 7.7 g/dL — ABNORMAL LOW (ref 13.0–17.0)
MCH: 24.5 pg — ABNORMAL LOW (ref 26.0–34.0)
MCHC: 29.8 g/dL — ABNORMAL LOW (ref 30.0–36.0)
MCV: 82.2 fL (ref 78.0–100.0)
Platelets: 160 10*3/uL (ref 150–400)
RBC: 3.14 MIL/uL — AB (ref 4.22–5.81)
RDW: 23.2 % — ABNORMAL HIGH (ref 11.5–15.5)
WBC: 4.8 10*3/uL (ref 4.0–10.5)

## 2018-05-21 LAB — PROTIME-INR
INR: 2.21
PROTHROMBIN TIME: 24.3 s — AB (ref 11.4–15.2)

## 2018-05-21 LAB — BASIC METABOLIC PANEL
Anion gap: 8 (ref 5–15)
BUN: 21 mg/dL (ref 8–23)
CALCIUM: 8.3 mg/dL — AB (ref 8.9–10.3)
CO2: 26 mmol/L (ref 22–32)
CREATININE: 1.96 mg/dL — AB (ref 0.61–1.24)
Chloride: 100 mmol/L (ref 98–111)
GFR calc Af Amer: 36 mL/min — ABNORMAL LOW (ref >60)
GFR, EST NON AFRICAN AMERICAN: 31 mL/min — AB (ref >60)
Glucose, Bld: 104 mg/dL — ABNORMAL HIGH (ref 70–99)
Potassium: 4.2 mmol/L (ref 3.5–5.1)
SODIUM: 134 mmol/L — AB (ref 135–145)

## 2018-05-21 LAB — PREPARE RBC (CROSSMATCH)

## 2018-05-21 LAB — MAGNESIUM: MAGNESIUM: 2.1 mg/dL (ref 1.7–2.4)

## 2018-05-21 LAB — LACTATE DEHYDROGENASE: LDH: 222 U/L — ABNORMAL HIGH (ref 98–192)

## 2018-05-21 MED ORDER — SODIUM CHLORIDE 0.9% IV SOLUTION: Freq: Once | INTRAVENOUS | Status: DC

## 2018-05-21 MED ORDER — WARFARIN SODIUM 2 MG PO TABS: 2.0000 mg | ORAL_TABLET | Freq: Once | ORAL | Status: DC

## 2018-05-21 MED ORDER — SODIUM CHLORIDE 0.9 % IV SOLN
510.0000 mg | Freq: Once | INTRAVENOUS | Status: AC
  Administered 2018-05-21: 510 mg via INTRAVENOUS
  Filled 2018-05-21: qty 17

## 2018-05-21 NOTE — Progress Notes (Signed)
Pt discharged to home, discharge instructions reviewed with pt and pts caregiver, no questions or concerns at this time.

## 2018-05-21 NOTE — Progress Notes (Addendum)
Patient ID: [name], male   DOB: 1941/05/28, 77 y.o.   MRN: [id number]   Advanced Heart Failure VAD Team Note  PCP-Cardiologist: No primary care provider on file.   Subjective:    7/12 had TURB + right ureteral stent.   Creatinine 2.15 > 2.04 > 1.96. Received 40 mg IV lasix yesterday with -1 L UOP. Weight down 4 lbs, back to baseline weight.  Hemoglobin 7.7. Iron studies low. INR 2.21.   Remains afebrile. Urine culture negative. Blood cultures NGTD. Remains on Vanc/Zosyn.  Ureteral stent now out. He will need follow up cystoscopy in 3 months per urology.   Denies CP, SOB. Urine is still brown. Wants to go home.   LVAD INTERROGATION:  HeartMate 2 LVAD:   Flow 4.3 liters/min, speed 8800, power 5, PI 5.5. 1 PI event/24 hours. Personally reviewed.   Objective:    Vital Signs:   Temp:  [97.5 F (36.4 C)-98.4 F (36.9 C)] 98.3 F (36.8 C) (07/17 0401) Pulse Rate:  [62-71] 62 (07/17 0401) Resp:  [13-16] 15 (07/17 0401) BP: (90-92)/(75-78) 90/78 (07/17 0401) SpO2:  [91 %-98 %] 93 % (07/17 0401) Weight:  [172 lb 4.8 oz (78.2 kg)] 172 lb 4.8 oz (78.2 kg) (07/17 0401) Last BM Date: 05/20/18 Mean arterial Pressure 70-80s  Intake/Output:   Intake/Output Summary (Last 24 hours) at 05/21/2018 0747 Last data filed at 05/21/2018 0405 Gross per 24 hour  Intake 913.73 ml  Output 1950 ml  Net -1036.27 ml     Physical Exam    General: NAD.  HEENT: Normal. Neck: Supple, JVP 8-9 cm. Carotids OK.  Cardiac:  Mechanical heart sounds with LVAD hum present.  Lungs:  CTAB, normal effort.  Abdomen:  NT, ND, no HSM. No bruits or masses. +BS  LVAD exit site:  Dressing dry and intact. No erythema or drainage. Stabilization device present and accurately applied.  Extremities:  Warm and dry. No cyanosis, clubbing, rash, or edema.  Neuro:  Alert & oriented x 3. Cranial nerves grossly intact. Moves all 4 extremities w/o difficulty. Affect pleasant     Telemetry   Apaced 60s.  Personally reviewed.  EKG    No new tracings.   Labs   Basic Metabolic Panel: Recent Labs  Lab 05/17/18 0253 05/18/18 0237 05/19/18 0254 05/20/18 0415 05/21/18 0317  NA 138 138 134* 135 134*  K 4.9 4.6 4.4 4.3 4.2  CL 106 108 104 103 100  CO2 26 24 23 25 26   GLUCOSE 118* 102* 125* 104* 104*  BUN 24* 24* 26* 23 21  CREATININE 1.84* 1.87* 2.15* 2.04* 1.96*  CALCIUM 8.5* 8.5* 8.4* 8.4* 8.3*  MG 2.1 1.9 2.2 1.9 2.1    Liver Function Tests: Recent Labs  Lab 05/14/18 1030  AST 24  ALT 14  ALKPHOS 140*  BILITOT 0.7  PROT 7.6  ALBUMIN 3.5   No results for input(s): LIPASE, AMYLASE in the last 168 hours. No results for input(s): AMMONIA in the last 168 hours.  CBC: Recent Labs  Lab 05/14/18 1030  05/17/18 0253 05/18/18 0237 05/19/18 0254 05/20/18 0800 05/21/18 0317  WBC 5.3   < > 5.4 5.3 6.7 5.5 4.8  NEUTROABS 3.8  --   --   --   --   --   --   HGB 9.3*   < > 8.1* 7.9* 8.0* 7.8* 7.7*  HCT 31.9*   < > 27.3* 26.9* 25.9* 25.9* 25.8*  MCV 82.4   < > 83.2 83.8 82.0  82.0 82.2  PLT 203   < > 159 159 132* 158 160   < > = values in this interval not displayed.    INR: Recent Labs  Lab 05/17/18 0253 05/18/18 0237 05/19/18 0254 05/20/18 0415 05/21/18 0317  INR 1.67 2.02 2.46 2.41 2.21    Other results:  EKG:    Imaging   US Renal  Result Date: 05/19/2018 CLINICAL DATA:  Acute renal insufficiency EXAM: RENAL / URINARY TRACT ULTRASOUND COMPLETE COMPARISON:  CT abdomen and pelvis December 14, 2017 FINDINGS: Right Kidney: Length: 11.4 cm. Echogenicity and renal cortical thickness are within normal limits. No perinephric fluid or hydronephrosis visualized. There is a cyst arising from the mid right kidney measuring 6.8 x 6.0 x 7.4 cm. A second nearby cyst measures 2.5 x 2.0 x 2.2 cm. Left Kidney: Length: 11.5 cm. Echogenicity and renal cortical thickness are within normal limits. No perinephric fluid or hydronephrosis visualized. There is a cyst in the mid left  kidney measuring 3.3 x 3.1 x 3.5 cm with a nearby cyst measuring 1.4 x 1.5 x 1.9 cm. There is calcification along the periphery of this smaller cyst, also present on prior CT. There are several smaller cysts throughout the left kidney is well. A cyst arising from the lower pole measures 1.3 x 1.3 x 1.1 cm. There are several lower pole subcentimeter cysts. No sonographically demonstrable calculus or ureterectasis. Bladder: There is wall irregularity along the inferior aspect of the urinary bladder. IMPRESSION: 1. Wall irregularity along the inferior aspect of the urinary bladder. Suspect cystitis. A mass lesion within this thickening cannot be entirely excluded. This finding may warrant direct visualization of the urinary bladder. 2. Cystic lesions in both kidneys. A cyst in the mid left kidney shows calcification in its peripheral wall, similar to recent CT. 3. No hydronephrosis on either side. The renal cortical thickness and renal echogenicity are within normal limits bilaterally. Electronically Signed   By: Lowella Grip III M.D.   On: 05/19/2018 08:37   Dg Abd Portable 1v  Result Date: 05/19/2018 CLINICAL DATA:  Ureteral stent placement. EXAM: PORTABLE ABDOMEN - 1 VIEW COMPARISON:  KUB 05/19/2018. FINDINGS: Previously seen right double-J ureteral stent is no longer visualized. Multiple wires overlie the right upper quadrant of the abdomen. Atherosclerotic calcifications are noted. IMPRESSION: Previously seen right ureteral stent is no longer visualized. No acute finding. Electronically Signed   By: Inge Rise M.D.   On: 05/19/2018 15:49   Dg Abd Portable 1v  Result Date: 05/19/2018 CLINICAL DATA:  Right ureteral stent placement.  Hematuria. EXAM: PORTABLE ABDOMEN - 1 VIEW COMPARISON:  CT abdomen and pelvis December 14, 2017 FINDINGS: The double-J stent has its proximal aspect at the level of L3-4 on the right, presumably in the proximal ureter. The distal aspect of the stent is at the level of  the proximal urethra. There is no bowel dilatation or air-fluid level to suggest bowel obstruction. No free air. Left ventricular assist device present. There are postoperative changes in the lower lumbar spine. There is extensive arterial vascular calcification in the iliac and femoral artery regions. IMPRESSION: Double-J stent on the right has its proximal aspect inferior to the right renal pelvis, located at L3-4 in the right ureteral region. The distal aspect of the stent is felt to be in the proximal urethra region based on frontal view only assessment. Bowel gas pattern unremarkable. Extensive arterial vascular calcification noted. These results will be called to the ordering clinician or representative by  the Radiologist Assistant, and communication documented in the PACS or zVision Dashboard. Electronically Signed   By: Lowella Grip III M.D.   On: 05/19/2018 09:15     Medications:     Scheduled Medications: . amiodarone  200 mg Oral Daily  . atorvastatin  80 mg Oral q1800  . levothyroxine  150 mcg Oral QAC breakfast  . multivitamin with minerals  1 tablet Oral Daily  . pantoprazole  40 mg Oral BID  . ranolazine  500 mg Oral BID  . sildenafil  20 mg Oral TID  . warfarin  2 mg Oral ONCE-1800  . Warfarin - Pharmacist Dosing Inpatient   Does not apply q1800    Infusions: . sodium chloride Stopped (05/20/18 2303)  . lactated ringers 10 mL/hr at 05/16/18 1152  . piperacillin-tazobactam (ZOSYN)  IV Stopped (05/21/18 0305)  . vancomycin 1,000 mg (05/20/18 1643)    PRN Medications: acetaminophen, docusate sodium, ondansetron (ZOFRAN) IV, promethazine, temazepam   Patient Profile   [name] is a 77 y.o. male with h/o VT, PAD and severe systolic HF (EF 19-50%) due to mixed ischemic/nonischemic CM he is s/p HM II LVAD implant for destination therapy on October 19, 2013. VAD implantation was complicated by VT, syncope, AF/Aflutter and RHF. Required short term  milrinone.  Admitted for optimization and heparin bridge prior to bladder surgery on Friday.   Assessment/Plan   1. Chronic systolic HF: s/p HM-II LVAD 10/2014. [place] in 1/19 with mildly low output. Started on milrinone without benefit so it was stopped. Takes PRN lasix at home.  VAD interrogated personally. Parameters stable.No change. s/p previous driveline repair 07/3266. No further pump stops - Volume status improved. Creatinine trending down. Received  40 mg IV lasix yesterday. Weight down 4 lbs, back to baseline weight.  2. VT: Underwent DC-CV 9/17, 08/17/17, and 04/01/18 -Continue amiodarone 200 mg daily  - Keep K > 4.0 Mg > 2.0. No change.  3. Recurrent renal stone and bladder tumor: S/p stone removal. 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 Had 2 BCG infusions with Dr. Gloriann Loan but discontinued due to inability to tolerate.  Now with recurrent tumor and s/p TURB with right ureteral stent placement on 7/12.  Hematuria still present, likely result of procedure.  - Ureteral stent removed 7/15 by patient. Abdominal Xray with no acute findings.  - Renal US 7/15 negative for hydronephrosis.  4. CKD, stage III: Had obstructive uropathy and ATN. Required HD x1 (11/2017).  Creatinine trending down 2.15 > 2.04 > 1.96 5. 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  Admitted 4/18 with recurrent GIB. Transfused 2u. EGD/colon negative.  Admitted 12/18 with hgb 6.5, Transfused 5u. EGD with 2 AVMs treated with APC.  - Had been getting octreotide infusions but too expensive for him. Willcontinue to hold for now - Hemoglobin 9.3 > 8.9 > 8.6 > 8.1>7.9>8.0>7.8>7.7.  No overt GI bleeding, may be related to hematuria/GU procedure. Iron studies low. Will order feraheme today. Will also order 1 unit PRBC. Discussed with Dr Aundra Dubin.  - Continue warfarin 6. Atrial arrhythmias: Atrial fibrillation, atrial tachycardia. S/p DC-CV 06/04/14, DC-CV 03/02/15 and 01/19/16.  - Remains in  NSR/a-paced. No change.   - Continue amiodarone 200 mg daily 7. HTN: MAP 70-80s. No change.  8. LVAD: VAD interrogated personally. Parameters stable. LDH stable 222.  9. Anticoagulation management:  - Goal INR 2-2.5 - Continue warfarin. INR 2.21 10. Lumbar compression fracture:s/p L3-L5 fusion 02/03/16. - Resolved. No  change.  11. 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 Continue atorvastatin. (and warfarin INR goal 2-2.5). S/p R CEA 01/13/16. Plavix stopped 12/18 due to recurrent GIB. No change.  12. H/o amio-induced hyperthyroidism: Followed by Dr. Forde Dandy in past. Amio restarted due to recurrent atrial arrhythmias and VT. Recent FT4 elevated. Synthroid cut to 150 daily in 12/18. TSH elevated 10.125, but T4 normal 1.05.  No change.  13. ID: Fever 7/13, WBCs not elevated. Suspect related to urinary tract instrumentation.  - Urine culture negative. Blood cultures NGTD. - CXR negative.  - Stop abx today. Remains afebrile, WBC normal. Received 5 days of abx.   I reviewed the LVAD parameters from today, and compared the results to the patient's prior recorded data.  No programming changes were made.  The LVAD is functioning within specified parameters.  The patient performs LVAD self-test daily.  LVAD interrogation was negative for any significant power changes, alarms or PI events/speed drops.  LVAD equipment check completed and is in good working order.  Back-up equipment present.   LVAD education done on emergency procedures and precautions and reviewed exit site care.  Plans for discharge after transfusion. Discussed with Dr Aundra Dubin.   Length of Stay: [place], NP 05/21/2018, 7:47 AM  VAD Team --- VAD ISSUES ONLY--- Pager [phone] (7am - 7am)  Advanced Heart Failure Team  Pager [phone] (M-F; 7a - 4p)  Please contact [facility] for night-coverage after hours (4p -7a ) and weekends on [email or web address]  Patient seen with NP, agree with the above note. INR therapeutic today  but hgb slightly to 7.7. Brown urine today but slowly clearing, likely old blood from procedure. Good diuresis yesterday with IV Lasix and weight is down to baseline.  LVAD parameters stable. - Fe deficient, will give IV Fe today.  - I am going to give him 1 unit PRBCs today, do not think that he is having GI bleeding.   Ureteral stent is now out.  Per urology, will need another cystoscopy in several months.  Afebrile, cultures negative. He is afebrile with normal WBCs, will stop abx today (has had 5 days).    Creatinine trending back down.   I think that he can go home today after transfusion.  Will continue same meds as prior to admission.  He will need close followup in LVAD clinic.   Loralie Champagne 05/21/2018 8:23 AM

## 2018-05-21 NOTE — Discharge Instructions (Signed)

## 2018-05-21 NOTE — Progress Notes (Addendum)
[place] for Coumadin Indication: LVAD  Allergies  Allergen Reactions  . Demerol [Meperidine] Other (See Comments)    Paralysis/ Could only move eyes    Patient Measurements: Ht: 6' Wt: 78.6 kg Heparin Dosing Weight: 78.6 kg  Vital Signs: Temp: 98.3 F (36.8 C) (07/17 0401) Temp Source: Oral (07/17 0401) BP: 90/78 (07/17 0401) Pulse Rate: 62 (07/17 0401)  Labs: Recent Labs    05/19/18 0254 05/20/18 0415 05/20/18 0800 05/21/18 0317  HGB 8.0*  --  7.8* 7.7*  HCT 25.9*  --  25.9* 25.8*  PLT 132*  --  158 160  LABPROT 26.5* 26.0*  --  24.3*  INR 2.46 2.41  --  2.21  CREATININE 2.15* 2.04*  --  1.96*    Estimated Creatinine Clearance: 34.1 mL/min (A) (by C-G formula based on SCr of 1.96 mg/dL (H)).   . sodium chloride Stopped (05/20/18 2303)  . lactated ringers 10 mL/hr at 05/16/18 1152  . piperacillin-tazobactam (ZOSYN)  IV Stopped (05/21/18 0305)  . vancomycin 1,000 mg (05/20/18 1643)     Assessment: 25 yoM presenting for planned bladder tumor resection with hx of LVAD HM II on warfarin PTA. Warfarin now resumed post/op and INR therapeutic at 2.21, LDH stable, Hgb stable from yesterday. INR appears to have stabilized on 2mg  daily - at discharge would give 2mg  daily with INR follow-up next week in VAD clinic.  **PTA Warfarin Dose = 2mg  daily  Goal of Therapy:  INR 2-2.5 Monitor platelets by anticoagulation protocol: Yes   Plan:  -Warfarin 2mg  PO x1 tonight -Daily INR  Arrie Senate, PharmD, BCPS Clinical Pharmacist [phone] Please check AMION for all [facility] numbers 05/21/2018

## 2018-05-22 LAB — BPAM RBC
BLOOD PRODUCT EXPIRATION DATE: 201908102359
ISSUE DATE / TIME: 201907171156
Unit Type and Rh: 5100

## 2018-05-22 LAB — TYPE AND SCREEN
ABO/RH(D): O POS
Antibody Screen: NEGATIVE
Unit division: 0

## 2018-05-22 LAB — CULTURE, BLOOD (ROUTINE X 2)
CULTURE: NO GROWTH
Culture: NO GROWTH
Special Requests: ADEQUATE

## 2018-05-23 ENCOUNTER — Other Ambulatory Visit (HOSPITAL_COMMUNITY): Payer: Self-pay | Admitting: Unknown Physician Specialty

## 2018-05-23 DIAGNOSIS — Z7901 Long term (current) use of anticoagulants: Secondary | ICD-10-CM

## 2018-05-23 DIAGNOSIS — Z95811 Presence of heart assist device: Secondary | ICD-10-CM

## 2018-05-27 ENCOUNTER — Ambulatory Visit (HOSPITAL_COMMUNITY): Payer: Self-pay | Admitting: Pharmacist

## 2018-05-27 ENCOUNTER — Ambulatory Visit (HOSPITAL_COMMUNITY)
Admission: RE | Admit: 2018-05-27 | Discharge: 2018-05-27 | Disposition: A | Discharge status: Home / Self Care | Payer: Medicare Other | Source: Ambulatory Visit | Attending: Cardiology | Admitting: Cardiology

## 2018-05-27 DIAGNOSIS — Z95811 Presence of heart assist device: Secondary | ICD-10-CM | POA: Diagnosis present

## 2018-05-27 DIAGNOSIS — Z7901 Long term (current) use of anticoagulants: Secondary | ICD-10-CM

## 2018-05-27 LAB — BASIC METABOLIC PANEL
Anion gap: 8 (ref 5–15)
BUN: 41 mg/dL — ABNORMAL HIGH (ref 8–23)
CO2: 24 mmol/L (ref 22–32)
CREATININE: 1.96 mg/dL — AB (ref 0.61–1.24)
Calcium: 9.1 mg/dL (ref 8.9–10.3)
Chloride: 103 mmol/L (ref 98–111)
GFR calc non Af Amer: 31 mL/min — ABNORMAL LOW (ref >60)
GFR, EST AFRICAN AMERICAN: 36 mL/min — AB (ref >60)
Glucose, Bld: 91 mg/dL (ref 70–99)
POTASSIUM: 4.4 mmol/L (ref 3.5–5.1)
Sodium: 135 mmol/L (ref 135–145)

## 2018-05-27 LAB — CBC
HCT: 31.5 % — ABNORMAL LOW (ref 39.0–52.0)
Hemoglobin: 9.3 g/dL — ABNORMAL LOW (ref 13.0–17.0)
MCH: 25.8 pg — ABNORMAL LOW (ref 26.0–34.0)
MCHC: 29.5 g/dL — ABNORMAL LOW (ref 30.0–36.0)
MCV: 87.5 fL (ref 78.0–100.0)
Platelets: 216 10*3/uL (ref 150–400)
RBC: 3.6 MIL/uL — ABNORMAL LOW (ref 4.22–5.81)
RDW: 23.8 % — ABNORMAL HIGH (ref 11.5–15.5)
WBC: 6.7 10*3/uL (ref 4.0–10.5)

## 2018-05-27 LAB — POCT INR: INR: 1.7 — AB (ref 2–3)

## 2018-06-03 ENCOUNTER — Ambulatory Visit (HOSPITAL_COMMUNITY): Payer: Self-pay | Admitting: Pharmacist

## 2018-06-03 LAB — POCT INR: INR: 1.9 — AB (ref 2.0–3.0)

## 2018-06-06 ENCOUNTER — Encounter (HOSPITAL_COMMUNITY): Payer: Medicare Other

## 2018-06-09 ENCOUNTER — Other Ambulatory Visit (HOSPITAL_COMMUNITY): Payer: Self-pay | Admitting: Unknown Physician Specialty

## 2018-06-09 DIAGNOSIS — Z7901 Long term (current) use of anticoagulants: Secondary | ICD-10-CM

## 2018-06-09 DIAGNOSIS — Z95811 Presence of heart assist device: Secondary | ICD-10-CM

## 2018-06-10 ENCOUNTER — Other Ambulatory Visit: Payer: Self-pay

## 2018-06-10 ENCOUNTER — Ambulatory Visit (HOSPITAL_COMMUNITY): Payer: Medicare Other | Admitting: Anesthesiology

## 2018-06-10 ENCOUNTER — Encounter (HOSPITAL_COMMUNITY): Payer: Self-pay

## 2018-06-10 ENCOUNTER — Ambulatory Visit (HOSPITAL_BASED_OUTPATIENT_CLINIC_OR_DEPARTMENT_OTHER)
Admit: 2018-06-10 | Discharge: 2018-06-10 | Disposition: A | Discharge status: Home / Self Care | Payer: Medicare Other | Attending: Cardiology | Admitting: Cardiology

## 2018-06-10 ENCOUNTER — Encounter (HOSPITAL_COMMUNITY)
Admission: AD | Disposition: A | Discharge status: Home / Self Care | Payer: Self-pay | Source: Ambulatory Visit | Attending: Internal Medicine

## 2018-06-10 ENCOUNTER — Ambulatory Visit (HOSPITAL_COMMUNITY): Payer: Self-pay | Admitting: Pharmacist

## 2018-06-10 ENCOUNTER — Encounter (HOSPITAL_COMMUNITY): Payer: Self-pay | Admitting: Unknown Physician Specialty

## 2018-06-10 ENCOUNTER — Ambulatory Visit (HOSPITAL_COMMUNITY)
Admission: AD | Admit: 2018-06-10 | Discharge: 2018-06-10 | Disposition: A | Discharge status: Home / Self Care | Payer: Medicare Other | Source: Ambulatory Visit | Attending: Internal Medicine | Admitting: Internal Medicine

## 2018-06-10 VITALS — BP 82/0 | HR 92 | Ht 72.0 in | Wt 175.4 lb

## 2018-06-10 DIAGNOSIS — I739 Peripheral vascular disease, unspecified: Secondary | ICD-10-CM | POA: Insufficient documentation

## 2018-06-10 DIAGNOSIS — Z95811 Presence of heart assist device: Secondary | ICD-10-CM | POA: Diagnosis not present

## 2018-06-10 DIAGNOSIS — I255 Ischemic cardiomyopathy: Secondary | ICD-10-CM | POA: Insufficient documentation

## 2018-06-10 DIAGNOSIS — Z7901 Long term (current) use of anticoagulants: Secondary | ICD-10-CM | POA: Insufficient documentation

## 2018-06-10 DIAGNOSIS — D494 Neoplasm of unspecified behavior of bladder: Secondary | ICD-10-CM | POA: Diagnosis not present

## 2018-06-10 DIAGNOSIS — I4892 Unspecified atrial flutter: Secondary | ICD-10-CM | POA: Diagnosis not present

## 2018-06-10 DIAGNOSIS — I132 Hypertensive heart and chronic kidney disease with heart failure and with stage 5 chronic kidney disease, or end stage renal disease: Secondary | ICD-10-CM | POA: Diagnosis not present

## 2018-06-10 DIAGNOSIS — Z87891 Personal history of nicotine dependence: Secondary | ICD-10-CM | POA: Insufficient documentation

## 2018-06-10 DIAGNOSIS — Z955 Presence of coronary angioplasty implant and graft: Secondary | ICD-10-CM | POA: Insufficient documentation

## 2018-06-10 DIAGNOSIS — I4891 Unspecified atrial fibrillation: Secondary | ICD-10-CM | POA: Diagnosis not present

## 2018-06-10 DIAGNOSIS — N186 End stage renal disease: Secondary | ICD-10-CM | POA: Insufficient documentation

## 2018-06-10 DIAGNOSIS — Z8673 Personal history of transient ischemic attack (TIA), and cerebral infarction without residual deficits: Secondary | ICD-10-CM | POA: Diagnosis not present

## 2018-06-10 DIAGNOSIS — I471 Supraventricular tachycardia: Secondary | ICD-10-CM | POA: Diagnosis not present

## 2018-06-10 DIAGNOSIS — I480 Paroxysmal atrial fibrillation: Secondary | ICD-10-CM | POA: Insufficient documentation

## 2018-06-10 DIAGNOSIS — I5022 Chronic systolic (congestive) heart failure: Secondary | ICD-10-CM | POA: Diagnosis not present

## 2018-06-10 DIAGNOSIS — I252 Old myocardial infarction: Secondary | ICD-10-CM | POA: Diagnosis not present

## 2018-06-10 DIAGNOSIS — E039 Hypothyroidism, unspecified: Secondary | ICD-10-CM | POA: Diagnosis not present

## 2018-06-10 DIAGNOSIS — M1990 Unspecified osteoarthritis, unspecified site: Secondary | ICD-10-CM | POA: Diagnosis not present

## 2018-06-10 DIAGNOSIS — E785 Hyperlipidemia, unspecified: Secondary | ICD-10-CM | POA: Diagnosis not present

## 2018-06-10 DIAGNOSIS — I2510 Atherosclerotic heart disease of native coronary artery without angina pectoris: Secondary | ICD-10-CM | POA: Diagnosis not present

## 2018-06-10 DIAGNOSIS — G4730 Sleep apnea, unspecified: Secondary | ICD-10-CM | POA: Insufficient documentation

## 2018-06-10 DIAGNOSIS — Z95810 Presence of automatic (implantable) cardiac defibrillator: Secondary | ICD-10-CM | POA: Diagnosis not present

## 2018-06-10 DIAGNOSIS — G4700 Insomnia, unspecified: Secondary | ICD-10-CM | POA: Diagnosis not present

## 2018-06-10 HISTORY — PX: CARDIOVERSION: SHX1299

## 2018-06-10 LAB — BASIC METABOLIC PANEL
Anion gap: 10 (ref 5–15)
BUN: 30 mg/dL — AB (ref 8–23)
CALCIUM: 9.3 mg/dL (ref 8.9–10.3)
CO2: 23 mmol/L (ref 22–32)
CREATININE: 1.49 mg/dL — AB (ref 0.61–1.24)
Chloride: 104 mmol/L (ref 98–111)
GFR calc Af Amer: 50 mL/min — ABNORMAL LOW (ref >60)
GFR, EST NON AFRICAN AMERICAN: 43 mL/min — AB (ref >60)
Glucose, Bld: 113 mg/dL — ABNORMAL HIGH (ref 70–99)
Potassium: 3.9 mmol/L (ref 3.5–5.1)
SODIUM: 137 mmol/L (ref 135–145)

## 2018-06-10 LAB — PROTIME-INR
INR: 1.94
PROTHROMBIN TIME: 22 s — AB (ref 11.4–15.2)

## 2018-06-10 LAB — CBC
HCT: 36.5 % — ABNORMAL LOW (ref 39.0–52.0)
Hemoglobin: 11.1 g/dL — ABNORMAL LOW (ref 13.0–17.0)
MCH: 27.4 pg (ref 26.0–34.0)
MCHC: 30.4 g/dL (ref 30.0–36.0)
MCV: 90.1 fL (ref 78.0–100.0)
PLATELETS: 173 10*3/uL (ref 150–400)
RBC: 4.05 MIL/uL — ABNORMAL LOW (ref 4.22–5.81)
RDW: 25.1 % — AB (ref 11.5–15.5)
WBC: 6.6 10*3/uL (ref 4.0–10.5)

## 2018-06-10 LAB — LACTATE DEHYDROGENASE: LDH: 252 U/L — AB (ref 98–192)

## 2018-06-10 SURGERY — CARDIOVERSION
Anesthesia: General

## 2018-06-10 MED ORDER — SODIUM CHLORIDE 0.9 % IV SOLN
INTRAVENOUS | Status: DC
  Administered 2018-06-10: 500 mL via INTRAVENOUS

## 2018-06-10 MED ORDER — PHENYLEPHRINE 40 MCG/ML (10ML) SYRINGE FOR IV PUSH (FOR BLOOD PRESSURE SUPPORT)
PREFILLED_SYRINGE | INTRAVENOUS | Status: DC | PRN
  Administered 2018-06-10: 80 ug via INTRAVENOUS

## 2018-06-10 MED ORDER — LIDOCAINE 2% (20 MG/ML) 5 ML SYRINGE
INTRAMUSCULAR | Status: DC | PRN
  Administered 2018-06-10: 50 mg via INTRAVENOUS

## 2018-06-10 MED ORDER — PROPOFOL 10 MG/ML IV BOLUS
INTRAVENOUS | Status: DC | PRN
  Administered 2018-06-10: 60 mg via INTRAVENOUS

## 2018-06-10 NOTE — Interval H&P Note (Signed)
History and Physical Interval Note:  06/10/2018 12:16 PM  [name]  has presented today for surgery, with the diagnosis of atrial fibrillation  The various methods of treatment have been discussed with the patient and family. After consideration of risks, benefits and other options for treatment, the patient has consented to  Procedure(s): CARDIOVERSION (N/A) as a surgical intervention .  The patient's history has been reviewed, patient examined, no change in status, stable for surgery.  I have reviewed the patient's chart and labs.  Questions were answered to the patient's satisfaction.     [name]

## 2018-06-10 NOTE — Progress Notes (Signed)
VAD Coordinator Procedure Note:   VAD Coordinator met patient in endoscopy. Pt undergoing cardioversion per Dr. Haroldine Laws.  Hemodynamics and VAD parameters monitored by myself and anesthesia throughout the procedure. Blood pressures were obtained with automatic cuff on right arm.    Time: Doppler Auto  BP Flow PI Power Speed  Pre-procedure:  11:30  108/84 (92) 3.5 6.9 4.3 8800   11:30  118/93 (99) 3.4 7.6 4.2            Secation Induction: 11:40  99/78  (84) 3.2 6.8 4.1    11:45  103/82 (87) 3.4 7.7 4.2                     Recovery Area: 11:50  99/81  (85) 3.4 7.5 4.2              Patient tolerated the procedure well.   Wife at bedside. Dr. Haroldine Laws instructed her to increase Amiodarone to 400 mg daily for 2 weeks then decrease to 200 mg daily.   Per Erin Hearing, PharmD pt is to take 2 tablets daily for next two days then resume one tablet daily. Pt will re-check INR in one week.  Wife verbalized understanding of above instructions.    Patient Disposition: home  [place], VAD Coordinator  24/7 VAD Pager: [phone]

## 2018-06-10 NOTE — Progress Notes (Signed)
Patient presents for hospital follow up in [facility] today with caregiver, [name].  Reports no problems with VAD equipment or concerns with drive line exit site.   Vital Signs:  Doppler Pressure: 82 Automatc BP:  99/85 (91) HR: 106 SPO2: 94% RA  Weight: 175.4 lb w/o eqt Last weight: 186.4  lb  VAD Indication: Destination Therapy- age excluding    VAD interrogation & Equipment Management: Speed:8800 Flow:  3.1 Power: 4.2w    PI: 6.4  Alarms: no clinical alarms Events: rare PI event  Fixed speed 8800 Low speed limit: 8200  Primary Controller: Replace back up battery in 27 months Back up controller: Replace back up battery in 69months  Exit Site Care: Drive line is being maintained weekly by [name]. Drive line exit site well healed and incorporated. The velour is fully implanted at exit site. Dressing dry and intact. No erythema or drainage. Stabilization device present and accurately applied. Pt denies fever or chills. Pt given 4 dressing kits today.   Significant Events on VAD Support:  02/2015 >>GIB (removed ASA, 2-2.5 INR) 10/2015 >>CVA(Plavix started) 02/2017> GIB (removed ASA, scopes negative) 03/2017> percutaneous lead repair 12/2017> Bladder CA  Device:[employer]tr[employer] Therapies: on 200 bpm AT monitor: 140 bpm Last check: 03/03/18  BP & Labs:  MAP 82 - Doppler is reflecting modified systolic  Hgb 9.1- No S/S of bleeding. Specifically denies melena/BRBPR or nosebleeds. No blood in urine.  LDH stable at 281with established baseline of 280-380. Denies tea-colored urine. No power elevations noted on interrogation.    Patient Instructions: 1.    Tanda Rockers RN [place] Coordinator   Office: [phone] 24/7 Emergency VAD Pager: [phone]

## 2018-06-10 NOTE — H&P (View-Only) (Signed)
Patient presents for hospital follow up in [facility] today with caregiver, [name].  Reports no problems with VAD equipment or concerns with drive line exit site.   Vital Signs:  Doppler Pressure: 82 Automatc BP:  99/85 (91) HR: 106 SPO2: 94% RA  Weight: 175.4 lb w/o eqt Last weight: 186.4  lb  VAD Indication: Destination Therapy- age excluding    VAD interrogation & Equipment Management: Speed:8800 Flow:  3.1 Power: 4.2w    PI: 6.4  Alarms: no clinical alarms Events: rare PI event  Fixed speed 8800 Low speed limit: 8200  Primary Controller: Replace back up battery in 27 months Back up controller: Replace back up battery in 71months  Exit Site Care: Drive line is being maintained weekly by [name]. Drive line exit site well healed and incorporated. The velour is fully implanted at exit site. Dressing dry and intact. No erythema or drainage. Stabilization device present and accurately applied. Pt denies fever or chills. Pt given 4 dressing kits today.   Significant Events on VAD Support:  02/2015 >>GIB (removed ASA, 2-2.5 INR) 10/2015 >>CVA(Plavix started) 02/2017> GIB (removed ASA, scopes negative) 03/2017> percutaneous lead repair 12/2017> Bladder CA  Device:[employer]tronic Therapies: on 200 bpm AT monitor: 140 bpm Last check: 03/03/18  BP & Labs:  MAP 82 - Doppler is reflecting modified systolic  Hgb 9.1- No S/S of bleeding. Specifically denies melena/BRBPR or nosebleeds. No blood in urine.  LDH stable at 281with established baseline of 280-380. Denies tea-colored urine. No power elevations noted on interrogation.    Patient Instructions: 1.    Tanda Rockers RN [place] Coordinator   Office: [phone] 24/7 Emergency VAD Pager: [phone]

## 2018-06-10 NOTE — Transfer of Care (Signed)
Immediate Anesthesia Transfer of Care Note  Patient: [name]  Procedure(s) Performed: CARDIOVERSION (N/A )  Patient Location: PACU and Endoscopy Unit  Anesthesia Type:General  Level of Consciousness: patient cooperative and responds to stimulation  Airway & Oxygen Therapy: Patient Spontanous Breathing  Post-op Assessment: Report given to RN and Post -op Vital signs reviewed and stable  Post vital signs: Reviewed and stable  Last Vitals:  Vitals Value Taken Time  BP    Temp    Pulse    Resp    SpO2      Last Pain:  Vitals:   06/10/18 1200  TempSrc: Oral  PainSc: 0-No pain         Complications: No apparent anesthesia complications

## 2018-06-10 NOTE — Progress Notes (Signed)
Patient ID: [name], male   DOB: [birth year], 77 y.o.   MRN: [id number]   VAD CLINIC PROGRESS NOTE  PCP: Dr. Kandice Robinsons ([place]) GU: Dr Gloriann Loan Neuro Surgeon: Dr Vertell Limber Vascular: Dr Kellie Simmering Primary HF: Dr Darlyn Read [name] is a 77 y.o. male  with h/o VT, PAD and severe systolic HF (EF 81-19%) due to mixed ischemic/nonischemic CM he is s/p HM II LVAD implant for destination therapy on October 19, 2013.VAD implantation was complicated by VT, syncope, AF/Aflutter and RHF. Required short term milrinone.   GU Event 09/01/14 had impacted ureteral stone and underwent cystoscopy and flexible ureteroscopy with lithotripsy and basket extraction.  09/2015 Hematuria and chronic nephrolithiasis. Renal US showed mild hyronephrosis and bilateral renal cysts. Dr. Risa Grill took back for repeat lithotripsy and stone extraction by ureteroscopy and J stent placement. 12/18 Recurrent hematuria. 33mm left ureteral stone. Passed with tamulosin 1/19   Admitted with recurrent hematuria with ureteral stone. Stone removed but found to have large bladder mass which was removed. Course c/b obstructive uropathy and AKI requiring 1 session of HD.  3/19 Bladder chemo stopped after 2 treatments as unable to tolerate 7/12 s/p TURB with right ureteral stent placement.   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  4/2-18 - Episode of melena. Hgb 10.9-> 8.7. EGD/colonoscopy negative 12/18 - Melena with syncope  Hgb 6.5. Transfused 5u. 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  01/2016 R CEA. He was started on Plavix.  01/2016 Lumbar fusion L3-L5 with pedicle screws posterolateral arthrodesis with BMP, allograft  VT events 9/17 with high fevers and productive cough. Treated with levaquin x 7 days for acute bronchitis. Also found to have VT and underwent DC-CV. Seen by EP and amio increased 10/17 speed turned down to 8800 due to RV  failure. 10/18 with slow VT and ADHF.Cardioverted in ED and amio uptitrated.  04/01/18 recurrent VT. Underwent DC-CV in Er  Significant VAD Events  03/2017>percutaneous lead repair   Follow up LVAD Clinic: He returns today for regular follow up. Had been doing well overall, but woke up this am feeling tired and weak.  Chronic DOE that is unchanged. Denies orthopnea or PND. No fevers, chills No VAD alarms. Denies bleeding. No melena or neuro symptoms.Taking all meds as prescribed.  Medtronic rep interrogated in clinic by rep. Shows to have been in aflutter for past 26 hrs, with underlying rate of 200 (atrial) and controlled ventricular rate from 95-105.  Medtronic: Thoracic impedence above treshold. Optivol below index. Atrial arryhtmia since midday yesterday. Pt activity < 30 minutes daily.    VAD Indication: Destination Therapy- age excluding    VAD interrogation & Equipment Management: Speed: 8800 Flow: 3.1 Power: 4.2 w PI: 6.4  Alarms: None Events: Rare PI event  Fixed speed 8800 Low speed limit: 8200  Primary Controller: Replace back up battery in 29 months Back up controller: Replace back up battery in 41months   Exit Site Care: Drive line is being maintained weekly by [name]. Drive line exit site well healed and incorporated. The velour is fully implanted at exit site. Dressing dry and intact. No erythema or drainage. Stabilization device present and accurately applied. Pt denies fever or chills. Pt given 6 weekly dressing kits at this visit.   I reviewed the LVAD parameters from today, and compared the results to the patient's prior recorded data.  No  programming changes were made.  The LVAD is functioning within specified parameters.  The patient performs LVAD self-test daily.  LVAD interrogation was negative for any significant power changes, alarms or PI events/speed drops.  LVAD equipment check completed and is in good working order.  Back-up equipment present.    LVAD education done on emergency procedures and precautions and reviewed exit site care.    Past Medical History:  Diagnosis Date  . Arthritis   . Automatic implantable cardioverter-defibrillator in situ    a. s/p Medtronic Evera device serial [id number]    . Bradycardia   . CAD (coronary artery disease)    a. s/p MI 1982;  s/p DES to CFX 2011;  s/p NSTEMI 4/12 in setting of VTach;  cath 7/12:  LM ok, LAD mid 30-40%, Dx's with 40%, mCFX stent patent, prox to mid RCA occluded with R to R and L to R collats.  Medical Tx was continued  . Chronic systolic heart failure ([place])    a. s/p LVAD 10/2013 for destination therapy  . CKD (chronic kidney disease)   . CVD (cerebrovascular disease)    a. s/p L CEA 2008  . Cytopenia   . Dysrhythmia    AFIB  . History of GI bleed    a. on ASA- started on plavix during 10/2015 admission for CVA  . HLD (hyperlipidemia)   . HTN (hypertension)   . Hypothyroidism   . Inappropriate therapy from implantable cardioverter-defibrillator    a. ATP for sinus tach SVT wavelet identified SVT but was no  passive (2014)  . Ischemic cardiomyopathy   . 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 L Ear   . Myocardial infarction ([place])    1992  . PAF (paroxysmal atrial fibrillation) ([place])   . PVD (peripheral vascular disease) ([place])    a. 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 Shortness of breath dyspnea    W/ EXERTION   . Sleep apnea    NO CPAP NOW  HE SENT IT BACK YRS AGO  . Stroke [facility])    a. 10/2015 admission   . Ventricular tachycardia ([place])    a. s/p AICD;   h/o ICD shock;  Amiodarone Rx. S/P ablation in 2012    Current Outpatient Medications  Medication Sig Dispense Refill  . amiodarone (PACERONE) 200 MG tablet Take 200 mg by mouth daily.     [name] atorvastatin (LIPITOR) 80 MG tablet TAKE 1 TABLET DAILY AT 6 PM (Patient taking differently: Take 80 mg by mouth in the evening)  30 tablet 11  . Chlorpheniramine-DM (CORICIDIN COUGH/COLD) 4-30 MG TABS Take 1 tablet by mouth every 6 (six) hours as needed (for congestion and coughing).     . Dextromethorphan-guaiFENesin (ROBITUSSIN COUGH+CHEST CONG DM PO) Take 20 mLs by mouth every 6 (six) hours as needed (for congestion and coughing).     [name] docusate sodium (COLACE) 100 MG capsule Take 200 mg by mouth daily.     . furosemide (LASIX) 20 MG tablet Take 20 mg by mouth daily as needed for fluid.    [name] levothyroxine (SYNTHROID, LEVOTHROID) 150 MCG tablet Take 1 tablet (150 mcg total) by mouth daily before breakfast. 90 tablet 3  . Multiple Vitamins-Minerals (ICAPS PO) Take 1 tablet by mouth 2 (two) times daily.    . Multiple Vitamins-Minerals (MULTIVITAMIN PO) Take 1  tablet by mouth daily.    . pantoprazole (PROTONIX) 40 MG tablet TAKE 1 TABLET TWICE DAILY 180 tablet 6  . promethazine (PHENERGAN) 12.5 MG tablet Take 12.5 mg by mouth every 6 (six) hours as needed for nausea or vomiting.    . ranolazine (RANEXA) 500 MG 12 hr tablet Take 1 tablet (500 mg total) by mouth 2 (two) times daily. 60 tablet 11  . sildenafil (REVATIO) 20 MG tablet Take 1 tablet (20 mg total) by mouth 3 (three) times daily. 90 tablet 11  . temazepam (RESTORIL) 30 MG capsule Take 1 capsule (30 mg total) by mouth at bedtime as needed for sleep. 30 capsule 3  . warfarin (COUMADIN) 2 MG tablet Take 2 mg by mouth every evening.     . zinc gluconate 50 MG tablet Take 50 mg by mouth 2 (two) times daily.      No current facility-administered medications for this encounter.     Demerol [meperidine]  Review of systems complete and found to be negative unless listed in HPI.    Vitals:   06/10/18 1015 06/10/18 1016  BP: 99/85 (!) 82/0  Pulse: 92   SpO2: 94%   Weight: 175 lb 6.4 oz (79.6 kg)   Height: 6' (1.829 m)     Vital Signs:  Doppler Pressure: 82  Automatc BP:  99/85 (91) HR: 92 SPO2: 94% RA  Weight: 175 lb w/o eqt Last weight:  189.4   Physical exam: General: Well appearing this am. NAD.  HEENT: Normal. Neck: Supple, JVP 7-8 cm. Carotids OK.  Cardiac:  Mechanical heart sounds with LVAD hum present.  Lungs:  CTAB, normal effort.  Abdomen:  NT, ND, no HSM. No bruits or masses. +BS  LVAD exit site: Well-healed and incorporated. Dressing dry and intact. No erythema or drainage. Stabilization device present and accurately applied. Driveline dressing changed daily per sterile technique. Extremities:  Warm and dry. No cyanosis, clubbing, rash, or edema.  Neuro:  Alert & oriented x 3. Cranial nerves grossly intact. Moves all 4 extremities w/o difficulty. Affect pleasant     ASSESSMENT AND PLAN:  1. Chronic systolic HF: s/p HM-II LVAD 10/2014.  - Recent RHC in 1/19 with mildly low output. Started on milrinone without benefit.  - NYHA III symptoms - Volume status stable on exam.   - Encouraged him to take lasix as needed and not to wait - VAD interrogated personally. Parameters stable.  Occasional PI events. Low flow event not recorded.  (LVAD speed reduced to 8800 in 10/17 due to RV failure) - s/p previous driveline repair. No pump stops 2.VT  - underwent DC-CV 9/17.  - repeat episode of slow VT 08/17/17. Requiring DC-CV.  - repeat VT 04/01/18 requiring DC-CV. amio increased to 200 bid  - Continue amio 200 daily - Keep K > 4.0 Mg > 2.0 - ICD interrogated today personally. No further VT episodes. Volume status ok. Activity level 1 hour/day or less 3. 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  - Had 2 BCG infusions with Dr. Gloriann Loan but discontinued due to inability to tolerate - 7/12 s/p TURB with right ureteral stent placement.  4. 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 Required HD x1 - Creatinine pending today.  5. 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 Continue coumadin and  plavix.(with h/o CVA). - Admitted 4/18 with  recurrent GIB. Transfused 2u. EGD/colon negative - Admitted 12./18. hgb 6.5, Transfused 5u. EGD with 2 AVMs treated with APC - Denies bleeding.  - Hgb 11.1 today - Has been getting octreotide infusions but too expensive for him. Will cotninue to  hold for now  6. Atrial arrhythmias: Atrial fibrillation, atrial tachycardia.  S/p DC-CV 06/04/14, DC-CV 03/02/15 and 01/19/16.  - In aflutter today. Have arranged for him to get DCCV at 1 pm this afternoon.  - Continue low-dose amio. May need to "re-bolus" 7. HTN:  - MAPs stable.  8. LVAD:  - VAD interrogated personally. Parameters stable.   - LDH pending 9. Anticoagulation management:  - Goal INR 2-2.5 INR 1.94. Dosing per PharmD.  10. Lumbar compression fracture:  - s/p L3-L5 fusion 02/03/16. - Stable.  11. CVA: -  12/16 with left-sided weakness, now resolved.  Continue atorvastatin. (also warfarin INR goal 2-2.5). S/p R CEA 01/13/16. Plavix stopped 12/18 due to recurrent GIB 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 Forde Dandy in past.  - Amio restarted due to recurrent atrial arrhythmias and VT.  - Recent FT4 elevated. Synthroid cut to 150 daily in 12/18.  - WIll need TSH/T3/T4 rechecked.   13. Insomnia - OK to continue temazepam 30 qhs PRN  In a-flutter today. Plan for DCCV and then close follow up. BMET and LDH pending.   Shirley Friar, PA-C  9:48 AM   Patient seen and examined with the above-signed Advanced Practice Provider and/or Housestaff. I personally reviewed laboratory data, imaging studies and relevant notes. I independently examined the patient and formulated the important aspects of the plan. I have edited the note to reflect any of my changes or salient points. I have personally discussed the plan with the patient and/or family.  Presented to clinic today for routine f/u. Has been recovering from recent removal of bladder tumor but feels weak over past 24 hours.  In clinic found to be in recurrent AFL/atrial tach. We attempted overdrive pacing but failed. ICD interrogation shows he has been in this rhythm ~ 24 hours. INR subtherapeutic but given < 24 hours ok to DC-CV. Volume status looks ok. Labs stable. On exam no JVD. Cor VAD hum No edema. VAD interrogated personally. Parameters stable.  Will plan DC-CV today. I discussed with Endoscopy personally.   Total time personally spent 45 minutes. Over half that time spent discussing above.    Glori Bickers, MD  12:03 PM

## 2018-06-10 NOTE — Anesthesia Postprocedure Evaluation (Signed)
Anesthesia Post Note  Patient: [name]  Procedure(s) Performed: CARDIOVERSION (N/A )     Patient location during evaluation: PACU Anesthesia Type: General Level of consciousness: awake and alert Pain management: pain level controlled Vital Signs Assessment: post-procedure vital signs reviewed and stable Respiratory status: spontaneous breathing, nonlabored ventilation and respiratory function stable Cardiovascular status: blood pressure returned to baseline and stable Postop Assessment: no apparent nausea or vomiting Anesthetic complications: no    Last Vitals:  Vitals:   06/10/18 1300 06/10/18 1310  BP: 97/74 90/73  Pulse: (!) 59   Resp: 16 13  Temp:    SpO2: 96% 96%    Last Pain:  Vitals:   06/10/18 1310  TempSrc:   PainSc: 0-No pain                 Audry Pili

## 2018-06-10 NOTE — Progress Notes (Signed)
Patient presents for hospital follow up in [facility] today with caregiver, [name].  Reports no problems with VAD equipment or concerns with drive line exit site.   Pt states that he is feeling terrible today, no energy and "can barely make it." Pts flow is lower today than normal.   Pt is in aflutter, Brandon from [place] called to interrogate pts device and attempt to pace pt out of aflutter. Erlene Quan unable to to pace pt out. Will schedule cardioversion in Endo per Dr. Haroldine Laws.  Vital Signs:  Doppler Pressure: 82 Automatc BP:  99/85 (91) HR: 106-aflutter SPO2: 94% RA  Weight: 175.4 lb w/o eqt Last weight: 186.4  lb  VAD Indication: Destination Therapy- age excluding    VAD interrogation & Equipment Management: Speed:8800 Flow:  3.1 Power: 4.2w    PI: 6.4  Alarms: no clinical alarms Events: rare PI event  Fixed speed 8800 Low speed limit: 8200  Primary Controller: Replace back up battery in 27 months Back up controller: Replace back up battery in 37months  Exit Site Care: Drive line is being maintained weekly by [name]. Drive line exit site well healed and incorporated. The velour is fully implanted at exit site. Dressing dry and intact. No erythema or drainage. Stabilization device present and accurately applied. Pt denies fever or chills. Pt given 4 dressing kits today.   Significant Events on VAD Support:  02/2015 >>GIB (removed ASA, 2-2.5 INR) 10/2015 >>CVA(Plavix started) 02/2017> GIB (removed ASA, scopes negative) 03/2017> percutaneous lead repair 12/2017> Bladder CA  Device:Medtronic Therapies: on 200 bpm AT monitor: 140 bpm Last check: today  BP & Labs:  MAP 82 - Doppler is reflecting modified systolic  Hgb 82.4- No S/S of bleeding. Specifically denies melena/BRBPR or nosebleeds. No blood in urine.  LDH stable at 252with established baseline of 280-380. Denies tea-colored urine. No power elevations noted on interrogation.    Patient  Instructions: 1. Pt will be cardioverted today in Endo w/DB. 2. Return to clinic in 3-4 weeks.   Tanda Rockers RN [place] Coordinator   Office: [phone] 24/7 Emergency VAD Pager: [phone]

## 2018-06-10 NOTE — Anesthesia Preprocedure Evaluation (Addendum)
Anesthesia Evaluation  Patient identified by MRN, date of birth, ID band Patient awake    Reviewed: Allergy & Precautions, NPO status , Patient's Chart, lab work & pertinent test results  History of Anesthesia Complications Negative for: history of anesthetic complications  Airway Mallampati: II  TM Distance: >3 FB Neck ROM: Full    Dental  (+) Edentulous Upper, Edentulous Lower   Pulmonary sleep apnea , former smoker,    breath sounds clear to auscultation       Cardiovascular hypertension, Pt. on medications + CAD, + Past MI, + Peripheral Vascular Disease and +CHF  + dysrhythmias Atrial Fibrillation + Cardiac Defibrillator  Rhythm:Irregular  '19 RHC - Biventricular failure despite VAD support  '19 TTE - LV cavity size was mildly dilated. LVAD present.  Aortic root is mildly dilated at 41 mm. LA was severely dilated. RV cavity size was mildly dilated. Systolic function was moderately to severely reduced. RA was mildly dilated.  Difficult to auscultate due to whirring sound from LVAD   Neuro/Psych TIAnegative psych ROS   GI/Hepatic negative GI ROS, Neg liver ROS,   Endo/Other  Hypothyroidism   Renal/GU   negative genitourinary   Musculoskeletal  (+) Arthritis ,   Abdominal   Peds  Hematology  (+) anemia ,   Anesthesia Other Findings   Reproductive/Obstetrics                           Anesthesia Physical Anesthesia Plan  ASA: III  Anesthesia Plan: General   Post-op Pain Management:    Induction: Intravenous  PONV Risk Score and Plan: 2 and Propofol infusion and Treatment may vary due to age or medical condition  Airway Management Planned: Natural Airway and Mask  Additional Equipment: None  Intra-op Plan:   Post-operative Plan:   Informed Consent: I have reviewed the patients History and Physical, chart, labs and discussed the procedure including the risks, benefits and  alternatives for the proposed anesthesia with the patient or authorized representative who has indicated his/her understanding and acceptance.     Plan Discussed with: CRNA and Anesthesiologist  Anesthesia Plan Comments:        Anesthesia Quick Evaluation

## 2018-06-10 NOTE — H&P (View-Only) (Signed)
Patient presents for hospital follow up in [facility] today with caregiver, [name].  Reports no problems with VAD equipment or concerns with drive line exit site.   Vital Signs:  Doppler Pressure: 82 Automatc BP:  99/85 (91) HR: 106 SPO2: 94% RA  Weight: 175.4 lb w/o eqt Last weight: 186.4  lb  VAD Indication: Destination Therapy- age excluding    VAD interrogation & Equipment Management: Speed:8800 Flow:  3.1 Power: 4.2w    PI: 6.4  Alarms: no clinical alarms Events: rare PI event  Fixed speed 8800 Low speed limit: 8200  Primary Controller: Replace back up battery in 27 months Back up controller: Replace back up battery in 4months  Exit Site Care: Drive line is being maintained weekly by [name]. Drive line exit site well healed and incorporated. The velour is fully implanted at exit site. Dressing dry and intact. No erythema or drainage. Stabilization device present and accurately applied. Pt denies fever or chills. Pt given 4 dressing kits today.   Significant Events on VAD Support:  02/2015 >>GIB (removed ASA, 2-2.5 INR) 10/2015 >>CVA(Plavix started) 02/2017> GIB (removed ASA, scopes negative) 03/2017> percutaneous lead repair 12/2017> Bladder CA  Device:Medtronic Therapies: on 200 bpm AT monitor: 140 bpm Last check: 03/03/18  BP & Labs:  MAP 82 - Doppler is reflecting modified systolic  Hgb 9.1- No S/S of bleeding. Specifically denies melena/BRBPR or nosebleeds. No blood in urine.  LDH stable at 281with established baseline of 280-380. Denies tea-colored urine. No power elevations noted on interrogation.    Patient Instructions: 1.    Tanda Rockers RN [place] Coordinator   Office: [phone] 24/7 Emergency VAD Pager: [phone]

## 2018-06-10 NOTE — CV Procedure (Signed)
     DIRECT CURRENT CARDIOVERSION  NAME:  [name]   MRN: [id number] DOB:  [birth year]   ADMIT DATE: 06/10/2018   INDICATIONS: Atrial fibrillation    PROCEDURE:   Informed consent was obtained prior to the procedure. The risks, benefits and alternatives for the procedure were discussed and the patient comprehended these risks. VAD coordinator present for entire procedure to monitor VAD parameters. Once an appropriate time out was taken, the patient had the defibrillator pads placed in the anterior and posterior position. The patient then underwent sedation by the anesthesia service. Once an appropriate level of sedation was achieved, the patient received a single biphasic, synchronized 150J shock with prompt conversion to sinus rhythm. No apparent complications.  Glori Bickers, MD  12:52 PM

## 2018-06-10 NOTE — Discharge Instructions (Signed)
Electrical Cardioversion, Care After °This sheet gives you information about how to care for yourself after your procedure. Your health care provider may also give you more specific instructions. If you have problems or questions, contact your health care provider. °What can I expect after the procedure? °After the procedure, it is common to have: °· Some redness on the skin where the shocks were given. ° °Follow these instructions at home: °· Do not drive for 24 hours if you were given a medicine to help you relax (sedative). °· Take over-the-counter and prescription medicines only as told by your health care provider. °· Ask your health care provider how to check your pulse. Check it often. °· Rest for 48 hours after the procedure or as told by your health care provider. °· Avoid or limit your caffeine use as told by your health care provider. °Contact a health care provider if: °· You feel like your heart is beating too quickly or your pulse is not regular. °· You have a serious muscle cramp that does not go away. °Get help right away if: °· You have discomfort in your chest. °· You are dizzy or you feel faint. °· You have trouble breathing or you are short of breath. °· Your speech is slurred. °· You have trouble moving an arm or leg on one side of your body. °· Your fingers or toes turn cold or blue. °This information is not intended to replace advice given to you by your health care provider. Make sure you discuss any questions you have with your health care provider. °Document Released: 08/12/2013 Document Revised: 05/25/2016 Document Reviewed: 04/27/2016 °Elsevier Interactive Patient Education © 2018 Elsevier Inc. ° °

## 2018-06-16 ENCOUNTER — Telehealth: Payer: Self-pay | Admitting: Licensed Clinical Social Worker

## 2018-06-16 NOTE — Telephone Encounter (Signed)
CSW received multiple bills from patient's SO marie all stating denial for last hospitalization due to lack of prior approval. CSW contacted billing @ [phone] and informed that there are working on the authorization and case is pending. CSW contacted [name] to inform that authorization is pending. [name], [place], [place]

## 2018-06-17 ENCOUNTER — Ambulatory Visit (HOSPITAL_COMMUNITY): Payer: Self-pay | Admitting: Pharmacist

## 2018-06-17 LAB — POCT INR: INR: 2.4 (ref 2.0–3.0)

## 2018-06-24 ENCOUNTER — Ambulatory Visit (HOSPITAL_COMMUNITY): Payer: Self-pay | Admitting: Pharmacist

## 2018-06-24 LAB — POCT INR: INR: 1.9 — AB (ref 2.0–3.0)

## 2018-06-30 ENCOUNTER — Other Ambulatory Visit (HOSPITAL_COMMUNITY): Payer: Self-pay | Admitting: *Deleted

## 2018-06-30 ENCOUNTER — Telehealth: Payer: Self-pay | Admitting: *Deleted

## 2018-06-30 DIAGNOSIS — R413 Other amnesia: Secondary | ICD-10-CM

## 2018-06-30 MED ORDER — TEMAZEPAM 30 MG PO CAPS: 30.0000 mg | ORAL_CAPSULE | Freq: Every evening | ORAL | 3 refills | Status: DC | PRN

## 2018-06-30 NOTE — Telephone Encounter (Signed)
[place] and refilled prescription for Restoril 30mg  PRN bedtime.   Emerson Monte RN [place] Coordinator  24/7 Pager [phone]

## 2018-07-01 ENCOUNTER — Other Ambulatory Visit (HOSPITAL_COMMUNITY): Payer: Self-pay | Admitting: Unknown Physician Specialty

## 2018-07-01 DIAGNOSIS — Z7901 Long term (current) use of anticoagulants: Secondary | ICD-10-CM

## 2018-07-01 DIAGNOSIS — Z95811 Presence of heart assist device: Secondary | ICD-10-CM

## 2018-07-02 ENCOUNTER — Ambulatory Visit (HOSPITAL_COMMUNITY)
Admission: RE | Admit: 2018-07-02 | Discharge: 2018-07-02 | Disposition: A | Discharge status: Home / Self Care | Payer: Medicare Other | Source: Ambulatory Visit | Attending: Internal Medicine | Admitting: Internal Medicine

## 2018-07-02 ENCOUNTER — Ambulatory Visit (INDEPENDENT_AMBULATORY_CARE_PROVIDER_SITE_OTHER): Payer: Medicare Other | Admitting: *Deleted

## 2018-07-02 ENCOUNTER — Ambulatory Visit (HOSPITAL_BASED_OUTPATIENT_CLINIC_OR_DEPARTMENT_OTHER)
Admit: 2018-07-02 | Discharge: 2018-07-02 | Disposition: A | Discharge status: Home / Self Care | Payer: Medicare Other | Attending: Internal Medicine | Admitting: Internal Medicine

## 2018-07-02 ENCOUNTER — Telehealth: Payer: Self-pay | Admitting: *Deleted

## 2018-07-02 ENCOUNTER — Ambulatory Visit (HOSPITAL_COMMUNITY): Payer: Medicare Other | Admitting: Anesthesiology

## 2018-07-02 ENCOUNTER — Encounter (HOSPITAL_COMMUNITY): Payer: Self-pay | Admitting: *Deleted

## 2018-07-02 ENCOUNTER — Encounter (HOSPITAL_COMMUNITY)
Admission: RE | Disposition: A | Discharge status: Home / Self Care | Payer: Self-pay | Source: Ambulatory Visit | Attending: Internal Medicine

## 2018-07-02 ENCOUNTER — Ambulatory Visit (HOSPITAL_COMMUNITY): Payer: Self-pay | Admitting: Pharmacist

## 2018-07-02 ENCOUNTER — Encounter (HOSPITAL_COMMUNITY): Payer: Self-pay

## 2018-07-02 VITALS — BP 123/84 | HR 115

## 2018-07-02 DIAGNOSIS — I2510 Atherosclerotic heart disease of native coronary artery without angina pectoris: Secondary | ICD-10-CM | POA: Insufficient documentation

## 2018-07-02 DIAGNOSIS — E785 Hyperlipidemia, unspecified: Secondary | ICD-10-CM | POA: Insufficient documentation

## 2018-07-02 DIAGNOSIS — G4700 Insomnia, unspecified: Secondary | ICD-10-CM | POA: Diagnosis not present

## 2018-07-02 DIAGNOSIS — I472 Ventricular tachycardia, unspecified: Secondary | ICD-10-CM

## 2018-07-02 DIAGNOSIS — M1990 Unspecified osteoarthritis, unspecified site: Secondary | ICD-10-CM | POA: Diagnosis not present

## 2018-07-02 DIAGNOSIS — N183 Chronic kidney disease, stage 3 (moderate): Secondary | ICD-10-CM | POA: Insufficient documentation

## 2018-07-02 DIAGNOSIS — Z87891 Personal history of nicotine dependence: Secondary | ICD-10-CM | POA: Diagnosis not present

## 2018-07-02 DIAGNOSIS — Z8673 Personal history of transient ischemic attack (TIA), and cerebral infarction without residual deficits: Secondary | ICD-10-CM | POA: Insufficient documentation

## 2018-07-02 DIAGNOSIS — I130 Hypertensive heart and chronic kidney disease with heart failure and stage 1 through stage 4 chronic kidney disease, or unspecified chronic kidney disease: Secondary | ICD-10-CM | POA: Insufficient documentation

## 2018-07-02 DIAGNOSIS — I4892 Unspecified atrial flutter: Secondary | ICD-10-CM

## 2018-07-02 DIAGNOSIS — Z95810 Presence of automatic (implantable) cardiac defibrillator: Secondary | ICD-10-CM | POA: Diagnosis not present

## 2018-07-02 DIAGNOSIS — I5022 Chronic systolic (congestive) heart failure: Secondary | ICD-10-CM | POA: Diagnosis not present

## 2018-07-02 DIAGNOSIS — E039 Hypothyroidism, unspecified: Secondary | ICD-10-CM | POA: Diagnosis not present

## 2018-07-02 DIAGNOSIS — Z955 Presence of coronary angioplasty implant and graft: Secondary | ICD-10-CM | POA: Diagnosis not present

## 2018-07-02 DIAGNOSIS — I739 Peripheral vascular disease, unspecified: Secondary | ICD-10-CM | POA: Diagnosis not present

## 2018-07-02 DIAGNOSIS — I252 Old myocardial infarction: Secondary | ICD-10-CM | POA: Insufficient documentation

## 2018-07-02 DIAGNOSIS — I255 Ischemic cardiomyopathy: Secondary | ICD-10-CM

## 2018-07-02 DIAGNOSIS — G4730 Sleep apnea, unspecified: Secondary | ICD-10-CM | POA: Diagnosis not present

## 2018-07-02 DIAGNOSIS — Z95811 Presence of heart assist device: Secondary | ICD-10-CM | POA: Diagnosis not present

## 2018-07-02 DIAGNOSIS — Z981 Arthrodesis status: Secondary | ICD-10-CM | POA: Diagnosis not present

## 2018-07-02 DIAGNOSIS — Z7901 Long term (current) use of anticoagulants: Secondary | ICD-10-CM

## 2018-07-02 HISTORY — PX: CARDIOVERSION: SHX1299

## 2018-07-02 LAB — CBC
HCT: 37 % — ABNORMAL LOW (ref 39.0–52.0)
Hemoglobin: 11.5 g/dL — ABNORMAL LOW (ref 13.0–17.0)
MCH: 28.5 pg (ref 26.0–34.0)
MCHC: 31.1 g/dL (ref 30.0–36.0)
MCV: 91.6 fL (ref 78.0–100.0)
PLATELETS: 167 10*3/uL (ref 150–400)
RBC: 4.04 MIL/uL — AB (ref 4.22–5.81)
RDW: 22.9 % — ABNORMAL HIGH (ref 11.5–15.5)
WBC: 7.4 10*3/uL (ref 4.0–10.5)

## 2018-07-02 LAB — PROTIME-INR
INR: 2.07
Prothrombin Time: 23.2 seconds — ABNORMAL HIGH (ref 11.4–15.2)

## 2018-07-02 LAB — BASIC METABOLIC PANEL
ANION GAP: 9 (ref 5–15)
BUN: 35 mg/dL — ABNORMAL HIGH (ref 8–23)
CALCIUM: 9.2 mg/dL (ref 8.9–10.3)
CO2: 23 mmol/L (ref 22–32)
Chloride: 104 mmol/L (ref 98–111)
Creatinine, Ser: 1.42 mg/dL — ABNORMAL HIGH (ref 0.61–1.24)
GFR, EST AFRICAN AMERICAN: 53 mL/min — AB (ref >60)
GFR, EST NON AFRICAN AMERICAN: 46 mL/min — AB (ref >60)
Glucose, Bld: 107 mg/dL — ABNORMAL HIGH (ref 70–99)
Potassium: 4.2 mmol/L (ref 3.5–5.1)
Sodium: 136 mmol/L (ref 135–145)

## 2018-07-02 LAB — LACTATE DEHYDROGENASE: LDH: 235 U/L — AB (ref 98–192)

## 2018-07-02 SURGERY — CARDIOVERSION
Anesthesia: General

## 2018-07-02 MED ORDER — PROPOFOL 10 MG/ML IV BOLUS
INTRAVENOUS | Status: DC | PRN
  Administered 2018-07-02: 60 mg via INTRAVENOUS

## 2018-07-02 MED ORDER — PHENYLEPHRINE HCL 10 MG/ML IJ SOLN
INTRAMUSCULAR | Status: DC | PRN
  Administered 2018-07-02: 80 ug via INTRAVENOUS
  Administered 2018-07-02: 40 ug via INTRAVENOUS

## 2018-07-02 MED ORDER — LIDOCAINE HCL (CARDIAC) PF 100 MG/5ML IV SOSY
PREFILLED_SYRINGE | INTRAVENOUS | Status: DC | PRN
  Administered 2018-07-02: 60 mg via INTRAVENOUS

## 2018-07-02 MED ORDER — SODIUM CHLORIDE 0.9 % IV SOLN
INTRAVENOUS | Status: DC
  Administered 2018-07-02: 11:00:00 via INTRAVENOUS

## 2018-07-02 NOTE — Progress Notes (Signed)
VAD Coordinator Procedure Note:   VAD Coordinator met patient in endoscopy.  Pt undergoing cardioversion  per Dr. Haroldine Laws. Hemodynamics and VAD parameters monitored by myself and anesthesia throughout the procedure. Blood pressures were obtained with automatic cuff on right arm and correlated with Doppler. Heart rate 120s-130s a-flutter.     Time: Doppler Auto  BP Flow PI Power Speed HR  Pre-procedure:  1130  104/86 (90) 2.9 5.5 4.0 8800 132   11:45  (98) 2.8 5.9 3.9 8800 126            Secation Induction: 11:55  114/94 (99) 3.0 5.9 4.0 8800 115                                Recovery Area: 1200  107/91 (95) 3.0 6.8 4 8800 75   12:15  102/88 (91) 3.4 6.7 4.2 8800 75    12:18  94/73 (79) 3.4 6.7 4.2 8800 75     Defibrillated x1 at 11:56. Patient tolerated the procedure well. VAD Coordinator accompanied and remained with patient in recovery area.    Patient Disposition:   Stable and awaiting discharge home. Wife [name] at bedside.   Emerson Monte RN [place] Coordinator  Office: [phone]  24/7 Pager: [phone]

## 2018-07-02 NOTE — Transfer of Care (Signed)
Immediate Anesthesia Transfer of Care Note  Patient: [name]  Procedure(s) Performed: CARDIOVERSION (N/A )  Patient Location: Endoscopy Unit  Anesthesia Type:General  Level of Consciousness: awake, alert  and oriented  Airway & Oxygen Therapy: Patient Spontanous Breathing  Post-op Assessment: Report given to RN, Post -op Vital signs reviewed and stable and Patient moving all extremities X 4  Post vital signs: Reviewed and stable  Last Vitals:  Vitals Value Taken Time  BP 123/84 07/02/2018 11:51 AM  Temp    Pulse 115 07/02/2018 11:51 AM  Resp    SpO2      Last Pain:  Vitals:   07/02/18 1104  TempSrc: Oral  PainSc: 0-No pain         Complications: No apparent anesthesia complications

## 2018-07-02 NOTE — Telephone Encounter (Signed)
Late entry:  Alert from today reviewed for exhausted VT therapies on 8/26 and 8/27. Presenting rhythm: AFL/RVR - persistent since 06/30/18. Last VT episode was on 07/01/18 @ 0657. Stable lead measurements. Normal device function.  Called patient regarding sx's. Patient states that he could tell something was different yesterday. Patient states that he's on his way to his VAD appt. Will have [facility]T representative present for the appt. Patient verbalized understanding.

## 2018-07-02 NOTE — Progress Notes (Signed)
Remote ICD transmission.   

## 2018-07-02 NOTE — Discharge Instructions (Signed)
Electrical Cardioversion, Care After °This sheet gives you information about how to care for yourself after your procedure. Your health care provider may also give you more specific instructions. If you have problems or questions, contact your health care provider. °What can I expect after the procedure? °After the procedure, it is common to have: °· Some redness on the skin where the shocks were given. ° °Follow these instructions at home: °· Do not drive for 24 hours if you were given a medicine to help you relax (sedative). °· Take over-the-counter and prescription medicines only as told by your health care provider. °· Ask your health care provider how to check your pulse. Check it often. °· Rest for 48 hours after the procedure or as told by your health care provider. °· Avoid or limit your caffeine use as told by your health care provider. °Contact a health care provider if: °· You feel like your heart is beating too quickly or your pulse is not regular. °· You have a serious muscle cramp that does not go away. °Get help right away if: °· You have discomfort in your chest. °· You are dizzy or you feel faint. °· You have trouble breathing or you are short of breath. °· Your speech is slurred. °· You have trouble moving an arm or leg on one side of your body. °· Your fingers or toes turn cold or blue. °This information is not intended to replace advice given to you by your health care provider. Make sure you discuss any questions you have with your health care provider. °Document Released: 08/12/2013 Document Revised: 05/25/2016 Document Reviewed: 04/27/2016 °Elsevier Interactive Patient Education © 2018 Elsevier Inc. ° °

## 2018-07-02 NOTE — Anesthesia Procedure Notes (Signed)
Procedure Name: General with mask airway Date/Time: 07/02/2018 11:52 AM Performed by: Mariea Clonts, CRNA Pre-anesthesia Checklist: Patient identified, Emergency Drugs available, Suction available, Patient being monitored and Timeout performed Patient Re-evaluated:Patient Re-evaluated prior to induction Oxygen Delivery Method: Ambu bag Preoxygenation: Pre-oxygenation with 100% oxygen Induction Type: IV induction Ventilation: Mask ventilation without difficulty

## 2018-07-02 NOTE — Anesthesia Preprocedure Evaluation (Signed)
Anesthesia Evaluation  Patient identified by MRN, date of birth, ID band Patient awake    Reviewed: Allergy & Precautions, NPO status , Patient's Chart, lab work & pertinent test results  History of Anesthesia Complications Negative for: history of anesthetic complications  Airway Mallampati: II  TM Distance: >3 FB Neck ROM: Full    Dental  (+) Edentulous Upper, Edentulous Lower   Pulmonary sleep apnea , former smoker,    breath sounds clear to auscultation       Cardiovascular hypertension, Pt. on medications + CAD, + Past MI, + Peripheral Vascular Disease and +CHF  + dysrhythmias Atrial Fibrillation + Cardiac Defibrillator  Rhythm:Irregular  '19 RHC - Biventricular failure despite VAD support  '19 TTE - LV cavity size was mildly dilated. LVAD present.  Aortic root is mildly dilated at 41 mm. LA was severely dilated. RV cavity size was mildly dilated. Systolic function was moderately to severely reduced. RA was mildly dilated.  Difficult to auscultate due to whirring sound from LVAD   Neuro/Psych TIAnegative psych ROS   GI/Hepatic negative GI ROS, Neg liver ROS,   Endo/Other  Hypothyroidism   Renal/GU   negative genitourinary   Musculoskeletal  (+) Arthritis ,   Abdominal   Peds  Hematology  (+) anemia ,   Anesthesia Other Findings   Reproductive/Obstetrics                             Anesthesia Physical  Anesthesia Plan  ASA: III  Anesthesia Plan: General   Post-op Pain Management:    Induction: Intravenous  PONV Risk Score and Plan: 2 and Propofol infusion and Treatment may vary due to age or medical condition  Airway Management Planned: Natural Airway and Mask  Additional Equipment: None  Intra-op Plan:   Post-operative Plan:   Informed Consent: I have reviewed the patients History and Physical, chart, labs and discussed the procedure including the risks, benefits and  alternatives for the proposed anesthesia with the patient or authorized representative who has indicated his/her understanding and acceptance.     Plan Discussed with: CRNA and Anesthesiologist  Anesthesia Plan Comments:         Anesthesia Quick Evaluation

## 2018-07-02 NOTE — Progress Notes (Signed)
Pt taken to Endo for Cardioversion per DR. Bensimhon. Tomi Bamberger from [place] unable to pace pt out of a flutter. Pt has been NPO today.  BP: 123/84 (88) HR: 115 a flutter    Plan: Increase Amio to 200 mg BID. Follow up will be made post cardioversion.  Tanda Rockers RN, BSN VAD Coordinator 24/7 Pager [phone]

## 2018-07-02 NOTE — Interval H&P Note (Signed)
History and Physical Interval Note:  07/02/2018 11:56 AM  [name]  has presented today for surgery, with the diagnosis of a flutter  The various methods of treatment have been discussed with the patient and family. After consideration of risks, benefits and other options for treatment, the patient has consented to  Procedure(s): CARDIOVERSION (N/A) as a surgical intervention .  The patient's history has been reviewed, patient examined, no change in status, stable for surgery.  I have reviewed the patient's chart and labs.  Questions were answered to the patient's satisfaction.     [name]

## 2018-07-02 NOTE — Progress Notes (Signed)
Patient ID: [name], male   DOB: 03-30-1941, 77 y.o.   MRN: [id number]   VAD CLINIC PROGRESS NOTE  PCP: Dr. Kandice [name] ([place]) GU: Dr [name] Neuro Surgeon: Dr [name] Vascular: Dr [name] Primary HF: Dr [name] [name] is a 77 y.o. male  with h/o VT, PAD and severe systolic HF (EF 54-62%) due to mixed ischemic/nonischemic CM he is s/p HM II LVAD implant for destination therapy on October 19, 2013.VAD implantation was complicated by VT, syncope, AF/Aflutter and RHF. Required short term milrinone.   GU Event 09/01/14 had impacted ureteral stone and underwent cystoscopy and flexible ureteroscopy with lithotripsy and basket extraction.  09/2015 Hematuria and chronic nephrolithiasis. Renal US showed mild hyronephrosis and bilateral renal cysts. Dr. Risa [name] took back for repeat lithotripsy and stone extraction by ureteroscopy and J stent placement. 12/18 Recurrent hematuria. 62mm left ureteral stone. Passed with tamulosin 1/19   Admitted with recurrent hematuria with ureteral stone. Stone removed but found to have large bladder mass which was removed. Course c/b obstructive uropathy and AKI requiring 1 session of HD.  3/19 Bladder chemo stopped after 2 treatments as unable to tolerate 7/12 s/p TURB with right ureteral stent placement.   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  4/2-18 - Episode of melena. Hgb 10.9-> 8.7. EGD/colonoscopy negative 12/18 - Melena with syncope  Hgb 6.5. Transfused 5u. 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  01/2016 R CEA. He was started on Plavix.  01/2016 Lumbar fusion L3-L5 with pedicle screws posterolateral arthrodesis with BMP, allograft  VT events 9/17 with high fevers and productive cough. Treated with levaquin x 7 days for acute bronchitis. Also found to have VT and underwent DC-CV. Seen by EP and amio increased 10/17 speed turned down to 8800 due to RV  failure. 10/18 with slow VT and ADHF.Cardioverted in ED and amio uptitrated.  04/01/18 recurrent VT. Underwent DC-CV in Er  Significant VAD Events  03/2017>percutaneous lead repair   Follow up LVAD Clinic: He returns today for regular follow up. Had been doing well overall, but woke up this am feeling tired and weak.  Chronic DOE that is unchanged. Denies orthopnea or PND. No fevers, chills No VAD alarms. Denies bleeding. No melena or neuro symptoms.Taking all meds as prescribed.  Medtronic rep interrogated in clinic by rep. Shows to have been in aflutter for past 26 hrs, with underlying rate of 200 (atrial) and controlled ventricular rate from 95-105.  Medtronic: Thoracic impedence above treshold. Optivol below index. Atrial arryhtmia since midday yesterday. Pt activity < 30 minutes daily.    VAD Indication: Destination Therapy- age excluding    VAD interrogation & Equipment Management: Speed: 8800 Flow: 32.9 Power: 4.0 w PI: 5.5  Alarms: None Events: Rare PI event  Fixed speed 8800 Low speed limit: 8200  Primary Controller: Replace back up battery in 29 months Back up controller: Replace back up battery in 52months   Exit Site Care: Drive line is being maintained weekly by [name]. Drive line exit site well healed and incorporated. The velour is fully implanted at exit site. Dressing dry and intact. No erythema or drainage. Stabilization device present and accurately applied. Pt denies fever or chills. Pt given 6 weekly dressing kits at this visit.   I reviewed the LVAD parameters from today, and compared the results to the patient's prior recorded data.  No  programming changes were made.  The LVAD is functioning within specified parameters.  The patient performs LVAD self-test daily.  LVAD interrogation was negative for any significant power changes, alarms or PI events/speed drops.  LVAD equipment check completed and is in good working order.  Back-up equipment  present.   LVAD education done on emergency procedures and precautions and reviewed exit site care.    Past Medical History:  Diagnosis Date  . Arthritis   . Automatic implantable cardioverter-defibrillator in situ    a. s/p Medtronic Evera device serial [id number]    . Bradycardia   . CAD (coronary artery disease)    a. s/p MI 1982;  s/p DES to CFX 2011;  s/p NSTEMI 4/12 in setting of VTach;  cath 7/12:  LM ok, LAD mid 30-40%, Dx's with 40%, mCFX stent patent, prox to mid RCA occluded with R to R and L to R collats.  Medical Tx was continued  . Chronic systolic heart failure ([place])    a. s/p LVAD 10/2013 for destination therapy  . CKD (chronic kidney disease)   . CVD (cerebrovascular disease)    a. s/p L CEA 2008  . Cytopenia   . Dysrhythmia    AFIB  . History of GI bleed    a. on ASA- started on plavix during 10/2015 admission for CVA  . HLD (hyperlipidemia)   . HTN (hypertension)   . Hypothyroidism   . Inappropriate therapy from implantable cardioverter-defibrillator    a. ATP for sinus tach SVT wavelet identified SVT but was no  passive (2014)  . Ischemic cardiomyopathy   . 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 L Ear   . Myocardial infarction ([place])    1992  . PAF (paroxysmal atrial fibrillation) ([place])   . PVD (peripheral vascular disease) ([place])    a. 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 Shortness of breath dyspnea    W/ EXERTION   . Sleep apnea    NO CPAP NOW  HE SENT IT BACK YRS AGO  . Stroke [facility])    a. 10/2015 admission   . Ventricular tachycardia ([place])    a. s/p AICD;   h/o ICD shock;  Amiodarone Rx. S/P ablation in 2012    No current facility-administered medications for this encounter.    No current outpatient medications on file.   Facility-Administered Medications Ordered in Other Encounters  Medication Dose Route Frequency Provider Last Rate Last Dose  . 0.9 %  sodium  chloride infusion   Intravenous Continuous Huan [name], Frank Pascal, MD 10 mL/hr at 07/02/18 1111      Demerol [meperidine]  Review of systems complete and found to be negative unless listed in HPI.    Vitals:   07/02/18 1151  BP: 123/84  Pulse: (!) 115    Vital Signs:  Doppler Pressure: 94 Automatc BP:  104/86 (90) HR: 120-130 SPO2: 100% RA  Weight: 170 lb w/o eqt Last weight: 175   Physical exam: General:  NAD.  HEENT: normal  Neck: supple. JVP 8-9 Carotids 2+ bilat; no bruits. No lymphadenopathy or thryomegaly appreciated. Cor: LVAD hum.  Lungs: Clear. Abdomen:  soft, nontender, non-distended. No hepatosplenomegaly. No bruits or masses. Good bowel sounds. Driveline site clean. Anchor in place.  Extremities: no cyanosis, clubbing, rash. Warm no edema  Neuro: alert & oriented x 3. No  focal deficits. Moves all 4 without problem    ASSESSMENT AND PLAN:  1. Chronic systolic HF: s/p HM-II LVAD 10/2014.  - Recent RHC in 1/19 with mildly low output. Started on milrinone without benefit.  - Slightly worse NYHA III symptoms today due to recurrent AFL - Volume status stable on exam.   - VAD interrogated personally. Parameters stable. (LVAD speed reduced to 8800 in 10/17 due to RV failure) - s/p previous driveline repair. No pump stops 2. Atrial arrhythmias: Atrial fibrillation, atrial tachycardia.  S/p DC-CV 06/04/14, DC-CV 03/02/15 and 01/19/16.  - Recurrent aflutter today with RVR. Failed attempt to pace out in clinic  - Plan DC-CV later today in ENDO. INR 2.07 - Increase amio to 200 bid  3.VT  - underwent DC-CV 9/17.  - repeat episode of slow VT 08/17/17. Requiring DC-CV.  - repeat VT 04/01/18 requiring DC-CV. amio increased to 200 bid  - Increase amio back to 200 bid - Keep K > 4.0 Mg > 2.0 - ICD interrogated today personally. No further VT episodes. Has recurrent AFl. Failed pace termination.  4. 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  Required HD x1 - Creatinine 1.42.  5. 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 Continue coumadin and plavix.(with h/o CVA). - Admitted 4/18 with recurrent GIB. Transfused 2u. EGD/colon negative - Admitted 12./18. hgb 6.5, Transfused 5u. EGD with 2 AVMs treated with APC - Denies bleeding.  - Hgb 11.5 today - Has been getting octreotide infusions but too expensive for him. Will cotninue to  hold for now  6. 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  - Had 2 BCG infusions with Dr. Gloriann [name] but discontinued due to inability to tolerate - 7/12 s/p TURB with right ureteral stent placement.  7. HTN:  - MAPs stable.  8. LVAD:  - VAD interrogated personally. Parameters stable. - LDH 235 9. Anticoagulation management:  - Goal INR 2-2.5 INR 2.07. Discussed dosing with PharmD personally..  10. Lumbar compression fracture:  - s/p L3-L5 fusion 02/03/16. - Stable.  11. CVA: -  12/16 with left-sided weakness, now resolved.  Continue atorvastatin. (also warfarin INR goal 2-2.5). S/p R CEA 01/13/16. Plavix stopped 12/18 due to recurrent GIB 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 Forde Dandy in past.  - Amio increased due to recurrent atrial arrhythmias and VT.  - Recent FT4 elevated. Synthroid cut to 150 daily in 12/18.  - WIll need TSH/T3/T4 rechecked at next visit   13. Insomnia - OK to continue temazepam 30 qhs PRN  Total time spent 35 minutes. Over half that time spent discussing above.    Glori Bickers, MD  12:02 PM

## 2018-07-02 NOTE — CV Procedure (Signed)
      DIRECT CURRENT CARDIOVERSION  NAME:  [name]   MRN: [id number] DOB:  23-Feb-1941   ADMIT DATE: 07/02/2018   INDICATIONS: Atrial flutter   PROCEDURE:   Informed consent was obtained prior to the procedure. The risks, benefits and alternatives for the procedure were discussed and the patient comprehended these risks. Once an appropriate time out was taken, the patient had the defibrillator pads placed in the anterior and posterior position. The patient then underwent sedation by the anesthesia service. Once an appropriate level of sedation was achieved, the patient received a single biphasic, synchronized 120J shock with prompt conversion to sinus rhythm. No apparent complications. Rhythm confirmed by device interrogation.    Glori Bickers, MD  12:00 PM

## 2018-07-03 ENCOUNTER — Encounter (HOSPITAL_COMMUNITY): Payer: Self-pay | Admitting: Internal Medicine

## 2018-07-03 NOTE — Anesthesia Postprocedure Evaluation (Signed)
Anesthesia Post Note  Patient: [name]  Procedure(s) Performed: CARDIOVERSION (N/A )     Patient location during evaluation: PACU Anesthesia Type: General Level of consciousness: awake and alert Pain management: pain level controlled Vital Signs Assessment: post-procedure vital signs reviewed and stable Respiratory status: spontaneous breathing, nonlabored ventilation, respiratory function stable and patient connected to nasal cannula oxygen Cardiovascular status: blood pressure returned to baseline and stable Postop Assessment: no apparent nausea or vomiting Anesthetic complications: no    Last Vitals:  Vitals:   07/02/18 1211 07/02/18 1220  BP: 102/88 94/73  Pulse: 79 79  Resp: 14 18  Temp: 36.6 C   SpO2: 97% 98%    Last Pain:  Vitals:   07/03/18 1547  TempSrc:   PainSc: 0-No pain   Pain Goal:                 [name]

## 2018-07-08 ENCOUNTER — Ambulatory Visit (HOSPITAL_COMMUNITY): Payer: Self-pay | Admitting: Pharmacist

## 2018-07-08 LAB — POCT INR: INR: 1.9 — AB (ref 2.0–3.0)

## 2018-07-09 ENCOUNTER — Inpatient Hospital Stay (HOSPITAL_COMMUNITY)
Admission: EM | Admit: 2018-07-09 | Discharge: 2018-07-14 | DRG: 291 | Disposition: A | Discharge status: Home / Self Care | Payer: Medicare Other | Attending: Internal Medicine | Admitting: Internal Medicine

## 2018-07-09 ENCOUNTER — Other Ambulatory Visit: Payer: Self-pay

## 2018-07-09 ENCOUNTER — Encounter (HOSPITAL_COMMUNITY): Payer: Self-pay | Admitting: Emergency Medicine

## 2018-07-09 ENCOUNTER — Telehealth: Payer: Self-pay | Admitting: *Deleted

## 2018-07-09 DIAGNOSIS — Z87442 Personal history of urinary calculi: Secondary | ICD-10-CM

## 2018-07-09 DIAGNOSIS — Z955 Presence of coronary angioplasty implant and graft: Secondary | ICD-10-CM

## 2018-07-09 DIAGNOSIS — Z961 Presence of intraocular lens: Secondary | ICD-10-CM | POA: Diagnosis present

## 2018-07-09 DIAGNOSIS — Z8249 Family history of ischemic heart disease and other diseases of the circulatory system: Secondary | ICD-10-CM

## 2018-07-09 DIAGNOSIS — I2510 Atherosclerotic heart disease of native coronary artery without angina pectoris: Secondary | ICD-10-CM | POA: Diagnosis present

## 2018-07-09 DIAGNOSIS — Z95810 Presence of automatic (implantable) cardiac defibrillator: Secondary | ICD-10-CM | POA: Diagnosis not present

## 2018-07-09 DIAGNOSIS — E43 Unspecified severe protein-calorie malnutrition: Secondary | ICD-10-CM

## 2018-07-09 DIAGNOSIS — G4700 Insomnia, unspecified: Secondary | ICD-10-CM | POA: Diagnosis present

## 2018-07-09 DIAGNOSIS — Z79899 Other long term (current) drug therapy: Secondary | ICD-10-CM | POA: Diagnosis not present

## 2018-07-09 DIAGNOSIS — I472 Ventricular tachycardia, unspecified: Secondary | ICD-10-CM

## 2018-07-09 DIAGNOSIS — I255 Ischemic cardiomyopathy: Secondary | ICD-10-CM | POA: Diagnosis present

## 2018-07-09 DIAGNOSIS — N186 End stage renal disease: Secondary | ICD-10-CM | POA: Diagnosis present

## 2018-07-09 DIAGNOSIS — I4892 Unspecified atrial flutter: Secondary | ICD-10-CM | POA: Diagnosis present

## 2018-07-09 DIAGNOSIS — Z7901 Long term (current) use of anticoagulants: Secondary | ICD-10-CM | POA: Diagnosis not present

## 2018-07-09 DIAGNOSIS — E785 Hyperlipidemia, unspecified: Secondary | ICD-10-CM | POA: Diagnosis present

## 2018-07-09 DIAGNOSIS — E039 Hypothyroidism, unspecified: Secondary | ICD-10-CM | POA: Diagnosis present

## 2018-07-09 DIAGNOSIS — I132 Hypertensive heart and chronic kidney disease with heart failure and with stage 5 chronic kidney disease, or end stage renal disease: Principal | ICD-10-CM | POA: Diagnosis present

## 2018-07-09 DIAGNOSIS — I739 Peripheral vascular disease, unspecified: Secondary | ICD-10-CM | POA: Diagnosis present

## 2018-07-09 DIAGNOSIS — Z992 Dependence on renal dialysis: Secondary | ICD-10-CM

## 2018-07-09 DIAGNOSIS — G4730 Sleep apnea, unspecified: Secondary | ICD-10-CM | POA: Diagnosis present

## 2018-07-09 DIAGNOSIS — I959 Hypotension, unspecified: Secondary | ICD-10-CM | POA: Diagnosis present

## 2018-07-09 DIAGNOSIS — I480 Paroxysmal atrial fibrillation: Secondary | ICD-10-CM | POA: Diagnosis present

## 2018-07-09 DIAGNOSIS — Z95811 Presence of heart assist device: Secondary | ICD-10-CM | POA: Diagnosis not present

## 2018-07-09 DIAGNOSIS — I252 Old myocardial infarction: Secondary | ICD-10-CM

## 2018-07-09 DIAGNOSIS — I5022 Chronic systolic (congestive) heart failure: Secondary | ICD-10-CM

## 2018-07-09 DIAGNOSIS — Z79890 Hormone replacement therapy (postmenopausal): Secondary | ICD-10-CM

## 2018-07-09 DIAGNOSIS — Z9841 Cataract extraction status, right eye: Secondary | ICD-10-CM

## 2018-07-09 DIAGNOSIS — Z9842 Cataract extraction status, left eye: Secondary | ICD-10-CM | POA: Diagnosis not present

## 2018-07-09 DIAGNOSIS — Z87891 Personal history of nicotine dependence: Secondary | ICD-10-CM

## 2018-07-09 DIAGNOSIS — Z8673 Personal history of transient ischemic attack (TIA), and cerebral infarction without residual deficits: Secondary | ICD-10-CM

## 2018-07-09 LAB — COMPREHENSIVE METABOLIC PANEL
ALBUMIN: 3.7 g/dL (ref 3.5–5.0)
ALT: 41 U/L (ref 0–44)
ANION GAP: 14 (ref 5–15)
AST: 61 U/L — AB (ref 15–41)
Alkaline Phosphatase: 190 U/L — ABNORMAL HIGH (ref 38–126)
BILIRUBIN TOTAL: 0.7 mg/dL (ref 0.3–1.2)
BUN: 43 mg/dL — AB (ref 8–23)
CHLORIDE: 101 mmol/L (ref 98–111)
CO2: 21 mmol/L — ABNORMAL LOW (ref 22–32)
Calcium: 9.5 mg/dL (ref 8.9–10.3)
Creatinine, Ser: 1.52 mg/dL — ABNORMAL HIGH (ref 0.61–1.24)
GFR calc Af Amer: 49 mL/min — ABNORMAL LOW (ref >60)
GFR, EST NON AFRICAN AMERICAN: 42 mL/min — AB (ref >60)
Glucose, Bld: 105 mg/dL — ABNORMAL HIGH (ref 70–99)
POTASSIUM: 4.3 mmol/L (ref 3.5–5.1)
Sodium: 136 mmol/L (ref 135–145)
Total Protein: 8 g/dL (ref 6.5–8.1)

## 2018-07-09 LAB — CBC WITH DIFFERENTIAL/PLATELET
Basophils Absolute: 0.1 10*3/uL (ref 0.0–0.1)
Basophils Relative: 1 %
EOS PCT: 4 %
Eosinophils Absolute: 0.2 10*3/uL (ref 0.0–0.7)
HEMATOCRIT: 36.3 % — AB (ref 39.0–52.0)
HEMOGLOBIN: 11.4 g/dL — AB (ref 13.0–17.0)
LYMPHS ABS: 0.9 10*3/uL (ref 0.7–4.0)
LYMPHS PCT: 16 %
MCH: 29.4 pg (ref 26.0–34.0)
MCHC: 31.4 g/dL (ref 30.0–36.0)
MCV: 93.6 fL (ref 78.0–100.0)
MONOS PCT: 14 %
Monocytes Absolute: 0.8 10*3/uL (ref 0.1–1.0)
Neutro Abs: 3.6 10*3/uL (ref 1.7–7.7)
Neutrophils Relative %: 65 %
Platelets: 172 10*3/uL (ref 150–400)
RBC: 3.88 MIL/uL — ABNORMAL LOW (ref 4.22–5.81)
RDW: 22.5 % — ABNORMAL HIGH (ref 11.5–15.5)
WBC: 5.6 10*3/uL (ref 4.0–10.5)

## 2018-07-09 LAB — MRSA PCR SCREENING: MRSA BY PCR: NEGATIVE

## 2018-07-09 LAB — MAGNESIUM: Magnesium: 2.2 mg/dL (ref 1.7–2.4)

## 2018-07-09 MED ORDER — PROPOFOL 10 MG/ML IV BOLUS
INTRAVENOUS | Status: AC
  Filled 2018-07-09: qty 20

## 2018-07-09 MED ORDER — TEMAZEPAM 15 MG PO CAPS: 30.0000 mg | ORAL_CAPSULE | Freq: Every evening | ORAL | Status: DC | PRN

## 2018-07-09 MED ORDER — SILDENAFIL CITRATE 20 MG PO TABS
20.0000 mg | ORAL_TABLET | Freq: Three times a day (TID) | ORAL | Status: DC
  Administered 2018-07-09 – 2018-07-14 (×13): 20 mg via ORAL
  Filled 2018-07-09 (×14): qty 1

## 2018-07-09 MED ORDER — WARFARIN - PHARMACIST DOSING INPATIENT
Freq: Every day | Status: DC
  Administered 2018-07-10 – 2018-07-11 (×2)

## 2018-07-09 MED ORDER — PHENYLEPHRINE 40 MCG/ML (10ML) SYRINGE FOR IV PUSH (FOR BLOOD PRESSURE SUPPORT)
PREFILLED_SYRINGE | INTRAVENOUS | Status: AC
  Filled 2018-07-09: qty 10

## 2018-07-09 MED ORDER — RANOLAZINE ER 500 MG PO TB12
500.0000 mg | ORAL_TABLET | Freq: Two times a day (BID) | ORAL | Status: DC
  Administered 2018-07-09 – 2018-07-12 (×6): 500 mg via ORAL
  Filled 2018-07-09 (×6): qty 1

## 2018-07-09 MED ORDER — LEVOTHYROXINE SODIUM 150 MCG PO TABS
150.0000 ug | ORAL_TABLET | Freq: Every day | ORAL | Status: DC
  Administered 2018-07-10 – 2018-07-14 (×5): 150 ug via ORAL
  Filled 2018-07-09 (×3): qty 1
  Filled 2018-07-09 (×2): qty 2

## 2018-07-09 MED ORDER — ENSURE ENLIVE PO LIQD: 237.0000 mL | Freq: Two times a day (BID) | ORAL | Status: DC

## 2018-07-09 MED ORDER — ATORVASTATIN CALCIUM 80 MG PO TABS
80.0000 mg | ORAL_TABLET | Freq: Every day | ORAL | Status: DC
  Administered 2018-07-10 – 2018-07-13 (×4): 80 mg via ORAL
  Filled 2018-07-09 (×4): qty 1

## 2018-07-09 MED ORDER — PANTOPRAZOLE SODIUM 40 MG PO TBEC
40.0000 mg | DELAYED_RELEASE_TABLET | Freq: Two times a day (BID) | ORAL | Status: DC
  Administered 2018-07-10 – 2018-07-14 (×9): 40 mg via ORAL
  Filled 2018-07-09 (×9): qty 1

## 2018-07-09 MED ORDER — PROMETHAZINE HCL 25 MG PO TABS: 12.5000 mg | ORAL_TABLET | Freq: Four times a day (QID) | ORAL | Status: DC | PRN

## 2018-07-09 MED ORDER — FUROSEMIDE 20 MG PO TABS
20.0000 mg | ORAL_TABLET | Freq: Every day | ORAL | Status: DC | PRN
  Administered 2018-07-12: 20 mg via ORAL
  Filled 2018-07-09: qty 1

## 2018-07-09 MED ORDER — AMIODARONE HCL IN DEXTROSE 360-4.14 MG/200ML-% IV SOLN
30.0000 mg/h | INTRAVENOUS | Status: DC
  Administered 2018-07-10 – 2018-07-11 (×2): 30 mg/h via INTRAVENOUS
  Filled 2018-07-09 (×3): qty 200

## 2018-07-09 MED ORDER — PROPOFOL 10 MG/ML IV BOLUS
INTRAVENOUS | Status: AC | PRN
  Administered 2018-07-09: 60 mg via INTRAVENOUS

## 2018-07-09 MED ORDER — AMIODARONE LOAD VIA INFUSION
150.0000 mg | Freq: Once | INTRAVENOUS | Status: AC
  Administered 2018-07-09: 150 mg via INTRAVENOUS
  Filled 2018-07-09: qty 83.34

## 2018-07-09 MED ORDER — AMIODARONE HCL IN DEXTROSE 360-4.14 MG/200ML-% IV SOLN
60.0000 mg/h | INTRAVENOUS | Status: AC
  Administered 2018-07-09 (×2): 60 mg/h via INTRAVENOUS
  Filled 2018-07-09 (×2): qty 200

## 2018-07-09 MED ORDER — WARFARIN SODIUM 2 MG PO TABS
2.0000 mg | ORAL_TABLET | Freq: Once | ORAL | Status: AC
  Administered 2018-07-09: 2 mg via ORAL
  Filled 2018-07-09: qty 1

## 2018-07-09 MED ORDER — SODIUM CHLORIDE 0.9 % IV SOLN
INTRAVENOUS | Status: DC | PRN
  Administered 2018-07-09: 10 mL/h via INTRAVENOUS
  Administered 2018-07-12: 250 mL via INTRAVENOUS

## 2018-07-09 MED ORDER — ACETAMINOPHEN 325 MG PO TABS: 650.0000 mg | ORAL_TABLET | ORAL | Status: DC | PRN

## 2018-07-09 MED ORDER — ONDANSETRON HCL 4 MG/2ML IJ SOLN
4.0000 mg | Freq: Four times a day (QID) | INTRAMUSCULAR | Status: DC | PRN
  Administered 2018-07-10: 4 mg via INTRAVENOUS
  Filled 2018-07-09: qty 2

## 2018-07-09 MED ORDER — PHENYLEPHRINE HCL 10 MG/ML IJ SOLN
INTRAMUSCULAR | Status: AC | PRN
  Administered 2018-07-09: 40 ug

## 2018-07-09 MED ORDER — DOCUSATE SODIUM 100 MG PO CAPS
200.0000 mg | ORAL_CAPSULE | Freq: Every day | ORAL | Status: DC
  Administered 2018-07-09 – 2018-07-14 (×6): 200 mg via ORAL
  Filled 2018-07-09 (×6): qty 2

## 2018-07-09 NOTE — Plan of Care (Signed)
Patient currently in NSR, with occasional PVCs.  Denies any pain of discomfort.  Monitoring as per protocol.

## 2018-07-09 NOTE — ED Triage Notes (Signed)
Pt arrives PVO, LVAD low flow alarming, pt in [place]. A&O x 4.

## 2018-07-09 NOTE — Telephone Encounter (Signed)
Dr Haroldine Laws and VAD coordinators are aware, pt should be at ER around 7:00 tonight, they will address and EP is aware to consult on pt tomorrow.

## 2018-07-09 NOTE — Progress Notes (Signed)
ANTICOAGULATION CONSULT NOTE - Initial Consult  Pharmacy Consult for warfarin Indication: atrial fibrillation, LVAD  Allergies  Allergen Reactions  . Demerol [Meperidine] Other (See Comments)    Paralysis/ Could only move eyes    Patient Measurements:   Vital Signs: BP: 115/99 (09/04 1810) Pulse Rate: 77 (09/04 1810)  Labs: Recent Labs    07/08/18  INR 1.9*    Estimated Creatinine Clearance: 46.8 mL/min (A) (by C-G formula based on SCr of 1.42 mg/dL (H)).   Medical History: Past Medical History:  Diagnosis Date  . Arthritis   . Automatic implantable cardioverter-defibrillator in situ    a. s/p Medtronic Evera device serial [id number]    . Bradycardia   . CAD (coronary artery disease)    a. s/p MI 1982;  s/p DES to CFX 2011;  s/p NSTEMI 4/12 in setting of VTach;  cath 7/12:  LM ok, LAD mid 30-40%, Dx's with 40%, mCFX stent patent, prox to mid RCA occluded with R to R and L to R collats.  Medical Tx was continued  . Chronic systolic heart failure ([place])    a. s/p LVAD 10/2013 for destination therapy  . CKD (chronic kidney disease)   . CVD (cerebrovascular disease)    a. s/p L CEA 2008  . Cytopenia   . Dysrhythmia    AFIB  . History of GI bleed    a. on ASA- started on plavix during 10/2015 admission for CVA  . HLD (hyperlipidemia)   . HTN (hypertension)   . Hypothyroidism   . Inappropriate therapy from implantable cardioverter-defibrillator    a. ATP for sinus tach SVT wavelet identified SVT but was no  passive (2014)  . Ischemic cardiomyopathy   . 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 L Ear   . Myocardial infarction ([place])    1992  . PAF (paroxysmal atrial fibrillation) ([place])   . PVD (peripheral vascular disease) ([place])    a. 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 Shortness of breath dyspnea    W/ EXERTION   . Sleep apnea    NO CPAP NOW  HE SENT IT BACK YRS AGO  . Stroke  [facility])    a. 10/2015 admission   . Ventricular tachycardia ([place])    a. s/p AICD;   h/o ICD shock;  Amiodarone Rx. S/P ablation in 2012   Assessment: 77 year old male with LVAD well known to pharmacy. Patient found to be having low flow alarms and VT transmitted on ICD, instructed by MD to come to ED for evaluation. In ED patient was still in VT and was cardioverted.   Patient on warfarin for his afib and LVAD. INR yesterday was 1.9, and he was instructed to take an extra dose of warfarin. INR not drawn this evening, will check in am with other labs. Patient has not taken his warfarin yet so will give it now.  Home warfarin regimen is 2mg  daily except 4mg  on Tuesdays  Goal of Therapy:  INR goal 2-2.5 Monitor platelets by anticoagulation protocol: Yes   Plan:  Warfarin 2mg  tonight Daily INR for now  Erin Hearing PharmD., BCPS Clinical Pharmacist 07/09/2018 6:28 PM

## 2018-07-09 NOTE — Telephone Encounter (Signed)
Manual transmission sent bc patient states that he feels as if he is back in AF/AFL.   Remote transmission reviewed. Presenting rhythm: VT @ 167bpm - persistent since 07/09/18 @ 0914.   Informed wife that patient is currently in VT and will need to present to the ER for a cardioversion. Wife verbalized understanding and states that she will get him there within the next couple of hours.  Will forward to Dr.Klein, Dr.DB, and Kevan Rosebush, RN

## 2018-07-09 NOTE — ED Notes (Signed)
Report given to receiving RN by Dr. Haroldine Laws.

## 2018-07-09 NOTE — ED Provider Notes (Signed)
[facility] EMERGENCY DEPARTMENT Provider Note   CSN: [id number] Arrival date & time: 07/09/18  1731     History   Chief Complaint Chief Complaint  Patient presents with  . Tachycardia    HPI [name] is a 77 y.o. male.  HPI Patient has LVAD device with history of dysrhythmias including ventricular tachycardia and multiple medical comorbidities.  Patient began experiencing low flow alarm and identified ventricular tachycardia.  He reports he feels very generally weak.  He denies any active chest pain.  He has required cardioversion previously for ventricular tachycardia.  No recent fevers, chills or general illness. Past Medical History:  Diagnosis Date  . Arthritis   . Automatic implantable cardioverter-defibrillator in situ    a. s/p Medtronic Evera device serial [id number]    . Bradycardia   . CAD (coronary artery disease)    a. s/p MI 1982;  s/p DES to CFX 2011;  s/p NSTEMI 4/12 in setting of VTach;  cath 7/12:  LM ok, LAD mid 30-40%, Dx's with 40%, mCFX stent patent, prox to mid RCA occluded with R to R and L to R collats.  Medical Tx was continued  . Chronic systolic heart failure ([place])    a. s/p LVAD 10/2013 for destination therapy  . CKD (chronic kidney disease)   . CVD (cerebrovascular disease)    a. s/p L CEA 2008  . Cytopenia   . Dysrhythmia    AFIB  . History of GI bleed    a. on ASA- started on plavix during 10/2015 admission for CVA  . HLD (hyperlipidemia)   . HTN (hypertension)   . Hypothyroidism   . Inappropriate therapy from implantable cardioverter-defibrillator    a. ATP for sinus tach SVT wavelet identified SVT but was no  passive (2014)  . Ischemic cardiomyopathy   . 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 L Ear   . Myocardial infarction ([place])    1992  . PAF (paroxysmal atrial fibrillation) ([place])   . PVD (peripheral vascular disease) ([place])    a. 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 Shortness of breath dyspnea    W/ EXERTION   . Sleep apnea    NO CPAP NOW  HE SENT IT BACK YRS AGO  . Stroke [facility])    a. 10/2015 admission   . Ventricular tachycardia ([place])    a. s/p AICD;   h/o ICD shock;  Amiodarone Rx. S/P ablation in 2012    Patient Active Problem List   Diagnosis Date Noted  . Malnutrition of moderate degree 05/15/2018  . Complication involving left ventricular assist device (LVAD) 05/14/2018  . Acute on chronic systolic heart failure ([place]) 11/29/2017  . Right ureteral stone   . Melena   . Left ventricular assist device (LVAD) complication, initial encounter 03/26/2017  . Symptomatic anemia 02/20/2017  . Acute bronchitis 07/19/2016  . Open wound of lumbar region without complication 46/27/0350  . Chronic diastolic heart failure ([place])   . Spinal compression fracture ([place]) 02/03/2016  . Subtherapeutic international normalized ratio (INR) 02/01/2016  . Carotid stenosis, asymptomatic 01/31/2016  . Carotid stenosis, symptomatic w/o infarct 01/31/2016  . Carotid artery calcification 01/11/2016  . Symptomatic stenosis of right carotid artery 01/09/2016  . History of GI bleed   . PAF (paroxysmal atrial fibrillation) ([place])   .  CKD (chronic kidney disease)   . Hypothyroidism   . CVA (cerebral infarction)   . Cardiomyopathy, ischemic   . Carotid stenosis   . History of CEA (carotid endarterectomy)   . Chronic anticoagulation   . Left ventricular assist device (LVAD) complication 13/06/6577  . Jerking movements of extremities 07/14/2015  . Fall 06/14/2015  . Cough 06/01/2015  . Nausea & vomiting 02/28/2015  . Near syncope   . Chronic systolic CHF (congestive heart failure) ([place])   . Atrial tachycardia, paroxysmal ([place])   . Back pain at L4-L5 level 02/20/2015  . Compression fracture of L4 lumbar vertebra 02/20/2015  . Acute GI bleeding 02/12/2015  . Dyspnea 09/13/2014  . Nephrolithiasis 08/09/2014  . Loss of R  waves on his ICD 03/30/2014  . CKD (chronic kidney disease) stage 3, GFR 30-59 ml/min (HCC) 03/26/2014  . RVF (right ventricular failure) ([place]) 11/01/2013  . VT (ventricular tachycardia) ([place]) 10/22/2013  . LVAD (left ventricular assist device) present ([place]) 10/20/2013  . Acute on chronic systolic (congestive) heart failure ([place]) 10/19/2013  . Atrial flutter ([place]) 10/15/2013  . Syncope 10/12/2013  . Weakness generalized 09/23/2013  . Other malaise and fatigue 09/23/2013  . Palliative care encounter 09/23/2013  . Fever 09/20/2013  . Sepsis(995.91) 09/20/2013  . Atrial tachycardia ([place]) 08/27/2013  . Chronic systolic heart failure ([place]) 08/27/2013  . Sinus bradycardia 05/11/2013  . Inappropriate therapy from implantable cardioverter-defibrillator 04/02/2013  . History of AAA (abdominal aortic aneurysm) repair 11/25/2012  . CAD (coronary artery disease) 04/30/2012  . Occlusion and stenosis of carotid artery without mention of cerebral infarction 03/25/2012  . PVD (peripheral vascular disease) ([place])   . HTN (hypertension)   . HLD (hyperlipidemia)   . Ischemic cardiomyopathy   . Automatic implantable cardioverter-defibrillator in situ 04/09/2011  . VENTRICULAR TACHYCARDIA 09/07/2009  . RENAL INSUFFICIENCY 09/07/2009    Past Surgical History:  Procedure Laterality Date  . BACK SURGERY    . CARDIAC CATHETERIZATION     "several" (05/25/2013)  . CARDIAC DEFIBRILLATOR PLACEMENT  2003; 2005; 05/25/2013   2014: Medtronic Evera device serial number [id number]  . CARDIAC ELECTROPHYSIOLOGY STUDY AND ABLATION  2012  . CARDIOVERSION N/A 10/15/2013   Procedure: CARDIOVERSION;  Surgeon: Jolaine Artist, MD;  Location: [facility];  Service: Cardiovascular;  Laterality: N/A;  . CARDIOVERSION N/A 11/04/2013   Procedure: CARDIOVERSION;  Surgeon: Larey Dresser, MD;  Location: [place];  Service: Cardiovascular;  Laterality: N/A;  . CARDIOVERSION N/A 06/04/2014   Procedure: CARDIOVERSION;  Surgeon:  Jolaine Artist, MD;  Location: [facility] ENDOSCOPY;  Service: Cardiovascular;  Laterality: N/A;  . CARDIOVERSION N/A 03/02/2015   Procedure: CARDIOVERSION;  Surgeon: Jolaine Artist, MD;  Location: [facility] ENDOSCOPY;  Service: Cardiovascular;  Laterality: N/A;  . CARDIOVERSION N/A 01/19/2016   Procedure: CARDIOVERSION;  Surgeon: Larey Dresser, MD;  Location: [place];  Service: Cardiovascular;  Laterality: N/A;  . CARDIOVERSION N/A 06/10/2018   Procedure: CARDIOVERSION;  Surgeon: Jolaine Artist, MD;  Location: [facility] ENDOSCOPY;  Service: Cardiovascular;  Laterality: N/A;  . CARDIOVERSION N/A 07/02/2018   Procedure: CARDIOVERSION;  Surgeon: Jolaine Artist, MD;  Location: [place];  Service: Cardiovascular;  Laterality: N/A;  . CAROTID ENDARTERECTOMY Left ~ 2007  . CATARACT EXTRACTION W/ INTRAOCULAR LENS  IMPLANT, BILATERAL Bilateral 2000  . CHOLECYSTECTOMY  12/15/?2010  . COLONOSCOPY WITH PROPOFOL N/A 06-23-202018   Procedure: COLONOSCOPY WITH PROPOFOL;  Surgeon: Ronald Lobo, MD;  Location: [place];  Service: Endoscopy;  Laterality: N/A;  . CORONARY  ANGIOPLASTY  1992  . CORONARY ANGIOPLASTY WITH STENT PLACEMENT     "last one was 11/2012  (05/25/2013)  . CYSTOSCOPY W/ RETROGRADES Bilateral 05/16/2018   Procedure: CYSTOSCOPY WITH RETROGRADE PYELOGRAM;  Surgeon: Lucas Mallow, MD;  Location: [place];  Service: Urology;  Laterality: Bilateral;  . CYSTOSCOPY WITH RETROGRADE PYELOGRAM, URETEROSCOPY AND STENT PLACEMENT Left 08/09/2014   Procedure: CYSTOSCOPY WITH RETROGRADE PYELOGRAM, URETEROSCOPY AND STENT PLACEMENT;  Surgeon: Bernestine Amass, MD;  Location: [facility];  Service: Urology;  Laterality: Left;  . CYSTOSCOPY WITH RETROGRADE PYELOGRAM, URETEROSCOPY AND STENT PLACEMENT Left 09/01/2014   Procedure: CYSTOSCOPY WITH URETEROSCOPY AND STENT REMOVAL;  Surgeon: Bernestine Amass, MD;  Location: [facility];  Service: Urology;  Laterality: Left;  . CYSTOSCOPY WITH RETROGRADE PYELOGRAM, URETEROSCOPY  AND STENT PLACEMENT Left 09/20/2014   Procedure: CYSTOSCOPY WITH RETROGRADE PYELOGRAM, URETEROSCOPY AND STENT PLACEMENT, stone removal;  Surgeon: Bernestine Amass, MD;  Location: [facility];  Service: Urology;  Laterality: Left;  . CYSTOSCOPY WITH RETROGRADE PYELOGRAM, URETEROSCOPY AND STENT PLACEMENT Bilateral 12/04/2017   Procedure: CYSTOSCOPY WITH BILATERAL RETROGRADE PYELOGRAM, LEFT URETEROSCOPY AND STENT PLACEMENT;  Surgeon: Lucas Mallow, MD;  Location: [place];  Service: Urology;  Laterality: Bilateral;  . DOPPLER ECHOCARDIOGRAPHY  2011  . ELBOW ARTHROSCOPY Right 1990's  . ELECTROPHYSIOLOGIC STUDY N/A 07/19/2016   Procedure: Cardioversion;  Surgeon: Evans Lance, MD;  Location: [facility] CV LAB;  Service: Cardiovascular;  Laterality: N/A;  . ENDARTERECTOMY Right 01/13/2016   Procedure: RIGHT CAROTID ARTERY ENDARTERECTOMY;  Surgeon: Mal Misty, MD;  Location: [place];  Service: Vascular;  Laterality: Right;  . ENTEROSCOPY Left 02/23/2017   Procedure: ENTEROSCOPY;  Surgeon: Ronald Lobo, MD;  Location: [facility] ENDOSCOPY;  Service: Endoscopy;  Laterality: Left;  . ENTEROSCOPY N/A 10/17/2017   Procedure: ENTEROSCOPY;  Surgeon: Yetta Flock, MD;  Location: [facility] ENDOSCOPY;  Service: Gastroenterology;  Laterality: N/A;  LVAD pt.    . ESOPHAGOGASTRODUODENOSCOPY N/A 02/15/2015   Procedure: ESOPHAGOGASTRODUODENOSCOPY (EGD);  Surgeon: Carol Ada, MD;  Location: [facility] ENDOSCOPY;  Service: Endoscopy;  Laterality: N/A;  LVAD  . EYE SURGERY     VITRECTOMY  LEFT 05/2012  . FEMORAL-POPLITEAL BYPASS GRAFT Right 2011  . FEMORAL-POPLITEAL BYPASS GRAFT Left 05/2002   Archie Endo 05/06/2002 (05/25/2013)  . HEEL SPUR SURGERY Right 1990's  . HOLMIUM LASER APPLICATION Left 14/97/0263   Procedure: HOLMIUM LASER APPLICATION;  Surgeon: Bernestine Amass, MD;  Location[facility];  Service: Urology;  Laterality: Left;  . HOLMIUM LASER APPLICATION Left 7/85/8850   Procedure: HOLMIUM LASER APPLICATION;  Surgeon: Lucas Mallow, MD;  Location: [place];  Service: Urology;  Laterality: Left;  . IMPLANTABLE CARDIOVERTER DEFIBRILLATOR GENERATOR CHANGE N/A 05/25/2013   Procedure: IMPLANTABLE CARDIOVERTER DEFIBRILLATOR GENERATOR CHANGE;  Surgeon: Deboraha Sprang, MD;  Location: [facility] CATH LAB;  Service: Cardiovascular;  Laterality: N/A;  . INSERTION OF IMPLANTABLE LEFT VENTRICULAR ASSIST DEVICE N/A 10/19/2013   Procedure: INSERTION OF IMPLANTABLE LEFT VENTRICULAR ASSIST DEVICE;  Surgeon: Gaye Pollack, MD;  Location: [place];  Service: Open Heart Surgery;  Laterality: N/A;  CIRC ARREST  NITRIC OXIDE  MEDTRONIC ICD  . INTRAOPERATIVE TRANSESOPHAGEAL ECHOCARDIOGRAM N/A 10/19/2013   Procedure: INTRAOPERATIVE TRANSESOPHAGEAL ECHOCARDIOGRAM;  Surgeon: Gaye Pollack, MD;  Location: [facility] OR;  Service: Open Heart Surgery;  Laterality: N/A;  . IR FLUORO GUIDE CV LINE RIGHT  12/10/2017  . IR US GUIDE VASC ACCESS RIGHT  12/10/2017  . KNEE ARTHROSCOPY Bilateral 1990's  . LEAD  REVISION N/A 06/05/2013   Procedure: LEAD REVISION;  Surgeon: Thompson Grayer, MD;  Location: [facility] CATH LAB;  Service: Cardiovascular;  Laterality: N/A;  . LEFT HEART CATHETERIZATION WITH CORONARY ANGIOGRAM N/A 11/24/2012   Procedure: LEFT HEART CATHETERIZATION WITH CORONARY ANGIOGRAM;  Surgeon: Peter M Martinique, MD;  Location: [facility] CATH LAB;  Service: Cardiovascular;  Laterality: N/A;  . LIPOMA EXCISION Left 2013   "near ear" (05/25/2013)  . [place]; 2000's  . PATCH ANGIOPLASTY Right 01/13/2016   Procedure: WITH  PATCH ANGIOPLASTY;  Surgeon: Mal Misty, MD;  Location: [facility];  Service: Vascular;  Laterality: Right;  . RENAL ARTERY BYPASS Bilateral 2003  . REVASCULARIZATION / IN-SITU GRAFT LEG    . RIGHT HEART CATH N/A 11/29/2017   Procedure: RIGHT HEART CATH;  Surgeon: Jolaine Artist, MD;  Location: [place] CV LAB;  Service: Cardiovascular;  Laterality: N/A;  . RIGHT HEART CATHETERIZATION N/A 09/17/2013   Procedure: RIGHT HEART CATH;  Surgeon: Jolaine Artist, MD;  Location: [facility] CATH LAB;  Service: Cardiovascular;  Laterality: N/A;  . RIGHT HEART CATHETERIZATION N/A 10/14/2013   Procedure: RIGHT HEART CATH;  Surgeon: Jolaine Artist, MD;  Location: [facility] CATH LAB;  Service: Cardiovascular;  Laterality: N/A;  . RIGHT HEART CATHETERIZATION N/A 11/16/2013   Procedure: RIGHT HEART CATH;  Surgeon: Jolaine Artist, MD;  Location: [facility] CATH LAB;  Service: Cardiovascular;  Laterality: N/A;  . RIGHT HEART CATHETERIZATION N/A 07/13/2014   Procedure: RIGHT HEART CATH;  Surgeon: Jolaine Artist, MD;  Location: [facility] CATH LAB;  Service: Cardiovascular;  Laterality: N/A;  . TEE WITHOUT CARDIOVERSION N/A 11/04/2013   Procedure: TRANSESOPHAGEAL ECHOCARDIOGRAM (TEE);  Surgeon: Larey Dresser, MD;  Location: [place];  Service: Cardiovascular;  Laterality: N/A;  . TEE WITHOUT CARDIOVERSION N/A 03/02/2015   Procedure: TRANSESOPHAGEAL ECHOCARDIOGRAM (TEE);  Surgeon: Jolaine Artist, MD;  Location: [facility] ENDOSCOPY;  Service: Cardiovascular;  Laterality: N/A;  . TEE WITHOUT CARDIOVERSION N/A 01/19/2016   Procedure: TRANSESOPHAGEAL ECHOCARDIOGRAM (TEE);  Surgeon: Larey Dresser, MD;  Location: [place];  Service: Cardiovascular;  Laterality: N/A;  . TONSILLECTOMY  1946  . TRANSURETHRAL RESECTION OF BLADDER TUMOR N/A 12/04/2017   Procedure: TRANSURETHRAL RESECTION OF BLADDER TUMOR (TURBT);  Surgeon: Lucas Mallow, MD;  Location: [place];  Service: Urology;  Laterality: N/A;  . TRANSURETHRAL RESECTION OF BLADDER TUMOR N/A 05/16/2018   Procedure: TRANSURETHRAL RESECTION OF BLADDER TUMOR (TURBT);  Surgeon: Lucas Mallow, MD;  Location: [place];  Service: Urology;  Laterality: N/A;  . TUMOR EXCISION Left 1960's   "fatty tumor" (05/25/2013)  . V-TACH ABLATION N/A 09/12/2011   Procedure: V-TACH ABLATION;  Surgeon: Evans Lance, MD;  Location: [facility] CATH LAB;  Service: Cardiovascular;  Laterality: N/A;  . V-TACH ABLATION N/A 06/08/2013   Procedure: V-TACH ABLATION;   Surgeon: Evans Lance, MD;  Location: [facility] CATH LAB;  Service: Cardiovascular;  Laterality: N/A;        Home Medications    Prior to Admission medications   Medication Sig Start Date End Date Taking? Authorizing Provider  amiodarone (PACERONE) 200 MG tablet Take 200 mg by mouth 2 (two) times daily.    [provider]  atorvastatin (LIPITOR) 80 MG tablet TAKE 1 TABLET DAILY AT 6 PM Patient taking differently: Take 80 mg by mouth daily at 6 PM.  07/22/17   Bensimhon, Shaune Pascal, MD  docusate sodium (COLACE) 100 MG capsule Take 200 mg by mouth daily.  [provider]  furosemide (LASIX) 20 MG tablet Take 20 mg by mouth daily as needed for fluid.    [provider]  levothyroxine (SYNTHROID, LEVOTHROID) 150 MCG tablet Take 1 tablet (150 mcg total) by mouth daily before breakfast. 10/25/17   Bensimhon, Shaune Pascal, MD  Multiple Vitamins-Minerals (ICAPS PO) Take 1 tablet by mouth 2 (two) times daily.    [provider]  Multiple Vitamins-Minerals (MULTIVITAMIN PO) Take 1 tablet by mouth daily.    [provider]  pantoprazole (PROTONIX) 40 MG tablet TAKE 1 TABLET TWICE DAILY Patient taking differently: Take 40 mg by mouth 2 (two) times daily before a meal.  05/05/18   Bensimhon, Shaune Pascal, MD  promethazine (PHENERGAN) 12.5 MG tablet Take 12.5 mg by mouth every 6 (six) hours as needed for nausea or vomiting.    [provider]  ranolazine (RANEXA) 500 MG 12 hr tablet Take 1 tablet (500 mg total) by mouth 2 (two) times daily. 01/27/18   Bensimhon, Shaune Pascal, MD  sildenafil (REVATIO) 20 MG tablet Take 1 tablet (20 mg total) by mouth 3 (three) times daily. 04/21/18   Bensimhon, Shaune Pascal, MD  temazepam (RESTORIL) 30 MG capsule Take 1 capsule (30 mg total) by mouth at bedtime as needed for sleep. 06/30/18   Bensimhon, Shaune Pascal, MD  warfarin (COUMADIN) 2 MG tablet Take 2 mg by mouth every evening. Except 4 mg (2 tablets) on Tuesday    [provider]    zinc gluconate 50 MG tablet Take 50 mg by mouth 2 (two) times daily.     [provider]    Family History Family History  Problem Relation Age of Onset  . Heart attack Mother   . Coronary artery disease Mother   . Cancer Mother   . Heart disease Mother   . Hyperlipidemia Mother   . Hypertension Mother   . Coronary artery disease Father   . Stroke Father   . Cancer Father   . Heart disease Father        before age 20  . Hyperlipidemia Father   . Hypertension Father   . Heart attack Father   . Coronary artery disease Unknown     Social History Social History   Tobacco Use  . Smoking status: Former Smoker    Packs/day: 0.50    Years: 37.00    Pack years: 18.50    Types: Cigarettes    Last attempt to quit: 11/05/2001    Years since quitting: 16.6  . Smokeless tobacco: Never Used  Substance Use Topics  . Alcohol use: Yes    Alcohol/week: 0.0 standard drinks    Comment: 05/25/2013 "mixed drink couple times/month"  . Drug use: No     Allergies   Demerol [meperidine]   Review of Systems Review of Systems 10 Systems reviewed and are negative for acute change except as noted in the HPI.   Physical Exam Updated Vital Signs BP 98/71 (BP Location: Left Arm)   Pulse (!) 163   Resp (!) 21   SpO2 100%   Physical Exam  Constitutional: He is oriented to person, place, and time.  Patient is alert.  He does not have active respiratory distress.  He is very pale in appearance.  Generally well-nourished well-developed individual.  HENT:  Head: Normocephalic and atraumatic.  Mouth/Throat: Oropharynx is clear and moist.  Eyes: EOM are normal.  Cardiovascular:  LVAD peripheral pulses not palpable.  Pulmonary/Chest:  No respiratory distress.  Lungs  grossly clear.  Abdominal: Soft. He exhibits no distension. There is no tenderness. There is no guarding.  Musculoskeletal: Normal range of motion. He exhibits no edema or tenderness.  Neurological: He is alert and  oriented to person, place, and time. No cranial nerve deficit. He exhibits normal muscle tone. Coordination normal.  Skin: Skin is warm and dry. There is pallor.  Psychiatric: He has a normal mood and affect.     ED Treatments / Results  Labs (all labs ordered are listed, but only abnormal results are displayed) Labs Reviewed  PROTIME-INR  LACTATE DEHYDROGENASE    EKG No EKG for 9\4.   Radiology No results found.  Procedures .Sedation Date/Time: 07/09/2018 6:12 PM Performed by: Charlesetta Shanks, MD Authorized by: Charlesetta Shanks, MD   Consent:    Consent obtained:  Verbal   Consent given by:  Patient   Risks discussed:  Allergic reaction, dysrhythmia, inadequate sedation, nausea, prolonged hypoxia resulting in organ damage, prolonged sedation necessitating reversal, respiratory compromise necessitating ventilatory assistance and intubation and vomiting   Alternatives discussed:  Analgesia without sedation, anxiolysis and regional anesthesia Universal protocol:    Procedure explained and questions answered to patient or proxy's satisfaction: yes     Relevant documents present and verified: yes     Test results available and properly labeled: yes     Imaging studies available: yes     Required blood products, implants, devices, and special equipment available: yes     Site/side marked: yes     Immediately prior to procedure a time out was called: yes     Patient identity confirmation method:  Verbally with patient Indications:    Procedure necessitating sedation performed by:  Different physician   Intended level of sedation:  Deep Pre-sedation assessment:    Time since last food or drink:  Noon   NPO status caution: unable to specify NPO status     ASA classification: class 4 - patient with severe systemic disease that is a constant threat to life     Neck mobility: normal     Mouth opening:  3 or more finger widths   Thyromental distance:  4 finger widths   Mallampati  score:  I - soft palate, uvula, fauces, pillars visible   Pre-sedation assessments completed and reviewed: airway patency, cardiovascular function, hydration status, mental status, nausea/vomiting, pain level, respiratory function and temperature     Pre-sedation assessment completed:  07/09/2018 5:45 PM Immediate pre-procedure details:    Reassessment: Patient reassessed immediately prior to procedure     Reviewed: vital signs, relevant labs/tests and NPO status     Verified: bag valve mask available, emergency equipment available, intubation equipment available, IV patency confirmed, oxygen available and suction available   Procedure details (see MAR for exact dosages):    Preoxygenation:  Nasal cannula   Sedation:  Propofol   Intra-procedure monitoring:  Blood pressure monitoring, cardiac monitor, continuous pulse oximetry, frequent LOC assessments, frequent vital sign checks and continuous capnometry   Intra-procedure events: none     Total Provider sedation time (minutes):  20 Post-procedure details:    Post-sedation assessment completed:  07/09/2018 6:13 PM   Attendance: Constant attendance by certified staff until patient recovered     Recovery: Patient returned to pre-procedure baseline     Post-sedation assessments completed and reviewed: airway patency, cardiovascular function, hydration status, mental status, nausea/vomiting, pain level, respiratory function and temperature     Patient is stable for discharge or admission: yes  Patient tolerance:  Tolerated well, no immediate complications   (including critical care time) CRITICAL CARE Performed by: Charlesetta Shanks   Total critical care time: 20 minutes  Critical care time was exclusive of separately billable procedures and treating other patients.  Critical care was necessary to treat or prevent imminent or life-threatening deterioration.  Critical care was time spent personally by me on the following activities: development  of treatment plan with patient and/or surrogate as well as nursing, discussions with consultants, evaluation of patient's response to treatment, examination of patient, obtaining history from patient or surrogate, ordering and performing treatments and interventions, ordering and review of laboratory studies, ordering and review of radiographic studies, pulse oximetry and re-evaluation of patient's condition. Medications Ordered in ED Medications  propofol (DIPRIVAN) 10 mg/mL bolus/IV push (has no administration in time range)  phenylephrine 0.4-0.9 MG/10ML-% injection (has no administration in time range)  propofol (DIPRIVAN) 10 mg/mL bolus/IV push (60 mg Intravenous Given 07/09/18 1800)  phenylephrine (NEO-SYNEPHRINE) injection (40 mcg  Given 07/09/18 1801)     Initial Impression / Assessment and Plan / ED Course  I have reviewed the triage vital signs and the nursing notes.  Pertinent labs & imaging results that were available during my care of the patient were reviewed by me and considered in my medical decision making (see chart for details).     Consultation: Dr. Haroldine Laws in the emergency department just shortly after patient arrival.  He has initiated care and anticipated cardioversion.  Sedation provided by myself, cardioversion by Dr. Haroldine Laws.  Admitted to cardiology team.  Final Clinical Impressions(s) / ED Diagnoses   Final diagnoses:  Ventricular tachycardia ([place])  LVAD (left ventricular assist device) present [facility])   Patient presents with ventricular tachycardia.  Resuscitation initiated with fluids.  Airway remained stable.  Mental status remained stable.  Patient with propofol which patient tolerated well.  Cardioverted under the direction of Dr. Ronna Polio.  Patient tolerated this well with significant clinical improvement. ED Discharge Orders    None       Charlesetta Shanks, MD 07/21/18 1256

## 2018-07-09 NOTE — Sedation Documentation (Signed)
Pt cardioverted successfully, zoll charged to 200J

## 2018-07-09 NOTE — ED Notes (Signed)
Pt easy to arouse, answering questions appropriately.

## 2018-07-09 NOTE — Progress Notes (Signed)
Pt called to report he started feeling bad this am, remote transmission sent to device clinic and was found to be in V tach. Wife driving patient to ED.   Arrived to ED via private vehicle, [name] reported one low flow alarm during ride here.  Pt awake, alert. VAD alarming, Dr. Haroldine Laws to bedside to evaluate patient. Pt in VT.  Vital Signs:  Doppler Pressure: 80 Automatc BP: 69/50 (58) HR:  166 SPO2: 100 % on 2 L/    LVAD assessment: HM II: VAD Speed: 8800 rpms Flow: 2.3 Power: 3.7 w    PI: 4.1 Alarms: 70 Low Flow alarms - started @ 11:30 this morning Events: few today Fixed speed: 8800 Low speed limit: 8200    . Post Cardioversion: sinus rhtythm  Vital Signs:  Automatc BP: 114/104 (109) HR:  82 SPO2: 100 % on 2 L  LVAD assessment: HM II: VAD Speed: 8800 rpms Flow: 3.0 Power: 3.9 w    PI: 6.5  Zada Girt RN, VAD Coordinator 24/7 VAD Pager: [phone]

## 2018-07-09 NOTE — Plan of Care (Signed)
Patient alert and oriented.  Received from ED via stretcher.  LVAD dressing intact, as per patient changed every Sunday, last changed on 07/06/2018.  Amiodarone gtt infusing, NSR noted, patient has had no alarm events upon arrival as of yet.  States that he had several upon arrival.  Red circular mark noted on chest from cardioversion performed in Ed.  Denies any pain at this time.  Monitoring as per protocol.

## 2018-07-09 NOTE — Plan of Care (Signed)
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  No education needed a this time relating to care and maintenance.

## 2018-07-09 NOTE — CV Procedure (Signed)
     DIRECT CURRENT CARDIOVERSION  NAME:  [name]   MRN: [id number] DOB:  [birth year]   ADMIT DATE: 07/09/2018   INDICATIONS: Ventricular tachycardia   PROCEDURE:   Informed consent was obtained prior to the procedure. The risks, benefits and alternatives for the procedure were discussed and the patient comprehended these risks. Once an appropriate time out was taken, the patient had the defibrillator pads placed in the anterior and posterior position. The patient then underwent sedation by Dr. Elizabeth Sauer in the ER with propofol 60g. Once an appropriate level of sedation was achieved, the patient received a single biphasic, synchronized 200J shock with prompt conversion to sinus rhythm. No apparent complications.    Glori Bickers, MD  6:22 PM

## 2018-07-09 NOTE — ED Notes (Signed)
1L NS IV started per MD

## 2018-07-09 NOTE — H&P (Signed)
Advanced Heart Failure VAD History and Physical Note   PCP-Cardiologist: No primary care provider on file.   Reason for Admission: Ventricular tachycardia  HPI:    [name] is a 77 y.o. male  with h/o VT, PAD and severe systolic HF (EF 53-29%) due to mixed ischemic/nonischemic CM he is s/p HM II LVAD implant for destination therapy on October 19, 2013.VAD implantation was complicated by VT, syncope, AF/Aflutter and RHF. Required short term milrinone.   GU Event 09/01/14 had impacted ureteral stone and underwent cystoscopy and flexible ureteroscopy with lithotripsy and basket extraction.  09/2015 Hematuria and chronic nephrolithiasis. Renal US showed mild hyronephrosis and bilateral renal cysts. Dr. Risa Grill took back for repeat lithotripsy and stone extraction by ureteroscopy and J stent placement. 12/18 Recurrent hematuria. 63mm left ureteral stone. Passed with tamulosin 1/19   Admitted with recurrent hematuria with ureteral stone. Stone removed but found to have large bladder mass which was removed. Course c/b obstructive uropathy and AKI requiring 1 session of HD.  3/19 Bladder chemo stopped after 2 treatments as unable to tolerate 7/12 s/p TURB with right ureteral stent placement.   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  4/2-18 - Episode of melena. Hgb 10.9-> 8.7. EGD/colonoscopy negative 12/18 - Melena with syncope  Hgb 6.5. Transfused 5u. 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  01/2016 R CEA. He was started on Plavix.  01/2016 Lumbar fusion L3-L5 with pedicle screws posterolateral arthrodesis with BMP, allograft  VT events 9/17 with high fevers and productive cough. Treated with levaquin x 7 days for acute bronchitis. Also found to have VT and underwent DC-CV. Seen by EP and amio increased 10/17 speed turned down to 8800 due to RV failure. 10/18 with slow VT and ADHF.Cardioverted  in ED and amio uptitrated.  04/01/18 recurrent VT. Underwent DC-CV in ER 06/10/18 AFib underwent DC-CV 07/02/18 AFL underwent DC-Cv  Significant VAD Events  03/2017>percutaneous lead repair   Underwent DC-CV for AFL on 07/02/18. Amio increased to 200 bid.  Was doing well until about 9-10a this morning when he developed dizziness. Wife ad him push fluids all day. But remained weak. No CP or SOB. No syncope.  Remote transmission sent to device clinic and was found to be in V tach. Wife drove hi to ED. Developed one low flow alarm en route.  On arrival in ED was in sustained VT with rates in 160s. Multiple low flow alarms. MAPS in 36s.  Given 1L IVF and two doses of neosynephrine with improvement in MAPs to 90-100 range. Sedated by Dr. Johnney Killian in ER and I emergently cardioverted with 200J back to NSR.   Vital Signs:  Doppler Pressure:80 Automatc BP: 69/50 (58) HR: 166 SPO2: 100 % on 2 L/   LVAD assessment: HM II: VAD Speed: 8800 rpms Flow: 2.3 Power: 3.7 w PI: 4.1 Alarms: 70 Low Flow alarms - started @ 11:30 this morning Events: few today Fixed speed: 8800 Low speed limit: 8200    . Post Cardioversion: sinus rhtythm  Vital Signs:  Automatc BP: 114/104 (109) HR: 82 SPO2: 100 % on 2 L  LVAD assessment: HM II: VAD Speed: 8800 rpms Flow: 3.0 Power: 3.9 w PI: 6.5   Review of Systems: [y] = yes, [ ]  = no   General: Weight gain [ ] ; Weight loss [ ] ; Anorexia [ ] ; Fatigue Blue.Reese ]; Fever [ ] ;  Chills [ ] ; Weakness Blue.Reese ]  Cardiac: Chest pain/pressure [ ] ; Resting SOB [ ] ; Exertional SOB [ ] ; Orthopnea [ ] ; Pedal Edema [ ] ; Palpitations [ ] ; Syncope [ ] ; Presyncope [ ] ; Paroxysmal nocturnal dyspnea[ ]   Pulmonary: Cough [ ] ; Wheezing[ ] ; Hemoptysis[ ] ; Sputum [ ] ; Snoring [ ]   GI: Vomiting[ ] ; Dysphagia[ ] ; Melena[ ] ; Hematochezia [ ] ; Heartburn[ ] ; Abdominal pain [ ] ; Constipation [ ] ; Diarrhea [ ] ; BRBPR [ ]   GU: Hematuria[ ] ; Dysuria [ ] ; Nocturia[ ]   Vascular:  Pain in legs with walking [ ] ; Pain in feet with lying flat [ ] ; Non-healing sores [ ] ; Stroke [ ] ; TIA [ ] ; Slurred speech [ ] ;  Neuro: Headaches[ ] ; Vertigo[ ] ; Seizures[ ] ; Paresthesias[ ] ;Blurred vision [ ] ; Diplopia [ ] ; Vision changes [ ]   Ortho/Skin: Arthritis Blue.Reese ]; Joint pain Blue.Reese ]; Muscle pain [ ] ; Joint swelling [ ] ; Back Pain [ ] ; Rash [ ]   Psych: Depression[ ] ; Anxiety[ ]   Heme: Bleeding problems [ ] ; Clotting disorders [ ] ; Anemia [ ]   Endocrine: Diabetes [ ] ; Thyroid dysfunction[y ]    Home Medications Prior to Admission medications   Medication Sig Start Date End Date Taking? Authorizing Provider  amiodarone (PACERONE) 200 MG tablet Take 200 mg by mouth 2 (two) times daily.    [provider]  atorvastatin (LIPITOR) 80 MG tablet TAKE 1 TABLET DAILY AT 6 PM Patient taking differently: Take 80 mg by mouth daily at 6 PM.  07/22/17   Bensimhon, Shaune Pascal, MD  docusate sodium (COLACE) 100 MG capsule Take 200 mg by mouth daily.     [provider]  furosemide (LASIX) 20 MG tablet Take 20 mg by mouth daily as needed for fluid.    [provider]  levothyroxine (SYNTHROID, LEVOTHROID) 150 MCG tablet Take 1 tablet (150 mcg total) by mouth daily before breakfast. 10/25/17   Bensimhon, Shaune Pascal, MD  Multiple Vitamins-Minerals (ICAPS PO) Take 1 tablet by mouth 2 (two) times daily.    [provider]  Multiple Vitamins-Minerals (MULTIVITAMIN PO) Take 1 tablet by mouth daily.    [provider]  pantoprazole (PROTONIX) 40 MG tablet TAKE 1 TABLET TWICE DAILY Patient taking differently: Take 40 mg by mouth 2 (two) times daily before a meal.  05/05/18   Bensimhon, Shaune Pascal, MD  promethazine (PHENERGAN) 12.5 MG tablet Take 12.5 mg by mouth every 6 (six) hours as needed for nausea or vomiting.    [provider]  ranolazine (RANEXA) 500 MG 12 hr tablet Take 1 tablet (500 mg total) by mouth 2 (two) times daily. 01/27/18   Bensimhon, Shaune Pascal, MD    sildenafil (REVATIO) 20 MG tablet Take 1 tablet (20 mg total) by mouth 3 (three) times daily. 04/21/18   Bensimhon, Shaune Pascal, MD  temazepam (RESTORIL) 30 MG capsule Take 1 capsule (30 mg total) by mouth at bedtime as needed for sleep. 06/30/18   Bensimhon, Shaune Pascal, MD  warfarin (COUMADIN) 2 MG tablet Take 2 mg by mouth every evening. Except 4 mg (2 tablets) on Tuesday    [provider]  zinc gluconate 50 MG tablet Take 50 mg by mouth 2 (two) times daily.     [provider]    Past Medical History: Past Medical History:  Diagnosis Date  . Arthritis   . Automatic implantable cardioverter-defibrillator in situ    a. s/p Medtronic Evera device serial [id number]    . Bradycardia   .  CAD (coronary artery disease)    a. s/p MI 1982;  s/p DES to CFX 2011;  s/p NSTEMI 4/12 in setting of VTach;  cath 7/12:  LM ok, LAD mid 30-40%, Dx's with 40%, mCFX stent patent, prox to mid RCA occluded with R to R and L to R collats.  Medical Tx was continued  . Chronic systolic heart failure ([place])    a. s/p LVAD 10/2013 for destination therapy  . CKD (chronic kidney disease)   . CVD (cerebrovascular disease)    a. s/p L CEA 2008  . Cytopenia   . Dysrhythmia    AFIB  . History of GI bleed    a. on ASA- started on plavix during 10/2015 admission for CVA  . HLD (hyperlipidemia)   . HTN (hypertension)   . Hypothyroidism   . Inappropriate therapy from implantable cardioverter-defibrillator    a. ATP for sinus tach SVT wavelet identified SVT but was no  passive (2014)  . Ischemic cardiomyopathy   . 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 L Ear   . Myocardial infarction ([place])    1992  . PAF (paroxysmal atrial fibrillation) ([place])   . PVD (peripheral vascular disease) ([place])    a. 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 Shortness of breath dyspnea    W/ EXERTION   . Sleep apnea    NO CPAP NOW  HE SENT IT  BACK YRS AGO  . Stroke [facility])    a. 10/2015 admission   . Ventricular tachycardia ([place])    a. s/p AICD;   h/o ICD shock;  Amiodarone Rx. S/P ablation in 2012    Past Surgical History: Past Surgical History:  Procedure Laterality Date  . BACK SURGERY    . CARDIAC CATHETERIZATION     "several" (05/25/2013)  . CARDIAC DEFIBRILLATOR PLACEMENT  2003; 2005; 05/25/2013   2014: Medtronic Evera device serial number [id number]  . CARDIAC ELECTROPHYSIOLOGY STUDY AND ABLATION  2012  . CARDIOVERSION N/A 10/15/2013   Procedure: CARDIOVERSION;  Surgeon: Jolaine Artist, MD;  Location: [place];  Service: Cardiovascular;  Laterality: N/A;  . CARDIOVERSION N/A 11/04/2013   Procedure: CARDIOVERSION;  Surgeon: Larey Dresser, MD;  Location: [employer];  Service: Cardiovascular;  Laterality: N/A;  . CARDIOVERSION N/A 06/04/2014   Procedure: CARDIOVERSION;  Surgeon: Jolaine Artist, MD;  Location: [facility] ENDOSCOPY;  Service: Cardiovascular;  Laterality: N/A;  . CARDIOVERSION N/A 03/02/2015   Procedure: CARDIOVERSION;  Surgeon: Jolaine Artist, MD;  Location: [facility] ENDOSCOPY;  Service: Cardiovascular;  Laterality: N/A;  . CARDIOVERSION N/A 01/19/2016   Procedure: CARDIOVERSION;  Surgeon: Larey Dresser, MD;  Location: [place];  Service: Cardiovascular;  Laterality: N/A;  . CARDIOVERSION N/A 06/10/2018   Procedure: CARDIOVERSION;  Surgeon: Jolaine Artist, MD;  Location: [facility] ENDOSCOPY;  Service: Cardiovascular;  Laterality: N/A;  . CARDIOVERSION N/A 07/02/2018   Procedure: CARDIOVERSION;  Surgeon: Jolaine Artist, MD;  Location: [place];  Service: Cardiovascular;  Laterality: N/A;  . CAROTID ENDARTERECTOMY Left ~ 2007  . CATARACT EXTRACTION W/ INTRAOCULAR LENS  IMPLANT, BILATERAL Bilateral 2000  . CHOLECYSTECTOMY  12/15/?2010  . COLONOSCOPY WITH PROPOFOL N/A 06/27/2017   Procedure: COLONOSCOPY WITH PROPOFOL;  Surgeon: Ronald Lobo, MD;  Location: [place];  Service: Endoscopy;  Laterality:  N/A;  . CORONARY ANGIOPLASTY  1992  . CORONARY  ANGIOPLASTY WITH STENT PLACEMENT     "last one was 11/2012  (05/25/2013)  . CYSTOSCOPY W/ RETROGRADES Bilateral 05/16/2018   Procedure: CYSTOSCOPY WITH RETROGRADE PYELOGRAM;  Surgeon: Lucas Mallow, MD;  Location: [place];  Service: Urology;  Laterality: Bilateral;  . CYSTOSCOPY WITH RETROGRADE PYELOGRAM, URETEROSCOPY AND STENT PLACEMENT Left 08/09/2014   Procedure: CYSTOSCOPY WITH RETROGRADE PYELOGRAM, URETEROSCOPY AND STENT PLACEMENT;  Surgeon: Bernestine Amass, MD;  Location: [facility];  Service: Urology;  Laterality: Left;  . CYSTOSCOPY WITH RETROGRADE PYELOGRAM, URETEROSCOPY AND STENT PLACEMENT Left 09/01/2014   Procedure: CYSTOSCOPY WITH URETEROSCOPY AND STENT REMOVAL;  Surgeon: Bernestine Amass, MD;  Location: [facility];  Service: Urology;  Laterality: Left;  . CYSTOSCOPY WITH RETROGRADE PYELOGRAM, URETEROSCOPY AND STENT PLACEMENT Left 09/20/2014   Procedure: CYSTOSCOPY WITH RETROGRADE PYELOGRAM, URETEROSCOPY AND STENT PLACEMENT, stone removal;  Surgeon: Bernestine Amass, MD;  Location[facility];  Service: Urology;  Laterality: Left;  . CYSTOSCOPY WITH RETROGRADE PYELOGRAM, URETEROSCOPY AND STENT PLACEMENT Bilateral 12/04/2017   Procedure: CYSTOSCOPY WITH BILATERAL RETROGRADE PYELOGRAM, LEFT URETEROSCOPY AND STENT PLACEMENT;  Surgeon: Lucas Mallow, MD;  Location: [place];  Service: Urology;  Laterality: Bilateral;  . DOPPLER ECHOCARDIOGRAPHY  2011  . ELBOW ARTHROSCOPY Right 1990's  . ELECTROPHYSIOLOGIC STUDY N/A 07/19/2016   Procedure: Cardioversion;  Surgeon: Evans Lance, MD;  Location: [place] CV LAB;  Service: Cardiovascular;  Laterality: N/A;  . ENDARTERECTOMY Right 01/13/2016   Procedure: RIGHT CAROTID ARTERY ENDARTERECTOMY;  Surgeon: Mal Misty, MD;  Location: [place];  Service: Vascular;  Laterality: Right;  . ENTEROSCOPY Left 02/23/2017   Procedure: ENTEROSCOPY;  Surgeon: Ronald Lobo, MD;  Location: [facility] ENDOSCOPY;  Service: Endoscopy;   Laterality: Left;  . ENTEROSCOPY N/A 10/17/2017   Procedure: ENTEROSCOPY;  Surgeon: Yetta Flock, MD;  Location: [facility] ENDOSCOPY;  Service: Gastroenterology;  Laterality: N/A;  LVAD pt.    . ESOPHAGOGASTRODUODENOSCOPY N/A 02/15/2015   Procedure: ESOPHAGOGASTRODUODENOSCOPY (EGD);  Surgeon: Carol Ada, MD;  Location: [facility] ENDOSCOPY;  Service: Endoscopy;  Laterality: N/A;  LVAD  . EYE SURGERY     VITRECTOMY  LEFT 05/2012  . FEMORAL-POPLITEAL BYPASS GRAFT Right 2011  . FEMORAL-POPLITEAL BYPASS GRAFT Left 05/2002   Archie Endo 05/06/2002 (05/25/2013)  . HEEL SPUR SURGERY Right 1990's  . HOLMIUM LASER APPLICATION Left 24/07/7352   Procedure: HOLMIUM LASER APPLICATION;  Surgeon: Bernestine Amass, MD;  [facility];  Service: Urology;  Laterality: Left;  . HOLMIUM LASER APPLICATION Left 2/99/2426   Procedure: HOLMIUM LASER APPLICATION;  Surgeon: Lucas Mallow, MD;  Location: [place];  Service: Urology;  Laterality: Left;  . IMPLANTABLE CARDIOVERTER DEFIBRILLATOR GENERATOR CHANGE N/A 05/25/2013   Procedure: IMPLANTABLE CARDIOVERTER DEFIBRILLATOR GENERATOR CHANGE;  Surgeon: Deboraha Sprang, MD;  Location: [facility] CATH LAB;  Service: Cardiovascular;  Laterality: N/A;  . INSERTION OF IMPLANTABLE LEFT VENTRICULAR ASSIST DEVICE N/A 10/19/2013   Procedure: INSERTION OF IMPLANTABLE LEFT VENTRICULAR ASSIST DEVICE;  Surgeon: Gaye Pollack, MD;  Location: [place];  Service: Open Heart Surgery;  Laterality: N/A;  CIRC ARREST  NITRIC OXIDE  MEDTRONIC ICD  . INTRAOPERATIVE TRANSESOPHAGEAL ECHOCARDIOGRAM N/A 10/19/2013   Procedure: INTRAOPERATIVE TRANSESOPHAGEAL ECHOCARDIOGRAM;  Surgeon: Gaye Pollack, MD;  Location: [facility] OR;  Service: Open Heart Surgery;  Laterality: N/A;  . IR FLUORO GUIDE CV LINE RIGHT  12/10/2017  . IR US GUIDE VASC ACCESS RIGHT  12/10/2017  . KNEE ARTHROSCOPY Bilateral 1990's  . LEAD REVISION N/A 06/05/2013   Procedure: LEAD  REVISION;  Surgeon: Thompson Grayer, MD;  Location: [facility] CATH LAB;  Service:  Cardiovascular;  Laterality: N/A;  . LEFT HEART CATHETERIZATION WITH CORONARY ANGIOGRAM N/A 11/24/2012   Procedure: LEFT HEART CATHETERIZATION WITH CORONARY ANGIOGRAM;  Surgeon: Peter M Martinique, MD;  Location: [facility] CATH LAB;  Service: Cardiovascular;  Laterality: N/A;  . LIPOMA EXCISION Left 2013   "near ear" (05/25/2013)  . [place]; 2000's  . PATCH ANGIOPLASTY Right 01/13/2016   Procedure: WITH  PATCH ANGIOPLASTY;  Surgeon: Mal Misty, MD;  Location: [place];  Service: Vascular;  Laterality: Right;  . RENAL ARTERY BYPASS Bilateral 2003  . REVASCULARIZATION / IN-SITU GRAFT LEG    . RIGHT HEART CATH N/A 11/29/2017   Procedure: RIGHT HEART CATH;  Surgeon: Jolaine Artist, MD;  Location: [place] CV LAB;  Service: Cardiovascular;  Laterality: N/A;  . RIGHT HEART CATHETERIZATION N/A 09/17/2013   Procedure: RIGHT HEART CATH;  Surgeon: Jolaine Artist, MD;  Location: [facility] CATH LAB;  Service: Cardiovascular;  Laterality: N/A;  . RIGHT HEART CATHETERIZATION N/A 10/14/2013   Procedure: RIGHT HEART CATH;  Surgeon: Jolaine Artist, MD;  Location: [facility] CATH LAB;  Service: Cardiovascular;  Laterality: N/A;  . RIGHT HEART CATHETERIZATION N/A 11/16/2013   Procedure: RIGHT HEART CATH;  Surgeon: Jolaine Artist, MD;  Location: [facility] CATH LAB;  Service: Cardiovascular;  Laterality: N/A;  . RIGHT HEART CATHETERIZATION N/A 07/13/2014   Procedure: RIGHT HEART CATH;  Surgeon: Jolaine Artist, MD;  Location: [facility] CATH LAB;  Service: Cardiovascular;  Laterality: N/A;  . TEE WITHOUT CARDIOVERSION N/A 11/04/2013   Procedure: TRANSESOPHAGEAL ECHOCARDIOGRAM (TEE);  Surgeon: Larey Dresser, MD;  Location: [place];  Service: Cardiovascular;  Laterality: N/A;  . TEE WITHOUT CARDIOVERSION N/A 03/02/2015   Procedure: TRANSESOPHAGEAL ECHOCARDIOGRAM (TEE);  Surgeon: Jolaine Artist, MD;  Location: [facility] ENDOSCOPY;  Service: Cardiovascular;  Laterality: N/A;  . TEE WITHOUT CARDIOVERSION N/A 01/19/2016    Procedure: TRANSESOPHAGEAL ECHOCARDIOGRAM (TEE);  Surgeon: Larey Dresser, MD;  Location: [place];  Service: Cardiovascular;  Laterality: N/A;  . TONSILLECTOMY  1946  . TRANSURETHRAL RESECTION OF BLADDER TUMOR N/A 12/04/2017   Procedure: TRANSURETHRAL RESECTION OF BLADDER TUMOR (TURBT);  Surgeon: Lucas Mallow, MD;  Location: [place];  Service: Urology;  Laterality: N/A;  . TRANSURETHRAL RESECTION OF BLADDER TUMOR N/A 05/16/2018   Procedure: TRANSURETHRAL RESECTION OF BLADDER TUMOR (TURBT);  Surgeon: Lucas Mallow, MD;  Location: [place];  Service: Urology;  Laterality: N/A;  . TUMOR EXCISION Left 1960's   "fatty tumor" (05/25/2013)  . V-TACH ABLATION N/A 09/12/2011   Procedure: V-TACH ABLATION;  Surgeon: Evans Lance, MD;  Location: [facility] CATH LAB;  Service: Cardiovascular;  Laterality: N/A;  . V-TACH ABLATION N/A 06/08/2013   Procedure: V-TACH ABLATION;  Surgeon: Evans Lance, MD;  Location: [facility] CATH LAB;  Service: Cardiovascular;  Laterality: N/A;    Family History: Family History  Problem Relation Age of Onset  . Heart attack Mother   . Coronary artery disease Mother   . Cancer Mother   . Heart disease Mother   . Hyperlipidemia Mother   . Hypertension Mother   . Coronary artery disease Father   . Stroke Father   . Cancer Father   . Heart disease Father        before age 21  . Hyperlipidemia Father   . Hypertension Father   . Heart attack Father   . Coronary artery disease Unknown  Social History: Social History   Socioeconomic History  . Marital status: Divorced    Spouse name: Not on file  . Number of children: 2  . Years of education: Not on file  . Highest education level: Not on file  Occupational History  . Occupation: retired    Comment: Health visitor  . Occupation: Retired    Comment: Security [employer]  . Occupation: Part-time    Comment: Security at [employer]  . Financial resource strain: Not on file  . Food insecurity:     Worry: Not on file    Inability: Not on file  . Transportation needs:    Medical: Not on file    Non-medical: Not on file  Tobacco Use  . Smoking status: Former Smoker    Packs/day: 0.50    Years: 37.00    Pack years: 18.50    Types: Cigarettes    Last attempt to quit: 11/05/2001    Years since quitting: 16.6  . Smokeless tobacco: Never Used  Substance and Sexual Activity  . Alcohol use: Yes    Alcohol/week: 0.0 standard drinks    Comment: 05/25/2013 "mixed drink couple times/month"  . Drug use: No  . Sexual activity: Never  Lifestyle  . Physical activity:    Days per week: Not on file    Minutes per session: Not on file  . Stress: Not on file  Relationships  . Social connections:    Talks on phone: Not on file    Gets together: Not on file    Attends religious service: Not on file    Active member of club or organization: Not on file    Attends meetings of clubs or organizations: Not on file    Relationship status: Not on file  Other Topics Concern  . Not on file  Social History Narrative   Lives with sig other.    Allergies:  Allergies  Allergen Reactions  . Demerol [Meperidine] Other (See Comments)    Paralysis/ Could only move eyes    Objective:    Vital Signs:   Pulse Rate:  [77-168] 77 (09/04 1810) Resp:  [15-21] 15 (09/04 1810) BP: (69-115)/(50-99) 115/99 (09/04 1810) SpO2:  [100 %] 100 % (09/04 1810)   There were no vitals filed for this visit.  Mean arterial Pressure initially 50s now 90-100  Physical Exam    General:  [place] weak appearing .  HEENT: normal  Neck: supple. JVP not elevated.  Carotids 2+ bilat; no bruits. No lymphadenopathy or thryomegaly appreciated. Cor: LVAD hum.  Lungs: Clear. Abdomen: obese soft, nontender, non-distended. No hepatosplenomegaly. No bruits or masses. Good bowel sounds. Driveline site clean. Anchor in place.  Extremities: no cyanosis, clubbing, rash. Warm no edema  Neuro: alert & oriented x 3. No focal deficits.  Moves all 4 without problem    Telemetry   VT 160s Personally reviewed   EKG   Pending   Labs    Basic Metabolic Panel: No results for input(s): NA, K, CL, CO2, GLUCOSE, BUN, CREATININE, CALCIUM, MG, PHOS in the last 168 hours.  Liver Function Tests: No results for input(s): AST, ALT, ALKPHOS, BILITOT, PROT, ALBUMIN in the last 168 hours. No results for input(s): LIPASE, AMYLASE in the last 168 hours. No results for input(s): AMMONIA in the last 168 hours.  CBC: No results for input(s): WBC, NEUTROABS, HGB, HCT, MCV, PLT in the last 168 hours.  Cardiac Enzymes: No results for input(s): CKTOTAL, CKMB, CKMBINDEX,  TROPONINI in the last 168 hours.  BNP: BNP (last 3 results) Recent Labs    08/17/17 1900  BNP 627.8*    ProBNP (last 3 results) No results for input(s): PROBNP in the last 8760 hours.   CBG: No results for input(s): GLUCAP in the last 168 hours.  Coagulation Studies: Recent Labs    07/08/18  INR 1.9*    Other results:    Imaging     No results found.    Patient Profile:   [name] is a 77 y.o. male with h/o VT, PAD and severe systolic HF (EF 75-17%) due to mixed ischemic/nonischemic CM he is s/p HM II LVAD implant for destination therapy on October 19, 2013. VAD implantation was complicated by VT, syncope, AF/Aflutter and RHF. Required short term milrinone.  Presented today with VT and hypotension. Underwent emergent DC-CV in ER on 07/09/18   Assessment/Plan:    1. Recurrent VTwith hypotension and low flow alarms on VAD - underwent DC-CV 9/17, 08/17/17, and 04/01/18 - s/p emergent DC-CV in  ER 07/09/18  -Continue amiodarone 200 mg daily  - Keep K > 4.0 Mg > 2.0 - admit to ICU for monitoring.  - EP to see in am  2. Chronic systolic HF: s/p HM-II LVAD 10/2014.  - Recent RHC in 1/19 with mildly low output. Started on milrinone without benefit so it was stopped.  -Volume status stable on exam. Takes PRN lasix at home. - s/p  previous driveline repair 0/0174. No further pump stops - VAD interrogated personally. Low flows in setting of VT. Now stable  3. 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  - Had 2 BCG infusions with Dr. Gloriann Loan but discontinued due to inability to tolerate - s/p repeat resection 7/19  4. CKD, stage III:  - Had ESRD in setting of obstructive uropathy and ATN. Required HD x1 (11/2017) - creatinine 07/02/18 was 1.4 - Monitor daily BMET  5. 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 Continue coumadin and plavix.(with h/o CVA). - Admitted 4/18 with recurrent GIB. Transfused 2u. EGD/colon negative - Admitted 12./18. hgb 6.5, Transfused 5u. EGD with 2 AVMs treated with APC - Had been getting octreotide infusions but too expensive for him. Willcontinue to hold for now - Monitor CBC daily. Denies bleeding.   6. Atrial arrhythmias: Atrial fibrillation, atrial tachycardia.  - s/p multiple DC-CVs last 07/02/18  - Continue amiodarone   7. HTN:  -BP low on arrival. Now improved   8. LVAD:  - VAD interrogation as above.  - LDHpending 9. Anticoagulation management:  - Goal INR 2-2.5 - INR pending. Discussed dosing with PharmD personally.  10. Lumbar compression fracture: s/p L3-L5 fusion 02/03/16. - Resolved  11. 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  - Continue atorvastatin. (and warfarin INR goal 2-2.5). S/p R CEA 01/13/16. Plavix stopped 12/18 due to recurrent GIB  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 Forde Dandy in past.  - Amio restarted due to recurrent atrial arrhythmias and VT.  - Recent FT4 elevated. Synthroid cut to 150 daily in 12/18.  - Recheck TFTs.   13. Insomnia - Continue temazepam 30 qhs PRN.   CRITICAL CARE Performed by: Glori Bickers  Total critical care time: 55 minutes  Critical care time was exclusive of separately billable  procedures and treating other patients.  Critical care was necessary to treat or prevent imminent  or life-threatening deterioration.  Critical care was time spent personally by me (independent of midlevel providers or residents) on the following activities: development of treatment plan with patient and/or surrogate as well as nursing, discussions with consultants, evaluation of patient's response to treatment, examination of patient, obtaining history from patient or surrogate, ordering and performing treatments and interventions, ordering and review of laboratory studies, ordering and review of radiographic studies, pulse oximetry and re-evaluation of patient's condition.    Length of Stay: 0  Glori Bickers, MD 07/09/2018, 6:24 PM  VAD Team Pager [phone] (7am - 7am) +++VAD ISSUES ONLY+++   Advanced Heart Failure Team Pager [phone] (M-F; [place])  Please contact [facility] for night-coverage after hours (4p -7a ) and weekends on [email or web address] for all non- LVAD Issues

## 2018-07-10 LAB — BASIC METABOLIC PANEL
ANION GAP: 8 (ref 5–15)
BUN: 36 mg/dL — ABNORMAL HIGH (ref 8–23)
CALCIUM: 8.4 mg/dL — AB (ref 8.9–10.3)
CO2: 22 mmol/L (ref 22–32)
CREATININE: 1.48 mg/dL — AB (ref 0.61–1.24)
Chloride: 104 mmol/L (ref 98–111)
GFR calc non Af Amer: 44 mL/min — ABNORMAL LOW (ref >60)
GFR, EST AFRICAN AMERICAN: 50 mL/min — AB (ref >60)
Glucose, Bld: 110 mg/dL — ABNORMAL HIGH (ref 70–99)
Potassium: 4 mmol/L (ref 3.5–5.1)
Sodium: 134 mmol/L — ABNORMAL LOW (ref 135–145)

## 2018-07-10 LAB — CBC
HCT: 30.9 % — ABNORMAL LOW (ref 39.0–52.0)
HEMOGLOBIN: 9.9 g/dL — AB (ref 13.0–17.0)
MCH: 29.3 pg (ref 26.0–34.0)
MCHC: 32 g/dL (ref 30.0–36.0)
MCV: 91.4 fL (ref 78.0–100.0)
Platelets: 129 10*3/uL — ABNORMAL LOW (ref 150–400)
RBC: 3.38 MIL/uL — ABNORMAL LOW (ref 4.22–5.81)
RDW: 22.1 % — AB (ref 11.5–15.5)
WBC: 5.1 10*3/uL (ref 4.0–10.5)

## 2018-07-10 LAB — PROTIME-INR
INR: 2.38
PROTHROMBIN TIME: 25.8 s — AB (ref 11.4–15.2)

## 2018-07-10 LAB — T4, FREE: Free T4: 1.05 ng/dL (ref 0.82–1.77)

## 2018-07-10 LAB — TSH: TSH: 8.91 u[IU]/mL — AB (ref 0.350–4.500)

## 2018-07-10 LAB — LACTATE DEHYDROGENASE: LDH: 216 U/L — ABNORMAL HIGH (ref 98–192)

## 2018-07-10 MED ORDER — MEXILETINE HCL 150 MG PO CAPS
300.0000 mg | ORAL_CAPSULE | Freq: Two times a day (BID) | ORAL | Status: DC
  Filled 2018-07-10: qty 2

## 2018-07-10 MED ORDER — MEXILETINE HCL 250 MG PO CAPS
250.0000 mg | ORAL_CAPSULE | Freq: Two times a day (BID) | ORAL | Status: DC
  Administered 2018-07-10 – 2018-07-11 (×3): 250 mg via ORAL
  Filled 2018-07-10 (×3): qty 1

## 2018-07-10 MED ORDER — WARFARIN SODIUM 2 MG PO TABS
2.0000 mg | ORAL_TABLET | Freq: Once | ORAL | Status: AC
  Administered 2018-07-10: 2 mg via ORAL
  Filled 2018-07-10: qty 1

## 2018-07-10 MED ORDER — BOOST PLUS PO LIQD
237.0000 mL | Freq: Three times a day (TID) | ORAL | Status: DC
  Administered 2018-07-10 – 2018-07-14 (×5): 237 mL via ORAL
  Filled 2018-07-10 (×14): qty 237

## 2018-07-10 NOTE — Progress Notes (Signed)
Initial Nutrition Assessment  DOCUMENTATION CODES:   Severe malnutrition in context of chronic illness  INTERVENTION:   Boost Plus TID between meals, each supplement contains 350 kcals and 14 g of protein  MVI daily  Consider diet liberalization   NUTRITION DIAGNOSIS:   Severe Malnutrition related to chronic illness(CHF s/p LVAD in 2014, bladder tumor) as evidenced by severe muscle depletion, percent weight loss, severe fat depletion.  GOAL:   Patient will meet greater than or equal to 90% of their needs   MONITOR:   PO intake, Supplement acceptance, Labs, Other (Comment)  REASON FOR ASSESSMENT:   Malnutrition Screening Tool    ASSESSMENT:    77 yo admitted with recurrent VT with hypotension and low flow alarms on VAD. PMH includes severe systolic CHF with mixed CM s/p HM II LVAD destination therapy in 10/2013, PAD. Pt also with hx of bladder tumor s/p resection on 11/2017; pt only had 2 chemo infusions before treatment was discontinued due to intolerance   Pt reports poor appetite, no taste for anything. 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 Pt does indicate that he has noticed a further decline in appetite however since receiving chemo and has not gained it back. Pt is aware of his muscle loss and states he knows he will probably never regain his muscle but he just wants to be able to enjoy food again. Pt indicates he does eat 3-4 times per day  Pt reports losing 40 pounds since 12/2017 with initiation of chemo. Pt reports inability to gain weight back. Current wt.  Pt reports current weight around 171 pounds. 18.9% weight loss in 6 months which is significant for time frame.   Premier Protein Shakes BID at home; pt also takes MVI.  Pt agreeable to Boost Plus supplement while here in hospital; pt does not like Ensure  Enlive (tried last admission)  Height entered in computer as 9 feet 7 inches tall. Pt indicates height of 6 feet. Height modified in computer  Pt indicate he gets around ok at home (does not use walker or cane)  NUTRITION - FOCUSED PHYSICAL EXAM:    Most Recent Value  Orbital Region  Mild depletion  Upper Arm Region  Severe depletion  Thoracic and Lumbar Region  Moderate depletion  Buccal Region  Mild depletion  Temple Region  Moderate depletion  Clavicle Bone Region  Severe depletion  Clavicle and Acromion Bone Region  Severe depletion  Scapular Bone Region  Severe depletion  Dorsal Hand  Mild depletion  Patellar Region  Mild depletion  Anterior Thigh Region  Mild depletion  Posterior Calf Region  Mild depletion  Edema (RD Assessment)  None       Diet Order:   Diet Order            Diet Heart Room service appropriate? Yes; Fluid consistency: Thin  Diet effective now              EDUCATION NEEDS:   Education needs have been addressed  Skin:  Skin Assessment: Reviewed RN Assessment  Last BM:  9/4  Height:   Ht Readings from Last 1 Encounters:  07/10/18 6' (1.829 m)    Weight:   Wt Readings from Last 1 Encounters:  07/10/18 77.8 kg    Ideal Body Weight:     BMI:  Body  mass index is 23.26 kg/m.  Estimated Nutritional Needs:   Kcal:  1950-2150 kcals   Protein:  98-108 g  Fluid:  >/= 1.5 L    Kerman Passey MS, RD, LDN, CNSC [phone] Pager  [phone] Weekend/On-Call Pager

## 2018-07-10 NOTE — Progress Notes (Signed)
ANTICOAGULATION CONSULT NOTE - Initial Consult  Pharmacy Consult for warfarin Indication: atrial fibrillation, LVAD  Allergies  Allergen Reactions  . Demerol [Meperidine] Other (See Comments)    Paralysis/ Could only move eyes    Patient Measurements: Height: (!) 9\' 7"  (292.1 cm) Weight: 171 lb 8.3 oz (77.8 kg) IBW/kg (Calculated) : 176.5 Vital Signs: Temp: 97.6 F (36.4 C) (09/05 0801) Temp Source: Oral (09/05 0801) BP: 117/101 (09/05 0801) Pulse Rate: 75 (09/05 0800)  Labs: Recent Labs    07/08/18 07/09/18 1818 07/10/18 0300  HGB  --  11.4* 9.9*  HCT  --  36.3* 30.9*  PLT  --  172 129*  LABPROT  --   --  25.8*  INR 1.9*  --  2.38  CREATININE  --  1.52* 1.48*    Estimated Creatinine Clearance: 45.3 mL/min (A) (by C-G formula based on SCr of 1.48 mg/dL (H)).   Medical History: Past Medical History:  Diagnosis Date  . Arthritis   . Automatic implantable cardioverter-defibrillator in situ    a. s/p Medtronic Evera device serial [id number]    . Bradycardia   . CAD (coronary artery disease)    a. s/p MI 1982;  s/p DES to CFX 2011;  s/p NSTEMI 4/12 in setting of VTach;  cath 7/12:  LM ok, LAD mid 30-40%, Dx's with 40%, mCFX stent patent, prox to mid RCA occluded with R to R and L to R collats.  Medical Tx was continued  . Chronic systolic heart failure ([place])    a. s/p LVAD 10/2013 for destination therapy  . CKD (chronic kidney disease)   . CVD (cerebrovascular disease)    a. s/p L CEA 2008  . Cytopenia   . Dysrhythmia    AFIB  . History of GI bleed    a. on ASA- started on plavix during 10/2015 admission for CVA  . HLD (hyperlipidemia)   . HTN (hypertension)   . Hypothyroidism   . Inappropriate therapy from implantable cardioverter-defibrillator    a. ATP for sinus tach SVT wavelet identified SVT but was no  passive (2014)  . Ischemic cardiomyopathy   . 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 L Ear   . Myocardial infarction ([place])    1992  . PAF (paroxysmal atrial  fibrillation) ([place])   . PVD (peripheral vascular disease) ([place])    a. 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 Shortness of breath dyspnea    W/ EXERTION   . Sleep apnea    NO CPAP NOW  HE SENT IT BACK YRS AGO  . Stroke [facility])    a. 10/2015 admission   . Ventricular tachycardia ([place])    a. s/p AICD;   h/o ICD shock;  Amiodarone Rx. S/P ablation in 2012   Assessment: 77 year old male with LVAD well known to pharmacy. Patient found to be having low flow alarms and VT transmitted on ICD, instructed by MD to come to ED for evaluation. In ED patient was still in VT and was cardioverted. Home warfarin regimen is 2mg  daily except 4mg  on Tuesdays  Patient on warfarin for his afib and LVAD. INR within range at 2.38 s/p warfarin 2mg  last night. No signs/symptoms of bleeding reported by nursing.   Goal of Therapy:  INR goal 2-2.5 Monitor platelets by anticoagulation protocol: Yes   Plan:  Warfarin 2mg  tonight Daily INR   Claiborne Billings, PharmD PGY2 Cardiology Pharmacy Resident Phone [phone] 07/10/2018 9:01 AM

## 2018-07-10 NOTE — Consult Note (Addendum)
Cardiology Consultation:   Patient ID: [name]; [id number]; 15-Oct-1941   Admit date: 07/09/2018 Date of Consult: 07/10/2018  Primary Care Provider: Jolaine Artist, MD Primary Cardiologist: No primary care provider on file.  Primary Electrophysiologist:  Dr. Caryl Comes   Patient Profile:   [name] is a 77 y.o. male with a hx of VT, PAD 01/2016 R CEA, and severe systolic HF (EF 16-38%) due to mixed ischemic/nonischemic CM he is s/p HM II LVAD implant for destination therapy on October 19, 2013.VAD implantation was complicated by VT, syncope, AF/Aflutter and RHF who is being seen today for the evaluation of VT at the request of Dr. Haroldine Laws.  VT events 9/17 with high fevers and productive cough. Treated with levaquin x 7 days for acute bronchitis. Also found to have VT and underwent DC-CV. Seen by EP and amio increased 10/17 speed turned down to 8800 due to RV failure. 10/18 with slow VT and ADHF.Cardioverted in ED and amio uptitrated.  04/01/18 recurrent VT. Underwent DC-CV in ER 06/10/18 AFib underwent DC-CV 07/02/18 AFL underwent DC-CV 07/10/18 VT emergently cardioverted in ER  AAD: amiodarone  Device information: MDT dual chamber ICD, implanted 07/05/15  Dr. Caryl Comes notes: He underwent device revision with insertion of an atrial lead and generator replacement 7/14  History of Present Illness:   [name] developed weakness, dizziness, despite PO fluids/hydration at home persisted, remote device transmission noted he was in VT, referred to the ER, he arrived last evening and was found in VT 160's, hypotensiove treated with 1L IVF and two doses of neosynephrine with improvement in MAPs to 90-100, sedayed and emergently cardioverted in the ER, AHF, Dr. Haroldine Laws with hypotension and a number of low-flow alarms on his LVAD.  The patient today feels "much better", with no active complaints  LLABS K+ 4.0 BUN/Creat 36/1.48 WBC 5.1 H/H 9.9/30.9 Plts 129 INR 2.39 TSH  8.910   Past Medical History:  Diagnosis Date  . Arthritis   . Automatic implantable cardioverter-defibrillator in situ    a. s/p Medtronic Evera device serial [id number]    . Bradycardia   . CAD (coronary artery disease)    a. s/p MI 1982;  s/p DES to CFX 2011;  s/p NSTEMI 4/12 in setting of VTach;  cath 7/12:  LM ok, LAD mid 30-40%, Dx's with 40%, mCFX stent patent, prox to mid RCA occluded with R to R and L to R collats.  Medical Tx was continued  . Chronic systolic heart failure ([place])    a. s/p LVAD 10/2013 for destination therapy  . CKD (chronic kidney disease)   . CVD (cerebrovascular disease)    a. s/p L CEA 2008  . Cytopenia   . Dysrhythmia    AFIB  . History of GI bleed    a. on ASA- started on plavix during 10/2015 admission for CVA  . HLD (hyperlipidemia)   . HTN (hypertension)   . Hypothyroidism   . Inappropriate therapy from implantable cardioverter-defibrillator    a. ATP for sinus tach SVT wavelet identified SVT but was no  passive (2014)  . Ischemic cardiomyopathy   . 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 L Ear   . Myocardial infarction ([place])    1992  . PAF (paroxysmal atrial fibrillation) ([place])   . PVD (peripheral vascular disease) ([place])    a. h/o claudication;  s/p R fem to pop BPG 3/11;  s/p L CFA BPG 7/03;  s/p repair of inf AAA 6/03;  s/p  aorta to bilat renal BPG 6/03  . Shortness of breath dyspnea    W/ EXERTION   . Sleep apnea    NO CPAP NOW  HE SENT IT BACK YRS AGO  . Stroke [facility])    a. 10/2015 admission   . Ventricular tachycardia ([place])    a. s/p AICD;   h/o ICD shock;  Amiodarone Rx. S/P ablation in 2012    Past Surgical History:  Procedure Laterality Date  . BACK SURGERY    . CARDIAC CATHETERIZATION     "several" (05/25/2013)  . CARDIAC DEFIBRILLATOR PLACEMENT  2003; 2005; 05/25/2013   2014: Medtronic Evera device serial number [id number]  . CARDIAC ELECTROPHYSIOLOGY STUDY AND ABLATION  2012  . CARDIOVERSION N/A 10/15/2013   Procedure:  CARDIOVERSION;  Surgeon: Jolaine Artist, MD;  Location: [place];  Service: Cardiovascular;  Laterality: N/A;  . CARDIOVERSION N/A 11/04/2013   Procedure: CARDIOVERSION;  Surgeon: Larey Dresser, MD;  Location: [place];  Service: Cardiovascular;  Laterality: N/A;  . CARDIOVERSION N/A 06/04/2014   Procedure: CARDIOVERSION;  Surgeon: Jolaine Artist, MD;  Location: [facility] ENDOSCOPY;  Service: Cardiovascular;  Laterality: N/A;  . CARDIOVERSION N/A 03/02/2015   Procedure: CARDIOVERSION;  Surgeon: Jolaine Artist, MD;  Location: [facility] ENDOSCOPY;  Service: Cardiovascular;  Laterality: N/A;  . CARDIOVERSION N/A 01/19/2016   Procedure: CARDIOVERSION;  Surgeon: Larey Dresser, MD;  Location: [place];  Service: Cardiovascular;  Laterality: N/A;  . CARDIOVERSION N/A 06/10/2018   Procedure: CARDIOVERSION;  Surgeon: Jolaine Artist, MD;  Location: [facility] ENDOSCOPY;  Service: Cardiovascular;  Laterality: N/A;  . CARDIOVERSION N/A 07/02/2018   Procedure: CARDIOVERSION;  Surgeon: Jolaine Artist, MD;  Location: [place];  Service: Cardiovascular;  Laterality: N/A;  . CAROTID ENDARTERECTOMY Left ~ 2007  . CATARACT EXTRACTION W/ INTRAOCULAR LENS  IMPLANT, BILATERAL Bilateral 2000  . CHOLECYSTECTOMY  12/15/?2010  . COLONOSCOPY WITH PROPOFOL N/A 2020-12-1916   Procedure: COLONOSCOPY WITH PROPOFOL;  Surgeon: Ronald Lobo, MD;  Location: [place];  Service: Endoscopy;  Laterality: N/A;  . CORONARY ANGIOPLASTY  1992  . CORONARY ANGIOPLASTY WITH STENT PLACEMENT     "last one was 11/2012  (05/25/2013)  . CYSTOSCOPY W/ RETROGRADES Bilateral 05/16/2018   Procedure: CYSTOSCOPY WITH RETROGRADE PYELOGRAM;  Surgeon: Lucas Mallow, MD;  Location: [place];  Service: Urology;  Laterality: Bilateral;  . CYSTOSCOPY WITH RETROGRADE PYELOGRAM, URETEROSCOPY AND STENT PLACEMENT Left 08/09/2014   Procedure: CYSTOSCOPY WITH RETROGRADE PYELOGRAM, URETEROSCOPY AND STENT PLACEMENT;  Surgeon: Bernestine Amass, MD;   Location: [facility];  Service: Urology;  Laterality: Left;  . CYSTOSCOPY WITH RETROGRADE PYELOGRAM, URETEROSCOPY AND STENT PLACEMENT Left 09/01/2014   Procedure: CYSTOSCOPY WITH URETEROSCOPY AND STENT REMOVAL;  Surgeon: Bernestine Amass, MD;  Location: [facility];  Service: Urology;  Laterality: Left;  . CYSTOSCOPY WITH RETROGRADE PYELOGRAM, URETEROSCOPY AND STENT PLACEMENT Left 09/20/2014   Procedure: CYSTOSCOPY WITH RETROGRADE PYELOGRAM, URETEROSCOPY AND STENT PLACEMENT, stone removal;  Surgeon: Bernestine Amass, MD;  Location: [facility];  Service: Urology;  Laterality: Left;  . CYSTOSCOPY WITH RETROGRADE PYELOGRAM, URETEROSCOPY AND STENT PLACEMENT Bilateral 12/04/2017   Procedure: CYSTOSCOPY WITH BILATERAL RETROGRADE PYELOGRAM, LEFT URETEROSCOPY AND STENT PLACEMENT;  Surgeon: Lucas Mallow, MD;  Location: [place];  Service: Urology;  Laterality: Bilateral;  . DOPPLER ECHOCARDIOGRAPHY  2011  . ELBOW ARTHROSCOPY Right 1990's  . ELECTROPHYSIOLOGIC STUDY N/A 07/19/2016   Procedure: Cardioversion;  Surgeon: Evans Lance, MD;  Location: [place] CV LAB;  Service:  Cardiovascular;  Laterality: N/A;  . ENDARTERECTOMY Right 01/13/2016   Procedure: RIGHT CAROTID ARTERY ENDARTERECTOMY;  Surgeon: Mal Misty, MD;  Location: [place];  Service: Vascular;  Laterality: Right;  . ENTEROSCOPY Left 02/23/2017   Procedure: ENTEROSCOPY;  Surgeon: Ronald Lobo, MD;  Location: [facility] ENDOSCOPY;  Service: Endoscopy;  Laterality: Left;  . ENTEROSCOPY N/A 10/17/2017   Procedure: ENTEROSCOPY;  Surgeon: Yetta Flock, MD;  Location: [facility] ENDOSCOPY;  Service: Gastroenterology;  Laterality: N/A;  LVAD pt.    . ESOPHAGOGASTRODUODENOSCOPY N/A 02/15/2015   Procedure: ESOPHAGOGASTRODUODENOSCOPY (EGD);  Surgeon: Carol Ada, MD;  Location: [facility] ENDOSCOPY;  Service: Endoscopy;  Laterality: N/A;  LVAD  . EYE SURGERY     VITRECTOMY  LEFT 05/2012  . FEMORAL-POPLITEAL BYPASS GRAFT Right 2011  . FEMORAL-POPLITEAL BYPASS GRAFT Left 05/2002    Archie Endo 05/06/2002 (05/25/2013)  . HEEL SPUR SURGERY Right 1990's  . HOLMIUM LASER APPLICATION Left 89/21/1941   Procedure: HOLMIUM LASER APPLICATION;  Surgeon: Bernestine Amass, MD;  [facility];  Service: Urology;  Laterality: Left;  . HOLMIUM LASER APPLICATION Left 7/40/8144   Procedure: HOLMIUM LASER APPLICATION;  Surgeon: Lucas Mallow, MD;  Location: [place];  Service: Urology;  Laterality: Left;  . IMPLANTABLE CARDIOVERTER DEFIBRILLATOR GENERATOR CHANGE N/A 05/25/2013   Procedure: IMPLANTABLE CARDIOVERTER DEFIBRILLATOR GENERATOR CHANGE;  Surgeon: Deboraha Sprang, MD;  Location: [facility] CATH LAB;  Service: Cardiovascular;  Laterality: N/A;  . INSERTION OF IMPLANTABLE LEFT VENTRICULAR ASSIST DEVICE N/A 10/19/2013   Procedure: INSERTION OF IMPLANTABLE LEFT VENTRICULAR ASSIST DEVICE;  Surgeon: Gaye Pollack, MD;  Location: [place];  Service: Open Heart Surgery;  Laterality: N/A;  CIRC ARREST  NITRIC OXIDE  MEDTRONIC ICD  . INTRAOPERATIVE TRANSESOPHAGEAL ECHOCARDIOGRAM N/A 10/19/2013   Procedure: INTRAOPERATIVE TRANSESOPHAGEAL ECHOCARDIOGRAM;  Surgeon: Gaye Pollack, MD;  Location: [facility] OR;  Service: Open Heart Surgery;  Laterality: N/A;  . IR FLUORO GUIDE CV LINE RIGHT  12/10/2017  . IR US GUIDE VASC ACCESS RIGHT  12/10/2017  . KNEE ARTHROSCOPY Bilateral 1990's  . LEAD REVISION N/A 06/05/2013   Procedure: LEAD REVISION;  Surgeon: Thompson Grayer, MD;  Location: [facility] CATH LAB;  Service: Cardiovascular;  Laterality: N/A;  . LEFT HEART CATHETERIZATION WITH CORONARY ANGIOGRAM N/A 11/24/2012   Procedure: LEFT HEART CATHETERIZATION WITH CORONARY ANGIOGRAM;  Surgeon: Peter M Martinique, MD;  Location: [facility] CATH LAB;  Service: Cardiovascular;  Laterality: N/A;  . LIPOMA EXCISION Left 2013   "near ear" (05/25/2013)  . [place]; 2000's  . PATCH ANGIOPLASTY Right 01/13/2016   Procedure: WITH  PATCH ANGIOPLASTY;  Surgeon: Mal Misty, MD;  Location: [place];  Service: Vascular;  Laterality: Right;  . RENAL  ARTERY BYPASS Bilateral 2003  . REVASCULARIZATION / IN-SITU GRAFT LEG    . RIGHT HEART CATH N/A 11/29/2017   Procedure: RIGHT HEART CATH;  Surgeon: Jolaine Artist, MD;  Location: [place] CV LAB;  Service: Cardiovascular;  Laterality: N/A;  . RIGHT HEART CATHETERIZATION N/A 09/17/2013   Procedure: RIGHT HEART CATH;  Surgeon: Jolaine Artist, MD;  Location: [facility] CATH LAB;  Service: Cardiovascular;  Laterality: N/A;  . RIGHT HEART CATHETERIZATION N/A 10/14/2013   Procedure: RIGHT HEART CATH;  Surgeon: Jolaine Artist, MD;  Location: [facility] CATH LAB;  Service: Cardiovascular;  Laterality: N/A;  . RIGHT HEART CATHETERIZATION N/A 11/16/2013   Procedure: RIGHT HEART CATH;  Surgeon: Jolaine Artist, MD;  Location: [facility] CATH LAB;  Service: Cardiovascular;  Laterality: N/A;  .  RIGHT HEART CATHETERIZATION N/A 07/13/2014   Procedure: RIGHT HEART CATH;  Surgeon: Jolaine Artist, MD;  Location: [facility] CATH LAB;  Service: Cardiovascular;  Laterality: N/A;  . TEE WITHOUT CARDIOVERSION N/A 11/04/2013   Procedure: TRANSESOPHAGEAL ECHOCARDIOGRAM (TEE);  Surgeon: Larey Dresser, MD;  Location: [place];  Service: Cardiovascular;  Laterality: N/A;  . TEE WITHOUT CARDIOVERSION N/A 03/02/2015   Procedure: TRANSESOPHAGEAL ECHOCARDIOGRAM (TEE);  Surgeon: Jolaine Artist, MD;  Location: [facility] ENDOSCOPY;  Service: Cardiovascular;  Laterality: N/A;  . TEE WITHOUT CARDIOVERSION N/A 01/19/2016   Procedure: TRANSESOPHAGEAL ECHOCARDIOGRAM (TEE);  Surgeon: Larey Dresser, MD;  Location: [place];  Service: Cardiovascular;  Laterality: N/A;  . TONSILLECTOMY  1946  . TRANSURETHRAL RESECTION OF BLADDER TUMOR N/A 12/04/2017   Procedure: TRANSURETHRAL RESECTION OF BLADDER TUMOR (TURBT);  Surgeon: Lucas Mallow, MD;  Location: [place];  Service: Urology;  Laterality: N/A;  . TRANSURETHRAL RESECTION OF BLADDER TUMOR N/A 05/16/2018   Procedure: TRANSURETHRAL RESECTION OF BLADDER TUMOR (TURBT);  Surgeon: Lucas Mallow, MD;  Location: [place];  Service: Urology;  Laterality: N/A;  . TUMOR EXCISION Left 1960's   "fatty tumor" (05/25/2013)  . V-TACH ABLATION N/A 09/12/2011   Procedure: V-TACH ABLATION;  Surgeon: Evans Lance, MD;  Location: [facility] CATH LAB;  Service: Cardiovascular;  Laterality: N/A;  . V-TACH ABLATION N/A 06/08/2013   Procedure: V-TACH ABLATION;  Surgeon: Evans Lance, MD;  Location: [facility] CATH LAB;  Service: Cardiovascular;  Laterality: N/A;     Home Medications:  Prior to Admission medications   Medication Sig Start Date End Date Taking? Authorizing Provider  amiodarone (PACERONE) 200 MG tablet Take 200 mg by mouth 2 (two) times daily.   Yes [provider]  atorvastatin (LIPITOR) 80 MG tablet TAKE 1 TABLET DAILY AT 6 PM Patient taking differently: Take 80 mg by mouth at bedtime.  07/22/17  Yes Bensimhon, Shaune Pascal, MD  docusate sodium (COLACE) 100 MG capsule Take 200 mg by mouth daily.    Yes [provider]  furosemide (LASIX) 20 MG tablet Take 20 mg by mouth daily as needed for fluid.   Yes [provider]  levothyroxine (SYNTHROID, LEVOTHROID) 150 MCG tablet Take 1 tablet (150 mcg total) by mouth daily before breakfast. 10/25/17  Yes Bensimhon, Shaune Pascal, MD  Multiple Vitamins-Minerals (ICAPS PO) Take 1 tablet by mouth 2 (two) times daily.   Yes [provider]  Multiple Vitamins-Minerals (MULTIVITAMIN PO) Take 1 tablet by mouth daily.   Yes [provider]  pantoprazole (PROTONIX) 40 MG tablet TAKE 1 TABLET TWICE DAILY Patient taking differently: Take 40 mg by mouth 2 (two) times daily before a meal.  05/05/18  Yes Bensimhon, Shaune Pascal, MD  promethazine (PHENERGAN) 12.5 MG tablet Take 12.5 mg by mouth every 6 (six) hours as needed for nausea or vomiting.   Yes [provider]  ranolazine (RANEXA) 500 MG 12 hr tablet Take 1 tablet (500 mg total) by mouth 2 (two) times daily. 01/27/18  Yes Bensimhon, Shaune Pascal, MD  sildenafil (REVATIO) 20 MG  tablet Take 1 tablet (20 mg total) by mouth 3 (three) times daily. 04/21/18  Yes Bensimhon, Shaune Pascal, MD  temazepam (RESTORIL) 30 MG capsule Take 1 capsule (30 mg total) by mouth at bedtime as needed for sleep. 06/30/18  Yes Bensimhon, Shaune Pascal, MD  warfarin (COUMADIN) 2 MG tablet Take 2 mg by mouth See admin instructions. Take 1 tablet (2mg ) Wednesday-Monday evening and take 2  tablets (4mg ) on Tuesday evenings   Yes [provider]  zinc gluconate 50 MG tablet Take 50 mg by mouth 2 (two) times daily.    Yes [provider]    Inpatient Medications: Scheduled Meds: . atorvastatin  80 mg Oral q1800  . docusate sodium  200 mg Oral Daily  . feeding supplement (ENSURE ENLIVE)  237 mL Oral BID BM  . levothyroxine  150 mcg Oral QAC breakfast  . pantoprazole  40 mg Oral BID AC  . ranolazine  500 mg Oral BID  . sildenafil  20 mg Oral TID  . warfarin  2 mg Oral ONCE-1800  . Warfarin - Pharmacist Dosing Inpatient   Does not apply q1800   Continuous Infusions: . sodium chloride 10 mL/hr at 07/10/18 1100  . amiodarone 30 mg/hr (07/10/18 0600)   PRN Meds: sodium chloride, acetaminophen, furosemide, ondansetron (ZOFRAN) IV, promethazine, temazepam  Allergies:    Allergies  Allergen Reactions  . Demerol [Meperidine] Other (See Comments)    Paralysis/ Could only move eyes    Social History:   Social History   Socioeconomic History  . Marital status: Divorced    Spouse name: Not on file  . Number of children: 2  . Years of education: Not on file  . Highest education level: Not on file  Occupational History  . Occupation: retired    Comment: Health visitor  . Occupation: Retired    Comment: Security [employer]  . Occupation: Part-time    Comment: Security at [employer]  . Financial resource strain: Not on file  . Food insecurity:    Worry: Not on file    Inability: Not on file  . Transportation needs:    Medical: Not on file    Non-medical: Not on  file  Tobacco Use  . Smoking status: Former Smoker    Packs/day: 0.50    Years: 37.00    Pack years: 18.50    Types: Cigarettes    Last attempt to quit: 11/05/2001    Years since quitting: 16.6  . Smokeless tobacco: Never Used  Substance and Sexual Activity  . Alcohol use: Yes    Alcohol/week: 0.0 standard drinks    Comment: 05/25/2013 "mixed drink couple times/month"  . Drug use: No  . Sexual activity: Never  Lifestyle  . Physical activity:    Days per week: Not on file    Minutes per session: Not on file  . Stress: Not on file  Relationships  . Social connections:    Talks on phone: Not on file    Gets together: Not on file    Attends religious service: Not on file    Active member of club or organization: Not on file    Attends meetings of clubs or organizations: Not on file    Relationship status: Not on file  . Intimate partner violence:    Fear of current or ex partner: Not on file    Emotionally abused: Not on file    Physically abused: Not on file    Forced sexual activity: Not on file  Other Topics Concern  . Not on file  Social History Narrative   Lives with sig other.    Family History:   Family History  Problem Relation Age of Onset  . Heart attack Mother   . Coronary artery disease Mother   . Cancer Mother   . Heart disease Mother   . Hyperlipidemia Mother   . Hypertension  Mother   . Coronary artery disease Father   . Stroke Father   . Cancer Father   . Heart disease Father        before age 66  . Hyperlipidemia Father   . Hypertension Father   . Heart attack Father   . Coronary artery disease Unknown      ROS:  Please see the history of present illness.  All other ROS reviewed and negative.     Physical Exam/Data:   Vitals:   07/10/18 0900 07/10/18 1000 07/10/18 1100 07/10/18 1153  BP: (!) 119/102 (!) 109/96 (!) 111/94 (!) 111/94  Pulse: 75 74 74   Resp: 15 17 15    Temp:    98 F (36.7 C)  TempSrc:    Oral  SpO2: 96% 95% 93%     Weight:      Height:        Intake/Output Summary (Last 24 hours) at 07/10/2018 1205 Last data filed at 07/10/2018 1100 Gross per 24 hour  Intake 692.79 ml  Output 1200 ml  Net -507.21 ml   Filed Weights   07/09/18 1923 07/10/18 0500  Weight: 78.5 kg 77.8 kg   Body mass index is 9.12 kg/m.  General:  Well nourished, well developed, in no acute distress HEENT: normal Lymph: no adenopathy Neck: no JVD Endocrine:  No thryomegaly Vascular: No carotid bruits Cardiac:  vad hum Lungs:  CTA b/l, no wheezing, rhonchi or rales  Abd: soft, nontender  Ext: no edema Musculoskeletal:  No deformities, Skin: warm and dry  Neuro:  no focal abnormalities noted Psych:  Normal affect   EKG:  The EKG was personally reviewed and demonstrates:   Unable to locate EKG from this admission Telemetry:  Telemetry was personally reviewed and demonstrates:   Looks AV pacing  Relevant CV Studies:  11/25/17: TTE Study Conclusions - Left ventricle: The cavity size was mildly dilated. Wall   thickness was normal. - Aorta: Aortic root is mildly dilated at 41 mm - Left atrium: The atrium was severely dilated. - Right ventricle: The cavity size was mildly dilated. Systolic   function was moderately to severely reduced. - Right atrium: The atrium was mildly dilated.  Laboratory Data:  Chemistry Recent Labs  Lab 07/09/18 1818 07/10/18 0300  NA 136 134*  K 4.3 4.0  CL 101 104  CO2 21* 22  GLUCOSE 105* 110*  BUN 43* 36*  CREATININE 1.52* 1.48*  CALCIUM 9.5 8.4*  GFRNONAA 42* 44*  GFRAA 49* 50*  ANIONGAP 14 8    Recent Labs  Lab 07/09/18 1818  PROT 8.0  ALBUMIN 3.7  AST 61*  ALT 41  ALKPHOS 190*  BILITOT 0.7   Hematology Recent Labs  Lab 07/09/18 1818 07/10/18 0300  WBC 5.6 5.1  RBC 3.88* 3.38*  HGB 11.4* 9.9*  HCT 36.3* 30.9*  MCV 93.6 91.4  MCH 29.4 29.3  MCHC 31.4 32.0  RDW 22.5* 22.1*  PLT 172 129*   Cardiac EnzymesNo results for input(s): TROPONINI in the last 168  hours. No results for input(s): TROPIPOC in the last 168 hours.  BNPNo results for input(s): BNP, PROBNP in the last 168 hours.  DDimer No results for input(s): DDIMER in the last 168 hours.  Radiology/Studies:  No results found.  Assessment and Plan:   1. Recurrent VT     Poorly tolerated with hypotension, weakness     Emergent DCCV in ER >>> amio gtt, also on ranexa 500mg  BID  He has hx of amio-induced hyperthyroidism, though restarted 2/2 recurrent VT, home dose is 200mg  BID  Looks like he has had a VT ablation historically 2012 2/2 multiple shocks and again in 2014  Dr. Curt Bears has seen and examined the patient Clif Serio get ICD interrogated to evaluate cycle length of his VT and current programming to see if any adjustments in ATP can be made. Shadman Tozzi add mexilletine       For questions or updates, please contact [place] Please consult [email or web address] for contact info under Cardiology/STEMI.   Signed, Baldwin Jamaica, PA-C  07/10/2018 12:05 PM  I have seen and examined this patient with Tommye Standard.  Agree with above, note added to reflect my findings.  On exam, RRR, no murmurs, lungs clear. Presented to the hosptial in [place] requiring cardioversion in the ER. Had fatigue and SOB. He is currently on an amiodarone ggt and was on amiodarone prior to admission. He Bentzion Dauria likely need continued amiodarone but this does not seem to be controlling his arrhythmias. Glendora Clouatre thus add Mexiletine. Davionne Dowty also interrogate his ICD. Unfortunately, there is no ECG of his VT. He may need more aggressive ATP programmed.    Ivionna Verley M. Katherene Dinino MD 07/10/2018 12:44 PM

## 2018-07-10 NOTE — Progress Notes (Addendum)
Advanced Heart Failure VAD Team Note  PCP-Cardiologist: No primary care provider on file.   Subjective:    Presented to [facility] 07/09/18 with sustained VT in 160s with multiple flow alarms and low MAPs. Given 1L IVF and two doses of neosynephrine with improvement in MAPs. Sedated by Dr. Johnney Killian in ER and emergently cardioverted to NSR.   Feeling good this am. Had low BPs overnight (states one MAP was in 50s), but has mostly been in 70-90s. Past two checks have been > 100. No further VT. No further low flow. EP has not been by yet.   Denies CP, SOB or lightheadedness. Remains on amio gtt.  Cr 1.48. K 4.0. Hgb 9.9.  Mg 2.2 07/09/18  LVAD Interrogation HM 3: Speed: 8800 Flow: 3.1 PI: 7.3 Power: 4.0. No low flows s/p DCCV.   Objective:    Vital Signs:   Temp:  [97.8 F (36.6 C)-98.3 F (36.8 C)] 98.3 F (36.8 C) (09/05 0010) Pulse Rate:  [73-168] 73 (09/05 0600) Resp:  [13-25] 15 (09/05 0600) BP: (69-122)/(50-104) 97/82 (09/05 0600) SpO2:  [95 %-100 %] 96 % (09/05 0600) Weight:  [77.8 kg-78.5 kg] 77.8 kg (09/05 0500) Last BM Date: 07/09/18 Mean arterial Pressure 90-100s this am.   Intake/Output:   Intake/Output Summary (Last 24 hours) at 07/10/2018 0800 Last data filed at 07/10/2018 0600 Gross per 24 hour  Intake 319.31 ml  Output 1200 ml  Net -880.69 ml     Physical Exam    General:  Sitting up in bed. NAD.  HEENT: Normal anicteric Neck: Supple. JVP ~ 7-8 cm. Carotids 2+ bilat; no bruits. No lymphadenopathy or thyromegaly appreciated. Cor: Mechanical heart sounds with LVAD hum present. Lungs: Clear no wheeze Abdomen: Soft, nontender, nondistended. No hepatosplenomegaly. No bruits or masses. Good bowel sounds. Driveline: C/D/I; securement device intact and driveline incorporated Extremities: no cyanosis, clubbing, rash, edema Neuro: alert & oriented x 3, cranial nerves grossly intact. moves all 4 extremities w/o difficulty. Affect pleasant   Telemetry   NSR 70s,  personally reviewed.  EKG    No new tracings.  Labs   Basic Metabolic Panel: Recent Labs  Lab 07/09/18 1818 07/10/18 0300  NA 136 134*  K 4.3 4.0  CL 101 104  CO2 21* 22  GLUCOSE 105* 110*  BUN 43* 36*  CREATININE 1.52* 1.48*  CALCIUM 9.5 8.4*  MG 2.2  --     Liver Function Tests: Recent Labs  Lab 07/09/18 1818  AST 61*  ALT 41  ALKPHOS 190*  BILITOT 0.7  PROT 8.0  ALBUMIN 3.7   No results for input(s): LIPASE, AMYLASE in the last 168 hours. No results for input(s): AMMONIA in the last 168 hours.  CBC: Recent Labs  Lab 07/09/18 1818 07/10/18 0300  WBC 5.6 5.1  NEUTROABS 3.6  --   HGB 11.4* 9.9*  HCT 36.3* 30.9*  MCV 93.6 91.4  PLT 172 129*    INR: Recent Labs  Lab 07/08/18 07/10/18 0300  INR 1.9* 2.38    Other results:  EKG:    Imaging    No results found.   Medications:     Scheduled Medications: . atorvastatin  80 mg Oral q1800  . docusate sodium  200 mg Oral Daily  . feeding supplement (ENSURE ENLIVE)  237 mL Oral BID BM  . levothyroxine  150 mcg Oral QAC breakfast  . pantoprazole  40 mg Oral BID AC  . ranolazine  500 mg Oral BID  . sildenafil  20  mg Oral TID  . Warfarin - Pharmacist Dosing Inpatient   Does not apply q1800     Infusions: . sodium chloride 10 mL/hr at 07/10/18 0600  . amiodarone 30 mg/hr (07/10/18 0600)     PRN Medications:  sodium chloride, acetaminophen, furosemide, ondansetron (ZOFRAN) IV, promethazine, temazepam   Patient Profile   [name] is a 77 y.o. 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 Required short term milrinone.   Presented to [facility] 07/09/18 with sustained VT in 160s with multiple flow alarms and low MAPs. Given 1L IVF and two doses of neosynephrine with improvement in MAPs. Sedated by  Dr. Johnney Killian in ER and emergently cardioverted to NSR.   Assessment/Plan:    1. Recurrent VTwith hypotension and low flow alarms on VAD - Underwent DC-CV 9/17, 08/17/17, and 04/01/18 - s/p emergent DC-CV in  ER 07/09/18  -Continue amiodarone gtt for now.  - Keep K > 4.0 Mg > 2.0 - EP to see this am. 2. Chronic systolic HF: s/p HM-II LVAD 10/2014.  - Recent RHC in 1/19 with mildly low output. Started on milrinone without benefitso it was stopped.  -Volume status looks OK today. Follow closely with IVF for low flows.  - s/p previous driveline [id number]. No further pump stops - VAD interrogated personally. Parameters stable now in NSR.  - MAPs running higher today. Will follow for now as historically stable/low and not on chronic anti-hypertensives. 3. 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  - Had 2 BCG infusions with Dr. Gloriann Loan but discontinued due to inability to tolerate - S/p repeat resection 7/19. Stable.  4. CKD, stage III:  - Had ESRD in setting of obstructive uropathy and ATN. Required HD x1(11/2017) -Cr 1.48 this am.  5. 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 Continue coumadin and plavix.(with h/o CVA). - Admitted 4/18 with recurrent GIB. Transfused 2u. EGD/colon negative - Admitted 12./18. hgb 6.5, Transfused 5u. EGD with 2 AVMs treated with APC - Hadbeen getting octreotide infusions but too expensive for him. Willcontinue to hold for now - No overt bleeding. Hgb 9.9 this am.  6. Atrial arrhythmias: Atrial fibrillation, atrial tachycardia.  - s/p multiple DC-CVs last 07/02/18  - Continue amiodarone.  7. HTN:  -BP low on arrival. Now improved 8. LVAD:  - VAD interrogation as above.  - LDH 216. 9. Anticoagulation management:  - Goal INR 2-2.5 - INR 2.38. dosing per Pharm D.  10. Lumbar compression fracture: - S/p L3-L5 fusion 02/03/16. - Stable 11. 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  -Continue atorvastatin. (andwarfarin INR goal 2-2.5). S/p R CEA 01/13/16.  - Plavix stopped 12/18 due to recurrent GIB. Stable.  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 Forde Dandy in past.  - Amio restarted due to recurrent atrial arrhythmias and VT.  - TSH 8.91 (Elevated). FT2 1.04 (WNL) after adjustement to synthroid in 12/18  13. Insomnia -Continue temazepam 30 qhs prn.  I reviewed the LVAD parameters from today, and compared the results to the patient's prior recorded data.  No programming changes were made.  The LVAD is functioning within specified parameters.  The patient performs LVAD self-test daily.  LVAD interrogation was negative for any  significant power changes, alarms or PI events/speed drops since cardioversion 07/09/18. LVAD equipment check completed and is in good working order.  Back-up equipment present.   LVAD education done on emergency procedures and precautions and reviewed exit site care.   Length of Stay: 1  Annamaria Helling 07/10/2018, 8:00 AM   VAD Team --- VAD ISSUES ONLY--- Pager [phone] (7am - 7am)  Advanced Heart Failure Team  Pager [phone] (M-F; 7a - 4p)  Please contact [facility] for night-coverage after hours (4p -7a ) and weekends on [email or web address]  Patient seen and examined with the above-signed Advanced Practice Provider and/or Housestaff. I personally reviewed laboratory data, imaging studies and relevant notes. I independently examined the patient and formulated the important aspects of the plan. I have edited the note to reflect any of my changes or salient points. I have personally discussed the plan with the patient and/or family.  77 y/o VAD patient with recurrent VT complicated by low-flow alarms. Now s/p emergent defibrillation in ER. Remains in NSR on IV amio and Ranexa. VAD parameters improved. Creatinine stable. INR 2.38. Discussed dosing with PharmD personally. He has had  multiple episodes of AF/AFL and VT recently despite amio. I have discussed with Dr. Caryl Comes. EP will see formally today to see if additional recs. He has a h/o amio-induced thyroiditis. TSH high but T4 ok today. BPs labile but stabilizing. Will ambulate.   Glori Bickers, MD  12:18 PM

## 2018-07-11 DIAGNOSIS — E43 Unspecified severe protein-calorie malnutrition: Secondary | ICD-10-CM

## 2018-07-11 LAB — CBC
HCT: 30.7 % — ABNORMAL LOW (ref 39.0–52.0)
Hemoglobin: 9.6 g/dL — ABNORMAL LOW (ref 13.0–17.0)
MCH: 28.7 pg (ref 26.0–34.0)
MCHC: 31.3 g/dL (ref 30.0–36.0)
MCV: 91.9 fL (ref 78.0–100.0)
Platelets: 135 10*3/uL — ABNORMAL LOW (ref 150–400)
RBC: 3.34 MIL/uL — AB (ref 4.22–5.81)
RDW: 21.9 % — ABNORMAL HIGH (ref 11.5–15.5)
WBC: 4.7 10*3/uL (ref 4.0–10.5)

## 2018-07-11 LAB — BASIC METABOLIC PANEL
Anion gap: 6 (ref 5–15)
BUN: 28 mg/dL — ABNORMAL HIGH (ref 8–23)
CO2: 26 mmol/L (ref 22–32)
Calcium: 8.7 mg/dL — ABNORMAL LOW (ref 8.9–10.3)
Chloride: 103 mmol/L (ref 98–111)
Creatinine, Ser: 1.36 mg/dL — ABNORMAL HIGH (ref 0.61–1.24)
GFR calc Af Amer: 56 mL/min — ABNORMAL LOW (ref >60)
GFR calc non Af Amer: 48 mL/min — ABNORMAL LOW (ref >60)
Glucose, Bld: 98 mg/dL (ref 70–99)
Potassium: 4.3 mmol/L (ref 3.5–5.1)
Sodium: 135 mmol/L (ref 135–145)

## 2018-07-11 LAB — PROTIME-INR
INR: 2.3
Prothrombin Time: 25.1 seconds — ABNORMAL HIGH (ref 11.4–15.2)

## 2018-07-11 LAB — MAGNESIUM: Magnesium: 2 mg/dL (ref 1.7–2.4)

## 2018-07-11 LAB — LACTATE DEHYDROGENASE: LDH: 215 U/L — AB (ref 98–192)

## 2018-07-11 LAB — T3, FREE: T3, Free: 1.5 pg/mL — ABNORMAL LOW (ref 2.0–4.4)

## 2018-07-11 MED ORDER — SOTALOL HCL 80 MG PO TABS
80.0000 mg | ORAL_TABLET | Freq: Two times a day (BID) | ORAL | Status: AC
  Administered 2018-07-11: 80 mg via ORAL
  Filled 2018-07-11: qty 1

## 2018-07-11 MED ORDER — SOTALOL HCL 120 MG PO TABS
120.0000 mg | ORAL_TABLET | Freq: Two times a day (BID) | ORAL | Status: DC
  Filled 2018-07-11: qty 1

## 2018-07-11 MED ORDER — SOTALOL HCL 80 MG PO TABS
80.0000 mg | ORAL_TABLET | Freq: Two times a day (BID) | ORAL | Status: DC
Start: 2018-07-11 — End: 2018-07-13
  Administered 2018-07-11 – 2018-07-13 (×4): 80 mg via ORAL
  Filled 2018-07-11 (×5): qty 1

## 2018-07-11 MED ORDER — WARFARIN SODIUM 2 MG PO TABS
2.0000 mg | ORAL_TABLET | Freq: Once | ORAL | Status: AC
  Administered 2018-07-11: 2 mg via ORAL
  Filled 2018-07-11: qty 1

## 2018-07-11 MED ORDER — AMIODARONE HCL 200 MG PO TABS
200.0000 mg | ORAL_TABLET | Freq: Two times a day (BID) | ORAL | Status: DC
  Administered 2018-07-11 – 2018-07-14 (×7): 200 mg via ORAL
  Filled 2018-07-11 (×7): qty 1

## 2018-07-11 MED ORDER — SOTALOL HCL 80 MG PO TABS: 80.0000 mg | ORAL_TABLET | Freq: Two times a day (BID) | ORAL | Status: DC

## 2018-07-11 NOTE — Progress Notes (Addendum)
Advanced Heart Failure VAD Team Note  PCP-Cardiologist: No primary care provider on file.   Subjective:    Presented to [facility] 07/09/18 with sustained VT in 160s with multiple flow alarms and low MAPs. Given 1L IVF and two doses of neosynephrine with improvement in MAPs. Sedated by Dr. Johnney Killian in ER and emergently cardioverted to NSR.   Seen by EP 07/10/18 and mexiletine added.   Feeling good overall. No lightheadedness or dizziness. No palpitations or CP. No orthopnea  Cr 1.36. K 4.3. Hgb 9.6. Mg 2.2 07/09/18  LVAD Interrogation HM 2: Speed: 8800 Flow: 3.3 PI: 7.0 Power: 4.0. No PI or low events since NSR  Objective:    Vital Signs:   Temp:  [97.8 F (36.6 C)-98.1 F (36.7 C)] 98.1 F (36.7 C) (09/06 0728) Pulse Rate:  [73-78] 74 (09/06 0700) Resp:  [12-19] 15 (09/06 0700) BP: (74-124)/(60-102) 95/82 (09/06 0728) SpO2:  [92 %-100 %] 96 % (09/06 0700) Weight:  [79.1 kg] 79.1 kg (09/06 0600) Last BM Date: 07/09/18 Mean arterial Pressure 80-90s   Intake/Output:   Intake/Output Summary (Last 24 hours) at 07/11/2018 0806 Last data filed at 07/11/2018 0748 Gross per 24 hour  Intake 614.05 ml  Output 2100 ml  Net -1485.95 ml     Physical Exam    General: Sitting up in bed. NAD.  HEENT: Normal. Neck: Supple, JVP 7-8 cm. Carotids OK.  Cardiac:  Mechanical heart sounds with LVAD hum present.  Lungs:  CTAB, normal effort.  Abdomen:  NT, ND, no HSM. No bruits or masses. +BS  LVAD exit site: Well-healed and incorporated. Dressing dry and intact. No erythema or drainage. Stabilization device present and accurately applied. Driveline dressing changed daily per sterile technique. Extremities:  Warm and dry. No cyanosis, clubbing, rash, or edema.  Neuro:  Alert & oriented x 3. Cranial nerves grossly intact. Moves all 4 extremities w/o difficulty. Affect pleasant     Telemetry   NSR 70s, personally reviewed.   EKG    No new tracings.    Labs   Basic Metabolic Panel: Recent  Labs  Lab 07/09/18 1818 07/10/18 0300 07/11/18 0430  NA 136 134* 135  K 4.3 4.0 4.3  CL 101 104 103  CO2 21* 22 26  GLUCOSE 105* 110* 98  BUN 43* 36* 28*  CREATININE 1.52* 1.48* 1.36*  CALCIUM 9.5 8.4* 8.7*  MG 2.2  --   --     Liver Function Tests: Recent Labs  Lab 07/09/18 1818  AST 61*  ALT 41  ALKPHOS 190*  BILITOT 0.7  PROT 8.0  ALBUMIN 3.7   No results for input(s): LIPASE, AMYLASE in the last 168 hours. No results for input(s): AMMONIA in the last 168 hours.  CBC: Recent Labs  Lab 07/09/18 1818 07/10/18 0300 07/11/18 0430  WBC 5.6 5.1 4.7  NEUTROABS 3.6  --   --   HGB 11.4* 9.9* 9.6*  HCT 36.3* 30.9* 30.7*  MCV 93.6 91.4 91.9  PLT 172 129* 135*    INR: Recent Labs  Lab 07/08/18 07/10/18 0300 07/11/18 0430  INR 1.9* 2.38 2.30    Other results:     Imaging   No results found.   Medications:     Scheduled Medications: . atorvastatin  80 mg Oral q1800  . docusate sodium  200 mg Oral Daily  . lactose free nutrition  237 mL Oral TID BM  . levothyroxine  150 mcg Oral QAC breakfast  . mexiletine  250 mg Oral  Q12H  . pantoprazole  40 mg Oral BID AC  . ranolazine  500 mg Oral BID  . sildenafil  20 mg Oral TID  . Warfarin - Pharmacist Dosing Inpatient   Does not apply q1800    Infusions: . sodium chloride 10 mL/hr at 07/11/18 0700  . amiodarone 30 mg/hr (07/11/18 0601)    PRN Medications: sodium chloride, acetaminophen, furosemide, ondansetron (ZOFRAN) IV, promethazine, temazepam   Patient Profile   [name] is a 77 y.o. male with h/o VT, PAD and severe systolic HF (EF 85-46%) due to mixed ischemic/nonischemic CM he is s/p HM II LVAD implant for destination therapy on October 19, 2013.VAD implantation was complicated by VT, syncope, AF/Aflutter and RHF. Required short term milrinone.   Presented to [facility] 07/09/18 with sustained VT in 160s with multiple flow alarms and low MAPs. Given 1L IVF and two doses of neosynephrine with  improvement in MAPs. Sedated by Dr. Johnney Killian in ER and emergently cardioverted to NSR.   Assessment/Plan:    1. Recurrent VTwith hypotension and low flow alarms on VAD - Underwent DC-CV 9/17, 08/17/17, and 04/01/18 - S/p emergent DC-CV in  ER 07/09/18  -Continue amiodarone gtt for now. Will discuss timing of transition to po with MD.  - Keep K > 4.0 Mg > 2.0 - EP has seen and started on mexiletine.  2. Chronic systolic HF: s/p HM-II LVAD 10/2014.  - Recent RHC in 1/19 with mildly low output. Started on milrinone without benefitso it was stopped.  -Volume status looks OK to mildly elevated. He takes po lasix 20 mg as needed. With low flows will follow for now.  - s/p previous driveline [id number]. No further pump stops - VAD interrogated personally. Parameters stable. - MAPs improved. 3. 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  - Had 2 BCG infusions with Dr. Gloriann Loan but discontinued due to inability to tolerate - S/p repeat resection 7/19. Stable.  4. CKD, stage III:  - Had ESRD in setting of obstructive uropathy and ATN. Required HD x1(11/2017) - Cr 1.36 this am.   5. 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 Continue coumadin and plavix.(with h/o CVA). - Admitted 4/18 with recurrent GIB. Transfused 2u. EGD/colon negative - Admitted 12./18. hgb 6.5, Transfused 5u. EGD with 2 AVMs treated with APC - Hadbeen getting octreotide infusions but too expensive for him. Willcontinue to hold for now - No overt bleeding. Hgb 9.6 this am.  6. Atrial arrhythmias: Atrial fibrillation, atrial tachycardia.  - s/p multiple DC-CVs last 07/02/18  - Continue amiodarone.  7. HTN:  -BP had been labile. Stable 80-90s this am. .  8. LVAD:  - VAD interrogated personally. Parameters stable.  - LDH 215 9. Anticoagulation management:  - Goal INR 2-2.5 - INR 2.30. Dosing per pharm-D.   10. Lumbar  compression fracture: - S/p L3-L5 fusion 02/03/16. - Stable. 11. 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  -Continue atorvastatin. (andwarfarin INR goal 2-2.5). S/p R CEA 01/13/16.  - Plavix stopped 12/18 due to recurrent GIB. Stable.   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 Forde Dandy in past.  - Amio restarted due to recurrent atrial arrhythmias and VT.  - TSH 8.91 (Elevated). FT2 1.04 (WNL) after adjustement to synthroid in 12/18  - No change.  13. Insomnia -Continue temazepam 30 qhs prn. No change.   Length of Stay: 2  Legrand Como  Joesph July, PA-C 07/11/2018, 8:06 AM   VAD Team --- VAD ISSUES ONLY--- Pager [phone] (7am - 7am)  Advanced Heart Failure Team  Pager [phone] (M-F; 7a - 4p)  Please contact [facility] for night-coverage after hours (4p -7a ) and weekends on [email or web address]  Patient seen and examined with the above-signed Advanced Practice Provider and/or Housestaff. I personally reviewed laboratory data, imaging studies and relevant notes. I independently examined the patient and formulated the important aspects of the plan. I have edited the note to reflect any of my changes or salient points. I have personally discussed the plan with the patient and/or family.  No further VT overnight. Volume status ok. Discussed with Dr. Caryl Comes at length. Will add Sotalol to amio for control of atrial and ventricular arrhythmias. Watch QT. Will stop mexilitene. Will need to stay in house over weekend for monitoring. MAPs ok. INR 2.30. Discussed dosing with PharmD personally.  Glori Bickers, MD  5:47 PM

## 2018-07-11 NOTE — Progress Notes (Signed)
Progress Note  Patient Name: [name] Date of Encounter: 07/11/2018  Primary Cardiologist: *DB  Primary Electrophysiologist: SK  Patient Profile      [name] is a 77 y.o. male with a hx of VT, PAD 01/2016 R CEA, and severe systolic HF (EF 21-19%) due to mixed ischemic/nonischemic CM he is s/p HM II LVAD implant for destination therapy on October 19, 2013.VAD implantation was complicated by VT, syncope, AF/Aflutter and RHF who is being seen today for the evaluation of VT Device treated BUT FAILED ATP  mostly  Subjective   Without coplmplaint No Sob or chest pain   Inpatient Medications    Scheduled Meds: . atorvastatin  80 mg Oral q1800  . docusate sodium  200 mg Oral Daily  . lactose free nutrition  237 mL Oral TID BM  . levothyroxine  150 mcg Oral QAC breakfast  . mexiletine  250 mg Oral Q12H  . pantoprazole  40 mg Oral BID AC  . ranolazine  500 mg Oral BID  . sildenafil  20 mg Oral TID  . warfarin  2 mg Oral ONCE-1800  . Warfarin - Pharmacist Dosing Inpatient   Does not apply q1800   Continuous Infusions: . sodium chloride 10 mL/hr at 07/11/18 0900  . amiodarone 30 mg/hr (07/11/18 0601)   PRN Meds: sodium chloride, acetaminophen, furosemide, ondansetron (ZOFRAN) IV, promethazine, temazepam   Vital Signs    Vitals:   07/11/18 0700 07/11/18 0728 07/11/18 0800 07/11/18 0900  BP: 95/82 95/82 108/81   Pulse: 74  81 74  Resp: 15  17 15   Temp:  98.1 F (36.7 C)    TempSrc:  Oral    SpO2: 96%  90% 92%  Weight:      Height:        Intake/Output Summary (Last 24 hours) at 07/11/2018 1013 Last data filed at 07/11/2018 0900 Gross per 24 hour  Intake 614.06 ml  Output 2100 ml  Net -1485.94 ml   Filed Weights   07/09/18 1923 07/10/18 0500 07/11/18 0600  Weight: 78.5 kg 77.8 kg 79.1 kg    Telemetry    NO VT - Personally Reviewed  ECG     - Personally Reviewed  Physical Exam   GEN: No acute distress.   Neck: JVD  Cardiac: RRR, no   murmurs, rubs, or gallops.  Respiratory: Clear to auscultation bilaterally. GI: Soft, nontender, non-distended  MS: *no* edema; No deformity. Neuro:  Nonfocal  Psych: Normal affect  Skin Warm and dry   Labs    Chemistry Recent Labs  Lab 07/09/18 1818 07/10/18 0300 07/11/18 0430  NA 136 134* 135  K 4.3 4.0 4.3  CL 101 104 103  CO2 21* 22 26  GLUCOSE 105* 110* 98  BUN 43* 36* 28*  CREATININE 1.52* 1.48* 1.36*  CALCIUM 9.5 8.4* 8.7*  PROT 8.0  --   --   ALBUMIN 3.7  --   --   AST 61*  --   --   ALT 41  --   --   ALKPHOS 190*  --   --   BILITOT 0.7  --   --   GFRNONAA 42* 44* 48*  GFRAA 49* 50* 56*  ANIONGAP 14 8 6      Hematology Recent Labs  Lab 07/09/18 1818 07/10/18 0300 07/11/18 0430  WBC 5.6 5.1 4.7  RBC 3.88* 3.38* 3.34*  HGB 11.4* 9.9* 9.6*  HCT 36.3* 30.9* 30.7*  MCV 93.6 91.4 91.9  MCH 29.4 29.3 28.7  MCHC 31.4 32.0 31.3  RDW 22.5* 22.1* 21.9*  PLT 172 129* 135*    Cardiac EnzymesNo results for input(s): TROPONINI in the last 168 hours. No results for input(s): TROPIPOC in the last 168 hours.   BNPNo results for input(s): BNP, PROBNP in the last 168 hours.   DDimer No results for input(s): DDIMER in the last 168 hours.   Radiology    No results found.  Cardiac Studies     Device Interrogation    Device reviewed  Mostly failed ATP  Will discuss with DB and pt about making more aggressive    Assessment & Plan    Ventricular tachycardia  Sustained   CHF -chronic systolic   ICD-Medtronic The patient's device was interrogated and the information was fully reviewed.  The device was reprogrammed As Below   LVAD  ISCHEMIC CARDIOMYOPATHY  TIA/CVA  AFib/ flutter  Loss of R waves-ICD  Treated hypothyroidism  Sinus node dysfunction  1 AVBlock    Recurrent VT and mex added yesterday  But has had two hospitalizations for atrial arrhythmias also   Have spoke with DR DB re trying sotalol adjunctively to amiodarone rather  than mexilitene as the latter will have impact on the ventricle nut not on the atrium   Discussed with pharmacy and willdose at 80 bid 2/2 renal inusfficiency    igned, Virl Axe, MD  07/11/2018, 10:13 AM

## 2018-07-11 NOTE — Progress Notes (Signed)
ANTICOAGULATION CONSULT NOTE - Initial Consult  Pharmacy Consult for warfarin Indication: atrial fibrillation, LVAD  Allergies  Allergen Reactions  . Demerol [Meperidine] Other (See Comments)    Paralysis/ Could only move eyes    Patient Measurements: Height: 6' (182.9 cm) [name] (79.1 kg) IBW/kg (Calculated) : 77.6 Vital Signs: Temp: 98.1 F (36.7 C) (09/06 0728) Temp Source: Oral (09/06 0728) BP: 95/82 (09/06 0728) Pulse Rate: 74 (09/06 0700)  Labs: Recent Labs    07/09/18 1818 07/10/18 0300 07/11/18 0430  HGB 11.4* 9.9* 9.6*  HCT 36.3* 30.9* 30.7*  PLT 172 129* 135*  LABPROT  --  25.8* 25.1*  INR  --  2.38 2.30  CREATININE 1.52* 1.48* 1.36*    Estimated Creatinine Clearance: 49.1 mL/min (A) (by C-G formula based on SCr of 1.36 mg/dL (H)).   Medical History: Past Medical History:  Diagnosis Date  . Arthritis   . Automatic implantable cardioverter-defibrillator in situ    a. s/p Medtronic Evera device serial [id number]    . Bradycardia   . CAD (coronary artery disease)    a. s/p MI 1982;  s/p DES to CFX 2011;  s/p NSTEMI 4/12 in setting of VTach;  cath 7/12:  LM ok, LAD mid 30-40%, Dx's with 40%, mCFX stent patent, prox to mid RCA occluded with R to R and L to R collats.  Medical Tx was continued  . Chronic systolic heart failure ([place])    a. s/p LVAD 10/2013 for destination therapy  . CKD (chronic kidney disease)   . CVD (cerebrovascular disease)    a. s/p L CEA 2008  . Cytopenia   . Dysrhythmia    AFIB  . History of GI bleed    a. on ASA- started on plavix during 10/2015 admission for CVA  . HLD (hyperlipidemia)   . HTN (hypertension)   . Hypothyroidism   . Inappropriate therapy from implantable cardioverter-defibrillator    a. ATP for sinus tach SVT wavelet identified SVT but was no  passive (2014)  . Ischemic cardiomyopathy   . 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 L Ear   . Myocardial infarction ([place])    1992  . PAF (paroxysmal atrial  fibrillation) ([place])   . PVD (peripheral vascular disease) ([place])    a. 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 Shortness of breath dyspnea    W/ EXERTION   . Sleep apnea    NO CPAP NOW  HE SENT IT BACK YRS AGO  . Stroke [facility])    a. 10/2015 admission   . Ventricular tachycardia ([place])    a. s/p AICD;   h/o ICD shock;  Amiodarone Rx. S/P ablation in 2012   Assessment: 77 year old male with LVAD well known to pharmacy. Patient found to be having low flow alarms and VT transmitted on ICD, instructed by MD to come to ED for evaluation. In ED patient was still in VT and was cardioverted. Home warfarin regimen is 2mg  daily except 4mg  on Tuesdays  Patient on warfarin for his afib and LVAD. INR within range at 2.3 s/p warfarin 2mg  last night. No signs/symptoms of bleeding.   Goal of Therapy:  INR goal 2-2.5 Monitor platelets by anticoagulation protocol: Yes   Plan:  Warfarin 2mg  tonight Daily INR   Claiborne Billings, PharmD PGY2 Cardiology Pharmacy  Resident Phone [phone] 07/11/2018 9:35 AM

## 2018-07-11 NOTE — Progress Notes (Signed)
LVAD Coordinator Rounding Note:  On 07/09/18 pt called to report he started feeling bad this am; remote transmission sent to device clinic and was found to be in V tach. Wife driving patient to ED. Multiple low flow alarms noted. Cardioverted in ER, then admitted to [place]   Vital signs: Temp: 98.8 HR:75 Doppler Pressure:not done Automatic BP:111/95 (102) O2 Sat: 95% on RA  Weight: 173>178>174.3lbs  LVAD interrogation reveals:  Speed: 8800 Flow:3.7 Power: 4.4w PI: 5.9 Alarms: none Events:none   Fixed speed: 8800 Low speed limit: 8200  Drive Line: Sorbaview dressing dry and intact; anchor intact and accurately applied. Weekly dressing changes.May be changed by [facility], or bedside nurse.  Labs:  LDH trend: 237>226>240>215  INR trend: 1.84>1.71>2.46>2.30   Creat trend: 1.54>1.61>2.15>1.36  Anticoagulation Plan: - INR Goal: 2.0 - 2.5 - ASA Dose: no ASA (removed 02/2017 due to GI bleed)  Device: -Dual Medtronic ICD -Therapies: on at 200 bpm - Last device check 10/02/17  Significant Events on VAD Support:  09/01/14>>ureteral stone with cystoscopy and flexible ureteroscopy with lithotripsy and basket extraction 02/2015 >>GIB with 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 Started on Plavix 3/17>>Lumbar fusion L3-L5 9/17>>VT with DC-CV; amio increased 10/17>>ramp with RV failure; speed decreased to 8800 02/2017> GIB (removed ASA, scopes negative) 03/2017>percutaneous lead repair after pump stops  10/18>>Slow VT. Cardioverted in ED and amiodarone uptitrated 12/18>>melena with scope. Hgb 6.5. EGD with 2 AVMs with APC 12/18>>recurrent hematuria. 6 mm left ureteral stone 1/19>>7 mm left ureteral stone removal with laser lithotripsy; left ureteral stent. Resection of 6 cm bladder tumor     Plan/Recommendations:  1. Please call VAD pager if any issues with VAD equipment or questions, concerns.  Emerson Monte RN [place] Coordinator  Office: [phone]  24/7 Pager: [phone]

## 2018-07-11 NOTE — Progress Notes (Signed)
Pharmacy Review for Sotalol Initiation  Admit Complaint: 77 y.o. male admitted 07/09/2018 with sustained VT with multiple flow alarms and low MAPs s/p cardioverted to NSR. Has hx of both ventricular tachycardia and paroxysmal atrial fibrillation. Now plan to be initiated on sotalol. Discussed between Heart Failure and Electrophysiology Team about adding sotalol onto current amiodarone.   Assessment: Baseline QTc 542 on check today- on amiodarone. Scr 1.36 (CrCl using TBW 50 mL/min).    Ref. Range 07/11/2018 04:30  Magnesium Latest Ref Range: 1.7 - 2.4 mg/dL 2.0     Ref. Range 07/11/2018 04:30  Potassium Latest Ref Range: 3.5 - 5.1 mmol/L 4.3   EP/HF service aware on concurrent amiodarone - okay to continue current plan. Mexiletine stopped this morning (last dose on 9/6 at 0814). No other interacting medications.   Patient has been appropriately anticoagulated with warfarin.  Goal of Therapy: Follow renal function, electrolytes, potential drug interactions, and dose adjustment.   Plan:  1. Start sotalol 80 mg twice daily.  2. Follow up QTc after the first 6 doses, renal function, electrolytes (K & Mg) daily x 3 days, dose adjustment.   Doylene Canard, PharmD Clinical Pharmacist  Pager: [phone] Phone: [phone]  11:30 AM 07/11/2018

## 2018-07-12 LAB — CBC
HEMATOCRIT: 31.4 % — AB (ref 39.0–52.0)
Hemoglobin: 9.8 g/dL — ABNORMAL LOW (ref 13.0–17.0)
MCH: 28.7 pg (ref 26.0–34.0)
MCHC: 31.2 g/dL (ref 30.0–36.0)
MCV: 92.1 fL (ref 78.0–100.0)
Platelets: 127 10*3/uL — ABNORMAL LOW (ref 150–400)
RBC: 3.41 MIL/uL — ABNORMAL LOW (ref 4.22–5.81)
RDW: 21.7 % — AB (ref 11.5–15.5)
WBC: 5.3 10*3/uL (ref 4.0–10.5)

## 2018-07-12 LAB — MAGNESIUM: MAGNESIUM: 1.9 mg/dL (ref 1.7–2.4)

## 2018-07-12 LAB — BASIC METABOLIC PANEL
ANION GAP: 7 (ref 5–15)
BUN: 30 mg/dL — ABNORMAL HIGH (ref 8–23)
CO2: 24 mmol/L (ref 22–32)
CREATININE: 1.37 mg/dL — AB (ref 0.61–1.24)
Calcium: 8.6 mg/dL — ABNORMAL LOW (ref 8.9–10.3)
Chloride: 102 mmol/L (ref 98–111)
GFR, EST AFRICAN AMERICAN: 55 mL/min — AB (ref >60)
GFR, EST NON AFRICAN AMERICAN: 48 mL/min — AB (ref >60)
Glucose, Bld: 99 mg/dL (ref 70–99)
Potassium: 4.5 mmol/L (ref 3.5–5.1)
SODIUM: 133 mmol/L — AB (ref 135–145)

## 2018-07-12 LAB — PROTIME-INR
INR: 2.49
PROTHROMBIN TIME: 26.7 s — AB (ref 11.4–15.2)

## 2018-07-12 LAB — LACTATE DEHYDROGENASE: LDH: 201 U/L — ABNORMAL HIGH (ref 98–192)

## 2018-07-12 MED ORDER — WARFARIN SODIUM 2 MG PO TABS
2.0000 mg | ORAL_TABLET | Freq: Once | ORAL | Status: AC
  Administered 2018-07-12: 2 mg via ORAL
  Filled 2018-07-12: qty 1

## 2018-07-12 MED ORDER — MAGNESIUM SULFATE 2 GM/50ML IV SOLN
2.0000 g | Freq: Once | INTRAVENOUS | Status: AC
  Administered 2018-07-12: 2 g via INTRAVENOUS
  Filled 2018-07-12: qty 50

## 2018-07-12 NOTE — Progress Notes (Signed)
Patient ID: [name], male   DOB: [birth year], 77 y.o.   MRN: [id number]   Advanced Heart Failure VAD Team Note  PCP-Cardiologist: No primary care provider on file.   Subjective:    Presented to [facility] 07/09/18 with sustained VT in 160s with multiple flow alarms and low MAPs. Given 1L IVF and two doses of neosynephrine with improvement in MAPs. Sedated by Dr. Johnney Killian in ER and emergently cardioverted to NSR.   Seen by EP, sotalol added.  ECG today with a-pacing, long PR, LBBB with QTc 597 msec.    MAP 80s this morning, no complaints.   Cr 1.37. K 4.5. Hgb 9.6 => 9.8. Mg 1.9.   LVAD Interrogation HM 2: Speed: 8800 Flow: 3.3 PI: 6.2 Power: 4.1. 2 PI events, no low flows.   Objective:    Vital Signs:   Temp:  [98.2 F (36.8 C)-98.8 F (37.1 C)] 98.5 F (36.9 C) (09/07 0449) Pulse Rate:  [74-78] 75 (09/07 0449) Resp:  [12-17] 17 (09/07 0449) BP: (62-115)/(52-95) 72/56 (09/07 0449) SpO2:  [94 %-100 %] 100 % (09/07 0449) Weight:  [78.4 kg] 78.4 kg (09/07 0529) Last BM Date: 07/11/18 Mean arterial Pressure 82  Intake/Output:   Intake/Output Summary (Last 24 hours) at 07/12/2018 1050 Last data filed at 07/12/2018 0800 Gross per 24 hour  Intake 893.61 ml  Output 1425 ml  Net -531.39 ml     Physical Exam    General: Well appearing this am. NAD.  HEENT: Normal. Neck: Supple, JVP 8-9 cm. Carotids OK.  Cardiac:  Mechanical heart sounds with LVAD hum present.  Lungs:  CTAB, normal effort.  Abdomen:  NT, ND, no HSM. No bruits or masses. +BS  LVAD exit site: Well-healed and incorporated. Dressing dry and intact. No erythema or drainage. Stabilization device present and accurately applied. Driveline dressing changed daily per sterile technique. Extremities:  Warm and dry. No cyanosis, clubbing, rash, or edema.  Neuro:  Alert & oriented x 3. Cranial nerves grossly intact. Moves all 4 extremities w/o difficulty. Affect pleasant      Telemetry   NSR 70s, personally reviewed.    EKG    A-paced, very long PR interval, LBBB, QTc 597 msec (personally reviewed)  Labs   Basic Metabolic Panel: Recent Labs  Lab 07/09/18 1818 07/10/18 0300 07/11/18 0430 07/12/18 0151  NA 136 134* 135 133*  K 4.3 4.0 4.3 4.5  CL 101 104 103 102  CO2 21* 22 26 24   GLUCOSE 105* 110* 98 99  BUN 43* 36* 28* 30*  CREATININE 1.52* 1.48* 1.36* 1.37*  CALCIUM 9.5 8.4* 8.7* 8.6*  MG 2.2  --  2.0 1.9    Liver Function Tests: Recent Labs  Lab 07/09/18 1818  AST 61*  ALT 41  ALKPHOS 190*  BILITOT 0.7  PROT 8.0  ALBUMIN 3.7   No results for input(s): LIPASE, AMYLASE in the last 168 hours. No results for input(s): AMMONIA in the last 168 hours.  CBC: Recent Labs  Lab 07/09/18 1818 07/10/18 0300 07/11/18 0430 07/12/18 0151  WBC 5.6 5.1 4.7 5.3  NEUTROABS 3.6  --   --   --   HGB 11.4* 9.9* 9.6* 9.8*  HCT 36.3* 30.9* 30.7* 31.4*  MCV 93.6 91.4 91.9 92.1  PLT 172 129* 135* 127*    INR: Recent Labs  Lab 07/08/18 07/10/18 0300 07/11/18 0430 07/12/18 0151  INR 1.9* 2.38 2.30 2.49    Other results:     Imaging   No results  found.   Medications:     Scheduled Medications: . amiodarone  200 mg Oral BID  . atorvastatin  80 mg Oral q1800  . docusate sodium  200 mg Oral Daily  . lactose free nutrition  237 mL Oral TID BM  . levothyroxine  150 mcg Oral QAC breakfast  . pantoprazole  40 mg Oral BID AC  . sildenafil  20 mg Oral TID  . sotalol  80 mg Oral Q12H  . Warfarin - Pharmacist Dosing Inpatient   Does not apply q1800    Infusions: . sodium chloride Stopped (07/11/18 1203)  . magnesium sulfate 1 - 4 g bolus IVPB      PRN Medications: sodium chloride, acetaminophen, furosemide, promethazine, temazepam   Patient Profile   [name] is a 77 y.o. male with h/o VT, PAD and severe systolic HF (EF 40-34%) due to mixed ischemic/nonischemic CM he is s/p HM II LVAD implant for destination therapy on October 19, 2013.VAD implantation was  complicated by VT, syncope, AF/Aflutter and RHF. Required short term milrinone.   Presented to [facility] 07/09/18 with sustained VT in 160s with multiple flow alarms and low MAPs. Given 1L IVF and two doses of neosynephrine with improvement in MAPs. Sedated by Dr. Johnney Killian in ER and emergently cardioverted to NSR.   Assessment/Plan:    1. Recurrent VTwith hypotension and low flow alarms on VAD: Underwent DC-CV 9/17, 08/17/17, and 04/01/18.  S/p emergent DC-CV in ER 07/09/18  -Continue amiodarone gtt.  - Sotalol 80 mg bid added, QTc up to 597.  With LBBB, would like to see < 550.  I will have him stop ranolazine now that he is on sotalol.  Hopefully, this will narrow down the QT.  - Keep K > 4.0 Mg > 2.0 (dose of magnesium sulfate this morning).  2. Chronic systolic HF: s/p HM-II LVAD 10/2014. s/p previous driveline [id number]. No further pump stops. [place] in 1/19 with mildly low output. Started on milrinone without benefitso it was stopped. Mild volume overload on exam.  LVAD interrogated, parameters stable.  No more low flows. MAP stable.  - Will give dose of Lasix po today.  3. Recurrent renal stone and bladder tumor: S/p stone removal.  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  Had 2 BCG infusions with Dr. Gloriann Loan but discontinued due to inability to tolerate.  S/p repeat resection 7/19. Stable.  4. CKD, stage III:  Had AKI in setting of obstructive uropathy and ATN. Required HD x1(11/2017).  Creatinine stable today.  5. Recurrent GIB:  s/p clipping of 2 jejunal AVMs in 4/16. Continue coumadin and plavix.(with h/o CVA). Admitted 4/18 with recurrent GIB. Transfused 2u. EGD/colon negative. Admitted 12./18. hgb 6.5, Transfused 5u. EGD with 2 AVMs treated with APC. - No overt bleeding. Hgb 9.8 this am.  - Hadbeen getting octreotide infusions but too expensive for him. Willcontinue to hold for now 6. Atrial arrhythmias: Atrial fibrillation, atrial tachycardia. s/p multiple DC-CVs last  07/02/18.  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  - Continue amiodarone.  7. HTN: MAP stable in 80s this morning.   8. LVAD: VAD interrogated personally. Parameters stable.  LDH 201. 9. Anticoagulation management: Goal INR 2-2.5. INR 2.49. Dosing per pharm-D.   10. Lumbar compression fracture: S/p L3-L5 fusion 02/03/16. - Stable. 11. 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 S/p R CEA 01/13/16. Plavix stopped 12/18 due to recurrent GIB. - Continue atorvastatin (andwarfarin INR goal 2-2.5).  12. H/o amio-induced hypethyroidism:  Followed by Dr.  [place] in past.  - Amio restarted due to recurrent atrial arrhythmias and VT.  13. Insomnia - Continue temazepam 30 qhs prn. No change.   Length of Stay: 3  Loralie Champagne, MD 07/12/2018, 10:50 AM   VAD Team --- VAD ISSUES ONLY--- Pager [phone] (7am - 7am)  Advanced Heart Failure Team  Pager [phone] (M-F; 7a - 4p)  Please contact [facility] for night-coverage after hours (4p -7a ) and weekends on [email or web address]

## 2018-07-12 NOTE — Progress Notes (Signed)
ANTICOAGULATION CONSULT NOTE - Initial Consult  Pharmacy Consult for warfarin Indication: atrial fibrillation, LVAD  Allergies  Allergen Reactions  . Demerol [Meperidine] Other (See Comments)    Paralysis/ Could only move eyes    Patient Measurements: Height: 6' (182.9 cm) Weight: 172 lb 13.5 oz (78.4 kg) IBW/kg (Calculated) : 77.6 Vital Signs: Temp: 97.9 F (36.6 C) (09/07 1134) Temp Source: Oral (09/07 1134) BP: 94/76 (09/07 1134) Pulse Rate: 75 (09/07 0449)  Labs: Recent Labs    07/10/18 0300 07/11/18 0430 07/12/18 0151  HGB 9.9* 9.6* 9.8*  HCT 30.9* 30.7* 31.4*  PLT 129* 135* 127*  LABPROT 25.8* 25.1* 26.7*  INR 2.38 2.30 2.49  CREATININE 1.48* 1.36* 1.37*    Estimated Creatinine Clearance: 48.8 mL/min (A) (by C-G formula based on SCr of 1.37 mg/dL (H)).   Medical History: Past Medical History:  Diagnosis Date  . Arthritis   . Automatic implantable cardioverter-defibrillator in situ    a. s/p Medtronic Evera device serial [id number]    . Bradycardia   . CAD (coronary artery disease)    a. s/p MI 1982;  s/p DES to CFX 2011;  s/p NSTEMI 4/12 in setting of VTach;  cath 7/12:  LM ok, LAD mid 30-40%, Dx's with 40%, mCFX stent patent, prox to mid RCA occluded with R to R and L to R collats.  Medical Tx was continued  . Chronic systolic heart failure ([place])    a. s/p LVAD 10/2013 for destination therapy  . CKD (chronic kidney disease)   . CVD (cerebrovascular disease)    a. s/p L CEA 2008  . Cytopenia   . Dysrhythmia    AFIB  . History of GI bleed    a. on ASA- started on plavix during 10/2015 admission for CVA  . HLD (hyperlipidemia)   . HTN (hypertension)   . Hypothyroidism   . Inappropriate therapy from implantable cardioverter-defibrillator    a. ATP for sinus tach SVT wavelet identified SVT but was no  passive (2014)  . Ischemic cardiomyopathy   . 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 L Ear   . Myocardial infarction ([place])    1992  . PAF (paroxysmal atrial  fibrillation) ([place])   . PVD (peripheral vascular disease) ([place])    a. 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 Shortness of breath dyspnea    W/ EXERTION   . Sleep apnea    NO CPAP NOW  HE SENT IT BACK YRS AGO  . Stroke [facility])    a. 10/2015 admission   . Ventricular tachycardia ([place])    a. s/p AICD;   h/o ICD shock;  Amiodarone Rx. S/P ablation in 2012   Assessment: 77 year old male with LVAD well known to pharmacy. Patient found to be having low flow alarms and VT transmitted on ICD, instructed by MD to come to ED for evaluation. In ED patient was still in VT and was cardioverted. Home warfarin regimen is 2mg  daily except 4mg  on Tuesdays  Patient on warfarin for his afib and LVAD. INR within range at 2.49 s/p warfarin 2mg  last night. H/H stable, LDH stable. No signs/symptoms of bleeding.   Goal of Therapy:  INR goal 2-2.5 Monitor platelets by anticoagulation protocol: Yes   Plan:  Warfarin 2mg  tonight Daily INR   Claiborne Billings, PharmD PGY2 Cardiology Pharmacy  Resident Phone [phone] 07/12/2018 1:03 PM

## 2018-07-13 LAB — PROTIME-INR
INR: 2.48
Prothrombin Time: 26.6 seconds — ABNORMAL HIGH (ref 11.4–15.2)

## 2018-07-13 LAB — CBC
HEMATOCRIT: 31.8 % — AB (ref 39.0–52.0)
Hemoglobin: 10.3 g/dL — ABNORMAL LOW (ref 13.0–17.0)
MCH: 29.5 pg (ref 26.0–34.0)
MCHC: 32.4 g/dL (ref 30.0–36.0)
MCV: 91.1 fL (ref 78.0–100.0)
Platelets: 146 10*3/uL — ABNORMAL LOW (ref 150–400)
RBC: 3.49 MIL/uL — ABNORMAL LOW (ref 4.22–5.81)
RDW: 21.5 % — AB (ref 11.5–15.5)
WBC: 6 10*3/uL (ref 4.0–10.5)

## 2018-07-13 LAB — BASIC METABOLIC PANEL
ANION GAP: 9 (ref 5–15)
BUN: 32 mg/dL — ABNORMAL HIGH (ref 8–23)
CALCIUM: 8.7 mg/dL — AB (ref 8.9–10.3)
CO2: 24 mmol/L (ref 22–32)
Chloride: 101 mmol/L (ref 98–111)
Creatinine, Ser: 1.59 mg/dL — ABNORMAL HIGH (ref 0.61–1.24)
GFR calc Af Amer: 46 mL/min — ABNORMAL LOW (ref >60)
GFR calc non Af Amer: 40 mL/min — ABNORMAL LOW (ref >60)
Glucose, Bld: 98 mg/dL (ref 70–99)
Potassium: 4.4 mmol/L (ref 3.5–5.1)
Sodium: 134 mmol/L — ABNORMAL LOW (ref 135–145)

## 2018-07-13 LAB — MAGNESIUM: Magnesium: 2.1 mg/dL (ref 1.7–2.4)

## 2018-07-13 LAB — LACTATE DEHYDROGENASE: LDH: 208 U/L — ABNORMAL HIGH (ref 98–192)

## 2018-07-13 MED ORDER — WARFARIN SODIUM 2 MG PO TABS
2.0000 mg | ORAL_TABLET | Freq: Once | ORAL | Status: AC
  Administered 2018-07-13: 2 mg via ORAL
  Filled 2018-07-13: qty 1

## 2018-07-13 MED ORDER — SOTALOL HCL 80 MG PO TABS
80.0000 mg | ORAL_TABLET | Freq: Every day | ORAL | Status: DC
  Administered 2018-07-14: 80 mg via ORAL
  Filled 2018-07-13: qty 1

## 2018-07-13 NOTE — Progress Notes (Signed)
Patient ID: [name], male   DOB: 04-May-1941, 77 y.o.   MRN: [id number]   Advanced Heart Failure VAD Team Note  PCP-Cardiologist: No primary care provider on file.   Subjective:    Presented to [facility] 07/09/18 with sustained VT in 160s with multiple flow alarms and low MAPs. Given 1L IVF and two doses of neosynephrine with improvement in MAPs. Sedated by Dr. Johnney Killian in ER and emergently cardioverted to NSR.   Seen by EP, sotalol added.  ECG today with a-pacing, long PR, LBBB with QTc 547 msec (shorter QTc off ranolazine).   MAP 80s this morning, no complaints. He got a dose of Lasix yesterday, weight down 2 lbs.   Cr 1.37 => 1.59. K 4.5. Hgb 9.6 => 9.8 => 10.3.    LVAD Interrogation HM 2: Speed: 8800 Flow: 3.9 PI: 6.7 Power: 4.4. 10 PI events, no low flows (had PIs after he got Lasix).   Objective:    Vital Signs:   Temp:  [97.6 F (36.4 C)-98.6 F (37 C)] 97.6 F (36.4 C) (09/08 0813) Pulse Rate:  [74-77] 74 (09/08 0400) Resp:  [12-19] 18 (09/08 0813) BP: (91-101)/(73-88) 101/88 (09/08 0813) SpO2:  [98 %-100 %] 98 % (09/08 0800) Weight:  [77.4 kg] 77.4 kg (09/08 0609) Last BM Date: 07/11/18 Mean arterial Pressure 80s  Intake/Output:   Intake/Output Summary (Last 24 hours) at 07/13/2018 0938 Last data filed at 07/13/2018 0800 Gross per 24 hour  Intake 410.02 ml  Output 1700 ml  Net -1289.98 ml     Physical Exam    General: Well appearing this am. NAD.  HEENT: Normal. Neck: Supple, JVP 8 cm. Carotids OK.  Cardiac:  Mechanical heart sounds with LVAD hum present.  Lungs:  CTAB, normal effort.  Abdomen:  NT, ND, no HSM. No bruits or masses. +BS  LVAD exit site: Well-healed and incorporated. Dressing dry and intact. No erythema or drainage. Stabilization device present and accurately applied. Driveline dressing changed daily per sterile technique. Extremities:  Warm and dry. No cyanosis, clubbing, rash, or edema.  Neuro:  Alert & oriented x 3. Cranial nerves grossly  intact. Moves all 4 extremities w/o difficulty. Affect pleasant     Telemetry   NSR 70s, personally reviewed.   EKG    A-paced, very long PR interval, LBBB, QTc 547 msec (personally reviewed)  Labs   Basic Metabolic Panel: Recent Labs  Lab 07/09/18 1818 07/10/18 0300 07/11/18 0430 07/12/18 0151 07/13/18 0352  NA 136 134* 135 133* 134*  K 4.3 4.0 4.3 4.5 4.4  CL 101 104 103 102 101  CO2 21* 22 26 24 24   GLUCOSE 105* 110* 98 99 98  BUN 43* 36* 28* 30* 32*  CREATININE 1.52* 1.48* 1.36* 1.37* 1.59*  CALCIUM 9.5 8.4* 8.7* 8.6* 8.7*  MG 2.2  --  2.0 1.9 2.1    Liver Function Tests: Recent Labs  Lab 07/09/18 1818  AST 61*  ALT 41  ALKPHOS 190*  BILITOT 0.7  PROT 8.0  ALBUMIN 3.7   No results for input(s): LIPASE, AMYLASE in the last 168 hours. No results for input(s): AMMONIA in the last 168 hours.  CBC: Recent Labs  Lab 07/09/18 1818 07/10/18 0300 07/11/18 0430 07/12/18 0151 07/13/18 0352  WBC 5.6 5.1 4.7 5.3 6.0  NEUTROABS 3.6  --   --   --   --   HGB 11.4* 9.9* 9.6* 9.8* 10.3*  HCT 36.3* 30.9* 30.7* 31.4* 31.8*  MCV 93.6 91.4 91.9 92.1  91.1  PLT 172 129* 135* 127* 146*    INR: Recent Labs  Lab 07/08/18 07/10/18 0300 07/11/18 0430 07/12/18 0151 07/13/18 0352  INR 1.9* 2.38 2.30 2.49 2.48    Other results:     Imaging   No results found.   Medications:     Scheduled Medications: . amiodarone  200 mg Oral BID  . atorvastatin  80 mg Oral q1800  . docusate sodium  200 mg Oral Daily  . lactose free nutrition  237 mL Oral TID BM  . levothyroxine  150 mcg Oral QAC breakfast  . pantoprazole  40 mg Oral BID AC  . sildenafil  20 mg Oral TID  . sotalol  80 mg Oral Q12H  . Warfarin - Pharmacist Dosing Inpatient   Does not apply q1800    Infusions: . sodium chloride 250 mL (07/12/18 1101)    PRN Medications: sodium chloride, acetaminophen, furosemide, promethazine, temazepam   Patient Profile   [name] is a 77 y.o. male  with h/o VT, PAD and severe systolic HF (EF 70-01%) due to mixed ischemic/nonischemic CM he is s/p HM II LVAD implant for destination therapy on October 19, 2013.VAD implantation was complicated by VT, syncope, AF/Aflutter and RHF. Required short term milrinone.   Presented to [facility] 07/09/18 with sustained VT in 160s with multiple flow alarms and low MAPs. Given 1L IVF and two doses of neosynephrine with improvement in MAPs. Sedated by Dr. Johnney Killian in ER and emergently cardioverted to NSR.   Assessment/Plan:    1. Recurrent VTwith hypotension and low flow alarms on VAD: Underwent DC-CV 9/17, 08/17/17, and 04/01/18.  S/p emergent DC-CV in ER 07/09/18  -Continue amiodarone gtt.  - Sotalol 80 mg bid added with significant prolongation of QTc yesterday.  After stopping ranolazine, QTc down to 547 which is OK given LBBB.    - Keep K > 4.0 Mg > 2.0 2. Chronic systolic HF: s/p HM-II LVAD 10/2014. s/p previous driveline [id number]. No further pump stops. [place] in 1/19 with mildly low output. Started on milrinone without benefitso it was stopped. Mild volume overload on exam.  LVAD interrogated, parameters stable.  No more low flows. MAP stable.  Weight down 2 lbs with Lasix yesterday.  - Does not need Lasix today.  3. Recurrent renal stone and bladder tumor: S/p stone removal.  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  Had 2 BCG infusions with Dr. Gloriann Loan but discontinued due to inability to tolerate.  S/p repeat resection 7/19. Stable.  4. CKD, stage III:  Had AKI in setting of obstructive uropathy and ATN. Required HD x1(11/2017).  Creatinine stable today.  5. Recurrent GIB:  s/p clipping of 2 jejunal AVMs in 4/16. Continue coumadin and plavix.(with h/o CVA). Admitted 4/18 with recurrent GIB. Transfused 2u. EGD/colon negative. Admitted 12./18. hgb 6.5, Transfused 5u. EGD with 2 AVMs treated with APC. No overt bleeding. Hgb 10.3 this am.  - Hadbeen getting octreotide infusions but too  expensive for him. Willcontinue to hold for now 6. Atrial arrhythmias: Atrial fibrillation, atrial tachycardia. s/p multiple DC-CVs last 07/02/18.  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  - Continue amiodarone.  7. HTN: MAP stable in 80s this morning.   8. LVAD: VAD interrogated personally. Parameters stable.  LDH 201 => 208. 9. Anticoagulation management: Goal INR 2-2.5. INR 2.48. Dosing per pharm-D.   10. Lumbar compression fracture: S/p L3-L5 fusion 02/03/16. - Stable. 11. 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 S/p R CEA 01/13/16. Plavix  stopped 12/18 due to recurrent GIB. - Continue atorvastatin (andwarfarin INR goal 2-2.5).  12. H/o amio-induced hypethyroidism:  Followed by Dr. Forde Dandy in past.  - Amio restarted due to recurrent atrial arrhythmias and VT.  13. Insomnia - Continue temazepam 30 qhs prn. No change.   If rhythm and QTc remain stable, home tomorrow.   Length of Stay: 4  Loralie Champagne, MD 07/13/2018, 9:38 AM   VAD Team --- VAD ISSUES ONLY--- Pager [phone] (7am - 7am)  Advanced Heart Failure Team  Pager [phone] (M-F; 7a - 4p)  Please contact [facility] for night-coverage after hours (4p -7a ) and weekends on [email or web address]

## 2018-07-13 NOTE — Progress Notes (Signed)
LVAD driveline sorbaview dressing changed by this RN via sterile technique. Driveline site unremarkable,no drainage. Anchor changed as well. [name]

## 2018-07-13 NOTE — Progress Notes (Signed)
QTC 3 hours post sotolol was .55. [name]

## 2018-07-13 NOTE — Progress Notes (Signed)
3 hour post sotalol QTC .543

## 2018-07-13 NOTE — Progress Notes (Signed)
[place] for warfarin Indication: atrial fibrillation, LVAD  Allergies  Allergen Reactions  . Demerol [Meperidine] Other (See Comments)    Paralysis/ Could only move eyes    Patient Measurements: Height: 6' (182.9 cm) Weight: 170 lb 11.2 oz (77.4 kg) IBW/kg (Calculated) : 77.6 Vital Signs: Temp: 97.6 F (36.4 C) (09/08 0813) Temp Source: Oral (09/08 0813) BP: 100/70 (09/08 1212) Pulse Rate: 74 (09/08 0400)  Labs: Recent Labs    07/11/18 0430 07/12/18 0151 07/13/18 0352  HGB 9.6* 9.8* 10.3*  HCT 30.7* 31.4* 31.8*  PLT 135* 127* 146*  LABPROT 25.1* 26.7* 26.6*  INR 2.30 2.49 2.48  CREATININE 1.36* 1.37* 1.59*    Estimated Creatinine Clearance: 41.9 mL/min (A) (by C-G formula based on SCr of 1.59 mg/dL (H)).   Medical History: Past Medical History:  Diagnosis Date  . Arthritis   . Automatic implantable cardioverter-defibrillator in situ    a. s/p Medtronic Evera device serial [id number]    . Bradycardia   . CAD (coronary artery disease)    a. s/p MI 1982;  s/p DES to CFX 2011;  s/p NSTEMI 4/12 in setting of VTach;  cath 7/12:  LM ok, LAD mid 30-40%, Dx's with 40%, mCFX stent patent, prox to mid RCA occluded with R to R and L to R collats.  Medical Tx was continued  . Chronic systolic heart failure ([place])    a. s/p LVAD 10/2013 for destination therapy  . CKD (chronic kidney disease)   . CVD (cerebrovascular disease)    a. s/p L CEA 2008  . Cytopenia   . Dysrhythmia    AFIB  . History of GI bleed    a. on ASA- started on plavix during 10/2015 admission for CVA  . HLD (hyperlipidemia)   . HTN (hypertension)   . Hypothyroidism   . Inappropriate therapy from implantable cardioverter-defibrillator    a. ATP for sinus tach SVT wavelet identified SVT but was no  passive (2014)  . Ischemic cardiomyopathy   . 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 L Ear   . Myocardial infarction ([place])    1992  . PAF (paroxysmal atrial fibrillation)  ([place])   . PVD (peripheral vascular disease) ([place])    a. 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 Shortness of breath dyspnea    W/ EXERTION   . Sleep apnea    NO CPAP NOW  HE SENT IT BACK YRS AGO  . Stroke [facility])    a. 10/2015 admission   . Ventricular tachycardia ([place])    a. s/p AICD;   h/o ICD shock;  Amiodarone Rx. S/P ablation in 2012   Assessment: 77 year old male with LVAD well known to pharmacy. Patient found to be having low flow alarms and VT transmitted on ICD, instructed by MD to come to ED for evaluation. In ED patient was still in VT and was cardioverted. Home warfarin regimen is 2mg  daily except 4mg  on Tuesdays  Patient on warfarin for his afib and LVAD. INR remains stable at 2.48 s/p warfarin 2mg  last night. H/H stable, LDH stable. No signs/symptoms of bleeding.   Goal of Therapy:  INR goal 2-2.5 Monitor platelets by anticoagulation protocol: Yes   Plan:  Warfarin 2mg  tonight Daily INR   Claiborne Billings, PharmD PGY2 Cardiology Pharmacy Resident Phone [phone])  [phone] 07/13/2018 12:55 PM

## 2018-07-14 LAB — MAGNESIUM: MAGNESIUM: 2 mg/dL (ref 1.7–2.4)

## 2018-07-14 LAB — BASIC METABOLIC PANEL
ANION GAP: 8 (ref 5–15)
BUN: 28 mg/dL — ABNORMAL HIGH (ref 8–23)
CALCIUM: 8.8 mg/dL — AB (ref 8.9–10.3)
CO2: 25 mmol/L (ref 22–32)
Chloride: 104 mmol/L (ref 98–111)
Creatinine, Ser: 1.5 mg/dL — ABNORMAL HIGH (ref 0.61–1.24)
GFR, EST AFRICAN AMERICAN: 50 mL/min — AB (ref >60)
GFR, EST NON AFRICAN AMERICAN: 43 mL/min — AB (ref >60)
GLUCOSE: 99 mg/dL (ref 70–99)
Potassium: 4.4 mmol/L (ref 3.5–5.1)
Sodium: 137 mmol/L (ref 135–145)

## 2018-07-14 LAB — CBC
HEMATOCRIT: 32.9 % — AB (ref 39.0–52.0)
Hemoglobin: 10.4 g/dL — ABNORMAL LOW (ref 13.0–17.0)
MCH: 28.9 pg (ref 26.0–34.0)
MCHC: 31.6 g/dL (ref 30.0–36.0)
MCV: 91.4 fL (ref 78.0–100.0)
PLATELETS: 162 10*3/uL (ref 150–400)
RBC: 3.6 MIL/uL — ABNORMAL LOW (ref 4.22–5.81)
RDW: 21.7 % — AB (ref 11.5–15.5)
WBC: 5.2 10*3/uL (ref 4.0–10.5)

## 2018-07-14 LAB — PROTIME-INR
INR: 2.37
Prothrombin Time: 25.7 seconds — ABNORMAL HIGH (ref 11.4–15.2)

## 2018-07-14 LAB — LACTATE DEHYDROGENASE: LDH: 204 U/L — AB (ref 98–192)

## 2018-07-14 MED ORDER — BOOST PLUS PO LIQD: 237.0000 mL | Freq: Three times a day (TID) | ORAL | 0 refills | Status: DC

## 2018-07-14 MED ORDER — SOTALOL HCL 80 MG PO TABS: 80.0000 mg | ORAL_TABLET | Freq: Two times a day (BID) | ORAL | 6 refills | Status: AC

## 2018-07-14 MED ORDER — WARFARIN SODIUM 2 MG PO TABS: 2.0000 mg | ORAL_TABLET | ORAL | 6 refills | Status: DC

## 2018-07-14 NOTE — Care Management Important Message (Signed)
Important Message  Patient Details  Name: [name] MRN: [id number] Date of Birth: [birth year]   Medicare Important Message Given:  Yes    Curran Lenderman P Jessup Ogas 07/14/2018, 2:15 PM

## 2018-07-14 NOTE — Progress Notes (Signed)
LVAD Coordinator Rounding Note:  On 07/09/18 pt called to report he started feeling bad this am; remote transmission sent to device clinic and was found to be in V tach. Wife driving patient to ED. Multiple low flow alarms noted. Cardioverted in ER, then admitted to [facility].   Vital signs: Temp: 98.4 HR:75 Doppler Pressure:76 Automatic BP:91/80 (86) O2 Sat: 95% on RA  Weight: 173>178>174.3lbs  LVAD interrogation reveals:  Speed: 8800 Flow:4.0 Power: 4.5w PI: 4.4 Alarms: none Events:none   Fixed speed: 8800 Low speed limit: 8200  Drive Line: Sorbav[place] dressing dry and intact; anchor intact and accurately applied. Weekly dressing changes.May be changed by [facility], or bedside nurse.  Labs:  LDH trend: 237>226>240>215>204  INR trend: 1.84>1.71>2.46>2.30>2.37   Creat trend: 1.54>1.61>2.15>1.36>1.50  Anticoagulation Plan: - INR Goal: 2.0 - 2.5 - ASA Dose: no ASA (removed 02/2017 due to GI bleed)  Device: -Dual Medtronic ICD -Therapies: on at 200 bpm - Last device check 10/02/17  Significant Events on VAD Support:  09/01/14>>ureteral stone with cystoscopy and flexible ureteroscopy with lithotripsy and basket extraction 02/2015 >>GIB with 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 Started on Plavix 3/17>>Lumbar fusion L3-L5 9/17>>VT with DC-CV; amio increased 10/17>>ramp with RV failure; speed decreased to 8800 02/2017> GIB (removed ASA, scopes negative) 03/2017>percutaneous lead repair after pump stops  10/18>>Slow VT. Cardioverted in ED and amiodarone uptitrated 12/18>>melena with scope. Hgb 6.5. EGD with 2 AVMs with APC 12/18>>recurrent hematuria. 6 mm left ureteral stone 1/19>>7 mm left ureteral stone removal with laser lithotripsy; left ureteral stent. Resection of 6 cm bladder tumor     Plan/Recommendations:  1. Please call VAD pager if any issues with VAD equipment or questions, concerns. 2. Possible discharge today. 1 week follow up in VAD clinic scheduled.   Emerson Monte RN [place] Coordinator  Office: [phone]  24/7 Pager: [phone]

## 2018-07-14 NOTE — Progress Notes (Signed)
QTC 1-2 hours post sotalol was 0.542. HS [employer]

## 2018-07-14 NOTE — Discharge Summary (Addendum)
Advanced Heart Failure Team  Discharge Summary   Patient ID: [name] MRN: [id number], DOB/AGE: 04/02/1941 77 y.o. Admit date: 07/09/2018 D/C date:     07/14/2018   Primary Discharge Diagnoses:  1. 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 Chronic systolic HF 3. h/orenal stone and bladder tumor 4. CKD stage III 5. H/o GIB 6. Atrial arrhythmias  - S/p DCCV 9/17, 08/17/17, 04/01/18 7. HTN 8. LVAD: HM 2 - INR goal 2-2.5 on Coumadin (no ASA) - Has PT/INR machine at home - Speed 8800 9. Anticoagulation management 10. Lumbar compression fracture 11. CVA 12. H/w amio-induced hyperthyroidism - Previously followed by Dr Forde Dandy 13. Insomnia  Hospital Course:  Frank WHITTLEY is a 77 y.o. male with h/o VT, PAD and severe systolic HF (EF 56-43%) due to mixed ischemic/nonischemic CM he is s/p HM II LVAD implant for destination therapy on October 19, 2013.VAD implantation was complicated by VT, syncope, AF/Aflutter and RHF. Required short term milrinone.   Presented to [facility] 07/09/18 with sustained VT in 160s with multiple flow alarms and low MAPs. Given 1L IVF and two doses of neosynephrine with improvement in MAPs. Sedated by Dr. Johnney Killian in ER and emergently cardioverted to NSR. He was started on amiodarone drip.   EP was consulted and he was initially started on mexilitine, but was switched to sotolol 80 mg BID for better ventricular impact. Amio transitioned to PO. He has a history of amio-induced hypertyroidism, but risk though to outweigh benefit. He will need close monitoring. Ranexa was stopped due to prolonged QTc. QTc 542 (with LBBB) on day of discharge. He had no further VT or low flow alarms.  Volume was managed with PRN lasix. Renal function monitored closely and remained stable.   VAD parameters remained stable once back in NSR. Hemoglobin and LDH monitored and were stable.    He will follow up closely in VAD clinic, with appointment as below. He will need EKG, BMET, and PT/INR at follow up. He will need close monitoring of thyroid function tests now that he is back on amiodarone.  LVAD Interrogation HM II:   Speed: 8800     Flow: 3.4      PI: 6.9     Power: 4      Back-up speed: 8200      Discharge Weight: 169 lbs Discharge Vitals: Blood pressure 93/77, pulse 75, temperature 98.4 F (36.9 C), temperature source Oral, resp. rate 16, height 6' (1.829 m), weight 76.6 kg, SpO2 98 %.  Labs: Lab Results  Component Value Date   WBC 5.2 07/14/2018   HGB 10.4 (L) 07/14/2018   HCT 32.9 (L) 07/14/2018   MCV 91.4 07/14/2018   PLT 162 07/14/2018    Recent Labs  Lab 07/09/18 1818  07/14/18 0325  NA 136   < > 137  K 4.3   < > 4.4  CL 101   < > 104  CO2 21*   < > 25  BUN 43*   < > 28*  CREATININE 1.52*   < > 1.50*  CALCIUM 9.5   < > 8.8*  PROT 8.0  --   --   BILITOT 0.7  --   --   ALKPHOS 190*  --   --   ALT 41  --   --   AST 61*  --   --   GLUCOSE 105*   < >  99   < > = values in this interval not displayed.   Lab Results  Component Value Date   CHOL 106 11/05/2015   HDL 33 (L) 11/05/2015   LDLCALC 60 11/05/2015   TRIG 67 11/05/2015   BNP (last 3 results) Recent Labs    08/17/17 1900  BNP 627.8*    ProBNP (last 3 results) No results for input(s): PROBNP in the last 8760 hours.   Diagnostic Studies/Procedures   No results found.  Discharge Medications   Allergies as of 07/14/2018      Reactions   Demerol [meperidine] Other (See Comments)   Paralysis/ Could only move eyes      Medication List    STOP taking these medications   ranolazine 500 MG 12 hr tablet Commonly known as:  RANEXA     TAKE these medications   amiodarone 200 MG tablet Commonly known as:  PACERONE Take 200 mg by mouth 2 (two) times daily.   atorvastatin 80 MG tablet Commonly known as:  LIPITOR TAKE 1 TABLET DAILY AT 6 PM What changed:  See the new  instructions.   docusate sodium 100 MG capsule Commonly known as:  COLACE Take 200 mg by mouth daily.   furosemide 20 MG tablet Commonly known as:  LASIX Take 20 mg by mouth daily as needed for fluid.   ICAPS PO Take 1 tablet by mouth 2 (two) times daily.   MULTIVITAMIN PO Take 1 tablet by mouth daily.   lactose free nutrition Liqd Take 237 mLs by mouth 3 (three) times daily between meals.   levothyroxine 150 MCG tablet Commonly known as:  SYNTHROID, LEVOTHROID Take 1 tablet (150 mcg total) by mouth daily before breakfast.   pantoprazole 40 MG tablet Commonly known as:  PROTONIX TAKE 1 TABLET TWICE DAILY What changed:  when to take this   promethazine 12.5 MG tablet Commonly known as:  PHENERGAN Take 12.5 mg by mouth every 6 (six) hours as needed for nausea or vomiting.   sildenafil 20 MG tablet Commonly known as:  REVATIO Take 1 tablet (20 mg total) by mouth 3 (three) times daily.   sotalol 80 MG tablet Commonly known as:  BETAPACE Take 1 tablet (80 mg total) by mouth 2 (two) times daily.   temazepam 30 MG capsule Commonly known as:  RESTORIL Take 1 capsule (30 mg total) by mouth at bedtime as needed for sleep.   warfarin 2 MG tablet Commonly known as:  COUMADIN Take as directed. If you are unsure how to take this medication, talk to your nurse or doctor. Original instructions:  Take 1 tablet (2 mg total) by mouth See admin instructions. Take 1 tablet (2mg ) daily. Further instruction per VAD clinic. What changed:  additional instructions   zinc gluconate 50 MG tablet Take 50 mg by mouth 2 (two) times daily.       Disposition   The patient will be discharged in stable condition to home. Discharge Instructions    (HEART FAILURE PATIENTS) Call MD:  Anytime you have any of the following symptoms: 1) 3 pound weight gain in 24 hours or 5 pounds in 1 week 2) shortness of breath, with or without a dry hacking cough 3) swelling in the hands, feet or stomach 4) if  you have to sleep on extra pillows at night in order to breathe.   Complete by:  As directed    Call MD for:  persistant dizziness or light-headedness   Complete by:  As directed  Call MD for:  redness, tenderness, or signs of infection (pain, swelling, redness, odor or green/yellow discharge around incision site)   Complete by:  As directed    Diet - low sodium heart healthy   Complete by:  As directed    Discharge instructions   Complete by:  As directed    Check PT/INR on Monday   INR  Goal: 2 - 2.5   Complete by:  As directed    Goal:  2 - 2.5   Increase activity slowly   Complete by:  As directed    Page VAD Coordinator at [phone]  Notify for: any VAD alarms, sustained elevations of power >10 watts, sustained drop in Pulse Index <3   Complete by:  As directed    Notify for:   any VAD alarms sustained elevations of power >10 watts sustained drop in Pulse Index <3     Speed Settings:   Complete by:  As directed    Fixed 8800 RPM Low 8200 RPM     Follow-up Information    [facility] Follow up on 07/21/2018.   Specialty:  Cardiology Why:  VAD follow up 12:00. Garage code [id number]. Contact information: [address] [id number] [place] [phone]            Duration of Discharge Encounter: Greater than 35 minutes   Signed, Georgiana Shore, NP  07/14/2018, 12:07 PM   Patient seen and examined with the above-signed Advanced Practice Provider and/or Housestaff. I personally reviewed laboratory data, imaging studies and relevant notes. I independently examined the patient and formulated the important aspects of the plan. I have edited the note to reflect any of my changes or salient points. I have personally discussed the plan with the patient and/or family.  Rhythm stable on sotalol 80 bid and amio. Will give 6th dose of sotalol today and check QTc. Now off Ranexa. Sotalol dose also  lowered to 80 bid. Volume status stable. VAD interrogated personally. Parameters stable. INR 2.37. Discussed dosing with PharmD personally.  Home later today.   Glori Bickers, MD  8:59 AM

## 2018-07-14 NOTE — Progress Notes (Addendum)
Patient ID: [name], male   DOB: 1941/03/31, 77 y.o.   MRN: [id number]   Advanced Heart Failure VAD Team Note  PCP-Cardiologist: No primary care provider on file.   Subjective:    Presented to [facility] 07/09/18 with sustained VT in 160s with multiple flow alarms and low MAPs. Given 1L IVF and two doses of neosynephrine with improvement in MAPs. Sedated by Dr. Johnney Killian in ER and emergently cardioverted to NSR.   Seen by EP, sotalol added. ECG yesterday with a-pacing, long PR, LBBB with QTc 547 msec (shorter QTc off ranolazine). (pending today) Now on sotalol 80 bid  Feeling good today. Wants to go home. Denies SOB or palpitations. No edema.  No further VT or AF.   Cr 1.37 -> 1.59 -> 1.50. K4.4.. Hgb stable at 10.4.   LVAD Interrogation HM 2: Speed: 8800 Flow: 3.4 PI: 6.9 Power: 4.0. 1 PI event.  Objective:    Vital Signs:   Temp:  [97.7 F (36.5 C)-98.9 F (37.2 C)] 98.4 F (36.9 C) (09/09 0404) Pulse Rate:  [75-76] 75 (09/09 0404) Resp:  [14-20] 15 (09/09 0404) BP: (72-100)/(61-80) 91/80 (09/09 0735) SpO2:  [98 %-100 %] 98 % (09/09 0404) Weight:  [76.6 kg] 76.6 kg (09/09 0404) Last BM Date: 07/12/18 Mean arterial Pressure 80s  Intake/Output:   Intake/Output Summary (Last 24 hours) at 07/14/2018 0841 Last data filed at 07/14/2018 0735 Gross per 24 hour  Intake 600 ml  Output 1775 ml  Net -1175 ml    Physical Exam    General: Well appearing this am. NAD.  HEENT: Normal. Anicteric  Neck: Supple, JVP 8 cm. Carotids OK.  Cardiac:  Mechanical heart sounds with LVAD hum present.  Lungs:  CTAB, normal effort.  No wheeze Abdomen:  NT, ND, no HSM. No bruits or masses. +BS  LVAD exit site: Well-healed and incorporated. Dressing dry and intact. No erythema or drainage. Stabilization device present and accurately applied. Driveline dressing changed daily per sterile technique. Extremities: no cyanosis, clubbing, rash, edema Neuro: alert & oriented x 3, cranial nerves grossly  intact. moves all 4 extremities w/o difficulty. Affect pleasant  Telemetry   NSR 70s, personally reviewed.   EKG    07/13/18, A-paced, very long PR interval, LBBB, QTc 547 msec (personally reviewed)  Labs   Basic Metabolic Panel: Recent Labs  Lab 07/09/18 1818 07/10/18 0300 07/11/18 0430 07/12/18 0151 07/13/18 0352 07/14/18 0325  NA 136 134* 135 133* 134* 137  K 4.3 4.0 4.3 4.5 4.4 4.4  CL 101 104 103 102 101 104  CO2 21* 22 26 24 24 25   GLUCOSE 105* 110* 98 99 98 99  BUN 43* 36* 28* 30* 32* 28*  CREATININE 1.52* 1.48* 1.36* 1.37* 1.59* 1.50*  CALCIUM 9.5 8.4* 8.7* 8.6* 8.7* 8.8*  MG 2.2  --  2.0 1.9 2.1 2.0    Liver Function Tests: Recent Labs  Lab 07/09/18 1818  AST 61*  ALT 41  ALKPHOS 190*  BILITOT 0.7  PROT 8.0  ALBUMIN 3.7   No results for input(s): LIPASE, AMYLASE in the last 168 hours. No results for input(s): AMMONIA in the last 168 hours.  CBC: Recent Labs  Lab 07/09/18 1818 07/10/18 0300 07/11/18 0430 07/12/18 0151 07/13/18 0352 07/14/18 0325  WBC 5.6 5.1 4.7 5.3 6.0 5.2  NEUTROABS 3.6  --   --   --   --   --   HGB 11.4* 9.9* 9.6* 9.8* 10.3* 10.4*  HCT 36.3* 30.9* 30.7* 31.4*  31.8* 32.9*  MCV 93.6 91.4 91.9 92.1 91.1 91.4  PLT 172 129* 135* 127* 146* 162   INR: Recent Labs  Lab 07/10/18 0300 07/11/18 0430 07/12/18 0151 07/13/18 0352 07/14/18 0325  INR 2.38 2.30 2.49 2.48 2.37   Other results:    Imaging   No results found.  Medications:    Scheduled Medications: . amiodarone  200 mg Oral BID  . atorvastatin  80 mg Oral q1800  . docusate sodium  200 mg Oral Daily  . lactose free nutrition  237 mL Oral TID BM  . levothyroxine  150 mcg Oral QAC breakfast  . pantoprazole  40 mg Oral BID AC  . sildenafil  20 mg Oral TID  . sotalol  80 mg Oral Daily  . Warfarin - Pharmacist Dosing Inpatient   Does not apply q1800    Infusions: . sodium chloride 250 mL (07/12/18 1101)    PRN Medications: sodium chloride,  acetaminophen, furosemide, promethazine, temazepam  Patient Profile   ERUBIEL [name] is a 77 y.o. male with h/o VT, PAD and severe systolic HF (EF 52-77%) due to mixed ischemic/nonischemic CM he is s/p HM II LVAD implant for destination therapy on October 19, 2013.VAD implantation was complicated by VT, syncope, AF/Aflutter and RHF. Required short term milrinone.   Presented to [facility] 07/09/18 with sustained VT in 160s with multiple flow alarms and low MAPs. Given 1L IVF and two doses of neosynephrine with improvement in MAPs. Sedated by Dr. Johnney Killian in ER and emergently cardioverted to NSR.   Assessment/Plan:    1. Recurrent VTwith hypotension and low flow alarms on VAD: Underwent DC-CV 9/17, 08/17/17, and 04/01/18.  S/p emergent DC-CV in ER 07/09/18  -Continue amiodarone gtt.  - Sotalol 80 mg BID added with significant prolongation of QTc yesterday.  After stopping ranolazine, QTc down to 547 which is OK given LBBB.    - EKG today after sotalol dose. If QTc stable can likely go home.  - Keep K > 4.0 Mg > 2.0 2. Chronic systolic HF: s/p HM-II LVAD 10/2014. s/p previous driveline [id number]. No further pump stops. [place] in 1/19 with mildly low output. Started on milrinone without benefitso it was stopped. Mild volume overload on exam.  LVAD interrogated, parameters stable.  No more low flows. MAP stable.   - Weight down another 2 lbs.  - Can continue lasix prn only.  - Does not need Lasix today.  3. Recurrent renal stone and bladder tumor: S/p stone removal.  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  Had 2 BCG infusions with Dr. Gloriann Loan but discontinued due to inability to tolerate.  S/p repeat resection 7/19.  - Stable 4. CKD, stage III:  Had AKI in setting of obstructive uropathy and ATN. Required HD x1(11/2017).   - Cr stable today.  5. Recurrent GIB:  s/p clipping of 2 jejunal AVMs in 4/16. Continue coumadin and plavix.(with h/o CVA). Admitted 4/18 with recurrent GIB.  Transfused 2u. EGD/colon negative. Admitted 12./18. hgb 6.5, Transfused 5u. EGD with 2 AVMs treated with APC. No overt bleeding.  - Hgb 10.4 this am.  am.  - Hadbeen getting octreotide infusions but too expensive for him. Willcontinue to hold for now 6. Atrial arrhythmias: Atrial fibrillation, atrial tachycardia. s/p multiple DC-CVs last 07/02/18.  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  - Continue amiodarone.  7. HTN:  - MAPs stable.  8. LVAD:  - VAD interrogated personally. Parameters stable.   - LDH 204 9. Anticoagulation  management: Goal INR 2-2.5. INR 2.48. Dosing per pharm-D.   10. Lumbar compression fracture: S/p L3-L5 fusion 02/03/16. - Stable.  11. 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 S/p R CEA 01/13/16. Plavix stopped 12/18 due to recurrent GIB. - Continue atorvastatin (andwarfarin INR goal 2-2.5).  - No change to current plan.  12. H/o amio-induced hypethyroidism:  Followed by Dr. Forde Dandy in past.  - Amio restarted due to recurrent atrial arrhythmias and VT.  - Will follow.  13. Insomnia - Continue temazepam 30 qhs prn.  - No change.   Length of Stay: 5  Annamaria Helling 07/14/2018, 8:41 AM   VAD Team --- VAD ISSUES ONLY--- Pager [phone] (7am - 7am)  Advanced Heart Failure Team  Pager [phone] (M-F; 7a - 4p)  Please contact [facility] for night-coverage after hours (4p -7a ) and weekends on [email or web address]  Patient seen and examined with the above-signed Advanced Practice Provider and/or Housestaff. I personally reviewed laboratory data, imaging studies and relevant notes. I independently examined the patient and formulated the important aspects of the plan. I have edited the note to reflect any of my changes or salient points. I have personally discussed the plan with the patient and/or family.  Rhythm stable on sotalol 80 bid and amio. Will give 6th dose of sotalol today and check QTc. Now off Ranexa. Sotalol dose also lowered to 80 bid. Volume status stable.  VAD interrogated personally. Parameters stable. INR 2.37. Discussed dosing with PharmD personally.  Home later today.   Glori Bickers, MD  8:59 AM

## 2018-07-14 NOTE — Progress Notes (Signed)
[name] for warfarin Indication: atrial fibrillation, LVAD  Allergies  Allergen Reactions  . Demerol [Meperidine] Other (See Comments)    Paralysis/ Could only move eyes    Patient Measurements: Height: 6' (182.9 cm) Weight: 168 lb 14 oz (76.6 kg) IBW/kg (Calculated) : 77.6 Vital Signs: Temp: 98.4 F (36.9 C) (09/09 0404) Temp Source: Oral (09/09 0404) BP: 91/80 (09/09 0735) Pulse Rate: 75 (09/09 0404)  Labs: Recent Labs    07/12/18 0151 07/13/18 0352 07/14/18 0325  HGB 9.8* 10.3* 10.4*  HCT 31.4* 31.8* 32.9*  PLT 127* 146* 162  LABPROT 26.7* 26.6* 25.7*  INR 2.49 2.48 2.37  CREATININE 1.37* 1.59* 1.50*    Estimated Creatinine Clearance: 44 mL/min (A) (by C-G formula based on SCr of 1.5 mg/dL (H)).   Medical History: Past Medical History:  Diagnosis Date  . Arthritis   . Automatic implantable cardioverter-defibrillator in situ    a. s/p Medtronic Evera device serial [id number]    . Bradycardia   . CAD (coronary artery disease)    a. s/p MI 1982;  s/p DES to CFX 2011;  s/p NSTEMI 4/12 in setting of VTach;  cath 7/12:  LM ok, LAD mid 30-40%, Dx's with 40%, mCFX stent patent, prox to mid RCA occluded with R to R and L to R collats.  Medical Tx was continued  . Chronic systolic heart failure ([place])    a. s/p LVAD 10/2013 for destination therapy  . CKD (chronic kidney disease)   . CVD (cerebrovascular disease)    a. s/p L CEA 2008  . Cytopenia   . Dysrhythmia    AFIB  . History of GI bleed    a. on ASA- started on plavix during 10/2015 admission for CVA  . HLD (hyperlipidemia)   . HTN (hypertension)   . Hypothyroidism   . Inappropriate therapy from implantable cardioverter-defibrillator    a. ATP for sinus tach SVT wavelet identified SVT but was no  passive (2014)  . Ischemic cardiomyopathy   . 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 Myocardial infarction ([place])    1992  . PAF (paroxysmal atrial fibrillation) ([place])    . PVD (peripheral vascular disease) ([place])    a. 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 Shortness of breath dyspnea    W/ EXERTION   . Sleep apnea    NO CPAP NOW  HE SENT IT BACK YRS AGO  . Stroke [facility])    a. 10/2015 admission   . Ventricular tachycardia ([place])    a. s/p AICD;   h/o ICD shock;  Amiodarone Rx. S/P ablation in 2012   Assessment: 77 year old male with LVAD well known to pharmacy. Patient found to be having low flow alarms and VT transmitted on ICD, instructed by MD to come to ED for evaluation. In ED patient was still in VT and was cardioverted. Home warfarin regimen is 2mg  daily except 4mg  on Tuesdays.  Patient on warfarin for his afib and LVAD. INR remains stable at 2.37 s/p warfarin 2mg  last night. H/H stable, LDH stable. No signs/symptoms of bleeding.   Goal of Therapy:  INR goal 2-2.5 Monitor platelets by anticoagulation protocol: Yes   Plan:  Continue warfarin 2mg  daily for now Would discharge home on 2mg  daily - would check INR  with post-hospital follow up  Erin Hearing PharmD., BCPS Clinical Pharmacist 07/14/2018 9:34 AM

## 2018-07-14 NOTE — Progress Notes (Signed)
Removed PIV access and pt received discharge instructions. Pt understood well. Awaiting for the ride. HS [employer]

## 2018-07-15 ENCOUNTER — Ambulatory Visit (HOSPITAL_COMMUNITY): Payer: Self-pay | Admitting: Pharmacist

## 2018-07-15 LAB — POCT INR: INR: 2.1 (ref 2.0–3.0)

## 2018-07-16 ENCOUNTER — Encounter (HOSPITAL_COMMUNITY): Payer: Medicare Other

## 2018-07-19 ENCOUNTER — Other Ambulatory Visit (HOSPITAL_COMMUNITY): Payer: Self-pay | Admitting: *Deleted

## 2018-07-19 DIAGNOSIS — Z95811 Presence of heart assist device: Secondary | ICD-10-CM

## 2018-07-19 DIAGNOSIS — Z7901 Long term (current) use of anticoagulants: Secondary | ICD-10-CM

## 2018-07-19 DIAGNOSIS — I5022 Chronic systolic (congestive) heart failure: Secondary | ICD-10-CM

## 2018-07-21 ENCOUNTER — Ambulatory Visit (HOSPITAL_COMMUNITY): Payer: Self-pay | Admitting: Pharmacist

## 2018-07-21 ENCOUNTER — Ambulatory Visit (HOSPITAL_COMMUNITY)
Admission: RE | Admit: 2018-07-21 | Discharge: 2018-07-21 | Disposition: A | Discharge status: Home / Self Care | Payer: Medicare Other | Source: Ambulatory Visit | Attending: Cardiology | Admitting: Cardiology

## 2018-07-21 VITALS — BP 84/70 | HR 80 | Wt 174.8 lb

## 2018-07-21 DIAGNOSIS — Z85820 Personal history of malignant melanoma of skin: Secondary | ICD-10-CM | POA: Diagnosis not present

## 2018-07-21 DIAGNOSIS — I4892 Unspecified atrial flutter: Secondary | ICD-10-CM

## 2018-07-21 DIAGNOSIS — Z95811 Presence of heart assist device: Secondary | ICD-10-CM | POA: Diagnosis not present

## 2018-07-21 DIAGNOSIS — E039 Hypothyroidism, unspecified: Secondary | ICD-10-CM | POA: Insufficient documentation

## 2018-07-21 DIAGNOSIS — E785 Hyperlipidemia, unspecified: Secondary | ICD-10-CM | POA: Insufficient documentation

## 2018-07-21 DIAGNOSIS — I130 Hypertensive heart and chronic kidney disease with heart failure and stage 1 through stage 4 chronic kidney disease, or unspecified chronic kidney disease: Secondary | ICD-10-CM | POA: Insufficient documentation

## 2018-07-21 DIAGNOSIS — I252 Old myocardial infarction: Secondary | ICD-10-CM | POA: Diagnosis not present

## 2018-07-21 DIAGNOSIS — Z95810 Presence of automatic (implantable) cardiac defibrillator: Secondary | ICD-10-CM | POA: Diagnosis not present

## 2018-07-21 DIAGNOSIS — I440 Atrioventricular block, first degree: Secondary | ICD-10-CM | POA: Diagnosis not present

## 2018-07-21 DIAGNOSIS — I472 Ventricular tachycardia, unspecified: Secondary | ICD-10-CM

## 2018-07-21 DIAGNOSIS — G4730 Sleep apnea, unspecified: Secondary | ICD-10-CM | POA: Insufficient documentation

## 2018-07-21 DIAGNOSIS — D494 Neoplasm of unspecified behavior of bladder: Secondary | ICD-10-CM

## 2018-07-21 DIAGNOSIS — Z8673 Personal history of transient ischemic attack (TIA), and cerebral infarction without residual deficits: Secondary | ICD-10-CM | POA: Diagnosis not present

## 2018-07-21 DIAGNOSIS — M1990 Unspecified osteoarthritis, unspecified site: Secondary | ICD-10-CM | POA: Diagnosis not present

## 2018-07-21 DIAGNOSIS — I5022 Chronic systolic (congestive) heart failure: Secondary | ICD-10-CM | POA: Insufficient documentation

## 2018-07-21 DIAGNOSIS — I2510 Atherosclerotic heart disease of native coronary artery without angina pectoris: Secondary | ICD-10-CM | POA: Diagnosis not present

## 2018-07-21 DIAGNOSIS — Z7901 Long term (current) use of anticoagulants: Secondary | ICD-10-CM

## 2018-07-21 DIAGNOSIS — Z7902 Long term (current) use of antithrombotics/antiplatelets: Secondary | ICD-10-CM | POA: Diagnosis not present

## 2018-07-21 DIAGNOSIS — N183 Chronic kidney disease, stage 3 unspecified: Secondary | ICD-10-CM

## 2018-07-21 DIAGNOSIS — Z955 Presence of coronary angioplasty implant and graft: Secondary | ICD-10-CM | POA: Diagnosis not present

## 2018-07-21 DIAGNOSIS — Z8719 Personal history of other diseases of the digestive system: Secondary | ICD-10-CM

## 2018-07-21 DIAGNOSIS — I255 Ischemic cardiomyopathy: Secondary | ICD-10-CM | POA: Insufficient documentation

## 2018-07-21 DIAGNOSIS — Z79899 Other long term (current) drug therapy: Secondary | ICD-10-CM | POA: Diagnosis not present

## 2018-07-21 DIAGNOSIS — I480 Paroxysmal atrial fibrillation: Secondary | ICD-10-CM | POA: Insufficient documentation

## 2018-07-21 LAB — PROTIME-INR
INR: 1.62
Prothrombin Time: 19.1 seconds — ABNORMAL HIGH (ref 11.4–15.2)

## 2018-07-21 LAB — CBC
HEMATOCRIT: 35.8 % — AB (ref 39.0–52.0)
Hemoglobin: 10.8 g/dL — ABNORMAL LOW (ref 13.0–17.0)
MCH: 29 pg (ref 26.0–34.0)
MCHC: 30.2 g/dL (ref 30.0–36.0)
MCV: 96 fL (ref 78.0–100.0)
PLATELETS: 172 10*3/uL (ref 150–400)
RBC: 3.73 MIL/uL — ABNORMAL LOW (ref 4.22–5.81)
RDW: 20.4 % — AB (ref 11.5–15.5)
WBC: 6.9 10*3/uL (ref 4.0–10.5)

## 2018-07-21 LAB — BASIC METABOLIC PANEL
Anion gap: 9 (ref 5–15)
BUN: 47 mg/dL — ABNORMAL HIGH (ref 8–23)
CHLORIDE: 106 mmol/L (ref 98–111)
CO2: 22 mmol/L (ref 22–32)
CREATININE: 1.49 mg/dL — AB (ref 0.61–1.24)
Calcium: 9.4 mg/dL (ref 8.9–10.3)
GFR calc non Af Amer: 43 mL/min — ABNORMAL LOW (ref >60)
GFR, EST AFRICAN AMERICAN: 50 mL/min — AB (ref >60)
Glucose, Bld: 81 mg/dL (ref 70–99)
POTASSIUM: 4.4 mmol/L (ref 3.5–5.1)
SODIUM: 137 mmol/L (ref 135–145)

## 2018-07-21 LAB — LACTATE DEHYDROGENASE: LDH: 218 U/L — AB (ref 98–192)

## 2018-07-21 NOTE — Addendum Note (Signed)
Encounter addended by: Mertha Baars, RN on: 07/21/2018 1:33 PM  Actions taken: Sign clinical note

## 2018-07-21 NOTE — Progress Notes (Signed)
Patient ID: [name], male   DOB: Sep 15, 1941, 77 y.o.   MRN: [id number]   VAD CLINIC PROGRESS NOTE  PCP: Dr. Kandice Robinsons ([place]) GU: Dr Gloriann Loan Neuro Surgeon: Dr Vertell Limber Vascular: Dr Kellie Simmering Primary HF: Dr Darlyn Read [name] is a 77 y.o. male  with h/o VT, PAD and severe systolic HF (EF 66-29%) due to mixed ischemic/nonischemic CM he is s/p HM II LVAD implant for destination therapy on October 19, 2013.VAD implantation was complicated by VT, syncope, AF/Aflutter and RHF. Required short term milrinone.   GU Event 09/01/14 had impacted ureteral stone and underwent cystoscopy and flexible ureteroscopy with lithotripsy and basket extraction.  09/2015 Hematuria and chronic nephrolithiasis. Renal US showed mild hyronephrosis and bilateral renal cysts. Dr. Risa Grill took back for repeat lithotripsy and stone extraction by ureteroscopy and J stent placement. 12/18 Recurrent hematuria. 63mm left ureteral stone. Passed with tamulosin 1/19   Admitted with recurrent hematuria with ureteral stone. Stone removed but found to have large bladder mass which was removed. Course c/b obstructive uropathy and AKI requiring 1 session of HD.  3/19 Bladder chemo stopped after 2 treatments as unable to tolerate 7/12 s/p TURB with right ureteral stent placement.   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  4/2-18 - Episode of melena. Hgb 10.9-> 8.7. EGD/colonoscopy negative 12/18 - Melena with syncope  Hgb 6.5. Transfused 5u. 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  01/2016 R CEA. He was started on Plavix.  01/2016 Lumbar fusion L3-L5 with pedicle screws posterolateral arthrodesis with BMP, allograft  VT events 9/17 with high fevers and productive cough. Treated with levaquin x 7 days for acute bronchitis. Also found to have VT and underwent DC-CV. Seen by EP and amio increased 10/17 speed turned down to 8800 due to RV  failure. 10/18 with slow VT and ADHF.Cardioverted in ED and amio uptitrated.  04/01/18 recurrent VT. Underwent DC-CV in Er 07/09/18 presented to ED. Urgently cardioverted. EP started on Sotolol.   Significant VAD Events  03/2017>percutaneous lead repair  Follow up LVAD Clinic: He presents today for post hospital follow up. He has been feeling good, just "a little week".  Chronic DOE is unchanged and at baseline. He denies orthopnea, PND, lightheadedness, or dizziness. Has noticed PI/flows running lower since hospital. He denies bleeding. No melena or neuro symptoms. He is taking all medications as prescribed.   EKG today shows NSR 74 bpm, QTc 603 ms (though with LBBB, ? Wide JT ~ 440). Dr. Haroldine Laws to discuss with Dr. Caryl Comes. May need to consider cutting sotalol back.   Medtronic: Did not result  VAD Indication: Destination Therapy- age excluding    VAD interrogation & Equipment Management: Speed: 8800 Flow: 3.1 Power: 4.0 w PI: 4.1  Alarms: None Events: 5/15 PI event.   Fixed speed: 8800 Low speed limit: 8600  Primary Controller: Replace back up battery in 26 months Back up controller: Replace back up battery in 10months   Exit Site Care: Drive line is maintained weekly by [name]. Drive line exit site well healed and incorporated. The velour is fully implanted at exit site. Dressing dry and intact. No erythema or drainage. Stabilization device present and accurately applied. Pt denies fever or chills.   I reviewed the LVAD parameters from today, and compared the results to the patient's prior recorded data.  No programming changes were made.  The  LVAD is functioning within specified parameters.  The patient performs LVAD self-test daily.  LVAD interrogation was negative for any significant power changes, alarms or PI events/speed drops.  LVAD equipment check completed and is in good working order.  Back-up equipment present.   LVAD education done on emergency procedures and  precautions and reviewed exit site care.   Past Medical History:  Diagnosis Date  . Arthritis   . Automatic implantable cardioverter-defibrillator in situ    a. s/p Medtronic Evera device serial [id number]    . Bradycardia   . CAD (coronary artery disease)    a. s/p MI 1982;  s/p DES to CFX 2011;  s/p NSTEMI 4/12 in setting of VTach;  cath 7/12:  LM ok, LAD mid 30-40%, Dx's with 40%, mCFX stent patent, prox to mid RCA occluded with R to R and L to R collats.  Medical Tx was continued  . Chronic systolic heart failure ([place])    a. s/p LVAD 10/2013 for destination therapy  . CKD (chronic kidney disease)   . CVD (cerebrovascular disease)    a. s/p L CEA 2008  . Cytopenia   . Dysrhythmia    AFIB  . History of GI bleed    a. on ASA- started on plavix during 10/2015 admission for CVA  . HLD (hyperlipidemia)   . HTN (hypertension)   . Hypothyroidism   . Inappropriate therapy from implantable cardioverter-defibrillator    a. ATP for sinus tach SVT wavelet identified SVT but was no  passive (2014)  . Ischemic cardiomyopathy   . 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 L Ear   . Myocardial infarction ([place])    1992  . PAF (paroxysmal atrial fibrillation) ([place])   . PVD (peripheral vascular disease) ([place])    a. 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 Shortness of breath dyspnea    W/ EXERTION   . Sleep apnea    NO CPAP NOW  HE SENT IT BACK YRS AGO  . Stroke [facility])    a. 10/2015 admission   . Ventricular tachycardia ([place])    a. s/p AICD;   h/o ICD shock;  Amiodarone Rx. S/P ablation in 2012    Current Outpatient Medications  Medication Sig Dispense Refill  . amiodarone (PACERONE) 200 MG tablet Take 200 mg by mouth 2 (two) times daily.    [name] atorvastatin (LIPITOR) 80 MG tablet TAKE 1 TABLET DAILY AT 6 PM (Patient taking differently: Take 80 mg by mouth at bedtime. ) 30 tablet 11  . docusate sodium (COLACE)  100 MG capsule Take 200 mg by mouth daily. Taking 1 per day    . furosemide (LASIX) 20 MG tablet Take 20 mg by mouth daily as needed for fluid.    [name] lactose free nutrition (BOOST PLUS) LIQD Take 237 mLs by mouth 3 (three) times daily between meals. 90 Can 0  . levothyroxine (SYNTHROID, LEVOTHROID) 150 MCG tablet Take 1 tablet (150 mcg total) by mouth daily before breakfast. 90 tablet 3  . Multiple Vitamins-Minerals (ICAPS PO) Take 1 tablet by mouth 2 (two) times daily.    . Multiple Vitamins-Minerals (MULTIVITAMIN PO) Take 1 tablet by mouth daily.    . pantoprazole (PROTONIX) 40 MG tablet TAKE 1 TABLET TWICE DAILY (Patient taking differently: Take 40 mg by mouth 2 (two) times daily before a  meal. ) 180 tablet 6  . sildenafil (REVATIO) 20 MG tablet Take 1 tablet (20 mg total) by mouth 3 (three) times daily. 90 tablet 11  . sotalol (BETAPACE) 80 MG tablet Take 1 tablet (80 mg total) by mouth 2 (two) times daily. 60 tablet 6  . temazepam (RESTORIL) 30 MG capsule Take 1 capsule (30 mg total) by mouth at bedtime as needed for sleep. 30 capsule 3  . warfarin (COUMADIN) 2 MG tablet Take 1 tablet (2 mg total) by mouth See admin instructions. Take 1 tablet (2mg ) daily. Further instruction per VAD clinic. 30 tablet 6  . zinc gluconate 50 MG tablet Take 50 mg by mouth 2 (two) times daily.     . promethazine (PHENERGAN) 12.5 MG tablet Take 12.5 mg by mouth every 6 (six) hours as needed for nausea or vomiting.     No current facility-administered medications for this encounter.     Demerol [meperidine]  Review of systems complete and found to be negative unless listed in HPI.    Vitals:   07/21/18 1158 07/21/18 1159  BP: (!) 68/0 (!) 84/70  Pulse: 80   Weight: 79.3 kg (174 lb 12.8 oz)     Vital Signs:  Doppler Pressure: 68 Automatc BP:  84/70 HR: 80 SPO2: UTO% RA  Weight: 174.8 lb w/o eqt Last weight: 169 lbs (discharge weight)   Physical exam: General: NAD.  HEENT: Normal. Neck:  Supple, JVP 7-8 cm. Carotids OK.  Cardiac:  Mechanical heart sounds with LVAD hum present.  Lungs:  CTAB, normal effort.  Abdomen:  NT, ND, no HSM. No bruits or masses. +BS  LVAD exit site: Well-healed and incorporated. Dressing dry and intact. No erythema or drainage. Stabilization device present and accurately applied. Driveline dressing changed daily per sterile technique. Extremities:  Warm and dry. No cyanosis, clubbing, rash, or edema.  Neuro:  Alert & oriented x 3. Cranial nerves grossly intact. Moves all 4 extremities w/o difficulty. Affect pleasant     ASSESSMENT AND PLAN:  1.RecurrentVTwith hypotension and low flow alarms on VAD: Underwent DC-CV 9/17, 08/17/17, and 04/01/18.  S/p emergent DC-CV in ER 07/09/18  -Continue amiodarone gtt.  - EKG today shows NSR 74 bpm, QTc 603 ms (though with LBBB, ? Wide JT ~ 440). Dr. Haroldine Laws to discuss with Dr. Caryl Comes. May need to consider cutting sotalol back.  - Continue Sotalol 80 mg BID for now.  - Keep K > 4.0 Mg > 2.0 2. Chronic systolic HF: s/p HM-II LVAD 10/2014. s/p previous driveline [id number]. No further pump stops. [place] in 1/19 with mildly low output. Started on milrinone without benefitso it was stopped.  - NYHA II-III symptoms chronically - Volume status stable on exam.  - Can continue lasix prn only.  - Does not need Lasix today.  3. Recurrent renal stone and bladder tumor: S/p stone removal.  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  Had 2 BCG infusions with Dr. Gloriann Loan but discontinued due to inability to tolerate.  S/p repeat resection 7/19.  - Stable. 4. CKD, stage III:  Had AKI in setting of obstructive uropathy and ATN. Required HD x1(11/2017).   - Cr 1.49 today.  5. Recurrent GIB:  s/p clipping of 2 jejunal AVMs in 4/16. Continue coumadin and plavix.(with h/o CVA). Admitted 4/18 with recurrent GIB. Transfused 2u. EGD/colon negative. Admitted 12./18. hgb 6.5, Transfused 5u. EGD with 2 AVMs treated with  APC. No overt bleeding.  - Hgb 10.8  - Hadbeen getting  octreotide infusions but too expensive for him. Willcontinue to hold for now 6. Atrial arrhythmias: Atrial fibrillation, atrial tachycardia. s/p multiple DC-CVs last 07/02/18.  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  - Continue amiodarone.  7. HTN:  - MAPs stable.  8. LVAD:  - VAD interrogated personally. PI and Flow lower than prior to starting Sotalol, but relatively stable and asymptomatic.   - LDH 218 9. Anticoagulation management:  - Goal INR 2-2.5. INR 1.62. Dosing per Pharm-D 10. Lumbar compression fracture: S/p L3-L5 fusion 02/03/16. - Stable.  11. 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 S/p R CEA 01/13/16. Plavix stopped 12/18 due to recurrent GIB. - Continue atorvastatin (andwarfarin INR goal 2-2.5).  - No change to current plan.   12. H/o amio-induced hypethyroidism:  Followed by Dr. Forde Dandy in past.  - Amio restarted due to recurrent atrial arrhythmias and VT.  - Follow.  13. Insomnia - Continue temazepam 30 qhs prn.  - No change.   Labs today stable. QTc prolonged but with LBBB Dr. Haroldine Laws to discuss with Dr. Caryl Comes and decide on dosing. May need to cut back. Plan on 2 week RTC for now. May need to move up if meds adjusted.   Shirley Friar, PA-C  12:53 PM   Greater than 50% of the 50 minute visit was spent in counseling/coordination of care regarding disease state education, salt/fluid restriction, sliding scale diuretics, and medication compliance.

## 2018-07-21 NOTE — Patient Instructions (Addendum)
1. 4mg  Coumadin for the next today days, then back to 2mg  schedule.  2. Follow up in [place] clinic in 2 weeks.

## 2018-07-21 NOTE — Progress Notes (Signed)
Patient presents for hospital follow up in [facility] today with caregiver, [name].  Reports no problems with VAD equipment or concerns with drive line exit site.   Pt states that he is feeling ok, just weaker than normal. He says his flow and PI have been lower than normal since he started the Sotalol.   EKG and Medtronic device interrogation completed. Per Jonni Sanger, Dr. Haroldine Laws will share findings with Dr. Caryl Comes.   Vital Signs:  Doppler Pressure: 68 Automatc BP:  84/70 (76) HR: 80  SPO2: unable to obtain  Weight: 174.8 lb w/o eqt Last weight: 175.4 lb  VAD Indication: Destination Therapy- age excluding    VAD interrogation & Equipment Management: Speed: 8800 Flow:  2.6-3.1 Power: 4.0w    PI: 2.9-4.0 while in clinic  Alarms: no clinical alarms Events: 5-15 daily PI events  Fixed speed 8800 Low speed limit: 8200  Primary Controller: Replace back up battery in 26 months Back up controller: Replace back up battery in 55months  Exit Site Care: Drive line is being maintained weekly by [name]. Drive line exit site well healed and incorporated. The velour is fully implanted at exit site. Dressing dry and intact. No erythema or drainage. Stabilization device present and accurately applied. Pt denies fever or chills. Pt given 4 dressing kits today.   Significant Events on VAD Support:  02/2015 >>GIB (removed ASA, 2-2.5 INR) 10/2015 >>CVA(Plavix started) 02/2017> GIB (removed ASA, scopes negative) 03/2017> percutaneous lead repair 12/2017> Bladder CA  Device:Medtronic Therapies: on 200 bpm AT monitor: 140 bpm Last check: today  BP & Labs:  MAP 68 - Doppler is reflecting MAP today.  Hgb 10.0- No S/S of bleeding. Specifically denies melena/BRBPR or nosebleeds. No blood in urine.  LDH stable at 218with established baseline of 280-380. Denies tea-colored urine. No power elevations noted on interrogation.    Patient Instructions:  1. 4mg  Coumadin for the next today  days, then back to 2mg  schedule. (INR 1.62) 2. Follow up in VAD clinic in 2 weeks.    Emerson Monte RN [place] Coordinator  Office: [phone]  24/7 Pager: [phone]

## 2018-07-22 LAB — CUP PACEART REMOTE DEVICE CHECK
Battery Remaining Longevity: 32 mo
Battery Voltage: 2.94 V
Brady Statistic AP VP Percent: 0.56 %
Brady Statistic RV Percent Paced: 0.94 %
Date Time Interrogation Session: 20190828103035
HIGH POWER IMPEDANCE MEASURED VALUE: 50 Ohm
HighPow Impedance: 36 Ohm
Implantable Lead Implant Date: 20140801
Implantable Lead Location: 753860
Implantable Pulse Generator Implant Date: 20140721
Lead Channel Impedance Value: 342 Ohm
Lead Channel Impedance Value: 589 Ohm
Lead Channel Pacing Threshold Pulse Width: 0.4 ms
Lead Channel Pacing Threshold Pulse Width: 0.4 ms
Lead Channel Sensing Intrinsic Amplitude: 2.375 mV
Lead Channel Sensing Intrinsic Amplitude: 2.375 mV
Lead Channel Setting Pacing Amplitude: 2 V
Lead Channel Setting Pacing Pulse Width: 0.4 ms
Lead Channel Setting Sensing Sensitivity: 0.3 mV
MDC IDC LEAD IMPLANT DT: 20030714
MDC IDC LEAD LOCATION: 753859
MDC IDC MSMT LEADCHNL RA PACING THRESHOLD AMPLITUDE: 1 V
MDC IDC MSMT LEADCHNL RA SENSING INTR AMPL: 1.125 mV
MDC IDC MSMT LEADCHNL RA SENSING INTR AMPL: 1.125 mV
MDC IDC MSMT LEADCHNL RV IMPEDANCE VALUE: 456 Ohm
MDC IDC MSMT LEADCHNL RV PACING THRESHOLD AMPLITUDE: 1 V
MDC IDC SET LEADCHNL RV PACING AMPLITUDE: 2.25 V
MDC IDC STAT BRADY AP VS PERCENT: 64.88 %
MDC IDC STAT BRADY AS VP PERCENT: 0.38 %
MDC IDC STAT BRADY AS VS PERCENT: 34.18 %
MDC IDC STAT BRADY RA PERCENT PACED: 63.25 %

## 2018-07-29 ENCOUNTER — Ambulatory Visit (HOSPITAL_COMMUNITY): Payer: Self-pay | Admitting: Pharmacist

## 2018-07-29 LAB — POCT INR: INR: 2.2 (ref 2.0–3.0)

## 2018-08-01 ENCOUNTER — Other Ambulatory Visit (HOSPITAL_COMMUNITY): Payer: Self-pay | Admitting: *Deleted

## 2018-08-01 DIAGNOSIS — Z95811 Presence of heart assist device: Secondary | ICD-10-CM

## 2018-08-01 DIAGNOSIS — Z7901 Long term (current) use of anticoagulants: Secondary | ICD-10-CM

## 2018-08-02 ENCOUNTER — Other Ambulatory Visit (HOSPITAL_COMMUNITY): Payer: Self-pay | Admitting: Adult Health

## 2018-08-02 DIAGNOSIS — I472 Ventricular tachycardia, unspecified: Secondary | ICD-10-CM

## 2018-08-02 DIAGNOSIS — I4892 Unspecified atrial flutter: Secondary | ICD-10-CM

## 2018-08-02 DIAGNOSIS — I471 Supraventricular tachycardia: Secondary | ICD-10-CM

## 2018-08-02 DIAGNOSIS — Z95811 Presence of heart assist device: Secondary | ICD-10-CM

## 2018-08-04 ENCOUNTER — Ambulatory Visit (HOSPITAL_COMMUNITY): Payer: Self-pay | Admitting: Pharmacist

## 2018-08-04 ENCOUNTER — Encounter (HOSPITAL_COMMUNITY): Payer: Self-pay

## 2018-08-04 ENCOUNTER — Ambulatory Visit (HOSPITAL_COMMUNITY)
Admission: RE | Admit: 2018-08-04 | Discharge: 2018-08-04 | Disposition: A | Discharge status: Home / Self Care | Payer: Medicare Other | Source: Ambulatory Visit | Attending: Internal Medicine | Admitting: Internal Medicine

## 2018-08-04 VITALS — BP 85/63 | HR 76 | Ht 72.0 in | Wt 175.6 lb

## 2018-08-04 DIAGNOSIS — D494 Neoplasm of unspecified behavior of bladder: Secondary | ICD-10-CM | POA: Insufficient documentation

## 2018-08-04 DIAGNOSIS — I959 Hypotension, unspecified: Secondary | ICD-10-CM | POA: Insufficient documentation

## 2018-08-04 DIAGNOSIS — G4730 Sleep apnea, unspecified: Secondary | ICD-10-CM | POA: Insufficient documentation

## 2018-08-04 DIAGNOSIS — I255 Ischemic cardiomyopathy: Secondary | ICD-10-CM | POA: Insufficient documentation

## 2018-08-04 DIAGNOSIS — Z7901 Long term (current) use of anticoagulants: Secondary | ICD-10-CM | POA: Diagnosis not present

## 2018-08-04 DIAGNOSIS — E785 Hyperlipidemia, unspecified: Secondary | ICD-10-CM | POA: Insufficient documentation

## 2018-08-04 DIAGNOSIS — I252 Old myocardial infarction: Secondary | ICD-10-CM | POA: Diagnosis not present

## 2018-08-04 DIAGNOSIS — I132 Hypertensive heart and chronic kidney disease with heart failure and with stage 5 chronic kidney disease, or end stage renal disease: Secondary | ICD-10-CM | POA: Diagnosis not present

## 2018-08-04 DIAGNOSIS — I480 Paroxysmal atrial fibrillation: Secondary | ICD-10-CM | POA: Insufficient documentation

## 2018-08-04 DIAGNOSIS — I2510 Atherosclerotic heart disease of native coronary artery without angina pectoris: Secondary | ICD-10-CM | POA: Diagnosis not present

## 2018-08-04 DIAGNOSIS — I471 Supraventricular tachycardia: Secondary | ICD-10-CM | POA: Insufficient documentation

## 2018-08-04 DIAGNOSIS — Z95810 Presence of automatic (implantable) cardiac defibrillator: Secondary | ICD-10-CM | POA: Diagnosis not present

## 2018-08-04 DIAGNOSIS — Z79890 Hormone replacement therapy (postmenopausal): Secondary | ICD-10-CM | POA: Diagnosis not present

## 2018-08-04 DIAGNOSIS — E039 Hypothyroidism, unspecified: Secondary | ICD-10-CM | POA: Diagnosis not present

## 2018-08-04 DIAGNOSIS — Z8673 Personal history of transient ischemic attack (TIA), and cerebral infarction without residual deficits: Secondary | ICD-10-CM | POA: Diagnosis not present

## 2018-08-04 DIAGNOSIS — I472 Ventricular tachycardia, unspecified: Secondary | ICD-10-CM

## 2018-08-04 DIAGNOSIS — N186 End stage renal disease: Secondary | ICD-10-CM | POA: Diagnosis not present

## 2018-08-04 DIAGNOSIS — Z4509 Encounter for adjustment and management of other cardiac device: Secondary | ICD-10-CM | POA: Diagnosis not present

## 2018-08-04 DIAGNOSIS — Z79899 Other long term (current) drug therapy: Secondary | ICD-10-CM | POA: Insufficient documentation

## 2018-08-04 DIAGNOSIS — Z95811 Presence of heart assist device: Secondary | ICD-10-CM | POA: Diagnosis not present

## 2018-08-04 DIAGNOSIS — I5022 Chronic systolic (congestive) heart failure: Secondary | ICD-10-CM | POA: Insufficient documentation

## 2018-08-04 DIAGNOSIS — I454 Nonspecific intraventricular block: Secondary | ICD-10-CM | POA: Diagnosis not present

## 2018-08-04 LAB — BASIC METABOLIC PANEL
Anion gap: 7 (ref 5–15)
BUN: 45 mg/dL — AB (ref 8–23)
CHLORIDE: 106 mmol/L (ref 98–111)
CO2: 22 mmol/L (ref 22–32)
Calcium: 9 mg/dL (ref 8.9–10.3)
Creatinine, Ser: 1.46 mg/dL — ABNORMAL HIGH (ref 0.61–1.24)
GFR calc Af Amer: 51 mL/min — ABNORMAL LOW (ref >60)
GFR calc non Af Amer: 44 mL/min — ABNORMAL LOW (ref >60)
Glucose, Bld: 94 mg/dL (ref 70–99)
POTASSIUM: 4.2 mmol/L (ref 3.5–5.1)
Sodium: 135 mmol/L (ref 135–145)

## 2018-08-04 LAB — PROTIME-INR
INR: 2.22
Prothrombin Time: 24.5 seconds — ABNORMAL HIGH (ref 11.4–15.2)

## 2018-08-04 LAB — CBC
HEMATOCRIT: 35.8 % — AB (ref 39.0–52.0)
HEMOGLOBIN: 11.2 g/dL — AB (ref 13.0–17.0)
MCH: 30.1 pg (ref 26.0–34.0)
MCHC: 31.3 g/dL (ref 30.0–36.0)
MCV: 96.2 fL (ref 78.0–100.0)
Platelets: 160 10*3/uL (ref 150–400)
RBC: 3.72 MIL/uL — AB (ref 4.22–5.81)
RDW: 18.6 % — ABNORMAL HIGH (ref 11.5–15.5)
WBC: 8.2 10*3/uL (ref 4.0–10.5)

## 2018-08-04 LAB — LACTATE DEHYDROGENASE: LDH: 220 U/L — ABNORMAL HIGH (ref 98–192)

## 2018-08-04 NOTE — Progress Notes (Addendum)
Patient ID: [name], male   DOB: [birth year], 78 y.o.   MRN: [id number]   VAD CLINIC PROGRESS NOTE  PCP: Dr. Kandice Robinsons ([place]) GU: Dr Gloriann Loan Neuro Surgeon: Dr Vertell Limber Vascular: Dr Kellie Simmering Primary HF: Dr Darlyn Read [name] is a 77 y.o. male  with h/o VT, PAD and severe systolic HF (EF 85-46%) due to mixed ischemic/nonischemic CM he is s/p HM II LVAD implant for destination therapy on October 19, 2013.VAD implantation was complicated by VT, syncope, AF/Aflutter and RHF. Required short term milrinone.   GU Event 09/01/14 had impacted ureteral stone and underwent cystoscopy and flexible ureteroscopy with lithotripsy and basket extraction.  09/2015 Hematuria and chronic nephrolithiasis. Renal US showed mild hyronephrosis and bilateral renal cysts. Dr. Risa Grill took back for repeat lithotripsy and stone extraction by ureteroscopy and J stent placement. 12/18 Recurrent hematuria. 66mm left ureteral stone. Passed with tamulosin 1/19   Admitted with recurrent hematuria with ureteral stone. Stone removed but found to have large bladder mass which was removed. Course c/b obstructive uropathy and AKI requiring 1 session of HD.  3/19 Bladder chemo stopped after 2 treatments as unable to tolerate 7/12 s/p TURB with right ureteral stent placement.   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  4/2-18 - Episode of melena. Hgb 10.9-> 8.7. EGD/colonoscopy negative 12/18 - Melena with syncope  Hgb 6.5. Transfused 5u. 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  01/2016 R CEA. He was started on Plavix.  01/2016 Lumbar fusion L3-L5 with pedicle screws posterolateral arthrodesis with BMP, allograft  VT events 9/17 with high fevers and productive cough. Treated with levaquin x 7 days for acute bronchitis. Also found to have VT and underwent DC-CV. Seen by EP and amio increased 10/17 speed turned down to 8800 due to RV  failure. 10/18 with slow VT and ADHF.Cardioverted in ED and amio uptitrated.  04/01/18 recurrent VT. Underwent DC-CV in ED 07/09/18 presented to ED. Urgently cardioverted. EP started on Sotolol.   Significant VAD Events  03/2017>percutaneous lead repair   Follow up LVAD Clinic: He returns today for regular follow up. Admitted in early September with recurrent VT requiring DC-CV. EP saw stopped Ranexa and added sotalol on top of amio. Feels OK. A bit fatigued but able to do ADLs. Frequently lightheaded when standing. No falls. Denies palpitations or ICD shocks. Denies orthopnea or PND. No fevers, chills or problems with driveline. No bleeding, melena or neuro symptoms. No VAD alarms. Taking all meds as prescribed.   Medtronic: No AF, VT. Volume status low. Personally reviewed    VAD Indication: Destination Therapy- age excluding    VAD interrogation & Equipment Management: Speed: 8800 Flow:  3.2 Power: 4.2w PI: 5.0  Alarms: 10 low flow Events: 5-20 daily PI events  Fixed speed 8800 Low speed limit: 8200  Primary Controller: Replace back up battery in 26 months Back up controller: Replace back up battery in 57months   Exit Site Care: Drive line is being maintained weekly by [name]. Drive line exit site well healed and incorporated. The velour is fully implanted at exit site. Dressing dry and intact. No erythema or drainage. Stabilization device present and accurately applied. Pt denies fever or chills. Pt given 6 weekly dressing kits at this visit.   I reviewed the LVAD parameters from today, and compared the results to the patient's prior recorded data.  No  programming changes were made.  The LVAD is functioning within specified parameters.  The patient performs LVAD self-test daily.  LVAD interrogation was negative for any significant power changes, alarms or PI events/speed drops.  LVAD equipment check completed and is in good working order.  Back-up equipment present.    LVAD education done on emergency procedures and precautions and reviewed exit site care.    Past Medical History:  Diagnosis Date  . Arthritis   . Automatic implantable cardioverter-defibrillator in situ    a. s/p Medtronic Evera device serial [id number]    . Bradycardia   . CAD (coronary artery disease)    a. s/p MI 1982;  s/p DES to CFX 2011;  s/p NSTEMI 4/12 in setting of VTach;  cath 7/12:  LM ok, LAD mid 30-40%, Dx's with 40%, mCFX stent patent, prox to mid RCA occluded with R to R and L to R collats.  Medical Tx was continued  . Chronic systolic heart failure ([place])    a. s/p LVAD 10/2013 for destination therapy  . CKD (chronic kidney disease)   . CVD (cerebrovascular disease)    a. s/p L CEA 2008  . Cytopenia   . Dysrhythmia    AFIB  . History of GI bleed    a. on ASA- started on plavix during 10/2015 admission for CVA  . HLD (hyperlipidemia)   . HTN (hypertension)   . Hypothyroidism   . Inappropriate therapy from implantable cardioverter-defibrillator    a. ATP for sinus tach SVT wavelet identified SVT but was no  passive (2014)  . Ischemic cardiomyopathy   . 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 L Ear   . Myocardial infarction ([place])    1992  . PAF (paroxysmal atrial fibrillation) ([place])   . PVD (peripheral vascular disease) ([place])    a. 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 Shortness of breath dyspnea    W/ EXERTION   . Sleep apnea    NO CPAP NOW  HE SENT IT BACK YRS AGO  . Stroke [facility])    a. 10/2015 admission   . Ventricular tachycardia ([place])    a. s/p AICD;   h/o ICD shock;  Amiodarone Rx. S/P ablation in 2012    Current Outpatient Medications  Medication Sig Dispense Refill  . amiodarone (PACERONE) 200 MG tablet Take 200 mg by mouth 2 (two) times daily.    [name] atorvastatin (LIPITOR) 80 MG tablet TAKE 1 TABLET DAILY AT 6 PM (Patient taking differently: Take 80 mg by mouth at  bedtime. ) 30 tablet 11  . docusate sodium (COLACE) 100 MG capsule Take 200 mg by mouth daily. Taking 1 per day    . lactose free nutrition (BOOST PLUS) LIQD Take 237 mLs by mouth 3 (three) times daily between meals. 90 Can 0  . levothyroxine (SYNTHROID, LEVOTHROID) 150 MCG tablet Take 1 tablet (150 mcg total) by mouth daily before breakfast. 90 tablet 3  . Multiple Vitamins-Minerals (ICAPS PO) Take 1 tablet by mouth 2 (two) times daily.    . pantoprazole (PROTONIX) 40 MG tablet TAKE 1 TABLET TWICE DAILY (Patient taking differently: Take 40 mg by mouth 2 (two) times daily before a meal. ) 180 tablet 6  . sotalol (BETAPACE) 80 MG tablet Take 1 tablet (80 mg total) by mouth 2 (two) times daily. 60 tablet  6  . temazepam (RESTORIL) 30 MG capsule Take 1 capsule (30 mg total) by mouth at bedtime as needed for sleep. 30 capsule 3  . warfarin (COUMADIN) 2 MG tablet Take 1 tablet (2 mg) daily except 2 tablets (4 mg) on Tuesday    . zinc gluconate 50 MG tablet Take 50 mg by mouth 2 (two) times daily.     . furosemide (LASIX) 20 MG tablet Take 20 mg by mouth daily as needed for fluid.    . promethazine (PHENERGAN) 12.5 MG tablet Take 12.5 mg by mouth every 6 (six) hours as needed for nausea or vomiting.     No current facility-administered medications for this encounter.     Demerol [meperidine]  Review of systems complete and found to be negative unless listed in HPI.    Vitals:   08/04/18 0930 08/04/18 0931  BP: (!) 78/0 (!) 85/63  Pulse:  76  SpO2:  94%  Weight:  79.7 kg (175 lb 9.6 oz)  Height:  6' (1.829 m)    Vital Signs:  Doppler Pressure: 78 Automatc BP:  85/63 (71) HR: 76 SPO2: 94%  Weight: 175.6  lb w/o eqt Last weight: 174.8 lb  Physical exam: General:  Pale. NAD.  HEENT: normal  Neck: supple. JVP not elevated.  Carotids 2+ bilat; no bruits. No lymphadenopathy or thryomegaly appreciated. Cor: LVAD hum.  Lungs: Clear. Abdomen: soft, nontender, non-distended. No  hepatosplenomegaly. No bruits or masses. Good bowel sounds. Driveline site clean. Anchor in place.  Extremities: no cyanosis, clubbing, rash. Warm no edema  Neuro: alert & oriented x 3. No focal deficits. Moves all 4 without problem    ECG Atrial paced QTC 600 (QRS 162) - no change from previous    ASSESSMENT AND PLAN:  1.RecurrentVTwith hypotension and low flow alarms on VAD: Underwent DC-CV 9/17, 08/17/17, and 04/01/18. S/p emergent DC-CV in ER 07/09/18  - ICD interrogated today. No further VT on Sotalol80mg BIDand amio - QTC slightly prolonged but stable. Previously reviewed with Dr. Caryl Comes and felt ok.  - Keep K > 4.0 Mg > 2.0 2. Chronic systolic HF: s/p HM-II LVAD 10/2014.  - Recent RHC in 1/19 with mildly low output. Started on milrinone without benefit.  - Stable NYHA III symptoms - Volume status low. Encouraged him to take more salt and fluids - VAD interrogated personally. Parameters stable. (LVAD speed reduced to 8800 in 10/17 due to RV failure) - s/p previous driveline repair. No pump stops 3. Atrial arrhythmias: Atrial fibrillation, atrial tachycardia.  S/p DC-CV 06/04/14, DC-CV 03/02/15 and 01/19/16.  - Remains in NSR 4. 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 Required HD x1 - Creatinine stable 1.4-1.5 5. 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 Continue coumadin and plavix.(with h/o CVA). - Admitted 4/18 with recurrent GIB. Transfused 2u. EGD/colon negative - Admitted 12./18. hgb 6.5, Transfused 5u. EGD with 2 AVMs treated with APC - Denies bleeding.  - Hgb 11.2 today - Has been getting octreotide infusions but too expensive for him. Will cotninue to  hold for now  6. 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  - Had 2 BCG infusions with Dr. Gloriann Loan but discontinued due to inability to tolerate - 7/12 s/p TURB with right ureteral stent  placement.  7. HTN:  - MAPs low. Will stop sildenafil. Add midodrine as needed 8. LVAD:  -  VAD interrogated personally. Parameters stable. - LDH pending 9. Anticoagulation management:  - Goal INR 2-2.5 INR 2.22. PharmD following. Discussed dosing with PharmD personally. 10. Lumbar compression fracture:  - s/p L3-L5 fusion 02/03/16. - Stable.  11. CVA: -  12/16 with left-sided weakness, now resolved.  Continue atorvastatin. (also warfarin INR goal 2-2.5). S/p R CEA 01/13/16. Plavix stopped 12/18 due to recurrent GIB 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 Forde Dandy in past.  - Amio increased due to recurrent atrial arrhythmias and VT.  - Recent FT4 elevated. Synthroid cut to 150 daily in 12/18.  - WIll need TSH/T3/T4 followed closely.   Total time spent 35 minutes. Over half that time spent discussing above.    Glori Bickers, MD  10:55 AM

## 2018-08-04 NOTE — Progress Notes (Signed)
Patient presents for hospital follow up in [facility] today with caregiver, [name]. Reports no problems with VAD equipment or concerns with drive line exit site.   Pt states that he is feeling ok, but is having orthostatic dizziness/lightheadedness. He denies any falls or syncopal episodes.   Pt admits to drinking less than 2 liters fluid daily (but thinks it is over 1 liter). Appetite varies, but is eating less as well.     HR: BP:  Flow: PI: Power:  Speed: Lying:   75 113/67 (84) 3.3 5.2 4.2   8800 Standing  74 79/65 (72) 2.8 3.8 4.0  Optivol obtained - Dr. Haroldine Laws reviewed.  EKG obtained - Dr. Haroldine Laws reviewed for QT interval (pt started Sotalol 9/19)  Vital Signs:  Doppler Pressure: 78 Automatc BP:  85/63 (71) HR: 76 SPO2: 94%  Weight: 175.6  lb w/o eqt Last weight: 174.8 lb  VAD Indication: Destination Therapy- age excluding    VAD interrogation & Equipment Management: Speed: 8800 Flow:  3.2 Power: 4.2w    PI: 5.0  Alarms: 10 low flow Events: 5-20 daily PI events  Fixed speed 8800 Low speed limit: 8200  Primary Controller: Replace back up battery in 26 months Back up controller: Replace back up battery in 51months  Exit Site Care: Drive line is being maintained weekly by [name]. Drive line exit site well healed and incorporated. The velour is fully implanted at exit site. Dressing dry and intact. No erythema or drainage. Stabilization device present and accurately applied. Pt denies fever or chills. Pt has adequate dressing kits at home.   Significant Events on VAD Support:  02/2015 >>GIB (removed ASA, 2-2.5 INR) 10/2015 >>CVA(Plavix started) 02/2017> GIB (removed ASA, scopes negative) 03/2017> percutaneous lead repair 12/2017> Bladder CA  Device:Medtronic Therapies: on 200 bpm AT monitor: 140 bpm Last check: today  BP & Labs:  MAP 78 - Doppler is reflecting MAP today.  Hgb 11.2- No S/S of bleeding. Specifically denies melena/BRBPR or  nosebleeds. No blood in urine.  LDH pending - with established baseline of 280-380. Denies tea-colored urine. No power elevations noted on interrogation.    Patient Instructions: 1. Stop Sildenafil 2. Return to clinic in one week for BP check with nurse. 3. Return to [facility] in 2 months. [place], PharmD will call you with INR results and coumadin instructions.    Zada Girt RN [place] Coordinator  Office: [phone]  24/7 Pager: [phone]

## 2018-08-04 NOTE — Addendum Note (Signed)
Encounter addended by: Jolaine Artist, MD on: 08/04/2018 11:25 AM  Actions taken: Visit diagnoses modified, LOS modified, Charge Capture section accepted

## 2018-08-07 ENCOUNTER — Other Ambulatory Visit: Payer: Self-pay | Admitting: Cardiology

## 2018-08-08 ENCOUNTER — Telehealth (HOSPITAL_COMMUNITY): Payer: Self-pay | Admitting: *Deleted

## 2018-08-11 NOTE — Telephone Encounter (Signed)
Pt's wife called to report she thinks [name] is "out of rhythm". She reports they sent a remote log to device clinic.   Called device clinic and ask for report. Remote sent at 2:15 this afternoon and pt was A paced, V paced. He did have Aflutter earlier today from 2:00 - 10:00 am. She said he was in [place] yesterday for @ 8 - 10 hours.   Called pt - he says he is weak and tired. VAD parameters: flow 3.0, PI 5.0, power 3.9, speed 8800. Pt denies any VAD alarms, says he has drank @ 24 oz of fluid today; encouraged him to increase his fluid intake today. Confirmed pt is taking Amiodarone 200 mg twice daily and Sotalol 80 mg twice daily. Spoke with [name] and asked if she felt [name] needs to come in, she replied "no, not if he is in rhythm".   Updated Dr. Haroldine Laws - unable to increase any meds. Asked pt/[name] to call VAD pager if symptoms worsen or do not improve and come to [facility] ED. She verbalized understanding of same.  Zada Girt RN, VAD Coordinator VAD pager: [phone]

## 2018-08-12 ENCOUNTER — Ambulatory Visit (HOSPITAL_COMMUNITY): Payer: Self-pay | Admitting: Pharmacist

## 2018-08-12 LAB — POCT INR: INR: 2.1 (ref 2.0–3.0)

## 2018-08-15 ENCOUNTER — Other Ambulatory Visit (HOSPITAL_COMMUNITY): Payer: Self-pay | Admitting: Internal Medicine

## 2018-08-15 ENCOUNTER — Other Ambulatory Visit (HOSPITAL_COMMUNITY): Payer: Self-pay | Admitting: *Deleted

## 2018-08-15 DIAGNOSIS — I255 Ischemic cardiomyopathy: Secondary | ICD-10-CM

## 2018-08-15 DIAGNOSIS — Z95811 Presence of heart assist device: Secondary | ICD-10-CM

## 2018-08-15 MED ORDER — ATORVASTATIN CALCIUM 80 MG PO TABS: 80.0000 mg | ORAL_TABLET | Freq: Every day | ORAL | 1 refills | Status: AC

## 2018-08-18 ENCOUNTER — Other Ambulatory Visit (HOSPITAL_COMMUNITY): Payer: Self-pay | Admitting: Unknown Physician Specialty

## 2018-08-18 DIAGNOSIS — Z95811 Presence of heart assist device: Secondary | ICD-10-CM

## 2018-08-18 DIAGNOSIS — Z7901 Long term (current) use of anticoagulants: Secondary | ICD-10-CM

## 2018-08-19 ENCOUNTER — Other Ambulatory Visit (HOSPITAL_COMMUNITY): Payer: Self-pay | Admitting: Unknown Physician Specialty

## 2018-08-19 ENCOUNTER — Ambulatory Visit (HOSPITAL_COMMUNITY)
Admission: RE | Admit: 2018-08-19 | Discharge: 2018-08-19 | Disposition: A | Discharge status: Home / Self Care | Payer: Medicare Other | Source: Ambulatory Visit | Attending: Cardiology | Admitting: Cardiology

## 2018-08-19 ENCOUNTER — Ambulatory Visit (HOSPITAL_COMMUNITY): Payer: Self-pay | Admitting: Pharmacist

## 2018-08-19 DIAGNOSIS — Z95811 Presence of heart assist device: Secondary | ICD-10-CM | POA: Insufficient documentation

## 2018-08-19 DIAGNOSIS — Z7901 Long term (current) use of anticoagulants: Secondary | ICD-10-CM | POA: Diagnosis present

## 2018-08-19 DIAGNOSIS — I454 Nonspecific intraventricular block: Secondary | ICD-10-CM | POA: Insufficient documentation

## 2018-08-19 DIAGNOSIS — I252 Old myocardial infarction: Secondary | ICD-10-CM | POA: Diagnosis not present

## 2018-08-19 LAB — PROTIME-INR
INR: 2.27
PROTHROMBIN TIME: 24.9 s — AB (ref 11.4–15.2)

## 2018-08-19 LAB — LACTATE DEHYDROGENASE: LDH: 261 U/L — ABNORMAL HIGH (ref 98–192)

## 2018-08-19 NOTE — Progress Notes (Addendum)
Patient presents for nurse visit in [facility] today with caregiver, [name]. Reports no problems with VAD equipment or concerns with drive line exit site.   Pt states that he is feeling slightly better, he his dizziness has decreased slightly since stopping the sildenafil. He denies any falls or syncopal episodes.   Optivol obtained - Dr. Haroldine Laws reviewed.  EKG obtained - Dr. Haroldine Laws reviewed for QT interval and prolonged PR interval.  Vital Signs:  Doppler Pressure: 84 Automatc BP:  96/80 (85) HR: 80  SPO2: 97%  Weight: 175.6  lb w/o eqt Last weight: 174.8 lb  VAD interrogation & Equipment Management: Speed: 8800 Flow:  3.2 Power: 4.1w PI: 5.5  Alarms: 18 low flows all occurred on 10/6-10/7-pt had called office on the 7th w/aflutter. Pt converted on his on. No more Low flows after conversion. Events: 5-10 daily PI events  Fixed speed 8800 Low speed limit: 8200  Plan: 1. No changes to current medications. 2. Return to clinic in 2 months at regular scheduled appointment with Dr. Haroldine Laws.  Tanda Rockers RN, BSN VAD Coordinator 24/7 Pager [phone]

## 2018-08-19 NOTE — Addendum Note (Signed)
Encounter addended by: Christinia Gully, RN on: 08/19/2018 10:37 AM  Actions taken: Sign clinical note

## 2018-08-19 NOTE — Addendum Note (Signed)
Encounter addended by: Christinia Gully, RN on: 08/19/2018 10:45 AM  Actions taken: Sign clinical note

## 2018-08-19 NOTE — Addendum Note (Signed)
Encounter addended by: Christinia Gully, RN on: 08/19/2018 10:01 AM  Actions taken: Vitals modified

## 2018-08-20 LAB — BASIC METABOLIC PANEL
Anion gap: 8 (ref 5–15)
BUN: 47 mg/dL — AB (ref 8–23)
CALCIUM: 9.6 mg/dL (ref 8.9–10.3)
CO2: 24 mmol/L (ref 22–32)
Chloride: 104 mmol/L (ref 98–111)
Creatinine, Ser: 1.55 mg/dL — ABNORMAL HIGH (ref 0.61–1.24)
GFR calc non Af Amer: 41 mL/min — ABNORMAL LOW (ref >60)
GFR, EST AFRICAN AMERICAN: 48 mL/min — AB (ref >60)
Glucose, Bld: 75 mg/dL (ref 70–99)
Potassium: 4.7 mmol/L (ref 3.5–5.1)
SODIUM: 136 mmol/L (ref 135–145)

## 2018-08-26 ENCOUNTER — Telehealth: Payer: Self-pay | Admitting: *Deleted

## 2018-08-26 ENCOUNTER — Ambulatory Visit (HOSPITAL_COMMUNITY): Payer: Self-pay | Admitting: Pharmacist

## 2018-08-26 LAB — POCT INR: INR: 3 (ref 2.0–3.0)

## 2018-08-26 NOTE — Telephone Encounter (Addendum)
Today's ICD transmission reviewed. Optivol fluid index at baseline and stable since late July. No VF/VF. AT episodes noted, 10/13 and 10/14 notable episodes, longest lasting 6.5hrs on 10/13. Presenting rhythm: ApVs.

## 2018-08-26 NOTE — Telephone Encounter (Signed)
Wife called VAD pager to report pt had sent remote ICD reading to [facility] and asked that we contact them for review.   Called device clinic and they confirmed they received remote transmission; instructed to message CV pool: CV heartcaredevice and nurse will review and send [place] a report.  Zada Girt RN VAD Coordinator [phone] 24/7 VAD Pager: [phone]

## 2018-08-28 ENCOUNTER — Other Ambulatory Visit: Payer: Self-pay | Admitting: Urology

## 2018-08-28 ENCOUNTER — Encounter (HOSPITAL_COMMUNITY): Payer: Medicare Other

## 2018-08-29 ENCOUNTER — Other Ambulatory Visit (HOSPITAL_COMMUNITY): Payer: Self-pay | Admitting: Unknown Physician Specialty

## 2018-08-29 DIAGNOSIS — I472 Ventricular tachycardia, unspecified: Secondary | ICD-10-CM

## 2018-08-29 DIAGNOSIS — I4892 Unspecified atrial flutter: Secondary | ICD-10-CM

## 2018-08-29 DIAGNOSIS — Z95811 Presence of heart assist device: Secondary | ICD-10-CM

## 2018-08-29 DIAGNOSIS — I471 Supraventricular tachycardia: Secondary | ICD-10-CM

## 2018-08-29 MED ORDER — AMIODARONE HCL 200 MG PO TABS: 200.0000 mg | ORAL_TABLET | Freq: Two times a day (BID) | ORAL | 5 refills | Status: AC

## 2018-09-02 ENCOUNTER — Ambulatory Visit (HOSPITAL_COMMUNITY): Payer: Self-pay | Admitting: Pharmacist

## 2018-09-02 LAB — POCT INR: INR: 2.7 (ref 2.0–3.0)

## 2018-09-08 ENCOUNTER — Inpatient Hospital Stay (HOSPITAL_COMMUNITY): Admission: RE | Admit: 2018-09-08 | Payer: Medicare Other | Source: Ambulatory Visit

## 2018-09-09 ENCOUNTER — Ambulatory Visit (HOSPITAL_COMMUNITY): Payer: Self-pay | Admitting: Pharmacist

## 2018-09-09 LAB — POCT INR: INR: 2.2 (ref 2.0–3.0)

## 2018-09-16 ENCOUNTER — Ambulatory Visit (HOSPITAL_COMMUNITY): Payer: Self-pay | Admitting: Pharmacist

## 2018-09-16 DIAGNOSIS — C672 Malignant neoplasm of lateral wall of bladder: Secondary | ICD-10-CM | POA: Diagnosis present

## 2018-09-16 LAB — POCT INR: INR: 2.5 (ref 2.0–3.0)

## 2018-09-16 NOTE — H&P (Addendum)
Advanced Heart Failure VAD History and Physical Note   PCP-Cardiologist: No primary care provider on file.   Reason for Admission: Heparin bridge for tumor resection  HPI:    [name] is a 77 y.o. male  with h/o VT, PAD and severe systolic HF (EF 94-17%) due to mixed ischemic/nonischemic CM. He is s/p HM II LVAD implant for destination therapy on October 19, 2013. VAD implantation was complicated by VT, syncope, AF/Aflutter and RHF. Required short term milrinone.   GU Event 09/01/14 had impacted ureteral stone and underwent cystoscopy and flexible ureteroscopy with lithotripsy and basket extraction.  09/2015 Hematuria and chronic nephrolithiasis. Renal US showed mild hyronephrosis and bilateral renal cysts. Dr. Risa Grill took back for repeat lithotripsy and stone extraction by ureteroscopy and J stent placement. 12/18 Recurrent hematuria. 41mm left ureteral stone. Passed with tamulosin 1/19   Admitted with recurrent hematuria with ureteral stone. Stone removed but found to have large bladder mass which was removed. Course c/b obstructive uropathy and AKI requiring 1 session of HD.  3/19 Bladder chemo stopped after 2 treatments as unable to tolerate 7/12 s/p TURB with right ureteral stent placement.   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  4/2-18 - Episode of melena. Hgb 10.9-> 8.7. EGD/colonoscopy negative 12/18 - Melena with syncope  Hgb 6.5. Transfused 5u. 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  01/2016 R CEA. He was started on Plavix.  01/2016 Lumbar fusion L3-L5 with pedicle screws posterolateral arthrodesis with BMP, allograft  VT events 9/17 with high fevers and productive cough. Treated with levaquin x 7 days for acute bronchitis. Also found to have VT and underwent DC-CV. Seen by EP and amio increased 10/17 speed turned down to 8800 due to RV failure. 10/18 with slow VT and  ADHF.Cardioverted in ED and amio uptitrated.  04/01/18 recurrent VT. Underwent DC-CV in ED 07/09/18 presented to ED. Urgently cardioverted. EP started on Sotolol.  Significant VAD Events  03/2017>percutaneous lead repair  He had an episode of atrial flutter 08/11/18 with associated low flows. ICD transmission showed that he converted spontaneously. No medication changes were made. He was seen in LVAD clinic by VAD coordinator 10/15. He was doing well. No low flows since episode of flutter.   He is being admitted today for heparin bridge for recurrent bladder tumor resection on 09/19/18 with Dr Gloriann Loan.  He feels fine. Denies and dizziness or syncope. No bleeding. Denies orthopnea, PND, or edema. INR 2.33 Labs pending.  LVAD INTERROGATION:  HeartMate II LVAD:  Flow 2.9 liters/min, speed 8800, power 4.2, PI 6.7.  Low flows on 11/9, 11/4, and 10/31. 9 PI events/24 hours. Multiple low flows on 10/31 and 11/4    Review of Systems: [y] = yes, [ ]  = no   General: Weight gain [ ] ; Weight loss [ ] ; Anorexia [ ] ; Fatigue [ ] ; Fever [ ] ; Chills [ ] ; Weakness [ ]   Cardiac: Chest pain/pressure [ ] ; Resting SOB [ ] ; Exertional SOB [ ] ; Orthopnea [ ] ; Pedal Edema [ ] ; Palpitations [ ] ; Syncope [ ] ; Presyncope [ ] ; Paroxysmal nocturnal dyspnea[ ]   Pulmonary: Cough [ ] ; Wheezing[ ] ; Hemoptysis[ ] ; Sputum [ ] ; Snoring [ ]   GI: Vomiting[ ] ; Dysphagia[ ] ; Melena[ ] ; Hematochezia [ ] ; Heartburn[ ] ; Abdominal pain [ ] ; Constipation [ ] ; Diarrhea [ ] ; BRBPR [ ]   GU: Hematuria[ ] ; Dysuria [ ] ;  Nocturia[ ]   Vascular: Pain in legs with walking [ ] ; Pain in feet with lying flat [ ] ; Non-healing sores [ ] ; Stroke [ ] ; TIA [ ] ; Slurred speech [ ] ;  Neuro: Headaches[ ] ; Vertigo[ ] ; Seizures[ ] ; Paresthesias[ ] ;Blurred vision [ ] ; Diplopia [ ] ; Vision changes [ ]   Ortho/Skin: Arthritis Blue.Reese ]; Joint pain Blue.Reese ]; Muscle pain [ ] ; Joint swelling [ ] ; Back Pain [ ] ; Rash [ ]   Psych: Depression[ ] ; Anxiety[ ]   Heme: Bleeding  problems [ ] ; Clotting disorders [ ] ; Anemia [ ]   Endocrine: Diabetes [ ] ; Thyroid dysfunction[y ]    Home Medications Prior to Admission medications   Medication Sig Start Date End Date Taking? Authorizing Provider  amiodarone (PACERONE) 200 MG tablet Take 1 tablet (200 mg total) by mouth 2 (two) times daily. 08/29/18  Yes Ambra Haverstick, Shaune Pascal, MD  atorvastatin (LIPITOR) 80 MG tablet Take 1 tablet (80 mg total) by mouth at bedtime. 08/15/18  Yes Cale Decarolis, Shaune Pascal, MD  docusate sodium (COLACE) 100 MG capsule Take 200 mg by mouth daily.    Yes [provider]  furosemide (LASIX) 20 MG tablet Take 20 mg by mouth daily as needed for fluid.   Yes [provider]  levothyroxine (SYNTHROID, LEVOTHROID) 150 MCG tablet Take 1 tablet (150 mcg total) by mouth daily before breakfast. 10/25/17  Yes Shanika Levings, Shaune Pascal, MD  Multiple Vitamin (MULTIVITAMIN WITH MINERALS) TABS tablet Take 1 tablet by mouth daily.   Yes [provider]  Multiple Vitamins-Minerals (ICAPS PO) Take 1 tablet by mouth 2 (two) times daily.   Yes [provider]  pantoprazole (PROTONIX) 40 MG tablet TAKE 1 TABLET TWICE DAILY Patient taking differently: Take 40 mg by mouth 2 (two) times daily before a meal.  05/05/18  Yes Renuka Farfan, Shaune Pascal, MD  promethazine (PHENERGAN) 12.5 MG tablet Take 12.5 mg by mouth every 6 (six) hours as needed for nausea or vomiting.   Yes [provider]  protein supplement shake (PREMIER PROTEIN) LIQD Take 11 oz by mouth 2 (two) times daily.   Yes [provider]  sotalol (BETAPACE) 80 MG tablet Take 1 tablet (80 mg total) by mouth 2 (two) times daily. 07/14/18  Yes Georgiana Shore, NP  temazepam (RESTORIL) 30 MG capsule Take 1 capsule (30 mg total) by mouth at bedtime as needed for sleep. 06/30/18  Yes Tenya Araque, Shaune Pascal, MD  warfarin (COUMADIN) 2 MG tablet Take 2 mg by mouth every evening.    Yes [provider]  zinc gluconate 50 MG tablet Take  50 mg by mouth 2 (two) times daily.    Yes [provider]  lactose free nutrition (BOOST PLUS) LIQD Take 237 mLs by mouth 3 (three) times daily between meals. Patient not taking: Reported on 09/15/2018 07/14/18   Georgiana Shore, NP    Past Medical History: Past Medical History:  Diagnosis Date  . Arthritis   . Automatic implantable cardioverter-defibrillator in situ    a. s/p Medtronic Evera device serial [id number]    . Bradycardia   . CAD (coronary artery disease)    a. s/p MI 1982;  s/p DES to CFX 2011;  s/p NSTEMI 4/12 in setting of VTach;  cath 7/12:  LM ok, LAD mid 30-40%, Dx's with 40%, mCFX stent patent, prox to mid RCA occluded with R to R and L to R collats.  Medical Tx was continued  . Chronic systolic heart failure ([place])    a.  s/p LVAD 10/2013 for destination therapy  . CKD (chronic kidney disease)   . CVD (cerebrovascular disease)    a. s/p L CEA 2008  . Cytopenia   . Dysrhythmia    AFIB  . History of GI bleed    a. on ASA- started on plavix during 10/2015 admission for CVA  . HLD (hyperlipidemia)   . HTN (hypertension)   . Hypothyroidism   . Inappropriate therapy from implantable cardioverter-defibrillator    a. ATP for sinus tach SVT wavelet identified SVT but was no  passive (2014)  . Ischemic cardiomyopathy   . 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 L Ear   . Myocardial infarction ([place])    1992  . PAF (paroxysmal atrial fibrillation) ([place])   . PVD (peripheral vascular disease) ([place])    a. 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 Shortness of breath dyspnea    W/ EXERTION   . Sleep apnea    NO CPAP NOW  HE SENT IT BACK YRS AGO  . Stroke [facility])    a. 10/2015 admission   . Ventricular tachycardia ([place])    a. s/p AICD;   h/o ICD shock;  Amiodarone Rx. S/P ablation in 2012    Past Surgical History: Past Surgical History:  Procedure Laterality Date  . BACK SURGERY      . CARDIAC CATHETERIZATION     "several" (05/25/2013)  . CARDIAC DEFIBRILLATOR PLACEMENT  2003; 2005; 05/25/2013   2014: Medtronic Evera device serial number [id number]  . CARDIAC ELECTROPHYSIOLOGY STUDY AND ABLATION  2012  . CARDIOVERSION N/A 10/15/2013   Procedure: CARDIOVERSION;  Surgeon: Jolaine Artist, MD;  Location: [place];  Service: Cardiovascular;  Laterality: N/A;  . CARDIOVERSION N/A 11/04/2013   Procedure: CARDIOVERSION;  Surgeon: Larey Dresser, MD;  Location: [place];  Service: Cardiovascular;  Laterality: N/A;  . CARDIOVERSION N/A 06/04/2014   Procedure: CARDIOVERSION;  Surgeon: Jolaine Artist, MD;  Location: [facility] ENDOSCOPY;  Service: Cardiovascular;  Laterality: N/A;  . CARDIOVERSION N/A 03/02/2015   Procedure: CARDIOVERSION;  Surgeon: Jolaine Artist, MD;  Location: [facility] ENDOSCOPY;  Service: Cardiovascular;  Laterality: N/A;  . CARDIOVERSION N/A 01/19/2016   Procedure: CARDIOVERSION;  Surgeon: Larey Dresser, MD;  Location: [place];  Service: Cardiovascular;  Laterality: N/A;  . CARDIOVERSION N/A 06/10/2018   Procedure: CARDIOVERSION;  Surgeon: Jolaine Artist, MD;  Location: [facility] ENDOSCOPY;  Service: Cardiovascular;  Laterality: N/A;  . CARDIOVERSION N/A 07/02/2018   Procedure: CARDIOVERSION;  Surgeon: Jolaine Artist, MD;  Location: [place];  Service: Cardiovascular;  Laterality: N/A;  . CAROTID ENDARTERECTOMY Left ~ 2007  . CATARACT EXTRACTION W/ INTRAOCULAR LENS  IMPLANT, BILATERAL Bilateral 2000  . CHOLECYSTECTOMY  12/15/?2010  . COLONOSCOPY WITH PROPOFOL N/A 02/02/202018   Procedure: COLONOSCOPY WITH PROPOFOL;  Surgeon: Ronald Lobo, MD;  Location: [place];  Service: Endoscopy;  Laterality: N/A;  . CORONARY ANGIOPLASTY  1992  . CORONARY ANGIOPLASTY WITH STENT PLACEMENT     "last one was 11/2012  (05/25/2013)  . CYSTOSCOPY W/ RETROGRADES Bilateral 05/16/2018   Procedure: CYSTOSCOPY WITH RETROGRADE PYELOGRAM;  Surgeon: Lucas Mallow, MD;   Location: [place];  Service: Urology;  Laterality: Bilateral;  . CYSTOSCOPY WITH RETROGRADE PYELOGRAM, URETEROSCOPY AND STENT PLACEMENT Left 08/09/2014   Procedure: CYSTOSCOPY WITH RETROGRADE PYELOGRAM, URETEROSCOPY AND STENT PLACEMENT;  Surgeon: Bernestine Amass, MD;  Location: [facility];  Service: Urology;  Laterality: Left;  . CYSTOSCOPY WITH RETROGRADE PYELOGRAM, URETEROSCOPY AND STENT PLACEMENT Left 09/01/2014   Procedure: CYSTOSCOPY WITH URETEROSCOPY AND STENT REMOVAL;  Surgeon: Bernestine Amass, MD;  Location: [facility];  Service: Urology;  Laterality: Left;  . CYSTOSCOPY WITH RETROGRADE PYELOGRAM, URETEROSCOPY AND STENT PLACEMENT Left 09/20/2014   Procedure: CYSTOSCOPY WITH RETROGRADE PYELOGRAM, URETEROSCOPY AND STENT PLACEMENT, stone removal;  Surgeon: Bernestine Amass, MD;  Location: [facility];  Service: Urology;  Laterality: Left;  . CYSTOSCOPY WITH RETROGRADE PYELOGRAM, URETEROSCOPY AND STENT PLACEMENT Bilateral 12/04/2017   Procedure: CYSTOSCOPY WITH BILATERAL RETROGRADE PYELOGRAM, LEFT URETEROSCOPY AND STENT PLACEMENT;  Surgeon: Lucas Mallow, MD;  Location: [place];  Service: Urology;  Laterality: Bilateral;  . DOPPLER ECHOCARDIOGRAPHY  2011  . ELBOW ARTHROSCOPY Right 1990's  . ELECTROPHYSIOLOGIC STUDY N/A 07/19/2016   Procedure: Cardioversion;  Surgeon: Evans Lance, MD;  Location: [place] CV LAB;  Service: Cardiovascular;  Laterality: N/A;  . ENDARTERECTOMY Right 01/13/2016   Procedure: RIGHT CAROTID ARTERY ENDARTERECTOMY;  Surgeon: Mal Misty, MD;  Location: [place];  Service: Vascular;  Laterality: Right;  . ENTEROSCOPY Left 02/23/2017   Procedure: ENTEROSCOPY;  Surgeon: Ronald Lobo, MD;  Location: [facility] ENDOSCOPY;  Service: Endoscopy;  Laterality: Left;  . ENTEROSCOPY N/A 10/17/2017   Procedure: ENTEROSCOPY;  Surgeon: Yetta Flock, MD;  Location: [facility] ENDOSCOPY;  Service: Gastroenterology;  Laterality: N/A;  LVAD pt.    . ESOPHAGOGASTRODUODENOSCOPY N/A 02/15/2015   Procedure:  ESOPHAGOGASTRODUODENOSCOPY (EGD);  Surgeon: Carol Ada, MD;  Location: [facility] ENDOSCOPY;  Service: Endoscopy;  Laterality: N/A;  LVAD  . EYE SURGERY     VITRECTOMY  LEFT 05/2012  . FEMORAL-POPLITEAL BYPASS GRAFT Right 2011  . FEMORAL-POPLITEAL BYPASS GRAFT Left 05/2002   Archie Endo 05/06/2002 (05/25/2013)  . HEEL SPUR SURGERY Right 1990's  . HOLMIUM LASER APPLICATION Left 17/51/0258   Procedure: HOLMIUM LASER APPLICATION;  Surgeon: Bernestine Amass, MD;  Location: [facility];  Service: Urology;  Laterality: Left;  . HOLMIUM LASER APPLICATION Left 04/01/7823   Procedure: HOLMIUM LASER APPLICATION;  Surgeon: Lucas Mallow, MD;  Location: [place];  Service: Urology;  Laterality: Left;  . IMPLANTABLE CARDIOVERTER DEFIBRILLATOR GENERATOR CHANGE N/A 05/25/2013   Procedure: IMPLANTABLE CARDIOVERTER DEFIBRILLATOR GENERATOR CHANGE;  Surgeon: Deboraha Sprang, MD;  Location: [facility] CATH LAB;  Service: Cardiovascular;  Laterality: N/A;  . INSERTION OF IMPLANTABLE LEFT VENTRICULAR ASSIST DEVICE N/A 10/19/2013   Procedure: INSERTION OF IMPLANTABLE LEFT VENTRICULAR ASSIST DEVICE;  Surgeon: Gaye Pollack, MD;  Location: [place];  Service: Open Heart Surgery;  Laterality: N/A;  CIRC ARREST  NITRIC OXIDE  MEDTRONIC ICD  . INTRAOPERATIVE TRANSESOPHAGEAL ECHOCARDIOGRAM N/A 10/19/2013   Procedure: INTRAOPERATIVE TRANSESOPHAGEAL ECHOCARDIOGRAM;  Surgeon: Gaye Pollack, MD;  Location: [facility] OR;  Service: Open Heart Surgery;  Laterality: N/A;  . IR FLUORO GUIDE CV LINE RIGHT  12/10/2017  . IR US GUIDE VASC ACCESS RIGHT  12/10/2017  . KNEE ARTHROSCOPY Bilateral 1990's  . LEAD REVISION N/A 06/05/2013   Procedure: LEAD REVISION;  Surgeon: Thompson Grayer, MD;  Location: [facility] CATH LAB;  Service: Cardiovascular;  Laterality: N/A;  . LEFT HEART CATHETERIZATION WITH CORONARY ANGIOGRAM N/A 11/24/2012   Procedure: LEFT HEART CATHETERIZATION WITH CORONARY ANGIOGRAM;  Surgeon: Peter M Martinique, MD;  Location: [place] CATH LAB;  Service: Cardiovascular;  Laterality:  N/A;  . LIPOMA EXCISION Left 2013   "near ear" (05/25/2013)  . LUMBAR DISC  SURGERY  1974; 2000's  . PATCH ANGIOPLASTY Right 01/13/2016   Procedure: WITH  PATCH ANGIOPLASTY;  Surgeon: Mal Misty, MD;  Location: [place];  Service: Vascular;  Laterality: Right;  . RENAL ARTERY BYPASS Bilateral 2003  . REVASCULARIZATION / IN-SITU GRAFT LEG    . RIGHT HEART CATH N/A 11/29/2017   Procedure: RIGHT HEART CATH;  Surgeon: Jolaine Artist, MD;  Location: [place] CV LAB;  Service: Cardiovascular;  Laterality: N/A;  . RIGHT HEART CATHETERIZATION N/A 09/17/2013   Procedure: RIGHT HEART CATH;  Surgeon: Jolaine Artist, MD;  Location: [facility] CATH LAB;  Service: Cardiovascular;  Laterality: N/A;  . RIGHT HEART CATHETERIZATION N/A 10/14/2013   Procedure: RIGHT HEART CATH;  Surgeon: Jolaine Artist, MD;  Location: [facility] CATH LAB;  Service: Cardiovascular;  Laterality: N/A;  . RIGHT HEART CATHETERIZATION N/A 11/16/2013   Procedure: RIGHT HEART CATH;  Surgeon: Jolaine Artist, MD;  Location: [facility] CATH LAB;  Service: Cardiovascular;  Laterality: N/A;  . RIGHT HEART CATHETERIZATION N/A 07/13/2014   Procedure: RIGHT HEART CATH;  Surgeon: Jolaine Artist, MD;  Location: [facility] CATH LAB;  Service: Cardiovascular;  Laterality: N/A;  . TEE WITHOUT CARDIOVERSION N/A 11/04/2013   Procedure: TRANSESOPHAGEAL ECHOCARDIOGRAM (TEE);  Surgeon: Larey Dresser, MD;  Location: [place];  Service: Cardiovascular;  Laterality: N/A;  . TEE WITHOUT CARDIOVERSION N/A 03/02/2015   Procedure: TRANSESOPHAGEAL ECHOCARDIOGRAM (TEE);  Surgeon: Jolaine Artist, MD;  Location: [facility] ENDOSCOPY;  Service: Cardiovascular;  Laterality: N/A;  . TEE WITHOUT CARDIOVERSION N/A 01/19/2016   Procedure: TRANSESOPHAGEAL ECHOCARDIOGRAM (TEE);  Surgeon: Larey Dresser, MD;  Location: [place];  Service: Cardiovascular;  Laterality: N/A;  . TONSILLECTOMY  1946  . TRANSURETHRAL RESECTION OF BLADDER TUMOR N/A 12/04/2017   Procedure: TRANSURETHRAL  RESECTION OF BLADDER TUMOR (TURBT);  Surgeon: Lucas Mallow, MD;  Location: [place];  Service: Urology;  Laterality: N/A;  . TRANSURETHRAL RESECTION OF BLADDER TUMOR N/A 05/16/2018   Procedure: TRANSURETHRAL RESECTION OF BLADDER TUMOR (TURBT);  Surgeon: Lucas Mallow, MD;  Location: [place];  Service: Urology;  Laterality: N/A;  . TUMOR EXCISION Left 1960's   "fatty tumor" (05/25/2013)  . V-TACH ABLATION N/A 09/12/2011   Procedure: V-TACH ABLATION;  Surgeon: Evans Lance, MD;  Location: [facility] CATH LAB;  Service: Cardiovascular;  Laterality: N/A;  . V-TACH ABLATION N/A 06/08/2013   Procedure: V-TACH ABLATION;  Surgeon: Evans Lance, MD;  Location: [facility] CATH LAB;  Service: Cardiovascular;  Laterality: N/A;    Family History: Family History  Problem Relation Age of Onset  . Heart attack Mother   . Coronary artery disease Mother   . Cancer Mother   . Heart disease Mother   . Hyperlipidemia Mother   . Hypertension Mother   . Coronary artery disease Father   . Stroke Father   . Cancer Father   . Heart disease Father        before age 67  . Hyperlipidemia Father   . Hypertension Father   . Heart attack Father   . Coronary artery disease Unknown     Social History: Social History   Socioeconomic History  . Marital status: Divorced    Spouse name: Not on file  . Number of children: 2  . Years of education: Not on file  . Highest education level: Not on file  Occupational History  . Occupation: retired    Comment: Health visitor  . Occupation: Retired    Comment: [employer]  . Occupation: Part-time    Comment: Security at [employer]  . Financial resource strain: Not on file  . Food insecurity:    Worry: Not on file    Inability: Not on file  . Transportation needs:    Medical: Not on file    Non-medical: Not on file  Tobacco Use  . Smoking status: Former Smoker    Packs/day: 0.50    Years: 37.00    Pack years: 18.50    Types: Cigarettes     Last attempt to quit: 11/05/2001    Years since quitting: 16.8  . Smokeless tobacco: Never Used  Substance and Sexual Activity  . Alcohol use: Yes    Alcohol/week: 0.0 standard drinks    Comment: 05/25/2013 "mixed drink couple times/month"  . Drug use: No  . Sexual activity: Never  Lifestyle  . Physical activity:    Days per week: Not on file    Minutes per session: Not on file  . Stress: Not on file  Relationships  . Social connections:    Talks on phone: Not on file    Gets together: Not on file    Attends religious service: Not on file    Active member of club or organization: Not on file    Attends meetings of clubs or organizations: Not on file    Relationship status: Not on file  Other Topics Concern  . Not on file  Social History Narrative   Lives with sig other.    Allergies:  Allergies  Allergen Reactions  . Demerol [Meperidine] Other (See Comments)    Paralysis/ Could only move eyes    Objective:    Mean arterial Pressure 89  Physical Exam    General:  No resp difficulty HEENT: Normal Neck: supple. JVP flat. Carotids 2+ bilat; no bruits. No lymphadenopathy or thyromegaly appreciated. Cor: Mechanical heart sounds with LVAD hum present. Lungs: Clear Abdomen: soft, nontender, nondistended. No hepatosplenomegaly. No bruits or masses. Good bowel sounds. Driveline: C/D/I; securement device intact and driveline incorporated Extremities: no cyanosis, clubbing, rash, edema Neuro: alert & orientedx3, cranial nerves grossly intact. moves all 4 extremities w/o difficulty. Affect pleasant  Telemetry   NSR 70s. Personally reviewed.   EKG   Pending  Labs    Basic Metabolic Panel: No results for input(s): NA, K, CL, CO2, GLUCOSE, BUN, CREATININE, CALCIUM, MG, PHOS in the last 168 hours.  Liver Function Tests: No results for input(s): AST, ALT, ALKPHOS, BILITOT, PROT, ALBUMIN in the last 168 hours. No results for input(s): LIPASE, AMYLASE in the last 168  hours. No results for input(s): AMMONIA in the last 168 hours.  CBC: No results for input(s): WBC, NEUTROABS, HGB, HCT, MCV, PLT in the last 168 hours.  Cardiac Enzymes: No results for input(s): CKTOTAL, CKMB, CKMBINDEX, TROPONINI in the last 168 hours.  BNP: BNP (last 3 results) No results for input(s): BNP in the last 8760 hours.  ProBNP (last 3 results) No results for input(s): PROBNP in the last 8760 hours.   CBG: No results for input(s): GLUCAP in the last 168 hours.  Coagulation Studies: Recent Labs    09/16/18  INR 2.5    Imaging     No results found.    Patient Profile:   [name] is a 77 y.o. male  with h/o VT, PAD and severe systolic HF (EF 97-98%) due to mixed ischemic/nonischemic CM. He is s/p HM II LVAD implant for destination therapy  on October 19, 2013. VAD implantation was complicated by VT, syncope, AF/Aflutter and RHF. Required short term milrinone.   Admitted for heparin bridge for bladder tumor resection on 09/19/18.  Assessment/Plan:    1. 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  - Had 2 BCG infusions with Dr. Gloriann Loan but discontinued due to inability to tolerate - 7/12 s/p TURB with right ureteral stent placement.  - Plan for recurrent bladder tumor resection 09/19/18 with Dr Gloriann Loan. Coumadin on hold.  2. Chronic systolic HF: s/p HM-II LVAD 10/2014.  - Recent RHC in 1/19 with mildly low output. Started on milrinone without benefit.  - Stable NYHA III symptoms - Volume status stable to dry.  - VAD interrogated personally. Several low flow alarms. Will do ramp echo tomorrow at 2 pm. May need to decrease speed again.  (LVAD speed reduced to 8800 in 10/17 due to RV failure) - s/p previous driveline repair. No pump stops - Coumadin on hold for surgery as above. Will start heparin when INR <1.8. 3.RecurrentVTwith hypotension and low flow alarms on VAD:  Underwent DC-CV 9/17, 08/17/17, and 04/01/18. S/p emergent DC-CV in ER 07/09/18  - Had several low flow alarms on 11/9, 11/4, and 10/31. ICD shows several runs of AT 10/24-10/28, all lasting about an hour. Nothing that correlates with low flow alarms.  - No further VT on Sotalol80mg BIDand amio - QTC slightly prolonged but stable. Previously reviewed with Dr. Caryl Comes and felt ok.  - Keep K > 4.0 Mg > 2.0 4. Atrial arrhythmias: Atrial fibrillation, atrial tachycardia.  S/p DC-CV 06/04/14, DC-CV 03/02/15 and 01/19/16.  - Remains in NSR 5. 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 Required HD x1 - Creatinine baseline 1.4-1.5 - Monitor daily BMET. Pending today.  6. 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 Continue coumadin and plavix.(with h/o CVA). - Admitted 4/18 with recurrent GIB. Transfused 2u. EGD/colon negative - Admitted 12./18. hgb 6.5, Transfused 5u. EGD with 2 AVMs treated with APC - Denies bleeding.  - CBC pending.  - Has been getting octreotide infusions but too expensive for him. Will cotninue to hold for now  7. HTN:  - MAPs 80s. No longer on sildenafil. Can add midodrine if needed.  8. LVAD:  - VAD interrogated personally. Parameters stable. - LDH pending.  9. Anticoagulation management:  - Goal INR 2-2.5 typically. Start heparin when INR <1.8 as above 10. Lumbar compression fracture:  - s/p L3-L5 fusion 02/03/16. No change.  11. CVA: - 12/16 with left-sided weakness, now resolved.  Continue atorvastatin. (also on warfarin INR goal 2-2.5). S/p R CEA 01/13/16. Plavix stopped 12/18 due to recurrent GIB 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 Forde Dandy in past.  - Amio increased due to recurrent atrial arrhythmias and VT.  - Recent FT4 elevated. Synthroid cut to 150 daily in 12/18.  - WIll need TSH/T3/T4 followed closely. Check tomorrow.  13. Poor venous access - Place PICC line  I reviewed the LVAD parameters from  today, and compared the results to the patient's prior recorded data.  No programming changes were made.  The LVAD is functioning within specified parameters.  The patient performs LVAD self-test daily.  LVAD interrogation was negative for any significant power changes, alarms or PI events/speed drops.  LVAD equipment check completed and is in good working order.  Back-up equipment present.  LVAD education done on emergency procedures and precautions and reviewed exit site care.  Length of Stay: [place], NP 09/16/2018, 4:05 PM  VAD Team Pager [phone] (7am - 7am) +++VAD ISSUES ONLY+++   Advanced Heart Failure Team Pager [phone] (M-F; 7a - 4p)  Please contact [facility] for night-coverage after hours (4p -7a ) and weekends on [email or web address] for all non- LVAD Issues  Patient seen and examined with the above-signed Advanced Practice Provider and/or Housestaff. I personally reviewed laboratory data, imaging studies and relevant notes. I independently examined the patient and formulated the important aspects of the plan. I have edited the note to reflect any of my changes or salient points. I have personally discussed the plan with the patient and/or family.  He presents for heparin bridge for resection of recurrent bladder tumor. INR 2.33 currently. Start heparin when INR < 2.0. He has multiple low flow alarms on VAD from several days ago. Creatinine up today. Likely volume depletion. Encouraged po intake. Can give IVFs as needed. We interrogated ICD at bedside and multiple runs atrial tach up to 1 hour long. Continue amio. Plan ramp echo tomorrow.   Glori Bickers, MD  4:47 PM

## 2018-09-17 ENCOUNTER — Other Ambulatory Visit (HOSPITAL_COMMUNITY): Payer: Medicare Other

## 2018-09-17 ENCOUNTER — Other Ambulatory Visit: Payer: Self-pay

## 2018-09-17 ENCOUNTER — Inpatient Hospital Stay (HOSPITAL_COMMUNITY): Payer: Medicare Other

## 2018-09-17 ENCOUNTER — Encounter (HOSPITAL_COMMUNITY): Payer: Self-pay | Admitting: *Deleted

## 2018-09-17 ENCOUNTER — Inpatient Hospital Stay: Payer: Self-pay

## 2018-09-17 ENCOUNTER — Inpatient Hospital Stay (HOSPITAL_COMMUNITY)
Admission: RE | Admit: 2018-09-17 | Discharge: 2018-10-01 | DRG: 668 | Disposition: A | Discharge status: Home / Self Care | Payer: Medicare Other | Source: Ambulatory Visit | Attending: Internal Medicine | Admitting: Internal Medicine

## 2018-09-17 DIAGNOSIS — Z95811 Presence of heart assist device: Secondary | ICD-10-CM | POA: Diagnosis not present

## 2018-09-17 DIAGNOSIS — N179 Acute kidney failure, unspecified: Secondary | ICD-10-CM

## 2018-09-17 DIAGNOSIS — E032 Hypothyroidism due to medicaments and other exogenous substances: Secondary | ICD-10-CM | POA: Diagnosis present

## 2018-09-17 DIAGNOSIS — C672 Malignant neoplasm of lateral wall of bladder: Principal | ICD-10-CM | POA: Diagnosis present

## 2018-09-17 DIAGNOSIS — Z452 Encounter for adjustment and management of vascular access device: Secondary | ICD-10-CM

## 2018-09-17 DIAGNOSIS — Z79899 Other long term (current) drug therapy: Secondary | ICD-10-CM

## 2018-09-17 DIAGNOSIS — I472 Ventricular tachycardia: Secondary | ICD-10-CM | POA: Diagnosis not present

## 2018-09-17 DIAGNOSIS — I959 Hypotension, unspecified: Secondary | ICD-10-CM | POA: Diagnosis present

## 2018-09-17 DIAGNOSIS — E039 Hypothyroidism, unspecified: Secondary | ICD-10-CM | POA: Diagnosis present

## 2018-09-17 DIAGNOSIS — T462X5A Adverse effect of other antidysrhythmic drugs, initial encounter: Secondary | ICD-10-CM | POA: Diagnosis present

## 2018-09-17 DIAGNOSIS — I132 Hypertensive heart and chronic kidney disease with heart failure and with stage 5 chronic kidney disease, or end stage renal disease: Secondary | ICD-10-CM | POA: Diagnosis present

## 2018-09-17 DIAGNOSIS — I471 Supraventricular tachycardia: Secondary | ICD-10-CM

## 2018-09-17 DIAGNOSIS — N170 Acute kidney failure with tubular necrosis: Secondary | ICD-10-CM | POA: Diagnosis present

## 2018-09-17 DIAGNOSIS — C679 Malignant neoplasm of bladder, unspecified: Secondary | ICD-10-CM

## 2018-09-17 DIAGNOSIS — D62 Acute posthemorrhagic anemia: Secondary | ICD-10-CM | POA: Diagnosis not present

## 2018-09-17 DIAGNOSIS — I739 Peripheral vascular disease, unspecified: Secondary | ICD-10-CM | POA: Diagnosis present

## 2018-09-17 DIAGNOSIS — C670 Malignant neoplasm of trigone of bladder: Secondary | ICD-10-CM | POA: Diagnosis not present

## 2018-09-17 DIAGNOSIS — N132 Hydronephrosis with renal and ureteral calculous obstruction: Secondary | ICD-10-CM | POA: Diagnosis not present

## 2018-09-17 DIAGNOSIS — I2510 Atherosclerotic heart disease of native coronary artery without angina pectoris: Secondary | ICD-10-CM | POA: Diagnosis present

## 2018-09-17 DIAGNOSIS — Z955 Presence of coronary angioplasty implant and graft: Secondary | ICD-10-CM

## 2018-09-17 DIAGNOSIS — I5022 Chronic systolic (congestive) heart failure: Secondary | ICD-10-CM | POA: Diagnosis present

## 2018-09-17 DIAGNOSIS — Z981 Arthrodesis status: Secondary | ICD-10-CM

## 2018-09-17 DIAGNOSIS — Z95810 Presence of automatic (implantable) cardiac defibrillator: Secondary | ICD-10-CM

## 2018-09-17 DIAGNOSIS — M1990 Unspecified osteoarthritis, unspecified site: Secondary | ICD-10-CM | POA: Diagnosis not present

## 2018-09-17 DIAGNOSIS — Z87891 Personal history of nicotine dependence: Secondary | ICD-10-CM

## 2018-09-17 DIAGNOSIS — I4719 Other supraventricular tachycardia: Secondary | ICD-10-CM | POA: Diagnosis present

## 2018-09-17 DIAGNOSIS — I447 Left bundle-branch block, unspecified: Secondary | ICD-10-CM | POA: Diagnosis present

## 2018-09-17 DIAGNOSIS — I480 Paroxysmal atrial fibrillation: Secondary | ICD-10-CM | POA: Diagnosis not present

## 2018-09-17 DIAGNOSIS — Z7901 Long term (current) use of anticoagulants: Secondary | ICD-10-CM

## 2018-09-17 DIAGNOSIS — I252 Old myocardial infarction: Secondary | ICD-10-CM

## 2018-09-17 DIAGNOSIS — R339 Retention of urine, unspecified: Secondary | ICD-10-CM | POA: Diagnosis not present

## 2018-09-17 DIAGNOSIS — R310 Gross hematuria: Secondary | ICD-10-CM | POA: Diagnosis present

## 2018-09-17 DIAGNOSIS — Z85820 Personal history of malignant melanoma of skin: Secondary | ICD-10-CM

## 2018-09-17 DIAGNOSIS — E785 Hyperlipidemia, unspecified: Secondary | ICD-10-CM | POA: Diagnosis not present

## 2018-09-17 DIAGNOSIS — D499 Neoplasm of unspecified behavior of unspecified site: Secondary | ICD-10-CM

## 2018-09-17 DIAGNOSIS — I428 Other cardiomyopathies: Secondary | ICD-10-CM | POA: Diagnosis present

## 2018-09-17 DIAGNOSIS — N1330 Unspecified hydronephrosis: Secondary | ICD-10-CM

## 2018-09-17 DIAGNOSIS — G4730 Sleep apnea, unspecified: Secondary | ICD-10-CM | POA: Diagnosis present

## 2018-09-17 DIAGNOSIS — Z8673 Personal history of transient ischemic attack (TIA), and cerebral infarction without residual deficits: Secondary | ICD-10-CM

## 2018-09-17 DIAGNOSIS — Z885 Allergy status to narcotic agent status: Secondary | ICD-10-CM

## 2018-09-17 LAB — TYPE AND SCREEN
ABO/RH(D): O POS
Antibody Screen: NEGATIVE

## 2018-09-17 LAB — COMPREHENSIVE METABOLIC PANEL WITH GFR
ALT: 33 U/L (ref 0–44)
AST: 36 U/L (ref 15–41)
Albumin: 3.3 g/dL — ABNORMAL LOW (ref 3.5–5.0)
Alkaline Phosphatase: 172 U/L — ABNORMAL HIGH (ref 38–126)
Anion gap: 7 (ref 5–15)
BUN: 63 mg/dL — ABNORMAL HIGH (ref 8–23)
CO2: 22 mmol/L (ref 22–32)
Calcium: 8.8 mg/dL — ABNORMAL LOW (ref 8.9–10.3)
Chloride: 105 mmol/L (ref 98–111)
Creatinine, Ser: 2.16 mg/dL — ABNORMAL HIGH (ref 0.61–1.24)
GFR calc Af Amer: 32 mL/min — ABNORMAL LOW (ref >60)
GFR calc non Af Amer: 28 mL/min — ABNORMAL LOW (ref >60)
Glucose, Bld: 131 mg/dL — ABNORMAL HIGH (ref 70–99)
Potassium: 4.3 mmol/L (ref 3.5–5.1)
Sodium: 134 mmol/L — ABNORMAL LOW (ref 135–145)
Total Bilirubin: 0.6 mg/dL (ref 0.3–1.2)
Total Protein: 7.1 g/dL (ref 6.5–8.1)

## 2018-09-17 LAB — CBC WITH DIFFERENTIAL/PLATELET
Abs Immature Granulocytes: 0.03 10*3/uL (ref 0.00–0.07)
BASOS ABS: 0.1 10*3/uL (ref 0.0–0.1)
Basophils Relative: 1 %
EOS PCT: 5 %
Eosinophils Absolute: 0.3 10*3/uL (ref 0.0–0.5)
HEMATOCRIT: 35.8 % — AB (ref 39.0–52.0)
Hemoglobin: 11 g/dL — ABNORMAL LOW (ref 13.0–17.0)
IMMATURE GRANULOCYTES: 1 %
LYMPHS ABS: 1 10*3/uL (ref 0.7–4.0)
LYMPHS PCT: 15 %
MCH: 29.6 pg (ref 26.0–34.0)
MCHC: 30.7 g/dL (ref 30.0–36.0)
MCV: 96.2 fL (ref 80.0–100.0)
Monocytes Absolute: 0.9 10*3/uL (ref 0.1–1.0)
Monocytes Relative: 14 %
NEUTROS ABS: 4.3 10*3/uL (ref 1.7–7.7)
NRBC: 0 % (ref 0.0–0.2)
Neutrophils Relative %: 64 %
Platelets: 149 10*3/uL — ABNORMAL LOW (ref 150–400)
RBC: 3.72 MIL/uL — ABNORMAL LOW (ref 4.22–5.81)
RDW: 16.5 % — ABNORMAL HIGH (ref 11.5–15.5)
WBC: 6.6 10*3/uL (ref 4.0–10.5)

## 2018-09-17 LAB — LACTATE DEHYDROGENASE: LDH: 249 U/L — AB (ref 98–192)

## 2018-09-17 LAB — MAGNESIUM: Magnesium: 2.1 mg/dL (ref 1.7–2.4)

## 2018-09-17 LAB — MRSA PCR SCREENING: MRSA by PCR: NEGATIVE

## 2018-09-17 LAB — PROTIME-INR
INR: 2.33
Prothrombin Time: 25.2 s — ABNORMAL HIGH (ref 11.4–15.2)

## 2018-09-17 MED ORDER — PROMETHAZINE HCL 25 MG PO TABS
12.5000 mg | ORAL_TABLET | Freq: Four times a day (QID) | ORAL | Status: DC | PRN
  Filled 2018-09-17: qty 1

## 2018-09-17 MED ORDER — ZINC GLUCONATE 50 MG PO TABS: 50.0000 mg | ORAL_TABLET | Freq: Two times a day (BID) | ORAL | Status: DC

## 2018-09-17 MED ORDER — ZINC SULFATE 220 (50 ZN) MG PO CAPS
220.0000 mg | ORAL_CAPSULE | Freq: Every day | ORAL | Status: DC
  Administered 2018-09-18 – 2018-10-01 (×13): 220 mg via ORAL
  Filled 2018-09-17 (×14): qty 1

## 2018-09-17 MED ORDER — BOOST PLUS PO LIQD
237.0000 mL | Freq: Three times a day (TID) | ORAL | Status: DC
  Administered 2018-09-18 – 2018-09-19 (×4): 237 mL via ORAL
  Filled 2018-09-17 (×10): qty 237

## 2018-09-17 MED ORDER — ATORVASTATIN CALCIUM 80 MG PO TABS
80.0000 mg | ORAL_TABLET | Freq: Every day | ORAL | Status: DC
  Administered 2018-09-17 – 2018-09-30 (×14): 80 mg via ORAL
  Filled 2018-09-17 (×14): qty 1

## 2018-09-17 MED ORDER — ADULT MULTIVITAMIN W/MINERALS CH
1.0000 | ORAL_TABLET | Freq: Every day | ORAL | Status: DC
  Administered 2018-09-18 – 2018-10-01 (×13): 1 via ORAL
  Filled 2018-09-17 (×13): qty 1

## 2018-09-17 MED ORDER — DOCUSATE SODIUM 100 MG PO CAPS
200.0000 mg | ORAL_CAPSULE | Freq: Every day | ORAL | Status: DC
  Administered 2018-09-18 – 2018-10-01 (×6): 200 mg via ORAL
  Filled 2018-09-17 (×13): qty 2

## 2018-09-17 MED ORDER — PANTOPRAZOLE SODIUM 40 MG PO TBEC
40.0000 mg | DELAYED_RELEASE_TABLET | Freq: Two times a day (BID) | ORAL | Status: DC
  Administered 2018-09-17 – 2018-10-01 (×28): 40 mg via ORAL
  Filled 2018-09-17 (×28): qty 1

## 2018-09-17 MED ORDER — AMIODARONE HCL 200 MG PO TABS
200.0000 mg | ORAL_TABLET | Freq: Two times a day (BID) | ORAL | Status: DC
  Administered 2018-09-17 – 2018-10-01 (×28): 200 mg via ORAL
  Filled 2018-09-17 (×28): qty 1

## 2018-09-17 MED ORDER — TEMAZEPAM 15 MG PO CAPS
30.0000 mg | ORAL_CAPSULE | Freq: Every evening | ORAL | Status: DC | PRN
  Administered 2018-09-17 – 2018-09-30 (×14): 30 mg via ORAL
  Filled 2018-09-17 (×14): qty 2

## 2018-09-17 MED ORDER — LEVOTHYROXINE SODIUM 150 MCG PO TABS
150.0000 ug | ORAL_TABLET | Freq: Every day | ORAL | Status: DC
  Administered 2018-09-18 – 2018-10-01 (×14): 150 ug via ORAL
  Filled 2018-09-17 (×14): qty 1

## 2018-09-17 MED ORDER — SODIUM CHLORIDE 0.9% FLUSH: 10.0000 mL | INTRAVENOUS | Status: DC | PRN

## 2018-09-17 MED ORDER — PREMIER PROTEIN SHAKE: 11.0000 [oz_av] | Freq: Two times a day (BID) | ORAL | Status: DC

## 2018-09-17 MED ORDER — ENSURE ENLIVE PO LIQD
237.0000 mL | Freq: Two times a day (BID) | ORAL | Status: DC
  Administered 2018-09-18 (×2): 237 mL via ORAL

## 2018-09-17 MED ORDER — SOTALOL HCL 80 MG PO TABS
80.0000 mg | ORAL_TABLET | Freq: Two times a day (BID) | ORAL | Status: DC
  Administered 2018-09-17 – 2018-10-01 (×28): 80 mg via ORAL
  Filled 2018-09-17 (×28): qty 1

## 2018-09-17 MED ORDER — ACETAMINOPHEN 325 MG PO TABS: 650.0000 mg | ORAL_TABLET | ORAL | Status: DC | PRN

## 2018-09-17 MED ORDER — SODIUM CHLORIDE 0.9% FLUSH
10.0000 mL | Freq: Two times a day (BID) | INTRAVENOUS | Status: DC
  Administered 2018-09-18 – 2018-09-21 (×7): 10 mL
  Administered 2018-09-22: 20 mL
  Administered 2018-09-22 – 2018-09-24 (×5): 10 mL
  Administered 2018-09-25: 30 mL
  Administered 2018-09-25 – 2018-09-26 (×2): 10 mL
  Administered 2018-09-27: 20 mL
  Administered 2018-09-28 (×2): 10 mL
  Administered 2018-09-28: 20 mL
  Administered 2018-09-29 – 2018-09-30 (×4): 10 mL

## 2018-09-17 NOTE — Progress Notes (Signed)
Peripherally Inserted Central Catheter/Midline Placement  The IV Nurse has discussed with the patient and/or persons authorized to consent for the patient, the purpose of this procedure and the potential benefits and risks involved with this procedure.  The benefits include less needle sticks, lab draws from the catheter, and the patient may be discharged home with the catheter. Risks include, but not limited to, infection, bleeding, blood clot (thrombus formation), and puncture of an artery; nerve damage and irregular heartbeat and possibility to perform a PICC exchange if needed/ordered by physician.  Alternatives to this procedure were also discussed.  Bard Power PICC patient education guide, fact sheet on infection prevention and patient information card has been provided to patient /or left at bedside.    PICC/Midline Placement Documentation  PICC Double Lumen 97/98/92 PICC Right Basilic 42 cm 0 cm (Active)  Indication for Insertion or Continuance of Line Vasoactive infusions;Chronic illness with exacerbations (CF, Sickle Cell, etc.) 09/17/2018  9:29 PM  Exposed Catheter (cm) 0 cm 09/17/2018  9:29 PM  Site Assessment Clean;Dry;Intact 09/17/2018  9:29 PM  Lumen #1 Status Flushed;Saline locked;Blood return noted 09/17/2018  9:29 PM  Lumen #2 Status Flushed;Saline locked;Blood return noted 09/17/2018  9:29 PM  Dressing Type Transparent 09/17/2018  9:29 PM  Dressing Status Clean;Dry;Intact;Antimicrobial disc in place 09/17/2018  9:29 PM  Dressing Change Due 09/24/18 09/17/2018  9:29 PM       Gordan Payment 09/17/2018, 9:31 PM

## 2018-09-17 NOTE — Progress Notes (Signed)
ANTICOAGULATION CONSULT NOTE - Initial Consult  Pharmacy Consult for heparin when INR <1.8 Indication: LVAD  Allergies  Allergen Reactions  . Demerol [Meperidine] Other (See Comments)    Paralysis/ Could only move eyes    Patient Measurements: Height: 6' (182.9 cm) Weight: 170 lb 8 oz (77.3 kg) IBW/kg (Calculated) : 77.6 Heparin Dosing Weight: 77kg  Vital Signs: Temp: 97.7 F (36.5 C) (11/13 1255) Temp Source: Oral (11/13 1255) BP: 69/54 (11/13 1255)  Labs: Recent Labs    09/16/18 09/17/18 1343  HGB  --  11.0*  HCT  --  35.8*  PLT  --  149*  LABPROT  --  25.2*  INR 2.5 2.33  CREATININE  --  2.16*    Estimated Creatinine Clearance: 30.8 mL/min (A) (by C-G formula based on SCr of 2.16 mg/dL (H)).   Medical History: Past Medical History:  Diagnosis Date  . Arthritis   . Automatic implantable cardioverter-defibrillator in situ    a. s/p Medtronic Evera device serial [id number]    . Bradycardia   . CAD (coronary artery disease)    a. s/p MI 1982;  s/p DES to CFX 2011;  s/p NSTEMI 4/12 in setting of VTach;  cath 7/12:  LM ok, LAD mid 30-40%, Dx's with 40%, mCFX stent patent, prox to mid RCA occluded with R to R and L to R collats.  Medical Tx was continued  . Chronic systolic heart failure ([place])    a. s/p LVAD 10/2013 for destination therapy  . CKD (chronic kidney disease)   . CVD (cerebrovascular disease)    a. s/p L CEA 2008  . Cytopenia   . Dysrhythmia    AFIB  . History of GI bleed    a. on ASA- started on plavix during 10/2015 admission for CVA  . HLD (hyperlipidemia)   . HTN (hypertension)   . Hypothyroidism   . Inappropriate therapy from implantable cardioverter-defibrillator    a. ATP for sinus tach SVT wavelet identified SVT but was no  passive (2014)  . Ischemic cardiomyopathy   . 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 L Ear   . Myocardial infarction ([place])    1992  . PAF (paroxysmal atrial fibrillation) ([place])   . PVD (peripheral vascular disease) ([place])     a. 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 Shortness of breath dyspnea    W/ EXERTION   . Sleep apnea    NO CPAP NOW  HE SENT IT BACK YRS AGO  . Stroke [facility])    a. 10/2015 admission   . Ventricular tachycardia ([place])    a. s/p AICD;   h/o ICD shock;  Amiodarone Rx. S/P ablation in 2012    Medications:    Assessment: 77 year old male with history of LVAD implant being admitted for heparin bridge in anticipation of bladder tumor resection on Friday.   INR is currently 2.3 this afternoon, will hold heparin for now and recheck in am.   Goal of Therapy:  Heparin level 0.3-0.5 units/ml Monitor platelets by anticoagulation protocol: Yes   Plan:  Hold heparin tonight INR in am  Erin Hearing PharmD., BCPS Clinical Pharmacist 09/17/2018 3:41 PM

## 2018-09-17 NOTE — Progress Notes (Signed)
RN notified by IV Team that PICC is ready to use. Will continue to monitor pt for any changes.

## 2018-09-17 NOTE — Progress Notes (Signed)
LVAD Coordinator Rounding Note:  Admitted on 09/17/18 for heparin bridge for bladder tumor resection scheduled on Friday 11/15.   Per Dr. Haroldine Laws- will place PICC line for access.  HM II Implanted 10/19/13 by Dr. Cyndia Bent under Destination Therapy criteria.   Vital signs: Temp: 97.7 HR: 77 Doppler Pressure:88 Automatic BP: O2 Sat:  on RA  Weight: 77.3kg  LVAD interrogation reveals:  Speed: 8800 Flow:3.0 Power: 4.0w PI: 5.1  Alarms: 11/9: 36 Low flows  11/8: 3 LF  11/7 1 LF  11/4 14 LF  10/31- 4 LF  Patient reports "same PO intake (64oz a day) that he has always drank" Instructed patient he needs to increase PO intake. We will RAMP echo him tomorrow afternoon.   Events:5-15 PI events daily  Fixed speed: 8800 Low speed limit: 8200  Drive Line: Sorbaview dressing dry and intact; anchor intact and accurately applied. Weekly dressing changes.May be changed by [facility], or bedside nurse.  Labs:  LDH trend: 261  INR trend:  2.5  Creat trend: 1.55  Anticoagulation Plan: - INR Goal: 2.0 - 2.5 - ASA Dose: no ASA (removed 02/2017 due to GI bleed)  Device: -Dual Medtronic ICD -Therapies: on at 200 bpm - Last device check 10/02/17  Significant Events on VAD Support:  09/01/14>>ureteral stone with cystoscopy and flexible ureteroscopy with lithotripsy and basket extraction 02/2015 >>GIB with 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 Started on Plavix 3/17>>Lumbar fusion L3-L5 9/17>>VT with DC-CV; amio increased 10/17>>ramp with RV failure; speed decreased to 8800 02/2017> GIB (removed ASA, scopes negative) 03/2017>percutaneous lead repair after pump stops  10/18>>Slow VT. Cardioverted in ED and amiodarone uptitrated 12/18>>melena with scope. Hgb 6.5. EGD with 2 AVMs with APC 12/18>>recurrent  hematuria. 6 mm left ureteral stone 1/19>>7 mm left ureteral stone removal with laser lithotripsy; left ureteral stent. Resection of 6 cm bladder tumor    Plan/Recommendations:  1. Please call VAD pager if any issues with VAD equipment or questions, concerns. 2. Will RAMP echo tomorrow afternoon at 2pm. 3. Place PICC line for IV access.    Emerson Monte RN [place] Coordinator  Office: [phone]  24/7 Pager: [phone]

## 2018-09-18 ENCOUNTER — Inpatient Hospital Stay (HOSPITAL_COMMUNITY): Payer: Medicare Other

## 2018-09-18 ENCOUNTER — Encounter (HOSPITAL_COMMUNITY): Payer: Self-pay

## 2018-09-18 ENCOUNTER — Other Ambulatory Visit (HOSPITAL_COMMUNITY): Payer: Medicare Other

## 2018-09-18 DIAGNOSIS — C670 Malignant neoplasm of trigone of bladder: Secondary | ICD-10-CM | POA: Diagnosis not present

## 2018-09-18 DIAGNOSIS — I340 Nonrheumatic mitral (valve) insufficiency: Secondary | ICD-10-CM

## 2018-09-18 DIAGNOSIS — C672 Malignant neoplasm of lateral wall of bladder: Secondary | ICD-10-CM | POA: Diagnosis not present

## 2018-09-18 DIAGNOSIS — Z95811 Presence of heart assist device: Secondary | ICD-10-CM

## 2018-09-18 DIAGNOSIS — I5022 Chronic systolic (congestive) heart failure: Secondary | ICD-10-CM | POA: Diagnosis not present

## 2018-09-18 DIAGNOSIS — I471 Supraventricular tachycardia: Secondary | ICD-10-CM | POA: Diagnosis not present

## 2018-09-18 DIAGNOSIS — C679 Malignant neoplasm of bladder, unspecified: Secondary | ICD-10-CM | POA: Diagnosis not present

## 2018-09-18 LAB — BASIC METABOLIC PANEL
ANION GAP: 7 (ref 5–15)
Anion gap: 6 (ref 5–15)
BUN: 55 mg/dL — ABNORMAL HIGH (ref 8–23)
BUN: 51 mg/dL — ABNORMAL HIGH (ref 8–23)
CHLORIDE: 105 mmol/L (ref 98–111)
CHLORIDE: 106 mmol/L (ref 98–111)
CO2: 22 mmol/L (ref 22–32)
CO2: 22 mmol/L (ref 22–32)
CREATININE: 2.18 mg/dL — AB (ref 0.61–1.24)
CREATININE: 2.19 mg/dL — AB (ref 0.61–1.24)
Calcium: 8.9 mg/dL (ref 8.9–10.3)
Calcium: 8.8 mg/dL — ABNORMAL LOW (ref 8.9–10.3)
GFR calc Af Amer: 32 mL/min — ABNORMAL LOW (ref >60)
GFR calc non Af Amer: 27 mL/min — ABNORMAL LOW (ref >60)
GFR calc non Af Amer: 27 mL/min — ABNORMAL LOW (ref >60)
GFR, EST AFRICAN AMERICAN: 31 mL/min — AB (ref >60)
Glucose, Bld: 99 mg/dL (ref 70–99)
Glucose, Bld: 121 mg/dL — ABNORMAL HIGH (ref 70–99)
POTASSIUM: 4.4 mmol/L (ref 3.5–5.1)
Potassium: 4.3 mmol/L (ref 3.5–5.1)
SODIUM: 134 mmol/L — AB (ref 135–145)
Sodium: 134 mmol/L — ABNORMAL LOW (ref 135–145)

## 2018-09-18 LAB — CBC
HEMATOCRIT: 33.6 % — AB (ref 39.0–52.0)
HEMOGLOBIN: 10.6 g/dL — AB (ref 13.0–17.0)
MCH: 29.8 pg (ref 26.0–34.0)
MCHC: 31.5 g/dL (ref 30.0–36.0)
MCV: 94.4 fL (ref 80.0–100.0)
NRBC: 0 % (ref 0.0–0.2)
Platelets: 130 10*3/uL — ABNORMAL LOW (ref 150–400)
RBC: 3.56 MIL/uL — AB (ref 4.22–5.81)
RDW: 16.4 % — ABNORMAL HIGH (ref 11.5–15.5)
WBC: 6.5 10*3/uL (ref 4.0–10.5)

## 2018-09-18 LAB — PROTIME-INR
INR: 2.18
INR: 2.26
INR: 2.14
Prothrombin Time: 24 seconds — ABNORMAL HIGH (ref 11.4–15.2)
Prothrombin Time: 24.7 seconds — ABNORMAL HIGH (ref 11.4–15.2)
Prothrombin Time: 23.6 seconds — ABNORMAL HIGH (ref 11.4–15.2)

## 2018-09-18 LAB — ECHOCARDIOGRAM COMPLETE
HEIGHTINCHES: 72 in
WEIGHTICAEL: 2694.9 [oz_av]

## 2018-09-18 LAB — T4, FREE: FREE T4: 1.06 ng/dL (ref 0.82–1.77)

## 2018-09-18 LAB — LACTATE DEHYDROGENASE: LDH: 219 U/L — AB (ref 98–192)

## 2018-09-18 LAB — TSH: TSH: 9.181 u[IU]/mL — AB (ref 0.350–4.500)

## 2018-09-18 MED ORDER — PHYTONADIONE 5 MG PO TABS
2.5000 mg | ORAL_TABLET | Freq: Once | ORAL | Status: AC
  Administered 2018-09-18: 2.5 mg via ORAL
  Filled 2018-09-18: qty 1

## 2018-09-18 MED ORDER — ENSURE ENLIVE PO LIQD: 237.0000 mL | Freq: Two times a day (BID) | ORAL | Status: DC

## 2018-09-18 MED ORDER — SODIUM CHLORIDE 0.9 % IV BOLUS
500.0000 mL | Freq: Once | INTRAVENOUS | Status: AC
  Administered 2018-09-18: 500 mL via INTRAVENOUS

## 2018-09-18 NOTE — Progress Notes (Signed)
ANTICOAGULATION CONSULT NOTE - Initial Consult  Pharmacy Consult for heparin when INR <2 Indication: LVAD  Allergies  Allergen Reactions  . Demerol [Meperidine] Other (See Comments)    Paralysis/ Could only move eyes    Patient Measurements: Height: 6' (182.9 cm) Weight: 168 lb 6.9 oz (76.4 kg) IBW/kg (Calculated) : 77.6 Heparin Dosing Weight: 77kg  Vital Signs: Temp: 97.4 F (36.3 C) (11/14 1922) Temp Source: Oral (11/14 1922) BP: 85/63 (11/14 1922) Pulse Rate: 75 (11/14 1922)  Labs: Recent Labs    09/17/18 1343 09/18/18 0359 09/18/18 1338 09/18/18 2106  HGB 11.0* 10.6*  --   --   HCT 35.8* 33.6*  --   --   PLT 149* 130*  --   --   LABPROT 25.2* 24.0* 24.7* 23.6*  INR 2.33 2.18 2.26 2.14  CREATININE 2.16* 2.18* 2.19*  --     Estimated Creatinine Clearance: 30 mL/min (A) (by C-G formula based on SCr of 2.19 mg/dL (H)).   Medical History: Past Medical History:  Diagnosis Date  . Arthritis   . Automatic implantable cardioverter-defibrillator in situ    a. s/p Medtronic Evera device serial [id number]    . Bradycardia   . CAD (coronary artery disease)    a. s/p MI 1982;  s/p DES to CFX 2011;  s/p NSTEMI 4/12 in setting of VTach;  cath 7/12:  LM ok, LAD mid 30-40%, Dx's with 40%, mCFX stent patent, prox to mid RCA occluded with R to R and L to R collats.  Medical Tx was continued  . Chronic systolic heart failure ([place])    a. s/p LVAD 10/2013 for destination therapy  . CKD (chronic kidney disease)   . CVD (cerebrovascular disease)    a. s/p L CEA 2008  . Cytopenia   . Dysrhythmia    AFIB  . History of GI bleed    a. on ASA- started on plavix during 10/2015 admission for CVA  . HLD (hyperlipidemia)   . HTN (hypertension)   . Hypothyroidism   . Inappropriate therapy from implantable cardioverter-defibrillator    a. ATP for sinus tach SVT wavelet identified SVT but was no  passive (2014)  . Ischemic cardiomyopathy   . 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 L Ear   .  Myocardial infarction ([place])    1992  . PAF (paroxysmal atrial fibrillation) ([place])   . PVD (peripheral vascular disease) ([place])    a. 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 Shortness of breath dyspnea    W/ EXERTION   . Sleep apnea    NO CPAP NOW  HE SENT IT BACK YRS AGO  . Stroke [facility])    a. 10/2015 admission   . Ventricular tachycardia ([place])    a. s/p AICD;   h/o ICD shock;  Amiodarone Rx. S/P ablation in 2012   Assessment: 77 year old male with history of LVAD implant being admitted for heparin bridge in anticipation of bladder tumor resection on 11/15.   INR this evening came back at 2.14. Pt did receive vitamin K 2.5 mg tonight. Patient started Ensure Alive which has some vitamin K.     Goal of Therapy:  INR 2.0-2.5 Heparin level 0.3-0.5 units/ml Monitor platelets by anticoagulation protocol: Yes   Plan:  Start heparin when INR <2.0 Recheck INR with AM labs Monitor  for signs/symptoms of bleeding  Antonietta Jewel, PharmD Clinical Pharmacist  Pager: [phone] Phone: [phone] Please use AMION for clinical pharmacists numbers  09/18/2018      9:54 PM

## 2018-09-18 NOTE — Progress Notes (Signed)
LVAD Coordinator Rounding Note:  Admitted on 09/17/18 for heparin bridge for bladder tumor resection scheduled on Friday 11/15.   HM II Implanted 10/19/13 by Dr. Cyndia Bent under Destination Therapy criteria.  Patient sitting up in recliner eating breakfast. States he feels fine this morning. Encouraged him to make sure he is taking in enough fluids today. His flow is 2.8-3.1 this AM. He states he has been drinking his gatorade.   Plan for renal ultrasound and RAMP echo today. OR tomorrow with Dr. Gloriann Loan for recurrent bladder tumor resection  Vital signs: Temp: 97.7 HR: 75 Doppler Pressure:60 Automatic BP:86/70 (76) O2 Sat:  on RA  Weight: 77.3>76.4kg  LVAD interrogation reveals:  Speed: 8800 Flow:2.9 Power: 4.1w PI: 4.6  Alarms: no alarms overnight  11/9: 36 Low flows  11/8: 3 LF  11/7 1 LF  11/4 14 LF  10/31- 4 LF   Events:4 PI this morning  Fixed speed: 8800 Low speed limit: 8200  Drive Line: Sorbaview dressing dry and intact; anchor intact and accurately applied. Weekly dressing changes.May be changed by [facility], or bedside nurse.  Labs:  LDH trend: 261>219  INR trend:  2.5>2.18  Creat trend: 1.55>2.18  Anticoagulation Plan: - INR Goal: 2.0 - 2.5 - ASA Dose: no ASA (removed 02/2017 due to GI bleed)  Device: -Dual Medtronic ICD -Therapies: on at 200 bpm - Last device check 10/02/17  Significant Events on VAD Support:  09/01/14>>ureteral stone with cystoscopy and flexible ureteroscopy with lithotripsy and basket extraction 02/2015 >>GIB with 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 Started on Plavix 3/17>>Lumbar fusion L3-L5 9/17>>VT with DC-CV; amio increased 10/17>>ramp with RV failure; speed decreased to 8800 02/2017> GIB (removed ASA, scopes negative) 03/2017>percutaneous lead  repair after pump stops  10/18>>Slow VT. Cardioverted in ED and amiodarone uptitrated 12/18>>melena with scope. Hgb 6.5. EGD with 2 AVMs with APC 12/18>>recurrent hematuria. 6 mm left ureteral stone 1/19>>7 mm left ureteral stone removal with laser lithotripsy; left ureteral stent. Resection of 6 cm bladder tumor    Plan/Recommendations:  1. Please call VAD pager if any issues with VAD equipment or questions, concerns. 2. RAMP echo today at 2pm. 3. VAD coordinator will accompany patient to OR tomorrow for bladder tumor resection.     Emerson Monte RN [place] Coordinator  Office: [phone]  24/7 Pager: [phone]

## 2018-09-18 NOTE — Progress Notes (Signed)
Speed  Flow  PI  Power  LVIDD  AI  Aortic openings  MR  TR  Septum  RV   8800 3.2  6.8 4.1  None 0/5  Trivial Trivial To the left Mod Hk  8600  3.1 6.5 3.9 5.0 None 0/5 Trivial Trivial To the left Mod Hk  8400  3.1 6.2 3.8  None 0/5 Trivial Trivial Midline/slight right Mod Hk                                          Doppler MAP: 82   Ramp ECHO performed at bedside.  At completion of ramp study, patients primary and back up controller programmed:  Fixed speed: 8600 Low speed limit: 8000

## 2018-09-18 NOTE — Progress Notes (Signed)
ANTICOAGULATION CONSULT NOTE - Initial Consult  Pharmacy Consult for heparin when INR <1.8 Indication: LVAD  Allergies  Allergen Reactions  . Demerol [Meperidine] Other (See Comments)    Paralysis/ Could only move eyes    Patient Measurements: Height: 6' (182.9 cm) Weight: 168 lb 6.9 oz (76.4 kg) IBW/kg (Calculated) : 77.6 Heparin Dosing Weight: 77kg  Vital Signs: Temp: 97.8 F (36.6 C) (11/14 1135) Temp Source: Oral (11/14 1135) BP: 96/75 (11/14 1135) Pulse Rate: 75 (11/14 1135)  Labs: Recent Labs    09/17/18 1343 09/18/18 0359 09/18/18 1338  HGB 11.0* 10.6*  --   HCT 35.8* 33.6*  --   PLT 149* 130*  --   LABPROT 25.2* 24.0* 24.7*  INR 2.33 2.18 2.26  CREATININE 2.16* 2.18*  --     Estimated Creatinine Clearance: 30.2 mL/min (A) (by C-G formula based on SCr of 2.18 mg/dL (H)).   Medical History: Past Medical History:  Diagnosis Date  . Arthritis   . Automatic implantable cardioverter-defibrillator in situ    a. s/p Medtronic Evera device serial [id number]    . Bradycardia   . CAD (coronary artery disease)    a. s/p MI 1982;  s/p DES to CFX 2011;  s/p NSTEMI 4/12 in setting of VTach;  cath 7/12:  LM ok, LAD mid 30-40%, Dx's with 40%, mCFX stent patent, prox to mid RCA occluded with R to R and L to R collats.  Medical Tx was continued  . Chronic systolic heart failure ([place])    a. s/p LVAD 10/2013 for destination therapy  . CKD (chronic kidney disease)   . CVD (cerebrovascular disease)    a. s/p L CEA 2008  . Cytopenia   . Dysrhythmia    AFIB  . History of GI bleed    a. on ASA- started on plavix during 10/2015 admission for CVA  . HLD (hyperlipidemia)   . HTN (hypertension)   . Hypothyroidism   . Inappropriate therapy from implantable cardioverter-defibrillator    a. ATP for sinus tach SVT wavelet identified SVT but was no  passive (2014)  . Ischemic cardiomyopathy   . 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 Myocardial infarction ([place])    1992  .  PAF (paroxysmal atrial fibrillation) ([place])   . PVD (peripheral vascular disease) ([place])    a. 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 Shortness of breath dyspnea    W/ EXERTION   . Sleep apnea    NO CPAP NOW  HE SENT IT BACK YRS AGO  . Stroke [facility])    a. 10/2015 admission   . Ventricular tachycardia ([place])    a. s/p AICD;   h/o ICD shock;  Amiodarone Rx. S/P ablation in 2012    Medications:    Assessment: 77 year old male with history of LVAD implant being admitted for heparin bridge in anticipation of bladder tumor resection on 11/15.   INR is currently 2.26 this afternoon, will hold heparin for now and recheck INR at 2100. Patient started Ensure Alive which has some vitamin K.     Goal of Therapy:  INR 2.0-2.5 Heparin level 0.3-0.5 units/ml Monitor platelets by anticoagulation protocol: Yes   Plan:  Start heparin when INR <2.0 Recheck INR at 2100  Monitor for signs/symptoms of bleeding  Isaias Sakai, Pharm  D PGY1 Pharmacy Resident  Phone [phone] Please use AMION for clinical pharmacists numbers  09/18/2018      2:59 PM

## 2018-09-18 NOTE — Progress Notes (Addendum)
Advanced Heart Failure VAD Team Note  PCP-Cardiologist: No primary care provider on file.   Subjective:    Cr 2.1. Hgb 10.6.  INR 2.18  Feeling good this am. About 550 cc of dark UOP. Drank 2 gatorades yesterday. Denies dizziness or lightheadedness. No SOB.   LVAD Interrogation HM 2: Speed: 8800 Flow: 3.3 PI: 6.5 Power: 4.0. No PI events since noon yesterday.   Objective:    Vital Signs:   Temp:  [97.6 F (36.4 C)-98 F (36.7 C)] 97.6 F (36.4 C) (11/14 0400) Pulse Rate:  [76] 76 (11/13 2300) Resp:  [13-18] 16 (11/14 0400) BP: (69-103)/(54-89) 93/81 (11/14 0400) SpO2:  [99 %] 99 % (11/13 2300) Weight:  [76.4 kg-77.3 kg] 76.4 kg (11/14 0500) Last BM Date: 09/16/18 Mean arterial Pressure 80s  Intake/Output:   Intake/Output Summary (Last 24 hours) at 09/18/2018 0756 Last data filed at 09/18/2018 0420 Gross per 24 hour  Intake 480 ml  Output 550 ml  Net -70 ml     Physical Exam    General:  Elderly appearing. No resp difficulty HEENT: normal Neck: supple. JVP not elevated. Carotids 2+ bilat; no bruits. No lymphadenopathy or thyromegaly appreciated. Cor: Mechanical heart sounds with LVAD hum present. Lungs: clear Abdomen: soft, nontender, nondistended. No hepatosplenomegaly. No bruits or masses. Good bowel sounds. Driveline: C/D/I; securement device intact and driveline incorporated Extremities: no cyanosis, clubbing, rash, edema Neuro: alert & orientedx3, cranial nerves grossly intact. moves all 4 extremities w/o difficulty. Affect pleasant   Telemetry   A paced 70s, personally reviewed.   EKG    A paced with prolonged AV conduction, LBBB, personally reviewed.   Labs   Basic Metabolic Panel: Recent Labs  Lab 09/17/18 1343 09/18/18 0359  NA 134* 134*  K 4.3 4.4  CL 105 105  CO2 22 22  GLUCOSE 131* 99  BUN 63* 55*  CREATININE 2.16* 2.18*  CALCIUM 8.8* 8.9  MG 2.1  --     Liver Function Tests: Recent Labs  Lab 09/17/18 1343  AST 36  ALT 33   ALKPHOS 172*  BILITOT 0.6  PROT 7.1  ALBUMIN 3.3*   No results for input(s): LIPASE, AMYLASE in the last 168 hours. No results for input(s): AMMONIA in the last 168 hours.  CBC: Recent Labs  Lab 09/17/18 1343 09/18/18 0359  WBC 6.6 6.5  NEUTROABS 4.3  --   HGB 11.0* 10.6*  HCT 35.8* 33.6*  MCV 96.2 94.4  PLT 149* 130*    INR: Recent Labs  Lab 09/16/18 09/17/18 1343 09/18/18 0359  INR 2.5 2.33 2.18    Other results:  EKG:    Imaging   Dg Chest Port 1 View  Result Date: 09/17/2018 CLINICAL DATA:  Assess PICC line placement EXAM: PORTABLE CHEST 1 VIEW COMPARISON:  May 18, 2018 FINDINGS: No pneumothorax. A new right PICC line terminates in the central SVC. The LVAD is stable. The AICD device is stable. Mild pulmonary venous congestion, improved. No other interval changes or acute abnormalities. IMPRESSION: The new right PICC line is in good position. No other significant change. Electronically Signed   By: Dorise Bullion III M.D   On: 09/17/2018 22:15   [place] Ekg Site Rite  Result Date: 09/17/2018 If Site Rite image not attached, placement could not be confirmed due to current cardiac rhythm.     Medications:     Scheduled Medications: . amiodarone  200 mg Oral BID  . atorvastatin  80 mg Oral QHS  . docusate  sodium  200 mg Oral Daily  . feeding supplement (ENSURE ENLIVE)  237 mL Oral BID BM  . lactose free nutrition  237 mL Oral TID BM  . levothyroxine  150 mcg Oral QAC breakfast  . multivitamin with minerals  1 tablet Oral Daily  . pantoprazole  40 mg Oral BID AC  . sodium chloride flush  10-40 mL Intracatheter Q12H  . sotalol  80 mg Oral BID  . zinc sulfate  220 mg Oral Daily     Infusions:   PRN Medications:  acetaminophen, promethazine, sodium chloride flush, temazepam   Patient Profile   [name] is a 77 y.o. 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 He is s/p HM II LVAD  implant for destination therapy on October 19, 2013. VAD implantation was complicated by VT, syncope, AF/Aflutter and RHF. Required short term milrinone.   Admitted for heparin bridge for bladder tumor resection on 09/19/18.  Assessment/Plan:    1. 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  - Had 2 BCG infusions with Dr. Gloriann Loan but discontinued due to inability to tolerate - 7/12 s/p TURB with right ureteral stent placement.  - Plan for recurrent bladder tumor resection 09/19/18 with Dr Gloriann Loan. Coumadin on hold.  - INR 2.18. Will give ensure.  2. Chronic systolic HF: s/p HM-II LVAD 10/2014.  - Recent RHC in 1/19 with mildly low output. Started on milrinone without benefit.  - Volume status stable to dry. Encouraged po intake - VAD interrogated personally. Several low flow alarms. Plan ramp this afternoon.  (LVAD speed reduced to 8800 in 10/17 due to RV failure) - s/p previous driveline repair. No pump stops - Coumadin on hold for surgery as above. Will start heparin when INR <1.8. 3.RecurrentVTwith hypotension and low flow alarms on VAD: Underwent DC-CV 9/17, 08/17/17, and 04/01/18. S/p emergent DC-CV in ER 07/09/18  - Had several low flow alarms on 11/9, 11/4, and 10/31. ICD shows several runs of AT 10/24-10/28, all lasting about an hour. Nothing that correlates with low flow alarms.  -Quiescent VT onSotalol80mg BIDand amio - QTC slightly prolonged but stable. Previously reviewed with Dr. Caryl Comes and felt OK.  - Keep K > 4.0 Mg > 2.0 4. Atrial arrhythmias: Atrial fibrillation, atrial tachycardia. S/p DC-CV 06/04/14, DC-CV 03/02/15 and 01/19/16.  -Remains in NSR 5. 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 Required HD x1 - Creatininebaseline 1.4-1.5 - CR 2.16 -> 2.18 in setting of hypovolemia.  - Give 500 cc of NS.  6. Recurrent GIB - s/p clipping of 2 jejunal AVMs in  4/16. Continue coumadin and plavix.(with h/o CVA). - Admitted 4/18 with recurrent GIB. Transfused 2u. EGD/colon negative - Admitted 12./18. hgb 6.5, Transfused 5u. EGD with 2 AVMs treated with APC - Denies bleeding.  - Hgb 10.6.  - Has been getting octreotide infusions but too expensive for him. Holding for now.  7. HTN:  - MAPs 80s. No longer on sildenafil. Can add midodrine if needed.  8. LVAD:  - VAD interrogated personally. RAMP today as above.  - LDH 219. 9. Anticoagulation management:  - Goal INR 2-2.5 typically. Start heparin when INR <2.0 as above 10. Lumbar compression fracture: - s/p L3-L5 fusion 02/03/16. No change.  11. CVA: - 12/16 with left-sided weakness, now resolved. Continue atorvastatin. (also on warfarin INR goal  2-2.5). S/p R CEA 01/13/16. Plavix stopped 12/18 due to recurrent GIB 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 Forde Dandy in past.  - Amio increased due to recurrent atrial arrhythmias and VT.  - Recent FT4 elevated. Synthroid cut to 150 daily in 12/18.  - TSH 9.1 (High). T4 WNL.  13. Poor venous access - PICC line place.   I reviewed the LVAD parameters from today, and compared the results to the patient's prior recorded data.  No programming changes were made.  The LVAD is functioning within specified parameters.  The patient performs LVAD self-test daily.  LVAD interrogation was negative for any significant power changes, alarms or PI events/speed drops.  LVAD equipment check completed and is in good working order.  Back-up equipment present.   LVAD education done on emergency procedures and precautions and reviewed exit site care.   Length of Stay: 1  Annamaria Helling 09/18/2018, 7:56 AM  VAD Team --- VAD ISSUES ONLY--- Pager [phone] (7am - 7am)  Advanced Heart Failure Team  Pager [phone] (M-F; 7a - 4p)  Please contact [facility] for night-coverage after hours (4p -7a ) and weekends on [email or web address]  Patient seen and  examined with the above-signed Advanced Practice Provider and/or Housestaff. I personally reviewed laboratory data, imaging studies and relevant notes. I independently examined the patient and formulated the important aspects of the plan. I have edited the note to reflect any of my changes or salient points. I have personally discussed the plan with the patient and/or family.  Feels ok. But continues to pass blood. Creatinine up at 21.9 and not improving with hydration. Renal u/s obtained and shows recurrent R hydronephrosis without BOO. There is evidence of recurrent bladder tumor as well. INR remains at 2.2 despite Ensure. Will give 2.5 mg oral vit k and get type and screen for possible FFP in am. I dicussed with Dr. Gloriann Loan this evening. Will plan OR tomorrow for scope. Rhythm stable. VAD interrogated personally. Ramp echo performed and speed turned down to 8600.   Glori Bickers, MD  6:38 PM

## 2018-09-18 NOTE — Progress Notes (Addendum)
Pt taken to radiology for remal ultrasound, accompanied by transporter and nurse, returned to unit @ 1235hrs.

## 2018-09-18 NOTE — Progress Notes (Addendum)
Initial Nutrition Assessment  DOCUMENTATION CODES:   Non-severe (moderate) malnutrition in context of chronic illness  INTERVENTION:   -Continue MVI with minerals daily -Continue 200 mg zinc sulfate daly -D/c Ensure Enlive po BID, each supplement provides 350 kcal and 20 grams of protein -Continue Boost Plus TID, each supplement provides 360 kcals and 14 grams protein  NUTRITION DIAGNOSIS:   Moderate Malnutrition related to chronic illness(advanced heart failure, bladder cancer) as evidenced by mild fat depletion, moderate fat depletion, mild muscle depletion, moderate muscle depletion, percent weight loss.  GOAL:   Patient will meet greater than or equal to 90% of their needs  MONITOR:   PO intake, Supplement acceptance, Labs, Weight trends, Skin, I & O's  REASON FOR ASSESSMENT:   Malnutrition Screening Tool    ASSESSMENT:   Frank Hardrick McBrideis a 77 y.o.malewith h/o VT, PAD and severe systolic HF (EF 85-46%) due to mixed ischemic/nonischemic CM. He is s/p HM II LVAD implant for destination therapy on October 19, 2013. VAD implantation was complicated by VT, syncope, AF/Aflutter and RHF. Required short term milrinone.  He is being admitted today for heparin bridge for recurrent bladder tumor resection on 09/19/18 with Dr Gloriann Loan.  Pt admitted for heparin bridge for recurrent bladder tumor resection, scheduled for 09/19/18.   Spoke with pt at bedside, who is in good spirits today. He reports great appetite, however, concerned that he is getting the same menu items from the kitchen (pt had a tuna sandwich and baked chips for lunch two days in a row per his report). PTA, he reports having a great appetite and has improved greatly since last hospital admission. He consumes 3 meals per day, which are prepared by his significant other. Pt also consumes 2 Premier Protein shakes daily PTA. Noted meal completion 100%.   Noted 2 unopened Ensure supplements on tray table. Also noted pt  was sipping on a Boost Plus supplement. Pt states "they're trying to get my INR up so I can have surgery tomorrow". Pt prefers the Boost Plus supplements. Upon review of supplements, one Boost Plus supplement provides 32 mcg of vitamin K vs 20 mcg vitamin K in Ensure Enlive supplement. Will continue with Boost Plus due to increased vitamin K content and pt preference.  Pt reports he has lost weight and endorses muscle loss. Reviewed weight history, which reveals a 11% wt loss over the past 6 months, which is significant for time frame.   Discussed with pt importance of good meal and supplement intake to promote healing. Obtained pt food preferences and personally entered pt's meal order for dinner (chef salad). Encouraged pt to continue to consume Boost Plus supplement.   Labs reviewed: Na: 134.   NUTRITION - FOCUSED PHYSICAL EXAM:    Most Recent Value  Orbital Region  Moderate depletion  Upper Arm Region  Mild depletion  Thoracic and Lumbar Region  No depletion  Buccal Region  Mild depletion  Temple Region  Moderate depletion  Clavicle Bone Region  Mild depletion  Clavicle and Acromion Bone Region  No depletion  Scapular Bone Region  Mild depletion  Dorsal Hand  Mild depletion  Patellar Region  Mild depletion  Anterior Thigh Region  No depletion  Posterior Calf Region  No depletion  Edema (RD Assessment)  Mild  Hair  Reviewed  Eyes  Reviewed  Mouth  Reviewed  Skin  Reviewed  Nails  Reviewed       Diet Order:   Diet Order  Diet Heart Room service appropriate? Yes; Fluid consistency: Thin  Diet effective now              EDUCATION NEEDS:   Education needs have been addressed  Skin:  Skin Assessment: Reviewed RN Assessment  Last BM:  09/17/18  Height:   Ht Readings from Last 1 Encounters:  09/17/18 6' (1.829 m)    Weight:   Wt Readings from Last 1 Encounters:  09/18/18 76.4 kg    Ideal Body Weight:  80.9 kg  BMI:  Body mass index is 22.84  kg/m.  Estimated Nutritional Needs:   Kcal:  2200-2400  Protein:  105-120 grams  Fluid:  2.2-2.4 L    Naasia Weilbacher A. Jimmye Norman, RD, LDN, CDE Pager: [phone] After hours Pager: [phone]

## 2018-09-18 NOTE — Progress Notes (Signed)
Echocardiogram 2D Echocardiogram has been performed.  [name] 09/18/2018, 2:56 PM

## 2018-09-19 ENCOUNTER — Inpatient Hospital Stay (HOSPITAL_COMMUNITY): Payer: Medicare Other | Admitting: Certified Registered Nurse Anesthetist

## 2018-09-19 ENCOUNTER — Encounter (HOSPITAL_COMMUNITY): Payer: Self-pay | Admitting: Certified Registered Nurse Anesthetist

## 2018-09-19 ENCOUNTER — Encounter (HOSPITAL_COMMUNITY)
Admission: RE | Disposition: A | Discharge status: Home / Self Care | Payer: Self-pay | Source: Ambulatory Visit | Attending: Internal Medicine

## 2018-09-19 ENCOUNTER — Inpatient Hospital Stay (HOSPITAL_COMMUNITY): Payer: Medicare Other

## 2018-09-19 DIAGNOSIS — I132 Hypertensive heart and chronic kidney disease with heart failure and with stage 5 chronic kidney disease, or end stage renal disease: Secondary | ICD-10-CM | POA: Diagnosis not present

## 2018-09-19 DIAGNOSIS — Z95811 Presence of heart assist device: Secondary | ICD-10-CM | POA: Diagnosis not present

## 2018-09-19 DIAGNOSIS — C679 Malignant neoplasm of bladder, unspecified: Secondary | ICD-10-CM | POA: Diagnosis not present

## 2018-09-19 DIAGNOSIS — N170 Acute kidney failure with tubular necrosis: Secondary | ICD-10-CM | POA: Diagnosis not present

## 2018-09-19 DIAGNOSIS — I471 Supraventricular tachycardia: Secondary | ICD-10-CM | POA: Diagnosis not present

## 2018-09-19 DIAGNOSIS — C670 Malignant neoplasm of trigone of bladder: Secondary | ICD-10-CM | POA: Diagnosis not present

## 2018-09-19 DIAGNOSIS — C672 Malignant neoplasm of lateral wall of bladder: Secondary | ICD-10-CM | POA: Diagnosis not present

## 2018-09-19 DIAGNOSIS — I5022 Chronic systolic (congestive) heart failure: Secondary | ICD-10-CM | POA: Diagnosis not present

## 2018-09-19 HISTORY — PX: CYSTOGRAM: SHX6285

## 2018-09-19 HISTORY — PX: CYSTOSCOPY WITH RETROGRADE PYELOGRAM, URETEROSCOPY AND STENT PLACEMENT: SHX5789

## 2018-09-19 HISTORY — PX: TRANSURETHRAL RESECTION OF BLADDER TUMOR WITH MITOMYCIN-C: SHX6459

## 2018-09-19 LAB — T3, FREE: T3, Free: 1.4 pg/mL — ABNORMAL LOW (ref 2.0–4.4)

## 2018-09-19 LAB — BASIC METABOLIC PANEL
Anion gap: 7 (ref 5–15)
BUN: 54 mg/dL — AB (ref 8–23)
CO2: 23 mmol/L (ref 22–32)
Calcium: 8.9 mg/dL (ref 8.9–10.3)
Chloride: 106 mmol/L (ref 98–111)
Creatinine, Ser: 2.26 mg/dL — ABNORMAL HIGH (ref 0.61–1.24)
GFR calc Af Amer: 30 mL/min — ABNORMAL LOW (ref >60)
GFR, EST NON AFRICAN AMERICAN: 26 mL/min — AB (ref >60)
GLUCOSE: 100 mg/dL — AB (ref 70–99)
POTASSIUM: 4.7 mmol/L (ref 3.5–5.1)
Sodium: 136 mmol/L (ref 135–145)

## 2018-09-19 LAB — CBC
HEMATOCRIT: 32.3 % — AB (ref 39.0–52.0)
Hemoglobin: 10.3 g/dL — ABNORMAL LOW (ref 13.0–17.0)
MCH: 30.2 pg (ref 26.0–34.0)
MCHC: 31.9 g/dL (ref 30.0–36.0)
MCV: 94.7 fL (ref 80.0–100.0)
NRBC: 0 % (ref 0.0–0.2)
PLATELETS: 126 10*3/uL — AB (ref 150–400)
RBC: 3.41 MIL/uL — AB (ref 4.22–5.81)
RDW: 16.7 % — ABNORMAL HIGH (ref 11.5–15.5)
WBC: 7.3 10*3/uL (ref 4.0–10.5)

## 2018-09-19 LAB — PROTIME-INR
INR: 1.92
PROTHROMBIN TIME: 21.7 s — AB (ref 11.4–15.2)

## 2018-09-19 LAB — LACTATE DEHYDROGENASE: LDH: 208 U/L — ABNORMAL HIGH (ref 98–192)

## 2018-09-19 SURGERY — TRANSURETHRAL RESECTION OF BLADDER TUMOR WITH MITOMYCIN-C
Anesthesia: General | Site: Bladder

## 2018-09-19 MED ORDER — DEXAMETHASONE SODIUM PHOSPHATE 10 MG/ML IJ SOLN
INTRAMUSCULAR | Status: AC
  Filled 2018-09-19: qty 1

## 2018-09-19 MED ORDER — IOPAMIDOL (ISOVUE-300) INJECTION 61%
INTRAVENOUS | Status: AC
  Filled 2018-09-19: qty 50

## 2018-09-19 MED ORDER — DEXAMETHASONE SODIUM PHOSPHATE 10 MG/ML IJ SOLN
INTRAMUSCULAR | Status: DC | PRN
  Administered 2018-09-19: 5 mg via INTRAVENOUS

## 2018-09-19 MED ORDER — CEFAZOLIN SODIUM-DEXTROSE 2-4 GM/100ML-% IV SOLN
2.0000 g | INTRAVENOUS | Status: AC
  Administered 2018-09-19: 2 g via INTRAVENOUS
  Filled 2018-09-19 (×2): qty 100

## 2018-09-19 MED ORDER — SUGAMMADEX SODIUM 200 MG/2ML IV SOLN
INTRAVENOUS | Status: DC | PRN
  Administered 2018-09-19: 200 mg via INTRAVENOUS

## 2018-09-19 MED ORDER — PROTAMINE SULFATE 10 MG/ML IV SOLN
INTRAVENOUS | Status: AC
  Filled 2018-09-19: qty 10

## 2018-09-19 MED ORDER — OXYCODONE HCL 5 MG/5ML PO SOLN: 5.0000 mg | Freq: Once | ORAL | Status: DC | PRN

## 2018-09-19 MED ORDER — ONDANSETRON HCL 4 MG/2ML IJ SOLN
INTRAMUSCULAR | Status: DC | PRN
  Administered 2018-09-19: 4 mg via INTRAVENOUS

## 2018-09-19 MED ORDER — SODIUM CHLORIDE 0.9 % IR SOLN
Status: DC | PRN
  Administered 2018-09-19: 3000 mL
  Administered 2018-09-19: 1000 mL
  Administered 2018-09-19: 9000 mL
  Administered 2018-09-19 (×3): 3000 mL

## 2018-09-19 MED ORDER — FENTANYL CITRATE (PF) 250 MCG/5ML IJ SOLN
INTRAMUSCULAR | Status: AC
  Filled 2018-09-19: qty 5

## 2018-09-19 MED ORDER — PROMETHAZINE HCL 25 MG/ML IJ SOLN
INTRAMUSCULAR | Status: AC
  Filled 2018-09-19: qty 1

## 2018-09-19 MED ORDER — GEMCITABINE CHEMO FOR BLADDER INSTILLATION 2000 MG
2000.0000 mg | Freq: Once | INTRAVENOUS | Status: DC
  Filled 2018-09-19: qty 52.6

## 2018-09-19 MED ORDER — ACETAMINOPHEN 500 MG PO TABS: 1000.0000 mg | ORAL_TABLET | Freq: Once | ORAL | Status: DC | PRN

## 2018-09-19 MED ORDER — IODIXANOL 320 MG/ML IV SOLN
INTRAVENOUS | Status: DC | PRN
  Administered 2018-09-19: 16 mL via INTRAVENOUS

## 2018-09-19 MED ORDER — HEPARIN (PORCINE) 25000 UT/250ML-% IV SOLN
1300.0000 [IU]/h | INTRAVENOUS | Status: DC
  Administered 2018-09-19: 1300 [IU]/h via INTRAVENOUS
  Filled 2018-09-19: qty 250

## 2018-09-19 MED ORDER — OXYCODONE HCL 5 MG PO TABS: 5.0000 mg | ORAL_TABLET | Freq: Once | ORAL | Status: DC | PRN

## 2018-09-19 MED ORDER — LIDOCAINE 2% (20 MG/ML) 5 ML SYRINGE
INTRAMUSCULAR | Status: DC | PRN
  Administered 2018-09-19: 60 mg via INTRAVENOUS

## 2018-09-19 MED ORDER — ROCURONIUM BROMIDE 10 MG/ML (PF) SYRINGE
PREFILLED_SYRINGE | INTRAVENOUS | Status: DC | PRN
  Administered 2018-09-19: 20 mg via INTRAVENOUS
  Administered 2018-09-19: 10 mg via INTRAVENOUS
  Administered 2018-09-19: 20 mg via INTRAVENOUS

## 2018-09-19 MED ORDER — ONDANSETRON HCL 4 MG/2ML IJ SOLN
INTRAMUSCULAR | Status: AC
  Filled 2018-09-19: qty 2

## 2018-09-19 MED ORDER — HEPARIN SODIUM (PORCINE) 1000 UNIT/ML IJ SOLN
INTRAMUSCULAR | Status: AC
  Filled 2018-09-19: qty 2

## 2018-09-19 MED ORDER — PROPOFOL 10 MG/ML IV BOLUS
INTRAVENOUS | Status: DC | PRN
  Administered 2018-09-19: 20 mg via INTRAVENOUS
  Administered 2018-09-19: 40 mg via INTRAVENOUS

## 2018-09-19 MED ORDER — PROMETHAZINE HCL 25 MG/ML IJ SOLN
6.2500 mg | Freq: Once | INTRAMUSCULAR | Status: AC
  Administered 2018-09-19: 6.25 mg via INTRAVENOUS

## 2018-09-19 MED ORDER — ACETAMINOPHEN 10 MG/ML IV SOLN: 1000.0000 mg | Freq: Once | INTRAVENOUS | Status: DC | PRN

## 2018-09-19 MED ORDER — IOTHALAMATE MEGLUMINE 17.2 % UR SOLN: 250.0000 mL | Freq: Once | URETHRAL | Status: DC | PRN

## 2018-09-19 MED ORDER — FENTANYL CITRATE (PF) 250 MCG/5ML IJ SOLN
INTRAMUSCULAR | Status: DC | PRN
  Administered 2018-09-19: 50 ug via INTRAVENOUS
  Administered 2018-09-19: 25 ug via INTRAVENOUS
  Administered 2018-09-19: 50 ug via INTRAVENOUS
  Administered 2018-09-19: 25 ug via INTRAVENOUS

## 2018-09-19 MED ORDER — SODIUM CHLORIDE 0.9 % IV SOLN
INTRAVENOUS | Status: DC
  Administered 2018-09-19 – 2018-09-21 (×3): via INTRAVENOUS

## 2018-09-19 MED ORDER — SODIUM CHLORIDE 0.9% IV SOLUTION: Freq: Once | INTRAVENOUS | Status: DC

## 2018-09-19 MED ORDER — FENTANYL CITRATE (PF) 100 MCG/2ML IJ SOLN: 25.0000 ug | INTRAMUSCULAR | Status: DC | PRN

## 2018-09-19 MED ORDER — IOTHALAMATE MEGLUMINE 17.2 % UR SOLN
URETHRAL | Status: DC | PRN
  Administered 2018-09-19: 250 mL via URETHRAL

## 2018-09-19 MED ORDER — ACETAMINOPHEN 160 MG/5ML PO SOLN: 1000.0000 mg | Freq: Once | ORAL | Status: DC | PRN

## 2018-09-19 MED ORDER — PHENYLEPHRINE HCL 10 MG/ML IJ SOLN
INTRAMUSCULAR | Status: DC | PRN
  Administered 2018-09-19: 40 ug via INTRAVENOUS

## 2018-09-19 MED ORDER — SODIUM CHLORIDE 0.9 % IV SOLN
INTRAVENOUS | Status: DC | PRN
  Administered 2018-09-19: 25 ug/min via INTRAVENOUS

## 2018-09-19 SURGICAL SUPPLY — 17 items
BAG URINE DRAINAGE (UROLOGICAL SUPPLIES) IMPLANT
BAG URO CATCHER STRL LF (MISCELLANEOUS) ×3 IMPLANT
CATH FOLEY 3WAY 30CC 22FR (CATHETERS) ×1 IMPLANT
CATH INTERMIT  6FR 70CM (CATHETERS) ×1 IMPLANT
COVER WAND RF STERILE (DRAPES) IMPLANT
GLOVE BIO SURGEON STRL SZ7.5 (GLOVE) ×3 IMPLANT
GOWN STRL REUS W/TWL LRG LVL3 (GOWN DISPOSABLE) ×3 IMPLANT
GUIDEWIRE STR DUAL SENSOR (WIRE) ×1 IMPLANT
LOOP CUT BIPOLAR 24F LRG (ELECTROSURGICAL) ×1 IMPLANT
MANIFOLD NEPTUNE II (INSTRUMENTS) ×3 IMPLANT
PACK CYSTO (CUSTOM PROCEDURE TRAY) ×3 IMPLANT
SET CYSTO W/LG BORE CLAMP LF (SET/KITS/TRAYS/PACK) IMPLANT
SET IRRIG Y TYPE TUR BLADDER L (SET/KITS/TRAYS/PACK) ×3 IMPLANT
SYRINGE IRR TOOMEY STRL 70CC (SYRINGE) IMPLANT
TUBE CONNECTING 12X1/4 (SUCTIONS) ×3 IMPLANT
TUBING ASPIRATION INDIGO (MISCELLANEOUS) IMPLANT
TUBING SET SONASTAR MISONIX (TUBING) IMPLANT

## 2018-09-19 NOTE — Progress Notes (Signed)
Advanced Heart Failure VAD Team Note  PCP-Cardiologist: No primary care provider on file.   Subjective:    Denies CP or SOB. Still with dark urine. Renal u/s obtained and shows recurrent R hydronephrosis without BOO. There is evidence of recurrent bladder tumor as well.   Ramp echo 11/14 speed turned down to 8600  Given vit K 2.5 yesterday. INR 1.9 this am. On heparin.   LVAD Interrogation HM 2: Speed: 8600 Flow: 3.5 PI: 6.5 Power: 4.0. VAD interrogated personally. Parameters stable.   Objective:    Vital Signs:   Temp:  [97.4 F (36.3 C)-98.2 F (36.8 C)] 98 F (36.7 C) (11/15 0735) Pulse Rate:  [75-82] 76 (11/15 0735) Resp:  [14-26] 18 (11/15 0735) BP: (82-96)/(63-82) 88/76 (11/15 0735) SpO2:  [98 %-99 %] 99 % (11/15 0255) Weight:  [76.2 kg] 76.2 kg (11/15 0554) Last BM Date: 09/18/18 Mean arterial Pressure 80s  Intake/Output:   Intake/Output Summary (Last 24 hours) at 09/19/2018 0744 Last data filed at 09/19/2018 0736 Gross per 24 hour  Intake 730 ml  Output 1400 ml  Net -670 ml     Physical Exam    General:  NAD.  HEENT: normal  Neck: supple. JVP 7.  Carotids 2+ bilat; no bruits. No lymphadenopathy or thryomegaly appreciated. Cor: LVAD hum.  Lungs: Clear. Abdomen: obese soft, nontender, non-distended. No hepatosplenomegaly. No bruits or masses. Good bowel sounds. Driveline site clean. Anchor in place.  Extremities: no cyanosis, clubbing, rash. Warm no edema  Neuro: alert & oriented x 3. No focal deficits. Moves all 4 without problem   Telemetry   A paced 70s, Personally reviewed   EKG    A paced with prolonged AV conduction, LBBB, personally reviewed.   Labs   Basic Metabolic Panel: Recent Labs  Lab 09/17/18 1343 09/18/18 0359 09/18/18 1338 09/19/18 0418  NA 134* 134* 134* 136  K 4.3 4.4 4.3 4.7  CL 105 105 106 106  CO2 22 22 22 23   GLUCOSE 131* 99 121* 100*  BUN 63* 55* 51* 54*  CREATININE 2.16* 2.18* 2.19* 2.26*  CALCIUM 8.8* 8.9  8.8* 8.9  MG 2.1  --   --   --     Liver Function Tests: Recent Labs  Lab 09/17/18 1343  AST 36  ALT 33  ALKPHOS 172*  BILITOT 0.6  PROT 7.1  ALBUMIN 3.3*   No results for input(s): LIPASE, AMYLASE in the last 168 hours. No results for input(s): AMMONIA in the last 168 hours.  CBC: Recent Labs  Lab 09/17/18 1343 09/18/18 0359 09/19/18 0418  WBC 6.6 6.5 7.3  NEUTROABS 4.3  --   --   HGB 11.0* 10.6* 10.3*  HCT 35.8* 33.6* 32.3*  MCV 96.2 94.4 94.7  PLT 149* 130* 126*    INR: Recent Labs  Lab 09/17/18 1343 09/18/18 0359 09/18/18 1338 09/18/18 2106 09/19/18 0418  INR 2.33 2.18 2.26 2.14 1.92    Other results:  EKG:    Imaging   US Renal  Result Date: 09/18/2018 CLINICAL DATA:  77 year old male with acute kidney injury. History of bladder cancer with TURBT. EXAM: RENAL / URINARY TRACT ULTRASOUND COMPLETE COMPARISON:  05/19/2018 ultrasound and 12/14/2017 CT FINDINGS: Right Kidney: Renal measurements: 10.3 x 5.3 x 5.7 cm = volume: 169 mL. Moderate to severe RIGHT hydronephrosis now identified. RIGHT renal cysts are again noted, the largest measuring 7.4 cm. Echogenicity within normal limits. Left Kidney: Renal measurements: 11.8 x 6.5 x 5.9 cm = volume: 235 mL.  Echogenicity within normal limits. LEFT renal cysts are again noted. No hydronephrosis identified. Bladder: New bladder wall nodules/masses are noted, some which contain internal vascular flow and compatible with malignancy. The largest nodule measures 2.4 cm anteriorly. Prostate enlargement is present. IMPRESSION: 1. Bladder wall masses/nodules with vascular flow now noted compatible with malignancy. The largest bladder wall mass measures 2.4 cm anteriorly. 2. New moderate to severe RIGHT hydronephrosis. Electronically Signed   By: Margarette Canada M.D.   On: 09/18/2018 13:07   Dg Chest Port 1 View  Result Date: 09/17/2018 CLINICAL DATA:  Assess PICC line placement EXAM: PORTABLE CHEST 1 VIEW COMPARISON:  May 18, 2018 FINDINGS: No pneumothorax. A new right PICC line terminates in the central SVC. The LVAD is stable. The AICD device is stable. Mild pulmonary venous congestion, improved. No other interval changes or acute abnormalities. IMPRESSION: The new right PICC line is in good position. No other significant change. Electronically Signed   By: Dorise Bullion III M.D   On: 09/17/2018 22:15   [place] Ekg Site Rite  Result Date: 09/17/2018 If Site Rite image not attached, placement could not be confirmed due to current cardiac rhythm.    Medications:     Scheduled Medications: . amiodarone  200 mg Oral BID  . atorvastatin  80 mg Oral QHS  . docusate sodium  200 mg Oral Daily  . lactose free nutrition  237 mL Oral TID BM  . levothyroxine  150 mcg Oral QAC breakfast  . multivitamin with minerals  1 tablet Oral Daily  . pantoprazole  40 mg Oral BID AC  . sodium chloride flush  10-40 mL Intracatheter Q12H  . sotalol  80 mg Oral BID  . zinc sulfate  220 mg Oral Daily    Infusions: . heparin 1,300 Units/hr (09/19/18 0600)    PRN Medications: acetaminophen, promethazine, sodium chloride flush, temazepam   Patient Profile   [name] is a 77 y.o. male with h/o VT, PAD and severe systolic HF (EF 26-71%) due to mixed ischemic/nonischemic CM. He is s/p HM II LVAD implant for destination therapy on October 19, 2013. VAD implantation was complicated by VT, syncope, AF/Aflutter and RHF. Required short term milrinone.   Admitted for heparin bridge for bladder tumor resection on 09/19/18.  Assessment/Plan:    1. 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  - Had 2 BCG infusions with Dr. Gloriann Loan but discontinued due to inability to tolerate - 7/12 s/p TURB with right ureteral stent placement.  - Plan for recurrent bladder tumor resection 09/19/18 with Dr Gloriann Loan. Coumadin on hold.  - Renal u/s obtained and shows  recurrent R hydronephrosis without BOO. There is evidence of recurrent bladder tumor as well. Discussed with Dr. Gloriann Loan personally. - Received 2.5mg  vit k on 11/14. INR 1.92. Will give 2u FFP. For OR today  2. Chronic systolic HF s/p HM-II LVAD 10/2014  - RHC in 1/19 with mildly low output. Started on milrinone without benefit.  - Volume status stable - Ramp echo 11/14 speed turned down to 8600. Moderate RV HK. VAD interrogated personally. Parameters stable. (LVAD speed reduced to 8800 in 10/17 due to RV failure) - s/p previous driveline repair. No pump stops - Coumadin on hold for surgery as above. Now on heparin 3.RecurrentVTwith hypotension and low flow alarms on VAD: Underwent DC-CV 9/17, 08/17/17, and 04/01/18. S/p emergent DC-CV in ER 07/09/18  - Had several  low flow alarms on 11/9, 11/4, and 10/31. ICD shows several runs of AT 10/24-10/28, all lasting about an hour. Nothing that correlates with low flow alarms.  -Quiescent VT onSotalol80mg BIDand amio - QTC slightly prolonged but stable. Previously reviewed with Dr. Caryl Comes and felt OK.  - Keep K > 4.0 Mg > 2.0 4. Atrial arrhythmias: Atrial fibrillation, atrial tachycardia. S/p DC-CV 06/04/14, DC-CV 03/02/15 and 01/19/16.  -Remains in NSR. Device interrogation this admit shows multiple brief runs atrial tach 5. 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 Required HD x1 - Creatininebaseline 1.4-1.5 - CR 2.16 -> 2.18 -> 2.26 in setting of R hydronephrosis - Urology following 6. H/o Recurrent GIB - Most recent bleed 12/18. hgb 6.5, Transfused 5u. EGD with 2 AVMs treated with APC - Denies bleeding.  - Hgb 10.3.  - Has been getting octreotide infusions but too expensive for him. Holding for now.     I reviewed the LVAD parameters from today, and compared the results to the patient's prior recorded data.  No programming changes were made.  The LVAD is functioning within specified parameters.  The  patient performs LVAD self-test daily.  LVAD interrogation was negative for any significant power changes, alarms or PI events/speed drops.  LVAD equipment check completed and is in good working order.  Back-up equipment present.   LVAD education done on emergency procedures and precautions and reviewed exit site care.   Length of Stay: 2  Glori Bickers, MD 09/19/2018, 7:44 AM  VAD Team --- VAD ISSUES ONLY--- Pager [phone] (7am - 7am)  Advanced Heart Failure Team  Pager [phone] (M-F; 7a - 4p)  Please contact [facility] for night-coverage after hours (4p -7a ) and weekends on [email or web address]

## 2018-09-19 NOTE — Progress Notes (Signed)
VAD Coordinator Procedure Note:   VAD Coordinator met patient in short stay/OR. Pt undergoing bladder tumor resection per Dr. Gloriann Loan. Hemodynamics and VAD parameters monitored by myself and anesthesia throughout the procedure. Blood pressures were obtained with automatic cuff on left arm and correlated with Doppler.    Time: Doppler Auto  BP Flow PI Power Speed  Pre-procedure:  1200 90 106/85 (91) 3.5 6.9 4.1 8600   1215  105/88 (91) 3.4 6.8 4.1 8600   1230  108/91 (95) 3.5 6.6 4.1 8600   1245  115/95 (98) 3.6 6.4 4.1 8600  Sedation Induction: 1300  114/98 (104) 3.6 6.3 4.2 8600   1315  92/81  3.2 4.5 4.0 8600   1330  82/72 (77) 3.5 4.6 4.0 8600   1345  94/82 (88) 3.2 5.7 4.0 8600   1400  93/80 (87) 3.1 5.5 4.0 8600   1415  99/86 (93) 3.0 5.6 3.9 8600   1430  93/82 (87) 3.1 5.5 3.9 8600   1445  96/79 (86) 3.1 5.7 4.0 8600  Case End 1450  (103) 3.2 5.7 4.0    1455  106/88 (96) 3.3 6.4 4.0   Recovery Area: 1500  105/88 (94) 3.7 6.5 4.1 8600   1515  107/90 (97) 3.6 6.3 4.1 8600   1530  103/81 (89) 3.5 6.2 4.1 8600   1545  96/85 (90) 3.6 6.5 4.1 8600                      Patient tolerated the procedure well. VAD Coordinator accompanied and remained with patient in recovery area. Patient has 3 way foley catheter in place. Nauseated in PACU; given IV Phenergan.   Patient Disposition: Transported back to 2C01.

## 2018-09-19 NOTE — Progress Notes (Signed)
ANTICOAGULATION CONSULT NOTE - Follow Up Consult  Pharmacy Consult for heparin Indication: LVAD  Labs: Recent Labs    09/17/18 1343 09/18/18 0359 09/18/18 1338 09/18/18 2106 09/19/18 0418  HGB 11.0* 10.6*  --   --  10.3*  HCT 35.8* 33.6*  --   --  32.3*  PLT 149* 130*  --   --  126*  LABPROT 25.2* 24.0* 24.7* 23.6* 21.7*  INR 2.33 2.18 2.26 2.14 1.92  CREATININE 2.16* 2.18* 2.19*  --  2.26*    Assessment: 77yo male now w/ INR <2, to begin heparin.  Goal of Therapy:  Heparin level 0.3-0.5 units/ml   Plan:  Will start heparin gtt at 1300 units/hr and check level in 8 hours.    Wynona Neat, PharmD, BCPS  09/19/2018,5:34 AM

## 2018-09-19 NOTE — Transfer of Care (Signed)
Immediate Anesthesia Transfer of Care Note  Patient: [name]  Procedure(s) Performed: TRANSURETHRAL RESECTION OF BLADDER TUMOR (N/A Bladder) left  RETROGRADE PYELOGRAM (Left Bladder) CYSTOGRAM (N/A Bladder)  Patient Location: PACU  Anesthesia Type:General  Level of Consciousness: awake, alert  and patient cooperative  Airway & Oxygen Therapy: Patient Spontanous Breathing and Patient connected to nasal cannula oxygen  Post-op Assessment: Report given to RN and Post -op Vital signs reviewed and stable  Post vital signs: Reviewed and stable  Last Vitals:  Vitals Value Taken Time  BP 105/88 09/19/2018  3:02 PM  Temp    Pulse 85 09/19/2018  3:06 PM  Resp 16 09/19/2018  3:06 PM  SpO2 91 % 09/19/2018  3:06 PM  Vitals shown include unvalidated device data.  Last Pain:  Vitals:   09/19/18 1200  TempSrc: Oral  PainSc:          Complications: No apparent anesthesia complications

## 2018-09-19 NOTE — H&P (Signed)
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  He has had multiple recurrences.  He was most recently found to have a recurrence about a month ago.  He presents for repeat transurethral resection of bladder tumor, bilateral retrograde pyelogram, possible bilateral ureteroscopy.  He has no complaints today.  He has been having some gross hematuria.  Renal ultrasound was performed which showed some bladder tumors in the bladder up to 2.5 cm.  This also revealed moderate to severe hydroureteronephrosis on the right which is new from prior ultrasound.  Of note, I performed a retrograde pyelogram in the operating room there revealed hydroureteronephrosis.  Intermittent hydronephrosis is consistent with possible reflux given that I had to resect over the right ureteral orifice to clear the initial tumor.  Past Medical History:  Diagnosis Date  . Arthritis   . Automatic implantable cardioverter-defibrillator in situ    a. s/p Medtronic Evera device serial [id number]    . Bradycardia   . CAD (coronary artery disease)    a. s/p MI 1982;  s/p DES to CFX 2011;  s/p NSTEMI 4/12 in setting of VTach;  cath 7/12:  LM ok, LAD mid 30-40%, Dx's with 40%, mCFX stent patent, prox to mid RCA occluded with R to R and L to R collats.  Medical Tx was continued  . Chronic systolic heart failure ([place])    a. s/p LVAD 10/2013 for destination therapy  . CKD (chronic kidney disease)   . CVD (cerebrovascular disease)    a. s/p L CEA 2008  . Cytopenia   . Dysrhythmia    AFIB  . History of GI bleed    a. on ASA- started on plavix during 10/2015 admission for CVA  . HLD (hyperlipidemia)   . HTN (hypertension)   . Hypothyroidism   . Inappropriate therapy from implantable cardioverter-defibrillator    a. ATP for sinus tach SVT wavelet identified SVT  but was no  passive (2014)  . Ischemic cardiomyopathy   . 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 L Ear   . Myocardial infarction ([place])    1992  . PAF (paroxysmal atrial fibrillation) ([place])   . PVD (peripheral vascular disease) ([place])    a. 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 Shortness of breath dyspnea    W/ EXERTION   . Sleep apnea    NO CPAP NOW  HE SENT IT BACK YRS AGO  . Stroke [facility])    a. 10/2015 admission   . Ventricular tachycardia ([place])    a. s/p AICD;   h/o ICD shock;  Amiodarone Rx. S/P ablation in 2012   Past Surgical History:  Procedure Laterality Date  . BACK SURGERY    . CARDIAC CATHETERIZATION     "several" (05/25/2013)  . CARDIAC DEFIBRILLATOR PLACEMENT  2003; 2005; 05/25/2013   2014: Medtronic Evera device serial number [id number]  . CARDIAC ELECTROPHYSIOLOGY STUDY AND ABLATION  2012  . CARDIOVERSION N/A 10/15/2013   Procedure: CARDIOVERSION;  Surgeon: Jolaine Artist, MD;  Location: [place];  Service: Cardiovascular;  Laterality: N/A;  . CARDIOVERSION N/A 11/04/2013   Procedure: CARDIOVERSION;  Surgeon: Larey Dresser, MD;  Location: [place];  Service: Cardiovascular;  Laterality:  N/A;  . CARDIOVERSION N/A 06/04/2014   Procedure: CARDIOVERSION;  Surgeon: Jolaine Artist, MD;  Location: [facility] ENDOSCOPY;  Service: Cardiovascular;  Laterality: N/A;  . CARDIOVERSION N/A 03/02/2015   Procedure: CARDIOVERSION;  Surgeon: Jolaine Artist, MD;  Location: [facility] ENDOSCOPY;  Service: Cardiovascular;  Laterality: N/A;  . CARDIOVERSION N/A 01/19/2016   Procedure: CARDIOVERSION;  Surgeon: Larey Dresser, MD;  Location: [place];  Service: Cardiovascular;  Laterality: N/A;  . CARDIOVERSION N/A 06/10/2018   Procedure: CARDIOVERSION;  Surgeon: Jolaine Artist, MD;  Location: [facility] ENDOSCOPY;  Service: Cardiovascular;  Laterality: N/A;  . CARDIOVERSION N/A 07/02/2018   Procedure:  CARDIOVERSION;  Surgeon: Jolaine Artist, MD;  Location: [place];  Service: Cardiovascular;  Laterality: N/A;  . CAROTID ENDARTERECTOMY Left ~ 2007  . CATARACT EXTRACTION W/ INTRAOCULAR LENS  IMPLANT, BILATERAL Bilateral 2000  . CHOLECYSTECTOMY  12/15/?2010  . COLONOSCOPY WITH PROPOFOL N/A 08-08-2017   Procedure: COLONOSCOPY WITH PROPOFOL;  Surgeon: Ronald Lobo, MD;  Location: [place];  Service: Endoscopy;  Laterality: N/A;  . CORONARY ANGIOPLASTY  1992  . CORONARY ANGIOPLASTY WITH STENT PLACEMENT     "last one was 11/2012  (05/25/2013)  . CYSTOSCOPY W/ RETROGRADES Bilateral 05/16/2018   Procedure: CYSTOSCOPY WITH RETROGRADE PYELOGRAM;  Surgeon: Lucas Mallow, MD;  Location: [place];  Service: Urology;  Laterality: Bilateral;  . CYSTOSCOPY WITH RETROGRADE PYELOGRAM, URETEROSCOPY AND STENT PLACEMENT Left 08/09/2014   Procedure: CYSTOSCOPY WITH RETROGRADE PYELOGRAM, URETEROSCOPY AND STENT PLACEMENT;  Surgeon: Bernestine Amass, MD;  Location: [facility];  Service: Urology;  Laterality: Left;  . CYSTOSCOPY WITH RETROGRADE PYELOGRAM, URETEROSCOPY AND STENT PLACEMENT Left 09/01/2014   Procedure: CYSTOSCOPY WITH URETEROSCOPY AND STENT REMOVAL;  Surgeon: Bernestine Amass, MD;  Location[facility];  Service: Urology;  Laterality: Left;  . CYSTOSCOPY WITH RETROGRADE PYELOGRAM, URETEROSCOPY AND STENT PLACEMENT Left 09/20/2014   Procedure: CYSTOSCOPY WITH RETROGRADE PYELOGRAM, URETEROSCOPY AND STENT PLACEMENT, stone removal;  Surgeon: Bernestine Amass, MD;  [facility];  Service: Urology;  Laterality: Left;  . CYSTOSCOPY WITH RETROGRADE PYELOGRAM, URETEROSCOPY AND STENT PLACEMENT Bilateral 12/04/2017   Procedure: CYSTOSCOPY WITH BILATERAL RETROGRADE PYELOGRAM, LEFT URETEROSCOPY AND STENT PLACEMENT;  Surgeon: Lucas Mallow, MD;  Location: [place];  Service: Urology;  Laterality: Bilateral;  . DOPPLER ECHOCARDIOGRAPHY  2011  . ELBOW ARTHROSCOPY Right 1990's  . ELECTROPHYSIOLOGIC STUDY N/A 07/19/2016    Procedure: Cardioversion;  Surgeon: Evans Lance, MD;  Location: [place] CV LAB;  Service: Cardiovascular;  Laterality: N/A;  . ENDARTERECTOMY Right 01/13/2016   Procedure: RIGHT CAROTID ARTERY ENDARTERECTOMY;  Surgeon: Mal Misty, MD;  Location: [place];  Service: Vascular;  Laterality: Right;  . ENTEROSCOPY Left 02/23/2017   Procedure: ENTEROSCOPY;  Surgeon: Ronald Lobo, MD;  Location: [facility] ENDOSCOPY;  Service: Endoscopy;  Laterality: Left;  . ENTEROSCOPY N/A 10/17/2017   Procedure: ENTEROSCOPY;  Surgeon: Yetta Flock, MD;  Location: [facility] ENDOSCOPY;  Service: Gastroenterology;  Laterality: N/A;  LVAD pt.    . ESOPHAGOGASTRODUODENOSCOPY N/A 02/15/2015   Procedure: ESOPHAGOGASTRODUODENOSCOPY (EGD);  Surgeon: Carol Ada, MD;  Location: [facility] ENDOSCOPY;  Service: Endoscopy;  Laterality: N/A;  LVAD  . EYE SURGERY     VITRECTOMY  LEFT 05/2012  . FEMORAL-POPLITEAL BYPASS GRAFT Right 2011  . FEMORAL-POPLITEAL BYPASS GRAFT Left 05/2002   Archie Endo 05/06/2002 (05/25/2013)  . HEEL SPUR SURGERY Right 1990's  . HOLMIUM LASER APPLICATION Left 63/14/9702   Procedure: HOLMIUM LASER APPLICATION;  Surgeon: Bernestine Amass, MD;  [facility];  Service: Urology;  Laterality: Left;  . HOLMIUM LASER APPLICATION Left 9/81/1914   Procedure: HOLMIUM LASER APPLICATION;  Surgeon: Lucas Mallow, MD;  Location: [place];  Service: Urology;  Laterality: Left;  . IMPLANTABLE CARDIOVERTER DEFIBRILLATOR GENERATOR CHANGE N/A 05/25/2013   Procedure: IMPLANTABLE CARDIOVERTER DEFIBRILLATOR GENERATOR CHANGE;  Surgeon: Deboraha Sprang, MD;  Location: [facility] CATH LAB;  Service: Cardiovascular;  Laterality: N/A;  . INSERTION OF IMPLANTABLE LEFT VENTRICULAR ASSIST DEVICE N/A 10/19/2013   Procedure: INSERTION OF IMPLANTABLE LEFT VENTRICULAR ASSIST DEVICE;  Surgeon: Gaye Pollack, MD;  Location: [place];  Service: Open Heart Surgery;  Laterality: N/A;  CIRC ARREST  NITRIC OXIDE  MEDTRONIC ICD  . INTRAOPERATIVE TRANSESOPHAGEAL  ECHOCARDIOGRAM N/A 10/19/2013   Procedure: INTRAOPERATIVE TRANSESOPHAGEAL ECHOCARDIOGRAM;  Surgeon: Gaye Pollack, MD;  Location: [facility] OR;  Service: Open Heart Surgery;  Laterality: N/A;  . IR FLUORO GUIDE CV LINE RIGHT  12/10/2017  . IR US GUIDE VASC ACCESS RIGHT  12/10/2017  . KNEE ARTHROSCOPY Bilateral 1990's  . LEAD REVISION N/A 06/05/2013   Procedure: LEAD REVISION;  Surgeon: Thompson Grayer, MD;  Location: [facility] CATH LAB;  Service: Cardiovascular;  Laterality: N/A;  . LEFT HEART CATHETERIZATION WITH CORONARY ANGIOGRAM N/A 11/24/2012   Procedure: LEFT HEART CATHETERIZATION WITH CORONARY ANGIOGRAM;  Surgeon: Peter M Martinique, MD;  Location: [facility] CATH LAB;  Service: Cardiovascular;  Laterality: N/A;  . LIPOMA EXCISION Left 2013   "near ear" (05/25/2013)  . [place]; 2000's  . PATCH ANGIOPLASTY Right 01/13/2016   Procedure: WITH  PATCH ANGIOPLASTY;  Surgeon: Mal Misty, MD;  Location: [place];  Service: Vascular;  Laterality: Right;  . RENAL ARTERY BYPASS Bilateral 2003  . REVASCULARIZATION / IN-SITU GRAFT LEG    . RIGHT HEART CATH N/A 11/29/2017   Procedure: RIGHT HEART CATH;  Surgeon: Jolaine Artist, MD;  Location: [place] CV LAB;  Service: Cardiovascular;  Laterality: N/A;  . RIGHT HEART CATHETERIZATION N/A 09/17/2013   Procedure: RIGHT HEART CATH;  Surgeon: Jolaine Artist, MD;  Location: [facility] CATH LAB;  Service: Cardiovascular;  Laterality: N/A;  . RIGHT HEART CATHETERIZATION N/A 10/14/2013   Procedure: RIGHT HEART CATH;  Surgeon: Jolaine Artist, MD;  Location: [facility] CATH LAB;  Service: Cardiovascular;  Laterality: N/A;  . RIGHT HEART CATHETERIZATION N/A 11/16/2013   Procedure: RIGHT HEART CATH;  Surgeon: Jolaine Artist, MD;  Location: [facility] CATH LAB;  Service: Cardiovascular;  Laterality: N/A;  . RIGHT HEART CATHETERIZATION N/A 07/13/2014   Procedure: RIGHT HEART CATH;  Surgeon: Jolaine Artist, MD;  Location: [facility] CATH LAB;  Service: Cardiovascular;  Laterality: N/A;  . TEE  WITHOUT CARDIOVERSION N/A 11/04/2013   Procedure: TRANSESOPHAGEAL ECHOCARDIOGRAM (TEE);  Surgeon: Larey Dresser, MD;  Location: [place];  Service: Cardiovascular;  Laterality: N/A;  . TEE WITHOUT CARDIOVERSION N/A 03/02/2015   Procedure: TRANSESOPHAGEAL ECHOCARDIOGRAM (TEE);  Surgeon: Jolaine Artist, MD;  Location: [facility] ENDOSCOPY;  Service: Cardiovascular;  Laterality: N/A;  . TEE WITHOUT CARDIOVERSION N/A 01/19/2016   Procedure: TRANSESOPHAGEAL ECHOCARDIOGRAM (TEE);  Surgeon: Larey Dresser, MD;  Location: [place];  Service: Cardiovascular;  Laterality: N/A;  . TONSILLECTOMY  1946  . TRANSURETHRAL RESECTION OF BLADDER TUMOR N/A 12/04/2017   Procedure: TRANSURETHRAL RESECTION OF BLADDER TUMOR (TURBT);  Surgeon: Lucas Mallow, MD;  Location: [place];  Service: Urology;  Laterality: N/A;  . TRANSURETHRAL RESECTION OF BLADDER TUMOR N/A 05/16/2018   Procedure: TRANSURETHRAL RESECTION OF  BLADDER TUMOR (TURBT);  Surgeon: Lucas Mallow, MD;  Location: [place];  Service: Urology;  Laterality: N/A;  . TUMOR EXCISION Left 1960's   "fatty tumor" (05/25/2013)  . V-TACH ABLATION N/A 09/12/2011   Procedure: V-TACH ABLATION;  Surgeon: Evans Lance, MD;  Location: [facility] CATH LAB;  Service: Cardiovascular;  Laterality: N/A;  . V-TACH ABLATION N/A 06/08/2013   Procedure: V-TACH ABLATION;  Surgeon: Evans Lance, MD;  Location: [facility] CATH LAB;  Service: Cardiovascular;  Laterality: N/A;    Home Medications:  Medications Prior to Admission  Medication Sig Dispense Refill Last Dose  . amiodarone (PACERONE) 200 MG tablet Take 1 tablet (200 mg total) by mouth 2 (two) times daily. 120 tablet 5   . atorvastatin (LIPITOR) 80 MG tablet Take 1 tablet (80 mg total) by mouth at bedtime. 90 tablet 1   . docusate sodium (COLACE) 100 MG capsule Take 200 mg by mouth daily.    Taking  . furosemide (LASIX) 20 MG tablet Take 20 mg by mouth daily as needed for fluid.   Taking  . levothyroxine (SYNTHROID, LEVOTHROID)  150 MCG tablet Take 1 tablet (150 mcg total) by mouth daily before breakfast. 90 tablet 3 Taking  . Multiple Vitamin (MULTIVITAMIN WITH MINERALS) TABS tablet Take 1 tablet by mouth daily.     . Multiple Vitamins-Minerals (ICAPS PO) Take 1 tablet by mouth 2 (two) times daily.   Taking  . pantoprazole (PROTONIX) 40 MG tablet TAKE 1 TABLET TWICE DAILY (Patient taking differently: Take 40 mg by mouth 2 (two) times daily before a meal. ) 180 tablet 6 Taking  . promethazine (PHENERGAN) 12.5 MG tablet Take 12.5 mg by mouth every 6 (six) hours as needed for nausea or vomiting.   Not Taking  . protein supplement shake (PREMIER PROTEIN) LIQD Take 11 oz by mouth 2 (two) times daily.     . sotalol (BETAPACE) 80 MG tablet Take 1 tablet (80 mg total) by mouth 2 (two) times daily. 60 tablet 6 09/19/2018 at Unknown time  . temazepam (RESTORIL) 30 MG capsule Take 1 capsule (30 mg total) by mouth at bedtime as needed for sleep. 30 capsule 3 Taking  . warfarin (COUMADIN) 2 MG tablet Take 2 mg by mouth every evening.    Taking  . zinc gluconate 50 MG tablet Take 50 mg by mouth 2 (two) times daily.    Taking  . lactose free nutrition (BOOST PLUS) LIQD Take 237 mLs by mouth 3 (three) times daily between meals. (Patient not taking: Reported on 09/15/2018) 90 Can 0 Not Taking at Unknown time   Allergies:  Allergies  Allergen Reactions  . Demerol [Meperidine] Other (See Comments)    Paralysis/ Could only move eyes    Family History  Problem Relation Age of Onset  . Heart attack Mother   . Coronary artery disease Mother   . Cancer Mother   . Heart disease Mother   . Hyperlipidemia Mother   . Hypertension Mother   . Coronary artery disease Father   . Stroke Father   . Cancer Father   . Heart disease Father        before age 96  . Hyperlipidemia Father   . Hypertension Father   . Heart attack Father   . Coronary artery disease Unknown    Social History:  reports that he quit smoking about 16 years ago. His  smoking use included cigarettes. He has a 18.50 pack-year smoking history. He has never used  smokeless tobacco. He reports that he drinks alcohol. He reports that he does not use drugs.  ROS: A complete review of systems was performed.  All systems are negative except for pertinent findings as noted. ROS   Physical Exam:  Vital signs in last 24 hours: Temp:  [97.4 F (36.3 C)-98.2 F (36.8 C)] 98.1 F (36.7 C) (11/15 1200) Pulse Rate:  [73-82] 76 (11/15 1200) Resp:  [14-26] 17 (11/15 1200) BP: (82-106)/(63-89) 106/72 (11/15 1200) SpO2:  [98 %-100 %] 100 % (11/15 0930) Weight:  [76.2 kg] 76.2 kg (11/15 0554) General:  Alert and oriented, No acute distress HEENT: Normocephalic, atraumatic Neck: No JVD or lymphadenopathy Cardiovascular: Regular rate and rhythm Lungs: Regular rate and effort Abdomen: Soft, nontender, nondistended, no abdominal masses Back: No CVA tenderness Extremities: No edema Neurologic: Grossly intact  Laboratory Data:  Results for orders placed or performed during the hospital encounter of 09/17/18 (from the past 24 hour(s))  Protime-INR     Status: Abnormal   Collection Time: 09/18/18  1:38 PM  Result Value Ref Range   Prothrombin Time 24.7 (H) 11.4 - 15.2 seconds   INR 2.99   Basic metabolic panel     Status: Abnormal   Collection Time: 09/18/18  1:38 PM  Result Value Ref Range   Sodium 134 (L) 135 - 145 mmol/L   Potassium 4.3 3.5 - 5.1 mmol/L   Chloride 106 98 - 111 mmol/L   CO2 22 22 - 32 mmol/L   Glucose, Bld 121 (H) 70 - 99 mg/dL   BUN 51 (H) 8 - 23 mg/dL   Creatinine, Ser 2.19 (H) 0.61 - 1.24 mg/dL   Calcium 8.8 (L) 8.9 - 10.3 mg/dL   GFR calc non Af [name] 27 (L) >60 mL/min   GFR calc Af [name] 31 (L) >60 mL/min   Anion gap 6 5 - 15  Protime-INR     Status: Abnormal   Collection Time: 09/18/18  9:06 PM  Result Value Ref Range   Prothrombin Time 23.6 (H) 11.4 - 15.2 seconds   INR 2.14   Lactate dehydrogenase     Status: Abnormal   Collection  Time: 09/19/18  4:18 AM  Result Value Ref Range   LDH 208 (H) 98 - 192 U/L  Protime-INR     Status: Abnormal   Collection Time: 09/19/18  4:18 AM  Result Value Ref Range   Prothrombin Time 21.7 (H) 11.4 - 15.2 seconds   INR 1.92   CBC     Status: Abnormal   Collection Time: 09/19/18  4:18 AM  Result Value Ref Range   WBC 7.3 4.0 - 10.5 K/uL   RBC 3.41 (L) 4.22 - 5.81 MIL/uL   Hemoglobin 10.3 (L) 13.0 - 17.0 g/dL   HCT 32.3 (L) 39.0 - 52.0 %   MCV 94.7 80.0 - 100.0 fL   MCH 30.2 26.0 - 34.0 pg   MCHC 31.9 30.0 - 36.0 g/dL   RDW 16.7 (H) 11.5 - 15.5 %   Platelets 126 (L) 150 - 400 K/uL   nRBC 0.0 0.0 - 0.2 %  Basic metabolic panel     Status: Abnormal   Collection Time: 09/19/18  4:18 AM  Result Value Ref Range   Sodium 136 135 - 145 mmol/L   Potassium 4.7 3.5 - 5.1 mmol/L   Chloride 106 98 - 111 mmol/L   CO2 23 22 - 32 mmol/L   Glucose, Bld 100 (H) 70 - 99 mg/dL   BUN 54 (  H) 8 - 23 mg/dL   Creatinine, Ser 2.26 (H) 0.61 - 1.24 mg/dL   Calcium 8.9 8.9 - 10.3 mg/dL   GFR calc non Af [name] 26 (L) >60 mL/min   GFR calc Af [name] 30 (L) >60 mL/min   Anion gap 7 5 - 15  Prepare fresh frozen plasma     Status: None (Preliminary result)   Collection Time: 09/19/18  8:00 AM  Result Value Ref Range   Unit Number [id number]    Blood Component Type THAWED PLASMA    Unit division 00    Status of Unit ISSUED    Transfusion Status OK TO TRANSFUSE    Unit Number [id number]    Blood Component Type THW PLS APHR    Unit division A0    Status of Unit ISSUED    Transfusion Status      OK TO TRANSFUSE Performed at [facility], [place] [address] [place], [place] [address]    *Note: Due to a large number of results and/or encounters for the requested time period, some results have not been displayed. A complete set of results can be found in Results Review.   Recent Results (from the past 240 hour(s))  MRSA PCR Screening     Status: None   Collection Time: 09/17/18  1:36 PM   Result Value Ref Range Status   MRSA by PCR NEGATIVE NEGATIVE Final    Comment:        The GeneXpert MRSA Assay (FDA approved for NASAL specimens only), is one component of a comprehensive MRSA colonization surveillance program. It is not intended to diagnose MRSA infection nor to guide or monitor treatment for MRSA infections. Performed at [facility], [place] [address] [place], [place] [address]    Creatinine: Recent Labs    09/17/18 1343 09/18/18 0359 09/18/18 1338 09/19/18 0418  CREATININE 2.16* 2.18* 2.19* 2.26*    Impression/Assessment:  Bladder tumor Right hydronephrosis  Plan:  Proceed with transurethral resection of bladder tumor, bilateral retrograde pyelogram, possible diagnostic ureteroscopy, cystogram to rule out reflux.  He understands potential risks including but not limited to bleeding, infection, injury to surrounding structures including bladder perforation, potential for need for blood transfusion, potential renal failure given his history, potential death.  He wishes to proceed.  Marton Redwood, III 09/19/2018, 12:49 PM

## 2018-09-19 NOTE — Progress Notes (Signed)
RN instructed to hold heparin gtt post surgery until further notice from MD.

## 2018-09-19 NOTE — Anesthesia Preprocedure Evaluation (Signed)
Anesthesia Evaluation  Patient identified by MRN, date of birth, ID band Patient awake    Reviewed: Allergy & Precautions, NPO status , Patient's Chart, lab work & pertinent test results  History of Anesthesia Complications Negative for: history of anesthetic complications  Airway Mallampati: II  TM Distance: >3 FB Neck ROM: Full    Dental  (+) Edentulous Upper, Edentulous Lower   Pulmonary sleep apnea , former smoker,    breath sounds clear to auscultation       Cardiovascular hypertension, Pt. on medications + CAD, + Past MI, + Peripheral Vascular Disease and +CHF  + dysrhythmias Atrial Fibrillation + Cardiac Defibrillator  Rhythm:Irregular  '19 RHC - Biventricular failure despite VAD support  '19 TTE - LV cavity size was mildly dilated. LVAD present.  Aortic root is mildly dilated at 41 mm. LA was severely dilated. RV cavity size was mildly dilated. Systolic function was moderately to severely reduced. RA was mildly dilated.  Difficult to auscultate due to whirring sound from LVAD   Neuro/Psych TIACVA negative psych ROS   GI/Hepatic negative GI ROS, Neg liver ROS,   Endo/Other  Hypothyroidism   Renal/GU CRFRenal diseaseBladder Ca  negative genitourinary   Musculoskeletal  (+) Arthritis ,   Abdominal   Peds  Hematology  (+) anemia ,   Anesthesia Other Findings   Reproductive/Obstetrics                             Anesthesia Physical Anesthesia Plan  ASA: IV  Anesthesia Plan: General   Post-op Pain Management:    Induction: Intravenous  PONV Risk Score and Plan: 2 and Ondansetron and Dexamethasone  Airway Management Planned: LMA  Additional Equipment: None  Intra-op Plan:   Post-operative Plan: Extubation in OR  Informed Consent: I have reviewed the patients History and Physical, chart, labs and discussed the procedure including the risks, benefits and alternatives for  the proposed anesthesia with the patient or authorized representative who has indicated his/her understanding and acceptance.   Dental advisory given  Plan Discussed with: CRNA and Surgeon  Anesthesia Plan Comments:         Anesthesia Quick Evaluation

## 2018-09-19 NOTE — Anesthesia Procedure Notes (Signed)
Procedure Name: LMA Insertion Date/Time: 09/19/2018 1:13 PM Performed by: White, Amedeo Plenty, CRNA Pre-anesthesia Checklist: Patient identified, Emergency Drugs available, Suction available and Patient being monitored Patient Re-evaluated:Patient Re-evaluated prior to induction Oxygen Delivery Method: Circle System Utilized Preoxygenation: Pre-oxygenation with 100% oxygen Induction Type: IV induction Ventilation: Mask ventilation without difficulty LMA: LMA inserted LMA Size: 4.0 Number of attempts: 1 Airway Equipment and Method: Bite block Placement Confirmation: positive ETCO2 Tube secured with: Tape Dental Injury: Teeth and Oropharynx as per pre-operative assessment

## 2018-09-19 NOTE — Progress Notes (Signed)
LVAD Coordinator Rounding Note:  Admitted on 09/17/18 for heparin bridge for bladder tumor resection scheduled on Friday 11/15.   HM II Implanted 10/19/13 by Dr. Cyndia Bent under Destination Therapy criteria.  Pt lying in bed receiving unit of FFP. Heparin gtt was turned off around 0800 this morning. Pt will receive 2 units of FFP.   Vital signs: Temp: 98 HR: 75 Doppler Pressure:80 Automatic BP:88/76 (82) O2 Sat: 100% on RA  Weight: 77.3>76.4>76.2kg  LVAD interrogation reveals:  Speed: 8600 Flow:3.4 Power: 3.9w PI: 6.5  Alarms: no alarms overnight  11/9: 36 Low flows  11/8: 3 LF  11/7 1 LF  11/4 14 LF  10/31- 4 LF   Events:5 PI this morning  Fixed speed: 8600 Low speed limit: 8000  Drive Line: Sorbaview dressing dry and intact; anchor intact and accurately applied. Weekly dressing changes.May be changed by [facility], or bedside nurse.  Labs:  LDH trend: 261>219>208  INR trend:  2.5>2.18>1.92  Creat trend: 1.55>2.18>2.26  Anticoagulation Plan: - INR Goal: 2.0 - 2.5 - ASA Dose: no ASA (removed 02/2017 due to GI bleed)  Device: -Dual Medtronic ICD -Therapies: on at 200 bpm - Last device check 10/02/17  Significant Events on VAD Support:  09/01/14>>ureteral stone with cystoscopy and flexible ureteroscopy with lithotripsy and basket extraction 02/2015 >>GIB with 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 Started on Plavix 3/17>>Lumbar fusion L3-L5 9/17>>VT with DC-CV; amio increased 10/17>>ramp with RV failure; speed decreased to 8800 02/2017> GIB (removed ASA, scopes negative) 03/2017>percutaneous lead repair after pump stops  10/18>>Slow VT. Cardioverted in ED and amiodarone uptitrated 12/18>>melena with scope. Hgb 6.5. EGD with 2 AVMs with APC 12/18>>recurrent hematuria. 6 mm left ureteral  stone 1/19>>7 mm left ureteral stone removal with laser lithotripsy; left ureteral stent. Resection of 6 cm bladder tumor    Plan/Recommendations:  1. Please call VAD pager if any issues with VAD equipment or questions, concerns. 2. VAD coordinator will accompany patient to OR today for bladder tumor resection.      Tanda Rockers RN [place] Coordinator  Office: [phone]  24/7 Pager: [phone]

## 2018-09-19 NOTE — Op Note (Signed)
Operative Note  Preoperative diagnosis:  1. Bladder tumor--large 2.  History of T1 bladder cancer 3.  Right hydroureteronephrosis  Postoperative diagnosis: 1. same  Procedure(s): 1. Transurethral resection of bladder tumor--large 2.  Left retrograde pyelogram 3. Cystogram  Surgeon: Link Snuffer, MD  Assistants:none  Anesthesia: General  Complications: none immediate  EBL: minimal  Specimens: 1. Bladder tumor  Drains/Catheters: 1. 22 French three-way Foley catheter  Intraoperative findings: 1.  Normal urethra 2.  The right ureteral orifice was not visible.  There was about 2 cm invasive-appearing tumor overlying the right ureteral orifice.  There is also about 2 cm tumor just lateral to the left ureteral orifice but was not overlying it.  I did not have to resect over the left ureteral orifice.  There was a large greater than 5 cm bladder tumor on the anterior bladder wall.  There were multiple other small tumors.  All tumors appeared to be completely resected superficially.left retrograde pyelogram revealed no filling defects.  There was no hydronephrosis.  Cystogram revealed no evidence of reflux on the right.  Indication: 77 year old male with a LVAD and a history of T1 bladder cancer presents for repeat TURBT with bilateral retrograde pyelogram. Cystogram was also recommended to see if there was any reflux of the right to explain the hydronephrosis.  Description of procedure:  The patient was identified and consent was obtained.  The patient was taken to the operating room and placed in the supine position.  The patient was placed under Generalanesthesia.  Perioperative antibiotics were administered.  The patient was placed in dorsal lithotomy.  Patient was prepped and draped in a standard sterile fashion and a timeout was performed.  A Foley catheter was placed.About 400 cc of Cystografin was then instilled into the bladder and there was no evidence of any reflux.  The  catheter was removed.  A 21 French rigid cystoscope was advanced into the urethra and into the bladder.  Complete cystoscopy was performed.  I was able to locate the left ureteral orifice and cannulated this with an open-ended ureteral catheter and a retrograde pyelogram was performed with the findings noted above.  I turned my attention to the right ureteral orifice but was unable to locate it.  I tried to pass a wire in the area of the ureteral orifice but was unable to identify.  There was a large tumor overlying that area.  It appeared aggressive and high-grade and possibly muscle invasive.  I exchanged the scope for a 29 French resectoscope which was advanced then with a visual obturator.  This was exchanged for the working component.  On bipolar settings, all superficial portions of the bladder tumor were resected and all the areas listed above.  This appeared to be resected down to muscle fibers.  All bladder tumor beds were fulgurated as well as the surrounding area.  I did have to resect over the area of the right ureteral orifice and also had to perform fulguration for bleeding.  I never did identify any obvious efflux of urine from that area.all bladder chips were collected and passed off for permanent specimen.  The bladder mucosa was reinspected and there was no obvious significant active bleeding.  I therefore withdrew the scope and placed a 22 French three-way Foley catheter.  Patient tolerated the procedure well and was stable postoperative.  Plan:he'll be transferred back to the cardiac unit.  He'll be monitored for several days.  Unfortunately, I was unable to access the right ureter.  If  he develops significant pain, recommend pain control and can consider a right nephrostomy tube but in his current state of health would recommend being the least aggressive as possible.  The left side appeared normal on retrograde pyelogram.  Continue Foley catheter for now.  We will remove it once he has  clinically improved.  Please call with any questions or concerns.  Eventually, I will present him at our multidisciplinary cancer conference to see if there are any other options for him such as palliative radiation.  I do think he needs a restaging CT scan possibly before he leaves the hospital but does not have to be performed in the hospital.  This will evaluate for development of any lymphadenopathy.

## 2018-09-19 NOTE — Plan of Care (Signed)
Pt. Is progressing. Continue with plan of care.

## 2018-09-19 NOTE — Care Management Important Message (Signed)
Important Message  Patient Details  Name: [name] MRN: [id number] Date of Birth: 1941-02-13   Medicare Important Message Given:  Yes    Gunnar Hereford Montine Circle 09/19/2018, 3:11 PM

## 2018-09-20 ENCOUNTER — Inpatient Hospital Stay (HOSPITAL_COMMUNITY): Payer: Medicare Other

## 2018-09-20 DIAGNOSIS — Z95811 Presence of heart assist device: Secondary | ICD-10-CM | POA: Diagnosis not present

## 2018-09-20 DIAGNOSIS — C670 Malignant neoplasm of trigone of bladder: Secondary | ICD-10-CM | POA: Diagnosis not present

## 2018-09-20 DIAGNOSIS — C672 Malignant neoplasm of lateral wall of bladder: Secondary | ICD-10-CM | POA: Diagnosis not present

## 2018-09-20 DIAGNOSIS — C679 Malignant neoplasm of bladder, unspecified: Secondary | ICD-10-CM | POA: Diagnosis not present

## 2018-09-20 DIAGNOSIS — I5022 Chronic systolic (congestive) heart failure: Secondary | ICD-10-CM | POA: Diagnosis not present

## 2018-09-20 DIAGNOSIS — I471 Supraventricular tachycardia: Secondary | ICD-10-CM | POA: Diagnosis not present

## 2018-09-20 LAB — CBC
HEMATOCRIT: 31.6 % — AB (ref 39.0–52.0)
Hemoglobin: 9.8 g/dL — ABNORMAL LOW (ref 13.0–17.0)
MCH: 30 pg (ref 26.0–34.0)
MCHC: 31 g/dL (ref 30.0–36.0)
MCV: 96.6 fL (ref 80.0–100.0)
NRBC: 0 % (ref 0.0–0.2)
Platelets: 124 10*3/uL — ABNORMAL LOW (ref 150–400)
RBC: 3.27 MIL/uL — ABNORMAL LOW (ref 4.22–5.81)
RDW: 16.6 % — ABNORMAL HIGH (ref 11.5–15.5)
WBC: 10.4 10*3/uL (ref 4.0–10.5)

## 2018-09-20 LAB — BPAM FFP
BLOOD PRODUCT EXPIRATION DATE: 201911202359
Blood Product Expiration Date: 201911202359
ISSUE DATE / TIME: 201911150930
ISSUE DATE / TIME: 201911151101
Unit Type and Rh: 9500
Unit Type and Rh: 9500

## 2018-09-20 LAB — BASIC METABOLIC PANEL
ANION GAP: 7 (ref 5–15)
BUN: 50 mg/dL — AB (ref 8–23)
CALCIUM: 8.6 mg/dL — AB (ref 8.9–10.3)
CO2: 23 mmol/L (ref 22–32)
Chloride: 107 mmol/L (ref 98–111)
Creatinine, Ser: 2.2 mg/dL — ABNORMAL HIGH (ref 0.61–1.24)
GFR calc Af Amer: 31 mL/min — ABNORMAL LOW (ref >60)
GFR calc non Af Amer: 27 mL/min — ABNORMAL LOW (ref >60)
GLUCOSE: 122 mg/dL — AB (ref 70–99)
Potassium: 4.8 mmol/L (ref 3.5–5.1)
Sodium: 137 mmol/L (ref 135–145)

## 2018-09-20 LAB — HEPARIN LEVEL (UNFRACTIONATED): Heparin Unfractionated: <0.1 IU/mL — ABNORMAL LOW (ref 0.30–0.70)

## 2018-09-20 LAB — PREPARE FRESH FROZEN PLASMA: UNIT DIVISION: 0

## 2018-09-20 LAB — PROTIME-INR
INR: 1.28
Prothrombin Time: 15.9 seconds — ABNORMAL HIGH (ref 11.4–15.2)

## 2018-09-20 LAB — LACTATE DEHYDROGENASE: LDH: 200 U/L — AB (ref 98–192)

## 2018-09-20 MED ORDER — BOOST PLUS PO LIQD
237.0000 mL | Freq: Two times a day (BID) | ORAL | Status: DC
  Administered 2018-09-20 – 2018-09-29 (×8): 237 mL via ORAL
  Filled 2018-09-20 (×21): qty 237

## 2018-09-20 NOTE — Progress Notes (Signed)
Patient ID: [name], male   DOB: 1941/05/14, 77 y.o.   MRN: [id number]    Assessment: 1.  Large bladder tumor status post TURBT 11/15: He had a large tumor involving the right ureteral orifice.  Preoperatively he had been found to have right hydronephrosis.  The presence of a bladder tumor in this location and hydronephrosis is consistent with invasive disease until proven otherwise.  The second tumor was located lateral to the left UO.  Foley catheter was left indwelling overnight and his urine remains bloody but there are no clots.  It is draining.  He is not uncomfortable.  In the past he has developed worsening creatinine after bladder tumor resection however his creatinine currently remains stable although elevated.  2.  Right hydronephrosis: This is suggestive of invasive bladder cancer.  The ureteral orifice could not be identified at the time of resection.  Renal ultrasound has been performed with results pending.  His creatinine remained stable.  There is no indication at this time for placement of a nephrostomy tube.   Plan:  1.  Follow serial creatinine. 2.  Continue Foley catheter drainage with as needed irrigation. 3.  Would continue to hold heparin for now.  Low-dose heparin may be reasonable tomorrow.   Subjective: Patient reports no suprapubic discomfort.  He reports no episodes of catheter occlusion with clots over night.  Objective: Vital signs in last 24 hours: Temp:  [97.5 F (36.4 [name])-98.1 F (36.7 [name])] 97.9 F (36.6 [name]) (11/16 1107) Pulse Rate:  [75-88] 88 (11/16 1107) Resp:  [9-27] 20 (11/16 1107) BP: (76-107)/(62-90) 83/64 (11/16 1107) SpO2:  [91 %-100 %] 91 % (11/16 1107) Weight:  [74.3 kg] 74.3 kg (11/16 0553)A  Intake/Output from previous day: 11/15 0701 - 11/16 0700 In: 1535.6 [P.O.:480; I.V.:688.4; Blood:367.3] Out: 1000 [Urine:1000] Intake/Output this shift: Total I/O In: 172 [P.O.:120; I.V.:52] Out: 400 [Urine:400]  Past Medical History:    Diagnosis Date  . Arthritis   . Automatic implantable cardioverter-defibrillator in situ    a. s/p Medtronic Evera device serial [id number]    . Bradycardia   . CAD (coronary artery disease)    a. s/p MI 1982;  s/p DES to CFX 2011;  s/p NSTEMI 4/12 in setting of VTach;  cath 7/12:  LM ok, LAD mid 30-40%, Dx's with 40%, mCFX stent patent, prox to mid RCA occluded with R to R and L to R collats.  Medical Tx was continued  . Chronic systolic heart failure ([place])    a. s/p LVAD 10/2013 for destination therapy  . CKD (chronic kidney disease)   . CVD (cerebrovascular disease)    a. s/p L CEA 2008  . Cytopenia   . Dysrhythmia    AFIB  . History of GI bleed    a. on ASA- started on plavix during 10/2015 admission for CVA  . HLD (hyperlipidemia)   . HTN (hypertension)   . Hypothyroidism   . Inappropriate therapy from implantable cardioverter-defibrillator    a. ATP for sinus tach SVT wavelet identified SVT but was no  passive (2014)  . Ischemic cardiomyopathy   . 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 L Ear   . Myocardial infarction ([place])    1992  . PAF (paroxysmal atrial fibrillation) ([place])   . PVD (peripheral vascular disease) ([place])    a. h/o claudication;  s/p R fem to pop BPG 3/11;  s/p L CFA BPG 7/03;  s/p repair of inf AAA 6/03;  s/p aorta to  bilat renal BPG 6/03  . Shortness of breath dyspnea    W/ EXERTION   . Sleep apnea    NO CPAP NOW  HE SENT IT BACK YRS AGO  . Stroke [facility])    a. 10/2015 admission   . Ventricular tachycardia ([place])    a. s/p AICD;   h/o ICD shock;  Amiodarone Rx. S/P ablation in 2012    Physical Exam:  General: Awake, alert and in no apparent distress Lungs: Normal respiratory effort, chest expands symmetrically.  Abdomen: Soft, non-tender & non-distended.  No SP tenderness or mass. GU: Foley catheter indwelling draining bloody urine with no clots.  Lab Results: Recent Labs    09/18/18 0359 09/19/18 0418 09/20/18 0500  WBC 6.5 7.3 10.4  HGB 10.6* 10.3*  9.8*  HCT 33.6* 32.3* 31.6*   BMET Recent Labs    09/19/18 0418 09/20/18 0500  NA 136 137  K 4.7 4.8  CL 106 107  CO2 23 23  GLUCOSE 100* 122*  BUN 54* 50*  CREATININE 2.26* 2.20*  CALCIUM 8.9 8.6*   No results for input(s): LABURIN in the last 72 hours. Results for orders placed or performed during the hospital encounter of 09/17/18  MRSA PCR Screening     Status: None   Collection Time: 09/17/18  1:36 PM  Result Value Ref Range Status   MRSA by PCR NEGATIVE NEGATIVE Final    Comment:        The GeneXpert MRSA Assay (FDA approved for NASAL specimens only), is one component of a comprehensive MRSA colonization surveillance program. It is not intended to diagnose MRSA infection nor to guide or monitor treatment for MRSA infections. Performed at [facility], [place] [address], [place], [place] [address]    *Note: Due to a large number of results and/or encounters for the requested time period, some results have not been displayed. A complete set of results can be found in Results Review.    Studies/Results: Dg Retrograde Pyelogram  Result Date: 09/19/2018 CLINICAL DATA:  Left retrograde pyelogram EXAM: RETROGRADE PYELOGRAM COMPARISON:  05/16/2018 FINDINGS: Spot fluoroscopic imaging performed during the operative procedure. Retrograde left ureteral injection performed. Left ureter and collecting system appear patent. No obstruction, significant strictures, or filling defect. No hydronephrosis. Bifid left renal pelvis present. IMPRESSION: Patent left ureter without obstruction or hydronephrosis. Electronically Signed   By: Jerilynn Mages.  Shick M.D.   On: 09/19/2018 15:08      [name] 09/20/2018, 11:17 AM

## 2018-09-20 NOTE — Progress Notes (Signed)
Patient with some bloody drainage around foley site. Foley anchor changed as to prevent pulling and further injury. Foley irrigated with sterile saline (65mL), returned serous drainage with small clot. Bleeding around catheter decreased. Will monitor closely and irrigate catheter as needed.

## 2018-09-20 NOTE — Progress Notes (Signed)
Advanced Heart Failure VAD Team Note  PCP-Cardiologist: No primary care provider on file.   Subjective:    Denies CP or SOB. Still with dark urine. Renal u/s obtained and shows recurrent R hydronephrosis without BOO. There is evidence of recurrent bladder tumor as well.   Ramp echo 11/14 speed turned down to 8600  Went to OR 11/15 for bladder tumor resection. Multiple large tumors. Unable to uncover R ureteral orifice. Feels ok now. Foley in place. Peeing blood. No pain or fevers. No SOB  LVAD Interrogation HM 2: Speed: 8600 Flow: 3.4 PI: 5.1 Power: 4.1. VAD interrogated personally. Parameters stable.   Objective:    Vital Signs:   Temp:  [97.5 F (36.4 C)-98.1 F (36.7 C)] 97.7 F (36.5 C) (11/16 0706) Pulse Rate:  [73-86] 75 (11/16 0706) Resp:  [9-27] 15 (11/16 0706) BP: (76-107)/(62-90) 99/86 (11/16 0706) SpO2:  [91 %-100 %] 100 % (11/16 0706) Weight:  [74.3 kg] 74.3 kg (11/16 0553) Last BM Date: 09/18/18 Mean arterial Pressure 70-80s  Intake/Output:   Intake/Output Summary (Last 24 hours) at 09/20/2018 0903 Last data filed at 09/20/2018 6378 Gross per 24 hour  Intake 1541.75 ml  Output 1000 ml  Net 541.75 ml     Physical Exam   General:  NAD.  HEENT: normal  Neck: supple. JVP not elevated.  Carotids 2+ bilat; no bruits. No lymphadenopathy or thryomegaly appreciated. Cor: LVAD hum.  Lungs: Clear. Abdomen: soft, nontender, non-distended. No hepatosplenomegaly. No bruits or masses. Good bowel sounds. Driveline site clean. Anchor in place.  Extremities: no cyanosis, clubbing, rash. Warm no edema  + Foley with hematuria Neuro: alert & oriented x 3. No focal deficits. Moves all 4 without problem    Telemetry   A paced 70-80s, Personally reviewed   EKG    A paced with prolonged AV conduction, LBBB, personally reviewed.   Labs   Basic Metabolic Panel: Recent Labs  Lab 09/17/18 1343 09/18/18 0359 09/18/18 1338 09/19/18 0418 09/20/18 0500  NA 134*  134* 134* 136 137  K 4.3 4.4 4.3 4.7 4.8  CL 105 105 106 106 107  CO2 22 22 22 23 23   GLUCOSE 131* 99 121* 100* 122*  BUN 63* 55* 51* 54* 50*  CREATININE 2.16* 2.18* 2.19* 2.26* 2.20*  CALCIUM 8.8* 8.9 8.8* 8.9 8.6*  MG 2.1  --   --   --   --     Liver Function Tests: Recent Labs  Lab 09/17/18 1343  AST 36  ALT 33  ALKPHOS 172*  BILITOT 0.6  PROT 7.1  ALBUMIN 3.3*   No results for input(s): LIPASE, AMYLASE in the last 168 hours. No results for input(s): AMMONIA in the last 168 hours.  CBC: Recent Labs  Lab 09/17/18 1343 09/18/18 0359 09/19/18 0418 09/20/18 0500  WBC 6.6 6.5 7.3 10.4  NEUTROABS 4.3  --   --   --   HGB 11.0* 10.6* 10.3* 9.8*  HCT 35.8* 33.6* 32.3* 31.6*  MCV 96.2 94.4 94.7 96.6  PLT 149* 130* 126* 124*    INR: Recent Labs  Lab 09/18/18 0359 09/18/18 1338 09/18/18 2106 09/19/18 0418 09/20/18 0500  INR 2.18 2.26 2.14 1.92 1.28    Other results:  EKG:    Imaging   Dg Retrograde Pyelogram  Result Date: 09/19/2018 CLINICAL DATA:  Left retrograde pyelogram EXAM: RETROGRADE PYELOGRAM COMPARISON:  05/16/2018 FINDINGS: Spot fluoroscopic imaging performed during the operative procedure. Retrograde left ureteral injection performed. Left ureter and collecting system appear patent.  No obstruction, significant strictures, or filling defect. No hydronephrosis. Bifid left renal pelvis present. IMPRESSION: Patent left ureter without obstruction or hydronephrosis. Electronically Signed   By: Jerilynn Mages.  Shick M.D.   On: 09/19/2018 15:08   US Renal  Result Date: 09/18/2018 CLINICAL DATA:  77 year old male with acute kidney injury. History of bladder cancer with TURBT. EXAM: RENAL / URINARY TRACT ULTRASOUND COMPLETE COMPARISON:  05/19/2018 ultrasound and 12/14/2017 CT FINDINGS: Right Kidney: Renal measurements: 10.3 x 5.3 x 5.7 cm = volume: 169 mL. Moderate to severe RIGHT hydronephrosis now identified. RIGHT renal cysts are again noted, the largest measuring 7.4  cm. Echogenicity within normal limits. Left Kidney: Renal measurements: 11.8 x 6.5 x 5.9 cm = volume: 235 mL. Echogenicity within normal limits. LEFT renal cysts are again noted. No hydronephrosis identified. Bladder: New bladder wall nodules/masses are noted, some which contain internal vascular flow and compatible with malignancy. The largest nodule measures 2.4 cm anteriorly. Prostate enlargement is present. IMPRESSION: 1. Bladder wall masses/nodules with vascular flow now noted compatible with malignancy. The largest bladder wall mass measures 2.4 cm anteriorly. 2. New moderate to severe RIGHT hydronephrosis. Electronically Signed   By: Margarette Canada M.D.   On: 09/18/2018 13:07     Medications:     Scheduled Medications: . sodium chloride   Intravenous Once  . amiodarone  200 mg Oral BID  . atorvastatin  80 mg Oral QHS  . docusate sodium  200 mg Oral Daily  . lactose free nutrition  237 mL Oral BID BM  . levothyroxine  150 mcg Oral QAC breakfast  . multivitamin with minerals  1 tablet Oral Daily  . pantoprazole  40 mg Oral BID AC  . sodium chloride flush  10-40 mL Intracatheter Q12H  . sotalol  80 mg Oral BID  . zinc sulfate  220 mg Oral Daily    Infusions: . sodium chloride 10 mL/hr at 09/20/18 0500    PRN Medications: acetaminophen, iothalamate meglumine, promethazine, sodium chloride flush, temazepam   Patient Profile   [name] is a 76 y.o. male with h/o VT, PAD and severe systolic HF (EF 64-68%) due to mixed ischemic/nonischemic CM. He is s/p HM II LVAD implant for destination therapy on October 19, 2013. VAD implantation was complicated by VT, syncope, AF/Aflutter and RHF. Required short term milrinone.   Admitted for heparin bridge for bladder tumor resection on 09/19/18.  Assessment/Plan:    1. 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  - Had 2 BCG infusions with Dr. Gloriann Loan but discontinued due to  inability to tolerate - 7/12 s/p TURB with right ureteral stent placement.  - Renal u/s obtained and shows recurrent R hydronephrosis without BOO. There is evidence of recurrent bladder tumor as well. Discussed with Dr. Gloriann Loan personally. - Underwent recurrent bladder tumor resection 09/19/18 with Dr Gloriann Loan. Found to be large fast-growing tumor with obstruction of right ureteral orifice - Continue Foley. Will get repeat renal US. Ho heparin yet - Further plans per Urology - appreciate their help 2. Chronic systolic HF s/p HM-II LVAD 10/2014  - RHC in 1/19 with mildly low output. Started on milrinone without benefit.  - Volume status stable - Ramp echo 11/14 speed turned down to 8600. Moderate RV HK. VAD interrogated personally. Parameters stable. (LVAD speed reduced to 8800 in 10/17 due to RV failure) - s/p previous driveline repair. No pump stops - Coumadin and heparin on hold. Possible start heparin  low-dose tomorrow - VAD interrogated personally. Parameters stable. 3. 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 Required HD x1 - Creatininebaseline 1.4-1.5 - CR 2.16 -> 2.18 -> 2.26 -> 2.20  in setting of R hydronephrosis - Bladder tumor debulked but unable to find R ureteral orifice. Will repeat kidney u/s. Discussed possibility of perc tube. Will discuss further with Urology  4 h/o.RecurrentVTwith hypotension and low flow alarms on VAD: Underwent DC-CV 9/17, 08/17/17, and 04/01/18. S/p emergent DC-CV in ER 07/09/18  - Had several low flow alarms on 11/9, 11/4, and 10/31. ICD shows several runs of AT 10/24-10/28, all lasting about an hour. Nothing that correlates with low flow alarms.  -Quiescent VT onSotalol80mg BIDand amio - QTC slightly prolonged but stable. Previously reviewed with Dr. Caryl Comes and felt OK.  - Keep K > 4.0 Mg > 2.0 5. Atrial arrhythmias: Atrial fibrillation, atrial tachycardia. S/p DC-CV 06/04/14, DC-CV 03/02/15 and 01/19/16.  -Remains in  NSR. Device interrogation this admit shows multiple brief runs atrial tach 6. H/o Recurrent GIB - Most recent bleed 12/18. hgb 6.5, Transfused 5u. EGD with 2 AVMs treated with APC - Denies GI bleeding.  - Hgb 9.8  - Has been getting octreotide infusions but too expensive for him. Holding for now.     I reviewed the LVAD parameters from today, and compared the results to the patient's prior recorded data.  No programming changes were made.  The LVAD is functioning within specified parameters.  The patient performs LVAD self-test daily.  LVAD interrogation was negative for any significant power changes, alarms or PI events/speed drops.  LVAD equipment check completed and is in good working order.  Back-up equipment present.   LVAD education done on emergency procedures and precautions and reviewed exit site care.   Length of Stay: 3  Glori Bickers, MD 09/20/2018, 9:03 AM  VAD Team --- VAD ISSUES ONLY--- Pager [phone] (7am - 7am)  Advanced Heart Failure Team  Pager [phone] (M-F; 7a - 4p)  Please contact [facility] for night-coverage after hours (4p -7a ) and weekends on [email or web address]

## 2018-09-20 NOTE — Progress Notes (Signed)
ANTICOAGULATION CONSULT NOTE - Follow Up Consult  Pharmacy Consult for warfarin / heparin when INR <2 Indication: LVAD  Allergies  Allergen Reactions  . Demerol [Meperidine] Other (See Comments)    Paralysis/ Could only move eyes    Patient Measurements: Height: 6' 0.01" (182.9 cm) Weight: 163 lb 12.8 oz (74.3 kg) IBW/kg (Calculated) : 77.62 Heparin Dosing Weight: 77kg  Vital Signs: Temp: 97.7 F (36.5 C) (11/16 0706) Temp Source: Oral (11/16 0706) BP: 99/86 (11/16 0706) Pulse Rate: 75 (11/16 0706)  Labs: Recent Labs    09/18/18 0359 09/18/18 1338 09/18/18 2106 09/19/18 0418 09/20/18 0500  HGB 10.6*  --   --  10.3* 9.8*  HCT 33.6*  --   --  32.3* 31.6*  PLT 130*  --   --  126* 124*  LABPROT 24.0* 24.7* 23.6* 21.7* 15.9*  INR 2.18 2.26 2.14 1.92 1.28  HEPARINUNFRC  --   --   --   --  <0.10*  CREATININE 2.18* 2.19*  --  2.26* 2.20*    Estimated Creatinine Clearance: 29.1 mL/min (A) (by C-G formula based on SCr of 2.2 mg/dL (H)).   Medical History: Past Medical History:  Diagnosis Date  . Arthritis   . Automatic implantable cardioverter-defibrillator in situ    a. s/p Medtronic Evera device serial [id number]    . Bradycardia   . CAD (coronary artery disease)    a. s/p MI 1982;  s/p DES to CFX 2011;  s/p NSTEMI 4/12 in setting of VTach;  cath 7/12:  LM ok, LAD mid 30-40%, Dx's with 40%, mCFX stent patent, prox to mid RCA occluded with R to R and L to R collats.  Medical Tx was continued  . Chronic systolic heart failure ([place])    a. s/p LVAD 10/2013 for destination therapy  . CKD (chronic kidney disease)   . CVD (cerebrovascular disease)    a. s/p L CEA 2008  . Cytopenia   . Dysrhythmia    AFIB  . History of GI bleed    a. on ASA- started on plavix during 10/2015 admission for CVA  . HLD (hyperlipidemia)   . HTN (hypertension)   . Hypothyroidism   . Inappropriate therapy from implantable cardioverter-defibrillator    a. ATP for sinus tach SVT wavelet  identified SVT but was no  passive (2014)  . Ischemic cardiomyopathy   . 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 L Ear   . Myocardial infarction ([place])    1992  . PAF (paroxysmal atrial fibrillation) ([place])   . PVD (peripheral vascular disease) ([place])    a. 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 Shortness of breath dyspnea    W/ EXERTION   . Sleep apnea    NO CPAP NOW  HE SENT IT BACK YRS AGO  . Stroke [facility])    a. 10/2015 admission   . Ventricular tachycardia ([place])    a. s/p AICD;   h/o ICD shock;  Amiodarone Rx. S/P ablation in 2012   Assessment: 77 year old male with history of LVAD implant being admitted for hold of warfarin and  heparin bridge in anticipation of bladder tumor resection on 11/15.  Pt did receive vitamin K 2.5 mg po x1+ FFP + increased home Boost TID- which has some vitamin K.  S/p bladder tumor resection- ongoing hematuria- h/h  stable - hold AC for today and reassess in am.  INR 1.2  Goal of Therapy:  INR 2.0-2.5 Heparin level 0.3-0.5 units/ml Monitor platelets by anticoagulation protocol: Yes   Plan:  F/U [facility] plan is am Recheck INR with AM labs Monitor for signs/symptoms of bleeding  Bonnita Nasuti Pharm.D. CPP, BCPS Clinical Pharmacist [phone] 09/20/2018 9:50 AM

## 2018-09-21 ENCOUNTER — Encounter (HOSPITAL_COMMUNITY): Payer: Self-pay | Admitting: Urology

## 2018-09-21 DIAGNOSIS — N179 Acute kidney failure, unspecified: Secondary | ICD-10-CM

## 2018-09-21 DIAGNOSIS — C679 Malignant neoplasm of bladder, unspecified: Secondary | ICD-10-CM | POA: Diagnosis not present

## 2018-09-21 DIAGNOSIS — Z95811 Presence of heart assist device: Secondary | ICD-10-CM | POA: Diagnosis not present

## 2018-09-21 DIAGNOSIS — C672 Malignant neoplasm of lateral wall of bladder: Secondary | ICD-10-CM | POA: Diagnosis not present

## 2018-09-21 DIAGNOSIS — N1330 Unspecified hydronephrosis: Secondary | ICD-10-CM

## 2018-09-21 DIAGNOSIS — C670 Malignant neoplasm of trigone of bladder: Secondary | ICD-10-CM | POA: Diagnosis not present

## 2018-09-21 DIAGNOSIS — I5022 Chronic systolic (congestive) heart failure: Secondary | ICD-10-CM | POA: Diagnosis not present

## 2018-09-21 DIAGNOSIS — I471 Supraventricular tachycardia: Secondary | ICD-10-CM | POA: Diagnosis not present

## 2018-09-21 LAB — BASIC METABOLIC PANEL
Anion gap: 5 (ref 5–15)
BUN: 44 mg/dL — ABNORMAL HIGH (ref 8–23)
CALCIUM: 8.9 mg/dL (ref 8.9–10.3)
CO2: 25 mmol/L (ref 22–32)
CREATININE: 2.1 mg/dL — AB (ref 0.61–1.24)
Chloride: 107 mmol/L (ref 98–111)
GFR calc non Af Amer: 29 mL/min — ABNORMAL LOW (ref >60)
GFR, EST AFRICAN AMERICAN: 33 mL/min — AB (ref >60)
Glucose, Bld: 94 mg/dL (ref 70–99)
Potassium: 4.9 mmol/L (ref 3.5–5.1)
Sodium: 137 mmol/L (ref 135–145)

## 2018-09-21 LAB — PROTIME-INR
INR: 1.24
Prothrombin Time: 15.5 seconds — ABNORMAL HIGH (ref 11.4–15.2)

## 2018-09-21 LAB — CBC
HCT: 31.5 % — ABNORMAL LOW (ref 39.0–52.0)
Hemoglobin: 9.5 g/dL — ABNORMAL LOW (ref 13.0–17.0)
MCH: 29.4 pg (ref 26.0–34.0)
MCHC: 30.2 g/dL (ref 30.0–36.0)
MCV: 97.5 fL (ref 80.0–100.0)
NRBC: 0 % (ref 0.0–0.2)
PLATELETS: 117 10*3/uL — AB (ref 150–400)
RBC: 3.23 MIL/uL — ABNORMAL LOW (ref 4.22–5.81)
RDW: 16.7 % — AB (ref 11.5–15.5)
WBC: 7.8 10*3/uL (ref 4.0–10.5)

## 2018-09-21 LAB — LACTATE DEHYDROGENASE: LDH: 199 U/L — AB (ref 98–192)

## 2018-09-21 MED ORDER — HEPARIN (PORCINE) 25000 UT/250ML-% IV SOLN
600.0000 [IU]/h | INTRAVENOUS | Status: DC
  Administered 2018-09-21: 400 [IU]/h via INTRAVENOUS
  Filled 2018-09-21 (×2): qty 250

## 2018-09-21 NOTE — Progress Notes (Signed)
ANTICOAGULATION CONSULT NOTE - Follow Up Consult  Pharmacy Consult for warfarin / heparin when INR <2 Indication: LVAD  Allergies  Allergen Reactions  . Demerol [Meperidine] Other (See Comments)    Paralysis/ Could only move eyes    Patient Measurements: Height: 6' 0.01" (182.9 cm) Weight: 172 lb 2.9 oz (78.1 kg) IBW/kg (Calculated) : 77.62 Heparin Dosing Weight: 77kg  Vital Signs: Temp: 97.7 F (36.5 C) (11/17 1603) Temp Source: Oral (11/17 1603) BP: 88/75 (11/17 1603) Pulse Rate: 75 (11/17 1603)  Labs: Recent Labs    09/19/18 0418 09/20/18 0500 09/21/18 0637  HGB 10.3* 9.8* 9.5*  HCT 32.3* 31.6* 31.5*  PLT 126* 124* 117*  LABPROT 21.7* 15.9* 15.5*  INR 1.92 1.28 1.24  HEPARINUNFRC  --  <0.10*  --   CREATININE 2.26* 2.20* 2.10*    Estimated Creatinine Clearance: 31.8 mL/min (A) (by C-G formula based on SCr of 2.1 mg/dL (H)).   Medical History: Past Medical History:  Diagnosis Date  . Arthritis   . Automatic implantable cardioverter-defibrillator in situ    a. s/p Medtronic Evera device serial [id number]    . Bradycardia   . CAD (coronary artery disease)    a. s/p MI 1982;  s/p DES to CFX 2011;  s/p NSTEMI 4/12 in setting of VTach;  cath 7/12:  LM ok, LAD mid 30-40%, Dx's with 40%, mCFX stent patent, prox to mid RCA occluded with R to R and L to R collats.  Medical Tx was continued  . Chronic systolic heart failure ([place])    a. s/p LVAD 10/2013 for destination therapy  . CKD (chronic kidney disease)   . CVD (cerebrovascular disease)    a. s/p L CEA 2008  . Cytopenia   . Dysrhythmia    AFIB  . History of GI bleed    a. on ASA- started on plavix during 10/2015 admission for CVA  . HLD (hyperlipidemia)   . HTN (hypertension)   . Hypothyroidism   . Inappropriate therapy from implantable cardioverter-defibrillator    a. ATP for sinus tach SVT wavelet identified SVT but was no  passive (2014)  . Ischemic cardiomyopathy   . 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 L  Ear   . Myocardial infarction ([place])    1992  . PAF (paroxysmal atrial fibrillation) ([place])   . PVD (peripheral vascular disease) ([place])    a. 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 Shortness of breath dyspnea    W/ EXERTION   . Sleep apnea    NO CPAP NOW  HE SENT IT BACK YRS AGO  . Stroke [facility])    a. 10/2015 admission   . Ventricular tachycardia ([place])    a. s/p AICD;   h/o ICD shock;  Amiodarone Rx. S/P ablation in 2012   Assessment: 77 year old male with history of LVAD implant being admitted for hold of warfarin and  heparin bridge in anticipation of bladder tumor resection on 11/15.  Pt did receive vitamin K 2.5 mg po x1+ FFP + increased home Boost TID- which has some vitamin K.  S/p bladder tumor resection- ongoing hematuria- h/h stable - continue to hold warfarin until hematuria improved.  Will start low dose heparin drip with no dose titration to HL for now  and reassess in am.  INR 1.2  Goal of Therapy:  INR 2.0-2.5 Heparin level 0.3-0.5 units/ml Monitor platelets by anticoagulation protocol: Yes   Plan:  Heparin 400 uts/hr no titration tonight Check HL, INR with AM labs Monitor for signs/symptoms of bleeding  Bonnita Nasuti Pharm.D. CPP, BCPS Clinical Pharmacist [phone] 09/21/2018 5:51 PM

## 2018-09-21 NOTE — Progress Notes (Signed)
Patient ID: [name], male   DOB: 08-May-1941, 77 y.o.   MRN: [id number]    Assessment: Recurrent bladder cancer: This is been resected it appeared to be invasive at the time of surgery.  The presence of right hydronephrosis would also suggest invasive disease.  I discussed that fact with the patient today.  Hydronephrosis: Preoperatively he had right hydronephrosis and had a tumor involving the right ureteral orifice which was not identified intraoperatively.  A left retrograde pyelogram was performed and was completely normal.  A renal ultrasound done yesterday showed continued right hydronephrosis and was read as showing mild left hydronephrosis.  I reviewed the ultrasound images and find minimal left hydronephrosis.  If any exists its secondary to having the left ureter intubated for the retrograde pyelogram and is of no clinical significance.  His creatinine, although still elevated, is showing some slight improvement.  I still would not recommend placement of a nephrostomy tube at this time.  Gross hematuria: His hematuria does appear to be slowly clearing.  It is less bloody today.  There are no clots.  The leakage around his catheter is not urine but rather some blood secondary to recent placement of resectoscope for his TURBT.  The treatment is continued Foley catheter.   Plan:  1.  Continue Foley catheter drainage with as needed irrigation. 2.  Serial creatinines. 3.  Would recommend continuing to hold anticoagulation if not contraindicated from a cardiac standpoint to allow further clearance of his urine.   Subjective: Patient reports no complaints of flank pain.  It was reported he was having leakage around his catheter.  He did not describe experiencing bladder spasms or any suprapubic discomfort.  Objective: Vital signs in last 24 hours: Temp:  [97.4 F (36.3 C)-98 F (36.7 C)] 98 F (36.7 C) (11/17 0732) Pulse Rate:  [74-88] 74 (11/17 0732) Resp:  [13-20] 15 (11/17  0732) BP: (80-97)/(64-86) 97/86 (11/17 0732) SpO2:  [91 %-100 %] 100 % (11/17 0732) Weight:  [78.1 kg] 78.1 kg (11/17 0600)A  Intake/Output from previous day: 11/16 0701 - 11/17 0700 In: 1130 [P.O.:800; I.V.:270] Out: 1725 [Urine:1725] Intake/Output this shift: Total I/O In: 0  Out: 275 [Urine:275]  Past Medical History:  Diagnosis Date  . Arthritis   . Automatic implantable cardioverter-defibrillator in situ    a. s/p Medtronic Evera device serial [id number]    . Bradycardia   . CAD (coronary artery disease)    a. s/p MI 1982;  s/p DES to CFX 2011;  s/p NSTEMI 4/12 in setting of VTach;  cath 7/12:  LM ok, LAD mid 30-40%, Dx's with 40%, mCFX stent patent, prox to mid RCA occluded with R to R and L to R collats.  Medical Tx was continued  . Chronic systolic heart failure ([place])    a. s/p LVAD 10/2013 for destination therapy  . CKD (chronic kidney disease)   . CVD (cerebrovascular disease)    a. s/p L CEA 2008  . Cytopenia   . Dysrhythmia    AFIB  . History of GI bleed    a. on ASA- started on plavix during 10/2015 admission for CVA  . HLD (hyperlipidemia)   . HTN (hypertension)   . Hypothyroidism   . Inappropriate therapy from implantable cardioverter-defibrillator    a. ATP for sinus tach SVT wavelet identified SVT but was no  passive (2014)  . Ischemic cardiomyopathy   . 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 L Ear   . Myocardial infarction (  [place])    1992  . PAF (paroxysmal atrial fibrillation) ([place])   . PVD (peripheral vascular disease) ([place])    a. 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 Shortness of breath dyspnea    W/ EXERTION   . Sleep apnea    NO CPAP NOW  HE SENT IT BACK YRS AGO  . Stroke [facility])    a. 10/2015 admission   . Ventricular tachycardia ([place])    a. s/p AICD;   h/o ICD shock;  Amiodarone Rx. S/P ablation in 2012    Physical Exam:  General: Awake, alert and in no  apparent distress Lungs: Normal respiratory effort, chest expands symmetrically.  Abdomen: Soft, non-tender & non-distended. GU: Foley catheter in place with dried blood around meatus and catheter.  Foley catheter is draining with no clots and is "cherry Kool-Aid color".  Lab Results: Recent Labs    09/19/18 0418 09/20/18 0500 09/21/18 0637  WBC 7.3 10.4 7.8  HGB 10.3* 9.8* 9.5*  HCT 32.3* 31.6* 31.5*   BMET Recent Labs    09/20/18 0500 09/21/18 0637  NA 137 137  K 4.8 4.9  CL 107 107  CO2 23 25  GLUCOSE 122* 94  BUN 50* 44*  CREATININE 2.20* 2.10*  CALCIUM 8.6* 8.9   No results for input(s): LABURIN in the last 72 hours. Results for orders placed or performed during the hospital encounter of 09/17/18  MRSA PCR Screening     Status: None   Collection Time: 09/17/18  1:36 PM  Result Value Ref Range Status   MRSA by PCR NEGATIVE NEGATIVE Final    Comment:        The GeneXpert MRSA Assay (FDA approved for NASAL specimens only), is one component of a comprehensive MRSA colonization surveillance program. It is not intended to diagnose MRSA infection nor to guide or monitor treatment for MRSA infections. Performed at [facility], [place] [address], [place], [place] [address]    *Note: Due to a large number of results and/or encounters for the requested time period, some results have not been displayed. A complete set of results can be found in Results Review.    Studies/Results: US Renal  Result Date: 09/20/2018 CLINICAL DATA:  Recent bladder tumor debulking, evaluate for possible hydronephrosis EXAM: RENAL / URINARY TRACT ULTRASOUND COMPLETE COMPARISON:  09/18/2018 FINDINGS: Right Kidney: Renal measurements: 10.9 x 7.0 x 8.0 cm = volume: 319 mL. Moderate hydronephrosis is noted. Large renal cyst is noted measuring 7.1 cm. Left Kidney: Renal measurements: 8.1 x 4.5 x 6.7 = volume: 128 mL. Mild left hydronephrosis is noted less than that seen on the right. Two  cysts are noted on the left. The largest of these measures 3.4 cm. Bladder: Decompressed by Foley catheter. IMPRESSION: Bilateral hydronephrosis right greater than left the right-sided hydronephrosis is slightly improved from the prior exam. The left hydronephrosis is increased from the prior study. Stable renal cysts. Electronically Signed   By: Inez Catalina M.D.   On: 09/20/2018 13:24      Ambur [place] C 09/21/2018, 9:30 AM

## 2018-09-21 NOTE — Progress Notes (Signed)
Advanced Heart Failure VAD Team Note  PCP-Cardiologist: No primary care provider on file.   Subjective:    Admitted for resection of recurrent bladder tumor. Renal u/s obtained and shows recurrent R hydronephrosis without BOO. There is evidence of recurrent bladder tumor as well.  Ramp echo 11/14 speed turned down to 8600 Went to OR 11/15 for bladder tumor resection. Multiple large tumors. Unable to uncover R ureteral orifice.    Repeat renal u/s yesterday shows moderate R hydronephrosis (slightly improved) and new left mild hydro. Continue to have hematuria but it is starting to clear a bit. No pain or clots. Creatinine slightly improved. One low flow alarm on VAD overnight  Remains off heparin. LDH 199   LVAD Interrogation HM 2: Speed: 8600 Flow: 3.6 PI: 4.3  Power: 4.2. VAD interrogated personally. Parameters stable. One low flow alarm overnight   Objective:    Vital Signs:   Temp:  [97.4 F (36.3 C)-98 F (36.7 C)] 97.6 F (36.4 C) (11/17 1118) Pulse Rate:  [74-87] 81 (11/17 1118) Resp:  [13-20] 20 (11/17 1118) BP: (80-124)/(69-99) 124/99 (11/17 1118) SpO2:  [93 %-100 %] 94 % (11/17 1118) Weight:  [78.1 kg] 78.1 kg (11/17 0600) Last BM Date: 09/18/18 Mean arterial Pressure 70-80s  Intake/Output:   Intake/Output Summary (Last 24 hours) at 09/21/2018 1156 Last data filed at 09/21/2018 1100 Gross per 24 hour  Intake 1198 ml  Output 1600 ml  Net -402 ml     Physical Exam   General:  NAD.  HEENT: normal  Neck: supple. JVP not elevated.  Carotids 2+ bilat; no bruits. No lymphadenopathy or thryomegaly appreciated. Cor: LVAD hum.  Lungs: Clear. Abdomen: soft, nontender, non-distended. No hepatosplenomegaly. No bruits or masses. Good bowel sounds. Driveline site clean. Anchor in place.  Extremities: no cyanosis, clubbing, rash. Warm no edema  Foley with light red urine Neuro: alert & oriented x 3. No focal deficits. Moves all 4 without problem    Telemetry    A paced 70-80s + PVCs. Personally reviewed   Labs   Basic Metabolic Panel: Recent Labs  Lab 09/17/18 1343 09/18/18 0359 09/18/18 1338 09/19/18 0418 09/20/18 0500 09/21/18 0637  NA 134* 134* 134* 136 137 137  K 4.3 4.4 4.3 4.7 4.8 4.9  CL 105 105 106 106 107 107  CO2 22 22 22 23 23 25   GLUCOSE 131* 99 121* 100* 122* 94  BUN 63* 55* 51* 54* 50* 44*  CREATININE 2.16* 2.18* 2.19* 2.26* 2.20* 2.10*  CALCIUM 8.8* 8.9 8.8* 8.9 8.6* 8.9  MG 2.1  --   --   --   --   --     Liver Function Tests: Recent Labs  Lab 09/17/18 1343  AST 36  ALT 33  ALKPHOS 172*  BILITOT 0.6  PROT 7.1  ALBUMIN 3.3*   No results for input(s): LIPASE, AMYLASE in the last 168 hours. No results for input(s): AMMONIA in the last 168 hours.  CBC: Recent Labs  Lab 09/17/18 1343 09/18/18 0359 09/19/18 0418 09/20/18 0500 09/21/18 0637  WBC 6.6 6.5 7.3 10.4 7.8  NEUTROABS 4.3  --   --   --   --   HGB 11.0* 10.6* 10.3* 9.8* 9.5*  HCT 35.8* 33.6* 32.3* 31.6* 31.5*  MCV 96.2 94.4 94.7 96.6 97.5  PLT 149* 130* 126* 124* 117*    INR: Recent Labs  Lab 09/18/18 1338 09/18/18 2106 09/19/18 0418 09/20/18 0500 09/21/18 0637  INR 2.26 2.14 1.92 1.28 1.24  Other results:  EKG:    Imaging   Dg Retrograde Pyelogram  Result Date: 09/19/2018 CLINICAL DATA:  Left retrograde pyelogram EXAM: RETROGRADE PYELOGRAM COMPARISON:  05/16/2018 FINDINGS: Spot fluoroscopic imaging performed during the operative procedure. Retrograde left ureteral injection performed. Left ureter and collecting system appear patent. No obstruction, significant strictures, or filling defect. No hydronephrosis. Bifid left renal pelvis present. IMPRESSION: Patent left ureter without obstruction or hydronephrosis. Electronically Signed   By: Jerilynn Mages.  Shick M.D.   On: 09/19/2018 15:08   US Renal  Result Date: 09/20/2018 CLINICAL DATA:  Recent bladder tumor debulking, evaluate for possible hydronephrosis EXAM: RENAL / URINARY  TRACT ULTRASOUND COMPLETE COMPARISON:  09/18/2018 FINDINGS: Right Kidney: Renal measurements: 10.9 x 7.0 x 8.0 cm = volume: 319 mL. Moderate hydronephrosis is noted. Large renal cyst is noted measuring 7.1 cm. Left Kidney: Renal measurements: 8.1 x 4.5 x 6.7 = volume: 128 mL. Mild left hydronephrosis is noted less than that seen on the right. Two cysts are noted on the left. The largest of these measures 3.4 cm. Bladder: Decompressed by Foley catheter. IMPRESSION: Bilateral hydronephrosis right greater than left the right-sided hydronephrosis is slightly improved from the prior exam. The left hydronephrosis is increased from the prior study. Stable renal cysts. Electronically Signed   By: Inez Catalina M.D.   On: 09/20/2018 13:24     Medications:     Scheduled Medications: . sodium chloride   Intravenous Once  . amiodarone  200 mg Oral BID  . atorvastatin  80 mg Oral QHS  . docusate sodium  200 mg Oral Daily  . lactose free nutrition  237 mL Oral BID BM  . levothyroxine  150 mcg Oral QAC breakfast  . multivitamin with minerals  1 tablet Oral Daily  . pantoprazole  40 mg Oral BID AC  . sodium chloride flush  10-40 mL Intracatheter Q12H  . sotalol  80 mg Oral BID  . zinc sulfate  220 mg Oral Daily    Infusions: . sodium chloride 10 mL/hr at 09/21/18 0600    PRN Medications: acetaminophen, iothalamate meglumine, promethazine, sodium chloride flush, temazepam   Patient Profile   [name] is a 77 y.o. male with h/o VT, PAD and severe systolic HF (EF 42-35%) due to mixed ischemic/nonischemic CM. He is s/p HM II LVAD implant for destination therapy on October 19, 2013. VAD implantation was complicated by VT, syncope, AF/Aflutter and RHF. Required short term milrinone.   Admitted for heparin bridge for bladder tumor resection on 09/19/18.  Assessment/Plan:    1. 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  - Had 2  BCG infusions with Dr. Gloriann Loan but discontinued due to inability to tolerate - 7/12 s/p TURB with right ureteral stent placement.  - Renal u/s 11/14 with recurrent R hydronephrosis without BOO. There is evidence of recurrent bladder tumor as well - Underwent recurrent bladder tumor resection 09/19/18 with Dr Gloriann Loan. Found to be large fast-growing tumor with obstruction of right ureteral orifice - Repeat renal u/s 11/16 shows moderate R hydronephrosis (slightly improved) and new left mild hydro.  - Continue Foley. - Creatinine improved slightly.  - Will start low-dose heparin and see if he can toelrat.  - Further plans per Urology - appreciate their help 2. Chronic systolic HF s/p HM-II LVAD 10/2014  - RHC in 1/19 with mildly low output. Started on milrinone without benefit.  - Volume status stable - Ramp echo 11/14  speed turned down to 8600. Moderate RV HK. VAD interrogated personally. Parameters stable. (LVAD speed reduced to 8800 in 10/17 due to RV failure) - s/p previous driveline repair. No pump stops - Coumadin and heparin on hold. Will start heparin low-dose. dbph - VAD interrogated personally. One low flow alarm.  3. 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 Required HD x1 - Creatininebaseline 1.4-1.5 - CR 2.16 -> 2.18 -> 2.26 -> 2.20 -> 2.10  in setting of R> L hydronephrosis - Bladder tumor debulked but unable to find R ureteral orifice. Discussed possibility of perc tube with Urology but no indication currently. Mr. Larch says he would not want externalized drain 4 h/o.RecurrentVTwith hypotension and low flow alarms on VAD: Underwent DC-CV 9/17, 08/17/17, and 04/01/18. S/p emergent DC-CV in ER 07/09/18  - Had several low flow alarms on 11/9, 11/4, and 10/31. ICD shows several runs of AT 10/24-10/28, all lasting about an hour. Nothing that correlates with low flow alarms.  -Quiescent VT onSotalol80mg BIDand amio - QTC slightly prolonged but  stable. Previously reviewed with Dr. Caryl Comes and felt OK.  - Keep K > 4.0 Mg > 2.0 5. Atrial arrhythmias: Atrial fibrillation, atrial tachycardia. S/p DC-CV 06/04/14, DC-CV 03/02/15 and 01/19/16.  -Remains in NSR. Device interrogation this admit shows multiple brief runs atrial tach 6. H/o Recurrent GIB - Most recent bleed 12/18. hgb 6.5, Transfused 5u. EGD with 2 AVMs treated with APC - Denies GI bleeding.  - Hgb 9.8 -> 9.5 - Has been getting octreotide infusions but too expensive for him. Holding for now.    I reviewed the LVAD parameters from today, and compared the results to the patient's prior recorded data.  No programming changes were made.  The LVAD is functioning within specified parameters.  The patient performs LVAD self-test daily.  LVAD interrogation was negative for any significant power changes, alarms or PI events/speed drops.  LVAD equipment check completed and is in good working order.  Back-up equipment present.   LVAD education done on emergency procedures and precautions and reviewed exit site care.   Length of Stay: [place], MD 09/21/2018, 11:56 AM  VAD Team --- VAD ISSUES ONLY--- Pager [phone] (7am - 7am)  Advanced Heart Failure Team  Pager [phone] (M-F; 7a - 4p)  Please contact [facility] for night-coverage after hours (4p -7a ) and weekends on [email or web address]

## 2018-09-22 ENCOUNTER — Inpatient Hospital Stay (HOSPITAL_COMMUNITY): Payer: Medicare Other

## 2018-09-22 DIAGNOSIS — C672 Malignant neoplasm of lateral wall of bladder: Secondary | ICD-10-CM | POA: Diagnosis not present

## 2018-09-22 DIAGNOSIS — Z95811 Presence of heart assist device: Secondary | ICD-10-CM | POA: Diagnosis not present

## 2018-09-22 DIAGNOSIS — I471 Supraventricular tachycardia: Secondary | ICD-10-CM | POA: Diagnosis not present

## 2018-09-22 DIAGNOSIS — C679 Malignant neoplasm of bladder, unspecified: Secondary | ICD-10-CM | POA: Diagnosis not present

## 2018-09-22 DIAGNOSIS — C670 Malignant neoplasm of trigone of bladder: Secondary | ICD-10-CM | POA: Diagnosis not present

## 2018-09-22 DIAGNOSIS — I5022 Chronic systolic (congestive) heart failure: Secondary | ICD-10-CM | POA: Diagnosis not present

## 2018-09-22 LAB — CBC
HCT: 31.4 % — ABNORMAL LOW (ref 39.0–52.0)
Hemoglobin: 9.5 g/dL — ABNORMAL LOW (ref 13.0–17.0)
MCH: 29.5 pg (ref 26.0–34.0)
MCHC: 30.3 g/dL (ref 30.0–36.0)
MCV: 97.5 fL (ref 80.0–100.0)
NRBC: 0 % (ref 0.0–0.2)
PLATELETS: 128 10*3/uL — AB (ref 150–400)
RBC: 3.22 MIL/uL — AB (ref 4.22–5.81)
RDW: 16.5 % — ABNORMAL HIGH (ref 11.5–15.5)
WBC: 6.8 10*3/uL (ref 4.0–10.5)

## 2018-09-22 LAB — BASIC METABOLIC PANEL
ANION GAP: 5 (ref 5–15)
BUN: 43 mg/dL — ABNORMAL HIGH (ref 8–23)
CO2: 26 mmol/L (ref 22–32)
Calcium: 8.6 mg/dL — ABNORMAL LOW (ref 8.9–10.3)
Chloride: 104 mmol/L (ref 98–111)
Creatinine, Ser: 1.97 mg/dL — ABNORMAL HIGH (ref 0.61–1.24)
GFR, EST AFRICAN AMERICAN: 36 mL/min — AB (ref >60)
GFR, EST NON AFRICAN AMERICAN: 31 mL/min — AB (ref >60)
Glucose, Bld: 100 mg/dL — ABNORMAL HIGH (ref 70–99)
Potassium: 5 mmol/L (ref 3.5–5.1)
SODIUM: 135 mmol/L (ref 135–145)

## 2018-09-22 LAB — PROTIME-INR
INR: 1.34
PROTHROMBIN TIME: 16.5 s — AB (ref 11.4–15.2)

## 2018-09-22 LAB — HEPARIN LEVEL (UNFRACTIONATED)

## 2018-09-22 LAB — LACTATE DEHYDROGENASE: LDH: 199 U/L — ABNORMAL HIGH (ref 98–192)

## 2018-09-22 NOTE — Anesthesia Postprocedure Evaluation (Signed)
Anesthesia Post Note  Patient: [name]  Procedure(s) Performed: TRANSURETHRAL RESECTION OF BLADDER TUMOR (N/A Bladder) left  RETROGRADE PYELOGRAM (Left Bladder) CYSTOGRAM (N/A Bladder)     Patient location during evaluation: PACU Anesthesia Type: General Level of consciousness: awake and alert Pain management: pain level controlled Vital Signs Assessment: post-procedure vital signs reviewed and stable Respiratory status: spontaneous breathing, nonlabored ventilation, respiratory function stable and patient connected to nasal cannula oxygen Cardiovascular status: blood pressure returned to baseline and stable Postop Assessment: no apparent nausea or vomiting Anesthetic complications: no    Last Vitals:  Vitals:   09/22/18 0900 09/22/18 1300  BP: (!) 81/63 (!) 75/62  Pulse: 88 82  Resp: 17 16  Temp: 36.6 C 36.7 C  SpO2: 96% 95%    Last Pain:  Vitals:   09/22/18 1300  TempSrc: Oral  PainSc: 0-No pain                 Vivan Agostino

## 2018-09-22 NOTE — Progress Notes (Signed)
Urology Inpatient Progress Report  LVAD BLADDER CANCER  Procedure(s): TRANSURETHRAL RESECTION OF BLADDER TUMOR left  RETROGRADE PYELOGRAM CYSTOGRAM  3 Days Post-Op   Intv/Subj: No acute events overnight. Patient is without complaint. Still having some hematuria.  Hemoglobin is stable however.  He is back on heparin. Creatinine is stable.  Principal Problem:   Bladder cancer [facility]) Active Problems:   Atrial tachycardia (HCC)   LVAD (left ventricular assist device) present ([place])   AKI (acute kidney injury) ([place])   Hydronephrosis of right kidney  Current Facility-Administered Medications  Medication Dose Route Frequency Provider Last Rate Last Dose  . 0.9 %  sodium chloride infusion (Manually program via Guardrails IV Fluids)   Intravenous Once Bensimhon, Shaune Pascal, MD      . 0.9 %  sodium chloride infusion   Intravenous Continuous Oleta Mouse, MD 10 mL/hr at 09/21/18 1830    . acetaminophen (TYLENOL) tablet 650 mg  650 mg Oral Q4H PRN Georgiana Shore, NP      . amiodarone (PACERONE) tablet 200 mg  200 mg Oral BID Georgiana Shore, NP   200 mg at 09/22/18 0955  . atorvastatin (LIPITOR) tablet 80 mg  80 mg Oral QHS Georgiana Shore, NP   80 mg at 09/21/18 2156  . docusate sodium (COLACE) capsule 200 mg  200 mg Oral Daily Georgiana Shore, NP   200 mg at 09/22/18 0955  . heparin ADULT infusion 100 units/mL (25000 units/242mL sodium chloride 0.45%)  400 Units/hr Intravenous Continuous Bensimhon, Shaune Pascal, MD 4 mL/hr at 09/22/18 0403 400 Units/hr at 09/22/18 0403  . iothalamate meglumine (CYSTO-CONRAY II) 17.2 % solution 250 mL  250 mL Intravesical Once PRN Marton Redwood III, MD      . lactose free nutrition (BOOST PLUS) liquid 237 mL  237 mL Oral BID BM Bensimhon, Shaune Pascal, MD   237 mL at 09/22/18 1005  . levothyroxine (SYNTHROID, LEVOTHROID) tablet 150 mcg  150 mcg Oral QAC breakfast Georgiana Shore, NP   150 mcg at 09/22/18 4034  . multivitamin with minerals tablet 1 tablet  1  tablet Oral Daily Georgiana Shore, NP   1 tablet at 09/22/18 0955  . pantoprazole (PROTONIX) EC tablet 40 mg  40 mg Oral BID AC Georgiana Shore, NP   40 mg at 09/22/18 7425  . promethazine (PHENERGAN) tablet 12.5 mg  12.5 mg Oral Q6H PRN Georgiana Shore, NP      . sodium chloride flush (NS) 0.9 % injection 10-40 mL  10-40 mL Intracatheter Q12H Bensimhon, Shaune Pascal, MD   10 mL at 09/22/18 0958  . sodium chloride flush (NS) 0.9 % injection 10-40 mL  10-40 mL Intracatheter PRN Bensimhon, Shaune Pascal, MD      . sotalol (BETAPACE) tablet 80 mg  80 mg Oral BID Georgiana Shore, NP   80 mg at 09/22/18 0954  . temazepam (RESTORIL) capsule 30 mg  30 mg Oral QHS PRN Georgiana Shore, NP   30 mg at 09/21/18 2155  . zinc sulfate capsule 220 mg  220 mg Oral Daily Bensimhon, Shaune Pascal, MD   220 mg at 09/22/18 0953     Objective: Vital: Vitals:   09/22/18 0000 09/22/18 0350 09/22/18 0400 09/22/18 0900  BP:  (!) 86/74  (!) 81/63  Pulse: 88 (!) 109 94   Resp: 15 (!) 21 20   Temp:  97.6 F (36.4 C)  97.9 F (36.6 C)  TempSrc:  Oral  Oral  SpO2: 94% 90% 94%   Weight:  78.7 kg    Height:       I/Os: I/O last 3 completed shifts: In: 1597.1 [P.O.:1360; I.V.:237.1] Out: 3500 [Urine:3500]  Physical Exam:  General: Patient is in no apparent distress Lungs: Normal respiratory effort, chest expands symmetrically. GI: The abdomen is soft and nontender without mass. Foley: Draining light red urine but thin Ext: lower extremities symmetric  Lab Results: Recent Labs    09/20/18 0500 09/21/18 0637 09/22/18 0439  WBC 10.4 7.8 6.8  HGB 9.8* 9.5* 9.5*  HCT 31.6* 31.5* 31.4*   Recent Labs    09/20/18 0500 09/21/18 0637 09/22/18 0439  NA 137 137 135  K 4.8 4.9 5.0  CL 107 107 104  CO2 23 25 26   GLUCOSE 122* 94 100*  BUN 50* 44* 43*  CREATININE 2.20* 2.10* 1.97*  CALCIUM 8.6* 8.9 8.6*   Recent Labs    09/20/18 0500 09/21/18 0637 09/22/18 0439  INR 1.28 1.24 1.34   No results for input(s):  LABURIN in the last 72 hours. Results for orders placed or performed during the hospital encounter of 09/17/18  MRSA PCR Screening     Status: None   Collection Time: 09/17/18  1:36 PM  Result Value Ref Range Status   MRSA by PCR NEGATIVE NEGATIVE Final    Comment:        The GeneXpert MRSA Assay (FDA approved for NASAL specimens only), is one component of a comprehensive MRSA colonization surveillance program. It is not intended to diagnose MRSA infection nor to guide or monitor treatment for MRSA infections. Performed at [facility], [place] [address] [place], [place] [address]    *Note: Due to a large number of results and/or encounters for the requested time period, some results have not been displayed. A complete set of results can be found in Results Review.    Studies/Results: US Renal  Result Date: 09/20/2018 CLINICAL DATA:  Recent bladder tumor debulking, evaluate for possible hydronephrosis EXAM: RENAL / URINARY TRACT ULTRASOUND COMPLETE COMPARISON:  09/18/2018 FINDINGS: Right Kidney: Renal measurements: 10.9 x 7.0 x 8.0 cm = volume: 319 mL. Moderate hydronephrosis is noted. Large renal cyst is noted measuring 7.1 cm. Left Kidney: Renal measurements: 8.1 x 4.5 x 6.7 = volume: 128 mL. Mild left hydronephrosis is noted less than that seen on the right. Two cysts are noted on the left. The largest of these measures 3.4 cm. Bladder: Decompressed by Foley catheter. IMPRESSION: Bilateral hydronephrosis right greater than left the right-sided hydronephrosis is slightly improved from the prior exam. The left hydronephrosis is increased from the prior study. Stable renal cysts. Electronically Signed   By: Inez Catalina M.D.   On: 09/20/2018 13:24    Assessment: Bladder cancer, right hydronephrosis Procedure(s): TRANSURETHRAL RESECTION OF BLADDER TUMOR left  RETROGRADE PYELOGRAM CYSTOGRAM, 3 Days Post-Op  doing well.  Plan: Creatinine is stable.  He does not want a nephrostomy  tube.  Agree with trying to be minimally invasive given his multiple comorbidities.  Obtain CT of the abdomen and pelvis without contrast to rule out lymphadenopathy secondary to bladder cancer given the invasive appearance on CT scan.  This will aid in future planning for whether he would be a good candidate for palliative radiation.  Unfortunately with his multiple comorbidities, cystectomy is not an option for treatment.  He would likely not tolerate chemotherapy as well.  Pathology pending.  Continue Foley catheter for now.  Nursing may irrigate  catheter as needed.   Link Snuffer, MD Urology 09/22/2018, 10:21 AM

## 2018-09-22 NOTE — Progress Notes (Addendum)
ANTICOAGULATION CONSULT NOTE - Follow Up Consult  Pharmacy Consult for warfarin / heparin Indication: LVAD  Allergies  Allergen Reactions  . Demerol [Meperidine] Other (See Comments)    Paralysis/ Could only move eyes    Patient Measurements: Height: 6' 0.01" (182.9 cm) Weight: 173 lb 8 oz (78.7 kg) IBW/kg (Calculated) : 77.62 Heparin Dosing Weight: 77kg  Vital Signs: Temp: 97.9 F (36.6 C) (11/18 0900) Temp Source: Oral (11/18 0900) BP: 81/63 (11/18 0900) Pulse Rate: 88 (11/18 0900)  Labs: Recent Labs    09/20/18 0500 09/21/18 0637 09/22/18 0439 09/22/18 0440  HGB 9.8* 9.5* 9.5*  --   HCT 31.6* 31.5* 31.4*  --   PLT 124* 117* 128*  --   LABPROT 15.9* 15.5* 16.5*  --   INR 1.28 1.24 1.34  --   HEPARINUNFRC <0.10*  --   --  <0.10*  CREATININE 2.20* 2.10* 1.97*  --     Estimated Creatinine Clearance: 33.9 mL/min (A) (by C-G formula based on SCr of 1.97 mg/dL (H)).   Medical History: Past Medical History:  Diagnosis Date  . Arthritis   . Automatic implantable cardioverter-defibrillator in situ    a. s/p Medtronic Evera device serial [id number]    . Bradycardia   . CAD (coronary artery disease)    a. s/p MI 1982;  s/p DES to CFX 2011;  s/p NSTEMI 4/12 in setting of VTach;  cath 7/12:  LM ok, LAD mid 30-40%, Dx's with 40%, mCFX stent patent, prox to mid RCA occluded with R to R and L to R collats.  Medical Tx was continued  . Chronic systolic heart failure ([place])    a. s/p LVAD 10/2013 for destination therapy  . CKD (chronic kidney disease)   . CVD (cerebrovascular disease)    a. s/p L CEA 2008  . Cytopenia   . Dysrhythmia    AFIB  . History of GI bleed    a. on ASA- started on plavix during 10/2015 admission for CVA  . HLD (hyperlipidemia)   . HTN (hypertension)   . Hypothyroidism   . Inappropriate therapy from implantable cardioverter-defibrillator    a. ATP for sinus tach SVT wavelet identified SVT but was no  passive (2014)  . Ischemic cardiomyopathy    . 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 L Ear   . Myocardial infarction ([place])    1992  . PAF (paroxysmal atrial fibrillation) ([place])   . PVD (peripheral vascular disease) ([place])    a. 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 Shortness of breath dyspnea    W/ EXERTION   . Sleep apnea    NO CPAP NOW  HE SENT IT BACK YRS AGO  . Stroke [facility])    a. 10/2015 admission   . Ventricular tachycardia ([place])    a. s/p AICD;   h/o ICD shock;  Amiodarone Rx. S/P ablation in 2012   Assessment: 77 year old male with history of LVAD implant being admitted for hold of warfarin and heparin bridge in anticipation of bladder tumor resection on 11/15.  Pt did receive vitamin K 2.5 mg po x1+ FFP + increased home Boost TID- which has some vitamin K.  Continues to have some hematuria, but this is improving. Hemoglobin is stable. Will continue to hold warfarin until hematuria resolves and per Dr. Haroldine Laws will increase  heparin to 600 units/hr with no dose titration to HL. INR up slightly to 1.34.  Goal of Therapy:  INR 2.0-2.5 Heparin level 0.3-0.5 units/ml Monitor platelets by anticoagulation protocol: Yes   Plan:  Heparin 600 uts/hr no titration tonight Continue to hold warfarin Check HL, INR with AM labs Monitor for signs/symptoms of bleeding  Jackson Latino, PharmD PGY1 Pharmacy Resident Phone [phone] 09/22/2018     11:19 AM

## 2018-09-22 NOTE — Progress Notes (Signed)
LVAD Coordinator Rounding Note:  Admitted on 09/17/18 for heparin bridge for bladder tumor resection scheduled on Friday 11/15.   HM II Implanted 10/19/13 by Dr. Cyndia Bent under Destination Therapy criteria.  Pt lying in bed this morning. Pt states that he does not want a nephrostomy tube.  Vital signs: Temp: 97.9 HR: 88 Doppler Pressure:72 Automatic BP:81/63 (69) O2 Sat: 96% on RA  Weight: 77.3>76.4>76.2>78.7kg  LVAD interrogation reveals:  Speed: 8600 Flow:3.7 Power: 4.3w PI: 4.8  Alarms: no alarms overnight 11/17: 1 LF 11/9: 36 Low flows  11/8: 3 LF  11/7 1 LF  11/4 14 LF  10/31- 4 LF   Events:none  Fixed speed: 8600 Low speed limit: 8000  Drive Line: Existing VAD dressing removed and site care performed using sterile technique. Drive line exit site cleaned with Chlora prep applicators x 2, allowed to dry, and Sorbaview dressing with bio patch re-applied. Exit site healed and incorporated, the velour is fully implanted at exit site. No redness, tenderness, drainage, foul odor or rash noted. Drive line anchor re-applied. Next dressing change due 09/29/18.  Labs:  LDH trend: 261>219>208>199  INR trend:  2.5>2.18>1.92>1.34  Creat trend: 1.55>2.18>2.26>1.97  Anticoagulation Plan: - INR Goal: 2.0 - 2.5 - ASA Dose: no ASA (removed 02/2017 due to GI bleed) - Heparin gtt per pharmacy  Device: -Dual Medtronic ICD -Therapies: on at 200 bpm - Last device check 10/02/17  Significant Events on VAD Support:  09/01/14>>ureteral stone with cystoscopy and flexible ureteroscopy with lithotripsy and basket extraction 02/2015 >>GIB with 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 Started on Plavix 3/17>>Lumbar fusion L3-L5 9/17>>VT with DC-CV; amio increased 10/17>>ramp with RV failure; speed decreased to  8800 02/2017> GIB (removed ASA, scopes negative) 03/2017>percutaneous lead repair after pump stops  10/18>>Slow VT. Cardioverted in ED and amiodarone uptitrated 12/18>>melena with scope. Hgb 6.5. EGD with 2 AVMs with APC 12/18>>recurrent hematuria. 6 mm left ureteral stone 1/19>>7 mm left ureteral stone removal with laser lithotripsy; left ureteral stent. Resection of 6 cm bladder tumor    Plan/Recommendations:  1. Please call VAD pager if any issues with VAD equipment or questions, concerns.    Tanda Rockers RN [place] Coordinator  Office: [phone]  24/7 Pager: [phone]

## 2018-09-22 NOTE — Discharge Summary (Addendum)
Advanced Heart Failure Team  Discharge Summary   Patient ID: [name] MRN: [id number], DOB/AGE: 79/20/1942 79 y.o. Admit date: 09/17/2018 D/C date:     10/01/2018    Primary Discharge Diagnoses:  1. Recurrent renal stone and bladder tumor with hydronephrosis 2. Chronic systolic HF s/p HM II LVAD 10/2014 3. AKI on CKD 4. Hx of recurrent VT with hypotension and low flow alarms on VAD 5. Atrial arrhythmias 6. Hx of recurrent GIB 7. Severe hematuria with acute blood loss anemia  Hospital Course:  [name] McBrideis a 77 y.o.malewith h/o VT, PAD and severe systolic HF (EF 63-14%) due to mixed ischemic/nonischemic CM. He is s/p HM II LVAD implant for destination therapy on October 19, 2013. VAD implantation was complicated by VT, syncope, AF/Aflutter and RHF. Required short term milrinone.    He was admitted 09/17/2018 for heparin bridge for recurrent bladder tumor resection on 09/19/18. Coumadin held on admit and once INR dropped heparin drip was started. Creatinine elevated to 2.1 on admit. Renal US showed recurrent right hydronephrosis without BOO and evidence of recurrent bladder tumor. Underwent recurrent bladder tumor resection 09/19/18 with Dr Gloriann Loan. Found to be large fast-growing tumor with obstruction of right ureteral orifice (unable to uncover right ureteral orifice). Repeat renal US showed slightly improved right hydronephrosis and new left mild hydronephrosis. Urology offered nephrostomy tube, but he declined. CT  abdomen completed and showed bilateral hydronephrosis L>R and was negative for locally invasive disease. 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   He had urinary retention sofoley cath placed. This was later removed but he had catheter placed again. He had large clots so he started continuous irrigation. Dr Gloriann Loan and Dr Tammi Klippel recommended a few days on radiation due to ongoing hematuria. He under went  radiation on 11/25, 11/26, and 11/27. Hemoglobin dropped down to 7.8 and Hematuria resolved so foley was removed and he was able to void. He will follow up with Dr Tammi Klippel in January.   Ramp echo done 11/14 and speed turned down to 8600. He had intermittent low flows and he was given IV fluids. He has been instructed to continue to drink gatorade because this has been an ongoing problem. INR was followed closely. INR therapeutic on the day of discharge. PICC line was remove on the day of discharge.   He will continue to be followed closely in the VAD clinic. Plan to check INR, CBC, BMET next week.   LVAD Interrogation HM II:   Speed: 8600    Flow: 2.9      PI:  5.1     Power:   4      Discharge Weight: 175 pounds  Discharge Vitals: Blood pressure (!) 80/62, pulse 95, temperature (!) 97.5 F (36.4 C), temperature source Oral, resp. rate 17, height 6' 0.01" (1.829 m), weight 79.6 kg, SpO2 100 %.  Labs: Lab Results  Component Value Date   WBC 6.4 10/01/2018   HGB 9.0 (L) 10/01/2018   HCT 29.1 (L) 10/01/2018   MCV 97.3 10/01/2018   PLT 151 10/01/2018    Recent Labs  Lab 10/01/18 0239  NA 135  K 4.5  CL 107  CO2 22  BUN 23  CREATININE 1.66*  CALCIUM 8.0*  GLUCOSE 98   Lab Results  Component Value Date   CHOL 106 11/05/2015   HDL 33 (L) 11/05/2015   LDLCALC 60 11/05/2015   TRIG 67 11/05/2015   BNP (last 3 results) No  results for input(s): BNP in the last 8760 hours.  ProBNP (last 3 results) No results for input(s): PROBNP in the last 8760 hours.   Diagnostic Studies/Procedures   No results found.  Discharge Medications   Allergies as of 10/01/2018      Reactions   Demerol [meperidine] Other (See Comments)   Paralysis/ Could only move eyes      Medication List    TAKE these medications   amiodarone 200 MG tablet Commonly known as:  PACERONE Take 1 tablet (200 mg total) by mouth 2 (two) times daily.   atorvastatin 80 MG tablet Commonly known as:   LIPITOR Take 1 tablet (80 mg total) by mouth at bedtime.   docusate sodium 100 MG capsule Commonly known as:  COLACE Take 200 mg by mouth daily.   furosemide 20 MG tablet Commonly known as:  LASIX Take 20 mg by mouth daily as needed for fluid.   ICAPS PO Take 1 tablet by mouth 2 (two) times daily.   lactose free nutrition Liqd Take 237 mLs by mouth 3 (three) times daily between meals.   levothyroxine 150 MCG tablet Commonly known as:  SYNTHROID, LEVOTHROID Take 1 tablet (150 mcg total) by mouth daily before breakfast.   multivitamin with minerals Tabs tablet Take 1 tablet by mouth daily.   pantoprazole 40 MG tablet Commonly known as:  PROTONIX TAKE 1 TABLET TWICE DAILY What changed:  when to take this   promethazine 12.5 MG tablet Commonly known as:  PHENERGAN Take 12.5 mg by mouth every 6 (six) hours as needed for nausea or vomiting.   protein supplement shake Liqd Commonly known as:  PREMIER PROTEIN Take 11 oz by mouth 2 (two) times daily.   sotalol 80 MG tablet Commonly known as:  BETAPACE Take 1 tablet (80 mg total) by mouth 2 (two) times daily.   temazepam 30 MG capsule Commonly known as:  RESTORIL Take 1 capsule (30 mg total) by mouth at bedtime as needed for sleep.   warfarin 2 MG tablet Commonly known as:  COUMADIN Take as directed. If you are unsure how to take this medication, talk to your nurse or doctor. Original instructions:  Take 2 mg by mouth every evening.   zinc gluconate 50 MG tablet Take 50 mg by mouth 2 (two) times daily.       Disposition   The patient will be discharged in stable condition to home. Discharge Instructions    (HEART FAILURE PATIENTS) Call MD:  Anytime you have any of the following symptoms: 1) 3 pound weight gain in 24 hours or 5 pounds in 1 week 2) shortness of breath, with or without a dry hacking cough 3) swelling in the hands, feet or stomach 4) if you have to sleep on extra pillows at night in order to breathe.    Complete by:  As directed    Diet - low sodium heart healthy   Complete by:  As directed    Heart Failure patients record your daily weight using the same scale at the same time of day   Complete by:  As directed    Increase activity slowly   Complete by:  As directed    Page VAD Coordinator at [phone]  Notify for: any VAD alarms, sustained elevations of power >10 watts, sustained drop in Pulse Index <3   Complete by:  As directed    Notify for:   any VAD alarms sustained elevations of power >10 watts sustained drop in  Pulse Index <3     Speed Settings:   Complete by:  As directed    Fixed 8600RPM Low 8000 RPM     Follow-up Information    , Shaune Pascal, MD Follow up on 10/20/2018.   Specialty:  Cardiology Why:  at 2:00 Garage Code 1800  Contact information: [place] [address] [phone]             Duration of Discharge Encounter: Greater than 35 minutes   Signed, Darrick Grinder, NP  10/01/2018, 10:54 AM  Patient seen and examined with the above-signed Advanced Practice Provider and/or Housestaff. I personally reviewed laboratory data, imaging studies and relevant notes. I independently examined the patient and formulated the important aspects of the plan. I have edited the note to reflect any of my changes or salient points. I have personally discussed the plan with the patient and/or family.  He is stable for d/c. See my rounding note for further details. Will need close f/u in VAD clinic.   Glori Bickers, MD  2:29 PM

## 2018-09-22 NOTE — Progress Notes (Addendum)
Advanced Heart Failure VAD Team Note  PCP-Cardiologist: No primary care provider on file.   Subjective:    Admitted for resection of recurrent bladder tumor. Renal u/s obtained and shows recurrent R hydronephrosis without BOO. There is evidence of recurrent bladder tumor as well.  Ramp echo 11/14 speed turned down to 8600 Went to OR 11/15 for bladder tumor resection. Multiple large tumors. Unable to uncover R ureteral orifice.    Repeat renal u/s showed moderate R hydronephrosis (slightly improved) and new left mild hydro.   Yesterday low-dose heparin was started. INR 1.3. Urine starting to clear. Creatinine slightly improved to 1.9. Denies ab pain. No SOB.   Adamant he does not want an external nephrostomy tube    LVAD Interrogation HM 2: Speed: 8600 Flow: 3.6 PI: 6.1  Power: 3.9  VAD interrogated personally. 1 low flow.    Objective:    Vital Signs:   Temp:  [97.6 F (36.4 C)-98 F (36.7 C)] 97.6 F (36.4 C) (11/18 0350) Pulse Rate:  [68-109] 94 (11/18 0400) Resp:  [15-21] 20 (11/18 0400) BP: (65-124)/(48-99) 86/74 (11/18 0350) SpO2:  [90 %-100 %] 94 % (11/18 0400) Weight:  [78.7 kg] 78.7 kg (11/18 0350) Last BM Date: 09/18/18 Mean arterial Pressure 70-80s  Intake/Output:   Intake/Output Summary (Last 24 hours) at 09/22/2018 0712 Last data filed at 09/22/2018 0403 Gross per 24 hour  Intake 1037.1 ml  Output 2500 ml  Net -1462.9 ml     Physical Exam  Physical Exam: GENERAL: NAD. In Bed.  HEENT: normal anicteric NECK: Supple, JVP 5-6   2+ bilaterally, no bruits.  No lymphadenopathy or thyromegaly appreciated.   CARDIAC:  Mechanical heart sounds with LVAD hum present.  LUNGS:  Clear to auscultation bilaterally. On 2 liters oxygen.  No wheeze ABDOMEN:  Soft, round, nontender, positive bowel sounds x4.     LVAD exit site: well-healed and incorporated.  Dressing dry and intact.  No erythema or drainage.  Stabilization device present and accurately applied.   Driveline dressing is being changed daily per sterile technique. EXTREMITIES:  Warm and dry, no cyanosis, clubbing, rash or edema  Foley with blood-tinged urine Neuro: alert & oriented x 3, cranial nerves grossly intact. moves all 4 extremities w/o difficulty. Affect pleasan   Telemetry   A paced 90s personally reviewed.    Labs   Basic Metabolic Panel: Recent Labs  Lab 09/17/18 1343  09/18/18 1338 09/19/18 0418 09/20/18 0500 09/21/18 0637 09/22/18 0439  NA 134*   < > 134* 136 137 137 135  K 4.3   < > 4.3 4.7 4.8 4.9 5.0  CL 105   < > 106 106 107 107 104  CO2 22   < > 22 23 23 25 26   GLUCOSE 131*   < > 121* 100* 122* 94 100*  BUN 63*   < > 51* 54* 50* 44* 43*  CREATININE 2.16*   < > 2.19* 2.26* 2.20* 2.10* 1.97*  CALCIUM 8.8*   < > 8.8* 8.9 8.6* 8.9 8.6*  MG 2.1  --   --   --   --   --   --    < > = values in this interval not displayed.    Liver Function Tests: Recent Labs  Lab 09/17/18 1343  AST 36  ALT 33  ALKPHOS 172*  BILITOT 0.6  PROT 7.1  ALBUMIN 3.3*   No results for input(s): LIPASE, AMYLASE in the last 168 hours. No results for input(s): AMMONIA  in the last 168 hours.  CBC: Recent Labs  Lab 09/17/18 1343 09/18/18 0359 09/19/18 0418 09/20/18 0500 09/21/18 0637 09/22/18 0439  WBC 6.6 6.5 7.3 10.4 7.8 6.8  NEUTROABS 4.3  --   --   --   --   --   HGB 11.0* 10.6* 10.3* 9.8* 9.5* 9.5*  HCT 35.8* 33.6* 32.3* 31.6* 31.5* 31.4*  MCV 96.2 94.4 94.7 96.6 97.5 97.5  PLT 149* 130* 126* 124* 117* 128*    INR: Recent Labs  Lab 09/18/18 2106 09/19/18 0418 09/20/18 0500 09/21/18 0637 09/22/18 0439  INR 2.14 1.92 1.28 1.24 1.34    Other results:  EKG:    Imaging   US Renal  Result Date: 09/20/2018 CLINICAL DATA:  Recent bladder tumor debulking, evaluate for possible hydronephrosis EXAM: RENAL / URINARY TRACT ULTRASOUND COMPLETE COMPARISON:  09/18/2018 FINDINGS: Right Kidney: Renal measurements: 10.9 x 7.0 x 8.0 cm = volume: 319 mL. Moderate  hydronephrosis is noted. Large renal cyst is noted measuring 7.1 cm. Left Kidney: Renal measurements: 8.1 x 4.5 x 6.7 = volume: 128 mL. Mild left hydronephrosis is noted less than that seen on the right. Two cysts are noted on the left. The largest of these measures 3.4 cm. Bladder: Decompressed by Foley catheter. IMPRESSION: Bilateral hydronephrosis right greater than left the right-sided hydronephrosis is slightly improved from the prior exam. The left hydronephrosis is increased from the prior study. Stable renal cysts. Electronically Signed   By: Inez Catalina M.D.   On: 09/20/2018 13:24     Medications:     Scheduled Medications: . sodium chloride   Intravenous Once  . amiodarone  200 mg Oral BID  . atorvastatin  80 mg Oral QHS  . docusate sodium  200 mg Oral Daily  . lactose free nutrition  237 mL Oral BID BM  . levothyroxine  150 mcg Oral QAC breakfast  . multivitamin with minerals  1 tablet Oral Daily  . pantoprazole  40 mg Oral BID AC  . sodium chloride flush  10-40 mL Intracatheter Q12H  . sotalol  80 mg Oral BID  . zinc sulfate  220 mg Oral Daily    Infusions: . sodium chloride 10 mL/hr at 09/21/18 1830  . heparin 400 Units/hr (09/22/18 0403)    PRN Medications: acetaminophen, iothalamate meglumine, promethazine, sodium chloride flush, temazepam   Patient Profile   [name] is a 77 y.o. male with h/o VT, PAD and severe systolic HF (EF 61-44%) due to mixed ischemic/nonischemic CM. He is s/p HM II LVAD implant for destination therapy on October 19, 2013. VAD implantation was complicated by VT, syncope, AF/Aflutter and RHF. Required short term milrinone.   Admitted for heparin bridge for bladder tumor resection on 09/19/18.  Assessment/Plan:    1. 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  - Had 2 BCG infusions with Dr. Gloriann Loan but discontinued due to inability to tolerate - 7/12 s/p TURB with right  ureteral stent placement.  - Renal u/s 11/14 with recurrent R hydronephrosis without BOO. There is evidence of recurrent bladder tumor as well - Underwent recurrent bladder tumor resection 09/19/18 with Dr Gloriann Loan. Found to be large fast-growing tumor with obstruction of right ureteral orifice - Repeat renal u/s 11/16 shows moderate R hydronephrosis (slightly improved) and new left mild hydro.  - Continue Foley per Urology.  - Creatinine stable.  - On heparin. INR 1.34  - Further plans per Urology -  appreciate their help 2. Chronic systolic HF s/p HM-II LVAD 10/2014  - RHC in 1/19 with mildly low output. Started on milrinone without benefit.  - Volume status stable - Ramp echo 11/14 speed turned down to 8600. Moderate RV HK. VAD interrogated personally. Parameters stable. (LVAD speed reduced to 8800 in 10/17 due to RV failure) - s/p previous driveline repair. No pump stops - Continue heparin. INR 1.34.  - VAD interrogated personally. One low flow alarm.  3. 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 Required HD x1 - Creatininebaseline 1.4-1.5 - CR 2.16 -> 2.18 -> 2.26 -> 2.20 -> 2.10->1.97   in setting of R> L hydronephrosis - Bladder tumor debulked but unable to find R ureteral orifice. Discussed possibility of perc tube with Urology but no indication currently.  -Mr. Novell says he would not want externalized drain 4 h/o.RecurrentVTwith hypotension and low flow alarms on VAD: Underwent DC-CV 9/17, 08/17/17, and 04/01/18. S/p emergent DC-CV in ER 07/09/18  - Had several low flow alarms on 11/9, 11/4, and 10/31. ICD shows several runs of AT 10/24-10/28, all lasting about an hour. Nothing that correlates with low flow alarms.  -Quiescent VT onSotalol80mg BIDand amio - QTC slightly prolonged but stable. Previously reviewed with Dr. Caryl Comes and felt OK.  - Keep K > 4.0 Mg > 2.0 5. Atrial arrhythmias: Atrial fibrillation, atrial tachycardia. S/p DC-CV  06/04/14, DC-CV 03/02/15 and 01/19/16.  -Remains in NSR. Device interrogation this admit shows multiple brief runs atrial tach 6. H/o Recurrent GIB - Most recent bleed 12/18. hgb 6.5, Transfused 5u. EGD with 2 AVMs treated with APC - Denies GI bleeding.  - Hgb stable at 9.5  - Has been getting octreotide infusions but too expensive for him. Holding for now.   OOB and ambulate today. I reviewed the LVAD parameters from today, and compared the results to the patient's prior recorded data.  No programming changes were made.  The LVAD is functioning within specified parameters.  The patient performs LVAD self-test daily.  LVAD interrogation was negative for any significant power changes, alarms or PI events/speed drops.  LVAD equipment check completed and is in good working order.  Back-up equipment present.   LVAD education done on emergency procedures and precautions and reviewed exit site care.   Length of Stay: 5  Amy Clegg, NP 09/22/2018, 7:12 AM  VAD Team --- VAD ISSUES ONLY--- Pager [phone] (7am - 7am)  Advanced Heart Failure Team  Pager [phone] (M-F; 7a - 4p)  Please contact [facility] for night-coverage after hours (4p -7a ) and weekends on [email or web address]   Patient seen and examined with the above-signed Advanced Practice Provider and/or Housestaff. I personally reviewed laboratory data, imaging studies and relevant notes. I independently examined the patient and formulated the important aspects of the plan. I have edited the note to reflect any of my changes or salient points. I have personally discussed the plan with the patient and/or family.  On low-dose heparin. Hematuria starting to clear. Will continue. Will try to increase heparin rate to 600u/hr. Will d/w Urology regarding next steps. If no further intervention planned will start coumadin. Has bilateral hydronephrosis on renal u/s. Creatinine improving fortunately. Will follow. HF stable. VAD interrogated personally.  Parameters stable.  Glori Bickers, MD  8:37 AM

## 2018-09-23 DIAGNOSIS — I471 Supraventricular tachycardia: Secondary | ICD-10-CM | POA: Diagnosis not present

## 2018-09-23 DIAGNOSIS — C670 Malignant neoplasm of trigone of bladder: Secondary | ICD-10-CM | POA: Diagnosis not present

## 2018-09-23 DIAGNOSIS — I5022 Chronic systolic (congestive) heart failure: Secondary | ICD-10-CM | POA: Diagnosis not present

## 2018-09-23 DIAGNOSIS — Z95811 Presence of heart assist device: Secondary | ICD-10-CM | POA: Diagnosis not present

## 2018-09-23 DIAGNOSIS — C679 Malignant neoplasm of bladder, unspecified: Secondary | ICD-10-CM | POA: Diagnosis not present

## 2018-09-23 DIAGNOSIS — C672 Malignant neoplasm of lateral wall of bladder: Secondary | ICD-10-CM | POA: Diagnosis not present

## 2018-09-23 LAB — BASIC METABOLIC PANEL
ANION GAP: 5 (ref 5–15)
BUN: 42 mg/dL — AB (ref 8–23)
CO2: 25 mmol/L (ref 22–32)
Calcium: 8.6 mg/dL — ABNORMAL LOW (ref 8.9–10.3)
Chloride: 106 mmol/L (ref 98–111)
Creatinine, Ser: 2.03 mg/dL — ABNORMAL HIGH (ref 0.61–1.24)
GFR, EST AFRICAN AMERICAN: 34 mL/min — AB (ref >60)
GFR, EST NON AFRICAN AMERICAN: 30 mL/min — AB (ref >60)
Glucose, Bld: 101 mg/dL — ABNORMAL HIGH (ref 70–99)
POTASSIUM: 4.8 mmol/L (ref 3.5–5.1)
SODIUM: 136 mmol/L (ref 135–145)

## 2018-09-23 LAB — HEPARIN LEVEL (UNFRACTIONATED): Heparin Unfractionated: <0.1 IU/mL — ABNORMAL LOW (ref 0.30–0.70)

## 2018-09-23 LAB — CBC
HCT: 30.6 % — ABNORMAL LOW (ref 39.0–52.0)
HEMOGLOBIN: 9.5 g/dL — AB (ref 13.0–17.0)
MCH: 30.3 pg (ref 26.0–34.0)
MCHC: 31 g/dL (ref 30.0–36.0)
MCV: 97.5 fL (ref 80.0–100.0)
Platelets: 128 10*3/uL — ABNORMAL LOW (ref 150–400)
RBC: 3.14 MIL/uL — AB (ref 4.22–5.81)
RDW: 16.7 % — ABNORMAL HIGH (ref 11.5–15.5)
WBC: 7 10*3/uL (ref 4.0–10.5)
nRBC: 0 % (ref 0.0–0.2)

## 2018-09-23 LAB — PROTIME-INR
INR: 1.35
Prothrombin Time: 16.6 seconds — ABNORMAL HIGH (ref 11.4–15.2)

## 2018-09-23 LAB — LACTATE DEHYDROGENASE: LDH: 193 U/L — ABNORMAL HIGH (ref 98–192)

## 2018-09-23 MED ORDER — WARFARIN - PHARMACIST DOSING INPATIENT
Freq: Every day | Status: DC
  Administered 2018-09-25 – 2018-09-29 (×4): 1

## 2018-09-23 MED ORDER — WARFARIN SODIUM 2 MG PO TABS
2.0000 mg | ORAL_TABLET | Freq: Once | ORAL | Status: AC
  Administered 2018-09-23: 2 mg via ORAL
  Filled 2018-09-23: qty 1

## 2018-09-23 NOTE — Plan of Care (Signed)
Goal: Cardiovascular complication will be avoided Outcome: Progressing   Problem: Activity: Goal: Risk for activity intolerance will decrease Outcome: Progressing   Problem: Coping: Goal: Level of anxiety will decrease Outcome: Progressing   Problem: Elimination: Goal: Will not experience complications related to urinary retention Outcome: Progressing   Problem: Pain Managment: Goal: General experience of comfort will improve Outcome: Progressing  Problem: Cardiac: Goal: LVAD will function as expected and patient will experience no clinical alarms Outcome: Progressing

## 2018-09-23 NOTE — Progress Notes (Signed)
Advanced Heart Failure VAD Team Note  PCP-Cardiologist: No primary care provider on file.   Subjective:    Admitted for resection of recurrent bladder tumor. Renal u/s obtained and shows recurrent R hydronephrosis without BOO. There is evidence of recurrent bladder tumor as well.  Ramp echo 11/14 speed turned down to 8600 Went to OR 11/15 for bladder tumor resection. Multiple large tumors. Unable to uncover R ureteral orifice.    Repeat renal u/s showed moderate R hydronephrosis (slightly improved) and new left mild hydro.   Remains on heparin. Now at 600u/hr. Also got coumadin. Hematuria is clearing. No pain. CT abdomen yesterday with no metastatic disease. bilateral hydronephrosis L>R  Resting comfortably. No SOB, orthopnea. No fevers or chills. Adamant he does not want an external nephrostomy tube    LVAD Interrogation HM 2: Speed: 8600 Flow: 3.2 PI: 3.9 Power: 4.0 VAD interrogated personally.    Objective:    Vital Signs:   Temp:  [97.6 F (36.4 C)-98.3 F (36.8 C)] 98 F (36.7 C) (11/19 0313) Pulse Rate:  [74-88] 74 (11/19 0313) Resp:  [16-24] 17 (11/19 0313) BP: (74-93)/(59-78) 80/67 (11/19 0313) SpO2:  [95 %-100 %] 99 % (11/19 0313) Weight:  [78.3 kg] 78.3 kg (11/19 0313) Last BM Date: 09/18/18 Mean arterial Pressure 70-80s  Intake/Output:   Intake/Output Summary (Last 24 hours) at 09/23/2018 0631 Last data filed at 09/23/2018 0300 Gross per 24 hour  Intake 1282.35 ml  Output 950 ml  Net 332.35 ml     Physical Exam  Physical Exam: General:  NAD.  HEENT: normal  Neck: supple. JVP not elevated.  Carotids 2+ bilat; no bruits. No lymphadenopathy or thryomegaly appreciated. Cor: LVAD hum.  Lungs: Clear. Abdomen: soft, nontender, non-distended. No hepatosplenomegaly. No bruits or masses. Good bowel sounds. Driveline site clean. Anchor in place.  Extremities: no cyanosis, clubbing, rash. Warm no edema  Foley with light blood-tinged urine Neuro: alert &  oriented x 3. No focal deficits. Moves all 4 without problem    Telemetry   A paced 90s personally reviewed.    Labs   Basic Metabolic Panel: Recent Labs  Lab 09/17/18 1343  09/19/18 0418 09/20/18 0500 09/21/18 0637 09/22/18 0439 09/23/18 0306  NA 134*   < > 136 137 137 135 136  K 4.3   < > 4.7 4.8 4.9 5.0 4.8  CL 105   < > 106 107 107 104 106  CO2 22   < > 23 23 25 26 25   GLUCOSE 131*   < > 100* 122* 94 100* 101*  BUN 63*   < > 54* 50* 44* 43* 42*  CREATININE 2.16*   < > 2.26* 2.20* 2.10* 1.97* 2.03*  CALCIUM 8.8*   < > 8.9 8.6* 8.9 8.6* 8.6*  MG 2.1  --   --   --   --   --   --    < > = values in this interval not displayed.    Liver Function Tests: Recent Labs  Lab 09/17/18 1343  AST 36  ALT 33  ALKPHOS 172*  BILITOT 0.6  PROT 7.1  ALBUMIN 3.3*   No results for input(s): LIPASE, AMYLASE in the last 168 hours. No results for input(s): AMMONIA in the last 168 hours.  CBC: Recent Labs  Lab 09/17/18 1343  09/19/18 0418 09/20/18 0500 09/21/18 0637 09/22/18 0439 09/23/18 0306  WBC 6.6   < > 7.3 10.4 7.8 6.8 7.0  NEUTROABS 4.3  --   --   --   --   --   --  HGB 11.0*   < > 10.3* 9.8* 9.5* 9.5* 9.5*  HCT 35.8*   < > 32.3* 31.6* 31.5* 31.4* 30.6*  MCV 96.2   < > 94.7 96.6 97.5 97.5 97.5  PLT 149*   < > 126* 124* 117* 128* 128*   < > = values in this interval not displayed.    INR: Recent Labs  Lab 09/19/18 0418 09/20/18 0500 09/21/18 0160 09/22/18 0439 09/23/18 0306  INR 1.92 1.28 1.24 1.34 1.35    Other results:  EKG:    Imaging   Ct Abdomen Pelvis Wo Contrast  Result Date: 09/22/2018 CLINICAL DATA:  77 year old male or bladder cancer staging. EXAM: CT ABDOMEN AND PELVIS WITHOUT CONTRAST TECHNIQUE: Multidetector CT imaging of the abdomen and pelvis was performed following the standard protocol without IV contrast, which could not be administered due to renal insufficiency. COMPARISON:  12/14/2017 and prior CTs FINDINGS: Please note that  parenchymal abnormalities may be missed without intravenous contrast. Lower chest: Cardiomegaly, cardiac surgical changes, pacemaker lead and LEFT ventricular assist device again identified. Emphysema again noted. Hepatobiliary: The liver is unremarkable. Patient is status post cholecystectomy. No biliary dilatation. Pancreas: Unremarkable Spleen: Unremarkable Adrenals/Urinary Tract: New moderate to severe RIGHT hydroureteronephrosis to the bladder is identified. Mild to moderate LEFT hydroureteronephrosis to the bladder is unchanged. Foley catheter within the bladder is again identified. Low-density renal lesions/cysts are unchanged. The adrenal glands are unremarkable. Stomach/Bowel: Stomach is within normal limits. No evidence of bowel wall thickening, distention, or inflammatory changes. Vascular/Lymphatic: Aortoiliac repair/grafts noted. Aortic atherosclerotic calcifications noted. No enlarged lymph nodes. Reproductive: No definite prostate abnormality. Other: Presacral/precoccygeal stranding/edema again noted. No ascites, focal collection or pneumoperitoneum. Musculoskeletal: No acute or suspicious bony abnormalities. L4 compression fracture and LOWER lumbar fusion hardware again noted. IMPRESSION: 1. New moderate to severe RIGHT hydroureteronephrosis to the bladder without obstructing cause identified on this study. 2. Unchanged mild to moderate LEFT hydroureteronephrosis to the bladder. Foley catheter within the bladder again identified. 3. No suspicious bony abnormalities or evidence of metastatic disease on this study. 4. Cardiomegaly and LEFT ventricular assist device 5. Aortic Atherosclerosis (ICD10-I70.0) and Emphysema (ICD10-J43.9). Electronically Signed   By: Margarette Canada M.D.   On: 09/22/2018 13:00     Medications:     Scheduled Medications: . sodium chloride   Intravenous Once  . amiodarone  200 mg Oral BID  . atorvastatin  80 mg Oral QHS  . docusate sodium  200 mg Oral Daily  . lactose  free nutrition  237 mL Oral BID BM  . levothyroxine  150 mcg Oral QAC breakfast  . multivitamin with minerals  1 tablet Oral Daily  . pantoprazole  40 mg Oral BID AC  . sodium chloride flush  10-40 mL Intracatheter Q12H  . sotalol  80 mg Oral BID  . zinc sulfate  220 mg Oral Daily    Infusions: . sodium chloride 10 mL/hr at 09/21/18 1830  . heparin 600 Units/hr (09/22/18 1900)    PRN Medications: acetaminophen, iothalamate meglumine, promethazine, sodium chloride flush, temazepam   Patient Profile   [name] is a 77 y.o. male with h/o VT, PAD and severe systolic HF (EF 10-93%) due to mixed ischemic/nonischemic CM. He is s/p HM II LVAD implant for destination therapy on October 19, 2013. VAD implantation was complicated by VT, syncope, AF/Aflutter and RHF. Required short term milrinone.   Admitted for heparin bridge for bladder tumor resection on 09/19/18.  Assessment/Plan:    1. 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  - Had 2 BCG infusions with Dr. Gloriann Loan but discontinued due to inability to tolerate - 7/12 s/p TURB with right ureteral stent placement.  - Renal u/s 11/14 with recurrent R hydronephrosis without BOO. There is evidence of recurrent bladder tumor as well - Underwent recurrent bladder tumor resection 09/19/18 with Dr Gloriann Loan. Found to be large fast-growing tumor with obstruction of right ureteral orifice - Repeat renal u/s 11/16 shows moderate R hydronephrosis (slightly improved) and new left mild hydro.  - CT ab/pelvis 11/18 no metastatic or locally invasive disease. Bilateral hydronephrosis L>R - Continue Foley per Urology.  - Creatinine up slightly, Patient doesn't want external perc tube but may consider perc with stenting if needed  - On heparin at 600u/hr. Coumadin started 11/18  Will continue INR 1.34-> 1.35 - Further plans per Urology - appreciate their help 2. Chronic systolic HF s/p  HM-II LVAD 10/2014  - RHC in 1/19 with mildly low output. Started on milrinone without benefit.  - Volume status stable - Ramp echo 11/14 speed turned down to 8600. Moderate RV HK. VAD interrogated personally. Parameters stable. (LVAD speed reduced to 8800 in 10/17 due to RV failure) - s/p previous driveline repair. No pump stops - Continue heparin. INR 1.35 - VAD interrogated personally. Parameters stable. - LDH 193 3. 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 Required HD x1 - Creatininebaseline 1.4-1.5 - CR 2.16 -> 2.18 -> 2.26 -> 2.20 -> 2.10->1.97-> 2.03   in setting of R> L hydronephrosis - Bladder tumor debulked but unable to find R ureteral orifice. Discussed possibility of perc tube with Urology but no indication currently.  - Mr. Abdou says he would not want externalized drain. See discussion above 4 h/o.RecurrentVTwith hypotension and low flow alarms on VAD: Underwent DC-CV 9/17, 08/17/17, and 04/01/18. S/p emergent DC-CV in ER 07/09/18  - Had several low flow alarms on 11/9, 11/4, and 10/31. ICD shows several runs of AT 10/24-10/28, all lasting about an hour. Nothing that correlates with low flow alarms.  -Quiescent VT onSotalol80mg BIDand amio - QTC slightly prolonged but stable. Previously reviewed with Dr. Caryl Comes and felt OK.  - Keep K > 4.0 Mg > 2.0 5. Atrial arrhythmias: Atrial fibrillation, atrial tachycardia. S/p DC-CV 06/04/14, DC-CV 03/02/15 and 01/19/16.  -Remains in NSR. Device interrogation this admit shows multiple brief runs atrial tach 6. H/o Recurrent GIB - Most recent bleed 12/18. hgb 6.5, Transfused 5u. EGD with 2 AVMs treated with APC - Denies GI bleeding.  - Hgb stable at 9.5 - Has been getting octreotide infusions but too expensive for him. Holding for now.   OOB and ambulate today. I reviewed the LVAD parameters from today, and compared the results to the patient's prior recorded data.  No programming changes were  made.  The LVAD is functioning within specified parameters.  The patient performs LVAD self-test daily.  LVAD interrogation was negative for any significant power changes, alarms or PI events/speed drops.  LVAD equipment check completed and is in good working order.  Back-up equipment present.   LVAD education done on emergency procedures and precautions and reviewed exit site care.   Length of Stay: [place], MD 09/23/2018, 6:31 AM  VAD Team --- VAD ISSUES ONLY--- Pager [phone] (7am - 7am)  Advanced Heart Failure Team  Pager [phone] (M-F; 7a - 4p)  Please contact [facility] for night-coverage  after hours (4p -7a ) and weekends on [email or web address]

## 2018-09-23 NOTE — Progress Notes (Signed)
ANTICOAGULATION CONSULT NOTE - Follow Up Consult  Pharmacy Consult for warfarin / heparin Indication: LVAD  Allergies  Allergen Reactions  . Demerol [Meperidine] Other (See Comments)    Paralysis/ Could only move eyes    Patient Measurements: Height: 6' 0.01" (182.9 cm) Weight: 172 lb 9.9 oz (78.3 kg) IBW/kg (Calculated) : 77.62 Heparin Dosing Weight: 77kg  Vital Signs: Temp: 97.9 F (36.6 C) (11/19 1303) Temp Source: Oral (11/19 1303) BP: 91/79 (11/19 1303) Pulse Rate: 74 (11/19 0313)  Labs: Recent Labs    09/21/18 7209 09/22/18 0439 09/22/18 0440 09/23/18 0305 09/23/18 0306  HGB 9.5* 9.5*  --   --  9.5*  HCT 31.5* 31.4*  --   --  30.6*  PLT 117* 128*  --   --  128*  LABPROT 15.5* 16.5*  --   --  16.6*  INR 1.24 1.34  --   --  1.35  HEPARINUNFRC  --   --  <0.10* <0.10*  --   CREATININE 2.10* 1.97*  --   --  2.03*    Estimated Creatinine Clearance: 32.9 mL/min (A) (by C-G formula based on SCr of 2.03 mg/dL (H)).   Medical History: Past Medical History:  Diagnosis Date  . Arthritis   . Automatic implantable cardioverter-defibrillator in situ    a. s/p [employer] Evera device serial [id number]    . Bradycardia   . CAD (coronary artery disease)    a. s/p MI 1982;  s/p DES to CFX 2011;  s/p NSTEMI 4/12 in setting of VTach;  cath 7/12:  LM ok, LAD mid 30-40%, Dx's with 40%, mCFX stent patent, prox to mid RCA occluded with R to R and L to R collats.  Medical Tx was continued  . Chronic systolic heart failure ([place])    a. s/p LVAD 10/2013 for destination therapy  . CKD (chronic kidney disease)   . CVD (cerebrovascular disease)    a. s/p L CEA 2008  . Cytopenia   . Dysrhythmia    AFIB  . History of GI bleed    a. on ASA- started on plavix during 10/2015 admission for CVA  . HLD (hyperlipidemia)   . HTN (hypertension)   . Hypothyroidism   . Inappropriate therapy from implantable cardioverter-defibrillator    a. ATP for sinus tach SVT wavelet identified SVT but  was no  passive (2014)  . Ischemic cardiomyopathy   . 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 L Ear   . Myocardial infarction ([place])    1992  . PAF (paroxysmal atrial fibrillation) ([place])   . PVD (peripheral vascular disease) ([place])    a. 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 Shortness of breath dyspnea    W/ EXERTION   . Sleep apnea    NO CPAP NOW  HE SENT IT BACK YRS AGO  . Stroke [facility])    a. 10/2015 admission   . Ventricular tachycardia ([place])    a. s/p AICD;   h/o ICD shock;  Amiodarone Rx. S/P ablation in 2012   Assessment: 77 year old male with history of LVAD implant being admitted for hold of warfarin and heparin bridge in anticipation of bladder tumor resection on 11/15. PTA warfarin dose is 2 mg daily. Pt did receive vitamin K 2.5 mg po x1 on 11/14 and + 2 units of FFP on  11/15 + increased home Boost TID- which has some vitamin K.   INR essentially unchanged at 1.35 today. Continues to have some hematuria, but this is clearing. Hemoglobin is stable. Will restart warfarin and per Dr. Haroldine Laws will continue heparin at 600 units/hr with no dose titration to HL.   Goal of Therapy:  INR 2.0-2.5 Heparin level 0.3-0.5 units/ml Monitor platelets by anticoagulation protocol: Yes   Plan:  Heparin 600 units/hr - no titration Warfarin 2 mg PO x1 tonight Check HL, INR with AM labs Monitor for signs/symptoms of bleeding  Jackson Latino, PharmD PGY1 Pharmacy Resident Phone [phone] 09/23/2018     2:30 PM

## 2018-09-23 NOTE — Progress Notes (Signed)
LVAD Coordinator Rounding Note:  Admitted on 09/17/18 for heparin bridge for bladder tumor resection scheduled on Friday 11/15.   HM II Implanted 10/19/13 by Dr. Cyndia Bent under Destination Therapy criteria.  Pt asleep in bed this morning. He woke up when I entered. Pt is discouraged from the news of urology regarding his bladder CA. Pt states that he is meeting with Dr. Haroldine Laws and his Cook at 4p to discuss goals of care.  Vital signs: Temp: 97.8 HR: 81 Doppler Pressure:66 Automatic BP:82/72 (78) O2 Sat: 96% on RA  Weight: 77.3>76.4>76.2>78.7>78.3kg  LVAD interrogation reveals:  Speed: 8600 Flow:3.6 Power: 4.1w PI: 5.7  Alarms: no alarms overnight 11/17: 1 LF 11/9: 36 Low flows  11/8: 3 LF  11/7 1 LF  11/4 14 LF  10/31- 4 LF   Events:3 PI events overnight  Fixed speed: 8600 Low speed limit: 8000  Drive Line: Weekly dressings performed by bedside nurse or SO [name]. Next dressing change due 09/29/18.  Labs:  LDH trend: 261>219>208>199>193  INR trend:  2.5>2.18>1.92>1.34>1.35  Creat trend: 1.55>2.18>2.26>1.97>2.03  Anticoagulation Plan: - INR Goal: 2.0 - 2.5 - ASA Dose: no ASA (removed 02/2017 due to GI bleed) - Heparin gtt per pharmacy  Device: -Dual Medtronic ICD -Therapies: on at 200 bpm - Last device check 10/02/17  Significant Events on VAD Support:  09/01/14>>ureteral stone with cystoscopy and flexible ureteroscopy with lithotripsy and basket extraction 02/2015 >>GIB with 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 Started on Plavix 3/17>>Lumbar fusion L3-L5 9/17>>VT with DC-CV; amio increased 10/17>>ramp with RV failure; speed decreased to 8800 02/2017> GIB (removed ASA, scopes negative) 03/2017>percutaneous lead repair after pump stops  10/18>>Slow VT. Cardioverted in ED and amiodarone  uptitrated 12/18>>melena with scope. Hgb 6.5. EGD with 2 AVMs with APC 12/18>>recurrent hematuria. 6 mm left ureteral stone 1/19>>7 mm left ureteral stone removal with laser lithotripsy; left ureteral stent. Resection of 6 cm bladder tumor    Plan/Recommendations:  1. Please call VAD pager if any issues with VAD equipment or questions, concerns. 2. Our social worker will see pt today to discuss bladder CA and goals of care with pt.    Tanda Rockers RN [place] Coordinator  Office: [phone]  24/7 Pager: [phone]

## 2018-09-23 NOTE — Care Management Note (Signed)
Case Management Note  Patient Details  Name: [name] MRN: [id number] Date of Birth: [birth year]  Subjective/Objective:   Pt admitted with Bladder CA - pt is now s/p TURP procedure.  Pt has LVAD                 Action/Plan:  PTA independent from home with wife.     Expected Discharge Date:  09/24/18               Expected Discharge Plan:     In-House Referral:  Clinical Social Work  Discharge planning Services  CM Consult  Post Acute Care Choice:    Choice offered to:     DME Arranged:    DME Agency:     HH Arranged:    HH Agency:     Status of Service:  In process, will continue to follow  If discussed at Long Length of Stay Meetings, dates discussed:    Additional Comments: 09/23/2018 Pt has extensive CA and currently refusing nephrostomy tube.  Palliative care scheduled to discuss [place] with pt/family 11/19.  CM will continue to follow [name], RN 09/23/2018, 3:24 PM

## 2018-09-23 NOTE — Progress Notes (Addendum)
Per Dr. Gloriann Loan, foley catheter to be d/c today. MD paged, awaiting orders.  Received verbal order to remove foley.

## 2018-09-23 NOTE — Care Management (Deleted)
durable medical equipment

## 2018-09-23 NOTE — Progress Notes (Signed)
Urology Inpatient Progress Report  LVAD BLADDER CANCER  Procedure(s): TRANSURETHRAL RESECTION OF BLADDER TUMOR left  RETROGRADE PYELOGRAM CYSTOGRAM  4 Days Post-Op   Intv/Subj: No acute events overnight. Patient is without complaint. Urine continues to clear.  Hemoglobin stable.  Creatinine stable.  CT revealed no evidence of lymphadenopathy.  Principal Problem:   Bladder cancer [facility]) Active Problems:   Atrial tachycardia (HCC)   LVAD (left ventricular assist device) present ([place])   AKI (acute kidney injury) ([place])   Hydronephrosis of right kidney  Current Facility-Administered Medications  Medication Dose Route Frequency Provider Last Rate Last Dose  . 0.9 %  sodium chloride infusion (Manually program via Guardrails IV Fluids)   Intravenous Once Bensimhon, Shaune Pascal, MD      . 0.9 %  sodium chloride infusion   Intravenous Continuous Oleta Mouse, MD 10 mL/hr at 09/21/18 1830    . acetaminophen (TYLENOL) tablet 650 mg  650 mg Oral Q4H PRN Georgiana Shore, NP      . amiodarone (PACERONE) tablet 200 mg  200 mg Oral BID Georgiana Shore, NP   200 mg at 09/23/18 1028  . atorvastatin (LIPITOR) tablet 80 mg  80 mg Oral QHS Georgiana Shore, NP   80 mg at 09/22/18 2107  . docusate sodium (COLACE) capsule 200 mg  200 mg Oral Daily Georgiana Shore, NP   200 mg at 09/23/18 1028  . heparin ADULT infusion 100 units/mL (25000 units/233mL sodium chloride 0.45%)  600 Units/hr Intravenous Continuous Bensimhon, Shaune Pascal, MD 6 mL/hr at 09/23/18 0754 600 Units/hr at 09/23/18 0754  . iothalamate meglumine (CYSTO-CONRAY II) 17.2 % solution 250 mL  250 mL Intravesical Once PRN Marton Redwood III, MD      . lactose free nutrition (BOOST PLUS) liquid 237 mL  237 mL Oral BID BM Bensimhon, Shaune Pascal, MD   237 mL at 09/23/18 1027  . levothyroxine (SYNTHROID, LEVOTHROID) tablet 150 mcg  150 mcg Oral QAC breakfast Georgiana Shore, NP   150 mcg at 09/23/18 0700  . multivitamin with minerals tablet 1 tablet   1 tablet Oral Daily Georgiana Shore, NP   1 tablet at 09/23/18 1028  . pantoprazole (PROTONIX) EC tablet 40 mg  40 mg Oral BID AC Georgiana Shore, NP   40 mg at 09/23/18 0700  . promethazine (PHENERGAN) tablet 12.5 mg  12.5 mg Oral Q6H PRN Georgiana Shore, NP      . sodium chloride flush (NS) 0.9 % injection 10-40 mL  10-40 mL Intracatheter Q12H Bensimhon, Shaune Pascal, MD   10 mL at 09/23/18 1028  . sodium chloride flush (NS) 0.9 % injection 10-40 mL  10-40 mL Intracatheter PRN Bensimhon, Shaune Pascal, MD      . sotalol (BETAPACE) tablet 80 mg  80 mg Oral BID Georgiana Shore, NP   80 mg at 09/23/18 1028  . temazepam (RESTORIL) capsule 30 mg  30 mg Oral QHS PRN Georgiana Shore, NP   30 mg at 09/22/18 2108  . warfarin (COUMADIN) tablet 2 mg  2 mg Oral ONCE-1800 Lore, Melissa A, RPH      . Warfarin - Pharmacist Dosing Inpatient   Does not apply q1800 Wilburn Cornelia, RPH      . zinc sulfate capsule 220 mg  220 mg Oral Daily Bensimhon, Shaune Pascal, MD   220 mg at 09/23/18 1028     Objective: Vital: Vitals:   09/23/18 0754 09/23/18 1303 09/23/18 1525 09/23/18  1630  BP: (!) 82/72 91/79 90/76  (!) 85/71  Pulse:      Resp: 15 14 17 18   Temp: 97.8 F (36.6 C) 97.9 F (36.6 C) 97.9 F (36.6 C)   TempSrc: Oral Oral Oral   SpO2: 97% 97% 97% 98%  Weight:      Height:       I/Os: I/O last 3 completed shifts: In: 1519.5 [P.O.:1040; I.V.:479.5] Out: 2400 [Urine:2400]  Physical Exam:  General: Patient is in no apparent distress Lungs: Normal respiratory effort, chest expands symmetrically. GI: The abdomen is soft and nontender without mass. Foley: Light red urine, clearing Ext: lower extremities symmetric  Lab Results: Recent Labs    09/21/18 0637 09/22/18 0439 09/23/18 0306  WBC 7.8 6.8 7.0  HGB 9.5* 9.5* 9.5*  HCT 31.5* 31.4* 30.6*   Recent Labs    09/21/18 0637 09/22/18 0439 09/23/18 0306  NA 137 135 136  K 4.9 5.0 4.8  CL 107 104 106  CO2 25 26 25   GLUCOSE 94 100* 101*  BUN 44*  43* 42*  CREATININE 2.10* 1.97* 2.03*  CALCIUM 8.9 8.6* 8.6*   Recent Labs    09/21/18 0637 09/22/18 0439 09/23/18 0306  INR 1.24 1.34 1.35   No results for input(s): LABURIN in the last 72 hours. Results for orders placed or performed during the hospital encounter of 09/17/18  MRSA PCR Screening     Status: None   Collection Time: 09/17/18  1:36 PM  Result Value Ref Range Status   MRSA by PCR NEGATIVE NEGATIVE Final    Comment:        The GeneXpert MRSA Assay (FDA approved for NASAL specimens only), is one component of a comprehensive MRSA colonization surveillance program. It is not intended to diagnose MRSA infection nor to guide or monitor treatment for MRSA infections. Performed at [facility], [place] [address] [place], [place] [address]    *Note: Due to a large number of results and/or encounters for the requested time period, some results have not been displayed. A complete set of results can be found in Results Review.    Studies/Results: Ct Abdomen Pelvis Wo Contrast  Result Date: 09/22/2018 CLINICAL DATA:  77 year old male or bladder cancer staging. EXAM: CT ABDOMEN AND PELVIS WITHOUT CONTRAST TECHNIQUE: Multidetector CT imaging of the abdomen and pelvis was performed following the standard protocol without IV contrast, which could not be administered due to renal insufficiency. COMPARISON:  12/14/2017 and prior CTs FINDINGS: Please note that parenchymal abnormalities may be missed without intravenous contrast. Lower chest: Cardiomegaly, cardiac surgical changes, pacemaker lead and LEFT ventricular assist device again identified. Emphysema again noted. Hepatobiliary: The liver is unremarkable. Patient is status post cholecystectomy. No biliary dilatation. Pancreas: Unremarkable Spleen: Unremarkable Adrenals/Urinary Tract: New moderate to severe RIGHT hydroureteronephrosis to the bladder is identified. Mild to moderate LEFT hydroureteronephrosis to the bladder is  unchanged. Foley catheter within the bladder is again identified. Low-density renal lesions/cysts are unchanged. The adrenal glands are unremarkable. Stomach/Bowel: Stomach is within normal limits. No evidence of bowel wall thickening, distention, or inflammatory changes. Vascular/Lymphatic: Aortoiliac repair/grafts noted. Aortic atherosclerotic calcifications noted. No enlarged lymph nodes. Reproductive: No definite prostate abnormality. Other: Presacral/precoccygeal stranding/edema again noted. No ascites, focal collection or pneumoperitoneum. Musculoskeletal: No acute or suspicious bony abnormalities. L4 compression fracture and LOWER lumbar fusion hardware again noted. IMPRESSION: 1. New moderate to severe RIGHT hydroureteronephrosis to the bladder without obstructing cause identified on this study. 2. Unchanged mild to moderate LEFT  hydroureteronephrosis to the bladder. Foley catheter within the bladder again identified. 3. No suspicious bony abnormalities or evidence of metastatic disease on this study. 4. Cardiomegaly and LEFT ventricular assist device 5. Aortic Atherosclerosis (ICD10-I70.0) and Emphysema (ICD10-J43.9). Electronically Signed   By: Margarette Canada M.D.   On: 09/22/2018 13:00   CT scan personally reviewed.  Right-sided hydronephrosis.  Minimal left-sided hydronephrosis.  Foley catheter within the bladder.  No lymphadenopathy.  Pathology personally reviewed.  This revealed muscle invasive bladder cancer, T2  Assessment: Muscle invasive bladder cancer Gross hematuria bilateral hydronephrosis  Procedure(s): TRANSURETHRAL RESECTION OF BLADDER TUMOR left  RETROGRADE PYELOGRAM CYSTOGRAM, 4 Days Post-Op  doing well.  Plan: I discussed the pathology with the patient.  Unfortunately this revealed muscle invasive bladder cancer.  The typical treatment for this would be radical cystectomy with or without neoadjuvant chemotherapy.  He however is not able to tolerate this given his multiple  medical comorbidities.  He may be a candidate for palliative radiation.  I discussed this with him.  In regards to his gross hematuria, this is improving.  In regards to the right-sided hydronephrosis, he does not desire a nephrostomy tube and his creatinine has been stable.  He has mild left-sided hydronephrosis likely secondary to recent instrumentation but he has been making good urine output and again his creatinine is stable so we can just observe this.   Link Snuffer, MD Urology 09/23/2018, 5:25 PM

## 2018-09-24 DIAGNOSIS — N1330 Unspecified hydronephrosis: Secondary | ICD-10-CM | POA: Diagnosis not present

## 2018-09-24 DIAGNOSIS — C670 Malignant neoplasm of trigone of bladder: Secondary | ICD-10-CM | POA: Diagnosis not present

## 2018-09-24 DIAGNOSIS — N179 Acute kidney failure, unspecified: Secondary | ICD-10-CM | POA: Diagnosis not present

## 2018-09-24 DIAGNOSIS — C679 Malignant neoplasm of bladder, unspecified: Secondary | ICD-10-CM | POA: Diagnosis not present

## 2018-09-24 DIAGNOSIS — Z95811 Presence of heart assist device: Secondary | ICD-10-CM | POA: Diagnosis not present

## 2018-09-24 DIAGNOSIS — C672 Malignant neoplasm of lateral wall of bladder: Secondary | ICD-10-CM | POA: Diagnosis not present

## 2018-09-24 LAB — BASIC METABOLIC PANEL
Anion gap: 5 (ref 5–15)
BUN: 37 mg/dL — AB (ref 8–23)
CO2: 25 mmol/L (ref 22–32)
CREATININE: 1.88 mg/dL — AB (ref 0.61–1.24)
Calcium: 8.8 mg/dL — ABNORMAL LOW (ref 8.9–10.3)
Chloride: 104 mmol/L (ref 98–111)
GFR calc Af Amer: 38 mL/min — ABNORMAL LOW (ref >60)
GFR, EST NON AFRICAN AMERICAN: 33 mL/min — AB (ref >60)
GLUCOSE: 111 mg/dL — AB (ref 70–99)
POTASSIUM: 4.7 mmol/L (ref 3.5–5.1)
SODIUM: 134 mmol/L — AB (ref 135–145)

## 2018-09-24 LAB — CBC
HEMATOCRIT: 30.8 % — AB (ref 39.0–52.0)
Hemoglobin: 9.2 g/dL — ABNORMAL LOW (ref 13.0–17.0)
MCH: 29.1 pg (ref 26.0–34.0)
MCHC: 29.9 g/dL — AB (ref 30.0–36.0)
MCV: 97.5 fL (ref 80.0–100.0)
NRBC: 0 % (ref 0.0–0.2)
PLATELETS: 140 10*3/uL — AB (ref 150–400)
RBC: 3.16 MIL/uL — ABNORMAL LOW (ref 4.22–5.81)
RDW: 16.8 % — AB (ref 11.5–15.5)
WBC: 7 10*3/uL (ref 4.0–10.5)

## 2018-09-24 LAB — PROTIME-INR
INR: 1.33
Prothrombin Time: 16.4 seconds — ABNORMAL HIGH (ref 11.4–15.2)

## 2018-09-24 LAB — LACTATE DEHYDROGENASE: LDH: 201 U/L — ABNORMAL HIGH (ref 98–192)

## 2018-09-24 MED ORDER — SODIUM CHLORIDE 0.9 % IV SOLN: 1.0000 g | INTRAVENOUS | Status: DC

## 2018-09-24 MED ORDER — WARFARIN SODIUM 2 MG PO TABS
2.0000 mg | ORAL_TABLET | Freq: Once | ORAL | Status: AC
  Administered 2018-09-24: 2 mg via ORAL
  Filled 2018-09-24: qty 1

## 2018-09-24 MED ORDER — SODIUM CHLORIDE 0.9 % IV SOLN
1.0000 g | INTRAVENOUS | Status: AC
  Administered 2018-09-24 – 2018-09-28 (×5): 1 g via INTRAVENOUS
  Filled 2018-09-24 (×5): qty 10

## 2018-09-24 NOTE — Progress Notes (Signed)
Nutrition Follow-up  DOCUMENTATION CODES:   Non-severe (moderate) malnutrition in context of chronic illness  INTERVENTION:   -Continue Boost Plus BID, each supplement provides 360 kcals and 14 grams protein -Cotninue 200 mg zinc sulfate daily  NUTRITION DIAGNOSIS:   Moderate Malnutrition related to chronic illness(advanced heart failure, bladder cancer) as evidenced by mild fat depletion, moderate fat depletion, mild muscle depletion, moderate muscle depletion, percent weight loss.  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 He is s/p HM II LVAD implant for destination therapy on October 19, 2013. VAD implantation was complicated by VT, syncope, AF/Aflutter and RHF. Required short term milrinone.  He is being admitted today for heparin bridge for recurrent bladder tumor resection on 09/19/18 with Dr Gloriann Loan.  11/14- renal ultrasound reveals recurrent R hydronephrosis without BOO and recurrent bladder tumor 11/15- s/p procedures: Transurethral resection of bladder tumor--large; Left retrograde pyelogram; Cystogram 11/16- repeat renal ultrasound reveals moderate R hydronephrosis (slightly improved) and new left mild hydro 11/18- CT of abdomen and pelvis reveals no metastatic or locally invasive disease. Bilateral hydronephrosis L>R  Reviewed I/O's: -963 ml x 24 hours and -2.9 L since admission  Reviewed urology note from 09/23/18; pt with muscle invasive bladder cancer (not a candidate for radical cystectomy or chemo, however, may be a candidate for palliative radiation). Pt refusing nephrostomy tube.   Pt resting quietly at time of visit; RD did not  disturb. Case discussed with RN, who reports pt with good appetite and consuming supplements most of the time. Noted meal completion 50-100%.   Labs reviewed: Na: 134.   Diet Order:   Diet Order            Diet Heart Room service appropriate? Yes; Fluid consistency: Thin  Diet effective now              EDUCATION NEEDS:   Education needs have been addressed  Skin:  Skin Assessment: Skin Integrity Issues: Skin Integrity Issues:: Incisions Incisions: closed penile  Last BM:  09/23/18  Height:   Ht Readings from Last 1 Encounters:  09/19/18 6' 0.01" (1.829 m)    Weight:   Wt Readings from Last 1 Encounters:  09/24/18 78.1 kg    Ideal Body Weight:  80.9 kg  BMI:  Body mass index is 23.35 kg/m.  Estimated Nutritional Needs:   Kcal:  2200-2400  Protein:  105-120 grams  Fluid:  2.2-2.4 L    [name] A. [name], RD, LDN, CDE Pager: [phone] After hours Pager: [phone]

## 2018-09-24 NOTE — Progress Notes (Signed)
Patient unable to void easily with foley catheter, urine coming around catheter site. RN called urologist who recommended placing 20Fr catheter and irrigating bladder. RN placed 20Fr foley with minimal bloody output. RN then irrigated bladder with sterile saline. Multiple clots irrigated from bladder, clearing foley obstruction. Will continue to monitor for obstruction.

## 2018-09-24 NOTE — Progress Notes (Addendum)
Advanced Heart Failure VAD Team Note  PCP-Cardiologist: No primary care provider on file.   Subjective:    Admitted for resection of recurrent bladder tumor. Renal u/s obtained and shows recurrent R hydronephrosis without BOO. There is evidence of recurrent bladder tumor as well.  Ramp echo 11/14 speed turned down to 8600 Went to OR 11/15 for bladder tumor resection. Multiple large tumors. Unable to uncover R ureteral orifice.  Repeat renal u/s showed moderate R hydronephrosis (slightly improved) and new left mild hydro.  Remains on heparin drip 600 units per hour+ coumadin. INR 1.33.   Yesterday foley was removed but he had urinary retention. Foley was replaced last night. Now with hematuria.   Denies SOB. Denies pain.   LVAD Interrogation HM 2: Speed: 8600 Flow: 3.7 PI:5.9  Power: 4.0 VAD interrogated personally.    Objective:    Vital Signs:   Temp:  [97.9 F (36.6 C)-98.8 F (37.1 C)] 98 F (36.7 C) (11/20 0755) Pulse Rate:  [74] 74 (11/20 0755) Resp:  [13-20] 20 (11/20 0755) BP: (67-100)/(53-84) 100/78 (11/20 0755) SpO2:  [96 %-98 %] 96 % (11/20 0755) Weight:  [78.1 kg] 78.1 kg (11/20 0624) Last BM Date: 09/23/18 Mean arterial Pressure 70-80s  Intake/Output:   Intake/Output Summary (Last 24 hours) at 09/24/2018 0812 Last data filed at 09/24/2018 0658 Gross per 24 hour  Intake 878.4 ml  Output 1501 ml  Net -622.6 ml     Physical Exam   Physical Exam: GENERAL: No acute distress. In bed.  HEENT: normal  NECK: Supple, JVP 5-6  .  2+ bilaterally, no bruits.  No lymphadenopathy or thyromegaly appreciated.  CARDIAC:  Mechanical heart sounds with LVAD hum present.  LUNGS:  Clear to auscultation bilaterally.  ABDOMEN:  Soft, round, nontender, positive bowel sounds x4.     LVAD exit site: well-healed and incorporated.  Dressing dry and intact.  No erythema or drainage.  Stabilization device present and accurately applied.  Driveline dressing is being changed  daily per sterile technique. EXTREMITIES:  Warm and dry, no cyanosis, clubbing, rash or edema  NEUROLOGIC:  Alert and oriented x 4.  No aphasia.  No dysarthria.  Affect pleasant.   GU: foley - hematuria.       Telemetry    A paced 90s personally reviewed.    Labs   Basic Metabolic Panel: Recent Labs  Lab 09/17/18 1343  09/20/18 0500 09/21/18 0637 09/22/18 0439 09/23/18 0306 09/24/18 0205  NA 134*   < > 137 137 135 136 134*  K 4.3   < > 4.8 4.9 5.0 4.8 4.7  CL 105   < > 107 107 104 106 104  CO2 22   < > 23 25 26 25 25   GLUCOSE 131*   < > 122* 94 100* 101* 111*  BUN 63*   < > 50* 44* 43* 42* 37*  CREATININE 2.16*   < > 2.20* 2.10* 1.97* 2.03* 1.88*  CALCIUM 8.8*   < > 8.6* 8.9 8.6* 8.6* 8.8*  MG 2.1  --   --   --   --   --   --    < > = values in this interval not displayed.    Liver Function Tests: Recent Labs  Lab 09/17/18 1343  AST 36  ALT 33  ALKPHOS 172*  BILITOT 0.6  PROT 7.1  ALBUMIN 3.3*   No results for input(s): LIPASE, AMYLASE in the last 168 hours. No results for input(s): AMMONIA in the last  168 hours.  CBC: Recent Labs  Lab 09/17/18 1343  09/20/18 0500 09/21/18 0637 09/22/18 0439 09/23/18 0306 09/24/18 0205  WBC 6.6   < > 10.4 7.8 6.8 7.0 7.0  NEUTROABS 4.3  --   --   --   --   --   --   HGB 11.0*   < > 9.8* 9.5* 9.5* 9.5* 9.2*  HCT 35.8*   < > 31.6* 31.5* 31.4* 30.6* 30.8*  MCV 96.2   < > 96.6 97.5 97.5 97.5 97.5  PLT 149*   < > 124* 117* 128* 128* 140*   < > = values in this interval not displayed.    INR: Recent Labs  Lab 09/20/18 0500 09/21/18 7425 09/22/18 0439 09/23/18 0306 09/24/18 0205  INR 1.28 1.24 1.34 1.35 1.33    Other results:  EKG:    Imaging   Ct Abdomen Pelvis Wo Contrast  Result Date: 09/22/2018 CLINICAL DATA:  77 year old male or bladder cancer staging. EXAM: CT ABDOMEN AND PELVIS WITHOUT CONTRAST TECHNIQUE: Multidetector CT imaging of the abdomen and pelvis was performed following the standard  protocol without IV contrast, which could not be administered due to renal insufficiency. COMPARISON:  12/14/2017 and prior CTs FINDINGS: Please note that parenchymal abnormalities may be missed without intravenous contrast. Lower chest: Cardiomegaly, cardiac surgical changes, pacemaker lead and LEFT ventricular assist device again identified. Emphysema again noted. Hepatobiliary: The liver is unremarkable. Patient is status post cholecystectomy. No biliary dilatation. Pancreas: Unremarkable Spleen: Unremarkable Adrenals/Urinary Tract: New moderate to severe RIGHT hydroureteronephrosis to the bladder is identified. Mild to moderate LEFT hydroureteronephrosis to the bladder is unchanged. Foley catheter within the bladder is again identified. Low-density renal lesions/cysts are unchanged. The adrenal glands are unremarkable. Stomach/Bowel: Stomach is within normal limits. No evidence of bowel wall thickening, distention, or inflammatory changes. Vascular/Lymphatic: Aortoiliac repair/grafts noted. Aortic atherosclerotic calcifications noted. No enlarged lymph nodes. Reproductive: No definite prostate abnormality. Other: Presacral/precoccygeal stranding/edema again noted. No ascites, focal collection or pneumoperitoneum. Musculoskeletal: No acute or suspicious bony abnormalities. L4 compression fracture and LOWER lumbar fusion hardware again noted. IMPRESSION: 1. New moderate to severe RIGHT hydroureteronephrosis to the bladder without obstructing cause identified on this study. 2. Unchanged mild to moderate LEFT hydroureteronephrosis to the bladder. Foley catheter within the bladder again identified. 3. No suspicious bony abnormalities or evidence of metastatic disease on this study. 4. Cardiomegaly and LEFT ventricular assist device 5. Aortic Atherosclerosis (ICD10-I70.0) and Emphysema (ICD10-J43.9). Electronically Signed   By: Margarette Canada M.D.   On: 09/22/2018 13:00     Medications:     Scheduled  Medications: . sodium chloride   Intravenous Once  . amiodarone  200 mg Oral BID  . atorvastatin  80 mg Oral QHS  . docusate sodium  200 mg Oral Daily  . lactose free nutrition  237 mL Oral BID BM  . levothyroxine  150 mcg Oral QAC breakfast  . multivitamin with minerals  1 tablet Oral Daily  . pantoprazole  40 mg Oral BID AC  . sodium chloride flush  10-40 mL Intracatheter Q12H  . sotalol  80 mg Oral BID  . Warfarin - Pharmacist Dosing Inpatient   Does not apply q1800  . zinc sulfate  220 mg Oral Daily    Infusions: . sodium chloride 10 mL/hr at 09/21/18 1830  . heparin 600 Units/hr (09/23/18 0754)    PRN Medications: acetaminophen, iothalamate meglumine, promethazine, sodium chloride flush, temazepam   Patient Profile   [name] is  a 77 y.o. male with h/o VT, PAD and severe systolic HF (EF 16-10%) due to mixed ischemic/nonischemic CM. He is s/p HM II LVAD implant for destination therapy on October 19, 2013. VAD implantation was complicated by VT, syncope, AF/Aflutter and RHF. Required short term milrinone.   Admitted for heparin bridge for bladder tumor resection on 09/19/18.  Assessment/Plan:    1. 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  - Had 2 BCG infusions with Dr. Gloriann Loan but discontinued due to inability to tolerate - 7/12 s/p TURB with right ureteral stent placement.  - Renal u/s 11/14 with recurrent R hydronephrosis without BOO. There is evidence of recurrent bladder tumor as well - Underwent recurrent bladder tumor resection 09/19/18 with Dr Gloriann Loan. Found to be large fast-growing tumor with obstruction of right ureteral orifice - Repeat renal u/s 11/16 shows moderate R hydronephrosis (slightly improved) and new left mild hydro.  - CT ab/pelvis 11/18 no metastatic or locally invasive disease. 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  - Foley removed 11/19 but he could not void so foley was replaced.   - On heparin drip + coumadin. INR 1.33.  - Further plans per Urology and Dr Haroldine Laws. He does not want cystectomy. He would like to consider radiation and possibly chemo.  2. Chronic systolic HF s/p HM-II LVAD 10/2014  - RHC in 1/19 with mildly low output.  - Ramp echo 11/14 speed turned down to 8600. Moderate RV HK. VAD interrogated personally. Parameters stable. (LVAD speed reduced to 8800 in 10/17 due to RV failure) - s/p previous driveline repair. No pump stops - Continue heparin. INR 1.35/.  - VAD interrogated personally. Parameters stable. - LDH 201.  3. 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 Required HD x1 - Creatininebaseline 1.4-1.5 - CR 2.16 -> 2.18 -> 2.26 -> 2.20 -> 2.10->1.97-> 2.03-->1.8    in setting of R> L hydronephrosis - Bladder tumor debulked but unable to find R ureteral orifice. Discussed possibility of perc tube with Urology but no indication currently.  - Mr. Decicco says he would not want nephrostomy. - Foley replaced last night.  4 h/o.RecurrentVTwith hypotension and low flow alarms on VAD: Underwent DC-CV 9/17, 08/17/17, and 04/01/18. S/p emergent DC-CV in ER 07/09/18  - Had several low flow alarms on 11/9, 11/4, and 10/31. ICD shows several runs of AT 10/24-10/28, all lasting about an hour. Nothing that correlates with low flow alarms.  -Quiescent VT onSotalol80mg BIDand amio - QTC slightly prolonged but stable. Previously reviewed with Dr. Caryl Comes and felt OK.  - Keep K > 4.0 Mg > 2.0. Stable.  5. Atrial arrhythmias: Atrial fibrillation, atrial tachycardia. S/p DC-CV 06/04/14, DC-CV 03/02/15 and 01/19/16.  -Remains in NSR. Device interrogation this admit shows multiple brief runs atrial tach 6. H/o Recurrent GIB - Most recent bleed 12/18. hgb 6.5, Transfused 5u. EGD with 2 AVMs treated with APC - No bleeding issues.  - Hgb  9.2.  - Has been getting octreotide infusions but too expensive for him. Holding for now.    I reviewed the LVAD parameters from today, and compared the results to the patient's prior recorded data.  No programming changes were made.  The LVAD is functioning within specified parameters.  The patient performs LVAD self-test daily.  LVAD interrogation was negative for any significant power changes, alarms or PI events/speed  drops.  LVAD equipment check completed and is in good working order.  Back-up equipment present.   LVAD education done on emergency procedures and precautions and reviewed exit site care.   Length of Stay: 7  Darrick Grinder, NP 09/24/2018, 8:12 AM  VAD Team --- VAD ISSUES ONLY--- Pager [phone] (7am - 7am)  Advanced Heart Failure Team  Pager [phone] (M-F; 7a - 4p)  Please contact [facility] for night-coverage after hours (4p -7a ) and weekends on [email or web address]  Patient seen and examined with Darrick Grinder, NP. We discussed all aspects of the encounter. I agree with the assessment and plan as stated above.   He had urinary retention and clots last night and Foley had to be replaced. Now with increased hematuria. Will stop heparin. Continue coumadin. HGb stable.   Long discussion about options for aggressive bladder tumor and he is willing to consider palliative XRT if this would help. I have d/w Dr. Gloriann Loan and we will d/w Rad-Onc team. Creatinine stable despite hydronephrosis. Will continue to follow. We discussed option of comfort care as well but would like to see if XRT is an option first. Volume status ok. VAD interrogated personally. Parameters stable.  Glori Bickers, MD  11:44 AM

## 2018-09-24 NOTE — Progress Notes (Signed)
Patient complained of inability to pass urine.  Burning sensation present when trying.  Bladder scan revealed 39mL.  VAD Coordinator called who contacted Dr. Haroldine Laws, orders placed for Foley catheter.  Patient tolerated well, now denies discomfort.  Will continue to monitor.

## 2018-09-24 NOTE — Progress Notes (Signed)
LVAD dressing changed using sterile technique on 11/20 by Adelene Idler RN

## 2018-09-24 NOTE — Progress Notes (Signed)
3-way 22 Fr.catheter inserted by Dr. Gloriann Loan at bedside with bloody output. RN and MD initiated continuous bladder irrigation with verbal instruction to titrate drip as needed to maintain consistent urine output.

## 2018-09-24 NOTE — Progress Notes (Signed)
Urology Inpatient Progress Report  LVAD BLADDER CANCER  Procedure(s): TRANSURETHRAL RESECTION OF BLADDER TUMOR left  RETROGRADE PYELOGRAM CYSTOGRAM  5 Days Post-Op   Intv/Subj: Hemoglobin is stable but the patient was unable to void last night.  Foley catheter was placed.  It was felt that he had some clot in the bladder and therefore he was upsized to 20 [place].  Creatinine improved a small amount to 1.88.  Principal Problem:   Bladder cancer [facility]) Active Problems:   Atrial tachycardia (HCC)   LVAD (left ventricular assist device) present ([employer])   AKI (acute kidney injury) ([place])   Hydronephrosis of right kidney  Current Facility-Administered Medications  Medication Dose Route Frequency Provider Last Rate Last Dose  . 0.9 %  sodium chloride infusion (Manually program via Guardrails IV Fluids)   Intravenous Once Bensimhon, Shaune Pascal, MD      . 0.9 %  sodium chloride infusion   Intravenous Continuous Oleta Mouse, MD 10 mL/hr at 09/21/18 1830    . acetaminophen (TYLENOL) tablet 650 mg  650 mg Oral Q4H PRN Georgiana Shore, NP      . amiodarone (PACERONE) tablet 200 mg  200 mg Oral BID Georgiana Shore, NP   200 mg at 09/24/18 0941  . atorvastatin (LIPITOR) tablet 80 mg  80 mg Oral QHS Georgiana Shore, NP   80 mg at 09/23/18 2139  . cefTRIAXone (ROCEPHIN) 1 g in sodium chloride 0.9 % 100 mL IVPB  1 g Intravenous Q24H Lore, Melissa A, RPH 200 mL/hr at 09/24/18 1318 1 g at 09/24/18 1318  . docusate sodium (COLACE) capsule 200 mg  200 mg Oral Daily Georgiana Shore, NP   200 mg at 09/23/18 1028  . iothalamate meglumine (CYSTO-CONRAY II) 17.2 % solution 250 mL  250 mL Intravesical Once PRN Marton Redwood III, MD      . lactose free nutrition (BOOST PLUS) liquid 237 mL  237 mL Oral BID BM Bensimhon, Shaune Pascal, MD   237 mL at 09/24/18 0941  . levothyroxine (SYNTHROID, LEVOTHROID) tablet 150 mcg  150 mcg Oral QAC breakfast Georgiana Shore, NP   150 mcg at 09/24/18 5462  . multivitamin with  minerals tablet 1 tablet  1 tablet Oral Daily Georgiana Shore, NP   1 tablet at 09/24/18 0941  . pantoprazole (PROTONIX) EC tablet 40 mg  40 mg Oral BID AC Georgiana Shore, NP   40 mg at 09/24/18 1750  . promethazine (PHENERGAN) tablet 12.5 mg  12.5 mg Oral Q6H PRN Georgiana Shore, NP      . sodium chloride flush (NS) 0.9 % injection 10-40 mL  10-40 mL Intracatheter Q12H Bensimhon, Shaune Pascal, MD   10 mL at 09/24/18 0942  . sodium chloride flush (NS) 0.9 % injection 10-40 mL  10-40 mL Intracatheter PRN Bensimhon, Shaune Pascal, MD      . sotalol (BETAPACE) tablet 80 mg  80 mg Oral BID Georgiana Shore, NP   80 mg at 09/24/18 0941  . temazepam (RESTORIL) capsule 30 mg  30 mg Oral QHS PRN Georgiana Shore, NP   30 mg at 09/23/18 2139  . Warfarin - Pharmacist Dosing Inpatient   Does not apply q1800 Wilburn Cornelia, RPH      . zinc sulfate capsule 220 mg  220 mg Oral Daily Bensimhon, Shaune Pascal, MD   220 mg at 09/24/18 0941     Objective: Vital: Vitals:   09/24/18 0624 09/24/18 0755 09/24/18  1148 09/24/18 1503  BP:  100/78 (!) 86/66 (!) 82/69  Pulse:  74 81 93  Resp:  20 20 20   Temp:  98 F (36.7 C) 98.3 F (36.8 C) 98.4 F (36.9 C)  TempSrc:  Oral Oral Oral  SpO2:  96% 96% 97%  Weight: 78.1 kg     Height:       I/Os: I/O last 3 completed shifts: In: 7106 [P.O.:740; I.V.:276] Out: 2336 [Urine:2335; Stool:1]  Physical Exam:  General: Patient is in no apparent distress Lungs: Normal respiratory effort, chest expands symmetrically. GI:The abdomen is soft and nontender without mass. Foley: Dark red urine is draining Ext: lower extremities symmetric  Lab Results: Recent Labs    09/22/18 0439 09/23/18 0306 09/24/18 0205  WBC 6.8 7.0 7.0  HGB 9.5* 9.5* 9.2*  HCT 31.4* 30.6* 30.8*   Recent Labs    09/22/18 0439 09/23/18 0306 09/24/18 0205  NA 135 136 134*  K 5.0 4.8 4.7  CL 104 106 104  CO2 26 25 25   GLUCOSE 100* 101* 111*  BUN 43* 42* 37*  CREATININE 1.97* 2.03* 1.88*  CALCIUM  8.6* 8.6* 8.8*   Recent Labs    09/22/18 0439 09/23/18 0306 09/24/18 0205  INR 1.34 1.35 1.33   No results for input(s): LABURIN in the last 72 hours. Results for orders placed or performed during the hospital encounter of 09/17/18  MRSA PCR Screening     Status: None   Collection Time: 09/17/18  1:36 PM  Result Value Ref Range Status   MRSA by PCR NEGATIVE NEGATIVE Final    Comment:        The GeneXpert MRSA Assay (FDA approved for NASAL specimens only), is one component of a comprehensive MRSA colonization surveillance program. It is not intended to diagnose MRSA infection nor to guide or monitor treatment for MRSA infections. Performed at [facility], [place] [address], [place], [place] [address]    *Note: Due to a large number of results and/or encounters for the requested time period, some results have not been displayed. A complete set of results can be found in Results Review.    Studies/Results: No results found.   Procedure: Under sterile conditions, I first irrigated the 20 French catheter with sterile water.  There was return of moderate clot.  I irrigated about 2 L.  I then remove the catheter and under sterile conditions placed a 22 French three-way Foley catheter and initiated continuous bladder irrigation.  The output was clear on moderate drip  Assessment: Muscle invasive bladder cancer Bilateral hydronephrosis, right greater than left Gross hematuria  Procedure(s): TRANSURETHRAL RESECTION OF BLADDER TUMOR left  RETROGRADE PYELOGRAM CYSTOGRAM, 5 Days Post-Op  doing well.  Plan: Decision of what to do about anticoagulation will be left up to cardiology service.  I will continue him on continuous bladder irrigation for now and try to wean him off over the next couple of days.  For his bladder cancer, I spoke with radiation oncology today.  We will get him in with him outpatient.  Potential radiation options include palliative treatment to prevent  hematuria as well as complete 6-week treatment to help prevent progression.  I discussed this with the patient as well as his wife.  Observe hydronephrosis.  Creatinine is able to mildly improved.  Patient does not want aggressive treatment with nephrostomy tube and I would agree with this at this point.    Link Snuffer, MD Urology 09/24/2018, 5:50 PM

## 2018-09-24 NOTE — Progress Notes (Signed)
ANTICOAGULATION CONSULT NOTE - Follow Up Consult  Pharmacy Consult for warfarin / heparin Indication: LVAD  Allergies  Allergen Reactions  . Demerol [Meperidine] Other (See Comments)    Paralysis/ Could only move eyes    Patient Measurements: Height: 6' 0.01" (182.9 cm) Weight: 172 lb 2.9 oz (78.1 kg) IBW/kg (Calculated) : 77.62 Heparin Dosing Weight: 77kg  Vital Signs: Temp: 98 F (36.7 C) (11/20 0755) Temp Source: Oral (11/20 0755) BP: 100/78 (11/20 0755) Pulse Rate: 74 (11/20 0755)  Labs: Recent Labs    09/22/18 0439 09/22/18 0440 09/23/18 0305 09/23/18 0306 09/24/18 0205  HGB 9.5*  --   --  9.5* 9.2*  HCT 31.4*  --   --  30.6* 30.8*  PLT 128*  --   --  128* 140*  LABPROT 16.5*  --   --  16.6* 16.4*  INR 1.34  --   --  1.35 1.33  HEPARINUNFRC  --  <0.10* <0.10*  --   --   CREATININE 1.97*  --   --  2.03* 1.88*    Estimated Creatinine Clearance: 35.5 mL/min (A) (by C-G formula based on SCr of 1.88 mg/dL (H)).   Medical History: Past Medical History:  Diagnosis Date  . Arthritis   . Automatic implantable cardioverter-defibrillator in situ    a. s/p Medtronic Evera device serial [id number]    . Bradycardia   . CAD (coronary artery disease)    a. s/p MI 1982;  s/p DES to CFX 2011;  s/p NSTEMI 4/12 in setting of VTach;  cath 7/12:  LM ok, LAD mid 30-40%, Dx's with 40%, mCFX stent patent, prox to mid RCA occluded with R to R and L to R collats.  Medical Tx was continued  . Chronic systolic heart failure ([place])    a. s/p LVAD 10/2013 for destination therapy  . CKD (chronic kidney disease)   . CVD (cerebrovascular disease)    a. s/p L CEA 2008  . Cytopenia   . Dysrhythmia    AFIB  . History of GI bleed    a. on ASA- started on plavix during 10/2015 admission for CVA  . HLD (hyperlipidemia)   . HTN (hypertension)   . Hypothyroidism   . Inappropriate therapy from implantable cardioverter-defibrillator    a. ATP for sinus tach SVT wavelet identified SVT but  was no  passive (2014)  . Ischemic cardiomyopathy   . 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 L Ear   . Myocardial infarction ([place])    1992  . PAF (paroxysmal atrial fibrillation) ([place])   . PVD (peripheral vascular disease) ([place])    a. 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 Shortness of breath dyspnea    W/ EXERTION   . Sleep apnea    NO CPAP NOW  HE SENT IT BACK YRS AGO  . Stroke [facility])    a. 10/2015 admission   . Ventricular tachycardia ([place])    a. s/p AICD;   h/o ICD shock;  Amiodarone Rx. S/P ablation in 2012   Assessment: 77 year old male with history of LVAD implant being admitted for hold of warfarin and heparin bridge in anticipation of bladder tumor resection on 11/15. PTA warfarin dose is 2 mg daily. Pt did receive vitamin K 2.5 mg po x1 on 11/14 and + 2 units of FFP on  11/15 + increased home Boost TID- which has some vitamin K.   INR essentially unchanged at 1.33 today after restarting warfarin 11/19. Had foley exchange yesterday and is again having hematuria. Hemoglobin is stable. Will stop heparin per Dr. Haroldine Laws and dose warfarin more conservatively given ongoing hematuria.  Goal of Therapy:  INR 2.0-2.5 Heparin level 0.3-0.5 units/ml Monitor platelets by anticoagulation protocol: Yes   Plan:  Stop heparin Warfarin 2 mg PO x1 tonight Daily INR Monitor resolution of hematuria  Jackson Latino, PharmD PGY1 Pharmacy Resident Phone [phone] 09/24/2018     11:07 AM

## 2018-09-24 NOTE — Progress Notes (Signed)
LVAD Coordinator Rounding Note:  Admitted on 09/17/18 for heparin bridge for bladder tumor resection scheduled on Friday 11/15.   HM II Implanted 10/19/13 by Dr. Cyndia Bent under Destination Therapy criteria.  Pt awake, sitting up in bed, just finished breakfast. Pt states that he had telephone conversation with Dr. Haroldine Laws last night and is planning on meeting with him this am to discuss possible radiation therapy for his bladder cancer. Pt says this is the path he wants to take, but wants to confirm with Dr. Haroldine Laws.   Vital signs: Temp: 98.4 HR: 93 Doppler Pressure:80 Automatic cuff: 100/78 (86) O2 Sat: 97% on RA  Weight:  170.5>168.4>167.9>163.8>172.1>172.6>172.1 lbs  LVAD interrogation reveals:  Speed: 8600 Flow:3.7 Power: 4.1w PI: 6.9  Alarms: none over last 24 hrs Events:5 - 10 PI events daily  Fixed speed: 8600 Low speed limit: 8000  Drive Line: Weekly dressings performed by bedside nurse or SO [name]. Next dressing change due 09/29/18.  Labs:  LDH trend: 261>219>208>199>193>201  INR trend:  2.5>2.18>1.92>1.34>1.35>1.35  Creat trend: 1.55>2.18>2.26>1.97>2.03>1.88  Anticoagulation Plan: - INR Goal: 2.0 - 2.5 (re-started coumadin 09/23/18 post surgery) - ASA Dose: no ASA (removed 02/2017 due to GI bleed) - Heparin gtt per pharmacy  Device: -Dual Medtronic ICD -Therapies: on at 200 bpm - Last device check 10/02/17  Significant Events on VAD Support:  09/01/14>>ureteral stone with cystoscopy and flexible ureteroscopy with lithotripsy and basket extraction 02/2015 >>GIB with 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 Started on Plavix 3/17>>Lumbar fusion L3-L5 9/17>>VT with DC-CV; amio increased 10/17>>ramp with RV failure; speed decreased to 8800 02/2017> GIB (removed ASA, scopes  negative) 03/2017>percutaneous lead repair after pump stops  10/18>>Slow VT. Cardioverted in ED and amiodarone uptitrated 12/18>>melena with scope. Hgb 6.5. EGD with 2 AVMs with APC 12/18>>recurrent hematuria. 6 mm left ureteral stone 1/19>>7 mm left ureteral stone removal with laser lithotripsy; left ureteral stent. Resection of 6 cm bladder tumor    Plan/Recommendations:  1. Please call VAD pager if any issues with VAD equipment or questions, concerns.   Zada Girt RN [place] Coordinator  Office: [phone]  24/7 Pager: [phone]

## 2018-09-24 NOTE — Plan of Care (Signed)
Pt continues to progress through plan of care.

## 2018-09-25 ENCOUNTER — Ambulatory Visit: Payer: Medicare Other | Admitting: Radiation Oncology

## 2018-09-25 ENCOUNTER — Ambulatory Visit
Admit: 2018-09-25 | Discharge: 2018-09-25 | Disposition: A | Discharge status: Home / Self Care | Payer: Medicare Other | Attending: Radiation Oncology | Admitting: Radiation Oncology

## 2018-09-25 DIAGNOSIS — C679 Malignant neoplasm of bladder, unspecified: Secondary | ICD-10-CM

## 2018-09-25 DIAGNOSIS — C672 Malignant neoplasm of lateral wall of bladder: Secondary | ICD-10-CM | POA: Diagnosis not present

## 2018-09-25 DIAGNOSIS — I471 Supraventricular tachycardia: Secondary | ICD-10-CM | POA: Diagnosis not present

## 2018-09-25 DIAGNOSIS — Z95811 Presence of heart assist device: Secondary | ICD-10-CM | POA: Diagnosis not present

## 2018-09-25 DIAGNOSIS — R310 Gross hematuria: Secondary | ICD-10-CM

## 2018-09-25 DIAGNOSIS — C670 Malignant neoplasm of trigone of bladder: Secondary | ICD-10-CM | POA: Diagnosis not present

## 2018-09-25 DIAGNOSIS — N179 Acute kidney failure, unspecified: Secondary | ICD-10-CM | POA: Diagnosis not present

## 2018-09-25 LAB — BASIC METABOLIC PANEL
ANION GAP: 8 (ref 5–15)
BUN: 32 mg/dL — ABNORMAL HIGH (ref 8–23)
CALCIUM: 8.6 mg/dL — AB (ref 8.9–10.3)
CO2: 23 mmol/L (ref 22–32)
Chloride: 104 mmol/L (ref 98–111)
Creatinine, Ser: 2.09 mg/dL — ABNORMAL HIGH (ref 0.61–1.24)
GFR, EST AFRICAN AMERICAN: 33 mL/min — AB (ref >60)
GFR, EST NON AFRICAN AMERICAN: 29 mL/min — AB (ref >60)
Glucose, Bld: 146 mg/dL — ABNORMAL HIGH (ref 70–99)
Potassium: 4.2 mmol/L (ref 3.5–5.1)
Sodium: 135 mmol/L (ref 135–145)

## 2018-09-25 LAB — CBC
HEMATOCRIT: 29.3 % — AB (ref 39.0–52.0)
HEMOGLOBIN: 8.7 g/dL — AB (ref 13.0–17.0)
MCH: 29.3 pg (ref 26.0–34.0)
MCHC: 29.7 g/dL — ABNORMAL LOW (ref 30.0–36.0)
MCV: 98.7 fL (ref 80.0–100.0)
Platelets: 133 10*3/uL — ABNORMAL LOW (ref 150–400)
RBC: 2.97 MIL/uL — AB (ref 4.22–5.81)
RDW: 17.2 % — ABNORMAL HIGH (ref 11.5–15.5)
WBC: 7.9 10*3/uL (ref 4.0–10.5)
nRBC: 0 % (ref 0.0–0.2)

## 2018-09-25 LAB — LACTATE DEHYDROGENASE: LDH: 184 U/L (ref 98–192)

## 2018-09-25 LAB — PROTIME-INR
INR: 1.33
Prothrombin Time: 16.3 seconds — ABNORMAL HIGH (ref 11.4–15.2)

## 2018-09-25 LAB — PREPARE RBC (CROSSMATCH)

## 2018-09-25 MED ORDER — WARFARIN SODIUM 2 MG PO TABS
2.0000 mg | ORAL_TABLET | Freq: Once | ORAL | Status: AC
  Administered 2018-09-25: 2 mg via ORAL
  Filled 2018-09-25: qty 1

## 2018-09-25 MED ORDER — SODIUM CHLORIDE 0.9% IV SOLUTION: Freq: Once | INTRAVENOUS | Status: DC

## 2018-09-25 MED ORDER — SODIUM CHLORIDE 0.9 % IV BOLUS: 250.0000 mL | Freq: Once | INTRAVENOUS | Status: AC

## 2018-09-25 MED ORDER — SODIUM CHLORIDE 0.9 % IV BOLUS
500.0000 mL | Freq: Once | INTRAVENOUS | Status: AC
  Administered 2018-09-26: 500 mL via INTRAVENOUS

## 2018-09-25 MED ORDER — SODIUM CHLORIDE 0.9% IV SOLUTION
Freq: Once | INTRAVENOUS | Status: AC
  Administered 2018-09-25: 13:00:00 via INTRAVENOUS

## 2018-09-25 NOTE — Progress Notes (Addendum)
LVAD Coordinator Rounding Note:  Admitted on 09/17/18 for heparin bridge for bladder tumor resection scheduled on Friday 11/15.   HM II Implanted 10/19/13 by Dr. Cyndia Bent under Destination Therapy criteria.  Pt awake, lying in bed. States that after talking things over with Dr. Haroldine Laws he would like to pursue radiation for bladder CA. Pt is currently undergoing CBI for clots in his bladder.    Vital signs: Temp: 97.5 HR: 76 Doppler Pressure:92 Automatic cuff: 88/73 (80) O2 Sat: 97% on RA  Weight:  170.5>168.4>167.9>163.8>172.1>172.6>172.1>168.2 lbs  LVAD interrogation reveals:  Speed: 8600 Flow:3.5 Power: 4.0w PI: 3.9  Alarms: pt had 15 Low Flows between 0500-0600 this morning-pt states that he started drinking Events:5 - 10 PI events daily  Fixed speed: 8600 Low speed limit: 8000  Drive Line: Weekly dressings performed by bedside nurse or SO [name]. Next dressing change due 09/29/18.  Labs:  LDH trend: 261>219>208>199>193>201>184  INR trend:  2.5>2.18>1.92>1.34>1.35>1.35>1.33  Creat trend: 1.55>2.18>2.26>1.97>2.03>1.88>2.09  Blood products this admission: 09/19/18- 2 units FFP  Anticoagulation Plan: - INR Goal: 2.0 - 2.5 (re-started coumadin 09/23/18 post surgery) - ASA Dose: no ASA (removed 02/2017 due to GI bleed) - Heparin gtt per pharmacy  Device: -Dual Medtronic ICD -Therapies: on at 200 bpm - Last device check 10/02/17  Significant Events on VAD Support:  09/01/14>>ureteral stone with cystoscopy and flexible ureteroscopy with lithotripsy and basket extraction 02/2015 >>GIB with 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 Started on Plavix 3/17>>Lumbar fusion L3-L5 9/17>>VT with DC-CV; amio increased 10/17>>ramp with RV failure; speed decreased to 8800 02/2017> GIB (removed ASA, scopes  negative) 03/2017>percutaneous lead repair after pump stops  10/18>>Slow VT. Cardioverted in ED and amiodarone uptitrated 12/18>>melena with scope. Hgb 6.5. EGD with 2 AVMs with APC 12/18>>recurrent hematuria. 6 mm left ureteral stone 1/19>>7 mm left ureteral stone removal with laser lithotripsy; left ureteral stent. Resection of 6 cm bladder tumor    Plan/Recommendations:  1. Please call VAD pager if any issues with VAD equipment or questions, concerns.   Tanda Rockers RN [place] Coordinator  Office: [phone]  24/7 Pager: [phone]

## 2018-09-25 NOTE — Progress Notes (Signed)
[place]         [phone] ________________________________  Initial inpatient Consultation  Name: LADD CEN MRN: [id number]  Date: 09/25/2018  DOB: [birth year]  REFERRING PHYSICIAN: Bensimhon, Shaune Pascal, MD, Dr. Link Snuffer  DIAGNOSIS: 77 y/o male with persistent gross hematuria secondary to muscle invasive bladder cancer    ICD-10-CM   1. Malignant neoplasm of urinary bladder, unspecified site ([place]) C67.9   2. Hematuria, gross R31.0     HISTORY OF PRESENT ILLNESS::Ladarryl T Haas is a 77 y.o. male, established urology patient with known history of urolithiasis, currently under the care of Dr. Gloriann Loan for bladder cancer, initially diagnosed in 12/04/2017. Final pathology from a TURBT procedure on 12/04/2017 showed pT1, high grade urothelial cell carcinoma. He initially declined BCG bladder installations as recommended by Dr. Gloriann Loan and instead opted for surveillance bladder cystoscopy in 3 months.  He later decided to start BCG treatments and had his first instillation in March 2019 but did not tolerate it well due to flulike symptoms and intermittent gross hematuria so he declined further treatments.  Repeat cystoscopy was performed on 04/10/2018 revealing a recurrent 2 cm bladder tumor on the posterior bladder wall as well as a small tumor at the trigone.  He underwent repeat TURBT on 05/16/18, with final pathology revealing recurrent noninvasive, high grade urothelial cell carcinoma. He elected to continue under active surveillance with cystoscopies every 3 months. He most recently presented back to Dr. Gloriann Loan on 08/28/18 for cystoscopy, and this showed multiple recurrent bladder tumors, with the largest measuring approximately 2 cm on the right lateral wall.  He has an LVAD device and therefore was admitted to [facility] on 09/17/2018 for heparin bridging prior to a scheduled repeat TURBT on 09/19/18.  Final pathology revealed muscle  invasive high grace urothelial  carcinoma with invasion of muscularis propria.  Unfortunately, he has continued with persistent, severe gross hematuria since the time of his procedure requiring continuous bladder irrigation as well as manual bladder irrigation for blood clots.  Given the fact that he has an LVAD, he must remain on anticoagulation which is contributing to the persistent gross hematuria.  Therefore, we have been asked, by Dr. Gloriann Loan, to consult today to consider palliative radiation to assist with hemostasis and management of his disease.  PREVIOUS RADIATION THERAPY: No  Past Medical History:  Diagnosis Date  . Arthritis   . Automatic implantable cardioverter-defibrillator in situ    a. s/p Medtronic Evera device serial [id number]    . Bradycardia   . CAD (coronary artery disease)    a. s/p MI 1982;  s/p DES to CFX 2011;  s/p NSTEMI 4/12 in setting of VTach;  cath 7/12:  LM ok, LAD mid 30-40%, Dx's with 40%, mCFX stent patent, prox to mid RCA occluded with R to R and L to R collats.  Medical Tx was continued  . Chronic systolic heart failure ([place])    a. s/p LVAD 10/2013 for destination therapy  . CKD (chronic kidney disease)   . CVD (cerebrovascular disease)    a. s/p L CEA 2008  . Cytopenia   . Dysrhythmia    AFIB  . History of GI bleed    a. on ASA- started on plavix during 10/2015 admission for CVA  . HLD (hyperlipidemia)   . HTN (hypertension)   . Hypothyroidism   . Inappropriate therapy from implantable cardioverter-defibrillator    a. ATP for sinus tach SVT wavelet identified SVT but was no  passive (2014)  . Ischemic cardiomyopathy   . Melanoma of ear ([place])    a. L Ear   . Myocardial infarction ([place])    1992  . PAF (paroxysmal atrial fibrillation) ([place])   . PVD (peripheral vascular disease) ([place])    a. 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 Shortness of breath dyspnea    W/ EXERTION   . Sleep  apnea    NO CPAP NOW  HE SENT IT BACK YRS AGO  . Stroke [facility])    a. 10/2015 admission   . Ventricular tachycardia ([place])    a. s/p AICD;   h/o ICD shock;  Amiodarone Rx. S/P ablation in 2012  :   Past Surgical History:  Procedure Laterality Date  . BACK SURGERY    . CARDIAC CATHETERIZATION     "several" (05/25/2013)  . CARDIAC DEFIBRILLATOR PLACEMENT  2003; 2005; 05/25/2013   2014: Medtronic Evera device serial number [id number]  . CARDIAC ELECTROPHYSIOLOGY STUDY AND ABLATION  2012  . CARDIOVERSION N/A 10/15/2013   Procedure: CARDIOVERSION;  Surgeon: Jolaine Artist, MD;  Location: [place];  Service: Cardiovascular;  Laterality: N/A;  . CARDIOVERSION N/A 11/04/2013   Procedure: CARDIOVERSION;  Surgeon: Larey Dresser, MD;  Location: [employer];  Service: Cardiovascular;  Laterality: N/A;  . CARDIOVERSION N/A 06/04/2014   Procedure: CARDIOVERSION;  Surgeon: Jolaine Artist, MD;  Location: [facility] ENDOSCOPY;  Service: Cardiovascular;  Laterality: N/A;  . CARDIOVERSION N/A 03/02/2015   Procedure: CARDIOVERSION;  Surgeon: Jolaine Artist, MD;  Location: [facility] ENDOSCOPY;  Service: Cardiovascular;  Laterality: N/A;  . CARDIOVERSION N/A 01/19/2016   Procedure: CARDIOVERSION;  Surgeon: Larey Dresser, MD;  Location: [place];  Service: Cardiovascular;  Laterality: N/A;  . CARDIOVERSION N/A 06/10/2018   Procedure: CARDIOVERSION;  Surgeon: Jolaine Artist, MD;  Location: [facility] ENDOSCOPY;  Service: Cardiovascular;  Laterality: N/A;  . CARDIOVERSION N/A 07/02/2018   Procedure: CARDIOVERSION;  Surgeon: Jolaine Artist, MD;  Location: [place];  Service: Cardiovascular;  Laterality: N/A;  . CAROTID ENDARTERECTOMY Left ~ 2007  . CATARACT EXTRACTION W/ INTRAOCULAR LENS  IMPLANT, BILATERAL Bilateral 2000  . CHOLECYSTECTOMY  12/15/?2010  . COLONOSCOPY WITH PROPOFOL N/A 10-Jul-202018   Procedure: COLONOSCOPY WITH PROPOFOL;  Surgeon: Ronald Lobo, MD;  Location: [place];  Service: Endoscopy;   Laterality: N/A;  . CORONARY ANGIOPLASTY  1992  . CORONARY ANGIOPLASTY WITH STENT PLACEMENT     "last one was 11/2012  (05/25/2013)  . CYSTOGRAM N/A 09/19/2018   Procedure: CYSTOGRAM;  Surgeon: Lucas Mallow, MD;  Location: [place];  Service: Urology;  Laterality: N/A;  . CYSTOSCOPY W/ RETROGRADES Bilateral 05/16/2018   Procedure: CYSTOSCOPY WITH RETROGRADE PYELOGRAM;  Surgeon: Lucas Mallow, MD;  Location: [place];  Service: Urology;  Laterality: Bilateral;  . CYSTOSCOPY WITH RETROGRADE PYELOGRAM, URETEROSCOPY AND STENT PLACEMENT Left 08/09/2014   Procedure: CYSTOSCOPY WITH RETROGRADE PYELOGRAM, URETEROSCOPY AND STENT PLACEMENT;  Surgeon: Bernestine Amass, MD;  Location: [facility];  Service: Urology;  Laterality: Left;  . CYSTOSCOPY WITH RETROGRADE PYELOGRAM, URETEROSCOPY AND STENT PLACEMENT Left 09/01/2014   Procedure: CYSTOSCOPY WITH URETEROSCOPY AND STENT REMOVAL;  Surgeon: Bernestine Amass, MD;  Location: [facility];  Service: Urology;  Laterality: Left;  . CYSTOSCOPY WITH RETROGRADE PYELOGRAM, URETEROSCOPY AND STENT PLACEMENT Left 09/20/2014   Procedure: CYSTOSCOPY WITH RETROGRADE PYELOGRAM,  URETEROSCOPY AND STENT PLACEMENT, stone removal;  Surgeon: Bernestine Amass, MD;  Location: [facility];  Service: Urology;  Laterality: Left;  . CYSTOSCOPY WITH RETROGRADE PYELOGRAM, URETEROSCOPY AND STENT PLACEMENT Bilateral 12/04/2017   Procedure: CYSTOSCOPY WITH BILATERAL RETROGRADE PYELOGRAM, LEFT URETEROSCOPY AND STENT PLACEMENT;  Surgeon: Lucas Mallow, MD;  Location: [place];  Service: Urology;  Laterality: Bilateral;  . CYSTOSCOPY WITH RETROGRADE PYELOGRAM, URETEROSCOPY AND STENT PLACEMENT Left 09/19/2018   Procedure: left  RETROGRADE PYELOGRAM;  Surgeon: Lucas Mallow, MD;  Location: [place];  Service: Urology;  Laterality: Left;  . DOPPLER ECHOCARDIOGRAPHY  2011  . ELBOW ARTHROSCOPY Right 1990's  . ELECTROPHYSIOLOGIC STUDY N/A 07/19/2016   Procedure: Cardioversion;  Surgeon: Evans Lance, MD;   Location: [place] CV LAB;  Service: Cardiovascular;  Laterality: N/A;  . ENDARTERECTOMY Right 01/13/2016   Procedure: RIGHT CAROTID ARTERY ENDARTERECTOMY;  Surgeon: Mal Misty, MD;  Location: [place];  Service: Vascular;  Laterality: Right;  . ENTEROSCOPY Left 02/23/2017   Procedure: ENTEROSCOPY;  Surgeon: Ronald Lobo, MD;  Location: [facility] ENDOSCOPY;  Service: Endoscopy;  Laterality: Left;  . ENTEROSCOPY N/A 10/17/2017   Procedure: ENTEROSCOPY;  Surgeon: Yetta Flock, MD;  Location: [facility] ENDOSCOPY;  Service: Gastroenterology;  Laterality: N/A;  LVAD pt.    . ESOPHAGOGASTRODUODENOSCOPY N/A 02/15/2015   Procedure: ESOPHAGOGASTRODUODENOSCOPY (EGD);  Surgeon: Carol Ada, MD;  Location: [facility] ENDOSCOPY;  Service: Endoscopy;  Laterality: N/A;  LVAD  . EYE SURGERY     VITRECTOMY  LEFT 05/2012  . FEMORAL-POPLITEAL BYPASS GRAFT Right 2011  . FEMORAL-POPLITEAL BYPASS GRAFT Left 05/2002   Archie Endo 05/06/2002 (05/25/2013)  . HEEL SPUR SURGERY Right 1990's  . HOLMIUM LASER APPLICATION Left 40/98/1191   Procedure: HOLMIUM LASER APPLICATION;  Surgeon: Bernestine Amass, MD;  Location: [facility];  Service: Urology;  Laterality: Left;  . HOLMIUM LASER APPLICATION Left 4/78/2956   Procedure: HOLMIUM LASER APPLICATION;  Surgeon: Lucas Mallow, MD;  Location: [place];  Service: Urology;  Laterality: Left;  . IMPLANTABLE CARDIOVERTER DEFIBRILLATOR GENERATOR CHANGE N/A 05/25/2013   Procedure: IMPLANTABLE CARDIOVERTER DEFIBRILLATOR GENERATOR CHANGE;  Surgeon: Deboraha Sprang, MD;  Location: [facility] CATH LAB;  Service: Cardiovascular;  Laterality: N/A;  . INSERTION OF IMPLANTABLE LEFT VENTRICULAR ASSIST DEVICE N/A 10/19/2013   Procedure: INSERTION OF IMPLANTABLE LEFT VENTRICULAR ASSIST DEVICE;  Surgeon: Gaye Pollack, MD;  Location: [place];  Service: Open Heart Surgery;  Laterality: N/A;  CIRC ARREST  NITRIC OXIDE  MEDTRONIC ICD  . INTRAOPERATIVE TRANSESOPHAGEAL ECHOCARDIOGRAM N/A 10/19/2013   Procedure: INTRAOPERATIVE  TRANSESOPHAGEAL ECHOCARDIOGRAM;  Surgeon: Gaye Pollack, MD;  Location: [facility] OR;  Service: Open Heart Surgery;  Laterality: N/A;  . IR FLUORO GUIDE CV LINE RIGHT  12/10/2017  . IR US GUIDE VASC ACCESS RIGHT  12/10/2017  . KNEE ARTHROSCOPY Bilateral 1990's  . LEAD REVISION N/A 06/05/2013   Procedure: LEAD REVISION;  Surgeon: Thompson Grayer, MD;  Location: [facility] CATH LAB;  Service: Cardiovascular;  Laterality: N/A;  . LEFT HEART CATHETERIZATION WITH CORONARY ANGIOGRAM N/A 11/24/2012   Procedure: LEFT HEART CATHETERIZATION WITH CORONARY ANGIOGRAM;  Surgeon: Peter M Martinique, MD;  Location: [facility] CATH LAB;  Service: Cardiovascular;  Laterality: N/A;  . LIPOMA EXCISION Left 2013   "near ear" (05/25/2013)  . [place]; 2000's  . PATCH ANGIOPLASTY Right 01/13/2016   Procedure: WITH  PATCH ANGIOPLASTY;  Surgeon: Mal Misty, MD;  Location: [place];  Service: Vascular;  Laterality: Right;  .  RENAL ARTERY BYPASS Bilateral 2003  . REVASCULARIZATION / IN-SITU GRAFT LEG    . RIGHT HEART CATH N/A 11/29/2017   Procedure: RIGHT HEART CATH;  Surgeon: Jolaine Artist, MD;  Location: [place] CV LAB;  Service: Cardiovascular;  Laterality: N/A;  . RIGHT HEART CATHETERIZATION N/A 09/17/2013   Procedure: RIGHT HEART CATH;  Surgeon: Jolaine Artist, MD;  Location: [facility] CATH LAB;  Service: Cardiovascular;  Laterality: N/A;  . RIGHT HEART CATHETERIZATION N/A 10/14/2013   Procedure: RIGHT HEART CATH;  Surgeon: Jolaine Artist, MD;  Location: [facility] CATH LAB;  Service: Cardiovascular;  Laterality: N/A;  . RIGHT HEART CATHETERIZATION N/A 11/16/2013   Procedure: RIGHT HEART CATH;  Surgeon: Jolaine Artist, MD;  Location: [facility] CATH LAB;  Service: Cardiovascular;  Laterality: N/A;  . RIGHT HEART CATHETERIZATION N/A 07/13/2014   Procedure: RIGHT HEART CATH;  Surgeon: Jolaine Artist, MD;  Location: [facility] CATH LAB;  Service: Cardiovascular;  Laterality: N/A;  . TEE WITHOUT CARDIOVERSION N/A 11/04/2013   Procedure:  TRANSESOPHAGEAL ECHOCARDIOGRAM (TEE);  Surgeon: Larey Dresser, MD;  Location: [place];  Service: Cardiovascular;  Laterality: N/A;  . TEE WITHOUT CARDIOVERSION N/A 03/02/2015   Procedure: TRANSESOPHAGEAL ECHOCARDIOGRAM (TEE);  Surgeon: Jolaine Artist, MD;  Location: [facility] ENDOSCOPY;  Service: Cardiovascular;  Laterality: N/A;  . TEE WITHOUT CARDIOVERSION N/A 01/19/2016   Procedure: TRANSESOPHAGEAL ECHOCARDIOGRAM (TEE);  Surgeon: Larey Dresser, MD;  Location: [place];  Service: Cardiovascular;  Laterality: N/A;  . TONSILLECTOMY  1946  . TRANSURETHRAL RESECTION OF BLADDER TUMOR N/A 12/04/2017   Procedure: TRANSURETHRAL RESECTION OF BLADDER TUMOR (TURBT);  Surgeon: Lucas Mallow, MD;  Location: [place];  Service: Urology;  Laterality: N/A;  . TRANSURETHRAL RESECTION OF BLADDER TUMOR N/A 05/16/2018   Procedure: TRANSURETHRAL RESECTION OF BLADDER TUMOR (TURBT);  Surgeon: Lucas Mallow, MD;  Location: [place];  Service: Urology;  Laterality: N/A;  . TRANSURETHRAL RESECTION OF BLADDER TUMOR WITH MITOMYCIN-C N/A 09/19/2018   Procedure: TRANSURETHRAL RESECTION OF BLADDER TUMOR;  Surgeon: Lucas Mallow, MD;  Location: [place];  Service: Urology;  Laterality: N/A;  . TUMOR EXCISION Left 1960's   "fatty tumor" (05/25/2013)  . V-TACH ABLATION N/A 09/12/2011   Procedure: V-TACH ABLATION;  Surgeon: Evans Lance, MD;  Location: [facility] CATH LAB;  Service: Cardiovascular;  Laterality: N/A;  . V-TACH ABLATION N/A 06/08/2013   Procedure: V-TACH ABLATION;  Surgeon: Evans Lance, MD;  Location: [facility] CATH LAB;  Service: Cardiovascular;  Laterality: N/A;  FaNo current facility-administered medications for this encounter.  No current outpatient medications on file.  Facility-Administered Medications Ordered in Other Encounters:  .  0.9 %  sodium chloride infusion, , Intravenous, Continuous, Oleta Mouse, MD, Last Rate: 10 mL/hr at 09/21/18 1830 .  acetaminophen (TYLENOL) tablet 650 mg, 650 mg,  Oral, Q4H PRN, Georgiana Shore, NP .  amiodarone (PACERONE) tablet 200 mg, 200 mg, Oral, BID, Georgiana Shore, NP, 200 mg at 09/25/18 2106 .  atorvastatin (LIPITOR) tablet 80 mg, 80 mg, Oral, QHS, Georgiana Shore, NP, 80 mg at 09/25/18 2106 .  cefTRIAXone (ROCEPHIN) 1 g in sodium chloride 0.9 % 100 mL IVPB, 1 g, Intravenous, Q24H, Lore, Melissa A, RPH, Last Rate: 200 mL/hr at 09/25/18 1201, 1 g at 09/25/18 1201 .  docusate sodium (COLACE) capsule 200 mg, 200 mg, Oral, Daily, Georgiana Shore, NP, 200 mg at 09/23/18 1028 .  iothalamate meglumine (CYSTO-CONRAY II) 17.2 % solution 250 mL, 250 mL,  Intravesical, Once PRN, Marton Redwood III, MD .  lactose free nutrition (BOOST PLUS) liquid 237 mL, 237 mL, Oral, BID BM, Bensimhon, Shaune Pascal, MD, 237 mL at 09/24/18 0941 .  levothyroxine (SYNTHROID, LEVOTHROID) tablet 150 mcg, 150 mcg, Oral, QAC breakfast, Georgiana Shore, NP, 150 mcg at 09/25/18 0830 .  multivitamin with minerals tablet 1 tablet, 1 tablet, Oral, Daily, Georgiana Shore, NP, 1 tablet at 09/25/18 0830 .  pantoprazole (PROTONIX) EC tablet 40 mg, 40 mg, Oral, BID AC, Georgiana Shore, NP, 40 mg at 09/25/18 1715 .  promethazine (PHENERGAN) tablet 12.5 mg, 12.5 mg, Oral, Q6H PRN, Georgiana Shore, NP .  sodium chloride flush (NS) 0.9 % injection 10-40 mL, 10-40 mL, Intracatheter, Q12H, Bensimhon, Shaune Pascal, MD, 10 mL at 09/25/18 2109 .  sodium chloride flush (NS) 0.9 % injection 10-40 mL, 10-40 mL, Intracatheter, PRN, Bensimhon, Shaune Pascal, MD .  sotalol (BETAPACE) tablet 80 mg, 80 mg, Oral, BID, Georgiana Shore, NP, 80 mg at 09/25/18 2106 .  temazepam (RESTORIL) capsule 30 mg, 30 mg, Oral, QHS PRN, Georgiana Shore, NP, 30 mg at 09/25/18 2106 .  Warfarin - Pharmacist Dosing Inpatient, , Does not apply, q1800, Wilburn Cornelia, RPH, 1 each at 09/25/18 1735 .  zinc sulfate capsule 220 mg, 220 mg, Oral, Daily, Bensimhon, Shaune Pascal, MD, 220 mg at 09/25/18 0830 Allergies  Allergen Reactions  . Demerol  [Meperidine] Other (See Comments)    Paralysis/ Could only move eyes   Family History  Problem Relation Age of Onset  . Heart attack Mother   . Coronary artery disease Mother   . Cancer Mother   . Heart disease Mother   . Hyperlipidemia Mother   . Hypertension Mother   . Coronary artery disease Father   . Stroke Father   . Cancer Father   . Heart disease Father        before age 35  . Hyperlipidemia Father   . Hypertension Father   . Heart attack Father   . Coronary artery disease Unknown    REVIEW OF SYSTEMS:  On review of systems, the patient reports that he is doing well overall. He denies any chest pain, shortness of breath, cough, fevers, chills, night sweats, unintended weight changes. He denies any bowel or bladder disturbances, and denies abdominal pain, nausea or vomiting. He denies any new musculoskeletal or joint aches or pains. A complete review of systems is obtained and is otherwise negative.  PHYSICAL EXAM: In general this is a well appearing Caucasian male in no acute distress.  He is alert and oriented x4 and appropriate throughout the examination. HEENT reveals that the patient is normocephalic, atraumatic. EOMs are intact. PERRLA. Skin is intact without any evidence of gross lesions. Cardiovascular exam reveals a regular rate and rhythm, no clicks rubs or murmurs are auscultated. Chest is clear to auscultation bilaterally. Lymphatic assessment is performed and does not reveal any adenopathy in the cervical, supraclavicular, axillary, or inguinal chains. Abdomen has active bowel sounds in all quadrants and is intact.  An indwelling, 3-way Foley catheter attached to continuous bladder irrigation is intact and draining appropriately with thin red, urine in bag without clots.  The abdomen is soft, non tender, non distended. Lower extremities are negative for pretibial pitting edema, deep calf tenderness, cyanosis or clubbing.  KPS = 80  100 - Normal; no complaints; no  evidence of disease. 90   - Able to carry on normal activity; minor signs  or symptoms of disease. 80   - Normal activity with effort; some signs or symptoms of disease. 70   - Cares for self; unable to carry on normal activity or to do active work. 60   - Requires occasional assistance, but is able to care for most of his personal needs. 50   - Requires considerable assistance and frequent medical care. 86   - Disabled; requires special care and assistance. 51   - Severely disabled; hospital admission is indicated although death not imminent. 87   - Very sick; hospital admission necessary; active supportive treatment necessary. 10   - Moribund; fatal processes progressing rapidly. 0     - Dead  Karnofsky DA, [place], Craver LS and [facility] [phone]) The use of the nitrogen mustards in the palliative treatment of carcinoma: with particular reference to bronchogenic carcinoma Cancer 1 [id number]   LABORATORY DATA:  Lab Results  Component Value Date   WBC 6.8 09/26/2018   HGB 8.5 (L) 09/26/2018   HCT 27.1 (L) 09/26/2018   MCV 96.1 09/26/2018   PLT 122 (L) 09/26/2018   Lab Results  Component Value Date   NA 138 09/26/2018   K 4.8 09/26/2018   CL 110 09/26/2018   CO2 24 09/26/2018   Lab Results  Component Value Date   ALT 33 09/17/2018   AST 36 09/17/2018   ALKPHOS 172 (H) 09/17/2018   BILITOT 0.6 09/17/2018     RADIOGRAPHY: Ct Abdomen Pelvis Wo Contrast  Result Date: 09/22/2018 CLINICAL DATA:  77 year old male or bladder cancer staging. EXAM: CT ABDOMEN AND PELVIS WITHOUT CONTRAST TECHNIQUE: Multidetector CT imaging of the abdomen and pelvis was performed following the standard protocol without IV contrast, which could not be administered due to renal insufficiency. COMPARISON:  12/14/2017 and prior CTs FINDINGS: Please note that parenchymal abnormalities may be missed without intravenous contrast. Lower chest: Cardiomegaly, cardiac surgical changes, pacemaker lead and LEFT  ventricular assist device again identified. Emphysema again noted. Hepatobiliary: The liver is unremarkable. Patient is status post cholecystectomy. No biliary dilatation. Pancreas: Unremarkable Spleen: Unremarkable Adrenals/Urinary Tract: New moderate to severe RIGHT hydroureteronephrosis to the bladder is identified. Mild to moderate LEFT hydroureteronephrosis to the bladder is unchanged. Foley catheter within the bladder is again identified. Low-density renal lesions/cysts are unchanged. The adrenal glands are unremarkable. Stomach/Bowel: Stomach is within normal limits. No evidence of bowel wall thickening, distention, or inflammatory changes. Vascular/Lymphatic: Aortoiliac repair/grafts noted. Aortic atherosclerotic calcifications noted. No enlarged lymph nodes. Reproductive: No definite prostate abnormality. Other: Presacral/precoccygeal stranding/edema again noted. No ascites, focal collection or pneumoperitoneum. Musculoskeletal: No acute or suspicious bony abnormalities. L4 compression fracture and LOWER lumbar fusion hardware again noted. IMPRESSION: 1. New moderate to severe RIGHT hydroureteronephrosis to the bladder without obstructing cause identified on this study. 2. Unchanged mild to moderate LEFT hydroureteronephrosis to the bladder. Foley catheter within the bladder again identified. 3. No suspicious bony abnormalities or evidence of metastatic disease on this study. 4. Cardiomegaly and LEFT ventricular assist device 5. Aortic Atherosclerosis (ICD10-I70.0) and Emphysema (ICD10-J43.9). Electronically Signed   By: Margarette Canada M.D.   On: 09/22/2018 13:00   Dg Retrograde Pyelogram  Result Date: 09/19/2018 CLINICAL DATA:  Left retrograde pyelogram EXAM: RETROGRADE PYELOGRAM COMPARISON:  05/16/2018 FINDINGS: Spot fluoroscopic imaging performed during the operative procedure. Retrograde left ureteral injection performed. Left ureter and collecting system appear patent. No obstruction, significant  strictures, or filling defect. No hydronephrosis. Bifid left renal pelvis present. IMPRESSION: Patent left ureter without obstruction or hydronephrosis.  Electronically Signed   By: Jerilynn Mages.  Shick M.D.   On: 09/19/2018 15:08   US Renal  Result Date: 09/20/2018 CLINICAL DATA:  Recent bladder tumor debulking, evaluate for possible hydronephrosis EXAM: RENAL / URINARY TRACT ULTRASOUND COMPLETE COMPARISON:  09/18/2018 FINDINGS: Right Kidney: Renal measurements: 10.9 x 7.0 x 8.0 cm = volume: 319 mL. Moderate hydronephrosis is noted. Large renal cyst is noted measuring 7.1 cm. Left Kidney: Renal measurements: 8.1 x 4.5 x 6.7 = volume: 128 mL. Mild left hydronephrosis is noted less than that seen on the right. Two cysts are noted on the left. The largest of these measures 3.4 cm. Bladder: Decompressed by Foley catheter. IMPRESSION: Bilateral hydronephrosis right greater than left the right-sided hydronephrosis is slightly improved from the prior exam. The left hydronephrosis is increased from the prior study. Stable renal cysts. Electronically Signed   By: Inez Catalina M.D.   On: 09/20/2018 13:24   US Renal  Result Date: 09/18/2018 CLINICAL DATA:  77 year old male with acute kidney injury. History of bladder cancer with TURBT. EXAM: RENAL / URINARY TRACT ULTRASOUND COMPLETE COMPARISON:  05/19/2018 ultrasound and 12/14/2017 CT FINDINGS: Right Kidney: Renal measurements: 10.3 x 5.3 x 5.7 cm = volume: 169 mL. Moderate to severe RIGHT hydronephrosis now identified. RIGHT renal cysts are again noted, the largest measuring 7.4 cm. Echogenicity within normal limits. Left Kidney: Renal measurements: 11.8 x 6.5 x 5.9 cm = volume: 235 mL. Echogenicity within normal limits. LEFT renal cysts are again noted. No hydronephrosis identified. Bladder: New bladder wall nodules/masses are noted, some which contain internal vascular flow and compatible with malignancy. The largest nodule measures 2.4 cm anteriorly. Prostate enlargement is  present. IMPRESSION: 1. Bladder wall masses/nodules with vascular flow now noted compatible with malignancy. The largest bladder wall mass measures 2.4 cm anteriorly. 2. New moderate to severe RIGHT hydronephrosis. Electronically Signed   By: Margarette Canada M.D.   On: 09/18/2018 13:07   Dg Chest Port 1 View  Result Date: 09/17/2018 CLINICAL DATA:  Assess PICC line placement EXAM: PORTABLE CHEST 1 VIEW COMPARISON:  May 18, 2018 FINDINGS: No pneumothorax. A new right PICC line terminates in the central SVC. The LVAD is stable. The AICD device is stable. Mild pulmonary venous congestion, improved. No other interval changes or acute abnormalities. IMPRESSION: The new right PICC line is in good position. No other significant change. Electronically Signed   By: Dorise Bullion III M.D   On: 09/17/2018 22:15   [place] Ekg Site Rite  Result Date: 09/17/2018 If Site Rite image not attached, placement could not be confirmed due to current cardiac rhythm.     IMPRESSION/PLAN: 77 y/o male with persistent gross hematuria secondary to muscle invasive bladder cancer. The patient's case has been personally reviewed by Dr. Tammi Klippel.  Today, I talked to the patient about the findings and work-up thus far.  We discussed the natural history of muscle invasive bladder cancer and general treatment, highlighting the role of palliative radiotherapy in the management of persistent gross hematuria.  We discussed the available radiation techniques, and focused on the details of logistics and delivery.  The recommendation is to proceed with a 10-day course of palliative radiotherapy to the bladder delivered over a period of 2 weeks.  We reviewed the anticipated acute and late sequelae associated with radiation in this setting.  The patient was encouraged to ask questions that were answered to his stated satisfaction.  He appears to have a good understanding of his overall disease and our treatment  recommendations and would like to  proceed with palliative radiation for management of the gross hematuria.  He has freely signed written consent to proceed today in the office and will undergo CT simulation today in preparation for beginning treatments on Monday, 09/29/2018.  While he remains in-house at [facility], he will need arrangements made for [facility] transportation to and from his radiation treatments as well as LVAD monitoring during his treatments.    Nicholos Johns, PA-C    [name] Pita, MD  [facility] Direct Dial: [phone]  Fax: [phone] [email or web address]  Skype  [email or web address]

## 2018-09-25 NOTE — Progress Notes (Signed)
   Pre-Radiation Transport Note:  IP nurse called: Arbie Cookey, RN Time:  09/25/2018,3:28 PM  IV location:  PICC line and peripheral IV IV fluids: Normal saline KVO  Last dose of pain medication given (name/time):  Transport is for tomorrow. Explained if patient is in pain to please pre medicate before transport.   Patient has an LVAD. Per Arbie Cookey, RN patient is A&O x3 and requires no oxygen therapy.   Provided Arbie Cookey, RN with [facility]'s number and requested she call reports. She verbalized understanding.   Spoke with Marden Noble at [facility] and requested patient be transported from bed 1, 2 central at [facility] to radiation oncology by 0930 on 11/22.   Joaquim Lai, RN 09/25/2018,3:28 PM

## 2018-09-25 NOTE — Progress Notes (Signed)
ANTICOAGULATION CONSULT NOTE - Follow Up Consult  Pharmacy Consult for warfarin Indication: LVAD  Allergies  Allergen Reactions  . Demerol [Meperidine] Other (See Comments)    Paralysis/ Could only move eyes    Patient Measurements: Height: 6' 0.01" (182.9 cm) Weight: 168 lb 3.4 oz (76.3 kg) IBW/kg (Calculated) : 77.62 Heparin Dosing Weight: 77kg  Vital Signs: Temp: 97.8 F (36.6 C) (11/21 1518) Temp Source: Oral (11/21 1518) BP: 100/79 (11/21 1518) Pulse Rate: 75 (11/21 1518)  Labs: Recent Labs    09/23/18 0305  09/23/18 0306 09/24/18 0205 09/25/18 0454  HGB  --    < > 9.5* 9.2* 8.7*  HCT  --   --  30.6* 30.8* 29.3*  PLT  --   --  128* 140* 133*  LABPROT  --   --  16.6* 16.4* 16.3*  INR  --   --  1.35 1.33 1.33  HEPARINUNFRC <0.10*  --   --   --   --   CREATININE  --   --  2.03* 1.88* 2.09*   < > = values in this interval not displayed.    Estimated Creatinine Clearance: 31.4 mL/min (A) (by C-G formula based on SCr of 2.09 mg/dL (H)).   Medical History: Past Medical History:  Diagnosis Date  . Arthritis   . Automatic implantable cardioverter-defibrillator in situ    a. s/p Medtronic Evera device serial [id number]    . Bradycardia   . CAD (coronary artery disease)    a. s/p MI 1982;  s/p DES to CFX 2011;  s/p NSTEMI 4/12 in setting of VTach;  cath 7/12:  LM ok, LAD mid 30-40%, Dx's with 40%, mCFX stent patent, prox to mid RCA occluded with R to R and L to R collats.  Medical Tx was continued  . Chronic systolic heart failure ([place])    a. s/p LVAD 10/2013 for destination therapy  . CKD (chronic kidney disease)   . CVD (cerebrovascular disease)    a. s/p L CEA 2008  . Cytopenia   . Dysrhythmia    AFIB  . History of GI bleed    a. on ASA- started on plavix during 10/2015 admission for CVA  . HLD (hyperlipidemia)   . HTN (hypertension)   . Hypothyroidism   . Inappropriate therapy from implantable cardioverter-defibrillator    a. ATP for sinus tach SVT  wavelet identified SVT but was no  passive (2014)  . Ischemic cardiomyopathy   . 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 L Ear   . Myocardial infarction ([place])    1992  . PAF (paroxysmal atrial fibrillation) ([place])   . PVD (peripheral vascular disease) ([place])    a. 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 Shortness of breath dyspnea    W/ EXERTION   . Sleep apnea    NO CPAP NOW  HE SENT IT BACK YRS AGO  . Stroke [facility])    a. 10/2015 admission   . Ventricular tachycardia ([place])    a. s/p AICD;   h/o ICD shock;  Amiodarone Rx. S/P ablation in 2012   Assessment: 77 year old male with history of LVAD implant being admitted for hold of warfarin and heparin bridge in anticipation of bladder tumor resection on 11/15. PTA warfarin dose is 2 mg daily. Pt did receive vitamin K 2.5 mg  po x1 on 11/14 and + 2 units of FFP on 11/15 + increased home Boost TID- which has some vitamin K.   INR unchanged at 1.33 today after restarting warfarin 11/19. Continues to have hematuria. Hemoglobin down to 8.7, getting 1 unit pRBC. Will continue to dose warfarin more conservatively given ongoing hematuria. Heparin remains off.  Goal of Therapy:  INR 2.0-2.5 Heparin level 0.3-0.5 units/ml Monitor platelets by anticoagulation protocol: Yes   Plan:  Warfarin 2 mg PO x1 tonight Daily INR Monitor resolution of hematuria  Jackson Latino, PharmD PGY1 Pharmacy Resident Phone [phone] 09/25/2018     3:49 PM

## 2018-09-25 NOTE — Progress Notes (Signed)
Urology Inpatient Progress Report  LVAD BLADDER CANCER  Procedure(s): TRANSURETHRAL RESECTION OF BLADDER TUMOR left  RETROGRADE PYELOGRAM CYSTOGRAM  6 Days Post-Op   Intv/Subj: Hematuria has continued despite continuous bladder irrigation.  This is prompted a formal consultation with radiation oncology to see if we can start palliative radiation for his hematuria.  He did have some symptomatic anemia with severe fatigue.  He therefore received 1 unit of PRBC.  His creatinine increased a small amount to 2.09 but overall this is stable.  He currently has no complaints.  Nursing has been having to intermittently irrigate the bladder to clear some blood clots.  Principal Problem:   Bladder cancer [facility]) Active Problems:   Atrial tachycardia (HCC)   LVAD (left ventricular assist device) present ([place])   AKI (acute kidney injury) ([place])   Hydronephrosis of right kidney  Current Facility-Administered Medications  Medication Dose Route Frequency Provider Last Rate Last Dose  . 0.9 %  sodium chloride infusion   Intravenous Continuous Oleta Mouse, MD 10 mL/hr at 09/21/18 1830    . acetaminophen (TYLENOL) tablet 650 mg  650 mg Oral Q4H PRN Georgiana Shore, NP      . amiodarone (PACERONE) tablet 200 mg  200 mg Oral BID Georgiana Shore, NP   200 mg at 09/25/18 0830  . atorvastatin (LIPITOR) tablet 80 mg  80 mg Oral QHS Georgiana Shore, NP   80 mg at 09/24/18 2103  . cefTRIAXone (ROCEPHIN) 1 g in sodium chloride 0.9 % 100 mL IVPB  1 g Intravenous Q24H Lore, Melissa A, RPH 200 mL/hr at 09/25/18 1201 1 g at 09/25/18 1201  . docusate sodium (COLACE) capsule 200 mg  200 mg Oral Daily Georgiana Shore, NP   200 mg at 09/23/18 1028  . iothalamate meglumine (CYSTO-CONRAY II) 17.2 % solution 250 mL  250 mL Intravesical Once PRN Marton Redwood III, MD      . lactose free nutrition (BOOST PLUS) liquid 237 mL  237 mL Oral BID BM Bensimhon, Shaune Pascal, MD   237 mL at 09/24/18 0941  . levothyroxine  (SYNTHROID, LEVOTHROID) tablet 150 mcg  150 mcg Oral QAC breakfast Georgiana Shore, NP   150 mcg at 09/25/18 0830  . multivitamin with minerals tablet 1 tablet  1 tablet Oral Daily Georgiana Shore, NP   1 tablet at 09/25/18 0830  . pantoprazole (PROTONIX) EC tablet 40 mg  40 mg Oral BID AC Georgiana Shore, NP   40 mg at 09/25/18 1715  . promethazine (PHENERGAN) tablet 12.5 mg  12.5 mg Oral Q6H PRN Georgiana Shore, NP      . sodium chloride flush (NS) 0.9 % injection 10-40 mL  10-40 mL Intracatheter Q12H Bensimhon, Shaune Pascal, MD   30 mL at 09/25/18 0833  . sodium chloride flush (NS) 0.9 % injection 10-40 mL  10-40 mL Intracatheter PRN Bensimhon, Shaune Pascal, MD      . sotalol (BETAPACE) tablet 80 mg  80 mg Oral BID Georgiana Shore, NP   80 mg at 09/25/18 0831  . temazepam (RESTORIL) capsule 30 mg  30 mg Oral QHS PRN Georgiana Shore, NP   30 mg at 09/24/18 2102  . Warfarin - Pharmacist Dosing Inpatient   Does not apply q1800 Wilburn Cornelia, RPH   1 each at 09/25/18 1735  . zinc sulfate capsule 220 mg  220 mg Oral Daily Bensimhon, Shaune Pascal, MD   220 mg at 09/25/18 0830  Objective: Vital: Vitals:   09/25/18 1310 09/25/18 1518 09/25/18 1637 09/25/18 1704  BP: 97/81 100/79 107/90 101/85  Pulse: 74 75 75 75  Resp: 16 (!) 22 16 19   Temp: 97.7 F (36.5 C) 97.8 F (36.6 C) 98 F (36.7 C) 98.2 F (36.8 C)  TempSrc: Oral Oral Oral Oral  SpO2: 95% 98% 96% 95%  Weight:      Height:       I/Os: I/O last 3 completed shifts: In: 41 [P.O.:640; I.V.:444; Other:3000] Out: 1451 [Urine:1450; Stool:1]  Physical Exam:  General: Patient is in no apparent distress Lungs: Normal respiratory effort, chest expands symmetrically. GI: The abdomen is soft and nontender without mass. Foley: Three-way Foley catheter in place draining clear on fast drip CBI Ext: lower extremities symmetric  Lab Results: Recent Labs    09/23/18 0306 09/24/18 0205 09/25/18 0454  WBC 7.0 7.0 7.9  HGB 9.5* 9.2* 8.7*   HCT 30.6* 30.8* 29.3*   Recent Labs    09/23/18 0306 09/24/18 0205 09/25/18 0454  NA 136 134* 135  K 4.8 4.7 4.2  CL 106 104 104  CO2 25 25 23   GLUCOSE 101* 111* 146*  BUN 42* 37* 32*  CREATININE 2.03* 1.88* 2.09*  CALCIUM 8.6* 8.8* 8.6*   Recent Labs    09/23/18 0306 09/24/18 0205 09/25/18 0454  INR 1.35 1.33 1.33   No results for input(s): LABURIN in the last 72 hours. Results for orders placed or performed during the hospital encounter of 09/17/18  MRSA PCR Screening     Status: None   Collection Time: 09/17/18  1:36 PM  Result Value Ref Range Status   MRSA by PCR NEGATIVE NEGATIVE Final    Comment:        The GeneXpert MRSA Assay (FDA approved for NASAL specimens only), is one component of a comprehensive MRSA colonization surveillance program. It is not intended to diagnose MRSA infection nor to guide or monitor treatment for MRSA infections. Performed at [facility], [place] [address], [place], [place] [address]    *Note: Due to a large number of results and/or encounters for the requested time period, some results have not been displayed. A complete set of results can be found in Results Review.    Studies/Results: No results found.  Assessment: Muscle invasive bladder cancer Persistent gross hematuria Bilateral hydronephrosis  Procedure(s): TRANSURETHRAL RESECTION OF BLADDER TUMOR left  RETROGRADE PYELOGRAM CYSTOGRAM, 6 Days Post-Op  doing well.  Plan: Agree with palliative radiation in an attempt to stop bleeding considering he has muscle invasive disease and conservative measures are not currently stopping the bleeding.  For now, continue continuous bladder irrigation with manual irrigation as needed.  Blood transfusions as needed.  Observe hydronephrosis given overall stable creatinine.   Link Snuffer, MD Urology 09/25/2018, 5:42 PM '

## 2018-09-25 NOTE — Progress Notes (Signed)
BP 59/34 (44) with manual bp of 54.  Flow of 3.2 and PI 3.  Sarah VAD coordinator paged.  Orders received and carried out.  Will continue to monitor pt closely.

## 2018-09-25 NOTE — Progress Notes (Signed)
Advanced Heart Failure VAD Team Note  PCP-Cardiologist: No primary care provider on file.   Subjective:    Admitted for resection of recurrent bladder tumor. Renal u/s obtained and shows recurrent R hydronephrosis without BOO. There is evidence of recurrent bladder tumor as well.  Ramp echo 11/14 speed turned down to 8600 Went to OR 11/15 for bladder tumor resection. Multiple large tumors. Unable to uncover R ureteral orifice.  Repeat renal u/s showed moderate R hydronephrosis (slightly improved) and new left mild hydro.  Has 3-way Foley in place for bladder irrigation. Heparin stopped. Passing lots of clots with hematuria. Feels weak. No SOB. No pain. Numerous low flw alarms on VAD   LVAD Interrogation HM 2: Speed: 8600 Flow: 3.0 PI:4.8 Power: 4.0 18 low flow alarms this am    Objective:    Vital Signs:   Temp:  [97.5 F (36.4 C)-98.6 F (37 C)] 97.9 F (36.6 C) (11/21 1240) Pulse Rate:  [68-97] 75 (11/21 1240) Resp:  [14-26] 16 (11/21 1255) BP: (60-143)/(47-74) 94/72 (11/21 1240) SpO2:  [94 %-100 %] 95 % (11/21 1240) Weight:  [76.3 kg] 76.3 kg (11/21 0402) Last BM Date: 09/24/18 Mean arterial Pressure 70s  Intake/Output:   Intake/Output Summary (Last 24 hours) at 09/25/2018 1440 Last data filed at 09/25/2018 1300 Gross per 24 hour  Intake 6850 ml  Output 0 ml  Net 6850 ml     Physical Exam   Physical Exam: General: In bed. Pale. NAD  HEENT: normal  Neck: supple. JVP not elevated.  Carotids 2+ bilat; no bruits. No lymphadenopathy or thryomegaly appreciated. Cor: LVAD hum.  Lungs: Clear. Abdomen: obese soft, nontender, non-distended. No hepatosplenomegaly. No bruits or masses. Good bowel sounds. Driveline site clean. Anchor in place.  Extremities: no cyanosis, clubbing, rash. Warm no edema  Neuro: alert & oriented x 3. No focal deficits. Moves all 4 without problem  3-way Foley in place with hematuria   Telemetry    A paced 90s personally reviewed.     Labs   Basic Metabolic Panel: Recent Labs  Lab 09/21/18 0637 09/22/18 0439 09/23/18 0306 09/24/18 0205 09/25/18 0454  NA 137 135 136 134* 135  K 4.9 5.0 4.8 4.7 4.2  CL 107 104 106 104 104  CO2 25 26 25 25 23   GLUCOSE 94 100* 101* 111* 146*  BUN 44* 43* 42* 37* 32*  CREATININE 2.10* 1.97* 2.03* 1.88* 2.09*  CALCIUM 8.9 8.6* 8.6* 8.8* 8.6*    Liver Function Tests: No results for input(s): AST, ALT, ALKPHOS, BILITOT, PROT, ALBUMIN in the last 168 hours. No results for input(s): LIPASE, AMYLASE in the last 168 hours. No results for input(s): AMMONIA in the last 168 hours.  CBC: Recent Labs  Lab 09/21/18 0637 09/22/18 0439 09/23/18 0306 09/24/18 0205 09/25/18 0454  WBC 7.8 6.8 7.0 7.0 7.9  HGB 9.5* 9.5* 9.5* 9.2* 8.7*  HCT 31.5* 31.4* 30.6* 30.8* 29.3*  MCV 97.5 97.5 97.5 97.5 98.7  PLT 117* 128* 128* 140* 133*    INR: Recent Labs  Lab 09/21/18 0637 09/22/18 0439 09/23/18 0306 09/24/18 0205 09/25/18 0454  INR 1.24 1.34 1.35 1.33 1.33    Imaging   No results found.   Medications:     Scheduled Medications: . sodium chloride   Intravenous Once  . amiodarone  200 mg Oral BID  . atorvastatin  80 mg Oral QHS  . docusate sodium  200 mg Oral Daily  . lactose free nutrition  237 mL Oral BID BM  .  levothyroxine  150 mcg Oral QAC breakfast  . multivitamin with minerals  1 tablet Oral Daily  . pantoprazole  40 mg Oral BID AC  . sodium chloride flush  10-40 mL Intracatheter Q12H  . sotalol  80 mg Oral BID  . Warfarin - Pharmacist Dosing Inpatient   Does not apply q1800  . zinc sulfate  220 mg Oral Daily    Infusions: . sodium chloride 10 mL/hr at 09/21/18 1830  . cefTRIAXone (ROCEPHIN)  IV 1 g (09/25/18 1201)    PRN Medications: acetaminophen, iothalamate meglumine, promethazine, sodium chloride flush, temazepam   Patient Profile   [name] is a 77 y.o. male with h/o VT, PAD and severe systolic HF (EF 73-53%) due to mixed  ischemic/nonischemic CM. He is s/p HM II LVAD implant for destination therapy on October 19, 2013. VAD implantation was complicated by VT, syncope, AF/Aflutter and RHF. Required short term milrinone.   Admitted for heparin bridge for bladder tumor resection on 09/19/18.  Assessment/Plan:    1. 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  - Had 2 BCG infusions but discontinued due to inability to tolerate - 7/12 s/p TURB with right ureteral stent placement.  - Renal u/s 09/18/18 with recurrent R hydronephrosis without BOO. There is evidence of recurrent bladder tumor as well - Underwent recurrent bladder tumor resection 09/19/18 with Dr Gloriann Loan. Found to be large fast-growing tumor with obstruction of right ureteral orifice - Repeat renal u/s 09/20/18 shows moderate R hydronephrosis (slightly improved) and new left mild hydro.  - CT ab/pelvis 09/22/18 no metastatic or locally invasive disease. 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  - Foley removed 11/19 but he could not void so foley was replaced.   - Now with severe hematuria/clots. 3 way foley in place for irrigation but having lots of clots/ - Continue to hold heparin. - D/w Drs. Waldon Reining with plan XRT to help try and stop bleeding. Will have sim session. Friday am at [facility].  - transfuse 1u RBCs 2. Chronic systolic HF s/p HM-II LVAD 10/2014  - VAD interrogated personally.Volume status low with multiple low flows - Transfuse 1u RBCs. Give 250 cc NS - Hold heparin. Give low-dose coumadin  - LDH 184 3. 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 Required HD x1 - Creatininebaseline 1.4-1.5 - CR 2.16 -> 2.18 -> 2.26 -> 2.20 -> 2.10->1.97-> 2.03-->1.8-> 2.1   in setting of R> L hydronephrosis - Bladder tumor debulked but unable to find R ureteral orifice.  Discussed possibility of perc tube with Urology but no indication currently.  - [name] says he would not want nephrostomy. 4 h/o.RecurrentVTwith hypotension and low flow alarms on VAD: Underwent DC-CV 9/17, 08/17/17, and 04/01/18. S/p emergent DC-CV in ER 07/09/18  - Had several low flow alarms on 11/9, 11/4, and 10/31. ICD shows several runs of AT 10/24-10/28, all lasting about an hour. Nothing that correlates with low flow alarms.   - Quiescent VT onSotalol80mg BIDand amio - QTC slightly prolonged but stable. Previously reviewed with Dr. Caryl Comes and felt OK.  - Keep K > 4.0 Mg > 2.0. Stable  5. Atrial arrhythmias: Atrial fibrillation, atrial tachycardia. S/p DC-CV 06/04/14, DC-CV 03/02/15 and 01/19/16.  -Remains in NSR. Device interrogation this admit shows multiple brief runs atrial tach 6. H/o Recurrent GIB - Most recent bleed 12/18.  hgb 6.5, Transfused 5u. EGD with 2 AVMs treated with APC - No recent GI bleeding  I reviewed the LVAD parameters from today, and compared the results to the patient's prior recorded data.  No programming changes were made.  The LVAD is functioning within specified parameters.  The patient performs LVAD self-test daily.  LVAD interrogation was negative for any significant power changes, alarms or PI events/speed drops.  LVAD equipment check completed and is in good working order.  Back-up equipment present.   LVAD education done on emergency procedures and precautions and reviewed exit site care.   Length of Stay: [place], MD 09/25/2018, 2:40 PM  VAD Team --- VAD ISSUES ONLY--- Pager [phone] (7am - 7am)  Advanced Heart Failure Team  Pager [phone] (M-F; 7a - 4p)  Please contact [facility] for night-coverage after hours (4p -7a ) and weekends on [email or web address]

## 2018-09-25 NOTE — Plan of Care (Signed)
  Problem: Clinical Measurements: Goal: Ability to maintain clinical measurements within normal limits will improve Outcome: Progressing Goal: Will remain free from infection Outcome: Progressing Goal: Diagnostic test results will improve Outcome: Progressing Goal: Cardiovascular complication will be avoided Outcome: Progressing   Problem: Activity: Goal: Risk for activity intolerance will decrease Outcome: Progressing   Problem: Pain Managment: Goal: General experience of comfort will improve Outcome: Progressing   Problem: Safety: Goal: Ability to remain free from injury will improve Outcome: Progressing   Problem: Skin Integrity: Goal: Risk for impaired skin integrity will decrease Outcome: Progressing   Problem: Cardiac: Goal: LVAD will function as expected and patient will experience no clinical alarms Outcome: Progressing

## 2018-09-25 NOTE — Plan of Care (Signed)
  Problem: Clinical Measurements: Goal: Ability to maintain clinical measurements within normal limits will improve Outcome: Progressing Goal: Will remain free from infection Outcome: Progressing Goal: Diagnostic test results will improve Outcome: Progressing Goal: Cardiovascular complication will be avoided Outcome: Progressing   Problem: Activity: Goal: Risk for activity intolerance will decrease Outcome: Progressing   Problem: Elimination: Goal: Will not experience complications related to urinary retention Outcome: Progressing   Problem: Pain Managment: Goal: General experience of comfort will improve Outcome: Progressing   Problem: Safety: Goal: Ability to remain free from injury will improve Outcome: Progressing   Problem: Skin Integrity: Goal: Risk for impaired skin integrity will decrease Outcome: Progressing   Problem: Cardiac: Goal: LVAD will function as expected and patient will experience no clinical alarms Outcome: Progressing

## 2018-09-25 NOTE — Progress Notes (Signed)
CBI continued at moderate rate.  Pt complain of feeling like he cant void.  Manually irrigated foley for large amount of clots.  Urine remains bloody.

## 2018-09-26 ENCOUNTER — Ambulatory Visit
Admit: 2018-09-26 | Discharge: 2018-09-26 | Disposition: A | Discharge status: Home / Self Care | Payer: Medicare Other | Attending: Radiation Oncology | Admitting: Radiation Oncology

## 2018-09-26 DIAGNOSIS — R310 Gross hematuria: Secondary | ICD-10-CM | POA: Insufficient documentation

## 2018-09-26 DIAGNOSIS — N179 Acute kidney failure, unspecified: Secondary | ICD-10-CM | POA: Diagnosis not present

## 2018-09-26 DIAGNOSIS — C672 Malignant neoplasm of lateral wall of bladder: Secondary | ICD-10-CM

## 2018-09-26 DIAGNOSIS — N1330 Unspecified hydronephrosis: Secondary | ICD-10-CM | POA: Diagnosis not present

## 2018-09-26 DIAGNOSIS — C670 Malignant neoplasm of trigone of bladder: Secondary | ICD-10-CM | POA: Diagnosis not present

## 2018-09-26 DIAGNOSIS — I5022 Chronic systolic (congestive) heart failure: Secondary | ICD-10-CM | POA: Diagnosis not present

## 2018-09-26 DIAGNOSIS — C679 Malignant neoplasm of bladder, unspecified: Secondary | ICD-10-CM | POA: Diagnosis not present

## 2018-09-26 DIAGNOSIS — Z95811 Presence of heart assist device: Secondary | ICD-10-CM | POA: Diagnosis not present

## 2018-09-26 LAB — BASIC METABOLIC PANEL
ANION GAP: 4 — AB (ref 5–15)
BUN: 34 mg/dL — ABNORMAL HIGH (ref 8–23)
CALCIUM: 8.2 mg/dL — AB (ref 8.9–10.3)
CO2: 24 mmol/L (ref 22–32)
CREATININE: 1.89 mg/dL — AB (ref 0.61–1.24)
Chloride: 110 mmol/L (ref 98–111)
GFR, EST AFRICAN AMERICAN: 38 mL/min — AB (ref >60)
GFR, EST NON AFRICAN AMERICAN: 32 mL/min — AB (ref >60)
Glucose, Bld: 96 mg/dL (ref 70–99)
Potassium: 4.8 mmol/L (ref 3.5–5.1)
SODIUM: 138 mmol/L (ref 135–145)

## 2018-09-26 LAB — TYPE AND SCREEN
ABO/RH(D): O POS
ANTIBODY SCREEN: NEGATIVE
Unit division: 0

## 2018-09-26 LAB — BPAM RBC
Blood Product Expiration Date: 201912192359
ISSUE DATE / TIME: 201911211246
Unit Type and Rh: 5100

## 2018-09-26 LAB — PROTIME-INR
INR: 1.48
PROTHROMBIN TIME: 17.7 s — AB (ref 11.4–15.2)

## 2018-09-26 LAB — CBC
HCT: 27.1 % — ABNORMAL LOW (ref 39.0–52.0)
Hemoglobin: 8.5 g/dL — ABNORMAL LOW (ref 13.0–17.0)
MCH: 30.1 pg (ref 26.0–34.0)
MCHC: 31.4 g/dL (ref 30.0–36.0)
MCV: 96.1 fL (ref 80.0–100.0)
Platelets: 122 10*3/uL — ABNORMAL LOW (ref 150–400)
RBC: 2.82 MIL/uL — ABNORMAL LOW (ref 4.22–5.81)
RDW: 17.2 % — ABNORMAL HIGH (ref 11.5–15.5)
WBC: 6.8 10*3/uL (ref 4.0–10.5)
nRBC: 0 % (ref 0.0–0.2)

## 2018-09-26 LAB — LACTATE DEHYDROGENASE: LDH: 165 U/L (ref 98–192)

## 2018-09-26 MED ORDER — SODIUM CHLORIDE 0.9 % IV BOLUS
500.0000 mL | Freq: Once | INTRAVENOUS | Status: AC
  Administered 2018-09-26: 500 mL via INTRAVENOUS

## 2018-09-26 MED ORDER — WARFARIN SODIUM 2 MG PO TABS
2.0000 mg | ORAL_TABLET | Freq: Once | ORAL | Status: AC
  Administered 2018-09-26: 2 mg via ORAL
  Filled 2018-09-26: qty 1

## 2018-09-26 NOTE — Progress Notes (Signed)
LVAD Coordinator Rounding Note:  Admitted on 09/17/18 for heparin bridge for bladder tumor resection scheduled on Friday 11/15.   HM II Implanted 10/19/13 by Dr. Cyndia Bent under Destination Therapy criteria.  Pt is being transported to [facility] today for simulator radiation in prep for radiation next week.  Vital signs: Temp: 97.9 HR: 74 Doppler Pressure:90 Automatic cuff: 112/81 (92) O2 Sat: 99% on RA  Weight:  170.5>168.4>167.9>163.8>172.1>172.6>172.1>168.2>169.9 lbs  LVAD interrogation reveals:  Speed: 8600 Flow:3.8 Power: 3.9w PI: 6.1  Alarms:  none Events:5 - 10 PI events daily  Fixed speed: 8600 Low speed limit: 8000  Drive Line: Weekly dressings performed by bedside nurse or SO [name]. Next dressing change due 09/29/18.  Labs:  LDH trend: 261>219>208>199>193>201>184>165  INR trend:  2.5>2.18>1.92>1.34>1.35>1.35>1.33>1.48  Creat trend: 1.55>2.18>2.26>1.97>2.03>1.88>2.09>1.89  Blood products this admission: 09/19/18- 2 units FFP 11/21/1- 1 u/PRBC  Anticoagulation Plan: - INR Goal: 2.0 - 2.5 (re-started coumadin 09/23/18 post surgery) - ASA Dose: no ASA (removed 02/2017 due to GI bleed) - Heparin gtt per pharmacy  Device: -Dual Medtronic ICD -Therapies: on at 200 bpm - Last device check 10/02/17  Significant Events on VAD Support:  09/01/14>>ureteral stone with cystoscopy and flexible ureteroscopy with lithotripsy and basket extraction 02/2015 >>GIB with 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 Started on Plavix 3/17>>Lumbar fusion L3-L5 9/17>>VT with DC-CV; amio increased 10/17>>ramp with RV failure; speed decreased to 8800 02/2017> GIB (removed ASA, scopes negative) 03/2017>percutaneous lead repair after pump stops  10/18>>Slow VT. Cardioverted in ED and amiodarone uptitrated 12/18>>melena  with scope. Hgb 6.5. EGD with 2 AVMs with APC 12/18>>recurrent hematuria. 6 mm left ureteral stone 1/19>>7 mm left ureteral stone removal with laser lithotripsy; left ureteral stent. Resection of 6 cm bladder tumor   Pt was accompanied by VAD coordinator to [facility] for simulator radiation today. Pt tolerated transport and procedure well.    Plan/Recommendations:  1. Please call VAD pager if any issues with VAD equipment or questions, concerns. 2. Pt is schedule for radiation at [facility] next week. VAD coordinator will accompany pt to [place].   Tanda Rockers RN [place] Coordinator  Office: [phone]  24/7 Pager: [phone]

## 2018-09-26 NOTE — Progress Notes (Signed)
Advanced Heart Failure VAD Team Note  PCP-Cardiologist: No primary care provider on file.   Subjective:    Admitted for resection of recurrent bladder tumor. Renal u/s obtained and shows recurrent R hydronephrosis without BOO. There is evidence of recurrent bladder tumor as well.  Ramp echo 11/14 speed turned down to 8600 Went to OR 11/15 for bladder tumor resection. Multiple large tumors. Unable to uncover R ureteral orifice.  Repeat renal u/s showed moderate R hydronephrosis (slightly improved) and new left mild hydro.  Has 3-way Foley in place for bladder irrigation. Off heparin. Still with hematuria but clearing some. Frequent clots requiring manual irrigation.  No pain. Occasional nausea. Low flow alarms on VAD have resolved. Denies SOB or orthopnea. Went to [facility] for XRT sim.   LVAD Interrogation HM 2: Speed: 8600 Flow: 3.0 PI:4.0 Power: 4.5  Multiple PI events/ No low flows over past 24 hours    Objective:    Vital Signs:   Temp:  [97.5 F (36.4 C)-98.2 F (36.8 C)] 97.6 F (36.4 C) (11/22 1130) Pulse Rate:  [53-75] 53 (11/22 1130) Resp:  [13-22] 14 (11/22 1130) BP: (59-112)/(34-90) 95/85 (11/22 1134) SpO2:  [95 %-99 %] 97 % (11/22 1130) Weight:  [77.1 kg] 77.1 kg (11/22 0536) Last BM Date: 09/24/18 Mean arterial Pressure 70-80s  Intake/Output:   Intake/Output Summary (Last 24 hours) at 09/26/2018 1429 Last data filed at 09/26/2018 1349 Gross per 24 hour  Intake 4853.95 ml  Output 7225 ml  Net -2371.05 ml     Physical Exam   Physical Exam: General:  NAD.  HEENT: normal  Neck: supple. JVP not elevated.  Carotids 2+ bilat; no bruits. No lymphadenopathy or thryomegaly appreciated. Cor: LVAD hum.  Lungs: Clear. Abdomen: obese soft, nontender, non-distended. No hepatosplenomegaly. No bruits or masses. Good bowel sounds. Driveline site clean. Anchor in place.  Extremities: no cyanosis, clubbing, rash. Warm no edema  3 way flow draining kool-aid color urine    Neuro: alert & oriented x 3. No focal deficits. Moves all 4 without problem    Telemetry    A paced 90s personally reviewed.    Labs   Basic Metabolic Panel: Recent Labs  Lab 09/22/18 0439 09/23/18 0306 09/24/18 0205 09/25/18 0454 09/26/18 0524  NA 135 136 134* 135 138  K 5.0 4.8 4.7 4.2 4.8  CL 104 106 104 104 110  CO2 26 25 25 23 24   GLUCOSE 100* 101* 111* 146* 96  BUN 43* 42* 37* 32* 34*  CREATININE 1.97* 2.03* 1.88* 2.09* 1.89*  CALCIUM 8.6* 8.6* 8.8* 8.6* 8.2*    Liver Function Tests: No results for input(s): AST, ALT, ALKPHOS, BILITOT, PROT, ALBUMIN in the last 168 hours. No results for input(s): LIPASE, AMYLASE in the last 168 hours. No results for input(s): AMMONIA in the last 168 hours.  CBC: Recent Labs  Lab 09/22/18 0439 09/23/18 0306 09/24/18 0205 09/25/18 0454 09/26/18 0524  WBC 6.8 7.0 7.0 7.9 6.8  HGB 9.5* 9.5* 9.2* 8.7* 8.5*  HCT 31.4* 30.6* 30.8* 29.3* 27.1*  MCV 97.5 97.5 97.5 98.7 96.1  PLT 128* 128* 140* 133* 122*    INR: Recent Labs  Lab 09/22/18 0439 09/23/18 0306 09/24/18 0205 09/25/18 0454 09/26/18 0524  INR 1.34 1.35 1.33 1.33 1.48    Imaging   No results found.   Medications:     Scheduled Medications: . amiodarone  200 mg Oral BID  . atorvastatin  80 mg Oral QHS  . docusate sodium  200 mg  Oral Daily  . lactose free nutrition  237 mL Oral BID BM  . levothyroxine  150 mcg Oral QAC breakfast  . multivitamin with minerals  1 tablet Oral Daily  . pantoprazole  40 mg Oral BID AC  . sodium chloride flush  10-40 mL Intracatheter Q12H  . sotalol  80 mg Oral BID  . Warfarin - Pharmacist Dosing Inpatient   Does not apply q1800  . zinc sulfate  220 mg Oral Daily    Infusions: . sodium chloride 10 mL/hr at 09/21/18 1830  . cefTRIAXone (ROCEPHIN)  IV 1 g (09/26/18 1247)    PRN Medications: acetaminophen, iothalamate meglumine, promethazine, sodium chloride flush, temazepam   Patient Profile   [name]  is a 77 y.o. male with h/o VT, PAD and severe systolic HF (EF 40-97%) due to mixed ischemic/nonischemic CM. He is s/p HM II LVAD implant for destination therapy on October 19, 2013. VAD implantation was complicated by VT, syncope, AF/Aflutter and RHF. Required short term milrinone.   Admitted for heparin bridge for bladder tumor resection on 09/19/18.  Assessment/Plan:    1. 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  - Had 2 BCG infusions but discontinued due to inability to tolerate - 7/12 s/p TURB with right ureteral stent placement.  - Renal u/s 09/18/18 with recurrent R hydronephrosis without BOO. There is evidence of recurrent bladder tumor as well - Underwent recurrent bladder tumor resection 09/19/18 with Dr Gloriann Loan. Found to be large fast-growing tumor with obstruction of right ureteral orifice - Repeat renal u/s 09/20/18 shows moderate R hydronephrosis (slightly improved) and new left mild hydro.  - CT ab/pelvis 09/22/18 no metastatic or locally invasive disease. 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  - Foley removed 11/19 but he could not void so foley was replaced.   - Has 3-way cath in with bladder irrigation still requiring occasional manual irrigation due to clots - Continue to hold heparin. Will load coumadin slowly - Have D/w Drs. Waldon Reining with plan XRT to help try and stop bleeding. Had sim session today. Start XRT on Monday at 5p - Continue abx while cath in - hgb stable at 8.5  Transfuse < 8.0 2. Chronic systolic HF s/p HM-II LVAD 10/2014  - VAD interrogated personally. Parameters stable. Occasional PI events. No further low flows. Can give more IVF as needed - Hold heparin. Continue low-dose coumadin  - LDH 165 3. 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 Required HD x1 -  Creatininebaseline 1.4-1.5 - CR 2.16 -> 2.18 -> 2.26 -> 2.20 -> 2.10->1.97-> 2.03-->1.8-> 2.1-> 1,89   in setting of R> L hydronephrosis - Bladder tumor debulked but unable to find R ureteral orifice. Discussed possibility of perc tube with Urology but no indication currently.  - Mr. Carcione says he would not want nephrostomy. 4 h/o.RecurrentVTwith hypotension and low flow alarms on VAD: Underwent DC-CV 9/17, 08/17/17, and 04/01/18. S/p emergent DC-CV in ER 07/09/18  - Had several low flow alarms on 11/9, 11/4, and 10/31. ICD shows several runs of AT 10/24-10/28, all lasting about an hour. Nothing that correlates with low flow alarms.   - Quiescent VT onSotalol80mg BIDand amio - QTC slightly prolonged but stable. Previously reviewed with Dr. Caryl Comes and felt OK.  - Keep K > 4.0 Mg > 2.0. Stable  5. Atrial arrhythmias: Atrial  fibrillation, atrial tachycardia. S/p DC-CV 06/04/14, DC-CV 03/02/15 and 01/19/16.  -Remains in NSR. Device interrogation this admit shows multiple brief runs atrial tach 6. H/o Recurrent GIB - Most recent bleed 12/18. hgb 6.5, Transfused 5u. EGD with 2 AVMs treated with APC - No recent GI bleeding  I reviewed the LVAD parameters from today, and compared the results to the patient's prior recorded data.  No programming changes were made.  The LVAD is functioning within specified parameters.  The patient performs LVAD self-test daily.  LVAD interrogation was negative for any significant power changes, alarms or PI events/speed drops.  LVAD equipment check completed and is in good working order.  Back-up equipment present.   LVAD education done on emergency procedures and precautions and reviewed exit site care.   Length of Stay: Ava, MD 09/26/2018, 2:29 PM  VAD Team --- VAD ISSUES ONLY--- Pager [phone] (7am - 7am)  Advanced Heart Failure Team  Pager [phone] (M-F; 7a - 4p)  Please contact [facility] for night-coverage after hours (4p -7a ) and  weekends on [email or web address]

## 2018-09-26 NOTE — Progress Notes (Signed)
ANTICOAGULATION CONSULT NOTE - Follow Up Consult  Pharmacy Consult for warfarin Indication: LVAD  Allergies  Allergen Reactions  . Demerol [Meperidine] Other (See Comments)    Paralysis/ Could only move eyes    Patient Measurements: Height: 6' 0.01" (182.9 cm) Weight: 169 lb 15.6 oz (77.1 kg) IBW/kg (Calculated) : 77.62 Heparin Dosing Weight: 77kg  Vital Signs: Temp: 97.6 F (36.4 C) (11/22 1130) Temp Source: Oral (11/22 1130) BP: 95/85 (11/22 1134) Pulse Rate: 53 (11/22 1130)  Labs: Recent Labs    09/24/18 0205 09/25/18 0454 09/26/18 0524  HGB 9.2* 8.7* 8.5*  HCT 30.8* 29.3* 27.1*  PLT 140* 133* 122*  LABPROT 16.4* 16.3* 17.7*  INR 1.33 1.33 1.48  CREATININE 1.88* 2.09* 1.89*    Estimated Creatinine Clearance: 35.1 mL/min (A) (by C-G formula based on SCr of 1.89 mg/dL (H)).   Medical History: Past Medical History:  Diagnosis Date  . Arthritis   . Automatic implantable cardioverter-defibrillator in situ    a. s/p Medtronic Evera device serial [id number]    . Bradycardia   . CAD (coronary artery disease)    a. s/p MI 1982;  s/p DES to CFX 2011;  s/p NSTEMI 4/12 in setting of VTach;  cath 7/12:  LM ok, LAD mid 30-40%, Dx's with 40%, mCFX stent patent, prox to mid RCA occluded with R to R and L to R collats.  Medical Tx was continued  . Chronic systolic heart failure ([place])    a. s/p LVAD 10/2013 for destination therapy  . CKD (chronic kidney disease)   . CVD (cerebrovascular disease)    a. s/p L CEA 2008  . Cytopenia   . Dysrhythmia    AFIB  . History of GI bleed    a. on ASA- started on plavix during 10/2015 admission for CVA  . HLD (hyperlipidemia)   . HTN (hypertension)   . Hypothyroidism   . Inappropriate therapy from implantable cardioverter-defibrillator    a. ATP for sinus tach SVT wavelet identified SVT but was no  passive (2014)  . Ischemic cardiomyopathy   . 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 Myocardial infarction ([place])    1992  . PAF  (paroxysmal atrial fibrillation) ([place])   . PVD (peripheral vascular disease) ([place])    a. 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 Shortness of breath dyspnea    W/ EXERTION   . Sleep apnea    NO CPAP NOW  HE SENT IT BACK YRS AGO  . Stroke [facility])    a. 10/2015 admission   . Ventricular tachycardia ([place])    a. s/p AICD;   h/o ICD shock;  Amiodarone Rx. S/P ablation in 2012   Assessment: 77 year old male with history of LVAD implant being admitted for hold of warfarin and heparin bridge in anticipation of bladder tumor resection on 11/15. PTA warfarin dose is 2 mg daily. Pt did receive vitamin K 2.5 mg po x1 on 11/14 and + 2 units of FFP on 11/15 + increased home Boost TID- which has some vitamin K.   INR still subtherapeutic, but increased at 1.48 today. Continues to have hematuria. Hemoglobin down to 8.5. Will continue to dose warfarin more conservatively given ongoing hematuria. Heparin remains off. Of note, he is scheduled to start radiation at [facility] next week.  Goal of Therapy:  INR 2.0-2.5 Heparin level 0.3-0.5 units/ml Monitor platelets by anticoagulation protocol: Yes   Plan:  Warfarin 2 mg PO x1 tonight Daily INR Monitor resolution of hematuria  Jackson Latino, PharmD PGY1 Pharmacy Resident Phone [phone] 09/26/2018     2:24 PM

## 2018-09-27 DIAGNOSIS — C679 Malignant neoplasm of bladder, unspecified: Secondary | ICD-10-CM | POA: Diagnosis not present

## 2018-09-27 DIAGNOSIS — N1330 Unspecified hydronephrosis: Secondary | ICD-10-CM | POA: Diagnosis not present

## 2018-09-27 DIAGNOSIS — Z95811 Presence of heart assist device: Secondary | ICD-10-CM | POA: Diagnosis not present

## 2018-09-27 DIAGNOSIS — C672 Malignant neoplasm of lateral wall of bladder: Secondary | ICD-10-CM | POA: Diagnosis not present

## 2018-09-27 LAB — BASIC METABOLIC PANEL
Anion gap: 6 (ref 5–15)
BUN: 31 mg/dL — ABNORMAL HIGH (ref 8–23)
CO2: 23 mmol/L (ref 22–32)
Calcium: 8.3 mg/dL — ABNORMAL LOW (ref 8.9–10.3)
Chloride: 106 mmol/L (ref 98–111)
Creatinine, Ser: 1.86 mg/dL — ABNORMAL HIGH (ref 0.61–1.24)
GFR calc Af Amer: 38 mL/min — ABNORMAL LOW (ref >60)
GFR calc non Af Amer: 33 mL/min — ABNORMAL LOW (ref >60)
Glucose, Bld: 101 mg/dL — ABNORMAL HIGH (ref 70–99)
Potassium: 4.6 mmol/L (ref 3.5–5.1)
Sodium: 135 mmol/L (ref 135–145)

## 2018-09-27 LAB — CBC
HEMATOCRIT: 27.4 % — AB (ref 39.0–52.0)
HEMOGLOBIN: 8.4 g/dL — AB (ref 13.0–17.0)
MCH: 29.8 pg (ref 26.0–34.0)
MCHC: 30.7 g/dL (ref 30.0–36.0)
MCV: 97.2 fL (ref 80.0–100.0)
NRBC: 0 % (ref 0.0–0.2)
Platelets: 135 10*3/uL — ABNORMAL LOW (ref 150–400)
RBC: 2.82 MIL/uL — AB (ref 4.22–5.81)
RDW: 16.9 % — ABNORMAL HIGH (ref 11.5–15.5)
WBC: 7.7 10*3/uL (ref 4.0–10.5)

## 2018-09-27 LAB — LACTATE DEHYDROGENASE: LDH: 168 U/L (ref 98–192)

## 2018-09-27 LAB — PROTIME-INR
INR: 1.5
Prothrombin Time: 17.9 seconds — ABNORMAL HIGH (ref 11.4–15.2)

## 2018-09-27 MED ORDER — WARFARIN SODIUM 3 MG PO TABS
3.0000 mg | ORAL_TABLET | Freq: Once | ORAL | Status: AC
  Administered 2018-09-27: 3 mg via ORAL
  Filled 2018-09-27: qty 1

## 2018-09-27 NOTE — Plan of Care (Signed)
  Problem: Clinical Measurements: Goal: Ability to maintain clinical measurements within normal limits will improve Outcome: Progressing Goal: Will remain free from infection Outcome: Progressing Goal: Diagnostic test results will improve Outcome: Progressing Goal: Cardiovascular complication will be avoided Outcome: Progressing   Problem: Activity: Goal: Risk for activity intolerance will decrease Outcome: Progressing   Problem: Elimination: Goal: Will not experience complications related to urinary retention Outcome: Progressing

## 2018-09-27 NOTE — Progress Notes (Signed)
ANTICOAGULATION CONSULT NOTE - Follow Up Consult  Pharmacy Consult for warfarin Indication: LVAD  Allergies  Allergen Reactions  . Demerol [Meperidine] Other (See Comments)    Paralysis/ Could only move eyes    Patient Measurements: Height: 6' 0.01" (182.9 cm) Weight: 171 lb 3.2 oz (77.7 kg) IBW/kg (Calculated) : 77.62 Heparin Dosing Weight: 77kg  Vital Signs: Temp: 97.9 F (36.6 C) (11/23 0751) Temp Source: Oral (11/23 0751) BP: 95/78 (11/23 0751) Pulse Rate: 73 (11/23 0751)  Labs: Recent Labs    09/25/18 0454 09/26/18 0524 09/27/18 0321  HGB 8.7* 8.5* 8.4*  HCT 29.3* 27.1* 27.4*  PLT 133* 122* 135*  LABPROT 16.3* 17.7* 17.9*  INR 1.33 1.48 1.50  CREATININE 2.09* 1.89* 1.86*    Estimated Creatinine Clearance: 35.9 mL/min (A) (by C-G formula based on SCr of 1.86 mg/dL (H)).   Medical History: Past Medical History:  Diagnosis Date  . Arthritis   . Automatic implantable cardioverter-defibrillator in situ    a. s/p Medtronic Evera device serial [id number]    . Bradycardia   . CAD (coronary artery disease)    a. s/p MI 1982;  s/p DES to CFX 2011;  s/p NSTEMI 4/12 in setting of VTach;  cath 7/12:  LM ok, LAD mid 30-40%, Dx's with 40%, mCFX stent patent, prox to mid RCA occluded with R to R and L to R collats.  Medical Tx was continued  . Chronic systolic heart failure ([place])    a. s/p LVAD 10/2013 for destination therapy  . CKD (chronic kidney disease)   . CVD (cerebrovascular disease)    a. s/p L CEA 2008  . Cytopenia   . Dysrhythmia    AFIB  . History of GI bleed    a. on ASA- started on plavix during 10/2015 admission for CVA  . HLD (hyperlipidemia)   . HTN (hypertension)   . Hypothyroidism   . Inappropriate therapy from implantable cardioverter-defibrillator    a. ATP for sinus tach SVT wavelet identified SVT but was no  passive (2014)  . Ischemic cardiomyopathy   . 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 Myocardial infarction ([place])    1992  . PAF  (paroxysmal atrial fibrillation) ([place])   . PVD (peripheral vascular disease) ([place])    a. 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 Shortness of breath dyspnea    W/ EXERTION   . Sleep apnea    NO CPAP NOW  HE SENT IT BACK YRS AGO  . Stroke [facility])    a. 10/2015 admission   . Ventricular tachycardia ([place])    a. s/p AICD;   h/o ICD shock;  Amiodarone Rx. S/P ablation in 2012   Assessment: 77 year old male with history of LVAD implant being admitted for hold of warfarin and heparin bridge in anticipation of bladder tumor resection on 11/15. PTA warfarin dose is 2 mg daily. Pt did receive vitamin K 2.5 mg po x1 on 11/14 and + 2 units of FFP on 11/15 + increased home Boost TID- which has some vitamin K.   INR still subtherapeutic, but increased at 1.5 today. Continues to have hematuria. Hemoglobin stable at 8.4. Will continue to dose warfarin more conservatively given ongoing hematuria. Heparin remains off. Of note, he is scheduled to start radiation at [facility] next week.  Goal of Therapy:  INR 2.0-2.5 Heparin level 0.3-0.5 units/ml Monitor platelets by anticoagulation protocol: Yes   Plan:  Warfarin 3 mg PO x1 tonight Daily INR Monitor resolution of hematuria  Erin Hearing PharmD., BCPS Clinical Pharmacist 09/27/2018 8:54 AM

## 2018-09-27 NOTE — Progress Notes (Signed)
Urology Inpatient Progress Report  LVAD BLADDER CANCER  Procedure(s): TRANSURETHRAL RESECTION OF BLADDER TUMOR left  RETROGRADE PYELOGRAM CYSTOGRAM  8 Days Post-Op   Intv/Subj: Hematuria has has significantly improved in the past 24 hours per patient report. On minimal CBI with last manual irrigation by RN early last night. He currently has no complaints and reports he is eating and drinking well. His creatinine improved to 1.86 today.   Principal Problem:   Bladder cancer [facility]) Active Problems:   Atrial tachycardia (HCC)   LVAD (left ventricular assist device) present ([place])   AKI (acute kidney injury) ([place])   Hydronephrosis of right kidney  Current Facility-Administered Medications  Medication Dose Route Frequency Provider Last Rate Last Dose  . 0.9 %  sodium chloride infusion   Intravenous Continuous Oleta Mouse, MD 10 mL/hr at 09/21/18 1830    . acetaminophen (TYLENOL) tablet 650 mg  650 mg Oral Q4H PRN Georgiana Shore, NP      . amiodarone (PACERONE) tablet 200 mg  200 mg Oral BID Georgiana Shore, NP   200 mg at 09/26/18 2120  . atorvastatin (LIPITOR) tablet 80 mg  80 mg Oral QHS Georgiana Shore, NP   80 mg at 09/26/18 2120  . cefTRIAXone (ROCEPHIN) 1 g in sodium chloride 0.9 % 100 mL IVPB  1 g Intravenous Q24H Lore, Melissa A, RPH 200 mL/hr at 09/26/18 1247 1 g at 09/26/18 1247  . docusate sodium (COLACE) capsule 200 mg  200 mg Oral Daily Georgiana Shore, NP   200 mg at 09/23/18 1028  . iothalamate meglumine (CYSTO-CONRAY II) 17.2 % solution 250 mL  250 mL Intravesical Once PRN Marton Redwood III, MD      . lactose free nutrition (BOOST PLUS) liquid 237 mL  237 mL Oral BID BM Bensimhon, Shaune Pascal, MD   237 mL at 09/26/18 1000  . levothyroxine (SYNTHROID, LEVOTHROID) tablet 150 mcg  150 mcg Oral QAC breakfast Georgiana Shore, NP   150 mcg at 09/27/18 2841  . multivitamin with minerals tablet 1 tablet  1 tablet Oral Daily Georgiana Shore, NP   1 tablet at 09/26/18 1243  .  pantoprazole (PROTONIX) EC tablet 40 mg  40 mg Oral BID AC Georgiana Shore, NP   40 mg at 09/27/18 3244  . promethazine (PHENERGAN) tablet 12.5 mg  12.5 mg Oral Q6H PRN Georgiana Shore, NP      . sodium chloride flush (NS) 0.9 % injection 10-40 mL  10-40 mL Intracatheter Q12H Bensimhon, Shaune Pascal, MD   10 mL at 09/26/18 2120  . sodium chloride flush (NS) 0.9 % injection 10-40 mL  10-40 mL Intracatheter PRN Bensimhon, Shaune Pascal, MD      . sotalol (BETAPACE) tablet 80 mg  80 mg Oral BID Georgiana Shore, NP   80 mg at 09/26/18 2120  . temazepam (RESTORIL) capsule 30 mg  30 mg Oral QHS PRN Georgiana Shore, NP   30 mg at 09/26/18 2120  . Warfarin - Pharmacist Dosing Inpatient   Does not apply q1800 Wilburn Cornelia, RPH   1 each at 09/25/18 1735  . zinc sulfate capsule 220 mg  220 mg Oral Daily Bensimhon, Shaune Pascal, MD   220 mg at 09/26/18 1243     Objective: Vital: Vitals:   09/26/18 1615 09/26/18 1917 09/26/18 2359 09/27/18 0306  BP:  (!) 79/64 (!) 74/64 (!) 88/69  Pulse: 94 92 74 78  Resp: 17 19 17  (!)  21  Temp: 98 F (36.7 C) 97.8 F (36.6 C) 98.8 F (37.1 C) 97.9 F (36.6 C)  TempSrc: Oral Oral Oral Oral  SpO2: 98% 98% 99% 99%  Weight:    77.7 kg  Height:       I/Os: I/O last 3 completed shifts: In: 7628 [P.O.:520; Other:5800; IV Piggyback:100] Out: 31517 [OHYWV:37106]  Physical Exam:  General: Patient is in no apparent distress Lungs: Normal respiratory effort, chest expands symmetrically. GI: The abdomen is soft and nontender without mass. Foley: Three-way Foley catheter in place draining clear on very slow drip CBI Ext: lower extremities symmetric  Lab Results: Recent Labs    09/25/18 0454 09/26/18 0524 09/27/18 0321  WBC 7.9 6.8 7.7  HGB 8.7* 8.5* 8.4*  HCT 29.3* 27.1* 27.4*   Recent Labs    09/25/18 0454 09/26/18 0524 09/27/18 0321  NA 135 138 135  K 4.2 4.8 4.6  CL 104 110 106  CO2 23 24 23   GLUCOSE 146* 96 101*  BUN 32* 34* 31*  CREATININE 2.09* 1.89*  1.86*  CALCIUM 8.6* 8.2* 8.3*   Recent Labs    09/25/18 0454 09/26/18 0524 09/27/18 0321  INR 1.33 1.48 1.50   No results for input(s): LABURIN in the last 72 hours. Results for orders placed or performed during the hospital encounter of 09/17/18  MRSA PCR Screening     Status: None   Collection Time: 09/17/18  1:36 PM  Result Value Ref Range Status   MRSA by PCR NEGATIVE NEGATIVE Final    Comment:        The GeneXpert MRSA Assay (FDA approved for NASAL specimens only), is one component of a comprehensive MRSA colonization surveillance program. It is not intended to diagnose MRSA infection nor to guide or monitor treatment for MRSA infections. Performed at [facility], [place] [address], [place], [place] [address]    *Note: Due to a large number of results and/or encounters for the requested time period, some results have not been displayed. A complete set of results can be found in Results Review.    Studies/Results: No results found.  Assessment: Muscle invasive bladder cancer Persistent gross hematuria Bilateral hydronephrosis  Procedure(s): TRANSURETHRAL RESECTION OF BLADDER TUMOR left  RETROGRADE PYELOGRAM CYSTOGRAM, 8 Days Post-Op  doing well.  Plan: Agree with palliative radiation in an attempt to stop bleeding considering he has muscle invasive disease and conservative measures are not currently stopping the bleeding. Plan to start palliative radiation on Monday. For now, continue continuous bladder irrigation with manual irrigation as needed.  Blood transfusions as needed.  Observe hydronephrosis given overall stable creatinine.

## 2018-09-27 NOTE — Progress Notes (Signed)
Patient ID: [name], male   DOB: 01/19/1941, 77 y.o.   MRN: [id number]   Advanced Heart Failure VAD Team Note  PCP-Cardiologist: No primary care provider on file.   Subjective:    Admitted for resection of recurrent bladder tumor. Renal u/s obtained and shows recurrent R hydronephrosis without BOO. There is evidence of recurrent bladder tumor as well.  Ramp echo 11/14 speed turned down to 8600 Went to OR 11/15 for bladder tumor resection. Multiple large tumors. Unable to uncover R ureteral orifice.  Repeat renal u/s showed moderate R hydronephrosis (slightly improved) and new left mild hydro.  Has 3-way Foley in place for bladder irrigation. Off heparin. Still with pink urine. Frequent clots requiring manual irrigation.  No pain. No dyspnea.  No further low flow alarms on LVAD.    LVAD Interrogation HM 2: Speed: 8600 Flow: 3.9 PI: 5.1 Power: 4.3. 10 PI events.    Objective:    Vital Signs:   Temp:  [97.6 F (36.4 C)-98.8 F (37.1 C)] 97.9 F (36.6 C) (11/23 0751) Pulse Rate:  [53-94] 73 (11/23 0751) Resp:  [14-21] 16 (11/23 0751) BP: (74-95)/(64-85) 95/78 (11/23 0751) SpO2:  [96 %-99 %] 98 % (11/23 0751) Weight:  [77.7 kg] 77.7 kg (11/23 0306) Last BM Date: 09/24/18 Mean arterial Pressure 70s-80s  Intake/Output:   Intake/Output Summary (Last 24 hours) at 09/27/2018 0926 Last data filed at 09/27/2018 0800 Gross per 24 hour  Intake 6680 ml  Output 10075 ml  Net -3395 ml     Physical Exam   Physical Exam: General: Well appearing this am. NAD.  HEENT: Normal. Neck: Supple, JVP 7-8 cm. Carotids OK.  Cardiac:  Mechanical heart sounds with LVAD hum present.  Lungs:  CTAB, normal effort.  Abdomen:  NT, ND, no HSM. No bruits or masses. +BS  LVAD exit site: Well-healed and incorporated. Dressing dry and intact. No erythema or drainage. Stabilization device present and accurately applied. Driveline dressing changed daily per sterile technique. Extremities:  Warm  and dry. No cyanosis, clubbing, rash, or edema.  Neuro:  Alert & oriented x 3. Cranial nerves grossly intact. Moves all 4 extremities w/o difficulty. Affect pleasant   GU: 3-way foley in place  Telemetry    A paced 80s personally reviewed.    Labs   Basic Metabolic Panel: Recent Labs  Lab 09/23/18 0306 09/24/18 0205 09/25/18 0454 09/26/18 0524 09/27/18 0321  NA 136 134* 135 138 135  K 4.8 4.7 4.2 4.8 4.6  CL 106 104 104 110 106  CO2 25 25 23 24 23   GLUCOSE 101* 111* 146* 96 101*  BUN 42* 37* 32* 34* 31*  CREATININE 2.03* 1.88* 2.09* 1.89* 1.86*  CALCIUM 8.6* 8.8* 8.6* 8.2* 8.3*    Liver Function Tests: No results for input(s): AST, ALT, ALKPHOS, BILITOT, PROT, ALBUMIN in the last 168 hours. No results for input(s): LIPASE, AMYLASE in the last 168 hours. No results for input(s): AMMONIA in the last 168 hours.  CBC: Recent Labs  Lab 09/23/18 0306 09/24/18 0205 09/25/18 0454 09/26/18 0524 09/27/18 0321  WBC 7.0 7.0 7.9 6.8 7.7  HGB 9.5* 9.2* 8.7* 8.5* 8.4*  HCT 30.6* 30.8* 29.3* 27.1* 27.4*  MCV 97.5 97.5 98.7 96.1 97.2  PLT 128* 140* 133* 122* 135*    INR: Recent Labs  Lab 09/23/18 0306 09/24/18 0205 09/25/18 0454 09/26/18 0524 09/27/18 0321  INR 1.35 1.33 1.33 1.48 1.50    Imaging   No results found.   Medications:  Scheduled Medications: . amiodarone  200 mg Oral BID  . atorvastatin  80 mg Oral QHS  . docusate sodium  200 mg Oral Daily  . lactose free nutrition  237 mL Oral BID BM  . levothyroxine  150 mcg Oral QAC breakfast  . multivitamin with minerals  1 tablet Oral Daily  . pantoprazole  40 mg Oral BID AC  . sodium chloride flush  10-40 mL Intracatheter Q12H  . sotalol  80 mg Oral BID  . warfarin  3 mg Oral ONCE-1800  . Warfarin - Pharmacist Dosing Inpatient   Does not apply q1800  . zinc sulfate  220 mg Oral Daily    Infusions: . sodium chloride 10 mL/hr at 09/21/18 1830  . cefTRIAXone (ROCEPHIN)  IV 1 g (09/26/18 1247)     PRN Medications: acetaminophen, iothalamate meglumine, promethazine, sodium chloride flush, temazepam   Patient Profile   [name] is a 77 y.o. 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 He is s/p HM II LVAD implant for destination therapy on October 19, 2013. VAD implantation was complicated by VT, syncope, AF/Aflutter and RHF. Required short term milrinone.   Admitted for heparin bridge for bladder tumor resection on 09/19/18.  Assessment/Plan:    1. 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  - Had 2 BCG infusions but discontinued due to inability to tolerate - 7/12 s/p TURB with right ureteral stent placement.  - Renal u/s 09/18/18 with recurrent R hydronephrosis without BOO. There is evidence of recurrent bladder tumor as well - Underwent recurrent bladder tumor resection 09/19/18 with Dr Gloriann Loan. Found to be large fast-growing tumor with obstruction of right ureteral orifice - Repeat renal u/s 09/20/18 shows moderate R hydronephrosis (slightly improved) and new left mild hydro.  - CT ab/pelvis 09/22/18 no metastatic or locally invasive disease. 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  - Foley removed 11/19 but he could not void so foley was replaced.   - Has 3-way cath in with bladder irrigation still requiring occasional manual irrigation due to clots - Continue to hold heparin. Will load coumadin slowly - Have D/w Drs. Waldon Reining with plan XRT to help try and stop bleeding. Start XRT on Monday at 5p - Continue abx while cath in (ceftriaxone) - hgb stable at 8.4  Transfuse < 8.0 2. Chronic systolic HF s/p HM-II LVAD 10/2014  - VAD interrogated personally. Parameters stable. Occasional PI events. No further low flows. Can give more IVF as needed - Hold heparin.  Continue low-dose coumadin  - LDH 168 3. 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 Required HD x1 - Creatininebaseline 1.4-1.5 - CR 2.16 -> 2.18 -> 2.26 -> 2.20 -> 2.10->1.97-> 2.03-->1.8-> 2.1-> 1.89 -> 1.86  in setting of R> L hydronephrosis - Bladder tumor debulked but unable to find R ureteral orifice. Discussed possibility of perc tube with Urology but no indication currently.  - [name] says he would not want nephrostomy. 4 h/o.RecurrentVTwith hypotension and low flow alarms on VAD: Underwent DC-CV 9/17, 08/17/17, and 04/01/18. S/p emergent DC-CV in ER 07/09/18  - Had several low flow alarms on 11/9, 11/4, and 10/31. ICD shows several runs of AT 10/24-10/28, all lasting about an hour. Nothing that correlates with low flow alarms.   - Quiescent VT onSotalol80mg BIDand  amio - QTC slightly prolonged but stable. Previously reviewed with Dr. Caryl Comes and felt OK.  - Keep K > 4.0 Mg > 2.0. Stable  5. Atrial arrhythmias: Atrial fibrillation, atrial tachycardia. S/p DC-CV 06/04/14, DC-CV 03/02/15 and 01/19/16.  -Remains in NSR. Device interrogation this admit shows multiple brief runs atrial tach 6. H/o Recurrent GIB - Most recent bleed 12/18. hgb 6.5, Transfused 5u. EGD with 2 AVMs treated with APC - No recent GI bleeding  I reviewed the LVAD parameters from today, and compared the results to the patient's prior recorded data.  No programming changes were made.  The LVAD is functioning within specified parameters.  The patient performs LVAD self-test daily.  LVAD interrogation was negative for any significant power changes, alarms or PI events/speed drops.  LVAD equipment check completed and is in good working order.  Back-up equipment present.   LVAD education done on emergency procedures and precautions and reviewed exit site care.   Length of Stay: [place], MD 09/27/2018, 9:26 AM  VAD Team --- VAD ISSUES ONLY--- Pager [phone] (7am -  7am)  Advanced Heart Failure Team  Pager [phone] (M-F; 7a - 4p)  Please contact [facility] for night-coverage after hours (4p -7a ) and weekends on [email or web address]

## 2018-09-28 DIAGNOSIS — Z95811 Presence of heart assist device: Secondary | ICD-10-CM | POA: Diagnosis not present

## 2018-09-28 DIAGNOSIS — C679 Malignant neoplasm of bladder, unspecified: Secondary | ICD-10-CM | POA: Diagnosis not present

## 2018-09-28 DIAGNOSIS — C672 Malignant neoplasm of lateral wall of bladder: Secondary | ICD-10-CM | POA: Diagnosis not present

## 2018-09-28 LAB — BASIC METABOLIC PANEL
Anion gap: 3 — ABNORMAL LOW (ref 5–15)
BUN: 25 mg/dL — AB (ref 8–23)
CALCIUM: 8.2 mg/dL — AB (ref 8.9–10.3)
CO2: 25 mmol/L (ref 22–32)
Chloride: 107 mmol/L (ref 98–111)
Creatinine, Ser: 1.73 mg/dL — ABNORMAL HIGH (ref 0.61–1.24)
GFR calc Af Amer: 42 mL/min — ABNORMAL LOW (ref >60)
GFR, EST NON AFRICAN AMERICAN: 36 mL/min — AB (ref >60)
GLUCOSE: 100 mg/dL — AB (ref 70–99)
Potassium: 4.7 mmol/L (ref 3.5–5.1)
SODIUM: 135 mmol/L (ref 135–145)

## 2018-09-28 LAB — CBC
HEMATOCRIT: 26.4 % — AB (ref 39.0–52.0)
HEMOGLOBIN: 8.1 g/dL — AB (ref 13.0–17.0)
MCH: 29.9 pg (ref 26.0–34.0)
MCHC: 30.7 g/dL (ref 30.0–36.0)
MCV: 97.4 fL (ref 80.0–100.0)
NRBC: 0 % (ref 0.0–0.2)
Platelets: 128 10*3/uL — ABNORMAL LOW (ref 150–400)
RBC: 2.71 MIL/uL — ABNORMAL LOW (ref 4.22–5.81)
RDW: 17.1 % — AB (ref 11.5–15.5)
WBC: 6.7 10*3/uL (ref 4.0–10.5)

## 2018-09-28 LAB — PROTIME-INR
INR: 1.65
PROTHROMBIN TIME: 19.3 s — AB (ref 11.4–15.2)

## 2018-09-28 LAB — LACTATE DEHYDROGENASE: LDH: 170 U/L (ref 98–192)

## 2018-09-28 MED ORDER — WARFARIN SODIUM 3 MG PO TABS
3.0000 mg | ORAL_TABLET | Freq: Once | ORAL | Status: AC
  Administered 2018-09-28: 3 mg via ORAL
  Filled 2018-09-28: qty 1

## 2018-09-28 NOTE — Progress Notes (Signed)
Patient ID: [name], male   DOB: 07/21/1941, 77 y.o.   MRN: [id number]   Advanced Heart Failure VAD Team Note  PCP-Cardiologist: No primary care provider on file.   Subjective:    Admitted for resection of recurrent bladder tumor. Renal u/s obtained and shows recurrent R hydronephrosis without BOO. There is evidence of recurrent bladder tumor as well.  Ramp echo 11/14 speed turned down to 8600 Went to OR 11/15 for bladder tumor resection. Multiple large tumors. Unable to uncover R ureteral orifice.  Repeat renal u/s showed moderate R hydronephrosis (slightly improved) and new left mild hydro.  Has 3-way Foley in place for bladder irrigation. Off heparin. Urine is clearing, not requiring manual irrigation at this point.   No pain. No dyspnea.     LVAD Interrogation HM 2: Speed: 8600 Flow: 3.5 PI: 6.8 Power: 4. No PI events.    Objective:    Vital Signs:   Temp:  [97.9 F (36.6 C)-98.4 F (36.9 C)] 97.9 F (36.6 C) (11/24 0500) Pulse Rate:  [71-122] 76 (11/24 0500) Resp:  [14-21] 14 (11/24 0500) BP: (86-100)/(53-82) 100/82 (11/24 0500) SpO2:  [93 %-98 %] 98 % (11/24 0500) Weight:  [76.9 kg] 76.9 kg (11/24 0500) Last BM Date: 09/24/18 Mean arterial Pressure 80s  Intake/Output:   Intake/Output Summary (Last 24 hours) at 09/28/2018 0853 Last data filed at 09/28/2018 0500 Gross per 24 hour  Intake 660 ml  Output 3400 ml  Net -2740 ml     Physical Exam   Physical Exam: General: Well appearing this am. NAD.  HEENT: Normal. Neck: Supple, JVP 7-8 cm. Carotids OK.  Cardiac:  Mechanical heart sounds with LVAD hum present.  Lungs:  CTAB, normal effort.  Abdomen:  NT, ND, no HSM. No bruits or masses. +BS  LVAD exit site: Well-healed and incorporated. Dressing dry and intact. No erythema or drainage. Stabilization device present and accurately applied. Driveline dressing changed daily per sterile technique. Extremities:  Warm and dry. No cyanosis, clubbing, rash, or  edema.  Neuro:  Alert & oriented x 3. Cranial nerves grossly intact. Moves all 4 extremities w/o difficulty. Affect pleasant     GU: 3-way foley in place, urine clearing  Telemetry    A paced 80s personally reviewed.    Labs   Basic Metabolic Panel: Recent Labs  Lab 09/24/18 0205 09/25/18 0454 09/26/18 0524 09/27/18 0321 09/28/18 0436  NA 134* 135 138 135 135  K 4.7 4.2 4.8 4.6 4.7  CL 104 104 110 106 107  CO2 25 23 24 23 25   GLUCOSE 111* 146* 96 101* 100*  BUN 37* 32* 34* 31* 25*  CREATININE 1.88* 2.09* 1.89* 1.86* 1.73*  CALCIUM 8.8* 8.6* 8.2* 8.3* 8.2*    Liver Function Tests: No results for input(s): AST, ALT, ALKPHOS, BILITOT, PROT, ALBUMIN in the last 168 hours. No results for input(s): LIPASE, AMYLASE in the last 168 hours. No results for input(s): AMMONIA in the last 168 hours.  CBC: Recent Labs  Lab 09/24/18 0205 09/25/18 0454 09/26/18 0524 09/27/18 0321 09/28/18 0436  WBC 7.0 7.9 6.8 7.7 6.7  HGB 9.2* 8.7* 8.5* 8.4* 8.1*  HCT 30.8* 29.3* 27.1* 27.4* 26.4*  MCV 97.5 98.7 96.1 97.2 97.4  PLT 140* 133* 122* 135* 128*    INR: Recent Labs  Lab 09/24/18 0205 09/25/18 0454 09/26/18 0524 09/27/18 0321 09/28/18 0436  INR 1.33 1.33 1.48 1.50 1.65    Imaging   No results found.   Medications:  Scheduled Medications: . amiodarone  200 mg Oral BID  . atorvastatin  80 mg Oral QHS  . docusate sodium  200 mg Oral Daily  . lactose free nutrition  237 mL Oral BID BM  . levothyroxine  150 mcg Oral QAC breakfast  . multivitamin with minerals  1 tablet Oral Daily  . pantoprazole  40 mg Oral BID AC  . sodium chloride flush  10-40 mL Intracatheter Q12H  . sotalol  80 mg Oral BID  . Warfarin - Pharmacist Dosing Inpatient   Does not apply q1800  . zinc sulfate  220 mg Oral Daily    Infusions: . sodium chloride 10 mL/hr at 09/21/18 1830  . cefTRIAXone (ROCEPHIN)  IV 1 g (09/27/18 1138)    PRN Medications: acetaminophen, iothalamate meglumine,  promethazine, sodium chloride flush, temazepam   Patient Profile   [name] is a 77 y.o. male with h/o VT, PAD and severe systolic HF (EF 30-16%) due to mixed ischemic/nonischemic CM. He is s/p HM II LVAD implant for destination therapy on October 19, 2013. VAD implantation was complicated by VT, syncope, AF/Aflutter and RHF. Required short term milrinone.   Admitted for heparin bridge for bladder tumor resection on 09/19/18.  Assessment/Plan:    1. 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  - Had 2 BCG infusions but discontinued due to inability to tolerate - 7/12 s/p TURB with right ureteral stent placement.  - Renal u/s 09/18/18 with recurrent R hydronephrosis without BOO. There is evidence of recurrent bladder tumor as well - Underwent recurrent bladder tumor resection 09/19/18 with Dr Gloriann Loan. Found to be large fast-growing tumor with obstruction of right ureteral orifice - Repeat renal u/s 09/20/18 shows moderate R hydronephrosis (slightly improved) and new left mild hydro.  - CT ab/pelvis 09/22/18 no metastatic or locally invasive disease. 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  - Foley removed 11/19 but he could not void so foley was replaced.   - Has 3-way cath in with bladder irrigation, urine clearing with no clots now.  - Continue to hold heparin. Will load coumadin slowly, INR up to 1.65.  - Have D/w Drs. Waldon Reining with plan XRT to help try and stop bleeding. Start XRT on Monday at 5p - Continue abx while cath in (ceftriaxone) - hgb mildly lower at 8.1  Transfuse < 8.0 2. Chronic systolic HF s/p HM-II LVAD 10/2014  - VAD interrogated personally. Parameters stable. Occasional PI events. No further low flows. Can give more IVF as needed - Hold heparin. Continue low-dose coumadin, INR up to 1.65.   - LDH 176 3.  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 Required HD x1 - Creatininebaseline 1.4-1.5 - CR 2.16 -> 2.18 -> 2.26 -> 2.20 -> 2.10->1.97-> 2.03-->1.8-> 2.1-> 1.89 -> 1.86 -> 1.73  in setting of R> L hydronephrosis - Bladder tumor debulked but unable to find R ureteral orifice. Discussed possibility of perc tube with Urology but no indication currently.  - Mr. Sermeno says he would not want nephrostomy. 4 h/o.RecurrentVTwith hypotension and low flow alarms on VAD: Underwent DC-CV 9/17, 08/17/17, and 04/01/18. S/p emergent DC-CV in ER 07/09/18  - Had several low flow alarms on 11/9, 11/4, and 10/31. ICD shows several runs of AT 10/24-10/28, all lasting about an hour. Nothing that correlates with low flow alarms.   -  Quiescent VT onSotalol80mg BIDand amio - QTC slightly prolonged but stable. Previously reviewed with Dr. Caryl Comes and felt OK.  - Keep K > 4.0 Mg > 2.0. Stable  5. Atrial arrhythmias: Atrial fibrillation, atrial tachycardia. S/p DC-CV 06/04/14, DC-CV 03/02/15 and 01/19/16.  -Remains in NSR. Device interrogation this admit shows multiple brief runs atrial tach 6. H/o Recurrent GIB - Most recent bleed 12/18. hgb 6.5, Transfused 5u. EGD with 2 AVMs treated with APC - No recent GI bleeding  I reviewed the LVAD parameters from today, and compared the results to the patient's prior recorded data.  No programming changes were made.  The LVAD is functioning within specified parameters.  The patient performs LVAD self-test daily.  LVAD interrogation was negative for any significant power changes, alarms or PI events/speed drops.  LVAD equipment check completed and is in good working order.  Back-up equipment present.   LVAD education done on emergency procedures and precautions and reviewed exit site care.   Length of Stay: 51  Loralie Champagne, MD 09/28/2018, 8:53 AM  VAD Team --- VAD ISSUES ONLY--- Pager [phone] (7am - 7am)  Advanced Heart Failure Team    Pager [phone] (M-F; 7a - 4p)  Please contact [facility] for night-coverage after hours (4p -7a ) and weekends on [email or web address]

## 2018-09-28 NOTE — Plan of Care (Signed)
  Problem: Clinical Measurements: Goal: Ability to maintain clinical measurements within normal limits will improve Outcome: Progressing Goal: Will remain free from infection Outcome: Progressing Goal: Diagnostic test results will improve Outcome: Progressing Goal: Cardiovascular complication will be avoided Outcome: Progressing   Problem: Elimination: Goal: Will not experience complications related to urinary retention Outcome: Progressing

## 2018-09-28 NOTE — Progress Notes (Signed)
Urology Inpatient Progress Report  LVAD BLADDER CANCER  Procedure(s): TRANSURETHRAL RESECTION OF BLADDER TUMOR left  RETROGRADE PYELOGRAM CYSTOGRAM  9 Days Post-Op   Intv/Subj: Hematuria continues to be improved. Urine is clear yellow without CBI this AM since bag had run out. Reports small passage of clots through foley without obstruction or need for irrigation. He currently has no complaints and reports he is eating and drinking well. His creatinine improved to 1.73 today.   Principal Problem:   Bladder cancer [facility]) Active Problems:   Atrial tachycardia (H[facility])   LVAD (left ventricular assist device) present ([place])   AKI (acute kidney injury) ([place])   Hydronephrosis of right kidney  Current Facility-Administered Medications  Medication Dose Route Frequency Provider Last Rate Last Dose  . 0.9 %  sodium chloride infusion   Intravenous Continuous Oleta Mouse, MD 10 mL/hr at 09/21/18 1830    . acetaminophen (TYLENOL) tablet 650 mg  650 mg Oral Q4H PRN Georgiana Shore, NP      . amiodarone (PACERONE) tablet 200 mg  200 mg Oral BID Georgiana Shore, NP   200 mg at 09/27/18 2113  . atorvastatin (LIPITOR) tablet 80 mg  80 mg Oral QHS Georgiana Shore, NP   80 mg at 09/27/18 2113  . cefTRIAXone (ROCEPHIN) 1 g in sodium chloride 0.9 % 100 mL IVPB  1 g Intravenous Q24H Lore, Melissa A, RPH 200 mL/hr at 09/27/18 1138 1 g at 09/27/18 1138  . docusate sodium (COLACE) capsule 200 mg  200 mg Oral Daily Georgiana Shore, NP   200 mg at 09/23/18 1028  . iothalamate meglumine (CYSTO-CONRAY II) 17.2 % solution 250 mL  250 mL Intravesical Once PRN Marton Redwood III, MD      . lactose free nutrition (BOOST PLUS) liquid 237 mL  237 mL Oral BID BM Bensimhon, Shaune Pascal, MD   237 mL at 09/26/18 1000  . levothyroxine (SYNTHROID, LEVOTHROID) tablet 150 mcg  150 mcg Oral QAC breakfast Georgiana Shore, NP   150 mcg at 09/27/18 5885  . multivitamin with minerals tablet 1 tablet  1 tablet Oral Daily Georgiana Shore, NP   1 tablet at 09/27/18 0900  . pantoprazole (PROTONIX) EC tablet 40 mg  40 mg Oral BID AC Georgiana Shore, NP   40 mg at 09/27/18 1720  . promethazine (PHENERGAN) tablet 12.5 mg  12.5 mg Oral Q6H PRN Georgiana Shore, NP      . sodium chloride flush (NS) 0.9 % injection 10-40 mL  10-40 mL Intracatheter Q12H Bensimhon, Shaune Pascal, MD   10 mL at 09/28/18 0437  . sodium chloride flush (NS) 0.9 % injection 10-40 mL  10-40 mL Intracatheter PRN Bensimhon, Shaune Pascal, MD      . sotalol (BETAPACE) tablet 80 mg  80 mg Oral BID Georgiana Shore, NP   80 mg at 09/27/18 2114  . temazepam (RESTORIL) capsule 30 mg  30 mg Oral QHS PRN Georgiana Shore, NP   30 mg at 09/27/18 2114  . Warfarin - Pharmacist Dosing Inpatient   Does not apply q1800 Wilburn Cornelia, RPH   1 each at 09/27/18 1721  . zinc sulfate capsule 220 mg  220 mg Oral Daily Bensimhon, Shaune Pascal, MD   220 mg at 09/27/18 0900     Objective: Vital: Vitals:   09/27/18 1053 09/27/18 1452 09/27/18 2028 09/27/18 2300  BP: (!) 86/53 (!) 89/77 91/80 (!) 89/72  Pulse: 74 (!) 122  71  Resp: 18 (!) 21  16  Temp: 98.1 F (36.7 C) 98.1 F (36.7 C) 97.9 F (36.6 C) 98.4 F (36.9 C)  TempSrc: Oral Oral Oral Oral  SpO2: 94% 93% 95% 94%  Weight:      Height:       I/Os: I/O last 3 completed shifts: In: 89211 [P.O.:1320; Other:8500; IV Piggyback:300] Out: 94174 [Urine:12425]  Physical Exam:  General: Patient is in no apparent distress Lungs: Normal respiratory effort, chest expands symmetrically. GI: The abdomen is soft and nontender without mass. Foley: Three-way Foley catheter in place draining clear yellow urine, CBI bag empty.  Ext: lower extremities symmetric  Lab Results: Recent Labs    09/26/18 0524 09/27/18 0321 09/28/18 0436  WBC 6.8 7.7 6.7  HGB 8.5* 8.4* 8.1*  HCT 27.1* 27.4* 26.4*   Recent Labs    09/26/18 0524 09/27/18 0321 09/28/18 0436  NA 138 135 135  K 4.8 4.6 4.7  CL 110 106 107  CO2 24 23 25   GLUCOSE  96 101* 100*  BUN 34* 31* 25*  CREATININE 1.89* 1.86* 1.73*  CALCIUM 8.2* 8.3* 8.2*   Recent Labs    09/26/18 0524 09/27/18 0321 09/28/18 0436  INR 1.48 1.50 1.65   No results for input(s): LABURIN in the last 72 hours. Results for orders placed or performed during the hospital encounter of 09/17/18  MRSA PCR Screening     Status: None   Collection Time: 09/17/18  1:36 PM  Result Value Ref Range Status   MRSA by PCR NEGATIVE NEGATIVE Final    Comment:        The GeneXpert MRSA Assay (FDA approved for NASAL specimens only), is one component of a comprehensive MRSA colonization surveillance program. It is not intended to diagnose MRSA infection nor to guide or monitor treatment for MRSA infections. Performed at [facility], [place] [address], [place],  [address]    *Note: Due to a large number of results and/or encounters for the requested time period, some results have not been displayed. A complete set of results can be found in Results Review.    Studies/Results: No results found.  Assessment: Muscle invasive bladder cancer Persistent gross hematuria Bilateral hydronephrosis  Procedure(s): TRANSURETHRAL RESECTION OF BLADDER TUMOR left  RETROGRADE PYELOGRAM CYSTOGRAM, 9 Days Post-Op  doing well.  Plan: Agree with palliative radiation in an attempt to stop bleeding considering he has muscle invasive disease and conservative measures are not currently stopping the bleeding. Plan to start palliative radiation on Monday. For now, continue continuous bladder irrigation with manual irrigation as needed.  Blood transfusions as needed.  Observe hydronephrosis given overall improving creatinine.

## 2018-09-28 NOTE — Progress Notes (Signed)
ANTICOAGULATION CONSULT NOTE - Follow Up Consult  Pharmacy Consult for warfarin Indication: LVAD  Allergies  Allergen Reactions  . Demerol [Meperidine] Other (See Comments)    Paralysis/ Could only move eyes    Patient Measurements: Height: 6' 0.01" (182.9 cm) Weight: 169 lb 9.6 oz (76.9 kg) IBW/kg (Calculated) : 77.62 Heparin Dosing Weight: 77kg  Vital Signs: Temp: 97.4 F (36.3 C) (11/24 0751) Temp Source: Oral (11/24 0751) BP: 100/79 (11/24 0751) Pulse Rate: 75 (11/24 0751)  Labs: Recent Labs    09/26/18 0524 09/27/18 0321 09/28/18 0436  HGB 8.5* 8.4* 8.1*  HCT 27.1* 27.4* 26.4*  PLT 122* 135* 128*  LABPROT 17.7* 17.9* 19.3*  INR 1.48 1.50 1.65  CREATININE 1.89* 1.86* 1.73*    Estimated Creatinine Clearance: 38.3 mL/min (A) (by C-G formula based on SCr of 1.73 mg/dL (H)).   Medical History: Past Medical History:  Diagnosis Date  . Arthritis   . Automatic implantable cardioverter-defibrillator in situ    a. s/p Medtronic Evera device serial [id number]    . Bradycardia   . CAD (coronary artery disease)    a. s/p MI 1982;  s/p DES to CFX 2011;  s/p NSTEMI 4/12 in setting of VTach;  cath 7/12:  LM ok, LAD mid 30-40%, Dx's with 40%, mCFX stent patent, prox to mid RCA occluded with R to R and L to R collats.  Medical Tx was continued  . Chronic systolic heart failure ([place])    a. s/p LVAD 10/2013 for destination therapy  . CKD (chronic kidney disease)   . CVD (cerebrovascular disease)    a. s/p L CEA 2008  . Cytopenia   . Dysrhythmia    AFIB  . History of GI bleed    a. on ASA- started on plavix during 10/2015 admission for CVA  . HLD (hyperlipidemia)   . HTN (hypertension)   . Hypothyroidism   . Inappropriate therapy from implantable cardioverter-defibrillator    a. ATP for sinus tach SVT wavelet identified SVT but was no  passive (2014)  . Ischemic cardiomyopathy   . Melanoma of ear ([place])    a. L Ear   . Myocardial infarction ([place])    1992  . PAF  (paroxysmal atrial fibrillation) ([place])   . PVD (peripheral vascular disease) ([place])    a. 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 Shortness of breath dyspnea    W/ EXERTION   . Sleep apnea    NO CPAP NOW  HE SENT IT BACK YRS AGO  . Stroke [facility])    a. 10/2015 admission   . Ventricular tachycardia ([place])    a. s/p AICD;   h/o ICD shock;  Amiodarone Rx. S/P ablation in 2012   Assessment: 77 year old male with history of LVAD implant being admitted for hold of warfarin and heparin bridge in anticipation of bladder tumor resection on 11/15. PTA warfarin dose is 2 mg daily. Pt did receive vitamin K 2.5 mg po x1 on 11/14 and + 2 units of FFP on 11/15 + increased home Boost TID- which has some vitamin K.   INR still subtherapeutic, but increased at 1.65 today. Hematuria improving. Hemoglobin stable at 8.1. Will continue to dose warfarin more conservatively given ongoing hematuria. Heparin remains off. Of note, he is scheduled to start radiation at [facility] next week.  Goal  of Therapy:  INR 2.0-2.5 Heparin level 0.3-0.5 units/ml Monitor platelets by anticoagulation protocol: Yes   Plan:  Warfarin 3 mg PO x1 tonight Daily INR Monitor resolution of hematuria  Erin Hearing PharmD., BCPS Clinical Pharmacist 09/28/2018 10:44 AM

## 2018-09-29 ENCOUNTER — Ambulatory Visit
Admit: 2018-09-29 | Discharge: 2018-09-29 | Disposition: A | Discharge status: Home / Self Care | Payer: Medicare Other | Attending: Radiation Oncology | Admitting: Radiation Oncology

## 2018-09-29 DIAGNOSIS — Z95811 Presence of heart assist device: Secondary | ICD-10-CM | POA: Diagnosis not present

## 2018-09-29 DIAGNOSIS — C672 Malignant neoplasm of lateral wall of bladder: Secondary | ICD-10-CM | POA: Diagnosis not present

## 2018-09-29 DIAGNOSIS — C679 Malignant neoplasm of bladder, unspecified: Secondary | ICD-10-CM | POA: Diagnosis not present

## 2018-09-29 LAB — CBC
HCT: 26.9 % — ABNORMAL LOW (ref 39.0–52.0)
Hemoglobin: 8.3 g/dL — ABNORMAL LOW (ref 13.0–17.0)
MCH: 30.1 pg (ref 26.0–34.0)
MCHC: 30.9 g/dL (ref 30.0–36.0)
MCV: 97.5 fL (ref 80.0–100.0)
Platelets: 139 10*3/uL — ABNORMAL LOW (ref 150–400)
RBC: 2.76 MIL/uL — ABNORMAL LOW (ref 4.22–5.81)
RDW: 16.9 % — AB (ref 11.5–15.5)
WBC: 6.8 10*3/uL (ref 4.0–10.5)
nRBC: 0 % (ref 0.0–0.2)

## 2018-09-29 LAB — BASIC METABOLIC PANEL
Anion gap: 5 (ref 5–15)
BUN: 25 mg/dL — AB (ref 8–23)
CALCIUM: 8.1 mg/dL — AB (ref 8.9–10.3)
CO2: 25 mmol/L (ref 22–32)
Chloride: 106 mmol/L (ref 98–111)
Creatinine, Ser: 1.69 mg/dL — ABNORMAL HIGH (ref 0.61–1.24)
GFR calc Af Amer: 43 mL/min — ABNORMAL LOW (ref >60)
GFR, EST NON AFRICAN AMERICAN: 37 mL/min — AB (ref >60)
Glucose, Bld: 98 mg/dL (ref 70–99)
POTASSIUM: 4.5 mmol/L (ref 3.5–5.1)
Sodium: 136 mmol/L (ref 135–145)

## 2018-09-29 LAB — PROTIME-INR
INR: 1.82
PROTHROMBIN TIME: 20.9 s — AB (ref 11.4–15.2)

## 2018-09-29 LAB — MAGNESIUM: Magnesium: 2.2 mg/dL (ref 1.7–2.4)

## 2018-09-29 LAB — LACTATE DEHYDROGENASE: LDH: 172 U/L (ref 98–192)

## 2018-09-29 MED ORDER — WARFARIN SODIUM 2 MG PO TABS
2.0000 mg | ORAL_TABLET | Freq: Once | ORAL | Status: AC
  Administered 2018-09-29: 2 mg via ORAL
  Filled 2018-09-29: qty 1

## 2018-09-29 NOTE — Progress Notes (Addendum)
LVAD Coordinator Rounding Note:  Admitted on 09/17/18 for heparin bridge for bladder tumor resection scheduled on Friday 11/15.   HM II Implanted 10/19/13 by Dr. Cyndia Bent under Destination Therapy criteria.  Pt scheduled for first radiation therapy this afternoon at 3:45 pm; updated [facility] of change in time for today; pt will need to arrive at 3:30 pm. Tues and Wed treatment times should remain at 9:00 am.   Vital signs: Temp: 97.5 HR: 74 Doppler Pressure:82 Automatic cuff:  88/74 (80) O2 Sat: 98%on RA  Weight:  170.5>168.4>167.9>163.8>172.1>172.6>172.1>168.2>169.9>170.8lbs  LVAD interrogation reveals:  Speed: 8600 Flow:3.3 Power: 4.0w PI: 6.3  Alarms:  18 Low Flow alarms 09/25/18 Events:0 - 10 PI events daily  Fixed speed: 8600 Low speed limit: 8000  Drive Line: Weekly dressings performed by bedside nurse or SO [name]. Next dressing change due 10/01/18.  Labs:  LDH trend: 261>219>208>199>193>201>184>165>168>170>172  INR trend:  2.5>2.18>1.92>1.34>1.35>1.35>1.33>1.48>1.5>1.65>1.82 Creat trend: 1.55>2.18>2.26>1.97>2.03>1.88>2.09>1.89>1.86>1.73>1.69  Blood products this admission: 09/19/18- 2 units FFP 11/21/1- 1 u/PRBC  Anticoagulation Plan: - INR Goal: 2.0 - 2.5  - ASA Dose: no ASA (removed 02/2017 due to GI bleed) - Heparin gtt > stopped 11/20 due to hematuria  Device: -Dual Medtronic ICD -Therapies: on at 200 bpm - Last device check 10/02/17  Significant Events on VAD Support:  09/01/14>>ureteral stone with cystoscopy and flexible ureteroscopy with lithotripsy and basket extraction 02/2015 >>GIB with 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 Started on Plavix 3/17>>Lumbar fusion L3-L5 9/17>>VT with DC-CV; amio increased 10/17>>ramp with RV failure; speed decreased to 8800 02/2017> GIB (removed ASA,  scopes negative) 03/2017>percutaneous lead repair after pump stops  10/18>>Slow VT. Cardioverted in ED and amiodarone uptitrated 12/18>>melena with scope. Hgb 6.5. EGD with 2 AVMs with APC 12/18>>recurrent hematuria. 6 mm left ureteral stone 1/19>>7 mm left ureteral stone removal with laser lithotripsy; left ureteral stent. Resection of 6 cm bladder tumor  Plan/Recommendations:  1. Please call VAD pager if any issues with VAD equipment or questions, concerns. 2. Pt is schedule for radiation at [facility] and Wed at 9:00 am. VAD Coordinator will accompany patient to treatment; transportation per [facility] (already scheduled).   Zada Girt RN [place] Coordinator  Office: [phone]  24/7 Pager: [phone]

## 2018-09-29 NOTE — Progress Notes (Signed)
Patient had nosebleed briefly that was resolved with held pressure. I will continue to monitor.

## 2018-09-29 NOTE — Progress Notes (Signed)
ANTICOAGULATION CONSULT NOTE - Follow Up Consult  Pharmacy Consult for warfarin Indication: LVAD  Allergies  Allergen Reactions  . Demerol [Meperidine] Other (See Comments)    Paralysis/ Could only move eyes    Patient Measurements: Height: 6' 0.01" (182.9 cm) Weight: 170 lb 13.7 oz (77.5 kg)(scale A) IBW/kg (Calculated) : 77.62 Heparin Dosing Weight: 77kg  Vital Signs: Temp: 97.5 F (36.4 C) (11/25 0744) Temp Source: Oral (11/25 0744) BP: 88/74 (11/25 0744) Pulse Rate: 74 (11/25 0744)  Labs: Recent Labs    09/27/18 0321 09/28/18 0436 09/29/18 0412  HGB 8.4* 8.1* 8.3*  HCT 27.4* 26.4* 26.9*  PLT 135* 128* 139*  LABPROT 17.9* 19.3* 20.9*  INR 1.50 1.65 1.82  CREATININE 1.86* 1.73* 1.69*    Estimated Creatinine Clearance: 39.5 mL/min (A) (by C-G formula based on SCr of 1.69 mg/dL (H)).   Medical History: Past Medical History:  Diagnosis Date  . Arthritis   . Automatic implantable cardioverter-defibrillator in situ    a. s/p Medtronic Evera device serial [id number]    . Bradycardia   . CAD (coronary artery disease)    a. s/p MI 1982;  s/p DES to CFX 2011;  s/p NSTEMI 4/12 in setting of VTach;  cath 7/12:  LM ok, LAD mid 30-40%, Dx's with 40%, mCFX stent patent, prox to mid RCA occluded with R to R and L to R collats.  Medical Tx was continued  . Chronic systolic heart failure ([place])    a. s/p LVAD 10/2013 for destination therapy  . CKD (chronic kidney disease)   . CVD (cerebrovascular disease)    a. s/p L CEA 2008  . Cytopenia   . Dysrhythmia    AFIB  . History of GI bleed    a. on ASA- started on plavix during 10/2015 admission for CVA  . HLD (hyperlipidemia)   . HTN (hypertension)   . Hypothyroidism   . Inappropriate therapy from implantable cardioverter-defibrillator    a. ATP for sinus tach SVT wavelet identified SVT but was no  passive (2014)  . Ischemic cardiomyopathy   . Melanoma of ear ([place])    a. L Ear   . Myocardial infarction ([place])    1992   . PAF (paroxysmal atrial fibrillation) ([place])   . PVD (peripheral vascular disease) ([place])    a. 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 Shortness of breath dyspnea    W/ EXERTION   . Sleep apnea    NO CPAP NOW  HE SENT IT BACK YRS AGO  . Stroke [facility])    a. 10/2015 admission   . Ventricular tachycardia ([place])    a. s/p AICD;   h/o ICD shock;  Amiodarone Rx. S/P ablation in 2012   Assessment: 77 year old male with history of LVAD implant being admitted for hold of warfarin and heparin bridge in anticipation of bladder tumor resection on 11/15. PTA warfarin dose is 2 mg daily. Pt did receive vitamin K 2.5 mg po x1 on 11/14 and + 2 units of FFP on 11/15 + increased home Boost TID- which has some vitamin K.   INR at goal this morning, up to 1.8. Hemoglobin stable at 8.3. Will continue to dose warfarin more conservatively given ongoing hematuria. Heparin remains off. Of note, he is scheduled to start radiation at [facility] today then-MWF .  Goal  of Therapy:  INR 2-2.5 - may need lower goal d/t profuse hematuria Monitor platelets by anticoagulation protocol: Yes   Plan:  Warfarin 2 mg PO x1 tonight Daily INR Monitor resolution of hematuria  Erin Hearing PharmD., BCPS Clinical Pharmacist 09/29/2018 8:37 AM

## 2018-09-29 NOTE — Progress Notes (Addendum)
Patient ID: [name], male   DOB: 10/28/1941, 77 y.o.   MRN: [id number]   Advanced Heart Failure VAD Team Note  PCP-Cardiologist: No primary care provider on file.   Subjective:    Admitted for resection of recurrent bladder tumor. Renal u/s obtained and shows recurrent R hydronephrosis without BOO. There is evidence of recurrent bladder tumor as well.  Ramp echo 11/14 speed turned down to 8600 Went to OR 11/15 for bladder tumor resection. Multiple large tumors. Unable to uncover R ureteral orifice.  Repeat renal u/s showed moderate R hydronephrosis (slightly improved) and new left mild hydro.  Overall feeling ok.  Urine clearing with bladder irrigation.   LVAD Interrogation HM 2: Speed: 8600 Flow: 3.3  PI: 5.8  Power: 4 No PI events.    Objective:    Vital Signs:   Temp:  [97.4 F (36.3 C)-98.1 F (36.7 C)] 97.5 F (36.4 C) (11/25 0744) Pulse Rate:  [25-74] 74 (11/25 0744) Resp:  [15-21] 15 (11/25 0744) BP: (80-125)/(64-86) 88/74 (11/25 0744) SpO2:  [94 %-100 %] 98 % (11/25 0744) Weight:  [77.5 kg] 77.5 kg (11/25 0415) Last BM Date: 09/27/18 Mean arterial Pressure 70-80s   Intake/Output:   Intake/Output Summary (Last 24 hours) at 09/29/2018 0836 Last data filed at 09/28/2018 2300 Gross per 24 hour  Intake 940 ml  Output 2650 ml  Net -1710 ml     Physical Exam    Physical Exam: GENERAL: NAD. In bed.  HEENT: normal  NECK: Supple, JVP 5-6  .  2+ bilaterally, no bruits.  No lymphadenopathy or thyromegaly appreciated.   CARDIAC:  Mechanical heart sounds with LVAD hum present.  LUNGS:  Clear to auscultation bilaterally.  ABDOMEN:  Soft, round, nontender, positive bowel sounds x4.     LVAD exit site: well-healed and incorporated.  Dressing dry and intact.  No erythema or drainage.  Stabilization device present and accurately applied.  Driveline dressing is being changed daily per sterile technique. EXTREMITIES:  Warm and dry, no cyanosis, clubbing, rash or  edema  NEUROLOGIC:  Alert and oriented x 4.  MAE.  Affect pleasant.    GU: Foley. Urine yellow.     Telemetry    A paced 80s personally reviewed.    Labs   Basic Metabolic Panel: Recent Labs  Lab 09/25/18 0454 09/26/18 0524 09/27/18 0321 09/28/18 0436 09/29/18 0412  NA 135 138 135 135 136  K 4.2 4.8 4.6 4.7 4.5  CL 104 110 106 107 106  CO2 23 24 23 25 25   GLUCOSE 146* 96 101* 100* 98  BUN 32* 34* 31* 25* 25*  CREATININE 2.09* 1.89* 1.86* 1.73* 1.69*  CALCIUM 8.6* 8.2* 8.3* 8.2* 8.1*  MG  --   --   --   --  2.2    Liver Function Tests: No results for input(s): AST, ALT, ALKPHOS, BILITOT, PROT, ALBUMIN in the last 168 hours. No results for input(s): LIPASE, AMYLASE in the last 168 hours. No results for input(s): AMMONIA in the last 168 hours.  CBC: Recent Labs  Lab 09/25/18 0454 09/26/18 0524 09/27/18 0321 09/28/18 0436 09/29/18 0412  WBC 7.9 6.8 7.7 6.7 6.8  HGB 8.7* 8.5* 8.4* 8.1* 8.3*  HCT 29.3* 27.1* 27.4* 26.4* 26.9*  MCV 98.7 96.1 97.2 97.4 97.5  PLT 133* 122* 135* 128* 139*    INR: Recent Labs  Lab 09/25/18 0454 09/26/18 0524 09/27/18 0321 09/28/18 0436 09/29/18 0412  INR 1.33 1.48 1.50 1.65 1.82    Imaging  No results found.   Medications:     Scheduled Medications: . amiodarone  200 mg Oral BID  . atorvastatin  80 mg Oral QHS  . docusate sodium  200 mg Oral Daily  . lactose free nutrition  237 mL Oral BID BM  . levothyroxine  150 mcg Oral QAC breakfast  . multivitamin with minerals  1 tablet Oral Daily  . pantoprazole  40 mg Oral BID AC  . sodium chloride flush  10-40 mL Intracatheter Q12H  . sotalol  80 mg Oral BID  . Warfarin - Pharmacist Dosing Inpatient   Does not apply q1800  . zinc sulfate  220 mg Oral Daily    Infusions: . sodium chloride 10 mL/hr at 09/21/18 1830    PRN Medications: acetaminophen, iothalamate meglumine, promethazine, sodium chloride flush, temazepam   Patient Profile   [name] is a  77 y.o. male with h/o VT, PAD and severe systolic HF (EF 62-83%) due to mixed ischemic/nonischemic CM. He is s/p HM II LVAD implant for destination therapy on October 19, 2013. VAD implantation was complicated by VT, syncope, AF/Aflutter and RHF. Required short term milrinone.   Admitted for heparin bridge for bladder tumor resection on 09/19/18.  Assessment/Plan:    1. 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  - Had 2 BCG infusions but discontinued due to inability to tolerate - 7/12 s/p TURB with right ureteral stent placement.  - Renal u/s 09/18/18 with recurrent R hydronephrosis without BOO. There is evidence of recurrent bladder tumor as well - Underwent recurrent bladder tumor resection 09/19/18 with [name]. Found to be large fast-growing tumor with obstruction of right ureteral orifice - Repeat renal u/s 09/20/18 shows moderate R hydronephrosis (slightly improved) and new left mild hydro.  - CT ab/pelvis 09/22/18 no metastatic or locally invasive disease. 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  - Foley removed 11/19 but he could not void so foley was replaced.   - Has 3-way cath in with bladder irrigation, urine clear. No clots.   -  Continue coumadin. INR 1.8.   - Have D/w [name] with plan XRT today at [facility].  - Continue abx while cath in (ceftriaxone) 2. Chronic systolic HF s/p HM-II LVAD 10/2014  - VAD interrogated personally. Parameters stable. Occasional PI events. No further low flows. Can give more IVF as needed - INR 1.8-2.3. Continue low-dose coumadin, INR up to 1.8.  - LDH 172.  3. 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 Required HD x1 - Creatininebaseline 1.4-1.5 - Creatinine peaked 2.3 in setting of R> L hydronephrosis. Creatinine now down to 1.7.  - Bladder tumor  debulked but unable to find R ureteral orifice. Discussed possibility of perc tube with Urology but no indication currently.  - [name] says he would not want nephrostomy. 4 h/o.RecurrentVTwith hypotension and low flow alarms on VAD: Underwent DC-CV 9/17, 08/17/17, and 04/01/18. S/p emergent DC-CV in ER 07/09/18  - Had several low flow alarms on 11/9, 11/4, and 10/31. ICD shows several runs of AT 10/24-10/28, all lasting about an hour. Nothing that correlates with low flow alarms.   - Quiescent VT onSotalol80mg BIDand amio - QTC slightly prolonged but stable. Previously reviewed with Frank. Caryl Comes and felt OK.  - Stable.  - Keep K > 4.0 Mg > 2.0. Stable  5.  Atrial arrhythmias: Atrial fibrillation, atrial tachycardia. S/p DC-CV 06/04/14, DC-CV 03/02/15 and 01/19/16.  -Device interrogation this admit shows multiple brief runs atrial tach 6. H/o Recurrent GIB - Most recent bleed 12/18. hgb 6.5, Transfused 5u. EGD with 2 AVMs treated with APC - No recent GI bleeding  Plan for radiation today.   I reviewed the LVAD parameters from today, and compared the results to the patient's prior recorded data.  No programming changes were made.  The LVAD is functioning within specified parameters.  The patient performs LVAD self-test daily.  LVAD interrogation was negative for any significant power changes, alarms or PI events/speed drops.  LVAD equipment check completed and is in good working order.  Back-up equipment present.   LVAD education done on emergency procedures and precautions and reviewed exit site care.   Length of Stay: [place], NP 09/29/2018, 8:36 AM  VAD Team --- VAD ISSUES ONLY--- Pager [phone] (7am - 7am)  Advanced Heart Failure Team  Pager [phone] (M-F; 7a - 4p)  Please contact [facility] for night-coverage after hours (4p -7a ) and weekends on [email or web address]  Patient seen with NP, agree with above note.   Urine has cleared considerably, will need to followup with  urology regarding length of time he will need CBI.    He is going for XRT at [facility] today.   LVAD parameters stable, hgb stable, creatinine stable.   Loralie Champagne 09/29/2018

## 2018-09-29 NOTE — Progress Notes (Signed)
Notified MD that patient had paced 34 beat run of wide QRS @ 2351. Asymptomatic and no alarms on LVAD. Plan is to check Mg+ with morning labs. I will continue to monitor.

## 2018-09-29 NOTE — Plan of Care (Signed)
  Problem: Clinical Measurements: Goal: Ability to maintain clinical measurements within normal limits will improve 09/29/2018 1250 by Don Perking, RN Outcome: Progressing 09/29/2018 0912 by Don Perking, RN Outcome: Progressing Goal: Will remain free from infection 09/29/2018 1250 by Don Perking, RN Outcome: Progressing 09/29/2018 0912 by Don Perking, RN Outcome: Progressing Goal: Diagnostic test results will improve 09/29/2018 1250 by Don Perking, RN Outcome: Progressing 09/29/2018 0912 by Don Perking, RN Outcome: Progressing Goal: Cardiovascular complication will be avoided 09/29/2018 1250 by Don Perking, RN Outcome: Progressing 09/29/2018 0912 by Don Perking, RN Outcome: Progressing   Problem: Elimination: Goal: Will not experience complications related to urinary retention 09/29/2018 1250 by Don Perking, RN Outcome: Progressing 09/29/2018 0912 by Don Perking, RN Outcome: Progressing

## 2018-09-29 NOTE — Plan of Care (Signed)
  Problem: Clinical Measurements: Goal: Ability to maintain clinical measurements within normal limits will improve Outcome: Progressing Goal: Will remain free from infection Outcome: Progressing Goal: Diagnostic test results will improve Outcome: Progressing Goal: Cardiovascular complication will be avoided Outcome: Progressing   Problem: Elimination: Goal: Will not experience complications related to urinary retention Outcome: Progressing

## 2018-09-30 ENCOUNTER — Ambulatory Visit
Admit: 2018-09-30 | Discharge: 2018-09-30 | Disposition: A | Discharge status: Home / Self Care | Payer: Medicare Other | Attending: Radiation Oncology | Admitting: Radiation Oncology

## 2018-09-30 DIAGNOSIS — C672 Malignant neoplasm of lateral wall of bladder: Secondary | ICD-10-CM | POA: Diagnosis not present

## 2018-09-30 DIAGNOSIS — C679 Malignant neoplasm of bladder, unspecified: Secondary | ICD-10-CM | POA: Diagnosis not present

## 2018-09-30 DIAGNOSIS — Z95811 Presence of heart assist device: Secondary | ICD-10-CM | POA: Diagnosis not present

## 2018-09-30 DIAGNOSIS — I5022 Chronic systolic (congestive) heart failure: Secondary | ICD-10-CM | POA: Diagnosis not present

## 2018-09-30 DIAGNOSIS — N1330 Unspecified hydronephrosis: Secondary | ICD-10-CM | POA: Diagnosis not present

## 2018-09-30 DIAGNOSIS — C670 Malignant neoplasm of trigone of bladder: Secondary | ICD-10-CM | POA: Diagnosis not present

## 2018-09-30 DIAGNOSIS — N179 Acute kidney failure, unspecified: Secondary | ICD-10-CM | POA: Diagnosis not present

## 2018-09-30 LAB — CBC
HEMATOCRIT: 25.7 % — AB (ref 39.0–52.0)
Hemoglobin: 7.8 g/dL — ABNORMAL LOW (ref 13.0–17.0)
MCH: 29.8 pg (ref 26.0–34.0)
MCHC: 30.4 g/dL (ref 30.0–36.0)
MCV: 98.1 fL (ref 80.0–100.0)
NRBC: 0 % (ref 0.0–0.2)
Platelets: 149 10*3/uL — ABNORMAL LOW (ref 150–400)
RBC: 2.62 MIL/uL — AB (ref 4.22–5.81)
RDW: 17.1 % — ABNORMAL HIGH (ref 11.5–15.5)
WBC: 5.8 10*3/uL (ref 4.0–10.5)

## 2018-09-30 LAB — BASIC METABOLIC PANEL
Anion gap: 7 (ref 5–15)
BUN: 28 mg/dL — ABNORMAL HIGH (ref 8–23)
CHLORIDE: 107 mmol/L (ref 98–111)
CO2: 23 mmol/L (ref 22–32)
CREATININE: 1.83 mg/dL — AB (ref 0.61–1.24)
Calcium: 7.9 mg/dL — ABNORMAL LOW (ref 8.9–10.3)
GFR calc non Af Amer: 34 mL/min — ABNORMAL LOW (ref >60)
GFR, EST AFRICAN AMERICAN: 39 mL/min — AB (ref >60)
Glucose, Bld: 101 mg/dL — ABNORMAL HIGH (ref 70–99)
POTASSIUM: 4.7 mmol/L (ref 3.5–5.1)
SODIUM: 137 mmol/L (ref 135–145)

## 2018-09-30 LAB — PREPARE RBC (CROSSMATCH)

## 2018-09-30 LAB — PROTIME-INR
INR: 2.02
Prothrombin Time: 22.6 seconds — ABNORMAL HIGH (ref 11.4–15.2)

## 2018-09-30 LAB — LACTATE DEHYDROGENASE: LDH: 167 U/L (ref 98–192)

## 2018-09-30 MED ORDER — SODIUM CHLORIDE 0.9 % IV BOLUS
500.0000 mL | Freq: Once | INTRAVENOUS | Status: AC
  Administered 2018-09-30: 500 mL via INTRAVENOUS

## 2018-09-30 MED ORDER — SODIUM CHLORIDE 0.9 % IV BOLUS: 500.0000 mL | Freq: Once | INTRAVENOUS | Status: AC

## 2018-09-30 MED ORDER — SODIUM CHLORIDE 0.9% IV SOLUTION: Freq: Once | INTRAVENOUS | Status: DC

## 2018-09-30 MED ORDER — BOOST / RESOURCE BREEZE PO LIQD CUSTOM
1.0000 | Freq: Two times a day (BID) | ORAL | Status: DC
  Administered 2018-10-01: 1 via ORAL

## 2018-09-30 MED ORDER — WARFARIN SODIUM 2 MG PO TABS
2.0000 mg | ORAL_TABLET | Freq: Once | ORAL | Status: AC
  Administered 2018-09-30: 2 mg via ORAL
  Filled 2018-09-30: qty 1

## 2018-09-30 NOTE — Progress Notes (Signed)
ANTICOAGULATION CONSULT NOTE - Follow Up Consult  Pharmacy Consult for warfarin Indication: LVAD  Allergies  Allergen Reactions  . Demerol [Meperidine] Other (See Comments)    Paralysis/ Could only move eyes    Patient Measurements: Height: 6' 0.01" (182.9 cm) Weight: 168 lb 6.9 oz (76.4 kg) IBW/kg (Calculated) : 77.62 Heparin Dosing Weight: 77kg  Vital Signs: Temp: 97.3 F (36.3 C) (11/26 0731) Temp Source: Oral (11/26 0731) BP: 98/84 (11/26 0731)  Labs: Recent Labs    09/28/18 0436 09/29/18 0412 09/30/18 0332  HGB 8.1* 8.3* 7.8*  HCT 26.4* 26.9* 25.7*  PLT 128* 139* 149*  LABPROT 19.3* 20.9* 22.6*  INR 1.65 1.82 2.02  CREATININE 1.73* 1.69* 1.83*    Estimated Creatinine Clearance: 36 mL/min (A) (by C-G formula based on SCr of 1.83 mg/dL (H)).   Medical History: Past Medical History:  Diagnosis Date  . Arthritis   . Automatic implantable cardioverter-defibrillator in situ    a. s/p Medtronic Evera device serial [id number]    . Bradycardia   . CAD (coronary artery disease)    a. s/p MI 1982;  s/p DES to CFX 2011;  s/p NSTEMI 4/12 in setting of VTach;  cath 7/12:  LM ok, LAD mid 30-40%, Dx's with 40%, mCFX stent patent, prox to mid RCA occluded with R to R and L to R collats.  Medical Tx was continued  . Chronic systolic heart failure ([place])    a. s/p LVAD 10/2013 for destination therapy  . CKD (chronic kidney disease)   . CVD (cerebrovascular disease)    a. s/p L CEA 2008  . Cytopenia   . Dysrhythmia    AFIB  . History of GI bleed    a. on ASA- started on plavix during 10/2015 admission for CVA  . HLD (hyperlipidemia)   . HTN (hypertension)   . Hypothyroidism   . Inappropriate therapy from implantable cardioverter-defibrillator    a. ATP for sinus tach SVT wavelet identified SVT but was no  passive (2014)  . Ischemic cardiomyopathy   . 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 L Ear   . Myocardial infarction ([place])    1992  . PAF (paroxysmal atrial fibrillation)  ([place])   . PVD (peripheral vascular disease) ([place])    a. 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 Shortness of breath dyspnea    W/ EXERTION   . Sleep apnea    NO CPAP NOW  HE SENT IT BACK YRS AGO  . Stroke [facility])    a. 10/2015 admission   . Ventricular tachycardia ([place])    a. s/p AICD;   h/o ICD shock;  Amiodarone Rx. S/P ablation in 2012   Assessment: 77 year old male with history of LVAD implant being admitted for hold of warfarin and heparin bridge in anticipation of bladder tumor resection on 11/15. PTA warfarin dose is 2 mg daily. Pt did receive vitamin K 2.5 mg po x1 on 11/14 and + 2 units of FFP on 11/15 + increased home Boost TID- which has some vitamin K.   INR at goal this morning, up to 2.02. Hemoglobin down to 8.3 - will get 1 unit of pRBC transfused. Hematuria clearing. Will continue to dose warfarin more conservatively given resolving hematuria and ongoing radiation (started yesterday and will get daily through Wednesday).  Goal of Therapy:  INR 2-2.5 Monitor platelets by anticoagulation protocol: Yes   Plan:  Warfarin 2 mg PO x1 tonight Daily INR Monitor resolution of hematuria  Jackson Latino, PharmD PGY1 Pharmacy Resident Phone [phone] 09/30/2018     11:41 AM

## 2018-09-30 NOTE — Progress Notes (Signed)
  Radiation Oncology         [phone] ________________________________  Name: MONTEZ CUDA MRN: [id number]  Date: 09/26/2018  DOB: 08-16-1941  Inpatient  SIMULATION AND TREATMENT PLANNING NOTE    ICD-10-CM   1. 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  Identity was confirmed.  All relevant records and images related to the planned course of therapy were reviewed.  The patient freely provided informed written consent to proceed with treatment after reviewing the details related to the planned course of therapy. The consent form was witnessed and verified by the simulation staff.  Then, the patient was set-up in a stable reproducible  supine position for radiation therapy.  CT images were obtained.  Surface markings were placed.  The CT images were loaded into the planning software.  Then the target and avoidance structures were contoured.  Treatment planning then occurred.  The radiation prescription was entered and confirmed.  Then, I designed and supervised the construction of a total of 5 medically necessary complex treatment devices consisting of leg positioner and 4 MLC apertures to cover the treated bladder shielding small bowel, rectum and hips.  I have requested : 3D Simulation  I have requested a DVH of the following structures: Rectum, small bowel, femoral heads and target.  PLAN:  The patient will receive 15 Gy in 3 fractions to provide palliative hemostasis.  Additional radiation could be considered later if medically stable and more durable long-term control clinically appropriate.  ________________________________  Sheral Apley. Tammi Klippel, M.D.

## 2018-09-30 NOTE — Progress Notes (Addendum)
Patient ID: [name], male   DOB: 05/26/1941, 77 y.o.   MRN: [id number]   Advanced Heart Failure VAD Team Note  PCP-Cardiologist: No primary care provider on file.   Subjective:    Admitted for resection of recurrent bladder tumor. Renal u/s obtained and shows recurrent R hydronephrosis without BOO. There is evidence of recurrent bladder tumor as well.  Ramp echo 11/14 speed turned down to 8600 Went to OR 11/15 for bladder tumor resection. Multiple large tumors. Unable to uncover R ureteral orifice.  Repeat renal u/s showed moderate R hydronephrosis (slightly improved) and new left mild hydro. - Started radiation 11/25  Overall feeling ok. Had low flow alarms this morning. Received NS bolus. Had 3 BMs. He reports normal bowel movement. No blood was noted. Urine clearing. INR 2.02 .  LVAD Interrogation HM 2: Speed: 8600 Flow: 2.8 PI: 5.8  Power: 4 Had low flows over night.     Objective:    Vital Signs:   Temp:  [97.3 F (36.3 C)-97.9 F (36.6 C)] 97.3 F (36.3 C) (11/26 0731) Pulse Rate:  [75-88] 75 (11/25 1958) Resp:  [15-22] 15 (11/26 0345) BP: (81-111)/(64-85) 98/84 (11/26 0731) SpO2:  [98 %-100 %] 98 % (11/25 1958) Weight:  [76.4 kg] 76.4 kg (11/26 0500) Last BM Date: 09/29/18 Mean arterial Pressure 70-80s    Intake/Output:   Intake/Output Summary (Last 24 hours) at 09/30/2018 0758 Last data filed at 09/30/2018 0734 Gross per 24 hour  Intake 2220 ml  Output 3000 ml  Net -780 ml     Physical Exam   Physical Exam: GENERAL:In bed. no acute distress. HEENT: normal  NECK: Supple, JVP flat .  2+ bilaterally, no bruits.  No lymphadenopathy or thyromegaly appreciated.   CARDIAC:  Mechanical heart sounds with LVAD hum present.  LUNGS:  Clear to auscultation bilaterally.  ABDOMEN:  Soft, round, nontender, positive bowel sounds x4.     LVAD exit site: well-healed and incorporated.  Dressing dry and intact.  No erythema or drainage.  Stabilization device present  and accurately applied.  Driveline dressing is being changed daily per sterile technique. EXTREMITIES:  Warm and dry, no cyanosis, clubbing, rash or edema  NEUROLOGIC:  Alert and oriented x 4. MAE.   Affect pleasant.    GU: Foley - yellow urine   Telemetry   A paced V sensed. NSVT 90s personally reviewed.    Labs   Basic Metabolic Panel: Recent Labs  Lab 09/26/18 0524 09/27/18 0321 09/28/18 0436 09/29/18 0412 09/30/18 0332  NA 138 135 135 136 137  K 4.8 4.6 4.7 4.5 4.7  CL 110 106 107 106 107  CO2 24 23 25 25 23   GLUCOSE 96 101* 100* 98 101*  BUN 34* 31* 25* 25* 28*  CREATININE 1.89* 1.86* 1.73* 1.69* 1.83*  CALCIUM 8.2* 8.3* 8.2* 8.1* 7.9*  MG  --   --   --  2.2  --     Liver Function Tests: No results for input(s): AST, ALT, ALKPHOS, BILITOT, PROT, ALBUMIN in the last 168 hours. No results for input(s): LIPASE, AMYLASE in the last 168 hours. No results for input(s): AMMONIA in the last 168 hours.  CBC: Recent Labs  Lab 09/26/18 0524 09/27/18 0321 09/28/18 0436 09/29/18 0412 09/30/18 0332  WBC 6.8 7.7 6.7 6.8 5.8  HGB 8.5* 8.4* 8.1* 8.3* 7.8*  HCT 27.1* 27.4* 26.4* 26.9* 25.7*  MCV 96.1 97.2 97.4 97.5 98.1  PLT 122* 135* 128* 139* 149*    INR:  Recent Labs  Lab 09/26/18 0524 09/27/18 0321 09/28/18 0436 09/29/18 0412 09/30/18 0332  INR 1.48 1.50 1.65 1.82 2.02    Imaging   No results found.   Medications:     Scheduled Medications: . amiodarone  200 mg Oral BID  . atorvastatin  80 mg Oral QHS  . docusate sodium  200 mg Oral Daily  . lactose free nutrition  237 mL Oral BID BM  . levothyroxine  150 mcg Oral QAC breakfast  . multivitamin with minerals  1 tablet Oral Daily  . pantoprazole  40 mg Oral BID AC  . sodium chloride flush  10-40 mL Intracatheter Q12H  . sotalol  80 mg Oral BID  . Warfarin - Pharmacist Dosing Inpatient   Does not apply q1800  . zinc sulfate  220 mg Oral Daily    Infusions: . sodium chloride 10 mL/hr at 09/21/18  1830  . sodium chloride 500 mL (09/30/18 0734)  . sodium chloride      PRN Medications: acetaminophen, iothalamate meglumine, promethazine, sodium chloride flush, temazepam   Patient Profile   [name] is a 77 y.o. 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 He is s/p HM II LVAD implant for destination therapy on October 19, 2013. VAD implantation was complicated by VT, syncope, AF/Aflutter and RHF. Required short term milrinone.   Admitted for heparin bridge for bladder tumor resection on 09/19/18.  Assessment/Plan:    1. 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  - Had 2 BCG infusions but discontinued due to inability to tolerate - 7/12 s/p TURB with right ureteral stent placement.  - Renal u/s 09/18/18 with recurrent R hydronephrosis without BOO. There is evidence of recurrent bladder tumor as well - Underwent recurrent bladder tumor resection 09/19/18 with Dr Gloriann Loan. Found to be large fast-growing tumor with obstruction of right ureteral orifice - Repeat renal u/s 09/20/18 shows moderate R hydronephrosis (slightly improved) and new left mild hydro.  - CT ab/pelvis 09/22/18 no metastatic or locally invasive disease. 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  - Foley removed 11/19 but he could not void so foley was replaced.   - Has 3-way cath in with bladder irrigation, urine clear. No clots.   -  Continue coumadin. INR 2.0 .   - Started radiation 11/25. Plan for radiation for the next 2 days.  - Continue abx while cath in (ceftriaxone) 2. Chronic systolic HF s/p HM-II LVAD 10/2014  - VAD interrogated personally. Having low flow this morning.  - Receiving IVF .  - Hgb down to 7.8. Give blood later today.   INR 1.8-2.3. Continue low-dose coumadin, INR  2.0   - LDH 167.  3.  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 Required HD x1 - Creatininebaseline 1.4-1.5 - Creatinine peaked 2.3 in setting of R> L hydronephrosis. Creatinine stable at 1.8  - Bladder tumor debulked but unable to find R ureteral orifice. Discussed possibility of perc tube with Urology but no indication currently.  - Mr. Ingwersen says he would not want nephrostomy. 4 h/o.RecurrentVTwith hypotension and low flow alarms on VAD: Underwent DC-CV 9/17, 08/17/17, and 04/01/18. S/p emergent DC-CV in ER 07/09/18  - Had several low flow alarms on 11/9, 11/4, and 10/31. ICD shows several runs of AT 10/24-10/28, all lasting  about an hour. Nothing that correlates with low flow alarms.   - Quiescent VT onSotalol80mg BIDand amio - QTC slightly prolonged but stable. Previously reviewed with Dr. Caryl Comes and felt OK.  - Over night he had NSVT . Ask medtronic to evaluate.  - Keep K > 4.0 Mg > 2.0. Stable  5. Atrial arrhythmias: Atrial fibrillation, atrial tachycardia. S/p DC-CV 06/04/14, DC-CV 03/02/15 and 01/19/16.  -Device interrogation this admit shows multiple brief runs atrial tach 6. H/o Recurrent GIB - Most recent bleed 12/18. hgb 6.5, Transfused 5u. EGD with 2 AVMs treated with APC - No recent GI bleeding 7. Anemia  Hgb down to 7.8. Urine does not look bloody today. No overt source. Suspect slowly dropping with recent hematuria. He will need 1UPRBCs when he returns for radiation. CBC in am.   I reviewed the LVAD parameters from today, and compared the results to the patient's prior recorded data.  No programming changes were made.  The LVAD is functioning within specified parameters.  The patient performs LVAD self-test daily.  LVAD interrogation was negative for any significant power changes, alarms or PI events/speed drops.  LVAD equipment check completed and is in good working order.  Back-up equipment present.   LVAD education done on emergency procedures and  precautions and reviewed exit site care.   Length of Stay: [place], NP 09/30/2018, 7:58 AM  VAD Team --- VAD ISSUES ONLY--- Pager [phone] (7am - 7am)  Advanced Heart Failure Team  Pager [phone] (M-F; 7a - 4p)  Please contact [facility] for night-coverage after hours (4p -7a ) and weekends on [email or web address]  Patient seen and examined with the above-signed Advanced Practice Provider and/or Housestaff. I personally reviewed laboratory data, imaging studies and relevant notes. I independently examined the patient and formulated the important aspects of the plan. I have edited the note to reflect any of my changes or salient points. I have personally discussed the plan with the patient and/or family.  Had XRT yesterday. Urine clearing now. Was hypotensive last night and received NS bolus. Hgb 7.8 today will give 1u RBCs after XRT. Will d/w Urology about timing of pulling CBI. Creatinine 1.8. VAD interrogated personally. Can give more IVF as needed for low flows.  Blood should help. Suspect prognosis is in terms of months.   Glori Bickers, MD  9:14 AM

## 2018-09-30 NOTE — Progress Notes (Signed)
Nutrition Follow-up  DOCUMENTATION CODES:   Non-severe (moderate) malnutrition in context of chronic illness  INTERVENTION:   -D/c Boost Plus due to poor acceptance -Cotninue 200 mg zinc sulfate daily -Boost Breeze po BID, each supplement provides 250 kcal and 9 grams of protein  NUTRITION DIAGNOSIS:   Moderate Malnutrition related to chronic illness(advanced heart failure, bladder cancer) as evidenced by mild fat depletion, moderate fat depletion, mild muscle depletion, moderate muscle depletion, percent weight loss.  Ongoing  GOAL:   Patient will meet greater than or equal to 90% of their needs  Progressing  MONITOR:   PO intake, Supplement acceptance, Labs, Weight trends, Skin, I & O's  REASON FOR ASSESSMENT:   Malnutrition Screening Tool    ASSESSMENT:   Frank Draheim McBrideis a 77 y.o.malewith h/o VT, PAD and severe systolic HF (EF 33-82%) due to mixed ischemic/nonischemic CM. He is s/p HM II LVAD implant for destination therapy on October 19, 2013. VAD implantation was complicated by VT, syncope, AF/Aflutter and RHF. Required short term milrinone.  He is being admitted today for heparin bridge for recurrent bladder tumor resection on 09/19/18 with Dr Gloriann Loan.  11/14- renal ultrasound reveals recurrent R hydronephrosis without BOO and recurrent bladder tumor 11/15- s/p procedures: Transurethral resection of bladder tumor--large; Left retrograde pyelogram; Cystogram 11/16- repeat renal ultrasound reveals moderate R hydronephrosis (slightly improved) and new left mild hydro 11/18- CT of abdomen and pelvis reveals no metastatic or locally invasive disease. Bilateral hydronephrosis L>R 11/25- radiation initiated   Reviewed I/O's: -1 L x 24 hours and -829 ml since admission  Reviewed urology note from 09/23/18; pt with muscle invasive bladder cancer (not a candidate for radical cystectomy or chemo, however, may be a candidate for palliative radiation). Pt refusing  nephrostomy tube.  Per goals of care meeting, pt plans to focus on palliative radiation. Per advanced heart failure notes, pt prognosis is months.   Pt out of room due to radiation treatments. Meal completion documented 50-100%. Pt is refusing Boost Plus supplements.  Labs reviewed.   Diet Order:   Diet Order            Diet Heart Room service appropriate? Yes; Fluid consistency: Thin  Diet effective now              EDUCATION NEEDS:   Education needs have been addressed  Skin:  Skin Assessment: Skin Integrity Issues: Skin Integrity Issues:: Incisions Incisions: closed penile  Last BM:  09/29/18  Height:   Ht Readings from Last 1 Encounters:  09/19/18 6' 0.01" (1.829 m)    Weight:   Wt Readings from Last 1 Encounters:  09/30/18 76.4 kg    Ideal Body Weight:  80.9 kg  BMI:  Body mass index is 22.84 kg/m.  Estimated Nutritional Needs:   Kcal:  2200-2400  Protein:  105-120 grams  Fluid:  2.2-2.4 L    [name], RD, LDN, CDE Pager: [phone] After hours Pager: [phone]

## 2018-09-30 NOTE — Progress Notes (Signed)
Echo Radiation Oncology Dept Therapy Treatment Record Phone [phone]   Radiation Therapy was administered to [name] on: 09/30/2018  2:19 PM and was treatment # 2 out of a planned course of 3 treatments.  Radiation Treatment  1). Beam photons with 6-10 energy  2). Brachytherapy None  3). Stereotactic Radiosurgery None  4). Other Radiation None     Calistro Rauf F Valta Dillon, RT (T)

## 2018-09-30 NOTE — Progress Notes (Addendum)
LVAD Coordinator Rounding Note:  Admitted on 09/17/18 for heparin bridge for bladder tumor resection scheduled on Friday 11/15.   HM II Implanted 10/19/13 by Dr. Cyndia Bent under Destination Therapy criteria.  Pt scheduled for second radiation therapy at 9:00 am; [facility] scheduled for transportation.   Pt had low MAPs and low PIs during night, received 500 cc NS bolus per Dr. Clayborne Dana order. Pt again had drop in MAP and PI with 4 low flow alarms at @ 7:00 am this morning. 1000 cc bolus currently infusing. Pt denied any symptoms during these times.   Hgb down, 1 unit PCs per Darrick Grinder, NP after we return from [facility].   Pt had NSVT overnight, Medtronic rep will come up and interrogate ICD.  VAD Coordinator accompanied pt to [facility] for #2 radiation treatment. Pt tolerated procedure without any problems.   Dr. Gloriann Loan in pts room upon return to [facility] hospital. Foley cath may be dc'd today, if no problems with voiding, he says pt may be able to go home tomorrow.   Vital signs: Temp:  97.3 HR: 92 Doppler Pressure:90 Automatic cuff:  98/84 (91) O2 Sat: 98%on RA  Weight:  170.5>168.4>167.9>163.8>172.1>172.6>172.1>168.2>169.9>170.8>168.4 lbs  LVAD interrogation reveals:  Speed: 8600 Flow:2.9 Power: 3.8w PI: 5.5  Alarms:  4 low flow alarms this am Events:0 - 10 PI events daily  Fixed speed: 8600 Low speed limit: 8000  Drive Line: Weekly dressings performed by bedside nurse or SO [name]. Next dressing change due 10/01/18.  Labs:  LDH trend: 261>219>208>199>193>201>184>165>168>170>172>167  INR trend:  2.5>2.18>1.92>1.34>1.35>1.35>1.33>1.48>1.5>1.65>1.82>2.02  Creat trend: 1.55>2.18>2.26>1.97>2.03>1.88>2.09>1.89>1.86>1.73>1.69>1.83  Blood products this admission: 09/19/18- 2 units FFP 11/21/1- 1 u/PRBC  Anticoagulation Plan: - INR Goal: 2.0 - 2.5  - ASA Dose: no ASA (removed 02/2017 due to GI bleed) - Heparin gtt > stopped 11/20 due to  hematuria  Device: -Dual Medtronic ICD -Therapies: on at 200 bpm - Last device check 10/02/17  Significant Events on VAD Support:  09/01/14>>ureteral stone with cystoscopy and flexible ureteroscopy with lithotripsy and basket extraction 02/2015 >>GIB with 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 Started on Plavix 3/17>>Lumbar fusion L3-L5 9/17>>VT with DC-CV; amio increased 10/17>>ramp with RV failure; speed decreased to 8800 02/2017> GIB (removed ASA, scopes negative) 03/2017>percutaneous lead repair after pump stops  10/18>>Slow VT. Cardioverted in ED and amiodarone uptitrated 12/18>>melena with scope. Hgb 6.5. EGD with 2 AVMs with APC 12/18>>recurrent hematuria. 6 mm left ureteral stone 1/19>>7 mm left ureteral stone removal with laser lithotripsy; left ureteral stent. Resection of 6 cm bladder tumor  Plan/Recommendations:  1. Please call VAD pager if any issues with VAD equipment or questions, concerns. 2. Pt is schedule for #3 radiation treatment tomorrow at 9:00 at [facility]. [facility] for transportation, already scheduled. .  3. Weekly dressing change due 10/01/18; may be performed by bedside nurse.    Zada Girt RN [facility] Coordinator  Office: [phone]  24/7 Pager: [phone]

## 2018-09-30 NOTE — Progress Notes (Signed)
Notified Molly LVAD coordinator about doppler pressure 54. Automatic pressure 77/58 (65). Patient sleeping comfortable. Speed: 8600 Flow: 3.2  Power: 4.0  PI: 5.4. Plan is to give 500 NS bolus. I will continue to monitor.

## 2018-09-30 NOTE — Progress Notes (Signed)
Notified [name] LVAD coordinator again due to low flow. PI and Low Flow is fluctuating. Plan is 500 NS bolus this will make a total of 1000 ml that I have given  for my shift. Patient denies dizziness or any other complications. His doppler pressure is 70. I gave full report to [facility] for day shift and she will continue to monitor.

## 2018-09-30 NOTE — Progress Notes (Signed)
Urology Inpatient Progress Report  LVAD BLADDER CANCER  Procedure(s): TRANSURETHRAL RESECTION OF BLADDER TUMOR left  RETROGRADE PYELOGRAM CYSTOGRAM  11 Days Post-Op   Intv/Subj: No acute events overnight. Patient is without complaint.  He completed a second round of radiation this morning.  Urine is completely clear.  Principal Problem:   Bladder cancer [facility]) Active Problems:   Atrial tachycardia (HCC)   LVAD (left ventricular assist device) present ([place])   AKI (acute kidney injury) ([place])   Hydronephrosis of right kidney  Current Facility-Administered Medications  Medication Dose Route Frequency Provider Last Rate Last Dose  . 0.9 %  sodium chloride infusion   Intravenous Continuous Oleta Mouse, MD 10 mL/hr at 09/21/18 1830    . acetaminophen (TYLENOL) tablet 650 mg  650 mg Oral Q4H PRN Georgiana Shore, NP      . amiodarone (PACERONE) tablet 200 mg  200 mg Oral BID Georgiana Shore, NP   200 mg at 09/29/18 2115  . atorvastatin (LIPITOR) tablet 80 mg  80 mg Oral QHS Georgiana Shore, NP   80 mg at 09/29/18 2115  . docusate sodium (COLACE) capsule 200 mg  200 mg Oral Daily Georgiana Shore, NP   200 mg at 09/23/18 1028  . feeding supplement (BOOST / RESOURCE BREEZE) liquid 1 Container  1 Container Oral BID BM Bensimhon, Shaune Pascal, MD      . iothalamate meglumine (CYSTO-CONRAY II) 17.2 % solution 250 mL  250 mL Intravesical Once PRN Marton Redwood III, MD      . levothyroxine (SYNTHROID, LEVOTHROID) tablet 150 mcg  150 mcg Oral QAC breakfast Georgiana Shore, NP   150 mcg at 09/30/18 0655  . multivitamin with minerals tablet 1 tablet  1 tablet Oral Daily Georgiana Shore, NP   1 tablet at 09/29/18 [id number]  . pantoprazole (PROTONIX) EC tablet 40 mg  40 mg Oral BID AC Georgiana Shore, NP   40 mg at 09/30/18 0655  . promethazine (PHENERGAN) tablet 12.5 mg  12.5 mg Oral Q6H PRN Georgiana Shore, NP      . sodium chloride flush (NS) 0.9 % injection 10-40 mL  10-40 mL Intracatheter Q12H  Bensimhon, Shaune Pascal, MD   10 mL at 09/29/18 2117  . sodium chloride flush (NS) 0.9 % injection 10-40 mL  10-40 mL Intracatheter PRN Bensimhon, Shaune Pascal, MD      . sotalol (BETAPACE) tablet 80 mg  80 mg Oral BID Georgiana Shore, NP   80 mg at 09/29/18 2116  . temazepam (RESTORIL) capsule 30 mg  30 mg Oral QHS PRN Georgiana Shore, NP   30 mg at 09/29/18 2115  . Warfarin - Pharmacist Dosing Inpatient   Does not apply q1800 Wilburn Cornelia, RPH   1 each at 09/29/18 1746  . zinc sulfate capsule 220 mg  220 mg Oral Daily Bensimhon, Shaune Pascal, MD   220 mg at 09/29/18 0837     Objective: Vital: Vitals:   09/29/18 1958 09/30/18 0345 09/30/18 0500 09/30/18 0731  BP: (!) 81/71   98/84  Pulse: 75     Resp: 18 15    Temp: 97.9 F (36.6 C)   (!) 97.3 F (36.3 C)  TempSrc: Oral   Oral  SpO2: 98%     Weight:   76.4 kg   Height:       I/Os: I/O last 3 completed shifts: In: 1720 [P.O.:720; IV Piggyback:1000] Out: 2992 [Urine:3725]  Physical Exam:  General: Patient is in no apparent distress Lungs: Normal respiratory effort, chest expands symmetrically. GI: The abdomen is soft and nontender without mass. Foley: Draining clear yellow urine Ext: lower extremities symmetric  Lab Results: Recent Labs    09/28/18 0436 09/29/18 0412 09/30/18 0332  WBC 6.7 6.8 5.8  HGB 8.1* 8.3* 7.8*  HCT 26.4* 26.9* 25.7*   Recent Labs    09/28/18 0436 09/29/18 0412 09/30/18 0332  NA 135 136 137  K 4.7 4.5 4.7  CL 107 106 107  CO2 25 25 23   GLUCOSE 100* 98 101*  BUN 25* 25* 28*  CREATININE 1.73* 1.69* 1.83*  CALCIUM 8.2* 8.1* 7.9*   Recent Labs    09/28/18 0436 09/29/18 0412 09/30/18 0332  INR 1.65 1.82 2.02   No results for input(s): LABURIN in the last 72 hours. Results for orders placed or performed during the hospital encounter of 09/17/18  MRSA PCR Screening     Status: None   Collection Time: 09/17/18  1:36 PM  Result Value Ref Range Status   MRSA by PCR NEGATIVE NEGATIVE Final     Comment:        The GeneXpert MRSA Assay (FDA approved for NASAL specimens only), is one component of a comprehensive MRSA colonization surveillance program. It is not intended to diagnose MRSA infection nor to guide or monitor treatment for MRSA infections. Performed at [facility], [place] [address] [place], [place] [address]    *Note: Due to a large number of results and/or encounters for the requested time period, some results have not been displayed. A complete set of results can be found in Results Review.    Studies/Results: No results found.  Assessment: Muscle invasive bladder cancer Persistent gross hematuria, improving Right hydronephrosis likely secondary to malignant obstruction, stable  Procedure(s): TRANSURETHRAL RESECTION OF BLADDER TUMOR left  RETROGRADE PYELOGRAM CYSTOGRAM, 11 Days Post-Op  doing well.  Plan: Agree with radiation treatment.  Defer to Dr. Tammi Klippel as to the number of treatments.  Ideally he will be treated with curative intent given that he has no evidence of disease extravesically.  Discontinue Foley catheter.  Possibly home tomorrow   Link Snuffer, MD Urology 09/30/2018, 10:34 AM

## 2018-10-01 ENCOUNTER — Encounter: Payer: Self-pay | Admitting: Radiation Oncology

## 2018-10-01 ENCOUNTER — Ambulatory Visit (INDEPENDENT_AMBULATORY_CARE_PROVIDER_SITE_OTHER): Payer: Medicare Other

## 2018-10-01 ENCOUNTER — Ambulatory Visit
Admit: 2018-10-01 | Discharge: 2018-10-01 | Disposition: A | Discharge status: Home / Self Care | Payer: Medicare Other | Attending: Radiation Oncology | Admitting: Radiation Oncology

## 2018-10-01 ENCOUNTER — Telehealth: Payer: Self-pay | Admitting: Cardiology

## 2018-10-01 DIAGNOSIS — I5022 Chronic systolic (congestive) heart failure: Secondary | ICD-10-CM | POA: Diagnosis not present

## 2018-10-01 DIAGNOSIS — C679 Malignant neoplasm of bladder, unspecified: Secondary | ICD-10-CM | POA: Diagnosis not present

## 2018-10-01 DIAGNOSIS — C672 Malignant neoplasm of lateral wall of bladder: Secondary | ICD-10-CM | POA: Diagnosis not present

## 2018-10-01 DIAGNOSIS — I255 Ischemic cardiomyopathy: Secondary | ICD-10-CM

## 2018-10-01 LAB — BASIC METABOLIC PANEL
Anion gap: 6 (ref 5–15)
BUN: 23 mg/dL (ref 8–23)
CALCIUM: 8 mg/dL — AB (ref 8.9–10.3)
CO2: 22 mmol/L (ref 22–32)
CREATININE: 1.66 mg/dL — AB (ref 0.61–1.24)
Chloride: 107 mmol/L (ref 98–111)
GFR calc Af Amer: 45 mL/min — ABNORMAL LOW (ref >60)
GFR, EST NON AFRICAN AMERICAN: 39 mL/min — AB (ref >60)
GLUCOSE: 98 mg/dL (ref 70–99)
Potassium: 4.5 mmol/L (ref 3.5–5.1)
SODIUM: 135 mmol/L (ref 135–145)

## 2018-10-01 LAB — PROTIME-INR
INR: 2.1
PROTHROMBIN TIME: 23.3 s — AB (ref 11.4–15.2)

## 2018-10-01 LAB — BPAM RBC
BLOOD PRODUCT EXPIRATION DATE: 201912202359
ISSUE DATE / TIME: 201911261303
Unit Type and Rh: 5100

## 2018-10-01 LAB — CBC
HCT: 29.1 % — ABNORMAL LOW (ref 39.0–52.0)
Hemoglobin: 9 g/dL — ABNORMAL LOW (ref 13.0–17.0)
MCH: 30.1 pg (ref 26.0–34.0)
MCHC: 30.9 g/dL (ref 30.0–36.0)
MCV: 97.3 fL (ref 80.0–100.0)
Platelets: 151 10*3/uL (ref 150–400)
RBC: 2.99 MIL/uL — ABNORMAL LOW (ref 4.22–5.81)
RDW: 17.3 % — AB (ref 11.5–15.5)
WBC: 6.4 10*3/uL (ref 4.0–10.5)
nRBC: 0 % (ref 0.0–0.2)

## 2018-10-01 LAB — TYPE AND SCREEN
ABO/RH(D): O POS
Antibody Screen: NEGATIVE
Unit division: 0

## 2018-10-01 LAB — LACTATE DEHYDROGENASE: LDH: 197 U/L — AB (ref 98–192)

## 2018-10-01 NOTE — Telephone Encounter (Signed)
Spoke with pt and reminded pt of remote transmission that is due today. Pt verbalized understanding.   

## 2018-10-01 NOTE — Progress Notes (Addendum)
DC delayed d/t awaiting [name] (significant other) for ride and DC instructions/follow up appointments. PICC line removed by IV team RN. VSS. Pt declined VAD dressing change by RN, wants [name] to change dressing at home. Dressing kit sent home with patient.   1320: DC instructions given to patient and [name]. Questions answered; educated on follow up appointments, restrictions, and medication regimen. VSS. VAD intact, no complications. Pt switched to home batteries with emergency back up bag with patient.  All belongings sent home with patient at time of DC. Pt escorted by RN via wheelchair to private vehicle driven by [name].

## 2018-10-01 NOTE — Progress Notes (Signed)
Discontinued PICC line. Pt instructed to stay in bed for 30 minutes and leave dressing intact for 24hrs

## 2018-10-01 NOTE — Progress Notes (Addendum)
Patient ID: [name], male   DOB: 1941/06/23, 77 y.o.   MRN: [id number]   Advanced Heart Failure VAD Team Note  PCP-Cardiologist: No primary care provider on file.   Subjective:    Admitted for resection of recurrent bladder tumor. Renal u/s obtained and shows recurrent R hydronephrosis without BOO. There is evidence of recurrent bladder tumor as well.  Ramp echo 11/14 speed turned down to 8600 Went to OR 11/15 for bladder tumor resection. Multiple large tumors. Unable to uncover R ureteral orifice.  Repeat renal u/s showed moderate R hydronephrosis (slightly improved) and new left mild hydro. - Started radiation 11/25  Yesterday he received 2 liters of fluid and 1UPBRBcs. Hgb up from 7.8 >9.   Feels good. No low floes. Foley out. No problems urinating. Hematuria resolved Wants to go home.  [name]  LVAD Interrogation HM 2: Speed: 8600 Flow: 2.9 PI: 5.1  Power: 4 . No low flows over night.   Objective:    Vital Signs:   Temp:  [97.3 F (36.3 C)-98.1 F (36.7 C)] 98.1 F (36.7 C) (11/27 0231) Pulse Rate:  [66-93] 86 (11/27 0231) Resp:  [14-18] 17 (11/27 0231) BP: (65-116)/(49-94) 80/62 (11/27 0231) SpO2:  [95 %-99 %] 98 % (11/27 0231) Weight:  [79.6 kg] 79.6 kg (11/27 0231) Last BM Date: 09/29/18 Mean arterial Pressure 70-80s    Intake/Output:   Intake/Output Summary (Last 24 hours) at 10/01/2018 0800 Last data filed at 10/01/2018 0700 Gross per 24 hour  Intake 1748.67 ml  Output 1925 ml  Net -176.33 ml     Physical Exam   Physical Exam: GENERAL:NAD HEENT: normal  NECK: Supple, JVP flat.  2+ bilaterally, no bruits.  No lymphadenopathy or thyromegaly appreciated.   CARDIAC:  Mechanical heart sounds with LVAD hum present.  LUNGS:  Clear to auscultation bilaterally.  ABDOMEN:  Soft, round, nontender, positive bowel sounds x4.     LVAD exit site: well-healed and incorporated.  Dressing dry and intact.  No erythema or drainage.  Stabilization device present and  accurately applied.  Driveline dressing is being changed daily per sterile technique. EXTREMITIES:  Warm and dry, no cyanosis, clubbing, rash or edema  NEUROLOGIC:  Alert and oriented x 4.  MAE  Telemetry  A paced V sensed 90s.   Labs   Basic Metabolic Panel: Recent Labs  Lab 09/27/18 0321 09/28/18 0436 09/29/18 0412 09/30/18 0332 10/01/18 0239  NA 135 135 136 137 135  K 4.6 4.7 4.5 4.7 4.5  CL 106 107 106 107 107  CO2 23 25 25 23 22   GLUCOSE 101* 100* 98 101* 98  BUN 31* 25* 25* 28* 23  CREATININE 1.86* 1.73* 1.69* 1.83* 1.66*  CALCIUM 8.3* 8.2* 8.1* 7.9* 8.0*  MG  --   --  2.2  --   --     Liver Function Tests: No results for input(s): AST, ALT, ALKPHOS, BILITOT, PROT, ALBUMIN in the last 168 hours. No results for input(s): LIPASE, AMYLASE in the last 168 hours. No results for input(s): AMMONIA in the last 168 hours.  CBC: Recent Labs  Lab 09/27/18 0321 09/28/18 0436 09/29/18 0412 09/30/18 0332 10/01/18 0239  WBC 7.7 6.7 6.8 5.8 6.4  HGB 8.4* 8.1* 8.3* 7.8* 9.0*  HCT 27.4* 26.4* 26.9* 25.7* 29.1*  MCV 97.2 97.4 97.5 98.1 97.3  PLT 135* 128* 139* 149* 151    INR: Recent Labs  Lab 09/27/18 0321 09/28/18 0436 09/29/18 0412 09/30/18 0332 10/01/18 0239  INR 1.50 1.65  1.82 2.02 2.10    Imaging   No results found.   Medications:     Scheduled Medications: . sodium chloride   Intravenous Once  . amiodarone  200 mg Oral BID  . atorvastatin  80 mg Oral QHS  . docusate sodium  200 mg Oral Daily  . feeding supplement  1 Container Oral BID BM  . levothyroxine  150 mcg Oral QAC breakfast  . multivitamin with minerals  1 tablet Oral Daily  . pantoprazole  40 mg Oral BID AC  . sodium chloride flush  10-40 mL Intracatheter Q12H  . sotalol  80 mg Oral BID  . Warfarin - Pharmacist Dosing Inpatient   Does not apply q1800  . zinc sulfate  220 mg Oral Daily    Infusions: . sodium chloride 10 mL/hr at 09/21/18 1830    PRN Medications: acetaminophen,  iothalamate meglumine, promethazine, sodium chloride flush, temazepam   Patient Profile   [name] is a 76 y.o. 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 He is s/p HM II LVAD implant for destination therapy on October 19, 2013. VAD implantation was complicated by VT, syncope, AF/Aflutter and RHF. Required short term milrinone.   Admitted for heparin bridge for bladder tumor resection on 09/19/18.  Assessment/Plan:    1. 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  - Had 2 BCG infusions but discontinued due to inability to tolerate - 7/12 s/p TURB with right ureteral stent placement.  - Renal u/s 09/18/18 with recurrent R hydronephrosis without BOO. There is evidence of recurrent bladder tumor as well - Underwent recurrent bladder tumor resection 09/19/18 with Dr Gloriann Loan. Found to be large fast-growing tumor with obstruction of right ureteral orifice - Repeat renal u/s 09/20/18 shows moderate R hydronephrosis (slightly improved) and new left mild hydro.  - CT ab/pelvis 09/22/18 no metastatic or locally invasive disease. 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  - Foley removed 11/19 but he could not void so foley was replaced.  - Foley removed 11/26.   - Continue coumadin. INR 2.0 .   - Started radiation 11/25, 11/26, and will have another treatment today.   He has f/u with Dr Alfredo Bach office in January.  -Completed ceftriaxone.  2. Chronic systolic HF s/p HM-II LVAD 10/2014  - VAD interrogated personally. Having low flow this morning.  -  INR 1.8-2.3. Continue low-dose coumadin, INR  2.1   - LDH 197  3. 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 Required HD x1 - Creatininebaseline 1.4-1.5 - Creatinine peaked 2.3 in setting of R> L hydronephrosis.   - Creatinine stable 1.6 - Bladder tumor debulked but unable to find R ureteral orifice. Discussed possibility of perc tube with Urology but no indication currently.  - Mr. Stenzel says he would not want nephrostomy. 4 h/o.RecurrentVTwith hypotension and low flow alarms on VAD: Underwent DC-CV 9/17, 08/17/17, and 04/01/18. S/p emergent DC-CV in ER 07/09/18  - Had several low flow alarms on 11/9, 11/4, and 10/31. ICD shows several runs of AT 10/24-10/28, all lasting about an hour. Nothing that correlates with low flow alarms.   - Quiescent VT onSotalol80mg BIDand amio - QTC slightly prolonged but stable. Previously reviewed with Dr. Caryl Comes and felt OK.  - Over night he had NSVT . Ask medtronic to evaluate.  -  Keep K > 4.0 Mg > 2.0.  -Stable.   5. Atrial arrhythmias: Atrial fibrillation, atrial tachycardia. S/p DC-CV 06/04/14, DC-CV 03/02/15 and 01/19/16.  -Device interrogation this admit shows multiple brief runs atrial tach 6. H/o Recurrent GIB - Most recent bleed 12/18. hgb 6.5, Transfused 5u. EGD with 2 AVMs treated with APC - No recent GI bleeding 7. Anemia  Received 1UPRBC 11/26. Hgb with appropriate rist 7.8>9.   I reviewed the LVAD parameters from today, and compared the results to the patient's prior recorded data.  No programming changes were made.  The LVAD is functioning within specified parameters.  The patient performs LVAD self-test daily.  LVAD interrogation was negative for any significant power changes, alarms or PI events/speed drops.  LVAD equipment check completed and is in good working order.  Back-up equipment present.   LVAD education done on emergency procedures and precautions and reviewed exit site care.   Length of Stay: [place], NP 10/01/2018, 8:00 AM  VAD Team --- VAD ISSUES ONLY--- Pager [phone] (7am - 7am)  Advanced Heart Failure Team  Pager [phone] (M-F; 7a - 4p)  Please contact [facility] for night-coverage after hours (4p -7a ) and  weekends on [email or web address]  Patient seen and examined with the above-signed Advanced Practice Provider and/or Housestaff. I personally reviewed laboratory data, imaging studies and relevant notes. I independently examined the patient and formulated the important aspects of the plan. I have edited the note to reflect any of my changes or salient points. I have personally discussed the plan with the patient and/or family.  Doing well. Hgb improved af RBCs. Hematuria resolved now with XRT. INR 2.1. VAD interrogated personally. Parameters stable. No further low flows.   Will get XRT today then will d/c home later this afternoon with close outpatient f/u. It appears that further XRT may be on hold until January. Given rapidity of tumor regrowth previously will discuss timing of further XRT with Rad-Onc team.   Glori Bickers, MD  8:29 AM

## 2018-10-01 NOTE — Care Management Note (Signed)
Case Management Note  Patient Details  Name: [name] MRN: [id number] Date of Birth: [birth year]  Subjective/Objective:   Pt admitted with Bladder CA - pt is now s/p TURP procedure.  Pt has LVAD                 Action/Plan:  PTA independent from home with wife.     Expected Discharge Date:  10/01/18               Expected Discharge Plan:     In-House Referral:  Clinical Social Work  Discharge planning Services  CM Consult  Post Acute Care Choice:    Choice offered to:     DME Arranged:    DME Agency:     HH Arranged:    HH Agency:     Status of Service:  In process, will continue to follow  If discussed at Long Length of Stay Meetings, dates discussed:    Additional Comments: 10/01/2018  Pt deemed medically stable for discharge.  HF team and pt denied [place] needs.  Pt will follow up closely with HF team and has appts for ongoing CA treatment.  No CM Needs determined at the time of discharge  09/23/18 Pt has extensive CA and currently refusing nephrostomy tube.  Palliative care scheduled to discuss [place] with pt/family 11/19.  CM will continue to follow [name], RN 10/01/2018, 11:22 AM

## 2018-10-01 NOTE — Progress Notes (Signed)
LVAD Coordinator Rounding Note:  Admitted on 09/17/18 for heparin bridge for bladder tumor resection scheduled on Friday 11/15.   HM II Implanted 10/19/13 by Dr. Cyndia Bent under Destination Therapy criteria.  Pt scheduled for third radiation therapy at 9:00 am; [facility] scheduled for transportation.   VAD coordinator accompanied pt to [facility], pt tolerated procedure without difficulty. Dr. Tammi Klippel spoke with patient and [name] ([place]) after radiation about future options. Pt has appt with Dr. Johny Shears PA on 11/11/17. [name] concerned about waiting so long, Dr. Haroldine Laws aware and will reach out to Dr. Tammi Klippel.   Vital signs: Temp:  97.5 HR: 95 Doppler Pressure:80 Automatic cuff:  Not done O2 Sat: 100%on RA  Weight:  170.5>168.4>167.9>163.8>172.1>172.6>172.1>168.2>169.9>170.8>168.4175.9  lbs  LVAD interrogation reveals:  Speed: 8600 Flow:2.8 Power: 3.8w PI: 5.4  Alarms:  8 low flow alarms over last 24 hrs  Events:rare PI  Fixed speed: 8600 Low speed limit: 8000  Drive Line: Weekly dressings performed by bedside nurse or SO [name]. Next dressing change due today; will be changed by [name] per pt request. Weekly dressing kit at bedside.   Labs:  LDH trend: 261>219>208>199>193>201>184>165>168>170>172>167>197  INR trend:  2.5>2.18>1.92>1.34>1.35>1.35>1.33>1.48>1.5>1.65>1.82>2.02>2.10  Creat trend: 1.55>2.18>2.26>1.97>2.03>1.88>2.09>1.89>1.86>1.73>1.69>1.83>1.66  Blood products this admission: 09/19/18- 2 units FFP 09/25/18- 1 u/PRBC 09/30/18 - 1 unit PRBC  Anticoagulation Plan: - INR Goal: 2.0 - 2.5  - ASA Dose: no ASA (removed 02/2017 due to GI bleed) - Heparin gtt > stopped 11/20 due to hematuria  Device: -Dual Medtronic ICD - VF therapies: on at 200 bpm - AT/AF monitor on: 140 bpm  - Last device check 09/30/18 per Tomi Bamberger; underlying rhythm [place]  Significant Events on VAD Support:  09/01/14>>ureteral stone with cystoscopy and flexible ureteroscopy  with lithotripsy and basket extraction 02/2015 >>GIB with 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 Started on Plavix 3/17>>Lumbar fusion L3-L5 9/17>>VT with DC-CV; amio increased 10/17>>ramp with RV failure; speed decreased to 8800 02/2017> GIB (removed ASA, scopes negative) 03/2017>percutaneous lead repair after pump stops  10/18>>Slow VT. Cardioverted in ED and amiodarone uptitrated 12/18>>melena with scope. Hgb 6.5. EGD with 2 AVMs with APC 12/18>>recurrent hematuria. 6 mm left ureteral stone 1/19>>7 mm left ureteral stone removal with laser lithotripsy; left ureteral stent. Resection of 6 cm bladder tumor  Plan/Recommendations:  1. Please call VAD pager if any issues with VAD equipment or questions, concerns. 2. Pt is to be discharged home today; [name] is aware and will be here this afternoon to pick him up.   Zada Girt RN [place] Coordinator  Office: [phone]  24/7 Pager: [phone]

## 2018-10-01 NOTE — Progress Notes (Signed)
Urology Inpatient Progress Report  LVAD BLADDER CANCER  Procedure(s): TRANSURETHRAL RESECTION OF BLADDER TUMOR left  RETROGRADE PYELOGRAM CYSTOGRAM  12 Days Post-Op   Intv/Subj: No acute events overnight. Patient is without complaint. Voiding well.  Creatinine stable.  Hemoglobin stable.  He has no complaints.  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 0.9 %  sodium chloride infusion (Manually program via Guardrails IV Fluids)   Intravenous Once Clegg, Amy D, NP      . 0.9 %  sodium chloride infusion   Intravenous Continuous Oleta Mouse, MD 10 mL/hr at 09/21/18 1830    . acetaminophen (TYLENOL) tablet 650 mg  650 mg Oral Q4H PRN Georgiana Shore, NP      . amiodarone (PACERONE) tablet 200 mg  200 mg Oral BID Georgiana Shore, NP   200 mg at 09/30/18 2115  . atorvastatin (LIPITOR) tablet 80 mg  80 mg Oral QHS Georgiana Shore, NP   80 mg at 09/30/18 2115  . docusate sodium (COLACE) capsule 200 mg  200 mg Oral Daily Georgiana Shore, NP   200 mg at 09/23/18 1028  . feeding supplement (BOOST / RESOURCE BREEZE) liquid 1 Container  1 Container Oral BID BM Bensimhon, Shaune Pascal, MD      . iothalamate meglumine (CYSTO-CONRAY II) 17.2 % solution 250 mL  250 mL Intravesical Once PRN Marton Redwood III, MD      . levothyroxine (SYNTHROID, LEVOTHROID) tablet 150 mcg  150 mcg Oral QAC breakfast Georgiana Shore, NP   150 mcg at 10/01/18 0938  . multivitamin with minerals tablet 1 tablet  1 tablet Oral Daily Georgiana Shore, NP   1 tablet at 09/30/18 1059  . pantoprazole (PROTONIX) EC tablet 40 mg  40 mg Oral BID AC Georgiana Shore, NP   40 mg at 10/01/18 1829   . promethazine (PHENERGAN) tablet 12.5 mg  12.5 mg Oral Q6H PRN Georgiana Shore, NP      . sodium chloride flush (NS) 0.9 % injection 10-40 mL  10-40 mL Intracatheter Q12H Bensimhon, Shaune Pascal, MD   10 mL at 09/30/18 2116  . sodium chloride flush (NS) 0.9 % injection 10-40 mL  10-40 mL Intracatheter PRN Bensimhon, Shaune Pascal, MD      . sotalol (BETAPACE) tablet 80 mg  80 mg Oral BID Georgiana Shore, NP   80 mg at 09/30/18 2115  . temazepam (RESTORIL) capsule 30 mg  30 mg Oral QHS PRN Georgiana Shore, NP   30 mg at 09/30/18 2115  . Warfarin - Pharmacist Dosing Inpatient   Does not apply q1800 Wilburn Cornelia, RPH   1 each at 09/29/18 1746  . zinc sulfate capsule 220 mg  220 mg Oral Daily Bensimhon, Shaune Pascal, MD   220 mg at 09/30/18 1059     Objective: Vital: Vitals:   09/30/18 1750 09/30/18 1912 09/30/18 2336 10/01/18 0231  BP: (!) 108/94  (!) 65/55 (!) 80/62  Pulse:  93 91 86  Resp:  18 18 17   Temp:  (!) 97.3 F (36.3 C) (!) 97.3 F (36.3 C) 98.1 F (36.7 C)  TempSrc:  Oral Oral Oral  SpO2:  98% 99% 98%  Weight:    79.6 kg  Height:       I/Os: I/O last 3 completed shifts: In: 3248.7 [P.O.:840; I.V.:520; Blood:388.7; IV Piggyback:1500] Out: 7209 [Urine:4275]  Physical Exam:  General: Patient is in no apparent distress Lungs: Normal respiratory effort, chest expands symmetrically. GI: The abdomen is soft and nontender without mass. Ext: lower extremities symmetric  Lab Results: Recent Labs    09/29/18 0412 09/30/18 0332 10/01/18 0239  WBC 6.8 5.8 6.4  HGB 8.3* 7.8* 9.0*  HCT 26.9* 25.7* 29.1*   Recent Labs    09/29/18 0412 09/30/18 0332 10/01/18 0239  NA 136 137 135  K 4.5 4.7 4.5  CL 106 107 107  CO2 25 23 22   GLUCOSE 98 101* 98  BUN 25* 28* 23  CREATININE 1.69* 1.83* 1.66*  CALCIUM 8.1* 7.9* 8.0*   Recent Labs    09/29/18 0412 09/30/18 0332 10/01/18 0239  INR 1.82 2.02 2.10   No results for input(s): LABURIN in the last 72 hours. Results for orders  placed or performed during the hospital encounter of 09/17/18  MRSA PCR Screening     Status: None   Collection Time: 09/17/18  1:36 PM  Result Value Ref Range Status   MRSA by PCR NEGATIVE NEGATIVE Final    Comment:        The GeneXpert MRSA Assay (FDA approved for NASAL specimens only), is one component of a comprehensive MRSA colonization surveillance program. It is not intended to diagnose MRSA infection nor to guide or monitor treatment for MRSA infections. Performed at [facility], [place] [address], [place], [place] [address]    *Note: Due to a large number of results and/or encounters for the requested time period, some results have not been displayed. A complete set of results can be found in Results Review.    Studies/Results: No results found.  Assessment: Muscle invasive bladder cancer Right hydronephrosis Acute renal insufficiency, improving/stable Gross hematuria, resolved  Procedure(s): TRANSURETHRAL RESECTION OF BLADDER TUMOR left  RETROGRADE PYELOGRAM CYSTOGRAM, 12 Days Post-Op  doing well.  Plan: Radiation treatment per Dr. Tammi Klippel.  Stable from urological standpoint for discharge.   Link Snuffer, MD Urology 10/01/2018, 7:53 AM

## 2018-10-01 NOTE — Care Management Important Message (Signed)
Important Message  Patient Details  Name: YEE JOSS MRN: [id number] Date of Birth: [birth year]   Medicare Important Message Given:  Yes    Barb Merino Carles Florea 10/01/2018, 3:46 PM

## 2018-10-05 NOTE — Progress Notes (Signed)
  Radiation Oncology         [phone] ________________________________  Name: [name] MRN: [id number]  Date: 10/01/2018  DOB: Jun 22, 1941  End of Treatment Note  Diagnosis:   77 yo man with hematuria from bladder cancer     Indication for treatment:  Palliation, Hemostasis       Radiation treatment dates:   11/25-27/19  Site/dose:   The whole bladder was treated to 15 Gy in 3 fractions  Beams/energy:   A four-field box was treated with 15 MV photons  Narrative: The patient tolerated radiation treatment relatively well.   His hematuria diminished prior to radiation and did not return.  Plan: The patient has completed radiation treatment. The patient will return to radiation oncology clinic for routine followup in one month. I advised him to call or return sooner if he has any questions or concerns related to his recovery or treatment. ________________________________  Sheral Apley. Tammi Klippel, M.D.

## 2018-10-06 ENCOUNTER — Encounter (HOSPITAL_COMMUNITY): Payer: Medicare Other

## 2018-10-06 NOTE — Progress Notes (Signed)
Remote ICD transmission.   

## 2018-10-07 ENCOUNTER — Ambulatory Visit (HOSPITAL_COMMUNITY): Payer: Self-pay | Admitting: Pharmacist

## 2018-10-07 LAB — POCT INR: INR: 3 (ref 2.0–3.0)

## 2018-10-14 ENCOUNTER — Ambulatory Visit (HOSPITAL_COMMUNITY): Payer: Self-pay | Admitting: Pharmacist

## 2018-10-14 LAB — POCT INR: INR: 2.5 (ref 2.0–3.0)

## 2018-10-16 ENCOUNTER — Ambulatory Visit
Admission: RE | Admit: 2018-10-16 | Discharge: 2018-10-16 | Disposition: A | Discharge status: Home / Self Care | Payer: Medicare Other | Source: Ambulatory Visit | Attending: Radiation Oncology | Admitting: Radiation Oncology

## 2018-10-16 ENCOUNTER — Other Ambulatory Visit: Payer: Self-pay

## 2018-10-16 ENCOUNTER — Ambulatory Visit
Admission: RE | Admit: 2018-10-16 | Discharge: 2018-10-16 | Disposition: A | Discharge status: Home / Self Care | Payer: Medicare Other | Source: Ambulatory Visit | Attending: Urology | Admitting: Urology

## 2018-10-16 ENCOUNTER — Encounter: Payer: Self-pay | Admitting: Urology

## 2018-10-16 VITALS — BP 86/74 | HR 88 | Temp 97.7°F | Resp 20 | Ht 72.0 in | Wt 177.2 lb

## 2018-10-16 DIAGNOSIS — C672 Malignant neoplasm of lateral wall of bladder: Secondary | ICD-10-CM

## 2018-10-16 DIAGNOSIS — C679 Malignant neoplasm of bladder, unspecified: Secondary | ICD-10-CM

## 2018-10-16 DIAGNOSIS — Z510 Encounter for antineoplastic radiation therapy: Secondary | ICD-10-CM | POA: Insufficient documentation

## 2018-10-16 NOTE — Progress Notes (Signed)
[place]         [phone] ________________________________  Initial inpatient Consultation  Name: [name] MRN: [id number]  Date: 09/25/2018  DOB: 1940/11/14  REFERRING PHYSICIAN: Bensimhon, Shaune Pascal, MD, Dr. Link Snuffer  DIAGNOSIS: 77 y/o male with muscle invasive bladder cancer    ICD-10-CM   1. Malignant neoplasm of urinary bladder, unspecified site ([place]) C67.9   2. Hematuria, gross R31.0     HISTORY OF PRESENT ILLNESS::[name] is a 77 y.o. male, established urology patient with known history of urolithiasis, currently under the care of Dr. Gloriann Loan for bladder cancer, initially diagnosed in 12/04/2017. Final pathology from a TURBT procedure on 12/04/2017 showed pT1, high grade urothelial cell carcinoma. He initially declined BCG bladder installations as recommended by Dr. Gloriann Loan and instead opted for surveillance bladder cystoscopy in 3 months.  He later decided to start BCG treatments and had his first instillation in March 2019 but did not tolerate it well due to flulike symptoms and intermittent gross hematuria so he declined further treatments.  Repeat cystoscopy was performed on 04/10/2018 revealing a recurrent 2 cm bladder tumor on the posterior bladder wall as well as a small tumor at the trigone.  He underwent repeat TURBT on 05/16/18, with final pathology revealing recurrent noninvasive, high grade urothelial cell carcinoma. He elected to continue under active surveillance with cystoscopies every 3 months. He most recently presented back to Dr. Gloriann Loan on 08/28/18 for cystoscopy, and this showed multiple recurrent bladder tumors, with the largest measuring approximately 2 cm on the right lateral wall.  He has an LVAD device and therefore was admitted to [facility] on 09/17/2018 for heparin bridging prior to a scheduled repeat TURBT on 09/19/18.  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  Due to persistent, severe gross hematuria since the time of his procedure requiring continuous bladder irrigation as well as manual bladder irrigation for blood clots, he opted to proceed with a short of course of palliative radiotherapy for purposes of hemostasis.  Given the fact that he has an LVAD, he must remain on anticoagulation which was felt to contribute to the persistent gross hematuria.  Therefore, we were initially asked, by Dr. Gloriann Loan, to consult on 09/25/18 for consideration of palliative radiation to assist with hemostasis and management of his disease.  The patient elected to proceed with a short course of 3 fractions of XRT, which was completed 10/01/18 and very well tolerated.  He has not had further gross hematuria.    He presents back today for consideration of additional radiotherapy, with or without the addition of radiosensitizing chemotherapy, in an effort to provide more durable control of his disease.  Dr. Tammi Klippel has been in conversation with the patient's cardiologist, Dr. Haroldine Laws, who agrees that the patient's prognosis regarding his LVAD is greater than 2 years which makes a consideration for additional radiotherapy quite reasonable.  PREVIOUS RADIATION THERAPY: Yes- Palliation, for Hemostasis      09/29/18- 10/01/18:  The whole bladder was treated to 15 Gy in 3 fractions   Past Medical History:  Diagnosis Date  . Arthritis   . Automatic implantable cardioverter-defibrillator in situ    a. s/p Medtronic Evera device serial [id number]    . Bradycardia   . CAD (coronary artery disease)    a. s/p MI 1982;  s/p DES to CFX 2011;  s/p NSTEMI 4/12 in setting of VTach;  cath 7/12:  LM ok,  LAD mid 30-40%, Dx's with 40%, mCFX stent patent, prox to mid RCA occluded with R to R and L to R collats.  Medical Tx was continued  . Chronic systolic heart failure ([place])    a. s/p LVAD 10/2013 for destination therapy  . CKD (chronic kidney disease)   . CVD (cerebrovascular  disease)    a. s/p L CEA 2008  . Cytopenia   . Dysrhythmia    AFIB  . History of GI bleed    a. on ASA- started on plavix during 10/2015 admission for CVA  . HLD (hyperlipidemia)   . HTN (hypertension)   . Hypothyroidism   . Inappropriate therapy from implantable cardioverter-defibrillator    a. ATP for sinus tach SVT wavelet identified SVT but was no  passive (2014)  . Ischemic cardiomyopathy   . Melanoma of ear ([place])    a. L Ear   . Myocardial infarction ([place])    1992  . PAF (paroxysmal atrial fibrillation) ([place])   . PVD (peripheral vascular disease) ([place])    a. 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 Shortness of breath dyspnea    W/ EXERTION   . Sleep apnea    NO CPAP NOW  HE SENT IT BACK YRS AGO  . Stroke [facility])    a. 10/2015 admission   . Ventricular tachycardia ([place])    a. s/p AICD;   h/o ICD shock;  Amiodarone Rx. S/P ablation in 2012  :   Past Surgical History:  Procedure Laterality Date  . BACK SURGERY    . CARDIAC CATHETERIZATION     "several" (05/25/2013)  . CARDIAC DEFIBRILLATOR PLACEMENT  2003; 2005; 05/25/2013   2014: Medtronic Evera device serial number [id number]  . CARDIAC ELECTROPHYSIOLOGY STUDY AND ABLATION  2012  . CARDIOVERSION N/A 10/15/2013   Procedure: CARDIOVERSION;  Surgeon: Jolaine Artist, MD;  Location: [place];  Service: Cardiovascular;  Laterality: N/A;  . CARDIOVERSION N/A 11/04/2013   Procedure: CARDIOVERSION;  Surgeon: Larey Dresser, MD;  Location: [place];  Service: Cardiovascular;  Laterality: N/A;  . CARDIOVERSION N/A 06/04/2014   Procedure: CARDIOVERSION;  Surgeon: Jolaine Artist, MD;  Location: [facility] ENDOSCOPY;  Service: Cardiovascular;  Laterality: N/A;  . CARDIOVERSION N/A 03/02/2015   Procedure: CARDIOVERSION;  Surgeon: Jolaine Artist, MD;  Location: [facility] ENDOSCOPY;  Service: Cardiovascular;  Laterality: N/A;  . CARDIOVERSION N/A  01/19/2016   Procedure: CARDIOVERSION;  Surgeon: Larey Dresser, MD;  Location: [place];  Service: Cardiovascular;  Laterality: N/A;  . CARDIOVERSION N/A 06/10/2018   Procedure: CARDIOVERSION;  Surgeon: Jolaine Artist, MD;  Location: [facility] ENDOSCOPY;  Service: Cardiovascular;  Laterality: N/A;  . CARDIOVERSION N/A 07/02/2018   Procedure: CARDIOVERSION;  Surgeon: Jolaine Artist, MD;  Location: [place];  Service: Cardiovascular;  Laterality: N/A;  . CAROTID ENDARTERECTOMY Left ~ 2007  . CATARACT EXTRACTION W/ INTRAOCULAR LENS  IMPLANT, BILATERAL Bilateral 2000  . CHOLECYSTECTOMY  12/15/?2010  . COLONOSCOPY WITH PROPOFOL N/A 01-22-202018   Procedure: COLONOSCOPY WITH PROPOFOL;  Surgeon: Ronald Lobo, MD;  Location: [place];  Service: Endoscopy;  Laterality: N/A;  . CORONARY ANGIOPLASTY  1992  . CORONARY ANGIOPLASTY WITH STENT PLACEMENT     "last one was 11/2012  (05/25/2013)  . CYSTOGRAM N/A 09/19/2018   Procedure: CYSTOGRAM;  Surgeon: Lucas Mallow, MD;  Location: [facility]  OR;  Service: Urology;  Laterality: N/A;  . CYSTOSCOPY W/ RETROGRADES Bilateral 05/16/2018   Procedure: CYSTOSCOPY WITH RETROGRADE PYELOGRAM;  Surgeon: Lucas Mallow, MD;  Location: [place];  Service: Urology;  Laterality: Bilateral;  . CYSTOSCOPY WITH RETROGRADE PYELOGRAM, URETEROSCOPY AND STENT PLACEMENT Left 08/09/2014   Procedure: CYSTOSCOPY WITH RETROGRADE PYELOGRAM, URETEROSCOPY AND STENT PLACEMENT;  Surgeon: Bernestine Amass, MD;  Location: [facility];  Service: Urology;  Laterality: Left;  . CYSTOSCOPY WITH RETROGRADE PYELOGRAM, URETEROSCOPY AND STENT PLACEMENT Left 09/01/2014   Procedure: CYSTOSCOPY WITH URETEROSCOPY AND STENT REMOVAL;  Surgeon: Bernestine Amass, MD;  Location: [facility];  Service: Urology;  Laterality: Left;  . CYSTOSCOPY WITH RETROGRADE PYELOGRAM, URETEROSCOPY AND STENT PLACEMENT Left 09/20/2014   Procedure: CYSTOSCOPY WITH RETROGRADE PYELOGRAM, URETEROSCOPY AND STENT PLACEMENT, stone removal;   Surgeon: Bernestine Amass, MD;  Location: [facility];  Service: Urology;  Laterality: Left;  . CYSTOSCOPY WITH RETROGRADE PYELOGRAM, URETEROSCOPY AND STENT PLACEMENT Bilateral 12/04/2017   Procedure: CYSTOSCOPY WITH BILATERAL RETROGRADE PYELOGRAM, LEFT URETEROSCOPY AND STENT PLACEMENT;  Surgeon: Lucas Mallow, MD;  Location: [place];  Service: Urology;  Laterality: Bilateral;  . CYSTOSCOPY WITH RETROGRADE PYELOGRAM, URETEROSCOPY AND STENT PLACEMENT Left 09/19/2018   Procedure: left  RETROGRADE PYELOGRAM;  Surgeon: Lucas Mallow, MD;  Location: [place];  Service: Urology;  Laterality: Left;  . DOPPLER ECHOCARDIOGRAPHY  2011  . ELBOW ARTHROSCOPY Right 1990's  . ELECTROPHYSIOLOGIC STUDY N/A 07/19/2016   Procedure: Cardioversion;  Surgeon: Evans Lance, MD;  Location: [place] CV LAB;  Service: Cardiovascular;  Laterality: N/A;  . ENDARTERECTOMY Right 01/13/2016   Procedure: RIGHT CAROTID ARTERY ENDARTERECTOMY;  Surgeon: Mal Misty, MD;  Location: [place];  Service: Vascular;  Laterality: Right;  . ENTEROSCOPY Left 02/23/2017   Procedure: ENTEROSCOPY;  Surgeon: Ronald Lobo, MD;  Location: [facility] ENDOSCOPY;  Service: Endoscopy;  Laterality: Left;  . ENTEROSCOPY N/A 10/17/2017   Procedure: ENTEROSCOPY;  Surgeon: Yetta Flock, MD;  Location: [facility] ENDOSCOPY;  Service: Gastroenterology;  Laterality: N/A;  LVAD pt.    . ESOPHAGOGASTRODUODENOSCOPY N/A 02/15/2015   Procedure: ESOPHAGOGASTRODUODENOSCOPY (EGD);  Surgeon: Carol Ada, MD;  Location: [facility] ENDOSCOPY;  Service: Endoscopy;  Laterality: N/A;  LVAD  . EYE SURGERY     VITRECTOMY  LEFT 05/2012  . FEMORAL-POPLITEAL BYPASS GRAFT Right 2011  . FEMORAL-POPLITEAL BYPASS GRAFT Left 05/2002   Archie Endo 05/06/2002 (05/25/2013)  . HEEL SPUR SURGERY Right 1990's  . HOLMIUM LASER APPLICATION Left 42/35/3614   Procedure: HOLMIUM LASER APPLICATION;  Surgeon: Bernestine Amass, MD;  Location[facility];  Service: Urology;  Laterality: Left;  . HOLMIUM LASER APPLICATION  Left 4/31/5400   Procedure: HOLMIUM LASER APPLICATION;  Surgeon: Lucas Mallow, MD;  Location: [place];  Service: Urology;  Laterality: Left;  . IMPLANTABLE CARDIOVERTER DEFIBRILLATOR GENERATOR CHANGE N/A 05/25/2013   Procedure: IMPLANTABLE CARDIOVERTER DEFIBRILLATOR GENERATOR CHANGE;  Surgeon: Deboraha Sprang, MD;  Location: [facility] CATH LAB;  Service: Cardiovascular;  Laterality: N/A;  . INSERTION OF IMPLANTABLE LEFT VENTRICULAR ASSIST DEVICE N/A 10/19/2013   Procedure: INSERTION OF IMPLANTABLE LEFT VENTRICULAR ASSIST DEVICE;  Surgeon: Gaye Pollack, MD;  Location: [place];  Service: Open Heart Surgery;  Laterality: N/A;  CIRC ARREST  NITRIC OXIDE  MEDTRONIC ICD  . INTRAOPERATIVE TRANSESOPHAGEAL ECHOCARDIOGRAM N/A 10/19/2013   Procedure: INTRAOPERATIVE TRANSESOPHAGEAL ECHOCARDIOGRAM;  Surgeon: Gaye Pollack, MD;  Location: [facility] OR;  Service: Open Heart Surgery;  Laterality: N/A;  . IR FLUORO GUIDE CV  LINE RIGHT  12/10/2017  . IR US GUIDE VASC ACCESS RIGHT  12/10/2017  . KNEE ARTHROSCOPY Bilateral 1990's  . LEAD REVISION N/A 06/05/2013   Procedure: LEAD REVISION;  Surgeon: Thompson Grayer, MD;  Location: [facility] CATH LAB;  Service: Cardiovascular;  Laterality: N/A;  . LEFT HEART CATHETERIZATION WITH CORONARY ANGIOGRAM N/A 11/24/2012   Procedure: LEFT HEART CATHETERIZATION WITH CORONARY ANGIOGRAM;  Surgeon: Peter M Martinique, MD;  Location: [facility] CATH LAB;  Service: Cardiovascular;  Laterality: N/A;  . LIPOMA EXCISION Left 2013   "near ear" (05/25/2013)  . [place]; 2000's  . PATCH ANGIOPLASTY Right 01/13/2016   Procedure: WITH  PATCH ANGIOPLASTY;  Surgeon: Mal Misty, MD;  Location: [place];  Service: Vascular;  Laterality: Right;  . RENAL ARTERY BYPASS Bilateral 2003  . REVASCULARIZATION / IN-SITU GRAFT LEG    . RIGHT HEART CATH N/A 11/29/2017   Procedure: RIGHT HEART CATH;  Surgeon: Jolaine Artist, MD;  Location: [place] CV LAB;  Service: Cardiovascular;  Laterality: N/A;  . RIGHT HEART  CATHETERIZATION N/A 09/17/2013   Procedure: RIGHT HEART CATH;  Surgeon: Jolaine Artist, MD;  Location: [facility] CATH LAB;  Service: Cardiovascular;  Laterality: N/A;  . RIGHT HEART CATHETERIZATION N/A 10/14/2013   Procedure: RIGHT HEART CATH;  Surgeon: Jolaine Artist, MD;  Location: [facility] CATH LAB;  Service: Cardiovascular;  Laterality: N/A;  . RIGHT HEART CATHETERIZATION N/A 11/16/2013   Procedure: RIGHT HEART CATH;  Surgeon: Jolaine Artist, MD;  Location: [facility] CATH LAB;  Service: Cardiovascular;  Laterality: N/A;  . RIGHT HEART CATHETERIZATION N/A 07/13/2014   Procedure: RIGHT HEART CATH;  Surgeon: Jolaine Artist, MD;  Location: [facility] CATH LAB;  Service: Cardiovascular;  Laterality: N/A;  . TEE WITHOUT CARDIOVERSION N/A 11/04/2013   Procedure: TRANSESOPHAGEAL ECHOCARDIOGRAM (TEE);  Surgeon: Larey Dresser, MD;  Location: [place];  Service: Cardiovascular;  Laterality: N/A;  . TEE WITHOUT CARDIOVERSION N/A 03/02/2015   Procedure: TRANSESOPHAGEAL ECHOCARDIOGRAM (TEE);  Surgeon: Jolaine Artist, MD;  Location: [facility] ENDOSCOPY;  Service: Cardiovascular;  Laterality: N/A;  . TEE WITHOUT CARDIOVERSION N/A 01/19/2016   Procedure: TRANSESOPHAGEAL ECHOCARDIOGRAM (TEE);  Surgeon: Larey Dresser, MD;  Location: [place];  Service: Cardiovascular;  Laterality: N/A;  . TONSILLECTOMY  1946  . TRANSURETHRAL RESECTION OF BLADDER TUMOR N/A 12/04/2017   Procedure: TRANSURETHRAL RESECTION OF BLADDER TUMOR (TURBT);  Surgeon: Lucas Mallow, MD;  Location: [place];  Service: Urology;  Laterality: N/A;  . TRANSURETHRAL RESECTION OF BLADDER TUMOR N/A 05/16/2018   Procedure: TRANSURETHRAL RESECTION OF BLADDER TUMOR (TURBT);  Surgeon: Lucas Mallow, MD;  Location: [place];  Service: Urology;  Laterality: N/A;  . TRANSURETHRAL RESECTION OF BLADDER TUMOR WITH MITOMYCIN-C N/A 09/19/2018   Procedure: TRANSURETHRAL RESECTION OF BLADDER TUMOR;  Surgeon: Lucas Mallow, MD;  Location: [place];  Service: Urology;   Laterality: N/A;  . TUMOR EXCISION Left 1960's   "fatty tumor" (05/25/2013)  . V-TACH ABLATION N/A 09/12/2011   Procedure: V-TACH ABLATION;  Surgeon: Evans Lance, MD;  Location: [facility] CATH LAB;  Service: Cardiovascular;  Laterality: N/A;  . V-TACH ABLATION N/A 06/08/2013   Procedure: V-TACH ABLATION;  Surgeon: Evans Lance, MD;  Location: [facility] CATH LAB;  Service: Cardiovascular;  Laterality: N/A;  FaNo current facility-administered medications for this encounter.  No current outpatient medications on file.  Facility-Administered Medications Ordered in Other Encounters:  .  0.9 %  sodium chloride infusion, , Intravenous, Continuous, Moser,  Harrell Gave, MD, Last Rate: 10 mL/hr at 09/21/18 1830 .  acetaminophen (TYLENOL) tablet 650 mg, 650 mg, Oral, Q4H PRN, Georgiana Shore, NP .  amiodarone (PACERONE) tablet 200 mg, 200 mg, Oral, BID, Georgiana Shore, NP, 200 mg at 09/25/18 2106 .  atorvastatin (LIPITOR) tablet 80 mg, 80 mg, Oral, QHS, Georgiana Shore, NP, 80 mg at 09/25/18 2106 .  cefTRIAXone (ROCEPHIN) 1 g in sodium chloride 0.9 % 100 mL IVPB, 1 g, Intravenous, Q24H, Lore, Melissa A, RPH, Last Rate: 200 mL/hr at 09/25/18 1201, 1 g at 09/25/18 1201 .  docusate sodium (COLACE) capsule 200 mg, 200 mg, Oral, Daily, Georgiana Shore, NP, 200 mg at 09/23/18 1028 .  iothalamate meglumine (CYSTO-CONRAY II) 17.2 % solution 250 mL, 250 mL, Intravesical, Once PRN, Marton Redwood III, MD .  lactose free nutrition (BOOST PLUS) liquid 237 mL, 237 mL, Oral, BID BM, Bensimhon, Shaune Pascal, MD, 237 mL at 09/24/18 0941 .  levothyroxine (SYNTHROID, LEVOTHROID) tablet 150 mcg, 150 mcg, Oral, QAC breakfast, Georgiana Shore, NP, 150 mcg at 09/25/18 0830 .  multivitamin with minerals tablet 1 tablet, 1 tablet, Oral, Daily, Georgiana Shore, NP, 1 tablet at 09/25/18 0830 .  pantoprazole (PROTONIX) EC tablet 40 mg, 40 mg, Oral, BID AC, Georgiana Shore, NP, 40 mg at 09/25/18 1715 .  promethazine (PHENERGAN) tablet 12.5 mg, 12.5  mg, Oral, Q6H PRN, Georgiana Shore, NP .  sodium chloride flush (NS) 0.9 % injection 10-40 mL, 10-40 mL, Intracatheter, Q12H, Bensimhon, Shaune Pascal, MD, 10 mL at 09/25/18 2109 .  sodium chloride flush (NS) 0.9 % injection 10-40 mL, 10-40 mL, Intracatheter, PRN, Bensimhon, Shaune Pascal, MD .  sotalol (BETAPACE) tablet 80 mg, 80 mg, Oral, BID, Georgiana Shore, NP, 80 mg at 09/25/18 2106 .  temazepam (RESTORIL) capsule 30 mg, 30 mg, Oral, QHS PRN, Georgiana Shore, NP, 30 mg at 09/25/18 2106 .  Warfarin - Pharmacist Dosing Inpatient, , Does not apply, q1800, Wilburn Cornelia, RPH, 1 each at 09/25/18 1735 .  zinc sulfate capsule 220 mg, 220 mg, Oral, Daily, Bensimhon, Shaune Pascal, MD, 220 mg at 09/25/18 0830 Allergies  Allergen Reactions  . Demerol [Meperidine] Other (See Comments)    Paralysis/ Could only move eyes   Family History  Problem Relation Age of Onset  . Heart attack Mother   . Coronary artery disease Mother   . Cancer Mother   . Heart disease Mother   . Hyperlipidemia Mother   . Hypertension Mother   . Coronary artery disease Father   . Stroke Father   . Cancer Father   . Heart disease Father        before age 61  . Hyperlipidemia Father   . Hypertension Father   . Heart attack Father   . Coronary artery disease Unknown    REVIEW OF SYSTEMS:  On review of systems, the patient reports that he is doing well overall.  He remains quite active despite his LVAD.  He denies any chest pain, shortness of breath, cough, fevers, chills, night sweats, unintended weight changes. He denies any bowel or bladder disturbances, and denies abdominal pain, nausea or vomiting. He denies any new musculoskeletal or joint aches or pains. A complete review of systems is obtained and is otherwise negative.  PHYSICAL EXAM: In general this is a well appearing Caucasian male in no acute distress.  He is alert and oriented x4 and appropriate throughout the examination. HEENT reveals that  the patient is  normocephalic, atraumatic. EOMs are intact. PERRLA. Skin is intact without any evidence of gross lesions. Cardiovascular exam reveals a regular rate and rhythm, no clicks rubs or murmurs are auscultated. Chest is clear to auscultation bilaterally. Lymphatic assessment is performed and does not reveal any adenopathy in the cervical, supraclavicular, axillary, or inguinal chains. Abdomen has active bowel sounds in all quadrants and is intact. The abdomen is soft, non tender, non distended. Lower extremities are negative for pretibial pitting edema, deep calf tenderness, cyanosis or clubbing.  KPS = 80  100 - Normal; no complaints; no evidence of disease. 90   - Able to carry on normal activity; minor signs or symptoms of disease. 80   - Normal activity with effort; some signs or symptoms of disease. 12   - Cares for self; unable to carry on normal activity or to do active work. 60   - Requires occasional assistance, but is able to care for most of his personal needs. 50   - Requires considerable assistance and frequent medical care. 67   - Disabled; requires special care and assistance. 50   - Severely disabled; hospital admission is indicated although death not imminent. 94   - Very sick; hospital admission necessary; active supportive treatment necessary. 10   - Moribund; fatal processes progressing rapidly. 0     - Dead  Karnofsky DA, Abelmann [place], Craver LS and Burchenal [facility] [phone]) The use of the nitrogen mustards in the palliative treatment of carcinoma: with particular reference to bronchogenic carcinoma Cancer 1 [id number]   LABORATORY DATA:  Lab Results  Component Value Date   WBC 6.8 09/26/2018   HGB 8.5 (L) 09/26/2018   HCT 27.1 (L) 09/26/2018   MCV 96.1 09/26/2018   PLT 122 (L) 09/26/2018   Lab Results  Component Value Date   NA 138 09/26/2018   K 4.8 09/26/2018   CL 110 09/26/2018   CO2 24 09/26/2018   Lab Results  Component Value Date   ALT 33 09/17/2018   AST 36 09/17/2018    ALKPHOS 172 (H) 09/17/2018   BILITOT 0.6 09/17/2018     RADIOGRAPHY: Ct Abdomen Pelvis Wo Contrast  Result Date: 09/22/2018 CLINICAL DATA:  77 year old male or bladder cancer staging. EXAM: CT ABDOMEN AND PELVIS WITHOUT CONTRAST TECHNIQUE: Multidetector CT imaging of the abdomen and pelvis was performed following the standard protocol without IV contrast, which could not be administered due to renal insufficiency. COMPARISON:  12/14/2017 and prior CTs FINDINGS: Please note that parenchymal abnormalities may be missed without intravenous contrast. Lower chest: Cardiomegaly, cardiac surgical changes, pacemaker lead and LEFT ventricular assist device again identified. Emphysema again noted. Hepatobiliary: The liver is unremarkable. Patient is status post cholecystectomy. No biliary dilatation. Pancreas: Unremarkable Spleen: Unremarkable Adrenals/Urinary Tract: New moderate to severe RIGHT hydroureteronephrosis to the bladder is identified. Mild to moderate LEFT hydroureteronephrosis to the bladder is unchanged. Foley catheter within the bladder is again identified. Low-density renal lesions/cysts are unchanged. The adrenal glands are unremarkable. Stomach/Bowel: Stomach is within normal limits. No evidence of bowel wall thickening, distention, or inflammatory changes. Vascular/Lymphatic: Aortoiliac repair/grafts noted. Aortic atherosclerotic calcifications noted. No enlarged lymph nodes. Reproductive: No definite prostate abnormality. Other: Presacral/precoccygeal stranding/edema again noted. No ascites, focal collection or pneumoperitoneum. Musculoskeletal: No acute or suspicious bony abnormalities. L4 compression fracture and LOWER lumbar fusion hardware again noted. IMPRESSION: 1. New moderate to severe RIGHT hydroureteronephrosis to the bladder without obstructing cause identified on this study. 2. Unchanged mild to moderate LEFT  hydroureteronephrosis to the bladder. Foley catheter within the bladder  again identified. 3. No suspicious bony abnormalities or evidence of metastatic disease on this study. 4. Cardiomegaly and LEFT ventricular assist device 5. Aortic Atherosclerosis (ICD10-I70.0) and Emphysema (ICD10-J43.9). Electronically Signed   By: Margarette Canada M.D.   On: 09/22/2018 13:00   Dg Retrograde Pyelogram  Result Date: 09/19/2018 CLINICAL DATA:  Left retrograde pyelogram EXAM: RETROGRADE PYELOGRAM COMPARISON:  05/16/2018 FINDINGS: Spot fluoroscopic imaging performed during the operative procedure. Retrograde left ureteral injection performed. Left ureter and collecting system appear patent. No obstruction, significant strictures, or filling defect. No hydronephrosis. Bifid left renal pelvis present. IMPRESSION: Patent left ureter without obstruction or hydronephrosis. Electronically Signed   By: Jerilynn Mages.  Shick M.D.   On: 09/19/2018 15:08   US Renal  Result Date: 09/20/2018 CLINICAL DATA:  Recent bladder tumor debulking, evaluate for possible hydronephrosis EXAM: RENAL / URINARY TRACT ULTRASOUND COMPLETE COMPARISON:  09/18/2018 FINDINGS: Right Kidney: Renal measurements: 10.9 x 7.0 x 8.0 cm = volume: 319 mL. Moderate hydronephrosis is noted. Large renal cyst is noted measuring 7.1 cm. Left Kidney: Renal measurements: 8.1 x 4.5 x 6.7 = volume: 128 mL. Mild left hydronephrosis is noted less than that seen on the right. Two cysts are noted on the left. The largest of these measures 3.4 cm. Bladder: Decompressed by Foley catheter. IMPRESSION: Bilateral hydronephrosis right greater than left the right-sided hydronephrosis is slightly improved from the prior exam. The left hydronephrosis is increased from the prior study. Stable renal cysts. Electronically Signed   By: Inez Catalina M.D.   On: 09/20/2018 13:24   US Renal  Result Date: 09/18/2018 CLINICAL DATA:  77 year old male with acute kidney injury. History of bladder cancer with TURBT. EXAM: RENAL / URINARY TRACT ULTRASOUND COMPLETE COMPARISON:   05/19/2018 ultrasound and 12/14/2017 CT FINDINGS: Right Kidney: Renal measurements: 10.3 x 5.3 x 5.7 cm = volume: 169 mL. Moderate to severe RIGHT hydronephrosis now identified. RIGHT renal cysts are again noted, the largest measuring 7.4 cm. Echogenicity within normal limits. Left Kidney: Renal measurements: 11.8 x 6.5 x 5.9 cm = volume: 235 mL. Echogenicity within normal limits. LEFT renal cysts are again noted. No hydronephrosis identified. Bladder: New bladder wall nodules/masses are noted, some which contain internal vascular flow and compatible with malignancy. The largest nodule measures 2.4 cm anteriorly. Prostate enlargement is present. IMPRESSION: 1. Bladder wall masses/nodules with vascular flow now noted compatible with malignancy. The largest bladder wall mass measures 2.4 cm anteriorly. 2. New moderate to severe RIGHT hydronephrosis. Electronically Signed   By: Margarette Canada M.D.   On: 09/18/2018 13:07   Dg Chest Port 1 View  Result Date: 09/17/2018 CLINICAL DATA:  Assess PICC line placement EXAM: PORTABLE CHEST 1 VIEW COMPARISON:  May 18, 2018 FINDINGS: No pneumothorax. A new right PICC line terminates in the central SVC. The LVAD is stable. The AICD device is stable. Mild pulmonary venous congestion, improved. No other interval changes or acute abnormalities. IMPRESSION: The new right PICC line is in good position. No other significant change. Electronically Signed   By: Dorise Bullion III M.D   On: 09/17/2018 22:15   [place] Ekg Site Rite  Result Date: 09/17/2018 If Site Rite image not attached, placement could not be confirmed due to current cardiac rhythm.     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 Today, we talked to the patient and his wife about the findings and work-up thus far.  We discussed the natural history of  muscle invasive bladder cancer and general treatment, highlighting the role of radiotherapy in the management of his disease.  We discussed the  available radiation techniques, and focused on the details of logistics and delivery.  The recommendation is to proceed with a an additional 4-week course of daily radiotherapy to the bladder, +/- radiosensitizing chemotherapy.  His case will be discussed at the upcoming multidisciplinary urology oncology conference on 10/17/2018 for further discussion regarding recommendations to proceed with radiation alone versus concurrent chemoradiation.  We reviewed the anticipated acute and late sequelae associated with radiation in this setting.  The patient was encouraged to ask questions that were answered to his stated satisfaction.  He appears to have a good understanding of his overall disease and our treatment recommendations and would like to proceed with the proposed additional course of radiation.  Based on recommendations at upcoming urology oncology conference, we will proceed with treatment planning accordingly.   He has freely signed written consent to proceed and we will proceed with CT simulation/radiation treatment planning this morning in anticipation of beginning radiation treatments as soon as 10/27/2018.  If he is deemed to be a good candidate for chemoradiation, we will connect him with Dr. Alen Blew.  He will require LVAD monitoring during his treatments.    Nicholos Johns, PA-C    [name] Pita, MD  [facility] Direct Dial: [phone]  Fax: [phone] [email or web address]  Skype  LinkedIn

## 2018-10-16 NOTE — Progress Notes (Signed)
  Radiation Oncology         [phone] ________________________________  Name: [name] MRN: [id number]  Date: 10/16/2018  DOB: 21-Mar-1941  SIMULATION AND TREATMENT PLANNING NOTE    ICD-10-CM   1. 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  Identity was confirmed.  All relevant records and images related to the planned course of therapy were reviewed.  The patient freely provided informed written consent to proceed with treatment after reviewing the details related to the planned course of therapy. The consent form was witnessed and verified by the simulation staff.  Then, the patient was set-up in a stable reproducible  supine position for radiation therapy.  Contrast was instilled into the bladder with a catheter under sterile conditions.  CT images were obtained.  Surface markings were placed.  The CT images were loaded into the planning software.  Then the target and avoidance structures were contoured.  Treatment planning then occurred.  The radiation prescription was entered and confirmed.  Then, I designed and supervised the construction of a total of 5 medically necessary complex treatment devices including VacLoc body positioner and 4 MLCs to shield the bowel and femoral necks.  I have requested : 3D Simulation  I have requested a DVH of the following structures: small bowel, rectum, left femoral head, right femoral head and targets.  SPECIAL TREATMENT PROCEDURE:  The planned course of therapy using radiation constitutes a special treatment procedure. Special care is required in the management of this patient for the following reasons. This treatment constitutes a Special Treatment Procedure for the following reason: [ Concurrent chemotherapy requiring careful monitoring for increased toxicities of treatment  including weekly laboratory values..  The special nature of the planned course of radiotherapy will require increased physician supervision and oversight to ensure patient's safety with optimal treatment outcomes.  PLAN:  The patient will receive 44 Gy in 22 fractions to the bladder and pelvic nodes, with simultaneous integrated boost to the bladder tumor to a total dose of 55 Gy producing a total nominal dose to the bladder tumor of 70 Gy split-course from previous 15 Gy for hemostasis.  ________________________________  Sheral Apley Tammi Klippel, M.D.

## 2018-10-17 ENCOUNTER — Other Ambulatory Visit (HOSPITAL_COMMUNITY): Payer: Self-pay | Admitting: *Deleted

## 2018-10-17 DIAGNOSIS — Z95811 Presence of heart assist device: Secondary | ICD-10-CM

## 2018-10-17 DIAGNOSIS — Z7901 Long term (current) use of anticoagulants: Secondary | ICD-10-CM

## 2018-10-20 ENCOUNTER — Ambulatory Visit: Payer: Medicare Other

## 2018-10-20 ENCOUNTER — Ambulatory Visit (HOSPITAL_BASED_OUTPATIENT_CLINIC_OR_DEPARTMENT_OTHER)
Admission: RE | Admit: 2018-10-20 | Discharge: 2018-10-20 | Disposition: A | Discharge status: Home / Self Care | Payer: Medicare Other | Source: Ambulatory Visit | Attending: Internal Medicine | Admitting: Internal Medicine

## 2018-10-20 VITALS — BP 102/0 | HR 75 | Ht 72.0 in | Wt 177.4 lb

## 2018-10-20 DIAGNOSIS — N029 Recurrent and persistent hematuria with unspecified morphologic changes: Secondary | ICD-10-CM | POA: Insufficient documentation

## 2018-10-20 DIAGNOSIS — Z8673 Personal history of transient ischemic attack (TIA), and cerebral infarction without residual deficits: Secondary | ICD-10-CM | POA: Insufficient documentation

## 2018-10-20 DIAGNOSIS — Z85820 Personal history of malignant melanoma of skin: Secondary | ICD-10-CM

## 2018-10-20 DIAGNOSIS — D759 Disease of blood and blood-forming organs, unspecified: Secondary | ICD-10-CM

## 2018-10-20 DIAGNOSIS — Z7901 Long term (current) use of anticoagulants: Secondary | ICD-10-CM | POA: Insufficient documentation

## 2018-10-20 DIAGNOSIS — I2510 Atherosclerotic heart disease of native coronary artery without angina pectoris: Secondary | ICD-10-CM | POA: Insufficient documentation

## 2018-10-20 DIAGNOSIS — I739 Peripheral vascular disease, unspecified: Secondary | ICD-10-CM

## 2018-10-20 DIAGNOSIS — I480 Paroxysmal atrial fibrillation: Secondary | ICD-10-CM

## 2018-10-20 DIAGNOSIS — M4856XS Collapsed vertebra, not elsewhere classified, lumbar region, sequela of fracture: Secondary | ICD-10-CM

## 2018-10-20 DIAGNOSIS — N1330 Unspecified hydronephrosis: Secondary | ICD-10-CM

## 2018-10-20 DIAGNOSIS — I472 Ventricular tachycardia: Secondary | ICD-10-CM | POA: Insufficient documentation

## 2018-10-20 DIAGNOSIS — D494 Neoplasm of unspecified behavior of bladder: Secondary | ICD-10-CM | POA: Insufficient documentation

## 2018-10-20 DIAGNOSIS — Z95811 Presence of heart assist device: Secondary | ICD-10-CM

## 2018-10-20 DIAGNOSIS — G4730 Sleep apnea, unspecified: Secondary | ICD-10-CM | POA: Insufficient documentation

## 2018-10-20 DIAGNOSIS — Z510 Encounter for antineoplastic radiation therapy: Secondary | ICD-10-CM | POA: Diagnosis not present

## 2018-10-20 DIAGNOSIS — I252 Old myocardial infarction: Secondary | ICD-10-CM

## 2018-10-20 DIAGNOSIS — E785 Hyperlipidemia, unspecified: Secondary | ICD-10-CM | POA: Insufficient documentation

## 2018-10-20 DIAGNOSIS — M1990 Unspecified osteoarthritis, unspecified site: Secondary | ICD-10-CM | POA: Insufficient documentation

## 2018-10-20 DIAGNOSIS — I471 Supraventricular tachycardia: Secondary | ICD-10-CM | POA: Insufficient documentation

## 2018-10-20 DIAGNOSIS — I255 Ischemic cardiomyopathy: Secondary | ICD-10-CM | POA: Insufficient documentation

## 2018-10-20 DIAGNOSIS — I5022 Chronic systolic (congestive) heart failure: Secondary | ICD-10-CM | POA: Insufficient documentation

## 2018-10-20 DIAGNOSIS — C679 Malignant neoplasm of bladder, unspecified: Secondary | ICD-10-CM | POA: Diagnosis not present

## 2018-10-20 DIAGNOSIS — I130 Hypertensive heart and chronic kidney disease with heart failure and stage 1 through stage 4 chronic kidney disease, or unspecified chronic kidney disease: Secondary | ICD-10-CM | POA: Insufficient documentation

## 2018-10-20 DIAGNOSIS — E039 Hypothyroidism, unspecified: Secondary | ICD-10-CM | POA: Insufficient documentation

## 2018-10-20 DIAGNOSIS — Z79899 Other long term (current) drug therapy: Secondary | ICD-10-CM

## 2018-10-20 DIAGNOSIS — Z79890 Hormone replacement therapy (postmenopausal): Secondary | ICD-10-CM | POA: Insufficient documentation

## 2018-10-20 DIAGNOSIS — Z955 Presence of coronary angioplasty implant and graft: Secondary | ICD-10-CM

## 2018-10-20 DIAGNOSIS — Z95810 Presence of automatic (implantable) cardiac defibrillator: Secondary | ICD-10-CM

## 2018-10-20 DIAGNOSIS — N183 Chronic kidney disease, stage 3 unspecified: Secondary | ICD-10-CM

## 2018-10-20 LAB — CBC
HCT: 31.6 % — ABNORMAL LOW (ref 39.0–52.0)
HEMOGLOBIN: 9.5 g/dL — AB (ref 13.0–17.0)
MCH: 30.5 pg (ref 26.0–34.0)
MCHC: 30.1 g/dL (ref 30.0–36.0)
MCV: 101.6 fL — ABNORMAL HIGH (ref 80.0–100.0)
Platelets: 164 10*3/uL (ref 150–400)
RBC: 3.11 MIL/uL — ABNORMAL LOW (ref 4.22–5.81)
RDW: 18.6 % — ABNORMAL HIGH (ref 11.5–15.5)
WBC: 5.5 10*3/uL (ref 4.0–10.5)
nRBC: 0 % (ref 0.0–0.2)

## 2018-10-20 LAB — PREALBUMIN: Prealbumin: 23.6 mg/dL (ref 18–38)

## 2018-10-20 LAB — COMPREHENSIVE METABOLIC PANEL
ALT: 33 U/L (ref 0–44)
AST: 38 U/L (ref 15–41)
Albumin: 3.5 g/dL (ref 3.5–5.0)
Alkaline Phosphatase: 153 U/L — ABNORMAL HIGH (ref 38–126)
Anion gap: 9 (ref 5–15)
BUN: 42 mg/dL — ABNORMAL HIGH (ref 8–23)
CO2: 24 mmol/L (ref 22–32)
Calcium: 8.6 mg/dL — ABNORMAL LOW (ref 8.9–10.3)
Chloride: 100 mmol/L (ref 98–111)
Creatinine, Ser: 1.95 mg/dL — ABNORMAL HIGH (ref 0.61–1.24)
GFR calc Af Amer: 37 mL/min — ABNORMAL LOW (ref >60)
GFR calc non Af Amer: 32 mL/min — ABNORMAL LOW (ref >60)
GLUCOSE: 101 mg/dL — AB (ref 70–99)
Potassium: 4.7 mmol/L (ref 3.5–5.1)
Sodium: 133 mmol/L — ABNORMAL LOW (ref 135–145)
Total Bilirubin: 0.4 mg/dL (ref 0.3–1.2)
Total Protein: 7.1 g/dL (ref 6.5–8.1)

## 2018-10-20 LAB — PROTIME-INR
INR: 2.47
Prothrombin Time: 26.4 seconds — ABNORMAL HIGH (ref 11.4–15.2)

## 2018-10-20 LAB — LACTATE DEHYDROGENASE: LDH: 229 U/L — ABNORMAL HIGH (ref 98–192)

## 2018-10-20 LAB — MAGNESIUM: Magnesium: 2.2 mg/dL (ref 1.7–2.4)

## 2018-10-20 NOTE — Progress Notes (Signed)
Patient ID: [name], male   DOB: [birth year], 77 y.o.   MRN: [id number]   VAD CLINIC PROGRESS NOTE  PCP: Dr. Kandice Robinsons ([place]) GU: Dr Gloriann Loan Neuro Surgeon: Dr Vertell Limber Vascular: Dr Kellie Simmering Primary HF: Dr Darlyn Read [name] is a 77 y.o. male  with h/o VT, PAD and severe systolic HF (EF 25-42%) due to mixed ischemic/nonischemic CM he is s/p HM II LVAD implant for destination therapy on October 19, 2013.VAD implantation was complicated by VT, syncope, AF/Aflutter and RHF. Required short term milrinone.   GU Event 09/01/14 had impacted ureteral stone and underwent cystoscopy and flexible ureteroscopy with lithotripsy and basket extraction.  09/2015 Hematuria and chronic nephrolithiasis. Renal US showed mild hyronephrosis and bilateral renal cysts. Dr. Risa Grill took back for repeat lithotripsy and stone extraction by ureteroscopy and J stent placement. 12/18 Recurrent hematuria. 97mm left ureteral stone. Passed with tamulosin 1/19   Admitted with recurrent hematuria with ureteral stone. Stone removed but found to have large bladder mass which was removed. Course c/b obstructive uropathy and AKI requiring 1 session of HD.  3/19 Bladder chemo stopped after 2 treatments as unable to tolerate 05/16/18 s/p TURB with right ureteral stent placement.  11/19 Recurrent bladder tumor excision with persistent hematuria requiring XRT  GI Event  02/2015 - Hgb 5.4 --> EGD that showed 2 AVMs in the jejunum that were clipped.  4/2-18 - Episode of melena. Hgb 10.9-> 8.7. EGD/colonoscopy negative 12/18 - Melena with syncope  Hgb 6.5. Transfused 5u. 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  01/2016 R CEA. He was started on Plavix.  01/2016 Lumbar fusion L3-L5 with pedicle screws posterolateral arthrodesis with BMP, allograft  VT events 9/17 with high fevers and productive cough. Treated with levaquin x 7 days for acute bronchitis. Also found to have VT and underwent  DC-CV. Seen by EP and amio increased 10/17 speed turned down to 8800 due to RV failure. 10/18 with slow VT and ADHF.Cardioverted in ED and amio uptitrated.  04/01/18 recurrent VT. Underwent DC-CV in Er 07/09/18 presented to ED. Urgently cardioverted. EP started on Sotolol.   Significant VAD Events  03/2017>percutaneous lead repair  Hospitalized 09/17/18-10/01/18 for heparin bridge for recurrent bladder tumor resection on 09/19/18. Renal US showed recurrent right hydronephrosis without BOO and evidence of recurrent bladder tumor. Underwent recurrent bladder tumor resection 09/19/18 with Dr Gloriann Loan.Found to be large fast-growing tumor with obstruction of right ureteral orifice (unable to uncover right ureteral orifice). Repeat renal US showed slightly improved right hydronephrosis and new left mild hydronephrosis. Urology offered nephrostomy tube, but he declined. CT  abdomen completed and showed bilateral hydronephrosis L>R and was negative for locally invasive disease. 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   He had urinary retention so Foley cath placed. This was later removed but he had catheter placed again. He had large clots so he started continuous irrigation. Dr Gloriann Loan and Dr Tammi Klippel recommended a few days on radiation due to ongoing hematuria. He under went radiation on 11/25, 11/26, and 11/27. Hemoglobin dropped down to 7.8 and Hematuria resolved so foley was removed and he was able to void. He will follow up with Dr Tammi Klippel in January.   Ramp echo done 11/14 and speed turned down to 8600. He had intermittent low flows and he was given IV fluids. He has been instructed to continue to drink gatorade because this has been an ongoing problem. INR  was followed closely. INR therapeutic on the day of discharge. PICC line was remove on the day of discharge.   Follow up LVAD Clinic: Here for post d/c follow-up. Feels weak but able to do all  ADLs. Denies any further bleeding. Having multiple low flow alarms on VAD. Po intake poor. No fevers or chills. Taking all meds as prescribed. No problem with Driveline    Medtronic: Did not result   VAD Indication: Destination Therapy- age excluding    VAD interrogation & Equipment Management: Speed: 8600 Flow:  3.0 Power: 3.9w PI: 5.9  Alarms: 29 LF today, 12/15-42; 12/14-20; 12/13-5 Events: 5-20 daily PI events  Fixed speed 8600 Low speed limit: 8000  Primary Controller: Replace back up battery in 23 months Back up controller: Replace back up battery in 29months    Exit Site Care: Drive line is maintained weekly by [name]. Drive line exit site well healed and incorporated. The velour is fully implanted at exit site. Dressing dry and intact. No erythema or drainage. Stabilization device present and accurately applied. Pt denies fever or chills.   I reviewed the LVAD parameters from today, and compared the results to the patient's prior recorded data.  No programming changes were made.  The LVAD is functioning within specified parameters.  The patient performs LVAD self-test daily.  LVAD interrogation was negative for any significant power changes, alarms or PI events/speed drops.  LVAD equipment check completed and is in good working order.  Back-up equipment present.   LVAD education done on emergency procedures and precautions and reviewed exit site care.   Past Medical History:  Diagnosis Date  . Arthritis   . Automatic implantable cardioverter-defibrillator in situ    a. s/p Medtronic Evera device serial [id number]    . Bradycardia   . CAD (coronary artery disease)    a. s/p MI 1982;  s/p DES to CFX 2011;  s/p NSTEMI 4/12 in setting of VTach;  cath 7/12:  LM ok, LAD mid 30-40%, Dx's with 40%, mCFX stent patent, prox to mid RCA occluded with R to R and L to R collats.  Medical Tx was continued  . Chronic systolic heart failure ([place])    a. s/p LVAD 10/2013 for  destination therapy  . CKD (chronic kidney disease)   . CVD (cerebrovascular disease)    a. s/p L CEA 2008  . Cytopenia   . Dysrhythmia    AFIB  . History of GI bleed    a. on ASA- started on plavix during 10/2015 admission for CVA  . HLD (hyperlipidemia)   . HTN (hypertension)   . Hypothyroidism   . Inappropriate therapy from implantable cardioverter-defibrillator    a. ATP for sinus tach SVT wavelet identified SVT but was no  passive (2014)  . Ischemic cardiomyopathy   . 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 L Ear   . Myocardial infarction ([place])    1992  . PAF (paroxysmal atrial fibrillation) ([place])   . PVD (peripheral vascular disease) ([place])    a. 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 Shortness of breath dyspnea    W/ EXERTION   . Sleep apnea    NO CPAP NOW  HE SENT IT BACK YRS AGO  . Stroke [facility])    a. 10/2015 admission   . Ventricular tachycardia ([place])  a. s/p AICD;   h/o ICD shock;  Amiodarone Rx. S/P ablation in 2012    Current Outpatient Medications  Medication Sig Dispense Refill  . amiodarone (PACERONE) 200 MG tablet Take 1 tablet (200 mg total) by mouth 2 (two) times daily. 120 tablet 5  . atorvastatin (LIPITOR) 80 MG tablet Take 1 tablet (80 mg total) by mouth at bedtime. 90 tablet 1  . docusate sodium (COLACE) 100 MG capsule Take 100 mg by mouth daily.     [name] levothyroxine (SYNTHROID, LEVOTHROID) 150 MCG tablet Take 1 tablet (150 mcg total) by mouth daily before breakfast. 90 tablet 3  . Multiple Vitamin (MULTIVITAMIN WITH MINERALS) TABS tablet Take 1 tablet by mouth daily.    . Multiple Vitamins-Minerals (ICAPS PO) Take 1 tablet by mouth 2 (two) times daily.    . pantoprazole (PROTONIX) 40 MG tablet TAKE 1 TABLET TWICE DAILY (Patient taking differently: Take 40 mg by mouth 2 (two) times daily before a meal. ) 180 tablet 6  . protein supplement shake (PREMIER PROTEIN) LIQD Take  11 oz by mouth 2 (two) times daily.    . sotalol (BETAPACE) 80 MG tablet Take 1 tablet (80 mg total) by mouth 2 (two) times daily. 60 tablet 6  . temazepam (RESTORIL) 30 MG capsule Take 1 capsule (30 mg total) by mouth at bedtime as needed for sleep. 30 capsule 3  . warfarin (COUMADIN) 2 MG tablet Take 2 mg by mouth every evening. Except 1 mg (1/2 tablet) on Tuesday    . zinc gluconate 50 MG tablet Take 50 mg by mouth 2 (two) times daily.     . furosemide (LASIX) 20 MG tablet Take 20 mg by mouth daily as needed for fluid.    . promethazine (PHENERGAN) 12.5 MG tablet Take 12.5 mg by mouth every 6 (six) hours as needed for nausea or vomiting.     No current facility-administered medications for this encounter.     Demerol [meperidine]  Review of systems complete and found to be negative unless listed in HPI.    Vitals:   10/20/18 1503 10/20/18 1504  BP: 99/83 (!) 102/0  Pulse: 75   SpO2: 98%   Weight: 80.5 kg (177 lb 6.4 oz)   Height: 6' (1.829 m)      Vital Signs:  Doppler Pressure: 75 Automatc BP:  99/83 (88) HR: 75 SPO2: 98%  Weight: 177.4  lb w/o eqt Last weight: 175.9 lb   Physical exam: General:  Pale. Weak appearing.  HEENT: normal  Neck: supple. JVP not elevated.  Carotids 2+ bilat; no bruits. No lymphadenopathy or thryomegaly appreciated. Cor: LVAD hum.  Lungs: Clear. Abdomen: soft, nontender, non-distended. No hepatosplenomegaly. No bruits or masses. Good bowel sounds. Driveline site clean. Anchor in place.  Extremities: no cyanosis, clubbing, rash. Warm no edema  Neuro: alert & oriented x 3. No focal deficits. Moves all 4 without problem    ASSESSMENT AND PLAN:  1. Chronic systolic HF: s/p HM-II LVAD 10/2014. s/p previous driveline [id number]. No further pump stops. Sunburst in 1/19 with mildly low output. Started on milrinone without benefitso it was stopped.  - He is weak. NYHA III in setting of treatment for recurrent bladder tumor - Now with multiple  low flow alarms on VAD. He is dry on exam  - Continue to encourage po intake.  - VAD speed turned down to 8400 2. Recurrent high-grade bladder tumor:  - S/p stone removal.  Found to have high-grade urothelial tumor 1/19 s/p  resection.  Had 2 BCG infusions with Dr. Gloriann Loan but discontinued due to inability to tolerate. - S/p repeat resection 7/19.  - s/p repeat bladder tumor excision in 11/19 with fast-growing tumor. C/b bilateral hydronephrosis and persistent hematuria requiring XRT. - now planning palliative XRT with Dr. Tammi Klippel. No further hematuria 3. CKD, stage III:  Had AKI in setting of obstructive uropathy and ATN. Required HD x1(11/2017).   - Cr up to 1.95 today. (previous 1.5-1.8). Increase po intake.  -Not interested in nephrostomy tubes for hydronephrosis 4. LVAD:  - VAD interrogated personally. Multiple low flow alarms. In NSR today. Hgb stable at 9.5  - VAD speed turned down to 8400 - INR 2.47 Discussed dosing with PharmD personally. - LDH stable 229 5.RecurrentVTwith hypotension and low flow alarms on VAD: Underwent DC-CV 9/17, 08/17/17, and 04/01/18.  S/p emergent DC-CV in ER 07/09/18  -Remains in NSR today. Continue amio. May need to stop sotalol soon or decrease dose if creatinine worsens.  - Keep K > 4.0 Mg > 2.0 6. GIB:  s/p clipping of 2 jejunal AVMs in 4/16. Continue coumadin and plavix.(with h/o CVA). Admitted 4/18 with recurrent GIB. Transfused 2u. EGD/colon negative. Admitted 12./18. hgb 6.5, Transfused 5u. EGD with 2 AVMs treated with APC. No overt bleeding.  - Hgb 9.5 today. No evidence of active bleeding  - Hadbeen getting octreotide infusions but too expensive for him. Willcontinue to hold for now 7. Atrial arrhythmias: Atrial fibrillation, atrial tachycardia. s/p multiple DC-CVs last 07/02/18.  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  - Continue amiodarone and stoalol  8. HTN:  - MAPs stable.  9. Anticoagulation management:  - Goal INR 2-2.5. INR 2.47. Dosing per  Pharm-D as above 10. Lumbar compression fracture: S/p L3-L5 fusion 02/03/16. - Stable.  11. 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 S/p R CEA 01/13/16. Plavix stopped 12/18 due to recurrent GIB. - Continue atorvastatin (andwarfarin INR goal 2-2.5).  - No change to current plan.   12. H/o amio-induced hypethyroidism:  Followed by Dr. Forde Dandy in past.  - Amio restarted due to recurrent atrial arrhythmias and VT.  - Follow.   Total time spent 45 minutes. Over half that time spent discussing above.   Glori Bickers, MD  9:02 PM

## 2018-10-20 NOTE — Progress Notes (Signed)
Patient presents for hospital follow up in [facility] today with caregiver, [name]. Reports no problems with VAD equipment or concerns with drive line exit site.   Pt states that he has been having a lot of low flow alarms. Pt states that he is feeling weaker and weaker.   Vital Signs:  Doppler Pressure: 75 Automatc BP:  99/83 (88) HR: 75 SPO2: 98%  Weight: 177.4  lb w/o eqt Last weight: 175.9 lb  VAD Indication: Destination Therapy- age excluding    VAD interrogation & Equipment Management: Speed: 8600 Flow:  3.0 Power: 3.9w    PI: 5.9  Alarms: 29 LF today, 12/15-42; 12/14-20; 12/13-5 Events: 5-20 daily PI events  Fixed speed 8600 Low speed limit: 8000  Primary Controller: Replace back up battery in 23 months Back up controller: Replace back up battery in 48months  Exit Site Care: Drive line is being maintained weekly by [name]. Drive line exit site well healed and incorporated. The velour is fully implanted at exit site. Dressing dry and intact. No erythema or drainage. Stabilization device present and accurately applied. Pt denies fever or chills. Pt has adequate dressing kits at home.   Significant Events on VAD Support:  02/2015 >>GIB (removed ASA, 2-2.5 INR) 10/2015 >>CVA(Plavix started) 02/2017> GIB (removed ASA, scopes negative) 03/2017> percutaneous lead repair 12/2017> Bladder CA  Device:Medtronic Therapies: on 200 bpm AT monitor: 140 bpm Last check: today  5 year Intermacs follow up completed including:  Quality of Life, KCCQ-12, and Neurocognitive trail making. (2 min; 25 secs)  Pt unable to complete a 6 minute walk due to weakness.  Back up controller:  11V backup battery charged during this visit.    BP & Labs:  MAP 88 - Doppler is reflecting Modified systolic  Hgb 9.5- pt states he that his stool was black yesterday x 1.  LDH 229 - with established baseline of 280-380. Denies tea-colored urine. No power elevations noted on  interrogation.    Patient Instructions: 1. Drink as much fluid as possible. 2. Dropped speed to 8400 today. 3. Return to clinic in 2 weeks.   Tanda Rockers RN [place] Coordinator  Office: [phone]  24/7 Pager: [phone]

## 2018-10-21 ENCOUNTER — Ambulatory Visit (HOSPITAL_COMMUNITY): Payer: Self-pay | Admitting: Pharmacist

## 2018-10-21 ENCOUNTER — Ambulatory Visit: Payer: Medicare Other

## 2018-10-22 ENCOUNTER — Ambulatory Visit: Payer: Medicare Other

## 2018-10-22 ENCOUNTER — Inpatient Hospital Stay (HOSPITAL_COMMUNITY)
Admission: AD | Admit: 2018-10-22 | Discharge: 2018-10-28 | DRG: 264 | Disposition: A | Discharge status: Home / Self Care | Payer: Medicare Other | Attending: Internal Medicine | Admitting: Internal Medicine

## 2018-10-22 DIAGNOSIS — I50810 Right heart failure, unspecified: Secondary | ICD-10-CM

## 2018-10-22 DIAGNOSIS — Z823 Family history of stroke: Secondary | ICD-10-CM

## 2018-10-22 DIAGNOSIS — I255 Ischemic cardiomyopathy: Secondary | ICD-10-CM | POA: Diagnosis present

## 2018-10-22 DIAGNOSIS — I2510 Atherosclerotic heart disease of native coronary artery without angina pectoris: Secondary | ICD-10-CM | POA: Diagnosis present

## 2018-10-22 DIAGNOSIS — I480 Paroxysmal atrial fibrillation: Secondary | ICD-10-CM | POA: Diagnosis present

## 2018-10-22 DIAGNOSIS — Z87891 Personal history of nicotine dependence: Secondary | ICD-10-CM

## 2018-10-22 DIAGNOSIS — K921 Melena: Secondary | ICD-10-CM | POA: Diagnosis present

## 2018-10-22 DIAGNOSIS — C679 Malignant neoplasm of bladder, unspecified: Secondary | ICD-10-CM | POA: Diagnosis present

## 2018-10-22 DIAGNOSIS — I252 Old myocardial infarction: Secondary | ICD-10-CM

## 2018-10-22 DIAGNOSIS — N131 Hydronephrosis with ureteral stricture, not elsewhere classified: Secondary | ICD-10-CM | POA: Diagnosis not present

## 2018-10-22 DIAGNOSIS — K5790 Diverticulosis of intestine, part unspecified, without perforation or abscess without bleeding: Secondary | ICD-10-CM | POA: Diagnosis present

## 2018-10-22 DIAGNOSIS — I4892 Unspecified atrial flutter: Secondary | ICD-10-CM | POA: Diagnosis present

## 2018-10-22 DIAGNOSIS — Z9889 Other specified postprocedural states: Secondary | ICD-10-CM | POA: Diagnosis not present

## 2018-10-22 DIAGNOSIS — I428 Other cardiomyopathies: Secondary | ICD-10-CM | POA: Diagnosis present

## 2018-10-22 DIAGNOSIS — M4856XA Collapsed vertebra, not elsewhere classified, lumbar region, initial encounter for fracture: Secondary | ICD-10-CM | POA: Diagnosis not present

## 2018-10-22 DIAGNOSIS — Z95811 Presence of heart assist device: Secondary | ICD-10-CM

## 2018-10-22 DIAGNOSIS — N132 Hydronephrosis with renal and ureteral calculous obstruction: Secondary | ICD-10-CM | POA: Diagnosis not present

## 2018-10-22 DIAGNOSIS — N183 Chronic kidney disease, stage 3 unspecified: Secondary | ICD-10-CM | POA: Diagnosis present

## 2018-10-22 DIAGNOSIS — T829XXA Unspecified complication of cardiac and vascular prosthetic device, implant and graft, initial encounter: Secondary | ICD-10-CM | POA: Diagnosis not present

## 2018-10-22 DIAGNOSIS — Z8551 Personal history of malignant neoplasm of bladder: Secondary | ICD-10-CM

## 2018-10-22 DIAGNOSIS — E785 Hyperlipidemia, unspecified: Secondary | ICD-10-CM | POA: Diagnosis present

## 2018-10-22 DIAGNOSIS — D649 Anemia, unspecified: Secondary | ICD-10-CM | POA: Diagnosis present

## 2018-10-22 DIAGNOSIS — Z8349 Family history of other endocrine, nutritional and metabolic diseases: Secondary | ICD-10-CM

## 2018-10-22 DIAGNOSIS — I472 Ventricular tachycardia: Secondary | ICD-10-CM | POA: Diagnosis present

## 2018-10-22 DIAGNOSIS — N029 Recurrent and persistent hematuria with unspecified morphologic changes: Secondary | ICD-10-CM | POA: Diagnosis present

## 2018-10-22 DIAGNOSIS — E039 Hypothyroidism, unspecified: Secondary | ICD-10-CM | POA: Diagnosis present

## 2018-10-22 DIAGNOSIS — Z85820 Personal history of malignant melanoma of skin: Secondary | ICD-10-CM

## 2018-10-22 DIAGNOSIS — Z955 Presence of coronary angioplasty implant and graft: Secondary | ICD-10-CM

## 2018-10-22 DIAGNOSIS — I5022 Chronic systolic (congestive) heart failure: Secondary | ICD-10-CM | POA: Diagnosis not present

## 2018-10-22 DIAGNOSIS — K5520 Angiodysplasia of colon without hemorrhage: Secondary | ICD-10-CM | POA: Diagnosis not present

## 2018-10-22 DIAGNOSIS — I130 Hypertensive heart and chronic kidney disease with heart failure and stage 1 through stage 4 chronic kidney disease, or unspecified chronic kidney disease: Secondary | ICD-10-CM | POA: Diagnosis not present

## 2018-10-22 DIAGNOSIS — I440 Atrioventricular block, first degree: Secondary | ICD-10-CM | POA: Diagnosis present

## 2018-10-22 DIAGNOSIS — Z809 Family history of malignant neoplasm, unspecified: Secondary | ICD-10-CM

## 2018-10-22 DIAGNOSIS — N170 Acute kidney failure with tubular necrosis: Secondary | ICD-10-CM | POA: Diagnosis not present

## 2018-10-22 DIAGNOSIS — K31819 Angiodysplasia of stomach and duodenum without bleeding: Secondary | ICD-10-CM | POA: Diagnosis not present

## 2018-10-22 DIAGNOSIS — Z9842 Cataract extraction status, left eye: Secondary | ICD-10-CM

## 2018-10-22 DIAGNOSIS — R195 Other fecal abnormalities: Secondary | ICD-10-CM | POA: Diagnosis not present

## 2018-10-22 DIAGNOSIS — Z95810 Presence of automatic (implantable) cardiac defibrillator: Secondary | ICD-10-CM

## 2018-10-22 DIAGNOSIS — D494 Neoplasm of unspecified behavior of bladder: Secondary | ICD-10-CM | POA: Diagnosis present

## 2018-10-22 DIAGNOSIS — I959 Hypotension, unspecified: Secondary | ICD-10-CM | POA: Diagnosis present

## 2018-10-22 DIAGNOSIS — I471 Supraventricular tachycardia: Secondary | ICD-10-CM | POA: Diagnosis present

## 2018-10-22 DIAGNOSIS — Z8249 Family history of ischemic heart disease and other diseases of the circulatory system: Secondary | ICD-10-CM

## 2018-10-22 DIAGNOSIS — I739 Peripheral vascular disease, unspecified: Secondary | ICD-10-CM | POA: Diagnosis present

## 2018-10-22 DIAGNOSIS — Z7901 Long term (current) use of anticoagulants: Secondary | ICD-10-CM

## 2018-10-22 DIAGNOSIS — Z961 Presence of intraocular lens: Secondary | ICD-10-CM | POA: Diagnosis present

## 2018-10-22 DIAGNOSIS — Z9841 Cataract extraction status, right eye: Secondary | ICD-10-CM

## 2018-10-22 DIAGNOSIS — Z8673 Personal history of transient ischemic attack (TIA), and cerebral infarction without residual deficits: Secondary | ICD-10-CM

## 2018-10-22 DIAGNOSIS — N281 Cyst of kidney, acquired: Secondary | ICD-10-CM | POA: Diagnosis present

## 2018-10-22 LAB — CBC WITH DIFFERENTIAL/PLATELET
Abs Immature Granulocytes: 0.05 10*3/uL (ref 0.00–0.07)
Basophils Absolute: 0 10*3/uL (ref 0.0–0.1)
Basophils Relative: 1 %
Eosinophils Absolute: 0.2 10*3/uL (ref 0.0–0.5)
Eosinophils Relative: 3 %
HCT: 27.7 % — ABNORMAL LOW (ref 39.0–52.0)
Hemoglobin: 8.6 g/dL — ABNORMAL LOW (ref 13.0–17.0)
Immature Granulocytes: 1 %
Lymphocytes Relative: 15 %
Lymphs Abs: 0.9 10*3/uL (ref 0.7–4.0)
MCH: 30.8 pg (ref 26.0–34.0)
MCHC: 31 g/dL (ref 30.0–36.0)
MCV: 99.3 fL (ref 80.0–100.0)
Monocytes Absolute: 0.9 10*3/uL (ref 0.1–1.0)
Monocytes Relative: 15 %
Neutro Abs: 4.1 10*3/uL (ref 1.7–7.7)
Neutrophils Relative %: 65 %
Platelets: 140 10*3/uL — ABNORMAL LOW (ref 150–400)
RBC: 2.79 MIL/uL — ABNORMAL LOW (ref 4.22–5.81)
RDW: 18.7 % — ABNORMAL HIGH (ref 11.5–15.5)
WBC: 6.2 10*3/uL (ref 4.0–10.5)
nRBC: 0 % (ref 0.0–0.2)

## 2018-10-22 LAB — COMPREHENSIVE METABOLIC PANEL
ALK PHOS: 143 U/L — AB (ref 38–126)
ALT: 30 U/L (ref 0–44)
AST: 35 U/L (ref 15–41)
Albumin: 3.2 g/dL — ABNORMAL LOW (ref 3.5–5.0)
Anion gap: 8 (ref 5–15)
BILIRUBIN TOTAL: 0.7 mg/dL (ref 0.3–1.2)
BUN: 42 mg/dL — ABNORMAL HIGH (ref 8–23)
CO2: 23 mmol/L (ref 22–32)
Calcium: 8.6 mg/dL — ABNORMAL LOW (ref 8.9–10.3)
Chloride: 104 mmol/L (ref 98–111)
Creatinine, Ser: 1.85 mg/dL — ABNORMAL HIGH (ref 0.61–1.24)
GFR calc Af Amer: 39 mL/min — ABNORMAL LOW (ref >60)
GFR, EST NON AFRICAN AMERICAN: 34 mL/min — AB (ref >60)
Glucose, Bld: 109 mg/dL — ABNORMAL HIGH (ref 70–99)
Potassium: 4.6 mmol/L (ref 3.5–5.1)
Sodium: 135 mmol/L (ref 135–145)
Total Protein: 6.4 g/dL — ABNORMAL LOW (ref 6.5–8.1)

## 2018-10-22 LAB — URINALYSIS, ROUTINE W REFLEX MICROSCOPIC
Bilirubin Urine: NEGATIVE
Glucose, UA: NEGATIVE mg/dL
Hgb urine dipstick: NEGATIVE
Ketones, ur: NEGATIVE mg/dL
Nitrite: NEGATIVE
Protein, ur: NEGATIVE mg/dL
Specific Gravity, Urine: 1.012 (ref 1.005–1.030)
pH: 6 (ref 5.0–8.0)

## 2018-10-22 LAB — PROTIME-INR
INR: 2.54
Prothrombin Time: 27 seconds — ABNORMAL HIGH (ref 11.4–15.2)

## 2018-10-22 LAB — LACTATE DEHYDROGENASE: LDH: 195 U/L — ABNORMAL HIGH (ref 98–192)

## 2018-10-22 LAB — MAGNESIUM: Magnesium: 2.2 mg/dL (ref 1.7–2.4)

## 2018-10-22 LAB — MRSA PCR SCREENING: MRSA by PCR: NEGATIVE

## 2018-10-22 MED ORDER — PROMETHAZINE HCL 25 MG PO TABS: 12.5000 mg | ORAL_TABLET | Freq: Four times a day (QID) | ORAL | Status: DC | PRN

## 2018-10-22 MED ORDER — ZINC GLUCONATE 50 MG PO TABS: 50.0000 mg | ORAL_TABLET | Freq: Two times a day (BID) | ORAL | Status: DC

## 2018-10-22 MED ORDER — ONDANSETRON HCL 4 MG/2ML IJ SOLN
4.0000 mg | Freq: Four times a day (QID) | INTRAMUSCULAR | Status: DC | PRN
  Administered 2018-10-26: 4 mg via INTRAVENOUS
  Filled 2018-10-22: qty 2

## 2018-10-22 MED ORDER — ZINC SULFATE 220 (50 ZN) MG PO CAPS
220.0000 mg | ORAL_CAPSULE | Freq: Every day | ORAL | Status: DC
  Administered 2018-10-23 – 2018-10-28 (×5): 220 mg via ORAL
  Filled 2018-10-22 (×5): qty 1

## 2018-10-22 MED ORDER — TEMAZEPAM 15 MG PO CAPS
30.0000 mg | ORAL_CAPSULE | Freq: Every evening | ORAL | Status: DC | PRN
  Administered 2018-10-22 – 2018-10-27 (×6): 30 mg via ORAL
  Filled 2018-10-22 (×6): qty 2

## 2018-10-22 MED ORDER — PANTOPRAZOLE SODIUM 40 MG PO TBEC
40.0000 mg | DELAYED_RELEASE_TABLET | Freq: Two times a day (BID) | ORAL | Status: DC
  Administered 2018-10-22 – 2018-10-28 (×11): 40 mg via ORAL
  Filled 2018-10-22 (×11): qty 1

## 2018-10-22 MED ORDER — AMIODARONE HCL 200 MG PO TABS
200.0000 mg | ORAL_TABLET | Freq: Two times a day (BID) | ORAL | Status: DC
  Administered 2018-10-22 – 2018-10-28 (×11): 200 mg via ORAL
  Filled 2018-10-22 (×11): qty 1

## 2018-10-22 MED ORDER — ADULT MULTIVITAMIN W/MINERALS CH
1.0000 | ORAL_TABLET | Freq: Every day | ORAL | Status: DC
  Administered 2018-10-23 – 2018-10-28 (×5): 1 via ORAL
  Filled 2018-10-22 (×5): qty 1

## 2018-10-22 MED ORDER — LEVOTHYROXINE SODIUM 75 MCG PO TABS
150.0000 ug | ORAL_TABLET | Freq: Every day | ORAL | Status: DC
  Administered 2018-10-23 – 2018-10-28 (×6): 150 ug via ORAL
  Filled 2018-10-22 (×6): qty 2

## 2018-10-22 MED ORDER — ENSURE MAX PROTEIN PO LIQD: 11.0000 [oz_av] | Freq: Every day | ORAL | Status: DC

## 2018-10-22 MED ORDER — WARFARIN - PHARMACIST DOSING INPATIENT
Freq: Every day | Status: DC
  Administered 2018-10-26: 18:00:00

## 2018-10-22 MED ORDER — SODIUM CHLORIDE 0.9 % IV SOLN
INTRAVENOUS | Status: DC | PRN
  Administered 2018-10-22: 500 mL via INTRAVENOUS

## 2018-10-22 MED ORDER — PRO-STAT SUGAR FREE PO LIQD: 30.0000 mL | Freq: Two times a day (BID) | ORAL | Status: DC

## 2018-10-22 MED ORDER — DOCUSATE SODIUM 100 MG PO CAPS
100.0000 mg | ORAL_CAPSULE | Freq: Every day | ORAL | Status: DC
  Filled 2018-10-22 (×3): qty 1

## 2018-10-22 MED ORDER — WARFARIN SODIUM 2 MG PO TABS: 2.0000 mg | ORAL_TABLET | Freq: Once | ORAL | Status: DC

## 2018-10-22 MED ORDER — SOTALOL HCL 80 MG PO TABS
80.0000 mg | ORAL_TABLET | Freq: Two times a day (BID) | ORAL | Status: DC
  Administered 2018-10-22 – 2018-10-28 (×11): 80 mg via ORAL
  Filled 2018-10-22 (×14): qty 1

## 2018-10-22 MED ORDER — SODIUM CHLORIDE 0.9 % IV BOLUS
1000.0000 mL | Freq: Once | INTRAVENOUS | Status: AC
  Administered 2018-10-22: 1000 mL via INTRAVENOUS

## 2018-10-22 MED ORDER — PREMIER PROTEIN SHAKE: 11.0000 [oz_av] | Freq: Two times a day (BID) | ORAL | Status: DC

## 2018-10-22 MED ORDER — ACETAMINOPHEN 325 MG PO TABS: 650.0000 mg | ORAL_TABLET | ORAL | Status: DC | PRN

## 2018-10-22 MED ORDER — ATORVASTATIN CALCIUM 80 MG PO TABS
80.0000 mg | ORAL_TABLET | Freq: Every day | ORAL | Status: DC
  Administered 2018-10-22 – 2018-10-27 (×6): 80 mg via ORAL
  Filled 2018-10-22 (×6): qty 1

## 2018-10-22 NOTE — H&P (Addendum)
Advanced Heart Failure VAD History and Physical Note   PCP-Cardiologist: No primary care provider on file.   Reason for Admission: Low flows  HPI:    [name] is a 77 y.o. male  with h/o VT, PAD and severe systolic HF (EF 12-75%) due to mixed ischemic/nonischemic CM he is s/p HM II LVAD implant for destination therapy on October 19, 2013.VAD implantation was complicated by VT, syncope, AF/Aflutter and RHF. Required short term milrinone.   GU Event 09/01/14 had impacted ureteral stone and underwent cystoscopy and flexible ureteroscopy with lithotripsy and basket extraction.  09/2015 Hematuria and chronic nephrolithiasis. Renal US showed mild hyronephrosis and bilateral renal cysts. Dr. Risa Grill took back for repeat lithotripsy and stone extraction by ureteroscopy and J stent placement. 12/18 Recurrent hematuria. 33mm left ureteral stone. Passed with tamulosin 1/19   Admitted with recurrent hematuria with ureteral stone. Stone removed but found to have large bladder mass which was removed. Course c/b obstructive uropathy and AKI requiring 1 session of HD.  3/19 Bladder chemo stopped after 2 treatments as unable to tolerate 05/16/18 s/p TURB with right ureteral stent placement.  11/19 Recurrent bladder tumor excision with persistent hematuria requiring XRT  GI Event  02/2015 - Hgb 5.4 --> EGD that showed 2 AVMs in the jejunum that were clipped.  4/2-18 - Episode of melena. Hgb 10.9-> 8.7. EGD/colonoscopy negative 12/18 - Melena with syncope  Hgb 6.5. Transfused 5u. 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  01/2016 R CEA. He was started on Plavix.  01/2016 Lumbar fusion L3-L5 with pedicle screws posterolateral arthrodesis with BMP, allograft  VT events 9/17 with high fevers and productive cough. Treated with levaquin x 7 days for acute bronchitis. Also found to have VT and underwent DC-CV. Seen by EP and amio increased 10/17 speed turned down  to 8800 due to RV failure. 10/18 with slow VT and ADHF.Cardioverted in ED and amio uptitrated.  04/01/18 recurrent VT. Underwent DC-CV in Er 07/09/18 presented to ED. Urgently cardioverted. EP started on Sotolol.   Significant VAD Events  03/2017>percutaneous lead repair  Hospitalized 09/17/18-10/01/18 for heparin bridge for recurrent bladder tumor resection on 09/19/18.Renal US showed recurrent right hydronephrosis without BOO and evidence of recurrent bladder tumor.Underwent recurrent bladder tumor resection 09/19/18 with Dr Gloriann Loan.Found to be large fast-growing tumor with obstruction of right ureteral orifice(unable to uncover right ureteral orifice). Repeat renal US showed slightly improved right hydronephrosis and new left mild hydronephrosis. Urology offered nephrostomy tube, but he declined.CT abdomen completed and showed bilateral hydronephrosis L>R and was negative for locally invasive disease. 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   He had urinary retention so Foley cath placed. This was later removed but he had catheter placed again. He had large clots so he started continuous irrigation. Dr Gloriann Loan and Dr Tammi Klippel recommended a few days on radiation due to ongoing hematuria. He under went radiation on 11/25, 11/26, and 11/27. Hemoglobin dropped down to 7.8 and Hematuria resolved so foley was removed and he was able to void. He will follow up with Dr Tammi Klippel in January.Ramp echo done 11/14 and speed turned down to 8600.He had intermittent low flows and he was given IV fluids. He has been instructed to continue to drink gatorade because this has been an ongoing problem. INR was followed closely. INR therapeutic on the day of discharge. PICC line was remove on the day of discharge.   Last  seen in LVAD clinic 10/20/18. Felt weak but able to do all ADLs. Multiple low flow alarms noted. Noted to be in NSR. Only change made was to  encourage po intake with NSR and stable Hgb.   He presents today as direct admit from home due to frequent low flow alarms (70+). He states he has no energy whatsoever. Had Nausea and one episode of vomiting last night, about 2.5 hours after eating dinner. [name] was not sick, nor did he have any N/V before or after. He has been trying to maintain adequate po intake. Denies SOB. He denies overt lightheadedness or dizziness, just feels week. Denies BRBPR, but states his stools have been black.   LVAD Interrogation HM 2: Speed: 8400 Flow: 2.6 PI: 6.1 Power: 3.5. > 70 low flow events since this am, frequent yesterday as well.   Review of systems complete and found to be negative unless listed in HPI.    Home Medications Prior to Admission medications   Medication Sig Start Date End Date Taking? Authorizing Provider  amiodarone (PACERONE) 200 MG tablet Take 1 tablet (200 mg total) by mouth 2 (two) times daily. 08/29/18   Bensimhon, Shaune Pascal, MD  atorvastatin (LIPITOR) 80 MG tablet Take 1 tablet (80 mg total) by mouth at bedtime. 08/15/18   Bensimhon, Shaune Pascal, MD  docusate sodium (COLACE) 100 MG capsule Take 100 mg by mouth daily.     [provider]  furosemide (LASIX) 20 MG tablet Take 20 mg by mouth daily as needed for fluid.    [provider]  levothyroxine (SYNTHROID, LEVOTHROID) 150 MCG tablet Take 1 tablet (150 mcg total) by mouth daily before breakfast. 10/25/17   Bensimhon, Shaune Pascal, MD  Multiple Vitamin (MULTIVITAMIN WITH MINERALS) TABS tablet Take 1 tablet by mouth daily.    [provider]  Multiple Vitamins-Minerals (ICAPS PO) Take 1 tablet by mouth 2 (two) times daily.    [provider]  pantoprazole (PROTONIX) 40 MG tablet TAKE 1 TABLET TWICE DAILY Patient taking differently: Take 40 mg by mouth 2 (two) times daily before a meal.  05/05/18   Bensimhon, Shaune Pascal, MD  promethazine (PHENERGAN) 12.5 MG tablet Take 12.5 mg by mouth every 6 (six) hours as  needed for nausea or vomiting.    [provider]  protein supplement shake (PREMIER PROTEIN) LIQD Take 11 oz by mouth 2 (two) times daily.    [provider]  sotalol (BETAPACE) 80 MG tablet Take 1 tablet (80 mg total) by mouth 2 (two) times daily. 07/14/18   Georgiana Shore, NP  temazepam (RESTORIL) 30 MG capsule Take 1 capsule (30 mg total) by mouth at bedtime as needed for sleep. 06/30/18   Bensimhon, Shaune Pascal, MD  warfarin (COUMADIN) 2 MG tablet Take 2 mg by mouth every evening. Except 1 mg (1/2 tablet) on Tuesday    [provider]  zinc gluconate 50 MG tablet Take 50 mg by mouth 2 (two) times daily.     [provider]    Past Medical History: Past Medical History:  Diagnosis Date  . Arthritis   . Automatic implantable cardioverter-defibrillator in situ    a. s/p Medtronic Evera device serial [id number]    . Bradycardia   . CAD (coronary artery disease)    a. s/p MI 1982;  s/p DES to CFX 2011;  s/p NSTEMI 4/12 in setting of VTach;  cath 7/12:  LM ok, LAD mid 30-40%, Dx's with 40%, mCFX stent patent, prox  to mid RCA occluded with R to R and L to R collats.  Medical Tx was continued  . Chronic systolic heart failure ([place])    a. s/p LVAD 10/2013 for destination therapy  . CKD (chronic kidney disease)   . CVD (cerebrovascular disease)    a. s/p L CEA 2008  . Cytopenia   . Dysrhythmia    AFIB  . History of GI bleed    a. on ASA- started on plavix during 10/2015 admission for CVA  . HLD (hyperlipidemia)   . HTN (hypertension)   . Hypothyroidism   . Inappropriate therapy from implantable cardioverter-defibrillator    a. ATP for sinus tach SVT wavelet identified SVT but was no  passive (2014)  . Ischemic cardiomyopathy   . 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 L Ear   . Myocardial infarction ([place])    1992  . PAF (paroxysmal atrial fibrillation) ([place])   . PVD (peripheral vascular disease) ([place])    a. h/o claudication;  s/p R fem to pop BPG 3/11;  s/p L CFA  BPG 7/03;  s/p repair of inf AAA 6/03;  s/p aorta to bilat renal BPG 6/03  . Shortness of breath dyspnea    W/ EXERTION   . Sleep apnea    NO CPAP NOW  HE SENT IT BACK YRS AGO  . Stroke [facility])    a. 10/2015 admission   . Ventricular tachycardia ([place])    a. s/p AICD;   h/o ICD shock;  Amiodarone Rx. S/P ablation in 2012    Past Surgical History: Past Surgical History:  Procedure Laterality Date  . BACK SURGERY    . CARDIAC CATHETERIZATION     "several" (05/25/2013)  . CARDIAC DEFIBRILLATOR PLACEMENT  2003; 2005; 05/25/2013   2014: Medtronic Evera device serial number [id number]  . CARDIAC ELECTROPHYSIOLOGY STUDY AND ABLATION  2012  . CARDIOVERSION N/A 10/15/2013   Procedure: CARDIOVERSION;  Surgeon: Jolaine Artist, MD;  Location: [place];  Service: Cardiovascular;  Laterality: N/A;  . CARDIOVERSION N/A 11/04/2013   Procedure: CARDIOVERSION;  Surgeon: Larey Dresser, MD;  Location: [place];  Service: Cardiovascular;  Laterality: N/A;  . CARDIOVERSION N/A 06/04/2014   Procedure: CARDIOVERSION;  Surgeon: Jolaine Artist, MD;  Location: [facility] ENDOSCOPY;  Service: Cardiovascular;  Laterality: N/A;  . CARDIOVERSION N/A 03/02/2015   Procedure: CARDIOVERSION;  Surgeon: Jolaine Artist, MD;  Location: [facility] ENDOSCOPY;  Service: Cardiovascular;  Laterality: N/A;  . CARDIOVERSION N/A 01/19/2016   Procedure: CARDIOVERSION;  Surgeon: Larey Dresser, MD;  Location: [place];  Service: Cardiovascular;  Laterality: N/A;  . CARDIOVERSION N/A 06/10/2018   Procedure: CARDIOVERSION;  Surgeon: Jolaine Artist, MD;  Location: [facility] ENDOSCOPY;  Service: Cardiovascular;  Laterality: N/A;  . CARDIOVERSION N/A 07/02/2018   Procedure: CARDIOVERSION;  Surgeon: Jolaine Artist, MD;  Location: [place];  Service: Cardiovascular;  Laterality: N/A;  . CAROTID ENDARTERECTOMY Left ~ 2007  . CATARACT EXTRACTION W/ INTRAOCULAR LENS  IMPLANT, BILATERAL Bilateral 2000  . CHOLECYSTECTOMY  12/15/?2010  .  COLONOSCOPY WITH PROPOFOL N/A 05-24-202018   Procedure: COLONOSCOPY WITH PROPOFOL;  Surgeon: Ronald Lobo, MD;  Location: [place];  Service: Endoscopy;  Laterality: N/A;  . CORONARY ANGIOPLASTY  1992  . CORONARY ANGIOPLASTY WITH STENT PLACEMENT     "last one was 11/2012  (05/25/2013)  . CYSTOGRAM N/A 09/19/2018   Procedure: CYSTOGRAM;  Surgeon: Lucas Mallow, MD;  Location: [place];  Service: Urology;  Laterality: N/A;  .  CYSTOSCOPY W/ RETROGRADES Bilateral 05/16/2018   Procedure: CYSTOSCOPY WITH RETROGRADE PYELOGRAM;  Surgeon: Lucas Mallow, MD;  Location: [place];  Service: Urology;  Laterality: Bilateral;  . CYSTOSCOPY WITH RETROGRADE PYELOGRAM, URETEROSCOPY AND STENT PLACEMENT Left 08/09/2014   Procedure: CYSTOSCOPY WITH RETROGRADE PYELOGRAM, URETEROSCOPY AND STENT PLACEMENT;  Surgeon: Bernestine Amass, MD;  Location: [facility];  Service: Urology;  Laterality: Left;  . CYSTOSCOPY WITH RETROGRADE PYELOGRAM, URETEROSCOPY AND STENT PLACEMENT Left 09/01/2014   Procedure: CYSTOSCOPY WITH URETEROSCOPY AND STENT REMOVAL;  Surgeon: Bernestine Amass, MD;  Location: [facility];  Service: Urology;  Laterality: Left;  . CYSTOSCOPY WITH RETROGRADE PYELOGRAM, URETEROSCOPY AND STENT PLACEMENT Left 09/20/2014   Procedure: CYSTOSCOPY WITH RETROGRADE PYELOGRAM, URETEROSCOPY AND STENT PLACEMENT, stone removal;  Surgeon: Bernestine Amass, MD;  Location[facility];  Service: Urology;  Laterality: Left;  . CYSTOSCOPY WITH RETROGRADE PYELOGRAM, URETEROSCOPY AND STENT PLACEMENT Bilateral 12/04/2017   Procedure: CYSTOSCOPY WITH BILATERAL RETROGRADE PYELOGRAM, LEFT URETEROSCOPY AND STENT PLACEMENT;  Surgeon: Lucas Mallow, MD;  Location: [place];  Service: Urology;  Laterality: Bilateral;  . CYSTOSCOPY WITH RETROGRADE PYELOGRAM, URETEROSCOPY AND STENT PLACEMENT Left 09/19/2018   Procedure: left  RETROGRADE PYELOGRAM;  Surgeon: Lucas Mallow, MD;  Location: [place];  Service: Urology;  Laterality: Left;  . DOPPLER  ECHOCARDIOGRAPHY  2011  . ELBOW ARTHROSCOPY Right 1990's  . ELECTROPHYSIOLOGIC STUDY N/A 07/19/2016   Procedure: Cardioversion;  Surgeon: Evans Lance, MD;  Location: [place] CV LAB;  Service: Cardiovascular;  Laterality: N/A;  . ENDARTERECTOMY Right 01/13/2016   Procedure: RIGHT CAROTID ARTERY ENDARTERECTOMY;  Surgeon: Mal Misty, MD;  Location: [place];  Service: Vascular;  Laterality: Right;  . ENTEROSCOPY Left 02/23/2017   Procedure: ENTEROSCOPY;  Surgeon: Ronald Lobo, MD;  Location: [facility] ENDOSCOPY;  Service: Endoscopy;  Laterality: Left;  . ENTEROSCOPY N/A 10/17/2017   Procedure: ENTEROSCOPY;  Surgeon: Yetta Flock, MD;  Location: [facility] ENDOSCOPY;  Service: Gastroenterology;  Laterality: N/A;  LVAD pt.    . ESOPHAGOGASTRODUODENOSCOPY N/A 02/15/2015   Procedure: ESOPHAGOGASTRODUODENOSCOPY (EGD);  Surgeon: Carol Ada, MD;  Location: [facility] ENDOSCOPY;  Service: Endoscopy;  Laterality: N/A;  LVAD  . EYE SURGERY     VITRECTOMY  LEFT 05/2012  . FEMORAL-POPLITEAL BYPASS GRAFT Right 2011  . FEMORAL-POPLITEAL BYPASS GRAFT Left 05/2002   Archie Endo 05/06/2002 (05/25/2013)  . HEEL SPUR SURGERY Right 1990's  . HOLMIUM LASER APPLICATION Left 09/04/5944   Procedure: HOLMIUM LASER APPLICATION;  Surgeon: Bernestine Amass, MD;  [facility];  Service: Urology;  Laterality: Left;  . HOLMIUM LASER APPLICATION Left 8/59/2924   Procedure: HOLMIUM LASER APPLICATION;  Surgeon: Lucas Mallow, MD;  Location: [place];  Service: Urology;  Laterality: Left;  . IMPLANTABLE CARDIOVERTER DEFIBRILLATOR GENERATOR CHANGE N/A 05/25/2013   Procedure: IMPLANTABLE CARDIOVERTER DEFIBRILLATOR GENERATOR CHANGE;  Surgeon: Deboraha Sprang, MD;  Location: [facility] CATH LAB;  Service: Cardiovascular;  Laterality: N/A;  . INSERTION OF IMPLANTABLE LEFT VENTRICULAR ASSIST DEVICE N/A 10/19/2013   Procedure: INSERTION OF IMPLANTABLE LEFT VENTRICULAR ASSIST DEVICE;  Surgeon: Gaye Pollack, MD;  Location: [place];  Service: Open Heart  Surgery;  Laterality: N/A;  CIRC ARREST  NITRIC OXIDE  MEDTRONIC ICD  . INTRAOPERATIVE TRANSESOPHAGEAL ECHOCARDIOGRAM N/A 10/19/2013   Procedure: INTRAOPERATIVE TRANSESOPHAGEAL ECHOCARDIOGRAM;  Surgeon: Gaye Pollack, MD;  Location: [facility] OR;  Service: Open Heart Surgery;  Laterality: N/A;  . IR FLUORO GUIDE CV LINE RIGHT  12/10/2017  . IR US GUIDE  VASC ACCESS RIGHT  12/10/2017  . KNEE ARTHROSCOPY Bilateral 1990's  . LEAD REVISION N/A 06/05/2013   Procedure: LEAD REVISION;  Surgeon: Thompson Grayer, MD;  Location: [facility] CATH LAB;  Service: Cardiovascular;  Laterality: N/A;  . LEFT HEART CATHETERIZATION WITH CORONARY ANGIOGRAM N/A 11/24/2012   Procedure: LEFT HEART CATHETERIZATION WITH CORONARY ANGIOGRAM;  Surgeon: Peter M Martinique, MD;  Location: [facility] CATH LAB;  Service: Cardiovascular;  Laterality: N/A;  . LIPOMA EXCISION Left 2013   "near ear" (05/25/2013)  . [place]; 2000's  . PATCH ANGIOPLASTY Right 01/13/2016   Procedure: WITH  PATCH ANGIOPLASTY;  Surgeon: Mal Misty, MD;  Location: [place];  Service: Vascular;  Laterality: Right;  . RENAL ARTERY BYPASS Bilateral 2003  . REVASCULARIZATION / IN-SITU GRAFT LEG    . RIGHT HEART CATH N/A 11/29/2017   Procedure: RIGHT HEART CATH;  Surgeon: Jolaine Artist, MD;  Location: [place] CV LAB;  Service: Cardiovascular;  Laterality: N/A;  . RIGHT HEART CATHETERIZATION N/A 09/17/2013   Procedure: RIGHT HEART CATH;  Surgeon: Jolaine Artist, MD;  Location: [facility] CATH LAB;  Service: Cardiovascular;  Laterality: N/A;  . RIGHT HEART CATHETERIZATION N/A 10/14/2013   Procedure: RIGHT HEART CATH;  Surgeon: Jolaine Artist, MD;  Location: [facility] CATH LAB;  Service: Cardiovascular;  Laterality: N/A;  . RIGHT HEART CATHETERIZATION N/A 11/16/2013   Procedure: RIGHT HEART CATH;  Surgeon: Jolaine Artist, MD;  Location: [facility] CATH LAB;  Service: Cardiovascular;  Laterality: N/A;  . RIGHT HEART CATHETERIZATION N/A 07/13/2014   Procedure: RIGHT HEART CATH;   Surgeon: Jolaine Artist, MD;  Location: [facility] CATH LAB;  Service: Cardiovascular;  Laterality: N/A;  . TEE WITHOUT CARDIOVERSION N/A 11/04/2013   Procedure: TRANSESOPHAGEAL ECHOCARDIOGRAM (TEE);  Surgeon: Larey Dresser, MD;  Location: [place];  Service: Cardiovascular;  Laterality: N/A;  . TEE WITHOUT CARDIOVERSION N/A 03/02/2015   Procedure: TRANSESOPHAGEAL ECHOCARDIOGRAM (TEE);  Surgeon: Jolaine Artist, MD;  Location: [facility] ENDOSCOPY;  Service: Cardiovascular;  Laterality: N/A;  . TEE WITHOUT CARDIOVERSION N/A 01/19/2016   Procedure: TRANSESOPHAGEAL ECHOCARDIOGRAM (TEE);  Surgeon: Larey Dresser, MD;  Location: [place];  Service: Cardiovascular;  Laterality: N/A;  . TONSILLECTOMY  1946  . TRANSURETHRAL RESECTION OF BLADDER TUMOR N/A 12/04/2017   Procedure: TRANSURETHRAL RESECTION OF BLADDER TUMOR (TURBT);  Surgeon: Lucas Mallow, MD;  Location: [place];  Service: Urology;  Laterality: N/A;  . TRANSURETHRAL RESECTION OF BLADDER TUMOR N/A 05/16/2018   Procedure: TRANSURETHRAL RESECTION OF BLADDER TUMOR (TURBT);  Surgeon: Lucas Mallow, MD;  Location: [place];  Service: Urology;  Laterality: N/A;  . TRANSURETHRAL RESECTION OF BLADDER TUMOR WITH MITOMYCIN-C N/A 09/19/2018   Procedure: TRANSURETHRAL RESECTION OF BLADDER TUMOR;  Surgeon: Lucas Mallow, MD;  Location: [place];  Service: Urology;  Laterality: N/A;  . TUMOR EXCISION Left 1960's   "fatty tumor" (05/25/2013)  . V-TACH ABLATION N/A 09/12/2011   Procedure: V-TACH ABLATION;  Surgeon: Evans Lance, MD;  Location: [facility] CATH LAB;  Service: Cardiovascular;  Laterality: N/A;  . V-TACH ABLATION N/A 06/08/2013   Procedure: V-TACH ABLATION;  Surgeon: Evans Lance, MD;  Location: [facility] CATH LAB;  Service: Cardiovascular;  Laterality: N/A;    Family History: Family History  Problem Relation Age of Onset  . Heart attack Mother   . Coronary artery disease Mother   . Cancer Mother   . Heart disease Mother   . Hyperlipidemia Mother    .  Hypertension Mother   . Coronary artery disease Father   . Stroke Father   . Cancer Father   . Heart disease Father        before age 9  . Hyperlipidemia Father   . Hypertension Father   . Heart attack Father   . Coronary artery disease Other     Social History: Social History   Socioeconomic History  . Marital status: Divorced    Spouse name: Not on file  . Number of children: 2  . Years of education: Not on file  . Highest education level: Not on file  Occupational History  . Occupation: retired    Comment: Health visitor  . Occupation: Retired    Comment: Security [employer]  . Occupation: Part-time    Comment: Security at [employer]  . Financial resource strain: Not on file  . Food insecurity:    Worry: Not on file    Inability: Not on file  . Transportation needs:    Medical: No    Non-medical: No  Tobacco Use  . Smoking status: Former Smoker    Packs/day: 0.50    Years: 37.00    Pack years: 18.50    Types: Cigarettes    Last attempt to quit: 11/05/2001    Years since quitting: 16.9  . Smokeless tobacco: Never Used  Substance and Sexual Activity  . Alcohol use: Yes    Alcohol/week: 0.0 standard drinks    Comment: 05/25/2013 "mixed drink couple times/month"  . Drug use: No  . Sexual activity: Never  Lifestyle  . Physical activity:    Days per week: Not on file    Minutes per session: Not on file  . Stress: Not on file  Relationships  . Social connections:    Talks on phone: Not on file    Gets together: Not on file    Attends religious service: Not on file    Active member of club or organization: Not on file    Attends meetings of clubs or organizations: Not on file    Relationship status: Not on file  Other Topics Concern  . Not on file  Social History Narrative   Lives with sig other.    Allergies:  Allergies  Allergen Reactions  . Demerol [Meperidine] Other (See Comments)    Paralysis/ Could only move eyes     Objective:    Vital Signs:   HR 72 BP 73/51 SPO2 100% on RA  Mean arterial Pressure 50s  Physical Exam    General:  Fatigued and chronically ill appearing.  HEENT: Normal Neck: supple. JVP 5-6 cm. Carotids 2+ bilat; no bruits. No lymphadenopathy or thyromegaly appreciated. Cor: Mechanical heart sounds with LVAD hum present. Lungs: Clear Abdomen: soft, nontender, nondistended. No hepatosplenomegaly. No bruits or masses. Good bowel sounds. Driveline: C/D/I; securement device intact and driveline incorporated Extremities: no cyanosis, clubbing, rash, edema Neuro: alert & orientedx3, cranial nerves grossly intact. moves all 4 extremities w/o difficulty. Affect pleasant  Telemetry   Appears to be in NSR 60-70s, personally reviewed.  EKG   Pending.  Labs    Basic Metabolic Panel: Recent Labs  Lab 10/20/18 1422  NA 133*  K 4.7  CL 100  CO2 24  GLUCOSE 101*  BUN 42*  CREATININE 1.95*  CALCIUM 8.6*  MG 2.2    Liver Function Tests: Recent Labs  Lab 10/20/18 1422  AST 38  ALT 33  ALKPHOS 153*  BILITOT 0.4  PROT  7.1  ALBUMIN 3.5   No results for input(s): LIPASE, AMYLASE in the last 168 hours. No results for input(s): AMMONIA in the last 168 hours.  CBC: Recent Labs  Lab 10/20/18 1422  WBC 5.5  HGB 9.5*  HCT 31.6*  MCV 101.6*  PLT 164    Cardiac Enzymes: No results for input(s): CKTOTAL, CKMB, CKMBINDEX, TROPONINI in the last 168 hours.  BNP: BNP (last 3 results) No results for input(s): BNP in the last 8760 hours.  ProBNP (last 3 results) No results for input(s): PROBNP in the last 8760 hours.   CBG: No results for input(s): GLUCAP in the last 168 hours.  Coagulation Studies: Recent Labs    10/20/18 1422  LABPROT 26.4*  INR 2.47   Imaging     No results found.  Patient Profile:   [name] is a 77 y.o. male with h/o VT, PAD and severe systolic HF (EF 84-16%) due to mixed ischemic/nonischemic CM he is s/p HM II LVAD  implant for destination therapy on October 19, 2013.VAD implantation was complicated by VT, syncope, AF/Aflutter and RHF. Required short term milrinone. Has had very complicated course recently with recurrent bladder tumors.   Direct admitted from home with frequent low flows and malaise.   Assessment/Plan:    1. Low Flow Alarms on LVAD.  - VAD interrogated personally. Has had very frequent low flow events. Over 100 in past 48 hours. Hgb pending.  - NSR by tele. Device interrogation pending.  - VAD speed turned down to 8400 10/20/18 with no improvement.  - INR and LDH pending.  - Afebrile. Labs and UA pending.  2. Chronic systolic HF: s/p HM-II LVAD 10/2014. s/p previous driveline [id number]. No further pump stops. Nolic in 1/19 with mildly low output. Started on milrinone without benefitso it was stopped.  - Remains very weak with NYHA III-IIIb symptoms in setting of treatment for recurrent bladder tumor - Now with multiple low flow alarms on VAD. He is dry on exam. Will give IV fluids and follow. Labwork pending for etiology. - VAD speed turned down to 8400 3. Recurrent high-grade bladder tumor:  - S/p stone removal. 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 Had 2 BCG infusions with Dr. Gloriann Loan but discontinued due to inability to tolerate. - S/p repeat resection 7/19.  - s/p repeat bladder tumor excision in 11/19 with fast-growing tumor. C/b bilateral hydronephrosis and persistent hematuria requiring XRT. - Now planning palliative XRT with Dr. Tammi Klippel. No further hematuria - No change to current plan.   4. CKD, stage III: Had AKI in setting of obstructive uropathy and ATN. Required HD x1(11/2017).  - Cr pending.  - Not interested in nephrostomy tubes for hydronephrosis 5.RecurrentVTwith hypotension and low flow alarms on VAD: Underwent DC-CV 9/17, 08/17/17, and 04/01/18. S/p emergent DC-CV in ER 07/09/18  -EKG pending. Appears to be in NSR by tele.  - Continue  amio. May need to stop sotalol soon or decrease dose if creatinine worsens.  - Keep K > 4.0 Mg > 2.0 6. GIB: s/p clipping of 2 jejunal AVMs in 4/16. Continue coumadin and plavix.(with h/o CVA). Admitted 4/18 with recurrent GIB. Transfused 2u. EGD/colon negative. Admitted 12./18. hgb 6.5, Transfused 5u. EGD with 2 AVMs treated with APC.  - Hgb pending today. No evidence of active bleeding  - Hadbeen getting octreotide infusions but too expensive for him. Willcontinue to hold for now 7. Atrial arrhythmias: Atrial fibrillation, atrial tachycardia. s/p multiple DC-CVs last 07/02/18. A-paced  with long 1st degree AVB.  - Continueamiodarone and sotalol. Labs pending. Check EKG.  8. HTN: - MAPs low. Giving fluid as above.   9. Anticoagulation management:  - Goal INR 2-2.5. INR pending. Dosing per Pharm-D.  10. Lumbar compression fracture: S/p L3-L5 fusion 02/03/16. - Stable.  11. 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 S/p R CEA 01/13/16. Plavix stopped 12/18 due to recurrent GIB. - Continue atorvastatin (andwarfarin INR goal 2-2.5). - No change to current plan.   12. H/o amio-induced hypethyroidism: Followed by Dr. Forde Dandy in past.  - Amio restarted due to recurrent atrial arrhythmias and VT. - Follow.   I reviewed the LVAD parameters from today, and compared the results to the patient's prior recorded data.  No programming changes were made.  The LVAD is functioning within specified parameters.  The patient performs LVAD self-test daily.  LVAD equipment check completed and is in good working order.  Back-up equipment present.   LVAD education done on emergency procedures and precautions and reviewed exit site care.  Length of Stay: 0  Annamaria Helling 10/22/2018, 9:48 AM  VAD Team Pager [phone] (7am - 7am) +++VAD ISSUES ONLY+++   Advanced Heart Failure Team Pager [phone] (M-F; [place])  Please contact [facility] for night-coverage after hours (4p -7a ) and weekends  on [email or web address] for all non- LVAD Issues  Patient seen with PA, agree with the above note.   Patient presented from home with multiple low flow events, low PI.  Hgb mildly lower at 8.6.  Stool is dark, which is new.  No diarrhea, only 1 episode of nausea/vomiting.  Poor po intake.  LDH 195.  Creatinine 1.85, baseline. No VT on device interrogation.   He got 1 L NS in ER and feels much better, PI back up to 6.   On exam, JVP 9-10 cm.  Normal LVAD sounds.  No edema.   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  He has tried milrinone in the past but did not feel like it helped.  Poor po intake and likely blood loss combined with RV dysfunction probably caused the low flows.   - He has had 1 L NS with improvement in PI, feels better.  Encourage po intake.   - Follow CBC and transfuse hgb < 8.  Check Fe studies.   - He did not feel like milrinone helped in the past, so not sure that milrinone or dobutamine gtt for home for RV support would be useful here.  - INR 2.5, will hold warfarin for now in case endoscopy is needed.   Loralie Champagne 10/22/2018 1:18 PM

## 2018-10-22 NOTE — Plan of Care (Signed)
  Problem: Education: Goal: Knowledge of General Education information will improve Description: Including pain rating scale, medication(s)/side effects and non-pharmacologic comfort measures Outcome: Progressing   Problem: Activity: Goal: Risk for activity intolerance will decrease Outcome: Progressing   Problem: Nutrition: Goal: Adequate nutrition will be maintained Outcome: Progressing   Problem: Coping: Goal: Level of anxiety will decrease Outcome: Progressing   

## 2018-10-22 NOTE — Progress Notes (Addendum)
LVAD Coordinator Rounding Note:  Admitted 10/22/18 from home with continuous low flow alarms since 0630 this morning. Patient reports feeling weak. Also reports having black stools for 2 days. Lab work pending.    HM II Implanted 10/19/13 by Dr. Cyndia Bent under Destination Therapy criteria.   Vital signs: Temp:  97.5 HR: 75 regular Doppler Pressure:78 Automatic cuff: 69/54 (61)  (Order in for 1L NS bolus) O2 Sat: 100%on RA  Weight: 80.5kg  LVAD interrogation reveals:  Speed: 8400  Flow:2.8 Power: 3.6w PI: 4.7  Alarms:  70+ PI events since 0630 this AM Events:10-20 PI events  Fixed speed: 8400 Low speed limit: 8000  Drive Line: Weekly dressings performed by bedside nurse or SO [name]. Existing VAD dressing removed and site care performed using sterile technique. Drive line exit site cleaned with Chlora prep applicators x 2, allowed to dry, and Sorbaview dressing with bio patch re-applied. Exit site healed and incorporated, the velour is fully implanted at exit site. No redness, tenderness, drainage, foul odor or rash noted. Drive line anchor re-applied. Next dressing change due 10/29/18.   Labs:  LDH trend: 195>  INR trend: 2.54>   Creat trend: 1.85>  Blood products this admission:   Anticoagulation Plan: - INR Goal: 2.0 - 2.5  - ASA Dose: no ASA (removed 02/2017 due to GI bleed)   Device: -Dual Medtronic ICD - VF therapies: on at 200 bpm - AT/AF monitor on: 140 bpm  - Last device check 09/30/18 per Tomi Bamberger; underlying rhythm [place]  Significant Events on VAD Support:  09/01/14>>ureteral stone with cystoscopy and flexible ureteroscopy with lithotripsy and basket extraction 02/2015 >>GIB with 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 Started on Plavix 3/17>>Lumbar fusion L3-L5 9/17>>VT with DC-CV; amio  increased 10/17>>ramp with RV failure; speed decreased to 8800 02/2017> GIB (removed ASA, scopes negative) 03/2017>percutaneous lead repair after pump stops  10/18>>Slow VT. Cardioverted in ED and amiodarone uptitrated 12/18>>melena with scope. Hgb 6.5. EGD with 2 AVMs with APC 12/18>>recurrent hematuria. 6 mm left ureteral stone 1/19>>7 mm left ureteral stone removal with laser lithotripsy; left ureteral stent. Resection of 6 cm bladder tumor 11/19>>Bladder tumor resections. Gross hematuria requiring CBI and radiation treatments.   Plan/Recommendations:  1. Please call VAD pager if any issues with VAD equipment or questions, concerns.    Emerson Monte RN [place] Coordinator  Office: [phone]  24/7 Pager: [phone]

## 2018-10-22 NOTE — Progress Notes (Addendum)
ANTICOAGULATION CONSULT NOTE - Initial Consult  Pharmacy Consult for warfarin Indication: LVAD  Allergies  Allergen Reactions  . Demerol [Meperidine] Other (See Comments)    Paralysis/ Could only move eyes    Patient Measurements: Height: 6' (182.9 cm) IBW/kg (Calculated) : 77.6  Vital Signs: BP: 100/88 (12/18 1200) Pulse Rate: 74 (12/18 1200)  Labs: Recent Labs    10/20/18 1422 10/22/18 1142  HGB 9.5* 8.6*  HCT 31.6* 27.7*  PLT 164 140*  LABPROT 26.4* 27.0*  INR 2.47 2.54  CREATININE 1.95*  --     Estimated Creatinine Clearance: 33.7 mL/min (A) (by C-G formula based on SCr of 1.95 mg/dL (H)).   Medical History: Past Medical History:  Diagnosis Date  . Arthritis   . Automatic implantable cardioverter-defibrillator in situ    a. s/p Medtronic Evera device serial [id number]    . Bradycardia   . CAD (coronary artery disease)    a. s/p MI 1982;  s/p DES to CFX 2011;  s/p NSTEMI 4/12 in setting of VTach;  cath 7/12:  LM ok, LAD mid 30-40%, Dx's with 40%, mCFX stent patent, prox to mid RCA occluded with R to R and L to R collats.  Medical Tx was continued  . Chronic systolic heart failure ([place])    a. s/p LVAD 10/2013 for destination therapy  . CKD (chronic kidney disease)   . CVD (cerebrovascular disease)    a. s/p L CEA 2008  . Cytopenia   . Dysrhythmia    AFIB  . History of GI bleed    a. on ASA- started on plavix during 10/2015 admission for CVA  . HLD (hyperlipidemia)   . HTN (hypertension)   . Hypothyroidism   . Inappropriate therapy from implantable cardioverter-defibrillator    a. ATP for sinus tach SVT wavelet identified SVT but was no  passive (2014)  . Ischemic cardiomyopathy   . 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 L Ear   . Myocardial infarction ([place])    1992  . PAF (paroxysmal atrial fibrillation) ([place])   . PVD (peripheral vascular disease) ([place])    a. h/o claudication;  s/p R fem to pop BPG 3/11;  s/p L CFA BPG 7/03;  s/p repair of inf AAA 6/03;  s/p  aorta to bilat renal BPG 6/03  . Shortness of breath dyspnea    W/ EXERTION   . Sleep apnea    NO CPAP NOW  HE SENT IT BACK YRS AGO  . Stroke [facility])    a. 10/2015 admission   . Ventricular tachycardia ([place])    a. s/p AICD;   h/o ICD shock;  Amiodarone Rx. S/P ablation in 2012     Assessment: 7 yoM with HM II LVAD admitted with low flow alarms. Pt on warfarin with last dose at home yesterday. INR on admit 2.54 slightly above goal. H/H low but appears stable to baseline.  *PTA Dose = 1mg  Tues, 2mg  all other days  Goal of Therapy:  INR 2-2.5 Monitor platelets by anticoagulation protocol: Yes   Plan:  -Warfarin 2mg  PO x1 tonight -Daily protime  ADDENDUM: Pt reporting black stools and H/H down slightly as noted above. Per HF team will hold warfarin tonight.  Arrie Senate, PharmD, BCPS Clinical Pharmacist [phone] Please check AMION for all [place] numbers 10/22/2018

## 2018-10-23 ENCOUNTER — Other Ambulatory Visit: Payer: Self-pay

## 2018-10-23 ENCOUNTER — Telehealth (HOSPITAL_COMMUNITY): Payer: Self-pay | Admitting: Licensed Clinical Social Worker

## 2018-10-23 ENCOUNTER — Ambulatory Visit: Payer: Medicare Other

## 2018-10-23 ENCOUNTER — Encounter (HOSPITAL_COMMUNITY): Payer: Self-pay

## 2018-10-23 DIAGNOSIS — K5520 Angiodysplasia of colon without hemorrhage: Secondary | ICD-10-CM

## 2018-10-23 DIAGNOSIS — I5022 Chronic systolic (congestive) heart failure: Secondary | ICD-10-CM

## 2018-10-23 DIAGNOSIS — D649 Anemia, unspecified: Secondary | ICD-10-CM

## 2018-10-23 DIAGNOSIS — T829XXA Unspecified complication of cardiac and vascular prosthetic device, implant and graft, initial encounter: Secondary | ICD-10-CM

## 2018-10-23 DIAGNOSIS — Z9889 Other specified postprocedural states: Secondary | ICD-10-CM

## 2018-10-23 DIAGNOSIS — R195 Other fecal abnormalities: Secondary | ICD-10-CM

## 2018-10-23 LAB — CBC
HCT: 25.2 % — ABNORMAL LOW (ref 39.0–52.0)
HEMOGLOBIN: 7.9 g/dL — AB (ref 13.0–17.0)
MCH: 31.1 pg (ref 26.0–34.0)
MCHC: 31.3 g/dL (ref 30.0–36.0)
MCV: 99.2 fL (ref 80.0–100.0)
Platelets: 129 10*3/uL — ABNORMAL LOW (ref 150–400)
RBC: 2.54 MIL/uL — ABNORMAL LOW (ref 4.22–5.81)
RDW: 18.9 % — ABNORMAL HIGH (ref 11.5–15.5)
WBC: 4.2 10*3/uL (ref 4.0–10.5)
nRBC: 0 % (ref 0.0–0.2)

## 2018-10-23 LAB — HEMOGLOBIN AND HEMATOCRIT, BLOOD
HCT: 28.9 % — ABNORMAL LOW (ref 39.0–52.0)
Hemoglobin: 9 g/dL — ABNORMAL LOW (ref 13.0–17.0)

## 2018-10-23 LAB — FOLATE: Folate: 30.8 ng/mL (ref >5.9)

## 2018-10-23 LAB — LACTATE DEHYDROGENASE: LDH: 178 U/L (ref 98–192)

## 2018-10-23 LAB — BASIC METABOLIC PANEL
Anion gap: 10 (ref 5–15)
BUN: 38 mg/dL — ABNORMAL HIGH (ref 8–23)
CHLORIDE: 105 mmol/L (ref 98–111)
CO2: 22 mmol/L (ref 22–32)
Calcium: 8.7 mg/dL — ABNORMAL LOW (ref 8.9–10.3)
Creatinine, Ser: 1.8 mg/dL — ABNORMAL HIGH (ref 0.61–1.24)
GFR calc Af Amer: 41 mL/min — ABNORMAL LOW (ref >60)
GFR calc non Af Amer: 35 mL/min — ABNORMAL LOW (ref >60)
Glucose, Bld: 89 mg/dL (ref 70–99)
Potassium: 4.8 mmol/L (ref 3.5–5.1)
Sodium: 137 mmol/L (ref 135–145)

## 2018-10-23 LAB — IRON AND TIBC
Iron: 45 ug/dL (ref 45–182)
SATURATION RATIOS: 15 % — AB (ref 17.9–39.5)
TIBC: 291 ug/dL (ref 250–450)
UIBC: 246 ug/dL

## 2018-10-23 LAB — URINE CULTURE: Culture: NO GROWTH

## 2018-10-23 LAB — PROTIME-INR
INR: 2.37
Prothrombin Time: 25.5 seconds — ABNORMAL HIGH (ref 11.4–15.2)

## 2018-10-23 LAB — RETICULOCYTES
Immature Retic Fract: 27.2 % — ABNORMAL HIGH (ref 2.3–15.9)
RBC.: 2.54 MIL/uL — ABNORMAL LOW (ref 4.22–5.81)
Retic Count, Absolute: 93.2 10*3/uL (ref 19.0–186.0)
Retic Ct Pct: 3.7 % — ABNORMAL HIGH (ref 0.4–3.1)

## 2018-10-23 LAB — FERRITIN: Ferritin: 60 ng/mL (ref 24–336)

## 2018-10-23 LAB — VITAMIN B12: Vitamin B-12: 623 pg/mL (ref 180–914)

## 2018-10-23 LAB — PREPARE RBC (CROSSMATCH)

## 2018-10-23 MED ORDER — SODIUM CHLORIDE 0.9% IV SOLUTION: Freq: Once | INTRAVENOUS | Status: AC

## 2018-10-23 MED ORDER — BOOST / RESOURCE BREEZE PO LIQD CUSTOM
1.0000 | Freq: Two times a day (BID) | ORAL | Status: DC
  Administered 2018-10-23 – 2018-10-28 (×7): 1 via ORAL

## 2018-10-23 NOTE — Telephone Encounter (Signed)
CSW was asked by caregiver Alen Blew to assist with multiple bills that they had received as denials. CSW contacted Larene Beach in Billing @ [phone] who states most of the previous bills have been paid by the insurance company and only last hospitalization is still pending. Larene Beach asked for [name] (caregiver) to contact her and she will review and discuss further. CSW followed up with marie and provided Shannon's contact number. [name], [place], [place]

## 2018-10-23 NOTE — Progress Notes (Signed)
ANTICOAGULATION CONSULT NOTE - [place] for warfarin Indication: LVAD  Allergies  Allergen Reactions  . Demerol [Meperidine] Other (See Comments)    Paralysis/ Could only move eyes    Patient Measurements: Height: 6' (182.9 cm) Weight: 169 lb 14.4 oz (77.1 kg) IBW/kg (Calculated) : 77.6  Vital Signs: Temp: 98.2 F (36.8 C) (12/19 0400) Temp Source: Oral (12/19 0400) BP: 87/69 (12/19 0700) Pulse Rate: 74 (12/19 0700)  Labs: Recent Labs    10/20/18 1422 10/22/18 1142 10/23/18 0238  HGB 9.5* 8.6* 7.9*  HCT 31.6* 27.7* 25.2*  PLT 164 140* 129*  LABPROT 26.4* 27.0* 25.5*  INR 2.47 2.54 2.37  CREATININE 1.95* 1.85* 1.80*    Estimated Creatinine Clearance: 36.3 mL/min (A) (by C-G formula based on SCr of 1.8 mg/dL (H)).   Medical History: Past Medical History:  Diagnosis Date  . Arthritis   . Automatic implantable cardioverter-defibrillator in situ    a. s/p Medtronic Evera device serial [id number]    . Bradycardia   . CAD (coronary artery disease)    a. s/p MI 1982;  s/p DES to CFX 2011;  s/p NSTEMI 4/12 in setting of VTach;  cath 7/12:  LM ok, LAD mid 30-40%, Dx's with 40%, mCFX stent patent, prox to mid RCA occluded with R to R and L to R collats.  Medical Tx was continued  . Chronic systolic heart failure ([place])    a. s/p LVAD 10/2013 for destination therapy  . CKD (chronic kidney disease)   . CVD (cerebrovascular disease)    a. s/p L CEA 2008  . Cytopenia   . Dysrhythmia    AFIB  . History of GI bleed    a. on ASA- started on plavix during 10/2015 admission for CVA  . HLD (hyperlipidemia)   . HTN (hypertension)   . Hypothyroidism   . Inappropriate therapy from implantable cardioverter-defibrillator    a. ATP for sinus tach SVT wavelet identified SVT but was no  passive (2014)  . Ischemic cardiomyopathy   . 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 L Ear   . Myocardial infarction ([place])    1992  . PAF (paroxysmal atrial fibrillation) ([place])    . PVD (peripheral vascular disease) ([place])    a. 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 Shortness of breath dyspnea    W/ EXERTION   . Sleep apnea    NO CPAP NOW  HE SENT IT BACK YRS AGO  . Stroke [facility])    a. 10/2015 admission   . Ventricular tachycardia ([place])    a. s/p AICD;   h/o ICD shock;  Amiodarone Rx. S/P ablation in 2012     Assessment: 20 yoM with HM II LVAD admitted with low flow alarms. Pt on warfarin with last dose at home yesterday. INR on admit 2.54 down slightly to 2.37, LDH stable, H/H trending down.  *PTA Dose = 1mg  Tues, 2mg  all other days  Goal of Therapy:  INR 2-2.5 Monitor platelets by anticoagulation protocol: Yes   Plan:  -Holding warfarin for now with H/H trend -Follow protime  Arrie Senate, PharmD, BCPS Clinical Pharmacist [phone] Please check AMION for all [place] numbers 10/23/2018

## 2018-10-23 NOTE — Progress Notes (Signed)
Patient scheduled to start radiation therapy today. Unfortunately, patient has been hospitalized with anemia and black tarry stool. New start appointment moved to Monday, December 23. Dr. Tammi Klippel and L1 treatment team aware.

## 2018-10-23 NOTE — Consult Note (Signed)
Consultation  Referring Provider: Dr Haroldine Laws Primary Care Physician:  Haroldine Laws Shaune Pascal, MD Primary Gastroenterologist:  Dr.Armbruster  Reason for Consultation:  Anemia, black stool  HPI: [name] is a 77 y.o. male, who we are asked to see regarding anemia, with drop in hemoglobin from 9.5-7.9 over the last couple of weeks.  This is been associated with black stools x 3 days  Noted per pt. Patient has complicated past medical history, has severe congestive heart failure with EF of 20% and mixed ischemic cardiomyopathy.  He is status post AICD placement and had LVAD placement with HM II  in December 2014.  He is maintained on Coumadin,..  He has history of recurrent V. tach, and peripheral arterial disease. Patient is status post TURB and right ureteral stent placement in July 2019.  He was found to have a bladder mass which was excised in January 2019 and unfortunately has had a recent recurrence and had this excised in November 2019.  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 He needs to have radiation once cardiac status improves. Patient was seen in CHF clinic on 10/20/2018 feeling generally poorly and very weak.  He began having increase in low flow alarms over the next 24 hours and was admitted from home yesterday.  He had had an episode of nausea and vomiting without evidence of heme, and stated that he had noticed that his stools have been dark for 3 days, having one BM per day.  He denies any problems with heartburn or indigestion, is maintained on Protonix 40 mg daily at home.  Denies any abdominal pain or cramping, and has not noticed any changes in his bowels other than color. 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 Havery Moros ) He had colonoscopy in April 2018 per Dr. Cristina Gong with finding of multiple diverticuli and otherwise negative  exam.  Small bowel enteroscopy in April 2018 was negative.  Iron studies show a ferritin of 60, serum iron of 45 TIBC 291 and iron saturation of 15.  Coumadin has been placed on hold, INR today 2.3 He received 1 unit of packed RBCs this a.m.      Past Medical History:  Diagnosis Date  . Arthritis   . Automatic implantable cardioverter-defibrillator in situ    a. s/p Medtronic Evera device serial [id number]    . Bradycardia   . CAD (coronary artery disease)    a. s/p MI 1982;  s/p DES to CFX 2011;  s/p NSTEMI 4/12 in setting of VTach;  cath 7/12:  LM ok, LAD mid 30-40%, Dx's with 40%, mCFX stent patent, prox to mid RCA occluded with R to R and L to R collats.  Medical Tx was continued  . Chronic systolic heart failure ([place])    a. s/p LVAD 10/2013 for destination therapy  . CKD (chronic kidney disease)   . CVD (cerebrovascular disease)    a. s/p L CEA 2008  . Cytopenia   . Dysrhythmia    AFIB  . History of GI bleed    a. on ASA- started on plavix during 10/2015 admission for CVA  . HLD (hyperlipidemia)   . HTN (hypertension)   . Hypothyroidism   . Inappropriate therapy from implantable cardioverter-defibrillator    a. ATP for sinus tach SVT wavelet identified SVT but was no  passive (2014)  . Ischemic cardiomyopathy   . 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 L Ear   . Myocardial infarction ([place])    1992  . PAF (paroxysmal atrial fibrillation) ([place])   . PVD (peripheral vascular disease) ([place])    a. 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 Shortness of breath dyspnea    W/ EXERTION   . Sleep apnea    NO CPAP NOW  HE SENT IT BACK YRS AGO  . Stroke [facility])    a. 10/2015 admission   . Ventricular tachycardia ([place])    a. s/p AICD;   h/o ICD shock;  Amiodarone Rx. S/P ablation in 2012    Past Surgical History:  Procedure Laterality Date  . BACK SURGERY    . CARDIAC CATHETERIZATION      "several" (05/25/2013)  . CARDIAC DEFIBRILLATOR PLACEMENT  2003; 2005; 05/25/2013   2014: Medtronic Evera device serial number [id number]  . CARDIAC ELECTROPHYSIOLOGY STUDY AND ABLATION  2012  . CARDIOVERSION N/A 10/15/2013   Procedure: CARDIOVERSION;  Surgeon: Jolaine Artist, MD;  Location: [place];  Service: Cardiovascular;  Laterality: N/A;  . CARDIOVERSION N/A 11/04/2013   Procedure: CARDIOVERSION;  Surgeon: Larey Dresser, MD;  Location: [place];  Service: Cardiovascular;  Laterality: N/A;  . CARDIOVERSION N/A 06/04/2014   Procedure: CARDIOVERSION;  Surgeon: Jolaine Artist, MD;  Location: [facility] ENDOSCOPY;  Service: Cardiovascular;  Laterality: N/A;  . CARDIOVERSION N/A 03/02/2015   Procedure: CARDIOVERSION;  Surgeon: Jolaine Artist, MD;  Location: [facility] ENDOSCOPY;  Service: Cardiovascular;  Laterality: N/A;  . CARDIOVERSION N/A 01/19/2016   Procedure: CARDIOVERSION;  Surgeon: Larey Dresser, MD;  Location: [place];  Service: Cardiovascular;  Laterality: N/A;  . CARDIOVERSION N/A 06/10/2018   Procedure: CARDIOVERSION;  Surgeon: Jolaine Artist, MD;  Location: [facility] ENDOSCOPY;  Service: Cardiovascular;  Laterality: N/A;  . CARDIOVERSION N/A 07/02/2018   Procedure: CARDIOVERSION;  Surgeon: Jolaine Artist, MD;  Location: [place];  Service: Cardiovascular;  Laterality: N/A;  . CAROTID ENDARTERECTOMY Left ~ 2007  . CATARACT EXTRACTION W/ INTRAOCULAR LENS  IMPLANT, BILATERAL Bilateral 2000  . CHOLECYSTECTOMY  12/15/?2010  . COLONOSCOPY WITH PROPOFOL N/A 12/04/202018   Procedure: COLONOSCOPY WITH PROPOFOL;  Surgeon: Ronald Lobo, MD;  Location: [place];  Service: Endoscopy;  Laterality: N/A;  . CORONARY ANGIOPLASTY  1992  . CORONARY ANGIOPLASTY WITH STENT PLACEMENT     "last one was 11/2012  (05/25/2013)  . CYSTOGRAM N/A 09/19/2018   Procedure: CYSTOGRAM;  Surgeon: Lucas Mallow, MD;  Location: [place];  Service: Urology;  Laterality: N/A;  . CYSTOSCOPY W/ RETROGRADES  Bilateral 05/16/2018   Procedure: CYSTOSCOPY WITH RETROGRADE PYELOGRAM;  Surgeon: Lucas Mallow, MD;  Location: [place];  Service: Urology;  Laterality: Bilateral;  . CYSTOSCOPY WITH RETROGRADE PYELOGRAM, URETEROSCOPY AND STENT PLACEMENT Left 08/09/2014   Procedure: CYSTOSCOPY WITH RETROGRADE PYELOGRAM, URETEROSCOPY AND STENT PLACEMENT;  Surgeon: Bernestine Amass, MD;  Location: [facility];  Service: Urology;  Laterality: Left;  . CYSTOSCOPY WITH RETROGRADE PYELOGRAM, URETEROSCOPY AND STENT PLACEMENT Left 09/01/2014   Procedure: CYSTOSCOPY WITH URETEROSCOPY AND STENT REMOVAL;  Surgeon: Bernestine Amass, MD;  Location: [facility];  Service: Urology;  Laterality: Left;  . CYSTOSCOPY WITH RETROGRADE PYELOGRAM, URETEROSCOPY AND STENT PLACEMENT Left 09/20/2014   Procedure: CYSTOSCOPY WITH RETROGRADE PYELOGRAM, URETEROSCOPY AND STENT PLACEMENT, stone removal;  Surgeon: Bernestine Amass,  MD;  Location: [facility];  Service: Urology;  Laterality: Left;  . CYSTOSCOPY WITH RETROGRADE PYELOGRAM, URETEROSCOPY AND STENT PLACEMENT Bilateral 12/04/2017   Procedure: CYSTOSCOPY WITH BILATERAL RETROGRADE PYELOGRAM, LEFT URETEROSCOPY AND STENT PLACEMENT;  Surgeon: Lucas Mallow, MD;  Location: [place];  Service: Urology;  Laterality: Bilateral;  . CYSTOSCOPY WITH RETROGRADE PYELOGRAM, URETEROSCOPY AND STENT PLACEMENT Left 09/19/2018   Procedure: left  RETROGRADE PYELOGRAM;  Surgeon: Lucas Mallow, MD;  Location: [place];  Service: Urology;  Laterality: Left;  . DOPPLER ECHOCARDIOGRAPHY  2011  . ELBOW ARTHROSCOPY Right 1990's  . ELECTROPHYSIOLOGIC STUDY N/A 07/19/2016   Procedure: Cardioversion;  Surgeon: Evans Lance, MD;  Location: [place] CV LAB;  Service: Cardiovascular;  Laterality: N/A;  . ENDARTERECTOMY Right 01/13/2016   Procedure: RIGHT CAROTID ARTERY ENDARTERECTOMY;  Surgeon: Mal Misty, MD;  Location: [place];  Service: Vascular;  Laterality: Right;  . ENTEROSCOPY Left 02/23/2017   Procedure: ENTEROSCOPY;   Surgeon: Ronald Lobo, MD;  Location: [facility] ENDOSCOPY;  Service: Endoscopy;  Laterality: Left;  . ENTEROSCOPY N/A 10/17/2017   Procedure: ENTEROSCOPY;  Surgeon: Yetta Flock, MD;  Location: [facility] ENDOSCOPY;  Service: Gastroenterology;  Laterality: N/A;  LVAD pt.    . ESOPHAGOGASTRODUODENOSCOPY N/A 02/15/2015   Procedure: ESOPHAGOGASTRODUODENOSCOPY (EGD);  Surgeon: Carol Ada, MD;  Location: [facility] ENDOSCOPY;  Service: Endoscopy;  Laterality: N/A;  LVAD  . EYE SURGERY     VITRECTOMY  LEFT 05/2012  . FEMORAL-POPLITEAL BYPASS GRAFT Right 2011  . FEMORAL-POPLITEAL BYPASS GRAFT Left 05/2002   Archie Endo 05/06/2002 (05/25/2013)  . HEEL SPUR SURGERY Right 1990's  . HOLMIUM LASER APPLICATION Left 07/37/1062   Procedure: HOLMIUM LASER APPLICATION;  Surgeon: Bernestine Amass, MD;  [facility];  Service: Urology;  Laterality: Left;  . HOLMIUM LASER APPLICATION Left 6/94/8546   Procedure: HOLMIUM LASER APPLICATION;  Surgeon: Lucas Mallow, MD;  Location: [place];  Service: Urology;  Laterality: Left;  . IMPLANTABLE CARDIOVERTER DEFIBRILLATOR GENERATOR CHANGE N/A 05/25/2013   Procedure: IMPLANTABLE CARDIOVERTER DEFIBRILLATOR GENERATOR CHANGE;  Surgeon: Deboraha Sprang, MD;  Location: [facility] CATH LAB;  Service: Cardiovascular;  Laterality: N/A;  . INSERTION OF IMPLANTABLE LEFT VENTRICULAR ASSIST DEVICE N/A 10/19/2013   Procedure: INSERTION OF IMPLANTABLE LEFT VENTRICULAR ASSIST DEVICE;  Surgeon: Gaye Pollack, MD;  Location: [place];  Service: Open Heart Surgery;  Laterality: N/A;  CIRC ARREST  NITRIC OXIDE  MEDTRONIC ICD  . INTRAOPERATIVE TRANSESOPHAGEAL ECHOCARDIOGRAM N/A 10/19/2013   Procedure: INTRAOPERATIVE TRANSESOPHAGEAL ECHOCARDIOGRAM;  Surgeon: Gaye Pollack, MD;  Location: [facility] OR;  Service: Open Heart Surgery;  Laterality: N/A;  . IR FLUORO GUIDE CV LINE RIGHT  12/10/2017  . IR US GUIDE VASC ACCESS RIGHT  12/10/2017  . KNEE ARTHROSCOPY Bilateral 1990's  . LEAD REVISION N/A 06/05/2013   Procedure: LEAD  REVISION;  Surgeon: Thompson Grayer, MD;  Location: [facility] CATH LAB;  Service: Cardiovascular;  Laterality: N/A;  . LEFT HEART CATHETERIZATION WITH CORONARY ANGIOGRAM N/A 11/24/2012   Procedure: LEFT HEART CATHETERIZATION WITH CORONARY ANGIOGRAM;  Surgeon: Peter M Martinique, MD;  Location: [facility] CATH LAB;  Service: Cardiovascular;  Laterality: N/A;  . LIPOMA EXCISION Left 2013   "near ear" (05/25/2013)  . [place]; 2000's  . PATCH ANGIOPLASTY Right 01/13/2016   Procedure: WITH  PATCH ANGIOPLASTY;  Surgeon: Mal Misty, MD;  Location: [place];  Service: Vascular;  Laterality: Right;  . RENAL ARTERY BYPASS Bilateral 2003  . REVASCULARIZATION / IN-SITU GRAFT  LEG    . RIGHT HEART CATH N/A 11/29/2017   Procedure: RIGHT HEART CATH;  Surgeon: Jolaine Artist, MD;  Location: [place] CV LAB;  Service: Cardiovascular;  Laterality: N/A;  . RIGHT HEART CATHETERIZATION N/A 09/17/2013   Procedure: RIGHT HEART CATH;  Surgeon: Jolaine Artist, MD;  Location: [facility] CATH LAB;  Service: Cardiovascular;  Laterality: N/A;  . RIGHT HEART CATHETERIZATION N/A 10/14/2013   Procedure: RIGHT HEART CATH;  Surgeon: Jolaine Artist, MD;  Location: [facility] CATH LAB;  Service: Cardiovascular;  Laterality: N/A;  . RIGHT HEART CATHETERIZATION N/A 11/16/2013   Procedure: RIGHT HEART CATH;  Surgeon: Jolaine Artist, MD;  Location: [facility] CATH LAB;  Service: Cardiovascular;  Laterality: N/A;  . RIGHT HEART CATHETERIZATION N/A 07/13/2014   Procedure: RIGHT HEART CATH;  Surgeon: Jolaine Artist, MD;  Location: [facility] CATH LAB;  Service: Cardiovascular;  Laterality: N/A;  . TEE WITHOUT CARDIOVERSION N/A 11/04/2013   Procedure: TRANSESOPHAGEAL ECHOCARDIOGRAM (TEE);  Surgeon: Larey Dresser, MD;  Location: [place];  Service: Cardiovascular;  Laterality: N/A;  . TEE WITHOUT CARDIOVERSION N/A 03/02/2015   Procedure: TRANSESOPHAGEAL ECHOCARDIOGRAM (TEE);  Surgeon: Jolaine Artist, MD;  Location: [facility] ENDOSCOPY;  Service:  Cardiovascular;  Laterality: N/A;  . TEE WITHOUT CARDIOVERSION N/A 01/19/2016   Procedure: TRANSESOPHAGEAL ECHOCARDIOGRAM (TEE);  Surgeon: Larey Dresser, MD;  Location: [place];  Service: Cardiovascular;  Laterality: N/A;  . TONSILLECTOMY  1946  . TRANSURETHRAL RESECTION OF BLADDER TUMOR N/A 12/04/2017   Procedure: TRANSURETHRAL RESECTION OF BLADDER TUMOR (TURBT);  Surgeon: Lucas Mallow, MD;  Location: [place];  Service: Urology;  Laterality: N/A;  . TRANSURETHRAL RESECTION OF BLADDER TUMOR N/A 05/16/2018   Procedure: TRANSURETHRAL RESECTION OF BLADDER TUMOR (TURBT);  Surgeon: Lucas Mallow, MD;  Location: [place];  Service: Urology;  Laterality: N/A;  . TRANSURETHRAL RESECTION OF BLADDER TUMOR WITH MITOMYCIN-C N/A 09/19/2018   Procedure: TRANSURETHRAL RESECTION OF BLADDER TUMOR;  Surgeon: Lucas Mallow, MD;  Location: [place];  Service: Urology;  Laterality: N/A;  . TUMOR EXCISION Left 1960's   "fatty tumor" (05/25/2013)  . V-TACH ABLATION N/A 09/12/2011   Procedure: V-TACH ABLATION;  Surgeon: Evans Lance, MD;  Location: [facility] CATH LAB;  Service: Cardiovascular;  Laterality: N/A;  . V-TACH ABLATION N/A 06/08/2013   Procedure: V-TACH ABLATION;  Surgeon: Evans Lance, MD;  Location: [facility] CATH LAB;  Service: Cardiovascular;  Laterality: N/A;    Prior to Admission medications   Medication Sig Start Date End Date Taking? Authorizing Provider  amiodarone (PACERONE) 200 MG tablet Take 1 tablet (200 mg total) by mouth 2 (two) times daily. 08/29/18  Yes Bensimhon, Shaune Pascal, MD  atorvastatin (LIPITOR) 80 MG tablet Take 1 tablet (80 mg total) by mouth at bedtime. 08/15/18  Yes Bensimhon, Shaune Pascal, MD  docusate sodium (COLACE) 100 MG capsule Take 100 mg by mouth daily.    Yes [provider]  furosemide (LASIX) 20 MG tablet Take 20 mg by mouth daily as needed for fluid.   Yes [provider]  levothyroxine (SYNTHROID, LEVOTHROID) 150 MCG tablet Take 1 tablet (150 mcg total)  by mouth daily before breakfast. 10/25/17  Yes Bensimhon, Shaune Pascal, MD  Multiple Vitamin (MULTIVITAMIN WITH MINERALS) TABS tablet Take 1 tablet by mouth daily.   Yes [provider]  Multiple Vitamins-Minerals (ICAPS PO) Take 1 tablet by mouth 2 (two) times daily.   Yes [provider]  pantoprazole (PROTONIX) 40 MG tablet  TAKE 1 TABLET TWICE DAILY Patient taking differently: Take 40 mg by mouth 2 (two) times daily before a meal.  05/05/18  Yes Bensimhon, Shaune Pascal, MD  promethazine (PHENERGAN) 12.5 MG tablet Take 12.5 mg by mouth every 6 (six) hours as needed for nausea or vomiting.   Yes [provider]  protein supplement shake (PREMIER PROTEIN) LIQD Take 11 oz by mouth 2 (two) times daily.   Yes [provider]  sotalol (BETAPACE) 80 MG tablet Take 1 tablet (80 mg total) by mouth 2 (two) times daily. 07/14/18  Yes Georgiana Shore, NP  temazepam (RESTORIL) 30 MG capsule Take 1 capsule (30 mg total) by mouth at bedtime as needed for sleep. 06/30/18  Yes Bensimhon, Shaune Pascal, MD  warfarin (COUMADIN) 2 MG tablet Take 1-2 mg by mouth every evening. Take 1mg  on Tuesdays and 2mg  on all other days.   Yes [provider]  zinc gluconate 50 MG tablet Take 50 mg by mouth 2 (two) times daily.     [provider]    Current Facility-Administered Medications  Medication Dose Route Frequency Provider Last Rate Last Dose  . 0.9 %  sodium chloride infusion   Intravenous PRN Bensimhon, Shaune Pascal, MD   Stopped at 10/23/18 1000  . acetaminophen (TYLENOL) tablet 650 mg  650 mg Oral Q4H PRN Shirley Friar, PA-C      . amiodarone (PACERONE) tablet 200 mg  200 mg Oral BID Shirley Friar, PA-C   200 mg at 10/23/18 1013  . atorvastatin (LIPITOR) tablet 80 mg  80 mg Oral QHS Shirley Friar, PA-C   80 mg at 10/22/18 2108  . docusate sodium (COLACE) capsule 100 mg  100 mg Oral Daily Shirley Friar, PA-C      . feeding supplement (BOOST /  RESOURCE BREEZE) liquid 1 Container  1 Container Oral BID BM Shirley Friar, PA-C   1 Container at 10/23/18 1013  . levothyroxine (SYNTHROID, LEVOTHROID) tablet 150 mcg  150 mcg Oral QAC breakfast Shirley Friar, PA-C   150 mcg at 10/23/18 6269  . multivitamin with minerals tablet 1 tablet  1 tablet Oral Daily Shirley Friar, PA-C   1 tablet at 10/23/18 1013  . ondansetron (ZOFRAN) injection 4 mg  4 mg Intravenous Q6H PRN Shirley Friar, PA-C      . pantoprazole (PROTONIX) EC tablet 40 mg  40 mg Oral BID Shirley Friar, PA-C   40 mg at 10/23/18 1013  . promethazine (PHENERGAN) tablet 12.5 mg  12.5 mg Oral Q6H PRN Shirley Friar, PA-C      . sotalol (BETAPACE) tablet 80 mg  80 mg Oral BID Shirley Friar, PA-C   80 mg at 10/23/18 1013  . temazepam (RESTORIL) capsule 30 mg  30 mg Oral QHS PRN Shirley Friar, PA-C   30 mg at 10/22/18 2110  . Warfarin - Pharmacist Dosing Inpatient   Does not apply q1800 Einar Grad, [facility]   Stopped at 10/22/18 1800  . zinc sulfate capsule 220 mg  220 mg Oral Daily Einar Grad, RPH   220 mg at 10/23/18 1013    Allergies as of 10/22/2018 - Review Complete 10/22/2018  Allergen Reaction Noted  . Demerol [meperidine] Other (See Comments) 08/27/2011    Family History  Problem Relation Age of Onset  . Heart attack Mother   . Coronary artery disease Mother   . Cancer Mother   . Heart disease Mother   .  Hyperlipidemia Mother   . Hypertension Mother   . Coronary artery disease Father   . Stroke Father   . Cancer Father   . Heart disease Father        before age 39  . Hyperlipidemia Father   . Hypertension Father   . Heart attack Father   . Coronary artery disease Other     Social History   Socioeconomic History  . Marital status: Divorced    Spouse name: Not on file  . Number of children: 2  . Years of education: Not on file  . Highest education level: Not on file    Occupational History  . Occupation: retired    Comment: Health visitor  . Occupation: Retired    Comment: Security [employer]  . Occupation: Part-time    Comment: Security at [employer]  . Financial resource strain: Not on file  . Food insecurity:    Worry: Not on file    Inability: Not on file  . Transportation needs:    Medical: No    Non-medical: No  Tobacco Use  . Smoking status: Former Smoker    Packs/day: 0.50    Years: 37.00    Pack years: 18.50    Types: Cigarettes    Last attempt to quit: 11/05/2001    Years since quitting: 16.9  . Smokeless tobacco: Never Used  Substance and Sexual Activity  . Alcohol use: Yes    Alcohol/week: 0.0 standard drinks    Comment: 05/25/2013 "mixed drink couple times/month"  . Drug use: No  . Sexual activity: Never  Lifestyle  . Physical activity:    Days per week: Not on file    Minutes per session: Not on file  . Stress: Not on file  Relationships  . Social connections:    Talks on phone: Not on file    Gets together: Not on file    Attends religious service: Not on file    Active member of club or organization: Not on file    Attends meetings of clubs or organizations: Not on file    Relationship status: Not on file  . Intimate partner violence:    Fear of current or ex partner: Not on file    Emotionally abused: Not on file    Physically abused: Not on file    Forced sexual activity: Not on file  Other Topics Concern  . Not on file  Social History Narrative   Lives with sig other.    Review of Systems: Pertinent positive and negative review of systems were noted in the above HPI section.  All other review of systems was otherwise negative.  Physical Exam: Vital signs in last 24 hours: Temp:  [97.4 F (36.3 C)-98.2 F (36.8 C)] 97.5 F (36.4 C) (12/19 0920) Pulse Rate:  [29-83] 74 (12/19 1045) Resp:  [11-24] 16 (12/19 1045) BP: (71-109)/(37-87) 85/69 (12/19 1030) SpO2:  [93 %-100 %] 98 % (12/19  1045) Weight:  [77.1 kg] 77.1 kg (12/19 0600) Last BM Date: 10/21/18 General:   Alert,  Well-developed, well-nourished, elderly white male pleasant and cooperative in NAD Head:  Normocephalic and atraumatic. Eyes:  Sclera clear, no icterus.   Conjunctiva pale. Ears:  Normal auditory acuity. Nose:  No deformity, discharge,  or lesions. Mouth:  No deformity or lesions.   Neck:  Supple; no masses or thyromegaly. Lungs:  Clear throughout to auscultation.   No wheezes, crackles, or rhonchi. Heart: LVAD sounds, AICD in left  chest Abdomen:  Soft,nontender, BS active,nonpalp mass or hsm.   Rectal:  Deferred -we will send for Hemoccult and pending Msk:  Symmetrical without gross deformities. . Pulses:  Normal pulses noted. Extremities:  Without clubbing or edema. Neurologic:  Alert and  oriented x4;  grossly normal neurologically. Skin:  Intact without significant lesions or rashes.. Psych:  Alert and cooperative. Normal mood and affect.  Intake/Output from previous day: 12/18 0701 - 12/19 0700 In: 1750 [P.O.:600; I.V.:150; IV Piggyback:1000] Out: 1925 [Urine:1925] Intake/Output this shift: Total I/O In: 140 [P.O.:120; I.V.:20] Out: -   Lab Results: Recent Labs    10/20/18 1422 10/22/18 1142 10/23/18 0238  WBC 5.5 6.2 4.2  HGB 9.5* 8.6* 7.9*  HCT 31.6* 27.7* 25.2*  PLT 164 140* 129*   BMET Recent Labs    10/20/18 1422 10/22/18 1142 10/23/18 0238  NA 133* 135 137  K 4.7 4.6 4.8  CL 100 104 105  CO2 24 23 22   GLUCOSE 101* 109* 89  BUN 42* 42* 38*  CREATININE 1.95* 1.85* 1.80*  CALCIUM 8.6* 8.6* 8.7*   LFT Recent Labs    10/22/18 1142  PROT 6.4*  ALBUMIN 3.2*  AST 35  ALT 30  ALKPHOS 143*  BILITOT 0.7   PT/INR Recent Labs    10/22/18 1142 10/23/18 0238  LABPROT 27.0* 25.5*  INR 2.54 2.37   Hepatitis Panel No results for input(s): HEPBSAG, HCVAB, HEPAIGM, HEPBIGM in the last 72 hours.   IMPRESSION:   #72 77 year old white male with recurrent low-grade  GI bleed in setting of chronic Coumadin use and an LVAD patient who has had previously documented jejunal AVMs which were treated with clipping in December 2018. Patient presents now with progressive weakness and black stool for 3 days. Suspect recurrent bleeding from small bowel AVMs.  Prior EGD and colonoscopies have not shown any AVMs though certainly he may have developed since last procedures.  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  Patient will require radiation therapy #8 history of recurrent V. Tach #9 diverticulosis  Plan;; continue Protonix 40 mg p.o. daily Coumadin has been placed on hold, will allow INR to drift a bit further Pete hemoglobin this afternoon, transfuse for hemoglobin less than 8 Patient has been scheduled for upper endoscopy and small bowel enteroscopy with Dr. Rush Landmark tentatively set for tomorrow Procedure was discussed in detail with the patient including indications risks and benefits and he is agreeable to proceed. INR will need to be 2.0 or less Procedure can be delayed a day if necessary.  We will reevaluate in a.m. to determine appropriate to proceed with procedures tomorrow. Thank you we will follow with you   Branae Crail  10/23/2018, 12:22 PM

## 2018-10-23 NOTE — Progress Notes (Signed)
LVAD Coordinator Rounding Note:  Admitted 10/22/18 from home with continuous low flow alarms since 0630 this morning. Patient reports feeling weak. Also reports having black stools for 2 days. Lab work pending.    HM II Implanted 10/19/13 by Dr. Cyndia Bent under Destination Therapy criteria.  Pt lying in bed this am, reports PI event when he stood to weigh this am. Continues to have black stool, scheduled for one unit of PCs today.   Vital signs: Temp:  98.2 HR: 75 regular Doppler Pressure:not done Automatic cuff:  109/87 (95) O2 Sat: 98%on RA  Weight:  169.9 lbs  LVAD interrogation reveals:  Speed: 8400  Flow:3.2 Power: 3.7w PI: 6.2  Alarms:  100 low flow alarms 10/22/18 Events:1 PI event this am  Fixed speed: 8400 Low speed limit: 8000  Drive Line: Driveline dressing C/D/I. Weekly dressings performed by bedside nurse or SO [name]. Next dressing change due 10/29/18.   Labs:  LDH trend: 195>178  INR trend: 2.54>2.37  Hgb trend:  8.6>7.9  Blood products this admission: - 10/23/18 > 1 unit PRBCs   Anticoagulation Plan: - INR Goal: 2.0 - 2.5 (coumadin on hold due to current GI bleed) - ASA Dose: no ASA (hx of GI bleed)   Device: -Dual Medtronic ICD - VF therapies: on at 200 bpm - AT/AF monitor on: 140 bpm  - Last device check 09/30/18 per Tomi Bamberger; underlying rhythm Wenckeback  Significant Events on VAD Support:  09/01/14>>ureteral stone with cystoscopy and flexible ureteroscopy with lithotripsy and basket extraction 02/2015 >>GIB with 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 Started on Plavix 3/17>>Lumbar fusion L3-L5 9/17>>VT with DC-CV; amio increased 10/17>>ramp with RV failure; speed decreased to 8800 02/2017> GIB (removed ASA, scopes negative) 03/2017>percutaneous lead repair after pump stops   10/18>>Slow VT. Cardioverted in ED and amiodarone uptitrated 12/18>>melena with scope. Hgb 6.5. EGD with 2 AVMs with APC 12/18>>recurrent hematuria. 6 mm left ureteral stone 1/19>>7 mm left ureteral stone removal with laser lithotripsy; left ureteral stent. Resection of 6 cm bladder tumor 11/19>>Bladder tumor resections. Gross hematuria requiring CBI and radiation treatments.   Plan/Recommendations:  1. Please call VAD pager if any issues with VAD equipment or questions, concerns. 2. Daily dressing changes, next dressing change due 10/29/18. Bedside nurse may change dressing.    Zada Girt RN [place] Coordinator  Office: [phone]  24/7 Pager: [phone]

## 2018-10-23 NOTE — Plan of Care (Signed)
  Problem: Pain Managment: Goal: General experience of comfort will improve Outcome: Progressing   Problem: Safety: Goal: Ability to remain free from injury will improve Outcome: Progressing   Problem: Skin Integrity: Goal: Risk for impaired skin integrity will decrease Outcome: Progressing   Problem: Cardiac: Goal: LVAD will function as expected and patient will experience no clinical alarms Outcome: Progressing   Problem: Education: Goal: Patient will understand all VAD equipment and how it functions Outcome: Completed/Met Goal: Patient will be able to verbalize current INR target range and antiplatelet therapy for discharge home Outcome: Completed/Met

## 2018-10-23 NOTE — Progress Notes (Addendum)
Advanced Heart Failure VAD Team Note  PCP-Cardiologist: No primary care provider on file.   Subjective:    Cr stable. Hgb 9.5 -> 8.4 -> 7.9 with black stools.   Feeling much better with IVF. No further low flows. Denies SOB. No lightheadedness or dizziness now with IVF.   LVAD Interrogation HM 2: Speed: 8400 Flow: 2.9 PI: 5.9 Power: 3.7. 1 PI events. No further low flows with IVF.   Objective:    Vital Signs:   Temp:  [98 F (36.7 C)-98.2 F (36.8 C)] 98.2 F (36.8 C) (12/19 0400) Pulse Rate:  [29-77] 74 (12/19 0700) Resp:  [11-24] 13 (12/19 0700) BP: (71-105)/(41-90) 87/69 (12/19 0700) SpO2:  [93 %-100 %] 95 % (12/19 0700) Weight:  [77.1 kg] 77.1 kg (12/19 0600) Last BM Date: 10/21/18 Mean arterial Pressure 60-70s  Intake/Output:   Intake/Output Summary (Last 24 hours) at 10/23/2018 0734 Last data filed at 10/23/2018 0700 Gross per 24 hour  Intake 1750 ml  Output 1925 ml  Net -175 ml     Physical Exam    General:  NAD  HEENT: normal Neck: supple. JVP 6-7. Carotids 2+ bilat; no bruits. No lymphadenopathy or thyromegaly appreciated. Cor: Mechanical heart sounds with LVAD hum present. Lungs: clear Abdomen: soft, nontender, nondistended. No hepatosplenomegaly. No bruits or masses. Good bowel sounds. Driveline: C/D/I; securement device intact and driveline incorporated Extremities: no cyanosis, clubbing, rash, or edema. Neuro: alert & orientedx3, cranial nerves grossly intact. moves all 4 extremities w/o difficulty. Affect pleasant   Telemetry   NSR 70s, personally reviewed.   EKG    No new tracings.    Labs   Basic Metabolic Panel: Recent Labs  Lab 10/20/18 1422 10/22/18 1142 10/23/18 0238  NA 133* 135 137  K 4.7 4.6 4.8  CL 100 104 105  CO2 24 23 22   GLUCOSE 101* 109* 89  BUN 42* 42* 38*  CREATININE 1.95* 1.85* 1.80*  CALCIUM 8.6* 8.6* 8.7*  MG 2.2 2.2  --     Liver Function Tests: Recent Labs  Lab 10/20/18 1422 10/22/18 1142  AST 38  35  ALT 33 30  ALKPHOS 153* 143*  BILITOT 0.4 0.7  PROT 7.1 6.4*  ALBUMIN 3.5 3.2*   No results for input(s): LIPASE, AMYLASE in the last 168 hours. No results for input(s): AMMONIA in the last 168 hours.  CBC: Recent Labs  Lab 10/20/18 1422 10/22/18 1142 10/23/18 0238  WBC 5.5 6.2 4.2  NEUTROABS  --  4.1  --   HGB 9.5* 8.6* 7.9*  HCT 31.6* 27.7* 25.2*  MCV 101.6* 99.3 99.2  PLT 164 140* 129*    INR: Recent Labs  Lab 10/20/18 1422 10/22/18 1142 10/23/18 0238  INR 2.47 2.54 2.37    Other results:   Imaging    No results found.   Medications:     Scheduled Medications: . amiodarone  200 mg Oral BID  . atorvastatin  80 mg Oral QHS  . docusate sodium  100 mg Oral Daily  . feeding supplement (PRO-STAT SUGAR FREE 64)  30 mL Oral BID  . levothyroxine  150 mcg Oral QAC breakfast  . multivitamin with minerals  1 tablet Oral Daily  . pantoprazole  40 mg Oral BID  . sotalol  80 mg Oral BID  . Warfarin - Pharmacist Dosing Inpatient   Does not apply q1800  . zinc sulfate  220 mg Oral Daily     Infusions: . sodium chloride 500 mL (10/22/18 1253)  PRN Medications:  sodium chloride, acetaminophen, ondansetron (ZOFRAN) IV, promethazine, temazepam   Patient Profile   [name] is a 77 y.o. male with h/o VT, PAD and severe systolic HF (EF 55-73%) due to mixed ischemic/nonischemic CM he is s/p HM II LVAD implant for destination therapy on October 19, 2013.VAD implantation was complicated by VT, syncope, AF/Aflutter and RHF. Required short term milrinone. Has had very complicated course recently with recurrent bladder tumors.   Direct admitted from home with frequent low flows and malaise.   Assessment/Plan:    1. Low Flow Alarms on LVAD.  - VAD interrogated personally. No further low flow events with IVF.  - Now clear that this is likely in the setting of anemia.  - NSR by tele. Device interrogation pending.  - VAD speed turned down to 8400  10/20/18 with no improvement.  - INR 2.54 -> 2.37. Held last night with down trend in Hgb.  - LDH stable.   - Afebrile. Labs and UA clean with only small leukocytes and rare bacteria.  2. Chronic systolic HF: s/p HM-II LVAD 10/2014. s/p previous driveline [id number]. No further pump stops. Enhaut in 1/19 with mildly low output. Started on milrinone without benefitso it was stopped.  - No further low flow alarms with IVF.  - Hgb trending down. Will ask GI to see.  - VAD speed turned down to 8400 3. Recurrenthigh-gradebladder tumor: -S/p stone removal. 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 Had 2 BCG infusions with Dr. Gloriann Loan but discontinued due to inability to tolerate.-S/p repeat resection 7/19.  -s/p repeat bladder tumor excision in 11/19 with fast-growing tumor. C/b bilateral hydronephrosis and persistent hematuria requiring XRT. - Now planning palliative XRT with Dr. Tammi Klippel. No further hematuria - No change to current plan.   4. CKD, stage III: Had AKI in setting of obstructive uropathy and ATN. Required HD x1(11/2017).  - Cr stable at 1.80 - Not interested in nephrostomy tubes for hydronephrosis 5.RecurrentVTwith hypotension and low flow alarms on VAD: Underwent DC-CV 9/17, 08/17/17, and 04/01/18. S/p emergent DC-CV in ER 07/09/18  -EKG pending. Appears to be in NSR by tele.  - Continue amio. May need to stop sotalol soon or decrease dose if creatinine worsens. - Keep K > 4.0 Mg > 2.0 6.GIB: s/p clipping of 2 jejunal AVMs in 4/16. Continue coumadin and plavix.(with h/o CVA). Admitted 4/18 with recurrent GIB. Transfused 2u. EGD/colon negative. Admitted 12./18. hgb 6.5, Transfused 5u. EGD with 2 AVMs treated with APC.  - Down to 7.9 from 9.5 3 day ago with black stools. Will give 1 uPRBCs. Will ask GI to see.  - Hadbeen getting octreotide infusions but too expensive for him. Willcontinue to hold for now 7. Atrial arrhythmias: Atrial fibrillation,  atrial tachycardia. s/p multiple DC-CVs last 07/02/18. 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  - Remains in NSR.  - Continueamiodaroneand sotalol.  8. HTN: - MAPs improved with fluid.  9. Anticoagulation management:  - Goal INR 2-2.5. 2.3. Dosing per pharm-D. Letting trend down.  10. Lumbar compression fracture: S/p L3-L5 fusion 02/03/16. - Stable.  11. 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 S/p R CEA 01/13/16. Plavix stopped 12/18 due to recurrent GIB. - Continue atorvastatin (andwarfarin INR goal 2-2.5). - No change to current plan.   12. H/o amio-induced hypethyroidism: Followed by Dr. Forde Dandy in past.  - Amio restarted due to recurrent atrial arrhythmias and VT. - Follow.  I reviewed the LVAD parameters from today, and compared the results to  the patient's prior recorded data.  No programming changes were made.  The LVAD is functioning within specified parameters.  The patient performs LVAD self-test daily.  LVAD interrogation was negative for any significant power changes, alarms or PI events/speed drops.  LVAD equipment check completed and is in good working order.  Back-up equipment present.   LVAD education done on emergency procedures and precautions and reviewed exit site care.  Length of Stay: 1  Annamaria Helling 10/23/2018, 7:34 AM  VAD Team --- VAD ISSUES ONLY--- Pager [phone] (7am - 7am)  Advanced Heart Failure Team  Pager [phone] (M-F; 7a - 4p)  Please contact [facility] for night-coverage after hours (4p -7a ) and weekends on [email or web address]  Patient seen and examined with the above-signed Advanced Practice Provider and/or Housestaff. I personally reviewed laboratory data, imaging studies and relevant notes. I independently examined the patient and formulated the important aspects of the plan. I have edited the note to reflect any of my changes or salient points. I have personally discussed the plan with the patient and/or family.  Feeling better with IVF.  Low flows on VAD resolved. Now having melena with dropping Hgb. INR 2.3. Will hold coumadin. Discussed dosing with PharmD personally. Consult GI. Transfuse 1u RBCs.   Glori Bickers, MD  9:07 AM

## 2018-10-24 ENCOUNTER — Ambulatory Visit: Payer: Medicare Other

## 2018-10-24 ENCOUNTER — Encounter (HOSPITAL_COMMUNITY)
Admission: AD | Disposition: A | Discharge status: Home / Self Care | Payer: Self-pay | Source: Home / Self Care | Attending: Internal Medicine

## 2018-10-24 ENCOUNTER — Inpatient Hospital Stay (HOSPITAL_COMMUNITY): Payer: Medicare Other | Admitting: Certified Registered"

## 2018-10-24 ENCOUNTER — Encounter (HOSPITAL_COMMUNITY): Payer: Self-pay

## 2018-10-24 DIAGNOSIS — K31819 Angiodysplasia of stomach and duodenum without bleeding: Secondary | ICD-10-CM

## 2018-10-24 DIAGNOSIS — K921 Melena: Secondary | ICD-10-CM

## 2018-10-24 HISTORY — PX: HOT HEMOSTASIS: SHX5433

## 2018-10-24 HISTORY — PX: ENTEROSCOPY: SHX5533

## 2018-10-24 LAB — TYPE AND SCREEN
ABO/RH(D): O POS
Antibody Screen: NEGATIVE
Unit division: 0

## 2018-10-24 LAB — BASIC METABOLIC PANEL
Anion gap: 8 (ref 5–15)
BUN: 36 mg/dL — AB (ref 8–23)
CO2: 22 mmol/L (ref 22–32)
Calcium: 8.8 mg/dL — ABNORMAL LOW (ref 8.9–10.3)
Chloride: 107 mmol/L (ref 98–111)
Creatinine, Ser: 1.9 mg/dL — ABNORMAL HIGH (ref 0.61–1.24)
GFR calc Af Amer: 38 mL/min — ABNORMAL LOW (ref >60)
GFR calc non Af Amer: 33 mL/min — ABNORMAL LOW (ref >60)
GLUCOSE: 99 mg/dL (ref 70–99)
Potassium: 4.8 mmol/L (ref 3.5–5.1)
Sodium: 137 mmol/L (ref 135–145)

## 2018-10-24 LAB — CBC
HCT: 29.1 % — ABNORMAL LOW (ref 39.0–52.0)
Hemoglobin: 9.3 g/dL — ABNORMAL LOW (ref 13.0–17.0)
MCH: 31 pg (ref 26.0–34.0)
MCHC: 32 g/dL (ref 30.0–36.0)
MCV: 97 fL (ref 80.0–100.0)
Platelets: 136 10*3/uL — ABNORMAL LOW (ref 150–400)
RBC: 3 MIL/uL — ABNORMAL LOW (ref 4.22–5.81)
RDW: 18.8 % — ABNORMAL HIGH (ref 11.5–15.5)
WBC: 6.2 10*3/uL (ref 4.0–10.5)
nRBC: 0 % (ref 0.0–0.2)

## 2018-10-24 LAB — BPAM RBC
Blood Product Expiration Date: 202001132359
ISSUE DATE / TIME: 201912190849
UNIT TYPE AND RH: 5100

## 2018-10-24 LAB — PROTIME-INR
INR: 1.91
Prothrombin Time: 21.6 seconds — ABNORMAL HIGH (ref 11.4–15.2)

## 2018-10-24 LAB — OCCULT BLOOD X 1 CARD TO LAB, STOOL: Fecal Occult Bld: POSITIVE — AB

## 2018-10-24 LAB — LACTATE DEHYDROGENASE: LDH: 196 U/L — ABNORMAL HIGH (ref 98–192)

## 2018-10-24 SURGERY — ENTEROSCOPY
Anesthesia: Monitor Anesthesia Care

## 2018-10-24 MED ORDER — POLYETHYLENE GLYCOL 3350 17 GM/SCOOP PO POWD
0.5000 | Freq: Once | ORAL | Status: AC
  Administered 2018-10-24: 127.5 g via ORAL
  Filled 2018-10-24: qty 255

## 2018-10-24 MED ORDER — SODIUM CHLORIDE 0.9 % IV SOLN
INTRAVENOUS | Status: DC | PRN
  Administered 2018-10-24: 50 ug/min via INTRAVENOUS

## 2018-10-24 MED ORDER — SODIUM CHLORIDE BACTERIOSTATIC 0.9 % IJ SOLN
INTRAMUSCULAR | Status: DC | PRN
  Administered 2018-10-24: 1 mL via SUBMUCOSAL

## 2018-10-24 MED ORDER — SODIUM CHLORIDE 0.9 % IV SOLN
510.0000 mg | Freq: Once | INTRAVENOUS | Status: AC
  Administered 2018-10-24: 510 mg via INTRAVENOUS
  Filled 2018-10-24: qty 17

## 2018-10-24 MED ORDER — PROPOFOL 500 MG/50ML IV EMUL
INTRAVENOUS | Status: DC | PRN
  Administered 2018-10-24: 75 ug/kg/min via INTRAVENOUS

## 2018-10-24 MED ORDER — PROPOFOL 10 MG/ML IV BOLUS
INTRAVENOUS | Status: DC | PRN
  Administered 2018-10-24: 20 mg via INTRAVENOUS

## 2018-10-24 MED ORDER — LACTATED RINGERS IV SOLN
INTRAVENOUS | Status: DC | PRN
  Administered 2018-10-24: 16:00:00 via INTRAVENOUS

## 2018-10-24 MED ORDER — ALBUMIN HUMAN 5 % IV SOLN
INTRAVENOUS | Status: DC | PRN
  Administered 2018-10-24: 16:00:00 via INTRAVENOUS

## 2018-10-24 MED ORDER — SPOT INK MARKER SYRINGE KIT
PACK | SUBMUCOSAL | Status: DC | PRN
  Administered 2018-10-24: 2 mL via SUBMUCOSAL

## 2018-10-24 MED ORDER — BISACODYL 5 MG PO TBEC
10.0000 mg | DELAYED_RELEASE_TABLET | Freq: Once | ORAL | Status: DC
  Filled 2018-10-24: qty 2

## 2018-10-24 SURGICAL SUPPLY — 14 items

## 2018-10-24 NOTE — Progress Notes (Signed)
ANTICOAGULATION CONSULT NOTE - [place] for warfarin Indication: LVAD  Allergies  Allergen Reactions  . Demerol [Meperidine] Other (See Comments)    Paralysis/ Could only move eyes    Patient Measurements: Height: 6' (182.9 cm) Weight: 171 lb 8.3 oz (77.8 kg) IBW/kg (Calculated) : 77.6  Vital Signs: Temp: 98.4 F (36.9 C) (12/20 0809) Temp Source: Oral (12/20 0809) BP: 90/78 (12/20 1200) Pulse Rate: 73 (12/20 0737)  Labs: Recent Labs    10/22/18 1142 10/23/18 0238 10/23/18 1353 10/24/18 0537  HGB 8.6* 7.9* 9.0* 9.3*  HCT 27.7* 25.2* 28.9* 29.1*  PLT 140* 129*  --  136*  LABPROT 27.0* 25.5*  --  21.6*  INR 2.54 2.37  --  1.91  CREATININE 1.85* 1.80*  --  1.90*    Estimated Creatinine Clearance: 34.6 mL/min (A) (by C-G formula based on SCr of 1.9 mg/dL (H)).   Medical History: Past Medical History:  Diagnosis Date  . Arthritis   . Automatic implantable cardioverter-defibrillator in situ    a. s/p Medtronic Evera device serial [id number]    . Bradycardia   . CAD (coronary artery disease)    a. s/p MI 1982;  s/p DES to CFX 2011;  s/p NSTEMI 4/12 in setting of VTach;  cath 7/12:  LM ok, LAD mid 30-40%, Dx's with 40%, mCFX stent patent, prox to mid RCA occluded with R to R and L to R collats.  Medical Tx was continued  . Chronic systolic heart failure ([place])    a. s/p LVAD 10/2013 for destination therapy  . CKD (chronic kidney disease)   . CVD (cerebrovascular disease)    a. s/p L CEA 2008  . Cytopenia   . Dysrhythmia    AFIB  . History of GI bleed    a. on ASA- started on plavix during 10/2015 admission for CVA  . HLD (hyperlipidemia)   . HTN (hypertension)   . Hypothyroidism   . Inappropriate therapy from implantable cardioverter-defibrillator    a. ATP for sinus tach SVT wavelet identified SVT but was no  passive (2014)  . Ischemic cardiomyopathy   . 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 L Ear   . Myocardial infarction ([place])    1992   . PAF (paroxysmal atrial fibrillation) ([place])   . PVD (peripheral vascular disease) ([place])    a. 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 Shortness of breath dyspnea    W/ EXERTION   . Sleep apnea    NO CPAP NOW  HE SENT IT BACK YRS AGO  . Stroke [facility])    a. 10/2015 admission   . Ventricular tachycardia ([place])    a. s/p AICD;   h/o ICD shock;  Amiodarone Rx. S/P ablation in 2012     Assessment: 30 yoM with HM II LVAD admitted with low flow alarms. Pt on warfarin with last dose at home yesterday. INR on admit 2.54 down to 1.9 no heaprin at this time - follow up after GI eval today. LDH stable, H/H trending down.  *PTA Dose = 1mg  Tues, 2mg  all other days  Goal of Therapy:  INR 2-2.5 Monitor platelets by anticoagulation protocol: Yes   Plan:  -Holding warfarin for now with H/H trend -Follow protime - follow up after GI eval   Bonnita Nasuti Pharm.D. CPP, BCPS Clinical  Pharmacist [phone] 10/24/2018 12:16 PM

## 2018-10-24 NOTE — Anesthesia Postprocedure Evaluation (Signed)
Anesthesia Post Note  Patient: RAUL TORRANCE  Procedure(s) Performed: ENTEROSCOPY (N/A ) HOT HEMOSTASIS (ARGON PLASMA COAGULATION/BICAP) (N/A ) SUBMUCOSAL TATTOO INJECTION     Patient location during evaluation: PACU Anesthesia Type: MAC Level of consciousness: awake and alert Pain management: pain level controlled Vital Signs Assessment: post-procedure vital signs reviewed and stable Respiratory status: spontaneous breathing, nonlabored ventilation and respiratory function stable Cardiovascular status: stable and blood pressure returned to baseline Anesthetic complications: no    Last Vitals:  Vitals:   10/24/18 1532 10/24/18 1633  BP: 106/87 93/70  Pulse: 76 75  Resp: 16 14  Temp: 36.8 C 36.5 C  SpO2: 93% 98%    Last Pain:  Vitals:   10/24/18 1532  TempSrc: Oral  PainSc: 0-No pain                 Audry Pili

## 2018-10-24 NOTE — Progress Notes (Signed)
LVAD Coordinator Rounding Note:  Admitted 10/22/18 from home with continuous low flow alarms since 0630 this morning. Patient reports feeling weak. Reported having black stools for 2 days.    HM II Implanted 10/19/13 by Dr. Cyndia Bent under Destination Therapy criteria.  Pt lying in bed this am, asleep. Last black stool 10/23/18. Received one unit PC's 10/23/18. Planned EGD later today.  Vital signs: Temp:  98.4 HR: 73 regular Doppler Pressure: 80 Automatic cuff:  92/77 (84) O2 Sat: 100%on RA  Weight:  169.9>171.5  lbs  LVAD interrogation reveals:  Speed: 8400  Flow:3.3 [place]: 3.8w PI: 6.6  Alarms:  none Events:none  Fixed speed: 8400 Low speed limit: 8000  Drive Line: Driveline dressing C/D/I. Weekly dressings performed by bedside nurse or SO [name]. Next dressing change due 10/29/18.   Labs:  LDH trend: 195>178>196  INR trend: 2.54>2.37>1.91  Hgb trend:  8.6>7.9>9.3  Blood products this admission: - 10/23/18 > 1 unit PRBCs   Anticoagulation Plan: - INR Goal: 2.0 - 2.5 (coumadin on hold due to current GI bleed) - ASA Dose: no ASA (hx of GI bleed)   Device: - Dual Medtronic ICD - VF therapies: on at 200 bpm - AT/AF monitor on: 140 bpm  - Last device check 09/30/18 per Tomi Bamberger; underlying rhythm Wenckeback  Significant Events on VAD Support:  09/01/14>>ureteral stone with cystoscopy and flexible ureteroscopy with lithotripsy and basket extraction 02/2015 >>GIB with 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 Started on Plavix 3/17>>Lumbar fusion L3-L5 9/17>>VT with DC-CV; amio increased 10/17>>ramp with RV failure; speed decreased to 8800 02/2017> GIB (removed ASA, scopes negative) 03/2017>percutaneous lead repair after pump stops  10/18>>Slow VT. Cardioverted in ED and amiodarone uptitrated 12/18>>melena  with scope. Hgb 6.5. EGD with 2 AVMs with APC 12/18>>recurrent hematuria. 6 mm left ureteral stone 1/19>>7 mm left ureteral stone removal with laser lithotripsy; left ureteral stent. Resection of 6 cm bladder tumor 11/19>>Bladder tumor resections. Gross hematuria requiring CBI and radiation treatments.   Plan/Recommendations:  1. Please call VAD pager if any issues with VAD equipment or questions, concerns. 2. EGD scheduled at 3:45 pm today; pt will go to holding at 2:45 on batteries, will need VAD cart in holding/procedure. VAD Coordinator will accompany patient to procedure.  3. Daily dressing changes, next dressing change due 10/29/18. Bedside nurse may change dressing.    Zada Girt RN [place] Coordinator  Office: [phone]  24/7 Pager: [phone]

## 2018-10-24 NOTE — Progress Notes (Addendum)
VAD Coordinator Procedure Note:   VAD Coordinator met patient in holding room 52; report from bedside nurse given. Pt undergoing EGD per Dr. Mansouraty.  Hemodynamics and VAD parameters monitored by myself and anesthesia throughout the procedure. Blood pressures were obtained with automatic cuff on right arm and correlated with Doppler MAP.  Pt has Medtronic dual ICD with therapies on at 200 bpm; magnet available on anesthesia cart if needed.     Time: Doppler Auto  BP Flow PI Power Speed  Pre-procedure:           15:30 84 106/87 (91) 3.3 6.6 3.9 8400           Secation Induction:          16:00  86/74 (81) 3.4 6.5 3.9    16:10  116/98 (106) 3.0 6.3 3.8    16:20  118/101 (109) 3.0 6.0 3.7    16:25  104/85 (92) 3.1 6.5 3.   Recovery Area:          16:30  93/70 (76) 3.8 6.5 3.9    17:00 82  3.6 6.8 4.0              Patient tolerated the procedure well. VAD Coordinator accompanied and remained with patient in recovery area.   One small AVM in small bowel with APC therapy. Plan for capsule study tomorrow. Dr. Mansouraty updated pt and wife (via phone) after procedure.    Patient Disposition:  2H09  Molly Reece RN, VAD Coordinator 24/7 VAD Pager: [phone]  

## 2018-10-24 NOTE — Progress Notes (Addendum)
Advanced Heart Failure VAD Team Note  PCP-Cardiologist: No primary care provider on file.   Subjective:    Cr 1.8 - 1.9. Hgb 9.5 -> 8.4 -> 7.9 -> 9.3 s/p 1 u PRBCs.  Feeling OK. Hasn't been up much so hasn't noticed much of a difference. Denies SOB, lightheadedness or dizziness. Continues to have black stools, last BM 10/23/18.  LVAD Interrogation HM 2: Speed: 8400 Flow: 3.1 PI: 5.9 Power: 4.0. Rare PI events. No further low flows.   Objective:    Vital Signs:   Temp:  [97.4 F (36.3 C)-98.4 F (36.9 C)] 98.4 F (36.9 C) (12/20 0400) Pulse Rate:  [73-83] 73 (12/20 0600) Resp:  [12-21] 13 (12/20 0600) BP: (83-109)/(37-87) 86/73 (12/20 0443) SpO2:  [94 %-100 %] 100 % (12/20 0600) Weight:  [77.8 kg] 77.8 kg (12/20 0500) Last BM Date: 10/23/18 Mean arterial Pressure 70-80s   Intake/Output:   Intake/Output Summary (Last 24 hours) at 10/24/2018 0730 Last data filed at 10/24/2018 0600 Gross per 24 hour  Intake 725 ml  Output 1055 ml  Net -330 ml     Physical Exam    General: Elderly. NAD.  HEENT: Normal. Neck: Supple, JVP 7-8 cm. Carotids OK.  Cardiac:  Mechanical heart sounds with LVAD hum present.  Lungs:  CTAB, normal effort.  Abdomen:  NT, ND, no HSM. No bruits or masses. +BS  LVAD exit site: Well-healed and incorporated. Dressing dry and intact. No erythema or drainage. Stabilization device present and accurately applied. Driveline dressing changed daily per sterile technique. Extremities:  Warm and dry. No cyanosis, clubbing, rash, or edema.  Neuro:  Alert & oriented x 3. Cranial nerves grossly intact. Moves all 4 extremities w/o difficulty. Affect pleasant     Telemetry   NSR 70s, personally reviewed.   EKG    No new tracings.    Labs   Basic Metabolic Panel: Recent Labs  Lab 10/20/18 1422 10/22/18 1142 10/23/18 0238 10/24/18 0537  NA 133* 135 137 137  K 4.7 4.6 4.8 4.8  CL 100 104 105 107  CO2 24 23 22 22   GLUCOSE 101* 109* 89 99  BUN 42*  42* 38* 36*  CREATININE 1.95* 1.85* 1.80* 1.90*  CALCIUM 8.6* 8.6* 8.7* 8.8*  MG 2.2 2.2  --   --     Liver Function Tests: Recent Labs  Lab 10/20/18 1422 10/22/18 1142  AST 38 35  ALT 33 30  ALKPHOS 153* 143*  BILITOT 0.4 0.7  PROT 7.1 6.4*  ALBUMIN 3.5 3.2*   No results for input(s): LIPASE, AMYLASE in the last 168 hours. No results for input(s): AMMONIA in the last 168 hours.  CBC: Recent Labs  Lab 10/20/18 1422 10/22/18 1142 10/23/18 0238 10/23/18 1353 10/24/18 0537  WBC 5.5 6.2 4.2  --  6.2  NEUTROABS  --  4.1  --   --   --   HGB 9.5* 8.6* 7.9* 9.0* 9.3*  HCT 31.6* 27.7* 25.2* 28.9* 29.1*  MCV 101.6* 99.3 99.2  --  97.0  PLT 164 140* 129*  --  136*    INR: Recent Labs  Lab 10/20/18 1422 10/22/18 1142 10/23/18 0238 10/24/18 0537  INR 2.47 2.54 2.37 1.91    Other results:   Imaging   No results found.   Medications:     Scheduled Medications: . amiodarone  200 mg Oral BID  . atorvastatin  80 mg Oral QHS  . docusate sodium  100 mg Oral Daily  . feeding supplement  1 Container Oral BID BM  . levothyroxine  150 mcg Oral QAC breakfast  . multivitamin with minerals  1 tablet Oral Daily  . pantoprazole  40 mg Oral BID  . sotalol  80 mg Oral BID  . Warfarin - Pharmacist Dosing Inpatient   Does not apply q1800  . zinc sulfate  220 mg Oral Daily    Infusions: . sodium chloride Stopped (10/23/18 1000)    PRN Medications: sodium chloride, acetaminophen, ondansetron (ZOFRAN) IV, promethazine, temazepam   Patient Profile   [name] is a 77 y.o. male with h/o VT, PAD and severe systolic HF (EF 73-53%) due to mixed ischemic/nonischemic CM he is s/p HM II LVAD implant for destination therapy on October 19, 2013.VAD implantation was complicated by VT, syncope, AF/Aflutter and RHF. Required short term milrinone. Has had very complicated course recently with recurrent bladder tumors.   Direct admitted from home with frequent low flows and  malaise.   Assessment/Plan:    1. Low Flow Alarms on LVAD.  - VAD interrogated personally. Parameters stable s/p IVF and transfusion. - Now clear that this is likely in the setting of anemia.  - NSR by tele. No VT on ICD interrogation.  - VAD speed turned down to 8400 10/20/18 with no improvement.  - INR 2.54 -> 2.37 -> 1.91. Allowed to trend down with GI bleed. - LDH 196.   2. Chronic systolic HF: s/p HM-II LVAD 10/2014. s/p previous driveline [id number]. No further pump stops. [place] in 1/19 with mildly low output. Started on milrinone without benefitso it was stopped.  - No further low flow alarms with IVF and transfusion.  - Hgb with appropriate rise with transfusion. GI to scope today.  - VAD speed turned down to 8400 recently.  3. Recurrenthigh-gradebladder tumor: -S/p stone removal. 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 Had 2 BCG infusions with Dr. Gloriann Loan but discontinued due to inability to tolerate.-S/p repeat resection 7/19.  -s/p repeat bladder tumor excision in 11/19 with fast-growing tumor. C/b bilateral hydronephrosis and persistent hematuria requiring XRT. - Now planning palliative XRT with Dr. Tammi Klippel. Was planned for yesterday, now moved to 10/27/18. No further hematuria - No change to current plan.   4. CKD, stage III: Had AKI in setting of obstructive uropathy and ATN. Required HD x1(11/2017).  - Cr stable - Not interested in nephrostomy tubes for hydronephrosis 5.RecurrentVTwith hypotension and low flow alarms on VAD: Underwent DC-CV 9/17, 08/17/17, and 04/01/18. S/p emergent DC-CV in ER 07/09/18  - Remains in NSR.  - Continue amio. May need to stop sotalol soon or decrease dose if creatinine worsens. - Keep K > 4.0 Mg > 2.0 6.GIB: s/p clipping of 2 jejunal AVMs in 4/16. Continue coumadin and plavix.(with h/o CVA). Admitted 4/18 with recurrent GIB. Transfused 2u. EGD/colon negative. Admitted 12./18. hgb 6.5, Transfused 5u. EGD with  2 AVMs treated with APC.  - Up to 9.3 with 1 uPRBCs.  - GI to scope today.  - Hadbeen getting octreotide infusions but too expensive for him. Willcontinue to hold for now 7. Atrial arrhythmias: Atrial fibrillation, atrial tachycardia. s/p multiple DC-CVs last 07/02/18. 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  - Remains in NSR.  - Continueamiodaroneand sotalol.  8. HTN: - MAPs improved with fluid. No change.  9. Anticoagulation management:  - Goal INR 2-2.5. 1.9. Dosing per pharm-D.  10. Lumbar compression fracture: S/p L3-L5 fusion 02/03/16. - Stable.  11. 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 S/p R CEA 01/13/16.  Plavix stopped 12/18 due to recurrent GIB. - Continue atorvastatin (andwarfarin INR goal 2-2.5). - No change to current plan.   12. H/o amio-induced hypethyroidism: Followed by Dr. Forde Dandy in past.  - Amio restarted due to recurrent atrial arrhythmias and VT. - Follow.   I reviewed the LVAD parameters from today, and compared the results to the patient's prior recorded data.  No programming changes were made.  The LVAD is functioning within specified parameters.  The patient performs LVAD self-test daily.  LVAD interrogation was negative for any significant power changes, alarms or PI events/speed drops.  LVAD equipment check completed and is in good working order.  Back-up equipment present.   LVAD education done on emergency procedures and precautions and reviewed exit site care.   Length of Stay: 2  Annamaria Helling 10/24/2018, 7:30 AM  VAD Team --- VAD ISSUES ONLY--- Pager [phone] (7am - 7am)  Advanced Heart Failure Team  Pager [phone] (M-F; 7a - 4p)  Please contact [facility] for night-coverage after hours (4p -7a ) and weekends on [email or web address]  Patient seen and examined with the above-signed Advanced Practice Provider and/or Housestaff. I personally reviewed laboratory data, imaging studies and relevant notes. I independently examined the patient and  formulated the important aspects of the plan. I have edited the note to reflect any of my changes or salient points. I have personally discussed the plan with the patient and/or family.  Continues with melena but hgb up after transfusion. INR 1.9. Off coumadin. Volume status looks ok. No further low flows on VAD. Remains in NSR. LDH 196.   Plan Endoscopy today with GI. I discussed with patient and agree for the need to pursue further w/u of his bleeding.   Glori Bickers, MD  8:37 AM

## 2018-10-24 NOTE — Anesthesia Preprocedure Evaluation (Addendum)
Anesthesia Evaluation  Patient identified by MRN, date of birth, ID band Patient awake    Reviewed: Allergy & Precautions, NPO status , Patient's Chart, lab work & pertinent test results  History of Anesthesia Complications Negative for: history of anesthetic complications  Airway Mallampati: II  TM Distance: >3 FB Neck ROM: Full    Dental  (+) Edentulous Upper, Edentulous Lower   Pulmonary sleep apnea , former smoker,    breath sounds clear to auscultation       Cardiovascular hypertension, Pt. on medications + CAD, + Past MI, + Peripheral Vascular Disease and +CHF  + dysrhythmias Atrial Fibrillation + Cardiac Defibrillator  Rhythm:Regular Rate:Normal  '19 RHC - Biventricular failure despite VAD support  '19 TTE - LV cavity size was mildly dilated. LVAD present.  Aortic root is mildly dilated at 41 mm. LA was severely dilated. RV cavity size was mildly dilated. Systolic function was moderately to severely reduced. RA was mildly dilated.  Difficult to auscultate due to whirring sound from LVAD   Neuro/Psych TIACVA negative psych ROS   GI/Hepatic negative GI ROS, Neg liver ROS,   Endo/Other  Hypothyroidism   Renal/GU CRFRenal diseaseBladder Ca    Bladder cancer     Musculoskeletal  (+) Arthritis ,   Abdominal   Peds  Hematology  (+) anemia ,   Anesthesia Other Findings   Reproductive/Obstetrics                            Anesthesia Physical  Anesthesia Plan  ASA: IV  Anesthesia Plan: MAC   Post-op Pain Management:    Induction: Intravenous  PONV Risk Score and Plan: 1 and Treatment may vary due to age or medical condition and Propofol infusion  Airway Management Planned: Nasal Cannula and Natural Airway  Additional Equipment: None  Intra-op Plan:   Post-operative Plan:   Informed Consent: I have reviewed the patients History and Physical, chart, labs and discussed the  procedure including the risks, benefits and alternatives for the proposed anesthesia with the patient or authorized representative who has indicated his/her understanding and acceptance.     Plan Discussed with: CRNA and Anesthesiologist  Anesthesia Plan Comments:        Anesthesia Quick Evaluation

## 2018-10-24 NOTE — Transfer of Care (Signed)
Immediate Anesthesia Transfer of Care Note  Patient: [name]  Procedure(s) Performed: ENTEROSCOPY (N/A ) HOT HEMOSTASIS (ARGON PLASMA COAGULATION/BICAP) (N/A ) SUBMUCOSAL TATTOO INJECTION  Patient Location: Endoscopy Unit  Anesthesia Type:MAC  Level of Consciousness: drowsy and patient cooperative  Airway & Oxygen Therapy: Patient Spontanous Breathing and Patient connected to nasal cannula oxygen  Post-op Assessment: Report given to RN and Post -op Vital signs reviewed and stable  Post vital signs: Reviewed and stable  Last Vitals:  Vitals Value Taken Time  BP    Temp    Pulse    Resp    SpO2      Last Pain:  Vitals:   10/24/18 1532  TempSrc: Oral  PainSc: 0-No pain         Complications: No apparent anesthesia complications

## 2018-10-24 NOTE — Progress Notes (Signed)
ANTICOAGULATION CONSULT NOTE - [place] for warfarin Indication: LVAD  Allergies  Allergen Reactions  . Demerol [Meperidine] Other (See Comments)    Paralysis/ Could only move eyes    Patient Measurements: Height: 6' (182.9 cm) Weight: 171 lb 8.3 oz (77.8 kg) IBW/kg (Calculated) : 77.6  Vital Signs: Temp: 98.4 F (36.9 C) (12/20 0400) Temp Source: Oral (12/20 0400) BP: 86/73 (12/20 0443) Pulse Rate: 73 (12/20 0600)  Labs: Recent Labs    10/22/18 1142 10/23/18 0238 10/23/18 1353 10/24/18 0537  HGB 8.6* 7.9* 9.0* 9.3*  HCT 27.7* 25.2* 28.9* 29.1*  PLT 140* 129*  --  136*  LABPROT 27.0* 25.5*  --  21.6*  INR 2.54 2.37  --  1.91  CREATININE 1.85* 1.80*  --  1.90*    Estimated Creatinine Clearance: 34.6 mL/min (A) (by C-G formula based on SCr of 1.9 mg/dL (H)).   Medical History: Past Medical History:  Diagnosis Date  . Arthritis   . Automatic implantable cardioverter-defibrillator in situ    a. s/p Medtronic Evera device serial [id number]    . Bradycardia   . CAD (coronary artery disease)    a. s/p MI 1982;  s/p DES to CFX 2011;  s/p NSTEMI 4/12 in setting of VTach;  cath 7/12:  LM ok, LAD mid 30-40%, Dx's with 40%, mCFX stent patent, prox to mid RCA occluded with R to R and L to R collats.  Medical Tx was continued  . Chronic systolic heart failure ([place])    a. s/p LVAD 10/2013 for destination therapy  . CKD (chronic kidney disease)   . CVD (cerebrovascular disease)    a. s/p L CEA 2008  . Cytopenia   . Dysrhythmia    AFIB  . History of GI bleed    a. on ASA- started on plavix during 10/2015 admission for CVA  . HLD (hyperlipidemia)   . HTN (hypertension)   . Hypothyroidism   . Inappropriate therapy from implantable cardioverter-defibrillator    a. ATP for sinus tach SVT wavelet identified SVT but was no  passive (2014)  . Ischemic cardiomyopathy   . 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 L Ear   . Myocardial infarction ([place])    1992   . PAF (paroxysmal atrial fibrillation) ([place])   . PVD (peripheral vascular disease) ([place])    a. 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 Shortness of breath dyspnea    W/ EXERTION   . Sleep apnea    NO CPAP NOW  HE SENT IT BACK YRS AGO  . Stroke [facility])    a. 10/2015 admission   . Ventricular tachycardia ([place])    a. s/p AICD;   h/o ICD shock;  Amiodarone Rx. S/P ablation in 2012     Assessment: 49 yoM with HM II LVAD admitted with low flow alarms. Pt on warfarin with last dose at home 12/18. INR on admit 2.54 down to 1.9 with planned GI eval today, LDH 200s stable, H/H stable after PRBC.   *PTA Dose = 1mg  Tues, 2mg  all other days  Goal of Therapy:  INR 2-2.5 Monitor platelets by anticoagulation protocol: Yes   Plan:  -Holding warfarin for now with H/H trend and GI fidings -Follow protime -feraheme today for low Tsat/   Bonnita Nasuti Pharm.D. CPP, [place] Clinical Pharmacist [phone]  10/24/2018 7:47 AM

## 2018-10-24 NOTE — Discharge Summary (Addendum)
Advanced Heart Failure Team  Discharge Summary   Patient ID: [name] MRN: [id number], DOB/AGE: [birth year] 79 y.o. Admit date: 10/22/2018 D/C date:    10/28/18    Primary Discharge Diagnoses:  1. Low Flow Alarms on LVAD. 2.Acute GIB with melena and symptomatic anemia 3. Chronic systolic HF s/p LVAD 4. Recurrenthigh-gradebladder tumor: 5. CKD, stage III:  6.RecurrentVTwith hypotension and low flow alarms on VAD:  7. Atrial arrhythmias:  8. HTN: 9. Anticoagulation management:  10 CVA:  11. H/o amio-induced hypethyroidism:  12. Arterial hypotension  Hospital Course:   [name] is a 77 y.o. male with h/o VT, PAD and severe systolic HF (EF 25-63%) due to mixed ischemic/nonischemic CM he is s/p HM II LVAD implant for destination therapy on October 19, 2013.VAD implantation was complicated by VT, syncope, AF/Aflutter and RHF. Required short term milrinone.Has had very complicated course recently with recurrent bladder tumors.   Admitted 12/18 from home with frequent low flows and malaise.Found to have Hgb drop. Normal LDH, UA unremarkable. He improved symptomatically and had no further low flows after 1 L IVF given.   Hgb continued to trend down. Given 1 uPRBCs 10/23/18 with Hgb 7.9. GI consulted for EGD.   Taken for EGD 10/24/18 which showed one non-bleeding AVM with APC. Capsule 10/25/18, results delayed due to software upgrade and holiday. Pt had no further Hgb drops and started to have formed, brown stools.  Pt continued to have intermittent low flows 12/23 and 10/28/18. Florinef added 10/26/18. Midodrine added 10/27/18.  Got 1 L IVF 12/23.  Examined am of 10/28/18 and pt requesting to go home. With only acute management being dosing of his INR, decision was made for discharge with close follow up, and continued management of his INR via home INR check and page on 10/29/18.   Marinol added with poor oral intake in setting of Bladder CA.   LVAD  Interrogation HM 2: Speed: 8400 Flow: 3.5 PI: 3.6 Power: 4.0. 7 PI events. 2 low flow events this am with standing to urinate. Otherwise, none since yesterday am.   Discharge Weight: 168 lbs Discharge Vitals: Blood pressure 94/70, pulse 84, temperature 97.9 F (36.6 C), temperature source Oral, resp. rate 13, height 6' (1.829 m), weight 76.6 kg, SpO2 97 %.  Labs: Lab Results  Component Value Date   WBC 4.8 10/28/2018   HGB 8.8 (L) 10/28/2018   HCT 28.6 (L) 10/28/2018   MCV 100.7 (H) 10/28/2018   PLT 132 (L) 10/28/2018    Recent Labs  Lab 10/22/18 1142  10/28/18 0238  NA 135   < > 136  K 4.6   < > 4.6  CL 104   < > 106  CO2 23   < > 22  BUN 42*   < > 36*  CREATININE 1.85*   < > 1.81*  CALCIUM 8.6*   < > 8.5*  PROT 6.4*  --   --   BILITOT 0.7  --   --   ALKPHOS 143*  --   --   ALT 30  --   --   AST 35  --   --   GLUCOSE 109*   < > 97   < > = values in this interval not displayed.   Lab Results  Component Value Date   CHOL 106 11/05/2015   HDL 33 (L) 11/05/2015   LDLCALC 60 11/05/2015   TRIG 67 11/05/2015   BNP (last 3 results) No results for input(s): BNP  in the last 8760 hours.  ProBNP (last 3 results) No results for input(s): PROBNP in the last 8760 hours.   Diagnostic Studies/Procedures   No results found.  Discharge Medications   Allergies as of 10/28/2018      Reactions   Demerol [meperidine] Other (See Comments)   Paralysis/ Could only move eyes      Medication List    TAKE these medications   amiodarone 200 MG tablet Commonly known as:  PACERONE Take 1 tablet (200 mg total) by mouth 2 (two) times daily.   atorvastatin 80 MG tablet Commonly known as:  LIPITOR Take 1 tablet (80 mg total) by mouth at bedtime.   docusate sodium 100 MG capsule Commonly known as:  COLACE Take 100 mg by mouth daily.   dronabinol 2.5 MG capsule Commonly known as:  MARINOL Take 1 capsule (2.5 mg total) by mouth 2 (two) times daily before lunch and supper.    fludrocortisone 0.1 MG tablet Commonly known as:  FLORINEF Take 1 tablet (0.1 mg total) by mouth 2 (two) times daily.   furosemide 20 MG tablet Commonly known as:  LASIX Take 20 mg by mouth daily as needed for fluid.   ICAPS PO Take 1 tablet by mouth 2 (two) times daily.   levothyroxine 150 MCG tablet Commonly known as:  SYNTHROID, LEVOTHROID Take 1 tablet (150 mcg total) by mouth daily before breakfast.   midodrine 10 MG tablet Commonly known as:  PROAMATINE Take 1 tablet (10 mg total) by mouth 3 (three) times daily with meals.   multivitamin with minerals Tabs tablet Take 1 tablet by mouth daily.   pantoprazole 40 MG tablet Commonly known as:  PROTONIX TAKE 1 TABLET TWICE DAILY What changed:  when to take this   promethazine 12.5 MG tablet Commonly known as:  PHENERGAN Take 12.5 mg by mouth every 6 (six) hours as needed for nausea or vomiting.   protein supplement shake Liqd Commonly known as:  PREMIER PROTEIN Take 11 oz by mouth 2 (two) times daily.   sotalol 80 MG tablet Commonly known as:  BETAPACE Take 1 tablet (80 mg total) by mouth 2 (two) times daily.   temazepam 30 MG capsule Commonly known as:  RESTORIL Take 1 capsule (30 mg total) by mouth at bedtime as needed for sleep.   warfarin 2 MG tablet Commonly known as:  COUMADIN Take as directed. If you are unsure how to take this medication, talk to your nurse or doctor. Original instructions:  Take 2 mg daily, further as directed by VAD clinic. Start taking on:  October 29, 2018 What changed:    how much to take  how to take this  when to take this  additional instructions   zinc gluconate 50 MG tablet Take 50 mg by mouth 2 (two) times daily.       Disposition   The patient will be discharged in stable condition to home. Discharge Instructions    (HEART FAILURE PATIENTS) Call MD:  Anytime you have any of the following symptoms: 1) 3 pound weight gain in 24 hours or 5 pounds in 1 week 2)  shortness of breath, with or without a dry hacking cough 3) swelling in the hands, feet or stomach 4) if you have to sleep on extra pillows at night in order to breathe.   Complete by:  As directed    Diet - low sodium heart healthy   Complete by:  As directed    Increase activity slowly  Complete by:  As directed    STOP any activity that causes chest pain, shortness of breath, dizziness, sweating, or exessive weakness   Complete by:  As directed      Follow-up Information    [facility] Follow up on 11/04/2018.   Specialty:  Cardiology Why:  at 0900 for post hospital follow up. Code for parking 1900. Contact information: [address] [id number] [place] [phone]            Duration of Discharge Encounter: Greater than 35 minutes   Signed, Annamaria Helling  10/28/2018, 12:41 PM   Patient seen and examined with the above-signed Advanced Practice Provider and/or Housestaff. I personally reviewed laboratory data, imaging studies and relevant notes. I independently examined the patient and formulated the important aspects of the plan. I have edited the note to reflect any of my changes or salient points. I have personally discussed the plan with the patient and/or family.  Still with occasional low flows on VAD but feels ok. MAP checked personally and is in 70s. Hgb stable with no further melena. Results of capsule still pending. INR 1.65. Discussed dosing with PharmD personally. He is eager to go home today. Will let him go home on midodrine and florinef with close f/u in VAD clinic.   Glori Bickers, MD  9:11 PM

## 2018-10-24 NOTE — Anesthesia Procedure Notes (Signed)
Procedure Name: MAC Date/Time: 10/24/2018 4:03 PM Performed by: Lance Coon, CRNA Pre-anesthesia Checklist: Patient identified, Emergency Drugs available, Suction available, Patient being monitored and Timeout performed Patient Re-evaluated:Patient Re-evaluated prior to induction Oxygen Delivery Method: Nasal cannula

## 2018-10-24 NOTE — Op Note (Signed)
[facility] Patient Name: [name] Procedure Date : 10/24/2018 MRN: [id number] Attending MD: Justice Britain , MD Date of Birth: 04-08-1941 CSN: [id number] Age: 77 Admit Type: Inpatient Procedure:                Small bowel enteroscopy Indications:              Anemia, Melena, Obscure gastrointestinal bleeding,                            History of Recurrent Jejunal AVMs, INR currently <2                            but was therapeutic upon presentation Providers:                Justice Britain, MD, Carlyn Reichert, RN, Charolette Child, Technician, Lance Coon, CRNA Referring MD:             Shaune Pascal. Bensimhom, MD Medicines:                Monitored Anesthesia Care Complications:            No immediate complications. Estimated Blood Loss:     Estimated blood loss: none. Procedure:                Pre-Anesthesia Assessment:                           - Prior to the procedure, a History and Physical                            was performed, and patient medications and                            allergies were reviewed. The patient's tolerance of                            previous anesthesia was also reviewed. The risks                            and benefits of the procedure and the sedation                            options and risks were discussed with the patient.                            All questions were answered, and informed consent                            was obtained. Prior Anticoagulants: The patient has                            taken Coumadin (warfarin), last dose was 2 days  prior to procedure. ASA Grade Assessment: III - A                            patient with severe systemic disease. After                            reviewing the risks and benefits, the patient was                            deemed in satisfactory condition to undergo the                            procedure.                    After obtaining informed consent, the endoscope was                            passed under direct vision. Throughout the                            procedure, the patient's blood pressure, pulse, and                            oxygen saturations were monitored continuously. The                            [id number] ([id number] Olympus adult colon was                            introduced through the mouth and advanced to the                            proximal jejunum. The small bowel enteroscopy was                            accomplished without difficulty. The patient                            tolerated the procedure. Scope In: Scope Out: Findings:      No gross lesions were noted in the entire esophagus.      Localized moderately erythematous mucosa without bleeding was found in       the cardia.      No gross lesions were noted in the entire examined stomach.      There was no evidence of significant pathology in the duodenal bulb, in       the first portion of the duodenum, in the second portion of the       duodenum, in the third portion of the duodenum and in the fourth portion       of the duodenum.      A single angiodysplastic lesion with no bleeding was found in the       proximal jejunum. Coagulation for bleeding prevention using argon plasma       was successful.      There was no evidence of significant pathology in  the proximal jejunum.       Area was tattooed with an injection of Spot (carbon black). Impression:               - No gross lesions in esophagus.                           - Erythematous mucosa in the cardia.                           - No gross lesions in the stomach.                           - Normal duodenal bulb, first portion of the                            duodenum, second portion of the duodenum, third                            portion of the duodenum and fourth portion of the                            duodenum.                            - A single non-bleeding angiodysplastic lesion in                            the duodenum. Treated with argon plasma coagulation                            (APC).                           - The examined portion of the jejunum was normal.                            Tattooed.                           - No specimens collected. Recommendation:           - The patient will be observed post-procedure,                            until all discharge criteria are met.                           - Return patient to hospital ward for ongoing care.                           - Plan for Video Capsule Endoscopy on 12/21.                            Patient will receive 1/2 preparation in  anticipation of procedure tomorrow.                           - Trend Hgb/Hct.                           - Should receive 1 dose of IV Iron in regards to                            optimization of patient Iron stores.                           - Send H. pylori serology and treat if positive.                           - The findings and recommendations were discussed                            with the patient.                           - The findings and recommendations were discussed                            with the referring physician. Procedure Code(s):        --- Professional ---                           [phone], Small intestinal endoscopy, enteroscopy                            beyond second portion of duodenum, not including                            ileum; with control of bleeding (eg, injection,                            bipolar cautery, unipolar cautery, laser, heater                            probe, stapler, plasma coagulator)                           [id number], Unlisted procedure, small intestine Diagnosis Code(s):        --- Professional ---                           K31.89, Other diseases of stomach and duodenum                           K31.819, Angiodysplasia of  stomach and duodenum                            without bleeding  D64.9, Anemia, unspecified                           K92.1, Melena (includes Hematochezia)                           K92.2, Gastrointestinal hemorrhage, unspecified CPT copyright 2018 [employer] Association. All rights reserved. The codes documented in this report are preliminary and upon coder review may  be revised to meet current compliance requirements. Justice Britain, MD 10/24/2018 4:54:40 PM Number of Addenda: 0

## 2018-10-24 NOTE — Progress Notes (Signed)
          Daily Rounding Note  10/24/2018, 10:12 AM  LOS: 2 days   SUBJECTIVE:   Chief complaint: anemia, GIB Pt not seen, plan for enterscopy today.    OBJECTIVE:         Vital signs in last 24 hours:    Temp:  [97.5 F (36.4 C)-98.4 F (36.9 C)] 98.4 F (36.9 C) (12/20 0809) Pulse Rate:  [73-77] 73 (12/20 0737) Resp:  [12-21] 15 (12/20 0737) BP: (83-92)/(49-77) 92/77 (12/20 0737) SpO2:  [94 %-100 %] 98 % (12/20 0737) Weight:  [77.8 kg] 77.8 kg (12/20 0500) Last BM Date: 10/23/18 Filed Weights   10/23/18 0600 10/24/18 0500  Weight: 77.1 kg 77.8 kg   Not seen  Intake/Output from previous day: 12/19 0701 - 12/20 0700 In: 725 [P.O.:360; I.V.:50; Blood:315] Out: 1055 [Urine:1055]  Intake/Output this shift: No intake/output data recorded.  Lab Results: Recent Labs    10/22/18 1142 10/23/18 0238 10/23/18 1353 10/24/18 0537  WBC 6.2 4.2  --  6.2  HGB 8.6* 7.9* 9.0* 9.3*  HCT 27.7* 25.2* 28.9* 29.1*  PLT 140* 129*  --  136*   BMET Recent Labs    10/22/18 1142 10/23/18 0238 10/24/18 0537  NA 135 137 137  K 4.6 4.8 4.8  CL 104 105 107  CO2 23 22 22   GLUCOSE 109* 89 99  BUN 42* 38* 36*  CREATININE 1.85* 1.80* 1.90*  CALCIUM 8.6* 8.7* 8.8*   LFT Recent Labs    10/22/18 1142  PROT 6.4*  ALBUMIN 3.2*  AST 35  ALT 30  ALKPHOS 143*  BILITOT 0.7   PT/INR Recent Labs    10/23/18 0238 10/24/18 0537  LABPROT 25.5* 21.6*  INR 2.37 1.91     ASSESMENT:   *   Acute on chronic anemia.  Improved after 1 U PRBCs  *   Low grade GIB. Black stools for 3 d PTA.  S/p clipping jejunal AVMs 10/2017.   Recurrent SB bleeding suspected.    *   S/p LVAD, s/p ICD for severe CHF.    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    *   CKD with element of AKI.  Electrolytes ok.     PLAN   *   entersocopy today.    *   Feraheme infusion ordered.      Azucena Freed  10/24/2018,  10:12 AM Phone [phone]

## 2018-10-25 ENCOUNTER — Encounter (HOSPITAL_COMMUNITY)
Admission: AD | Disposition: A | Discharge status: Home / Self Care | Payer: Self-pay | Source: Home / Self Care | Attending: Internal Medicine

## 2018-10-25 ENCOUNTER — Encounter: Payer: Self-pay | Admitting: Gastroenterology

## 2018-10-25 HISTORY — PX: GIVENS CAPSULE STUDY: SHX5432

## 2018-10-25 LAB — CBC
HCT: 29 % — ABNORMAL LOW (ref 39.0–52.0)
Hemoglobin: 9.3 g/dL — ABNORMAL LOW (ref 13.0–17.0)
MCH: 31.4 pg (ref 26.0–34.0)
MCHC: 32.1 g/dL (ref 30.0–36.0)
MCV: 98 fL (ref 80.0–100.0)
Platelets: 140 10*3/uL — ABNORMAL LOW (ref 150–400)
RBC: 2.96 MIL/uL — ABNORMAL LOW (ref 4.22–5.81)
RDW: 18.6 % — ABNORMAL HIGH (ref 11.5–15.5)
WBC: 5.7 10*3/uL (ref 4.0–10.5)
nRBC: 0 % (ref 0.0–0.2)

## 2018-10-25 LAB — BASIC METABOLIC PANEL
Anion gap: 8 (ref 5–15)
BUN: 31 mg/dL — ABNORMAL HIGH (ref 8–23)
CO2: 23 mmol/L (ref 22–32)
Calcium: 9.1 mg/dL (ref 8.9–10.3)
Chloride: 107 mmol/L (ref 98–111)
Creatinine, Ser: 1.77 mg/dL — ABNORMAL HIGH (ref 0.61–1.24)
GFR calc Af Amer: 41 mL/min — ABNORMAL LOW (ref >60)
GFR calc non Af Amer: 36 mL/min — ABNORMAL LOW (ref >60)
Glucose, Bld: 96 mg/dL (ref 70–99)
Potassium: 4.8 mmol/L (ref 3.5–5.1)
Sodium: 138 mmol/L (ref 135–145)

## 2018-10-25 LAB — PROTIME-INR
INR: 1.58
Prothrombin Time: 18.7 seconds — ABNORMAL HIGH (ref 11.4–15.2)

## 2018-10-25 LAB — LACTATE DEHYDROGENASE: LDH: 197 U/L — ABNORMAL HIGH (ref 98–192)

## 2018-10-25 SURGERY — IMAGING PROCEDURE, GI TRACT, INTRALUMINAL, VIA CAPSULE

## 2018-10-25 MED ORDER — WARFARIN SODIUM 3 MG PO TABS
3.0000 mg | ORAL_TABLET | Freq: Once | ORAL | Status: AC
  Administered 2018-10-25: 3 mg via ORAL
  Filled 2018-10-25: qty 1

## 2018-10-25 NOTE — Progress Notes (Signed)
ANTICOAGULATION CONSULT NOTE - [place] for warfarin Indication: LVAD  Allergies  Allergen Reactions  . Demerol [Meperidine] Other (See Comments)    Paralysis/ Could only move eyes    Patient Measurements: Height: 6' (182.9 cm) Weight: 171 lb 8.3 oz (77.8 kg) IBW/kg (Calculated) : 77.6  Vital Signs: Temp: 97.9 F (36.6 C) (12/21 0700) Temp Source: Oral (12/21 0700) BP: 87/75 (12/21 1200) Pulse Rate: 74 (12/21 1200)  Labs: Recent Labs    10/23/18 0238 10/23/18 1353 10/24/18 0537 10/25/18 0255  HGB 7.9* 9.0* 9.3* 9.3*  HCT 25.2* 28.9* 29.1* 29.0*  PLT 129*  --  136* 140*  LABPROT 25.5*  --  21.6* 18.7*  INR 2.37  --  1.91 1.58  CREATININE 1.80*  --  1.90* 1.77*    Estimated Creatinine Clearance: 37.1 mL/min (A) (by C-G formula based on SCr of 1.77 mg/dL (H)).   Medical History: Past Medical History:  Diagnosis Date  . Arthritis   . Automatic implantable cardioverter-defibrillator in situ    a. s/p Medtronic Evera device serial [id number]    . Bradycardia   . CAD (coronary artery disease)    a. s/p MI 1982;  s/p DES to CFX 2011;  s/p NSTEMI 4/12 in setting of VTach;  cath 7/12:  LM ok, LAD mid 30-40%, Dx's with 40%, mCFX stent patent, prox to mid RCA occluded with R to R and L to R collats.  Medical Tx was continued  . Chronic systolic heart failure ([place])    a. s/p LVAD 10/2013 for destination therapy  . CKD (chronic kidney disease)   . CVD (cerebrovascular disease)    a. s/p L CEA 2008  . Cytopenia   . Dysrhythmia    AFIB  . History of GI bleed    a. on ASA- started on plavix during 10/2015 admission for CVA  . HLD (hyperlipidemia)   . HTN (hypertension)   . Hypothyroidism   . Inappropriate therapy from implantable cardioverter-defibrillator    a. ATP for sinus tach SVT wavelet identified SVT but was no  passive (2014)  . Ischemic cardiomyopathy   . 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 L Ear   . Myocardial infarction ([place])    1992   . PAF (paroxysmal atrial fibrillation) ([place])   . PVD (peripheral vascular disease) ([place])    a. 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 Shortness of breath dyspnea    W/ EXERTION   . Sleep apnea    NO CPAP NOW  HE SENT IT BACK YRS AGO  . Stroke [facility])    a. 10/2015 admission   . Ventricular tachycardia ([place])    a. s/p AICD;   h/o ICD shock;  Amiodarone Rx. S/P ablation in 2012     Assessment: 20 yoM with HM II LVAD admitted with low flow alarms. Pt on warfarin with last dose at home yesterday.  INR on admit 2.54 down to 1.58 today, hold off on heaprin at this time - will resume warfarin tonight. LDH stable.  One lesion found and addressed with APC yesterday, capsule endoscopy underway today, results back early next week.   *PTA Dose = 1mg  Tues, 2mg  all other days  Goal of Therapy:  INR 2-2.5 Monitor platelets by anticoagulation protocol: Yes   Plan:  Give warfarin 3mg  tonight Daily  INR for now  Erin Hearing PharmD., BCPS Clinical Pharmacist 10/25/2018 2:18 PM

## 2018-10-25 NOTE — Progress Notes (Signed)
Endo capsule study ended at 2010. Patients equipemnt removed and placed in personal belongings bag for Endo to pick up on 12/22 AM. Bag is labeled and patient stated he would watch out for the bag. All staff instructed to leave the bag where it is and not to throw anything away without contacting RN first. Patient more at ease now that he can see the equipment bag. Will continue to monitor closely.

## 2018-10-25 NOTE — Progress Notes (Signed)
     [facility] Progress Note   Hemoglobin overall stable. Capsule endoscopy underway. Will return early next week when the capsule endoscopy results are available. Please call the on-call gastroenterologist with any questions or concerns in the meantime.  Thornton Park, MD, MPH  10/25/2018, 12:38 PM

## 2018-10-25 NOTE — Progress Notes (Signed)
Patient ingested givens capsule for small bowel endoscopy 10/25/2018 at 0800. Patient is remain on telemetry for 12 full hours while study is completed. Patient will be NPO for 2 full hours following capsule ingestion. PO intake per written and verbal instructions given to patient and primary RN. Study concludesand equipment can be removed  10/25/2018 at 2000 . Endoscopy staff will collect equipment 10/26/18 AM.

## 2018-10-25 NOTE — Progress Notes (Signed)
Advanced Heart Failure VAD Team Note  PCP-Cardiologist: No primary care provider on file.   Subjective:    Feeling ok. Denies CP, orthopnea, PND or SOB  Capsule endo 12/20 with one non-bleeding AVM with APC. Capsule now underway. Hgb stable   LVAD Interrogation HM 2: Speed: 8400 Flow: 3.2 PI: 5.8 Power: 4.0. VAD interrogated personally. Parameters stable. No further low flows  .   Objective:    Vital Signs:   Temp:  [97.4 F (36.3 C)-98.4 F (36.9 C)] 97.9 F (36.6 C) (12/21 0700) Pulse Rate:  [74-121] 74 (12/21 1200) Resp:  [9-29] 14 (12/21 1200) BP: (85-106)/(69-87) 87/75 (12/21 1200) SpO2:  [85 %-98 %] 98 % (12/21 1200) Weight:  [77.8 kg] 77.8 kg (12/20 1532) Last BM Date: 10/23/18 Mean arterial Pressure 70-80s   Intake/Output:   Intake/Output Summary (Last 24 hours) at 10/25/2018 1402 Last data filed at 10/25/2018 1200 Gross per 24 hour  Intake 1070 ml  Output 325 ml  Net 745 ml     Physical Exam    General: Elderly. NAD.  HEENT: Normal. Neck: Supple, JVP 7-8 cm. Carotids OK.  Cardiac:  Mechanical heart sounds with LVAD hum present.  Lungs:  CTAB, normal effort.  Abdomen:  NT, ND, no HSM. No bruits or masses. +BS  LVAD exit site: Well-healed and incorporated. Dressing dry and intact. No erythema or drainage. Stabilization device present and accurately applied. Driveline dressing changed daily per sterile technique. Extremities:  Warm and dry. No cyanosis, clubbing, rash, or edema.  Neuro:  Alert & oriented x 3. Cranial nerves grossly intact. Moves all 4 extremities w/o difficulty. Affect pleasant     Telemetry   NSR 70s, personally reviewed.   EKG    No new tracings.    Labs   Basic Metabolic Panel: Recent Labs  Lab 10/20/18 1422 10/22/18 1142 10/23/18 0238 10/24/18 0537 10/25/18 0255  NA 133* 135 137 137 138  K 4.7 4.6 4.8 4.8 4.8  CL 100 104 105 107 107  CO2 24 23 22 22 23   GLUCOSE 101* 109* 89 99 96  BUN 42* 42* 38* 36* 31*    CREATININE 1.95* 1.85* 1.80* 1.90* 1.77*  CALCIUM 8.6* 8.6* 8.7* 8.8* 9.1  MG 2.2 2.2  --   --   --     Liver Function Tests: Recent Labs  Lab 10/20/18 1422 10/22/18 1142  AST 38 35  ALT 33 30  ALKPHOS 153* 143*  BILITOT 0.4 0.7  PROT 7.1 6.4*  ALBUMIN 3.5 3.2*   No results for input(s): LIPASE, AMYLASE in the last 168 hours. No results for input(s): AMMONIA in the last 168 hours.  CBC: Recent Labs  Lab 10/20/18 1422 10/22/18 1142 10/23/18 0238 10/23/18 1353 10/24/18 0537 10/25/18 0255  WBC 5.5 6.2 4.2  --  6.2 5.7  NEUTROABS  --  4.1  --   --   --   --   HGB 9.5* 8.6* 7.9* 9.0* 9.3* 9.3*  HCT 31.6* 27.7* 25.2* 28.9* 29.1* 29.0*  MCV 101.6* 99.3 99.2  --  97.0 98.0  PLT 164 140* 129*  --  136* 140*    INR: Recent Labs  Lab 10/20/18 1422 10/22/18 1142 10/23/18 0238 10/24/18 0537 10/25/18 0255  INR 2.47 2.54 2.37 1.91 1.58    Other results:   Imaging   No results found.   Medications:     Scheduled Medications: . amiodarone  200 mg Oral BID  . atorvastatin  80 mg Oral QHS  .  bisacodyl  10 mg Oral Once  . docusate sodium  100 mg Oral Daily  . feeding supplement  1 Container Oral BID BM  . levothyroxine  150 mcg Oral QAC breakfast  . multivitamin with minerals  1 tablet Oral Daily  . pantoprazole  40 mg Oral BID  . sotalol  80 mg Oral BID  . Warfarin - Pharmacist Dosing Inpatient   Does not apply q1800  . zinc sulfate  220 mg Oral Daily    Infusions: . sodium chloride Stopped (10/23/18 1000)    PRN Medications: sodium chloride, acetaminophen, ondansetron (ZOFRAN) IV, promethazine, temazepam   Patient Profile   [name] is a 77 y.o. male with h/o VT, PAD and severe systolic HF (EF 09-32%) due to mixed ischemic/nonischemic CM he is s/p HM II LVAD implant for destination therapy on October 19, 2013.VAD implantation was complicated by VT, syncope, AF/Aflutter and RHF. Required short term milrinone. Has had very complicated course  recently with recurrent bladder tumors.   Direct admitted from home with frequent low flows and malaise.   Assessment/Plan:    1. Low Flow Alarms on LVAD.  - VAD interrogated personally. Parameters stable. s/p IVF and transfusion. No further low flows.  - VAD speed turned down to 8400 10/20/18  - INR 2.54 -> 2.37 -> 1.91 -> 1.58. off coumadin with GI bleed. Will restart coumadin today. May need to start heparin soon - LDH 197   2. Chronic systolic HF: s/p HM-II LVAD 10/2014. s/p previous driveline [id number]. No further pump stops. [place] in 1/19 with mildly low output. Started on milrinone without benefitso it was stopped.  - No further low flow alarms with IVF and transfusion.  - Hgb with appropriate rise with transfusion. - Volume status stable 3. Recurrenthigh-gradebladder tumor: -S/p stone removal. 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 Had 2 BCG infusions with Dr. Gloriann Loan but discontinued due to inability to tolerate.-S/p repeat resection 7/19.  -s/p repeat bladder tumor excision in 11/19 with fast-growing tumor. C/b bilateral hydronephrosis and persistent hematuria requiring XRT. - Now planning palliative XRT with Dr. Tammi Klippel. Was planned for yesterday, now moved to 10/27/18. No further hematuria - No change to current plan.   4. CKD, stage III: Had AKI in setting of obstructive uropathy and ATN. Required HD x1(11/2017).  - Creatinine improved to 1.7 today - Not interested in nephrostomy tubes for hydronephrosis 5.RecurrentVTwith hypotension and low flow alarms on VAD: Underwent DC-CV 9/17, 08/17/17, and 04/01/18. S/p emergent DC-CV in ER 07/09/18  - Remains in NSR.  - Continue amio. May need to stop sotalol soon or decrease dose if creatinine worsens. - Keep K > 4.0 Mg > 2.0 6.Acute GIB with melena: s/p clipping of 2 jejunal AVMs in 4/16. Continue coumadin and plavix.(with h/o CVA). Admitted 4/18 with recurrent GIB. Transfused 2u. EGD/colon  negative. Admitted 12./18. hgb 6.5, Transfused 5u. EGD with 2 AVMs treated with APC.  - Up to 9.3 with 1 uPRBCs.  - Capsule endo 12/20 with one non-bleeding AVM with APC. Capsule now underway. Hgb stable. Appreciate GI input - Hadbeen getting octreotide infusions but too expensive for him. Willcontinue to hold for now 7. Atrial arrhythmias: Atrial fibrillation, atrial tachycardia. s/p multiple DC-CVs last 07/02/18. 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  - Remains in NSR.  - Continueamiodaroneand sotalol.  8. HTN: - MAPs improved with fluid. No change.    I reviewed the LVAD parameters from today, and compared the results to the patient's prior  recorded data.  No programming changes were made.  The LVAD is functioning within specified parameters.  The patient performs LVAD self-test daily.  LVAD interrogation was negative for any significant power changes, alarms or PI events/speed drops.  LVAD equipment check completed and is in good working order.  Back-up equipment present.   LVAD education done on emergency procedures and precautions and reviewed exit site care.   Length of Stay: 3  Glori Bickers, MD 10/25/2018, 2:02 PM  VAD Team --- VAD ISSUES ONLY--- Pager [phone] (7am - 7am)  Advanced Heart Failure Team  Pager [phone] (M-F; 7a - 4p)  Please contact [facility] for night-coverage after hours (4p -7a ) and weekends on [email or web address]

## 2018-10-26 ENCOUNTER — Encounter (HOSPITAL_COMMUNITY): Payer: Self-pay | Admitting: Gastroenterology

## 2018-10-26 LAB — CBC
HCT: 28.9 % — ABNORMAL LOW (ref 39.0–52.0)
Hemoglobin: 9.2 g/dL — ABNORMAL LOW (ref 13.0–17.0)
MCH: 31.2 pg (ref 26.0–34.0)
MCHC: 31.8 g/dL (ref 30.0–36.0)
MCV: 98 fL (ref 80.0–100.0)
Platelets: 148 10*3/uL — ABNORMAL LOW (ref 150–400)
RBC: 2.95 MIL/uL — ABNORMAL LOW (ref 4.22–5.81)
RDW: 18.5 % — ABNORMAL HIGH (ref 11.5–15.5)
WBC: 5.6 10*3/uL (ref 4.0–10.5)
nRBC: 0 % (ref 0.0–0.2)

## 2018-10-26 LAB — PROTIME-INR
INR: 1.56
Prothrombin Time: 18.5 seconds — ABNORMAL HIGH (ref 11.4–15.2)

## 2018-10-26 LAB — LACTATE DEHYDROGENASE: LDH: 183 U/L (ref 98–192)

## 2018-10-26 LAB — BASIC METABOLIC PANEL
Anion gap: 9 (ref 5–15)
BUN: 34 mg/dL — ABNORMAL HIGH (ref 8–23)
CHLORIDE: 104 mmol/L (ref 98–111)
CO2: 22 mmol/L (ref 22–32)
Calcium: 9.1 mg/dL (ref 8.9–10.3)
Creatinine, Ser: 1.92 mg/dL — ABNORMAL HIGH (ref 0.61–1.24)
GFR calc Af Amer: 38 mL/min — ABNORMAL LOW (ref >60)
GFR calc non Af Amer: 32 mL/min — ABNORMAL LOW (ref >60)
Glucose, Bld: 95 mg/dL (ref 70–99)
Potassium: 4.4 mmol/L (ref 3.5–5.1)
Sodium: 135 mmol/L (ref 135–145)

## 2018-10-26 LAB — GLUCOSE, CAPILLARY: Glucose-Capillary: 106 mg/dL — ABNORMAL HIGH (ref 70–99)

## 2018-10-26 MED ORDER — WARFARIN SODIUM 3 MG PO TABS
3.0000 mg | ORAL_TABLET | Freq: Once | ORAL | Status: AC
  Administered 2018-10-26: 3 mg via ORAL
  Filled 2018-10-26: qty 1

## 2018-10-26 MED ORDER — SODIUM CHLORIDE 0.9 % IV BOLUS
500.0000 mL | Freq: Once | INTRAVENOUS | Status: AC
  Administered 2018-10-26: 500 mL via INTRAVENOUS

## 2018-10-26 MED ORDER — FLUDROCORTISONE ACETATE 0.1 MG PO TABS
0.1000 mg | ORAL_TABLET | Freq: Two times a day (BID) | ORAL | Status: DC
  Administered 2018-10-26 – 2018-10-28 (×5): 0.1 mg via ORAL
  Filled 2018-10-26 (×6): qty 1

## 2018-10-26 NOTE — Progress Notes (Signed)
Advanced Heart Failure VAD Team Note  PCP-Cardiologist: No primary care provider on file.   Subjective:    Feeling ok. Denies SOB, orthopnea, melena or PND. No dizziness.   Had 3 low flows this am and about 6 PI events.   Capsule endo 12/20 with one non-bleeding AVM with APC. Capsule completed. Await results. Hgb stable   LVAD Interrogation HM 2: Speed: 8400 Flow: 3.7 PI: 4.8 Power: 4.0. VAD interrogated personally. 3 low flows this am and ~6 PI events .   Objective:    Vital Signs:   Temp:  [97.6 F (36.4 C)-99 F (37.2 C)] 98.3 F (36.8 C) (12/22 0300) Pulse Rate:  [73-81] 73 (12/22 0300) Resp:  [13-21] 16 (12/22 0906) BP: (73-107)/(60-82) 91/76 (12/22 0300) SpO2:  [92 %-99 %] 99 % (12/21 1914) Weight:  [75.7 kg] 75.7 kg (12/22 0300) Last BM Date: 10/23/18 Mean arterial Pressure 70-80s   Intake/Output:   Intake/Output Summary (Last 24 hours) at 10/26/2018 1238 Last data filed at 10/26/2018 0900 Gross per 24 hour  Intake 840 ml  Output 600 ml  Net 240 ml     Physical Exam    General:  NAD.  HEENT: normal  Neck: supple. JVP not elevated.  Carotids 2+ bilat; no bruits. No lymphadenopathy or thryomegaly appreciated. Cor: LVAD hum.  Lungs: Clear. Abdomen: obese soft, nontender, non-distended. No hepatosplenomegaly. No bruits or masses. Good bowel sounds. Driveline site clean. Anchor in place.  Extremities: no cyanosis, clubbing, rash. Warm no edema  Neuro: alert & oriented x 3. No focal deficits. Moves all 4 without problem    Telemetry   Apaced 70s, personally reviewed.   EKG    No new tracings.    Labs   Basic Metabolic Panel: Recent Labs  Lab 10/20/18 1422 10/22/18 1142 10/23/18 0238 10/24/18 0537 10/25/18 0255 10/26/18 0225  NA 133* 135 137 137 138 135  K 4.7 4.6 4.8 4.8 4.8 4.4  CL 100 104 105 107 107 104  CO2 24 23 22 22 23 22   GLUCOSE 101* 109* 89 99 96 95  BUN 42* 42* 38* 36* 31* 34*  CREATININE 1.95* 1.85* 1.80* 1.90* 1.77* 1.92*    CALCIUM 8.6* 8.6* 8.7* 8.8* 9.1 9.1  MG 2.2 2.2  --   --   --   --     Liver Function Tests: Recent Labs  Lab 10/20/18 1422 10/22/18 1142  AST 38 35  ALT 33 30  ALKPHOS 153* 143*  BILITOT 0.4 0.7  PROT 7.1 6.4*  ALBUMIN 3.5 3.2*   No results for input(s): LIPASE, AMYLASE in the last 168 hours. No results for input(s): AMMONIA in the last 168 hours.  CBC: Recent Labs  Lab 10/22/18 1142 10/23/18 0238 10/23/18 1353 10/24/18 0537 10/25/18 0255 10/26/18 0225  WBC 6.2 4.2  --  6.2 5.7 5.6  NEUTROABS 4.1  --   --   --   --   --   HGB 8.6* 7.9* 9.0* 9.3* 9.3* 9.2*  HCT 27.7* 25.2* 28.9* 29.1* 29.0* 28.9*  MCV 99.3 99.2  --  97.0 98.0 98.0  PLT 140* 129*  --  136* 140* 148*    INR: Recent Labs  Lab 10/22/18 1142 10/23/18 0238 10/24/18 0537 10/25/18 0255 10/26/18 0225  INR 2.54 2.37 1.91 1.58 1.56    Other results:   Imaging   No results found.   Medications:     Scheduled Medications: . amiodarone  200 mg Oral BID  . atorvastatin  80  mg Oral QHS  . bisacodyl  10 mg Oral Once  . docusate sodium  100 mg Oral Daily  . feeding supplement  1 Container Oral BID BM  . levothyroxine  150 mcg Oral QAC breakfast  . multivitamin with minerals  1 tablet Oral Daily  . pantoprazole  40 mg Oral BID  . sotalol  80 mg Oral BID  . warfarin  3 mg Oral ONCE-1800  . Warfarin - Pharmacist Dosing Inpatient   Does not apply q1800  . zinc sulfate  220 mg Oral Daily    Infusions: . sodium chloride Stopped (10/23/18 1000)    PRN Medications: sodium chloride, acetaminophen, ondansetron (ZOFRAN) IV, promethazine, temazepam   Patient Profile   [name] is a 77 y.o. male with h/o VT, PAD and severe systolic HF (EF 34-74%) due to mixed ischemic/nonischemic CM he is s/p HM II LVAD implant for destination therapy on October 19, 2013.VAD implantation was complicated by VT, syncope, AF/Aflutter and RHF. Required short term milrinone. Has had very complicated course  recently with recurrent bladder tumors.   Direct admitted from home with frequent low flows and malaise.   Assessment/Plan:    1. Low Flow Alarms on LVAD.  - VAD interrogated personally. Parameters stable. s/p IVF and transfusion. Low flows resolved and now returned. Likely volume sensitive.  - VAD speed turned down to 8400 10/20/18  - Will give 500 cc NS and add florinef 0.1 bid 2. Chronic systolic HF: s/p HM-II LVAD 10/2014. s/p previous driveline [id number]. No further pump stops. [place] in 1/19 with mildly low output. Started on milrinone without benefitso it was stopped.  - Volume status probably a little low with low flows Plan as above.  3.Acute GIB with melena and symptomatic ABL: s/p clipping of 2 jejunal AVMs in 4/16. Continue coumadin and plavix.(with h/o CVA). Admitted 4/18 with recurrent GIB. Transfused 2u. EGD/colon negative. Admitted 12./18. hgb 6.5, Transfused 5u. EGD with 2 AVMs treated with APC.  - Up to 9.3 with 1 uPRBCs.  - EGD 12/20 with one non-bleeding AVM with APC. Capsule now completed. Await results - Hgb stable 9.2 today. Appreciate GI input - Hadbeen getting octreotide infusions but too expensive for him. Willcontinue to hold for now 4. Recurrenthigh-gradebladder tumor: -S/p stone removal. 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 Had 2 BCG infusions with Dr. Gloriann Loan but discontinued due to inability to tolerate.-S/p repeat resection 7/19.  -s/p repeat bladder tumor excision in 11/19 with fast-growing tumor. C/b bilateral hydronephrosis and persistent hematuria requiring XRT. - Now planning palliative XRT with Dr. Tammi Klippel. Was planned for yesterday, now moved to 10/27/18. No further hematuria - No change to current plan.   5. CKD, stage III: Had AKI in setting of obstructive uropathy and ATN. Required HD x1(11/2017).  - Creatinine ranging 1.7-1.9. 1.9 today - Not interested in nephrostomy tubes for  hydronephrosis 6.RecurrentVTwith hypotension and low flow alarms on VAD: Underwent DC-CV 9/17, 08/17/17, and 04/01/18. S/p emergent DC-CV in ER 07/09/18  - Remains in NSR.  - Continue amio. May need to stop sotalol soon or decrease dose if creatinine worsens. - Keep K > 4.0 Mg > 2.0 7. Atrial arrhythmias: Atrial fibrillation, atrial tachycardia. s/p multiple DC-CVs last 07/02/18. 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  - Remains in NSR.  - Continueamiodaroneand sotalol.  8. HTN: - MAPs improved with fluid. No change.  - BP may increase with florinef 9. VAD management - Coumadin restarted yesterday. INR 1.56 Discussed dosing with  PharmD personally. - LDH 183 - If INR dropping may need short course of heparin but hopefully not  I reviewed the LVAD parameters from today, and compared the results to the patient's prior recorded data.  No programming changes were made.  The LVAD is functioning within specified parameters.  The patient performs LVAD self-test daily.  LVAD interrogation was negative for any significant power changes, alarms or PI events/speed drops.  LVAD equipment check completed and is in good working order.  Back-up equipment present.   LVAD education done on emergency procedures and precautions and reviewed exit site care.   Length of Stay: [place], MD 10/26/2018, 12:38 PM  VAD Team --- VAD ISSUES ONLY--- Pager [phone] (7am - 7am)  Advanced Heart Failure Team  Pager [phone] (M-F; 7a - 4p)  Please contact [facility] for night-coverage after hours (4p -7a ) and weekends on [email or web address]

## 2018-10-27 ENCOUNTER — Ambulatory Visit: Payer: Medicare Other

## 2018-10-27 ENCOUNTER — Ambulatory Visit: Payer: Medicare Other | Admitting: Radiation Oncology

## 2018-10-27 LAB — BASIC METABOLIC PANEL
ANION GAP: 11 (ref 5–15)
BUN: 39 mg/dL — ABNORMAL HIGH (ref 8–23)
CO2: 21 mmol/L — ABNORMAL LOW (ref 22–32)
Calcium: 8.8 mg/dL — ABNORMAL LOW (ref 8.9–10.3)
Chloride: 105 mmol/L (ref 98–111)
Creatinine, Ser: 1.73 mg/dL — ABNORMAL HIGH (ref 0.61–1.24)
GFR calc Af Amer: 43 mL/min — ABNORMAL LOW (ref >60)
GFR calc non Af Amer: 37 mL/min — ABNORMAL LOW (ref >60)
Glucose, Bld: 102 mg/dL — ABNORMAL HIGH (ref 70–99)
POTASSIUM: 4.9 mmol/L (ref 3.5–5.1)
Sodium: 137 mmol/L (ref 135–145)

## 2018-10-27 LAB — CBC
HCT: 28.2 % — ABNORMAL LOW (ref 39.0–52.0)
Hemoglobin: 9.2 g/dL — ABNORMAL LOW (ref 13.0–17.0)
MCH: 32.4 pg (ref 26.0–34.0)
MCHC: 32.6 g/dL (ref 30.0–36.0)
MCV: 99.3 fL (ref 80.0–100.0)
NRBC: 0 % (ref 0.0–0.2)
Platelets: 154 10*3/uL (ref 150–400)
RBC: 2.84 MIL/uL — ABNORMAL LOW (ref 4.22–5.81)
RDW: 18.6 % — ABNORMAL HIGH (ref 11.5–15.5)
WBC: 5.3 10*3/uL (ref 4.0–10.5)

## 2018-10-27 LAB — PROTIME-INR
INR: 1.63
Prothrombin Time: 19.2 seconds — ABNORMAL HIGH (ref 11.4–15.2)

## 2018-10-27 LAB — LACTATE DEHYDROGENASE: LDH: 170 U/L (ref 98–192)

## 2018-10-27 MED ORDER — WARFARIN SODIUM 3 MG PO TABS
3.0000 mg | ORAL_TABLET | Freq: Once | ORAL | Status: AC
  Administered 2018-10-27: 3 mg via ORAL
  Filled 2018-10-27: qty 1

## 2018-10-27 MED ORDER — SODIUM CHLORIDE 0.9 % IV BOLUS
500.0000 mL | Freq: Once | INTRAVENOUS | Status: AC
  Administered 2018-10-27: 500 mL via INTRAVENOUS

## 2018-10-27 MED ORDER — MIDODRINE HCL 5 MG PO TABS
5.0000 mg | ORAL_TABLET | Freq: Three times a day (TID) | ORAL | Status: DC
  Administered 2018-10-27 – 2018-10-28 (×3): 5 mg via ORAL
  Filled 2018-10-27 (×4): qty 1

## 2018-10-27 NOTE — Progress Notes (Addendum)
Phoned Alyse Low, RN on [facility]  to inquire if patient is stable for radiation treatment today. Alyse Low, RN requested to phone LVAD coordinator to determine if there is availability to accompany patient for treatment today. Alyse Low, RN phoned this RN back. She verbalized her understanding that Tammi Klippel and Bensimhon decided the patient should begin xrt Thursday, December 26th. Per Nira Conn, RT on L1 patient is scheduled for 3:30 pm treatment. Will phone [facility] to inform nurse of planned treatment on Thursday at 3:30 pm so [facility] can be arranged.

## 2018-10-27 NOTE — Progress Notes (Signed)
LVAD Coordinator Rounding Note:  Admitted 10/22/18 from home with continuous low flow alarms since 0630 this morning. Patient reports feeling weak. Reported having black stools for 2 days.    HM II Implanted 10/19/13 by Dr. Cyndia Bent under Destination Therapy criteria.  Pt lying in bed this am. No black stool. Pt has had 18 Low Flows between 0800-0830 this morning.   Vital signs: Temp:  97.7 HR: 75 regular Doppler Pressure: 62 Automatic cuff:  81/71 (76) O2 Sat: 99%on RA  Weight:  169.9>171.5>167.1  lbs  LVAD interrogation reveals:  Speed: 8400  Flow:2.8 Power: 3.6w PI: 3.7  Alarms:  18 LF this morning, 3 LF yesterday Events:none  Fixed speed: 8400 Low speed limit: 8000  Drive Line: Driveline dressing C/D/I. Weekly dressings performed by bedside nurse or SO [name]. Next dressing change due 10/29/18.   Labs:  LDH trend: 195>178>196>170  INR trend: 2.54>2.37>1.91>1.63  Hgb trend:  8.6>7.9>9.3>9.2  Blood products this admission: - 10/23/18 > 1 unit PRBCs   Anticoagulation Plan: - INR Goal: 2.0 - 2.5 (coumadin on hold due to current GI bleed) - ASA Dose: no ASA (hx of GI bleed)   Device: - Dual Medtronic ICD - VF therapies: on at 200 bpm - AT/AF monitor on: 140 bpm  - Last device check 09/30/18 per Tomi Bamberger; underlying rhythm [place]  Significant Events on VAD Support:  09/01/14>>ureteral stone with cystoscopy and flexible ureteroscopy with lithotripsy and basket extraction 02/2015 >>GIB with 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 Started on Plavix 3/17>>Lumbar fusion L3-L5 9/17>>VT with DC-CV; amio increased 10/17>>ramp with RV failure; speed decreased to 8800 02/2017> GIB (removed ASA, scopes negative) 03/2017>percutaneous lead repair after pump stops  10/18>>Slow VT. Cardioverted in ED and  amiodarone uptitrated 12/18>>melena with scope. Hgb 6.5. EGD with 2 AVMs with APC 12/18>>recurrent hematuria. 6 mm left ureteral stone 1/19>>7 mm left ureteral stone removal with laser lithotripsy; left ureteral stent. Resection of 6 cm bladder tumor 11/19>>Bladder tumor resections. Gross hematuria requiring CBI and radiation treatments.   Plan/Recommendations:  1. Please call VAD pager if any issues with VAD equipment or questions, concerns. 2. Weekly dressing changes, next dressing change due 10/29/18. Bedside nurse may change dressing.    Tanda Rockers RN [place] Coordinator  Office: [phone]  24/7 Pager: [phone]

## 2018-10-27 NOTE — Progress Notes (Signed)
ANTICOAGULATION CONSULT NOTE - [place] for warfarin Indication: LVAD  Allergies  Allergen Reactions  . Demerol [Meperidine] Other (See Comments)    Paralysis/ Could only move eyes    Patient Measurements: Height: 6' (182.9 cm) Weight: 167 lb 1.7 oz (75.8 kg) IBW/kg (Calculated) : 77.6  Vital Signs: Temp: 97.7 F (36.5 C) (12/23 0727) Temp Source: Oral (12/23 0727) BP: 81/71 (12/23 0727) Pulse Rate: 42 (12/23 0727)  Labs: Recent Labs    10/25/18 0255 10/26/18 0225 10/27/18 0221  HGB 9.3* 9.2* 9.2*  HCT 29.0* 28.9* 28.2*  PLT 140* 148* 154  LABPROT 18.7* 18.5* 19.2*  INR 1.58 1.56 1.63  CREATININE 1.77* 1.92* 1.73*    Estimated Creatinine Clearance: 37.1 mL/min (A) (by C-G formula based on SCr of 1.73 mg/dL (H)).   Medical History: Past Medical History:  Diagnosis Date  . Arthritis   . Automatic implantable cardioverter-defibrillator in situ    a. s/p Medtronic Evera device serial [id number]    . Bradycardia   . CAD (coronary artery disease)    a. s/p MI 1982;  s/p DES to CFX 2011;  s/p NSTEMI 4/12 in setting of VTach;  cath 7/12:  LM ok, LAD mid 30-40%, Dx's with 40%, mCFX stent patent, prox to mid RCA occluded with R to R and L to R collats.  Medical Tx was continued  . Chronic systolic heart failure ([place])    a. s/p LVAD 10/2013 for destination therapy  . CKD (chronic kidney disease)   . CVD (cerebrovascular disease)    a. s/p L CEA 2008  . Cytopenia   . Dysrhythmia    AFIB  . History of GI bleed    a. on ASA- started on plavix during 10/2015 admission for CVA  . HLD (hyperlipidemia)   . HTN (hypertension)   . Hypothyroidism   . Inappropriate therapy from implantable cardioverter-defibrillator    a. ATP for sinus tach SVT wavelet identified SVT but was no  passive (2014)  . Ischemic cardiomyopathy   . 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 L Ear   . Myocardial infarction ([place])    1992  . PAF (paroxysmal atrial fibrillation) ([place])    . PVD (peripheral vascular disease) ([place])    a. 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 Shortness of breath dyspnea    W/ EXERTION   . Sleep apnea    NO CPAP NOW  HE SENT IT BACK YRS AGO  . Stroke [facility])    a. 10/2015 admission   . Ventricular tachycardia ([place])    a. s/p AICD;   h/o ICD shock;  Amiodarone Rx. S/P ablation in 2012     Assessment: 50 yoM with HM II LVAD admitted with low flow alarms. Pt on warfarin with last dose at home yesterday.  INR on admit 2.54 down to 1.5 after being held  for endoscopy and capsule study.   Warfarin restarted 12/21 INR now 1.6.   LDH stable.  One lesion found and addressed with APC,  capsule endoscopy underway today, results pending.   *PTA Dose = 1mg  Tues, 2mg  all other days  Goal of Therapy:  INR 2-2.5 Monitor platelets by anticoagulation protocol: Yes   Plan:  Give warfarin 3mg  tonight Daily INR for now  [facility] Pharm.D. CPP, BCPS Clinical Pharmacist [phone] 10/27/2018  11:10 AM

## 2018-10-27 NOTE — Care Management Important Message (Signed)
Important Message  Patient Details  Name: [name] MRN: [id number] Date of Birth: 11-01-1941   Medicare Important Message Given:  Yes    Barb Merino Deisha Stull 10/27/2018, 2:58 PM

## 2018-10-27 NOTE — Progress Notes (Signed)
     [facility] Progress Note   Hemoglobin remains stable. Patient denies overt bleeding.  Unable to have the capsule endoscopy images evaluated yesterday. [employer] software update occurred this weekend. Conversations with [employer] tech support yesterday resulted in need for [facility] [employer] to install some new software. Have placed an urgent request to have this addressed today.   Additional challenge will now be the timing of interpretation when the results are available given the holiday week. I do not read capsule endoscopies and I am actively trying to find someone who can interpret the results after the software update is complete.   I explained the weekend events to the patient, who expressed appropriate frustration with the delay. I assured him that I would continue to work to get the results as soon as possible.    [place], MD, MPH  10/27/2018, 9:21 AM

## 2018-10-27 NOTE — Progress Notes (Signed)
pts doppler pressure 60 (cuff pressure 65/53 with a map of 58) pt is alert and oriented. States he feels just fine. paged on call heart failure number. No response at this time. Will monitor.

## 2018-10-27 NOTE — Progress Notes (Addendum)
Advanced Heart Failure VAD Team Note  PCP-Cardiologist: No primary care provider on file.   Subjective:    Had 3 low flows 10/26/18. Given IVF and florinef added.   Feeling Ok this am. Denies SOB. No further black stools. Flows running low. Hadn't had any further low flow alarms since IVF, but had 2 again this am while provider in room.   EGD 12/20 with one non-bleeding AVM with APC. Capsule 10/25/18. Results pending. Hgb stable.   LVAD Interrogation HM 2: Speed: 8400 Flow: 2.8 PI: 5.0 Power: 4.0. No PI events since given IVF. Had 2 low flow events during my examination this am, sitting up on edge of bed, eating breakfast.  Flow frequently showing as "- - -"  Objective:    Vital Signs:   Temp:  [97.7 F (36.5 C)-99.3 F (37.4 C)] 97.7 F (36.5 C) (12/23 0727) Pulse Rate:  [42-75] 42 (12/23 0727) Resp:  [13-17] 14 (12/23 0727) BP: (78-125)/(60-81) 81/71 (12/23 0727) SpO2:  [99 %] 99 % (12/23 0727) Weight:  [75.8 kg] 75.8 kg (12/23 0500) Last BM Date: 10/23/18 Mean arterial Pressure 70-80s   Intake/Output:   Intake/Output Summary (Last 24 hours) at 10/27/2018 0802 Last data filed at 10/27/2018 0800 Gross per 24 hour  Intake 600 ml  Output 1000 ml  Net -400 ml     Physical Exam    General: NAD.  HEENT: Normal. Neck: Supple, JVP not elevated. Carotids OK.  Cardiac:  Mechanical heart sounds with LVAD hum present.  Lungs:  CTAB, normal effort.  Abdomen:  NT, ND, no HSM. No bruits or masses. +BS  LVAD exit site: Well-healed and incorporated. Dressing dry and intact. No erythema or drainage. Stabilization device present and accurately applied. Driveline dressing changed daily per sterile technique. Extremities:  Warm and dry. No cyanosis, clubbing, rash, or edema.  Neuro:  Alert & oriented x 3. Cranial nerves grossly intact. Moves all 4 extremities w/o difficulty. Affect pleasant     Telemetry   A paced 70s, personally reviewed.   EKG    No new tracings.    Labs    Basic Metabolic Panel: Recent Labs  Lab 10/20/18 1422 10/22/18 1142 10/23/18 0238 10/24/18 0537 10/25/18 0255 10/26/18 0225 10/27/18 0221  NA 133* 135 137 137 138 135 137  K 4.7 4.6 4.8 4.8 4.8 4.4 4.9  CL 100 104 105 107 107 104 105  CO2 24 23 22 22 23 22  21*  GLUCOSE 101* 109* 89 99 96 95 102*  BUN 42* 42* 38* 36* 31* 34* 39*  CREATININE 1.95* 1.85* 1.80* 1.90* 1.77* 1.92* 1.73*  CALCIUM 8.6* 8.6* 8.7* 8.8* 9.1 9.1 8.8*  MG 2.2 2.2  --   --   --   --   --     Liver Function Tests: Recent Labs  Lab 10/20/18 1422 10/22/18 1142  AST 38 35  ALT 33 30  ALKPHOS 153* 143*  BILITOT 0.4 0.7  PROT 7.1 6.4*  ALBUMIN 3.5 3.2*   No results for input(s): LIPASE, AMYLASE in the last 168 hours. No results for input(s): AMMONIA in the last 168 hours.  CBC: Recent Labs  Lab 10/22/18 1142 10/23/18 0238 10/23/18 1353 10/24/18 0537 10/25/18 0255 10/26/18 0225 10/27/18 0221  WBC 6.2 4.2  --  6.2 5.7 5.6 5.3  NEUTROABS 4.1  --   --   --   --   --   --   HGB 8.6* 7.9* 9.0* 9.3* 9.3* 9.2* 9.2*  HCT 27.7*  25.2* 28.9* 29.1* 29.0* 28.9* 28.2*  MCV 99.3 99.2  --  97.0 98.0 98.0 99.3  PLT 140* 129*  --  136* 140* 148* 154    INR: Recent Labs  Lab 10/23/18 0238 10/24/18 0537 10/25/18 0255 10/26/18 0225 10/27/18 0221  INR 2.37 1.91 1.58 1.56 1.63    Other results:   Imaging   No results found.   Medications:     Scheduled Medications: . amiodarone  200 mg Oral BID  . atorvastatin  80 mg Oral QHS  . bisacodyl  10 mg Oral Once  . docusate sodium  100 mg Oral Daily  . feeding supplement  1 Container Oral BID BM  . fludrocortisone  0.1 mg Oral BID  . levothyroxine  150 mcg Oral QAC breakfast  . multivitamin with minerals  1 tablet Oral Daily  . pantoprazole  40 mg Oral BID  . sotalol  80 mg Oral BID  . Warfarin - Pharmacist Dosing Inpatient   Does not apply q1800  . zinc sulfate  220 mg Oral Daily    Infusions: . sodium chloride Stopped (10/23/18 1000)     PRN Medications: sodium chloride, acetaminophen, ondansetron (ZOFRAN) IV, promethazine, temazepam   Patient Profile   [name] is a 77 y.o. male with h/o VT, PAD and severe systolic HF (EF 26-94%) due to mixed ischemic/nonischemic CM he is s/p HM II LVAD implant for destination therapy on October 19, 2013.VAD implantation was complicated by VT, syncope, AF/Aflutter and RHF. Required short term milrinone. Has had very complicated course recently with recurrent bladder tumors.   Direct admitted from home with frequent low flows and malaise.   Assessment/Plan:    1. Low Flow Alarms on LVAD.  - VAD interrogated personally. Parameters stable. s/p IVF and transfusion.  - Recurrent this am. Very volume sensitive. Will give additional 500 cc of fluid. - VAD speed turned down to 8400 10/20/18  - Florinef 0.1 mg BID added 10/26/18.  2. Chronic systolic HF: s/p HM-II LVAD 10/2014. s/p previous driveline [id number]. No further pump stops. [place] in 1/19 with mildly low output. Started on milrinone without benefitso it was stopped.  - Volume status improved after IVF and florinef added 10/26/18.  3.Acute GIB with melena and symptomatic ABL: s/p clipping of 2 jejunal AVMs in 4/16. Continue coumadin and plavix.(with h/o CVA). Admitted 4/18 with recurrent GIB. Transfused 2u. EGD/colon negative. Admitted 12./18. hgb 6.5, Transfused 5u. EGD with 2 AVMs treated with APC.  - EGD 12/20 with one non-bleeding AVM with APC. Capsule now completed. Await results - Hgb stable 9.2 today. Appreciate GI input - Hadbeen getting octreotide infusions but too expensive for him. Willcontinue to hold for now 4. Recurrenthigh-gradebladder tumor: -S/p stone removal. 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 Had 2 BCG infusions with Dr. Gloriann Loan but discontinued due to inability to tolerate.-S/p repeat resection 7/19.  -s/p repeat bladder tumor excision in 11/19 with fast-growing  tumor. C/b bilateral hydronephrosis and persistent hematuria requiring XRT. - Now planning palliative XRT with Dr. Tammi Klippel. Was planned for last week. Had been moved to today, but will likely need to be adjust further. No further hematuria - No change to current plan.   5. CKD, stage III: Had AKI in setting of obstructive uropathy and ATN. Required HD x1(11/2017).  - Creatinine ranging 1.7-1.9. 1.73 today.  - Not interested in nephrostomy tubes for hydronephrosis 6.RecurrentVTwith hypotension and low flow alarms on VAD: Underwent DC-CV 9/17, 08/17/17, and 04/01/18. S/p emergent  DC-CV in ER 07/09/18  - Remains in NSR.  - Continue amio. May need to stop sotalol soon or decrease dose if creatinine worsens. - Keep K > 4.0 Mg > 2.0 7. Atrial arrhythmias: Atrial fibrillation, atrial tachycardia. s/p multiple DC-CVs last 07/02/18. 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  - Remains in NSR.  - Continueamiodaroneand sotalol.  8. HTN: - MAPs improved with fluid. No change.  - BP may increase with florinef 9. VAD management - Coumadin restarted 10/25/18. INR 1.63. Dosing per PharmD.  - LDH 170 - If INR drops may need short course of heparin but hopefully not  I reviewed the LVAD parameters from today, and compared the results to the patient's prior recorded data.  No programming changes were made.  The LVAD is functioning within specified parameters.  The patient performs LVAD self-test daily.  LVAD interrogation was negative for any significant power changes, alarms or PI events/speed drops.  LVAD equipment check completed and is in good working order.  Back-up equipment present.   LVAD education done on emergency procedures and precautions and reviewed exit site care.   Length of Stay: [address]  Annamaria Helling 10/27/2018, 8:02 AM  VAD Team --- VAD ISSUES ONLY--- Pager [phone] (7am - 7am)  Advanced Heart Failure Team  Pager [phone] (M-F; 7a - 4p)  Please contact [facility] for  night-coverage after hours (4p -7a ) and weekends on [email or web address]  Patient seen and examined with the above-signed Advanced Practice Provider and/or Housestaff. I personally reviewed laboratory data, imaging studies and relevant notes. I independently examined the patient and formulated the important aspects of the plan. I have edited the note to reflect any of my changes or salient points. I have personally discussed the plan with the patient and/or family.  Continues to have low flow events on VAD with low MAPs. Florinef started yesterday. Will give more IVF and add midodrine. HGb stable. No further melena. Awaiting results of capsule study. INR 1.63 today. Back on coumadin. Discussed dosing with PharmD personally.  Glori Bickers, MD  5:11 PM

## 2018-10-27 NOTE — Plan of Care (Signed)
  Problem: Education: Goal: Knowledge of General Education information will improve Description Including pain rating scale, medication(s)/side effects and non-pharmacologic comfort measures Outcome: Progressing   Problem: Health Behavior/Discharge Planning: Goal: Ability to manage health-related needs will improve Outcome: Progressing   Problem: Clinical Measurements: Goal: Ability to maintain clinical measurements within normal limits will improve Outcome: Progressing Goal: Will remain free from infection Outcome: Progressing Goal: Diagnostic test results will improve Outcome: Progressing Goal: Respiratory complications will improve Outcome: Progressing Goal: Cardiovascular complication will be avoided Outcome: Progressing   Problem: Activity: Goal: Risk for activity intolerance will decrease Outcome: Progressing   Problem: Nutrition: Goal: Adequate nutrition will be maintained Outcome: Progressing   Problem: Coping: Goal: Level of anxiety will decrease Outcome: Progressing   Problem: Elimination: Goal: Will not experience complications related to bowel motility Outcome: Progressing Goal: Will not experience complications related to urinary retention Outcome: Progressing   Problem: Pain Managment: Goal: General experience of comfort will improve Outcome: Progressing   Problem: Safety: Goal: Ability to remain free from injury will improve Outcome: Progressing   Problem: Skin Integrity: Goal: Risk for impaired skin integrity will decrease Outcome: Progressing   Problem: Cardiac: Goal: LVAD will function as expected and patient will experience no clinical alarms Outcome: Progressing   Problem: Education: Goal: Patient will understand all VAD equipment and how it functions Outcome: Progressing Goal: Patient will be able to verbalize current INR target range and antiplatelet therapy for discharge home Outcome: Progressing   

## 2018-10-28 ENCOUNTER — Ambulatory Visit: Payer: Medicare Other

## 2018-10-28 LAB — CBC
HCT: 28.6 % — ABNORMAL LOW (ref 39.0–52.0)
Hemoglobin: 8.8 g/dL — ABNORMAL LOW (ref 13.0–17.0)
MCH: 31 pg (ref 26.0–34.0)
MCHC: 30.8 g/dL (ref 30.0–36.0)
MCV: 100.7 fL — ABNORMAL HIGH (ref 80.0–100.0)
Platelets: 132 10*3/uL — ABNORMAL LOW (ref 150–400)
RBC: 2.84 MIL/uL — ABNORMAL LOW (ref 4.22–5.81)
RDW: 18.8 % — ABNORMAL HIGH (ref 11.5–15.5)
WBC: 4.8 10*3/uL (ref 4.0–10.5)
nRBC: 0 % (ref 0.0–0.2)

## 2018-10-28 LAB — BASIC METABOLIC PANEL
Anion gap: 8 (ref 5–15)
BUN: 36 mg/dL — ABNORMAL HIGH (ref 8–23)
CO2: 22 mmol/L (ref 22–32)
Calcium: 8.5 mg/dL — ABNORMAL LOW (ref 8.9–10.3)
Chloride: 106 mmol/L (ref 98–111)
Creatinine, Ser: 1.81 mg/dL — ABNORMAL HIGH (ref 0.61–1.24)
GFR calc Af Amer: 40 mL/min — ABNORMAL LOW (ref >60)
GFR calc non Af Amer: 35 mL/min — ABNORMAL LOW (ref >60)
Glucose, Bld: 97 mg/dL (ref 70–99)
Potassium: 4.6 mmol/L (ref 3.5–5.1)
Sodium: 136 mmol/L (ref 135–145)

## 2018-10-28 LAB — TSH: TSH: 4.757 u[IU]/mL — ABNORMAL HIGH (ref 0.350–4.500)

## 2018-10-28 LAB — PROTIME-INR
INR: 1.62
PROTHROMBIN TIME: 19.1 s — AB (ref 11.4–15.2)

## 2018-10-28 LAB — LACTATE DEHYDROGENASE: LDH: 166 U/L (ref 98–192)

## 2018-10-28 MED ORDER — FLUDROCORTISONE ACETATE 0.1 MG PO TABS: 0.1000 mg | ORAL_TABLET | Freq: Two times a day (BID) | ORAL | 6 refills | Status: DC

## 2018-10-28 MED ORDER — MIDODRINE HCL 5 MG PO TABS: 10.0000 mg | ORAL_TABLET | Freq: Three times a day (TID) | ORAL | Status: DC

## 2018-10-28 MED ORDER — MIDODRINE HCL 10 MG PO TABS: 10.0000 mg | ORAL_TABLET | Freq: Three times a day (TID) | ORAL | 3 refills | Status: AC

## 2018-10-28 MED ORDER — WARFARIN SODIUM 5 MG PO TABS
5.0000 mg | ORAL_TABLET | Freq: Once | ORAL | Status: AC
  Administered 2018-10-28: 5 mg via ORAL
  Filled 2018-10-28: qty 1

## 2018-10-28 MED ORDER — MIDODRINE HCL 10 MG PO TABS: 10.0000 mg | ORAL_TABLET | Freq: Three times a day (TID) | ORAL | 3 refills | Status: DC

## 2018-10-28 MED ORDER — WARFARIN SODIUM 2 MG PO TABS: ORAL_TABLET | ORAL | Status: AC

## 2018-10-28 MED ORDER — DRONABINOL 2.5 MG PO CAPS: 2.5000 mg | ORAL_CAPSULE | Freq: Two times a day (BID) | ORAL | 3 refills | Status: DC

## 2018-10-28 MED FILL — FLUDROCORTISONE 0.1 MG TAB: 0.1 | 3 days supply | Qty: 6 | Fill #0

## 2018-10-28 MED FILL — MIDODRINE HCL 10 MG TABLET: 10 | 3 days supply | Qty: 9 | Fill #0

## 2018-10-28 NOTE — Discharge Instructions (Signed)
Information on my medicine - Coumadin®   (Warfarin) ° °This medication education was reviewed with me or my healthcare representative as part of my discharge preparation.   ° °Why was Coumadin prescribed for you? °Coumadin was prescribed for you because you have a blood clot or a medical condition that can cause an increased risk of forming blood clots. Blood clots can cause serious health problems by blocking the flow of blood to the heart, lung, or brain. Coumadin can prevent harmful blood clots from forming. °As a reminder your indication for Coumadin is:   Blood Clot Prevention After Heart Pump Surgery ° °What test will check on my response to Coumadin? °While on Coumadin (warfarin) you will need to have an INR test regularly to ensure that your dose is keeping you in the desired range. The INR (international normalized ratio) number is calculated from the result of the laboratory test called prothrombin time (PT). ° °If an INR APPOINTMENT HAS NOT ALREADY BEEN MADE FOR YOU please schedule an appointment to have this lab work done by your health care provider within 7 days. °Your INR goal is a number between:  2-2.5  ° °What  do you need to  know  About  COUMADIN? °Take Coumadin (warfarin) exactly as prescribed by your healthcare provider about the same time each day.  DO NOT stop taking without talking to the doctor who prescribed the medication.  Stopping without other blood clot prevention medication to take the place of Coumadin may increase your risk of developing a new clot or stroke.  Get refills before you run out. ° °What do you do if you miss a dose? °If you miss a dose, take it as soon as you remember on the same day then continue your regularly scheduled regimen the next day.  Do not take two doses of Coumadin at the same time. ° °Important Safety Information °A possible side effect of Coumadin (Warfarin) is an increased risk of bleeding. You should call your healthcare provider right away if you  experience any of the following: °? Bleeding from an injury or your nose that does not stop. °? Unusual colored urine (red or dark brown) or unusual colored stools (red or black). °? Unusual bruising for unknown reasons. °? A serious fall or if you hit your head (even if there is no bleeding). ° °Some foods or medicines interact with Coumadin® (warfarin) and might alter your response to warfarin. To help avoid this: °? Eat a balanced diet, maintaining a consistent amount of Vitamin K. °? Notify your provider about major diet changes you plan to make. °? Avoid alcohol or limit your intake to 1 drink for women and 2 drinks for men per day. °(1 drink is 5 oz. wine, 12 oz. beer, or 1.5 oz. liquor.) ° °Make sure that ANY health care provider who prescribes medication for you knows that you are taking Coumadin (warfarin).  Also make sure the healthcare provider who is monitoring your Coumadin knows when you have started a new medication including herbals and non-prescription products. ° °Coumadin® (Warfarin)  Major Drug Interactions  °Increased Warfarin Effect Decreased Warfarin Effect  °Alcohol (large quantities) °Antibiotics (esp. Septra/Bactrim, Flagyl, Cipro) °Amiodarone (Cordarone) °Aspirin (ASA) °Cimetidine (Tagamet) °Megestrol (Megace) °NSAIDs (ibuprofen, naproxen, etc.) °Piroxicam (Feldene) °Propafenone (Rythmol SR) °Propranolol (Inderal) °Isoniazid (INH) °Posaconazole (Noxafil) Barbiturates (Phenobarbital) °Carbamazepine (Tegretol) °Chlordiazepoxide (Librium) °Cholestyramine (Questran) °Griseofulvin °Oral Contraceptives °Rifampin °Sucralfate (Carafate) °Vitamin K  ° °Coumadin® (Warfarin) Major Herbal Interactions  °Increased Warfarin Effect Decreased Warfarin Effect  °  Garlic °Ginseng °Ginkgo biloba Coenzyme Q10 °Green tea °St. John’s wort   ° °Coumadin® (Warfarin) FOOD Interactions  °Eat a consistent number of servings per week of foods HIGH in Vitamin K °(1 serving = ½ cup)  °Collards (cooked, or boiled &  drained) °Kale (cooked, or boiled & drained) °Mustard greens (cooked, or boiled & drained) °Parsley *serving size only = ¼ cup °Spinach (cooked, or boiled & drained) °Swiss chard (cooked, or boiled & drained) °Turnip greens (cooked, or boiled & drained)  °Eat a consistent number of servings per week of foods MEDIUM-HIGH in Vitamin K °(1 serving = 1 cup)  °Asparagus (cooked, or boiled & drained) °Broccoli (cooked, boiled & drained, or raw & chopped) °Brussel sprouts (cooked, or boiled & drained) *serving size only = ½ cup °Lettuce, raw (green leaf, endive, romaine) °Spinach, raw °Turnip greens, raw & chopped  ° °These websites have more information on Coumadin (warfarin):  www.coumadin.com; °www.ahrq.gov/consumer/coumadin.htm; ° ° ° °

## 2018-10-28 NOTE — Progress Notes (Addendum)
Advanced Heart Failure VAD Team Note  PCP-Cardiologist: No primary care provider on file.   Subjective:    Had 3 low flows 10/26/18. Given IVF and florinef added.   2 low flows this am while standing to urinate. Otherwise stable with brown stools. INR remains sub-therapeutic at 1.62. Denies lightheadedness or dizziness. No SOB.   EGD 12/20 with one non-bleeding AVM with APC. Capsule 10/25/18. Results pending. Hgb stable.   LVAD Interrogation HM 2: Speed: 8400 Flow: 3.5 PI: 3.6 Power: 4.0. 7 PI events. 2 low flow events this am with standing to urinate. Otherwise, none since yesterday am.   Objective:    Vital Signs:   Temp:  [97.3 F (36.3 C)-98.6 F (37 C)] 98 F (36.7 C) (12/24 0415) Pulse Rate:  [71-84] 83 (12/24 0415) Resp:  [13-21] 13 (12/24 0415) BP: (65-90)/(53-78) 79/68 (12/23 1600) SpO2:  [98 %-100 %] 100 % (12/23 2323) Weight:  [76.6 kg] 76.6 kg (12/24 0415) Last BM Date: 10/23/18 Mean arterial Pressure 70s  Intake/Output:   Intake/Output Summary (Last 24 hours) at 10/28/2018 0743 Last data filed at 10/28/2018 0420 Gross per 24 hour  Intake -  Output 500 ml  Net -500 ml     Physical Exam   General: Well appearing this am. NAD.  HEENT: Normal. Neck: Supple, JVP 7-8 cm. Carotids OK.  Cardiac:  Mechanical heart sounds with LVAD hum present.  Lungs:  CTAB, normal effort.  Abdomen:  NT, ND, no HSM. No bruits or masses. +BS  LVAD exit site: Well-healed and incorporated. Dressing dry and intact. No erythema or drainage. Stabilization device present and accurately applied. Driveline dressing changed daily per sterile technique. Extremities:  Warm and dry. No cyanosis, clubbing, rash, or edema.  Neuro:  Alert & oriented x 3. Cranial nerves grossly intact. Moves all 4 extremities w/o difficulty. Affect pleasant     Telemetry   A paced 70s, personally reviewed  EKG    No new tracings.    Labs   Basic Metabolic Panel: Recent Labs  Lab 10/22/18 1142   10/24/18 0537 10/25/18 0255 10/26/18 0225 10/27/18 0221 10/28/18 0238  NA 135   < > 137 138 135 137 136  K 4.6   < > 4.8 4.8 4.4 4.9 4.6  CL 104   < > 107 107 104 105 106  CO2 23   < > 22 23 22  21* 22  GLUCOSE 109*   < > 99 96 95 102* 97  BUN 42*   < > 36* 31* 34* 39* 36*  CREATININE 1.85*   < > 1.90* 1.77* 1.92* 1.73* 1.81*  CALCIUM 8.6*   < > 8.8* 9.1 9.1 8.8* 8.5*  MG 2.2  --   --   --   --   --   --    < > = values in this interval not displayed.    Liver Function Tests: Recent Labs  Lab 10/22/18 1142  AST 35  ALT 30  ALKPHOS 143*  BILITOT 0.7  PROT 6.4*  ALBUMIN 3.2*   No results for input(s): LIPASE, AMYLASE in the last 168 hours. No results for input(s): AMMONIA in the last 168 hours.  CBC: Recent Labs  Lab 10/22/18 1142  10/24/18 0537 10/25/18 0255 10/26/18 0225 10/27/18 0221 10/28/18 0238  WBC 6.2   < > 6.2 5.7 5.6 5.3 4.8  NEUTROABS 4.1  --   --   --   --   --   --   HGB 8.6*   < >  9.3* 9.3* 9.2* 9.2* 8.8*  HCT 27.7*   < > 29.1* 29.0* 28.9* 28.2* 28.6*  MCV 99.3   < > 97.0 98.0 98.0 99.3 100.7*  PLT 140*   < > 136* 140* 148* 154 132*   < > = values in this interval not displayed.    INR: Recent Labs  Lab 10/24/18 0537 10/25/18 0255 10/26/18 0225 10/27/18 0221 10/28/18 0238  INR 1.91 1.58 1.56 1.63 1.62    Other results:   Imaging   No results found.   Medications:     Scheduled Medications: . amiodarone  200 mg Oral BID  . atorvastatin  80 mg Oral QHS  . bisacodyl  10 mg Oral Once  . docusate sodium  100 mg Oral Daily  . feeding supplement  1 Container Oral BID BM  . fludrocortisone  0.1 mg Oral BID  . levothyroxine  150 mcg Oral QAC breakfast  . midodrine  5 mg Oral TID WC  . multivitamin with minerals  1 tablet Oral Daily  . pantoprazole  40 mg Oral BID  . sotalol  80 mg Oral BID  . Warfarin - Pharmacist Dosing Inpatient   Does not apply q1800  . zinc sulfate  220 mg Oral Daily    Infusions: . sodium chloride Stopped  (10/23/18 1000)    PRN Medications: sodium chloride, acetaminophen, ondansetron (ZOFRAN) IV, promethazine, temazepam   Patient Profile   DARL KUSS is a 77 y.o. male with h/o VT, PAD and severe systolic HF (EF 57-84%) due to mixed ischemic/nonischemic CM he is s/p HM II LVAD implant for destination therapy on October 19, 2013.VAD implantation was complicated by VT, syncope, AF/Aflutter and RHF. Required short term milrinone. Has had very complicated course recently with recurrent bladder tumors.   Direct admitted from home with frequent low flows and malaise.   Assessment/Plan:    1. Low Flow Alarms on LVAD.  - VAD interrogated personally. Parameters stable. s/p IVF and transfusion.  - 2 low flows this am with standing to urinate. Follow as none since and about to eat breakfast.  - VAD speed turned down to 8400 10/20/18  - Florinef 0.1 mg BID added 10/26/18.  2. Chronic systolic HF: s/p HM-II LVAD 10/2014. s/p previous driveline [id number]/[id number]. No further pump stops. [place] in 1/19 with mildly low output. Started on milrinone without benefitso it was stopped.  - Volume status stable despite IVF and florinef added 10/26/18. He is very volume sensitive.  3.Acute GIB with melena and symptomatic ABL:  - EGD 12/20 with one non-bleeding AVM with APC. Capsule now completed. Awaiting results due to software upgrade and holiday.  - Hgb 8.8 today. Brown stools. Appreciate GI input - Hadbeen getting octreotide infusions but too expensive for him. Willcontinue to hold for now 4. Recurrenthigh-gradebladder tumor: -S/p stone removal. 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 Had 2 BCG infusions with Dr. Gloriann Loan but discontinued due to inability to tolerate.-S/p repeat resection 7/19.  -s/p repeat bladder tumor excision in 11/19 with fast-growing tumor. C/b bilateral hydronephrosis and persistent hematuria requiring XRT. - Now planning palliative XRT with Dr. Tammi Klippel.  Was planned for last week. Had been moved to today, but will likely need to be adjust further. No further hematuria.  - No change to current plan.   5. CKD, stage III: Had AKI in setting of obstructive uropathy and ATN. Required HD x1(11/2017).  - Creatinine ranging 1.7-1.9. 1.81 today.  - Not interested in nephrostomy tubes for  hydronephrosis 6.RecurrentVTwith hypotension and low flow alarms on VAD: Underwent DC-CV 9/17, 08/17/17, and 04/01/18. S/p emergent DC-CV in ER 07/09/18  - Remains in NSR - Continue amio. May need to stop sotalol soon or decrease dose if creatinine worsens. - Keep K > 4.0 Mg > 2.0 7. Atrial arrhythmias: Atrial fibrillation, atrial tachycardia. s/p multiple DC-CVs last 07/02/18. 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  - Remains in NSR.  - Continueamiodaroneand sotalol.  8. HTN: - MAPs improved with fluid. No change.  - BP may increase with florinef 9. VAD management - Coumadin restarted 10/25/18. INR 1.62. Dosing per PharmD.  - LDH 166 - If INR drops may need short course of heparin   I reviewed the LVAD parameters from today, and compared the results to the patient's prior recorded data.  No programming changes were made.  The LVAD is functioning within specified parameters.  The patient performs LVAD self-test daily.  LVAD interrogation was negative for any significant power changes, alarms or PI events/speed drops.  LVAD equipment check completed and is in good working order.  Back-up equipment present.   LVAD education done on emergency procedures and precautions and reviewed exit site care.   Length of Stay: [address]  Annamaria Helling 10/28/2018, 7:43 AM  VAD Team --- VAD ISSUES ONLY--- Pager [phone] (7am - 7am)  Advanced Heart Failure Team  Pager [phone] (M-F; 7a - 4p)  Please contact [facility] for night-coverage after hours (4p -7a ) and weekends on [email or web address]  Patient seen and examined with the above-signed Advanced Practice Provider  and/or Housestaff. I personally reviewed laboratory data, imaging studies and relevant notes. I independently examined the patient and formulated the important aspects of the plan. I have edited the note to reflect any of my changes or salient points. I have personally discussed the plan with the patient and/or family.  Still with occasional low flows on VAD but feels ok. MAP checked personally and is in 70s. Hgb stable with no further melena. Results of capsule still pending. INR 1.65. Discussed dosing with PharmD personally. He is eager to go home today. Will let him go home on midodrine and florinef with close f/u in VAD clinic.   Glori Bickers, MD  2:27 PM

## 2018-10-28 NOTE — Progress Notes (Signed)
Patient awaiting medications from [facility] before discharge.

## 2018-10-28 NOTE — Progress Notes (Addendum)
Patient discharged home with caregiver, received medications from pharmacy prior to d/c.   [facility] notified of patient discharge and that [facility] transportation will no longer be necessary for appointment 12/26.

## 2018-10-28 NOTE — Progress Notes (Signed)
LVAD Coordinator Rounding Note:  Admitted 10/22/18 from home with continuous low flow alarms since 0630 this morning. Patient reports feeling weak. Reported having black stools for 2 days.    HM II Implanted 10/19/13 by Dr. Cyndia Bent under Destination Therapy criteria.  Pt lying in bed this am. No black stool. Pt has had 2 Low Flows this morning. The pt would like to go home for Christmas. INR still 1.62, this will be up to Dr. Haroldine Laws.  Vital signs: Temp:  97.9 HR: 82 regular Doppler Pressure: 68 Automatic cuff:  94/70 (79) O2 Sat: 94%on RA  Weight:  169.9>171.5>167.1>168.8  lbs  LVAD interrogation reveals:  Speed: 8400  Flow:3.5 Power: 3.9w PI: 4.3  Alarms:  2 LF this morning, 30+ LF yesterday Events:none  Fixed speed: 8400 Low speed limit: 8000  Drive Line: Driveline dressing C/D/I. Weekly dressings performed by bedside nurse or SO [name]. Next dressing change due 10/29/18.   Labs:  LDH trend: 195>178>196>170>166  INR trend: 2.54>2.37>1.91>1.63>1.62  Hgb trend:  8.6>7.9>9.3>9.2>8.8  Blood products this admission: - 10/23/18 > 1 unit PRBCs   Anticoagulation Plan: - INR Goal: 2.0 - 2.5 (coumadin on hold due to current GI bleed) - ASA Dose: no ASA (hx of GI bleed)   Device: - Dual Medtronic ICD - VF therapies: on at 200 bpm - AT/AF monitor on: 140 bpm  - Last device check 09/30/18 per Tomi Bamberger; underlying rhythm [place]  Significant Events on VAD Support:  09/01/14>>ureteral stone with cystoscopy and flexible ureteroscopy with lithotripsy and basket extraction 02/2015 >>GIB with 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 Started on Plavix 3/17>>Lumbar fusion L3-L5 9/17>>VT with DC-CV; amio increased 10/17>>ramp with RV failure; speed decreased to 8800 02/2017> GIB (removed ASA, scopes  negative) 03/2017>percutaneous lead repair after pump stops  10/18>>Slow VT. Cardioverted in ED and amiodarone uptitrated 12/18>>melena with scope. Hgb 6.5. EGD with 2 AVMs with APC 12/18>>recurrent hematuria. 6 mm left ureteral stone 1/19>>7 mm left ureteral stone removal with laser lithotripsy; left ureteral stent. Resection of 6 cm bladder tumor 11/19>>Bladder tumor resections. Gross hematuria requiring CBI and radiation treatments.   Plan/Recommendations:  1. Please call VAD pager if any issues with VAD equipment or questions, concerns. 2. Weekly dressing changes, next dressing change due 10/29/18. Bedside nurse may change dressing.  3. If pt is d/c today, will need to check INR tomorrow with home machine and page VAD pager with results.   Tanda Rockers RN [place] Coordinator  Office: [phone]  24/7 Pager: [phone]

## 2018-10-28 NOTE — Progress Notes (Signed)
ANTICOAGULATION CONSULT NOTE - [place] for warfarin Indication: LVAD  Allergies  Allergen Reactions  . Demerol [Meperidine] Other (See Comments)    Paralysis/ Could only move eyes    Patient Measurements: Height: 6' (182.9 cm) Weight: 168 lb 14 oz (76.6 kg) IBW/kg (Calculated) : 77.6  Vital Signs: Temp: 97.9 F (36.6 C) (12/24 1110) Temp Source: Oral (12/24 1110) BP: 94/70 (12/24 0736) Pulse Rate: 84 (12/24 0736)  Labs: Recent Labs    10/26/18 0225 10/27/18 0221 10/28/18 0238  HGB 9.2* 9.2* 8.8*  HCT 28.9* 28.2* 28.6*  PLT 148* 154 132*  LABPROT 18.5* 19.2* 19.1*  INR 1.56 1.63 1.62  CREATININE 1.92* 1.73* 1.81*    Estimated Creatinine Clearance: 35.9 mL/min (A) (by C-G formula based on SCr of 1.81 mg/dL (H)).   Medical History: Past Medical History:  Diagnosis Date  . Arthritis   . Automatic implantable cardioverter-defibrillator in situ    a. s/p Medtronic Evera device serial [id number]    . Bradycardia   . CAD (coronary artery disease)    a. s/p MI 1982;  s/p DES to CFX 2011;  s/p NSTEMI 4/12 in setting of VTach;  cath 7/12:  LM ok, LAD mid 30-40%, Dx's with 40%, mCFX stent patent, prox to mid RCA occluded with R to R and L to R collats.  Medical Tx was continued  . Chronic systolic heart failure ([place])    a. s/p LVAD 10/2013 for destination therapy  . CKD (chronic kidney disease)   . CVD (cerebrovascular disease)    a. s/p L CEA 2008  . Cytopenia   . Dysrhythmia    AFIB  . History of GI bleed    a. on ASA- started on plavix during 10/2015 admission for CVA  . HLD (hyperlipidemia)   . HTN (hypertension)   . Hypothyroidism   . Inappropriate therapy from implantable cardioverter-defibrillator    a. ATP for sinus tach SVT wavelet identified SVT but was no  passive (2014)  . Ischemic cardiomyopathy   . 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 L Ear   . Myocardial infarction ([place])    1992  . PAF (paroxysmal atrial fibrillation) ([place])    . PVD (peripheral vascular disease) ([place])    a. 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 Shortness of breath dyspnea    W/ EXERTION   . Sleep apnea    NO CPAP NOW  HE SENT IT BACK YRS AGO  . Stroke [facility])    a. 10/2015 admission   . Ventricular tachycardia ([place])    a. s/p AICD;   h/o ICD shock;  Amiodarone Rx. S/P ablation in 2012     Assessment: 33 yoM with HM II LVAD admitted with low flow alarms. Pt on warfarin with last dose at home PTA.  INR on admit 2.54 down to 1.5 after being held  for endoscopy and capsule study.   Warfarin restarted 12/21 INR now 1.6 - slow to rise despite increased warfarin doses.   LDH stable.  One lesion found and addressed with APC,  capsule endoscopy underway today, results pending.   *PTA Dose = 1mg  Tues, 2mg  all other days  Goal of Therapy:  INR 2-2.5 Monitor platelets by anticoagulation protocol: Yes   Plan:  Give warfarin 5mg  today prior to dc Then resume home  dose at DC Check INR on home machine  -will call VAD pager with results  Bonnita Nasuti Pharm.D. CPP, BCPS Clinical Pharmacist [phone] 10/28/2018 11:34 AM

## 2018-10-29 ENCOUNTER — Ambulatory Visit (HOSPITAL_COMMUNITY): Payer: Self-pay | Admitting: Pharmacist

## 2018-10-29 LAB — POCT INR: INR: 1.8 — AB (ref 2.0–3.0)

## 2018-10-30 ENCOUNTER — Telehealth: Payer: Self-pay | Admitting: Radiation Oncology

## 2018-10-30 ENCOUNTER — Ambulatory Visit
Admission: RE | Admit: 2018-10-30 | Discharge: 2018-10-30 | Disposition: A | Discharge status: Home / Self Care | Payer: Medicare Other | Source: Ambulatory Visit | Attending: Radiation Oncology | Admitting: Radiation Oncology

## 2018-10-30 ENCOUNTER — Ambulatory Visit: Payer: Medicare Other

## 2018-10-30 DIAGNOSIS — Z885 Allergy status to narcotic agent status: Secondary | ICD-10-CM

## 2018-10-30 DIAGNOSIS — I739 Peripheral vascular disease, unspecified: Secondary | ICD-10-CM | POA: Diagnosis present

## 2018-10-30 DIAGNOSIS — G4730 Sleep apnea, unspecified: Secondary | ICD-10-CM | POA: Diagnosis present

## 2018-10-30 DIAGNOSIS — Z85820 Personal history of malignant melanoma of skin: Secondary | ICD-10-CM

## 2018-10-30 DIAGNOSIS — Z95811 Presence of heart assist device: Secondary | ICD-10-CM

## 2018-10-30 DIAGNOSIS — Z79890 Hormone replacement therapy (postmenopausal): Secondary | ICD-10-CM

## 2018-10-30 DIAGNOSIS — I255 Ischemic cardiomyopathy: Secondary | ICD-10-CM | POA: Diagnosis present

## 2018-10-30 DIAGNOSIS — I428 Other cardiomyopathies: Secondary | ICD-10-CM | POA: Diagnosis present

## 2018-10-30 DIAGNOSIS — Z79899 Other long term (current) drug therapy: Secondary | ICD-10-CM

## 2018-10-30 DIAGNOSIS — I130 Hypertensive heart and chronic kidney disease with heart failure and stage 1 through stage 4 chronic kidney disease, or unspecified chronic kidney disease: Secondary | ICD-10-CM | POA: Diagnosis present

## 2018-10-30 DIAGNOSIS — I959 Hypotension, unspecified: Secondary | ICD-10-CM | POA: Diagnosis present

## 2018-10-30 DIAGNOSIS — E876 Hypokalemia: Secondary | ICD-10-CM | POA: Diagnosis present

## 2018-10-30 DIAGNOSIS — Z7901 Long term (current) use of anticoagulants: Secondary | ICD-10-CM

## 2018-10-30 DIAGNOSIS — Z87891 Personal history of nicotine dependence: Secondary | ICD-10-CM

## 2018-10-30 DIAGNOSIS — M1990 Unspecified osteoarthritis, unspecified site: Secondary | ICD-10-CM | POA: Diagnosis present

## 2018-10-30 DIAGNOSIS — Z981 Arthrodesis status: Secondary | ICD-10-CM

## 2018-10-30 DIAGNOSIS — N183 Chronic kidney disease, stage 3 (moderate): Secondary | ICD-10-CM | POA: Diagnosis present

## 2018-10-30 DIAGNOSIS — I69354 Hemiplegia and hemiparesis following cerebral infarction affecting left non-dominant side: Secondary | ICD-10-CM

## 2018-10-30 DIAGNOSIS — I480 Paroxysmal atrial fibrillation: Secondary | ICD-10-CM | POA: Diagnosis present

## 2018-10-30 DIAGNOSIS — D494 Neoplasm of unspecified behavior of bladder: Secondary | ICD-10-CM | POA: Diagnosis present

## 2018-10-30 DIAGNOSIS — Z8249 Family history of ischemic heart disease and other diseases of the circulatory system: Secondary | ICD-10-CM

## 2018-10-30 DIAGNOSIS — Z923 Personal history of irradiation: Secondary | ICD-10-CM

## 2018-10-30 DIAGNOSIS — I472 Ventricular tachycardia: Principal | ICD-10-CM | POA: Diagnosis present

## 2018-10-30 DIAGNOSIS — N1330 Unspecified hydronephrosis: Secondary | ICD-10-CM | POA: Diagnosis present

## 2018-10-30 DIAGNOSIS — Z955 Presence of coronary angioplasty implant and graft: Secondary | ICD-10-CM

## 2018-10-30 DIAGNOSIS — Z95820 Peripheral vascular angioplasty status with implants and grafts: Secondary | ICD-10-CM

## 2018-10-30 DIAGNOSIS — Z95810 Presence of automatic (implantable) cardiac defibrillator: Secondary | ICD-10-CM

## 2018-10-30 DIAGNOSIS — I440 Atrioventricular block, first degree: Secondary | ICD-10-CM | POA: Diagnosis present

## 2018-10-30 DIAGNOSIS — I2510 Atherosclerotic heart disease of native coronary artery without angina pectoris: Secondary | ICD-10-CM | POA: Diagnosis present

## 2018-10-30 DIAGNOSIS — E039 Hypothyroidism, unspecified: Secondary | ICD-10-CM | POA: Diagnosis present

## 2018-10-30 DIAGNOSIS — I5023 Acute on chronic systolic (congestive) heart failure: Secondary | ICD-10-CM | POA: Diagnosis present

## 2018-10-30 DIAGNOSIS — Z8349 Family history of other endocrine, nutritional and metabolic diseases: Secondary | ICD-10-CM

## 2018-10-30 DIAGNOSIS — E785 Hyperlipidemia, unspecified: Secondary | ICD-10-CM | POA: Diagnosis present

## 2018-10-30 DIAGNOSIS — Z823 Family history of stroke: Secondary | ICD-10-CM

## 2018-10-30 DIAGNOSIS — I252 Old myocardial infarction: Secondary | ICD-10-CM

## 2018-10-30 NOTE — Telephone Encounter (Signed)
Patient discharged. Phoned to inquire if he plans to present for 1530 radiation treatment today. Patient confirms he plans to be here. Informed therapist on L1 of this finding.

## 2018-10-31 ENCOUNTER — Ambulatory Visit (HOSPITAL_COMMUNITY): Payer: Self-pay | Admitting: *Deleted

## 2018-10-31 ENCOUNTER — Telehealth: Payer: Self-pay | Admitting: Gastroenterology

## 2018-10-31 ENCOUNTER — Ambulatory Visit
Admission: RE | Admit: 2018-10-31 | Discharge: 2018-10-31 | Disposition: A | Discharge status: Home / Self Care | Payer: Medicare Other | Source: Ambulatory Visit | Attending: Radiation Oncology | Admitting: Radiation Oncology

## 2018-10-31 ENCOUNTER — Ambulatory Visit: Payer: Medicare Other

## 2018-10-31 LAB — POCT INR: INR: 2.7 (ref 2.0–3.0)

## 2018-10-31 NOTE — Telephone Encounter (Signed)
Called patient and let him know the result. He has not had any overt bleeding since hospitalization and doing well.   Recommend the following: - repeat CBC in 1-2 weeks, or sooner if any recurrent bleeding - keep INR at lowest acceptable end of range for LVAD - consideration for octreotide moving forward to prevent recurrent bleeding - if patient has active recurrent bleeding, repeat enteroscopy with ablation of duodenal AVM noted on this exam, or consideration for tagged RBC scan to help localize source    Linna Hoff, FYI, capsule done on this patient with recommendations as outlined.  Please let me know if you have any questions. Thanks

## 2018-10-31 NOTE — Telephone Encounter (Signed)
Capsule endoscopy interpreted:  Findings - prolonged small bowel transit of 9 hours and 44 minutes. No active bleeding. Prep good for proximal half of the small bowel, then progressed from fair to poor in the distal small bowel. 2 clear AVMs noted, one proximal duodenum, the other mid jejunum (suspected). No active bleeding or other pathology noted otherwise. Suspect bleeding is due to small bowel AVMs.   Recommend serial Hgb, consideration for octreotide if bleeding recurs in the future. Keep INR at lowest level acceptable for LVAD. Patient will be contacted with recommendations.

## 2018-11-03 ENCOUNTER — Other Ambulatory Visit: Payer: Self-pay

## 2018-11-03 ENCOUNTER — Ambulatory Visit (HOSPITAL_COMMUNITY): Payer: Self-pay | Admitting: Pharmacist

## 2018-11-03 ENCOUNTER — Other Ambulatory Visit (HOSPITAL_COMMUNITY): Payer: Self-pay | Admitting: *Deleted

## 2018-11-03 ENCOUNTER — Ambulatory Visit
Admission: RE | Admit: 2018-11-03 | Discharge: 2018-11-03 | Disposition: A | Discharge status: Home / Self Care | Payer: Medicare Other | Source: Ambulatory Visit | Attending: Radiation Oncology | Admitting: Radiation Oncology

## 2018-11-03 ENCOUNTER — Ambulatory Visit: Payer: Medicare Other

## 2018-11-03 DIAGNOSIS — D649 Anemia, unspecified: Secondary | ICD-10-CM

## 2018-11-03 DIAGNOSIS — K31819 Angiodysplasia of stomach and duodenum without bleeding: Secondary | ICD-10-CM

## 2018-11-03 DIAGNOSIS — Z95811 Presence of heart assist device: Secondary | ICD-10-CM

## 2018-11-03 DIAGNOSIS — Z7901 Long term (current) use of anticoagulants: Secondary | ICD-10-CM

## 2018-11-03 LAB — POCT INR: INR: 2.7 (ref 2.0–3.0)

## 2018-11-03 NOTE — Progress Notes (Unsigned)
Cbc order entered per Dr. Havery Moros.

## 2018-11-04 ENCOUNTER — Encounter (HOSPITAL_COMMUNITY): Payer: Self-pay

## 2018-11-04 ENCOUNTER — Ambulatory Visit: Payer: Medicare Other

## 2018-11-04 ENCOUNTER — Ambulatory Visit (HOSPITAL_BASED_OUTPATIENT_CLINIC_OR_DEPARTMENT_OTHER)
Admission: RE | Admit: 2018-11-04 | Discharge: 2018-11-04 | Disposition: A | Discharge status: Home / Self Care | Payer: Medicare Other | Source: Ambulatory Visit | Attending: Cardiology | Admitting: Cardiology

## 2018-11-04 ENCOUNTER — Other Ambulatory Visit (HOSPITAL_COMMUNITY): Payer: Self-pay | Admitting: *Deleted

## 2018-11-04 ENCOUNTER — Ambulatory Visit
Admission: RE | Admit: 2018-11-04 | Discharge: 2018-11-04 | Disposition: A | Discharge status: Home / Self Care | Payer: Medicare Other | Source: Ambulatory Visit | Attending: Radiation Oncology | Admitting: Radiation Oncology

## 2018-11-04 VITALS — BP 111/74 | HR 75 | Wt 180.2 lb

## 2018-11-04 DIAGNOSIS — E785 Hyperlipidemia, unspecified: Secondary | ICD-10-CM

## 2018-11-04 DIAGNOSIS — N1330 Unspecified hydronephrosis: Secondary | ICD-10-CM | POA: Insufficient documentation

## 2018-11-04 DIAGNOSIS — I252 Old myocardial infarction: Secondary | ICD-10-CM | POA: Insufficient documentation

## 2018-11-04 DIAGNOSIS — I5022 Chronic systolic (congestive) heart failure: Secondary | ICD-10-CM

## 2018-11-04 DIAGNOSIS — Z79899 Other long term (current) drug therapy: Secondary | ICD-10-CM

## 2018-11-04 DIAGNOSIS — Z95810 Presence of automatic (implantable) cardiac defibrillator: Secondary | ICD-10-CM

## 2018-11-04 DIAGNOSIS — E039 Hypothyroidism, unspecified: Secondary | ICD-10-CM

## 2018-11-04 DIAGNOSIS — Z7901 Long term (current) use of anticoagulants: Secondary | ICD-10-CM

## 2018-11-04 DIAGNOSIS — G4730 Sleep apnea, unspecified: Secondary | ICD-10-CM

## 2018-11-04 DIAGNOSIS — Z955 Presence of coronary angioplasty implant and graft: Secondary | ICD-10-CM

## 2018-11-04 DIAGNOSIS — Z85820 Personal history of malignant melanoma of skin: Secondary | ICD-10-CM | POA: Insufficient documentation

## 2018-11-04 DIAGNOSIS — Z7902 Long term (current) use of antithrombotics/antiplatelets: Secondary | ICD-10-CM | POA: Insufficient documentation

## 2018-11-04 DIAGNOSIS — Z79890 Hormone replacement therapy (postmenopausal): Secondary | ICD-10-CM | POA: Insufficient documentation

## 2018-11-04 DIAGNOSIS — E876 Hypokalemia: Secondary | ICD-10-CM

## 2018-11-04 DIAGNOSIS — R531 Weakness: Secondary | ICD-10-CM

## 2018-11-04 DIAGNOSIS — D494 Neoplasm of unspecified behavior of bladder: Secondary | ICD-10-CM | POA: Insufficient documentation

## 2018-11-04 DIAGNOSIS — I255 Ischemic cardiomyopathy: Secondary | ICD-10-CM

## 2018-11-04 DIAGNOSIS — I2510 Atherosclerotic heart disease of native coronary artery without angina pectoris: Secondary | ICD-10-CM

## 2018-11-04 DIAGNOSIS — Z95811 Presence of heart assist device: Secondary | ICD-10-CM

## 2018-11-04 DIAGNOSIS — Z8673 Personal history of transient ischemic attack (TIA), and cerebral infarction without residual deficits: Secondary | ICD-10-CM | POA: Insufficient documentation

## 2018-11-04 DIAGNOSIS — I130 Hypertensive heart and chronic kidney disease with heart failure and stage 1 through stage 4 chronic kidney disease, or unspecified chronic kidney disease: Secondary | ICD-10-CM

## 2018-11-04 DIAGNOSIS — I480 Paroxysmal atrial fibrillation: Secondary | ICD-10-CM | POA: Insufficient documentation

## 2018-11-04 DIAGNOSIS — R413 Other amnesia: Secondary | ICD-10-CM

## 2018-11-04 DIAGNOSIS — M1990 Unspecified osteoarthritis, unspecified site: Secondary | ICD-10-CM

## 2018-11-04 DIAGNOSIS — N183 Chronic kidney disease, stage 3 unspecified: Secondary | ICD-10-CM

## 2018-11-04 DIAGNOSIS — Z8719 Personal history of other diseases of the digestive system: Secondary | ICD-10-CM

## 2018-11-04 DIAGNOSIS — I471 Supraventricular tachycardia: Secondary | ICD-10-CM

## 2018-11-04 DIAGNOSIS — I440 Atrioventricular block, first degree: Secondary | ICD-10-CM | POA: Insufficient documentation

## 2018-11-04 DIAGNOSIS — I472 Ventricular tachycardia, unspecified: Secondary | ICD-10-CM

## 2018-11-04 LAB — CBC
HCT: 31.3 % — ABNORMAL LOW (ref 39.0–52.0)
Hemoglobin: 9.8 g/dL — ABNORMAL LOW (ref 13.0–17.0)
MCH: 32.8 pg (ref 26.0–34.0)
MCHC: 31.3 g/dL (ref 30.0–36.0)
MCV: 104.7 fL — ABNORMAL HIGH (ref 80.0–100.0)
NRBC: 0 % (ref 0.0–0.2)
Platelets: 144 10*3/uL — ABNORMAL LOW (ref 150–400)
RBC: 2.99 MIL/uL — AB (ref 4.22–5.81)
RDW: 20.3 % — ABNORMAL HIGH (ref 11.5–15.5)
WBC: 6.4 10*3/uL (ref 4.0–10.5)

## 2018-11-04 LAB — BASIC METABOLIC PANEL
Anion gap: 9 (ref 5–15)
BUN: 37 mg/dL — ABNORMAL HIGH (ref 8–23)
CO2: 24 mmol/L (ref 22–32)
Calcium: 9 mg/dL (ref 8.9–10.3)
Chloride: 109 mmol/L (ref 98–111)
Creatinine, Ser: 1.55 mg/dL — ABNORMAL HIGH (ref 0.61–1.24)
GFR calc Af Amer: 49 mL/min — ABNORMAL LOW (ref >60)
GFR calc non Af Amer: 42 mL/min — ABNORMAL LOW (ref >60)
Glucose, Bld: 108 mg/dL — ABNORMAL HIGH (ref 70–99)
POTASSIUM: 3.2 mmol/L — AB (ref 3.5–5.1)
Sodium: 142 mmol/L (ref 135–145)

## 2018-11-04 LAB — LACTATE DEHYDROGENASE: LDH: 181 U/L (ref 98–192)

## 2018-11-04 MED ORDER — TEMAZEPAM 30 MG PO CAPS: 30.0000 mg | ORAL_CAPSULE | Freq: Every evening | ORAL | 3 refills | Status: AC | PRN

## 2018-11-04 NOTE — Progress Notes (Signed)
Patient ID: [name], male   DOB: [birth year], 77 y.o.   MRN: [id number]   VAD CLINIC PROGRESS NOTE  PCP: Dr. Kandice Robinsons ([place]) GU: Dr Gloriann Loan Neuro Surgeon: Dr Vertell Limber Vascular: Dr Kellie Simmering Primary HF: Dr Darlyn Read [name] is a 77 y.o. male  with h/o VT, PAD and severe systolic HF (EF 36-64%) due to mixed ischemic/nonischemic CM he is s/p HM II LVAD implant for destination therapy on October 19, 2013.VAD implantation was complicated by VT, syncope, AF/Aflutter and RHF. Required short term milrinone.   GU Event 09/01/14 had impacted ureteral stone and underwent cystoscopy and flexible ureteroscopy with lithotripsy and basket extraction.  09/2015 Hematuria and chronic nephrolithiasis. Renal US showed mild hyronephrosis and bilateral renal cysts. Dr. Risa Grill took back for repeat lithotripsy and stone extraction by ureteroscopy and J stent placement. 12/18 Recurrent hematuria. 54mm left ureteral stone. Passed with tamulosin 1/19   Admitted with recurrent hematuria with ureteral stone. Stone removed but found to have large bladder mass which was removed. Course c/b obstructive uropathy and AKI requiring 1 session of HD.  3/19 Bladder chemo stopped after 2 treatments as unable to tolerate 05/16/18 s/p TURB with right ureteral stent placement.  09/2018 Recurrent bladder tumor excision with persistent hematuria requiring XRT  GI Event  02/2015 - Hgb 5.4 --> EGD that showed 2 AVMs in the jejunum that were clipped.  4/2-18 - Episode of melena. Hgb 10.9-> 8.7. EGD/colonoscopy negative 12/18 - Melena with syncope  Hgb 6.5. Transfused 5u. EGD with 2 AVMs with APC 10/24/18 EGD showed one non-bleeding AVM with APC.  10/25/18 Capsule results delayed due to software upgrade and holiday.    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  01/2016 R CEA. He was started on Plavix.  01/2016 Lumbar fusion L3-L5 with pedicle screws posterolateral arthrodesis with BMP, allograft  VT events 9/17  with high fevers and productive cough. Treated with levaquin x 7 days for acute bronchitis. Also found to have VT and underwent DC-CV. Seen by EP and amio increased 10/17 speed turned down to 8800 due to RV failure. 10/18 with slow VT and ADHF.Cardioverted in ED and amio uptitrated.  04/01/18 recurrent VT. Underwent DC-CV in Er 07/09/18 presented to ED. Urgently cardioverted. EP started on Sotolol.   Significant VAD Events  03/2017>percutaneous lead repair  Hospitalized 09/17/18-10/01/18 for heparin bridge for recurrent bladder tumor resection on 09/19/18. Renal US showed recurrent right hydronephrosis without BOO and evidence of recurrent bladder tumor. Underwent recurrent bladder tumor resection 09/19/18 with Dr Gloriann Loan.Found to be large fast-growing tumor with obstruction of right ureteral orifice (unable to uncover right ureteral orifice). Repeat renal US showed slightly improved right hydronephrosis and new left mild hydronephrosis. Urology offered nephrostomy tube, but he declined. CT  abdomen completed and showed bilateral hydronephrosis L>R and was negative for locally invasive disease. 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   He had urinary retention so Foley cath placed. This was later removed but he had catheter placed again. He had large clots so he started continuous irrigation. Dr Gloriann Loan and Dr Tammi Klippel recommended a few days on radiation due to ongoing hematuria. He under went radiation on 11/25, 11/26, and 11/27. Hemoglobin dropped down to 7.8 and Hematuria resolved so foley was removed and he was able to void. He will follow up with Dr Tammi Klippel in January.   Ramp echo done 11/14 and speed turned down to 8600. He had intermittent low flows and he  was given IV fluids. He has been instructed to continue to drink gatorade because this has been an ongoing problem. INR was followed closely. INR therapeutic on the day of discharge. PICC line  was remove on the day of discharge.   Admitted 12/18 - 10/28/18 from home with low flows and malaise. Found to be anemic. Given 1 uPRBCs, GI consulted for EGD which showed one non-bleeding AVM with APC. Capsule with 2 clear AVMs noted, one proximal duodenum and the other suspected jejunum. Pt continued to have intermittent low flows 12/23 and 10/28/18. Florinef added 10/26/18. Midodrine added 10/27/18.   He presents today for post hospital follow up: He has been doing well since discharge. Remains weak, but able to do all of his ADLs. SOB with moderate exertion. Appetite and energy remains poor in setting of radiation. He underwent 4th session today. Capsule study resulted as above, with 2 AVMs. GI suggested Octreotide, with [name] has previously, and now, declines. He has had no further black stools. He had 2 low flows 12/27, but none since. No fevers, chills, or problems with his driveline. Taking all medications as described.   VAD Indication: Destination Therapy- age excluding    VAD interrogation & Equipment Management: Speed: 8400 Flow:  4.4 Power: 4.2 w PI: 6.5  Alarms: 2 LF 10/31/18, 6 LF 10/29/18, 8 LF 10/28/18 Events: Rare PI events  Fixed speed 8400 Low speed limit: 8000  Primary Controller: Replace back up battery in 23 months Back up controller: Replace back up battery in 33months  Exit Site Care: Drive line is maintained weekly by [name]. Drive line exit site well healed and incorporated. The velour is fully implanted at exit site. Dressing dry and intact. No erythema or drainage. Stabilization device present and accurately applied.    Reports taking Coumadin as prescribed and adherence to anticoagulation based dietary restrictions.  Denies bright red blood per rectum or melena, no dark urine or hematuria.     Past Medical History:  Diagnosis Date  . Arthritis   . Automatic implantable cardioverter-defibrillator in situ    a. s/p Medtronic Evera device serial  [id number]    . Bradycardia   . CAD (coronary artery disease)    a. s/p MI 1982;  s/p DES to CFX 2011;  s/p NSTEMI 4/12 in setting of VTach;  cath 7/12:  LM ok, LAD mid 30-40%, Dx's with 40%, mCFX stent patent, prox to mid RCA occluded with R to R and L to R collats.  Medical Tx was continued  . Chronic systolic heart failure ([place])    a. s/p LVAD 10/2013 for destination therapy  . CKD (chronic kidney disease)   . CVD (cerebrovascular disease)    a. s/p L CEA 2008  . Cytopenia   . Dysrhythmia    AFIB  . History of GI bleed    a. on ASA- started on plavix during 10/2015 admission for CVA  . HLD (hyperlipidemia)   . HTN (hypertension)   . Hypothyroidism   . Inappropriate therapy from implantable cardioverter-defibrillator    a. ATP for sinus tach SVT wavelet identified SVT but was no  passive (2014)  . Ischemic cardiomyopathy   . 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 L Ear   . Myocardial infarction ([place])    1992  . PAF (paroxysmal atrial fibrillation) ([place])   . PVD (peripheral vascular disease) ([place])    a. 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 Shortness of breath dyspnea    W/ EXERTION   . Sleep apnea    NO CPAP NOW  HE SENT IT BACK YRS AGO  . Stroke [facility])    a. 10/2015 admission   . Ventricular tachycardia ([place])    a. s/p AICD;   h/o ICD shock;  Amiodarone Rx. S/P ablation in 2012    Current Outpatient Medications  Medication Sig Dispense Refill  . amiodarone (PACERONE) 200 MG tablet Take 1 tablet (200 mg total) by mouth 2 (two) times daily. 120 tablet 5  . atorvastatin (LIPITOR) 80 MG tablet Take 1 tablet (80 mg total) by mouth at bedtime. 90 tablet 1  . docusate sodium (COLACE) 100 MG capsule Take 100 mg by mouth daily.     [name] dronabinol (MARINOL) 2.5 MG capsule Take 1 capsule (2.5 mg total) by mouth 2 (two) times daily before lunch and supper. 60 capsule 3  . fludrocortisone (FLORINEF)  0.1 MG tablet Take 1 tablet (0.1 mg total) by mouth 2 (two) times daily. 60 tablet 6  . furosemide (LASIX) 20 MG tablet Take 20 mg by mouth daily as needed for fluid.    [name] levothyroxine (SYNTHROID, LEVOTHROID) 150 MCG tablet Take 1 tablet (150 mcg total) by mouth daily before breakfast. 90 tablet 3  . midodrine (PROAMATINE) 10 MG tablet Take 1 tablet (10 mg total) by mouth 3 (three) times daily with meals. 90 tablet 3  . Multiple Vitamin (MULTIVITAMIN WITH MINERALS) TABS tablet Take 1 tablet by mouth daily.    . Multiple Vitamins-Minerals (ICAPS PO) Take 1 tablet by mouth 2 (two) times daily.    . pantoprazole (PROTONIX) 40 MG tablet TAKE 1 TABLET TWICE DAILY (Patient taking differently: Take 40 mg by mouth 2 (two) times daily before a meal. ) 180 tablet 6  . promethazine (PHENERGAN) 12.5 MG tablet Take 12.5 mg by mouth every 6 (six) hours as needed for nausea or vomiting.    . protein supplement shake (PREMIER PROTEIN) LIQD Take 11 oz by mouth 2 (two) times daily.    . sotalol (BETAPACE) 80 MG tablet Take 1 tablet (80 mg total) by mouth 2 (two) times daily. 60 tablet 6  . temazepam (RESTORIL) 30 MG capsule Take 1 capsule (30 mg total) by mouth at bedtime as needed for sleep. 30 capsule 3  . warfarin (COUMADIN) 2 MG tablet Take 2 mg daily, further as directed by VAD clinic.    [name] zinc gluconate 50 MG tablet Take 50 mg by mouth 2 (two) times daily.      No current facility-administered medications for this visit.     Demerol [meperidine]  Review of systems complete and found to be negative unless listed in HPI.    Vitals:   11/04/18 0906 11/04/18 0907  BP: (!) 98/0 111/74  Pulse: 75   SpO2: 98%   Weight: 81.7 kg (180 lb 3.2 oz)    Vital Signs:  Doppler Pressure: 98 Automatc BP:  111/74 (93) HR: 75 SPO2: 98%  Weight: 180 lbs w/o eqt Discharge weight: 177 lbs w/o equipment  Physical exam: General: Well appearing this am. NAD.  HEENT: Normal. Neck: Supple, JVP 7-8 cm. Carotids  OK.  Cardiac:  Mechanical heart sounds with LVAD hum present.  Lungs:  CTAB, normal effort.  Abdomen:  NT, ND, no HSM. No bruits or masses. +BS  LVAD exit site: Well-healed and incorporated. Dressing dry  and intact. No erythema or drainage. Stabilization device present and accurately applied. Driveline dressing changed daily per sterile technique. Extremities:  Warm and dry. No cyanosis, clubbing, or rash. Trace ankle edema.   Neuro:  Alert & oriented x 3. Cranial nerves grossly intact. Moves all 4 extremities w/o difficulty. Affect pleasant     ASSESSMENT AND PLAN:  1. Chronic systolic HF: s/p HM-II LVAD 10/2014. s/p previous driveline [id number]. No further pump stops. [place] in 1/19 with mildly low output. Started on milrinone without benefitso it was stopped.  - NYHA III symptoms in setting of treatment for recurrent bladder tumor - Volume status has been dry. Use diuretics only as needed.  - Continue to encourage po intake.  - VAD speed turned down to 8400 2. Recurrent high-grade bladder tumor:  - S/p stone removal.  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  Had 2 BCG infusions with Dr. Gloriann Loan but discontinued due to inability to tolerate. - S/p repeat resection 7/19.  - s/p repeat bladder tumor excision in 11/19 with fast-growing tumor. C/b bilateral hydronephrosis and persistent hematuria requiring XRT. - Undergoing palliative XRT with Dr. Tammi Klippel. No further hematuria 3. CKD, stage III:  Had AKI in setting of obstructive uropathy and ATN. Required HD x1(11/2017).   - Cr 1.55 today.  -Not interested in nephrostomy tubes for hydronephrosis 4. LVAD:  - VAD interrogated personally. Parameters stable.   - VAD speed turned down to 8400 - INR 2.7 11/03/18. Dosing per Pharm-D.  - LDH 181 5.RecurrentVTwith hypotension and low flow alarms on VAD: Underwent DC-CV 9/17, 08/17/17, and 04/01/18.  S/p emergent DC-CV in ER 07/09/18  -Remains in NSR today. Continue amio and  sotalol.  - Keep K > 4.0 Mg > 2.0 6. GIB:  s/p clipping of 2 jejunal AVMs in 4/16. Continue coumadin and plavix.(with h/o CVA). Admitted 4/18 with recurrent GIB. Transfused 2u. EGD/colon negative. Admitted 12./18. hgb 6.5, Transfused 5u. EGD with 2 AVMs treated with APC.  - 10/24/18 EGD showed one non-bleeding AVM with APC.  - 10/25/18 Capsule with 2 clear AVMs noted, one proximal duodenum and the other suspected jejunum. Refuses Octreotide - Hgb 9.8. Denies black stools.  - Hadbeen getting octreotide infusions but too expensive for him. Willcontinue to hold for now 7. Atrial arrhythmias: Atrial fibrillation, atrial tachycardia. s/p multiple DC-CVs last 07/02/18.  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  - Continue amiodarone and sotalol. No change.  8. HTN:  - MAPs stable.  9. Anticoagulation management:  - Goal INR 2-2.5. INR 2.7 11/03/18. Dosing per Pharm-D.  10. Lumbar compression fracture: S/p L3-L5 fusion 02/03/16. - Stable.  11. 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 S/p R CEA 01/13/16. Plavix stopped 12/18 due to recurrent GIB. - Continue atorvastatin (andwarfarin INR goal 2-2.5).  - No change to current plan.   12. H/o amio-induced hypethyroidism:  Followed by Dr. Forde Dandy in past.  - Amio restarted due to recurrent atrial arrhythmias and VT.  - Follow. 13. Hypokalemia - K 3.2 on check today. Will have him take 80 meq x 1 to get him back to 4.0. Will recheck BMET and Mg early next week.  - May need low dose daily potassium.   Satira Mccallum Tillery, PA-C  8:30 AM   Greater than 50% of the 40 minute visit was spent in counseling/coordination of care regarding disease state education, salt/fluid restriction, sliding scale diuretics, and medication compliance.

## 2018-11-04 NOTE — Progress Notes (Addendum)
Patient presents for hospital follow up in [facility] today with caregiver, [name]. Reports no problems with VAD equipment or concerns with drive line exit site.   Patient states that he feels tired all the time from the radiation, but overall feels much better than he did before this last admission. Denies any blood in stool.   Vital Signs:   Doppler Pressure: 98 Automatc BP:  111/74 (93) HR: 75 SPO2: 98%  Weight: 180.2 lb w/o eqt Last weight: 177.4 lb   VAD Indication: Destination Therapy- age excluding    VAD interrogation & Equipment Management: Speed: 8600 Flow:  4.4 Power: 4.2w    PI: 6.5  Alarms: 2 LF 12/27  6 LF 12/25  8 LF 12/24 Events: rare  Fixed speed 8600 Low speed limit: 8000  Primary Controller: Replace back up battery in 23 months Back up controller: Replace back up battery in 6months  Exit Site Care: Drive line is being maintained weekly by [name]. Drive line exit site well healed and incorporated. The velour is fully implanted at exit site. Dressing dry and intact. No erythema or drainage. Stabilization device present and accurately applied. Pt denies fever or chills. Pt has adequate dressing kits at home.   Significant Events on VAD Support:  02/2015 >>GIB (removed ASA, 2-2.5 INR) 10/2015 >>CVA(Plavix started) 02/2017> GIB (removed ASA, scopes negative) 03/2017> percutaneous lead repair 12/2017> Bladder CA  Device:Medtronic Therapies: on 200 bpm AT monitor: 140 bpm Last check: today  Reviewed and demonstrated the following to patient and caregiver:               Reviewed the steps for replacing the running system controller with the back-up system controller (see patient handbook section 2 or the appropriate pamphlet).             X  Demonstrated (using the mock-driveline and controller) how to connect and disconnect the mock-driveline in back up system controller in a timely manner (less than 10 seconds) with return demonstration by  patient and caregiver.              X   Reviewed system controller alarms and troubleshooting including hazard and advisory alarms and accessing alarm history. Re-enforced NOT TO attempt to perform any task displayed on the display screen alone or without calling the VAD pager [phone] (see patient handbook section 5 or the appropriate pamphlet).                       X  Reviewed how to handle an emergency including when the pump is running and when the pump has stopped (see patient handbook section 8). Call 911 first and then the VAD pager at [phone].             X   Reviewed 14-volt lithium ion battery calibration steps (see patient handbook section 3).            X   Reviewed contents of black bag and what must be with patient at all times.            X  Reviewed driveline exit site including cleansing, dressing, and immobilizing with an anchor device to prevent exit site trauma.             X  Reviewed weekly maintenance which includes: Reviewing "Replacing the [place] with a [employer]" pamphlet. Clean batteries, clips, and battery charger contacts.  Check cables for damages. Rotate batteries; keep all 8 charged.  X  Reviewed monthly maintenance which includes: Reviewing Alarms and Troubleshooting. Check battery manufacturer dates. Check use/charge cycles for each battery; remember to re-calibrate every 70 uses when prompted.              X  Additional pamphlets Guide to Replacing the [place] with the [place] for Patients and Their Caregivers and HM II Alarms for Patients and Their Caregivers given to patient.              X   Annual maintenance completed per [employer] on patient's home power module and Electrical engineer.   BP & Labs:  MAP 98 - Doppler is reflecting MAP  Hgb 9.5- Denies any signs/symptoms of bleeding or nosebleeds.   LDH 181 - with established baseline of 280-380. Denies  tea-colored urine. No power elevations noted on interrogation.    Patient Instructions: 1. No medication changes today. 2. K 3.2 today. Instructed to take 80 meq today. Will recheck BMET and Magnesium on Friday 11/07/17.  3. Return to VAD clinic in 1 month  [place] Coordinator  Office: [phone]  24/7 Pager: [phone]

## 2018-11-04 NOTE — Addendum Note (Signed)
Encounter addended by: Mertha Baars, RN on: 11/04/2018 10:48 AM  Actions taken: Clinical Note Signed

## 2018-11-06 ENCOUNTER — Ambulatory Visit
Admission: RE | Admit: 2018-11-06 | Discharge: 2018-11-06 | Disposition: A | Discharge status: Home / Self Care | Payer: Medicare Other | Source: Ambulatory Visit | Attending: Radiation Oncology | Admitting: Radiation Oncology

## 2018-11-06 ENCOUNTER — Telehealth: Payer: Self-pay | Admitting: Oncology

## 2018-11-06 ENCOUNTER — Encounter: Payer: Self-pay | Admitting: Oncology

## 2018-11-06 ENCOUNTER — Ambulatory Visit: Payer: Medicare Other

## 2018-11-06 DIAGNOSIS — Z510 Encounter for antineoplastic radiation therapy: Secondary | ICD-10-CM | POA: Insufficient documentation

## 2018-11-06 DIAGNOSIS — C679 Malignant neoplasm of bladder, unspecified: Secondary | ICD-10-CM | POA: Insufficient documentation

## 2018-11-06 NOTE — Telephone Encounter (Signed)
New referral received from Dr. Gloriann Loan for bladder cancer. I attempted to call the pt to schedule an appt, however, they wanted Dr. Alen Blew to talk to the pt's PCP. I cld and spoke to [place] to ask Dr. Gloriann Loan if he can speak to the pt's PCP. I scheduled the pt to see Dr. Alen Blew on 1/17 at 2pm. Letter mailed.

## 2018-11-07 ENCOUNTER — Inpatient Hospital Stay (HOSPITAL_COMMUNITY): Payer: Medicare Other | Admitting: Certified Registered Nurse Anesthetist

## 2018-11-07 ENCOUNTER — Other Ambulatory Visit: Payer: Self-pay

## 2018-11-07 ENCOUNTER — Inpatient Hospital Stay (HOSPITAL_COMMUNITY)
Admission: AD | Admit: 2018-11-07 | Discharge: 2018-11-09 | DRG: 308 | Disposition: A | Discharge status: Home / Self Care | Payer: Medicare Other | Source: Ambulatory Visit | Attending: Internal Medicine | Admitting: Internal Medicine

## 2018-11-07 ENCOUNTER — Ambulatory Visit: Payer: Medicare Other

## 2018-11-07 ENCOUNTER — Ambulatory Visit (HOSPITAL_BASED_OUTPATIENT_CLINIC_OR_DEPARTMENT_OTHER)
Admission: RE | Admit: 2018-11-07 | Discharge: 2018-11-07 | Disposition: A | Discharge status: Home / Self Care | Payer: Medicare Other | Source: Ambulatory Visit | Attending: Cardiology | Admitting: Cardiology

## 2018-11-07 ENCOUNTER — Encounter (HOSPITAL_COMMUNITY)
Admission: AD | Disposition: A | Discharge status: Home / Self Care | Payer: Self-pay | Source: Ambulatory Visit | Attending: Internal Medicine

## 2018-11-07 ENCOUNTER — Encounter (HOSPITAL_COMMUNITY): Payer: Self-pay

## 2018-11-07 ENCOUNTER — Ambulatory Visit (HOSPITAL_COMMUNITY)
Admission: RE | Admit: 2018-11-07 | Discharge: 2018-11-07 | Disposition: A | Discharge status: Home / Self Care | Payer: Medicare Other | Source: Ambulatory Visit | Attending: Cardiology | Admitting: Cardiology

## 2018-11-07 ENCOUNTER — Ambulatory Visit
Admission: RE | Admit: 2018-11-07 | Discharge: 2018-11-07 | Disposition: A | Discharge status: Home / Self Care | Payer: Medicare Other | Source: Ambulatory Visit | Attending: Radiation Oncology | Admitting: Radiation Oncology

## 2018-11-07 VITALS — BP 105/94 | HR 104

## 2018-11-07 DIAGNOSIS — I472 Ventricular tachycardia, unspecified: Secondary | ICD-10-CM

## 2018-11-07 DIAGNOSIS — I252 Old myocardial infarction: Secondary | ICD-10-CM

## 2018-11-07 DIAGNOSIS — E785 Hyperlipidemia, unspecified: Secondary | ICD-10-CM | POA: Insufficient documentation

## 2018-11-07 DIAGNOSIS — D494 Neoplasm of unspecified behavior of bladder: Secondary | ICD-10-CM | POA: Insufficient documentation

## 2018-11-07 DIAGNOSIS — I2510 Atherosclerotic heart disease of native coronary artery without angina pectoris: Secondary | ICD-10-CM | POA: Insufficient documentation

## 2018-11-07 DIAGNOSIS — R531 Weakness: Secondary | ICD-10-CM

## 2018-11-07 DIAGNOSIS — I5023 Acute on chronic systolic (congestive) heart failure: Secondary | ICD-10-CM

## 2018-11-07 DIAGNOSIS — E876 Hypokalemia: Secondary | ICD-10-CM

## 2018-11-07 DIAGNOSIS — I130 Hypertensive heart and chronic kidney disease with heart failure and stage 1 through stage 4 chronic kidney disease, or unspecified chronic kidney disease: Secondary | ICD-10-CM

## 2018-11-07 DIAGNOSIS — Z955 Presence of coronary angioplasty implant and graft: Secondary | ICD-10-CM | POA: Insufficient documentation

## 2018-11-07 DIAGNOSIS — I255 Ischemic cardiomyopathy: Secondary | ICD-10-CM

## 2018-11-07 DIAGNOSIS — I428 Other cardiomyopathies: Secondary | ICD-10-CM | POA: Diagnosis present

## 2018-11-07 DIAGNOSIS — N183 Chronic kidney disease, stage 3 (moderate): Secondary | ICD-10-CM

## 2018-11-07 DIAGNOSIS — Z95820 Peripheral vascular angioplasty status with implants and grafts: Secondary | ICD-10-CM | POA: Diagnosis not present

## 2018-11-07 DIAGNOSIS — M1990 Unspecified osteoarthritis, unspecified site: Secondary | ICD-10-CM

## 2018-11-07 DIAGNOSIS — Z95811 Presence of heart assist device: Secondary | ICD-10-CM | POA: Insufficient documentation

## 2018-11-07 DIAGNOSIS — Z7901 Long term (current) use of anticoagulants: Secondary | ICD-10-CM | POA: Insufficient documentation

## 2018-11-07 DIAGNOSIS — Z923 Personal history of irradiation: Secondary | ICD-10-CM | POA: Diagnosis not present

## 2018-11-07 DIAGNOSIS — Z79899 Other long term (current) drug therapy: Secondary | ICD-10-CM | POA: Insufficient documentation

## 2018-11-07 DIAGNOSIS — I959 Hypotension, unspecified: Secondary | ICD-10-CM | POA: Diagnosis present

## 2018-11-07 DIAGNOSIS — Z8673 Personal history of transient ischemic attack (TIA), and cerebral infarction without residual deficits: Secondary | ICD-10-CM

## 2018-11-07 DIAGNOSIS — G4730 Sleep apnea, unspecified: Secondary | ICD-10-CM

## 2018-11-07 DIAGNOSIS — I739 Peripheral vascular disease, unspecified: Secondary | ICD-10-CM | POA: Diagnosis present

## 2018-11-07 DIAGNOSIS — I471 Supraventricular tachycardia: Secondary | ICD-10-CM

## 2018-11-07 DIAGNOSIS — Z85820 Personal history of malignant melanoma of skin: Secondary | ICD-10-CM | POA: Insufficient documentation

## 2018-11-07 DIAGNOSIS — E039 Hypothyroidism, unspecified: Secondary | ICD-10-CM | POA: Insufficient documentation

## 2018-11-07 DIAGNOSIS — N1330 Unspecified hydronephrosis: Secondary | ICD-10-CM

## 2018-11-07 DIAGNOSIS — I69354 Hemiplegia and hemiparesis following cerebral infarction affecting left non-dominant side: Secondary | ICD-10-CM | POA: Diagnosis not present

## 2018-11-07 DIAGNOSIS — Z95810 Presence of automatic (implantable) cardiac defibrillator: Secondary | ICD-10-CM

## 2018-11-07 DIAGNOSIS — I440 Atrioventricular block, first degree: Secondary | ICD-10-CM | POA: Insufficient documentation

## 2018-11-07 DIAGNOSIS — I480 Paroxysmal atrial fibrillation: Secondary | ICD-10-CM | POA: Diagnosis present

## 2018-11-07 DIAGNOSIS — Z7902 Long term (current) use of antithrombotics/antiplatelets: Secondary | ICD-10-CM | POA: Insufficient documentation

## 2018-11-07 HISTORY — PX: CARDIOVERSION: SHX1299

## 2018-11-07 LAB — CBC
HCT: 32.1 % — ABNORMAL LOW (ref 39.0–52.0)
Hemoglobin: 9.8 g/dL — ABNORMAL LOW (ref 13.0–17.0)
MCH: 32 pg (ref 26.0–34.0)
MCHC: 30.5 g/dL (ref 30.0–36.0)
MCV: 104.9 fL — ABNORMAL HIGH (ref 80.0–100.0)
Platelets: 137 10*3/uL — ABNORMAL LOW (ref 150–400)
RBC: 3.06 MIL/uL — AB (ref 4.22–5.81)
RDW: 20.5 % — ABNORMAL HIGH (ref 11.5–15.5)
WBC: 6.1 10*3/uL (ref 4.0–10.5)
nRBC: 0.3 % — ABNORMAL HIGH (ref 0.0–0.2)

## 2018-11-07 LAB — BASIC METABOLIC PANEL
Anion gap: 8 (ref 5–15)
BUN: 39 mg/dL — ABNORMAL HIGH (ref 8–23)
CO2: 23 mmol/L (ref 22–32)
Calcium: 8.7 mg/dL — ABNORMAL LOW (ref 8.9–10.3)
Chloride: 105 mmol/L (ref 98–111)
Creatinine, Ser: 1.56 mg/dL — ABNORMAL HIGH (ref 0.61–1.24)
GFR calc Af Amer: 48 mL/min — ABNORMAL LOW (ref >60)
GFR calc non Af Amer: 42 mL/min — ABNORMAL LOW (ref >60)
Glucose, Bld: 121 mg/dL — ABNORMAL HIGH (ref 70–99)
POTASSIUM: 3.3 mmol/L — AB (ref 3.5–5.1)
Sodium: 136 mmol/L (ref 135–145)

## 2018-11-07 LAB — PROTIME-INR
INR: 2.93
Prothrombin Time: 30.1 seconds — ABNORMAL HIGH (ref 11.4–15.2)

## 2018-11-07 LAB — MAGNESIUM: Magnesium: 2.1 mg/dL (ref 1.7–2.4)

## 2018-11-07 LAB — LACTATE DEHYDROGENASE: LDH: 205 U/L — ABNORMAL HIGH (ref 98–192)

## 2018-11-07 SURGERY — CARDIOVERSION
Anesthesia: General

## 2018-11-07 MED ORDER — PROMETHAZINE HCL 25 MG PO TABS: 12.5000 mg | ORAL_TABLET | Freq: Four times a day (QID) | ORAL | Status: DC | PRN

## 2018-11-07 MED ORDER — DOCUSATE SODIUM 100 MG PO CAPS: 100.0000 mg | ORAL_CAPSULE | Freq: Every day | ORAL | Status: DC

## 2018-11-07 MED ORDER — LEVOTHYROXINE SODIUM 75 MCG PO TABS: 150.0000 ug | ORAL_TABLET | Freq: Every day | ORAL | Status: DC

## 2018-11-07 MED ORDER — ADULT MULTIVITAMIN W/MINERALS CH: 1.0000 | ORAL_TABLET | Freq: Every day | ORAL | Status: DC

## 2018-11-07 MED ORDER — ZINC GLUCONATE 50 MG PO TABS: 50.0000 mg | ORAL_TABLET | Freq: Two times a day (BID) | ORAL | Status: DC

## 2018-11-07 MED ORDER — FLUDROCORTISONE ACETATE 0.1 MG PO TABS: 0.1000 mg | ORAL_TABLET | Freq: Two times a day (BID) | ORAL | Status: DC

## 2018-11-07 MED ORDER — POTASSIUM CHLORIDE CRYS ER 20 MEQ PO TBCR
60.0000 meq | EXTENDED_RELEASE_TABLET | Freq: Once | ORAL | Status: AC
  Administered 2018-11-07: 60 meq via ORAL
  Filled 2018-11-07: qty 3

## 2018-11-07 MED ORDER — PANTOPRAZOLE SODIUM 40 MG PO TBEC
40.0000 mg | DELAYED_RELEASE_TABLET | Freq: Two times a day (BID) | ORAL | Status: DC
  Administered 2018-11-07 – 2018-11-09 (×4): 40 mg via ORAL
  Filled 2018-11-07 (×4): qty 1

## 2018-11-07 MED ORDER — PANTOPRAZOLE SODIUM 40 MG PO TBEC: 40.0000 mg | DELAYED_RELEASE_TABLET | Freq: Two times a day (BID) | ORAL | Status: DC

## 2018-11-07 MED ORDER — ZINC SULFATE 220 (50 ZN) MG PO CAPS: 220.0000 mg | ORAL_CAPSULE | Freq: Every day | ORAL | Status: DC

## 2018-11-07 MED ORDER — POTASSIUM CHLORIDE CRYS ER 20 MEQ PO TBCR: 60.0000 meq | EXTENDED_RELEASE_TABLET | Freq: Once | ORAL | Status: DC

## 2018-11-07 MED ORDER — PROMETHAZINE HCL 25 MG/ML IJ SOLN
12.5000 mg | Freq: Four times a day (QID) | INTRAMUSCULAR | Status: DC | PRN
  Administered 2018-11-07: 12.5 mg via INTRAVENOUS
  Filled 2018-11-07 (×2): qty 1

## 2018-11-07 MED ORDER — AMIODARONE HCL IN DEXTROSE 360-4.14 MG/200ML-% IV SOLN
30.0000 mg/h | INTRAVENOUS | Status: DC
  Administered 2018-11-07 – 2018-11-08 (×2): 30 mg/h via INTRAVENOUS
  Filled 2018-11-07 (×3): qty 200

## 2018-11-07 MED ORDER — MIDODRINE HCL 5 MG PO TABS: 10.0000 mg | ORAL_TABLET | Freq: Three times a day (TID) | ORAL | Status: DC

## 2018-11-07 MED ORDER — SODIUM CHLORIDE 0.9% FLUSH
3.0000 mL | Freq: Two times a day (BID) | INTRAVENOUS | Status: DC
  Administered 2018-11-07 – 2018-11-09 (×3): 3 mL via INTRAVENOUS

## 2018-11-07 MED ORDER — SODIUM CHLORIDE 0.9 % IV SOLN
250.0000 mL | INTRAVENOUS | Status: DC
  Administered 2018-11-07: 13:00:00 via INTRAVENOUS
  Administered 2018-11-07: 250 mL via INTRAVENOUS

## 2018-11-07 MED ORDER — AMIODARONE HCL IN DEXTROSE 360-4.14 MG/200ML-% IV SOLN
60.0000 mg/h | INTRAVENOUS | Status: AC
  Administered 2018-11-07 (×2): 60 mg/h via INTRAVENOUS
  Filled 2018-11-07 (×2): qty 200

## 2018-11-07 MED ORDER — WARFARIN - PHARMACIST DOSING INPATIENT
Freq: Every day | Status: DC
  Administered 2018-11-07 – 2018-11-08 (×2)

## 2018-11-07 MED ORDER — AMIODARONE LOAD VIA INFUSION
150.0000 mg | Freq: Once | INTRAVENOUS | Status: AC
  Administered 2018-11-07: 150 mg via INTRAVENOUS
  Filled 2018-11-07: qty 83.34

## 2018-11-07 MED ORDER — AMIODARONE HCL IN DEXTROSE 360-4.14 MG/200ML-% IV SOLN: 60.0000 mg/h | INTRAVENOUS | Status: DC

## 2018-11-07 MED ORDER — ACETAMINOPHEN 325 MG PO TABS: 650.0000 mg | ORAL_TABLET | ORAL | Status: DC | PRN

## 2018-11-07 MED ORDER — ETOMIDATE 2 MG/ML IV SOLN
INTRAVENOUS | Status: DC | PRN
  Administered 2018-11-07: 8 mg via INTRAVENOUS
  Administered 2018-11-07: 4 mg via INTRAVENOUS

## 2018-11-07 MED ORDER — TEMAZEPAM 15 MG PO CAPS
30.0000 mg | ORAL_CAPSULE | Freq: Every evening | ORAL | Status: DC | PRN
  Administered 2018-11-07 – 2018-11-08 (×2): 30 mg via ORAL
  Filled 2018-11-07 (×2): qty 2

## 2018-11-07 MED ORDER — ATORVASTATIN CALCIUM 80 MG PO TABS: 80.0000 mg | ORAL_TABLET | Freq: Every day | ORAL | Status: DC

## 2018-11-07 MED ORDER — LEVOTHYROXINE SODIUM 75 MCG PO TABS
150.0000 ug | ORAL_TABLET | Freq: Every day | ORAL | Status: DC
  Administered 2018-11-08 – 2018-11-09 (×2): 150 ug via ORAL
  Filled 2018-11-07 (×2): qty 2

## 2018-11-07 MED ORDER — SOTALOL HCL 80 MG PO TABS: 80.0000 mg | ORAL_TABLET | Freq: Two times a day (BID) | ORAL | Status: DC

## 2018-11-07 MED ORDER — WARFARIN 0.5 MG HALF TABLET
0.5000 mg | ORAL_TABLET | Freq: Once | ORAL | Status: AC
  Administered 2018-11-07: 0.5 mg via ORAL
  Filled 2018-11-07: qty 1

## 2018-11-07 MED ORDER — SODIUM CHLORIDE 0.9% FLUSH: 3.0000 mL | INTRAVENOUS | Status: DC | PRN

## 2018-11-07 MED ORDER — AMIODARONE HCL IN DEXTROSE 360-4.14 MG/200ML-% IV SOLN: 30.0000 mg/h | INTRAVENOUS | Status: DC

## 2018-11-07 MED ORDER — MIDODRINE HCL 5 MG PO TABS
10.0000 mg | ORAL_TABLET | Freq: Three times a day (TID) | ORAL | Status: DC
  Administered 2018-11-07 – 2018-11-09 (×5): 10 mg via ORAL
  Filled 2018-11-07 (×6): qty 2

## 2018-11-07 MED ORDER — FLUDROCORTISONE ACETATE 0.1 MG PO TABS
0.1000 mg | ORAL_TABLET | Freq: Two times a day (BID) | ORAL | Status: DC
  Administered 2018-11-07 – 2018-11-09 (×4): 0.1 mg via ORAL
  Filled 2018-11-07 (×4): qty 1

## 2018-11-07 MED ORDER — ZINC SULFATE 220 (50 ZN) MG PO CAPS
220.0000 mg | ORAL_CAPSULE | Freq: Every day | ORAL | Status: DC
  Administered 2018-11-08 – 2018-11-09 (×2): 220 mg via ORAL
  Filled 2018-11-07 (×2): qty 1

## 2018-11-07 MED ORDER — DOCUSATE SODIUM 100 MG PO CAPS
100.0000 mg | ORAL_CAPSULE | Freq: Every day | ORAL | Status: DC
  Administered 2018-11-08: 100 mg via ORAL
  Filled 2018-11-07 (×2): qty 1

## 2018-11-07 MED ORDER — SOTALOL HCL 80 MG PO TABS
80.0000 mg | ORAL_TABLET | Freq: Two times a day (BID) | ORAL | Status: DC
  Administered 2018-11-07 – 2018-11-09 (×4): 80 mg via ORAL
  Filled 2018-11-07 (×4): qty 1

## 2018-11-07 MED ORDER — ONDANSETRON HCL 4 MG/2ML IJ SOLN: 4.0000 mg | Freq: Four times a day (QID) | INTRAMUSCULAR | Status: DC | PRN

## 2018-11-07 MED ORDER — AMIODARONE LOAD VIA INFUSION
150.0000 mg | Freq: Once | INTRAVENOUS | Status: DC
  Filled 2018-11-07: qty 83.34

## 2018-11-07 MED ORDER — ADULT MULTIVITAMIN W/MINERALS CH
1.0000 | ORAL_TABLET | Freq: Every day | ORAL | Status: DC
  Administered 2018-11-08 – 2018-11-09 (×2): 1 via ORAL
  Filled 2018-11-07 (×2): qty 1

## 2018-11-07 MED ORDER — ATORVASTATIN CALCIUM 80 MG PO TABS
80.0000 mg | ORAL_TABLET | Freq: Every day | ORAL | Status: DC
  Administered 2018-11-07 – 2018-11-08 (×2): 80 mg via ORAL
  Filled 2018-11-07 (×2): qty 1

## 2018-11-07 NOTE — Progress Notes (Addendum)
ANTICOAGULATION CONSULT NOTE - Initial Consult  Pharmacy Consult for warfarin Indication: LVAD  Allergies  Allergen Reactions  . Demerol [Meperidine] Other (See Comments)    Paralysis/ Could only move eyes    Patient Measurements:    Vital Signs: BP: 105/94 (01/03 1008) Pulse Rate: 104 (01/03 1006)  Labs: Recent Labs    11/07/18 0918  HGB 9.8*  HCT 32.1*  PLT 137*  LABPROT 30.1*  INR 2.93  CREATININE 1.56*    Estimated Creatinine Clearance: 42.1 mL/min (A) (by C-G formula based on SCr of 1.56 mg/dL (H)).   Medical History: Past Medical History:  Diagnosis Date  . Arthritis   . Automatic implantable cardioverter-defibrillator in situ    a. s/p Medtronic Evera device serial [id number]    . Bradycardia   . CAD (coronary artery disease)    a. s/p MI 1982;  s/p DES to CFX 2011;  s/p NSTEMI 4/12 in setting of VTach;  cath 7/12:  LM ok, LAD mid 30-40%, Dx's with 40%, mCFX stent patent, prox to mid RCA occluded with R to R and L to R collats.  Medical Tx was continued  . Chronic systolic heart failure ([place])    a. s/p LVAD 10/2013 for destination therapy  . CKD (chronic kidney disease)   . CVD (cerebrovascular disease)    a. s/p L CEA 2008  . Cytopenia   . Dysrhythmia    AFIB  . History of GI bleed    a. on ASA- started on plavix during 10/2015 admission for CVA  . HLD (hyperlipidemia)   . HTN (hypertension)   . Hypothyroidism   . Inappropriate therapy from implantable cardioverter-defibrillator    a. ATP for sinus tach SVT wavelet identified SVT but was no  passive (2014)  . Ischemic cardiomyopathy   . 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 L Ear   . Myocardial infarction ([place])    1992  . PAF (paroxysmal atrial fibrillation) ([place])   . PVD (peripheral vascular disease) ([place])    a. 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 Shortness of breath dyspnea    W/ EXERTION   . Sleep  apnea    NO CPAP NOW  HE SENT IT BACK YRS AGO  . Stroke [facility])    a. 10/2015 admission   . Ventricular tachycardia ([place])    a. s/p AICD;   h/o ICD shock;  Amiodarone Rx. S/P ablation in 2012      Assessment: 47 yoM with HM II LVAD admitted with low flow alarms 2/2 refractory VT. INR 2.93 on admit (slightly above goal). CBC stable, LDH 205. Will give slightly reduced dose tonight given slightly elevated INR and new IV amiodarone.  *PTA Warfarin Regimen = 1mg  Mon/Wed/Fri, 2mg  AODs  Goal of Therapy:  INR 2-2.5 Monitor platelets by anticoagulation protocol: Yes   Plan:  -Warfarin 0.5mg  PO x1 tonight -Daily protime  Arrie Senate, PharmD, BCPS Clinical Pharmacist [phone] Please check AMION for all [place] numbers 11/07/2018

## 2018-11-07 NOTE — Progress Notes (Signed)
Patient ID: [name], male   DOB: [birth year], 78 y.o.   MRN: [id number]   VAD CLINIC PROGRESS NOTE  PCP: Dr. Kandice Robinsons ([place]) GU: Dr Gloriann Loan Neuro Surgeon: Dr Vertell Limber Vascular: Dr Kellie Simmering Primary HF: Dr Darlyn Read [name] is a 78 y.o. male  with h/o VT, PAD and severe systolic HF (EF 83-66%) due to mixed ischemic/nonischemic CM he is s/p HM II LVAD implant for destination therapy on October 19, 2013.VAD implantation was complicated by VT, syncope, AF/Aflutter and RHF. Required short term milrinone.   GU Event 09/01/14 had impacted ureteral stone and underwent cystoscopy and flexible ureteroscopy with lithotripsy and basket extraction.  09/2015 Hematuria and chronic nephrolithiasis. Renal US showed mild hyronephrosis and bilateral renal cysts. Dr. Risa Grill took back for repeat lithotripsy and stone extraction by ureteroscopy and J stent placement. 12/18 Recurrent hematuria. 24mm left ureteral stone. Passed with tamulosin 1/19   Admitted with recurrent hematuria with ureteral stone. Stone removed but found to have large bladder mass which was removed. Course c/b obstructive uropathy and AKI requiring 1 session of HD.  3/19 Bladder chemo stopped after 2 treatments as unable to tolerate 05/16/18 s/p TURB with right ureteral stent placement.  09/2018 Recurrent bladder tumor excision with persistent hematuria requiring XRT  GI Event  02/2015 - Hgb 5.4 --> EGD that showed 2 AVMs in the jejunum that were clipped.  4/2-18 - Episode of melena. Hgb 10.9-> 8.7. EGD/colonoscopy negative 12/18 - Melena with syncope  Hgb 6.5. Transfused 5u. EGD with 2 AVMs with APC 10/24/18 EGD showed one non-bleeding AVM with APC.  10/25/18 Capsule results delayed due to software upgrade and holiday.    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  01/2016 R CEA. He was started on Plavix.  01/2016 Lumbar fusion L3-L5 with pedicle screws posterolateral arthrodesis with BMP, allograft  VT events 9/17  with high fevers and productive cough. Treated with levaquin x 7 days for acute bronchitis. Also found to have VT and underwent DC-CV. Seen by EP and amio increased 10/17 speed turned down to 8800 due to RV failure. 10/18 with slow VT and ADHF.Cardioverted in ED and amio uptitrated.  04/01/18 recurrent VT. Underwent DC-CV in Er 07/09/18 presented to ED. Urgently cardioverted. EP started on Sotolol.   Significant VAD Events  03/2017>percutaneous lead repair  Hospitalized 09/17/18-10/01/18 for heparin bridge for recurrent bladder tumor resection on 09/19/18. Renal US showed recurrent right hydronephrosis without BOO and evidence of recurrent bladder tumor. Underwent recurrent bladder tumor resection 09/19/18 with Dr Gloriann Loan.Found to be large fast-growing tumor with obstruction of right ureteral orifice (unable to uncover right ureteral orifice). Repeat renal US showed slightly improved right hydronephrosis and new left mild hydronephrosis. Urology offered nephrostomy tube, but he declined. CT  abdomen completed and showed bilateral hydronephrosis L>R and was negative for locally invasive disease. 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   He had urinary retention so Foley cath placed. This was later removed but he had catheter placed again. He had large clots so he started continuous irrigation. Dr Gloriann Loan and Dr Tammi Klippel recommended a few days on radiation due to ongoing hematuria. He under went radiation on 11/25, 11/26, and 11/27. Hemoglobin dropped down to 7.8 and Hematuria resolved so foley was removed and he was able to void. He will follow up with Dr Tammi Klippel in January.   Ramp echo done 11/14 and speed turned down to 8600. He had intermittent low flows and he  was given IV fluids. He has been instructed to continue to drink gatorade because this has been an ongoing problem. INR was followed closely. INR therapeutic on the day of discharge. PICC line  was remove on the day of discharge.   Admitted 12/18 - 10/28/18 from home with low flows and malaise. Found to be anemic. Given 1 uPRBCs, GI consulted for EGD which showed one non-bleeding AVM with APC. Capsule with 2 clear AVMs noted, one proximal duodenum and the other suspected jejunum. Pt continued to have intermittent low flows 12/23 and 10/28/18. Florinef added 10/26/18. Midodrine added 10/27/18.   He presents today for add on due to continuous low flows starting yesterday.  By ICD interrogation he has been in VT for > 16 hours. K was low at 3.2 earlier this week, and instructed to take 80 meq of K. K 3.3 today. He has had ATP attempted by his device 5 times unsuccessfully. He denies black stools, and hgb stable. Failed attempts to pace out in clinic with MD present and ICD rep. He denies fevers, chills, or problems with his driveline. + dizziness, worse with VT. He has been taking all medications as directed.   VAD Indication: Destination Therapy- age excluding    LVAD Interrogation HM 2: Speed: 8400 Flow: 4.4 PI: <2.5 consistently, Power: 3.8. > 200 low flows.   Past Medical History:  Diagnosis Date  . Arthritis   . Automatic implantable cardioverter-defibrillator in situ    a. s/p Medtronic Evera device serial [id number]    . Bradycardia   . CAD (coronary artery disease)    a. s/p MI 1982;  s/p DES to CFX 2011;  s/p NSTEMI 4/12 in setting of VTach;  cath 7/12:  LM ok, LAD mid 30-40%, Dx's with 40%, mCFX stent patent, prox to mid RCA occluded with R to R and L to R collats.  Medical Tx was continued  . Chronic systolic heart failure ([place])    a. s/p LVAD 10/2013 for destination therapy  . CKD (chronic kidney disease)   . CVD (cerebrovascular disease)    a. s/p L CEA 2008  . Cytopenia   . Dysrhythmia    AFIB  . History of GI bleed    a. on ASA- started on plavix during 10/2015 admission for CVA  . HLD (hyperlipidemia)   . HTN (hypertension)   . Hypothyroidism   .  Inappropriate therapy from implantable cardioverter-defibrillator    a. ATP for sinus tach SVT wavelet identified SVT but was no  passive (2014)  . Ischemic cardiomyopathy   . 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 L Ear   . Myocardial infarction ([place])    1992  . PAF (paroxysmal atrial fibrillation) ([place])   . PVD (peripheral vascular disease) ([place])    a. 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 Shortness of breath dyspnea    W/ EXERTION   . Sleep apnea    NO CPAP NOW  HE SENT IT BACK YRS AGO  . Stroke [facility])    a. 10/2015 admission   . Ventricular tachycardia ([place])    a. s/p AICD;   h/o ICD shock;  Amiodarone Rx. S/P ablation in 2012    Current Outpatient Medications  Medication Sig Dispense Refill  . amiodarone (PACERONE) 200 MG tablet Take 1 tablet (200 mg total) by mouth  2 (two) times daily. 120 tablet 5  . atorvastatin (LIPITOR) 80 MG tablet Take 1 tablet (80 mg total) by mouth at bedtime. 90 tablet 1  . docusate sodium (COLACE) 100 MG capsule Take 100 mg by mouth daily.     [name] dronabinol (MARINOL) 2.5 MG capsule Take 1 capsule (2.5 mg total) by mouth 2 (two) times daily before lunch and supper. (Patient not taking: Reported on 11/04/2018) 60 capsule 3  . fludrocortisone (FLORINEF) 0.1 MG tablet Take 1 tablet (0.1 mg total) by mouth 2 (two) times daily. 60 tablet 6  . furosemide (LASIX) 20 MG tablet Take 20 mg by mouth daily as needed for fluid.    [name] levothyroxine (SYNTHROID, LEVOTHROID) 150 MCG tablet Take 1 tablet (150 mcg total) by mouth daily before breakfast. 90 tablet 3  . midodrine (PROAMATINE) 10 MG tablet Take 1 tablet (10 mg total) by mouth 3 (three) times daily with meals. 90 tablet 3  . Multiple Vitamin (MULTIVITAMIN WITH MINERALS) TABS tablet Take 1 tablet by mouth daily.    . Multiple Vitamins-Minerals (ICAPS PO) Take 1 tablet by mouth 2 (two) times daily.    . pantoprazole (PROTONIX) 40  MG tablet TAKE 1 TABLET TWICE DAILY (Patient taking differently: Take 40 mg by mouth 2 (two) times daily before a meal. ) 180 tablet 6  . promethazine (PHENERGAN) 12.5 MG tablet Take 12.5 mg by mouth every 6 (six) hours as needed for nausea or vomiting.    . protein supplement shake (PREMIER PROTEIN) LIQD Take 11 oz by mouth 2 (two) times daily.    . sotalol (BETAPACE) 80 MG tablet Take 1 tablet (80 mg total) by mouth 2 (two) times daily. 60 tablet 6  . temazepam (RESTORIL) 30 MG capsule Take 1 capsule (30 mg total) by mouth at bedtime as needed for sleep. 30 capsule 3  . warfarin (COUMADIN) 2 MG tablet Take 2 mg daily, further as directed by VAD clinic.    [name] zinc gluconate 50 MG tablet Take 50 mg by mouth 2 (two) times daily.      No current facility-administered medications for this encounter.     Demerol [meperidine]  Review of systems complete and found to be negative unless listed in HPI.    Vitals:   11/07/18 1006 11/07/18 1007 11/07/18 1008  BP: (!) 88/0 98/87 (!) 105/94  Pulse: (!) 104    SpO2: 96%      Physical exam: General: Fatigued appearing. NAD.  HEENT: Normal. Neck: Supple, JVP to jaw. Carotids OK.  Cardiac:  Mechanical heart sounds with LVAD hum present.  Lungs:  CTAB, normal effort.  Abdomen:  NT, ND, no HSM. No bruits or masses. +BS  LVAD exit site: Well-healed and incorporated. Dressing dry and intact. No erythema or drainage. Stabilization device present and accurately applied. Driveline dressing changed daily per sterile technique. Extremities:  Warm and dry. No cyanosis, clubbing, rash, or edema.  Neuro:  Alert & oriented x 3. Cranial nerves grossly intact. Moves all 4 extremities w/o difficulty. Affect pleasant     ASSESSMENT AND PLAN:  1.RecurrentVTwith hypotension and low flow alarms on VAD: Underwent DC-CV 9/17, 08/17/17, and 04/01/18.  S/p emergent DC-CV in ER 07/09/18  - ? Rhythm today shows sinus tach on EKG, but confirmed to be VT by ICD  interrogation with 5 attempts at ATP at home.  - Attempted to pace out and ATP in clinic unsuccessfully.  - Having continuous low flows in setting of VT.  - Continue  sotalol.  - Start IV amiodarone and plan to DCCV this afternoon.  - Keep K > 4.0 Mg > 2.0  2. 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 s/p previous driveline [id number]. No further pump stops. [place] in 1/19 with mildly low output. Started on milrinone without benefitso it was stopped.  - NYHA III symptoms in setting of treatment for recurrent bladder tumor - Volume status appears elevated in setting of increase fluid intake, as directed. Will need to diurese once in NSR.  - Continue to encourage po intake.  - VAD speed turned down to 8400 3. Recurrent high-grade bladder tumor:  - S/p stone removal.  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  Had 2 BCG infusions with Dr. Gloriann Loan but discontinued due to inability to tolerate. - S/p repeat resection 7/19.  - s/p repeat bladder tumor excision in 11/19 with fast-growing tumor. C/b bilateral hydronephrosis and persistent hematuria requiring XRT. - Undergoing palliative XRT with Dr. Tammi Klippel. Denies hematuria.  4. CKD, stage III:  Had AKI in setting of obstructive uropathy and ATN. Required HD x1(11/2017).   - Cr 1.56 today.  -Not interested in nephrostomy tubes for hydronephrosis 5. LVAD:  - - VAD interrogated personally. Parameters stable.   - VAD speed turned down to 8400 - INR 2.93. Dosing per Pharm-D.  - LDH 205 6. GIB:  s/p clipping of 2 jejunal AVMs in 4/16. Continue coumadin and plavix.(with h/o CVA). Admitted 4/18 with recurrent GIB. Transfused 2u. EGD/colon negative. Admitted 12./18. hgb 6.5, Transfused 5u. EGD with 2 AVMs treated with APC.  - 10/24/18 EGD showed one non-bleeding AVM with APC.  - 10/25/18 Capsule with 2 clear AVMs noted, one proximal duodenum and the other suspected jejunum. Refuses Octreotide due to cost.  - Hgb 9.8. He  denies black stools.  7. Atrial arrhythmias: Atrial fibrillation, atrial tachycardia. s/p multiple DC-CVs last 07/02/18.  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  - Continue amiodarone and sotalol.  - VT by ICD interrogation/ Plan as above.  8. HTN:  - MAPS stable despite low flows.  9. Anticoagulation management:  - Goal INR 2-2.5. INR 2.9 today. Dosing per pharm D.  10. Lumbar compression fracture: S/p L3-L5 fusion 02/03/16. - Stable.  11. 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 S/p R CEA 01/13/16. Plavix stopped 12/18 due to recurrent GIB. - Continue atorvastatin (andwarfarin INR goal 2-2.5).  - No change to current plan.   12. H/o amio-induced hypethyroidism:  Followed by Dr. Forde Dandy in past.  - Amio restarted due to recurrent atrial arrhythmias and VT.  - Follow.  13. Hypokalemia - K 3.3 today. Will supp and plan DCCV this afternoon. - Likely in setting of much increased oral fluid intake.   Will admit to [place] IV amiodarone and DCCV with VT.   Shirley Friar, PA-C  9:57 AM

## 2018-11-07 NOTE — Procedures (Signed)
Electrical Cardioversion Procedure Note [name] [id number] December 06, 1940  Procedure: Electrical Cardioversion Indications:  Ventricular Tachycardia  Procedure Details Consent: Risks of procedure as well as the alternatives and risks of each were explained to the (patient/caregiver).  Consent for procedure obtained. Time Out: Verified patient identification, verified procedure, site/side was marked, verified correct patient position, special equipment/implants available, medications/allergies/relevent history reviewed, required imaging and test results available.  Performed  Patient placed on cardiac monitor, pulse oximetry, supplemental oxygen as necessary.  Sedation given: Per anesthesiology Pacer pads placed anterior and posterior chest.  Cardioverted 1 time(s).  Cardioverted at [place].  Evaluation Findings: Post procedure EKG shows: NSR Complications: None Patient did tolerate procedure well.   Loralie Champagne 11/07/2018, 1:40 PM

## 2018-11-07 NOTE — Addendum Note (Signed)
Addended by: Tanda Rockers B on: 11/07/2018 08:34 AM   Modules accepted: Orders

## 2018-11-07 NOTE — Anesthesia Postprocedure Evaluation (Signed)
Anesthesia Post Note  Patient: [name]  Procedure(s) Performed: CARDIOVERSION (N/A )     Patient location during evaluation: ICU Anesthesia Type: General Level of consciousness: awake and sedated Pain management: pain level controlled Vital Signs Assessment: post-procedure vital signs reviewed and stable Respiratory status: spontaneous breathing Cardiovascular status: stable Postop Assessment: no apparent nausea or vomiting Anesthetic complications: no    Last Vitals:  Vitals:   11/07/18 1317  BP: (!) 108/94    Last Pain: There were no vitals filed for this visit. Pain Goal:                 Huston Foley

## 2018-11-07 NOTE — Anesthesia Preprocedure Evaluation (Signed)
Anesthesia Evaluation  Patient identified by MRN, date of birth, ID band Patient awake    Reviewed: Allergy & Precautions, NPO status , Patient's Chart, lab work & pertinent test results  History of Anesthesia Complications Negative for: history of anesthetic complications  Airway Mallampati: II  TM Distance: >3 FB Neck ROM: Full    Dental  (+) Edentulous Upper, Edentulous Lower   Pulmonary sleep apnea , former smoker,    breath sounds clear to auscultation       Cardiovascular hypertension, Pt. on medications + CAD, + Past MI, + Peripheral Vascular Disease and +CHF  + dysrhythmias Atrial Fibrillation + Cardiac Defibrillator  Rhythm:Irregular  '19 RHC - Biventricular failure despite VAD support  '19 TTE - LV cavity size was mildly dilated. LVAD present.  Aortic root is mildly dilated at 41 mm. LA was severely dilated. RV cavity size was mildly dilated. Systolic function was moderately to severely reduced. RA was mildly dilated.  Difficult to auscultate due to whirring sound from LVAD   Neuro/Psych TIAnegative psych ROS   GI/Hepatic negative GI ROS, Neg liver ROS,   Endo/Other  Hypothyroidism   Renal/GU   negative genitourinary   Musculoskeletal  (+) Arthritis ,   Abdominal Normal abdominal exam  (+)   Peds  Hematology  (+) anemia ,   Anesthesia Other Findings  Patient:    Kol, Consuegra MR #:       [id number] Study Date: 09/18/2018 Gender:     M Age:        78 Height:     182.9 cm Weight:     76.4 kg BSA:        1.97 m^2 Pt. Status: Room:       2C01C   SONOGRAPHER  Dustin Flock, RCS  ADMITTING    Bensimhon, Daniel  ATTENDING    Bensimhon, Lanae Crumbly     Bensimhon, Glynis Smiles    Bensimhon, Daniel  PERFORMING   [facility], Inpatient  cc:  ------------------------------------------------------------------- LV EF: 30% -    35%  ------------------------------------------------------------------- History:   PMH:  RAMP study. LVAD evaluation.  Coronary artery disease.  Congestive heart failure.  ------------------------------------------------------------------- Study Conclusions  - Left ventricle: Systolic function was moderately to severely   reduced. The estimated ejection fraction was in the range of 30%   to 35%. - Mitral valve: There was mild regurgitation.  ------------------------------------------------------------------- Labs, prior tests, procedures, and surgery: Coronary artery bypass grafting.  ------------------------------------------------------------------- Study data:  Comparison was made to the study of 11/25/2017.  Study status:  Routine.  Procedure:  The patient reported no pain pre or post test. Transthoracic echocardiography. Image quality was adequate.  Study completion:  There were no complications. Transthoracic echocardiography.  M-mode, complete 2D, spectral Doppler, and color Doppler.  Birthdate:  Patient birthdate: 30-Apr-1941.  Age:  Patient is 78 yr old.  Sex:  Gender: male. BMI: 22.8 kg/m^2.  Blood pressure:     96/75  Patient status: Inpatient.  Study date:  Study date: 47  Reproductive/Obstetrics                             Anesthesia Physical  Anesthesia Plan  ASA: III  Anesthesia Plan: General   Post-op Pain Management:    Induction: Intravenous  PONV Risk Score and Plan: 2 and Propofol infusion and Treatment may vary due to age or medical condition  Airway Management Planned: Natural Airway and Simple Face Mask  Additional Equipment: None  Intra-op Plan:   Post-operative Plan:   Informed Consent: I have reviewed the patients History and Physical, chart, labs and discussed the procedure including the risks, benefits and alternatives for the proposed anesthesia with the patient or authorized representative who has indicated  his/her understanding and acceptance.     Plan Discussed with: CRNA  Anesthesia Plan Comments:         Anesthesia Quick Evaluation

## 2018-11-07 NOTE — H&P (Addendum)
Patient ID: [name], male   DOB: [birth year], 78 y.o.   MRN: [id number]   [facility] CLINIC H&P NOTE  PCP: Dr. Kandice Robinsons ([place]) GU: Dr Gloriann Loan Neuro Surgeon: Dr Vertell Limber Vascular: Dr Kellie Simmering Primary HF: Dr Darlyn Read [name] is a 78 y.o. male  with h/o VT, PAD and severe systolic HF (EF 63-01%) due to mixed ischemic/nonischemic CM he is s/p HM II LVAD implant for destination therapy on October 19, 2013.VAD implantation was complicated by VT, syncope, AF/Aflutter and RHF. Required short term milrinone.   GU Event 09/01/14 had impacted ureteral stone and underwent cystoscopy and flexible ureteroscopy with lithotripsy and basket extraction.  09/2015 Hematuria and chronic nephrolithiasis. Renal US showed mild hyronephrosis and bilateral renal cysts. Dr. Risa Grill took back for repeat lithotripsy and stone extraction by ureteroscopy and J stent placement. 12/18 Recurrent hematuria. 76mm left ureteral stone. Passed with tamulosin 1/19   Admitted with recurrent hematuria with ureteral stone. Stone removed but found to have large bladder mass which was removed. Course c/b obstructive uropathy and AKI requiring 1 session of HD.  3/19 Bladder chemo stopped after 2 treatments as unable to tolerate 05/16/18 s/p TURB with right ureteral stent placement.  09/2018 Recurrent bladder tumor excision with persistent hematuria requiring XRT  GI Event  02/2015 - Hgb 5.4 --> EGD that showed 2 AVMs in the jejunum that were clipped.  4/2-18 - Episode of melena. Hgb 10.9-> 8.7. EGD/colonoscopy negative 12/18 - Melena with syncope  Hgb 6.5. Transfused 5u. EGD with 2 AVMs with APC 10/24/18 EGD showed one non-bleeding AVM with APC.  10/25/18 Capsule results delayed due to software upgrade and holiday.    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  01/2016 R CEA. He was started on Plavix.  01/2016 Lumbar fusion L3-L5 with pedicle screws posterolateral arthrodesis with BMP, allograft  VT events 9/17 with  high fevers and productive cough. Treated with levaquin x 7 days for acute bronchitis. Also found to have VT and underwent DC-CV. Seen by EP and amio increased 10/17 speed turned down to 8800 due to RV failure. 10/18 with slow VT and ADHF.Cardioverted in ED and amio uptitrated.  04/01/18 recurrent VT. Underwent DC-CV in Er 07/09/18 presented to ED. Urgently cardioverted. EP started on Sotolol.   Significant VAD Events   03/2017> percutaneous lead repair  Hospitalized 09/17/18-10/01/18 for heparin bridge for recurrent bladder tumor resection on 09/19/18. Renal US showed recurrent right hydronephrosis without BOO and evidence of recurrent bladder tumor. Underwent recurrent bladder tumor resection 09/19/18 with Dr Gloriann Loan. Found to be large fast-growing tumor with obstruction of right ureteral orifice (unable to uncover right ureteral orifice). Repeat renal US showed slightly improved right hydronephrosis and new left mild hydronephrosis. Urology offered nephrostomy tube, but he declined. CT  abdomen completed and showed bilateral hydronephrosis L>R and was negative for locally invasive disease. 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    He had urinary retention so Foley cath placed. This was later removed but he had catheter placed again. He had large clots so he started continuous irrigation. Dr Gloriann Loan and Dr Tammi Klippel recommended a few days on radiation due to ongoing hematuria. He under went radiation on 11/25, 11/26, and 11/27. Hemoglobin dropped down to 7.8 and Hematuria resolved so foley was removed and he was able to void. He will follow up with Dr Tammi Klippel in January.    Ramp echo done 11/14 and speed turned down to 8600. He had  intermittent low flows and he was given IV fluids. He has been instructed to continue to drink gatorade because this has been an ongoing problem. INR was followed closely. INR therapeutic on the day of discharge. PICC line was  remove on the day of discharge.   Admitted 12/18 - 10/28/18 from home with low flows and malaise. Found to be anemic. Given 1 uPRBCs, GI consulted for EGD which showed one non-bleeding AVM with APC. Capsule with 2 clear AVMs noted, one proximal duodenum and the other suspected jejunum. Pt continued to have intermittent low flows 12/23 and 10/28/18. Florinef added 10/26/18. Midodrine added 10/27/18.   He presents today for add on due to continuous low flows starting yesterday.  By ICD interrogation he has been in VT for > 16 hours. K was low at 3.2 earlier this week, and instructed to take 80 meq of K. K 3.3 today. He has had ATP attempted by his device 5 times unsuccessfully. He denies black stools, and hgb stable. Failed attempts to pace out in clinic with MD present and ICD rep. He denies fevers, chills, or problems with his driveline. + dizziness, worse with VT. He has been taking all medications as directed.   VAD Indication: Destination Therapy- age excluding     LVAD Interrogation HM 2: Speed: 8400 Flow: 4.4 PI: <2.5 consistently, Power: 3.8. > 200 low flows.   Past Medical History:  Diagnosis Date  . Arthritis   . Automatic implantable cardioverter-defibrillator in situ    a. s/p Medtronic Evera device serial [id number]    . Bradycardia   . CAD (coronary artery disease)    a. s/p MI 1982;  s/p DES to CFX 2011;  s/p NSTEMI 4/12 in setting of VTach;  cath 7/12:  LM ok, LAD mid 30-40%, Dx's with 40%, mCFX stent patent, prox to mid RCA occluded with R to R and L to R collats.  Medical Tx was continued  . Chronic systolic heart failure ([place])    a. s/p LVAD 10/2013 for destination therapy  . CKD (chronic kidney disease)   . CVD (cerebrovascular disease)    a. s/p L CEA 2008  . Cytopenia   . Dysrhythmia    AFIB  . History of GI bleed    a. on ASA- started on plavix during 10/2015 admission for CVA  . HLD (hyperlipidemia)   . HTN (hypertension)   . Hypothyroidism   . Inappropriate  therapy from implantable cardioverter-defibrillator    a. ATP for sinus tach SVT wavelet identified SVT but was no  passive (2014)  . Ischemic cardiomyopathy   . 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 L Ear   . Myocardial infarction ([place])    1992  . PAF (paroxysmal atrial fibrillation) ([place])   . PVD (peripheral vascular disease) ([place])    a. 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 Shortness of breath dyspnea    W/ EXERTION   . Sleep apnea    NO CPAP NOW  HE SENT IT BACK YRS AGO  . Stroke [facility])    a. 10/2015 admission   . Ventricular tachycardia ([place])    a. s/p AICD;   h/o ICD shock;  Amiodarone Rx. S/P ablation in 2012    No current facility-administered medications for this encounter.    Current Outpatient Medications  Medication Sig Dispense  Refill  . amiodarone (PACERONE) 200 MG tablet Take 1 tablet (200 mg total) by mouth 2 (two) times daily. 120 tablet 5  . atorvastatin (LIPITOR) 80 MG tablet Take 1 tablet (80 mg total) by mouth at bedtime. 90 tablet 1  . docusate sodium (COLACE) 100 MG capsule Take 100 mg by mouth daily.     [name] dronabinol (MARINOL) 2.5 MG capsule Take 1 capsule (2.5 mg total) by mouth 2 (two) times daily before lunch and supper. (Patient not taking: Reported on 11/04/2018) 60 capsule 3  . fludrocortisone (FLORINEF) 0.1 MG tablet Take 1 tablet (0.1 mg total) by mouth 2 (two) times daily. 60 tablet 6  . furosemide (LASIX) 20 MG tablet Take 20 mg by mouth daily as needed for fluid.    [name] levothyroxine (SYNTHROID, LEVOTHROID) 150 MCG tablet Take 1 tablet (150 mcg total) by mouth daily before breakfast. 90 tablet 3  . midodrine (PROAMATINE) 10 MG tablet Take 1 tablet (10 mg total) by mouth 3 (three) times daily with meals. 90 tablet 3  . Multiple Vitamin (MULTIVITAMIN WITH MINERALS) TABS tablet Take 1 tablet by mouth daily.    . Multiple Vitamins-Minerals (ICAPS PO) Take 1 tablet by  mouth 2 (two) times daily.    . pantoprazole (PROTONIX) 40 MG tablet TAKE 1 TABLET TWICE DAILY (Patient taking differently: Take 40 mg by mouth 2 (two) times daily before a meal. ) 180 tablet 6  . promethazine (PHENERGAN) 12.5 MG tablet Take 12.5 mg by mouth every 6 (six) hours as needed for nausea or vomiting.    . protein supplement shake (PREMIER PROTEIN) LIQD Take 11 oz by mouth 2 (two) times daily.    . sotalol (BETAPACE) 80 MG tablet Take 1 tablet (80 mg total) by mouth 2 (two) times daily. 60 tablet 6  . temazepam (RESTORIL) 30 MG capsule Take 1 capsule (30 mg total) by mouth at bedtime as needed for sleep. 30 capsule 3  . warfarin (COUMADIN) 2 MG tablet Take 2 mg daily, further as directed by VAD clinic.    [name] zinc gluconate 50 MG tablet Take 50 mg by mouth 2 (two) times daily.       Demerol [meperidine]  Review of systems complete and found to be negative unless listed in HPI.    Vitals:   11/07/18 1006 11/07/18 1007 11/07/18 1008  BP: (!) 88/0 98/87 (!) 105/94  Pulse: (!) 104    SpO2: 96%        Physical exam: General: Fatigued appearing. NAD.  HEENT: Normal. Neck: Supple, JVP to jaw. Carotids OK.  Cardiac:  Mechanical heart sounds with LVAD hum present.  Lungs:  CTAB, normal effort.  Abdomen:  NT, ND, no HSM. No bruits or masses. +BS  LVAD exit site: Well-healed and incorporated. Dressing dry and intact. No erythema or drainage. Stabilization device present and accurately applied. Driveline dressing changed daily per sterile technique. Extremities:  Warm and dry. No cyanosis, clubbing, rash, or edema.  Neuro:  Alert & oriented x 3. Cranial nerves grossly intact. Moves all 4 extremities w/o difficulty. Affect pleasant     ASSESSMENT AND PLAN:  1. Recurrent VT with hypotension and low flow alarms on VAD: Underwent DC-CV 9/17, 08/17/17, and 04/01/18.  S/p emergent DC-CV in ER 07/09/18  - ? Rhythm today shows sinus tach on EKG, but confirmed to be VT by ICD interrogation with 5  attempts at ATP at home.  - Attempted to pace out and ATP in clinic unsuccessfully.  -  Having continuous low flows in setting of VT.  - Continue sotalol.  - Start IV amiodarone and plan to DCCV this afternoon.  - Keep K > 4.0 Mg > 2.0  2. 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 s/p previous driveline repair 05/6225. No further pump stops. [place] in 1/19 with mildly low output. Started on milrinone without benefit so it was stopped.  - NYHA III symptoms in setting of treatment for recurrent bladder tumor - Volume status appears elevated in setting of increase fluid intake, as directed. Will need to diurese once in NSR.  - Continue to encourage po intake.  - VAD speed turned down to 8400 3. Recurrent high-grade bladder tumor:  - S/p stone removal.  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  Had 2 BCG infusions with Dr. Gloriann Loan but discontinued due to inability to tolerate. - S/p repeat resection 7/19.  - s/p repeat bladder tumor excision in 11/19 with fast-growing tumor. C/b bilateral hydronephrosis and persistent hematuria requiring XRT. - Undergoing palliative XRT with Dr. Tammi Klippel. Denies hematuria.  4. CKD, stage III:  Had AKI in setting of obstructive uropathy and ATN. Required HD x1 (11/2017).   - Cr 1.56 today.  -Not interested in nephrostomy tubes for hydronephrosis 5. LVAD:  - - VAD interrogated personally. Parameters stable.   - VAD speed turned down to 8400 - INR 2.93. Dosing per Pharm-D.  - LDH 205 6. GIB:  s/p clipping of 2 jejunal AVMs in 4/16. Continue coumadin and plavix.(with h/o CVA). Admitted 4/18 with recurrent GIB. Transfused 2u. EGD/colon negative. Admitted 12./18. hgb 6.5, Transfused 5u. EGD with 2 AVMs treated with APC.  - 10/24/18 EGD showed one non-bleeding AVM with APC.  - 10/25/18 Capsule with 2 clear AVMs noted, one proximal duodenum and the other suspected jejunum. Refuses Octreotide due to cost.  - Hgb 9.8. He denies black stools.  7.  Atrial arrhythmias: Atrial fibrillation, atrial tachycardia.  s/p multiple DC-CVs last 07/02/18.  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  - Continue amiodarone and sotalol.  - VT by ICD interrogation/ Plan as above.  8. HTN:  - MAPS stable despite low flows.  9. Anticoagulation management:  - Goal INR 2-2.5. INR 2.9 today. Dosing per pharm D.  10. Lumbar compression fracture:  S/p L3-L5 fusion 02/03/16. - Stable.  11. 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 S/p R CEA 01/13/16. Plavix stopped 12/18 due to recurrent GIB. - Continue atorvastatin (and warfarin INR goal 2-2.5).  - No change to current plan.   12. H/o amio-induced hypethyroidism:  Followed by Dr. Forde Dandy in past.  - Amio restarted due to recurrent atrial arrhythmias and VT.  - Follow.  13. Hypokalemia - K 3.3 today. Will supp and plan DCCV this afternoon. - Likely in setting of much increased oral fluid intake.   Will admit to [facility] IV amiodarone and DCCV with VT.  Plan for DCCV this afternoon ~ 1300.  Frank Friar, PA-C  10:40 AM   Advanced Heart Failure Team Pager [phone] (M-F; 7a - 4p)  Please contact [facility] for night-coverage after hours (4p -7a ) and weekends on [email or web address]  VAD Team Pager [phone] (7am - 7am)  Patient seen with PA, agree with the above note.   He presented to clinic with low flow alarms and slow VT (rate 110s-120s).  We were unable to pace him out of VT.  He was brought to the ICU, sedated by anesthesiology, and cardioverted back to NSR.  EP has seen, think that for now we will continue amiodarone and sotalol, as this is the first VT since sotalol was begun in 9/19.  Will give IV amiodarone for a couple of days.  If VT recurs, would consider stopping sotalol and starting mexiletine.    Significant RV failure.  He does have JVD, but will hold off on diuresis for the time being.   Loralie Champagne 11/07/2018 1:39 PM

## 2018-11-07 NOTE — Progress Notes (Signed)
Patient presents for sick visit in [facility] today with caregiver, [name]. Overnight and this morning he has had several low flow alarms. He says he feels tired, but he attributes this to his radiation treatments. Denies blood in stool or urine. Reports that he did not drink much yesterday.   EKG completed. Optival completed. Tomi Bamberger with Medtr[employer] interrogated device- slow VT. Attempted ATP unsuccessful. Plan for cardioversion this afternoon per Dr. Aundra Dubin.    Vital Signs:   Doppler Pressure: 88 Automatc BP:  99/87 (87)  105/94 (98)  107/93 (97) HR: 104-110 SPO2: 96%  Weight: 180.2 lb w/o eqt Last weight: 177.4 lb   VAD Indication: Destination Therapy- age excluding    VAD interrogation & Equipment Management: Speed: 8600 Flow:  2.6 Power: 3.6w    PI: 4.7  Alarms: 200+ low flow alarms since 1944 11/06/2018. Asymptomatic Events: rare  Fixed speed 8600 Low speed limit: 8000  Primary Controller: Replace back up battery in 22 months Back up controller: Replace back up battery in 32months  Exit Site Care: Drive line is being maintained weekly by [name]. Drive line exit site well healed and incorporated. The velour is fully implanted at exit site. Dressing dry and intact. No erythema or drainage. Stabilization device present and accurately applied. Pt denies fever or chills. Pt has adequate dressing kits at home.   Significant Events on VAD Support:  02/2015 >>GIB (removed ASA, 2-2.5 INR) 10/2015 >>CVA(Plavix started) 02/2017> GIB (removed ASA, scopes negative) 03/2017> percutaneous lead repair 12/2017> Bladder CA  Device:Medtronic Therapies: on 200 bpm AT monitor: 140 bpm Last check: today   BP & Labs:  MAP 88 - Doppler is reflecting MAP  Hgb 9.8 - Denies any signs/symptoms of bleeding or nosebleeds.   LDH 205  - with established baseline of 280-380. Denies tea-colored urine. No power elevations noted on interrogation.    Patient Instructions: 1. Admit  patient to 2H for cardioversion.     Emerson Monte RN [place] Coordinator  Office: [phone]  24/7 Pager: [phone]

## 2018-11-07 NOTE — Consult Note (Addendum)
Cardiology Consultation:   Patient ID: [name] MRN: [id number]; DOB: 07/11/1941  Admit date: 11/07/2018 Date of Consult: 11/07/2018  Primary Care Provider: Jolaine Artist, MD Primary Cardiologist: Dr. Haroldine Laws Primary Electrophysiologist:  Dr. Caryl Comes   Patient Profile:   [name] is a 78 y.o. male with a hx of  who is being seen today for the evaluation of VT, PAD 01/2016 R CEA, and severe systolic HF (EF 59-16%) due to mixed ischemic/nonischemic CM he is s/p HM II LVAD implant for destination therapy on October 19, 2013.VAD implantation was complicated by VT, syncope, AF/Aflutter and RHF, has had a number if issues with recurrent VT and comes again from the AHF clinic with VT, planned for DCCV, EP is asked to revisit his AAD at the request of Dr. Aundra Dubin.  VT events 9/17 with high fevers and productive cough. Treated with levaquin x 7 days for acute bronchitis. Also found to have VT and underwent DC-CV. Seen by EP and amio increased 10/17 speed turned down to 8800 due to RV failure. 10/18 with slow VT and ADHF.Cardioverted in ED and amio uptitrated.  04/01/18 recurrent VT. Underwent DC-CV in ER (asfter failed attempts to pace-terminate in the office 06/10/18 AFib underwent DC-CV 07/02/18 AFL underwent DC-CV 07/09/18 VT emergently cardioverted in ER, mexiletine added, stopped 9/6, Sotalol added given h/o atrial arrhythmias as well Today, 11/07/18  VT , failed ATP by device, failed attempts to pace terminate in clinic  AAD:  Ranexa goes back several years amiodarone, has been stopped historically 2/2 thyroid, though with more VT resumed, is current Mexiletine discontinued 07/11/18 Sotalol, started 07/11/18, is current  VT ablation Nov 2012, repeat study in Aug 2014 was not inducible  Device information: MDT dual chamber ICD, implanted 07/05/15 Dr. Caryl Comes notes: He underwent device revision with insertion of an atrial lead and generator replacement 7/14  History of Present  Illness:   [name] was seen at the AHF clinic as an add on 2/2 continuous low flows starting yesterday, he was found to be in VT confirmed by device interrogation with 5 ATPs all unsuccessful.  He was noted to be hypokalemic with K+ of 3.3 (despite replacement earlier in the week).  Attempts in the clinic ist seems by H&P were made to pace terminate the VT also unsuccessful.  His is admitted, planned for amiodarone bolus./gtt and DCCV.  He is currently on for rate/rhythm management: Amiodarone 200mg  BID >> gtt  Sotalol 80mg  BID  LABS K+ 3.3 Mag 2.1 BUN/Creat 39/1.56 WBC 6.2 H/H 9.8/32 plts 137 INR 2.93  The patient denies any CP, no near syncope or syncope, + for generalized fatigue   Past Medical History:  Diagnosis Date  . Arthritis   . Automatic implantable cardioverter-defibrillator in situ    a. s/p Medtronic Evera device serial [id number]    . Bradycardia   . CAD (coronary artery disease)    a. s/p MI 1982;  s/p DES to CFX 2011;  s/p NSTEMI 4/12 in setting of VTach;  cath 7/12:  LM ok, LAD mid 30-40%, Dx's with 40%, mCFX stent patent, prox to mid RCA occluded with R to R and L to R collats.  Medical Tx was continued  . Chronic systolic heart failure ([place])    a. s/p LVAD 10/2013 for destination therapy  . CKD (chronic kidney disease)   . CVD (cerebrovascular disease)    a. s/p L CEA 2008  . Cytopenia   . Dysrhythmia    AFIB  .  History of GI bleed    a. on ASA- started on plavix during 10/2015 admission for CVA  . HLD (hyperlipidemia)   . HTN (hypertension)   . Hypothyroidism   . Inappropriate therapy from implantable cardioverter-defibrillator    a. ATP for sinus tach SVT wavelet identified SVT but was no  passive (2014)  . Ischemic cardiomyopathy   . Melanoma of ear ([name])    a. L Ear   . Myocardial infarction ([name])    1992  . PAF (paroxysmal atrial fibrillation) ([name])   . PVD (peripheral vascular disease) ([name])    a. h/o claudication;  s/p R fem to pop BPG  3/11;  s/p L CFA BPG 7/03;  s/p repair of inf AAA 6/03;  s/p aorta to bilat renal BPG 6/03  . Shortness of breath dyspnea    W/ EXERTION   . Sleep apnea    NO CPAP NOW  HE SENT IT BACK YRS AGO  . Stroke [facility])    a. 10/2015 admission   . Ventricular tachycardia ([name])    a. s/p AICD;   h/o ICD shock;  Amiodarone Rx. S/P ablation in 2012    Past Surgical History:  Procedure Laterality Date  . BACK SURGERY    . CARDIAC CATHETERIZATION     "several" (05/25/2013)  . CARDIAC DEFIBRILLATOR PLACEMENT  2003; 2005; 05/25/2013   2014: Medtronic Evera device serial number [id number]  . CARDIAC ELECTROPHYSIOLOGY STUDY AND ABLATION  2012  . CARDIOVERSION N/A 10/15/2013   Procedure: CARDIOVERSION;  Surgeon: Jolaine Artist, MD;  Location: [place];  Service: Cardiovascular;  Laterality: N/A;  . CARDIOVERSION N/A 11/04/2013   Procedure: CARDIOVERSION;  Surgeon: Larey Dresser, MD;  Location: [place];  Service: Cardiovascular;  Laterality: N/A;  . CARDIOVERSION N/A 06/04/2014   Procedure: CARDIOVERSION;  Surgeon: Jolaine Artist, MD;  Location: [facility] ENDOSCOPY;  Service: Cardiovascular;  Laterality: N/A;  . CARDIOVERSION N/A 03/02/2015   Procedure: CARDIOVERSION;  Surgeon: Jolaine Artist, MD;  Location: [facility] ENDOSCOPY;  Service: Cardiovascular;  Laterality: N/A;  . CARDIOVERSION N/A 01/19/2016   Procedure: CARDIOVERSION;  Surgeon: Larey Dresser, MD;  Location: [place];  Service: Cardiovascular;  Laterality: N/A;  . CARDIOVERSION N/A 06/10/2018   Procedure: CARDIOVERSION;  Surgeon: Jolaine Artist, MD;  Location: [facility] ENDOSCOPY;  Service: Cardiovascular;  Laterality: N/A;  . CARDIOVERSION N/A 07/02/2018   Procedure: CARDIOVERSION;  Surgeon: Jolaine Artist, MD;  Location: [place];  Service: Cardiovascular;  Laterality: N/A;  . CAROTID ENDARTERECTOMY Left ~ 2007  . CATARACT EXTRACTION W/ INTRAOCULAR LENS  IMPLANT, BILATERAL Bilateral 2000  . CHOLECYSTECTOMY  12/15/?2010  .  COLONOSCOPY WITH PROPOFOL N/A 12/26/2016   Procedure: COLONOSCOPY WITH PROPOFOL;  Surgeon: Ronald Lobo, MD;  Location: [place];  Service: Endoscopy;  Laterality: N/A;  . CORONARY ANGIOPLASTY  1992  . CORONARY ANGIOPLASTY WITH STENT PLACEMENT     "last one was 11/2012  (05/25/2013)  . CYSTOGRAM N/A 09/19/2018   Procedure: CYSTOGRAM;  Surgeon: Lucas Mallow, MD;  Location: [place];  Service: Urology;  Laterality: N/A;  . CYSTOSCOPY W/ RETROGRADES Bilateral 05/16/2018   Procedure: CYSTOSCOPY WITH RETROGRADE PYELOGRAM;  Surgeon: Lucas Mallow, MD;  Location: [place];  Service: Urology;  Laterality: Bilateral;  . CYSTOSCOPY WITH RETROGRADE PYELOGRAM, URETEROSCOPY AND STENT PLACEMENT Left 08/09/2014   Procedure: CYSTOSCOPY WITH RETROGRADE PYELOGRAM, URETEROSCOPY AND STENT PLACEMENT;  Surgeon: Bernestine Amass, MD;  Location: [facility];  Service: Urology;  Laterality: Left;  .  CYSTOSCOPY WITH RETROGRADE PYELOGRAM, URETEROSCOPY AND STENT PLACEMENT Left 09/01/2014   Procedure: CYSTOSCOPY WITH URETEROSCOPY AND STENT REMOVAL;  Surgeon: Bernestine Amass, MD;  Location: [facility];  Service: Urology;  Laterality: Left;  . CYSTOSCOPY WITH RETROGRADE PYELOGRAM, URETEROSCOPY AND STENT PLACEMENT Left 09/20/2014   Procedure: CYSTOSCOPY WITH RETROGRADE PYELOGRAM, URETEROSCOPY AND STENT PLACEMENT, stone removal;  Surgeon: Bernestine Amass, MD;  Location[facility];  Service: Urology;  Laterality: Left;  . CYSTOSCOPY WITH RETROGRADE PYELOGRAM, URETEROSCOPY AND STENT PLACEMENT Bilateral 12/04/2017   Procedure: CYSTOSCOPY WITH BILATERAL RETROGRADE PYELOGRAM, LEFT URETEROSCOPY AND STENT PLACEMENT;  Surgeon: Lucas Mallow, MD;  Location: [place];  Service: Urology;  Laterality: Bilateral;  . CYSTOSCOPY WITH RETROGRADE PYELOGRAM, URETEROSCOPY AND STENT PLACEMENT Left 09/19/2018   Procedure: left  RETROGRADE PYELOGRAM;  Surgeon: Lucas Mallow, MD;  Location: [place];  Service: Urology;  Laterality: Left;  . DOPPLER  ECHOCARDIOGRAPHY  2011  . ELBOW ARTHROSCOPY Right 1990's  . ELECTROPHYSIOLOGIC STUDY N/A 07/19/2016   Procedure: Cardioversion;  Surgeon: Evans Lance, MD;  Location: [place] CV LAB;  Service: Cardiovascular;  Laterality: N/A;  . ENDARTERECTOMY Right 01/13/2016   Procedure: RIGHT CAROTID ARTERY ENDARTERECTOMY;  Surgeon: Mal Misty, MD;  Location: [place];  Service: Vascular;  Laterality: Right;  . ENTEROSCOPY Left 02/23/2017   Procedure: ENTEROSCOPY;  Surgeon: Ronald Lobo, MD;  Location: [facility] ENDOSCOPY;  Service: Endoscopy;  Laterality: Left;  . ENTEROSCOPY N/A 10/17/2017   Procedure: ENTEROSCOPY;  Surgeon: Yetta Flock, MD;  Location: [facility] ENDOSCOPY;  Service: Gastroenterology;  Laterality: N/A;  LVAD pt.    . ENTEROSCOPY N/A 10/24/2018   Procedure: ENTEROSCOPY;  Surgeon: Rush Landmark Telford Nab., MD;  Location: [place];  Service: Gastroenterology;  Laterality: N/A;  . ESOPHAGOGASTRODUODENOSCOPY N/A 02/15/2015   Procedure: ESOPHAGOGASTRODUODENOSCOPY (EGD);  Surgeon: Carol Ada, MD;  Location: [facility] ENDOSCOPY;  Service: Endoscopy;  Laterality: N/A;  LVAD  . EYE SURGERY     VITRECTOMY  LEFT 05/2012  . FEMORAL-POPLITEAL BYPASS GRAFT Right 2011  . FEMORAL-POPLITEAL BYPASS GRAFT Left 05/2002   Archie Endo 05/06/2002 (05/25/2013)  . GIVENS CAPSULE STUDY N/A 10/25/2018   Procedure: GIVENS CAPSULE STUDY;  Surgeon: Thornton Park, MD;  Location: [place];  Service: Gastroenterology;  Laterality: N/A;  . HEEL SPUR SURGERY Right 1990's  . HOLMIUM LASER APPLICATION Left 11/13/3233   Procedure: HOLMIUM LASER APPLICATION;  Surgeon: Bernestine Amass, MD;  [facility];  Service: Urology;  Laterality: Left;  . HOLMIUM LASER APPLICATION Left 5/73/2202   Procedure: HOLMIUM LASER APPLICATION;  Surgeon: Lucas Mallow, MD;  Location: [place];  Service: Urology;  Laterality: Left;  . HOT HEMOSTASIS N/A 10/24/2018   Procedure: HOT HEMOSTASIS (ARGON PLASMA COAGULATION/BICAP);  Surgeon:  Irving Copas., MD;  Location: [place];  Service: Gastroenterology;  Laterality: N/A;  . IMPLANTABLE CARDIOVERTER DEFIBRILLATOR GENERATOR CHANGE N/A 05/25/2013   Procedure: IMPLANTABLE CARDIOVERTER DEFIBRILLATOR GENERATOR CHANGE;  Surgeon: Deboraha Sprang, MD;  Location: [facility] CATH LAB;  Service: Cardiovascular;  Laterality: N/A;  . INSERTION OF IMPLANTABLE LEFT VENTRICULAR ASSIST DEVICE N/A 10/19/2013   Procedure: INSERTION OF IMPLANTABLE LEFT VENTRICULAR ASSIST DEVICE;  Surgeon: Gaye Pollack, MD;  Location: [place];  Service: Open Heart Surgery;  Laterality: N/A;  CIRC ARREST  NITRIC OXIDE  MEDTRONIC ICD  . INTRAOPERATIVE TRANSESOPHAGEAL ECHOCARDIOGRAM N/A 10/19/2013   Procedure: INTRAOPERATIVE TRANSESOPHAGEAL ECHOCARDIOGRAM;  Surgeon: Gaye Pollack, MD;  Location: [facility] OR;  Service: Open Heart Surgery;  Laterality: N/A;  .  IR FLUORO GUIDE CV LINE RIGHT  12/10/2017  . IR US GUIDE VASC ACCESS RIGHT  12/10/2017  . KNEE ARTHROSCOPY Bilateral 1990's  . LEAD REVISION N/A 06/05/2013   Procedure: LEAD REVISION;  Surgeon: Thompson Grayer, MD;  Location: [facility] CATH LAB;  Service: Cardiovascular;  Laterality: N/A;  . LEFT HEART CATHETERIZATION WITH CORONARY ANGIOGRAM N/A 11/24/2012   Procedure: LEFT HEART CATHETERIZATION WITH CORONARY ANGIOGRAM;  Surgeon: Peter M Martinique, MD;  Location: [facility] CATH LAB;  Service: Cardiovascular;  Laterality: N/A;  . LIPOMA EXCISION Left 2013   "near ear" (05/25/2013)  . [place]; 2000's  . PATCH ANGIOPLASTY Right 01/13/2016   Procedure: WITH  PATCH ANGIOPLASTY;  Surgeon: Mal Misty, MD;  Location: [place];  Service: Vascular;  Laterality: Right;  . RENAL ARTERY BYPASS Bilateral 2003  . REVASCULARIZATION / IN-SITU GRAFT LEG    . RIGHT HEART CATH N/A 11/29/2017   Procedure: RIGHT HEART CATH;  Surgeon: Jolaine Artist, MD;  Location: [place] CV LAB;  Service: Cardiovascular;  Laterality: N/A;  . RIGHT HEART CATHETERIZATION N/A 09/17/2013   Procedure:  RIGHT HEART CATH;  Surgeon: Jolaine Artist, MD;  Location: [facility] CATH LAB;  Service: Cardiovascular;  Laterality: N/A;  . RIGHT HEART CATHETERIZATION N/A 10/14/2013   Procedure: RIGHT HEART CATH;  Surgeon: Jolaine Artist, MD;  Location: [facility] CATH LAB;  Service: Cardiovascular;  Laterality: N/A;  . RIGHT HEART CATHETERIZATION N/A 11/16/2013   Procedure: RIGHT HEART CATH;  Surgeon: Jolaine Artist, MD;  Location: [facility] CATH LAB;  Service: Cardiovascular;  Laterality: N/A;  . RIGHT HEART CATHETERIZATION N/A 07/13/2014   Procedure: RIGHT HEART CATH;  Surgeon: Jolaine Artist, MD;  Location: [facility] CATH LAB;  Service: Cardiovascular;  Laterality: N/A;  . TEE WITHOUT CARDIOVERSION N/A 11/04/2013   Procedure: TRANSESOPHAGEAL ECHOCARDIOGRAM (TEE);  Surgeon: Larey Dresser, MD;  Location: [place];  Service: Cardiovascular;  Laterality: N/A;  . TEE WITHOUT CARDIOVERSION N/A 03/02/2015   Procedure: TRANSESOPHAGEAL ECHOCARDIOGRAM (TEE);  Surgeon: Jolaine Artist, MD;  Location: [facility] ENDOSCOPY;  Service: Cardiovascular;  Laterality: N/A;  . TEE WITHOUT CARDIOVERSION N/A 01/19/2016   Procedure: TRANSESOPHAGEAL ECHOCARDIOGRAM (TEE);  Surgeon: Larey Dresser, MD;  Location: [place];  Service: Cardiovascular;  Laterality: N/A;  . TONSILLECTOMY  1946  . TRANSURETHRAL RESECTION OF BLADDER TUMOR N/A 12/04/2017   Procedure: TRANSURETHRAL RESECTION OF BLADDER TUMOR (TURBT);  Surgeon: Lucas Mallow, MD;  Location: [place];  Service: Urology;  Laterality: N/A;  . TRANSURETHRAL RESECTION OF BLADDER TUMOR N/A 05/16/2018   Procedure: TRANSURETHRAL RESECTION OF BLADDER TUMOR (TURBT);  Surgeon: Lucas Mallow, MD;  Location: [place];  Service: Urology;  Laterality: N/A;  . TRANSURETHRAL RESECTION OF BLADDER TUMOR WITH MITOMYCIN-C N/A 09/19/2018   Procedure: TRANSURETHRAL RESECTION OF BLADDER TUMOR;  Surgeon: Lucas Mallow, MD;  Location: [place];  Service: Urology;  Laterality: N/A;  . TUMOR EXCISION Left  1960's   "fatty tumor" (05/25/2013)  . V-TACH ABLATION N/A 09/12/2011   Procedure: V-TACH ABLATION;  Surgeon: Evans Lance, MD;  Location: [facility] CATH LAB;  Service: Cardiovascular;  Laterality: N/A;  . V-TACH ABLATION N/A 06/08/2013   Procedure: V-TACH ABLATION;  Surgeon: Evans Lance, MD;  Location: [facility] CATH LAB;  Service: Cardiovascular;  Laterality: N/A;     Home Medications:  Prior to Admission medications   Medication Sig Start Date End Date Taking? Authorizing Provider  amiodarone (PACERONE) 200 MG tablet Take 1 tablet (  200 mg total) by mouth 2 (two) times daily. 08/29/18   Bensimhon, Shaune Pascal, MD  atorvastatin (LIPITOR) 80 MG tablet Take 1 tablet (80 mg total) by mouth at bedtime. 08/15/18   Bensimhon, Shaune Pascal, MD  docusate sodium (COLACE) 100 MG capsule Take 100 mg by mouth daily.     [provider]  dronabinol (MARINOL) 2.5 MG capsule Take 1 capsule (2.5 mg total) by mouth 2 (two) times daily before lunch and supper. Patient not taking: Reported on 11/04/2018 10/28/18   Shirley Friar, PA-C  fludrocortisone (FLORINEF) 0.1 MG tablet Take 1 tablet (0.1 mg total) by mouth 2 (two) times daily. 10/28/18   Georgiana Shore, NP  furosemide (LASIX) 20 MG tablet Take 20 mg by mouth daily as needed for fluid.    [provider]  levothyroxine (SYNTHROID, LEVOTHROID) 150 MCG tablet Take 1 tablet (150 mcg total) by mouth daily before breakfast. 10/25/17   Bensimhon, Shaune Pascal, MD  midodrine (PROAMATINE) 10 MG tablet Take 1 tablet (10 mg total) by mouth 3 (three) times daily with meals. 10/28/18   Georgiana Shore, NP  Multiple Vitamin (MULTIVITAMIN WITH MINERALS) TABS tablet Take 1 tablet by mouth daily.    [provider]  Multiple Vitamins-Minerals (ICAPS PO) Take 1 tablet by mouth 2 (two) times daily.    [provider]  pantoprazole (PROTONIX) 40 MG tablet TAKE 1 TABLET TWICE DAILY Patient taking differently: Take 40 mg by mouth 2 (two) times daily  before a meal.  05/05/18   Bensimhon, Shaune Pascal, MD  promethazine (PHENERGAN) 12.5 MG tablet Take 12.5 mg by mouth every 6 (six) hours as needed for nausea or vomiting.    [provider]  protein supplement shake (PREMIER PROTEIN) LIQD Take 11 oz by mouth 2 (two) times daily.    [provider]  sotalol (BETAPACE) 80 MG tablet Take 1 tablet (80 mg total) by mouth 2 (two) times daily. 07/14/18   Georgiana Shore, NP  temazepam (RESTORIL) 30 MG capsule Take 1 capsule (30 mg total) by mouth at bedtime as needed for sleep. 11/04/18   Shirley Friar, PA-C  warfarin (COUMADIN) 2 MG tablet Take 2 mg daily, further as directed by VAD clinic. 10/29/18   Shirley Friar, PA-C  zinc gluconate 50 MG tablet Take 50 mg by mouth 2 (two) times daily.     [provider]    Inpatient Medications: Scheduled Meds: . amiodarone  150 mg Intravenous Once  . atorvastatin  80 mg Oral QHS  . [START ON 11/08/2018] docusate sodium  100 mg Oral Daily  . fludrocortisone  0.1 mg Oral BID  . [START ON 11/08/2018] levothyroxine  150 mcg Oral QAC breakfast  . midodrine  10 mg Oral TID WC  . [START ON 11/08/2018] multivitamin with minerals  1 tablet Oral Daily  . pantoprazole  40 mg Oral BID AC  . potassium chloride  60 mEq Oral Once  . sodium chloride flush  3 mL Intravenous Q12H  . sotalol  80 mg Oral BID  . zinc sulfate  220 mg Oral Daily   Continuous Infusions: . sodium chloride 250 mL (11/07/18 1152)  . amiodarone     Followed by  . amiodarone     PRN Meds: acetaminophen, ondansetron (ZOFRAN) IV, promethazine, sodium chloride flush, temazepam  Allergies:    Allergies  Allergen Reactions  . Demerol [Meperidine] Other (See Comments)    Paralysis/ Could only move eyes  Social History:   Social History   Socioeconomic History  . Marital status: Divorced    Spouse name: Not on file  . Number of children: 2  . Years of education: Not on file  . Highest education level:  Not on file  Occupational History  . Occupation: retired    Comment: Health visitor  . Occupation: Retired    Comment: Security [employer]  . Occupation: Part-time    Comment: Security at [employer]  . Financial resource strain: Not on file  . Food insecurity:    Worry: Not on file    Inability: Not on file  . Transportation needs:    Medical: No    Non-medical: No  Tobacco Use  . Smoking status: Former Smoker    Packs/day: 0.50    Years: 37.00    Pack years: 18.50    Types: Cigarettes    Last attempt to quit: 11/05/2001    Years since quitting: 17.0  . Smokeless tobacco: Never Used  Substance and Sexual Activity  . Alcohol use: Yes    Alcohol/week: 0.0 standard drinks    Comment: 05/25/2013 "mixed drink couple times/month"  . Drug use: No  . Sexual activity: Never  Lifestyle  . Physical activity:    Days per week: Not on file    Minutes per session: Not on file  . Stress: Not on file  Relationships  . Social connections:    Talks on phone: Not on file    Gets together: Not on file    Attends religious service: Not on file    Active member of club or organization: Not on file    Attends meetings of clubs or organizations: Not on file    Relationship status: Not on file  . Intimate partner violence:    Fear of current or ex partner: Not on file    Emotionally abused: Not on file    Physically abused: Not on file    Forced sexual activity: Not on file  Other Topics Concern  . Not on file  Social History Narrative   Lives with sig other.    Family History:   Family History  Problem Relation Age of Onset  . Heart attack Mother   . Coronary artery disease Mother   . Cancer Mother   . Heart disease Mother   . Hyperlipidemia Mother   . Hypertension Mother   . Coronary artery disease Father   . Stroke Father   . Cancer Father   . Heart disease Father        before age 3  . Hyperlipidemia Father   . Hypertension Father   . Heart attack Father    . Coronary artery disease Other      ROS:  Please see the history of present illness.  All other ROS reviewed and negative.     Physical Exam/Data:  There were no vitals filed for this visit. No intake or output data in the 24 hours ending 11/07/18 1220 There were no vitals filed for this visit. There is no height or weight on file to calculate BMI.  General:  Well nourished, well developed, in no acute distress HEENT: normal Lymph: no adenopathy Neck: no JVD Endocrine:  No thryomegaly Vascular: No carotid bruits Cardiac:  VAD hum Lungs:  CTA, no wheezing, rhonchi or rales  Abd: soft, nontender Ext: no edema Musculoskeletal:  No deformities, age appropriate atrophy Skin: warm and dry  Neuro:  No gross  focal abnormalities noted Psych:  Normal affect   EKG:  The EKG was personally reviewed and demonstrates:    VT, 104bpm  Telemetry:  Telemetry was personally reviewed and demonstrates:    VT low 100's  Relevant CV Studies:  09/18/18; TTE Study Conclusions - Left ventricle: Systolic function was moderately to severely   reduced. The estimated ejection fraction was in the range of 30%   to 35%. - Mitral valve: There was mild regurgitation.  Laboratory Data:  Chemistry Recent Labs  Lab 11/04/18 0854 11/07/18 0918  NA 142 136  K 3.2* 3.3*  CL 109 105  CO2 24 23  GLUCOSE 108* 121*  BUN 37* 39*  CREATININE 1.55* 1.56*  CALCIUM 9.0 8.7*  GFRNONAA 42* 42*  GFRAA 49* 48*  ANIONGAP 9 8    No results for input(s): PROT, ALBUMIN, AST, ALT, ALKPHOS, BILITOT in the last 168 hours. Hematology Recent Labs  Lab 11/04/18 0854 11/07/18 0918  WBC 6.4 6.1  RBC 2.99* 3.06*  HGB 9.8* 9.8*  HCT 31.3* 32.1*  MCV 104.7* 104.9*  MCH 32.8 32.0  MCHC 31.3 30.5  RDW 20.3* 20.5*  PLT 144* 137*   Cardiac EnzymesNo results for input(s): TROPONINI in the last 168 hours. No results for input(s): TROPIPOC in the last 168 hours.  BNPNo results for input(s): BNP, PROBNP in  the last 168 hours.  DDimer No results for input(s): DDIMER in the last 168 hours.  Radiology/Studies:  No results found.  Assessment and Plan:   1. Recurrent VT, slow     Not ameniable to ATP     Dr. Lovena Le with PES as well via his device without success      pt was sedated and DCCV to paced rhythm  This is the first VT that we are aware of since Sotalol added to his regime Given this, continue. Agree with amio gtt and potassium repletion      For questions or updates, please contact [facility] Please consult [email or web address] for contact info under     Signed, Baldwin Jamaica, PA-C  11/07/2018 12:20 PM  EP Attending  Patient seen and examined. Agree with above. At the bedside the patient appears stable but remained in VT on my arrival. He underwent aggressive attempted reversion of his VT with ATP and all unsuccessful. He has undergone dc cardioversion. He has been treated with amio and sotalol over the past several months. I have recommend we continue this for now, but might consider switching to Quinidine if he has recurrent symptoms on amio/sotalol.   Mikle Bosworth.D.

## 2018-11-07 NOTE — Transfer of Care (Signed)
Immediate Anesthesia Transfer of Care Note  Patient: [name]  Procedure(s) Performed: CARDIOVERSION (N/A )  Patient Location: ICU  Anesthesia Type:General  Level of Consciousness: drowsy, patient cooperative and responds to stimulation  Airway & Oxygen Therapy: Patient Spontanous Breathing and Patient connected to nasal cannula oxygen  Post-op Assessment: Report given to RN and Post -op Vital signs reviewed and stable  Post vital signs: Reviewed and stable  Last Vitals:  Vitals Value Taken Time  BP 117/97 11/07/2018  1:45 PM  Temp    Pulse 75 11/07/2018  1:50 PM  Resp 13 11/07/2018  1:50 PM  SpO2 97 % 11/07/2018  1:50 PM  Vitals shown include unvalidated device data.  Last Pain: There were no vitals filed for this visit.       Complications: No apparent anesthesia complications

## 2018-11-08 LAB — BASIC METABOLIC PANEL
Anion gap: 7 (ref 5–15)
BUN: 29 mg/dL — ABNORMAL HIGH (ref 8–23)
CO2: 22 mmol/L (ref 22–32)
Calcium: 8.4 mg/dL — ABNORMAL LOW (ref 8.9–10.3)
Chloride: 108 mmol/L (ref 98–111)
Creatinine, Ser: 1.36 mg/dL — ABNORMAL HIGH (ref 0.61–1.24)
GFR calc Af Amer: 57 mL/min — ABNORMAL LOW (ref >60)
GFR calc non Af Amer: 49 mL/min — ABNORMAL LOW (ref >60)
GLUCOSE: 90 mg/dL (ref 70–99)
Potassium: 3.3 mmol/L — ABNORMAL LOW (ref 3.5–5.1)
Sodium: 137 mmol/L (ref 135–145)

## 2018-11-08 LAB — CBC
HCT: 28.8 % — ABNORMAL LOW (ref 39.0–52.0)
Hemoglobin: 8.9 g/dL — ABNORMAL LOW (ref 13.0–17.0)
MCH: 31.8 pg (ref 26.0–34.0)
MCHC: 30.9 g/dL (ref 30.0–36.0)
MCV: 102.9 fL — ABNORMAL HIGH (ref 80.0–100.0)
NRBC: 0 % (ref 0.0–0.2)
Platelets: 121 10*3/uL — ABNORMAL LOW (ref 150–400)
RBC: 2.8 MIL/uL — ABNORMAL LOW (ref 4.22–5.81)
RDW: 20.4 % — ABNORMAL HIGH (ref 11.5–15.5)
WBC: 4.4 10*3/uL (ref 4.0–10.5)

## 2018-11-08 LAB — PROTIME-INR
INR: 2.67
PROTHROMBIN TIME: 28 s — AB (ref 11.4–15.2)

## 2018-11-08 LAB — LACTATE DEHYDROGENASE: LDH: 166 U/L (ref 98–192)

## 2018-11-08 MED ORDER — WARFARIN SODIUM 1 MG PO TABS
1.0000 mg | ORAL_TABLET | Freq: Once | ORAL | Status: AC
  Administered 2018-11-08: 1 mg via ORAL
  Filled 2018-11-08: qty 1

## 2018-11-08 MED ORDER — POTASSIUM CHLORIDE CRYS ER 20 MEQ PO TBCR
40.0000 meq | EXTENDED_RELEASE_TABLET | Freq: Once | ORAL | Status: AC
  Administered 2018-11-08: 40 meq via ORAL
  Filled 2018-11-08: qty 2

## 2018-11-08 NOTE — Progress Notes (Signed)
CARDIAC REHAB PHASE I   PRE:  Rate/Rhythm: 89 SR  BP:  Supine:   Sitting: 112/94 MAP 101  Standing:    SaO2: 98%RA 1325- 1350 Came to see pt to walk but he is not feeling up to it right now. Up in recliner and stated he will try to walk later if he feels up to it with staff. Going to take a nap. Talked with pt and emotional support given as CR has seen pt on many admissions. Will continue to follow.Graylon Good RN BSN 11/08/2018 1:48 PM             Graylon Good, RN BSN  11/08/2018 1:45 PM

## 2018-11-08 NOTE — Progress Notes (Signed)
Patient ID: [name], male   DOB: 1941/02/14, 78 y.o.   MRN: [id number]   Advanced Heart Failure VAD Team Note  PCP-Cardiologist: No primary care provider on file.   Subjective:    Admitted 1/3 with persistent VT, failed ATP.  He had DCCV with conversion to NSR, now on IV amiodarone.   No VT overnight, NSR this morning.  Multiple low flow events pre-DCCV.  Since DCCV, 1 low flow event early am.   No complaints this morning.   LVAD INTERROGATION:  HeartMate 2 LVAD:   Flow 3 liters/min, speed 8400, power 3.7, PI 6.5.  Since DCCV, 1 low flow event.    Objective:    Vital Signs:   Temp:  [97.7 F (36.5 C)-98.9 F (37.2 C)] 97.8 F (36.6 C) (01/04 0752) Pulse Rate:  [72-106] 84 (01/04 0700) Resp:  [13-27] 15 (01/04 0700) BP: (76-128)/(69-109) 106/92 (01/04 0700) SpO2:  [86 %-100 %] 97 % (01/04 0700) Weight:  [81.6 kg] 81.6 kg (01/04 0400)   Mean arterial Pressure 70s-90s  Intake/Output:   Intake/Output Summary (Last 24 hours) at 11/08/2018 0755 Last data filed at 11/08/2018 0600 Gross per 24 hour  Intake 577.62 ml  Output 1126 ml  Net -548.38 ml     Physical Exam    General:  Well appearing. No resp difficulty HEENT: normal Neck: supple. JVP 8-9 cm. Carotids 2+ bilat; no bruits. No lymphadenopathy or thyromegaly appreciated. Cor: Mechanical heart sounds with LVAD hum present. Lungs: clear Abdomen: soft, nontender, nondistended. No hepatosplenomegaly. No bruits or masses. Good bowel sounds. Driveline: C/D/I; securement device intact and driveline incorporated Extremities: no cyanosis, clubbing, rash, edema Neuro: alert & orientedx3, cranial nerves grossly intact. moves all 4 extremities w/o difficulty. Affect pleasant   Telemetry   NSR in 80s (personally reviewed)   Labs   Basic Metabolic Panel: Recent Labs  Lab 11/04/18 0854 11/07/18 0918 11/08/18 0343  NA 142 136 137  K 3.2* 3.3* 3.3*  CL 109 105 108  CO2 24 23 22   GLUCOSE 108* 121* 90  BUN 37*  39* 29*  CREATININE 1.55* 1.56* 1.36*  CALCIUM 9.0 8.7* 8.4*  MG  --  2.1  --     Liver Function Tests: No results for input(s): AST, ALT, ALKPHOS, BILITOT, PROT, ALBUMIN in the last 168 hours. No results for input(s): LIPASE, AMYLASE in the last 168 hours. No results for input(s): AMMONIA in the last 168 hours.  CBC: Recent Labs  Lab 11/04/18 0854 11/07/18 0918 11/08/18 0343  WBC 6.4 6.1 4.4  HGB 9.8* 9.8* 8.9*  HCT 31.3* 32.1* 28.8*  MCV 104.7* 104.9* 102.9*  PLT 144* 137* 121*    INR: Recent Labs  Lab 11/03/18 11/07/18 0918 11/08/18 0343  INR 2.7 2.93 2.67    Other results:  EKG:    Imaging    No results found.   Medications:     Scheduled Medications: . atorvastatin  80 mg Oral q1800  . docusate sodium  100 mg Oral Daily  . fludrocortisone  0.1 mg Oral BID  . levothyroxine  150 mcg Oral Q0600  . midodrine  10 mg Oral TID WC  . multivitamin with minerals  1 tablet Oral Daily  . pantoprazole  40 mg Oral BID  . potassium chloride  40 mEq Oral Once  . sodium chloride flush  3 mL Intravenous Q12H  . sotalol  80 mg Oral BID  . Warfarin - Pharmacist Dosing Inpatient   Does not apply q1800  .  zinc sulfate  220 mg Oral Daily     Infusions: . sodium chloride 1 mL/hr at 11/08/18 0600  . 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 S/p emergent DC-CV in ER 07/09/18.  Had no VT on combination sotalol/amiodarone until this admission.  Was admitted 11/07/18 with VT and low flow alarms, unable to pace out with ATP and had DCCV.  IV amiodarone started. EP consulted, recommended reloading IV amiodarone for a couple days and continuing sotalol + amiodarone as it had been generally successful.   - Continue IV amiodarone  today, back to po tomorrow if stable.  - If he has breakthrough VT again, consider stopping sotalol and using quinidine.  - Supplement K.  2. 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 s/p previous driveline [id number]. No further pump stops. [place] in 1/19 with mildly low output. Started on milrinone without benefitso it was stopped. He has significant RV failure, requires midodrine and fludrocortison. NYHA III symptoms in setting of treatment for recurrent bladder tumor. Mild volume overload on exam.  - Avoid Lasix as comfortable and also requires midodrine and fludrocortison.  3. Recurrent high-grade bladder tumor: S/p stone removal. 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 Had 2 BCG infusions with Dr. Gloriann Loan but discontinued due to inability to tolerate. S/p repeat resection 7/19. s/p repeat bladder tumor excision in 11/19 with fast-growing tumor. C/b bilateral hydronephrosis and persistent hematuria requiring XRT. - Undergoing palliative XRT with Dr. Tammi Klippel. Next XRT planned for Monday.  - If he is stable tomorrow, can be discharged so he can get to XRT Monday morning.  4. CKD, stage III: Had AKI in setting of obstructive uropathy and ATN. Required HD x1(11/2017). Creatinine stable at 1.36.  -Not interested in nephrostomy tubes for hydronephrosis 5. LVAD:Speed decreased to 8400 initially with VT.  VAD interrogated personally. Parameters stable, had 1 low flow since cardioversion.  LDH 166.  - INR 2.67. Dosing per Pharm-D.  6. GIB: s/p clipping of 2 jejunal AVMs in 4/16. Continue coumadin and plavix.(with h/o CVA). Admitted 4/18 with recurrent GIB. Transfused 2u. EGD/colon negative. Admitted 12/18. hgb 6.5, Transfused 5u. EGD with 2 AVMs treated with APC. 10/24/18 EGD showedone non-bleeding AVM with APC.  10/25/18 Capsule with 2 clear AVMs noted, one proximal duodenum and the other suspected jejunum. Refuses Octreotide due to cost.  Hgb 8.9. He denies black  stools.  7. Atrial arrhythmias: Atrial fibrillation, atrial tachycardia. s/p multiple DC-CVs last 07/02/18. 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  - Continueamiodarone and sotalol.  8. HTN:MAP stable.   9. Anticoagulation management:  Goal INR 2-2.5. INR 2.67 today. Dosing per pharm D.  10. Lumbar compression fracture: S/p L3-L5 fusion 02/03/16. - Stable.  11. 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 S/p R CEA 01/13/16. Plavix stopped 12/18 due to recurrent GIB. - Continue atorvastatin (andwarfarin INR goal 2-2.5).  12. H/o amio-induced hypethyroidism: Followed by Dr. Forde Dandy in past.  - Amio restarted due to recurrent atrial arrhythmias and VT. 13. Hypokalemia: Supplement.   Mobilize out of bed.   I reviewed the LVAD parameters from today, and compared the results to the patient's prior recorded data.  No programming changes were made.  The LVAD is functioning within specified parameters.  The patient performs LVAD self-test daily.  LVAD interrogation was  negative for any significant power changes, alarms or PI events/speed drops.  LVAD equipment check completed and is in good working order.  Back-up equipment present.   LVAD education done on emergency procedures and precautions and reviewed exit site care.  Length of Stay: 1  Loralie Champagne, MD 11/08/2018, 7:55 AM  VAD Team --- VAD ISSUES ONLY--- Pager [phone] (7am - 7am)  Advanced Heart Failure Team  Pager [phone] (M-F; 7a - 4p)  Please contact [facility] for night-coverage after hours (4p -7a ) and weekends on [email or web address]

## 2018-11-08 NOTE — Progress Notes (Signed)
[place] for warfarin Indication: LVAD  Allergies  Allergen Reactions  . Demerol [Meperidine] Other (See Comments)    Paralysis/ Could only move eyes  . Coconut Oil Hives    Patient Measurements: Weight: 179 lb 14.3 oz (81.6 kg)  Vital Signs: Temp: 97.8 F (36.6 C) (01/04 0752) Temp Source: Oral (01/04 0752) BP: 106/96 (01/04 0900) Pulse Rate: 86 (01/04 1000)  Labs: Recent Labs    11/07/18 0918 11/08/18 0343  HGB 9.8* 8.9*  HCT 32.1* 28.8*  PLT 137* 121*  LABPROT 30.1* 28.0*  INR 2.93 2.67  CREATININE 1.56* 1.36*    Estimated Creatinine Clearance: 48.3 mL/min (A) (by C-G formula based on SCr of 1.36 mg/dL (H)).   Medical History: Past Medical History:  Diagnosis Date  . Arthritis   . Automatic implantable cardioverter-defibrillator in situ    a. s/p Medtronic Evera device serial [id number]    . Bradycardia   . CAD (coronary artery disease)    a. s/p MI 1982;  s/p DES to CFX 2011;  s/p NSTEMI 4/12 in setting of VTach;  cath 7/12:  LM ok, LAD mid 30-40%, Dx's with 40%, mCFX stent patent, prox to mid RCA occluded with R to R and L to R collats.  Medical Tx was continued  . Chronic systolic heart failure ([place])    a. s/p LVAD 10/2013 for destination therapy  . CKD (chronic kidney disease)   . CVD (cerebrovascular disease)    a. s/p L CEA 2008  . Cytopenia   . Dysrhythmia    AFIB  . History of GI bleed    a. on ASA- started on plavix during 10/2015 admission for CVA  . HLD (hyperlipidemia)   . HTN (hypertension)   . Hypothyroidism   . Inappropriate therapy from implantable cardioverter-defibrillator    a. ATP for sinus tach SVT wavelet identified SVT but was no  passive (2014)  . Ischemic cardiomyopathy   . 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 L Ear   . Myocardial infarction ([place])    1992  . PAF (paroxysmal atrial fibrillation) ([place])   . PVD (peripheral vascular disease) ([place])    a. h/o claudication;  s/p R fem to pop BPG  3/11;  s/p L CFA BPG 7/03;  s/p repair of inf AAA 6/03;  s/p aorta to bilat renal BPG 6/03  . Shortness of breath dyspnea    W/ EXERTION   . Sleep apnea    NO CPAP NOW  HE SENT IT BACK YRS AGO  . Stroke [facility])    a. 10/2015 admission   . Ventricular tachycardia ([place])    a. s/p AICD;   h/o ICD shock;  Amiodarone Rx. S/P ablation in 2012      Assessment: 82 yoM with HM II LVAD admitted with low flow alarms 2/2 refractory VT. INR 2.93 on admit (slightly above goal). CBC stable, LDH 205. Will give slightly reduced dose tonight given slightly elevated INR and new IV amiodarone.  *PTA Warfarin Regimen = 1mg  Mon/Wed/Fri, 2mg  AODs  Goal of Therapy:  INR 2-2.5 Monitor platelets by anticoagulation protocol: Yes   Plan:  -Warfarin 1 mg PO x1 tonight -Daily INR.  Marguerite Olea, [facility] Clinical Pharmacist Phone [phone]  11/08/2018 10:55 AM

## 2018-11-09 LAB — BASIC METABOLIC PANEL
Anion gap: 6 (ref 5–15)
BUN: 26 mg/dL — ABNORMAL HIGH (ref 8–23)
CO2: 22 mmol/L (ref 22–32)
Calcium: 8.6 mg/dL — ABNORMAL LOW (ref 8.9–10.3)
Chloride: 110 mmol/L (ref 98–111)
Creatinine, Ser: 1.46 mg/dL — ABNORMAL HIGH (ref 0.61–1.24)
GFR calc Af Amer: 52 mL/min — ABNORMAL LOW (ref >60)
GFR calc non Af Amer: 45 mL/min — ABNORMAL LOW (ref >60)
Glucose, Bld: 98 mg/dL (ref 70–99)
Potassium: 4.1 mmol/L (ref 3.5–5.1)
Sodium: 138 mmol/L (ref 135–145)

## 2018-11-09 LAB — CBC
HCT: 28.5 % — ABNORMAL LOW (ref 39.0–52.0)
Hemoglobin: 9.2 g/dL — ABNORMAL LOW (ref 13.0–17.0)
MCH: 33.2 pg (ref 26.0–34.0)
MCHC: 32.3 g/dL (ref 30.0–36.0)
MCV: 102.9 fL — ABNORMAL HIGH (ref 80.0–100.0)
Platelets: 116 10*3/uL — ABNORMAL LOW (ref 150–400)
RBC: 2.77 MIL/uL — ABNORMAL LOW (ref 4.22–5.81)
RDW: 20.6 % — ABNORMAL HIGH (ref 11.5–15.5)
WBC: 4.5 10*3/uL (ref 4.0–10.5)
nRBC: 0 % (ref 0.0–0.2)

## 2018-11-09 LAB — LACTATE DEHYDROGENASE: LDH: 177 U/L (ref 98–192)

## 2018-11-09 LAB — PROTIME-INR
INR: 2.65
Prothrombin Time: 27.9 seconds — ABNORMAL HIGH (ref 11.4–15.2)

## 2018-11-09 MED ORDER — AMIODARONE HCL 200 MG PO TABS
200.0000 mg | ORAL_TABLET | Freq: Two times a day (BID) | ORAL | Status: DC
  Administered 2018-11-09: 200 mg via ORAL
  Filled 2018-11-09: qty 1

## 2018-11-09 NOTE — Progress Notes (Signed)
Progress Note  Patient Name: [name] Date of Encounter: 11/09/2018  Primary Cardiologist: Glori Bickers, MD   Subjective   "I feel better", no chest pain or sob.   Inpatient Medications    Scheduled Meds: . amiodarone  200 mg Oral BID  . atorvastatin  80 mg Oral q1800  . docusate sodium  100 mg Oral Daily  . fludrocortisone  0.1 mg Oral BID  . levothyroxine  150 mcg Oral Q0600  . midodrine  10 mg Oral TID WC  . multivitamin with minerals  1 tablet Oral Daily  . pantoprazole  40 mg Oral BID  . sodium chloride flush  3 mL Intravenous Q12H  . sotalol  80 mg Oral BID  . Warfarin - Pharmacist Dosing Inpatient   Does not apply q1800  . zinc sulfate  220 mg Oral Daily   Continuous Infusions: . sodium chloride 1 mL/hr at 11/09/18 0800   PRN Meds: acetaminophen, ondansetron (ZOFRAN) IV, promethazine, promethazine, sodium chloride flush, temazepam   Vital Signs    Vitals:   11/09/18 0700 11/09/18 0739 11/09/18 0800 11/09/18 0900  BP: (!) 108/93  (!) 113/99 100/89  Pulse: 86  87 88  Resp: 14  15 (!) 21  Temp:  98.9 F (37.2 C)    TempSrc:  Oral    SpO2: 97%  97% 97%  Weight:        Intake/Output Summary (Last 24 hours) at 11/09/2018 1015 Last data filed at 11/09/2018 0800 Gross per 24 hour  Intake 1061.65 ml  Output 1127 ml  Net -65.35 ml   Filed Weights   11/08/18 0400 11/09/18 0500  Weight: 81.6 kg 80.5 kg    Telemetry    nsr with PVC's - Personally Reviewed  ECG    none - Personally Reviewed  Physical Exam   GEN: No acute distress.   Neck: No JVD Cardiac: RRR, no murmurs, rubs, or gallops. High frequency sound Respiratory: Clear to auscultation bilaterally. GI: Soft, nontender, non-distended  MS: No edema; No deformity. Neuro:  Nonfocal  Psych: Normal affect   Labs    Chemistry Recent Labs  Lab 11/07/18 0918 11/08/18 0343 11/09/18 0216  NA 136 137 138  K 3.3* 3.3* 4.1  CL 105 108 110  CO2 23 22 22   GLUCOSE 121* 90 98  BUN  39* 29* 26*  CREATININE 1.56* 1.36* 1.46*  CALCIUM 8.7* 8.4* 8.6*  GFRNONAA 42* 49* 45*  GFRAA 48* 57* 52*  ANIONGAP 8 7 6      Hematology Recent Labs  Lab 11/07/18 0918 11/08/18 0343 11/09/18 0216  WBC 6.1 4.4 4.5  RBC 3.06* 2.80* 2.77*  HGB 9.8* 8.9* 9.2*  HCT 32.1* 28.8* 28.5*  MCV 104.9* 102.9* 102.9*  MCH 32.0 31.8 33.2  MCHC 30.5 30.9 32.3  RDW 20.5* 20.4* 20.6*  PLT 137* 121* 116*    Cardiac EnzymesNo results for input(s): TROPONINI in the last 168 hours. No results for input(s): TROPIPOC in the last 168 hours.   BNPNo results for input(s): BNP, PROBNP in the last 168 hours.   DDimer No results for input(s): DDIMER in the last 168 hours.   Radiology    No results found.  Cardiac Studies   none  Patient Profile     78 y.o. male admitted with VT storm.  Assessment & Plan    1. VT - he has been stable on IV amio since undergoing DCCV. Unable to pace terminate his VT. Agree with switching to oral  amio, continue sotalol for now, and followup with Dr. Renaldo Reel.  2. Chronic systolic heart failure - he is s/p LVAD. F/U Dr. Reine Just.   For questions or updates, please contact [place] Please consult [email or web address] for contact info under Cardiology/STEMI.      Signed, Cristopher Peru, MD  11/09/2018, 10:15 AM  Patient ID: [name], male   DOB: 1940-12-14, 78 y.o.   MRN: [id number]

## 2018-11-09 NOTE — Progress Notes (Signed)
Patient ID: [name], male   DOB: [birth year], 78 y.o.   MRN: [id number]   Advanced Heart Failure VAD Team Note  PCP-Cardiologist: No primary care provider on file.   Subjective:    Admitted 1/3 with persistent VT, failed ATP.  He had DCCV with conversion to NSR, now on IV amiodarone.   No VT overnight, NSR this morning.  Multiple low flow events pre-DCCV, none over the last day.   No complaints this morning.   LVAD INTERROGATION:  HeartMate 2 LVAD:   Flow 3.1 liters/min, speed 8400, power 3.8, PI 6.5.  No alarms.    Objective:    Vital Signs:   Temp:  [97.2 F (36.2 C)-99.3 F (37.4 C)] 98.9 F (37.2 C) (01/05 0739) Pulse Rate:  [82-109] 86 (01/05 0700) Resp:  [13-26] 14 (01/05 0700) BP: (88-122)/(59-96) 108/93 (01/05 0700) SpO2:  [88 %-100 %] 97 % (01/05 0700) Weight:  [80.5 kg] 80.5 kg (01/05 0500)   Mean arterial Pressure 70s-90s  Intake/Output:   Intake/Output Summary (Last 24 hours) at 11/09/2018 0743 Last data filed at 11/09/2018 0600 Gross per 24 hour  Intake 1196.9 ml  Output 1352 ml  Net -155.1 ml     Physical Exam    General: NAD Neck: JVP 8 cm, no thyromegaly or thyroid nodule.  Lungs: Clear to auscultation bilaterally with normal respiratory effort. CV: Nondisplaced PMI.  Heart regular S1/S2, no S3/S4, no murmur.  No peripheral edema.  No carotid bruit.  Normal pedal pulses.  Abdomen: Soft, nontender, no hepatosplenomegaly, no distention.  Skin: Intact without lesions or rashes.  Neurologic: Alert and oriented x 3.  Psych: Normal affect. Extremities: No clubbing or cyanosis.  HEENT: Normal.    Telemetry   NSR in 70s (personally reviewed)   Labs   Basic Metabolic Panel: Recent Labs  Lab 11/04/18 0854 11/07/18 0918 11/08/18 0343 11/09/18 0216  NA 142 136 137 138  K 3.2* 3.3* 3.3* 4.1  CL 109 105 108 110  CO2 24 23 22 22   GLUCOSE 108* 121* 90 98  BUN 37* 39* 29* 26*  CREATININE 1.55* 1.56* 1.36* 1.46*  CALCIUM 9.0 8.7* 8.4*  8.6*  MG  --  2.1  --   --     Liver Function Tests: No results for input(s): AST, ALT, ALKPHOS, BILITOT, PROT, ALBUMIN in the last 168 hours. No results for input(s): LIPASE, AMYLASE in the last 168 hours. No results for input(s): AMMONIA in the last 168 hours.  CBC: Recent Labs  Lab 11/04/18 0854 11/07/18 0918 11/08/18 0343 11/09/18 0216  WBC 6.4 6.1 4.4 4.5  HGB 9.8* 9.8* 8.9* 9.2*  HCT 31.3* 32.1* 28.8* 28.5*  MCV 104.7* 104.9* 102.9* 102.9*  PLT 144* 137* 121* 116*    INR: Recent Labs  Lab 11/03/18 11/07/18 0918 11/08/18 0343 11/09/18 0216  INR 2.7 2.93 2.67 2.65    Other results:  EKG:    Imaging   No results found.   Medications:     Scheduled Medications: . amiodarone  200 mg Oral BID  . atorvastatin  80 mg Oral q1800  . docusate sodium  100 mg Oral Daily  . fludrocortisone  0.1 mg Oral BID  . levothyroxine  150 mcg Oral Q0600  . midodrine  10 mg Oral TID WC  . multivitamin with minerals  1 tablet Oral Daily  . pantoprazole  40 mg Oral BID  . sodium chloride flush  3 mL Intravenous Q12H  . sotalol  80 mg Oral  BID  . Warfarin - Pharmacist Dosing Inpatient   Does not apply q1800  . zinc sulfate  220 mg Oral Daily    Infusions: . 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 S/p emergent DC-CV in ER 07/09/18.  Had no VT on combination sotalol/amiodarone until this admission.  Was admitted 11/07/18 with VT and low flow alarms, unable to pace out with ATP and had DCCV.  IV amiodarone started. EP consulted, recommended reloading IV amiodarone for a couple days and continuing sotalol + amiodarone as it had been generally successful.   - Stop IV amiodarone, start amiodarone 200 mg bid.   - If he has  breakthrough VT again, consider stopping sotalol and using quinidine.  2. 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 s/p previous driveline [id number]. No further pump stops. [place] in 1/19 with mildly low output. Started on milrinone without benefitso it was stopped. He has significant RV failure, requires midodrine and fludrocortison. NYHA III symptoms in setting of treatment for recurrent bladder tumor. Mild volume overload on exam.  - Avoid Lasix as comfortable and also requires midodrine and fludrocortisone.  3. Recurrent high-grade bladder tumor: S/p stone removal. 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 Had 2 BCG infusions with Dr. Gloriann Loan but discontinued due to inability to tolerate. S/p repeat resection 7/19. s/p repeat bladder tumor excision in 11/19 with fast-growing tumor. C/b bilateral hydronephrosis and persistent hematuria requiring XRT. - Undergoing palliative XRT with Dr. Tammi Klippel. Next XRT planned for Monday.   4. CKD, stage III: Had AKI in setting of obstructive uropathy and ATN. Required HD x1(11/2017). Creatinine stable at 1.46.  -Not interested in nephrostomy tubes for hydronephrosis 5. LVAD:Speed decreased to 8400 initially with VT.  VAD interrogated personally. Parameters stable, had 1 low flow since cardioversion.  LDH 166.  - INR 2.65. Dosing per Pharm-D.  6. GIB: s/p clipping of 2 jejunal AVMs in 4/16. Continue coumadin and plavix.(with h/o CVA). Admitted 4/18 with recurrent GIB. Transfused 2u. EGD/colon negative. Admitted 12/18. hgb 6.5, Transfused 5u. EGD with 2 AVMs treated with APC. 10/24/18 EGD showedone non-bleeding AVM with APC.  10/25/18 Capsule with 2 clear AVMs noted, one proximal duodenum and the other suspected jejunum. Refuses Octreotide due to cost.  Hgb 9.2 (stable). He denies black stools.  7. Atrial arrhythmias: Atrial fibrillation, atrial tachycardia. s/p multiple DC-CVs last 07/02/18. 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  -  Continueamiodarone and sotalol.  8. HTN:MAP stable.   9. Anticoagulation management:  Goal INR 2-2.5. INR 2.65 today. Dosing per pharm D.  10. Lumbar compression fracture: S/p L3-L5 fusion 02/03/16. - Stable.  11. 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 S/p R CEA 01/13/16. Plavix stopped 12/18 due to recurrent GIB. - Continue atorvastatin (andwarfarin INR goal 2-2.5).  12. H/o amio-induced hypethyroidism: Followed by Dr. Forde Dandy in past.  - Amio restarted due to recurrent atrial arrhythmias and VT. 13. Disposition:  He can go home today after 48 hr loading of IV amiodarone.  He will continue meds as he was doing prior to admission (amiodarone will remain 200 mg bid).  He will need followup in LVAD clinic.  He has to be at [facility] for radiation tomorrow.   I reviewed the LVAD parameters from today, and compared the results to the patient's  prior recorded data.  No programming changes were made.  The LVAD is functioning within specified parameters.  The patient performs LVAD self-test daily.  LVAD interrogation was negative for any significant power changes, alarms or PI events/speed drops.  LVAD equipment check completed and is in good working order.  Back-up equipment present.   LVAD education done on emergency procedures and precautions and reviewed exit site care.  Length of Stay: 2  Loralie Champagne, MD 11/09/2018, 7:43 AM  VAD Team --- VAD ISSUES ONLY--- Pager [phone] (7am - 7am)  Advanced Heart Failure Team  Pager [phone] (M-F; 7a - 4p)  Please contact [facility] for night-coverage after hours (4p -7a ) and weekends on [email or web address]

## 2018-11-09 NOTE — Progress Notes (Signed)
New heat pack applied to the dorsum of the patient's hand.  Patient's PIV infiltrated last night.  Redness and swelling present.  Elevated on pillow as well.  Denies need for pain medication.

## 2018-11-09 NOTE — Discharge Summary (Signed)
Discharge Summary    Patient ID: MUAAZ BRAU MRN: [id number]; DOB: Jun 15, 1941  Admit date: 11/07/2018 Discharge date: 11/09/2018  Primary Care Provider: Jolaine Artist, MD  Primary Cardiologist: Glori Bickers, MD  Primary Electrophysiologist:  Virl Axe, MD   Discharge Diagnoses    Active Problems:   Ventricular tachyarrhythmia ([place])   Allergies Allergies  Allergen Reactions  . Demerol [Meperidine] Other (See Comments)    Paralysis/ Could only move eyes  . Coconut Oil Hives    Diagnostic Studies/Procedures    DCCV 11/07/2018 Procedure Details Consent: Risks of procedure as well as the alternatives and risks of each were explained to the (patient/caregiver).  Consent for procedure obtained. Time Out: Verified patient identification, verified procedure, site/side was marked, verified correct patient position, special equipment/implants available, medications/allergies/relevent history reviewed, required imaging and test results available.  Performed  Patient placed on cardiac monitor, pulse oximetry, supplemental oxygen as necessary.  Sedation given: Per anesthesiology Pacer pads placed anterior and posterior chest.  Cardioverted 1 time(s).  Cardioverted at [place].  Evaluation Findings: Post procedure EKG shows: NSR Complications: None Patient did tolerate procedure well. _____________   History of Present Illness     [name] is a 78 y.o. 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   Mr [place] presented on 11/07/2018 due to continuous low flow readings on LVAD starting the prior day.  By ICD interrogation he had been in V. tach for greater than 16 hours.  Potassium was low at 3.2 earlier in the week for which she  was given potassium 80 mEq.  Potassium was 3.3 at follow-up on 11/07/2018.  He had had ATP attempts by his device 5 times unsuccessfully.  He denied black stools and hemoglobin stable.  He denied fevers, chills or problems with his driveline.  He did have dizziness, worse with DVT.  He had been taking all of his medications as directed.  He was admitted to the hospital for further management of VT.  Hospital Course     Consultants: EP- Dr. Cristopher Peru  Per Dr. Claris Gladden note:  1.RecurrentVTwith hypotension and low flow alarms on VAD: Underwent DC-CV 9/17, 08/17/17, and 04/01/18. S/p emergent DC-CV in ER 07/09/18.  Had no VT on combination sotalol/amiodarone until this admission.  Was admitted 11/07/18 with VT and low flow alarms, unable to pace out with ATP and had DCCV.  IV amiodarone started. EP consulted, recommended reloading IV amiodarone for a couple days and continuing sotalol + amiodarone as it had been generally successful.   - Stop IV amiodarone, start amiodarone 200 mg bid.   - If he has breakthrough VT again, consider stopping sotalol and using quinidine.  2. 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 s/p previous driveline [id number]. No further pump stops. Harvest in 1/19 with mildly low output. Started on milrinone without benefitso it was stopped. He has significant RV failure, requires midodrine and fludrocortison. NYHA III symptoms in setting of treatment for recurrent bladder tumor. Mild volume overload on exam.  - Avoid Lasix as comfortable and also requires midodrine and fludrocortisone.  3. Recurrent high-grade bladder tumor: S/p stone removal. 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 Had 2 BCG infusions with Dr. Gloriann Loan but discontinued due to inability to tolerate.  S/p repeat resection 7/19. s/p repeat bladder tumor excision in 11/19 with fast-growing tumor. C/b bilateral hydronephrosis and persistent hematuria requiring XRT. - Undergoing palliative  XRT with Dr. Tammi Klippel. Next XRT planned for Monday.   4. CKD, stage III: Had AKI in setting of obstructive uropathy and ATN. Required HD x1(11/2017). Creatinine stable at 1.46.  -Not interested in nephrostomy tubes for hydronephrosis 5. LVAD:Speed decreased to 8400 initially with VT.  VAD interrogated personally. Parameters stable, had 1 low flow since cardioversion. LDH 166.  - INR 2.65. Dosing per Pharm-D.  6. GIB: s/p clipping of 2 jejunal AVMs in 4/16. Continue coumadin and plavix.(with h/o CVA). Admitted 4/18 with recurrent GIB. Transfused 2u. EGD/colon negative. Admitted 12/18. hgb 6.5, Transfused 5u. EGD with 2 AVMs treated with APC. 10/24/18 EGD showedone non-bleeding AVM with APC.  10/25/18 Capsule with 2 clear AVMs noted, one proximal duodenum and the other suspected jejunum. Refuses Octreotide due to cost.  Hgb9.2 (stable). He denies black stools.  7. Atrial arrhythmias: Atrial fibrillation, atrial tachycardia. s/p multiple DC-CVs last 07/02/18. 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  - Continueamiodarone and sotalol.  8. HTN:MAP stable.   9. Anticoagulation management:  Goal INR 2-2.5. INR 2.65 today. Dosing per pharm D.  10. Lumbar compression fracture:S/p L3-L5 fusion 02/03/16. - Stable.  11. 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 S/p R CEA 01/13/16. Plavix stopped 12/18 due to recurrent GIB. - Continue atorvastatin (andwarfarin INR goal 2-2.5). 12. H/o amio-induced hypethyroidism: Followed by Dr. Forde Dandy in past.  - Amio restarted due to recurrent atrial arrhythmias and VT. 13. Disposition:  He can go home today after 48 hr loading of IV amiodarone.  He will continue meds as he was doing prior to admission (amiodarone will remain 200 mg bid).  He will need followup in LVAD clinic.  He has to be at [facility] for radiation tomorrow.   I reviewed the LVAD parameters from today, and compared the results to the patient's prior recorded data.  No programming changes were made.  The  LVAD is functioning within specified parameters.  The patient performs LVAD self-test daily.  LVAD interrogation was negative for any significant power changes, alarms or PI events/speed drops.  LVAD equipment check completed and is in good working order.  Back-up equipment present.   LVAD education done on emergency procedures and precautions and reviewed exit site care.   Patient has been seen by Dr. Aundra Dubin today and deemed ready for discharge home. All follow up appointments have been scheduled. Discharge medications are listed below.  _____________  Discharge Vitals Blood pressure (!) 113/99, pulse 87, temperature 98.9 F (37.2 C), temperature source Oral, resp. rate 15, weight 80.5 kg, SpO2 97 %.  Filed Weights   11/08/18 0400 11/09/18 0500  Weight: 81.6 kg 80.5 kg    Labs & Radiologic Studies    CBC Recent Labs    11/08/18 0343 11/09/18 0216  WBC 4.4 4.5  HGB 8.9* 9.2*  HCT 28.8* 28.5*  MCV 102.9* 102.9*  PLT 121* 035*   Basic Metabolic Panel Recent Labs    11/07/18 0918 11/08/18 0343 11/09/18 0216  NA 136 137 138  K 3.3* 3.3* 4.1  CL 105 108 110  CO2 23 22 22   GLUCOSE 121* 90 98  BUN 39* 29* 26*  CREATININE 1.56* 1.36* 1.46*  CALCIUM 8.7* 8.4* 8.6*  MG 2.1  --   --    Liver Function Tests No results for input(s): AST, ALT, ALKPHOS, BILITOT, PROT, ALBUMIN in the last  72 hours. No results for input(s): LIPASE, AMYLASE in the last 72 hours. Cardiac Enzymes No results for input(s): CKTOTAL, CKMB, CKMBINDEX, TROPONINI in the last 72 hours. BNP Invalid input(s): POCBNP D-Dimer No results for input(s): DDIMER in the last 72 hours. Hemoglobin A1C No results for input(s): HGBA1C in the last 72 hours. Fasting Lipid Panel No results for input(s): CHOL, HDL, LDLCALC, TRIG, CHOLHDL, LDLDIRECT in the last 72 hours. Thyroid Function Tests No results for input(s): TSH, T4TOTAL, T3FREE, THYROIDAB in the last 72 hours.  Invalid input(s): FREET3 _____________  No  results found. Disposition   Pt is being discharged home today in good condition.  Follow-up Plans & Appointments    Follow-up Information     [facility] Follow up.   Specialty:  Cardiology Why:  You will be called by the office on Monday to schedule a hospital follow-up appointment. Contact information: [address] [id number] [place] [phone]         Discharge Instructions    Diet - low sodium heart healthy   Complete by:  As directed    Discharge instructions   Complete by:  As directed    Use lasix sparingly, only as needed.   Increase activity slowly   Complete by:  As directed       Discharge Medications   Allergies as of 11/09/2018      Reactions   Demerol [meperidine] Other (See Comments)   Paralysis/ Could only move eyes   Coconut Oil Hives      Medication List    TAKE these medications   amiodarone 200 MG tablet Commonly known as:  PACERONE Take 1 tablet (200 mg total) by mouth 2 (two) times daily.   atorvastatin 80 MG tablet Commonly known as:  LIPITOR Take 1 tablet (80 mg total) by mouth at bedtime.   docusate sodium 100 MG capsule Commonly known as:  COLACE Take 100 mg by mouth daily.   dronabinol 2.5 MG capsule Commonly known as:  MARINOL Take 1 capsule (2.5 mg total) by mouth 2 (two) times daily before lunch and supper.   fludrocortisone 0.1 MG tablet Commonly known as:  FLORINEF Take 1 tablet (0.1 mg total) by mouth 2 (two) times daily.   furosemide 20 MG tablet Commonly known as:  LASIX Take 20 mg by mouth daily as needed for fluid.   ICAPS PO Take 1 tablet by mouth 2 (two) times daily.   levothyroxine 150 MCG tablet Commonly known as:  SYNTHROID, LEVOTHROID Take 1 tablet (150 mcg total) by mouth daily before breakfast.   midodrine 10 MG tablet Commonly known as:  PROAMATINE Take 1 tablet (10 mg total) by mouth 3 (three) times daily with  meals.   multivitamin with minerals Tabs tablet Take 1 tablet by mouth daily.   pantoprazole 40 MG tablet Commonly known as:  PROTONIX TAKE 1 TABLET TWICE DAILY What changed:  when to take this   promethazine 12.5 MG tablet Commonly known as:  PHENERGAN Take 12.5 mg by mouth every 6 (six) hours as needed for nausea or vomiting.   protein supplement shake Liqd Commonly known as:  PREMIER PROTEIN Take 11 oz by mouth 2 (two) times daily.   sotalol 80 MG tablet Commonly known as:  BETAPACE Take 1 tablet (80 mg total) by mouth 2 (two) times daily.   temazepam 30 MG capsule Commonly known as:  RESTORIL Take 1 capsule (30 mg total) by mouth at  bedtime as needed for sleep.   warfarin 2 MG tablet Commonly known as:  COUMADIN Take as directed. If you are unsure how to take this medication, talk to your nurse or doctor. Original instructions:  Take 2 mg daily, further as directed by VAD clinic. What changed:    how much to take  how to take this  when to take this  additional instructions   zinc gluconate 50 MG tablet Take 50 mg by mouth 2 (two) times daily.        Acute coronary syndrome (MI, NSTEMI, STEMI, etc) this admission?: No.    Outstanding Labs/Studies   Monitor potassium.  Duration of Discharge Encounter   Greater than 30 minutes including physician time.  Signed, Daune Perch, NP 11/09/2018, 8:47 AM

## 2018-11-10 ENCOUNTER — Ambulatory Visit
Admission: RE | Admit: 2018-11-10 | Discharge: 2018-11-10 | Disposition: A | Discharge status: Home / Self Care | Payer: Medicare Other | Source: Ambulatory Visit | Attending: Radiation Oncology | Admitting: Radiation Oncology

## 2018-11-10 ENCOUNTER — Ambulatory Visit: Payer: Medicare Other

## 2018-11-10 ENCOUNTER — Encounter (HOSPITAL_COMMUNITY): Payer: Self-pay | Admitting: Cardiology

## 2018-11-10 DIAGNOSIS — Z510 Encounter for antineoplastic radiation therapy: Secondary | ICD-10-CM | POA: Diagnosis not present

## 2018-11-10 DIAGNOSIS — C679 Malignant neoplasm of bladder, unspecified: Secondary | ICD-10-CM | POA: Diagnosis not present

## 2018-11-11 ENCOUNTER — Ambulatory Visit (HOSPITAL_COMMUNITY): Payer: Self-pay | Admitting: Pharmacist

## 2018-11-11 ENCOUNTER — Ambulatory Visit
Admission: RE | Admit: 2018-11-11 | Discharge: 2018-11-11 | Disposition: A | Discharge status: Home / Self Care | Payer: Medicare Other | Source: Ambulatory Visit | Attending: Radiation Oncology | Admitting: Radiation Oncology

## 2018-11-11 ENCOUNTER — Ambulatory Visit: Payer: Medicare Other

## 2018-11-11 ENCOUNTER — Ambulatory Visit: Payer: Self-pay | Admitting: Urology

## 2018-11-11 DIAGNOSIS — C679 Malignant neoplasm of bladder, unspecified: Secondary | ICD-10-CM | POA: Diagnosis not present

## 2018-11-11 LAB — POCT INR: INR: 2.5 (ref 2.0–3.0)

## 2018-11-12 ENCOUNTER — Ambulatory Visit: Payer: Medicare Other

## 2018-11-12 ENCOUNTER — Ambulatory Visit
Admission: RE | Admit: 2018-11-12 | Discharge: 2018-11-12 | Disposition: A | Discharge status: Home / Self Care | Payer: Medicare Other | Source: Ambulatory Visit | Attending: Radiation Oncology | Admitting: Radiation Oncology

## 2018-11-12 DIAGNOSIS — C679 Malignant neoplasm of bladder, unspecified: Secondary | ICD-10-CM | POA: Diagnosis not present

## 2018-11-13 ENCOUNTER — Telehealth (HOSPITAL_COMMUNITY): Payer: Self-pay | Admitting: Unknown Physician Specialty

## 2018-11-13 ENCOUNTER — Ambulatory Visit
Admission: RE | Admit: 2018-11-13 | Discharge: 2018-11-13 | Disposition: A | Discharge status: Home / Self Care | Payer: Medicare Other | Source: Ambulatory Visit | Attending: Radiation Oncology | Admitting: Radiation Oncology

## 2018-11-13 ENCOUNTER — Ambulatory Visit: Payer: Medicare Other

## 2018-11-13 DIAGNOSIS — C679 Malignant neoplasm of bladder, unspecified: Secondary | ICD-10-CM | POA: Diagnosis not present

## 2018-11-13 NOTE — Telephone Encounter (Signed)
Received call from pt with concerns of IV infiltrate site in right hand from recent hospital stay. Pt was instructed to apply warm compresses to hand. Reviewed with Darrick Grinder, NP.    Pt was instructed to call if site becomes worse.  Tanda Rockers RN, BSN VAD Coordinator 24/7 Pager [phone]

## 2018-11-14 ENCOUNTER — Ambulatory Visit
Admission: RE | Admit: 2018-11-14 | Discharge: 2018-11-14 | Disposition: A | Discharge status: Home / Self Care | Payer: Medicare Other | Source: Ambulatory Visit | Attending: Radiation Oncology | Admitting: Radiation Oncology

## 2018-11-14 DIAGNOSIS — C679 Malignant neoplasm of bladder, unspecified: Secondary | ICD-10-CM | POA: Diagnosis not present

## 2018-11-16 ENCOUNTER — Telehealth (HOSPITAL_COMMUNITY): Payer: Self-pay | Admitting: *Deleted

## 2018-11-17 ENCOUNTER — Telehealth (HOSPITAL_COMMUNITY): Payer: Self-pay | Admitting: *Deleted

## 2018-11-17 ENCOUNTER — Other Ambulatory Visit (HOSPITAL_COMMUNITY): Payer: Self-pay | Admitting: Unknown Physician Specialty

## 2018-11-17 ENCOUNTER — Ambulatory Visit
Admission: RE | Admit: 2018-11-17 | Discharge: 2018-11-17 | Disposition: A | Discharge status: Home / Self Care | Payer: Medicare Other | Source: Ambulatory Visit | Attending: Radiation Oncology | Admitting: Radiation Oncology

## 2018-11-17 DIAGNOSIS — Z7901 Long term (current) use of anticoagulants: Secondary | ICD-10-CM

## 2018-11-17 DIAGNOSIS — C679 Malignant neoplasm of bladder, unspecified: Secondary | ICD-10-CM | POA: Diagnosis not present

## 2018-11-17 DIAGNOSIS — Z95811 Presence of heart assist device: Secondary | ICD-10-CM

## 2018-11-17 NOTE — Telephone Encounter (Signed)
Called [name] per Dr. Haroldine Laws to inform her the shock [name] received from his ICD was an appropriate shock for VT and that patient converted back to sinus with the intervention.  Pt feeling "ok" today other than "he has a cold". No further ICD shocks or VAD alarms. Pt has VAD clinic appt on Tues 11/18/2018 at 9:00 am. Asked [name] to call if pt needs to be seen sooner. She verbalized agreement to same.  Zada Girt RN, VAD Coordinator 24/7 VAD pager: [phone]

## 2018-11-17 NOTE — Telephone Encounter (Signed)
[name] called VAD pager to report patient experienced a "shock" from his ICD. Says he was sitting when he suddenly received a shock.   He now feels "washed out"; no VAD alarms.  Dr. Haroldine Laws updated, asked pt to send home transmission of his ICD.  Othelia Pulling to call VAD pager if pt receives 2nd shock, he will need to come to [facility] ED.  wife verbalized agreement to above.   Zada Girt RN, VAD Coordinator 24/7 VAD Pager: [phone]

## 2018-11-17 NOTE — Telephone Encounter (Signed)
The beginning and end EGMs of VT episode  .Pt received 8 bursts of ATP in between.

## 2018-11-18 ENCOUNTER — Encounter (HOSPITAL_COMMUNITY): Payer: Self-pay

## 2018-11-18 ENCOUNTER — Ambulatory Visit (HOSPITAL_COMMUNITY): Payer: Self-pay | Admitting: Pharmacist

## 2018-11-18 ENCOUNTER — Ambulatory Visit
Admission: RE | Admit: 2018-11-18 | Discharge: 2018-11-18 | Disposition: A | Discharge status: Home / Self Care | Payer: Medicare Other | Source: Ambulatory Visit | Attending: Radiation Oncology | Admitting: Radiation Oncology

## 2018-11-18 ENCOUNTER — Ambulatory Visit (HOSPITAL_COMMUNITY)
Admission: RE | Admit: 2018-11-18 | Discharge: 2018-11-18 | Disposition: A | Discharge status: Home / Self Care | Payer: Medicare Other | Source: Ambulatory Visit | Attending: Internal Medicine | Admitting: Internal Medicine

## 2018-11-18 VITALS — BP 102/0 | HR 80 | Ht 72.0 in | Wt 190.2 lb

## 2018-11-18 DIAGNOSIS — G4730 Sleep apnea, unspecified: Secondary | ICD-10-CM | POA: Insufficient documentation

## 2018-11-18 DIAGNOSIS — Z955 Presence of coronary angioplasty implant and graft: Secondary | ICD-10-CM | POA: Diagnosis not present

## 2018-11-18 DIAGNOSIS — I440 Atrioventricular block, first degree: Secondary | ICD-10-CM | POA: Insufficient documentation

## 2018-11-18 DIAGNOSIS — I5022 Chronic systolic (congestive) heart failure: Secondary | ICD-10-CM

## 2018-11-18 DIAGNOSIS — I5023 Acute on chronic systolic (congestive) heart failure: Secondary | ICD-10-CM | POA: Insufficient documentation

## 2018-11-18 DIAGNOSIS — I480 Paroxysmal atrial fibrillation: Secondary | ICD-10-CM | POA: Insufficient documentation

## 2018-11-18 DIAGNOSIS — I130 Hypertensive heart and chronic kidney disease with heart failure and stage 1 through stage 4 chronic kidney disease, or unspecified chronic kidney disease: Secondary | ICD-10-CM | POA: Diagnosis not present

## 2018-11-18 DIAGNOSIS — Z95811 Presence of heart assist device: Secondary | ICD-10-CM | POA: Diagnosis not present

## 2018-11-18 DIAGNOSIS — Z79899 Other long term (current) drug therapy: Secondary | ICD-10-CM | POA: Insufficient documentation

## 2018-11-18 DIAGNOSIS — I255 Ischemic cardiomyopathy: Secondary | ICD-10-CM | POA: Diagnosis not present

## 2018-11-18 DIAGNOSIS — E039 Hypothyroidism, unspecified: Secondary | ICD-10-CM | POA: Diagnosis not present

## 2018-11-18 DIAGNOSIS — I2510 Atherosclerotic heart disease of native coronary artery without angina pectoris: Secondary | ICD-10-CM | POA: Diagnosis not present

## 2018-11-18 DIAGNOSIS — M4856XA Collapsed vertebra, not elsewhere classified, lumbar region, initial encounter for fracture: Secondary | ICD-10-CM | POA: Diagnosis not present

## 2018-11-18 DIAGNOSIS — Z95810 Presence of automatic (implantable) cardiac defibrillator: Secondary | ICD-10-CM | POA: Insufficient documentation

## 2018-11-18 DIAGNOSIS — I472 Ventricular tachycardia, unspecified: Secondary | ICD-10-CM

## 2018-11-18 DIAGNOSIS — Z85820 Personal history of malignant melanoma of skin: Secondary | ICD-10-CM | POA: Insufficient documentation

## 2018-11-18 DIAGNOSIS — I471 Supraventricular tachycardia: Secondary | ICD-10-CM | POA: Diagnosis not present

## 2018-11-18 DIAGNOSIS — Z8673 Personal history of transient ischemic attack (TIA), and cerebral infarction without residual deficits: Secondary | ICD-10-CM | POA: Insufficient documentation

## 2018-11-18 DIAGNOSIS — N183 Chronic kidney disease, stage 3 unspecified: Secondary | ICD-10-CM

## 2018-11-18 DIAGNOSIS — E876 Hypokalemia: Secondary | ICD-10-CM

## 2018-11-18 DIAGNOSIS — E785 Hyperlipidemia, unspecified: Secondary | ICD-10-CM | POA: Diagnosis not present

## 2018-11-18 DIAGNOSIS — C679 Malignant neoplasm of bladder, unspecified: Secondary | ICD-10-CM | POA: Diagnosis not present

## 2018-11-18 DIAGNOSIS — I252 Old myocardial infarction: Secondary | ICD-10-CM | POA: Diagnosis not present

## 2018-11-18 DIAGNOSIS — Z7902 Long term (current) use of antithrombotics/antiplatelets: Secondary | ICD-10-CM | POA: Insufficient documentation

## 2018-11-18 DIAGNOSIS — Z7901 Long term (current) use of anticoagulants: Secondary | ICD-10-CM | POA: Diagnosis not present

## 2018-11-18 DIAGNOSIS — D494 Neoplasm of unspecified behavior of bladder: Secondary | ICD-10-CM | POA: Diagnosis not present

## 2018-11-18 DIAGNOSIS — Z8719 Personal history of other diseases of the digestive system: Secondary | ICD-10-CM

## 2018-11-18 DIAGNOSIS — Z79890 Hormone replacement therapy (postmenopausal): Secondary | ICD-10-CM | POA: Insufficient documentation

## 2018-11-18 LAB — PROTIME-INR
INR: 3
Prothrombin Time: 30.7 seconds — ABNORMAL HIGH (ref 11.4–15.2)

## 2018-11-18 LAB — CBC
HCT: 32.1 % — ABNORMAL LOW (ref 39.0–52.0)
Hemoglobin: 10.3 g/dL — ABNORMAL LOW (ref 13.0–17.0)
MCH: 33.6 pg (ref 26.0–34.0)
MCHC: 32.1 g/dL (ref 30.0–36.0)
MCV: 104.6 fL — ABNORMAL HIGH (ref 80.0–100.0)
Platelets: 202 10*3/uL (ref 150–400)
RBC: 3.07 MIL/uL — ABNORMAL LOW (ref 4.22–5.81)
RDW: 20.6 % — ABNORMAL HIGH (ref 11.5–15.5)
WBC: 5.3 10*3/uL (ref 4.0–10.5)
nRBC: 0.4 % — ABNORMAL HIGH (ref 0.0–0.2)

## 2018-11-18 LAB — BASIC METABOLIC PANEL
Anion gap: 9 (ref 5–15)
BUN: 19 mg/dL (ref 8–23)
CO2: 25 mmol/L (ref 22–32)
Calcium: 8.7 mg/dL — ABNORMAL LOW (ref 8.9–10.3)
Chloride: 103 mmol/L (ref 98–111)
Creatinine, Ser: 1.45 mg/dL — ABNORMAL HIGH (ref 0.61–1.24)
GFR calc non Af Amer: 45 mL/min — ABNORMAL LOW (ref >60)
GFR, EST AFRICAN AMERICAN: 53 mL/min — AB (ref >60)
Glucose, Bld: 105 mg/dL — ABNORMAL HIGH (ref 70–99)
Potassium: 3 mmol/L — ABNORMAL LOW (ref 3.5–5.1)
Sodium: 137 mmol/L (ref 135–145)

## 2018-11-18 LAB — LACTATE DEHYDROGENASE: LDH: 226 U/L — ABNORMAL HIGH (ref 98–192)

## 2018-11-18 MED ORDER — POTASSIUM CHLORIDE CRYS ER 20 MEQ PO TBCR: 20.0000 meq | EXTENDED_RELEASE_TABLET | Freq: Every day | ORAL | 3 refills | Status: DC

## 2018-11-18 MED ORDER — SPIRONOLACTONE 25 MG PO TABS: 12.5000 mg | ORAL_TABLET | Freq: Every day | ORAL | 3 refills | Status: AC

## 2018-11-18 NOTE — Progress Notes (Signed)
Patient presents for  in [facility] today with caregiver, [name]. Pt received a shock from his device on Saturday x 1. Pt was sitting in the recliner reading the newspaper when his device went off.  Full interrogation on Medtronic device today. Pt had a fever the day he was discharged. [name] states that when she got him home he was 102 oral. Pt also had an IV infiltration on the right hand that was noted in a previous telephone note.This area looks much better today. It is slightly red but no tenderness. Approximately 3 x 2.   Vital Signs:   Doppler Pressure: 102 Automatc BP:  116/94(105) HR: 80 SPO2: 97%  Weight: 190.2 lb w/o eqt Last weight: 180.2 lb   VAD Indication: Destination Therapy- age excluding    VAD interrogation & Equipment Management: Speed: 8400 Flow:  4.1 Power: 4.1w    PI: 5.4  Alarms: 3-4 LF daily w/ outliers 1/3-1/5 during recent hospitalization  Events: 3-5 daily  Fixed speed 8400 Low speed limit: 8000  Primary Controller: Replace back up battery in 22 months Back up controller: Replace back up battery in 72months  Exit Site Care: Drive line is being maintained weekly by [name]. Drive line exit site well healed and incorporated. The velour is fully implanted at exit site. Dressing dry and intact. No erythema or drainage. Stabilization device present and accurately applied. Pt denies fever or chills. Pt given 4 dressing kits today home.   Significant Events on VAD Support:  02/2015 >>GIB (removed ASA, 2-2.5 INR) 10/2015 >>CVA(Plavix started) 02/2017> GIB (removed ASA, scopes negative) 03/2017> percutaneous lead repair 12/2017> Bladder CA  Device:Medtronic Therapies: on 230 bpm AT monitor: 118-230 bpm Last check: today-Brandon over to reprogram with above settings per DR. Bensimhon   BP & Labs:  MAP 102 - Doppler is reflecting MAP  Hgb 10.3 - Denies any signs/symptoms of bleeding or nosebleeds.   LDH 226  - with established baseline of  280-380. Denies tea-colored urine. No power elevations noted on interrogation.    Patient Instructions: 1. Hold Coumadin tonight and then resume a dose of 1 mg daily. 2. Start Potassium Chloride 40 mEq for 4 days and then resume a dose of 20 mEq daily. 3. Start Spironolactone 12.5 mg daily (1/2 tablet). 4. Return to clinic for labs on Monday. 5. Return to clinic in 3-4 wks.    Tanda Rockers RN [place] Coordinator  Office: [phone]  24/7 Pager: [phone]

## 2018-11-18 NOTE — Patient Instructions (Signed)
1. Hold Coumadin tonight and then resume a dose of 1 mg daily. 2. Start Potassium Chloride 40 mEq for 4 days and then resume a dose of 20 mEq daily. 3. Start Spironolactone 12.5 mg daily (1/2 tablet). 4. Return to clinic for labs on Monday. 5. Return to clinic in 3-4 wks.

## 2018-11-18 NOTE — Progress Notes (Signed)
Patient ID: [name], male   DOB: 05-02-1941, 78 y.o.   MRN: [id number]   VAD CLINIC PROGRESS NOTE  PCP: Dr. Kandice Robinsons ([place]) GU: Dr Gloriann Loan Neuro Surgeon: Dr Vertell Limber Vascular: Dr Kellie Simmering Primary HF: Dr Darlyn Read [name] is a 78 y.o. male  with h/o VT, PAD and severe systolic HF (EF 12-87%) due to mixed ischemic/nonischemic CM he is s/p HM II LVAD implant for destination therapy on October 19, 2013.VAD implantation was complicated by VT, syncope, AF/Aflutter and RHF. Required short term milrinone.   GU Event 09/01/14 had impacted ureteral stone and underwent cystoscopy and flexible ureteroscopy with lithotripsy and basket extraction.  09/2015 Hematuria and chronic nephrolithiasis. Renal US showed mild hyronephrosis and bilateral renal cysts. Dr. Risa Grill took back for repeat lithotripsy and stone extraction by ureteroscopy and J stent placement. 12/18 Recurrent hematuria. 23mm left ureteral stone. Passed with tamulosin 1/19   Admitted with recurrent hematuria with ureteral stone. Stone removed but found to have large bladder mass which was removed. Course c/b obstructive uropathy and AKI requiring 1 session of HD.  3/19 Bladder chemo stopped after 2 treatments as unable to tolerate 05/16/18 s/p TURB with right ureteral stent placement.  09/2018 Recurrent bladder tumor excision with persistent hematuria requiring XRT  GI Event  02/2015 - Hgb 5.4 --> EGD that showed 2 AVMs in the jejunum that were clipped.  4/2-18 - Episode of melena. Hgb 10.9-> 8.7. EGD/colonoscopy negative 12/18 - Melena with syncope  Hgb 6.5. Transfused 5u. EGD with 2 AVMs with APC 10/24/18 EGD showed one non-bleeding AVM with APC.  10/25/18 Capsule results delayed due to software upgrade and holiday.    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  01/2016 R CEA. He was started on Plavix.  01/2016 Lumbar fusion L3-L5 with pedicle screws posterolateral arthrodesis with BMP, allograft  VT events 9/17  with high fevers and productive cough. Treated with levaquin x 7 days for acute bronchitis. Also found to have VT and underwent DC-CV. Seen by EP and amio increased 10/17 speed turned down to 8800 due to RV failure. 10/18 with slow VT and ADHF.Cardioverted in ED and amio uptitrated.  04/01/18 recurrent VT. Underwent DC-CV in ErR9/4/19 presented to ED. Urgently cardioverted. EP started on Sotolol.  11/07/18 Recurrent VT - DC-CV in ER  Significant VAD Events  03/2017>percutaneous lead repair  Hospitalized 09/17/18-10/01/18 for heparin bridge for recurrent bladder tumor resection on 09/19/18. Renal US showed recurrent right hydronephrosis without BOO and evidence of recurrent bladder tumor. Underwent recurrent bladder tumor resection 09/19/18 with Dr Gloriann Loan.Found to be large fast-growing tumor with obstruction of right ureteral orifice (unable to uncover right ureteral orifice). Repeat renal US showed slightly improved right hydronephrosis and new left mild hydronephrosis. Urology offered nephrostomy tube, but he declined. CT  abdomen completed and showed bilateral hydronephrosis L>R and was negative for locally invasive disease. 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   He had urinary retention so Foley cath placed. This was later removed but he had catheter placed again. He had large clots so he started continuous irrigation. Dr Gloriann Loan and Dr Tammi Klippel recommended a few days on radiation due to ongoing hematuria. He under went radiation on 11/25, 11/26, and 11/27. Hemoglobin dropped down to 7.8 and Hematuria resolved so foley was removed and he was able to void. He will follow up with Dr Tammi Klippel in January.   Ramp echo done 11/14 and speed turned down to 8600. He  had intermittent low flows and he was given IV fluids. He has been instructed to continue to drink gatorade because this has been an ongoing problem. INR was followed closely. INR therapeutic  on the day of discharge. PICC line was remove on the day of discharge.   Admitted 12/18 - 10/28/18 from home with low flows and malaise. Found to be anemic. Given 1 uPRBCs, GI consulted for EGD which showed one non-bleeding AVM with APC. Capsule with 2 clear AVMs noted, one proximal duodenum and the other suspected jejunum. Pt continued to have intermittent low flows 12/23 and 10/28/18. Florinef added 10/26/18. Midodrine added 10/27/18.   Results of capsule endo showed:prolonged small bowel transit of 9 hours and 44 minutes.  2 clear AVMs noted, one proximal duodenum, the other mid jejunum (suspected). No active bleeding or other pathology noted otherwise. Suspect bleeding is due to small bowel AVMs. Recommend serial Hgb, consideration for octreotide if bleeding recurs in the future. Keep INR at lowest level acceptable for LVAD.   Was admitted 1/3-11/09/18 with recurrent VT.   He presents today for post-hospital f/u. On 1/12 received ICD shock at home. Since that time says he has been feeling OK. Fatigued but no further shocks. Can do ADLs without too much difficulty. Not taking lasix. Drinking lots of fluids to avoid low flows.  Weight up about 10 pounds. Denies orthopnea or PND. No fevers, chills or problems with driveline. No bleeding, melena or neuro symptoms. NTaking all meds as prescribed.    VAD Indication: Destination Therapy- age excluding    VAD interrogation & Equipment Management: Speed: 8400 Flow:  4.1 Power: 4.1w PI: 5.4  Alarms: 3-4 LF daily w/ outliers 1/3-1/5 during recent hospitalization  Events: 3-5 daily  Fixed speed 8400 Low speed limit: 8000   Past Medical History:  Diagnosis Date  . Arthritis   . Automatic implantable cardioverter-defibrillator in situ    a. s/p Medtronic Evera device serial [id number]    . Bradycardia   . CAD (coronary artery disease)    a. s/p MI 1982;  s/p DES to CFX 2011;  s/p NSTEMI 4/12 in setting of VTach;  cath 7/12:  LM ok, LAD  mid 30-40%, Dx's with 40%, mCFX stent patent, prox to mid RCA occluded with R to R and L to R collats.  Medical Tx was continued  . Chronic systolic heart failure ([place])    a. s/p LVAD 10/2013 for destination therapy  . CKD (chronic kidney disease)   . CVD (cerebrovascular disease)    a. s/p L CEA 2008  . Cytopenia   . Dysrhythmia    AFIB  . History of GI bleed    a. on ASA- started on plavix during 10/2015 admission for CVA  . HLD (hyperlipidemia)   . HTN (hypertension)   . Hypothyroidism   . Inappropriate therapy from implantable cardioverter-defibrillator    a. ATP for sinus tach SVT wavelet identified SVT but was no  passive (2014)  . Ischemic cardiomyopathy   . 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 L Ear   . Myocardial infarction ([place])    1992  . PAF (paroxysmal atrial fibrillation) ([place])   . PVD (peripheral vascular disease) ([place])    a. 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 Shortness of breath dyspnea    W/ EXERTION   .  Sleep apnea    NO CPAP NOW  HE SENT IT BACK YRS AGO  . Stroke [facility])    a. 10/2015 admission   . Ventricular tachycardia ([place])    a. s/p AICD;   h/o ICD shock;  Amiodarone Rx. S/P ablation in 2012    Current Outpatient Medications  Medication Sig Dispense Refill  . amiodarone (PACERONE) 200 MG tablet Take 1 tablet (200 mg total) by mouth 2 (two) times daily. 120 tablet 5  . atorvastatin (LIPITOR) 80 MG tablet Take 1 tablet (80 mg total) by mouth at bedtime. 90 tablet 1  . docusate sodium (COLACE) 100 MG capsule Take 100 mg by mouth daily.     . fludrocortisone (FLORINEF) 0.1 MG tablet Take 1 tablet (0.1 mg total) by mouth 2 (two) times daily. 60 tablet 6  . levothyroxine (SYNTHROID, LEVOTHROID) 150 MCG tablet Take 1 tablet (150 mcg total) by mouth daily before breakfast. 90 tablet 3  . midodrine (PROAMATINE) 10 MG tablet Take 1 tablet (10 mg total) by mouth 3 (three)  times daily with meals. 90 tablet 3  . Multiple Vitamin (MULTIVITAMIN WITH MINERALS) TABS tablet Take 1 tablet by mouth daily.    . Multiple Vitamins-Minerals (ICAPS PO) Take 1 tablet by mouth 2 (two) times daily.    . pantoprazole (PROTONIX) 40 MG tablet TAKE 1 TABLET TWICE DAILY (Patient taking differently: Take 40 mg by mouth 2 (two) times daily before a meal. ) 180 tablet 6  . protein supplement shake (PREMIER PROTEIN) LIQD Take 11 oz by mouth once.     . sotalol (BETAPACE) 80 MG tablet Take 1 tablet (80 mg total) by mouth 2 (two) times daily. 60 tablet 6  . temazepam (RESTORIL) 30 MG capsule Take 1 capsule (30 mg total) by mouth at bedtime as needed for sleep. 30 capsule 3  . warfarin (COUMADIN) 2 MG tablet Take 2 mg daily, further as directed by VAD clinic. (Patient taking differently: Take 1 mg by mouth See admin instructions. 1mg  daily)    . dronabinol (MARINOL) 2.5 MG capsule Take 1 capsule (2.5 mg total) by mouth 2 (two) times daily before lunch and supper. (Patient not taking: Reported on 11/04/2018) 60 capsule 3  . furosemide (LASIX) 20 MG tablet Take 20 mg by mouth daily as needed for fluid.     . potassium chloride SA (K-DUR,KLOR-CON) 20 MEQ tablet Take 1 tablet (20 mEq total) by mouth daily. 90 tablet 3  . promethazine (PHENERGAN) 12.5 MG tablet Take 12.5 mg by mouth every 6 (six) hours as needed for nausea or vomiting.    [name] spironolactone (ALDACTONE) 25 MG tablet Take 0.5 tablets (12.5 mg total) by mouth daily. 90 tablet 3  . zinc gluconate 50 MG tablet Take 50 mg by mouth 2 (two) times daily.      No current facility-administered medications for this encounter.     Demerol [meperidine] and Coconut oil  Review of systems complete and found to be negative unless listed in HPI.    Vitals:   11/18/18 0905 11/18/18 0906  BP: (!) 116/94 (!) 102/0  Pulse: 80   SpO2: 97%   Weight: 86.3 kg (190 lb 3.2 oz)   Height: 6' (1.829 m)     Physical exam: General:  NAD.  HEENT:  normal  Neck: supple. JVP to jawCarotids 2+ bilat; no bruits. No lymphadenopathy or thryomegaly appreciated. Cor: LVAD hum.  Lungs: Clear. Abdomen: obese soft, nontender, non-distended. No hepatosplenomegaly. No bruits or masses. Good bowel  sounds. Driveline site clean. Anchor in place.  Extremities: no cyanosis, clubbing, rash. Warm 1-2+ edema  Neuro: alert & oriented x 3. No focal deficits. Moves all 4 without problem     ASSESSMENT AND PLAN:  1.RecurrentVTwith hypotension and low flow alarms on VAD: Underwent DC-CV 9/17, 08/17/17, and 04/01/18.  S/p emergent DC-CV in ER 07/09/18. S/p DC-CV in ER on 1/3 - ICD interrogated personally in Clinic. Recurrent VT on 1/12 at rate ~200 bpm. Failed ATP which sped up VT to ~300 bpm and then was shocked x 1 - Device reprogrammed today to avoid shocks at home. VT zone 118-230 monitor only. No ATP. > 230 Shock - Will supp K - Continue amio and sotalol.  - Will likely need EP to weigh in again if VT continues - Keep K > 4.0 Mg > 2.0  2. 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 s/p previous driveline [id number]. No further pump stops. [place] in 1/19 with mildly low output. Started on milrinone without benefitso it was stopped.  - We have started florinef and have been pushing fluids to avoid low flows.  - Now volume overloaded. Weight up 10 pounds but he is hesitant to take lasix with low flows - Start spiro. Take lasix if weight not coming down  - Stable NYHA III symptoms  - VAD speed turned down to 8400  3. Recurrent high-grade bladder tumor:  - S/p stone removal.  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  Had 2 BCG infusions with Dr. Gloriann Loan but discontinued due to inability to tolerate. - S/p repeat resection 7/19.  - s/p repeat bladder tumor excision in 11/19 with fast-growing tumor. C/b bilateral hydronephrosis and persistent hematuria requiring XRT. - Undergoing palliative XRT with Dr. Tammi Klippel. Denies hematuria.     4. CKD, stage III:  Had AKI in setting of obstructive uropathy and ATN. Required HD x1(11/2017).   - Cr 1.45 today.  -Not interested in nephrostomy tubes for hydronephrosis  5. LVAD:  - VAD interrogated personally. Parameters stable. - VAD speed turned down to 8400 previously to help with low flows - INR 3.00 Try to keep INR closer to 2.0. Discussed dosing with PharmD personally. - LDH 225   6. GIB:  s/p clipping of 2 jejunal AVMs in 4/16. Continue coumadin and plavix.(with h/o CVA). Admitted 4/18 with recurrent GIB. Transfused 2u. EGD/colon negative. Admitted 12./18. hgb 6.5, Transfused 5u. EGD with 2 AVMs treated with APC.  - 10/24/18 EGD showed one non-bleeding AVM with APC.  - 10/25/18 Capsule with 2 clear AVMs noted, one proximal duodenum and the other suspected jejunum. Refuses Octreotide due to cost.  - Hgb 10.3. He denies black stools.   7. Atrial arrhythmias: Atrial fibrillation, atrial tachycardia. s/p multiple DC-CVs last 07/02/18.  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  - Continue amiodarone and sotalol.   8. HTN:  - MAPS stable despite low flows.   10. Lumbar compression fracture: S/p L3-L5 fusion 02/03/16. - Stable.   11. 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 S/p R CEA 01/13/16. Plavix stopped 12/18 due to recurrent GIB. - Continue atorvastatin (andwarfarin INR goal 2-2.5).  - No change to current plan.    12. H/o amio-induced hypethyroidism:  Followed by Dr. Forde Dandy in past.  - Amio restarted due to recurrent atrial arrhythmias and VT.  - Follow.   13. Hypokalemia - K 3.0 today. Arlyce Harman added. Take kdur 40 x 4 days then 20 daily. Repeat BMET Friday  Total time spent  45 minutes. Over half that time spent discussing above.    Glori Bickers, MD  5:48 PM

## 2018-11-19 ENCOUNTER — Ambulatory Visit
Admission: RE | Admit: 2018-11-19 | Discharge: 2018-11-19 | Disposition: A | Discharge status: Home / Self Care | Payer: Medicare Other | Source: Ambulatory Visit | Attending: Radiation Oncology | Admitting: Radiation Oncology

## 2018-11-19 DIAGNOSIS — C679 Malignant neoplasm of bladder, unspecified: Secondary | ICD-10-CM | POA: Diagnosis not present

## 2018-11-20 ENCOUNTER — Ambulatory Visit
Admission: RE | Admit: 2018-11-20 | Discharge: 2018-11-20 | Disposition: A | Discharge status: Home / Self Care | Payer: Medicare Other | Source: Ambulatory Visit | Attending: Radiation Oncology | Admitting: Radiation Oncology

## 2018-11-20 DIAGNOSIS — C679 Malignant neoplasm of bladder, unspecified: Secondary | ICD-10-CM | POA: Diagnosis not present

## 2018-11-21 ENCOUNTER — Other Ambulatory Visit (HOSPITAL_COMMUNITY): Payer: Self-pay | Admitting: Unknown Physician Specialty

## 2018-11-21 ENCOUNTER — Ambulatory Visit: Payer: Medicare Other

## 2018-11-21 ENCOUNTER — Inpatient Hospital Stay: Payer: Medicare Other | Attending: Oncology | Admitting: Oncology

## 2018-11-21 ENCOUNTER — Ambulatory Visit
Admission: RE | Admit: 2018-11-21 | Discharge: 2018-11-21 | Disposition: A | Discharge status: Home / Self Care | Payer: Medicare Other | Source: Ambulatory Visit | Attending: Radiation Oncology | Admitting: Radiation Oncology

## 2018-11-21 VITALS — BP 105/92 | HR 90 | Temp 97.5°F | Resp 18 | Ht 72.0 in | Wt 196.6 lb

## 2018-11-21 DIAGNOSIS — Z79899 Other long term (current) drug therapy: Secondary | ICD-10-CM

## 2018-11-21 DIAGNOSIS — Z7952 Long term (current) use of systemic steroids: Secondary | ICD-10-CM

## 2018-11-21 DIAGNOSIS — I5023 Acute on chronic systolic (congestive) heart failure: Secondary | ICD-10-CM | POA: Diagnosis present

## 2018-11-21 DIAGNOSIS — N170 Acute kidney failure with tubular necrosis: Secondary | ICD-10-CM | POA: Diagnosis present

## 2018-11-21 DIAGNOSIS — E785 Hyperlipidemia, unspecified: Secondary | ICD-10-CM | POA: Diagnosis present

## 2018-11-21 DIAGNOSIS — Z9842 Cataract extraction status, left eye: Secondary | ICD-10-CM

## 2018-11-21 DIAGNOSIS — Z95811 Presence of heart assist device: Secondary | ICD-10-CM

## 2018-11-21 DIAGNOSIS — Z95810 Presence of automatic (implantable) cardiac defibrillator: Secondary | ICD-10-CM

## 2018-11-21 DIAGNOSIS — R531 Weakness: Secondary | ICD-10-CM | POA: Diagnosis not present

## 2018-11-21 DIAGNOSIS — Z87891 Personal history of nicotine dependence: Secondary | ICD-10-CM

## 2018-11-21 DIAGNOSIS — C672 Malignant neoplasm of lateral wall of bladder: Secondary | ICD-10-CM

## 2018-11-21 DIAGNOSIS — Z9841 Cataract extraction status, right eye: Secondary | ICD-10-CM

## 2018-11-21 DIAGNOSIS — E039 Hypothyroidism, unspecified: Secondary | ICD-10-CM | POA: Diagnosis present

## 2018-11-21 DIAGNOSIS — Z7901 Long term (current) use of anticoagulants: Secondary | ICD-10-CM

## 2018-11-21 DIAGNOSIS — Z809 Family history of malignant neoplasm, unspecified: Secondary | ICD-10-CM

## 2018-11-21 DIAGNOSIS — R627 Adult failure to thrive: Secondary | ICD-10-CM | POA: Diagnosis not present

## 2018-11-21 DIAGNOSIS — Z823 Family history of stroke: Secondary | ICD-10-CM

## 2018-11-21 DIAGNOSIS — I2510 Atherosclerotic heart disease of native coronary artery without angina pectoris: Secondary | ICD-10-CM | POA: Diagnosis present

## 2018-11-21 DIAGNOSIS — Z8673 Personal history of transient ischemic attack (TIA), and cerebral infarction without residual deficits: Secondary | ICD-10-CM

## 2018-11-21 DIAGNOSIS — Z8249 Family history of ischemic heart disease and other diseases of the circulatory system: Secondary | ICD-10-CM

## 2018-11-21 DIAGNOSIS — Z85820 Personal history of malignant melanoma of skin: Secondary | ICD-10-CM

## 2018-11-21 DIAGNOSIS — I440 Atrioventricular block, first degree: Secondary | ICD-10-CM | POA: Diagnosis present

## 2018-11-21 DIAGNOSIS — I10 Essential (primary) hypertension: Secondary | ICD-10-CM | POA: Diagnosis not present

## 2018-11-21 DIAGNOSIS — N183 Chronic kidney disease, stage 3 (moderate): Secondary | ICD-10-CM | POA: Diagnosis present

## 2018-11-21 DIAGNOSIS — Z8349 Family history of other endocrine, nutritional and metabolic diseases: Secondary | ICD-10-CM

## 2018-11-21 DIAGNOSIS — Z8551 Personal history of malignant neoplasm of bladder: Secondary | ICD-10-CM

## 2018-11-21 DIAGNOSIS — Z79890 Hormone replacement therapy (postmenopausal): Secondary | ICD-10-CM

## 2018-11-21 DIAGNOSIS — Z6825 Body mass index (BMI) 25.0-25.9, adult: Secondary | ICD-10-CM

## 2018-11-21 DIAGNOSIS — I130 Hypertensive heart and chronic kidney disease with heart failure and stage 1 through stage 4 chronic kidney disease, or unspecified chronic kidney disease: Secondary | ICD-10-CM | POA: Diagnosis present

## 2018-11-21 DIAGNOSIS — I255 Ischemic cardiomyopathy: Secondary | ICD-10-CM | POA: Diagnosis present

## 2018-11-21 DIAGNOSIS — M1990 Unspecified osteoarthritis, unspecified site: Secondary | ICD-10-CM | POA: Diagnosis present

## 2018-11-21 DIAGNOSIS — I252 Old myocardial infarction: Secondary | ICD-10-CM

## 2018-11-21 DIAGNOSIS — I739 Peripheral vascular disease, unspecified: Secondary | ICD-10-CM | POA: Diagnosis present

## 2018-11-21 DIAGNOSIS — I480 Paroxysmal atrial fibrillation: Secondary | ICD-10-CM | POA: Diagnosis present

## 2018-11-21 DIAGNOSIS — Z961 Presence of intraocular lens: Secondary | ICD-10-CM | POA: Diagnosis present

## 2018-11-21 LAB — CUP PACEART REMOTE DEVICE CHECK
Battery Remaining Longevity: 26 mo
Battery Voltage: 2.92 V
Brady Statistic AP VP Percent: 3.8 %
Brady Statistic AP VS Percent: 0.01 %
Brady Statistic AS VS Percent: 4.34 %
Brady Statistic RA Percent Paced: 3.75 %
Date Time Interrogation Session: 20191127232157
HighPow Impedance: 35 Ohm
HighPow Impedance: 50 Ohm
Implantable Lead Implant Date: 20030714
Implantable Lead Implant Date: 20140801
Implantable Lead Location: 753860
Implantable Lead Model: 5592
Implantable Pulse Generator Implant Date: 20140721
Lead Channel Impedance Value: 361 Ohm
Lead Channel Impedance Value: 456 Ohm
Lead Channel Impedance Value: 589 Ohm
Lead Channel Pacing Threshold Amplitude: 1.5 V
Lead Channel Pacing Threshold Pulse Width: 0.4 ms
Lead Channel Pacing Threshold Pulse Width: 0.4 ms
Lead Channel Sensing Intrinsic Amplitude: 1.25 mV
Lead Channel Sensing Intrinsic Amplitude: 2.875 mV
Lead Channel Sensing Intrinsic Amplitude: 2.875 mV
Lead Channel Setting Pacing Amplitude: 2 V
Lead Channel Setting Pacing Amplitude: 3 V
Lead Channel Setting Pacing Pulse Width: 0.4 ms
Lead Channel Setting Sensing Sensitivity: 0.3 mV
MDC IDC LEAD LOCATION: 753859
MDC IDC MSMT LEADCHNL RA SENSING INTR AMPL: 1.25 mV
MDC IDC MSMT LEADCHNL RV PACING THRESHOLD AMPLITUDE: 0.875 V
MDC IDC STAT BRADY AS VP PERCENT: 91.85 %
MDC IDC STAT BRADY RV PERCENT PACED: 95.57 %

## 2018-11-21 NOTE — Progress Notes (Signed)
Reason for the request:    Bladder cancer  HPI: I was asked by Dr. Gloriann Loan to evaluate [name] for diagnosis of bladder cancer.  He is 78 year old gentleman with coronary disease as well as heart failure with LVAD since 2014.  He has a history of a superficial bladder tumor and followed by Dr. Gloriann Loan.  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  He was treated with BCG on multiple occasions.  Repeat cystoscopy during 2019 showed non-muscle invasive disease till November 2019.  The final pathology from a TURBT obtained on 09/19/2018 showed high-grade invasive urothelial carcinoma.  He has been receiving palliative radiation therapy under the care of Dr. Tammi Klippel which she has tolerated with few complications.  He has reported some fatigue tiredness as well as constipation.  He denies any diarrhea or hematuria.  His ambulation has declined slightly but denies any falls or syncope.  He is using a walker at home.  He does have chronic shortness of breath which is unchanged.  He denies any flank pain or recent hospitalizations.  He does not report any headaches, blurry vision, syncope or seizures. Does not report any fevers, chills or sweats.  Does not report any cough, wheezing or hemoptysis.  Does not report any chest pain, palpitation, orthopnea or leg edema.  Does not report any nausea, vomiting or abdominal pain.  Does not report any constipation or diarrhea.  Does not report any skeletal complaints.    Does not report frequency, urgency or hematuria.  Does not report any skin rashes or lesions. Does not report any heat or cold intolerance.  Does not report any lymphadenopathy or petechiae.  Does not report any anxiety or depression.  Remaining review of systems is negative.    Past Medical History:  Diagnosis Date  . Arthritis   . Automatic implantable cardioverter-defibrillator in situ    a. s/p Medtronic Evera device serial [id number]    . Bradycardia   . CAD  (coronary artery disease)    a. s/p MI 1982;  s/p DES to CFX 2011;  s/p NSTEMI 4/12 in setting of VTach;  cath 7/12:  LM ok, LAD mid 30-40%, Dx's with 40%, mCFX stent patent, prox to mid RCA occluded with R to R and L to R collats.  Medical Tx was continued  . Chronic systolic heart failure ([place])    a. s/p LVAD 10/2013 for destination therapy  . CKD (chronic kidney disease)   . CVD (cerebrovascular disease)    a. s/p L CEA 2008  . Cytopenia   . Dysrhythmia    AFIB  . History of GI bleed    a. on ASA- started on plavix during 10/2015 admission for CVA  . HLD (hyperlipidemia)   . HTN (hypertension)   . Hypothyroidism   . Inappropriate therapy from implantable cardioverter-defibrillator    a. ATP for sinus tach SVT wavelet identified SVT but was no  passive (2014)  . Ischemic cardiomyopathy   . 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 L Ear   . Myocardial infarction ([place])    1992  . PAF (paroxysmal atrial fibrillation) ([place])   . PVD (peripheral vascular disease) ([place])    a. 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 Shortness of breath dyspnea    W/ EXERTION   . Sleep  apnea    NO CPAP NOW  HE SENT IT BACK YRS AGO  . Stroke [facility])    a. 10/2015 admission   . Ventricular tachycardia ([place])    a. s/p AICD;   h/o ICD shock;  Amiodarone Rx. S/P ablation in 2012  :  Past Surgical History:  Procedure Laterality Date  . BACK SURGERY    . CARDIAC CATHETERIZATION     "several" (05/25/2013)  . CARDIAC DEFIBRILLATOR PLACEMENT  2003; 2005; 05/25/2013   2014: Medtronic Evera device serial number [id number]  . CARDIAC ELECTROPHYSIOLOGY STUDY AND ABLATION  2012  . CARDIOVERSION N/A 10/15/2013   Procedure: CARDIOVERSION;  Surgeon: Jolaine Artist, MD;  Location: [place];  Service: Cardiovascular;  Laterality: N/A;  . CARDIOVERSION N/A 11/04/2013   Procedure: CARDIOVERSION;  Surgeon: Larey Dresser, MD;  Location: [place];  Service: Cardiovascular;  Laterality: N/A;  . CARDIOVERSION N/A 06/04/2014   Procedure: CARDIOVERSION;  Surgeon: Jolaine Artist, MD;  Location: [facility] ENDOSCOPY;  Service: Cardiovascular;  Laterality: N/A;  . CARDIOVERSION N/A 03/02/2015   Procedure: CARDIOVERSION;  Surgeon: Jolaine Artist, MD;  Location: [facility] ENDOSCOPY;  Service: Cardiovascular;  Laterality: N/A;  . CARDIOVERSION N/A 01/19/2016   Procedure: CARDIOVERSION;  Surgeon: Larey Dresser, MD;  Location: [place];  Service: Cardiovascular;  Laterality: N/A;  . CARDIOVERSION N/A 06/10/2018   Procedure: CARDIOVERSION;  Surgeon: Jolaine Artist, MD;  Location: [facility] ENDOSCOPY;  Service: Cardiovascular;  Laterality: N/A;  . CARDIOVERSION N/A 07/02/2018   Procedure: CARDIOVERSION;  Surgeon: Jolaine Artist, MD;  Location: [facility] ENDOSCOPY;  Service: Cardiovascular;  Laterality: N/A;  . CARDIOVERSION N/A 11/07/2018   Procedure: CARDIOVERSION;  Surgeon: Larey Dresser, MD;  Location: [place];  Service: Cardiovascular;  Laterality: N/A;  . CAROTID ENDARTERECTOMY Left ~ 2007  . CATARACT EXTRACTION W/ INTRAOCULAR LENS  IMPLANT, BILATERAL Bilateral 2000  . CHOLECYSTECTOMY  12/15/?2010  . COLONOSCOPY WITH PROPOFOL N/A Oct 11, 202018   Procedure: COLONOSCOPY WITH PROPOFOL;  Surgeon: Ronald Lobo, MD;  Location: [place];  Service: Endoscopy;  Laterality: N/A;  . CORONARY ANGIOPLASTY  1992  . CORONARY ANGIOPLASTY WITH STENT PLACEMENT     "last one was 11/2012  (05/25/2013)  . CYSTOGRAM N/A 09/19/2018   Procedure: CYSTOGRAM;  Surgeon: Lucas Mallow, MD;  Location: [place];  Service: Urology;  Laterality: N/A;  . CYSTOSCOPY W/ RETROGRADES Bilateral 05/16/2018   Procedure: CYSTOSCOPY WITH RETROGRADE PYELOGRAM;  Surgeon: Lucas Mallow, MD;  Location: [place];  Service: Urology;  Laterality: Bilateral;  . CYSTOSCOPY WITH RETROGRADE PYELOGRAM, URETEROSCOPY AND STENT PLACEMENT Left 08/09/2014   Procedure: CYSTOSCOPY WITH RETROGRADE  PYELOGRAM, URETEROSCOPY AND STENT PLACEMENT;  Surgeon: Bernestine Amass, MD;  Location: [facility];  Service: Urology;  Laterality: Left;  . CYSTOSCOPY WITH RETROGRADE PYELOGRAM, URETEROSCOPY AND STENT PLACEMENT Left 09/01/2014   Procedure: CYSTOSCOPY WITH URETEROSCOPY AND STENT REMOVAL;  Surgeon: Bernestine Amass, MD;  Location: [facility];  Service: Urology;  Laterality: Left;  . CYSTOSCOPY WITH RETROGRADE PYELOGRAM, URETEROSCOPY AND STENT PLACEMENT Left 09/20/2014   Procedure: CYSTOSCOPY WITH RETROGRADE PYELOGRAM, URETEROSCOPY AND STENT PLACEMENT, stone removal;  Surgeon: Bernestine Amass, MD;  Location: [facility];  Service: Urology;  Laterality: Left;  . CYSTOSCOPY WITH RETROGRADE PYELOGRAM, URETEROSCOPY AND STENT PLACEMENT Bilateral 12/04/2017   Procedure: CYSTOSCOPY WITH BILATERAL RETROGRADE PYELOGRAM, LEFT URETEROSCOPY AND STENT PLACEMENT;  Surgeon: Lucas Mallow, MD;  Location: [place];  Service: Urology;  Laterality: Bilateral;  . CYSTOSCOPY WITH RETROGRADE PYELOGRAM,  URETEROSCOPY AND STENT PLACEMENT Left 09/19/2018   Procedure: left  RETROGRADE PYELOGRAM;  Surgeon: Lucas Mallow, MD;  Location: [place];  Service: Urology;  Laterality: Left;  . DOPPLER ECHOCARDIOGRAPHY  2011  . ELBOW ARTHROSCOPY Right 1990's  . ELECTROPHYSIOLOGIC STUDY N/A 07/19/2016   Procedure: Cardioversion;  Surgeon: Evans Lance, MD;  Location: [place] CV LAB;  Service: Cardiovascular;  Laterality: N/A;  . ENDARTERECTOMY Right 01/13/2016   Procedure: RIGHT CAROTID ARTERY ENDARTERECTOMY;  Surgeon: Mal Misty, MD;  Location: [place];  Service: Vascular;  Laterality: Right;  . ENTEROSCOPY Left 02/23/2017   Procedure: ENTEROSCOPY;  Surgeon: Ronald Lobo, MD;  Location: [facility] ENDOSCOPY;  Service: Endoscopy;  Laterality: Left;  . ENTEROSCOPY N/A 10/17/2017   Procedure: ENTEROSCOPY;  Surgeon: Yetta Flock, MD;  Location: [facility] ENDOSCOPY;  Service: Gastroenterology;  Laterality: N/A;  LVAD pt.    . ENTEROSCOPY N/A 10/24/2018    Procedure: ENTEROSCOPY;  Surgeon: Rush Landmark Telford Nab., MD;  Location: [place];  Service: Gastroenterology;  Laterality: N/A;  . ESOPHAGOGASTRODUODENOSCOPY N/A 02/15/2015   Procedure: ESOPHAGOGASTRODUODENOSCOPY (EGD);  Surgeon: Carol Ada, MD;  Location: [facility] ENDOSCOPY;  Service: Endoscopy;  Laterality: N/A;  LVAD  . EYE SURGERY     VITRECTOMY  LEFT 05/2012  . FEMORAL-POPLITEAL BYPASS GRAFT Right 2011  . FEMORAL-POPLITEAL BYPASS GRAFT Left 05/2002   Archie Endo 05/06/2002 (05/25/2013)  . GIVENS CAPSULE STUDY N/A 10/25/2018   Procedure: GIVENS CAPSULE STUDY;  Surgeon: Thornton Park, MD;  Location: [place];  Service: Gastroenterology;  Laterality: N/A;  . HEEL SPUR SURGERY Right 1990's  . HOLMIUM LASER APPLICATION Left 25/42/7062   Procedure: HOLMIUM LASER APPLICATION;  Surgeon: Bernestine Amass, MD;  Location: [facility];  Service: Urology;  Laterality: Left;  . HOLMIUM LASER APPLICATION Left 3/76/2831   Procedure: HOLMIUM LASER APPLICATION;  Surgeon: Lucas Mallow, MD;  Location: [place];  Service: Urology;  Laterality: Left;  . HOT HEMOSTASIS N/A 10/24/2018   Procedure: HOT HEMOSTASIS (ARGON PLASMA COAGULATION/BICAP);  Surgeon: Irving Copas., MD;  Location: [place];  Service: Gastroenterology;  Laterality: N/A;  . IMPLANTABLE CARDIOVERTER DEFIBRILLATOR GENERATOR CHANGE N/A 05/25/2013   Procedure: IMPLANTABLE CARDIOVERTER DEFIBRILLATOR GENERATOR CHANGE;  Surgeon: Deboraha Sprang, MD;  Location: [facility] CATH LAB;  Service: Cardiovascular;  Laterality: N/A;  . INSERTION OF IMPLANTABLE LEFT VENTRICULAR ASSIST DEVICE N/A 10/19/2013   Procedure: INSERTION OF IMPLANTABLE LEFT VENTRICULAR ASSIST DEVICE;  Surgeon: Gaye Pollack, MD;  Location: [place];  Service: Open Heart Surgery;  Laterality: N/A;  CIRC ARREST  NITRIC OXIDE  MEDTRONIC ICD  . INTRAOPERATIVE TRANSESOPHAGEAL ECHOCARDIOGRAM N/A 10/19/2013   Procedure: INTRAOPERATIVE TRANSESOPHAGEAL ECHOCARDIOGRAM;  Surgeon: Gaye Pollack,  MD;  Location: [facility] OR;  Service: Open Heart Surgery;  Laterality: N/A;  . IR FLUORO GUIDE CV LINE RIGHT  12/10/2017  . IR US GUIDE VASC ACCESS RIGHT  12/10/2017  . KNEE ARTHROSCOPY Bilateral 1990's  . LEAD REVISION N/A 06/05/2013   Procedure: LEAD REVISION;  Surgeon: Thompson Grayer, MD;  Location: [facility] CATH LAB;  Service: Cardiovascular;  Laterality: N/A;  . LEFT HEART CATHETERIZATION WITH CORONARY ANGIOGRAM N/A 11/24/2012   Procedure: LEFT HEART CATHETERIZATION WITH CORONARY ANGIOGRAM;  Surgeon: Peter M Martinique, MD;  Location: [facility] CATH LAB;  Service: Cardiovascular;  Laterality: N/A;  . LIPOMA EXCISION Left 2013   "near ear" (05/25/2013)  . [place]; 2000's  . PATCH ANGIOPLASTY Right 01/13/2016   Procedure: WITH  PATCH ANGIOPLASTY;  Surgeon: Mal Misty,  MD;  Location: [place];  Service: Vascular;  Laterality: Right;  . RENAL ARTERY BYPASS Bilateral 2003  . REVASCULARIZATION / IN-SITU GRAFT LEG    . RIGHT HEART CATH N/A 11/29/2017   Procedure: RIGHT HEART CATH;  Surgeon: Jolaine Artist, MD;  Location: [place] CV LAB;  Service: Cardiovascular;  Laterality: N/A;  . RIGHT HEART CATHETERIZATION N/A 09/17/2013   Procedure: RIGHT HEART CATH;  Surgeon: Jolaine Artist, MD;  Location: [facility] CATH LAB;  Service: Cardiovascular;  Laterality: N/A;  . RIGHT HEART CATHETERIZATION N/A 10/14/2013   Procedure: RIGHT HEART CATH;  Surgeon: Jolaine Artist, MD;  Location: [facility] CATH LAB;  Service: Cardiovascular;  Laterality: N/A;  . RIGHT HEART CATHETERIZATION N/A 11/16/2013   Procedure: RIGHT HEART CATH;  Surgeon: Jolaine Artist, MD;  Location: [facility] CATH LAB;  Service: Cardiovascular;  Laterality: N/A;  . RIGHT HEART CATHETERIZATION N/A 07/13/2014   Procedure: RIGHT HEART CATH;  Surgeon: Jolaine Artist, MD;  Location: [facility] CATH LAB;  Service: Cardiovascular;  Laterality: N/A;  . TEE WITHOUT CARDIOVERSION N/A 11/04/2013   Procedure: TRANSESOPHAGEAL ECHOCARDIOGRAM (TEE);  Surgeon: Larey Dresser,  MD;  Location: [place];  Service: Cardiovascular;  Laterality: N/A;  . TEE WITHOUT CARDIOVERSION N/A 03/02/2015   Procedure: TRANSESOPHAGEAL ECHOCARDIOGRAM (TEE);  Surgeon: Jolaine Artist, MD;  Location: [facility] ENDOSCOPY;  Service: Cardiovascular;  Laterality: N/A;  . TEE WITHOUT CARDIOVERSION N/A 01/19/2016   Procedure: TRANSESOPHAGEAL ECHOCARDIOGRAM (TEE);  Surgeon: Larey Dresser, MD;  Location: [place];  Service: Cardiovascular;  Laterality: N/A;  . TONSILLECTOMY  1946  . TRANSURETHRAL RESECTION OF BLADDER TUMOR N/A 12/04/2017   Procedure: TRANSURETHRAL RESECTION OF BLADDER TUMOR (TURBT);  Surgeon: Lucas Mallow, MD;  Location: [place];  Service: Urology;  Laterality: N/A;  . TRANSURETHRAL RESECTION OF BLADDER TUMOR N/A 05/16/2018   Procedure: TRANSURETHRAL RESECTION OF BLADDER TUMOR (TURBT);  Surgeon: Lucas Mallow, MD;  Location: [place];  Service: Urology;  Laterality: N/A;  . TRANSURETHRAL RESECTION OF BLADDER TUMOR WITH MITOMYCIN-C N/A 09/19/2018   Procedure: TRANSURETHRAL RESECTION OF BLADDER TUMOR;  Surgeon: Lucas Mallow, MD;  Location: [place];  Service: Urology;  Laterality: N/A;  . TUMOR EXCISION Left 1960's   "fatty tumor" (05/25/2013)  . V-TACH ABLATION N/A 09/12/2011   Procedure: V-TACH ABLATION;  Surgeon: Evans Lance, MD;  Location: [facility] CATH LAB;  Service: Cardiovascular;  Laterality: N/A;  . V-TACH ABLATION N/A 06/08/2013   Procedure: V-TACH ABLATION;  Surgeon: Evans Lance, MD;  Location: [facility] CATH LAB;  Service: Cardiovascular;  Laterality: N/A;  :   Current Outpatient Medications:  .  amiodarone (PACERONE) 200 MG tablet, Take 1 tablet (200 mg total) by mouth 2 (two) times daily., Disp: 120 tablet, Rfl: 5 .  atorvastatin (LIPITOR) 80 MG tablet, Take 1 tablet (80 mg total) by mouth at bedtime., Disp: 90 tablet, Rfl: 1 .  docusate sodium (COLACE) 100 MG capsule, Take 100 mg by mouth daily. , Disp: , Rfl:  .  dronabinol (MARINOL) 2.5 MG capsule, Take 1  capsule (2.5 mg total) by mouth 2 (two) times daily before lunch and supper., Disp: 60 capsule, Rfl: 3 .  fludrocortisone (FLORINEF) 0.1 MG tablet, Take 1 tablet (0.1 mg total) by mouth 2 (two) times daily., Disp: 60 tablet, Rfl: 6 .  furosemide (LASIX) 20 MG tablet, Take 20 mg by mouth daily as needed for fluid. , Disp: , Rfl:  .  levothyroxine (SYNTHROID, LEVOTHROID) 150 MCG tablet,  Take 1 tablet (150 mcg total) by mouth daily before breakfast., Disp: 90 tablet, Rfl: 3 .  midodrine (PROAMATINE) 10 MG tablet, Take 1 tablet (10 mg total) by mouth 3 (three) times daily with meals., Disp: 90 tablet, Rfl: 3 .  pantoprazole (PROTONIX) 40 MG tablet, TAKE 1 TABLET TWICE DAILY (Patient taking differently: Take 40 mg by mouth 2 (two) times daily before a meal. ), Disp: 180 tablet, Rfl: 6 .  potassium chloride SA (K-DUR,KLOR-CON) 20 MEQ tablet, Take 1 tablet (20 mEq total) by mouth daily., Disp: 90 tablet, Rfl: 3 .  promethazine (PHENERGAN) 12.5 MG tablet, Take 12.5 mg by mouth every 6 (six) hours as needed for nausea or vomiting., Disp: , Rfl:  .  protein supplement shake (PREMIER PROTEIN) LIQD, Take 11 oz by mouth once. , Disp: , Rfl:  .  sotalol (BETAPACE) 80 MG tablet, Take 1 tablet (80 mg total) by mouth 2 (two) times daily., Disp: 60 tablet, Rfl: 6 .  spironolactone (ALDACTONE) 25 MG tablet, Take 0.5 tablets (12.5 mg total) by mouth daily., Disp: 90 tablet, Rfl: 3 .  temazepam (RESTORIL) 30 MG capsule, Take 1 capsule (30 mg total) by mouth at bedtime as needed for sleep., Disp: 30 capsule, Rfl: 3 .  warfarin (COUMADIN) 2 MG tablet, Take 2 mg daily, further as directed by VAD clinic. (Patient taking differently: Take 1 mg by mouth See admin instructions. 1mg  daily), Disp: , Rfl:  .  Multiple Vitamin (MULTIVITAMIN WITH MINERALS) TABS tablet, Take 1 tablet by mouth daily., Disp: , Rfl: :  Allergies  Allergen Reactions  . Demerol [Meperidine] Other (See Comments)    Paralysis/ Could only move eyes  .  Coconut Oil Hives  :  Family History  Problem Relation Age of Onset  . Heart attack Mother   . Coronary artery disease Mother   . Cancer Mother   . Heart disease Mother   . Hyperlipidemia Mother   . Hypertension Mother   . Coronary artery disease Father   . Stroke Father   . Cancer Father   . Heart disease Father        before age 24  . Hyperlipidemia Father   . Hypertension Father   . Heart attack Father   . Coronary artery disease Other   :  Social History   Socioeconomic History  . Marital status: Divorced    Spouse name: Not on file  . Number of children: 2  . Years of education: Not on file  . Highest education level: Not on file  Occupational History  . Occupation: retired    Comment: Health visitor  . Occupation: Retired    Comment: Security [employer]  . Occupation: Part-time    Comment: Security at [employer]  . Financial resource strain: Not on file  . Food insecurity:    Worry: Not on file    Inability: Not on file  . Transportation needs:    Medical: No    Non-medical: No  Tobacco Use  . Smoking status: Former Smoker    Packs/day: 0.50    Years: 37.00    Pack years: 18.50    Types: Cigarettes    Last attempt to quit: 11/05/2001    Years since quitting: 17.0  . Smokeless tobacco: Never Used  Substance and Sexual Activity  . Alcohol use: Yes    Alcohol/week: 0.0 standard drinks    Comment: 05/25/2013 "mixed drink couple times/month"  . Drug use: No  .  Sexual activity: Never  Lifestyle  . Physical activity:    Days per week: Not on file    Minutes per session: Not on file  . Stress: Not on file  Relationships  . Social connections:    Talks on phone: Not on file    Gets together: Not on file    Attends religious service: Not on file    Active member of club or organization: Not on file    Attends meetings of clubs or organizations: Not on file    Relationship status: Not on file  . Intimate partner violence:    Fear of  current or ex partner: Not on file    Emotionally abused: Not on file    Physically abused: Not on file    Forced sexual activity: Not on file  Other Topics Concern  . Not on file  Social History Narrative   Lives with sig other.  :  Pertinent items are noted in HPI.  Exam: Blood pressure (!) 105/92, pulse 90, temperature (!) 97.5 F (36.4 C), temperature source Oral, resp. rate 18, height 6' (1.829 m), weight 196 lb 9.6 oz (89.2 kg), SpO2 93 %.  ECOG 2 General appearance: alert and cooperative appeared without distress. Head: atraumatic without any abnormalities. Eyes: conjunctivae/corneas clear. PERRL.  Sclera anicteric. Throat: lips, mucosa, and tongue normal; without oral thrush or ulcers. Resp: clear to auscultation bilaterally without rhonchi, wheezes or dullness to percussion. Cardio: regular rate and rhythm, S1, S2.  GI: soft, non-tender; bowel sounds normal; no masses,  no organomegaly Skin: Skin color, texture, turgor normal. No rashes or lesions Lymph nodes: Cervical, supraclavicular, and axillary nodes normal. Neurologic: Grossly normal without any motor, sensory or deep tendon reflexes. Musculoskeletal: No joint deformity or effusion.  CBC    Component Value Date/Time   WBC 5.3 11/18/2018 0839   RBC 3.07 (L) 11/18/2018 0839   HGB 10.3 (L) 11/18/2018 0839   HGB 11.9 (L) 10/01/2017 1025   HCT 32.1 (L) 11/18/2018 0839   HCT 37.0 (L) 10/01/2017 1025   PLT 202 11/18/2018 0839   PLT 187 10/01/2017 1025   MCV 104.6 (H) 11/18/2018 0839   MCV 95 10/01/2017 1025   MCH 33.6 11/18/2018 0839   MCHC 32.1 11/18/2018 0839   RDW 20.6 (H) 11/18/2018 0839   RDW 16.6 (H) 10/01/2017 1025   LYMPHSABS 0.9 10/22/2018 1142   LYMPHSABS 0.9 10/01/2017 1025   MONOABS 0.9 10/22/2018 1142   EOSABS 0.2 10/22/2018 1142   EOSABS 0.3 10/01/2017 1025   BASOSABS 0.0 10/22/2018 1142   BASOSABS 0.0 10/01/2017 1025     Chemistry      Component Value Date/Time   NA 137 11/18/2018 0839    NA 139 08/16/2014 1420   K 3.0 (L) 11/18/2018 0839   CL 103 11/18/2018 0839   CO2 25 11/18/2018 0839   BUN 19 11/18/2018 0839   BUN 22 08/16/2014 1420   CREATININE 1.45 (H) 11/18/2018 0839      Component Value Date/Time   CALCIUM 8.7 (L) 11/18/2018 0839   ALKPHOS 143 (H) 10/22/2018 1142   AST 35 10/22/2018 1142   ALT 30 10/22/2018 1142   BILITOT 0.7 10/22/2018 1142       Assessment and Plan:   78 year old man with the following:  1.  High-grade urothelial carcinoma of the bladder that is currently muscle invasive diagnosed in November 2019.  He is status post BCG in the past for superficial bladder tumors.  The natural course of  this disease was discussed today with the patient.  His case was also discussed in the GU tumor board.  He is currently receiving radiation therapy alone without chemotherapy given his comorbid conditions and overall debilitated status.  I recommended reserving chemotherapy in case needed in the future if he develops metastatic disease.  He understands that this therapy might not be curative and palliative approach for now and possibly in the future may be needed if his disease relapses.  I have recommended finishing radiation therapy as scheduled.  He will have repeat cystoscopies under the care of Dr. Gloriann Loan in the interim.  2.  Follow-up: I am happy to see him in the future as needed if he develops advanced disease.  40  minutes was spent with the patient face-to-face today.  More than 50% of time was dedicated to reviewing his disease status, treatment options and answering questions regarding future plan of care.   Thank you for the referral. A copy of this consult has been forwarded to the requesting physician.

## 2018-11-24 ENCOUNTER — Other Ambulatory Visit (HOSPITAL_COMMUNITY): Payer: Medicare Other

## 2018-11-24 ENCOUNTER — Encounter (HOSPITAL_COMMUNITY): Payer: Self-pay | Admitting: *Deleted

## 2018-11-24 ENCOUNTER — Encounter (HOSPITAL_COMMUNITY): Payer: Medicare Other

## 2018-11-24 ENCOUNTER — Inpatient Hospital Stay: Payer: Self-pay

## 2018-11-24 ENCOUNTER — Other Ambulatory Visit: Payer: Self-pay

## 2018-11-24 ENCOUNTER — Emergency Department (HOSPITAL_COMMUNITY): Payer: Medicare Other

## 2018-11-24 ENCOUNTER — Ambulatory Visit: Payer: Medicare Other

## 2018-11-24 ENCOUNTER — Inpatient Hospital Stay (HOSPITAL_COMMUNITY): Payer: Medicare Other

## 2018-11-24 ENCOUNTER — Inpatient Hospital Stay (HOSPITAL_COMMUNITY)
Admission: EM | Admit: 2018-11-24 | Discharge: 2018-11-27 | DRG: 640 | Disposition: A | Discharge status: Home / Self Care | Payer: Medicare Other | Attending: Internal Medicine | Admitting: Internal Medicine

## 2018-11-24 DIAGNOSIS — Z95811 Presence of heart assist device: Secondary | ICD-10-CM

## 2018-11-24 DIAGNOSIS — E039 Hypothyroidism, unspecified: Secondary | ICD-10-CM | POA: Diagnosis present

## 2018-11-24 DIAGNOSIS — I50810 Right heart failure, unspecified: Secondary | ICD-10-CM | POA: Diagnosis present

## 2018-11-24 DIAGNOSIS — I252 Old myocardial infarction: Secondary | ICD-10-CM | POA: Diagnosis not present

## 2018-11-24 DIAGNOSIS — Z961 Presence of intraocular lens: Secondary | ICD-10-CM | POA: Diagnosis present

## 2018-11-24 DIAGNOSIS — Z8551 Personal history of malignant neoplasm of bladder: Secondary | ICD-10-CM | POA: Diagnosis not present

## 2018-11-24 DIAGNOSIS — R627 Adult failure to thrive: Secondary | ICD-10-CM | POA: Diagnosis present

## 2018-11-24 DIAGNOSIS — Z9841 Cataract extraction status, right eye: Secondary | ICD-10-CM | POA: Diagnosis not present

## 2018-11-24 DIAGNOSIS — Z8349 Family history of other endocrine, nutritional and metabolic diseases: Secondary | ICD-10-CM | POA: Diagnosis not present

## 2018-11-24 DIAGNOSIS — Z823 Family history of stroke: Secondary | ICD-10-CM | POA: Diagnosis not present

## 2018-11-24 DIAGNOSIS — Z8673 Personal history of transient ischemic attack (TIA), and cerebral infarction without residual deficits: Secondary | ICD-10-CM | POA: Diagnosis not present

## 2018-11-24 DIAGNOSIS — I130 Hypertensive heart and chronic kidney disease with heart failure and stage 1 through stage 4 chronic kidney disease, or unspecified chronic kidney disease: Secondary | ICD-10-CM | POA: Diagnosis present

## 2018-11-24 DIAGNOSIS — E785 Hyperlipidemia, unspecified: Secondary | ICD-10-CM | POA: Diagnosis present

## 2018-11-24 DIAGNOSIS — N170 Acute kidney failure with tubular necrosis: Secondary | ICD-10-CM | POA: Diagnosis present

## 2018-11-24 DIAGNOSIS — I480 Paroxysmal atrial fibrillation: Secondary | ICD-10-CM | POA: Diagnosis present

## 2018-11-24 DIAGNOSIS — R531 Weakness: Secondary | ICD-10-CM

## 2018-11-24 DIAGNOSIS — Z809 Family history of malignant neoplasm, unspecified: Secondary | ICD-10-CM | POA: Diagnosis not present

## 2018-11-24 DIAGNOSIS — I5023 Acute on chronic systolic (congestive) heart failure: Secondary | ICD-10-CM | POA: Diagnosis present

## 2018-11-24 DIAGNOSIS — I739 Peripheral vascular disease, unspecified: Secondary | ICD-10-CM | POA: Diagnosis present

## 2018-11-24 DIAGNOSIS — Z85820 Personal history of malignant melanoma of skin: Secondary | ICD-10-CM | POA: Diagnosis not present

## 2018-11-24 DIAGNOSIS — Z9842 Cataract extraction status, left eye: Secondary | ICD-10-CM | POA: Diagnosis not present

## 2018-11-24 DIAGNOSIS — N183 Chronic kidney disease, stage 3 unspecified: Secondary | ICD-10-CM | POA: Diagnosis present

## 2018-11-24 DIAGNOSIS — Z95810 Presence of automatic (implantable) cardiac defibrillator: Secondary | ICD-10-CM | POA: Diagnosis not present

## 2018-11-24 DIAGNOSIS — M1990 Unspecified osteoarthritis, unspecified site: Secondary | ICD-10-CM | POA: Diagnosis present

## 2018-11-24 DIAGNOSIS — I2510 Atherosclerotic heart disease of native coronary artery without angina pectoris: Secondary | ICD-10-CM | POA: Diagnosis present

## 2018-11-24 DIAGNOSIS — Z8249 Family history of ischemic heart disease and other diseases of the circulatory system: Secondary | ICD-10-CM | POA: Diagnosis not present

## 2018-11-24 DIAGNOSIS — I255 Ischemic cardiomyopathy: Secondary | ICD-10-CM | POA: Diagnosis present

## 2018-11-24 LAB — COMPREHENSIVE METABOLIC PANEL
ALBUMIN: 3.6 g/dL (ref 3.5–5.0)
ALT: 91 U/L — ABNORMAL HIGH (ref 0–44)
ANION GAP: 11 (ref 5–15)
AST: 118 U/L — ABNORMAL HIGH (ref 15–41)
Alkaline Phosphatase: 143 U/L — ABNORMAL HIGH (ref 38–126)
BUN: 23 mg/dL (ref 8–23)
CO2: 22 mmol/L (ref 22–32)
Calcium: 8.8 mg/dL — ABNORMAL LOW (ref 8.9–10.3)
Chloride: 108 mmol/L (ref 98–111)
Creatinine, Ser: 1.81 mg/dL — ABNORMAL HIGH (ref 0.61–1.24)
GFR calc Af Amer: 40 mL/min — ABNORMAL LOW (ref >60)
GFR calc non Af Amer: 35 mL/min — ABNORMAL LOW (ref >60)
GLUCOSE: 108 mg/dL — AB (ref 70–99)
Potassium: 4.2 mmol/L (ref 3.5–5.1)
SODIUM: 141 mmol/L (ref 135–145)
Total Bilirubin: 1 mg/dL (ref 0.3–1.2)
Total Protein: 6.5 g/dL (ref 6.5–8.1)

## 2018-11-24 LAB — CBC WITH DIFFERENTIAL/PLATELET
ABS IMMATURE GRANULOCYTES: 0.04 10*3/uL (ref 0.00–0.07)
BASOS PCT: 1 %
Basophils Absolute: 0 10*3/uL (ref 0.0–0.1)
Eosinophils Absolute: 0.1 10*3/uL (ref 0.0–0.5)
Eosinophils Relative: 1 %
HCT: 33.8 % — ABNORMAL LOW (ref 39.0–52.0)
Hemoglobin: 10.2 g/dL — ABNORMAL LOW (ref 13.0–17.0)
Immature Granulocytes: 1 %
Lymphocytes Relative: 10 %
Lymphs Abs: 0.6 10*3/uL — ABNORMAL LOW (ref 0.7–4.0)
MCH: 32.7 pg (ref 26.0–34.0)
MCHC: 30.2 g/dL (ref 30.0–36.0)
MCV: 108.3 fL — ABNORMAL HIGH (ref 80.0–100.0)
Monocytes Absolute: 1.2 10*3/uL — ABNORMAL HIGH (ref 0.1–1.0)
Monocytes Relative: 19 %
NEUTROS ABS: 4.4 10*3/uL (ref 1.7–7.7)
Neutrophils Relative %: 68 %
Platelets: 173 10*3/uL (ref 150–400)
RBC: 3.12 MIL/uL — AB (ref 4.22–5.81)
RDW: 21.8 % — ABNORMAL HIGH (ref 11.5–15.5)
WBC: 6.5 10*3/uL (ref 4.0–10.5)
nRBC: 0.6 % — ABNORMAL HIGH (ref 0.0–0.2)

## 2018-11-24 LAB — COOXEMETRY PANEL
CARBOXYHEMOGLOBIN: 2.1 % — AB (ref 0.5–1.5)
Carboxyhemoglobin: 2.3 % — ABNORMAL HIGH (ref 0.5–1.5)
Methemoglobin: 0.9 % (ref 0.0–1.5)
Methemoglobin: 1.1 % (ref 0.0–1.5)
O2 Saturation: 46.1 %
O2 Saturation: 62.3 %
TOTAL HEMOGLOBIN: 9.2 g/dL — AB (ref 12.0–16.0)
Total hemoglobin: 9.2 g/dL — ABNORMAL LOW (ref 12.0–16.0)

## 2018-11-24 LAB — BASIC METABOLIC PANEL
Anion gap: 10 (ref 5–15)
BUN: 23 mg/dL (ref 8–23)
CO2: 22 mmol/L (ref 22–32)
CREATININE: 1.76 mg/dL — AB (ref 0.61–1.24)
Calcium: 8.6 mg/dL — ABNORMAL LOW (ref 8.9–10.3)
Chloride: 108 mmol/L (ref 98–111)
GFR calc Af Amer: 42 mL/min — ABNORMAL LOW (ref >60)
GFR calc non Af Amer: 36 mL/min — ABNORMAL LOW (ref >60)
Glucose, Bld: 103 mg/dL — ABNORMAL HIGH (ref 70–99)
Potassium: 4.1 mmol/L (ref 3.5–5.1)
Sodium: 140 mmol/L (ref 135–145)

## 2018-11-24 LAB — MRSA PCR SCREENING: MRSA by PCR: NEGATIVE

## 2018-11-24 LAB — URINALYSIS, ROUTINE W REFLEX MICROSCOPIC
Bilirubin Urine: NEGATIVE
Glucose, UA: NEGATIVE mg/dL
Ketones, ur: NEGATIVE mg/dL
NITRITE: NEGATIVE
Protein, ur: NEGATIVE mg/dL
RBC / HPF: >50 RBC/hpf — ABNORMAL HIGH (ref 0–5)
Specific Gravity, Urine: 1.008 (ref 1.005–1.030)
pH: 5 (ref 5.0–8.0)

## 2018-11-24 LAB — ECHOCARDIOGRAM COMPLETE
Height: 72 in
Weight: 2987.67 oz

## 2018-11-24 LAB — PROTIME-INR
INR: 3.15
Prothrombin Time: 31.9 seconds — ABNORMAL HIGH (ref 11.4–15.2)

## 2018-11-24 LAB — CBC
HCT: 33.4 % — ABNORMAL LOW (ref 39.0–52.0)
Hemoglobin: 10.3 g/dL — ABNORMAL LOW (ref 13.0–17.0)
MCH: 32.7 pg (ref 26.0–34.0)
MCHC: 30.8 g/dL (ref 30.0–36.0)
MCV: 106 fL — AB (ref 80.0–100.0)
Platelets: 160 10*3/uL (ref 150–400)
RBC: 3.15 MIL/uL — ABNORMAL LOW (ref 4.22–5.81)
RDW: 21.9 % — ABNORMAL HIGH (ref 11.5–15.5)
WBC: 6 10*3/uL (ref 4.0–10.5)
nRBC: 0 % (ref 0.0–0.2)

## 2018-11-24 LAB — TSH: TSH: 7.089 u[IU]/mL — ABNORMAL HIGH (ref 0.350–4.500)

## 2018-11-24 LAB — LACTATE DEHYDROGENASE: LDH: 376 U/L — ABNORMAL HIGH (ref 98–192)

## 2018-11-24 MED ORDER — DOCUSATE SODIUM 100 MG PO CAPS
100.0000 mg | ORAL_CAPSULE | Freq: Every day | ORAL | Status: DC
  Administered 2018-11-24 – 2018-11-27 (×4): 100 mg via ORAL
  Filled 2018-11-24 (×4): qty 1

## 2018-11-24 MED ORDER — LEVOTHYROXINE SODIUM 75 MCG PO TABS
150.0000 ug | ORAL_TABLET | Freq: Every day | ORAL | Status: DC
  Administered 2018-11-24 – 2018-11-27 (×4): 150 ug via ORAL
  Filled 2018-11-24 (×4): qty 2

## 2018-11-24 MED ORDER — TEMAZEPAM 15 MG PO CAPS
30.0000 mg | ORAL_CAPSULE | Freq: Every evening | ORAL | Status: DC | PRN
  Administered 2018-11-24 – 2018-11-26 (×3): 30 mg via ORAL
  Filled 2018-11-24 (×3): qty 2

## 2018-11-24 MED ORDER — SOTALOL HCL 80 MG PO TABS
80.0000 mg | ORAL_TABLET | Freq: Two times a day (BID) | ORAL | Status: DC
  Administered 2018-11-24 – 2018-11-27 (×7): 80 mg via ORAL
  Filled 2018-11-24 (×8): qty 1

## 2018-11-24 MED ORDER — SODIUM CHLORIDE 0.9% FLUSH
10.0000 mL | Freq: Two times a day (BID) | INTRAVENOUS | Status: DC
  Administered 2018-11-24 – 2018-11-26 (×5): 10 mL

## 2018-11-24 MED ORDER — SODIUM CHLORIDE 0.9% FLUSH: 10.0000 mL | INTRAVENOUS | Status: DC | PRN

## 2018-11-24 MED ORDER — PROMETHAZINE HCL 25 MG PO TABS: 12.5000 mg | ORAL_TABLET | Freq: Four times a day (QID) | ORAL | Status: DC | PRN

## 2018-11-24 MED ORDER — ONDANSETRON HCL 4 MG/2ML IJ SOLN: 4.0000 mg | Freq: Four times a day (QID) | INTRAMUSCULAR | Status: DC | PRN

## 2018-11-24 MED ORDER — ENSURE MAX PROTEIN PO LIQD
11.0000 [oz_av] | Freq: Every day | ORAL | Status: DC
  Administered 2018-11-24 – 2018-11-26 (×3): 11 [oz_av] via ORAL
  Filled 2018-11-24 (×4): qty 330

## 2018-11-24 MED ORDER — DRONABINOL 2.5 MG PO CAPS
2.5000 mg | ORAL_CAPSULE | Freq: Two times a day (BID) | ORAL | Status: DC
  Administered 2018-11-24 (×2): 2.5 mg via ORAL
  Filled 2018-11-24 (×3): qty 1

## 2018-11-24 MED ORDER — ACETAMINOPHEN 325 MG PO TABS: 650.0000 mg | ORAL_TABLET | ORAL | Status: DC | PRN

## 2018-11-24 MED ORDER — ATORVASTATIN CALCIUM 80 MG PO TABS
80.0000 mg | ORAL_TABLET | Freq: Every day | ORAL | Status: DC
  Administered 2018-11-24 – 2018-11-26 (×3): 80 mg via ORAL
  Filled 2018-11-24 (×3): qty 1

## 2018-11-24 MED ORDER — AMIODARONE HCL 200 MG PO TABS
200.0000 mg | ORAL_TABLET | Freq: Two times a day (BID) | ORAL | Status: DC
  Administered 2018-11-24 – 2018-11-27 (×7): 200 mg via ORAL
  Filled 2018-11-24 (×7): qty 1

## 2018-11-24 MED ORDER — MIDODRINE HCL 5 MG PO TABS
10.0000 mg | ORAL_TABLET | Freq: Three times a day (TID) | ORAL | Status: DC
  Administered 2018-11-24 – 2018-11-27 (×12): 10 mg via ORAL
  Filled 2018-11-24 (×13): qty 2

## 2018-11-24 MED ORDER — DOBUTAMINE IN D5W 4-5 MG/ML-% IV SOLN
2.5000 ug/kg/min | INTRAVENOUS | Status: DC
  Administered 2018-11-24: 2.5 ug/kg/min via INTRAVENOUS
  Filled 2018-11-24 (×2): qty 250

## 2018-11-24 MED ORDER — PANTOPRAZOLE SODIUM 40 MG PO TBEC
40.0000 mg | DELAYED_RELEASE_TABLET | Freq: Two times a day (BID) | ORAL | Status: DC
  Administered 2018-11-24 – 2018-11-27 (×8): 40 mg via ORAL
  Filled 2018-11-24 (×8): qty 1

## 2018-11-24 MED ORDER — ADULT MULTIVITAMIN W/MINERALS CH
1.0000 | ORAL_TABLET | Freq: Every day | ORAL | Status: DC
  Administered 2018-11-24 – 2018-11-27 (×4): 1 via ORAL
  Filled 2018-11-24 (×4): qty 1

## 2018-11-24 NOTE — Plan of Care (Signed)

## 2018-11-24 NOTE — ED Notes (Signed)
IV attempted x2

## 2018-11-24 NOTE — Progress Notes (Addendum)
  Echocardiogram 2D Echocardiogram has been performed. Poor left ventricular visibility, Contrast was not needed per Dr. Haroldine Laws.  Joelene Millin 11/24/2018, 2:56 PM

## 2018-11-24 NOTE — ED Provider Notes (Signed)
Emergency Department Provider Note   I have reviewed the triage vital signs and the nursing notes.   HISTORY  Chief Complaint Fall and LVAD   HPI [name] is a 78 y.o. male with multiple medical problems and surgeries as documented below the presents the emergency department today with generalized weakness.  Patient and his wife state that this is been going on for last few days with decreased appetite increased weakness to the point where he almost falls.  He will slump to the ground secondary to generalized weakness.  He has no focal abnormalities.  He has a LVAD and recently had a reading that he was fluid overloaded so was started taking Lasix for that.  Already has known hypokalemia prior to that.  No fevers or cough.  No worsening lower extremity swelling or worsening shortness of breath from his baseline. No Recent firing of his defibrillator. No other associated or modifying symptoms.     Past Medical History:  Diagnosis Date  . Arthritis   . Automatic implantable cardioverter-defibrillator in situ    a. s/p Medtronic Evera device serial [id number]    . Bradycardia   . CAD (coronary artery disease)    a. s/p MI 1982;  s/p DES to CFX 2011;  s/p NSTEMI 4/12 in setting of VTach;  cath 7/12:  LM ok, LAD mid 30-40%, Dx's with 40%, mCFX stent patent, prox to mid RCA occluded with R to R and L to R collats.  Medical Tx was continued  . Chronic systolic heart failure ([place])    a. s/p LVAD 10/2013 for destination therapy  . CKD (chronic kidney disease)   . CVD (cerebrovascular disease)    a. s/p L CEA 2008  . Cytopenia   . Dysrhythmia    AFIB  . History of GI bleed    a. on ASA- started on plavix during 10/2015 admission for CVA  . HLD (hyperlipidemia)   . HTN (hypertension)   . Hypothyroidism   . Inappropriate therapy from implantable cardioverter-defibrillator    a. ATP for sinus tach SVT wavelet identified SVT but was no  passive (2014)  . Ischemic cardiomyopathy    . 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 L Ear   . Myocardial infarction ([place])    1992  . PAF (paroxysmal atrial fibrillation) ([place])   . PVD (peripheral vascular disease) ([place])    a. 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 Shortness of breath dyspnea    W/ EXERTION   . Sleep apnea    NO CPAP NOW  HE SENT IT BACK YRS AGO  . Stroke [facility])    a. 10/2015 admission   . Ventricular tachycardia ([place])    a. s/p AICD;   h/o ICD shock;  Amiodarone Rx. S/P ablation in 2012    Patient Active Problem List   Diagnosis Date Noted  . Failure to thrive in adult 11/24/2018  . Ventricular tachyarrhythmia ([place]) 11/07/2018  . Hematuria, gross 09/26/2018  . AKI (acute kidney injury) ([place])   . Hydronephrosis of right kidney   . 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 Protein-calorie malnutrition, severe 07/11/2018  . Ventricular tachycardia ([place]) 07/09/2018  . Malnutrition of moderate degree 05/15/2018  . Complication involving left ventricular assist device (LVAD) 05/14/2018  . Acute on chronic systolic  heart failure ([place]) 11/29/2017  . Right ureteral stone   . Melena   . Left ventricular assist device (LVAD) complication, initial encounter 03/26/2017  . Symptomatic anemia 02/20/2017  . Acute bronchitis 07/19/2016  . Open wound of lumbar region without complication 67/89/3810  . Chronic diastolic heart failure ([place])   . Spinal compression fracture ([place]) 02/03/2016  . Subtherapeutic international normalized ratio (INR) 02/01/2016  . Carotid stenosis, asymptomatic 01/31/2016  . Carotid stenosis, symptomatic w/o infarct 01/31/2016  . Carotid artery calcification 01/11/2016  . Symptomatic stenosis of right carotid artery 01/09/2016  . History of GI bleed   . PAF (paroxysmal atrial fibrillation) ([place])   . CKD (chronic kidney disease)   . Hypothyroidism   . CVA (cerebral infarction)   .  Cardiomyopathy, ischemic   . Carotid stenosis   . History of CEA (carotid endarterectomy)   . Chronic anticoagulation   . Left ventricular assist device (LVAD) complication 17/51/0258  . Jerking movements of extremities 07/14/2015  . Fall 06/14/2015  . Cough 06/01/2015  . Nausea & vomiting 02/28/2015  . Near syncope   . Chronic systolic CHF (congestive heart failure) ([place])   . Atrial tachycardia, paroxysmal ([place])   . Back pain at L4-L5 level 02/20/2015  . Compression fracture of L4 lumbar vertebra 02/20/2015  . Acute GI bleeding 02/12/2015  . Dyspnea 09/13/2014  . Nephrolithiasis 08/09/2014  . 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 CKD (chronic kidney disease) stage 3, GFR 30-59 ml/min (HCC) 03/26/2014  . RVF (right ventricular failure) ([place]) 11/01/2013  . VT (ventricular tachycardia) ([place]) 10/22/2013  . LVAD (left ventricular assist device) present ([place]) 10/20/2013  . Acute on chronic systolic (congestive) heart failure ([place]) 10/19/2013  . Atrial flutter ([place]) 10/15/2013  . Syncope 10/12/2013  . Weakness generalized 09/23/2013  . Other malaise and fatigue 09/23/2013  . Palliative care encounter 09/23/2013  . Fever 09/20/2013  . Sepsis(995.91) 09/20/2013  . Atrial tachycardia ([place]) 08/27/2013  . Chronic systolic heart failure ([place]) 08/27/2013  . Sinus bradycardia 05/11/2013  . Inappropriate therapy from implantable cardioverter-defibrillator 04/02/2013  . History of AAA (abdominal aortic aneurysm) repair 11/25/2012  . CAD (coronary artery disease) 04/30/2012  . Occlusion and stenosis of carotid artery without mention of cerebral infarction 03/25/2012  . PVD (peripheral vascular disease) ([place])   . HTN (hypertension)   . HLD (hyperlipidemia)   . Ischemic cardiomyopathy   . Automatic implantable cardioverter-defibrillator in situ 04/09/2011  . VENTRICULAR TACHYCARDIA 09/07/2009  . RENAL INSUFFICIENCY 09/07/2009    Past Surgical History:  Procedure Laterality Date  .  BACK SURGERY    . CARDIAC CATHETERIZATION     "several" (05/25/2013)  . CARDIAC DEFIBRILLATOR PLACEMENT  2003; 2005; 05/25/2013   2014: Medtronic Evera device serial number [id number]  . CARDIAC ELECTROPHYSIOLOGY STUDY AND ABLATION  2012  . CARDIOVERSION N/A 10/15/2013   Procedure: CARDIOVERSION;  Surgeon: Jolaine Artist, MD;  Location: [place];  Service: Cardiovascular;  Laterality: N/A;  . CARDIOVERSION N/A 11/04/2013   Procedure: CARDIOVERSION;  Surgeon: Larey Dresser, MD;  Location: [place];  Service: Cardiovascular;  Laterality: N/A;  . CARDIOVERSION N/A 06/04/2014   Procedure: CARDIOVERSION;  Surgeon: Jolaine Artist, MD;  Location: [facility] ENDOSCOPY;  Service: Cardiovascular;  Laterality: N/A;  . CARDIOVERSION N/A 03/02/2015   Procedure: CARDIOVERSION;  Surgeon: Jolaine Artist, MD;  [facility];  Service: Cardiovascular;  Laterality: N/A;  . CARDIOVERSION N/A 01/19/2016   Procedure: CARDIOVERSION;  Surgeon: Larey Dresser, MD;  Location: [place];  Service: Cardiovascular;  Laterality: N/A;  . CARDIOVERSION N/A 06/10/2018   Procedure: CARDIOVERSION;  Surgeon: Jolaine Artist, MD;  Location: [facility] ENDOSCOPY;  Service: Cardiovascular;  Laterality: N/A;  . CARDIOVERSION N/A 07/02/2018   Procedure: CARDIOVERSION;  Surgeon: Jolaine Artist, MD;  Location: [facility] ENDOSCOPY;  Service: Cardiovascular;  Laterality: N/A;  . CARDIOVERSION N/A 11/07/2018   Procedure: CARDIOVERSION;  Surgeon: Larey Dresser, MD;  Location: [place];  Service: Cardiovascular;  Laterality: N/A;  . CAROTID ENDARTERECTOMY Left ~ 2007  . CATARACT EXTRACTION W/ INTRAOCULAR LENS  IMPLANT, BILATERAL Bilateral 2000  . CHOLECYSTECTOMY  12/15/?2010  . COLONOSCOPY WITH PROPOFOL N/A 06-24-202018   Procedure: COLONOSCOPY WITH PROPOFOL;  Surgeon: Ronald Lobo, MD;  Location: [place];  Service: Endoscopy;  Laterality: N/A;  . CORONARY ANGIOPLASTY  1992  . CORONARY ANGIOPLASTY WITH STENT PLACEMENT      "last one was 11/2012  (05/25/2013)  . CYSTOGRAM N/A 09/19/2018   Procedure: CYSTOGRAM;  Surgeon: Lucas Mallow, MD;  Location: [place];  Service: Urology;  Laterality: N/A;  . CYSTOSCOPY W/ RETROGRADES Bilateral 05/16/2018   Procedure: CYSTOSCOPY WITH RETROGRADE PYELOGRAM;  Surgeon: Lucas Mallow, MD;  Location: [place];  Service: Urology;  Laterality: Bilateral;  . CYSTOSCOPY WITH RETROGRADE PYELOGRAM, URETEROSCOPY AND STENT PLACEMENT Left 08/09/2014   Procedure: CYSTOSCOPY WITH RETROGRADE PYELOGRAM, URETEROSCOPY AND STENT PLACEMENT;  Surgeon: Bernestine Amass, MD;  Location: [facility];  Service: Urology;  Laterality: Left;  . CYSTOSCOPY WITH RETROGRADE PYELOGRAM, URETEROSCOPY AND STENT PLACEMENT Left 09/01/2014   Procedure: CYSTOSCOPY WITH URETEROSCOPY AND STENT REMOVAL;  Surgeon: Bernestine Amass, MD;  [facility];  Service: Urology;  Laterality: Left;  . CYSTOSCOPY WITH RETROGRADE PYELOGRAM, URETEROSCOPY AND STENT PLACEMENT Left 09/20/2014   Procedure: CYSTOSCOPY WITH RETROGRADE PYELOGRAM, URETEROSCOPY AND STENT PLACEMENT, stone removal;  Surgeon: Bernestine Amass, MD;  [facility];  Service: Urology;  Laterality: Left;  . CYSTOSCOPY WITH RETROGRADE PYELOGRAM, URETEROSCOPY AND STENT PLACEMENT Bilateral 12/04/2017   Procedure: CYSTOSCOPY WITH BILATERAL RETROGRADE PYELOGRAM, LEFT URETEROSCOPY AND STENT PLACEMENT;  Surgeon: Lucas Mallow, MD;  Location: [place];  Service: Urology;  Laterality: Bilateral;  . CYSTOSCOPY WITH RETROGRADE PYELOGRAM, URETEROSCOPY AND STENT PLACEMENT Left 09/19/2018   Procedure: left  RETROGRADE PYELOGRAM;  Surgeon: Lucas Mallow, MD;  Location: [place];  Service: Urology;  Laterality: Left;  . DOPPLER ECHOCARDIOGRAPHY  2011  . ELBOW ARTHROSCOPY Right 1990's  . ELECTROPHYSIOLOGIC STUDY N/A 07/19/2016   Procedure: Cardioversion;  Surgeon: Evans Lance, MD;  Location: [place] CV LAB;  Service: Cardiovascular;  Laterality: N/A;  . ENDARTERECTOMY Right  01/13/2016   Procedure: RIGHT CAROTID ARTERY ENDARTERECTOMY;  Surgeon: Mal Misty, MD;  Location: [place];  Service: Vascular;  Laterality: Right;  . ENTEROSCOPY Left 02/23/2017   Procedure: ENTEROSCOPY;  Surgeon: Ronald Lobo, MD;  Location: [facility] ENDOSCOPY;  Service: Endoscopy;  Laterality: Left;  . ENTEROSCOPY N/A 10/17/2017   Procedure: ENTEROSCOPY;  Surgeon: Yetta Flock, MD;  Location: [facility] ENDOSCOPY;  Service: Gastroenterology;  Laterality: N/A;  LVAD pt.    . ENTEROSCOPY N/A 10/24/2018   Procedure: ENTEROSCOPY;  Surgeon: Rush Landmark Telford Nab., MD;  Location: [place];  Service: Gastroenterology;  Laterality: N/A;  . ESOPHAGOGASTRODUODENOSCOPY N/A 02/15/2015   Procedure: ESOPHAGOGASTRODUODENOSCOPY (EGD);  Surgeon: Carol Ada, MD;  Location: [facility] ENDOSCOPY;  Service: Endoscopy;  Laterality: N/A;  LVAD  . EYE SURGERY     VITRECTOMY  LEFT 05/2012  .  FEMORAL-POPLITEAL BYPASS GRAFT Right 2011  . FEMORAL-POPLITEAL BYPASS GRAFT Left 05/2002   Archie Endo 05/06/2002 (05/25/2013)  . GIVENS CAPSULE STUDY N/A 10/25/2018   Procedure: GIVENS CAPSULE STUDY;  Surgeon: Thornton Park, MD;  Location: [place];  Service: Gastroenterology;  Laterality: N/A;  . HEEL SPUR SURGERY Right 1990's  . HOLMIUM LASER APPLICATION Left 50/93/2671   Procedure: HOLMIUM LASER APPLICATION;  Surgeon: Bernestine Amass, MD;  [facility];  Service: Urology;  Laterality: Left;  . HOLMIUM LASER APPLICATION Left 2/45/8099   Procedure: HOLMIUM LASER APPLICATION;  Surgeon: Lucas Mallow, MD;  Location: [place];  Service: Urology;  Laterality: Left;  . HOT HEMOSTASIS N/A 10/24/2018   Procedure: HOT HEMOSTASIS (ARGON PLASMA COAGULATION/BICAP);  Surgeon: Irving Copas., MD;  Location: [place];  Service: Gastroenterology;  Laterality: N/A;  . IMPLANTABLE CARDIOVERTER DEFIBRILLATOR GENERATOR CHANGE N/A 05/25/2013   Procedure: IMPLANTABLE CARDIOVERTER DEFIBRILLATOR GENERATOR CHANGE;  Surgeon: Deboraha Sprang,  MD;  Location[facility] CATH LAB;  Service: Cardiovascular;  Laterality: N/A;  . INSERTION OF IMPLANTABLE LEFT VENTRICULAR ASSIST DEVICE N/A 10/19/2013   Procedure: INSERTION OF IMPLANTABLE LEFT VENTRICULAR ASSIST DEVICE;  Surgeon: Gaye Pollack, MD;  Location: [place];  Service: Open Heart Surgery;  Laterality: N/A;  CIRC ARREST  NITRIC OXIDE  MEDTRONIC ICD  . INTRAOPERATIVE TRANSESOPHAGEAL ECHOCARDIOGRAM N/A 10/19/2013   Procedure: INTRAOPERATIVE TRANSESOPHAGEAL ECHOCARDIOGRAM;  Surgeon: Gaye Pollack, MD;  [facility];  Service: Open Heart Surgery;  Laterality: N/A;  . IR FLUORO GUIDE CV LINE RIGHT  12/10/2017  . IR US GUIDE VASC ACCESS RIGHT  12/10/2017  . KNEE ARTHROSCOPY Bilateral 1990's  . LEAD REVISION N/A 06/05/2013   Procedure: LEAD REVISION;  Surgeon: Thompson Grayer, MD;  Location: [facility] CATH LAB;  Service: Cardiovascular;  Laterality: N/A;  . LEFT HEART CATHETERIZATION WITH CORONARY ANGIOGRAM N/A 11/24/2012   Procedure: LEFT HEART CATHETERIZATION WITH CORONARY ANGIOGRAM;  Surgeon: Peter M Martinique, MD;  Location: [facility] CATH LAB;  Service: Cardiovascular;  Laterality: N/A;  . LIPOMA EXCISION Left 2013   "near ear" (05/25/2013)  . [place]; 2000's  . PATCH ANGIOPLASTY Right 01/13/2016   Procedure: WITH  PATCH ANGIOPLASTY;  Surgeon: Mal Misty, MD;  Location: [place];  Service: Vascular;  Laterality: Right;  . RENAL ARTERY BYPASS Bilateral 2003  . REVASCULARIZATION / IN-SITU GRAFT LEG    . RIGHT HEART CATH N/A 11/29/2017   Procedure: RIGHT HEART CATH;  Surgeon: Jolaine Artist, MD;  Location: [place] CV LAB;  Service: Cardiovascular;  Laterality: N/A;  . RIGHT HEART CATHETERIZATION N/A 09/17/2013   Procedure: RIGHT HEART CATH;  Surgeon: Jolaine Artist, MD;  Location: [facility] CATH LAB;  Service: Cardiovascular;  Laterality: N/A;  . RIGHT HEART CATHETERIZATION N/A 10/14/2013   Procedure: RIGHT HEART CATH;  Surgeon: Jolaine Artist, MD;  Location: [facility] CATH LAB;  Service:  Cardiovascular;  Laterality: N/A;  . RIGHT HEART CATHETERIZATION N/A 11/16/2013   Procedure: RIGHT HEART CATH;  Surgeon: Jolaine Artist, MD;  Location: [facility] CATH LAB;  Service: Cardiovascular;  Laterality: N/A;  . RIGHT HEART CATHETERIZATION N/A 07/13/2014   Procedure: RIGHT HEART CATH;  Surgeon: Jolaine Artist, MD;  Location: [facility] CATH LAB;  Service: Cardiovascular;  Laterality: N/A;  . TEE WITHOUT CARDIOVERSION N/A 11/04/2013   Procedure: TRANSESOPHAGEAL ECHOCARDIOGRAM (TEE);  Surgeon: Larey Dresser, MD;  Location: [place];  Service: Cardiovascular;  Laterality: N/A;  . TEE WITHOUT CARDIOVERSION N/A 03/02/2015   Procedure: TRANSESOPHAGEAL  ECHOCARDIOGRAM (TEE);  Surgeon: Jolaine Artist, MD;  Location: [facility] ENDOSCOPY;  Service: Cardiovascular;  Laterality: N/A;  . TEE WITHOUT CARDIOVERSION N/A 01/19/2016   Procedure: TRANSESOPHAGEAL ECHOCARDIOGRAM (TEE);  Surgeon: Larey Dresser, MD;  Location: [place];  Service: Cardiovascular;  Laterality: N/A;  . TONSILLECTOMY  1946  . TRANSURETHRAL RESECTION OF BLADDER TUMOR N/A 12/04/2017   Procedure: TRANSURETHRAL RESECTION OF BLADDER TUMOR (TURBT);  Surgeon: Lucas Mallow, MD;  Location: [place];  Service: Urology;  Laterality: N/A;  . TRANSURETHRAL RESECTION OF BLADDER TUMOR N/A 05/16/2018   Procedure: TRANSURETHRAL RESECTION OF BLADDER TUMOR (TURBT);  Surgeon: Lucas Mallow, MD;  Location: [place];  Service: Urology;  Laterality: N/A;  . TRANSURETHRAL RESECTION OF BLADDER TUMOR WITH MITOMYCIN-C N/A 09/19/2018   Procedure: TRANSURETHRAL RESECTION OF BLADDER TUMOR;  Surgeon: Lucas Mallow, MD;  Location: [place];  Service: Urology;  Laterality: N/A;  . TUMOR EXCISION Left 1960's   "fatty tumor" (05/25/2013)  . V-TACH ABLATION N/A 09/12/2011   Procedure: V-TACH ABLATION;  Surgeon: Evans Lance, MD;  Location: [facility] CATH LAB;  Service: Cardiovascular;  Laterality: N/A;  . V-TACH ABLATION N/A 06/08/2013   Procedure: V-TACH ABLATION;   Surgeon: Evans Lance, MD;  Location: [facility] CATH LAB;  Service: Cardiovascular;  Laterality: N/A;      Allergies Demerol [meperidine] and Coconut oil  Family History  Problem Relation Age of Onset  . Heart attack Mother   . Coronary artery disease Mother   . Cancer Mother   . Heart disease Mother   . Hyperlipidemia Mother   . Hypertension Mother   . Coronary artery disease Father   . Stroke Father   . Cancer Father   . Heart disease Father        before age 19  . Hyperlipidemia Father   . Hypertension Father   . Heart attack Father   . Coronary artery disease Other     Social History Social History   Tobacco Use  . Smoking status: Former Smoker    Packs/day: 0.50    Years: 37.00    Pack years: 18.50    Types: Cigarettes    Last attempt to quit: 11/05/2001    Years since quitting: 17.0  . Smokeless tobacco: Never Used  Substance Use Topics  . Alcohol use: Yes    Alcohol/week: 0.0 standard drinks    Comment: 05/25/2013 "mixed drink couple times/month"  . Drug use: No    Review of Systems  All other systems negative except as documented in the HPI. All pertinent positives and negatives as reviewed in the HPI. ____________________________________________   PHYSICAL EXAM:  VITAL SIGNS: Vitals:   11/24/18 0045 11/24/18 0235 11/24/18 0400 11/24/18 0600  BP: (!) 120/100 (!) 120/102 (!) 124/109   Pulse: 87 84 85 86  Resp: (!) 22 16 13 13   Temp: (!) 96.2 F (35.7 C) 98.6 F (37 C) 98.4 F (36.9 C)   TempSrc: Temporal Oral Oral   SpO2: 94%     Weight:  84.7 kg    Height:  6' (1.829 m)       Constitutional: Alert and oriented. Well appearing and in no acute distress. Eyes: Conjunctivae are normal. PERRL. EOMI. Head: Atraumatic. Nose: No congestion/rhinnorhea. Mouth/Throat: Mucous membranes are moist.  Oropharynx non-erythematous. Neck: No stridor.  No meningeal signs.   Cardiovascular: Normal rate, regular rhythm. Good peripheral circulation. Grossly  normal heart sounds.   Respiratory: Normal respiratory effort.  No retractions.  Lungs CTAB. Gastrointestinal: Soft and nontender. No distention.  Musculoskeletal: No lower extremity tenderness nor edema. No gross deformities of extremities. Neurologic:  Normal speech and language. No gross focal neurologic deficits are appreciated.  Skin:  Skin is warm, dry and intact. No rash noted.   ____________________________________________   LABS (all labs ordered are listed, but only abnormal results are displayed)  Labs Reviewed  PROTIME-INR - Abnormal; Notable for the following components:      Result Value   Prothrombin Time 31.9 (*)    All other components within normal limits  COMPREHENSIVE METABOLIC PANEL - Abnormal; Notable for the following components:   Glucose, Bld 108 (*)    Creatinine, Ser 1.81 (*)    Calcium 8.8 (*)    AST 118 (*)    ALT 91 (*)    Alkaline Phosphatase 143 (*)    GFR calc non Af [name] 35 (*)    GFR calc Af [name] 40 (*)    All other components within normal limits  LACTATE DEHYDROGENASE - Abnormal; Notable for the following components:   LDH 376 (*)    All other components within normal limits  URINALYSIS, ROUTINE W REFLEX MICROSCOPIC - Abnormal; Notable for the following components:   Hgb urine dipstick LARGE (*)    Leukocytes, UA TRACE (*)    RBC / HPF >50 (*)    Bacteria, UA RARE (*)    All other components within normal limits  CBC WITH DIFFERENTIAL/PLATELET - Abnormal; Notable for the following components:   RBC 3.12 (*)    Hemoglobin 10.2 (*)    HCT 33.8 (*)    MCV 108.3 (*)    RDW 21.8 (*)    nRBC 0.6 (*)    Lymphs Abs 0.6 (*)    Monocytes Absolute 1.2 (*)    All other components within normal limits  CBC - Abnormal; Notable for the following components:   RBC 3.15 (*)    Hemoglobin 10.3 (*)    HCT 33.4 (*)    MCV 106.0 (*)    RDW 21.9 (*)    All other components within normal limits  BASIC METABOLIC PANEL - Abnormal; Notable for the  following components:   Glucose, Bld 103 (*)    Creatinine, Ser 1.76 (*)    Calcium 8.6 (*)    GFR calc non Af [name] 36 (*)    GFR calc Af [name] 42 (*)    All other components within normal limits  TSH - Abnormal; Notable for the following components:   TSH 7.089 (*)    All other components within normal limits  MRSA PCR SCREENING  URINE CULTURE  URINALYSIS, ROUTINE W REFLEX MICROSCOPIC   ____________________________________________  EKG   EKG Interpretation  Date/Time:    Ventricular Rate:    PR Interval:    QRS Duration:   QT Interval:    QTC Calculation:   R Axis:     Text Interpretation:         ____________________________________________  RADIOLOGY  Dg Chest Portable 1 View  Result Date: 11/24/2018 CLINICAL DATA:  [place] in bathroom with generalized weakness EXAM: PORTABLE CHEST 1 VIEW COMPARISON:  09/17/2018 FINDINGS: Post sternotomy changes. Partially visualized LVAD. Left-sided pacing device as before. Cardiomegaly with aortic atherosclerosis. Mild central congestion. Suspected small pleural effusions. No pneumothorax. IMPRESSION: Cardiomegaly with vascular congestion. Probable small pleural effusions. Mild diffuse coarse interstitial opacity, some of which is suspected to to be secondary to chronic interstitial change. Electronically Signed   By:  Donavan Foil M.D.   On: 11/24/2018 01:38   [place] Ekg Site Rite  Result Date: 11/24/2018 If Site Rite image not attached, placement could not be confirmed due to current cardiac rhythm.   ____________________________________________   PROCEDURES  Procedure(s) performed:   Procedures   ____________________________________________   INITIAL IMPRESSION / ASSESSMENT AND PLAN / ED COURSE  eval for weakness. LVAD team aware.   Seen by Dr. Haroldine Laws. Admit.      Pertinent labs & imaging results that were available during my care of the patient were reviewed by me and considered in my medical decision making (see  chart for details).  ____________________________________________  FINAL CLINICAL IMPRESSION(S) / ED DIAGNOSES  Final diagnoses:  None     MEDICATIONS GIVEN DURING THIS VISIT:  Medications  amiodarone (PACERONE) tablet 200 mg (has no administration in time range)  atorvastatin (LIPITOR) tablet 80 mg (has no administration in time range)  midodrine (PROAMATINE) tablet 10 mg (10 mg Oral Given 11/24/18 0646)  sotalol (BETAPACE) tablet 80 mg (has no administration in time range)  temazepam (RESTORIL) capsule 30 mg (has no administration in time range)  levothyroxine (SYNTHROID, LEVOTHROID) tablet 150 mcg (150 mcg Oral Given 11/24/18 0644)  docusate sodium (COLACE) capsule 100 mg (has no administration in time range)  dronabinol (MARINOL) capsule 2.5 mg (has no administration in time range)  pantoprazole (PROTONIX) EC tablet 40 mg (40 mg Oral Given 11/24/18 0645)  multivitamin with minerals tablet 1 tablet (has no administration in time range)  protein supplement (ENSURE MAX) liquid (has no administration in time range)  acetaminophen (TYLENOL) tablet 650 mg (has no administration in time range)  ondansetron (ZOFRAN) injection 4 mg (has no administration in time range)  promethazine (PHENERGAN) tablet 12.5 mg (has no administration in time range)     NEW OUTPATIENT MEDICATIONS STARTED DURING THIS VISIT:  Current Discharge Medication List      Note:  This note was prepared with assistance of Dragon voice recognition software. Occasional wrong-word or sound-a-like substitutions may have occurred due to the inherent limitations of voice recognition software.  Adamariz Gillott, Corene Cornea, MD 11/24/18 [phone]

## 2018-11-24 NOTE — ED Triage Notes (Addendum)
Pt from home after collapsing per wife. Fell in the bathroom, denies LOC. Pt reports generalized weakness at home. Appears SOB, pt states his sob is at baseline. RLE  noted. Denies any pain at present. Pt also receiving radiation for for bladder cancer

## 2018-11-24 NOTE — Progress Notes (Signed)
Peripherally Inserted Central Catheter/Midline Placement  The IV Nurse has discussed with the patient and/or persons authorized to consent for the patient, the purpose of this procedure and the potential benefits and risks involved with this procedure.  The benefits include less needle sticks, lab draws from the catheter, and the patient may be discharged home with the catheter. Risks include, but not limited to, infection, bleeding, blood clot (thrombus formation), and puncture of an artery; nerve damage and irregular heartbeat and possibility to perform a PICC exchange if needed/ordered by physician.  Alternatives to this procedure were also discussed.  Bard Power PICC patient education guide, fact sheet on infection prevention and patient information card has been provided to patient /or left at bedside.    PICC/Midline Placement Documentation  PICC Double Lumen 11/24/18 PICC Right Cephalic 41 cm 0 cm (Active)  Indication for Insertion or Continuance of Line Home intravenous therapies (PICC only) 11/24/2018 12:00 PM  Exposed Catheter (cm) 0 cm 11/24/2018 12:00 PM  Site Assessment Clean;Dry;Intact 11/24/2018 12:00 PM  Lumen #1 Status Flushed;Saline locked;Blood return noted 11/24/2018 12:00 PM  Lumen #2 Status Flushed;Saline locked;Blood return noted 11/24/2018 12:00 PM  Dressing Type Transparent;Securing device 11/24/2018 12:00 PM  Dressing Status Clean;Dry;Intact;Antimicrobial disc in place 11/24/2018 12:00 PM  Line Care Connections checked and tightened 11/24/2018 12:00 PM  Dressing Intervention New dressing 11/24/2018 12:00 PM  Dressing Change Due 12/01/18 11/24/2018 12:00 PM       Virgilio Belling 11/24/2018, 12:42 PM

## 2018-11-24 NOTE — Progress Notes (Signed)
ANTICOAGULATION CONSULT NOTE - Initial Consult  Pharmacy Consult for Coumadin Indication: LVAD  Allergies  Allergen Reactions  . Demerol [Meperidine] Other (See Comments)    Paralysis/ Could only move eyes  . Coconut Oil Hives    Patient Measurements: Height: 6' (182.9 cm) Weight: 186 lb 11.7 oz (84.7 kg) IBW/kg (Calculated) : 77.6 Heparin Dosing Weight: 84 kg  Vital Signs: Temp: 98.4 F (36.9 C) (01/20 0400) Temp Source: Oral (01/20 0400) BP: 124/109 (01/20 0400) Pulse Rate: 87 (01/20 0800)  Labs: Recent Labs    11/24/18 0108 11/24/18 0607  HGB 10.2* 10.3*  HCT 33.8* 33.4*  PLT 173 160  LABPROT 31.9*  --   INR 3.15  --   CREATININE 1.81* 1.76*    Estimated Creatinine Clearance: 37.4 mL/min (A) (by C-G formula based on SCr of 1.76 mg/dL (H)).   Medical History: Past Medical History:  Diagnosis Date  . Arthritis   . Automatic implantable cardioverter-defibrillator in situ    a. s/p Medtronic Evera device serial [id number]    . Bradycardia   . CAD (coronary artery disease)    a. s/p MI 1982;  s/p DES to CFX 2011;  s/p NSTEMI 4/12 in setting of VTach;  cath 7/12:  LM ok, LAD mid 30-40%, Dx's with 40%, mCFX stent patent, prox to mid RCA occluded with R to R and L to R collats.  Medical Tx was continued  . Chronic systolic heart failure ([place])    a. s/p LVAD 10/2013 for destination therapy  . CKD (chronic kidney disease)   . CVD (cerebrovascular disease)    a. s/p L CEA 2008  . Cytopenia   . Dysrhythmia    AFIB  . History of GI bleed    a. on ASA- started on plavix during 10/2015 admission for CVA  . HLD (hyperlipidemia)   . HTN (hypertension)   . Hypothyroidism   . Inappropriate therapy from implantable cardioverter-defibrillator    a. ATP for sinus tach SVT wavelet identified SVT but was no  passive (2014)  . Ischemic cardiomyopathy   . Melanoma of ear ([place])    a. L Ear   . Myocardial infarction ([place])    1992  . PAF (paroxysmal atrial fibrillation)  ([place])   . PVD (peripheral vascular disease) ([place])    a. 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 Shortness of breath dyspnea    W/ EXERTION   . Sleep apnea    NO CPAP NOW  HE SENT IT BACK YRS AGO  . Stroke [facility])    a. 10/2015 admission   . Ventricular tachycardia ([place])    a. s/p AICD;   h/o ICD shock;  Amiodarone Rx. S/P ablation in 2012    Medications:  Scheduled:  . amiodarone  200 mg Oral BID  . atorvastatin  80 mg Oral QHS  . docusate sodium  100 mg Oral Daily  . dronabinol  2.5 mg Oral BID AC  . levothyroxine  150 mcg Oral QAC breakfast  . midodrine  10 mg Oral TID WC  . multivitamin with minerals  1 tablet Oral Daily  . pantoprazole  40 mg Oral BID AC  . ENSURE MAX PROTEIN  11 oz Oral Daily  . sodium chloride flush  10-40 mL Intracatheter Q12H  . sotalol  80 mg Oral BID    Assessment:  78 yo male on chronic warfarin for LVAD with hx recurrent GIB and bladder tumor.  No hematuria or black stools currently.  INR at admit above goal range.  Most recent home dose: Warfarin 1 mg daily  Goal of Therapy:  INR 2-2.5 (keep closer to 2) Monitor platelets by anticoagulation protocol: Yes   Plan:  No Coumadin tonight Daily INR.  Marguerite Olea, [facility] Clinical Pharmacist Phone [phone]  11/24/2018 2:02 PM

## 2018-11-24 NOTE — H&P (Signed)
Patient ID: [name], male   DOB: 16-Dec-1940, 78 y.o.   MRN: [id number]   VAD H&P NOTE  PCP: Dr. Kandice Robinsons ([place]) Primary HF: Dr Darlyn Read [name] is a 78 y.o. male  with h/o VT, PAD and severe systolic HF (EF 51-88%) due to mixed ischemic/nonischemic CM he is s/p HM II LVAD implant for destination therapy on October 19, 2013.VAD implantation was complicated by VT, syncope, AF/Aflutter and RHF. Required short term milrinone.   GU Event 09/01/14 had impacted ureteral stone and underwent cystoscopy and flexible ureteroscopy with lithotripsy and basket extraction.  09/2015 Hematuria and chronic nephrolithiasis. Renal US showed mild hyronephrosis and bilateral renal cysts. Dr. Risa [name] took back for repeat lithotripsy and stone extraction by ureteroscopy and J stent placement. 12/18 Recurrent hematuria. 80mm left ureteral stone. Passed with tamulosin 1/19   Admitted with recurrent hematuria with ureteral stone. Stone removed but found to have large bladder mass which was removed. Course c/b obstructive uropathy and AKI requiring 1 session of HD.  3/19 Bladder chemo stopped after 2 treatments as unable to tolerate 05/16/18 s/p TURB with right ureteral stent placement.  09/2018 Recurrent bladder tumor excision with persistent hematuria requiring XRT  GI Event  02/2015 - Hgb 5.4 --> EGD that showed 2 AVMs in the jejunum that were clipped.  4/2-18 - Episode of melena. Hgb 10.9-> 8.7. EGD/colonoscopy negative 12/18 - Melena with syncope  Hgb 6.5. Transfused 5u. EGD with 2 AVMs with APC 10/24/18 EGD showed one non-bleeding AVM with APC.  10/25/18 Capsule results delayed due to software upgrade and holiday.    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  01/2016 R CEA. He was started on Plavix.  01/2016 Lumbar fusion L3-L5 with pedicle screws posterolateral arthrodesis with BMP, allograft  VT events 9/17 with high fevers and productive cough. Treated with levaquin x 7 days for  acute bronchitis. Also found to have VT and underwent DC-CV. Seen by EP and amio increased 10/17 speed turned down to 8800 due to RV failure. 10/18 with slow VT and ADHF.Cardioverted in ED and amio uptitrated.  04/01/18 recurrent VT. Underwent DC-CV in ErR9/4/19 presented to ED. Urgently cardioverted. EP started on Sotolol.  11/07/18 Recurrent VT - DC-CV in ER  Significant VAD Events  03/2017>percutaneous lead repair  Hospitalized 09/17/18-10/01/18 for heparin bridge for recurrent bladder tumor resection on 09/19/18. Renal US showed recurrent right hydronephrosis without BOO and evidence of recurrent bladder tumor. Underwent recurrent bladder tumor resection 09/19/18 with Dr [name].Found to be large fast-growing tumor with obstruction of right ureteral orifice (unable to uncover right ureteral orifice). 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 Had persistent hematuria and underwent placement of 3-way Foley with bladder irrigation and started palliative XRT.   Ramp echo done 11/14 and speed turned down to 8600. He had intermittent low flows and he was given IV fluids.   Admitted 12/18 - 10/28/18 from home with low flows and malaise. Found to be anemic. Given 1 uPRBCs, GI consulted for EGD which showed one non-bleeding AVM with APC. Capsule with 2 clear AVMs noted, one proximal duodenum and the other suspected jejunum. Pt continued to have intermittent low flows 12/23 and 10/28/18. Florinef added 10/26/18. Midodrine added 10/27/18. Results of capsule endo showed:prolonged small bowel transit of 9 hours and 44 minutes. 2 clear AVMs noted, one proximal duodenum, the other mid jejunum (suspected). No active bleeding or other pathology noted otherwise.   Was admitted 1/3-11/09/18 with  recurrent VT. Underwent DC-CV. On 1/12 received ICD shock at home.   Over past few days increasing fatigue. Poor appetite. Unable to stand or do ADLs. Denies fever,  bleeding or problems with driveline. I interrogated his device remotely earlier today and was in NSR. No VT. Volume markedly elevated. Told to take a lasi. Took lasix 20mg  po with good urine output. This evening got up to go to bathroom and felt very weak. Unable to stand up. His wife called EMS to load him in car and brought here.  In ER, MAP 100. CXR with CHF. Hgb 10.2. Multiple low flows over the weekend on VAD   VAD Indication: Destination Therapy- age excluding    VAD interrogation & Equipment Management: Speed: 8400 Flow:  3.1 Power: 3.8w PI: 5.5  Alarms: Multiple low flows throughout the weekend  Fixed speed 8400 Low speed limit: 8000   Past Medical History:  Diagnosis Date  . Arthritis   . Automatic implantable cardioverter-defibrillator in situ    a. s/p Medtronic Evera device serial [id number]    . Bradycardia   . CAD (coronary artery disease)    a. s/p MI 1982;  s/p DES to CFX 2011;  s/p NSTEMI 4/12 in setting of VTach;  cath 7/12:  LM ok, LAD mid 30-40%, Dx's with 40%, mCFX stent patent, prox to mid RCA occluded with R to R and L to R collats.  Medical Tx was continued  . Chronic systolic heart failure ([place])    a. s/p LVAD 10/2013 for destination therapy  . CKD (chronic kidney disease)   . CVD (cerebrovascular disease)    a. s/p L CEA 2008  . Cytopenia   . Dysrhythmia    AFIB  . History of GI bleed    a. on ASA- started on plavix during 10/2015 admission for CVA  . HLD (hyperlipidemia)   . HTN (hypertension)   . Hypothyroidism   . Inappropriate therapy from implantable cardioverter-defibrillator    a. ATP for sinus tach SVT wavelet identified SVT but was no  passive (2014)  . Ischemic cardiomyopathy   . 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 L Ear   . Myocardial infarction ([place])    1992  . PAF (paroxysmal atrial fibrillation) ([place])   . PVD (peripheral vascular disease) ([place])    a. h/o claudication;  s/p R fem to pop BPG 3/11;  s/p L CFA BPG 7/03;  s/p repair  of inf AAA 6/03;  s/p aorta to bilat renal BPG 6/03  . Shortness of breath dyspnea    W/ EXERTION   . Sleep apnea    NO CPAP NOW  HE SENT IT BACK YRS AGO  . Stroke [facility])    a. 10/2015 admission   . Ventricular tachycardia ([place])    a. s/p AICD;   h/o ICD shock;  Amiodarone Rx. S/P ablation in 2012    No current facility-administered medications for this encounter.    Current Outpatient Medications  Medication Sig Dispense Refill  . amiodarone (PACERONE) 200 MG tablet Take 1 tablet (200 mg total) by mouth 2 (two) times daily. 120 tablet 5  . atorvastatin (LIPITOR) 80 MG tablet Take 1 tablet (80 mg total) by mouth at bedtime. 90 tablet 1  . docusate sodium (COLACE) 100 MG capsule Take 100 mg by mouth daily.     [name] dronabinol (MARINOL) 2.5 MG capsule Take 1 capsule (2.5 mg total) by mouth 2 (two) times daily before lunch and supper. 60 capsule  3  . fludrocortisone (FLORINEF) 0.1 MG tablet Take 1 tablet (0.1 mg total) by mouth 2 (two) times daily. 60 tablet 6  . furosemide (LASIX) 20 MG tablet Take 20 mg by mouth daily as needed for fluid.     [name] levothyroxine (SYNTHROID, LEVOTHROID) 150 MCG tablet Take 1 tablet (150 mcg total) by mouth daily before breakfast. 90 tablet 3  . midodrine (PROAMATINE) 10 MG tablet Take 1 tablet (10 mg total) by mouth 3 (three) times daily with meals. 90 tablet 3  . Multiple Vitamin (MULTIVITAMIN WITH MINERALS) TABS tablet Take 1 tablet by mouth daily.    . pantoprazole (PROTONIX) 40 MG tablet TAKE 1 TABLET TWICE DAILY (Patient taking differently: Take 40 mg by mouth 2 (two) times daily before a meal. ) 180 tablet 6  . potassium chloride SA (K-DUR,KLOR-CON) 20 MEQ tablet Take 1 tablet (20 mEq total) by mouth daily. 90 tablet 3  . promethazine (PHENERGAN) 12.5 MG tablet Take 12.5 mg by mouth every 6 (six) hours as needed for nausea or vomiting.    . protein supplement shake (PREMIER PROTEIN) LIQD Take 11 oz by mouth once.     . sotalol (BETAPACE) 80 MG tablet Take 1  tablet (80 mg total) by mouth 2 (two) times daily. 60 tablet 6  . spironolactone (ALDACTONE) 25 MG tablet Take 0.5 tablets (12.5 mg total) by mouth daily. 90 tablet 3  . temazepam (RESTORIL) 30 MG capsule Take 1 capsule (30 mg total) by mouth at bedtime as needed for sleep. 30 capsule 3  . warfarin (COUMADIN) 2 MG tablet Take 2 mg daily, further as directed by VAD clinic. (Patient taking differently: Take 1 mg by mouth See admin instructions. 1mg  daily)      Demerol [meperidine] and Coconut oil  Review of systems complete and found to be negative unless listed in HPI.    Vitals:   11/24/18 0043 11/24/18 0045  BP: (!) 102/0 (!) 120/100  Pulse:  87  Resp:  (!) 22  Temp:  (!) 96.2 F (35.7 C)  TempSrc:  Temporal  SpO2:  94%    Physical exam: General:  Pale weak.  HEENT: normal  Neck: supple. JVP nto jaw with prominent v-waves.  Carotids 2+ bilat; no bruits. No lymphadenopathy or thryomegaly appreciated. Cor: LVAD hum.  Lungs: Mild basilar crackles Abdomen: obese soft, nontender, non-distended. No hepatosplenomegaly. No bruits or masses. Good bowel sounds. Driveline site clean. Anchor in place.  Extremities: no cyanosis, clubbing, rash. Warm 1+ edema  Neuro: alert & oriented x 3. No focal deficits. Moves all 4 without problem    ECG: NSR 86 with RBBB Personally reviewed   ASSESSMENT AND PLAN:   1. 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 Place PICC to check CVP and co-ox. May need RHC - Suspect he will need trial of dobutamine - Check thryroid  2.h/o recentVTwith hypotension and low flow alarms on VAD: Underwent DC-CV 9/17, 08/17/17, and 04/01/18.  S/p emergent DC-CV in ER 07/09/18. S/p DC-CV in ER on 1/3 - Had recurrent VT on 1/12 at rate ~200 bpm. Failed ATP which sped up VT to ~300 bpm and then was shocked x 1 - Device reprogrammed recently  to avoid shocks at home. VT zone 118-230 monitor only. No ATP. > 230  Shock - In NSR today. Device interrogation done personally. No recent VT or AF - Continue amio and sotalol for now. May need to stop  sotalol in favor of mexilitene - Will d/w EP - Keep K > 4.0 Mg > 2.0  3. Acute on chronic systolic HF s/p HM-II LVAD 10/2014. s/p previous driveline [id number]. No further pump stops. [place] in 1/19 with mildly low output. Started on milrinone without benefitso it was stopped.  - We have started florinef and have been pushing fluids to avoid low flows.  - Now volume overloaded in setting of probable worsening RV failure.  - Took lasix earlier today. Will hold on further dosing for now.  - NYHA IV - Place PICC to check co-ox and CVP - Consider dobutamine   4. Recurrent high-grade bladder tumor:  - S/p stone removal.  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  Had 2 BCG infusions with Dr. Gloriann [name] but discontinued due to inability to tolerate. - S/p repeat resection 7/19.  - s/p repeat bladder tumor excision in 11/19 with fast-growing tumor. C/b bilateral hydronephrosis and persistent hematuria requiring XRT. - Undergoing palliative XRT with Dr. Tammi Klippel. Denies hematuria.   5. CKD, stage III:  Had AKI in setting of obstructive uropathy and ATN. Required HD x1(11/2017).   - Cr pending today. Baseline ~ 1.5 -Not interested in nephrostomy tubes for hydronephrosis  5. LVAD:  - VAD interrogated personally. Parameters stable. - VAD speed turned down to 8400 previously to help with low flows - INR 3.15 Try to keep INR closer to 2.0. Discussed dosing with PharmD personally. - LDH 225   6. H/o GIB:  s/p clipping of 2 jejunal AVMs in 4/16. Continue coumadin and plavix.(with h/o CVA). Admitted 4/18 with recurrent GIB. Transfused 2u. EGD/colon negative. Admitted 12./18. hgb 6.5, Transfused 5u. EGD with 2 AVMs treated with APC.  - 10/24/18 EGD showed one non-bleeding AVM with APC.  - 10/25/18 Capsule with 2 clear AVMs noted, one proximal duodenum and  the other suspected jejunum. Refuses Octreotide due to cost.  - Hgb 10.2. He denies black stools.   7. Atrial arrhythmias: Atrial fibrillation, atrial tachycardia. s/p multiple DC-CVs last 07/02/18.  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  - Continue amiodarone and sotalol.   8. HTN:  - MAPS stable despite low flows.   9. H/o amio-induced hypethyroidism:  Followed by Dr. Forde Dandy in past.  - Amio restarted due to recurrent atrial arrhythmias and VT.  - Will recheck TFTs today  Glori Bickers, MD  2:07 AM

## 2018-11-24 NOTE — Progress Notes (Signed)
LVAD Coordinator Rounding Note:  Admitted 10/22/18 from ED with Failure to thrive and Low Flows.  HM II Implanted 10/19/13 by Dr. Cyndia Bent under Destination Therapy criteria.  Pt lying in bed this am. States that he is extremely tired and can barely stand.  Vital signs: Temp:  98.4 HR: 87 regular Doppler Pressure: 90 Automatic cuff:  124/109 O2 Sat: 98%on RA  Weight:  186.7  lbs  LVAD interrogation reveals:  Speed: 8400  Flow:2.8 Power: 3.6w PI: 5.8  Alarms: 10-20 Low Flows daily Events:rare  Fixed speed: 8400 Low speed limit: 8000  Drive Line: Driveline dressing C/D/I. Weekly dressings performed by bedside nurse or SO [name]. Next dressing change due 11/28/18.   Labs:  LDH trend: 376  INR trend: 3.15  Hgb trend:  10.3  Blood products this admission: - 10/23/18 > 1 unit PRBCs   Anticoagulation Plan: - INR Goal: 2.0 - 2.5 (coumadin on hold due to current GI bleed) - ASA Dose: no ASA (hx of GI bleed)   Device: - Dual Medtronic ICD - VF therapies: on at 200 bpm - AT/AF monitor on: 140 bpm  - Last device check 09/30/18 per Tomi Bamberger; underlying rhythm [place]enckeback  Significant Events on VAD Support:  09/01/14>>ureteral stone with cystoscopy and flexible ureteroscopy with lithotripsy and basket extraction 02/2015 >>GIB with 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 Started on Plavix 3/17>>Lumbar fusion L3-L5 9/17>>VT with DC-CV; amio increased 10/17>>ramp with RV failure; speed decreased to 8800 02/2017> GIB (removed ASA, scopes negative) 03/2017>percutaneous lead repair after pump stops  10/18>>Slow VT. Cardioverted in ED and amiodarone uptitrated 12/18>>melena with scope. Hgb 6.5. EGD with 2 AVMs with APC 12/18>>recurrent hematuria. 6 mm left ureteral stone 1/19>>7 mm left ureteral stone removal with laser  lithotripsy; left ureteral stent. Resection of 6 cm bladder tumor 11/19>>Bladder tumor resections. Gross hematuria requiring CBI and radiation treatments.   Plan/Recommendations:  1. Please call VAD pager if any issues with VAD equipment or questions, concerns. 2. Weekly dressing changes, next dressing change due 11/28/18. Bedside nurse may change dressing.    Tanda Rockers RN [place] Coordinator  Office: [phone]  24/7 Pager: [phone]

## 2018-11-24 NOTE — Plan of Care (Signed)
Patient slowly progressing toward care goals.  PICC placed today.  Patient on Dobutamine and states he knows he may have to go home on Dobutamine gtt.  Patient states he is still quite fatigued.  Unsure of how/when next six radiation treatments will take place - states Dr. Haroldine Laws is discussing with the radiation oncologist.  LVAD continues to show low flow - 2.6 at 2000 this evening.  Several low flow events at 2.4 noted since 1700.  Patient is asymptomatic with these events.  VAD Coordinators and MD aware of low flows.  CoOx and CVPs with slight improvements.  Will continue to monitor.

## 2018-11-24 NOTE — Progress Notes (Signed)
Had low flow alarm to 2.4 for 3sec,. Asymptomatic map-91. L Vad coord.made aware by the charge nurse. Continue to monitor.

## 2018-11-25 ENCOUNTER — Ambulatory Visit: Payer: Medicare Other

## 2018-11-25 DIAGNOSIS — I50810 Right heart failure, unspecified: Secondary | ICD-10-CM

## 2018-11-25 LAB — CBC
HCT: 27.6 % — ABNORMAL LOW (ref 39.0–52.0)
Hemoglobin: 8.3 g/dL — ABNORMAL LOW (ref 13.0–17.0)
MCH: 32.5 pg (ref 26.0–34.0)
MCHC: 30.1 g/dL (ref 30.0–36.0)
MCV: 108.2 fL — AB (ref 80.0–100.0)
Platelets: 121 10*3/uL — ABNORMAL LOW (ref 150–400)
RBC: 2.55 MIL/uL — ABNORMAL LOW (ref 4.22–5.81)
RDW: 21.7 % — ABNORMAL HIGH (ref 11.5–15.5)
WBC: 5.1 10*3/uL (ref 4.0–10.5)
nRBC: 0 % (ref 0.0–0.2)

## 2018-11-25 LAB — BASIC METABOLIC PANEL
Anion gap: 4 — ABNORMAL LOW (ref 5–15)
BUN: 20 mg/dL (ref 8–23)
CO2: 25 mmol/L (ref 22–32)
Calcium: 8.4 mg/dL — ABNORMAL LOW (ref 8.9–10.3)
Chloride: 111 mmol/L (ref 98–111)
Creatinine, Ser: 1.51 mg/dL — ABNORMAL HIGH (ref 0.61–1.24)
GFR calc Af Amer: 50 mL/min — ABNORMAL LOW (ref >60)
GFR calc non Af Amer: 43 mL/min — ABNORMAL LOW (ref >60)
Glucose, Bld: 96 mg/dL (ref 70–99)
Potassium: 3.9 mmol/L (ref 3.5–5.1)
Sodium: 140 mmol/L (ref 135–145)

## 2018-11-25 LAB — COOXEMETRY PANEL
CARBOXYHEMOGLOBIN: 2.1 % — AB (ref 0.5–1.5)
Methemoglobin: 1.6 % — ABNORMAL HIGH (ref 0.0–1.5)
O2 Saturation: 57.5 %
Total hemoglobin: 8.4 g/dL — ABNORMAL LOW (ref 12.0–16.0)

## 2018-11-25 LAB — PREPARE RBC (CROSSMATCH)

## 2018-11-25 LAB — MAGNESIUM: Magnesium: 1.8 mg/dL (ref 1.7–2.4)

## 2018-11-25 LAB — PROTIME-INR
INR: 3.06
Prothrombin Time: 31.2 seconds — ABNORMAL HIGH (ref 11.4–15.2)

## 2018-11-25 LAB — LACTATE DEHYDROGENASE: LDH: 278 U/L — ABNORMAL HIGH (ref 98–192)

## 2018-11-25 LAB — T4, FREE: Free T4: 1.44 ng/dL (ref 0.82–1.77)

## 2018-11-25 MED ORDER — SODIUM CHLORIDE 0.9% IV SOLUTION
Freq: Once | INTRAVENOUS | Status: AC
  Administered 2018-11-25: 09:00:00 via INTRAVENOUS

## 2018-11-25 MED ORDER — POTASSIUM CHLORIDE CRYS ER 20 MEQ PO TBCR
40.0000 meq | EXTENDED_RELEASE_TABLET | Freq: Once | ORAL | Status: AC
  Administered 2018-11-25: 40 meq via ORAL
  Filled 2018-11-25: qty 2

## 2018-11-25 MED ORDER — MAGNESIUM SULFATE 2 GM/50ML IV SOLN
2.0000 g | Freq: Once | INTRAVENOUS | Status: AC
  Administered 2018-11-25: 2 g via INTRAVENOUS
  Filled 2018-11-25: qty 50

## 2018-11-25 NOTE — Progress Notes (Signed)
[facility] Infusion Coordinator will follow pt with AHF team to support home infusion pharmacy services for home inotropes if needed at DC.  If patient discharges after hours, please call [phone].   Larry Sierras 11/25/2018, 3:33 PM

## 2018-11-25 NOTE — Progress Notes (Signed)
LVAD Coordinator Rounding Note:  Admitted 10/22/18 from ED with Failure to thrive and Low Flows.  HM II Implanted 10/19/13 by Dr. Cyndia Bent under Destination Therapy criteria.  Pt lying in bed this am. States that he feels a little bit better today.   Vital signs: Temp:  97.8 HR: 75 paced Doppler Pressure: 84 Automatic cuff:  Not done O2 Sat: 96%on RA  Weight:  186.7>184.9  lbs  LVAD interrogation reveals:  Speed: 8400  Flow:3.1 Power: 3.8w PI: 5.6  Alarms: pt had 90+ low flows between 5p - 11p last night. Around 11p pt went into a paced rhythm and low flows subsided. No more low flows since 11p last night Events:rare  Fixed speed: 8400 Low speed limit: 8000  Drive Line: Driveline dressing C/D/I. Weekly dressings performed by bedside nurse or SO [name]. Next dressing change due 11/28/18.   Labs:  LDH trend: 376>278  INR trend: 3.15>3.06  Hgb trend:  10.3>8.3  Coox: 57%   CVP: 16  Blood products this admission: -11/25/18-1 u/PRBC   Anticoagulation Plan: - INR Goal: 2.0 - 2.5 (coumadin on hold due to current GI bleed) - ASA Dose: no ASA (hx of GI bleed)   Device: - Dual Medtronic ICD - VF therapies: on at 200 bpm - AT/AF monitor on: 140 bpm  - Last device check 09/30/18 per Tomi Bamberger; underlying rhythm Wenckeback  Significant Events on VAD Support:  09/01/14>>ureteral stone with cystoscopy and flexible ureteroscopy with lithotripsy and basket extraction 02/2015 >>GIB with 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 Started on Plavix 3/17>>Lumbar fusion L3-L5 9/17>>VT with DC-CV; amio increased 10/17>>ramp with RV failure; speed decreased to 8800 02/2017> GIB (removed ASA, scopes negative) 03/2017>percutaneous lead repair after pump stops  10/18>>Slow VT. Cardioverted in ED and amiodarone  uptitrated 12/18>>melena with scope. Hgb 6.5. EGD with 2 AVMs with APC 12/18>>recurrent hematuria. 6 mm left ureteral stone 1/19>>7 mm left ureteral stone removal with laser lithotripsy; left ureteral stent. Resection of 6 cm bladder tumor 11/19>>Bladder tumor resections. Gross hematuria requiring CBI and radiation treatments.  11/24/18>>Initiation of Dobutamine  Plan/Recommendations:  1. Please call VAD pager if any issues with VAD equipment or questions, concerns. 2. Weekly dressing changes, next dressing change due 11/28/18. Bedside nurse may change dressing.    Tanda Rockers RN [place] Coordinator  Office: [phone]  24/7 Pager: [phone]

## 2018-11-25 NOTE — Progress Notes (Signed)
[place] for Coumadin Indication: LVAD  Allergies  Allergen Reactions  . Demerol [Meperidine] Other (See Comments)    Paralysis/ Could only move eyes  . Coconut Oil Hives    Patient Measurements: Height: 6' (182.9 cm) Weight: 184 lb 15.5 oz (83.9 kg) IBW/kg (Calculated) : 77.6 Heparin Dosing Weight: 84 kg  Vital Signs: Temp: 97.8 F (36.6 C) (01/21 0421) Temp Source: Oral (01/21 0421)  Labs: Recent Labs    11/24/18 0108 11/24/18 0607 11/25/18 0438  HGB 10.2* 10.3* 8.3*  HCT 33.8* 33.4* 27.6*  PLT 173 160 121*  LABPROT 31.9*  --  31.2*  INR 3.15  --  3.06  CREATININE 1.81* 1.76* 1.51*    Estimated Creatinine Clearance: 43.5 mL/min (A) (by C-G formula based on SCr of 1.51 mg/dL (H)).   Medical History: Past Medical History:  Diagnosis Date  . Arthritis   . Automatic implantable cardioverter-defibrillator in situ    a. s/p Medtronic Evera device serial [id number]    . Bradycardia   . CAD (coronary artery disease)    a. s/p MI 1982;  s/p DES to CFX 2011;  s/p NSTEMI 4/12 in setting of VTach;  cath 7/12:  LM ok, LAD mid 30-40%, Dx's with 40%, mCFX stent patent, prox to mid RCA occluded with R to R and L to R collats.  Medical Tx was continued  . Chronic systolic heart failure ([place])    a. s/p LVAD 10/2013 for destination therapy  . CKD (chronic kidney disease)   . CVD (cerebrovascular disease)    a. s/p L CEA 2008  . Cytopenia   . Dysrhythmia    AFIB  . History of GI bleed    a. on ASA- started on plavix during 10/2015 admission for CVA  . HLD (hyperlipidemia)   . HTN (hypertension)   . Hypothyroidism   . Inappropriate therapy from implantable cardioverter-defibrillator    a. ATP for sinus tach SVT wavelet identified SVT but was no  passive (2014)  . Ischemic cardiomyopathy   . Melanoma of ear ([place])    a. L Ear   . Myocardial infarction ([place])    1992  . PAF (paroxysmal atrial fibrillation) ([place])   . PVD  (peripheral vascular disease) ([place])    a. 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 Shortness of breath dyspnea    W/ EXERTION   . Sleep apnea    NO CPAP NOW  HE SENT IT BACK YRS AGO  . Stroke [facility])    a. 10/2015 admission   . Ventricular tachycardia ([place])    a. s/p AICD;   h/o ICD shock;  Amiodarone Rx. S/P ablation in 2012    Medications:  Scheduled:  . amiodarone  200 mg Oral BID  . atorvastatin  80 mg Oral QHS  . docusate sodium  100 mg Oral Daily  . dronabinol  2.5 mg Oral BID AC  . levothyroxine  150 mcg Oral QAC breakfast  . midodrine  10 mg Oral TID WC  . multivitamin with minerals  1 tablet Oral Daily  . pantoprazole  40 mg Oral BID AC  . ENSURE MAX PROTEIN  11 oz Oral Daily  . sodium chloride flush  10-40 mL Intracatheter Q12H  . sotalol  80 mg Oral BID    Assessment: 78 yo male  on chronic warfarin for LVAD with hx recurrent GIB and bladder tumor.  No hematuria or black stools currently.  INR at admit above goal range.  INR this morning is still high this morning at 3.0, hgb down 10.3>8.3, LDH stable 270s.   Most recent home dose: Warfarin 1 mg daily  Goal of Therapy:  INR 2-2.5 (keep closer to 2) Monitor platelets by anticoagulation protocol: Yes   Plan:  Continue to hold warfarin Rule out any bleeding Daily INR.  Erin Hearing PharmD., BCPS Clinical Pharmacist 11/25/2018 7:23 AM

## 2018-11-25 NOTE — Progress Notes (Addendum)
Advanced Heart Failure VAD Team Note  PCP-Cardiologist: Glori Bickers, MD   Subjective:   Admitted with failure to thrive and low flows.   PICC placed. CO-OX 40% initially. Started on dobutamine 2.5 mcg/kg/min. CO-OX is 57%.   Feeling better. Denies SOB. Denies hematuria and melena.   LVAD INTERROGATION:  HeartMate II LVAD:   Flow 2.7  liters/min, speed 8400, power 4, PI 6.3  Over 120 low flow alarms.   Objective:    Vital Signs:   Temp:  [97.8 F (36.6 C)-98.7 F (37.1 C)] 97.8 F (36.6 C) (01/21 0421) Pulse Rate:  [73-90] 73 (01/20 1900) Resp:  [14-19] 17 (01/20 2354) BP: (97-98)/(80-84) 97/80 (01/20 1900) SpO2:  [91 %-100 %] 96 % (01/21 0421) Weight:  [83.9 kg] 83.9 kg (01/21 0500) Last BM Date: 11/23/18  Mean arterial Pressure 80s  Intake/Output:   Intake/Output Summary (Last 24 hours) at 11/25/2018 0812 Last data filed at 11/25/2018 0421 Gross per 24 hour  Intake 302.2 ml  Output 500 ml  Net -197.8 ml     Physical Exam   CVP 13-14  General:   No resp difficulty HEENT: normal Neck: supple. JVP 13-14 . Carotids 2+ bilat; no bruits. No lymphadenopathy or thyromegaly appreciated. Cor: Mechanical heart sounds with LVAD hum present. Lungs: clear Abdomen: soft, nontender, nondistended. No hepatosplenomegaly. No bruits or masses. Good bowel sounds. Driveline: C/D/I; securement device intact and driveline incorporated Extremities: no cyanosis, clubbing, rash, edema Neuro: alert & orientedx3, cranial nerves grossly intact. moves all 4 extremities w/o difficulty. Affect pleasant   Telemetry   NSR 70s   EKG    N/A  Labs   Basic Metabolic Panel: Recent Labs  Lab 11/18/18 0839 11/24/18 0108 11/24/18 0607 11/25/18 0438  NA 137 141 140 140  K 3.0* 4.2 4.1 3.9  CL 103 108 108 111  CO2 25 22 22 25   GLUCOSE 105* 108* 103* 96  BUN 19 23 23 20   CREATININE 1.45* 1.81* 1.76* 1.51*  CALCIUM 8.7* 8.8* 8.6* 8.4*  MG  --   --   --  1.8    Liver Function  Tests: Recent Labs  Lab 11/24/18 0108  AST 118*  ALT 91*  ALKPHOS 143*  BILITOT 1.0  PROT 6.5  ALBUMIN 3.6   No results for input(s): LIPASE, AMYLASE in the last 168 hours. No results for input(s): AMMONIA in the last 168 hours.  CBC: Recent Labs  Lab 11/18/18 0839 11/24/18 0108 11/24/18 0607 11/25/18 0438  WBC 5.3 6.5 6.0 5.1  NEUTROABS  --  4.4  --   --   HGB 10.3* 10.2* 10.3* 8.3*  HCT 32.1* 33.8* 33.4* 27.6*  MCV 104.6* 108.3* 106.0* 108.2*  PLT 202 173 160 121*    INR: Recent Labs  Lab 11/18/18 0839 11/24/18 0108 11/25/18 0438  INR 3.00 3.15 3.06    Other results:  EKG:    Imaging   Dg Chest Portable 1 View  Result Date: 11/24/2018 CLINICAL DATA:  [place] in bathroom with generalized weakness EXAM: PORTABLE CHEST 1 VIEW COMPARISON:  09/17/2018 FINDINGS: Post sternotomy changes. Partially visualized LVAD. Left-sided pacing device as before. Cardiomegaly with aortic atherosclerosis. Mild central congestion. Suspected small pleural effusions. No pneumothorax. IMPRESSION: Cardiomegaly with vascular congestion. Probable small pleural effusions. Mild diffuse coarse interstitial opacity, some of which is suspected to to be secondary to chronic interstitial change. Electronically Signed   By: Donavan Foil M.D.   On: 11/24/2018 01:38   [place] Ekg Site Rite  Result  Date: 11/24/2018 If Site Rite image not attached, placement could not be confirmed due to current cardiac rhythm.     Medications:     Scheduled Medications: . amiodarone  200 mg Oral BID  . atorvastatin  80 mg Oral QHS  . docusate sodium  100 mg Oral Daily  . dronabinol  2.5 mg Oral BID AC  . levothyroxine  150 mcg Oral QAC breakfast  . midodrine  10 mg Oral TID WC  . multivitamin with minerals  1 tablet Oral Daily  . pantoprazole  40 mg Oral BID AC  . ENSURE MAX PROTEIN  11 oz Oral Daily  . sodium chloride flush  10-40 mL Intracatheter Q12H  . sotalol  80 mg Oral BID     Infusions: .  DOBUTamine 2.5 mcg/kg/min (11/25/18 0421)  . magnesium sulfate 1 - 4 g bolus IVPB       PRN Medications:  acetaminophen, ondansetron (ZOFRAN) IV, promethazine, sodium chloride flush, temazepam   Patient Profile   [name] is a 78 y.o. male  with h/o VT, PAD and severe systolic HF (EF 56-38%) due to mixed ischemic/nonischemic CM he is s/p HM II LVAD implant for destination therapy on October 19, 2013.VAD implantation was complicated by VT, syncope, bladder cancer, AF/Aflutter and RHF  Assessment/Plan:   1. 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  CO-OX on admit 40%. Started on dobutamine. Continue 2.5 mcg dobutamine.   Suspect he will need trial of dobutamine - TSH 7.   2.h/o recentVTwith hypotension and low flow alarms on VAD: Underwent DC-CV 9/17, 08/17/17, and 04/01/18. S/p emergent DC-CV in ER 07/09/18. S/p DC-CV in ER on 1/3 - Had recurrent VT on 1/12 at rate ~200 bpm. Failed ATP which sped up VT to ~300 bpm and then was shocked x 1 - Device reprogrammed recently  to avoid shocks at home. VT zone 118-230 monitor only. No ATP. > 230 bpm then shock - In NSR today. - Continue amio and sotalol for now.  - Will d/w EP - Keep K > 4.0 Mg > 2.0  3. Acute on chronic systolic HF s/p HM-II LVAD 10/2014. s/p previous driveline [id number]. No further pump stops. [place] in 1/19 with mildly low output. Started on milrinone without benefitso it was stopped.  - We have started florinef and have been pushing fluids to avoid low flows.  - CO-OX low. Dobutamine 2.5 mcg started. Todays CO-OX improved to 57%. CVP 13-14. He had over 120 low flows on VAD interrogation. No low flow since 2300.  Hold off on diuretic for now.   4. Recurrent high-grade bladder tumor:  - S/p stone removal. 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 Had 2 BCG infusions with Dr. Gloriann Loan but discontinued due to inability to tolerate.  - S/p repeat resection 7/19.  - s/p repeat bladder tumor excision in 11/19 with fast-growing tumor. C/b bilateral hydronephrosis and persistent hematuria requiring XRT. - Undergoing palliative XRT with Dr. Tammi Klippel.  -XRT on hold.  Denies hematuria.   5. CKD, stage III: Had AKI in setting of obstructive uropathy and ATN. Required HD x1(11/2017).  - Cr pending today. Baseline ~ 1.5 -Not interested in nephrostomy tubes for hydronephrosis - Creatinine  Stable today.   5. LVAD: - VAD interrogated personally. Parameters stable. - VAD speed turned down to 8400 previously to help with low flows - INR 3.15 Try to keep INR closer to 2.0.  Discussed dosing with PharmD personally. -[id number]   6. 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 Continue coumadin and plavix.(with h/o CVA). Admitted 4/18 with recurrent GIB. Transfused 2u. EGD/colon negative. Admitted 12./18. hgb 6.5, Transfused 5u. EGD with 2 AVMs treated with APC.  - 10/24/18 EGD showedone non-bleeding AVM with APC.  - 10/25/18 Capsule with 2 clear AVMs noted, one proximal duodenum and the other suspected jejunum. Refuses Octreotide due to cost.  - Hgb trending down from 10.2> 8.3.  No visible source of bleeding. INR supratherapeutic. Give 1UPRBCs.    7. Atrial arrhythmias: Atrial fibrillation, atrial tachycardia. s/p multiple DC-CVs last 07/02/18. 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  - Continueamiodarone and sotalol.   8. HTN: - MAPS stable despite low flows.   9. H/o amio-induced hypethyroidism: Followed by Dr. Forde Dandy in past.  - Amio restarted due to recurrent atrial arrhythmias and VT. Check LFTs. TSH 7.   May need home dobutamine. He has PICC line in place. Consult PT.   I reviewed the LVAD parameters from today, and compared the results to the patient's prior recorded data.  No programming changes were made.  The LVAD is functioning within specified parameters.  The patient performs LVAD self-test daily.  LVAD  interrogation was negative for any significant power changes, alarms or PI events/speed drops.  LVAD equipment check completed and is in good working order.  Back-up equipment present.   LVAD education done on emergency procedures and precautions and reviewed exit site care.  Length of Stay: 1  Darrick Grinder, NP 11/25/2018, 8:12 AM  VAD Team --- VAD ISSUES ONLY--- Pager [phone] (7am - 7am)  Advanced Heart Failure Team  Pager [phone] (M-F; 7a - 4p)  Please contact [facility] for night-coverage after hours (4p -7a ) and weekends on [email or web address]  Patient seen with NP, agree with the above note.   Today, he seems to be doing better.  He is a-pacing at 23.  No low flows since 11 pm last night, now on dobutamine.  Prior to dobutamine, innumerable low flow events.  CVP 13-14.  He feels better on dobutamine gtt.  Creatinine better at 1.5.   - Continue dobutamine gtt, suspect will go home on dobutamine given intractable RV failure.   LVAD parameters stable with on low flow events today.   Hgb has trended down to 8.3.  I will give him 1 unit PRBCs today.    No VT, continue sotalol and amiodarone.   Loralie Champagne 11/25/2018 2:14 PM

## 2018-11-26 ENCOUNTER — Telehealth: Payer: Self-pay

## 2018-11-26 ENCOUNTER — Ambulatory Visit: Payer: Medicare Other

## 2018-11-26 LAB — BPAM RBC
Blood Product Expiration Date: 202002222359
ISSUE DATE / TIME: 202001211217
Unit Type and Rh: 5100

## 2018-11-26 LAB — PROTIME-INR
INR: 2.87
Prothrombin Time: 29.7 seconds — ABNORMAL HIGH (ref 11.4–15.2)

## 2018-11-26 LAB — TYPE AND SCREEN
ABO/RH(D): O POS
Antibody Screen: NEGATIVE
Unit division: 0

## 2018-11-26 LAB — COOXEMETRY PANEL
Carboxyhemoglobin: 1.8 % — ABNORMAL HIGH (ref 0.5–1.5)
Methemoglobin: 1.8 % — ABNORMAL HIGH (ref 0.0–1.5)
O2 Saturation: 56.6 %
Total hemoglobin: 10.3 g/dL — ABNORMAL LOW (ref 12.0–16.0)

## 2018-11-26 LAB — BASIC METABOLIC PANEL
Anion gap: 6 (ref 5–15)
BUN: 17 mg/dL (ref 8–23)
CHLORIDE: 111 mmol/L (ref 98–111)
CO2: 23 mmol/L (ref 22–32)
CREATININE: 1.42 mg/dL — AB (ref 0.61–1.24)
Calcium: 8.4 mg/dL — ABNORMAL LOW (ref 8.9–10.3)
GFR calc Af Amer: 54 mL/min — ABNORMAL LOW (ref >60)
GFR calc non Af Amer: 47 mL/min — ABNORMAL LOW (ref >60)
Glucose, Bld: 102 mg/dL — ABNORMAL HIGH (ref 70–99)
Potassium: 4.5 mmol/L (ref 3.5–5.1)
Sodium: 140 mmol/L (ref 135–145)

## 2018-11-26 LAB — CBC
HCT: 30.6 % — ABNORMAL LOW (ref 39.0–52.0)
Hemoglobin: 9.1 g/dL — ABNORMAL LOW (ref 13.0–17.0)
MCH: 32.2 pg (ref 26.0–34.0)
MCHC: 29.7 g/dL — ABNORMAL LOW (ref 30.0–36.0)
MCV: 108.1 fL — ABNORMAL HIGH (ref 80.0–100.0)
Platelets: 114 10*3/uL — ABNORMAL LOW (ref 150–400)
RBC: 2.83 MIL/uL — ABNORMAL LOW (ref 4.22–5.81)
RDW: 22.5 % — ABNORMAL HIGH (ref 11.5–15.5)
WBC: 4.8 10*3/uL (ref 4.0–10.5)
nRBC: 0 % (ref 0.0–0.2)

## 2018-11-26 LAB — URINE CULTURE: Culture: NO GROWTH

## 2018-11-26 LAB — T3, FREE: T3, Free: 1 pg/mL — ABNORMAL LOW (ref 2.0–4.4)

## 2018-11-26 LAB — LACTATE DEHYDROGENASE: LDH: 250 U/L — ABNORMAL HIGH (ref 98–192)

## 2018-11-26 MED ORDER — MAGNESIUM SULFATE 2 GM/50ML IV SOLN
2.0000 g | Freq: Once | INTRAVENOUS | Status: AC
  Administered 2018-11-26: 2 g via INTRAVENOUS
  Filled 2018-11-26: qty 50

## 2018-11-26 MED ORDER — WARFARIN - PHARMACIST DOSING INPATIENT
Freq: Every day | Status: DC
  Administered 2018-11-27: 17:00:00

## 2018-11-26 MED ORDER — FUROSEMIDE 10 MG/ML IJ SOLN
40.0000 mg | Freq: Once | INTRAMUSCULAR | Status: AC
  Administered 2018-11-26: 40 mg via INTRAVENOUS
  Filled 2018-11-26: qty 4

## 2018-11-26 MED ORDER — WARFARIN 0.5 MG HALF TABLET
0.5000 mg | ORAL_TABLET | Freq: Once | ORAL | Status: AC
  Administered 2018-11-26: 0.5 mg via ORAL
  Filled 2018-11-26: qty 1

## 2018-11-26 MED ORDER — FUROSEMIDE 10 MG/ML IJ SOLN
40.0000 mg | Freq: Once | INTRAMUSCULAR | Status: AC
  Administered 2018-11-26: 40 mg via INTRAVENOUS

## 2018-11-26 NOTE — Telephone Encounter (Signed)
Per 1/17 no los

## 2018-11-26 NOTE — Progress Notes (Signed)
[place] for Coumadin Indication: LVAD  Allergies  Allergen Reactions  . Demerol [Meperidine] Other (See Comments)    Paralysis/ Could only move eyes  . Coconut Oil Hives    Patient Measurements: Height: 6' (182.9 cm) Weight: 183 lb 13.8 oz (83.4 kg) IBW/kg (Calculated) : 77.6 Heparin Dosing Weight: 84 kg  Vital Signs: Temp: 97.3 F (36.3 C) (01/22 0724) Temp Source: Oral (01/22 0724) BP: 88/70 (01/22 0724) Pulse Rate: 77 (01/22 0345)  Labs: Recent Labs    11/24/18 0108 11/24/18 0607 11/25/18 0438 11/26/18 0340  HGB 10.2* 10.3* 8.3* 9.1*  HCT 33.8* 33.4* 27.6* 30.6*  PLT 173 160 121* 114*  LABPROT 31.9*  --  31.2* 29.7*  INR 3.15  --  3.06 2.87  CREATININE 1.81* 1.76* 1.51* 1.42*    Estimated Creatinine Clearance: 46.3 mL/min (A) (by C-G formula based on SCr of 1.42 mg/dL (H)).   Medical History: Past Medical History:  Diagnosis Date  . Arthritis   . Automatic implantable cardioverter-defibrillator in situ    a. s/p Medtronic Evera device serial [id number]    . Bradycardia   . CAD (coronary artery disease)    a. s/p MI 1982;  s/p DES to CFX 2011;  s/p NSTEMI 4/12 in setting of VTach;  cath 7/12:  LM ok, LAD mid 30-40%, Dx's with 40%, mCFX stent patent, prox to mid RCA occluded with R to R and L to R collats.  Medical Tx was continued  . Chronic systolic heart failure ([place])    a. s/p LVAD 10/2013 for destination therapy  . CKD (chronic kidney disease)   . CVD (cerebrovascular disease)    a. s/p L CEA 2008  . Cytopenia   . Dysrhythmia    AFIB  . History of GI bleed    a. on ASA- started on plavix during 10/2015 admission for CVA  . HLD (hyperlipidemia)   . HTN (hypertension)   . Hypothyroidism   . Inappropriate therapy from implantable cardioverter-defibrillator    a. ATP for sinus tach SVT wavelet identified SVT but was no  passive (2014)  . Ischemic cardiomyopathy   . 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 L Ear   .  Myocardial infarction ([place])    1992  . PAF (paroxysmal atrial fibrillation) ([place])   . PVD (peripheral vascular disease) ([place])    a. 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 Shortness of breath dyspnea    W/ EXERTION   . Sleep apnea    NO CPAP NOW  HE SENT IT BACK YRS AGO  . Stroke [facility])    a. 10/2015 admission   . Ventricular tachycardia ([place])    a. s/p AICD;   h/o ICD shock;  Amiodarone Rx. S/P ablation in 2012    Assessment: 78 yo male on chronic warfarin for LVAD with hx recurrent GIB and bladder tumor.  No hematuria or black stools currently.  INR at admit above goal range >3.  INR this morning is still above goal at 2.8 but trending down, hgb up after transfusion yesterday to 9.1. No bleeding or hematuria noted. No low flow alarms, LDH down to 250. Will give very small dose of warfarin tonight so that we do not see a major drop in his INR this admit.   Most recent home dose:  Warfarin 1 mg daily  Goal of Therapy:  INR 2-2.5 (keep closer to 2) Monitor platelets by anticoagulation protocol: Yes   Plan:  Warfarin 0.5mg  tonight INR daily  Erin Hearing PharmD., BCPS Clinical Pharmacist 11/26/2018 10:18 AM

## 2018-11-26 NOTE — Progress Notes (Addendum)
Advanced Heart Failure VAD Team Note  PCP-Cardiologist: Glori Bickers, MD   Subjective:   Admitted with failure to thrive and low flows.   Remains on dobutamine 2.5 mcg. CO-OX 58% Feeling a bit better but still weak. Diuresing with IV lasix.   LVAD INTERROGATION:  HeartMate II LVAD:   Flow 2.9   liters/min, speed 8400, power 4, PI 6.6  . No low flows alarms since 11/24/17  Objective:    Vital Signs:   Temp:  [97.3 F (36.3 C)-98.4 F (36.9 C)] 97.3 F (36.3 C) (01/22 0724) Pulse Rate:  [69-77] 77 (01/22 0345) Resp:  [10-21] 10 (01/22 0724) BP: (77-110)/(64-91) 88/70 (01/22 0724) SpO2:  [94 %-98 %] 94 % (01/22 0345) Weight:  [83.4 kg] 83.4 kg (01/22 0502) Last BM Date: 11/24/18  Mean arterial Pressure 80-90s   Intake/Output:   Intake/Output Summary (Last 24 hours) at 11/26/2018 0951 Last data filed at 11/26/2018 0900 Gross per 24 hour  Intake 1142.02 ml  Output 450 ml  Net 692.02 ml     Physical Exam   CVP 17-18  Physical Exam: GENERAL: In bed  HEENT: normal  NECK: Supple, JVP to jaw  .  2+ bilaterally, no bruits.  No lymphadenopathy or thyromegaly appreciated.   CARDIAC:  Mechanical heart sounds with LVAD hum present.  LUNGS:  Clear to auscultation bilaterally.  ABDOMEN:  Soft, round, nontender, positive bowel sounds x4.     LVAD exit site: well-healed and incorporated.  Dressing dry and intact.  No erythema or drainage.  Stabilization device present and accurately applied.  Driveline dressing is being changed daily per sterile technique. EXTREMITIES:  Warm and dry, no cyanosis, clubbing, rash or edema. RUE PICC NEUROLOGIC:  Alert and oriented x 4.    No aphasia.  No dysarthria.  Affect pleasant.       Telemetry   NSR 70s   EKG    N/A  Labs   Basic Metabolic Panel: Recent Labs  Lab 11/24/18 0108 11/24/18 0607 11/25/18 0438 11/26/18 0340  NA 141 140 140 140  K 4.2 4.1 3.9 4.5  CL 108 108 111 111  CO2 22 22 25 23   GLUCOSE 108* 103* 96 102*    BUN 23 23 20 17   CREATININE 1.81* 1.76* 1.51* 1.42*  CALCIUM 8.8* 8.6* 8.4* 8.4*  MG  --   --  1.8  --     Liver Function Tests: Recent Labs  Lab 11/24/18 0108  AST 118*  ALT 91*  ALKPHOS 143*  BILITOT 1.0  PROT 6.5  ALBUMIN 3.6   No results for input(s): LIPASE, AMYLASE in the last 168 hours. No results for input(s): AMMONIA in the last 168 hours.  CBC: Recent Labs  Lab 11/24/18 0108 11/24/18 0607 11/25/18 0438 11/26/18 0340  WBC 6.5 6.0 5.1 4.8  NEUTROABS 4.4  --   --   --   HGB 10.2* 10.3* 8.3* 9.1*  HCT 33.8* 33.4* 27.6* 30.6*  MCV 108.3* 106.0* 108.2* 108.1*  PLT 173 160 121* 114*    INR: Recent Labs  Lab 11/24/18 0108 11/25/18 0438 11/26/18 0340  INR 3.15 3.06 2.87    Other results:  EKG:    Imaging   No results found.   Medications:     Scheduled Medications: . amiodarone  200 mg Oral BID  . atorvastatin  80 mg Oral QHS  . docusate sodium  100 mg Oral Daily  . dronabinol  2.5 mg Oral BID AC  . levothyroxine  150  mcg Oral QAC breakfast  . midodrine  10 mg Oral TID WC  . multivitamin with minerals  1 tablet Oral Daily  . pantoprazole  40 mg Oral BID AC  . ENSURE MAX PROTEIN  11 oz Oral Daily  . sodium chloride flush  10-40 mL Intracatheter Q12H  . sotalol  80 mg Oral BID    Infusions: . DOBUTamine 2.5 mcg/kg/min (11/26/18 0800)    PRN Medications: acetaminophen, ondansetron (ZOFRAN) IV, promethazine, sodium chloride flush, temazepam   Patient Profile   [name] is a 78 y.o. male  with h/o VT, PAD and severe systolic HF (EF 40-98%) due to mixed ischemic/nonischemic CM he is s/p HM II LVAD implant for destination therapy on October 19, 2013.VAD implantation was complicated by VT, syncope, bladder cancer, AF/Aflutter and RHF  Assessment/Plan:   1. 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  CO-OX on admit 40%. Started on dobutamine. Continue 2.5 mcg dobutamine.    Suspect he will need trial of dobutamine - TSH 7.   2.h/o recentVTwith hypotension and low flow alarms on VAD: Underwent DC-CV 9/17, 08/17/17, and 04/01/18. S/p emergent DC-CV in ER 07/09/18. S/p DC-CV in ER on 1/3 - Had recurrent VT on 1/12 at rate ~200 bpm. Failed ATP which sped up VT to ~300 bpm and then was shocked x 1 - Device reprogrammed recently  to avoid shocks at home. VT zone 118-230 monitor only. No ATP. > 230 bpm then shock - In NSR today. - Continue amio and sotalol for now.  - Will d/w EP - Keep K > 4.0 Mg > 2.0 -Mag low. Give 2 grams mag now.   3. Acute on chronic systolic HF s/p HM-II LVAD 10/2014. s/p previous driveline [id number]. No further pump stops. [place] in 1/19 with mildly low output. Started on milrinone without benefitso it was stopped.  - We have started florinef and have been pushing fluids to avoid low flows.  - CO-OX low so Dobutamine 2.5 mcg started.  Todays CO-OX is 57.5%. Continue current dose.  CVp up to 17-18 . Give one dose of IV lasix 40 mg now. Assess for low flows.   4. Recurrent high-grade bladder tumor:  - S/p stone removal. 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 Had 2 BCG infusions with Dr. Gloriann Loan but discontinued due to inability to tolerate. - S/p repeat resection 7/19.  - s/p repeat bladder tumor excision in 11/19 with fast-growing tumor. C/b bilateral hydronephrosis and persistent hematuria requiring XRT. - Undergoing palliative XRT with Dr. Tammi Klippel.  -XRT on hold.  Denies hematuria.   5. CKD, stage III: Had AKI in setting of obstructive uropathy and ATN. Required HD x1(11/2017).  - Cr pending today. Baseline ~ 1.5 -Not interested in nephrostomy tubes for hydronephrosis -Stable renal funciton.    5. LVAD: - VAD interrogated personally. Parameters stable. - VAD speed turned down to 8400 previously to help with low flows - INR 2.8 . Would like to keep INR closer to 2.3 with bleeding history.  - LDH   278   6. 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 Continue coumadin and plavix.(with h/o CVA). Admitted 4/18 with recurrent GIB. Transfused 2u. EGD/colon negative. Admitted 12./18. hgb 6.5, Transfused 5u. EGD with 2 AVMs treated with APC.  - 10/24/18 EGD showedone non-bleeding AVM with APC.  - 10/25/18 Capsule with 2 clear AVMs noted, one proximal duodenum and the other  suspected jejunum. Refuses Octreotide due to cost.  - Received 1UPRBC 1/21. Hgb up from 8.3>9.1  No visible source of bleeding.   7. Atrial arrhythmias: Atrial fibrillation, atrial tachycardia. s/p multiple DC-CVs last 07/02/18. 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  - Continueamiodarone and sotalol.   8. HTN: - MAPS stable despite low flows.   9. H/o amio-induced hypethyroidism: Followed by Dr. Forde Dandy in past.  - Amio restarted due to recurrent atrial arrhythmias and VT. Check LFTs. TSH 7.   I will consult [facility] for home dobutamine. I have spoken to Carolynn Sayers today regarding home dobutamine. [facility] does not cover that area but they are talking to [facility]. [facility] much appreciated working on this. Possible d/c 24-48 hours.    I reviewed the LVAD parameters from today, and compared the results to the patient's prior recorded data.  No programming changes were made.  The LVAD is functioning within specified parameters.  The patient performs LVAD self-test daily.  LVAD interrogation was negative for any significant power changes, alarms or PI events/speed drops.  LVAD equipment check completed and is in good working order.  Back-up equipment present.   LVAD education done on emergency procedures and precautions and reviewed exit site care.  Length of Stay: 2  Darrick Grinder, NP 11/26/2018, 9:51 AM  VAD Team --- VAD ISSUES ONLY--- Pager [phone] (7am - 7am)  Advanced Heart Failure Team  Pager [phone] (M-F; 7a - 4p)  Please contact [facility] for night-coverage after hours (4p -7a ) and weekends on  [email or web address]  Patient seen and examined with the above-signed Advanced Practice Provider and/or Housestaff. I personally reviewed laboratory data, imaging studies and relevant notes. I independently examined the patient and formulated the important aspects of the plan. I have edited the note to reflect any of my changes or salient points. I have personally discussed the plan with the patient and/or family.  Now on dobutamine 2.5 for RV support. Feeling better but still weak. No dyspnea, orthopnea or PND. No bleeding. Diuresing on IV lasix but CVP still 12-13. Will continue IV lasix. VAD interrogated personally and Low flows have resolved. Home dobutamine discussed with [facility] infusion team personally. INR 1.9. Rhythm stable.   Glori Bickers, MD  3:30 PM

## 2018-11-26 NOTE — Evaluation (Signed)
Physical Therapy Evaluation Patient Details Name: [name] MRN: [id number] DOB: August 15, 1941 Today's Date: 11/26/2018   History of Present Illness  Pt adm with failure to thrive and low flows on LVAD. 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 Pt has been receiving palliative radiation for bladder CA that are currently on hold. PMH - LVAD 2014, bladder CA, chf, cad, CVA, arthritis  Clinical Impression  Pt presents to PT with decr mobility and activity tolerance compared to baseline. Recommend PT to maximize independence and safety prior to return home. Pt known to me from prior admit. Pt definitely with lower activity tolerance than I have seen in the past.     Follow Up Recommendations Home health PT(possibly)    Equipment Recommendations  None recommended by PT    Recommendations for Other Services       Precautions / Restrictions Precautions Precautions: Fall;Other (comment) Precaution Comments: LVAD Restrictions Weight Bearing Restrictions: No      Mobility  Bed Mobility Overal bed mobility: Modified Independent             General bed mobility comments: Incr time and use of rail and HOB elevated  Transfers Overall transfer level: Needs assistance Equipment used: 4-wheeled walker Transfers: Sit to/from Stand Sit to Stand: Min guard         General transfer comment: Assist for safety and lines. Pt with incr time to rise  Ambulation/Gait Ambulation/Gait assistance: Min guard Gait Distance (Feet): 150 Feet(150' x 1, 100' x 1) Assistive device: 4-wheeled walker Gait Pattern/deviations: Step-through pattern;Decreased step length - right;Decreased step length - left;Trunk flexed Gait velocity: decr Gait velocity interpretation: 1.31 - 2.62 ft/sec, indicative of limited community ambulator General Gait Details: assist for safety and lines. Pt with 1 sitting rest break on rollator  Stairs            Wheelchair Mobility    Modified Rankin (Stroke  Patients Only)       Balance Overall balance assessment: Needs assistance Sitting-balance support: No upper extremity supported;Feet supported Sitting balance-Leahy Scale: Good     Standing balance support: Single extremity supported Standing balance-Leahy Scale: Poor Standing balance comment: UE support                             Pertinent Vitals/Pain Pain Assessment: No/denies pain    Home Living Family/patient expects to be discharged to:: Private residence Living Arrangements: Spouse/significant other Available Help at Discharge: Family;Available PRN/intermittently Type of Home: House Home Access: Ramped entrance     Home Layout: Two level;Able to live on main level with bedroom/bathroom Home Equipment: Gilford Rile - 2 wheels;Walker - 4 wheels;Cane - single point;Bedside commode;Grab bars - toilet;Grab bars - tub/shower;Tub bench;Toilet riser      Prior Function Level of Independence: Independent with assistive device(s)         Comments: Pt using rollator. Fall on 1/19 at home     Hand Dominance   Dominant Hand: Right    Extremity/Trunk Assessment   Upper Extremity Assessment Upper Extremity Assessment: Generalized weakness    Lower Extremity Assessment Lower Extremity Assessment: Generalized weakness       Communication   Communication: No difficulties  Cognition Arousal/Alertness: Awake/alert Behavior During Therapy: WFL for tasks assessed/performed Overall Cognitive Status: Within Functional Limits for tasks assessed  General Comments      Exercises     Assessment/Plan    PT Assessment Patient needs continued PT services  PT Problem List Decreased strength;Decreased activity tolerance;Decreased balance;Decreased mobility       PT Treatment Interventions DME instruction;Gait training;Functional mobility training;Therapeutic activities;Therapeutic exercise;Balance  training;Patient/family education    PT Goals (Current goals can be found in the Care Plan section)  Acute Rehab PT Goals Patient Stated Goal: get strong enough to resume radiation PT Goal Formulation: With patient Time For Goal Achievement: 12/10/18 Potential to Achieve Goals: Good    Frequency Min 3X/week   Barriers to discharge        Co-evaluation               AM-PAC PT "6 Clicks" Mobility  Outcome Measure Help needed turning from your back to your side while in a flat bed without using bedrails?: None Help needed moving from lying on your back to sitting on the side of a flat bed without using bedrails?: None Help needed moving to and from a bed to a chair (including a wheelchair)?: A Little Help needed standing up from a chair using your arms (e.g., wheelchair or bedside chair)?: A Little Help needed to walk in hospital room?: A Little Help needed climbing 3-5 steps with a railing? : A Little 6 Click Score: 20    End of Session   Activity Tolerance: Patient limited by fatigue Patient left: in chair;with call bell/phone within reach Nurse Communication: Mobility status PT Visit Diagnosis: Other abnormalities of gait and mobility (R26.89);Muscle weakness (generalized) (M62.81);History of falling (Z91.81)    Time: 1115-1150 PT Time Calculation (min) (ACUTE ONLY): 35 min   Charges:   PT Evaluation $PT Eval Moderate Complexity: 1 Mod PT Treatments $Gait Training: 8-22 mins        [place] Pager [phone] Office [place] 11/26/2018, 12:16 PM

## 2018-11-26 NOTE — Progress Notes (Signed)
LVAD Coordinator Rounding Note:  Admitted 10/22/18 from ED with Failure to thrive and Low Flows.  HM II Implanted 10/19/13 by Dr. Cyndia Bent under Destination Therapy criteria.  Pt lying in bed this am. States that he feels a little bit better today, denies any VAD alarms. Has been sitting on side of bed to eat meals, Dr. Aundra Dubin wants him up and walking today. PT order placed to assist with ambulation.   Vital signs: Temp:  97.4 HR: 76 paced Doppler Pressure: 98 Automatic cuff:  88/70 (77) O2 Sat: 94%on RA  Weight:  186.7>184.9>183.8  lbs  LVAD interrogation reveals:  Speed: 8400  Flow:3.0 Power: 3.8w PI: 6.4  Alarms: none over 11/25/18 Events: rare  Fixed speed: 8400 Low speed limit: 8000  Drive Line: Driveline dressing C/D/I. Weekly dressings performed by bedside nurse or SO [name]. Next dressing change due 11/28/18.   Labs:  LDH trend: 376>278>250  INR trend: 3.15>3.06>2.87  Hgb trend:  10.3>8.3>9.1   Coox: 56.6 %   CVP: 16 - 17  Blood products this admission: -11/25/18-1 u/PRBC   Anticoagulation Plan: - INR Goal: 2.0 - 2.5 (coumadin on hold due to current GI bleed) - ASA Dose: no ASA (hx of GI bleed)   Device: - Dual Medtronic ICD - VF therapies: on at 200 bpm - AT/AF monitor on: 140 bpm  - Last device check 09/30/18 per Tomi Bamberger; underlying rhythm [place]ke[place]  Significant Events on VAD Support:  09/01/14>>ureteral stone with cystoscopy and flexible ureteroscopy with lithotripsy and basket extraction 02/2015 >>GIB with 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 Started on Plavix 3/17>>Lumbar fusion L3-L5 9/17>>VT with DC-CV; amio increased 10/17>>ramp with RV failure; speed decreased to 8800 02/2017> GIB (removed ASA, scopes negative) 03/2017>percutaneous lead repair after pump stops  10/18>>Slow VT.  Cardioverted in ED and amiodarone uptitrated 12/18>>melena with scope. Hgb 6.5. EGD with 2 AVMs with APC 12/18>>recurrent hematuria. 6 mm left ureteral stone 1/19>>7 mm left ureteral stone removal with laser lithotripsy; left ureteral stent. Resection of 6 cm bladder tumor 11/19>>Bladder tumor resections. Gross hematuria requiring CBI and radiation treatments.  11/24/18>>Initiation of Dobutamine  Plan/Recommendations:  1. Please call VAD pager if any issues with VAD equipment or questions, concerns. 2. Weekly dressing changes, next dressing change due 11/28/18. Bedside nurse may change dressing.    Zada Girt RN [place] Coordinator  Office: [phone]  24/7 Pager: [phone]

## 2018-11-27 ENCOUNTER — Ambulatory Visit: Payer: Medicare Other

## 2018-11-27 LAB — CBC
HCT: 28.7 % — ABNORMAL LOW (ref 39.0–52.0)
Hemoglobin: 9.2 g/dL — ABNORMAL LOW (ref 13.0–17.0)
MCH: 34.1 pg — ABNORMAL HIGH (ref 26.0–34.0)
MCHC: 32.1 g/dL (ref 30.0–36.0)
MCV: 106.3 fL — ABNORMAL HIGH (ref 80.0–100.0)
Platelets: 115 10*3/uL — ABNORMAL LOW (ref 150–400)
RBC: 2.7 MIL/uL — ABNORMAL LOW (ref 4.22–5.81)
RDW: 22 % — ABNORMAL HIGH (ref 11.5–15.5)
WBC: 4.9 10*3/uL (ref 4.0–10.5)
nRBC: 0 % (ref 0.0–0.2)

## 2018-11-27 LAB — COOXEMETRY PANEL
Carboxyhemoglobin: 2.3 % — ABNORMAL HIGH (ref 0.5–1.5)
Methemoglobin: 1 % (ref 0.0–1.5)
O2 Saturation: 52.1 %
Total hemoglobin: 11.5 g/dL — ABNORMAL LOW (ref 12.0–16.0)

## 2018-11-27 LAB — BASIC METABOLIC PANEL
Anion gap: 7 (ref 5–15)
BUN: 19 mg/dL (ref 8–23)
CALCIUM: 8.2 mg/dL — AB (ref 8.9–10.3)
CO2: 26 mmol/L (ref 22–32)
Chloride: 104 mmol/L (ref 98–111)
Creatinine, Ser: 1.4 mg/dL — ABNORMAL HIGH (ref 0.61–1.24)
GFR calc non Af Amer: 47 mL/min — ABNORMAL LOW (ref >60)
GFR, EST AFRICAN AMERICAN: 55 mL/min — AB (ref >60)
Glucose, Bld: 101 mg/dL — ABNORMAL HIGH (ref 70–99)
Potassium: 4.1 mmol/L (ref 3.5–5.1)
Sodium: 137 mmol/L (ref 135–145)

## 2018-11-27 LAB — PROTIME-INR
INR: 2.25
Prothrombin Time: 24.5 seconds — ABNORMAL HIGH (ref 11.4–15.2)

## 2018-11-27 LAB — MAGNESIUM: Magnesium: 2.3 mg/dL (ref 1.7–2.4)

## 2018-11-27 LAB — LACTATE DEHYDROGENASE: LDH: 263 U/L — ABNORMAL HIGH (ref 98–192)

## 2018-11-27 MED ORDER — DOBUTAMINE IN D5W 4-5 MG/ML-% IV SOLN: 2.5000 ug/kg/min | INTRAVENOUS | 12 refills | Status: AC

## 2018-11-27 MED ORDER — WARFARIN SODIUM 1 MG PO TABS
1.0000 mg | ORAL_TABLET | Freq: Once | ORAL | Status: AC
  Administered 2018-11-27: 1 mg via ORAL
  Filled 2018-11-27: qty 1

## 2018-11-27 NOTE — Progress Notes (Signed)
PT Cancellation Note  Patient Details Name: [name] MRN: [id number] DOB: 1940-11-26   Cancelled Treatment:    Reason Eval/Treat Not Completed: Other (comment). Pt plans to return home soon and defers further activity for the trip home.    Shary Decamp [facility] 11/27/2018, 3:25 PM Breta Demedeiros [place] Pager [phone] Office [phone]

## 2018-11-27 NOTE — Progress Notes (Addendum)
[place] for Coumadin Indication: LVAD  Allergies  Allergen Reactions  . Demerol [Meperidine] Other (See Comments)    Paralysis/ Could only move eyes  . Coconut Oil Hives    Patient Measurements: Height: 6' (182.9 cm) Weight: 177 lb 7.5 oz (80.5 kg) IBW/kg (Calculated) : 77.6 Heparin Dosing Weight: 84 kg  Vital Signs: Temp: 97.8 F (36.6 C) (01/23 0355) Temp Source: Oral (01/23 0355) BP: 80/65 (01/23 0355) Pulse Rate: 77 (01/23 0355)  Labs: Recent Labs    11/25/18 0438 11/26/18 0340 11/27/18 0358  HGB 8.3* 9.1* 9.2*  HCT 27.6* 30.6* 28.7*  PLT 121* 114* 115*  LABPROT 31.2* 29.7* 24.5*  INR 3.06 2.87 2.25  CREATININE 1.51* 1.42* 1.40*    Estimated Creatinine Clearance: 47 mL/min (A) (by C-G formula based on SCr of 1.4 mg/dL (H)).   Medical History: Past Medical History:  Diagnosis Date  . Arthritis   . Automatic implantable cardioverter-defibrillator in situ    a. s/p Medtronic Evera device serial [id number]    . Bradycardia   . CAD (coronary artery disease)    a. s/p MI 1982;  s/p DES to CFX 2011;  s/p NSTEMI 4/12 in setting of VTach;  cath 7/12:  LM ok, LAD mid 30-40%, Dx's with 40%, mCFX stent patent, prox to mid RCA occluded with R to R and L to R collats.  Medical Tx was continued  . Chronic systolic heart failure ([name])    a. s/p LVAD 10/2013 for destination therapy  . CKD (chronic kidney disease)   . CVD (cerebrovascular disease)    a. s/p L CEA 2008  . Cytopenia   . Dysrhythmia    AFIB  . History of GI bleed    a. on ASA- started on plavix during 10/2015 admission for CVA  . HLD (hyperlipidemia)   . HTN (hypertension)   . Hypothyroidism   . Inappropriate therapy from implantable cardioverter-defibrillator    a. ATP for sinus tach SVT wavelet identified SVT but was no  passive (2014)  . Ischemic cardiomyopathy   . 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 L Ear   . Myocardial infarction ([place])    1992  . PAF  (paroxysmal atrial fibrillation) ([place])   . PVD (peripheral vascular disease) ([place])    a. 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 Shortness of breath dyspnea    W/ EXERTION   . Sleep apnea    NO CPAP NOW  HE SENT IT BACK YRS AGO  . Stroke [facility])    a. 10/2015 admission   . Ventricular tachycardia ([place])    a. s/p AICD;   h/o ICD shock;  Amiodarone Rx. S/P ablation in 2012    Assessment: 78 yo male on chronic warfarin for LVAD with hx recurrent GIB and bladder tumor.  No hematuria or black stools currently.  INR at admit above goal range >3.  INR this morning is now down to within goal range at 2.2, hgb up after transfusion 1/21 to 9.2. No bleeding or hematuria noted. LDH stable 263.    Most recent home dose: Warfarin 1 mg daily  Goal of Therapy:  INR 2-2.5 (keep closer to 2) Monitor platelets by anticoagulation protocol: Yes   Plan:  If discharged today would resume 1mg  daily at discharge Would have INR rechecked  next week  Erin Hearing PharmD., BCPS Clinical Pharmacist 11/27/2018 7:33 AM

## 2018-11-27 NOTE — Care Management Important Message (Signed)
Important Message  Patient Details  Name: [name] MRN: [id number] Date of Birth: September 20, 1941   Medicare Important Message Given:  Yes    Lando Alcalde P [place] 11/27/2018, 11:07 AM

## 2018-11-27 NOTE — Care Management Note (Addendum)
Case Management Note  Patient Details  Name: [name] MRN: [id number] Date of Birth: [birth year]  Subjective/Objective:    Pt admitted with low flow alarms on LVAD               Action/Plan:   PTA independent from home.  Pt will discharge home on IV dobutamine - [facility] chosen for DME.  Pt will need HH for HF orders and RN - [place] secured for [facility] via [facility] - pt in agreement.   CM provided pt the medicare.gov HH list and informed of quality measures of agency   Expected Discharge Date:  11/27/18               Expected Discharge Plan:  [place]  In-House Referral:     Discharge planning Services  CM Consult  Post Acute Care Choice:    Choice offered to:  Patient  DME Arranged:  IV pump/equipment DME Agency:  [place] Arranged:  PT, RN [facility] Agency:  [place]  Status of Service:  Completed, signed off  If discussed at [place] of Stay Meetings, dates discussed:    Additional Comments: Update:   1649:   [facility] will accept pt with HF orders, RN, dobutamine infusion and PT.  Agency informed of LVAD and that [facility] will provide IV infusion.  Agency agreed to start care 11/28/18.  1550:  [facility] will accept pt for HHPT ONLY.  HF aware of barriers with [facility] and are in agreement for [facility] to service dobutamine during appts without a VZDG  3875:  Per HF team and [facility]; [facility] will perform dobutamine bag changes during HF clinic appts - HHRN no longer required.  CM also contacted [facility] and [facility] - agencies will follow up with CM  1500:  CM informed that [facility] can no longer accept pt.  CM/[facility] also contacted [facility], [place], [place], [place], [place] and [place] (per [email or web address] - neither agency can accept pt.  Denials were based on : complexity of pt at discharge (hx of LVAD, dobutamine infusion, pts insurance not in [place] and location of home.    [name], RN 11/27/2018, 4:49  PM

## 2018-11-27 NOTE — Progress Notes (Signed)
Pt left unit in wheelchair, accompanied by nurse and wife.

## 2018-11-27 NOTE — Discharge Summary (Addendum)
Advanced Heart Failure Team  Discharge Summary   Patient ID: [name] MRN: [id number], DOB/AGE: Oct 19, 1941 78 y.o. Admit date: 11/24/2018 D/C date:     11/27/2018   Primary Discharge Diagnoses:  1. Failure to thrive with multiple low flow alarms  2. A/C Systolic Heart Failure  3. Anemia  4. H/O VT 5. Recurrent high grade bladder tumor  6. CKD Stage III  7. Atrial Arrhythmias 8. HTN 9. H/O Amio induced hyperthyroidism    Hospital Course:  Frank Duddy McBrideis a 78 y.o.malewith h/o VT, PAD and severe systolic HF (EF 78-46%) due to mixed ischemic/nonischemic CM he is s/p HM II LVAD implant for destination therapy on October 19, 2013.VAD implantation was complicated by VT, syncope, bladder cancer, AF/Aflutter and RHF.   Admitted with failure to thrive and multiple low flow alarms on LVAD. PICC line was placed for suspected low output. Mixed venous saturation was low so dobutamine 2.5 mcg was started. Significant improvement with higher mixed venous saturation and resolution of low flow alarms. He will be discharged on dobutamine 2.5 mcg continuously.   Hemoglobin dropped down to 8.3 without any obvious source. He was given 1UPRBCs with appropriate rise. After his transfusion he was given IV lasix for volume overload. Plan to continue lasix as needed and may need more frequently with ongoing intrope support. He remains on coumadin and was therapeutic at the time of discharge. He is not on aspirin with history of GI bleed.   He will be followed by [facility] for home inotropes. [facility] will [facility] with [facility]. He will continue to be followed closely in the VAD clinic and has follow up next week.    HeartMate II LVAD:   Flow 2.9   liters/min, speed 8400, power 4, PI 6.6  . No low flows alarms since 11/24/18  Discharge Weight: 177 pounds  Discharge Vitals: Blood pressure 90/79, pulse 75, temperature 97.7 F (36.5 C), temperature source Oral, resp. rate 13, height 6' (1.829 m), weight  80.5 kg, SpO2 92 %.  Physical Exam: GENERAL: No acute distress. HEENT: normal  NECK: Supple, JVP  7-8.  2+ bilaterally, no bruits.  No lymphadenopathy or thyromegaly appreciated.   CARDIAC:  Mechanical heart sounds with LVAD hum present.  LUNGS:  Clear to auscultation bilaterally.  ABDOMEN:  Soft, round, nontender, positive bowel sounds x4.     LVAD exit site: well-healed and incorporated.  Dressing dry and intact.  No erythema or drainage.  Stabilization device present and accurately applied.  Driveline dressing is being changed daily per sterile technique. EXTREMITIES:  Warm and dry, no cyanosis, clubbing, rash or edema . RUE PICC  NEUROLOGIC:  Alert and oriented x 4.   No aphasia.  No dysarthria.  Affect pleasant.      Labs: Lab Results  Component Value Date   WBC 4.9 11/27/2018   HGB 9.2 (L) 11/27/2018   HCT 28.7 (L) 11/27/2018   MCV 106.3 (H) 11/27/2018   PLT 115 (L) 11/27/2018    Recent Labs  Lab 11/24/18 0108  11/27/18 0358  NA 141   < > 137  K 4.2   < > 4.1  CL 108   < > 104  CO2 22   < > 26  BUN 23   < > 19  CREATININE 1.81*   < > 1.40*  CALCIUM 8.8*   < > 8.2*  PROT 6.5  --   --   BILITOT 1.0  --   --   [name]  143*  --   --   ALT 91*  --   --   AST 118*  --   --   GLUCOSE 108*   < > 101*   < > = values in this interval not displayed.   Lab Results  Component Value Date   CHOL 106 11/05/2015   HDL 33 (L) 11/05/2015   LDLCALC 60 11/05/2015   TRIG 67 11/05/2015   BNP (last 3 results) No results for input(s): BNP in the last 8760 hours.  ProBNP (last 3 results) No results for input(s): PROBNP in the last 8760 hours.   Diagnostic Studies/Procedures   No results found.  Discharge Medications   Allergies as of 11/27/2018      Reactions   Demerol [meperidine] Other (See Comments)   Paralysis/ Could only move eyes   Coconut Oil Hives      Medication List    STOP taking these medications   dronabinol 2.5 MG capsule Commonly known as:  MARINOL    fludrocortisone 0.1 MG tablet Commonly known as:  FLORINEF     TAKE these medications   amiodarone 200 MG tablet Commonly known as:  PACERONE Take 1 tablet (200 mg total) by mouth 2 (two) times daily. What changed:  how much to take   atorvastatin 80 MG tablet Commonly known as:  LIPITOR Take 1 tablet (80 mg total) by mouth at bedtime.   DOBUTamine 4-5 MG/ML-% infusion Commonly known as:  DOBUTREX Inject 211.75 mcg/min into the vein continuous.   docusate sodium 100 MG capsule Commonly known as:  COLACE Take 100 mg by mouth daily.   furosemide 20 MG tablet Commonly known as:  LASIX Take 20 mg by mouth daily as needed for fluid.   levothyroxine 150 MCG tablet Commonly known as:  SYNTHROID, LEVOTHROID Take 1 tablet (150 mcg total) by mouth daily before breakfast.   midodrine 10 MG tablet Commonly known as:  PROAMATINE Take 1 tablet (10 mg total) by mouth 3 (three) times daily with meals.   multivitamin with minerals Tabs tablet Take 1 tablet by mouth daily.   pantoprazole 40 MG tablet Commonly known as:  PROTONIX TAKE 1 TABLET TWICE DAILY What changed:  when to take this   potassium chloride SA 20 MEQ tablet Commonly known as:  K-DUR,KLOR-CON Take 1 tablet (20 mEq total) by mouth daily.   protein supplement shake Liqd Commonly known as:  PREMIER PROTEIN Take 11 oz by mouth once.   sotalol 80 MG tablet Commonly known as:  BETAPACE Take 1 tablet (80 mg total) by mouth 2 (two) times daily.   spironolactone 25 MG tablet Commonly known as:  ALDACTONE Take 0.5 tablets (12.5 mg total) by mouth daily.   temazepam 30 MG capsule Commonly known as:  RESTORIL Take 1 capsule (30 mg total) by mouth at bedtime as needed for sleep.   warfarin 2 MG tablet Commonly known as:  COUMADIN Take as directed. If you are unsure how to take this medication, talk to your nurse or doctor. Original instructions:  Take 2 mg daily, further as directed by VAD clinic. What changed:     how much to take  how to take this  when to take this  additional instructions            Durable Medical Equipment  (From admission, onward)         Start     Ordered   11/27/18 0934  Heart failure home health orders  (Heart  failure home health orders / Face to face)  Once    Comments:  Heart Failure Follow-up Care:  Verify follow-up appointments per Patient Discharge Instructions. Confirm transportation arranged. Reconcile home medications with discharge medication list. Remove discontinued medications from use. Assist patient/caregiver to manage medications using pill box. Reinforce low sodium food selection Assessments: Vital signs and oxygen saturation at each visit. Assess home environment for safety concerns, caregiver support and availability of low-sodium foods. Consult Education officer, museum, PT/OT, Dietitian, and CNA based on assessments. Perform comprehensive cardiopulmonary assessment. Notify MD for any change in condition or weight gain of 3 pounds in one day or 5 pounds in one week with symptoms. Daily Weights and Symptom Monitoring: Ensure patient has access to scales. Teach patient/caregiver to weigh daily before breakfast and after voiding using same scale and record.    Teach patient/caregiver to track weight and symptoms and when to notify Provider. Activity: Develop individualized activity plan with patient/caregiver.   Home dobutamine. 2.5 mcg  [facility] to provide  Labs every other week to include BMET, Mg, and CBC with Diff. Additional as needed. Should be drawn via PERIPHERAL stick. NOT PICC line.   J1250 Dobutamine  2.5 mcg/kg/min X 52 weeks A4221 Supplies for maintenance of drug infusion catheter A4222 Supplies for the external drug infusion per cassette or bag E0781 Ambulatory Infusion pump  Question Answer Comment  Heart Failure Follow-up Care Advanced Heart Failure (AHF) Clinic at [phone]   Obtain the following labs Basic Metabolic Panel   Lab  frequency Weekly   Fax lab results to AHF Clinic at [phone]   Diet Low Sodium Heart Healthy   Fluid restrictions: 2000 mL Fluid      11/27/18 0934          Disposition   The patient will be discharged in stable condition to home. Discharge Instructions    (HEART FAILURE PATIENTS) Call MD:  Anytime you have any of the following symptoms: 1) 3 pound weight gain in 24 hours or 5 pounds in 1 week 2) shortness of breath, with or without a dry hacking cough 3) swelling in the hands, feet or stomach 4) if you have to sleep on extra pillows at night in order to breathe.   Complete by:  As directed    Diet - low sodium heart healthy   Complete by:  As directed    Heart Failure patients record your daily weight using the same scale at the same time of day   Complete by:  As directed    INR  Goal: 1.8 - 2.3   Complete by:  As directed    Goal:  1.8 - 2.3   Increase activity slowly   Complete by:  As directed    Page VAD Coordinator at [phone]  Notify for: any VAD alarms, sustained elevations of power >10 watts, sustained drop in Pulse Index <3   Complete by:  As directed    Notify for:   any VAD alarms sustained elevations of power >10 watts sustained drop in Pulse Index <3     Speed Settings:   Complete by:  As directed    Fixed 8400 RPM Low 7800 RPM     Follow-up Information    Bensimhon, Shaune Pascal, MD Follow up on 12/04/2018.   Specialty:  Cardiology Why:  at [place] information: [address] [place] [address] [phone]             Duration of  Discharge Encounter: Greater than 35 minutes   Signed, Amy Clegg NP-C  11/27/2018, 10:14 AM   Patient seen and examined with the above-signed Advanced Practice Provider and/or Housestaff. I personally reviewed laboratory data, imaging studies and relevant notes. I independently examined the patient and formulated the important aspects of the plan. I have edited the note  to reflect any of my changes or salient points. I have personally discussed the plan with the patient and/or family.  He has responded well to IV dobutamine and diuresis. CVP down to 11. Co-ox improved but drifting down slightly today. No further low flows. Can go home today on IV dobutamine (we have coordinated with [facility]) and close f/u in [facility]. Can stop florinef. VAD interrogated personally. Parameters stable.  Glori Bickers, MD  10:58 AM

## 2018-11-27 NOTE — Progress Notes (Signed)
LVAD Coordinator Rounding Note:  Admitted 10/22/18 from ED with Failure to thrive and Low Flows.  HM II Implanted 10/19/13 by Dr. Cyndia Bent under Destination Therapy criteria.  Pt lying in bed asleep this morning.   Vital signs: Temp:  97.7 HR: 75 paced Doppler Pressure:80 Automatic cuff:  90/79 (84) O2 Sat: 92%on RA  Weight:  186.7>184.9>183.8>177.4 lbs  LVAD interrogation reveals:  Speed: 8400  Flow:3.4 Power: 3.9w PI: 4.9  Alarms: none since 11/25/18 Events: rare  Fixed speed: 8400 Low speed limit: 8000  Drive Line: Driveline dressing C/D/I. Weekly dressings performed by bedside nurse or SO [name]. Next dressing change due 11/28/18.   Labs:  LDH trend: 376>278>250>263  INR trend: 3.15>3.06>2.87>2.25  Hgb trend:  10.3>8.3>9.1>9.2  Coox: 56.6>52.1 %   CVP: 16 - 17  Blood products this admission: -11/25/18-1 u/PRBC   Anticoagulation Plan: - INR Goal: 2.0 - 2.5 (coumadin on hold due to current GI bleed) - ASA Dose: no ASA (hx of GI bleed)   Device: - Dual Medtronic ICD - VF therapies: on at 200 bpm - AT/AF monitor on: 140 bpm  - Last device check 09/30/18 per Tomi Bamberger; underlying rhythm [place]  Significant Events on VAD Support:  09/01/14>>ureteral stone with cystoscopy and flexible ureteroscopy with lithotripsy and basket extraction 02/2015 >>GIB with 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 Started on Plavix 3/17>>Lumbar fusion L3-L5 9/17>>VT with DC-CV; amio increased 10/17>>ramp with RV failure; speed decreased to 8800 02/2017> GIB (removed ASA, scopes negative) 03/2017>percutaneous lead repair after pump stops  10/18>>Slow VT. Cardioverted in ED and amiodarone uptitrated 12/18>>melena with scope. Hgb 6.5. EGD with 2 AVMs with APC 12/18>>recurrent hematuria. 6 mm left ureteral stone 1/19>>7 mm  left ureteral stone removal with laser lithotripsy; left ureteral stent. Resection of 6 cm bladder tumor 11/19>>Bladder tumor resections. Gross hematuria requiring CBI and radiation treatments.  11/24/18>>Initiation of Dobutamine  Plan/Recommendations:  1. Please call VAD pager if any issues with VAD equipment or questions, concerns. 2. Weekly dressing changes, next dressing change due 11/28/18. Bedside nurse may change dressing.  3. Pt ok to discharge today. Follow up in VAD clinic scheduled for next Thursday at 11:00am.   Tanda Rockers RN [place] Coordinator  Office: [phone]  24/7 Pager: [phone]

## 2018-11-28 ENCOUNTER — Ambulatory Visit: Payer: Medicare Other

## 2018-12-01 ENCOUNTER — Ambulatory Visit: Payer: Medicare Other

## 2018-12-02 ENCOUNTER — Ambulatory Visit (HOSPITAL_COMMUNITY): Payer: Self-pay | Admitting: Pharmacist

## 2018-12-02 ENCOUNTER — Ambulatory Visit: Payer: Medicare Other

## 2018-12-02 ENCOUNTER — Telehealth: Payer: Self-pay | Admitting: *Deleted

## 2018-12-02 LAB — POCT INR: INR: 1.8 — AB (ref 2.0–3.0)

## 2018-12-02 NOTE — Telephone Encounter (Signed)
[facility] called to inquiry about following up on missed appointments for patient.  He was recently admitted and discharged and hasn't shown up for any of his appointments and techs have been unable to reach patient.    Called significant other and she advised that patients treatment is on hold per Dr Harvie Bridge.  He goes for follow up on 12/04/2018 and she will call back to advise if he has received clearance at that time.  Message relayed to [facility]

## 2018-12-03 ENCOUNTER — Ambulatory Visit: Payer: Medicare Other

## 2018-12-03 ENCOUNTER — Other Ambulatory Visit (HOSPITAL_COMMUNITY): Payer: Self-pay | Admitting: *Deleted

## 2018-12-03 DIAGNOSIS — Z95811 Presence of heart assist device: Secondary | ICD-10-CM

## 2018-12-03 DIAGNOSIS — Z7901 Long term (current) use of anticoagulants: Secondary | ICD-10-CM

## 2018-12-03 DIAGNOSIS — I5043 Acute on chronic combined systolic (congestive) and diastolic (congestive) heart failure: Secondary | ICD-10-CM

## 2018-12-03 DIAGNOSIS — I50812 Chronic right heart failure: Secondary | ICD-10-CM

## 2018-12-04 ENCOUNTER — Ambulatory Visit: Payer: Medicare Other

## 2018-12-04 ENCOUNTER — Ambulatory Visit (HOSPITAL_COMMUNITY)
Admission: RE | Admit: 2018-12-04 | Discharge: 2018-12-04 | Disposition: A | Discharge status: Home / Self Care | Payer: Medicare Other | Source: Ambulatory Visit | Attending: Cardiology | Admitting: Cardiology

## 2018-12-04 DIAGNOSIS — Z7901 Long term (current) use of anticoagulants: Secondary | ICD-10-CM | POA: Diagnosis not present

## 2018-12-04 DIAGNOSIS — I440 Atrioventricular block, first degree: Secondary | ICD-10-CM | POA: Insufficient documentation

## 2018-12-04 DIAGNOSIS — Z8673 Personal history of transient ischemic attack (TIA), and cerebral infarction without residual deficits: Secondary | ICD-10-CM | POA: Insufficient documentation

## 2018-12-04 DIAGNOSIS — I2510 Atherosclerotic heart disease of native coronary artery without angina pectoris: Secondary | ICD-10-CM | POA: Diagnosis not present

## 2018-12-04 DIAGNOSIS — E039 Hypothyroidism, unspecified: Secondary | ICD-10-CM | POA: Insufficient documentation

## 2018-12-04 DIAGNOSIS — I5043 Acute on chronic combined systolic (congestive) and diastolic (congestive) heart failure: Secondary | ICD-10-CM

## 2018-12-04 DIAGNOSIS — I255 Ischemic cardiomyopathy: Secondary | ICD-10-CM | POA: Insufficient documentation

## 2018-12-04 DIAGNOSIS — Z95811 Presence of heart assist device: Secondary | ICD-10-CM | POA: Diagnosis not present

## 2018-12-04 DIAGNOSIS — N1330 Unspecified hydronephrosis: Secondary | ICD-10-CM | POA: Insufficient documentation

## 2018-12-04 DIAGNOSIS — G4730 Sleep apnea, unspecified: Secondary | ICD-10-CM | POA: Diagnosis not present

## 2018-12-04 DIAGNOSIS — I472 Ventricular tachycardia, unspecified: Secondary | ICD-10-CM

## 2018-12-04 DIAGNOSIS — Z7902 Long term (current) use of antithrombotics/antiplatelets: Secondary | ICD-10-CM | POA: Insufficient documentation

## 2018-12-04 DIAGNOSIS — I252 Old myocardial infarction: Secondary | ICD-10-CM | POA: Insufficient documentation

## 2018-12-04 DIAGNOSIS — Z95810 Presence of automatic (implantable) cardiac defibrillator: Secondary | ICD-10-CM | POA: Diagnosis not present

## 2018-12-04 DIAGNOSIS — I480 Paroxysmal atrial fibrillation: Secondary | ICD-10-CM | POA: Insufficient documentation

## 2018-12-04 DIAGNOSIS — I50812 Chronic right heart failure: Secondary | ICD-10-CM | POA: Diagnosis not present

## 2018-12-04 DIAGNOSIS — M1990 Unspecified osteoarthritis, unspecified site: Secondary | ICD-10-CM | POA: Insufficient documentation

## 2018-12-04 DIAGNOSIS — D494 Neoplasm of unspecified behavior of bladder: Secondary | ICD-10-CM | POA: Diagnosis not present

## 2018-12-04 DIAGNOSIS — N183 Chronic kidney disease, stage 3 (moderate): Secondary | ICD-10-CM | POA: Diagnosis not present

## 2018-12-04 DIAGNOSIS — I471 Supraventricular tachycardia: Secondary | ICD-10-CM | POA: Insufficient documentation

## 2018-12-04 DIAGNOSIS — Z85820 Personal history of malignant melanoma of skin: Secondary | ICD-10-CM | POA: Insufficient documentation

## 2018-12-04 DIAGNOSIS — Z955 Presence of coronary angioplasty implant and graft: Secondary | ICD-10-CM | POA: Insufficient documentation

## 2018-12-04 DIAGNOSIS — E785 Hyperlipidemia, unspecified: Secondary | ICD-10-CM | POA: Diagnosis not present

## 2018-12-04 DIAGNOSIS — I130 Hypertensive heart and chronic kidney disease with heart failure and stage 1 through stage 4 chronic kidney disease, or unspecified chronic kidney disease: Secondary | ICD-10-CM | POA: Diagnosis not present

## 2018-12-04 DIAGNOSIS — I5023 Acute on chronic systolic (congestive) heart failure: Secondary | ICD-10-CM | POA: Insufficient documentation

## 2018-12-04 DIAGNOSIS — Z79899 Other long term (current) drug therapy: Secondary | ICD-10-CM | POA: Insufficient documentation

## 2018-12-04 LAB — CBC
HCT: 33.9 % — ABNORMAL LOW (ref 39.0–52.0)
Hemoglobin: 10.3 g/dL — ABNORMAL LOW (ref 13.0–17.0)
MCH: 33 pg (ref 26.0–34.0)
MCHC: 30.4 g/dL (ref 30.0–36.0)
MCV: 108.7 fL — ABNORMAL HIGH (ref 80.0–100.0)
NRBC: 0 % (ref 0.0–0.2)
Platelets: 147 10*3/uL — ABNORMAL LOW (ref 150–400)
RBC: 3.12 MIL/uL — ABNORMAL LOW (ref 4.22–5.81)
RDW: 19.9 % — ABNORMAL HIGH (ref 11.5–15.5)
WBC: 4.8 10*3/uL (ref 4.0–10.5)

## 2018-12-04 LAB — BASIC METABOLIC PANEL
ANION GAP: 6 (ref 5–15)
BUN: 13 mg/dL (ref 8–23)
CO2: 23 mmol/L (ref 22–32)
Calcium: 9 mg/dL (ref 8.9–10.3)
Chloride: 104 mmol/L (ref 98–111)
Creatinine, Ser: 1.44 mg/dL — ABNORMAL HIGH (ref 0.61–1.24)
GFR calc Af Amer: 53 mL/min — ABNORMAL LOW (ref >60)
GFR calc non Af Amer: 46 mL/min — ABNORMAL LOW (ref >60)
Glucose, Bld: 92 mg/dL (ref 70–99)
Potassium: 5.1 mmol/L (ref 3.5–5.1)
Sodium: 133 mmol/L — ABNORMAL LOW (ref 135–145)

## 2018-12-04 LAB — MAGNESIUM: Magnesium: 2.3 mg/dL (ref 1.7–2.4)

## 2018-12-04 LAB — LACTATE DEHYDROGENASE: LDH: 281 U/L — ABNORMAL HIGH (ref 98–192)

## 2018-12-04 NOTE — Progress Notes (Signed)
Patient ID: [name], male   DOB: 1940-11-19, 78 y.o.   MRN: [id number]   VAD CLINIC PROGRESS NOTE  PCP: Dr. Kandice Robinsons ([place]) GU: Dr Gloriann Loan Neuro Surgeon: Dr Vertell Limber Vascular: Dr Kellie Simmering Primary HF: Dr Darlyn Read [name] is a 78 y.o. male  with h/o VT, PAD and severe systolic HF (EF 83-38%) due to mixed ischemic/nonischemic CM he is s/p HM II LVAD implant for destination therapy on October 19, 2013.VAD implantation was complicated by VT, syncope, AF/Aflutter and RHF. Required short term milrinone.   GU Event 09/01/14 had impacted ureteral stone and underwent cystoscopy and flexible ureteroscopy with lithotripsy and basket extraction.  09/2015 Hematuria and chronic nephrolithiasis. Renal US showed mild hyronephrosis and bilateral renal cysts. Dr. Risa Grill took back for repeat lithotripsy and stone extraction by ureteroscopy and J stent placement. 12/18 Recurrent hematuria. 28mm left ureteral stone. Passed with tamulosin 1/19   Admitted with recurrent hematuria with ureteral stone. Stone removed but found to have large bladder mass which was removed. Course c/b obstructive uropathy and AKI requiring 1 session of HD.  3/19 Bladder chemo stopped after 2 treatments as unable to tolerate 05/16/18 s/p TURB with right ureteral stent placement.  09/2018 Recurrent bladder tumor excision with persistent hematuria requiring XRT  GI Event  02/2015 - Hgb 5.4 --> EGD that showed 2 AVMs in the jejunum that were clipped.  4/2-18 - Episode of melena. Hgb 10.9-> 8.7. EGD/colonoscopy negative 12/18 - Melena with syncope  Hgb 6.5. Transfused 5u. EGD with 2 AVMs with APC 10/24/18 EGD showed one non-bleeding AVM with APC.  10/25/18 Capsule results delayed due to software upgrade and holiday.    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  01/2016 R CEA. He was started on Plavix.  01/2016 Lumbar fusion L3-L5 with pedicle screws posterolateral arthrodesis with BMP, allograft  VT events 9/17  with high fevers and productive cough. Treated with levaquin x 7 days for acute bronchitis. Also found to have VT and underwent DC-CV. Seen by EP and amio increased 10/17 speed turned down to 8800 due to RV failure. 10/18 with slow VT and ADHF.Cardioverted in ED and amio uptitrated.  04/01/18 recurrent VT. Underwent DC-CV in ErR9/4/19 presented to ED. Urgently cardioverted. EP started on Sotolol.  11/07/18 Recurrent VT - DC-CV in ER 11/16/18 VT - ICD shock at home  Significant VAD Events  03/2017>percutaneous lead repair  11/24/18 Started on dobutamine for RV failure   Here for post hospital VAD f/u: Admitted 11/24/18 for dobutamine initiation due to refractory RV failure and persistent low flow alarms. Now on dobutamine 2.5. feels much better. Able to do ADLs without problem. Undergoing home PT. Denies any low flow alarms. LE edema much improved despite not taking lasix. No ICD firings. Denies orthopnea or PND. No fevers, chills or problems with driveline. No bleeding, melena or neuro symptoms. No VAD alarms. Taking all meds as prescribed.    VAD Indication: Destination Therapy- age excluding    VAD interrogation & Equipment Management: Speed: 8400 Flow:  3.0 Power: 3.8w PI: 5.5  Alarms: none Events: rare  Fixed speed 8400 Low speed limit: 8000  Primary Controller: Replace back up battery in 22 months Back up controller: Replace back up battery in 66months    Past Medical History:  Diagnosis Date  . Arthritis   . Automatic implantable cardioverter-defibrillator in situ    a. s/p Medtronic Evera device serial [id number]    . Bradycardia   . CAD (coronary artery  disease)    a. s/p MI 1982;  s/p DES to CFX 2011;  s/p NSTEMI 4/12 in setting of VTach;  cath 7/12:  LM ok, LAD mid 30-40%, Dx's with 40%, mCFX stent patent, prox to mid RCA occluded with R to R and L to R collats.  Medical Tx was continued  . Chronic systolic heart failure ([place])    a. s/p LVAD 10/2013 for  destination therapy  . CKD (chronic kidney disease)   . CVD (cerebrovascular disease)    a. s/p L CEA 2008  . Cytopenia   . Dysrhythmia    AFIB  . History of GI bleed    a. on ASA- started on plavix during 10/2015 admission for CVA  . HLD (hyperlipidemia)   . HTN (hypertension)   . Hypothyroidism   . Inappropriate therapy from implantable cardioverter-defibrillator    a. ATP for sinus tach SVT wavelet identified SVT but was no  passive (2014)  . Ischemic cardiomyopathy   . 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 L Ear   . Myocardial infarction ([place])    1992  . PAF (paroxysmal atrial fibrillation) ([place])   . PVD (peripheral vascular disease) ([place])    a. 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 Shortness of breath dyspnea    W/ EXERTION   . Sleep apnea    NO CPAP NOW  HE SENT IT BACK YRS AGO  . Stroke [facility])    a. 10/2015 admission   . Ventricular tachycardia ([place])    a. s/p AICD;   h/o ICD shock;  Amiodarone Rx. S/P ablation in 2012    Current Outpatient Medications  Medication Sig Dispense Refill  . amiodarone (PACERONE) 200 MG tablet Take 1 tablet (200 mg total) by mouth 2 (two) times daily. (Patient taking differently: Take 400 mg by mouth 2 (two) times daily. ) 120 tablet 5  . atorvastatin (LIPITOR) 80 MG tablet Take 1 tablet (80 mg total) by mouth at bedtime. 90 tablet 1  . DOBUTamine (DOBUTREX) 4-5 MG/ML-% infusion Inject 211.75 mcg/min into the vein continuous. 250 mL 12  . docusate sodium (COLACE) 100 MG capsule Take 100 mg by mouth daily.     [name] levothyroxine (SYNTHROID, LEVOTHROID) 150 MCG tablet Take 1 tablet (150 mcg total) by mouth daily before breakfast. 90 tablet 3  . midodrine (PROAMATINE) 10 MG tablet Take 1 tablet (10 mg total) by mouth 3 (three) times daily with meals. 90 tablet 3  . Multiple Vitamin (MULTIVITAMIN WITH MINERALS) TABS tablet Take 1 tablet by mouth daily.    .  pantoprazole (PROTONIX) 40 MG tablet TAKE 1 TABLET TWICE DAILY (Patient taking differently: Take 40 mg by mouth 2 (two) times daily before a meal. ) 180 tablet 6  . potassium chloride SA (K-DUR,KLOR-CON) 20 MEQ tablet Take 1 tablet (20 mEq total) by mouth daily. 90 tablet 3  . protein supplement shake (PREMIER PROTEIN) LIQD Take 11 oz by mouth once.     . sotalol (BETAPACE) 80 MG tablet Take 1 tablet (80 mg total) by mouth 2 (two) times daily. 60 tablet 6  . spironolactone (ALDACTONE) 25 MG tablet Take 0.5 tablets (12.5 mg total) by mouth daily. 90 tablet 3  . temazepam (RESTORIL) 30 MG capsule Take 1 capsule (30 mg total) by mouth at bedtime as needed for sleep. Adeline  capsule 3  . warfarin (COUMADIN) 2 MG tablet Take 2 mg daily, further as directed by VAD clinic. (Patient taking differently: Take 2 mg by mouth daily. )    . furosemide (LASIX) 20 MG tablet Take 20 mg by mouth daily as needed for fluid.      No current facility-administered medications for this encounter.     Demerol [meperidine] and Coconut oil  Review of systems complete and found to be negative unless listed in HPI.    Vital Signs:   Doppler Pressure: 100 Automatc BP:  112/87(99) HR: 75 SPO2: 99%  Weight: 181 lb w/o eqt Last weight: 183.8 lb   Physical exam: General:  NAD.  HEENT: normal  Neck: supple. JVP 9-10 with prominent CV waves.  Carotids 2+ bilat; no bruits. No lymphadenopathy or thryomegaly appreciated. Cor: LVAD hum.  Lungs: Clear. Abdomen: soft, nontender, non-distended. No hepatosplenomegaly. No bruits or masses. Good bowel sounds. Driveline site clean. Anchor in place.  Extremities: no cyanosis, clubbing, rash. Warm no edema  Neuro: alert & oriented x 3. No focal deficits. Moves all 4 without problem    ASSESSMENT AND PLAN:   1. Acute on chronic systolic HF with prominent RV failure: s/p HM-II LVAD 10/2014. s/p previous driveline [id number]. No further pump stops. RHC in 1/19 with mildly low  output.  - Had refractory RV failure with persistent low flow alarms and NYHA IV symptoms. Dobutamine started on 11/24/18. Now improved. NYHA III. Low flow alarms resolved currently - Volume status has improved with doubtamine support. No need for lasix currently - VAD speed 8400 - VAD interrogated personally. Parameters stable.  2.Ventricular tachycardia: Underwent DC-CV 9/17, 08/17/17, and 04/01/18.  S/p emergent DC-CV in ER 07/09/18. S/p DC-CV in ER on 1/3 -  Recurrent VT on 1/12 at rate ~200 bpm. Failed ATP which sped up VT to ~300 bpm and then was shocked x 1 - Device reprogrammed to avoid shocks at home. VT zone 118-230 monitor only. No ATP. > 230 Shock - No recent events - Continue amio and sotalol.  - Keep K > 4.0 Mg > 2.0  3. Recurrent high-grade bladder tumor:  - S/p stone removal.  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  Had 2 BCG infusions with Dr. Gloriann Loan but discontinued due to inability to tolerate. - S/p repeat resection 7/19.  - s/p repeat bladder tumor excision in 11/19 with fast-growing tumor. C/b bilateral hydronephrosis and persistent hematuria requiring XRT. - no recurrent hematuria - Ok to resume palliative XRT with Dr. Tammi Klippel. Has 6 treatments left.   4. CKD, stage III:  Had AKI in setting of obstructive uropathy and ATN. Required HD x1(11/2017).   - Stable. Cr 1.44 today.  -Not interested in nephrostomy tubes for hydronephrosis  5. LVAD:  - VAD interrogated personally. Parameters stable. Low flows resolved with dobutamine.  - Continue speed at 8400 - INR 1.8 Keep INR closer to 2.0 with bladder lesion. Discussed dosing with PharmD personally. - LDH 281  6. GIB:  s/p clipping of 2 jejunal AVMs in 4/16. Continue coumadin and plavix.(with h/o CVA). Admitted 4/18 with recurrent GIB. Transfused 2u. EGD/colon negative. Admitted 12./18. hgb 6.5, Transfused 5u. EGD with 2 AVMs treated with APC.  - 10/24/18 EGD showed one non-bleeding AVM with APC.  -  10/25/18 Capsule with 2 clear AVMs noted, one proximal duodenum and the other suspected jejunum. Refuses Octreotide due to cost.  - Hgb stable at 10.3. He denies black stools.   7.  Atrial arrhythmias: Atrial fibrillation, atrial tachycardia. s/p multiple DC-CVs last 07/02/18.  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  - Continue amiodarone and sotalol.   8. H/o amio-induced hypethyroidism:  Followed by Dr. Forde Dandy in past.  - Amio restarted due to recurrent atrial arrhythmias and VT.  - Follow.   9. Hypokalemia/hyperkalemia - K 5.1 today. Off lasix. Can stop KCl. Follow q 2 weeks  Total time spent 45 minutes. Over half that time spent discussing above.     Glori Bickers, MD  5:17 PM

## 2018-12-04 NOTE — Progress Notes (Signed)
Patient presents for hospital f/u  in [facility] today with caregiver, [name]. Pt denies any alarms from his device.   Pt states that he is feeling much better since starting the Dobutamine. The pt has not had anymore low flows since hospital d/c.  Vital Signs:   Doppler Pressure: 100 Automatc BP:  112/87(99) HR: 75 SPO2: 99%  Weight: 181 lb w/o eqt Last weight: 183.8 lb   VAD Indication: Destination Therapy- age excluding    VAD interrogation & Equipment Management: Speed: 8400 Flow:  3.0 Power: 3.8w    PI: 5.5  Alarms: none Events: rare  Fixed speed 8400 Low speed limit: 8000  Primary Controller: Replace back up battery in 22 months Back up controller: Replace back up battery in 61months  Exit Site Care: Drive line is being maintained weekly by [name]. Drive line exit site well healed and incorporated. The velour is fully implanted at exit site. Dressing dry and intact. No erythema or drainage. Stabilization device present and accurately applied. Pt denies fever or chills.    Significant Events on VAD Support:  02/2015 >>GIB (removed ASA, 2-2.5 INR) 10/2015 >>CVA(Plavix started) 02/2017> GIB (removed ASA, scopes negative) 03/2017> percutaneous lead repair 12/2017> Bladder CA  Device:Medtronic Therapies: on 230 bpm AT monitor: 118-230 bpm Last check: today-Brandon over to reprogram with above settings per DR. Bensimhon   BP & Labs:  MAP 100 - Doppler is reflecting MAP  Hgb 10.3 - Denies any signs/symptoms of bleeding or nosebleeds.   LDH 281  - with established baseline of 280-380. Denies tea-colored urine. No power elevations noted on interrogation.    Patient Instructions: 1. Stop Potassium. 2. Return to clinic in 2 weeks.   Tanda Rockers RN [place] Coordinator  Office: [phone]  24/7 Pager: [phone]

## 2018-12-05 ENCOUNTER — Ambulatory Visit: Payer: Medicare Other

## 2018-12-08 ENCOUNTER — Ambulatory Visit: Payer: Medicare Other

## 2018-12-08 ENCOUNTER — Telehealth: Payer: Self-pay | Admitting: Oncology

## 2018-12-08 ENCOUNTER — Ambulatory Visit
Admission: RE | Admit: 2018-12-08 | Discharge: 2018-12-08 | Disposition: A | Discharge status: Home / Self Care | Payer: Medicare Other | Source: Ambulatory Visit | Attending: Radiation Oncology | Admitting: Radiation Oncology

## 2018-12-08 DIAGNOSIS — C679 Malignant neoplasm of bladder, unspecified: Secondary | ICD-10-CM | POA: Diagnosis present

## 2018-12-08 DIAGNOSIS — Z510 Encounter for antineoplastic radiation therapy: Secondary | ICD-10-CM | POA: Diagnosis not present

## 2018-12-08 NOTE — Telephone Encounter (Signed)
Faxed most recent office note to [facility]. Release IA#[id number]

## 2018-12-09 ENCOUNTER — Ambulatory Visit: Payer: Medicare Other

## 2018-12-09 ENCOUNTER — Ambulatory Visit (HOSPITAL_COMMUNITY): Payer: Self-pay | Admitting: Pharmacist

## 2018-12-09 ENCOUNTER — Ambulatory Visit
Admission: RE | Admit: 2018-12-09 | Discharge: 2018-12-09 | Disposition: A | Discharge status: Home / Self Care | Payer: Medicare Other | Source: Ambulatory Visit | Attending: Radiation Oncology | Admitting: Radiation Oncology

## 2018-12-09 DIAGNOSIS — C679 Malignant neoplasm of bladder, unspecified: Secondary | ICD-10-CM | POA: Diagnosis not present

## 2018-12-09 LAB — POCT INR: INR: 2.3 (ref 2.0–3.0)

## 2018-12-10 ENCOUNTER — Ambulatory Visit
Admission: RE | Admit: 2018-12-10 | Discharge: 2018-12-10 | Disposition: A | Discharge status: Home / Self Care | Payer: Medicare Other | Source: Ambulatory Visit | Attending: Radiation Oncology | Admitting: Radiation Oncology

## 2018-12-10 ENCOUNTER — Ambulatory Visit: Payer: Medicare Other

## 2018-12-10 DIAGNOSIS — C679 Malignant neoplasm of bladder, unspecified: Secondary | ICD-10-CM | POA: Diagnosis not present

## 2018-12-11 ENCOUNTER — Ambulatory Visit: Payer: Medicare Other

## 2018-12-11 ENCOUNTER — Encounter (HOSPITAL_COMMUNITY): Payer: Medicare Other

## 2018-12-11 ENCOUNTER — Ambulatory Visit
Admission: RE | Admit: 2018-12-11 | Discharge: 2018-12-11 | Disposition: A | Discharge status: Home / Self Care | Payer: Medicare Other | Source: Ambulatory Visit | Attending: Radiation Oncology | Admitting: Radiation Oncology

## 2018-12-11 DIAGNOSIS — C679 Malignant neoplasm of bladder, unspecified: Secondary | ICD-10-CM | POA: Diagnosis not present

## 2018-12-12 ENCOUNTER — Ambulatory Visit: Payer: Medicare Other

## 2018-12-12 ENCOUNTER — Ambulatory Visit
Admission: RE | Admit: 2018-12-12 | Discharge: 2018-12-12 | Disposition: A | Discharge status: Home / Self Care | Payer: Medicare Other | Source: Ambulatory Visit | Attending: Radiation Oncology | Admitting: Radiation Oncology

## 2018-12-12 DIAGNOSIS — C679 Malignant neoplasm of bladder, unspecified: Secondary | ICD-10-CM | POA: Diagnosis not present

## 2018-12-15 ENCOUNTER — Ambulatory Visit
Admission: RE | Admit: 2018-12-15 | Discharge: 2018-12-15 | Disposition: A | Discharge status: Home / Self Care | Payer: Medicare Other | Source: Ambulatory Visit | Attending: Radiation Oncology | Admitting: Radiation Oncology

## 2018-12-15 DIAGNOSIS — C679 Malignant neoplasm of bladder, unspecified: Secondary | ICD-10-CM | POA: Diagnosis not present

## 2018-12-16 ENCOUNTER — Ambulatory Visit (HOSPITAL_COMMUNITY): Payer: Self-pay | Admitting: Pharmacist

## 2018-12-16 ENCOUNTER — Other Ambulatory Visit (HOSPITAL_COMMUNITY): Payer: Self-pay | Admitting: Internal Medicine

## 2018-12-16 DIAGNOSIS — E079 Disorder of thyroid, unspecified: Secondary | ICD-10-CM

## 2018-12-16 LAB — POCT INR: INR: 1.9 — AB (ref 2.0–3.0)

## 2018-12-17 ENCOUNTER — Other Ambulatory Visit (HOSPITAL_COMMUNITY): Payer: Self-pay | Admitting: *Deleted

## 2018-12-17 DIAGNOSIS — Z95811 Presence of heart assist device: Secondary | ICD-10-CM

## 2018-12-17 DIAGNOSIS — Z7901 Long term (current) use of anticoagulants: Secondary | ICD-10-CM

## 2018-12-18 ENCOUNTER — Ambulatory Visit (HOSPITAL_COMMUNITY)
Admission: RE | Admit: 2018-12-18 | Discharge: 2018-12-18 | Disposition: A | Discharge status: Home / Self Care | Payer: Medicare Other | Source: Ambulatory Visit | Attending: Cardiology | Admitting: Cardiology

## 2018-12-18 ENCOUNTER — Encounter (HOSPITAL_COMMUNITY): Payer: Self-pay

## 2018-12-18 VITALS — BP 96/55 | HR 75 | Ht 72.0 in | Wt 170.0 lb

## 2018-12-18 DIAGNOSIS — I2510 Atherosclerotic heart disease of native coronary artery without angina pectoris: Secondary | ICD-10-CM | POA: Insufficient documentation

## 2018-12-18 DIAGNOSIS — Z95810 Presence of automatic (implantable) cardiac defibrillator: Secondary | ICD-10-CM | POA: Insufficient documentation

## 2018-12-18 DIAGNOSIS — I255 Ischemic cardiomyopathy: Secondary | ICD-10-CM | POA: Diagnosis not present

## 2018-12-18 DIAGNOSIS — Z955 Presence of coronary angioplasty implant and graft: Secondary | ICD-10-CM | POA: Diagnosis not present

## 2018-12-18 DIAGNOSIS — Z95811 Presence of heart assist device: Secondary | ICD-10-CM | POA: Diagnosis not present

## 2018-12-18 DIAGNOSIS — I252 Old myocardial infarction: Secondary | ICD-10-CM | POA: Diagnosis not present

## 2018-12-18 DIAGNOSIS — N183 Chronic kidney disease, stage 3 unspecified: Secondary | ICD-10-CM

## 2018-12-18 DIAGNOSIS — Z8673 Personal history of transient ischemic attack (TIA), and cerebral infarction without residual deficits: Secondary | ICD-10-CM | POA: Insufficient documentation

## 2018-12-18 DIAGNOSIS — D494 Neoplasm of unspecified behavior of bladder: Secondary | ICD-10-CM

## 2018-12-18 DIAGNOSIS — M1990 Unspecified osteoarthritis, unspecified site: Secondary | ICD-10-CM | POA: Insufficient documentation

## 2018-12-18 DIAGNOSIS — R001 Bradycardia, unspecified: Secondary | ICD-10-CM | POA: Diagnosis not present

## 2018-12-18 DIAGNOSIS — G4730 Sleep apnea, unspecified: Secondary | ICD-10-CM | POA: Diagnosis not present

## 2018-12-18 DIAGNOSIS — I440 Atrioventricular block, first degree: Secondary | ICD-10-CM | POA: Diagnosis not present

## 2018-12-18 DIAGNOSIS — Z79899 Other long term (current) drug therapy: Secondary | ICD-10-CM | POA: Insufficient documentation

## 2018-12-18 DIAGNOSIS — N170 Acute kidney failure with tubular necrosis: Secondary | ICD-10-CM | POA: Diagnosis not present

## 2018-12-18 DIAGNOSIS — E039 Hypothyroidism, unspecified: Secondary | ICD-10-CM | POA: Insufficient documentation

## 2018-12-18 DIAGNOSIS — N1330 Unspecified hydronephrosis: Secondary | ICD-10-CM | POA: Diagnosis not present

## 2018-12-18 DIAGNOSIS — Z7901 Long term (current) use of anticoagulants: Secondary | ICD-10-CM

## 2018-12-18 DIAGNOSIS — I471 Supraventricular tachycardia: Secondary | ICD-10-CM | POA: Insufficient documentation

## 2018-12-18 DIAGNOSIS — E785 Hyperlipidemia, unspecified: Secondary | ICD-10-CM | POA: Insufficient documentation

## 2018-12-18 DIAGNOSIS — I130 Hypertensive heart and chronic kidney disease with heart failure and stage 1 through stage 4 chronic kidney disease, or unspecified chronic kidney disease: Secondary | ICD-10-CM | POA: Diagnosis not present

## 2018-12-18 DIAGNOSIS — I472 Ventricular tachycardia: Secondary | ICD-10-CM | POA: Diagnosis not present

## 2018-12-18 DIAGNOSIS — I50812 Chronic right heart failure: Secondary | ICD-10-CM

## 2018-12-18 DIAGNOSIS — Z7902 Long term (current) use of antithrombotics/antiplatelets: Secondary | ICD-10-CM | POA: Diagnosis not present

## 2018-12-18 DIAGNOSIS — I480 Paroxysmal atrial fibrillation: Secondary | ICD-10-CM | POA: Insufficient documentation

## 2018-12-18 DIAGNOSIS — I5022 Chronic systolic (congestive) heart failure: Secondary | ICD-10-CM | POA: Insufficient documentation

## 2018-12-18 DIAGNOSIS — E46 Unspecified protein-calorie malnutrition: Secondary | ICD-10-CM | POA: Diagnosis not present

## 2018-12-18 LAB — LACTATE DEHYDROGENASE: LDH: 224 U/L — ABNORMAL HIGH (ref 98–192)

## 2018-12-18 LAB — CBC
HCT: 35.8 % — ABNORMAL LOW (ref 39.0–52.0)
Hemoglobin: 11.3 g/dL — ABNORMAL LOW (ref 13.0–17.0)
MCH: 33.5 pg (ref 26.0–34.0)
MCHC: 31.6 g/dL (ref 30.0–36.0)
MCV: 106.2 fL — ABNORMAL HIGH (ref 80.0–100.0)
NRBC: 0 % (ref 0.0–0.2)
Platelets: 127 10*3/uL — ABNORMAL LOW (ref 150–400)
RBC: 3.37 MIL/uL — ABNORMAL LOW (ref 4.22–5.81)
RDW: 17.8 % — ABNORMAL HIGH (ref 11.5–15.5)
WBC: 5.1 10*3/uL (ref 4.0–10.5)

## 2018-12-18 LAB — BASIC METABOLIC PANEL
ANION GAP: 7 (ref 5–15)
BUN: 19 mg/dL (ref 8–23)
CO2: 23 mmol/L (ref 22–32)
Calcium: 8.8 mg/dL — ABNORMAL LOW (ref 8.9–10.3)
Chloride: 106 mmol/L (ref 98–111)
Creatinine, Ser: 1.41 mg/dL — ABNORMAL HIGH (ref 0.61–1.24)
GFR calc Af Amer: 55 mL/min — ABNORMAL LOW (ref >60)
GFR, EST NON AFRICAN AMERICAN: 47 mL/min — AB (ref >60)
Glucose, Bld: 96 mg/dL (ref 70–99)
Potassium: 4.5 mmol/L (ref 3.5–5.1)
Sodium: 136 mmol/L (ref 135–145)

## 2018-12-18 LAB — MAGNESIUM: Magnesium: 2.1 mg/dL (ref 1.7–2.4)

## 2018-12-18 NOTE — Progress Notes (Addendum)
Patient presents for two week  f/u  in [facility] today with caregiver, [name]. Pt reported he heard one low flow alarm on the way to clinic today.    Pt reports he is not feeling well today; reports he is "very weak". Pt says home Physical Therapy has been coming out twice weekly and he has been walking @ 200 feet. He feels fatigued and SOB at that point.   Wife reports he is drinking 20 - 30 oz total daily; she also reports he is not eating well. Barrett says he has no appetite and doesn't want anything to drink. Stressed importance of hydrating with LVAD pump.  Labs reviewed with Dr. Haroldine Laws; gave patient 1000 cc NS bolus via right upper arm dual lumen PICC line.   Unable to draw Co-ox. Pt remains on Dobutamine 2.5 mcg/kg/min via right PICC.  Pt completed OP radiation for bladder cancer. Reports he has f/u with Oncology (Dr. Tammi Klippel) on 01/15/19. He was told he would need to f/u with Dr. Gloriann Loan (Urology) for repeat cystoscopy 6 - 8 weeks after completion of radiation.   Tomi Bamberger (Medtronic) in room for device interrogation. Pt is currently A paced with 1st degree AV block at rate of 75 bpm. No episodes of atrial or ventricular arrhythmias since last visit. Optivol reveals very low fluid status.   Vital Signs:   Doppler Pressure: 80 Automatc BP:  96/55 (73) HR: 75 SPO2: 100%  Weight: 170 lb w/o eqt Last weight: 183.8 lb   VAD Indication: Destination Therapy- age excluding    VAD interrogation & Equipment Management: Speed: 8400 Flow:  2.6 Power: 3.6w    PI: 5.2  Alarms: 12 low flow today Events: rare  Fixed speed 8400 Low speed limit: 8000  Primary Controller: Replace back up battery in 21 months Back up controller: Replace back up battery in 78months  Exit Site Care: Drive line is being maintained weekly by [name]. Drive line exit site well healed and incorporated. The velour is fully implanted at exit site. Dressing dry and intact. No erythema or drainage.  Stabilization device present and accurately applied. Pt denies fever or chills. Wife reports they have adequate supplies at home.    Significant Events on VAD Support:  02/2015 >>GIB (removed ASA, 2-2.5 INR) 10/2015 >>CVA(Plavix started) 02/2017> GIB (removed ASA, scopes negative) 03/2017> percutaneous lead repair 12/2017> Bladder CA  Device:Medtronic Therapies: on 230 bpm AT monitor: 118-230 bpm Last check: today-Brandon over to reprogram with above settings per DR. Bensimhon   BP & Labs:  MAP 80 - Doppler is reflecting MAP  Hgb 11.3 - Denies any signs/symptoms of bleeding or nosebleeds.   LDH 224- with established baseline of 280-380. Denies tea-colored urine. No power elevations noted on interrogation.    Patient Instructions: 1. No change in meds. 2. Increase fluid intake. 2. Return to clinic in 3 weeks.   Zada Girt, RN VAD Coordinator  Office: [phone]  24/7 Pager: [phone]

## 2018-12-19 ENCOUNTER — Encounter: Payer: Self-pay | Admitting: Radiation Oncology

## 2018-12-19 NOTE — Progress Notes (Signed)
Patient ID: [name], male   DOB: [birth year], 78 y.o.   MRN: [id number]   VAD CLINIC PROGRESS NOTE  PCP: Dr. Kandice Robinsons ([place]) GU: Dr Gloriann Loan Neuro Surgeon: Dr Vertell Limber Vascular: Dr Kellie Simmering Primary HF: Dr Darlyn Read [name] is a 78 y.o. male  with h/o VT, PAD and severe systolic HF (EF 87-86%) due to mixed ischemic/nonischemic CM he is s/p HM II LVAD implant for destination therapy on October 19, 2013.VAD implantation was complicated by VT, syncope, AF/Aflutter and RHF. Required short term milrinone.   GU Event 09/01/14 had impacted ureteral stone and underwent cystoscopy and flexible ureteroscopy with lithotripsy and basket extraction.  09/2015 Hematuria and chronic nephrolithiasis. Renal US showed mild hyronephrosis and bilateral renal cysts. Dr. Risa Grill took back for repeat lithotripsy and stone extraction by ureteroscopy and J stent placement. 12/18 Recurrent hematuria. 58mm left ureteral stone. Passed with tamulosin 1/19   Admitted with recurrent hematuria with ureteral stone. Stone removed but found to have large bladder mass which was removed. Course c/b obstructive uropathy and AKI requiring 1 session of HD.  3/19 Bladder chemo stopped after 2 treatments as unable to tolerate 05/16/18 s/p TURB with right ureteral stent placement.  09/2018 Recurrent bladder tumor excision with persistent hematuria requiring XRT  GI Event  02/2015 - Hgb 5.4 --> EGD that showed 2 AVMs in the jejunum that were clipped.  4/2-18 - Episode of melena. Hgb 10.9-> 8.7. EGD/colonoscopy negative 12/18 - Melena with syncope  Hgb 6.5. Transfused 5u. EGD with 2 AVMs with APC 10/24/18 EGD showed one non-bleeding AVM with APC.  10/25/18 Capsule results delayed due to software upgrade and holiday.    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  01/2016 R CEA. He was started on Plavix.  01/2016 Lumbar fusion L3-L5 with pedicle screws posterolateral arthrodesis with BMP, allograft  VT events 9/17  with high fevers and productive cough. Treated with levaquin x 7 days for acute bronchitis. Also found to have VT and underwent DC-CV. Seen by EP and amio increased 10/17 speed turned down to 8800 due to RV failure. 10/18 with slow VT and ADHF.Cardioverted in ED and amio uptitrated.  04/01/18 recurrent VT. Underwent DC-CV in ErR9/4/19 presented to ED. Urgently cardioverted. EP started on Sotolol.  11/07/18 Recurrent VT - DC-CV in ER 11/16/18 VT - ICD shock at home  Significant VAD Events  03/2017>percutaneous lead repair  11/24/18 Started on dobutamine for RV failure   Here for regular VAD f/u: Admitted 11/24/18 for dobutamine initiation due to refractory RV failure and persistent low flow alarms. Remains on dobutamine 2.5. Recently finished palliative XRT for hematuria due to high-grade bladder tumor. Feels very weak. Has hard time with ADLs. Not much appetite. Down about 10 pounds. Denies orthopnea or PND. No fevers, chills or problems with driveline. No bleeding, melena or neuro symptoms. No VAD alarms. Taking all meds as prescribed.    VAD Indication: Destination Therapy- age excluding    VAD interrogation & Equipment Management: Speed: 8400 Flow:  2.6 Power: 3.6w PI: 5.2  Alarms: 12 low flow today Events: rare  Fixed speed 8400 Low speed limit: 8000  Primary Controller: Replace back up battery in 21 months Back up controller: Replace back up battery in 75months     Past Medical History:  Diagnosis Date  . Arthritis   . Automatic implantable cardioverter-defibrillator in situ    a. s/p Medtronic Evera device serial [id number]    . Bradycardia   . CAD (coronary  artery disease)    a. s/p MI 1982;  s/p DES to CFX 2011;  s/p NSTEMI 4/12 in setting of VTach;  cath 7/12:  LM ok, LAD mid 30-40%, Dx's with 40%, mCFX stent patent, prox to mid RCA occluded with R to R and L to R collats.  Medical Tx was continued  . Chronic systolic heart failure ([place])    a. s/p LVAD  10/2013 for destination therapy  . CKD (chronic kidney disease)   . CVD (cerebrovascular disease)    a. s/p L CEA 2008  . Cytopenia   . Dysrhythmia    AFIB  . History of GI bleed    a. on ASA- started on plavix during 10/2015 admission for CVA  . HLD (hyperlipidemia)   . HTN (hypertension)   . Hypothyroidism   . Inappropriate therapy from implantable cardioverter-defibrillator    a. ATP for sinus tach SVT wavelet identified SVT but was no  passive (2014)  . Ischemic cardiomyopathy   . 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 L Ear   . Myocardial infarction ([place])    1992  . PAF (paroxysmal atrial fibrillation) ([place])   . PVD (peripheral vascular disease) ([place])    a. 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 Shortness of breath dyspnea    W/ EXERTION   . Sleep apnea    NO CPAP NOW  HE SENT IT BACK YRS AGO  . Stroke [facility])    a. 10/2015 admission   . Ventricular tachycardia ([place])    a. s/p AICD;   h/o ICD shock;  Amiodarone Rx. S/P ablation in 2012    Current Outpatient Medications  Medication Sig Dispense Refill  . amiodarone (PACERONE) 200 MG tablet Take 1 tablet (200 mg total) by mouth 2 (two) times daily. 120 tablet 5  . atorvastatin (LIPITOR) 80 MG tablet Take 1 tablet (80 mg total) by mouth at bedtime. 90 tablet 1  . DOBUTamine (DOBUTREX) 4-5 MG/ML-% infusion Inject 211.75 mcg/min into the vein continuous. 250 mL 12  . docusate sodium (COLACE) 100 MG capsule Take 100 mg by mouth daily.     [name] levothyroxine (SYNTHROID, LEVOTHROID) 150 MCG tablet TAKE 1 TABLET DAILY BEFORE BREAKFAST. 90 tablet 3  . midodrine (PROAMATINE) 10 MG tablet Take 1 tablet (10 mg total) by mouth 3 (three) times daily with meals. 90 tablet 3  . Multiple Vitamin (MULTIVITAMIN WITH MINERALS) TABS tablet Take 1 tablet by mouth daily.    . pantoprazole (PROTONIX) 40 MG tablet TAKE 1 TABLET TWICE DAILY 180 tablet 6  . sotalol  (BETAPACE) 80 MG tablet Take 1 tablet (80 mg total) by mouth 2 (two) times daily. 60 tablet 6  . spironolactone (ALDACTONE) 25 MG tablet Take 0.5 tablets (12.5 mg total) by mouth daily. 90 tablet 3  . temazepam (RESTORIL) 30 MG capsule Take 1 capsule (30 mg total) by mouth at bedtime as needed for sleep. 30 capsule 3  . warfarin (COUMADIN) 2 MG tablet Take 2 mg daily, further as directed by VAD clinic.     No current facility-administered medications for this encounter.     Demerol [meperidine] and Coconut oil  Review of systems complete and found to be negative unless listed in HPI.    Vital Signs:   Doppler Pressure: 80 Automatc BP:  96/55 (73) HR: 75 SPO2:  100%  Weight: 170 lb w/o eqt Last weight: 183.8 lb   Physical exam: General: Fatigued appearing. Frail  HEENT: normal  Neck: supple. JVP not elevated.  Carotids 2+ bilat; no bruits. No lymphadenopathy or thryomegaly appreciated. Cor: LVAD hum.  PICC line ok Lungs: Clear. Abdomen: soft, nontender, non-distended. No hepatosplenomegaly. No bruits or masses. Good bowel sounds. Driveline site clean. Anchor in place.  Extremities: no cyanosis, clubbing, rash. Warm no edema  + skin tenting Neuro: alert & oriented x 3. No focal deficits. Moves all 4 without problem     ASSESSMENT AND PLAN:   1. Chronic systolic HF with prominent RV failure: s/p HM-II LVAD 10/2014. s/p previous driveline [id number]. No further pump stops. RHC in 1/19 with mildly low output.  - Had refractory RV failure with persistent low flow alarms and NYHA IV symptoms. Dobutamine started on 11/24/18.  - I think RV failure improved on dobutamine but now declining due to issues surrounding bladder cancer. Weight down 10+ pounds in just a few weeks. NYHA IV - Volume status low. We will give 1L IVF in clinic and encouraged him to try to push po intake. - VAD speed 8400 - VAD interrogated personally. Parameters stable with dobutamine suppport  2.Ventricular  tachycardia: Underwent DC-CV 9/17, 08/17/17, and 04/01/18.  S/p emergent DC-CV in ER 07/09/18. S/p DC-CV in ER on 1/3 -  Recurrent VT on 1/12 at rate ~200 bpm. Failed ATP which sped up VT to ~300 bpm and then was shocked x 1 - Device reprogrammed to avoid shocks at home. VT zone 118-230 monitor only. No ATP. > 230 Shock - No recent events - Continue amio and sotalol.  - Keep K > 4.0 Mg > 2.0  3. Recurrent high-grade bladder tumor:  - S/p stone removal.  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  Had 2 BCG infusions with Dr. Gloriann Loan but discontinued due to inability to tolerate. - S/p repeat resection 7/19.  - s/p repeat bladder tumor excision in 11/19 with fast-growing tumor. C/b bilateral hydronephrosis and persistent hematuria requiring XRT. - now finshed XRT - no recurrent hematuria   4. CKD, stage III:  Had AKI in setting of obstructive uropathy and ATN. Required HD x1(11/2017).   - Stable. Cr 1.41 today. Despite obstructive uropathy and volume depletion -Not interested in nephrostomy tubes for hydronephrosis  5. LVAD:  - VAD interrogated personally. Parameters stable. Parameters stable. Low flows improved with dobutamine.  - Continue speed at 8400 - INR 1.8 Keep INR closer to 2.0 with bladder lesion. Discussed dosing with PharmD personally. - LDH 281  6. GIB:  s/p clipping of 2 jejunal AVMs in 4/16. Continue coumadin and plavix.(with h/o CVA). Admitted 4/18 with recurrent GIB. Transfused 2u. EGD/colon negative. Admitted 12./18. hgb 6.5, Transfused 5u. EGD with 2 AVMs treated with APC.  - 10/24/18 EGD showed one non-bleeding AVM with APC.  - 10/25/18 Capsule with 2 clear AVMs noted, one proximal duodenum and the other suspected jejunum. Refuses Octreotide due to cost.  - Hgb stable at 10.3. He denies black stools.   7. Atrial arrhythmias: Atrial fibrillation, atrial tachycardia. s/p multiple DC-CVs last 07/02/18.  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  - Continue amiodarone  and sotalol.   8. H/o amio-induced hypethyroidism:  Followed by Dr. Forde Dandy in past.  - Amio restarted due to recurrent atrial arrhythmias and VT.  - Follow.   9. Hypokalemia/hyperkalemia - K 4.5 today. Off lasix.   10. Protein-calorie malnutrition - encouraged po  intake   Total time spent 45 minutes. Over half that time spent discussing above. Need to follow closely. May be nearing time to discuss EOL wishes,.   Glori Bickers, MD  12:10 AM      Glori Bickers, MD  12:02 AM

## 2018-12-19 NOTE — Progress Notes (Signed)
  Radiation Oncology         [phone] ________________________________  Name: [name] MRN: [id number]  Date: 12/19/2018  DOB: 11-02-1941  End of Treatment Note  Diagnosis:   77 yo man with hematuria from recurrent lateral bladder wall cancer     Indication for treatment:  Curative       Radiation treatment dates:   10/30/2018 - 11/21/2017, 12/08/2018 - 12/15/2018  Site/dose:   Bladder, retx / 55 Gy in 22 fractions of 2.5 Gy  Beams/energy:   3D, photons / 6X, 15X  Narrative: The patient tolerated radiation treatment relatively well. He was admitted from 11/24/2018 - 11/27/2018 for failure to thrive and anemia. He experienced nocturia x2-6, dysuria, urgency, leakage, constipation, fluctuating ability to empty bladder and fatigue throughout treatment.   Plan: The patient has completed radiation treatment. The patient will return to radiation oncology clinic for routine followup in one month. I advised him to call or return sooner if he has any questions or concerns related to his recovery or treatment. ________________________________  Sheral Apley. Tammi Klippel, M.D.  This document serves as a record of services personally performed by [name] Pita, MD. It was created on his behalf by Wilburn Mylar, a trained medical scribe. The creation of this record is based on the scribe's personal observations and the provider's statements to them. This document has been checked and approved by the attending provider.

## 2018-12-23 ENCOUNTER — Ambulatory Visit (HOSPITAL_COMMUNITY): Payer: Self-pay | Admitting: Pharmacist

## 2018-12-23 LAB — POCT INR: INR: 2 (ref 2.0–3.0)

## 2018-12-30 ENCOUNTER — Ambulatory Visit (HOSPITAL_COMMUNITY): Payer: Self-pay | Admitting: Pharmacist

## 2018-12-30 LAB — POCT INR: INR: 1.6 — AB (ref 2–3)

## 2018-12-31 ENCOUNTER — Ambulatory Visit (INDEPENDENT_AMBULATORY_CARE_PROVIDER_SITE_OTHER): Payer: Medicare Other | Admitting: *Deleted

## 2018-12-31 DIAGNOSIS — I472 Ventricular tachycardia, unspecified: Secondary | ICD-10-CM

## 2018-12-31 DIAGNOSIS — I255 Ischemic cardiomyopathy: Secondary | ICD-10-CM

## 2019-01-01 LAB — CUP PACEART REMOTE DEVICE CHECK
Battery Remaining Longevity: 18 mo
Battery Voltage: 2.92 V
Brady Statistic AP VP Percent: 2.69 %
Brady Statistic AP VS Percent: 95.14 %
Brady Statistic AS VP Percent: 1.75 %
Brady Statistic AS VS Percent: 0.42 %
Brady Statistic RA Percent Paced: 97.07 %
Brady Statistic RV Percent Paced: 4.57 %
Date Time Interrogation Session: 20200226130743
HighPow Impedance: 34 Ohm
HighPow Impedance: 48 Ohm
Implantable Lead Implant Date: 20030714
Implantable Lead Implant Date: 20140801
Implantable Lead Location: 753859
Implantable Lead Location: 753860
Implantable Lead Model: 5592
Implantable Lead Model: 6944
Implantable Pulse Generator Implant Date: 20140721
Lead Channel Impedance Value: 361 Ohm
Lead Channel Impedance Value: 456 Ohm
Lead Channel Impedance Value: 551 Ohm
Lead Channel Pacing Threshold Amplitude: 0.875 V
Lead Channel Pacing Threshold Amplitude: 1.375 V
Lead Channel Pacing Threshold Pulse Width: 0.4 ms
Lead Channel Pacing Threshold Pulse Width: 0.4 ms
Lead Channel Sensing Intrinsic Amplitude: 1.375 mV
Lead Channel Sensing Intrinsic Amplitude: 1.375 mV
Lead Channel Sensing Intrinsic Amplitude: 2.625 mV
Lead Channel Setting Pacing Amplitude: 2 V
Lead Channel Setting Pacing Amplitude: 3.25 V
Lead Channel Setting Pacing Pulse Width: 0.4 ms
Lead Channel Setting Sensing Sensitivity: 0.3 mV
MDC IDC MSMT LEADCHNL RV SENSING INTR AMPL: 2.625 mV

## 2019-01-06 ENCOUNTER — Ambulatory Visit (HOSPITAL_COMMUNITY): Payer: Self-pay | Admitting: Pharmacist

## 2019-01-06 LAB — POCT INR: INR: 1.5 — AB (ref 2.0–3.0)

## 2019-01-07 ENCOUNTER — Encounter: Payer: Self-pay | Admitting: Cardiology

## 2019-01-07 NOTE — Progress Notes (Signed)
Remote ICD transmission.   

## 2019-01-13 ENCOUNTER — Ambulatory Visit (HOSPITAL_COMMUNITY): Payer: Self-pay | Admitting: Pharmacist

## 2019-01-13 LAB — POCT INR: INR: 1.9 — AB (ref 2.0–3.0)

## 2019-01-14 ENCOUNTER — Other Ambulatory Visit (HOSPITAL_COMMUNITY): Payer: Self-pay | Admitting: *Deleted

## 2019-01-14 DIAGNOSIS — Z95811 Presence of heart assist device: Secondary | ICD-10-CM

## 2019-01-14 DIAGNOSIS — I5020 Unspecified systolic (congestive) heart failure: Secondary | ICD-10-CM

## 2019-01-15 ENCOUNTER — Encounter: Payer: Self-pay | Admitting: Urology

## 2019-01-15 ENCOUNTER — Ambulatory Visit
Admission: RE | Admit: 2019-01-15 | Discharge: 2019-01-15 | Disposition: A | Discharge status: Home / Self Care | Payer: Medicare Other | Source: Ambulatory Visit | Attending: Urology | Admitting: Urology

## 2019-01-15 ENCOUNTER — Other Ambulatory Visit: Payer: Self-pay

## 2019-01-15 ENCOUNTER — Ambulatory Visit (HOSPITAL_BASED_OUTPATIENT_CLINIC_OR_DEPARTMENT_OTHER)
Admission: RE | Admit: 2019-01-15 | Discharge: 2019-01-15 | Disposition: A | Discharge status: Home / Self Care | Payer: Medicare Other | Source: Ambulatory Visit | Attending: Internal Medicine | Admitting: Internal Medicine

## 2019-01-15 VITALS — BP 100/71 | HR 73 | Ht 72.0 in | Wt 174.9 lb

## 2019-01-15 DIAGNOSIS — Z95811 Presence of heart assist device: Secondary | ICD-10-CM | POA: Insufficient documentation

## 2019-01-15 DIAGNOSIS — I130 Hypertensive heart and chronic kidney disease with heart failure and stage 1 through stage 4 chronic kidney disease, or unspecified chronic kidney disease: Secondary | ICD-10-CM | POA: Insufficient documentation

## 2019-01-15 DIAGNOSIS — Z923 Personal history of irradiation: Secondary | ICD-10-CM | POA: Insufficient documentation

## 2019-01-15 DIAGNOSIS — Z8673 Personal history of transient ischemic attack (TIA), and cerebral infarction without residual deficits: Secondary | ICD-10-CM

## 2019-01-15 DIAGNOSIS — Z7901 Long term (current) use of anticoagulants: Secondary | ICD-10-CM

## 2019-01-15 DIAGNOSIS — I5020 Unspecified systolic (congestive) heart failure: Secondary | ICD-10-CM | POA: Diagnosis not present

## 2019-01-15 DIAGNOSIS — Z7902 Long term (current) use of antithrombotics/antiplatelets: Secondary | ICD-10-CM | POA: Insufficient documentation

## 2019-01-15 DIAGNOSIS — I252 Old myocardial infarction: Secondary | ICD-10-CM

## 2019-01-15 DIAGNOSIS — M1990 Unspecified osteoarthritis, unspecified site: Secondary | ICD-10-CM | POA: Insufficient documentation

## 2019-01-15 DIAGNOSIS — R319 Hematuria, unspecified: Secondary | ICD-10-CM | POA: Insufficient documentation

## 2019-01-15 DIAGNOSIS — Z85820 Personal history of malignant melanoma of skin: Secondary | ICD-10-CM

## 2019-01-15 DIAGNOSIS — Z79890 Hormone replacement therapy (postmenopausal): Secondary | ICD-10-CM | POA: Insufficient documentation

## 2019-01-15 DIAGNOSIS — N183 Chronic kidney disease, stage 3 (moderate): Secondary | ICD-10-CM

## 2019-01-15 DIAGNOSIS — Z79899 Other long term (current) drug therapy: Secondary | ICD-10-CM | POA: Insufficient documentation

## 2019-01-15 DIAGNOSIS — E785 Hyperlipidemia, unspecified: Secondary | ICD-10-CM | POA: Insufficient documentation

## 2019-01-15 DIAGNOSIS — I472 Ventricular tachycardia: Secondary | ICD-10-CM

## 2019-01-15 DIAGNOSIS — I5022 Chronic systolic (congestive) heart failure: Secondary | ICD-10-CM | POA: Insufficient documentation

## 2019-01-15 DIAGNOSIS — I2510 Atherosclerotic heart disease of native coronary artery without angina pectoris: Secondary | ICD-10-CM

## 2019-01-15 DIAGNOSIS — E039 Hypothyroidism, unspecified: Secondary | ICD-10-CM | POA: Insufficient documentation

## 2019-01-15 DIAGNOSIS — C672 Malignant neoplasm of lateral wall of bladder: Secondary | ICD-10-CM

## 2019-01-15 DIAGNOSIS — D494 Neoplasm of unspecified behavior of bladder: Secondary | ICD-10-CM

## 2019-01-15 DIAGNOSIS — I471 Supraventricular tachycardia: Secondary | ICD-10-CM | POA: Insufficient documentation

## 2019-01-15 DIAGNOSIS — I480 Paroxysmal atrial fibrillation: Secondary | ICD-10-CM | POA: Insufficient documentation

## 2019-01-15 DIAGNOSIS — Z87442 Personal history of urinary calculi: Secondary | ICD-10-CM

## 2019-01-15 DIAGNOSIS — Z955 Presence of coronary angioplasty implant and graft: Secondary | ICD-10-CM | POA: Insufficient documentation

## 2019-01-15 DIAGNOSIS — I255 Ischemic cardiomyopathy: Secondary | ICD-10-CM | POA: Insufficient documentation

## 2019-01-15 DIAGNOSIS — E46 Unspecified protein-calorie malnutrition: Secondary | ICD-10-CM

## 2019-01-15 LAB — CBC
HEMATOCRIT: 33.6 % — AB (ref 39.0–52.0)
Hemoglobin: 10.5 g/dL — ABNORMAL LOW (ref 13.0–17.0)
MCH: 32.8 pg (ref 26.0–34.0)
MCHC: 31.3 g/dL (ref 30.0–36.0)
MCV: 105 fL — ABNORMAL HIGH (ref 80.0–100.0)
NRBC: 0 % (ref 0.0–0.2)
Platelets: 127 10*3/uL — ABNORMAL LOW (ref 150–400)
RBC: 3.2 MIL/uL — ABNORMAL LOW (ref 4.22–5.81)
RDW: 16.8 % — ABNORMAL HIGH (ref 11.5–15.5)
WBC: 6.1 10*3/uL (ref 4.0–10.5)

## 2019-01-15 LAB — BASIC METABOLIC PANEL
ANION GAP: 9 (ref 5–15)
BUN: 34 mg/dL — ABNORMAL HIGH (ref 8–23)
CO2: 24 mmol/L (ref 22–32)
Calcium: 8.9 mg/dL (ref 8.9–10.3)
Chloride: 104 mmol/L (ref 98–111)
Creatinine, Ser: 1.44 mg/dL — ABNORMAL HIGH (ref 0.61–1.24)
GFR calc Af Amer: 53 mL/min — ABNORMAL LOW (ref >60)
GFR calc non Af Amer: 46 mL/min — ABNORMAL LOW (ref >60)
Glucose, Bld: 107 mg/dL — ABNORMAL HIGH (ref 70–99)
Potassium: 4 mmol/L (ref 3.5–5.1)
Sodium: 137 mmol/L (ref 135–145)

## 2019-01-15 LAB — LACTATE DEHYDROGENASE: LDH: 233 U/L — ABNORMAL HIGH (ref 98–192)

## 2019-01-15 NOTE — Progress Notes (Signed)
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  He tolerated radiation treatment relatively well. He was admitted from 11/24/2018 - 11/27/2018 for failure to thrive and anemia. He experienced nocturia x2-6, dysuria, urgency, leakage, constipation, fluctuating ability to empty bladder and fatigue throughout treatment.                               On review of systems, the patient states that he is doing relatively well overall.  However, just yesterday he developed recurrent hematuria with bright red blood on voiding which has persisted today.  He denies passage of blood clots and has increased his fluid intake appropriately.  He denies any suprapubic pain, fever, chills, dysuria, or night sweats.  He has persistent increased frequency and urgency but overall feels that he is emptying his bladder well on voiding.  He does not currently have a scheduled follow-up visit with Dr. Gloriann Loan but feels that he is due for repeat cystoscopy in the next week or two and will call [facility] to inquire.  Otherwise, he is pleased with his progress to date.  ALLERGIES:  is allergic to demerol [meperidine] and coconut oil.  Meds: Current Outpatient Medications  Medication Sig Dispense Refill  . amiodarone (PACERONE) 200 MG tablet Take 1 tablet (200 mg total) by mouth 2 (two) times daily. 120 tablet 5  . atorvastatin (LIPITOR) 80 MG tablet Take 1 tablet (80 mg total) by  mouth at bedtime. 90 tablet 1  . DOBUTamine (DOBUTREX) 4-5 MG/ML-% infusion Inject 211.75 mcg/min into the vein continuous. 250 mL 12  . docusate sodium (COLACE) 100 MG capsule Take 100 mg by mouth daily.     [name] levothyroxine (SYNTHROID, LEVOTHROID) 150 MCG tablet TAKE 1 TABLET DAILY BEFORE BREAKFAST. 90 tablet 3  . midodrine (PROAMATINE) 10 MG tablet Take 1 tablet (10 mg total) by mouth 3 (three) times daily with meals. 90 tablet 3  . Multiple Vitamin (MULTIVITAMIN WITH MINERALS) TABS tablet Take 1 tablet by mouth daily.    . pantoprazole (PROTONIX) 40 MG tablet TAKE 1 TABLET TWICE DAILY 180 tablet 6  . sotalol (BETAPACE) 80 MG tablet Take 1 tablet (80 mg total) by mouth 2 (two) times daily. 60 tablet 6  . spironolactone (ALDACTONE) 25 MG tablet Take 0.5 tablets (12.5 mg total) by mouth daily. 90 tablet 3  . temazepam (RESTORIL) 30 MG capsule Take 1 capsule (30 mg total) by mouth at bedtime as needed for sleep. 30 capsule 3  . warfarin (COUMADIN) 2 MG tablet Take 2 mg daily, further as directed by VAD clinic. (Patient taking differently: No sig reported)     No current facility-administered medications for this encounter.     Physical Findings:  weight is 175 lb 3.2 oz (79.5 kg). His oral temperature is 97.6 F (36.4 C). His blood pressure is 80/67 (abnormal) and his  pulse is 74. His respiration is 18.  Pain Assessment Pain Score: 0-No pain/10 In general this is a well appearing Caucasian male in no acute distress.  He's alert and oriented x4 and appropriate throughout the examination. Cardiopulmonary assessment is negative for acute distress and he exhibits normal effort.   Lab Findings: Lab Results  Component Value Date   WBC 6.1 01/15/2019   HGB 10.5 (L) 01/15/2019   HCT 33.6 (L) 01/15/2019   MCV 105.0 (H) 01/15/2019   PLT 127 (L) 01/15/2019     Radiographic Findings: No results found.  Impression/Plan: 12. 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 He appears to have  recovered well from the effects of his recent radiotherapy.  He will continue in routine follow-up with Dr. Gloriann Loan for repeat surveillance cystoscopies.  He has met with Dr. Alen Blew to discuss the potential for systemic therapy and the decision was to reserve systemic therapy in case he develops metastatic disease in the future.  We discussed that while we are happy to continue to participate in his care if clinically indicated, at this point, we will plan to see him back on an as-needed basis.  He knows to call at anytime with any questions or concerns related to his previous radiotherapy.   2. Gross hematuria.  He has had a recent recurrence of the gross hematuria but denies any passage of clots or difficulty emptying his bladder.  He was advised to continue pushing fluids to prevent the potential for clot retention and he has a routine follow-up visit with his cardiologist later this morning for lab work to monitor for blood loss anemia.  Currently, he denies significant fatigue or weakness.  I have also encouraged him to call [facility] to schedule a follow-up visit with Dr. Gloriann Loan, sooner than later, for further evaluation should the hematuria persist.  He appears to have a good understanding and is in agreement with the stated plan.    Nicholos Johns, PA-C

## 2019-01-15 NOTE — Progress Notes (Signed)
Patient presents for three week  f/u  in [facility] today with caregiver, [name]. Pt has multiple low flows from 3/2-3/6 but none since.  Pt reports that he has had blood in his urine since 3/10. Dr. Haroldine Laws called Dr. Tammi Klippel who states that the pt has completed radiation for the bladder cancer and they are unable to offer the pt anymore radiation. Dr. Gloriann Loan was called and he will see the pt tomorrow in clinic for a cystoscopy. Pt will need to hold his Coumadin tonight.    Vital Signs:   Doppler Pressure: 82 Automatc BP:  100/71 (81) HR: 73 SPO2: 98%  Weight: 174.9 lb w/o eqt Last weight: 170 lb   VAD Indication: Destination Therapy- age excluding    VAD interrogation & Equipment Management: Speed: 8400 Flow:  2.9 Power: 3.8w    PI: 6.5  Alarms: multiple low flows from 3/2-3/6 Events: rare  Fixed speed 8400 Low speed limit: 8000  Primary Controller: Replace back up battery in 21 months Back up controller: Replace back up battery in 74months  Exit Site Care: Drive line is being maintained weekly by [name]. Drive line exit site well healed and incorporated. The velour is fully implanted at exit site. Dressing dry and intact. No erythema or drainage. Stabilization device present and accurately applied. Pt denies fever or chills. Pt given 4 weekly kits in clinic.    Significant Events on VAD Support:  02/2015 >>GIB (removed ASA, 2-2.5 INR) 10/2015 >>CVA(Plavix started) 02/2017> GIB (removed ASA, scopes negative) 03/2017> percutaneous lead repair 12/2017> Bladder CA  Device:Medtronic Therapies: on 230 bpm AT monitor: 118-230 bpm Last check: today-Brandon over to reprogram with above settings per DR. Bensimhon   BP & Labs:  MAP 82 - Doppler is reflecting MAP  Hgb 10.5 - Denies any signs/symptoms of bleeding or nosebleeds.   LDH 233- with established baseline of 280-380. Denies tea-colored urine. No power elevations noted on interrogation.    Patient  Instructions: 1. No change in meds. 2. Return to clinic in 3 weeks unless Dr. Gloriann Loan wants to perform a tumor resection in the OR then we will coordinate an admission.   Tanda Rockers, RN VAD Coordinator  Office: [phone]  24/7 Pager: [phone]

## 2019-01-16 NOTE — Progress Notes (Addendum)
Patient ID: [name], male   DOB: Mar 30, 1941, 78 y.o.   MRN: [id number]   VAD CLINIC PROGRESS NOTE  PCP: Dr. Kandice Robinsons ([place]) GU: Dr Gloriann Loan Neuro Surgeon: Dr Vertell Limber Vascular: Dr Kellie Simmering Primary HF: Dr Darlyn Read [name] is a 77 y.o. male  with h/o VT, PAD and severe systolic HF (EF 95-62%) due to mixed ischemic/nonischemic CM he is s/p HM II LVAD implant for destination therapy on October 19, 2013.VAD implantation was complicated by VT, syncope, AF/Aflutter and RHF. Required short term milrinone.   GU Event 09/01/14 had impacted ureteral stone and underwent cystoscopy and flexible ureteroscopy with lithotripsy and basket extraction.  09/2015 Hematuria and chronic nephrolithiasis. Renal US showed mild hyronephrosis and bilateral renal cysts. Dr. Risa [name] took back for repeat lithotripsy and stone extraction by ureteroscopy and J stent placement. 12/18 Recurrent hematuria. 37mm left ureteral stone. Passed with tamulosin 1/19   Admitted with recurrent hematuria with ureteral stone. Stone removed but found to have large bladder mass which was removed. Course c/b obstructive uropathy and AKI requiring 1 session of HD.  3/19 Bladder chemo stopped after 2 treatments as unable to tolerate 05/16/18 s/p TURB with right ureteral stent placement.  09/2018 Recurrent bladder tumor excision with persistent hematuria requiring XRT  GI Event  02/2015 - Hgb 5.4 --> EGD that showed 2 AVMs in the jejunum that were clipped.  4/2-18 - Episode of melena. Hgb 10.9-> 8.7. EGD/colonoscopy negative 12/18 - Melena with syncope  Hgb 6.5. Transfused 5u. EGD with 2 AVMs with APC 10/24/18 EGD showed one non-bleeding AVM with APC.  10/25/18 Capsule results delayed due to software upgrade and holiday.    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  01/2016 R CEA. He was started on Plavix.  01/2016 Lumbar fusion L3-L5 with pedicle screws posterolateral arthrodesis with BMP, allograft  VT events 9/17  with high fevers and productive cough. Treated with levaquin x 7 days for acute bronchitis. Also found to have VT and underwent DC-CV. Seen by EP and amio increased 10/17 speed turned down to 8800 due to RV failure. 10/18 with slow VT and ADHF.Cardioverted in ED and amio uptitrated.  04/01/18 recurrent VT. Underwent DC-CV in Er[facility]9/4/19 presented to ED. Urgently cardioverted. EP started on Sotolol.  11/07/18 Recurrent VT - DC-CV in ER 11/16/18 VT - ICD shock at home  Significant VAD Events  03/2017>percutaneous lead repair  11/24/18 Started on dobutamine for RV failure   Here for regular VAD f/u: Admitted 11/24/18 for dobutamine initiation due to refractory RV failure and persistent low flow alarms. Remains on dobutamine 2.5. Recently finished full-dose palliative XRT for hematuria due to high-grade bladder tumor. Feels a bit better on home dobutamine but still weak. Able to do ADLs. Appetite improved. No edema. Started having hematuria a a day or two ago. No fevers, chills or problems with driveline. No melena or neuro symptoms. Had low flow VAD alarms on 3/6. Taking all meds as prescribed.    VAD Indication: Destination Therapy- age excluding    VAD interrogation & Equipment Management: Speed: 8400 Flow:  2.9 Power: 3.8w PI: 6.5  Alarms: multiple low flows from 3/2-3/6 Events: rare  Fixed speed 8400 Low speed limit: 8000  Primary Controller: Replace back up battery in 21 months Back up controller: Replace back up battery in 24months   Past Medical History:  Diagnosis Date  . Arthritis   . Automatic implantable cardioverter-defibrillator in situ    a. s/p Medtronic Evera device serial #  [id number]    . Bradycardia   . CAD (coronary artery disease)    a. s/p MI 1982;  s/p DES to CFX 2011;  s/p NSTEMI 4/12 in setting of VTach;  cath 7/12:  LM ok, LAD mid 30-40%, Dx's with 40%, mCFX stent patent, prox to mid RCA occluded with R to R and L to R collats.  Medical Tx was  continued  . Chronic systolic heart failure ([place])    a. s/p LVAD 10/2013 for destination therapy  . CKD (chronic kidney disease)   . CVD (cerebrovascular disease)    a. s/p L CEA 2008  . Cytopenia   . Dysrhythmia    AFIB  . History of GI bleed    a. on ASA- started on plavix during 10/2015 admission for CVA  . HLD (hyperlipidemia)   . HTN (hypertension)   . Hypothyroidism   . Inappropriate therapy from implantable cardioverter-defibrillator    a. ATP for sinus tach SVT wavelet identified SVT but was no  passive (2014)  . Ischemic cardiomyopathy   . 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 L Ear   . Myocardial infarction ([place])    1992  . PAF (paroxysmal atrial fibrillation) ([place])   . PVD (peripheral vascular disease) ([place])    a. 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 Shortness of breath dyspnea    W/ EXERTION   . Sleep apnea    NO CPAP NOW  HE SENT IT BACK YRS AGO  . Stroke [facility])    a. 10/2015 admission   . Ventricular tachycardia ([place])    a. s/p AICD;   h/o ICD shock;  Amiodarone Rx. S/P ablation in 2012    Current Outpatient Medications  Medication Sig Dispense Refill  . amiodarone (PACERONE) 200 MG tablet Take 1 tablet (200 mg total) by mouth 2 (two) times daily. 120 tablet 5  . atorvastatin (LIPITOR) 80 MG tablet Take 1 tablet (80 mg total) by mouth at bedtime. 90 tablet 1  . DOBUTamine (DOBUTREX) 4-5 MG/ML-% infusion Inject 211.75 mcg/min into the vein continuous. 250 mL 12  . docusate sodium (COLACE) 100 MG capsule Take 100 mg by mouth daily.     [name] levothyroxine (SYNTHROID, LEVOTHROID) 150 MCG tablet TAKE 1 TABLET DAILY BEFORE BREAKFAST. 90 tablet 3  . midodrine (PROAMATINE) 10 MG tablet Take 1 tablet (10 mg total) by mouth 3 (three) times daily with meals. 90 tablet 3  . Multiple Vitamin (MULTIVITAMIN WITH MINERALS) TABS tablet Take 1 tablet by mouth daily.    . pantoprazole (PROTONIX)  40 MG tablet TAKE 1 TABLET TWICE DAILY 180 tablet 6  . sotalol (BETAPACE) 80 MG tablet Take 1 tablet (80 mg total) by mouth 2 (two) times daily. 60 tablet 6  . spironolactone (ALDACTONE) 25 MG tablet Take 0.5 tablets (12.5 mg total) by mouth daily. 90 tablet 3  . temazepam (RESTORIL) 30 MG capsule Take 1 capsule (30 mg total) by mouth at bedtime as needed for sleep. 30 capsule 3  . warfarin (COUMADIN) 2 MG tablet Take 2 mg daily, further as directed by VAD clinic. (Patient taking differently: No sig reported)     No current facility-administered medications for this encounter.     Demerol [meperidine] and Coconut oil  Review of systems complete and found to be negative unless listed in HPI.  Vital Signs:   Doppler Pressure: 82 Automatc BP:  100/71 (81) HR: 73 SPO2: 98%  Weight: 174.9 lb w/o eqt Last weight: 170 lb    Physical exam: General:  Weak appearing NAD.  HEENT: normal  Neck: supple. JVP not elevated.  Carotids 2+ bilat; no bruits. No lymphadenopathy or thryomegaly appreciated. Tunneled PICC with dobutamie Cor: LVAD hum.  Lungs: Clear. Abdomen:  soft, nontender, non-distended. No hepatosplenomegaly. No bruits or masses. Good bowel sounds. Driveline site clean. Anchor in place.  Extremities: no cyanosis, clubbing, rash. Warm no edema  Neuro: alert & oriented x 3. No focal deficits. Moves all 4 without problem    ASSESSMENT AND PLAN:  1. 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  Had 2 BCG infusions with Dr. Gloriann Loan but discontinued due to inability to tolerate. - S/p repeat resection 7/19.  - s/p repeat bladder tumor excision in 11/19 with fast-growing tumor. C/b bilateral hydronephrosis and persistent hematuria requiring XRT. - now finshed XRT - now with recurrent hematuria I discussed with Dr. Tammi Klippel in Oncology and not candidate for any further XRT - I also d/w Dr. Gloriann Loan and  Urology and will plan for repeat cystoscopy tomorrow am. Hold coumadin tonight. If needs repeat resection will need to admit for heparin bridge   2. Chronic systolic HF with prominent RV failure: s/p HM-II LVAD 10/2014. s/p previous driveline [id number]. No further pump stops. RHC in 1/19 with mildly low output.  - Had refractory RV failure with persistent low flow alarms and NYHA IV symptoms. Dobutamine started on 11/24/18.  - RV failure improved on dobutamine bu still with occasional low flows.  - NYHA IIIB - Continue to push po intake. - VAD speed 8400 - VAD interrogated personally. Parameters stable with dobutamine suppport  3.Ventricular tachycardia: Underwent DC-CV 9/17, 08/17/17, and 04/01/18.  S/p emergent DC-CV in ER 07/09/18. S/p DC-CV in ER on 1/3 -  Recurrent VT on 1/12 at rate ~200 bpm. Failed ATP which sped up VT to ~300 bpm and then was shocked x 1 - Device reprogrammed to avoid shocks at home. VT zone 118-230 monitor only. No ATP. > 230 Shock - No recent events - Continue amio and sotalol.  - Keep K > 4.0 Mg > 2.0  4. CKD, stage III:  Had AKI in setting of obstructive uropathy and ATN. Required HD x1(11/2017).   - Stable. Cr 1.44 today. - Previously not interested in nephrostomy tubes for hydronephrosis  5. LVAD:  - VAD interrogated personally. Still with some low flows despite dobutamine but these have improved - Continue speed at 8400 - INR 1.9 Keep INR closer to 2.0 with bladder lesion and hematuria. Discussed dosing with PharmD personally. - LDH 233  6. GIB:  s/p clipping of 2 jejunal AVMs in 4/16. Continue coumadin and plavix.(with h/o CVA). Admitted 4/18 with recurrent GIB. Transfused 2u. EGD/colon negative. Admitted 12./18. hgb 6.5, Transfused 5u. EGD with 2 AVMs treated with APC.  - 10/24/18 EGD showed one non-bleeding AVM with APC.  - 10/25/18 Capsule with 2 clear AVMs noted, one proximal duodenum and the other suspected jejunum. Refuses Octreotide due to cost.   - Hgb stable at 10.5 He denies black stools.   7. Atrial arrhythmias: Atrial fibrillation, atrial tachycardia. s/p multiple DC-CVs last 07/02/18.  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  - Continue amiodarone and sotalol.   8. H/o amio-induced hypethyroidism:  Followed by Dr. Forde Dandy in past.  -  Amio restarted due to recurrent atrial arrhythmias and VT.  - Follow.   9. Hypokalemia/hyperkalemia - K 4.0 today. Off lasix.   10. Protein-calorie malnutrition - encouraged po intake   Total time spent 45 minutes. Over half that time spent discussing above.    Glori Bickers, MD  10:52 AM

## 2019-01-20 ENCOUNTER — Ambulatory Visit (HOSPITAL_COMMUNITY): Payer: Self-pay | Admitting: Pharmacist

## 2019-01-20 ENCOUNTER — Telehealth (HOSPITAL_COMMUNITY): Payer: Self-pay | Admitting: Licensed Clinical Social Worker

## 2019-01-20 LAB — POCT INR: INR: 2 (ref 2–3)

## 2019-01-20 NOTE — Telephone Encounter (Signed)
CSW contacted patient to assure food, medications and confirmation of VAD pager number if concerns or emergency arise. Patient instructed to stay home and use of proper hygiene and call if needed.  Patient informed that VAD team will call the day before any upcoming appointments with instructions due to current CoVid 19 outbreak. Patient verbalizes understanding and denies any current concerns. Jackie Eila Runyan, LCSW, CCSW-MCS [phone]  

## 2019-01-27 ENCOUNTER — Ambulatory Visit (HOSPITAL_COMMUNITY): Payer: Self-pay | Admitting: Pharmacist

## 2019-01-27 DIAGNOSIS — I130 Hypertensive heart and chronic kidney disease with heart failure and stage 1 through stage 4 chronic kidney disease, or unspecified chronic kidney disease: Secondary | ICD-10-CM | POA: Diagnosis not present

## 2019-01-27 DIAGNOSIS — I5023 Acute on chronic systolic (congestive) heart failure: Secondary | ICD-10-CM

## 2019-01-27 LAB — POCT INR: INR: 1.9 — AB (ref 2.0–3.0)

## 2019-02-03 ENCOUNTER — Ambulatory Visit (HOSPITAL_COMMUNITY): Payer: Self-pay | Admitting: Pharmacist

## 2019-02-03 LAB — POCT INR: INR: 2.3 (ref 2–3)

## 2019-02-05 ENCOUNTER — Inpatient Hospital Stay (HOSPITAL_COMMUNITY): Admission: RE | Admit: 2019-02-05 | Payer: Medicare Other | Source: Ambulatory Visit

## 2019-02-05 ENCOUNTER — Telehealth (HOSPITAL_COMMUNITY): Payer: Self-pay | Admitting: Licensed Clinical Social Worker

## 2019-02-05 NOTE — Telephone Encounter (Signed)
CSW contacted patient/caregiver to follow up on status with coronavirus and if anything needed. Patient instructed to stay home and use of proper hygiene and call if needed. Patient denies any concerns at this time and verbalizes understanding of process for contacting VAD Coordinators if needed. CSW continues to follow as needed. Jackie Fallan Mccarey, LCSW, CCSW-MCS [phone] 

## 2019-02-10 ENCOUNTER — Ambulatory Visit (HOSPITAL_COMMUNITY): Payer: Self-pay | Admitting: Pharmacist

## 2019-02-10 LAB — POCT INR: INR: 2.5 (ref 2–3)

## 2019-02-17 ENCOUNTER — Ambulatory Visit (HOSPITAL_COMMUNITY): Payer: Self-pay | Admitting: Pharmacist

## 2019-02-17 LAB — POCT INR: INR: 2.4 (ref 2–3)

## 2019-02-20 ENCOUNTER — Encounter (HOSPITAL_COMMUNITY): Payer: Self-pay | Admitting: Adult Health

## 2019-02-20 NOTE — Progress Notes (Signed)
LVAD coordinator contacted by [name] for recurrent hematuria and pain,.   Discussed with Dr Haroldine Laws. Suspect he is obstructed again. Offered hospital admit however Mr Hanner wants to remain at home and transition to hospice/comfrot care.   Mr Gomes also wants to stop dobutamine and deactivate ICD. We discussed that we will discontinue dobutamine after he received pain medication.   I have called [facility] and discussed transition to Hospice for end stage bladder cancer. Comfort kit orders signed and faxed back. I contacted [employer] for home ICD deactivation.   LVAD coordinators and Dr Haroldine Laws aware of above.     Myriah Boggus NP-C  10:34 AM

## 2019-02-24 ENCOUNTER — Ambulatory Visit (HOSPITAL_COMMUNITY): Payer: Self-pay | Admitting: Pharmacist

## 2019-02-24 LAB — POCT INR: INR: 7 — AB (ref 2–3)

## 2019-02-25 ENCOUNTER — Telehealth: Payer: Self-pay | Admitting: Licensed Clinical Social Worker

## 2019-02-27 ENCOUNTER — Telehealth: Payer: Self-pay | Admitting: Internal Medicine

## 2019-02-27 NOTE — Telephone Encounter (Signed)
Received DC from [facility], signed by Dr Haroldine Laws and faxed back to them at [phone]

## 2019-02-27 NOTE — Telephone Encounter (Signed)
[name] is needing a Death Certificate signed, she sent by fax She needs a confirmation that someone got it   Please call

## 2019-02-27 NOTE — Telephone Encounter (Signed)
I spoke to [place] from medical records and she will call [facility].

## 2019-03-04 ENCOUNTER — Encounter (HOSPITAL_COMMUNITY): Payer: Medicare Other

## 2019-03-06 ENCOUNTER — Telehealth (HOSPITAL_COMMUNITY): Payer: Self-pay | Admitting: Licensed Clinical Social Worker

## 2019-03-06 NOTE — Telephone Encounter (Signed)
CSW called [name] SO to offer support and grief information. No answer and message left for return call. [name], [place], [place]

## 2019-03-06 NOTE — Telephone Encounter (Signed)
CSW contacted patient's SO [name] to offer support. [name] shared that "he was ready" and shared his final moments and stated "it was time". She spoke of good memories during the past few years with the LVAD team and Support Group. [name] gave permission to share with the Support Group via email. CSW offered support and encouraged [name] to return call if needed and the team will be available. [name], [place], [place]

## 2019-03-06 DEATH — deceased

## 2019-03-16 ENCOUNTER — Encounter: Payer: Medicare Other | Admitting: Internal Medicine

## 2019-03-23 ENCOUNTER — Telehealth: Payer: Self-pay | Admitting: Licensed Clinical Social Worker

## 2019-03-23 NOTE — Telephone Encounter (Signed)
CSW contacted patient's SO [name] to provide support and encouraged her to attend the virtual support group. [name] stated she is back to work and "it's helping". CSW provided supportive listening. CSW available if needed. [name], [place], [place]

## 2019-07-22 ENCOUNTER — Telehealth (HOSPITAL_COMMUNITY): Payer: Self-pay

## 2019-07-22 NOTE — Telephone Encounter (Signed)
Faxed a Standing Order Request to [facility] to [phone], on 07/22/2019.

## 2019-08-07 ENCOUNTER — Telehealth (HOSPITAL_COMMUNITY): Payer: Self-pay | Admitting: Licensed Clinical Social Worker

## 2019-08-07 NOTE — Telephone Encounter (Signed)
CSW contacted caregiver to remind of virtual VAD support group on Monday. [name], [place], [place]

## 2020-08-15 IMAGING — US US RENAL
1 series · 14 of 25 positions shown · non-contrast
Comparison: 09/18/2018

CLINICAL DATA: Recent bladder tumor debulking, evaluate for
possible hydronephrosis

EXAM:
RENAL / URINARY TRACT ULTRASOUND COMPLETE

[Series 1: us renal · 0.26mm/px · 14 of 36 slices shown]
[im 1/36]
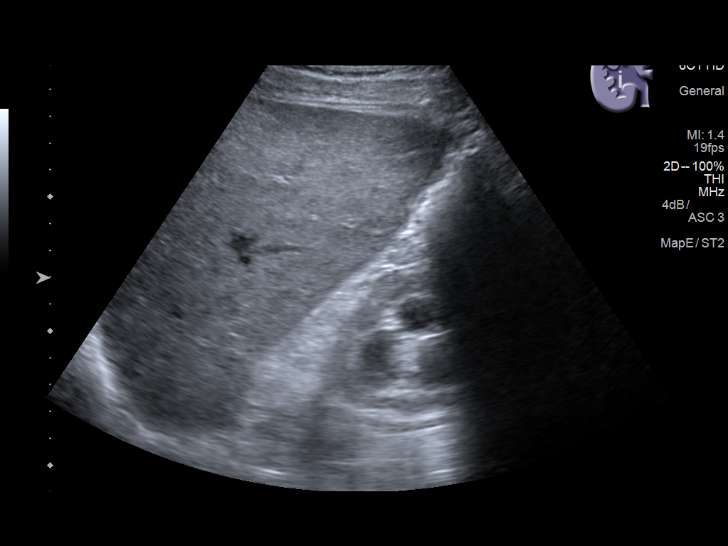
[im 3/36]
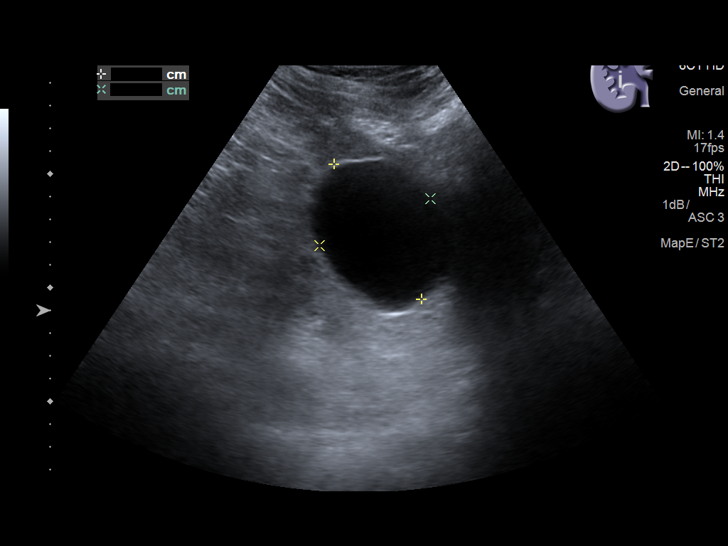
[im 6/36]
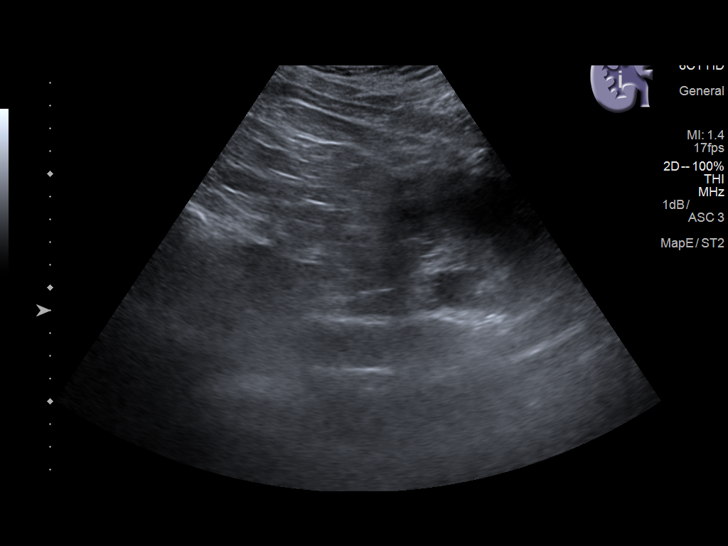
[im 9/36]
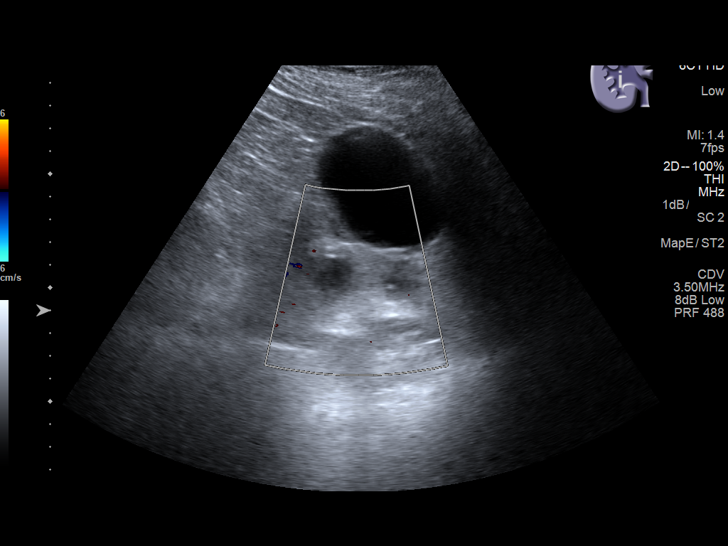
[im 12/36]
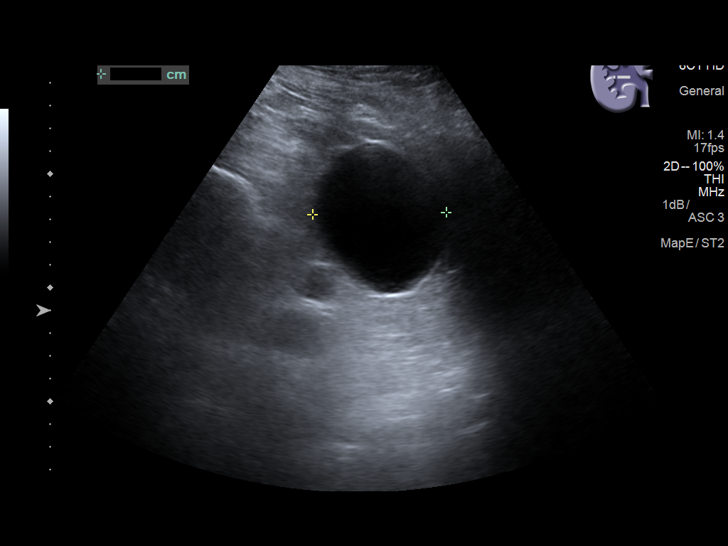
[im 14/36]
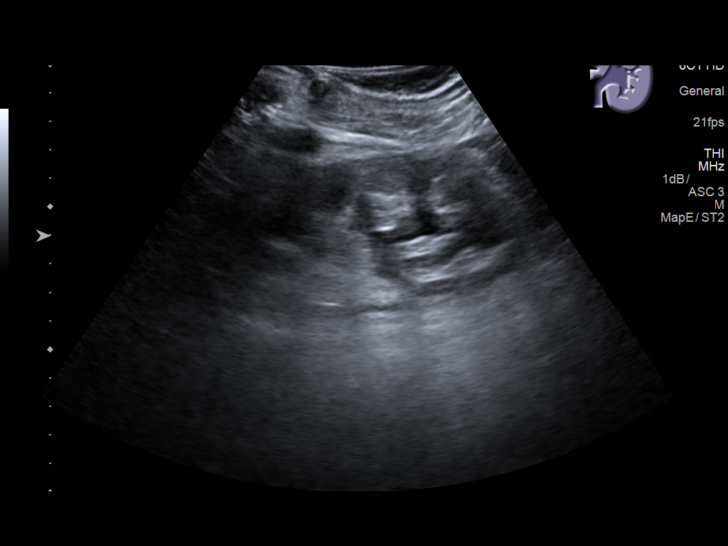
[im 17/36]
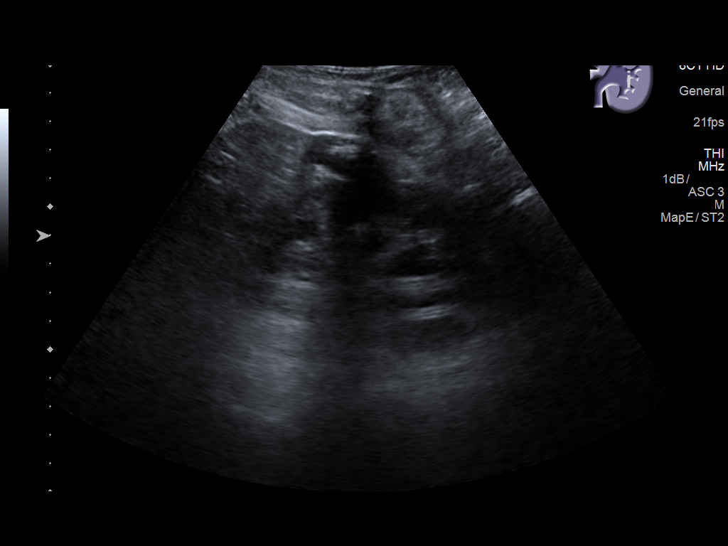
[im 19/36]
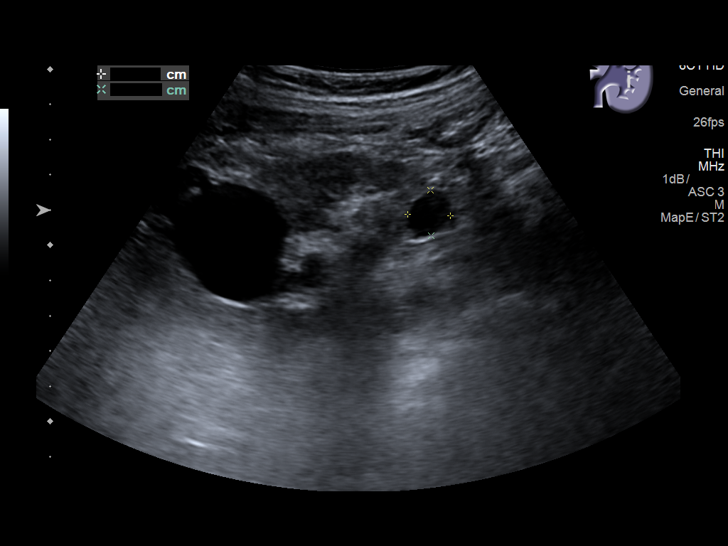
[im 22/36]
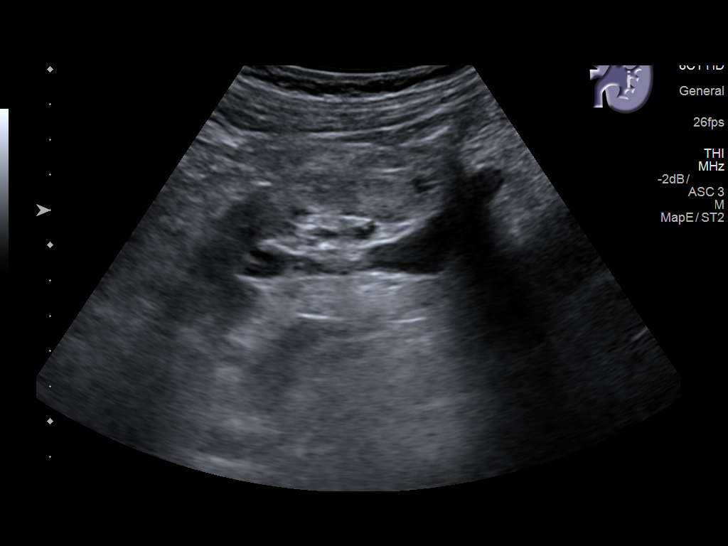
[im 24/36]
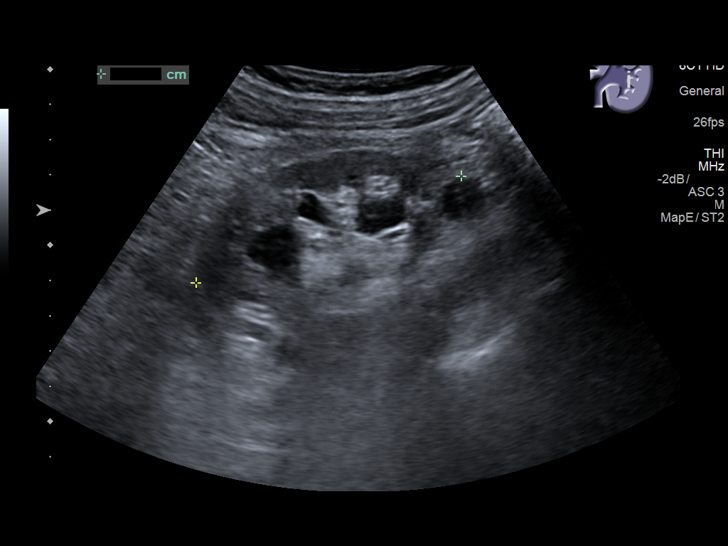
[im 27/36]
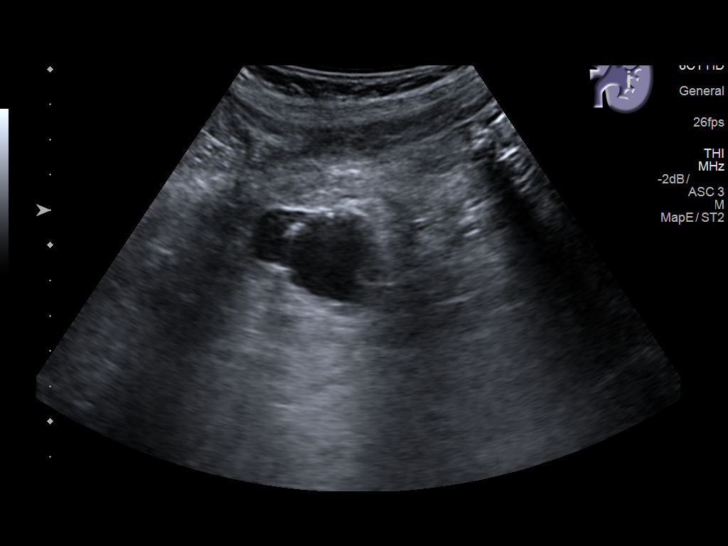
[im 30/36]
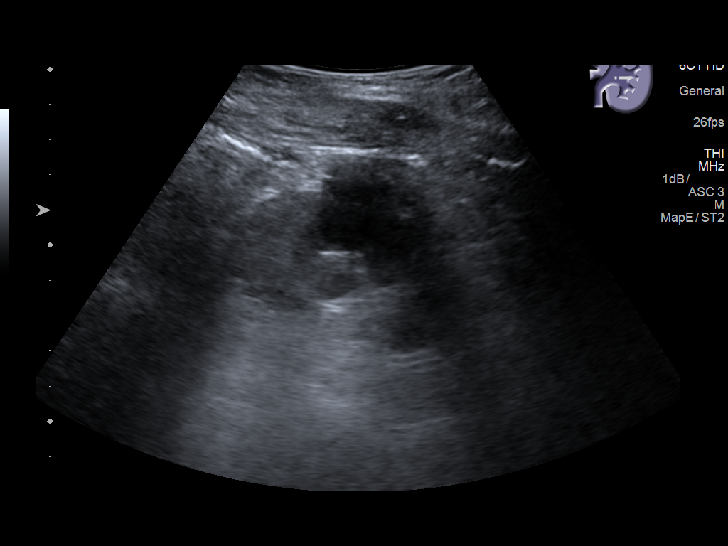
[im 33/36]
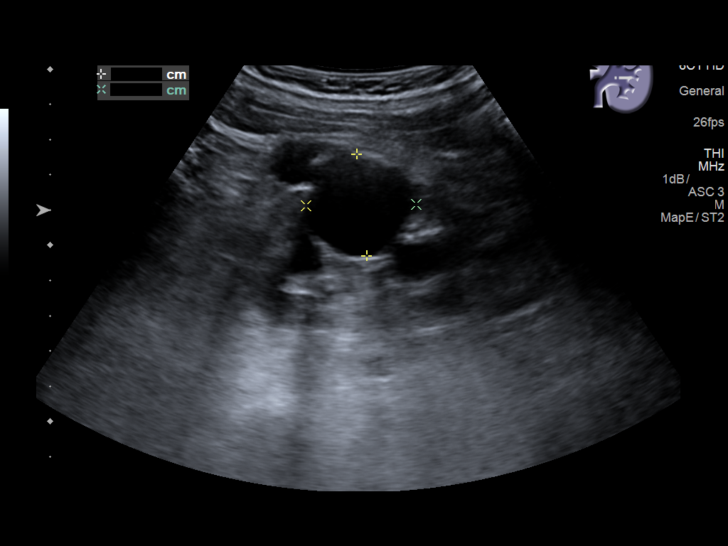
[im 36/36]
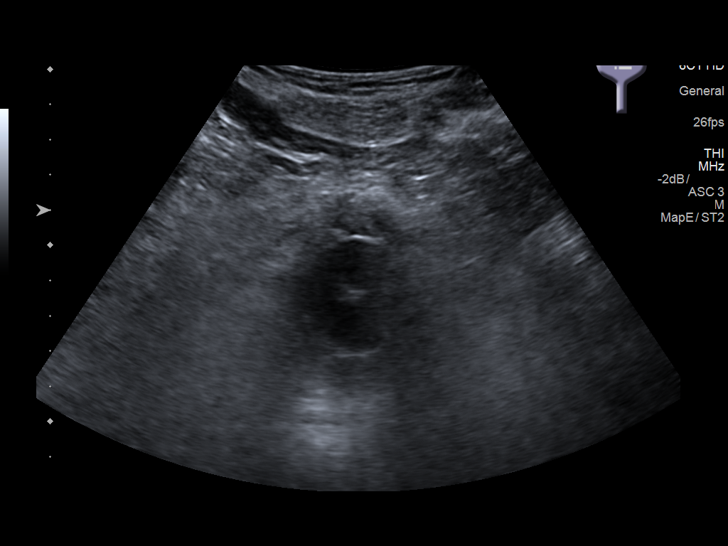

[14 of 25 positions shown; findings below may reference images not displayed]

FINDINGS: Right Kidney:

Renal measurements: 10.9 x 7.0 x 8.0 cm = volume: 319 mL. Moderate
hydronephrosis is noted. Large renal cyst is noted measuring 7.1 cm.

Left Kidney:

Renal measurements: 8.1 x 4.5 x 6.7 = volume: 128 mL. Mild left
hydronephrosis is noted less than that seen on the right. Two cysts
are noted on the left. The largest of these measures 3.4 cm.

Bladder:

Decompressed by Foley catheter.
IMPRESSION: Bilateral hydronephrosis right greater than left the right-sided
hydronephrosis is slightly improved from the prior exam. The left
hydronephrosis is increased from the prior study.

Stable renal cysts.
